# Patient Record
Sex: Male | Born: 1970 | Race: Black or African American | Hispanic: No | Marital: Married | Smoking: Never smoker
Health system: Southern US, Community
[De-identification: ages and names within clinical notes are randomized; demographics above are authoritative.]

## PROBLEM LIST (undated history)

## (undated) DIAGNOSIS — I4891 Unspecified atrial fibrillation: Secondary | ICD-10-CM

## (undated) DIAGNOSIS — L0390 Cellulitis, unspecified: Secondary | ICD-10-CM

## (undated) DIAGNOSIS — K219 Gastro-esophageal reflux disease without esophagitis: Secondary | ICD-10-CM

## (undated) DIAGNOSIS — K2970 Gastritis, unspecified, without bleeding: Secondary | ICD-10-CM

## (undated) DIAGNOSIS — K529 Noninfective gastroenteritis and colitis, unspecified: Secondary | ICD-10-CM

## (undated) DIAGNOSIS — N186 End stage renal disease: Secondary | ICD-10-CM

## (undated) DIAGNOSIS — M5030 Other cervical disc degeneration, unspecified cervical region: Secondary | ICD-10-CM

## (undated) DIAGNOSIS — N400 Enlarged prostate without lower urinary tract symptoms: Secondary | ICD-10-CM

## (undated) DIAGNOSIS — I509 Heart failure, unspecified: Secondary | ICD-10-CM

## (undated) DIAGNOSIS — I619 Nontraumatic intracerebral hemorrhage, unspecified: Secondary | ICD-10-CM

## (undated) DIAGNOSIS — I3139 Other pericardial effusion (noninflammatory): Secondary | ICD-10-CM

## (undated) DIAGNOSIS — Z7902 Long term (current) use of antithrombotics/antiplatelets: Secondary | ICD-10-CM

## (undated) DIAGNOSIS — E785 Hyperlipidemia, unspecified: Secondary | ICD-10-CM

## (undated) DIAGNOSIS — N189 Chronic kidney disease, unspecified: Secondary | ICD-10-CM

## (undated) DIAGNOSIS — Z79899 Other long term (current) drug therapy: Secondary | ICD-10-CM

## (undated) DIAGNOSIS — I639 Cerebral infarction, unspecified: Secondary | ICD-10-CM

## (undated) DIAGNOSIS — K209 Esophagitis, unspecified without bleeding: Secondary | ICD-10-CM

## (undated) DIAGNOSIS — E119 Type 2 diabetes mellitus without complications: Secondary | ICD-10-CM

## (undated) DIAGNOSIS — I219 Acute myocardial infarction, unspecified: Secondary | ICD-10-CM

## (undated) DIAGNOSIS — Z9001 Acquired absence of eye: Secondary | ICD-10-CM

## (undated) DIAGNOSIS — I6789 Other cerebrovascular disease: Secondary | ICD-10-CM

## (undated) DIAGNOSIS — R002 Palpitations: Secondary | ICD-10-CM

## (undated) DIAGNOSIS — Z7901 Long term (current) use of anticoagulants: Secondary | ICD-10-CM

## (undated) DIAGNOSIS — Z992 Dependence on renal dialysis: Secondary | ICD-10-CM

## (undated) DIAGNOSIS — M25572 Pain in left ankle and joints of left foot: Secondary | ICD-10-CM

## (undated) DIAGNOSIS — Z87442 Personal history of urinary calculi: Secondary | ICD-10-CM

## (undated) DIAGNOSIS — G8929 Other chronic pain: Secondary | ICD-10-CM

## (undated) DIAGNOSIS — E1140 Type 2 diabetes mellitus with diabetic neuropathy, unspecified: Secondary | ICD-10-CM

## (undated) DIAGNOSIS — R569 Unspecified convulsions: Secondary | ICD-10-CM

## (undated) DIAGNOSIS — R2681 Unsteadiness on feet: Secondary | ICD-10-CM

## (undated) DIAGNOSIS — I499 Cardiac arrhythmia, unspecified: Secondary | ICD-10-CM

## (undated) DIAGNOSIS — K3184 Gastroparesis: Secondary | ICD-10-CM

## (undated) DIAGNOSIS — D631 Anemia in chronic kidney disease: Secondary | ICD-10-CM

## (undated) DIAGNOSIS — M869 Osteomyelitis, unspecified: Secondary | ICD-10-CM

## (undated) DIAGNOSIS — R296 Repeated falls: Secondary | ICD-10-CM

## (undated) DIAGNOSIS — I10 Essential (primary) hypertension: Secondary | ICD-10-CM

## (undated) DIAGNOSIS — R112 Nausea with vomiting, unspecified: Secondary | ICD-10-CM

## (undated) DIAGNOSIS — I2510 Atherosclerotic heart disease of native coronary artery without angina pectoris: Secondary | ICD-10-CM

## (undated) DIAGNOSIS — G4700 Insomnia, unspecified: Secondary | ICD-10-CM

## (undated) DIAGNOSIS — M25571 Pain in right ankle and joints of right foot: Secondary | ICD-10-CM

## (undated) DIAGNOSIS — F419 Anxiety disorder, unspecified: Secondary | ICD-10-CM

## (undated) HISTORY — DX: Type 2 diabetes mellitus without complications: E11.9

## (undated) HISTORY — DX: Chronic kidney disease, unspecified: N18.9

## (undated) HISTORY — PX: EYE SURGERY: SHX253

## (undated) HISTORY — DX: Heart failure, unspecified: I50.9

## (undated) HISTORY — DX: Anxiety disorder, unspecified: F41.9

## (undated) HISTORY — PX: TONSILLECTOMY: SUR1361

## (undated) HISTORY — PX: ARTHROSCOPIC REPAIR ACL: SUR80

## (undated) HISTORY — PX: TONSILECTOMY/ADENOIDECTOMY WITH MYRINGOTOMY: SHX6125

---

## 2004-05-31 ENCOUNTER — Emergency Department: Payer: Self-pay | Admitting: Internal Medicine

## 2005-05-10 ENCOUNTER — Other Ambulatory Visit: Payer: Self-pay

## 2005-05-18 ENCOUNTER — Inpatient Hospital Stay: Payer: Self-pay | Admitting: Otolaryngology

## 2005-11-05 ENCOUNTER — Ambulatory Visit: Payer: Self-pay | Admitting: Family Medicine

## 2005-11-29 ENCOUNTER — Ambulatory Visit: Payer: Self-pay | Admitting: Family Medicine

## 2006-03-31 ENCOUNTER — Emergency Department: Payer: Self-pay

## 2006-11-08 ENCOUNTER — Ambulatory Visit: Payer: Self-pay | Admitting: Urology

## 2006-11-08 ENCOUNTER — Other Ambulatory Visit: Payer: Self-pay

## 2006-11-14 ENCOUNTER — Ambulatory Visit: Payer: Self-pay | Admitting: Urology

## 2007-03-20 ENCOUNTER — Encounter (HOSPITAL_COMMUNITY): Admission: RE | Admit: 2007-03-20 | Discharge: 2007-04-19 | Payer: Self-pay | Admitting: Family Medicine

## 2007-05-20 ENCOUNTER — Emergency Department (HOSPITAL_COMMUNITY): Admission: EM | Admit: 2007-05-20 | Discharge: 2007-05-20 | Payer: Self-pay | Admitting: Emergency Medicine

## 2007-11-14 ENCOUNTER — Emergency Department (HOSPITAL_COMMUNITY): Admission: EM | Admit: 2007-11-14 | Discharge: 2007-11-14 | Payer: Self-pay | Admitting: Emergency Medicine

## 2007-12-04 ENCOUNTER — Emergency Department: Payer: Self-pay | Admitting: Emergency Medicine

## 2008-11-10 ENCOUNTER — Emergency Department (HOSPITAL_COMMUNITY): Admission: EM | Admit: 2008-11-10 | Discharge: 2008-11-11 | Payer: Self-pay | Admitting: Emergency Medicine

## 2008-11-13 ENCOUNTER — Emergency Department (HOSPITAL_COMMUNITY): Admission: EM | Admit: 2008-11-13 | Discharge: 2008-11-13 | Payer: Self-pay | Admitting: Emergency Medicine

## 2010-03-02 ENCOUNTER — Emergency Department: Payer: Self-pay | Admitting: Emergency Medicine

## 2012-07-14 ENCOUNTER — Ambulatory Visit: Payer: Self-pay

## 2013-01-09 ENCOUNTER — Encounter (INDEPENDENT_AMBULATORY_CARE_PROVIDER_SITE_OTHER): Payer: No Typology Code available for payment source | Admitting: Ophthalmology

## 2013-01-09 DIAGNOSIS — H43819 Vitreous degeneration, unspecified eye: Secondary | ICD-10-CM

## 2013-01-09 DIAGNOSIS — H3581 Retinal edema: Secondary | ICD-10-CM

## 2013-01-09 DIAGNOSIS — E11319 Type 2 diabetes mellitus with unspecified diabetic retinopathy without macular edema: Secondary | ICD-10-CM

## 2013-01-09 DIAGNOSIS — H35039 Hypertensive retinopathy, unspecified eye: Secondary | ICD-10-CM

## 2013-01-09 DIAGNOSIS — I10 Essential (primary) hypertension: Secondary | ICD-10-CM

## 2013-01-09 DIAGNOSIS — E1039 Type 1 diabetes mellitus with other diabetic ophthalmic complication: Secondary | ICD-10-CM

## 2013-01-10 ENCOUNTER — Encounter (INDEPENDENT_AMBULATORY_CARE_PROVIDER_SITE_OTHER): Payer: No Typology Code available for payment source | Admitting: Ophthalmology

## 2013-01-10 DIAGNOSIS — H3581 Retinal edema: Secondary | ICD-10-CM

## 2013-01-15 ENCOUNTER — Other Ambulatory Visit (INDEPENDENT_AMBULATORY_CARE_PROVIDER_SITE_OTHER): Payer: No Typology Code available for payment source | Admitting: Ophthalmology

## 2013-01-15 DIAGNOSIS — E1139 Type 2 diabetes mellitus with other diabetic ophthalmic complication: Secondary | ICD-10-CM

## 2013-01-15 DIAGNOSIS — H3581 Retinal edema: Secondary | ICD-10-CM

## 2013-05-15 ENCOUNTER — Ambulatory Visit (INDEPENDENT_AMBULATORY_CARE_PROVIDER_SITE_OTHER): Payer: No Typology Code available for payment source | Admitting: Ophthalmology

## 2014-02-22 HISTORY — PX: PROSTATE SURGERY: SHX751

## 2014-05-20 ENCOUNTER — Emergency Department (HOSPITAL_COMMUNITY)
Admission: EM | Admit: 2014-05-20 | Discharge: 2014-05-20 | Disposition: A | Discharge status: Home / Self Care | Payer: BLUE CROSS/BLUE SHIELD | Attending: Emergency Medicine | Admitting: Emergency Medicine

## 2014-05-20 ENCOUNTER — Emergency Department (HOSPITAL_COMMUNITY): Payer: BLUE CROSS/BLUE SHIELD

## 2014-05-20 ENCOUNTER — Encounter (HOSPITAL_COMMUNITY): Payer: Self-pay

## 2014-05-20 DIAGNOSIS — Y92810 Car as the place of occurrence of the external cause: Secondary | ICD-10-CM | POA: Diagnosis not present

## 2014-05-20 DIAGNOSIS — W228XXA Striking against or struck by other objects, initial encounter: Secondary | ICD-10-CM | POA: Insufficient documentation

## 2014-05-20 DIAGNOSIS — Y9389 Activity, other specified: Secondary | ICD-10-CM | POA: Insufficient documentation

## 2014-05-20 DIAGNOSIS — S060X0A Concussion without loss of consciousness, initial encounter: Secondary | ICD-10-CM

## 2014-05-20 DIAGNOSIS — E1165 Type 2 diabetes mellitus with hyperglycemia: Secondary | ICD-10-CM | POA: Diagnosis not present

## 2014-05-20 DIAGNOSIS — S0990XA Unspecified injury of head, initial encounter: Secondary | ICD-10-CM | POA: Diagnosis present

## 2014-05-20 DIAGNOSIS — Y998 Other external cause status: Secondary | ICD-10-CM | POA: Insufficient documentation

## 2014-05-20 DIAGNOSIS — S0003XA Contusion of scalp, initial encounter: Secondary | ICD-10-CM | POA: Insufficient documentation

## 2014-05-20 DIAGNOSIS — R739 Hyperglycemia, unspecified: Secondary | ICD-10-CM

## 2014-05-20 DIAGNOSIS — Z79899 Other long term (current) drug therapy: Secondary | ICD-10-CM | POA: Insufficient documentation

## 2014-05-20 HISTORY — DX: Type 2 diabetes mellitus without complications: E11.9

## 2014-05-20 LAB — I-STAT CHEM 8, ED
BUN: 13 mg/dL (ref 6–23)
CHLORIDE: 101 mmol/L (ref 96–112)
Calcium, Ion: 1.23 mmol/L (ref 1.12–1.23)
Creatinine, Ser: 0.9 mg/dL (ref 0.50–1.35)
Glucose, Bld: 365 mg/dL — ABNORMAL HIGH (ref 70–99)
HEMATOCRIT: 47 % (ref 39.0–52.0)
Hemoglobin: 16 g/dL (ref 13.0–17.0)
POTASSIUM: 4.3 mmol/L (ref 3.5–5.1)
Sodium: 138 mmol/L (ref 135–145)
TCO2: 22 mmol/L (ref 0–100)

## 2014-05-20 LAB — CBC
HEMATOCRIT: 41.8 % (ref 39.0–52.0)
HEMOGLOBIN: 13.4 g/dL (ref 13.0–17.0)
MCH: 23.1 pg — AB (ref 26.0–34.0)
MCHC: 32.1 g/dL (ref 30.0–36.0)
MCV: 72.2 fL — AB (ref 78.0–100.0)
Platelets: 251 10*3/uL (ref 150–400)
RBC: 5.79 MIL/uL (ref 4.22–5.81)
RDW: 13.4 % (ref 11.5–15.5)
WBC: 8.1 10*3/uL (ref 4.0–10.5)

## 2014-05-20 LAB — CBG MONITORING, ED: GLUCOSE-CAPILLARY: 302 mg/dL — AB (ref 70–99)

## 2014-05-20 MED ORDER — HYDROCODONE-ACETAMINOPHEN 5-325 MG PO TABS: 1.0000 | ORAL_TABLET | Freq: Four times a day (QID) | ORAL | Status: DC | PRN

## 2014-05-20 MED ORDER — ONDANSETRON 4 MG PO TBDP
8.0000 mg | ORAL_TABLET | Freq: Once | ORAL | Status: AC
  Administered 2014-05-20: 8 mg via ORAL
  Filled 2014-05-20: qty 2

## 2014-05-20 MED ORDER — PROMETHAZINE HCL 25 MG PO TABS: 25.0000 mg | ORAL_TABLET | Freq: Four times a day (QID) | ORAL | Status: DC | PRN

## 2014-05-20 MED ORDER — HYDROCODONE-ACETAMINOPHEN 5-325 MG PO TABS
2.0000 | ORAL_TABLET | Freq: Once | ORAL | Status: AC
Start: 2014-05-20 — End: 2014-05-20
  Administered 2014-05-20: 2 via ORAL
  Filled 2014-05-20: qty 2

## 2014-05-20 NOTE — ED Notes (Signed)
Pt alert and oriented, answering questions appropriately in triage.

## 2014-05-20 NOTE — ED Provider Notes (Addendum)
CSN: [id number]     Arrival date & time 05/20/14  1143 History   First MD Initiated Contact with Patient 05/20/14 1348     Chief Complaint  Patient presents with  . Head Injury     (Consider location/radiation/quality/duration/timing/severity/associated sxs/prior Treatment) Patient is a 44 y.o. male presenting with head injury. The history is provided by the patient.  Head Injury Associated symptoms: headache and nausea   Associated symptoms: no neck pain and no numbness    patient with head injury on Friday, stood up in car and whacked back of his head. Did not pass out but did drop to his knee was very dazed but confused. Since since had persistent headache some nausea, drowsiness, dizziness.  Past Medical History  Diagnosis Date  . Diabetes mellitus without complication    History reviewed. No pertinent past surgical history. History reviewed. No pertinent family history. History  Substance Use Topics  . Smoking status: Never Smoker   . Smokeless tobacco: Not on file  . Alcohol Use: No    Review of Systems  Constitutional: Negative for fever.  HENT: Negative for congestion.   Eyes: Positive for visual disturbance. Negative for photophobia and redness.  Respiratory: Negative for shortness of breath.   Gastrointestinal: Positive for nausea.  Genitourinary: Negative for hematuria.  Musculoskeletal: Negative for back pain and neck pain.  Skin: Negative for rash.  Neurological: Positive for dizziness, light-headedness and headaches. Negative for weakness and numbness.  Psychiatric/Behavioral: Negative for confusion.      Allergies  Review of patient's allergies indicates no known allergies.  Home Medications   Prior to Admission medications   Medication Sig Start Date End Date Taking? Authorizing Provider  lisinopril-hydrochlorothiazide (PRINZIDE,ZESTORETIC) 20-25 MG per tablet Take 1 tablet by mouth daily. 04/16/14  Yes Historical Provider, MD  metFORMIN (GLUCOPHAGE)  1000 MG tablet Take 1,000 mg by mouth 2 (two) times daily. 04/16/14  Yes Historical Provider, MD  ranitidine (ZANTAC) 150 MG tablet Take 150 mg by mouth 2 (two) times daily.   Yes Historical Provider, MD   BP 144/96 mmHg  Pulse 80  Temp(Src) 98.2 F (36.8 C) (Oral)  Resp 18  Ht 6\' 3"  (1.905 m)  Wt 225 lb (102.059 kg)  BMI 28.12 kg/m2  SpO2 95% Physical Exam  Constitutional: He is oriented to person, place, and time. He appears well-developed and well-nourished.  HENT:  Mouth/Throat: Oropharynx is clear and moist.  Old well-healed scar to the occiput part of the scalp. On top of the scalp there is some small area of tenderness and hematoma without any redness or laceration measuring about 3 cm in diameter.  Eyes: Conjunctivae and EOM are normal. Pupils are equal, round, and reactive to light.  Neck: Normal range of motion. Neck supple.  Cardiovascular: Normal rate, regular rhythm and normal heart sounds.   No murmur heard. Pulmonary/Chest: Effort normal and breath sounds normal. No respiratory distress.  Abdominal: Soft. Bowel sounds are normal. There is no tenderness.  Musculoskeletal: Normal range of motion.  Neurological: He is alert and oriented to person, place, and time. No cranial nerve deficit. He exhibits normal muscle tone. Coordination normal.  Skin: Skin is warm. No rash noted.  Nursing note and vitals reviewed.   ED Course  Procedures (including critical care time) Labs Review Labs Reviewed  CBC - Abnormal; Notable for the following:    MCV 72.2 (*)    MCH 23.1 (*)    All other components within normal limits  CBG MONITORING, ED -  Abnormal; Notable for the following:    Glucose-Capillary 302 (*)    All other components within normal limits  I-STAT CHEM 8, ED - Abnormal; Notable for the following:    Glucose, Bld 365 (*)    All other components within normal limits   Results for orders placed or performed during the hospital encounter of 05/20/14  CBC  (at [facility]  and [facility])  Result Value Ref Range   WBC 8.1 4.0 - 10.5 K/uL   RBC 5.79 4.22 - 5.81 MIL/uL   Hemoglobin 13.4 13.0 - 17.0 g/dL   HCT 41.8 39.0 - 52.0 %   MCV 72.2 (L) 78.0 - 100.0 fL   MCH 23.1 (L) 26.0 - 34.0 pg   MCHC 32.1 30.0 - 36.0 g/dL   RDW 13.4 11.5 - 15.5 %   Platelets 251 150 - 400 K/uL  CBG, ED  Result Value Ref Range   Glucose-Capillary 302 (H) 70 - 99 mg/dL   Comment 1 Notify RN   I-Stat Chem 8, ED  Result Value Ref Range   Sodium 138 135 - 145 mmol/L   Potassium 4.3 3.5 - 5.1 mmol/L   Chloride 101 96 - 112 mmol/L   BUN 13 6 - 23 mg/dL   Creatinine, Ser 0.90 0.50 - 1.35 mg/dL   Glucose, Bld 365 (H) 70 - 99 mg/dL   Calcium, Ion 1.23 1.12 - 1.23 mmol/L   TCO2 22 0 - 100 mmol/L   Hemoglobin 16.0 13.0 - 17.0 g/dL   HCT 47.0 39.0 - 52.0 %     Imaging Review No results found.   EKG Interpretation None      MDM   Final diagnoses:  Head injury, initial encounter  Concussion, without loss of consciousness, initial encounter    Patient with known history of diabetes. Patient states he is taking his metformin. Patient with a head injury on Friday at the back of his head and on top of the car when he stood up quickly. Patient dropped to his knees but did not pass out. Felt dazed disease had a headache some nausea and vomiting and been very drowsy since that time. Patient works night shift.  Head CT is been ordered if negative patient we discharged home with head injury and concussion patient will be given a work note to be out of work for the next several days. Patient will also be treated with pain medicine and antinausea medicine.   Fredia Sorrow, MD 05/20/14 1548  Head CT negative. Consistent with a concussion. Will treat symptomatically. And with rest.  Fredia Sorrow, MD 05/20/14 [phone]

## 2014-05-20 NOTE — Discharge Instructions (Signed)
Concussion A concussion is a brain injury. It is caused by:  A hit to the head.  A quick and sudden movement (jolt) of the head or neck. A concussion is usually not life threatening. Even so, it can cause serious problems. If you had a concussion before, you may have concussion-like problems after a hit to your head. HOME CARE General Instructions  Follow your doctor's directions carefully.  Take medicines only as told by your doctor.  Only take medicines your doctor says are safe.  Do not drink alcohol until your doctor says it is okay. Alcohol and some drugs can slow down healing. They can also put you at risk for further injury.  If you are having trouble remembering things, write them down.  Try to do one thing at a time if you get distracted easily. For example, do not watch TV while making dinner.  Talk to your family members or close friends when making important decisions.  Follow up with your doctor as told.  Watch your symptoms. Tell others to do the same. Serious problems can sometimes happen after a concussion. Older adults are more likely to have these problems.  Tell your teachers, school nurse, school counselor, coach, Product/process development scientist, or work Freight forwarder about your concussion. Tell them about what you can or cannot do. They should watch to see if:  It gets even harder for you to pay attention or concentrate.  It gets even harder for you to remember things or learn new things.  You need more time than normal to finish things.  You become annoyed (irritable) more than before.  You are not able to deal with stress as well.  You have more problems than before.  Rest. Make sure you:  Get plenty of sleep at night.  Go to sleep early.  Go to bed at the same time every day. Try to wake up at the same time.  Rest during the day.  Take naps when you feel tired.  Limit activities where you have to think a lot or concentrate. These include:  Doing  homework.  Doing work related to a job.  Watching TV.  Using the computer. Returning To Your Regular Activities Return to your normal activities slowly, not all at once. You must give your body and brain enough time to heal.   Do not play sports or do other athletic activities until your doctor says it is okay.  Ask your doctor when you can drive, ride a bicycle, or work other vehicles or machines. Never do these things if you feel dizzy.  Ask your doctor about when you can return to work or school. Preventing Another Concussion It is very important to avoid another brain injury, especially before you have healed. In rare cases, another injury can lead to permanent brain damage, brain swelling, or death. The risk of this is greatest during the first 7-10 days after your injury. Avoid injuries by:   Wearing a seat belt when riding in a car.  Not drinking too much alcohol.  Avoiding activities that could lead to a second concussion (such as contact sports).  Wearing a helmet when doing activities like:  Biking.  Skiing.  Skateboarding.  Skating.  Making your home safer by:  Removing things from the floor or stairways that could make you trip.  Using grab bars in bathrooms and handrails by stairs.  Placing non-slip mats on floors and in bathtubs.  Improve lighting in dark areas. GET HELP IF:  It  gets even harder for you to pay attention or concentrate.  It gets even harder for you to remember things or learn new things.  You need more time than normal to finish things.  You become annoyed (irritable) more than before.  You are not able to deal with stress as well.  You have more problems than before.  You have problems keeping your balance.  You are not able to react quickly when you should. Get help if you have any of these problems for more than 2 weeks:   Lasting (chronic) headaches.  Dizziness or trouble balancing.  Feeling sick to your stomach  (nausea).  Seeing (vision) problems.  Being affected by noises or light more than normal.  Feeling sad, low, down in the dumps, blue, gloomy, or empty (depressed).  Mood changes (mood swings).  Feeling of fear or nervousness about what may happen (anxiety).  Feeling annoyed.  Memory problems.  Problems concentrating or paying attention.  Sleep problems.  Feeling tired all the time. GET HELP RIGHT AWAY IF:   You have bad headaches or your headaches get worse.  You have weakness (even if it is in one hand, leg, or part of the face).  You have loss of feeling (numbness).  You feel off balance.  You keep throwing up (vomiting).  You feel tired.  One black center of your eye (pupil) is larger than the other.  You twitch or shake violently (convulse).  Your speech is not clear (slurred).  You are more confused, easily angered (agitated), or annoyed than before.  You have more trouble resting than before.  You are unable to recognize people or places.  You have neck pain.  It is difficult to wake you up.  You have unusual behavior changes.  You pass out (lose consciousness). MAKE SURE YOU:   Understand these instructions.  Will watch your condition.  Will get help right away if you are not doing well or get worse. Document Released: 01/27/2009 Document Revised: 06/25/2013 Document Reviewed: 08/31/2012 [facility] Patient Information 2015 [place], [place]. This information is not intended to replace advice given to you by your health care provider. Make sure you discuss any questions you have with your health care provider.  Work note provided. Go home and rest. Take pain medicine as needed. Take antinausea medicine as needed. Return for any new or worse symptoms. Head CT without any significant findings. Blood sugar is elevated a little bit here today will need to follow-up with your doctor to make sure that that's under better control.

## 2014-05-20 NOTE — ED Notes (Signed)
Pt states he has been lethargic since he hit his head.

## 2014-05-20 NOTE — ED Notes (Signed)
Pt is in stable condition upon d/c and ambulates from ED. 

## 2014-05-20 NOTE — ED Notes (Signed)
Pt reports head inj on Friday and all week end was tired, pt sts slept on a road trip, reprots headache, family sts he has been lethargic since incident per son.

## 2015-02-13 ENCOUNTER — Encounter (INDEPENDENT_AMBULATORY_CARE_PROVIDER_SITE_OTHER): Payer: Self-pay | Admitting: Ophthalmology

## 2015-03-06 ENCOUNTER — Emergency Department: Payer: BLUE CROSS/BLUE SHIELD

## 2015-03-06 ENCOUNTER — Encounter: Payer: Self-pay | Admitting: Radiology

## 2015-03-06 ENCOUNTER — Inpatient Hospital Stay
Admission: EM | Admit: 2015-03-06 | Discharge: 2015-03-10 | DRG: 872 | Disposition: A | Discharge status: Home / Self Care | Payer: BLUE CROSS/BLUE SHIELD | Attending: Internal Medicine | Admitting: Internal Medicine

## 2015-03-06 DIAGNOSIS — L03211 Cellulitis of face: Secondary | ICD-10-CM

## 2015-03-06 DIAGNOSIS — Z888 Allergy status to other drugs, medicaments and biological substances status: Secondary | ICD-10-CM

## 2015-03-06 DIAGNOSIS — E1165 Type 2 diabetes mellitus with hyperglycemia: Secondary | ICD-10-CM | POA: Diagnosis present

## 2015-03-06 DIAGNOSIS — E871 Hypo-osmolality and hyponatremia: Secondary | ICD-10-CM | POA: Diagnosis present

## 2015-03-06 DIAGNOSIS — Z7984 Long term (current) use of oral hypoglycemic drugs: Secondary | ICD-10-CM

## 2015-03-06 DIAGNOSIS — A419 Sepsis, unspecified organism: Principal | ICD-10-CM | POA: Diagnosis present

## 2015-03-06 DIAGNOSIS — I10 Essential (primary) hypertension: Secondary | ICD-10-CM | POA: Diagnosis present

## 2015-03-06 DIAGNOSIS — Z79899 Other long term (current) drug therapy: Secondary | ICD-10-CM

## 2015-03-06 HISTORY — DX: Essential (primary) hypertension: I10

## 2015-03-06 LAB — CBC
HEMATOCRIT: 44.5 % (ref 40.0–52.0)
Hemoglobin: 13.9 g/dL (ref 13.0–18.0)
MCH: 21.9 pg — ABNORMAL LOW (ref 26.0–34.0)
MCHC: 31.2 g/dL — AB (ref 32.0–36.0)
MCV: 70.1 fL — ABNORMAL LOW (ref 80.0–100.0)
Platelets: 266 10*3/uL (ref 150–440)
RBC: 6.36 MIL/uL — ABNORMAL HIGH (ref 4.40–5.90)
RDW: 13.4 % (ref 11.5–14.5)
WBC: 17.4 10*3/uL — ABNORMAL HIGH (ref 3.8–10.6)

## 2015-03-06 LAB — COMPREHENSIVE METABOLIC PANEL
ALBUMIN: 3.1 g/dL — AB (ref 3.5–5.0)
ALT: 15 U/L — ABNORMAL LOW (ref 17–63)
ANION GAP: 11 (ref 5–15)
AST: 14 U/L — ABNORMAL LOW (ref 15–41)
Alkaline Phosphatase: 99 U/L (ref 38–126)
BILIRUBIN TOTAL: 1.3 mg/dL — AB (ref 0.3–1.2)
BUN: 20 mg/dL (ref 6–20)
CO2: 26 mmol/L (ref 22–32)
Calcium: 8.7 mg/dL — ABNORMAL LOW (ref 8.9–10.3)
Chloride: 91 mmol/L — ABNORMAL LOW (ref 101–111)
Creatinine, Ser: 1.54 mg/dL — ABNORMAL HIGH (ref 0.61–1.24)
GFR calc Af Amer: >60 mL/min (ref >60)
GFR, EST NON AFRICAN AMERICAN: 52 mL/min — AB (ref >60)
GLUCOSE: 599 mg/dL — AB (ref 65–99)
POTASSIUM: 3.8 mmol/L (ref 3.5–5.1)
Sodium: 128 mmol/L — ABNORMAL LOW (ref 135–145)
TOTAL PROTEIN: 8.2 g/dL — AB (ref 6.5–8.1)

## 2015-03-06 LAB — GLUCOSE, CAPILLARY: GLUCOSE-CAPILLARY: 510 mg/dL — AB (ref 65–99)

## 2015-03-06 MED ORDER — SODIUM CHLORIDE 0.9 % IV BOLUS (SEPSIS)
1000.0000 mL | Freq: Once | INTRAVENOUS | Status: AC
  Administered 2015-03-06: 1000 mL via INTRAVENOUS

## 2015-03-06 MED ORDER — VANCOMYCIN HCL IN DEXTROSE 1-5 GM/200ML-% IV SOLN
1000.0000 mg | Freq: Once | INTRAVENOUS | Status: AC
  Administered 2015-03-06: 1000 mg via INTRAVENOUS
  Filled 2015-03-06: qty 200

## 2015-03-06 MED ORDER — INSULIN ASPART 100 UNIT/ML ~~LOC~~ SOLN
10.0000 [IU] | Freq: Once | SUBCUTANEOUS | Status: AC
  Administered 2015-03-06: 10 [IU] via INTRAVENOUS
  Filled 2015-03-06: qty 10

## 2015-03-06 MED ORDER — DIPHENHYDRAMINE HCL 50 MG/ML IJ SOLN: 50.0000 mg | Freq: Once | INTRAMUSCULAR | Status: DC

## 2015-03-06 MED ORDER — METHYLPREDNISOLONE SODIUM SUCC 125 MG IJ SOLR: 125.0000 mg | Freq: Once | INTRAMUSCULAR | Status: DC

## 2015-03-06 MED ORDER — IOHEXOL 300 MG/ML  SOLN
75.0000 mL | Freq: Once | INTRAMUSCULAR | Status: AC | PRN
  Administered 2015-03-06: 75 mL via INTRAVENOUS

## 2015-03-06 MED ORDER — FAMOTIDINE IN NACL 20-0.9 MG/50ML-% IV SOLN: 20.0000 mg | Freq: Once | INTRAVENOUS | Status: DC

## 2015-03-06 NOTE — ED Notes (Signed)
Patient transported to CT 

## 2015-03-06 NOTE — ED Notes (Signed)
Pt in with co swelling to face was started on levaquin yesterday for sinus infection.

## 2015-03-06 NOTE — ED Notes (Signed)
Reviewed d/c instructions, prescription, and pain management with patient. Gave pt work note. Pt verbalized understanding.

## 2015-03-06 NOTE — ED Provider Notes (Signed)
[facility] Emergency Department Provider Note  Time seen: 10:04 PM  I have reviewed the triage vital signs and the nursing notes.   HISTORY  Chief Complaint Allergic Reaction    HPI [name]. is a 45 y.o. male with a past medical history of hypertension and diabetes who presents the emergency department with facial swelling. According to the patientfor the past 4 days he has been having pain in his face and sinuses. Initially prescribed amoxicillin by his primary care physician 4 days ago for a sinus infection. 2 days ago he states worsening pain in the primary care physician switched him to [place]. Patient states beginning yesterday he began having swelling of his face and nose which has worsened acutely today. He is also having significant pain in the area breaking out in chills at home. Has not taken a temperature. Denies any vomiting, but states he's been having nausea and diarrhea today. Describes as facial pain as moderate to severe.     Past Medical History  Diagnosis Date  . Diabetes mellitus without complication     There are no active problems to display for this patient.   No past surgical history on file.  Current Outpatient Rx  Name  Route  Sig  Dispense  Refill  . HYDROcodone-acetaminophen (NORCO/VICODIN) 5-325 MG per tablet   Oral   Take 1-2 tablets by mouth every 6 (six) hours as needed for moderate pain.   20 tablet   0   . lisinopril-hydrochlorothiazide (PRINZIDE,ZESTORETIC) 20-25 MG per tablet   Oral   Take 1 tablet by mouth daily.      1   . metFORMIN (GLUCOPHAGE) 1000 MG tablet   Oral   Take 1,000 mg by mouth 2 (two) times daily.      1   . promethazine (PHENERGAN) 25 MG tablet   Oral   Take 1 tablet (25 mg total) by mouth every 6 (six) hours as needed for nausea or vomiting.   12 tablet   1   . ranitidine (ZANTAC) 150 MG tablet   Oral   Take 150 mg by mouth 2 (two) times daily.            Allergies Levaquin  No family history on file.  Social History Social History  Substance Use Topics  . Smoking status: Never Smoker   . Smokeless tobacco: Not on file  . Alcohol Use: No    Review of Systems Constitutional: Positive for chills. Eyes: Negative for visual changes. ENT: Facial pain, swelling of his nose, face and eyelids. Cardiovascular: Negative for chest pain. Respiratory: Negative for shortness of breath. Gastrointestinal: Negative for abdominal pain. Positive for nausea, negative for vomiting. Positive for diarrhea. Neurological: Negative for headache 10-point ROS otherwise negative.  ____________________________________________   PHYSICAL EXAM:  VITAL SIGNS: ED Triage Vitals  Enc Vitals Group     BP 03/06/15 2147 122/86 mmHg     Pulse Rate 03/06/15 2147 118     Resp 03/06/15 2147 18     Temp 03/06/15 2147 99.8 F (37.7 C)     Temp Source 03/06/15 2147 Oral     SpO2 03/06/15 2147 99 %     Weight 03/06/15 2147 223 lb (101.152 kg)     Height 03/06/15 2147 6\' 3"  (1.905 m)     Head Cir --      Peak Flow --      Pain Score 03/06/15 2148 10     Pain Loc --  Pain Edu? --      Excl. in [place]? --     Constitutional: Alert and oriented. Well appearing and in no distress. Eyes: Periorbital edema, EOMI, normal appearing eyes otherwise. ENT   Head: Normocephalic and atraumatic.   Nose: Moderate nasal swelling with small lesion to the anterior nose, along with mild erythema. Significant tenderness over the nose and maxillary sinus areas.   Mouth/Throat: Mucous membranes are moist. No lip, tongue, or pharyngeal edema noted Cardiovascular: Normal rate, regular rhythm. No murmur Respiratory: Normal respiratory effort without tachypnea nor retractions. Breath sounds are clear and equal bilaterally. No wheezes/rales/rhonchi. Gastrointestinal: Soft and nontender. No distention.  Musculoskeletal: Nontender with normal range of motion in all  extremities.  Neurologic:  Normal speech and language. No gross focal neurologic deficits  Skin:  Skin is warm, dry and intact.  Psychiatric: Mood and affect are normal. Speech and behavior are normal. ____________________________________________    RADIOLOGY  CT scan negative  ____________________________________________    INITIAL IMPRESSION / ASSESSMENT AND PLAN / ED COURSE  Pertinent labs & imaging results that were available during my care of the patient were reviewed by me and considered in my medical decision making (see chart for details).  Patient presents for facial swelling, pain and erythema. Patient has a temperature of 99.8 in the emergency department with a heart rate of 118. Patient's examination does not appear to be consistent with an allergic reaction. Patient is on an ACE inhibitor, but his symptoms also do not appear consistent with angioedema. Given his significant sinus tenderness to percussion along with swelling, erythema and edema of the face, periorbital areas in nose, believe his exam would be most consistent with facial cellulitis versus invasive sinusitis. I will proceed with labs, blood cultures, IV vancomycin, and obtain a CT with contrast of the maxillofacial area to further evaluate.  Gen. elevated white blood cell count, as well as a significantly elevated glucose greater than 500. We'll continue IV hydration, a dose 10 units of IV insulin, currently awaiting CT results.  CT scan negative. Significantly elevated leukocytosis along with significant hyperglycemia and an exam most consistent with facial cellulitis, and a temperature to 99.8. We will admit to the hospital for continued IV antibiotics  ____________________________________________   FINAL CLINICAL IMPRESSION(S) / ED DIAGNOSES  Facial edema Facial cellulitis  Harvest Dark, MD 03/06/15 2330

## 2015-03-07 ENCOUNTER — Encounter: Payer: Self-pay | Admitting: Internal Medicine

## 2015-03-07 DIAGNOSIS — E1165 Type 2 diabetes mellitus with hyperglycemia: Secondary | ICD-10-CM | POA: Diagnosis present

## 2015-03-07 DIAGNOSIS — Z888 Allergy status to other drugs, medicaments and biological substances status: Secondary | ICD-10-CM | POA: Diagnosis not present

## 2015-03-07 DIAGNOSIS — Z7984 Long term (current) use of oral hypoglycemic drugs: Secondary | ICD-10-CM | POA: Diagnosis not present

## 2015-03-07 DIAGNOSIS — E871 Hypo-osmolality and hyponatremia: Secondary | ICD-10-CM | POA: Diagnosis present

## 2015-03-07 DIAGNOSIS — I10 Essential (primary) hypertension: Secondary | ICD-10-CM | POA: Diagnosis present

## 2015-03-07 DIAGNOSIS — L03211 Cellulitis of face: Secondary | ICD-10-CM | POA: Diagnosis present

## 2015-03-07 DIAGNOSIS — A419 Sepsis, unspecified organism: Secondary | ICD-10-CM | POA: Diagnosis present

## 2015-03-07 DIAGNOSIS — Z79899 Other long term (current) drug therapy: Secondary | ICD-10-CM | POA: Diagnosis not present

## 2015-03-07 LAB — GLUCOSE, CAPILLARY
Glucose-Capillary: 402 mg/dL — ABNORMAL HIGH (ref 65–99)
Glucose-Capillary: 336 mg/dL — ABNORMAL HIGH (ref 65–99)
Glucose-Capillary: 281 mg/dL — ABNORMAL HIGH (ref 65–99)
Glucose-Capillary: 276 mg/dL — ABNORMAL HIGH (ref 65–99)
Glucose-Capillary: 278 mg/dL — ABNORMAL HIGH (ref 65–99)

## 2015-03-07 LAB — HEMOGLOBIN A1C: Hgb A1c MFr Bld: 15.6 % — ABNORMAL HIGH (ref 4.0–6.0)

## 2015-03-07 LAB — TSH: TSH: 2.153 u[IU]/mL (ref 0.350–4.500)

## 2015-03-07 MED ORDER — PNEUMOCOCCAL VAC POLYVALENT 25 MCG/0.5ML IJ INJ
0.5000 mL | INJECTION | INTRAMUSCULAR | Status: AC
  Administered 2015-03-10: 08:00:00 0.5 mL via INTRAMUSCULAR
  Filled 2015-03-07: qty 0.5

## 2015-03-07 MED ORDER — FAMOTIDINE 20 MG PO TABS
20.0000 mg | ORAL_TABLET | Freq: Two times a day (BID) | ORAL | Status: DC
  Administered 2015-03-07 – 2015-03-10 (×7): 20 mg via ORAL
  Filled 2015-03-07 (×7): qty 1

## 2015-03-07 MED ORDER — SODIUM CHLORIDE 0.9 % IV SOLN
INTRAVENOUS | Status: DC
  Administered 2015-03-07 – 2015-03-08 (×4): via INTRAVENOUS

## 2015-03-07 MED ORDER — LISINOPRIL 20 MG PO TABS
20.0000 mg | ORAL_TABLET | Freq: Every day | ORAL | Status: DC
  Administered 2015-03-07 – 2015-03-10 (×4): 20 mg via ORAL
  Filled 2015-03-07 (×4): qty 1

## 2015-03-07 MED ORDER — HEPARIN SODIUM (PORCINE) 5000 UNIT/ML IJ SOLN
5000.0000 [IU] | Freq: Three times a day (TID) | INTRAMUSCULAR | Status: DC
  Administered 2015-03-07: 5000 [IU] via SUBCUTANEOUS
  Filled 2015-03-07: qty 1

## 2015-03-07 MED ORDER — DIPHENHYDRAMINE HCL 25 MG PO CAPS
25.0000 mg | ORAL_CAPSULE | Freq: Four times a day (QID) | ORAL | Status: DC | PRN
  Administered 2015-03-07: 25 mg via ORAL
  Filled 2015-03-07: qty 1

## 2015-03-07 MED ORDER — ENOXAPARIN SODIUM 40 MG/0.4ML ~~LOC~~ SOLN
40.0000 mg | SUBCUTANEOUS | Status: DC
  Administered 2015-03-07 – 2015-03-09 (×3): 40 mg via SUBCUTANEOUS
  Filled 2015-03-07 (×2): qty 0.4

## 2015-03-07 MED ORDER — LISINOPRIL-HYDROCHLOROTHIAZIDE 20-25 MG PO TABS: 1.0000 | ORAL_TABLET | Freq: Every day | ORAL | Status: DC

## 2015-03-07 MED ORDER — HYDROCHLOROTHIAZIDE 25 MG PO TABS
25.0000 mg | ORAL_TABLET | Freq: Every day | ORAL | Status: DC
  Administered 2015-03-07 – 2015-03-10 (×4): 25 mg via ORAL
  Filled 2015-03-07 (×4): qty 1

## 2015-03-07 MED ORDER — ACYCLOVIR 200 MG PO CAPS
400.0000 mg | ORAL_CAPSULE | Freq: Every day | ORAL | Status: DC
  Administered 2015-03-07: 400 mg via ORAL
  Filled 2015-03-07 (×6): qty 2

## 2015-03-07 MED ORDER — ONDANSETRON HCL 4 MG/2ML IJ SOLN
4.0000 mg | Freq: Four times a day (QID) | INTRAMUSCULAR | Status: DC | PRN
  Administered 2015-03-08 – 2015-03-10 (×4): 4 mg via INTRAVENOUS
  Filled 2015-03-07 (×4): qty 2

## 2015-03-07 MED ORDER — PROMETHAZINE HCL 25 MG PO TABS
25.0000 mg | ORAL_TABLET | Freq: Four times a day (QID) | ORAL | Status: DC | PRN
Start: 2015-03-07 — End: 2015-03-10

## 2015-03-07 MED ORDER — PIPERACILLIN-TAZOBACTAM 4.5 G IVPB
4.5000 g | Freq: Three times a day (TID) | INTRAVENOUS | Status: DC
Start: 2015-03-07 — End: 2015-03-09
  Administered 2015-03-07 – 2015-03-09 (×7): 4.5 g via INTRAVENOUS
  Filled 2015-03-07 (×10): qty 100

## 2015-03-07 MED ORDER — ACETAMINOPHEN 325 MG PO TABS: 650.0000 mg | ORAL_TABLET | Freq: Four times a day (QID) | ORAL | Status: DC | PRN

## 2015-03-07 MED ORDER — METFORMIN HCL 500 MG PO TABS: 1000.0000 mg | ORAL_TABLET | Freq: Two times a day (BID) | ORAL | Status: DC

## 2015-03-07 MED ORDER — ACYCLOVIR 400 MG PO TABS
400.0000 mg | ORAL_TABLET | Freq: Every day | ORAL | Status: DC
  Filled 2015-03-07 (×6): qty 1

## 2015-03-07 MED ORDER — INSULIN GLARGINE 100 UNIT/ML ~~LOC~~ SOLN
20.0000 [IU] | Freq: Every day | SUBCUTANEOUS | Status: DC
  Administered 2015-03-07 – 2015-03-10 (×4): 20 [IU] via SUBCUTANEOUS
  Filled 2015-03-07 (×6): qty 0.2

## 2015-03-07 MED ORDER — MORPHINE SULFATE (PF) 2 MG/ML IV SOLN
2.0000 mg | Freq: Once | INTRAVENOUS | Status: AC
  Administered 2015-03-07: 2 mg via INTRAVENOUS
  Filled 2015-03-07: qty 1

## 2015-03-07 MED ORDER — OXYCODONE-ACETAMINOPHEN 5-325 MG PO TABS
1.0000 | ORAL_TABLET | Freq: Four times a day (QID) | ORAL | Status: DC | PRN
  Administered 2015-03-07 – 2015-03-10 (×6): 1 via ORAL
  Filled 2015-03-07 (×7): qty 1

## 2015-03-07 MED ORDER — ACETAMINOPHEN 650 MG RE SUPP: 650.0000 mg | Freq: Four times a day (QID) | RECTAL | Status: DC | PRN

## 2015-03-07 MED ORDER — DOCUSATE SODIUM 100 MG PO CAPS
100.0000 mg | ORAL_CAPSULE | Freq: Two times a day (BID) | ORAL | Status: DC
  Administered 2015-03-07 – 2015-03-10 (×7): 100 mg via ORAL
  Filled 2015-03-07 (×7): qty 1

## 2015-03-07 MED ORDER — ACETAMINOPHEN 325 MG PO TABS
650.0000 mg | ORAL_TABLET | Freq: Once | ORAL | Status: AC
  Administered 2015-03-07: 650 mg via ORAL
  Filled 2015-03-07: qty 2

## 2015-03-07 MED ORDER — INSULIN ASPART 100 UNIT/ML ~~LOC~~ SOLN
0.0000 [IU] | Freq: Every day | SUBCUTANEOUS | Status: DC
  Administered 2015-03-07 – 2015-03-08 (×2): 3 [IU] via SUBCUTANEOUS
  Filled 2015-03-07 (×2): qty 3

## 2015-03-07 MED ORDER — MORPHINE SULFATE (PF) 2 MG/ML IV SOLN
2.0000 mg | INTRAVENOUS | Status: DC | PRN
  Administered 2015-03-07 – 2015-03-08 (×2): 2 mg via INTRAVENOUS
  Filled 2015-03-07 (×2): qty 1

## 2015-03-07 MED ORDER — ONDANSETRON HCL 4 MG PO TABS: 4.0000 mg | ORAL_TABLET | Freq: Four times a day (QID) | ORAL | Status: DC | PRN

## 2015-03-07 MED ORDER — INSULIN ASPART 100 UNIT/ML ~~LOC~~ SOLN
0.0000 [IU] | Freq: Three times a day (TID) | SUBCUTANEOUS | Status: DC
  Administered 2015-03-07: 12:00:00 8 [IU] via SUBCUTANEOUS
  Administered 2015-03-07: 08:00:00 11 [IU] via SUBCUTANEOUS
  Administered 2015-03-07: 9 [IU] via SUBCUTANEOUS
  Administered 2015-03-08: 08:00:00 11 [IU] via SUBCUTANEOUS
  Administered 2015-03-08: 5 [IU] via SUBCUTANEOUS
  Administered 2015-03-08: 13:00:00 8 [IU] via SUBCUTANEOUS
  Administered 2015-03-09: 5 [IU] via SUBCUTANEOUS
  Administered 2015-03-09: 12:00:00 8 [IU] via SUBCUTANEOUS
  Administered 2015-03-09 – 2015-03-10 (×3): 5 [IU] via SUBCUTANEOUS
  Filled 2015-03-07: qty 5
  Filled 2015-03-07: qty 8
  Filled 2015-03-07: qty 5
  Filled 2015-03-07: qty 11
  Filled 2015-03-07: qty 5
  Filled 2015-03-07 (×2): qty 8
  Filled 2015-03-07: qty 3
  Filled 2015-03-07: qty 5
  Filled 2015-03-07: qty 11
  Filled 2015-03-07: qty 8

## 2015-03-07 MED ORDER — VANCOMYCIN HCL 10 G IV SOLR
1500.0000 mg | Freq: Two times a day (BID) | INTRAVENOUS | Status: DC
  Administered 2015-03-07 – 2015-03-08 (×3): 1500 mg via INTRAVENOUS
  Filled 2015-03-07 (×4): qty 1500

## 2015-03-07 MED ORDER — SALINE SPRAY 0.65 % NA SOLN
1.0000 | NASAL | Status: DC | PRN
  Administered 2015-03-07 (×2): 1 via NASAL
  Filled 2015-03-07 (×2): qty 44

## 2015-03-07 NOTE — Progress Notes (Signed)
Inpatient Diabetes Program Recommendations  AACE/ADA: New Consensus Statement on Inpatient Glycemic Control (2015)  Target Ranges:  Prepandial:   less than 140 mg/dL      Peak postprandial:   less than 180 mg/dL (1-2 hours)      Critically ill patients:  140 - 180 mg/dL   Review of Glycemic Control  Results for DOANE, PLUDE (MRN [id number]) as of 03/07/2015 12:09  Ref. Range 03/06/2015 23:51 03/07/2015 01:44 03/07/2015 07:32 03/07/2015 11:37  Glucose-Capillary Latest Ref Range: 65-99 mg/dL 510 (H) 402 (H) 336 (H) 281 (H)    Diabetes history: Type 2 Outpatient Diabetes medications: Metformin 1000mg  bid Current orders for Inpatient glycemic control: Lantus 20 units q day, Novolog 0-15 units tid, Novolog 0-5 units qhs  Inpatient Diabetes Program Recommendations: Agree with current orders for diabetes management.    Consider ordering an A1C to determine blood sugar control over the past 3 months.  Consider changing diet to carb modified/heart healthy  Gentry Fitz, RN, [place], [place], CDE Diabetes Coordinator Inpatient Diabetes Program  [phone] (Team Pager) [phone] ([place]) 03/07/2015 12:12 PM

## 2015-03-07 NOTE — Progress Notes (Addendum)
[facility] [place] NAME: [name]    MR#:  [id number]  DATE OF BIRTH:  [birth year]  SUBJECTIVE:  Came in with increasing tenderness and swelling with redness over the nose  REVIEW OF SYSTEMS:   Review of Systems  Constitutional: Positive for fever. Negative for chills and weight loss.  HENT: Negative for ear discharge, ear pain and nosebleeds.   Eyes: Negative for blurred vision, pain and discharge.  Respiratory: Negative for sputum production, shortness of breath, wheezing and stridor.   Cardiovascular: Negative for chest pain, palpitations, orthopnea and PND.  Gastrointestinal: Negative for nausea, vomiting, abdominal pain and diarrhea.  Genitourinary: Negative for urgency and frequency.  Musculoskeletal: Negative for back pain and joint pain.  Skin: Positive for rash.  Neurological: Positive for weakness. Negative for sensory change, speech change and focal weakness.  Psychiatric/Behavioral: Negative for depression and hallucinations. The patient is not nervous/anxious.    Tolerating Diet:yes Tolerating PT: not rquired  DRUG ALLERGIES:   Allergies  Allergen Reactions  . Levaquin [Levofloxacin] Swelling  . Phenergan [Promethazine Hcl] Diarrhea    VITALS:  Blood pressure 157/97, pulse 105, temperature 98.3 F (36.8 C), temperature source Oral, resp. rate 20, height 6\' 3"  (1.905 m), weight 102.649 kg (226 lb 4.8 oz), SpO2 100 %.  PHYSICAL EXAMINATION:   Physical Exam  GENERAL:  45 y.o.-year-old patient lying in the bed with no acute distress.  EYES: Pupils equal, round, reactive to light and accommodation. No scleral icterus. Extraocular muscles intact.  HEENT: Head atraumatic, normocephalic. Oropharynx and nasopharynx clear. cellulits over the nose, poor dentition NECK:  Supple, no jugular venous distention. No thyroid enlargement, no tenderness.  LUNGS: Normal breath sounds bilaterally, no wheezing, rales,  rhonchi. No use of accessory muscles of respiration.  CARDIOVASCULAR: S1, S2 normal. No murmurs, rubs, or gallops.  ABDOMEN: Soft, nontender, nondistended. Bowel sounds present. No organomegaly or mass.  EXTREMITIES: No cyanosis, clubbing or edema b/l.    NEUROLOGIC: Cranial nerves II through XII are intact. No focal Motor or sensory deficits b/l.   PSYCHIATRIC: The patient is alert and oriented x 3.  SKIN: No obvious rash, lesion, or ulcer.   LABORATORY PANEL:  CBC  Recent Labs Lab 03/06/15 2202  WBC 17.4*  HGB 13.9  HCT 44.5  PLT 266    Chemistries   Recent Labs Lab 03/06/15 2202  NA 128*  K 3.8  CL 91*  CO2 26  GLUCOSE 599*  BUN 20  CREATININE 1.54*  CALCIUM 8.7*  AST 14*  ALT 15*  ALKPHOS 99  BILITOT 1.3*    Cardiac Enzymes No results for input(s): TROPONINI in the last 168 hours. RADIOLOGY:  Ct Maxillofacial W/cm  03/06/2015  CLINICAL DATA:  Facial swelling.  Possible cellulitis. EXAM: CT MAXILLOFACIAL WITH CONTRAST TECHNIQUE: Multidetector CT imaging of the maxillofacial structures was performed with intravenous contrast. Multiplanar CT image reconstructions were also generated. A small metallic BB was placed on the right temple in order to reliably differentiate right from left. CONTRAST:  84mL OMNIPAQUE IOHEXOL 300 MG/ML  SOLN COMPARISON:  CT scan of head of May 20, 2014. FINDINGS: Globes and orbits appear normal. Paranasal sinuses appear normal. Parapharyngeal spaces appear normal. No fracture or dislocation is noted. Poor dentition is noted. Mildly enlarged adenopathy is noted in the submandibular regions which most likely are reactive in origin. No abscess is noted. IMPRESSION: Poor dentition is noted. No fracture or dislocation is noted. No acute abnormality seen in  the maxillofacial region. Electronically Signed   By: Marijo Conception, M.D.   On: 03/06/2015 23:26   ASSESSMENT AND PLAN:   45 year old African-American male admitted for sepsis secondary to  cellulitis. 1. Sepsis:  - criteria based on tachycardia and leukocytosis. The patient also has a low-grade fever.  -Continue IV vancomycin and Zosyn.  2. Cellulitis: The patient's face is swollen and tender to his upper eyelids. Conjunctiva are also injected. He has a scabbed lesion at the tip of his nose which appears vesicular. -Maxillofacial CT negative -Appears superficial cellulitis. Patient also has poor dentition. No abscess felt. Continue vancomycin and Zosyn follow blood cultures white count. -Surgical versus ENT consultation if symptoms do not improve  3. Diabetes mellitus type 2: The patient's blood sugar is significantly elevated in the setting of infection. - placed him on sliding scale insulin. We will hold his oral hypoglycemics metformin secondary to IV contrast. -Start Lantus 20 units daily  4. Hypertension: Continue lisinopril with hydrochlorthiazide  5. Pseudohyponatremia: Secondary to hyperglycemia. Aggressively hydrate with intravenous fluid  6. DVT prophylaxis: Lovenox  Case discussed with Care Management/Social Worker. Management plans discussed with the patient, family and they are in agreement.  CODE STATUS: Full   TOTAL TIME TAKING CARE OF THIS PATIENT: *35 minutes.  >50% time spent on counselling and coordination of care  POSSIBLE D/C IN one to 2 DAYS, DEPENDING ON CLINICAL CONDITION.  Note: This dictation was prepared with Dragon dictation along with smaller phrase technology. Any transcriptional errors that result from this process are unintentional.  Carmeline Kowal M.D on 03/07/2015 at 1:33 PM  Between 7am to 6pm - Pager - [phone]  After 6pm go to [email or web address] - password [email or web address] [facility]  [facility]  Office  [phone]  CC: Primary care physician; Inc [facility]

## 2015-03-07 NOTE — H&P (Addendum)
[name]. is an 45 y.o. male.   Chief Complaint: Facial swelling and tenderness HPI: The patient presents emergency department complaining of facial swelling and pain. He states that his symptoms began 4 days ago with a tender "bubble" on his nose. The tenderness and associated general malaise worsened rapidly and he was seen by his primary care doctor and diagnosed with a sinus infection. The patient originally was prescribed Augmentin which he took for 3 days without improvement. He was then prescribed Levaquin which he has taken for 2 days. In the emergency department the patient was found to be tachycardic with a significant leukocytosis in addition to generalized facial swelling. CT scan of the head showed no sinusitis or abscess, though the patient does have poor dentition. Due to sepsis criteria emergency department staff called for admission.  Past Medical History  Diagnosis Date  . Diabetes mellitus without complication ([place])   . Hypertension     Past Surgical History  Procedure Laterality Date  . Arthroscopic repair acl    . Tonsilectomy/adenoidectomy with myringotomy    . Prostate surgery  2016    Family History  Problem Relation Age of Onset  . CAD Father   . Diabetes Mellitus II Mother    Social History:  reports that he has never smoked. He does not have any smokeless tobacco history on file. He reports that he does not drink alcohol or use illicit drugs.  Allergies:  Allergies  Allergen Reactions  . Levaquin [Levofloxacin] Swelling  . Phenergan [Promethazine Hcl] Diarrhea    Prior to Admission medications   Medication Sig Start Date End Date Taking? Authorizing Provider  lisinopril-hydrochlorothiazide (PRINZIDE,ZESTORETIC) 20-25 MG per tablet Take 1 tablet by mouth daily. 04/16/14  Yes Historical Provider, MD  metFORMIN (GLUCOPHAGE) 1000 MG tablet Take 1,000 mg by mouth 2 (two) times daily. 04/16/14  Yes Historical Provider, MD     Results for orders  placed or performed during the hospital encounter of 03/06/15 (from the past 48 hour(s))  CBC     Status: Abnormal   Collection Time: 03/06/15 10:02 PM  Result Value Ref Range   WBC 17.4 (H) 3.8 - 10.6 K/uL   RBC 6.36 (H) 4.40 - 5.90 MIL/uL   Hemoglobin 13.9 13.0 - 18.0 g/dL   HCT 44.5 40.0 - 52.0 %   MCV 70.1 (L) 80.0 - 100.0 fL   MCH 21.9 (L) 26.0 - 34.0 pg   MCHC 31.2 (L) 32.0 - 36.0 g/dL   RDW 13.4 11.5 - 14.5 %   Platelets 266 150 - 440 K/uL  Comprehensive metabolic panel     Status: Abnormal   Collection Time: 03/06/15 10:02 PM  Result Value Ref Range   Sodium 128 (L) 135 - 145 mmol/L   Potassium 3.8 3.5 - 5.1 mmol/L   Chloride 91 (L) 101 - 111 mmol/L   CO2 26 22 - 32 mmol/L   Glucose, Bld 599 (HH) 65 - 99 mg/dL    Comment: CRITICAL RESULT CALLED TO, READ BACK BY AND VERIFIED WITH JENNA ELLINGTON ON 03/06/15 AT 2234 [facility]    BUN 20 6 - 20 mg/dL   Creatinine, Ser 1.54 (H) 0.61 - 1.24 mg/dL   Calcium 8.7 (L) 8.9 - 10.3 mg/dL   Total Protein 8.2 (H) 6.5 - 8.1 g/dL   Albumin 3.1 (L) 3.5 - 5.0 g/dL   AST 14 (L) 15 - 41 U/L   ALT 15 (L) 17 - 63 U/L   Alkaline Phosphatase 99 38 -  126 U/L   Total Bilirubin 1.3 (H) 0.3 - 1.2 mg/dL   GFR calc non Af Amer 52 (L) >60 mL/min   GFR calc Af Amer >60 >60 mL/min    Comment: (NOTE) The eGFR has been calculated using the CKD EPI equation. This calculation has not been validated in all clinical situations. eGFR's persistently <60 mL/min signify possible Chronic Kidney Disease.    Anion gap 11 5 - 15  Glucose, capillary     Status: Abnormal   Collection Time: 03/06/15 11:51 PM  Result Value Ref Range   Glucose-Capillary 510 (H) 65 - 99 mg/dL   Ct Maxillofacial W/cm  03/06/2015  CLINICAL DATA:  Facial swelling.  Possible cellulitis. EXAM: CT MAXILLOFACIAL WITH CONTRAST TECHNIQUE: Multidetector CT imaging of the maxillofacial structures was performed with intravenous contrast. Multiplanar CT image reconstructions were also generated. A  small metallic BB was placed on the right temple in order to reliably differentiate right from left. CONTRAST:  46m OMNIPAQUE IOHEXOL 300 MG/ML  SOLN COMPARISON:  CT scan of head of May 20, 2014. FINDINGS: Globes and orbits appear normal. Paranasal sinuses appear normal. Parapharyngeal spaces appear normal. No fracture or dislocation is noted. Poor dentition is noted. Mildly enlarged adenopathy is noted in the submandibular regions which most likely are reactive in origin. No abscess is noted. IMPRESSION: Poor dentition is noted. No fracture or dislocation is noted. No acute abnormality seen in the maxillofacial region. Electronically Signed   By: JMarijo Conception M.D.   On: 03/06/2015 23:26    Review of Systems  Constitutional: Positive for fever and malaise/fatigue. Negative for chills.  HENT: Negative for sore throat and tinnitus.   Eyes: Negative for blurred vision and redness.  Respiratory: Negative for cough and shortness of breath.   Cardiovascular: Negative for chest pain, palpitations, orthopnea and PND.  Gastrointestinal: Negative for nausea, vomiting, abdominal pain and diarrhea.  Genitourinary: Negative for dysuria, urgency and frequency.  Musculoskeletal: Negative for myalgias and joint pain.  Skin: Positive for rash.       No lesions  Neurological: Negative for speech change, focal weakness and weakness.  Endo/Heme/Allergies: Does not bruise/bleed easily.       No temperature intolerance  Psychiatric/Behavioral: Negative for depression and suicidal ideas.    Blood pressure 156/94, pulse 110, temperature 99.9 F (37.7 C), temperature source Oral, resp. rate 18, height 6' 3"  (1.905 m), weight 101.152 kg (223 lb), SpO2 96 %. Physical Exam  Constitutional: He is oriented to person, place, and time. He appears well-developed and well-nourished. No distress.  HENT:  Head: Normocephalic and atraumatic.  Nose:    Mouth/Throat: Oropharynx is clear and moist. No oropharyngeal  exudate.  Bulb of the nose erythematous and tender; mucosal surface of right nare swollen  Eyes: EOM are normal. Pupils are equal, round, and reactive to light. Right conjunctiva is injected. Left conjunctiva is injected. No scleral icterus.  Upper eyelids swollen bilaterally  Neck: Normal range of motion. Neck supple. No JVD present. No tracheal deviation present. No thyromegaly present.  Cardiovascular: Normal rate and normal heart sounds.  Exam reveals no gallop and no friction rub.   No murmur heard. Respiratory: Effort normal and breath sounds normal. No respiratory distress.  GI: Soft. Bowel sounds are normal. He exhibits no distension. There is no tenderness.  Genitourinary:  Deferred  Lymphadenopathy:    He has no cervical adenopathy.  Neurological: He is alert and oriented to person, place, and time. No cranial nerve deficit.  Skin:  Skin is warm. Rash noted. He is diaphoretic. There is erythema.     Assessment/Plan This is a 45 year old African-American male admitted for sepsis secondary to cellulitis. 1. Sepsis: The patient meets criteria based on tachycardia and leukocytosis. The patient also has a low-grade fever. We will obtain blood cultures and start the patient on broad-spectrum antibiotics. 2. Cellulitis: The patient's face is swollen and tender to his upper eyelids. Conjunctiva are also injected. He has a scabbed lesion at the tip of his nose which appears vesicular. Initially it was suspected that he may have had an allergic reaction to some of his oral antibiotics. However, due to his general malaise and the appearance of the aforementioned lesion I am concerned that he may have superinfected herpetic lesions. I will start the patient on acyclovir. Benadryl for swelling. Dermatology consult at discretion of the primary team. 3. Diabetes mellitus type 2: The patient's blood sugar is significantly elevated in the setting of infection. I placed him on sliding scale insulin. We  will hold his oral hypoglycemics 4. Hypertension: Continue lisinopril with hydrochlorthiazide 5. Pseudohyponatremia: Secondary to hyperglycemia. Aggressively hydrate with intravenous fluid 6. DVT prophylaxis: Heparin 7. GI prophylaxis: None The patient is a full code. Time spent on admission orders and patient care approximately 45 minutes  [name] 03/07/2015, 1:03 AM

## 2015-03-07 NOTE — Progress Notes (Signed)
ANTIBIOTIC CONSULT NOTE - INITIAL  Pharmacy Consult for vancomycin and Zosyn dosing Indication: sepsis/cellulitis  Allergies  Allergen Reactions  . Levaquin [Levofloxacin] Swelling  . Phenergan [Promethazine Hcl] Diarrhea    Patient Measurements: Height: 6\' 3"  (190.5 cm) Weight: 226 lb 4.8 oz (102.649 kg) IBW/kg (Calculated) : 84.5 Adjusted Body Weight: 102.6kg  Vital Signs: Temp: 99.8 F (37.7 C) (01/13 0144) Temp Source: Oral (01/13 0144) BP: 147/92 mmHg (01/13 0144) Pulse Rate: 100 (01/13 0144) Intake/Output from previous day: 01/12 0701 - 01/13 0700 In: -  Out: 660 [Urine:660] Intake/Output from this shift: Total I/O In: -  Out: 660 [Urine:660]  Labs:  Recent Labs  03/06/15 2202  WBC 17.4*  HGB 13.9  PLT 266  CREATININE 1.54*   Estimated Creatinine Clearance: 76.9 mL/min (by C-G formula based on Cr of 1.54). No results for input(s): VANCOTROUGH, VANCOPEAK, VANCORANDOM, GENTTROUGH, GENTPEAK, GENTRANDOM, TOBRATROUGH, TOBRAPEAK, TOBRARND, AMIKACINPEAK, AMIKACINTROU, AMIKACIN in the last 72 hours.   Microbiology: No results found for this or any previous visit (from the past 720 hour(s)).  Medical History: Past Medical History  Diagnosis Date  . Diabetes mellitus without complication ([place])   . Hypertension     Medications:   Assessment: Blood cx pending  Goal of Therapy:  Vancomycin trough level 15-20 mcg/ml  Plan:  TBW 102.6kg  IBW 84.5kg  DW 102.6kg  Vd 72L kei 0.068 hr-1  t1/2 10 hours 1500 mg IV q 12 hours ordered. Pt not candidate for stacked dosing. Level before 5th dose.  Zosyn 4.5 grams ordered for Pseudomonas risk of recent abx exposure.  Bernie Fobes S 03/07/2015,3:13 AM

## 2015-03-08 LAB — BASIC METABOLIC PANEL
Anion gap: 10 (ref 5–15)
BUN: 15 mg/dL (ref 6–20)
CHLORIDE: 103 mmol/L (ref 101–111)
CO2: 24 mmol/L (ref 22–32)
CREATININE: 0.96 mg/dL (ref 0.61–1.24)
Calcium: 8.6 mg/dL — ABNORMAL LOW (ref 8.9–10.3)
GFR calc Af Amer: >60 mL/min (ref >60)
GFR calc non Af Amer: >60 mL/min (ref >60)
Glucose, Bld: 326 mg/dL — ABNORMAL HIGH (ref 65–99)
POTASSIUM: 3.7 mmol/L (ref 3.5–5.1)
Sodium: 137 mmol/L (ref 135–145)

## 2015-03-08 LAB — GLUCOSE, CAPILLARY
Glucose-Capillary: 309 mg/dL — ABNORMAL HIGH (ref 65–99)
Glucose-Capillary: 255 mg/dL — ABNORMAL HIGH (ref 65–99)
Glucose-Capillary: 228 mg/dL — ABNORMAL HIGH (ref 65–99)
Glucose-Capillary: 259 mg/dL — ABNORMAL HIGH (ref 65–99)

## 2015-03-08 MED ORDER — VANCOMYCIN HCL 10 G IV SOLR
1500.0000 mg | Freq: Three times a day (TID) | INTRAVENOUS | Status: DC
  Administered 2015-03-08 – 2015-03-10 (×6): 1500 mg via INTRAVENOUS
  Filled 2015-03-08 (×9): qty 1500

## 2015-03-08 MED ORDER — METFORMIN HCL 500 MG PO TABS
1000.0000 mg | ORAL_TABLET | Freq: Two times a day (BID) | ORAL | Status: DC
  Administered 2015-03-08 – 2015-03-10 (×4): 1000 mg via ORAL
  Filled 2015-03-08 (×4): qty 2

## 2015-03-08 MED ORDER — SODIUM CHLORIDE 0.9 % IV SOLN
INTRAVENOUS | Status: DC
  Administered 2015-03-08 – 2015-03-10 (×3): via INTRAVENOUS

## 2015-03-08 NOTE — Progress Notes (Signed)
ANTIBIOTIC CONSULT NOTE - Follow up  Pharmacy Consult for vancomycin and Zosyn dosing Indication: sepsis/cellulitis  Allergies  Allergen Reactions  . Levaquin [Levofloxacin] Swelling  . Phenergan [Promethazine Hcl] Diarrhea    Patient Measurements: Height: 6\' 3"  (190.5 cm) Weight: 225 lb 14.4 oz (102.468 kg) IBW/kg (Calculated) : 84.5 Adjusted Body Weight: 91.7   (21% over ideal BW so use TBW of 102 kg for dosing)  Vital Signs: Temp: 99.1 F (37.3 C) (01/14 0837) Temp Source: Oral (01/14 0837) BP: 162/86 mmHg (01/14 0837) Pulse Rate: 97 (01/14 0837) Intake/Output from previous day: 01/13 0701 - 01/14 0700 In: 2137.5 [P.O.:720; I.V.:1417.5] Out: -  Intake/Output from this shift: Total I/O In: 635 [P.O.:240; I.V.:295; IV Piggyback:100] Out: -   Labs:  Recent Labs  03/06/15 2202 03/08/15 0931  WBC 17.4*  --   HGB 13.9  --   PLT 266  --   CREATININE 1.54* 0.96   Estimated Creatinine Clearance: 123.4 mL/min (by C-G formula based on Cr of 0.96).   Microbiology: Recent Results (from the past 720 hour(s))  Blood culture (routine x 2)     Status: None (Preliminary result)   Collection Time: 03/06/15 10:20 PM  Result Value Ref Range Status   Specimen Description BLOOD LEFT ANTECUBITAL  Final   Special Requests BOTTLES DRAWN AEROBIC AND ANAEROBIC 10ML  Final   Culture NO GROWTH < 12 HOURS  Final   Report Status PENDING  Incomplete  Blood culture (routine x 2)     Status: None (Preliminary result)   Collection Time: 03/06/15 10:26 PM  Result Value Ref Range Status   Specimen Description BLOOD RIGHT ANTECUBITAL  Final   Special Requests BOTTLES DRAWN AEROBIC AND ANAEROBIC 13CC  Final   Culture NO GROWTH < 12 HOURS  Final   Report Status PENDING  Incomplete    Medical History: Past Medical History  Diagnosis Date  . Diabetes mellitus without complication ([place])   . Hypertension     Medications:   Assessment: Blood cx pending  Goal of Therapy:  Vancomycin  trough level 15-20 mcg/ml  Plan:  TBW 102.6kg  IBW 84.5kg  DW 102.6kg  Vd 72L kei 0.068 hr-1  t1/2 10 hours 1500 mg IV q 12 hours ordered. Pt not candidate for stacked dosing. Level before 5th dose.  Zosyn 4.5 grams ordered for Pseudomonas risk of recent abx exposure.  1/14:  Scr improved from 1.54 to 0.96. New PK: Ke= 0.099  T1/2= 7  Vd= 71.2. Will transition to Vancomycin 1500mg  IV q8h. Noted per MD if symptoms do not improve-surgical vs ENT consult.  Vancomycin trough 1/15 at 1330.   [name] A 03/08/2015,11:46 AM

## 2015-03-08 NOTE — Progress Notes (Signed)
[name] at [place] NAME: [name]    MR#:  [id number]  DATE OF BIRTH:  [birth year]  SUBJECTIVE:  Came in with increasing tenderness and swelling with redness over the nose No high grade fever. Pain 7/10  REVIEW OF SYSTEMS:   Review of Systems  Constitutional: Positive for fever. Negative for chills and weight loss.  HENT: Negative for ear discharge, ear pain and nosebleeds.   Eyes: Negative for blurred vision, pain and discharge.  Respiratory: Negative for sputum production, shortness of breath, wheezing and stridor.   Cardiovascular: Negative for chest pain, palpitations, orthopnea and PND.  Gastrointestinal: Negative for nausea, vomiting, abdominal pain and diarrhea.  Genitourinary: Negative for urgency and frequency.  Musculoskeletal: Negative for back pain and joint pain.  Skin: Positive for rash.  Neurological: Positive for weakness. Negative for sensory change, speech change and focal weakness.  Psychiatric/Behavioral: Negative for depression and hallucinations. The patient is not nervous/anxious.    Tolerating Diet:yes Tolerating PT: not required  DRUG ALLERGIES:   Allergies  Allergen Reactions  . Levaquin [Levofloxacin] Swelling  . Phenergan [Promethazine Hcl] Diarrhea    VITALS:  Blood pressure 162/86, pulse 97, temperature 99.1 F (37.3 C), temperature source Oral, resp. rate 18, height 6\' 3"  (1.905 m), weight 102.468 kg (225 lb 14.4 oz), SpO2 97 %.  PHYSICAL EXAMINATION:   Physical Exam  GENERAL:  45 y.o.-year-old patient lying in the bed with no acute distress.  EYES: Pupils equal, round, reactive to light and accommodation. No scleral icterus. Extraocular muscles intact.  HEENT: Head atraumatic, normocephalic. Oropharynx and nasopharynx clear. cellulits over the nose, poor dentition, facial edema NECK:  Supple, no jugular venous distention. No thyroid enlargement, no tenderness.  LUNGS: Normal  breath sounds bilaterally, no wheezing, rales, rhonchi. No use of accessory muscles of respiration.  CARDIOVASCULAR: S1, S2 normal. No murmurs, rubs, or gallops.  ABDOMEN: Soft, nontender, nondistended. Bowel sounds present. No organomegaly or mass.  EXTREMITIES: No cyanosis, clubbing or edema b/l.    NEUROLOGIC: Cranial nerves II through XII are intact. No focal Motor or sensory deficits b/l.   PSYCHIATRIC: The patient is alert and oriented x 3.  SKIN: No obvious rash, lesion, or ulcer.   LABORATORY PANEL:  CBC  Recent Labs Lab 03/06/15 2202  WBC 17.4*  HGB 13.9  HCT 44.5  PLT 266    Chemistries   Recent Labs Lab 03/06/15 2202 03/08/15 0931  NA 128* 137  K 3.8 3.7  CL 91* 103  CO2 26 24  GLUCOSE 599* 326*  BUN 20 15  CREATININE 1.54* 0.96  CALCIUM 8.7* 8.6*  AST 14*  --   ALT 15*  --   ALKPHOS 99  --   BILITOT 1.3*  --     Cardiac Enzymes No results for input(s): TROPONINI in the last 168 hours. RADIOLOGY:  Ct Maxillofacial W/cm  03/06/2015  CLINICAL DATA:  Facial swelling.  Possible cellulitis. EXAM: CT MAXILLOFACIAL WITH CONTRAST TECHNIQUE: Multidetector CT imaging of the maxillofacial structures was performed with intravenous contrast. Multiplanar CT image reconstructions were also generated. A small metallic BB was placed on the right temple in order to reliably differentiate right from left. CONTRAST:  73mL OMNIPAQUE IOHEXOL 300 MG/ML  SOLN COMPARISON:  CT scan of head of May 20, 2014. FINDINGS: Globes and orbits appear normal. Paranasal sinuses appear normal. Parapharyngeal spaces appear normal. No fracture or dislocation is noted. Poor dentition is noted. Mildly enlarged adenopathy is noted in  the submandibular regions which most likely are reactive in origin. No abscess is noted. IMPRESSION: Poor dentition is noted. No fracture or dislocation is noted. No acute abnormality seen in the maxillofacial region. Electronically Signed   By: Marijo Conception, M.D.   On:  03/06/2015 23:26   ASSESSMENT AND PLAN:   45 year old African-American male admitted for sepsis secondary to cellulitis. 1. Sepsis:  - criteria based on tachycardia and leukocytosis.  patient also has a low-grade fever.  -Continue IV vancomycin and Zosyn.  2. Cellulitis: - patient's face is swollen and tender to his upper eyelids.  - He has a scabbed lesion at the tip of his nose which appears vesicular. -Maxillofacial CT negative -Patient also has poor dentition. No abscess felt. Continue vancomycin and Zosyn follow blood cultures white count. -Surgical versus ENT consultation if symptoms do not improve  3. Diabetes mellitus type 2: The patient's blood sugar is significantly elevated in the setting of infection. - placed him on sliding scale insulin.  -Start Lantus 20 units daily -creat normal resume metformin  4. Hypertension: Continue lisinopril with hydrochlorthiazide  5. Pseudohyponatremia: Secondary to hyperglycemia. Aggressively hydrate with intravenous fluid  6. DVT prophylaxis: Lovenox  Case discussed with Care Management/Social Worker. Management plans discussed with the patient, family and they are in agreement.  CODE STATUS: Full   TOTAL TIME TAKING CARE OF THIS PATIENT: *35 minutes.  >50% time spent on counselling and coordination of care  POSSIBLE D/C IN one to 2 DAYS, DEPENDING ON CLINICAL CONDITION.  Note: This dictation was prepared with Dragon dictation along with smaller phrase technology. Any transcriptional errors that result from this process are unintentional.  Hiyab Nhem M.D on 03/08/2015 at 11:32 AM  Between 7am to 6pm - Pager - [phone]  After 6pm go to [email or web address] - password [email or web address] [facility]  [facility]  Office  [phone]  CC: Primary care physician; Inc [facility]

## 2015-03-08 NOTE — Plan of Care (Signed)
Problem: Education: Goal: Knowledge of [facility] Education information/materials will improve Outcome: Progressing Patient with wife at bedside this shift.  Education provided on antibiotics prescribed and treatment plan for shift.    Problem: Safety: Goal: Ability to remain free from injury will improve Outcome: Progressing Patient is low fall risk.  He is up independently in room and bathroom.  Patient is steady on feet.

## 2015-03-09 LAB — CBC
HCT: 38.2 % — ABNORMAL LOW (ref 40.0–52.0)
Hemoglobin: 12 g/dL — ABNORMAL LOW (ref 13.0–18.0)
MCH: 22.6 pg — ABNORMAL LOW (ref 26.0–34.0)
MCHC: 31.5 g/dL — ABNORMAL LOW (ref 32.0–36.0)
MCV: 71.7 fL — ABNORMAL LOW (ref 80.0–100.0)
PLATELETS: 255 10*3/uL (ref 150–440)
RBC: 5.32 MIL/uL (ref 4.40–5.90)
RDW: 13.2 % (ref 11.5–14.5)
WBC: 11.3 10*3/uL — AB (ref 3.8–10.6)

## 2015-03-09 LAB — CREATININE, SERUM
CREATININE: 1.09 mg/dL (ref 0.61–1.24)
GFR calc non Af Amer: >60 mL/min (ref >60)

## 2015-03-09 LAB — GLUCOSE, CAPILLARY
Glucose-Capillary: 217 mg/dL — ABNORMAL HIGH (ref 65–99)
Glucose-Capillary: 261 mg/dL — ABNORMAL HIGH (ref 65–99)
Glucose-Capillary: 172 mg/dL — ABNORMAL HIGH (ref 65–99)

## 2015-03-09 LAB — VANCOMYCIN, TROUGH: VANCOMYCIN TR: 13 ug/mL (ref 10–20)

## 2015-03-09 MED ORDER — IBUPROFEN 600 MG PO TABS
600.0000 mg | ORAL_TABLET | Freq: Three times a day (TID) | ORAL | Status: DC
  Administered 2015-03-09 – 2015-03-10 (×3): 600 mg via ORAL
  Filled 2015-03-09 (×3): qty 1

## 2015-03-09 NOTE — Plan of Care (Signed)
Problem: Education: Goal: Knowledge of [facility] Education information/materials will improve Outcome: Progressing Patient with wife at bedside this shift. Education provided on antibiotics prescribed and treatment plan for shift. Swelling on face has reduced from previous evening.  Pain is better controlled as well.   Problem: Safety: Goal: Ability to remain free from injury will improve Outcome: Progressing Patient is low fall risk. He is up independently in room and bathroom. Patient is steady on feet.

## 2015-03-09 NOTE — Progress Notes (Signed)
[place] at [place] NAME: [name]    MR#:  [id number]  DATE OF BIRTH:  30-Sep-1970  SUBJECTIVE:  Came in with increasing tenderness and swelling with redness over the nose No high grade fever. Pain and facial swelling reduced remarkably  REVIEW OF SYSTEMS:   Review of Systems  Constitutional: Negative for fever, chills and weight loss.  HENT: Negative for ear discharge, ear pain and nosebleeds.   Eyes: Negative for blurred vision, pain and discharge.  Respiratory: Negative for sputum production, shortness of breath, wheezing and stridor.   Cardiovascular: Negative for chest pain, palpitations, orthopnea and PND.  Gastrointestinal: Negative for nausea, vomiting, abdominal pain and diarrhea.  Genitourinary: Negative for urgency and frequency.  Musculoskeletal: Negative for back pain and joint pain.  Skin: Positive for rash.  Neurological: Negative for sensory change, speech change, focal weakness and weakness.  Psychiatric/Behavioral: Negative for depression and hallucinations. The patient is not nervous/anxious.    Tolerating Diet:yes Tolerating PT: not required  DRUG ALLERGIES:   Allergies  Allergen Reactions  . Levaquin [Levofloxacin] Swelling  . Phenergan [Promethazine Hcl] Diarrhea    VITALS:  Blood pressure 170/95, pulse 98, temperature 99.4 F (37.4 C), temperature source Oral, resp. rate 18, height 6\' 3"  (1.905 m), weight 103.284 kg (227 lb 11.2 oz), SpO2 98 %.  PHYSICAL EXAMINATION:   Physical Exam  GENERAL:  45 y.o.-year-old patient lying in the bed with no acute distress.  EYES: Pupils equal, round, reactive to light and accommodation. No scleral icterus. Extraocular muscles intact.  HEENT: Head atraumatic, normocephalic. Oropharynx and nasopharynx clear. cellulits over the nose, poor dentition, facial edema NECK:  Supple, no jugular venous distention. No thyroid enlargement, no tenderness.  LUNGS:  Normal breath sounds bilaterally, no wheezing, rales, rhonchi. No use of accessory muscles of respiration.  CARDIOVASCULAR: S1, S2 normal. No murmurs, rubs, or gallops.  ABDOMEN: Soft, nontender, nondistended. Bowel sounds present. No organomegaly or mass.  EXTREMITIES: No cyanosis, clubbing or edema b/l.    NEUROLOGIC: Cranial nerves II through XII are intact. No focal Motor or sensory deficits b/l.   PSYCHIATRIC: The patient is alert and oriented x 3.  SKIN: No obvious rash, lesion, or ulcer.   LABORATORY PANEL:  CBC  Recent Labs Lab 03/09/15 0446  WBC 11.3*  HGB 12.0*  HCT 38.2*  PLT 255    Chemistries   Recent Labs Lab 03/06/15 2202 03/08/15 0931 03/09/15 0446  NA 128* 137  --   K 3.8 3.7  --   CL 91* 103  --   CO2 26 24  --   GLUCOSE 599* 326*  --   BUN 20 15  --   CREATININE 1.54* 0.96 1.09  CALCIUM 8.7* 8.6*  --   AST 14*  --   --   ALT 15*  --   --   ALKPHOS 99  --   --   BILITOT 1.3*  --   --     Cardiac Enzymes No results for input(s): TROPONINI in the last 168 hours. RADIOLOGY:  No results found. ASSESSMENT AND PLAN:   45 year old African-American male admitted for sepsis secondary to cellulitis. 1. Sepsis:  - criteria based on tachycardia and leukocytosis.  patient also has a low-grade fever.  -Continue IV vancomycin and Zosyn.  2. Cellulitis: - patient's face is swollen and tender to his upper eyelids.  - He has a scabbed lesion at the tip of his nose which appears vesicular -  dried up -Maxillofacial CT negative -Patient also has poor dentition. No abscess felt. Continue vancomycin and Zosyn. D/c zosyn today -BC neg -WBC 17K--11K Ibuprofen tid for inflammation with meals  3. Diabetes mellitus type 2: The patient's blood sugar is significantly elevated in the setting of infection. - placed him on sliding scale insulin.  -Start Lantus 20 units daily -creat normal, resumed metformin  4. Hypertension: Continue lisinopril with  hydrochlorthiazide  5. Pseudohyponatremia: Secondary to hyperglycemia. Aggressively hydrate with intravenous fluid  6. DVT prophylaxis: Lovenox  Overall improving. D/c in am if remains stable Case discussed with Care Management/Social Worker. Management plans discussed with the patient, family and they are in agreement.  CODE STATUS: Full   TOTAL TIME TAKING CARE OF THIS PATIENT: *35 minutes.  >50% time spent on counselling and coordination of care  POSSIBLE D/C IN one to 2 DAYS, DEPENDING ON CLINICAL CONDITION.  Note: This dictation was prepared with Dragon dictation along with smaller phrase technology. Any transcriptional errors that result from this process are unintentional.  Brenin Heidelberger M.D on 03/09/2015 at 1:23 PM  Between 7am to 6pm - Pager - [phone]  After 6pm go to [email or web address] - password [email or web address] [facility]  [facility]  Office  [phone]  CC: Primary care physician; [facility]

## 2015-03-09 NOTE — Progress Notes (Addendum)
ANTIBIOTIC CONSULT NOTE - Follow up  Pharmacy Consult for vancomycin Indication: sepsis/cellulitis  Allergies  Allergen Reactions  . Levaquin [Levofloxacin] Swelling  . Phenergan [Promethazine Hcl] Diarrhea    Patient Measurements: Height: 6\' 3"  (190.5 cm) Weight: 227 lb 11.2 oz (103.284 kg) IBW/kg (Calculated) : 84.5 Adjusted Body Weight: 91.7   (only 21% over ideal BW so use TBW of 102 kg for dosing)  Vital Signs: Temp: 99.4 F (37.4 C) (01/15 0452) Temp Source: Oral (01/15 0452) BP: 170/95 mmHg (01/15 0452) Pulse Rate: 98 (01/15 0452) Intake/Output from previous day: 01/14 0701 - 01/15 0700 In: 2380 [P.O.:720; I.V.:960; IV Piggyback:700] Out: -  Intake/Output from this shift: Total I/O In: 240 [P.O.:240] Out: -   Labs:  Recent Labs  03/06/15 2202 03/08/15 0931 03/09/15 0446  WBC 17.4*  --  11.3*  HGB 13.9  --  12.0*  PLT 266  --  255  CREATININE 1.54* 0.96 1.09   Estimated Creatinine Clearance: 109 mL/min (by C-G formula based on Cr of 1.09).   Microbiology: Recent Results (from the past 720 hour(s))  Blood culture (routine x 2)     Status: None (Preliminary result)   Collection Time: 03/06/15 10:20 PM  Result Value Ref Range Status   Specimen Description BLOOD LEFT ANTECUBITAL  Final   Special Requests BOTTLES DRAWN AEROBIC AND ANAEROBIC 10ML  Final   Culture NO GROWTH 2 DAYS  Final   Report Status PENDING  Incomplete  Blood culture (routine x 2)     Status: None (Preliminary result)   Collection Time: 03/06/15 10:26 PM  Result Value Ref Range Status   Specimen Description BLOOD RIGHT ANTECUBITAL  Final   Special Requests BOTTLES DRAWN AEROBIC AND ANAEROBIC 13CC  Final   Culture NO GROWTH 2 DAYS  Final   Report Status PENDING  Incomplete    Medical History: Past Medical History  Diagnosis Date  . Diabetes mellitus without complication ([place])   . Hypertension     Medications:   Assessment: Blood cx pending  Goal of Therapy:  Vancomycin  trough level 15-20 mcg/ml  Plan:  TBW 102.6kg  IBW 84.5kg  DW 102.6kg  Vd 72L kei 0.068 hr-1  t1/2 10 hours 1500 mg IV q 12 hours ordered. Pt not candidate for stacked dosing. Level before 5th dose.  Zosyn 4.5 grams ordered for Pseudomonas risk of recent abx exposure.  1/14:  Scr improved from 1.54 to 0.96. New PK: Ke= 0.099  T1/2= 7  Vd= 71.2. Will transition to Vancomycin 1500mg  IV q8h. Noted per MD if symptoms do not improve-surgical vs ENT consult.  Vancomycin trough 1/15 at 1330.   1/15 Vancomycin trough at 1258= 13 mcg/ml. Per MD note pain and swelling reduced remarkably. Poss d/c in am if remains stable.  Will continue current regimen of Vancomycin 1500mg  IV q8h as accumulation may occur. F/u for next level.   Zosyn d/c  Chastelyn Athens A 03/09/2015,2:23 PM

## 2015-03-10 LAB — GLUCOSE, CAPILLARY
Glucose-Capillary: 243 mg/dL — ABNORMAL HIGH (ref 65–99)
Glucose-Capillary: 215 mg/dL — ABNORMAL HIGH (ref 65–99)
Glucose-Capillary: 183 mg/dL — ABNORMAL HIGH (ref 65–99)

## 2015-03-10 MED ORDER — LIVING WELL WITH DIABETES BOOK
Freq: Once | Status: AC
  Administered 2015-03-10: 18:00:00
  Filled 2015-03-10 (×2): qty 1

## 2015-03-10 MED ORDER — SULFAMETHOXAZOLE-TRIMETHOPRIM 800-160 MG PO TABS: 1.0000 | ORAL_TABLET | Freq: Two times a day (BID) | ORAL | Status: DC

## 2015-03-10 MED ORDER — INSULIN GLARGINE 100 UNIT/ML ~~LOC~~ SOLN: 24.0000 [IU] | Freq: Every day | SUBCUTANEOUS | Status: DC

## 2015-03-10 MED ORDER — SALINE SPRAY 0.65 % NA SOLN: 1.0000 | NASAL | Status: DC | PRN

## 2015-03-10 MED ORDER — SULFAMETHOXAZOLE-TRIMETHOPRIM 800-160 MG PO TABS
1.0000 | ORAL_TABLET | Freq: Two times a day (BID) | ORAL | Status: DC
  Administered 2015-03-10: 13:00:00 1 via ORAL
  Filled 2015-03-10 (×2): qty 1

## 2015-03-10 MED ORDER — INSULIN ASPART 100 UNIT/ML ~~LOC~~ SOLN: 4.0000 [IU] | Freq: Three times a day (TID) | SUBCUTANEOUS | Status: DC

## 2015-03-10 MED ORDER — INSULIN STARTER KIT- PEN NEEDLES (ENGLISH)
1.0000 | Freq: Once | Status: AC
  Administered 2015-03-10: 16:00:00 1
  Filled 2015-03-10: qty 1

## 2015-03-10 MED ORDER — IBUPROFEN 600 MG PO TABS: 600.0000 mg | ORAL_TABLET | Freq: Three times a day (TID) | ORAL | Status: DC

## 2015-03-10 NOTE — Discharge Instructions (Signed)
ENT out pt appt as needed Cellulitis Cellulitis is an infection of the skin and the tissue under the skin. The infected area is usually red and tender. This happens most often in the arms and lower legs. HOME CARE   Take your antibiotic medicine as told. Finish the medicine even if you start to feel better.  Keep the infected arm or leg raised (elevated).  Put a warm cloth on the area up to 4 times per day.  Only take medicines as told by your doctor.  Keep all doctor visits as told. GET HELP IF:  You see red streaks on the skin coming from the infected area.  Your red area gets bigger or turns a dark color.  Your bone or joint under the infected area is painful after the skin heals.  Your infection comes back in the same area or different area.  You have a puffy (swollen) bump in the infected area.  You have new symptoms.  You have a fever. GET HELP RIGHT AWAY IF:   You feel very sleepy.  You throw up (vomit) or have watery poop (diarrhea).  You feel sick and have muscle aches and pains.   This information is not intended to replace advice given to you by your health care provider. Make sure you discuss any questions you have with your health care provider.   Document Released: 07/28/2007 Document Revised: 10/30/2014 Document Reviewed: 04/26/2011 Elsevier Interactive Patient Education [employer].

## 2015-03-10 NOTE — Progress Notes (Addendum)
Inpatient Diabetes Program Recommendations  AACE/ADA: New Consensus Statement on Inpatient Glycemic Control (2015)  Target Ranges:  Prepandial:   less than 140 mg/dL      Peak postprandial:   less than 180 mg/dL (1-2 hours)      Critically ill patients:  140 - 180 mg/dL  Results for [name], [name] (MRN [id number]) as of 03/10/2015 08:47  Ref. Range 03/09/2015 07:43 03/09/2015 11:01 03/09/2015 22:00 03/10/2015 07:16  Glucose-Capillary Latest Ref Range: 65-99 mg/dL 217 (H) 261 (H) 172 (H) 243 (H)  Results for [name], [name] (MRN [id number]) as of 03/10/2015 08:47  Ref. Range 03/06/2015 22:02  Hemoglobin A1C Latest Ref Range: 4.0-6.0 % 15.6 (H)   Review of Glycemic Control  Diabetes history: DM2 Outpatient Diabetes medications: Metformin 1000 mg BID Current orders for Inpatient glycemic control: Lantus 20 units daily, Novolog 0-15 units TID with meals, Novolog 0-5 units HS, Metformin 1000 mg BID  Inpatient Diabetes Program Recommendations: Insulin - Basal: Please consider increasing Lantus to 24 units daily (based on 96 kg x 0.25 units). If Lantus is increased as recommended, please order a one time order for Lantus 4 units to be given now (since patient has already received Lantus 20 units today). Insulin - Meal Coverage: Please consider ordering Novolog 4 units TID with meals for meal coverage (in addition to Novolog correction). HgbA1C: A1C 15.6% on 03/06/15 indicating an average glucose of 408 mg/dl over the past 2-3 months. MD, please note discharge plan for DM control (will pt be discharged on insulin; if so pt will need to be educated). Outpatient Referral: Recommend outpatient DM education and follow up.   03/10/15@12 :40- Spoke with patient about diabetes and home regimen for diabetes control. Patient reports that he is followed by his PCP for diabetes management and currently he takes Metformin 1000 mg BID as an outpatient for diabetes control. Patient reports that he use to take  Lantus and Novolog insulin in the past and last used insulin about 6 months ago. Patient states that he lost weight and his doctor told him he could stop using the insulin and see how his blood glucose would do.  Inquired about knowledge about A1C and patient reports that he what an A1C is. Discussed A1C results (15.6% on 03/06/15), reviewed what an A1C is and informed patient that his current A1C indicates an average glucose of 408 mg/dl over the past 2-3 months. Discussed importance of checking CBGs and maintaining good CBG control to prevent long-term and short-term complications. Discussed impact of nutrition, exercise, stress, sickness, and medications on diabetes control.  Patient states that he will need a prescription for a glucometer and testing supplies at time of discharge. Reviewed A1C and glucose goals. Asked patient to check his glucose 4 times per day and to keep a log of glucose results along with insulin taken. Explained how his PCP can use the glucose log to identify trends and make adjustments with DM medications. Asked patient to take his glucose log with him to each visit with PCP.  Discussed hypoglycemia and hyperglycemia along with treatment for both. Discussed discharge plan with Lantus and Novolog. Patient again stated he had taken both Lantus and Novolog with vial and syringe and he knew how to draw up and self-administer insulin. Inquired about insulin pens and patient states that he has seen an insulin pen and prefers to use the vial and syringe for insulin injections. Encouraged patient to look online for Lantus and Novolog savings card to reduce his co-pay  out of pocket cost.  Patient verbalized understanding of information discussed and he states that he has no further questions at this time related to diabetes.   At time of discharge: Please provide patient with a Rx for glucometer, test strips, lancets, Lantus (vial), Novolog (vial), and insulin syringes.  Thanks, Barnie Alderman, RN,  MSN, CDE Diabetes Coordinator Inpatient Diabetes Program [phone] (Team Pager from [place] to [place]) [phone] ([facility] office) [phone] [facility] office) [phone] [facility] office)

## 2015-03-10 NOTE — Progress Notes (Signed)
Discharged home at this time. Alert. No resp distress. Discharge instructions discussed with pt and wife.  presc called in to pts pharmacy earlier. presc for  Meter and lancets given to pt and discussed.  meds discussed with pt and wife. Diet/  And f/uand activity discussed/ pt verbalizes understanding of all.

## 2015-03-10 NOTE — Plan of Care (Signed)
Problem: Safety: Goal: Ability to remain free from injury will improve Outcome: Progressing Pt remains free from injury.  Pt up ad lib.  Family at the bedside.

## 2015-03-10 NOTE — Progress Notes (Signed)
[place] was admitted to the Hospital on 03/06/2015 and Discharged  03/10/2015 and should be excused from work/school   for 3 days starting 03/10/2015 may return to work/school without any restrictions on 03/13/2015  Call Fritzi Mandes MD, [facility]  [phone] with questions.  [place] Cawood M.D on 03/10/2015,at 10:35 AM

## 2015-03-10 NOTE — Discharge Summary (Addendum)
[name] at [place] NAME: [name]    MR#:  [id number]  DATE OF BIRTH:  Mar 13, 1970  DATE OF ADMISSION:  03/06/2015 ADMITTING PHYSICIAN: Harrie Foreman, MD  DATE OF DISCHARGE: 03/10/15  PRIMARY CARE PHYSICIAN: [facility]    ADMISSION DIAGNOSIS:  Facial cellulitis [L03.211]  DISCHARGE DIAGNOSIS:  Facial/nose cellulitis DM-2 uncontrolled SECONDARY DIAGNOSIS:   Past Medical History  Diagnosis Date  . Diabetes mellitus without complication ([place])   . Hypertension     HOSPITAL COURSE:  45 year old African-American male admitted for sepsis secondary to cellulitis. 1. Sepsis:  - criteria based on tachycardia and leukocytosis. patient also has a low-grade fever.  -Continue IV vancomycin and Zosyn---Po bactrim (total 10).  2. Cellulitis - patient's face is swollen and tender to his upper eyelids.---improved a lot - He has a scabbed lesion at the tip of his nose which appears vesicular -dried up -Maxillofacial CT negative -Patient also has poor dentition. No abscess felt. -BC neg -WBC 17K--11K Ibuprofen tid for inflammation with meals  3. Diabetes mellitus type 2: The patient's blood sugar is significantly elevated in the setting of infection. - placed him on sliding scale insulin.  -recieved Lantus 20 units daily while in the hospital. HBA1c is 15.9 -creat normal, resumed metformin -spoke with pt. Will start him on lantus 24 units daily and novolog 4 units tid with meals  4. Hypertension: Continue lisinopril with hydrochlorthiazide  5. Pseudohyponatremia: Secondary to hyperglycemia. Aggressively hydrate with intravenous fluid  6. DVT prophylaxis: Lovenox  Overall improved D/c home D/w wife CONSULTS OBTAINED:     DRUG ALLERGIES:   Allergies  Allergen Reactions  . Levaquin [Levofloxacin] Swelling  . Phenergan [Promethazine Hcl] Diarrhea    DISCHARGE MEDICATIONS:    Current Discharge Medication List    START taking these medications   Details  ibuprofen (ADVIL,MOTRIN) 600 MG tablet Take 1 tablet (600 mg total) by mouth 3 (three) times daily with meals. For 1-2 days and then take as needed Qty: 30 tablet, Refills: 0    sodium chloride (OCEAN) 0.65 % SOLN nasal spray Place 1 spray into both nostrils as needed for congestion. Qty: 1 Bottle, Refills: 0    sulfamethoxazole-trimethoprim (BACTRIM DS,SEPTRA DS) 800-160 MG tablet Take 1 tablet by mouth every 12 (twelve) hours. Qty: 14 tablet, Refills: 0      CONTINUE these medications which have NOT CHANGED   Details  lisinopril-hydrochlorothiazide (PRINZIDE,ZESTORETIC) 20-25 MG per tablet Take 1 tablet by mouth daily. Refills: 1    metFORMIN (GLUCOPHAGE) 1000 MG tablet Take 1,000 mg by mouth 2 (two) times daily. Refills: 1      STOP taking these medications     promethazine (PHENERGAN) 25 MG tablet      ranitidine (ZANTAC) 150 MG tablet         If you experience worsening of your admission symptoms, develop shortness of breath, life threatening emergency, suicidal or homicidal thoughts you must seek medical attention immediately by calling 911 or calling your MD immediately  if symptoms less severe.  You Must read complete instructions/literature along with all the possible adverse reactions/side effects for all the Medicines you take and that have been prescribed to you. Take any new Medicines after you have completely understood and accept all the possible adverse reactions/side effects.   Please note  You were cared for by a hospitalist during your hospital stay. If you have any questions about your discharge medications or  the care you received while you were in the hospital after you are discharged, you can call the unit and asked to speak with the hospitalist on call if the hospitalist that took care of you is not available. Once you are discharged, your primary care physician will handle  any further medical issues. Please note that NO REFILLS for any discharge medications will be authorized once you are discharged, as it is imperative that you return to your primary care physician (or establish a relationship with a primary care physician if you do not have one) for your aftercare needs so that they can reassess your need for medications and monitor your lab values. Today   SUBJECTIVE   Feels better now.some vomiting this am  VITAL SIGNS:  Blood pressure 174/96, pulse 88, temperature 98.1 F (36.7 C), temperature source Oral, resp. rate 16, height 6\' 3"  (1.905 m), weight 96.979 kg (213 lb 12.8 oz), SpO2 100 %.  I/O:   Intake/Output Summary (Last 24 hours) at 03/10/15 1115 Last data filed at 03/10/15 0800  Gross per 24 hour  Intake   2126 ml  Output      0 ml  Net   2126 ml    PHYSICAL EXAMINATION:  GENERAL:  45 y.o.-year-old patient lying in the bed with no acute distress.  EYES: Pupils equal, round, reactive to light and accommodation. No scleral icterus. Extraocular muscles intact.  HEENT: Head atraumatic, normocephalic. Oropharynx and nasopharynx clear. Nose cellulitis looks better NECK:  Supple, no jugular venous distention. No thyroid enlargement, no tenderness.  LUNGS: Normal breath sounds bilaterally, no wheezing, rales,rhonchi or crepitation. No use of accessory muscles of respiration.  CARDIOVASCULAR: S1, S2 normal. No murmurs, rubs, or gallops.  ABDOMEN: Soft, non-tender, non-distended. Bowel sounds present. No organomegaly or mass.  EXTREMITIES: No pedal edema, cyanosis, or clubbing.  NEUROLOGIC: Cranial nerves II through XII are intact. Muscle strength 5/5 in all extremities. Sensation intact. Gait not checked.  PSYCHIATRIC: The patient is alert and oriented x 3.  SKIN: No obvious rash, lesion, or ulcer.   DATA REVIEW:   CBC   Recent Labs Lab 03/09/15 0446  WBC 11.3*  HGB 12.0*  HCT 38.2*  PLT 255    Chemistries   Recent Labs Lab  03/06/15 2202 03/08/15 0931 03/09/15 0446  NA 128* 137  --   K 3.8 3.7  --   CL 91* 103  --   CO2 26 24  --   GLUCOSE 599* 326*  --   BUN 20 15  --   CREATININE 1.54* 0.96 1.09  CALCIUM 8.7* 8.6*  --   AST 14*  --   --   ALT 15*  --   --   ALKPHOS 99  --   --   BILITOT 1.3*  --   --     Microbiology Results   Recent Results (from the past 240 hour(s))  Blood culture (routine x 2)     Status: None (Preliminary result)   Collection Time: 03/06/15 10:20 PM  Result Value Ref Range Status   Specimen Description BLOOD LEFT ANTECUBITAL  Final   Special Requests BOTTLES DRAWN AEROBIC AND ANAEROBIC 10ML  Final   Culture NO GROWTH 3 DAYS  Final   Report Status PENDING  Incomplete  Blood culture (routine x 2)     Status: None (Preliminary result)   Collection Time: 03/06/15 10:26 PM  Result Value Ref Range Status   Specimen Description BLOOD RIGHT ANTECUBITAL  Final  Special Requests BOTTLES DRAWN AEROBIC AND ANAEROBIC 13CC  Final   Culture NO GROWTH 3 DAYS  Final   Report Status PENDING  Incomplete    RADIOLOGY:  No results found.   Management plans discussed with the patient, family and they are in agreement.  CODE STATUS:     Code Status Orders        Start     Ordered   03/07/15 0134  Full code   Continuous     03/07/15 0133    Code Status History    Date Active Date Inactive Code Status Order ID Comments User Context   This patient has a current code status but no historical code status.      TOTAL TIME TAKING CARE OF THIS PATIENT: *40 minutes.    Jozi Malachi M.D on 03/10/2015 at 11:15 AM  Between 7am to 6pm - Pager - [phone] After 6pm go to [email or web address] - password [email or web address] [facility]  [facility]  Office  [phone]  CC: Primary care physician; [facility]

## 2015-03-11 LAB — CULTURE, BLOOD (ROUTINE X 2)
Culture: NO GROWTH
Culture: NO GROWTH

## 2015-03-11 LAB — GLUCOSE, CAPILLARY: Glucose-Capillary: 172 mg/dL — ABNORMAL HIGH (ref 65–99)

## 2015-03-11 NOTE — Progress Notes (Signed)
Brief Nutrition Note:   Consult received for Diabetes Nutrition Education. Pt discharged from [facility] on 03/10/2015 per chart review. Education materials mailed to pt on 03/11/2015.  Dwyane Luo, RD, LDN Pager [phone] Weekend/On-Call Pager [phone]

## 2015-04-30 ENCOUNTER — Encounter (INDEPENDENT_AMBULATORY_CARE_PROVIDER_SITE_OTHER): Payer: BLUE CROSS/BLUE SHIELD | Admitting: Ophthalmology

## 2015-04-30 DIAGNOSIS — H43813 Vitreous degeneration, bilateral: Secondary | ICD-10-CM

## 2015-04-30 DIAGNOSIS — E113313 Type 2 diabetes mellitus with moderate nonproliferative diabetic retinopathy with macular edema, bilateral: Secondary | ICD-10-CM

## 2015-04-30 DIAGNOSIS — E11311 Type 2 diabetes mellitus with unspecified diabetic retinopathy with macular edema: Secondary | ICD-10-CM

## 2015-04-30 DIAGNOSIS — H2513 Age-related nuclear cataract, bilateral: Secondary | ICD-10-CM

## 2015-04-30 DIAGNOSIS — H35033 Hypertensive retinopathy, bilateral: Secondary | ICD-10-CM | POA: Diagnosis not present

## 2015-04-30 DIAGNOSIS — I10 Essential (primary) hypertension: Secondary | ICD-10-CM

## 2015-05-01 ENCOUNTER — Encounter (INDEPENDENT_AMBULATORY_CARE_PROVIDER_SITE_OTHER): Payer: BLUE CROSS/BLUE SHIELD | Admitting: Ophthalmology

## 2015-05-01 DIAGNOSIS — E113413 Type 2 diabetes mellitus with severe nonproliferative diabetic retinopathy with macular edema, bilateral: Secondary | ICD-10-CM

## 2015-05-01 DIAGNOSIS — E11311 Type 2 diabetes mellitus with unspecified diabetic retinopathy with macular edema: Secondary | ICD-10-CM | POA: Diagnosis not present

## 2015-05-06 DIAGNOSIS — R002 Palpitations: Secondary | ICD-10-CM | POA: Insufficient documentation

## 2015-05-06 DIAGNOSIS — R0602 Shortness of breath: Secondary | ICD-10-CM | POA: Insufficient documentation

## 2015-05-29 ENCOUNTER — Encounter (INDEPENDENT_AMBULATORY_CARE_PROVIDER_SITE_OTHER): Payer: BLUE CROSS/BLUE SHIELD | Admitting: Ophthalmology

## 2015-06-23 ENCOUNTER — Emergency Department (HOSPITAL_COMMUNITY): Payer: BC Managed Care – PPO

## 2015-06-23 ENCOUNTER — Inpatient Hospital Stay (HOSPITAL_COMMUNITY)
Admission: EM | Admit: 2015-06-23 | Discharge: 2015-06-28 | DRG: 603 | Disposition: A | Discharge status: Home / Self Care | Payer: BC Managed Care – PPO | Attending: Internal Medicine | Admitting: Internal Medicine

## 2015-06-23 ENCOUNTER — Encounter (HOSPITAL_COMMUNITY): Payer: Self-pay | Admitting: Emergency Medicine

## 2015-06-23 DIAGNOSIS — I10 Essential (primary) hypertension: Secondary | ICD-10-CM | POA: Diagnosis present

## 2015-06-23 DIAGNOSIS — A419 Sepsis, unspecified organism: Secondary | ICD-10-CM | POA: Diagnosis present

## 2015-06-23 DIAGNOSIS — E1165 Type 2 diabetes mellitus with hyperglycemia: Secondary | ICD-10-CM | POA: Diagnosis present

## 2015-06-23 DIAGNOSIS — E782 Mixed hyperlipidemia: Secondary | ICD-10-CM | POA: Diagnosis present

## 2015-06-23 DIAGNOSIS — E860 Dehydration: Secondary | ICD-10-CM | POA: Diagnosis present

## 2015-06-23 DIAGNOSIS — E119 Type 2 diabetes mellitus without complications: Secondary | ICD-10-CM

## 2015-06-23 DIAGNOSIS — N179 Acute kidney failure, unspecified: Secondary | ICD-10-CM | POA: Diagnosis not present

## 2015-06-23 DIAGNOSIS — R197 Diarrhea, unspecified: Secondary | ICD-10-CM | POA: Diagnosis present

## 2015-06-23 DIAGNOSIS — Z881 Allergy status to other antibiotic agents status: Secondary | ICD-10-CM

## 2015-06-23 DIAGNOSIS — L03211 Cellulitis of face: Secondary | ICD-10-CM | POA: Diagnosis not present

## 2015-06-23 DIAGNOSIS — N170 Acute kidney failure with tubular necrosis: Secondary | ICD-10-CM | POA: Diagnosis present

## 2015-06-23 DIAGNOSIS — Z794 Long term (current) use of insulin: Secondary | ICD-10-CM

## 2015-06-23 DIAGNOSIS — B9562 Methicillin resistant Staphylococcus aureus infection as the cause of diseases classified elsewhere: Secondary | ICD-10-CM | POA: Diagnosis present

## 2015-06-23 DIAGNOSIS — L989 Disorder of the skin and subcutaneous tissue, unspecified: Secondary | ICD-10-CM | POA: Diagnosis not present

## 2015-06-23 DIAGNOSIS — Z888 Allergy status to other drugs, medicaments and biological substances status: Secondary | ICD-10-CM

## 2015-06-23 DIAGNOSIS — E785 Hyperlipidemia, unspecified: Secondary | ICD-10-CM

## 2015-06-23 DIAGNOSIS — Z79899 Other long term (current) drug therapy: Secondary | ICD-10-CM

## 2015-06-23 HISTORY — DX: Hyperlipidemia, unspecified: E78.5

## 2015-06-23 LAB — CBC WITH DIFFERENTIAL/PLATELET
BASOS ABS: 0 10*3/uL (ref 0.0–0.1)
BASOS PCT: 0 %
Band Neutrophils: 0 %
Blasts: 0 %
EOS ABS: 0.1 10*3/uL (ref 0.0–0.7)
Eosinophils Relative: 1 %
HCT: 39.8 % (ref 39.0–52.0)
HEMOGLOBIN: 12.3 g/dL — AB (ref 13.0–17.0)
Lymphocytes Relative: 28 %
Lymphs Abs: 2.2 10*3/uL (ref 0.7–4.0)
MCH: 22.2 pg — AB (ref 26.0–34.0)
MCHC: 30.9 g/dL (ref 30.0–36.0)
MCV: 71.8 fL — ABNORMAL LOW (ref 78.0–100.0)
METAMYELOCYTES PCT: 0 %
MONO ABS: 0.5 10*3/uL (ref 0.1–1.0)
MYELOCYTES: 0 %
Monocytes Relative: 7 %
NEUTROS PCT: 64 %
NRBC: 0 /100{WBCs}
Neutro Abs: 4.9 10*3/uL (ref 1.7–7.7)
Other: 0 %
PROMYELOCYTES ABS: 0 %
Platelets: 266 10*3/uL (ref 150–400)
RBC: 5.54 MIL/uL (ref 4.22–5.81)
RDW: 13.1 % (ref 11.5–15.5)
WBC: 7.7 10*3/uL (ref 4.0–10.5)

## 2015-06-23 LAB — I-STAT VENOUS BLOOD GAS, ED
Acid-Base Excess: 4 mmol/L — ABNORMAL HIGH (ref 0.0–2.0)
Bicarbonate: 29.5 mEq/L — ABNORMAL HIGH (ref 20.0–24.0)
O2 Saturation: 83 %
PCO2 VEN: 48.2 mmHg (ref 45.0–50.0)
PH VEN: 7.395 — AB (ref 7.250–7.300)
PO2 VEN: 48 mmHg — AB (ref 31.0–45.0)
TCO2: 31 mmol/L (ref 0–100)

## 2015-06-23 LAB — BASIC METABOLIC PANEL
Anion gap: 11 (ref 5–15)
BUN: 16 mg/dL (ref 6–20)
CALCIUM: 9 mg/dL (ref 8.9–10.3)
CO2: 25 mmol/L (ref 22–32)
CREATININE: 1.33 mg/dL — AB (ref 0.61–1.24)
Chloride: 99 mmol/L — ABNORMAL LOW (ref 101–111)
GFR calc non Af Amer: >60 mL/min (ref >60)
Glucose, Bld: 517 mg/dL — ABNORMAL HIGH (ref 65–99)
Potassium: 4.5 mmol/L (ref 3.5–5.1)
SODIUM: 135 mmol/L (ref 135–145)

## 2015-06-23 LAB — I-STAT CG4 LACTIC ACID, ED
LACTIC ACID, VENOUS: 3.06 mmol/L — AB (ref 0.5–2.0)
LACTIC ACID, VENOUS: 0.91 mmol/L (ref 0.5–2.0)

## 2015-06-23 LAB — CBG MONITORING, ED: GLUCOSE-CAPILLARY: 510 mg/dL — AB (ref 65–99)

## 2015-06-23 MED ORDER — SODIUM CHLORIDE 0.9% FLUSH
3.0000 mL | Freq: Two times a day (BID) | INTRAVENOUS | Status: DC
  Administered 2015-06-23 – 2015-06-26 (×5): 3 mL via INTRAVENOUS

## 2015-06-23 MED ORDER — PIPERACILLIN-TAZOBACTAM 3.375 G IVPB
3.3750 g | Freq: Three times a day (TID) | INTRAVENOUS | Status: DC
  Administered 2015-06-24 – 2015-06-26 (×7): 3.375 g via INTRAVENOUS
  Filled 2015-06-23 (×9): qty 50

## 2015-06-23 MED ORDER — PIPERACILLIN-TAZOBACTAM 3.375 G IVPB 30 MIN
3.3750 g | Freq: Once | INTRAVENOUS | Status: AC
  Administered 2015-06-23: 3.375 g via INTRAVENOUS
  Filled 2015-06-23: qty 50

## 2015-06-23 MED ORDER — SODIUM CHLORIDE 0.9 % IV BOLUS (SEPSIS)
1000.0000 mL | Freq: Once | INTRAVENOUS | Status: AC
  Administered 2015-06-23: 1000 mL via INTRAVENOUS

## 2015-06-23 MED ORDER — SODIUM CHLORIDE 0.9 % IV SOLN
INTRAVENOUS | Status: DC
  Filled 2015-06-23: qty 2.5

## 2015-06-23 MED ORDER — ONDANSETRON HCL 4 MG/2ML IJ SOLN: 4.0000 mg | Freq: Three times a day (TID) | INTRAMUSCULAR | Status: DC | PRN

## 2015-06-23 MED ORDER — VANCOMYCIN HCL IN DEXTROSE 750-5 MG/150ML-% IV SOLN
750.0000 mg | Freq: Two times a day (BID) | INTRAVENOUS | Status: DC
  Administered 2015-06-23 – 2015-06-26 (×6): 750 mg via INTRAVENOUS
  Filled 2015-06-23 (×8): qty 150

## 2015-06-23 MED ORDER — DEXTROSE-NACL 5-0.45 % IV SOLN: INTRAVENOUS | Status: DC

## 2015-06-23 MED ORDER — INSULIN GLARGINE 100 UNIT/ML ~~LOC~~ SOLN
30.0000 [IU] | Freq: Every day | SUBCUTANEOUS | Status: DC
  Administered 2015-06-24 (×2): 30 [IU] via SUBCUTANEOUS
  Filled 2015-06-23 (×3): qty 0.3

## 2015-06-23 MED ORDER — ENOXAPARIN SODIUM 40 MG/0.4ML ~~LOC~~ SOLN
40.0000 mg | Freq: Every day | SUBCUTANEOUS | Status: DC
  Administered 2015-06-24 – 2015-06-28 (×5): 40 mg via SUBCUTANEOUS
  Filled 2015-06-23 (×6): qty 0.4

## 2015-06-23 MED ORDER — IOPAMIDOL (ISOVUE-300) INJECTION 61%
INTRAVENOUS | Status: AC
Start: 2015-06-23 — End: 2015-06-23
  Administered 2015-06-23: 20:00:00
  Filled 2015-06-23: qty 75

## 2015-06-23 MED ORDER — ONDANSETRON HCL 4 MG/2ML IJ SOLN
4.0000 mg | Freq: Once | INTRAMUSCULAR | Status: AC
  Administered 2015-06-23: 4 mg via INTRAVENOUS
  Filled 2015-06-23: qty 2

## 2015-06-23 MED ORDER — OXYCODONE-ACETAMINOPHEN 5-325 MG PO TABS
2.0000 | ORAL_TABLET | Freq: Four times a day (QID) | ORAL | Status: DC | PRN
  Administered 2015-06-24 (×2): 2 via ORAL
  Filled 2015-06-23 (×4): qty 2

## 2015-06-23 MED ORDER — ACETAMINOPHEN 325 MG PO TABS
650.0000 mg | ORAL_TABLET | Freq: Four times a day (QID) | ORAL | Status: DC | PRN
  Administered 2015-06-27: 650 mg via ORAL
  Filled 2015-06-23 (×2): qty 2

## 2015-06-23 MED ORDER — SALINE SPRAY 0.65 % NA SOLN: 1.0000 | Freq: Three times a day (TID) | NASAL | Status: DC | PRN

## 2015-06-23 MED ORDER — INSULIN GLARGINE 100 UNIT/ML ~~LOC~~ SOLN: 24.0000 [IU] | Freq: Every day | SUBCUTANEOUS | Status: DC

## 2015-06-23 MED ORDER — SODIUM CHLORIDE 0.9 % IV SOLN
INTRAVENOUS | Status: DC
  Administered 2015-06-23 – 2015-06-28 (×8): via INTRAVENOUS

## 2015-06-23 MED ORDER — HYDROMORPHONE HCL 1 MG/ML IJ SOLN: 1.0000 mg | INTRAMUSCULAR | Status: DC | PRN

## 2015-06-23 MED ORDER — ROSUVASTATIN CALCIUM 40 MG PO TABS
40.0000 mg | ORAL_TABLET | Freq: Every morning | ORAL | Status: DC
  Administered 2015-06-24 – 2015-06-28 (×5): 40 mg via ORAL
  Filled 2015-06-23: qty 2
  Filled 2015-06-23 (×3): qty 1
  Filled 2015-06-23 (×2): qty 2
  Filled 2015-06-23 (×2): qty 1
  Filled 2015-06-23 (×3): qty 2

## 2015-06-23 MED ORDER — MORPHINE SULFATE (PF) 4 MG/ML IV SOLN
4.0000 mg | Freq: Once | INTRAVENOUS | Status: AC
  Administered 2015-06-23: 4 mg via INTRAVENOUS
  Filled 2015-06-23: qty 1

## 2015-06-23 MED ORDER — ACETAMINOPHEN 650 MG RE SUPP: 650.0000 mg | Freq: Four times a day (QID) | RECTAL | Status: DC | PRN

## 2015-06-23 MED ORDER — HYDRALAZINE HCL 20 MG/ML IJ SOLN
5.0000 mg | INTRAMUSCULAR | Status: DC | PRN
  Administered 2015-06-24: 5 mg via INTRAVENOUS
  Filled 2015-06-23: qty 1

## 2015-06-23 MED ORDER — TAMSULOSIN HCL 0.4 MG PO CAPS
0.4000 mg | ORAL_CAPSULE | Freq: Every morning | ORAL | Status: DC
  Administered 2015-06-24 – 2015-06-28 (×5): 0.4 mg via ORAL
  Filled 2015-06-23 (×5): qty 1

## 2015-06-23 MED ORDER — INSULIN ASPART 100 UNIT/ML ~~LOC~~ SOLN
0.0000 [IU] | Freq: Three times a day (TID) | SUBCUTANEOUS | Status: DC
  Administered 2015-06-24: 3 [IU] via SUBCUTANEOUS
  Administered 2015-06-24 (×2): 5 [IU] via SUBCUTANEOUS
  Administered 2015-06-25: 3 [IU] via SUBCUTANEOUS
  Administered 2015-06-25: 2 [IU] via SUBCUTANEOUS
  Administered 2015-06-25: 3 [IU] via SUBCUTANEOUS
  Administered 2015-06-26: 5 [IU] via SUBCUTANEOUS
  Administered 2015-06-26 (×2): 3 [IU] via SUBCUTANEOUS
  Administered 2015-06-27: 5 [IU] via SUBCUTANEOUS
  Administered 2015-06-27: 3 [IU] via SUBCUTANEOUS
  Administered 2015-06-27 – 2015-06-28 (×2): 2 [IU] via SUBCUTANEOUS
  Administered 2015-06-28: 1 [IU] via SUBCUTANEOUS

## 2015-06-23 NOTE — ED Notes (Signed)
Code sepsis @ 19:45

## 2015-06-23 NOTE — ED Notes (Signed)
Pt has pain and swelling to left side of face.  Onset approx 1 1/2 weeks ago.  Swelling noted to left side of face spreading up to left side of scalp and into left side of neck

## 2015-06-23 NOTE — H&P (Signed)
History and Physical    [name]. ZJI:[id number] DOB: 06-05-1970 DOA: 06/23/2015  Referring MD/NP/PA:   PCP: [facility]   Outpatient Specialists: none Patient coming from:  Home    Chief Complaint: left facial lesion and facial swelling  HPI: [name] Saabir Blyth. is a 45 y.o. male with medical history significant of hypertension, hyperlipidemia, diabetes mellitus, who presents with left facial lesion and left facial swelling.  Patient reports that he has been having a painful lesion over his left face close to the left mouth conner. It has has been going on for about 10 days. It has been progressively getting worse. Now his left face is mildly swelling. Patient reports that he has been taking amoxicillin in the past 4 days, which was left over antibiotics. He explains that he had facial swelling on his nose in January 2017 and diagnosed with cellulitis. He was given prescription for amoxicillin by his PCP. He reports that he has diarrhea for two days.  He has had 4 bowel movements with loose stool today. Has mild nausea, but no vomiting or abdominal pain then. Patient does not have fever, chills. He has mild dry cough, but no chest pain or shortness of breath. Patient does not have symptoms of UTI. He reports intermittent episodes of diaphoresis and dizziness. No unilateral weakness in extremities. No vision change or hearing loss.  ED Course: pt was found to have WBC 7.7, temperature 97.9, tachycardia, elevated  Lactate 3.06, acute renal injury with creatinine 1.33. Patient is admitted to inpatient for further evaluation and treatment.  # CT of neck soft tissue showed acute left facial soft tissue swelling and stranding compatible with cellulitis, maximal at the level of the left buccal space. No associated fluid collection or pathologic lymph nodes at this time; underlying dentition appears stable since January, and is somewhat poor along the anterior  mandible.   Review of Systems:   General: no fevers, chills, no changes in body weight, has poor appetite, has fatigue HEENT: no blurry vision, hearing changes or sore throat Pulm: no dyspnea, has mild coughing, no wheezing CV: no chest pain, no palpitations Abd: no nausea, vomiting, abdominal pain, diarrhea, constipation GU: no dysuria, burning on urination, increased urinary frequency, hematuria  Ext: no leg edema Neuro: no unilateral weakness, numbness, or tingling, no vision change or hearing loss Skin: has a painful facial lesion over left face. MSK: No muscle spasm, no deformity, no limitation of range of movement in spin Heme: No easy bruising.  Travel history: No recent long distant travel.  Allergy:  Allergies  Allergen Reactions  . Levaquin [Levofloxacin] Swelling  . Phenergan [Promethazine Hcl] Diarrhea    Past Medical History  Diagnosis Date  . Diabetes mellitus without complication ([place])   . Hypertension   . HLD (hyperlipidemia)     Past Surgical History  Procedure Laterality Date  . Arthroscopic repair acl    . Tonsilectomy/adenoidectomy with myringotomy    . Prostate surgery  2016    Social History:  reports that he has never smoked. He does not have any smokeless tobacco history on file. He reports that he does not drink alcohol or use illicit drugs.  Family History:  Family History  Problem Relation Age of Onset  . CAD Father   . Diabetes Mellitus II Mother      Prior to Admission medications   Medication Sig Start Date End Date Taking? Authorizing Provider  ibuprofen (ADVIL,MOTRIN) 600 MG tablet Take 1  tablet (600 mg total) by mouth 3 (three) times daily with meals. For 1-2 days and then take as needed Patient taking differently: Take 600 mg by mouth 3 (three) times daily as needed for moderate pain.  03/10/15  Yes Fritzi Mandes, MD  insulin aspart (NOVOLOG) 100 UNIT/ML injection Inject 4 Units into the skin 3 (three) times daily before meals. 03/10/15   Yes Fritzi Mandes, MD  insulin glargine (LANTUS) 100 UNIT/ML injection Inject 0.24 mLs (24 Units total) into the skin daily. Patient taking differently: Inject 24 Units into the skin at bedtime.  03/10/15  Yes Fritzi Mandes, MD  lisinopril-hydrochlorothiazide (PRINZIDE,ZESTORETIC) 20-25 MG per tablet Take 1 tablet by mouth daily. 04/16/14  Yes Historical Provider, MD  metFORMIN (GLUCOPHAGE) 1000 MG tablet Take 1,000 mg by mouth 2 (two) times daily. 04/16/14  Yes Historical Provider, MD  rosuvastatin (CRESTOR) 40 MG tablet Take 40 mg by mouth every morning. 03/26/15  Yes Historical Provider, MD  sodium chloride (OCEAN) 0.65 % SOLN nasal spray Place 1 spray into both nostrils as needed for congestion. Patient taking differently: Place 1 spray into both nostrils 3 (three) times daily as needed for congestion.  03/10/15  Yes Fritzi Mandes, MD  tamsulosin (FLOMAX) 0.4 MG CAPS capsule Take 0.4 mg by mouth every morning. 03/26/15  Yes Historical Provider, MD  sulfamethoxazole-trimethoprim (BACTRIM DS,SEPTRA DS) 800-160 MG tablet Take 1 tablet by mouth every 12 (twelve) hours. 03/10/15   Fritzi Mandes, MD    Physical Exam: Filed Vitals:   06/23/15 2115 06/23/15 2230 06/23/15 2323 06/24/15 0037  BP: 167/109 171/115 171/108 177/109  Pulse: 82 82 82   Temp:   98.2 F (36.8 C)   TempSrc:   Oral   Resp:   17   Height:   6' 3"  (1.905 m)   Weight:   107.4 kg (236 lb 12.4 oz)   SpO2: 99% 98% 100%    General: Not in acute distress HEENT:       Eyes: PERRL, EOMI, no scleral icterus.       ENT: No discharge from the ears and nose, no pharynx injection, no tonsillar enlargement.        Neck: No JVD, no bruit, no mass felt. Heme: No neck lymph node enlargement. Cardiac: S1/S2, RRR, No murmurs, No gallops or rubs. Pulm: No rebound pain, no organomegaly, BS present. GU: No hematuria Ext: No pitting leg edema bilaterally. 2+DP/PT pulse bilaterally. Musculoskeletal: No joint deformities, No joint redness or warmth, no  limitation of ROM in spin. Skin: There is sore lesion and swelling to the left cheek, close to left mouth conner. Involving infraorbital area, periauricular area extending to the left neck. Tenderness is the same area. Neuro: Alert, oriented X3, cranial nerves II-XII grossly intact, moves all extremities normally.  Labs on Admission: I have personally reviewed following labs and imaging studies  CBC:  Recent Labs Lab 06/23/15 1810  WBC 7.7  NEUTROABS 4.9  HGB 12.3*  HCT 39.8  MCV 71.8*  PLT 354   Basic Metabolic Panel:  Recent Labs Lab 06/23/15 1810  NA 135  K 4.5  CL 99*  CO2 25  GLUCOSE 517*  BUN 16  CREATININE 1.33*  CALCIUM 9.0   GFR: Estimated Creatinine Clearance: 91 mL/min (by C-G formula based on Cr of 1.33). Liver Function Tests: No results for input(s): AST, ALT, ALKPHOS, BILITOT, PROT, ALBUMIN in the last 168 hours. No results for input(s): LIPASE, AMYLASE in the last 168 hours. No results for input(s): AMMONIA  in the last 168 hours. Coagulation Profile: No results for input(s): INR, PROTIME in the last 168 hours. Cardiac Enzymes: No results for input(s): CKTOTAL, CKMB, CKMBINDEX, TROPONINI in the last 168 hours. BNP (last 3 results) No results for input(s): PROBNP in the last 8760 hours. HbA1C: No results for input(s): HGBA1C in the last 72 hours. CBG:  Recent Labs Lab 06/23/15 1721  GLUCAP 510*   Lipid Profile: No results for input(s): CHOL, HDL, LDLCALC, TRIG, CHOLHDL, LDLDIRECT in the last 72 hours. Thyroid Function Tests: No results for input(s): TSH, T4TOTAL, FREET4, T3FREE, THYROIDAB in the last 72 hours. Anemia Panel: No results for input(s): VITAMINB12, FOLATE, FERRITIN, TIBC, IRON, RETICCTPCT in the last 72 hours. Urine analysis: No results found for: COLORURINE, APPEARANCEUR, LABSPEC, PHURINE, GLUCOSEU, HGBUR, BILIRUBINUR, KETONESUR, PROTEINUR, UROBILINOGEN, NITRITE, LEUKOCYTESUR Sepsis  Labs: @LABRCNTIP (procalcitonin:4,lacticidven:4) )No results found for this or any previous visit (from the past 240 hour(s)).   Radiological Exams on Admission: Ct Soft Tissue Neck W Contrast  06/23/2015  CLINICAL DATA:  45 year old male with left facial cellulitis with soft tissue swelling from the temporal region down through the neck for 4 days. Initial encounter. EXAM: CT NECK WITH CONTRAST TECHNIQUE: Multidetector CT imaging of the neck was performed using the standard protocol following the bolus administration of intravenous contrast. CONTRAST:  75 mL Isovue-300 COMPARISON:  Face CT 03/06/2015.  Head CT 05/20/2014. FINDINGS: Pharynx and larynx: Negative larynx. Pharyngeal soft tissue contours are within normal limits. Stable and negative parapharyngeal and retropharyngeal spaces. Salivary glands: Negative sublingual space. Submandibular glands and parotid glands appear stable and within normal limits. Thyroid: Negative. Lymph nodes: Bilateral cervical lymph nodes are largely stable since January. Level 1A nodes (series 2 1 image 89) appears slightly larger, but remain within normal limits. No cystic or necrotic nodes. There is new confluent soft tissue swelling and stranding along the left face at the level of the dentition/buccal space (series 21, image 67). The changes involve the skin and subcutaneous soft tissues. There is no associated fluid collection. No subcutaneous gas. The dentition here appears stable since January. As before periapical lucency is maximal at the anterior most mandibular dentition bilaterally. Incidental chronic suboccipital scalp scarring is noted and unchanged. Vascular: Major vascular structures in the neck and at the skullbase appear patent, including the left IJ. Limited intracranial: Stable and negative. Visualized orbits: Orbits soft tissues remain normal. Mastoids and visualized paranasal sinuses: Trace paranasal sinus mucosal thickening is stable. Mastoids and  tympanic cavities remain clear. Skeleton: Dentition appears stable and is described above. Chronic enlargement of the left posterior hyoid bone incidentally re- demonstrated. Chronic cervical spine disc and endplate degeneration. No new osseous abnormality. Upper chest: Negative lung apices. No superior mediastinal lymphadenopathy. IMPRESSION: 1. Acute left facial soft tissue swelling and stranding compatible with cellulitis, maximal at the level of the left buccal space. No associated fluid collection or pathologic lymph nodes at this time. 2. Underlying dentition appears stable since January, and is somewhat poor along the anterior mandible. Electronically Signed   By: Genevie [name] M.D.   On: 06/23/2015 21:00     EKG: Not done in ED, will get one.   Assessment/Plan Principal Problem:   Facial cellulitis Active Problems:   Sepsis ([place])   Diabetes mellitus without complication ([place])   HLD (hyperlipidemia)   AKI (acute kidney injury) ([place])   Essential hypertension   Diarrhea   Facial cellulitis and sepsis: pt is septic with elevated and ligated with 3.06, tachycardia. He is hemodynamically stable.  No airway compromise.  - will admit to tele bed for obs - Empiric antimicrobial treatment with vancomycin and Zosyn per pharmacy - PRN Zofran for nausea and Percocet for pain - Blood cultures x 2  - ESR and CRP - wound care consult - will get Procalcitonin and trend lactic acid levels per sepsis protocol. 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 Patient is taking Lantus, NovoLog and metformin at home. Blood sugar is elevated at 517, with normal anion gap. -will increase Lantus dose from 24-30 units daily -SSI  HLD: Last LDL was not on record -Continue home medications: Crestor -Check FLP  AKI: Likely due to prerenal secondary to dehydration and continuation of ACEI, diruetics, NSAIDs - IVF as above - Check FeUrea - Follow up renal  function by BMP - Hold Prinzide, ibuprofen  Essential hypertension: -Hold Prinzide due to acute renal injury -IV hydralazine when necessary  Diarrhea: Likely due to amoxicillin's GI side effects, but cannot rule out other possibilities, such as C. difficile colitis. -Check C diff PCR and GI pathogen panel -IV fluids as above -When necessary Zofran for nausea   DVT ppx: SQ Lovenox Code Status: Full code Family Communication: None at bed side. Disposition Plan:  Anticipate discharge back to previous home environment Consults called:  none Admission status: Obs / tele  Date of Service 06/24/2015    Ivor Costa [facility] Pager [phone]  If 7PM-7AM, please contact night-coverage [email or web address] Password [facility] 06/24/2015, 12:43 AM

## 2015-06-23 NOTE — ED Notes (Signed)
Notified Daralene Milch RN of CBG results 510 mg/dL

## 2015-06-23 NOTE — ED Notes (Signed)
I stat lactic acid results given to S. Joy PA and charge nurse called to move patient to Pod A.

## 2015-06-23 NOTE — ED Notes (Signed)
CBG 326 after 2L NS

## 2015-06-23 NOTE — ED Notes (Signed)
Pt st's he has had a sore on left side of face for approx 1 1/2 weeks.  Has been using OTC meds without relief.

## 2015-06-23 NOTE — ED Provider Notes (Signed)
CSN: [id number]     Arrival date & time 06/23/15  1711 History  By signing my name below, I, [name], attest that this documentation has been prepared under the direction and in the presence of Caston Coopersmith, PA-C. Electronically Signed: Judithann Sauger, ED Scribe. 06/23/2015. 5:49 PM.    Chief Complaint  Patient presents with  . Facial Pain   The history is provided by the patient. No language interpreter was used.   HPI Comments: [name] Jaquaveon Vanos. is a 45 y.o. male with a hx of HTN and DM who presents to the Emergency Department complaining of gradually worsening painful area of swelling to his left lateral face onset one week ago. He reports associated intermittent episodes of diaphoresis and dizziness. No alleviating factors noted. Pt has tried ibuprofen with no relief. He explains that he had facial swelling on his nose in January 2017 and diagnosed with cellulitis. He adds that his blood sugar normally runs between 150 to 200. No fever, chills, n/v, difficulty breathing or swallowing, or any other complaints.  Pt states his blood sugar jumped very high last time he was seen for a similar problem, subsequently diagnosed with cellulitis, and spent several days in the hospital.   Past Medical History  Diagnosis Date  . Diabetes mellitus without complication ([place])   . Hypertension   . HLD (hyperlipidemia)    Past Surgical History  Procedure Laterality Date  . Arthroscopic repair acl    . Tonsilectomy/adenoidectomy with myringotomy    . Prostate surgery  2016   Family History  Problem Relation Age of Onset  . CAD Father   . Diabetes Mellitus II Mother    Social History  Substance Use Topics  . Smoking status: Never Smoker   . Smokeless tobacco: None  . Alcohol Use: No    Review of Systems  Constitutional: Negative for fever and chills.  HENT: Positive for facial swelling.   Gastrointestinal: Negative for nausea and vomiting.  Skin:       Painful area of swelling   All other systems reviewed and are negative.     Allergies  Levaquin and Phenergan  Home Medications   Prior to Admission medications   Medication Sig Start Date End Date Taking? Authorizing Provider  ibuprofen (ADVIL,MOTRIN) 600 MG tablet Take 1 tablet (600 mg total) by mouth 3 (three) times daily with meals. For 1-2 days and then take as needed Patient taking differently: Take 600 mg by mouth 3 (three) times daily as needed for moderate pain.  03/10/15  Yes Fritzi Mandes, MD  insulin aspart (NOVOLOG) 100 UNIT/ML injection Inject 4 Units into the skin 3 (three) times daily before meals. 03/10/15  Yes Fritzi Mandes, MD  insulin glargine (LANTUS) 100 UNIT/ML injection Inject 0.24 mLs (24 Units total) into the skin daily. Patient taking differently: Inject 24 Units into the skin at bedtime.  03/10/15  Yes Fritzi Mandes, MD  lisinopril-hydrochlorothiazide (PRINZIDE,ZESTORETIC) 20-25 MG per tablet Take 1 tablet by mouth daily. 04/16/14  Yes Historical Provider, MD  metFORMIN (GLUCOPHAGE) 1000 MG tablet Take 1,000 mg by mouth 2 (two) times daily. 04/16/14  Yes Historical Provider, MD  rosuvastatin (CRESTOR) 40 MG tablet Take 40 mg by mouth every morning. 03/26/15  Yes Historical Provider, MD  sodium chloride (OCEAN) 0.65 % SOLN nasal spray Place 1 spray into both nostrils as needed for congestion. Patient taking differently: Place 1 spray into both nostrils 3 (three) times daily as needed for congestion.  03/10/15  Yes Fritzi Mandes,  MD  tamsulosin (FLOMAX) 0.4 MG CAPS capsule Take 0.4 mg by mouth every morning. 03/26/15  Yes Historical Provider, MD  sulfamethoxazole-trimethoprim (BACTRIM DS,SEPTRA DS) 800-160 MG tablet Take 1 tablet by mouth every 12 (twelve) hours. 03/10/15   Fritzi Mandes, MD   BP 169/100 mmHg  Pulse 85  Temp(Src) 97.9 F (36.6 C) (Oral)  Resp 16  Ht 5\' 10"  (1.778 m)  Wt 103.42 kg  BMI 32.71 kg/m2  SpO2 99% Physical Exam  Constitutional: He appears well-developed and well-nourished. No  distress.  HENT:  Head: Normocephalic and atraumatic.  Mouth/Throat: Oropharynx is clear and moist.  Noticeable swelling to the left cheek, infraorbital area, periauricular area extending to the left neck and into the left side of the scalp. Swelling noted to extend into the left buccal surface. Tenderness with the same.   Eyes: Conjunctivae and EOM are normal. Pupils are equal, round, and reactive to light.  Neck: Normal range of motion. Neck supple.  Cardiovascular: Normal rate, regular rhythm, normal heart sounds and intact distal pulses.   Pulmonary/Chest: Effort normal and breath sounds normal. No respiratory distress.  Abdominal: Soft. There is no tenderness. There is no guarding.  Musculoskeletal: He exhibits no edema or tenderness.  Lymphadenopathy:    He has no cervical adenopathy.  Neurological: He is alert.  Skin: Skin is warm and dry. He is not diaphoretic.  Psychiatric: He has a normal mood and affect. His behavior is normal.  Nursing note and vitals reviewed.   ED Course  Procedures (including critical care time) DIAGNOSTIC STUDIES: Oxygen Saturation is 100% on RA, normal by my interpretation.    COORDINATION OF CARE: 5:45 PM- Pt advised of plan for treatment and pt agrees. Pt informed of CBG results. He will receive a CT scan for further evaluation.    Labs Review Labs Reviewed  BASIC METABOLIC PANEL - Abnormal; Notable for the following:    Chloride 99 (*)    Glucose, Bld 517 (*)    Creatinine, Ser 1.33 (*)    All other components within normal limits  CBC WITH DIFFERENTIAL/PLATELET - Abnormal; Notable for the following:    Hemoglobin 12.3 (*)    MCV 71.8 (*)    MCH 22.2 (*)    All other components within normal limits  CBG MONITORING, ED - Abnormal; Notable for the following:    Glucose-Capillary 510 (*)    All other components within normal limits  I-STAT CG4 LACTIC ACID, ED - Abnormal; Notable for the following:    Lactic Acid, Venous 3.06 (*)    All  other components within normal limits  I-STAT VENOUS BLOOD GAS, ED - Abnormal; Notable for the following:    pH, Ven 7.395 (*)    pO2, Ven 48.0 (*)    Bicarbonate 29.5 (*)    Acid-Base Excess 4.0 (*)    All other components within normal limits  CULTURE, BLOOD (ROUTINE X 2)  CULTURE, BLOOD (ROUTINE X 2)  C DIFFICILE QUICK SCREEN W PCR REFLEX  GASTROINTESTINAL PANEL BY PCR, STOOL (REPLACES STOOL CULTURE)  BLOOD GAS, VENOUS  CREATININE, URINE, RANDOM  UREA NITROGEN, URINE  BASIC METABOLIC PANEL  CBC  LIPID PANEL  I-STAT CG4 LACTIC ACID, ED    Imaging Review Ct Soft Tissue Neck W Contrast  06/23/2015  CLINICAL DATA:  45 year old male with left facial cellulitis with soft tissue swelling from the temporal region down through the neck for 4 days. Initial encounter. EXAM: CT NECK WITH CONTRAST TECHNIQUE: Multidetector CT imaging of  the neck was performed using the standard protocol following the bolus administration of intravenous contrast. CONTRAST:  75 mL Isovue-300 COMPARISON:  Face CT 03/06/2015.  Head CT 05/20/2014. FINDINGS: Pharynx and larynx: Negative larynx. Pharyngeal soft tissue contours are within normal limits. Stable and negative parapharyngeal and retropharyngeal spaces. Salivary glands: Negative sublingual space. Submandibular glands and parotid glands appear stable and within normal limits. Thyroid: Negative. Lymph nodes: Bilateral cervical lymph nodes are largely stable since January. Level 1A nodes (series 2 1 image 89) appears slightly larger, but remain within normal limits. No cystic or necrotic nodes. There is new confluent soft tissue swelling and stranding along the left face at the level of the dentition/buccal space (series 21, image 67). The changes involve the skin and subcutaneous soft tissues. There is no associated fluid collection. No subcutaneous gas. The dentition here appears stable since January. As before periapical lucency is maximal at the anterior most  mandibular dentition bilaterally. Incidental chronic suboccipital scalp scarring is noted and unchanged. Vascular: Major vascular structures in the neck and at the skullbase appear patent, including the left IJ. Limited intracranial: Stable and negative. Visualized orbits: Orbits soft tissues remain normal. Mastoids and visualized paranasal sinuses: Trace paranasal sinus mucosal thickening is stable. Mastoids and tympanic cavities remain clear. Skeleton: Dentition appears stable and is described above. Chronic enlargement of the left posterior hyoid bone incidentally re- demonstrated. Chronic cervical spine disc and endplate degeneration. No new osseous abnormality. Upper chest: Negative lung apices. No superior mediastinal lymphadenopathy. IMPRESSION: 1. Acute left facial soft tissue swelling and stranding compatible with cellulitis, maximal at the level of the left buccal space. No associated fluid collection or pathologic lymph nodes at this time. 2. Underlying dentition appears stable since January, and is somewhat poor along the anterior mandible. Electronically Signed   By: Genevie [name] M.D.   On: 06/23/2015 21:00      Sepsis - Repeat Assessment  Performed at:    2100  Vitals     Blood pressure 169/100, pulse 85, temperature 97.9 F (36.6 C), temperature source Oral, resp. rate 16, height 5\' 10"  (1.778 m), weight 103.42 kg, SpO2 99 %.  Heart:     Regular rate and rhythm  Lungs:    CTA  Capillary Refill:   <2 sec  Peripheral Pulse:   Radial pulse palpable  Skin:     Normal Color  Arlean Hopping, PA-C has personally reviewed and evaluated these images and lab results as part of his medical decision-making.  Medications  piperacillin-tazobactam (ZOSYN) IVPB 3.375 g (not administered)  vancomycin (VANCOCIN) IVPB 750 mg/150 ml premix (750 mg Intravenous Given 06/23/15 2025)  HYDROmorphone (DILAUDID) injection 1 mg (not administered)  ondansetron (ZOFRAN) injection 4 mg (not administered)  sodium  chloride 0.9 % bolus 1,000 mL (not administered)  rosuvastatin (CRESTOR) tablet 40 mg (not administered)  tamsulosin (FLOMAX) capsule 0.4 mg (not administered)  sodium chloride (OCEAN) 0.65 % nasal spray 1 spray (not administered)  hydrALAZINE (APRESOLINE) injection 5 mg (not administered)  oxyCODONE-acetaminophen (PERCOCET/ROXICET) 5-325 MG per tablet 2 tablet (not administered)  0.9 %  sodium chloride infusion (not administered)  insulin aspart (novoLOG) injection 0-9 Units (not administered)  insulin glargine (LANTUS) injection 30 Units (not administered)  enoxaparin (LOVENOX) injection 40 mg (not administered)  sodium chloride flush (NS) 0.9 % injection 3 mL (not administered)  acetaminophen (TYLENOL) tablet 650 mg (not administered)    Or  acetaminophen (TYLENOL) suppository 650 mg (not administered)  morphine 4 MG/ML injection 4 mg (  4 mg Intravenous Given 06/23/15 1819)  ondansetron (ZOFRAN) injection 4 mg (4 mg Intravenous Given 06/23/15 1819)  sodium chloride 0.9 % bolus 1,000 mL (0 mLs Intravenous Stopped 06/23/15 1947)  sodium chloride 0.9 % bolus 1,000 mL (1,000 mLs Intravenous New Bag/Given 06/23/15 1946)  iopamidol (ISOVUE-300) 61 % injection (  Contrast Given 06/23/15 2016)  piperacillin-tazobactam (ZOSYN) IVPB 3.375 g (3.375 g Intravenous New Bag/Given 06/23/15 2014)   Orders Placed This Encounter  Procedures  . Culture, blood (routine x 2)  . C difficile quick scan w PCR reflex  . Gastrointestinal Panel by PCR , Stool  . CT Soft Tissue Neck W Contrast  . Basic metabolic panel  . CBC with Differential  . Blood gas, venous  . Creatinine, urine, random  . Urea nitrogen, urine  . Basic metabolic panel  . CBC  . Lipid panel  . Diet heart healthy/carb modified Room service appropriate?: Yes; Fluid consistency:: Thin  . Re-check Vital Signs  . Check temperature  . Vital signs  . Up with assistance  . STAT CBG when hypoglycemia is suspected. If treated, recheck every 15 minutes  after each treatment until CBG >/= 70 mg/dl  . Refer to Hypoglycemia Protocol Sidebar Report for treatment of CBG < 70 mg/dl  . No HS correction Insulin  . Vital signs  . Notify physician  . RN may order General Admission PRN Orders (through manage orders) for the following patient needs:  allergy symptoms (Claritin), cold sores (Carmex), cough (Robitussin DM), eye irritation (Liquifilm Tears), hemorrhoids (Tucks), indigestion (Maalox), minor skin irritation (hydrocortisone cream), muscle pain Suezanne Jacquet Gay), nose irritation (saline nasal spray) and sore throat (Chloraseptic spray).  . Cardiac monitoring  . Maintain IV access  . Up as tolerated  . May go off telemetry for tests/procedures  . If lactate (lactic acid) >2, verify repeat lactic acid order has been placed.  . Full code  . piperacillin-tazobactam (ZOSYN) per pharmacy consult  . Consult to wound, ostomy, continence  . vancomycin per pharmacy consult  . Enteric precautions (UV disinfection)  . Oxygen therapy Mode or (Route): Nasal cannula; Liters Per Minute: 2; Keep 02 saturation: greater than 92 %  . POC CBG, ED  . I-Stat CG4 Lactic Acid, ED  . I-Stat venous blood gas, ED  . EKG 12-Lead  . Saline lock IV  . Place in observation (patient's expected length of stay will be less than 2 midnights)  . Place in observation (patient's expected length of stay will be less than 2 midnights)    MDM   Final diagnoses:  Facial cellulitis  Diabetes mellitus without complication ([place])  Essential hypertension  HLD (hyperlipidemia)    [name] [name]. presents with left-sided facial swelling and tenderness beginning about a week ago.  Findings and plan of care discussed with Daleen Bo, MD.   This patient's presentation is suspicious for a facial cellulitis. This, combined with the patient's diabetes and current hyperglycemia, puts patient at increased risk for more serious infection. Patient noted to have increased lactic acid,  combined with the patient's known source of infection, qualifies him for sepsis. Code sepsis initiated. Patient's blood sugar also addressed.  6:04 PM CT tech called to say that a CT soft tissue will be adequate to view the soft tissues of the neck, face, and head. Maxillofacial CT canceled. 9:05 PM Spoke with Dr. Blaine Hamper, hospitalist, who agreed to admit the patient to telemetry observation.  Filed Vitals:   06/23/15 1716 06/23/15 2011 06/23/15 2045  BP: 179/90 156/107 169/100  Pulse: 104 86 85  Temp: 98.6 F (37 C) 97.9 F (36.6 C)   TempSrc:  Oral   Resp: 18 16   Height: 5\' 10"  (1.778 m)    Weight: 103.42 kg    SpO2: 100% 100% 99%     I personally performed the services described in this documentation, which was scribed in my presence. The recorded information has been reviewed and is accurate.   Lorayne Bender, PA-C 06/23/15 2159  Daleen Bo, MD 06/23/15 2223

## 2015-06-23 NOTE — Progress Notes (Signed)
Pharmacy Antibiotic Note  [name]. is a 45 y.o. male admitted on 06/23/2015 with cellulitis.  Pharmacy has been consulted for zosyn and vancomycin dosing.  Plan: Vancomycin 750 IV every 12 hours.  Goal trough 10-15 mcg/mL. Zosyn 3.375g IV q8h (4 hour infusion).  Height: 5\' 10"  (177.8 cm) Weight: 228 lb (103.42 kg) IBW/kg (Calculated) : 73  Temp (24hrs), Avg:98.6 F (37 C), Min:98.6 F (37 C), Max:98.6 F (37 C)   Recent Labs Lab 06/23/15 1810 06/23/15 1824  WBC 7.7  --   CREATININE 1.33*  --   LATICACIDVEN  --  3.06*    Estimated Creatinine Clearance: 82.7 mL/min (by C-G formula based on Cr of 1.33).    Allergies  Allergen Reactions  . Levaquin [Levofloxacin] Swelling  . Phenergan [Promethazine Hcl] Diarrhea    Antimicrobials this admission: Zosyn 5/1>> Vancomycin 5/1>>  Dose adjustments this admission: None  Microbiology results: Blood x 2 5/1>>  Levester Fresh, PharmD, BCPS, [facility] Clinical Pharmacist Pager [phone] 06/23/2015 7:42 PM

## 2015-06-24 DIAGNOSIS — R197 Diarrhea, unspecified: Secondary | ICD-10-CM | POA: Diagnosis present

## 2015-06-24 DIAGNOSIS — L989 Disorder of the skin and subcutaneous tissue, unspecified: Secondary | ICD-10-CM | POA: Diagnosis present

## 2015-06-24 DIAGNOSIS — B9562 Methicillin resistant Staphylococcus aureus infection as the cause of diseases classified elsewhere: Secondary | ICD-10-CM | POA: Diagnosis present

## 2015-06-24 DIAGNOSIS — Z881 Allergy status to other antibiotic agents status: Secondary | ICD-10-CM | POA: Diagnosis not present

## 2015-06-24 DIAGNOSIS — Z79899 Other long term (current) drug therapy: Secondary | ICD-10-CM | POA: Diagnosis not present

## 2015-06-24 DIAGNOSIS — E119 Type 2 diabetes mellitus without complications: Secondary | ICD-10-CM | POA: Diagnosis not present

## 2015-06-24 DIAGNOSIS — L03211 Cellulitis of face: Secondary | ICD-10-CM | POA: Diagnosis present

## 2015-06-24 DIAGNOSIS — Z888 Allergy status to other drugs, medicaments and biological substances status: Secondary | ICD-10-CM | POA: Diagnosis not present

## 2015-06-24 DIAGNOSIS — L0201 Cutaneous abscess of face: Secondary | ICD-10-CM | POA: Diagnosis not present

## 2015-06-24 DIAGNOSIS — Z794 Long term (current) use of insulin: Secondary | ICD-10-CM | POA: Diagnosis not present

## 2015-06-24 DIAGNOSIS — E860 Dehydration: Secondary | ICD-10-CM | POA: Diagnosis present

## 2015-06-24 DIAGNOSIS — A419 Sepsis, unspecified organism: Secondary | ICD-10-CM | POA: Diagnosis not present

## 2015-06-24 DIAGNOSIS — E1165 Type 2 diabetes mellitus with hyperglycemia: Secondary | ICD-10-CM | POA: Diagnosis present

## 2015-06-24 DIAGNOSIS — N179 Acute kidney failure, unspecified: Secondary | ICD-10-CM | POA: Diagnosis present

## 2015-06-24 DIAGNOSIS — E785 Hyperlipidemia, unspecified: Secondary | ICD-10-CM | POA: Diagnosis present

## 2015-06-24 DIAGNOSIS — I10 Essential (primary) hypertension: Secondary | ICD-10-CM | POA: Diagnosis present

## 2015-06-24 LAB — BASIC METABOLIC PANEL
ANION GAP: 7 (ref 5–15)
BUN: 13 mg/dL (ref 6–20)
CHLORIDE: 105 mmol/L (ref 101–111)
CO2: 25 mmol/L (ref 22–32)
Calcium: 8.4 mg/dL — ABNORMAL LOW (ref 8.9–10.3)
Creatinine, Ser: 1.01 mg/dL (ref 0.61–1.24)
GFR calc Af Amer: >60 mL/min (ref >60)
GLUCOSE: 315 mg/dL — AB (ref 65–99)
Potassium: 3.9 mmol/L (ref 3.5–5.1)
Sodium: 137 mmol/L (ref 135–145)

## 2015-06-24 LAB — CBC
HEMATOCRIT: 36.8 % — AB (ref 39.0–52.0)
Hemoglobin: 11.3 g/dL — ABNORMAL LOW (ref 13.0–17.0)
MCH: 22.1 pg — ABNORMAL LOW (ref 26.0–34.0)
MCHC: 30.7 g/dL (ref 30.0–36.0)
MCV: 71.9 fL — AB (ref 78.0–100.0)
PLATELETS: 221 10*3/uL (ref 150–400)
RBC: 5.12 MIL/uL (ref 4.22–5.81)
RDW: 13.2 % (ref 11.5–15.5)
WBC: 7.5 10*3/uL (ref 4.0–10.5)

## 2015-06-24 LAB — LIPID PANEL
Cholesterol: 100 mg/dL (ref 0–200)
HDL: 38 mg/dL — AB (ref >40)
LDL CALC: 43 mg/dL (ref 0–99)
Total CHOL/HDL Ratio: 2.6 RATIO
Triglycerides: 94 mg/dL (ref <150)
VLDL: 19 mg/dL (ref 0–40)

## 2015-06-24 LAB — GLUCOSE, CAPILLARY
GLUCOSE-CAPILLARY: 271 mg/dL — AB (ref 65–99)
GLUCOSE-CAPILLARY: 280 mg/dL — AB (ref 65–99)
Glucose-Capillary: 326 mg/dL — ABNORMAL HIGH (ref 65–99)
Glucose-Capillary: 264 mg/dL — ABNORMAL HIGH (ref 65–99)
Glucose-Capillary: 218 mg/dL — ABNORMAL HIGH (ref 65–99)
Glucose-Capillary: 284 mg/dL — ABNORMAL HIGH (ref 65–99)

## 2015-06-24 LAB — SEDIMENTATION RATE: Sed Rate: 22 mm/hr — ABNORMAL HIGH (ref 0–16)

## 2015-06-24 LAB — C-REACTIVE PROTEIN: CRP: 0.6 mg/dL (ref <1.0)

## 2015-06-24 MED ORDER — ONDANSETRON HCL 4 MG/2ML IJ SOLN
4.0000 mg | Freq: Four times a day (QID) | INTRAMUSCULAR | Status: DC | PRN
  Administered 2015-06-24 – 2015-06-26 (×4): 4 mg via INTRAVENOUS
  Filled 2015-06-24 (×4): qty 2

## 2015-06-24 NOTE — Progress Notes (Signed)
Pt actively vomiting; contents include pt dinner that he has just eaten. Paged Dr. Marthenia Rolling for antiemetic; awaiting return page and/or further orders.

## 2015-06-24 NOTE — Consult Note (Signed)
WOC wound consult note Reason for Consult: left lower cheek wound, patient reports started about a week ago Wound type: unclear etiology, with associated cellulitis Measurement: unable to measure open area Wound bed: crusted with dry drainage, patient to tender for me to really try to express any drainage.  Drainage (amount, consistency, odor) appears serous/brown Periwound:patient has facial hair that prevents assessment of skin, however area is indurated Dressing procedure/placement/frequency: No topical care at this time, due to facial hair would not be able adhere any dressing to face.   Discussed POC with patient and bedside nurse.  Re consult if needed, will not follow at this time. Thanks  Taneka Espiritu [place], [place] [phone])

## 2015-06-24 NOTE — Progress Notes (Addendum)
Inpatient Diabetes Program Recommendations  AACE/ADA: New Consensus Statement on Inpatient Glycemic Control (2015)  Target Ranges:  Prepandial:   less than 140 mg/dL      Peak postprandial:   less than 180 mg/dL (1-2 hours)      Critically ill patients:  140 - 180 mg/dL   Results for [name], [name] (MRN [id number]) as of 06/24/2015 08:58  Ref. Range 06/23/2015 17:21 06/23/2015 21:27 06/23/2015 23:31 06/24/2015 08:53  Glucose-Capillary Latest Ref Range: 65-99 mg/dL 510 (H) 326 (H) 280 (H) 271 (H)   Review of Glycemic Control  Diabetes history: DM 2 Outpatient Diabetes medications: Metformin 1,000 mg BID, Lantus 24 units, Novolog 4 units TID with meals Current orders for Inpatient glycemic control: Novolog Sensitive TID, Lantus 30 units QHS  Inpatient Diabetes Program Recommendations:  Insulin - Basal: Fasting lab glucose 315 mg/dl, finger stick 271 mg/dl this am. Please consider increasing basal insulin to 36 units. Correction (SSI): Consider increasing correction to Novolog Moderate TID + HS scale. HgbA1C: Consider obtaining another A1c for glucose control over the past 2-3 months since patient was newley d/c'd on insulin last admission due to high A1c >15% on 03/06/15.  Due to elevated glucose levels and d/c summary from last admission. Unsure if patient actually got his insulin at discharge.  Thanks,  Tama Headings RN, MSN, [facility] Inpatient Diabetes Coordinator Team Pager [phone] (8a-5p)

## 2015-06-24 NOTE — Progress Notes (Signed)
New Admission Note:  Arrival Method: via stretcher with ED Mental Orientation: Alert and oriented x4 Telemetry: [address] Assessment: Completed Skin: left side facial swelling IV: Left AC, right forearm Pain: see MAR Tubes: N/A Safety Measures: Safety Fall Prevention Plan discussed. Admission: Completed [address] Orientation: Patient has been orientated to the room, unit and the staff. Family:none at bedside  Orders have been reviewed and implemented. Will continue to monitor the patient. Call light has been placed within reach and bed alarm has been activated.   Leandro Reasoner BSN, RN  Phone Number: [phone] [facility]

## 2015-06-24 NOTE — H&P (Signed)
PROGRESS NOTE    [name].  RV:[id number] DOB: Jun 18, 1970 DOA: 06/23/2015 PCP: [facility] (Confirm with patient/family/NH records and if not entered, this HAS to be entered at [facility] point of entry. "No PCP" if truly none.) Outpatient Specialists: Theme park manager speciality and name if known)    Brief Narrative: 45 y.o. male with medical history significant of hypertension, hyperlipidemia, diabetes mellitus, who presents with left facial lesion and left facial swelling/Cellulitis that did not respond to amoxicillin. The patient reports prior episode of facial cellulitis in January of this year. Patient is currently on treatment for facial cellulitis.  Assessment & Plan:   Principal Problem:   Facial cellulitis Active Problems:   Sepsis ([place])   Diabetes mellitus without complication ([place])   HLD (hyperlipidemia)   AKI (acute kidney injury) ([place])   Essential hypertension   Diarrhea  Facial cellulitis and sepsis: Continue antibiotics. CT soft tissue of neck finding is noted. Culture wound. DM-II: Optimize. HLD  AKI, Mild, resolved significantly.   Essential hypertension: Optimize Diarrhea: Follow work up.   DVT ppx: SQ Lovenox Code Status: Full code Family Communication: None at bed side. Disposition Plan: Anticipate discharge back to previous home environment Consults called: none  Antimicrobials: Vanc and Zosyn  Subjective: Nil new complaints. Left facial tenderness on palpation. Tachycardia noted.  Objective: Filed Vitals:   06/24/15 0037 06/24/15 0445 06/24/15 0847 06/24/15 1821  BP: 177/109 166/98 153/63 160/102  Pulse:  82 71 89  Temp:  98 F (36.7 C) 98.9 F (37.2 C) 98.6 F (37 C)  TempSrc:  Oral Oral Oral  Resp:  18 17 16   Height:      Weight:      SpO2:  99% 99% 98%    Intake/Output Summary (Last 24 hours) at 06/24/15 1839 Last data filed at 06/24/15 1456  Gross per 24 hour  Intake 2776.67 ml  Output    475 ml  Net  2301.67 ml   Filed Weights   06/23/15 1716 06/23/15 2323  Weight: 103.42 kg (228 lb) 107.4 kg (236 lb 12.4 oz)    Examination:  General exam: Appears calm and comfortable  HEENT - Severe tenderness on palpation of left side of face, with some papular lesions Respiratory system: Clear to auscultation. Respiratory effort normal. Cardiovascular system: S1 & S2, tachycardia. Gastrointestinal system: Abdomen is nondistended  Central nervous system: Alert and oriented. No focal neurological deficits. Extremities: Symmetric 5 x 5 power.   Data Reviewed: I have personally reviewed following labs and imaging studies  CBC:  Recent Labs Lab 06/23/15 1810 06/24/15 0508  WBC 7.7 7.5  NEUTROABS 4.9  --   HGB 12.3* 11.3*  HCT 39.8 36.8*  MCV 71.8* 71.9*  PLT 266 [id number]   Basic Metabolic Panel:  Recent Labs Lab 06/23/15 1810 06/24/15 0508  NA 135 137  K 4.5 3.9  CL 99* 105  CO2 25 25  GLUCOSE 517* 315*  BUN 16 13  CREATININE 1.33* 1.01  CALCIUM 9.0 8.4*   GFR: Estimated Creatinine Clearance: 119.8 mL/min (by C-G formula based on Cr of 1.01). Liver Function Tests: No results for input(s): AST, ALT, ALKPHOS, BILITOT, PROT, ALBUMIN in the last 168 hours. No results for input(s): LIPASE, AMYLASE in the last 168 hours. No results for input(s): AMMONIA in the last 168 hours. Coagulation Profile: No results for input(s): INR, PROTIME in the last 168 hours. Cardiac Enzymes: No results for input(s): CKTOTAL, CKMB, CKMBINDEX, TROPONINI in the last 168 hours. BNP (  last 3 results) No results for input(s): PROBNP in the last 8760 hours. HbA1C: No results for input(s): HGBA1C in the last 72 hours. CBG:  Recent Labs Lab 06/23/15 2127 06/23/15 2331 06/24/15 0853 06/24/15 1212 06/24/15 1819  GLUCAP 326* 280* 271* 264* 218*   Lipid Profile:  Recent Labs  06/24/15 0508  CHOL 100  HDL 38*  LDLCALC 43  TRIG 94  CHOLHDL 2.6   Thyroid Function Tests: No results for input(s):  TSH, T4TOTAL, FREET4, T3FREE, THYROIDAB in the last 72 hours. Anemia Panel: No results for input(s): VITAMINB12, FOLATE, FERRITIN, TIBC, IRON, RETICCTPCT in the last 72 hours. Urine analysis: No results found for: COLORURINE, APPEARANCEUR, LABSPEC, PHURINE, GLUCOSEU, HGBUR, BILIRUBINUR, KETONESUR, PROTEINUR, UROBILINOGEN, NITRITE, LEUKOCYTESUR Sepsis Labs: @LABRCNTIP (procalcitonin:4,lacticidven:4)  ) Recent Results (from the past 240 hour(s))  Culture, blood (routine x 2)     Status: None (Preliminary result)   Collection Time: 06/23/15  7:39 PM  Result Value Ref Range Status   Specimen Description BLOOD RIGHT ANTECUBITAL  Final   Special Requests BOTTLES DRAWN AEROBIC AND ANAEROBIC 5CC  Final   Culture NO GROWTH < 24 HOURS  Final   Report Status PENDING  Incomplete  Culture, blood (routine x 2)     Status: None (Preliminary result)   Collection Time: 06/23/15  7:44 PM  Result Value Ref Range Status   Specimen Description BLOOD RIGHT HAND  Final   Special Requests BOTTLES DRAWN AEROBIC AND ANAEROBIC 5CC  Final   Culture NO GROWTH < 24 HOURS  Final   Report Status PENDING  Incomplete         Radiology Studies: Ct Soft Tissue Neck W Contrast  06/23/2015  CLINICAL DATA:  45 year old male with left facial cellulitis with soft tissue swelling from the temporal region down through the neck for 4 days. Initial encounter. EXAM: CT NECK WITH CONTRAST TECHNIQUE: Multidetector CT imaging of the neck was performed using the standard protocol following the bolus administration of intravenous contrast. CONTRAST:  75 mL Isovue-300 COMPARISON:  Face CT 03/06/2015.  Head CT 05/20/2014. FINDINGS: Pharynx and larynx: Negative larynx. Pharyngeal soft tissue contours are within normal limits. Stable and negative parapharyngeal and retropharyngeal spaces. Salivary glands: Negative sublingual space. Submandibular glands and parotid glands appear stable and within normal limits. Thyroid: Negative. Lymph  nodes: Bilateral cervical lymph nodes are largely stable since January. Level 1A nodes (series 2 1 image 89) appears slightly larger, but remain within normal limits. No cystic or necrotic nodes. There is new confluent soft tissue swelling and stranding along the left face at the level of the dentition/buccal space (series 21, image 67). The changes involve the skin and subcutaneous soft tissues. There is no associated fluid collection. No subcutaneous gas. The dentition here appears stable since January. As before periapical lucency is maximal at the anterior most mandibular dentition bilaterally. Incidental chronic suboccipital scalp scarring is noted and unchanged. Vascular: Major vascular structures in the neck and at the skullbase appear patent, including the left IJ. Limited intracranial: Stable and negative. Visualized orbits: Orbits soft tissues remain normal. Mastoids and visualized paranasal sinuses: Trace paranasal sinus mucosal thickening is stable. Mastoids and tympanic cavities remain clear. Skeleton: Dentition appears stable and is described above. Chronic enlargement of the left posterior hyoid bone incidentally re- demonstrated. Chronic cervical spine disc and endplate degeneration. No new osseous abnormality. Upper chest: Negative lung apices. No superior mediastinal lymphadenopathy. IMPRESSION: 1. Acute left facial soft tissue swelling and stranding compatible with cellulitis, maximal at the level  of the left buccal space. No associated fluid collection or pathologic lymph nodes at this time. 2. Underlying dentition appears stable since January, and is somewhat poor along the anterior mandible. Electronically Signed   By: Genevie [name] M.D.   On: 06/23/2015 21:00        Scheduled Meds: . enoxaparin (LOVENOX) injection  40 mg Subcutaneous Daily  . insulin aspart  0-9 Units Subcutaneous TID WC  . insulin glargine  30 Units Subcutaneous QHS  . piperacillin-tazobactam (ZOSYN)  IV  3.375 g  Intravenous Q8H  . rosuvastatin  40 mg Oral q morning - 10a  . sodium chloride flush  3 mL Intravenous Q12H  . tamsulosin  0.4 mg Oral q morning - 10a  . vancomycin  750 mg Intravenous Q12H   Continuous Infusions: . sodium chloride 100 mL/hr at 06/24/15 1657        Time spent: 30 Mins    Bonnell Public, MD [facility] Pager [phone]  If 7PM-7AM, please contact night-coverage [email or web address] Password [facility] 06/24/2015, 6:39 PM

## 2015-06-25 LAB — GLUCOSE, CAPILLARY
GLUCOSE-CAPILLARY: 221 mg/dL — AB (ref 65–99)
GLUCOSE-CAPILLARY: 204 mg/dL — AB (ref 65–99)
Glucose-Capillary: 195 mg/dL — ABNORMAL HIGH (ref 65–99)
Glucose-Capillary: 239 mg/dL — ABNORMAL HIGH (ref 65–99)

## 2015-06-25 MED ORDER — CARVEDILOL 3.125 MG PO TABS
3.1250 mg | ORAL_TABLET | Freq: Two times a day (BID) | ORAL | Status: DC
  Administered 2015-06-25 – 2015-06-27 (×4): 3.125 mg via ORAL
  Filled 2015-06-25 (×4): qty 1

## 2015-06-25 MED ORDER — INSULIN GLARGINE 100 UNIT/ML ~~LOC~~ SOLN
20.0000 [IU] | Freq: Two times a day (BID) | SUBCUTANEOUS | Status: DC
  Administered 2015-06-25 – 2015-06-28 (×6): 20 [IU] via SUBCUTANEOUS
  Filled 2015-06-25 (×7): qty 0.2

## 2015-06-25 NOTE — H&P (Signed)
PROGRESS NOTE    Kenric [name].  PF:[id number] DOB: 09-20-1970 DOA: 06/23/2015 PCP: [facility]   Outpatient Specialists:    Brief Narrative: 45 y.o. male with medical history significant of hypertension, hyperlipidemia, diabetes mellitus, who presents with left facial lesion and left facial swelling/Cellulitis that did not respond to amoxicillin. The patient reports prior episode of facial cellulitis in January of this year. Patient is currently on treatment for facial cellulitis. Elevated BP noted. Will start the patient on Coreg 3.125mg  po bid  Assessment & Plan:   Principal Problem:   Facial cellulitis Active Problems:   Sepsis ([place])   Diabetes mellitus without complication (H[facility])   HLD (hyperlipidemia)   AKI (acute kidney injury) ([place])   Essential hypertension   Diarrhea   Cellulitis, face  Facial cellulitis and sepsis: Continue antibiotics. CT soft tissue of neck finding is noted. Culture wound. Improving. Less tender today (suspect MRSA) DM-II: Optimize. HLD  AKI, Mild, resolved significantly.   Essential hypertension: Optimize Diarrhea: Follow work up.  Elevated BP - Likely hypertension. Start coreg 3.125 po Bid.  DVT ppx: SQ Lovenox Code Status: Full code Family Communication: None at bed side. Disposition Plan: Anticipate discharge back to previous home environment Consults called: none  Antimicrobials: Vanc and Zosyn  Subjective: Nil new complaints. Left facial tenderness on palpation. Tachycardia has resolved.  Objective: Filed Vitals:   06/24/15 2156 06/25/15 0543 06/25/15 0908 06/25/15 1350  BP: 146/83 171/117 172/99 169/101  Pulse: 87 76 79 78  Temp: 98.3 F (36.8 C) 98 F (36.7 C) 97.9 F (36.6 C) 98 F (36.7 C)  TempSrc: Oral Oral Oral Oral  Resp: 16 16 20 18   Height:      Weight: 107.5 kg (236 lb 15.9 oz)     SpO2: 98% 100% 97% 100%    Intake/Output Summary (Last 24 hours) at 06/25/15 1516 Last data filed at  06/25/15 1351  Gross per 24 hour  Intake 3973.33 ml  Output      0 ml  Net 3973.33 ml   Filed Weights   06/23/15 1716 06/23/15 2323 06/24/15 2156  Weight: 103.42 kg (228 lb) 107.4 kg (236 lb 12.4 oz) 107.5 kg (236 lb 15.9 oz)    Examination:  General exam: Appears calm and comfortable  HEENT - Severe tenderness on palpation of left side of face, with some papular lesions Respiratory system: Clear to auscultation. Respiratory effort normal. Cardiovascular system: S1 & S2, tachycardia. Gastrointestinal system: Abdomen is nondistended  Central nervous system: Alert and oriented. No focal neurological deficits. Extremities: Symmetric 5 x 5 power.   Data Reviewed: I have personally reviewed following labs and imaging studies  CBC:  Recent Labs Lab 06/23/15 1810 06/24/15 0508  WBC 7.7 7.5  NEUTROABS 4.9  --   HGB 12.3* 11.3*  HCT 39.8 36.8*  MCV 71.8* 71.9*  PLT 266 [id number]   Basic Metabolic Panel:  Recent Labs Lab 06/23/15 1810 06/24/15 0508  NA 135 137  K 4.5 3.9  CL 99* 105  CO2 25 25  GLUCOSE 517* 315*  BUN 16 13  CREATININE 1.33* 1.01  CALCIUM 9.0 8.4*   GFR: Estimated Creatinine Clearance: 119.8 mL/min (by C-G formula based on Cr of 1.01). Liver Function Tests: No results for input(s): AST, ALT, ALKPHOS, BILITOT, PROT, ALBUMIN in the last 168 hours. No results for input(s): LIPASE, AMYLASE in the last 168 hours. No results for input(s): AMMONIA in the last 168 hours. Coagulation Profile: No results for  input(s): INR, PROTIME in the last 168 hours. Cardiac Enzymes: No results for input(s): CKTOTAL, CKMB, CKMBINDEX, TROPONINI in the last 168 hours. BNP (last 3 results) No results for input(s): PROBNP in the last 8760 hours. HbA1C: No results for input(s): HGBA1C in the last 72 hours. CBG:  Recent Labs Lab 06/24/15 1212 06/24/15 1819 06/24/15 2152 06/25/15 0814 06/25/15 1159  GLUCAP 264* 218* 284* 221* 204*   Lipid Profile:  Recent Labs   06/24/15 0508  CHOL 100  HDL 38*  LDLCALC 43  TRIG 94  CHOLHDL 2.6   Thyroid Function Tests: No results for input(s): TSH, T4TOTAL, FREET4, T3FREE, THYROIDAB in the last 72 hours. Anemia Panel: No results for input(s): VITAMINB12, FOLATE, FERRITIN, TIBC, IRON, RETICCTPCT in the last 72 hours. Urine analysis: No results found for: COLORURINE, APPEARANCEUR, LABSPEC, PHURINE, GLUCOSEU, HGBUR, BILIRUBINUR, KETONESUR, PROTEINUR, UROBILINOGEN, NITRITE, LEUKOCYTESUR Sepsis Labs: @LABRCNTIP (procalcitonin:4,lacticidven:4)  ) Recent Results (from the past 240 hour(s))  Culture, blood (routine x 2)     Status: None (Preliminary result)   Collection Time: 06/23/15  7:39 PM  Result Value Ref Range Status   Specimen Description BLOOD RIGHT ANTECUBITAL  Final   Special Requests BOTTLES DRAWN AEROBIC AND ANAEROBIC 5CC  Final   Culture NO GROWTH < 24 HOURS  Final   Report Status PENDING  Incomplete  Culture, blood (routine x 2)     Status: None (Preliminary result)   Collection Time: 06/23/15  7:44 PM  Result Value Ref Range Status   Specimen Description BLOOD RIGHT HAND  Final   Special Requests BOTTLES DRAWN AEROBIC AND ANAEROBIC 5CC  Final   Culture NO GROWTH < 24 HOURS  Final   Report Status PENDING  Incomplete  Wound culture     Status: None (Preliminary result)   Collection Time: 06/24/15  2:48 PM  Result Value Ref Range Status   Specimen Description WOUND  Final   Special Requests LEFT JAW  Final   Gram Stain   Final    MODERATE WBC PRESENT,BOTH PMN AND MONONUCLEAR NO SQUAMOUS EPITHELIAL CELLS SEEN MODERATE GRAM POSITIVE COCCI IN PAIRS IN CLUSTERS Performed at [facility]    Culture PENDING  Incomplete   Report Status PENDING  Incomplete         Radiology Studies: Ct Soft Tissue Neck W Contrast  06/23/2015  CLINICAL DATA:  45 year old male with left facial cellulitis with soft tissue swelling from the temporal region down through the neck for 4 days. Initial  encounter. EXAM: CT NECK WITH CONTRAST TECHNIQUE: Multidetector CT imaging of the neck was performed using the standard protocol following the bolus administration of intravenous contrast. CONTRAST:  75 mL Isovue-300 COMPARISON:  Face CT 03/06/2015.  Head CT 05/20/2014. FINDINGS: Pharynx and larynx: Negative larynx. Pharyngeal soft tissue contours are within normal limits. Stable and negative parapharyngeal and retropharyngeal spaces. Salivary glands: Negative sublingual space. Submandibular glands and parotid glands appear stable and within normal limits. Thyroid: Negative. Lymph nodes: Bilateral cervical lymph nodes are largely stable since January. Level 1A nodes (series 2 1 image 89) appears slightly larger, but remain within normal limits. No cystic or necrotic nodes. There is new confluent soft tissue swelling and stranding along the left face at the level of the dentition/buccal space (series 21, image 67). The changes involve the skin and subcutaneous soft tissues. There is no associated fluid collection. No subcutaneous gas. The dentition here appears stable since January. As before periapical lucency is maximal at the anterior most mandibular dentition bilaterally.  Incidental chronic suboccipital scalp scarring is noted and unchanged. Vascular: Major vascular structures in the neck and at the skullbase appear patent, including the left IJ. Limited intracranial: Stable and negative. Visualized orbits: Orbits soft tissues remain normal. Mastoids and visualized paranasal sinuses: Trace paranasal sinus mucosal thickening is stable. Mastoids and tympanic cavities remain clear. Skeleton: Dentition appears stable and is described above. Chronic enlargement of the left posterior hyoid bone incidentally re- demonstrated. Chronic cervical spine disc and endplate degeneration. No new osseous abnormality. Upper chest: Negative lung apices. No superior mediastinal lymphadenopathy. IMPRESSION: 1. Acute left facial soft  tissue swelling and stranding compatible with cellulitis, maximal at the level of the left buccal space. No associated fluid collection or pathologic lymph nodes at this time. 2. Underlying dentition appears stable since January, and is somewhat poor along the anterior mandible. Electronically Signed   By: Genevie [name] M.D.   On: 06/23/2015 21:00        Scheduled Meds: . carvedilol  3.125 mg Oral BID WC  . enoxaparin (LOVENOX) injection  40 mg Subcutaneous Daily  . insulin aspart  0-9 Units Subcutaneous TID WC  . insulin glargine  20 Units Subcutaneous BID  . piperacillin-tazobactam (ZOSYN)  IV  3.375 g Intravenous Q8H  . rosuvastatin  40 mg Oral q morning - 10a  . sodium chloride flush  3 mL Intravenous Q12H  . tamsulosin  0.4 mg Oral q morning - 10a  . vancomycin  750 mg Intravenous Q12H   Continuous Infusions: . sodium chloride 100 mL/hr at 06/24/15 1657     LOS: 1 day    Time spent: 17 Mins    Bonnell Public, MD [facility] Pager [phone]  If 7PM-7AM, please contact night-coverage [email or web address] Password [name] 06/25/2015, 3:16 PM

## 2015-06-26 DIAGNOSIS — L0201 Cutaneous abscess of face: Secondary | ICD-10-CM

## 2015-06-26 LAB — GLUCOSE, CAPILLARY
GLUCOSE-CAPILLARY: 211 mg/dL — AB (ref 65–99)
GLUCOSE-CAPILLARY: 265 mg/dL — AB (ref 65–99)
GLUCOSE-CAPILLARY: 195 mg/dL — AB (ref 65–99)
Glucose-Capillary: 232 mg/dL — ABNORMAL HIGH (ref 65–99)

## 2015-06-26 LAB — VANCOMYCIN, TROUGH: Vancomycin Tr: 5 ug/mL — ABNORMAL LOW (ref 10.0–20.0)

## 2015-06-26 MED ORDER — VANCOMYCIN HCL IN DEXTROSE 1-5 GM/200ML-% IV SOLN
1000.0000 mg | Freq: Three times a day (TID) | INTRAVENOUS | Status: DC
  Administered 2015-06-26 – 2015-06-28 (×6): 1000 mg via INTRAVENOUS
  Filled 2015-06-26 (×8): qty 200

## 2015-06-26 NOTE — Progress Notes (Signed)
PROGRESS NOTE    [name].  RV:[id number] DOB: [birth year] DOA: 06/23/2015 PCP: [facility]   Outpatient Specialists:    Brief Narrative:  45 y.o. male with medical history significant of hypertension, hyperlipidemia, diabetes mellitus, who presents with left facial lesion and left facial swelling/Cellulitis that did not respond to amoxicillin. The patient reports prior episode of facial cellulitis in January of this year. Patient is currently on treatment for facial cellulitis. Elevated BP noted. Will start the patient on Coreg 3.125mg  po bid.   Assessment & Plan:  Principal Problem:  Left facial cellulitis and abscess Active Problems:  Sepsis (HCC)/SIRS  Diabetes mellitus without complication (HCC)  HLD (hyperlipidemia)  AKI (acute kidney injury) ([place])  Essential hypertension  Diarrhea  Cellulitis, face  Facial cellulitis and sepsis: Continue antibiotics, but will discontinue IV Zosyn. Follow cultures. Discussed with General Surgery and they advised consulting ENT (Dr. Mickie Hillier office called with details). DM-II: Optimize. HLD  AKI, Mild, resolved significantly.  Essential hypertension: Optimize Diarrhea: Resolved.  Elevated BP - Likely hypertension. Start coreg 3.125 po Bid.  DVT ppx: SQ Lovenox Code Status: Full code Family Communication: None at bed side. Disposition Plan: Anticipate discharge back to previous home environment Consults called: none  Antimicrobials: Vanc  Subjective: Nil new complaints. Left facial tenderness on palpation. Tachycardia has resolved.  Objective: Filed Vitals:   06/25/15 1350 06/25/15 2111 06/26/15 0501 06/26/15 0803  BP: 169/101 145/79 144/81 169/104  Pulse: 78 81 74 79  Temp: 98 F (36.7 C) 98.4 F (36.9 C) 98.2 F (36.8 C) 98.2 F (36.8 C)  TempSrc: Oral Oral Oral Oral  Resp: 18 16 18 18   Height:      Weight:  107.4 kg (236 lb 12.4 oz)    SpO2: 100% 100% 99% 100%     Intake/Output Summary (Last 24 hours) at 06/26/15 1200 Last data filed at 06/26/15 0945  Gross per 24 hour  Intake 3715.01 ml  Output    501 ml  Net 3214.01 ml   Filed Weights   06/23/15 2323 06/24/15 2156 06/25/15 2111  Weight: 107.4 kg (236 lb 12.4 oz) 107.5 kg (236 lb 15.9 oz) 107.4 kg (236 lb 12.4 oz)    Examination: General exam: Appears calm and comfortable  HEENT - Area of induration and evolving abscess left face (lateral to the left side of the mouth). Facial tenderness has improved significantly. Respiratory system: Clear to auscultation. Respiratory effort normal. Cardiovascular system: S1 & S2, tachycardia. Gastrointestinal system: Abdomen is nondistended  Central nervous system: Alert and oriented. No focal neurological deficits. Extremities: Symmetric 5 x 5 power  Data Reviewed: I have personally reviewed following labs and imaging studies  CBC:  Recent Labs Lab 06/23/15 1810 06/24/15 0508  WBC 7.7 7.5  NEUTROABS 4.9  --   HGB 12.3* 11.3*  HCT 39.8 36.8*  MCV 71.8* 71.9*  PLT 266 [id number]   Basic Metabolic Panel:  Recent Labs Lab 06/23/15 1810 06/24/15 0508  NA 135 137  K 4.5 3.9  CL 99* 105  CO2 25 25  GLUCOSE 517* 315*  BUN 16 13  CREATININE 1.33* 1.01  CALCIUM 9.0 8.4*   GFR: Estimated Creatinine Clearance: 119.8 mL/min (by C-G formula based on Cr of 1.01). Liver Function Tests: No results for input(s): AST, ALT, ALKPHOS, BILITOT, PROT, ALBUMIN in the last 168 hours. No results for input(s): LIPASE, AMYLASE in the last 168 hours. No results for input(s): AMMONIA in the last 168 hours. Coagulation  Profile: No results for input(s): INR, PROTIME in the last 168 hours. Cardiac Enzymes: No results for input(s): CKTOTAL, CKMB, CKMBINDEX, TROPONINI in the last 168 hours. BNP (last 3 results) No results for input(s): PROBNP in the last 8760 hours. HbA1C: No results for input(s): HGBA1C in the last 72 hours. CBG:  Recent Labs Lab  06/25/15 0814 06/25/15 1159 06/25/15 1703 06/25/15 2107 06/26/15 0753  GLUCAP 221* 204* 195* 239* 211*   Lipid Profile:  Recent Labs  06/24/15 0508  CHOL 100  HDL 38*  LDLCALC 43  TRIG 94  CHOLHDL 2.6   Thyroid Function Tests: No results for input(s): TSH, T4TOTAL, FREET4, T3FREE, THYROIDAB in the last 72 hours. Anemia Panel: No results for input(s): VITAMINB12, FOLATE, FERRITIN, TIBC, IRON, RETICCTPCT in the last 72 hours. Urine analysis: No results found for: COLORURINE, APPEARANCEUR, LABSPEC, PHURINE, GLUCOSEU, HGBUR, BILIRUBINUR, KETONESUR, PROTEINUR, UROBILINOGEN, NITRITE, LEUKOCYTESUR Sepsis Labs: @LABRCNTIP (procalcitonin:4,lacticidven:4)  ) Recent Results (from the past 240 hour(s))  Culture, blood (routine x 2)     Status: None (Preliminary result)   Collection Time: 06/23/15  7:39 PM  Result Value Ref Range Status   Specimen Description BLOOD RIGHT ANTECUBITAL  Final   Special Requests BOTTLES DRAWN AEROBIC AND ANAEROBIC 5CC  Final   Culture NO GROWTH 2 DAYS  Final   Report Status PENDING  Incomplete  Culture, blood (routine x 2)     Status: None (Preliminary result)   Collection Time: 06/23/15  7:44 PM  Result Value Ref Range Status   Specimen Description BLOOD RIGHT HAND  Final   Special Requests BOTTLES DRAWN AEROBIC AND ANAEROBIC 5CC  Final   Culture NO GROWTH 2 DAYS  Final   Report Status PENDING  Incomplete  Wound culture     Status: None (Preliminary result)   Collection Time: 06/24/15  2:48 PM  Result Value Ref Range Status   Specimen Description WOUND  Final   Special Requests LEFT JAW  Final   Gram Stain   Final    MODERATE WBC PRESENT,BOTH PMN AND MONONUCLEAR NO SQUAMOUS EPITHELIAL CELLS SEEN MODERATE GRAM POSITIVE COCCI IN PAIRS IN CLUSTERS Performed at [facility]    Culture   Final    ABUNDANT STAPHYLOCOCCUS AUREUS Note: RIFAMPIN AND GENTAMICIN SHOULD NOT BE USED AS SINGLE DRUGS FOR TREATMENT OF STAPH INFECTIONS. Performed at  [facility]    Report Status PENDING  Incomplete         Radiology Studies: No results found.      Scheduled Meds: . carvedilol  3.125 mg Oral BID WC  . enoxaparin (LOVENOX) injection  40 mg Subcutaneous Daily  . insulin aspart  0-9 Units Subcutaneous TID WC  . insulin glargine  20 Units Subcutaneous BID  . rosuvastatin  40 mg Oral q morning - 10a  . sodium chloride flush  3 mL Intravenous Q12H  . tamsulosin  0.4 mg Oral q morning - 10a  . vancomycin  750 mg Intravenous Q12H   Continuous Infusions: . sodium chloride 100 mL/hr at 06/26/15 0821     LOS: 2 days    Time spent: 20 Mins    Bonnell Public, MD [facility] Pager [phone]  If 7PM-7AM, please contact night-coverage [email or web address] Password [name] 06/26/2015, 12:00 PM

## 2015-06-26 NOTE — Progress Notes (Signed)
Pharmacy Antibiotic Note  [name]. is a 45 y.o. male admitted on 06/23/2015 with cellulitis.  Pharmacy has been consulted for vancomycin  Dosing.  Vancomycin trough is 5 on 750mg  IV q12h. Dose not yet hung by [employer]. SCr is improving (1.33 >> 1.01). UOP not accurate.   Plan: Increase vancomycin 1000 mg IV every 8 hours for predicted trough ~12.  Follow-up renal function, culture results, and clinical status.   Height: 6\' 3"  (190.5 cm) Weight: 236 lb 12.4 oz (107.4 kg) IBW/kg (Calculated) : 84.5  Temp (24hrs), Avg:98.2 F (36.8 C), Min:98 F (36.7 C), Max:98.4 F (36.9 C)   Recent Labs Lab 06/23/15 1810 06/23/15 1824 06/23/15 2108 06/24/15 0508 06/26/15 2040  WBC 7.7  --   --  7.5  --   CREATININE 1.33*  --   --  1.01  --   LATICACIDVEN  --  3.06* 0.91  --   --   VANCOTROUGH  --   --   --   --  5*    Estimated Creatinine Clearance: 119.8 mL/min (by C-G formula based on Cr of 1.01).    Allergies  Allergen Reactions  . Levaquin [Levofloxacin] Swelling  . Phenergan [Promethazine Hcl] Diarrhea    Antimicrobials this admission: Zosyn 5/1>>5/4 Vancomycin 5/1>>  Dose adjustments this admission: VT 5 - changed 750 q12 >> 1g q8   Microbiology results: 5/1 Blood: ngtd Cdiff and GI panel ordered 5/2 wound cx: staph aureus  Thank you for allowing pharmacy to be a part of this patient's care.  Brain Hilts 06/26/2015 9:45 PM

## 2015-06-27 LAB — WOUND CULTURE

## 2015-06-27 LAB — GLUCOSE, CAPILLARY
GLUCOSE-CAPILLARY: 171 mg/dL — AB (ref 65–99)
Glucose-Capillary: 224 mg/dL — ABNORMAL HIGH (ref 65–99)
Glucose-Capillary: 258 mg/dL — ABNORMAL HIGH (ref 65–99)
Glucose-Capillary: 213 mg/dL — ABNORMAL HIGH (ref 65–99)

## 2015-06-27 LAB — MRSA PCR SCREENING: MRSA by PCR: POSITIVE — AB

## 2015-06-27 MED ORDER — MUPIROCIN 2 % EX OINT
1.0000 "application " | TOPICAL_OINTMENT | Freq: Two times a day (BID) | CUTANEOUS | Status: DC
  Administered 2015-06-27 – 2015-06-28 (×2): 1 via NASAL
  Filled 2015-06-27: qty 22

## 2015-06-27 MED ORDER — LIDOCAINE-EPINEPHRINE 1 %-1:100000 IJ SOLN
20.0000 mL | Freq: Once | INTRAMUSCULAR | Status: DC
  Filled 2015-06-27: qty 20

## 2015-06-27 MED ORDER — CHLORHEXIDINE GLUCONATE CLOTH 2 % EX PADS
6.0000 | MEDICATED_PAD | Freq: Every day | CUTANEOUS | Status: DC
  Administered 2015-06-28: 6 via TOPICAL

## 2015-06-27 MED ORDER — CARVEDILOL 6.25 MG PO TABS
6.2500 mg | ORAL_TABLET | Freq: Two times a day (BID) | ORAL | Status: DC
  Administered 2015-06-27 – 2015-06-28 (×2): 6.25 mg via ORAL
  Filled 2015-06-27 (×2): qty 1

## 2015-06-27 NOTE — Progress Notes (Addendum)
Call received from [facility] shortly ago, pt's wound culture +MRSA, orders placed per protocol for orange contact. Will notify pt and explain contact precautions to pt, contact protection equipment being placed at pt's door for staff and visitors.

## 2015-06-27 NOTE — Consult Note (Signed)
Reason for Consult:left facial cellulitis, ? abscess Referring Physician: [facility] hospital[facility]  [name]. is an 45 y.o. male.  HPI: Patient presented via the ER on 5/1 with pain and left facial swelling. CT scan demonstrated left facial cellulitis but no definite abscess. He had some purulent drainage in his beard that was positive for MRSA. He's been on vancomycin and the swelling and pain has been improving especially over the last 48 hrs. I was consulted to see if I&D is indicated.    Past Medical History  Diagnosis Date  . Diabetes mellitus without complication ([place])   . Hypertension   . HLD (hyperlipidemia)     Past Surgical History  Procedure Laterality Date  . Arthroscopic repair acl    . Tonsilectomy/adenoidectomy with myringotomy    . Prostate surgery  2016    Social History:  reports that he has never smoked. He does not have any smokeless tobacco history on file. He reports that he does not drink alcohol or use illicit drugs.  Allergies:  Allergies  Allergen Reactions  . Levaquin [Levofloxacin] Swelling  . Phenergan [Promethazine Hcl] Diarrhea    Medications: I have reviewed the patient's current medications.  Results for orders placed or performed during the hospital encounter of 06/23/15 (from the past 48 hour(s))  Glucose, capillary     Status: Abnormal   Collection Time: 06/25/15  9:07 PM  Result Value Ref Range   Glucose-Capillary 239 (H) 65 - 99 mg/dL  Glucose, capillary     Status: Abnormal   Collection Time: 06/26/15  7:53 AM  Result Value Ref Range   Glucose-Capillary 211 (H) 65 - 99 mg/dL  Glucose, capillary     Status: Abnormal   Collection Time: 06/26/15 11:51 AM  Result Value Ref Range   Glucose-Capillary 232 (H) 65 - 99 mg/dL  Glucose, capillary     Status: Abnormal   Collection Time: 06/26/15  4:13 PM  Result Value Ref Range   Glucose-Capillary 265 (H) 65 - 99 mg/dL   Comment 1 Notify RN    Comment 2 Document in Chart   Glucose,  capillary     Status: Abnormal   Collection Time: 06/26/15  8:25 PM  Result Value Ref Range   Glucose-Capillary 195 (H) 65 - 99 mg/dL   Comment 1 Notify RN    Comment 2 Document in Chart   Vancomycin, trough     Status: Abnormal   Collection Time: 06/26/15  8:40 PM  Result Value Ref Range   Vancomycin Tr 5 (L) 10.0 - 20.0 ug/mL  Glucose, capillary     Status: Abnormal   Collection Time: 06/27/15  8:28 AM  Result Value Ref Range   Glucose-Capillary 224 (H) 65 - 99 mg/dL  Glucose, capillary     Status: Abnormal   Collection Time: 06/27/15 11:43 AM  Result Value Ref Range   Glucose-Capillary 258 (H) 65 - 99 mg/dL  MRSA PCR Screening     Status: Abnormal   Collection Time: 06/27/15  3:24 PM  Result Value Ref Range   MRSA by PCR POSITIVE (A) NEGATIVE    Comment:        The GeneXpert MRSA Assay (FDA approved for NASAL specimens only), is one component of a comprehensive MRSA colonization surveillance program. It is not intended to diagnose MRSA infection nor to guide or monitor treatment for MRSA infections. RESULT CALLED TO, READ BACK BY AND VERIFIED WITH: T. PENNINGTON RN 17:25 06/27/15 ([email or web address])   Glucose, capillary  Status: Abnormal   Collection Time: 06/27/15  5:19 PM  Result Value Ref Range   Glucose-Capillary 171 (H) 65 - 99 mg/dL    No results found.  [id number] of recent infection on nose   [id number] alert NAD Has beard with dried crusting just to the left of the corner of his mouth On palpation of this area it's indurated for 2-3 cm but no real fluctuance or definite abscess to drain Nose is clear No extension to the neck  Assessment/Plan: Left facial MRSA cellulitis No definitive abscess to drain Improving on Vanc  Can discharge on Doxycyline 100 mg BID for 10 days. Patient can follow up with me next week if symptoms worsen Will recheck patient in am prior to [place] 06/27/2015, 6:16 PM

## 2015-06-27 NOTE — Progress Notes (Signed)
PROGRESS NOTE    [name].  [id number] DOB: 04/16/1970 DOA: 06/23/2015 PCP: [facility]   Outpatient Specialists:    Brief Narrative:  45 y.o. male with medical history significant of hypertension, hyperlipidemia, diabetes mellitus, who presents with left facial lesion and left facial swelling/Cellulitis that did not respond to amoxicillin. The patient reports prior episode of facial cellulitis in January of this year. Patient is currently on treatment for facial cellulitis. Wound culture has grown MRSA. Patient has abscess left face, lateral to left side of the mouth. Awaiting I and D. Likely DC on oral doxycycline for 10 days (discussed case with ID consultant). Will also swab nose for MRSA. Elevated BP noted. Will increase coreg to 6.25mg  po bid.   Assessment & Plan:  Principal Problem:  Left facial cellulitis and abscess Active Problems:  Sepsis (HCC)/SIRS  Diabetes mellitus without complication (HCC)  HLD (hyperlipidemia)  AKI (acute kidney injury) ([place])  Essential hypertension  Diarrhea  Cellulitis, face  Facial cellulitis and sepsis: Continue vancomycin. Likely DC home on oral doxycycline for 10 days. Swab nose for MRSA. Awaiting I and D by the ENT. DM-II: Optimize. HLD  AKI, Mild, resolved significantly.  Essential hypertension: Optimize Diarrhea: Resolved.  Hypertension - Increase Coreg to 6.25mg  po bid.  DVT ppx: SQ Lovenox Code Status: Full code Family Communication: None at bed side. Disposition Plan: Anticipate discharge back to previous home environment Consults called: none  Antimicrobials: Vanc  Subjective: Nil new complaints. Left facial tenderness on palpation. Tachycardia has resolved.  Objective: Filed Vitals:   06/26/15 1419 06/26/15 1759 06/26/15 2207 06/27/15 0930  BP: 145/95 151/86 171/93 173/98  Pulse: 79 75 84 78  Temp: 98 F (36.7 C) 98.4 F (36.9 C) 98.7 F (37.1 C) 98.2 F (36.8 C)    TempSrc: Oral Oral Oral Oral  Resp:   18 18  Height:      Weight:      SpO2: 100% 100% 97% 100%    Intake/Output Summary (Last 24 hours) at 06/27/15 1515 Last data filed at 06/27/15 0900  Gross per 24 hour  Intake    480 ml  Output      0 ml  Net    480 ml   Filed Weights   06/23/15 2323 06/24/15 2156 06/25/15 2111  Weight: 107.4 kg (236 lb 12.4 oz) 107.5 kg (236 lb 15.9 oz) 107.4 kg (236 lb 12.4 oz)    Examination: General exam: Appears calm and comfortable  HEENT - Area of induration and evolving abscess left face (lateral to the left side of the mouth). Facial tenderness has improved significantly. Respiratory system: Clear to auscultation. Respiratory effort normal. Cardiovascular system: S1 & S2, tachycardia. Gastrointestinal system: Abdomen is nondistended  Central nervous system: Alert and oriented. No focal neurological deficits. Extremities: Symmetric 5 x 5 power  Data Reviewed: I have personally reviewed following labs and imaging studies  CBC:  Recent Labs Lab 06/23/15 1810 06/24/15 0508  WBC 7.7 7.5  NEUTROABS 4.9  --   HGB 12.3* 11.3*  HCT 39.8 36.8*  MCV 71.8* 71.9*  PLT 266 [id number]   Basic Metabolic Panel:  Recent Labs Lab 06/23/15 1810 06/24/15 0508  NA 135 137  K 4.5 3.9  CL 99* 105  CO2 25 25  GLUCOSE 517* 315*  BUN 16 13  CREATININE 1.33* 1.01  CALCIUM 9.0 8.4*   GFR: Estimated Creatinine Clearance: 119.8 mL/min (by C-G formula based on Cr of 1.01). Liver Function Tests:  No results for input(s): AST, ALT, ALKPHOS, BILITOT, PROT, ALBUMIN in the last 168 hours. No results for input(s): LIPASE, AMYLASE in the last 168 hours. No results for input(s): AMMONIA in the last 168 hours. Coagulation Profile: No results for input(s): INR, PROTIME in the last 168 hours. Cardiac Enzymes: No results for input(s): CKTOTAL, CKMB, CKMBINDEX, TROPONINI in the last 168 hours. BNP (last 3 results) No results for input(s): PROBNP in the last 8760  hours. HbA1C: No results for input(s): HGBA1C in the last 72 hours. CBG:  Recent Labs Lab 06/26/15 1151 06/26/15 1613 06/26/15 2025 06/27/15 0828 06/27/15 1143  GLUCAP 232* 265* 195* 224* 258*   Lipid Profile: No results for input(s): CHOL, HDL, LDLCALC, TRIG, CHOLHDL, LDLDIRECT in the last 72 hours. Thyroid Function Tests: No results for input(s): TSH, T4TOTAL, FREET4, T3FREE, THYROIDAB in the last 72 hours. Anemia Panel: No results for input(s): VITAMINB12, FOLATE, FERRITIN, TIBC, IRON, RETICCTPCT in the last 72 hours. Urine analysis: No results found for: COLORURINE, APPEARANCEUR, LABSPEC, PHURINE, GLUCOSEU, HGBUR, BILIRUBINUR, KETONESUR, PROTEINUR, UROBILINOGEN, NITRITE, LEUKOCYTESUR Sepsis Labs: @LABRCNTIP (procalcitonin:4,lacticidven:4)  ) Recent Results (from the past 240 hour(s))  Culture, blood (routine x 2)     Status: None (Preliminary result)   Collection Time: 06/23/15  7:39 PM  Result Value Ref Range Status   Specimen Description BLOOD RIGHT ANTECUBITAL  Final   Special Requests BOTTLES DRAWN AEROBIC AND ANAEROBIC 5CC  Final   Culture NO GROWTH 3 DAYS  Final   Report Status PENDING  Incomplete  Culture, blood (routine x 2)     Status: None (Preliminary result)   Collection Time: 06/23/15  7:44 PM  Result Value Ref Range Status   Specimen Description BLOOD RIGHT HAND  Final   Special Requests BOTTLES DRAWN AEROBIC AND ANAEROBIC 5CC  Final   Culture NO GROWTH 3 DAYS  Final   Report Status PENDING  Incomplete  Wound culture     Status: None   Collection Time: 06/24/15  2:48 PM  Result Value Ref Range Status   Specimen Description WOUND  Final   Special Requests LEFT JAW  Final   Gram Stain   Final    MODERATE WBC PRESENT,BOTH PMN AND MONONUCLEAR NO SQUAMOUS EPITHELIAL CELLS SEEN MODERATE GRAM POSITIVE COCCI IN PAIRS IN CLUSTERS Performed at [facility]    Culture   Final    ABUNDANT METHICILLIN RESISTANT STAPHYLOCOCCUS AUREUS 5 Note: RIFAMPIN  AND GENTAMICIN SHOULD NOT BE USED AS SINGLE DRUGS FOR TREATMENT OF STAPH INFECTIONS. This organism DOES NOT demonstrate inducible Clindamycin resistance in vitro. CRITICAL RESULT CALLED TO, READ BACK BY AND VERIFIED WITH: TAMMY 6E 5  17@1140  BY PARDA Performed at [facility]    Report Status 06/27/2015 FINAL  Final   Organism ID, Bacteria METHICILLIN RESISTANT STAPHYLOCOCCUS AUREUS  Final      Susceptibility   Methicillin resistant staphylococcus aureus - MIC*    CLINDAMYCIN <=0.25 SENSITIVE Sensitive     ERYTHROMYCIN >=8 RESISTANT Resistant     GENTAMICIN <=0.5 SENSITIVE Sensitive     LEVOFLOXACIN 0.25 SENSITIVE Sensitive     OXACILLIN >=4 RESISTANT Resistant     RIFAMPIN <=0.5 SENSITIVE Sensitive     TRIMETH/SULFA <=10 SENSITIVE Sensitive     VANCOMYCIN 1 SENSITIVE Sensitive     TETRACYCLINE <=1 SENSITIVE Sensitive     * ABUNDANT METHICILLIN RESISTANT STAPHYLOCOCCUS AUREUS         Radiology Studies: No results found.      Scheduled Meds: . carvedilol  6.25 mg Oral BID WC  . enoxaparin (LOVENOX) injection  40 mg Subcutaneous Daily  . insulin aspart  0-9 Units Subcutaneous TID WC  . insulin glargine  20 Units Subcutaneous BID  . rosuvastatin  40 mg Oral q morning - 10a  . sodium chloride flush  3 mL Intravenous Q12H  . tamsulosin  0.4 mg Oral q morning - 10a  . vancomycin  1,000 mg Intravenous Q8H   Continuous Infusions: . sodium chloride 100 mL/hr at 06/26/15 1406     LOS: 3 days    Time spent: 20 Mins    Bonnell Public, MD [facility] Pager [phone]  If 7PM-7AM, please contact night-coverage [email or web address] Password [name] 06/27/2015, 3:15 PM

## 2015-06-27 NOTE — Progress Notes (Signed)
Dr Marthenia Rolling wanted pt nares to be swabbed for MRSA, done, and results are positive. Pt already on contact precautions for MRSA in wound. Protocol orders placed. Pt informed.

## 2015-06-27 NOTE — Progress Notes (Signed)
Results for SOSUKE, CORTRIGHT (MRN [id number]) as of 06/27/2015 13:22  Ref. Range 06/26/2015 11:51 06/26/2015 16:13 06/26/2015 20:25 06/27/2015 08:28 06/27/2015 11:43  Glucose-Capillary Latest Ref Range: 65-99 mg/dL 232 (H) 265 (H) 195 (H) 224 (H) 258 (H)  CBGs continue to be greater than 180 mg/dl.  Recommend increasing Lantus to 24 units BID if CBGs continue to be elevated.  Harvel Ricks RN BSN CDE

## 2015-06-28 LAB — BASIC METABOLIC PANEL
Anion gap: 10 (ref 5–15)
BUN: 5 mg/dL — ABNORMAL LOW (ref 6–20)
CO2: 25 mmol/L (ref 22–32)
Calcium: 8.6 mg/dL — ABNORMAL LOW (ref 8.9–10.3)
Chloride: 104 mmol/L (ref 101–111)
Creatinine, Ser: 0.88 mg/dL (ref 0.61–1.24)
GFR calc Af Amer: >60 mL/min (ref >60)
GFR calc non Af Amer: >60 mL/min (ref >60)
Glucose, Bld: 149 mg/dL — ABNORMAL HIGH (ref 65–99)
Potassium: 3.5 mmol/L (ref 3.5–5.1)
Sodium: 139 mmol/L (ref 135–145)

## 2015-06-28 LAB — GLUCOSE, CAPILLARY
GLUCOSE-CAPILLARY: 141 mg/dL — AB (ref 65–99)
GLUCOSE-CAPILLARY: 165 mg/dL — AB (ref 65–99)

## 2015-06-28 LAB — CULTURE, BLOOD (ROUTINE X 2)
CULTURE: NO GROWTH
Culture: NO GROWTH

## 2015-06-28 MED ORDER — MUPIROCIN 2 % EX OINT: 1.0000 "application " | TOPICAL_OINTMENT | Freq: Two times a day (BID) | CUTANEOUS | Status: AC

## 2015-06-28 MED ORDER — LISINOPRIL 10 MG PO TABS: 10.0000 mg | ORAL_TABLET | Freq: Every day | ORAL | Status: DC

## 2015-06-28 MED ORDER — DOXYCYCLINE HYCLATE 50 MG PO CAPS: 100.0000 mg | ORAL_CAPSULE | Freq: Two times a day (BID) | ORAL | Status: AC

## 2015-06-28 MED ORDER — CHLORHEXIDINE GLUCONATE CLOTH 2 % EX PADS: 6.0000 | MEDICATED_PAD | Freq: Every day | CUTANEOUS | Status: AC

## 2015-06-28 MED ORDER — CARVEDILOL 6.25 MG PO TABS: 6.2500 mg | ORAL_TABLET | Freq: Two times a day (BID) | ORAL | Status: DC

## 2015-06-28 MED ORDER — INSULIN GLARGINE 100 UNIT/ML ~~LOC~~ SOLN: 20.0000 [IU] | Freq: Two times a day (BID) | SUBCUTANEOUS | Status: DC

## 2015-06-28 NOTE — Progress Notes (Signed)
Patient discharge teaching given, including activity, diet, follow-up appoints, and medications. Patient verbalized understanding of all discharge instructions. IV access was d/c'd. Vitals are stable. Skin is intact except as charted in most recent assessments. Pt to be escorted out by NT, to be driven home by family.  Carleen Rhue, MBA, BSN, RN 

## 2015-06-28 NOTE — Progress Notes (Signed)
Left facial cellulitis continues to improve on clinical exam today. Should do well on po antibiotics for the next week. Can follow up in my office if any problems. OK to shave beard.

## 2015-06-28 NOTE — Discharge Summary (Signed)
Physician Discharge Summary  Patient ID: [name]. MRN: [id number] DOB/AGE: 11-13-1970 45 y.o.  Admit date: 06/23/2015 Discharge date: 06/28/2015  Admission Diagnoses:  Discharge Diagnoses:  Principal Problem:   Facial cellulitis Active Problems:   Sepsis ([place])   Diabetes mellitus without complication ([place])   HLD (hyperlipidemia)   AKI (acute kidney injury) ([place])   Essential hypertension   Diarrhea   Cellulitis, face   Discharged Condition: stable  Hospital Course: 45 y.o. male with medical history significant of hypertension, hyperlipidemia, diabetes mellitus, who presents with left facial lesion and left facial swelling/Cellulitis that did not respond to amoxicillin. The patient reports prior episode of facial cellulitis in January of this year. Wound culture has grown MRSA. ENT was consulted for possibly associated abscess but no I and D was advised. Patient was initially managed with IV Vancomycin and Zosyn during the hospital stay, but the Zosyn was eventually discontinued. Patient will be discharged back home on oral Doxycycline 100mg  po Bid for 10 days. Nasal swab was also positive for MRSA. Patient will be discharged back home Mupirocin nasal ointment and Chlorhexidine bath. Blood pressure was noted to be elevated during the hospital stay.Patient has abscess left face, lateral to left side of the mouth. Awaiting I and D. Likely DC on oral doxycycline for 10 days (discussed case with ID consultant). Will also swab nose for MRSA. Elevated BP noted. Will increase coreg to 6.25mg  po bid and lisinopril 10mg  po once daily. The PCP will kindly adjust medication as deemed necessary. Patient will be discharged back home to the care of PCP. Patient will also follow up with ENT surgeon on discharge.  Consults: ENT  Significant Diagnostic Studies: Imaging and cultures  Discharge Medication - Please see the Med Rec.  Discharge Exam: Blood pressure 176/101, pulse 75, temperature 98.4  F (36.9 C), temperature source Oral, resp. rate 18, height 6\' 3"  (1.905 m), weight 106.8 kg (235 lb 7.2 oz), SpO2 100 %.  Disposition: 01-Home or Self Care  Discharge Instructions    Diet - low sodium heart healthy    Complete by:  As directed      Diet Carb Modified    Complete by:  As directed      Discharge instructions    Complete by:  As directed   Call MD with worsening symptoms     Increase activity slowly    Complete by:  As directed             Medication List    STOP taking these medications        ibuprofen 600 MG tablet  Commonly known as:  ADVIL,MOTRIN     lisinopril-hydrochlorothiazide 20-25 MG tablet  Commonly known as:  PRINZIDE,ZESTORETIC     sodium chloride 0.65 % Soln nasal spray  Commonly known as:  OCEAN     sulfamethoxazole-trimethoprim 800-160 MG tablet  Commonly known as:  BACTRIM DS,SEPTRA DS      TAKE these medications        carvedilol 6.25 MG tablet  Commonly known as:  COREG  Take 1 tablet (6.25 mg total) by mouth 2 (two) times daily with a meal.     Chlorhexidine Gluconate Cloth 2 % Pads  Apply 6 each topically daily at 6 (six) AM.     doxycycline 50 MG capsule  Commonly known as:  VIBRAMYCIN  Take 2 capsules (100 mg total) by mouth 2 (two) times daily.     insulin aspart 100 UNIT/ML injection  Commonly known  as:  NOVOLOG  Inject 4 Units into the skin 3 (three) times daily before meals.     insulin glargine 100 UNIT/ML injection  Commonly known as:  LANTUS  Inject 0.2 mLs (20 Units total) into the skin 2 (two) times daily.     metFORMIN 1000 MG tablet  Commonly known as:  GLUCOPHAGE  Take 1,000 mg by mouth 2 (two) times daily.     mupirocin ointment 2 %  Commonly known as:  BACTROBAN  Place 1 application into the nose 2 (two) times daily.     rosuvastatin 40 MG tablet  Commonly known as:  CRESTOR  Take 40 mg by mouth every morning.     tamsulosin 0.4 MG Caps capsule  Commonly known as:  FLOMAX  Take 0.4 mg by mouth  every morning.           Follow-up Information    Follow up with [facility] In 1 week.   Why:  Follow up with ENT   Contact information:   [address] [place] [address] [id number]       Signed: Bonnell Public 06/28/2015, 12:48 PM

## 2015-10-24 ENCOUNTER — Encounter: Payer: Self-pay | Admitting: *Deleted

## 2015-10-28 ENCOUNTER — Ambulatory Visit: Payer: BC Managed Care – PPO | Admitting: Anesthesiology

## 2015-10-28 ENCOUNTER — Ambulatory Visit
Admission: RE | Admit: 2015-10-28 | Discharge: 2015-10-28 | Disposition: A | Discharge status: Home / Self Care | Payer: BC Managed Care – PPO | Source: Ambulatory Visit | Attending: Gastroenterology | Admitting: Gastroenterology

## 2015-10-28 ENCOUNTER — Encounter: Payer: Self-pay | Admitting: *Deleted

## 2015-10-28 ENCOUNTER — Encounter
Admission: RE | Disposition: A | Discharge status: Home / Self Care | Payer: Self-pay | Source: Ambulatory Visit | Attending: Gastroenterology

## 2015-10-28 DIAGNOSIS — I10 Essential (primary) hypertension: Secondary | ICD-10-CM | POA: Insufficient documentation

## 2015-10-28 DIAGNOSIS — R197 Diarrhea, unspecified: Secondary | ICD-10-CM | POA: Diagnosis present

## 2015-10-28 DIAGNOSIS — K529 Noninfective gastroenteritis and colitis, unspecified: Secondary | ICD-10-CM | POA: Insufficient documentation

## 2015-10-28 DIAGNOSIS — E785 Hyperlipidemia, unspecified: Secondary | ICD-10-CM | POA: Insufficient documentation

## 2015-10-28 DIAGNOSIS — E119 Type 2 diabetes mellitus without complications: Secondary | ICD-10-CM | POA: Diagnosis not present

## 2015-10-28 DIAGNOSIS — Z794 Long term (current) use of insulin: Secondary | ICD-10-CM | POA: Insufficient documentation

## 2015-10-28 HISTORY — DX: Cellulitis, unspecified: L03.90

## 2015-10-28 HISTORY — DX: Palpitations: R00.2

## 2015-10-28 HISTORY — PX: COLONOSCOPY WITH PROPOFOL: SHX5780

## 2015-10-28 LAB — GLUCOSE, CAPILLARY: GLUCOSE-CAPILLARY: 202 mg/dL — AB (ref 65–99)

## 2015-10-28 SURGERY — COLONOSCOPY WITH PROPOFOL
Anesthesia: General

## 2015-10-28 MED ORDER — MIDAZOLAM HCL 2 MG/2ML IJ SOLN
INTRAMUSCULAR | Status: DC | PRN
  Administered 2015-10-28: 1 mg via INTRAVENOUS

## 2015-10-28 MED ORDER — SODIUM CHLORIDE 0.9 % IV SOLN
INTRAVENOUS | Status: DC
Start: 2015-10-28 — End: 2015-10-28

## 2015-10-28 MED ORDER — FENTANYL CITRATE (PF) 100 MCG/2ML IJ SOLN
INTRAMUSCULAR | Status: DC | PRN
  Administered 2015-10-28: 50 ug via INTRAVENOUS

## 2015-10-28 MED ORDER — SODIUM CHLORIDE 0.9 % IV SOLN
INTRAVENOUS | Status: DC
  Administered 2015-10-28: 08:00:00 via INTRAVENOUS

## 2015-10-28 MED ORDER — PROPOFOL 500 MG/50ML IV EMUL
INTRAVENOUS | Status: DC | PRN
  Administered 2015-10-28: 120 ug/kg/min via INTRAVENOUS

## 2015-10-28 NOTE — Op Note (Signed)
[facility] Gastroenterology Patient Name: [name] Procedure Date: 10/28/2015 8:44 AM MRN: [id number] Account #: [id number] Date of Birth: 1970/04/18 Admit Type: Outpatient Age: 45 Room: [facility] ENDO ROOM 3 Gender: Male Note Status: Finalized Procedure:            Colonoscopy Indications:          Chronic diarrhea, Clinically significant diarrhea of                        unexplained origin Providers:            Lollie Sails, MD Referring MD:         No Local Md, MD (Referring MD) Medicines:            Monitored Anesthesia Care Complications:        No immediate complications. Procedure:            Pre-Anesthesia Assessment:                       - ASA Grade Assessment: III - A patient with severe                        systemic disease.                       After obtaining informed consent, the colonoscope was                        passed under direct vision. Throughout the procedure,                        the patient's blood pressure, pulse, and oxygen                        saturations were monitored continuously. The                        Colonoscope was introduced through the anus with the                        intention of advancing to the cecum. The scope was                        advanced to the sigmoid colon before the procedure was                        aborted. Medications were given. The colonoscopy was                        extremely difficult due to poor bowel prep. The patient                        tolerated the procedure well. The quality of the bowel                        preparation was poor. Findings:      Copious quantities of semi-liquid stool was found in the rectum and in       the sigmoid colon, precluding visualization.      The digital rectal exam was normal. Impression:           -  Preparation of the colon was poor.                       - Stool in the rectum and in the sigmoid colon.                       - No  specimens collected. Recommendation:       - repeat prep and reschedule.                       - Discharge patient to home. Procedure Code(s):    --- Professional ---                       [phone], 69, Colonoscopy, flexible; diagnostic, including                        collection of specimen(s) by brushing or washing, when                        performed (separate procedure) Diagnosis Code(s):    --- Professional ---                       K52.9, Noninfective gastroenteritis and colitis,                        unspecified                       R19.7, Diarrhea, unspecified CPT copyright 2016 American [employer]. All rights reserved. The codes documented in this report are preliminary and upon coder review may  be revised to meet current compliance requirements. Lollie Sails, MD 10/28/2015 9:31:20 AM This report has been signed electronically. Number of Addenda: 0 Note Initiated On: 10/28/2015 8:44 AM Total Procedure Duration: 0 hours 1 minute 1 second       [facility]

## 2015-10-28 NOTE — Anesthesia Postprocedure Evaluation (Signed)
Anesthesia Post Note  Patient: [name].  Procedure(s) Performed: Procedure(s) (LRB): COLONOSCOPY WITH PROPOFOL (N/A)  Patient location during evaluation: Endoscopy Anesthesia Type: General Level of consciousness: awake and alert and oriented Pain management: pain level controlled Vital Signs Assessment: post-procedure vital signs reviewed and stable Respiratory status: spontaneous breathing, nonlabored ventilation and respiratory function stable Cardiovascular status: blood pressure returned to baseline and stable Postop Assessment: no signs of nausea or vomiting Anesthetic complications: no    Last Vitals:  Vitals:   10/28/15 0801 10/28/15 0850  BP: (!) 146/95 106/63  Pulse: 84 76  Resp: 16 16  Temp: (!) 35.7 C (!) 35.8 C    Last Pain:  Vitals:   10/28/15 0850  TempSrc: Tympanic                 Shanoah Asbill

## 2015-10-28 NOTE — Anesthesia Procedure Notes (Signed)
Performed by: COOK-MARTIN, Tonilynn Bieker Pre-anesthesia Checklist: Patient identified, Emergency Drugs available, Suction available, Patient being monitored and Timeout performed Patient Re-evaluated:Patient Re-evaluated prior to inductionOxygen Delivery Method: Nasal cannula Preoxygenation: Pre-oxygenation with 100% oxygen Intubation Type: IV induction Placement Confirmation: positive ETCO2 and CO2 detector       

## 2015-10-28 NOTE — H&P (Signed)
Outpatient short stay form Pre-procedure 10/28/2015 8:43 AM Lollie Sails MD  Primary Physician: [facility] Dr. Abran Richard  Reason for visit:  Colonoscopy  History of present illness:  Patient is a 45 year old male with a history of chronic diarrhea. This been going on for at least 8-9 months. He has had cultures done, PCR testing which was negative for infectious. He is diabetic and is insulin-dependent. He also does take metformin. He has been trialed on some colestipol however although this slows the number of loose stools down does not form.    Current Facility-Administered Medications:  .  0.9 %  sodium chloride infusion, , Intravenous, Continuous, Lollie Sails, MD, Last Rate: 20 mL/hr at 10/28/15 [phone] .  0.9 %  sodium chloride infusion, , Intravenous, Continuous, Lollie Sails, MD  Prescriptions Prior to Admission  Medication Sig Dispense Refill Last Dose  . famotidine (PEPCID) 20 MG tablet Take 20 mg by mouth 2 (two) times daily.   Past Week at Unknown time  . lisinopril (PRINIVIL) 10 MG tablet Take 1 tablet (10 mg total) by mouth daily. 30 tablet 0 10/27/2015 at Unknown time  . lisinopril-hydrochlorothiazide (PRINZIDE,ZESTORETIC) 20-25 MG tablet Take 1 tablet by mouth daily.   Past Week at Unknown time  . metFORMIN (GLUCOPHAGE) 1000 MG tablet Take 1,000 mg by mouth 2 (two) times daily.  1 10/27/2015 at Unknown time  . mupirocin ointment (BACTROBAN) 2 % Place 1 application into the nose 2 (two) times daily.   10/27/2015 at Unknown time  . tamsulosin (FLOMAX) 0.4 MG CAPS capsule Take 0.4 mg by mouth every morning.   10/27/2015 at Unknown time  . carvedilol (COREG) 6.25 MG tablet Take 1 tablet (6.25 mg total) by mouth 2 (two) times daily with a meal. 60 tablet 0   . cholestyramine light (PREVALITE) 4 g packet Take 4 g by mouth daily.   Not Taking at Unknown time  . colestipol (COLESTID) 1 g tablet Take 1 g by mouth 2 (two) times daily.   Not Taking at Unknown  time  . insulin aspart (NOVOLOG) 100 UNIT/ML injection Inject 4 Units into the skin 3 (three) times daily before meals. (Patient not taking: Reported on 10/28/2015) 10 mL 11 Not Taking  . insulin glargine (LANTUS) 100 UNIT/ML injection Inject 0.2 mLs (20 Units total) into the skin 2 (two) times daily. 10 mL 11   . rosuvastatin (CRESTOR) 40 MG tablet Take 40 mg by mouth every morning.   Completed Course at Unknown time  . traZODone (DESYREL) 50 MG tablet Take 50 mg by mouth at bedtime.   Not Taking at Unknown time     Allergies  Allergen Reactions  . Levaquin [Levofloxacin] Swelling  . Phenergan [Promethazine Hcl] Diarrhea     Past Medical History:  Diagnosis Date  . Cellulitis   . Diabetes mellitus without complication ([place])   . Heart palpitations   . HLD (hyperlipidemia)   . Hypertension     Review of systems:      Physical Exam    Heart and lungs: Regular rate and rhythm without rub or gallop, lungs are bilaterally clear.    HEENT: Septic atraumatic eyes are anicteric    Other:     Pertinant exam for procedure: Soft nontender nondistended bowel sounds positive normoactive.    Planned proceedures: Colonoscopy and indicated procedures. I have discussed the risks benefits and complications of procedures to include not limited to bleeding, infection, perforation and the risk of sedation  and the patient wishes to proceed.    Lollie Sails, MD Gastroenterology 10/28/2015  8:43 AM

## 2015-10-28 NOTE — Transfer of Care (Signed)
Immediate Anesthesia Transfer of Care Note  Patient: [name].  Procedure(s) Performed: Procedure(s): COLONOSCOPY WITH PROPOFOL (N/A)  Patient Location: PACU  Anesthesia Type:General  Level of Consciousness: awake, alert  and sedated  Airway & Oxygen Therapy: Patient Spontanous Breathing and Patient connected to nasal cannula oxygen  Post-op Assessment: Report given to RN and Post -op Vital signs reviewed and stable  Post vital signs: Reviewed and stable  Last Vitals:  Vitals:   10/28/15 0801  BP: (!) 146/95  Pulse: 84  Resp: 16  Temp: (!) 35.7 C    Last Pain:  Vitals:   10/28/15 0801  TempSrc: Tympanic         Complications: No apparent anesthesia complications

## 2015-10-28 NOTE — Anesthesia Preprocedure Evaluation (Signed)
Anesthesia Evaluation  Patient identified by MRN, date of birth, ID band Patient awake    Reviewed: Allergy & Precautions, NPO status , Patient's Chart, lab work & pertinent test results, reviewed documented beta blocker date and time   History of Anesthesia Complications Negative for: history of anesthetic complications  Airway Mallampati: III  TM Distance: >3 FB Neck ROM: Full    Dental  (+) Poor Dentition   Pulmonary neg pulmonary ROS, neg sleep apnea, neg COPD,    breath sounds clear to auscultation- rhonchi (-) wheezing      Cardiovascular Exercise Tolerance: Good hypertension, Pt. on medications and Pt. on home beta blockers (-) CAD and (-) Past MI  Rhythm:Regular Rate:Normal - Systolic murmurs and - Diastolic murmurs    Neuro/Psych negative neurological ROS  negative psych ROS   GI/Hepatic negative GI ROS, Neg liver ROS,   Endo/Other  diabetes, Insulin Dependent, Oral Hypoglycemic Agents  Renal/GU negative Renal ROS     Musculoskeletal negative musculoskeletal ROS (+)   Abdominal (+) - obese,   Peds  Hematology negative hematology ROS (+)   Anesthesia Other Findings Past Medical History: No date: Cellulitis No date: Diabetes mellitus without complication (HCC) No date: Heart palpitations No date: HLD (hyperlipidemia) No date: Hypertension   Reproductive/Obstetrics                             Anesthesia Physical Anesthesia Plan  ASA: III  Anesthesia Plan: General   Post-op Pain Management:    Induction: Intravenous  Airway Management Planned: Natural Airway  Additional Equipment:   Intra-op Plan:   Post-operative Plan:   Informed Consent: I have reviewed the patients History and Physical, chart, labs and discussed the procedure including the risks, benefits and alternatives for the proposed anesthesia with the patient or authorized representative who has indicated  his/her understanding and acceptance.   Dental advisory given  Plan Discussed with: Anesthesiologist and CRNA  Anesthesia Plan Comments:         Anesthesia Quick Evaluation

## 2015-10-29 ENCOUNTER — Encounter
Admission: RE | Disposition: A | Discharge status: Home Health | Payer: Self-pay | Source: Ambulatory Visit | Attending: Gastroenterology

## 2015-10-29 ENCOUNTER — Ambulatory Visit: Payer: BC Managed Care – PPO | Admitting: Anesthesiology

## 2015-10-29 ENCOUNTER — Ambulatory Visit
Admission: RE | Admit: 2015-10-29 | Discharge: 2015-10-29 | Disposition: A | Discharge status: Home Health | Payer: BC Managed Care – PPO | Source: Ambulatory Visit | Attending: Gastroenterology | Admitting: Gastroenterology

## 2015-10-29 ENCOUNTER — Encounter: Payer: Self-pay | Admitting: Gastroenterology

## 2015-10-29 DIAGNOSIS — Z79899 Other long term (current) drug therapy: Secondary | ICD-10-CM | POA: Diagnosis not present

## 2015-10-29 DIAGNOSIS — I10 Essential (primary) hypertension: Secondary | ICD-10-CM | POA: Diagnosis not present

## 2015-10-29 DIAGNOSIS — Z794 Long term (current) use of insulin: Secondary | ICD-10-CM | POA: Insufficient documentation

## 2015-10-29 DIAGNOSIS — Z881 Allergy status to other antibiotic agents status: Secondary | ICD-10-CM | POA: Insufficient documentation

## 2015-10-29 DIAGNOSIS — E785 Hyperlipidemia, unspecified: Secondary | ICD-10-CM | POA: Insufficient documentation

## 2015-10-29 DIAGNOSIS — K529 Noninfective gastroenteritis and colitis, unspecified: Secondary | ICD-10-CM | POA: Diagnosis not present

## 2015-10-29 DIAGNOSIS — E119 Type 2 diabetes mellitus without complications: Secondary | ICD-10-CM | POA: Insufficient documentation

## 2015-10-29 DIAGNOSIS — Z888 Allergy status to other drugs, medicaments and biological substances status: Secondary | ICD-10-CM | POA: Diagnosis not present

## 2015-10-29 DIAGNOSIS — R197 Diarrhea, unspecified: Secondary | ICD-10-CM | POA: Diagnosis present

## 2015-10-29 HISTORY — PX: COLONOSCOPY WITH PROPOFOL: SHX5780

## 2015-10-29 LAB — GLUCOSE, CAPILLARY: Glucose-Capillary: 150 mg/dL — ABNORMAL HIGH (ref 65–99)

## 2015-10-29 SURGERY — COLONOSCOPY WITH PROPOFOL
Anesthesia: General

## 2015-10-29 MED ORDER — SODIUM CHLORIDE 0.9 % IV SOLN: INTRAVENOUS | Status: DC

## 2015-10-29 MED ORDER — PROPOFOL 500 MG/50ML IV EMUL
INTRAVENOUS | Status: DC | PRN
  Administered 2015-10-29: 100 ug/kg/min via INTRAVENOUS

## 2015-10-29 MED ORDER — PROPOFOL 10 MG/ML IV BOLUS
INTRAVENOUS | Status: DC | PRN
  Administered 2015-10-29: 50 mg via INTRAVENOUS

## 2015-10-29 MED ORDER — FENTANYL CITRATE (PF) 100 MCG/2ML IJ SOLN
INTRAMUSCULAR | Status: DC | PRN
  Administered 2015-10-29 (×2): 50 ug via INTRAVENOUS

## 2015-10-29 MED ORDER — SODIUM CHLORIDE 0.9 % IV SOLN
INTRAVENOUS | Status: DC
  Administered 2015-10-29 (×2): via INTRAVENOUS

## 2015-10-29 MED ORDER — MIDAZOLAM HCL 2 MG/2ML IJ SOLN
INTRAMUSCULAR | Status: DC | PRN
  Administered 2015-10-29: 1 mg via INTRAVENOUS

## 2015-10-29 NOTE — H&P (Signed)
Outpatient short stay form Pre-procedure 10/29/2015 1:47 PM Lollie Sails MD  Primary Physician: Dr. Abran Richard, [facility]  Reason for visit:  Colonoscopy  History of present illness:  Patient is a 45 year old male with a personal history of chronic diarrhea. This been going on for at least 8-9 months. He has had cultures done as well as PCR testing which were negative for infectious etiology. He is diabetic and insulin-dependent. He also takes metformin. He has been trialed on some colestipol however this does not allow the stools form and he continues to have lesser number of loose stools with this. He presented yesterday first colonoscopy prep was not good and required repeat prep overnight. He tolerated this overnight. He is presenting today for his procedure.    Current Facility-Administered Medications:  .  0.9 %  sodium chloride infusion, , Intravenous, Continuous, Lollie Sails, MD, Last Rate: 20 mL/hr at 10/29/15 1218 .  0.9 %  sodium chloride infusion, , Intravenous, Continuous, Lollie Sails, MD  Prescriptions Prior to Admission  Medication Sig Dispense Refill Last Dose  . carvedilol (COREG) 6.25 MG tablet Take 1 tablet (6.25 mg total) by mouth 2 (two) times daily with a meal. 60 tablet 0 10/29/2015 at 0700  . lisinopril-hydrochlorothiazide (PRINZIDE,ZESTORETIC) 20-25 MG tablet Take 1 tablet by mouth daily.   10/29/2015 at 0700  . cholestyramine light (PREVALITE) 4 g packet Take 4 g by mouth daily.   Not Taking at Unknown time  . colestipol (COLESTID) 1 g tablet Take 1 g by mouth 2 (two) times daily.   Not Taking at Unknown time  . famotidine (PEPCID) 20 MG tablet Take 20 mg by mouth 2 (two) times daily.   Past Week at Unknown time  . insulin aspart (NOVOLOG) 100 UNIT/ML injection Inject 4 Units into the skin 3 (three) times daily before meals. (Patient not taking: Reported on 10/28/2015) 10 mL 11 Not Taking  . insulin glargine (LANTUS) 100 UNIT/ML  injection Inject 0.2 mLs (20 Units total) into the skin 2 (two) times daily. 10 mL 11   . lisinopril (PRINIVIL) 10 MG tablet Take 1 tablet (10 mg total) by mouth daily. 30 tablet 0 10/27/2015 at Unknown time  . metFORMIN (GLUCOPHAGE) 1000 MG tablet Take 1,000 mg by mouth 2 (two) times daily.  1 10/27/2015 at Unknown time  . mupirocin ointment (BACTROBAN) 2 % Place 1 application into the nose 2 (two) times daily.   10/27/2015 at Unknown time  . rosuvastatin (CRESTOR) 40 MG tablet Take 40 mg by mouth every morning.   Completed Course at Unknown time  . tamsulosin (FLOMAX) 0.4 MG CAPS capsule Take 0.4 mg by mouth every morning.   10/27/2015 at Unknown time  . traZODone (DESYREL) 50 MG tablet Take 50 mg by mouth at bedtime.   Not Taking at Unknown time     Allergies  Allergen Reactions  . Levaquin [Levofloxacin] Swelling  . Phenergan [Promethazine Hcl] Diarrhea     Past Medical History:  Diagnosis Date  . Cellulitis   . Diabetes mellitus without complication ([place])   . Heart palpitations   . HLD (hyperlipidemia)   . Hypertension     Review of systems:      Physical Exam    Heart and lungs: Regular rate and rhythm without rub or gallop, lungs are bilaterally clear.    HEENT: Normocephalic atraumatic eyes are anicteric    Other:     Pertinant exam for procedure: Soft nontender nondistended bowel  sounds positive normoactive.    Planned proceedures: Colonoscopy and indicated procedures. I have discussed the risks benefits and complications of procedures to include not limited to bleeding, infection, perforation and the risk of sedation and the patient wishes to proceed.    Lollie Sails, MD Gastroenterology 10/29/2015  1:47 PM

## 2015-10-29 NOTE — Anesthesia Preprocedure Evaluation (Signed)
Anesthesia Evaluation  Patient identified by MRN, date of birth, ID band Patient awake    Reviewed: Allergy & Precautions, NPO status , Patient's Chart, lab work & pertinent test results, reviewed documented beta blocker date and time   History of Anesthesia Complications Negative for: history of anesthetic complications  Airway Mallampati: III  TM Distance: >3 FB Neck ROM: Full    Dental  (+) Poor Dentition   Pulmonary neg pulmonary ROS, neg sleep apnea, neg COPD,    breath sounds clear to auscultation- rhonchi (-) wheezing      Cardiovascular Exercise Tolerance: Good hypertension, Pt. on medications and Pt. on home beta blockers (-) CAD and (-) Past MI  Rhythm:Regular Rate:Normal - Systolic murmurs and - Diastolic murmurs    Neuro/Psych negative neurological ROS  negative psych ROS   GI/Hepatic negative GI ROS, Neg liver ROS,   Endo/Other  diabetes, Insulin Dependent, Oral Hypoglycemic Agents  Renal/GU negative Renal ROS     Musculoskeletal negative musculoskeletal ROS (+)   Abdominal (+) - obese,   Peds  Hematology negative hematology ROS (+)   Anesthesia Other Findings Past Medical History: No date: Cellulitis No date: Diabetes mellitus without complication (HCC) No date: Heart palpitations No date: HLD (hyperlipidemia) No date: Hypertension   Reproductive/Obstetrics                             Anesthesia Physical  Anesthesia Plan  ASA: III  Anesthesia Plan: General   Post-op Pain Management:    Induction: Intravenous  Airway Management Planned: Natural Airway  Additional Equipment:   Intra-op Plan:   Post-operative Plan:   Informed Consent: I have reviewed the patients History and Physical, chart, labs and discussed the procedure including the risks, benefits and alternatives for the proposed anesthesia with the patient or authorized representative who has indicated  his/her understanding and acceptance.   Dental advisory given  Plan Discussed with: Anesthesiologist and CRNA  Anesthesia Plan Comments:         Anesthesia Quick Evaluation

## 2015-10-29 NOTE — Op Note (Signed)
[facility] Gastroenterology Patient Name: [name] Procedure Date: 10/29/2015 1:43 PM MRN: [id number] Account #: [id number] Date of Birth: [birth year] Admit Type: Outpatient Age: 45 Room: [facility] ENDO ROOM 1 Gender: Male Note Status: Finalized Procedure:            Colonoscopy Indications:          Chronic diarrhea Providers:            Lollie Sails, MD Referring MD:         Brayton Mars. Hunter MD (Referring MD) Medicines:            Monitored Anesthesia Care Complications:        No immediate complications. Procedure:            Pre-Anesthesia Assessment:                       - ASA Grade Assessment: III - A patient with severe                        systemic disease.                       After obtaining informed consent, the colonoscope was                        passed under direct vision. Throughout the procedure,                        the patient's blood pressure, pulse, and oxygen                        saturations were monitored continuously. The                        Colonoscope was introduced through the anus and                        advanced to the the cecum, identified by appendiceal                        orifice and ileocecal valve. The colonoscopy was                        performed without difficulty. The patient tolerated the                        procedure well. The quality of the bowel preparation                        was fair. Findings:      The colon (entire examined portion) appeared normal.      The digital rectal exam was normal.      Biopsies for histology were taken with a cold forceps from the right       colon and left colon for evaluation of microscopic colitis. Impression:           - Preparation of the colon was fair.                       - The entire examined colon is normal.                       -  Biopsies were taken with a cold forceps from the                        right colon and left colon for evaluation of                         microscopic colitis. Recommendation:       - Discharge patient to home. Procedure Code(s):    --- Professional ---                       [phone], Colonoscopy, flexible; with biopsy, single or                        multiple Diagnosis Code(s):    --- Professional ---                       K52.9, Noninfective gastroenteritis and colitis,                        unspecified CPT copyright 2016 [employer]. All rights reserved. The codes documented in this report are preliminary and upon coder review may  be revised to meet current compliance requirements. Lollie Sails, MD 10/29/2015 2:35:22 PM This report has been signed electronically. Number of Addenda: 0 Note Initiated On: 10/29/2015 1:43 PM Scope Withdrawal Time: 0 hours 14 minutes 40 seconds  Total Procedure Duration: 0 hours 28 minutes 31 seconds       [facility]

## 2015-10-29 NOTE — Transfer of Care (Signed)
Immediate Anesthesia Transfer of Care Note  Patient: [name].  Procedure(s) Performed: Procedure(s): COLONOSCOPY WITH PROPOFOL (N/A)  Patient Location: PACU and Endoscopy Unit  Anesthesia Type:General  Level of Consciousness: awake  Airway & Oxygen Therapy: Patient connected to nasal cannula oxygen  Post-op Assessment: Report given to RN  Post vital signs: stable  Last Vitals:  Vitals:   10/29/15 1213  BP: (!) 134/96  Pulse: 86  Resp: 17  Temp: 36.8 C    Last Pain:  Vitals:   10/29/15 1213  TempSrc: Oral         Complications: No apparent anesthesia complications

## 2015-10-30 NOTE — Anesthesia Postprocedure Evaluation (Signed)
Anesthesia Post Note  Patient: [name].  Procedure(s) Performed: Procedure(s) (LRB): COLONOSCOPY WITH PROPOFOL (N/A)  Patient location during evaluation: Endoscopy Anesthesia Type: General Level of consciousness: awake and alert Pain management: pain level controlled Vital Signs Assessment: post-procedure vital signs reviewed and stable Respiratory status: spontaneous breathing, nonlabored ventilation, respiratory function stable and patient connected to nasal cannula oxygen Cardiovascular status: blood pressure returned to baseline and stable Postop Assessment: no signs of nausea or vomiting Anesthetic complications: no    Last Vitals:  Vitals:   10/29/15 1500 10/29/15 1505  BP: (!) 156/97 (!) 164/106  Pulse: 78 77  Resp: 10 10  Temp:      Last Pain:  Vitals:   10/29/15 1505  TempSrc: Tympanic                 Martha Clan

## 2015-10-31 ENCOUNTER — Encounter: Payer: Self-pay | Admitting: Gastroenterology

## 2015-10-31 LAB — SURGICAL PATHOLOGY

## 2016-01-23 ENCOUNTER — Encounter (HOSPITAL_COMMUNITY): Payer: Self-pay | Admitting: *Deleted

## 2016-01-23 ENCOUNTER — Emergency Department (HOSPITAL_COMMUNITY)
Admission: EM | Admit: 2016-01-23 | Discharge: 2016-01-24 | Disposition: A | Discharge status: Home / Self Care | Payer: BC Managed Care – PPO | Attending: Emergency Medicine | Admitting: Emergency Medicine

## 2016-01-23 DIAGNOSIS — L97521 Non-pressure chronic ulcer of other part of left foot limited to breakdown of skin: Secondary | ICD-10-CM | POA: Diagnosis not present

## 2016-01-23 DIAGNOSIS — Z794 Long term (current) use of insulin: Secondary | ICD-10-CM | POA: Insufficient documentation

## 2016-01-23 DIAGNOSIS — Y929 Unspecified place or not applicable: Secondary | ICD-10-CM | POA: Insufficient documentation

## 2016-01-23 DIAGNOSIS — Y939 Activity, unspecified: Secondary | ICD-10-CM | POA: Insufficient documentation

## 2016-01-23 DIAGNOSIS — X16XXXA Contact with hot heating appliances, radiators and pipes, initial encounter: Secondary | ICD-10-CM | POA: Diagnosis not present

## 2016-01-23 DIAGNOSIS — Y999 Unspecified external cause status: Secondary | ICD-10-CM | POA: Insufficient documentation

## 2016-01-23 DIAGNOSIS — T23222A Burn of second degree of single left finger (nail) except thumb, initial encounter: Secondary | ICD-10-CM

## 2016-01-23 DIAGNOSIS — Z79899 Other long term (current) drug therapy: Secondary | ICD-10-CM | POA: Diagnosis not present

## 2016-01-23 DIAGNOSIS — E1165 Type 2 diabetes mellitus with hyperglycemia: Secondary | ICD-10-CM | POA: Insufficient documentation

## 2016-01-23 DIAGNOSIS — I10 Essential (primary) hypertension: Secondary | ICD-10-CM | POA: Diagnosis not present

## 2016-01-23 DIAGNOSIS — T23122A Burn of first degree of single left finger (nail) except thumb, initial encounter: Secondary | ICD-10-CM | POA: Diagnosis present

## 2016-01-23 LAB — URINALYSIS, ROUTINE W REFLEX MICROSCOPIC
Bilirubin Urine: NEGATIVE
Glucose, UA: >1000 mg/dL — AB
KETONES UR: NEGATIVE mg/dL
LEUKOCYTES UA: NEGATIVE
NITRITE: NEGATIVE
PH: 5 (ref 5.0–8.0)
Protein, ur: 100 mg/dL — AB
SPECIFIC GRAVITY, URINE: 1.028 (ref 1.005–1.030)

## 2016-01-23 LAB — BASIC METABOLIC PANEL
ANION GAP: 11 (ref 5–15)
BUN: 32 mg/dL — ABNORMAL HIGH (ref 6–20)
CHLORIDE: 97 mmol/L — AB (ref 101–111)
CO2: 26 mmol/L (ref 22–32)
Calcium: 9.8 mg/dL (ref 8.9–10.3)
Creatinine, Ser: 1.92 mg/dL — ABNORMAL HIGH (ref 0.61–1.24)
GFR calc Af Amer: 46 mL/min — ABNORMAL LOW (ref >60)
GFR calc non Af Amer: 40 mL/min — ABNORMAL LOW (ref >60)
GLUCOSE: 424 mg/dL — AB (ref 65–99)
POTASSIUM: 4 mmol/L (ref 3.5–5.1)
Sodium: 134 mmol/L — ABNORMAL LOW (ref 135–145)

## 2016-01-23 LAB — CBC WITH DIFFERENTIAL/PLATELET
BASOS PCT: 0 %
Basophils Absolute: 0 10*3/uL (ref 0.0–0.1)
EOS PCT: 1 %
Eosinophils Absolute: 0.1 10*3/uL (ref 0.0–0.7)
HEMATOCRIT: 37.2 % — AB (ref 39.0–52.0)
Hemoglobin: 12.2 g/dL — ABNORMAL LOW (ref 13.0–17.0)
Lymphocytes Relative: 28 %
Lymphs Abs: 2.6 10*3/uL (ref 0.7–4.0)
MCH: 23.1 pg — ABNORMAL LOW (ref 26.0–34.0)
MCHC: 32.8 g/dL (ref 30.0–36.0)
MCV: 70.5 fL — AB (ref 78.0–100.0)
MONOS PCT: 10 %
Monocytes Absolute: 0.9 10*3/uL (ref 0.1–1.0)
NEUTROS PCT: 61 %
Neutro Abs: 5.6 10*3/uL (ref 1.7–7.7)
Platelets: 263 10*3/uL (ref 150–400)
RBC: 5.28 MIL/uL (ref 4.22–5.81)
RDW: 13 % (ref 11.5–15.5)
WBC: 9.2 10*3/uL (ref 4.0–10.5)

## 2016-01-23 LAB — CBG MONITORING, ED
Glucose-Capillary: 407 mg/dL — ABNORMAL HIGH (ref 65–99)
Glucose-Capillary: 382 mg/dL — ABNORMAL HIGH (ref 65–99)

## 2016-01-23 LAB — URINE MICROSCOPIC-ADD ON

## 2016-01-23 MED ORDER — SODIUM CHLORIDE 0.9 % IV BOLUS (SEPSIS)
1000.0000 mL | Freq: Once | INTRAVENOUS | Status: AC
  Administered 2016-01-23: 1000 mL via INTRAVENOUS

## 2016-01-23 NOTE — ED Triage Notes (Addendum)
Here for L foot wounds, L anterior ankle and L 2nd toe, h/o DM, blood sugars running high (500s), c/o pain, (denies: nv, fever or other sx), onset 4d ago, worse in last 2d, first time being seen for these wounds.

## 2016-01-23 NOTE — ED Notes (Signed)
Pt alert, NAD, calm, interactive, slow steady cautious gait, to b/r for urine sample. CBG = 407.

## 2016-01-24 ENCOUNTER — Emergency Department (HOSPITAL_COMMUNITY): Payer: BC Managed Care – PPO

## 2016-01-24 LAB — LACTIC ACID, PLASMA: LACTIC ACID, VENOUS: 0.8 mmol/L (ref 0.5–1.9)

## 2016-01-24 MED ORDER — CLINDAMYCIN HCL 150 MG PO CAPS
300.0000 mg | ORAL_CAPSULE | Freq: Three times a day (TID) | ORAL | 0 refills | Status: DC
Start: 2016-01-24 — End: 2016-03-17

## 2016-01-24 MED ORDER — SODIUM CHLORIDE 0.9 % IV BOLUS (SEPSIS)
1000.0000 mL | Freq: Once | INTRAVENOUS | Status: AC
  Administered 2016-01-24: 1000 mL via INTRAVENOUS

## 2016-01-24 MED ORDER — TRAMADOL HCL 50 MG PO TABS: 50.0000 mg | ORAL_TABLET | Freq: Four times a day (QID) | ORAL | 0 refills | Status: DC | PRN

## 2016-01-24 MED ORDER — SILVER SULFADIAZINE 1 % EX CREA
TOPICAL_CREAM | Freq: Once | CUTANEOUS | Status: AC
  Administered 2016-01-24: 01:00:00 via TOPICAL

## 2016-01-24 MED ORDER — SILVER SULFADIAZINE 1 % EX CREA
TOPICAL_CREAM | CUTANEOUS | Status: AC
  Filled 2016-01-24: qty 85
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
## 2016-01-24 NOTE — ED Notes (Signed)
Dressing applied to wound on left foot, left leg, and fingers on left hand.

## 2016-01-26 ENCOUNTER — Encounter: Payer: Self-pay | Admitting: Podiatry

## 2016-01-26 ENCOUNTER — Ambulatory Visit (INDEPENDENT_AMBULATORY_CARE_PROVIDER_SITE_OTHER): Payer: BC Managed Care – PPO | Admitting: Podiatry

## 2016-01-26 VITALS — BP 130/92 | HR 90 | Resp 16 | Ht 75.0 in | Wt 228.0 lb

## 2016-01-26 DIAGNOSIS — E1140 Type 2 diabetes mellitus with diabetic neuropathy, unspecified: Secondary | ICD-10-CM

## 2016-01-26 DIAGNOSIS — L03032 Cellulitis of left toe: Secondary | ICD-10-CM

## 2016-01-26 DIAGNOSIS — E1149 Type 2 diabetes mellitus with other diabetic neurological complication: Secondary | ICD-10-CM | POA: Diagnosis not present
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
## 2016-01-26 NOTE — Patient Instructions (Signed)
ANTIBACTERIAL SOAP INSTRUCTIONS  THE DAY AFTER PROCEDURE  Please follow the instructions your doctor has marked.   Shower as usual. Before getting out, place a drop of antibacterial liquid soap (Dial) on a wet, clean washcloth.  Gently wipe washcloth over affected area.  Afterward, rinse the area with warm water.  Blot the area dry with a soft cloth and cover with antibiotic ointment (neosporin, polysporin, bacitracin) and band aid or gauze and tape  Place 3-4 drops of antibacterial liquid soap in a quart of warm tap water.  Submerge foot into water for 20 minutes.  If bandage was applied after your procedure, leave on to allow for easy lift off, then remove and continue with soak for the remaining time.  Next, blot area dry with a soft cloth and cover with a bandage.  Apply other medications as directed by your doctor, such as cortisporin otic solution (eardrops) or neosporin antibiotic ointment  Soak toe/toes for approximately 1-2 weeks, allow affected toe to air dry periodically throughout the day when not wearing shoes. When wearing a shoe, apply neosporin as needed with a light bandage to protect the nail bed.  Leave original bandage ( this is the big blue bandage you leave the office wearing) in place for 24 hours if tolerable. Begin first soak after the 24 hour period. If severe pain or throbbing occurs you can begin your first soak before the 24 hour period. Submerge toe with bandages on into soapy water to allow bandages to loosen, then remove bandages. Allow area to air dry before dressing with a bandage.  Drainage for approximately 2-4 weeks is normal,  Discomfort to area is to be expected. Tylenol or ibuprofen can be taken as directed if tolerated.  Any severe pain, blistering, increased swelling or redness needs to be evaluated, please call the office with any questions or concerns  

## 2016-01-27 ENCOUNTER — Encounter: Payer: BC Managed Care – PPO | Attending: Internal Medicine | Admitting: Internal Medicine

## 2016-01-27 DIAGNOSIS — T25312D Burn of third degree of left ankle, subsequent encounter: Secondary | ICD-10-CM | POA: Diagnosis not present

## 2016-01-27 DIAGNOSIS — L97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 with other specified severity: Secondary | ICD-10-CM | POA: Diagnosis not present

## 2016-01-27 DIAGNOSIS — T25222D Burn of second degree of left foot, subsequent encounter: Secondary | ICD-10-CM | POA: Diagnosis not present

## 2016-01-27 DIAGNOSIS — X088XXD Exposure to other specified smoke, fire and flames, subsequent encounter: Secondary | ICD-10-CM | POA: Diagnosis not present

## 2016-01-27 DIAGNOSIS — E11622 Type 2 diabetes mellitus with other skin ulcer: Secondary | ICD-10-CM | POA: Diagnosis not present

## 2016-01-27 DIAGNOSIS — I10 Essential (primary) hypertension: Secondary | ICD-10-CM | POA: Insufficient documentation

## 2016-01-27 DIAGNOSIS — T23262D Burn of second degree of back of left hand, subsequent encounter: Secondary | ICD-10-CM | POA: Diagnosis not present

## 2016-01-27 DIAGNOSIS — Z794 Long term (current) use of insulin: Secondary | ICD-10-CM | POA: Diagnosis not present
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
## 2016-01-27 NOTE — Progress Notes (Signed)
[name] ([id number]) Visit Report for 01/27/2016 Abuse/Suicide Risk Screen Details Patient Name: [name] Date of Service: 01/27/2016 9:00 AM Medical Record Patient Account Number: [id number] Number: Treating RN: Cornell Barman Date of Birth/Sex: Jun 16, 1970 (45 y.o. Male) Other Clinician: [place], Physician: [employer] Physician/Extender: Memory Argue Referring Billy Fischer, [employer] Physician: Weeks in 0 Treatment: Abuse/Suicide Risk Screen Items Answer ABUSE/SUICIDE RISK SCREEN: Has anyone close to you tried to hurt or harm you recentlyo No Do you feel uncomfortable with anyone in your familyo No Has anyone forced you do things that you didnot want to doo No Do you have any thoughts of harming yourselfo No Patient displays signs or symptoms of abuse and/or neglect. No Electronic Signature(s) Signed: 01/27/2016 11:25:25 AM By: Gretta Cool, RN, BSN, Kim RN, BSN Entered By: Gretta Cool, RN, BSN, Kim on 01/27/2016 09:31:38 [name] ([id number]) -------------------------------------------------------------------------------- Activities of Daily Living Details Patient Name: [name] Date of Service: 01/27/2016 9:00 AM Medical Record Patient Account Number: [id number] Number: Treating RN: Cornell Barman Date of Birth/Sex: 1970-08-23 (45 y.o. Male) Other Clinician: [facility] [place], Physician: [employer] Physician/Extender: Memory Argue Referring Gareth Morgan Physician: Weeks in 0 Treatment: Activities of Daily Living Items Answer Activities of Daily Living (Please select one for each item) Drive Automobile Completely Able Take Medications Completely Able Use Telephone Completely [place] for Appearance Completely Able Use Toilet Completely Able Bath / Shower Completely Able Dress Self Completely Able Feed Self Completely Able Walk Completely Able Get In /  Out Bed Completely Able Housework Completely Able Prepare Meals Completely [place] for Self Completely Able Electronic Signature(s) Signed: 01/27/2016 11:25:25 AM By: Gretta Cool, RN, BSN, Kim RN, BSN Entered By: Gretta Cool, RN, BSN, Kim on 01/27/2016 09:31:47 [name] ([id number]) -------------------------------------------------------------------------------- Education Assessment Details Patient Name: [name] Date of Service: 01/27/2016 9:00 AM Medical Record Patient Account Number: [id number] Number: Treating RN: Cornell Barman Date of Birth/Sex: Mar 17, 1970 (45 y.o. Male) Other Clinician: [place], Physician: [employer] Physician/Extender: Memory Argue Referring Gareth Morgan Physician: [place] in 0 Treatment: Primary Learner Assessed: Patient Learning Preferences/Education Level/Primary Language Highest Education Level: College or Above Preferred Language: English Cognitive Barrier Assessment/Beliefs Language Barrier: No Translator Needed: No Memory Deficit: No Emotional Barrier: No Cultural/Religious Beliefs Affecting Medical No Care: Physical Barrier Assessment Impaired Vision: No Impaired Hearing: No Decreased Hand dexterity: No Knowledge/Comprehension Assessment Knowledge Level: High Comprehension Level: High Ability to understand written High instructions: Ability to understand verbal High instructions: Motivation Assessment Anxiety Level: Calm Cooperation: Cooperative Education Importance: Acknowledges Need Interest in Health Problems: Asks Questions Perception: Coherent Willingness to Engage in Self- High Management Activities: Readiness to Engage in Self- High Management Activities: [name] ([id number]) Electronic Signature(s) Signed: 01/27/2016 11:25:25 AM By: Gretta Cool, RN, BSN, Kim RN, BSN Entered By: Gretta Cool, RN, BSN, Kim on 01/27/2016  09:32:19 [name] ([id number]) -------------------------------------------------------------------------------- Fall Risk Assessment Details Patient Name: [name] Date of Service: 01/27/2016 9:00 AM Medical Record Patient Account Number: [id number] Number: Treating RN: Cornell Barman Date of Birth/Sex: 06/13/1970 (44 y.o. Male) Other Clinician: [place], Physician: [employer] Physician/Extender: Memory Argue Referring Gareth Morgan Physician: Weeks in 0 Treatment: Fall Risk Assessment Items Have you had 2 or more falls in the last 12 monthso 0 Yes Have you had any fall that resulted in injury in the last 12 monthso 0 Yes FALL RISK ASSESSMENT:  History of falling - immediate or within 3 months 0 No Secondary diagnosis 0 No Ambulatory aid None/bed rest/wheelchair/nurse 0 Yes Crutches/cane/walker 0 No Furniture 0 No IV Access/Saline Lock 0 No Gait/Training Normal/bed rest/immobile 0 Yes Weak 0 No Impaired 0 No Mental Status Oriented to own ability 0 Yes Electronic Signature(s) Signed: 01/27/2016 11:25:25 AM By: Gretta Cool, RN, BSN, Kim RN, BSN Entered By: Gretta Cool, RN, BSN, Kim on 01/27/2016 09:32:29 [name] ([id number]) -------------------------------------------------------------------------------- Foot Assessment Details Patient Name: [name] Date of Service: 01/27/2016 9:00 AM Medical Record Patient Account Number: [id number] Number: Treating RN: Cornell Barman Date of Birth/Sex: [birth year] (45 y.o. Male) Other Clinician: Primary [facility] [place], Physician: [employer] Physician/Extender: Memory Argue Referring Billy Fischer, [place] Physician: Weeks in 0 Treatment: Foot Assessment Items Site Locations + = Sensation present, - = Sensation absent, C = Callus, U = Ulcer R = Redness, W = Warmth, M = Maceration, PU = Pre-ulcerative lesion F = Fissure, S =  Swelling, D = Dryness Assessment Right: Left: Other Deformity: No No Prior Foot Ulcer: No No Prior Amputation: No No Charcot Joint: No No Ambulatory Status: Ambulatory Without Help Gait: Steady Electronic Signature(s) Signed: 01/27/2016 11:25:25 AM By: Gretta Cool, RN, BSN, Kim RN, BSN Entered By: Gretta Cool, RN, BSN, Kim on 01/27/2016 09:34:02 [name] ([id number]) [name] ([id number]) -------------------------------------------------------------------------------- Nutrition Risk Assessment Details Patient Name: [name] Date of Service: 01/27/2016 9:00 AM Medical Record Patient Account Number: [id number] Number: Treating RN: Cornell Barman Date of Birth/Sex: 1970-12-11 (45 y.o. Male) Other Clinician: Primary [facility] [place], Physician: [employer] Physician/Extender: Memory Argue Referring Billy Fischer, [place] Physician: Weeks in 0 Treatment: Height (in): 75 Weight (lbs): 228 Body Mass Index (BMI): 28.5 Nutrition Risk Assessment Items NUTRITION RISK SCREEN: I have an illness or condition that made me change the kind and/or 0 No amount of food I eat I eat fewer than two meals per day 0 No I eat few fruits and vegetables, or milk products 0 No I have three or more drinks of beer, liquor or wine almost every day 0 No I have tooth or mouth problems that make it hard for me to eat 0 No I don't always have enough money to buy the food I need 0 No I eat alone most of the time 0 No I take three or more different prescribed or over-the-counter drugs a 0 No day Without wanting to, I have lost or gained 10 pounds in the last six 0 No months I am not always physically able to shop, cook and/or feed myself 0 No Nutrition Protocols Good Risk Protocol 0 No interventions needed Moderate Risk Protocol Electronic Signature(s) Signed: 01/27/2016 11:25:25 AM By: Gretta Cool, RN, BSN, Kim RN, BSN Entered By: Gretta Cool, RN, BSN, Kim on 01/27/2016  09:32:35
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
## 2016-02-03 ENCOUNTER — Encounter: Payer: BC Managed Care – PPO | Admitting: Internal Medicine

## 2016-02-03 DIAGNOSIS — E11622 Type 2 diabetes mellitus with other skin ulcer: Secondary | ICD-10-CM | POA: Diagnosis not present
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
## 2016-02-10 ENCOUNTER — Encounter: Payer: BC Managed Care – PPO | Admitting: Internal Medicine

## 2016-02-10 DIAGNOSIS — E11622 Type 2 diabetes mellitus with other skin ulcer: Secondary | ICD-10-CM | POA: Diagnosis not present

## 2016-02-11 NOTE — Progress Notes (Signed)
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 DOSSIE, SWOR 02/10/2016 10:15 Date of Service: AM Medical  Record [id number] Number: Patient Account Number: [id number] Date of Birth/Sex: 06/03/1970 (45 y.o. Male) Treating RN: Baruch Gouty, RN, BSN, [facility] Other Clinician: Physician: [facility] Treating ROBSON, MICHAEL Referring [facility] Physician/Extender: G Physician: [facility] Weeks in 2 Treatment: Wound Status Wound Number: 4 Primary Diabetic Wound/Ulcer of the Lower Etiology: Extremity Wound Location: Left Toe Great - Circumfernential Wound Status: Open Wounding Event: Gradually Appeared Comorbid Hypertension, Type II Diabetes History: Date Acquired: 01/27/2016 Weeks Of Treatment: 1 Clustered Wound: No Photos Photo Uploaded By: Regan Lemming on 02/10/2016 16:23:02 Wound Measurements Length: (cm) 3.5 Width: (cm) 3.8 Depth: (cm) 0.2 Area: (cm) 10.446 Volume: (cm) 2.089 % Reduction in Area: -26.7% % Reduction in Volume: -153.2% Epithelialization: None Tunneling: No Undermining: No Wound Description Classification: Grade 2 Wound Margin: Indistinct, nonvisible Exudate Amount: Large Exudate Type: Serosanguineous Exudate Color: red, brown Foul Odor After Cleansing: No Wound Bed Whittaker, Levin ([id number]) Granulation Amount: Small (1-33%) Exposed Structure Granulation Quality: Pink, Pale Fascia Exposed: No Necrotic Amount: Large (67-100%) Fat Layer Exposed: No Necrotic Quality: Eschar, Adherent Slough Tendon Exposed: No Muscle Exposed: No Joint Exposed: No Bone Exposed: No Limited to Skin Breakdown Periwound Skin Texture Texture Color No Abnormalities Noted: No No Abnormalities Noted: No Callus: No Atrophie Blanche: No Crepitus: No Cyanosis: No Excoriation: No Ecchymosis: No Fluctuance: No Erythema: No Friable: No Hemosiderin Staining: No Induration: No Mottled: No Localized Edema: Yes Pallor: No Rash: No Rubor: No Scarring: No Temperature / Pain Moisture Temperature: No Abnormality No Abnormalities  Noted:  No Tenderness on Palpation: Yes Dry / Scaly: No Maceration: No Moist: Yes Wound Preparation Ulcer Cleansing: Rinsed/Irrigated with Saline Topical Anesthetic Applied: Other: lidocaine 4%, Treatment Notes Wound #4 (Left, Circumferential Toe Great) 1. Cleansed with: Clean wound with Normal Saline 4. Dressing Applied: Aquacel Ag 5. Secondary Dressing Applied Gauze and Kerlix/Conform 7. Secured with Recruitment consultant) Signed: 02/10/2016 4:26:19 PM By: Regan Lemming BSN, RN Entered By: Regan Lemming on 02/10/2016 10:24:09 [name] ([id number]) [place], [name] ([id number]) -------------------------------------------------------------------------------- Wound Assessment Details Patient Name: GUAGE, EFFERSON 02/10/2016 10:15 Date of Service: AM Medical Record [id number] Number: Patient Account Number: [id number] Date of Birth/Sex: 1970/09/24 (45 y.o. Male) Treating RN: Baruch Gouty, RN, BSN, [place] [facility] [place] Other Clinician: Physician: [facility] Treating ROBSON, MICHAEL Referring [facility] Physician/Extender: G Physician: [facility] Weeks in 2 Treatment: Wound Status Wound Number: 5 Primary Diabetic Wound/Ulcer of the Lower Etiology: Extremity Wound Location: Left Foot - Dorsal Wound Status: Open Wounding Event: Gradually Appeared Comorbid Hypertension, Type II Diabetes Date Acquired: 01/27/2016 History: Weeks Of Treatment: 1 Clustered Wound: No Photos Photo Uploaded By: Regan Lemming on 02/10/2016 16:23:28 Wound Measurements Length: (cm) 1.5 Width: (cm) 0.5 Depth: (cm) 0.1 Area: (cm) 0.589 Volume: (cm) 0.059 % Reduction in Area: 68.8% % Reduction in Volume: 68.6% Epithelialization: None Tunneling: No Undermining: No Wound Description Classification: Grade 1 Lamping, Trevian ([id number]) Foul Odor After Cleansing: No Wound Margin: Distinct, outline attached Exudate Amount: Medium Exudate Type:  Serosanguineous Exudate Color: red, brown Wound Bed Granulation Amount: Large (67-100%) Exposed Structure Granulation Quality: Pink, Pale Fascia Exposed: No Necrotic Amount: None Present (0%) Fat Layer Exposed: No Tendon Exposed: No Muscle Exposed: No Joint Exposed: No Bone Exposed: No Limited to Skin Breakdown Periwound Skin Texture Texture Color No Abnormalities Noted: No No Abnormalities Noted: No Callus: No Atrophie Blanche: No Crepitus: No Cyanosis: No Excoriation: No Ecchymosis: No Fluctuance: No Erythema: No Friable: No Hemosiderin Staining: No Induration: No Mottled: No Localized Edema: Yes Pallor: No Rash: No Rubor: No Scarring: No Temperature / Pain Moisture Temperature: No Abnormality No Abnormalities Noted: No Tenderness on Palpation: Yes Dry / Scaly: No Maceration: No Moist: Yes Wound Preparation Ulcer Cleansing: Rinsed/Irrigated with Saline Topical Anesthetic Applied: Other: lidocaine 4%, Treatment Notes Wound #5 (Left, Dorsal Foot) 1. Cleansed with: Clean wound with Normal Saline 4. Dressing Applied: Aquacel Ag 5. Secondary Dressing Applied Gauze and Kerlix/Conform 7. Secured with ROSENDO, COUSER ([id number]) Tape Electronic Signature(s) Signed: 02/10/2016 4:26:19 PM By: Regan Lemming BSN, RN Entered By: Regan Lemming on 02/10/2016 10:29:42 [name] ([id number]) -------------------------------------------------------------------------------- Vitals Details Patient Name: JERNARD, REIBER 02/10/2016 10:15 Date of Service: AM Medical Record [id number] Number: Patient Account Number: [id number] Date of Birth/Sex: [birth year] (45 y.o. Male) Treating RN: Afful, RN, BSN, [facility] Primary [facility] [place] Other Clinician: Physician: [facility] Treating ROBSON, MICHAEL Referring [place] Physician/Extender: G Physician: [facility] Weeks in 2 Treatment: Vital Signs Time Taken: 10:15 Temperature (F):  98.1 Height (in): 75 Pulse (bpm): 90 Weight (lbs): 228 Respiratory Rate (breaths/min): 19 Body Mass Index (BMI): 28.5 Blood Pressure (mmHg): 143/78 Reference Range: 80 - 120 mg / dl Electronic Signature(s) Signed: 02/10/2016 4:26:19 PM By: Regan Lemming BSN, RN Entered By: Regan Lemming on 02/10/2016 10:16:28
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
## 2016-02-17 ENCOUNTER — Ambulatory Visit: Payer: BLUE CROSS/BLUE SHIELD | Admitting: Internal Medicine

## 2016-02-18 ENCOUNTER — Other Ambulatory Visit: Payer: Self-pay | Admitting: Internal Medicine

## 2016-02-18 ENCOUNTER — Ambulatory Visit
Admission: RE | Admit: 2016-02-18 | Discharge: 2016-02-18 | Disposition: A | Discharge status: Home / Self Care | Payer: BC Managed Care – PPO | Source: Ambulatory Visit | Attending: Internal Medicine | Admitting: Internal Medicine

## 2016-02-18 ENCOUNTER — Encounter: Payer: BC Managed Care – PPO | Admitting: Internal Medicine

## 2016-02-18 DIAGNOSIS — S81809A Unspecified open wound, unspecified lower leg, initial encounter: Secondary | ICD-10-CM

## 2016-02-18 DIAGNOSIS — E11622 Type 2 diabetes mellitus with other skin ulcer: Secondary | ICD-10-CM | POA: Diagnosis not present

## 2016-02-18 DIAGNOSIS — E1169 Type 2 diabetes mellitus with other specified complication: Secondary | ICD-10-CM | POA: Insufficient documentation

## 2016-02-18 DIAGNOSIS — M84478A Pathological fracture, left toe(s), initial encounter for fracture: Secondary | ICD-10-CM | POA: Diagnosis not present

## 2016-02-18 DIAGNOSIS — T148XXA Other injury of unspecified body region, initial encounter: Secondary | ICD-10-CM | POA: Diagnosis present

## 2016-02-18 DIAGNOSIS — M868X7 Other osteomyelitis, ankle and foot: Secondary | ICD-10-CM | POA: Diagnosis not present
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
## 2016-02-19 NOTE — Progress Notes (Signed)
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 Referring [facility] Physician/Extender: G Physician: [facility] Weeks in 3 Treatment: Vital Signs Time  Taken: 12:50 Temperature (F): 98.1 Height (in): 75 Pulse (bpm): 87 Weight (lbs): 228 Respiratory Rate (breaths/min): 18 Body Mass Index (BMI): 28.5 Blood Pressure (mmHg): 144/76 Reference Range: 80 - 120 mg / dl Electronic Signature(s) Signed: 02/18/2016 12:56:25 PM By: Regan Lemming BSN, RN Entered By: Regan Lemming on 02/18/2016 12:56:24

## 2016-02-24 ENCOUNTER — Encounter: Payer: BC Managed Care – PPO | Attending: Internal Medicine | Admitting: Internal Medicine

## 2016-02-24 ENCOUNTER — Telehealth: Payer: Self-pay | Admitting: *Deleted

## 2016-02-24 DIAGNOSIS — X58XXXD Exposure to other specified factors, subsequent encounter: Secondary | ICD-10-CM | POA: Diagnosis not present

## 2016-02-24 DIAGNOSIS — L97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 with other specified severity: Secondary | ICD-10-CM | POA: Diagnosis not present

## 2016-02-24 DIAGNOSIS — T25312S Burn of third degree of left ankle, sequela: Secondary | ICD-10-CM | POA: Insufficient documentation

## 2016-02-24 DIAGNOSIS — T23262D Burn of second degree of back of left hand, subsequent encounter: Secondary | ICD-10-CM | POA: Insufficient documentation

## 2016-02-24 DIAGNOSIS — T25222D Burn of second degree of left foot, subsequent encounter: Secondary | ICD-10-CM | POA: Insufficient documentation

## 2016-02-24 DIAGNOSIS — Z794 Long term (current) use of insulin: Secondary | ICD-10-CM | POA: Insufficient documentation

## 2016-02-24 DIAGNOSIS — E11622 Type 2 diabetes mellitus with other skin ulcer: Secondary | ICD-10-CM | POA: Insufficient documentation

## 2016-02-24 NOTE — Telephone Encounter (Signed)
Called patient at [phone] (Cell #) to check to see how they were doing from when they got their nail removed on Monday, January 26, 2016. Pt stated, "My toe got an infection (not from nail removal) and might have to get it amputated per the wound care people".  Pt will be coming back to see Dr. Paulla Dolly on Thursday, February 26, 2016 at 3:15 pm.
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
## 2016-02-26 ENCOUNTER — Encounter: Payer: Self-pay | Admitting: Podiatry

## 2016-02-26 ENCOUNTER — Ambulatory Visit: Payer: BC Managed Care – PPO

## 2016-02-26 ENCOUNTER — Ambulatory Visit (INDEPENDENT_AMBULATORY_CARE_PROVIDER_SITE_OTHER): Payer: BC Managed Care – PPO | Admitting: Podiatry

## 2016-02-26 ENCOUNTER — Ambulatory Visit (INDEPENDENT_AMBULATORY_CARE_PROVIDER_SITE_OTHER): Payer: BC Managed Care – PPO

## 2016-02-26 VITALS — BP 92/61 | HR 91 | Resp 16

## 2016-02-26 DIAGNOSIS — E1149 Type 2 diabetes mellitus with other diabetic neurological complication: Secondary | ICD-10-CM

## 2016-02-26 DIAGNOSIS — M79675 Pain in left toe(s): Secondary | ICD-10-CM

## 2016-02-26 DIAGNOSIS — E1140 Type 2 diabetes mellitus with diabetic neuropathy, unspecified: Secondary | ICD-10-CM

## 2016-02-26 DIAGNOSIS — IMO0002 Reserved for concepts with insufficient information to code with codable children: Secondary | ICD-10-CM

## 2016-02-26 DIAGNOSIS — L98499 Non-pressure chronic ulcer of skin of other sites with unspecified severity: Secondary | ICD-10-CM | POA: Diagnosis not present
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
## 2016-02-26 NOTE — Progress Notes (Signed)
   Subjective:    Patient ID: [name]., male    DOB: 01-May-1970, 46 y.o.   MRN: [id number]  HPI    Review of Systems     Objective:   Physical Exam        Assessment & Plan:

## 2016-02-26 NOTE — Patient Instructions (Signed)
Pre-Operative Instructions  Congratulations, you have decided to take an important step to improving your quality of life.  You can be assured that the doctors of [facility] will be with you every step of the way.  1. Plan to be at the surgery center/hospital at least 1 (one) hour prior to your scheduled time unless otherwise directed by the surgical center/hospital staff.  You must have a responsible adult accompany you, remain during the surgery and drive you home.  Make sure you have directions to the surgical center/hospital and know how to get there on time. 2. For hospital based surgery you will need to obtain a history and physical form from your family physician within 1 month prior to the date of surgery- we will give you a form for you primary physician.  3. We make every effort to accommodate the date you request for surgery.  There are however, times where surgery dates or times have to be moved.  We will contact you as soon as possible if a change in schedule is required.   4. No Aspirin/Ibuprofen for one week before surgery.  If you are on aspirin, any non-steroidal anti-inflammatory medications (Mobic, Aleve, Ibuprofen) you should stop taking it 7 days prior to your surgery.  You make take Tylenol  For pain prior to surgery.  5. Medications- If you are taking daily heart and blood pressure medications, seizure, reflux, allergy, asthma, anxiety, pain or diabetes medications, make sure the surgery center/hospital is aware before the day of surgery so they may notify you which medications to take or avoid the day of surgery. 6. No food or drink after midnight the night before surgery unless directed otherwise by surgical center/hospital staff. 7. No alcoholic beverages 24 hours prior to surgery.  No smoking 24 hours prior to or 24 hours after surgery. 8. Wear loose pants or shorts- loose enough to fit over bandages, boots, and casts. 9. No slip on shoes, sneakers are best. 10. Bring  your boot with you to the surgery center/hospital.  Also bring crutches or a walker if your physician has prescribed it for you.  If you do not have this equipment, it will be provided for you after surgery. 11. If you have not been contracted by the surgery center/hospital by the day before your surgery, call to confirm the date and time of your surgery. 12. Leave-time from work may vary depending on the type of surgery you have.  Appropriate arrangements should be made prior to surgery with your employer. 13. Prescriptions will be provided immediately following surgery by your doctor.  Have these filled as soon as possible after surgery and take the medication as directed. 14. Remove nail polish on the operative foot. 15. Wash the night before surgery.  The night before surgery wash the foot and leg well with the antibacterial soap provided and water paying special attention to beneath the toenails and in between the toes.  Rinse thoroughly with water and dry well with a towel.  Perform this wash unless told not to do so by your physician.  Enclosed: 1 Ice pack (please put in freezer the night before surgery)   1 Hibiclens skin cleaner   Pre-op Instructions  If you have any questions regarding the instructions, do not hesitate to call our office.  [place]: [address] [place], [place] [address] [phone]  [place]: [address] [place], [place] [address] [phone]  [place]: [address]  [place], [place] [address] [phone]   Dr.   Yissel Habermehl DPM, Dr. Matthew Wagoner DPM, Dr. M. Todd Hyatt DPM, Dr. Titorya Stover DPM 

## 2016-03-02 ENCOUNTER — Ambulatory Visit: Payer: BC Managed Care – PPO | Admitting: Internal Medicine

## 2016-03-03 ENCOUNTER — Encounter (HOSPITAL_COMMUNITY): Payer: Self-pay | Admitting: Emergency Medicine

## 2016-03-03 ENCOUNTER — Telehealth: Payer: Self-pay | Admitting: *Deleted

## 2016-03-03 ENCOUNTER — Emergency Department (HOSPITAL_COMMUNITY)
Admission: EM | Admit: 2016-03-03 | Discharge: 2016-03-04 | Disposition: A | Discharge status: Home / Self Care | Payer: BC Managed Care – PPO | Attending: Emergency Medicine | Admitting: Emergency Medicine

## 2016-03-03 DIAGNOSIS — M869 Osteomyelitis, unspecified: Secondary | ICD-10-CM | POA: Diagnosis not present

## 2016-03-03 DIAGNOSIS — E119 Type 2 diabetes mellitus without complications: Secondary | ICD-10-CM | POA: Diagnosis not present

## 2016-03-03 DIAGNOSIS — Z79899 Other long term (current) drug therapy: Secondary | ICD-10-CM | POA: Insufficient documentation

## 2016-03-03 DIAGNOSIS — I10 Essential (primary) hypertension: Secondary | ICD-10-CM | POA: Insufficient documentation

## 2016-03-03 DIAGNOSIS — Z794 Long term (current) use of insulin: Secondary | ICD-10-CM | POA: Diagnosis not present

## 2016-03-03 DIAGNOSIS — M79672 Pain in left foot: Secondary | ICD-10-CM | POA: Diagnosis present

## 2016-03-03 LAB — CBC WITH DIFFERENTIAL/PLATELET
BASOS ABS: 0.1 10*3/uL (ref 0.0–0.1)
BASOS PCT: 1 %
Eosinophils Absolute: 0.3 10*3/uL (ref 0.0–0.7)
Eosinophils Relative: 3 %
HEMATOCRIT: 38.9 % — AB (ref 39.0–52.0)
Hemoglobin: 12.6 g/dL — ABNORMAL LOW (ref 13.0–17.0)
LYMPHS ABS: 3.5 10*3/uL (ref 0.7–4.0)
Lymphocytes Relative: 39 %
MCH: 22.8 pg — AB (ref 26.0–34.0)
MCHC: 32.4 g/dL (ref 30.0–36.0)
MCV: 70.5 fL — AB (ref 78.0–100.0)
MONOS PCT: 6 %
Monocytes Absolute: 0.5 10*3/uL (ref 0.1–1.0)
NEUTROS ABS: 4.6 10*3/uL (ref 1.7–7.7)
Neutrophils Relative %: 51 %
Platelets: 252 10*3/uL (ref 150–400)
RBC: 5.52 MIL/uL (ref 4.22–5.81)
RDW: 12.8 % (ref 11.5–15.5)
WBC: 9 10*3/uL (ref 4.0–10.5)

## 2016-03-03 LAB — CBG MONITORING, ED: GLUCOSE-CAPILLARY: 359 mg/dL — AB (ref 65–99)

## 2016-03-03 LAB — COMPREHENSIVE METABOLIC PANEL
ALK PHOS: 75 U/L (ref 38–126)
ALT: 25 U/L (ref 17–63)
ANION GAP: 9 (ref 5–15)
AST: 19 U/L (ref 15–41)
Albumin: 2.9 g/dL — ABNORMAL LOW (ref 3.5–5.0)
BILIRUBIN TOTAL: 0.8 mg/dL (ref 0.3–1.2)
BUN: 28 mg/dL — ABNORMAL HIGH (ref 6–20)
CALCIUM: 9.2 mg/dL (ref 8.9–10.3)
CO2: 28 mmol/L (ref 22–32)
Chloride: 97 mmol/L — ABNORMAL LOW (ref 101–111)
Creatinine, Ser: 1.63 mg/dL — ABNORMAL HIGH (ref 0.61–1.24)
GFR, EST AFRICAN AMERICAN: 56 mL/min — AB (ref >60)
GFR, EST NON AFRICAN AMERICAN: 48 mL/min — AB (ref >60)
Glucose, Bld: 365 mg/dL — ABNORMAL HIGH (ref 65–99)
POTASSIUM: 3.8 mmol/L (ref 3.5–5.1)
Sodium: 134 mmol/L — ABNORMAL LOW (ref 135–145)
TOTAL PROTEIN: 6.9 g/dL (ref 6.5–8.1)

## 2016-03-03 NOTE — Telephone Encounter (Signed)
"  I was supposed to have surgery today.  They wouldn't do it because my glucose was too high.  I would like to reschedule as soon as possible.  I called my doctor and she just to double my dose of insulin and that will help get it under control."  You will have to get medical clearance from your doctor before he can do your surgery.  I have an appointment with her on January 22 but I don't want to wait that long.  I want to get this taken care of as soon as possible.  I will call her now and see if I can get in sooner.  I'll let you know something later today."

## 2016-03-03 NOTE — ED Triage Notes (Signed)
Pt. Stated, I was suppose to have my left big toe amputated today with Dr. Earleen Newport and they didn't do it because it was infected and my sugar was too high. Im here cause the pain is so bad I can' stand it any longer.  I take tramadol and no help.

## 2016-03-03 NOTE — ED Provider Notes (Signed)
[facility] DEPT Provider Note   CSN: [id number] Arrival date & time: 03/03/16  1805  By signing my name below, I, Julien Nordmann, attest that this documentation has been prepared under the direction and in the presence of Everlene Balls, MD.  Electronically Signed: Julien Nordmann, ED Scribe. 03/03/16. 11:58 PM.    History   Chief Complaint Chief Complaint  Patient presents with  . Toe Pain  . Hyperglycemia  . Foot Pain   The history is provided by the patient. No language interpreter was used.   HPI Comments: [name]. is a 46 y.o. male who has a PMhx of DM, HLD, and HTN presents to the Emergency Department complaining of gradual worsening, left great toe pain due to hyperglycemia this morning. Pt states he was supposed to have his left great toe amputated today by Dr. Earleen Newport. He expresses that they were unable to amputate his toe due to his blood sugar being too high. He notes his blood sugar was ~ 600 at the time when checked. Pt states he is to follow up with his PCP to get his blood sugar down before having the surgery rescheduled. He has been compliant with Doxycycline and Clindamycin for his wounds. Pt is followed by Dr. Paulla Dolly (podiatrist) for his diabetic wounds in his foot. He has been taking Tramadol to alleviate his pain without relief. Pt has an appointment with his PCP on 01/16. Pt has no other complaints. Past Medical History:  Diagnosis Date  . Cellulitis   . Diabetes mellitus without complication ([place])   . Heart palpitations   . HLD (hyperlipidemia)   . Hypertension     Patient Active Problem List   Diagnosis Date Noted  . Cellulitis, face 06/24/2015  . Facial cellulitis 06/23/2015  . AKI (acute kidney injury) ([place]) 06/23/2015  . Diarrhea 06/23/2015  . Diabetes mellitus without complication ([place])   . HLD (hyperlipidemia)   . Essential hypertension   . Sepsis ([place]) 03/07/2015    Past Surgical History:  Procedure Laterality Date  . ARTHROSCOPIC  REPAIR ACL    . COLONOSCOPY WITH PROPOFOL N/A 10/28/2015   Procedure: COLONOSCOPY WITH PROPOFOL;  Surgeon: Lollie Sails, MD;  Location[facility] ENDOSCOPY;  Service: Endoscopy;  Laterality: N/A;  . COLONOSCOPY WITH PROPOFOL N/A 10/29/2015   Procedure: COLONOSCOPY WITH PROPOFOL;  Surgeon: Lollie Sails, MD;  Location: [facility] ENDOSCOPY;  Service: Endoscopy;  Laterality: N/A;  . PROSTATE SURGERY  2016  . TONSILECTOMY/ADENOIDECTOMY WITH MYRINGOTOMY    . TONSILLECTOMY         Home Medications    Prior to Admission medications   Medication Sig Start Date End Date Taking? Authorizing Provider  carvedilol (COREG) 6.25 MG tablet Take 1 tablet (6.25 mg total) by mouth 2 (two) times daily with a meal. 06/28/15   Bonnell Public, MD  clindamycin (CLEOCIN) 150 MG capsule Take 2 capsules (300 mg total) by mouth 3 (three) times daily. 01/24/16   Charlann Lange, PA-C  doxycycline (MONODOX) 100 MG capsule Take 100 mg by mouth 2 (two) times daily. 02/18/16   Historical Provider, MD  insulin aspart (NOVOLOG) 100 UNIT/ML injection Inject 4 Units into the skin 3 (three) times daily before meals. 03/10/15   Fritzi Mandes, MD  insulin glargine (LANTUS) 100 UNIT/ML injection Inject 0.2 mLs (20 Units total) into the skin 2 (two) times daily. 06/28/15   Bonnell Public, MD  lisinopril (PRINIVIL) 10 MG tablet Take 1 tablet (10 mg total) by mouth daily. 06/28/15  Bonnell Public, MD  lisinopril-hydrochlorothiazide (PRINZIDE,ZESTORETIC) 20-25 MG tablet Take 1 tablet by mouth daily.    Historical Provider, MD  tamsulosin (FLOMAX) 0.4 MG CAPS capsule Take 0.4 mg by mouth every morning. 03/26/15   Historical Provider, MD  traMADol (ULTRAM) 50 MG tablet Take 1 tablet (50 mg total) by mouth every 6 (six) hours as needed. 01/24/16   Charlann Lange, PA-C    Family History Family History  Problem Relation Age of Onset  . CAD Father   . Diabetes Mellitus II Mother     Social History Social History  Substance Use Topics  .  Smoking status: Never Smoker  . Smokeless tobacco: Never Used  . Alcohol use No     Allergies   Levaquin [levofloxacin] and Phenergan [promethazine hcl]   Review of Systems Review of Systems  A complete 10 system review of systems was obtained and all systems are negative except as noted in the HPI and PMH.    Physical Exam Updated Vital Signs BP 113/85 (BP Location: Left Arm)   Pulse 96   Temp 98.2 F (36.8 C) (Oral)   Resp 17   Ht 6\' 3"  (1.905 m)   Wt 228 lb (103.4 kg)   SpO2 97%   BMI 28.50 kg/m   Physical Exam  Constitutional: He is oriented to person, place, and time. Vital signs are normal. He appears well-developed and well-nourished.  Non-toxic appearance. He does not appear ill. No distress.  HENT:  Head: Normocephalic and atraumatic.  Nose: Nose normal.  Mouth/Throat: Oropharynx is clear and moist. No oropharyngeal exudate.  Eyes: Conjunctivae and EOM are normal. Pupils are equal, round, and reactive to light. No scleral icterus.  Neck: Normal range of motion. Neck supple. No tracheal deviation, no edema, no erythema and normal range of motion present. No thyroid mass and no thyromegaly present.  Cardiovascular: Normal rate, regular rhythm, S1 normal, S2 normal, normal heart sounds, intact distal pulses and normal pulses.  Exam reveals no gallop and no friction rub.   No murmur heard. Pulmonary/Chest: Effort normal and breath sounds normal. No respiratory distress. He has no wheezes. He has no rhonchi. He has no rales.  Abdominal: Soft. Normal appearance and bowel sounds are normal. He exhibits no distension, no ascites and no mass. There is no hepatosplenomegaly. There is no tenderness. There is no rebound, no guarding and no CVA tenderness.  Musculoskeletal: Normal range of motion. He exhibits tenderness. He exhibits no edema.  transverse amputation of his left great toe, no purulent drainage, no warmth or erythema, mild TTP, normal pulse and sensation in left  foot  Lymphadenopathy:    He has no cervical adenopathy.  Neurological: He is alert and oriented to person, place, and time. He has normal strength. No cranial nerve deficit or sensory deficit.  Skin: Skin is warm, dry and intact. No petechiae and no rash noted. He is not diaphoretic. No erythema. No pallor.  Nursing note and vitals reviewed.    ED Treatments / Results  DIAGNOSTIC STUDIES: Oxygen Saturation is 97% on RA, normal by my interpretation.  COORDINATION OF CARE:  11:57 PM Discussed treatment plan with pt at bedside and pt agreed to plan.  Labs (all labs ordered are listed, but only abnormal results are displayed) Labs Reviewed  CBC WITH DIFFERENTIAL/PLATELET - Abnormal; Notable for the following:       Result Value   Hemoglobin 12.6 (*)    HCT 38.9 (*)    MCV 70.5 (*)  MCH 22.8 (*)    All other components within normal limits  COMPREHENSIVE METABOLIC PANEL - Abnormal; Notable for the following:    Sodium 134 (*)    Chloride 97 (*)    Glucose, Bld 365 (*)    BUN 28 (*)    Creatinine, Ser 1.63 (*)    Albumin 2.9 (*)    GFR calc non Af Amer 48 (*)    GFR calc Af Amer 56 (*)    All other components within normal limits  CBG MONITORING, ED - Abnormal; Notable for the following:    Glucose-Capillary 359 (*)    All other components within normal limits    EKG  EKG Interpretation None       Radiology No results found.  Procedures Procedures (including critical care time)  Medications Ordered in ED Medications - No data to display   Initial Impression / Assessment and Plan / ED Course  I have reviewed the triage vital signs and the nursing notes.  Pertinent labs & imaging results that were available during my care of the patient were reviewed by me and considered in my medical decision making (see chart for details).  Clinical Course     Patient presents to the ED for worsening toe pain uncontrolled by tramadol.  His exam shows tenderness and he  has xrays recently showing osteomyelitis.  He is already on 2 antibiotics for treatment and has follow up for surgery.  Will give norco for further pain control and advise PCP fu (which he has in 1 week) for medical clearance so he can get his surgery.  He currently does not have any systemic spread of this infection.  He demonstrates good understanding of the plan. He appears well and in NAD. FS here is 365 with anion gap of 9.  No emergent need for admission regarding blood sugar.  VS remain within his normal limits and he is safe for DC.    Final Clinical Impressions(s) / ED Diagnoses   Final diagnoses:  None   I personally performed the services described in this documentation, which was scribed in my presence. The recorded information has been reviewed and is accurate.    New Prescriptions New Prescriptions   No medications on file     Everlene Balls, MD 03/04/16 0023

## 2016-03-04 MED ORDER — HYDROCODONE-ACETAMINOPHEN 5-325 MG PO TABS
2.0000 | ORAL_TABLET | Freq: Once | ORAL | Status: AC
  Administered 2016-03-04: 2 via ORAL
  Filled 2016-03-04: qty 2

## 2016-03-04 MED ORDER — HYDROCODONE-ACETAMINOPHEN 5-325 MG PO TABS: 1.0000 | ORAL_TABLET | Freq: Two times a day (BID) | ORAL | 0 refills | Status: DC | PRN

## 2016-03-04 NOTE — Telephone Encounter (Signed)
Can you please order an HbA1c for him? Thanks.

## 2016-03-08 ENCOUNTER — Encounter: Payer: Self-pay | Admitting: Podiatry

## 2016-03-08 ENCOUNTER — Ambulatory Visit (INDEPENDENT_AMBULATORY_CARE_PROVIDER_SITE_OTHER): Payer: BC Managed Care – PPO | Admitting: Podiatry

## 2016-03-08 VITALS — BP 103/74 | HR 97 | Temp 98.3°F | Resp 18

## 2016-03-08 DIAGNOSIS — IMO0002 Reserved for concepts with insufficient information to code with codable children: Secondary | ICD-10-CM

## 2016-03-08 DIAGNOSIS — E1140 Type 2 diabetes mellitus with diabetic neuropathy, unspecified: Secondary | ICD-10-CM | POA: Diagnosis not present

## 2016-03-08 DIAGNOSIS — L98499 Non-pressure chronic ulcer of skin of other sites with unspecified severity: Secondary | ICD-10-CM | POA: Diagnosis not present

## 2016-03-08 DIAGNOSIS — E1149 Type 2 diabetes mellitus with other diabetic neurological complication: Principal | ICD-10-CM

## 2016-03-08 MED ORDER — HYDROCODONE-ACETAMINOPHEN 5-325 MG PO TABS: 1.0000 | ORAL_TABLET | Freq: Two times a day (BID) | ORAL | 0 refills | Status: DC | PRN

## 2016-03-08 NOTE — Patient Instructions (Signed)

## 2016-03-09 ENCOUNTER — Telehealth: Payer: Self-pay | Admitting: *Deleted

## 2016-03-09 ENCOUNTER — Encounter: Payer: BC Managed Care – PPO | Admitting: Internal Medicine

## 2016-03-09 DIAGNOSIS — E11622 Type 2 diabetes mellitus with other skin ulcer: Secondary | ICD-10-CM | POA: Diagnosis not present

## 2016-03-09 NOTE — Progress Notes (Signed)
Chart reviewed by Dr Smith Robert and states that patients glucose needs to be controlled before he has surgery. Delydia at Dr Leigh Aurora notified of this.

## 2016-03-09 NOTE — Progress Notes (Signed)
Subjective: 46 year old male presents to the office today for concerns of his left toe ulcer and worsening pain to the toe for which he has been taking vicodin for. He was scheduled for surgery last week, unfortunately his sugar was over 500 and it was cancelled before I even got to see him. He then went to the ER and was discharged. He presents today for follow-up. He would like to go ahead and proceed with toe amputation of his left big toe ASAP. Denies any systemic complaints such as fevers, chills, nausea, vomiting. No acute changes since last appointment, and no other complaints at this time.   Objective: AAO x3, NAD DP/PT pulses palpable 2/4bilaterally, CRT less than 3 seconds Protective sensation decreased with Simms Weinstein monofilament, There is continuation of nonhealing wounds the distal aspect of  Left hallux with a fibro-granular wound base. There is probing very close to bone. There is no drainage or pus expressed. There is minimal edema to the toe and there is no ascending cellulitis. There is no pus expressed today. There is no increase in warmth to the foot. There is also starting of a wound on the left second toe however this is been healing and he has not noticed any drainage or pus in there is none today. There is no edema, erythema to the second toe. The wound on the anterior of the leg appears to be improved and a half the size of what was compared to the previous picture. There is no swelling erythema, ascending cellulitis, fluctuance, crepitus, malodor. No areas of pinpoint bony tenderness or pain with vibratory sensation. MMT 5/5, ROM WNL..  No open lesions or pre-ulcerative lesions.  No pain with calf compression, swelling, warmth, erythema  02/26/16     Assessment: Nonhealing ulcer left hallux   Plan: -All treatment options discussed with the patient including all alternatives, risks, complications.  -At this time, he wishes to undergo amputation of the left big toe  this week given the increase in pain to the toe. He is currently on antibiotics (doxycycline and clindamycin) and he reports to taking both still. -I began discussed wit to have the to and he understands this will most likely be entire toe and he is wanting to go ahead and proceed. His sugar has been better controlled but I will try to get him scheduled at [facility] this week.  -The incision placement as well as the postoperative course was discussed with the patient. I discussed risks of the surgery which include, but not limited to, infection, bleeding, pain, swelling, need for further surgery, delayed or nonhealing, painful or ugly scar, numbness or sensation changes, over/under correction, recurrence, transfer lesions, further deformity, hardware failure, DVT/PE, loss of toe/foot. Patient understands these risks and wishes to proceed with surgery. The surgical consent was reviewed with the patient all 3 pages were signed. No promises or guarantees were given to the outcome of the procedure. All questions were answered to the best of my ability. Before the surgery the patient was encouraged to call the office if there is any further questions. The surgery will be performed at [facility] day vs. Hospital  on an outpatient basis. -If there is any worsening between now and the surgery to go immediately to the ER.  -Patient encouraged to call the office with any questions, concerns, change in symptoms.   Celesta Gentile, DPM

## 2016-03-09 NOTE — Telephone Encounter (Signed)
Per [name], I scheduled patient for surgery at [facility] center on 03/12/2016 at 7:30am.  I called and informed patient and I faxed history and physical form to Dr. Yong Channel.  Patient said he is scheduled to see Dr. Yong Channel tomorrow morning.  I told him I would sent H&P form to be safe.  He said he saw her last week.

## 2016-03-09 NOTE — Telephone Encounter (Signed)
"  I'm working on his chart.  He's scheduled for tomorrow with [name].  Unless he gets his sugar down, we are not going to be able to do him tomorrow.  If he comes in tomorrow and it's glucose level is still 365 mg/dl, we will not treat him.  He will need medical clearance from his primary care doctor before surgery saying he's okay."  I left Tammy a message that surgery is scheduled for 03/12/16.  Patient is going to see his primary care doctor tomorrow.  We are aware that if his levels are too high he will not be able to have the procedure.  We will keep you posted.
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
## 2016-03-11 ENCOUNTER — Telehealth: Payer: Self-pay | Admitting: Podiatry

## 2016-03-11 ENCOUNTER — Encounter (HOSPITAL_BASED_OUTPATIENT_CLINIC_OR_DEPARTMENT_OTHER): Payer: Self-pay | Admitting: Anesthesiology

## 2016-03-11 NOTE — Telephone Encounter (Signed)
Attempted to call patient again about tomorrow. No answer. Left message
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
## 2016-03-11 NOTE — Pre-Procedure Instructions (Signed)
Talked with Dr. Earleen Newport to inform that pt had not been able to get medical clearance. Dr. Earleen Newport to follow up and try to move case to main OR or reschedule for Monday. Cancelling case for 03/12/16 at [facility]. Called pt to inform that he should not come to [facility] in am and that Dr. Earleen Newport or his office will be in touch with further instruction.

## 2016-03-11 NOTE — Telephone Encounter (Signed)
Per [name], I called [facility] and canceled surgery for tomorrow, 03/12/2016.  I am calling to let you know we canceled your surgery for tomorrow.  Have you rescheduled your appointment with your primary care physician?  No, not yet, they have been closed.  Maybe I can on tomorrow."  You will not be able to have surgery if your glucose level is still elevated.  [name] wants you to have your A1c checked as well.  Can you get your doctor to do this?  "Okay, I will."
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
## 2016-03-11 NOTE — Anesthesia Preprocedure Evaluation (Deleted)
Anesthesia Evaluation  Patient identified by MRN, date of birth, ID band Patient awake    Reviewed: Allergy & Precautions, NPO status , Patient's Chart, lab work & pertinent test results, reviewed documented beta blocker date and time   History of Anesthesia Complications Negative for: history of anesthetic complications  Airway Mallampati: III  TM Distance: >3 FB Neck ROM: Full    Dental  (+) Poor Dentition   Pulmonary neg pulmonary ROS, neg sleep apnea, neg COPD,    breath sounds clear to auscultation- rhonchi (-) wheezing      Cardiovascular Exercise Tolerance: Good hypertension, Pt. on medications and Pt. on home beta blockers (-) CAD and (-) Past MI  Rhythm:Regular Rate:Normal - Systolic murmurs and - Diastolic murmurs    Neuro/Psych negative neurological ROS  negative psych ROS   GI/Hepatic negative GI ROS, Neg liver ROS,   Endo/Other  diabetes, Poorly Controlled, Type 2, Insulin Dependent, Oral Hypoglycemic Agents  Renal/GU Renal disease     Musculoskeletal negative musculoskeletal ROS (+)   Abdominal (+) - obese,   Peds  Hematology negative hematology ROS (+)   Anesthesia Other Findings Past Medical History: No date: Cellulitis No date: Diabetes mellitus without complication (HCC) No date: Heart palpitations No date: HLD (hyperlipidemia) No date: Hypertension   Reproductive/Obstetrics                            Anesthesia Physical  Anesthesia Plan  ASA: III  Anesthesia Plan: General   Post-op Pain Management:    Induction: Intravenous  Airway Management Planned: LMA  Additional Equipment:   Intra-op Plan:   Post-operative Plan: Extubation in OR  Informed Consent: I have reviewed the patients History and Physical, chart, labs and discussed the procedure including the risks, benefits and alternatives for the proposed anesthesia with the patient or authorized  representative who has indicated his/her understanding and acceptance.   Dental advisory given  Plan Discussed with: CRNA  Anesthesia Plan Comments:         Anesthesia Quick Evaluation

## 2016-03-12 ENCOUNTER — Telehealth: Payer: Self-pay | Admitting: *Deleted

## 2016-03-12 ENCOUNTER — Ambulatory Visit (HOSPITAL_BASED_OUTPATIENT_CLINIC_OR_DEPARTMENT_OTHER): Admission: RE | Admit: 2016-03-12 | Payer: BC Managed Care – PPO | Source: Ambulatory Visit | Admitting: Podiatry

## 2016-03-12 ENCOUNTER — Encounter: Payer: Self-pay | Admitting: Podiatry

## 2016-03-12 ENCOUNTER — Encounter (HOSPITAL_BASED_OUTPATIENT_CLINIC_OR_DEPARTMENT_OTHER): Admission: RE | Payer: Self-pay | Source: Ambulatory Visit

## 2016-03-12 ENCOUNTER — Encounter: Payer: BC Managed Care – PPO | Admitting: Podiatry

## 2016-03-12 SURGERY — AMPUTATION, TOE
Anesthesia: General | Laterality: Left

## 2016-03-12 NOTE — Progress Notes (Signed)
Patient called back on 03/11/16 and I discussed with him surgery. He is going to his PCP in the morning. Unfortunately we will have to cancel his surgery for 03/12/16 until he gets medical clearance. If there are any worsening s/s of infection, directed to go to the ER. Patient is in agreement. Continue abx.

## 2016-03-12 NOTE — Telephone Encounter (Signed)
I am calling to let you know we have you scheduled for surgery on Monday.  We have not received your medical clearance.  "I know, I could not see my doctor today.  They are closed today again.  He said that he would have me admitted and then do my surgery when I talked to him last."  I'll check with him and see what he says and give you a call back.  I spoke to [name].  He said he wants you to come in for an appointment on Monday to see him.  If it is not looking any better he may admit you.  So, come prepared to be admitted just in case.  "Okay, thank you."  I'm going to transfer you to a scheduler.  I called and canceled surgery with Garnette Czech scheduler.  Reason for the cancellation is that patient did not get clearance from his primary care doctor.  His doctor's office was closed today

## 2016-03-15 ENCOUNTER — Inpatient Hospital Stay (HOSPITAL_COMMUNITY)
Admission: EM | Admit: 2016-03-15 | Discharge: 2016-03-17 | DRG: 617 | Disposition: A | Discharge status: Home Health | Payer: BC Managed Care – PPO | Attending: Internal Medicine | Admitting: Internal Medicine

## 2016-03-15 ENCOUNTER — Ambulatory Visit (INDEPENDENT_AMBULATORY_CARE_PROVIDER_SITE_OTHER): Payer: BC Managed Care – PPO | Admitting: Podiatry

## 2016-03-15 ENCOUNTER — Emergency Department (HOSPITAL_COMMUNITY): Payer: BC Managed Care – PPO

## 2016-03-15 ENCOUNTER — Encounter (HOSPITAL_COMMUNITY): Payer: Self-pay | Admitting: Emergency Medicine

## 2016-03-15 ENCOUNTER — Encounter (HOSPITAL_BASED_OUTPATIENT_CLINIC_OR_DEPARTMENT_OTHER): Admission: RE | Payer: Self-pay | Source: Ambulatory Visit

## 2016-03-15 ENCOUNTER — Ambulatory Visit (HOSPITAL_BASED_OUTPATIENT_CLINIC_OR_DEPARTMENT_OTHER): Admission: RE | Admit: 2016-03-15 | Payer: BC Managed Care – PPO | Source: Ambulatory Visit | Admitting: Podiatry

## 2016-03-15 ENCOUNTER — Encounter: Payer: Self-pay | Admitting: Podiatry

## 2016-03-15 VITALS — BP 88/58 | HR 86 | Temp 98.8°F | Resp 16

## 2016-03-15 DIAGNOSIS — Z8249 Family history of ischemic heart disease and other diseases of the circulatory system: Secondary | ICD-10-CM | POA: Diagnosis not present

## 2016-03-15 DIAGNOSIS — E785 Hyperlipidemia, unspecified: Secondary | ICD-10-CM | POA: Diagnosis present

## 2016-03-15 DIAGNOSIS — Z881 Allergy status to other antibiotic agents status: Secondary | ICD-10-CM

## 2016-03-15 DIAGNOSIS — L97529 Non-pressure chronic ulcer of other part of left foot with unspecified severity: Secondary | ICD-10-CM | POA: Diagnosis present

## 2016-03-15 DIAGNOSIS — E119 Type 2 diabetes mellitus without complications: Secondary | ICD-10-CM

## 2016-03-15 DIAGNOSIS — IMO0002 Reserved for concepts with insufficient information to code with codable children: Secondary | ICD-10-CM

## 2016-03-15 DIAGNOSIS — I10 Essential (primary) hypertension: Secondary | ICD-10-CM | POA: Diagnosis present

## 2016-03-15 DIAGNOSIS — Z833 Family history of diabetes mellitus: Secondary | ICD-10-CM | POA: Diagnosis not present

## 2016-03-15 DIAGNOSIS — Z794 Long term (current) use of insulin: Secondary | ICD-10-CM

## 2016-03-15 DIAGNOSIS — E1165 Type 2 diabetes mellitus with hyperglycemia: Secondary | ICD-10-CM | POA: Diagnosis present

## 2016-03-15 DIAGNOSIS — E1140 Type 2 diabetes mellitus with diabetic neuropathy, unspecified: Secondary | ICD-10-CM

## 2016-03-15 DIAGNOSIS — Z888 Allergy status to other drugs, medicaments and biological substances status: Secondary | ICD-10-CM

## 2016-03-15 DIAGNOSIS — M869 Osteomyelitis, unspecified: Secondary | ICD-10-CM | POA: Diagnosis not present

## 2016-03-15 DIAGNOSIS — Z79899 Other long term (current) drug therapy: Secondary | ICD-10-CM | POA: Diagnosis not present

## 2016-03-15 DIAGNOSIS — L98499 Non-pressure chronic ulcer of skin of other sites with unspecified severity: Secondary | ICD-10-CM

## 2016-03-15 DIAGNOSIS — L97509 Non-pressure chronic ulcer of other part of unspecified foot with unspecified severity: Secondary | ICD-10-CM

## 2016-03-15 DIAGNOSIS — E11621 Type 2 diabetes mellitus with foot ulcer: Principal | ICD-10-CM

## 2016-03-15 DIAGNOSIS — E1151 Type 2 diabetes mellitus with diabetic peripheral angiopathy without gangrene: Secondary | ICD-10-CM | POA: Diagnosis present

## 2016-03-15 DIAGNOSIS — E1149 Type 2 diabetes mellitus with other diabetic neurological complication: Secondary | ICD-10-CM

## 2016-03-15 DIAGNOSIS — Z01810 Encounter for preprocedural cardiovascular examination: Secondary | ICD-10-CM | POA: Diagnosis not present

## 2016-03-15 DIAGNOSIS — Z23 Encounter for immunization: Secondary | ICD-10-CM | POA: Diagnosis not present

## 2016-03-15 DIAGNOSIS — E1169 Type 2 diabetes mellitus with other specified complication: Secondary | ICD-10-CM | POA: Diagnosis present

## 2016-03-15 DIAGNOSIS — Z9889 Other specified postprocedural states: Secondary | ICD-10-CM

## 2016-03-15 DIAGNOSIS — M86679 Other chronic osteomyelitis, unspecified ankle and foot: Secondary | ICD-10-CM | POA: Diagnosis not present

## 2016-03-15 LAB — CBC WITH DIFFERENTIAL/PLATELET
BASOS ABS: 0 10*3/uL (ref 0.0–0.1)
Basophils Relative: 0 %
EOS ABS: 0.2 10*3/uL (ref 0.0–0.7)
Eosinophils Relative: 3 %
HCT: 36 % — ABNORMAL LOW (ref 39.0–52.0)
Hemoglobin: 11.6 g/dL — ABNORMAL LOW (ref 13.0–17.0)
LYMPHS ABS: 3.2 10*3/uL (ref 0.7–4.0)
Lymphocytes Relative: 41 %
MCH: 22.8 pg — ABNORMAL LOW (ref 26.0–34.0)
MCHC: 32.2 g/dL (ref 30.0–36.0)
MCV: 70.7 fL — ABNORMAL LOW (ref 78.0–100.0)
Monocytes Absolute: 0.6 10*3/uL (ref 0.1–1.0)
Monocytes Relative: 7 %
NEUTROS ABS: 3.9 10*3/uL (ref 1.7–7.7)
Neutrophils Relative %: 49 %
Platelets: 238 10*3/uL (ref 150–400)
RBC: 5.09 MIL/uL (ref 4.22–5.81)
RDW: 13.6 % (ref 11.5–15.5)
WBC: 7.9 10*3/uL (ref 4.0–10.5)

## 2016-03-15 LAB — BASIC METABOLIC PANEL
Anion gap: 11 (ref 5–15)
BUN: 32 mg/dL — AB (ref 6–20)
CALCIUM: 8.8 mg/dL — AB (ref 8.9–10.3)
CO2: 24 mmol/L (ref 22–32)
CREATININE: 1.53 mg/dL — AB (ref 0.61–1.24)
Chloride: 99 mmol/L — ABNORMAL LOW (ref 101–111)
GFR calc Af Amer: >60 mL/min (ref >60)
GFR calc non Af Amer: 52 mL/min — ABNORMAL LOW (ref >60)
Glucose, Bld: 355 mg/dL — ABNORMAL HIGH (ref 65–99)
Potassium: 4.1 mmol/L (ref 3.5–5.1)
SODIUM: 134 mmol/L — AB (ref 135–145)

## 2016-03-15 LAB — I-STAT CG4 LACTIC ACID, ED: LACTIC ACID, VENOUS: 1.74 mmol/L (ref 0.5–1.9)

## 2016-03-15 SURGERY — AMPUTATION, FOOT, TRANSMETATARSAL
Anesthesia: General | Laterality: Left

## 2016-03-15 MED ORDER — OXYCODONE-ACETAMINOPHEN 5-325 MG PO TABS
ORAL_TABLET | ORAL | Status: AC
  Filled 2016-03-15: qty 1

## 2016-03-15 MED ORDER — INSULIN GLARGINE 100 UNIT/ML ~~LOC~~ SOLN
15.0000 [IU] | Freq: Two times a day (BID) | SUBCUTANEOUS | Status: DC
  Administered 2016-03-16 – 2016-03-17 (×4): 15 [IU] via SUBCUTANEOUS
  Filled 2016-03-15 (×5): qty 0.15

## 2016-03-15 MED ORDER — DOXYCYCLINE MONOHYDRATE 100 MG PO CAPS: 100.0000 mg | ORAL_CAPSULE | Freq: Two times a day (BID) | ORAL | Status: DC

## 2016-03-15 MED ORDER — TAMSULOSIN HCL 0.4 MG PO CAPS
0.4000 mg | ORAL_CAPSULE | Freq: Every morning | ORAL | Status: DC
  Administered 2016-03-16 – 2016-03-17 (×2): 0.4 mg via ORAL
  Filled 2016-03-15 (×2): qty 1

## 2016-03-15 MED ORDER — CARVEDILOL 6.25 MG PO TABS
6.2500 mg | ORAL_TABLET | Freq: Two times a day (BID) | ORAL | Status: DC
  Administered 2016-03-16 – 2016-03-17 (×2): 6.25 mg via ORAL
  Filled 2016-03-15 (×2): qty 1

## 2016-03-15 MED ORDER — OXYCODONE-ACETAMINOPHEN 5-325 MG PO TABS
1.0000 | ORAL_TABLET | ORAL | Status: DC | PRN
  Administered 2016-03-17: 1 via ORAL
  Filled 2016-03-15 (×2): qty 1

## 2016-03-15 MED ORDER — OXYCODONE-ACETAMINOPHEN 5-325 MG PO TABS
1.0000 | ORAL_TABLET | Freq: Once | ORAL | Status: AC
  Administered 2016-03-15: 1 via ORAL
  Filled 2016-03-15: qty 1

## 2016-03-15 MED ORDER — DEXTROSE 5 % IV SOLN
2.0000 g | INTRAVENOUS | Status: DC
  Administered 2016-03-16 (×2): 2 g via INTRAVENOUS
  Filled 2016-03-15 (×2): qty 2

## 2016-03-15 MED ORDER — OXYCODONE-ACETAMINOPHEN 5-325 MG PO TABS
1.0000 | ORAL_TABLET | Freq: Once | ORAL | Status: AC
  Administered 2016-03-15: 1 via ORAL

## 2016-03-15 MED ORDER — VANCOMYCIN HCL 10 G IV SOLR
2000.0000 mg | Freq: Once | INTRAVENOUS | Status: AC
  Administered 2016-03-15: 2000 mg via INTRAVENOUS
  Filled 2016-03-15: qty 2000

## 2016-03-15 MED ORDER — INSULIN ASPART 100 UNIT/ML ~~LOC~~ SOLN
0.0000 [IU] | SUBCUTANEOUS | Status: DC
  Administered 2016-03-16: 5 [IU] via SUBCUTANEOUS
  Administered 2016-03-16: 3 [IU] via SUBCUTANEOUS
  Administered 2016-03-16: 5 [IU] via SUBCUTANEOUS
  Administered 2016-03-16: 3 [IU] via SUBCUTANEOUS
  Administered 2016-03-16 – 2016-03-17 (×3): 2 [IU] via SUBCUTANEOUS
  Administered 2016-03-17: 3 [IU] via SUBCUTANEOUS
  Administered 2016-03-17: 1 [IU] via SUBCUTANEOUS
  Filled 2016-03-15 (×4): qty 1

## 2016-03-15 MED ORDER — VANCOMYCIN HCL IN DEXTROSE 750-5 MG/150ML-% IV SOLN
750.0000 mg | Freq: Two times a day (BID) | INTRAVENOUS | Status: DC
  Administered 2016-03-16 – 2016-03-17 (×3): 750 mg via INTRAVENOUS
  Filled 2016-03-15 (×6): qty 150

## 2016-03-15 MED ORDER — METRONIDAZOLE 500 MG PO TABS
500.0000 mg | ORAL_TABLET | Freq: Three times a day (TID) | ORAL | Status: DC
  Administered 2016-03-16 – 2016-03-17 (×5): 500 mg via ORAL
  Filled 2016-03-15 (×5): qty 1

## 2016-03-15 NOTE — Progress Notes (Signed)
Subjective: 46 year old male presents to the office today for concerns of his left toe ulcer and for worsening pain. He has been taking Vicodin for the pain. I have been trying to get [name] scheduled as an outpatient but given high blood sugar this has been cancelled now twice. He presents today for follow-up. He has continued on antibiotics. Denies any systemic complaints such as fevers, chills, nausea, vomiting. No acute changes since last appointment, and no other complaints at this time.   Objective: AAO x3, NAD DP/PT pulses palpable 2/4bilaterally, CRT less than 3 seconds Protective sensation decreased with Simms Weinstein monofilament, There is continuation of nonhealing wounds the distal aspect of  Left hallux with a fibro-granular wound base. There is probing of the wound.There is serous drainage expressed but no pus. There is tenderness to palpation over the ulcer. There is no ascending cellulitis. Mild malodor.  The ulcer to the 2nd toe appears to be healing.  The wound on the distal anterior leg is starting to scab and does have evidence of healing. There is no swelling erythema or ascending cellulitis. There is no fluctuance or crepitus or malodor. No open lesions or pre-ulcerative lesions.  No pain with calf compression, swelling, warmth, erythema  02/26/16     Assessment: Nonhealing ulcer left hallux   Plan: -All treatment options discussed with the patient including all alternatives, risks, complications.  -At this time his blood pressure is low. Upon initial his blood pressure is 83/56 and was rechecked and was 88/62. Given the increased pain as well as nonhealing wound concern for infection as well as blood pressure I would like to admit the patient to the hospital for this. Also would like to plan on possible toe amputation on Tuesday afternoon/evening. Concerns relative blood sugar as well as hypertension. I contacted [name]. I discussed the case with him and I have  sent the patient emergency room for further evaluation and hopeful admission. -I will be more than happy to follow the patient while in the hospital. Will plan for toe amputation on Tuesday evening. NPO after midnight 1/22 for surgery on 1/23 if medically cleared to have surgery Tuesday.  -Patient sent to the ER and his friend drove him there.   Celesta Gentile, DPM

## 2016-03-15 NOTE — H&P (Addendum)
History and Physical    [name]. ELF:[id number] DOB: 02/10/1971 DOA: 03/15/2016   PCP: [facility] Chief Complaint:  Chief Complaint  Patient presents with  . Foot Pain    HPI: [name]. is a 46 y.o. male with medical history significant of DM, HTN.  Patient sent in to ED by DPM with L great toe osteomyelitis and need for amputation.  Multiple attempts to do this as outpatient unsuccessful due to elevated BGL on day of surgery.  Patient has severe L great toe pain.  Vicodin helps somewhat.  Nothing makes it worse.  Has remained on doxycyline as outpatient.  Question of BPs in the 80s in office today.  BPs all running 585I systolic in ED however.   Review of Systems: As per HPI otherwise 10 point review of systems negative.    Past Medical History:  Diagnosis Date  . Cellulitis   . Diabetes mellitus without complication ([place])   . Heart palpitations   . HLD (hyperlipidemia)   . Hypertension     Past Surgical History:  Procedure Laterality Date  . ARTHROSCOPIC REPAIR ACL    . COLONOSCOPY WITH PROPOFOL N/A 10/28/2015   Procedure: COLONOSCOPY WITH PROPOFOL;  Surgeon: Lollie Sails, MD;  Location: [facility] ENDOSCOPY;  Service: Endoscopy;  Laterality: N/A;  . COLONOSCOPY WITH PROPOFOL N/A 10/29/2015   Procedure: COLONOSCOPY WITH PROPOFOL;  Surgeon: Lollie Sails, MD;  Location: [facility] ENDOSCOPY;  Service: Endoscopy;  Laterality: N/A;  . PROSTATE SURGERY  2016  . TONSILECTOMY/ADENOIDECTOMY WITH MYRINGOTOMY    . TONSILLECTOMY       reports that he has never smoked. He has never used smokeless tobacco. He reports that he does not drink alcohol or use drugs.  Allergies  Allergen Reactions  . Levaquin [Levofloxacin] Swelling  . Phenergan [Promethazine Hcl] Diarrhea    Family History  Problem Relation Age of Onset  . CAD Father   . Diabetes Mellitus II Mother       Prior to Admission medications   Medication Sig Start Date End  Date Taking? Authorizing Provider  carvedilol (COREG) 6.25 MG tablet Take 1 tablet (6.25 mg total) by mouth 2 (two) times daily with a meal. 06/28/15  Yes Bonnell Public, MD  clindamycin (CLEOCIN) 150 MG capsule Take 2 capsules (300 mg total) by mouth 3 (three) times daily. 01/24/16  Yes Shari Upstill, PA-C  doxycycline (MONODOX) 100 MG capsule Take 100 mg by mouth 2 (two) times daily. 02/18/16  Yes Historical Provider, MD  HYDROcodone-acetaminophen (NORCO/VICODIN) 5-325 MG tablet Take 1 tablet by mouth 2 (two) times daily as needed. Patient taking differently: Take 1 tablet by mouth 2 (two) times daily as needed for severe pain.  03/08/16  Yes Trula Slade, DPM  insulin aspart (NOVOLOG) 100 UNIT/ML injection Inject 4 Units into the skin 3 (three) times daily before meals. 03/10/15  Yes Fritzi Mandes, MD  insulin glargine (LANTUS) 100 UNIT/ML injection Inject 0.2 mLs (20 Units total) into the skin 2 (two) times daily. 06/28/15  Yes Bonnell Public, MD  lisinopril-hydrochlorothiazide (PRINZIDE,ZESTORETIC) 20-25 MG tablet Take 1 tablet by mouth daily.   Yes Historical Provider, MD  rosuvastatin (CRESTOR) 40 MG tablet Take 40 mg by mouth daily. 03/15/16  Yes Historical Provider, MD  SANTYL ointment Apply 1 application topically every other day. 01/27/16  Yes Historical Provider, MD  tamsulosin (FLOMAX) 0.4 MG CAPS capsule Take 0.4 mg by mouth every morning. 03/26/15  Yes Historical Provider,  MD    Physical Exam: Vitals:   03/15/16 1500 03/15/16 1502 03/15/16 2007  BP: 120/77  131/78  Pulse: 85  82  Resp: 16  16  Temp: 98.4 F (36.9 C)  97.7 F (36.5 C)  TempSrc: Oral  Oral  SpO2: 100%  100%  Weight:  103.4 kg (228 lb)   Height:  6\' 3"  (1.905 m)       Constitutional: NAD, calm, comfortable Eyes: PERRL, lids and conjunctivae normal ENMT: Mucous membranes are moist. Posterior pharynx clear of any exudate or lesions.Normal dentition.  Neck: normal, supple, no masses, no  thyromegaly Respiratory: clear to auscultation bilaterally, no wheezing, no crackles. Normal respiratory effort. No accessory muscle use.  Cardiovascular: Regular rate and rhythm, no murmurs / rubs / gallops. No extremity edema. 2+ pedal pulses. No carotid bruits.  Abdomen: no tenderness, no masses palpated. No hepatosplenomegaly. Bowel sounds positive.  Musculoskeletal: no clubbing / cyanosis. No joint deformity upper and lower extremities. Good ROM, no contractures. Normal muscle tone.  Skin:  Neurologic: CN 2-12 grossly intact. Sensation intact, DTR normal. Strength 5/5 in all 4.  Psychiatric: Normal judgment and insight. Alert and oriented x 3. Normal mood.    Labs on Admission: I have personally reviewed following labs and imaging studies  CBC:  Recent Labs Lab 03/15/16 2022  WBC 7.9  NEUTROABS 3.9  HGB 11.6*  HCT 36.0*  MCV 70.7*  PLT 161   Basic Metabolic Panel:  Recent Labs Lab 03/15/16 2022  NA 134*  K 4.1  CL 99*  CO2 24  GLUCOSE 355*  BUN 32*  CREATININE 1.53*  CALCIUM 8.8*   GFR: Estimated Creatinine Clearance: 76.9 mL/min (by C-G formula based on SCr of 1.53 mg/dL (H)). Liver Function Tests: No results for input(s): AST, ALT, ALKPHOS, BILITOT, PROT, ALBUMIN in the last 168 hours. No results for input(s): LIPASE, AMYLASE in the last 168 hours. No results for input(s): AMMONIA in the last 168 hours. Coagulation Profile: No results for input(s): INR, PROTIME in the last 168 hours. Cardiac Enzymes: No results for input(s): CKTOTAL, CKMB, CKMBINDEX, TROPONINI in the last 168 hours. BNP (last 3 results) No results for input(s): PROBNP in the last 8760 hours. HbA1C: No results for input(s): HGBA1C in the last 72 hours. CBG: No results for input(s): GLUCAP in the last 168 hours. Lipid Profile: No results for input(s): CHOL, HDL, LDLCALC, TRIG, CHOLHDL, LDLDIRECT in the last 72 hours. Thyroid Function Tests: No results for input(s): TSH, T4TOTAL, FREET4,  T3FREE, THYROIDAB in the last 72 hours. Anemia Panel: No results for input(s): VITAMINB12, FOLATE, FERRITIN, TIBC, IRON, RETICCTPCT in the last 72 hours. Urine analysis:    Component Value Date/Time   COLORURINE YELLOW 01/23/2016 2110   APPEARANCEUR CLEAR 01/23/2016 2110   LABSPEC 1.028 01/23/2016 2110   PHURINE 5.0 01/23/2016 2110   GLUCOSEU >1000 (A) 01/23/2016 2110   HGBUR TRACE (A) 01/23/2016 2110   BILIRUBINUR NEGATIVE 01/23/2016 2110   [place] NEGATIVE 01/23/2016 2110   PROTEINUR 100 (A) 01/23/2016 2110   NITRITE NEGATIVE 01/23/2016 2110   LEUKOCYTESUR NEGATIVE 01/23/2016 2110   Sepsis Labs: @LABRCNTIP (procalcitonin:4,lacticidven:4) )No results found for this or any previous visit (from the past 240 hour(s)).   Radiological Exams on Admission: Dg Foot Complete Left  Result Date: 03/15/2016 CLINICAL DATA:  Pending great toe amputation tomorrow. History of diabetes. EXAM: LEFT FOOT - COMPLETE 3+ VIEW COMPARISON:  LEFT foot radiograph February 18, 2016 FINDINGS: Re- demonstration bony reabsorption tuft of first distal phalanx. No  acute fracture deformity or dislocation. Mild first MTP osteoarthrosis. Moderate plantar calcaneal spur. No bony reabsorption of the fifth distal phalanx on today's radiograph. Great toe soft tissue irregularity without subcutaneous gas or radiopaque foreign bodies. Soft tissue irregularities second distal phalanx. IMPRESSION: First distal phalanx osteomyelitis with soft tissue ulceration. Probable soft tissue ulcer second distal phalanx. Electronically Signed   By: Elon Alas M.D.   On: 03/15/2016 21:19    EKG: Independently reviewed.  Assessment/Plan Principal Problem:   Osteomyelitis of toe of left foot (HCC) Active Problems:   Diabetes mellitus without complication ([place])    1. Osteo of toe of left foot - 1. NPO after midnight, see Dr. Leigh Aurora note 2. Call [name] in AM 3. Presumably to OR tomorrow for amputation 4. Diabetic  foot ulcer pathway 5. Rocephin, flagyl, vanc per pathway, de-escalate post-op 6. NPO after midnight 2. DM2 - 1. Reduce lantus to 15 BID (takes 20 BID at home) 2. SSI q4h sensitive scale while NPO 3. HTN - continue coreg, will hold lisinopril / HCTZ   DVT prophylaxis: SCDs Code Status: Full Family Communication: No family in room Consults called: None Admission status: Admit to inpatient   Etta Quill DO [facility] Pager [phone] from 7PM-7AM  If 7AM-7PM, please contact the day physician for the patient [email or web address] Password [facility]  03/15/2016, 10:38 PM

## 2016-03-15 NOTE — ED Triage Notes (Signed)
Sent by Dr. Paulino Door from [place] to have left great to amputated. Seen in this ED last week for toe pain and received Tramadol which he sates "didn't help"

## 2016-03-15 NOTE — Care Management (Signed)
Patient was seen in tne ED tonight with complaint of foot pain. PMH DM, HTN, and Cellulitis. [employer] and is followed by [facility] in [place].  Patient was seen in the Podiatry office today by [name] for concerns of his left toe ulcer and for worsening pain. Patient was supposed to have toe amputated for osteomyelitis on 1/18 but was canceled until he could get medical clearance.  Patient evaluated by [name], Hospitalist was  consulted for possible hospital stay.

## 2016-03-15 NOTE — Progress Notes (Signed)
Pharmacy Antibiotic Note  Edras Pharell Rolfson. is a 46 y.o. male admitted on 03/15/2016 with diabetic foot infection.  Pharmacy has been consulted for vancomycin dosing. Pt is afebrile and WBC is WNL. Scr is elevated at 1.53 and lactic acid is <2.   Plan: Vanc 2gm IV x 1 then 750mg  IV Q12H F/u renal fxn, C&S, clinical status and trough at SS  Height: 6\' 3"  (190.5 cm) Weight: 228 lb (103.4 kg) IBW/kg (Calculated) : 84.5  Temp (24hrs), Avg:98.3 F (36.8 C), Min:97.7 F (36.5 C), Max:98.8 F (37.1 C)   Recent Labs Lab 03/15/16 2022 03/15/16 2034  WBC 7.9  --   CREATININE 1.53*  --   LATICACIDVEN  --  1.74    Estimated Creatinine Clearance: 76.9 mL/min (by C-G formula based on SCr of 1.53 mg/dL (H)).    Allergies  Allergen Reactions  . Levaquin [Levofloxacin] Swelling  . Phenergan [Promethazine Hcl] Diarrhea    Antimicrobials this admission: Vanc 1/22>> CTX 1/22>> Flagyl 1/22>>  Dose adjustments this admission: N/A  Microbiology results: Pending  Thank you for allowing pharmacy to be a part of this patient's care.  Mathew Postiglione, Rande Lawman 03/15/2016 10:16 PM
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
## 2016-03-16 ENCOUNTER — Ambulatory Visit: Payer: BC Managed Care – PPO | Admitting: Internal Medicine

## 2016-03-16 ENCOUNTER — Encounter: Payer: Self-pay | Admitting: Podiatry

## 2016-03-16 ENCOUNTER — Inpatient Hospital Stay (HOSPITAL_COMMUNITY): Payer: BC Managed Care – PPO | Admitting: Anesthesiology

## 2016-03-16 ENCOUNTER — Telehealth: Payer: Self-pay | Admitting: *Deleted

## 2016-03-16 ENCOUNTER — Encounter (HOSPITAL_COMMUNITY)
Admission: EM | Disposition: A | Discharge status: Home Health | Payer: Self-pay | Source: Home / Self Care | Attending: Internal Medicine

## 2016-03-16 ENCOUNTER — Inpatient Hospital Stay (HOSPITAL_COMMUNITY)
Admit: 2016-03-16 | Discharge: 2016-03-16 | Disposition: A | Discharge status: Home / Self Care | Payer: BC Managed Care – PPO | Attending: Internal Medicine | Admitting: Internal Medicine

## 2016-03-16 ENCOUNTER — Encounter (HOSPITAL_COMMUNITY): Payer: Self-pay | Admitting: Anesthesiology

## 2016-03-16 ENCOUNTER — Inpatient Hospital Stay (HOSPITAL_COMMUNITY): Payer: BC Managed Care – PPO

## 2016-03-16 DIAGNOSIS — M86679 Other chronic osteomyelitis, unspecified ankle and foot: Secondary | ICD-10-CM

## 2016-03-16 DIAGNOSIS — M869 Osteomyelitis, unspecified: Secondary | ICD-10-CM

## 2016-03-16 DIAGNOSIS — Z01810 Encounter for preprocedural cardiovascular examination: Secondary | ICD-10-CM

## 2016-03-16 HISTORY — PX: AMPUTATION: SHX166

## 2016-03-16 LAB — CBG MONITORING, ED
GLUCOSE-CAPILLARY: 286 mg/dL — AB (ref 65–99)
GLUCOSE-CAPILLARY: 230 mg/dL — AB (ref 65–99)
Glucose-Capillary: 180 mg/dL — ABNORMAL HIGH (ref 65–99)
Glucose-Capillary: 299 mg/dL — ABNORMAL HIGH (ref 65–99)

## 2016-03-16 LAB — GLUCOSE, CAPILLARY
GLUCOSE-CAPILLARY: 232 mg/dL — AB (ref 65–99)
Glucose-Capillary: 134 mg/dL — ABNORMAL HIGH (ref 65–99)

## 2016-03-16 LAB — MRSA PCR SCREENING: MRSA by PCR: NEGATIVE

## 2016-03-16 SURGERY — AMPUTATION DIGIT
Anesthesia: General | Site: Leg Lower | Laterality: Left

## 2016-03-16 MED ORDER — PHENYLEPHRINE HCL 10 MG/ML IJ SOLN
INTRAMUSCULAR | Status: DC | PRN
  Administered 2016-03-16: 120 ug via INTRAVENOUS
  Administered 2016-03-16: 80 ug via INTRAVENOUS
  Administered 2016-03-16: 160 ug via INTRAVENOUS
  Administered 2016-03-16: 80 ug via INTRAVENOUS
  Administered 2016-03-16: 120 ug via INTRAVENOUS
  Administered 2016-03-16: 80 ug via INTRAVENOUS
  Administered 2016-03-16: 160 ug via INTRAVENOUS

## 2016-03-16 MED ORDER — HYDROMORPHONE HCL 1 MG/ML IJ SOLN
0.2500 mg | INTRAMUSCULAR | Status: DC | PRN
  Administered 2016-03-16: 0.5 mg via INTRAVENOUS

## 2016-03-16 MED ORDER — ONDANSETRON HCL 4 MG/2ML IJ SOLN
4.0000 mg | Freq: Four times a day (QID) | INTRAMUSCULAR | Status: DC | PRN
  Administered 2016-03-16 – 2016-03-17 (×2): 4 mg via INTRAVENOUS
  Filled 2016-03-16: qty 2

## 2016-03-16 MED ORDER — ONDANSETRON HCL 4 MG/2ML IJ SOLN
INTRAMUSCULAR | Status: DC | PRN
  Administered 2016-03-16: 4 mg via INTRAVENOUS

## 2016-03-16 MED ORDER — LACTATED RINGERS IV SOLN
INTRAVENOUS | Status: DC | PRN
  Administered 2016-03-16: 16:00:00 via INTRAVENOUS

## 2016-03-16 MED ORDER — BUPIVACAINE HCL (PF) 0.5 % IJ SOLN
INTRAMUSCULAR | Status: DC | PRN
  Administered 2016-03-16: 5 mL

## 2016-03-16 MED ORDER — HYDROMORPHONE HCL 1 MG/ML IJ SOLN
INTRAMUSCULAR | Status: AC
Start: 2016-03-16 — End: 2016-03-17
  Filled 2016-03-16: qty 1

## 2016-03-16 MED ORDER — EPHEDRINE SULFATE 50 MG/ML IJ SOLN
INTRAMUSCULAR | Status: DC | PRN
  Administered 2016-03-16: 5 mg via INTRAVENOUS

## 2016-03-16 MED ORDER — LIDOCAINE HCL (CARDIAC) 20 MG/ML IV SOLN
INTRAVENOUS | Status: DC | PRN
  Administered 2016-03-16: 100 mg via INTRAVENOUS

## 2016-03-16 MED ORDER — OXYCODONE-ACETAMINOPHEN 5-325 MG PO TABS
ORAL_TABLET | ORAL | Status: AC
  Administered 2016-03-16: 1
  Filled 2016-03-16: qty 1

## 2016-03-16 MED ORDER — MIDAZOLAM HCL 5 MG/5ML IJ SOLN
INTRAMUSCULAR | Status: DC | PRN
  Administered 2016-03-16: 2 mg via INTRAVENOUS

## 2016-03-16 MED ORDER — 0.9 % SODIUM CHLORIDE (POUR BTL) OPTIME
TOPICAL | Status: DC | PRN
  Administered 2016-03-16: 1000 mL

## 2016-03-16 MED ORDER — HEPARIN SODIUM (PORCINE) 5000 UNIT/ML IJ SOLN
5000.0000 [IU] | Freq: Three times a day (TID) | INTRAMUSCULAR | Status: DC
  Administered 2016-03-16 – 2016-03-17 (×2): 5000 [IU] via SUBCUTANEOUS
  Filled 2016-03-16 (×2): qty 1

## 2016-03-16 MED ORDER — LIDOCAINE HCL 2 % IJ SOLN
INTRAMUSCULAR | Status: DC | PRN
  Administered 2016-03-16: 5 mL

## 2016-03-16 MED ORDER — FENTANYL CITRATE (PF) 100 MCG/2ML IJ SOLN
INTRAMUSCULAR | Status: DC | PRN
  Administered 2016-03-16: 50 ug via INTRAVENOUS

## 2016-03-16 MED ORDER — PROPOFOL 10 MG/ML IV BOLUS
INTRAVENOUS | Status: DC | PRN
  Administered 2016-03-16: 200 mg via INTRAVENOUS

## 2016-03-16 MED ORDER — ONDANSETRON HCL 4 MG/2ML IJ SOLN: 4.0000 mg | Freq: Once | INTRAMUSCULAR | Status: DC | PRN

## 2016-03-16 SURGICAL SUPPLY — 37 items
BANDAGE ACE 3X5.8 VEL STRL LF (GAUZE/BANDAGES/DRESSINGS) ×3 IMPLANT
BANDAGE ELASTIC 4 VELCRO ST LF (GAUZE/BANDAGES/DRESSINGS) ×3 IMPLANT
BLADE LONG MED 31MMX9MM (MISCELLANEOUS)
BLADE LONG MED 31X9 (MISCELLANEOUS) IMPLANT
BNDG CONFORM 2 STRL LF (GAUZE/BANDAGES/DRESSINGS) ×3 IMPLANT
BNDG ESMARK 4X9 LF (GAUZE/BANDAGES/DRESSINGS) ×3 IMPLANT
BNDG GAUZE ELAST 4 BULKY (GAUZE/BANDAGES/DRESSINGS) ×3 IMPLANT
CUFF TOURNIQUET SINGLE 18IN (TOURNIQUET CUFF) ×3 IMPLANT
DRSG ADAPTIC 3X8 NADH LF (GAUZE/BANDAGES/DRESSINGS) ×3 IMPLANT
DRSG EMULSION OIL 3X3 NADH (GAUZE/BANDAGES/DRESSINGS) ×3 IMPLANT
DURAPREP 26ML APPLICATOR (WOUND CARE) ×3 IMPLANT
ELECT REM PT RETURN 9FT ADLT (ELECTROSURGICAL) ×3
ELECTRODE REM PT RTRN 9FT ADLT (ELECTROSURGICAL) ×1 IMPLANT
GAUZE SPONGE 4X4 12PLY STRL (GAUZE/BANDAGES/DRESSINGS) ×3 IMPLANT
GLOVE BIO SURGEON STRL SZ7.5 (GLOVE) ×6 IMPLANT
GLOVE BIOGEL PI IND STRL 7.5 (GLOVE) ×1 IMPLANT
GLOVE BIOGEL PI INDICATOR 7.5 (GLOVE) ×2
GOWN STRL REUS W/ TWL LRG LVL3 (GOWN DISPOSABLE) ×1 IMPLANT
GOWN STRL REUS W/ TWL XL LVL3 (GOWN DISPOSABLE) ×1 IMPLANT
GOWN STRL REUS W/TWL LRG LVL3 (GOWN DISPOSABLE) ×2
GOWN STRL REUS W/TWL XL LVL3 (GOWN DISPOSABLE) ×2
KIT BASIN OR (CUSTOM PROCEDURE TRAY) ×3 IMPLANT
NDL SAFETY ECLIPSE 18X1.5 (NEEDLE) IMPLANT
NEEDLE HYPO 18GX1.5 SHARP (NEEDLE)
NEEDLE HYPO 25X1 1.5 SAFETY (NEEDLE) ×3 IMPLANT
NS IRRIG 1000ML POUR BTL (IV SOLUTION) IMPLANT
PACK ORTHO EXTREMITY (CUSTOM PROCEDURE TRAY) IMPLANT
PADDING CAST ABS 4INX4YD NS (CAST SUPPLIES) ×2
PADDING CAST ABS COTTON 4X4 ST (CAST SUPPLIES) ×1 IMPLANT
SPONGE GAUZE 4X4 12PLY STER LF (GAUZE/BANDAGES/DRESSINGS) ×3 IMPLANT
SUT MNCRL AB 3-0 PS2 18 (SUTURE) ×3 IMPLANT
SUT MNCRL AB 4-0 PS2 18 (SUTURE) IMPLANT
SUT MON AB 5-0 PS2 18 (SUTURE) ×3 IMPLANT
SUT PROLENE 3 0 PS 1 (SUTURE) ×9 IMPLANT
SUT PROLENE 4 0 PS 2 18 (SUTURE) ×3 IMPLANT
SYRINGE 10CC LL (SYRINGE) IMPLANT
UNDERPAD 30X30 (UNDERPADS AND DIAPERS) ×3 IMPLANT
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
## 2016-03-16 NOTE — ED Notes (Signed)
Pt states he is comfortable at this time. Denies need for any pain medication.

## 2016-03-16 NOTE — Progress Notes (Signed)
Orthopedic Tech Progress Note Patient Details:  [name]. Jun 07, 1970 [id number]  Ortho Devices Type of Ortho Device: CAM walker Ortho Device/Splint Location: LLE Ortho Device/Splint Interventions: Ordered, Application   [name] 03/16/2016, 5:35 PM

## 2016-03-16 NOTE — ED Notes (Signed)
Patient is here for left foot pain.  Patient is a diabetic and has been trying to have the left great toe amputated for some time now.  Cannot have surgery with blood sugars that are not in normal ranges.  On 4hr glucose checks and insulin coverage.  Next check is at 8am.  Sugars are trending downward.  Given ABX for wound on left foot.  WBC and lactic WNL.  Patient is A&Ox4 pending Medical surgical observation bed.

## 2016-03-16 NOTE — Transfer of Care (Signed)
Immediate Anesthesia Transfer of Care Note  Patient: [name].  Procedure(s) Performed: Procedure(s) with comments: AMPUTATION DIGIT LEFT HALLUX (Left) - can start around 5   Patient Location: PACU  Anesthesia Type:General  Level of Consciousness: awake, alert , oriented and patient cooperative  Airway & Oxygen Therapy: Patient Spontanous Breathing and Patient connected to face mask oxygen  Post-op Assessment: Report given to RN and Post -op Vital signs reviewed and stable  Post vital signs: Reviewed and stable  Last Vitals:  Vitals:   03/16/16 1221 03/16/16 1655  BP: 146/100   Pulse: 85   Resp: 15   Temp:  (P) 36.5 C    Last Pain:  Vitals:   03/16/16 0813  TempSrc: Oral  PainSc:          Complications: No apparent anesthesia complications

## 2016-03-16 NOTE — Consult Note (Signed)
WOC consult requested for left foot wound.  Pt is followed by Dr Jacqualyn Posey of the podiatry service and progress notes indicate he plans to take the patient to surgery today.  Please refer to their service for further assessment and plan of care. Please re-consult if further assistance is needed.  Thank-you,  Julien Girt MSN, [place], [place], [place], [place]

## 2016-03-16 NOTE — Telephone Encounter (Signed)
[name] dtr, [name] states pt has been in the ER at [facility] all night and they don't have him a room. I spoke with [place] and she said he was to have surgery with Dr. Paulla Dolly between 3:00pm and 5:00pm. I spoke with D. [place] - Surgery coordinator and she states pt is to have surgery with [name] at 4:00pm and was to be in patient. I spoke with [place] and she said they were getting pt back and I told her to tell them he was to have surgery and be admitted and they would help her get her father to the OR and admitting.
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
## 2016-03-16 NOTE — Progress Notes (Signed)
Status post left hallux amputation. Sent the toe for pathology as well as clean margin of the 1st metatarsal. Wound culture obtained. Wound closed without tension.   WBAT in CAM boot (limited)  Can be discharged home from a surgical standpoint in the morning. Would discharge home with Augmentin pending cultures and I will follow as outpatient.   Celesta Gentile, DPM Office: [phone] Cell: [phone]

## 2016-03-16 NOTE — Care Management Note (Signed)
Case Management Note  Patient Details  Name: [name]. MRN: [id number] Date of Birth: November 30, 1970  Subjective/Objective:                  From home with spouse. /46 year old male presents to the office today for concerns of his left toe ulcer and for worsening pain.  Action/Plan: Admit status INPATIENT (L great toe osteomyelitis and need for amputation); anticipate discharge [place].   Expected Discharge Date:   (unsure)               Expected Discharge Plan:  [place]  In-House Referral:  NA  Discharge planning Services  CM Consult  Post Acute Care Choice:    Choice offered to:     DME Arranged:    DME Agency:     HH Arranged:    HH Agency:     Status of Service:  In process, will continue to follow  If discussed at Long Length of Stay Meetings, dates discussed:    Additional Comments:  Fuller Mandril, RN 03/16/2016, 2:04 PM

## 2016-03-16 NOTE — Progress Notes (Signed)
PROGRESS NOTE    [name].  MOQ:[id number] DOB: [birth year] DOA: 03/15/2016 PCP: Inc [facility]   Brief Narrative:   Patient admitted for worsening of his foot pain. Found to have osteomyelitis. He is currently on Abx with plans of amputation per Podiatry   Assessment & Plan:   Principal Problem:   Osteomyelitis of toe of left foot (HCC) Active Problems:   Diabetes mellitus without complication ([place])   Essential hypertension  Osteomyelitis of the left foot  -he is to go for amputation per Dr Jacqualyn Posey (discussed with him this morning) -pain control  -IVF -cont Abx at this time as prescribed. If spikes fevers, it may be changed to cefepime.   Dm2 -ISS and accuchekcs for now  -lantus 15units bid    HTN  - continue coreg, will hold lisinopril / HCTZ  He can have diabetic/cardiac diet after his surgery.     DVT prophylaxis:  Hep sq Code Status: Full  Family Communiction:  Patient can understand his condition well  Disposition Plan: OR today per podiatry for amputation   Consultants:   Podiatry   Procedures:   None   Antimicrobials:   Vanc, rocephin and flagyl    Subjective: Patient doesn't have any complaints at this times. He remains afebrile.   Objective: Vitals:   03/15/16 1502 03/15/16 2007 03/16/16 0813 03/16/16 1221  BP:  131/78 139/89 146/100  Pulse:  82 83 85  Resp:  16 20 15   Temp:  97.7 F (36.5 C) 98.1 F (36.7 C)   TempSrc:  Oral Oral   SpO2:  100% 98% 99%  Weight: 228 lb (103.4 kg)     Height: 6\' 3"  (1.905 m)       Intake/Output Summary (Last 24 hours) at 03/16/16 1531 Last data filed at 03/16/16 1145  Gross per 24 hour  Intake                0 ml  Output             1850 ml  Net            -1850 ml   Filed Weights   03/15/16 1502  Weight: 228 lb (103.4 kg)    Examination:  General exam: Appears calm and comfortable  Respiratory system: Clear to auscultation. Respiratory effort  normal. Cardiovascular system: S1 & S2 heard, RRR. No JVD, murmurs, rubs, gallops or clicks. No pedal edema. Gastrointestinal system: Abdomen is nondistended, soft and nontender. No organomegaly or masses felt. Normal bowel sounds heard. Central nervous system: Alert and oriented. No focal neurological deficits. Extremities: Symmetric 5 x 5 power. Left toe dressing in place  Skin: No rashes, lesions or ulcers Psychiatry: Judgement and insight appear normal. Mood & affect appropriate.     Data Reviewed:   CBC:  Recent Labs Lab 03/15/16 2022  WBC 7.9  NEUTROABS 3.9  HGB 11.6*  HCT 36.0*  MCV 70.7*  PLT 354   Basic Metabolic Panel:  Recent Labs Lab 03/15/16 2022  NA 134*  K 4.1  CL 99*  CO2 24  GLUCOSE 355*  BUN 32*  CREATININE 1.53*  CALCIUM 8.8*   GFR: Estimated Creatinine Clearance: 76.9 mL/min (by C-G formula based on SCr of 1.53 mg/dL (H)). Liver Function Tests: No results for input(s): AST, ALT, ALKPHOS, BILITOT, PROT, ALBUMIN in the last 168 hours. No results for input(s): LIPASE, AMYLASE in the last 168 hours. No results for input(s): AMMONIA in the last 168  hours. Coagulation Profile: No results for input(s): INR, PROTIME in the last 168 hours. Cardiac Enzymes: No results for input(s): CKTOTAL, CKMB, CKMBINDEX, TROPONINI in the last 168 hours. BNP (last 3 results) No results for input(s): PROBNP in the last 8760 hours. HbA1C: No results for input(s): HGBA1C in the last 72 hours. CBG:  Recent Labs Lab 03/16/16 0047 03/16/16 0358 03/16/16 0744 03/16/16 1132  GLUCAP 299* 286* 230* 180*   Lipid Profile: No results for input(s): CHOL, HDL, LDLCALC, TRIG, CHOLHDL, LDLDIRECT in the last 72 hours. Thyroid Function Tests: No results for input(s): TSH, T4TOTAL, FREET4, T3FREE, THYROIDAB in the last 72 hours. Anemia Panel: No results for input(s): VITAMINB12, FOLATE, FERRITIN, TIBC, IRON, RETICCTPCT in the last 72 hours. Sepsis Labs:  Recent Labs Lab  03/15/16 2034  LATICACIDVEN 1.74    No results found for this or any previous visit (from the past 240 hour(s)).       Radiology Studies: Dg Foot Complete Left  Result Date: 03/15/2016 CLINICAL DATA:  Pending great toe amputation tomorrow. History of diabetes. EXAM: LEFT FOOT - COMPLETE 3+ VIEW COMPARISON:  LEFT foot radiograph February 18, 2016 FINDINGS: Re- demonstration bony reabsorption tuft of first distal phalanx. No acute fracture deformity or dislocation. Mild first MTP osteoarthrosis. Moderate plantar calcaneal spur. No bony reabsorption of the fifth distal phalanx on today's radiograph. Great toe soft tissue irregularity without subcutaneous gas or radiopaque foreign bodies. Soft tissue irregularities second distal phalanx. IMPRESSION: First distal phalanx osteomyelitis with soft tissue ulceration. Probable soft tissue ulcer second distal phalanx. Electronically Signed   By: Elon Alas M.D.   On: 03/15/2016 21:19        Scheduled Meds: . [MAR Hold] carvedilol  6.25 mg Oral BID WC  . [MAR Hold] cefTRIAXone (ROCEPHIN)  IV  2 g Intravenous Q24H   And  . [MAR Hold] metroNIDAZOLE  500 mg Oral Q8H  . [MAR Hold] insulin aspart  0-9 Units Subcutaneous Q4H  . [MAR Hold] insulin glargine  15 Units Subcutaneous BID  . [MAR Hold] tamsulosin  0.4 mg Oral q morning - 10a  . [MAR Hold] vancomycin  750 mg Intravenous Q12H   Continuous Infusions:   LOS: 1 day    Time spent: 35 mins     Ankit Arsenio Loader, MD [facility] Pager [phone]   If 7PM-7AM, please contact night-coverage [email or web address] Password [email or web address] 03/16/2016, 3:31 PM

## 2016-03-16 NOTE — ED Notes (Signed)
Patient CBG was 180.

## 2016-03-16 NOTE — Anesthesia Postprocedure Evaluation (Signed)
Anesthesia Post Note  Patient: [name].  Procedure(s) Performed: Procedure(s) (LRB): AMPUTATION DIGIT LEFT HALLUX (Left)  Patient location during evaluation: PACU Anesthesia Type: General Level of consciousness: awake and alert and oriented Pain management: pain level controlled Vital Signs Assessment: post-procedure vital signs reviewed and stable Respiratory status: spontaneous breathing, nonlabored ventilation and respiratory function stable Cardiovascular status: blood pressure returned to baseline and stable Postop Assessment: no signs of nausea or vomiting Anesthetic complications: no       Last Vitals:  Vitals:   03/16/16 1715 03/16/16 1724  BP:    Pulse: 81 80  Resp: 14 11  Temp:      Last Pain:  Vitals:   03/16/16 1715  TempSrc:   PainSc: 2                  Sirus Labrie A.

## 2016-03-16 NOTE — Progress Notes (Signed)
CSW consult acknowledged re: "access meds at discharge". RN Case Manager notified and to assist with medication assistance. Patient has Nurse, mental health and is followed by [facility] in [place]. CSW signing off. Please consult should new need(s) arise.    Lorrine Kin, MSW, LCSW [facility] ED/41M Clinical Social Worker [phone]

## 2016-03-16 NOTE — ED Notes (Signed)
Pt transported to OR. Family present with him

## 2016-03-16 NOTE — ED Notes (Signed)
Redressed pt's toe with gauze. Informed pt that OR still does not have confirmed time for procedure, but will be an add on. OR will inform RN when pt has time. Pt agreeable.

## 2016-03-16 NOTE — Telephone Encounter (Signed)
The patient has been admitted and I am about to do surgery. I discussed this with Mr. Salguero before he even came to [facility] that the hospital was at 100% full and the ER was full and there was no timeframe of when he would get a bed. He was offered to go to [facility] but he chose to come to [place] understanding this. I understand it is frustrating but there is not much I can do to get him a bed.

## 2016-03-16 NOTE — ED Notes (Signed)
Attempted report to OR holding. Will try again.

## 2016-03-16 NOTE — Progress Notes (Signed)
VASCULAR LAB PRELIMINARY  ARTERIAL  ABI completed: Right ABI of 1.23 and left ABI of 1.21 are suggestive of arterial flow within normal limits at rest.   RIGHT    LEFT    PRESSURE WAVEFORM  PRESSURE WAVEFORM  BRACHIAL 161 Triphasic BRACHIAL 158 Triphasic  DP 182 Triphasic DP 195 Triphasic  PT 198 Triphasic PT 184 Triphasic    RIGHT LEFT  ABI 1.23 1.21     Legrand Como, RVT 03/16/2016, 12:17 PM

## 2016-03-16 NOTE — Anesthesia Preprocedure Evaluation (Signed)
Anesthesia Evaluation  Patient identified by MRN, date of birth, ID band Patient awake    Reviewed: Allergy & Precautions, NPO status , Patient's Chart, lab work & pertinent test results, reviewed documented beta blocker date and time   History of Anesthesia Complications Negative for: history of anesthetic complications  Airway Mallampati: III  TM Distance: >3 FB Neck ROM: Full    Dental  (+) Poor Dentition   Pulmonary neg pulmonary ROS, neg sleep apnea, neg COPD,    breath sounds clear to auscultation- rhonchi (-) wheezing      Cardiovascular Exercise Tolerance: Good hypertension, Pt. on medications and Pt. on home beta blockers + Peripheral Vascular Disease  (-) CAD and (-) Past MI  Rhythm:Regular Rate:Normal - Systolic murmurs and - Diastolic murmurs    Neuro/Psych negative neurological ROS  negative psych ROS   GI/Hepatic negative GI ROS, Neg liver ROS,   Endo/Other  diabetes, Poorly Controlled, Type 2, Insulin Dependent, Oral Hypoglycemic AgentsHyperlipidemia  Renal/GU Renal disease     Musculoskeletal negative musculoskeletal ROS (+)   Abdominal (+) - obese,   Peds  Hematology negative hematology ROS (+)   Anesthesia Other Findings P  Reproductive/Obstetrics                            Anesthesia Physical  Anesthesia Plan  ASA: III  Anesthesia Plan: General   Post-op Pain Management:    Induction: Intravenous  Airway Management Planned: LMA  Additional Equipment:   Intra-op Plan:   Post-operative Plan: Extubation in OR  Informed Consent: I have reviewed the patients History and Physical, chart, labs and discussed the procedure including the risks, benefits and alternatives for the proposed anesthesia with the patient or authorized representative who has indicated his/her understanding and acceptance.   Dental advisory given  Plan Discussed with: CRNA,  Anesthesiologist and Surgeon  Anesthesia Plan Comments:         Anesthesia Quick Evaluation

## 2016-03-16 NOTE — ED Notes (Signed)
No Diet was ordered for patient at this time, Pt. Is NPO.

## 2016-03-16 NOTE — ED Notes (Signed)
Obtained consent for pt's procedure. OR called RN that they are ready for pt. Will inform pt.

## 2016-03-16 NOTE — ED Notes (Signed)
Patient CBG was 230.

## 2016-03-16 NOTE — Anesthesia Procedure Notes (Signed)
Procedure Name: LMA Insertion Date/Time: 03/16/2016 4:13 PM Performed by: Shirlyn Goltz Pre-anesthesia Checklist: Patient identified, Emergency Drugs available, Suction available and Patient being monitored Patient Re-evaluated:Patient Re-evaluated prior to inductionOxygen Delivery Method: Circle system utilized Preoxygenation: Pre-oxygenation with 100% oxygen Intubation Type: IV induction Ventilation: Mask ventilation without difficulty LMA: LMA inserted LMA Size: 5.0 Number of attempts: 1 Placement Confirmation: positive ETCO2 and breath sounds checked- equal and bilateral Tube secured with: Tape Dental Injury: Teeth and Oropharynx as per pre-operative assessment

## 2016-03-17 ENCOUNTER — Encounter (HOSPITAL_COMMUNITY): Payer: Self-pay | Admitting: Podiatry

## 2016-03-17 LAB — GLUCOSE, CAPILLARY
Glucose-Capillary: 146 mg/dL — ABNORMAL HIGH (ref 65–99)
Glucose-Capillary: 172 mg/dL — ABNORMAL HIGH (ref 65–99)
Glucose-Capillary: 187 mg/dL — ABNORMAL HIGH (ref 65–99)
Glucose-Capillary: 226 mg/dL — ABNORMAL HIGH (ref 65–99)

## 2016-03-17 MED ORDER — ONDANSETRON 4 MG PO TBDP: 4.0000 mg | ORAL_TABLET | Freq: Three times a day (TID) | ORAL | 0 refills | Status: DC | PRN

## 2016-03-17 MED ORDER — INFLUENZA VAC SPLIT QUAD 0.5 ML IM SUSY
0.5000 mL | PREFILLED_SYRINGE | INTRAMUSCULAR | Status: AC
  Administered 2016-03-17: 0.5 mL via INTRAMUSCULAR

## 2016-03-17 MED ORDER — AMOXICILLIN-POT CLAVULANATE 875-125 MG PO TABS: 1.0000 | ORAL_TABLET | Freq: Two times a day (BID) | ORAL | 0 refills | Status: DC

## 2016-03-17 MED ORDER — ONDANSETRON HCL 4 MG/2ML IJ SOLN
4.0000 mg | Freq: Four times a day (QID) | INTRAMUSCULAR | Status: DC | PRN
  Administered 2016-03-17: 4 mg via INTRAVENOUS
  Filled 2016-03-17: qty 2

## 2016-03-17 MED ORDER — HYDROCODONE-ACETAMINOPHEN 5-325 MG PO TABS: 1.0000 | ORAL_TABLET | Freq: Two times a day (BID) | ORAL | 0 refills | Status: DC | PRN

## 2016-03-17 NOTE — Progress Notes (Signed)
PROGRESS NOTE    [name].  YTK:[id number] DOB: 26-May-1970 DOA: 03/15/2016 PCP: [facility]   Brief Narrative:   Patient admitted for worsening of his foot pain. Found to have osteomyelitis. He is currently on Abx. S/p Left hallux ampuation POD#1  Assessment & Plan:   Principal Problem:   Osteomyelitis of toe of left foot (HCC) Active Problems:   Diabetes mellitus without complication (HCC)   Essential hypertension  Osteomyelitis of the left foot s/p Left Halux amputation 03/16/16 -per Dr Jacqualyn Posey- will discharge him on Augment and he will follow up cultures outpatient.  -pain control    Dm2 -ISS and accuchekcs for now  -lantus 15units bid   HTN  - cont home meds   He can have diabetic/cardiac diet after his surgery.     DVT prophylaxis:  Hep sq Code Status: Full  Family Communiction:  Patient can understand his condition well  Disposition Plan: Discharge home today   Consultants:   Podiatry   Procedures:   None   Antimicrobials:   Vanc, rocephin and flagyl    Subjective: No complaints her patient today besides surgical site pain. Tolerated surgery well and is eager to go home.   Objective: Vitals:   03/16/16 1842 03/16/16 2009 03/17/16 0401 03/17/16 0636  BP: (!) 157/86 124/76 121/67 (!) 146/99  Pulse: 84 91 92 92  Resp: 16 19 19 18   Temp: 97.9 F (36.6 C) 98.1 F (36.7 C) 98.1 F (36.7 C)   TempSrc: Oral Oral Oral   SpO2: 98% 98% 98% 98%  Weight: 111.3 kg (245 lb 6 oz)     Height: 6\' 2"  (1.88 m)       Intake/Output Summary (Last 24 hours) at 03/17/16 1006 Last data filed at 03/17/16 0700  Gross per 24 hour  Intake             1600 ml  Output             2000 ml  Net             -400 ml   Filed Weights   03/15/16 1502 03/16/16 1842  Weight: 103.4 kg (228 lb) 111.3 kg (245 lb 6 oz)    Examination:  General exam: Appears calm and comfortable  Respiratory system: Clear to auscultation. Respiratory  effort normal. Cardiovascular system: S1 & S2 heard, RRR. No JVD, murmurs, rubs, gallops or clicks. No pedal edema. Gastrointestinal system: Abdomen is nondistended, soft and nontender. No organomegaly or masses felt. Normal bowel sounds heard. Central nervous system: Alert and oriented. No focal neurological deficits. Extremities: Symmetric 5 x 5 power. Left toe dressing in place  Skin: No rashes, lesions or ulcers Psychiatry: Judgement and insight appear normal. Mood & affect appropriate.     Data Reviewed:   CBC:  Recent Labs Lab 03/15/16 2022  WBC 7.9  NEUTROABS 3.9  HGB 11.6*  HCT 36.0*  MCV 70.7*  PLT 557   Basic Metabolic Panel:  Recent Labs Lab 03/15/16 2022  NA 134*  K 4.1  CL 99*  CO2 24  GLUCOSE 355*  BUN 32*  CREATININE 1.53*  CALCIUM 8.8*   GFR: Estimated Creatinine Clearance: 78.3 mL/min (by C-G formula based on SCr of 1.53 mg/dL (H)). Liver Function Tests: No results for input(s): AST, ALT, ALKPHOS, BILITOT, PROT, ALBUMIN in the last 168 hours. No results for input(s): LIPASE, AMYLASE in the last 168 hours. No results for input(s): AMMONIA in the last 168  hours. Coagulation Profile: No results for input(s): INR, PROTIME in the last 168 hours. Cardiac Enzymes: No results for input(s): CKTOTAL, CKMB, CKMBINDEX, TROPONINI in the last 168 hours. BNP (last 3 results) No results for input(s): PROBNP in the last 8760 hours. HbA1C: No results for input(s): HGBA1C in the last 72 hours. CBG:  Recent Labs Lab 03/16/16 1657 03/16/16 2009 03/17/16 0017 03/17/16 0400 03/17/16 0753  GLUCAP 134* 232* 226* 172* 146*   Lipid Profile: No results for input(s): CHOL, HDL, LDLCALC, TRIG, CHOLHDL, LDLDIRECT in the last 72 hours. Thyroid Function Tests: No results for input(s): TSH, T4TOTAL, FREET4, T3FREE, THYROIDAB in the last 72 hours. Anemia Panel: No results for input(s): VITAMINB12, FOLATE, FERRITIN, TIBC, IRON, RETICCTPCT in the last 72 hours. Sepsis  Labs:  Recent Labs Lab 03/15/16 2034  LATICACIDVEN 1.74    Recent Results (from the past 240 hour(s))  Aerobic/Anaerobic Culture (surgical/deep wound)     Status: None (Preliminary result)   Collection Time: 03/16/16  4:22 PM  Result Value Ref Range Status   Specimen Description WOUND LEFT TOE  Final   Special Requests GREAT TOE  Final   Gram Stain   Final    RARE WBC PRESENT,BOTH PMN AND MONONUCLEAR NO ORGANISMS SEEN    Culture PENDING  Incomplete   Report Status PENDING  Incomplete  MRSA PCR Screening     Status: None   Collection Time: 03/16/16  6:57 PM  Result Value Ref Range Status   MRSA by PCR NEGATIVE NEGATIVE Final    Comment:        The GeneXpert MRSA Assay (FDA approved for NASAL specimens only), is one component of a comprehensive MRSA colonization surveillance program. It is not intended to diagnose MRSA infection nor to guide or monitor treatment for MRSA infections.          Radiology Studies: Dg Foot 2 Views Left  Result Date: 03/16/2016 CLINICAL DATA:  Status post first toe amputation EXAM: LEFT FOOT - 2 VIEW COMPARISON:  03/15/2016 FINDINGS: There is been interval amputation of the first toe. Air is noted in the surgical bed consistent with the recent surgery. No other areas of bony destruction are noted. No other focal abnormality is seen. IMPRESSION: Status post first toe amputation. Electronically Signed   By: Inez Catalina M.D.   On: 03/16/2016 17:35   Dg Foot Complete Left  Result Date: 03/15/2016 CLINICAL DATA:  Pending great toe amputation tomorrow. History of diabetes. EXAM: LEFT FOOT - COMPLETE 3+ VIEW COMPARISON:  LEFT foot radiograph February 18, 2016 FINDINGS: Re- demonstration bony reabsorption tuft of first distal phalanx. No acute fracture deformity or dislocation. Mild first MTP osteoarthrosis. Moderate plantar calcaneal spur. No bony reabsorption of the fifth distal phalanx on today's radiograph. Great toe soft tissue irregularity  without subcutaneous gas or radiopaque foreign bodies. Soft tissue irregularities second distal phalanx. IMPRESSION: First distal phalanx osteomyelitis with soft tissue ulceration. Probable soft tissue ulcer second distal phalanx. Electronically Signed   By: Elon Alas M.D.   On: 03/15/2016 21:19        Scheduled Meds: . carvedilol  6.25 mg Oral BID WC  . cefTRIAXone (ROCEPHIN)  IV  2 g Intravenous Q24H   And  . metroNIDAZOLE  500 mg Oral Q8H  . heparin  5,000 Units Subcutaneous Q8H  . [START ON 03/18/2016] Influenza vac split quadrivalent PF  0.5 mL Intramuscular Tomorrow-1000  . insulin aspart  0-9 Units Subcutaneous Q4H  . insulin glargine  15 Units Subcutaneous  BID  . tamsulosin  0.4 mg Oral q morning - 10a  . vancomycin  750 mg Intravenous Q12H   Continuous Infusions:   LOS: 2 days    Time spent: 35 mins     Ahtziri Jeffries Arsenio Loader, MD [facility] Pager [phone]   If 7PM-7AM, please contact night-coverage [email or web address] Password [name] 03/17/2016, 10:06 AM

## 2016-03-17 NOTE — Evaluation (Signed)
Physical Therapy Evaluation Patient Details Name: [name]. MRN: [id number] DOB: [birth year] Today's Date: 03/17/2016   History of Present Illness  [name]. is a 46 y.o. male with medical history significant of DM, HTN.  Patient sent in to ED by DPM with L great toe osteomyelitis and need for amputation.    Clinical Impression  Pt admitted with above diagnosis. Pt currently with functional limitations due to the deficits listed below (see PT Problem List). Pt was able to ambulate with RW with overall good safety but limited distance by nausea and pain.  Practiced steps as well.  Wife states they will go home today if MD allows as they can arrange help if needed.  Equipment needs as below.  Will follow until d/c.  Pt will benefit from skilled PT to increase their independence and safety with mobility to allow discharge to the venue listed below.      Follow Up Recommendations No PT follow up;Supervision/Assistance - 24 hour (Pt and wife decline HH f/u)    Equipment Recommendations  Rolling walker with 5" wheels;3in1 (PT) (needs tall equipment as pt is 6 feet 3 inches)    Recommendations for Other Services       Precautions / Restrictions Precautions Precautions: Fall Required Braces or Orthoses: Other Brace/Splint Other Brace/Splint: CAM walker Restrictions Weight Bearing Restrictions: Yes LLE Weight Bearing: Weight bearing as tolerated      Mobility  Bed Mobility Overal bed mobility: Independent                Transfers Overall transfer level: Needs assistance Equipment used: Rolling walker (2 wheeled) Transfers: Sit to/from Stand Sit to Stand: Min guard;Supervision         General transfer comment: no assist or cues needed.  Ambulation/Gait Ambulation/Gait assistance: Min guard Ambulation Distance (Feet): 30 Feet Assistive device: Rolling walker (2 wheeled) Gait Pattern/deviations: Step-to pattern;Decreased step length - left;Decreased  stance time - left;Decreased stride length;Decreased weight shift to left;Antalgic   Gait velocity interpretation: Below normal speed for age/gender General Gait Details: Pt was able to ambulate with RW with cues to sequence steps and RW.  Pt with episode of dizzines. Checked BP but it was fine.  Pt nurse then brought nausea meds and took pt to go up and down steps.    Stairs Stairs: Yes Stairs assistance: Min guard Stair Management: Step to pattern;Forwards;With walker Number of Stairs: 2 General stair comments: Pt and wife can demonstrated good technique of up and down steps with RW. Pt fatigued after 30 feet of ambulation and up and down steps.    Wheelchair Mobility    Modified Rankin (Stroke Patients Only)       Balance Overall balance assessment: Needs assistance Sitting-balance support: No upper extremity supported;Feet supported Sitting balance-Leahy Scale: Fair     Standing balance support: Bilateral upper extremity supported;During functional activity Standing balance-Leahy Scale: Poor Standing balance comment: relies on RW for support in standing.                              Pertinent Vitals/Pain Pain Assessment: 0-10 Pain Score: 5  Pain Location: left foot Pain Descriptors / Indicators: Aching;Grimacing;Guarding Pain Intervention(s): Limited activity within patient's tolerance;Monitored during session;Premedicated before session;Repositioned  BP 142/79  Home Living Family/patient expects to be discharged to:: Private residence Living Arrangements: Spouse/significant other Available Help at Discharge: Family;Available 24 hours/day (wife and mother in law) Type of Home: [place]  Access: Stairs to enter Entrance Stairs-Rails: None Entrance Stairs-Number of Steps: 2 Home Layout: One level Home Equipment: Crutches      Prior Function Level of Independence: Independent               Hand Dominance        Extremity/Trunk Assessment    Upper Extremity Assessment Upper Extremity Assessment: Defer to OT evaluation    Lower Extremity Assessment Lower Extremity Assessment: Generalized weakness    Cervical / Trunk Assessment Cervical / Trunk Assessment: Normal  Communication   Communication: No difficulties  Cognition Arousal/Alertness: Awake/alert Behavior During Therapy: WFL for tasks assessed/performed Overall Cognitive Status: Within Functional Limits for tasks assessed                      General Comments General comments (skin integrity, edema, etc.): Nurse made aware that pt was not feeling well and she brought and gave nausea meds.  Nurse also made aware that there was some bloody drainage noted on ace wrap at toe.  CAM walker in place on arrival and entire treatment.  Left CAM walker as well.     Exercises General Exercises - Lower Extremity Ankle Circles/Pumps: AROM;10 reps;Supine;Right Long Arc Quad: AROM;Both;10 reps;Seated Hip Flexion/Marching: AROM;Both;10 reps;Seated   Assessment/Plan    PT Assessment Patient needs continued PT services  PT Problem List Decreased activity tolerance;Decreased balance;Decreased mobility;Decreased knowledge of use of DME;Decreased safety awareness;Decreased knowledge of precautions;Pain          PT Treatment Interventions DME instruction;Gait training;Functional mobility training;Stair training;Therapeutic activities;Balance training;Therapeutic exercise;Patient/family education    PT Goals (Current goals can be found in the Care Plan section)  Acute Rehab PT Goals Patient Stated Goal: to get better PT Goal Formulation: With patient Time For Goal Achievement: 03/24/16 Potential to Achieve Goals: Good    Frequency Min 5X/week   Barriers to discharge        Co-evaluation               End of Session Equipment Utilized During Treatment: Gait belt Activity Tolerance: Patient limited by fatigue;Patient limited by pain Patient left: in  chair;with call bell/phone within reach;with family/visitor present Nurse Communication: Mobility status         Time: 1200-1228 PT Time Calculation (min) (ACUTE ONLY): 28 min   Charges:   PT Evaluation $PT Eval Moderate Complexity: 1 Procedure PT Treatments $Gait Training: 8-22 mins   PT G Codes:        Denice Paradise 2016-03-26, 2:59 PM [employer] Acute Rehabilitation [phone] (pager)

## 2016-03-17 NOTE — Telephone Encounter (Signed)
He was admitted on 03/15/2016 to [facility]. Patient was scheduled for an inpatient surgery at [place] on 03/16/2016 at 4 pm.

## 2016-03-17 NOTE — Discharge Summary (Addendum)
Physician Discharge Summary  [name]. YBO:[id number] DOB: 11-14-1970 DOA: 03/15/2016  PCP: [facility] date: 03/15/2016 Discharge date: 03/17/2016  Admitted From: Home Disposition:  Home  Recommendations for Outpatient Follow-up:  1. Follow up with PCP in 1-2 weeks 2. Follow up with Podiatry, Dr Jacqualyn Posey within 3-5 days   Home Health: No Equipment/Devices:No Discharge Condition: Stable CODE STATUS: FULL Diet recommendation: Heart Healthy / Carb Modified   Brief/Interim Summary: [name]. is a 46 y.o. male with medical history significant of DM, HTN.  Patient sent in to ED by DPM with L great toe osteomyelitis and need for amputation.  Multiple attempts to do this as outpatient unsuccessful due to elevated BGL on day of surgery.  Patient has severe L great toe pain.  Vicodin helps somewhat.  Nothing makes it worse.  Has remained on doxycyline as outpatient. After being admitted he was noted to have left toe osteo. He was taken to the OR by Podiatry and underwent left halux amputation on 03/16/16. It was recommended by Dr Jacqualyn Posey to discharge him on Augmentin and he will follow up the patient in his cultures as outpatient.  Currently he is stable to be discharged and has reached maximum benefit from inpatient stay.    Discharge Diagnoses:  Principal Problem:   Osteomyelitis of toe of left foot (HCC) Active Problems:   Diabetes mellitus without complication (HCC)   Essential hypertension  Osteomyelitis of the left foot s/p Left Halux amputation 03/16/16 -per Dr Jacqualyn Posey-  discharge him on Augmentin and he will follow up cultures outpatient.  -pain control   Dm2 -resume home meds   HTN  - cont home meds     Discharge Instructions   Allergies as of 03/17/2016      Reactions   Levaquin [levofloxacin] Swelling   Phenergan [promethazine Hcl] Diarrhea      Medication List    STOP taking these medications   clindamycin  150 MG capsule Commonly known as:  CLEOCIN   doxycycline 100 MG capsule Commonly known as:  MONODOX   SANTYL ointment Generic drug:  collagenase     TAKE these medications   amoxicillin-clavulanate 875-125 MG tablet Commonly known as:  AUGMENTIN Take 1 tablet by mouth 2 (two) times daily.   carvedilol 6.25 MG tablet Commonly known as:  COREG Take 1 tablet (6.25 mg total) by mouth 2 (two) times daily with a meal.   HYDROcodone-acetaminophen 5-325 MG tablet Commonly known as:  NORCO/VICODIN Take 1 tablet by mouth 2 (two) times daily as needed. What changed:  reasons to take this   insulin aspart 100 UNIT/ML injection Commonly known as:  NOVOLOG Inject 4 Units into the skin 3 (three) times daily before meals.   insulin glargine 100 UNIT/ML injection Commonly known as:  LANTUS Inject 0.2 mLs (20 Units total) into the skin 2 (two) times daily.   lisinopril-hydrochlorothiazide 20-25 MG tablet Commonly known as:  PRINZIDE,ZESTORETIC Take 1 tablet by mouth daily.   ondansetron 4 MG disintegrating tablet Commonly known as:  ZOFRAN ODT Take 1 tablet (4 mg total) by mouth every 8 (eight) hours as needed for nausea or vomiting.   rosuvastatin 40 MG tablet Commonly known as:  CRESTOR Take 40 mg by mouth daily.   tamsulosin 0.4 MG Caps capsule Commonly known as:  FLOMAX Take 0.4 mg by mouth every morning.      Follow-up Information    [name], [name], DPM Follow up in 3 day(s).  Specialty:  Podiatry Contact information: [place] [address] [facility] Follow up in 1 week(s).   Contact information: [address] [place] [address] [phone]          Allergies  Allergen Reactions  . Levaquin [Levofloxacin] Swelling  . Phenergan [Promethazine Hcl] Diarrhea    Consultations:  Dr Jacqualyn Posey from Podiatry   Procedures/Studies: Dg Foot 2 Views Left  Result Date: 03/16/2016 CLINICAL  DATA:  Status post first toe amputation EXAM: LEFT FOOT - 2 VIEW COMPARISON:  03/15/2016 FINDINGS: There is been interval amputation of the first toe. Air is noted in the surgical bed consistent with the recent surgery. No other areas of bony destruction are noted. No other focal abnormality is seen. IMPRESSION: Status post first toe amputation. Electronically Signed   By: Inez Catalina M.D.   On: 03/16/2016 17:35   Dg Foot 2 Views Left  Result Date: 02/27/2016 Please see detailed radiograph report in office note.  Dg Foot 2 Views Left  Result Date: 02/18/2016 CLINICAL DATA:  Big toe on left foot had nail removed. Wound there. Whole left foot bandaged. 2 views per dr order. Non healing wound of lower extremity, unspecified laterality. EXAM: LEFT FOOT - 2 VIEW COMPARISON:  01/24/2016 FINDINGS: There is a fracture across the distal tuft of the great toe, nondisplaced, with ill-defined fracture margins. There is an area of resorption of the cortex along the medial base of the distal phalanx of the great toe. These findings are new from the prior radiographs. There also focal areas of bone resorption along the medial margin of the distal phalanx of the fifth toe, along the distal shaft and distal tuft, not present on the prior study. No other fractures or areas of bone resorption. The joints are normally aligned. There is great toe soft tissue swelling. IMPRESSION: 1. Findings are consistent with osteomyelitis of the distal phalanx of the left great toe with an associated pathologic fracture across the distal tuft. 2. More smoothly defined areas of resorption along the distal phalanx of the fifth toe are also suggestive of osteomyelitis but warrants clinical correlation. Electronically Signed   By: Lajean Manes M.D.   On: 02/18/2016 12:50   Dg Foot Complete Left  Result Date: 03/15/2016 CLINICAL DATA:  Pending great toe amputation tomorrow. History of diabetes. EXAM: LEFT FOOT - COMPLETE 3+ VIEW COMPARISON:   LEFT foot radiograph February 18, 2016 FINDINGS: Re- demonstration bony reabsorption tuft of first distal phalanx. No acute fracture deformity or dislocation. Mild first MTP osteoarthrosis. Moderate plantar calcaneal spur. No bony reabsorption of the fifth distal phalanx on today's radiograph. Great toe soft tissue irregularity without subcutaneous gas or radiopaque foreign bodies. Soft tissue irregularities second distal phalanx. IMPRESSION: First distal phalanx osteomyelitis with soft tissue ulceration. Probable soft tissue ulcer second distal phalanx. Electronically Signed   By: Elon Alas M.D.   On: 03/15/2016 21:19      Subjective:   Discharge Exam: Vitals:   03/17/16 0636 03/17/16 1030  BP: (!) 146/99 (!) 158/93  Pulse: 92 94  Resp: 18   Temp:     Vitals:   03/16/16 2009 03/17/16 0401 03/17/16 0636 03/17/16 1030  BP: 124/76 121/67 (!) 146/99 (!) 158/93  Pulse: 91 92 92 94  Resp: 19 19 18    Temp: 98.1 F (36.7 C) 98.1 F (36.7 C)    TempSrc: Oral Oral    SpO2: 98%  98% 98% 100%  Weight:      Height:        General: Pt is alert, awake, not in acute distress Cardiovascular: RRR, S1/S2 +, no rubs, no gallops Respiratory: CTA bilaterally, no wheezing, no rhonchi Abdominal: Soft, NT, ND, bowel sounds + Extremities: no edema, no cyanosis    The results of significant diagnostics from this hospitalization (including imaging, microbiology, ancillary and laboratory) are listed below for reference.     Microbiology: Recent Results (from the past 240 hour(s))  Aerobic/Anaerobic Culture (surgical/deep wound)     Status: None (Preliminary result)   Collection Time: 03/16/16  4:22 PM  Result Value Ref Range Status   Specimen Description WOUND LEFT TOE  Final   Special Requests GREAT TOE  Final   Gram Stain   Final    RARE WBC PRESENT,BOTH PMN AND MONONUCLEAR NO ORGANISMS SEEN    Culture PENDING  Incomplete   Report Status PENDING  Incomplete  MRSA PCR Screening      Status: None   Collection Time: 03/16/16  6:57 PM  Result Value Ref Range Status   MRSA by PCR NEGATIVE NEGATIVE Final    Comment:        The GeneXpert MRSA Assay (FDA approved for NASAL specimens only), is one component of a comprehensive MRSA colonization surveillance program. It is not intended to diagnose MRSA infection nor to guide or monitor treatment for MRSA infections.      Labs: BNP (last 3 results) No results for input(s): BNP in the last 8760 hours. Basic Metabolic Panel:  Recent Labs Lab 03/15/16 2022  NA 134*  K 4.1  CL 99*  CO2 24  GLUCOSE 355*  BUN 32*  CREATININE 1.53*  CALCIUM 8.8*   Liver Function Tests: No results for input(s): AST, ALT, ALKPHOS, BILITOT, PROT, ALBUMIN in the last 168 hours. No results for input(s): LIPASE, AMYLASE in the last 168 hours. No results for input(s): AMMONIA in the last 168 hours. CBC:  Recent Labs Lab 03/15/16 2022  WBC 7.9  NEUTROABS 3.9  HGB 11.6*  HCT 36.0*  MCV 70.7*  PLT 238   Cardiac Enzymes: No results for input(s): CKTOTAL, CKMB, CKMBINDEX, TROPONINI in the last 168 hours. BNP: Invalid input(s): POCBNP CBG:  Recent Labs Lab 03/16/16 1657 03/16/16 2009 03/17/16 0017 03/17/16 0400 03/17/16 0753  GLUCAP 134* 232* 226* 172* 146*   D-Dimer No results for input(s): DDIMER in the last 72 hours. Hgb A1c No results for input(s): HGBA1C in the last 72 hours. Lipid Profile No results for input(s): CHOL, HDL, LDLCALC, TRIG, CHOLHDL, LDLDIRECT in the last 72 hours. Thyroid function studies No results for input(s): TSH, T4TOTAL, T3FREE, THYROIDAB in the last 72 hours.  Invalid input(s): FREET3 Anemia work up No results for input(s): VITAMINB12, FOLATE, FERRITIN, TIBC, IRON, RETICCTPCT in the last 72 hours. Urinalysis    Component Value Date/Time   COLORURINE YELLOW 01/23/2016 2110   APPEARANCEUR CLEAR 01/23/2016 2110   LABSPEC 1.028 01/23/2016 2110   PHURINE 5.0 01/23/2016 2110   GLUCOSEU  >1000 (A) 01/23/2016 2110   HGBUR TRACE (A) 01/23/2016 2110   BILIRUBINUR NEGATIVE 01/23/2016 2110   [place] NEGATIVE 01/23/2016 2110   PROTEINUR 100 (A) 01/23/2016 2110   NITRITE NEGATIVE 01/23/2016 2110   LEUKOCYTESUR NEGATIVE 01/23/2016 2110   Sepsis Labs Invalid input(s): PROCALCITONIN,  WBC,  LACTICIDVEN Microbiology Recent Results (from the past 240 hour(s))  Aerobic/Anaerobic Culture (surgical/deep wound)     Status: None (Preliminary result)   Collection  Time: 03/16/16  4:22 PM  Result Value Ref Range Status   Specimen Description WOUND LEFT TOE  Final   Special Requests GREAT TOE  Final   Gram Stain   Final    RARE WBC PRESENT,BOTH PMN AND MONONUCLEAR NO ORGANISMS SEEN    Culture PENDING  Incomplete   Report Status PENDING  Incomplete  MRSA PCR Screening     Status: None   Collection Time: 03/16/16  6:57 PM  Result Value Ref Range Status   MRSA by PCR NEGATIVE NEGATIVE Final    Comment:        The GeneXpert MRSA Assay (FDA approved for NASAL specimens only), is one component of a comprehensive MRSA colonization surveillance program. It is not intended to diagnose MRSA infection nor to guide or monitor treatment for MRSA infections.      Time coordinating discharge: Over 30 minutes  SIGNED:   Damita Lack, MD  [facility] 03/17/2016, 11:54 AM Pager   If 7PM-7AM, please contact night-coverage [email or web address] Password [name]

## 2016-03-17 NOTE — Progress Notes (Signed)
Nutrition Brief Note  RD received consult for wound healing.   S/p Procedure on 01/14/17: Left hallux amputation  Pt with active discharge orders, with plan to d/c home today. Pt is currently consuming 100% of meals.   Lab Results  Component Value Date   HGBA1C 15.6 (H) 03/06/2015   Per H&P, DM home regimen is as follows: 4 units novolog TID, 20 units lantus BID. Pt may benefit from referral for outpatient diabetes self-management education and/ or endocrinology to improve glycemic control in the presence of wound healing.   If further RD needs arise prior to discharge, please re-consult RD.  Mahamed Zalewski A. Jimmye Norman, RD, LDN, CDE Pager: [phone] After hours Pager: [phone]

## 2016-03-17 NOTE — Progress Notes (Signed)
Discharge instructions reviewed with pt, prescription given and instructed on where to pick up medication that was called in.  Pt verbalized understanding and had no questions.  Pt discharged in stable condition via wheelchair with wife.  [name]

## 2016-03-17 NOTE — Progress Notes (Signed)
[name] nausea and has vomited 4 times today.  This am emesis was very small amount of clear to yellow.  Most recent was moderate amount.  MD notified and will continue to monitor.  Eliezer [place]

## 2016-03-17 NOTE — Discharge Instructions (Signed)
Diabetes Mellitus and Skin Care Diabetes (diabetes mellitus) can lead to health problems over time, including skin problems. People with diabetes have a higher risk for many types of skin complications. This is because having poorly controlled blood sugar (glucose) levels can:  Damage nerves and blood vessels. This can result in decreased feeling in your legs and feet, which means you may not notice minor skin injuries that could lead to serious problems.  Reduce blood flow (circulation), which makes wounds heal more slowly and increases your risk of infection.  Cause areas of skin to become thick or discolored. What are some common skin conditions that affect people with diabetes? Diabetes often causes dry skin. It can also cause the skin on the feet to get thinner, break more easily, and heal more slowly. There are certain skin conditions that commonly affect people who have diabetes, such as:  Bacterial skin infections, such as styes, boils, infected hair follicles, and infections of the skin around the nails.  Fungal skin infections. These are most common in areas where skin rubs together, such as in the armpits or under the breasts.  Open sores, especially on the feet.  Tissue death (gangrene). This can happen on your feet if a serious infection does not heal properly. Gangrene can cause the need for a foot or leg to be surgically removed (amputated). Diabetes can also cause the skin to change. You may develop:  Dark, velvety markings on the skin that usually appear on the face, neck, armpits, inner thighs, and groin (acanthosis nigricans). This typically affects people of African-American and American-Indian descent.  Red, raised, scar-like tissue that may itch, feel painful, or develop into a wound (necrobiosis lipoidica).  Blisters on feet, toes, hands, or fingers.  Thickened, wax-like areas of skin that usually occur on the hands, forehead, or toes (digital sclerosis).  Brown or  red ring-shaped or half-ring-shaped patches of skin on the ears or fingers (disseminated granuloma).  Pea-shaped yellow bumps that may be itchy and surrounded by a red ring (eruptive xanthomatosis). This usually affects the arms, feet, buttocks, and the top of the hands.  Round, discolored patches of tan skin that do not hurt or itch (diabetic dermopathy). These may look like age spots. What do I need to know about itchy skin? It is common for people with diabetes to have itchy skin caused by dryness. Frequent high blood glucose levels can cause itchiness, and poor circulation and certain skin infections can make dry, itchy skin worse. If you have itchy skin that is red or covered in a rash, this could be a sign of an allergic reaction to a medicine. If you have a rash or if your skin is very itchy, contact your health care provider. You may need help to manage your diabetes better, or you may need treatment for an infection. How can I prevent skin breakdown? When you have diabetes and you get a badly infected ulcer or sore that does not heal, your skin can break down, especially if you have poor circulation or are on bed rest. To prevent skin breakdown:  Keep your skin clean and dry. Wash your skin often. Do not use hot water.  Do not use any products that contain nicotine or tobacco, such as cigarettes and e-cigarettes. Smoking affects the bodys ability to heal. If you need help quitting, ask your health care provider.  Check your skin every day for cuts, bruises, redness, blisters, or sores, especially on your feet. Tell your health care provider  about any cuts, wounds, or sores you have, especially if they are healing slowly.  If you are on bed rest, try to change positions often. What else do I need to know about taking care of my skin?   To relieve dry skin and itching:  Limit baths and showers to 5-10 minutes.  Bathe with lukewarm water instead of hot water.  Use mild soap and  gentle skin cleansers. Do not use soap that is perfumed or harsh or dries your skin.  Put on lotion as soon as you finish bathing.  Make sure that your health care provider performs a visual foot exam at every medical visit.  Schedule a foot exam with your health care provider once every year. This exam includes an inspection of the structure and skin of your feet.  If you get a skin injury, such as a cut, blister, or sore, check the area every day for signs of infection. Check for:  More redness, swelling, or pain.  More fluid or blood.  Warmth.  Pus or a bad smell. Contact a health care provider if:  You develop a cut or sore, especially on your feet.  You develop signs of infection after a skin injury.  Your blood glucose level is higher than 240 mg/dL (13.3 mmol/L) for 2 days in a row.  You have itchy skin that develops redness or a rash.  You have discolored areas of skin.  You have areas where your skin is changing, such as thickening or appearing shiny. This information is not intended to replace advice given to you by your health care provider. Make sure you discuss any questions you have with your health care provider. Document Released: 07/22/2015 Document Revised: 08/29/2015 Document Reviewed: 07/22/2015 Elsevier Interactive Patient Education  2017 [employer].

## 2016-03-18 ENCOUNTER — Other Ambulatory Visit: Payer: BC Managed Care – PPO

## 2016-03-18 ENCOUNTER — Telehealth: Payer: Self-pay | Admitting: *Deleted

## 2016-03-18 NOTE — Telephone Encounter (Signed)
"  My name is [name].  My husband is the patient of I think [name].  My husband is still at the emergency room.  He was supposed to have been admitted yesterday but I guess they don't have a room yet.  Has the doctor found out what time his surgery is going to be this afternoon?"  Surgery was scheduled for 4 pm on 03/16/16

## 2016-03-19 ENCOUNTER — Ambulatory Visit (INDEPENDENT_AMBULATORY_CARE_PROVIDER_SITE_OTHER): Payer: Self-pay | Admitting: Podiatry

## 2016-03-19 ENCOUNTER — Ambulatory Visit: Payer: BC Managed Care – PPO

## 2016-03-19 ENCOUNTER — Encounter: Payer: BC Managed Care – PPO | Admitting: Podiatry

## 2016-03-19 VITALS — BP 132/88 | HR 78 | Temp 97.5°F

## 2016-03-19 DIAGNOSIS — M86172 Other acute osteomyelitis, left ankle and foot: Secondary | ICD-10-CM

## 2016-03-19 DIAGNOSIS — Z89412 Acquired absence of left great toe: Secondary | ICD-10-CM

## 2016-03-21 LAB — AEROBIC/ANAEROBIC CULTURE (SURGICAL/DEEP WOUND)

## 2016-03-21 LAB — AEROBIC/ANAEROBIC CULTURE W GRAM STAIN (SURGICAL/DEEP WOUND): Culture: NO GROWTH

## 2016-03-22 DIAGNOSIS — R52 Pain, unspecified: Secondary | ICD-10-CM

## 2016-03-22 NOTE — Progress Notes (Signed)
DOS 01.23.2018 Removal of big toe left foot.

## 2016-03-23 ENCOUNTER — Encounter: Payer: BC Managed Care – PPO | Admitting: Internal Medicine

## 2016-03-23 DIAGNOSIS — Z794 Long term (current) use of insulin: Secondary | ICD-10-CM | POA: Diagnosis not present

## 2016-03-23 DIAGNOSIS — L97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 with other specified severity: Secondary | ICD-10-CM | POA: Diagnosis not present

## 2016-03-23 DIAGNOSIS — T25312S Burn of third degree of left ankle, sequela: Secondary | ICD-10-CM | POA: Diagnosis not present

## 2016-03-23 DIAGNOSIS — T23262D Burn of second degree of back of left hand, subsequent encounter: Secondary | ICD-10-CM | POA: Diagnosis not present

## 2016-03-23 DIAGNOSIS — T25222D Burn of second degree of left foot, subsequent encounter: Secondary | ICD-10-CM | POA: Diagnosis not present

## 2016-03-23 DIAGNOSIS — X58XXXD Exposure to other specified factors, subsequent encounter: Secondary | ICD-10-CM | POA: Diagnosis not present

## 2016-03-23 DIAGNOSIS — E11622 Type 2 diabetes mellitus with other skin ulcer: Secondary | ICD-10-CM | POA: Diagnosis not present
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
## 2016-03-24 NOTE — Progress Notes (Signed)
[name], [name] ([id number]) Visit Report for 03/23/2016 Arrival Information Details Patient Name: [name], [name] Date of Service: 03/23/2016 2:45 PM Medical Record Patient Account [id number] Number: Number: Date of Birth/Sex: [birth year] (46 y.o. Male) Treating RN: Baruch Gouty, RN, BSN, [place] [facility] [place] Other Clinician: Destenee Guerry: [employer] Treating [place], THE [place] Royalty Domagala/Extender: Memory Argue Referring Prisha Hiley: [employer] Weeks in 8 Treatment: Visit Information History Since Last Visit All ordered tests and consults were No Patient Arrived: Ambulatory completed: Arrival Time: 14:59 Added or deleted any medications: No Accompanied By: self Any new allergies or adverse reactions: No Transfer Assistance: None Had a fall or experienced change in No Patient Identification Verified: Yes activities of daily living that may affect Secondary Verification Process Yes risk of falls: Completed: Signs or symptoms of abuse/neglect No Patient Requires Transmission-Based No since last visito Precautions: Hospitalized since last visit: No Patient Has Alerts: No Has Dressing in Place as Prescribed: Yes Has Footwear/Offloading in Place as Yes Prescribed: Left: Surgical Shoe with Pressure Relief Insole Pain Present Now: Yes Electronic Signature(s) Signed: 03/23/2016 4:52:27 PM By: Regan Lemming BSN, RN Entered By: Regan Lemming on 03/23/2016 15:00:53 [name] ([id number]) -------------------------------------------------------------------------------- Encounter Discharge Information Details Patient Name: [name] Date of Service: 03/23/2016 2:45 PM Medical Record Patient Account [id number] Number: Number: Date of Birth/Sex: 1971/01/09 (46 y.o. Male) Treating RN: Baruch Gouty, RN, BSN, [place] [facility] [place] Other Clinician: Kendryck Lacroix: [facility] Treating [place], THE [place]  Taquila Leys/Extender: Memory Argue Referring Kaliopi Blyden: [employer] Weeks in 8 Treatment: Encounter Discharge Information Items Discharge Pain Level: 0 Discharge Condition: Stable Ambulatory Status: Ambulatory Discharge Destination: Home Transportation: Private Auto Accompanied By: self Schedule Follow-up Appointment: No Medication Reconciliation completed and provided to Patient/Care No Lezly Rumpf: Provided on Clinical Summary of Care: 03/23/2016 Form Type Recipient Paper Patient AB Electronic Signature(s) Signed: 03/23/2016 4:40:46 PM By: Regan Lemming BSN, RN Previous Signature: 03/23/2016 3:27:37 PM Version By: Ruthine Dose Entered By: Regan Lemming on 03/23/2016 16:40:45 [name] ([id number]) -------------------------------------------------------------------------------- Lower Extremity Assessment Details Patient Name: [name] Date of Service: 03/23/2016 2:45 PM Medical Record Patient Account [id number] Number: Number: Date of Birth/Sex: 1970-12-21 (46 y.o. Male) Treating RN: Baruch Gouty, RN, BSN, [place] [facility] [place] Other Clinician: Raelea Gosse: [facility] Treating [place], [facility] Pinchus Weckwerth/Extender: Memory Argue Referring Dodi Leu: [employer] Weeks in 8 Treatment: Edema Assessment Assessed: [Left: No] [Right: No] Edema: [Left: N] [Right: o] Vascular Assessment Claudication: Claudication Assessment [Left:None] Pulses: Dorsalis Pedis Palpable: [Left:Yes] Posterior Tibial Extremity colors, hair growth, and conditions: Extremity Color: [Left:Normal] Hair Growth on Extremity: [Left:No] Temperature of Extremity: [Left:Warm] Capillary Refill: [Left:< 3 seconds] Toe Nail Assessment Left: Right: Thick: Yes Discolored: Yes Deformed: No Improper Length and Hygiene: No Electronic Signature(s) Signed: 03/23/2016 4:52:27 PM By: Regan Lemming BSN, RN Entered By: Regan Lemming on 03/23/2016 15:11:00 [name]  ([id number]) -------------------------------------------------------------------------------- Multi Wound Chart Details Patient Name: [name] Date of Service: 03/23/2016 2:45 PM Medical Record Patient Account [id number] Number: Number: Date of Birth/Sex: Dec 11, 1970 (46 y.o. Male) Treating RN: Baruch Gouty, RN, BSN, [employer] [facility] [place] Other Clinician: Alexiss Iturralde: [facility] Treating [place], THE [facility] Elzie Knisley/Extender: Memory Argue Referring [employer]: [employer] Weeks in 8 Treatment: Vital Signs Height(in): 75 Pulse(bpm): 96 Weight(lbs): 228 Blood Pressure 141/86 (mmHg): Body Mass Index(BMI): 28 Temperature(F): 98.8 Respiratory Rate 18 (breaths/min): Photos: [1:No Photos] [2:No Photos] [4:No Photos] Wound Location: [1:Left Toe Second - Dorsal Left, Medial  Lower Leg] [4:Left, Circumferential Toe Great] Wounding Event: [1:Thermal Burn] [2:Thermal Burn] [4:Gradually Appeared] Primary Etiology: [1:2nd degree Burn] [2:3rd degree Burn] [4:Diabetic Wound/Ulcer of the Lower Extremity] Comorbid History: [1:Hypertension, Type II Diabetes] [2:Hypertension, Type II Diabetes] [4:Hypertension, Type II Diabetes] Date Acquired: [1:01/20/2016] [2:01/20/2016] [4:01/27/2016] Weeks of Treatment: [1:8] [2:8] [4:7] Wound Status: [1:Open] [2:Healed - Epithelialized] [4:Healed - Surgical Closure] Measurements L x W x D 0x0x0 [2:0x0x0] [4:0x0x0] (cm) Area (cm) : [1:0] [2:0] [4:0] Volume (cm) : [1:0] [2:0] [4:0] % Reduction in Area: [1:100.00%] [2:100.00%] [4:100.00%] % Reduction in Volume: 100.00% [2:100.00%] [4:100.00%] Classification: [1:Full Thickness Without Exposed Support Structures] [2:Full Thickness Without Exposed Support Structures] [4:Grade 2] HBO Classification: [1:Grade 1] [2:Grade 1] [4:N/A] Exudate Amount: [1:None Present] [2:Small] [4:Small] Exudate Type: [1:N/A] [2:Serosanguineous] [4:Serosanguineous] Exudate Color: [1:N/A]  [2:red, brown] [4:red, brown] Wound Margin: [1:Indistinct, nonvisible] [2:Distinct, outline attached] [4:Indistinct, nonvisible] Granulation Amount: [1:None Present (0%)] [2:Large (67-100%)] [4:Small (1-33%)] Granulation Quality: N/A Pink, Pale, Friable Pink, Pale Necrotic Amount: None Present (0%) None Present (0%) Large (67-100%) Necrotic Tissue: N/A N/A Eschar, Adherent Slough Exposed Structures: Fascia: No Fascia: No Bone: Yes Fat Layer (Subcutaneous Fat Layer (Subcutaneous Fascia: No Tissue) Exposed: No Tissue) Exposed: No Fat Layer (Subcutaneous Tendon: No Tendon: No Tissue) Exposed: No Muscle: No Muscle: No Tendon: No Joint: No Joint: No Muscle: No Bone: No Bone: No Joint: No Epithelialization: Large (67-100%) Large (67-100%) Medium (34-66%) Periwound Skin Texture: Excoriation: No Excoriation: No Excoriation: No Induration: No Induration: No Induration: No Callus: No Callus: No Callus: No Crepitus: No Crepitus: No Crepitus: No Rash: No Rash: No Rash: No Scarring: No Scarring: No Scarring: No Periwound Skin Dry/Scaly: Yes Dry/Scaly: Yes Maceration: No Moisture: Maceration: No Maceration: No Dry/Scaly: No Periwound Skin Color: Atrophie Blanche: No Atrophie Blanche: No Atrophie Blanche: No Cyanosis: No Cyanosis: No Cyanosis: No Ecchymosis: No Ecchymosis: No Ecchymosis: No Erythema: No Erythema: No Erythema: No Hemosiderin Staining: No Hemosiderin Staining: No Hemosiderin Staining: No Mottled: No Mottled: No Mottled: No Pallor: No Pallor: No Pallor: No Rubor: No Rubor: No Rubor: No Temperature: No Abnormality No Abnormality No Abnormality Tenderness on No Yes Yes Palpation: Wound Preparation: Ulcer Cleansing: Ulcer Cleansing: Ulcer Cleansing: Rinsed/Irrigated with Rinsed/Irrigated with Rinsed/Irrigated with Saline, Other: surg scrub Saline, Other: surg scrub Saline and water with water Topical Anesthetic Topical Anesthetic Applied:  Other: lidocaine Applied: None 4% Assessment Notes: N/A N/A Post left great toe amputation. 11 stitches counted. Stitches intact. Treatment Notes Electronic Signature(s) Signed: 03/23/2016 5:54:02 PM By: Linton Ham MD Entered By: Linton Ham on 03/23/2016 15:21:57 [name] ([id number]) -------------------------------------------------------------------------------- Pain Assessment Details Patient Name: [name] Date of Service: 03/23/2016 2:45 PM Medical Record Patient Account [id number] Number: Number: Date of Birth/Sex: 12-02-1970 (46 y.o. Male) Treating RN: Baruch Gouty, RN, BSN, [place] [facility] [place] Other Clinician: Winston Sobczyk: [employer] Treating [place], [facility] Cordaro Mukai/Extender: Memory Argue Referring Cassady Stanczak: [employer] Weeks in 8 Treatment: Active Problems Location of Pain Severity and Description of Pain Patient Has Paino Yes Site Locations Pain Location: Pain in Ulcers Rate the pain. Current Pain Level: 4 Worst Pain Level: 8 Character of Pain Describe the Pain: Aching, Tender Pain Management and Medication Current Pain Management: Medication: Yes Rest: Yes How does your pain impact your activities of daily livingo Sleep: Yes Bathing: Yes Appetite: Yes Relationship With Others: Yes Bladder Continence: Yes Emotions: Yes Bowel Continence: Yes Work: Yes Toileting: Yes Drive: Yes Dressing: Yes Hobbies: Astronomer) Signed: 03/23/2016 4:52:27 PM By: Regan Lemming BSN,  RN [place], [name] ([id number]) Entered By: Regan Lemming on 03/23/2016 15:00:24 [name] ([id number]) -------------------------------------------------------------------------------- Patient/Caregiver Education Details Patient Name: [name] Date of Service: 03/23/2016 2:45 PM Medical Record Patient Account Number: [id number] Number: Treating RN: Baruch Gouty, RN, BSN, Velva Harman Date of Other  Clinician: 1970/10/31 (46 y.o. Male) Birth/Gender: Treating [place], [place] Physician/Extender: Memory Argue Physician: [facility] Weeks in Treatment: [place] Referring Physician: [employer] Education Assessment Education Provided To: Patient Education Topics Provided Safety: Methods: Explain/Verbal Responses: State content correctly Welcome To The [place]: Methods: Explain/Verbal Responses: State content correctly Electronic Signature(s) Signed: 03/23/2016 4:52:27 PM By: Regan Lemming BSN, RN Entered By: Regan Lemming on 03/23/2016 16:41:07 [name] ([id number]) -------------------------------------------------------------------------------- Wound Assessment Details Patient Name: [name] Date of Service: 03/23/2016 2:45 PM Medical Record Patient Account [id number] Number: Number: Date of Birth/Sex: April 11, 1970 (46 y.o. Male) Treating RN: Baruch Gouty, RN, BSN, [employer] Primary [facility] [place] Other Clinician: Rufina Kimery: [facility] Treating [place], [facility] Dela Sweeny/Extender: Memory Argue Referring Jailah Willis: [employer] Weeks in 8 Treatment: Wound Status Wound Number: 1 Primary Etiology: 2nd degree Burn Wound Location: Left Toe Second - Dorsal Wound Status: Open Wounding Event: Thermal Burn Comorbid History: Hypertension, Type II Diabetes Date Acquired: 01/20/2016 Weeks Of Treatment: 8 Clustered Wound: No Photos Photo Uploaded By: Regan Lemming on 03/23/2016 16:46:28 Wound Measurements Length: (cm) 0 % Reduction in Width: (cm) 0 % Reduction in Depth: (cm) 0 Epithelializat Area: (cm) 0 Tunneling: Volume: (cm) 0 Undermining: Area: 100% Volume: 100% ion: Large (67-100%) No No Wound Description Classification: Zirkelbach, [name] ([id number]) Foul Odor After Cleansing: No Full Thickness Without Slough/Fibrino No Exposed Support Structures Diabetic  Severity Grade 1 (Wagner): Wound Margin: Indistinct, nonvisible Exudate Amount: None Present Wound Bed Granulation Amount: None Present (0%) Exposed Structure Necrotic Amount: None Present (0%) Fascia Exposed: No Fat Layer (Subcutaneous Tissue) Exposed: No Tendon Exposed: No Muscle Exposed: No Joint Exposed: No Bone Exposed: No Periwound Skin Texture Texture Color No Abnormalities Noted: No No Abnormalities Noted: No Callus: No Atrophie Blanche: No Crepitus: No Cyanosis: No Excoriation: No Ecchymosis: No Induration: No Erythema: No Rash: No Hemosiderin Staining: No Scarring: No Mottled: No Pallor: No Moisture Rubor: No No Abnormalities Noted: No Dry / Scaly: Yes Temperature / Pain Maceration: No Temperature: No Abnormality Wound Preparation Ulcer Cleansing: Rinsed/Irrigated with Saline, Other: surg scrub and water, Electronic Signature(s) Signed: 03/23/2016 4:52:27 PM By: Regan Lemming BSN, RN Entered By: Regan Lemming on 03/23/2016 15:08:51 [name] ([id number]) -------------------------------------------------------------------------------- Wound Assessment Details Patient Name: [name] Date of Service: 03/23/2016 2:45 PM Medical Record Patient Account [id number] Number: Number: Date of Birth/Sex: 1971-02-17 (46 y.o. Male) Treating RN: Baruch Gouty, RN, BSN, [employer] Primary [facility] [place] Other Clinician: Ayodele Sangalang: [employer] Treating [facility], [facility] Sharol Croghan/Extender: Memory Argue Referring Mikie Misner: [employer] Weeks in 8 Treatment: Wound Status Wound Number: 2 Primary Etiology: 3rd degree Burn Wound Location: Left, Medial Lower Leg Wound Status: Healed - Epithelialized Wounding Event: Thermal Burn Comorbid History: Hypertension, Type II Diabetes Date Acquired: 01/20/2016 Weeks Of Treatment: 8 Clustered Wound: No Photos Photo Uploaded By: Regan Lemming on 03/23/2016 16:46:28 Wound  Measurements Length: (cm) 0 % Reduction Width: (cm) 0 % Reduction Depth: (cm) 0 Epitheliali Area: (cm) 0 Tunneling: Volume: (cm) 0 Underminin in Area: 100% in Volume: 100% zation: Large (67-100%) No g: No Wound Description Full Thickness Without Exposed Foul Odor A Classification: Support Structures Slough/Fibr Diabetic Severity  Grade 1 (Wagner): Wound Margin: Distinct, outline attached Exudate Amount: Small Exudate Type: Serosanguineous Exudate Color: red, brown Koeneman, Gabrial ([id number]) fter Cleansing: No ino No Wound Bed Granulation Amount: Large (67-100%) Exposed Structure Granulation Quality: Pink, Pale, Friable Fascia Exposed: No Necrotic Amount: None Present (0%) Fat Layer (Subcutaneous Tissue) Exposed: No Tendon Exposed: No Muscle Exposed: No Joint Exposed: No Bone Exposed: No Periwound Skin Texture Texture Color No Abnormalities Noted: No No Abnormalities Noted: No Callus: No Atrophie Blanche: No Crepitus: No Cyanosis: No Excoriation: No Ecchymosis: No Induration: No Erythema: No Rash: No Hemosiderin Staining: No Scarring: No Mottled: No Pallor: No Moisture Rubor: No No Abnormalities Noted: No Dry / Scaly: Yes Temperature / Pain Maceration: No Temperature: No Abnormality Tenderness on Palpation: Yes Wound Preparation Ulcer Cleansing: Rinsed/Irrigated with Saline, Other: surg scrub with water, Topical Anesthetic Applied: None Electronic Signature(s) Signed: 03/23/2016 4:52:27 PM By: Regan Lemming BSN, RN Entered By: Regan Lemming on 03/23/2016 15:20:05 [name] ([id number]) -------------------------------------------------------------------------------- Wound Assessment Details Patient Name: [name] Date of Service: 03/23/2016 2:45 PM Medical Record Patient Account [id number] Number: Number: Date of Birth/Sex: 03-Nov-1970 (46 y.o. Male) Treating RN: Baruch Gouty, RN, BSN, [place] [facility] [place] Other Clinician: Treyvion Durkee: [facility] Treating [place], [facility] Armenta Erskin/Extender: Memory Argue Referring Jennika Ringgold: [employer] Weeks in 8 Treatment: Wound Status Wound Number: 4 Primary Diabetic Wound/Ulcer of the Lower Etiology: Extremity Wound Location: Left, Circumferential Toe Great Wound Status: Healed - Surgical Closure Wounding Event: Gradually Appeared Comorbid Hypertension, Type II Diabetes Date Acquired: 01/27/2016 History: Weeks Of Treatment: 7 Clustered Wound: No Photos Photo Uploaded By: Regan Lemming on 03/23/2016 16:48:01 Wound Measurements Length: (cm) 0 % Reduction Width: (cm) 0 % Reduction Depth: (cm) 0 Epitheliali Area: (cm) 0 Tunneling: Volume: (cm) 0 Underminin in Area: 100% in Volume: 100% zation: Medium (34-66%) No g: No Wound Description Classification: Grade 2 Wound Margin: Indistinct, nonvisible Exudate Amount: Small Exudate Type: Serosanguineous Exudate Color: red, brown Foul Odor After Cleansing: No Slough/Fibrino No Wound Bed Granulation Amount: Small (1-33%) Exposed Structure Dusing, Jennie ([id number]) Granulation Quality: Pink, Pale Fascia Exposed: No Necrotic Amount: Large (67-100%) Fat Layer (Subcutaneous Tissue) Exposed: No Necrotic Quality: Eschar, Adherent Slough Tendon Exposed: No Muscle Exposed: No Joint Exposed: No Bone Exposed: Yes Periwound Skin Texture Texture Color No Abnormalities Noted: No No Abnormalities Noted: No Callus: No Atrophie Blanche: No Crepitus: No Cyanosis: No Excoriation: No Ecchymosis: No Induration: No Erythema: No Rash: No Hemosiderin Staining: No Scarring: No Mottled: No Pallor: No Moisture Rubor: No No Abnormalities Noted: No Dry / Scaly: No Temperature / Pain Maceration: No Temperature: No Abnormality Tenderness on Palpation: Yes Wound Preparation Ulcer Cleansing: Rinsed/Irrigated with Saline Topical Anesthetic Applied: Other: lidocaine  4%, Assessment Notes Post left great toe amputation. 11 stitches counted. Stitches intact. Electronic Signature(s) Signed: 03/23/2016 4:52:27 PM By: Regan Lemming BSN, RN Entered By: Regan Lemming on 03/23/2016 15:20:06 [name] ([id number]) -------------------------------------------------------------------------------- Vitals Details Patient Name: [name] Date of Service: 03/23/2016 2:45 PM Medical Record Patient Account [id number] Number: Number: Date of Birth/Sex: Aug 25, 1970 (46 y.o. Male) Treating RN: Baruch Gouty, RN, BSN, [place] [facility] THE [place] Other Clinician: Moriah Shawley: [employer] Treating [place], [facility] Timotheus Salm/Extender: Memory Argue Referring Gwendolynn Merkey: [employer] Weeks in 8 Treatment: Vital Signs Time Taken: 15:00 Temperature (F): 98.8 Height (in): 75 Pulse (bpm): 96 Weight (lbs): 228 Respiratory Rate (breaths/min): 18 Body Mass Index (BMI): 28.5 Blood Pressure (mmHg): 141/86 Reference Range: 80 - 120 mg /  dl Electronic Signature(s) Signed: 03/23/2016 4:52:27 PM By: Regan Lemming BSN, RN Entered By: Regan Lemming on 03/23/2016 15:00:41

## 2016-03-24 NOTE — Progress Notes (Signed)
Subjective: [name]. is a 46 y.o. is seen today in office s/p left hallux amputation. They state their pain is currently controlled and it has been improving since the surgery. He has continued with Augmentin. He has continued with the surgical shoe.  Denies any systemic complaints such as fevers, chills, nausea, vomiting. No calf pain, chest pain, shortness of breath.   Objective: General: No acute distress, AAOx3  DP/PT pulses palpable 2/4, CRT < 3 sec to all digits.  Protective sensation intact. Motor function intact.  Left foot: Incision is well coapted without any evidence of dehiscence and sutures are intact. There is no surrounding erythema, ascending cellulitis, fluctuance, crepitus, malodor, drainage/purulence. There is mild edema around the surgical site. There is minimal pain along the surgical site.  No other areas of tenderness to bilateral lower extremities.  No other open lesions or pre-ulcerative lesions.  No pain with calf compression, swelling, warmth, erythema.   Assessment and Plan:  Status post left hallux amputation, doing well with 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s   -Treatment options discussed including all alternatives, risks, and complications -Antibiotic ointment and bandage applied. Keep dressing clean, dry, intact.  -Continue surgical shoe at all times.  -Finish course of antibiotics  -Ice/elevation -Pain medication as needed. -Monitor for any clinical signs or symptoms of infection and DVT/PE and directed to call the office immediately should any occur or go to the ER. -Follow-up as scheduled for POSSIBLE suture removal or sooner if any problems arise. In the meantime, encouraged to call the office with any questions, concerns, change in symptoms.   Celesta Gentile, DPM

## 2016-03-25 ENCOUNTER — Telehealth: Payer: Self-pay | Admitting: *Deleted

## 2016-03-25 NOTE — Telephone Encounter (Addendum)
-----   Message from Trula Slade, DPM sent at 03/24/2016 10:56 AM EST ----- Wound culture negative. Please let him know.03/25/2016-Informed pt of Dr. Leigh Aurora review of results and pt states understanding and will see [place] at his appt tomorrow.

## 2016-03-26 ENCOUNTER — Ambulatory Visit (INDEPENDENT_AMBULATORY_CARE_PROVIDER_SITE_OTHER): Payer: Self-pay | Admitting: Podiatry

## 2016-03-26 ENCOUNTER — Encounter: Payer: Self-pay | Admitting: Podiatry

## 2016-03-26 VITALS — BP 160/100 | HR 78 | Temp 98.6°F

## 2016-03-26 DIAGNOSIS — Z89412 Acquired absence of left great toe: Secondary | ICD-10-CM

## 2016-03-26 DIAGNOSIS — M86172 Other acute osteomyelitis, left ankle and foot: Secondary | ICD-10-CM

## 2016-03-26 NOTE — Progress Notes (Signed)
Subjective: [name]. is a 46 y.o. is seen today in office s/p left hallux amputation. He states he is doing much better his pain is greatly improved. He denies any drainage or pus or an increase in swelling or redness. He has continued with antibiotics. Denies any systemic complaints such as fevers, chills, nausea, vomiting. No calf pain, chest pain, shortness of breath.   Objective: General: No acute distress, AAOx3  DP/PT pulses palpable 2/4, CRT < 3 sec to all digits.  Protective sensation intact. Motor function intact.  Left foot: Incision is well coapted without any evidence of dehiscence and sutures are intact. There is no surrounding erythema, ascending cellulitis, fluctuance, crepitus, malodor, drainage/purulence. There is decreased edema around the surgical site. There is no pain along the surgical site. He states he is doing much better.  No other areas of tenderness to bilateral lower extremities.  No other open lesions or pre-ulcerative lesions.  No pain with calf compression, swelling, warmth, erythema.   Assessment and Plan:  Status post left hallux amputation, doing well with 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s   -Treatment options discussed including all alternatives, risks, and complications -Half of the sutures were removed today without complications. Antibiotic ointment and bandage applied. Keep dressing clean, dry, intact.  -Continue surgical shoe at all times.  -Finish course of antibiotics  -Ice/elevation -Pain medication as needed. -Monitor for any clinical signs or symptoms of infection and DVT/PE and directed to call the office immediately should any occur or go to the ER. -Follow-up in 1 week for suture removal or sooner if any problems arise. In the meantime, encouraged to call the office with any questions, concerns, change in symptoms.   Celesta Gentile, DPM

## 2016-04-01 ENCOUNTER — Ambulatory Visit (INDEPENDENT_AMBULATORY_CARE_PROVIDER_SITE_OTHER): Payer: BC Managed Care – PPO | Admitting: Podiatry

## 2016-04-01 ENCOUNTER — Ambulatory Visit (INDEPENDENT_AMBULATORY_CARE_PROVIDER_SITE_OTHER): Payer: BC Managed Care – PPO

## 2016-04-01 DIAGNOSIS — Z89412 Acquired absence of left great toe: Secondary | ICD-10-CM

## 2016-04-01 DIAGNOSIS — M86172 Other acute osteomyelitis, left ankle and foot: Secondary | ICD-10-CM

## 2016-04-07 NOTE — Progress Notes (Signed)
Subjective: [name]. is a 46 y.o. is seen today in office s/p left hallux amputation. He presents at the wrist the sutures removed. He's having minimal pain. He is continuing the surgical shoe.Denies any systemic complaints such as fevers, chills, nausea, vomiting. No calf pain, chest pain, shortness of breath.   Objective: General: No acute distress, AAOx3  DP/PT pulses palpable 2/4, CRT < 3 sec to all digits.  Protective sensation intact. Motor function intact.  Left foot: Incision is well coapted without any evidence of dehiscence and sutures are intact. There is no surrounding erythema, ascending cellulitis, fluctuance, crepitus, malodor, drainage/purulence. There is minimal edema around the surgical site. There is no pain along the surgical site.  No other areas of tenderness to bilateral lower extremities.  No other open lesions or pre-ulcerative lesions.  No pain with calf compression, swelling, warmth, erythema.   Assessment and Plan:  Status post left hallux amputation, doing well with 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s   -Treatment options discussed including all alternatives, risks, and complications -Remainder of the sutures were removed today without complications. Antibiotic ointment and bandage applied. He can change the dressing daily. He consented to shower next week as long as the incision remains closed. -Continue surgical shoe at all times.  -Finish course of antibiotics  -Ice/elevation -Pain medication as needed. -Monitor for any clinical signs or symptoms of infection and DVT/PE and directed to call the office immediately should any occur or go to the ER. -Follow-up in 2 weeks  or sooner if any problems arise. In the meantime, encouraged to call the office with any questions, concerns, change in symptoms.   Celesta Gentile, DPM

## 2016-04-20 DIAGNOSIS — Z794 Long term (current) use of insulin: Secondary | ICD-10-CM

## 2016-04-20 DIAGNOSIS — E1165 Type 2 diabetes mellitus with hyperglycemia: Secondary | ICD-10-CM | POA: Insufficient documentation

## 2016-04-20 DIAGNOSIS — E1169 Type 2 diabetes mellitus with other specified complication: Secondary | ICD-10-CM | POA: Insufficient documentation

## 2016-04-23 ENCOUNTER — Encounter: Payer: Self-pay | Admitting: Podiatry

## 2016-04-23 ENCOUNTER — Ambulatory Visit (INDEPENDENT_AMBULATORY_CARE_PROVIDER_SITE_OTHER): Payer: BC Managed Care – PPO

## 2016-04-23 ENCOUNTER — Telehealth: Payer: Self-pay | Admitting: *Deleted

## 2016-04-23 ENCOUNTER — Ambulatory Visit (INDEPENDENT_AMBULATORY_CARE_PROVIDER_SITE_OTHER): Payer: Self-pay | Admitting: Podiatry

## 2016-04-23 VITALS — BP 132/87 | HR 92 | Resp 18

## 2016-04-23 DIAGNOSIS — Z89412 Acquired absence of left great toe: Secondary | ICD-10-CM

## 2016-04-23 DIAGNOSIS — T148XXA Other injury of unspecified body region, initial encounter: Secondary | ICD-10-CM

## 2016-04-23 DIAGNOSIS — R609 Edema, unspecified: Secondary | ICD-10-CM

## 2016-04-23 NOTE — Telephone Encounter (Addendum)
Informed pt he is schedule for venous doppler on 04/26/2016 arrive at 8:15am and test 8:30am. Faxed orders to [facility]. 04/28/2016-Pt states the test showed no DVT, and he was wondering what [name] was going to do for his leg and if he was going to fit him for his insert. Pt asked if [name] would okay him for his prostate surgery.04/29/2016-I informed pt of Dr. Leigh Aurora orders and offered compression hose for left leg. Pt states he would like the compression hose. I gave pt the [facility] [phone] and address, faxed rx for left compression hose 20-106mmHg to the knee order. Pt called again and states he wanted to know when he could schedule to be fitted for the big toe insert for his shoe. 05/06/2016-Pt asked if [name] had heard anything concerning his insert, that he is beginning to have problem with balance, and discomfort. I told pt I would ask [name] and call again. I told pt I would have schedulers call to get him in sooner for the balance, and edema problem. I asked pt if he had gotten the compression hose and he stated he forgot but would get them today.08/13/2016-[facility] called for PT orders for pt.08/17/2016-Orders faxed to [facility].

## 2016-04-23 NOTE — Progress Notes (Signed)
Subjective: [name]. is a 46 y.o. is seen today in office s/p left hallux amputation. He states is doing well and not having any pain. He denies any swelling or redness to the toe. He does have swelling to the left leg been ongoing. Denies any systemic complaints such as fevers, chills, nausea, vomiting. No calf pain, chest pain, shortness of breath.   Objective: General: No acute distress, AAOx3  DP/PT pulses palpable 2/4, CRT < 3 sec to all digits.  Protective sensation intact. Motor function intact.  Left foot: Incision is well coapted without any evidence of dehiscence and a scar is formed. ing erythema, ascending cellulitis, fluctuance, crepitus, malodor, drainage/purulence. There is minimal edema around the surgical site. There is no pain along the surgical site.  There is swelling to the left leg however there is no pain with compression, erythema or warmth. There is no other open sores identified. Nails are hypertrophic, dystrophic, brittle, discolored, elongated 9. There is tenderness in nails 1-5 bilaterally except for the left hallux which is been recently amputated. There is no surrounding redness or drainage or any signs of infection.  No pain with calf compression, swelling, warmth, erythema.   Assessment and Plan:  Status post left hallux amputation, doing well with 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s; symptomatic onychomycosis    -Treatment options discussed including all alternatives, risks, and complications -X-rays were obtained reviewed today. There is no evidence of acute fracture. No evidence of acute a minus. -Started paperwork for diabetic shoe precertification with custom inserts. -Will obtain a venous duplex to rule out DVT -Compression socks discussed and recommended. -Monitor for any clinical signs or symptoms of infection and DVT/PE and directed to call the office immediately should any occur or go to the ER. -Follow-up in 4 weeks  or sooner if any problems arise. In the  meantime, encouraged to call the office with any questions, concerns, change in symptoms.   Celesta Gentile, DPM

## 2016-04-26 ENCOUNTER — Telehealth: Payer: Self-pay

## 2016-04-26 ENCOUNTER — Ambulatory Visit (HOSPITAL_COMMUNITY)
Admission: RE | Admit: 2016-04-26 | Discharge: 2016-04-26 | Disposition: A | Discharge status: Home / Self Care | Payer: BC Managed Care – PPO | Source: Ambulatory Visit | Attending: Surgery | Admitting: Surgery

## 2016-04-26 DIAGNOSIS — R609 Edema, unspecified: Secondary | ICD-10-CM | POA: Insufficient documentation

## 2016-04-26 DIAGNOSIS — Z89412 Acquired absence of left great toe: Secondary | ICD-10-CM | POA: Insufficient documentation

## 2016-04-26 NOTE — Telephone Encounter (Signed)
Helene Kelp from [facility] called stating patient's Lt venous dopplar results were negative, no DVT or thrombus found, just enlarged lymph nodes. Pt to follow up with Dr Jacqualyn Posey as scheduled.

## 2016-04-28 NOTE — Telephone Encounter (Signed)
Yes he should be good for prostate surgery. I had discussed with him doing a compression stocking for the leg. Also, we will do an insert like we discussed at the last appointment, I just wanted some of the swelling to come down.

## 2016-04-29 NOTE — Telephone Encounter (Signed)
I did the paperwork last appointment for the authorization. I am awiting the approval.

## 2016-05-06 ENCOUNTER — Encounter: Payer: Self-pay | Admitting: Podiatry

## 2016-05-06 ENCOUNTER — Ambulatory Visit (INDEPENDENT_AMBULATORY_CARE_PROVIDER_SITE_OTHER): Payer: Self-pay | Admitting: Podiatry

## 2016-05-06 ENCOUNTER — Ambulatory Visit (INDEPENDENT_AMBULATORY_CARE_PROVIDER_SITE_OTHER): Payer: BC Managed Care – PPO

## 2016-05-06 DIAGNOSIS — Z89412 Acquired absence of left great toe: Secondary | ICD-10-CM

## 2016-05-06 DIAGNOSIS — R609 Edema, unspecified: Secondary | ICD-10-CM

## 2016-05-10 NOTE — Progress Notes (Signed)
Subjective: 46 year old male presents the office they for follow-up evaluation of left hallux amputation. If this area is doing well and is healed very nicely. He continues to get swelling to the leg into the foot at times. He states the swelling is intermittent. He denies any recent injury or trauma denies any pain to the leg. His previously venous duplex was negative for DVT. Lymph node was present. Denies any systemic complaints such as fevers, chills, nausea, vomiting. No acute changes since last appointment, and no other complaints at this time.   Objective: AAO x3, NAD DP/PT pulses palpable bilaterally, CRT less than 3 seconds Incision is well-healed from the prior surgery. There is no pain to the surgical site is no [place] surgical site. The majority of swelling appears to be to the leg. There is no pain with calf compression, erythema, warmth. The calf is supple. There is no area pinpoint bony tenderness or pain the vibratory sensation.  No open lesions or pre-ulcerative lesions.  No pain with calf compression, swelling, warmth, erythema  Assessment: Left leg swelling  Plan: -All treatment options discussed with the patient including all alternatives, risks, complications.  -Venous duplex results were discussed the patient. Negative for DVT. Lymph node was present. We'll send this his primary care physician as well. -Compression socks recommend it. Prescription for compression socks were given the patient today. -Was the swelling decreases we will measuring for diabetic shoes with a toe filler. -Monitor for any clinical signs or symptoms of infection and directed to call the office immediately should any occur or go to the ER. -RTC as scheduled or sooner if needed.  -Patient encouraged to call the office with any questions, concerns, change in symptoms.   Celesta Gentile, DPM

## 2016-05-11 ENCOUNTER — Inpatient Hospital Stay: Admission: RE | Admit: 2016-05-11 | Payer: BC Managed Care – PPO | Source: Ambulatory Visit

## 2016-05-12 ENCOUNTER — Inpatient Hospital Stay: Admission: RE | Admit: 2016-05-12 | Payer: BC Managed Care – PPO | Source: Ambulatory Visit

## 2016-05-18 ENCOUNTER — Ambulatory Visit: Admission: RE | Admit: 2016-05-18 | Payer: BC Managed Care – PPO | Source: Ambulatory Visit | Admitting: Urology

## 2016-05-18 ENCOUNTER — Encounter: Admission: RE | Payer: Self-pay | Source: Ambulatory Visit

## 2016-05-18 SURGERY — INSERTION, PENILE PROSTHESIS, INFLATABLE
Anesthesia: Choice

## 2016-05-20 ENCOUNTER — Ambulatory Visit: Payer: BC Managed Care – PPO | Admitting: Podiatry

## 2016-05-25 ENCOUNTER — Telehealth: Payer: Self-pay | Admitting: Podiatry

## 2016-05-25 NOTE — Telephone Encounter (Signed)
Pt. Called, he was waiting to hear something about the compression hose he was suppose to get. He is scheduled for an appointment with [facility] 4/5 to be measured for orthotics. Pt said his foot was swollen last time he was in, so he called to r/s

## 2016-05-25 NOTE — Telephone Encounter (Signed)
He went to [place] to get the compression stockings. They even called the day he went to clarify which ones I wanted him to get (a light open toe one). I was wanting to see if the swelling came down before being measured so it would not be too big.

## 2016-05-27 ENCOUNTER — Ambulatory Visit (INDEPENDENT_AMBULATORY_CARE_PROVIDER_SITE_OTHER): Payer: BC Managed Care – PPO | Admitting: Podiatry

## 2016-05-27 DIAGNOSIS — L98499 Non-pressure chronic ulcer of skin of other sites with unspecified severity: Secondary | ICD-10-CM

## 2016-05-27 DIAGNOSIS — Z89412 Acquired absence of left great toe: Secondary | ICD-10-CM

## 2016-05-27 DIAGNOSIS — E1149 Type 2 diabetes mellitus with other diabetic neurological complication: Secondary | ICD-10-CM

## 2016-05-27 DIAGNOSIS — IMO0002 Reserved for concepts with insufficient information to code with codable children: Secondary | ICD-10-CM

## 2016-05-27 DIAGNOSIS — E1140 Type 2 diabetes mellitus with diabetic neuropathy, unspecified: Secondary | ICD-10-CM

## 2016-05-27 NOTE — Progress Notes (Signed)
Mr Snedden presents today on orders from [name] for casting of CMFO w/ toe filler to address balance issues post hallux amputation left.   I cast Mr. Sagan in biofoam bilaterally and will fabricate said device through [employer].  I cast bilat due to 1) comfort and 2) protection of right foot of complications secondary to DM2.  Plan on trilam for both.  [name] to drop charges for L5000 L and l3010 R

## 2016-06-04 NOTE — Telephone Encounter (Signed)
I spoke with pt and he said the last time he was at the medical supply store, they did not have the ones [name] wanted him to have. I gave pt the [facility] phone to call to see if proper hose were available at this time.

## 2016-06-10 DIAGNOSIS — R52 Pain, unspecified: Secondary | ICD-10-CM

## 2016-06-11 ENCOUNTER — Encounter: Payer: Self-pay | Admitting: Podiatry

## 2016-06-11 ENCOUNTER — Ambulatory Visit (INDEPENDENT_AMBULATORY_CARE_PROVIDER_SITE_OTHER): Payer: BC Managed Care – PPO | Admitting: Podiatry

## 2016-06-11 ENCOUNTER — Ambulatory Visit (INDEPENDENT_AMBULATORY_CARE_PROVIDER_SITE_OTHER): Payer: BC Managed Care – PPO

## 2016-06-11 DIAGNOSIS — M79675 Pain in left toe(s): Secondary | ICD-10-CM | POA: Diagnosis not present

## 2016-06-11 DIAGNOSIS — E1140 Type 2 diabetes mellitus with diabetic neuropathy, unspecified: Secondary | ICD-10-CM

## 2016-06-11 DIAGNOSIS — M779 Enthesopathy, unspecified: Secondary | ICD-10-CM

## 2016-06-11 DIAGNOSIS — M79674 Pain in right toe(s): Secondary | ICD-10-CM | POA: Diagnosis not present

## 2016-06-11 DIAGNOSIS — B351 Tinea unguium: Secondary | ICD-10-CM | POA: Diagnosis not present

## 2016-06-11 DIAGNOSIS — E1149 Type 2 diabetes mellitus with other diabetic neurological complication: Secondary | ICD-10-CM | POA: Diagnosis not present

## 2016-06-14 NOTE — Progress Notes (Signed)
Subjective: 46 year old male presents the office they for concerns of thick, painful, elongated toenails to the remainder of his nails and asking them to be trimmed today. He states they're causing irritation shoes. He states the surgical site is doing very well and said no problems. He also states the swelling to his leg into his foot is also greatly improved. He also has new concerns today with pain to the all the aspect the left foot and ankle. This been on the last couple weeks but he was doing yard work yesterday and since then he has had increase in pain to this part of his foot. He believes that given his big toes been amputated he maybe walking differently as well. He denies any specific injury. Denies any redness or warmth. Denies any systemic complaints such as fevers, chills, nausea, vomiting. No acute changes since last appointment, and no other complaints at this time.   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 There is no pain or swelling to the surgical site. Nails are hypertrophic, dystrophic, brittle, discolored, elongated 9. No surrounding redness or drainage. Tenderness nails 1-5 bilaterally except left hallux which is previously been amputated and. No open lesions or pre-ulcerative lesions are identified today. There is tenderness the left lateral foot just proximal to the fifth metatarsal base as well as to the posterior aspect of the lateral ankle just posterior to lateral malleolus on the course of the peroneal tendons. The peroneal tendons appear to be intact but there is tenderness along the tendon. There is no specific area of tenderness identified at this time. Overall his swelling to his foot and his ankle is much improved compared to what it was previously. Is no erythema or increase in warmth. No open lesions or pre-ulcerative lesions.  No pain with calf compression,  swelling, warmth, erythema  Assessment: Left foot peroneal tendinitis, synthetic onychomycosis with healed surgical site  Plan: -All treatment options discussed with the patient including all alternatives, risks, complications.  -Nail sharply debrided times nylon without, occasions or bleeding. -Dispensed Tri-Lock ankle brace. Denies the area as needed. Elevation. At that this is due to overuse. -Awaiting his diabetic shoe with insert. However this will help him walk more normal. -Daily foot inspection. -Follow-up as scheduled or sooner if any. -Patient encouraged to call the office with any questions, concerns, change in symptoms.   Celesta Gentile, DPM

## 2016-06-16 ENCOUNTER — Other Ambulatory Visit: Payer: BC Managed Care – PPO

## 2016-06-17 ENCOUNTER — Other Ambulatory Visit: Payer: BC Managed Care – PPO

## 2016-07-05 ENCOUNTER — Ambulatory Visit: Payer: BC Managed Care – PPO | Admitting: Podiatry

## 2016-07-08 ENCOUNTER — Encounter: Payer: Self-pay | Admitting: Emergency Medicine

## 2016-07-08 ENCOUNTER — Inpatient Hospital Stay
Admission: EM | Admit: 2016-07-08 | Discharge: 2016-07-09 | DRG: 639 | Disposition: A | Discharge status: Home / Self Care | Payer: BC Managed Care – PPO | Attending: Internal Medicine | Admitting: Internal Medicine

## 2016-07-08 DIAGNOSIS — E1165 Type 2 diabetes mellitus with hyperglycemia: Principal | ICD-10-CM | POA: Diagnosis present

## 2016-07-08 DIAGNOSIS — R739 Hyperglycemia, unspecified: Secondary | ICD-10-CM

## 2016-07-08 DIAGNOSIS — Z79899 Other long term (current) drug therapy: Secondary | ICD-10-CM

## 2016-07-08 DIAGNOSIS — Z7984 Long term (current) use of oral hypoglycemic drugs: Secondary | ICD-10-CM

## 2016-07-08 DIAGNOSIS — E1300 Other specified diabetes mellitus with hyperosmolarity without nonketotic hyperglycemic-hyperosmolar coma (NKHHC): Secondary | ICD-10-CM

## 2016-07-08 DIAGNOSIS — I129 Hypertensive chronic kidney disease with stage 1 through stage 4 chronic kidney disease, or unspecified chronic kidney disease: Secondary | ICD-10-CM | POA: Diagnosis present

## 2016-07-08 DIAGNOSIS — N183 Chronic kidney disease, stage 3 (moderate): Secondary | ICD-10-CM | POA: Diagnosis present

## 2016-07-08 DIAGNOSIS — Z8249 Family history of ischemic heart disease and other diseases of the circulatory system: Secondary | ICD-10-CM

## 2016-07-08 DIAGNOSIS — Z794 Long term (current) use of insulin: Secondary | ICD-10-CM

## 2016-07-08 DIAGNOSIS — Z833 Family history of diabetes mellitus: Secondary | ICD-10-CM

## 2016-07-08 DIAGNOSIS — E1122 Type 2 diabetes mellitus with diabetic chronic kidney disease: Secondary | ICD-10-CM | POA: Diagnosis present

## 2016-07-08 LAB — BASIC METABOLIC PANEL
ANION GAP: 7 (ref 5–15)
BUN: 35 mg/dL — ABNORMAL HIGH (ref 6–20)
CHLORIDE: 95 mmol/L — AB (ref 101–111)
CO2: 28 mmol/L (ref 22–32)
Calcium: 8.8 mg/dL — ABNORMAL LOW (ref 8.9–10.3)
Creatinine, Ser: 1.76 mg/dL — ABNORMAL HIGH (ref 0.61–1.24)
GFR calc Af Amer: 51 mL/min — ABNORMAL LOW (ref >60)
GFR calc non Af Amer: 44 mL/min — ABNORMAL LOW (ref >60)
Glucose, Bld: 574 mg/dL (ref 65–99)
POTASSIUM: 3.9 mmol/L (ref 3.5–5.1)
Sodium: 130 mmol/L — ABNORMAL LOW (ref 135–145)

## 2016-07-08 LAB — CBC
HCT: 37.4 % — ABNORMAL LOW (ref 40.0–52.0)
Hemoglobin: 11.8 g/dL — ABNORMAL LOW (ref 13.0–18.0)
MCH: 22.2 pg — AB (ref 26.0–34.0)
MCHC: 31.5 g/dL — AB (ref 32.0–36.0)
MCV: 70.6 fL — AB (ref 80.0–100.0)
PLATELETS: 267 10*3/uL (ref 150–440)
RBC: 5.3 MIL/uL (ref 4.40–5.90)
RDW: 14.3 % (ref 11.5–14.5)
WBC: 7.9 10*3/uL (ref 3.8–10.6)

## 2016-07-08 LAB — BETA-HYDROXYBUTYRIC ACID: BETA-HYDROXYBUTYRIC ACID: 0.11 mmol/L (ref 0.05–0.27)

## 2016-07-08 LAB — GLUCOSE, CAPILLARY
GLUCOSE-CAPILLARY: 258 mg/dL — AB (ref 65–99)
Glucose-Capillary: 522 mg/dL (ref 65–99)
Glucose-Capillary: 437 mg/dL — ABNORMAL HIGH (ref 65–99)
Glucose-Capillary: 385 mg/dL — ABNORMAL HIGH (ref 65–99)

## 2016-07-08 MED ORDER — SODIUM CHLORIDE 0.9 % IV SOLN
Freq: Once | INTRAVENOUS | Status: AC
  Administered 2016-07-08: 21:00:00 via INTRAVENOUS

## 2016-07-08 MED ORDER — INSULIN ASPART 100 UNIT/ML ~~LOC~~ SOLN: 0.0000 [IU] | Freq: Three times a day (TID) | SUBCUTANEOUS | Status: DC

## 2016-07-08 MED ORDER — INSULIN REGULAR HUMAN 100 UNIT/ML IJ SOLN
INTRAMUSCULAR | Status: DC
  Administered 2016-07-08: 3.8 [IU]/h via INTRAVENOUS
  Administered 2016-07-09: 4 [IU]/h via INTRAVENOUS
  Filled 2016-07-08: qty 1

## 2016-07-08 NOTE — H&P (Signed)
[facility] [place] NAME: [name]    MR#:  [id number]  DATE OF BIRTH:  January 14, 1971  DATE OF ADMISSION:  07/08/2016  PRIMARY CARE PHYSICIAN: [facility]   REQUESTING/REFERRING PHYSICIAN: [place]  CHIEF COMPLAINT:   Chief Complaint  Patient presents with  . Nausea    HISTORY OF PRESENT ILLNESS: [name]  is a 46 y.o. male with a known history of DM, Htn, HLD, CKD- had gradually worsening insulin resistance- and his endocrinologist is increasing doses of insulin and now  [place].   as per him recently he was taking 150 U daily. Still for last week his blood sugar was high. He denies any infection. Today felt nauseated and weak. In ER noted to have blood sugar > 500, so suggested to admit to achieve bl sugar control.  PAST MEDICAL HISTORY:   Past Medical History:  Diagnosis Date  . Cellulitis   . Diabetes mellitus without complication ([place])   . Heart palpitations   . HLD (hyperlipidemia)   . Hypertension     PAST SURGICAL HISTORY: Past Surgical History:  Procedure Laterality Date  . AMPUTATION Left 03/16/2016   Procedure: AMPUTATION DIGIT LEFT HALLUX;  Surgeon: Trula Slade, DPM;  Location: [facility];  Service: Podiatry;  Laterality: Left;  can start around 5   . ARTHROSCOPIC REPAIR ACL    . COLONOSCOPY WITH PROPOFOL N/A 10/28/2015   Procedure: COLONOSCOPY WITH PROPOFOL;  Surgeon: Lollie Sails, MD;  [facility];  Service: Endoscopy;  Laterality: N/A;  . COLONOSCOPY WITH PROPOFOL N/A 10/29/2015   Procedure: COLONOSCOPY WITH PROPOFOL;  Surgeon: Lollie Sails, MD;  Location: [facility] ENDOSCOPY;  Service: Endoscopy;  Laterality: N/A;  . PROSTATE SURGERY  2016  . TONSILECTOMY/ADENOIDECTOMY WITH MYRINGOTOMY    . TONSILLECTOMY      SOCIAL HISTORY:  Social History  Substance Use Topics  . Smoking status: Never Smoker  . Smokeless tobacco: Never Used  . Alcohol use No    FAMILY  HISTORY:  Family History  Problem Relation Age of Onset  . CAD Father   . Diabetes Mellitus II Mother     DRUG ALLERGIES:  Allergies  Allergen Reactions  . Levaquin [Levofloxacin] Swelling  . Phenergan [Promethazine Hcl] Diarrhea    REVIEW OF SYSTEMS:   CONSTITUTIONAL: No fever, fatigue or weakness.  EYES: No blurred or double vision.  EARS, NOSE, AND THROAT: No tinnitus or ear pain.  RESPIRATORY: No cough, shortness of breath, wheezing or hemoptysis.  CARDIOVASCULAR: No chest pain, orthopnea, edema.  GASTROINTESTINAL: No nausea, vomiting, diarrhea or abdominal pain.  GENITOURINARY: No dysuria, hematuria.  ENDOCRINE: No polyuria, nocturia,  HEMATOLOGY: No anemia, easy bruising or bleeding SKIN: No rash or lesion. MUSCULOSKELETAL: No joint pain or arthritis.   NEUROLOGIC: No tingling, numbness, weakness.  PSYCHIATRY: No anxiety or depression.   MEDICATIONS AT HOME:  Prior to Admission medications   Medication Sig Start Date End Date Taking? Authorizing Provider  amLODipine (NORVASC) 5 MG tablet Take 5 mg by mouth daily. 05/26/16  Yes [provider]  metFORMIN (GLUCOPHAGE-XR) 500 MG 24 hr tablet Take 500 mg by mouth 2 (two) times daily. 04/20/16  Yes [provider]  carvedilol (COREG) 6.25 MG tablet Take 1 tablet (6.25 mg total) by mouth 2 (two) times daily with a meal. Patient not taking: Reported on 07/08/2016 06/28/15   Dana Allan I, MD  HYDROcodone-acetaminophen (NORCO/VICODIN) 5-325 MG tablet Take 1 tablet by  mouth 2 (two) times daily as needed. Patient not taking: Reported on 07/08/2016 03/17/16   Damita Lack, MD  insulin aspart (NOVOLOG) 100 UNIT/ML injection Inject 4 Units into the skin 3 (three) times daily before meals. Patient not taking: Reported on 07/08/2016 03/10/15   Fritzi Mandes, MD  insulin glargine (LANTUS) 100 UNIT/ML injection Inject 0.2 mLs (20 Units total) into the skin 2 (two) times daily. Patient not taking: Reported on 07/08/2016  06/28/15   Dana Allan I, MD  ondansetron (ZOFRAN ODT) 4 MG disintegrating tablet Take 1 tablet (4 mg total) by mouth every 8 (eight) hours as needed for nausea or vomiting. Patient not taking: Reported on 07/08/2016 03/17/16   Damita Lack, MD  TRESIBA FLEXTOUCH 200 UNIT/ML SOPN Inject 0-150 Units into the skin daily. Inject up to 150 Units into the skin daily. 07/02/16   [provider]      PHYSICAL EXAMINATION:   VITAL SIGNS: Blood pressure 125/80, pulse 97, temperature 98 F (36.7 C), resp. rate 16, height 6\' 3"  (1.905 m), weight 103.4 kg (228 lb), SpO2 96 %.  GENERAL:  46 y.o.-year-old patient lying in the bed with no acute distress.  EYES: Pupils equal, round, reactive to light and accommodation. No scleral icterus. Extraocular muscles intact.  HEENT: Head atraumatic, normocephalic. Oropharynx and nasopharynx clear.  NECK:  Supple, no jugular venous distention. No thyroid enlargement, no tenderness.  LUNGS: Normal breath sounds bilaterally, no wheezing, rales,rhonchi or crepitation. No use of accessory muscles of respiration.  CARDIOVASCULAR: S1, S2 normal. No murmurs, rubs, or gallops.  ABDOMEN: Soft, nontender, nondistended. Bowel sounds present. No organomegaly or mass.  EXTREMITIES: No pedal edema, cyanosis, or clubbing.  NEUROLOGIC: Cranial nerves II through XII are intact. Muscle strength 5/5 in all extremities. Sensation intact. Gait not checked.  PSYCHIATRIC: The patient is alert and oriented x 3.  SKIN: No obvious rash, lesion, or ulcer.   LABORATORY PANEL:   CBC  Recent Labs Lab 07/08/16 1824  WBC 7.9  HGB 11.8*  HCT 37.4*  PLT 267  MCV 70.6*  MCH 22.2*  MCHC 31.5*  RDW 14.3   ------------------------------------------------------------------------------------------------------------------  Chemistries   Recent Labs Lab 07/08/16 1824  NA 130*  K 3.9  CL 95*  CO2 28  GLUCOSE 574*  BUN 35*  CREATININE 1.76*  CALCIUM 8.8*    ------------------------------------------------------------------------------------------------------------------ estimated creatinine clearance is 66.9 mL/min (A) (by C-G formula based on SCr of 1.76 mg/dL (H)). ------------------------------------------------------------------------------------------------------------------ No results for input(s): TSH, T4TOTAL, T3FREE, THYROIDAB in the last 72 hours.  Invalid input(s): FREET3   Coagulation profile No results for input(s): INR, PROTIME in the last 168 hours. ------------------------------------------------------------------------------------------------------------------- No results for input(s): DDIMER in the last 72 hours. -------------------------------------------------------------------------------------------------------------------  Cardiac Enzymes No results for input(s): CKMB, TROPONINI, MYOGLOBIN in the last 168 hours.  Invalid input(s): CK ------------------------------------------------------------------------------------------------------------------ Invalid input(s): POCBNP  ---------------------------------------------------------------------------------------------------------------  Urinalysis    Component Value Date/Time   COLORURINE YELLOW 01/23/2016 2110   APPEARANCEUR CLEAR 01/23/2016 2110   LABSPEC 1.028 01/23/2016 2110   PHURINE 5.0 01/23/2016 2110   GLUCOSEU >1000 (A) 01/23/2016 2110   HGBUR TRACE (A) 01/23/2016 2110   BILIRUBINUR NEGATIVE 01/23/2016 2110   [place] NEGATIVE 01/23/2016 2110   PROTEINUR 100 (A) 01/23/2016 2110   NITRITE NEGATIVE 01/23/2016 2110   LEUKOCYTESUR NEGATIVE 01/23/2016 2110     RADIOLOGY: No results found.  EKG: Orders placed or performed during the hospital encounter of 06/23/15  . EKG 12-Lead  . EKG 12-Lead  . EKG 12-Lead  .  EKG 12-Lead    IMPRESSION AND PLAN:  * Uncontrolled DM   tersiba 170 U    Will keep on ISS.   He have appointment with his  endocrinologist next week.  * Htn   Cont home meds  * CKD stage 3    Monitor.  All the records are reviewed and case discussed with ED provider. Management plans discussed with the patient, family and they are in agreement.  CODE STATUS: Full Code Status History    Date Active Date Inactive Code Status Order ID Comments User Context   03/15/2016 10:07 PM 03/17/2016  5:53 PM Full Code [id number]  Etta Quill, DO ED   06/23/2015  9:39 PM 06/28/2015  5:33 PM Full Code [id number]  Ivor Costa, MD ED   03/07/2015  1:33 AM 03/10/2015  9:07 PM Full Code [id number]  Harrie Foreman, MD Inpatient       TOTAL TIME TAKING CARE OF THIS PATIENT: 50 minutes.    Vaughan Basta M.D on 07/08/2016   Between 7am to 6pm - Pager - [phone]  After 6pm go to [email or web address] - password [email or web address] [facility]  Office  [phone]  CC: Primary care physician; [facility]   Note: This dictation was prepared with Dragon dictation along with smaller phrase technology. Any transcriptional errors that result from this process are unintentional.

## 2016-07-08 NOTE — ED Notes (Signed)
Attempted to call report.  Ortho unable to take patient at this time due to being on an insulin drip.

## 2016-07-08 NOTE — ED Notes (Signed)
Rate changed verified by Delilah Shan, RN.

## 2016-07-08 NOTE — ED Notes (Signed)
Charge nurse notified of glucose, will take pt to next available exam room

## 2016-07-08 NOTE — ED Notes (Signed)
Admitting MD at bedside.

## 2016-07-08 NOTE — ED Provider Notes (Signed)
[facility] Emergency Department Provider Note       Time seen: ----------------------------------------- 7:49 PM on 07/08/2016 -----------------------------------------     I have reviewed the triage vital signs and the nursing notes.   HISTORY   Chief Complaint Nausea    HPI [name]. is a 46 y.o. male who presents to the ED for high blood sugars. Patient reports his blood sugars have been persistently high despite recent changes by his endocrinologist. He takes [place] and Novolog and has been taking these for some time. One week ago the [place] was increased by 50 a day and the NovoLog was increased by 60 units per day without any improvement in his blood sugars. He has felt weak and tired with nausea. Nothing makes his symptoms better. He describes adhering to a diabetic diet.   Past Medical History:  Diagnosis Date  . Cellulitis   . Diabetes mellitus without complication ([place])   . Heart palpitations   . HLD (hyperlipidemia)   . Hypertension     Patient Active Problem List   Diagnosis Date Noted  . Osteomyelitis of toe of left foot ([place]) 03/15/2016  . Cellulitis, face 06/24/2015  . Facial cellulitis 06/23/2015  . AKI (acute kidney injury) ([place]) 06/23/2015  . Diarrhea 06/23/2015  . Diabetes mellitus without complication ([place])   . HLD (hyperlipidemia)   . Essential hypertension   . Sepsis ([place]) 03/07/2015    Past Surgical History:  Procedure Laterality Date  . AMPUTATION Left 03/16/2016   Procedure: AMPUTATION DIGIT LEFT HALLUX;  Surgeon: Trula Slade, DPM;  Location: [place];  Service: Podiatry;  Laterality: Left;  can start around 5   . ARTHROSCOPIC REPAIR ACL    . COLONOSCOPY WITH PROPOFOL N/A 10/28/2015   Procedure: COLONOSCOPY WITH PROPOFOL;  Surgeon: Lollie Sails, MD;  Location[facility] ENDOSCOPY;  Service: Endoscopy;  Laterality: N/A;  . COLONOSCOPY WITH PROPOFOL N/A 10/29/2015   Procedure: COLONOSCOPY WITH PROPOFOL;   Surgeon: Lollie Sails, MD;  Location: [facility] ENDOSCOPY;  Service: Endoscopy;  Laterality: N/A;  . PROSTATE SURGERY  2016  . TONSILECTOMY/ADENOIDECTOMY WITH MYRINGOTOMY    . TONSILLECTOMY      Allergies Levaquin [levofloxacin] and Phenergan [promethazine hcl]  Social History Social History  Substance Use Topics  . Smoking status: Never Smoker  . Smokeless tobacco: Never Used  . Alcohol use No    Review of Systems Constitutional: Negative for fever. Eyes: Negative for vision changes ENT:  Negative for congestion, sore throat Cardiovascular: Negative for chest pain. Respiratory: Negative for shortness of breath. Gastrointestinal: Negative for abdominal pain, positive for nausea Genitourinary: Negative for dysuria. Musculoskeletal: Negative for back pain. Skin: Negative for rash. Neurological: Negative for headaches, positive for generalized weakness  All systems negative/normal/unremarkable except as stated in the HPI  ____________________________________________   PHYSICAL EXAM:  VITAL SIGNS: ED Triage Vitals [07/08/16 1815]  Enc Vitals Group     BP 125/80     Pulse Rate 97     Resp 16     Temp 98 F (36.7 C)     Temp src      SpO2 96 %     Weight 228 lb (103.4 kg)     Height 6\' 3"  (1.905 m)     Head Circumference      Peak Flow      Pain Score      Pain Loc      Pain Edu?      Excl. in [place]?  Constitutional: Alert and oriented. Well appearing and in no distress. Eyes: Conjunctivae are normal. PERRL. Normal extraocular movements. ENT   Head: Normocephalic and atraumatic.   Nose: No congestion/rhinnorhea.   Mouth/Throat: Mucous membranes are moist.   Neck: No stridor. Cardiovascular: Normal rate, regular rhythm. No murmurs, rubs, or gallops. Respiratory: Normal respiratory effort without tachypnea nor retractions. Breath sounds are clear and equal bilaterally. No wheezes/rales/rhonchi. Gastrointestinal: Soft and nontender. Normal bowel  sounds Musculoskeletal: Nontender with normal range of motion in extremities. No lower extremity tenderness nor edema. Neurologic:  Normal speech and language. No gross focal neurologic deficits are appreciated.  Skin:  Skin is warm, dry and intact. No rash noted. Psychiatric: Mood and affect are normal. Speech and behavior are normal.  ____________________________________________  ED COURSE:  Pertinent labs & imaging results that were available during my care of the patient were reviewed by me and considered in my medical decision making (see chart for details). Patient presents for hyperglycemia and nausea, we will assess with labs as indicated. Patient will be started on IV fluids and received likely IV insulin.   Procedures ____________________________________________   LABS (pertinent positives/negatives)  Labs Reviewed  GLUCOSE, CAPILLARY - Abnormal; Notable for the following:       Result Value   Glucose-Capillary 522 (*)    All other components within normal limits  BASIC METABOLIC PANEL - Abnormal; Notable for the following:    Sodium 130 (*)    Chloride 95 (*)    Glucose, Bld 574 (*)    BUN 35 (*)    Creatinine, Ser 1.76 (*)    Calcium 8.8 (*)    GFR calc non Af Amer 44 (*)    GFR calc Af Amer 51 (*)    All other components within normal limits  CBC - Abnormal; Notable for the following:    Hemoglobin 11.8 (*)    HCT 37.4 (*)    MCV 70.6 (*)    MCH 22.2 (*)    MCHC 31.5 (*)    All other components within normal limits  URINALYSIS, COMPLETE (UACMP) WITH MICROSCOPIC  BETA-HYDROXYBUTYRIC ACID  CBG MONITORING, ED  ____________________________________________  FINAL ASSESSMENT AND PLAN  Hyperglycemic, hyperosmolar state  Plan: Patient's labs and imaging were dictated above. Patient had presented for persistent hyperglycemia despite outpatient changes by his endocrinologist. I don't see any safe way of adjusting his insulin and ensuring normoglycemia outside of  the hospital. We have started a continuous insulin infusion as well as IV fluids and will discuss with the hospitalist for admission.   Earleen Newport, MD   Note: This note was generated in part or whole with voice recognition software. Voice recognition is usually quite accurate but there are transcription errors that can and very often do occur. I apologize for any typographical errors that were not detected and corrected.     Earleen Newport, MD 07/08/16 2005

## 2016-07-09 DIAGNOSIS — Z7984 Long term (current) use of oral hypoglycemic drugs: Secondary | ICD-10-CM | POA: Diagnosis not present

## 2016-07-09 DIAGNOSIS — R739 Hyperglycemia, unspecified: Secondary | ICD-10-CM | POA: Diagnosis present

## 2016-07-09 DIAGNOSIS — E1165 Type 2 diabetes mellitus with hyperglycemia: Secondary | ICD-10-CM | POA: Diagnosis present

## 2016-07-09 DIAGNOSIS — E8881 Metabolic syndrome: Secondary | ICD-10-CM | POA: Diagnosis not present

## 2016-07-09 DIAGNOSIS — E1122 Type 2 diabetes mellitus with diabetic chronic kidney disease: Secondary | ICD-10-CM | POA: Diagnosis present

## 2016-07-09 DIAGNOSIS — I129 Hypertensive chronic kidney disease with stage 1 through stage 4 chronic kidney disease, or unspecified chronic kidney disease: Secondary | ICD-10-CM | POA: Diagnosis present

## 2016-07-09 DIAGNOSIS — Z794 Long term (current) use of insulin: Secondary | ICD-10-CM | POA: Diagnosis not present

## 2016-07-09 DIAGNOSIS — Z8249 Family history of ischemic heart disease and other diseases of the circulatory system: Secondary | ICD-10-CM | POA: Diagnosis not present

## 2016-07-09 DIAGNOSIS — Z79899 Other long term (current) drug therapy: Secondary | ICD-10-CM | POA: Diagnosis not present

## 2016-07-09 DIAGNOSIS — Z833 Family history of diabetes mellitus: Secondary | ICD-10-CM | POA: Diagnosis not present

## 2016-07-09 DIAGNOSIS — N183 Chronic kidney disease, stage 3 (moderate): Secondary | ICD-10-CM | POA: Diagnosis present

## 2016-07-09 LAB — BASIC METABOLIC PANEL
Anion gap: 6 (ref 5–15)
BUN: 28 mg/dL — AB (ref 6–20)
CHLORIDE: 103 mmol/L (ref 101–111)
CO2: 30 mmol/L (ref 22–32)
Calcium: 9 mg/dL (ref 8.9–10.3)
Creatinine, Ser: 1.36 mg/dL — ABNORMAL HIGH (ref 0.61–1.24)
Glucose, Bld: 169 mg/dL — ABNORMAL HIGH (ref 65–99)
Potassium: 3.3 mmol/L — ABNORMAL LOW (ref 3.5–5.1)
SODIUM: 139 mmol/L (ref 135–145)

## 2016-07-09 LAB — URINALYSIS, COMPLETE (UACMP) WITH MICROSCOPIC
Bacteria, UA: NONE SEEN
Bilirubin Urine: NEGATIVE
KETONES UR: NEGATIVE mg/dL
LEUKOCYTES UA: NEGATIVE
Nitrite: NEGATIVE
Protein, ur: 100 mg/dL — AB
Specific Gravity, Urine: 1.018 (ref 1.005–1.030)
pH: 5 (ref 5.0–8.0)

## 2016-07-09 LAB — GLUCOSE, CAPILLARY
GLUCOSE-CAPILLARY: 216 mg/dL — AB (ref 65–99)
GLUCOSE-CAPILLARY: 172 mg/dL — AB (ref 65–99)
GLUCOSE-CAPILLARY: 173 mg/dL — AB (ref 65–99)
GLUCOSE-CAPILLARY: 163 mg/dL — AB (ref 65–99)
GLUCOSE-CAPILLARY: 177 mg/dL — AB (ref 65–99)
GLUCOSE-CAPILLARY: 238 mg/dL — AB (ref 65–99)
Glucose-Capillary: 162 mg/dL — ABNORMAL HIGH (ref 65–99)
Glucose-Capillary: 175 mg/dL — ABNORMAL HIGH (ref 65–99)
Glucose-Capillary: 212 mg/dL — ABNORMAL HIGH (ref 65–99)

## 2016-07-09 LAB — CBC
HCT: 34.7 % — ABNORMAL LOW (ref 40.0–52.0)
Hemoglobin: 11.4 g/dL — ABNORMAL LOW (ref 13.0–18.0)
MCH: 22.6 pg — ABNORMAL LOW (ref 26.0–34.0)
MCHC: 32.8 g/dL (ref 32.0–36.0)
MCV: 68.9 fL — AB (ref 80.0–100.0)
PLATELETS: 252 10*3/uL (ref 150–440)
RBC: 5.04 MIL/uL (ref 4.40–5.90)
RDW: 13.6 % (ref 11.5–14.5)
WBC: 7.4 10*3/uL (ref 3.8–10.6)

## 2016-07-09 LAB — MRSA PCR SCREENING: MRSA BY PCR: NEGATIVE

## 2016-07-09 MED ORDER — LISINOPRIL 10 MG PO TABS: 10.0000 mg | ORAL_TABLET | Freq: Every day | ORAL | 0 refills | Status: DC

## 2016-07-09 MED ORDER — ACETAMINOPHEN 325 MG PO TABS
650.0000 mg | ORAL_TABLET | Freq: Four times a day (QID) | ORAL | Status: DC | PRN
  Administered 2016-07-09: 650 mg via ORAL
  Filled 2016-07-09: qty 2

## 2016-07-09 MED ORDER — ONDANSETRON HCL 4 MG/2ML IJ SOLN
4.0000 mg | Freq: Four times a day (QID) | INTRAMUSCULAR | Status: DC | PRN
  Administered 2016-07-09: 4 mg via INTRAVENOUS

## 2016-07-09 MED ORDER — ONDANSETRON HCL 4 MG/2ML IJ SOLN
INTRAMUSCULAR | Status: AC
  Filled 2016-07-09: qty 2

## 2016-07-09 MED ORDER — INSULIN GLARGINE 100 UNIT/ML ~~LOC~~ SOLN
170.0000 [IU] | Freq: Every day | SUBCUTANEOUS | Status: DC
  Filled 2016-07-09: qty 1.7

## 2016-07-09 MED ORDER — CARVEDILOL 6.25 MG PO TABS
6.2500 mg | ORAL_TABLET | Freq: Two times a day (BID) | ORAL | Status: DC
  Administered 2016-07-09: 6.25 mg via ORAL
  Filled 2016-07-09: qty 1

## 2016-07-09 MED ORDER — LISINOPRIL 10 MG PO TABS
10.0000 mg | ORAL_TABLET | Freq: Every day | ORAL | Status: DC
  Administered 2016-07-09: 10 mg via ORAL
  Filled 2016-07-09: qty 1

## 2016-07-09 MED ORDER — INSULIN ASPART 100 UNIT/ML ~~LOC~~ SOLN: 2.0000 [IU] | SUBCUTANEOUS | Status: DC

## 2016-07-09 MED ORDER — AMLODIPINE BESYLATE 5 MG PO TABS
5.0000 mg | ORAL_TABLET | Freq: Every day | ORAL | Status: DC
  Administered 2016-07-09: 5 mg via ORAL
  Filled 2016-07-09: qty 1

## 2016-07-09 MED ORDER — METFORMIN HCL ER 500 MG PO TB24
500.0000 mg | ORAL_TABLET | Freq: Two times a day (BID) | ORAL | Status: DC
  Filled 2016-07-09 (×2): qty 1

## 2016-07-09 MED ORDER — HEPARIN SODIUM (PORCINE) 5000 UNIT/ML IJ SOLN
5000.0000 [IU] | Freq: Three times a day (TID) | INTRAMUSCULAR | Status: DC
  Administered 2016-07-09: 5000 [IU] via SUBCUTANEOUS
  Filled 2016-07-09: qty 1

## 2016-07-09 MED ORDER — INSULIN ASPART 100 UNIT/ML ~~LOC~~ SOLN
2.0000 [IU] | Freq: Three times a day (TID) | SUBCUTANEOUS | Status: DC
  Administered 2016-07-09: 6 [IU] via SUBCUTANEOUS
  Administered 2016-07-09: 4 [IU] via SUBCUTANEOUS
  Filled 2016-07-09: qty 4
  Filled 2016-07-09: qty 6

## 2016-07-09 MED ORDER — HYDROCODONE-ACETAMINOPHEN 5-325 MG PO TABS: 1.0000 | ORAL_TABLET | Freq: Four times a day (QID) | ORAL | Status: DC | PRN

## 2016-07-09 MED ORDER — INSULIN DEGLUDEC 200 UNIT/ML ~~LOC~~ SOPN: 170.0000 [IU] | PEN_INJECTOR | Freq: Every day | SUBCUTANEOUS | Status: DC

## 2016-07-09 MED ORDER — INSULIN GLARGINE 100 UNIT/ML ~~LOC~~ SOLN
40.0000 [IU] | SUBCUTANEOUS | Status: DC
  Administered 2016-07-09: 40 [IU] via SUBCUTANEOUS
  Filled 2016-07-09: qty 0.4

## 2016-07-09 MED ORDER — DEXTROSE 10 % IV SOLN: INTRAVENOUS | Status: DC | PRN

## 2016-07-09 MED ORDER — POTASSIUM CHLORIDE CRYS ER 20 MEQ PO TBCR
40.0000 meq | EXTENDED_RELEASE_TABLET | Freq: Once | ORAL | Status: AC
  Administered 2016-07-09: 40 meq via ORAL
  Filled 2016-07-09: qty 2

## 2016-07-09 MED ORDER — DOCUSATE SODIUM 100 MG PO CAPS: 100.0000 mg | ORAL_CAPSULE | Freq: Two times a day (BID) | ORAL | Status: DC | PRN

## 2016-07-09 NOTE — Progress Notes (Signed)
Pt provided with education on importance of making and keeping Endocrinolofy appointment. Also provided education on importance of controlling blood sugar levels, and blood pressure. Pt verbalized understanding and agreed to make appointment with endocrinology next week.  Pt transported to vehicle by [employer] in stable condition. All belongings with patient. Pt being transported ome by son.

## 2016-07-09 NOTE — Progress Notes (Deleted)
DISCHARGE SUMMARY  Name: [name]. MRN: [id number] DOB: [birth year]    ADMISSION DATE:  07/08/2016  REFERRING MD :  Dr. Jannifer Franklin  CHIEF COMPLAINT:  Nausea  BRIEF PATIENT DESCRIPTION: 46 year old male  with worsening Insulin Resistance ,admitted to achieve blood sugars under control on Insulin gtt.  SIGNIFICANT EVENTS  5/18 Patient admitted to the ICU on insulin gtt-transitioned off insulin drip  Patient feels well, no acute issues prior to discharge, patient states that he feels well enough to go home Insulin regimen discussed with patient.   Last FSBS 238 Patient to follow up at [place] :   has a past medical history of Cellulitis; Diabetes mellitus without complication ([place]); Heart palpitations; HLD (hyperlipidemia); and Hypertension.  has a past surgical history that includes Arthroscopic repair ACL; Tonsilectomy/adenoidectomy with myringotomy; Prostate surgery (2016); Tonsillectomy; Colonoscopy with propofol (N/A, 10/28/2015); Colonoscopy with propofol (N/A, 10/29/2015); and Amputation (Left, 03/16/2016).    DISCHARGE MEDS Prior to Admission medications   Medication Sig Start Date End Date Taking? Authorizing Provider  amLODipine (NORVASC) 5 MG tablet Take 5 mg by mouth daily. 05/26/16  Yes [provider]  metFORMIN (GLUCOPHAGE-XR) 500 MG 24 hr tablet Take 500 mg by mouth 2 (two) times daily. 04/20/16  Yes [provider]  carvedilol (COREG) 6.25 MG tablet Take 1 tablet (6.25 mg total) by mouth 2 (two) times daily with a meal. Patient not taking: Reported on 07/08/2016 06/28/15   Dana Allan I, MD  HYDROcodone-acetaminophen (NORCO/VICODIN) 5-325 MG tablet Take 1 tablet by mouth 2 (two) times daily as needed. Patient not taking: Reported on 07/08/2016 03/17/16   Damita Lack, MD  insulin aspart (NOVOLOG) 100 UNIT/ML injection Inject 4 Units into the skin 3 (three) times daily before meals. Patient not  taking: Reported on 07/08/2016 03/10/15   Fritzi Mandes, MD  insulin glargine (LANTUS) 100 UNIT/ML injection Inject 0.2 mLs (20 Units total) into the skin 2 (two) times daily. Patient not taking: Reported on 07/08/2016 06/28/15   Dana Allan I, MD  ondansetron (ZOFRAN ODT) 4 MG disintegrating tablet Take 1 tablet (4 mg total) by mouth every 8 (eight) hours as needed for nausea or vomiting. Patient not taking: Reported on 07/08/2016 03/17/16   Damita Lack, MD  TRESIBA FLEXTOUCH 200 UNIT/ML SOPN Inject 0-150 Units into the skin daily. Inject up to 150 Units into the skin daily. 07/02/16   [provider]   Allergies  Allergen Reactions  . Levaquin [Levofloxacin] Swelling  . Phenergan [Promethazine Hcl] Diarrhea    VITAL SIGNS: Temp:  [98 F (36.7 C)-98.4 F (36.9 C)] 98.4 F (36.9 C) (05/18 1200) Pulse Rate:  [77-97] 81 (05/18 1200) Resp:  [11-30] 12 (05/18 1200) BP: (123-168)/(74-131) 163/106 (05/18 1100) SpO2:  [95 %-100 %] 100 % (05/18 1200) Weight:  [228 lb (103.4 kg)] 228 lb (103.4 kg) (05/17 1815)  PHYSICAL EXAMINATION: General:  Middle aged male, in no acute distress  Neuro:  Awake, Alert and oriented  HEENT:  AT,[place],No jvd, PERRLA Cardiovascular: S1S2, Regular, no m/r/g Lungs:  Clear bilaterally, no wheezes, crackles, rhonchi noted Abdomen:  Soft, NT,ND Musculoskeletal:  No edema,cyanosis noted Skin: warm, dry and intact.   Recent Labs Lab 07/08/16 1824 07/09/16 0356  NA 130* 139  K 3.9 3.3*  CL 95* 103  CO2 28 30  BUN 35* 28*  CREATININE 1.76* 1.36*  GLUCOSE 574* 169*    Recent Labs Lab 07/08/16 1824 07/09/16  0356  HGB 11.8* 11.4*  HCT 37.4* 34.7*  WBC 7.9 7.4  PLT 267 252   No results found.  Ok to go home. Patient and ICU staff agree with plan of care. VS reviewed and stable   Patient satisfied with Plan of action and management. All questions answered  Corrin Parker, M.D.  [facility]  Medical  Director [place] Director [facility] Cardio-Pulmonary Department

## 2016-07-09 NOTE — Progress Notes (Signed)
Name: Bing Duffey. MRN: [id number] DOB: 09-Dec-1970    ADMISSION DATE:  07/08/2016  REFERRING MD :  Dr. Jannifer Franklin  CHIEF COMPLAINT:  Nausea  BRIEF PATIENT DESCRIPTION: 46 year old male  with worsening Insulin Resistance ,admitted to achieve blood sugars under control on Insulin gtt.  SIGNIFICANT EVENTS  5/18 Patient admitted to the ICU on insulin gtt  STUDIES:  None   HISTORY OF PRESENT ILLNESS:  Dyan Creelman is a 46 year old male with Known history of DM,HTN,HLD,CKD.  Patient had gradually worsening of insulin resistance.  Patient's sees an endocrinologist who has been increasing his insulin without much improvement in blood sugar.  Now the patient is on [place]. Patient presented to ED on 5/17 with complaints of nausea and his blood glucose was noted to be 500mg /dl. Therefore patient was admitted  To get a better control on his blood glucose.  Patient was send to the ICU as the patient is on insulin gtt.  PAST MEDICAL HISTORY :   has a past medical history of Cellulitis; Diabetes mellitus without complication ([place]); Heart palpitations; HLD (hyperlipidemia); and Hypertension.  has a past surgical history that includes Arthroscopic repair ACL; Tonsilectomy/adenoidectomy with myringotomy; Prostate surgery (2016); Tonsillectomy; Colonoscopy with propofol (N/A, 10/28/2015); Colonoscopy with propofol (N/A, 10/29/2015); and Amputation (Left, 03/16/2016). Prior to Admission medications   Medication Sig Start Date End Date Taking? Authorizing Provider  amLODipine (NORVASC) 5 MG tablet Take 5 mg by mouth daily. 05/26/16  Yes [provider]  metFORMIN (GLUCOPHAGE-XR) 500 MG 24 hr tablet Take 500 mg by mouth 2 (two) times daily. 04/20/16  Yes [provider]  carvedilol (COREG) 6.25 MG tablet Take 1 tablet (6.25 mg total) by mouth 2 (two) times daily with a meal. Patient not taking: Reported on 07/08/2016 06/28/15   Dana Allan I, MD  HYDROcodone-acetaminophen  (NORCO/VICODIN) 5-325 MG tablet Take 1 tablet by mouth 2 (two) times daily as needed. Patient not taking: Reported on 07/08/2016 03/17/16   Damita Lack, MD  insulin aspart (NOVOLOG) 100 UNIT/ML injection Inject 4 Units into the skin 3 (three) times daily before meals. Patient not taking: Reported on 07/08/2016 03/10/15   Fritzi Mandes, MD  insulin glargine (LANTUS) 100 UNIT/ML injection Inject 0.2 mLs (20 Units total) into the skin 2 (two) times daily. Patient not taking: Reported on 07/08/2016 06/28/15   Dana Allan I, MD  ondansetron (ZOFRAN ODT) 4 MG disintegrating tablet Take 1 tablet (4 mg total) by mouth every 8 (eight) hours as needed for nausea or vomiting. Patient not taking: Reported on 07/08/2016 03/17/16   Damita Lack, MD  TRESIBA FLEXTOUCH 200 UNIT/ML SOPN Inject 0-150 Units into the skin daily. Inject up to 150 Units into the skin daily. 07/02/16   [provider]   Allergies  Allergen Reactions  . Levaquin [Levofloxacin] Swelling  . Phenergan [Promethazine Hcl] Diarrhea    FAMILY HISTORY:  family history includes CAD in his father; Diabetes Mellitus II in his mother. SOCIAL HISTORY:  reports that he has never smoked. He has never used smokeless tobacco. He reports that he does not drink alcohol or use drugs.  REVIEW OF SYSTEMS:   Constitutional: Negative for fever, chills, weight loss, malaise/fatigue and diaphoresis.  HENT: Negative for hearing loss, ear pain, nosebleeds, congestion, sore throat, neck pain, tinnitus and ear discharge.   Eyes: Negative for blurred vision, double vision, photophobia, pain, discharge and redness.  Respiratory: Negative for cough, hemoptysis, sputum production, shortness of breath, wheezing and stridor.  Cardiovascular: Negative for chest pain, palpitations, orthopnea, claudication, leg swelling and PND.  Gastrointestinal: Negative for heartburn, nausea, vomiting, abdominal pain, diarrhea, constipation, blood in stool and  melena.  Genitourinary: Negative for dysuria, urgency, frequency, hematuria and flank pain.  Musculoskeletal: Negative for myalgias, back pain, joint pain and falls.  Skin: Negative for itching and rash.  Neurological: Negative for dizziness, tingling, tremors, sensory change, speech change, focal weakness, seizures, loss of consciousness, weakness and headaches.  Endo/Heme/Allergies: Negative for environmental allergies and polydipsia. Does not bruise/bleed easily.  SUBJECTIVE: Patient states that "he feels better, not nauseous  Any more"  VITAL SIGNS: Temp:  [98 F (36.7 C)] 98 F (36.7 C) (05/17 1815) Pulse Rate:  [77-97] 83 (05/18 0053) Resp:  [15-17] 15 (05/18 0053) BP: (123-165)/(80-96) 123/80 (05/18 0053) SpO2:  [95 %-98 %] 97 % (05/18 0053) Weight:  [228 lb (103.4 kg)] 228 lb (103.4 kg) (05/17 1815)  PHYSICAL EXAMINATION: General:  Middle aged male, in no acute distress  Neuro:  Awake, Alert and oriented  HEENT:  AT,[place],No jvd, PERRLA Cardiovascular: S1S2, Regular, no m/r/g Lungs:  Clear bilaterally, no wheezes, crackles, rhonchi noted Abdomen:  Soft, NT,ND Musculoskeletal:  No edema,cyanosis noted Skin: warm, dry and intact.   Recent Labs Lab 07/08/16 1824  NA 130*  K 3.9  CL 95*  CO2 28  BUN 35*  CREATININE 1.76*  GLUCOSE 574*    Recent Labs Lab 07/08/16 1824  HGB 11.8*  HCT 37.4*  WBC 7.9  PLT 267   No results found.  ASSESSMENT / PLAN:  Uncontrolled Diabetes Mellitus Hypertension Nausea  Continue Insulin gtt Hourly blood glucose checks Follow Hyperglycemia protocol to transition Continue Amlodipine. Zofran  Nyelah Emmerich,AG-ACNP Pulmonary and [place]   07/09/2016, 1:48 AM

## 2016-07-09 NOTE — Progress Notes (Signed)
eLink Physician-Brief Progress Note Patient Name: [name]. DOB: Jun 25, 1970 MRN: [id number]   Date of Service  07/09/2016  HPI/Events of Note  hypokalemia  eICU Interventions  Potassium replaced     Intervention Category Intermediate Interventions: Electrolyte abnormality - evaluation and management  DETERDING,ELIZABETH 07/09/2016, 6:10 AM

## 2016-07-09 NOTE — Care Management Note (Signed)
Case Management Note  Patient Details  Name: Numan Zylstra. MRN: [id number] Date of Birth: 1970-07-11  Subjective/Objective:                  Met with patient to discuss discharge planning. He was at [facility] for complicated foot wound that ended with toe amputation in January. He states he has balance issues still however he no longer requires a walker to ambulate. He states he is independent and drives. He states he is current with PCP and can afford his medications. I have offered follow up with [facility] and he appreciates. He denies needs.  Action/Plan:   Expected Discharge Date:  07/09/16               Expected Discharge Plan:     In-House Referral:     Discharge planning Services  CM Consult  Post Acute Care Choice:    Choice offered to:  Patient  DME Arranged:    DME Agency:     HH Arranged:    [place] Agency:     Status of Service:  Completed, signed off  If discussed at [employer] of Stay Meetings, dates discussed:    Additional Comments:  Marshell Garfinkel, RN 07/09/2016, 1:41 PM

## 2016-07-09 NOTE — Discharge Instructions (Signed)
Correction Insulin Correction insulin, also called corrective insulin or a supplemental dose, is a small amount of insulin that can be used to lower your blood sugar (glucose) if it is too high. You may be instructed to check your blood glucose at certain times of the day and to use correction insulin as needed to lower your blood glucose to your target range. Correction insulin is primarily used as part of diabetes management. It may also be prescribed for people who do not have diabetes. What is a correction scale? A correction scale, also called a sliding scale, is prescribed by your health care provider to help you determine when you need correction insulin. Your correction scale is based on your individual treatment goals, and it has two parts:  Ranges of blood glucose levels.  How much correction insulin to give yourself if your blood sugar falls within a certain range. If your blood glucose is in your desired range, you will not need correction insulin and you should take your normal insulin dose. What type of insulin do I need? Your health care provider may prescribe rapid-acting or short-acting insulin for you to use as correction insulin. Rapid-acting insulin:   Starts working quickly, in as little as 5 minutes.  Can last for 3-6 hours.  Works well when taken right before a meal to quickly lower blood glucose. Short-acting insulin:   Starts working in about 30 minutes.  Can last for 6-8 hours.  Should be taken about 30 minutes before you start eating a meal. Talk with your health care provider or pharmacist about which type of correction insulin to take and when to take it. If you use insulin to control your diabetes, you should use correction insulin in addition to the longer-acting (basal) insulin that you normally use. How do I manage my blood glucose with correction insulin? Giving a correction dose   Check your blood glucose as directed by your health care  provider.  Use your correction scale to find the range that your blood glucose is in.  Identify the units of insulin that match your blood glucose range.  Make sure you have food available that you can eat in the next 15-30 minutes, after your correction dose.  Give yourself the dose of correction insulin that your health care provider has prescribed in your correction scale. Always make sure you are using the right type of insulin.  If your correction insulin is rapid-acting, start eating a meal within 15 minutes after your correction dose to keep your blood glucose from getting too low.  If your correction insulin is short-acting, start eating a meal within 30 minutes after your correction dose to keep your blood glucose from getting too low. Keeping a blood glucose log   Write down your blood glucose test results and the amount of insulin that you give yourself. Do this every time you check blood glucose or take insulin. Bring this log with you to your medical visits. This information will help your health care provider to manage your medicines.  Note anything that may affect your blood glucose, such as:  Changes in normal exercise or activity.  Changes in your normal schedule, such as changes in your sleep routine, going on vacation, changing your diet, or holidays.  New over-the-counter or prescription medicines.  Illness, stress, or anxiety.  Changes in the time that you took your medicine or insulin.  Changes in your meals, such as skipping a meal, having a late meal, or dining out.  Eating things that may affect blood glucose, such as snacks, meal portions that are larger than normal, drinks that contain sugar, or eating less than usual. What do I need to know about hyperglycemia and hypoglycemia? What is hyperglycemia?  Hyperglycemia, also called high blood glucose, occurs when blood glucose is too high. Make sure you know the early signs of hyperglycemia, such  as:  Increased thirst.  Hunger.  Feeling very tired.  Needing to urinate more often than usual.  Blurry vision. What is hypoglycemia?  Hypoglycemia is also called low blood glucose. Be aware of stacking your insulin doses. This happens when you correct a high blood glucose by giving yourself extra insulin too soon after a previous correction dose or mealtime dose. This may cause you to have too much insulin in your body and may put you at risk for hypoglycemia. Hypoglycemia occurs with a blood glucose level at or below 70 mg/dL (3.9 mmol/L). It is important to know the symptoms of hypoglycemia and treat it right away. Always have a 15-gram rapid-acting carbohydrate snack with you to treat low blood glucose. Family members and close friends should also know the symptoms and should understand how to treat hypoglycemia, in case you are not able to treat yourself. What are the symptoms of hypoglycemia?  Hypoglycemia symptoms can include:  Hunger.  Anxiety.  Sweating and feeling clammy.  Confusion.  Dizziness or light-headedness.  Sleepiness.  Nausea.  Increased heart rate.  Headache.  Blurry vision.  Jerky movements that you cannot control (seizure).  Nightmares.  Tingling or numbness around the mouth, lips, or tongue.  A change in speech.  Decreased ability to concentrate.  A change in coordination.  Restless sleep.  Tremors or shakes.  Fainting.  Irritability. How do I treat hypoglycemia?  If you are alert and able to swallow safely, follow the 15:15 rule:  Take 15 grams of a rapid-acting carbohydrate. Rapid-acting options include:  1 tube of glucose gel.  3 glucose pills.  6-8 pieces of hard candy.  4 oz (120 mL) of fruit juice.  4 oz (120 mL) of regular (not diet) soda.  Check your blood glucose 15 minutes after you take the carbohydrate.  If the repeat blood glucose level is still at or below 70 mg/dL (3.9 mmol/L), take 15 grams of a  carbohydrate again.  If your blood glucose level does not increase above 70 mg/dL (3.9 mmol/L) after 3 tries, seek emergency medical care.  After your blood glucose level returns to normal, eat a meal or a snack within 1 hour. How do I treat severe hypoglycemia?  Severe hypoglycemia is when your blood glucose level is at or below 54 mg/dL (3 mmol/L). Severe hypoglycemia is an emergency. Do not wait to see if the symptoms will go away. Get medical help right away. Call your local emergency services (911 in the [place] Do not drive yourself to the hospital. If you have severe hypoglycemia and you cannot eat or drink, you may need an injection of glucagon. A family member or close friend should learn how to check your blood glucose and how to give you a glucagon injection. Ask your health care provider if you need to have an emergency glucagon injection kit available. Severe hypoglycemia may need to be treated in a hospital. The treatment may include getting glucose through an IV tube. You may also need treatment for the cause of your hypoglycemia. Why do I need correction insulin if I do not have diabetes? If you  do not have diabetes, your health care provider may prescribe insulin because:  Keeping your blood glucose in the target range is important for your overall health.  You are taking medicines that cause your blood glucose to be higher than normal. Contact a health care provider if:  You develop low blood glucose that you are not able to treat yourself.  You have high blood glucose that you are not able to correct with correction insulin.  Your blood glucose is often too low.  You used emergency glucagon to treat low blood glucose. Get help right away if:  You become unresponsive. If this happens, someone else should call emergency services (911 in the [place] right away.  Your blood glucose is lower than 54 mg/dL (3.0 mmol/L).  You become confused or you have trouble thinking  clearly.  You have difficulty breathing. Summary  Correction insulin is a small amount of insulin that can be used to lower your blood sugar (glucose) if it is too high.  Talk with your health care provider or pharmacist about which type of correction insulin to take and when to take it. If you use insulin to control your diabetes, you should use correction insulin in addition to the longer-acting (basal) insulin that you normally use.  You may be instructed to check your blood glucose at certain times of the day and to use correction insulin as needed to lower your blood glucose to your target range. Always keep a log of your blood glucose values and the amount of insulin that you used.  It is important to know the symptoms of hypoglycemia and treat it right away. Always have a 15-gram rapid-acting carbohydrate snack with you to treat low blood glucose. Family members and close friends should also know the symptoms and should understand how to treat hypoglycemia, in case you are not able to treat yourself. This information is not intended to replace advice given to you by your health care provider. Make sure you discuss any questions you have with your health care provider. Document Released: 07/02/2010 Document Revised: 11/07/2015 Document Reviewed: 11/07/2015 Elsevier Interactive Patient Education  2017 [place].   Diabetes Mellitus and Exercise Exercising regularly is important for your overall health, especially when you have diabetes (diabetes mellitus). Exercising is not only about losing weight. It has many health benefits, such as increasing muscle strength and bone density and reducing body fat and stress. This leads to improved fitness, flexibility, and endurance, all of which result in better overall health. Exercise has additional benefits for people with diabetes, including:  Reducing appetite.  Helping to lower and control blood glucose.  Lowering blood  pressure.  Helping to control amounts of fatty substances (lipids) in the blood, such as cholesterol and triglycerides.  Helping the body to respond better to insulin (improving insulin sensitivity).  Reducing how much insulin the body needs.  Decreasing the risk for heart disease by:  Lowering cholesterol and triglyceride levels.  Increasing the levels of good cholesterol.  Lowering blood glucose levels. What is my activity plan? Your health care provider or certified diabetes educator can help you make a plan for the type and frequency of exercise (activity plan) that works for you. Make sure that you:  Do at least 150 minutes of moderate-intensity or vigorous-intensity exercise each week. This could be brisk walking, biking, or water aerobics.  Do stretching and strength exercises, such as yoga or weightlifting, at least 2 times a week.  Spread out your activity over  at least 3 days of the week.  Get some form of physical activity every day.  Do not go more than 2 days in a row without some kind of physical activity.  Avoid being inactive for more than 90 minutes at a time. Take frequent breaks to walk or stretch.  Choose a type of exercise or activity that you enjoy, and set realistic goals.  Start slowly, and gradually increase the intensity of your exercise over time. What do I need to know about managing my diabetes?  Check your blood glucose before and after exercising.  If your blood glucose is higher than 240 mg/dL (13.3 mmol/L) before you exercise, check your urine for ketones. If you have ketones in your urine, do not exercise until your blood glucose returns to normal.  Know the symptoms of low blood glucose (hypoglycemia) and how to treat it. Your risk for hypoglycemia increases during and after exercise. Common symptoms of hypoglycemia can include:  Hunger.  Anxiety.  Sweating and feeling clammy.  Confusion.  Dizziness or feeling  light-headed.  Increased heart rate or palpitations.  Blurry vision.  Tingling or numbness around the mouth, lips, or tongue.  Tremors or shakes.  Irritability.  Keep a rapid-acting carbohydrate snack available before, during, and after exercise to help prevent or treat hypoglycemia.  Avoid injecting insulin into areas of the body that are going to be exercised. For example, avoid injecting insulin into:  The arms, when playing tennis.  The legs, when jogging.  Keep records of your exercise habits. Doing this can help you and your health care provider adjust your diabetes management plan as needed. Write down:  Food that you eat before and after you exercise.  Blood glucose levels before and after you exercise.  The type and amount of exercise you have done.  When your insulin is expected to peak, if you use insulin. Avoid exercising at times when your insulin is peaking.  When you start a new exercise or activity, work with your health care provider to make sure the activity is safe for you, and to adjust your insulin, medicines, or food intake as needed.  Drink plenty of water while you exercise to prevent dehydration or heat stroke. Drink enough fluid to keep your urine clear or pale yellow. This information is not intended to replace advice given to you by your health care provider. Make sure you discuss any questions you have with your health care provider. Document Released: 05/01/2003 Document Revised: 08/29/2015 Document Reviewed: 07/21/2015 Elsevier Interactive Patient Education  2017 [place].   Blood Glucose Monitoring, Adult Monitoring your blood sugar (glucose) helps you manage your diabetes. It also helps you and your health care provider determine how well your diabetes management plan is working. Blood glucose monitoring involves checking your blood glucose as often as directed, and keeping a record (log) of your results over time. Why should I monitor my  blood glucose? Checking your blood glucose regularly can:  Help you understand how food, exercise, illnesses, and medicines affect your blood glucose.  Let you know what your blood glucose is at any time. You can quickly tell if you are having low blood glucose (hypoglycemia) or high blood glucose (hyperglycemia).  Help you and your health care provider adjust your medicines as needed. When should I check my blood glucose? Follow instructions from your health care provider about how often to check your blood glucose. This may depend on:  The type of diabetes you have.  How  well-controlled your diabetes is.  Medicines you are taking. If you have type 1 diabetes:   Check your blood glucose at least 2 times a day.  Also check your blood glucose:  Before every insulin injection.  Before and after exercise.  Between meals.  2 hours after a meal.  Occasionally between 2:00 a.m. and 3:00 a.m., as directed.  Before potentially dangerous tasks, like driving or using heavy machinery.  At bedtime.  You may need to check your blood glucose more often, up to 6-10 times a day:  If you use an insulin pump.  If you need multiple daily injections (MDI).  If your diabetes is not well-controlled.  If you are ill.  If you have a history of severe hypoglycemia.  If you have a history of not knowing when your blood glucose is getting low (hypoglycemia unawareness). If you have type 2 diabetes:   If you take insulin or other diabetes medicines, check your blood glucose at least 2 times a day.  If you are on intensive insulin therapy, check your blood glucose at least 4 times a day. Occasionally, you may also need to check between 2:00 a.m. and 3:00 a.m., as directed.  Also check your blood glucose:  Before and after exercise.  Before potentially dangerous tasks, like driving or using heavy machinery.  You may need to check your blood glucose more often if:  Your medicine is  being adjusted.  Your diabetes is not well-controlled.  You are ill. What is a blood glucose log?  A blood glucose log is a record of your blood glucose readings. It helps you and your health care provider:  Look for patterns in your blood glucose over time.  Adjust your diabetes management plan as needed.  Every time you check your blood glucose, write down your result and notes about things that may be affecting your blood glucose, such as your diet and exercise for the day.  Most glucose meters store a record of glucose readings in the meter. Some meters allow you to download your records to a computer. How do I check my blood glucose? Follow these steps to get accurate readings of your blood glucose: Supplies needed    Blood glucose meter.  Test strips for your meter. Each meter has its own strips. You must use the strips that come with your meter.  A needle to prick your finger (lancet). Do not use lancets more than once.  A device that holds the lancet (lancing device).  A journal or log book to write down your results. Procedure   Wash your hands with soap and water.  Prick the side of your finger (not the tip) with the lancet. Use a different finger each time.  Gently rub the finger until a small drop of blood appears.  Follow instructions that come with your meter for inserting the test strip, applying blood to the strip, and using your blood glucose meter.  Write down your result and any notes. Alternative testing sites   Some meters allow you to use areas of your body other than your finger (alternative sites) to test your blood.  If you think you may have hypoglycemia, or if you have hypoglycemia unawareness, do not use alternative sites. Use your finger instead.  Alternative sites may not be as accurate as the fingers, because blood flow is slower in these areas. This means that the result you get may be delayed, and it may be different from the result that  you would get from your finger.  The most common alternative sites are:  Forearm.  Thigh.  Palm of the hand. Additional tips   Always keep your supplies with you.  If you have questions or need help, all blood glucose meters have a 24-hour hotline number that you can call. You may also contact your health care provider.  After you use a few boxes of test strips, adjust (calibrate) your blood glucose meter by following instructions that came with your meter. This information is not intended to replace advice given to you by your health care provider. Make sure you discuss any questions you have with your health care provider. Document Released: 02/11/2003 Document Revised: 08/29/2015 Document Reviewed: 07/21/2015 Elsevier Interactive Patient Education  2017 [employer].

## 2016-07-09 NOTE — Discharge Summary (Signed)
DISCHARGE SUMMARY  Name: [name]. MRN: [id number] DOB: 10/11/1970    ADMISSION DATE:  07/08/2016  REFERRING MD :  Dr. Jannifer Franklin  CHIEF COMPLAINT:  Nausea  BRIEF PATIENT DESCRIPTION: 46 year old male  with worsening Insulin Resistance ,admitted to achieve blood sugars under control on Insulin gtt.  SIGNIFICANT EVENTS  5/18 Patient admitted to the ICU on insulin gtt-transitioned off insulin drip  Patient feels well, no acute issues prior to discharge, patient states that he feels well enough to go home Insulin regimen discussed with patient.   Last FSBS 238 Patient to follow up at [facility] :   has a past medical history of Cellulitis; Diabetes mellitus without complication ([place]); Heart palpitations; HLD (hyperlipidemia); and Hypertension.  has a past surgical history that includes Arthroscopic repair ACL; Tonsilectomy/adenoidectomy with myringotomy; Prostate surgery (2016); Tonsillectomy; Colonoscopy with propofol (N/A, 10/28/2015); Colonoscopy with propofol (N/A, 10/29/2015); and Amputation (Left, 03/16/2016).    DISCHARGE MEDS Prior to Admission medications   Medication Sig Start Date End Date Taking? Authorizing Provider  amLODipine (NORVASC) 5 MG tablet Take 5 mg by mouth daily. 05/26/16  Yes [provider]  metFORMIN (GLUCOPHAGE-XR) 500 MG 24 hr tablet Take 500 mg by mouth 2 (two) times daily. 04/20/16  Yes [provider]  carvedilol (COREG) 6.25 MG tablet Take 1 tablet (6.25 mg total) by mouth 2 (two) times daily with a meal. Patient not taking: Reported on 07/08/2016 06/28/15   Dana Allan I, MD  HYDROcodone-acetaminophen (NORCO/VICODIN) 5-325 MG tablet Take 1 tablet by mouth 2 (two) times daily as needed. Patient not taking: Reported on 07/08/2016 03/17/16   Damita Lack, MD  insulin aspart (NOVOLOG) 100 UNIT/ML injection Inject 4 Units into the skin 3 (three) times daily before meals. Patient not  taking: Reported on 07/08/2016 03/10/15   Fritzi Mandes, MD  insulin glargine (LANTUS) 100 UNIT/ML injection Inject 0.2 mLs (20 Units total) into the skin 2 (two) times daily. Patient not taking: Reported on 07/08/2016 06/28/15   Dana Allan I, MD  ondansetron (ZOFRAN ODT) 4 MG disintegrating tablet Take 1 tablet (4 mg total) by mouth every 8 (eight) hours as needed for nausea or vomiting. Patient not taking: Reported on 07/08/2016 03/17/16   Damita Lack, MD  TRESIBA FLEXTOUCH 200 UNIT/ML SOPN Inject 0-150 Units into the skin daily. Inject up to 150 Units into the skin daily. 07/02/16   [provider]   Allergies  Allergen Reactions  . Levaquin [Levofloxacin] Swelling  . Phenergan [Promethazine Hcl] Diarrhea    VITAL SIGNS: Temp:  [98 F (36.7 C)-98.4 F (36.9 C)] 98.4 F (36.9 C) (05/18 1200) Pulse Rate:  [77-97] 81 (05/18 1200) Resp:  [11-30] 12 (05/18 1200) BP: (123-168)/(74-131) 163/106 (05/18 1100) SpO2:  [95 %-100 %] 100 % (05/18 1200) Weight:  [228 lb (103.4 kg)] 228 lb (103.4 kg) (05/17 1815)  PHYSICAL EXAMINATION: General:  Middle aged male, in no acute distress  Neuro:  Awake, Alert and oriented  HEENT:  AT,[place],No jvd, PERRLA Cardiovascular: S1S2, Regular, no m/r/g Lungs:  Clear bilaterally, no wheezes, crackles, rhonchi noted Abdomen:  Soft, NT,ND Musculoskeletal:  No edema,cyanosis noted Skin: warm, dry and intact.   Recent Labs Lab 07/08/16 1824 07/09/16 0356  NA 130* 139  K 3.9 3.3*  CL 95* 103  CO2 28 30  BUN 35* 28*  CREATININE 1.76* 1.36*  GLUCOSE 574* 169*    Recent Labs Lab 07/08/16 1824 07/09/16  0356  HGB 11.8* 11.4*  HCT 37.4* 34.7*  WBC 7.9 7.4  PLT 267 252   No results found.  Ok to go home. Patient and ICU staff agree with plan of care. VS reviewed and stable   Patient satisfied with Plan of action and management. All questions answered  Corrin Parker, M.D.  [facility]  Medical  Director [facility] Director [facility] Cardio-Pulmonary Department

## 2016-07-09 NOTE — Progress Notes (Signed)
Inpatient Diabetes Program Recommendations  AACE/ADA: New Consensus Statement on Inpatient Glycemic Control (2015)  Target Ranges:  Prepandial:   less than 140 mg/dL      Peak postprandial:   less than 180 mg/dL (1-2 hours)      Critically ill patients:  140 - 180 mg/dL   Lab Results  Component Value Date   GLUCAP 177 (H) 07/09/2016   HGBA1C 15.6 (H) 03/06/2015    Review of Glycemic Control  Results for [name], [name] (MRN [id number]) as of 07/09/2016 09:50  Ref. Range 07/09/2016 03:58 07/09/2016 05:08 07/09/2016 06:11 07/09/2016 07:09 07/09/2016 07:52  Glucose-Capillary Latest Ref Range: 65 - 99 mg/dL 162 (H) 175 (H) 173 (H) 163 (H) 177 (H)    Diabetes history: Type 2   Ordered medications: Note dated 07/02/16 from Pharm D. Evalyn Casco, [facility] Outpatient Diabetes medications: Tresiba 150 units daily.  Continue Novolog doses before meals to: Blood sugar 71-150 --> 40 units Blood sugar 151-200 --> 44 units Blood sugar 201-250 -->48 units  Blood sugar 251-300 -->52 units  Blood sugar 301-350 -->56 units  Blood sugar 351-400 --> 60 units Blood sugar >400 -->64 units  **Continue metformin 500 mg twice a day.  **Check blood sugar four times each day:  before breakfast  before lunch  before supper  at bedtime  **For blood sugar less than 70 --> Treat with 4 ounces of juice or regular soda, or with 3 to 4 glucose tablets. Re-check blood sugar in 15 minutes.   If blood sugar is still less than 70 on re-check, treat again and re-check in 15 minutes.   If blood sugar is over 70 on re-check and it is time to eat your regular meal, eat regular meal and take insulin as prescribed for that blood sugar.  **For blood sugar over 400 -->   During business hours, call the clinic at [phone].  Outside of business hours, call [phone] and ask to page the Adult Endocrine Fellow On-Call.    Patient taking; Tresiba 150 units qam (this is a U300 insulin),  Novolog 50 units tid with meals, Metformin 500mg  bid  Current orders for Inpatient glycemic control: Novolog 2-6 units tid, Lantus 40 units qam  Inpatient Diabetes Program Recommendations:     Consider giving the patient another 10 units of Lantus this morning, then increase Lantus dose to Lantus 50 units qam starting tomorrow., Add Novolog 15 units tid and add Novolog 0-20 units tid/hs. (D/C Novolog 2-6 units tid)   Spoke to patient regarding recent increase in insulin dosing- he sees endocrinology on a regular basis. Confirms he gives insulin as ordered but in talking to him, he is only taking Novolog 50 units tid (he should follow the custom scale as ordered)  I have asked him to contact his endocrinologist today to notify them of the admission.  At discharge, patient should revert back to orders from [facility] endocrinology.   Gentry Fitz, RN, BA, MHA, CDE Diabetes Coordinator Inpatient Diabetes Program  [phone] (Team Pager) [phone] ([place]) 07/09/2016 10:21 AM

## 2016-07-09 NOTE — Progress Notes (Signed)
eLink Physician-Brief Progress Note Patient Name: [name]. DOB: 10-Feb-1971 MRN: [id number]   Date of Service  07/09/2016  HPI/Events of Note  49 Male with DM/CRI/HTN/HLD presenting with nausea and hyperglycemia with CBG of greater than 500.  No DKA.  Recent changes in diabetic oral medications.  Now on insulin gtt.  On camera check patient is HD stable with sats of 97% on RA.  eICU Interventions  Plan of care per primary admitting team Continue to monitor via [facility]     Intervention Category Evaluation Type: New Patient Evaluation  DETERDING,ELIZABETH 07/09/2016, 1:46 AM

## 2016-07-09 NOTE — Progress Notes (Signed)
Patient ID: [name]., male   DOB: 1970-11-04, 46 y.o.   MRN: [id number]  [employer] PROGRESS NOTE  [name]. WUJ:[id number] DOB: 08/18/1970 DOA: 07/08/2016 PCP: [facility]  HPI/Subjective: Patient feels better when her sugars are under 2-300 range. Came in with sugars in the 500 range. Has headache and slight nausea.  Objective: Vitals:   07/09/16 0200 07/09/16 0800  BP:    Pulse: 80   Resp: 14   Temp: 98.1 F (36.7 C) (P) 98.1 F (36.7 C)    Filed Weights   07/08/16 1815  Weight: 103.4 kg (228 lb)    ROS: Review of Systems  Constitutional: Negative for chills and fever.  Eyes: Negative for blurred vision.  Respiratory: Negative for cough and shortness of breath.   Cardiovascular: Negative for chest pain.  Gastrointestinal: Positive for nausea. Negative for abdominal pain, constipation, diarrhea and vomiting.  Genitourinary: Negative for dysuria.  Musculoskeletal: Negative for joint pain.  Neurological: Positive for headaches. Negative for dizziness.   Exam: Physical Exam  Constitutional: He is oriented to person, place, and time.  HENT:  Nose: No mucosal edema.  Mouth/Throat: No oropharyngeal exudate or posterior oropharyngeal edema.  Eyes: Conjunctivae, EOM and lids are normal. Pupils are equal, round, and reactive to light.  Neck: No JVD present. Carotid bruit is not present. No edema present. No thyroid mass and no thyromegaly present.  Cardiovascular: S1 normal and S2 normal.  Exam reveals no gallop.   No murmur heard. Pulses:      Dorsalis pedis pulses are 2+ on the right side, and 2+ on the left side.  Respiratory: No respiratory distress. He has no wheezes. He has no rhonchi. He has no rales.  GI: Soft. Bowel sounds are normal. There is no tenderness.  Musculoskeletal:       Right ankle: He exhibits no swelling.       Left ankle: He exhibits no swelling.  Lymphadenopathy:    He has no cervical  adenopathy.  Neurological: He is alert and oriented to person, place, and time. No cranial nerve deficit.  Skin: Skin is warm. No rash noted. Nails show no clubbing.  Psychiatric: He has a normal mood and affect.      Data Reviewed: Basic Metabolic Panel:  Recent Labs Lab 07/08/16 1824 07/09/16 0356  NA 130* 139  K 3.9 3.3*  CL 95* 103  CO2 28 30  GLUCOSE 574* 169*  BUN 35* 28*  CREATININE 1.76* 1.36*  CALCIUM 8.8* 9.0   CBC:  Recent Labs Lab 07/08/16 1824 07/09/16 0356  WBC 7.9 7.4  HGB 11.8* 11.4*  HCT 37.4* 34.7*  MCV 70.6* 68.9*  PLT 267 252    CBG:  Recent Labs Lab 07/09/16 0358 07/09/16 0508 07/09/16 0611 07/09/16 0709 07/09/16 0752  GLUCAP 162* 175* 173* 163* 177*    Recent Results (from the past 240 hour(s))  MRSA PCR Screening     Status: None   Collection Time: 07/09/16  1:51 AM  Result Value Ref Range Status   MRSA by PCR NEGATIVE NEGATIVE Final    Comment:        The GeneXpert MRSA Assay (FDA approved for NASAL specimens only), is one component of a comprehensive MRSA colonization surveillance program. It is not intended to diagnose MRSA infection nor to guide or monitor treatment for MRSA infections.       Scheduled Meds: . amLODipine  5 mg Oral Daily  . heparin  5,000  Units Subcutaneous Q8H  . insulin aspart  2-6 Units Subcutaneous TID WC  . insulin glargine  40 Units Subcutaneous Q24H   Continuous Infusions: . dextrose    . insulin (NOVOLIN-R) infusion Stopped (07/09/16 0756)    Assessment/Plan:  1. Uncontrolled diabetes mellitus. Last hemoglobin A1c in January was 13.6. Hemoglobin A1c was not sent on this admission. Sugars have improved. I would continue glargine insulin 40 units subcutaneous injection every 24 hours and NovoLog sliding scale prior to meals upon discharge home. Follow-up with Dr. Evalyn Casco endocrinologist at [facility] within a week. Patient considering insulin pump. 2. Lactic acidosis. Would continue to  hold Glucophage at this time 3. Essential hypertension on amlodipine 4. Hypokalemia, replace potassium  Code Status:     Code Status Orders        Start     Ordered   07/09/16 0141  Full code  Continuous     07/09/16 0141    Code Status History    Date Active Date Inactive Code Status Order ID Comments User Context   03/15/2016 10:07 PM 03/17/2016  5:53 PM Full Code [id number]  Etta Quill, DO ED   06/23/2015  9:39 PM 06/28/2015  5:33 PM Full Code [id number]  Ivor Costa, MD ED   03/07/2015  1:33 AM 03/10/2015  9:07 PM Full Code [id number]  Harrie Foreman, MD Inpatient     Family Communication: As per critical care specialist Disposition Plan: Stable for discharge home. Case discussed with Dr. Mortimer Fries critical care specialist. He will handle the discharge since the patient is physically in the ICU.  Consultants:  Critical care specialist  Time spent: 25 minutes  [place], [place]

## 2016-07-10 LAB — HIV ANTIBODY (ROUTINE TESTING W REFLEX): HIV Screen 4th Generation wRfx: NONREACTIVE

## 2016-07-15 ENCOUNTER — Ambulatory Visit (INDEPENDENT_AMBULATORY_CARE_PROVIDER_SITE_OTHER): Payer: BC Managed Care – PPO | Admitting: Podiatry

## 2016-07-15 DIAGNOSIS — T148XXA Other injury of unspecified body region, initial encounter: Secondary | ICD-10-CM | POA: Diagnosis not present

## 2016-07-15 DIAGNOSIS — Z89412 Acquired absence of left great toe: Secondary | ICD-10-CM

## 2016-07-15 MED ORDER — NONFORMULARY OR COMPOUNDED ITEM: 0 refills | Status: DC

## 2016-07-20 NOTE — Progress Notes (Signed)
Subjective: 46 year old male presents the office today for evaluation of left ankle pain, peroneal tendinitis. He states the pain flexor worsen since last appointment. He has noticed swelling to assess aspect of his ankle. Denies any recent injury or trauma. He does state that when he wears the cam boot to his pain is much improved but when he comes out of the boot he starts to get more pain. He states he is doing well to the surgical site. He gets some occasional phantom pains. Denies any systemic complaints such as fevers, chills, nausea, vomiting. No acute changes since last appointment, and no other complaints at this time.   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 There is no pain or swelling to the surgical site. There is tenderness which continues along the left lateral foot just proximal to the fifth metatarsal base as well as to the posterior aspect of the lateral ankle just posterior to lateral malleolus on the course of the peroneal tendons. The peroneal tendons appear to be intact but there is tenderness along the tendon. There is no specific area of tenderness identified at this time. There is mild increase in swelling the lateral aspect of ankle. There is no swelling to the foot. No erythema to the foot. No open lesions or pre-ulcerative lesions.  No pain with calf compression, swelling, warmth, erythema  Assessment: Left foot peroneal tendinitis, status post amputation which is well-healed.  Plan: -All treatment options discussed with the patient including all alternatives, risks, complications.  -At this time, I recommend an MRI of the left ankle to rule out peroneal tendon tear. Continue with CAM boot for now. -Diabetic inserts, filler has arise however does not appear be fitting well. I did send this to [facility] to evaluate and modify. -Follow-up as scheduled or sooner if  any. -Patient encouraged to call the office with any questions, concerns, change in symptoms.   Celesta Gentile, DPM

## 2016-07-22 ENCOUNTER — Ambulatory Visit: Payer: BC Managed Care – PPO | Admitting: Podiatry

## 2016-08-04 ENCOUNTER — Ambulatory Visit
Admission: RE | Admit: 2016-08-04 | Discharge: 2016-08-04 | Disposition: A | Discharge status: Home / Self Care | Payer: BC Managed Care – PPO | Source: Ambulatory Visit | Attending: Podiatry | Admitting: Podiatry

## 2016-08-05 ENCOUNTER — Encounter: Payer: Self-pay | Admitting: Podiatry

## 2016-08-05 ENCOUNTER — Ambulatory Visit (INDEPENDENT_AMBULATORY_CARE_PROVIDER_SITE_OTHER): Payer: BC Managed Care – PPO | Admitting: Podiatry

## 2016-08-05 DIAGNOSIS — T148XXA Other injury of unspecified body region, initial encounter: Secondary | ICD-10-CM

## 2016-08-05 DIAGNOSIS — Z89412 Acquired absence of left great toe: Secondary | ICD-10-CM | POA: Diagnosis not present

## 2016-08-09 ENCOUNTER — Ambulatory Visit (INDEPENDENT_AMBULATORY_CARE_PROVIDER_SITE_OTHER): Payer: Self-pay | Admitting: Podiatry

## 2016-08-09 DIAGNOSIS — T148XXA Other injury of unspecified body region, initial encounter: Secondary | ICD-10-CM

## 2016-08-09 DIAGNOSIS — Z89412 Acquired absence of left great toe: Secondary | ICD-10-CM

## 2016-08-09 NOTE — Progress Notes (Signed)
Saw patient today for diabetic toefiller modification.  Trimmed toe filler to fit well against remaining first ray.   Advised patient to get shoes that will accommodate the filler.  His Rebook shoes does not.

## 2016-08-13 NOTE — Telephone Encounter (Signed)
OK to do PT- gait training/stability

## 2016-08-17 ENCOUNTER — Other Ambulatory Visit: Payer: Self-pay | Admitting: Internal Medicine

## 2016-08-17 MED ORDER — LISINOPRIL 10 MG PO TABS: 10.0000 mg | ORAL_TABLET | Freq: Every day | ORAL | 0 refills | Status: DC

## 2016-08-18 ENCOUNTER — Encounter: Payer: Self-pay | Admitting: Medical Oncology

## 2016-08-18 ENCOUNTER — Inpatient Hospital Stay
Admission: EM | Admit: 2016-08-18 | Discharge: 2016-08-19 | DRG: 638 | Disposition: A | Discharge status: Home / Self Care | Payer: BC Managed Care – PPO | Attending: Internal Medicine | Admitting: Internal Medicine

## 2016-08-18 DIAGNOSIS — R42 Dizziness and giddiness: Secondary | ICD-10-CM | POA: Diagnosis not present

## 2016-08-18 DIAGNOSIS — E860 Dehydration: Secondary | ICD-10-CM | POA: Diagnosis present

## 2016-08-18 DIAGNOSIS — G4733 Obstructive sleep apnea (adult) (pediatric): Secondary | ICD-10-CM | POA: Diagnosis present

## 2016-08-18 DIAGNOSIS — E1122 Type 2 diabetes mellitus with diabetic chronic kidney disease: Secondary | ICD-10-CM | POA: Diagnosis present

## 2016-08-18 DIAGNOSIS — E785 Hyperlipidemia, unspecified: Secondary | ICD-10-CM | POA: Diagnosis present

## 2016-08-18 DIAGNOSIS — E1165 Type 2 diabetes mellitus with hyperglycemia: Secondary | ICD-10-CM | POA: Diagnosis not present

## 2016-08-18 DIAGNOSIS — Z794 Long term (current) use of insulin: Secondary | ICD-10-CM | POA: Diagnosis not present

## 2016-08-18 DIAGNOSIS — Z833 Family history of diabetes mellitus: Secondary | ICD-10-CM | POA: Diagnosis not present

## 2016-08-18 DIAGNOSIS — Z8249 Family history of ischemic heart disease and other diseases of the circulatory system: Secondary | ICD-10-CM

## 2016-08-18 DIAGNOSIS — Z841 Family history of disorders of kidney and ureter: Secondary | ICD-10-CM | POA: Diagnosis not present

## 2016-08-18 DIAGNOSIS — I129 Hypertensive chronic kidney disease with stage 1 through stage 4 chronic kidney disease, or unspecified chronic kidney disease: Secondary | ICD-10-CM | POA: Diagnosis present

## 2016-08-18 DIAGNOSIS — D631 Anemia in chronic kidney disease: Secondary | ICD-10-CM | POA: Diagnosis present

## 2016-08-18 DIAGNOSIS — R197 Diarrhea, unspecified: Secondary | ICD-10-CM | POA: Diagnosis present

## 2016-08-18 DIAGNOSIS — N183 Chronic kidney disease, stage 3 (moderate): Secondary | ICD-10-CM | POA: Diagnosis present

## 2016-08-18 DIAGNOSIS — E11319 Type 2 diabetes mellitus with unspecified diabetic retinopathy without macular edema: Secondary | ICD-10-CM | POA: Diagnosis present

## 2016-08-18 DIAGNOSIS — N179 Acute kidney failure, unspecified: Secondary | ICD-10-CM | POA: Diagnosis present

## 2016-08-18 DIAGNOSIS — N170 Acute kidney failure with tubular necrosis: Secondary | ICD-10-CM | POA: Diagnosis present

## 2016-08-18 LAB — URINALYSIS, COMPLETE (UACMP) WITH MICROSCOPIC
BACTERIA UA: NONE SEEN
Bilirubin Urine: NEGATIVE
Glucose, UA: >=500 mg/dL — AB
Hgb urine dipstick: NEGATIVE
Ketones, ur: NEGATIVE mg/dL
Leukocytes, UA: NEGATIVE
Nitrite: NEGATIVE
PH: 5 (ref 5.0–8.0)
Protein, ur: 30 mg/dL — AB
SPECIFIC GRAVITY, URINE: 1.022 (ref 1.005–1.030)
SQUAMOUS EPITHELIAL / LPF: NONE SEEN

## 2016-08-18 LAB — CBC
HEMATOCRIT: 36.8 % — AB (ref 40.0–52.0)
Hemoglobin: 11.6 g/dL — ABNORMAL LOW (ref 13.0–18.0)
MCH: 22.2 pg — ABNORMAL LOW (ref 26.0–34.0)
MCHC: 31.5 g/dL — AB (ref 32.0–36.0)
MCV: 70.4 fL — AB (ref 80.0–100.0)
Platelets: 255 10*3/uL (ref 150–440)
RBC: 5.23 MIL/uL (ref 4.40–5.90)
RDW: 14.5 % (ref 11.5–14.5)
WBC: 9.6 10*3/uL (ref 3.8–10.6)

## 2016-08-18 LAB — BASIC METABOLIC PANEL
Anion gap: 8 (ref 5–15)
BUN: 41 mg/dL — AB (ref 6–20)
CHLORIDE: 92 mmol/L — AB (ref 101–111)
CO2: 25 mmol/L (ref 22–32)
Calcium: 8.6 mg/dL — ABNORMAL LOW (ref 8.9–10.3)
Creatinine, Ser: 2.31 mg/dL — ABNORMAL HIGH (ref 0.61–1.24)
GFR calc Af Amer: 37 mL/min — ABNORMAL LOW (ref >60)
GFR, EST NON AFRICAN AMERICAN: 32 mL/min — AB (ref >60)
GLUCOSE: 624 mg/dL — AB (ref 65–99)
POTASSIUM: 4.5 mmol/L (ref 3.5–5.1)
Sodium: 125 mmol/L — ABNORMAL LOW (ref 135–145)

## 2016-08-18 LAB — HEPATIC FUNCTION PANEL
ALBUMIN: 3 g/dL — AB (ref 3.5–5.0)
ALK PHOS: 74 U/L (ref 38–126)
ALT: 20 U/L (ref 17–63)
AST: 24 U/L (ref 15–41)
BILIRUBIN TOTAL: 0.7 mg/dL (ref 0.3–1.2)
Bilirubin, Direct: <0.1 mg/dL — ABNORMAL LOW (ref 0.1–0.5)
Total Protein: 6.6 g/dL (ref 6.5–8.1)

## 2016-08-18 LAB — GLUCOSE, CAPILLARY
GLUCOSE-CAPILLARY: 296 mg/dL — AB (ref 65–99)
Glucose-Capillary: 323 mg/dL — ABNORMAL HIGH (ref 65–99)
Glucose-Capillary: 324 mg/dL — ABNORMAL HIGH (ref 65–99)
Glucose-Capillary: 249 mg/dL — ABNORMAL HIGH (ref 65–99)
Glucose-Capillary: 232 mg/dL — ABNORMAL HIGH (ref 65–99)

## 2016-08-18 LAB — LIPASE, BLOOD: Lipase: 24 U/L (ref 11–51)

## 2016-08-18 MED ORDER — INSULIN ASPART 100 UNIT/ML ~~LOC~~ SOLN
0.0000 [IU] | Freq: Three times a day (TID) | SUBCUTANEOUS | Status: DC
  Administered 2016-08-18: 5 [IU] via SUBCUTANEOUS
  Administered 2016-08-19: 8 [IU] via SUBCUTANEOUS
  Administered 2016-08-19: 5 [IU] via SUBCUTANEOUS
  Filled 2016-08-18 (×3): qty 1

## 2016-08-18 MED ORDER — HEPARIN SODIUM (PORCINE) 5000 UNIT/ML IJ SOLN
5000.0000 [IU] | Freq: Three times a day (TID) | INTRAMUSCULAR | Status: DC
  Administered 2016-08-19: 5000 [IU] via SUBCUTANEOUS
  Filled 2016-08-18: qty 1

## 2016-08-18 MED ORDER — PANTOPRAZOLE SODIUM 40 MG IV SOLR
40.0000 mg | INTRAVENOUS | Status: DC
  Administered 2016-08-18: 40 mg via INTRAVENOUS
  Filled 2016-08-18: qty 40

## 2016-08-18 MED ORDER — SODIUM CHLORIDE 0.9 % IV SOLN
INTRAVENOUS | Status: DC
  Administered 2016-08-18 – 2016-08-19 (×2): via INTRAVENOUS

## 2016-08-18 MED ORDER — SODIUM CHLORIDE 0.9 % IV BOLUS (SEPSIS)
1000.0000 mL | Freq: Once | INTRAVENOUS | Status: AC
  Administered 2016-08-18: 1000 mL via INTRAVENOUS

## 2016-08-18 MED ORDER — SODIUM CHLORIDE 0.9 % IV SOLN
INTRAVENOUS | Status: DC
  Administered 2016-08-18: 2.6 [IU]/h via INTRAVENOUS
  Filled 2016-08-18: qty 1

## 2016-08-18 MED ORDER — AMLODIPINE BESYLATE 5 MG PO TABS
5.0000 mg | ORAL_TABLET | Freq: Every day | ORAL | Status: DC
  Administered 2016-08-19: 5 mg via ORAL
  Filled 2016-08-18: qty 1

## 2016-08-18 MED ORDER — DOCUSATE SODIUM 100 MG PO CAPS: 100.0000 mg | ORAL_CAPSULE | Freq: Two times a day (BID) | ORAL | Status: DC | PRN

## 2016-08-18 MED ORDER — SODIUM CHLORIDE 0.9 % IV SOLN: INTRAVENOUS | Status: DC

## 2016-08-18 NOTE — ED Notes (Addendum)
Physician notified of pt's blood sugar and scheduled orders, see new orders for follow up. Per Physician, pt not in DKA and to discontinue insulin infusion and follow up with recheck blood sugar and give novolog. See chart for orders.

## 2016-08-18 NOTE — H&P (Addendum)
[employer] [facility] [place] NAME: [name]    MR#:  [id number]  DATE OF BIRTH:  November 27, 1970  DATE OF ADMISSION:  08/18/2016  PRIMARY CARE PHYSICIAN: [facility]   REQUESTING/REFERRING PHYSICIAN: Marcos Eke  CHIEF COMPLAINT:   Chief Complaint  Patient presents with  . Dizziness    HISTORY OF PRESENT ILLNESS:  [name]  is a 46 y.o. male with a known history of Cellulitis, diabetes mellitus, hyperlipidemia, hypertension- takes very high dose of diabetic medications because of "insulin resistant". Last month he had an admission for hyperglycemia For last 4-5 days he started having diarrhea every night multiple episodes lose and watery stools with chills. Denies any associated abdominal pain or vomiting. He continued taking his medications as prescribed. He noted his sugar was very high and was going urination multiple times for last 1-2 days so came to emergency room and his blood sugar noted more than 600 and he was noted having acute on chronic renal failure so given to hospitalist team for further management after starting on insulin IV drip.  PAST MEDICAL HISTORY:   Past Medical History:  Diagnosis Date  . Cellulitis   . Diabetes mellitus without complication ([place])   . Heart palpitations   . HLD (hyperlipidemia)   . Hypertension     PAST SURGICAL HISTORY:   Past Surgical History:  Procedure Laterality Date  . AMPUTATION Left 03/16/2016   Procedure: AMPUTATION DIGIT LEFT HALLUX;  Surgeon: Trula Slade, DPM;  Location: [facility];  Service: Podiatry;  Laterality: Left;  can start around 5   . ARTHROSCOPIC REPAIR ACL    . COLONOSCOPY WITH PROPOFOL N/A 10/28/2015   Procedure: COLONOSCOPY WITH PROPOFOL;  Surgeon: Lollie Sails, MD;  Location: [facility] ENDOSCOPY;  Service: Endoscopy;  Laterality: N/A;  . COLONOSCOPY WITH PROPOFOL N/A 10/29/2015   Procedure: COLONOSCOPY WITH PROPOFOL;  Surgeon: Lollie Sails, MD;  Location: [facility] ENDOSCOPY;  Service: Endoscopy;  Laterality: N/A;  . PROSTATE SURGERY  2016  . TONSILECTOMY/ADENOIDECTOMY WITH MYRINGOTOMY    . TONSILLECTOMY      SOCIAL HISTORY:   Social History  Substance Use Topics  . Smoking status: Never Smoker  . Smokeless tobacco: Never Used  . Alcohol use No    FAMILY HISTORY:   Family History  Problem Relation Age of Onset  . CAD Father   . Diabetes Mellitus II Mother     DRUG ALLERGIES:   Allergies  Allergen Reactions  . Levaquin [Levofloxacin] Swelling  . Phenergan [Promethazine Hcl] Diarrhea    REVIEW OF SYSTEMS:   Review of Systems  Constitutional: Negative for chills, fever, malaise/fatigue and weight loss.  HENT: Negative for congestion, ear discharge, ear pain, hearing loss and sore throat.   Eyes: Negative for blurred vision, photophobia and redness.  Respiratory: Negative for cough, shortness of breath and wheezing.   Cardiovascular: Negative for chest pain, orthopnea and leg swelling.  Gastrointestinal: Positive for diarrhea. Negative for abdominal pain, heartburn and vomiting.  Genitourinary: Negative for dysuria, frequency and urgency.  Musculoskeletal: Negative for back pain, joint pain and myalgias.  Neurological: Negative for dizziness, tremors, speech change, focal weakness, weakness and headaches.  Psychiatric/Behavioral: Negative for depression and substance abuse.    MEDICATIONS AT HOME:   Prior to Admission medications   Medication Sig Start Date End Date Taking? Authorizing Provider  amLODipine (NORVASC) 5 MG tablet Take 5 mg by mouth daily. 05/26/16  Yes [provider]  carvedilol (COREG) 6.25 MG tablet Take 1 tablet (6.25 mg total) by mouth 2 (two) times daily with a meal. 06/28/15  Yes Bonnell Public, MD  insulin aspart (NOVOLOG) 100 UNIT/ML FlexPen Inject 50 Units into the skin 3 (three) times daily. 05/06/16 05/06/17 Yes [provider]    lisinopril-hydrochlorothiazide (PRINZIDE,ZESTORETIC) 20-25 MG tablet Take 1 tablet by mouth daily. 05/29/16  Yes [provider]  metFORMIN (GLUCOPHAGE-XR) 500 MG 24 hr tablet Take 500 mg by mouth 2 (two) times daily. 04/20/16  Yes [provider]  TRESIBA FLEXTOUCH 200 UNIT/ML SOPN Inject 0-150 Units into the skin daily. Inject up to 150 Units into the skin daily. 07/02/16  Yes [provider]  HYDROcodone-acetaminophen (NORCO/VICODIN) 5-325 MG tablet Take 1 tablet by mouth 2 (two) times daily as needed. Patient not taking: Reported on 07/08/2016 03/17/16   Damita Lack, MD  lisinopril (PRINIVIL,ZESTRIL) 10 MG tablet Take 1 tablet (10 mg total) by mouth daily. Patient not taking: Reported on 08/18/2016 08/17/16   Flora Lipps, MD  NONFORMULARY OR COMPOUNDED ITEM [facility]  Onychomycosis Nail Lacquer -  Fluconazole 2%, Terbinafine 1% DMSO Apply to affected nail once daily Qty. 120 gm 3 refills 07/15/16   Trula Slade, DPM      VITAL SIGNS:  Blood pressure 130/84, pulse 86, temperature 97.6 F (36.4 C), temperature source Oral, resp. rate 17, weight 103.4 kg (228 lb), SpO2 99 %.  PHYSICAL EXAMINATION:  Physical Exam  GENERAL:  46 y.o.-year-old patient lying in the bed with no acute distress.  EYES: Pupils equal, round, reactive to light and accommodation. No scleral icterus. Extraocular muscles intact.  HEENT: Head atraumatic, normocephalic. Oropharynx and nasopharynx clear.  NECK:  Supple, no jugular venous distention. No thyroid enlargement, no tenderness.  LUNGS: Normal breath sounds bilaterally, no wheezing, rales,rhonchi or crepitation. No use of accessory muscles of respiration.  CARDIOVASCULAR: S1, S2 normal. No murmurs, rubs, or gallops.  ABDOMEN: Soft, nontender, nondistended. Bowel sounds present. No organomegaly or mass.  EXTREMITIES: No pedal edema, cyanosis, or clubbing.  NEUROLOGIC: Cranial nerves II through XII are intact. Muscle  strength 5/5 in all extremities. Sensation intact. Gait not checked.  PSYCHIATRIC: The patient is alert and oriented x 3.  SKIN: No obvious rash, lesion, or ulcer.   LABORATORY PANEL:   CBC  Recent Labs Lab 08/18/16 1506  WBC 9.6  HGB 11.6*  HCT 36.8*  PLT 255   ------------------------------------------------------------------------------------------------------------------  Chemistries   Recent Labs Lab 08/18/16 1506  NA 125*  K 4.5  CL 92*  CO2 25  GLUCOSE 624*  BUN 41*  CREATININE 2.31*  CALCIUM 8.6*  AST 24  ALT 20  ALKPHOS 74  BILITOT 0.7   ------------------------------------------------------------------------------------------------------------------  Cardiac Enzymes No results for input(s): TROPONINI in the last 168 hours. ------------------------------------------------------------------------------------------------------------------  RADIOLOGY:  No results found.    IMPRESSION AND PLAN:   * Uncontrolled diabetes   IV insulin drip for glucose stabilization.   His taking very high dose of his diabetic medications and claims to be "insulin resistant".   We will need to consult an endocrinologist to adjust his diabetic medications.  * Acute on chronic renal failure, CK stage III.   Appreciated nephrology consult, likely secondary to dehydration.    IV fluid and continue monitoring. Some workup is ordered by nephrologist, follow.  * Hypertension   Currently we'll hold lisinopril and add a thiazide due to renal failure, continue other medications and monitor.  * Diarrhea  Check GI panel.   All the records are reviewed and case discussed with ED provider. Management plans discussed with the patient, family and they are in agreement.  CODE STATUS: full.  TOTAL TIME TAKING CARE OF THIS PATIENT: 50 critical care minutes.    Vaughan Basta M.D on 08/18/2016 at 6:50 PM  Between 7am to 6pm - Pager - [phone]  After 6pm go to  [email or web address] - [employer] [facility]  Office  [phone]  CC: Primary care physician; [facility]   Note: This dictation was prepared with Dragon dictation along with smaller phrase technology. Any transcriptional errors that result from this process are unintentional.

## 2016-08-18 NOTE — ED Notes (Signed)
[name] having diarrhea with nausea for the past 3 days, states was seen by PCP on Monday for not feeling well, states he glucose has been in the mid 300's this week.the patient has hx of DM.Marland Kitchen

## 2016-08-18 NOTE — ED Triage Notes (Signed)
Pt reports that he has been having dizziness since Monday. Pt reports dizziness is random, not necessarily with head movement. Pt denies pain other than chronic foot pain.

## 2016-08-18 NOTE — Consult Note (Signed)
[facility] CONSULT NOTE    Date: 08/18/2016                  Patient Name:  [name].  MRN: [id number]  DOB: 1970-10-20  Age / Sex: 46 y.o., male         PCP: [facility]                 Service Requesting Consult: Emergency department                 Reason for Consult: Acute renal failure with chronic kidney disease            History of Present Illness: Patient is a 46 y.o. male with a PMHx of Hypertension, diabetic retinopathy, diabetes mellitus type 2, hyperlipidemia, erectile dysfunction, obstructive sleep apnea, prior history of cellulitis, family history of end-stage renal disease who was admitted to [facility] on 08/18/2016 for evaluation of dizziness. The patient does endorse polyuria polydipsia.  Upon arrival here he was found have significantly elevated blood glucose of 624. In addition it appears that he has acute renal failure with a BUN up to 41 and creatinine of 2.31.  Patient has underlying chronic kidney disease with a creatinine of 1.36.  He also has associated proteinuria. He has not followed with a nephrologist as an outpatient.  Patient is currently being administered an IV fluid bolus. In addition insulin drip has been ordered.  Medications: Outpatient medications:  (Not in a hospital admission)  Current medications: Current Facility-Administered Medications  Medication Dose Route Frequency Provider Last Rate Last Dose  . sodium chloride 0.9 % bolus 1,000 mL  1,000 mL Intravenous Once Hinda Kehr, MD       And  . 0.9 %  sodium chloride infusion   Intravenous Continuous Hinda Kehr, MD      . insulin regular (NOVOLIN R,HUMULIN R) 100 Units in sodium chloride 0.9 % 100 mL (1 Units/mL) infusion   Intravenous Continuous Hinda Kehr, MD      . sodium chloride 0.9 % bolus 1,000 mL  1,000 mL Intravenous Once Hinda Kehr, MD 1,000 mL/hr at 08/18/16 1728 1,000 mL at 08/18/16 1728   Current Outpatient  Prescriptions  Medication Sig Dispense Refill  . amLODipine (NORVASC) 5 MG tablet Take 5 mg by mouth daily.    . carvedilol (COREG) 6.25 MG tablet Take 1 tablet (6.25 mg total) by mouth 2 (two) times daily with a meal. 60 tablet 0  . insulin aspart (NOVOLOG) 100 UNIT/ML FlexPen Inject 50 Units into the skin 3 (three) times daily.    Marland Kitchen lisinopril-hydrochlorothiazide (PRINZIDE,ZESTORETIC) 20-25 MG tablet Take 1 tablet by mouth daily.    . metFORMIN (GLUCOPHAGE-XR) 500 MG 24 hr tablet Take 500 mg by mouth 2 (two) times daily.    . TRESIBA FLEXTOUCH 200 UNIT/ML SOPN Inject 0-150 Units into the skin daily. Inject up to 150 Units into the skin daily.    Marland Kitchen HYDROcodone-acetaminophen (NORCO/VICODIN) 5-325 MG tablet Take 1 tablet by mouth 2 (two) times daily as needed. (Patient not taking: Reported on 07/08/2016) 10 tablet 0  . lisinopril (PRINIVIL,ZESTRIL) 10 MG tablet Take 1 tablet (10 mg total) by mouth daily. (Patient not taking: Reported on 08/18/2016) 30 tablet 0  . NONFORMULARY OR COMPOUNDED ITEM [facility]  Onychomycosis Nail Lacquer -  Fluconazole 2%, Terbinafine 1% DMSO Apply to affected nail once daily Qty. 120 gm 3 refills 1 each 0  Allergies: Allergies  Allergen Reactions  . Levaquin [Levofloxacin] Swelling  . Phenergan [Promethazine Hcl] Diarrhea      Past Medical History: Past Medical History:  Diagnosis Date  . Cellulitis   . Diabetes mellitus without complication ([place])   . Heart palpitations   . HLD (hyperlipidemia)   . Hypertension      Past Surgical History: Past Surgical History:  Procedure Laterality Date  . AMPUTATION Left 03/16/2016   Procedure: AMPUTATION DIGIT LEFT HALLUX;  Surgeon: Trula Slade, DPM;  Location: [place];  Service: Podiatry;  Laterality: Left;  can start around 5   . ARTHROSCOPIC REPAIR ACL    . COLONOSCOPY WITH PROPOFOL N/A 10/28/2015   Procedure: COLONOSCOPY WITH PROPOFOL;  Surgeon: Lollie Sails, MD;  Location: [facility]  ENDOSCOPY;  Service: Endoscopy;  Laterality: N/A;  . COLONOSCOPY WITH PROPOFOL N/A 10/29/2015   Procedure: COLONOSCOPY WITH PROPOFOL;  Surgeon: Lollie Sails, MD;  Location: [facility] ENDOSCOPY;  Service: Endoscopy;  Laterality: N/A;  . PROSTATE SURGERY  2016  . TONSILECTOMY/ADENOIDECTOMY WITH MYRINGOTOMY    . TONSILLECTOMY       Family History: Family History  Problem Relation Age of Onset  . CAD Father   . Diabetes Mellitus II Mother      Social History: Social History   Social History  . Marital status: Married    Spouse name: N/A  . Number of children: N/A  . Years of education: N/A   Occupational History  . Not on file.   Social History Main Topics  . Smoking status: Never Smoker  . Smokeless tobacco: Never Used  . Alcohol use No  . Drug use: No  . Sexual activity: Not on file   Other Topics Concern  . Not on file   Social History Narrative  . No narrative on file     Review of Systems: Review of Systems  Constitutional: Positive for malaise/fatigue. Negative for chills, fever and weight loss.  HENT: Negative for hearing loss, nosebleeds and sinus pain.   Eyes: Negative for blurred vision and double vision.  Respiratory: Negative for cough, hemoptysis and sputum production.   Cardiovascular: Negative for chest pain, palpitations and orthopnea.  Gastrointestinal: Negative for heartburn, nausea and vomiting.  Genitourinary: Positive for frequency. Negative for dysuria and hematuria.  Musculoskeletal: Positive for myalgias. Negative for joint pain.  Skin: Negative for itching and rash.  Neurological: Positive for dizziness. Negative for seizures.  Endo/Heme/Allergies: Positive for polydipsia. Does not bruise/bleed easily.  Psychiatric/Behavioral: Negative for depression. The patient is not nervous/anxious.      Vital Signs: Blood pressure 130/84, pulse 86, temperature 97.6 F (36.4 C), temperature source Oral, resp. rate 17, weight 103.4 kg (228 lb), SpO2  99 %.  Weight trends: Filed Weights   08/18/16 1504  Weight: 103.4 kg (228 lb)    Physical Exam: General: NAD, sitting up in bed  Head: Normocephalic, atraumatic.  Eyes: Anicteric, EOMI  Nose: Mucous membranes moist, not inflammed, nonerythematous.  Throat: Oropharynx nonerythematous, no exudate appreciated.   Neck: Supple, trachea midline.  Lungs:  Normal respiratory effort. Clear to auscultation BL without crackles or wheezes.  Heart: RRR. S1 and S2 normal without gallop, murmur, or rubs.  Abdomen:  BS normoactive. Soft, Nondistended, non-tender.  No masses or organomegaly.  Extremities: No pretibial edema.  Neurologic: A&O X3, Motor strength is 5/5 in the all 4 extremities  Skin: No visible rashes, scars.    Lab results: Basic Metabolic Panel:  Recent Labs Lab 08/18/16 1506  NA 125*  K 4.5  CL 92*  CO2 25  GLUCOSE 624*  BUN 41*  CREATININE 2.31*  CALCIUM 8.6*    Liver Function Tests:  Recent Labs Lab 08/18/16 1506  AST 24  ALT 20  ALKPHOS 74  BILITOT 0.7  PROT 6.6  ALBUMIN 3.0*    Recent Labs Lab 08/18/16 1506  LIPASE 24   No results for input(s): AMMONIA in the last 168 hours.  CBC:  Recent Labs Lab 08/18/16 1506  WBC 9.6  HGB 11.6*  HCT 36.8*  MCV 70.4*  PLT 255    Cardiac Enzymes: No results for input(s): CKTOTAL, CKMB, CKMBINDEX, TROPONINI in the last 168 hours.  BNP: Invalid input(s): POCBNP  CBG: No results for input(s): GLUCAP in the last 168 hours.  Microbiology: Results for orders placed or performed during the hospital encounter of 07/08/16  MRSA PCR Screening     Status: None   Collection Time: 07/09/16  1:51 AM  Result Value Ref Range Status   MRSA by PCR NEGATIVE NEGATIVE Final    Comment:        The GeneXpert MRSA Assay (FDA approved for NASAL specimens only), is one component of a comprehensive MRSA colonization surveillance program. It is not intended to diagnose MRSA infection nor to guide or monitor  treatment for MRSA infections.     Coagulation Studies: No results for input(s): LABPROT, INR in the last 72 hours.  Urinalysis:  Recent Labs  08/18/16 1506  COLORURINE STRAW*  LABSPEC 1.022  PHURINE 5.0  GLUCOSEU >=500*  HGBUR NEGATIVE  BILIRUBINUR NEGATIVE  KETONESUR NEGATIVE  PROTEINUR 30*  NITRITE NEGATIVE  LEUKOCYTESUR NEGATIVE      Imaging:  No results found.   Assessment & Plan: Pt is a 46 y.o. male with a PMHx of Hypertension, diabetic retinopathy, diabetes mellitus type 2, hyperlipidemia, erectile dysfunction, obstructive sleep apnea, prior history of cellulitis, family history of end-stage renal disease (patient's mother on dialysis) who was admitted to [facility] on 08/18/2016 for evaluation of dizziness and found to have dehydration, hyperglycemia, and acute renal failure on chronic kidney disease stage III.  1. Acute renal failure/chronic kidney disease stage III Baseline creatinine 1.3/proteinuria.  The patient's creatinine is currently up to 2.31. His baseline creatinine is 1.3. Suspect that this is due to dehydration from severe hyperglycemia. Continue IV fluid hydration. Check renal ultrasound, SPEP, UPEP, and ANA. He will need outpatient monitoring for his underlying chronic kidney disease as well as proteinuria.  2.  Anemia of chronic kidney disease. Hemoglobin noted as being 11.6 with an MCV of 70.4. Check serum iron studies. Also check SPEP and UPEP.

## 2016-08-18 NOTE — ED Provider Notes (Signed)
[facility] Emergency Department Provider Note  ____________________________________________   First MD Initiated Contact with Patient 08/18/16 1728     (approximate)  I have reviewed the triage vital signs and the nursing notes.   HISTORY  Chief Complaint Dizziness    HPI [name] Christophor Eick. is a 46 y.o. male with a history of poorly controlled still independent diabetes who sees an endocrinologist at [facility] who presents by private vehicle for evaluation of gradually worsening dizziness/lightheadedness over the last 3 days.  He reports that he feels this way when his blood sugar gets up into the 300s but he thinks it is been higher than that recently.  He saw his primary care doctor 2 days ago who did some outpatient blood work and knees were not working correctly and that he should go to the emergency department but he did not want to do so.  But today his symptoms are severe so he decided to come in.  He has also been having some nausea and diarrhea for the last couple of days but no vomiting.  He denies fever/chills, chest pain, shortness of breath, abdominal pain, dysuria.  He has had greatly increased thirst and increased urinary frequency recently as well.  He reports that he saw his endocrinologist at [facility] a few Days ago and she made some medication adjustments to his regimen because he is increasingly resistant to insulin.    Nothing in particular makes the patient's symptoms better nor worse.       Past Medical History:  Diagnosis Date  . Cellulitis   . Diabetes mellitus without complication ([place])   . Heart palpitations   . HLD (hyperlipidemia)   . Hypertension     Patient Active Problem List   Diagnosis Date Noted  . Uncontrolled diabetes mellitus ([place]) 07/08/2016  . Osteomyelitis of toe of left foot ([place]) 03/15/2016  . Cellulitis, face 06/24/2015  . Facial cellulitis 06/23/2015  . AKI (acute kidney injury) ([place]) 06/23/2015  . Diarrhea  06/23/2015  . Diabetes mellitus without complication ([place])   . HLD (hyperlipidemia)   . Essential hypertension   . Sepsis ([place]) 03/07/2015    Past Surgical History:  Procedure Laterality Date  . AMPUTATION Left 03/16/2016   Procedure: AMPUTATION DIGIT LEFT HALLUX;  Surgeon: Trula Slade, DPM;  Location: [place];  Service: Podiatry;  Laterality: Left;  can start around 5   . ARTHROSCOPIC REPAIR ACL    . COLONOSCOPY WITH PROPOFOL N/A 10/28/2015   Procedure: COLONOSCOPY WITH PROPOFOL;  Surgeon: Lollie Sails, MD;  Location: [facility] ENDOSCOPY;  Service: Endoscopy;  Laterality: N/A;  . COLONOSCOPY WITH PROPOFOL N/A 10/29/2015   Procedure: COLONOSCOPY WITH PROPOFOL;  Surgeon: Lollie Sails, MD;  Location: [facility] ENDOSCOPY;  Service: Endoscopy;  Laterality: N/A;  . PROSTATE SURGERY  2016  . TONSILECTOMY/ADENOIDECTOMY WITH MYRINGOTOMY    . TONSILLECTOMY      Prior to Admission medications   Medication Sig Start Date End Date Taking? Authorizing Provider  amLODipine (NORVASC) 5 MG tablet Take 5 mg by mouth daily. 05/26/16  Yes [provider]  carvedilol (COREG) 6.25 MG tablet Take 1 tablet (6.25 mg total) by mouth 2 (two) times daily with a meal. 06/28/15  Yes Dana Allan I, MD  insulin aspart (NOVOLOG) 100 UNIT/ML FlexPen Inject 50 Units into the skin 3 (three) times daily. 05/06/16 05/06/17 Yes [provider]  lisinopril-hydrochlorothiazide (PRINZIDE,ZESTORETIC) 20-25 MG tablet Take 1 tablet by mouth daily. 05/29/16  Yes [provider]  metFORMIN (GLUCOPHAGE-XR) 500 MG 24 hr tablet Take 500 mg by mouth 2 (two) times daily. 04/20/16  Yes [provider]  TRESIBA FLEXTOUCH 200 UNIT/ML SOPN Inject 0-150 Units into the skin daily. Inject up to 150 Units into the skin daily. 07/02/16  Yes [provider]  HYDROcodone-acetaminophen (NORCO/VICODIN) 5-325 MG tablet Take 1 tablet by mouth 2 (two) times daily as needed. Patient not taking: Reported on  07/08/2016 03/17/16   Damita Lack, MD  lisinopril (PRINIVIL,ZESTRIL) 10 MG tablet Take 1 tablet (10 mg total) by mouth daily. Patient not taking: Reported on 08/18/2016 08/17/16   Flora Lipps, MD  NONFORMULARY OR COMPOUNDED ITEM [facility]  Onychomycosis Nail Lacquer -  Fluconazole 2%, Terbinafine 1% DMSO Apply to affected nail once daily Qty. 120 gm 3 refills 07/15/16   Trula Slade, DPM    Allergies Levaquin [levofloxacin] and Phenergan [promethazine hcl]  Family History  Problem Relation Age of Onset  . CAD Father   . Diabetes Mellitus II Mother     Social History Social History  Substance Use Topics  . Smoking status: Never Smoker  . Smokeless tobacco: Never Used  . Alcohol use No    Review of Systems Constitutional: No fever/chills.  Gen. malaise over the last several days Eyes: No visual changes. ENT: No sore throat. Cardiovascular: Denies chest pain. Respiratory: Denies shortness of breath. Gastrointestinal: No abdominal pain.  No vomiting but nausea and several episodes of loose stools over the last few days Genitourinary: Negative for dysuria. Musculoskeletal: Negative for neck pain.  Negative for back pain. Integumentary: Negative for rash. Neurological: Gradually increasing dizziness and lightheadedness over the last several days. Negative for headaches, focal weakness or numbness.   ____________________________________________   PHYSICAL EXAM:  VITAL SIGNS: ED Triage Vitals [08/18/16 1504]  Enc Vitals Group     BP (!) 133/103     Pulse Rate 93     Resp 18     Temp 97.6 F (36.4 C)     Temp Source Oral     SpO2 98 %     Weight 103.4 kg (228 lb)     Height      Head Circumference      Peak Flow      Pain Score      Pain Loc      Pain Edu?      Excl. in [place]?     Constitutional: Alert and oriented. Well appearing and in no acute distress. Eyes: Conjunctivae are normal. PERRL. EOMI.  No nystagmus Head: Atraumatic. Nose: No  congestion/rhinnorhea. Mouth/Throat: Mucous membranes are moist. Neck: No stridor.  No meningeal signs.   Cardiovascular: Normal rate, regular rhythm. Good peripheral circulation. Grossly normal heart sounds. Respiratory: Normal respiratory effort.  No retractions. Lungs CTAB. Gastrointestinal: Soft and nontender. No distention.  Musculoskeletal: No lower extremity tenderness nor edema. No gross deformities of extremities except for great toe amputation of the left foot which occurred years ago and appears healthy and well healed at this time. Neurologic:  Normal speech and language. No gross focal neurologic deficits are appreciated.  Skin:  Skin is warm, dry and intact. No rash noted. Psychiatric: Mood and affect are normal. Speech and behavior are normal.  ____________________________________________   LABS (all labs ordered are listed, but only abnormal results are displayed)  Labs Reviewed  BASIC METABOLIC PANEL - Abnormal; Notable for the following:       Result Value   Sodium 125 (*)    Chloride  92 (*)    Glucose, Bld 624 (*)    BUN 41 (*)    Creatinine, Ser 2.31 (*)    Calcium 8.6 (*)    GFR calc non Af Amer 32 (*)    GFR calc Af Amer 37 (*)    All other components within normal limits  CBC - Abnormal; Notable for the following:    Hemoglobin 11.6 (*)    HCT 36.8 (*)    MCV 70.4 (*)    MCH 22.2 (*)    MCHC 31.5 (*)    All other components within normal limits  URINALYSIS, COMPLETE (UACMP) WITH MICROSCOPIC - Abnormal; Notable for the following:    Color, Urine STRAW (*)    APPearance CLEAR (*)    Glucose, UA >=500 (*)    Protein, ur 30 (*)    All other components within normal limits  HEPATIC FUNCTION PANEL - Abnormal; Notable for the following:    Albumin 3.0 (*)    Bilirubin, Direct <0.1 (*)    All other components within normal limits  LIPASE, BLOOD   ____________________________________________  EKG  ED ECG REPORT I, Kassidi Elza, the attending  physician, personally viewed and interpreted this ECG.  Date: 08/18/2016 EKG Time: 15:08 Rate: 88 Rhythm: normal sinus rhythm QRS Axis: normal Intervals: normal ST/T Wave abnormalities: Non-specific ST segment / T-wave changes, but no evidence of acute ischemia. Narrative Interpretation: no evidence of acute ischemia   ____________________________________________  RADIOLOGY   No results found.  ____________________________________________   PROCEDURES  Critical Care performed: Yes, see critical care procedure note(s)   Procedure(s) performed:   .Critical Care Performed by: Hinda Kehr Authorized by: Hinda Kehr   Critical care provider statement:    Critical care time (minutes):  30   Critical care time was exclusive of:  Separately billable procedures and treating other patients   Critical care was necessary to treat or prevent imminent or life-threatening deterioration of the following conditions:  Endocrine crisis and renal failure   Critical care was time spent personally by me on the following activities:  Development of treatment plan with patient or surrogate, discussions with consultants, evaluation of patient's response to treatment, examination of patient, obtaining history from patient or surrogate, ordering and performing treatments and interventions, ordering and review of laboratory studies, ordering and review of radiographic studies, pulse oximetry, re-evaluation of patient's condition and review of old charts     ____________________________________________   INITIAL IMPRESSION / [place] / ED COURSE  Pertinent labs & imaging results that were available during my care of the patient were reviewed by me and considered in my medical decision making (see chart for details).  The patient has a normal anion gap but his blood sugars greater than 600 and he is symptomatic.  Additionally he has acute renal failure in the setting of  hyperglycemia.  I have ordered a fluid bolus and IV insulin as per the glucose stabilizer recommendations.  I spoke in person with Dr. Holley Raring with nephrology and he agrees the patient definitely needs to stay in the hospital given his comorbidities and the importance of regulating his blood sugar and improving his kidney function.  After [place] also personally evaluated the patient in the emergency department and touch base with me again in person to confirm the plan.  I spoke with Dr. Vertis Kelch with the hospitalist service who will admit.  The patient has been updated as well.      ____________________________________________  FINAL CLINICAL IMPRESSION(S) /  ED DIAGNOSES  Final diagnoses:  Acute renal failure, unspecified acute renal failure type (HCC)  Type 2 diabetes mellitus with hyperglycemia, with long-term current use of insulin (HCC)  Dizziness     MEDICATIONS GIVEN DURING THIS VISIT:  Medications  sodium chloride 0.9 % bolus 1,000 mL (1,000 mLs Intravenous New Bag/Given 08/18/16 1728)  insulin regular (NOVOLIN R,HUMULIN R) 100 Units in sodium chloride 0.9 % 100 mL (1 Units/mL) infusion (not administered)  sodium chloride 0.9 % bolus 1,000 mL (not administered)    And  0.9 %  sodium chloride infusion (not administered)     NEW OUTPATIENT MEDICATIONS STARTED DURING THIS VISIT:  New Prescriptions   No medications on file    Modified Medications   No medications on file    Discontinued Medications   No medications on file     Note:  This document was prepared using Dragon voice recognition software and may include unintentional dictation errors.    Hinda Kehr, MD 08/18/16 [id number]

## 2016-08-18 NOTE — ED Notes (Signed)
Physician paged for order verification.

## 2016-08-19 LAB — CBC
HCT: 33.1 % — ABNORMAL LOW (ref 40.0–52.0)
Hemoglobin: 10.7 g/dL — ABNORMAL LOW (ref 13.0–18.0)
MCH: 22.3 pg — ABNORMAL LOW (ref 26.0–34.0)
MCHC: 32.3 g/dL (ref 32.0–36.0)
MCV: 69.2 fL — AB (ref 80.0–100.0)
PLATELETS: 227 10*3/uL (ref 150–440)
RBC: 4.77 MIL/uL (ref 4.40–5.90)
RDW: 14.3 % (ref 11.5–14.5)
WBC: 8 10*3/uL (ref 3.8–10.6)

## 2016-08-19 LAB — PROTEIN / CREATININE RATIO, URINE
Creatinine, Urine: 83 mg/dL
Protein Creatinine Ratio: 0.34 mg/mg{Cre} — ABNORMAL HIGH (ref 0.00–0.15)
Total Protein, Urine: 28 mg/dL

## 2016-08-19 LAB — GLUCOSE, CAPILLARY
Glucose-Capillary: 222 mg/dL — ABNORMAL HIGH (ref 65–99)
Glucose-Capillary: 250 mg/dL — ABNORMAL HIGH (ref 65–99)
Glucose-Capillary: 292 mg/dL — ABNORMAL HIGH (ref 65–99)
Glucose-Capillary: 238 mg/dL — ABNORMAL HIGH (ref 65–99)

## 2016-08-19 LAB — BASIC METABOLIC PANEL
Anion gap: 4 — ABNORMAL LOW (ref 5–15)
BUN: 33 mg/dL — ABNORMAL HIGH (ref 6–20)
CHLORIDE: 107 mmol/L (ref 101–111)
CO2: 26 mmol/L (ref 22–32)
Calcium: 8.3 mg/dL — ABNORMAL LOW (ref 8.9–10.3)
Creatinine, Ser: 1.45 mg/dL — ABNORMAL HIGH (ref 0.61–1.24)
GFR calc non Af Amer: 56 mL/min — ABNORMAL LOW (ref >60)
Glucose, Bld: 218 mg/dL — ABNORMAL HIGH (ref 65–99)
POTASSIUM: 3.5 mmol/L (ref 3.5–5.1)
SODIUM: 137 mmol/L (ref 135–145)

## 2016-08-19 LAB — GASTROINTESTINAL PANEL BY PCR, STOOL (REPLACES STOOL CULTURE)
Adenovirus F40/41: NOT DETECTED
Astrovirus: NOT DETECTED
CAMPYLOBACTER SPECIES: NOT DETECTED
CRYPTOSPORIDIUM: NOT DETECTED
CYCLOSPORA CAYETANENSIS: NOT DETECTED
ENTEROTOXIGENIC E COLI (ETEC): NOT DETECTED
Entamoeba histolytica: NOT DETECTED
Enteroaggregative E coli (EAEC): NOT DETECTED
Enteropathogenic E coli (EPEC): NOT DETECTED
Giardia lamblia: NOT DETECTED
Norovirus GI/GII: NOT DETECTED
PLESIMONAS SHIGELLOIDES: NOT DETECTED
Rotavirus A: NOT DETECTED
SAPOVIRUS (I, II, IV, AND V): NOT DETECTED
SHIGA LIKE TOXIN PRODUCING E COLI (STEC): NOT DETECTED
Salmonella species: NOT DETECTED
Shigella/Enteroinvasive E coli (EIEC): NOT DETECTED
VIBRIO SPECIES: NOT DETECTED
Vibrio cholerae: NOT DETECTED
YERSINIA ENTEROCOLITICA: NOT DETECTED

## 2016-08-19 LAB — C DIFFICILE QUICK SCREEN W PCR REFLEX
C DIFFICILE (CDIFF) TOXIN: NEGATIVE
C DIFFICLE (CDIFF) ANTIGEN: POSITIVE — AB

## 2016-08-19 LAB — IRON AND TIBC
Iron: 51 ug/dL (ref 45–182)
Saturation Ratios: 19 % (ref 17.9–39.5)
TIBC: 267 ug/dL (ref 250–450)
UIBC: 216 ug/dL

## 2016-08-19 LAB — CLOSTRIDIUM DIFFICILE BY PCR: CDIFFPCR: NEGATIVE

## 2016-08-19 LAB — FERRITIN: FERRITIN: 73 ng/mL (ref 24–336)

## 2016-08-19 MED ORDER — INSULIN ASPART 100 UNIT/ML FLEXPEN: 60.0000 [IU] | PEN_INJECTOR | Freq: Three times a day (TID) | SUBCUTANEOUS | 11 refills | Status: DC

## 2016-08-19 MED ORDER — ACETAMINOPHEN 325 MG PO TABS: 650.0000 mg | ORAL_TABLET | Freq: Four times a day (QID) | ORAL | Status: DC | PRN

## 2016-08-19 MED ORDER — INSULIN GLARGINE 100 UNIT/ML ~~LOC~~ SOLN
6.0000 [IU] | Freq: Every day | SUBCUTANEOUS | Status: DC
  Administered 2016-08-19: 6 [IU] via SUBCUTANEOUS
  Filled 2016-08-19 (×2): qty 0.06

## 2016-08-19 MED ORDER — INSULIN ASPART 100 UNIT/ML ~~LOC~~ SOLN
22.0000 [IU] | Freq: Three times a day (TID) | SUBCUTANEOUS | Status: DC
  Administered 2016-08-19: 22 [IU] via SUBCUTANEOUS
  Filled 2016-08-19: qty 1

## 2016-08-19 MED ORDER — TRESIBA FLEXTOUCH 200 UNIT/ML ~~LOC~~ SOPN: 180.0000 [IU] | PEN_INJECTOR | Freq: Every day | SUBCUTANEOUS | Status: DC

## 2016-08-19 MED ORDER — INSULIN DEGLUDEC 200 UNIT/ML ~~LOC~~ SOPN: 40.0000 [IU] | PEN_INJECTOR | Freq: Every day | SUBCUTANEOUS | Status: DC

## 2016-08-19 MED ORDER — INSULIN DEGLUDEC 200 UNIT/ML ~~LOC~~ SOPN: 0.0000 [IU] | PEN_INJECTOR | Freq: Every day | SUBCUTANEOUS | Status: DC

## 2016-08-19 MED ORDER — INSULIN ASPART 100 UNIT/ML FLEXPEN: 50.0000 [IU] | PEN_INJECTOR | Freq: Three times a day (TID) | SUBCUTANEOUS | Status: DC

## 2016-08-19 NOTE — Progress Notes (Signed)
Patient discharged to home. Concerns addressed. IV site removed. Discharge AVS reviewed with patient

## 2016-08-19 NOTE — Progress Notes (Signed)
[facility]  ROUNDING NOTE   Subjective:  Patient has improved. BUN down to 33 and creatinine down to 1.4. Blood sugar was 219 this a.m. Patient eating breakfast.   Objective:  Vital signs in last 24 hours:  Temp:  [97.6 F (36.4 C)-98.5 F (36.9 C)] 98.5 F (36.9 C) (06/28 0855) Pulse Rate:  [84-93] 86 (06/28 0855) Resp:  [17-19] 19 (06/28 0608) BP: (109-160)/(71-103) 160/89 (06/28 0855) SpO2:  [95 %-100 %] 100 % (06/28 0855) Weight:  [103.4 kg (228 lb)-108 kg (238 lb 3.2 oz)] 108 kg (238 lb 3.2 oz) (06/27 2320)  Weight change:  Filed Weights   08/18/16 1504 08/18/16 2320  Weight: 103.4 kg (228 lb) 108 kg (238 lb 3.2 oz)    Intake/Output: I/O last 3 completed shifts: In: 1595.5 [I.V.:595.5; IV Piggyback:1000] Out: -    Intake/Output this shift:  Total I/O In: -  Out: 750 [Urine:750]  Physical Exam: General: No acute distress  Head: Normocephalic, atraumatic. Moist oral mucosal membranes  Eyes: Anicteric  Neck: Supple, trachea midline  Lungs:  Clear to auscultation, normal effort  Heart: S1S2 no rubs  Abdomen:  Soft, nontender, bowel sounds present  Extremities: No peripheral edema.  Neurologic: Awake, alert, following commands  Skin: No lesions       Basic Metabolic Panel:  Recent Labs Lab 08/18/16 1506 08/19/16 0011  NA 125* 137  K 4.5 3.5  CL 92* 107  CO2 25 26  GLUCOSE 624* 218*  BUN 41* 33*  CREATININE 2.31* 1.45*  CALCIUM 8.6* 8.3*    Liver Function Tests:  Recent Labs Lab 08/18/16 1506  AST 24  ALT 20  ALKPHOS 74  BILITOT 0.7  PROT 6.6  ALBUMIN 3.0*    Recent Labs Lab 08/18/16 1506  LIPASE 24   No results for input(s): AMMONIA in the last 168 hours.  CBC:  Recent Labs Lab 08/18/16 1506 08/19/16 0011  WBC 9.6 8.0  HGB 11.6* 10.7*  HCT 36.8* 33.1*  MCV 70.4* 69.2*  PLT 255 227    Cardiac Enzymes: No results for input(s): CKTOTAL, CKMB, CKMBINDEX, TROPONINI in the last 168 hours.  BNP: Invalid  input(s): POCBNP  CBG:  Recent Labs Lab 08/18/16 2055 08/18/16 2153 08/18/16 2234 08/19/16 0306 08/19/16 0738  GLUCAP 296* 249* 232* 222* 250*    Microbiology: Results for orders placed or performed during the hospital encounter of 08/18/16  Gastrointestinal Panel by PCR , Stool     Status: None   Collection Time: 08/19/16  1:35 AM  Result Value Ref Range Status   Campylobacter species NOT DETECTED NOT DETECTED Final   Plesimonas shigelloides NOT DETECTED NOT DETECTED Final   Salmonella species NOT DETECTED NOT DETECTED Final   Yersinia enterocolitica NOT DETECTED NOT DETECTED Final   Vibrio species NOT DETECTED NOT DETECTED Final   Vibrio cholerae NOT DETECTED NOT DETECTED Final   Enteroaggregative E coli (EAEC) NOT DETECTED NOT DETECTED Final   Enteropathogenic E coli (EPEC) NOT DETECTED NOT DETECTED Final   Enterotoxigenic E coli (ETEC) NOT DETECTED NOT DETECTED Final   Shiga like toxin producing E coli (STEC) NOT DETECTED NOT DETECTED Final   Shigella/Enteroinvasive E coli (EIEC) NOT DETECTED NOT DETECTED Final   Cryptosporidium NOT DETECTED NOT DETECTED Final   Cyclospora cayetanensis NOT DETECTED NOT DETECTED Final   Entamoeba histolytica NOT DETECTED NOT DETECTED Final   Giardia lamblia NOT DETECTED NOT DETECTED Final   Adenovirus F40/41 NOT DETECTED NOT DETECTED Final   Astrovirus NOT DETECTED  NOT DETECTED Final   Norovirus GI/GII NOT DETECTED NOT DETECTED Final   Rotavirus A NOT DETECTED NOT DETECTED Final   Sapovirus (I, II, IV, and V) NOT DETECTED NOT DETECTED Final  C difficile quick scan w PCR reflex     Status: Abnormal   Collection Time: 08/19/16  1:35 AM  Result Value Ref Range Status   C Diff antigen POSITIVE (A) NEGATIVE Final   C Diff toxin NEGATIVE NEGATIVE Final   C Diff interpretation Results are indeterminate. See PCR results.  Final  Clostridium Difficile by PCR     Status: None   Collection Time: 08/19/16  1:35 AM  Result Value Ref Range Status    Toxigenic C Difficile by pcr NEGATIVE NEGATIVE Final    Comment: Patient is colonized with non toxigenic C. difficile. May not need treatment unless significant symptoms are present.    Coagulation Studies: No results for input(s): LABPROT, INR in the last 72 hours.  Urinalysis:  Recent Labs  08/18/16 1506  COLORURINE STRAW*  LABSPEC 1.022  PHURINE 5.0  GLUCOSEU >=500*  HGBUR NEGATIVE  BILIRUBINUR NEGATIVE  KETONESUR NEGATIVE  PROTEINUR 30*  NITRITE NEGATIVE  LEUKOCYTESUR NEGATIVE      Imaging: No results found.   Medications:   . sodium chloride 100 mL/hr at 08/19/16 0531   . amLODipine  5 mg Oral Daily  . heparin  5,000 Units Subcutaneous Q8H  . insulin aspart  0-15 Units Subcutaneous TID AC & HS  . insulin aspart  22 Units Subcutaneous TID WC  . Insulin Degludec  40 Units Subcutaneous Daily  . pantoprazole (PROTONIX) IV  40 mg Intravenous Q24H   acetaminophen, docusate sodium  Assessment/ Plan:  46 y.o. male with a PMHx of Hypertension, diabetic retinopathy, diabetes mellitus type 2, hyperlipidemia, erectile dysfunction, obstructive sleep apnea, prior history of cellulitis, family history of end-stage renal disease (patient's mother on dialysis) who was admitted to [facility] on 08/18/2016 for evaluation of dizziness and found to have dehydration, hyperglycemia, and acute renal failure on chronic kidney disease stage III.  1. Acute renal failure/chronic kidney disease stage III Baseline creatinine 1.3/proteinuria.   the patient's creatinine was 2.31 upon admission. It has come down with IV fluid hydration. We encouraged the patient to maintain good by mouth fluid intake when he returns home. We will need to complete the serologic workup as well as renal ultrasound as an outpatient as he is currently being discharged. Advised patient to avoid NSAIDs.  2.  Anemia of chronic kidney disease.  Hemoglobin down a bit further today and may be delusional. Hemoglobin currently  10.7. Again we will need to complete the workup as an outpatient.  Iron saturation was appropriate 51.  3. Hypertension. Continue amlodipine 5 mg by mouth daily. He was previously on lisinopril which is okay to be restarted.  4. Given his underlying chronic kidney disease and strong family history of end-stage renal disease we recommend follow-up in the office. We plan to see the patient back in the office in one to 2 weeks.   LOS: 1 [name] 6/28/201810:54 AM

## 2016-08-19 NOTE — Progress Notes (Signed)
Inpatient Diabetes Program Recommendations  AACE/ADA: New Consensus Statement on Inpatient Glycemic Control (2015)  Target Ranges:  Prepandial:   less than 140 mg/dL      Peak postprandial:   less than 180 mg/dL (1-2 hours)      Critically ill patients:  140 - 180 mg/dL   Lab Results  Component Value Date   GLUCAP 250 (H) 08/19/2016   HGBA1C 15.6 (H) 03/06/2015    Review of Glycemic Control  Results for [name], CITRO (MRN [id number]) as of 08/19/2016 09:39  Ref. Range 08/18/2016 20:55 08/18/2016 21:53 08/18/2016 22:34 08/19/2016 03:06 08/19/2016 07:38  Glucose-Capillary Latest Ref Range: 65 - 99 mg/dL 296 (H) 249 (H) 232 (H) 222 (H) 250 (H)    Diabetes history: Type 2 Outpatient Diabetes medications: Novolog 50 units tid, Degludec 120 units qam (60 units plus 60 units in 2 different sites) qday, Metformin 500mg  bid- confirmed with patient at the bedside   Current orders for Inpatient glycemic control:  Novolog 22 units tid, Degludec 40 units qday, Novolog 0-15 units tid/hs  Inpatient Diabetes Program Recommendations:  [name] does not carry Tresiba/Degludec in house and patient has not brought his insulin from home.   Consider changing Degludec to Lantus 60 units now or encourage patient to Degludec at home this afternoon if he is discharged.    Decrease Novolog mealtime to 10 units tid and continue Novolog correction 0-15 units tid.    Change Novolog hs correction to 0-5 units.    Gentry Fitz, RN, BA, MHA, CDE Diabetes Coordinator Inpatient Diabetes Program  [phone] (Team Pager) [phone] ([place]) 08/19/2016 9:45 AM

## 2016-08-19 NOTE — Discharge Summary (Signed)
[place] [facility] [place] NAME: [name]    MR#:  [id number]  DATE OF BIRTH:  03/31/1970  DATE OF ADMISSION:  08/18/2016 ADMITTING PHYSICIAN: Vaughan Basta, MD  DATE OF DISCHARGE: 08/11/2016  PRIMARY CARE PHYSICIAN: [facility]    ADMISSION DIAGNOSIS:  Dizziness [R42] Acute renal failure (ARF) (HCC) [N17.9] Acute renal failure, unspecified acute renal failure type ([place]) [N17.9] Type 2 diabetes mellitus with hyperglycemia, with long-term current use of insulin (HCC) [E11.65, Z79.4] Uncontrolled diabetes mellitus ([place]) [E11.65]  DISCHARGE DIAGNOSIS:  Principal Problem:   AKI (acute kidney injury) ([place]) Active Problems:   Diarrhea   Uncontrolled diabetes mellitus ([place])   SECONDARY DIAGNOSIS:   Past Medical History:  Diagnosis Date  . Cellulitis   . Diabetes mellitus without complication ([place])   . Heart palpitations   . HLD (hyperlipidemia)   . Hypertension     HOSPITAL COURSE:   46 year old male with very resistant diabetes who presents with dizziness and found to have hyperglycemia and acute kidney injury.  1. Acute kidney injury in the setting of hyperglycemia and dehydration Creatinine has improved with IV fluids. GFR is greater than 60  2. Resistant diabetes: Patient was recently seen by his endocrinologist with recommendations to increase NovoLog to 6 units 3 times a day and TReSIBA to 180 units. His blood sugars are better controlled with IV fluids. He will also continue metformin.  3. Diarrhea: Patient had no diarrhea during hospital stay. His C. difficile was positive for antigen however PCR was negative.  4. Essential hypertension: Patient will continue lisinopril/HCTZ, Norvasc and Coreg.  DISCHARGE CONDITIONS AND DIET:   Stable for discharge and diabetic diet  CONSULTS OBTAINED:    DRUG ALLERGIES:   Allergies  Allergen Reactions  . Levaquin [Levofloxacin] Swelling  .  Phenergan [Promethazine Hcl] Diarrhea    DISCHARGE MEDICATIONS:   Current Discharge Medication List    CONTINUE these medications which have CHANGED   Details  insulin aspart (NOVOLOG) 100 UNIT/ML FlexPen Inject 60 Units into the skin 3 (three) times daily. Qty: 15 mL, Refills: 11    TRESIBA FLEXTOUCH 200 UNIT/ML SOPN Inject 180 Units into the skin daily. Inject up to 150 Units into the skin daily.      CONTINUE these medications which have NOT CHANGED   Details  amLODipine (NORVASC) 5 MG tablet Take 5 mg by mouth daily.    carvedilol (COREG) 6.25 MG tablet Take 1 tablet (6.25 mg total) by mouth 2 (two) times daily with a meal. Qty: 60 tablet, Refills: 0    lisinopril-hydrochlorothiazide (PRINZIDE,ZESTORETIC) 20-25 MG tablet Take 1 tablet by mouth daily.    metFORMIN (GLUCOPHAGE-XR) 500 MG 24 hr tablet Take 500 mg by mouth 2 (two) times daily.    NONFORMULARY OR COMPOUNDED ITEM [facility]  Onychomycosis Nail Lacquer -  Fluconazole 2%, Terbinafine 1% DMSO Apply to affected nail once daily Qty. 120 gm 3 refills Qty: 1 each, Refills: 0      STOP taking these medications     HYDROcodone-acetaminophen (NORCO/VICODIN) 5-325 MG tablet      lisinopril (PRINIVIL,ZESTRIL) 10 MG tablet           Today   CHIEF COMPLAINT:  Patient doing well this morning. No dizziness no chest pain or shortness of breath VITAL SIGNS:  Blood pressure (!) 160/89, pulse 86, temperature 98.5 F (36.9 C), temperature source Oral, resp. rate 19, height 6\' 3"  (1.905 m), weight 108 kg (238  lb 3.2 oz), SpO2 100 %.   REVIEW OF SYSTEMS:  Review of Systems  Constitutional: Negative.  Negative for chills, fever and malaise/fatigue.  HENT: Negative.  Negative for ear discharge, ear pain, hearing loss, nosebleeds and sore throat.   Eyes: Negative.  Negative for blurred vision and pain.  Respiratory: Negative.  Negative for cough, hemoptysis, shortness of breath and wheezing.   Cardiovascular:  Negative.  Negative for chest pain, palpitations and leg swelling.  Gastrointestinal: Negative.  Negative for abdominal pain, blood in stool, diarrhea, nausea and vomiting.  Genitourinary: Negative.  Negative for dysuria.  Musculoskeletal: Negative.  Negative for back pain.  Skin: Negative.   Neurological: Negative for dizziness, tremors, speech change, focal weakness, seizures and headaches.  Endo/Heme/Allergies: Negative.  Does not bruise/bleed easily.  Psychiatric/Behavioral: Negative.  Negative for depression, hallucinations and suicidal ideas.     PHYSICAL EXAMINATION:  GENERAL:  46 y.o.-year-old patient lying in the bed with no acute distress.  NECK:  Supple, no jugular venous distention. No thyroid enlargement, no tenderness.  LUNGS: Normal breath sounds bilaterally, no wheezing, rales,rhonchi  No use of accessory muscles of respiration.  CARDIOVASCULAR: S1, S2 normal. No murmurs, rubs, or gallops.  ABDOMEN: Soft, non-tender, non-distended. Bowel sounds present. No organomegaly or mass.  EXTREMITIES: No pedal edema, cyanosis, or clubbing.  PSYCHIATRIC: The patient is alert and oriented x 3.  SKIN: No obvious rash, lesion, or ulcer.   DATA REVIEW:   CBC  Recent Labs Lab 08/19/16 0011  WBC 8.0  HGB 10.7*  HCT 33.1*  PLT 227    Chemistries   Recent Labs Lab 08/18/16 1506 08/19/16 0011  NA 125* 137  K 4.5 3.5  CL 92* 107  CO2 25 26  GLUCOSE 624* 218*  BUN 41* 33*  CREATININE 2.31* 1.45*  CALCIUM 8.6* 8.3*  AST 24  --   ALT 20  --   ALKPHOS 74  --   BILITOT 0.7  --     Cardiac Enzymes No results for input(s): TROPONINI in the last 168 hours.  Microbiology Results  @[email or web address] @  RADIOLOGY:  No results found.    Current Discharge Medication List    CONTINUE these medications which have CHANGED   Details  insulin aspart (NOVOLOG) 100 UNIT/ML FlexPen Inject 60 Units into the skin 3 (three) times daily. Qty: 15 mL, Refills: 11    TRESIBA FLEXTOUCH  200 UNIT/ML SOPN Inject 180 Units into the skin daily. Inject up to 150 Units into the skin daily.      CONTINUE these medications which have NOT CHANGED   Details  amLODipine (NORVASC) 5 MG tablet Take 5 mg by mouth daily.    carvedilol (COREG) 6.25 MG tablet Take 1 tablet (6.25 mg total) by mouth 2 (two) times daily with a meal. Qty: 60 tablet, Refills: 0    lisinopril-hydrochlorothiazide (PRINZIDE,ZESTORETIC) 20-25 MG tablet Take 1 tablet by mouth daily.    metFORMIN (GLUCOPHAGE-XR) 500 MG 24 hr tablet Take 500 mg by mouth 2 (two) times daily.    NONFORMULARY OR COMPOUNDED ITEM [facility]  Onychomycosis Nail Lacquer -  Fluconazole 2%, Terbinafine 1% DMSO Apply to affected nail once daily Qty. 120 gm 3 refills Qty: 1 each, Refills: 0      STOP taking these medications     HYDROcodone-acetaminophen (NORCO/VICODIN) 5-325 MG tablet      lisinopril (PRINIVIL,ZESTRIL) 10 MG tablet            Management plans discussed with the patient and he  is in agreement. Stable for discharge home  Patient should follow up with pcp And his endocrinologist  CODE STATUS:     Code Status Orders        Start     Ordered   08/18/16 2315  Full code  Continuous     08/18/16 2314    Code Status History    Date Active Date Inactive Code Status Order ID Comments User Context   07/09/2016  1:41 AM 07/09/2016  5:59 PM Full Code [id number]  Vaughan Basta, MD ED   03/15/2016 10:07 PM 03/17/2016  5:53 PM Full Code [id number]  Etta Quill, DO ED   06/23/2015  9:39 PM 06/28/2015  5:33 PM Full Code [id number]  Ivor Costa, MD ED   03/07/2015  1:33 AM 03/10/2015  9:07 PM Full Code [id number]  Harrie Foreman, MD Inpatient      TOTAL TIME TAKING CARE OF THIS PATIENT: 39 minutes.    Note: This dictation was prepared with Dragon dictation along with smaller phrase technology. Any transcriptional errors that result from this process are unintentional.  Donny Heffern M.D on 08/19/2016 at  10:12 AM  Between 7am to 6pm - Pager - [phone] After 6pm go to [email or web address] - password [email or web address] [facility]  Office  [phone]  CC: Primary care physician; [facility]

## 2016-08-20 LAB — PROTEIN ELECTROPHORESIS, SERUM
A/G Ratio: 0.9 (ref 0.7–1.7)
ALBUMIN ELP: 2.6 g/dL — AB (ref 2.9–4.4)
ALPHA-2-GLOBULIN: 0.8 g/dL (ref 0.4–1.0)
Alpha-1-Globulin: 0.1 g/dL (ref 0.0–0.4)
BETA GLOBULIN: 0.8 g/dL (ref 0.7–1.3)
GAMMA GLOBULIN: 1.1 g/dL (ref 0.4–1.8)
Globulin, Total: 2.9 g/dL (ref 2.2–3.9)
Total Protein ELP: 5.5 g/dL — ABNORMAL LOW (ref 6.0–8.5)

## 2016-08-20 LAB — PROTEIN ELECTRO, RANDOM URINE
ALPHA-1-GLOBULIN, U: 1.6 %
ALPHA-2-GLOBULIN, U: 3.5 %
Albumin ELP, Urine: 79.2 %
BETA GLOBULIN, U: 7.9 %
Gamma Globulin, U: 7.9 %
Total Protein, Urine: 33.1 mg/dL

## 2016-08-20 LAB — ANA W/REFLEX IF POSITIVE: Anti Nuclear Antibody(ANA): NEGATIVE

## 2016-08-30 NOTE — Progress Notes (Signed)
Subjective: 46 year old male presents the office today for follow-up evaluation of left ankle and foot pain. Silk with pins the also asked ankle he presents to discuss the MRI results. He's remain in a CAM boot but versus intermittently. Denies any recent injury or trauma denies any recent falls. He is noted concerns. He states the ankle is doing somewhat better. Denies any systemic complaints such as fevers, chills, nausea, vomiting. No acute changes since last appointment, and no other complaints at this time.   Objective: AAO x3, NAD DP/PT pulses palpable bilaterally, CRT less than 3 seconds Incision from the prior surgery appears to be well-healed.  There continues to be mild to palpation on the lateral aspect left foot just proximal to the fifth metatarsal base and posterior to lateral malleolus and the course the peroneal tendon. The tendons appear to be intact. There is no other area of tenderness of the foot this time. Specifically there is no pain along the flexor tendons along the ankle. Medial muscle testing 5 out of 5. No open lesions or pre-ulcerative lesions. Minimal swelling to lateral aspect of ankle. There is no erythema or increase in warmth. No other areas of swelling identified. No pain with calf compression, swelling, warmth, erythema   MRI 08/04/2016 IMPRESSION: 1. Severe edema in the abductor hallucis muscle concerning for muscle strain versus myositis. 2. Muscle edema in the abductor digiti minimi muscle concerning for muscle strain versus myositis. 3. Complete tear of the distal flexor hallucis longus which is beyond the field of view likely at its insertion, but the retracted tendon is identified (image 5/ series 5).  Assessment: Peroneal tendinitis; status post amputation of the hallux.  Plan: -All treatment options discussed with the patient including all alternatives, risks, complications.  -MRI results were discussed the patient. See above. There is a complete  tear of the distal flexor hallucis longus. However he's presented amputation of this toe and maybe results of this. Overall his strength is intact and he has no pain along his area and the reason for the MRI was for the peroneal tendons. These tendons are intact on the MRI. -At this point I have recommended physical therapy to start rehabilitation were to get back to regular shoe. Prescription for physical therapy as provided today. -His orthotics were modified by Liliane Channel for the toe filler. -Patient encouraged to call the office with any questions, concerns, change in symptoms.   Celesta Gentile, DPM

## 2016-09-09 ENCOUNTER — Ambulatory Visit (INDEPENDENT_AMBULATORY_CARE_PROVIDER_SITE_OTHER): Payer: BC Managed Care – PPO | Admitting: Podiatry

## 2016-09-09 ENCOUNTER — Encounter: Payer: Self-pay | Admitting: Podiatry

## 2016-09-09 DIAGNOSIS — M79675 Pain in left toe(s): Secondary | ICD-10-CM | POA: Diagnosis not present

## 2016-09-09 DIAGNOSIS — B351 Tinea unguium: Secondary | ICD-10-CM

## 2016-09-09 DIAGNOSIS — M79674 Pain in right toe(s): Secondary | ICD-10-CM | POA: Diagnosis not present

## 2016-09-09 DIAGNOSIS — M779 Enthesopathy, unspecified: Secondary | ICD-10-CM | POA: Diagnosis not present

## 2016-09-11 ENCOUNTER — Emergency Department: Payer: BC Managed Care – PPO

## 2016-09-11 ENCOUNTER — Encounter: Payer: Self-pay | Admitting: Emergency Medicine

## 2016-09-11 ENCOUNTER — Emergency Department
Admission: EM | Admit: 2016-09-11 | Discharge: 2016-09-11 | Disposition: A | Discharge status: Home / Self Care | Payer: BC Managed Care – PPO | Attending: Emergency Medicine | Admitting: Emergency Medicine

## 2016-09-11 DIAGNOSIS — W19XXXA Unspecified fall, initial encounter: Secondary | ICD-10-CM | POA: Diagnosis not present

## 2016-09-11 DIAGNOSIS — Z7984 Long term (current) use of oral hypoglycemic drugs: Secondary | ICD-10-CM | POA: Diagnosis not present

## 2016-09-11 DIAGNOSIS — I10 Essential (primary) hypertension: Secondary | ICD-10-CM | POA: Diagnosis not present

## 2016-09-11 DIAGNOSIS — M25512 Pain in left shoulder: Secondary | ICD-10-CM | POA: Diagnosis not present

## 2016-09-11 DIAGNOSIS — Z794 Long term (current) use of insulin: Secondary | ICD-10-CM | POA: Diagnosis not present

## 2016-09-11 DIAGNOSIS — S299XXA Unspecified injury of thorax, initial encounter: Secondary | ICD-10-CM | POA: Diagnosis present

## 2016-09-11 DIAGNOSIS — M546 Pain in thoracic spine: Secondary | ICD-10-CM

## 2016-09-11 DIAGNOSIS — M869 Osteomyelitis, unspecified: Secondary | ICD-10-CM | POA: Insufficient documentation

## 2016-09-11 DIAGNOSIS — Y939 Activity, unspecified: Secondary | ICD-10-CM | POA: Insufficient documentation

## 2016-09-11 DIAGNOSIS — M8978 Major osseous defect, other site: Secondary | ICD-10-CM | POA: Diagnosis not present

## 2016-09-11 DIAGNOSIS — E1165 Type 2 diabetes mellitus with hyperglycemia: Secondary | ICD-10-CM | POA: Diagnosis not present

## 2016-09-11 DIAGNOSIS — Y999 Unspecified external cause status: Secondary | ICD-10-CM | POA: Insufficient documentation

## 2016-09-11 DIAGNOSIS — S39012A Strain of muscle, fascia and tendon of lower back, initial encounter: Secondary | ICD-10-CM | POA: Diagnosis not present

## 2016-09-11 DIAGNOSIS — Y929 Unspecified place or not applicable: Secondary | ICD-10-CM | POA: Diagnosis not present

## 2016-09-11 DIAGNOSIS — S22089A Unspecified fracture of T11-T12 vertebra, initial encounter for closed fracture: Secondary | ICD-10-CM

## 2016-09-11 DIAGNOSIS — Z89412 Acquired absence of left great toe: Secondary | ICD-10-CM | POA: Insufficient documentation

## 2016-09-11 MED ORDER — OXYCODONE-ACETAMINOPHEN 5-325 MG PO TABS
1.0000 | ORAL_TABLET | Freq: Once | ORAL | Status: AC
  Administered 2016-09-11: 1 via ORAL
  Filled 2016-09-11: qty 1

## 2016-09-11 MED ORDER — DIAZEPAM 2 MG PO TABS
2.0000 mg | ORAL_TABLET | Freq: Once | ORAL | Status: AC
  Administered 2016-09-11: 2 mg via ORAL
  Filled 2016-09-11: qty 1

## 2016-09-11 MED ORDER — CYCLOBENZAPRINE HCL 5 MG PO TABS: 5.0000 mg | ORAL_TABLET | Freq: Three times a day (TID) | ORAL | 0 refills | Status: DC | PRN

## 2016-09-11 MED ORDER — OXYCODONE-ACETAMINOPHEN 5-325 MG PO TABS: 1.0000 | ORAL_TABLET | Freq: Four times a day (QID) | ORAL | 0 refills | Status: DC | PRN

## 2016-09-11 NOTE — ED Provider Notes (Signed)
[facility] Emergency Department Provider Note   ____________________________________________   I have reviewed the triage vital signs and the nursing notes.   HISTORY  Chief Complaint Back Pain    HPI [name]. is a 46 y.o. male presents to the emergency department with thoracic and lumbar back pain, and left shoulder pain since falling yesterday. 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 Patient describes falling backwards and landing directly on his back and left shoulder. Patient reports pain that increases upright position and walking. Patient denies radicular symptoms into the upper or lower extremities. Patient reports he can range his left upper extremity however it is very painful. He denies any sense of subluxation or dislocation of the left shoulder. Patient denies any past injury to the back or left shoulder. Patient denies any changes in bowel or bladder function or saddle anesthesia. Patient denies fever, chills, headache, vision changes, chest pain, chest tightness, shortness of breath, abdominal pain, nausea and vomiting.  Past Medical History:  Diagnosis Date  . Cellulitis   . Diabetes mellitus without complication ([place])   . Heart palpitations   . HLD (hyperlipidemia)   . Hypertension     Patient Active Problem List   Diagnosis Date Noted  . Uncontrolled diabetes mellitus ([place]) 07/08/2016  . Osteomyelitis of toe of left foot ([place]) 03/15/2016  . Cellulitis, face 06/24/2015  . Facial cellulitis 06/23/2015  . AKI (acute kidney injury) ([place]) 06/23/2015  . Diarrhea 06/23/2015  . Diabetes mellitus without complication ([place])   . HLD (hyperlipidemia)   . Essential hypertension   . Sepsis ([place]) 03/07/2015    Past Surgical History:  Procedure Laterality Date  . AMPUTATION Left 03/16/2016   Procedure: AMPUTATION DIGIT LEFT HALLUX;  Surgeon:  Trula Slade, DPM;  Location: [facility];  Service: Podiatry;  Laterality: Left;  can start around 5   . ARTHROSCOPIC REPAIR ACL    . COLONOSCOPY WITH PROPOFOL N/A 10/28/2015   Procedure: COLONOSCOPY WITH PROPOFOL;  Surgeon: Lollie Sails, MD;  Location: [facility] ENDOSCOPY;  Service: Endoscopy;  Laterality: N/A;  . COLONOSCOPY WITH PROPOFOL N/A 10/29/2015   Procedure: COLONOSCOPY WITH PROPOFOL;  Surgeon: Lollie Sails, MD;  Location[facility] ENDOSCOPY;  Service: Endoscopy;  Laterality: N/A;  . PROSTATE SURGERY  2016  . TONSILECTOMY/ADENOIDECTOMY WITH MYRINGOTOMY    . TONSILLECTOMY      Prior to Admission medications   Medication Sig Start Date End Date Taking? Authorizing Provider  amLODipine (NORVASC) 5 MG tablet Take 5 mg by mouth daily. 05/26/16   [provider]  carvedilol (COREG) 6.25 MG tablet Take 1 tablet (6.25 mg total) by mouth 2 (two) times daily with a meal. 06/28/15   Bonnell Public, MD  cyclobenzaprine (FLEXERIL) 5 MG tablet Take 1 tablet (5 mg total) by mouth 3 (three) times daily as needed. 09/11/16   Derisha Funderburke M, PA-C  insulin aspart (NOVOLOG) 100 UNIT/ML FlexPen Inject 60 Units into the skin 3 (three) times daily. 08/19/16 08/19/17  Bettey Costa, MD  lisinopril-hydrochlorothiazide (PRINZIDE,ZESTORETIC) 20-25 MG tablet Take 1 tablet by mouth daily. 05/29/16   [provider]  metFORMIN (GLUCOPHAGE-XR) 500 MG 24 hr tablet Take 500 mg by mouth 2 (two) times daily. 04/20/16   [provider]  NONFORMULARY OR COMPOUNDED ITEM [facility]  Onychomycosis Nail Lacquer -  Fluconazole 2%, Terbinafine 1% DMSO Apply to affected nail once daily Qty. 120 gm  3 refills 07/15/16   Trula Slade, DPM  oxyCODONE-acetaminophen (ROXICET) 5-325 MG tablet Take 1 tablet by mouth every 6 (six) hours as needed. 09/11/16 09/11/17  Darlynn Ricco M, PA-C  TRESIBA FLEXTOUCH 200 UNIT/ML SOPN Inject 180 Units into the skin daily. Inject up to 150 Units into the skin daily.  08/19/16   Bettey Costa, MD    Allergies Levaquin [levofloxacin] and Phenergan [promethazine hcl]  Family History  Problem Relation Age of Onset  . CAD Father   . Diabetes Mellitus II Mother     Social History Social History  Substance Use Topics  . Smoking status: Never Smoker  . Smokeless tobacco: Never Used  . Alcohol use No    Review of Systems Constitutional: Negative for fever/chills Eyes: No visual changes. ENT:  Negative for sore throat and for difficulty swallowing Cardiovascular: Denies chest pain. Respiratory: Denies cough. Denies shortness of breath. Gastrointestinal: No abdominal pain.  No nausea, vomiting, diarrhea. Genitourinary: Negative for dysuria. Musculoskeletal: Positive for thoracic and lumbar back pain. Positive for left shoulder pain. Skin: Negative for rash. Neurological: Negative for headaches.  Negative focal weakness or numbness. Negative for loss of consciousness. Able to ambulate. ____________________________________________   PHYSICAL EXAM:  VITAL SIGNS: Patient Vitals for the past 24 hrs:  BP Temp Temp src Pulse Resp SpO2 Height Weight  09/11/16 1842 - - - - 18 - - -  09/11/16 1838 (!) 155/85 98.3 F (36.8 C) Oral 90 18 96 % - -  09/11/16 1508 - - - - - - 6\' 3"  (1.905 m) 104.3 kg (230 lb)  09/11/16 1507 116/75 98.4 F (36.9 C) Oral 88 20 95 % - -    Constitutional: Alert and oriented. Well appearing and in no acute distress.  Head: Normocephalic and atraumatic. Eyes: Conjunctivae are normal. PERRL. Normal extraocular movements.  Nose: No congestion/rhinorrhea/epistaxis. Mouth/Throat: Mucous membranes are moist. Neck: Supple. Hematological/Lymphatic/Immunological: No lymphadenopathy. Cardiovascular: Normal rate, regular rhythm. Normal distal pulses. Respiratory: Normal respiratory effort. No wheezes/rales/rhonchi. Lungs CTAB  Gastrointestinal: Soft and nontender. No distention. Musculoskeletal: Thoracic and lumbar pain along  spinous processes and paraspinal musculature. Increased pain with spinal range of motion. Negative radicular symptoms. No deformities noted on palpation of the thoracic or lumbar spine. Left shoulder pain with increased pain on range of motion. Range of motion limited by pain only. Left shoulder pain is global unable to localize. Nontender with normal range of motion in all extremities. Neurologic: Normal speech and language. No gross focal neurologic deficits are appreciated. No gait instability. No sensory loss or abnormal reflexes. Negative bowel or bladder dysfunction. Negative saddle anesthesia. Skin:  Skin is warm, dry and intact. No rash noted. Psychiatric: Mood and affect are normal.  ____________________________________________   LABS (all labs ordered are listed, but only abnormal results are displayed)  Labs Reviewed - No data to display ____________________________________________  EKG None ____________________________________________  RADIOLOGY DG thoracic spine IMPRESSION: No acute or traumatic finding. Ordinary mild degenerative changes.  DG lumbar spine FINDINGS: Minimal superior endplate fracture O97, not appreciable on the thoracic exam. Loss of height 10% or less. No visible retropulsion. Remainder of the lumbar region is normal.  IMPRESSION: Minimal acute superior endplate fracture at the T12 level.   DG left shoulder FINDINGS: There is no evidence of fracture or dislocation. There is no evidence of arthropathy or other focal bone abnormality. Soft tissues are unremarkable.  IMPRESSION: Negative.  ____________________________________________   PROCEDURES  Procedure(s) performed: No    Critical Care performed: no  ____________________________________________   INITIAL IMPRESSION / ASSESSMENT AND PLAN / ED COURSE  Pertinent labs & imaging results that were available during my care of the patient were reviewed by me and considered in my medical  decision making (see chart for details).  Patient presented with thoracic and lumbar back pain and left shoulder pain secondary to mechanical fall. Patient history, physical exam and imaging findings are consistent with minimal acute superior endplate fracture P22 level. Patient responded well to Valium and Percocet given during course of care in emergency department for pain and symptoms management. Patient will be given a prescription for short course of Percocet and Flexeril for continued symptoms management until he is able to follow up with orthopedics. Reassessment and vital signs were reassuring at discharge. Patient was advised to follow up with Orthopedics for continued care and was also advised to return to the emergency department for symptoms that change or worsen.     If controlled substance prescribed during this visit, 12 month history viewed on the [place] prior to issuing an initial prescription for Schedule II or III opiod. ____________________________________________   FINAL CLINICAL IMPRESSION(S) / ED DIAGNOSES  Final diagnoses:  Acute bilateral thoracic back pain  Fall, initial encounter  Acute pain of left shoulder  Closed fracture of twelfth thoracic vertebra, unspecified fracture morphology, initial encounter ([place])  Strain of lumbar region, initial encounter       NEW MEDICATIONS STARTED DURING THIS VISIT:  Discharge Medication List as of 09/11/2016  6:31 PM    START taking these medications   Details  cyclobenzaprine (FLEXERIL) 5 MG tablet Take 1 tablet (5 mg total) by mouth 3 (three) times daily as needed., Starting Sat 09/11/2016, Print    oxyCODONE-acetaminophen (ROXICET) 5-325 MG tablet Take 1 tablet by mouth every 6 (six) hours as needed., Starting Sat 09/11/2016, Until Sun 09/11/2017, Print         Note:  This document was prepared using Dragon voice recognition software and may include unintentional dictation errors.    Alric Quan 09/11/16 2317    Carrie Mew, MD 09/11/16 [phone]

## 2016-09-11 NOTE — Discharge Instructions (Signed)
Take medication as prescribed. Follow-up with orthopedics for continued care. If symptoms sniffling worsen return to emergency department.

## 2016-09-11 NOTE — ED Triage Notes (Signed)
Mid and low back pain since fell yesterday.

## 2016-09-16 ENCOUNTER — Other Ambulatory Visit (HOSPITAL_COMMUNITY): Payer: Self-pay | Admitting: Emergency Medicine

## 2016-09-16 ENCOUNTER — Other Ambulatory Visit: Payer: Self-pay | Admitting: Emergency Medicine

## 2016-09-16 DIAGNOSIS — R569 Unspecified convulsions: Secondary | ICD-10-CM

## 2016-09-16 DIAGNOSIS — R251 Tremor, unspecified: Secondary | ICD-10-CM

## 2016-09-16 DIAGNOSIS — R55 Syncope and collapse: Secondary | ICD-10-CM

## 2016-09-16 NOTE — Progress Notes (Signed)
Subjective: 46 y.o. returns the office today for painful, elongated, thickened toenails which he cannot trim himself. Denies any redness or drainage around the nails. He states that overall the left ankle is doing better and his been continuing physical therapy. He does wear the CAM boot intermittently been on all the time. He feels that he is more unbalanced because of the big toe been amputated which is causing him to fall. He denies any recent injury since his last appointment with me. The pain is imperative left ankle. Denies any acute changes since last appointment and no new complaints today. Denies any systemic complaints such as fevers, chills, nausea, vomiting.   Objective: AAO 3, NAD DP/PT pulses palpable, CRT less than 3 seconds Nails hypertrophic, dystrophic, elongated, brittle, discolored 9. There is tenderness overlying the nails 1-5 bilaterally except for the left hallux which is been amputated There is no surrounding erythema or drainage along the nail sites. There is much improved edema to the left ankle and foot. There is no erythema or increase in warmth. The tenderness along the course the peroneal tendons appears to be improved and there is minimal tenderness today. There is slight discomfort at the flexor tendon just posterior to the medial malleolus. There is no other areas of tenderness identified this time. Strength appears to be intact bilaterally. No open lesions or pre-ulcerative lesions are identified. No other areas of tenderness bilateral lower extremities. No overlying edema, erythema, increased warmth. No pain with calf compression, swelling, warmth, erythema.  Assessment: Patient presents with symptomatic onychomycosis; resolving ankle pain  Plan: -Treatment options including alternatives, risks, complications were discussed -Nails sharply debrided 9 without complication/bleeding. -Recommended continue with physical therapy. Also recommend continuing the CAM  boot but as he is progressing come out of the boot and into a regular shoe but needs to wear the insert and toe filler. He presents today wearing a flip-flop. -Discussed daily foot inspection. If there are any changes, to call the office immediately.  -Follow-up as scheduled or sooner if any problems are to arise. In the meantime, encouraged to call the office with any questions, concerns, changes symptoms.  Celesta Gentile, DPM

## 2016-09-24 ENCOUNTER — Ambulatory Visit: Payer: BC Managed Care – PPO

## 2016-09-28 ENCOUNTER — Telehealth: Payer: Self-pay | Admitting: *Deleted

## 2016-09-28 MED ORDER — NONFORMULARY OR COMPOUNDED ITEM: 0 refills | Status: DC

## 2016-09-28 NOTE — Telephone Encounter (Addendum)
-----   Message from Trula Slade, DPM sent at 09/23/2016  6:58 AM EDT ----- Please order topical onychomycosis through [facility]- please let him know it does show fungus but given previous labs would like to try to hold off on oral medication if able. Thank you.09/28/2016-Left message informing pt of Dr. Leigh Aurora review of labs and orders.

## 2016-09-30 ENCOUNTER — Ambulatory Visit
Admission: RE | Admit: 2016-09-30 | Discharge: 2016-09-30 | Disposition: A | Discharge status: Home / Self Care | Payer: BC Managed Care – PPO | Source: Ambulatory Visit | Attending: Emergency Medicine | Admitting: Emergency Medicine

## 2016-09-30 DIAGNOSIS — R251 Tremor, unspecified: Secondary | ICD-10-CM

## 2016-09-30 DIAGNOSIS — R55 Syncope and collapse: Secondary | ICD-10-CM | POA: Insufficient documentation

## 2016-09-30 DIAGNOSIS — R569 Unspecified convulsions: Secondary | ICD-10-CM

## 2016-09-30 LAB — POCT I-STAT CREATININE: CREATININE: 2.2 mg/dL — AB (ref 0.61–1.24)

## 2016-09-30 MED ORDER — GADOBENATE DIMEGLUMINE 529 MG/ML IV SOLN
20.0000 mL | Freq: Once | INTRAVENOUS | Status: AC | PRN
  Administered 2016-09-30: 20 mL via INTRAVENOUS

## 2016-11-16 ENCOUNTER — Inpatient Hospital Stay: Admission: RE | Admit: 2016-11-16 | Payer: BC Managed Care – PPO | Source: Ambulatory Visit
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
## 2016-11-18 ENCOUNTER — Ambulatory Visit: Payer: BC Managed Care – PPO | Admitting: Podiatry

## 2016-11-19 ENCOUNTER — Encounter
Admission: RE | Admit: 2016-11-19 | Discharge: 2016-11-19 | Disposition: A | Discharge status: Home / Self Care | Payer: BC Managed Care – PPO | Source: Ambulatory Visit | Attending: Urology | Admitting: Urology

## 2016-11-19 DIAGNOSIS — E119 Type 2 diabetes mellitus without complications: Secondary | ICD-10-CM | POA: Insufficient documentation

## 2016-11-19 DIAGNOSIS — Z9889 Other specified postprocedural states: Secondary | ICD-10-CM | POA: Insufficient documentation

## 2016-11-19 DIAGNOSIS — E7800 Pure hypercholesterolemia, unspecified: Secondary | ICD-10-CM | POA: Insufficient documentation

## 2016-11-19 DIAGNOSIS — I10 Essential (primary) hypertension: Secondary | ICD-10-CM | POA: Insufficient documentation

## 2016-11-19 DIAGNOSIS — Z888 Allergy status to other drugs, medicaments and biological substances status: Secondary | ICD-10-CM | POA: Insufficient documentation

## 2016-11-19 DIAGNOSIS — N529 Male erectile dysfunction, unspecified: Secondary | ICD-10-CM | POA: Insufficient documentation

## 2016-11-19 DIAGNOSIS — Z794 Long term (current) use of insulin: Secondary | ICD-10-CM | POA: Insufficient documentation

## 2016-11-19 DIAGNOSIS — Z79899 Other long term (current) drug therapy: Secondary | ICD-10-CM | POA: Insufficient documentation

## 2016-11-19 DIAGNOSIS — Z01812 Encounter for preprocedural laboratory examination: Secondary | ICD-10-CM | POA: Insufficient documentation

## 2016-11-19 LAB — BASIC METABOLIC PANEL
Anion gap: 10 (ref 5–15)
BUN: 32 mg/dL — AB (ref 6–20)
CALCIUM: 9.1 mg/dL (ref 8.9–10.3)
CHLORIDE: 99 mmol/L — AB (ref 101–111)
CO2: 26 mmol/L (ref 22–32)
CREATININE: 1.56 mg/dL — AB (ref 0.61–1.24)
GFR calc non Af Amer: 51 mL/min — ABNORMAL LOW (ref >60)
GFR, EST AFRICAN AMERICAN: 59 mL/min — AB (ref >60)
GLUCOSE: 430 mg/dL — AB (ref 65–99)
Potassium: 4.4 mmol/L (ref 3.5–5.1)
Sodium: 135 mmol/L (ref 135–145)

## 2016-11-19 LAB — CBC
HCT: 36.7 % — ABNORMAL LOW (ref 40.0–52.0)
Hemoglobin: 11.8 g/dL — ABNORMAL LOW (ref 13.0–18.0)
MCH: 22.7 pg — AB (ref 26.0–34.0)
MCHC: 32.2 g/dL (ref 32.0–36.0)
MCV: 70.3 fL — AB (ref 80.0–100.0)
PLATELETS: 256 10*3/uL (ref 150–440)
RBC: 5.22 MIL/uL (ref 4.40–5.90)
RDW: 14.2 % (ref 11.5–14.5)
WBC: 8.5 10*3/uL (ref 3.8–10.6)

## 2016-11-19 LAB — SURGICAL PCR SCREEN
MRSA, PCR: NEGATIVE
Staphylococcus aureus: NEGATIVE

## 2016-11-19 NOTE — Pre-Procedure Instructions (Signed)
Phone call to Dr. Yves Dill office to inform of patient allergy to Levaquin which was ordered by the doctor on the pre-operative patient order form. Office is closed until Monday. Note left for pre-admission testing nurse to notify Dr. Yves Dill on Monday that the patient has a Levaquin allergy and ask if the doctor still wants patient to have this for surgery.

## 2016-11-19 NOTE — Patient Instructions (Signed)
Your procedure is scheduled on: Tuesday, November 23, 2016 Report to Same Day Surgery on the 2nd floor in the [facility]. To find out your arrival time, please call [phone] between 1PM - 3PM on: Monday, November 22, 2016  REMEMBER: Instructions that are not followed completely may result in serious medical risk up to and including death; or upon the discretion of your surgeon and anesthesiologist your surgery may need to be rescheduled.  Do not eat food or drink liquids after midnight. No gum chewing or hard candies.  You may however, drink WATER ONLY up to 2 hours before you are scheduled to arrive at the hospital for your procedure.  Do not drink WATER within 2 hours of your scheduled arrival to the hospital as this may lead to your procedure being delayed or rescheduled.  No Alcohol for 24 hours before or after surgery.  No Smoking for 24 hours prior to surgery.  Notify your doctor if there is any change in your medical condition (cold, fever, infection).  Do not wear jewelry, make-up, hairpins, clips or nail polish.  Do not wear lotions, powders, or perfumes.   Do not shave 48 hours prior to surgery. Men may shave face and neck.  Contacts and dentures may not be worn into surgery.  Do not bring valuables to the hospital. [facility] is not responsible for any belongings or valuables.   TAKE THESE MEDICATIONS THE MORNING OF SURGERY WITH A SIP OF WATER:  1.  AMLODIPINE 2.  CARVEDILOL   Stop Metformin 2 days prior to surgery. (LAST DOSE ON Saturday, November 20, 2016); Continue after surgery.  Take 1/2 of usual insulin dose the day before surgery Tyler Aas insulin:  Take 125 units) and none on the morning of surgery.  Stop Anti-inflammatories such as Advil, Aleve, Ibuprofen, Motrin, Naproxen, Naprosyn, Goodie powder, or aspirin products. (May take Tylenol or Acetaminophen if needed.)  Stop over the counter supplements until after surgery.   If you are being discharged  the day of surgery, you will not be allowed to drive home. You will need someone to drive you home and stay with you that night.   If you are taking public transportation, you will need to have a responsible adult to with you.  Please call the number above if you have any questions about these instructions.

## 2016-11-22 NOTE — Pre-Procedure Instructions (Signed)
Called Dr [facility] office regarding medical clearance.  "I will look into it and call you back."

## 2016-11-22 NOTE — Pre-Procedure Instructions (Signed)
Received call from [name], physician not able to provide clearance for the patient,  Dr [facility] offfice notified.  Patient cancelled.

## 2016-11-23 ENCOUNTER — Ambulatory Visit: Admission: RE | Admit: 2016-11-23 | Payer: BC Managed Care – PPO | Source: Ambulatory Visit | Admitting: Urology

## 2016-11-23 ENCOUNTER — Encounter: Admission: RE | Payer: Self-pay | Source: Ambulatory Visit

## 2016-11-23 SURGERY — INSERTION, PENILE PROSTHESIS
Anesthesia: Choice

## 2016-12-20 ENCOUNTER — Other Ambulatory Visit (HOSPITAL_COMMUNITY): Payer: Self-pay | Admitting: Nephrology

## 2016-12-20 DIAGNOSIS — N183 Chronic kidney disease, stage 3 unspecified: Secondary | ICD-10-CM

## 2017-01-24 ENCOUNTER — Encounter: Payer: BC Managed Care – PPO | Attending: Physician Assistant | Admitting: Physician Assistant

## 2017-01-24 ENCOUNTER — Other Ambulatory Visit
Admission: RE | Admit: 2017-01-24 | Discharge: 2017-01-24 | Disposition: A | Discharge status: Home / Self Care | Payer: BC Managed Care – PPO | Source: Ambulatory Visit | Attending: Physician Assistant | Admitting: Physician Assistant

## 2017-01-24 DIAGNOSIS — S80921A Unspecified superficial injury of right lower leg, initial encounter: Secondary | ICD-10-CM | POA: Diagnosis not present

## 2017-01-24 DIAGNOSIS — L02415 Cutaneous abscess of right lower limb: Secondary | ICD-10-CM | POA: Insufficient documentation

## 2017-01-24 DIAGNOSIS — E11622 Type 2 diabetes mellitus with other skin ulcer: Secondary | ICD-10-CM | POA: Insufficient documentation

## 2017-01-24 DIAGNOSIS — X58XXXA Exposure to other specified factors, initial encounter: Secondary | ICD-10-CM | POA: Diagnosis not present

## 2017-01-24 DIAGNOSIS — I129 Hypertensive chronic kidney disease with stage 1 through stage 4 chronic kidney disease, or unspecified chronic kidney disease: Secondary | ICD-10-CM | POA: Diagnosis not present

## 2017-01-24 DIAGNOSIS — A4901 Methicillin susceptible Staphylococcus aureus infection, unspecified site: Secondary | ICD-10-CM | POA: Diagnosis not present

## 2017-01-24 DIAGNOSIS — L97812 Non-pressure chronic ulcer of other part of right lower leg with fat layer exposed: Secondary | ICD-10-CM | POA: Insufficient documentation

## 2017-01-24 DIAGNOSIS — Z1620 Resistance to unspecified antibiotic: Secondary | ICD-10-CM | POA: Diagnosis not present

## 2017-01-24 DIAGNOSIS — Z794 Long term (current) use of insulin: Secondary | ICD-10-CM | POA: Insufficient documentation

## 2017-01-24 DIAGNOSIS — N183 Chronic kidney disease, stage 3 (moderate): Secondary | ICD-10-CM | POA: Diagnosis not present

## 2017-01-24 DIAGNOSIS — E1122 Type 2 diabetes mellitus with diabetic chronic kidney disease: Secondary | ICD-10-CM | POA: Diagnosis not present

## 2017-01-24 DIAGNOSIS — Z89412 Acquired absence of left great toe: Secondary | ICD-10-CM | POA: Insufficient documentation

## 2017-01-24 NOTE — Progress Notes (Signed)
[name], [name] ([id number]) Visit Report for 01/24/2017 Abuse/Suicide Risk Screen Details Patient Name: [name], [name] Date of Service: 01/24/2017 10:30 AM Medical Record Number: [id number] Patient Account Number: [id number] Date of Birth/Sex: January 06, 1971 (46 y.o. Male) Treating RN: Ahmed Prima Primary Care Adrianna Dudas: [name] Other Clinician: Referring Mikella Linsley: [name] Treating Duayne Brideau/Extender: STONE III, HOYT Weeks in Treatment: 0 Abuse/Suicide Risk Screen Items Answer ABUSE/SUICIDE RISK SCREEN: Has anyone close to you tried to hurt or harm you recentlyo No Do you feel uncomfortable with anyone in your familyo No Has anyone forced you do things that you didnot want to doo No Do you have any thoughts of harming yourselfo No Patient displays signs or symptoms of abuse and/or neglect. No Electronic Signature(s) Signed: 01/24/2017 4:03:37 PM By: Alric Quan Entered By: Alric Quan on 01/24/2017 10:47:37 [name] ([id number]) -------------------------------------------------------------------------------- Activities of Daily Living Details Patient Name: [name] Date of Service: 01/24/2017 10:30 AM Medical Record Number: [id number] Patient Account Number: [id number] Date of Birth/Sex: [birth year] (46 y.o. Male) Treating RN: Ahmed Prima Primary Care Carolynne Schuchard: [name] Other Clinician: Referring Windi Toro: [name] Treating Vitoria Conyer/Extender: Melburn Hake, HOYT Weeks in Treatment: 0 Activities of Daily Living Items Answer Activities of Daily Living (Please select one for each item) Drive Automobile Completely Able Take Medications Completely Able Use Telephone Completely Able Care for Appearance Completely Able Use Toilet Completely Able Bath / Shower Completely Able Dress Self Completely Able Feed Self Completely Able Walk Completely Able Get In / Out Bed Completely [place] for Self Completely Able Electronic Signature(s) Signed: 01/24/2017 4:03:37 PM By: Alric Quan Entered By: Alric Quan on 01/24/2017 10:47:56 [name] ([id number]) -------------------------------------------------------------------------------- Education Assessment Details Patient Name: [name] Date of Service: 01/24/2017 10:30 AM Medical Record Number: [id number] Patient Account Number: [id number] Date of Birth/Sex: 12-06-1970 (46 y.o. Male) Treating RN: Ahmed Prima Primary Care Celena Lanius: [name] Other Clinician: Referring Morgaine Kimball: [name] Treating Tanay Misuraca/Extender: Melburn Hake, HOYT Weeks in Treatment: 0 Primary Learner Assessed: Patient Learning Preferences/Education Level/Primary Language Learning Preference: Explanation, Printed Material Highest Education Level: High School Preferred Language: English Cognitive Barrier Assessment/Beliefs Language Barrier: No Translator Needed: No Memory Deficit: No Emotional Barrier: No Cultural/Religious Beliefs Affecting Medical Care: No Physical Barrier Assessment Impaired Vision: Yes diabetic retinopathy Impaired Hearing: No Decreased Hand dexterity: No Knowledge/Comprehension Assessment Knowledge Level: Medium Comprehension Level: Medium Ability to understand written Medium instructions: Ability to understand verbal Medium instructions: Motivation Assessment Anxiety Level: Calm Cooperation: Cooperative Education Importance: Acknowledges Need Interest in Health Problems: Asks Questions Perception: Coherent Willingness to Engage in Self- Medium Management Activities: Readiness to Engage in Self- Medium Management Activities: Electronic Signature(s) Signed: 01/24/2017 4:03:37 PM By: Alric Quan Entered By: Alric Quan on 01/24/2017 10:49:01 [name]  ([id number]) -------------------------------------------------------------------------------- Fall Risk Assessment Details Patient Name: [name] Date of Service: 01/24/2017 10:30 AM Medical Record Number: [id number] Patient Account Number: [id number] Date of Birth/Sex: June 05, 1970 (46 y.o. Male) Treating RN: Ahmed Prima Primary Care Janece Laidlaw: [name] Other Clinician: Referring Janathan Bribiesca: [name] Treating Dessirae Scarola/Extender: Melburn Hake, HOYT Weeks in Treatment: 0 Fall Risk Assessment Items Have you had 2 or more falls in the last 12 monthso 0 Yes Have you had any fall that resulted in injury in the last 12 monthso 0 Yes FALL RISK ASSESSMENT: History of falling - immediate or within 3 months 25 Yes Secondary diagnosis 15 Yes Ambulatory aid None/bed rest/wheelchair/nurse 0 No Crutches/cane/walker 0 No Furniture 0 No IV Access/Saline Lock 0  No Gait/Training Normal/bed rest/immobile 0 No Weak 10 Yes Impaired 20 Yes Mental Status Oriented to own ability 0 Yes Electronic Signature(s) Signed: 01/24/2017 4:03:37 PM By: Alric Quan Entered By: Alric Quan on 01/24/2017 10:49:29 [name] ([id number]) -------------------------------------------------------------------------------- Foot Assessment Details Patient Name: [name] Date of Service: 01/24/2017 10:30 AM Medical Record Number: [id number] Patient Account Number: [id number] Date of Birth/Sex: 1970-12-21 (46 y.o. Male) Treating RN: Ahmed Prima Primary Care Giovanie Lefebre: [name] Other Clinician: Referring Shawndell Schillaci: [name] Treating Dmya Long/Extender: STONE III, HOYT Weeks in Treatment: 0 Foot Assessment Items Site Locations + = Sensation present, - = Sensation absent, C = Callus, U = Ulcer R = Redness, W = Warmth, M = Maceration, PU = Pre-ulcerative lesion F = Fissure, S = Swelling, D = Dryness Assessment Right: Left: Other Deformity: No No Prior Foot Ulcer: No  No Prior Amputation: No No Charcot Joint: No No Ambulatory Status: Ambulatory Without Help Gait: Steady Electronic Signature(s) Signed: 01/24/2017 4:03:37 PM By: Alric Quan Entered By: Alric Quan on 01/24/2017 10:52:12 [name] ([id number]) -------------------------------------------------------------------------------- Nutrition Risk Assessment Details Patient Name: [name] Date of Service: 01/24/2017 10:30 AM Medical Record Number: [id number] Patient Account Number: [id number] Date of Birth/Sex: [birth year] (46 y.o. Male) Treating RN: Ahmed Prima Primary Care Neola Worrall: [name] Other Clinician: Referring Hafiz Irion: [name] Treating Kimm Sider/Extender: STONE III, HOYT Weeks in Treatment: 0 Height (in): 75 Weight (lbs): 240.8 Body Mass Index (BMI): 30.1 Nutrition Risk Assessment Items NUTRITION RISK SCREEN: I have an illness or condition that made me change the kind and/or amount of 0 No food I eat I eat fewer than two meals per day 0 No I eat few fruits and vegetables, or milk products 0 No I have three or more drinks of beer, liquor or wine almost every day 0 No I have tooth or mouth problems that make it hard for me to eat 0 No I don't always have enough money to buy the food I need 0 No I eat alone most of the time 0 No I take three or more different prescribed or over-the-counter drugs a day 1 Yes Without wanting to, I have lost or gained 10 pounds in the last six months 2 Yes I am not always physically able to shop, cook and/or feed myself 0 No Nutrition Protocols Good Risk Protocol Moderate Risk Protocol Electronic Signature(s) Signed: 01/24/2017 4:03:37 PM By: Alric Quan Entered By: Alric Quan on 01/24/2017 10:49:51

## 2017-01-25 NOTE — Progress Notes (Addendum)
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 left great toe I10 - Essential (primary) hypertension Plan Wound Cleansing: Wound #6 Right,Anterior Lower Leg: Clean wound with Normal Saline. Cleanse wound with mild soap and water May Shower, gently pat wound dry prior to applying new dressing. Anesthetic: Wound #6 Right,Anterior Lower Leg: Topical Lidocaine 4% cream applied to wound bed prior to debridement Skin Barriers/Peri-Wound Care: Wound #6 Right,Anterior Lower Leg: Skin Prep Primary Wound Dressing: Wound #6 Right,Anterior Lower Leg: Silvercel Non-Adherent - rope Secondary Dressing: Wound #6 Right,Anterior Lower Leg: Other - [place] Dressing Change Frequency: Wound #6 Right,Anterior Lower Leg: Change dressing every day. Follow-up Appointments: AVYAY, COGER ([id number]) Wound #6 Right,Anterior Lower Leg: Return Appointment in 1 week. Edema Control: Wound #6 Right,Anterior Lower Leg: Elevate legs to the level of the heart and pump ankles as often as possible Additional Orders / Instructions: Wound #6 Right,Anterior Lower Leg: Increase protein intake. Medications-please add to medication list.: Wound #6 Right,Anterior Lower Leg: P.O. Antibiotics - continue  your antibiotics as prescribed Laboratory ordered were: Wound culture routine At this point I'm gonna recommend that for the time being patient continue with the Augmentin although we may need to switch him to a different antibiotic if this turns out to be a bursa infection as Augmentin is resistant. Nonetheless we are going to initiate a silver alginate dressing and will be packing into the abscess location which branches off laterally from the wound opening. Fortunately I was able to clean out and express the majority of the purulent discharge today which I think will aid in healing. We will see were things stand in one weeks time when we see him for reevaluation. Please see above for specific wound care orders. We will see patient for re-evaluation in 1 week here in the clinic. If anything worsens or changes patient will contact our office for additional recommendations. Unfortunately patient's blood sugars are poorly controlled and he tells me that his blood sugars typically range daily between 300 and 400. I do believe that this is something that can delay his healing and does need to be addressed in this was discussed today. Electronic Signature(s) Signed: 02/11/2017 10:24:34 AM By: Gretta Cool, BSN, RN, CWS, Kim RN, BSN Signed: 02/13/2017 1:59:56 AM By: Worthy Keeler PA-C Previous Signature: 01/24/2017 5:27:57 PM Version By: Worthy Keeler PA-C Entered By:  Gretta Cool, BSN, RN, CWS, Kim on 02/11/2017 10:24:34 IZEN, PETZ ([id number]) -------------------------------------------------------------------------------- ROS/PFSH Details Patient Name: BACH, ROCCHI Date of Service: 01/24/2017 10:30 AM Medical Record Number: [id number] Patient Account Number: [id number] Date of Birth/Sex: [birth year] (46 y.o. Male) Treating RN: Ahmed Prima Primary Care Provider: [name] Other Clinician: Referring Provider: [name] Treating Provider/Extender: Melburn Hake, Mattthew Ziomek Weeks in Treatment:  0 Information Obtained From Patient Wound History Do you currently have one or more open woundso Yes How many open wounds do you currently haveo 1 Approximately how long have you had your woundso 2 weeks How have you been treating your wound(s) until nowo amoxicillin Has your wound(s) ever healed and then re-openedo No Have you had any lab work done in the past montho No Have you tested positive for an antibiotic resistant organism (MRSA, VRE)o Yes Have you had any tests for circulation on your legso Yes Where was the test doneo [facility] Have you had other problems associated with your woundso Swelling Gastrointestinal Complaints and Symptoms: Positive for: Frequent diarrhea Medical History: Negative for: Cirrhosis ; Colitis; Crohnos; Hepatitis A; Hepatitis B; Hepatitis C Integumentary (Skin) Complaints and Symptoms: Positive for: Wounds Medical History: Negative for: History of Burn; History of pressure wounds Psychiatric Complaints and Symptoms: Positive for: Anxiety Medical History: Negative for: Anorexia/bulimia; Confinement Anxiety Constitutional Symptoms (General Health) Complaints and Symptoms: No Complaints or Symptoms Eyes Complaints and Symptoms: Review of System Notes: diabetic retinopathy Medical HistoryESTEPHAN, GALLARDO ([id number]) Negative for: Cataracts; Glaucoma; Optic Neuritis Ear/Nose/Mouth/Throat Complaints and Symptoms: No Complaints or Symptoms Medical History: Negative for: Chronic sinus problems/congestion; Middle ear problems Hematologic/Lymphatic Complaints and Symptoms: No Complaints or Symptoms Medical History: Negative for: Anemia; Hemophilia; Human Immunodeficiency Virus; Lymphedema; Sickle Cell Disease Respiratory Complaints and Symptoms: No Complaints or Symptoms Medical History: Negative for: Aspiration; Asthma; Chronic Obstructive Pulmonary Disease (COPD); Pneumothorax; Sleep Apnea; Tuberculosis Cardiovascular Complaints and  Symptoms: Review of System Notes: hyperglycemia Medical History: Positive for: Hypertension Negative for: Angina; Arrhythmia; Congestive Heart Failure; Coronary Artery Disease; Deep Vein Thrombosis; Hypotension; Myocardial Infarction; Peripheral Arterial Disease; Peripheral Venous Disease; Phlebitis; Vasculitis Endocrine Medical History: Positive for: Type II Diabetes Negative for: Type I Diabetes Time with diabetes: 7 years Treated with: Insulin Blood sugar tested every day: Yes Tested : before eat Genitourinary Complaints and Symptoms: Review of System Notes: BPH erectile dysfunction Medical History: Negative for: End Stage Renal Disease Immunological Moyers, Won ([id number]) Complaints and Symptoms: No Complaints or Symptoms Medical History: Negative for: Lupus Erythematosus; Raynaudos; Scleroderma Musculoskeletal Complaints and Symptoms: No Complaints or Symptoms Medical History: Negative for: Gout; Rheumatoid Arthritis; Osteoarthritis; Osteomyelitis Neurologic Complaints and Symptoms: No Complaints or Symptoms Medical History: Negative for: Dementia; Neuropathy; Quadriplegia; Paraplegia; Seizure Disorder Oncologic Complaints and Symptoms: No Complaints or Symptoms Medical History: Negative for: Received Chemotherapy; Received Radiation Immunizations Pneumococcal Vaccine: Received Pneumococcal Vaccination: No Implantable Devices Hospitalization / Surgery History Name of Hospital Purpose of Hospitalization/Surgery Date [facility] cell[facility]itis on face 06/23/2015 Family and Social History Cancer: Yes - Paternal Grandparents; Diabetes: Yes - Paternal Grandparents,Maternal Grandparents,Mother; Heart Disease: Yes - Father; Hypertension: Yes - Mother,Father; Kidney Disease: Yes - Mother; Lung Disease: No; Seizures: No; Stroke: Yes - Father; Thyroid Problems: No; Tuberculosis: No; Never smoker; Marital Status - Married; Alcohol Use: Never; Drug Use: No History; Caffeine  Use: Moderate; Advanced Directives: No; Patient does not want information on Advanced Directives; Do not resuscitate: No; Living Will: No; Medical Power of Attorney: No Electronic Signature(s) Signed: 01/24/2017 4:03:37 PM By: Alric Quan Signed: 01/24/2017 5:27:57 PM By: Joaquim Lai  III, Lottie Siska PA-C Entered By: Alric Quan on 01/24/2017 10:47:29 [name] ([id number]) -------------------------------------------------------------------------------- SuperBill Details Patient Name: [name] Date of Service: 01/24/2017 Medical Record Number: [id number] Patient Account Number: [id number] Date of Birth/Sex: 1971-01-22 (46 y.o. Male) Treating RN: Ahmed Prima Primary Care Provider: [name] Other Clinician: Referring Provider: [name] Treating Provider/Extender: Melburn Hake, Jayziah Bankhead Weeks in Treatment: 0 Diagnosis Coding ICD-10 Codes Code Description [phone] Non-pressure chronic ulcer of other part of right lower leg with fat layer exposed L02.415 Cutaneous abscess of right lower limb E11.622 Type 2 diabetes mellitus with other skin ulcer N18.3 Chronic kidney disease, stage 3 (moderate) Z89.412 Acquired absence of left great toe I10 Essential (primary) hypertension Facility Procedures CPT4 Code: [id number] Description: 99214 - WOUND CARE VISIT-LEV 4 EST PT Modifier: Quantity: 1 Physician Procedures CPT4 Code Description: [id number] 99214 - WC PHYS LEVEL 4 - EST PT ICD-10 Diagnosis Description L97.812 Non-pressure chronic ulcer of other part of right lower leg wi L02.415 Cutaneous abscess of right lower limb E11.622 Type 2 diabetes mellitus with other  skin ulcer N18.3 Chronic kidney disease, stage 3 (moderate) Modifier: th fat layer expo Quantity: 1 sed Electronic Signature(s) Signed: 01/24/2017 1:40:14 PM By: Alric Quan Signed: 01/24/2017 5:27:57 PM By: Worthy Keeler PA-C Entered By: Alric Quan on 01/24/2017 13:40:13

## 2017-01-26 NOTE — Progress Notes (Signed)
[name] ([id number]) Visit Report for 01/24/2017 Allergy List Details Patient Name: [name], Macdonald Date of Service: 01/24/2017 10:30 AM Medical Record Number: [id number] Patient Account Number: [id number] Date of Birth/Sex: 24-Jul-1970 (46 y.o. Male) Treating RN: Ahmed Prima Primary Care Jamyia Fortune: [name] Other Clinician: Referring Ayiden Milliman: [name] Treating Maysa Lynn/Extender: [name], [name] Weeks in Treatment: 0 Allergies Active Allergies Levaquin Phenergan Allergy Notes Electronic Signature(s) Signed: 01/24/2017 4:03:37 PM By: Alric Quan Entered By: Alric Quan on 01/24/2017 10:41:02 [name] ([id number]) -------------------------------------------------------------------------------- Arrival Information Details Patient Name: [name] Date of Service: 01/24/2017 10:30 AM Medical Record Number: [id number] Patient Account Number: [id number] Date of Birth/Sex: 1970/11/03 (47 y.o. Male) Treating RN: Ahmed Prima Primary Care Azarian Starace: [name] Other Clinician: Referring Mckenzy Salazar: [name] Treating Rithwik Schmieg/Extender: Melburn Hake, [name] Weeks in Treatment: 0 Visit Information Patient Arrived: Ambulatory Arrival Time: 10:36 Accompanied By: self Transfer Assistance: None Patient Identification Verified: Yes Secondary Verification Process Yes Completed: Patient Requires Transmission-Based No Precautions: Patient Has Alerts: Yes Patient Alerts: DM II R ABI 1.23 from EPIC L ABI 1.21 from EPIC History Since Last Visit All ordered tests and consults were completed: No Added or deleted any medications: No Any new allergies or adverse reactions: No Had a fall or experienced change in activities of daily living that may affect risk of falls: No Signs or symptoms of abuse/neglect since last visito No Hospitalized since last visit: No Electronic Signature(s) Signed: 01/24/2017 1:38:04 PM By: Alric Quan Entered  By: Alric Quan on 01/24/2017 13:38:03 [name] ([id number]) -------------------------------------------------------------------------------- Clinic Level of Care Assessment Details Patient Name: [name] Date of Service: 01/24/2017 10:30 AM Medical Record Number: [id number] Patient Account Number: [id number] Date of Birth/Sex: Apr 19, 1970 (46 y.o. Male) Treating RN: Ahmed Prima Primary Care Jasmynn Pfalzgraf: [name] Other Clinician: Referring Krew Hortman: [name] Treating Shewanda Sharpe/Extender: [name], [name] Weeks in Treatment: 0 Clinic Level of Care Assessment Items TOOL 2 Quantity Score X - Use when only an EandM is performed on the INITIAL visit 1 0 ASSESSMENTS - Nursing Assessment / Reassessment X - General Physical Exam (combine w/ comprehensive assessment (listed just below) when 1 20 performed on new pt. evals) X- 1 25 Comprehensive Assessment (HX, ROS, Risk Assessments, Wounds Hx, etc.) ASSESSMENTS - Wound and Skin Assessment / Reassessment X - Simple Wound Assessment / Reassessment - one wound 1 5 []  - 0 Complex Wound Assessment / Reassessment - multiple wounds []  - 0 Dermatologic / Skin Assessment (not related to wound area) ASSESSMENTS - Ostomy and/or Continence Assessment and Care []  - Incontinence Assessment and Management 0 []  - 0 Ostomy Care Assessment and Management (repouching, etc.) PROCESS - Coordination of Care X - Simple Patient / Family Education for ongoing care 1 15 []  - 0 Complex (extensive) Patient / Family Education for ongoing care []  - 0 Staff obtains Programmer, systems, Records, Test Results / Process Orders []  - 0 Staff telephones HHA, Nursing Homes / Clarify orders / etc []  - 0 Routine Transfer to another Facility (non-emergent condition) []  - 0 Routine Hospital Admission (non-emergent condition) X- 1 15 New Admissions / Biomedical engineer / Ordering NPWT, Apligraf, etc. []  - 0 Emergency Hospital Admission (emergent  condition) X- 1 10 Simple Discharge Coordination []  - 0 Complex (extensive) Discharge Coordination PROCESS - Special Needs []  - Pediatric / Minor Patient Management 0 []  - 0 Isolation Patient Management Macdonald, [name] ([id number]) []  - 0 Hearing / Language / Visual special needs []  - 0 Assessment of Community assistance (transportation, D/C planning, etc.) []  - 0  Additional assistance / Altered mentation []  - 0 Support Surface(s) Assessment (bed, cushion, seat, etc.) INTERVENTIONS - Wound Cleansing / Measurement X - Wound Imaging (photographs - any number of wounds) 1 5 []  - 0 Wound Tracing (instead of photographs) X- 1 5 Simple Wound Measurement - one wound []  - 0 Complex Wound Measurement - multiple wounds X- 1 5 Simple Wound Cleansing - one wound []  - 0 Complex Wound Cleansing - multiple wounds INTERVENTIONS - Wound Dressings X - Small Wound Dressing one or multiple wounds 1 10 []  - 0 Medium Wound Dressing one or multiple wounds []  - 0 Large Wound Dressing one or multiple wounds []  - 0 Application of Medications - injection INTERVENTIONS - Miscellaneous []  - External ear exam 0 X- 1 5 Specimen Collection (cultures, biopsies, blood, body fluids, etc.) X- 1 5 Specimen(s) / Culture(s) sent or taken to Lab for analysis []  - 0 Patient Transfer (multiple staff / Civil Service fast streamer / Similar devices) []  - 0 Simple Staple / Suture removal (25 or less) []  - 0 Complex Staple / Suture removal (26 or more) []  - 0 Hypo / Hyperglycemic Management (close monitor of Blood Glucose) []  - 0 Ankle / Brachial Index (ABI) - do not check if billed separately Has the patient been seen at the hospital within the last three years: Yes Total Score: 125 Level Of Care: New/Established - Level 4 Electronic Signature(s) Signed: 01/24/2017 4:03:37 PM By: Alric Quan Entered By: Alric Quan on 01/24/2017 13:40:04 [name]  ([id number]) -------------------------------------------------------------------------------- Encounter Discharge Information Details Patient Name: [name] Date of Service: 01/24/2017 10:30 AM Medical Record Number: [id number] Patient Account Number: [id number] Date of Birth/Sex: 1970-06-04 (46 y.o. Male) Treating RN: Ahmed Prima Primary Care Rodricus Candelaria: [name] Other Clinician: Referring Cotton Beckley: [name] Treating Shandy Checo/Extender: Melburn Hake, [name] Weeks in Treatment: 0 Encounter Discharge Information Items Discharge Pain Level: 6 Discharge Condition: Stable Ambulatory Status: Ambulatory Discharge Destination: Home Transportation: Private Auto Accompanied By: self Schedule Follow-up Appointment: Yes Medication Reconciliation completed and No provided to Patient/Care Aailyah Dunbar: Provided on Clinical Summary of Care: 01/24/2017 Form Type Recipient Paper Patient AB Electronic Signature(s) Signed: 01/25/2017 4:48:37 PM By: Ruthine Dose Previous Signature: 01/24/2017 11:13:32 AM Version By: Alric Quan Entered By: Ruthine Dose on 01/24/2017 11:38:26 [name] ([id number]) -------------------------------------------------------------------------------- Lower Extremity Assessment Details Patient Name: [name] Date of Service: 01/24/2017 10:30 AM Medical Record Number: [id number] Patient Account Number: [id number] Date of Birth/Sex: 1970/04/21 (46 y.o. Male) Treating RN: Ahmed Prima Primary Care Deaundra Dupriest: [name] Other Clinician: Referring Samaria Anes: [name] Treating Spyridon Hornstein/Extender: [name], [name] Weeks in Treatment: 0 Edema Assessment Assessed: [Left: No] [Right: No] Edema: [Left: Ye] [Right: s] Calf Left: Right: Point of Measurement: 39 cm From Medial Instep cm 41.1 cm Ankle Left: Right: Point of Measurement: 11 cm From Medial Instep cm 26.6 cm Vascular Assessment Claudication: Claudication Assessment  [Left:Intermittent] [Right:Rest Pain] Pulses: Dorsalis Pedis Palpable: [Right:Yes] Posterior Tibial Extremity colors, hair growth, and conditions: Extremity Color: [Left:Hyperpigmented] [Right:Hyperpigmented] Hair Growth on Extremity: [Left:Yes] [Right:Yes] Temperature of Extremity: [Right:Warm] Capillary Refill: [Right:< 3 seconds] Toe Nail Assessment Left: Right: Thick: No Discolored: Yes Deformed: No Improper Length and Hygiene: Yes Electronic Signature(s) Signed: 01/24/2017 4:03:37 PM By: Alric Quan Entered By: Alric Quan on 01/24/2017 10:55:36 [name] ([id number]) -------------------------------------------------------------------------------- Multi Wound Chart Details Patient Name: [name] Date of Service: 01/24/2017 10:30 AM Medical Record Number: [id number] Patient Account Number: [id number] Date of Birth/Sex: January 11, 1971 (46 y.o. 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] Comorbid History: [6:Hypertension, Type II Diabetes N/A] Date Acquired: [6:01/10/2017] [N/A:N/A] Weeks of Treatment: [6:0] [N/A:N/A] Wound Status: [6:Open] [N/A:N/A] Measurements L x W x D [6:1x1.5x0.2] [N/A:N/A] (cm) Area (cm) : [6:1.178] [N/A:N/A] Volume (cm) : [6:0.236] [N/A:N/A] Classification: [6:Grade 2] [N/A:N/A] Exudate Amount: [6:Large] [N/A:N/A] Exudate Type: [6:Serous] [N/A:N/A] Exudate Color: [6:amber] [N/A:N/A] Wound  Margin: [6:Distinct, outline attached] [N/A:N/A] Granulation Amount: [6:Medium (34-66%)] [N/A:N/A] Granulation Quality: [6:Red] [N/A:N/A] Necrotic Amount: [6:Medium (34-66%)] [N/A:N/A] Epithelialization: [6:None] [N/A:N/A] Periwound Skin Texture: [6:No Abnormalities Noted] [N/A:N/A] Periwound Skin Moisture: [6:No Abnormalities Noted] [N/A:N/A] Periwound Skin Color: [6:No Abnormalities Noted] [N/A:N/A] Temperature: [6:No Abnormality] [N/A:N/A] Tenderness on Palpation: [6:Yes] [N/A:N/A] Wound Preparation: [6:Ulcer Cleansing: Rinsed/Irrigated with Saline] [N/A:N/A] Topical Anesthetic Applied: Other: lidocaine 4% Treatment Notes Electronic Signature(s) Macdonald, [name] ([id number]) Signed: 01/24/2017 11:13:11 AM By: Alric Quan Entered By: Alric Quan on 01/24/2017 11:13:11 [name] ([id number]) -------------------------------------------------------------------------------- Multi-Disciplinary Care Plan Details Patient Name: [name] Date of Service: 01/24/2017 10:30 AM Medical Record Number: [id number] Patient Account Number: [id number] Date of Birth/Sex: 1970/03/05 (46 y.o. Male) Treating RN: Ahmed Prima Primary Care Thursa Emme: [name] Other Clinician: Referring Jru Pense: [name] Treating Kaitelyn Jamison/Extender: Melburn Hake, [name] Weeks in Treatment: 0 Active Inactive ` Abuse / Safety / Falls / Self Care Management Nursing Diagnoses: History of Falls Potential for falls Goals: Patient will not experience any injury related to falls Date Initiated: 01/24/2017 Target Resolution Date: 04/30/2017 Goal Status: Active Interventions: Assess Activities of Daily Living upon admission and as needed Assess fall risk on admission and as needed Assess: immobility, friction, shearing, incontinence upon admission and as needed Assess impairment of mobility on admission and as needed per policy Assess personal safety and home safety (as indicated) on admission  and as needed Notes: ` Nutrition Nursing Diagnoses: Imbalanced nutrition Impaired glucose control: actual or potential Potential for alteratiion in Nutrition/Potential for imbalanced nutrition Goals: Patient/caregiver agrees to and verbalizes understanding of need to use nutritional supplements and/or vitamins as prescribed Date Initiated: 01/24/2017 Target Resolution Date: 05/28/2017 Goal Status: Active Patient/caregiver will maintain therapeutic glucose control Date Initiated: 01/24/2017 Target Resolution Date: 04/30/2017 Goal Status: Active Interventions: Assess patient nutrition upon admission and as needed per policy Provide education on elevated blood sugars and impact on wound healing NotesRIPKEN, [name] ([id number]) Orientation to the Wound Care Program Nursing Diagnoses: Knowledge deficit related to the wound healing center program Goals: Patient/caregiver will verbalize understanding of the [place] Date Initiated: 01/24/2017 Target Resolution Date: 02/26/2017 Goal Status: Active Interventions: Provide education on orientation to the wound center Notes: ` Pain, Acute or Chronic Nursing Diagnoses: Pain, acute or chronic: actual or potential Potential alteration in comfort, pain Goals: Patient/caregiver will verbalize adequate pain control between visits Date Initiated: 01/24/2017 Target Resolution Date: 05/28/2017 Goal Status: Active Interventions: Complete pain assessment as per visit requirements Notes: ` Wound/Skin Impairment Nursing Diagnoses: Impaired tissue integrity Knowledge deficit related to ulceration/compromised skin integrity Goals: Ulcer/skin breakdown will have a volume reduction of 80% by week 12 Date Initiated: 01/24/2017 Target Resolution Date: 05/28/2017 Goal Status: Active Interventions: Assess patient/caregiver ability to perform ulcer/skin care regimen upon admission and as needed Assess ulceration(s) every  visit Notes:  Electronic Signature(s) Signed: 01/24/2017 11:13:00 AM By: Trudi Ida, Williamson ([id number]) Entered By: Alric Quan on 01/24/2017 11:12:59 [name] ([id number]) -------------------------------------------------------------------------------- Pain Assessment Details Patient Name: [name] Date of Service: 01/24/2017 10:30 AM Medical Record Number: [id number] Patient Account Number: [id number] Date of Birth/Sex: [birth year] (46 y.o. Male) Treating RN: Ahmed Prima Primary Care Evelyna Folker: [name] Other Clinician: Referring Gwenith Tschida: [name] Treating Mrk Buzby/Extender: [name], [name] Weeks in Treatment: 0 Active Problems Location of Pain Severity and Description of Pain Patient Has Paino Yes Site Locations Pain Location: Pain in Ulcers Rate the pain. Current Pain Level: 9 Character of Pain Describe the Pain: Aching, Tender, Throbbing Pain Management and Medication Current Pain Management: Electronic Signature(s) Signed: 01/24/2017 4:03:37 PM By: Alric Quan Entered By: Alric Quan on 01/24/2017 10:38:32 [name] ([id number]) -------------------------------------------------------------------------------- Patient/Caregiver Education Details Patient Name: [name] Date of Service: 01/24/2017 10:30 AM Medical Record Number: [id number] Patient Account Number: [id number] Date of Birth/Gender: 01/05/1971 (46 y.o. Male) Treating RN: Ahmed Prima Primary Care Physician: [name] Other Clinician: Referring Physician: [name] Treating Physician/Extender: Melburn Hake, [name] Weeks in Treatment: 0 Education Assessment Education Provided To: Patient Education Topics Provided Elevated Blood Sugar/ Impact on Healing: Handouts: Elevated Blood Sugars: How Do They Affect Wound Healing Methods: Explain/Verbal Responses: State content correctly Medication  Safety: Nutrition: Handouts: Elevated Blood Sugars: How Do They Affect Wound Healing, Nutrition Methods: Explain/Verbal Responses: State content correctly Welcome To [facility]: Handouts: Welcome To [facility] Methods: Explain/Verbal Responses: State content correctly Wound/Skin Impairment: Handouts: Caring for Your Ulcer, Other: change dressing as ordered Methods: Demonstration, Explain/Verbal Responses: State content correctly Electronic Signature(s) Signed: 01/24/2017 4:03:37 PM By: Alric Quan Entered By: Alric Quan on 01/24/2017 11:14:17 [name] ([id number]) -------------------------------------------------------------------------------- Wound Assessment Details Patient Name: [name] Date of Service: 01/24/2017 10:30 AM Medical Record Number: [id number] Patient Account Number: [id number] Date of Birth/Sex: 10-12-1970 (46 y.o. Male) Treating RN: Ahmed Prima Primary Care Kou Gucciardo: [name] Other Clinician: Referring Ethal Gotay: [name] Treating Jawaun Celmer/Extender: [name], [name] Weeks in Treatment: 0 Wound Status Wound Number: 6 Primary Etiology: Diabetic Wound/Ulcer of the Lower Extremity Wound Location: Right Lower Leg - Anterior Wound Status: Open Wounding Event: Gradually Appeared Comorbid Hypertension, Type II Diabetes Date Acquired: 01/10/2017 History: Weeks Of Treatment: 0 Clustered Wound: No Photos Photo Uploaded By: Alric Quan on 01/24/2017 16:01:15 Wound Measurements Length: (cm) 1 Width: (cm) 1.5 Depth: (cm) 0.2 Area: (cm) 1.178 Volume: (cm) 0.236 % Reduction in Area: 0% % Reduction in Volume: 0% Epithelialization: None Tunneling: Yes Position (o'clock): 9 Maximum Distance: (cm) 1.1 Undermining: No Wound Description Classification: Grade 2 Wound Margin: Distinct, outline attached Exudate Amount: Large Exudate Type: Serous Exudate Color: amber Foul Odor After Cleansing:  No Slough/Fibrino Yes Wound Bed Granulation Amount: Medium (34-66%) Granulation Quality: Red Necrotic Amount: Medium (34-66%) Necrotic Quality: Adherent Slough Periwound Skin Texture [name], [name] ([id number]) Texture Color No Abnormalities Noted: No No Abnormalities Noted: No Moisture Temperature / Pain No Abnormalities Noted: No Temperature: No Abnormality Tenderness on Palpation: Yes Wound Preparation Ulcer Cleansing: Rinsed/Irrigated with Saline Topical Anesthetic Applied: Other: lidocaine 4%, Treatment Notes Wound #6 (Right, Anterior Lower Leg) 1. Cleansed with: Clean wound with Normal Saline 2. Anesthetic Topical Lidocaine 4% cream to wound bed prior to debridement 5. Secondary [place] Notes silvercel rope Electronic Signature(s) Signed: 01/24/2017 4:03:37 PM By: Alric Quan Entered By: Alric Quan on 01/24/2017 11:31:52 [name] ([id number]) -------------------------------------------------------------------------------- Vitals Details Patient Name: [name] Date of Service: 01/24/2017  10:30 AM Medical Record Number: [id number] Patient Account Number: [id number] Date of Birth/Sex: [birth year] (46 y.o. Male) Treating RN: Carolyne Fiscal, [name] Primary Care Trinty Marken: [name] Other Clinician: Referring Yunuen Mordan: [name] Treating Janes Colegrove/Extender: [name], [name] Weeks in Treatment: 0 Vital Signs Time Taken: 10:38 Temperature (F): 98.0 Height (in): 75 Pulse (bpm): 82 Source: Stated Respiratory Rate (breaths/min): 18 Weight (lbs): 240.8 Blood Pressure (mmHg): 163/92 Source: Measured Reference Range: 80 - 120 mg / dl Body Mass Index (BMI): 30.1 Notes Made [name] aware of BP. Pt is having a high level of pain. Electronic Signature(s) Signed: 01/24/2017 4:03:37 PM By: Alric Quan Entered By: Alric Quan on 01/24/2017 10:42:28

## 2017-01-27 LAB — AEROBIC CULTURE W GRAM STAIN (SUPERFICIAL SPECIMEN)

## 2017-01-27 LAB — AEROBIC CULTURE  (SUPERFICIAL SPECIMEN)

## 2017-01-31 ENCOUNTER — Ambulatory Visit: Payer: BC Managed Care – PPO | Admitting: Surgery

## 2017-01-31 ENCOUNTER — Ambulatory Visit: Payer: BC Managed Care – PPO | Admitting: Podiatry

## 2017-02-03 ENCOUNTER — Ambulatory Visit: Payer: BC Managed Care – PPO | Admitting: Podiatry

## 2017-02-04 ENCOUNTER — Encounter: Payer: BC Managed Care – PPO | Admitting: Surgery

## 2017-02-04 DIAGNOSIS — E11622 Type 2 diabetes mellitus with other skin ulcer: Secondary | ICD-10-CM | POA: Diagnosis not present

## 2017-02-05 NOTE — Progress Notes (Addendum)
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 Medical History: Negative for: Cataracts; Glaucoma; Optic Neuritis Ear/Nose/Mouth/Throat Medical History: Negative for: Chronic sinus problems/congestion; Middle ear problems Hematologic/Lymphatic Medical History: Negative for: Anemia; Hemophilia; Human Immunodeficiency Virus; Lymphedema; Sickle Cell Disease Respiratory Medical History: Negative for: Aspiration; Asthma; Chronic Obstructive Pulmonary Disease (COPD); Pneumothorax; Sleep Apnea; Tuberculosis Cardiovascular Medical History: Positive for: Hypertension Negative for: Angina; Arrhythmia; Congestive Heart Failure; Coronary Artery Disease; Deep Vein Thrombosis; Hypotension; Myocardial Infarction; Peripheral Arterial Disease; Peripheral Venous Disease; Phlebitis; Vasculitis Gastrointestinal Medical History: Negative for: Cirrhosis ; Colitis; Crohnos; Hepatitis A; Hepatitis B; Hepatitis C Endocrine [name], [name] ([id number]) Medical History: Positive for: Type II Diabetes Negative for: Type I Diabetes Time with diabetes: 7 years Treated with: Insulin Blood sugar tested every day: Yes Tested : before eat Genitourinary Medical  History: Negative for: End Stage Renal Disease Immunological Medical History: Negative for: Lupus Erythematosus; Raynaudos; Scleroderma Integumentary (Skin) Medical History: Negative for: History of Burn; History of pressure wounds Musculoskeletal Medical History: Negative for: Gout; Rheumatoid Arthritis; Osteoarthritis; Osteomyelitis Neurologic Medical History: Negative for: Dementia; Neuropathy; Quadriplegia; Paraplegia; Seizure Disorder Oncologic Medical History: Negative for: Received Chemotherapy; Received Radiation Psychiatric Medical History: Negative for: Anorexia/bulimia; Confinement Anxiety Immunizations Pneumococcal Vaccine: Received Pneumococcal Vaccination: No Implantable Devices Hospitalization / Surgery History Name of Hospital Purpose of Hospitalization/Surgery Date [facility] cellulitis on face 06/23/2015 Family and Social History Cancer: Yes - Paternal Grandparents; Diabetes: Yes - Paternal Grandparents,Maternal Grandparents,Mother; Heart Disease: Yes - Father; Hypertension: Yes - Mother,Father; Kidney Disease: Yes - Mother; Lung Disease: No; Seizures: No; Stroke: Yes - Father; Thyroid Problems: No; Tuberculosis: No; Never smoker; Marital Status - Married; Alcohol Use: Never; Drug Use: No History; Caffeine Use: Moderate; Advanced Directives: No; Patient does not want information on Advanced Directives; Do not resuscitate: No; Living Will: No; Medical Power of AttorneyKHAIDYN, [name] ([id number]) Physician Affirmation I have reviewed and agree with the above information. Electronic Signature(s) Signed: 02/04/2017 4:46:19 PM By: [name] Signed: 02/04/2017 4:49:12 PM By: [name] Fudge MD, FACS Entered By: [name] on 02/04/2017 15:15:41 [name] ([id number]) -------------------------------------------------------------------------------- SuperBill Details Patient Name: [name] Date of Service: 02/04/2017 Medical Record Number:  [id number] Patient Account Number: [id number] Date of Birth/Sex: 08/09/1970 (46 y.o. 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 I10 Essential (primary) hypertension Facility Procedures CPT4 Code: [id number] Description: [phone] - WOUND CARE VISIT-LEV 2 EST PT Modifier: Quantity: 1 Physician Procedures CPT4 Code Description: [id number] - WC PHYS LEVEL 3 - EST PT ICD-10 Diagnosis Description 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

## 2017-02-06 NOTE — Progress Notes (Signed)
JEANCARLO, LEFFLER ([id number]) Visit Report for 02/04/2017 Arrival Information Details Patient Name: [name], [name] Date of Service: 02/04/2017 2:45 PM Medical Record Number: [id number] Patient Account Number: [id number] Date of Birth/Sex: Jul 10, 1970 (46 y.o. Male) Treating RN: Roger Shelter Primary Care Burr Soffer: [name] Other Clinician: Referring Sarahy Creedon: [name] Treating Marvelene Stoneberg/Extender: Frann Rider in Treatment: 1 Visit Information History Since Last Visit All ordered tests and consults were completed: No Patient Arrived: Ambulatory Added or deleted any medications: No Arrival Time: 14:37 Any new allergies or adverse reactions: No Accompanied By: self Had a fall or experienced change in No Transfer Assistance: None activities of daily living that may affect Patient Identification Verified: Yes risk of falls: Secondary Verification Process Yes Signs or symptoms of abuse/neglect since last visito No Completed: Hospitalized since last visit: No Patient Requires Transmission-Based No Pain Present Now: Yes Precautions: Patient Has Alerts: Yes Patient Alerts: DM II R ABI 1.23 from EPIC L ABI 1.21 from [facility] Electronic Signature(s) Signed: 02/04/2017 4:46:19 PM By: Roger Shelter Entered By: Roger Shelter on 02/04/2017 14:38:51 [name] ([id number]) -------------------------------------------------------------------------------- Clinic Level of Care Assessment Details Patient Name: [name] Date of Service: 02/04/2017 2:45 PM Medical Record Number: [id number] Patient Account Number: [id number] Date of Birth/Sex: October 01, 1970 (46 y.o. Male) Treating RN: Roger Shelter Primary Care Gleason Ardoin: [name] Other Clinician: Referring Thoren Hosang: [name] Treating Mayco Walrond/Extender: Frann Rider in Treatment: 1 Clinic Level of Care Assessment Items TOOL 4 Quantity Score []  - Use when only an EandM is performed on  FOLLOW-UP visit 0 ASSESSMENTS - Nursing Assessment / Reassessment []  - Reassessment of Co-morbidities (includes updates in patient status) 0 X- 1 5 Reassessment of Adherence to Treatment Plan ASSESSMENTS - Wound and Skin Assessment / Reassessment X - Simple Wound Assessment / Reassessment - one wound 1 5 []  - 0 Complex Wound Assessment / Reassessment - multiple wounds []  - 0 Dermatologic / Skin Assessment (not related to wound area) ASSESSMENTS - Focused Assessment []  - Circumferential Edema Measurements - multi extremities 0 []  - 0 Nutritional Assessment / Counseling / Intervention []  - 0 Lower Extremity Assessment (monofilament, tuning fork, pulses) []  - 0 Peripheral Arterial Disease Assessment (using hand held doppler) ASSESSMENTS - Ostomy and/or Continence Assessment and Care []  - Incontinence Assessment and Management 0 []  - 0 Ostomy Care Assessment and Management (repouching, etc.) PROCESS - Coordination of Care X - Simple Patient / Family Education for ongoing care 1 15 []  - 0 Complex (extensive) Patient / Family Education for ongoing care []  - 0 Staff obtains Programmer, systems, Records, Test Results / Process Orders []  - 0 Staff telephones HHA, Nursing Homes / Clarify orders / etc []  - 0 Routine Transfer to another Facility (non-emergent condition) []  - 0 Routine Hospital Admission (non-emergent condition) []  - 0 New Admissions / Biomedical engineer / Ordering NPWT, Apligraf, etc. []  - 0 Emergency Hospital Admission (emergent condition) X- 1 10 Simple Discharge Coordination Starling, [place] ([id number]) []  - 0 Complex (extensive) Discharge Coordination PROCESS - Special Needs []  - Pediatric / Minor Patient Management 0 []  - 0 Isolation Patient Management []  - 0 Hearing / Language / Visual special needs []  - 0 Assessment of Community assistance (transportation, D/C planning, etc.) []  - 0 Additional assistance / Altered mentation []  - 0 Support Surface(s)  Assessment (bed, cushion, seat, etc.) INTERVENTIONS - Wound Cleansing / Measurement X - Simple Wound Cleansing - one wound 1 5 []  - 0 Complex Wound Cleansing - multiple wounds X- 1 5 Wound Imaging (photographs - any  number of wounds) []  - 0 Wound Tracing (instead of photographs) X- 1 5 Simple Wound Measurement - one wound []  - 0 Complex Wound Measurement - multiple wounds INTERVENTIONS - Wound Dressings X - Small Wound Dressing one or multiple wounds 1 10 []  - 0 Medium Wound Dressing one or multiple wounds []  - 0 Large Wound Dressing one or multiple wounds []  - 0 Application of Medications - topical []  - 0 Application of Medications - injection INTERVENTIONS - Miscellaneous []  - External ear exam 0 []  - 0 Specimen Collection (cultures, biopsies, blood, body fluids, etc.) []  - 0 Specimen(s) / Culture(s) sent or taken to Lab for analysis []  - 0 Patient Transfer (multiple staff / Civil Service fast streamer / Similar devices) []  - 0 Simple Staple / Suture removal (25 or less) []  - 0 Complex Staple / Suture removal (26 or more) []  - 0 Hypo / Hyperglycemic Management (close monitor of Blood Glucose) []  - 0 Ankle / Brachial Index (ABI) - do not check if billed separately X- 1 5 Vital Signs Conklin, Dyllin ([id number]) Has the patient been seen at the hospital within the last three years: Yes Total Score: 65 Level Of Care: New/Established - Level 2 Electronic Signature(s) Signed: 02/04/2017 4:46:19 PM By: Roger Shelter Entered By: Roger Shelter on 02/04/2017 15:14:37 [name] ([id number]) -------------------------------------------------------------------------------- Encounter Discharge Information Details Patient Name: [name] Date of Service: 02/04/2017 2:45 PM Medical Record Number: [id number] Patient Account Number: [id number] Date of Birth/Sex: 27-Apr-1970 (46 y.o. Male) Treating RN: Roger Shelter Primary Care Shanieka Blea: [name] Other  Clinician: Referring Svara Twyman: [name] Treating Aviona Martenson/Extender: Frann Rider in Treatment: 1 Encounter Discharge Information Items Discharge Pain Level: 0 Discharge Condition: Stable Ambulatory Status: Ambulatory Discharge Destination: Home Transportation: Other Accompanied By: driver Schedule Follow-up Appointment: Yes Medication Reconciliation completed and No provided to Patient/Care Bama Hanselman: Patient Clinical Summary of Care: Declined Electronic Signature(s) Signed: 02/04/2017 4:46:19 PM By: Roger Shelter Entered By: Roger Shelter on 02/04/2017 15:15:56 [name] ([id number]) -------------------------------------------------------------------------------- Lower Extremity Assessment Details Patient Name: [name] Date of Service: 02/04/2017 2:45 PM Medical Record Number: [id number] Patient Account Number: [id number] Date of Birth/Sex: 02-24-1970 (46 y.o. Male) Treating RN: Roger Shelter Primary Care Jerelyn Trimarco: [name] Other Clinician: Referring Maryjean Corpening: [name] Treating Sorah Falkenstein/Extender: Frann Rider in Treatment: 1 Edema Assessment Assessed: [Left: No] [Right: No] Edema: [Left: Ye] [Right: s] Calf Left: Right: Point of Measurement: 39 cm From Medial Instep cm 41.6 cm Ankle Left: Right: Point of Measurement: 11 cm From Medial Instep cm 27.2 cm Vascular Assessment Claudication: Claudication Assessment [Right:Rest Pain] Pulses: Dorsalis Pedis Palpable: [Right:Yes] Posterior Tibial Extremity colors, hair growth, and conditions: Extremity Color: [Right:Normal] Hair Growth on Extremity: [Right:Yes] Temperature of Extremity: [Right:Warm] Toe Nail Assessment Left: Right: Thick: Yes Discolored: Yes Deformed: No Improper Length and Hygiene: No Electronic Signature(s) Signed: 02/04/2017 4:46:19 PM By: Roger Shelter Entered By: Roger Shelter on 02/04/2017 14:47:43 [name]  ([id number]) -------------------------------------------------------------------------------- Multi Wound Chart Details Patient Name: [name] Date of Service: 02/04/2017 2:45 PM Medical Record Number: [id number] Patient Account Number: [id number] Date of Birth/Sex: [birth year] (46 y.o. Male) Treating RN: Roger Shelter Primary Care Dao Memmott: [name] Other Clinician: Referring Asna Muldrow: [name] Treating Mera Gunkel/Extender: Frann Rider in Treatment: 1 Vital Signs Height(in): 75 Pulse(bpm): 85 Weight(lbs): 240.8 Blood Pressure(mmHg): 97/76 Body Mass Index(BMI): 30 Temperature(F): 97.7 Respiratory Rate 18 (breaths/min): Photos: [6:No Photos] [N/A:N/A] Wound Location: [6:Right Lower Leg - Anterior] [N/A:N/A] Wounding Event: [6:Gradually Appeared] [N/A:N/A] Primary Etiology: [6:Diabetic Wound/Ulcer of the N/A Lower  Extremity] Comorbid History: [6:Hypertension, Type II Diabetes N/A] Date Acquired: [6:01/10/2017] [N/A:N/A] Weeks of Treatment: [6:1] [N/A:N/A] Wound Status: [6:Open] [N/A:N/A] Measurements L x W x D [6:0.9x1x0.2] [N/A:N/A] (cm) Area (cm) : [9:7.353] [N/A:N/A] Volume (cm) : [6:0.141] [N/A:N/A] % Reduction in Area: [6:40.00%] [N/A:N/A] % Reduction in Volume: [6:40.30%] [N/A:N/A] Classification: [6:Grade 2] [N/A:N/A] Exudate Amount: [6:Large] [N/A:N/A] Exudate Type: [6:Serous] [N/A:N/A] Exudate Color: [6:amber] [N/A:N/A] Wound Margin: [6:Distinct, outline attached] [N/A:N/A] Granulation Amount: [6:Small (1-33%)] [N/A:N/A] Granulation Quality: [6:Red] [N/A:N/A] Necrotic Amount: [6:Large (67-100%)] [N/A:N/A] Necrotic Tissue: [6:Eschar, Adherent Slough] [N/A:N/A] Exposed Structures: [6:Fat Layer (Subcutaneous Tissue) Exposed: Yes Fascia: No Tendon: No Muscle: No Joint: No Bone: No] [N/A:N/A] Epithelialization: [6:Large (67-100%)] [N/A:N/A] Periwound Skin Texture: [6:No Abnormalities Noted] [N/A:N/A] Periwound Skin  Moisture: [6:Dry/Scaly: Yes Maceration: No] [N/A:N/A] Periwound Skin Color: [6:No Abnormalities Noted] [N/A:N/A] Temperature: [6:No Abnormality] [N/A:N/A] Tenderness on Palpation: Yes N/A N/A Wound Preparation: Ulcer Cleansing: N/A N/A Rinsed/Irrigated with Saline Topical Anesthetic Applied: Other: lidocaine 4% Treatment Notes Electronic Signature(s) Signed: 02/04/2017 3:13:08 PM By: Christin Fudge MD, FACS Entered By: Christin Fudge on 02/04/2017 15:13:08 [name] ([id number]) -------------------------------------------------------------------------------- Multi-Disciplinary Care Plan Details Patient Name: [name] Date of Service: 02/04/2017 2:45 PM Medical Record Number: [id number] Patient Account Number: [id number] Date of Birth/Sex: 11-May-1970 (46 y.o. Male) Treating RN: Roger Shelter Primary Care Takenya Travaglini: [name] Other Clinician: Referring Emillee Talsma: [name] Treating Eimy Plaza/Extender: Frann Rider in Treatment: 1 Active Inactive ` Abuse / Safety / Falls / Self Care Management Nursing Diagnoses: History of Falls Potential for falls Goals: Patient will not experience any injury related to falls Date Initiated: 01/24/2017 Target Resolution Date: 04/30/2017 Goal Status: Active Interventions: Assess Activities of Daily Living upon admission and as needed Assess fall risk on admission and as needed Assess: immobility, friction, shearing, incontinence upon admission and as needed Assess impairment of mobility on admission and as needed per policy Assess personal safety and home safety (as indicated) on admission and as needed Notes: ` Nutrition Nursing Diagnoses: Imbalanced nutrition Impaired glucose control: actual or potential Potential for alteratiion in Nutrition/Potential for imbalanced nutrition Goals: Patient/caregiver agrees to and verbalizes understanding of need to use nutritional supplements and/or vitamins as  prescribed Date Initiated: 01/24/2017 Target Resolution Date: 05/28/2017 Goal Status: Active Patient/caregiver will maintain therapeutic glucose control Date Initiated: 01/24/2017 Target Resolution Date: 04/30/2017 Goal Status: Active Interventions: Assess patient nutrition upon admission and as needed per policy Provide education on elevated blood sugars and impact on wound healing NotesLAYKEN, DOENGES ([id number]) Orientation to the Wound Care Program Nursing Diagnoses: Knowledge deficit related to the wound healing center program Goals: Patient/caregiver will verbalize understanding of the [place] Program Date Initiated: 01/24/2017 Target Resolution Date: 02/26/2017 Goal Status: Active Interventions: Provide education on orientation to the wound center Notes: ` Pain, Acute or Chronic Nursing Diagnoses: Pain, acute or chronic: actual or potential Potential alteration in comfort, pain Goals: Patient/caregiver will verbalize adequate pain control between visits Date Initiated: 01/24/2017 Target Resolution Date: 05/28/2017 Goal Status: Active Interventions: Complete pain assessment as per visit requirements Notes: ` Wound/Skin Impairment Nursing Diagnoses: Impaired tissue integrity Knowledge deficit related to ulceration/compromised skin integrity Goals: Ulcer/skin breakdown will have a volume reduction of 80% by week 12 Date Initiated: 01/24/2017 Target Resolution Date: 05/28/2017 Goal Status: Active Interventions: Assess patient/caregiver ability to perform ulcer/skin care regimen upon admission and as needed Assess ulceration(s) every visit Notes: Electronic Signature(s) Signed: 02/04/2017 4:46:19 PM By: Michaelle Copas, Stokes ([id number]) Entered By: Roger Shelter on 02/04/2017 15:01:16 [name] ([id number]) --------------------------------------------------------------------------------  Pain Assessment Details Patient Name:  SMITTY, ACKERLEY Date of Service: 02/04/2017 2:45 PM Medical Record Number: [id number] Patient Account Number: [id number] Date of Birth/Sex: 1971/02/17 (46 y.o. Male) Treating RN: Roger Shelter Primary Care Laurann Mcmorris: [name] Other Clinician: Referring Guled Gahan: [name] Treating Sherard Sutch/Extender: Frann Rider in Treatment: 1 Active Problems Location of Pain Severity and Description of Pain Patient Has Paino Yes Site Locations Pain Location: Pain in Ulcers Duration of the Pain. Constant / Intermittento Constant Rate the pain. Current Pain Level: 8 Character of Pain Describe the Pain: Aching, Burning Pain Management and Medication Current Pain Management: Electronic Signature(s) Signed: 02/04/2017 4:46:19 PM By: Roger Shelter Entered By: Roger Shelter on 02/04/2017 14:39:14 [name] ([id number]) -------------------------------------------------------------------------------- Patient/Caregiver Education Details Patient Name: [name] Date of Service: 02/04/2017 2:45 PM Medical Record Number: [id number] Patient Account Number: [id number] Date of Birth/Gender: 1970-07-01 (46 y.o. Male) Treating RN: Roger Shelter Primary Care Physician: [name] Other Clinician: Referring Physician: [name] Treating Physician/Extender: Frann Rider in Treatment: 1 Education Assessment Education Provided To: Patient Education Topics Provided Wound/Skin Impairment: Handouts: Caring for Your Ulcer Methods: Explain/Verbal Responses: State content correctly Electronic Signature(s) Signed: 02/04/2017 4:46:19 PM By: Roger Shelter Entered By: Roger Shelter on 02/04/2017 15:18:04 [name] ([id number]) -------------------------------------------------------------------------------- Wound Assessment Details Patient Name: [name] Date of Service: 02/04/2017 2:45 PM Medical Record Number: [id number] Patient  Account Number: [id number] Date of Birth/Sex: 1970/06/05 (46 y.o. Male) Treating RN: Roger Shelter Primary Care Jorey Dollard: [name] Other Clinician: Referring Keilynn Marano: [name] Treating Quintyn Dombek/Extender: Frann Rider in Treatment: 1 Wound Status Wound Number: 6 Primary Etiology: Diabetic Wound/Ulcer of the Lower Extremity Wound Location: Right Lower Leg - Anterior Wound Status: Open Wounding Event: Gradually Appeared Comorbid Hypertension, Type II Diabetes Date Acquired: 01/10/2017 History: Weeks Of Treatment: 1 Clustered Wound: No Photos Photo Uploaded By: Roger Shelter on 02/04/2017 16:51:31 Wound Measurements Length: (cm) 0.9 Width: (cm) 1 Depth: (cm) 0.2 Area: (cm) 0.707 Volume: (cm) 0.141 % Reduction in Area: 40% % Reduction in Volume: 40.3% Epithelialization: Large (67-100%) Tunneling: No Undermining: No Wound Description Classification: Grade 2 Wound Margin: Distinct, outline attached Exudate Amount: Large Exudate Type: Serous Exudate Color: amber Foul Odor After Cleansing: No Slough/Fibrino Yes Wound Bed Granulation Amount: Small (1-33%) Exposed Structure Granulation Quality: Red Fascia Exposed: No Necrotic Amount: Large (67-100%) Fat Layer (Subcutaneous Tissue) Exposed: Yes Necrotic Quality: Eschar, Adherent Slough Tendon Exposed: No Muscle Exposed: No Joint Exposed: No Bone Exposed: No Periwound Skin Texture Buffone, Eusebio ([id number]) Texture Color No Abnormalities Noted: No No Abnormalities Noted: No Moisture Temperature / Pain No Abnormalities Noted: No Temperature: No Abnormality Dry / Scaly: Yes Tenderness on Palpation: Yes Maceration: No Wound Preparation Ulcer Cleansing: Rinsed/Irrigated with Saline Topical Anesthetic Applied: Other: lidocaine 4%, Treatment Notes Wound #6 (Right, Anterior Lower Leg) 1. Cleansed with: Clean wound with Normal Saline 2. Anesthetic Topical Lidocaine 4% cream to wound bed  prior to debridement 4. Dressing Applied: Other dressing (specify in notes) 5. Secondary [place] Notes silvercel Electronic Signature(s) Signed: 02/04/2017 4:46:19 PM By: Roger Shelter Entered By: Roger Shelter on 02/04/2017 14:45:43 [name] ([id number]) -------------------------------------------------------------------------------- Vitals Details Patient Name: [name] Date of Service: 02/04/2017 2:45 PM Medical Record Number: [id number] Patient Account Number: [id number] Date of Birth/Sex: 25-May-1970 (46 y.o. Male) Treating RN: Roger Shelter Primary Care Ramsey Midgett: [name] Other Clinician: Referring Dari Carpenito: [name] Treating Mionna Advincula/Extender: Frann Rider in Treatment: 1 Vital Signs Time Taken: 02:39 Temperature (F): 97.7 Height (in): 75 Pulse (bpm): 85  Weight (lbs): 240.8 Respiratory Rate (breaths/min): 18 Body Mass Index (BMI): 30.1 Blood Pressure (mmHg): 97/76 Reference Range: 80 - 120 mg / dl Electronic Signature(s) Signed: 02/04/2017 4:46:19 PM By: Roger Shelter Entered By: Roger Shelter on 02/04/2017 14:41:18

## 2017-02-07 ENCOUNTER — Ambulatory Visit: Payer: BC Managed Care – PPO | Admitting: Surgery

## 2017-02-10 ENCOUNTER — Encounter: Payer: BC Managed Care – PPO | Admitting: Nurse Practitioner

## 2017-02-10 DIAGNOSIS — E11622 Type 2 diabetes mellitus with other skin ulcer: Secondary | ICD-10-CM | POA: Diagnosis not present

## 2017-02-11 ENCOUNTER — Ambulatory Visit: Payer: BC Managed Care – PPO | Admitting: Physician Assistant

## 2017-02-12 NOTE — Progress Notes (Signed)
[name], [name] ([id number]) Visit Report for 02/10/2017 Arrival Information Details Patient Name: [name], [name] Date of Service: 02/10/2017 8:45 AM Medical Record Number: [id number] Patient Account Number: [id number] Date of Birth/Sex: 11-22-1970 (46 y.o. Male) Treating RN: Roger Shelter Primary Care Lorelai Huyser: [name] Other Clinician: Referring Altin Sease: [name] Treating Faria Casella/Extender: Cathie Olden in Treatment: 2 Visit Information History Since Last Visit All ordered tests and consults were completed: No Patient Arrived: Ambulatory Added or deleted any medications: No Arrival Time: 08:45 Any new allergies or adverse reactions: No Accompanied By: self Had a fall or experienced change in No Transfer Assistance: None activities of daily living that may affect Patient Identification Verified: Yes risk of falls: Secondary Verification Process Yes Signs or symptoms of abuse/neglect since last visito No Completed: Hospitalized since last visit: No Patient Requires Transmission-Based No Pain Present Now: No Precautions: Patient Has Alerts: Yes Patient Alerts: DM II R ABI 1.23 from EPIC L ABI 1.21 from [facility] Electronic Signature(s) Signed: 02/10/2017 4:37:13 PM By: Roger Shelter Entered By: Roger Shelter on 02/10/2017 08:47:08 [name] ([id number]) -------------------------------------------------------------------------------- Encounter Discharge Information Details Patient Name: [name] Date of Service: 02/10/2017 8:45 AM Medical Record Number: [id number] Patient Account Number: [id number] Date of Birth/Sex: Jul 16, 1970 (46 y.o. Male) Treating RN: Roger Shelter Primary Care Tamirra Sienkiewicz: [name] Other Clinician: Referring Cordera Stineman: [name] Treating Jaysiah Marchetta/Extender: Cathie Olden in Treatment: 2 Encounter Discharge Information Items Discharge Pain Level: 0 Discharge Condition: Stable Ambulatory  Status: Ambulatory Discharge Destination: Home Transportation: Private Auto Accompanied By: Carlynn Purl Schedule Follow-up Appointment: Yes Medication Reconciliation completed and No provided to Patient/Care Littie Chiem: Patient Clinical Summary of Care: Declined Electronic Signature(s) Signed: 02/10/2017 4:37:13 PM By: Roger Shelter Entered By: Roger Shelter on 02/10/2017 09:28:11 [name] ([id number]) -------------------------------------------------------------------------------- Lower Extremity Assessment Details Patient Name: [name] Date of Service: 02/10/2017 8:45 AM Medical Record Number: [id number] Patient Account Number: [id number] Date of Birth/Sex: 1970/10/25 (46 y.o. Male) Treating RN: Roger Shelter Primary Care Sumiye Hirth: [name] Other Clinician: Referring Verlie Liotta: [name] Treating Margaretta Chittum/Extender: Cathie Olden in Treatment: 2 Edema Assessment Assessed: [Left: No] [Right: No] Edema: [Left: N] [Right: o] Calf Left: Right: Point of Measurement: 39 cm From Medial Instep cm 40 cm Ankle Left: Right: Point of Measurement: 11 cm From Medial Instep cm 26.8 cm Vascular Assessment Claudication: Claudication Assessment [Right:None] Pulses: Dorsalis Pedis Palpable: [Right:Yes] Posterior Tibial Extremity colors, hair growth, and conditions: Extremity Color: [Right:Normal] Hair Growth on Extremity: [Right:Yes] Temperature of Extremity: [Right:Cold] Capillary Refill: [Right:> 3 seconds] Toe Nail Assessment Left: Right: Thick: Yes Discolored: Yes Deformed: No Improper Length and Hygiene: Yes Electronic Signature(s) Signed: 02/10/2017 4:37:13 PM By: Roger Shelter Entered By: Roger Shelter on 02/10/2017 09:04:30 [name] ([id number]) -------------------------------------------------------------------------------- Multi Wound Chart Details Patient Name: [name] Date of Service: 02/10/2017 8:45  AM Medical Record Number: [id number] Patient Account Number: [id number] Date of Birth/Sex: 27-Nov-1970 (46 y.o. 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] Comorbid History: [6:Hypertension, Type II Diabetes N/A] Date Acquired: [6:01/10/2017] [N/A:N/A] Weeks of Treatment: [6:2] [N/A:N/A] Wound Status: [6:Open] [N/A:N/A] Measurements L x W x D [6:0.6x0.7x0.1] [N/A:N/A] (cm) Area (cm) : [6:0.33] [N/A:N/A] Volume (cm) : [  6:0.033] [N/A:N/A] % Reduction in Area: [6:72.00%] [N/A:N/A] % Reduction in Volume: [6:86.00%] [N/A:N/A] Classification: [6:Grade 2] [N/A:N/A] Exudate Amount: [6:Large] [N/A:N/A] Exudate Type: [6:Serous] [N/A:N/A] Exudate Color: [6:amber] [N/A:N/A] Wound Margin: [6:Distinct, outline attached] [N/A:N/A] Granulation Amount: [6:Small (1-33%)] [N/A:N/A] Granulation Quality: [6:Red] [N/A:N/A] Necrotic Amount: [6:Large (67-100%)] [N/A:N/A] Necrotic Tissue: [6:Eschar, Adherent Slough] [N/A:N/A] Exposed Structures: [6:Fat Layer (Subcutaneous Tissue) Exposed: Yes Fascia: No Tendon: No Muscle: No Joint: No Bone: No] [N/A:N/A] Epithelialization: [6:Large (67-100%)] [N/A:N/A] Debridement: [6:Debridement (11042-11047)] [N/A:N/A] Pre-procedure [6:09:05] [N/A:N/A] Verification/Time Out Taken: Pain Control: [6:Other] [N/A:N/A] Tissue Debrided: [N/A:N/A] Necrotic/Eschar, Fibrin/Slough, Skin, Subcutaneous Level: Skin/Subcutaneous Tissue N/A  N/A Debridement Area (sq cm): 0.42 N/A N/A Instrument: Blade N/A N/A Bleeding: Minimum N/A N/A Hemostasis Achieved: Pressure N/A N/A Procedural Pain: 0 N/A N/A Post Procedural Pain: 0 N/A N/A Debridement Treatment Procedure was tolerated well N/A N/A Response: Post Debridement 0.6x0.7x0.1 N/A N/A Measurements L x W x D (cm) Post Debridement Volume: 0.033 N/A N/A (cm) Periwound Skin Texture: Callus: Yes N/A N/A Excoriation: No Induration: No Crepitus: No Rash: No Scarring: No Periwound Skin Moisture: Dry/Scaly: Yes N/A N/A Maceration: No Periwound Skin Color: Atrophie Blanche: No N/A N/A Cyanosis: No Ecchymosis: No Erythema: No Hemosiderin Staining: No Mottled: No Pallor: No Rubor: No Temperature: No Abnormality N/A N/A Tenderness on Palpation: Yes N/A N/A Wound Preparation: Ulcer Cleansing: N/A N/A Rinsed/Irrigated with Saline Topical Anesthetic Applied: Other: lidocaine 4% Procedures Performed: Debridement N/A N/A Treatment Notes Electronic Signature(s) Signed: 02/10/2017 4:55:16 PM By: Lawanda Cousins Entered By: Lawanda Cousins on 02/10/2017 09:22:20 [name] ([id number]) -------------------------------------------------------------------------------- Multi-Disciplinary Care Plan Details Patient Name: [name] Date of Service: 02/10/2017 8:45 AM Medical Record Number: [id number] Patient Account Number: [id number] Date of Birth/Sex: [birth year] (46 y.o. Male) Treating RN: Roger Shelter Primary Care Jezreel Justiniano: [name] Other Clinician: Referring Cordarrel Stiefel: [name] Treating Darric Plante/Extender: Cathie Olden in Treatment: 2 Active Inactive ` Abuse / Safety / Falls / Self Care Management Nursing Diagnoses: History of Falls Potential for falls Goals: Patient will not experience any injury related to falls Date Initiated: 01/24/2017 Target Resolution Date: 04/30/2017 Goal Status: Active Interventions: Assess Activities of  Daily Living upon admission and as needed Assess fall risk on admission and as needed Assess: immobility, friction, shearing, incontinence upon admission and as needed Assess impairment of mobility on admission and as needed per policy Assess personal safety and home safety (as indicated) on admission and as needed Notes: ` Nutrition Nursing Diagnoses: Imbalanced nutrition Impaired glucose control: actual or potential Potential for alteratiion in Nutrition/Potential for imbalanced nutrition Goals: Patient/caregiver agrees to and verbalizes understanding of need to use nutritional supplements and/or vitamins as prescribed Date Initiated: 01/24/2017 Target Resolution Date: 05/28/2017 Goal Status: Active Patient/caregiver will maintain therapeutic glucose control Date Initiated: 01/24/2017 Target Resolution Date: 04/30/2017 Goal Status: Active Interventions: Assess patient nutrition upon admission and as needed per policy Provide education on elevated blood sugars and impact on wound healing NotesEMETERIO, [name] ([id number]) Orientation to the Wound Care Program Nursing Diagnoses: Knowledge deficit related to the wound healing center program Goals: Patient/caregiver will verbalize understanding of the [place] Program Date Initiated: 01/24/2017 Target Resolution Date: 02/26/2017 Goal Status: Active Interventions: Provide education on orientation to the wound center Notes: ` Pain, Acute or Chronic Nursing Diagnoses: Pain, acute or chronic: actual or potential Potential alteration in comfort, pain Goals: Patient/caregiver will verbalize adequate pain control between visits Date Initiated: 01/24/2017 Target Resolution Date: 05/28/2017 Goal Status: Active Interventions: Complete pain assessment as per visit requirements Notes: `  Wound/Skin Impairment Nursing Diagnoses: Impaired tissue integrity Knowledge deficit related to ulceration/compromised skin  integrity Goals: Ulcer/skin breakdown will have a volume reduction of 80% by week 12 Date Initiated: 01/24/2017 Target Resolution Date: 05/28/2017 Goal Status: Active Interventions: Assess patient/caregiver ability to perform ulcer/skin care regimen upon admission and as needed Assess ulceration(s) every visit Notes: Electronic Signature(s) Signed: 02/10/2017 4:37:13 PM By: Michaelle Copas, [name] ([id number]) Entered By: Roger Shelter on 02/10/2017 08:57:00 [name] ([id number]) -------------------------------------------------------------------------------- Pain Assessment Details Patient Name: [name] Date of Service: 02/10/2017 8:45 AM Medical Record Number: [id number] Patient Account Number: [id number] Date of Birth/Sex: Dec 13, 1970 (46 y.o. Male) Treating RN: Roger Shelter Primary Care Dimitriy Carreras: [name] Other Clinician: Referring Alcides Nutting: [name] Treating Michelena Culmer/Extender: Cathie Olden in Treatment: 2 Active Problems Location of Pain Severity and Description of Pain Patient Has Paino No Site Locations Pain Management and Medication Current Pain Management: Electronic Signature(s) Signed: 02/10/2017 4:37:13 PM By: Roger Shelter Entered By: Roger Shelter on 02/10/2017 08:47:17 [name] ([id number]) -------------------------------------------------------------------------------- Patient/Caregiver Education Details Patient Name: [name] Date of Service: 02/10/2017 8:45 AM Medical Record Number: [id number] Patient Account Number: [id number] Date of Birth/Gender: Jun 24, 1970 (46 y.o. Male) Treating RN: Roger Shelter Primary Care Physician: [name] Other Clinician: Referring Physician: [name] Treating Physician/Extender: Cathie Olden in Treatment: 2 Education Assessment Education Provided To: Patient Education Topics Provided Wound Debridement: Handouts: Wound  Debridement Methods: Explain/Verbal Responses: State content correctly Wound/Skin Impairment: Handouts: Caring for Your Ulcer Methods: Explain/Verbal Responses: State content correctly Electronic Signature(s) Signed: 02/10/2017 4:37:13 PM By: Roger Shelter Entered By: Roger Shelter on 02/10/2017 77:82:42 [name] ([id number]) -------------------------------------------------------------------------------- Wound Assessment Details Patient Name: [name] Date of Service: 02/10/2017 8:45 AM Medical Record Number: [id number] Patient Account Number: [id number] Date of Birth/Sex: 07/04/1970 (46 y.o. Male) Treating RN: Roger Shelter Primary Care Kellar Westberg: [name] Other Clinician: Referring Kristoffer Bala: [name] Treating Trystyn Dolley/Extender: Cathie Olden in Treatment: 2 Wound Status Wound Number: 6 Primary Etiology: Diabetic Wound/Ulcer of the Lower Extremity Wound Location: Right Lower Leg - Anterior Wound Status: Open Wounding Event: Gradually Appeared Comorbid Hypertension, Type II Diabetes Date Acquired: 01/10/2017 History: Weeks Of Treatment: 2 Clustered Wound: No Photos Wound Measurements Length: (cm) 0.6 % Reduction Width: (cm) 0.7 % Reduction Depth: (cm) 0.1 Epitheliali Area: (cm) 0.33 Tunneling: Volume: (cm) 0.033 Underminin in Area: 72% in Volume: 86% zation: Large (67-100%) No g: No Wound Description Classification: Grade 2 Foul Odor A Wound Margin: Distinct, outline attached Slough/Fibr Exudate Amount: Large Exudate Type: Serous Exudate Color: amber fter Cleansing: No ino Yes Wound Bed Granulation Amount: Small (1-33%) Exposed Structure Granulation Quality: Red Fascia Exposed: No Necrotic Amount: Large (67-100%) Fat Layer (Subcutaneous Tissue) Exposed: Yes Necrotic Quality: Eschar, Adherent Slough Tendon Exposed: No Muscle Exposed: No Joint Exposed: No Bone Exposed: No Periwound Skin Texture Texture  Color Maeda, [name] ([id number]) No Abnormalities Noted: No No Abnormalities Noted: No Callus: Yes Atrophie Blanche: No Crepitus: No Cyanosis: No Excoriation: No Ecchymosis: No Induration: No Erythema: No Rash: No Hemosiderin Staining: No Scarring: No Mottled: No Pallor: No Moisture Rubor: No No Abnormalities Noted: No Dry / Scaly: Yes Temperature / Pain Maceration: No Temperature: No Abnormality Tenderness on Palpation: Yes Wound Preparation Ulcer Cleansing: Rinsed/Irrigated with Saline Topical Anesthetic Applied: Other: lidocaine 4%, Treatment Notes Wound #6 (Right, Anterior Lower Leg) 1. Cleansed with: Clean wound with Normal Saline 2. Anesthetic Topical Lidocaine 4% cream to wound bed prior to debridement 4. Dressing Applied: Prisma Ag 5. Secondary Dressing Applied Non-Adherent pad 7.  Secured with 3 Layer Compression System - Right Lower Extremity Electronic Signature(s) Signed: 02/10/2017 11:23:59 AM By: Roger Shelter Entered By: Roger Shelter on 02/10/2017 11:23:59 [name] ([id number]) -------------------------------------------------------------------------------- Vitals Details Patient Name: [name] Date of Service: 02/10/2017 8:45 AM Medical Record Number: [id number] Patient Account Number: [id number] Date of Birth/Sex: 09-23-1970 (46 y.o. Male) Treating RN: Roger Shelter Primary Care Zafiro Routson: [name] Other Clinician: Referring Zaineb Nowaczyk: [name] Treating Masato Pettie/Extender: Cathie Olden in Treatment: 2 Vital Signs Time Taken: 08:47 Temperature (F): 98.2 Height (in): 75 Pulse (bpm): 87 Weight (lbs): 240.8 Respiratory Rate (breaths/min): 18 Body Mass Index (BMI): 30.1 Blood Pressure (mmHg): 170/100 Reference Range: 80 - 120 mg / dl Electronic Signature(s) Signed: 02/10/2017 4:37:13 PM By: Roger Shelter Entered By: Roger Shelter on 02/10/2017 08:47:58

## 2017-02-12 NOTE — Progress Notes (Signed)
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, VRE)o Yes Have you had any tests for circulation on your legso Yes Where was the test doneo [facility] Have you had other problems associated with your woundso Swelling Eyes Medical History: Negative for: Cataracts; Glaucoma; Optic Neuritis Ear/Nose/Mouth/Throat Medical History: Negative for: Chronic sinus problems/congestion; Middle ear problems Hematologic/Lymphatic Medical  History: Negative for: Anemia; Hemophilia; Human Immunodeficiency Virus; Lymphedema; Sickle Cell Disease Respiratory Medical History: Negative for: Aspiration; Asthma; Chronic Obstructive Pulmonary Disease (COPD); Pneumothorax; Sleep Apnea; Tuberculosis Cardiovascular Medical History: Positive for: Hypertension Negative for: Angina; Arrhythmia; Congestive Heart Failure; Coronary Artery Disease; Deep Vein Thrombosis; Hypotension; Myocardial Infarction; Peripheral Arterial Disease; Peripheral Venous Disease; Phlebitis; Vasculitis Gastrointestinal Medical History: Negative for: Cirrhosis ; Colitis; Crohnos; Hepatitis A; Hepatitis B; Hepatitis C Endocrine Cregger, Jervis ([id number]) Medical History: Positive for: Type II Diabetes Negative for: Type I Diabetes Time with diabetes: 7 years Treated with: Insulin Blood sugar tested every day: Yes Tested : before eat Genitourinary Medical History: Negative for: End Stage Renal Disease Immunological Medical History: Negative for: Lupus Erythematosus; Raynaudos; Scleroderma Integumentary (Skin) Medical History: Negative for: History of Burn; History of pressure wounds Musculoskeletal Medical History: Negative for: Gout; Rheumatoid Arthritis; Osteoarthritis; Osteomyelitis Neurologic Medical History: Negative for: Dementia; Neuropathy; Quadriplegia; Paraplegia; Seizure Disorder Oncologic Medical History: Negative for: Received Chemotherapy; Received Radiation Psychiatric Medical History: Negative for: Anorexia/bulimia; Confinement Anxiety Immunizations Pneumococcal Vaccine: Received Pneumococcal Vaccination: No Implantable Devices Hospitalization / Surgery History Name of Hospital Purpose of Hospitalization/Surgery Date [facility] cellulitis on face 06/23/2015 Family and Social History Cancer: Yes - Paternal Grandparents; Diabetes: Yes - Paternal Grandparents,Maternal Grandparents,Mother; Heart Disease: Yes - Father; Hypertension:  Yes - Mother,Father; Kidney Disease: Yes -  Mother; Lung Disease: No; Seizures: No; Stroke: Yes - Father; Thyroid Problems: No; Tuberculosis: No; Never smoker; Marital Status - Married; Alcohol Use: Never; Drug Use: No History; Caffeine Use: Moderate; Advanced Directives: No; Patient does not want information on Advanced Directives; Do not resuscitate: No; Living Will: No; Medical Power of AttorneyMACK, ALVIDREZ ([id number]) Physician Affirmation I have reviewed and agree with the above information. Electronic Signature(s) Signed: 02/10/2017 4:37:13 PM By: Roger Shelter Signed: 02/10/2017 4:55:16 PM By: Lawanda Cousins Entered By: Lawanda Cousins on 02/10/2017 09:25:26 [name] ([id number]) -------------------------------------------------------------------------------- SuperBill Details Patient Name: [name] Date of Service: 02/10/2017 Medical Record Number: [id number] Patient Account Number: [id number] Date of Birth/Sex: 01/22/1971 (46 y.o. 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 I10 Essential (primary) hypertension Facility Procedures CPT4 Code Description: [id number] 11042 - DEB SUBQ TISSUE 20 SQ CM/< ICD-10 Diagnosis Description R42.706 Non-pressure chronic ulcer of other part of right lower leg wit Modifier: h fat layer expos Quantity: 1 ed Physician Procedures CPT4 Code Description: [id number] - WC PHYS SUBQ TISS 20 SQ CM ICD-10 Diagnosis Description H60.737 Non-pressure chronic  ulcer of other part of right lower leg wit Modifier: h fat layer expos Quantity: 1 ed Electronic Signature(s) Signed: 02/10/2017 4:55:16 PM By: Lawanda Cousins Entered By: Lawanda Cousins on 02/10/2017 09:33:43

## 2017-02-18 ENCOUNTER — Encounter: Payer: BC Managed Care – PPO | Admitting: Surgery

## 2017-02-18 DIAGNOSIS — E11622 Type 2 diabetes mellitus with other skin ulcer: Secondary | ICD-10-CM | POA: Diagnosis not present

## 2017-02-19 NOTE — Progress Notes (Addendum)
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 Erythema. Periwound temperature was noted as No Abnormality. The periwound has tenderness on palpation. 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 There was a Non-Viable Tissue Open Wound/Selective [phone]) debridement with total area of 0.42 sq cm performed by Christin Fudge, MD. with the following instrument(s): Forceps to remove Non-Viable tissue/material including Eschar after achieving pain control using Other (lidocaine 4). A time out was conducted at 13:35, prior to the start of the procedure. There was no bleeding. The  procedure was tolerated well with a pain level of 0 throughout and a pain level of 0 following the procedure. Post Debridement Measurements: 0.3cm length x 0.4cm width x 0.1cm depth; 0.009cm^3 volume. Character of Wound/Ulcer Post Debridement requires further debridement. Severity of Tissue Post Debridement is: Limited to breakdown of skin. Post procedure Diagnosis Wound #6: Same as Pre-Procedure Plan Wound Cleansing: Wound #6 Right,Anterior Lower Leg: Clean wound with Normal Saline. Cleanse wound with mild soap and water May Shower, gently pat wound dry prior to applying new dressing. Anesthetic (add to Medication List): Wound #6 Right,Anterior Lower Leg: Topical Lidocaine 4% cream applied to wound bed prior to debridement (In Clinic Only). Primary Wound Dressing: Wound #6 Right,Anterior Lower Leg: Prisma Ag Secondary Dressing: Wound #6 Right,Anterior Lower Leg: [place] Dressing Change Frequency: Wound #6 Right,Anterior Lower Leg: Change dressing every other day. Follow-up Appointments: Wound #6 Right,Anterior Lower Leg: Return Appointment in 2 weeks. Edema Control: Wound #6 Right,Anterior Lower Leg: Patient to wear own compression stockings Elevate legs to the level of the heart and pump ankles as often as possible Additional Orders / Instructions: Wound #6 Right,Anterior Lower Leg: Increase protein intake. Mora, Tyke ([id number]) I have recommended: 1. Prisma AG and a bordered foam 2. Elevation and exercise 3. Compression stockings 20-30 mmHg to be wound daily 4. Follow-up next week or two. Electronic Signature(s) Signed: 02/18/2017 4:19:50 PM By: Con Memos,  Roderick Pee MD, FACS Previous Signature: 02/18/2017 3:38:25 PM Version By: Christin Fudge MD, FACS Entered By: Christin Fudge on 02/18/2017 16:19:50 [name] ([id number]) -------------------------------------------------------------------------------- ROS/PFSH Details Patient Name: [name] Date  of Service: 02/18/2017 3:15 PM Medical Record Number: [id number] Patient Account Number: [id number] Date of Birth/Sex: 1970/11/11 (46 y.o. Male) Treating RN: Cornell Barman Primary Care Provider: [name] Other Clinician: Referring Provider: [name] Treating Provider/Extender: Frann Rider in Treatment: 3 Information Obtained From Patient Wound History Do you currently have one or more open woundso Yes How many open wounds do you currently haveo 1 Approximately how long have you had your woundso 2 weeks How have you been treating your wound(s) until nowo amoxicillin Has your wound(s) ever healed and then re-openedo No Have you had any lab work done in the past montho No Have you tested positive for an antibiotic resistant organism (MRSA, VRE)o Yes Have you had any tests for circulation on your legso Yes Where was the test doneo [facility] Have you had other problems associated with your woundso Swelling Eyes Medical History: Negative for: Cataracts; Glaucoma; Optic Neuritis Ear/Nose/Mouth/Throat Medical History: Negative for: Chronic sinus problems/congestion; Middle ear problems Hematologic/Lymphatic Medical History: Negative for: Anemia; Hemophilia; Human Immunodeficiency Virus; Lymphedema; Sickle Cell Disease Respiratory Medical History: Negative for: Aspiration; Asthma; Chronic Obstructive Pulmonary Disease (COPD); Pneumothorax; Sleep Apnea; Tuberculosis Cardiovascular Medical History: Positive for: Hypertension Negative for: Angina; Arrhythmia; Congestive Heart Failure; Coronary Artery Disease; Deep Vein Thrombosis; Hypotension; Myocardial Infarction; Peripheral Arterial Disease; Peripheral Venous Disease; Phlebitis; Vasculitis Gastrointestinal Medical History: Negative for: Cirrhosis ; Colitis; Crohnos; Hepatitis A; Hepatitis B; Hepatitis C Endocrine Wee, Hayes ([id number]) Medical History: Positive for: Type II Diabetes Negative for: Type I  Diabetes Time with diabetes: 7 years Treated with: Insulin Blood sugar tested every day: Yes Tested : before eat Genitourinary Medical History: Negative for: End Stage Renal Disease Immunological Medical History: Negative for: Lupus Erythematosus; Raynaudos; Scleroderma Integumentary (Skin) Medical History: Negative for: History of Burn; History of pressure wounds Musculoskeletal Medical History: Negative for: Gout; Rheumatoid Arthritis; Osteoarthritis; Osteomyelitis Neurologic Medical History: Negative for: Dementia; Neuropathy; Quadriplegia; Paraplegia; Seizure Disorder Oncologic Medical History: Negative for: Received Chemotherapy; Received Radiation Psychiatric Medical History: Negative for: Anorexia/bulimia; Confinement Anxiety Immunizations Pneumococcal Vaccine: Received Pneumococcal Vaccination: No Implantable Devices Hospitalization / Surgery History Name of Hospital Purpose of Hospitalization/Surgery Date [facility] cellulitis on face 06/23/2015 Family and Social History Cancer: Yes - Paternal Grandparents; Diabetes: Yes - Paternal Grandparents,Maternal Grandparents,Mother; Heart Disease: Yes - Father; Hypertension: Yes - Mother,Father; Kidney Disease: Yes - Mother; Lung Disease: No; Seizures: No; Stroke: Yes - Father; Thyroid Problems: No; Tuberculosis: No; Never smoker; Marital Status - Married; Alcohol Use: Never; Drug Use: No History; Caffeine Use: Moderate; Advanced Directives: No; Patient does not want information on Advanced Directives; Do not resuscitate: No; Living Will: No; Medical Power of AttorneySIDDARTH, HSIUNG ([id number]) Physician Affirmation I have reviewed and agree with the above information. Electronic Signature(s) Signed: 02/18/2017 4:17:30 PM By: Christin Fudge MD, FACS Signed: 02/18/2017 5:14:22 PM By: Gretta Cool BSN, RN, CWS, Kim RN, BSN Entered By: Christin Fudge on 02/18/2017 15:36:49 [name]  ([id number]) -------------------------------------------------------------------------------- SuperBill Details Patient Name: [name] Date of Service: 02/18/2017 Medical Record Number: [id number] Patient Account Number: [id number] Date of Birth/Sex: [birth year] (46 y.o. 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 I10 Essential (primary) hypertension Facility Procedures CPT4 Code: [id number] Description: 69507 - WOUND CARE VISIT-LEV 2 EST PT Modifier: Quantity: 1 Physician Procedures CPT4 Code Description: [id number] - WC PHYS LEVEL 3 - EST PT ICD-10 Diagnosis Description X83.358 Non-pressure chronic ulcer of other part of right lower leg wi L02.415 Cutaneous abscess of right lower limb E11.622 Type 2 diabetes mellitus with other  skin ulcer I10 Essential (primary) hypertension Modifier: th fat layer expo Quantity: 1 sed Electronic Signature(s) Signed: 02/18/2017 5:14:03 PM By: Gretta Cool, BSN, RN, CWS, Kim RN, BSN Previous Signature: 02/18/2017 3:38:41 PM Version By: Christin Fudge MD, FACS Entered By: Gretta Cool, BSN, RN, CWS, Kim on 02/18/2017 17:14:02

## 2017-02-19 NOTE — Progress Notes (Signed)
Macdonald, [name] ([id number]) Visit Report for 02/18/2017 Arrival Information Details Patient Name: [name], [name] Date of Service: 02/18/2017 3:15 PM Medical Record Number: [id number] Patient Account Number: [id number] Date of Birth/Sex: 1970-04-19 (46 y.o. Male) Treating RN: Cornell Barman Primary Care Rakeisha Nyce: [name] Other Clinician: Referring Nolen Lindamood: [name] Treating Gabriela Giannelli/Extender: Frann Rider in Treatment: 3 Visit Information History Since Last Visit Added or deleted any medications: No Patient Arrived: Ambulatory Any new allergies or adverse reactions: No Arrival Time: 15:16 Had a fall or experienced change in No Accompanied By: self activities of daily living that may affect Transfer Assistance: None risk of falls: Patient Identification Verified: Yes Signs or symptoms of abuse/neglect since last visito No Secondary Verification Process Yes Hospitalized since last visit: No Completed: Has Dressing in Place as Prescribed: No Patient Requires Transmission-Based No Has Compression in Place as Prescribed: No Precautions: Pain Present Now: No Patient Has Alerts: Yes Patient Alerts: DM II R ABI 1.23 from EPIC L ABI 1.21 from [employer]) Signed: 02/18/2017 5:14:22 PM By: Gretta Cool, BSN, RN, CWS, Kim RN, BSN Entered By: Gretta Cool, BSN, RN, CWS, Kim on 02/18/2017 15:17:22 [name] ([id number]) -------------------------------------------------------------------------------- Encounter Discharge Information Details Patient Name: [name] Date of Service: 02/18/2017 3:15 PM Medical Record Number: [id number] Patient Account Number: [id number] Date of Birth/Sex: [birth year] (46 y.o. Male) Treating RN: Cornell Barman Primary Care Vinetta Brach: [name] Other Clinician: Referring Dijon Kohlman: [name] Treating Jaely Silman/Extender: Frann Rider in Treatment: 3 Encounter Discharge Information Items Discharge Pain Level:  0 Discharge Condition: Stable Ambulatory Status: Ambulatory Discharge Destination: Home Transportation: Private Auto Accompanied By: self Schedule Follow-up Appointment: Yes Medication Reconciliation completed and Yes provided to Patient/Care Maurion [place]: Patient Clinical Summary of Care: Declined Electronic Signature(s) Signed: 02/18/2017 5:14:22 PM By: Gretta Cool, BSN, RN, CWS, Kim RN, BSN Entered By: Gretta Cool, BSN, RN, CWS, Kim on 02/18/2017 15:42:04 [name] ([id number]) -------------------------------------------------------------------------------- Lower Extremity Assessment Details Patient Name: [name] Date of Service: 02/18/2017 3:15 PM Medical Record Number: [id number] Patient Account Number: [id number] Date of Birth/Sex: September 18, 1970 (46 y.o. Male) Treating RN: Cornell Barman Primary Care [place]: [name] Other Clinician: Referring Reuben Knoblock: [name] Treating Ikenna Ohms/Extender: Frann Rider in Treatment: 3 Edema Assessment Assessed: [Left: No] [Right: No] [Left: Edema] [Right: :] Calf Left: Right: Point of Measurement: 39 cm From Medial Instep cm 39.5 cm Ankle Left: Right: Point of Measurement: 11 cm From Medial Instep cm 26.2 cm Vascular Assessment Pulses: Dorsalis Pedis Palpable: [Right:Yes] Posterior Tibial Extremity colors, hair growth, and conditions: Extremity Color: [Right:Normal] Hair Growth on Extremity: [Right:Yes] Temperature of Extremity: [Right:Warm] Capillary Refill: [Right:< 3 seconds] Toe Nail Assessment Left: Right: Thick: Yes Discolored: Yes Deformed: No Improper Length and Hygiene: No Electronic Signature(s) Signed: 02/18/2017 5:14:22 PM By: Gretta Cool, BSN, RN, CWS, Kim RN, BSN Entered By: Gretta Cool, BSN, RN, CWS, Kim on 02/18/2017 15:24:37 [name] ([id number]) -------------------------------------------------------------------------------- Multi Wound Chart Details Patient Name: [name] Date of  Service: 02/18/2017 3:15 PM Medical Record Number: [id number] Patient Account Number: [id number] Date of Birth/Sex: 04-May-1970 (46 y.o. Male) Treating RN: Cornell Barman Primary Care [place]unna Grindstaff: [name] Other Clinician: Referring Tam Savoia: [name] Treating Anthem Frazer/Extender: Frann Rider in Treatment: 3 Vital Signs Height(in): 75 Pulse(bpm): 48 Weight(lbs): 240.8 Blood Pressure(mmHg): 147/87 Body Mass Index(BMI): 30 Temperature(F): 98.1 Respiratory Rate 16 (breaths/min): Photos: [6:No Photos] [N/A:N/A] Wound Location: [6:Right Lower Leg - Anterior] [N/A:N/A] Wounding Event: [6:Gradually Appeared] [N/A:N/A] Primary Etiology: [6:Diabetic Wound/Ulcer of the N/A Lower Extremity] Comorbid History: [6:Hypertension, Type II Diabetes N/A] Date Acquired: [6:01/10/2017] [N/A:N/A]  Weeks of Treatment: [6:3] [N/A:N/A] Wound Status: [6:Open] [N/A:N/A] Measurements L x W x D [6:0.6x0.7x0.1] [N/A:N/A] (cm) Area (cm) : [6:0.33] [N/A:N/A] Volume (cm) : [6:0.033] [N/A:N/A] % Reduction in Area: [6:72.00%] [N/A:N/A] % Reduction in Volume: [6:86.00%] [N/A:N/A] Classification: [6:Grade 2] [N/A:N/A] Exudate Amount: [6:Large] [N/A:N/A] Exudate Type: [6:Serous] [N/A:N/A] Exudate Color: [6:amber] [N/A:N/A] Wound Margin: [6:Distinct, outline attached] [N/A:N/A] Granulation Amount: [6:Small (1-33%)] [N/A:N/A] Granulation Quality: [6:Red] [N/A:N/A] Necrotic Amount: [6:Large (67-100%)] [N/A:N/A] Necrotic Tissue: [6:Eschar, Adherent Slough] [N/A:N/A] Exposed Structures: [6:Fat Layer (Subcutaneous Tissue) Exposed: Yes Fascia: No Tendon: No Muscle: No Joint: No Bone: No] [N/A:N/A] Epithelialization: [6:Large (67-100%)] [N/A:N/A] Periwound Skin Texture: [6:Callus: Yes Excoriation: No Induration: No Crepitus: No] [N/A:N/A] Rash: No Scarring: No Periwound Skin Moisture: Dry/Scaly: Yes N/A N/A Maceration: No Periwound Skin Color: Atrophie Blanche: No N/A N/A Cyanosis: No Ecchymosis:  No Erythema: No Hemosiderin Staining: No Mottled: No Pallor: No Rubor: No Temperature: No Abnormality N/A N/A Tenderness on Palpation: Yes N/A N/A Wound Preparation: Ulcer Cleansing: N/A N/A Rinsed/Irrigated with Saline Topical Anesthetic Applied: Other: lidocaine 4% Treatment Notes Electronic Signature(s) Signed: 02/18/2017 3:36:16 PM By: Christin Fudge MD, FACS Entered By: Christin Fudge on 02/18/2017 15:36:16 [name] ([id number]) -------------------------------------------------------------------------------- [place] Details Patient Name: [name] Date of Service: 02/18/2017 3:15 PM Medical Record Number: [id number] Patient Account Number: [id number] Date of Birth/Sex: [birth year] (46 y.o. Male) Treating RN: Cornell Barman Primary Care Shanard Treto: [name] Other Clinician: Referring Godson Pollan: [name] Treating Kincaid Tiger/Extender: Frann Rider in Treatment: 3 Active Inactive ` Abuse / Safety / Falls / Self Care Management Nursing Diagnoses: History of Falls Potential for falls Goals: Patient will not experience any injury related to falls Date Initiated: 01/24/2017 Target Resolution Date: 04/30/2017 Goal Status: Active Interventions: Assess Activities of Daily Living upon admission and as needed Assess fall risk on admission and as needed Assess: immobility, friction, shearing, incontinence upon admission and as needed Assess impairment of mobility on admission and as needed per policy Assess personal safety and home safety (as indicated) on admission and as needed Notes: ` Nutrition Nursing Diagnoses: Imbalanced nutrition Impaired glucose control: actual or potential Potential for alteratiion in Nutrition/Potential for imbalanced nutrition Goals: Patient/caregiver agrees to and verbalizes understanding of need to use nutritional supplements and/or vitamins as prescribed Date Initiated: 01/24/2017 Target Resolution  Date: 05/28/2017 Goal Status: Active Patient/caregiver will maintain therapeutic glucose control Date Initiated: 01/24/2017 Target Resolution Date: 04/30/2017 Goal Status: Active Interventions: Assess patient nutrition upon admission and as needed per policy Provide education on elevated blood sugars and impact on wound healing NotesFERAS, GARDELLA ([id number]) Orientation to the Wound Care Program Nursing Diagnoses: Knowledge deficit related to the wound healing center program Goals: Patient/caregiver will verbalize understanding of the [place] Program Date Initiated: 01/24/2017 Target Resolution Date: 02/26/2017 Goal Status: Active Interventions: Provide education on orientation to the wound center Notes: ` Pain, Acute or Chronic Nursing Diagnoses: Pain, acute or chronic: actual or potential Potential alteration in comfort, pain Goals: Patient/caregiver will verbalize adequate pain control between visits Date Initiated: 01/24/2017 Target Resolution Date: 05/28/2017 Goal Status: Active Interventions: Complete pain assessment as per visit requirements Notes: ` Wound/Skin Impairment Nursing Diagnoses: Impaired tissue integrity Knowledge deficit related to ulceration/compromised skin integrity Goals: Ulcer/skin breakdown will have a volume reduction of 80% by week 12 Date Initiated: 01/24/2017 Target Resolution Date: 05/28/2017 Goal Status: Active Interventions: Assess patient/caregiver ability to perform ulcer/skin care regimen upon admission and as needed Assess ulceration(s) every visit Notes: Electronic Signature(s) Signed: 02/18/2017 5:14:22 PM By:  Gretta Cool, BSN, RN, CWS, Kim RN, BSN [place], [place] ([id number]) Entered By: Gretta Cool, BSN, RN, CWS, Kim on 02/18/2017 15:25:06 [name] ([id number]) -------------------------------------------------------------------------------- Pain Assessment Details Patient Name: [name] Date of Service:  02/18/2017 3:15 PM Medical Record Number: [id number] Patient Account Number: [id number] Date of Birth/Sex: 02-06-1971 (46 y.o. Male) Treating RN: Cornell Barman Primary Care Yarelli Decelles: [name] Other Clinician: Referring Minas Bonser: [name] Treating Jeanenne Licea/Extender: Frann Rider in Treatment: 3 Active Problems Location of Pain Severity and Description of Pain Patient Has Paino No Site Locations With Dressing Change: No Pain Management and Medication Current Pain Management: Goals for Pain Management Topical or injectable lidocaine is offered to patient for acute pain when surgical debridement is performed. If needed, Patient is instructed to use over the counter pain medication for the following 24-48 hours after debridement. Wound care MDs do not prescribed pain medications. Patient has chronic pain or uncontrolled pain. Patient has been instructed to make an appointment with their Primary Care Physician for pain management. Electronic Signature(s) Signed: 02/18/2017 5:14:22 PM By: Gretta Cool, BSN, RN, CWS, Kim RN, BSN Entered By: Gretta Cool, BSN, RN, CWS, Kim on 02/18/2017 15:17:30 [name] ([id number]) -------------------------------------------------------------------------------- Patient/Caregiver Education Details Patient Name: [name] Date of Service: 02/18/2017 3:15 PM Medical Record Number: [id number] Patient Account Number: [id number] Date of Birth/Gender: 1971/01/18 (46 y.o. Male) Treating RN: Cornell Barman Primary Care Physician: [name] Other Clinician: Referring Physician: [name] Treating Physician/Extender: Frann Rider in Treatment: 3 Education Assessment Education Provided To: Patient Education Topics Provided Wound/Skin Impairment: Handouts: Caring for Your Ulcer Methods: Demonstration, Explain/Verbal Responses: State content correctly Electronic Signature(s) Signed: 02/18/2017 5:14:22 PM By: Gretta Cool, BSN, RN, CWS, Kim  RN, BSN Entered By: Gretta Cool, BSN, RN, CWS, Kim on 02/18/2017 15:42:18 [name] ([id number]) -------------------------------------------------------------------------------- Wound Assessment Details Patient Name: [name] Date of Service: 02/18/2017 3:15 PM Medical Record Number: [id number] Patient Account Number: [id number] Date of Birth/Sex: 27-Aug-1970 (46 y.o. Male) Treating RN: Cornell Barman Primary Care Ski Polich: [name] Other Clinician: Referring Mikita Lesmeister: [name] Treating Joyous Gleghorn/Extender: Frann Rider in Treatment: 3 Wound Status Wound Number: 6 Primary Etiology: Diabetic Wound/Ulcer of the Lower Extremity Wound Location: Right Lower Leg - Anterior Wound Status: Open Wounding Event: Gradually Appeared Comorbid Hypertension, Type II Diabetes Date Acquired: 01/10/2017 History: Weeks Of Treatment: 3 Clustered Wound: No Wound Measurements Length: (cm) 0.6 Width: (cm) 0.7 Depth: (cm) 0.1 Area: (cm) 0.33 Volume: (cm) 0.033 % Reduction in Area: 72% % Reduction in Volume: 86% Epithelialization: Large (67-100%) Tunneling: No Undermining: No Wound Description Classification: Grade 2 Wound Margin: Distinct, outline attached Exudate Amount: Large Exudate Type: Serous Exudate Color: amber Foul Odor After Cleansing: No Slough/Fibrino Yes Wound Bed Granulation Amount: Small (1-33%) Exposed Structure Granulation Quality: Red Fascia Exposed: No Necrotic Amount: Large (67-100%) Fat Layer (Subcutaneous Tissue) Exposed: Yes Necrotic Quality: Eschar, Adherent Slough Tendon Exposed: No Muscle Exposed: No Joint Exposed: No Bone Exposed: No Periwound Skin Texture Texture Color No Abnormalities Noted: No No Abnormalities Noted: No Callus: Yes Atrophie Blanche: No Crepitus: No Cyanosis: No Excoriation: No Ecchymosis: No Induration: No Erythema: No Rash: No Hemosiderin Staining: No Scarring: No Mottled: No Pallor:  No Moisture Rubor: No No Abnormalities Noted: No Dry / Scaly: Yes Temperature / Pain Maceration: No Temperature: No Abnormality Tenderness on Palpation: Yes Hinnant, Javis ([id number]) Wound Preparation Ulcer Cleansing: Rinsed/Irrigated with Saline Topical Anesthetic Applied: Other: lidocaine 4%, Treatment Notes Wound #6 (Right, Anterior Lower Leg) 1. Cleansed with: Clean wound with Normal Saline 2. Anesthetic Topical Lidocaine  4% cream to wound bed prior to debridement 4. Dressing Applied: Prisma Ag 5. Secondary [place] 7. Secured with Patient to wear own compression stockings Electronic Signature(s) Signed: 02/18/2017 5:14:22 PM By: Gretta Cool, BSN, RN, CWS, Kim RN, BSN Entered By: Gretta Cool, BSN, RN, CWS, Kim on 02/18/2017 15:22:57 [name] ([id number]) -------------------------------------------------------------------------------- Vitals Details Patient Name: [name] Date of Service: 02/18/2017 3:15 PM Medical Record Number: [id number] Patient Account Number: [id number] Date of Birth/Sex: 1970/10/30 (46 y.o. Male) Treating RN: Cornell Barman Primary Care Trevion Hoben: [name] Other Clinician: Referring Hollin Crewe: [name] Treating Adenike Shidler/Extender: Frann Rider in Treatment: 3 Vital Signs Time Taken: 15:18 Temperature (F): 98.1 Height (in): 75 Pulse (bpm): 87 Weight (lbs): 240.8 Respiratory Rate (breaths/min): 16 Body Mass Index (BMI): 30.1 Blood Pressure (mmHg): 147/87 Reference Range: 80 - 120 mg / dl Electronic Signature(s) Signed: 02/18/2017 5:14:22 PM By: Gretta Cool, BSN, RN, CWS, Kim RN, BSN Entered By: Gretta Cool, BSN, RN, CWS, Kim on 02/18/2017 15:18:59

## 2017-02-28 ENCOUNTER — Ambulatory Visit (HOSPITAL_COMMUNITY)
Admission: RE | Admit: 2017-02-28 | Discharge: 2017-02-28 | Disposition: A | Discharge status: Home / Self Care | Payer: BC Managed Care – PPO | Source: Ambulatory Visit | Attending: Nephrology | Admitting: Nephrology

## 2017-02-28 ENCOUNTER — Encounter (HOSPITAL_COMMUNITY): Payer: Self-pay

## 2017-03-04 ENCOUNTER — Ambulatory Visit: Payer: BC Managed Care – PPO | Admitting: Physician Assistant

## 2017-03-08 ENCOUNTER — Ambulatory Visit: Payer: BC Managed Care – PPO | Admitting: Physician Assistant

## 2017-03-09 ENCOUNTER — Encounter: Payer: BC Managed Care – PPO | Attending: Internal Medicine | Admitting: Internal Medicine

## 2017-03-09 DIAGNOSIS — I129 Hypertensive chronic kidney disease with stage 1 through stage 4 chronic kidney disease, or unspecified chronic kidney disease: Secondary | ICD-10-CM | POA: Diagnosis not present

## 2017-03-09 DIAGNOSIS — E11622 Type 2 diabetes mellitus with other skin ulcer: Secondary | ICD-10-CM | POA: Diagnosis present

## 2017-03-09 DIAGNOSIS — N183 Chronic kidney disease, stage 3 (moderate): Secondary | ICD-10-CM | POA: Diagnosis not present

## 2017-03-09 DIAGNOSIS — Z794 Long term (current) use of insulin: Secondary | ICD-10-CM | POA: Diagnosis not present

## 2017-03-09 DIAGNOSIS — Z89412 Acquired absence of left great toe: Secondary | ICD-10-CM | POA: Diagnosis not present

## 2017-03-09 DIAGNOSIS — L02415 Cutaneous abscess of right lower limb: Secondary | ICD-10-CM | POA: Insufficient documentation

## 2017-03-09 DIAGNOSIS — L97812 Non-pressure chronic ulcer of other part of right lower leg with fat layer exposed: Secondary | ICD-10-CM | POA: Insufficient documentation

## 2017-03-09 DIAGNOSIS — E1122 Type 2 diabetes mellitus with diabetic chronic kidney disease: Secondary | ICD-10-CM | POA: Insufficient documentation

## 2017-03-10 NOTE — Progress Notes (Signed)
XYLAN, SHEILS ([id number]) Visit Report for 03/09/2017 Arrival Information Details Patient Name: [name] Date of Service: 03/09/2017 8:30 AM Medical Record Number: [id number] Patient Account Number: [id number] Date of Birth/Sex: [birth year] (47 y.o. Male) Treating RN: Roger Shelter Primary Care Jenene Kauffmann: [name] Other Clinician: Referring Dequante Tremaine: [name] Treating Iviana Blasingame/Extender: Tito Dine in Treatment: 6 Visit Information History Since Last Visit All ordered tests and consults were completed: No Patient Arrived: Ambulatory Added or deleted any medications: No Arrival Time: 08:10 Any new allergies or adverse reactions: No Accompanied By: self Had a fall or experienced change in No Transfer Assistance: None activities of daily living that may affect Patient Requires Transmission-Based No risk of falls: Precautions: Signs or symptoms of abuse/neglect since last visito No Patient Has Alerts: Yes Hospitalized since last visit: No Patient Alerts: DM II Pain Present Now: No R ABI 1.23 from [facility] L ABI 1.21 from [facility] Electronic Signature(s) Signed: 03/09/2017 4:51:06 PM By: Roger Shelter Entered By: Roger Shelter on 03/09/2017 08:11:26 [name] ([id number]) -------------------------------------------------------------------------------- Encounter Discharge Information Details Patient Name: [name] Date of Service: 03/09/2017 8:30 AM Medical Record Number: [id number] Patient Account Number: [id number] Date of Birth/Sex: [birth year] (47 y.o. Male) Treating RN: Cornell Barman Primary Care Darika Ildefonso: [name] Other Clinician: Referring Tambi Thole: [name] Treating Tinzley Dalia/Extender: Tito Dine in Treatment: 6 Encounter Discharge Information Items Discharge Pain Level: 0 Discharge Condition: Stable Ambulatory Status: Ambulatory Discharge Destination: Home Transportation: Private Auto Accompanied  By: self Schedule Follow-up Appointment: Yes Medication Reconciliation completed and No provided to Patient/Care Schylar Wuebker: Patient Clinical Summary of Care: Declined Electronic Signature(s) Signed: 03/09/2017 8:57:26 AM By: Roger Shelter Entered By: Roger Shelter on 03/09/2017 08:57:26 [name] ([id number]) -------------------------------------------------------------------------------- Lower Extremity Assessment Details Patient Name: [name] Date of Service: 03/09/2017 8:30 AM Medical Record Number: [id number] Patient Account Number: [id number] Date of Birth/Sex: [birth year] (47 y.o. Male) Treating RN: Roger Shelter Primary Care Marlo Goodrich: [name] Other Clinician: Referring Timisha Mondry: [name] Treating Abdirahman Chittum/Extender: [name] Weeks in Treatment: 6 Edema Assessment Assessed: [Left: No] [Right: No] [Left: Edema] [Right: :] Calf Left: Right: Point of Measurement: cm From Medial Instep cm 38 cm Ankle Left: Right: Point of Measurement: 11 cm From Medial Instep cm 27 cm Vascular Assessment Claudication: Claudication Assessment [Right:None] Pulses: Dorsalis Pedis Palpable: [Right:Yes] Posterior Tibial Extremity colors, hair growth, and conditions: Extremity Color: [Right:Normal] Hair Growth on Extremity: [Right:Yes] Temperature of Extremity: [Right:Warm] Capillary Refill: [Right:< 3 seconds] Toe Nail Assessment Left: Right: Thick: No Discolored: No Deformed: No Improper Length and Hygiene: No Electronic Signature(s) Signed: 03/09/2017 4:51:06 PM By: Roger Shelter Entered By: Roger Shelter on 03/09/2017 08:20:09 [name] ([id number]) -------------------------------------------------------------------------------- Multi Wound Chart Details Patient Name: [name] Date of Service: 03/09/2017 8:30 AM Medical Record Number: [id number] Patient Account Number: [id number] Date of Birth/Sex: 03-04-1970 (47 y.o.  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] Comorbid History: [6:Hypertension, Type II Diabetes N/A] Date Acquired: [6:01/10/2017] [N/A:N/A] Weeks of Treatment: [6:6] [N/A:N/A] Wound Status: [6:Open] [N/A:N/A] Measurements L x W x D [6:0.6x0.7x0.1] [N/A:N/A] (cm) Area (cm) : [6:0.33] [N/A:N/A] Volume (cm) : [6:0.033] [N/A:N/A] % Reduction in Area: [  6:72.00%] [N/A:N/A] % Reduction in Volume: [6:86.00%] [N/A:N/A] Classification: [6:Grade 2] [N/A:N/A] Exudate Amount: [6:None Present] [N/A:N/A] Wound Margin: [6:Distinct, outline attached] [N/A:N/A] Granulation Amount: [6:None Present (0%)] [N/A:N/A] Necrotic Amount: [6:Large (67-100%)] [N/A:N/A] Necrotic Tissue: [6:Eschar] [N/A:N/A] Exposed Structures: [6:Fascia: No Fat Layer (Subcutaneous Tissue) Exposed: No Tendon: No Muscle: No Joint: No Bone: No Limited to Skin Breakdown] [N/A:N/A] Epithelialization: [6:Large (67-100%)] [N/A:N/A] Debridement: [6:Debridement (11042-11047)] [N/A:N/A] Pre-procedure [6:08:29] [N/A:N/A] Verification/Time Out Taken: Pain Control: [6:Other] [N/A:N/A] Tissue Debrided: [6:Necrotic/Eschar, Fibrin/Slough, Skin, Subcutaneous] [N/A:N/A] Level: Skin/Subcutaneous Tissue N/A N/A Debridement Area (sq cm): 0.42 N/A N/A Instrument: Curette N/A N/A Bleeding: Minimum N/A N/A Hemostasis Achieved: Pressure N/A N/A Procedural Pain: 0 N/A N/A Post Procedural Pain: 0 N/A  N/A Debridement Treatment Procedure was tolerated well N/A N/A Response: Post Debridement 0.6x0.7x0.2 N/A N/A Measurements L x W x D (cm) Post Debridement Volume: 0.066 N/A N/A (cm) Periwound Skin Texture: Callus: Yes N/A N/A Excoriation: No Induration: No Crepitus: No Rash: No Scarring: No Periwound Skin Moisture: Maceration: No N/A N/A Dry/Scaly: No Periwound Skin Color: Atrophie Blanche: No N/A N/A Cyanosis: No Ecchymosis: No Erythema: No Hemosiderin Staining: No Mottled: No Pallor: No Rubor: No Temperature: No Abnormality N/A N/A Tenderness on Palpation: Yes N/A N/A Wound Preparation: Ulcer Cleansing: N/A N/A Rinsed/Irrigated with Saline Topical Anesthetic Applied: Other: lidocaine 4% Procedures Performed: Debridement N/A N/A Treatment Notes Wound #6 (Right, Anterior Lower Leg) 1. Cleansed with: Clean wound with Normal Saline 2. Anesthetic Topical Lidocaine 4% cream to wound bed prior to debridement 4. Dressing Applied: Prisma Ag 5. Secondary Dressing Applied Bordered Foam Dressing Electronic Signature(s) Signed: 03/09/2017 4:56:29 PM By: Linton Ham MD Entered By: Linton Ham on 03/09/2017 09:13:42 [name] ([id number]) [place], [name] ([id number]) -------------------------------------------------------------------------------- Multi-Disciplinary Care Plan Details Patient Name: [name] Date of Service: 03/09/2017 8:30 AM Medical Record Number: [id number] Patient Account Number: [id number] Date of Birth/Sex: 04-22-1970 (47 y.o. Male) Treating RN: Roger Shelter Primary Care Bryelle Spiewak: [name] Other Clinician: Referring Norberto Wishon: [name] Treating Meeka Cartelli/Extender: Tito Dine in Treatment: 6 Active Inactive ` Abuse / Safety / Falls / Self Care Management Nursing Diagnoses: History of Falls Potential for falls Goals: Patient will not experience any injury related to falls Date Initiated:  01/24/2017 Target Resolution Date: 04/30/2017 Goal Status: Active Interventions: Assess Activities of Daily Living upon admission and as needed Assess fall risk on admission and as needed Assess: immobility, friction, shearing, incontinence upon admission and as needed Assess impairment of mobility on admission and as needed per policy Assess personal safety and home safety (as indicated) on admission and as needed Notes: ` Nutrition Nursing Diagnoses: Imbalanced nutrition Impaired glucose control: actual or potential Potential for alteratiion in Nutrition/Potential for imbalanced nutrition Goals: Patient/caregiver agrees to and verbalizes understanding of need to use nutritional supplements and/or vitamins as prescribed Date Initiated: 01/24/2017 Target Resolution Date: 05/28/2017 Goal Status: Active Patient/caregiver will maintain therapeutic glucose control Date Initiated: 01/24/2017 Target Resolution Date: 04/30/2017 Goal Status: Active Interventions: Assess patient nutrition upon admission and as needed per policy Provide education on elevated blood sugars and impact on wound healing Notes: DEFOREST, MAIDEN ([id number]) Orientation to the Wound Care Program Nursing Diagnoses: Knowledge deficit related to the wound healing center program Goals: Patient/caregiver will verbalize understanding of the [facility] Date Initiated: 01/24/2017 Target Resolution Date: 02/26/2017 Goal Status: Active Interventions: Provide education on orientation to the wound center Notes: ` Pain, Acute or Chronic Nursing Diagnoses: Pain, acute or chronic: actual or potential Potential alteration in comfort, pain  Goals: Patient/caregiver will verbalize adequate pain control between visits Date Initiated: 01/24/2017 Target Resolution Date: 05/28/2017 Goal Status: Active Interventions: Complete pain assessment as per visit requirements Notes: ` Wound/Skin Impairment Nursing  Diagnoses: Impaired tissue integrity Knowledge deficit related to ulceration/compromised skin integrity Goals: Ulcer/skin breakdown will have a volume reduction of 80% by week 12 Date Initiated: 01/24/2017 Target Resolution Date: 05/28/2017 Goal Status: Active Interventions: Assess patient/caregiver ability to perform ulcer/skin care regimen upon admission and as needed Assess ulceration(s) every visit Notes: Electronic Signature(s) Signed: 03/09/2017 4:51:06 PM By: Michaelle Copas, Ovid ([id number]) Entered By: Roger Shelter on 03/09/2017 08:20:14 [name] ([id number]) -------------------------------------------------------------------------------- Pain Assessment Details Patient Name: [name] Date of Service: 03/09/2017 8:30 AM Medical Record Number: [id number] Patient Account Number: [id number] Date of Birth/Sex: 11-Aug-1970 (47 y.o. Male) Treating RN: Roger Shelter Primary Care Hershy Flenner: [name] Other Clinician: Referring Deronda Christian: [name] Treating Ariany Kesselman/Extender: [name] Weeks in Treatment: 6 Active Problems Location of Pain Severity and Description of Pain Patient Has Paino No Site Locations Pain Management and Medication Current Pain Management: Electronic Signature(s) Signed: 03/09/2017 4:51:06 PM By: Roger Shelter Entered By: Roger Shelter on 03/09/2017 08:11:32 [name] ([id number]) -------------------------------------------------------------------------------- Patient/Caregiver Education Details Patient Name: [name] Date of Service: 03/09/2017 8:30 AM Medical Record Number: [id number] Patient Account Number: [id number] Date of Birth/Gender: [birth year] (47 y.o. Male) Treating RN: Roger Shelter Primary Care Physician: [name] Other Clinician: Referring Physician: [name] Treating Physician/Extender: Tito Dine in Treatment: 6 Education  Assessment Education Provided To: Patient Education Topics Provided Wound Debridement: Handouts: Wound Debridement Methods: Explain/Verbal Responses: State content correctly Wound/Skin Impairment: Handouts: Caring for Your Ulcer Methods: Explain/Verbal Responses: State content correctly Electronic Signature(s) Signed: 03/09/2017 4:51:06 PM By: Roger Shelter Entered By: Roger Shelter on 03/09/2017 08:57:47 [name] ([id number]) -------------------------------------------------------------------------------- Wound Assessment Details Patient Name: [name] Date of Service: 03/09/2017 8:30 AM Medical Record Number: [id number] Patient Account Number: [id number] Date of Birth/Sex: 25-Dec-1970 (47 y.o. Male) Treating RN: Roger Shelter Primary Care Kathrina Crosley: [name] Other Clinician: Referring Ezra Marquess: [name] Treating Durinda Buzzelli/Extender: [name] Weeks in Treatment: 6 Wound Status Wound Number: 6 Primary Etiology: Diabetic Wound/Ulcer of the Lower Extremity Wound Location: Right Lower Leg - Anterior Wound Status: Open Wounding Event: Gradually Appeared Comorbid Hypertension, Type II Diabetes Date Acquired: 01/10/2017 History: Weeks Of Treatment: 6 Clustered Wound: No Photos Photo Uploaded By: Roger Shelter on 03/09/2017 16:44:15 Wound Measurements Length: (cm) 0.6 Width: (cm) 0.7 Depth: (cm) 0.1 Area: (cm) 0.33 Volume: (cm) 0.033 % Reduction in Area: 72% % Reduction in Volume: 86% Epithelialization: Large (67-100%) Tunneling: No Undermining: No Wound Description Classification: Grade 2 Wound Margin: Distinct, outline attached Exudate Amount: None Present Foul Odor After Cleansing: No Slough/Fibrino Yes Wound Bed Granulation Amount: None Present (0%) Exposed Structure Necrotic Amount: Large (67-100%) Fascia Exposed: No Necrotic Quality: Eschar Fat Layer (Subcutaneous Tissue) Exposed: No Tendon Exposed: No Muscle  Exposed: No Joint Exposed: No Bone Exposed: No Limited to Skin Breakdown Periwound Skin Texture Texture Color Berka, Swain ([id number]) No Abnormalities Noted: No No Abnormalities Noted: No Callus: Yes Atrophie Blanche: No Crepitus: No Cyanosis: No Excoriation: No Ecchymosis: No Induration: No Erythema: No Rash: No Hemosiderin Staining: No Scarring: No Mottled: No Pallor: No Moisture Rubor: No No Abnormalities Noted: No Dry / Scaly: No Temperature / Pain Maceration: No Temperature: No Abnormality Tenderness on Palpation: Yes Wound Preparation Ulcer Cleansing: Rinsed/Irrigated with Saline Topical Anesthetic Applied: Other: lidocaine 4%, Treatment Notes Wound #6 (Right, Anterior Lower Leg) 1.  Cleansed with: Clean wound with Normal Saline 2. Anesthetic Topical Lidocaine 4% cream to wound bed prior to debridement 4. Dressing Applied: Prisma Ag 5. Secondary Dressing Applied Bordered Foam Dressing Electronic Signature(s) Signed: 03/09/2017 4:51:06 PM By: Roger Shelter Entered By: Roger Shelter on 03/09/2017 08:17:22 [name] ([id number]) -------------------------------------------------------------------------------- Vitals Details Patient Name: [name] Date of Service: 03/09/2017 8:30 AM Medical Record Number: [id number] Patient Account Number: [id number] Date of Birth/Sex: [birth year] (47 y.o. Male) Treating RN: Roger Shelter Primary Care Jala Dundon: [name] Other Clinician: Referring Ephrata Verville: [name] Treating Davionte Lusby/Extender: Tito Dine in Treatment: 6 Vital Signs Time Taken: 08:15 Temperature (F): 98.2 Height (in): 75 Pulse (bpm): 88 Weight (lbs): 240.8 Respiratory Rate (breaths/min): 18 Body Mass Index (BMI): 30.1 Blood Pressure (mmHg): 153/92 Reference Range: 80 - 120 mg / dl Electronic Signature(s) Signed: 03/09/2017 4:51:06 PM By: Roger Shelter Entered By: Roger Shelter on 03/09/2017  08:15:25

## 2017-03-10 NOTE — Progress Notes (Signed)
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 I10 Essential (primary) hypertension Facility Procedures CPT4 Code Description: [id number] 11042 - DEB SUBQ TISSUE 20 SQ CM/< ICD-10 Diagnosis Description A76.811 Non-pressure chronic ulcer of other part of right lower leg wit Modifier: h fat layer expo Quantity: 1 sed Physician Procedures CPT4 Code Description: [id number] 11042 - WC PHYS SUBQ TISS 20 SQ CM ICD-10 Diagnosis Description T59.741 Non-pressure chronic ulcer of other part of right lower leg wit Modifier: h fat layer expo Quantity: 1 sed Electronic Signature(s) Signed: 03/09/2017 4:56:29 PM By: Linton Ham MD Entered By: Linton Ham on 03/09/2017 09:16:47

## 2017-03-14 ENCOUNTER — Ambulatory Visit (HOSPITAL_COMMUNITY): Admission: RE | Admit: 2017-03-14 | Payer: BC Managed Care – PPO | Source: Ambulatory Visit

## 2017-03-23 ENCOUNTER — Ambulatory Visit: Payer: BC Managed Care – PPO | Admitting: Internal Medicine

## 2017-03-23 ENCOUNTER — Encounter: Payer: BC Managed Care – PPO | Admitting: Internal Medicine

## 2017-03-23 DIAGNOSIS — E11622 Type 2 diabetes mellitus with other skin ulcer: Secondary | ICD-10-CM | POA: Diagnosis not present
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
## 2017-03-27 NOTE — Progress Notes (Signed)
PEREZ, DIRICO ([id number]) Visit Report for 03/23/2017 Arrival Information Details Patient Name: [name], [name] Date of Service: 03/23/2017 2:30 PM Medical Record Number: [id number] Patient Account Number: [id number] Date of Birth/Sex: Oct 02, 1970 (47 y.o. Male) Treating RN: Ahmed Prima Primary Care Dollie Bressi: [name] Other Clinician: Referring Kyeshia Zinn: [name] Treating Nerissa Constantin/Extender: Tito Dine in Treatment: 8 Visit Information History Since Last Visit All ordered tests and consults were completed: No Patient Arrived: Ambulatory Added or deleted any medications: No Arrival Time: 14:52 Any new allergies or adverse reactions: No Accompanied By: self Had a fall or experienced change in No Transfer Assistance: None activities of daily living that may affect Patient Identification Verified: Yes risk of falls: Secondary Verification Process Yes Signs or symptoms of abuse/neglect since last visito No Completed: Hospitalized since last visit: No Patient Requires Transmission-Based No Has Dressing in Place as Prescribed: Yes Precautions: Pain Present Now: Yes Patient Has Alerts: Yes Patient Alerts: DM II R ABI 1.23 from EPIC L ABI 1.21 from [facility] Electronic Signature(s) Signed: 03/25/2017 4:17:47 PM By: Alric Quan Entered By: Alric Quan on 03/23/2017 14:52:57 [name] ([id number]) -------------------------------------------------------------------------------- Clinic Level of Care Assessment Details Patient Name: [name] Date of Service: 03/23/2017 2:30 PM Medical Record Number: [id number] Patient Account Number: [id number] Date of Birth/Sex: December 24, 1970 (47 y.o. Male) Treating RN: Carolyne Fiscal, Debi Primary Care Marti Mclane: [name] Other Clinician: Referring Aarya Quebedeaux: [name] Treating Rutger Salton/Extender: Tito Dine in Treatment: 8 Clinic Level of Care Assessment Items TOOL 4 Quantity Score X  - Use when only an EandM is performed on FOLLOW-UP visit 1 0 ASSESSMENTS - Nursing Assessment / Reassessment X - Reassessment of Co-morbidities (includes updates in patient status) 1 10 X- 1 5 Reassessment of Adherence to Treatment Plan ASSESSMENTS - Wound and Skin Assessment / Reassessment X - Simple Wound Assessment / Reassessment - one wound 1 5 []  - 0 Complex Wound Assessment / Reassessment - multiple wounds []  - 0 Dermatologic / Skin Assessment (not related to wound area) ASSESSMENTS - Focused Assessment []  - Circumferential Edema Measurements - multi extremities 0 []  - 0 Nutritional Assessment / Counseling / Intervention []  - 0 Lower Extremity Assessment (monofilament, tuning fork, pulses) []  - 0 Peripheral Arterial Disease Assessment (using hand held doppler) ASSESSMENTS - Ostomy and/or Continence Assessment and Care []  - Incontinence Assessment and Management 0 []  - 0 Ostomy Care Assessment and Management (repouching, etc.) PROCESS - Coordination of Care X - Simple Patient / Family Education for ongoing care 1 15 []  - 0 Complex (extensive) Patient / Family Education for ongoing care []  - 0 Staff obtains Programmer, systems, Records, Test Results / Process Orders []  - 0 Staff telephones HHA, Nursing Homes / Clarify orders / etc []  - 0 Routine Transfer to another Facility (non-emergent condition) []  - 0 Routine Hospital Admission (non-emergent condition) []  - 0 New Admissions / Biomedical engineer / Ordering NPWT, Apligraf, etc. []  - 0 Emergency Hospital Admission (emergent condition) X- 1 10 Simple Discharge Coordination Bouler, Marcos ([id number]) []  - 0 Complex (extensive) Discharge Coordination PROCESS - Special Needs []  - Pediatric / Minor Patient Management 0 []  - 0 Isolation Patient Management []  - 0 Hearing / Language / Visual special needs []  - 0 Assessment of Community assistance (transportation, D/C planning, etc.) []  - 0 Additional assistance /  Altered mentation []  - 0 Support Surface(s) Assessment (bed, cushion, seat, etc.) INTERVENTIONS - Wound Cleansing / Measurement X - Simple Wound Cleansing - one wound 1 5 []  - 0 Complex Wound Cleansing -  multiple wounds X- 1 5 Wound Imaging (photographs - any number of wounds) []  - 0 Wound Tracing (instead of photographs) []  - 0 Simple Wound Measurement - one wound []  - 0 Complex Wound Measurement - multiple wounds INTERVENTIONS - Wound Dressings []  - Small Wound Dressing one or multiple wounds 0 []  - 0 Medium Wound Dressing one or multiple wounds []  - 0 Large Wound Dressing one or multiple wounds []  - 0 Application of Medications - topical []  - 0 Application of Medications - injection INTERVENTIONS - Miscellaneous []  - External ear exam 0 []  - 0 Specimen Collection (cultures, biopsies, blood, body fluids, etc.) []  - 0 Specimen(s) / Culture(s) sent or taken to Lab for analysis []  - 0 Patient Transfer (multiple staff / Civil Service fast streamer / Similar devices) []  - 0 Simple Staple / Suture removal (25 or less) []  - 0 Complex Staple / Suture removal (26 or more) []  - 0 Hypo / Hyperglycemic Management (close monitor of Blood Glucose) []  - 0 Ankle / Brachial Index (ABI) - do not check if billed separately X- 1 5 Vital Signs [place], Gradyn ([id number]) Has the patient been seen at the hospital within the last three years: Yes Total Score: 60 Level Of Care: New/Established - Level 2 Electronic Signature(s) Signed: 03/25/2017 4:17:47 PM By: Alric Quan Entered By: Alric Quan on 03/23/2017 16:20:04 [name] ([id number]) -------------------------------------------------------------------------------- Encounter Discharge Information Details Patient Name: [name] Date of Service: 03/23/2017 2:30 PM Medical Record Number: [id number] Patient Account Number: [id number] Date of Birth/Sex: 11/27/1970 (47 y.o. Male) Treating RN: Ahmed Prima Primary Care  Nikkie Liming: [name] Other Clinician: Referring Laquenta Whitsell: [name] Treating Jerome Viglione/Extender: Tito Dine in Treatment: 8 Encounter Discharge Information Items Discharge Pain Level: 0 Discharge Condition: Stable Ambulatory Status: Ambulatory Discharge Destination: Home Transportation: Private Auto Accompanied By: self Schedule Follow-up Appointment: No Medication Reconciliation completed and No provided to Patient/Care Cregg Jutte: Provided on Clinical Summary of Care: 03/23/2017 Form Type Recipient Paper Patient AB Electronic Signature(s) Signed: 03/25/2017 4:27:15 PM By: Ruthine Dose Entered By: Ruthine Dose on 03/23/2017 15:19:36 [name] ([id number]) -------------------------------------------------------------------------------- Lower Extremity Assessment Details Patient Name: [name] Date of Service: 03/23/2017 2:30 PM Medical Record Number: [id number] Patient Account Number: [id number] Date of Birth/Sex: [birth year] (47 y.o. Male) Treating RN: Ahmed Prima Primary Care Primrose Oler: [name] Other Clinician: Referring Amarria Andreasen: [name] Treating Ashland Wiseman/Extender: [name] Weeks in Treatment: 8 Vascular Assessment Pulses: Dorsalis Pedis Palpable: [Right:Yes] Posterior Tibial Extremity colors, hair growth, and conditions: Extremity Color: [Right:Normal] Temperature of Extremity: [Right:Warm] Capillary Refill: [Right:< 3 seconds] Toe Nail Assessment Left: Right: Thick: No Discolored: No Deformed: No Improper Length and Hygiene: No Electronic Signature(s) Signed: 03/25/2017 4:17:47 PM By: Alric Quan Entered By: Alric Quan on 03/23/2017 15:00:14 [name] ([id number]) -------------------------------------------------------------------------------- Multi Wound Chart Details Patient Name: [name] Date of Service: 03/23/2017 2:30 PM Medical Record Number: [id number] Patient Account  Number: [id number] Date of Birth/Sex: [birth year] (47 y.o. 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] Comorbid History: [6:Hypertension, Type II Diabetes N/A] Date Acquired: [6:01/10/2017] [N/A:N/A] Weeks of Treatment: [6:8] [N/A:N/A] Wound Status: [6:Open] [N/A:N/A] Measurements L x W  x D [6:0x0x0] [N/A:N/A] (cm) Area (cm) : [6:0] [N/A:N/A] Volume (cm) : [6:0] [N/A:N/A] % Reduction in Area: [6:100.00%] [N/A:N/A] % Reduction in Volume: [6:100.00%] [N/A:N/A] Classification: [6:Grade 2] [N/A:N/A] Exudate Amount: [6:None Present] [N/A:N/A] Wound Margin: [6:Distinct, outline attached] [N/A:N/A] Granulation Amount: [6:None Present (0%)] [N/A:N/A] Necrotic Amount: [6:None Present (0%)] [N/A:N/A] Exposed Structures: [6:Fascia: No Fat Layer (Subcutaneous Tissue) Exposed: No Tendon: No Muscle: No Joint: No Bone: No Limited to Skin Breakdown] [N/A:N/A] Epithelialization: [6:Large (67-100%)] [N/A:N/A] Periwound Skin Texture: [6:Excoriation: No Induration: No Callus: No Crepitus: No Rash: No Scarring: No] [N/A:N/A] Periwound Skin Moisture: [6:Maceration: No Dry/Scaly: No] [N/A:N/A] Periwound Skin Color: Atrophie Blanche: No N/A N/A Cyanosis: No Ecchymosis: No Erythema: No Hemosiderin Staining: No Mottled: No Pallor: No Rubor: No Temperature: No Abnormality N/A N/A Tenderness on Palpation: Yes N/A N/A Wound Preparation: Ulcer Cleansing: N/A  N/A Rinsed/Irrigated with Saline Topical Anesthetic Applied: None Treatment Notes Electronic Signature(s) Signed: 03/23/2017 4:49:25 PM By: Linton Ham MD Entered By: Linton Ham on 03/23/2017 16:20:45 [name] ([id number]) -------------------------------------------------------------------------------- [place] Details Patient Name: [name] Date of Service: 03/23/2017 2:30 PM Medical Record Number: [id number] Patient Account Number: [id number] Date of Birth/Sex: Nov 10, 1970 (47 y.o. Male) Treating RN: Ahmed Prima Primary Care Lashanda Storlie: [name] Other Clinician: Referring Sher Shampine: [name] Treating Amber Guthridge/Extender: [name] Weeks in Treatment: 8 Active Inactive Electronic Signature(s) Signed: 03/25/2017 4:17:47 PM By: Alric Quan Entered By: Alric Quan on 03/23/2017 15:18:23 [name] ([id number]) -------------------------------------------------------------------------------- Pain Assessment Details Patient Name: [name] Date of Service: 03/23/2017 2:30 PM Medical Record Number: [id number] Patient Account Number: [id number] Date of Birth/Sex: 1970/06/07 (47 y.o. Male) Treating RN: Ahmed Prima Primary Care Milton Sagona: [name] Other Clinician: Referring Klani Caridi: [name] Treating Kassandra Meriweather/Extender: [name] Weeks in Treatment: 8 Active Problems Location of Pain Severity and Description of Pain Patient Has Paino Yes Site Locations Pain Location: Pain in Ulcers Rate the pain. Current Pain Level: 7 Character of Pain Describe the Pain: Aching Pain Management and Medication Current Pain Management: Notes ankle pain Electronic Signature(s) Signed: 03/25/2017 4:17:47 PM By: Alric Quan Entered By: Alric Quan on 03/23/2017 14:53:19 [name]  ([id number]) -------------------------------------------------------------------------------- Patient/Caregiver Education Details Patient Name: [name] Date of Service: 03/23/2017 2:30 PM Medical Record Number: [id number] Patient Account Number: [id number] Date of Birth/Gender: [birth year] (47 y.o. Male) Treating RN: Ahmed Prima Primary Care Physician: [name] Other Clinician: Referring Physician: [name] Treating Physician/Extender: Tito Dine in Treatment: 8 Education Assessment Education Provided To: Patient Education Topics Provided Wound/Skin Impairment: Handouts: Other: Please call our office if you have any questions or concerns. Methods: Explain/Verbal Responses: State content correctly Electronic Signature(s) Signed: 03/25/2017 4:17:47 PM By: Alric Quan Entered By: Alric Quan on 03/23/2017 15:18:43 [name] ([id number]) -------------------------------------------------------------------------------- Wound Assessment Details Patient Name: [name] Date of Service: 03/23/2017 2:30 PM Medical Record Number: [id number] Patient Account Number: [id number] Date of Birth/Sex: 06-Feb-1971 (47 y.o. Male) Treating RN: Ahmed Prima Primary Care Darnelle Derrick: [name] Other Clinician: Referring Ansleigh Safer: [name] Treating Kimiyah Blick/Extender: [name] Weeks in Treatment: 8 Wound Status Wound Number: 6 Primary Etiology: Diabetic Wound/Ulcer of the Lower Extremity Wound Location: Right Lower Leg - Anterior Wound Status: Open Wounding Event: Gradually Appeared Comorbid Hypertension, Type II Diabetes Date Acquired: 01/10/2017 History: Weeks Of Treatment: 8 Clustered Wound: No Photos Photo Uploaded By: Alric Quan on 03/23/2017 16:38:46 Wound Measurements Length: (cm) 0 % Re Width: (cm) 0 % Re Depth: (cm) 0 Epit Area: (cm) 0 Tun Volume: (cm) 0 Und duction  in Area: 100% duction in  Volume: 100% helialization: Large (67-100%) neling: No ermining: No Wound Description Classification: Grade 2 Wound Margin: Distinct, outline attached Exudate Amount: None Present Foul Odor After Cleansing: No Slough/Fibrino No Wound Bed Granulation Amount: None Present (0%) Exposed Structure Necrotic Amount: None Present (0%) Fascia Exposed: No Fat Layer (Subcutaneous Tissue) Exposed: No Tendon Exposed: No Muscle Exposed: No Joint Exposed: No Bone Exposed: No Limited to Skin Breakdown Periwound Skin Texture Texture Color Haigh, [name] ([id number]) No Abnormalities Noted: No No Abnormalities Noted: No Callus: No Atrophie Blanche: No Crepitus: No Cyanosis: No Excoriation: No Ecchymosis: No Induration: No Erythema: No Rash: No Hemosiderin Staining: No Scarring: No Mottled: No Pallor: No Moisture Rubor: No No Abnormalities Noted: No Dry / Scaly: No Temperature / Pain Maceration: No Temperature: No Abnormality Tenderness on Palpation: Yes Wound Preparation Ulcer Cleansing: Rinsed/Irrigated with Saline Topical Anesthetic Applied: None Electronic Signature(s) Signed: 03/25/2017 4:17:47 PM By: Alric Quan Entered By: Alric Quan on 03/23/2017 15:15:11 [name] ([id number]) -------------------------------------------------------------------------------- Vitals Details Patient Name: [name] Date of Service: 03/23/2017 2:30 PM Medical Record Number: [id number] Patient Account Number: [id number] Date of Birth/Sex: [birth year] (47 y.o. Male) Treating RN: Carolyne Fiscal, Debi Primary Care Quinesha Selinger: [name] Other Clinician: Referring Datrell Dunton: [name] Treating Jamarii Banks/Extender: [name] Weeks in Treatment: 8 Vital Signs Time Taken: 14:54 Temperature (F): 98.8 Height (in): 75 Pulse (bpm): 93 Weight (lbs): 240.8 Respiratory Rate (breaths/min): 18 Body Mass Index (BMI): 30.1 Blood Pressure (mmHg): 120/75 Reference  Range: 80 - 120 mg / dl Electronic Signature(s) Signed: 03/25/2017 4:17:47 PM By: Alric Quan Entered By: Alric Quan on 03/23/2017 14:55:18

## 2017-03-28 ENCOUNTER — Ambulatory Visit (HOSPITAL_COMMUNITY)
Admission: RE | Admit: 2017-03-28 | Discharge: 2017-03-28 | Disposition: A | Discharge status: Home / Self Care | Payer: BC Managed Care – PPO | Source: Ambulatory Visit | Attending: Nephrology | Admitting: Nephrology

## 2017-03-28 DIAGNOSIS — N183 Chronic kidney disease, stage 3 unspecified: Secondary | ICD-10-CM

## 2017-04-19 ENCOUNTER — Ambulatory Visit (INDEPENDENT_AMBULATORY_CARE_PROVIDER_SITE_OTHER): Payer: BC Managed Care – PPO

## 2017-04-19 ENCOUNTER — Ambulatory Visit: Payer: BC Managed Care – PPO | Admitting: Podiatry

## 2017-04-19 DIAGNOSIS — M79675 Pain in left toe(s): Secondary | ICD-10-CM

## 2017-04-19 DIAGNOSIS — M25571 Pain in right ankle and joints of right foot: Secondary | ICD-10-CM

## 2017-04-19 DIAGNOSIS — S638X9A Sprain of other part of unspecified wrist and hand, initial encounter: Secondary | ICD-10-CM

## 2017-04-19 DIAGNOSIS — M79674 Pain in right toe(s): Secondary | ICD-10-CM

## 2017-04-19 DIAGNOSIS — G8929 Other chronic pain: Secondary | ICD-10-CM | POA: Diagnosis not present

## 2017-04-19 DIAGNOSIS — Z9181 History of falling: Secondary | ICD-10-CM | POA: Diagnosis not present

## 2017-04-19 DIAGNOSIS — R2681 Unsteadiness on feet: Secondary | ICD-10-CM

## 2017-04-19 DIAGNOSIS — E1140 Type 2 diabetes mellitus with diabetic neuropathy, unspecified: Secondary | ICD-10-CM | POA: Diagnosis not present

## 2017-04-19 DIAGNOSIS — E1149 Type 2 diabetes mellitus with other diabetic neurological complication: Secondary | ICD-10-CM | POA: Diagnosis not present

## 2017-04-19 DIAGNOSIS — M25572 Pain in left ankle and joints of left foot: Principal | ICD-10-CM

## 2017-04-19 DIAGNOSIS — B351 Tinea unguium: Secondary | ICD-10-CM

## 2017-04-19 NOTE — Progress Notes (Signed)
Chief Complaint  Patient presents with  . Difficulty Walking    patient reports that he has had several falls the last few months and is concerned it may be due to his ongoing ankle issues   . Nail Problem    bilateral elongated thickened toenails   Subjective: [name] presents the office today for concerns of balance issues.  He gets pain to both of his ankles but he states that his main concern is his balance.  He states that at times he has fallen he has had couple of falls recently but denies any injury to the time of the falls to his lower extremity but he did have a fractured vertebrae because of this.  He also states that when he drives and sometimes he will be forgetful or if he is going somewhere he will be forgetful as well.  He has been seen his primary care physician for this as well.  He denies any increase in swelling to his feet.  He does have neuropathy.  He also states his nails are thick and elongated trimmed today.  He has no other concerns.  He has been going to physical therapy.  Denies any systemic complaints such as fevers, chills, nausea, vomiting. No acute changes since last appointment, and no other complaints at this time.   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  There is no area pinpoint bony tenderness or pain to vibratory sensation however there is diffuse tenderness to both ankles. MMT appears to be 4/5 b/l.  There is no overlying edema, erythema, increase in warmth.  Ankle, subtalar joint range of motion intact. Nails are hypertrophic, dystrophic, brittle, discolored, elongated 10. No surrounding redness or drainage. Tenderness nails 1-5 bilaterally. No open lesions or pre-ulcerative lesions are identified today. No open lesions or pre-ulcerative lesions.  No pain with calf compression, swelling, warmth,  erythema  Assessment: Gait instability with falls ankle pain; type 2 diabetes with neuropathy; symptomatic onychomycosis  Plan: -All treatment options discussed with the patient including all alternatives, risks, complications.  -Nails sharply debrided x9 without any complications or bleeding -He is having issues with his ankles.  X-rays were taken not reveal any evidence of acute fracture.  No significant arthritic changes are present. -There is discussion regards to his balance.  After talking to him it seems like there is other things going on contributing to his balance.  I think that the neuropathy as well as internal rotation is contributing to this however this would not account for his driving issues and his forgetfulness.  I would continue to follow-up with his primary care physician.  Discussed the neurology consult he states his primary care physician is good of place this. -We discussed the Moore balance brace and literature was provided.  I think you be a good candidate as he is a high risk for falls as he had a recent fall within the last year actually injuring the vertebrae.  I will get him in to get casted for this.  Also continue with physical therapy.  Trula Slade DPM  -Patient encouraged to call the office with any questions, concerns, change in symptoms.

## 2017-04-22 NOTE — Addendum Note (Signed)
Addended by: Harriett Sine D on: 04/22/2017 08:53 AM   Modules accepted: Orders

## 2017-05-03 ENCOUNTER — Telehealth: Payer: Self-pay | Admitting: Podiatry

## 2017-05-03 NOTE — Telephone Encounter (Signed)
lvm for pt to call to schedule an appt to see [facility] for balance braces.Marland Kitchen

## 2017-05-04 ENCOUNTER — Telehealth: Payer: Self-pay | Admitting: *Deleted

## 2017-05-04 NOTE — Telephone Encounter (Signed)
Pt left phone number only.

## 2017-05-04 NOTE — Telephone Encounter (Signed)
Left message to call with question or concern and often I would be able to get back with an answer.

## 2017-05-06 NOTE — Telephone Encounter (Signed)
Pt returned call and is scheduled to see Betha 4.2.19

## 2017-05-24 ENCOUNTER — Other Ambulatory Visit: Payer: BC Managed Care – PPO

## 2017-05-27 ENCOUNTER — Other Ambulatory Visit: Payer: Self-pay | Admitting: Neurosurgery

## 2017-05-30 ENCOUNTER — Other Ambulatory Visit: Payer: Self-pay | Admitting: Neurosurgery

## 2017-05-30 DIAGNOSIS — S22088D Other fracture of T11-T12 vertebra, subsequent encounter for fracture with routine healing: Secondary | ICD-10-CM

## 2017-06-06 ENCOUNTER — Ambulatory Visit
Admission: RE | Admit: 2017-06-06 | Discharge: 2017-06-06 | Disposition: A | Discharge status: Home / Self Care | Payer: BC Managed Care – PPO | Source: Ambulatory Visit | Attending: Neurosurgery | Admitting: Neurosurgery

## 2017-06-06 DIAGNOSIS — S22088D Other fracture of T11-T12 vertebra, subsequent encounter for fracture with routine healing: Secondary | ICD-10-CM

## 2017-06-24 ENCOUNTER — Encounter: Payer: Self-pay | Admitting: Podiatry

## 2017-06-24 ENCOUNTER — Ambulatory Visit: Payer: BC Managed Care – PPO | Admitting: Podiatry

## 2017-06-24 DIAGNOSIS — E1140 Type 2 diabetes mellitus with diabetic neuropathy, unspecified: Secondary | ICD-10-CM

## 2017-06-24 DIAGNOSIS — M79675 Pain in left toe(s): Secondary | ICD-10-CM | POA: Diagnosis not present

## 2017-06-24 DIAGNOSIS — B351 Tinea unguium: Secondary | ICD-10-CM | POA: Diagnosis not present

## 2017-06-24 DIAGNOSIS — E1149 Type 2 diabetes mellitus with other diabetic neurological complication: Secondary | ICD-10-CM

## 2017-06-24 DIAGNOSIS — M79674 Pain in right toe(s): Secondary | ICD-10-CM | POA: Diagnosis not present

## 2017-06-26 NOTE — Progress Notes (Signed)
Subjective: 47 y.o. returns the office today for painful, elongated, thickened toenails which he cannot trim himself. Denies any redness or drainage around the nails.  He is continue with physical therapy to help with his balance and he states this is been helping.  He continues to go to physical therapy twice a week as this is been continuing to help but he feels that his stability has been getting better.  He states that he has numbness to both of his feet and legs.  He states his blood sugars are now down to 300s and 400s and has been improving.  Denies any acute changes since last appointment and no new complaints today. Denies any systemic complaints such as fevers, chills, nausea, vomiting.   PCP: [name], MD   Objective: AAO 3, NAD, presents today wearing flip-flops DP/PT pulses palpable, CRT less than 3 seconds Protective sensation decreased with Simms Weinstein monofilament Nails hypertrophic, dystrophic, elongated, brittle, discolored 9. 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  There is no surrounding erythema or drainage along the nail sites. No open lesions or pre-ulcerative lesions are identified. No other areas of tenderness bilateral lower extremities. No overlying edema, erythema, increased warmth. No pain with calf compression, swelling, warmth, erythema.  Assessment: Patient presents with symptomatic onychomycosis, neuropathy, gait instability likely due to neuropathy  Plan: -Treatment options including alternatives, risks, complications were discussed -Nails sharply debrided 9 without complication/bleeding. -Continue with physical therapy.  He is wearing flip flops today I discussed wearing his diabetic shoe.  He is to wear more support to help prevent falls. -Discussed daily foot inspection. If there are any changes, to call the office immediately.  -Follow-up in 3 months or sooner if any problems  are to arise. In the meantime, encouraged to call the office with any questions, concerns, changes symptoms.  Celesta Gentile, DPM

## 2017-07-05 ENCOUNTER — Other Ambulatory Visit: Payer: Self-pay

## 2017-07-05 ENCOUNTER — Encounter: Payer: Self-pay | Admitting: Psychiatry

## 2017-07-05 ENCOUNTER — Ambulatory Visit: Payer: BC Managed Care – PPO | Admitting: Psychiatry

## 2017-07-05 VITALS — BP 146/92 | HR 91 | Temp 98.4°F | Wt 229.0 lb

## 2017-07-05 DIAGNOSIS — F5105 Insomnia due to other mental disorder: Secondary | ICD-10-CM

## 2017-07-05 DIAGNOSIS — F331 Major depressive disorder, recurrent, moderate: Secondary | ICD-10-CM | POA: Diagnosis not present

## 2017-07-05 DIAGNOSIS — E11319 Type 2 diabetes mellitus with unspecified diabetic retinopathy without macular edema: Secondary | ICD-10-CM | POA: Insufficient documentation

## 2017-07-05 MED ORDER — BUPROPION HCL 75 MG PO TABS: 75.0000 mg | ORAL_TABLET | Freq: Every morning | ORAL | 1 refills | Status: DC

## 2017-07-05 MED ORDER — TRAZODONE HCL 50 MG PO TABS: 25.0000 mg | ORAL_TABLET | Freq: Every evening | ORAL | 1 refills | Status: DC | PRN

## 2017-07-05 NOTE — Progress Notes (Signed)
Psychiatric Initial Adult Assessment   Patient Identification: [name]. MRN:  [id number] Date of Evaluation:  07/05/2017 Referral Source: [name] MD Chief Complaint:  ' I am here for depression." Chief Complaint    Establish Care; Anxiety; Depression; Fatigue     Visit Diagnosis:    ICD-10-CM   1. MDD (major depressive disorder), recurrent episode, moderate (HCC) F33.1   2. Insomnia due to mental condition F51.05     History of Present Illness:  [name] is a 47 year old African-American male, married, unemployed, lives in [place] , has a history of depression, diabetes mellitus, SVT, AKI, diabetic retinopathy, hypertension, presented to the clinic today to establish care.  Patient today reports that he has been struggling with depression since the past several years.  Reports worsening sx since the past few months. He reports his depressive symptoms as sadness, anhedonia, inability to focus, lack of motivation, sleep problems and appetite problems.  Patient reports he also has a history of passive suicidal thoughts however he has never actively planned.  Patient denies any suicidality at this time.  Patient does report a history of AH.  He reports sometimes he can hear voices talking to him.  He reports he is able to distract himself and cope with these voices.  He reports at times when he drives he hear the voices asking him to drive a certain way and so on.  He reports he hence does not drive anymore.  He has several psychosocial stressors.  His medical problems are a significant stressor for him.  He reports he is unable to sit still or walk or stand for a certain amount of time since his lower extremities swells up  if he does so.  He has to keep his legs elevated several times a day to help with the same.  He also reports his kidney function as decreasing and he may have to go into dialysis soon.  He reports he had palpitation recently and his doctors are currently treating  him for the same.  He reports he is unable to work due to his medical issues.  He reports he feels sad when he sees his wife works 3 jobs.  He reports he has applied for disability and had hearing on May 1.  He reports he is awaiting to hear back from them.  Patient reports sleep problems.  He reports he has trouble falling asleep and as well as maintaining sleep.  He does report some anxiety symptoms about his health as well as his financial stressors.   He denies any history of trauma.  Denies abusing any drugs or alcohol.    Associated Signs/Symptoms: Depression Symptoms:  depressed mood, anhedonia, difficulty concentrating, anxiety, decreased appetite, (Hypo) Manic Symptoms:  Hallucinations, Anxiety Symptoms:  Excessive Worry, Psychotic Symptoms:  Hallucinations: Auditory PTSD Symptoms: Negative  Past Psychiatric History: He denies any inpatient mental health admissions in the past.  He denies suicide attempts in the past.  He reports his primary medical doctor was treating his depression with citalopram.  He is on 20 mg at this time.  Previous Psychotropic Medications: Yes ,celexa Substance Abuse History in the last 12 months:  No.  Consequences of Substance Abuse: Negative  Past Medical History:  Past Medical History:  Diagnosis Date  . Anxiety   . Cellulitis   . Chronic kidney disease   . Diabetes mellitus without complication ([place])   . Diabetes mellitus, type II ([place])   . Heart palpitations   . HLD (hyperlipidemia)   .  Hypertension     Past Surgical History:  Procedure Laterality Date  . AMPUTATION Left 03/16/2016   Procedure: AMPUTATION DIGIT LEFT HALLUX;  Surgeon: Trula Slade, DPM;  Location: [facility];  Service: Podiatry;  Laterality: Left;  can start around 5   . ARTHROSCOPIC REPAIR ACL Left   . COLONOSCOPY WITH PROPOFOL N/A 10/28/2015   Procedure: COLONOSCOPY WITH PROPOFOL;  Surgeon: Lollie Sails, MD;  Location: [facility] END[facility]COPY;  Service: Endoscopy;   Laterality: N/A;  . COLONOSCOPY WITH PROPOFOL N/A 10/29/2015   Procedure: COLONOSCOPY WITH PROPOFOL;  Surgeon: Lollie Sails, MD;  Location: [facility] ENDOSCOPY;  Service: Endoscopy;  Laterality: N/A;  . PROSTATE SURGERY  2016  . TONSILECTOMY/ADENOIDECTOMY WITH MYRINGOTOMY    . TONSILLECTOMY      Family Psychiatric History: Mother-mental illness  Family History:  Family History  Problem Relation Age of Onset  . CAD Father   . Stroke Father   . Diabetes Mellitus II Mother   . Kidney failure Mother   . Schizophrenia Mother     Social History:   Social History   Socioeconomic History  . Marital status: Married    Spouse name: [name]  . Number of children: 2  . Years of education: Not on file  . Highest education level: High school graduate  Occupational History    Comment: not employed  Social Needs  . Financial resource strain: Hard  . Food insecurity:    Worry: Often true    Inability: Often true  . Transportation needs:    Medical: Yes    Non-medical: Yes  Tobacco Use  . Smoking status: Never Smoker  . Smokeless tobacco: Never Used  Substance and Sexual Activity  . Alcohol use: No  . Drug use: No  . Sexual activity: Yes  Lifestyle  . Physical activity:    Days per week: 0 days    Minutes per session: 0 min  . Stress: Rather much  Relationships  . Social connections:    Talks on phone: Three times a week    Gets together: Once a week    Attends religious service: More than 4 times per year    Active member of club or organization: No    Attends meetings of clubs or organizations: Never    Relationship status: Married  Other Topics Concern  . Not on file  Social History Narrative  . Not on file    Additional Social History: Patient is married. He lives in [place].  He has applied for disability.  He is unemployed.  His wife and him have a good relationship.  Allergies:   Allergies  Allergen Reactions  . Levofloxacin Swelling  . Phenergan [Promethazine  Hcl] Other (See Comments)    cramping  . Malt     Other reaction(s): OTHER  . Soy Allergy Other (See Comments)    Other reaction(s): OTHER    Metabolic Disorder Labs: Lab Results  Component Value Date   HGBA1C 15.6 (H) 03/06/2015   No results found for: PROLACTIN Lab Results  Component Value Date   CHOL 100 06/24/2015   TRIG 94 06/24/2015   HDL 38 (L) 06/24/2015   CHOLHDL 2.6 06/24/2015   VLDL 19 06/24/2015   LDLCALC 43 06/24/2015     Current Medications: Current Outpatient Medications  Medication Sig Dispense Refill  . amLODipine (NORVASC) 5 MG tablet Take 5 mg by mouth daily.    . carvedilol (COREG) 6.25 MG tablet Take 1 tablet (6.25 mg total) by mouth  2 (two) times daily with a meal. 60 tablet 0  . cholestyramine light (PREVALITE) 4 g packet Take by mouth.    . diltiazem (CARDIZEM) 30 MG tablet Take 30 mg by mouth daily.    . fluticasone (FLONASE) 50 MCG/ACT nasal spray Place 1 spray into both nostrils 2 (two) times daily.    . hyoscyamine (LEVSIN, ANASPAZ) 0.125 MG tablet Take by mouth.    . insulin aspart (NOVOLOG) 100 UNIT/ML FlexPen Inject 60 Units into the skin 3 (three) times daily. (Patient taking differently: Inject 140 Units into the skin 3 (three) times daily. ) 15 mL 11  . Insulin Glargine (LANTUS SOLOSTAR) 100 UNIT/ML Solostar Pen Take 65 units nightly    . lisinopril-hydrochlorothiazide (PRINZIDE,ZESTORETIC) 20-25 MG tablet Take 1 tablet by mouth daily.    . metFORMIN (GLUCOPHAGE-XR) 500 MG 24 hr tablet Take 500 mg by mouth 2 (two) times daily.    . pantoprazole (PROTONIX) 40 MG tablet Take by mouth.    . rosuvastatin (CRESTOR) 40 MG tablet Take by mouth.    . tamsulosin (FLOMAX) 0.4 MG CAPS capsule Take by mouth.    . TRESIBA FLEXTOUCH 200 UNIT/ML SOPN Inject 180 Units into the skin daily. Inject up to 150 Units into the skin daily. (Patient taking differently: Inject 250 Units into the skin daily. )    . amLODipine (NORVASC) 10 MG tablet     . buPROPion  (WELLBUTRIN) 75 MG tablet Take 1 tablet (75 mg total) by mouth every morning. 30 tablet 1  . citalopram (CELEXA) 20 MG tablet     . doxycycline (VIBRA-TABS) 100 MG tablet     . traZODone (DESYREL) 50 MG tablet Take 0.5-1 tablets (25-50 mg total) by mouth at bedtime as needed for sleep. 30 tablet 1   No current facility-administered medications for this visit.     Neurologic: Headache: No Seizure: No Paresthesias:No  Musculoskeletal: Strength & Muscle Tone: within normal limits Gait & Station: normal Patient leans: N/A  Psychiatric Specialty Exam: Review of Systems  Psychiatric/Behavioral: Positive for depression. The patient is nervous/anxious and has insomnia.   All other systems reviewed and are negative.   Blood pressure (!) 146/92, pulse 91, temperature 98.4 F (36.9 C), temperature source Oral, weight 229 lb (103.9 kg).Body mass index is 28.62 kg/m.  General Appearance: Casual  Eye Contact:  Fair  Speech:  Clear and Coherent  Volume:  Normal  Mood:  Anxious, Depressed and Dysphoric  Affect:  Appropriate  Thought Process:  Goal Directed and Descriptions of Associations: Intact  Orientation:  Full (Time, Place, and Person)  Thought Content:  Logical hx of AH  Suicidal Thoughts:  No  Homicidal Thoughts:  No  Memory:  Immediate;   Fair Recent;   Fair Remote;   Fair  Judgement:  Fair  Insight:  Fair  Psychomotor Activity:  Normal  Concentration:  Concentration: Fair and Attention Span: Fair  Recall:  [employer] of Knowledge:Fair  Language: Fair  Akathisia:  No  Handed:  Left  AIMS (if indicated):  na  Assets:  Communication Skills Desire for Improvement Housing Social Support  ADL's:  Intact  Cognition: WNL  Sleep: poor    Treatment Plan Summary: Sun is a 47 year old African-American male who has a history of depression and multiple medical problems including diabetes, history of kidney disease, diabetic retinopathy, SVT, hypertension, presented to the  clinic today to establish care.  Patient today reports significant psychosocial stressors of financial problems his own health issues and  so on.  He does not think the citalopram is currently helpful.  Discussed making medication readjustment as well as referral for psychotherapy.  He does have good social support system.  He does have a history of psychosis however is coping with it and is able to distract himself.  Also given the fact that he does have a history of recent SVT discussed with him about the impact of antipsychotic medications on his cardiac health.  Patient will monitor himself.  Plan as noted below Medication management and Plan as noted below  Plan MDD PHQ 9 equals 22 Continue Celexa 20 mg p.o. daily.  Will taper it off once he tolerates the new antidepressant. We will start Wellbutrin 75 mg p.o. daily. We will refer him for CBT with Ms. Peacock.  For insomnia Start trazodone 25-50 mg p.o. nightly as needed Discussed sleep hygiene techniques.  Provided him with handouts.  Discussed with patient to sign a consent to obtain medical records-most recent labs including TSH, lipid panel, hemoglobin A1c, vitamin B12, folate and vitamin D level from his family provider.  If unable to obtain or not ordered recently we will get those done during next visit.  Provided medication education, provided handouts.  Follow-up in clinic in 3-4 weeks or sooner if needed.  More than 50 % of the time was spent for psychoeducation and supportive psychotherapy and care coordination.  This note was generated in part or whole with voice recognition software. Voice recognition is usually quite accurate but there are transcription errors that can and very often do occur. I apologize for any typographical errors that were not detected and corrected.      Ursula Alert, MD 5/14/20191:00 PM

## 2017-07-05 NOTE — Patient Instructions (Signed)
Bupropion tablets (Depression/Mood Disorders) What is this medicine? BUPROPION (byoo PROE pee on) is used to treat depression. This medicine may be used for other purposes; ask your health care provider or pharmacist if you have questions. COMMON BRAND NAME(S): Wellbutrin What should I tell my health care provider before I take this medicine? They need to know if you have any of these conditions: -an eating disorder, such as anorexia or bulimia -bipolar disorder or psychosis -diabetes or high blood sugar, treated with medication -glaucoma -heart disease, previous heart attack, or irregular heart beat -head injury or brain tumor -high blood pressure -kidney or liver disease -seizures -suicidal thoughts or a previous suicide attempt -Tourette's syndrome -weight loss -an unusual or allergic reaction to bupropion, other medicines, foods, dyes, or preservatives -breast-feeding -pregnant or trying to become pregnant How should I use this medicine? Take this medicine by mouth with a glass of water. Follow the directions on the prescription label. You can take it with or without food. If it upsets your stomach, take it with food. Take your medicine at regular intervals. Do not take your medicine more often than directed. Do not stop taking this medicine suddenly except upon the advice of your doctor. Stopping this medicine too quickly may cause serious side effects or your condition may worsen. A special MedGuide will be given to you by the pharmacist with each prescription and refill. Be sure to read this information carefully each time. Talk to your pediatrician regarding the use of this medicine in children. Special care may be needed. Overdosage: If you think you have taken too much of this medicine contact a poison control center or emergency room at once. NOTE: This medicine is only for you. Do not share this medicine with others. What if I miss a dose? If you miss a dose, take it as soon  as you can. If it is less than four hours to your next dose, take only that dose and skip the missed dose. Do not take double or extra doses. What may interact with this medicine? Do not take this medicine with any of the following medications: -linezolid -MAOIs like Azilect, Carbex, Eldepryl, Marplan, Nardil, and Parnate -methylene blue (injected into a vein) -other medicines that contain bupropion like Zyban This medicine may also interact with the following medications: -alcohol -certain medicines for anxiety or sleep -certain medicines for blood pressure like metoprolol, propranolol -certain medicines for depression or psychotic disturbances -certain medicines for HIV or AIDS like efavirenz, lopinavir, nelfinavir, ritonavir -certain medicines for irregular heart beat like propafenone, flecainide -certain medicines for Parkinson's disease like amantadine, levodopa -certain medicines for seizures like carbamazepine, phenytoin, phenobarbital -cimetidine -clopidogrel -cyclophosphamide -digoxin -furazolidone -isoniazid -nicotine -orphenadrine -procarbazine -steroid medicines like prednisone or cortisone -stimulant medicines for attention disorders, weight loss, or to stay awake -tamoxifen -theophylline -thiotepa -ticlopidine -tramadol -warfarin This list may not describe all possible interactions. Give your health care provider a list of all the medicines, herbs, non-prescription drugs, or dietary supplements you use. Also tell them if you smoke, drink alcohol, or use illegal drugs. Some items may interact with your medicine. What should I watch for while using this medicine? Tell your doctor if your symptoms do not get better or if they get worse. Visit your doctor or health care professional for regular checks on your progress. Because it may take several weeks to see the full effects of this medicine, it is important to continue your treatment as prescribed by your  doctor. Patients and their families  should watch out for new or worsening thoughts of suicide or depression. Also watch out for sudden changes in feelings such as feeling anxious, agitated, panicky, irritable, hostile, aggressive, impulsive, severely restless, overly excited and hyperactive, or not being able to sleep. If this happens, especially at the beginning of treatment or after a change in dose, call your health care professional. Avoid alcoholic drinks while taking this medicine. Drinking excessive alcoholic beverages, using sleeping or anxiety medicines, or quickly stopping the use of these agents while taking this medicine may increase your risk for a seizure. Do not drive or use heavy machinery until you know how this medicine affects you. This medicine can impair your ability to perform these tasks. Do not take this medicine close to bedtime. It may prevent you from sleeping. Your mouth may get dry. Chewing sugarless gum or sucking hard candy, and drinking plenty of water may help. Contact your doctor if the problem does not go away or is severe. What side effects may I notice from receiving this medicine? Side effects that you should report to your doctor or health care professional as soon as possible: -allergic reactions like skin rash, itching or hives, swelling of the face, lips, or tongue -breathing problems -changes in vision -confusion -elevated mood, decreased need for sleep, racing thoughts, impulsive behavior -fast or irregular heartbeat -hallucinations, loss of contact with reality -increased blood pressure -redness, blistering, peeling or loosening of the skin, including inside the mouth -seizures -suicidal thoughts or other mood changes -unusually weak or tired -vomiting Side effects that usually do not require medical attention (report to your doctor or health care professional if they continue or are bothersome): -constipation -headache -loss of  appetite -nausea -tremors -weight loss This list may not describe all possible side effects. Call your doctor for medical advice about side effects. You may report side effects to FDA at 1-800-FDA-1088. Where should I keep my medicine? Keep out of the reach of children. Store at room temperature between 20 and 25 degrees C (68 and 77 degrees F), away from direct sunlight and moisture. Keep tightly closed. Throw away any unused medicine after the expiration date. NOTE: This sheet is a summary. It may not cover all possible information. If you have questions about this medicine, talk to your doctor, pharmacist, or health care provider.  2018 [employer] Standard (2015-08-01 13:44:21) Trazodone tablets What is this medicine? TRAZODONE (TRAZ oh done) is used to treat depression. This medicine may be used for other purposes; ask your health care provider or pharmacist if you have questions. COMMON BRAND NAME(S): Desyrel What should I tell my health care provider before I take this medicine? They need to know if you have any of these conditions: -attempted suicide or thinking about it -bipolar disorder -bleeding problems -glaucoma -heart disease, or previous heart attack -irregular heart beat -kidney or liver disease -low levels of sodium in the blood -an unusual or allergic reaction to trazodone, other medicines, foods, dyes or preservatives -pregnant or trying to get pregnant -breast-feeding How should I use this medicine? Take this medicine by mouth with a glass of water. Follow the directions on the prescription label. Take this medicine shortly after a meal or a light snack. Take your medicine at regular intervals. Do not take your medicine more often than directed. Do not stop taking this medicine suddenly except upon the advice of your doctor. Stopping this medicine too quickly may cause serious side effects or your condition may worsen. A special MedGuide will be  given to you by  the pharmacist with each prescription and refill. Be sure to read this information carefully each time. Talk to your pediatrician regarding the use of this medicine in children. Special care may be needed. Overdosage: If you think you have taken too much of this medicine contact a poison control center or emergency room at once. NOTE: This medicine is only for you. Do not share this medicine with others. What if I miss a dose? If you miss a dose, take it as soon as you can. If it is almost time for your next dose, take only that dose. Do not take double or extra doses. What may interact with this medicine? Do not take this medicine with any of the following medications: -certain medicines for fungal infections like fluconazole, itraconazole, ketoconazole, posaconazole, voriconazole -cisapride -dofetilide -dronedarone -linezolid -MAOIs like Carbex, Eldepryl, Marplan, Nardil, and Parnate -mesoridazine -methylene blue (injected into a vein) -pimozide -saquinavir -thioridazine -ziprasidone This medicine may also interact with the following medications: -alcohol -antiviral medicines for HIV or AIDS -aspirin and aspirin-like medicines -barbiturates like phenobarbital -certain medicines for blood pressure, heart disease, irregular heart beat -certain medicines for depression, anxiety, or psychotic disturbances -certain medicines for migraine headache like almotriptan, eletriptan, frovatriptan, naratriptan, rizatriptan, sumatriptan, zolmitriptan -certain medicines for seizures like carbamazepine and phenytoin -certain medicines for sleep -certain medicines that treat or prevent blood clots like dalteparin, enoxaparin, warfarin -digoxin -fentanyl -lithium -NSAIDS, medicines for pain and inflammation, like ibuprofen or naproxen -other medicines that prolong the QT interval (cause an abnormal heart rhythm) -rasagiline -supplements like St. John's wort, kava kava,  valerian -tramadol -tryptophan This list may not describe all possible interactions. Give your health care provider a list of all the medicines, herbs, non-prescription drugs, or dietary supplements you use. Also tell them if you smoke, drink alcohol, or use illegal drugs. Some items may interact with your medicine. What should I watch for while using this medicine? Tell your doctor if your symptoms do not get better or if they get worse. Visit your doctor or health care professional for regular checks on your progress. Because it may take several weeks to see the full effects of this medicine, it is important to continue your treatment as prescribed by your doctor. Patients and their families should watch out for new or worsening thoughts of suicide or depression. Also watch out for sudden changes in feelings such as feeling anxious, agitated, panicky, irritable, hostile, aggressive, impulsive, severely restless, overly excited and hyperactive, or not being able to sleep. If this happens, especially at the beginning of treatment or after a change in dose, call your health care professional. Dennis Bast may get drowsy or dizzy. Do not drive, use machinery, or do anything that needs mental alertness until you know how this medicine affects you. Do not stand or sit up quickly, especially if you are an older patient. This reduces the risk of dizzy or fainting spells. Alcohol may interfere with the effect of this medicine. Avoid alcoholic drinks. This medicine may cause dry eyes and blurred vision. If you wear contact lenses you may feel some discomfort. Lubricating drops may help. See your eye doctor if the problem does not go away or is severe. Your mouth may get dry. Chewing sugarless gum, sucking hard candy and drinking plenty of water may help. Contact your doctor if the problem does not go away or is severe. What side effects may I notice from receiving this medicine? Side effects that you should report to your  doctor or health care professional as soon as possible: -allergic reactions like skin rash, itching or hives, swelling of the face, lips, or tongue -elevated mood, decreased need for sleep, racing thoughts, impulsive behavior -confusion -fast, irregular heartbeat -feeling faint or lightheaded, falls -feeling agitated, angry, or irritable -loss of balance or coordination -painful or prolonged erections -restlessness, pacing, inability to keep still -suicidal thoughts or other mood changes -tremors -trouble sleeping -seizures -unusual bleeding or bruising Side effects that usually do not require medical attention (report to your doctor or health care professional if they continue or are bothersome): -change in sex drive or performance -change in appetite or weight -constipation -headache -muscle aches or pains -nausea This list may not describe all possible side effects. Call your doctor for medical advice about side effects. You may report side effects to FDA at 1-800-FDA-1088. Where should I keep my medicine? Keep out of the reach of children. Store at room temperature between 15 and 30 degrees C (59 to 86 degrees F). Protect from light. Keep container tightly closed. Throw away any unused medicine after the expiration date. NOTE: This sheet is a summary. It may not cover all possible information. If you have questions about this medicine, talk to your doctor, pharmacist, or health care provider.  2018 [employer] Standard (2015-07-10 16:57:05)

## 2017-07-18 ENCOUNTER — Encounter: Payer: Self-pay | Admitting: Emergency Medicine

## 2017-07-18 ENCOUNTER — Other Ambulatory Visit: Payer: Self-pay

## 2017-07-18 ENCOUNTER — Emergency Department: Payer: BC Managed Care – PPO

## 2017-07-18 ENCOUNTER — Observation Stay: Payer: BC Managed Care – PPO

## 2017-07-18 ENCOUNTER — Observation Stay
Admission: EM | Admit: 2017-07-18 | Discharge: 2017-07-19 | Disposition: A | Discharge status: Home / Self Care | Payer: BC Managed Care – PPO | Attending: Internal Medicine | Admitting: Internal Medicine

## 2017-07-18 DIAGNOSIS — R55 Syncope and collapse: Secondary | ICD-10-CM | POA: Diagnosis not present

## 2017-07-18 DIAGNOSIS — R600 Localized edema: Secondary | ICD-10-CM | POA: Insufficient documentation

## 2017-07-18 DIAGNOSIS — X30XXXA Exposure to excessive natural heat, initial encounter: Secondary | ICD-10-CM | POA: Diagnosis not present

## 2017-07-18 DIAGNOSIS — E878 Other disorders of electrolyte and fluid balance, not elsewhere classified: Secondary | ICD-10-CM | POA: Insufficient documentation

## 2017-07-18 DIAGNOSIS — Z7951 Long term (current) use of inhaled steroids: Secondary | ICD-10-CM | POA: Diagnosis not present

## 2017-07-18 DIAGNOSIS — E11319 Type 2 diabetes mellitus with unspecified diabetic retinopathy without macular edema: Secondary | ICD-10-CM | POA: Diagnosis not present

## 2017-07-18 DIAGNOSIS — I129 Hypertensive chronic kidney disease with stage 1 through stage 4 chronic kidney disease, or unspecified chronic kidney disease: Secondary | ICD-10-CM | POA: Diagnosis not present

## 2017-07-18 DIAGNOSIS — Z9641 Presence of insulin pump (external) (internal): Secondary | ICD-10-CM | POA: Insufficient documentation

## 2017-07-18 DIAGNOSIS — E785 Hyperlipidemia, unspecified: Secondary | ICD-10-CM | POA: Insufficient documentation

## 2017-07-18 DIAGNOSIS — I82409 Acute embolism and thrombosis of unspecified deep veins of unspecified lower extremity: Secondary | ICD-10-CM

## 2017-07-18 DIAGNOSIS — E871 Hypo-osmolality and hyponatremia: Secondary | ICD-10-CM | POA: Insufficient documentation

## 2017-07-18 DIAGNOSIS — T675XXA Heat exhaustion, unspecified, initial encounter: Secondary | ICD-10-CM | POA: Diagnosis present

## 2017-07-18 DIAGNOSIS — Z79899 Other long term (current) drug therapy: Secondary | ICD-10-CM | POA: Diagnosis not present

## 2017-07-18 DIAGNOSIS — N183 Chronic kidney disease, stage 3 (moderate): Secondary | ICD-10-CM | POA: Insufficient documentation

## 2017-07-18 DIAGNOSIS — R531 Weakness: Secondary | ICD-10-CM

## 2017-07-18 DIAGNOSIS — Z89412 Acquired absence of left great toe: Secondary | ICD-10-CM | POA: Diagnosis not present

## 2017-07-18 DIAGNOSIS — E1122 Type 2 diabetes mellitus with diabetic chronic kidney disease: Secondary | ICD-10-CM | POA: Insufficient documentation

## 2017-07-18 DIAGNOSIS — Z794 Long term (current) use of insulin: Secondary | ICD-10-CM | POA: Insufficient documentation

## 2017-07-18 DIAGNOSIS — E860 Dehydration: Secondary | ICD-10-CM | POA: Insufficient documentation

## 2017-07-18 DIAGNOSIS — E1165 Type 2 diabetes mellitus with hyperglycemia: Secondary | ICD-10-CM | POA: Insufficient documentation

## 2017-07-18 DIAGNOSIS — R739 Hyperglycemia, unspecified: Secondary | ICD-10-CM

## 2017-07-18 DIAGNOSIS — N179 Acute kidney failure, unspecified: Secondary | ICD-10-CM

## 2017-07-18 LAB — URINALYSIS, COMPLETE (UACMP) WITH MICROSCOPIC
Bilirubin Urine: NEGATIVE
Ketones, ur: NEGATIVE mg/dL
Leukocytes, UA: NEGATIVE
Nitrite: NEGATIVE
Protein, ur: 100 mg/dL — AB
SPECIFIC GRAVITY, URINE: 1.015 (ref 1.005–1.030)
pH: 5 (ref 5.0–8.0)

## 2017-07-18 LAB — GLUCOSE, CAPILLARY
GLUCOSE-CAPILLARY: 436 mg/dL — AB (ref 65–99)
GLUCOSE-CAPILLARY: 364 mg/dL — AB (ref 65–99)
GLUCOSE-CAPILLARY: 404 mg/dL — AB (ref 65–99)
GLUCOSE-CAPILLARY: 338 mg/dL — AB (ref 65–99)
GLUCOSE-CAPILLARY: 331 mg/dL — AB (ref 65–99)
Glucose-Capillary: 467 mg/dL — ABNORMAL HIGH (ref 65–99)

## 2017-07-18 LAB — COMPREHENSIVE METABOLIC PANEL
ALK PHOS: 70 U/L (ref 38–126)
ALT: 21 U/L (ref 17–63)
AST: 21 U/L (ref 15–41)
Albumin: 2.7 g/dL — ABNORMAL LOW (ref 3.5–5.0)
Anion gap: 11 (ref 5–15)
BUN: 44 mg/dL — AB (ref 6–20)
CALCIUM: 8.7 mg/dL — AB (ref 8.9–10.3)
CO2: 26 mmol/L (ref 22–32)
CREATININE: 2.48 mg/dL — AB (ref 0.61–1.24)
Chloride: 96 mmol/L — ABNORMAL LOW (ref 101–111)
GFR, EST AFRICAN AMERICAN: 33 mL/min — AB (ref >60)
GFR, EST NON AFRICAN AMERICAN: 29 mL/min — AB (ref >60)
Glucose, Bld: 488 mg/dL — ABNORMAL HIGH (ref 65–99)
Potassium: 4.1 mmol/L (ref 3.5–5.1)
SODIUM: 133 mmol/L — AB (ref 135–145)
Total Bilirubin: 0.8 mg/dL (ref 0.3–1.2)
Total Protein: 6.4 g/dL — ABNORMAL LOW (ref 6.5–8.1)

## 2017-07-18 LAB — CBC
HCT: 33.7 % — ABNORMAL LOW (ref 40.0–52.0)
Hemoglobin: 11 g/dL — ABNORMAL LOW (ref 13.0–18.0)
MCH: 23 pg — AB (ref 26.0–34.0)
MCHC: 32.5 g/dL (ref 32.0–36.0)
MCV: 70.8 fL — ABNORMAL LOW (ref 80.0–100.0)
PLATELETS: 249 10*3/uL (ref 150–440)
RBC: 4.77 MIL/uL (ref 4.40–5.90)
RDW: 14.3 % (ref 11.5–14.5)
WBC: 8.3 10*3/uL (ref 3.8–10.6)

## 2017-07-18 LAB — BASIC METABOLIC PANEL
Anion gap: 8 (ref 5–15)
BUN: 40 mg/dL — AB (ref 6–20)
CALCIUM: 8.5 mg/dL — AB (ref 8.9–10.3)
CO2: 26 mmol/L (ref 22–32)
CREATININE: 2.36 mg/dL — AB (ref 0.61–1.24)
Chloride: 99 mmol/L — ABNORMAL LOW (ref 101–111)
GFR calc Af Amer: 36 mL/min — ABNORMAL LOW (ref >60)
GFR calc non Af Amer: 31 mL/min — ABNORMAL LOW (ref >60)
Glucose, Bld: 432 mg/dL — ABNORMAL HIGH (ref 65–99)
Potassium: 4.1 mmol/L (ref 3.5–5.1)
SODIUM: 133 mmol/L — AB (ref 135–145)

## 2017-07-18 LAB — AMMONIA: Ammonia: 20 umol/L (ref 9–35)

## 2017-07-18 LAB — TROPONIN I: Troponin I: <0.03 ng/mL (ref <0.03)

## 2017-07-18 LAB — CK: Total CK: 425 U/L — ABNORMAL HIGH (ref 49–397)

## 2017-07-18 LAB — ETHANOL: Alcohol, Ethyl (B): <10 mg/dL (ref <10)

## 2017-07-18 LAB — BRAIN NATRIURETIC PEPTIDE: B Natriuretic Peptide: 12 pg/mL (ref 0.0–100.0)

## 2017-07-18 MED ORDER — INSULIN ASPART 100 UNIT/ML FLEXPEN: 60.0000 [IU] | PEN_INJECTOR | Freq: Three times a day (TID) | SUBCUTANEOUS | Status: DC

## 2017-07-18 MED ORDER — ONDANSETRON HCL 4 MG PO TABS
4.0000 mg | ORAL_TABLET | Freq: Four times a day (QID) | ORAL | Status: DC | PRN
  Filled 2017-07-18: qty 1

## 2017-07-18 MED ORDER — CARVEDILOL 12.5 MG PO TABS
12.5000 mg | ORAL_TABLET | Freq: Two times a day (BID) | ORAL | Status: DC
  Administered 2017-07-19: 12.5 mg via ORAL
  Filled 2017-07-18: qty 1

## 2017-07-18 MED ORDER — ROSUVASTATIN CALCIUM 10 MG PO TABS
40.0000 mg | ORAL_TABLET | Freq: Every day | ORAL | Status: DC
  Administered 2017-07-19: 40 mg via ORAL
  Filled 2017-07-18 (×2): qty 4

## 2017-07-18 MED ORDER — MORPHINE SULFATE (PF) 2 MG/ML IV SOLN
2.0000 mg | Freq: Once | INTRAVENOUS | Status: AC
  Administered 2017-07-18: 2 mg via INTRAVENOUS
  Filled 2017-07-18: qty 1

## 2017-07-18 MED ORDER — ONDANSETRON HCL 4 MG/2ML IJ SOLN
4.0000 mg | Freq: Four times a day (QID) | INTRAMUSCULAR | Status: DC | PRN
  Administered 2017-07-18: 4 mg via INTRAVENOUS
  Filled 2017-07-18: qty 2

## 2017-07-18 MED ORDER — BUPROPION HCL 75 MG PO TABS
75.0000 mg | ORAL_TABLET | Freq: Every morning | ORAL | Status: DC
  Administered 2017-07-19: 75 mg via ORAL
  Filled 2017-07-18: qty 1

## 2017-07-18 MED ORDER — CHOLESTYRAMINE LIGHT 4 G PO PACK
4.0000 g | PACK | Freq: Every day | ORAL | Status: DC | PRN
  Filled 2017-07-18: qty 1

## 2017-07-18 MED ORDER — SODIUM CHLORIDE 0.9 % IV BOLUS
1000.0000 mL | Freq: Once | INTRAVENOUS | Status: AC
  Administered 2017-07-18: 1000 mL via INTRAVENOUS

## 2017-07-18 MED ORDER — HYDRALAZINE HCL 20 MG/ML IJ SOLN
10.0000 mg | INTRAMUSCULAR | Status: DC | PRN
  Administered 2017-07-18: 10 mg via INTRAVENOUS
  Filled 2017-07-18: qty 1

## 2017-07-18 MED ORDER — FLUTICASONE PROPIONATE 50 MCG/ACT NA SUSP
1.0000 | Freq: Every day | NASAL | Status: DC | PRN
  Filled 2017-07-18: qty 16

## 2017-07-18 MED ORDER — TAMSULOSIN HCL 0.4 MG PO CAPS
0.4000 mg | ORAL_CAPSULE | Freq: Every day | ORAL | Status: DC
  Administered 2017-07-19: 0.4 mg via ORAL
  Filled 2017-07-18: qty 1

## 2017-07-18 MED ORDER — CITALOPRAM HYDROBROMIDE 20 MG PO TABS
20.0000 mg | ORAL_TABLET | Freq: Every day | ORAL | Status: DC
  Administered 2017-07-19: 20 mg via ORAL
  Filled 2017-07-18: qty 1

## 2017-07-18 MED ORDER — ACETAMINOPHEN 650 MG RE SUPP: 650.0000 mg | Freq: Four times a day (QID) | RECTAL | Status: DC | PRN

## 2017-07-18 MED ORDER — INSULIN GLARGINE 100 UNIT/ML ~~LOC~~ SOLN
40.0000 [IU] | Freq: Every day | SUBCUTANEOUS | Status: DC
  Administered 2017-07-18: 40 [IU] via SUBCUTANEOUS
  Filled 2017-07-18 (×2): qty 0.4

## 2017-07-18 MED ORDER — ENOXAPARIN SODIUM 300 MG/3ML IJ SOLN
1.5000 mg/kg | INTRAMUSCULAR | Status: DC
  Administered 2017-07-18: 153 mg via SUBCUTANEOUS
  Filled 2017-07-18 (×2): qty 1.53

## 2017-07-18 MED ORDER — INSULIN ASPART 100 UNIT/ML ~~LOC~~ SOLN
0.0000 [IU] | Freq: Every day | SUBCUTANEOUS | Status: DC
  Administered 2017-07-18: 4 [IU] via SUBCUTANEOUS
  Filled 2017-07-18: qty 1

## 2017-07-18 MED ORDER — DILTIAZEM HCL 30 MG PO TABS
30.0000 mg | ORAL_TABLET | Freq: Every day | ORAL | Status: DC
  Administered 2017-07-18 – 2017-07-19 (×2): 30 mg via ORAL
  Filled 2017-07-18 (×2): qty 1

## 2017-07-18 MED ORDER — TRAZODONE HCL 50 MG PO TABS: 25.0000 mg | ORAL_TABLET | Freq: Every evening | ORAL | Status: DC | PRN

## 2017-07-18 MED ORDER — FLORANEX PO PACK
1.0000 g | PACK | Freq: Three times a day (TID) | ORAL | Status: DC
  Administered 2017-07-19: 1 g via ORAL
  Filled 2017-07-18 (×3): qty 1

## 2017-07-18 MED ORDER — IOPAMIDOL (ISOVUE-300) INJECTION 61%
30.0000 mL | Freq: Once | INTRAVENOUS | Status: AC | PRN
Start: 2017-07-18 — End: 2017-07-18
  Administered 2017-07-18: 30 mL via ORAL

## 2017-07-18 MED ORDER — SODIUM CHLORIDE 0.9 % IV SOLN
INTRAVENOUS | Status: DC
  Administered 2017-07-18: 75 mL/h via INTRAVENOUS

## 2017-07-18 MED ORDER — INSULIN ASPART 100 UNIT/ML ~~LOC~~ SOLN
0.0000 [IU] | Freq: Three times a day (TID) | SUBCUTANEOUS | Status: DC
  Administered 2017-07-19: 11 [IU] via SUBCUTANEOUS
  Administered 2017-07-19: 3 [IU] via SUBCUTANEOUS
  Filled 2017-07-18 (×2): qty 1

## 2017-07-18 MED ORDER — INSULIN ASPART 100 UNIT/ML ~~LOC~~ SOLN
4.0000 [IU] | Freq: Once | SUBCUTANEOUS | Status: AC
  Administered 2017-07-18: 4 [IU] via INTRAVENOUS
  Filled 2017-07-18: qty 1

## 2017-07-18 MED ORDER — ONDANSETRON HCL 4 MG/2ML IJ SOLN
4.0000 mg | Freq: Once | INTRAMUSCULAR | Status: AC
  Administered 2017-07-18: 4 mg via INTRAVENOUS
  Filled 2017-07-18: qty 2

## 2017-07-18 MED ORDER — ACETAMINOPHEN 325 MG PO TABS: 650.0000 mg | ORAL_TABLET | Freq: Four times a day (QID) | ORAL | Status: DC | PRN
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
## 2017-07-18 NOTE — ED Notes (Signed)
Patient transported to CT 

## 2017-07-18 NOTE — ED Notes (Signed)
[place] completed at this time, pt being transferred to floor

## 2017-07-18 NOTE — ED Notes (Signed)
cbg 436

## 2017-07-18 NOTE — ED Triage Notes (Addendum)
FIRST NURSE NOTE-family brought pt in reporting he just "blacked out" and has had high blood sugars today.  Pulled for check in and will check blood sugar. alert

## 2017-07-18 NOTE — ED Triage Notes (Signed)
Syncopal episode at home after getting home from [facility] approx 1430 today. Now weak in chair continually falling asleep while being spoken to.

## 2017-07-18 NOTE — H&P (Signed)
[facility] [place] NAME: [name]    MR#:  [id number]  DATE OF BIRTH:  [birth year]  DATE OF ADMISSION:  07/18/2017  PRIMARY CARE PHYSICIAN: [name], MD   REQUESTING/REFERRING PHYSICIAN:   CHIEF COMPLAINT:   Chief Complaint  Patient presents with  . Weakness    HISTORY OF PRESENT ILLNESS: [name]  is a 47 y.o. male with a known history per below, chronic diarrhea of over 1 year-most likely secondary to medication side effect, presents with acute blackout/loss of consciousness after 3-hour car ride in an unconditioned car, after the car ride, patient was helping his son doing some word lifting a lot more, blacked out in the heat, patient was noted to be lethargic on presentation to the emergency room, blood sugar noted to be elevated, in the emergency room blood sugar was in the 400 range, creatinine 2.3 with baseline between 1.5-2, sodium 133, chloride 99, UA negative, CT head/abdomen unimpressive, EKG benign, patient evaluated emergency room, no apparent distress, resting comfortably in bed, patient with bilateral lower extremity edema, patient denies any pain/shortness of breath, patient is now been admitted for acute probable heat exhaustion, acute hyperglycemia with uncontrolled diabetes mellitus type 2, and acute bilateral lower extremity edema after long car ride.  PAST MEDICAL HISTORY:   Past Medical History:  Diagnosis Date  . Anxiety   . Cellulitis   . Chronic kidney disease   . Diabetes mellitus without complication ([place])   . Diabetes mellitus, type II ([place])   . Heart palpitations   . HLD (hyperlipidemia)   . Hypertension     PAST SURGICAL HISTORY:  Past Surgical History:  Procedure Laterality Date  . AMPUTATION Left 03/16/2016   Procedure: AMPUTATION DIGIT LEFT HALLUX;  Surgeon: Trula Slade, DPM;  Location: [facility];  Service: Podiatry;  Laterality: Left;  can start around 5   . ARTHROSCOPIC REPAIR ACL  Left   . COLONOSCOPY WITH PROPOFOL N/A 10/28/2015   Procedure: COLONOSCOPY WITH PROPOFOL;  Surgeon: Lollie Sails, MD;  Location[facility] ENDOSCOPY;  Service: Endoscopy;  Laterality: N/A;  . COLONOSCOPY WITH PROPOFOL N/A 10/29/2015   Procedure: COLONOSCOPY WITH PROPOFOL;  Surgeon: Lollie Sails, MD;  Location: [facility] ENDOSCOPY;  Service: Endoscopy;  Laterality: N/A;  . PROSTATE SURGERY  2016  . TONSILECTOMY/ADENOIDECTOMY WITH MYRINGOTOMY    . TONSILLECTOMY      SOCIAL HISTORY:  Social History   Tobacco Use  . Smoking status: Never Smoker  . Smokeless tobacco: Never Used  Substance Use Topics  . Alcohol use: No    FAMILY HISTORY:  Family History  Problem Relation Age of Onset  . CAD Father   . Stroke Father   . Diabetes Mellitus II Mother   . Kidney failure Mother   . Schizophrenia Mother     DRUG ALLERGIES:  Allergies  Allergen Reactions  . Levofloxacin Swelling  . Phenergan [Promethazine Hcl] Other (See Comments)    cramping  . Malt     Other reaction(s): OTHER  . Soy Allergy Other (See Comments)    Other reaction(s): OTHER    REVIEW OF SYSTEMS:   CONSTITUTIONAL: No fever, +fatigue, weakness.  EYES: No blurred or double vision.  EARS, NOSE, AND THROAT: No tinnitus or ear pain.  RESPIRATORY: No cough, shortness of breath, wheezing or hemoptysis.  CARDIOVASCULAR: No chest pain, orthopnea, edema.  GASTROINTESTINAL: + nausea, vomiting, diarrhea, abdominal pain.  GENITOURINARY: No dysuria, hematuria.  ENDOCRINE: No polyuria, nocturia,  HEMATOLOGY: No anemia, easy bruising or bleeding SKIN: No rash or lesion. MUSCULOSKELETAL: No joint pain or arthritis.   NEUROLOGIC: No tingling, numbness, weakness.  PSYCHIATRY: No anxiety or depression.   MEDICATIONS AT HOME:  Prior to Admission medications   Medication Sig Start Date End Date Taking? Authorizing Provider  buPROPion (WELLBUTRIN) 75 MG tablet Take 1 tablet (75 mg total) by mouth every morning. 07/05/17  Yes  Ursula Alert, MD  carvedilol (COREG) 6.25 MG tablet Take 1 tablet (6.25 mg total) by mouth 2 (two) times daily with a meal. 06/28/15  Yes Bonnell Public, MD  cholestyramine light (PREVALITE) 4 g packet Take 4 g by mouth daily as needed (digestion).  11/17/15  Yes [provider]  citalopram (CELEXA) 20 MG tablet Take 20 mg by mouth daily.  06/16/17  Yes [provider]  fluticasone (FLONASE) 50 MCG/ACT nasal spray Place 1 spray into both nostrils daily as needed for allergies.  10/06/16  Yes [provider]  insulin aspart (NOVOLOG) 100 UNIT/ML FlexPen Inject 60 Units into the skin 3 (three) times daily. Patient taking differently: Inject 0-140 Units into the skin continuous. Insulin pump 08/19/16 08/19/17 Yes Mody, Sital, MD  lisinopril-hydrochlorothiazide (PRINZIDE,ZESTORETIC) 20-25 MG tablet Take 1 tablet by mouth daily. 05/29/16  Yes [provider]  metFORMIN (GLUCOPHAGE) 500 MG tablet Take 1,000 mg by mouth 2 (two) times daily with a meal.   Yes [provider]  rosuvastatin (CRESTOR) 40 MG tablet Take 40 mg by mouth daily.  05/26/17  Yes [provider]  tamsulosin (FLOMAX) 0.4 MG CAPS capsule Take 0.4 mg by mouth daily after breakfast.    Yes [provider]  traZODone (DESYREL) 50 MG tablet Take 0.5-1 tablets (25-50 mg total) by mouth at bedtime as needed for sleep. 07/05/17  Yes Ursula Alert, MD  amLODipine (NORVASC) 10 MG tablet  04/04/17   [provider]  amLODipine (NORVASC) 5 MG tablet Take 5 mg by mouth daily. 05/26/16   [provider]  diltiazem (CARDIZEM) 30 MG tablet Take 30 mg by mouth daily.    [provider]  metFORMIN (GLUCOPHAGE-XR) 500 MG 24 hr tablet Take 1,000 mg by mouth 2 (two) times daily.  04/20/16   [provider]      PHYSICAL EXAMINATION:   VITAL SIGNS: Blood pressure (!) 164/95, pulse 88, temperature 98.3 F (36.8 C), temperature source Oral, resp. rate 16, height  6\' 3"  (1.905 m), weight 102.1 kg (225 lb), SpO2 99 %.  GENERAL:  47 y.o.-year-old patient lying in the bed with no acute distress.  EYES: Pupils equal, round, reactive to light and accommodation. No scleral icterus. Extraocular muscles intact.  HEENT: Head atraumatic, normocephalic. Oropharynx and nasopharynx clear.  NECK:  Supple, no jugular venous distention. No thyroid enlargement, no tenderness.  LUNGS: Normal breath sounds bilaterally, no wheezing, rales,rhonchi or crepitation. No use of accessory muscles of respiration.  CARDIOVASCULAR: S1, S2 normal. No murmurs, rubs, or gallops.  ABDOMEN: Soft, nontender, nondistended. Bowel sounds present. No organomegaly or mass.  EXTREMITIES: Bilateral lower extremity pitting edema, no cyanosis, or clubbing.  NEUROLOGIC: Cranial nerves II through XII are intact. Muscle strength 5/5 in all extremities. Sensation intact. Gait not checked.  PSYCHIATRIC: The patient is alert and oriented x 3.  SKIN: No obvious rash, lesion, or ulcer.   LABORATORY PANEL:   CBC Recent Labs  Lab 07/18/17 1652  WBC 8.3  HGB 11.0*  HCT 33.7*  PLT 249  MCV 70.8[facility]  23.0*  MCHC 32.5  RDW 14.3   ------------------------------------------------------------------------------------------------------------------  Chemistries  Recent Labs  Lab 07/18/17 1652 07/18/17 1758  NA 133* 133*  K 4.1 4.1  CL 96* 99*  CO2 26 26  GLUCOSE 488* 432*  BUN 44* 40*  CREATININE 2.48* 2.36*  CALCIUM 8.7* 8.5*  AST 21  --   ALT 21  --   ALKPHOS 70  --   BILITOT 0.8  --    ------------------------------------------------------------------------------------------------------------------ estimated creatinine clearance is 49 mL/min (A) (by C-G formula based on SCr of 2.36 mg/dL (H)). ------------------------------------------------------------------------------------------------------------------ No results for input(s): TSH, T4TOTAL, T3FREE, THYROIDAB in the last 72  hours.  Invalid input(s): FREET3   Coagulation profile No results for input(s): INR, PROTIME in the last 168 hours. ------------------------------------------------------------------------------------------------------------------- No results for input(s): DDIMER in the last 72 hours. -------------------------------------------------------------------------------------------------------------------  Cardiac Enzymes Recent Labs  Lab 07/18/17 1652  TROPONINI <0.03   ------------------------------------------------------------------------------------------------------------------ Invalid input(s): POCBNP  ---------------------------------------------------------------------------------------------------------------  Urinalysis    Component Value Date/Time   COLORURINE STRAW (A) 07/18/2017 1652   APPEARANCEUR CLEAR (A) 07/18/2017 1652   LABSPEC 1.015 07/18/2017 1652   PHURINE 5.0 07/18/2017 1652   GLUCOSEU >=500 (A) 07/18/2017 1652   HGBUR SMALL (A) 07/18/2017 1652   BILIRUBINUR NEGATIVE 07/18/2017 1652   KETONESUR NEGATIVE 07/18/2017 1652   PROTEINUR 100 (A) 07/18/2017 1652   NITRITE NEGATIVE 07/18/2017 1652   LEUKOCYTESUR NEGATIVE 07/18/2017 1652     RADIOLOGY: Ct Abdomen Pelvis Wo Contrast  Result Date: 07/18/2017 CLINICAL DATA:  Per family, pt passed out today after being in a hot car. C/o blurred vision. Also c/o stomach cramping with nausea. Has had difficulty digesting food x 2 weeks. EXAM: CT ABDOMEN AND PELVIS WITHOUT CONTRAST TECHNIQUE: Multidetector CT imaging of the abdomen and pelvis was performed following the standard protocol without IV contrast. COMPARISON:  None. FINDINGS: Lower chest: No acute abnormality. Hepatobiliary: No focal liver abnormality is seen. No gallstones, gallbladder wall thickening, or biliary dilatation. Pancreas: Unremarkable. No pancreatic ductal dilatation or surrounding inflammatory changes. Spleen: Normal in size without focal  abnormality. Adrenals/Urinary Tract: Adrenal glands are unremarkable. Kidneys are normal, without renal calculi, focal lesion, or hydronephrosis. Bladder is unremarkable. Stomach/Bowel: Stomach is within normal limits. Appendix appears normal. No evidence of bowel wall thickening, distention, or inflammatory changes. Vascular/Lymphatic: No significant vascular findings are present. No enlarged abdominal or pelvic lymph nodes. Reproductive: Prostate is unremarkable. Other: No abdominal wall hernia or abnormality. No abdominopelvic ascites. Musculoskeletal: No acute or significant osseous findings. IMPRESSION: No acute abdominal or pelvic pathology. Electronically Signed   By: Kathreen Devoid   On: 07/18/2017 18:49   Ct Head Wo Contrast  Result Date: 07/18/2017 CLINICAL DATA:  Syncopal episodes. EXAM: CT HEAD WITHOUT CONTRAST TECHNIQUE: Contiguous axial images were obtained from the base of the skull through the vertex without intravenous contrast. COMPARISON:  Brain MRI 09/30/2016 FINDINGS: Brain: No evidence of acute infarction, hemorrhage, hydrocephalus, extra-axial collection or mass lesion/mass effect. Vascular: No hyperdense vessel or unexpected calcification. Skull: Normal. Negative for fracture or focal lesion. Sinuses/Orbits: No acute finding. Other: None. IMPRESSION: No acute intracranial abnormality. Electronically Signed   By: Fidela Salisbury M.D.   On: 07/18/2017 18:41   Dg Chest Portable 1 View  Result Date: 07/18/2017 CLINICAL DATA:  Syncopal episode. EXAM: PORTABLE CHEST 1 VIEW COMPARISON:  12/04/2007 FINDINGS: Cardiomediastinal silhouette is normal. Mediastinal contours appear intact. There is no evidence of focal airspace consolidation, pleural effusion or pneumothorax. Low lung volumes. Osseous structures are without acute abnormality.  Soft tissues are grossly normal. IMPRESSION: No active disease. Electronically Signed   By: Fidela Salisbury M.D.   On: 07/18/2017 18:05     EKG: Orders placed or performed during the hospital encounter of 07/18/17  . ED EKG  . ED EKG  . EKG 12-Lead  . EKG 12-Lead    IMPRESSION AND PLAN: *Acute probable heat exhaustion After 3-1/2-hour long car ride in an condition vehicle For further observation unit, IV fluids for rehydration, neurochecks per routine, fall precautions, check ammonia level, RPR, urine drug screen  *Acute syncopal episode  most likely secondary to above Check echocardiogram, carotid Dopplers, fall precautions, rule out acute coronary syndrome with cardiac enzymes x3 sets, check urine drug screen, ammonia level, RPR  *Acute bilateral lower extremity edema Occurring after a long car ride Therapeutic Lovenox for now, check lower extremity Dopplers to evaluate for possible DVT  *Chronic benign essential hypertension Discontinue Norvasc given lower extremity edema, continue other antihypertensive agents, PRN hydralazine for systolic blood pressure greater than 160, vitals per routine, make changes as per necessary  *Acute hyperglycemia with diabetes mellitus type 2 Discontinue metformin given chronic kidney disease stage III, continue mealtime insulin, sliding scale insulin with active checks per routine, Lantus at bedtime, IV fluids for rehydration, ADA diet  *Acute hyponatremia/hypochloremia Secondary to dehydration Hold diuretic medication, gentle IV fluids for rehydration, BMP in the morning   All the records are reviewed and case discussed with ED provider. Management plans discussed with the patient, family and they are in agreement.  CODE STATUS:full Code Status History    Date Active Date Inactive Code Status Order ID Comments User Context   08/18/2016 2314 08/19/2016 1901 Full Code [id number]  Vaughan Basta, MD Inpatient   07/09/2016 0141 07/09/2016 1759 Full Code [id number]  Vaughan Basta, MD ED   03/15/2016 2207 03/17/2016 1753 Full Code [id number]  Etta Quill, DO ED    06/23/2015 2139 06/28/2015 1733 Full Code [id number]  Ivor Costa, MD ED   03/07/2015 0133 03/10/2015 2107 Full Code [id number]  Harrie Foreman, MD Inpatient       TOTAL TIME TAKING CARE OF THIS PATIENT: 45 minutes.    Avel Peace Jacolby Risby M.D on 07/18/2017   Between 7am to 6pm - Pager - [phone]  After 6pm go to [email or web address] - password [email or web address] [facility]  Office  [phone]  CC: Primary care physician; [name], MD   Note: This dictation was prepared with Dragon dictation along with smaller phrase technology. Any transcriptional errors that result from this process are unintentional.

## 2017-07-18 NOTE — ED Notes (Signed)
[place] in rm at this time

## 2017-07-18 NOTE — ED Notes (Signed)
ED Provider at bedside. 

## 2017-07-19 ENCOUNTER — Other Ambulatory Visit: Payer: Self-pay

## 2017-07-19 ENCOUNTER — Observation Stay
Admit: 2017-07-19 | Discharge: 2017-07-19 | Disposition: A | Discharge status: Home / Self Care | Payer: BC Managed Care – PPO | Attending: Family Medicine | Admitting: Family Medicine

## 2017-07-19 DIAGNOSIS — I509 Heart failure, unspecified: Secondary | ICD-10-CM

## 2017-07-19 DIAGNOSIS — I5032 Chronic diastolic (congestive) heart failure: Secondary | ICD-10-CM

## 2017-07-19 HISTORY — DX: Heart failure, unspecified: I50.9

## 2017-07-19 HISTORY — DX: Chronic diastolic (congestive) heart failure: I50.32

## 2017-07-19 LAB — URINE DRUG SCREEN, QUALITATIVE (ARMC ONLY)
AMPHETAMINES, UR SCREEN: NOT DETECTED
Barbiturates, Ur Screen: NOT DETECTED
Benzodiazepine, Ur Scrn: NOT DETECTED
CANNABINOID 50 NG, UR ~~LOC~~: NOT DETECTED
Cocaine Metabolite,Ur ~~LOC~~: NOT DETECTED
MDMA (Ecstasy)Ur Screen: NOT DETECTED
Methadone Scn, Ur: NOT DETECTED
Opiate, Ur Screen: NOT DETECTED
Phencyclidine (PCP) Ur S: NOT DETECTED
TRICYCLIC, UR SCREEN: NOT DETECTED

## 2017-07-19 LAB — HEMOGLOBIN A1C
Hgb A1c MFr Bld: 15 % — ABNORMAL HIGH (ref 4.8–5.6)
MEAN PLASMA GLUCOSE: 383.8 mg/dL

## 2017-07-19 LAB — BASIC METABOLIC PANEL
ANION GAP: 6 (ref 5–15)
BUN: 32 mg/dL — ABNORMAL HIGH (ref 6–20)
CALCIUM: 8.3 mg/dL — AB (ref 8.9–10.3)
CO2: 27 mmol/L (ref 22–32)
Chloride: 104 mmol/L (ref 101–111)
Creatinine, Ser: 1.93 mg/dL — ABNORMAL HIGH (ref 0.61–1.24)
GFR calc non Af Amer: 39 mL/min — ABNORMAL LOW (ref >60)
GFR, EST AFRICAN AMERICAN: 45 mL/min — AB (ref >60)
GLUCOSE: 252 mg/dL — AB (ref 65–99)
Potassium: 3.6 mmol/L (ref 3.5–5.1)
Sodium: 137 mmol/L (ref 135–145)

## 2017-07-19 LAB — GLUCOSE, CAPILLARY
Glucose-Capillary: 238 mg/dL — ABNORMAL HIGH (ref 65–99)
Glucose-Capillary: 140 mg/dL — ABNORMAL HIGH (ref 65–99)

## 2017-07-19 LAB — TROPONIN I
Troponin I: <0.03 ng/mL (ref <0.03)
Troponin I: <0.03 ng/mL (ref <0.03)

## 2017-07-19 LAB — ECHOCARDIOGRAM COMPLETE
Height: 75 in
Weight: 3684.8 oz

## 2017-07-19 MED ORDER — PROCHLORPERAZINE EDISYLATE 10 MG/2ML IJ SOLN
10.0000 mg | Freq: Once | INTRAMUSCULAR | Status: AC
  Administered 2017-07-19: 10 mg via INTRAVENOUS
  Filled 2017-07-19: qty 2

## 2017-07-19 MED ORDER — ENOXAPARIN SODIUM 300 MG/3ML IJ SOLN
155.0000 mg | INTRAMUSCULAR | Status: DC
  Filled 2017-07-19: qty 1.55

## 2017-07-19 NOTE — Plan of Care (Signed)
  Problem: Education: Goal: Knowledge of General Education information will improve Outcome: Adequate for Discharge   Problem: Health Behavior/Discharge Planning: Goal: Ability to manage health-related needs will improve Outcome: Adequate for Discharge   Problem: Clinical Measurements: Goal: Ability to maintain clinical measurements within normal limits will improve Outcome: Adequate for Discharge Goal: Will remain free from infection Outcome: Adequate for Discharge Goal: Diagnostic test results will improve Outcome: Adequate for Discharge Goal: Respiratory complications will improve Outcome: Adequate for Discharge Goal: Cardiovascular complication will be avoided Outcome: Adequate for Discharge   Problem: Activity: Goal: Risk for activity intolerance will decrease Outcome: Adequate for Discharge   Problem: Nutrition: Goal: Adequate nutrition will be maintained Outcome: Adequate for Discharge   Problem: Coping: Goal: Level of anxiety will decrease Outcome: Adequate for Discharge   Problem: Elimination: Goal: Will not experience complications related to bowel motility Outcome: Adequate for Discharge Goal: Will not experience complications related to urinary retention Outcome: Adequate for Discharge   Problem: Pain Managment: Goal: General experience of comfort will improve Outcome: Adequate for Discharge   Problem: Safety: Goal: Ability to remain free from injury will improve Outcome: Adequate for Discharge   Problem: Skin Integrity: Goal: Risk for impaired skin integrity will decrease Outcome: Adequate for Discharge   

## 2017-07-19 NOTE — Progress Notes (Signed)
Pt having episode of nausea and vomiting again this time. PRN Zofran not due until 0430. Last BP-118/74. Dr. Jodell Cipro paged and ordered for one time dose of Compazine 10mg  IV. Will administer and continue to monitor.
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
## 2017-07-19 NOTE — Discharge Instructions (Signed)
Resume your insulin pump as per your instructions from endocrinology at [facility] and follow-up on the appointment keep log of your sugars at home hydrate yourself

## 2017-07-19 NOTE — Progress Notes (Signed)
*  PRELIMINARY RESULTS* Echocardiogram 2D Echocardiogram has been performed.  [name] 07/19/2017, 3:57 PM

## 2017-07-19 NOTE — Progress Notes (Signed)
Inpatient Diabetes Program Recommendations  AACE/ADA: New Consensus Statement on Inpatient Glycemic Control (2015)  Target Ranges:  Prepandial:   less than 140 mg/dL      Peak postprandial:   less than 180 mg/dL (1-2 hours)      Critically ill patients:  140 - 180 mg/dL   Lab Results  Component Value Date   GLUCAP 238 (H) 07/19/2017   HGBA1C 15.0 (H) 07/18/2017    Review of Glycemic ControlResults for NEVADA, MULLETT (MRN [id number]) as of 07/19/2017 10:36  Ref. Range 07/18/2017 17:51 07/18/2017 18:42 07/18/2017 20:26 07/18/2017 21:21 07/19/2017 08:03  Glucose-Capillary Latest Ref Range: 65 - 99 mg/dL 364 (H) 404 (H) 338 (H) 331 (H) 238 (H)    Diabetes history: DM mellitus Outpatient Diabetes medications:  Insulin pump- Last visit with endocrinologist was 05/30/17 ( according to Rn patient no longer using insulin pump) Current orders for Inpatient glycemic control:  Novolog resistant tid with meals and HS, Lantus 40 units q HS Inpatient Diabetes Program Recommendations:   Agree with current orders.  May also need the addition of Novolog meal coverage 5 units tid with meals (hold if patient eats less than 50%). A1C has been consistently high.  Needs to f/u with endocrinology at [facility]  Thanks,  Adah Perl, RN, BC-ADM Inpatient Diabetes Coordinator Pager [phone] (8a-5p)

## 2017-07-20 LAB — RPR: RPR: NONREACTIVE

## 2017-07-20 LAB — HIV ANTIBODY (ROUTINE TESTING W REFLEX): HIV SCREEN 4TH GENERATION: NONREACTIVE

## 2017-08-05 ENCOUNTER — Ambulatory Visit: Payer: BC Managed Care – PPO | Admitting: Psychiatry

## 2017-08-18 ENCOUNTER — Ambulatory Visit: Payer: BC Managed Care – PPO | Admitting: Licensed Clinical Social Worker

## 2017-08-18 DIAGNOSIS — F331 Major depressive disorder, recurrent, moderate: Secondary | ICD-10-CM

## 2017-08-18 DIAGNOSIS — F5105 Insomnia due to other mental disorder: Secondary | ICD-10-CM
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
## 2017-09-06 ENCOUNTER — Other Ambulatory Visit: Payer: Self-pay | Admitting: *Deleted

## 2017-09-06 DIAGNOSIS — R2681 Unsteadiness on feet: Secondary | ICD-10-CM

## 2017-09-26 ENCOUNTER — Ambulatory Visit: Payer: BC Managed Care – PPO | Admitting: Licensed Clinical Social Worker

## 2017-09-30 ENCOUNTER — Ambulatory Visit: Payer: BC Managed Care – PPO | Admitting: Podiatry

## 2017-10-13 ENCOUNTER — Ambulatory Visit: Payer: BC Managed Care – PPO | Admitting: Licensed Clinical Social Worker

## 2017-10-14 ENCOUNTER — Encounter (HOSPITAL_COMMUNITY): Payer: Self-pay | Admitting: Emergency Medicine

## 2017-10-14 ENCOUNTER — Emergency Department (HOSPITAL_COMMUNITY): Payer: BC Managed Care – PPO

## 2017-10-14 ENCOUNTER — Emergency Department (HOSPITAL_COMMUNITY)
Admission: EM | Admit: 2017-10-14 | Discharge: 2017-10-14 | Disposition: A | Discharge status: Home / Self Care | Payer: BC Managed Care – PPO | Attending: Emergency Medicine | Admitting: Emergency Medicine

## 2017-10-14 ENCOUNTER — Other Ambulatory Visit: Payer: Self-pay

## 2017-10-14 DIAGNOSIS — Z79899 Other long term (current) drug therapy: Secondary | ICD-10-CM | POA: Insufficient documentation

## 2017-10-14 DIAGNOSIS — Y92008 Other place in unspecified non-institutional (private) residence as the place of occurrence of the external cause: Secondary | ICD-10-CM | POA: Insufficient documentation

## 2017-10-14 DIAGNOSIS — W010XXA Fall on same level from slipping, tripping and stumbling without subsequent striking against object, initial encounter: Secondary | ICD-10-CM | POA: Diagnosis not present

## 2017-10-14 DIAGNOSIS — R0789 Other chest pain: Secondary | ICD-10-CM | POA: Diagnosis not present

## 2017-10-14 DIAGNOSIS — Y939 Activity, unspecified: Secondary | ICD-10-CM | POA: Diagnosis not present

## 2017-10-14 DIAGNOSIS — R0781 Pleurodynia: Secondary | ICD-10-CM

## 2017-10-14 DIAGNOSIS — Y998 Other external cause status: Secondary | ICD-10-CM | POA: Diagnosis not present

## 2017-10-14 DIAGNOSIS — I10 Essential (primary) hypertension: Secondary | ICD-10-CM | POA: Diagnosis not present

## 2017-10-14 DIAGNOSIS — Z794 Long term (current) use of insulin: Secondary | ICD-10-CM | POA: Diagnosis not present

## 2017-10-14 DIAGNOSIS — S0990XA Unspecified injury of head, initial encounter: Secondary | ICD-10-CM | POA: Insufficient documentation

## 2017-10-14 DIAGNOSIS — E785 Hyperlipidemia, unspecified: Secondary | ICD-10-CM | POA: Diagnosis not present

## 2017-10-14 DIAGNOSIS — E1165 Type 2 diabetes mellitus with hyperglycemia: Secondary | ICD-10-CM | POA: Insufficient documentation

## 2017-10-14 DIAGNOSIS — Z23 Encounter for immunization: Secondary | ICD-10-CM | POA: Diagnosis not present

## 2017-10-14 DIAGNOSIS — S0181XA Laceration without foreign body of other part of head, initial encounter: Secondary | ICD-10-CM

## 2017-10-14 DIAGNOSIS — R739 Hyperglycemia, unspecified: Secondary | ICD-10-CM

## 2017-10-14 HISTORY — DX: Pain in left ankle and joints of left foot: M25.572

## 2017-10-14 HISTORY — DX: Noninfective gastroenteritis and colitis, unspecified: K52.9

## 2017-10-14 HISTORY — DX: Type 2 diabetes mellitus with diabetic neuropathy, unspecified: E11.40

## 2017-10-14 HISTORY — DX: Pain in right ankle and joints of right foot: M25.571

## 2017-10-14 HISTORY — DX: Nausea with vomiting, unspecified: R11.2

## 2017-10-14 HISTORY — DX: Other chronic pain: G89.29

## 2017-10-14 HISTORY — DX: Repeated falls: R29.6

## 2017-10-14 HISTORY — DX: Unsteadiness on feet: R26.81

## 2017-10-14 HISTORY — DX: Insomnia, unspecified: G47.00

## 2017-10-14 LAB — RAPID URINE DRUG SCREEN, HOSP PERFORMED
Amphetamines: NOT DETECTED
BENZODIAZEPINES: NOT DETECTED
Barbiturates: NOT DETECTED
COCAINE: NOT DETECTED
OPIATES: NOT DETECTED
Tetrahydrocannabinol: NOT DETECTED

## 2017-10-14 LAB — URINALYSIS, ROUTINE W REFLEX MICROSCOPIC
BACTERIA UA: NONE SEEN
Bilirubin Urine: NEGATIVE
Glucose, UA: >=500 mg/dL — AB
KETONES UR: NEGATIVE mg/dL
LEUKOCYTES UA: NEGATIVE
Nitrite: NEGATIVE
PROTEIN: 100 mg/dL — AB
Specific Gravity, Urine: 1.014 (ref 1.005–1.030)
pH: 5 (ref 5.0–8.0)

## 2017-10-14 LAB — TROPONIN I

## 2017-10-14 LAB — CBC WITH DIFFERENTIAL/PLATELET
BASOS PCT: 0 %
Basophils Absolute: 0 10*3/uL (ref 0.0–0.1)
EOS ABS: 0.2 10*3/uL (ref 0.0–0.7)
EOS PCT: 3 %
HCT: 32.1 % — ABNORMAL LOW (ref 39.0–52.0)
Hemoglobin: 9.9 g/dL — ABNORMAL LOW (ref 13.0–17.0)
LYMPHS ABS: 2.6 10*3/uL (ref 0.7–4.0)
Lymphocytes Relative: 29 %
MCH: 22.1 pg — AB (ref 26.0–34.0)
MCHC: 30.8 g/dL (ref 30.0–36.0)
MCV: 71.7 fL — ABNORMAL LOW (ref 78.0–100.0)
Monocytes Absolute: 1 10*3/uL (ref 0.1–1.0)
Monocytes Relative: 11 %
Neutro Abs: 5.2 10*3/uL (ref 1.7–7.7)
Neutrophils Relative %: 57 %
PLATELETS: 257 10*3/uL (ref 150–400)
RBC: 4.48 MIL/uL (ref 4.22–5.81)
RDW: 14 % (ref 11.5–15.5)
WBC: 9.1 10*3/uL (ref 4.0–10.5)

## 2017-10-14 LAB — BASIC METABOLIC PANEL
ANION GAP: 9 (ref 5–15)
BUN: 34 mg/dL — ABNORMAL HIGH (ref 6–20)
CALCIUM: 8.3 mg/dL — AB (ref 8.9–10.3)
CO2: 27 mmol/L (ref 22–32)
CREATININE: 2.34 mg/dL — AB (ref 0.61–1.24)
Chloride: 98 mmol/L (ref 98–111)
GFR, EST AFRICAN AMERICAN: 36 mL/min — AB (ref >60)
GFR, EST NON AFRICAN AMERICAN: 31 mL/min — AB (ref >60)
Glucose, Bld: 449 mg/dL — ABNORMAL HIGH (ref 70–99)
Potassium: 4.1 mmol/L (ref 3.5–5.1)
SODIUM: 134 mmol/L — AB (ref 135–145)

## 2017-10-14 LAB — CBG MONITORING, ED: Glucose-Capillary: 331 mg/dL — ABNORMAL HIGH (ref 70–99)

## 2017-10-14 MED ORDER — CARVEDILOL 12.5 MG PO TABS
12.5000 mg | ORAL_TABLET | Freq: Once | ORAL | Status: AC
  Administered 2017-10-14: 12.5 mg via ORAL
  Filled 2017-10-14: qty 1

## 2017-10-14 MED ORDER — TETANUS-DIPHTH-ACELL PERTUSSIS 5-2.5-18.5 LF-MCG/0.5 IM SUSP
0.5000 mL | Freq: Once | INTRAMUSCULAR | Status: AC
  Administered 2017-10-14: 0.5 mL via INTRAMUSCULAR
  Filled 2017-10-14: qty 0.5

## 2017-10-14 MED ORDER — HYDROCODONE-ACETAMINOPHEN 5-325 MG PO TABS: ORAL_TABLET | ORAL | 0 refills | Status: DC

## 2017-10-14 MED ORDER — INSULIN ASPART 100 UNIT/ML ~~LOC~~ SOLN
8.0000 [IU] | Freq: Once | SUBCUTANEOUS | Status: AC
  Administered 2017-10-14: 8 [IU] via SUBCUTANEOUS
  Filled 2017-10-14: qty 1

## 2017-10-14 NOTE — ED Provider Notes (Signed)
[facility] EMERGENCY DEPARTMENT Provider Note   CSN: [id number] Arrival date & time: 10/14/17  9485     History   Chief Complaint Chief Complaint  Patient presents with  . Head Injury    HPI [name]. is a 47 y.o. male.  HPI  Pt was seen at 1650. Per pt, c/o sudden onset and resolution of one episode of fall that occurred last night approximately 2330. Pt states he was asleep, got up to go to the bathroom, and then fell in the hallway. Pt states he "felt woozy" and "may have passed out" for "2-3 seconds." Pt states he fell to his right, hitting the right side of his head, face and ribs. Pt denies CP/palpitations, no SOB/cough, no abd pain, no N/V/D, no eye injury, no eye pain, no visual changes, no focal motor weakness, no tingling/numbness in extremities, no ataxia, no slurred speech, no facial droop.     Unk Td Past Medical History:  Diagnosis Date  . Anxiety   . Cellulitis   . Chronic diarrhea   . Chronic kidney disease   . Chronic pain of both ankles   . Diabetes mellitus without complication ([place])   . Diabetes mellitus, type II ([place])   . Diabetic neuropathy ([place])   . Frequent falls   . Gait instability   . Heart palpitations   . HLD (hyperlipidemia)   . Hypertension   . Insomnia   . Nausea and vomiting in adult    recurrent    Patient Active Problem List   Diagnosis Date Noted  . Heat exhaustion 07/18/2017  . Diabetic retinopathy ([place]) 07/05/2017  . Uncontrolled diabetes mellitus ([place]) 07/08/2016  . Uncontrolled type 2 diabetes mellitus with hyperglycemia, with long-term current use of insulin ([place]) 04/20/2016  . Osteomyelitis of toe of left foot ([place]) 03/15/2016  . Cellulitis, face 06/24/2015  . Facial cellulitis 06/23/2015  . AKI (acute kidney injury) ([employer]) 06/23/2015  . Diarrhea 06/23/2015  . Diabetes mellitus without complication ([place])   . HLD (hyperlipidemia)   . Essential hypertension   . Heart palpitations 05/06/2015  . SOB  (shortness of breath) 05/06/2015  . Sepsis ([place]) 03/07/2015    Past Surgical History:  Procedure Laterality Date  . AMPUTATION Left 03/16/2016   Procedure: AMPUTATION DIGIT LEFT HALLUX;  Surgeon: Trula Slade, DPM;  Location: [facility];  Service: Podiatry;  Laterality: Left;  can start around 5   . ARTHROSCOPIC REPAIR ACL Left   . COLONOSCOPY WITH PROPOFOL N/A 10/28/2015   Procedure: COLONOSCOPY WITH PROPOFOL;  Surgeon: Lollie Sails, MD;  Location: [facility] ENDOSCOPY;  Service: Endoscopy;  Laterality: N/A;  . COLONOSCOPY WITH PROPOFOL N/A 10/29/2015   Procedure: COLONOSCOPY WITH PROPOFOL;  Surgeon: Lollie Sails, MD;  Location: [facility] ENDOSCOPY;  Service: Endoscopy;  Laterality: N/A;  . PROSTATE SURGERY  2016  . TONSILECTOMY/ADENOIDECTOMY WITH MYRINGOTOMY    . TONSILLECTOMY          Home Medications    Prior to Admission medications   Medication Sig Start Date End Date Taking? Authorizing Provider  buPROPion (WELLBUTRIN) 75 MG tablet Take 1 tablet (75 mg total) by mouth every morning. 07/05/17  Yes Ursula Alert, MD  calcitRIOL (ROCALTROL) 0.25 MCG capsule Take 1 capsule by mouth daily. 09/20/17  Yes [provider]  calcium citrate-vitamin D (CITRACAL+D) 315-200 MG-UNIT tablet Take 2 tablets by mouth daily.    Yes [provider]  carvedilol (COREG) 12.5 MG tablet Take 12.5 mg by mouth 2 (two)  times daily with a meal.  09/20/17  Yes [provider]  cholestyramine light (PREVALITE) 4 g packet Take 4 g by mouth daily as needed (digestion).  11/17/15  Yes [provider]  citalopram (CELEXA) 20 MG tablet Take 20 mg by mouth daily.  06/16/17  Yes [provider]  diltiazem (CARDIZEM) 120 MG tablet Take 120 mg by mouth daily.    Yes [provider]  FEROSUL 325 (65 Fe) MG tablet Take 325 mg by mouth 2 (two) times daily with a meal.  09/07/17  Yes [provider]  furosemide (LASIX) 40 MG tablet Take 40 mg by mouth daily.  09/20/17   Yes [provider]  insulin aspart (NOVOLOG) 100 UNIT/ML FlexPen Inject 60 Units into the skin 3 (three) times daily. Patient taking differently: Inject 0-300 Units into the skin continuous. Insulin pump 08/19/16 10/14/17 Yes Mody, Sital, MD  lisinopril (PRINIVIL,ZESTRIL) 20 MG tablet Take 20 mg by mouth daily.  10/04/17  Yes [provider]  loratadine (CLARITIN) 10 MG tablet Take 10 mg by mouth daily.   Yes [provider]  metFORMIN (GLUCOPHAGE) 500 MG tablet Take 1,000 mg by mouth 2 (two) times daily with a meal.   Yes [provider]  metoCLOPramide (REGLAN) 5 MG tablet Take 5 mg by mouth 4 (four) times daily -  before meals and at bedtime.  10/13/17  Yes [provider]  ondansetron (ZOFRAN-ODT) 4 MG disintegrating tablet Take 4 mg by mouth every 8 (eight) hours as needed for nausea or vomiting.  10/13/17  Yes [provider]  pantoprazole (PROTONIX) 40 MG tablet Take 40 mg by mouth 2 (two) times daily.   Yes [provider]  rosuvastatin (CRESTOR) 40 MG tablet Take 40 mg by mouth daily.  05/26/17  Yes [provider]  tamsulosin (FLOMAX) 0.4 MG CAPS capsule Take 0.4 mg by mouth daily after breakfast.    Yes [provider]  traZODone (DESYREL) 50 MG tablet Take 0.5-1 tablets (25-50 mg total) by mouth at bedtime as needed for sleep. 07/05/17  Yes Ursula Alert, MD  fluticasone (FLONASE) 50 MCG/ACT nasal spray Place 1 spray into both nostrils daily as needed for allergies.  10/06/16   [provider]    Family History Family History  Problem Relation Age of Onset  . CAD Father   . Stroke Father   . Diabetes Mellitus II Mother   . Kidney failure Mother   . Schizophrenia Mother     Social History Social History   Tobacco Use  . Smoking status: Never Smoker  . Smokeless tobacco: Never Used  Substance Use Topics  . Alcohol use: No  . Drug use: No     Allergies   Levofloxacin; Phenergan  [promethazine hcl]; Malt; and Soy allergy   Review of Systems Review of Systems ROS: Statement: All systems negative except as marked or noted in the HPI; Constitutional: Negative for fever and chills. ; ; Eyes: Negative for eye pain, redness and discharge. ; ; ENMT: Negative for ear pain, hoarseness, nasal congestion, sinus pressure and sore throat. ; ; Cardiovascular: Negative for chest pain, palpitations, diaphoresis, dyspnea and peripheral edema. ; ; Respiratory: Negative for cough, wheezing and stridor. ; ; Gastrointestinal: Negative for nausea, vomiting, diarrhea, abdominal pain, blood in stool, hematemesis, jaundice and rectal bleeding. . ; ; Genitourinary: Negative for dysuria, flank pain and hematuria. ; ; Musculoskeletal: +head injury, face injury, right ribs pain. Negative for back pain and neck pain.  Negative for swelling and deformity.; ; Skin: Negative for pruritus, rash, abrasions, blisters, bruising and skin lesion.; ; Neuro: Negative for lightheadedness and neck stiffness. Negative for weakness, extremity weakness, paresthesias, involuntary movement, seizure.       Physical Exam Updated Vital Signs BP (!) 190/105 (BP Location: Left Arm)   Pulse 90   Temp 98.4 F (36.9 C) (Oral)   Resp 18   Ht 6\' 3"  (1.905 m)   Wt 103.4 kg   SpO2 99%   BMI 28.50 kg/m    BP (!) 160/99   Pulse 94   Temp 98.4 F (36.9 C) (Oral)   Resp 16   Ht 6\' 3"  (1.905 m)   Wt 103.4 kg   SpO2 98%   BMI 28.50 kg/m    18:06 Orthostatic Vital Signs VP  Orthostatic Lying   BP- Lying: 199/103Abnormal    Pulse- Lying: 90       Orthostatic Sitting  BP- Sitting: 184/99Abnormal    Pulse- Sitting: 90       Orthostatic Standing at 0 minutes  BP- Standing at 0 minutes: 175/108Abnormal    Pulse- Standing at 0 minutes: 99      Physical Exam 1655: Physical examination: Vital signs and O2 SAT: Reviewed; Constitutional: Well developed, Well nourished, Well hydrated, In no acute distress; Head and  Face: Normocephalic, No scalp hematomas, no lacs.  +small superficial healing linear lac to medial eyebrow area.  +TTP right superior and inferior orbital rim areas. Non-tender to palp left superior and inferior orbital rim areas.  No zygoma tenderness.  No mandibular tenderness.; Eyes: EOMI without pain. PERRL, No scleral icterus. No conjunctival injection. No obvious hyphema or hypopyon.; ENMT: Mouth and pharynx normal, Left TM normal, Right TM normal, Mucous membranes moist, +teeth and tongue intact.  No intraoral or intranasal bleeding.  No septal hematomas.  No trismus, no malocclusion.;  Neck: Supple, Trachea midline; Spine:  No midline CS, TS, LS tenderness.; Cardiovascular: Regular rate and rhythm, No gallop; Respiratory: Breath sounds clear & equal bilaterally, No wheezes, Normal respiratory effort/excursion; Chest: +right mid-anterior lateral ribs tender to palp.  No deformity, Movement normal, No crepitus, No abrasions or ecchymosis.; Abdomen: Soft, Nontender, Nondistended, Normal bowel sounds, No abrasions or ecchymosis.; Genitourinary: No CVA tenderness;; Extremities: Full range of motion major/large joints of bilat UE's and LE's without pain or tenderness to palp, Neurovascularly intact, Pulses normal, No deformity. No tenderness, +2 pedal edema bilat. Pelvis stable; Neuro: AA&Ox3, GCS 15.  Major CN grossly intact. Speech clear. No gross focal motor or sensory deficits in extremities.; Skin: Color normal, Warm, Dry   ED Treatments / Results  Labs (all labs ordered are listed, but only abnormal results are displayed)   EKG EKG Interpretation  Date/Time:  Friday October 14 2017 17:57:52 EDT Ventricular Rate:  89 PR Interval:    QRS Duration: 101 QT Interval:  379 QTC Calculation: 462 R Axis:   80 Text Interpretation:  Sinus rhythm Borderline T wave abnormalities When compared with ECG of 07/18/2017 No significant change was found Confirmed by Francine Graven [phone]) on 10/14/2017 6:12:56  PM   Radiology   Procedures Procedures (including critical care time)  Medications Ordered in ED Medications  Tdap (BOOSTRIX) injection 0.5 mL (has no administration in time range)     Initial Impression / Assessment and Plan / ED Course  I have reviewed the triage vital signs and the nursing notes.  Pertinent labs & imaging results that were available during my care of the  patient were reviewed by me and considered in my medical decision making (see chart for details).  MDM Reviewed: previous chart, nursing note and vitals Reviewed previous: labs and ECG Interpretation: labs, ECG, x-ray and CT scan   Results for orders placed or performed during the hospital encounter of 10/14/17  Urine rapid drug screen (hosp performed)  Result Value Ref Range   Opiates NONE DETECTED NONE DETECTED   Cocaine NONE DETECTED NONE DETECTED   Benzodiazepines NONE DETECTED NONE DETECTED   Amphetamines NONE DETECTED NONE DETECTED   Tetrahydrocannabinol NONE DETECTED NONE DETECTED   Barbiturates NONE DETECTED NONE DETECTED  Urinalysis, Routine w reflex microscopic  Result Value Ref Range   Color, Urine YELLOW YELLOW   APPearance CLEAR CLEAR   Specific Gravity, Urine 1.014 1.005 - 1.030   pH 5.0 5.0 - 8.0   Glucose, UA >=500 (A) NEGATIVE mg/dL   Hgb urine dipstick SMALL (A) NEGATIVE   Bilirubin Urine NEGATIVE NEGATIVE   Ketones, ur NEGATIVE NEGATIVE mg/dL   Protein, ur 100 (A) NEGATIVE mg/dL   Nitrite NEGATIVE NEGATIVE   Leukocytes, UA NEGATIVE NEGATIVE   RBC / HPF 0-5 0 - 5 RBC/hpf   WBC, UA 0-5 0 - 5 WBC/hpf   Bacteria, UA NONE SEEN NONE SEEN   Squamous Epithelial / LPF 0-5 0 - 5   Hyaline Casts, UA PRESENT   Basic metabolic panel  Result Value Ref Range   Sodium 134 (L) 135 - 145 mmol/L   Potassium 4.1 3.5 - 5.1 mmol/L   Chloride 98 98 - 111 mmol/L   CO2 27 22 - 32 mmol/L   Glucose, Bld 449 (H) 70 - 99 mg/dL   BUN 34 (H) 6 - 20 mg/dL   Creatinine, Ser 2.34 (H) 0.61 - 1.24 mg/dL    Calcium 8.3 (L) 8.9 - 10.3 mg/dL   GFR calc non Af Amer 31 (L) >60 mL/min   GFR calc Af Amer 36 (L) >60 mL/min   Anion gap 9 5 - 15  Troponin I  Result Value Ref Range   Troponin I <0.03 <0.03 ng/mL  CBC with Differential  Result Value Ref Range   WBC 9.1 4.0 - 10.5 K/uL   RBC 4.48 4.22 - 5.81 MIL/uL   Hemoglobin 9.9 (L) 13.0 - 17.0 g/dL   HCT 32.1 (L) 39.0 - 52.0 %   MCV 71.7 (L) 78.0 - 100.0 fL   MCH 22.1 (L) 26.0 - 34.0 pg   MCHC 30.8 30.0 - 36.0 g/dL   RDW 14.0 11.5 - 15.5 %   Platelets 257 150 - 400 K/uL   Neutrophils Relative % 57 %   Neutro Abs 5.2 1.7 - 7.7 K/uL   Lymphocytes Relative 29 %   Lymphs Abs 2.6 0.7 - 4.0 K/uL   Monocytes Relative 11 %   Monocytes Absolute 1.0 0.1 - 1.0 K/uL   Eosinophils Relative 3 %   Eosinophils Absolute 0.2 0.0 - 0.7 K/uL   Basophils Relative 0 %   Basophils Absolute 0.0 0.0 - 0.1 K/uL  CBG monitoring, ED  Result Value Ref Range   Glucose-Capillary 331 (H) 70 - 99 mg/dL   Dg Ribs Unilateral W/chest Right Result Date: 10/14/2017 CLINICAL DATA:  Fall last night.  Right anterolateral rib pain EXAM: RIGHT RIBS AND CHEST - 3+ VIEW COMPARISON:  07/18/2017 FINDINGS: No pneumothorax or pulmonary contusion. Cardiac and mediastinal margins appear normal. No pleural effusion. I do not see a definite rib fracture. IMPRESSION: 1. No rib fractures identified.  Please note that nondisplaced rib fractures can be occult on conventional radiography. 2. No pneumothorax, pulmonary contusion, or pleural effusion. Electronically Signed   By: Van Clines M.D.   On: 10/14/2017 17:54   Ct Head Wo Contrast Result Date: 10/14/2017 CLINICAL DATA:  [place] last night striking head and RIGHT rib cage, loss of consciousness, laceration RIGHT supraorbital, history type II diabetes mellitus, hypertension EXAM: CT HEAD WITHOUT CONTRAST CT MAXILLOFACIAL WITHOUT CONTRAST CT CERVICAL SPINE WITHOUT CONTRAST TECHNIQUE: Multidetector CT imaging of the head, cervical spine,  and maxillofacial structures were performed using the standard protocol without intravenous contrast. Multiplanar CT image reconstructions of the cervical spine and maxillofacial structures were also generated. Right side of face marked with BB. COMPARISON:  CT head 07/18/2017, CT maxillofacial 03/06/2015 FINDINGS: CT HEAD FINDINGS Brain: Normal ventricular morphology. No midline shift or mass effect. Normal appearance of brain parenchyma. No intracranial hemorrhage, mass lesion, evidence of acute infarction, or extra-axial fluid collection. Dural calcification in anterior falx unchanged. Vascular: No hyperdense vessels Skull: Calvaria intact Other: N/A CT MAXILLOFACIAL FINDINGS Osseous: Osseous mineralization normal. Mandible intact with normal TMJ alignments. Orbits, zygomas, and sinuses intact. No facial bone fractures identified. Minimal nasal septal deviation to the LEFT. Orbits: Intraorbital soft tissue planes clear bilaterally. No orbital fractures or pneumatosis. Sinuses: Paranasal sinuses, mastoid air cells, and middle ear cavities clear bilaterally. Soft tissues: Small frontal scalp hematoma extending to base of nose. Remaining facial soft tissues unremarkable. CT CERVICAL SPINE FINDINGS Alignment: Mild retrolisthesis at C3-C4. Remaining alignments normal. Skull base and vertebrae: Osseous mineralization normal. Visualized skull base intact. Disc space narrowing with endplate spur formation at C3-C4 associated with retrolisthesis. Additional mild endplate spur formation at C4-C5 and C5-C6. Vertebral body heights maintained without fracture or bone destruction. Soft tissues and spinal canal: Calcified and enlarged LEFT thyrohyoid ligament. Prevertebral soft tissues normal thickness. Disc levels:  Mildly bulging C3-C4 disc. Upper chest: Tips of lung apices clear Other: N/A IMPRESSION: Normal CT head. No acute facial bone abnormalities. Degenerative disc disease changes of the cervical spine. No acute  cervical spine abnormalities. Small RIGHT frontal scalp hematoma. Electronically Signed   By: Lavonia Dana M.D.   On: 10/14/2017 18:01   Ct Cervical Spine Wo Contrast Result Date: 10/14/2017 CLINICAL DATA:  [place] last night striking head and RIGHT rib cage, loss of consciousness, laceration RIGHT supraorbital, history type II diabetes mellitus, hypertension EXAM: CT HEAD WITHOUT CONTRAST CT MAXILLOFACIAL WITHOUT CONTRAST CT CERVICAL SPINE WITHOUT CONTRAST TECHNIQUE: Multidetector CT imaging of the head, cervical spine, and maxillofacial structures were performed using the standard protocol without intravenous contrast. Multiplanar CT image reconstructions of the cervical spine and maxillofacial structures were also generated. Right side of face marked with BB. COMPARISON:  CT head 07/18/2017, CT maxillofacial 03/06/2015 FINDINGS: CT HEAD FINDINGS Brain: Normal ventricular morphology. No midline shift or mass effect. Normal appearance of brain parenchyma. No intracranial hemorrhage, mass lesion, evidence of acute infarction, or extra-axial fluid collection. Dural calcification in anterior falx unchanged. Vascular: No hyperdense vessels Skull: Calvaria intact Other: N/A CT MAXILLOFACIAL FINDINGS Osseous: Osseous mineralization normal. Mandible intact with normal TMJ alignments. Orbits, zygomas, and sinuses intact. No facial bone fractures identified. Minimal nasal septal deviation to the LEFT. Orbits: Intraorbital soft tissue planes clear bilaterally. No orbital fractures or pneumatosis. Sinuses: Paranasal sinuses, mastoid air cells, and middle ear cavities clear bilaterally. Soft tissues: Small frontal scalp hematoma extending to base of nose. Remaining facial soft tissues unremarkable. CT CERVICAL SPINE FINDINGS Alignment: Mild retrolisthesis at C3-C4.  Remaining alignments normal. Skull base and vertebrae: Osseous mineralization normal. Visualized skull base intact. Disc space narrowing with endplate spur formation  at C3-C4 associated with retrolisthesis. Additional mild endplate spur formation at C4-C5 and C5-C6. Vertebral body heights maintained without fracture or bone destruction. Soft tissues and spinal canal: Calcified and enlarged LEFT thyrohyoid ligament. Prevertebral soft tissues normal thickness. Disc levels:  Mildly bulging C3-C4 disc. Upper chest: Tips of lung apices clear Other: N/A IMPRESSION: Normal CT head. No acute facial bone abnormalities. Degenerative disc disease changes of the cervical spine. No acute cervical spine abnormalities. Small RIGHT frontal scalp hematoma. Electronically Signed   By: Lavonia Dana M.D.   On: 10/14/2017 18:01   Ct Maxillofacial Wo Cm Result Date: 10/14/2017 CLINICAL DATA:  [place] last night striking head and RIGHT rib cage, loss of consciousness, laceration RIGHT supraorbital, history type II diabetes mellitus, hypertension EXAM: CT HEAD WITHOUT CONTRAST CT MAXILLOFACIAL WITHOUT CONTRAST CT CERVICAL SPINE WITHOUT CONTRAST TECHNIQUE: Multidetector CT imaging of the head, cervical spine, and maxillofacial structures were performed using the standard protocol without intravenous contrast. Multiplanar CT image reconstructions of the cervical spine and maxillofacial structures were also generated. Right side of face marked with BB. COMPARISON:  CT head 07/18/2017, CT maxillofacial 03/06/2015 FINDINGS: CT HEAD FINDINGS Brain: Normal ventricular morphology. No midline shift or mass effect. Normal appearance of brain parenchyma. No intracranial hemorrhage, mass lesion, evidence of acute infarction, or extra-axial fluid collection. Dural calcification in anterior falx unchanged. Vascular: No hyperdense vessels Skull: Calvaria intact Other: N/A CT MAXILLOFACIAL FINDINGS Osseous: Osseous mineralization normal. Mandible intact with normal TMJ alignments. Orbits, zygomas, and sinuses intact. No facial bone fractures identified. Minimal nasal septal deviation to the LEFT. Orbits: Intraorbital  soft tissue planes clear bilaterally. No orbital fractures or pneumatosis. Sinuses: Paranasal sinuses, mastoid air cells, and middle ear cavities clear bilaterally. Soft tissues: Small frontal scalp hematoma extending to base of nose. Remaining facial soft tissues unremarkable. CT CERVICAL SPINE FINDINGS Alignment: Mild retrolisthesis at C3-C4. Remaining alignments normal. Skull base and vertebrae: Osseous mineralization normal. Visualized skull base intact. Disc space narrowing with endplate spur formation at C3-C4 associated with retrolisthesis. Additional mild endplate spur formation at C4-C5 and C5-C6. Vertebral body heights maintained without fracture or bone destruction. Soft tissues and spinal canal: Calcified and enlarged LEFT thyrohyoid ligament. Prevertebral soft tissues normal thickness. Disc levels:  Mildly bulging C3-C4 disc. Upper chest: Tips of lung apices clear Other: N/A IMPRESSION: Normal CT head. No acute facial bone abnormalities. Degenerative disc disease changes of the cervical spine. No acute cervical spine abnormalities. Small RIGHT frontal scalp hematoma. Electronically Signed   By: Lavonia Dana M.D.   On: 10/14/2017 18:01    2200:  Lac superficial and nearly 24 hrs old; wound care only. Td updated. BUN/Cr within pt's baseline. Glucose elevated; AG normal. SQ insulin given. Pt states he "took all my BP meds in the morning." Med list reviewed by [facility], states coreg should be BID. Pt agitated now; wants to leave. Will dose PO coreg; pt encouraged to check dosing schedule with PMD. Pt has been ambulatory around the ED with steady gait, easy resps, NAD. Denies CP/SOB, abd pain, focal deficits. Low risk syncope score. Pt "just wants to leave now." Strongly encouraged to take his meds as prescribed and f/u with PMD and specialist MD's for good continuity of care and control of his chronic medical conditions. Dx and testing d/w pt.  Questions answered.  Verb understanding, agreeable to d/c  home with  outpt f/u.       Final Clinical Impressions(s) / ED Diagnoses   Final diagnoses:  None    ED Discharge Orders    None       Francine Graven, DO 10/19/17 2341

## 2017-10-14 NOTE — ED Triage Notes (Addendum)
Patient states he fell last night hitting his head and right rib cage. States he did lose consciousness but does not remember episode. Complaining of headache and right rib pain. Laceration noted to right eyebrow.

## 2017-10-14 NOTE — Discharge Instructions (Addendum)
Take the prescription as directed. Verify how many times per day you are to take your coreg medication (for your blood pressure). Wash the abraded area gently with soap and water, and pat dry, at least twice a day, and cover with a clean/dry dressing if needed.  Change the dressing whenever it becomes wet or soiled after washing the area with soap and water and patting dry. Apply moist heat or ice to the area(s) of discomfort, for 15 minutes at a time, several times per day for the next few days.  Do not fall asleep on a heating or ice pack.  Call your regular medical doctor Monday morning to schedule a follow up appointment within the next 3 days.  Return to the Emergency Department immediately if worsening.

## 2017-10-14 NOTE — ED Notes (Signed)
Patient denies any dizziness upon sitting or standing, but did state that he had a "slight dizzy spell" when I laid him flat.

## 2017-11-07 ENCOUNTER — Other Ambulatory Visit: Payer: Self-pay

## 2017-11-07 ENCOUNTER — Emergency Department
Admission: EM | Admit: 2017-11-07 | Discharge: 2017-11-07 | Disposition: A | Discharge status: Home / Self Care | Payer: BC Managed Care – PPO | Source: Home / Self Care | Attending: Emergency Medicine | Admitting: Emergency Medicine

## 2017-11-07 ENCOUNTER — Encounter: Admission: RE | Payer: Self-pay | Source: Ambulatory Visit

## 2017-11-07 ENCOUNTER — Encounter: Payer: Self-pay | Admitting: Emergency Medicine

## 2017-11-07 ENCOUNTER — Ambulatory Visit
Admission: RE | Admit: 2017-11-07 | Payer: BC Managed Care – PPO | Source: Ambulatory Visit | Admitting: Gastroenterology

## 2017-11-07 DIAGNOSIS — R1012 Left upper quadrant pain: Secondary | ICD-10-CM | POA: Insufficient documentation

## 2017-11-07 DIAGNOSIS — I129 Hypertensive chronic kidney disease with stage 1 through stage 4 chronic kidney disease, or unspecified chronic kidney disease: Secondary | ICD-10-CM | POA: Insufficient documentation

## 2017-11-07 DIAGNOSIS — Z79899 Other long term (current) drug therapy: Secondary | ICD-10-CM

## 2017-11-07 DIAGNOSIS — R112 Nausea with vomiting, unspecified: Secondary | ICD-10-CM

## 2017-11-07 DIAGNOSIS — F419 Anxiety disorder, unspecified: Secondary | ICD-10-CM | POA: Insufficient documentation

## 2017-11-07 DIAGNOSIS — N179 Acute kidney failure, unspecified: Secondary | ICD-10-CM | POA: Diagnosis not present

## 2017-11-07 DIAGNOSIS — Z7984 Long term (current) use of oral hypoglycemic drugs: Secondary | ICD-10-CM

## 2017-11-07 DIAGNOSIS — E1122 Type 2 diabetes mellitus with diabetic chronic kidney disease: Secondary | ICD-10-CM | POA: Insufficient documentation

## 2017-11-07 DIAGNOSIS — I130 Hypertensive heart and chronic kidney disease with heart failure and stage 1 through stage 4 chronic kidney disease, or unspecified chronic kidney disease: Secondary | ICD-10-CM | POA: Diagnosis not present

## 2017-11-07 DIAGNOSIS — N189 Chronic kidney disease, unspecified: Secondary | ICD-10-CM | POA: Insufficient documentation

## 2017-11-07 LAB — COMPREHENSIVE METABOLIC PANEL
ALBUMIN: 2.3 g/dL — AB (ref 3.5–5.0)
ALK PHOS: 83 U/L (ref 38–126)
ALT: 22 U/L (ref 0–44)
AST: 24 U/L (ref 15–41)
Anion gap: 6 (ref 5–15)
BUN: 36 mg/dL — AB (ref 6–20)
CALCIUM: 8 mg/dL — AB (ref 8.9–10.3)
CHLORIDE: 100 mmol/L (ref 98–111)
CO2: 32 mmol/L (ref 22–32)
CREATININE: 2.67 mg/dL — AB (ref 0.61–1.24)
GFR calc Af Amer: 31 mL/min — ABNORMAL LOW (ref >60)
GFR calc non Af Amer: 26 mL/min — ABNORMAL LOW (ref >60)
GLUCOSE: 218 mg/dL — AB (ref 70–99)
Potassium: 3.4 mmol/L — ABNORMAL LOW (ref 3.5–5.1)
SODIUM: 138 mmol/L (ref 135–145)
Total Bilirubin: 0.7 mg/dL (ref 0.3–1.2)
Total Protein: 5.5 g/dL — ABNORMAL LOW (ref 6.5–8.1)

## 2017-11-07 LAB — URINALYSIS, COMPLETE (UACMP) WITH MICROSCOPIC
Bilirubin Urine: NEGATIVE
Glucose, UA: >=500 mg/dL — AB
Ketones, ur: NEGATIVE mg/dL
Leukocytes, UA: NEGATIVE
Nitrite: NEGATIVE
Specific Gravity, Urine: 1.016 (ref 1.005–1.030)
pH: 5 (ref 5.0–8.0)

## 2017-11-07 LAB — CBC
HCT: 30.3 % — ABNORMAL LOW (ref 40.0–52.0)
Hemoglobin: 10 g/dL — ABNORMAL LOW (ref 13.0–18.0)
MCH: 23.4 pg — AB (ref 26.0–34.0)
MCHC: 33 g/dL (ref 32.0–36.0)
MCV: 70.8 fL — AB (ref 80.0–100.0)
PLATELETS: 275 10*3/uL (ref 150–440)
RBC: 4.28 MIL/uL — AB (ref 4.40–5.90)
RDW: 14.7 % — AB (ref 11.5–14.5)
WBC: 7.7 10*3/uL (ref 3.8–10.6)

## 2017-11-07 LAB — LIPASE, BLOOD: LIPASE: 23 U/L (ref 11–51)

## 2017-11-07 SURGERY — EGD (ESOPHAGOGASTRODUODENOSCOPY)
Anesthesia: General

## 2017-11-07 MED ORDER — DICYCLOMINE HCL 20 MG PO TABS: 20.0000 mg | ORAL_TABLET | Freq: Three times a day (TID) | ORAL | 0 refills | Status: DC | PRN

## 2017-11-07 MED ORDER — METOCLOPRAMIDE HCL 10 MG PO TABS: 10.0000 mg | ORAL_TABLET | Freq: Three times a day (TID) | ORAL | 0 refills | Status: DC | PRN

## 2017-11-07 MED ORDER — ONDANSETRON 4 MG PO TBDP
4.0000 mg | ORAL_TABLET | Freq: Once | ORAL | Status: AC
  Administered 2017-11-07: 4 mg via ORAL
  Filled 2017-11-07: qty 1

## 2017-11-07 MED ORDER — GI COCKTAIL ~~LOC~~
30.0000 mL | Freq: Once | ORAL | Status: AC
  Administered 2017-11-07: 30 mL via ORAL
  Filled 2017-11-07: qty 30

## 2017-11-07 NOTE — ED Triage Notes (Signed)
Pt in via POV, reports LUQ abdominal pain w/ N/V/D.  Pt vague in regards to time frame; pt states, "I had a rough weekend, I cant keep anything down, this has been going on for a while, I'm seeing GI here."  Vitals WDL, NAD noted at this time.

## 2017-11-07 NOTE — ED Provider Notes (Signed)
[facility] Emergency Department Provider Note   ____________________________________________   I have reviewed the triage vital signs and the nursing notes.   HISTORY  Chief Complaint Abdominal Pain   History limited by: Not Limited   HPI [name] Jasaun Carn. is a 47 y.o. male who presents to the emergency department today because of concerns for continued abdominal discomfort and nausea and vomiting.  Patient states the symptoms have been going on for quite some time.  He does follow-up with GI.  He in fact had an EGD scheduled for today which had to be canceled because of hyperglycemia.  Patient states that his food takes a while to move through his stomach.  He is on an and acid as well as a medication to help move the food through his stomach.  He denies taking anything for the abdominal pain.  He denies any fevers.   Per medical record review patient has a history of DM  Past Medical History:  Diagnosis Date  . Anxiety   . Cellulitis   . Chronic diarrhea   . Chronic kidney disease   . Chronic pain of both ankles   . Diabetes mellitus without complication ([place])   . Diabetes mellitus, type II ([place])   . Diabetic neuropathy ([place])   . Frequent falls   . Gait instability   . Heart palpitations   . HLD (hyperlipidemia)   . Hypertension   . Insomnia   . Nausea and vomiting in adult    recurrent    Patient Active Problem List   Diagnosis Date Noted  . Heat exhaustion 07/18/2017  . Diabetic retinopathy ([place]) 07/05/2017  . Uncontrolled diabetes mellitus ([place]) 07/08/2016  . Uncontrolled type 2 diabetes mellitus with hyperglycemia, with long-term current use of insulin ([place]) 04/20/2016  . Osteomyelitis of toe of left foot ([place]) 03/15/2016  . Cellulitis, face 06/24/2015  . Facial cellulitis 06/23/2015  . AKI (acute kidney injury) ([place]) 06/23/2015  . Diarrhea 06/23/2015  . Diabetes mellitus without complication ([place])   . HLD (hyperlipidemia)   .  Essential hypertension   . Heart palpitations 05/06/2015  . SOB (shortness of breath) 05/06/2015  . Sepsis ([place]) 03/07/2015    Past Surgical History:  Procedure Laterality Date  . AMPUTATION Left 03/16/2016   Procedure: AMPUTATION DIGIT LEFT HALLUX;  Surgeon: Trula Slade, DPM;  Location: [place];  Service: Podiatry;  Laterality: Left;  can start around 5   . ARTHROSCOPIC REPAIR ACL Left   . COLONOSCOPY WITH PROPOFOL N/A 10/28/2015   Procedure: COLONOSCOPY WITH PROPOFOL;  Surgeon: Lollie Sails, MD;  Location: [facility] ENDOSCOPY;  Service: Endoscopy;  Laterality: N/A;  . COLONOSCOPY WITH PROPOFOL N/A 10/29/2015   Procedure: COLONOSCOPY WITH PROPOFOL;  Surgeon: Lollie Sails, MD;  Location: [facility] ENDOSCOPY;  Service: Endoscopy;  Laterality: N/A;  . PROSTATE SURGERY  2016  . TONSILECTOMY/ADENOIDECTOMY WITH MYRINGOTOMY    . TONSILLECTOMY      Prior to Admission medications   Medication Sig Start Date End Date Taking? Authorizing Provider  buPROPion (WELLBUTRIN) 75 MG tablet Take 1 tablet (75 mg total) by mouth every morning. 07/05/17   Ursula Alert, MD  calcitRIOL (ROCALTROL) 0.25 MCG capsule Take 1 capsule by mouth daily. 09/20/17   [provider]  calcium citrate-vitamin D (CITRACAL+D) 315-200 MG-UNIT tablet Take 2 tablets by mouth daily.     [provider]  carvedilol (COREG) 12.5 MG tablet Take 12.5 mg by mouth 2 (two) times daily with a meal.  09/20/17   [provider]  cholestyramine light (PREVALITE) 4 g packet Take 4 g by mouth daily as needed (digestion).  11/17/15   [provider]  citalopram (CELEXA) 20 MG tablet Take 20 mg by mouth daily.  06/16/17   [provider]  diltiazem (CARDIZEM) 120 MG tablet Take 120 mg by mouth daily.     [provider]  FEROSUL 325 (65 Fe) MG tablet Take 325 mg by mouth 2 (two) times daily with a meal.  09/07/17   [provider]  fluticasone (FLONASE) 50 MCG/ACT nasal spray Place 1  spray into both nostrils daily as needed for allergies.  10/06/16   [provider]  furosemide (LASIX) 40 MG tablet Take 40 mg by mouth daily.  09/20/17   [provider]  HYDROcodone-acetaminophen (NORCO/VICODIN) 5-325 MG tablet 1 or 2 tabs PO q6 hours prn pain 10/14/17   Francine Graven, DO  insulin aspart (NOVOLOG) 100 UNIT/ML FlexPen Inject 60 Units into the skin 3 (three) times daily. Patient taking differently: Inject 0-300 Units into the skin continuous. Insulin pump 08/19/16 10/14/17  Bettey Costa, MD  lisinopril (PRINIVIL,ZESTRIL) 20 MG tablet Take 20 mg by mouth daily.  10/04/17   [provider]  loratadine (CLARITIN) 10 MG tablet Take 10 mg by mouth daily.    [provider]  metFORMIN (GLUCOPHAGE) 500 MG tablet Take 1,000 mg by mouth 2 (two) times daily with a meal.    [provider]  metoCLOPramide (REGLAN) 5 MG tablet Take 5 mg by mouth 4 (four) times daily -  before meals and at bedtime.  10/13/17   [provider]  ondansetron (ZOFRAN-ODT) 4 MG disintegrating tablet Take 4 mg by mouth every 8 (eight) hours as needed for nausea or vomiting.  10/13/17   [provider]  pantoprazole (PROTONIX) 40 MG tablet Take 40 mg by mouth 2 (two) times daily.    [provider]  rosuvastatin (CRESTOR) 40 MG tablet Take 40 mg by mouth daily.  05/26/17   [provider]  tamsulosin (FLOMAX) 0.4 MG CAPS capsule Take 0.4 mg by mouth daily after breakfast.     [provider]  traZODone (DESYREL) 50 MG tablet Take 0.5-1 tablets (25-50 mg total) by mouth at bedtime as needed for sleep. 07/05/17   Ursula Alert, MD    Allergies Levofloxacin; Phenergan [promethazine hcl]; Malt; and Soy allergy  Family History  Problem Relation Age of Onset  . CAD Father   . Stroke Father   . Diabetes Mellitus II Mother   . Kidney failure Mother   . Schizophrenia Mother     Social History Social History   Tobacco Use  .  Smoking status: Never Smoker  . Smokeless tobacco: Never Used  Substance Use Topics  . Alcohol use: No  . Drug use: No    Review of Systems Constitutional: No fever/chills Eyes: No visual changes. ENT: No sore throat. Cardiovascular: Denies chest pain. Respiratory: Denies shortness of breath. Gastrointestinal: Positive for abdominal pain, nausea and vomiting.  Genitourinary: Negative for dysuria. Musculoskeletal: Negative for back pain. Skin: Negative for rash. Neurological: Negative for headaches, focal weakness or numbness.  ____________________________________________   PHYSICAL EXAM:  VITAL SIGNS: ED Triage Vitals  Enc Vitals Group     BP 11/07/17 1550 139/84     Pulse Rate 11/07/17 1550 78     Resp 11/07/17 1550 16     Temp 11/07/17 1550 98.5 F (36.9 C)  Temp Source 11/07/17 1550 Oral     SpO2 11/07/17 1550 98 %     Weight 11/07/17 1551 229 lb (103.9 kg)     Height 11/07/17 1551 6\' 3"  (1.905 m)     Head Circumference --      Peak Flow --      Pain Score 11/07/17 1551 10    Constitutional: Alert and oriented.  Eyes: Conjunctivae are normal.  ENT      Head: Normocephalic and atraumatic.      Nose: No congestion/rhinnorhea.      Mouth/Throat: Mucous membranes are moist.      Neck: No stridor. Hematological/Lymphatic/Immunilogical: No cervical lymphadenopathy. Cardiovascular: Normal rate, regular rhythm.  No murmurs, rubs, or gallops. Respiratory: Normal respiratory effort without tachypnea nor retractions. Breath sounds are clear and equal bilaterally. No wheezes/rales/rhonchi. Gastrointestinal: Soft and tender to palpation in the upper abdomen. No rebound. No guarding.  Genitourinary: Deferred Musculoskeletal: Normal range of motion in all extremities. No lower extremity edema. Neurologic:  Normal speech and language. No gross focal neurologic deficits are appreciated.  Skin:  Skin is warm, dry and intact. No rash noted. Psychiatric: Mood and affect are  normal. Speech and behavior are normal. Patient exhibits appropriate insight and judgment.  ____________________________________________    LABS (pertinent positives/negatives)  Lipase 23 CBC wbc 7.7, hgb 10.0, plt 275 CMP na 138, k 3.4, cr 2.67 UA cloudy, 0-5 RBC, WBC ____________________________________________   EKG  None  ____________________________________________    RADIOLOGY  None   ____________________________________________   PROCEDURES  Procedures  ____________________________________________   INITIAL IMPRESSION / ASSESSMENT AND PLAN / ED COURSE  Pertinent labs & imaging results that were available during my care of the patient were reviewed by me and considered in my medical decision making (see chart for details).   Patient presented to the emergency department today because of concern for continued nausea vomiting and abdominal pain. This is a chronic issue for the patient. Is followed by GI. Blood work today without concerning leukocytosis. Creatinine is elevated, however patient with baseline elevation. Patient denies any fevers. Will give patient prescription for reglan and bentyl. Discussed importance of follow up with GI.  ____________________________________________   FINAL CLINICAL IMPRESSION(S) / ED DIAGNOSES  Final diagnoses:  Left upper quadrant pain  Nausea and vomiting, intractability of vomiting not specified, unspecified vomiting type     Note: This dictation was prepared with Dragon dictation. Any transcriptional errors that result from this process are unintentional     Nance Pear, MD 11/07/17 2116

## 2017-11-07 NOTE — Discharge Instructions (Addendum)
Please seek medical attention for any high fevers, chest pain, shortness of breath, change in behavior, persistent vomiting, bloody stool or any other new or concerning symptoms.  

## 2017-11-07 NOTE — ED Notes (Signed)
Pt reports previously scheduled Endoscopy for today; procedure was canceled due to uncontrolled blood sugar per pt report.

## 2017-11-08 ENCOUNTER — Inpatient Hospital Stay
Admission: AD | Admit: 2017-11-08 | Discharge: 2017-11-10 | DRG: 291 | Disposition: A | Discharge status: Home / Self Care | Payer: BC Managed Care – PPO | Attending: Internal Medicine | Admitting: Internal Medicine

## 2017-11-08 ENCOUNTER — Inpatient Hospital Stay: Payer: BC Managed Care – PPO

## 2017-11-08 DIAGNOSIS — F419 Anxiety disorder, unspecified: Secondary | ICD-10-CM | POA: Diagnosis present

## 2017-11-08 DIAGNOSIS — Z89422 Acquired absence of other left toe(s): Secondary | ICD-10-CM

## 2017-11-08 DIAGNOSIS — E1143 Type 2 diabetes mellitus with diabetic autonomic (poly)neuropathy: Secondary | ICD-10-CM | POA: Diagnosis present

## 2017-11-08 DIAGNOSIS — E1122 Type 2 diabetes mellitus with diabetic chronic kidney disease: Secondary | ICD-10-CM | POA: Diagnosis present

## 2017-11-08 DIAGNOSIS — K3184 Gastroparesis: Secondary | ICD-10-CM | POA: Diagnosis present

## 2017-11-08 DIAGNOSIS — I130 Hypertensive heart and chronic kidney disease with heart failure and stage 1 through stage 4 chronic kidney disease, or unspecified chronic kidney disease: Secondary | ICD-10-CM | POA: Diagnosis present

## 2017-11-08 DIAGNOSIS — E876 Hypokalemia: Secondary | ICD-10-CM | POA: Diagnosis not present

## 2017-11-08 DIAGNOSIS — Z91018 Allergy to other foods: Secondary | ICD-10-CM

## 2017-11-08 DIAGNOSIS — G8929 Other chronic pain: Secondary | ICD-10-CM | POA: Diagnosis present

## 2017-11-08 DIAGNOSIS — K219 Gastro-esophageal reflux disease without esophagitis: Secondary | ICD-10-CM | POA: Diagnosis present

## 2017-11-08 DIAGNOSIS — N400 Enlarged prostate without lower urinary tract symptoms: Secondary | ICD-10-CM | POA: Diagnosis present

## 2017-11-08 DIAGNOSIS — Z888 Allergy status to other drugs, medicaments and biological substances status: Secondary | ICD-10-CM

## 2017-11-08 DIAGNOSIS — E785 Hyperlipidemia, unspecified: Secondary | ICD-10-CM | POA: Diagnosis present

## 2017-11-08 DIAGNOSIS — E1165 Type 2 diabetes mellitus with hyperglycemia: Secondary | ICD-10-CM | POA: Diagnosis present

## 2017-11-08 DIAGNOSIS — N2581 Secondary hyperparathyroidism of renal origin: Secondary | ICD-10-CM | POA: Diagnosis present

## 2017-11-08 DIAGNOSIS — Z794 Long term (current) use of insulin: Secondary | ICD-10-CM | POA: Diagnosis not present

## 2017-11-08 DIAGNOSIS — Z23 Encounter for immunization: Secondary | ICD-10-CM | POA: Diagnosis not present

## 2017-11-08 DIAGNOSIS — N183 Chronic kidney disease, stage 3 (moderate): Secondary | ICD-10-CM | POA: Diagnosis present

## 2017-11-08 DIAGNOSIS — D631 Anemia in chronic kidney disease: Secondary | ICD-10-CM | POA: Diagnosis present

## 2017-11-08 DIAGNOSIS — I5033 Acute on chronic diastolic (congestive) heart failure: Secondary | ICD-10-CM | POA: Diagnosis present

## 2017-11-08 DIAGNOSIS — Z79899 Other long term (current) drug therapy: Secondary | ICD-10-CM

## 2017-11-08 DIAGNOSIS — N179 Acute kidney failure, unspecified: Secondary | ICD-10-CM | POA: Diagnosis present

## 2017-11-08 DIAGNOSIS — R296 Repeated falls: Secondary | ICD-10-CM | POA: Diagnosis present

## 2017-11-08 DIAGNOSIS — Z833 Family history of diabetes mellitus: Secondary | ICD-10-CM

## 2017-11-08 DIAGNOSIS — R0602 Shortness of breath: Secondary | ICD-10-CM | POA: Diagnosis present

## 2017-11-08 LAB — GLUCOSE, CAPILLARY
GLUCOSE-CAPILLARY: 303 mg/dL — AB (ref 70–99)
GLUCOSE-CAPILLARY: 193 mg/dL — AB (ref 70–99)

## 2017-11-08 LAB — COMPREHENSIVE METABOLIC PANEL
ALT: 19 U/L (ref 0–44)
AST: 19 U/L (ref 15–41)
Albumin: 2.1 g/dL — ABNORMAL LOW (ref 3.5–5.0)
Alkaline Phosphatase: 74 U/L (ref 38–126)
Anion gap: 6 (ref 5–15)
BUN: 30 mg/dL — AB (ref 6–20)
CO2: 31 mmol/L (ref 22–32)
CREATININE: 2.53 mg/dL — AB (ref 0.61–1.24)
Calcium: 8 mg/dL — ABNORMAL LOW (ref 8.9–10.3)
Chloride: 98 mmol/L (ref 98–111)
GFR calc Af Amer: 33 mL/min — ABNORMAL LOW (ref >60)
GFR calc non Af Amer: 28 mL/min — ABNORMAL LOW (ref >60)
Glucose, Bld: 394 mg/dL — ABNORMAL HIGH (ref 70–99)
POTASSIUM: 3.5 mmol/L (ref 3.5–5.1)
Sodium: 135 mmol/L (ref 135–145)
TOTAL PROTEIN: 5.1 g/dL — AB (ref 6.5–8.1)
Total Bilirubin: 0.7 mg/dL (ref 0.3–1.2)

## 2017-11-08 LAB — CBC
HCT: 30 % — ABNORMAL LOW (ref 40.0–52.0)
Hemoglobin: 9.8 g/dL — ABNORMAL LOW (ref 13.0–18.0)
MCH: 23.5 pg — ABNORMAL LOW (ref 26.0–34.0)
MCHC: 32.8 g/dL (ref 32.0–36.0)
MCV: 71.7 fL — AB (ref 80.0–100.0)
PLATELETS: 247 10*3/uL (ref 150–440)
RBC: 4.18 MIL/uL — AB (ref 4.40–5.90)
RDW: 14.7 % — ABNORMAL HIGH (ref 11.5–14.5)
WBC: 7.4 10*3/uL (ref 3.8–10.6)

## 2017-11-08 MED ORDER — LORATADINE 10 MG PO TABS
10.0000 mg | ORAL_TABLET | Freq: Every day | ORAL | Status: DC
  Administered 2017-11-10: 10 mg via ORAL
  Filled 2017-11-08 (×3): qty 1

## 2017-11-08 MED ORDER — PANTOPRAZOLE SODIUM 40 MG PO TBEC
40.0000 mg | DELAYED_RELEASE_TABLET | Freq: Two times a day (BID) | ORAL | Status: DC
  Administered 2017-11-08 – 2017-11-10 (×4): 40 mg via ORAL
  Filled 2017-11-08 (×4): qty 1

## 2017-11-08 MED ORDER — CALCITRIOL 0.25 MCG PO CAPS
0.2500 ug | ORAL_CAPSULE | Freq: Every day | ORAL | Status: DC
  Administered 2017-11-09 – 2017-11-10 (×2): 0.25 ug via ORAL
  Filled 2017-11-08 (×3): qty 1

## 2017-11-08 MED ORDER — LABETALOL HCL 5 MG/ML IV SOLN
10.0000 mg | INTRAVENOUS | Status: DC | PRN
  Administered 2017-11-08 – 2017-11-09 (×4): 10 mg via INTRAVENOUS
  Filled 2017-11-08 (×3): qty 4

## 2017-11-08 MED ORDER — ACETAMINOPHEN 325 MG PO TABS: 650.0000 mg | ORAL_TABLET | Freq: Four times a day (QID) | ORAL | Status: DC | PRN

## 2017-11-08 MED ORDER — INSULIN ASPART 100 UNIT/ML ~~LOC~~ SOLN
0.0000 [IU] | Freq: Three times a day (TID) | SUBCUTANEOUS | Status: DC
  Administered 2017-11-08: 7 [IU] via SUBCUTANEOUS
  Administered 2017-11-09: 2 [IU] via SUBCUTANEOUS
  Administered 2017-11-09: 5 [IU] via SUBCUTANEOUS
  Administered 2017-11-09 – 2017-11-10 (×2): 3 [IU] via SUBCUTANEOUS
  Filled 2017-11-08 (×5): qty 1

## 2017-11-08 MED ORDER — FUROSEMIDE 10 MG/ML IJ SOLN
80.0000 mg | Freq: Two times a day (BID) | INTRAMUSCULAR | Status: DC
  Administered 2017-11-08 – 2017-11-09 (×2): 80 mg via INTRAVENOUS
  Filled 2017-11-08 (×2): qty 8

## 2017-11-08 MED ORDER — BUPROPION HCL 75 MG PO TABS
75.0000 mg | ORAL_TABLET | Freq: Every morning | ORAL | Status: DC
  Administered 2017-11-09 – 2017-11-10 (×2): 75 mg via ORAL
  Filled 2017-11-08 (×2): qty 1

## 2017-11-08 MED ORDER — INSULIN ASPART 100 UNIT/ML ~~LOC~~ SOLN
0.0000 [IU] | Freq: Every day | SUBCUTANEOUS | Status: DC
  Administered 2017-11-09: 2 [IU] via SUBCUTANEOUS
  Filled 2017-11-08: qty 1

## 2017-11-08 MED ORDER — TAMSULOSIN HCL 0.4 MG PO CAPS
0.4000 mg | ORAL_CAPSULE | Freq: Every day | ORAL | Status: DC
  Administered 2017-11-09 – 2017-11-10 (×2): 0.4 mg via ORAL
  Filled 2017-11-08 (×2): qty 1

## 2017-11-08 MED ORDER — ONDANSETRON HCL 4 MG/2ML IJ SOLN: 4.0000 mg | Freq: Four times a day (QID) | INTRAMUSCULAR | Status: DC | PRN

## 2017-11-08 MED ORDER — METOCLOPRAMIDE HCL 5 MG/ML IJ SOLN
10.0000 mg | Freq: Four times a day (QID) | INTRAMUSCULAR | Status: DC
  Administered 2017-11-08 – 2017-11-10 (×7): 10 mg via INTRAVENOUS
  Filled 2017-11-08 (×7): qty 2

## 2017-11-08 MED ORDER — LISINOPRIL 20 MG PO TABS
20.0000 mg | ORAL_TABLET | Freq: Every day | ORAL | Status: DC
  Administered 2017-11-09 – 2017-11-10 (×2): 20 mg via ORAL
  Filled 2017-11-08 (×2): qty 1

## 2017-11-08 MED ORDER — LABETALOL HCL 5 MG/ML IV SOLN
INTRAVENOUS | Status: AC
  Filled 2017-11-08: qty 4

## 2017-11-08 MED ORDER — HYDROCODONE-ACETAMINOPHEN 5-325 MG PO TABS: 1.0000 | ORAL_TABLET | ORAL | Status: DC | PRN

## 2017-11-08 MED ORDER — ACETAMINOPHEN 650 MG RE SUPP: 650.0000 mg | Freq: Four times a day (QID) | RECTAL | Status: DC | PRN

## 2017-11-08 MED ORDER — HEPARIN SODIUM (PORCINE) 5000 UNIT/ML IJ SOLN
5000.0000 [IU] | Freq: Three times a day (TID) | INTRAMUSCULAR | Status: DC
  Administered 2017-11-08 – 2017-11-10 (×5): 5000 [IU] via SUBCUTANEOUS
  Filled 2017-11-08 (×5): qty 1

## 2017-11-08 MED ORDER — DILTIAZEM HCL ER COATED BEADS 120 MG PO CP24
120.0000 mg | ORAL_CAPSULE | Freq: Every day | ORAL | Status: DC
  Administered 2017-11-09 – 2017-11-10 (×2): 120 mg via ORAL
  Filled 2017-11-08 (×2): qty 1

## 2017-11-08 MED ORDER — ROSUVASTATIN CALCIUM 10 MG PO TABS
40.0000 mg | ORAL_TABLET | Freq: Every day | ORAL | Status: DC
  Administered 2017-11-09 – 2017-11-10 (×2): 40 mg via ORAL
  Filled 2017-11-08 (×2): qty 4

## 2017-11-08 MED ORDER — INFLUENZA VAC SPLIT QUAD 0.5 ML IM SUSY
0.5000 mL | PREFILLED_SYRINGE | INTRAMUSCULAR | Status: AC
  Administered 2017-11-09: 0.5 mL via INTRAMUSCULAR
  Filled 2017-11-08: qty 0.5

## 2017-11-08 MED ORDER — FERROUS SULFATE 325 (65 FE) MG PO TABS
325.0000 mg | ORAL_TABLET | Freq: Two times a day (BID) | ORAL | Status: DC
  Administered 2017-11-08 – 2017-11-10 (×4): 325 mg via ORAL
  Filled 2017-11-08 (×4): qty 1

## 2017-11-08 MED ORDER — ONDANSETRON HCL 4 MG PO TABS: 4.0000 mg | ORAL_TABLET | Freq: Four times a day (QID) | ORAL | Status: DC | PRN

## 2017-11-08 MED ORDER — DICYCLOMINE HCL 20 MG PO TABS
20.0000 mg | ORAL_TABLET | Freq: Three times a day (TID) | ORAL | Status: DC | PRN
  Administered 2017-11-09: 20 mg via ORAL
  Filled 2017-11-08 (×2): qty 1

## 2017-11-08 MED ORDER — CHOLESTYRAMINE LIGHT 4 G PO PACK
4.0000 g | PACK | Freq: Every day | ORAL | Status: DC | PRN
  Filled 2017-11-08: qty 1

## 2017-11-08 MED ORDER — CARVEDILOL 12.5 MG PO TABS
12.5000 mg | ORAL_TABLET | Freq: Two times a day (BID) | ORAL | Status: DC
  Administered 2017-11-08 – 2017-11-10 (×4): 12.5 mg via ORAL
  Filled 2017-11-08 (×4): qty 1

## 2017-11-08 MED ORDER — CALCIUM CITRATE-VITAMIN D 500-500 MG-UNIT PO CHEW
2.0000 | CHEWABLE_TABLET | Freq: Every day | ORAL | Status: DC
  Administered 2017-11-09 – 2017-11-10 (×2): 2 via ORAL
  Filled 2017-11-08 (×3): qty 2

## 2017-11-08 MED ORDER — PNEUMOCOCCAL VAC POLYVALENT 25 MCG/0.5ML IJ INJ
0.5000 mL | INJECTION | INTRAMUSCULAR | Status: AC
  Administered 2017-11-09: 0.5 mL via INTRAMUSCULAR
  Filled 2017-11-08: qty 0.5

## 2017-11-08 MED ORDER — INSULIN GLARGINE 100 UNIT/ML ~~LOC~~ SOLN
10.0000 [IU] | Freq: Every day | SUBCUTANEOUS | Status: DC
  Administered 2017-11-08 – 2017-11-09 (×2): 10 [IU] via SUBCUTANEOUS
  Filled 2017-11-08 (×3): qty 0.1

## 2017-11-08 MED ORDER — CITALOPRAM HYDROBROMIDE 20 MG PO TABS
20.0000 mg | ORAL_TABLET | Freq: Every day | ORAL | Status: DC
  Administered 2017-11-09 – 2017-11-10 (×2): 20 mg via ORAL
  Filled 2017-11-08 (×2): qty 1

## 2017-11-08 MED ORDER — LOPERAMIDE HCL 2 MG PO CAPS
2.0000 mg | ORAL_CAPSULE | Freq: Four times a day (QID) | ORAL | Status: DC | PRN
  Administered 2017-11-08: 2 mg via ORAL
  Filled 2017-11-08: qty 1

## 2017-11-08 NOTE — H&P (Signed)
[name] NAME: [name]    MR#:  [id number]  DATE OF BIRTH:  08-12-1970  DATE OF ADMISSION:  11/08/2017  PRIMARY CARE PHYSICIAN: [name], MD   REQUESTING/REFERRING PHYSICIAN: Dr. Lavonia Dana  CHIEF COMPLAINT:  No chief complaint on file. Abdominal pain, 20 lb weight gain.   HISTORY OF PRESENT ILLNESS:  [name]  is a 47 y.o. male with a known history of diabetes, hypertension, hyperlipidemia, diabetic neuropathy, chronic kidney disease stage III, anxiety, ongoing nausea vomiting abdominal pain suspected to be secondary to diabetic gastroparesis who presents to the hospital through a direct admission due to intermittent shortness of breath, 20 pound weight gain and worsening lower extremity edema.  Patient apparently has been having chronic abdominal pain with nausea vomiting which is being worked up by gastroenterology as an outpatient and currently being treated for diabetic gastroparesis, but today he had a follow-up with his nephrologist who noted worsening lower extremity edema and a 20 pound weight gain over the past few weeks despite being on diuretics and increasing his diuretic dose.  He was also noted to be in acute kidney injury and therefore nephrologist recommended direct admission and to be started on high-dose IV diuretics.  Patient denies any chest pains, fever, chills, dysuria, hematuria, numbness tingling or any other associated symptoms.  PAST MEDICAL HISTORY:   Past Medical History:  Diagnosis Date  . Anxiety   . Cellulitis   . Chronic diarrhea   . Chronic kidney disease   . Chronic pain of both ankles   . Diabetes mellitus without complication ([place])   . Diabetes mellitus, type II ([name])   . Diabetic neuropathy ([place])   . Frequent falls   . Gait instability   . Heart palpitations   . HLD (hyperlipidemia)   . Hypertension   . Insomnia   . Nausea and vomiting in adult    recurrent     PAST SURGICAL HISTORY:   Past Surgical History:  Procedure Laterality Date  . AMPUTATION Left 03/16/2016   Procedure: AMPUTATION DIGIT LEFT HALLUX;  Surgeon: Trula Slade, DPM;  Location: [place];  Service: Podiatry;  Laterality: Left;  can start around 5   . ARTHROSCOPIC REPAIR ACL Left   . COLONOSCOPY WITH PROPOFOL N/A 10/28/2015   Procedure: COLONOSCOPY WITH PROPOFOL;  Surgeon: Lollie Sails, MD;  Location[facility] ENDOSCOPY;  Service: Endoscopy;  Laterality: N/A;  . COLONOSCOPY WITH PROPOFOL N/A 10/29/2015   Procedure: COLONOSCOPY WITH PROPOFOL;  Surgeon: Lollie Sails, MD;  Location: [facility] ENDOSCOPY;  Service: Endoscopy;  Laterality: N/A;  . PROSTATE SURGERY  2016  . TONSILECTOMY/ADENOIDECTOMY WITH MYRINGOTOMY    . TONSILLECTOMY      SOCIAL HISTORY:   Social History   Tobacco Use  . Smoking status: Never Smoker  . Smokeless tobacco: Never Used  Substance Use Topics  . Alcohol use: No    FAMILY HISTORY:   Family History  Problem Relation Age of Onset  . CAD Father   . Stroke Father   . Diabetes Mellitus II Mother   . Kidney failure Mother   . Schizophrenia Mother     DRUG ALLERGIES:   Allergies  Allergen Reactions  . Levofloxacin Swelling  . Phenergan [Promethazine Hcl] Other (See Comments)    Cramping   . Malt     Other reaction(s): OTHER  . Soy Allergy Other (See Comments)    Other reaction(s): OTHER    REVIEW OF  SYSTEMS:   Review of Systems  Constitutional: Negative for fever and weight loss.  HENT: Negative for congestion, nosebleeds and tinnitus.   Eyes: Negative for blurred vision, double vision and redness.  Respiratory: Positive for shortness of breath. Negative for cough and hemoptysis.   Cardiovascular: Positive for leg swelling. Negative for chest pain, orthopnea and PND.  Gastrointestinal: Positive for abdominal pain, nausea and vomiting. Negative for diarrhea and melena.  Genitourinary: Negative for dysuria, hematuria and urgency.   Musculoskeletal: Negative for falls and joint pain.  Neurological: Negative for dizziness, tingling, sensory change, focal weakness, seizures, weakness and headaches.  Endo/Heme/Allergies: Negative for polydipsia. Does not bruise/bleed easily.  Psychiatric/Behavioral: Negative for depression and memory loss. The patient is not nervous/anxious.     MEDICATIONS AT HOME:   Prior to Admission medications   Medication Sig Start Date End Date Taking? Authorizing Provider  buPROPion (WELLBUTRIN) 75 MG tablet Take 1 tablet (75 mg total) by mouth every morning. 07/05/17   Ursula Alert, MD  calcitRIOL (ROCALTROL) 0.25 MCG capsule Take 1 capsule by mouth daily. 09/20/17   [provider]  calcium citrate-vitamin D (CITRACAL+D) 315-200 MG-UNIT tablet Take 2 tablets by mouth daily.     [provider]  carvedilol (COREG) 12.5 MG tablet Take 12.5 mg by mouth 2 (two) times daily with a meal.  09/20/17   [provider]  cholestyramine light (PREVALITE) 4 g packet Take 4 g by mouth daily as needed (digestion).  11/17/15   [provider]  citalopram (CELEXA) 20 MG tablet Take 20 mg by mouth daily.  06/16/17   [provider]  dicyclomine (BENTYL) 20 MG tablet Take 1 tablet (20 mg total) by mouth 3 (three) times daily as needed (abdominal pain). 11/07/17   Nance Pear, MD  diltiazem (CARDIZEM) 120 MG tablet Take 120 mg by mouth daily.     [provider]  FEROSUL 325 (65 Fe) MG tablet Take 325 mg by mouth 2 (two) times daily with a meal.  09/07/17   [provider]  fluticasone (FLONASE) 50 MCG/ACT nasal spray Place 1 spray into both nostrils daily as needed for allergies.  10/06/16   [provider]  furosemide (LASIX) 40 MG tablet Take 40 mg by mouth daily.  09/20/17   [provider]  HYDROcodone-acetaminophen (NORCO/VICODIN) 5-325 MG tablet 1 or 2 tabs PO q6 hours prn pain 10/14/17   Francine Graven, DO  insulin aspart  (NOVOLOG) 100 UNIT/ML FlexPen Inject 60 Units into the skin 3 (three) times daily. Patient taking differently: Inject 0-300 Units into the skin continuous. Insulin pump 08/19/16 10/14/17  Bettey Costa, MD  lisinopril (PRINIVIL,ZESTRIL) 20 MG tablet Take 20 mg by mouth daily.  10/04/17   [provider]  loratadine (CLARITIN) 10 MG tablet Take 10 mg by mouth daily.    [provider]  metFORMIN (GLUCOPHAGE) 500 MG tablet Take 1,000 mg by mouth 2 (two) times daily with a meal.    [provider]  metoCLOPramide (REGLAN) 10 MG tablet Take 1 tablet (10 mg total) by mouth every 8 (eight) hours as needed for nausea. 11/07/17 11/07/18  Nance Pear, MD  metoCLOPramide (REGLAN) 5 MG tablet Take 5 mg by mouth 4 (four) times daily -  before meals and at bedtime.  10/13/17   [provider]  ondansetron (ZOFRAN-ODT) 4 MG disintegrating tablet Take 4 mg by mouth every 8 (eight) hours as needed for nausea or vomiting.  10/13/17   [provider]  pantoprazole (PROTONIX) 40 MG tablet Take 40 mg by mouth 2 (two) times daily.    [provider]  rosuvastatin (CRESTOR) 40 MG tablet Take 40 mg by mouth daily.  05/26/17   [provider]  tamsulosin (FLOMAX) 0.4 MG CAPS capsule Take 0.4 mg by mouth daily after breakfast.     [provider]  traZODone (DESYREL) 50 MG tablet Take 0.5-1 tablets (25-50 mg total) by mouth at bedtime as needed for sleep. 07/05/17   Ursula Alert, MD      VITAL SIGNS:  Blood pressure (!) 176/106, pulse 85, temperature 98.9 F (37.2 C), temperature source Oral, SpO2 100 %.  PHYSICAL EXAMINATION:  Physical Exam  GENERAL:  47 y.o.-year-old patient lying in the bed in no acute distress.  EYES: Pupils equal, round, reactive to light and accommodation. No scleral icterus. Extraocular muscles intact.  HEENT: Head atraumatic, normocephalic. Oropharynx and nasopharynx clear. No oropharyngeal erythema, moist oral mucosa  NECK:   Supple, no jugular venous distention. No thyroid enlargement, no tenderness.  LUNGS: Normal breath sounds bilaterally, no wheezing, rales, rhonchi. No use of accessory muscles of respiration.  CARDIOVASCULAR: S1, S2 RRR. No murmurs, rubs, gallops, clicks.  ABDOMEN: Soft, nontender, nondistended. Bowel sounds present. No organomegaly or mass.  EXTREMITIES: +2 pedal edema b/l, No cyanosis, or clubbing. + 2 pedal & radial pulses b/l.   NEUROLOGIC: Cranial nerves II through XII are intact. No focal Motor or sensory deficits appreciated b/l. PSYCHIATRIC: The patient is alert and oriented x 3.  SKIN: No obvious rash, lesion, or ulcer.   LABORATORY PANEL:   CBC Recent Labs  Lab 11/07/17 1555  WBC 7.7  HGB 10.0*  HCT 30.3*  PLT 275   ------------------------------------------------------------------------------------------------------------------  Chemistries  Recent Labs  Lab 11/07/17 1555  NA 138  K 3.4*  CL 100  CO2 32  GLUCOSE 218*  BUN 36*  CREATININE 2.67*  CALCIUM 8.0*  AST 24  ALT 22  ALKPHOS 83  BILITOT 0.7   ------------------------------------------------------------------------------------------------------------------  Cardiac Enzymes No results for input(s): TROPONINI in the last 168 hours. ------------------------------------------------------------------------------------------------------------------  RADIOLOGY:  No results found.   IMPRESSION AND PLAN:   47 year old male with past medical history of diabetes, chronic kidney disease stage III, hypertension, hyperlipidemia, abdominal pain/nausea vomiting who presents to the hospital due to 20 pound weight gain, shortness of breath.  1.  Volume overload/lower extremity edema- suspected to be secondary to worsening acute kidney injury.  Patient has apparently had a 20 pound weight gain over the past 2 weeks.  He has not responded to oral diuretics which have been doubled. - We will admit the patient to the  hospitalist, start him on high-dose IV Lasix, follow I's and O's and daily weights.  Follow response to high-dose diuretics.  2.  Acute on chronic renal failure-patient's baseline creatinines is on close to 2 currently elevated at 2.6.  Patient has underlying nephrotic range proteinuria and CKD secondary to diabetic nephropathy.  Patient now has volume overload as mentioned above with the 20 pound weight gain and lower extremity edema.  We will start the patient on high-dose IV diuretics and follow response. -If not improving consider possible dialysis.  Will consult nephrology.  3.  Abdominal pain/nausea/vomiting-suspected to be secondary to underlying suspected diabetic gastroparesis.  Patient is currently being worked up as an outpatient by gastroenterology.  I will continue his scheduled Reglan, continue Bentyl.  4.  Essential hypertension-continue carvedilol, Cardizem, lisinopril  5.  Diabetes type 2 with renal complications-  hold patient's metformin, continue sliding scale insulin for now.  Follow blood sugars.  6.  BPH-continue Flomax.  7.  Hyperlipidemia-continue Crestor.  8.  GERD-continue Protonix.    All the records are reviewed and case discussed with ED provider. Management plans discussed with the patient, family and they are in agreement.  CODE STATUS: Full code  TOTAL TIME TAKING CARE OF THIS PATIENT: 45 minutes.    Henreitta Leber M.D on 11/08/2017 at 4:54 PM  Between 7am to 6pm - Pager - [phone]  After 6pm go to [email or web address] - password [email or web address] [facility]  [facility]  Office  [phone]  CC: Primary care physician; [name], MD

## 2017-11-09 LAB — GLUCOSE, CAPILLARY
GLUCOSE-CAPILLARY: 193 mg/dL — AB (ref 70–99)
GLUCOSE-CAPILLARY: 219 mg/dL — AB (ref 70–99)
Glucose-Capillary: 321 mg/dL — ABNORMAL HIGH (ref 70–99)
Glucose-Capillary: 211 mg/dL — ABNORMAL HIGH (ref 70–99)

## 2017-11-09 LAB — CBC
HCT: 28.3 % — ABNORMAL LOW (ref 40.0–52.0)
Hemoglobin: 9.3 g/dL — ABNORMAL LOW (ref 13.0–18.0)
MCH: 23.5 pg — AB (ref 26.0–34.0)
MCHC: 32.8 g/dL (ref 32.0–36.0)
MCV: 71.4 fL — ABNORMAL LOW (ref 80.0–100.0)
PLATELETS: 250 10*3/uL (ref 150–440)
RBC: 3.96 MIL/uL — AB (ref 4.40–5.90)
RDW: 14.8 % — ABNORMAL HIGH (ref 11.5–14.5)
WBC: 7.5 10*3/uL (ref 3.8–10.6)

## 2017-11-09 LAB — BASIC METABOLIC PANEL
ANION GAP: 7 (ref 5–15)
BUN: 35 mg/dL — ABNORMAL HIGH (ref 6–20)
CO2: 30 mmol/L (ref 22–32)
CREATININE: 2.24 mg/dL — AB (ref 0.61–1.24)
Calcium: 7.9 mg/dL — ABNORMAL LOW (ref 8.9–10.3)
Chloride: 100 mmol/L (ref 98–111)
GFR, EST AFRICAN AMERICAN: 38 mL/min — AB (ref >60)
GFR, EST NON AFRICAN AMERICAN: 33 mL/min — AB (ref >60)
Glucose, Bld: 210 mg/dL — ABNORMAL HIGH (ref 70–99)
Potassium: 2.9 mmol/L — ABNORMAL LOW (ref 3.5–5.1)
Sodium: 137 mmol/L (ref 135–145)

## 2017-11-09 MED ORDER — INSULIN ASPART 100 UNIT/ML ~~LOC~~ SOLN
3.0000 [IU] | Freq: Three times a day (TID) | SUBCUTANEOUS | Status: DC
  Administered 2017-11-09 – 2017-11-10 (×2): 3 [IU] via SUBCUTANEOUS
  Filled 2017-11-09 (×2): qty 1

## 2017-11-09 MED ORDER — PREMIER PROTEIN SHAKE
11.0000 [oz_av] | Freq: Two times a day (BID) | ORAL | Status: DC
  Administered 2017-11-09 – 2017-11-10 (×2): 11 [oz_av] via ORAL

## 2017-11-09 MED ORDER — POTASSIUM CHLORIDE CRYS ER 20 MEQ PO TBCR
40.0000 meq | EXTENDED_RELEASE_TABLET | Freq: Two times a day (BID) | ORAL | Status: AC
  Administered 2017-11-09 (×2): 40 meq via ORAL
  Filled 2017-11-09 (×2): qty 2

## 2017-11-09 MED ORDER — FUROSEMIDE 10 MG/ML IJ SOLN
80.0000 mg | Freq: Three times a day (TID) | INTRAMUSCULAR | Status: DC
  Administered 2017-11-09 – 2017-11-10 (×3): 80 mg via INTRAVENOUS
  Filled 2017-11-09 (×3): qty 8

## 2017-11-09 NOTE — Care Management Note (Signed)
Case Management Note  Patient Details  Name: [name]. MRN: [id number] Date of Birth: 06/28/1970  Subjective/Objective:     From home, lives with wife.  New CHF, booklet given to patient.    He has had a 20lb weight gain in the last 2 weeks.  Independent at home with ADL's.  Wife drives him to appointments.  He does not have any services in the home.  On room air.  Denies difficulty affording medications.  Currently diuresing with IV lasix.  Nephrology has consulted.  Patient has a scale in the home and explained the importance of weighing. Has not been weighing self everyday.  Patient does not feel he needs any services at home at this time.   Current with PCP.       Action/Plan:   Expected Discharge Date:                  Expected Discharge Plan:  Home/Self Care  In-House Referral:     Discharge planning Services     Post Acute Care Choice:    Choice offered to:     DME Arranged:    DME Agency:     HH Arranged:    HH Agency:     Status of Service:  In process, will continue to follow  If discussed at Long Length of Stay Meetings, dates discussed:    Additional Comments:  Elza Rafter, RN 11/09/2017, 2:00 PM

## 2017-11-09 NOTE — Progress Notes (Signed)
[facility]  ROUNDING NOTE   Subjective:   Admitted yesterday from clinic due to failure of PO diuretics.   Objective:  Vital signs in last 24 hours:  Temp:  [97.7 F (36.5 C)-98.9 F (37.2 C)] 97.9 F (36.6 C) (09/18 0724) Pulse Rate:  [74-85] 74 (09/18 0930) Resp:  [16-18] 18 (09/18 0724) BP: (154-195)/(88-105) 154/97 (09/18 0930) SpO2:  [96 %-100 %] 98 % (09/18 0930) Weight:  [104.2 kg-106.8 kg] 104.2 kg (09/18 0521)  Weight change:  Filed Weights   11/08/17 1738 11/09/17 0521  Weight: 106.8 kg 104.2 kg    Intake/Output: I/O last 3 completed shifts: In: -  Out: 1075 [Urine:1075]   Intake/Output this shift:  Total I/O In: -  Out: 750 [Urine:750]  Physical Exam: General: NAD,   Head: Normocephalic, atraumatic. Moist oral mucosal membranes  Eyes: Anicteric, PERRL  Neck: Supple, trachea midline  Lungs:  Basilar crackles  Heart: Regular rate and rhythm  Abdomen:  Soft, nontender,   Extremities:  ++ peripheral edema.  Neurologic: Nonfocal, moving all four extremities  Skin: No lesions        Basic Metabolic Panel: Recent Labs  Lab 11/07/17 1555 11/08/17 1638 11/09/17 0302  NA 138 135 137  K 3.4* 3.5 2.9*  CL 100 98 100  CO2 32 31 30  GLUCOSE 218* 394* 210*  BUN 36* 30* 35*  CREATININE 2.67* 2.53* 2.24*  CALCIUM 8.0* 8.0* 7.9*    Liver Function Tests: Recent Labs  Lab 11/07/17 1555 11/08/17 1638  AST 24 19  ALT 22 19  ALKPHOS 83 74  BILITOT 0.7 0.7  PROT 5.5* 5.1*  ALBUMIN 2.3* 2.1*   Recent Labs  Lab 11/07/17 1555  LIPASE 23   No results for input(s): AMMONIA in the last 168 hours.  CBC: Recent Labs  Lab 11/07/17 1555 11/08/17 1638 11/09/17 0302  WBC 7.7 7.4 7.5  HGB 10.0* 9.8* 9.3*  HCT 30.3* 30.0* 28.3*  MCV 70.8* 71.7* 71.4*  PLT 275 247 250    Cardiac Enzymes: No results for input(s): CKTOTAL, CKMB, CKMBINDEX, TROPONINI in the last 168 hours.  BNP: Invalid input(s): POCBNP  CBG: Recent Labs  Lab  11/08/17 1728 11/08/17 2058 11/09/17 0728  GLUCAP 303* 193* 193*    Microbiology: Results for orders placed or performed during the hospital encounter of 01/24/17  Aerobic Culture (superficial specimen)     Status: None   Collection Time: 01/24/17 11:24 AM  Result Value Ref Range Status   Specimen Description WOUND RIGHT LOWER LEG  Final   Special Requests RT LOWER ANTERIOR LEG  Final   Gram Stain   Final    MODERATE WBC PRESENT, PREDOMINANTLY PMN NO ORGANISMS SEEN Performed at [facility], [place] [address], [place], [place] [address]    Culture MODERATE STAPHYLOCOCCUS AUREUS  Final   Report Status 01/27/2017 FINAL  Final   Organism ID, Bacteria STAPHYLOCOCCUS AUREUS  Final      Susceptibility   Staphylococcus aureus - MIC*    CIPROFLOXACIN <=0.5 SENSITIVE Sensitive     ERYTHROMYCIN >=8 RESISTANT Resistant     GENTAMICIN <=0.5 SENSITIVE Sensitive     OXACILLIN 0.5 SENSITIVE Sensitive     TETRACYCLINE <=1 SENSITIVE Sensitive     VANCOMYCIN <=0.5 SENSITIVE Sensitive     TRIMETH/SULFA <=10 SENSITIVE Sensitive     CLINDAMYCIN >=8 RESISTANT Resistant     RIFAMPIN <=0.5 SENSITIVE Sensitive     Inducible Clindamycin NEGATIVE Sensitive     * MODERATE  STAPHYLOCOCCUS AUREUS    Coagulation Studies: No results for input(s): LABPROT, INR in the last 72 hours.  Urinalysis: Recent Labs    11/07/17 1855  COLORURINE YELLOW*  LABSPEC 1.016  PHURINE 5.0  GLUCOSEU >=500*  HGBUR MODERATE*  BILIRUBINUR NEGATIVE  KETONESUR NEGATIVE  PROTEINUR >=300*  NITRITE NEGATIVE  LEUKOCYTESUR NEGATIVE      Imaging: Dg Chest Port 1 View  Result Date: 11/08/2017 CLINICAL DATA:  Shortness of breath and nausea EXAM: PORTABLE CHEST 1 VIEW COMPARISON:  Chest radiograph 10/14/2017 FINDINGS: The heart size and mediastinal contours are within normal limits. Both lungs are clear. The visualized skeletal structures are unremarkable. IMPRESSION: No active disease. Electronically Signed   By: Ulyses Jarred M.D.   On: 11/08/2017 19:30     Medications:    . buPROPion  75 mg Oral q morning - 10a  . calcitRIOL  0.25 mcg Oral Daily  . calcium citrate-vitamin D  2 tablet Oral Daily  . carvedilol  12.5 mg Oral BID WC  . citalopram  20 mg Oral Daily  . diltiazem  120 mg Oral Daily  . ferrous sulfate  325 mg Oral BID WC  . furosemide  80 mg Intravenous Q8H  . heparin  5,000 Units Subcutaneous Q8H  . Influenza vac split quadrivalent PF  0.5 mL Intramuscular Tomorrow-1000  . insulin aspart  0-5 Units Subcutaneous QHS  . insulin aspart  0-9 Units Subcutaneous TID WC  . insulin glargine  10 Units Subcutaneous QHS  . lisinopril  20 mg Oral Daily  . loratadine  10 mg Oral Daily  . metoCLOPramide (REGLAN) injection  10 mg Intravenous Q6H  . pantoprazole  40 mg Oral BID  . pneumococcal 23 valent vaccine  0.5 mL Intramuscular Tomorrow-1000  . potassium chloride  40 mEq Oral BID  . rosuvastatin  40 mg Oral Daily  . tamsulosin  0.4 mg Oral QPC breakfast   acetaminophen **OR** acetaminophen, cholestyramine light, dicyclomine, HYDROcodone-acetaminophen, labetalol, loperamide, ondansetron **OR** ondansetron (ZOFRAN) IV  Assessment/ Plan:  [name]. is a 47 y.o. black male with hypertension, diabetes mellitus type II, diabetic retinopathy, hyperlipidemia, BPH, who presents with acute exacerbation of congestive heart failure.   1. Acute renal failure on Chronic Kidney Disease stage III with nephrotic range proteinuria. Baseline creatinine 2.23, GFR of 29 from 08/30/17 Acute renal failure secondary to acute cardiorenal syndrome.  Chronic kidney disease secondary to diabetic nephropathy.   2. Hypertension with new onset congestive heart failure. History of difficult to control.  - Continue carvedilol, lisinopril, tamsulosin and amlodipine  3. Diabetes mellitus type II with chronic kidney disease: history of poor control.  - Check hemoglobin A1c  4. Anemia: hemoglobin 9.3  -  Discussed ESA with patients.   5. Secondary Hyperparathyroidism:  - Calcitriol.   6. Hypokalemia: secondary diuretics.  - PO potassium chloride.    LOS: 1 [name] 9/18/201910:54 AM

## 2017-11-09 NOTE — Plan of Care (Signed)
  Problem: Clinical Measurements: Goal: Cardiovascular complication will be avoided Outcome: Progressing   Problem: Safety: Goal: Ability to remain free from injury will improve Outcome: Progressing   

## 2017-11-09 NOTE — Progress Notes (Signed)
Patient resting in the chair at this time, denies any pain vss, continue to receivng lasix and dieresis well. No complaint voiced

## 2017-11-09 NOTE — Progress Notes (Signed)
[facility] [place] NAME: [name]    MR#:  [id number]  DATE OF BIRTH:  08/24/1970  SUBJECTIVE:   Patient came in with nausea and intermittent vomiting along with shortness of breath and weight gain of 20 pounds. Feels a little better today. Good urine output. Ate breakfast. No vomiting. REVIEW OF SYSTEMS:   Review of Systems  Constitutional: Negative for chills, fever and weight loss.  HENT: Negative for ear discharge, ear pain and nosebleeds.   Eyes: Negative for blurred vision, pain and discharge.  Respiratory: Negative for sputum production, shortness of breath, wheezing and stridor.   Cardiovascular: Positive for leg swelling. Negative for chest pain, palpitations, orthopnea and PND.  Gastrointestinal: Negative for abdominal pain, diarrhea, nausea and vomiting.  Genitourinary: Negative for frequency and urgency.  Musculoskeletal: Negative for back pain and joint pain.  Neurological: Negative for sensory change, speech change, focal weakness and weakness.  Psychiatric/Behavioral: Negative for depression and hallucinations. The patient is not nervous/anxious.    Tolerating Diet:yes Tolerating PT: ambulatory  DRUG ALLERGIES:   Allergies  Allergen Reactions  . Levofloxacin Swelling  . Phenergan [Promethazine Hcl] Other (See Comments)    Cramping   . Malt     Other reaction(s): OTHER  . Soy Allergy Other (See Comments)    Other reaction(s): OTHER    VITALS:  Blood pressure (!) 154/97, pulse 74, temperature 97.9 F (36.6 C), temperature source Oral, resp. rate 18, height 6\' 3"  (1.905 m), weight 104.2 kg, SpO2 98 %.  PHYSICAL EXAMINATION:   Physical Exam  GENERAL:  47 y.o.-year-old patient lying in the bed with no acute distress.  EYES: Pupils equal, round, reactive to light and accommodation. No scleral icterus. Extraocular muscles intact.  HEENT: Head atraumatic, normocephalic. Oropharynx and nasopharynx  clear.  NECK:  Supple, no jugular venous distention. No thyroid enlargement, no tenderness.  LUNGS: Normal breath sounds bilaterally, no wheezing, rales, rhonchi. No use of accessory muscles of respiration.  CARDIOVASCULAR: S1, S2 normal. No murmurs, rubs, or gallops.  ABDOMEN: Soft, nontender, nondistended. Bowel sounds present. No organomegaly or mass.  EXTREMITIES: No cyanosis, clubbing  ++ edema b/l.    NEUROLOGIC: Cranial nerves II through XII are intact. No focal Motor or sensory deficits b/l.   PSYCHIATRIC:  patient is alert and oriented x 3.  SKIN: No obvious rash, lesion, or ulcer.   LABORATORY PANEL:  CBC Recent Labs  Lab 11/09/17 0302  WBC 7.5  HGB 9.3*  HCT 28.3*  PLT 250    Chemistries  Recent Labs  Lab 11/08/17 1638 11/09/17 0302  NA 135 137  K 3.5 2.9*  CL 98 100  CO2 31 30  GLUCOSE 394* 210*  BUN 30* 35*  CREATININE 2.53* 2.24*  CALCIUM 8.0* 7.9*  AST 19  --   ALT 19  --   ALKPHOS 74  --   BILITOT 0.7  --    Cardiac Enzymes No results for input(s): TROPONINI in the last 168 hours. RADIOLOGY:  Dg Chest Port 1 View  Result Date: 11/08/2017 CLINICAL DATA:  Shortness of breath and nausea EXAM: PORTABLE CHEST 1 VIEW COMPARISON:  Chest radiograph 10/14/2017 FINDINGS: The heart size and mediastinal contours are within normal limits. Both lungs are clear. The visualized skeletal structures are unremarkable. IMPRESSION: No active disease. Electronically Signed   By: Ulyses Jarred M.D.   On: 11/08/2017 19:30   ASSESSMENT AND PLAN:  47 year old male with past medical history of diabetes,  chronic kidney disease stage III, hypertension, hyperlipidemia, abdominal pain/nausea vomiting who presents to the hospital due to 20 pound weight gain, shortness of breath.  1.  Volume overload/lower extremity edema- suspected to be secondary to worsening acute kidney injury.  Patient has apparently had a 20 pound weight gain over the past 2 weeks.  He has not responded to  oral diuretics which have been doubled. -start him on high-dose IV Lasix, follow I's and O's and daily weights.   -Follow response to high-dose diuretics. -Appreciate nephrology consultation with Dr. Juleen China  2.  Acute on chronic renal failure-patient's baseline creatinines is on close to 2 currently elevated at 2.6.   -Patient has underlying nephrotic range proteinuria and CKD secondary to diabetic nephropathy.  Patient now has volume overload as mentioned above with the 20 pound weight gain and lower extremity edema.   - on high-dose IV diuretics and follow response. -If not improving consider possible dialysis.    3.  Abdominal pain/nausea/vomiting-suspected to be secondary to underlying suspected diabetic gastroparesis.  Patient is currently being worked up as an outpatient by gastroenterology.  I will continue his scheduled Reglan, continue Bentyl. -Patient advised small frequent meals instead of big breakfast and skipping lunch. This will help his sugar control and overall his symptoms of nausea and vomiting.  4.  Essential hypertension-continue carvedilol, Cardizem, lisinopril  5.  Diabetes type 2 with renal complications- d/ced patient's metformin, continue sliding scale insulin for now.  Follow blood sugars. -start lantus 10 units daily -check A1c  6.  BPH-continue Flomax.  7.  Hyperlipidemia-continue Crestor.  8.  GERD-continue Protonix.  Case discussed with Care Management/Social Worker. Management plans discussed with the patient, family and they are in agreement.  CODE STATUS: FULL  DVT Prophylaxis: heparin  TOTAL TIME TAKING CARE OF THIS PATIENT: *40* minutes.  >50% time spent on counselling and coordination of care  POSSIBLE D/C IN 1-2 DAYS, DEPENDING ON CLINICAL CONDITION.  Note: This dictation was prepared with Dragon dictation along with smaller phrase technology. Any transcriptional errors that result from this process are unintentional.  Fritzi Mandes  M.D on 11/09/2017 at 10:27 AM  Between 7am to 6pm - Pager - [phone]  After 6pm go to [email or web address] - password [email or web address] [facility]  Office  [phone]  CC: Primary care physician; [name], MDPatient ID: [name] Cooper., male   DOB: [birth year], 47 y.o.   MRN: [id number]

## 2017-11-09 NOTE — Plan of Care (Signed)
Nutrition Education Note  RD consulted for nutrition education regarding gastroparesis    RD provided "Gastroparesis Nutrition Therapy" handout from the Academy of [employer]etics. Reviewed patient's dietary recall. Advised patient to eat multiple small meals, avoid high fat foods, and avoid high fiber foods. Advised patient to chew foods well before swallowing, drink fluids throughout meals, and to sit up or walk after meals if possible. Gave patient examples of meals and menu planning.   Teach back method used.  Expect good compliance.  Body mass index is 28.71 kg/m. Pt meets criteria for overweight based on current BMI.  RD following this pt  Koleen Distance MS, RD, LDN Pager #[phone] Office#- [phone] After Hours Pager: [phone]

## 2017-11-09 NOTE — Progress Notes (Signed)
Initial Nutrition Assessment  DOCUMENTATION CODES:   Not applicable  INTERVENTION:   Premier Protein BID, each supplement provides 160 kcal and 30 grams of protein.   Snacks   Liberalize diet   NUTRITION DIAGNOSIS:   Inadequate oral intake related to acute illness(Gastroparesis ) as evidenced by per patient/family report.  GOAL:   Patient will meet greater than or equal to 90% of their needs  MONITOR:   PO intake, Supplement acceptance, Labs, Weight trends, Skin, I & O's  REASON FOR ASSESSMENT:   Malnutrition Screening Tool    ASSESSMENT:   47 y.o. male with a known history of diabetes, hypertension, hyperlipidemia, diabetic neuropathy, chronic kidney disease stage III, anxiety, ongoing nausea vomiting abdominal pain suspected to be secondary to diabetic gastroparesis who presents to the hospital through a direct admission due to intermittent shortness of breath, 20 pound weight gain and worsening lower extremity edema.    Met with pt in room today. Pt reports good appetite pta but reports having early satiety and nausea/vomiting frequently after meals. Pt with suspected gastroparesis and is being followed by GI. Pt reports wt gain secondary to edema. Pt reports his UBW is around 225lbs. Pt reports eating well at breakfast this morning followed by early satiety. RD provided pt with gastroparesis education today. Will order snacks and supplements to maximize nutrition. Will liberalize pt's diet as his potassium is low; will restrict sodium.    Medications reviewed and include: calcitriol, Ca citrate- vit D, celexa, ferrous sulfate, lasix, heparin, insulin, reglan, protonix, KCl  Labs reviewed: K 2.9(L), BUN 35(H), creat 2.24(H) Hgb 9.3(L), Hct 28.3(L), MCV 71.4(L), MCH 23.5(L) cbgs- 303, 193, 193, 219 x 24 hrs  NUTRITION - FOCUSED PHYSICAL EXAM:    Most Recent Value  Orbital Region  No depletion  Upper Arm Region  No depletion  Thoracic and Lumbar Region  No depletion   Buccal Region  No depletion  Temple Region  No depletion  Clavicle Bone Region  No depletion  Clavicle and Acromion Bone Region  No depletion  Scapular Bone Region  No depletion  Dorsal Hand  No depletion  Patellar Region  No depletion  Anterior Thigh Region  No depletion  Posterior Calf Region  No depletion  Edema (RD Assessment)  Moderate  Hair  Reviewed  Eyes  Reviewed  Mouth  Reviewed  Skin  Reviewed  Nails  Reviewed     Diet Order:   Diet Order            Diet renal/carb modified with fluid restriction Diet-HS Snack? Nothing; Fluid restriction: 1200 mL Fluid; Room service appropriate? Yes; Fluid consistency: Thin  Diet effective now             EDUCATION NEEDS:   Education needs have been addressed  Skin:  Skin Assessment: Reviewed RN Assessment  Last BM:  9/17- type 7  Height:   Ht Readings from Last 1 Encounters:  11/08/17 6' 3"  (1.905 m)    Weight:   Wt Readings from Last 1 Encounters:  11/09/17 104.2 kg    Ideal Body Weight:  89 kg  BMI:  Body mass index is 28.71 kg/m.  Estimated Nutritional Needs:   Kcal:  2400-2700kcal/day   Protein:  104-115g/day   Fluid:  >2.5L/day or per MD  Koleen Distance MS, RD, LDN Pager #- [phone] Office#- [phone] After Hours Pager: [phone]

## 2017-11-09 NOTE — Progress Notes (Signed)
Inpatient Diabetes Program Recommendations  AACE/ADA: New Consensus Statement on Inpatient Glycemic Control (2019)  Target Ranges:  Prepandial:   less than 140 mg/dL      Peak postprandial:   less than 180 mg/dL (1-2 hours)      Critically ill patients:  140 - 180 mg/dL   Results for SEVE, MONETTE (MRN [id number]) as of 11/09/2017 15:11  Ref. Range 11/08/2017 17:28 11/08/2017 20:58 11/09/2017 07:28 11/09/2017 11:39  Glucose-Capillary Latest Ref Range: 70 - 99 mg/dL 303 (H) 193 (H) 193 (H) 219 (H)   Review of Glycemic Control  Diabetes history: DM2 Outpatient Diabetes medications: Novolog TID per correction scale, Metformin (was taking but patient reports kidney doctor told him not to take Metformin any longer yesterday) Current orders for Inpatient glycemic control: Lantus 10 units QHS, Novolog 3 units TID with meals, Novolog 0-9 units TID with meals, Novolog 0-5 units QHS  Inpatient Diabetes Program Recommendations:  Insulin - Basal: Please consider increasing Lantus to 13 units QHS. Insulin - Meal Coverage: Noted Novolog 3 units TID with meals added for meal coverage due to post prandial glucose consistently elevated. HgbA1C: A1C in process. Outpatient DM medications: At time of discharge, please consider prescribing basal insulin (patient reports that he has [place] insulin at home) along with Novolog insulin.    NOTE: Spoke with patient about diabetes and home regimen for diabetes control. Patient reports that he is followed by PCP and Endocrinologist for diabetes management and currently he is only using Novolog insulin per sliding scale for DM control. Inquired about basal insulin or an insulin pump (since both were noted to be used by the patient in the past). Patient states that he misplaced his insulin pump and he has only be using Novolog insulin for DM control. Patient reports that he last used an insulin pump in April but he has not been able to find it since then. Patient  notes that he has [place] insulin at home in the refrigerator that he has used in the past.   Patient states that he checks his glucose 3 times per day and that it has been in the 300's mg/dl mostly on his glucometer.  Inquired about prior A1C and patient reports that his last A1C was 10.6% several months ago. Last A1C in the chart is 15% on 07/18/2017.   Discussed glucose and A1C goals. Informed A1C has been ordered but results are not in the chart at this time. Encouraged patient to ask about A1C results if he is not informed about the results by tomorrow.  Discussed importance of checking CBGs and maintaining good CBG control to prevent long-term and short-term complications. Explained how hyperglycemia leads to damage within blood vessels and damage to nerve endings which lead to the common complications seen with uncontrolled diabetes.  Stressed to the patient the importance of improving glycemic control to prevent further complications from uncontrolled diabetes. Discussed impact of nutrition, exercise, stress, sickness, and medications on diabetes control. Discussed Tyler Aas and Novolog insulin and explained that the Novolog only works for 4-6 hours after administered so even if he is taking it 3 times a day it is not working for 24 hours like a basal insulin would be. Patient states that he would be willing to take basal insulin again. Explained that the [place] insulin he has at home needs to be checked to make sure it is still in date and that the dose will likely need to be adjusted. Informed patient that it would be requested  that MD would provide him with discharge instructions on how much basal insulin he should take at home. Encouraged patient to follow up with Endocrinologist (last office visited noted in the chart was 05/30/17).  Encouraged patient to check his glucose 4 times per day (before meals and at bedtime), to keep a log book of glucose readings and insulin taken which he will need to take to  doctor appointments, take insulin consistently as prescribed, and to follow up regularly with Endocrinologist. Patient verbalized understanding of information discussed and he states that he has no further questions at this time related to diabetes.  Thanks, [name] Alderman, RN, MSN, CDE Diabetes Coordinator Inpatient Diabetes Program [phone] (Team Pager)

## 2017-11-10 LAB — HEMOGLOBIN A1C
HEMOGLOBIN A1C: 15.2 % — AB (ref 4.8–5.6)
MEAN PLASMA GLUCOSE: 390 mg/dL

## 2017-11-10 LAB — BASIC METABOLIC PANEL
ANION GAP: 8 (ref 5–15)
BUN: 31 mg/dL — ABNORMAL HIGH (ref 6–20)
CALCIUM: 8.6 mg/dL — AB (ref 8.9–10.3)
CO2: 31 mmol/L (ref 22–32)
Chloride: 102 mmol/L (ref 98–111)
Creatinine, Ser: 2.03 mg/dL — ABNORMAL HIGH (ref 0.61–1.24)
GFR calc Af Amer: 43 mL/min — ABNORMAL LOW (ref >60)
GFR, EST NON AFRICAN AMERICAN: 37 mL/min — AB (ref >60)
GLUCOSE: 222 mg/dL — AB (ref 70–99)
Potassium: 3.4 mmol/L — ABNORMAL LOW (ref 3.5–5.1)
SODIUM: 141 mmol/L (ref 135–145)

## 2017-11-10 LAB — GLUCOSE, CAPILLARY
GLUCOSE-CAPILLARY: 235 mg/dL — AB (ref 70–99)
GLUCOSE-CAPILLARY: 220 mg/dL — AB (ref 70–99)

## 2017-11-10 MED ORDER — POTASSIUM CHLORIDE CRYS ER 20 MEQ PO TBCR: 40.0000 meq | EXTENDED_RELEASE_TABLET | Freq: Every day | ORAL | 1 refills | Status: DC

## 2017-11-10 MED ORDER — FUROSEMIDE 40 MG PO TABS
80.0000 mg | ORAL_TABLET | Freq: Two times a day (BID) | ORAL | Status: DC
  Filled 2017-11-10: qty 2

## 2017-11-10 MED ORDER — FUROSEMIDE 80 MG PO TABS: 80.0000 mg | ORAL_TABLET | Freq: Two times a day (BID) | ORAL | 2 refills | Status: DC

## 2017-11-10 MED ORDER — POTASSIUM CHLORIDE CRYS ER 20 MEQ PO TBCR
40.0000 meq | EXTENDED_RELEASE_TABLET | Freq: Once | ORAL | Status: AC
  Administered 2017-11-10: 40 meq via ORAL
  Filled 2017-11-10: qty 2

## 2017-11-10 MED ORDER — INSULIN GLARGINE 100 UNIT/ML ~~LOC~~ SOLN: 15.0000 [IU] | Freq: Every day | SUBCUTANEOUS | 11 refills | Status: DC

## 2017-11-10 MED ORDER — INSULIN GLARGINE 100 UNIT/ML ~~LOC~~ SOLN
15.0000 [IU] | Freq: Every day | SUBCUTANEOUS | Status: DC
  Filled 2017-11-10: qty 0.15

## 2017-11-10 MED ORDER — INSULIN GLARGINE 100 UNIT/ML ~~LOC~~ SOLN
5.0000 [IU] | Freq: Once | SUBCUTANEOUS | Status: AC
  Administered 2017-11-10: 5 [IU] via SUBCUTANEOUS
  Filled 2017-11-10: qty 0.05

## 2017-11-10 NOTE — Progress Notes (Signed)
Inpatient Diabetes Program Recommendations  AACE/ADA: New Consensus Statement on Inpatient Glycemic Control (2019)  Target Ranges:  Prepandial:   less than 140 mg/dL      Peak postprandial:   less than 180 mg/dL (1-2 hours)      Critically ill patients:  140 - 180 mg/dL   Results for [name], [name] (MRN [id number]) as of 11/10/2017 07:37  Ref. Range 11/10/2017 03:53  Glucose Latest Ref Range: 70 - 99 mg/dL 222 (H)   Results for [name], [name] (MRN [id number]) as of 11/10/2017 07:37  Ref. Range 11/09/2017 07:28 11/09/2017 11:39 11/09/2017 16:15 11/09/2017 21:03  Glucose-Capillary Latest Ref Range: 70 - 99 mg/dL 193 (H) 219 (H) 321 (H) 211 (H)    Results for [name], [name] (MRN [id number]) as of 11/10/2017 07:37  Ref. Range 07/18/2017 21:31 11/09/2017 03:02  Hemoglobin A1C Latest Ref Range: 4.8 - 5.6 % 15.0 (H) 15.2 (H)   Review of Glycemic Control  Diabetes history: DM2 Outpatient Diabetes medications: Novolog TID per correction scale, Metformin (was taking but patient reports kidney doctor told him not to take Metformin any longer on 11/08/17) Current orders for Inpatient glycemic control: Lantus 10 units QHS, Novolog 3 units TID with meals, Novolog 0-9 units TID with meals, Novolog 0-5 units QHS  Inpatient Diabetes Program Recommendations:  Insulin - Basal: Please consider increasing Lantus to 13 units QHS. Insulin - Meal Coverage: Please consider increasing meal coverage to Novolog 5 units TID with meals for meal coverage. HgbA1C: A1C15.2% on 11/09/17 indicating an average glucose of 390 mg/dl over the past 2-3 months. A1C noted to be 15% on 07/18/17 as well indicating very poor glycemic control. Outpatient DM medications: At time of discharge, please consider prescribing basal insulin (patient reports that he has [place] insulin at home) along with Novolog insulin meal coverage and correction. MD, may want to discharge on same insulin regimen being used as an inpatient if DM  fairly controlled.  Thanks, Barnie Alderman, RN, MSN, CDE Diabetes Coordinator Inpatient Diabetes Program [phone] (Team Pager from 8am to 5pm)

## 2017-11-10 NOTE — Discharge Summary (Signed)
[name] at [place] NAME: [name]    MR#:  [id number]  DATE OF BIRTH:  March 30, 1970  DATE OF ADMISSION:  11/08/2017 ADMITTING PHYSICIAN: Henreitta Leber, MD  DATE OF DISCHARGE: 11/10/2017  PRIMARY CARE PHYSICIAN: [name], MD    ADMISSION DIAGNOSIS:  Acute renal failure Acute CHF Exacerbation Acute Nephrotic Syndrome  DISCHARGE DIAGNOSIS:  acute diastolic congestive heart failure bilateral lower extremity leg edema improving with diuresis chronic kidney disease stage III uncontrolled diabetes with diabetic nephropathy  SECONDARY DIAGNOSIS:   Past Medical History:  Diagnosis Date  . Anxiety   . Cellulitis   . Chronic diarrhea   . Chronic kidney disease   . Chronic pain of both ankles   . Diabetes mellitus without complication ([place])   . Diabetes mellitus, type II ([place])   . Diabetic neuropathy ([place])   . Frequent falls   . Gait instability   . Heart palpitations   . HLD (hyperlipidemia)   . Hypertension   . Insomnia   . Nausea and vomiting in adult    recurrent    HOSPITAL COURSE:  47 year old male with past medical history of diabetes, chronic kidney disease stage III, hypertension, hyperlipidemia, abdominal pain/nausea vomiting who presents to the hospital due to 20 pound weight gain, shortness of breath.  1. Volume overload/lower extremity edema- suspected to be secondary to worsening acute kidney injury/acute diastolic heart failure. Patient has apparently had a 20 pound weight gain over the past 2 weeks. He has not responded to oral diuretics which have been doubled. -start him on high-dose IV Lasix--- change to Lasix 80 mg BID along with potassium 40 MeQ daily. - Follow I's and O's and daily weights. UOP 7000 ml -Follow response to high-dose diuretics. -Appreciate nephrology consultation with Dr. Juleen China-- patient will follow Dr. Juleen China next week  2. Acute on chronic renal failure-patient's  baseline creatinines is on close to 2 currently elevated at 2.6.  -Patient has underlying nephrotic range proteinuria and CKD secondary to diabetic nephropathy. Patient now has volume overload as mentioned above with the 20 pound weight gain and lower extremity edema.  - on high-dose IV diuretics and follow response.--- Decreased weight. Improving at present  3. Abdominal pain/nausea/vomiting-suspected to be secondary to underlying suspected diabetic gastroparesis. Patient is currently being worked up as an outpatient by gastroenterology. I will continue his scheduled Reglan, continue Bentyl. -Patient advised small frequent meals instead of big breakfast and skipping lunch. This will help his sugar control and overall his symptoms of nausea and vomiting.  4. Essential hypertension-continue carvedilol, Cardizem, lisinopril  5. 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 Follow blood sugars. -start lantus 15 units daily -A1c is 15.2 -patient will follow up with endocrinology at [facility] he has appointment at on September 30  6. BPH-continue Flomax.  7. Hyperlipidemia-continue Crestor.  8. GERD-continue Protonix.  D/c to home with outpatient follow-up nephrology and endocrinology  CONSULTS OBTAINED:  Treatment Team:  Lavonia Dana, MD  DRUG ALLERGIES:   Allergies  Allergen Reactions  . Levofloxacin Swelling  . Phenergan [Promethazine Hcl] Other (See Comments)    Cramping   . Malt     Other reaction(s): OTHER  . Soy Allergy Other (See Comments)    Other reaction(s): OTHER    DISCHARGE MEDICATIONS:   Allergies as of 11/10/2017      Reactions   Levofloxacin Swelling   Phenergan [promethazine Hcl] Other (  See Comments)   Cramping   Malt    Other reaction(s): OTHER   Soy Allergy Other (See Comments)   Other reaction(s): OTHER      Medication List    STOP taking these medications    HYDROcodone-acetaminophen 5-325 MG tablet Commonly known as:  NORCO/VICODIN   metFORMIN 500 MG tablet Commonly known as:  GLUCOPHAGE     TAKE these medications   buPROPion 75 MG tablet Commonly known as:  WELLBUTRIN Take 1 tablet (75 mg total) by mouth every morning.   calcitRIOL 0.25 MCG capsule Commonly known as:  ROCALTROL Take 1 capsule by mouth daily.   calcium citrate-vitamin D 315-200 MG-UNIT tablet Commonly known as:  CITRACAL+D Take 2 tablets by mouth daily.   carvedilol 12.5 MG tablet Commonly known as:  COREG Take 12.5 mg by mouth 2 (two) times daily with a meal.   cholestyramine light 4 g packet Commonly known as:  PREVALITE Take 4 g by mouth daily as needed (digestion).   citalopram 20 MG tablet Commonly known as:  CELEXA Take 20 mg by mouth daily.   dicyclomine 20 MG tablet Commonly known as:  BENTYL Take 1 tablet (20 mg total) by mouth 3 (three) times daily as needed (abdominal pain).   diltiazem 120 MG tablet Commonly known as:  CARDIZEM Take 120 mg by mouth daily.   FEROSUL 325 (65 FE) MG tablet Generic drug:  ferrous sulfate Take 325 mg by mouth 2 (two) times daily with a meal.   fluticasone 50 MCG/ACT nasal spray Commonly known as:  FLONASE Place 1 spray into both nostrils daily as needed for allergies.   furosemide 80 MG tablet Commonly known as:  LASIX Take 1 tablet (80 mg total) by mouth 2 (two) times daily. What changed:    medication strength  how much to take  when to take this   insulin aspart 100 UNIT/ML injection Commonly known as:  novoLOG Inject 0-6 Units into the skin 3 (three) times daily before meals. Per sliding scale   insulin glargine 100 UNIT/ML injection Commonly known as:  LANTUS Inject 0.15 mLs (15 Units total) into the skin at bedtime.   lisinopril 20 MG tablet Commonly known as:  PRINIVIL,ZESTRIL Take 20 mg by mouth daily.   loratadine 10 MG tablet Commonly known as:  CLARITIN Take 10 mg by mouth  daily.   metoCLOPramide 10 MG tablet Commonly known as:  REGLAN Take 1 tablet (10 mg total) by mouth every 8 (eight) hours as needed for nausea.   ondansetron 4 MG disintegrating tablet Commonly known as:  ZOFRAN-ODT Take 4 mg by mouth every 8 (eight) hours as needed for nausea or vomiting.   pantoprazole 40 MG tablet Commonly known as:  PROTONIX Take 40 mg by mouth 2 (two) times daily.   potassium chloride SA 20 MEQ tablet Commonly known as:  K-DUR,KLOR-CON Take 2 tablets (40 mEq total) by mouth daily.   rosuvastatin 40 MG tablet Commonly known as:  CRESTOR Take 40 mg by mouth daily.   tamsulosin 0.4 MG Caps capsule Commonly known as:  FLOMAX Take 0.4 mg by mouth daily after breakfast.   traZODone 50 MG tablet Commonly known as:  DESYREL Take 0.5-1 tablets (25-50 mg total) by mouth at bedtime as needed for sleep.       If you experience worsening of your admission symptoms, develop shortness of breath, life threatening emergency, suicidal or homicidal thoughts you must seek medical attention immediately by calling 911 or calling your  MD immediately  if symptoms less severe.  You Must read complete instructions/literature along with all the possible adverse reactions/side effects for all the Medicines you take and that have been prescribed to you. Take any new Medicines after you have completely understood and accept all the possible adverse reactions/side effects.   Please note  You were cared for by a hospitalist during your hospital stay. If you have any questions about your discharge medications or the care you received while you were in the hospital after you are discharged, you can call the unit and asked to speak with the hospitalist on call if the hospitalist that took care of you is not available. Once you are discharged, your primary care physician will handle any further medical issues. Please note that NO REFILLS for any discharge medications will be authorized once  you are discharged, as it is imperative that you return to your primary care physician (or establish a relationship with a primary care physician if you do not have one) for your aftercare needs so that they can reassess your need for medications and monitor your lab values. Today   SUBJECTIVE  No new complaints/ good UOP   VITAL SIGNS:  Blood pressure (!) 151/77, pulse 81, temperature 98.4 F (36.9 C), temperature source Oral, resp. rate 16, height 6\' 3"  (1.905 m), weight 105.8 kg, SpO2 99 %.  I/O:    Intake/Output Summary (Last 24 hours) at 11/10/2017 0950 Last data filed at 11/10/2017 0834 Gross per 24 hour  Intake -  Output 5250 ml  Net -5250 ml    PHYSICAL EXAMINATION:  GENERAL:  47 y.o.-year-old patient lying in the bed with no acute distress.  EYES: Pupils equal, round, reactive to light and accommodation. No scleral icterus. Extraocular muscles intact.  HEENT: Head atraumatic, normocephalic. Oropharynx and nasopharynx clear.  NECK:  Supple, no jugular venous distention. No thyroid enlargement, no tenderness.  LUNGS: Normal breath sounds bilaterally, no wheezing, rales,rhonchi or crepitation. No use of accessory muscles of respiration.  CARDIOVASCULAR: S1, S2 normal. No murmurs, rubs, or gallops.  ABDOMEN: Soft, non-tender, non-distended. Bowel sounds present. No organomegaly or mass.  EXTREMITIES: ++pedal edema,no cyanosis, or clubbing.  NEUROLOGIC: Cranial nerves II through XII are intact. Muscle strength 5/5 in all extremities. Sensation intact. Gait not checked.  PSYCHIATRIC: The patient is alert and oriented x 3.  SKIN: No obvious rash, lesion, or ulcer.   DATA REVIEW:   CBC  Recent Labs  Lab 11/09/17 0302  WBC 7.5  HGB 9.3*  HCT 28.3*  PLT 250    Chemistries  Recent Labs  Lab 11/08/17 1638  11/10/17 0353  NA 135   < > 141  K 3.5   < > 3.4*  CL 98   < > 102  CO2 31   < > 31  GLUCOSE 394*   < > 222*  BUN 30*   < > 31*  CREATININE 2.53*   < > 2.03*   CALCIUM 8.0*   < > 8.6*  AST 19  --   --   ALT 19  --   --   ALKPHOS 74  --   --   BILITOT 0.7  --   --    < > = values in this interval not displayed.    Microbiology Results   No results found for this or any previous visit (from the past 240 hour(s)).  RADIOLOGY:  Dg Chest Port 1 View  Result Date: 11/08/2017 CLINICAL DATA:  Shortness  of breath and nausea EXAM: PORTABLE CHEST 1 VIEW COMPARISON:  Chest radiograph 10/14/2017 FINDINGS: The heart size and mediastinal contours are within normal limits. Both lungs are clear. The visualized skeletal structures are unremarkable. IMPRESSION: No active disease. Electronically Signed   By: Ulyses Jarred M.D.   On: 11/08/2017 19:30     Management plans discussed with the patient, family and they are in agreement.  CODE STATUS:     Code Status Orders  (From admission, onward)         Start     Ordered   11/08/17 1607  Full code  Continuous     11/08/17 1609        Code Status History    Date Active Date Inactive Code Status Order ID Comments User Context   07/18/2017 2120 07/19/2017 1825 Full Code [id number]  Gorden Harms, MD Inpatient   08/18/2016 2314 08/19/2016 1901 Full Code [id number]  Vaughan Basta, MD Inpatient   07/09/2016 0141 07/09/2016 1759 Full Code [id number]  Vaughan Basta, MD ED   03/15/2016 2207 03/17/2016 1753 Full Code [id number]  Etta Quill, DO ED   06/23/2015 2139 06/28/2015 1733 Full Code [id number]  Ivor Costa, MD ED   03/07/2015 0133 03/10/2015 2107 Full Code [id number]  Harrie Foreman, MD Inpatient      TOTAL TIME TAKING CARE OF THIS PATIENT: 40 minutes.    Fritzi Mandes M.D on 11/10/2017 at 9:50 AM  Between 7am to 6pm - Pager - [phone] After 6pm go to [email or web address] - password [email or web address] [place] [facility]  Office  [phone]  CC: Primary care physician; [name], MD

## 2017-11-10 NOTE — Discharge Instructions (Signed)
Keep a log of his sugars as before

## 2017-11-10 NOTE — Care Management Note (Signed)
Case Management Note  Patient Details  Name: Wylan Gentzler. MRN: [id number] Date of Birth: [birth year]  Subjective/Objective:      Discharging today.  Admitted with SOB.  New heart Failure.   Independent in all adls, denies issues accessing medical care, obtaining medications or with transportation.  Current with PCP.  No discharge needs identified at present by care manager or members of care team               Action/Plan:     Made appointment with HF clinic.    Expected Discharge Date:  11/10/17               Expected Discharge Plan:  Home/Self Care  In-House Referral:     Discharge planning Services  CM Consult, HF Clinic  Post Acute Care Choice:    Choice offered to:     DME Arranged:    DME Agency:     HH Arranged:    HH Agency:     Status of Service:  In process, will continue to follow  If discussed at Long Length of Stay Meetings, dates discussed:    Additional Comments:  Elza Rafter, RN 11/10/2017, 10:19 AM

## 2017-11-10 NOTE — Progress Notes (Signed)
Patient is being discharged home, all discharge instruction provided, iv removed tele removed =, patient discharge home

## 2017-11-10 NOTE — Progress Notes (Signed)
[facility]  ROUNDING NOTE   Subjective:   UOP 5.3 liters.   Creatinine 2.03 (2.24)  Objective:  Vital signs in last 24 hours:  Temp:  [98 F (36.7 C)-98.7 F (37.1 C)] 98.4 F (36.9 C) (09/19 0822) Pulse Rate:  [74-81] 81 (09/19 0822) Resp:  [16-18] 16 (09/19 0424) BP: (151-189)/(77-103) 151/77 (09/19 0822) SpO2:  [99 %-100 %] 99 % (09/19 0424) Weight:  [105.8 kg] 105.8 kg (09/19 0425)  Weight change: -0.998 kg Filed Weights   11/08/17 1738 11/09/17 0521 11/10/17 0425  Weight: 106.8 kg 104.2 kg 105.8 kg    Intake/Output: I/O last 3 completed shifts: In: -  Out: 6375 [Urine:6375]   Intake/Output this shift:  Total I/O In: -  Out: 700 [Urine:700]  Physical Exam: General: NAD, sitting in chair  Head: Normocephalic, atraumatic. Moist oral mucosal membranes  Eyes: Anicteric, PERRL  Neck: Supple, trachea midline  Lungs:  clear  Heart: Regular rate and rhythm  Abdomen:  Soft, nontender,   Extremities:  trace peripheral edema.  Neurologic: Nonfocal, moving all four extremities  Skin: No lesions        Basic Metabolic Panel: Recent Labs  Lab 11/07/17 1555 11/08/17 1638 11/09/17 0302 11/10/17 0353  NA 138 135 137 141  K 3.4* 3.5 2.9* 3.4*  CL 100 98 100 102  CO2 32 31 30 31   GLUCOSE 218* 394* 210* 222*  BUN 36* 30* 35* 31*  CREATININE 2.67* 2.53* 2.24* 2.03*  CALCIUM 8.0* 8.0* 7.9* 8.6*    Liver Function Tests: Recent Labs  Lab 11/07/17 1555 11/08/17 1638  AST 24 19  ALT 22 19  ALKPHOS 83 74  BILITOT 0.7 0.7  PROT 5.5* 5.1*  ALBUMIN 2.3* 2.1*   Recent Labs  Lab 11/07/17 1555  LIPASE 23   No results for input(s): AMMONIA in the last 168 hours.  CBC: Recent Labs  Lab 11/07/17 1555 11/08/17 1638 11/09/17 0302  WBC 7.7 7.4 7.5  HGB 10.0* 9.8* 9.3*  HCT 30.3* 30.0* 28.3*  MCV 70.8* 71.7* 71.4*  PLT 275 247 250    Cardiac Enzymes: No results for input(s): CKTOTAL, CKMB, CKMBINDEX, TROPONINI in the last 168  hours.  BNP: Invalid input(s): POCBNP  CBG: Recent Labs  Lab 11/09/17 1139 11/09/17 1615 11/09/17 2103 11/10/17 0738 11/10/17 1143  GLUCAP 219* 321* 211* 235* 220*    Microbiology: Results for orders placed or performed during the hospital encounter of 01/24/17  Aerobic Culture (superficial specimen)     Status: None   Collection Time: 01/24/17 11:24 AM  Result Value Ref Range Status   Specimen Description WOUND RIGHT LOWER LEG  Final   Special Requests RT LOWER ANTERIOR LEG  Final   Gram Stain   Final    MODERATE WBC PRESENT, PREDOMINANTLY PMN NO ORGANISMS SEEN Performed at [facility], [place] [address], [place], [place] [address]    Culture MODERATE STAPHYLOCOCCUS AUREUS  Final   Report Status 01/27/2017 FINAL  Final   Organism ID, Bacteria STAPHYLOCOCCUS AUREUS  Final      Susceptibility   Staphylococcus aureus - MIC*    CIPROFLOXACIN <=0.5 SENSITIVE Sensitive     ERYTHROMYCIN >=8 RESISTANT Resistant     GENTAMICIN <=0.5 SENSITIVE Sensitive     OXACILLIN 0.5 SENSITIVE Sensitive     TETRACYCLINE <=1 SENSITIVE Sensitive     VANCOMYCIN <=0.5 SENSITIVE Sensitive     TRIMETH/SULFA <=10 SENSITIVE Sensitive     CLINDAMYCIN >=8 RESISTANT Resistant  RIFAMPIN <=0.5 SENSITIVE Sensitive     Inducible Clindamycin NEGATIVE Sensitive     * MODERATE STAPHYLOCOCCUS AUREUS    Coagulation Studies: No results for input(s): LABPROT, INR in the last 72 hours.  Urinalysis: Recent Labs    11/07/17 1855  COLORURINE YELLOW*  LABSPEC 1.016  PHURINE 5.0  GLUCOSEU >=500*  HGBUR MODERATE*  BILIRUBINUR NEGATIVE  KETONESUR NEGATIVE  PROTEINUR >=300*  NITRITE NEGATIVE  LEUKOCYTESUR NEGATIVE      Imaging: Dg Chest Port 1 View  Result Date: 11/08/2017 CLINICAL DATA:  Shortness of breath and nausea EXAM: PORTABLE CHEST 1 VIEW COMPARISON:  Chest radiograph 10/14/2017 FINDINGS: The heart size and mediastinal contours are within normal limits. Both lungs are clear. The  visualized skeletal structures are unremarkable. IMPRESSION: No active disease. Electronically Signed   By: Ulyses Jarred M.D.   On: 11/08/2017 19:30     Medications:    . buPROPion  75 mg Oral q morning - 10a  . calcitRIOL  0.25 mcg Oral Daily  . calcium citrate-vitamin D  2 tablet Oral Daily  . carvedilol  12.5 mg Oral BID WC  . citalopram  20 mg Oral Daily  . diltiazem  120 mg Oral Daily  . ferrous sulfate  325 mg Oral BID WC  . furosemide  80 mg Oral BID  . heparin  5,000 Units Subcutaneous Q8H  . insulin aspart  0-5 Units Subcutaneous QHS  . insulin aspart  0-9 Units Subcutaneous TID WC  . insulin aspart  3 Units Subcutaneous TID WC  . insulin glargine  15 Units Subcutaneous QHS  . lisinopril  20 mg Oral Daily  . loratadine  10 mg Oral Daily  . metoCLOPramide (REGLAN) injection  10 mg Intravenous Q6H  . pantoprazole  40 mg Oral BID  . protein supplement shake  11 oz Oral BID BM  . rosuvastatin  40 mg Oral Daily  . tamsulosin  0.4 mg Oral QPC breakfast   acetaminophen **OR** acetaminophen, cholestyramine light, dicyclomine, HYDROcodone-acetaminophen, labetalol, loperamide, ondansetron **OR** ondansetron (ZOFRAN) IV  Assessment/ Plan:  [name]. is a 47 y.o. black male with hypertension, diabetes mellitus type II, diabetic retinopathy, hyperlipidemia, BPH, who presents with acute exacerbation of congestive heart failure.   1. Acute renal failure on Chronic Kidney Disease stage III with nephrotic range proteinuria. Baseline creatinine 2.23, GFR of 29 from 08/30/17 Acute renal failure secondary to acute cardiorenal syndrome.  Chronic kidney disease secondary to diabetic nephropathy.  Creatinine back to baseline.   2. Hypertension with acute exacerbation of diastolic congestive heart failure. History of difficult to control. 151/77 - Continue carvedilol, lisinopril, tamsulosin and amlodipine - PO furosemide 80mg  bid.   3. Diabetes mellitus type II with chronic  kidney disease: history of poor control. Hemoglobin A1c 15.2%.   4. Anemia: hemoglobin 9.3  - Discussed ESA with patient  5. Secondary Hyperparathyroidism:  - Calcitriol.   6. Hypokalemia: secondary diuretics.  - PO potassium chloride.   Outpatient nephrology appointment on 9/24 at 10:20am.   LOS: 2 [name] 9/19/201912:13 PM

## 2017-11-22 ENCOUNTER — Other Ambulatory Visit: Payer: Self-pay | Admitting: Gastroenterology

## 2017-11-22 DIAGNOSIS — R112 Nausea with vomiting, unspecified: Secondary | ICD-10-CM

## 2017-11-22 NOTE — Progress Notes (Signed)
Patient ID: [name] Cooper., male    DOB: Mar 09, 1970, 47 y.o.   MRN: [id number]  HPI  [name] is a 47 y/o male with a history of DM, hyperlipidemia, HTN, CKD, anxiety and chronic heart failure.   Echo report from 06/29/17 reviewed and showed an EF of 75% along with mild [name].   Admitted 11/08/17 due to acute HF exacerbation with a 20 pound weight gain over previous 2 weeks. Initially needed IV lasix and then transitioned to oral diuretics. Nephrology consult obtained. Discharged after 2 days. Was in the ED 11/07/17 due to abdominal pain where he was treated and released.   He presents today for his initial visit with a chief complaint of moderate fatigue upon minimal exertion. He describes this as chronic in nature having been present for several months. He has associated pedal edema, weight gain, abdominal pain, diarrhea, dizziness and difficulty sleeping along with this. He denies any abdominal distention, palpitations, chest pain, shortness of breath or cough. Says that he has diarrhea 3-4 times/ day and has a lot of vomiting after he eats. He's unsure how much of his medication he is actually keeping down.   Past Medical History:  Diagnosis Date  . Anxiety   . Cellulitis   . CHF (congestive heart failure) ([place])   . Chronic diarrhea   . Chronic kidney disease   . Chronic pain of both ankles   . Diabetes mellitus without complication ([place])   . Diabetes mellitus, type II ([place])   . Diabetic neuropathy ([place])   . Frequent falls   . Gait instability   . Heart palpitations   . HLD (hyperlipidemia)   . Hypertension   . Insomnia   . Nausea and vomiting in adult    recurrent   Past Surgical History:  Procedure Laterality Date  . AMPUTATION Left 03/16/2016   Procedure: AMPUTATION DIGIT LEFT HALLUX;  Surgeon: Trula Slade, DPM;  Location: [facility];  Service: Podiatry;  Laterality: Left;  can start around 5   . ARTHROSCOPIC REPAIR ACL Left   . COLONOSCOPY WITH PROPOFOL N/A 10/28/2015   Procedure: COLONOSCOPY WITH PROPOFOL;  Surgeon: Lollie Sails, MD;  Location: [facility] ENDOSCOPY;  Service: Endoscopy;  Laterality: N/A;  . COLONOSCOPY WITH PROPOFOL N/A 10/29/2015   Procedure: COLONOSCOPY WITH PROPOFOL;  Surgeon: Lollie Sails, MD;  Location: [facility] ENDOSCOPY;  Service: Endoscopy;  Laterality: N/A;  . PROSTATE SURGERY  2016  . TONSILECTOMY/ADENOIDECTOMY WITH MYRINGOTOMY    . TONSILLECTOMY     Family History  Problem Relation Age of Onset  . CAD Father   . Stroke Father   . Diabetes Mellitus II Mother   . Kidney failure Mother   . Schizophrenia Mother    Social History   Tobacco Use  . Smoking status: Never Smoker  . Smokeless tobacco: Never Used  Substance Use Topics  . Alcohol use: No   Allergies  Allergen Reactions  . Levofloxacin Swelling  . Phenergan [Promethazine Hcl] Other (See Comments)    Cramping   . Malt     Other reaction(s): OTHER  . Soy Allergy Other (See Comments)    Other reaction(s): OTHER   Prior to Admission medications   Medication Sig Start Date End Date Taking? Authorizing [name]  buPROPion (WELLBUTRIN) 75 MG tablet Take 1 tablet (75 mg total) by mouth every morning. 07/05/17  Yes [name] Alert, MD  calcitRIOL (ROCALTROL) 0.25 MCG capsule Take 1 capsule by mouth daily. 09/20/17  Yes [name], Historical, MD  calcium citrate-vitamin D (CITRACAL+D) 315-200 MG-UNIT tablet Take 2 tablets by mouth daily.    Yes [name], Historical, MD  carvedilol (COREG) 12.5 MG tablet Take 12.5 mg by mouth 2 (two) times daily with a meal.  09/20/17  Yes [name], Historical, MD  cholestyramine light (PREVALITE) 4 g packet Take 4 g by mouth daily as needed (digestion).  11/17/15  Yes [name], Historical, MD  citalopram (CELEXA) 20 MG tablet Take 20 mg by mouth daily.  06/16/17  Yes [name], Historical, MD  diltiazem (CARDIZEM) 120 MG tablet Take 120 mg by mouth daily.    Yes [name], Historical, MD  FEROSUL 325 (65 Fe) MG tablet Take 325 mg by mouth 2  (two) times daily with a meal.  09/07/17  Yes [name], Historical, MD  fluticasone (FLONASE) 50 MCG/ACT nasal spray Place 1 spray into both nostrils daily as needed for allergies.  10/06/16  Yes [name], Historical, MD  furosemide (LASIX) 80 MG tablet Take 1 tablet (80 mg total) by mouth 2 (two) times daily. 11/10/17  Yes [name] Mandes, MD  insulin aspart (NOVOLOG) 100 UNIT/ML injection Inject 0-6 Units into the skin 3 (three) times daily before meals. Per sliding scale   Yes [name], Historical, MD  insulin glargine (LANTUS) 100 UNIT/ML injection Inject 0.15 mLs (15 Units total) into the skin at bedtime. 11/10/17  Yes [name] Mandes, MD  lisinopril (PRINIVIL,ZESTRIL) 20 MG tablet Take 20 mg by mouth daily.  10/04/17  Yes [name], Historical, MD  loratadine (CLARITIN) 10 MG tablet Take 10 mg by mouth daily.   Yes [name], Historical, MD  metoCLOPramide (REGLAN) 10 MG tablet Take 1 tablet (10 mg total) by mouth every 8 (eight) hours as needed for nausea. 11/07/17 11/07/18 Yes [name] Pear, MD  ondansetron (ZOFRAN-ODT) 4 MG disintegrating tablet Take 4 mg by mouth every 8 (eight) hours as needed for nausea or vomiting.  10/13/17  Yes [name], Historical, MD  pantoprazole (PROTONIX) 40 MG tablet Take 40 mg by mouth 2 (two) times daily.   Yes [name], Historical, MD  potassium chloride SA (K-DUR,KLOR-CON) 20 MEQ tablet Take 2 tablets (40 mEq total) by mouth daily. 11/10/17  Yes [name] Mandes, MD  rosuvastatin (CRESTOR) 40 MG tablet Take 40 mg by mouth daily.  05/26/17  Yes [name], Historical, MD  tamsulosin (FLOMAX) 0.4 MG CAPS capsule Take 0.4 mg by mouth daily after breakfast.    Yes [name], Historical, MD  traZODone (DESYREL) 50 MG tablet Take 0.5-1 tablets (25-50 mg total) by mouth at bedtime as needed for sleep. 07/05/17  Yes [name] Alert, MD  dicyclomine (BENTYL) 20 MG tablet Take 1 tablet (20 mg total) by mouth 3 (three) times daily as needed (abdominal pain). Patient not taking: Reported on  11/23/2017 11/07/17   [name] Pear, MD    Review of Systems  Constitutional: Positive for appetite change (decreased) and fatigue (tire easily).  HENT: Negative for congestion, postnasal drip and sore throat.   Eyes: Negative.   Respiratory: Negative for chest tightness and shortness of breath.   Cardiovascular: Positive for leg swelling. Negative for chest pain.  Gastrointestinal: Positive for abdominal pain, diarrhea and vomiting (after eating). Negative for abdominal distention.  Endocrine: Negative.   Genitourinary: Negative.   Musculoskeletal: Negative for back pain and neck pain.  Skin: Negative.   Allergic/Immunologic: Negative.   Neurological: Positive for dizziness (at times). Negative for light-headedness.  Hematological: Negative for adenopathy. Does not bruise/bleed easily.  Psychiatric/Behavioral: Positive for sleep disturbance. Negative for dysphoric mood. The patient is not  nervous/anxious.     Vitals:   11/23/17 1205  BP: (!) 161/99  Pulse: 94  Resp: 18  SpO2: 100%  Weight: 235 lb 6 oz (106.8 kg)  Height: 6\' 3"  (1.905 m)   Wt Readings from Last 3 Encounters:  11/23/17 235 lb 6 oz (106.8 kg)  11/10/17 233 lb 3.2 oz (105.8 kg)  11/07/17 229 lb (103.9 kg)   Lab Results  Component Value Date   CREATININE 2.03 (H) 11/10/2017   CREATININE 2.24 (H) 11/09/2017   CREATININE 2.53 (H) 11/08/2017    Physical Exam  Constitutional: He is oriented to person, place, and time. He appears well-developed and well-nourished.  HENT:  Head: Normocephalic and atraumatic.  Neck: Normal range of motion. Neck supple. No JVD present.  Cardiovascular: Normal rate and regular rhythm.  Pulmonary/Chest: Effort normal. He has no wheezes. He has no rales.  Abdominal: Soft. He exhibits no distension. There is tenderness.  Musculoskeletal: He exhibits edema (3+ pitting bilateral lower legs). He exhibits no tenderness.  Neurological: He is Macdonald and oriented to person, place, and  time.  Skin: Skin is warm and dry.  Psychiatric: He has a normal mood and affect. His behavior is normal. Thought content normal.  Nursing note and vitals reviewed.  Assessment & Plan:  1: Acute on Chronic heart failure with preserved ejection fraction- - NYHA class III - moderately fluid overloaded today - weighing daily and he was instructed to call for an overnight weight gain of >2 pounds or a weekly weight gain of >5 pounds - not adding salt to his food and occasionally reads food labels. Reviewed the importance of closely following a 2000mg  sodium diet and written dietary information was given to him about this - will send patient for 80mg  IV lasix/ 30meq potassium - he will get BMP/ BNP today as well - BNP 07/18/17 was 12.0  2: HTN- - BP elevated today but patient is fluid overloaded - sees PCP [name]) tomorrow - BMP 11/10/17 reviewed and showed sodium 141, potassium 3.4, creatinine 2.03 and GFR 43  3: DM- - says that his glucose this morning was 301 - sees nephrology ([place]) - A1c 11/09/17 was 15.2%  4: Diarrhea- - says that he has this 3-4 times/ day along with vomiting after eating - he's concerned that his body isn't absorbing his medications due to the vomiting - saw GI Ellin Mayhew) 11/03/17  Patient did not bring his medications nor a list. Each medication was verbally reviewed with the patient and hhe was encouraged to bring the bottles to every visit to confirm accuracy of list.  Return in 5 days or sooner for any questions/problems before then.

## 2017-11-23 ENCOUNTER — Encounter: Payer: Self-pay | Admitting: Family

## 2017-11-23 ENCOUNTER — Ambulatory Visit
Admission: RE | Admit: 2017-11-23 | Discharge: 2017-11-23 | Disposition: A | Discharge status: Home / Self Care | Payer: BC Managed Care – PPO | Source: Ambulatory Visit | Attending: Family | Admitting: Family

## 2017-11-23 ENCOUNTER — Other Ambulatory Visit: Payer: Self-pay | Admitting: Family

## 2017-11-23 ENCOUNTER — Ambulatory Visit: Payer: BC Managed Care – PPO | Admitting: Family

## 2017-11-23 VITALS — BP 161/99 | HR 94 | Resp 18 | Ht 75.0 in | Wt 235.4 lb

## 2017-11-23 DIAGNOSIS — I5031 Acute diastolic (congestive) heart failure: Secondary | ICD-10-CM

## 2017-11-23 DIAGNOSIS — Z8249 Family history of ischemic heart disease and other diseases of the circulatory system: Secondary | ICD-10-CM | POA: Insufficient documentation

## 2017-11-23 DIAGNOSIS — Z823 Family history of stroke: Secondary | ICD-10-CM | POA: Diagnosis not present

## 2017-11-23 DIAGNOSIS — I130 Hypertensive heart and chronic kidney disease with heart failure and stage 1 through stage 4 chronic kidney disease, or unspecified chronic kidney disease: Secondary | ICD-10-CM | POA: Insufficient documentation

## 2017-11-23 DIAGNOSIS — I5033 Acute on chronic diastolic (congestive) heart failure: Secondary | ICD-10-CM

## 2017-11-23 DIAGNOSIS — Z841 Family history of disorders of kidney and ureter: Secondary | ICD-10-CM | POA: Diagnosis not present

## 2017-11-23 DIAGNOSIS — Z888 Allergy status to other drugs, medicaments and biological substances status: Secondary | ICD-10-CM | POA: Diagnosis not present

## 2017-11-23 DIAGNOSIS — E119 Type 2 diabetes mellitus without complications: Secondary | ICD-10-CM | POA: Insufficient documentation

## 2017-11-23 DIAGNOSIS — Z79899 Other long term (current) drug therapy: Secondary | ICD-10-CM | POA: Insufficient documentation

## 2017-11-23 DIAGNOSIS — R197 Diarrhea, unspecified: Secondary | ICD-10-CM | POA: Insufficient documentation

## 2017-11-23 DIAGNOSIS — I10 Essential (primary) hypertension: Secondary | ICD-10-CM

## 2017-11-23 DIAGNOSIS — N183 Chronic kidney disease, stage 3 unspecified: Secondary | ICD-10-CM

## 2017-11-23 DIAGNOSIS — E1140 Type 2 diabetes mellitus with diabetic neuropathy, unspecified: Secondary | ICD-10-CM | POA: Insufficient documentation

## 2017-11-23 DIAGNOSIS — Z833 Family history of diabetes mellitus: Secondary | ICD-10-CM | POA: Insufficient documentation

## 2017-11-23 DIAGNOSIS — I509 Heart failure, unspecified: Secondary | ICD-10-CM | POA: Diagnosis present

## 2017-11-23 DIAGNOSIS — E785 Hyperlipidemia, unspecified: Secondary | ICD-10-CM | POA: Diagnosis not present

## 2017-11-23 DIAGNOSIS — Z794 Long term (current) use of insulin: Secondary | ICD-10-CM | POA: Diagnosis not present

## 2017-11-23 DIAGNOSIS — F419 Anxiety disorder, unspecified: Secondary | ICD-10-CM | POA: Insufficient documentation

## 2017-11-23 DIAGNOSIS — Z881 Allergy status to other antibiotic agents status: Secondary | ICD-10-CM | POA: Insufficient documentation

## 2017-11-23 DIAGNOSIS — E1122 Type 2 diabetes mellitus with diabetic chronic kidney disease: Secondary | ICD-10-CM | POA: Diagnosis not present

## 2017-11-23 DIAGNOSIS — Z818 Family history of other mental and behavioral disorders: Secondary | ICD-10-CM | POA: Diagnosis not present

## 2017-11-23 DIAGNOSIS — N189 Chronic kidney disease, unspecified: Secondary | ICD-10-CM | POA: Diagnosis not present

## 2017-11-23 DIAGNOSIS — G4700 Insomnia, unspecified: Secondary | ICD-10-CM | POA: Diagnosis not present

## 2017-11-23 DIAGNOSIS — E1022 Type 1 diabetes mellitus with diabetic chronic kidney disease: Secondary | ICD-10-CM

## 2017-11-23 LAB — BRAIN NATRIURETIC PEPTIDE: B Natriuretic Peptide: 25 pg/mL (ref 0.0–100.0)

## 2017-11-23 LAB — BASIC METABOLIC PANEL
ANION GAP: 10 (ref 5–15)
BUN: 30 mg/dL — ABNORMAL HIGH (ref 6–20)
CALCIUM: 8.2 mg/dL — AB (ref 8.9–10.3)
CO2: 29 mmol/L (ref 22–32)
CREATININE: 2.34 mg/dL — AB (ref 0.61–1.24)
Chloride: 95 mmol/L — ABNORMAL LOW (ref 98–111)
GFR calc non Af Amer: 31 mL/min — ABNORMAL LOW (ref >60)
GFR, EST AFRICAN AMERICAN: 36 mL/min — AB (ref >60)
GLUCOSE: 475 mg/dL — AB (ref 70–99)
Potassium: 3.6 mmol/L (ref 3.5–5.1)
Sodium: 134 mmol/L — ABNORMAL LOW (ref 135–145)

## 2017-11-23 MED ORDER — POTASSIUM CHLORIDE CRYS ER 20 MEQ PO TBCR
40.0000 meq | EXTENDED_RELEASE_TABLET | Freq: Once | ORAL | Status: AC
  Administered 2017-11-23: 40 meq via ORAL

## 2017-11-23 MED ORDER — FUROSEMIDE 10 MG/ML IJ SOLN
INTRAMUSCULAR | Status: AC
  Filled 2017-11-23: qty 8

## 2017-11-23 MED ORDER — FUROSEMIDE 10 MG/ML IJ SOLN
80.0000 mg | Freq: Once | INTRAMUSCULAR | Status: AC
  Administered 2017-11-23: 80 mg via INTRAVENOUS

## 2017-11-23 MED ORDER — SODIUM CHLORIDE 0.9 % IJ SOLN
INTRAMUSCULAR | Status: AC
  Filled 2017-11-23: qty 10

## 2017-11-23 MED ORDER — POTASSIUM CHLORIDE CRYS ER 20 MEQ PO TBCR
EXTENDED_RELEASE_TABLET | ORAL | Status: AC
  Filled 2017-11-23: qty 2

## 2017-11-23 NOTE — Patient Instructions (Addendum)
Continue weighing daily and call for an overnight weight gain of > 2 pounds or a weekly weight gain of >5 pounds.  Drink between 60-64 ounces of fluid daily.  Low-Sodium Eating Plan Sodium, which is an element that makes up salt, helps you maintain a healthy balance of fluids in your body. Too much sodium can increase your blood pressure and cause fluid and waste to be held in your body. Your health care provider or dietitian may recommend following this plan if you have high blood pressure (hypertension), kidney disease, liver disease, or heart failure. Eating less sodium can help lower your blood pressure, reduce swelling, and protect your heart, liver, and kidneys. What are tips for following this plan? General guidelines  Most people on this plan should limit their sodium intake to 2,000 mg (milligrams) of sodium each day. Reading food labels  The Nutrition Facts label lists the amount of sodium in one serving of the food. If you eat more than one serving, you must multiply the listed amount of sodium by the number of servings.  Choose foods with less than 140 mg of sodium per serving.  Avoid foods with 300 mg of sodium or more per serving. Shopping  Look for lower-sodium products, often labeled as "low-sodium" or "no salt added."  Always check the sodium content even if foods are labeled as "unsalted" or "no salt added".  Buy fresh foods. ? Avoid canned foods and premade or frozen meals. ? Avoid canned, cured, or processed meats  Buy breads that have less than 80 mg of sodium per slice. Cooking  Eat more home-cooked food and less restaurant, buffet, and fast food.  Avoid adding salt when cooking. Use salt-free seasonings or herbs instead of table salt or sea salt. Check with your health care provider or pharmacist before using salt substitutes.  Cook with plant-based oils, such as canola, sunflower, or olive oil. Meal planning  When eating at a restaurant, ask that your food  be prepared with less salt or no salt, if possible.  Avoid foods that contain MSG (monosodium glutamate). MSG is sometimes added to [place] food, bouillon, and some canned foods. What foods are recommended? The items listed may not be a complete list. Talk with your dietitian about what dietary choices are best for you. Grains Low-sodium cereals, including oats, puffed wheat and rice, and shredded wheat. Low-sodium crackers. Unsalted rice. Unsalted pasta. Low-sodium bread. Whole-grain breads and whole-grain pasta. Vegetables Fresh or frozen vegetables. "No salt added" canned vegetables. "No salt added" tomato sauce and paste. Low-sodium or reduced-sodium tomato and vegetable juice. Fruits Fresh, frozen, or canned fruit. Fruit juice. Meats and other protein foods Fresh or frozen (no salt added) meat, poultry, seafood, and fish. Low-sodium canned tuna and salmon. Unsalted nuts. Dried peas, beans, and lentils without added salt. Unsalted canned beans. Eggs. Unsalted nut butters. Dairy Milk. Soy milk. Cheese that is naturally low in sodium, such as ricotta cheese, fresh mozzarella, or Swiss cheese Low-sodium or reduced-sodium cheese. Cream cheese. Yogurt. Fats and oils Unsalted butter. Unsalted margarine with no trans fat. Vegetable oils such as canola or olive oils. Seasonings and other foods Fresh and dried herbs and spices. Salt-free seasonings. Low-sodium mustard and ketchup. Sodium-free salad dressing. Sodium-free light mayonnaise. Fresh or refrigerated horseradish. Lemon juice. Vinegar. Homemade, reduced-sodium, or low-sodium soups. Unsalted popcorn and pretzels. Low-salt or salt-free chips. What foods are not recommended? The items listed may not be a complete list. Talk with your dietitian about what dietary choices are  best for you. Grains Instant hot cereals. Bread stuffing, pancake, and biscuit mixes. Croutons. Seasoned rice or pasta mixes. Noodle soup cups. Boxed or frozen macaroni and  cheese. Regular salted crackers. Self-rising flour. Vegetables Sauerkraut, pickled vegetables, and relishes. Olives. [place] fries. Onion rings. Regular canned vegetables (not low-sodium or reduced-sodium). Regular canned tomato sauce and paste (not low-sodium or reduced-sodium). Regular tomato and vegetable juice (not low-sodium or reduced-sodium). Frozen vegetables in sauces. Meats and other protein foods Meat or fish that is salted, canned, smoked, spiced, or pickled. Bacon, ham, sausage, hotdogs, corned beef, chipped beef, packaged lunch meats, salt pork, jerky, pickled herring, anchovies, regular canned tuna, sardines, salted nuts. Dairy Processed cheese and cheese spreads. Cheese curds. Blue cheese. Feta cheese. String cheese. Regular cottage cheese. Buttermilk. Canned milk. Fats and oils Salted butter. Regular margarine. Ghee. Bacon fat. Seasonings and other foods Onion salt, garlic salt, seasoned salt, table salt, and sea salt. Canned and packaged gravies. Worcestershire sauce. Tartar sauce. Barbecue sauce. Teriyaki sauce. Soy sauce, including reduced-sodium. Steak sauce. Fish sauce. Oyster sauce. Cocktail sauce. Horseradish that you find on the shelf. Regular ketchup and mustard. Meat flavorings and tenderizers. Bouillon cubes. Hot sauce and Tabasco sauce. Premade or packaged marinades. Premade or packaged taco seasonings. Relishes. Regular salad dressings. Salsa. Potato and tortilla chips. Corn chips and puffs. Salted popcorn and pretzels. Canned or dried soups. Pizza. Frozen entrees and pot pies. Summary  Eating less sodium can help lower your blood pressure, reduce swelling, and protect your heart, liver, and kidneys.  Most people on this plan should limit their sodium intake to 1,500-2,000 mg (milligrams) of sodium each day.  Canned, boxed, and frozen foods are high in sodium. Restaurant foods, fast foods, and pizza are also very high in sodium. You also get sodium by adding salt to  food.  Try to cook at home, eat more fresh fruits and vegetables, and eat less fast food, canned, processed, or prepared foods. This information is not intended to replace advice given to you by your health care provider. Make sure you discuss any questions you have with your health care provider. Document Released: 07/31/2001 Document Revised: 02/02/2016 Document Reviewed: 02/02/2016 Elsevier Interactive Patient Education  [employer].

## 2017-11-25 NOTE — Progress Notes (Signed)
Patient ID: [name]., male    DOB: Nov 15, 1970, 47 y.o.   MRN: [id number]  HPI  Mr Novelo is a 47 y/o male with a history of DM, hyperlipidemia, HTN, CKD, anxiety and chronic heart failure.   Echo report from 06/29/17 reviewed and showed an EF of 75% along with mild MR.   Admitted 11/08/17 due to acute HF exacerbation with a 20 pound weight gain over previous 2 weeks. Initially needed IV lasix and then transitioned to oral diuretics. Nephrology consult obtained. Discharged after 2 days. Was in the ED 11/07/17 due to abdominal pain where he was treated and released.   He presents today for a follow-up visit with a chief complaint of moderate fatigue upon minimal exertion. He describes this as chronic in nature having been present for several years. He has associated pedal edema, palpitations, abdominal pain, diarrhea, N/V, dizziness and difficulty sleeping along with this. He denies any chest pain, shortness of breath, cough or weight gain. Says that he lost weight after receiving IV lasix last week and felt better but since then, the edema is worsening. Due to the vomiting, he doesn't feel like he's getting the full benefits from his medications. Received 80mg  IV lasix/ 93meq potassium last week.   Past Medical History:  Diagnosis Date  . Anxiety   . Cellulitis   . CHF (congestive heart failure) ([place])   . Chronic diarrhea   . Chronic kidney disease   . Chronic pain of both ankles   . Diabetes mellitus without complication ([employer])   . Diabetes mellitus, type II ([place])   . Diabetic neuropathy ([place])   . Frequent falls   . Gait instability   . Heart palpitations   . HLD (hyperlipidemia)   . Hypertension   . Insomnia   . Nausea and vomiting in adult    recurrent   Past Surgical History:  Procedure Laterality Date  . AMPUTATION Left 03/16/2016   Procedure: AMPUTATION DIGIT LEFT HALLUX;  Surgeon: Trula Slade, DPM;  Location: [facility] [place];  Service: Podiatry;  Laterality: Left;  can  start around 5   . ARTHROSCOPIC REPAIR ACL Left   . COLONOSCOPY WITH PROPOFOL N/A 10/28/2015   Procedure: COLONOSCOPY WITH PROPOFOL;  Surgeon: Lollie Sails, MD;  Location: [facility] ENDOSCOPY;  Service: Endoscopy;  Laterality: N/A;  . COLONOSCOPY WITH PROPOFOL N/A 10/29/2015   Procedure: COLONOSCOPY WITH PROPOFOL;  Surgeon: Lollie Sails, MD;  Location: [facility] ENDOSCOPY;  Service: Endoscopy;  Laterality: N/A;  . PROSTATE SURGERY  2016  . TONSILECTOMY/ADENOIDECTOMY WITH MYRINGOTOMY    . TONSILLECTOMY     Family History  Problem Relation Age of Onset  . CAD Father   . Stroke Father   . Diabetes Mellitus II Mother   . Kidney failure Mother   . Schizophrenia Mother    Social History   Tobacco Use  . Smoking status: Never Smoker  . Smokeless tobacco: Never Used  Substance Use Topics  . Alcohol use: No   Allergies  Allergen Reactions  . Levofloxacin Swelling  . Phenergan [Promethazine Hcl] Other (See Comments)    Cramping   . Malt     Other reaction(s): OTHER  . Soy Allergy Other (See Comments)    Other reaction(s): OTHER   Prior to Admission medications   Medication Sig Start Date End Date Taking? Authorizing Provider  buPROPion (WELLBUTRIN) 75 MG tablet Take 1 tablet (75 mg total) by mouth every morning. 07/05/17  Yes Ursula Alert, MD  calcitRIOL (  ROCALTROL) 0.25 MCG capsule Take 1 capsule by mouth daily. 09/20/17  Yes [provider]  calcium citrate-vitamin D (CITRACAL+D) 315-200 MG-UNIT tablet Take 2 tablets by mouth daily.    Yes [provider]  carvedilol (COREG) 12.5 MG tablet Take 12.5 mg by mouth 2 (two) times daily with a meal.  09/20/17  Yes [provider]  cholestyramine light (PREVALITE) 4 g packet Take 4 g by mouth daily as needed (digestion).  11/17/15  Yes [provider]  citalopram (CELEXA) 20 MG tablet Take 20 mg by mouth daily.  06/16/17  Yes [provider]  dicyclomine (BENTYL) 20 MG tablet Take 1 tablet (20 mg  total) by mouth 3 (three) times daily as needed (abdominal pain). 11/07/17  Yes Nance Pear, MD  diltiazem (CARDIZEM) 120 MG tablet Take 120 mg by mouth daily.    Yes [provider]  FEROSUL 325 (65 Fe) MG tablet Take 325 mg by mouth 2 (two) times daily with a meal.  09/07/17  Yes [provider]  fluticasone (FLONASE) 50 MCG/ACT nasal spray Place 1 spray into both nostrils daily as needed for allergies.  10/06/16  Yes [provider]  furosemide (LASIX) 80 MG tablet Take 1 tablet (80 mg total) by mouth 2 (two) times daily. 11/10/17  Yes Fritzi Mandes, MD  insulin aspart (NOVOLOG) 100 UNIT/ML injection Inject 0-6 Units into the skin 3 (three) times daily before meals. Per sliding scale   Yes [provider]  insulin glargine (LANTUS) 100 UNIT/ML injection Inject 0.15 mLs (15 Units total) into the skin at bedtime. 11/10/17  Yes Fritzi Mandes, MD  lisinopril (PRINIVIL,ZESTRIL) 20 MG tablet Take 20 mg by mouth daily.  10/04/17  Yes [provider]  loratadine (CLARITIN) 10 MG tablet Take 10 mg by mouth daily.   Yes [provider]  metoCLOPramide (REGLAN) 10 MG tablet Take 1 tablet (10 mg total) by mouth every 8 (eight) hours as needed for nausea. 11/07/17 11/07/18 Yes Nance Pear, MD  ondansetron (ZOFRAN-ODT) 4 MG disintegrating tablet Take 4 mg by mouth every 8 (eight) hours as needed for nausea or vomiting.  10/13/17  Yes [provider]  pantoprazole (PROTONIX) 40 MG tablet Take 40 mg by mouth 2 (two) times daily.   Yes [provider]  potassium chloride SA (K-DUR,KLOR-CON) 20 MEQ tablet Take 2 tablets (40 mEq total) by mouth daily. 11/10/17  Yes Fritzi Mandes, MD  rosuvastatin (CRESTOR) 40 MG tablet Take 40 mg by mouth daily.  05/26/17  Yes [provider]  tamsulosin (FLOMAX) 0.4 MG CAPS capsule Take 0.4 mg by mouth daily after breakfast.    Yes [provider]  traZODone (DESYREL) 50 MG tablet Take 0.5-1 tablets  (25-50 mg total) by mouth at bedtime as needed for sleep. 07/05/17  Yes Ursula Alert, MD    Review of Systems  Constitutional: Positive for appetite change (decreased) and fatigue (tire easily).  HENT: Negative for congestion, postnasal drip and sore throat.   Eyes: Negative.   Respiratory: Negative for chest tightness and shortness of breath.   Cardiovascular: Positive for palpitations (at times) and leg swelling. Negative for chest pain.  Gastrointestinal: Positive for abdominal pain, diarrhea, nausea and vomiting (3-4 times/ daily after eating). Negative for abdominal distention.  Endocrine: Negative.   Genitourinary: Negative.   Musculoskeletal: Negative for back pain and neck pain.  Skin: Negative.   Allergic/Immunologic: Negative.   Neurological: Positive for dizziness (at times). Negative for light-headedness.  Hematological: Negative for  adenopathy. Does not bruise/bleed easily.  Psychiatric/Behavioral: Positive for sleep disturbance. Negative for dysphoric mood. The patient is not nervous/anxious.    Vitals:   11/28/17 1140  BP: (!) 171/106  Pulse: 81  Resp: 18  SpO2: 99%  Weight: 231 lb 4 oz (104.9 kg)  Height: 6\' 3"  (1.905 m)   Wt Readings from Last 3 Encounters:  11/28/17 231 lb 4 oz (104.9 kg)  11/23/17 235 lb 6 oz (106.8 kg)  11/10/17 233 lb 3.2 oz (105.8 kg)   Lab Results  Component Value Date   CREATININE 2.34 (H) 11/23/2017   CREATININE 2.03 (H) 11/10/2017   CREATININE 2.24 (H) 11/09/2017   Physical Exam  Constitutional: He is oriented to person, place, and time. He appears well-developed and well-nourished.  HENT:  Head: Normocephalic and atraumatic.  Neck: Normal range of motion. Neck supple. No JVD present.  Cardiovascular: Normal rate and regular rhythm.  Pulmonary/Chest: Effort normal. He has no wheezes. He has no rales.  Abdominal: Soft. He exhibits no distension. There is tenderness.  Musculoskeletal: He exhibits edema (3+ pitting bilateral  lower legs). He exhibits no tenderness.  Neurological: He is alert and oriented to person, place, and time.  Skin: Skin is warm and dry.  Psychiatric: He has a normal mood and affect. His behavior is normal. Thought content normal.  Nursing note and vitals reviewed.  Assessment & Plan:  1: Acute on Chronic heart failure with preserved ejection fraction- - NYHA class III - moderately fluid overloaded still - weighing daily and he was reminded to call for an overnight weight gain of >2 pounds or a weekly weight gain of >5 pounds - weight down 4 pounds since he was last here 5 days ago - not adding salt to his food and occasionally reads food labels. Reviewed the importance of closely following a 2000mg  sodium diet  - will send back for another 80mg  IV lasix/ 31meq potassium; may need sooner nephrology appointment depending on renal status - BNP/ BMP to be drawn today - BNP 11/23/17 was 25.0  2: HTN- - BP elevated but he thinks it's because his body isn't absorbing his medications because of the vomiting - sees PCP [name] Darter)  - BMP 11/23/17 reviewed and showed sodium 134, potassium 3.6, creatinine 2.34 and GFR 36  3: DM- - says that his glucose this morning was 298 - sees nephrology ([facility]) - A1c 11/09/17 was 15.2%  4: Vomiting- - says that he has this 3-4 times/ day along with vomiting after eating - he's concerned that his body isn't absorbing his medications due to the vomiting - saw GI Ellin Mayhew) 11/03/17 - has gastric emptying study scheduled 10/12 and UGI/SBFT on 10/15  Patient did not bring his medications nor a list. Each medication was verbally reviewed with the patient and hhe was encouraged to bring the bottles to every visit to confirm accuracy of list.  Return in 2 weeks or sooner for any questions/problems before then.

## 2017-11-28 ENCOUNTER — Ambulatory Visit: Payer: BC Managed Care – PPO | Admitting: Family

## 2017-11-28 ENCOUNTER — Encounter: Payer: Self-pay | Admitting: Family

## 2017-11-28 ENCOUNTER — Other Ambulatory Visit: Payer: Self-pay | Admitting: Family

## 2017-11-28 ENCOUNTER — Ambulatory Visit
Admission: RE | Admit: 2017-11-28 | Discharge: 2017-11-28 | Disposition: A | Discharge status: Home / Self Care | Payer: BC Managed Care – PPO | Source: Ambulatory Visit | Attending: Family | Admitting: Family

## 2017-11-28 VITALS — BP 171/106 | HR 81 | Resp 18 | Ht 75.0 in | Wt 231.2 lb

## 2017-11-28 DIAGNOSIS — Z8249 Family history of ischemic heart disease and other diseases of the circulatory system: Secondary | ICD-10-CM | POA: Insufficient documentation

## 2017-11-28 DIAGNOSIS — E785 Hyperlipidemia, unspecified: Secondary | ICD-10-CM | POA: Insufficient documentation

## 2017-11-28 DIAGNOSIS — I10 Essential (primary) hypertension: Secondary | ICD-10-CM

## 2017-11-28 DIAGNOSIS — F419 Anxiety disorder, unspecified: Secondary | ICD-10-CM | POA: Insufficient documentation

## 2017-11-28 DIAGNOSIS — G8929 Other chronic pain: Secondary | ICD-10-CM | POA: Insufficient documentation

## 2017-11-28 DIAGNOSIS — I5033 Acute on chronic diastolic (congestive) heart failure: Secondary | ICD-10-CM | POA: Diagnosis not present

## 2017-11-28 DIAGNOSIS — R1110 Vomiting, unspecified: Secondary | ICD-10-CM | POA: Diagnosis not present

## 2017-11-28 DIAGNOSIS — G4700 Insomnia, unspecified: Secondary | ICD-10-CM | POA: Diagnosis not present

## 2017-11-28 DIAGNOSIS — M25572 Pain in left ankle and joints of left foot: Secondary | ICD-10-CM | POA: Diagnosis not present

## 2017-11-28 DIAGNOSIS — E1122 Type 2 diabetes mellitus with diabetic chronic kidney disease: Secondary | ICD-10-CM | POA: Insufficient documentation

## 2017-11-28 DIAGNOSIS — Z79899 Other long term (current) drug therapy: Secondary | ICD-10-CM | POA: Diagnosis not present

## 2017-11-28 DIAGNOSIS — N189 Chronic kidney disease, unspecified: Secondary | ICD-10-CM | POA: Insufficient documentation

## 2017-11-28 DIAGNOSIS — E1140 Type 2 diabetes mellitus with diabetic neuropathy, unspecified: Secondary | ICD-10-CM | POA: Diagnosis not present

## 2017-11-28 DIAGNOSIS — M25571 Pain in right ankle and joints of right foot: Secondary | ICD-10-CM | POA: Insufficient documentation

## 2017-11-28 DIAGNOSIS — E1165 Type 2 diabetes mellitus with hyperglycemia: Secondary | ICD-10-CM

## 2017-11-28 DIAGNOSIS — R112 Nausea with vomiting, unspecified: Secondary | ICD-10-CM

## 2017-11-28 DIAGNOSIS — I130 Hypertensive heart and chronic kidney disease with heart failure and stage 1 through stage 4 chronic kidney disease, or unspecified chronic kidney disease: Secondary | ICD-10-CM | POA: Diagnosis not present

## 2017-11-28 DIAGNOSIS — I5031 Acute diastolic (congestive) heart failure: Secondary | ICD-10-CM

## 2017-11-28 DIAGNOSIS — Z794 Long term (current) use of insulin: Secondary | ICD-10-CM | POA: Diagnosis not present

## 2017-11-28 DIAGNOSIS — Z7951 Long term (current) use of inhaled steroids: Secondary | ICD-10-CM | POA: Diagnosis not present

## 2017-11-28 DIAGNOSIS — Z833 Family history of diabetes mellitus: Secondary | ICD-10-CM | POA: Diagnosis not present

## 2017-11-28 DIAGNOSIS — I509 Heart failure, unspecified: Secondary | ICD-10-CM | POA: Diagnosis present

## 2017-11-28 LAB — BRAIN NATRIURETIC PEPTIDE: B Natriuretic Peptide: 29 pg/mL (ref 0.0–100.0)

## 2017-11-28 LAB — BASIC METABOLIC PANEL
Anion gap: 11 (ref 5–15)
BUN: 43 mg/dL — AB (ref 6–20)
CHLORIDE: 94 mmol/L — AB (ref 98–111)
CO2: 30 mmol/L (ref 22–32)
CREATININE: 3.01 mg/dL — AB (ref 0.61–1.24)
Calcium: 8 mg/dL — ABNORMAL LOW (ref 8.9–10.3)
GFR calc Af Amer: 26 mL/min — ABNORMAL LOW (ref >60)
GFR calc non Af Amer: 23 mL/min — ABNORMAL LOW (ref >60)
Glucose, Bld: 480 mg/dL — ABNORMAL HIGH (ref 70–99)
POTASSIUM: 3.9 mmol/L (ref 3.5–5.1)
SODIUM: 135 mmol/L (ref 135–145)

## 2017-11-28 MED ORDER — POTASSIUM CHLORIDE CRYS ER 20 MEQ PO TBCR
40.0000 meq | EXTENDED_RELEASE_TABLET | Freq: Once | ORAL | Status: AC
  Administered 2017-11-28: 40 meq via ORAL

## 2017-11-28 MED ORDER — SODIUM CHLORIDE FLUSH 0.9 % IV SOLN
INTRAVENOUS | Status: AC
  Filled 2017-11-28: qty 10

## 2017-11-28 MED ORDER — FUROSEMIDE 10 MG/ML IJ SOLN
INTRAMUSCULAR | Status: AC
  Filled 2017-11-28: qty 8

## 2017-11-28 MED ORDER — FUROSEMIDE 10 MG/ML IJ SOLN
80.0000 mg | Freq: Once | INTRAMUSCULAR | Status: AC
  Administered 2017-11-28: 80 mg via INTRAVENOUS

## 2017-11-28 MED ORDER — POTASSIUM CHLORIDE CRYS ER 20 MEQ PO TBCR
EXTENDED_RELEASE_TABLET | ORAL | Status: AC
  Filled 2017-11-28: qty 2

## 2017-11-28 NOTE — Patient Instructions (Signed)
Continue weighing daily and call for an overnight weight gain of > 2 pounds or a weekly weight gain of >5 pounds. 

## 2017-11-29 ENCOUNTER — Ambulatory Visit: Payer: BC Managed Care – PPO | Admitting: Licensed Clinical Social Worker

## 2017-12-03 ENCOUNTER — Ambulatory Visit
Admission: RE | Admit: 2017-12-03 | Discharge: 2017-12-03 | Disposition: A | Discharge status: Home / Self Care | Payer: BC Managed Care – PPO | Source: Ambulatory Visit | Attending: Gastroenterology | Admitting: Gastroenterology

## 2017-12-03 DIAGNOSIS — R112 Nausea with vomiting, unspecified: Secondary | ICD-10-CM | POA: Diagnosis present

## 2017-12-03 DIAGNOSIS — K30 Functional dyspepsia: Secondary | ICD-10-CM | POA: Diagnosis not present

## 2017-12-03 MED ORDER — TECHNETIUM TC 99M SULFUR COLLOID
2.9200 | Freq: Once | INTRAVENOUS | Status: AC | PRN
  Administered 2017-12-03: 2.92 via ORAL

## 2017-12-03 MED ORDER — TECHNETIUM TC 99M SULFUR COLLOID: 2.9200 | Freq: Once | INTRAVENOUS | Status: DC | PRN

## 2017-12-06 ENCOUNTER — Ambulatory Visit
Admission: RE | Admit: 2017-12-06 | Discharge: 2017-12-06 | Disposition: A | Discharge status: Home / Self Care | Payer: BC Managed Care – PPO | Source: Ambulatory Visit | Attending: Gastroenterology | Admitting: Gastroenterology

## 2017-12-06 DIAGNOSIS — R112 Nausea with vomiting, unspecified: Secondary | ICD-10-CM | POA: Insufficient documentation

## 2017-12-09 ENCOUNTER — Telehealth: Payer: Self-pay

## 2017-12-09 NOTE — Telephone Encounter (Signed)
[name] called the office to report an increase of 7 lbs in the last week. He has been taking his oral diuretic as prescribed however he has been experiencing severe vomiting and says he can not keep it or anything down long enough to work. He does report shortness of breath and edema.  He would like to know if he can come today to have IV Lasix done outpatient to pull the fluid off.   Spoke with [name] Price FNP who states she would not be comfortable using IV lasix at this point due to [name] recent lab work showing a creatinine of 3.01. She states that patient may need to be seen by Nephrology [name]) or be seen in the ED where they could administer the IV and monitor his kidney function more closely.   I explained to the patient why we could not do outpatient lasix at this time and advised pt on Tina's instructions. He is going to contact Kolluru's office about an appointment and if not will go to the ED.

## 2017-12-13 ENCOUNTER — Encounter
Admission: RE | Disposition: A | Discharge status: Home / Self Care | Payer: Self-pay | Source: Ambulatory Visit | Attending: Gastroenterology

## 2017-12-13 ENCOUNTER — Ambulatory Visit: Payer: BC Managed Care – PPO

## 2017-12-13 ENCOUNTER — Ambulatory Visit
Admission: RE | Admit: 2017-12-13 | Discharge: 2017-12-13 | Disposition: A | Discharge status: Home / Self Care | Payer: BC Managed Care – PPO | Source: Ambulatory Visit | Attending: Gastroenterology | Admitting: Gastroenterology

## 2017-12-13 ENCOUNTER — Encounter: Payer: Self-pay | Admitting: Family

## 2017-12-13 ENCOUNTER — Ambulatory Visit: Payer: BC Managed Care – PPO | Admitting: Anesthesiology

## 2017-12-13 ENCOUNTER — Ambulatory Visit: Payer: BC Managed Care – PPO | Admitting: Family

## 2017-12-13 ENCOUNTER — Other Ambulatory Visit: Payer: Self-pay

## 2017-12-13 VITALS — BP 155/106 | HR 90 | Resp 18 | Ht 75.0 in | Wt 236.1 lb

## 2017-12-13 DIAGNOSIS — R112 Nausea with vomiting, unspecified: Secondary | ICD-10-CM | POA: Diagnosis not present

## 2017-12-13 DIAGNOSIS — E1165 Type 2 diabetes mellitus with hyperglycemia: Secondary | ICD-10-CM

## 2017-12-13 DIAGNOSIS — I5032 Chronic diastolic (congestive) heart failure: Secondary | ICD-10-CM | POA: Insufficient documentation

## 2017-12-13 DIAGNOSIS — I130 Hypertensive heart and chronic kidney disease with heart failure and stage 1 through stage 4 chronic kidney disease, or unspecified chronic kidney disease: Secondary | ICD-10-CM | POA: Diagnosis not present

## 2017-12-13 DIAGNOSIS — Z794 Long term (current) use of insulin: Secondary | ICD-10-CM | POA: Insufficient documentation

## 2017-12-13 DIAGNOSIS — I10 Essential (primary) hypertension: Secondary | ICD-10-CM

## 2017-12-13 DIAGNOSIS — Z5309 Procedure and treatment not carried out because of other contraindication: Secondary | ICD-10-CM | POA: Diagnosis not present

## 2017-12-13 DIAGNOSIS — Z79899 Other long term (current) drug therapy: Secondary | ICD-10-CM | POA: Diagnosis not present

## 2017-12-13 DIAGNOSIS — E1122 Type 2 diabetes mellitus with diabetic chronic kidney disease: Secondary | ICD-10-CM | POA: Diagnosis not present

## 2017-12-13 DIAGNOSIS — N189 Chronic kidney disease, unspecified: Secondary | ICD-10-CM | POA: Insufficient documentation

## 2017-12-13 DIAGNOSIS — E1140 Type 2 diabetes mellitus with diabetic neuropathy, unspecified: Secondary | ICD-10-CM | POA: Diagnosis not present

## 2017-12-13 LAB — GLUCOSE, CAPILLARY: Glucose-Capillary: 164 mg/dL — ABNORMAL HIGH (ref 70–99)

## 2017-12-13 SURGERY — EGD (ESOPHAGOGASTRODUODENOSCOPY)
Anesthesia: General

## 2017-12-13 MED ORDER — PROPOFOL 500 MG/50ML IV EMUL
INTRAVENOUS | Status: AC
  Filled 2017-12-13: qty 50

## 2017-12-13 MED ORDER — SODIUM CHLORIDE 0.9 % IV SOLN: INTRAVENOUS | Status: DC

## 2017-12-13 MED ORDER — FENTANYL CITRATE (PF) 100 MCG/2ML IJ SOLN
INTRAMUSCULAR | Status: AC
  Filled 2017-12-13: qty 2

## 2017-12-13 NOTE — H&P (View-Only) (Signed)
Patient is a 47 year old male presenting today for an EGD.  However on review of his medications stated he had been taking Alka-Seltzer's 4 tablets daily including through yesterday.  As such she is about 1300 mg of aspirin daily and could indeed be the issue with his abdominal pain area and he has been having no hematemesis.  Occasional dark stool but not recently.  Gust this at length with him today.  As such will not be able to proceed with this elective procedure.  He is to continue on a twice daily PPI add Carafate 1 g p.o. 4 times daily and his aspirin and we will reschedule him.

## 2017-12-13 NOTE — Anesthesia Preprocedure Evaluation (Signed)
Anesthesia Evaluation  Patient identified by MRN, date of birth, ID band Patient awake    Reviewed: Allergy & Precautions, NPO status , Patient's Chart, lab work & pertinent test results  History of Anesthesia Complications Negative for: history of anesthetic complications  Airway Mallampati: II       Dental   Pulmonary neg sleep apnea, neg COPD,           Cardiovascular hypertension, Pt. on medications +CHF (hx)  (-) Past MI (-) dysrhythmias (-) Valvular Problems/Murmurs     Neuro/Psych neg Seizures Anxiety    GI/Hepatic Neg liver ROS, GERD  ,  Endo/Other  diabetes, Type 2, Insulin Dependent  Renal/GU Renal InsufficiencyRenal disease     Musculoskeletal   Abdominal   Peds  Hematology   Anesthesia Other Findings   Reproductive/Obstetrics                             Anesthesia Physical Anesthesia Plan  ASA: III  Anesthesia Plan: General   Post-op Pain Management:    Induction: Intravenous  PONV Risk Score and Plan: 2  Airway Management Planned: Nasal Cannula  Additional Equipment:   Intra-op Plan:   Post-operative Plan:   Informed Consent: I have reviewed the patients History and Physical, chart, labs and discussed the procedure including the risks, benefits and alternatives for the proposed anesthesia with the patient or authorized representative who has indicated his/her understanding and acceptance.     Plan Discussed with:   Anesthesia Plan Comments:         Anesthesia Quick Evaluation

## 2017-12-13 NOTE — Progress Notes (Signed)
Patient ID: [name]., male    DOB: [birth year], 47 y.o.   MRN: [id number]  HPI  [name] is a 47 y/o male with a history of DM, hyperlipidemia, HTN, CKD, anxiety and chronic heart failure.   Echo report from 06/29/17 reviewed and showed an EF of 75% along with mild [name].   Admitted 11/08/17 due to acute HF exacerbation with a 20 pound weight gain over previous 2 weeks. Initially needed IV lasix and then transitioned to oral diuretics. Nephrology consult obtained. Discharged after 2 days. Was in the ED 11/07/17 due to abdominal pain where he was treated and released.   He presents today for a follow-up visit with a chief complaint of moderate fatigue upon minimal exertion. He describes this as chronic in nature having been present for several months. He has associated pedal edema, palpitations, abdominal pain, nausea, vomiting, difficulty sleeping and continued gradual weight gain along with this. He denies abdominal distention, shortness of breath or dizziness. Has had a couple of GI tests done and is having EGD done later today. Still has frequent episodes of vomiting that occurs when he eats or drinks anything which results in an inability to keep his medications down.   Past Medical History:  Diagnosis Date  . Anxiety   . Cellulitis   . CHF (congestive heart failure) ([place])   . Chronic diarrhea   . Chronic kidney disease   . Chronic pain of both ankles   . Diabetes mellitus without complication ([place])   . Diabetes mellitus, type II ([place])   . Diabetic neuropathy ([place])   . Frequent falls   . Gait instability   . Heart palpitations   . HLD (hyperlipidemia)   . Hypertension   . Insomnia   . Nausea and vomiting in adult    recurrent   Past Surgical History:  Procedure Laterality Date  . AMPUTATION Left 03/16/2016   Procedure: AMPUTATION DIGIT LEFT HALLUX;  Surgeon: Trula Slade, DPM;  Location: [facility];  Service: Podiatry;  Laterality: Left;  can start around 5   .  ARTHROSCOPIC REPAIR ACL Left   . COLONOSCOPY WITH PROPOFOL N/A 10/28/2015   Procedure: COLONOSCOPY WITH PROPOFOL;  Surgeon: Lollie Sails, MD;  Location: [name] ENDOSCOPY;  Service: Endoscopy;  Laterality: N/A;  . COLONOSCOPY WITH PROPOFOL N/A 10/29/2015   Procedure: COLONOSCOPY WITH PROPOFOL;  Surgeon: Lollie Sails, MD;  Location: [facility] ENDOSCOPY;  Service: Endoscopy;  Laterality: N/A;  . PROSTATE SURGERY  2016  . TONSILECTOMY/ADENOIDECTOMY WITH MYRINGOTOMY    . TONSILLECTOMY     Family History  Problem Relation Age of Onset  . CAD Father   . Stroke Father   . Diabetes Mellitus II Mother   . Kidney failure Mother   . Schizophrenia Mother    Social History   Tobacco Use  . Smoking status: Never Smoker  . Smokeless tobacco: Never Used  Substance Use Topics  . Alcohol use: No   Allergies  Allergen Reactions  . Levofloxacin Swelling  . Phenergan [Promethazine Hcl] Other (See Comments)    Cramping   . Malt     Other reaction(s): OTHER  . Soy Allergy Other (See Comments)    Other reaction(s): OTHER   Prior to Admission medications   Medication Sig Start Date End Date Taking? Authorizing Provider  buPROPion (WELLBUTRIN) 75 MG tablet Take 1 tablet (75 mg total) by mouth every morning. 07/05/17  Yes Ursula Alert, MD  calcitRIOL (ROCALTROL) 0.25 MCG capsule Take 1  capsule by mouth daily. 09/20/17  Yes [provider]  calcium citrate-vitamin D (CITRACAL+D) 315-200 MG-UNIT tablet Take 2 tablets by mouth daily.    Yes [provider]  carvedilol (COREG) 12.5 MG tablet Take 12.5 mg by mouth daily.  09/20/17  Yes [provider]  cholestyramine light (PREVALITE) 4 g packet Take 4 g by mouth daily as needed (digestion).  11/17/15  Yes [provider]  citalopram (CELEXA) 20 MG tablet Take 20 mg by mouth daily.  06/16/17  Yes [provider]  FEROSUL 325 (65 Fe) MG tablet Take 325 mg by mouth 2 (two) times daily with a meal.  09/07/17  Yes  [provider]  fluticasone (FLONASE) 50 MCG/ACT nasal spray Place 1 spray into both nostrils daily as needed for allergies.  10/06/16  Yes [provider]  furosemide (LASIX) 80 MG tablet Take 1 tablet (80 mg total) by mouth 2 (two) times daily. 11/10/17  Yes Fritzi Mandes, MD  insulin aspart (NOVOLOG) 100 UNIT/ML injection Inject 0-6 Units into the skin 3 (three) times daily before meals. Per sliding scale   Yes [provider]  insulin glargine (LANTUS) 100 UNIT/ML injection Inject 0.15 mLs (15 Units total) into the skin at bedtime. 11/10/17  Yes Fritzi Mandes, MD  lisinopril (PRINIVIL,ZESTRIL) 20 MG tablet Take 20 mg by mouth 2 (two) times daily.  10/04/17  Yes [provider]  metoCLOPramide (REGLAN) 10 MG tablet Take 1 tablet (10 mg total) by mouth every 8 (eight) hours as needed for nausea. 11/07/17 11/07/18 Yes Nance Pear, MD  ondansetron (ZOFRAN-ODT) 4 MG disintegrating tablet Take 4 mg by mouth every 8 (eight) hours as needed for nausea or vomiting.  10/13/17  Yes [provider]  potassium chloride SA (K-DUR,KLOR-CON) 20 MEQ tablet Take 2 tablets (40 mEq total) by mouth daily. 11/10/17  Yes Fritzi Mandes, MD  rosuvastatin (CRESTOR) 40 MG tablet Take 40 mg by mouth daily.  05/26/17  Yes [provider]  tamsulosin (FLOMAX) 0.4 MG CAPS capsule Take 0.4 mg by mouth daily after breakfast.    Yes [provider]  dicyclomine (BENTYL) 20 MG tablet Take 1 tablet (20 mg total) by mouth 3 (three) times daily as needed (abdominal pain). Patient not taking: Reported on 12/13/2017 11/07/17   Nance Pear, MD  diltiazem (CARDIZEM) 120 MG tablet Take 120 mg by mouth daily.     [provider]    Review of Systems  Constitutional: Positive for appetite change (decreased) and fatigue.  HENT: Negative for congestion, postnasal drip and sore throat.   Eyes: Negative.   Respiratory: Negative for chest tightness and shortness of breath.    Cardiovascular: Positive for palpitations and leg swelling.  Gastrointestinal: Positive for abdominal pain, diarrhea, nausea and vomiting (3-4 times/ daily after eating). Negative for abdominal distention.  Endocrine: Negative.   Genitourinary: Negative.   Musculoskeletal: Negative for back pain and neck pain.  Skin: Negative.   Allergic/Immunologic: Negative.   Neurological: Negative for dizziness and light-headedness.  Hematological: Negative for adenopathy. Does not bruise/bleed easily.  Psychiatric/Behavioral: Positive for sleep disturbance. Negative for dysphoric mood. The patient is not nervous/anxious.    Vitals:   12/13/17 0956  BP: (!) 155/106  Pulse: 90  Resp: 18  SpO2: 99%  Weight: 236 lb 2 oz (107.1 kg)  Height: 6\' 3"  (1.905 m)   Wt Readings from Last 3 Encounters:  12/13/17 236 lb 2 oz (107.1 kg)  11/28/17 231 lb 4 oz (104.9 kg)  11/23/17 235 lb 6 oz (106.8 kg)   Lab Results  Component Value Date   CREATININE 3.01 (H) 11/28/2017   CREATININE 2.34 (H) 11/23/2017   CREATININE 2.03 (H) 11/10/2017   Physical Exam  Constitutional: He is oriented to person, place, and time. He appears well-developed and well-nourished.  HENT:  Head: Normocephalic and atraumatic.  Neck: Normal range of motion. Neck supple. No JVD present.  Cardiovascular: Normal rate and regular rhythm.  Pulmonary/Chest: Effort normal. He has no wheezes. He has no rales.  Abdominal: Soft. He exhibits no distension. There is tenderness.  Musculoskeletal: He exhibits edema (3+ pitting bilateral lower legs). He exhibits no tenderness.  Neurological: He is alert and oriented to person, place, and time.  Skin: Skin is warm and dry.  Psychiatric: He has a normal mood and affect. His behavior is normal. Thought content normal.  Nursing note and vitals reviewed.  Assessment & Plan:  1: Chronic heart failure with preserved ejection fraction- - NYHA class III - remains moderately fluid overloaded  -  weighing daily and he was reminded to call for an overnight weight gain of >2 pounds or a weekly weight gain of >5 pounds - weight up 5 pounds since last visit here 2 weeks ago - not adding salt to his food and occasionally reads food labels. Reviewed the importance of closely following a 2000mg  sodium diet  - due to worsening renal disease, explained that I wasn't comfortable giving more IV lasix but that nephrology might. However, until the underlying cause of his frequent vomiting is diagnosed, not sure how much better he can get since he's not absorbing his medications. Patient was in agreement with that statement.  - could consider lymphapress compression boots in the future - BNP 11/28/17 was 29.0 - PharmD reconciled medications with the patient  2: HTN- - BP elevated but most likely it's because he's having such frequent vomiting that he can't hold his medications down - sees PCP [name] Darter) & returns in a couple of weeks - BMP 11/28/17 reviewed and showed sodium 135, potassium 3.9, creatinine 3.01 and GFR 26  3: DM- - says that his glucose this morning was 187 - sees nephrology [facility]) tomorrow - sees endocrinologist in the next 1-2 weeks - A1c 11/09/17 was 15.2%  4: Vomiting- - says that he has this 3-4 times/ day along with vomiting after eating/ drinking liquids - wonder if he has diabetic gastroparesis - he's concerned that his body isn't absorbing his medications due to the vomiting - saw GI Ellin Mayhew) 11/03/17 - had gastric emptying study 10/12 and UGI/SBFT on 12/06/17 - has EGD scheduled for later today  Patient did not bring his medications nor a list. Each medication was verbally reviewed with the patient and he was encouraged to bring the bottles to every visit to confirm accuracy of list.  Return in 1 month or sooner for any questions/problems before then.

## 2017-12-13 NOTE — Progress Notes (Signed)
Per Dr. Gustavo Lah...EGD cancelled due to patient taking Alka Seltzer which has aspirin.Marland KitchenMarland KitchenMarland Kitchen

## 2017-12-13 NOTE — Patient Instructions (Signed)
Continue weighing daily and call for an overnight weight gain of > 2 pounds or a weekly weight gain of >5 pounds. 

## 2017-12-13 NOTE — H&P (Signed)
Patient is a 47 year old male presenting today for an EGD.  However on review of his medications stated he had been taking Alka-Seltzer's 4 tablets daily including through yesterday.  As such she is about 1300 mg of aspirin daily and could indeed be the issue with his abdominal pain area and he has been having no hematemesis.  Occasional dark stool but not recently.  Gust this at length with him today.  As such will not be able to proceed with this elective procedure.  He is to continue on a twice daily PPI add Carafate 1 g p.o. 4 times daily and his aspirin and we will reschedule him.

## 2017-12-16 ENCOUNTER — Encounter: Payer: Self-pay | Admitting: Emergency Medicine

## 2017-12-16 ENCOUNTER — Other Ambulatory Visit: Payer: Self-pay

## 2017-12-16 ENCOUNTER — Ambulatory Visit: Payer: BC Managed Care – PPO

## 2017-12-16 DIAGNOSIS — E1140 Type 2 diabetes mellitus with diabetic neuropathy, unspecified: Secondary | ICD-10-CM | POA: Insufficient documentation

## 2017-12-16 DIAGNOSIS — I5032 Chronic diastolic (congestive) heart failure: Secondary | ICD-10-CM | POA: Diagnosis not present

## 2017-12-16 DIAGNOSIS — E11319 Type 2 diabetes mellitus with unspecified diabetic retinopathy without macular edema: Secondary | ICD-10-CM | POA: Diagnosis not present

## 2017-12-16 DIAGNOSIS — N189 Chronic kidney disease, unspecified: Secondary | ICD-10-CM | POA: Diagnosis not present

## 2017-12-16 DIAGNOSIS — E1122 Type 2 diabetes mellitus with diabetic chronic kidney disease: Secondary | ICD-10-CM | POA: Insufficient documentation

## 2017-12-16 DIAGNOSIS — I130 Hypertensive heart and chronic kidney disease with heart failure and stage 1 through stage 4 chronic kidney disease, or unspecified chronic kidney disease: Secondary | ICD-10-CM | POA: Insufficient documentation

## 2017-12-16 DIAGNOSIS — Z794 Long term (current) use of insulin: Secondary | ICD-10-CM | POA: Diagnosis not present

## 2017-12-16 DIAGNOSIS — Z79899 Other long term (current) drug therapy: Secondary | ICD-10-CM | POA: Insufficient documentation

## 2017-12-16 DIAGNOSIS — R600 Localized edema: Secondary | ICD-10-CM | POA: Diagnosis not present

## 2017-12-16 DIAGNOSIS — R2243 Localized swelling, mass and lump, lower limb, bilateral: Secondary | ICD-10-CM | POA: Diagnosis present

## 2017-12-16 NOTE — ED Triage Notes (Signed)
Pt reports he has CHF reports normal weight 220lbs, reports today he checked was about 240lbs reports he is not able to take medications as he is having GI problems food only sits in stomach not able to digest. Pt reports was admitted [facility] 2 week sfor the same to get IV diuretics. Pt talks in complete sentences no respiratory distress noted

## 2017-12-17 ENCOUNTER — Emergency Department
Admission: EM | Admit: 2017-12-17 | Discharge: 2017-12-17 | Disposition: A | Discharge status: Home / Self Care | Payer: BC Managed Care – PPO | Attending: Emergency Medicine | Admitting: Emergency Medicine

## 2017-12-17 ENCOUNTER — Emergency Department: Payer: BC Managed Care – PPO

## 2017-12-17 DIAGNOSIS — R609 Edema, unspecified: Secondary | ICD-10-CM

## 2017-12-17 DIAGNOSIS — I10 Essential (primary) hypertension: Secondary | ICD-10-CM

## 2017-12-17 LAB — CBC
HCT: 31.4 % — ABNORMAL LOW (ref 39.0–52.0)
Hemoglobin: 9.7 g/dL — ABNORMAL LOW (ref 13.0–17.0)
MCH: 22.7 pg — ABNORMAL LOW (ref 26.0–34.0)
MCHC: 30.9 g/dL (ref 30.0–36.0)
MCV: 73.4 fL — AB (ref 80.0–100.0)
NRBC: 0 % (ref 0.0–0.2)
PLATELETS: 259 10*3/uL (ref 150–400)
RBC: 4.28 MIL/uL (ref 4.22–5.81)
RDW: 14.4 % (ref 11.5–15.5)
WBC: 7.6 10*3/uL (ref 4.0–10.5)

## 2017-12-17 LAB — BASIC METABOLIC PANEL
Anion gap: 9 (ref 5–15)
BUN: 41 mg/dL — ABNORMAL HIGH (ref 6–20)
CALCIUM: 8 mg/dL — AB (ref 8.9–10.3)
CO2: 25 mmol/L (ref 22–32)
CREATININE: 2.29 mg/dL — AB (ref 0.61–1.24)
Chloride: 104 mmol/L (ref 98–111)
GFR calc non Af Amer: 32 mL/min — ABNORMAL LOW (ref >60)
GFR, EST AFRICAN AMERICAN: 37 mL/min — AB (ref >60)
Glucose, Bld: 434 mg/dL — ABNORMAL HIGH (ref 70–99)
Potassium: 4.1 mmol/L (ref 3.5–5.1)
Sodium: 138 mmol/L (ref 135–145)

## 2017-12-17 LAB — TROPONIN I: Troponin I: <0.03 ng/mL (ref <0.03)

## 2017-12-17 LAB — BRAIN NATRIURETIC PEPTIDE: B NATRIURETIC PEPTIDE 5: 32 pg/mL (ref 0.0–100.0)

## 2017-12-17 MED ORDER — FUROSEMIDE 10 MG/ML IJ SOLN
100.0000 mg | Freq: Once | INTRAVENOUS | Status: AC
  Administered 2017-12-17: 100 mg via INTRAVENOUS
  Filled 2017-12-17 (×2): qty 10

## 2017-12-17 MED ORDER — FUROSEMIDE 10 MG/ML IJ SOLN
100.0000 mg | Freq: Once | INTRAVENOUS | Status: AC
  Administered 2017-12-17: 100 mg via INTRAVENOUS
  Filled 2017-12-17: qty 10

## 2017-12-17 NOTE — ED Provider Notes (Addendum)
[facility] Emergency Department Provider Note   ____________________________________________   First MD Initiated Contact with Patient 12/17/17 [phone]     (approximate)  I have reviewed the triage vital signs and the nursing notes.   HISTORY  Chief Complaint Congestive Heart Failure and Leg Swelling ([place] )    HPI [name]. is a 47 y.o. male patient reports his stomach is not pushing through his medicines and he is not absorbing his medicines and he vomited up his medicines as well.  He is not nauseated or vomiting anymore.  He has been diabetic for about 19 years.  He is scheduled to have a EGD done.  However he says in the meantime he is gained 20 pounds in his legs are swelling up a lot and he does have a history of congestive failure.  He says in the past he had to come in for IV Lasix as he was not absorbing his Lasix.  Here in the emergency room I gave him 100 mg of Lasix and he put out about 2 L of urine.  I will give him another 100 of Lasix and discharge him home.  He will follow-up with his family doctor and gastroenterology he already has his EGD scheduled.  He will return here if he has any further problems at all.  Patient's blood pressure was 295 systolic when he got here last blood pressure that we took was 284 systolic.  This is much better.  Anticipate will drop somewhat more as his fluid comes out.   Past Medical History:  Diagnosis Date  . Anxiety   . Cellulitis   . CHF (congestive heart failure) ([place])   . Chronic diarrhea   . Chronic kidney disease   . Chronic pain of both ankles   . Diabetes mellitus without complication ([place])   . Diabetes mellitus, type II ([place])   . Diabetic neuropathy ([place])   . Frequent falls   . Gait instability   . Heart palpitations   . HLD (hyperlipidemia)   . Hypertension   . Insomnia   . Nausea and vomiting in adult    recurrent    Patient Active Problem List   Diagnosis Date Noted    . Chronic diastolic heart failure ([place]) 12/13/2017  . Vomiting 11/28/2017  . Acute on chronic heart failure ([place]) 11/23/2017  . Diabetes ([place]) 11/23/2017  . Diabetic retinopathy ([place]) 07/05/2017  . Uncontrolled diabetes mellitus ([place]) 07/08/2016  . Uncontrolled type 2 diabetes mellitus with hyperglycemia, with long-term current use of insulin ([place]) 04/20/2016  . Osteomyelitis of toe of left foot ([place]) 03/15/2016  . AKI (acute kidney injury) ([place]) 06/23/2015  . Diarrhea 06/23/2015  . HLD (hyperlipidemia)   . Essential hypertension   . Heart palpitations 05/06/2015    Past Surgical History:  Procedure Laterality Date  . AMPUTATION Left 03/16/2016   Procedure: AMPUTATION DIGIT LEFT HALLUX;  Surgeon: Trula Slade, DPM;  Location: [place];  Service: Podiatry;  Laterality: Left;  can start around 5   . ARTHROSCOPIC REPAIR ACL Left   . COLONOSCOPY WITH PROPOFOL N/A 10/28/2015   Procedure: COLONOSCOPY WITH PROPOFOL;  Surgeon: Lollie Sails, MD;  Location: [facility] ENDOSCOPY;  Service: Endoscopy;  Laterality: N/A;  . COLONOSCOPY WITH PROPOFOL N/A 10/29/2015   Procedure: COLONOSCOPY WITH PROPOFOL;  Surgeon: Lollie Sails, MD;  Location: [facility] ENDOSCOPY;  Service: Endoscopy;  Laterality: N/A;  . PROSTATE SURGERY  2016  . TONSILECTOMY/ADENOIDECTOMY WITH MYRINGOTOMY    .  TONSILLECTOMY      Prior to Admission medications   Medication Sig Start Date End Date Taking? Authorizing Provider  buPROPion (WELLBUTRIN) 75 MG tablet Take 1 tablet (75 mg total) by mouth every morning. 07/05/17  Yes Ursula Alert, MD  calcitRIOL (ROCALTROL) 0.25 MCG capsule Take 1 capsule by mouth daily. 09/20/17  Yes [provider]  calcium citrate-vitamin D (CITRACAL+D) 315-200 MG-UNIT tablet Take 2 tablets by mouth daily.    Yes [provider]  carvedilol (COREG) 12.5 MG tablet Take 12.5 mg by mouth daily.  09/20/17  Yes [provider]  cholestyramine light (PREVALITE) 4 g packet Take 4 g  by mouth daily as needed (digestion).  11/17/15  Yes [provider]  citalopram (CELEXA) 20 MG tablet Take 20 mg by mouth daily.  06/16/17  Yes [provider]  diltiazem (CARDIZEM) 120 MG tablet Take 120 mg by mouth daily.    Yes [provider]  FEROSUL 325 (65 Fe) MG tablet Take 325 mg by mouth 2 (two) times daily with a meal.  09/07/17  Yes [provider]  fluticasone (FLONASE) 50 MCG/ACT nasal spray Place 1 spray into both nostrils daily as needed for allergies.  10/06/16  Yes [provider]  furosemide (LASIX) 80 MG tablet Take 1 tablet (80 mg total) by mouth 2 (two) times daily. 11/10/17  Yes Fritzi Mandes, MD  insulin aspart (NOVOLOG) 100 UNIT/ML injection Inject 0-6 Units into the skin 3 (three) times daily before meals. Per sliding scale   Yes [provider]  insulin glargine (LANTUS) 100 UNIT/ML injection Inject 0.15 mLs (15 Units total) into the skin at bedtime. 11/10/17  Yes Fritzi Mandes, MD  lisinopril (PRINIVIL,ZESTRIL) 20 MG tablet Take 20 mg by mouth 2 (two) times daily.  10/04/17  Yes [provider]  metoCLOPramide (REGLAN) 10 MG tablet Take 1 tablet (10 mg total) by mouth every 8 (eight) hours as needed for nausea. 11/07/17 11/07/18 Yes Nance Pear, MD  ondansetron (ZOFRAN-ODT) 4 MG disintegrating tablet Take 4 mg by mouth every 8 (eight) hours as needed for nausea or vomiting.  10/13/17  Yes [provider]  potassium chloride SA (K-DUR,KLOR-CON) 20 MEQ tablet Take 2 tablets (40 mEq total) by mouth daily. 11/10/17  Yes Fritzi Mandes, MD  rosuvastatin (CRESTOR) 40 MG tablet Take 40 mg by mouth daily.  05/26/17  Yes [provider]  sucralfate (CARAFATE) 1 g tablet Take 1 g by mouth 4 (four) times daily. 12/14/17  Yes [provider]  tamsulosin (FLOMAX) 0.4 MG CAPS capsule Take 0.4 mg by mouth daily after breakfast.    Yes [provider]  dicyclomine (BENTYL) 20 MG tablet Take 1 tablet (20 mg  total) by mouth 3 (three) times daily as needed (abdominal pain). Patient not taking: Reported on 12/17/2017 11/07/17   Nance Pear, MD    Allergies Levofloxacin; Phenergan [promethazine hcl]; Malt; and Soy allergy  Family History  Problem Relation Age of Onset  . CAD Father   . Stroke Father   . Diabetes Mellitus II Mother   . Kidney failure Mother   . Schizophrenia Mother     Social History Social History   Tobacco Use  . Smoking status: Never Smoker  . Smokeless tobacco: Never Used  Substance Use Topics  . Alcohol use: No  . Drug use: No    Review of Systems  Constitutional: No fever/chills Eyes: No visual changes. ENT: No sore throat. Cardiovascular: Denies chest pain. Respiratory: Denies shortness  of breath. Gastrointestinal: No abdominal pain.  No nausea, no vomiting.  No diarrhea.  No constipation. Genitourinary: Negative for dysuria. Musculoskeletal: Negative for back pain. Skin: Negative for rash. Neurological: Negative for headaches, focal weakness   ____________________________________________   PHYSICAL EXAM:  VITAL SIGNS: ED Triage Vitals  Enc Vitals Group     BP 12/16/17 2352 (!) 178/94     Pulse Rate 12/16/17 2352 87     Resp 12/16/17 2352 20     Temp 12/16/17 2352 98 F (36.7 C)     Temp Source 12/16/17 2352 Oral     SpO2 12/16/17 2352 98 %     Weight 12/16/17 2353 245 lb (111.1 kg)     Height 12/16/17 2353 6\' 3"  (1.905 m)     Head Circumference --      Peak Flow --      Pain Score 12/16/17 2353 5     Pain Loc --      Pain Edu? --      Excl. in [place]? --     Constitutional: Alert and oriented. Well appearing and in no acute distress! Eyes: Conjunctivae are normal.  Head: Atraumatic. Nose: No congestion/rhinnorhea. Mouth/Throat: Mucous membranes are moist.  Oropharynx non-erythematous. Neck: No stridor. Cardiovascular: Normal rate, regular rhythm. Grossly normal heart sounds.  Good peripheral circulation. Respiratory: Normal  respiratory effort.  No retractions. Lungs CTAB. Gastrointestinal: Soft and nontender. No distention. No abdominal bruits. No CVA tenderness. Musculoskeletal: No lower extremity tenderness but 2+ edema.  Neurologic:  Normal speech and language. No gross focal neurologic deficits are appreciated.  Skin:  Skin is warm, dry and intact. No rash noted. Psychiatric: Mood and affect are normal. Speech and behavior are normal.  ____________________________________________   LABS (all labs ordered are listed, but only abnormal results are displayed)  Labs Reviewed  BASIC METABOLIC PANEL - Abnormal; Notable for the following components:      Result Value   Glucose, Bld 434 (*)    BUN 41 (*)    Creatinine, Ser 2.29 (*)    Calcium 8.0 (*)    GFR calc non Af Amer 32 (*)    GFR calc Af Amer 37 (*)    All other components within normal limits  CBC - Abnormal; Notable for the following components:   Hemoglobin 9.7 (*)    HCT 31.4 (*)    MCV 73.4 (*)    MCH 22.7 (*)    All other components within normal limits  TROPONIN I  BRAIN NATRIURETIC PEPTIDE   ____________________________________________  EKG KG read interpreted by me shows normal sinus rhythm rate of 87 normal axis no acute ST-T changes  ____________________________________________  RADIOLOGY  ED MD interpretation: Chest x-ray read by radiology reviewed by me shows no active disease  Official radiology report(s): Dg Chest 2 View  Result Date: 12/17/2017 CLINICAL DATA:  47 year old male with CHF. EXAM: CHEST - 2 VIEW COMPARISON:  Chest radiograph dated 11/08/2017 FINDINGS: The heart size and mediastinal contours are within normal limits. Both lungs are clear. The visualized skeletal structures are unremarkable. IMPRESSION: No active cardiopulmonary disease. Electronically Signed   By: Anner Crete M.D.   On: 12/17/2017 00:32    ____________________________________________   PROCEDURES  Procedure(s) performed:    Procedures  Critical Care performed: Critical care time 1/2-hour this includes reviewing the patient's old records and reexamining the patient and visiting with him several times to see how the Lasix was progressing.  ____________________________________________   INITIAL IMPRESSION / ASSESSMENT  AND PLAN / ED COURSE  Patient feels better after putting out the urine.  I will give him 1 further dose of Lasix and let him go.  He will follow-up with his doctors return here if he is worse.         ____________________________________________   FINAL CLINICAL IMPRESSION(S) / ED DIAGNOSES  Final diagnoses:  Peripheral edema  Hypertension, unspecified type     ED Discharge Orders    None       Note:  This document was prepared using Dragon voice recognition software and may include unintentional dictation errors.    Nena Polio, MD 12/17/17 3329    Nena Polio, MD 12/31/17 2024

## 2017-12-17 NOTE — ED Notes (Addendum)
Pharmacy called for medication, stated it had been tubed 15 minutes prior. Unable to find medication. Pharmacy called to send another medication. Primary RN made aware

## 2017-12-17 NOTE — Discharge Instructions (Addendum)
Please follow-up with your regular doctor in the next 2 to 3 days.  Return here if your weight goes up further again or you have any further problems.  Also return here if your blood pressure stays elevated in the area of 180 or higher for the top number.

## 2017-12-17 NOTE — ED Notes (Signed)
Patient transported to X-ray 

## 2017-12-17 NOTE — ED Notes (Addendum)
Pt to the er for bilateral swelling to the lower extremities. Pt has 3 plus pitting edema the the feet, ankles, calves, and per pt up to his thighs. Pt is currently being seen by GI for vomiting and was going to have a scope done last week but was unable to have it done as pt took an alka seltzer so procedure was canceled. Pt states he is unable to keep any of his meds down. Pt does take lasix at home but is not effective due to his vomiting. Pt has been admitted for same in the past. BP is elevated. No difficulty breathing or distress at this time.

## 2017-12-27 ENCOUNTER — Encounter
Admission: RE | Disposition: A | Discharge status: Home / Self Care | Payer: Self-pay | Source: Ambulatory Visit | Attending: Internal Medicine

## 2017-12-27 ENCOUNTER — Ambulatory Visit: Payer: BC Managed Care – PPO | Admitting: Anesthesiology

## 2017-12-27 ENCOUNTER — Encounter: Payer: Self-pay | Admitting: Certified Registered Nurse Anesthetist

## 2017-12-27 ENCOUNTER — Ambulatory Visit
Admission: RE | Admit: 2017-12-27 | Discharge: 2017-12-27 | Disposition: A | Discharge status: Home / Self Care | Payer: BC Managed Care – PPO | Source: Ambulatory Visit | Attending: Internal Medicine | Admitting: Internal Medicine

## 2017-12-27 DIAGNOSIS — M25572 Pain in left ankle and joints of left foot: Secondary | ICD-10-CM | POA: Diagnosis not present

## 2017-12-27 DIAGNOSIS — G4700 Insomnia, unspecified: Secondary | ICD-10-CM | POA: Diagnosis not present

## 2017-12-27 DIAGNOSIS — M25571 Pain in right ankle and joints of right foot: Secondary | ICD-10-CM | POA: Diagnosis not present

## 2017-12-27 DIAGNOSIS — I130 Hypertensive heart and chronic kidney disease with heart failure and stage 1 through stage 4 chronic kidney disease, or unspecified chronic kidney disease: Secondary | ICD-10-CM | POA: Insufficient documentation

## 2017-12-27 DIAGNOSIS — K210 Gastro-esophageal reflux disease with esophagitis: Secondary | ICD-10-CM | POA: Diagnosis not present

## 2017-12-27 DIAGNOSIS — I509 Heart failure, unspecified: Secondary | ICD-10-CM | POA: Diagnosis not present

## 2017-12-27 DIAGNOSIS — K920 Hematemesis: Secondary | ICD-10-CM | POA: Diagnosis present

## 2017-12-27 DIAGNOSIS — E1140 Type 2 diabetes mellitus with diabetic neuropathy, unspecified: Secondary | ICD-10-CM | POA: Insufficient documentation

## 2017-12-27 DIAGNOSIS — N189 Chronic kidney disease, unspecified: Secondary | ICD-10-CM | POA: Insufficient documentation

## 2017-12-27 DIAGNOSIS — E785 Hyperlipidemia, unspecified: Secondary | ICD-10-CM | POA: Diagnosis not present

## 2017-12-27 DIAGNOSIS — K2970 Gastritis, unspecified, without bleeding: Secondary | ICD-10-CM | POA: Insufficient documentation

## 2017-12-27 DIAGNOSIS — G8929 Other chronic pain: Secondary | ICD-10-CM | POA: Insufficient documentation

## 2017-12-27 DIAGNOSIS — E1122 Type 2 diabetes mellitus with diabetic chronic kidney disease: Secondary | ICD-10-CM | POA: Diagnosis not present

## 2017-12-27 HISTORY — PX: ESOPHAGOGASTRODUODENOSCOPY (EGD) WITH PROPOFOL: SHX5813

## 2017-12-27 LAB — GLUCOSE, CAPILLARY: Glucose-Capillary: 234 mg/dL — ABNORMAL HIGH (ref 70–99)

## 2017-12-27 SURGERY — ESOPHAGOGASTRODUODENOSCOPY (EGD) WITH PROPOFOL
Anesthesia: General

## 2017-12-27 MED ORDER — PROPOFOL 10 MG/ML IV BOLUS
INTRAVENOUS | Status: DC | PRN
  Administered 2017-12-27: 100 mg via INTRAVENOUS

## 2017-12-27 MED ORDER — LIDOCAINE HCL (CARDIAC) PF 100 MG/5ML IV SOSY
PREFILLED_SYRINGE | INTRAVENOUS | Status: DC | PRN
  Administered 2017-12-27: 50 mg via INTRAVENOUS

## 2017-12-27 MED ORDER — MIDAZOLAM HCL 2 MG/2ML IJ SOLN
INTRAMUSCULAR | Status: AC
  Filled 2017-12-27: qty 2

## 2017-12-27 MED ORDER — PROPOFOL 500 MG/50ML IV EMUL
INTRAVENOUS | Status: DC | PRN
  Administered 2017-12-27: 140 ug/kg/min via INTRAVENOUS

## 2017-12-27 MED ORDER — SODIUM CHLORIDE 0.9 % IV SOLN
INTRAVENOUS | Status: DC
  Administered 2017-12-27: 1000 mL via INTRAVENOUS

## 2017-12-27 MED ORDER — PROPOFOL 10 MG/ML IV BOLUS
INTRAVENOUS | Status: AC
  Filled 2017-12-27: qty 20

## 2017-12-27 MED ORDER — MIDAZOLAM HCL 2 MG/2ML IJ SOLN
INTRAMUSCULAR | Status: DC | PRN
  Administered 2017-12-27: 2 mg via INTRAVENOUS

## 2017-12-27 MED ORDER — LIDOCAINE HCL (PF) 2 % IJ SOLN
INTRAMUSCULAR | Status: AC
  Filled 2017-12-27: qty 10

## 2017-12-27 NOTE — Transfer of Care (Signed)
Immediate Anesthesia Transfer of Care Note  Patient: [name].  Procedure(s) Performed: ESOPHAGOGASTRODUODENOSCOPY (EGD) WITH PROPOFOL (N/A )  Patient Location: PACU and Endoscopy Unit  Anesthesia Type:General  Level of Consciousness: drowsy  Airway & Oxygen Therapy: Patient Spontanous Breathing and Patient connected to nasal cannula oxygen  Post-op Assessment: Report given to RN and Post -op Vital signs reviewed and stable  Post vital signs: Reviewed and stable  Last Vitals:  Vitals Value Taken Time  BP 131/93 12/27/2017  8:23 AM  Temp 36.1 C 12/27/2017  8:22 AM  Pulse 77 12/27/2017  8:23 AM  Resp 12 12/27/2017  8:23 AM  SpO2 99 % 12/27/2017  8:23 AM  Vitals shown include unvalidated device data.  Last Pain:  Vitals:   12/27/17 0822  TempSrc: Oral  PainSc: Asleep         Complications: No apparent anesthesia complications

## 2017-12-27 NOTE — Anesthesia Postprocedure Evaluation (Signed)
Anesthesia Post Note  Patient: [name].  Procedure(s) Performed: ESOPHAGOGASTRODUODENOSCOPY (EGD) WITH PROPOFOL (N/A )  Patient location during evaluation: PACU Anesthesia Type: General Level of consciousness: awake and alert Pain management: pain level controlled Vital Signs Assessment: post-procedure vital signs reviewed and stable Respiratory status: spontaneous breathing, nonlabored ventilation and respiratory function stable Cardiovascular status: blood pressure returned to baseline and stable Postop Assessment: no apparent nausea or vomiting Anesthetic complications: no     Last Vitals:  Vitals:   12/27/17 0842 12/27/17 0852  BP: (!) 136/94 (!) 153/101  Pulse: 78 80  Resp: 12 12  Temp:    SpO2: 100% 98%    Last Pain:  Vitals:   12/27/17 0852  TempSrc:   PainSc: 0-No pain                 Durenda Hurt

## 2017-12-27 NOTE — Anesthesia Post-op Follow-up Note (Signed)
Anesthesia QCDR form completed.        

## 2017-12-27 NOTE — Interval H&P Note (Signed)
History and Physical Interval Note:  12/27/2017 8:08 AM  [name].  has presented today for surgery, with the diagnosis of N&V  The various methods of treatment have been discussed with the patient and family. After consideration of risks, benefits and other options for treatment, the patient has consented to  Procedure(s): ESOPHAGOGASTRODUODENOSCOPY (EGD) WITH PROPOFOL (N/A) as a surgical intervention .  The patient's history has been reviewed, patient examined, no change in status, stable for surgery.  I have reviewed the patient's chart and labs.  Questions were answered to the patient's satisfaction.  Patient NOT taking aspirin or NSAIDs, therefore we will proceed.    [place], [place]

## 2017-12-27 NOTE — Anesthesia Preprocedure Evaluation (Addendum)
Anesthesia Evaluation  Patient identified by MRN, date of birth, ID band Patient awake    Reviewed: Allergy & Precautions, H&P , NPO status , Patient's Chart, lab work & pertinent test results  History of Anesthesia Complications Negative for: history of anesthetic complications  Airway Mallampati: III       Dental  (+) Chipped, Missing, Poor Dentition   Pulmonary neg pulmonary ROS,    breath sounds clear to auscultation       Cardiovascular hypertension, +CHF   Rhythm:regular Rate:Normal  - Normal LVEF and wall motion with mild diastolic dysfunction and   severe concentric LVH.  HTN currently uncontrolled as he is unable to keep his antihypertensive medications down due to frequent vomiting.  Denies CP, H/A   Neuro/Psych Anxiety negative neurological ROS  negative psych ROS   GI/Hepatic negative GI ROS, Neg liver ROS,   Endo/Other  diabetes  Renal/GU CRFRenal disease  negative genitourinary   Musculoskeletal   Abdominal   Peds  Hematology negative hematology ROS (+)   Anesthesia Other Findings Past Medical History: No date: Anxiety No date: Cellulitis No date: CHF (congestive heart failure) (HCC) No date: Chronic diarrhea No date: Chronic kidney disease No date: Chronic pain of both ankles No date: Diabetes mellitus without complication (H[facility]) No date: Diabetes mellitus, type II (H[facility]) No date: Diabetic neuropathy (H[facility]) No date: Frequent falls No date: Gait instability No date: Heart palpitations No date: HLD (hyperlipidemia) No date: Hypertension No date: Insomnia No date: Nausea and vomiting in adult     Comment:  recurrent  Past Surgical History: 03/16/2016: AMPUTATION; Left     Comment:  Procedure: AMPUTATION DIGIT LEFT HALLUX;  Surgeon:               Trula Slade, DPM;  Location: [place];  Service:               Podiatry;  Laterality: Left;  can start around 5  No date: ARTHROSCOPIC REPAIR  ACL; Left 10/28/2015: COLONOSCOPY WITH PROPOFOL; N/A     Comment:  Procedure: COLONOSCOPY WITH PROPOFOL;  Surgeon: Lollie Sails, MD;  Location: [facility] ENDOSCOPY;  Service:               Endoscopy;  Laterality: N/A; 10/29/2015: COLONOSCOPY WITH PROPOFOL; N/A     Comment:  Procedure: COLONOSCOPY WITH PROPOFOL;  Surgeon: Lollie Sails, MD;  Location: [facility] ENDOSCOPY;  Service:               Endoscopy;  Laterality: N/A; 2016: PROSTATE SURGERY No date: TONSILECTOMY/ADENOIDECTOMY WITH MYRINGOTOMY No date: TONSILLECTOMY  BMI    Body Mass Index:  28.62 kg/m      Reproductive/Obstetrics negative OB ROS                            Anesthesia Physical Anesthesia Plan  ASA: III  Anesthesia Plan: General   Post-op Pain Management:    Induction:   PONV Risk Score and Plan: Propofol infusion and TIVA  Airway Management Planned: Natural Airway and Nasal Cannula  Additional Equipment:   Intra-op Plan:   Post-operative Plan:   Informed Consent: I have reviewed the patients History and Physical, chart, labs and discussed the procedure including the risks, benefits and alternatives for the proposed anesthesia with the patient or authorized  representative who has indicated his/her understanding and acceptance.   Dental Advisory Given  Plan Discussed with: Anesthesiologist, CRNA and Surgeon  Anesthesia Plan Comments:        Anesthesia Quick Evaluation

## 2017-12-27 NOTE — Op Note (Signed)
[facility] Gastroenterology Patient Name: [name] Procedure Date: 12/27/2017 7:52 AM MRN: [id number] Account #: [id number] Date of Birth: 09/15/1970 Admit Type: Outpatient Age: 47 Room: [facility] ENDO ROOM 2 Gender: Male Note Status: Finalized Procedure:            Upper GI endoscopy Indications:          Epigastric abdominal pain, Hematemesis, Nausea with                        vomiting Providers:            Benay Pike. Alice Reichert MD, MD Referring MD:         [name] (Referring MD) Medicines:            Propofol per Anesthesia Complications:        No immediate complications. Procedure:            Pre-Anesthesia Assessment:                       - The risks and benefits of the procedure and the                        sedation options and risks were discussed with the                        patient. All questions were answered and informed                        consent was obtained.                       - Patient identification and proposed procedure were                        verified prior to the procedure by the nurse. The                        procedure was verified in the procedure room.                       - ASA Grade Assessment: II - A patient with mild                        systemic disease.                       - After reviewing the risks and benefits, the patient                        was deemed in satisfactory condition to undergo the                        procedure.                       After obtaining informed consent, the endoscope was                        passed under direct vision. Throughout the procedure,                        the  patient's blood pressure, pulse, and oxygen                        saturations were monitored continuously. The Endoscope                        was introduced through the mouth, and advanced to the                        third part of duodenum. The upper GI endoscopy was   accomplished without difficulty. The patient tolerated                        the procedure well. Findings:      LA Grade A (one or more mucosal breaks less than 5 mm, not extending       between tops of 2 mucosal folds) esophagitis with no bleeding was found       at the gastroesophageal junction.      Localized minimal inflammation characterized by erythema was found in       the gastric antrum. Biopsies were taken with a cold forceps for       Helicobacter pylori testing.      A medium amount of food (residue) was found in the gastric fundus.      The examined duodenum was normal.      The exam was otherwise without abnormality. Impression:           - LA Grade A reflux esophagitis.                       - Gastritis. Biopsied.                       - A medium amount of food (residue) in the stomach.                       - Normal examined duodenum.                       - The examination was otherwise normal. Recommendation:       - Patient has a contact number available for                        emergencies. The signs and symptoms of potential                        delayed complications were discussed with the patient.                        Return to normal activities tomorrow. Written discharge                        instructions were provided to the patient.                       - Resume previous diet.                       - Continue present medications.                       - No aspirin, ibuprofen, naproxen,  or other                        non-steroidal anti-inflammatory drugs.                       - Return to physician assistant in 2 months.                       - The findings and recommendations were discussed with                        the patient and their family. Procedure Code(s):    --- Professional ---                       [phone], Esophagogastroduodenoscopy, flexible, transoral;                        with biopsy, single or multiple Diagnosis Code(s):    ---  Professional ---                       R11.2, Nausea with vomiting, unspecified                       K92.0, Hematemesis                       R10.13, Epigastric pain                       K29.70, Gastritis, unspecified, without bleeding                       K21.0, Gastro-esophageal reflux disease with esophagitis CPT copyright 2018 [employer] Medical [employer]. All rights reserved. The codes documented in this report are preliminary and upon coder review may  be revised to meet current compliance requirements. Efrain Sella MD, MD 12/27/2017 8:21:19 AM This report has been signed electronically. Number of Addenda: 0 Note Initiated On: 12/27/2017 7:52 AM      [facility]

## 2017-12-28 LAB — SURGICAL PATHOLOGY

## 2017-12-29 ENCOUNTER — Encounter: Payer: Self-pay | Admitting: Internal Medicine

## 2018-01-06 NOTE — Progress Notes (Signed)
Patient ID: [name] Cooper., male    DOB: 01/07/1971, 47 y.o.   MRN: [id number]  HPI  [name] is a 47 y/o male with a history of DM, hyperlipidemia, HTN, CKD, anxiety and chronic heart failure.   Echo report from 06/29/17 reviewed and showed an EF of 75% along with mild [name].   Was in the ED 12/17/17 due to peripheral edema. IV lasix was given twice and he was released. Admitted 11/08/17 due to acute HF exacerbation with a 20 pound weight gain over previous 2 weeks. Initially needed IV lasix and then transitioned to oral diuretics. Nephrology consult obtained. Discharged after 2 days. Was in the ED 11/07/17 due to abdominal pain where he was treated and released.   He presents today for a follow-up visit with a chief complaint of moderate fatigue upon minimal exertion. He describes this as chronic in nature having been present for several months. He has associated pedal edema, palpitations, abdominal pain, nausea, vomiting, light-headedness and difficulty sleeping along with this. He denies any abdominal distention, chest pain, shortness of breath, cough or weight gain. Continues to have multiple daily episodes of vomiting and is still unable to keep his medications down.   Past Medical History:  Diagnosis Date  . Anxiety   . Cellulitis   . CHF (congestive heart failure) ([place])   . Chronic diarrhea   . Chronic kidney disease   . Chronic pain of both ankles   . Diabetes mellitus without complication ([place])   . Diabetes mellitus, type II ([place])   . Diabetic neuropathy ([place])   . Frequent falls   . Gait instability   . Heart palpitations   . HLD (hyperlipidemia)   . Hypertension   . Insomnia   . Nausea and vomiting in adult    recurrent   Past Surgical History:  Procedure Laterality Date  . AMPUTATION Left 03/16/2016   Procedure: AMPUTATION DIGIT LEFT HALLUX;  Surgeon: Trula Slade, DPM;  Location: [place];  Service: Podiatry;  Laterality: Left;  can start around 5   . ARTHROSCOPIC  REPAIR ACL Left   . COLONOSCOPY WITH PROPOFOL N/A 10/28/2015   Procedure: COLONOSCOPY WITH PROPOFOL;  Surgeon: Lollie Sails, MD;  Location: [facility] ENDOSCOPY;  Service: Endoscopy;  Laterality: N/A;  . COLONOSCOPY WITH PROPOFOL N/A 10/29/2015   Procedure: COLONOSCOPY WITH PROPOFOL;  Surgeon: Lollie Sails, MD;  Location: [facility] ENDOSCOPY;  Service: Endoscopy;  Laterality: N/A;  . ESOPHAGOGASTRODUODENOSCOPY (EGD) WITH PROPOFOL N/A 12/27/2017   Procedure: ESOPHAGOGASTRODUODENOSCOPY (EGD) WITH PROPOFOL;  Surgeon: Toledo, Benay Pike, MD;  Location: [name] ENDOSCOPY;  Service: Gastroenterology;  Laterality: N/A;  . PROSTATE SURGERY  2016  . TONSILECTOMY/ADENOIDECTOMY WITH MYRINGOTOMY    . TONSILLECTOMY     Family History  Problem Relation Age of Onset  . CAD Father   . Stroke Father   . Diabetes Mellitus II Mother   . Kidney failure Mother   . Schizophrenia Mother    Social History   Tobacco Use  . Smoking status: Never Smoker  . Smokeless tobacco: Never Used  Substance Use Topics  . Alcohol use: No   Allergies  Allergen Reactions  . Levofloxacin Swelling  . Phenergan [Promethazine Hcl] Other (See Comments)    Cramping   . Malt     Other reaction(s): OTHER  . Soy Allergy Other (See Comments)    Other reaction(s): OTHER   Prior to Admission medications   Medication Sig Start Date End Date Taking? Authorizing Provider  buPROPion (WELLBUTRIN) 75 MG tablet Take 1 tablet (75 mg total) by mouth every morning. 07/05/17  Yes Ursula Alert, MD  calcitRIOL (ROCALTROL) 0.25 MCG capsule Take 1 capsule by mouth daily. 09/20/17  Yes [provider]  calcium citrate-vitamin D (CITRACAL+D) 315-200 MG-UNIT tablet Take 2 tablets by mouth daily.    Yes [provider]  carvedilol (COREG) 12.5 MG tablet Take 12.5 mg by mouth daily.  09/20/17  Yes [provider]  cholestyramine light (PREVALITE) 4 g packet Take 4 g by mouth daily as needed (digestion).  11/17/15  Yes [provider]  citalopram (CELEXA) 20 MG tablet Take 20 mg by mouth daily.  06/16/17  Yes [provider]  dicyclomine (BENTYL) 20 MG tablet Take 1 tablet (20 mg total) by mouth 3 (three) times daily as needed (abdominal pain). 11/07/17  Yes Nance Pear, MD  diltiazem (CARDIZEM) 120 MG tablet Take 120 mg by mouth daily.    Yes [provider]  FEROSUL 325 (65 Fe) MG tablet Take 325 mg by mouth 2 (two) times daily with a meal.  09/07/17  Yes [provider]  insulin aspart (NOVOLOG) 100 UNIT/ML injection Inject 0-6 Units into the skin 3 (three) times daily before meals. Per sliding scale   Yes [provider]  insulin glargine (LANTUS) 100 UNIT/ML injection Inject 0.15 mLs (15 Units total) into the skin at bedtime. 11/10/17  Yes Fritzi Mandes, MD  lisinopril (PRINIVIL,ZESTRIL) 20 MG tablet Take 20 mg by mouth 2 (two) times daily.  10/04/17  Yes [provider]  metoCLOPramide (REGLAN) 10 MG tablet Take 1 tablet (10 mg total) by mouth every 8 (eight) hours as needed for nausea. 11/07/17 11/07/18 Yes Nance Pear, MD  omeprazole (PRILOSEC) 40 MG capsule Take 40 mg by mouth 3 (three) times daily before meals. 12/27/17  Yes [provider]  ondansetron (ZOFRAN-ODT) 4 MG disintegrating tablet Take 4 mg by mouth every 8 (eight) hours as needed for nausea or vomiting.  10/13/17  Yes [provider]  potassium chloride SA (K-DUR,KLOR-CON) 20 MEQ tablet Take 2 tablets (40 mEq total) by mouth daily. 11/10/17  Yes Fritzi Mandes, MD  rosuvastatin (CRESTOR) 40 MG tablet Take 40 mg by mouth daily.  05/26/17  Yes [provider]  sucralfate (CARAFATE) 1 g tablet Take 1 g by mouth 4 (four) times daily. 12/14/17  Yes [provider]  tamsulosin (FLOMAX) 0.4 MG CAPS capsule Take 0.4 mg by mouth daily after breakfast.    Yes [provider]  torsemide (DEMADEX) 100 MG tablet Take 100 mg by mouth 2 (two) times daily. 12/27/17   [provider]    Review of Systems  Constitutional: Positive for appetite change (decreased) and fatigue.  HENT: Negative for congestion, postnasal drip and sore throat.   Eyes: Negative.   Respiratory: Negative for cough, chest tightness and shortness of breath.   Cardiovascular: Positive for palpitations and leg swelling. Negative for chest pain.  Gastrointestinal: Positive for abdominal pain, diarrhea, nausea and vomiting (3-4 times/ daily after eating). Negative for abdominal distention.  Endocrine: Negative.   Genitourinary: Negative.   Musculoskeletal: Negative for back pain and neck pain.  Skin: Negative.   Allergic/Immunologic: Negative.   Neurological: Positive for light-headedness. Negative for dizziness.  Hematological: Negative for adenopathy. Does not bruise/bleed easily.  Psychiatric/Behavioral: Positive for sleep disturbance. Negative for dysphoric mood. The patient is not nervous/anxious.    Vitals:   01/09/18 1055  BP: (!) 165/101  Pulse:  81  Resp: 18  SpO2: 99%  Weight: 236 lb 2 oz (107.1 kg)  Height: 6\' 3"  (1.905 m)   Wt Readings from Last 3 Encounters:  01/09/18 236 lb 2 oz (107.1 kg)  12/27/17 229 lb (103.9 kg)  12/16/17 245 lb (111.1 kg)   Lab Results  Component Value Date   CREATININE 2.29 (H) 12/17/2017   CREATININE 3.01 (H) 11/28/2017   CREATININE 2.34 (H) 11/23/2017    Physical Exam  Constitutional: He is oriented to person, place, and time. He appears well-developed and well-nourished.  HENT:  Head: Normocephalic and atraumatic.  Neck: Normal range of motion. Neck supple. No JVD present.  Cardiovascular: Normal rate and regular rhythm.  Pulmonary/Chest: Effort normal. He has no wheezes. He has no rales.  Abdominal: Soft. He exhibits no distension. There is tenderness.  Musculoskeletal: He exhibits edema (3+ pitting bilateral lower legs). He exhibits no tenderness.  Neurological: He is alert and oriented to person, place, and time.  Skin:  Skin is warm and dry.  Psychiatric: He has a normal mood and affect. His behavior is normal. Thought content normal.  Nursing note and vitals reviewed.  Assessment & Plan:  1: Acute on Chronic heart failure with preserved ejection fraction- - NYHA class III - remains moderately fluid overloaded  - weighing daily and he was reminded to call for an overnight weight gain of >2 pounds or a weekly weight gain of >5 pounds - weight unchanged from last visit 1 month ago - not adding salt to his food and occasionally reads food labels. Reviewed the importance of closely following a 2000mg  sodium diet  - will send for 80mg  IV lasix/ 47meq PO potassium today - BMP/BNP to be drawn today - BNP 12/17/17 was 32.0 - PharmD reconciled medications with the patient  2: HTN- - BP remains elevated but he continues with multiple episodes of vomiting so unable to keep medications down - sees PCP [name] Darter) & returns in a couple of weeks - BMP 12/17/17 reviewed and showed sodium 138, potassium 4.1, creatinine 2.29 and GFR 37  3: DM- - says that his glucose this morning was 280 - saw nephrology [facility]) couple of weeks ago - sees endocrinologist next week - A1c 11/09/17 was 15.2%  4: Vomiting- - says that he has this 3-4 times/ day along with vomiting after eating/ drinking liquids - wonder if he has diabetic gastroparesis - he's concerned that his body isn't absorbing his medications due to the vomiting - saw GI Ellin Mayhew) 11/03/17 - had gastric emptying study 10/12 and UGI/SBFT on 12/06/17 - had EGD done 12/27/17  5: Lymphedema- - stage 2 - limited in his ability to exercise due to multiple episode of vomiting - not much improvement with compression socks - feels like his edema is better first thing in the morning and then worsens as the day progresses - will make referral for lymphapress compression boots  Patient did not bring his medications nor a list. Each medication was verbally reviewed with  the patient and he was encouraged to bring the bottles to every visit to confirm accuracy of list.  Return in 3 days or sooner for any questions/problems before then.   **received phone call from same day surgery with a critical glucose of 530. Advised nurse to have patient check his glucose at home a couple more times today and adjust his insulin accordingly**

## 2018-01-09 ENCOUNTER — Ambulatory Visit
Admission: RE | Admit: 2018-01-09 | Discharge: 2018-01-09 | Disposition: A | Discharge status: Home / Self Care | Payer: BC Managed Care – PPO | Source: Ambulatory Visit | Attending: Family | Admitting: Family

## 2018-01-09 ENCOUNTER — Ambulatory Visit: Payer: BC Managed Care – PPO | Admitting: Family

## 2018-01-09 ENCOUNTER — Encounter: Payer: Self-pay | Admitting: Family

## 2018-01-09 ENCOUNTER — Other Ambulatory Visit: Payer: Self-pay | Admitting: Family

## 2018-01-09 VITALS — BP 165/101 | HR 81 | Resp 18 | Ht 75.0 in | Wt 236.1 lb

## 2018-01-09 DIAGNOSIS — F419 Anxiety disorder, unspecified: Secondary | ICD-10-CM | POA: Diagnosis not present

## 2018-01-09 DIAGNOSIS — I130 Hypertensive heart and chronic kidney disease with heart failure and stage 1 through stage 4 chronic kidney disease, or unspecified chronic kidney disease: Secondary | ICD-10-CM | POA: Insufficient documentation

## 2018-01-09 DIAGNOSIS — E785 Hyperlipidemia, unspecified: Secondary | ICD-10-CM | POA: Diagnosis not present

## 2018-01-09 DIAGNOSIS — G8929 Other chronic pain: Secondary | ICD-10-CM | POA: Insufficient documentation

## 2018-01-09 DIAGNOSIS — Z79899 Other long term (current) drug therapy: Secondary | ICD-10-CM | POA: Diagnosis not present

## 2018-01-09 DIAGNOSIS — M25572 Pain in left ankle and joints of left foot: Secondary | ICD-10-CM | POA: Diagnosis not present

## 2018-01-09 DIAGNOSIS — Z794 Long term (current) use of insulin: Secondary | ICD-10-CM | POA: Diagnosis not present

## 2018-01-09 DIAGNOSIS — Z91018 Allergy to other foods: Secondary | ICD-10-CM | POA: Diagnosis not present

## 2018-01-09 DIAGNOSIS — R112 Nausea with vomiting, unspecified: Secondary | ICD-10-CM | POA: Insufficient documentation

## 2018-01-09 DIAGNOSIS — G4700 Insomnia, unspecified: Secondary | ICD-10-CM | POA: Diagnosis not present

## 2018-01-09 DIAGNOSIS — E1122 Type 2 diabetes mellitus with diabetic chronic kidney disease: Secondary | ICD-10-CM | POA: Diagnosis not present

## 2018-01-09 DIAGNOSIS — Z833 Family history of diabetes mellitus: Secondary | ICD-10-CM | POA: Diagnosis not present

## 2018-01-09 DIAGNOSIS — M25571 Pain in right ankle and joints of right foot: Secondary | ICD-10-CM | POA: Insufficient documentation

## 2018-01-09 DIAGNOSIS — Z888 Allergy status to other drugs, medicaments and biological substances status: Secondary | ICD-10-CM | POA: Insufficient documentation

## 2018-01-09 DIAGNOSIS — I5033 Acute on chronic diastolic (congestive) heart failure: Secondary | ICD-10-CM

## 2018-01-09 DIAGNOSIS — E1165 Type 2 diabetes mellitus with hyperglycemia: Secondary | ICD-10-CM

## 2018-01-09 DIAGNOSIS — I10 Essential (primary) hypertension: Secondary | ICD-10-CM

## 2018-01-09 DIAGNOSIS — I890 Lymphedema, not elsewhere classified: Secondary | ICD-10-CM | POA: Insufficient documentation

## 2018-01-09 DIAGNOSIS — N189 Chronic kidney disease, unspecified: Secondary | ICD-10-CM | POA: Insufficient documentation

## 2018-01-09 DIAGNOSIS — Z9889 Other specified postprocedural states: Secondary | ICD-10-CM | POA: Diagnosis not present

## 2018-01-09 DIAGNOSIS — E1140 Type 2 diabetes mellitus with diabetic neuropathy, unspecified: Secondary | ICD-10-CM | POA: Insufficient documentation

## 2018-01-09 DIAGNOSIS — Z8249 Family history of ischemic heart disease and other diseases of the circulatory system: Secondary | ICD-10-CM | POA: Insufficient documentation

## 2018-01-09 DIAGNOSIS — Z818 Family history of other mental and behavioral disorders: Secondary | ICD-10-CM | POA: Diagnosis not present

## 2018-01-09 LAB — BASIC METABOLIC PANEL
ANION GAP: 6 (ref 5–15)
BUN: 49 mg/dL — ABNORMAL HIGH (ref 6–20)
CALCIUM: 8.5 mg/dL — AB (ref 8.9–10.3)
CO2: 31 mmol/L (ref 22–32)
CREATININE: 3.06 mg/dL — AB (ref 0.61–1.24)
Chloride: 98 mmol/L (ref 98–111)
GFR, EST AFRICAN AMERICAN: 26 mL/min — AB (ref >60)
GFR, EST NON AFRICAN AMERICAN: 22 mL/min — AB (ref >60)
GLUCOSE: 530 mg/dL — AB (ref 70–99)
Potassium: 4.5 mmol/L (ref 3.5–5.1)
Sodium: 135 mmol/L (ref 135–145)

## 2018-01-09 LAB — BRAIN NATRIURETIC PEPTIDE: B Natriuretic Peptide: 32 pg/mL (ref 0.0–100.0)

## 2018-01-09 MED ORDER — FUROSEMIDE 10 MG/ML IJ SOLN
INTRAMUSCULAR | Status: AC
  Administered 2018-01-09: 80 mg via INTRAVENOUS
  Filled 2018-01-09: qty 8

## 2018-01-09 MED ORDER — FUROSEMIDE 10 MG/ML IJ SOLN
80.0000 mg | Freq: Once | INTRAMUSCULAR | Status: AC
  Administered 2018-01-09: 80 mg via INTRAVENOUS

## 2018-01-09 MED ORDER — SODIUM CHLORIDE FLUSH 0.9 % IV SOLN
INTRAVENOUS | Status: AC
  Filled 2018-01-09: qty 20

## 2018-01-09 MED ORDER — POTASSIUM CHLORIDE CRYS ER 20 MEQ PO TBCR
EXTENDED_RELEASE_TABLET | ORAL | Status: AC
  Administered 2018-01-09: 40 meq via ORAL
  Filled 2018-01-09: qty 2

## 2018-01-09 MED ORDER — POTASSIUM CHLORIDE CRYS ER 20 MEQ PO TBCR
40.0000 meq | EXTENDED_RELEASE_TABLET | Freq: Once | ORAL | Status: AC
  Administered 2018-01-09: 40 meq via ORAL

## 2018-01-09 NOTE — Discharge Instructions (Signed)
Per Otila Kluver at the heart failure clinic please check your blood sugar several more times today due to it being elevated. If it stays elevated please call your endocrinologist and let them know and try to get in sooner to be seen.

## 2018-01-09 NOTE — Progress Notes (Addendum)
Per Otila Kluver at the heart failure clinic who was notified of a critical blood sugar of 530, patient needs to check his blood sugar several more times today and if continued to be high call his endocrinologist and move his appointment up. Per Otila Kluver patient can be discharged

## 2018-01-09 NOTE — Patient Instructions (Signed)
Continue weighing daily and call for an overnight weight gain of > 2 pounds or a weekly weight gain of >5 pounds. 

## 2018-01-12 ENCOUNTER — Encounter: Payer: Self-pay | Admitting: Family

## 2018-01-12 ENCOUNTER — Ambulatory Visit: Payer: BC Managed Care – PPO | Attending: Family | Admitting: Family

## 2018-01-12 VITALS — BP 143/79 | HR 82 | Resp 18 | Ht 75.0 in | Wt 233.2 lb

## 2018-01-12 DIAGNOSIS — Z79899 Other long term (current) drug therapy: Secondary | ICD-10-CM | POA: Diagnosis not present

## 2018-01-12 DIAGNOSIS — Z8249 Family history of ischemic heart disease and other diseases of the circulatory system: Secondary | ICD-10-CM | POA: Insufficient documentation

## 2018-01-12 DIAGNOSIS — E785 Hyperlipidemia, unspecified: Secondary | ICD-10-CM | POA: Diagnosis not present

## 2018-01-12 DIAGNOSIS — Z823 Family history of stroke: Secondary | ICD-10-CM | POA: Insufficient documentation

## 2018-01-12 DIAGNOSIS — I5032 Chronic diastolic (congestive) heart failure: Secondary | ICD-10-CM | POA: Diagnosis not present

## 2018-01-12 DIAGNOSIS — F419 Anxiety disorder, unspecified: Secondary | ICD-10-CM | POA: Insufficient documentation

## 2018-01-12 DIAGNOSIS — Z833 Family history of diabetes mellitus: Secondary | ICD-10-CM | POA: Insufficient documentation

## 2018-01-12 DIAGNOSIS — I130 Hypertensive heart and chronic kidney disease with heart failure and stage 1 through stage 4 chronic kidney disease, or unspecified chronic kidney disease: Secondary | ICD-10-CM | POA: Insufficient documentation

## 2018-01-12 DIAGNOSIS — Z881 Allergy status to other antibiotic agents status: Secondary | ICD-10-CM | POA: Insufficient documentation

## 2018-01-12 DIAGNOSIS — Z794 Long term (current) use of insulin: Secondary | ICD-10-CM | POA: Insufficient documentation

## 2018-01-12 DIAGNOSIS — Z818 Family history of other mental and behavioral disorders: Secondary | ICD-10-CM | POA: Insufficient documentation

## 2018-01-12 DIAGNOSIS — N189 Chronic kidney disease, unspecified: Secondary | ICD-10-CM | POA: Diagnosis not present

## 2018-01-12 DIAGNOSIS — Z888 Allergy status to other drugs, medicaments and biological substances status: Secondary | ICD-10-CM | POA: Diagnosis not present

## 2018-01-12 DIAGNOSIS — R109 Unspecified abdominal pain: Secondary | ICD-10-CM | POA: Diagnosis not present

## 2018-01-12 DIAGNOSIS — G4700 Insomnia, unspecified: Secondary | ICD-10-CM | POA: Insufficient documentation

## 2018-01-12 DIAGNOSIS — I509 Heart failure, unspecified: Secondary | ICD-10-CM | POA: Diagnosis present

## 2018-01-12 DIAGNOSIS — E1122 Type 2 diabetes mellitus with diabetic chronic kidney disease: Secondary | ICD-10-CM | POA: Insufficient documentation

## 2018-01-12 DIAGNOSIS — R112 Nausea with vomiting, unspecified: Secondary | ICD-10-CM | POA: Insufficient documentation

## 2018-01-12 DIAGNOSIS — E1140 Type 2 diabetes mellitus with diabetic neuropathy, unspecified: Secondary | ICD-10-CM | POA: Insufficient documentation

## 2018-01-12 DIAGNOSIS — I10 Essential (primary) hypertension: Secondary | ICD-10-CM

## 2018-01-12 DIAGNOSIS — I890 Lymphedema, not elsewhere classified: Secondary | ICD-10-CM

## 2018-01-12 DIAGNOSIS — R197 Diarrhea, unspecified: Secondary | ICD-10-CM | POA: Diagnosis not present

## 2018-01-12 DIAGNOSIS — E1165 Type 2 diabetes mellitus with hyperglycemia: Secondary | ICD-10-CM

## 2018-01-12 DIAGNOSIS — Z841 Family history of disorders of kidney and ureter: Secondary | ICD-10-CM | POA: Diagnosis not present

## 2018-01-12 LAB — BASIC METABOLIC PANEL
ANION GAP: 8 (ref 5–15)
BUN: 53 mg/dL — ABNORMAL HIGH (ref 6–20)
CO2: 28 mmol/L (ref 22–32)
Calcium: 8.1 mg/dL — ABNORMAL LOW (ref 8.9–10.3)
Chloride: 100 mmol/L (ref 98–111)
Creatinine, Ser: 3.13 mg/dL — ABNORMAL HIGH (ref 0.61–1.24)
GFR calc Af Amer: 25 mL/min — ABNORMAL LOW (ref >60)
GFR, EST NON AFRICAN AMERICAN: 22 mL/min — AB (ref >60)
Glucose, Bld: 350 mg/dL — ABNORMAL HIGH (ref 70–99)
POTASSIUM: 3.8 mmol/L (ref 3.5–5.1)
SODIUM: 136 mmol/L (ref 135–145)

## 2018-01-12 NOTE — Progress Notes (Signed)
Patient ID: [name] Cooper., male    DOB: 07/23/1970, 47 y.o.   MRN: [id number]  HPI  [name] is a 47 y/o male with a history of DM, hyperlipidemia, HTN, CKD, anxiety and chronic heart failure.   Echo report from 06/29/17 reviewed and showed an EF of 75% along with mild [name].   Was in the ED 12/17/17 due to peripheral edema. IV lasix was given twice and he was released. Admitted 11/08/17 due to acute HF exacerbation with a 20 pound weight gain over previous 2 weeks. Initially needed IV lasix and then transitioned to oral diuretics. Nephrology consult obtained. Discharged after 2 days. Was in the ED 11/07/17 due to abdominal pain where he was treated and released.   He presents today for a follow-up visit with a chief complaint of moderate fatigue upon minimal exertion. He describes this as chronic in nature having been present for several years. He has associated shortness of breath, decreased appetite, pedal edema, abdominal pain, diarrhea, vomiting, difficulty sleeping and light-headedness along with this. He denies any palpitations, chest pain, cough or weight gain. Continues to have great difficulty in keeping down his medications due to n/v/d.   Past Medical History:  Diagnosis Date  . Anxiety   . Cellulitis   . CHF (congestive heart failure) ([place])   . Chronic diarrhea   . Chronic kidney disease   . Chronic pain of both ankles   . Diabetes mellitus without complication ([place])   . Diabetes mellitus, type II ([place])   . Diabetic neuropathy ([place])   . Frequent falls   . Gait instability   . Heart palpitations   . HLD (hyperlipidemia)   . Hypertension   . Insomnia   . Nausea and vomiting in adult    recurrent   Past Surgical History:  Procedure Laterality Date  . AMPUTATION Left 03/16/2016   Procedure: AMPUTATION DIGIT LEFT HALLUX;  Surgeon: Trula Slade, DPM;  Location: [place];  Service: Podiatry;  Laterality: Left;  can start around 5   . ARTHROSCOPIC REPAIR ACL Left   .  COLONOSCOPY WITH PROPOFOL N/A 10/28/2015   Procedure: COLONOSCOPY WITH PROPOFOL;  Surgeon: Lollie Sails, MD;  Location: [facility] ENDOSCOPY;  Service: Endoscopy;  Laterality: N/A;  . COLONOSCOPY WITH PROPOFOL N/A 10/29/2015   Procedure: COLONOSCOPY WITH PROPOFOL;  Surgeon: Lollie Sails, MD;  Location: [facility] ENDOSCOPY;  Service: Endoscopy;  Laterality: N/A;  . ESOPHAGOGASTRODUODENOSCOPY (EGD) WITH PROPOFOL N/A 12/27/2017   Procedure: ESOPHAGOGASTRODUODENOSCOPY (EGD) WITH PROPOFOL;  Surgeon: Toledo, Benay Pike, MD;  Location: [name] ENDOSCOPY;  Service: Gastroenterology;  Laterality: N/A;  . PROSTATE SURGERY  2016  . TONSILECTOMY/ADENOIDECTOMY WITH MYRINGOTOMY    . TONSILLECTOMY     Family History  Problem Relation Age of Onset  . CAD Father   . Stroke Father   . Diabetes Mellitus II Mother   . Kidney failure Mother   . Schizophrenia Mother    Social History   Tobacco Use  . Smoking status: Never Smoker  . Smokeless tobacco: Never Used  Substance Use Topics  . Alcohol use: No   Allergies  Allergen Reactions  . Levofloxacin Swelling  . Phenergan [Promethazine Hcl] Other (See Comments)    Cramping   . Malt     Other reaction(s): OTHER  . Soy Allergy Other (See Comments)    Other reaction(s): OTHER   Prior to Admission medications   Medication Sig Start Date End Date Taking? Authorizing Provider  buPROPion (WELLBUTRIN) 75 MG  tablet Take 1 tablet (75 mg total) by mouth every morning. 07/05/17  Yes Ursula Alert, MD  calcitRIOL (ROCALTROL) 0.25 MCG capsule Take 1 capsule by mouth daily. 09/20/17  Yes [provider]  calcium citrate-vitamin D (CITRACAL+D) 315-200 MG-UNIT tablet Take 2 tablets by mouth daily.    Yes [provider]  carvedilol (COREG) 12.5 MG tablet Take 12.5 mg by mouth daily.  09/20/17  Yes [provider]  cholestyramine light (PREVALITE) 4 g packet Take 4 g by mouth daily as needed (digestion).  11/17/15  Yes [provider]   citalopram (CELEXA) 20 MG tablet Take 20 mg by mouth daily.  06/16/17  Yes [provider]  dicyclomine (BENTYL) 20 MG tablet Take 1 tablet (20 mg total) by mouth 3 (three) times daily as needed (abdominal pain). 11/07/17  Yes Nance Pear, MD  diltiazem (CARDIZEM) 120 MG tablet Take 120 mg by mouth daily.    Yes [provider]  FEROSUL 325 (65 Fe) MG tablet Take 325 mg by mouth 2 (two) times daily with a meal.  09/07/17  Yes [provider]  insulin aspart (NOVOLOG) 100 UNIT/ML injection Inject 0-6 Units into the skin 3 (three) times daily before meals. Per sliding scale   Yes [provider]  insulin glargine (LANTUS) 100 UNIT/ML injection Inject 0.15 mLs (15 Units total) into the skin at bedtime. 11/10/17  Yes Fritzi Mandes, MD  lisinopril (PRINIVIL,ZESTRIL) 20 MG tablet Take 20 mg by mouth 2 (two) times daily.  10/04/17  Yes [provider]  metoCLOPramide (REGLAN) 10 MG tablet Take 1 tablet (10 mg total) by mouth every 8 (eight) hours as needed for nausea. 11/07/17 11/07/18 Yes Nance Pear, MD  omeprazole (PRILOSEC) 40 MG capsule Take 40 mg by mouth 3 (three) times daily before meals. 12/27/17  Yes [provider]  ondansetron (ZOFRAN-ODT) 4 MG disintegrating tablet Take 4 mg by mouth every 8 (eight) hours as needed for nausea or vomiting.  10/13/17  Yes [provider]  potassium chloride SA (K-DUR,KLOR-CON) 20 MEQ tablet Take 2 tablets (40 mEq total) by mouth daily. 11/10/17  Yes Fritzi Mandes, MD  rosuvastatin (CRESTOR) 40 MG tablet Take 40 mg by mouth daily.  05/26/17  Yes [provider]  sucralfate (CARAFATE) 1 g tablet Take 1 g by mouth 4 (four) times daily. 12/14/17  Yes [provider]  tamsulosin (FLOMAX) 0.4 MG CAPS capsule Take 0.4 mg by mouth daily after breakfast.    Yes [provider]  torsemide (DEMADEX) 100 MG tablet Take 100 mg by mouth 2 (two) times daily. 12/27/17   [provider]     Review of Systems  Constitutional: Positive for appetite change (decreased) and fatigue.  HENT: Negative for congestion, postnasal drip and sore throat.   Eyes: Negative.   Respiratory: Positive for shortness of breath. Negative for cough and chest tightness.   Cardiovascular: Positive for leg swelling. Negative for chest pain and palpitations.  Gastrointestinal: Positive for abdominal pain, diarrhea, nausea and vomiting (3-4 times/ daily after eating). Negative for abdominal distention.  Endocrine: Negative.   Genitourinary: Negative.   Musculoskeletal: Negative for back pain and neck pain.  Skin: Negative.   Allergic/Immunologic: Negative.   Neurological: Positive for light-headedness. Negative for dizziness.  Hematological: Negative for adenopathy. Does not bruise/bleed easily.  Psychiatric/Behavioral: Positive for sleep disturbance. Negative for dysphoric mood. The patient is not nervous/anxious.    Vitals:   01/12/18 1125  BP: (!) 143/79  Pulse: 82  Resp: 18  SpO2: 100%  Weight: 233 lb 4 oz (105.8 kg)  Height: 6\' 3"  (1.905 m)   Wt Readings from Last 3 Encounters:  01/12/18 233 lb 4 oz (105.8 kg)  01/09/18 236 lb 2 oz (107.1 kg)  12/27/17 229 lb (103.9 kg)   Lab Results  Component Value Date   CREATININE 3.06 (H) 01/09/2018   CREATININE 2.29 (H) 12/17/2017   CREATININE 3.01 (H) 11/28/2017    Physical Exam  Constitutional: He is oriented to person, place, and time. He appears well-developed and well-nourished.  HENT:  Head: Normocephalic and atraumatic.  Neck: Normal range of motion. Neck supple. No JVD present.  Cardiovascular: Normal rate and regular rhythm.  Pulmonary/Chest: Effort normal. He has no wheezes. He has no rales.  Abdominal: Soft. He exhibits no distension. There is tenderness.  Musculoskeletal: He exhibits edema (2+ pitting bilateral lower legs). He exhibits no tenderness.  Neurological: He is alert and oriented to person, place, and time.   Skin: Skin is warm and dry.  Psychiatric: He has a normal mood and affect. His behavior is normal. Thought content normal.  Nursing note and vitals reviewed.  Assessment & Plan:  1: Chronic heart failure with preserved ejection fraction- - NYHA class III - remains moderately fluid overloaded  - weighing daily and he was reminded to call for an overnight weight gain of >2 pounds or a weekly weight gain of >5 pounds - weight down 3 pounds since last visit here 3 days ago - not adding salt to his food and occasionally reads food labels. Reviewed the importance of closely following a 2000mg  sodium diet  - received 80mg  IV lasix/ 6meq 3 days ago; unable to give any more due to renal function - BNP 01/09/18 was 32.0  2: HTN- - BP looks good today - sees PCP [name] Darter) & returns in a couple of weeks - BMP 01/09/18 reviewed and showed sodium 135, potassium 4.5, creatinine 3.06 and GFR 26  3: DM- - says that his nonfasting glucose this morning was 282 - saw nephrology [facility]) couple of weeks ago and returns in 2 weeks - sees endocrinologist next week or so - A1c 11/09/17 was 15.2%  4: Vomiting- - says that he has this 3-4 times/ day along with vomiting after eating/ drinking liquids - wonder if he has diabetic gastroparesis - saw GI Ellin Mayhew) 11/03/17 & says that he's waiting on GI appointment from [facility] - had gastric emptying study 10/12 and UGI/SBFT on 12/06/17 - had EGD done 12/27/17  5: Lymphedema- - stage 2 - limited in his ability to exercise due to multiple episodes of vomiting - not much improvement with compression socks - feels like his edema is better first thing in the morning and then worsens as the day progresses - referral for lymphapress compression boots has been made  Patient did not bring his medications nor a list. Each medication was verbally reviewed with the patient and he was encouraged to bring the bottles to every visit to confirm accuracy of  list.  Return in 2 months or sooner for any questions/problems before then.

## 2018-03-06 NOTE — Progress Notes (Deleted)
Patient ID: [name]., male    DOB: Dec 17, 1970, 48 y.o.   MRN: [id number]  HPI  Mr Kimrey is a 48 y/o male with a history of DM, hyperlipidemia, HTN, CKD, anxiety and chronic heart failure.   Echo report from 07/19/17 reviewed and showed an EF of 75% along with mild MR.   Was in the ED 12/17/17 due to peripheral edema. IV lasix was given twice and he was released. Admitted 11/08/17 due to acute HF exacerbation with a 20 pound weight gain over previous 2 weeks. Initially needed IV lasix and then transitioned to oral diuretics. Nephrology consult obtained. Discharged after 2 days. Was in the ED 11/07/17 due to abdominal pain where he was treated and released.   He presents today for a follow-up visit with a chief complaint of   Past Medical History:  Diagnosis Date  . Anxiety   . Cellulitis   . CHF (congestive heart failure) ([place])   . Chronic diarrhea   . Chronic kidney disease   . Chronic pain of both ankles   . Diabetes mellitus without complication ([place])   . Diabetes mellitus, type II ([place])   . Diabetic neuropathy ([place])   . Frequent falls   . Gait instability   . Heart palpitations   . HLD (hyperlipidemia)   . Hypertension   . Insomnia   . Nausea and vomiting in adult    recurrent   Past Surgical History:  Procedure Laterality Date  . AMPUTATION Left 03/16/2016   Procedure: AMPUTATION DIGIT LEFT HALLUX;  Surgeon: Trula Slade, DPM;  Location: [place];  Service: Podiatry;  Laterality: Left;  can start around 5   . ARTHROSCOPIC REPAIR ACL Left   . COLONOSCOPY WITH PROPOFOL N/A 10/28/2015   Procedure: COLONOSCOPY WITH PROPOFOL;  Surgeon: Lollie Sails, MD;  Location: [facility] ENDOSCOPY;  Service: Endoscopy;  Laterality: N/A;  . COLONOSCOPY WITH PROPOFOL N/A 10/29/2015   Procedure: COLONOSCOPY WITH PROPOFOL;  Surgeon: Lollie Sails, MD;  Location: [facility] ENDOSCOPY;  Service: Endoscopy;  Laterality: N/A;  . ESOPHAGOGASTRODUODENOSCOPY (EGD) WITH PROPOFOL N/A 12/27/2017   Procedure: ESOPHAGOGASTRODUODENOSCOPY (EGD) WITH PROPOFOL;  Surgeon: Toledo, Benay Pike, MD;  Location: [name] ENDOSCOPY;  Service: Gastroenterology;  Laterality: N/A;  . PROSTATE SURGERY  2016  . TONSILECTOMY/ADENOIDECTOMY WITH MYRINGOTOMY    . TONSILLECTOMY     Family History  Problem Relation Age of Onset  . CAD Father   . Stroke Father   . Diabetes Mellitus II Mother   . Kidney failure Mother   . Schizophrenia Mother    Social History   Tobacco Use  . Smoking status: Never Smoker  . Smokeless tobacco: Never Used  Substance Use Topics  . Alcohol use: No   Allergies  Allergen Reactions  . Levofloxacin Swelling  . Phenergan [Promethazine Hcl] Other (See Comments)    Cramping   . Malt     Other reaction(s): OTHER  . Soy Allergy Other (See Comments)    Other reaction(s): OTHER     Review of Systems  Constitutional: Positive for appetite change (decreased) and fatigue.  HENT: Negative for congestion, postnasal drip and sore throat.   Eyes: Negative.   Respiratory: Positive for shortness of breath. Negative for cough and chest tightness.   Cardiovascular: Positive for leg swelling. Negative for chest pain and palpitations.  Gastrointestinal: Positive for abdominal pain, diarrhea, nausea and vomiting (3-4 times/ daily after eating). Negative for abdominal distention.  Endocrine: Negative.   Genitourinary: Negative.  Musculoskeletal: Negative for back pain and neck pain.  Skin: Negative.   Allergic/Immunologic: Negative.   Neurological: Positive for light-headedness. Negative for dizziness.  Hematological: Negative for adenopathy. Does not bruise/bleed easily.  Psychiatric/Behavioral: Positive for sleep disturbance. Negative for dysphoric mood. The patient is not nervous/anxious.      Physical Exam Vitals signs and nursing note reviewed.  Constitutional:      Appearance: He is well-developed.  HENT:     Head: Normocephalic and atraumatic.  Neck:      Musculoskeletal: Normal range of motion and neck supple.     Vascular: No JVD.  Cardiovascular:     Rate and Rhythm: Normal rate and regular rhythm.  Pulmonary:     Effort: Pulmonary effort is normal.     Breath sounds: No wheezing or rales.  Abdominal:     General: There is no distension.     Palpations: Abdomen is soft.     Tenderness: There is abdominal tenderness.  Musculoskeletal:        General: No tenderness.  Skin:    General: Skin is warm and dry.  Neurological:     Mental Status: He is alert and oriented to person, place, and time.  Psychiatric:        Behavior: Behavior normal.        Thought Content: Thought content normal.    Assessment & Plan:  1: Chronic heart failure with preserved ejection fraction- - NYHA class III - remains moderately fluid overloaded  - weighing daily and he was reminded to call for an overnight weight gain of >2 pounds or a weekly weight gain of >5 pounds - weight  - not adding salt to his food and occasionally reads food labels. Reviewed the importance of closely following a 2000mg  sodium diet  -  - BNP 01/09/18 was 32.0  2: HTN- - BP  - sees PCP (Kikel) & returns in a couple of weeks - BMP 01/12/18 reviewed and showed sodium 136, potassium 3.8, creatinine 3.13 and GFR 25  3: DM- - says that his nonfasting glucose this morning was  - saw nephrology [facility]) couple of weeks ago and returns in 2 weeks - sees endocrinologist next week or so - A1c 11/09/17 was 15.2%  4: Vomiting- - says that he has this 3-4 times/ day along with vomiting after eating/ drinking liquids - wonder if he has diabetic gastroparesis - saw GI Ellin Mayhew) 11/03/17 & says that he's waiting on GI appointment from [facility] - had gastric emptying study 10/12 and UGI/SBFT on 12/06/17 - had EGD done 12/27/17  5: Lymphedema- - stage 2 - limited in his ability to exercise due to multiple episodes of vomiting - not much improvement with compression socks - feels  like his edema is better first thing in the morning and then worsens as the day progresses - referral for lymphapress compression boots has been made  Patient did not bring his medications nor a list. Each medication was verbally reviewed with the patient and he was encouraged to bring the bottles to every visit to confirm accuracy of list.

## 2018-03-07 ENCOUNTER — Ambulatory Visit: Payer: BC Managed Care – PPO | Admitting: Family

## 2018-03-07 ENCOUNTER — Telehealth: Payer: Self-pay | Admitting: Family

## 2018-03-07 NOTE — Telephone Encounter (Signed)
Patient did not show for his Heart Failure Clinic appointment on 03/07/2018. Will attempt to reschedule.

## 2018-03-15 ENCOUNTER — Ambulatory Visit: Payer: Self-pay | Admitting: Family

## 2018-03-16 NOTE — Progress Notes (Deleted)
Patient ID: [name]., male    DOB: 1970-12-24, 48 y.o.   MRN: [id number]  HPI  Mr Buckles is a 48 y/o male with a history of DM, hyperlipidemia, HTN, CKD, anxiety and chronic heart failure.   Echo report from 07/19/17 reviewed and showed an EF of 75% along with mild MR.   Was in the ED 12/17/17 due to peripheral edema. IV lasix was given twice and he was released. Admitted 11/08/17 due to acute HF exacerbation with a 20 pound weight gain over previous 2 weeks. Initially needed IV lasix and then transitioned to oral diuretics. Nephrology consult obtained. Discharged after 2 days. Was in the ED 11/07/17 due to abdominal pain where he was treated and released.   He presents today for a follow-up visit with a chief complaint of   Past Medical History:  Diagnosis Date  . Anxiety   . Cellulitis   . CHF (congestive heart failure) ([place])   . Chronic diarrhea   . Chronic kidney disease   . Chronic pain of both ankles   . Diabetes mellitus without complication ([place])   . Diabetes mellitus, type II ([place])   . Diabetic neuropathy ([place])   . Frequent falls   . Gait instability   . Heart palpitations   . HLD (hyperlipidemia)   . Hypertension   . Insomnia   . Nausea and vomiting in adult    recurrent   Past Surgical History:  Procedure Laterality Date  . AMPUTATION Left 03/16/2016   Procedure: AMPUTATION DIGIT LEFT HALLUX;  Surgeon: Trula Slade, DPM;  Location: [place];  Service: Podiatry;  Laterality: Left;  can start around 5   . ARTHROSCOPIC REPAIR ACL Left   . COLONOSCOPY WITH PROPOFOL N/A 10/28/2015   Procedure: COLONOSCOPY WITH PROPOFOL;  Surgeon: Lollie Sails, MD;  Location: [facility] ENDOSCOPY;  Service: Endoscopy;  Laterality: N/A;  . COLONOSCOPY WITH PROPOFOL N/A 10/29/2015   Procedure: COLONOSCOPY WITH PROPOFOL;  Surgeon: Lollie Sails, MD;  Location: [facility] ENDOSCOPY;  Service: Endoscopy;  Laterality: N/A;  . ESOPHAGOGASTRODUODENOSCOPY (EGD) WITH PROPOFOL N/A 12/27/2017   Procedure: ESOPHAGOGASTRODUODENOSCOPY (EGD) WITH PROPOFOL;  Surgeon: Toledo, Benay Pike, MD;  Location: [name] ENDOSCOPY;  Service: Gastroenterology;  Laterality: N/A;  . PROSTATE SURGERY  2016  . TONSILECTOMY/ADENOIDECTOMY WITH MYRINGOTOMY    . TONSILLECTOMY     Family History  Problem Relation Age of Onset  . CAD Father   . Stroke Father   . Diabetes Mellitus II Mother   . Kidney failure Mother   . Schizophrenia Mother    Social History   Tobacco Use  . Smoking status: Never Smoker  . Smokeless tobacco: Never Used  Substance Use Topics  . Alcohol use: No   Allergies  Allergen Reactions  . Levofloxacin Swelling  . Phenergan [Promethazine Hcl] Other (See Comments)    Cramping   . Malt     Other reaction(s): OTHER  . Soy Allergy Other (See Comments)    Other reaction(s): OTHER     Review of Systems  Constitutional: Positive for appetite change (decreased) and fatigue.  HENT: Negative for congestion, postnasal drip and sore throat.   Eyes: Negative.   Respiratory: Positive for shortness of breath. Negative for cough and chest tightness.   Cardiovascular: Positive for leg swelling. Negative for chest pain and palpitations.  Gastrointestinal: Positive for abdominal pain, diarrhea, nausea and vomiting (3-4 times/ daily after eating). Negative for abdominal distention.  Endocrine: Negative.   Genitourinary: Negative.  Musculoskeletal: Negative for back pain and neck pain.  Skin: Negative.   Allergic/Immunologic: Negative.   Neurological: Positive for light-headedness. Negative for dizziness.  Hematological: Negative for adenopathy. Does not bruise/bleed easily.  Psychiatric/Behavioral: Positive for sleep disturbance. Negative for dysphoric mood. The patient is not nervous/anxious.      Physical Exam Vitals signs and nursing note reviewed.  Constitutional:      Appearance: He is well-developed.  HENT:     Head: Normocephalic and atraumatic.  Neck:      Musculoskeletal: Normal range of motion and neck supple.     Vascular: No JVD.  Cardiovascular:     Rate and Rhythm: Normal rate and regular rhythm.  Pulmonary:     Effort: Pulmonary effort is normal.     Breath sounds: No wheezing or rales.  Abdominal:     General: There is no distension.     Palpations: Abdomen is soft.     Tenderness: There is abdominal tenderness.  Musculoskeletal:        General: No tenderness.  Skin:    General: Skin is warm and dry.  Neurological:     Mental Status: He is alert and oriented to person, place, and time.  Psychiatric:        Behavior: Behavior normal.        Thought Content: Thought content normal.    Assessment & Plan:  1: Chronic heart failure with preserved ejection fraction- - NYHA class III - remains moderately fluid overloaded  - weighing daily and he was reminded to call for an overnight weight gain of >2 pounds or a weekly weight gain of >5 pounds - weight  - not adding salt to his food and occasionally reads food labels. Reviewed the importance of closely following a 2000mg  sodium diet  -  - BNP 01/09/18 was 32.0  2: HTN- - BP  - sees PCP (Kikel) & returns in a couple of weeks - BMP 01/12/18 reviewed and showed sodium 136, potassium 3.8, creatinine 3.13 and GFR 25  3: DM- - says that his nonfasting glucose this morning was  - saw nephrology [facility]) couple of weeks ago and returns in 2 weeks - sees endocrinologist next week or so - A1c 11/09/17 was 15.2%  4: Vomiting- - says that he has this 3-4 times/ day along with vomiting after eating/ drinking liquids - wonder if he has diabetic gastroparesis - saw GI Ellin Mayhew) 11/03/17 & says that he's waiting on GI appointment from [facility] - had gastric emptying study 10/12 and UGI/SBFT on 12/06/17 - had EGD done 12/27/17  5: Lymphedema- - stage 2 - limited in his ability to exercise due to multiple episodes of vomiting - not much improvement with compression socks - feels  like his edema is better first thing in the morning and then worsens as the day progresses - referral for lymphapress compression boots has been made  Patient did not bring his medications nor a list. Each medication was verbally reviewed with the patient and he was encouraged to bring the bottles to every visit to confirm accuracy of list.

## 2018-03-17 ENCOUNTER — Ambulatory Visit: Payer: Self-pay | Admitting: Family

## 2018-03-17 ENCOUNTER — Telehealth: Payer: Self-pay | Admitting: Family

## 2018-03-17 NOTE — Telephone Encounter (Signed)
Patient did not show for his Heart Failure Clinic appointment on 03/17/2018. Will attempt to reschedule.

## 2018-03-24 NOTE — Progress Notes (Signed)
Patient ID: [name]., male    DOB: 07-30-1970, 48 y.o.   MRN: [id number]  HPI  [name] is a 48 y/o male with a history of DM, hyperlipidemia, HTN, CKD, anxiety and chronic heart failure.   Echo report from 07/19/17 reviewed and showed an EF of 75% along with mild MR.   Was in the ED 12/17/17 due to peripheral edema. IV lasix was given twice and he was released. Admitted 11/08/17 due to acute HF exacerbation with a 20 pound weight gain over previous 2 weeks. Initially needed IV lasix and then transitioned to oral diuretics. Nephrology consult obtained. Discharged after 2 days. Was in the ED 11/07/17 due to abdominal pain where he was treated and released.   He presents today for a follow-up visit with a chief complaint of moderate fatigue upon minimal exertion. He describes this as chronic in nature having been present for many months. He has associated shortness of breath, pedal edema, abdominal pain, N/V/D, light-headedness and difficulty sleeping along with this. He denies any palpitations, chest pain, cough, abdominal distention or weight gain. Biggest complaint is of his continued GI symptoms.   Past Medical History:  Diagnosis Date  . Anxiety   . Cellulitis   . CHF (congestive heart failure) ([place])   . Chronic diarrhea   . Chronic kidney disease   . Chronic pain of both ankles   . Diabetes mellitus without complication ([place])   . Diabetes mellitus, type II ([place])   . Diabetic neuropathy ([place])   . Frequent falls   . Gait instability   . Heart palpitations   . HLD (hyperlipidemia)   . Hypertension   . Insomnia   . Nausea and vomiting in adult    recurrent   Past Surgical History:  Procedure Laterality Date  . AMPUTATION Left 03/16/2016   Procedure: AMPUTATION DIGIT LEFT HALLUX;  Surgeon: Trula Slade, DPM;  Location: [place];  Service: Podiatry;  Laterality: Left;  can start around 5   . ARTHROSCOPIC REPAIR ACL Left   . COLONOSCOPY WITH PROPOFOL N/A 10/28/2015   Procedure: COLONOSCOPY WITH PROPOFOL;  Surgeon: Lollie Sails, MD;  Location: [facility] ENDOSCOPY;  Service: Endoscopy;  Laterality: N/A;  . COLONOSCOPY WITH PROPOFOL N/A 10/29/2015   Procedure: COLONOSCOPY WITH PROPOFOL;  Surgeon: Lollie Sails, MD;  Location: [facility] ENDOSCOPY;  Service: Endoscopy;  Laterality: N/A;  . ESOPHAGOGASTRODUODENOSCOPY (EGD) WITH PROPOFOL N/A 12/27/2017   Procedure: ESOPHAGOGASTRODUODENOSCOPY (EGD) WITH PROPOFOL;  Surgeon: Toledo, Benay Pike, MD;  Location: [name] ENDOSCOPY;  Service: Gastroenterology;  Laterality: N/A;  . PROSTATE SURGERY  2016  . TONSILECTOMY/ADENOIDECTOMY WITH MYRINGOTOMY    . TONSILLECTOMY     Family History  Problem Relation Age of Onset  . CAD Father   . Stroke Father   . Diabetes Mellitus II Mother   . Kidney failure Mother   . Schizophrenia Mother    Social History   Tobacco Use  . Smoking status: Never Smoker  . Smokeless tobacco: Never Used  Substance Use Topics  . Alcohol use: No   Allergies  Allergen Reactions  . Levofloxacin Swelling  . Phenergan [Promethazine Hcl] Other (See Comments)    Cramping   . Malt     Other reaction(s): OTHER  . Soy Allergy Other (See Comments)    Other reaction(s): OTHER   Prior to Admission medications   Medication Sig Start Date End Date Taking? Authorizing Provider  buPROPion (WELLBUTRIN) 75 MG tablet Take 1 tablet (75 mg total)  by mouth every morning. 07/05/17  Yes Ursula Alert, MD  calcitRIOL (ROCALTROL) 0.25 MCG capsule Take 1 capsule by mouth daily. 09/20/17  Yes [provider]  calcium citrate-vitamin D (CITRACAL+D) 315-200 MG-UNIT tablet Take 2 tablets by mouth daily.    Yes [provider]  carvedilol (COREG) 12.5 MG tablet Take 12.5 mg by mouth daily.  09/20/17  Yes [provider]  cholestyramine light (PREVALITE) 4 g packet Take 4 g by mouth daily as needed (digestion).  11/17/15  Yes [provider]  citalopram (CELEXA) 20 MG tablet Take 20 mg by  mouth daily.  06/16/17  Yes [provider]  dicyclomine (BENTYL) 20 MG tablet Take 1 tablet (20 mg total) by mouth 3 (three) times daily as needed (abdominal pain). 11/07/17  Yes Nance Pear, MD  diltiazem (CARDIZEM) 120 MG tablet Take 120 mg by mouth daily.    Yes [provider]  FEROSUL 325 (65 Fe) MG tablet Take 325 mg by mouth 2 (two) times daily with a meal.  09/07/17  Yes [provider]  insulin aspart (NOVOLOG) 100 UNIT/ML injection Inject 0-6 Units into the skin 3 (three) times daily before meals. Per sliding scale   Yes [provider]  insulin glargine (LANTUS) 100 UNIT/ML injection Inject 0.15 mLs (15 Units total) into the skin at bedtime. 11/10/17  Yes Fritzi Mandes, MD  lisinopril (PRINIVIL,ZESTRIL) 20 MG tablet Take 20 mg by mouth 2 (two) times daily.  10/04/17  Yes [provider]  metoCLOPramide (REGLAN) 10 MG tablet Take 1 tablet (10 mg total) by mouth every 8 (eight) hours as needed for nausea. 11/07/17 11/07/18 Yes Nance Pear, MD  omeprazole (PRILOSEC) 40 MG capsule Take 40 mg by mouth 3 (three) times daily before meals. 12/27/17  Yes [provider]  ondansetron (ZOFRAN-ODT) 4 MG disintegrating tablet Take 4 mg by mouth every 8 (eight) hours as needed for nausea or vomiting.  10/13/17  Yes [provider]  potassium chloride SA (K-DUR,KLOR-CON) 20 MEQ tablet Take 2 tablets (40 mEq total) by mouth daily. 11/10/17  Yes Fritzi Mandes, MD  rosuvastatin (CRESTOR) 40 MG tablet Take 40 mg by mouth daily.  05/26/17  Yes [provider]  sucralfate (CARAFATE) 1 g tablet Take 1 g by mouth 4 (four) times daily. 12/14/17  Yes [provider]  tamsulosin (FLOMAX) 0.4 MG CAPS capsule Take 0.4 mg by mouth daily after breakfast.    Yes [provider]  torsemide (DEMADEX) 100 MG tablet Take 100 mg by mouth 2 (two) times daily. 12/27/17  Yes [provider]    Review of Systems  Constitutional:  Positive for appetite change (decreased) and fatigue.  HENT: Negative for congestion, postnasal drip and sore throat.   Eyes: Negative.   Respiratory: Positive for shortness of breath. Negative for cough and chest tightness.   Cardiovascular: Positive for leg swelling. Negative for chest pain and palpitations.  Gastrointestinal: Positive for abdominal pain, diarrhea (at times), nausea and vomiting (twice daily especially in the morning). Negative for abdominal distention.  Endocrine: Negative.   Genitourinary: Negative.   Musculoskeletal: Negative for back pain and neck pain.  Skin: Negative.   Allergic/Immunologic: Negative.   Neurological: Positive for light-headedness (at times). Negative for dizziness.  Hematological: Negative for adenopathy. Does not bruise/bleed easily.  Psychiatric/Behavioral: Positive for sleep disturbance. Negative for dysphoric mood. The patient is not nervous/anxious.    Vitals:   03/27/18 0853  BP: (!) 158/97  Pulse: 91  Resp: 18  SpO2: 100%  Weight: 226 lb 6 oz (102.7 kg)  Height: 6\' 3"  (1.905 m)   Wt Readings from Last 3 Encounters:  03/27/18 226 lb 6 oz (102.7 kg)  01/12/18 233 lb 4 oz (105.8 kg)  01/09/18 236 lb 2 oz (107.1 kg)   Lab Results  Component Value Date   CREATININE 3.13 (H) 01/12/2018   CREATININE 3.06 (H) 01/09/2018   CREATININE 2.29 (H) 12/17/2017    Physical Exam Vitals signs and nursing note reviewed.  Constitutional:      Appearance: He is well-developed.  HENT:     Head: Normocephalic and atraumatic.  Neck:     Musculoskeletal: Normal range of motion and neck supple.     Vascular: No JVD.  Cardiovascular:     Rate and Rhythm: Normal rate and regular rhythm.  Pulmonary:     Effort: Pulmonary effort is normal.     Breath sounds: No wheezing or rales.  Abdominal:     General: There is no distension.     Palpations: Abdomen is soft.     Tenderness: There is abdominal tenderness.  Musculoskeletal:        General: No  tenderness.     Right lower leg: Edema (2+ pitting) present.     Left lower leg: Edema (2+ pitting) present.  Skin:    General: Skin is warm and dry.  Neurological:     Mental Status: He is alert and oriented to person, place, and time.  Psychiatric:        Behavior: Behavior normal.        Thought Content: Thought content normal.    Assessment & Plan:  1: Chronic heart failure with preserved ejection fraction- - NYHA class III - euvolemic today - weighing daily and he was reminded to call for an overnight weight gain of >2 pounds or a weekly weight gain of >5 pounds - weight down 7 pounds since he was last here 2 months ago - not adding salt to his food and occasionally reads food labels. Reviewed the importance of closely following a 2000mg  sodium diet  - BNP 01/09/18 was 32.0 - reports receiving his flu vaccine for this season  2: HTN- - BP mildly elevated today; continues to have N/V/D - BP looked good at GI appointment a few weeks ago - sees PCP [name])  - BMP 01/12/18 reviewed and showed sodium 136, potassium 3.8, creatinine 3.13 and GFR 25  3: DM- - says that his nonfasting glucose this morning was 218 - sees nephrology [name]) - saw endocrinologist [name]) 01/26/18 - A1c 11/09/17 was 15.2%  4: Vomiting- - says that he has this 2-3 times/ day along with vomiting after eating/ drinking liquids - saw GI [name]) 02/27/2018 - had gastric emptying study 10/12 and UGI/SBFT on 12/06/17 - had EGD done 12/27/17  5: Lymphedema- - stage 2 - limited in his ability to exercise due to multiple episodes of vomiting - wearing compression socks daily but without much improvement in edema - does elevate his legs when sitting for long periods of time - feels like his edema is better first thing in the morning and then worsens as the day progresses - lymphapress compression boot referral has been made  Patient did not bring his medications nor a list. Each medication was  verbally reviewed with the patient and he was encouraged to bring the bottles to every visit to confirm accuracy of list.  Return in 2 months or sooner for any questions/problems before then.

## 2018-03-27 ENCOUNTER — Encounter: Payer: Self-pay | Admitting: Family

## 2018-03-27 ENCOUNTER — Ambulatory Visit: Payer: Medicare (Managed Care) | Attending: Family | Admitting: Family

## 2018-03-27 VITALS — BP 158/97 | HR 91 | Resp 18 | Ht 75.0 in | Wt 226.4 lb

## 2018-03-27 DIAGNOSIS — I5032 Chronic diastolic (congestive) heart failure: Secondary | ICD-10-CM | POA: Diagnosis present

## 2018-03-27 DIAGNOSIS — Z8249 Family history of ischemic heart disease and other diseases of the circulatory system: Secondary | ICD-10-CM | POA: Insufficient documentation

## 2018-03-27 DIAGNOSIS — E785 Hyperlipidemia, unspecified: Secondary | ICD-10-CM | POA: Diagnosis not present

## 2018-03-27 DIAGNOSIS — I130 Hypertensive heart and chronic kidney disease with heart failure and stage 1 through stage 4 chronic kidney disease, or unspecified chronic kidney disease: Secondary | ICD-10-CM | POA: Diagnosis not present

## 2018-03-27 DIAGNOSIS — E1140 Type 2 diabetes mellitus with diabetic neuropathy, unspecified: Secondary | ICD-10-CM | POA: Insufficient documentation

## 2018-03-27 DIAGNOSIS — N189 Chronic kidney disease, unspecified: Secondary | ICD-10-CM | POA: Diagnosis not present

## 2018-03-27 DIAGNOSIS — E1165 Type 2 diabetes mellitus with hyperglycemia: Secondary | ICD-10-CM

## 2018-03-27 DIAGNOSIS — E1122 Type 2 diabetes mellitus with diabetic chronic kidney disease: Secondary | ICD-10-CM | POA: Insufficient documentation

## 2018-03-27 DIAGNOSIS — R112 Nausea with vomiting, unspecified: Secondary | ICD-10-CM

## 2018-03-27 DIAGNOSIS — Z79899 Other long term (current) drug therapy: Secondary | ICD-10-CM | POA: Diagnosis not present

## 2018-03-27 DIAGNOSIS — G4700 Insomnia, unspecified: Secondary | ICD-10-CM | POA: Diagnosis not present

## 2018-03-27 DIAGNOSIS — I890 Lymphedema, not elsewhere classified: Secondary | ICD-10-CM | POA: Insufficient documentation

## 2018-03-27 DIAGNOSIS — Z833 Family history of diabetes mellitus: Secondary | ICD-10-CM | POA: Insufficient documentation

## 2018-03-27 DIAGNOSIS — I10 Essential (primary) hypertension: Secondary | ICD-10-CM

## 2018-03-27 DIAGNOSIS — Z888 Allergy status to other drugs, medicaments and biological substances status: Secondary | ICD-10-CM | POA: Insufficient documentation

## 2018-03-27 DIAGNOSIS — Z794 Long term (current) use of insulin: Secondary | ICD-10-CM | POA: Diagnosis not present

## 2018-03-27 DIAGNOSIS — Z881 Allergy status to other antibiotic agents status: Secondary | ICD-10-CM | POA: Insufficient documentation

## 2018-03-27 NOTE — Patient Instructions (Signed)
Continue weighing daily and call for an overnight weight gain of > 2 pounds or a weekly weight gain of >5 pounds. 

## 2018-05-24 ENCOUNTER — Ambulatory Visit: Payer: Medicare (Managed Care) | Admitting: Family

## 2018-06-13 ENCOUNTER — Telehealth: Payer: Self-pay | Admitting: Family

## 2018-06-13 NOTE — Telephone Encounter (Signed)
Returned patient's call regarding worsening edema. He says that it's been worsening for a few days now and his legs feel quite tight. Has been elevating them and wearing the compression boots. Monitoring his sodium and fluid intake. Says that he thinks he weighed 238 pounds today but doesn't sound completely sure of this. Thinks he weighed 241 yesterday.   Most recent creatinine level that I can see is from [facility] on 04/05/2018 which showed creatinine 3.3 and GFR 24. He says that he had a virtual visit with nephrology [facility]) a couple of weeks ago and says there was mention of getting updated lab work done in [facility] which is closer for the patient but he hasn't heard anything from them.  He is currently taking torsemide 80mg  BID.  With renal function the way it is, advised patient to call Dr. Assunta Gambles office to get the status of lab orders. Instructed patient to explain to provider that his swelling was worsening and current diuretic does not seem to be working. I am hesitant to add metolazone or give IV lasix with this renal function even though his levels have been fairly stable for the last few months. Creatinine on 11/02/17 was 1.9 and on 04/05/2018 was 3.3.   Patient verbalized understanding and was comfortable with this plan.

## 2018-06-16 ENCOUNTER — Other Ambulatory Visit: Payer: Self-pay

## 2018-06-16 ENCOUNTER — Encounter (INDEPENDENT_AMBULATORY_CARE_PROVIDER_SITE_OTHER): Payer: Medicare (Managed Care) | Admitting: Ophthalmology

## 2018-06-16 DIAGNOSIS — I10 Essential (primary) hypertension: Secondary | ICD-10-CM

## 2018-06-16 DIAGNOSIS — E103412 Type 1 diabetes mellitus with severe nonproliferative diabetic retinopathy with macular edema, left eye: Secondary | ICD-10-CM | POA: Diagnosis not present

## 2018-06-16 DIAGNOSIS — H43813 Vitreous degeneration, bilateral: Secondary | ICD-10-CM

## 2018-06-16 DIAGNOSIS — E103591 Type 1 diabetes mellitus with proliferative diabetic retinopathy without macular edema, right eye: Secondary | ICD-10-CM | POA: Diagnosis not present

## 2018-06-16 DIAGNOSIS — H35033 Hypertensive retinopathy, bilateral: Secondary | ICD-10-CM

## 2018-06-16 DIAGNOSIS — E10311 Type 1 diabetes mellitus with unspecified diabetic retinopathy with macular edema: Secondary | ICD-10-CM

## 2018-06-20 IMAGING — CR DG FOOT 2V*L*
1 series · 2 of 2 positions shown · non-contrast
Comparison: 01/24/2016

CLINICAL DATA: Big toe on left foot had nail removed. Wound there.
Whole left foot bandaged. 2 views per dr order. Non healing wound of
lower extremity, unspecified laterality.

EXAM:
LEFT FOOT - 2 VIEW

[Series 1: x foot ap left · 0.14mm/px · 2 of 2 slices shown]
[im 1/2]
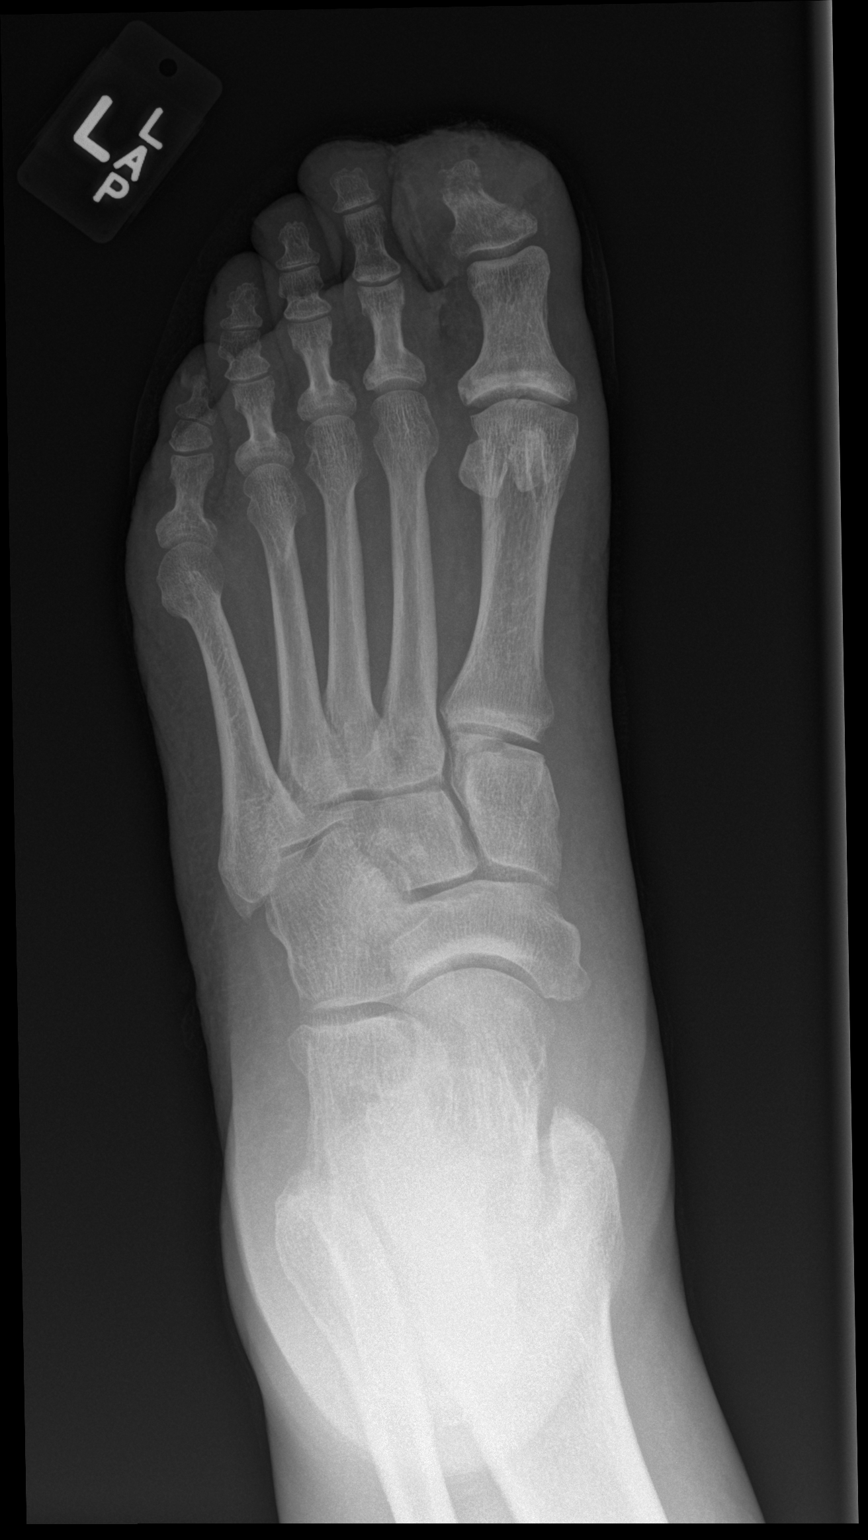
[im 2/2]
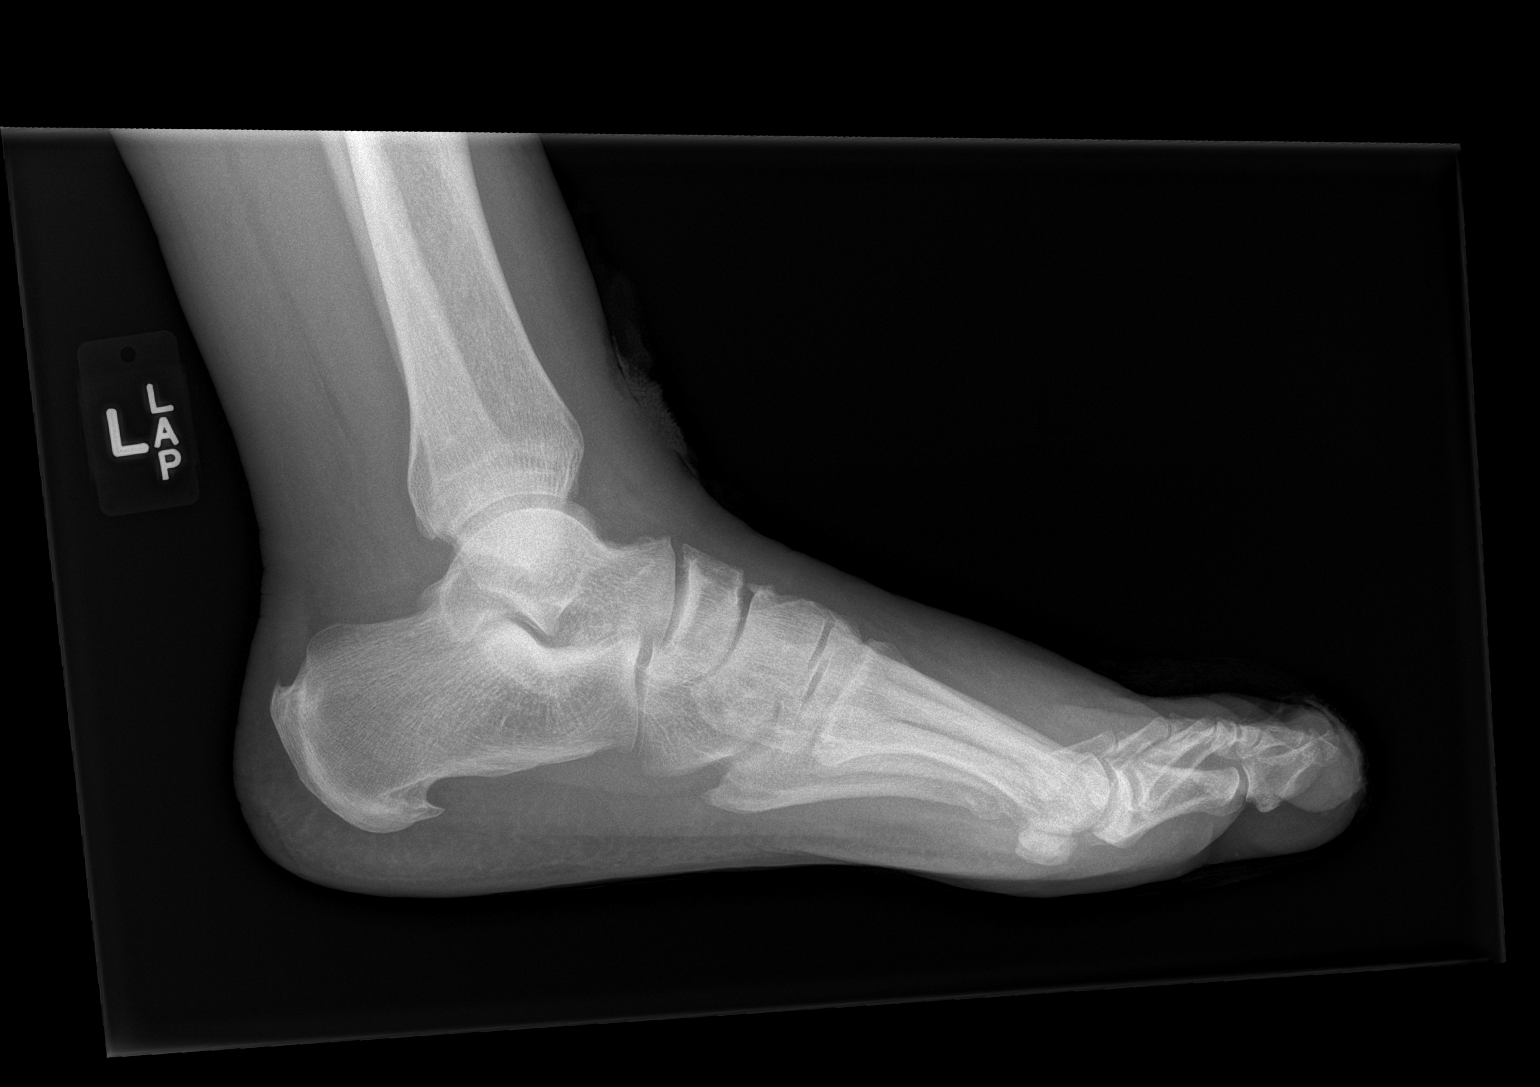

[2 of 2 positions shown; findings below may reference images not displayed]

FINDINGS: There is a fracture across the distal tuft of the great toe,
nondisplaced, with ill-defined fracture margins. There is an area of
resorption of the cortex along the medial base of the distal phalanx
of the great toe. These findings are new from the prior radiographs.
There also focal areas of bone resorption along the medial margin of
the distal phalanx of the fifth toe, along the distal shaft and
distal tuft, not present on the prior study.

No other fractures or areas of bone resorption.

The joints are normally aligned.

There is great toe soft tissue swelling.
IMPRESSION: 1. Findings are consistent with osteomyelitis of the distal phalanx
of the left great toe with an associated pathologic fracture across
the distal tuft.
2. More smoothly defined areas of resorption along the distal
phalanx of the fifth toe are also suggestive of osteomyelitis but
warrants clinical correlation.

## 2018-06-22 ENCOUNTER — Ambulatory Visit
Admission: RE | Admit: 2018-06-22 | Discharge: 2018-06-22 | Disposition: A | Discharge status: Home / Self Care | Payer: Medicare (Managed Care) | Source: Ambulatory Visit | Attending: Emergency Medicine | Admitting: Emergency Medicine

## 2018-06-22 ENCOUNTER — Other Ambulatory Visit: Payer: Self-pay | Admitting: Family

## 2018-06-22 ENCOUNTER — Other Ambulatory Visit: Payer: Self-pay

## 2018-06-22 ENCOUNTER — Telehealth: Payer: Self-pay | Admitting: Family

## 2018-06-22 DIAGNOSIS — I5033 Acute on chronic diastolic (congestive) heart failure: Secondary | ICD-10-CM

## 2018-06-22 LAB — BASIC METABOLIC PANEL
Anion gap: 6 (ref 5–15)
BUN: 47 mg/dL — ABNORMAL HIGH (ref 6–20)
CO2: 29 mmol/L (ref 22–32)
Calcium: 8 mg/dL — ABNORMAL LOW (ref 8.9–10.3)
Chloride: 107 mmol/L (ref 98–111)
Creatinine, Ser: 3.09 mg/dL — ABNORMAL HIGH (ref 0.61–1.24)
GFR calc Af Amer: 26 mL/min — ABNORMAL LOW (ref >60)
GFR calc non Af Amer: 22 mL/min — ABNORMAL LOW (ref >60)
Glucose, Bld: 257 mg/dL — ABNORMAL HIGH (ref 70–99)
Potassium: 3.4 mmol/L — ABNORMAL LOW (ref 3.5–5.1)
Sodium: 142 mmol/L (ref 135–145)

## 2018-06-22 LAB — BRAIN NATRIURETIC PEPTIDE: B Natriuretic Peptide: 53 pg/mL (ref 0.0–100.0)

## 2018-06-22 MED ORDER — FUROSEMIDE 10 MG/ML IJ SOLN
INTRAMUSCULAR | Status: AC
  Administered 2018-06-22: 15:00:00 80 mg via INTRAVENOUS
  Filled 2018-06-22: qty 8

## 2018-06-22 MED ORDER — POTASSIUM CHLORIDE CRYS ER 20 MEQ PO TBCR
40.0000 meq | EXTENDED_RELEASE_TABLET | Freq: Once | ORAL | Status: AC
  Administered 2018-06-22: 40 meq via ORAL

## 2018-06-22 MED ORDER — FUROSEMIDE 10 MG/ML IJ SOLN
80.0000 mg | Freq: Once | INTRAMUSCULAR | Status: AC
  Administered 2018-06-22: 80 mg via INTRAVENOUS

## 2018-06-22 MED ORDER — SODIUM CHLORIDE FLUSH 0.9 % IV SOLN
INTRAVENOUS | Status: AC
  Filled 2018-06-22: qty 20

## 2018-06-22 MED ORDER — POTASSIUM CHLORIDE CRYS ER 20 MEQ PO TBCR
EXTENDED_RELEASE_TABLET | ORAL | Status: AC
  Administered 2018-06-22: 15:00:00 40 meq via ORAL
  Filled 2018-06-22: qty 2

## 2018-06-22 NOTE — Telephone Encounter (Signed)
Patient called to say that the swelling in his legs was getting worse and that his knees and thighs now feel tight. Is unsure of his weight today but says that his breathing isn't any worse.  Has had recent lab work drawn by nephrology and had his torsemide increased to 100mg  Bid but patient doesn't think it's helping any. Says that he still has episodes where he can't keep his mediations down to the chronic nausea/ vomiting that he has.   Will send patient to same day surgery this afternoon for IV lasix (80mg )/ 86meq potassium. Patient is agreeable with this plan.

## 2018-06-23 ENCOUNTER — Encounter (INDEPENDENT_AMBULATORY_CARE_PROVIDER_SITE_OTHER): Payer: Medicare (Managed Care) | Admitting: Ophthalmology

## 2018-06-23 ENCOUNTER — Other Ambulatory Visit: Payer: Self-pay

## 2018-06-23 DIAGNOSIS — E11311 Type 2 diabetes mellitus with unspecified diabetic retinopathy with macular edema: Secondary | ICD-10-CM | POA: Diagnosis not present

## 2018-06-23 DIAGNOSIS — E113511 Type 2 diabetes mellitus with proliferative diabetic retinopathy with macular edema, right eye: Secondary | ICD-10-CM

## 2018-06-23 DIAGNOSIS — E113412 Type 2 diabetes mellitus with severe nonproliferative diabetic retinopathy with macular edema, left eye: Secondary | ICD-10-CM

## 2018-06-26 ENCOUNTER — Telehealth: Payer: Medicare (Managed Care) | Admitting: Family

## 2018-06-26 ENCOUNTER — Telehealth: Payer: Self-pay | Admitting: Family

## 2018-06-26 NOTE — Telephone Encounter (Signed)
Patient did not return messages left regarding [facility] telemedicine visit on 06/26/2018. Will attempt to reschedule.

## 2018-07-20 ENCOUNTER — Encounter (INDEPENDENT_AMBULATORY_CARE_PROVIDER_SITE_OTHER): Payer: Medicare (Managed Care) | Admitting: Ophthalmology

## 2018-07-24 ENCOUNTER — Other Ambulatory Visit: Payer: Self-pay

## 2018-07-24 ENCOUNTER — Encounter (INDEPENDENT_AMBULATORY_CARE_PROVIDER_SITE_OTHER): Payer: Medicare (Managed Care) | Admitting: Ophthalmology

## 2018-07-24 DIAGNOSIS — E113412 Type 2 diabetes mellitus with severe nonproliferative diabetic retinopathy with macular edema, left eye: Secondary | ICD-10-CM

## 2018-07-24 DIAGNOSIS — E113511 Type 2 diabetes mellitus with proliferative diabetic retinopathy with macular edema, right eye: Secondary | ICD-10-CM

## 2018-07-24 DIAGNOSIS — I10 Essential (primary) hypertension: Secondary | ICD-10-CM | POA: Diagnosis not present

## 2018-07-24 DIAGNOSIS — E11311 Type 2 diabetes mellitus with unspecified diabetic retinopathy with macular edema: Secondary | ICD-10-CM

## 2018-07-24 DIAGNOSIS — H35033 Hypertensive retinopathy, bilateral: Secondary | ICD-10-CM

## 2018-07-24 DIAGNOSIS — H43813 Vitreous degeneration, bilateral: Secondary | ICD-10-CM

## 2018-07-26 ENCOUNTER — Other Ambulatory Visit: Payer: Self-pay | Admitting: Family

## 2018-07-26 MED ORDER — SPIRONOLACTONE 25 MG PO TABS: 25.0000 mg | ORAL_TABLET | Freq: Every day | ORAL | 3 refills | Status: DC

## 2018-08-21 ENCOUNTER — Encounter (INDEPENDENT_AMBULATORY_CARE_PROVIDER_SITE_OTHER): Payer: Medicare (Managed Care) | Admitting: Ophthalmology

## 2018-08-29 ENCOUNTER — Other Ambulatory Visit: Payer: Self-pay

## 2018-08-29 ENCOUNTER — Encounter (INDEPENDENT_AMBULATORY_CARE_PROVIDER_SITE_OTHER): Payer: Medicare (Managed Care) | Admitting: Ophthalmology

## 2018-08-29 DIAGNOSIS — E113511 Type 2 diabetes mellitus with proliferative diabetic retinopathy with macular edema, right eye: Secondary | ICD-10-CM | POA: Diagnosis not present

## 2018-08-29 DIAGNOSIS — H2513 Age-related nuclear cataract, bilateral: Secondary | ICD-10-CM

## 2018-08-29 DIAGNOSIS — I10 Essential (primary) hypertension: Secondary | ICD-10-CM | POA: Diagnosis not present

## 2018-08-29 DIAGNOSIS — H35033 Hypertensive retinopathy, bilateral: Secondary | ICD-10-CM

## 2018-08-29 DIAGNOSIS — E113412 Type 2 diabetes mellitus with severe nonproliferative diabetic retinopathy with macular edema, left eye: Secondary | ICD-10-CM | POA: Diagnosis not present

## 2018-08-29 DIAGNOSIS — E11311 Type 2 diabetes mellitus with unspecified diabetic retinopathy with macular edema: Secondary | ICD-10-CM | POA: Diagnosis not present

## 2018-08-29 DIAGNOSIS — H43813 Vitreous degeneration, bilateral: Secondary | ICD-10-CM

## 2018-09-13 ENCOUNTER — Emergency Department: Payer: Medicare (Managed Care)

## 2018-09-13 ENCOUNTER — Encounter: Payer: Self-pay | Admitting: Emergency Medicine

## 2018-09-13 ENCOUNTER — Other Ambulatory Visit: Payer: Self-pay

## 2018-09-13 ENCOUNTER — Inpatient Hospital Stay
Admission: EM | Admit: 2018-09-13 | Discharge: 2018-09-15 | DRG: 683 | Disposition: A | Discharge status: Home / Self Care | Payer: Medicare (Managed Care) | Attending: Internal Medicine | Admitting: Internal Medicine

## 2018-09-13 DIAGNOSIS — D631 Anemia in chronic kidney disease: Secondary | ICD-10-CM | POA: Diagnosis present

## 2018-09-13 DIAGNOSIS — E11319 Type 2 diabetes mellitus with unspecified diabetic retinopathy without macular edema: Secondary | ICD-10-CM | POA: Diagnosis present

## 2018-09-13 DIAGNOSIS — Z888 Allergy status to other drugs, medicaments and biological substances status: Secondary | ICD-10-CM | POA: Diagnosis not present

## 2018-09-13 DIAGNOSIS — N184 Chronic kidney disease, stage 4 (severe): Secondary | ICD-10-CM | POA: Diagnosis present

## 2018-09-13 DIAGNOSIS — K3184 Gastroparesis: Secondary | ICD-10-CM | POA: Diagnosis present

## 2018-09-13 DIAGNOSIS — Z794 Long term (current) use of insulin: Secondary | ICD-10-CM | POA: Diagnosis not present

## 2018-09-13 DIAGNOSIS — Z20828 Contact with and (suspected) exposure to other viral communicable diseases: Secondary | ICD-10-CM | POA: Diagnosis present

## 2018-09-13 DIAGNOSIS — Z79899 Other long term (current) drug therapy: Secondary | ICD-10-CM

## 2018-09-13 DIAGNOSIS — Z833 Family history of diabetes mellitus: Secondary | ICD-10-CM

## 2018-09-13 DIAGNOSIS — E860 Dehydration: Secondary | ICD-10-CM

## 2018-09-13 DIAGNOSIS — Z8249 Family history of ischemic heart disease and other diseases of the circulatory system: Secondary | ICD-10-CM

## 2018-09-13 DIAGNOSIS — I130 Hypertensive heart and chronic kidney disease with heart failure and stage 1 through stage 4 chronic kidney disease, or unspecified chronic kidney disease: Secondary | ICD-10-CM | POA: Diagnosis present

## 2018-09-13 DIAGNOSIS — E1169 Type 2 diabetes mellitus with other specified complication: Secondary | ICD-10-CM

## 2018-09-13 DIAGNOSIS — E1165 Type 2 diabetes mellitus with hyperglycemia: Secondary | ICD-10-CM | POA: Diagnosis present

## 2018-09-13 DIAGNOSIS — E782 Mixed hyperlipidemia: Secondary | ICD-10-CM | POA: Diagnosis present

## 2018-09-13 DIAGNOSIS — I10 Essential (primary) hypertension: Secondary | ICD-10-CM | POA: Diagnosis present

## 2018-09-13 DIAGNOSIS — N179 Acute kidney failure, unspecified: Principal | ICD-10-CM

## 2018-09-13 DIAGNOSIS — E1143 Type 2 diabetes mellitus with diabetic autonomic (poly)neuropathy: Secondary | ICD-10-CM | POA: Diagnosis present

## 2018-09-13 DIAGNOSIS — N400 Enlarged prostate without lower urinary tract symptoms: Secondary | ICD-10-CM | POA: Diagnosis present

## 2018-09-13 DIAGNOSIS — E1122 Type 2 diabetes mellitus with diabetic chronic kidney disease: Secondary | ICD-10-CM | POA: Diagnosis present

## 2018-09-13 DIAGNOSIS — Z89412 Acquired absence of left great toe: Secondary | ICD-10-CM | POA: Diagnosis not present

## 2018-09-13 DIAGNOSIS — Z881 Allergy status to other antibiotic agents status: Secondary | ICD-10-CM | POA: Diagnosis not present

## 2018-09-13 DIAGNOSIS — E785 Hyperlipidemia, unspecified: Secondary | ICD-10-CM | POA: Diagnosis present

## 2018-09-13 DIAGNOSIS — M25572 Pain in left ankle and joints of left foot: Secondary | ICD-10-CM | POA: Diagnosis present

## 2018-09-13 DIAGNOSIS — Z841 Family history of disorders of kidney and ureter: Secondary | ICD-10-CM

## 2018-09-13 DIAGNOSIS — G8929 Other chronic pain: Secondary | ICD-10-CM | POA: Diagnosis present

## 2018-09-13 DIAGNOSIS — I5032 Chronic diastolic (congestive) heart failure: Secondary | ICD-10-CM | POA: Diagnosis present

## 2018-09-13 DIAGNOSIS — Z91018 Allergy to other foods: Secondary | ICD-10-CM | POA: Diagnosis not present

## 2018-09-13 DIAGNOSIS — M25571 Pain in right ankle and joints of right foot: Secondary | ICD-10-CM | POA: Diagnosis present

## 2018-09-13 DIAGNOSIS — R296 Repeated falls: Secondary | ICD-10-CM | POA: Diagnosis present

## 2018-09-13 DIAGNOSIS — N170 Acute kidney failure with tubular necrosis: Secondary | ICD-10-CM | POA: Diagnosis present

## 2018-09-13 DIAGNOSIS — R110 Nausea: Secondary | ICD-10-CM

## 2018-09-13 LAB — COMPREHENSIVE METABOLIC PANEL
ALT: 17 U/L (ref 0–44)
AST: 14 U/L — ABNORMAL LOW (ref 15–41)
Albumin: 2.4 g/dL — ABNORMAL LOW (ref 3.5–5.0)
Alkaline Phosphatase: 51 U/L (ref 38–126)
Anion gap: 9 (ref 5–15)
BUN: 61 mg/dL — ABNORMAL HIGH (ref 6–20)
CO2: 25 mmol/L (ref 22–32)
Calcium: 8.1 mg/dL — ABNORMAL LOW (ref 8.9–10.3)
Chloride: 108 mmol/L (ref 98–111)
Creatinine, Ser: 5.6 mg/dL — ABNORMAL HIGH (ref 0.61–1.24)
GFR calc Af Amer: 13 mL/min — ABNORMAL LOW (ref >60)
GFR calc non Af Amer: 11 mL/min — ABNORMAL LOW (ref >60)
Glucose, Bld: 151 mg/dL — ABNORMAL HIGH (ref 70–99)
Potassium: 3.6 mmol/L (ref 3.5–5.1)
Sodium: 142 mmol/L (ref 135–145)
Total Bilirubin: 0.6 mg/dL (ref 0.3–1.2)
Total Protein: 5.7 g/dL — ABNORMAL LOW (ref 6.5–8.1)

## 2018-09-13 LAB — CBC
HCT: 28.3 % — ABNORMAL LOW (ref 39.0–52.0)
Hemoglobin: 8.8 g/dL — ABNORMAL LOW (ref 13.0–17.0)
MCH: 23 pg — ABNORMAL LOW (ref 26.0–34.0)
MCHC: 31.1 g/dL (ref 30.0–36.0)
MCV: 74.1 fL — ABNORMAL LOW (ref 80.0–100.0)
Platelets: 260 10*3/uL (ref 150–400)
RBC: 3.82 MIL/uL — ABNORMAL LOW (ref 4.22–5.81)
RDW: 13.9 % (ref 11.5–15.5)
WBC: 7.7 10*3/uL (ref 4.0–10.5)
nRBC: 0 % (ref 0.0–0.2)

## 2018-09-13 LAB — SARS CORONAVIRUS 2 BY RT PCR (HOSPITAL ORDER, PERFORMED IN ~~LOC~~ HOSPITAL LAB): SARS Coronavirus 2: NEGATIVE

## 2018-09-13 LAB — LIPASE, BLOOD: Lipase: 22 U/L (ref 11–51)

## 2018-09-13 LAB — GLUCOSE, CAPILLARY: Glucose-Capillary: 160 mg/dL — ABNORMAL HIGH (ref 70–99)

## 2018-09-13 MED ORDER — SODIUM CHLORIDE 0.9 % IV BOLUS
250.0000 mL | Freq: Once | INTRAVENOUS | Status: AC
  Administered 2018-09-13: 250 mL via INTRAVENOUS

## 2018-09-13 MED ORDER — INSULIN ASPART 100 UNIT/ML ~~LOC~~ SOLN
0.0000 [IU] | Freq: Four times a day (QID) | SUBCUTANEOUS | Status: DC
  Administered 2018-09-14 (×2): 2 [IU] via SUBCUTANEOUS
  Administered 2018-09-15: 1 [IU] via SUBCUTANEOUS
  Filled 2018-09-13 (×3): qty 1

## 2018-09-13 MED ORDER — LISINOPRIL 20 MG PO TABS
20.0000 mg | ORAL_TABLET | Freq: Two times a day (BID) | ORAL | Status: DC
  Administered 2018-09-14: 20 mg via ORAL
  Filled 2018-09-13: qty 1

## 2018-09-13 MED ORDER — ROSUVASTATIN CALCIUM 10 MG PO TABS
40.0000 mg | ORAL_TABLET | Freq: Every day | ORAL | Status: DC
  Administered 2018-09-14 – 2018-09-15 (×2): 40 mg via ORAL
  Filled 2018-09-13 (×2): qty 4

## 2018-09-13 MED ORDER — ACETAMINOPHEN 650 MG RE SUPP: 650.0000 mg | Freq: Four times a day (QID) | RECTAL | Status: DC | PRN

## 2018-09-13 MED ORDER — CARVEDILOL 12.5 MG PO TABS
12.5000 mg | ORAL_TABLET | Freq: Every day | ORAL | Status: DC
  Administered 2018-09-14 – 2018-09-15 (×2): 12.5 mg via ORAL
  Filled 2018-09-13 (×2): qty 1

## 2018-09-13 MED ORDER — SODIUM CHLORIDE 0.9 % IV SOLN
Freq: Once | INTRAVENOUS | Status: AC
  Administered 2018-09-14: 01:00:00 via INTRAVENOUS

## 2018-09-13 MED ORDER — PANTOPRAZOLE SODIUM 40 MG PO TBEC: 40.0000 mg | DELAYED_RELEASE_TABLET | Freq: Two times a day (BID) | ORAL | Status: DC

## 2018-09-13 MED ORDER — CITALOPRAM HYDROBROMIDE 20 MG PO TABS
20.0000 mg | ORAL_TABLET | Freq: Every day | ORAL | Status: DC
  Administered 2018-09-14 – 2018-09-15 (×2): 20 mg via ORAL
  Filled 2018-09-13 (×2): qty 1

## 2018-09-13 MED ORDER — ONDANSETRON HCL 4 MG PO TABS: 4.0000 mg | ORAL_TABLET | Freq: Four times a day (QID) | ORAL | Status: DC | PRN

## 2018-09-13 MED ORDER — LABETALOL HCL 5 MG/ML IV SOLN
5.0000 mg | Freq: Once | INTRAVENOUS | Status: AC
  Administered 2018-09-13: 5 mg via INTRAVENOUS
  Filled 2018-09-13: qty 4

## 2018-09-13 MED ORDER — DILTIAZEM HCL 60 MG PO TABS
120.0000 mg | ORAL_TABLET | Freq: Every day | ORAL | Status: DC
  Administered 2018-09-14 – 2018-09-15 (×2): 120 mg via ORAL
  Filled 2018-09-13 (×2): qty 2

## 2018-09-13 MED ORDER — METOCLOPRAMIDE HCL 5 MG/ML IJ SOLN
10.0000 mg | Freq: Once | INTRAMUSCULAR | Status: AC
  Administered 2018-09-13: 10 mg via INTRAVENOUS
  Filled 2018-09-13: qty 2

## 2018-09-13 MED ORDER — HEPARIN SODIUM (PORCINE) 5000 UNIT/ML IJ SOLN
5000.0000 [IU] | Freq: Three times a day (TID) | INTRAMUSCULAR | Status: DC
  Administered 2018-09-14 – 2018-09-15 (×4): 5000 [IU] via SUBCUTANEOUS
  Filled 2018-09-13 (×4): qty 1

## 2018-09-13 MED ORDER — ONDANSETRON HCL 4 MG/2ML IJ SOLN
4.0000 mg | Freq: Once | INTRAMUSCULAR | Status: AC | PRN
  Administered 2018-09-13: 4 mg via INTRAVENOUS
  Filled 2018-09-13: qty 2

## 2018-09-13 MED ORDER — BUPROPION HCL 75 MG PO TABS
75.0000 mg | ORAL_TABLET | Freq: Every morning | ORAL | Status: DC
  Administered 2018-09-14 – 2018-09-15 (×2): 75 mg via ORAL
  Filled 2018-09-13 (×2): qty 1

## 2018-09-13 MED ORDER — TAMSULOSIN HCL 0.4 MG PO CAPS
0.4000 mg | ORAL_CAPSULE | Freq: Every day | ORAL | Status: DC
  Administered 2018-09-14 – 2018-09-15 (×2): 0.4 mg via ORAL
  Filled 2018-09-13 (×2): qty 1

## 2018-09-13 MED ORDER — ACETAMINOPHEN 325 MG PO TABS: 650.0000 mg | ORAL_TABLET | Freq: Four times a day (QID) | ORAL | Status: DC | PRN

## 2018-09-13 MED ORDER — METOCLOPRAMIDE HCL 5 MG/ML IJ SOLN
5.0000 mg | Freq: Four times a day (QID) | INTRAMUSCULAR | Status: DC
  Administered 2018-09-14 (×2): 5 mg via INTRAVENOUS
  Filled 2018-09-13 (×2): qty 2

## 2018-09-13 MED ORDER — ONDANSETRON HCL 4 MG/2ML IJ SOLN
4.0000 mg | Freq: Four times a day (QID) | INTRAMUSCULAR | Status: DC | PRN
  Administered 2018-09-14: 4 mg via INTRAVENOUS
  Filled 2018-09-13: qty 2

## 2018-09-13 MED ORDER — TORSEMIDE 100 MG PO TABS
100.0000 mg | ORAL_TABLET | Freq: Two times a day (BID) | ORAL | Status: DC
  Filled 2018-09-13: qty 1

## 2018-09-13 NOTE — ED Provider Notes (Signed)
[facility] Emergency Department Provider Note    First MD Initiated Contact with Patient 09/13/18 1818     (approximate)  I have reviewed the triage vital signs and the nursing notes.   HISTORY  Chief Complaint Nausea    HPI [name]. is a 48 y.o. male extensive past medical history as listed below not on dialysis presents the ER for nausea decreased oral intake and vomiting.  Patient has a history of gastroparesis and has been able to keep anything down.  Feels very weak and dehydrated.  Not having any acute abdominal pain at this time.  This is noticed decreased urine output and has noticed increased swelling in his legs.    Past Medical History:  Diagnosis Date  . Anxiety   . Cellulitis   . CHF (congestive heart failure) ([name])   . Chronic diarrhea   . Chronic kidney disease   . Chronic pain of both ankles   . Diabetes mellitus without complication ([name])   . Diabetes mellitus, type II ([name])   . Diabetic neuropathy ([name])   . Frequent falls   . Gait instability   . Heart palpitations   . HLD (hyperlipidemia)   . Hypertension   . Insomnia   . Nausea and vomiting in adult    recurrent   Family History  Problem Relation Age of Onset  . CAD Father   . Stroke Father   . Diabetes Mellitus II Mother   . Kidney failure Mother   . Schizophrenia Mother    Past Surgical History:  Procedure Laterality Date  . AMPUTATION Left 03/16/2016   Procedure: AMPUTATION DIGIT LEFT HALLUX;  Surgeon: Trula Slade, DPM;  Location: [name];  Service: Podiatry;  Laterality: Left;  can start around 5   . ARTHROSCOPIC REPAIR ACL Left   . COLONOSCOPY WITH PROPOFOL N/A 10/28/2015   Procedure: COLONOSCOPY WITH PROPOFOL;  Surgeon: Lollie Sails, MD;  Location: [name] ENDOSCOPY;  Service: Endoscopy;  Laterality: N/A;  . COLONOSCOPY WITH PROPOFOL N/A 10/29/2015   Procedure: COLONOSCOPY WITH PROPOFOL;  Surgeon: Lollie Sails, MD;  Location[name] ENDOSCOPY;   Service: Endoscopy;  Laterality: N/A;  . ESOPHAGOGASTRODUODENOSCOPY (EGD) WITH PROPOFOL N/A 12/27/2017   Procedure: ESOPHAGOGASTRODUODENOSCOPY (EGD) WITH PROPOFOL;  Surgeon: [place], Benay Pike, MD;  Location: [name] ENDOSCOPY;  Service: Gastroenterology;  Laterality: N/A;  . PROSTATE SURGERY  2016  . TONSILECTOMY/ADENOIDECTOMY WITH MYRINGOTOMY    . TONSILLECTOMY     Patient Active Problem List   Diagnosis Date Noted  . Lymphedema 01/09/2018  . Chronic diastolic heart failure ([name]) 12/13/2017  . Vomiting 11/28/2017  . Acute on chronic heart failure ([name]) 11/23/2017  . Diabetes ([name]) 11/23/2017  . Diabetic retinopathy ([name]) 07/05/2017  . Uncontrolled diabetes mellitus ([name]) 07/08/2016  . Uncontrolled type 2 diabetes mellitus with hyperglycemia, with long-term current use of insulin ([name]) 04/20/2016  . Osteomyelitis of toe of left foot ([name]) 03/15/2016  . AKI (acute kidney injury) ([name]) 06/23/2015  . Diarrhea 06/23/2015  . HLD (hyperlipidemia)   . Essential hypertension   . Heart palpitations 05/06/2015      Prior to Admission medications   Medication Sig Start Date End Date Taking? Authorizing Provider  buPROPion (WELLBUTRIN) 75 MG tablet Take 1 tablet (75 mg total) by mouth every morning. 07/05/17   Ursula Alert, MD  calcitRIOL (ROCALTROL) 0.25 MCG capsule Take 1 capsule by mouth daily. 09/20/17   [provider]  calcium citrate-vitamin D (CITRACAL+D) 315-200 MG-UNIT tablet Take  2 tablets by mouth daily.     [provider]  carvedilol (COREG) 12.5 MG tablet Take 12.5 mg by mouth daily.  09/20/17   [provider]  cholestyramine light (PREVALITE) 4 g packet Take 4 g by mouth daily as needed (digestion).  11/17/15   [provider]  citalopram (CELEXA) 20 MG tablet Take 20 mg by mouth daily.  06/16/17   [provider]  dicyclomine (BENTYL) 20 MG tablet Take 1 tablet (20 mg total) by mouth 3 (three) times daily as needed (abdominal pain).  11/07/17   Nance Pear, MD  diltiazem (CARDIZEM) 120 MG tablet Take 120 mg by mouth daily.     [provider]  FEROSUL 325 (65 Fe) MG tablet Take 325 mg by mouth 2 (two) times daily with a meal.  09/07/17   [provider]  insulin aspart (NOVOLOG) 100 UNIT/ML injection Inject 0-6 Units into the skin 3 (three) times daily before meals. Per sliding scale    [provider]  insulin glargine (LANTUS) 100 UNIT/ML injection Inject 0.15 mLs (15 Units total) into the skin at bedtime. 11/10/17   Fritzi Mandes, MD  lisinopril (PRINIVIL,ZESTRIL) 20 MG tablet Take 20 mg by mouth 2 (two) times daily.  10/04/17   [provider]  metoCLOPramide (REGLAN) 10 MG tablet Take 1 tablet (10 mg total) by mouth every 8 (eight) hours as needed for nausea. 11/07/17 11/07/18  Nance Pear, MD  omeprazole (PRILOSEC) 40 MG capsule Take 40 mg by mouth 3 (three) times daily before meals. 12/27/17   [provider]  ondansetron (ZOFRAN-ODT) 4 MG disintegrating tablet Take 4 mg by mouth every 8 (eight) hours as needed for nausea or vomiting.  10/13/17   [provider]  potassium chloride SA (K-DUR,KLOR-CON) 20 MEQ tablet Take 2 tablets (40 mEq total) by mouth daily. 11/10/17   Fritzi Mandes, MD  rosuvastatin (CRESTOR) 40 MG tablet Take 40 mg by mouth daily.  05/26/17   [provider]  spironolactone (ALDACTONE) 25 MG tablet Take 1 tablet (25 mg total) by mouth daily. Started by Dr. Juleen China 07/26/2018 07/26/18 10/24/18  Alisa Graff, FNP  sucralfate (CARAFATE) 1 g tablet Take 1 g by mouth 4 (four) times daily. 12/14/17   [provider]  tamsulosin (FLOMAX) 0.4 MG CAPS capsule Take 0.4 mg by mouth daily after breakfast.     [provider]  torsemide (DEMADEX) 100 MG tablet Take 100 mg by mouth 2 (two) times daily. 12/27/17   [provider]    Allergies Levofloxacin, Phenergan [promethazine hcl], Malt, and Soy allergy    Social  History Social History   Tobacco Use  . Smoking status: Never Smoker  . Smokeless tobacco: Never Used  Substance Use Topics  . Alcohol use: No  . Drug use: No    Review of Systems Patient denies headaches, rhinorrhea, blurry vision, numbness, shortness of breath, chest pain, edema, cough, abdominal pain, nausea, vomiting, diarrhea, dysuria, fevers, rashes or hallucinations unless otherwise stated above in HPI. ____________________________________________   PHYSICAL EXAM:  VITAL SIGNS: Vitals:   09/13/18 1853 09/13/18 1902  BP: (!) 207/111 (!) 190/104  Pulse: 80   Resp: 18   Temp:    SpO2: 100%     Constitutional: Alert and oriented.  Eyes: Conjunctivae are normal.  Head: Atraumatic. Nose: No congestion/rhinnorhea. Mouth/Throat: Mucous membranes are moist.   Neck: No stridor. Painless ROM.  Cardiovascular: Normal rate, regular rhythm. Grossly normal heart sounds.  Good peripheral  circulation. Respiratory: Normal respiratory effort.  No retractions. Lungs CTAB. Gastrointestinal: Soft and nontender. No distention. No abdominal bruits. No CVA tenderness. Genitourinary:  Musculoskeletal: No lower extremity tenderness 2+ edema.  No joint effusions. Neurologic:  Normal speech and language. No gross focal neurologic deficits are appreciated. No facial droop Skin:  Skin is warm, dry and intact. No rash noted. Psychiatric: Mood and affect are normal. Speech and behavior are normal.  ____________________________________________   LABS (all labs ordered are listed, but only abnormal results are displayed)  Results for orders placed or performed during the hospital encounter of 09/13/18 (from the past 24 hour(s))  Lipase, blood     Status: None   Collection Time: 09/13/18  2:42 PM  Result Value Ref Range   Lipase 22 11 - 51 U/L  Comprehensive metabolic panel     Status: Abnormal   Collection Time: 09/13/18  2:42 PM  Result Value Ref Range   Sodium 142 135 - 145 mmol/L    Potassium 3.6 3.5 - 5.1 mmol/L   Chloride 108 98 - 111 mmol/L   CO2 25 22 - 32 mmol/L   Glucose, Bld 151 (H) 70 - 99 mg/dL   BUN 61 (H) 6 - 20 mg/dL   Creatinine, Ser 5.60 (H) 0.61 - 1.24 mg/dL   Calcium 8.1 (L) 8.9 - 10.3 mg/dL   Total Protein 5.7 (L) 6.5 - 8.1 g/dL   Albumin 2.4 (L) 3.5 - 5.0 g/dL   AST 14 (L) 15 - 41 U/L   ALT 17 0 - 44 U/L   Alkaline Phosphatase 51 38 - 126 U/L   Total Bilirubin 0.6 0.3 - 1.2 mg/dL   GFR calc non Af Amer 11 (L) >60 mL/min   GFR calc Af Amer 13 (L) >60 mL/min   Anion gap 9 5 - 15  CBC     Status: Abnormal   Collection Time: 09/13/18  2:42 PM  Result Value Ref Range   WBC 7.7 4.0 - 10.5 K/uL   RBC 3.82 (L) 4.22 - 5.81 MIL/uL   Hemoglobin 8.8 (L) 13.0 - 17.0 g/dL   HCT 28.3 (L) 39.0 - 52.0 %   MCV 74.1 (L) 80.0 - 100.0 fL   MCH 23.0 (L) 26.0 - 34.0 pg   MCHC 31.1 30.0 - 36.0 g/dL   RDW 13.9 11.5 - 15.5 %   Platelets 260 150 - 400 K/uL   nRBC 0.0 0.0 - 0.2 %   ____________________________________________ ____________________________________________  RADIOLOGY  I personally reviewed all radiographic images ordered to evaluate for the above acute complaints and reviewed radiology reports and findings.  These findings were personally discussed with the patient.  Please see medical record for radiology report.  ____________________________________________   PROCEDURES  Procedure(s) performed:  Procedures    Critical Care performed: no ____________________________________________   INITIAL IMPRESSION / ASSESSMENT AND PLAN / ED COURSE  Pertinent labs & imaging results that were available during my care of the patient were reviewed by me and considered in my medical decision making (see chart for details).   DDX: aki, renal failure, gastroparesis, colitis, ua  Eldred Edrei Norgaard. is a 48 y.o. who presents to the ED with was as described above.  Abdominal exam is soft benign but blood work showing evidence of acute on chronic kidney  disease with evidence of probable acute renal failure.  Electrolytes fortunately are normal.  He does appear dehydrated.  Will order chest x-ray to ensure there is no evidence of edema which would  point more towards cardiorenal syndrome acutely and would need Lasix.  This will further direct IV fluids or not.  Clinical Course as of Sep 13 1939  Wed Sep 13, 2018  1935 Patient clinically appears intravascularly depleted.  We will give gentle IV hydration.  Discussed case with Dr. Zollie Scale nephrology recommends 50 mL/h of normal saline overnight.  Is also requested ultrasound to evaluate for any obstructive pattern.  Repeat abdominal exam is soft and benign.  Will discuss with hospitalist for admission.   [PR]    Clinical Course User Index [PR] Merlyn Lot, MD    The patient was evaluated in Emergency Department today for the symptoms described in the history of present illness. He/she was evaluated in the context of the global COVID-19 pandemic, which necessitated consideration that the patient might be at risk for infection with the SARS-CoV-2 virus that causes COVID-19. Institutional protocols and algorithms that pertain to the evaluation of patients at risk for COVID-19 are in a state of rapid change based on information released by regulatory bodies including the CDC and federal and state organizations. These policies and algorithms were followed during the patient's care in the ED.  As part of my medical decision making, I reviewed the following data within the [facility] notes reviewed and incorporated, Labs reviewed, notes from prior ED visits and [place] Controlled Substance Database   ____________________________________________   FINAL CLINICAL IMPRESSION(S) / ED DIAGNOSES  Final diagnoses:  AKI (acute kidney injury) ([place])  Dehydration  Nausea      NEW MEDICATIONS STARTED DURING THIS VISIT:  New Prescriptions   No medications on file     Note:  This  document was prepared using Dragon voice recognition software and may include unintentional dictation errors.    Merlyn Lot, MD 09/13/18 [phone]

## 2018-09-13 NOTE — ED Notes (Signed)
ED TO INPATIENT HANDOFF REPORT  ED Nurse Name and Phone #:  Camille Thau/Jen [phone]  S Name/Age/Gender [name]. 48 y.o. male Room/Bed: ED12A/ED12A  Code Status   Code Status: Prior  Home/SNF/Other Home Patient oriented to: self, place, time and situation Is this baseline? Yes   Triage Complete: Triage complete  Chief Complaint poss dehydration  Triage Note Pt arrives with concerns over possible dehydration due to poor intake and nausea. Pt states he has a hx of gastroparesis and typically is sick on his stomach every day.   Allergies Allergies  Allergen Reactions  . Levofloxacin Swelling  . Phenergan [Promethazine Hcl] Other (See Comments)    Cramping   . Malt     Other reaction(s): OTHER  . Soy Allergy Other (See Comments)    Other reaction(s): OTHER    Level of Care/Admitting Diagnosis ED Disposition    ED Disposition Condition [facility]: [place] [[id number]]  Level of Care: Med-Surg [16]  Covid Evaluation: Confirmed COVID Negative  Diagnosis: Acute on chronic renal failure [facility]) [[id number]]  Admitting Physician: Lance Coon [[id number]]  Attending Physician: Lance Coon [phone]  Estimated length of stay: past midnight tomorrow  Certification:: I certify this patient will need inpatient services for at least 2 midnights  PT Class (Do Not Modify): Inpatient [101]  PT Acc Code (Do Not Modify): Private [1]       B Medical/Surgery History Past Medical History:  Diagnosis Date  . Anxiety   . Cellulitis   . CHF (congestive heart failure) ([place])   . Chronic diarrhea   . Chronic kidney disease   . Chronic pain of both ankles   . Diabetes mellitus without complication ([place])   . Diabetes mellitus, type II ([place])   . Diabetic neuropathy ([place])   . Frequent falls   . Gait instability   . Heart palpitations   . HLD (hyperlipidemia)   . Hypertension   . Insomnia   . Nausea and vomiting in adult    recurrent    Past Surgical History:  Procedure Laterality Date  . AMPUTATION Left 03/16/2016   Procedure: AMPUTATION DIGIT LEFT HALLUX;  Surgeon: Trula Slade, DPM;  Location: [facility];  Service: Podiatry;  Laterality: Left;  can start around 5   . ARTHROSCOPIC REPAIR ACL Left   . COLONOSCOPY WITH PROPOFOL N/A 10/28/2015   Procedure: COLONOSCOPY WITH PROPOFOL;  Surgeon: Lollie Sails, MD;  Location: [facility] ENDOSCOPY;  Service: Endoscopy;  Laterality: N/A;  . COLONOSCOPY WITH PROPOFOL N/A 10/29/2015   Procedure: COLONOSCOPY WITH PROPOFOL;  Surgeon: Lollie Sails, MD;  Location: [facility] ENDOSCOPY;  Service: Endoscopy;  Laterality: N/A;  . ESOPHAGOGASTRODUODENOSCOPY (EGD) WITH PROPOFOL N/A 12/27/2017   Procedure: ESOPHAGOGASTRODUODENOSCOPY (EGD) WITH PROPOFOL;  Surgeon: Toledo, Benay Pike, MD;  Location: [name] ENDOSCOPY;  Service: Gastroenterology;  Laterality: N/A;  . PROSTATE SURGERY  2016  . TONSILECTOMY/ADENOIDECTOMY WITH MYRINGOTOMY    . TONSILLECTOMY       A IV Location/Drains/Wounds Patient Lines/Drains/Airways Status   Active Line/Drains/Airways    Name:   Placement date:   Placement time:   Site:   Days:   Peripheral IV 09/13/18 Right Arm   09/13/18    1441    Arm   less than 1          Intake/Output Last 24 hours  Intake/Output Summary (Last 24 hours) at 09/13/2018 2257 Last data filed at 09/13/2018 1930 Gross per 24 hour  Intake 250 ml  Output -  Net 250 ml    Labs/Imaging Results for orders placed or performed during the hospital encounter of 09/13/18 (from the past 48 hour(s))  Lipase, blood     Status: None   Collection Time: 09/13/18  2:42 PM  Result Value Ref Range   Lipase 22 11 - 51 U/L    Comment: Performed at [facility], [address] [place], [place] [address]  Comprehensive metabolic panel     Status: Abnormal   Collection Time: 09/13/18  2:42 PM  Result Value Ref Range   Sodium 142 135 - 145 mmol/L   Potassium 3.6 3.5 - 5.1 mmol/L   Chloride 108 98  - 111 mmol/L   CO2 25 22 - 32 mmol/L   Glucose, Bld 151 (H) 70 - 99 mg/dL   BUN 61 (H) 6 - 20 mg/dL   Creatinine, Ser 5.60 (H) 0.61 - 1.24 mg/dL   Calcium 8.1 (L) 8.9 - 10.3 mg/dL   Total Protein 5.7 (L) 6.5 - 8.1 g/dL   Albumin 2.4 (L) 3.5 - 5.0 g/dL   AST 14 (L) 15 - 41 U/L   ALT 17 0 - 44 U/L   Alkaline Phosphatase 51 38 - 126 U/L   Total Bilirubin 0.6 0.3 - 1.2 mg/dL   GFR calc non Af Amer 11 (L) >60 mL/min   GFR calc Af Amer 13 (L) >60 mL/min   Anion gap 9 5 - 15    Comment: Performed at [facility] [address] [place], [place] [address]  CBC     Status: Abnormal   Collection Time: 09/13/18  2:42 PM  Result Value Ref Range   WBC 7.7 4.0 - 10.5 K/uL   RBC 3.82 (L) 4.22 - 5.81 MIL/uL   Hemoglobin 8.8 (L) 13.0 - 17.0 g/dL    Comment: Reticulocyte Hemoglobin testing may be clinically indicated, consider ordering this additional test [id number]    HCT 28.3 (L) 39.0 - 52.0 %   MCV 74.1 (L) 80.0 - 100.0 fL   MCH 23.0 (L) 26.0 - 34.0 pg   MCHC 31.1 30.0 - 36.0 g/dL   RDW 13.9 11.5 - 15.5 %   Platelets 260 150 - 400 K/uL   nRBC 0.0 0.0 - 0.2 %    Comment: Performed at [facility], [address] [place], [place] [address]  SARS Coronavirus 2 (CEPHEID- Performed in [place] hospital lab), Hosp Order     Status: None   Collection Time: 09/13/18  6:36 PM   Specimen: Nasopharyngeal Swab  Result Value Ref Range   SARS Coronavirus 2 NEGATIVE NEGATIVE    Comment: (NOTE) If result is NEGATIVE SARS-CoV-2 target nucleic acids are NOT DETECTED. The SARS-CoV-2 RNA is generally detectable in upper and lower  respiratory specimens during the acute phase of infection. The lowest  concentration of SARS-CoV-2 viral copies this assay can detect is 250  copies / mL. A negative result does not preclude SARS-CoV-2 infection  and should not be used as the sole basis for treatment or other  patient management decisions.  A negative result may occur with  improper  specimen collection / handling, submission of specimen other  than nasopharyngeal swab, presence of viral mutation(s) within the  areas targeted by this assay, and inadequate number of viral copies  (<250 copies / mL). A negative result must be combined with clinical  observations, patient history, and epidemiological information. If result is POSITIVE SARS-CoV-2 target nucleic acids are DETECTED. The  SARS-CoV-2 RNA is generally detectable in upper and lower  respiratory specimens dur ing the acute phase of infection.  Positive  results are indicative of active infection with SARS-CoV-2.  Clinical  correlation with patient history and other diagnostic information is  necessary to determine patient infection status.  Positive results do  not rule out bacterial infection or co-infection with other viruses. If result is PRESUMPTIVE POSTIVE SARS-CoV-2 nucleic acids MAY BE PRESENT.   A presumptive positive result was obtained on the submitted specimen  and confirmed on repeat testing.  While 2019 novel coronavirus  (SARS-CoV-2) nucleic acids may be present in the submitted sample  additional confirmatory testing may be necessary for epidemiological  and / or clinical management purposes  to differentiate between  SARS-CoV-2 and other Sarbecovirus currently known to infect humans.  If clinically indicated additional testing with an alternate test  methodology [phone]) is advised. The SARS-CoV-2 RNA is generally  detectable in upper and lower respiratory sp ecimens during the acute  phase of infection. The expected result is Negative. Fact Sheet for Patients:  StrictlyIdeas.no Fact Sheet for Healthcare Providers: BankingDealers.co.za This test is not yet approved or cleared by the [place] FDA and has been authorized for detection and/or diagnosis of SARS-CoV-2 by FDA under an Emergency Use Authorization (EUA).  This EUA will remain in  effect (meaning this test can be used) for the duration of the COVID-19 declaration under Section 564(b)(1) of the Act, 21 U.S.C. section 360bbb-3(b)(1), unless the authorization is terminated or revoked sooner. Performed at [facility], [address], [place], [place] [address]    US Renal  Result Date: 09/13/2018 CLINICAL DATA:  48 year old male with acute renal insufficiency. EXAM: RENAL / URINARY TRACT ULTRASOUND COMPLETE COMPARISON:  CT of the abdomen pelvis dated 07/18/2017 FINDINGS: Right Kidney: Renal measurements: 14.1 x 5.9 x 4.9 cm = volume: 212 mL. There is diffuse increased renal parenchymal echogenicity. No hydronephrosis or shadowing stone. Left Kidney: Renal measurements: 12.6 x 6.9 x 5.4 cm = volume: 230 mL. There is diffuse increased renal parenchymal echogenicity. No hydronephrosis or shadowing stone. Bladder: There is slight trabeculated appearance of the bladder wall which may be related to chronic bladder outlet obstruction or chronic/fecal impaction. The prostate gland measures 3.9 x 3.2 x 3.6 cm. IMPRESSION: Echogenic kidneys may represent medical renal disease. Clinical correlation is recommended. No hydronephrosis or shadowing stone. Electronically Signed   By: Anner Crete M.D.   On: 09/13/2018 19:55   Dg Chest Portable 1 View  Result Date: 09/13/2018 CLINICAL DATA:  Dehydration, nausea, poor p.o. intake, CHF, type II diabetes mellitus, hypertension EXAM: PORTABLE CHEST 1 VIEW COMPARISON:  Portable exam 1856 hours compared to 12/17/2017 FINDINGS: Enlargement of cardiac silhouette. Mediastinal contours and pulmonary vascularity normal. Lungs clear. No infiltrate, pleural effusion or pneumothorax. Bones unremarkable. IMPRESSION: Enlargement of cardiac silhouette. No acute abnormalities. Electronically Signed   By: Lavonia Dana M.D.   On: 09/13/2018 19:23    Pending Labs Unresulted Labs (From admission, onward)    Start     Ordered   09/13/18 1437   Urinalysis, Complete w Microscopic  ONCE - STAT,   STAT     09/13/18 1436   Signed and Held  HIV antibody (Routine Testing)  Once,   R     Signed and Held   Signed and Held  CBC  (heparin)  Once,   R    Comments: Baseline for heparin therapy IF NOT ALREADY DRAWN.  Notify MD if  PLT < 100 K.    Signed and Held   Signed and Held  Creatinine, serum  (heparin)  Once,   R    Comments: Baseline for heparin therapy IF NOT ALREADY DRAWN.    Signed and Held   Signed and Held  Basic metabolic panel  Tomorrow morning,   R     Signed and Held   Signed and Held  CBC  Tomorrow morning,   R     Signed and Held          Vitals/Pain Today's Vitals   09/13/18 1902 09/13/18 1902 09/13/18 1930 09/13/18 2130  BP: (!) 190/104  (!) 171/98 (!) 178/89  Pulse:   79 79  Resp:   16 18  Temp:      TempSrc:      SpO2:   100% 100%  Weight:      Height:      PainSc:  0-No pain      Isolation Precautions No active isolations  Medications Medications  0.9 %  sodium chloride infusion (has no administration in time range)  ondansetron (ZOFRAN) injection 4 mg (4 mg Intravenous Given 09/13/18 1444)  sodium chloride 0.9 % bolus 250 mL (0 mLs Intravenous Stopped 09/13/18 1930)  metoCLOPramide (REGLAN) injection 10 mg (10 mg Intravenous Given 09/13/18 1856)  labetalol (NORMODYNE) injection 5 mg (5 mg Intravenous Given 09/13/18 1948)    Mobility walks Low fall risk   Focused Assessments  A&Ox4. Ambulatory with steady gait. No c/o n/v at this time.   R Recommendations: See Admitting Provider Note  Report given to:   Additional Notes:

## 2018-09-13 NOTE — ED Triage Notes (Signed)
Pt arrives with concerns over possible dehydration due to poor intake and nausea. Pt states he has a hx of gastroparesis and typically is sick on his stomach every day.

## 2018-09-13 NOTE — H&P (Signed)
[facility] [place] NAME: Ioan Landini    MR#:  [id number]  DATE OF BIRTH:  03/16/1970  DATE OF ADMISSION:  09/13/2018  PRIMARY CARE PHYSICIAN: [name], MD   REQUESTING/REFERRING PHYSICIAN: Quentin Cornwall, MD  CHIEF COMPLAINT:   Chief Complaint  Patient presents with  . Nausea    HISTORY OF PRESENT ILLNESS:  Rudra Hobbins  is a 48 y.o. male who presents with chief complaint as above.  Patient presents to the ED with a complaint of persistent nausea and vomiting.  He states that he has gastroparesis, and has had significant nausea and vomiting for some time now.  It got worse over the last 24 hours or so.  He does have some significant acute on chronic renal failure on evaluation here in the ED.  He also has persistent nausea with some vomiting here.  Hospitalist were called for admission  PAST MEDICAL HISTORY:   Past Medical History:  Diagnosis Date  . Anxiety   . Cellulitis   . CHF (congestive heart failure) ([place])   . Chronic diarrhea   . Chronic kidney disease   . Chronic pain of both ankles   . Diabetes mellitus without complication ([place])   . Diabetes mellitus, type II ([place])   . Diabetic neuropathy ([place])   . Frequent falls   . Gait instability   . Heart palpitations   . HLD (hyperlipidemia)   . Hypertension   . Insomnia   . Nausea and vomiting in adult    recurrent     PAST SURGICAL HISTORY:   Past Surgical History:  Procedure Laterality Date  . AMPUTATION Left 03/16/2016   Procedure: AMPUTATION DIGIT LEFT HALLUX;  Surgeon: Trula Slade, DPM;  Location: [facility];  Service: Podiatry;  Laterality: Left;  can start around 5   . ARTHROSCOPIC REPAIR ACL Left   . COLONOSCOPY WITH PROPOFOL N/A 10/28/2015   Procedure: COLONOSCOPY WITH PROPOFOL;  Surgeon: Lollie Sails, MD;  Location: [facility] ENDOSCOPY;  Service: Endoscopy;  Laterality: N/A;  . COLONOSCOPY WITH PROPOFOL N/A 10/29/2015   Procedure: COLONOSCOPY  WITH PROPOFOL;  Surgeon: Lollie Sails, MD;  Location: [facility] ENDOSCOPY;  Service: Endoscopy;  Laterality: N/A;  . ESOPHAGOGASTRODUODENOSCOPY (EGD) WITH PROPOFOL N/A 12/27/2017   Procedure: ESOPHAGOGASTRODUODENOSCOPY (EGD) WITH PROPOFOL;  Surgeon: Toledo, Benay Pike, MD;  Location: [name] ENDOSCOPY;  Service: Gastroenterology;  Laterality: N/A;  . PROSTATE SURGERY  2016  . TONSILECTOMY/ADENOIDECTOMY WITH MYRINGOTOMY    . TONSILLECTOMY       SOCIAL HISTORY:   Social History   Tobacco Use  . Smoking status: Never Smoker  . Smokeless tobacco: Never Used  Substance Use Topics  . Alcohol use: No     FAMILY HISTORY:   Family History  Problem Relation Age of Onset  . CAD Father   . Stroke Father   . Diabetes Mellitus II Mother   . Kidney failure Mother   . Schizophrenia Mother      DRUG ALLERGIES:   Allergies  Allergen Reactions  . Levofloxacin Swelling  . Phenergan [Promethazine Hcl] Other (See Comments)    Cramping   . Malt     Other reaction(s): OTHER  . Soy Allergy Other (See Comments)    Other reaction(s): OTHER    MEDICATIONS AT HOME:   Prior to Admission medications   Medication Sig Start Date End Date Taking? Authorizing Provider  buPROPion (WELLBUTRIN) 75 MG tablet Take 1 tablet (75 mg total) by mouth  every morning. 07/05/17  Yes Ursula Alert, MD  calcitRIOL (ROCALTROL) 0.25 MCG capsule Take 1 capsule by mouth daily. 09/20/17  Yes [provider]  carvedilol (COREG) 12.5 MG tablet Take 12.5 mg by mouth daily.  09/20/17  Yes [provider]  citalopram (CELEXA) 20 MG tablet Take 20 mg by mouth daily.  06/16/17  Yes [provider]  FEROSUL 325 (65 Fe) MG tablet Take 325 mg by mouth 2 (two) times daily with a meal.  09/07/17  Yes [provider]  insulin aspart (NOVOLOG) 100 UNIT/ML injection Inject 0-6 Units into the skin 3 (three) times daily before meals. Per sliding scale   Yes [provider]  insulin glargine  (LANTUS) 100 UNIT/ML injection Inject 0.15 mLs (15 Units total) into the skin at bedtime. 11/10/17  Yes Fritzi Mandes, MD  lisinopril (PRINIVIL,ZESTRIL) 20 MG tablet Take 20 mg by mouth 2 (two) times daily.  10/04/17  Yes [provider]  omeprazole (PRILOSEC) 40 MG capsule Take 80 mg by mouth 2 (two) times a day.  12/27/17  Yes [provider]  potassium chloride SA (K-DUR,KLOR-CON) 20 MEQ tablet Take 2 tablets (40 mEq total) by mouth daily. 11/10/17  Yes Fritzi Mandes, MD  rosuvastatin (CRESTOR) 40 MG tablet Take 40 mg by mouth daily.  05/26/17  Yes [provider]  spironolactone (ALDACTONE) 25 MG tablet Take 1 tablet (25 mg total) by mouth daily. Started by Dr. Juleen China 07/26/2018 07/26/18 10/24/18 Yes Alisa Graff, FNP  tamsulosin (FLOMAX) 0.4 MG CAPS capsule Take 0.4 mg by mouth daily after breakfast.    Yes [provider]  torsemide (DEMADEX) 100 MG tablet Take 100 mg by mouth 2 (two) times daily. 12/27/17  Yes [provider]  calcium citrate-vitamin D (CITRACAL+D) 315-200 MG-UNIT tablet Take 2 tablets by mouth daily.     [provider]  cholestyramine light (PREVALITE) 4 g packet Take 4 g by mouth daily as needed (digestion).  11/17/15   [provider]  dicyclomine (BENTYL) 20 MG tablet Take 1 tablet (20 mg total) by mouth 3 (three) times daily as needed (abdominal pain). Patient not taking: Reported on 09/13/2018 11/07/17   Nance Pear, MD  diltiazem (CARDIZEM) 120 MG tablet Take 120 mg by mouth daily.     [provider]  fluticasone (FLONASE) 50 MCG/ACT nasal spray Place 1 spray into both nostrils daily. 04/01/18   [provider]  metoCLOPramide (REGLAN) 10 MG tablet Take 1 tablet (10 mg total) by mouth every 8 (eight) hours as needed for nausea. 11/07/17 11/07/18  Nance Pear, MD  ondansetron (ZOFRAN-ODT) 4 MG disintegrating tablet Take 4 mg by mouth every 8 (eight) hours as needed for nausea or vomiting.  10/13/17    [provider]  sucralfate (CARAFATE) 1 g tablet Take 1 g by mouth 4 (four) times daily. 12/14/17   [provider]    REVIEW OF SYSTEMS:  Review of Systems  Constitutional: Negative for chills, fever, malaise/fatigue and weight loss.  HENT: Negative for ear pain, hearing loss and tinnitus.   Eyes: Negative for blurred vision, double vision, pain and redness.  Respiratory: Negative for cough, hemoptysis and shortness of breath.   Cardiovascular: Negative for chest pain, palpitations, orthopnea and leg swelling.  Gastrointestinal: Positive for abdominal pain, nausea and vomiting. Negative for constipation and diarrhea.  Genitourinary: Negative for dysuria, frequency and hematuria.  Musculoskeletal: Negative for back pain, joint pain and neck pain.  Skin:  No acne, rash, or lesions  Neurological: Negative for dizziness, tremors, focal weakness and weakness.  Endo/Heme/Allergies: Negative for polydipsia. Does not bruise/bleed easily.  Psychiatric/Behavioral: Negative for depression. The patient is not nervous/anxious and does not have insomnia.      VITAL SIGNS:   Vitals:   09/13/18 1436 09/13/18 1853 09/13/18 1902 09/13/18 1930  BP:  (!) 207/111 (!) 190/104 (!) 171/98  Pulse:  80  79  Resp:  18  16  Temp:      TempSrc:      SpO2:  100%  100%  Weight: 108.9 kg     Height: 6\' 3"  (1.905 m)      Wt Readings from Last 3 Encounters:  09/13/18 108.9 kg  03/27/18 102.7 kg  01/12/18 105.8 kg    PHYSICAL EXAMINATION:  Physical Exam  Vitals reviewed. Constitutional: He is oriented to person, place, and time. He appears well-developed and well-nourished. No distress.  HENT:  Head: Normocephalic and atraumatic.  Dry mucous membranes  Eyes: Pupils are equal, round, and reactive to light. Conjunctivae and EOM are normal. No scleral icterus.  Neck: Normal range of motion. Neck supple. No JVD present. No thyromegaly present.  Cardiovascular: Normal rate, regular  rhythm and intact distal pulses. Exam reveals no gallop and no friction rub.  No murmur heard. Respiratory: Effort normal and breath sounds normal. No respiratory distress. He has no wheezes. He has no rales.  GI: Soft. Bowel sounds are normal. He exhibits no distension. There is no abdominal tenderness.  Musculoskeletal: Normal range of motion.        General: No edema.     Comments: No arthritis, no gout  Lymphadenopathy:    He has no cervical adenopathy.  Neurological: He is alert and oriented to person, place, and time. No cranial nerve deficit.  No dysarthria, no aphasia  Skin: Skin is warm and dry. No rash noted. No erythema.  Psychiatric: He has a normal mood and affect. His behavior is normal. Judgment and thought content normal.    LABORATORY PANEL:   CBC Recent Labs  Lab 09/13/18 1442  WBC 7.7  HGB 8.8*  HCT 28.3*  PLT 260   ------------------------------------------------------------------------------------------------------------------  Chemistries  Recent Labs  Lab 09/13/18 1442  NA 142  K 3.6  CL 108  CO2 25  GLUCOSE 151*  BUN 61*  CREATININE 5.60*  CALCIUM 8.1*  AST 14*  ALT 17  ALKPHOS 51  BILITOT 0.6   ------------------------------------------------------------------------------------------------------------------  Cardiac Enzymes No results for input(s): TROPONINI in the last 168 hours. ------------------------------------------------------------------------------------------------------------------  RADIOLOGY:  US Renal  Result Date: 09/13/2018 CLINICAL DATA:  49 year old male with acute renal insufficiency. EXAM: RENAL / URINARY TRACT ULTRASOUND COMPLETE COMPARISON:  CT of the abdomen pelvis dated 07/18/2017 FINDINGS: Right Kidney: Renal measurements: 14.1 x 5.9 x 4.9 cm = volume: 212 mL. There is diffuse increased renal parenchymal echogenicity. No hydronephrosis or shadowing stone. Left Kidney: Renal measurements: 12.6 x 6.9 x 5.4 cm =  volume: 230 mL. There is diffuse increased renal parenchymal echogenicity. No hydronephrosis or shadowing stone. Bladder: There is slight trabeculated appearance of the bladder wall which may be related to chronic bladder outlet obstruction or chronic/fecal impaction. The prostate gland measures 3.9 x 3.2 x 3.6 cm. IMPRESSION: Echogenic kidneys may represent medical renal disease. Clinical correlation is recommended. No hydronephrosis or shadowing stone. Electronically Signed   By: Anner Crete M.D.   On: 09/13/2018 19:55   Dg Chest Portable 1 View  Result Date: 09/13/2018  CLINICAL DATA:  Dehydration, nausea, poor p.o. intake, CHF, type II diabetes mellitus, hypertension EXAM: PORTABLE CHEST 1 VIEW COMPARISON:  Portable exam 1856 hours compared to 12/17/2017 FINDINGS: Enlargement of cardiac silhouette. Mediastinal contours and pulmonary vascularity normal. Lungs clear. No infiltrate, pleural effusion or pneumothorax. Bones unremarkable. IMPRESSION: Enlargement of cardiac silhouette. No acute abnormalities. Electronically Signed   By: Lavonia Dana M.D.   On: 09/13/2018 19:23    EKG:   Orders placed or performed during the hospital encounter of 12/17/17  . EKG 12-Lead  . EKG 12-Lead  . ED EKG within 10 minutes  . ED EKG within 10 minutes  . EKG    IMPRESSION AND PLAN:  Principal Problem:   Acute on chronic renal failure (HCC) -hydrate gently with IV fluids tonight.  If his renal function is not improving quickly enough, consider nephrology consult Active Problems:   Essential hypertension -home dose antihypertensives   Uncontrolled type 2 diabetes mellitus with hyperglycemia, with long-term current use of insulin (HCC) -sliding scale insulin coverage   Chronic diastolic heart failure ([place]) -continue home meds, cautious fluid administration   HLD (hyperlipidemia) -home dose antilipid  Chart review performed and case discussed with ED provider. Labs, imaging and/or ECG reviewed by provider  and discussed with patient/family. Management plans discussed with the patient and/or family.  COVID-19 status: Tested negative     DVT PROPHYLAXIS: SubQ heparin  GI PROPHYLAXIS:  PPI at home dose   ADMISSION STATUS: Inpatient     CODE STATUS: Full Code Status History    Date Active Date Inactive Code Status Order ID Comments User Context   11/08/2017 1610 11/10/2017 1511 Full Code [id number]  Henreitta Leber, MD Inpatient   07/18/2017 2120 07/19/2017 1825 Full Code [id number]  Gorden Harms, MD Inpatient   08/18/2016 2314 08/19/2016 1901 Full Code [id number]  Vaughan Basta, MD Inpatient   07/09/2016 0141 07/09/2016 1759 Full Code [id number]  Vaughan Basta, MD ED   03/15/2016 2207 03/17/2016 1753 Full Code [id number]  Etta Quill, DO ED   06/23/2015 2139 06/28/2015 1733 Full Code [id number]  Ivor Costa, MD ED   03/07/2015 0133 03/10/2015 2107 Full Code [id number]  Harrie Foreman, MD Inpatient   Advance Care Planning Activity      TOTAL TIME TAKING CARE OF THIS PATIENT: 45 minutes.   This patient was evaluated in the context of the global COVID-19 pandemic, which necessitated consideration that the patient might be at risk for infection with the SARS-CoV-2 virus that causes COVID-19. Institutional protocols and algorithms that pertain to the evaluation of patients at risk for COVID-19 are in a state of rapid change based on information released by regulatory bodies including the CDC and federal and state organizations. These policies and algorithms were followed to the best of this provider's knowledge to date during the patient's care at this facility.  Ethlyn Daniels 09/13/2018, 9:12 PM  [facility]  Office  [phone]  CC: Primary care physician; [name], MD  Note:  This document was prepared using Dragon voice recognition software and may include unintentional dictation errors.

## 2018-09-14 ENCOUNTER — Other Ambulatory Visit: Payer: Self-pay

## 2018-09-14 LAB — CBC
HCT: 29.6 % — ABNORMAL LOW (ref 39.0–52.0)
Hemoglobin: 9.2 g/dL — ABNORMAL LOW (ref 13.0–17.0)
MCH: 22.8 pg — ABNORMAL LOW (ref 26.0–34.0)
MCHC: 31.1 g/dL (ref 30.0–36.0)
MCV: 73.3 fL — ABNORMAL LOW (ref 80.0–100.0)
Platelets: 275 10*3/uL (ref 150–400)
RBC: 4.04 MIL/uL — ABNORMAL LOW (ref 4.22–5.81)
RDW: 13.8 % (ref 11.5–15.5)
WBC: 9.6 10*3/uL (ref 4.0–10.5)
nRBC: 0 % (ref 0.0–0.2)

## 2018-09-14 LAB — BASIC METABOLIC PANEL
Anion gap: 9 (ref 5–15)
BUN: 60 mg/dL — ABNORMAL HIGH (ref 6–20)
CO2: 25 mmol/L (ref 22–32)
Calcium: 8.2 mg/dL — ABNORMAL LOW (ref 8.9–10.3)
Chloride: 112 mmol/L — ABNORMAL HIGH (ref 98–111)
Creatinine, Ser: 5.26 mg/dL — ABNORMAL HIGH (ref 0.61–1.24)
GFR calc Af Amer: 14 mL/min — ABNORMAL LOW (ref >60)
GFR calc non Af Amer: 12 mL/min — ABNORMAL LOW (ref >60)
Glucose, Bld: 115 mg/dL — ABNORMAL HIGH (ref 70–99)
Potassium: 3.4 mmol/L — ABNORMAL LOW (ref 3.5–5.1)
Sodium: 146 mmol/L — ABNORMAL HIGH (ref 135–145)

## 2018-09-14 LAB — GLUCOSE, CAPILLARY
Glucose-Capillary: 104 mg/dL — ABNORMAL HIGH (ref 70–99)
Glucose-Capillary: 106 mg/dL — ABNORMAL HIGH (ref 70–99)
Glucose-Capillary: 134 mg/dL — ABNORMAL HIGH (ref 70–99)

## 2018-09-14 MED ORDER — SODIUM CHLORIDE 0.9 % IV SOLN: INTRAVENOUS | Status: AC

## 2018-09-14 MED ORDER — METOCLOPRAMIDE HCL 5 MG PO TABS
5.0000 mg | ORAL_TABLET | Freq: Three times a day (TID) | ORAL | Status: DC
  Administered 2018-09-14 – 2018-09-15 (×3): 5 mg via ORAL
  Filled 2018-09-14 (×3): qty 1

## 2018-09-14 MED ORDER — HYDRALAZINE HCL 20 MG/ML IJ SOLN
10.0000 mg | INTRAMUSCULAR | Status: DC | PRN
  Administered 2018-09-14 (×3): 10 mg via INTRAVENOUS
  Filled 2018-09-14 (×3): qty 1

## 2018-09-14 MED ORDER — PANTOPRAZOLE SODIUM 40 MG PO TBEC
40.0000 mg | DELAYED_RELEASE_TABLET | Freq: Two times a day (BID) | ORAL | Status: DC
  Administered 2018-09-15: 40 mg via ORAL
  Filled 2018-09-14: qty 1

## 2018-09-14 MED ORDER — SODIUM CHLORIDE 0.9 % IV SOLN
INTRAVENOUS | Status: AC
  Administered 2018-09-14: 18:00:00 via INTRAVENOUS

## 2018-09-14 MED ORDER — LABETALOL HCL 5 MG/ML IV SOLN
10.0000 mg | INTRAVENOUS | Status: DC | PRN
  Administered 2018-09-14 – 2018-09-15 (×2): 10 mg via INTRAVENOUS
  Filled 2018-09-14 (×2): qty 4

## 2018-09-14 MED ORDER — TORSEMIDE 100 MG PO TABS: 100.0000 mg | ORAL_TABLET | Freq: Two times a day (BID) | ORAL | Status: DC

## 2018-09-14 MED ORDER — POTASSIUM CHLORIDE CRYS ER 20 MEQ PO TBCR
40.0000 meq | EXTENDED_RELEASE_TABLET | Freq: Every day | ORAL | Status: DC
  Administered 2018-09-14 – 2018-09-15 (×2): 40 meq via ORAL
  Filled 2018-09-14 (×2): qty 2

## 2018-09-14 MED ORDER — SPIRONOLACTONE 25 MG PO TABS
25.0000 mg | ORAL_TABLET | Freq: Every day | ORAL | Status: DC
  Administered 2018-09-14 – 2018-09-15 (×2): 25 mg via ORAL
  Filled 2018-09-14 (×2): qty 1

## 2018-09-14 NOTE — Progress Notes (Signed)
[facility] [place] NAME: [name]    MR#:  [id number]  DATE OF BIRTH:  04-15-1970  SUBJECTIVE:   Came in with nausea vomiting not able to keep anything orally. Currently very hungry wants something to eat.  He has history of chronic kidney disease stage 3/4.   REVIEW OF SYSTEMS:   Review of Systems  Constitutional: Negative for chills, fever and weight loss.  HENT: Negative for ear discharge, ear pain and nosebleeds.   Eyes: Negative for blurred vision, pain and discharge.  Respiratory: Negative for sputum production, shortness of breath, wheezing and stridor.   Cardiovascular: Negative for chest pain, palpitations, orthopnea and PND.  Gastrointestinal: Positive for nausea. Negative for abdominal pain, diarrhea and vomiting.  Genitourinary: Negative for frequency and urgency.  Musculoskeletal: Negative for back pain and joint pain.  Neurological: Negative for sensory change, speech change and focal weakness.  Psychiatric/Behavioral: Negative for depression and hallucinations. The patient is not nervous/anxious.    Tolerating Diet: yes Tolerating PT: ambulatory  DRUG ALLERGIES:   Allergies  Allergen Reactions  . Levofloxacin Swelling  . Phenergan [Promethazine Hcl] Other (See Comments)    Cramping   . Malt     Other reaction(s): OTHER  . Soy Allergy Other (See Comments)    Other reaction(s): OTHER    VITALS:  Blood pressure (!) 161/84, pulse 83, temperature 98.3 F (36.8 C), temperature source Oral, resp. rate 17, height 6\' 3"  (1.905 m), weight 94.5 kg, SpO2 99 %.  PHYSICAL EXAMINATION:   Physical Exam  GENERAL:  48 y.o.-year-old patient lying in the bed with no acute distress.  EYES: Pupils equal, round, reactive to light and accommodation. No scleral icterus. Extraocular muscles intact.  HEENT: Head atraumatic, normocephalic. Oropharynx and nasopharynx clear.  NECK:  Supple, no jugular venous distention.  No thyroid enlargement, no tenderness.  LUNGS: Normal breath sounds bilaterally, no wheezing, rales, rhonchi. No use of accessory muscles of respiration.  CARDIOVASCULAR: S1, S2 normal. No murmurs, rubs, or gallops.  ABDOMEN: Soft, nontender, nondistended. Bowel sounds present. No organomegaly or mass.  EXTREMITIES: No cyanosis, clubbing or edema b/l.    NEUROLOGIC: Cranial nerves II through XII are intact. No focal Motor or sensory deficits b/l.   PSYCHIATRIC:  patient is alert and oriented x 3.  SKIN: No obvious rash, lesion, or ulcer.   LABORATORY PANEL:  CBC Recent Labs  Lab 09/14/18 0418  WBC 9.6  HGB 9.2*  HCT 29.6*  PLT 275    Chemistries  Recent Labs  Lab 09/13/18 1442 09/14/18 0418  NA 142 146*  K 3.6 3.4*  CL 108 112*  CO2 25 25  GLUCOSE 151* 115*  BUN 61* 60*  CREATININE 5.60* 5.26*  CALCIUM 8.1* 8.2*  AST 14*  --   ALT 17  --   ALKPHOS 51  --   BILITOT 0.6  --    Cardiac Enzymes No results for input(s): TROPONINI in the last 168 hours. RADIOLOGY:  US Renal  Result Date: 09/13/2018 CLINICAL DATA:  47 year old male with acute renal insufficiency. EXAM: RENAL / URINARY TRACT ULTRASOUND COMPLETE COMPARISON:  CT of the abdomen pelvis dated 07/18/2017 FINDINGS: Right Kidney: Renal measurements: 14.1 x 5.9 x 4.9 cm = volume: 212 mL. There is diffuse increased renal parenchymal echogenicity. No hydronephrosis or shadowing stone. Left Kidney: Renal measurements: 12.6 x 6.9 x 5.4 cm = volume: 230 mL. There is diffuse increased renal parenchymal echogenicity. No hydronephrosis or shadowing stone.  Bladder: There is slight trabeculated appearance of the bladder wall which may be related to chronic bladder outlet obstruction or chronic/fecal impaction. The prostate gland measures 3.9 x 3.2 x 3.6 cm. IMPRESSION: Echogenic kidneys may represent medical renal disease. Clinical correlation is recommended. No hydronephrosis or shadowing stone. Electronically Signed   By: Anner Crete M.D.   On: 09/13/2018 19:55   Dg Chest Portable 1 View  Result Date: 09/13/2018 CLINICAL DATA:  Dehydration, nausea, poor p.o. intake, CHF, type II diabetes mellitus, hypertension EXAM: PORTABLE CHEST 1 VIEW COMPARISON:  Portable exam 1856 hours compared to 12/17/2017 FINDINGS: Enlargement of cardiac silhouette. Mediastinal contours and pulmonary vascularity normal. Lungs clear. No infiltrate, pleural effusion or pneumothorax. Bones unremarkable. IMPRESSION: Enlargement of cardiac silhouette. No acute abnormalities. Electronically Signed   By: Lavonia Dana M.D.   On: 09/13/2018 19:23   ASSESSMENT AND PLAN:   [name]  is a 48 y.o. male who presents  to the ED with a complaint of persistent nausea and vomiting.  He states that he has gastroparesis, and has had significant nausea and vomiting for some time now.  * Acute on chronic renal failure Stage IV (HCC) due to dehydration from G.I. losses. Patient has history of gastroparesis diabetic - -hydrate gently with IV fluids   I - nephrology consult-- Dr. Zollie Scale informed. He will see patient tomorrow -baseline creatinine 3.5 came in with creatinine of 5.6--- 5.4 -start diet. Encouraged PO intake. -Continue IV fluids today -hold torsemide, lisinopril  *  Essential hypertension -home dose antihypertensives  *  Uncontrolled type 2 diabetes mellitus with hyperglycemia, with long-term current use of insulin (HCC) -sliding scale insulin coverage  *  Chronic diastolic heart failure ([place]) -continue home meds, cautious fluid administration  *  HLD (hyperlipidemia) -home dose antilipid  Case discussed with Care Management/Social Worker. Management plans discussed with the patient  CODE STATUS: full  DVT Prophylaxis: heparin  TOTAL TIME TAKING CARE OF THIS PATIENT: *30* minutes.  >50% time spent on counselling and coordination of care  POSSIBLE D/C IN **2-3DAYS, DEPENDING ON CLINICAL CONDITION.  Note: This dictation was  prepared with Dragon dictation along with smaller phrase technology. Any transcriptional errors that result from this process are unintentional.  Fritzi Mandes M.D on 09/14/2018 at 2:47 PM  Between 7am to 6pm - Pager - [phone]  After 6pm go to [email or web address] - password [email or web address]  Office  [phone]  CC: Primary care physician; [name], MDPatient ID: [name], male   DOB: 04-Jul-1970, 48 y.o.   MRN: [id number]

## 2018-09-14 NOTE — Progress Notes (Addendum)
Pts BP 184/100, HR 83. Pt asymptomatic. Gave scheduled oral BP meds and PRN 10 mg IV Hydralazine. Alerted MD.    Fuller Mandril, RN

## 2018-09-15 LAB — CBC
HCT: 29.6 % — ABNORMAL LOW (ref 39.0–52.0)
Hemoglobin: 9 g/dL — ABNORMAL LOW (ref 13.0–17.0)
MCH: 22.5 pg — ABNORMAL LOW (ref 26.0–34.0)
MCHC: 30.4 g/dL (ref 30.0–36.0)
MCV: 74 fL — ABNORMAL LOW (ref 80.0–100.0)
Platelets: 294 10*3/uL (ref 150–400)
RBC: 4 MIL/uL — ABNORMAL LOW (ref 4.22–5.81)
RDW: 13.7 % (ref 11.5–15.5)
WBC: 8.7 10*3/uL (ref 4.0–10.5)
nRBC: 0 % (ref 0.0–0.2)

## 2018-09-15 LAB — CREATININE, SERUM
Creatinine, Ser: 4.78 mg/dL — ABNORMAL HIGH (ref 0.61–1.24)
GFR calc Af Amer: 15 mL/min — ABNORMAL LOW (ref >60)
GFR calc non Af Amer: 13 mL/min — ABNORMAL LOW (ref >60)

## 2018-09-15 LAB — GLUCOSE, CAPILLARY
Glucose-Capillary: 121 mg/dL — ABNORMAL HIGH (ref 70–99)
Glucose-Capillary: 94 mg/dL (ref 70–99)

## 2018-09-15 LAB — HIV ANTIBODY (ROUTINE TESTING W REFLEX): HIV Screen 4th Generation wRfx: NONREACTIVE

## 2018-09-15 MED ORDER — INSULIN GLARGINE 100 UNIT/ML ~~LOC~~ SOLN
10.0000 [IU] | Freq: Every day | SUBCUTANEOUS | Status: DC
  Administered 2018-09-15: 10 [IU] via SUBCUTANEOUS
  Filled 2018-09-15 (×2): qty 0.1

## 2018-09-15 NOTE — Discharge Instructions (Signed)
Keep log of blood pressure at home

## 2018-09-15 NOTE — Care Management Important Message (Signed)
Important Message  Patient Details  Name: [name]. MRN: [id number] Date of Birth: August 05, 1970   Medicare Important Message Given:  Yes  Initial Medicare IM given by Patient Access Associate on 09/14/2018 at 10:22am.  Still valid.    Dannette Barbara 09/15/2018, 8:23 AM

## 2018-09-15 NOTE — Discharge Summary (Signed)
[facility] NAME: [name]    MR#:  [id number]  DATE OF BIRTH:  [birth year]  DATE OF ADMISSION:  09/13/2018 ADMITTING PHYSICIAN: Lance Coon, MD  DATE OF DISCHARGE: 09/15/2018  PRIMARY CARE PHYSICIAN: [name], MD    ADMISSION DIAGNOSIS:  Dehydration [E86.0] Nausea [R11.0] AKI (acute kidney injury) ([place]) [N17.9]  DISCHARGE DIAGNOSIS:  acute on chronic kidney disease stage IV secondary to G.I. losses/dehydration  SECONDARY DIAGNOSIS:   Past Medical History:  Diagnosis Date  . Anxiety   . Cellulitis   . CHF (congestive heart failure) ([place])   . Chronic diarrhea   . Chronic kidney disease   . Chronic pain of both ankles   . Diabetes mellitus without complication ([place])   . Diabetes mellitus, type II ([place])   . Diabetic neuropathy ([place])   . Frequent falls   . Gait instability   . Heart palpitations   . HLD (hyperlipidemia)   . Hypertension   . Insomnia   . Nausea and vomiting in adult    recurrent    HOSPITAL COURSE:   AlphonzaBellamyis a48 y.o.malewho presents  to the ED with a complaint of persistent nausea and vomiting. He states that he has gastroparesis, and has had significant nausea and vomiting for some time now.  * Acute on chronic renal failure Stage IV (HCC) due to dehydration from G.I. losses. Patient has history of gastroparesis diabetic - -hydrate gently with IV fluids   - nephrology consult-- Dr. Zollie Scale informed.  -baseline creatinine 3.5 came in with creatinine of 5.6--- 5.4--4.7 -start diet. Encouraged PO intake. -Received IV fluids. Patient appears well hydrated. -hold torsemide-- as outpatient by primary nephrologist Dr. Juleen China -resumed lisinopril  *Essential hypertension -home dose antihypertensives  *Uncontrolled type 2 diabetes mellitus with hyperglycemia, with long-term current use of insulin (HCC) -sliding scale insulin coverage  *Chronic diastolic  heart failure ([place]) -continue home meds, cautious fluid administration  *HLD (hyperlipidemia) -home dose antilipid  Patient overall feels a lot better. He wants to go home. I have asked him to follow-up with Dr. Juleen China next week.  CONSULTS OBTAINED:  Treatment Team:  Anthonette Legato, MD  DRUG ALLERGIES:   Allergies  Allergen Reactions  . Levofloxacin Swelling  . Phenergan [Promethazine Hcl] Other (See Comments)    Cramping   . Malt     Other reaction(s): OTHER  . Soy Allergy Other (See Comments)    Other reaction(s): OTHER    DISCHARGE MEDICATIONS:   Allergies as of 09/15/2018      Reactions   Levofloxacin Swelling   Phenergan [promethazine Hcl] Other (See Comments)   Cramping   Malt    Other reaction(s): OTHER   Soy Allergy Other (See Comments)   Other reaction(s): OTHER      Medication List    STOP taking these medications   dicyclomine 20 MG tablet Commonly known as: Bentyl   torsemide 100 MG tablet Commonly known as: DEMADEX     TAKE these medications   buPROPion 75 MG tablet Commonly known as: WELLBUTRIN Take 1 tablet (75 mg total) by mouth every morning.   calcitRIOL 0.25 MCG capsule Commonly known as: ROCALTROL Take 1 capsule by mouth daily.   calcium citrate-vitamin D 315-200 MG-UNIT tablet Commonly known as: CITRACAL+D Take 2 tablets by mouth daily.   carvedilol 12.5 MG tablet Commonly known as: COREG Take 12.5 mg by mouth daily.   cholestyramine light 4 g packet Commonly known as: PREVALITE Take  4 g by mouth daily as needed (digestion).   citalopram 20 MG tablet Commonly known as: CELEXA Take 20 mg by mouth daily.   diltiazem 120 MG tablet Commonly known as: CARDIZEM Take 120 mg by mouth daily.   FeroSul 325 (65 FE) MG tablet Generic drug: ferrous sulfate Take 325 mg by mouth 2 (two) times daily with a meal.   fluticasone 50 MCG/ACT nasal spray Commonly known as: FLONASE Place 1 spray into both nostrils daily.   insulin  aspart 100 UNIT/ML injection Commonly known as: novoLOG Inject 0-6 Units into the skin 3 (three) times daily before meals. Per sliding scale   insulin glargine 100 UNIT/ML injection Commonly known as: LANTUS Inject 0.15 mLs (15 Units total) into the skin at bedtime.   lisinopril 20 MG tablet Commonly known as: ZESTRIL Take 20 mg by mouth 2 (two) times daily.   metoCLOPramide 10 MG tablet Commonly known as: REGLAN Take 1 tablet (10 mg total) by mouth every 8 (eight) hours as needed for nausea.   omeprazole 40 MG capsule Commonly known as: PRILOSEC Take 80 mg by mouth 2 (two) times a day.   ondansetron 4 MG disintegrating tablet Commonly known as: ZOFRAN-ODT Take 4 mg by mouth every 8 (eight) hours as needed for nausea or vomiting.   potassium chloride SA 20 MEQ tablet Commonly known as: K-DUR Take 2 tablets (40 mEq total) by mouth daily.   rosuvastatin 40 MG tablet Commonly known as: CRESTOR Take 40 mg by mouth daily.   spironolactone 25 MG tablet Commonly known as: ALDACTONE Take 1 tablet (25 mg total) by mouth daily. Started by Dr. Juleen China 07/26/2018   sucralfate 1 g tablet Commonly known as: CARAFATE Take 1 g by mouth 4 (four) times daily.   tamsulosin 0.4 MG Caps capsule Commonly known as: FLOMAX Take 0.4 mg by mouth daily after breakfast.       If you experience worsening of your admission symptoms, develop shortness of breath, life threatening emergency, suicidal or homicidal thoughts you must seek medical attention immediately by calling 911 or calling your MD immediately  if symptoms less severe.  You Must read complete instructions/literature along with all the possible adverse reactions/side effects for all the Medicines you take and that have been prescribed to you. Take any new Medicines after you have completely understood and accept all the possible adverse reactions/side effects.   Please note  You were cared for by a hospitalist during your hospital  stay. If you have any questions about your discharge medications or the care you received while you were in the hospital after you are discharged, you can call the unit and asked to speak with the hospitalist on call if the hospitalist that took care of you is not available. Once you are discharged, your primary care physician will handle any further medical issues. Please note that NO REFILLS for any discharge medications will be authorized once you are discharged, as it is imperative that you return to your primary care physician (or establish a relationship with a primary care physician if you do not have one) for your aftercare needs so that they can reassess your need for medications and monitor your lab values. Today   SUBJECTIVE     VITAL SIGNS:  Blood pressure (!) 158/96, pulse 87, temperature 98.7 F (37.1 C), temperature source Oral, resp. rate 16, height 6\' 3"  (1.905 m), weight 94.5 kg, SpO2 100 %.  I/O:    Intake/Output Summary (Last 24 hours) at  09/15/2018 0853 Last data filed at 09/15/2018 0503 Gross per 24 hour  Intake 746.44 ml  Output 1575 ml  Net -828.56 ml    PHYSICAL EXAMINATION:  GENERAL:  48 y.o.-year-old patient lying in the bed with no acute distress.  EYES: Pupils equal, round, reactive to light and accommodation. No scleral icterus. Extraocular muscles intact.  HEENT: Head atraumatic, normocephalic. Oropharynx and nasopharynx clear.  NECK:  Supple, no jugular venous distention. No thyroid enlargement, no tenderness.  LUNGS: Normal breath sounds bilaterally, no wheezing, rales,rhonchi or crepitation. No use of accessory muscles of respiration.  CARDIOVASCULAR: S1, S2 normal. No murmurs, rubs, or gallops.  ABDOMEN: Soft, non-tender, non-distended. Bowel sounds present. No organomegaly or mass.  EXTREMITIES: No pedal edema, cyanosis, or clubbing.  NEUROLOGIC: Cranial nerves II through XII are intact. Muscle strength 5/5 in all extremities. Sensation intact. Gait  not checked.  PSYCHIATRIC: The patient is alert and oriented x 3.  SKIN: No obvious rash, lesion, or ulcer.   DATA REVIEW:   CBC  Recent Labs  Lab 09/15/18 0425  WBC 8.7  HGB 9.0*  HCT 29.6*  PLT 294    Chemistries  Recent Labs  Lab 09/13/18 1442 09/14/18 0418 09/15/18 0425  NA 142 146*  --   K 3.6 3.4*  --   CL 108 112*  --   CO2 25 25  --   GLUCOSE 151* 115*  --   BUN 61* 60*  --   CREATININE 5.60* 5.26* 4.78*  CALCIUM 8.1* 8.2*  --   AST 14*  --   --   ALT 17  --   --   ALKPHOS 51  --   --   BILITOT 0.6  --   --     Microbiology Results   Recent Results (from the past 240 hour(s))  SARS Coronavirus 2 (CEPHEID- Performed in [facility] hospital lab), Hosp Order     Status: None   Collection Time: 09/13/18  6:36 PM   Specimen: Nasopharyngeal Swab  Result Value Ref Range Status   SARS Coronavirus 2 NEGATIVE NEGATIVE Final    Comment: (NOTE) If result is NEGATIVE SARS-CoV-2 target nucleic acids are NOT DETECTED. The SARS-CoV-2 RNA is generally detectable in upper and lower  respiratory specimens during the acute phase of infection. The lowest  concentration of SARS-CoV-2 viral copies this assay can detect is 250  copies / mL. A negative result does not preclude SARS-CoV-2 infection  and should not be used as the sole basis for treatment or other  patient management decisions.  A negative result may occur with  improper specimen collection / handling, submission of specimen other  than nasopharyngeal swab, presence of viral mutation(s) within the  areas targeted by this assay, and inadequate number of viral copies  (<250 copies / mL). A negative result must be combined with clinical  observations, patient history, and epidemiological information. If result is POSITIVE SARS-CoV-2 target nucleic acids are DETECTED. The SARS-CoV-2 RNA is generally detectable in upper and lower  respiratory specimens dur ing the acute phase of infection.  Positive  results are  indicative of active infection with SARS-CoV-2.  Clinical  correlation with patient history and other diagnostic information is  necessary to determine patient infection status.  Positive results do  not rule out bacterial infection or co-infection with other viruses. If result is PRESUMPTIVE POSTIVE SARS-CoV-2 nucleic acids MAY BE PRESENT.   A presumptive positive result was obtained on the submitted specimen  and confirmed on  repeat testing.  While 2019 novel coronavirus  (SARS-CoV-2) nucleic acids may be present in the submitted sample  additional confirmatory testing may be necessary for epidemiological  and / or clinical management purposes  to differentiate between  SARS-CoV-2 and other Sarbecovirus currently known to infect humans.  If clinically indicated additional testing with an alternate test  methodology [id number]) is advised. The SARS-CoV-2 RNA is generally  detectable in upper and lower respiratory sp ecimens during the acute  phase of infection. The expected result is Negative. Fact Sheet for Patients:  StrictlyIdeas.no Fact Sheet for Healthcare Providers: BankingDealers.co.za This test is not yet approved or cleared by the [place] FDA and has been authorized for detection and/or diagnosis of SARS-CoV-2 by FDA under an Emergency Use Authorization (EUA).  This EUA will remain in effect (meaning this test can be used) for the duration of the COVID-19 declaration under Section 564(b)(1) of the Act, 21 U.S.C. section 360bbb-3(b)(1), unless the authorization is terminated or revoked sooner. Performed at [facility], [address] [place], [place] [address]     RADIOLOGY:  US Renal  Result Date: 09/13/2018 CLINICAL DATA:  48 year old male with acute renal insufficiency. EXAM: RENAL / URINARY TRACT ULTRASOUND COMPLETE COMPARISON:  CT of the abdomen pelvis dated 07/18/2017 FINDINGS: Right Kidney: Renal  measurements: 14.1 x 5.9 x 4.9 cm = volume: 212 mL. There is diffuse increased renal parenchymal echogenicity. No hydronephrosis or shadowing stone. Left Kidney: Renal measurements: 12.6 x 6.9 x 5.4 cm = volume: 230 mL. There is diffuse increased renal parenchymal echogenicity. No hydronephrosis or shadowing stone. Bladder: There is slight trabeculated appearance of the bladder wall which may be related to chronic bladder outlet obstruction or chronic/fecal impaction. The prostate gland measures 3.9 x 3.2 x 3.6 cm. IMPRESSION: Echogenic kidneys may represent medical renal disease. Clinical correlation is recommended. No hydronephrosis or shadowing stone. Electronically Signed   By: Anner Crete M.D.   On: 09/13/2018 19:55   Dg Chest Portable 1 View  Result Date: 09/13/2018 CLINICAL DATA:  Dehydration, nausea, poor p.o. intake, CHF, type II diabetes mellitus, hypertension EXAM: PORTABLE CHEST 1 VIEW COMPARISON:  Portable exam 1856 hours compared to 12/17/2017 FINDINGS: Enlargement of cardiac silhouette. Mediastinal contours and pulmonary vascularity normal. Lungs clear. No infiltrate, pleural effusion or pneumothorax. Bones unremarkable. IMPRESSION: Enlargement of cardiac silhouette. No acute abnormalities. Electronically Signed   By: Lavonia Dana M.D.   On: 09/13/2018 19:23     CODE STATUS:     Code Status Orders  (From admission, onward)         Start     Ordered   09/13/18 2343  Full code  Continuous     09/13/18 2343        Code Status History    Date Active Date Inactive Code Status Order ID Comments User Context   11/08/2017 1610 11/10/2017 1511 Full Code [id number]  Henreitta Leber, MD Inpatient   07/18/2017 2120 07/19/2017 1825 Full Code [id number]  Gorden Harms, MD Inpatient   08/18/2016 2314 08/19/2016 1901 Full Code [id number]  Vaughan Basta, MD Inpatient   07/09/2016 0141 07/09/2016 1759 Full Code [id number]  Vaughan Basta, MD ED   03/15/2016 2207 03/17/2016 1753  Full Code [id number]  Etta Quill, DO ED   06/23/2015 2139 06/28/2015 1733 Full Code [id number]  Ivor Costa, MD ED   03/07/2015 0133 03/10/2015 2107 Full Code [id number]  Harrie Foreman, MD Inpatient   Advance Care Planning Activity  TOTAL TIME TAKING CARE OF THIS PATIENT: *40* minutes.    Fritzi Mandes M.D on 09/15/2018 at 8:53 AM  Between 7am to 6pm - Pager - [phone] After 6pm go to [email or web address] - password [email or web address] [facility]  Office  [phone]  CC: Primary care physician; [name], MD

## 2018-09-15 NOTE — Progress Notes (Signed)
[facility]  ROUNDING NOTE   Subjective:  Patient well-known to [place] as an outpatient. Has advanced chronic kidney disease at baseline. Comes in with gastroparesis exacerbation. Creatinine was 5.6 upon admission but now down to 4.78. His baseline creatinine appears to be 3.09 with an EGFR 26.   Objective:  Vital signs in last 24 hours:  Temp:  [98.6 F (37 C)-98.9 F (37.2 C)] 98.6 F (37 C) (07/24 1028) Pulse Rate:  [81-94] 88 (07/24 1028) Resp:  [16] 16 (07/24 0435) BP: (136-200)/(81-103) 155/92 (07/24 1028) SpO2:  [100 %] 100 % (07/24 1028)  Weight change:  Filed Weights   09/13/18 1436 09/13/18 2344 09/14/18 0500  Weight: 108.9 kg 94.6 kg 94.5 kg    Intake/Output: I/O last 3 completed shifts: In: 996.4 [P.O.:420; I.V.:326.4; IV Piggyback:250] Out: 2600 [Urine:2575; Emesis/NG output:25]   Intake/Output this shift:  No intake/output data recorded.  Physical Exam: General: No acute distress  Head: Normocephalic, atraumatic. Moist oral mucosal membranes  Eyes: Anicteric  Neck: Supple, trachea midline  Lungs:  Clear to auscultation, normal effort  Heart: S1S2 no rubs  Abdomen:  Soft, nontender, bowel sounds present  Extremities: No peripheral edema.  Neurologic: Awake, alert, following commands  Skin: No lesions       Basic Metabolic Panel: Recent Labs  Lab 09/13/18 1442 09/14/18 0418 09/15/18 0425  NA 142 146*  --   K 3.6 3.4*  --   CL 108 112*  --   CO2 25 25  --   GLUCOSE 151* 115*  --   BUN 61* 60*  --   CREATININE 5.60* 5.26* 4.78*  CALCIUM 8.1* 8.2*  --     Liver Function Tests: Recent Labs  Lab 09/13/18 1442  AST 14*  ALT 17  ALKPHOS 51  BILITOT 0.6  PROT 5.7*  ALBUMIN 2.4*   Recent Labs  Lab 09/13/18 1442  LIPASE 22   No results for input(s): AMMONIA in the last 168 hours.  CBC: Recent Labs  Lab 09/13/18 1442 09/14/18 0418 09/15/18 0425  WBC 7.7 9.6 8.7  HGB 8.8* 9.2* 9.0*  HCT 28.3* 29.6* 29.6*  MCV 74.1*  73.3* 74.0*  PLT 260 275 294    Cardiac Enzymes: No results for input(s): CKTOTAL, CKMB, CKMBINDEX, TROPONINI in the last 168 hours.  BNP: Invalid input(s): POCBNP  CBG: Recent Labs  Lab 09/14/18 0523 09/14/18 1141 09/14/18 1825 09/15/18 0027 09/15/18 0618  GLUCAP 104* 106* 134* 121* 94    Microbiology: Results for orders placed or performed during the hospital encounter of 09/13/18  SARS Coronavirus 2 (CEPHEID- Performed in [facility] hospital lab), Hosp Order     Status: None   Collection Time: 09/13/18  6:36 PM   Specimen: Nasopharyngeal Swab  Result Value Ref Range Status   SARS Coronavirus 2 NEGATIVE NEGATIVE Final    Comment: (NOTE) If result is NEGATIVE SARS-CoV-2 target nucleic acids are NOT DETECTED. The SARS-CoV-2 RNA is generally detectable in upper and lower  respiratory specimens during the acute phase of infection. The lowest  concentration of SARS-CoV-2 viral copies this assay can detect is 250  copies / mL. A negative result does not preclude SARS-CoV-2 infection  and should not be used as the sole basis for treatment or other  patient management decisions.  A negative result may occur with  improper specimen collection / handling, submission of specimen other  than nasopharyngeal swab, presence of viral mutation(s) within the  areas targeted by this assay, and inadequate number  of viral copies  (<250 copies / mL). A negative result must be combined with clinical  observations, patient history, and epidemiological information. If result is POSITIVE SARS-CoV-2 target nucleic acids are DETECTED. The SARS-CoV-2 RNA is generally detectable in upper and lower  respiratory specimens dur ing the acute phase of infection.  Positive  results are indicative of active infection with SARS-CoV-2.  Clinical  correlation with patient history and other diagnostic information is  necessary to determine patient infection status.  Positive results do  not rule out  bacterial infection or co-infection with other viruses. If result is PRESUMPTIVE POSTIVE SARS-CoV-2 nucleic acids MAY BE PRESENT.   A presumptive positive result was obtained on the submitted specimen  and confirmed on repeat testing.  While 2019 novel coronavirus  (SARS-CoV-2) nucleic acids may be present in the submitted sample  additional confirmatory testing may be necessary for epidemiological  and / or clinical management purposes  to differentiate between  SARS-CoV-2 and other Sarbecovirus currently known to infect humans.  If clinically indicated additional testing with an alternate test  methodology [phone]) is advised. The SARS-CoV-2 RNA is generally  detectable in upper and lower respiratory sp ecimens during the acute  phase of infection. The expected result is Negative. Fact Sheet for Patients:  StrictlyIdeas.no Fact Sheet for Healthcare Providers: BankingDealers.co.za This test is not yet approved or cleared by the [place] FDA and has been authorized for detection and/or diagnosis of SARS-CoV-2 by FDA under an Emergency Use Authorization (EUA).  This EUA will remain in effect (meaning this test can be used) for the duration of the COVID-19 declaration under Section 564(b)(1) of the Act, 21 U.S.C. section 360bbb-3(b)(1), unless the authorization is terminated or revoked sooner. Performed at [facility], [address] [place], [place] [address]     Coagulation Studies: No results for input(s): LABPROT, INR in the last 72 hours.  Urinalysis: No results for input(s): COLORURINE, LABSPEC, PHURINE, GLUCOSEU, HGBUR, BILIRUBINUR, KETONESUR, PROTEINUR, UROBILINOGEN, NITRITE, LEUKOCYTESUR in the last 72 hours.  Invalid input(s): APPERANCEUR    Imaging: US Renal  Result Date: 09/13/2018 CLINICAL DATA:  48 year old male with acute renal insufficiency. EXAM: RENAL / URINARY TRACT ULTRASOUND COMPLETE  COMPARISON:  CT of the abdomen pelvis dated 07/18/2017 FINDINGS: Right Kidney: Renal measurements: 14.1 x 5.9 x 4.9 cm = volume: 212 mL. There is diffuse increased renal parenchymal echogenicity. No hydronephrosis or shadowing stone. Left Kidney: Renal measurements: 12.6 x 6.9 x 5.4 cm = volume: 230 mL. There is diffuse increased renal parenchymal echogenicity. No hydronephrosis or shadowing stone. Bladder: There is slight trabeculated appearance of the bladder wall which may be related to chronic bladder outlet obstruction or chronic/fecal impaction. The prostate gland measures 3.9 x 3.2 x 3.6 cm. IMPRESSION: Echogenic kidneys may represent medical renal disease. Clinical correlation is recommended. No hydronephrosis or shadowing stone. Electronically Signed   By: Anner Crete M.D.   On: 09/13/2018 19:55   Dg Chest Portable 1 View  Result Date: 09/13/2018 CLINICAL DATA:  Dehydration, nausea, poor p.o. intake, CHF, type II diabetes mellitus, hypertension EXAM: PORTABLE CHEST 1 VIEW COMPARISON:  Portable exam 1856 hours compared to 12/17/2017 FINDINGS: Enlargement of cardiac silhouette. Mediastinal contours and pulmonary vascularity normal. Lungs clear. No infiltrate, pleural effusion or pneumothorax. Bones unremarkable. IMPRESSION: Enlargement of cardiac silhouette. No acute abnormalities. Electronically Signed   By: Lavonia Dana M.D.   On: 09/13/2018 19:23     Medications:    . buPROPion  75 mg Oral q  morning - 10a  . carvedilol  12.5 mg Oral Daily  . citalopram  20 mg Oral Daily  . diltiazem  120 mg Oral Daily  . heparin  5,000 Units Subcutaneous Q8H  . insulin aspart  0-9 Units Subcutaneous Q6H  . insulin glargine  10 Units Subcutaneous Daily  . metoCLOPramide  5 mg Oral TID AC & HS  . pantoprazole  40 mg Oral BID  . potassium chloride  40 mEq Oral Daily  . rosuvastatin  40 mg Oral Daily  . spironolactone  25 mg Oral Daily  . tamsulosin  0.4 mg Oral QPC breakfast   acetaminophen **OR**  acetaminophen, hydrALAZINE, labetalol, ondansetron **OR** ondansetron (ZOFRAN) IV  Assessment/ Plan:  48 y.o. male with hypertension, diabetes mellitus type II, diabetic retinopathy, hyperlipidemia, BPH, now admitted with gastroparesis.  1.  Acute renal failure/chronic kidney disease stage IV baseline creatinine 3.09/diabetes mellitus type 2 with chronic kidney disease..  Acute renal failure now secondary to dehydration from gastroparesis.  Creatinine down to 4.78.  Patient requesting discharge.  I have advised him if his gastroparesis is to worsen over the weekend to come back to the emergency department.  He verbalized understanding of this.  He will need continued monitoring of his renal parameters in our office.  2.  Hypertension.  Maintain the patient on carvedilol, diltiazem, and spironolactone.  3.  Anemia of chronic kidney disease.  Continue to monitor hemoglobin closely in the office.  May end up requiring Epogen.  Hematology.   LOS: 2 Lanea Vankirk 7/24/202012:07 PM

## 2018-09-26 ENCOUNTER — Encounter (INDEPENDENT_AMBULATORY_CARE_PROVIDER_SITE_OTHER): Payer: Medicare (Managed Care) | Admitting: Ophthalmology

## 2018-09-28 ENCOUNTER — Other Ambulatory Visit: Payer: Self-pay

## 2018-09-28 ENCOUNTER — Encounter (INDEPENDENT_AMBULATORY_CARE_PROVIDER_SITE_OTHER): Payer: Medicare (Managed Care) | Admitting: Ophthalmology

## 2018-09-28 DIAGNOSIS — H2513 Age-related nuclear cataract, bilateral: Secondary | ICD-10-CM

## 2018-09-28 DIAGNOSIS — H35033 Hypertensive retinopathy, bilateral: Secondary | ICD-10-CM

## 2018-09-28 DIAGNOSIS — E11311 Type 2 diabetes mellitus with unspecified diabetic retinopathy with macular edema: Secondary | ICD-10-CM | POA: Diagnosis not present

## 2018-09-28 DIAGNOSIS — E113412 Type 2 diabetes mellitus with severe nonproliferative diabetic retinopathy with macular edema, left eye: Secondary | ICD-10-CM

## 2018-09-28 DIAGNOSIS — I10 Essential (primary) hypertension: Secondary | ICD-10-CM

## 2018-09-28 DIAGNOSIS — E113511 Type 2 diabetes mellitus with proliferative diabetic retinopathy with macular edema, right eye: Secondary | ICD-10-CM | POA: Diagnosis not present

## 2018-09-28 DIAGNOSIS — H43813 Vitreous degeneration, bilateral: Secondary | ICD-10-CM

## 2018-10-26 ENCOUNTER — Encounter (INDEPENDENT_AMBULATORY_CARE_PROVIDER_SITE_OTHER): Payer: Medicare (Managed Care) | Admitting: Ophthalmology

## 2018-11-28 DIAGNOSIS — R809 Proteinuria, unspecified: Secondary | ICD-10-CM | POA: Insufficient documentation

## 2018-11-28 DIAGNOSIS — N2581 Secondary hyperparathyroidism of renal origin: Secondary | ICD-10-CM | POA: Insufficient documentation

## 2018-11-28 DIAGNOSIS — E876 Hypokalemia: Secondary | ICD-10-CM | POA: Insufficient documentation

## 2018-11-28 DIAGNOSIS — E871 Hypo-osmolality and hyponatremia: Secondary | ICD-10-CM | POA: Insufficient documentation

## 2018-11-28 DIAGNOSIS — R609 Edema, unspecified: Secondary | ICD-10-CM | POA: Insufficient documentation

## 2018-11-28 DIAGNOSIS — I129 Hypertensive chronic kidney disease with stage 1 through stage 4 chronic kidney disease, or unspecified chronic kidney disease: Secondary | ICD-10-CM | POA: Insufficient documentation

## 2018-12-26 LAB — HEMOGLOBIN A1C: Hemoglobin A1C: 5.9

## 2019-01-17 ENCOUNTER — Encounter (INDEPENDENT_AMBULATORY_CARE_PROVIDER_SITE_OTHER): Payer: Self-pay

## 2019-01-17 ENCOUNTER — Ambulatory Visit (INDEPENDENT_AMBULATORY_CARE_PROVIDER_SITE_OTHER): Payer: Medicare (Managed Care) | Admitting: Gastroenterology

## 2019-01-17 ENCOUNTER — Encounter: Payer: Self-pay | Admitting: Gastroenterology

## 2019-01-17 ENCOUNTER — Other Ambulatory Visit: Payer: Self-pay

## 2019-01-17 VITALS — BP 138/87 | HR 82 | Temp 97.4°F | Ht 75.0 in | Wt 208.0 lb

## 2019-01-17 DIAGNOSIS — K3184 Gastroparesis: Secondary | ICD-10-CM

## 2019-01-17 MED ORDER — METOCLOPRAMIDE HCL 10 MG PO TABS: 10.0000 mg | ORAL_TABLET | Freq: Two times a day (BID) | ORAL | 3 refills | Status: DC

## 2019-01-17 NOTE — Progress Notes (Signed)
Gastroenterology Consultation  Referring Provider:     Lavonia Dana, MD Primary Care Physician:  [name], MD Primary Gastroenterologist:  Dr. Allen Norris     Reason for Consultation:     Gastroparesis        HPI:   [name] Trejos. is a 48 y.o. y/o male referred for consultation & management of gastroparesis by Dr. [name], MD.  This patient comes in today with a report of of nausea and vomiting.  Patient states he is up all night vomiting.  The patient's history includes being seen by for other gastroenterologist in the recent past.  He has seen 2 gastroenterologists at the [facility] clinic and he has also recently seen to at [facility].  The patient has undergone an upper endoscopy and a gastric emptying study.  I am now the fifth gastroenterologist the patient is seeing and he has an appointment in February with another GI doctor at [facility].  The patient reports that he did not want to wait till February to be seen states that he was told by a previous gastroenterologist that he should see surgery for possible surgical intervention for his gastroparesis.  The patient did have a gastric emptying study that showed delayed gastric emptying.  The patient's wife states that the patient still has a lot of sugar in his diet and has been eating before he goes to bed.  The patient has been taking omeprazole and Reglan.  The Reglan is only taking before he eats but he has not been taking it before he goes to bed.  There is a report of a significant amount of weight loss due to the nausea and vomiting.  Past Medical History:  Diagnosis Date  . Anxiety   . Cellulitis   . CHF (congestive heart failure) ([place])   . Chronic diarrhea   . Chronic kidney disease   . Chronic pain of both ankles   . Diabetes mellitus without complication ([place])   . Diabetes mellitus, type II ([place])   . Diabetic neuropathy ([place])   . Frequent falls   . Gait instability   . Heart palpitations   . HLD (hyperlipidemia)   .  Hypertension   . Insomnia   . Nausea and vomiting in adult    recurrent    Past Surgical History:  Procedure Laterality Date  . AMPUTATION Left 03/16/2016   Procedure: AMPUTATION DIGIT LEFT HALLUX;  Surgeon: Trula Slade, DPM;  Location: [place];  Service: Podiatry;  Laterality: Left;  can start around 5   . ARTHROSCOPIC REPAIR ACL Left   . COLONOSCOPY WITH PROPOFOL N/A 10/28/2015   Procedure: COLONOSCOPY WITH PROPOFOL;  Surgeon: Lollie Sails, MD;  Location: [facility] ENDOSCOPY;  Service: Endoscopy;  Laterality: N/A;  . COLONOSCOPY WITH PROPOFOL N/A 10/29/2015   Procedure: COLONOSCOPY WITH PROPOFOL;  Surgeon: Lollie Sails, MD;  Location: [facility] ENDOSCOPY;  Service: Endoscopy;  Laterality: N/A;  . ESOPHAGOGASTRODUODENOSCOPY (EGD) WITH PROPOFOL N/A 12/27/2017   Procedure: ESOPHAGOGASTRODUODENOSCOPY (EGD) WITH PROPOFOL;  Surgeon: Toledo, Benay Pike, MD;  Location: [name] ENDOSCOPY;  Service: Gastroenterology;  Laterality: N/A;  . PROSTATE SURGERY  2016  . TONSILECTOMY/ADENOIDECTOMY WITH MYRINGOTOMY    . TONSILLECTOMY      Prior to Admission medications   Medication Sig Start Date End Date Taking? Authorizing Provider  buPROPion (WELLBUTRIN) 75 MG tablet Take 1 tablet (75 mg total) by mouth every morning. 07/05/17   Ursula Alert, MD  calcitRIOL (ROCALTROL) 0.25 MCG capsule Take 1 capsule by  mouth daily. 09/20/17   [provider]  calcium citrate-vitamin D (CITRACAL+D) 315-200 MG-UNIT tablet Take 2 tablets by mouth daily.     [provider]  carvedilol (COREG) 12.5 MG tablet Take 12.5 mg by mouth daily.  09/20/17   [provider]  cholestyramine light (PREVALITE) 4 g packet Take 4 g by mouth daily as needed (digestion).  11/17/15   [provider]  citalopram (CELEXA) 20 MG tablet Take 20 mg by mouth daily.  06/16/17   [provider]  diltiazem (CARDIZEM) 120 MG tablet Take 120 mg by mouth daily.     [provider]  FEROSUL 325 (65 Fe)  MG tablet Take 325 mg by mouth 2 (two) times daily with a meal.  09/07/17   [provider]  fluticasone (FLONASE) 50 MCG/ACT nasal spray Place 1 spray into both nostrils daily. 04/01/18   [provider]  insulin aspart (NOVOLOG) 100 UNIT/ML injection Inject 0-6 Units into the skin 3 (three) times daily before meals. Per sliding scale    [provider]  insulin glargine (LANTUS) 100 UNIT/ML injection Inject 0.15 mLs (15 Units total) into the skin at bedtime. 11/10/17   Fritzi Mandes, MD  lisinopril (PRINIVIL,ZESTRIL) 20 MG tablet Take 20 mg by mouth 2 (two) times daily.  10/04/17   [provider]  metoCLOPramide (REGLAN) 10 MG tablet Take 1 tablet (10 mg total) by mouth every 8 (eight) hours as needed for nausea. 11/07/17 11/07/18  Nance Pear, MD  omeprazole (PRILOSEC) 40 MG capsule Take 80 mg by mouth 2 (two) times a day.  12/27/17   [provider]  ondansetron (ZOFRAN-ODT) 4 MG disintegrating tablet Take 4 mg by mouth every 8 (eight) hours as needed for nausea or vomiting.  10/13/17   [provider]  potassium chloride SA (K-DUR,KLOR-CON) 20 MEQ tablet Take 2 tablets (40 mEq total) by mouth daily. 11/10/17   Fritzi Mandes, MD  rosuvastatin (CRESTOR) 40 MG tablet Take 40 mg by mouth daily.  05/26/17   [provider]  spironolactone (ALDACTONE) 25 MG tablet Take 1 tablet (25 mg total) by mouth daily. Started by Dr. Juleen China 07/26/2018 07/26/18 10/24/18  Alisa Graff, FNP  sucralfate (CARAFATE) 1 g tablet Take 1 g by mouth 4 (four) times daily. 12/14/17   [provider]  tamsulosin (FLOMAX) 0.4 MG CAPS capsule Take 0.4 mg by mouth daily after breakfast.     [provider]    Family History  Problem Relation Age of Onset  . CAD Father   . Stroke Father   . Diabetes Mellitus II Mother   . Kidney failure Mother   . Schizophrenia Mother      Social History   Tobacco Use  . Smoking status: Never Smoker  . Smokeless  tobacco: Never Used  Substance Use Topics  . Alcohol use: No  . Drug use: No    Allergies as of 01/17/2019 - Review Complete 09/13/2018  Allergen Reaction Noted  . Levofloxacin Swelling 03/06/2015  . Phenergan [promethazine hcl] Other (See Comments) 03/06/2015  . Malt  06/24/2017  . Soy allergy Other (See Comments) 07/05/2017    Review of Systems:    All systems reviewed and negative except where noted in HPI.   Physical Exam:  There were no vitals taken for this visit. No LMP for male patient. General:   Alert,  Well-developed, well-nourished, pleasant and copurpose operative in NAD Head:  Normocephalic and atraumatic. Eyes:  Sclera clear, no  icterus.   Conjunctiva pink. Ears:  Normal auditory acuity. Neck:  Supple; no masses or thyromegaly. Rectal:  Deferred.  Msk:  Symmetrical without gross deformities.  Immediate ICU good, equal movement & strength bilaterally. Pulses:  Normal pulses noted. Extremities:  No clubbing or edema.  No cyanosis. Neurologic:  Alert and oriented x3;  grossly normal neurologically. Skin:  Intact without significant lesions or rashes. Lymph Nodes:  No significant cervical adenopathy. Psych:  Alert and cooperative. NoI suspect nmal mood and affect.  Imaging Studies: No results found.  Assessment and Plan:   [name] Krisean Craver. is a 48 y.o. y/o male who comes in for a history of gastroparesis.  The patient has been diagnosed with gastroparesis and has an abnormal gastric emptying study.  The patient has been seen by 4 other gastroenterologist for the same problem and now sees me as a 5 opinion.  The patient also has an appointment at [facility] to see another gastroenterologist in February.  I have told the patient that his gastroparesis is unlikely to improve and managing his symptoms at this point would be his best option.  I have also explained to him that he is unlikely to get a different opinion no matter how many more gastroenterologist he sees.  He  has been spoken to about decreasing the size of his meals and to eat multiple small meals instead of 3 large meals.  He has also been told to take a dose of Reglan before he in addition to before he eats.  He has also been told the risks of tardive dyskinesia with the Reglan.  He has mention that he was recommended to consult with a surgeon for possible surgery to help his symptoms such as a gastrojejunostomy.  The patient will be given a referral surgery.  He will also have his Reglan refilled.  The patient has been explained the plan and agrees with it.  The encounter lasted 45 minutes with counseling and question answering representing greater than 60% of the time.  Lucilla Lame, MD. [facility]al    Note: This dictation was prepared with Dragon dictation along with smaller phrase technology. Any transcriptional errors that result from this process are unintentional.

## 2019-01-25 ENCOUNTER — Ambulatory Visit (INDEPENDENT_AMBULATORY_CARE_PROVIDER_SITE_OTHER): Payer: Medicare (Managed Care) | Admitting: "Endocrinology

## 2019-01-25 ENCOUNTER — Encounter: Payer: Self-pay | Admitting: "Endocrinology

## 2019-01-25 ENCOUNTER — Other Ambulatory Visit: Payer: Self-pay

## 2019-01-25 VITALS — BP 121/82 | HR 92 | Ht 75.0 in | Wt 219.0 lb

## 2019-01-25 DIAGNOSIS — E782 Mixed hyperlipidemia: Secondary | ICD-10-CM | POA: Diagnosis not present

## 2019-01-25 DIAGNOSIS — N184 Chronic kidney disease, stage 4 (severe): Secondary | ICD-10-CM

## 2019-01-25 DIAGNOSIS — E1122 Type 2 diabetes mellitus with diabetic chronic kidney disease: Secondary | ICD-10-CM | POA: Diagnosis not present

## 2019-01-25 DIAGNOSIS — Z794 Long term (current) use of insulin: Secondary | ICD-10-CM

## 2019-01-25 DIAGNOSIS — I10 Essential (primary) hypertension: Secondary | ICD-10-CM

## 2019-01-25 MED ORDER — LANTUS SOLOSTAR 100 UNIT/ML ~~LOC~~ SOPN: 15.0000 [IU] | PEN_INJECTOR | Freq: Every day | SUBCUTANEOUS | 2 refills | Status: DC

## 2019-01-25 MED ORDER — BD PEN NEEDLE SHORT U/F 31G X 8 MM MISC: 1.0000 | 3 refills | Status: DC

## 2019-01-25 NOTE — Patient Instructions (Signed)

## 2019-01-25 NOTE — Progress Notes (Signed)
Endocrinology Consult Note       01/25/2019, 12:22 PM   Subjective:    Patient ID: [name]., male    DOB: 05-07-1970.  Baruc [name]. is being seen in consultation for management of currently uncontrolled symptomatic diabetes requested by  [name], MD.   Past Medical History:  Diagnosis Date  . Anxiety   . Cellulitis   . CHF (congestive heart failure) ([place])   . Chronic diarrhea   . Chronic kidney disease   . Chronic pain of both ankles   . Diabetes mellitus without complication ([place])   . Diabetes mellitus, type II ([place])   . Diabetic neuropathy ([place])   . Frequent falls   . Gait instability   . Heart palpitations   . HLD (hyperlipidemia)   . Hypertension   . Insomnia   . Nausea and vomiting in adult    recurrent    Past Surgical History:  Procedure Laterality Date  . AMPUTATION Left 03/16/2016   Procedure: AMPUTATION DIGIT LEFT HALLUX;  Surgeon: Trula Slade, DPM;  Location: [place];  Service: Podiatry;  Laterality: Left;  can start around 5   . ARTHROSCOPIC REPAIR ACL Left   . COLONOSCOPY WITH PROPOFOL N/A 10/28/2015   Procedure: COLONOSCOPY WITH PROPOFOL;  Surgeon: Lollie Sails, MD;  Location: [facility] ENDOSCOPY;  Service: Endoscopy;  Laterality: N/A;  . COLONOSCOPY WITH PROPOFOL N/A 10/29/2015   Procedure: COLONOSCOPY WITH PROPOFOL;  Surgeon: Lollie Sails, MD;  Location: [facility] ENDOSCOPY;  Service: Endoscopy;  Laterality: N/A;  . ESOPHAGOGASTRODUODENOSCOPY (EGD) WITH PROPOFOL N/A 12/27/2017   Procedure: ESOPHAGOGASTRODUODENOSCOPY (EGD) WITH PROPOFOL;  Surgeon: Toledo, Benay Pike, MD;  Location: [name] ENDOSCOPY;  Service: Gastroenterology;  Laterality: N/A;  . PROSTATE SURGERY  2016  . TONSILECTOMY/ADENOIDECTOMY WITH MYRINGOTOMY    . TONSILLECTOMY      Social History   Socioeconomic History  . Marital status: Married    Spouse name: Julio Sicks  . Number of children:  2  . Years of education: Not on file  . Highest education level: High school graduate  Occupational History    Comment: not employed  Social Needs  . Financial resource strain: Hard  . Food insecurity    Worry: Often true    Inability: Often true  . Transportation needs    Medical: Yes    Non-medical: Yes  Tobacco Use  . Smoking status: Never Smoker  . Smokeless tobacco: Never Used  Substance and Sexual Activity  . Alcohol use: No  . Drug use: No  . Sexual activity: Yes  Lifestyle  . Physical activity    Days per week: 0 days    Minutes per session: 0 min  . Stress: Rather much  Relationships  . Social Herbalist on phone: Three times a week    Gets together: Once a week    Attends religious service: More than 4 times per year    Active member of club or organization: No    Attends meetings of clubs or organizations: Never    Relationship status: Married  Other Topics Concern  . Not on file  Social History Narrative  . Not on file    Family History  Problem Relation Age of Onset  . CAD Father   . Stroke Father   . Diabetes Mellitus II Mother   . Kidney failure Mother   . Schizophrenia Mother     Outpatient Encounter Medications as of 01/25/2019  Medication Sig  . buPROPion (WELLBUTRIN) 75 MG tablet Take 1 tablet (75 mg total) by mouth every morning.  . calcitRIOL (ROCALTROL) 0.25 MCG capsule Take 1 capsule by mouth daily.  . calcium citrate-vitamin D (CITRACAL+D) 315-200 MG-UNIT tablet Take 2 tablets by mouth daily.   . carvedilol (COREG) 12.5 MG tablet Take 12.5 mg by mouth daily.   . cholestyramine light (PREVALITE) 4 g packet Take 4 g by mouth daily as needed (digestion).   . citalopram (CELEXA) 20 MG tablet Take 20 mg by mouth daily.   Marland Kitchen diltiazem (CARDIZEM) 120 MG tablet Take 120 mg by mouth daily.   . FEROSUL 325 (65 Fe) MG tablet Take 325 mg by mouth 2 (two) times daily with a meal.   . fluticasone (FLONASE) 50 MCG/ACT nasal spray Place 1  spray into both nostrils daily.  . Insulin Glargine (LANTUS SOLOSTAR) 100 UNIT/ML Solostar Pen Inject 15 Units into the skin at bedtime.  . Insulin Pen Needle (B-D ULTRAFINE III SHORT PEN) 31G X 8 MM MISC 1 each by Does not apply route as directed.  Marland Kitchen lisinopril (PRINIVIL,ZESTRIL) 20 MG tablet Take 20 mg by mouth 2 (two) times daily.   . metoCLOPramide (REGLAN) 10 MG tablet Take 1 tablet (10 mg total) by mouth 2 (two) times daily at 8 am and 10 pm.  . omeprazole (PRILOSEC) 40 MG capsule Take 80 mg by mouth 2 (two) times a day.   . ondansetron (ZOFRAN-ODT) 4 MG disintegrating tablet Take 4 mg by mouth every 8 (eight) hours as needed for nausea or vomiting.   . potassium chloride SA (K-DUR,KLOR-CON) 20 MEQ tablet Take 2 tablets (40 mEq total) by mouth daily.  . rosuvastatin (CRESTOR) 40 MG tablet Take 40 mg by mouth daily.   Marland Kitchen spironolactone (ALDACTONE) 25 MG tablet Take 1 tablet (25 mg total) by mouth daily. Started by Dr. Juleen China 07/26/2018  . sucralfate (CARAFATE) 1 g tablet Take 1 g by mouth 4 (four) times daily.  . tamsulosin (FLOMAX) 0.4 MG CAPS capsule Take 0.4 mg by mouth daily after breakfast.   . [DISCONTINUED] insulin aspart (NOVOLOG) 100 UNIT/ML injection Inject 0-6 Units into the skin 3 (three) times daily before meals. Per sliding scale  . [DISCONTINUED] insulin glargine (LANTUS) 100 UNIT/ML injection Inject 0.15 mLs (15 Units total) into the skin at bedtime.   No facility-administered encounter medications on file as of 01/25/2019.     ALLERGIES: Allergies  Allergen Reactions  . Levofloxacin Swelling  . Phenergan [Promethazine Hcl] Other (See Comments)    Cramping   . Malt     Other reaction(s): OTHER  . Soy Allergy Other (See Comments)    Other reaction(s): OTHER    VACCINATION STATUS: Immunization History  Administered Date(s) Administered  . Influenza,inj,Quad PF,6+ Mos 03/17/2016, 11/09/2017  . Pneumococcal Polysaccharide-23 03/10/2015, 11/09/2017  . Tdap 10/14/2017     Diabetes He presents for his initial diabetic visit. He has type 2 diabetes mellitus. Onset time: He was diagnosed at approximate age of 10 years. His disease course has been improving (He did have 3 A1c measurements between 2017 in 2019 which are 15.6%, 15%, and 15.2%.  However, his  most recent A1c from November 2020 is 5.9%.). There are no hypoglycemic associated symptoms. Pertinent negatives for hypoglycemia include no confusion, headaches, pallor or seizures. Associated symptoms include blurred vision. Pertinent negatives for diabetes include no chest pain, no fatigue, no polydipsia, no polyphagia, no polyuria and no weakness. There are no hypoglycemic complications. Symptoms are improving. Diabetic complications include nephropathy and retinopathy. (He also has gastroparesis working with GI specialist) Risk factors for coronary artery disease include diabetes mellitus, dyslipidemia, family history, hypertension, male sex, obesity and sedentary lifestyle. Current diabetic treatment includes insulin injections (Is currently on Lantus 14 units every morning, and NovoLog 0-6 units 3 times daily AC.). His weight is fluctuating minimally (He gives history of weighing up to 333 pounds few years ago, progressively losing down to 219 pounds today.). He is following a generally unhealthy diet. When asked about meal planning, he reported none. He has not had a previous visit with a dietitian. He participates in exercise intermittently. (He did not bring any logs nor meter to review.  His recent A1c is 5.9%.) An ACE inhibitor/angiotensin II receptor blocker is being taken. Eye exam is current.  Hyperlipidemia This is a chronic problem. The problem is controlled. Exacerbating diseases include chronic renal disease and diabetes. Pertinent negatives include no chest pain, myalgias or shortness of breath. Risk factors for coronary artery disease include diabetes mellitus, dyslipidemia, family history, hypertension,  male sex and a sedentary lifestyle.  Hypertension This is a chronic problem. The problem is controlled. Associated symptoms include blurred vision. Pertinent negatives include no chest pain, headaches, neck pain, palpitations or shortness of breath. Past treatments include ACE inhibitors. Hypertensive end-organ damage includes kidney disease and retinopathy. Identifiable causes of hypertension include chronic renal disease.     Review of Systems  Constitutional: Negative for chills, fatigue, fever and unexpected weight change.  HENT: Negative for dental problem, mouth sores and trouble swallowing.   Eyes: Positive for blurred vision. Negative for visual disturbance.  Respiratory: Negative for cough, choking, chest tightness, shortness of breath and wheezing.   Cardiovascular: Negative for chest pain, palpitations and leg swelling.  Gastrointestinal: Positive for abdominal distention, nausea and vomiting. Negative for abdominal pain, constipation and diarrhea.  Endocrine: Negative for polydipsia, polyphagia and polyuria.  Genitourinary: Negative for dysuria, flank pain, hematuria and urgency.  Musculoskeletal: Negative for back pain, gait problem, myalgias and neck pain.  Skin: Negative for pallor, rash and wound.  Neurological: Negative for seizures, syncope, weakness, numbness and headaches.  Psychiatric/Behavioral: Negative for confusion and dysphoric mood.    Objective:    BP 121/82   Pulse 92   Ht 6\' 3"  (1.905 m)   Wt 219 lb (99.3 kg)   BMI 27.37 kg/m   Wt Readings from Last 3 Encounters:  01/25/19 219 lb (99.3 kg)  01/17/19 208 lb (94.3 kg)  09/14/18 208 lb 5.4 oz (94.5 kg)     Physical Exam Constitutional:      General: He is not in acute distress.    Appearance: He is well-developed.  HENT:     Head: Normocephalic and atraumatic.  Neck:     Musculoskeletal: Normal range of motion and neck supple.     Thyroid: No thyromegaly.     Trachea: No tracheal deviation.   Cardiovascular:     Rate and Rhythm: Normal rate.     Pulses:          Dorsalis pedis pulses are 1+ on the right side and 1+ on the left side.  Posterior tibial pulses are 1+ on the right side and 1+ on the left side.     Heart sounds: S1 normal and S2 normal. No murmur. No gallop.   Pulmonary:     Effort: Pulmonary effort is normal. No respiratory distress.     Breath sounds: No wheezing.  Abdominal:     General: There is no distension.     Tenderness: There is no abdominal tenderness. There is no guarding.  Musculoskeletal:     Right shoulder: He exhibits no swelling and no deformity.  Skin:    General: Skin is warm and dry.     Findings: No rash.     Nails: There is no clubbing.   Neurological:     Mental Status: He is alert and oriented to person, place, and time.     Cranial Nerves: No cranial nerve deficit.     Sensory: No sensory deficit.     Gait: Gait normal.     Deep Tendon Reflexes: Reflexes are normal and symmetric.  Psychiatric:        Speech: Speech normal.        Behavior: Behavior normal. Behavior is cooperative.        Thought Content: Thought content normal.        Judgment: Judgment normal.       CMP ( most recent) CMP     Component Value Date/Time   NA 146 (H) 09/14/2018 0418   K 3.4 (L) 09/14/2018 0418   CL 112 (H) 09/14/2018 0418   CO2 25 09/14/2018 0418   GLUCOSE 115 (H) 09/14/2018 0418   BUN 60 (H) 09/14/2018 0418   CREATININE 4.78 (H) 09/15/2018 0425   CALCIUM 8.2 (L) 09/14/2018 0418   PROT 5.7 (L) 09/13/2018 1442   ALBUMIN 2.4 (L) 09/13/2018 1442   AST 14 (L) 09/13/2018 1442   ALT 17 09/13/2018 1442   ALKPHOS 51 09/13/2018 1442   BILITOT 0.6 09/13/2018 1442   GFRNONAA 13 (L) 09/15/2018 0425   GFRAA 15 (L) 09/15/2018 0425     Diabetic Labs (most recent): Lab Results  Component Value Date   HGBA1C 5.9 12/26/2018   HGBA1C 15.2 (H) 11/09/2017   HGBA1C 15.0 (H) 07/18/2017     Lipid Panel ( most recent) Lipid Panel      Component Value Date/Time   CHOL 100 06/24/2015 0508   TRIG 94 06/24/2015 0508   HDL 38 (L) 06/24/2015 0508   CHOLHDL 2.6 06/24/2015 0508   VLDL 19 06/24/2015 0508   LDLCALC 43 06/24/2015 0508      Lab Results  Component Value Date   TSH 2.153 03/06/2015      Assessment & Plan:   1. Type 2 diabetes mellitus with stage 4 chronic kidney disease, with long-term current use of insulin ([name]) Zyere [name]. has complicated, currently controlled symptomatic type 2 DM since  48 years of age,  with most recent A1c of 5.9 %.  Previously, she did have several years of uncontrolled diabetes with A1c as high as 15+ percent.  Recent labs reviewed. - I had a long discussion with him about the progressive nature of diabetes and the pathology behind its complications. -his diabetes is complicated by  stage 3-4 renal insufficiency , retinopathy, gastroparesis and he remains at a high risk for more acute and chronic complications which include CAD, CVA, CKD, retinopathy, and neuropathy. These are all discussed in detail with him.  - I have counseled him on diet  and weight management  by adopting a carbohydrate restricted/protein rich diet. Patient is encouraged to switch to  unprocessed or minimally processed     complex starch and increased protein intake (animal or plant source), fruits, and vegetables. -  he is advised to stick to a routine mealtimes to eat 3 meals  a day and avoid unnecessary snacks ( to snack only to correct hypoglycemia).   - he admits that there is a room for improvement in his food and drink choices. - Suggestion is made for him to avoid simple carbohydrates  from his diet including Cakes, Sweet Desserts, Ice Cream, Soda (diet and regular), Sweet Tea, Candies, Chips, Cookies, Store Bought Juices, Alcohol in Excess of  1-2 drinks a day, Artificial Sweeteners,  Coffee Creamer, and "Sugar-free" Products. This will help patient to have more stable blood glucose profile and  potentially avoid unintended weight gain.  - he will be scheduled with Jearld Fenton, RDN, CDE for diabetes education.  - I have approached him with the following individualized plan to manage  his diabetes and patient agrees:   -Given his presentation with more than 100 pounds of weight loss, and recently tight A1c of 5.9%, he would benefit from simplified treatment regimen. -I approached him with Lantus 15 units nightly, hold NovoLog for now, and continue to test blood glucose twice a day -before breakfast and at bedtime. - he is warned not to take insulin without proper monitoring per orders.  - he is encouraged to call clinic for blood glucose levels less than 70 or above 200 mg /dl.  - he is not a candidate for metformin, SGLT2 inhibitors due to concurrent renal insufficiency.  - he will be considered for incretin therapy as appropriate next visit.  - Specific targets for  A1c;  LDL, HDL, Triglycerides, and  Waist Circumference were discussed with the patient.  2) Blood Pressure /Hypertension:  his blood pressure is  controlled to target.   he is advised to continue his current medications including lisinopril 20 mg p.o. daily with breakfast . 3) Lipids/Hyperlipidemia:   Review of his recent lipid panel showed  controlled  LDL at 43 .  he  is advised to continue    rosuvastatin 40 mg daily at bedtime.  Side effects and precautions discussed with him.  4)  Weight/Diet:  Body mass index is 27.37 kg/m.  -Patient gives history of losing 100+ pounds over the years, not a weight loss candidate immediately.     Exercise, and detailed carbohydrates information provided  -  detailed on discharge instructions.  5) Chronic Care/Health Maintenance:  -he  is on ACEI/ARB and Statin medications and  is encouraged to initiate and continue to follow up with Ophthalmology, Dentist,  Podiatrist at least yearly or according to recommendations, and advised to   stay away from smoking. I have recommended  yearly flu vaccine and pneumonia vaccine at least every 5 years; moderate intensity exercise for up to 150 minutes weekly; and  sleep for at least 7 hours a day.  - he is  advised to maintain close follow up with [name], MD for primary care needs, as well as his other providers for optimal and coordinated care.  - Time spent with the patient: 45 minutes, of which >50% was spent in obtaining information about his symptoms, reviewing his previous labs/studies, evaluations, and treatments, counseling him about his currently controlled, complicated type 2 diabetes; hyperlipidemia; hypertension and developing plans for long term treatment based on the latest standards of care/guidelines.  Please refer to " Patient Self Inventory" in the Media  tab for reviewed elements of pertinent patient history.  Terryon [name]. participated in the discussions, expressed understanding, and voiced agreement with the above plans.  All questions were answered to his satisfaction. he is encouraged to contact clinic should he have any questions or concerns prior to his return visit.  Follow up plan: - Return in about 3 months (around 04/25/2019) for Bring Meter and Logs- A1c in Office.  Glade Lloyd, MD [facility] [address] [place], [place] [address] Phone: [phone]  Fax: [phone]    01/25/2019, 12:22 PM  This note was partially dictated with voice recognition software. Similar sounding words can be transcribed inadequately or may not  be corrected upon review.

## 2019-02-20 ENCOUNTER — Telehealth: Payer: Self-pay | Admitting: Gastroenterology

## 2019-02-20 NOTE — Telephone Encounter (Signed)
Patient called & l/m on v/m stating he had seen Dr Allen Norris & was referred out. He would like that information.

## 2019-02-21 NOTE — Telephone Encounter (Signed)
Left vm informing pt his referral has been sent to [facility] in [place].

## 2019-04-06 DIAGNOSIS — R55 Syncope and collapse: Secondary | ICD-10-CM | POA: Insufficient documentation

## 2019-04-06 DIAGNOSIS — E872 Acidosis, unspecified: Secondary | ICD-10-CM | POA: Insufficient documentation

## 2019-04-06 DIAGNOSIS — R42 Dizziness and giddiness: Secondary | ICD-10-CM | POA: Insufficient documentation

## 2019-04-06 DIAGNOSIS — R339 Retention of urine, unspecified: Secondary | ICD-10-CM | POA: Insufficient documentation

## 2019-04-24 ENCOUNTER — Telehealth (INDEPENDENT_AMBULATORY_CARE_PROVIDER_SITE_OTHER): Payer: Self-pay

## 2019-04-24 ENCOUNTER — Other Ambulatory Visit: Payer: Self-pay

## 2019-04-24 ENCOUNTER — Other Ambulatory Visit
Admission: RE | Admit: 2019-04-24 | Discharge: 2019-04-24 | Disposition: A | Discharge status: Home / Self Care | Payer: Medicare (Managed Care) | Source: Ambulatory Visit | Attending: Vascular Surgery | Admitting: Vascular Surgery

## 2019-04-24 DIAGNOSIS — Z01812 Encounter for preprocedural laboratory examination: Secondary | ICD-10-CM | POA: Diagnosis present

## 2019-04-24 DIAGNOSIS — Z20822 Contact with and (suspected) exposure to covid-19: Secondary | ICD-10-CM | POA: Diagnosis not present

## 2019-04-24 LAB — SARS CORONAVIRUS 2 (TAT 6-24 HRS): SARS Coronavirus 2: NEGATIVE

## 2019-04-24 NOTE — Telephone Encounter (Signed)
Spoke with the patient and he is now scheduled with Dr. Lucky Cowboy on 04/26/19 with a 9:30 am arrival to the [facility]. Patient will do covid testing today 04/24/19 between 12:30-2:30 pm at the [facility]. Pre-procedure instructions were discussed and will be mailed to patient as requested. Patient was offered to pick up the information due to the time constraints.

## 2019-04-25 ENCOUNTER — Other Ambulatory Visit (INDEPENDENT_AMBULATORY_CARE_PROVIDER_SITE_OTHER): Payer: Self-pay | Admitting: Nurse Practitioner

## 2019-04-25 ENCOUNTER — Ambulatory Visit (INDEPENDENT_AMBULATORY_CARE_PROVIDER_SITE_OTHER): Payer: Medicare (Managed Care) | Admitting: "Endocrinology

## 2019-04-25 ENCOUNTER — Encounter: Payer: Self-pay | Admitting: "Endocrinology

## 2019-04-25 VITALS — HR 71 | Ht 75.0 in | Wt 213.4 lb

## 2019-04-25 DIAGNOSIS — I10 Essential (primary) hypertension: Secondary | ICD-10-CM

## 2019-04-25 DIAGNOSIS — E782 Mixed hyperlipidemia: Secondary | ICD-10-CM

## 2019-04-25 DIAGNOSIS — E1122 Type 2 diabetes mellitus with diabetic chronic kidney disease: Secondary | ICD-10-CM | POA: Diagnosis not present

## 2019-04-25 DIAGNOSIS — Z794 Long term (current) use of insulin: Secondary | ICD-10-CM | POA: Diagnosis not present

## 2019-04-25 DIAGNOSIS — N184 Chronic kidney disease, stage 4 (severe): Secondary | ICD-10-CM | POA: Diagnosis not present

## 2019-04-25 LAB — POCT GLYCOSYLATED HEMOGLOBIN (HGB A1C): Hemoglobin A1C: 5.6 % (ref 4.0–5.6)

## 2019-04-25 MED ORDER — CEFAZOLIN SODIUM-DEXTROSE 1-4 GM/50ML-% IV SOLN
1.0000 g | Freq: Once | INTRAVENOUS | Status: AC
  Administered 2019-04-26: 12:00:00 1 g via INTRAVENOUS

## 2019-04-25 MED ORDER — LINAGLIPTIN 5 MG PO TABS: 5.0000 mg | ORAL_TABLET | Freq: Every day | ORAL | 3 refills | Status: DC

## 2019-04-25 NOTE — Telephone Encounter (Signed)
Spoke with the patient and gave him his arrival time to the [facility] and discussed the pre-procedure instructions as well.

## 2019-04-25 NOTE — Progress Notes (Signed)
04/25/2019, 6:21 PM   Endocrinology follow-up note  Subjective:    Patient ID: [name] Cooper., male    DOB: Oct 22, 1970.  [name] [name]. is being seen in follow-up after he was seen in consultation for management of currently uncontrolled symptomatic diabetes requested by  [name], MD.   Past Medical History:  Diagnosis Date  . Anxiety   . Cellulitis   . CHF (congestive heart failure) ([place])   . Chronic diarrhea   . Chronic kidney disease   . Chronic pain of both ankles   . Diabetes mellitus without complication ([place])   . Diabetes mellitus, type II ([place])   . Diabetic neuropathy ([place])   . Frequent falls   . Gait instability   . Heart palpitations   . HLD (hyperlipidemia)   . Hypertension   . Insomnia   . Nausea and vomiting in adult    recurrent    Past Surgical History:  Procedure Laterality Date  . AMPUTATION Left 03/16/2016   Procedure: AMPUTATION DIGIT LEFT HALLUX;  Surgeon: Trula Slade, DPM;  Location: [place];  Service: Podiatry;  Laterality: Left;  can start around 5   . ARTHROSCOPIC REPAIR ACL Left   . COLONOSCOPY WITH PROPOFOL N/A 10/28/2015   Procedure: COLONOSCOPY WITH PROPOFOL;  Surgeon: Lollie Sails, MD;  Location: [facility] ENDOSCOPY;  Service: Endoscopy;  Laterality: N/A;  . COLONOSCOPY WITH PROPOFOL N/A 10/29/2015   Procedure: COLONOSCOPY WITH PROPOFOL;  Surgeon: Lollie Sails, MD;  Location: [facility] ENDOSCOPY;  Service: Endoscopy;  Laterality: N/A;  . ESOPHAGOGASTRODUODENOSCOPY (EGD) WITH PROPOFOL N/A 12/27/2017   Procedure: ESOPHAGOGASTRODUODENOSCOPY (EGD) WITH PROPOFOL;  Surgeon: Toledo, Benay Pike, MD;  Location: [name] ENDOSCOPY;  Service: Gastroenterology;  Laterality: N/A;  . PROSTATE SURGERY  2016  . TONSILECTOMY/ADENOIDECTOMY WITH MYRINGOTOMY    . TONSILLECTOMY      Social History   Socioeconomic History  . Marital status: Married    Spouse name:  Julio Sicks  . Number of children: 2  . Years of education: Not on file  . Highest education level: High school graduate  Occupational History    Comment: not employed  Tobacco Use  . Smoking status: Never Smoker  . Smokeless tobacco: Never Used  Substance and Sexual Activity  . Alcohol use: No  . Drug use: No  . Sexual activity: Yes  Other Topics Concern  . Not on file  Social History Narrative  . Not on file   Social Determinants of Health   Financial Resource Strain:   . Difficulty of Paying Living Expenses: Not on file  Food Insecurity:   . Worried About Charity fundraiser in the Last Year: Not on file  . Ran Out of Food in the Last Year: Not on file  Transportation Needs:   . Lack of Transportation (Medical): Not on file  . Lack of Transportation (Non-Medical): Not on file  Physical Activity:   . Days of Exercise per Week: Not on file  . Minutes of Exercise per Session: Not on file  Stress:   . Feeling of Stress : Not on file  Social Connections:   .  Frequency of Communication with Friends and Family: Not on file  . Frequency of Social Gatherings with Friends and Family: Not on file  . Attends Religious Services: Not on file  . Active Member of Clubs or Organizations: Not on file  . Attends Archivist Meetings: Not on file  . Marital Status: Not on file    Family History  Problem Relation Age of Onset  . CAD Father   . Stroke Father   . Diabetes Mellitus II Mother   . Kidney failure Mother   . Schizophrenia Mother     Outpatient Encounter Medications as of 04/25/2019  Medication Sig  . calcium citrate-vitamin D (CITRACAL+D) 315-200 MG-UNIT tablet Take 2 tablets by mouth daily.   . carvedilol (COREG) 12.5 MG tablet Take 12.5 mg by mouth daily.   . citalopram (CELEXA) 20 MG tablet Take 20 mg by mouth daily.   . fluticasone (FLONASE) 50 MCG/ACT nasal spray Place 1 spray into both nostrils daily.  . Insulin Pen Needle (B-D ULTRAFINE III SHORT PEN) 31G X  8 MM MISC 1 each by Does not apply route as directed.  . linagliptin (TRADJENTA) 5 MG TABS tablet Take 1 tablet (5 mg total) by mouth daily.  Marland Kitchen lisinopril (PRINIVIL,ZESTRIL) 20 MG tablet Take 20 mg by mouth 2 (two) times daily.   . metoCLOPramide (REGLAN) 10 MG tablet Take 1 tablet (10 mg total) by mouth 2 (two) times daily at 8 am and 10 pm.  . omeprazole (PRILOSEC) 40 MG capsule Take 80 mg by mouth 2 (two) times a day.   . ondansetron (ZOFRAN-ODT) 4 MG disintegrating tablet Take 4 mg by mouth every 8 (eight) hours as needed for nausea or vomiting.   . rosuvastatin (CRESTOR) 40 MG tablet Take 40 mg by mouth daily.   Marland Kitchen spironolactone (ALDACTONE) 25 MG tablet Take 1 tablet (25 mg total) by mouth daily. Started by Dr. Juleen China 07/26/2018  . tamsulosin (FLOMAX) 0.4 MG CAPS capsule Take 0.4 mg by mouth daily after breakfast.   . [DISCONTINUED] buPROPion (WELLBUTRIN) 75 MG tablet Take 1 tablet (75 mg total) by mouth every morning.  . [DISCONTINUED] calcitRIOL (ROCALTROL) 0.25 MCG capsule Take 1 capsule by mouth daily.  . [DISCONTINUED] cholestyramine light (PREVALITE) 4 g packet Take 4 g by mouth daily as needed (digestion).   . [DISCONTINUED] diltiazem (CARDIZEM) 120 MG tablet Take 120 mg by mouth daily.   . [DISCONTINUED] FEROSUL 325 (65 Fe) MG tablet Take 325 mg by mouth 2 (two) times daily with a meal.   . [DISCONTINUED] Insulin Glargine (LANTUS SOLOSTAR) 100 UNIT/ML Solostar Pen Inject 15 Units into the skin at bedtime.  . [DISCONTINUED] potassium chloride SA (K-DUR,KLOR-CON) 20 MEQ tablet Take 2 tablets (40 mEq total) by mouth daily.  . [DISCONTINUED] sucralfate (CARAFATE) 1 g tablet Take 1 g by mouth 4 (four) times daily.   No facility-administered encounter medications on file as of 04/25/2019.    ALLERGIES: Allergies  Allergen Reactions  . Levofloxacin Swelling  . Phenergan [Promethazine Hcl] Other (See Comments)    Cramping   . Malt     Other reaction(s): OTHER  . Soy Allergy Other (See  Comments)    Other reaction(s): OTHER    VACCINATION STATUS: Immunization History  Administered Date(s) Administered  . Influenza,inj,Quad PF,6+ Mos 03/17/2016, 11/09/2017  . Pneumococcal Polysaccharide-23 03/10/2015, 11/09/2017  . Tdap 10/14/2017    Diabetes He presents for his follow-up diabetic visit. He has type 2 diabetes mellitus. Onset time: He was diagnosed  at approximate age of 25 years. His disease course has been improving (He did have 3 A1c measurements between 2017 in 2019 which are 15.6%, 15%, and 15.2%.  However, his most recent A1c from November 2020 is 5.9%.). There are no hypoglycemic associated symptoms. Pertinent negatives for hypoglycemia include no confusion, headaches, pallor or seizures. Pertinent negatives for diabetes include no blurred vision, no chest pain, no fatigue, no polydipsia, no polyphagia, no polyuria and no weakness. There are no hypoglycemic complications. Symptoms are improving. Diabetic complications include nephropathy and retinopathy. (He also has gastroparesis working with GI specialist) Risk factors for coronary artery disease include diabetes mellitus, dyslipidemia, family history, hypertension, male sex, obesity and sedentary lifestyle. Current diabetic treatment includes insulin injections (Is currently on Lantus 14 units every morning, and NovoLog 0-6 units 3 times daily AC.). His weight is fluctuating minimally (He gives history of weighing up to 333 pounds few years ago, progressively losing down to 219 pounds today.). He is following a generally unhealthy diet. When asked about meal planning, he reported none. He has not had a previous visit with a dietitian. He participates in exercise intermittently. His breakfast blood glucose range is generally 130-140 mg/dl. His overall blood glucose range is 130-140 mg/dl. (He presents with tightly controlled glycemic profile was fasting and postprandial.  His point-of-care A1c is 5.6%.) An ACE  inhibitor/angiotensin II receptor blocker is being taken. Eye exam is current.  Hyperlipidemia This is a chronic problem. The problem is controlled. Exacerbating diseases include chronic renal disease and diabetes. Pertinent negatives include no chest pain, myalgias or shortness of breath. Risk factors for coronary artery disease include diabetes mellitus, dyslipidemia, family history, hypertension, male sex and a sedentary lifestyle.  Hypertension This is a chronic problem. The problem is controlled. Pertinent negatives include no blurred vision, chest pain, headaches, neck pain, palpitations or shortness of breath. Past treatments include ACE inhibitors. Hypertensive end-organ damage includes kidney disease and retinopathy. Identifiable causes of hypertension include chronic renal disease.     Review of Systems  Constitutional: Negative for chills, fatigue, fever and unexpected weight change.  HENT: Negative for dental problem, mouth sores and trouble swallowing.   Eyes: Negative for blurred vision and visual disturbance.  Respiratory: Negative for cough, choking, chest tightness, shortness of breath and wheezing.   Cardiovascular: Negative for chest pain, palpitations and leg swelling.  Gastrointestinal: Positive for abdominal distention, nausea and vomiting. Negative for abdominal pain, constipation and diarrhea.  Endocrine: Negative for polydipsia, polyphagia and polyuria.  Genitourinary: Negative for dysuria, flank pain, hematuria and urgency.  Musculoskeletal: Negative for back pain, gait problem, myalgias and neck pain.  Skin: Negative for pallor, rash and wound.  Neurological: Negative for seizures, syncope, weakness, numbness and headaches.  Psychiatric/Behavioral: Negative for confusion and dysphoric mood.    Objective:    Pulse 71   Ht 6\' 3"  (1.905 m)   Wt 213 lb 6.4 oz (96.8 kg)   BMI 26.67 kg/m   Wt Readings from Last 3 Encounters:  04/25/19 213 lb 6.4 oz (96.8 kg)   01/25/19 219 lb (99.3 kg)  01/17/19 208 lb (94.3 kg)     Physical Exam Constitutional:      General: He is not in acute distress.    Appearance: He is well-developed.  HENT:     Head: Normocephalic and atraumatic.  Neck:     Thyroid: No thyromegaly.     Trachea: No tracheal deviation.  Cardiovascular:     Rate and Rhythm: Normal rate.  Pulses:          Dorsalis pedis pulses are 1+ on the right side and 1+ on the left side.       Posterior tibial pulses are 1+ on the right side and 1+ on the left side.     Heart sounds: S1 normal and S2 normal. No murmur. No gallop.   Pulmonary:     Effort: Pulmonary effort is normal. No respiratory distress.     Breath sounds: No wheezing.  Abdominal:     General: There is no distension.     Tenderness: There is no abdominal tenderness. There is no guarding.  Musculoskeletal:     Right shoulder: No swelling or deformity.     Cervical back: Normal range of motion and neck supple.  Skin:    General: Skin is warm and dry.     Findings: No rash.     Nails: There is no clubbing.  Neurological:     Mental Status: He is alert and oriented to person, place, and time.     Cranial Nerves: No cranial nerve deficit.     Sensory: No sensory deficit.     Gait: Gait normal.     Deep Tendon Reflexes: Reflexes are normal and symmetric.  Psychiatric:        Speech: Speech normal.        Behavior: Behavior normal. Behavior is cooperative.        Thought Content: Thought content normal.        Judgment: Judgment normal.     CMP ( most recent) CMP     Component Value Date/Time   NA 146 (H) 09/14/2018 0418   K 3.4 (L) 09/14/2018 0418   CL 112 (H) 09/14/2018 0418   CO2 25 09/14/2018 0418   GLUCOSE 115 (H) 09/14/2018 0418   BUN 60 (H) 09/14/2018 0418   CREATININE 4.78 (H) 09/15/2018 0425   CALCIUM 8.2 (L) 09/14/2018 0418   PROT 5.7 (L) 09/13/2018 1442   ALBUMIN 2.4 (L) 09/13/2018 1442   AST 14 (L) 09/13/2018 1442   ALT 17 09/13/2018 1442    ALKPHOS 51 09/13/2018 1442   BILITOT 0.6 09/13/2018 1442   GFRNONAA 13 (L) 09/15/2018 0425   GFRAA 15 (L) 09/15/2018 0425     Diabetic Labs (most recent): Lab Results  Component Value Date   HGBA1C 5.6 04/25/2019   HGBA1C 5.9 12/26/2018   HGBA1C 15.2 (H) 11/09/2017     Lipid Panel ( most recent) Lipid Panel     Component Value Date/Time   CHOL 100 06/24/2015 0508   TRIG 94 06/24/2015 0508   HDL 38 (L) 06/24/2015 0508   CHOLHDL 2.6 06/24/2015 0508   VLDL 19 06/24/2015 0508   LDLCALC 43 06/24/2015 0508      Lab Results  Component Value Date   TSH 2.153 03/06/2015      Assessment & Plan:   1. Type 2 diabetes mellitus with stage 4 chronic kidney disease, with long-term current use of insulin ([place]) [name] [name]. has complicated, currently controlled symptomatic type 2 DM since  49 years of age. -Patient presents with controlled glycemic profile and point-of-care A1c is 5.6%.   Previously, she did have several years of uncontrolled diabetes with A1c as high as 15+ percent.  Recent labs reviewed. - I had a long discussion with him about the progressive nature of diabetes and the pathology behind its complications. 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 These are all discussed in detail with him.  - I have counseled him on diet  and weight management  by adopting a carbohydrate restricted/protein rich diet. Patient is encouraged to switch to  unprocessed or minimally processed     complex starch and increased protein intake (animal or plant source), fruits, and vegetables. -  he is advised to stick to a routine mealtimes to eat 3 meals  a day and avoid unnecessary snacks ( to snack only to correct hypoglycemia).   - he  admits there is a room for improvement in his diet and drink choices. -   Suggestion is made for him to avoid simple carbohydrates  from his diet including Cakes, Sweet Desserts / Pastries, Ice Cream, Soda (diet and regular), Sweet Tea, Candies, Chips, Cookies, Sweet Pastries,  Store Bought Juices, Alcohol in Excess of  1-2 drinks a day, Artificial Sweeteners, Coffee Creamer, and "Sugar-free" Products. This will help patient to have stable blood glucose profile and potentially avoid unintended weight gain.  - he will be scheduled with Jearld Fenton, RDN, CDE for diabetes education.  - I have approached him with the following individualized plan to manage  his diabetes and patient agrees:   -Given his presentation with tightly controlled glycemia, significant weight loss, A1c of 5.6%, he would benefit from simplified treatment regimen.    -I discussed and initiated Tradjenta 5 mg p.o. daily with breakfast, and advised to hold insulin for now. - he is encouraged to call clinic for blood glucose levels less than 70 or above 200 mg /dl.  - he is not a candidate for metformin, SGLT2 inhibitors due to concurrent renal insufficiency.  - he will be considered for incretin therapy as appropriate next visit.  - Specific targets for  A1c;  LDL, HDL, Triglycerides, and  Waist Circumference were discussed with the patient.  2) Blood Pressure /Hypertension:   His blood pressure is controlled to target. he is advised to continue his current medications including lisinopril 20 mg p.o. daily with breakfast .  3) Lipids/Hyperlipidemia:   Review of his recent lipid panel showed  controlled  LDL at 43 .  he  is advised to continue rosuvastatin 40 mg daily at bedtime.  Side effects and precautions discussed with him.  4)  Weight/Diet:  Body mass index is 26.67 kg/m.  -Patient gives history of losing 100+ pounds over the years, not a weight loss candidate immediately.     Exercise, and detailed carbohydrates information provided  -  detailed on discharge instructions.  5) Chronic  Care/Health Maintenance:  -he  is on ACEI/ARB and Statin medications and  is encouraged to initiate and continue to follow up with Ophthalmology, Dentist,  Podiatrist at least yearly or according to recommendations, and advised to   stay away from smoking. I have recommended yearly flu vaccine and pneumonia vaccine at least every 5 years; moderate intensity exercise for up to 150 minutes weekly; and  sleep for at least 7 hours a day.  - he is  advised to maintain close follow up with [name], MD for primary care needs, as well as his other providers for optimal and coordinated care.  - Time spent on this patient care encounter:  35 min, of which > 50% was spent in  counseling and the rest reviewing his blood glucose logs , discussing his hypoglycemia and hyperglycemia  episodes, reviewing his current and  previous labs / studies  ( including abstraction from other facilities) and medications  doses and developing a  long term treatment plan and documenting his care.   Please refer to Patient Instructions for Blood Glucose Monitoring and Insulin/Medications Dosing Guide"  in media tab for additional information. Please  also refer to " Patient Self Inventory" in the Media  tab for reviewed elements of pertinent patient history.  [name] [name]. participated in the discussions, expressed understanding, and voiced agreement with the above plans.  All questions were answered to his satisfaction. he is encouraged to contact clinic should he have any questions or concerns prior to his return visit.   Follow up plan: - Return in about 4 months (around 08/25/2019) for Follow up with Pre-visit Labs, Next Visit A1c in Office.  Glade Lloyd, MD [facility] [address] [place],  [address] Phone: [phone]  Fax: [phone]    04/25/2019, 6:21 PM  This note was partially dictated with voice recognition software. Similar sounding words  can be transcribed inadequately or may not  be corrected upon review.

## 2019-04-25 NOTE — Patient Instructions (Signed)

## 2019-04-26 ENCOUNTER — Ambulatory Visit
Admission: RE | Admit: 2019-04-26 | Discharge: 2019-04-26 | Disposition: A | Discharge status: Home / Self Care | Payer: Medicare (Managed Care) | Attending: Vascular Surgery | Admitting: Vascular Surgery

## 2019-04-26 ENCOUNTER — Encounter: Payer: Self-pay | Admitting: Vascular Surgery

## 2019-04-26 ENCOUNTER — Other Ambulatory Visit: Payer: Self-pay

## 2019-04-26 ENCOUNTER — Encounter
Admission: RE | Disposition: A | Discharge status: Home / Self Care | Payer: Self-pay | Source: Home / Self Care | Attending: Vascular Surgery

## 2019-04-26 DIAGNOSIS — E1122 Type 2 diabetes mellitus with diabetic chronic kidney disease: Secondary | ICD-10-CM | POA: Insufficient documentation

## 2019-04-26 DIAGNOSIS — Z881 Allergy status to other antibiotic agents status: Secondary | ICD-10-CM | POA: Diagnosis not present

## 2019-04-26 DIAGNOSIS — I509 Heart failure, unspecified: Secondary | ICD-10-CM | POA: Diagnosis not present

## 2019-04-26 DIAGNOSIS — Z841 Family history of disorders of kidney and ureter: Secondary | ICD-10-CM | POA: Diagnosis not present

## 2019-04-26 DIAGNOSIS — N189 Chronic kidney disease, unspecified: Secondary | ICD-10-CM

## 2019-04-26 DIAGNOSIS — E1142 Type 2 diabetes mellitus with diabetic polyneuropathy: Secondary | ICD-10-CM | POA: Insufficient documentation

## 2019-04-26 DIAGNOSIS — N184 Chronic kidney disease, stage 4 (severe): Secondary | ICD-10-CM | POA: Diagnosis not present

## 2019-04-26 DIAGNOSIS — Z8249 Family history of ischemic heart disease and other diseases of the circulatory system: Secondary | ICD-10-CM | POA: Diagnosis not present

## 2019-04-26 DIAGNOSIS — N186 End stage renal disease: Secondary | ICD-10-CM

## 2019-04-26 DIAGNOSIS — Z89422 Acquired absence of other left toe(s): Secondary | ICD-10-CM | POA: Insufficient documentation

## 2019-04-26 DIAGNOSIS — G4700 Insomnia, unspecified: Secondary | ICD-10-CM | POA: Insufficient documentation

## 2019-04-26 DIAGNOSIS — Z888 Allergy status to other drugs, medicaments and biological substances status: Secondary | ICD-10-CM | POA: Insufficient documentation

## 2019-04-26 DIAGNOSIS — Z833 Family history of diabetes mellitus: Secondary | ICD-10-CM | POA: Diagnosis not present

## 2019-04-26 DIAGNOSIS — Z79899 Other long term (current) drug therapy: Secondary | ICD-10-CM | POA: Insufficient documentation

## 2019-04-26 DIAGNOSIS — Z794 Long term (current) use of insulin: Secondary | ICD-10-CM | POA: Insufficient documentation

## 2019-04-26 DIAGNOSIS — I130 Hypertensive heart and chronic kidney disease with heart failure and stage 1 through stage 4 chronic kidney disease, or unspecified chronic kidney disease: Secondary | ICD-10-CM | POA: Insufficient documentation

## 2019-04-26 DIAGNOSIS — N185 Chronic kidney disease, stage 5: Secondary | ICD-10-CM | POA: Diagnosis not present

## 2019-04-26 DIAGNOSIS — E782 Mixed hyperlipidemia: Secondary | ICD-10-CM | POA: Insufficient documentation

## 2019-04-26 DIAGNOSIS — Z992 Dependence on renal dialysis: Secondary | ICD-10-CM

## 2019-04-26 HISTORY — PX: DIALYSIS/PERMA CATHETER INSERTION: CATH118288

## 2019-04-26 LAB — LIPID PANEL
Cholesterol: 162 mg/dL (ref <200)
HDL: 43 mg/dL (ref >=40)
LDL Cholesterol (Calc): 102 mg/dL (calc) — ABNORMAL HIGH
Non-HDL Cholesterol (Calc): 119 mg/dL (calc) (ref <130)
Total CHOL/HDL Ratio: 3.8 (calc) (ref <5.0)
Triglycerides: 77 mg/dL (ref <150)

## 2019-04-26 LAB — POTASSIUM (ARMC VASCULAR LAB ONLY): Potassium (ARMC vascular lab): 5 (ref 3.5–5.1)

## 2019-04-26 LAB — T4, FREE: Free T4: 1.1 ng/dL (ref 0.8–1.8)

## 2019-04-26 LAB — GLUCOSE, CAPILLARY
Glucose-Capillary: 86 mg/dL (ref 70–99)
Glucose-Capillary: 126 mg/dL — ABNORMAL HIGH (ref 70–99)

## 2019-04-26 LAB — TSH: TSH: 2.14 mIU/L (ref 0.40–4.50)

## 2019-04-26 SURGERY — DIALYSIS/PERMA CATHETER INSERTION
Anesthesia: Moderate Sedation

## 2019-04-26 MED ORDER — FENTANYL CITRATE (PF) 100 MCG/2ML IJ SOLN
INTRAMUSCULAR | Status: DC | PRN
  Administered 2019-04-26: 50 ug via INTRAVENOUS

## 2019-04-26 MED ORDER — SODIUM CHLORIDE 0.9 % IV SOLN: INTRAVENOUS | Status: DC

## 2019-04-26 MED ORDER — MIDAZOLAM HCL 5 MG/5ML IJ SOLN
INTRAMUSCULAR | Status: AC
  Filled 2019-04-26: qty 5

## 2019-04-26 MED ORDER — MIDAZOLAM HCL 2 MG/2ML IJ SOLN
INTRAMUSCULAR | Status: DC | PRN
  Administered 2019-04-26: 2 mg via INTRAVENOUS

## 2019-04-26 MED ORDER — FENTANYL CITRATE (PF) 100 MCG/2ML IJ SOLN
INTRAMUSCULAR | Status: AC
  Filled 2019-04-26: qty 2

## 2019-04-26 MED ORDER — METHYLPREDNISOLONE SODIUM SUCC 125 MG IJ SOLR: 125.0000 mg | Freq: Once | INTRAMUSCULAR | Status: DC | PRN

## 2019-04-26 MED ORDER — FAMOTIDINE 20 MG PO TABS: 40.0000 mg | ORAL_TABLET | Freq: Once | ORAL | Status: DC | PRN

## 2019-04-26 MED ORDER — ONDANSETRON HCL 4 MG/2ML IJ SOLN: 4.0000 mg | Freq: Four times a day (QID) | INTRAMUSCULAR | Status: DC | PRN

## 2019-04-26 MED ORDER — MIDAZOLAM HCL 2 MG/ML PO SYRP: 8.0000 mg | ORAL_SOLUTION | Freq: Once | ORAL | Status: DC | PRN

## 2019-04-26 MED ORDER — HYDROMORPHONE HCL 1 MG/ML IJ SOLN: 1.0000 mg | Freq: Once | INTRAMUSCULAR | Status: DC | PRN

## 2019-04-26 MED ORDER — DIPHENHYDRAMINE HCL 50 MG/ML IJ SOLN: 50.0000 mg | Freq: Once | INTRAMUSCULAR | Status: DC | PRN

## 2019-04-26 MED ORDER — CEFAZOLIN SODIUM-DEXTROSE 1-4 GM/50ML-% IV SOLN
INTRAVENOUS | Status: AC
  Filled 2019-04-26: qty 50

## 2019-04-26 SURGICAL SUPPLY — 8 items
CATH PALINDROME RT-P 15FX23CM (CATHETERS) ×3 IMPLANT
DERMABOND ADVANCED (GAUZE/BANDAGES/DRESSINGS) ×2
DERMABOND ADVANCED .7 DNX12 (GAUZE/BANDAGES/DRESSINGS) ×1 IMPLANT
PACK ANGIOGRAPHY (CUSTOM PROCEDURE TRAY) ×3 IMPLANT
SUT MNCRL 4-0 (SUTURE) ×2
SUT MNCRL 4-0 27XMFL (SUTURE) ×1
SUT PROLENE 0 CT 1 30 (SUTURE) ×3 IMPLANT
SUTURE MNCRL 4-0 27XMF (SUTURE) ×1 IMPLANT

## 2019-04-26 NOTE — Progress Notes (Signed)
Dr. Lucky Cowboy at pt. Bedside, conversing with pt. Re: procedure/ temp. Cath, follow-up. Pt. Verbalized understanding of conversation.

## 2019-04-26 NOTE — H&P (Signed)
[facility] History & Physical Update  The patient was interviewed and re-examined.  The patient's previous History and Physical has been reviewed and is unchanged.  There is no change in the plan of care. We plan to proceed with the scheduled procedure.  [name], MD  04/26/2019, 9:48 AM

## 2019-04-26 NOTE — Op Note (Signed)
OPERATIVE NOTE    PRE-OPERATIVE DIAGNOSIS: 1. ESRD   POST-OPERATIVE DIAGNOSIS: same as above  PROCEDURE: 1. Ultrasound guidance for vascular access to the right internal jugular vein 2. Fluoroscopic guidance for placement of catheter 3. Placement of a 23 cm tip to cuff tunneled hemodialysis catheter via the right internal jugular vein  SURGEON: [name], MD  ANESTHESIA:  Local with Moderate conscious sedation for approximately 15 minutes using 2 mg of Versed and 50 mcg of Fentanyl  ESTIMATED BLOOD LOSS: 3 cc  FLUORO TIME: less than one minute  CONTRAST: none  FINDING(S): 1.  Patent right internal jugular vein  SPECIMEN(S):  None  INDICATIONS:   [name]. is a 49 y.o.male who presents with renal failure and now needs to start dialysis.  The patient needs long term dialysis access for their ESRD, and a Permcath is necessary.  Risks and benefits are discussed and informed consent is obtained.    DESCRIPTION: After obtaining full informed written consent, the patient was brought back to the vascular suited. The patient's right neck and chest were sterilely prepped and draped in a sterile surgical field was created. Moderate conscious sedation was administered during a face to face encounter with the patient throughout the procedure with my supervision of the RN administering medicines and monitoring the patient's vital signs, pulse oximetry, telemetry and mental status throughout from the start of the procedure until the patient was taken to the recovery room.  The right internal jugular vein was visualized with ultrasound and found to be patent. It was then accessed under direct ultrasound guidance and a permanent image was recorded. A wire was placed. After skin nick and dilatation, the peel-away sheath was placed over the wire. I then turned my attention to an area under the clavicle. Approximately 1-2 fingerbreadths below the clavicle a small counterincision was created  and tunneled from the subclavicular incision to the access site. Using fluoroscopic guidance, a 23 centimeter tip to cuff tunneled hemodialysis catheter was selected, and tunneled from the subclavicular incision to the access site. It was then placed through the peel-away sheath and the peel-away sheath was removed. Using fluoroscopic guidance the catheter tips were parked in the right atrium. The appropriate distal connectors were placed. It withdrew blood well and flushed easily with heparinized saline and a concentrated heparin solution was then placed. It was secured to the chest wall with 2 Prolene sutures. The access incision was closed single 4-0 Monocryl. A 4-0 Monocryl pursestring suture was placed around the exit site. Sterile dressings were placed. The patient tolerated the procedure well and was taken to the recovery room in stable condition.  COMPLICATIONS: None  CONDITION: Stable  [name], MD 04/26/2019 12:23 PM   This note was created with Dragon Medical transcription system. Any errors in dictation are purely unintentional.

## 2019-04-26 NOTE — Progress Notes (Signed)
Dr. Lucky Cowboy met patient at bedside and explained procedure to [name]. [name]. Verbalized understanding.

## 2019-05-01 ENCOUNTER — Other Ambulatory Visit: Payer: Self-pay

## 2019-05-01 ENCOUNTER — Encounter (HOSPITAL_COMMUNITY): Payer: Self-pay

## 2019-05-01 ENCOUNTER — Ambulatory Visit (HOSPITAL_COMMUNITY): Payer: Medicare (Managed Care) | Attending: Emergency Medicine

## 2019-05-01 DIAGNOSIS — M546 Pain in thoracic spine: Secondary | ICD-10-CM | POA: Diagnosis not present

## 2019-05-01 DIAGNOSIS — R262 Difficulty in walking, not elsewhere classified: Secondary | ICD-10-CM

## 2019-05-01 NOTE — Therapy (Signed)
[facility] [address] [place], [place], [address] Phone: [phone]   Fax:  [phone]  Physical Therapy Evaluation  Patient Details  Name: [name]. MRN: [id number] Date of Birth: 1970-03-21 Referring Provider (PT): [name], MD   Encounter Date: 05/01/2019  PT End of Session - 05/01/19 1243    Visit Number  1    Number of Visits  12    Date for PT Re-Evaluation  06/19/19    Authorization Type  [employer]    Authorization Time Period  05/01/19 to 06/19/19   7week POC due to gastric surgery week of 3/15   Progress Note Due on Visit  10    PT Start Time  1300    PT Stop Time  1345    PT Time Calculation (min)  45 min    Activity Tolerance  Patient tolerated treatment well    Behavior During Therapy  [facility] for tasks assessed/performed       Past Medical History:  Diagnosis Date  . Anxiety   . Cellulitis   . CHF (congestive heart failure) ([place])   . Chronic diarrhea   . Chronic kidney disease   . Chronic pain of both ankles   . Diabetes mellitus without complication ([place])   . Diabetes mellitus, type II ([place])   . Diabetic neuropathy ([place])   . Frequent falls   . Gait instability   . Heart palpitations   . HLD (hyperlipidemia)   . Hypertension   . Insomnia   . Nausea and vomiting in adult    recurrent    Past Surgical History:  Procedure Laterality Date  . AMPUTATION Left 03/16/2016   Procedure: AMPUTATION DIGIT LEFT HALLUX;  Surgeon: Trula Slade, DPM;  Location: [facility];  Service: Podiatry;  Laterality: Left;  can start around 5   . ARTHROSCOPIC REPAIR ACL Left   . COLONOSCOPY WITH PROPOFOL N/A 10/28/2015   Procedure: COLONOSCOPY WITH PROPOFOL;  Surgeon: Lollie Sails, MD;  Location[facility] ENDOSCOPY;  Service: Endoscopy;  Laterality: N/A;  . COLONOSCOPY WITH PROPOFOL N/A 10/29/2015   Procedure: COLONOSCOPY WITH PROPOFOL;  Surgeon: Lollie Sails, MD;  Location: [facility] ENDOSCOPY;  Service: Endoscopy;   Laterality: N/A;  . DIALYSIS/PERMA CATHETER INSERTION Right 04/26/2019   [facility]   . DIALYSIS/PERMA CATHETER INSERTION N/A 04/26/2019   Procedure: DIALYSIS/PERMA CATHETER INSERTION;  Surgeon: Algernon Huxley, MD;  Location: [facility] CV LAB;  Service: Cardiovascular;  Laterality: N/A;  . ESOPHAGOGASTRODUODENOSCOPY (EGD) WITH PROPOFOL N/A 12/27/2017   Procedure: ESOPHAGOGASTRODUODENOSCOPY (EGD) WITH PROPOFOL;  Surgeon: Toledo, Benay Pike, MD;  Location: [name] ENDOSCOPY;  Service: Gastroenterology;  Laterality: N/A;  . PROSTATE SURGERY  2016  . TONSILECTOMY/ADENOIDECTOMY WITH MYRINGOTOMY    . TONSILLECTOMY      There were no vitals filed for this visit.   Subjective Assessment - 05/01/19 1258    Subjective  Pt reports had a fall 2-3 years ago and cracked T12 and has been experiencing pain ever since. Pt reports not doing much lifting. Pt reports spouse assists with things around the house. Pt reports no injections, no steroids have been tried. Pt reports "chliling out" makes it better. Pt reports "not much" sleep at night. Pt reports pain never gets to 0/10 and if lifting things can go to 10/10. Pt denies numbness/tingling throughout BLE, pt denies loss of b/b. Pt reports falling at [facility] for a study about 2-3 weeks ago and thinks he reinjured himself.  Pertinent History  L great toe amputation    Limitations  Lifting    How long can you sit comfortably?  not long, 30 minutes    How long can you stand comfortably?  "a little bit"    How long can you walk comfortably?  "a little bit"    Patient Stated Goals  "just help me to get this thing loose, just saw it off"    Currently in Pain?  Yes    Pain Score  7     Pain Location  Back    Pain Orientation  Mid;Right   "T12"   Pain Descriptors / Indicators  Sharp    Pain Type  Chronic pain    Pain Onset  Other (comment)   2-3 years   Pain Frequency  Constant    Aggravating Factors   lifting things, standing, walking    Pain Relieving Factors   "chilling out"    Effect of Pain on Daily Activities  limited         [facility] PT Assessment - 05/01/19 0001      Assessment   Medical Diagnosis  lower thoracic back pain    Referring Provider (PT)  [name], MD    Onset Date/Surgical Date  --   2-3 years ago   Next MD Visit  none scheduled    Prior Therapy  Yes, same issue with positive results but unsure how long      Precautions   Precautions  Back    Precaution Comments  Pt reports MD told him he couldn't lift, bend or twist, but no weight limit      Restrictions   Weight Bearing Restrictions  No      Balance Screen   Has the patient fallen in the past 6 months  Yes    How many times?  1    Has the patient had a decrease in activity level because of a fear of falling?   No    Is the patient reluctant to leave their home because of a fear of falling?   No      Prior Function   Level of Independence  Independent    Leisure  "I don't do much fun"      Cognition   Overall Cognitive Status  Within Functional Limits for tasks assessed      Observation/Other Assessments   Focus on Therapeutic Outcomes (FOTO)   56% limited    Other Surveys   Neck Disability Index    Neck Disability Index   20/50      Sensation   Light Touch  Appears Intact   BUE and BLE     Coordination   Finger Nose Finger Test  no deficits bilaterally    Heel Shin Test  no deficits bilaterally      Functional Tests   Functional tests  Sit to Stand      Sit to Stand   Comments  5x STS: 35 sec, form chair, with BUE assist, maintains forward flexed trunk      Posture/Postural Control   Posture/Postural Control  Postural limitations    Postural Limitations  Rounded Shoulders;Forward head;Flexed trunk;Decreased lumbar lordosis;Decreased thoracic kyphosis    Posture Comments  flat back, decreased cervical and thoracic curves      ROM / Strength   AROM / PROM / Strength  AROM;Strength      AROM   Overall AROM Comments  increased pain at T12 on  R  side    AROM Assessment Site  Thoracic    Thoracic Flexion  50% limited    Thoracic Extension  75% limited    Thoracic - Right Side Bend  75% limited    Thoracic - Left Side Bend  75% limited    Thoracic - Right Rotation  75% limited    Thoracic - Left Rotation  75% limited      Strength   Overall Strength Comments  BUE and BLE grossly 4/5, grip strength symmetrical      Palpation   SI assessment   "soreness" with gnetle PAs to all thoracic and lumbar vertebrae    Palpation comment  tenderness to palpation throughout R erector spinae and lat musculature, increased tenderness and palpable restrictions more midline and less painful laterally      Special Tests    Special Tests  Lumbar    Lumbar Tests  Slump Test      Slump test   Findings  Negative    Comment  bilaterally      Transfers   Transfers  Supine to Sit;Sit to Supine    Supine to Sit  6: Modified independent (Device/Increase time)    Sit to Supine  6: Modified independent (Device/Increase time)    Comments  increased time with sit<>supine, STS with increased pain      Ambulation/Gait   Ambulation/Gait  Yes    Assistive device  None    Gait Pattern  Step-through pattern;Trunk flexed    Gait Comments  observed pt ambulate into/out of clinic, demonstrates forward flexed trunk, good bil foot clearance, no unsteadiness or knee buckling noted, decreased cadence      Balance   Balance Assessed  Yes      Static Standing Balance   Static Standing - Balance Support  No upper extremity supported    Static Standing Balance -  Activities   Single Leg Stance - Right Leg;Single Leg Stance - Left Leg    Static Standing - Comment/# of Minutes  2-3 sec bilaterally, moderate unsteadiness           Objective measurements completed on examination: See above findings.         PT Education - 05/01/19 1753    Education Details  Assessment findings, FOTO findings, pain cycle/chronic pain    Person(s) Educated  Patient     Methods  Explanation    Comprehension  Verbalized understanding       PT Short Term Goals - 05/01/19 1808      PT SHORT TERM GOAL #1   Title  Pt will perform HEP at least 3x/week to reduce back pain and improve mobility.    Time  3    Period  Weeks    Status  New    Target Date  05/22/19        PT Long Term Goals - 05/01/19 1809      PT LONG TERM GOAL #1   Title  Pt will perform 5x STS in 15 sec or < to reduce risk for falls when rising from chairs to mobilize around community.    Time  6    Period  Weeks    Status  New    Target Date  06/19/19      PT LONG TERM GOAL #2   Title  Pt will ambulate at 1.0 m/s gait speed to reduce risk for falls at home and in community.    Time  6  Period  Weeks    Status  New      PT LONG TERM GOAL #3   Title  Pt will improve thoracic back pain to 5/10 at worst with activity to improve overall QoL.    Time  6    Period  Weeks    Status  New      PT LONG TERM GOAL #4   Title  Pt will improve FOTO to 25% limited to indicate improvement in involvement in activities and reduction in pain with mobility.    Time  6    Period  Weeks    Status  New             Plan - 05/01/19 1754    Clinical Impression Statement  Pt is a 49YO male with lower thoracic back pain for 2-3 years, also c/o gastroparesis and will be undergoing surgical intervention on 05/08/19 to fix it, as well as beginning dialysis 05/07/19. Pt limited in standing duration secondary to pain at low thoracic site. Pt limited in thoracic and lumbar AROM. Pt reports dizziness upon standing, returned to sitting and BP checked 121/79mmHg; all symptoms resolved and able to ambulate out of clinic. [name] mid back pain also possibly due to kidneys and gastro involvement referring pain to that region; will continue to assess. Pt would benefit from skilled PT interventions to improve strength, balance, mobility, posture, and reduce pain with mobility to improve overall QoL.    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 Perform walking test. f/u with POC moving forward due to surgery and dialysis.    PT Home Exercise Plan  To be established at first treatment session    Consulted and Agree with Plan of Care  Patient       Patient will benefit from skilled therapeutic intervention in order to improve the following deficits and impairments:  Abnormal gait, Cardiopulmonary status limiting activity, Decreased activity tolerance, Decreased balance, Decreased coordination, Decreased endurance, Decreased mobility, Decreased range of motion, Decreased strength, Difficulty walking, Hypomobility, Increased muscle spasms, Impaired perceived functional ability, Impaired vision/preception, Improper body mechanics, Postural dysfunction, Pain  Visit Diagnosis: Pain in thoracic spine - Plan: PT plan of care cert/re-cert  Difficulty in walking, not elsewhere classified - Plan: PT plan of care cert/re-cert     Problem List Patient Active Problem List   Diagnosis Date Noted  . Lymphedema 01/09/2018  . Chronic diastolic  heart failure ([place]) 12/13/2017  . Vomiting 11/28/2017  . Acute on chronic heart failure ([place]) 11/23/2017  . Type 2 diabetes mellitus with stage 4 chronic kidney disease, with long-term current use of insulin ([place]) 11/23/2017  . Diabetic retinopathy ([place]) 07/05/2017  . Uncontrolled diabetes mellitus ([place]) 07/08/2016  . Uncontrolled type 2 diabetes mellitus with hyperglycemia, with long-term current use of insulin ([place]) 04/20/2016  . Osteomyelitis of toe of left foot (  [place]) 03/15/2016  . Acute on chronic renal failure ([place]) 06/23/2015  . Diarrhea 06/23/2015  . Mixed hyperlipidemia   . Essential hypertension, benign   . Heart palpitations 05/06/2015    Talbot Grumbling PT, DPT 05/01/19, 6:23 PM [facility] [address] [place], [place], [address] Phone: [phone]   Fax:  [phone]  Name: Lennis Korb. MRN: [id number] Date of Birth: April 23, 1970

## 2019-05-03 ENCOUNTER — Ambulatory Visit (HOSPITAL_COMMUNITY): Payer: Medicare (Managed Care)

## 2019-05-03 ENCOUNTER — Other Ambulatory Visit: Payer: Self-pay

## 2019-05-03 ENCOUNTER — Encounter (HOSPITAL_COMMUNITY): Payer: Self-pay

## 2019-05-03 DIAGNOSIS — M546 Pain in thoracic spine: Secondary | ICD-10-CM | POA: Diagnosis not present

## 2019-05-03 DIAGNOSIS — R262 Difficulty in walking, not elsewhere classified: Secondary | ICD-10-CM

## 2019-05-03 NOTE — Patient Instructions (Signed)
Posture Awareness    Stand and check posture: Jut chin, pull back to comfortable position. Tilt pelvis forward, back; be sure back is not swayed. Roll from heels to balls of feet, then distribute your weight evenly. Picture a line through spine pulling you erect. Focus on breathing. Good Posture = Better Breathing. Check 3 times per day.  http://gt2.exer.us/873   Copyright  VHI. All rights reserved.   Scapular Retraction: Elbow Flexion (Standing)    With elbows bent to 90, pinch shoulder blades together and rotate arms out, keeping elbows bent. Repeat 10 times per set. Do 2 sets per session. Do 1 sessions per day.  http://orth.exer.us/948   Copyright  VHI. All rights reserved.   Bridging    Slowly raise buttocks from floor, keeping stomach tight. Repeat 10 times per set. Do 2 sets per session.  http://orth.exer.us/1096   Copyright  VHI. All rights reserved.   Sit to Stand: Phase 1    Sitting, squeeze pelvic floor and hold. Lean trunk forward. Repeat 10 times. Do 2 times a day.  Copyright  VHI. All rights reserved.

## 2019-05-03 NOTE — Therapy (Signed)
[facility] [place], [place], [address] Phone: [phone]   Fax:  [phone]  Physical Therapy Treatment  Patient Details  Name: [name]. MRN: [id number] Date of Birth: 10-14-1970 Referring Provider (PT): [name], MD   Encounter Date: 05/03/2019  PT End of Session - 05/03/19 0921    Visit Number  2    Number of Visits  12    Date for PT Re-Evaluation  06/19/19   7week POC due to gastric surgery week of 3/15   Authorization Type  [employer]    Authorization Time Period  05/01/19 to 06/19/19    Progress Note Due on Visit  10    PT Start Time  0917    PT Stop Time  0956    PT Time Calculation (min)  39 min    Activity Tolerance  Patient tolerated treatment well    Behavior During Therapy  [facility] for tasks assessed/performed       Past Medical History:  Diagnosis Date  . Anxiety   . Cellulitis   . CHF (congestive heart failure) ([place])   . Chronic diarrhea   . Chronic kidney disease   . Chronic pain of both ankles   . Diabetes mellitus without complication ([place])   . Diabetes mellitus, type II ([place])   . Diabetic neuropathy ([place])   . Frequent falls   . Gait instability   . Heart palpitations   . HLD (hyperlipidemia)   . Hypertension   . Insomnia   . Nausea and vomiting in adult    recurrent    Past Surgical History:  Procedure Laterality Date  . AMPUTATION Left 03/16/2016   Procedure: AMPUTATION DIGIT LEFT HALLUX;  Surgeon: Trula Slade, DPM;  Location: [facility];  Service: Podiatry;  Laterality: Left;  can start around 5   . ARTHROSCOPIC REPAIR ACL Left   . COLONOSCOPY WITH PROPOFOL N/A 10/28/2015   Procedure: COLONOSCOPY WITH PROPOFOL;  Surgeon: Lollie Sails, MD;  Location: [facility] ENDOSCOPY;  Service: Endoscopy;  Laterality: N/A;  . COLONOSCOPY WITH PROPOFOL N/A 10/29/2015   Procedure: COLONOSCOPY WITH PROPOFOL;  Surgeon: Lollie Sails, MD;  Location: [facility] ENDOSCOPY;  Service: Endoscopy;   Laterality: N/A;  . DIALYSIS/PERMA CATHETER INSERTION Right 04/26/2019   [facility]   . DIALYSIS/PERMA CATHETER INSERTION N/A 04/26/2019   Procedure: DIALYSIS/PERMA CATHETER INSERTION;  Surgeon: Algernon Huxley, MD;  Location: [facility] CV LAB;  Service: Cardiovascular;  Laterality: N/A;  . ESOPHAGOGASTRODUODENOSCOPY (EGD) WITH PROPOFOL N/A 12/27/2017   Procedure: ESOPHAGOGASTRODUODENOSCOPY (EGD) WITH PROPOFOL;  Surgeon: Toledo, Benay Pike, MD;  Location: [name] ENDOSCOPY;  Service: Gastroenterology;  Laterality: N/A;  . PROSTATE SURGERY  2016  . TONSILECTOMY/ADENOIDECTOMY WITH MYRINGOTOMY    . TONSILLECTOMY      There were no vitals filed for this visit.  Subjective Assessment - 05/03/19 0920    Subjective  Pt stated he is feeling okay today, no reports of current pain.  Plans for surgery next Tuesday.  Received 1st vaccination shot yesterday, no side effects except for sore arm    Pertinent History  L great toe amputation    Patient Stated Goals  "just help me to get this thing loose, just saw it off"    Currently in Pain?  No/denies         [facility] PT Assessment - 05/03/19 0001      Assessment   Medical Diagnosis  lower thoracic back pain    Referring Provider (  PT)  [name], MD    Onset Date/Surgical Date  --   2-3 years ago   Next MD Visit  none scheduled    Prior Therapy  Yes, same issue with positive results but unsure how long      Precautions   Precautions  Back    Precaution Comments  Pt reports MD told him he couldn't lift, bend or twist, but no weight limit      Ambulation/Gait   Ambulation Distance (Feet)  440 Feet    Assistive device  None    Gait Pattern  Step-through pattern;Trunk flexed    Gait Comments  2MWT, 1 LOB due to foot coming out of crocks, able to regain I.  Pt presents with forward head and rounded shoulder                   [facility] Adult PT Treatment/Exercise - 05/03/19 0001      Posture/Postural Control   Posture/Postural Control   Postural limitations    Postural Limitations  Rounded Shoulders;Forward head;Flexed trunk;Decreased lumbar lordosis;Decreased thoracic kyphosis    Posture Comments  flat back, decreased cervical and thoracic curves      Exercises   Exercises  Lumbar      Lumbar Exercises: Seated   Sit to Stand  5 reps    Sit to Stand Limitations  2 sets    Other Seated Lumbar Exercises  3D thoracic excursion with arms crossed on chest    Other Seated Lumbar Exercises  Cervical and scapular retraction 10x 2 sets             PT Education - 05/03/19 0929    Education Details  Reviewed goals, educated importance of HEP compliance for maximal benefits.  Established HEP with proper mechanics demonstrated and printout given.  2MWT    Person(s) Educated  Patient    Methods  Explanation;Verbal cues;Handout    Comprehension  Verbalized understanding;Returned demonstration;Verbal cues required       PT Short Term Goals - 05/01/19 1808      PT SHORT TERM GOAL #1   Title  Pt will perform HEP at least 3x/week to reduce back pain and improve mobility.    Time  3    Period  Weeks    Status  New    Target Date  05/22/19        PT Long Term Goals - 05/01/19 1809      PT LONG TERM GOAL #1   Title  Pt will perform 5x STS in 15 sec or < to reduce risk for falls when rising from chairs to mobilize around community.    Time  6    Period  Weeks    Status  New    Target Date  06/19/19      PT LONG TERM GOAL #2   Title  Pt will ambulate at 1.0 m/s gait speed to reduce risk for falls at home and in community.    Time  6    Period  Weeks    Status  New      PT LONG TERM GOAL #3   Title  Pt will improve thoracic back pain to 5/10 at worst with activity to improve overall QoL.    Time  6    Period  Weeks    Status  New      PT LONG TERM GOAL #4   Title  Pt will improve FOTO to 25% limited to indicate  improvement in involvement in activities and reduction in pain with mobility.    Time  6    Period   Weeks    Status  New            Plan - 05/03/19 0933    Clinical Impression Statement  Reviewed goals, educated importance of HEP compliance and established home exercise progam this session.  2MWT complete without AD, pt with one LOB episode due to foot coming out of crocks, able to regain independently.  Encouraged pt to use strap on posterior heel for fall prevention, verbalized understanding.  Pt educated importance of posture for pain control.  Therex focus on thoracic mobility, postural strengthening and gluteal strengthening to assist with functional activities.  No reports of pain through session.    Personal Factors and Comorbidities  Comorbidity 2;Time since onset of injury/illness/exacerbation    Comorbidities  CKF, diabetic neuropathy    Examination-Activity Limitations  Bend;Carry;Lift;Locomotion Level;Sit;Stand;Transfers    Examination-Participation Restrictions  Cleaning;Community Activity;Driving;Laundry;Meal Prep;Yard Work    Stability/Clinical Decision Making  Stable/Uncomplicated    Clinical Decision Making  Low    PT Frequency  2x / week    PT Duration  6 weeks    PT Treatment/Interventions  ADLs/Self Care Home Management;Aquatic Therapy;Cryotherapy;Moist Heat;DME Instruction;Gait training;Stair training;Functional mobility training;Therapeutic activities;Therapeutic exercise;Balance training;Neuromuscular re-education;Patient/family education;Orthotic Fit/Training;Manual techniques;Passive range of motion;Taping;Joint Manipulations    PT Next Visit Plan  Pt will be out of therapy next week due to surgery and dialysis. Review compliance with HEP and progess strength, balance, mobility, posture as able.    PT Home Exercise Plan  05/03/19: postural awareness, scapular retraction, bridge, STS, 3D thoracic excursion       Patient will benefit from skilled therapeutic intervention in order to improve the following deficits and impairments:  Abnormal gait, Cardiopulmonary  status limiting activity, Decreased activity tolerance, Decreased balance, Decreased coordination, Decreased endurance, Decreased mobility, Decreased range of motion, Decreased strength, Difficulty walking, Hypomobility, Increased muscle spasms, Impaired perceived functional ability, Impaired vision/preception, Improper body mechanics, Postural dysfunction, Pain  Visit Diagnosis: Pain in thoracic spine  Difficulty in walking, not elsewhere classified     Problem List Patient Active Problem List   Diagnosis Date Noted  . Lymphedema 01/09/2018  . Chronic diastolic heart failure ([place]) 12/13/2017  . Vomiting 11/28/2017  . Acute on chronic heart failure ([place]) 11/23/2017  . Type 2 diabetes mellitus with stage 4 chronic kidney disease, with long-term current use of insulin ([place]) 11/23/2017  . Diabetic retinopathy ([place]) 07/05/2017  . Uncontrolled diabetes mellitus ([place]) 07/08/2016  . Uncontrolled type 2 diabetes mellitus with hyperglycemia, with long-term current use of insulin ([place]) 04/20/2016  . Osteomyelitis of toe of left foot ([place]) 03/15/2016  . Acute on chronic renal failure ([place]) 06/23/2015  . Diarrhea 06/23/2015  . Mixed hyperlipidemia   . Essential hypertension, benign   . Heart palpitations 05/06/2015   Ihor Austin, LPTA/CLT; CBIS [phone]  Aldona Lento 05/03/2019, 11:21 AM  [facility] [address] [place], [place], [address] Phone: [phone]   Fax:  [phone]  Name: [name]. MRN: [id number] Date of Birth: 02-27-1970

## 2019-05-07 DIAGNOSIS — R079 Chest pain, unspecified: Secondary | ICD-10-CM | POA: Insufficient documentation

## 2019-05-07 DIAGNOSIS — D689 Coagulation defect, unspecified: Secondary | ICD-10-CM | POA: Insufficient documentation

## 2019-05-07 DIAGNOSIS — F339 Major depressive disorder, recurrent, unspecified: Secondary | ICD-10-CM | POA: Insufficient documentation

## 2019-05-07 DIAGNOSIS — D649 Anemia, unspecified: Secondary | ICD-10-CM | POA: Insufficient documentation

## 2019-05-07 DIAGNOSIS — Z4901 Encounter for fitting and adjustment of extracorporeal dialysis catheter: Secondary | ICD-10-CM | POA: Insufficient documentation

## 2019-05-07 DIAGNOSIS — D509 Iron deficiency anemia, unspecified: Secondary | ICD-10-CM | POA: Insufficient documentation

## 2019-05-07 DIAGNOSIS — R52 Pain, unspecified: Secondary | ICD-10-CM | POA: Insufficient documentation

## 2019-05-07 DIAGNOSIS — K3184 Gastroparesis: Secondary | ICD-10-CM | POA: Insufficient documentation

## 2019-05-07 DIAGNOSIS — L299 Pruritus, unspecified: Secondary | ICD-10-CM | POA: Insufficient documentation

## 2019-05-08 DIAGNOSIS — T886XXA Anaphylactic reaction due to adverse effect of correct drug or medicament properly administered, initial encounter: Secondary | ICD-10-CM | POA: Insufficient documentation

## 2019-05-08 HISTORY — DX: Anaphylactic reaction due to adverse effect of correct drug or medicament properly administered, initial encounter: T88.6XXA

## 2019-05-14 DIAGNOSIS — E440 Moderate protein-calorie malnutrition: Secondary | ICD-10-CM | POA: Insufficient documentation

## 2019-05-15 ENCOUNTER — Telehealth (HOSPITAL_COMMUNITY): Payer: Self-pay | Admitting: Physical Therapy

## 2019-05-15 ENCOUNTER — Other Ambulatory Visit (INDEPENDENT_AMBULATORY_CARE_PROVIDER_SITE_OTHER): Payer: Self-pay | Admitting: Vascular Surgery

## 2019-05-15 ENCOUNTER — Ambulatory Visit (HOSPITAL_COMMUNITY): Payer: Medicare (Managed Care) | Admitting: Physical Therapy

## 2019-05-15 DIAGNOSIS — N186 End stage renal disease: Secondary | ICD-10-CM

## 2019-05-15 DIAGNOSIS — Z4931 Encounter for adequacy testing for hemodialysis: Secondary | ICD-10-CM | POA: Insufficient documentation

## 2019-05-15 DIAGNOSIS — Z992 Dependence on renal dialysis: Secondary | ICD-10-CM

## 2019-05-15 NOTE — Telephone Encounter (Signed)
pt called to cx both appts today and friday due to his mom is sick and he needs to be at her bedside

## 2019-05-17 ENCOUNTER — Other Ambulatory Visit: Payer: Self-pay

## 2019-05-17 ENCOUNTER — Ambulatory Visit (INDEPENDENT_AMBULATORY_CARE_PROVIDER_SITE_OTHER): Payer: Medicare (Managed Care)

## 2019-05-17 ENCOUNTER — Encounter (INDEPENDENT_AMBULATORY_CARE_PROVIDER_SITE_OTHER): Payer: Self-pay | Admitting: Nurse Practitioner

## 2019-05-17 ENCOUNTER — Ambulatory Visit (INDEPENDENT_AMBULATORY_CARE_PROVIDER_SITE_OTHER): Payer: Medicare (Managed Care) | Admitting: Nurse Practitioner

## 2019-05-17 VITALS — BP 147/82 | HR 80 | Resp 16 | Ht 75.0 in | Wt 195.0 lb

## 2019-05-17 DIAGNOSIS — N186 End stage renal disease: Secondary | ICD-10-CM

## 2019-05-17 DIAGNOSIS — Z992 Dependence on renal dialysis: Secondary | ICD-10-CM

## 2019-05-17 DIAGNOSIS — E782 Mixed hyperlipidemia: Secondary | ICD-10-CM

## 2019-05-17 DIAGNOSIS — I10 Essential (primary) hypertension: Secondary | ICD-10-CM

## 2019-05-17 NOTE — Progress Notes (Signed)
SUBJECTIVE:  Patient ID: [name]., male    DOB: 1970-06-06, 49 y.o.   MRN: [id number] Chief Complaint  Patient presents with  . Follow-up    ultrasound follow up     HPI  [name]. is a 49 y.o. male that is seen today for evaluation of dialysis access.  The patient recently was started on hemodialysis with his PermCath being placed on 04/26/2019.  The patient states that hemodialysis has been going successfully and he has been adjusting well.  The patient denies amaurosis fugax or recent TIA symptoms. There are no recent neurological changes noted. The patient denies claudication symptoms or rest pain symptoms. The patient denies history of DVT, PE or superficial thrombophlebitis. The patient denies recent episodes of angina or shortness of breath.   The patient is left-hand dominant.  Today the patient underwent noninvasive studies to look for possible permanent dialysis access.  He underwent an upper extremity vein mapping.  Based on the patient's noninvasive studies the patient has adequate access for a right brachiocephalic AV fistula Past Medical History:  Diagnosis Date  . Anxiety   . Cellulitis   . CHF (congestive heart failure) ([place])   . Chronic diarrhea   . Chronic kidney disease   . Chronic pain of both ankles   . Diabetes mellitus without complication ([place])   . Diabetes mellitus, type II ([place])   . Diabetic neuropathy ([place])   . Frequent falls   . Gait instability   . Heart palpitations   . HLD (hyperlipidemia)   . Hypertension   . Insomnia   . Nausea and vomiting in adult    recurrent    Past Surgical History:  Procedure Laterality Date  . AMPUTATION Left 03/16/2016   Procedure: AMPUTATION DIGIT LEFT HALLUX;  Surgeon: Trula Slade, DPM;  Location: [facility];  Service: Podiatry;  Laterality: Left;  can start around 5   . ARTHROSCOPIC REPAIR ACL Left   . COLONOSCOPY WITH PROPOFOL N/A 10/28/2015   Procedure: COLONOSCOPY WITH PROPOFOL;   Surgeon: Lollie Sails, MD;  Location: [facility] ENDOSCOPY;  Service: Endoscopy;  Laterality: N/A;  . COLONOSCOPY WITH PROPOFOL N/A 10/29/2015   Procedure: COLONOSCOPY WITH PROPOFOL;  Surgeon: Lollie Sails, MD;  Location: [facility] ENDOSCOPY;  Service: Endoscopy;  Laterality: N/A;  . DIALYSIS/PERMA CATHETER INSERTION Right 04/26/2019   [facility]   . DIALYSIS/PERMA CATHETER INSERTION N/A 04/26/2019   Procedure: DIALYSIS/PERMA CATHETER INSERTION;  Surgeon: Algernon Huxley, MD;  Location: [place] CV LAB;  Service: Cardiovascular;  Laterality: N/A;  . ESOPHAGOGASTRODUODENOSCOPY (EGD) WITH PROPOFOL N/A 12/27/2017   Procedure: ESOPHAGOGASTRODUODENOSCOPY (EGD) WITH PROPOFOL;  Surgeon: Toledo, Benay Pike, MD;  Location: [name] ENDOSCOPY;  Service: Gastroenterology;  Laterality: N/A;  . PROSTATE SURGERY  2016  . TONSILECTOMY/ADENOIDECTOMY WITH MYRINGOTOMY    . TONSILLECTOMY      Social History   Socioeconomic History  . Marital status: Married    Spouse name: [name]   . Number of children: 2  . Years of education: Not on file  . Highest education level: High school graduate  Occupational History  . Occupation: Disability    Comment: not employed  Tobacco Use  . Smoking status: Never Smoker  . Smokeless tobacco: Never Used  Substance and Sexual Activity  . Alcohol use: No  . Drug use: No  . Sexual activity: Yes  Other Topics Concern  . Not on file  Social History Narrative   Oldest son killed in car crash June  2020.    Social Determinants of Health   Financial Resource Strain:   . Difficulty of Paying Living Expenses:   Food Insecurity:   . Worried About Charity fundraiser in the Last Year:   . Arboriculturist in the Last Year:   Transportation Needs:   . Film/video editor (Medical):   Marland Kitchen Lack of Transportation (Non-Medical):   Physical Activity:   . Days of Exercise per Week:   . Minutes of Exercise per Session:   Stress:   . Feeling of Stress :   Social Connections:   .  Frequency of Communication with Friends and Family:   . Frequency of Social Gatherings with Friends and Family:   . Attends Religious Services:   . Active Member of Clubs or Organizations:   . Attends Archivist Meetings:   Marland Kitchen Marital Status:   Intimate Partner Violence:   . Fear of Current or Ex-Partner:   . Emotionally Abused:   Marland Kitchen Physically Abused:   . Sexually Abused:     Family History  Problem Relation Age of Onset  . CAD Father   . Stroke Father   . Diabetes Mellitus II Mother   . Kidney failure Mother   . Schizophrenia Mother     Allergies  Allergen Reactions  . Levofloxacin Swelling  . Phenergan [Promethazine Hcl] Other (See Comments)    Cramping all over   . Malt Other (See Comments)    Other reaction(s): cramping  . Soy Allergy Other (See Comments)    Other reaction(s): swelling      Review of Systems   Review of Systems: Negative Unless Checked Constitutional: [] Weight loss  [] Fever  [] Chills Cardiac: [] Chest pain   []  Atrial Fibrillation  [] Palpitations   [] Shortness of breath when laying flat   [] Shortness of breath with exertion. [] Shortness of breath at rest Vascular:  [] Pain in legs with walking   [] Pain in legs with standing [] Pain in legs when laying flat   [] Claudication    [] Pain in feet when laying flat    [] History of DVT   [] Phlebitis   [x] Swelling in legs   [] Varicose veins   [] Non-healing ulcers Pulmonary:   [] Uses home oxygen   [] Productive cough   [] Hemoptysis   [] Wheeze  [] COPD   [] Asthma Neurologic:  [] Dizziness   [] Seizures  [] Blackouts [] History of stroke   [] History of TIA  [] Aphasia   [] Temporary Blindness   [] Weakness or numbness in arm   [] Weakness or numbness in leg Musculoskeletal:   [] Joint swelling   [] Joint pain   [] Low back pain  []  History of Knee Replacement [] Arthritis [] back Surgeries  []  Spinal Stenosis    Hematologic:  [] Easy bruising  [] Easy bleeding   [] Hypercoagulable state   [x] Anemic Gastrointestinal:   [] Diarrhea   [] Vomiting  [] Gastroesophageal reflux/heartburn   [] Difficulty swallowing. [] Abdominal pain Genitourinary:  [x] Chronic kidney disease   [] Difficult urination  [] Anuric   [] Blood in urine [] Frequent urination  [] Burning with urination   [] Hematuria Skin:  [] Rashes   [] Ulcers [] Wounds Psychological:  [] History of anxiety   []  History of major depression  []  Memory Difficulties      OBJECTIVE:   Physical Exam  BP (!) 147/82 (BP Location: Right Arm)   Pulse 80   Resp 16   Ht 6\' 3"  (1.905 m)   Wt 195 lb (88.5 kg)   BMI 24.37 kg/m   Gen: WD/WN, NAD Head: [place]/AT, No temporalis wasting.  Ear/Nose/Throat: Hearing  grossly intact, nares w/o erythema or drainage Eyes: PER, EOMI, sclera nonicteric.  Neck: Supple, no masses.  No JVD.  Pulmonary:  Good air movement, no use of accessory muscles.  Cardiac: RRR Vascular:  Normal Allen sign Vessel Right Left  Radial Palpable Palpable   Gastrointestinal: soft, non-distended. No guarding/no peritoneal signs.  Musculoskeletal: M/S 5/5 throughout.  No deformity or atrophy.  Neurologic: Pain and light touch intact in extremities.  Symmetrical.  Speech is fluent. Motor exam as listed above. Psychiatric: Judgment intact, Mood & affect appropriate for pt's clinical situation. Dermatologic: No Venous rashes. No Ulcers Noted.  No changes consistent with cellulitis. Lymph : No Cervical lymphadenopathy, no lichenification or skin changes of chronic lymphedema.       ASSESSMENT AND PLAN:  1. ESRD on dialysis [facility] Recommend:  At this time the patient does not have appropriate extremity access for dialysis  Patient should have a right brachial cephalic AV fistula created.  I also discussed with the patient that if during surgery the vein is found to be unsuitable a graft may be placed or the basilic vein may be used.  The risks, benefits and alternative therapies were reviewed in detail with the patient.  All questions were answered.  The  patient agrees to proceed with surgery.    2. Essential hypertension, benign Continue antihypertensive medications as already ordered, these medications have been reviewed and there are no changes at this time.   3. Mixed hyperlipidemia Continue statin as ordered and reviewed, no changes at this time    Current Outpatient Medications on File Prior to Visit  Medication Sig Dispense Refill  . calcium citrate-vitamin D (CITRACAL+D) 315-200 MG-UNIT tablet Take 2 tablets by mouth daily.     . carvedilol (COREG) 12.5 MG tablet Take 12.5 mg by mouth daily.   5  . citalopram (CELEXA) 20 MG tablet Take 20 mg by mouth daily.     . fluticasone (FLONASE) 50 MCG/ACT nasal spray Place 1 spray into both nostrils daily.    Marland Kitchen linagliptin (TRADJENTA) 5 MG TABS tablet Take 1 tablet (5 mg total) by mouth daily. 30 tablet 3  . lisinopril (PRINIVIL,ZESTRIL) 20 MG tablet Take 20 mg by mouth 2 (two) times daily.   5  . metoCLOPramide (REGLAN) 10 MG tablet Take 1 tablet (10 mg total) by mouth 2 (two) times daily at 8 am and 10 pm. 60 tablet 3  . omeprazole (PRILOSEC) 40 MG capsule Take 80 mg by mouth 2 (two) times a day.     . ondansetron (ZOFRAN-ODT) 4 MG disintegrating tablet Take 4 mg by mouth every 8 (eight) hours as needed for nausea or vomiting.   1  . rosuvastatin (CRESTOR) 40 MG tablet Take 40 mg by mouth daily.     . tamsulosin (FLOMAX) 0.4 MG CAPS capsule Take 0.4 mg by mouth daily after breakfast.     . Insulin Pen Needle (B-D ULTRAFINE III SHORT PEN) 31G X 8 MM MISC 1 each by Does not apply route as directed. (Patient not taking: Reported on 04/26/2019) 100 each 3  . spironolactone (ALDACTONE) 25 MG tablet Take 1 tablet (25 mg total) by mouth daily. Started by Dr. Juleen China 07/26/2018 90 tablet 3   No current facility-administered medications on file prior to visit.    There are no Patient Instructions on file for this visit. No follow-ups on file.   Kris Hartmann, NP  This note was completed with  Sales executive.  Any errors are purely unintentional.

## 2019-05-18 ENCOUNTER — Ambulatory Visit (HOSPITAL_COMMUNITY): Payer: Medicare (Managed Care)

## 2019-05-22 ENCOUNTER — Telehealth (HOSPITAL_COMMUNITY): Payer: Self-pay

## 2019-05-22 ENCOUNTER — Ambulatory Visit (HOSPITAL_COMMUNITY): Payer: Medicare (Managed Care)

## 2019-05-22 NOTE — Telephone Encounter (Signed)
No Show#1. Therapist called pt due to not showing for appointment. Pt reports his mother is ill and he wants to cancel pt's until further notice. Therapist educated pt on benefit of consistent therapy and asked if pt wanted to be discharged at this time, but pt prefers not to in case he is able to attend. Will call back next Tuesday to determine if pt would like to be discharged or if he is able to attend therapy consistently to improve pain and mobility.  Tori Leiya Keesey PT, DPT 05/22/19, 10:07 AM [phone]

## 2019-05-24 ENCOUNTER — Telehealth (INDEPENDENT_AMBULATORY_CARE_PROVIDER_SITE_OTHER): Payer: Self-pay

## 2019-05-24 ENCOUNTER — Ambulatory Visit (HOSPITAL_COMMUNITY): Payer: Medicare (Managed Care)

## 2019-05-24 NOTE — Telephone Encounter (Signed)
Spoke with the patient and he is now scheduled with Dr. Lucky Cowboy for a right brachial axillary graft on 05/31/19. Patient will do a phone call pre-op on 05/28/19 between 8-1pm and covid testing on 05/29/19 between 8-1 pm at the [facility]. Per the patient a detailed message was left on his voicemail with detailed information.

## 2019-05-24 NOTE — Telephone Encounter (Signed)
I attempted to contact the patient to schedule him for surgery and a message was left for a return call.

## 2019-05-28 ENCOUNTER — Encounter
Admission: RE | Admit: 2019-05-28 | Discharge: 2019-05-28 | Disposition: A | Discharge status: Home / Self Care | Payer: Medicare (Managed Care) | Source: Ambulatory Visit | Attending: Vascular Surgery | Admitting: Vascular Surgery

## 2019-05-28 ENCOUNTER — Other Ambulatory Visit: Payer: Self-pay

## 2019-05-28 ENCOUNTER — Other Ambulatory Visit (INDEPENDENT_AMBULATORY_CARE_PROVIDER_SITE_OTHER): Payer: Self-pay | Admitting: Nurse Practitioner

## 2019-05-28 DIAGNOSIS — Z01818 Encounter for other preprocedural examination: Secondary | ICD-10-CM | POA: Insufficient documentation

## 2019-05-28 DIAGNOSIS — Z20822 Contact with and (suspected) exposure to covid-19: Secondary | ICD-10-CM | POA: Diagnosis not present

## 2019-05-28 HISTORY — DX: Gastro-esophageal reflux disease without esophagitis: K21.9

## 2019-05-28 HISTORY — DX: Personal history of urinary calculi: Z87.442

## 2019-05-28 NOTE — Patient Instructions (Signed)
Your procedure is scheduled on: Thurs 4/8 Report to Day Surgery. To find out your arrival time please call [phone] between 1PM - 3PM on Wed.4/7.  Remember: Instructions that are not followed completely may result in serious medical risk,  up to and including death, or upon the discretion of your surgeon and anesthesiologist your  surgery may need to be rescheduled.     _X__ 1. Do not eat food after midnight the night before your procedure.                 No gum chewing or hard candies. You may drink clear liquids up to 2 hours                 before you are scheduled to arrive for your surgery- DO not drink clear                 liquids within 2 hours of the start of your surgery.                 Clear Liquids include:  water, Gatorade, G2 or                  Gatorade Zero (avoid Red/Purple/Blue), Black Coffee or Tea (Do not add                 anything to coffee or tea). _____2.   Complete the carbohydrate drink provided to you, 2 hours before arrival.  __X__2.  On the morning of surgery brush your teeth with toothpaste and water, you                may rinse your mouth with mouthwash if you wish.  Do not swallow any toothpaste of mouthwash.     ___ 3.  No Alcohol for 24 hours before or after surgery.   ___ 4.  Do Not Smoke or use e-cigarettes For 24 Hours Prior to Your Surgery.                 Do not use any chewable tobacco products for at least 6 hours prior to                 surgery.  ____  5.  Bring all medications with you on the day of surgery if instructed.   __x__  6.  Notify your doctor if there is any change in your medical condition      (cold, fever, infections).     Do not wear jewelry, Do not wear lotions, . Do not shave 48 hours prior to surgery. Men may shave face and neck. Do not bring valuables to the hospital.    [facility] is not responsible for any belongings or valuables.  Contacts, dentures or bridgework may not be worn  into surgery. Leave your suitcase in the car. After surgery it may be brought to your room. For patients admitted to the hospital, discharge time is determined by your treatment team.   Patients discharged the day of surgery will not be allowed to drive home.   Make arrangements for someone to be with you for the first 24 hours of your Same Day Discharge.    Please read over the following fact sheets that you were given:    __x__ Take these medicines the morning of surgery with A SIP OF WATER:    1. carvedilol (COREG) 12.5 MG tablet  2. citalopram (CELEXA) 20 MG tablet  3. fluticasone (FLONASE) 50  MCG/ACT nasal spray  4.metoCLOPramide (REGLAN) 10 MG tablet  5.omeprazole (PRILOSEC) 40 MG capsule  6.rosuvastatin (CRESTOR) 40 MG tablet             7.tamsulosin (FLOMAX) 0.4 MG CAPS capsule DO NOT TAKE lisinopril (PRINIVIL,ZESTRIL) 20 MG tablet or linagliptin (TRADJENTA) 5 MG TABS tablet the morning of surgery  ____ Fleet Enema (as directed)   _x___ Use  wipes as directed  ____ Use Benzoyl Peroxide Gel as instructed  ____ Use inhalers on the day of surgery  ____ Stop metformin 2 days prior to surgery    ____ Take 1/2 of usual insulin dose the night before surgery. No insulin the morning          of surgery.   ____ Stop Coumadin/Plavix/aspirin   ____ Stop Anti-inflammatories    ____ Stop supplements until after surgery.    ____ Bring C-Pap to the hospital.

## 2019-05-28 NOTE — Pre-Procedure Instructions (Signed)
Dr. Bunnie Domino office said it was OK not to repeat the EKG (if we receive a copy of the EKG done at Dr. Archie Balboa office which was requested) or the CBC that was done on 05/09/19.

## 2019-05-28 NOTE — Pre-Procedure Instructions (Signed)
Unable to obtain a current EKG. Will repeat ekg tomorrow with other labs.

## 2019-05-29 ENCOUNTER — Ambulatory Visit (HOSPITAL_COMMUNITY): Payer: Medicare (Managed Care)

## 2019-05-29 ENCOUNTER — Encounter
Admission: RE | Admit: 2019-05-29 | Discharge: 2019-05-29 | Disposition: A | Discharge status: Home / Self Care | Payer: Medicare (Managed Care) | Source: Ambulatory Visit | Attending: Vascular Surgery | Admitting: Vascular Surgery

## 2019-05-29 ENCOUNTER — Other Ambulatory Visit: Payer: Medicare (Managed Care)

## 2019-05-29 ENCOUNTER — Telehealth (HOSPITAL_COMMUNITY): Payer: Self-pay

## 2019-05-29 DIAGNOSIS — Z01818 Encounter for other preprocedural examination: Secondary | ICD-10-CM | POA: Diagnosis not present

## 2019-05-29 LAB — CBC
HCT: 24.8 % — ABNORMAL LOW (ref 39.0–52.0)
Hemoglobin: 7.4 g/dL — ABNORMAL LOW (ref 13.0–17.0)
MCH: 23.8 pg — ABNORMAL LOW (ref 26.0–34.0)
MCHC: 29.8 g/dL — ABNORMAL LOW (ref 30.0–36.0)
MCV: 79.7 fL — ABNORMAL LOW (ref 80.0–100.0)
Platelets: 254 10*3/uL (ref 150–400)
RBC: 3.11 MIL/uL — ABNORMAL LOW (ref 4.22–5.81)
RDW: 15.5 % (ref 11.5–15.5)
WBC: 6.6 10*3/uL (ref 4.0–10.5)
nRBC: 0 % (ref 0.0–0.2)

## 2019-05-29 LAB — APTT: aPTT: 32 seconds (ref 24–36)

## 2019-05-29 LAB — BASIC METABOLIC PANEL
Anion gap: 12 (ref 5–15)
BUN: 75 mg/dL — ABNORMAL HIGH (ref 6–20)
CO2: 18 mmol/L — ABNORMAL LOW (ref 22–32)
Calcium: 8.6 mg/dL — ABNORMAL LOW (ref 8.9–10.3)
Chloride: 111 mmol/L (ref 98–111)
Creatinine, Ser: 9.8 mg/dL — ABNORMAL HIGH (ref 0.61–1.24)
GFR calc Af Amer: 6 mL/min — ABNORMAL LOW (ref >60)
GFR calc non Af Amer: 5 mL/min — ABNORMAL LOW (ref >60)
Glucose, Bld: 89 mg/dL (ref 70–99)
Potassium: 4.1 mmol/L (ref 3.5–5.1)
Sodium: 141 mmol/L (ref 135–145)

## 2019-05-29 LAB — SARS CORONAVIRUS 2 (TAT 6-24 HRS): SARS Coronavirus 2: NEGATIVE

## 2019-05-29 LAB — TYPE AND SCREEN
ABO/RH(D): A POS
Antibody Screen: NEGATIVE

## 2019-05-29 LAB — PROTIME-INR
INR: 1.1 (ref 0.8–1.2)
Prothrombin Time: 13.6 seconds (ref 11.4–15.2)

## 2019-05-29 NOTE — Telephone Encounter (Signed)
No Show#2. Therapist called pt regarding no show for appointment this morning and not calling to cancel. Unable to reach pt so left voicemail educating pt to call clinic back to discuss discharge or continuing therapy. Will attempt to call pt again later today.  Talbot Grumbling PT, DPT 05/29/19, 11:54 AM [phone]

## 2019-05-31 ENCOUNTER — Encounter
Admission: RE | Disposition: A | Discharge status: Home / Self Care | Payer: Self-pay | Source: Home / Self Care | Attending: Vascular Surgery

## 2019-05-31 ENCOUNTER — Encounter (HOSPITAL_COMMUNITY): Payer: Self-pay

## 2019-05-31 ENCOUNTER — Encounter: Payer: Self-pay | Admitting: Vascular Surgery

## 2019-05-31 ENCOUNTER — Telehealth (HOSPITAL_COMMUNITY): Payer: Self-pay

## 2019-05-31 ENCOUNTER — Ambulatory Visit (HOSPITAL_COMMUNITY): Payer: Medicare (Managed Care)

## 2019-05-31 ENCOUNTER — Ambulatory Visit
Admission: RE | Admit: 2019-05-31 | Discharge: 2019-05-31 | Disposition: A | Discharge status: Home / Self Care | Payer: Medicare (Managed Care) | Attending: Vascular Surgery | Admitting: Vascular Surgery

## 2019-05-31 ENCOUNTER — Ambulatory Visit: Payer: Medicare (Managed Care) | Admitting: Certified Registered Nurse Anesthetist

## 2019-05-31 ENCOUNTER — Other Ambulatory Visit: Payer: Self-pay

## 2019-05-31 DIAGNOSIS — Z794 Long term (current) use of insulin: Secondary | ICD-10-CM | POA: Diagnosis not present

## 2019-05-31 DIAGNOSIS — E1122 Type 2 diabetes mellitus with diabetic chronic kidney disease: Secondary | ICD-10-CM | POA: Insufficient documentation

## 2019-05-31 DIAGNOSIS — F419 Anxiety disorder, unspecified: Secondary | ICD-10-CM | POA: Insufficient documentation

## 2019-05-31 DIAGNOSIS — E1140 Type 2 diabetes mellitus with diabetic neuropathy, unspecified: Secondary | ICD-10-CM | POA: Diagnosis not present

## 2019-05-31 DIAGNOSIS — N186 End stage renal disease: Secondary | ICD-10-CM | POA: Insufficient documentation

## 2019-05-31 DIAGNOSIS — E782 Mixed hyperlipidemia: Secondary | ICD-10-CM | POA: Diagnosis not present

## 2019-05-31 DIAGNOSIS — I132 Hypertensive heart and chronic kidney disease with heart failure and with stage 5 chronic kidney disease, or end stage renal disease: Secondary | ICD-10-CM | POA: Diagnosis not present

## 2019-05-31 DIAGNOSIS — I509 Heart failure, unspecified: Secondary | ICD-10-CM | POA: Insufficient documentation

## 2019-05-31 DIAGNOSIS — Z79899 Other long term (current) drug therapy: Secondary | ICD-10-CM | POA: Diagnosis not present

## 2019-05-31 DIAGNOSIS — Z992 Dependence on renal dialysis: Secondary | ICD-10-CM | POA: Diagnosis not present

## 2019-05-31 DIAGNOSIS — N185 Chronic kidney disease, stage 5: Secondary | ICD-10-CM

## 2019-05-31 HISTORY — PX: AV FISTULA PLACEMENT: SHX1204

## 2019-05-31 LAB — POCT I-STAT, CHEM 8
BUN: 28 mg/dL — ABNORMAL HIGH (ref 6–20)
Calcium, Ion: 1.02 mmol/L — ABNORMAL LOW (ref 1.15–1.40)
Chloride: 101 mmol/L (ref 98–111)
Creatinine, Ser: 5.5 mg/dL — ABNORMAL HIGH (ref 0.61–1.24)
Glucose, Bld: 90 mg/dL (ref 70–99)
HCT: 28 % — ABNORMAL LOW (ref 39.0–52.0)
Hemoglobin: 9.5 g/dL — ABNORMAL LOW (ref 13.0–17.0)
Potassium: 3.6 mmol/L (ref 3.5–5.1)
Sodium: 138 mmol/L (ref 135–145)
TCO2: 30 mmol/L (ref 22–32)

## 2019-05-31 LAB — GLUCOSE, CAPILLARY
Glucose-Capillary: 88 mg/dL (ref 70–99)
Glucose-Capillary: 76 mg/dL (ref 70–99)
Glucose-Capillary: 110 mg/dL — ABNORMAL HIGH (ref 70–99)

## 2019-05-31 LAB — ABO/RH: ABO/RH(D): A POS

## 2019-05-31 SURGERY — ARTERIOVENOUS (AV) FISTULA CREATION
Anesthesia: General | Laterality: Right

## 2019-05-31 MED ORDER — HEPARIN SODIUM (PORCINE) 5000 UNIT/ML IJ SOLN
INTRAMUSCULAR | Status: AC
  Filled 2019-05-31: qty 1

## 2019-05-31 MED ORDER — ONDANSETRON HCL 4 MG/2ML IJ SOLN
INTRAMUSCULAR | Status: AC
  Filled 2019-05-31: qty 2

## 2019-05-31 MED ORDER — SODIUM CHLORIDE 0.9 % IV SOLN
INTRAVENOUS | Status: DC | PRN
  Administered 2019-05-31: 20 mL via INTRAMUSCULAR

## 2019-05-31 MED ORDER — FENTANYL CITRATE (PF) 100 MCG/2ML IJ SOLN
INTRAMUSCULAR | Status: AC
  Administered 2019-05-31: 10:00:00 25 ug via INTRAVENOUS
  Filled 2019-05-31: qty 2

## 2019-05-31 MED ORDER — HYDROCODONE-ACETAMINOPHEN 7.5-325 MG PO TABS
1.0000 | ORAL_TABLET | Freq: Once | ORAL | Status: AC | PRN
  Administered 2019-05-31: 10:00:00 1 via ORAL
  Filled 2019-05-31: qty 1

## 2019-05-31 MED ORDER — ONDANSETRON HCL 4 MG/2ML IJ SOLN
INTRAMUSCULAR | Status: DC | PRN
  Administered 2019-05-31: 4 mg via INTRAVENOUS

## 2019-05-31 MED ORDER — SODIUM CHLORIDE 0.9 % IV SOLN
INTRAVENOUS | Status: DC | PRN
  Administered 2019-05-31: 09:00:00 50 ug/min via INTRAVENOUS

## 2019-05-31 MED ORDER — CEFAZOLIN SODIUM-DEXTROSE 1-4 GM/50ML-% IV SOLN
INTRAVENOUS | Status: AC
  Filled 2019-05-31: qty 50

## 2019-05-31 MED ORDER — PHENYLEPHRINE HCL (PRESSORS) 10 MG/ML IV SOLN
INTRAVENOUS | Status: DC | PRN
  Administered 2019-05-31 (×6): 100 ug via INTRAVENOUS

## 2019-05-31 MED ORDER — ONDANSETRON HCL 4 MG/2ML IJ SOLN
4.0000 mg | Freq: Once | INTRAMUSCULAR | Status: AC | PRN
  Administered 2019-05-31: 11:00:00 4 mg via INTRAVENOUS

## 2019-05-31 MED ORDER — BUPIVACAINE HCL (PF) 0.5 % IJ SOLN
INTRAMUSCULAR | Status: AC
  Filled 2019-05-31: qty 30

## 2019-05-31 MED ORDER — FENTANYL CITRATE (PF) 100 MCG/2ML IJ SOLN
INTRAMUSCULAR | Status: DC | PRN
  Administered 2019-05-31 (×2): 25 ug via INTRAVENOUS

## 2019-05-31 MED ORDER — CEFAZOLIN SODIUM-DEXTROSE 1-4 GM/50ML-% IV SOLN
1.0000 g | INTRAVENOUS | Status: AC
  Administered 2019-05-31: 1 g via INTRAVENOUS

## 2019-05-31 MED ORDER — EPINEPHRINE PF 1 MG/ML IJ SOLN
INTRAMUSCULAR | Status: AC
  Filled 2019-05-31: qty 1

## 2019-05-31 MED ORDER — SODIUM CHLORIDE 0.9 % IV SOLN: INTRAVENOUS | Status: DC

## 2019-05-31 MED ORDER — FENTANYL CITRATE (PF) 100 MCG/2ML IJ SOLN
25.0000 ug | INTRAMUSCULAR | Status: DC | PRN
  Administered 2019-05-31 (×2): 25 ug via INTRAVENOUS

## 2019-05-31 MED ORDER — PROPOFOL 500 MG/50ML IV EMUL
INTRAVENOUS | Status: AC
  Filled 2019-05-31: qty 50

## 2019-05-31 MED ORDER — CHLORHEXIDINE GLUCONATE CLOTH 2 % EX PADS: 6.0000 | MEDICATED_PAD | Freq: Once | CUTANEOUS | Status: DC

## 2019-05-31 MED ORDER — HYDROCODONE-ACETAMINOPHEN 5-325 MG PO TABS: 1.0000 | ORAL_TABLET | Freq: Four times a day (QID) | ORAL | 0 refills | Status: DC | PRN

## 2019-05-31 MED ORDER — PROPOFOL 10 MG/ML IV BOLUS
INTRAVENOUS | Status: AC
  Filled 2019-05-31: qty 20

## 2019-05-31 MED ORDER — LIDOCAINE HCL (CARDIAC) PF 100 MG/5ML IV SOSY
PREFILLED_SYRINGE | INTRAVENOUS | Status: DC | PRN
  Administered 2019-05-31: 50 mg via INTRAVENOUS

## 2019-05-31 MED ORDER — FENTANYL CITRATE (PF) 100 MCG/2ML IJ SOLN
INTRAMUSCULAR | Status: AC
  Filled 2019-05-31: qty 2

## 2019-05-31 MED ORDER — PROPOFOL 10 MG/ML IV BOLUS
INTRAVENOUS | Status: DC | PRN
  Administered 2019-05-31: 40 mg via INTRAVENOUS
  Administered 2019-05-31: 160 mg via INTRAVENOUS

## 2019-05-31 MED ORDER — HEPARIN SODIUM (PORCINE) 1000 UNIT/ML IJ SOLN
INTRAMUSCULAR | Status: DC | PRN
  Administered 2019-05-31: 3000 [IU] via INTRAVENOUS

## 2019-05-31 MED ORDER — ACETAMINOPHEN 160 MG/5ML PO SOLN
325.0000 mg | ORAL | Status: DC | PRN
  Filled 2019-05-31: qty 20.3

## 2019-05-31 MED ORDER — CHLORHEXIDINE GLUCONATE CLOTH 2 % EX PADS
6.0000 | MEDICATED_PAD | Freq: Once | CUTANEOUS | Status: AC
  Administered 2019-05-31: 07:00:00 6 via TOPICAL

## 2019-05-31 MED ORDER — HYDROCODONE-ACETAMINOPHEN 7.5-325 MG PO TABS
ORAL_TABLET | ORAL | Status: AC
  Filled 2019-05-31: qty 1

## 2019-05-31 MED ORDER — ACETAMINOPHEN 325 MG PO TABS: 325.0000 mg | ORAL_TABLET | ORAL | Status: DC | PRN

## 2019-05-31 MED ORDER — ACETAMINOPHEN 10 MG/ML IV SOLN
INTRAVENOUS | Status: DC | PRN
  Administered 2019-05-31: 1000 mg via INTRAVENOUS

## 2019-05-31 SURGICAL SUPPLY — 51 items
BAG DECANTER FOR FLEXI CONT (MISCELLANEOUS) ×3 IMPLANT
BLADE SURG SZ11 CARB STEEL (BLADE) ×3 IMPLANT
BOOT SUTURE AID YELLOW STND (SUTURE) ×3 IMPLANT
BRUSH SCRUB EZ  4% CHG (MISCELLANEOUS) ×2
BRUSH SCRUB EZ 4% CHG (MISCELLANEOUS) ×1 IMPLANT
CANISTER SUCT 1200ML W/VALVE (MISCELLANEOUS) ×3 IMPLANT
CHLORAPREP W/TINT 26 (MISCELLANEOUS) ×3 IMPLANT
CLIP SPRNG 6MM S-JAW DBL (CLIP) ×3
COVER WAND RF STERILE (DRAPES) ×3 IMPLANT
DECANTER SPIKE VIAL GLASS SM (MISCELLANEOUS) IMPLANT
DERMABOND ADVANCED (GAUZE/BANDAGES/DRESSINGS) ×2
DERMABOND ADVANCED .7 DNX12 (GAUZE/BANDAGES/DRESSINGS) ×1 IMPLANT
ELECT CAUTERY BLADE 6.4 (BLADE) ×3 IMPLANT
ELECT REM PT RETURN 9FT ADLT (ELECTROSURGICAL) ×3
ELECTRODE REM PT RTRN 9FT ADLT (ELECTROSURGICAL) ×1 IMPLANT
GLOVE BIO SURGEON STRL SZ7 (GLOVE) ×12 IMPLANT
GLOVE INDICATOR 7.5 STRL GRN (GLOVE) ×9 IMPLANT
GOWN STRL REUS W/ TWL LRG LVL3 (GOWN DISPOSABLE) ×3 IMPLANT
GOWN STRL REUS W/ TWL XL LVL3 (GOWN DISPOSABLE) ×1 IMPLANT
GOWN STRL REUS W/TWL LRG LVL3 (GOWN DISPOSABLE) ×6
GOWN STRL REUS W/TWL XL LVL3 (GOWN DISPOSABLE) ×2
HEMOSTAT SURGICEL 2X3 (HEMOSTASIS) ×3 IMPLANT
IV NS 500ML (IV SOLUTION) ×2
IV NS 500ML BAXH (IV SOLUTION) ×1 IMPLANT
KIT TURNOVER KIT A (KITS) ×3 IMPLANT
LABEL OR SOLS (LABEL) ×3 IMPLANT
LOOP RED MAXI  1X406MM (MISCELLANEOUS) ×2
LOOP VESSEL MAXI 1X406 RED (MISCELLANEOUS) ×1 IMPLANT
LOOP VESSEL MINI 0.8X406 BLUE (MISCELLANEOUS) ×1 IMPLANT
LOOPS BLUE MINI 0.8X406MM (MISCELLANEOUS) ×2
NEEDLE FILTER BLUNT 18X 1/2SAF (NEEDLE) ×2
NEEDLE FILTER BLUNT 18X1 1/2 (NEEDLE) ×1 IMPLANT
NS IRRIG 500ML POUR BTL (IV SOLUTION) ×3 IMPLANT
PACK EXTREMITY ARMC (MISCELLANEOUS) ×3 IMPLANT
PAD PREP 24X41 OB/GYN DISP (PERSONAL CARE ITEMS) ×3 IMPLANT
SOLUTION CELL SAVER (CLIP) ×1 IMPLANT
STOCKINETTE STRL 4IN 9604848 (GAUZE/BANDAGES/DRESSINGS) ×3 IMPLANT
SUT GORETEX CV-6TTC-13 36IN (SUTURE) ×6 IMPLANT
SUT MNCRL AB 4-0 PS2 18 (SUTURE) ×3 IMPLANT
SUT PROLENE 6 0 BV (SUTURE) ×6 IMPLANT
SUT SILK 0 SH 30 (SUTURE) ×3 IMPLANT
SUT SILK 2 0 (SUTURE) ×2
SUT SILK 2-0 18XBRD TIE 12 (SUTURE) ×1 IMPLANT
SUT SILK 3 0 (SUTURE) ×2
SUT SILK 3-0 18XBRD TIE 12 (SUTURE) ×1 IMPLANT
SUT SILK 4 0 (SUTURE) ×2
SUT SILK 4-0 18XBRD TIE 12 (SUTURE) ×1 IMPLANT
SUT VIC AB 3-0 SH 27 (SUTURE) ×2
SUT VIC AB 3-0 SH 27X BRD (SUTURE) ×1 IMPLANT
SYR 20ML LL LF (SYRINGE) ×3 IMPLANT
SYR 3ML LL SCALE MARK (SYRINGE) ×3 IMPLANT

## 2019-05-31 NOTE — Discharge Instructions (Signed)
AV Fistula Placement, Care After This sheet gives you information about how to care for yourself after your procedure. Your health care provider may also give you more specific instructions. If you have problems or questions, contact your health care provider. What can I expect after the procedure? After the procedure, it is common to:  Feel sore.  Feel a vibration (thrill) over the fistula. Follow these instructions at home: Incision care      Follow instructions from your health care provider about how to take care of your incision. Make sure you: ? Wash your hands with soap and water before and after you change your bandage (dressing). If soap and water are not available, use hand sanitizer. ? Change your dressing as told by your health care provider. ? Leave stitches (sutures), skin glue, or adhesive strips in place. These skin closures may need to stay in place for 2 weeks or longer. If adhesive strip edges start to loosen and curl up, you may trim the loose edges. Do not remove adhesive strips completely unless your health care provider tells you to do that. Fistula care  Check your fistula site every day to make sure the thrill feels the same.  Check your fistula site every day for signs of infection. Check for: ? Redness, swelling, or pain. ? Fluid or blood. ? Warmth. ? Pus or a bad smell.  Raise (elevate) the affected area above the level of your heart while you are sitting or lying down.  Do not lift anything that is heavier than 10 lb (4.5 kg), or the limit that you are told, until your health care provider says that it is safe.  Do not lie down on your fistula arm.  Do not let anyone draw blood or take a blood pressure reading on your fistula arm. This is important.  Do not wear tight jewelry or clothing over your fistula arm. Bathing  Do not take baths, swim, or use a hot tub until your health care provider approves. Ask your health care provider if you may take  showers. You may only be allowed to take sponge baths.  Keep the area around your incision clean and dry. Medicines  Take over-the-counter and prescription medicines only as told by your health care provider.  Ask your health care provider if any medicine prescribed to you can cause constipation. You may need to take steps to prevent or treat constipation, such as: ? Drink enough fluid to keep your urine pale yellow. ? Take over-the-counter or prescription medicines. ? Eat foods that are high in fiber, such as beans, whole grains, and fresh fruits and vegetables. ? Limit foods that are high in fat and processed sugars, such as fried or sweet foods. General instructions  Rest at home for a day or two.  Return to your normal activities as told by your health care provider. Ask your health care provider what activities are safe for you.  Keep all follow-up visits as told by your health care provider. This is important. Contact a health care provider if:  You have redness, swelling, or pain around your fistula site.  Your fistula site feels warm to the touch.  You have pus or a bad smell coming from your fistula site.  You have a fever.  You have numbness or coldness at your fistula site.  You feel a decrease or a change in the thrill. Get help right away if you:  Are bleeding from your fistula site.  Have chest  pain.  Have trouble breathing. Summary  Follow instructions from your health care provider about how to take care of your incision.  Do not let anyone draw blood or take a blood pressure reading on your fistula arm. This is important.  Return to your normal activities as told by your health care provider. Ask your health care provider what activities are safe for you.  Contact a health care provider if you have a change in the thrill or have any signs of infection at your fistula site.  Keep all follow-up visits as told by your health care provider. This is  important. This information is not intended to replace advice given to you by your health care provider. Make sure you discuss any questions you have with your health care provider. Document Revised: 07/28/2018 Document Reviewed: 08/15/2017 Elsevier Patient Education  2020 [place]   1) The drugs that you were given will stay in your system until tomorrow so for the next 24 hours you should not:  A) Drive an automobile B) Make any legal decisions C) Drink any alcoholic beverage   2) You may resume regular meals tomorrow.  Today it is better to start with liquids and gradually work up to solid foods.  You may eat anything you prefer, but it is better to start with liquids, then soup and crackers, and gradually work up to solid foods.   3) Please notify your doctor immediately if you have any unusual bleeding, trouble breathing, redness and pain at the surgery site, drainage, fever, or pain not relieved by medication.   4) Additional Instructions:   Please contact your physician with any problems or Same Day Surgery at [phone], Monday through Friday 6 am to 4 pm, or [place] [facility] number at [phone].

## 2019-05-31 NOTE — Transfer of Care (Signed)
Immediate Anesthesia Transfer of Care Note  Patient: [name].  Procedure(s) Performed: Brachiocephalic AV fistula creation (Right )  Patient Location: PACU  Anesthesia Type:General  Level of Consciousness: awake, alert  and oriented  Airway & Oxygen Therapy: Patient Spontanous Breathing and Patient connected to face mask oxygen  Post-op Assessment: Report given to RN and Post -op Vital signs reviewed and stable  Post vital signs: Reviewed and stable  Last Vitals:  Vitals Value Taken Time  BP 190/89 05/31/19 0938  Temp    Pulse 78 05/31/19 0938  Resp 15 05/31/19 0938  SpO2 100 % 05/31/19 0938  Vitals shown include unvalidated device data.  Last Pain:  Vitals:   05/31/19 0653  TempSrc: Temporal  PainSc: 0-No pain         Complications: No apparent anesthesia complications

## 2019-05-31 NOTE — Op Note (Signed)
[facility]   OPERATIVE NOTE   PROCEDURE: Right brachiocephalic arteriovenous fistula placement  PRE-OPERATIVE DIAGNOSIS: 1.  ESRD        POST-OPERATIVE DIAGNOSIS: 1. ESRD       SURGEON: [name] Pain, MD  ASSISTANT(S): Hezzie Bump, PA-C  ANESTHESIA: general  ESTIMATED BLOOD LOSS: 10 cc  FINDING(S): Adequate cephalic vein for fistula creation  SPECIMEN(S):  none  INDICATIONS:   [name]. is a 49 y.o. male who presents with renal failure in need of pemanent dialysis acces.  The patient is scheduled for right arm AVF placement.  The patient is aware the risks include but are not limited to: bleeding, infection, steal syndrome, nerve damage, ischemic monomelic neuropathy, failure to mature, and need for additional procedures.  The patient is aware of the risks of the procedure and elects to proceed forward. An assistant was present during the procedure to help facilitate the exposure and expedite the procedure.  DESCRIPTION: After full informed written consent was obtained from the patient, the patient was brought back to the operating room and placed supine upon the operating table.  Prior to induction, the patient received IV antibiotics.   After obtaining adequate anesthesia, the patient was then prepped and draped in the standard fashion for a right arm access procedure. The assistant provided retraction and mobilization to help facilitate exposure and expedite the procedure throughout the entire procedure.  This included following suture, using retractors, and optimizing lighting. I made a curvilinear incision at the level of the antecubital fossa and dissected through the subcutaneous tissue and fascia to gain exposure of the brachial artery.  This was noted to be patent and adequate in size for fistula creation.  This was dissected out proximally and distally and prepared for control with vessel loops .  I then dissected out the cephalic vein.  This  was noted to be patent and adequate in size for fistula creation.  I then gave the patient 3000 units of intravenous heparin.  The vein was marked for orientation and the distal segment of the vein was ligated with a  2-0 silk, and the vein was transected.  I then instilled the heparinized saline into the vein and clamped it.  At this point, I reset my exposure of the brachial artery and pulled up control on the vessel loops.  I made an arteriotomy with a #11 blade, and then I extended the arteriotomy with a Potts scissor.  I injected heparinized saline proximal and distal to this arteriotomy.  The vein was then sewn to the artery in an end-to-side configuration with a running stitch of 6-0 Prolene.  Prior to completing this anastomosis, I allowed the vein and artery to backbleed.  There was no evidence of clot from any vessels.  I completed the anastomosis in the usual fashion and then released all vessel loops and clamps.  There was a palpable  thrill in the venous outflow, and there was a palpable pulse in the artery distal to the anastomosis.  At this point, I irrigated out the surgical wound.  Surgicel was placed. There was no further active bleeding.  The subcutaneous tissue was reapproximated with a running stitch of 3-0 Vicryl.  The skin was then closed with a 4-0 Monocryl suture.  The skin was then cleaned, dried, and reinforced with Dermabond.  The patient tolerated this procedure well and was taken to the recovery room in stable condition  COMPLICATIONS: None  CONDITION: Stable   [name]  05/31/2019, 9:23 AM  This note was created with Dragon Medical transcription system. Any errors in dictation are purely unintentional.

## 2019-05-31 NOTE — H&P (Signed)
[facility] History & Physical Update  The patient was interviewed and re-examined.  The patient's previous History and Physical has been reviewed and is unchanged.  There is no change in the plan of care. We plan to proceed with the scheduled procedure.  [name], MD  05/31/2019, 7:34 AM

## 2019-05-31 NOTE — Anesthesia Procedure Notes (Signed)
Procedure Name: LMA Insertion Date/Time: 05/31/2019 8:24 AM Performed by: Willette Alma, CRNA Pre-anesthesia Checklist: Patient identified, Patient being monitored, Timeout performed, Emergency Drugs available and Suction available Patient Re-evaluated:Patient Re-evaluated prior to induction Oxygen Delivery Method: Circle system utilized Preoxygenation: Pre-oxygenation with 100% oxygen Induction Type: IV induction Ventilation: Mask ventilation without difficulty LMA: LMA inserted LMA Size: 5.0 Tube type: Oral Number of attempts: 1 Placement Confirmation: positive ETCO2 and breath sounds checked- equal and bilateral Tube secured with: Tape Dental Injury: Teeth and Oropharynx as per pre-operative assessment

## 2019-05-31 NOTE — Therapy (Signed)
[facility] [address] [place], [place], [address] Phone: [phone]   Fax:  [phone]  Patient Details  Name: [name]. MRN: [id number] Date of Birth: 1971/01/08 Referring Provider:  No ref. provider found  Encounter Date: 05/31/2019   Per no show policy remaining appointments have been cancelled due to 3 no shows.  Unable to talk to pt but have left voice message with contact information for questions/concerns.  Ihor Austin, LPTA/CLT; CBIS [phone]  Aldona Lento 05/31/2019, 9:51 AM  [facility] [place], [place], [address] Phone: [phone]   Fax:  [phone]

## 2019-05-31 NOTE — Anesthesia Postprocedure Evaluation (Signed)
Anesthesia Post Note  Patient: [name].  Procedure(s) Performed: Brachiocephalic AV fistula creation (Right )  Patient location during evaluation: PACU Anesthesia Type: General Level of consciousness: awake and alert Pain management: pain level controlled Vital Signs Assessment: post-procedure vital signs reviewed and stable Respiratory status: spontaneous breathing, nonlabored ventilation and respiratory function stable Cardiovascular status: blood pressure returned to baseline and stable Postop Assessment: no apparent nausea or vomiting Anesthetic complications: no     Last Vitals:  Vitals:   05/31/19 1023 05/31/19 1043  BP: (!) 142/74   Pulse: 78 (P) 80  Resp: 18 (P) 12  Temp: 37.1 C (P) 36.7 C  SpO2: 97% (P) 100%    Last Pain:  Vitals:   05/31/19 1043  TempSrc: (P) Temporal  PainSc: (P) 0-No pain                 Alphonsus Sias

## 2019-05-31 NOTE — Anesthesia Preprocedure Evaluation (Addendum)
Anesthesia Evaluation  Patient identified by MRN, date of birth, ID band Patient awake    Reviewed: Allergy & Precautions, H&P , NPO status , reviewed documented beta blocker date and time   Airway Mallampati: II  TM Distance: >3 FB Neck ROM: full    Dental  (+) Poor Dentition, Chipped, Missing Very poor dentition:   Pulmonary    Pulmonary exam normal        Cardiovascular hypertension, +CHF  Normal cardiovascular exam  5/19 ECHO Study Conclusions   - Left ventricle: The cavity size was normal. There was severe  concentric hypertrophy. Systolic function was vigorous. The  estimated ejection fraction was 75%. Wall motion was normal;  there were no regional wall motion abnormalities. Doppler  parameters are consistent with abnormal left ventricular  relaxation (grade 1 diastolic dysfunction).  - Aortic valve: Valve area (Vmax): 3.6 cm^2.  - Mitral valve: There was mild regurgitation.    Neuro/Psych PSYCHIATRIC DISORDERS Anxiety    GI/Hepatic GERD  Medicated and Controlled,  Endo/Other  diabetes  Renal/GU Renal disease     Musculoskeletal   Abdominal   Peds  Hematology   Anesthesia Other Findings Past Medical History: No date: Anxiety No date: Cellulitis No date: CHF (congestive heart failure) (HCC) No date: Chronic diarrhea No date: Chronic kidney disease No date: Chronic pain of both ankles No date: Diabetes mellitus without complication (HCC) No date: Diabetes mellitus, type II (HCC) No date: Diabetic neuropathy (HCC) No date: Frequent falls No date: Gait instability No date: GERD (gastroesophageal reflux disease) No date: Heart palpitations No date: History of kidney stones No date: HLD (hyperlipidemia) No date: Hypertension No date: Insomnia No date: Nausea and vomiting in adult     Comment:  recurrent Past Surgical History: 03/16/2016: AMPUTATION; Left     Comment:  Procedure:  AMPUTATION DIGIT LEFT HALLUX;  Surgeon:               Trula Slade, DPM;  Location: [facility];  Service:               Podiatry;  Laterality: Left;  can start around 5  No date: ARTHROSCOPIC REPAIR ACL; Left 10/28/2015: COLONOSCOPY WITH PROPOFOL; N/A     Comment:  Procedure: COLONOSCOPY WITH PROPOFOL;  Surgeon: Lollie Sails, MD;  Location: [facility] ENDOSCOPY;  Service:               Endoscopy;  Laterality: N/A; 10/29/2015: COLONOSCOPY WITH PROPOFOL; N/A     Comment:  Procedure: COLONOSCOPY WITH PROPOFOL;  Surgeon: Lollie Sails, MD;  Location: [facility] ENDOSCOPY;  Service:               Endoscopy;  Laterality: N/A; 04/26/2019: DIALYSIS/PERMA CATHETER INSERTION; Right     Comment:  Perm Cath  04/26/2019: DIALYSIS/PERMA CATHETER INSERTION; N/A     Comment:  Procedure: DIALYSIS/PERMA CATHETER INSERTION;  Surgeon:               Algernon Huxley, MD;  Location: [facility];                Service: Cardiovascular;  Laterality: N/A; 12/27/2017: ESOPHAGOGASTRODUODENOSCOPY (EGD) WITH PROPOFOL; N/A     Comment:  Procedure: ESOPHAGOGASTRODUODENOSCOPY (EGD) WITH               PROPOFOL;  Surgeon: [place], [place],  MD;  Location:               [name] ENDOSCOPY;  Service: Gastroenterology;  Laterality:               N/A; 2016: PROSTATE SURGERY No date: TONSILECTOMY/ADENOIDECTOMY WITH MYRINGOTOMY No date: TONSILLECTOMY BMI    Body Mass Index: 26.25 kg/m     Reproductive/Obstetrics                            Anesthesia Physical Anesthesia Plan  ASA: III  Anesthesia Plan: General   Post-op Pain Management:    Induction: Intravenous  PONV Risk Score and Plan: Ondansetron and Treatment may vary due to age or medical condition  Airway Management Planned: LMA  Additional Equipment:   Intra-op Plan:   Post-operative Plan: Extubation in OR  Informed Consent: I have reviewed the patients History and Physical, chart, labs and discussed the  procedure including the risks, benefits and alternatives for the proposed anesthesia with the patient or authorized representative who has indicated his/her understanding and acceptance.     Dental Advisory Given  Plan Discussed with: CRNA  Anesthesia Plan Comments: (Pt refuses regional block)        Anesthesia Quick Evaluation

## 2019-05-31 NOTE — Telephone Encounter (Signed)
No show #3, called and left message concerning missed apt.  Educated no show policy and have cancelled remaining apts.  Encouraged pt to call MD for another referral if wishes to return to therapy with contact number given.  Ihor Austin, LPTA/CLT; Delana Meyer [phone]

## 2019-06-04 ENCOUNTER — Encounter: Payer: Self-pay | Admitting: Emergency Medicine

## 2019-06-04 ENCOUNTER — Emergency Department: Payer: Medicare (Managed Care)

## 2019-06-04 ENCOUNTER — Observation Stay
Admission: EM | Admit: 2019-06-04 | Discharge: 2019-06-05 | Disposition: A | Discharge status: Home / Self Care | Payer: Medicare (Managed Care) | Attending: Internal Medicine | Admitting: Internal Medicine

## 2019-06-04 ENCOUNTER — Telehealth (INDEPENDENT_AMBULATORY_CARE_PROVIDER_SITE_OTHER): Payer: Self-pay

## 2019-06-04 ENCOUNTER — Other Ambulatory Visit: Payer: Self-pay

## 2019-06-04 DIAGNOSIS — N186 End stage renal disease: Secondary | ICD-10-CM | POA: Insufficient documentation

## 2019-06-04 DIAGNOSIS — E782 Mixed hyperlipidemia: Secondary | ICD-10-CM | POA: Diagnosis not present

## 2019-06-04 DIAGNOSIS — E11319 Type 2 diabetes mellitus with unspecified diabetic retinopathy without macular edema: Secondary | ICD-10-CM | POA: Diagnosis not present

## 2019-06-04 DIAGNOSIS — I132 Hypertensive heart and chronic kidney disease with heart failure and with stage 5 chronic kidney disease, or end stage renal disease: Secondary | ICD-10-CM | POA: Diagnosis not present

## 2019-06-04 DIAGNOSIS — E1140 Type 2 diabetes mellitus with diabetic neuropathy, unspecified: Secondary | ICD-10-CM | POA: Diagnosis not present

## 2019-06-04 DIAGNOSIS — E1122 Type 2 diabetes mellitus with diabetic chronic kidney disease: Secondary | ICD-10-CM | POA: Diagnosis not present

## 2019-06-04 DIAGNOSIS — Z89412 Acquired absence of left great toe: Secondary | ICD-10-CM | POA: Diagnosis not present

## 2019-06-04 DIAGNOSIS — Z9181 History of falling: Secondary | ICD-10-CM | POA: Insufficient documentation

## 2019-06-04 DIAGNOSIS — K219 Gastro-esophageal reflux disease without esophagitis: Secondary | ICD-10-CM | POA: Diagnosis not present

## 2019-06-04 DIAGNOSIS — Z79899 Other long term (current) drug therapy: Secondary | ICD-10-CM | POA: Insufficient documentation

## 2019-06-04 DIAGNOSIS — F419 Anxiety disorder, unspecified: Secondary | ICD-10-CM | POA: Insufficient documentation

## 2019-06-04 DIAGNOSIS — Z794 Long term (current) use of insulin: Secondary | ICD-10-CM | POA: Diagnosis not present

## 2019-06-04 DIAGNOSIS — I10 Essential (primary) hypertension: Secondary | ICD-10-CM | POA: Diagnosis present

## 2019-06-04 DIAGNOSIS — D649 Anemia, unspecified: Principal | ICD-10-CM | POA: Insufficient documentation

## 2019-06-04 DIAGNOSIS — E119 Type 2 diabetes mellitus without complications: Secondary | ICD-10-CM

## 2019-06-04 DIAGNOSIS — Z992 Dependence on renal dialysis: Secondary | ICD-10-CM | POA: Diagnosis not present

## 2019-06-04 DIAGNOSIS — Z20822 Contact with and (suspected) exposure to covid-19: Secondary | ICD-10-CM | POA: Insufficient documentation

## 2019-06-04 DIAGNOSIS — I5032 Chronic diastolic (congestive) heart failure: Secondary | ICD-10-CM | POA: Insufficient documentation

## 2019-06-04 LAB — CBC
HCT: 22.1 % — ABNORMAL LOW (ref 39.0–52.0)
Hemoglobin: 6.9 g/dL — ABNORMAL LOW (ref 13.0–17.0)
MCH: 24 pg — ABNORMAL LOW (ref 26.0–34.0)
MCHC: 31.2 g/dL (ref 30.0–36.0)
MCV: 76.7 fL — ABNORMAL LOW (ref 80.0–100.0)
Platelets: 279 10*3/uL (ref 150–400)
RBC: 2.88 MIL/uL — ABNORMAL LOW (ref 4.22–5.81)
RDW: 14.7 % (ref 11.5–15.5)
WBC: 8.2 10*3/uL (ref 4.0–10.5)
nRBC: 0 % (ref 0.0–0.2)

## 2019-06-04 LAB — BASIC METABOLIC PANEL
Anion gap: 10 (ref 5–15)
BUN: 26 mg/dL — ABNORMAL HIGH (ref 6–20)
CO2: 30 mmol/L (ref 22–32)
Calcium: 8.2 mg/dL — ABNORMAL LOW (ref 8.9–10.3)
Chloride: 99 mmol/L (ref 98–111)
Creatinine, Ser: 5.24 mg/dL — ABNORMAL HIGH (ref 0.61–1.24)
GFR calc Af Amer: 14 mL/min — ABNORMAL LOW (ref >60)
GFR calc non Af Amer: 12 mL/min — ABNORMAL LOW (ref >60)
Glucose, Bld: 193 mg/dL — ABNORMAL HIGH (ref 70–99)
Potassium: 2.9 mmol/L — ABNORMAL LOW (ref 3.5–5.1)
Sodium: 139 mmol/L (ref 135–145)

## 2019-06-04 LAB — TROPONIN I (HIGH SENSITIVITY): Troponin I (High Sensitivity): 14 ng/L (ref <18)

## 2019-06-04 NOTE — ED Provider Notes (Signed)
[facility] Emergency Department Provider Note  ____________________________________________   I have reviewed the triage vital signs and the nursing notes.   HISTORY  Chief Complaint Weakness and Abnormal Lab   History limited by: Not Limited   HPI [name]. is a 49 y.o. male who presents to the emergency department today because of concern for low blood level found at dialysis. The patient states that for roughly the past month he has felt weak, dizzy and has had some shortness of breath. He has not noticed any bloody or black stool or bloody urine. The patient says he was told he has had low blood levels in the past but has not been told why. Recently had fistula placed in right upper arm.    Records reviewed. Per medical record review patient has a history of esrd on dialysis. Anemia.   Past Medical History:  Diagnosis Date  . Anxiety   . Cellulitis   . CHF (congestive heart failure) ([place])   . Chronic diarrhea   . Chronic kidney disease   . Chronic pain of both ankles   . Diabetes mellitus without complication ([place])   . Diabetes mellitus, type II ([place])   . Diabetic neuropathy ([place])   . Frequent falls   . Gait instability   . GERD (gastroesophageal reflux disease)   . Heart palpitations   . History of kidney stones   . HLD (hyperlipidemia)   . Hypertension   . Insomnia   . Nausea and vomiting in adult    recurrent    Patient Active Problem List   Diagnosis Date Noted  . Lymphedema 01/09/2018  . Chronic diastolic heart failure ([place]) 12/13/2017  . Vomiting 11/28/2017  . Acute on chronic heart failure ([place]) 11/23/2017  . Type 2 diabetes mellitus with stage 4 chronic kidney disease, with long-term current use of insulin ([place]) 11/23/2017  . Diabetic retinopathy ([place]) 07/05/2017  . Uncontrolled diabetes mellitus ([place]) 07/08/2016  . Uncontrolled type 2 diabetes mellitus with hyperglycemia, with long-term current use of insulin ([place])  04/20/2016  . Osteomyelitis of toe of left foot ([place]) 03/15/2016  . Acute on chronic renal failure ([place]) 06/23/2015  . Diarrhea 06/23/2015  . Mixed hyperlipidemia   . Essential hypertension, benign   . Heart palpitations 05/06/2015    Past Surgical History:  Procedure Laterality Date  . AMPUTATION Left 03/16/2016   Procedure: AMPUTATION DIGIT LEFT HALLUX;  Surgeon: Trula Slade, DPM;  Location: [facility];  Service: Podiatry;  Laterality: Left;  can start around 5   . ARTHROSCOPIC REPAIR ACL Left   . AV FISTULA PLACEMENT Right 05/31/2019   Procedure: Brachiocephalic AV fistula creation;  Surgeon: Algernon Huxley, MD;  Location: [name] ORS;  Service: Vascular;  Laterality: Right;  . COLONOSCOPY WITH PROPOFOL N/A 10/28/2015   Procedure: COLONOSCOPY WITH PROPOFOL;  Surgeon: Lollie Sails, MD;  Location: [facility] ENDOSCOPY;  Service: Endoscopy;  Laterality: N/A;  . COLONOSCOPY WITH PROPOFOL N/A 10/29/2015   Procedure: COLONOSCOPY WITH PROPOFOL;  Surgeon: Lollie Sails, MD;  Location: [facility] ENDOSCOPY;  Service: Endoscopy;  Laterality: N/A;  . DIALYSIS/PERMA CATHETER INSERTION Right 04/26/2019   [facility]   . DIALYSIS/PERMA CATHETER INSERTION N/A 04/26/2019   Procedure: DIALYSIS/PERMA CATHETER INSERTION;  Surgeon: Algernon Huxley, MD;  Location: [facility];  Service: Cardiovascular;  Laterality: N/A;  . ESOPHAGOGASTRODUODENOSCOPY (EGD) WITH PROPOFOL N/A 12/27/2017   Procedure: ESOPHAGOGASTRODUODENOSCOPY (EGD) WITH PROPOFOL;  Surgeon: Toledo, Benay Pike, MD;  Location: [name] ENDOSCOPY;  Service: Gastroenterology;  Laterality: N/A;  . PROSTATE SURGERY  2016  . TONSILECTOMY/ADENOIDECTOMY WITH MYRINGOTOMY    . TONSILLECTOMY      Prior to Admission medications   Medication Sig Start Date End Date Taking? Authorizing Provider  calcium citrate-vitamin D (CITRACAL+D) 315-200 MG-UNIT tablet Take 2 tablets by mouth daily.     [provider]  carvedilol (COREG) 12.5 MG tablet Take 12.5 mg by  mouth daily.  09/20/17   [provider]  citalopram (CELEXA) 20 MG tablet Take 20 mg by mouth daily.  06/16/17   [provider]  fluticasone (FLONASE) 50 MCG/ACT nasal spray Place 1 spray into both nostrils daily. 04/01/18   [provider]  HYDROcodone-acetaminophen (NORCO) 5-325 MG tablet Take 1 tablet by mouth every 6 (six) hours as needed for moderate pain. 05/31/19   Algernon Huxley, MD  Insulin Pen Needle (B-D ULTRAFINE III SHORT PEN) 31G X 8 MM MISC 1 each by Does not apply route as directed. 01/25/19   Cassandria Anger, MD  linagliptin (TRADJENTA) 5 MG TABS tablet Take 1 tablet (5 mg total) by mouth daily. 04/25/19   Cassandria Anger, MD  lisinopril (PRINIVIL,ZESTRIL) 20 MG tablet Take 20 mg by mouth 2 (two) times daily.  10/04/17   [provider]  metoCLOPramide (REGLAN) 10 MG tablet Take 1 tablet (10 mg total) by mouth 2 (two) times daily at 8 am and 10 pm. 01/17/19   Lucilla Lame, MD  omeprazole (PRILOSEC) 40 MG capsule Take 80 mg by mouth 2 (two) times a day.  12/27/17   [provider]  ondansetron (ZOFRAN-ODT) 4 MG disintegrating tablet Take 4 mg by mouth every 8 (eight) hours as needed for nausea or vomiting.  10/13/17   [provider]  rosuvastatin (CRESTOR) 40 MG tablet Take 40 mg by mouth daily.  05/26/17   [provider]  tamsulosin (FLOMAX) 0.4 MG CAPS capsule Take 0.4 mg by mouth daily after breakfast.     [provider]    Allergies Levofloxacin, Phenergan [promethazine hcl], Malt, and Soy allergy  Family History  Problem Relation Age of Onset  . CAD Father   . Stroke Father   . Diabetes Mellitus II Mother   . Kidney failure Mother   . Schizophrenia Mother     Social History Social History   Tobacco Use  . Smoking status: Never Smoker  . Smokeless tobacco: Never Used  Substance Use Topics  . Alcohol use: No  . Drug use: No    Review of Systems Constitutional: No fever/chills. Positive  for generalized weakness.  Eyes: No visual changes. ENT: No sore throat. Cardiovascular: Denies chest pain. Respiratory: Positive for shortness of breath. Gastrointestinal: No abdominal pain.  No nausea, no vomiting.  No diarrhea.   Genitourinary: Negative for dysuria. Musculoskeletal: Negative for back pain. Skin: Negative for rash. Neurological: Positive for dizziness. ____________________________________________   PHYSICAL EXAM:  VITAL SIGNS: ED Triage Vitals  Enc Vitals Group     BP 06/04/19 2022 (!) 118/58     Pulse Rate 06/04/19 2022 78     Resp 06/04/19 2022 20     Temp 06/04/19 2022 98.7 F (37.1 C)     Temp Source 06/04/19 2022 Oral     SpO2 06/04/19 2022 100 %     Weight 06/04/19 2023 210 lb (95.3 kg)     Height 06/04/19 2023 6\' 3"  (1.905 m)     Head Circumference --  Peak Flow --      Pain Score 06/04/19 2023 10   Constitutional: Alert and oriented.  Eyes: Conjunctivae are normal.  ENT      Head: Normocephalic and atraumatic.      Nose: No congestion/rhinnorhea.      Mouth/Throat: Mucous membranes are moist.      Neck: No stridor. Hematological/Lymphatic/Immunilogical: No cervical lymphadenopathy. Cardiovascular: Normal rate, regular rhythm.  No murmurs, rubs, or gallops.  Respiratory: Normal respiratory effort without tachypnea nor retractions. Breath sounds are clear and equal bilaterally. No wheezes/rales/rhonchi. Gastrointestinal: Soft and non tender. No rebound. No guarding.  Rectal: GUIAC negative. Musculoskeletal: Normal range of motion in all extremities. No lower extremity edema. Neurologic:  Normal speech and language. No gross focal neurologic deficits are appreciated.  Skin:  Skin is warm, dry and intact. No rash noted. Psychiatric: Mood and affect are normal. Speech and behavior are normal. Patient exhibits appropriate insight and judgment.  ____________________________________________    LABS (pertinent positives/negatives)  Trop hs  14 CBC wbc 8.2, hgb 6.9, plt 279 BMP na 139, k 2.9, glu 193, cr 5.24  ____________________________________________   EKG  I, Nance Pear, attending physician, personally viewed and interpreted this EKG  EKG Time: 2058 Rate: 71 Rhythm: normal sinus rhythm Axis: left axis deviation Intervals: qtc 477 QRS: LVH ST changes: no st elevation Impression: abnormal ekg ____________________________________________    RADIOLOGY  CXR Mild cardiomegaly. No acute findings.  ____________________________________________   PROCEDURES  Procedures  ____________________________________________   INITIAL IMPRESSION / ASSESSMENT AND PLAN / ED COURSE  Pertinent labs & imaging results that were available during my care of the patient were reviewed by me and considered in my medical decision making (see chart for details).   Patient presented to the ER today because of concern for anemia. Patient with ESRD on dialysis. Has had some symptoms of anemia over the past month. GUIAC was negative. Given anemia discussed blood transfusion with the patient. Will plan on admission.  ____________________________________________   FINAL CLINICAL IMPRESSION(S) / ED DIAGNOSES  Final diagnoses:  Anemia, unspecified type     Note: This dictation was prepared with Dragon dictation. Any transcriptional errors that result from this process are unintentional     Nance Pear, MD 06/05/19 [phone]

## 2019-06-04 NOTE — ED Triage Notes (Signed)
Pt presents to ED with weakness for the past few days and today at dialysis pt reports he was told to come to ED because his blood levels were low and he needed a blood transfusion. Denies chest pain. Does have occasional shortness of breath.

## 2019-06-04 NOTE — ED Notes (Signed)
Pt called with no response.

## 2019-06-04 NOTE — Telephone Encounter (Signed)
Swelling and some numbness are common especially after fistula placement.  He should try to elevate his arm above his heart.  The numbness should subside some in the next few days but if his fingers become cold or start to discolor, he should contact out office so we can bring him in sooner

## 2019-06-04 NOTE — Telephone Encounter (Signed)
Patient called stating his right arm is swollen and numb. Patient had surgery with Dr. Lucky Cowboy on 05/31/19. Patient had a right arm AV fistula placement. Patient was advised to elevate his arm and that swelling and numbness was normal after surgery.

## 2019-06-04 NOTE — Telephone Encounter (Signed)
Patient was called back and given the recommendations from Eulogio Ditch NP.

## 2019-06-04 NOTE — ED Notes (Signed)
Labs and EKG done.

## 2019-06-05 ENCOUNTER — Encounter (HOSPITAL_COMMUNITY): Payer: Self-pay

## 2019-06-05 ENCOUNTER — Ambulatory Visit (HOSPITAL_COMMUNITY): Payer: Medicare (Managed Care)

## 2019-06-05 DIAGNOSIS — D649 Anemia, unspecified: Principal | ICD-10-CM

## 2019-06-05 LAB — BASIC METABOLIC PANEL
Anion gap: 9 (ref 5–15)
BUN: 35 mg/dL — ABNORMAL HIGH (ref 6–20)
CO2: 29 mmol/L (ref 22–32)
Calcium: 8.5 mg/dL — ABNORMAL LOW (ref 8.9–10.3)
Chloride: 107 mmol/L (ref 98–111)
Creatinine, Ser: 6.2 mg/dL — ABNORMAL HIGH (ref 0.61–1.24)
GFR calc Af Amer: 11 mL/min — ABNORMAL LOW (ref >60)
GFR calc non Af Amer: 10 mL/min — ABNORMAL LOW (ref >60)
Glucose, Bld: 90 mg/dL (ref 70–99)
Potassium: 3.6 mmol/L (ref 3.5–5.1)
Sodium: 145 mmol/L (ref 135–145)

## 2019-06-05 LAB — SARS CORONAVIRUS 2 (TAT 6-24 HRS): SARS Coronavirus 2: NEGATIVE

## 2019-06-05 LAB — CBC
HCT: 25.8 % — ABNORMAL LOW (ref 39.0–52.0)
Hemoglobin: 8.2 g/dL — ABNORMAL LOW (ref 13.0–17.0)
MCH: 24.8 pg — ABNORMAL LOW (ref 26.0–34.0)
MCHC: 31.8 g/dL (ref 30.0–36.0)
MCV: 78.2 fL — ABNORMAL LOW (ref 80.0–100.0)
Platelets: 289 10*3/uL (ref 150–400)
RBC: 3.3 MIL/uL — ABNORMAL LOW (ref 4.22–5.81)
RDW: 14.9 % (ref 11.5–15.5)
WBC: 8.9 10*3/uL (ref 4.0–10.5)
nRBC: 0 % (ref 0.0–0.2)

## 2019-06-05 LAB — HEMOGLOBIN AND HEMATOCRIT, BLOOD
HCT: 24.8 % — ABNORMAL LOW (ref 39.0–52.0)
Hemoglobin: 7.6 g/dL — ABNORMAL LOW (ref 13.0–17.0)

## 2019-06-05 LAB — GLUCOSE, CAPILLARY
Glucose-Capillary: 87 mg/dL (ref 70–99)
Glucose-Capillary: 90 mg/dL (ref 70–99)

## 2019-06-05 LAB — PREPARE RBC (CROSSMATCH)

## 2019-06-05 MED ORDER — CARVEDILOL 6.25 MG PO TABS
12.5000 mg | ORAL_TABLET | Freq: Every day | ORAL | Status: DC
  Administered 2019-06-05: 10:00:00 12.5 mg via ORAL
  Filled 2019-06-05: qty 2

## 2019-06-05 MED ORDER — SENNOSIDES-DOCUSATE SODIUM 8.6-50 MG PO TABS: 1.0000 | ORAL_TABLET | Freq: Every evening | ORAL | Status: DC | PRN

## 2019-06-05 MED ORDER — ROSUVASTATIN CALCIUM 20 MG PO TABS
40.0000 mg | ORAL_TABLET | Freq: Every day | ORAL | Status: DC
  Filled 2019-06-05: qty 2

## 2019-06-05 MED ORDER — ONDANSETRON HCL 4 MG PO TABS: 4.0000 mg | ORAL_TABLET | Freq: Four times a day (QID) | ORAL | Status: DC | PRN

## 2019-06-05 MED ORDER — CITALOPRAM HYDROBROMIDE 20 MG PO TABS
20.0000 mg | ORAL_TABLET | Freq: Every day | ORAL | Status: DC
  Administered 2019-06-05: 20 mg via ORAL
  Filled 2019-06-05: qty 1

## 2019-06-05 MED ORDER — SODIUM CHLORIDE 0.9% IV SOLUTION
Freq: Once | INTRAVENOUS | Status: AC
  Filled 2019-06-05: qty 250

## 2019-06-05 MED ORDER — ACETAMINOPHEN 650 MG RE SUPP: 650.0000 mg | Freq: Four times a day (QID) | RECTAL | Status: DC | PRN

## 2019-06-05 MED ORDER — INSULIN ASPART 100 UNIT/ML ~~LOC~~ SOLN: 0.0000 [IU] | Freq: Every day | SUBCUTANEOUS | Status: DC

## 2019-06-05 MED ORDER — ACETAMINOPHEN 325 MG PO TABS: 650.0000 mg | ORAL_TABLET | Freq: Four times a day (QID) | ORAL | Status: DC | PRN

## 2019-06-05 MED ORDER — HYDROCODONE-ACETAMINOPHEN 5-325 MG PO TABS: 1.0000 | ORAL_TABLET | Freq: Four times a day (QID) | ORAL | Status: DC | PRN

## 2019-06-05 MED ORDER — INSULIN ASPART 100 UNIT/ML ~~LOC~~ SOLN: 0.0000 [IU] | Freq: Three times a day (TID) | SUBCUTANEOUS | Status: DC

## 2019-06-05 MED ORDER — SODIUM CHLORIDE 0.9 % IV SOLN
10.0000 mL/h | Freq: Once | INTRAVENOUS | Status: AC
  Administered 2019-06-05: 10 mL/h via INTRAVENOUS

## 2019-06-05 MED ORDER — TAMSULOSIN HCL 0.4 MG PO CAPS
0.4000 mg | ORAL_CAPSULE | Freq: Every day | ORAL | Status: DC
  Administered 2019-06-05: 0.4 mg via ORAL
  Filled 2019-06-05: qty 1

## 2019-06-05 MED ORDER — ONDANSETRON HCL 4 MG/2ML IJ SOLN: 4.0000 mg | Freq: Four times a day (QID) | INTRAMUSCULAR | Status: DC | PRN

## 2019-06-05 MED ORDER — PANTOPRAZOLE SODIUM 40 MG PO TBEC
40.0000 mg | DELAYED_RELEASE_TABLET | Freq: Every day | ORAL | Status: DC
  Administered 2019-06-05: 10:00:00 40 mg via ORAL
  Filled 2019-06-05: qty 1

## 2019-06-05 MED ORDER — LISINOPRIL 10 MG PO TABS
20.0000 mg | ORAL_TABLET | Freq: Two times a day (BID) | ORAL | Status: DC
  Administered 2019-06-05 (×2): 20 mg via ORAL
  Filled 2019-06-05 (×2): qty 2

## 2019-06-05 NOTE — ED Notes (Signed)
Pt states for the last several weeks he has been experiencing weakness and fatigue. States he has been eating food and reports normal Bm, denies blood. Pt states weakness has worsened to point of needing cane or walker while at home which is not his normal self. Pt also is a new dialysis pt and started 2 weeks ago. New fistula is in place on right arm.

## 2019-06-05 NOTE — ED Notes (Signed)
Pt sitting up, ate breakfast.

## 2019-06-05 NOTE — ED Notes (Signed)
Pt did not want to wait on discharge paperwork. Pt stated "He does not have time to wait." Hospitalist made aware.

## 2019-06-05 NOTE — H&P (Signed)
History and Physical    Anchor [name]. EAV:[id number] DOB: 10-Aug-1970 DOA: 06/04/2019  PCP: [name], MD   Patient coming from: Home  I have personally briefly reviewed patient's old medical records in [place]  Chief Complaint: Weakness and dizziness.  Low blood count at dialysis  HPI: [name]. is a 49 y.o. male with medical history significant for ESRD on HD MWF, hypertension, diabetes and chronic diastolic heart failure, who was sent from dialysis because of a low hemoglobin.  Patient states that he had been experiencing weakness shortness of breath and dizziness for the past week or so.  Patient underwent placement of an AV fistula right upper extremity on 05/31/2019.  Hemoglobin at that time was 9.5.  In dialysis hemoglobin was under 7.  Patient denies having black stool or seeing blood in the stool. Has occasional vomiting related to his gastroparesis but no blood and no coffee grounds.  He denied chest pain, palpitations ED Course: Vital signs in the ER were within normal limits.  Hemoglobin was 6.9, down from 9.5 on 05/31/2019.  Stool guaiac negative.  Potassium slightly low at 2.9 but lab work otherwise unremarkable for dialysis patient.  Chest x-ray showed mild cardiomegaly without acute findings.  EKG, normal sinus rhythm with nonspecific ST-T wave changes.  Patient was typed and crossed and started on 1 unit PRBC.  Hospitalist consulted for admission.  Review of Systems: As per HPI otherwise 10 point review of systems negative.    Past Medical History:  Diagnosis Date  . Anxiety   . Cellulitis   . CHF (congestive heart failure) ([place])   . Chronic diarrhea   . Chronic kidney disease   . Chronic pain of both ankles   . Diabetes mellitus without complication ([place])   . Diabetes mellitus, type II ([place])   . Diabetic neuropathy ([place])   . Frequent falls   . Gait instability   . GERD (gastroesophageal reflux disease)   . Heart palpitations   . History  of kidney stones   . HLD (hyperlipidemia)   . Hypertension   . Insomnia   . Nausea and vomiting in adult    recurrent    Past Surgical History:  Procedure Laterality Date  . AMPUTATION Left 03/16/2016   Procedure: AMPUTATION DIGIT LEFT HALLUX;  Surgeon: Trula Slade, DPM;  Location: [place];  Service: Podiatry;  Laterality: Left;  can start around 5   . ARTHROSCOPIC REPAIR ACL Left   . AV FISTULA PLACEMENT Right 05/31/2019   Procedure: Brachiocephalic AV fistula creation;  Surgeon: Algernon Huxley, MD;  Location: [name];  Service: Vascular;  Laterality: Right;  . COLONOSCOPY WITH PROPOFOL N/A 10/28/2015   Procedure: COLONOSCOPY WITH PROPOFOL;  Surgeon: Lollie Sails, MD;  Location: [facility];  Service: Endoscopy;  Laterality: N/A;  . COLONOSCOPY WITH PROPOFOL N/A 10/29/2015   Procedure: COLONOSCOPY WITH PROPOFOL;  Surgeon: Lollie Sails, MD;  Location: [facility] ENDOSCOPY;  Service: Endoscopy;  Laterality: N/A;  . DIALYSIS/PERMA CATHETER INSERTION Right 04/26/2019   [facility]   . DIALYSIS/PERMA CATHETER INSERTION N/A 04/26/2019   Procedure: DIALYSIS/PERMA CATHETER INSERTION;  Surgeon: Algernon Huxley, MD;  Location: [place] [facility];  Service: Cardiovascular;  Laterality: N/A;  . ESOPHAGOGASTRODUODENOSCOPY (EGD) WITH PROPOFOL N/A 12/27/2017   Procedure: ESOPHAGOGASTRODUODENOSCOPY (EGD) WITH PROPOFOL;  Surgeon: Toledo, Benay Pike, MD;  Location: [name] ENDOSCOPY;  Service: Gastroenterology;  Laterality: N/A;  . PROSTATE SURGERY  2016  . TONSILECTOMY/ADENOIDECTOMY WITH MYRINGOTOMY    .  TONSILLECTOMY       reports that he has never smoked. He has never used smokeless tobacco. He reports that he does not drink alcohol or use drugs.  Allergies  Allergen Reactions  . Levofloxacin Swelling    Of face  . Phenergan [Promethazine Hcl] Other (See Comments)    Cramping all over   . Malt Other (See Comments)    Other reaction(s): cramping  . Soy Allergy Other (See Comments)    Other  reaction(s): swelling of face....grew out of it.  No precautions now     Family History  Problem Relation Age of Onset  . CAD Father   . Stroke Father   . Diabetes Mellitus II Mother   . Kidney failure Mother   . Schizophrenia Mother      Prior to Admission medications   Medication Sig Start Date End Date Taking? Authorizing Provider  calcium citrate-vitamin D (CITRACAL+D) 315-200 MG-UNIT tablet Take 2 tablets by mouth daily.    Yes [provider]  carvedilol (COREG) 12.5 MG tablet Take 12.5 mg by mouth daily.  09/20/17  Yes [provider]  citalopram (CELEXA) 20 MG tablet Take 20 mg by mouth daily.  06/16/17  Yes [provider]  fluticasone (FLONASE) 50 MCG/ACT nasal spray Place 1 spray into both nostrils daily. 04/01/18  Yes [provider]  HYDROcodone-acetaminophen (NORCO) 5-325 MG tablet Take 1 tablet by mouth every 6 (six) hours as needed for moderate pain. 05/31/19  Yes Dew, Erskine Squibb, MD  Insulin Pen Needle (B-D ULTRAFINE III SHORT PEN) 31G X 8 MM MISC 1 each by Does not apply route as directed. 01/25/19  Yes Cassandria Anger, MD  linagliptin (TRADJENTA) 5 MG TABS tablet Take 1 tablet (5 mg total) by mouth daily. 04/25/19  Yes Nida, Marella Chimes, MD  lisinopril (PRINIVIL,ZESTRIL) 20 MG tablet Take 20 mg by mouth 2 (two) times daily.  10/04/17  Yes [provider]  metoCLOPramide (REGLAN) 10 MG tablet Take 1 tablet (10 mg total) by mouth 2 (two) times daily at 8 am and 10 pm. 01/17/19  Yes Lucilla Lame, MD  omeprazole (PRILOSEC) 40 MG capsule Take 80 mg by mouth 2 (two) times a day.  12/27/17  Yes [provider]  ondansetron (ZOFRAN-ODT) 4 MG disintegrating tablet Take 4 mg by mouth every 8 (eight) hours as needed for nausea or vomiting.  10/13/17  Yes [provider]  rosuvastatin (CRESTOR) 40 MG tablet Take 40 mg by mouth daily.  05/26/17  Yes [provider]  tamsulosin (FLOMAX) 0.4 MG CAPS capsule Take 0.4 mg  by mouth daily after breakfast.    Yes [provider]    Physical Exam: Vitals:   06/04/19 2022 06/04/19 2023 06/05/19 0010  BP: (!) 118/58  (!) 157/79  Pulse: 78  85  Resp: 20  20  Temp: 98.7 F (37.1 C)    TempSrc: Oral    SpO2: 100%  100%  Weight:  95.3 kg   Height:  6\' 3"  (1.905 m)      Vitals:   06/04/19 2022 06/04/19 2023 06/05/19 0010  BP: (!) 118/58  (!) 157/79  Pulse: 78  85  Resp: 20  20  Temp: 98.7 F (37.1 C)    TempSrc: Oral    SpO2: 100%  100%  Weight:  95.3 kg   Height:  6\' 3"  (1.905 m)     Constitutional: Alert and awake, oriented x3, not in any acute distress. Eyes: PERLA, EOMI,  irises appear normal, anicteric sclera,  ENMT: external ears and nose appear normal, normal hearing             Lips appears normal, oropharynx mucosa, tongue, posterior pharynx appear normal  Neck: neck appears normal, no masses, normal ROM, no thyromegaly, no JVD  CVS: S1-S2 clear, no murmur rubs or gallops,  , no carotid bruits, pedal pulses palpable, No LE edema Respiratory:  clear to auscultation bilaterally, no wheezing, rales or rhonchi. Respiratory effort normal. No accessory muscle use.  Abdomen: soft nontender, nondistended, normal bowel sounds, no hepatosplenomegaly, no hernias Musculoskeletal: : no cyanosis, clubbing , no contractures or atrophy Neuro: Cranial nerves II-XII intact, sensation, reflexes normal, strength Psych: judgement and insight appear normal, stable mood and affect,  Skin: no rashes or lesions or ulcers, no induration or nodules   Labs on Admission: I have personally reviewed following labs and imaging studies  CBC: Recent Labs  Lab 05/29/19 0857 05/31/19 0701 06/04/19 2045  WBC 6.6  --  8.2  HGB 7.4* 9.5* 6.9*  HCT 24.8* 28.0* 22.1*  MCV 79.7*  --  76.7*  PLT 254  --  867   Basic Metabolic Panel: Recent Labs  Lab 05/29/19 0838 05/31/19 0701 06/04/19 2045  NA 141 138 139  K 4.1 3.6 2.9*  CL 111 101 99  CO2 18*  --  30   GLUCOSE 89 90 193*  BUN 75* 28* 26*  CREATININE 9.80* 5.50* 5.24*  CALCIUM 8.6*  --  8.2*   GFR: Estimated Creatinine Clearance: 19.9 mL/min (A) (by C-G formula based on SCr of 5.24 mg/dL (H)). Liver Function Tests: No results for input(s): AST, ALT, ALKPHOS, BILITOT, PROT, ALBUMIN in the last 168 hours. No results for input(s): LIPASE, AMYLASE in the last 168 hours. No results for input(s): AMMONIA in the last 168 hours. Coagulation Profile: Recent Labs  Lab 05/29/19 0838  INR 1.1   Cardiac Enzymes: No results for input(s): CKTOTAL, CKMB, CKMBINDEX, TROPONINI in the last 168 hours. BNP (last 3 results) No results for input(s): PROBNP in the last 8760 hours. HbA1C: No results for input(s): HGBA1C in the last 72 hours. CBG: Recent Labs  Lab 05/31/19 0718 05/31/19 0942 05/31/19 1031  GLUCAP 88 76 110*   Lipid Profile: No results for input(s): CHOL, HDL, LDLCALC, TRIG, CHOLHDL, LDLDIRECT in the last 72 hours. Thyroid Function Tests: No results for input(s): TSH, T4TOTAL, FREET4, T3FREE, THYROIDAB in the last 72 hours. Anemia Panel: No results for input(s): VITAMINB12, FOLATE, FERRITIN, TIBC, IRON, RETICCTPCT in the last 72 hours. Urine analysis:    Component Value Date/Time   COLORURINE YELLOW (A) 11/07/2017 1855   APPEARANCEUR CLOUDY (A) 11/07/2017 1855   LABSPEC 1.016 11/07/2017 1855   PHURINE 5.0 11/07/2017 1855   GLUCOSEU >=500 (A) 11/07/2017 1855   HGBUR MODERATE (A) 11/07/2017 1855   BILIRUBINUR NEGATIVE 11/07/2017 1855   KETONESUR NEGATIVE 11/07/2017 1855   PROTEINUR >=300 (A) 11/07/2017 1855   NITRITE NEGATIVE 11/07/2017 1855   LEUKOCYTESUR NEGATIVE 11/07/2017 1855    Radiological Exams on Admission: DG Chest 2 View  Result Date: 06/04/2019 CLINICAL DATA:  Shortness of breath EXAM: CHEST - 2 VIEW COMPARISON:  09/13/2018 FINDINGS: Mild cardiomegaly. Right chest multi lumen vascular catheter. Both lungs are clear. The visualized skeletal structures are  unremarkable. IMPRESSION: Mild cardiomegaly without acute abnormality of the lungs. Electronically Signed   By: Eddie Candle M.D.   On: 06/04/2019 23:30    EKG: Independently reviewed.   Assessment/Plan  Symptomatic anemia -Suspect secondary to ESRD, possibly some blood loss from right upper extremity fistula placement on 05/31/2019 -Stool guaiac was negative -Continue transfusion of 1 unit PRBC started in the emergency room -Serial hemoglobin following transfusion    Essential hypertension, benign -Stable.  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

## 2019-06-05 NOTE — Therapy (Signed)
[facility] [address] [place], [place], [address] Phone: [phone]   Fax:  [phone]  Patient Details  Name: [name]. MRN: [id number] Date of Birth: [birth year] Referring Provider:  No ref. provider found  Encounter Date: 06/05/2019  PHYSICAL THERAPY DISCHARGE SUMMARY  Visits from Start of Care: eval and 1 treatment  Current functional level related to goals / functional outcomes: Per chart review, pt recently hospitalized due to weakness so unsure of current functional status.    Remaining deficits: Per chart review, pt recently hospitalized due to weakness so unsure of current functional status.    Education / Equipment: HEP at 3/11 treatment  Plan: Patient agrees to discharge.  Patient goals were not met. Patient is being discharged due to not returning since the last visit.  ?????     Pt has not returned to therapy since 05/03/19 treatment session due to personal medical status and family medical status. Pt is being discharged from therapy at this time due to no show policy, not showing for 3 appointments consecutively and failing to call clinic. PTA left voicemail regarding no show policy and educating pt to call MD for further referral.   Talbot Grumbling PT, DPT 06/05/19, 1:53 PM [place], [place], [address] Phone: [phone]   Fax:  [phone]

## 2019-06-05 NOTE — ED Notes (Signed)
Dr. Jimmye Norman at bedside to talk to pt

## 2019-06-05 NOTE — Progress Notes (Signed)
Physician Discharge Summary  [name]. TKW:[id number] DOB: 09/24/1970 DOA: 06/04/2019  PCP: [name], MD  Admit date: 06/04/2019 Discharge date: 06/05/2019  Admitted From: home Disposition: home  Recommendations for Outpatient Follow-up:  1. Follow up with PCP in 1-2 weeks 2. F/u nephro in 1-2 weeks  Home Health: no Equipment/Devices:  Discharge Condition: stable CODE STATUS: full  Diet recommendation: Heart Healthy / Carb Modified / Renal  Brief/Interim Summary: HPI was taken from Dr. Damita Dunnings: [name]. is a 49 y.o. male with medical history significant for ESRD on HD MWF, hypertension, diabetes and chronic diastolic heart failure, who was sent from dialysis because of a low hemoglobin.  Patient states that he had been experiencing weakness shortness of breath and dizziness for the past week or so.  Patient underwent placement of an AV fistula right upper extremity on 05/31/2019.  Hemoglobin at that time was 9.5.  In dialysis hemoglobin was under 7.  Patient denies having black stool or seeing blood in the stool. Has occasional vomiting related to his gastroparesis but no blood and no coffee grounds.  He denied chest pain, palpitations ED Course: Vital signs in the ER were within normal limits.  Hemoglobin was 6.9, down from 9.5 on 05/31/2019.  Stool guaiac negative.  Potassium slightly low at 2.9 but lab work otherwise unremarkable for dialysis patient.  Chest x-ray showed mild cardiomegaly without acute findings.  EKG, normal sinus rhythm with nonspecific ST-T wave changes.  Patient was typed and crossed and started on 1 unit PRBC.  Hospitalist consulted for admission.   Hospital Course from Dr. 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 Pt did receive 1 unit of PRBCs for Hb of 6.9. [name] H&H was 8.2 & 25.8 on the day of d/c. Nephro saw the pt and pt did not require HD as pt has HD MWF. Pt was d/c home but left prior to receiving his  paperwork as he did not want to wait on it.   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 Stool guaiac was negative. S/p 1 unit of PRBCs. Continue to monitor H&H  ESRD: on HD. Management per nephro   HTN: continue lisinopril and carvedilol  DM2: with chronic kidney disease on HD. Continue on SSI w/ accuchecks   Chronic diastolic heart failure: not acutely exacerbated. Continue carvedilol and lisinopril  Discharge Instructions  Discharge Instructions    Diet - low sodium heart healthy   Complete by: As directed    Renal diet   Diet Carb Modified   Complete by: As directed    Discharge instructions   Complete by: As directed    F/u PCP in 1-2 weeks; F/u nephro in 1-2 weeks   Increase activity slowly   Complete by: As directed      Allergies as of 06/05/2019      Reactions   Levofloxacin Swelling   Of face   Phenergan [promethazine Hcl] Other (See Comments)   Cramping all over   Malt Other (See Comments)   Other reaction(s): cramping   Soy Allergy Other (See Comments)   Other reaction(s): swelling of face....grew out of it.  No precautions now  Medication List    TAKE these medications   B-D ULTRAFINE III SHORT PEN 31G X 8 MM Misc Generic drug: Insulin Pen Needle 1 each by Does not apply route as directed.   calcium citrate-vitamin D 315-200 MG-UNIT tablet Commonly known as: CITRACAL+D Take 2 tablets by mouth daily.   carvedilol 12.5 MG tablet Commonly known as: COREG Take 12.5 mg by mouth daily.   citalopram 20 MG tablet Commonly known as: CELEXA Take 20 mg by mouth daily.   fluticasone 50 MCG/ACT nasal spray Commonly known as:  FLONASE Place 1 spray into both nostrils daily.   HYDROcodone-acetaminophen 5-325 MG tablet Commonly known as: Norco Take 1 tablet by mouth every 6 (six) hours as needed for moderate pain.   linagliptin 5 MG Tabs tablet Commonly known as: Tradjenta Take 1 tablet (5 mg total) by mouth daily.   lisinopril 20 MG tablet Commonly known as: ZESTRIL Take 20 mg by mouth 2 (two) times daily.   metoCLOPramide 10 MG tablet Commonly known as: REGLAN Take 1 tablet (10 mg total) by mouth 2 (two) times daily at 8 am and 10 pm.   omeprazole 40 MG capsule Commonly known as: PRILOSEC Take 80 mg by mouth 2 (two) times a day.   ondansetron 4 MG disintegrating tablet Commonly known as: ZOFRAN-ODT Take 4 mg by mouth every 8 (eight) hours as needed for nausea or vomiting.   rosuvastatin 40 MG tablet Commonly known as: CRESTOR Take 40 mg by mouth daily.   tamsulosin 0.4 MG Caps capsule Commonly known as: FLOMAX Take 0.4 mg by mouth daily after breakfast.       Allergies  Allergen Reactions  . Levofloxacin Swelling    Of face  . Phenergan [Promethazine Hcl] Other (See Comments)    Cramping all over   . Malt Other (See Comments)    Other reaction(s): cramping  . Soy Allergy Other (See Comments)    Other reaction(s): swelling of face....grew out of it.  No precautions now     Consultations:  nephro   Procedures/Studies: DG Chest 2 View  Result Date: 06/04/2019 CLINICAL DATA:  Shortness of breath EXAM: CHEST - 2 VIEW COMPARISON:  09/13/2018 FINDINGS: Mild cardiomegaly. Right chest multi lumen vascular catheter. Both lungs are clear. The visualized skeletal structures are unremarkable. IMPRESSION: Mild cardiomegaly without acute abnormality of the lungs. Electronically Signed   By: Eddie Candle M.D.   On: 06/04/2019 23:30   VAS [place] UPPER EXTREMITY ARTERIAL DUPLEX  Result Date: 05/21/2019 UPPER EXTREMITY DUPLEX STUDY Indications: Patient complains of Pre -op AVF.  Performing  Technologist: Charlane Ferretti RT (R)(VS)  Examination Guidelines: A complete evaluation includes B-mode imaging, spectral Doppler, color Doppler, and power Doppler as needed of all accessible portions of each vessel. Bilateral testing is considered an integral part of a complete examination. Limited examinations for reoccurring indications may be performed as noted.  Right Pre-Dialysis Findings: +-----------------------+----------+--------------------+---------+--------+ Location               PSV (cm/s)Intralum. Diam. (cm)Waveform Comments +-----------------------+----------+--------------------+---------+--------+ Brachial Antecub. fossa121       0.48                triphasic         +-----------------------+----------+--------------------+---------+--------+ Radial Art at Wrist    108       0.24                triphasic         +-----------------------+----------+--------------------+---------+--------+ Ulnar Art at Wrist  116       0.24                triphasic         +-----------------------+----------+--------------------+---------+--------+ Left Pre-Dialysis Findings: +-----------------------+----------+--------------------+---------+--------+ Location               PSV (cm/s)Intralum. Diam. (cm)Waveform Comments +-----------------------+----------+--------------------+---------+--------+ Brachial Antecub. fossa104       0.44                triphasic         +-----------------------+----------+--------------------+---------+--------+ Radial Art at Wrist    104       0.24                triphasic         +-----------------------+----------+--------------------+---------+--------+ Ulnar Art at Wrist     109       0.26                triphasic         +-----------------------+----------+--------------------+---------+--------+ Summary:  Right: No obstruction visualized in the right upper extremity. Left: No obstruction visualized in the left upper extremity.  *See table(s) above for measurements and observations. Electronically signed by [name] MD on 05/21/2019 at 4:35:14 PM.    Final    VAS [place] UPPER EXTREMITY VEIN MAPPING  Result Date: 05/21/2019 UPPER EXTREMITY VEIN MAPPING  Indications: Pre-access. Performing Technologist: Charlane Ferretti RT (R)(VS)  Examination Guidelines: A complete evaluation includes B-mode imaging, spectral Doppler, color Doppler, and power Doppler as needed of all accessible portions of each vessel. Bilateral testing is considered an integral part of a complete examination. Limited examinations for reoccurring indications may be performed as noted. +-----------------+-------------+----------+---------+ Right Cephalic   Diameter (cm)Depth (cm)Findings  +-----------------+-------------+----------+---------+ Prox upper arm       0.40                         +-----------------+-------------+----------+---------+ Mid upper arm        0.37                         +-----------------+-------------+----------+---------+ Dist upper arm       0.33                         +-----------------+-------------+----------+---------+ Antecubital fossa    0.37               branching +-----------------+-------------+----------+---------+ Prox forearm         0.18                         +-----------------+-------------+----------+---------+ Mid forearm          0.13                         +-----------------+-------------+----------+---------+ Dist forearm         0.11                         +-----------------+-------------+----------+---------+ +-----------------+-------------+----------+---------+ Right Basilic    Diameter (cm)Depth (cm)Findings  +-----------------+-------------+----------+---------+ Prox upper arm       0.67                         +-----------------+-------------+----------+---------+ Mid upper arm        0.57  branching  +-----------------+-------------+----------+---------+ Dist upper arm       0.32                         +-----------------+-------------+----------+---------+ Antecubital fossa    0.24               branching +-----------------+-------------+----------+---------+ Prox forearm         0.15                         +-----------------+-------------+----------+---------+ +-----------------+-------------+----------+--------+ Left Cephalic    Diameter (cm)Depth (cm)Findings +-----------------+-------------+----------+--------+ Prox upper arm       0.39                        +-----------------+-------------+----------+--------+ Mid upper arm        0.34                        +-----------------+-------------+----------+--------+ Dist upper arm       0.24                        +-----------------+-------------+----------+--------+ Antecubital fossa    0.29                        +-----------------+-------------+----------+--------+ Prox forearm         0.39                        +-----------------+-------------+----------+--------+ Mid forearm          0.28                        +-----------------+-------------+----------+--------+ Dist forearm         0.20                        +-----------------+-------------+----------+--------+ +-----------------+-------------+----------+--------+ Left Basilic     Diameter (cm)Depth (cm)Findings +-----------------+-------------+----------+--------+ Prox upper arm       0.49                        +-----------------+-------------+----------+--------+ Mid upper arm        0.65                        +-----------------+-------------+----------+--------+ Dist upper arm       0.31                        +-----------------+-------------+----------+--------+ Antecubital fossa    0.16                        +-----------------+-------------+----------+--------+ Prox forearm         0.11                         +-----------------+-------------+----------+--------+ Summary: Right: Cephalic and basilic veins are compressable. Left: Ceplalic and basilic veins are compressable. *See table(s) above for measurements and observations.  Diagnosing physician: [name] MD Electronically signed by [name] MD on 05/21/2019 at 4:35:16 PM.    Final        Subjective: Pt denies any complaints   Discharge Exam: Vitals:   06/05/19 0930 06/05/19 1000  BP: (!) 171/90 (!) 149/80  Pulse: 83  80  Resp: (!) 21 11  Temp:    SpO2: 98% 100%   Vitals:   06/05/19 0830 06/05/19 0900 06/05/19 0930 06/05/19 1000  BP: (!) 192/93 (!) 176/85 (!) 171/90 (!) 149/80  Pulse: 85 84 83 80  Resp: 11 11 (!) 21 11  Temp:      TempSrc:      SpO2: 100% 99% 98% 100%  Weight:      Height:        General: Pt is alert, awake, not in acute distress Cardiovascular: S1/S2 +, no rubs, no gallops Respiratory: decreased breaths sounds b/l otherwise clear, no wheezing, no rhonchi Abdominal: Soft, NT, ND, bowel sounds + Extremities: no edema, no cyanosis    The results of significant diagnostics from this hospitalization (including imaging, microbiology, ancillary and laboratory) are listed below for reference.     Microbiology: Recent Results (from the past 240 hour(s))  SARS CORONAVIRUS 2 (TAT 6-24 HRS) Nasopharyngeal Nasopharyngeal Swab     Status: None   Collection Time: 05/29/19  9:13 AM   Specimen: Nasopharyngeal Swab  Result Value Ref Range Status   SARS Coronavirus 2 NEGATIVE NEGATIVE Final    Comment: (NOTE) SARS-CoV-2 target nucleic acids are NOT DETECTED. The SARS-CoV-2 RNA is generally detectable in upper and lower respiratory specimens during the acute phase of infection. Negative results do not preclude SARS-CoV-2 infection, do not rule out co-infections with other pathogens, and should not be used as the sole basis for treatment or other patient management decisions. Negative results must be combined with  clinical observations, patient history, and epidemiological information. The expected result is Negative. Fact Sheet for Patients: SugarRoll.be Fact Sheet for Healthcare Providers: https://www.woods-mathews.com/ This test is not yet approved or cleared by the [place] FDA and  has been authorized for detection and/or diagnosis of SARS-CoV-2 by FDA under an Emergency Use Authorization (EUA). This EUA will remain  in effect (meaning this test can be used) for the duration of the COVID-19 declaration under Section 56 4(b)(1) of the Act, 21 U.S.C. section 360bbb-3(b)(1), unless the authorization is terminated or revoked sooner. Performed at [facility], [place] [address], [place], [place] [address]   SARS CORONAVIRUS 2 (TAT 6-24 HRS) Nasopharyngeal Nasopharyngeal Swab     Status: None   Collection Time: 06/05/19 12:55 AM   Specimen: Nasopharyngeal Swab  Result Value Ref Range Status   SARS Coronavirus 2 NEGATIVE NEGATIVE Final    Comment: (NOTE) SARS-CoV-2 target nucleic acids are NOT DETECTED. The SARS-CoV-2 RNA is generally detectable in upper and lower respiratory specimens during the acute phase of infection. Negative results do not preclude SARS-CoV-2 infection, do not rule out co-infections with other pathogens, and should not be used as the sole basis for treatment or other patient management decisions. Negative results must be combined with clinical observations, patient history, and epidemiological information. The expected result is Negative. Fact Sheet for Patients: SugarRoll.be Fact Sheet for Healthcare Providers: https://www.woods-mathews.com/ This test is not yet approved or cleared by the [place] FDA and  has been authorized for detection and/or diagnosis of SARS-CoV-2 by FDA under an Emergency Use Authorization (EUA). This EUA will remain  in effect (meaning this test can  be used) for the duration of the COVID-19 declaration under Section 56 4(b)(1) of the Act, 21 U.S.C. section 360bbb-3(b)(1), unless the authorization is terminated or revoked sooner. Performed at [facility], [place] [address] [place], [place] [address]      Labs: BNP (last  3 results) Recent Labs    06/22/18 1423  BNP 64.3   Basic Metabolic Panel: Recent Labs  Lab 05/31/19 0701 06/04/19 2045 06/05/19 0819  NA 138 139 145  K 3.6 2.9* 3.6  CL 101 99 107  CO2  --  30 29  GLUCOSE 90 193* 90  BUN 28* 26* 35*  CREATININE 5.50* 5.24* 6.20*  CALCIUM  --  8.2* 8.5*   Liver Function Tests: No results for input(s): AST, ALT, ALKPHOS, BILITOT, PROT, ALBUMIN in the last 168 hours. No results for input(s): LIPASE, AMYLASE in the last 168 hours. No results for input(s): AMMONIA in the last 168 hours. CBC: Recent Labs  Lab 05/31/19 0701 06/04/19 2045 06/05/19 0420 06/05/19 0819  WBC  --  8.2  --  8.9  HGB 9.5* 6.9* 7.6* 8.2*  HCT 28.0* 22.1* 24.8* 25.8*  MCV  --  76.7*  --  78.2*  PLT  --  279  --  289   Cardiac Enzymes: No results for input(s): CKTOTAL, CKMB, CKMBINDEX, TROPONINI in the last 168 hours. BNP: Invalid input(s): POCBNP CBG: Recent Labs  Lab 05/31/19 0718 05/31/19 0942 05/31/19 1031 06/05/19 0157 06/05/19 0723  GLUCAP 88 76 110* 87 90   D-Dimer No results for input(s): DDIMER in the last 72 hours. Hgb A1c No results for input(s): HGBA1C in the last 72 hours. Lipid Profile No results for input(s): CHOL, HDL, LDLCALC, TRIG, CHOLHDL, LDLDIRECT in the last 72 hours. Thyroid function studies No results for input(s): TSH, T4TOTAL, T3FREE, THYROIDAB in the last 72 hours.  Invalid input(s): FREET3 Anemia work up No results for input(s): VITAMINB12, FOLATE, FERRITIN, TIBC, IRON, RETICCTPCT in the last 72 hours. Urinalysis    Component Value Date/Time   COLORURINE YELLOW (A) 11/07/2017 1855   APPEARANCEUR CLOUDY (A) 11/07/2017 1855   LABSPEC  1.016 11/07/2017 1855   PHURINE 5.0 11/07/2017 1855   GLUCOSEU >=500 (A) 11/07/2017 1855   HGBUR MODERATE (A) 11/07/2017 1855   BILIRUBINUR NEGATIVE 11/07/2017 1855   KETONESUR NEGATIVE 11/07/2017 1855   PROTEINUR >=300 (A) 11/07/2017 1855   NITRITE NEGATIVE 11/07/2017 1855   LEUKOCYTESUR NEGATIVE 11/07/2017 1855   Sepsis Labs Invalid input(s): PROCALCITONIN,  WBC,  LACTICIDVEN Microbiology Recent Results (from the past 240 hour(s))  SARS CORONAVIRUS 2 (TAT 6-24 HRS) Nasopharyngeal Nasopharyngeal Swab     Status: None   Collection Time: 05/29/19  9:13 AM   Specimen: Nasopharyngeal Swab  Result Value Ref Range Status   SARS Coronavirus 2 NEGATIVE NEGATIVE Final    Comment: (NOTE) SARS-CoV-2 target nucleic acids are NOT DETECTED. The SARS-CoV-2 RNA is generally detectable in upper and lower respiratory specimens during the acute phase of infection. Negative results do not preclude SARS-CoV-2 infection, do not rule out co-infections with other pathogens, and should not be used as the sole basis for treatment or other patient management decisions. Negative results must be combined with clinical observations, patient history, and epidemiological information. The expected result is Negative. Fact Sheet for Patients: SugarRoll.be Fact Sheet for Healthcare Providers: https://www.woods-mathews.com/ This test is not yet approved or cleared by the [place] FDA and  has been authorized for detection and/or diagnosis of SARS-CoV-2 by FDA under an Emergency Use Authorization (EUA). This EUA will remain  in effect (meaning this test can be used) for the duration of the COVID-19 declaration under Section 56 4(b)(1) of the Act, 21 U.S.C. section 360bbb-3(b)(1), unless the authorization is terminated or revoked sooner. Performed at [facility], [place]  [address] [place], [place] [address]   SARS CORONAVIRUS 2 (TAT 6-24 HRS) Nasopharyngeal  Nasopharyngeal Swab     Status: None   Collection Time: 06/05/19 12:55 AM   Specimen: Nasopharyngeal Swab  Result Value Ref Range Status   SARS Coronavirus 2 NEGATIVE NEGATIVE Final    Comment: (NOTE) SARS-CoV-2 target nucleic acids are NOT DETECTED. The SARS-CoV-2 RNA is generally detectable in upper and lower respiratory specimens during the acute phase of infection. Negative results do not preclude SARS-CoV-2 infection, do not rule out co-infections with other pathogens, and should not be used as the sole basis for treatment or other patient management decisions. Negative results must be combined with clinical observations, patient history, and epidemiological information. The expected result is Negative. Fact Sheet for Patients: SugarRoll.be Fact Sheet for Healthcare Providers: https://www.woods-mathews.com/ This test is not yet approved or cleared by the [place] FDA and  has been authorized for detection and/or diagnosis of SARS-CoV-2 by FDA under an Emergency Use Authorization (EUA). This EUA will remain  in effect (meaning this test can be used) for the duration of the COVID-19 declaration under Section 56 4(b)(1) of the Act, 21 U.S.C. section 360bbb-3(b)(1), unless the authorization is terminated or revoked sooner. Performed at [facility], [place] [address] [place], [place] [address]      Time coordinating discharge: Over 30 minutes  SIGNED:   Wyvonnia Dusky, MD  [facility] 06/05/2019, 12:01 PM Pager   If 7PM-7AM, please contact night-coverage [email or web address]

## 2019-06-05 NOTE — ED Notes (Signed)
Dr. Duncan to bedside 

## 2019-06-05 NOTE — Progress Notes (Signed)
[place], [place] 06/05/19  Subjective:   Hospital day # 0 Patient known to our practice from outpatient dialysis He dialyzes at [facility] on MWF schedule States he received his dialysis yesterday He presented to the ER for weakness,And was found to have symptomatic anemia.  He was transfused 1 unit of red blood cells over 4 hours.  Patient denies blood in the urine.  Occult blood in the stool is negative. This morning he feels he is back to baseline.  He can walk without the assistance of walker.  Able to eat without nausea or vomiting Wanting to go home   Objective:  Vital signs in last 24 hours:  Temp:  [98 F (36.7 C)-98.7 F (37.1 C)] 98 F (36.7 C) (04/13 0412) Pulse Rate:  [78-90] 80 (04/13 1000) Resp:  [7-21] 11 (04/13 1000) BP: (118-192)/(58-93) 149/80 (04/13 1000) SpO2:  [95 %-100 %] 100 % (04/13 1000) Weight:  [95.3 kg] 95.3 kg (04/12 2023)  Weight change:  Filed Weights   06/04/19 2023  Weight: 95.3 kg    Intake/Output:    Intake/Output Summary (Last 24 hours) at 06/05/2019 1132 Last data filed at 06/05/2019 0412 Gross per 24 hour  Intake 1150.42 ml  Output --  Net 1150.42 ml     Physical Exam: General:  No acute distress, sitting up in the bed  HEENT  anicteric, moist oral mucous membrane  Pulm/lungs  normal breathing effort, clear to auscultation  CVS/Heart  no rub or gallop  Abdomen:   Soft, nontender  Extremities:  No peripheral edema  Neurologic:  Alert, oriented  Skin:  No acute rashes  Access:  PermCath, right arm developing access       Basic Metabolic Panel:  Recent Labs  Lab 05/31/19 0701 06/04/19 2045 06/05/19 0819  NA 138 139 145  K 3.6 2.9* 3.6  CL 101 99 107  CO2  --  30 29  GLUCOSE 90 193* 90  BUN 28* 26* 35*  CREATININE 5.50* 5.24* 6.20*  CALCIUM  --  8.2* 8.5*     CBC: Recent Labs  Lab 05/31/19 0701 06/04/19 2045 06/05/19 0420 06/05/19 0819  WBC  --  8.2  --  8.9  HGB 9.5*  6.9* 7.6* 8.2*  HCT 28.0* 22.1* 24.8* 25.8*  MCV  --  76.7*  --  78.2*  PLT  --  279  --  289     No results found for: HEPBSAG, HEPBSAB, HEPBIGM    Microbiology:  Recent Results (from the past 240 hour(s))  SARS CORONAVIRUS 2 (TAT 6-24 HRS) Nasopharyngeal Nasopharyngeal Swab     Status: None   Collection Time: 05/29/19  9:13 AM   Specimen: Nasopharyngeal Swab  Result Value Ref Range Status   SARS Coronavirus 2 NEGATIVE NEGATIVE Final    Comment: (NOTE) SARS-CoV-2 target nucleic acids are NOT DETECTED. The SARS-CoV-2 RNA is generally detectable in upper and lower respiratory specimens during the acute phase of infection. Negative results do not preclude SARS-CoV-2 infection, do not rule out co-infections with other pathogens, and should not be used as the sole basis for treatment or other patient management decisions. Negative results must be combined with clinical observations, patient history, and epidemiological information. The expected result is Negative. Fact Sheet for Patients: SugarRoll.be Fact Sheet for Healthcare Providers: https://www.woods-mathews.com/ This test is not yet approved or cleared by the [place] FDA and  has been authorized for detection and/or diagnosis of SARS-CoV-2 by FDA under an Emergency Use  Authorization (EUA). This EUA will remain  in effect (meaning this test can be used) for the duration of the COVID-19 declaration under Section 56 4(b)(1) of the Act, 21 U.S.C. section 360bbb-3(b)(1), unless the authorization is terminated or revoked sooner. Performed at [facility], [place] [facility] [address] [place], [place] [address]   SARS CORONAVIRUS 2 (TAT 6-24 HRS) Nasopharyngeal Nasopharyngeal Swab     Status: None   Collection Time: 06/05/19 12:55 AM   Specimen: Nasopharyngeal Swab  Result Value Ref Range Status   SARS Coronavirus 2 NEGATIVE NEGATIVE Final    Comment: (NOTE) SARS-CoV-2 target nucleic  acids are NOT DETECTED. The SARS-CoV-2 RNA is generally detectable in upper and lower respiratory specimens during the acute phase of infection. Negative results do not preclude SARS-CoV-2 infection, do not rule out co-infections with other pathogens, and should not be used as the sole basis for treatment or other patient management decisions. Negative results must be combined with clinical observations, patient history, and epidemiological information. The expected result is Negative. Fact Sheet for Patients: SugarRoll.be Fact Sheet for Healthcare Providers: https://www.woods-mathews.com/ This test is not yet approved or cleared by the [place] FDA and  has been authorized for detection and/or diagnosis of SARS-CoV-2 by FDA under an Emergency Use Authorization (EUA). This EUA will remain  in effect (meaning this test can be used) for the duration of the COVID-19 declaration under Section 56 4(b)(1) of the Act, 21 U.S.C. section 360bbb-3(b)(1), unless the authorization is terminated or revoked sooner. Performed at [facility], [place] [address], [place], [place] [address]     Coagulation Studies: No results for input(s): LABPROT, INR in the last 72 hours.  Urinalysis: No results for input(s): COLORURINE, LABSPEC, PHURINE, GLUCOSEU, HGBUR, BILIRUBINUR, KETONESUR, PROTEINUR, UROBILINOGEN, NITRITE, LEUKOCYTESUR in the last 72 hours.  Invalid input(s): APPERANCEUR    Imaging: DG Chest 2 View  Result Date: 06/04/2019 CLINICAL DATA:  Shortness of breath EXAM: CHEST - 2 VIEW COMPARISON:  09/13/2018 FINDINGS: Mild cardiomegaly. Right chest multi lumen vascular catheter. Both lungs are clear. The visualized skeletal structures are unremarkable. IMPRESSION: Mild cardiomegaly without acute abnormality of the lungs. Electronically Signed   By: Eddie Candle M.D.   On: 06/04/2019 23:30     Medications:    . carvedilol  12.5 mg Oral Daily   . citalopram  20 mg Oral Daily  . insulin aspart  0-5 Units Subcutaneous QHS  . insulin aspart  0-6 Units Subcutaneous TID WC  . lisinopril  20 mg Oral BID  . pantoprazole  40 mg Oral Daily  . rosuvastatin  40 mg Oral q1800  . tamsulosin  0.4 mg Oral QPC breakfast   acetaminophen **OR** acetaminophen, HYDROcodone-acetaminophen, ondansetron **OR** ondansetron (ZOFRAN) IV, senna-docusate  Assessment/ Plan:  49 y.o. male with end-stage renal disease, hypertension, diabetes, diastolic CHF  admitted on 06/04/2019 for Symptomatic anemia [D64.9]  #Anemia of chronic kidney disease Patient received 1 unit of PRBC and has symptomatically improved Has more energy levels. No obvious source of bleeding has been located so far. His ESA therapy as outpatient will be adjusted.  Primary nephrologist notified  #End-stage renal disease No acute indication for dialysis at present     LOS: 0 Elida Harbin Candiss Norse [id number]/[id number]:32 [place], [place]  Note: This note was prepared with Dragon dictation. Any transcription errors are unintentional

## 2019-06-06 LAB — TYPE AND SCREEN
ABO/RH(D): A POS
Antibody Screen: NEGATIVE
Unit division: 0

## 2019-06-06 LAB — BPAM RBC
Blood Product Expiration Date: 202105082359
ISSUE DATE / TIME: 202104130108
Unit Type and Rh: 6200

## 2019-06-07 ENCOUNTER — Ambulatory Visit (HOSPITAL_COMMUNITY): Payer: Medicare (Managed Care)

## 2019-06-12 ENCOUNTER — Encounter (HOSPITAL_COMMUNITY): Payer: Medicare (Managed Care)

## 2019-06-14 ENCOUNTER — Encounter (HOSPITAL_COMMUNITY): Payer: Medicare (Managed Care)

## 2019-06-19 ENCOUNTER — Encounter (HOSPITAL_COMMUNITY): Payer: Medicare (Managed Care)

## 2019-06-21 ENCOUNTER — Encounter (HOSPITAL_COMMUNITY): Payer: Medicare (Managed Care)

## 2019-07-04 ENCOUNTER — Other Ambulatory Visit (INDEPENDENT_AMBULATORY_CARE_PROVIDER_SITE_OTHER): Payer: Self-pay | Admitting: Vascular Surgery

## 2019-07-04 DIAGNOSIS — Z9889 Other specified postprocedural states: Secondary | ICD-10-CM

## 2019-07-04 DIAGNOSIS — N186 End stage renal disease: Secondary | ICD-10-CM

## 2019-07-05 ENCOUNTER — Ambulatory Visit (INDEPENDENT_AMBULATORY_CARE_PROVIDER_SITE_OTHER): Payer: Medicare (Managed Care) | Admitting: Nurse Practitioner

## 2019-07-05 ENCOUNTER — Encounter (INDEPENDENT_AMBULATORY_CARE_PROVIDER_SITE_OTHER): Payer: Medicare (Managed Care)

## 2019-07-05 DIAGNOSIS — H4052X4 Glaucoma secondary to other eye disorders, left eye, indeterminate stage: Secondary | ICD-10-CM | POA: Insufficient documentation

## 2019-07-05 HISTORY — DX: Glaucoma secondary to other eye disorders, left eye, indeterminate stage: H40.52X4

## 2019-07-17 ENCOUNTER — Other Ambulatory Visit: Payer: Self-pay

## 2019-07-17 ENCOUNTER — Encounter (INDEPENDENT_AMBULATORY_CARE_PROVIDER_SITE_OTHER): Payer: Self-pay | Admitting: Nurse Practitioner

## 2019-07-17 ENCOUNTER — Ambulatory Visit (INDEPENDENT_AMBULATORY_CARE_PROVIDER_SITE_OTHER): Payer: Medicare (Managed Care) | Admitting: Nurse Practitioner

## 2019-07-17 ENCOUNTER — Ambulatory Visit (INDEPENDENT_AMBULATORY_CARE_PROVIDER_SITE_OTHER): Payer: Medicare (Managed Care)

## 2019-07-17 VITALS — BP 158/88 | HR 76 | Ht 75.0 in | Wt 197.0 lb

## 2019-07-17 DIAGNOSIS — Z992 Dependence on renal dialysis: Secondary | ICD-10-CM

## 2019-07-17 DIAGNOSIS — I10 Essential (primary) hypertension: Secondary | ICD-10-CM

## 2019-07-17 DIAGNOSIS — N186 End stage renal disease: Secondary | ICD-10-CM

## 2019-07-17 DIAGNOSIS — Z9889 Other specified postprocedural states: Secondary | ICD-10-CM

## 2019-07-17 DIAGNOSIS — E782 Mixed hyperlipidemia: Secondary | ICD-10-CM

## 2019-07-17 DIAGNOSIS — Z794 Long term (current) use of insulin: Secondary | ICD-10-CM

## 2019-07-17 DIAGNOSIS — E1165 Type 2 diabetes mellitus with hyperglycemia: Secondary | ICD-10-CM

## 2019-07-18 ENCOUNTER — Encounter (INDEPENDENT_AMBULATORY_CARE_PROVIDER_SITE_OTHER): Payer: Self-pay | Admitting: Nurse Practitioner

## 2019-07-18 NOTE — Progress Notes (Signed)
Subjective:    Patient ID: [name], male    DOB: 07/02/1970, 49 y.o.   MRN: [id number] Chief Complaint  Patient presents with  . Follow-up    5 wk [name] post fistula creation     The patient returns to the office for followup status post right brachiocephalic AV fistula.  The patient endorses having some numbness of his hand following the procedure.  He denies any coldness or severe pain.  The patient notes a swelling near the distal portion of the fistula.  He still is currently maintained via PermCath.  He states that PermCath is working well however it is very itchy for him.  The fistula is prominent and easily palpable.  The patient denies amaurosis fugax or recent TIA symptoms. There are no recent neurological changes noted. The patient denies claudication symptoms or rest pain symptoms. The patient denies history of DVT, PE or superficial thrombophlebitis. The patient denies recent episodes of angina or shortness of breath.   Today noninvasive studies show a flow volume of 2478.  There is a hematoma measuring 3.91 cm x 1.89 cm x 4.2 cm with no vasculature which is located anterior to the anastomosis site.   Review of Systems  Cardiovascular:       Itching near permcath  All other systems reviewed and are negative.           Objective:   Physical Exam Vitals reviewed.  Cardiovascular:     Rate and Rhythm: Normal rate and regular rhythm.     Pulses: Normal pulses.     Heart sounds: Normal heart sounds.     Arteriovenous access: right arteriovenous access is present.    Comments: Right brachial cephalic with good thrill and bruit.  Prominent fistula.  Hematoma near distal portion Pulmonary:     Effort: Pulmonary effort is normal.     Breath sounds: Normal breath sounds.  Skin:    General: Skin is warm and dry.  Neurological:     Mental Status: He is alert and oriented to person, place, and time.  Psychiatric:        Mood and Affect: Mood normal.      Behavior: Behavior normal.        Thought Content: Thought content normal.        Judgment: Judgment normal.     BP (!) 158/88   Pulse 76   Ht 6\' 3"  (1.905 m)   Wt 197 lb (89.4 kg)   BMI 24.62 kg/m   Past Medical History:  Diagnosis Date  . Anxiety   . Cellulitis   . CHF (congestive heart failure) ([place])   . Chronic diarrhea   . Chronic kidney disease   . Chronic pain of both ankles   . Diabetes mellitus without complication ([place])   . Diabetes mellitus, type II ([place])   . Diabetic neuropathy ([place])   . Frequent falls   . Gait instability   . GERD (gastroesophageal reflux disease)   . Heart palpitations   . History of kidney stones   . HLD (hyperlipidemia)   . Hypertension   . Insomnia   . Nausea and vomiting in adult    recurrent    Social History   Socioeconomic History  . Marital status: Married    Spouse name: [name]   . Number of children: 2  . Years of education: Not on file  . Highest education level: High school graduate  Occupational History  . Occupation: Disability  Comment: not employed  Tobacco Use  . Smoking status: Never Smoker  . Smokeless tobacco: Never Used  Substance and Sexual Activity  . Alcohol use: No  . Drug use: No  . Sexual activity: Yes  Other Topics Concern  . Not on file  Social History Narrative   Oldest son killed in car crash June 2020.    Social Determinants of Health   Financial Resource Strain:   . Difficulty of Paying Living Expenses:   Food Insecurity:   . Worried About Charity fundraiser in the Last Year:   . Arboriculturist in the Last Year:   Transportation Needs:   . Film/video editor (Medical):   Marland Kitchen Lack of Transportation (Non-Medical):   Physical Activity:   . Days of Exercise per Week:   . Minutes of Exercise per Session:   Stress:   . Feeling of Stress :   Social Connections:   . Frequency of Communication with Friends and Family:   . Frequency of Social Gatherings with Friends and Family:     . Attends Religious Services:   . Active Member of Clubs or Organizations:   . Attends Archivist Meetings:   Marland Kitchen Marital Status:   Intimate Partner Violence:   . Fear of Current or Ex-Partner:   . Emotionally Abused:   Marland Kitchen Physically Abused:   . Sexually Abused:     Past Surgical History:  Procedure Laterality Date  . AMPUTATION Left 03/16/2016   Procedure: AMPUTATION DIGIT LEFT HALLUX;  Surgeon: Trula Slade, DPM;  Location: [place];  Service: Podiatry;  Laterality: Left;  can start around 5   . ARTHROSCOPIC REPAIR ACL Left   . AV FISTULA PLACEMENT Right 05/31/2019   Procedure: Brachiocephalic AV fistula creation;  Surgeon: Algernon Huxley, MD;  Location: [name];  Service: Vascular;  Laterality: Right;  . COLONOSCOPY WITH PROPOFOL N/A 10/28/2015   Procedure: COLONOSCOPY WITH PROPOFOL;  Surgeon: Lollie Sails, MD;  Location: [facility] ENDOSCOPY;  Service: Endoscopy;  Laterality: N/A;  . COLONOSCOPY WITH PROPOFOL N/A 10/29/2015   Procedure: COLONOSCOPY WITH PROPOFOL;  Surgeon: Lollie Sails, MD;  Location: [facility] ENDOSCOPY;  Service: Endoscopy;  Laterality: N/A;  . DIALYSIS/PERMA CATHETER INSERTION Right 04/26/2019   [facility]   . DIALYSIS/PERMA CATHETER INSERTION N/A 04/26/2019   Procedure: DIALYSIS/PERMA CATHETER INSERTION;  Surgeon: Algernon Huxley, MD;  Location: [place] [facility];  Service: Cardiovascular;  Laterality: N/A;  . ESOPHAGOGASTRODUODENOSCOPY (EGD) WITH PROPOFOL N/A 12/27/2017   Procedure: ESOPHAGOGASTRODUODENOSCOPY (EGD) WITH PROPOFOL;  Surgeon: Toledo, Benay Pike, MD;  Location: [name] ENDOSCOPY;  Service: Gastroenterology;  Laterality: N/A;  . PROSTATE SURGERY  2016  . TONSILECTOMY/ADENOIDECTOMY WITH MYRINGOTOMY    . TONSILLECTOMY      Family History  Problem Relation Age of Onset  . CAD Father   . Stroke Father   . Diabetes Mellitus II Mother   . Kidney failure Mother   . Schizophrenia Mother     Allergies  Allergen Reactions  . Levofloxacin Swelling     Of face  . Phenergan [Promethazine Hcl] Other (See Comments)    Cramping all over   . Promethazine Diarrhea and Other (See Comments)    Other reaction(s): Other (See Comments), Other (See Comments) Other reaction(s): Muscle cramps Muscle cramps Cramping Other reaction(s): Muscle cramps Muscle cramps  . Malt Other (See Comments)    Other reaction(s): cramping  . Soy Allergy Other (See Comments)    Other reaction(s): swelling  of face....grew out of it.  No precautions now        Assessment & Plan:   1. ESRD on dialysis [facility]) Recommend:  The patient is doing well and currently has adequate dialysis access. The patient's dialysis center is not reporting any access issues. The patient's noninvasive studies revealed that the patient does have a adequate mature fistula.  The patient does continue to have some numbness in his hand.  This is somewhat concerning for possible steal symptoms.  Patient still has a palpable radial pulse.  Patient is advised that it can begin to get cold, painful or the numbness worsens following starting dialysis he should let our office know as an angiogram may be necessary.  Otherwise, the patient should have a duplex ultrasound of the dialysis access in 6 months. The patient will follow-up with me in the office after each ultrasound     2. Essential hypertension, benign Continue antihypertensive medications as already ordered, these medications have been reviewed and there are no changes at this time.   3. Uncontrolled type 2 diabetes mellitus with hyperglycemia, with long-term current use of insulin (HCC) Continue NSAID medications as already ordered, these medications have been reviewed and there are no changes at this time.  Continued activity and therapy was stressed.   4. Mixed hyperlipidemia Continue statin as ordered and reviewed, no changes at this time    Current Outpatient Medications on File Prior to Visit  Medication Sig Dispense  Refill  . calcium citrate-vitamin D (CITRACAL+D) 315-200 MG-UNIT tablet Take 2 tablets by mouth daily.     . carvedilol (COREG) 12.5 MG tablet Take 12.5 mg by mouth daily.   5  . citalopram (CELEXA) 20 MG tablet Take 20 mg by mouth daily.     . fluticasone (FLONASE) 50 MCG/ACT nasal spray Place 1 spray into both nostrils daily.    Marland Kitchen HYDROcodone-acetaminophen (NORCO) 5-325 MG tablet Take 1 tablet by mouth every 6 (six) hours as needed for moderate pain. 30 tablet 0  . Insulin Pen Needle (B-D ULTRAFINE III SHORT PEN) 31G X 8 MM MISC 1 each by Does not apply route as directed. 100 each 3  . linagliptin (TRADJENTA) 5 MG TABS tablet Take 1 tablet (5 mg total) by mouth daily. 30 tablet 3  . lisinopril (PRINIVIL,ZESTRIL) 20 MG tablet Take 20 mg by mouth 2 (two) times daily.   5  . metoCLOPramide (REGLAN) 10 MG tablet Take 1 tablet (10 mg total) by mouth 2 (two) times daily at 8 am and 10 pm. 60 tablet 3  . omeprazole (PRILOSEC) 40 MG capsule Take 80 mg by mouth 2 (two) times a day.     . ondansetron (ZOFRAN-ODT) 4 MG disintegrating tablet Take 4 mg by mouth every 8 (eight) hours as needed for nausea or vomiting.   1  . rosuvastatin (CRESTOR) 40 MG tablet Take 40 mg by mouth daily.     . tamsulosin (FLOMAX) 0.4 MG CAPS capsule Take 0.4 mg by mouth daily after breakfast.      No current facility-administered medications on file prior to visit.    There are no Patient Instructions on file for this visit. No follow-ups on file.   Kris Hartmann, NP

## 2019-07-20 DIAGNOSIS — I151 Hypertension secondary to other renal disorders: Secondary | ICD-10-CM | POA: Insufficient documentation

## 2019-07-20 DIAGNOSIS — N2889 Other specified disorders of kidney and ureter: Secondary | ICD-10-CM | POA: Insufficient documentation

## 2019-07-25 ENCOUNTER — Telehealth (INDEPENDENT_AMBULATORY_CARE_PROVIDER_SITE_OTHER): Payer: Self-pay | Admitting: Nurse Practitioner

## 2019-07-25 NOTE — Telephone Encounter (Signed)
Needs documentation faxed to her stating whether the patient is able to cannulate? Fax number [phone]

## 2019-07-26 NOTE — Telephone Encounter (Signed)
How do I go about getting this document to fax?

## 2019-07-26 NOTE — Telephone Encounter (Signed)
April faxed on 05/28, she's gonna try to refax the info

## 2019-07-26 NOTE — Telephone Encounter (Signed)
Ok thanks 

## 2019-08-03 ENCOUNTER — Telehealth (INDEPENDENT_AMBULATORY_CARE_PROVIDER_SITE_OTHER): Payer: Self-pay

## 2019-08-03 NOTE — Telephone Encounter (Signed)
Patient was made aware with medical advice and verbalized understanding 

## 2019-08-03 NOTE — Telephone Encounter (Signed)
The dialysis center will need to fax a referral to note that his fistula is working well and that it can be removed.  When the dialysis center lets [place] know, we will call him to schedule its removal

## 2019-08-10 ENCOUNTER — Telehealth (INDEPENDENT_AMBULATORY_CARE_PROVIDER_SITE_OTHER): Payer: Self-pay

## 2019-08-10 NOTE — Telephone Encounter (Signed)
A fax was received from Butch Penny at [facility] for the patient to have a permcath removal, patient is now scheduled with Dr. Delana Meyer for a permcath removal on 08/14/19 with a 12:00 pm arrival time to the [facility]. Patient will do covid testing on 08/13/19 before 11:00 am at the [facility]. Pre-procedure instructions will be faxed to [facility].

## 2019-08-13 ENCOUNTER — Other Ambulatory Visit: Admission: RE | Admit: 2019-08-13 | Payer: Medicare (Managed Care) | Source: Ambulatory Visit

## 2019-08-13 ENCOUNTER — Other Ambulatory Visit (INDEPENDENT_AMBULATORY_CARE_PROVIDER_SITE_OTHER): Payer: Self-pay | Admitting: Nurse Practitioner

## 2019-08-14 ENCOUNTER — Encounter
Admission: RE | Disposition: A | Discharge status: Home / Self Care | Payer: Self-pay | Source: Home / Self Care | Attending: Vascular Surgery

## 2019-08-14 ENCOUNTER — Telehealth (INDEPENDENT_AMBULATORY_CARE_PROVIDER_SITE_OTHER): Payer: Self-pay

## 2019-08-14 ENCOUNTER — Ambulatory Visit
Admission: RE | Admit: 2019-08-14 | Discharge: 2019-08-14 | Disposition: A | Discharge status: Home / Self Care | Payer: Medicare (Managed Care) | Attending: Vascular Surgery | Admitting: Vascular Surgery

## 2019-08-14 ENCOUNTER — Ambulatory Visit
Admission: RE | Admit: 2019-08-14 | Discharge: 2019-08-14 | Disposition: A | Discharge status: Home / Self Care | Payer: Medicare (Managed Care) | Source: Home / Self Care | Attending: Vascular Surgery | Admitting: Vascular Surgery

## 2019-08-14 DIAGNOSIS — N186 End stage renal disease: Secondary | ICD-10-CM

## 2019-08-14 DIAGNOSIS — Z20822 Contact with and (suspected) exposure to covid-19: Secondary | ICD-10-CM | POA: Insufficient documentation

## 2019-08-14 LAB — SARS CORONAVIRUS 2 (TAT 6-24 HRS): SARS Coronavirus 2: NEGATIVE

## 2019-08-14 SURGERY — DIALYSIS/PERMA CATHETER REMOVAL
Anesthesia: LOCAL

## 2019-08-14 NOTE — Progress Notes (Addendum)
Pt arrived at [name] today for [facility] removal. Per Patient he was called via phone regarding the date and time of procedure but was not told about his scheduled appointment for his Covid test. Dr. Delana Meyer updated. Pt taken to [facility] for Covid test. Unable to reach office staff at [facility] to reschedule. Pt agrees to call the office this afternoon to reschedule.

## 2019-08-14 NOTE — Telephone Encounter (Signed)
Patient was scheduled for a permcath removal today but did not do his covid test but showed up to the [facility] for her his procedure. Patient has been rescheduled for 08/15/19 with a 1:00 pm arrival time to the [facility]>

## 2019-08-15 ENCOUNTER — Ambulatory Visit
Admission: RE | Admit: 2019-08-15 | Payer: Medicare (Managed Care) | Source: Ambulatory Visit | Admitting: Vascular Surgery

## 2019-08-15 ENCOUNTER — Encounter
Admission: RE | Disposition: A | Discharge status: Home / Self Care | Payer: Self-pay | Source: Ambulatory Visit | Attending: Vascular Surgery

## 2019-08-15 ENCOUNTER — Ambulatory Visit
Admission: RE | Admit: 2019-08-15 | Payer: Medicare (Managed Care) | Source: Home / Self Care | Admitting: Vascular Surgery

## 2019-08-15 ENCOUNTER — Encounter: Payer: Self-pay | Admitting: Vascular Surgery

## 2019-08-15 ENCOUNTER — Ambulatory Visit
Admission: RE | Admit: 2019-08-15 | Discharge: 2019-08-15 | Disposition: A | Discharge status: Home / Self Care | Payer: Medicare (Managed Care) | Source: Ambulatory Visit | Attending: Vascular Surgery | Admitting: Vascular Surgery

## 2019-08-15 DIAGNOSIS — I132 Hypertensive heart and chronic kidney disease with heart failure and with stage 5 chronic kidney disease, or end stage renal disease: Secondary | ICD-10-CM | POA: Insufficient documentation

## 2019-08-15 DIAGNOSIS — Z833 Family history of diabetes mellitus: Secondary | ICD-10-CM | POA: Insufficient documentation

## 2019-08-15 DIAGNOSIS — Z89422 Acquired absence of other left toe(s): Secondary | ICD-10-CM | POA: Insufficient documentation

## 2019-08-15 DIAGNOSIS — G4700 Insomnia, unspecified: Secondary | ICD-10-CM | POA: Diagnosis not present

## 2019-08-15 DIAGNOSIS — Z881 Allergy status to other antibiotic agents status: Secondary | ICD-10-CM | POA: Diagnosis not present

## 2019-08-15 DIAGNOSIS — E8770 Fluid overload, unspecified: Secondary | ICD-10-CM | POA: Insufficient documentation

## 2019-08-15 DIAGNOSIS — E785 Hyperlipidemia, unspecified: Secondary | ICD-10-CM | POA: Diagnosis not present

## 2019-08-15 DIAGNOSIS — I509 Heart failure, unspecified: Secondary | ICD-10-CM | POA: Diagnosis not present

## 2019-08-15 DIAGNOSIS — Z888 Allergy status to other drugs, medicaments and biological substances status: Secondary | ICD-10-CM | POA: Insufficient documentation

## 2019-08-15 DIAGNOSIS — E11649 Type 2 diabetes mellitus with hypoglycemia without coma: Secondary | ICD-10-CM | POA: Insufficient documentation

## 2019-08-15 DIAGNOSIS — Z841 Family history of disorders of kidney and ureter: Secondary | ICD-10-CM | POA: Insufficient documentation

## 2019-08-15 DIAGNOSIS — E1140 Type 2 diabetes mellitus with diabetic neuropathy, unspecified: Secondary | ICD-10-CM | POA: Diagnosis not present

## 2019-08-15 DIAGNOSIS — E1122 Type 2 diabetes mellitus with diabetic chronic kidney disease: Secondary | ICD-10-CM | POA: Insufficient documentation

## 2019-08-15 DIAGNOSIS — Z4901 Encounter for fitting and adjustment of extracorporeal dialysis catheter: Secondary | ICD-10-CM | POA: Diagnosis not present

## 2019-08-15 DIAGNOSIS — Z992 Dependence on renal dialysis: Secondary | ICD-10-CM

## 2019-08-15 DIAGNOSIS — Z8249 Family history of ischemic heart disease and other diseases of the circulatory system: Secondary | ICD-10-CM | POA: Diagnosis not present

## 2019-08-15 DIAGNOSIS — N186 End stage renal disease: Secondary | ICD-10-CM

## 2019-08-15 HISTORY — PX: DIALYSIS/PERMA CATHETER REMOVAL: CATH118289

## 2019-08-15 SURGERY — DIALYSIS/PERMA CATHETER REMOVAL
Anesthesia: LOCAL

## 2019-08-15 SURGICAL SUPPLY — 2 items
FORCEPS HALSTEAD CVD 5IN STRL (INSTRUMENTS) ×2 IMPLANT
TRAY LACERAT/PLASTIC (MISCELLANEOUS) ×2 IMPLANT

## 2019-08-15 NOTE — Discharge Instructions (Signed)
Tunneled Catheter Removal, Care After Refer to this sheet in the next few weeks. These instructions provide you with information about caring for yourself after your procedure. Your health care provider may also give you more specific instructions. Your treatment has been planned according to current medical practices, but problems sometimes occur. Call your health care provider if you have any problems or questions after your procedure. What can I expect after the procedure? After the procedure, it is common to have: Some mild redness, swelling, and pain around your catheter site.   Follow these instructions at home: Incision care  Check your removal site  every day for signs of infection. Check for: More redness, swelling, or pain. More fluid or blood. Warmth. Pus or a bad smell. Remove your dressing in 48hrs leave open to air  Activity  Return to your normal activities as told by your health care provider. Ask your health care provider what activities are safe for you. Do not lift anything that is heavier than 10 lb (4.5 kg) for 3 days  You may shower tomorrow  Contact a health care provider if: You have more fluid or blood coming from your removal site You have more redness, swelling, or pain at your incisions or around the area where your catheter was removed Your removal site feel warm to the touch. You feel unusually weak. You feel nauseous.. Get help right away if You have swelling in your arm, shoulder, neck, or face. You develop chest pain. You have difficulty breathing. You feel dizzy or light-headed. You have pus or a bad smell coming from your removal site You have a fever. You develop bleeding from your removal site, and your bleeding does not stop. This information is not intended to replace advice given to you by your health care provider. Make sure you discuss any questions you have with your health care provider. Document Released: 01/26/2012 Document Revised:  10/12/2015 Document Reviewed: 11/04/2014 Elsevier Interactive Patient Education  2017 [employer] 

## 2019-08-15 NOTE — H&P (Signed)
[facility] Admission History & Physical  MRN : [id number]  [name]. is a 49 y.o. (02-Sep-1970) male who presents with chief complaint of scheduled permcath removal.   History of Present Illness:  Patient presents for a planned permcath removal. The patient is able to dialyze adequately from his dialysis access with out issue. The patient reports they're not been any problems with any of their dialysis runs. They are reporting good flows with good parameters at dialysis. Patient denies pain or tenderness overlying the access.  There is no pain with dialysis.  The patient denies hand pain or finger pain consistent with steal syndrome.  No fevers or chills while on dialysis. The patient no longer needs his dialysis catheter.  No current facility-administered medications for this encounter.   Past Medical History:  Diagnosis Date  . Anxiety   . Cellulitis   . CHF (congestive heart failure) ([place])   . Chronic diarrhea   . Chronic kidney disease   . Chronic pain of both ankles   . Diabetes mellitus without complication ([place])   . Diabetes mellitus, type II ([place])   . Diabetic neuropathy ([place])   . Frequent falls   . Gait instability   . GERD (gastroesophageal reflux disease)   . Heart palpitations   . History of kidney stones   . HLD (hyperlipidemia)   . Hypertension   . Insomnia   . Nausea and vomiting in adult    recurrent   Past Surgical History:  Procedure Laterality Date  . AMPUTATION Left 03/16/2016   Procedure: AMPUTATION DIGIT LEFT HALLUX;  Surgeon: Trula Slade, DPM;  Location: [place];  Service: Podiatry;  Laterality: Left;  can start around 5   . ARTHROSCOPIC REPAIR ACL Left   . AV FISTULA PLACEMENT Right 05/31/2019   Procedure: Brachiocephalic AV fistula creation;  Surgeon: Algernon Huxley, MD;  Location: [name];  Service: Vascular;  Laterality: Right;  . COLONOSCOPY WITH PROPOFOL N/A 10/28/2015   Procedure: COLONOSCOPY WITH PROPOFOL;   Surgeon: Lollie Sails, MD;  Location: [facility]COPY;  Service: Endoscopy;  Laterality: N/A;  . COLONOSCOPY WITH PROPOFOL N/A 10/29/2015   Procedure: COLONOSCOPY WITH PROPOFOL;  Surgeon: Lollie Sails, MD;  Location: [facility] ENDOSCOPY;  Service: Endoscopy;  Laterality: N/A;  . DIALYSIS/PERMA CATHETER INSERTION Right 04/26/2019   [facility]   . DIALYSIS/PERMA CATHETER INSERTION N/A 04/26/2019   Procedure: DIALYSIS/PERMA CATHETER INSERTION;  Surgeon: Algernon Huxley, MD;  Location: [place] CV [facility];  Service: Cardiovascular;  Laterality: N/A;  . ESOPHAGOGASTRODUODENOSCOPY (EGD) WITH PROPOFOL N/A 12/27/2017   Procedure: ESOPHAGOGASTRODUODENOSCOPY (EGD) WITH PROPOFOL;  Surgeon: Toledo, Benay Pike, MD;  Location: [name] ENDOSCOPY;  Service: Gastroenterology;  Laterality: N/A;  . PROSTATE SURGERY  2016  . TONSILECTOMY/ADENOIDECTOMY WITH MYRINGOTOMY    . TONSILLECTOMY     Social History Social History   Tobacco Use  . Smoking status: Never Smoker  . Smokeless tobacco: Never Used  Vaping Use  . Vaping Use: Never used  Substance Use Topics  . Alcohol use: No  . Drug use: No   Family History Family History  Problem Relation Age of Onset  . CAD Father   . Stroke Father   . Diabetes Mellitus II Mother   . Kidney failure Mother   . Schizophrenia Mother   No family history of bleeding or clotting disorders, autoimmune disease or porphyria.  Allergies  Allergen Reactions  . Levofloxacin Swelling    Of face  . Phenergan [  Promethazine Hcl] Other (See Comments)    Cramping all over   . Promethazine Diarrhea and Other (See Comments)    Other reaction(s): Other (See Comments), Other (See Comments) Other reaction(s): Muscle cramps Muscle cramps Cramping Other reaction(s): Muscle cramps Muscle cramps  . Malt Other (See Comments)    Other reaction(s): cramping  . Soy Allergy Other (See Comments)    Other reaction(s): swelling of face....grew out of it.  No precautions now    REVIEW OF  SYSTEMS (Negative unless checked)  Constitutional: [] Weight loss  [] Fever  [] Chills Cardiac: [] Chest pain   [] Chest pressure   [] Palpitations   [] Shortness of breath when laying flat   [] Shortness of breath at rest   [x] Shortness of breath with exertion. Vascular:  [] Pain in legs with walking   [] Pain in legs at rest   [] Pain in legs when laying flat   [] Claudication   [] Pain in feet when walking  [] Pain in feet at rest  [] Pain in feet when laying flat   [] History of DVT   [] Phlebitis   [] Swelling in legs   [] Varicose veins   [] Non-healing ulcers Pulmonary:   [] Uses home oxygen   [] Productive cough   [] Hemoptysis   [] Wheeze  [] COPD   [] Asthma Neurologic:  [] Dizziness  [] Blackouts   [] Seizures   [] History of stroke   [] History of TIA  [] Aphasia   [] Temporary blindness   [] Dysphagia   [] Weakness or numbness in arms   [] Weakness or numbness in legs Musculoskeletal:  [x] Arthritis   [] Joint swelling   [] Joint pain   [] Low back pain Hematologic:  [] Easy bruising  [] Easy bleeding   [] Hypercoagulable state   [] Anemic  [] Hepatitis Gastrointestinal:  [] Blood in stool   [] Vomiting blood  [] Gastroesophageal reflux/heartburn   [] Difficulty swallowing. Genitourinary:  [x] Chronic kidney disease   [] Difficult urination  [] Frequent urination  [] Burning with urination   [] Blood in urine Skin:  [] Rashes   [] Ulcers   [] Wounds Psychological:  [] History of anxiety   []  History of major depression.  Physical Examination  Vitals:   08/15/19 1357  BP: (!) 142/78  Pulse: 78  Resp: 16  Temp: 98.7 F (37.1 C)  TempSrc: Oral  SpO2: 98%   There is no height or weight on file to calculate BMI. Gen: WD/WN, NAD Head: [place]/AT, No temporalis wasting. Prominent temp pulse not noted. Ear/Nose/Throat: Hearing grossly intact, nares w/o erythema or drainage, oropharynx w/o Erythema/Exudate,  Eyes: Conjunctiva clear, sclera non-icteric Neck: Trachea midline.  No JVD.  Pulmonary:  Good air movement, respirations not labored, no  use of accessory muscles.  Cardiac: RRR, normal S1, S2. Vascular:   Right Permcath: Intact, no signs of infection  Vessel Right Left  Radial Palpable Palpable  Ulnar Not Palpable Not Palpable  Brachial Palpable Palpable  Carotid Palpable, without bruit Palpable, without bruit   Gastrointestinal: soft, non-tender/non-distended. No guarding/reflex.  Musculoskeletal: M/S 5/5 throughout.  Extremities without ischemic changes.  No deformity or atrophy.  Neurologic: Sensation grossly intact in extremities.  Symmetrical.  Speech is fluent. Motor exam as listed above. Psychiatric: Judgment intact, Mood & affect appropriate for pt's clinical situation. Dermatologic: No rashes or ulcers noted.  No cellulitis or open wounds. Lymph : No Cervical, Axillary, or Inguinal lymphadenopathy.  CBC Lab Results  Component Value Date   WBC 8.9 06/05/2019   HGB 8.2 (L) 06/05/2019   HCT 25.8 (L) 06/05/2019   MCV 78.2 (L) 06/05/2019   PLT 289 06/05/2019   BMET    Component Value Date/Time  NA 145 06/05/2019 0819   K 3.6 06/05/2019 0819   CL 107 06/05/2019 0819   CO2 29 06/05/2019 0819   GLUCOSE 90 06/05/2019 0819   BUN 35 (H) 06/05/2019 0819   CREATININE 6.20 (H) 06/05/2019 0819   CALCIUM 8.5 (L) 06/05/2019 0819   GFRNONAA 10 (L) 06/05/2019 0819   GFRAA 11 (L) 06/05/2019 0819   CrCl cannot be calculated (Patient's most recent lab result is older than the maximum 21 days allowed.).  COAG Lab Results  Component Value Date   INR 1.1 05/29/2019   Radiology VAS [place] (AVF, AVG)  Result Date: 07/17/2019 DIALYSIS ACCESS Reason for Exam: Routine follow up. Access Site: Right Upper Extremity. Access Type: Brachial-cephalic AVF. Performing Technologist: Charlane Ferretti RT (R)(VS)  Examination Guidelines: A complete evaluation includes B-mode imaging, spectral Doppler, color Doppler, and power Doppler as needed of all accessible portions of each vessel. Unilateral testing is  considered an integral part of a complete examination. Limited examinations for reoccurring indications may be performed as noted.  Findings: +--------------------+----------+-----------------+--------+ AVF                 PSV (cm/s)Flow Vol (mL/min)Comments +--------------------+----------+-----------------+--------+ Native artery inflow   485          2478                +--------------------+----------+-----------------+--------+ AVF Anastomosis        675                              +--------------------+----------+-----------------+--------+  +---------------+----------+-------------+----------+--------------------------+ OUTFLOW VEIN   PSV (cm/s)Diameter (cm)Depth (cm)         Describe          +---------------+----------+-------------+----------+--------------------------+ Subclavian vein    37                                                      +---------------+----------+-------------+----------+--------------------------+ Confluence        251                                                      +---------------+----------+-------------+----------+--------------------------+ Shoulder          137                                                      +---------------+----------+-------------+----------+--------------------------+ Prox UA           318                                                      +---------------+----------+-------------+----------+--------------------------+ Mid UA            223                                                      +---------------+----------+-------------+----------+--------------------------+  Dist UA           494                             change in Diameter and                                                        .43cm to 1.18cm       +---------------+----------+-------------+----------+--------------------------+  +---------------+-------------+---------+---------+----------+-----------------+                 Diameter (cm)  Depth  BranchingPSV (cm/s)   Flow Volume                                  (cm)                          (ml/min)      +---------------+-------------+---------+---------+----------+-----------------+ Right radial                                      67                      artery                                                                    +---------------+-------------+---------+---------+----------+-----------------+ Summary: Patent arteriovenous fistula.  Right anterior complex hematoma measuring approximately 3.91cm x 1.89cm x 4.72cm with no vascularture which is located anterior to the anastamosis site.  *See table(s) above for measurements and observations.  Diagnosing physician: [name] MD Electronically signed by [name] MD on 07/17/2019 at 4:57:41 PM.    --------------------------------------------------------------------------------   Final    Assessment/Plan Patient presents for scheduled permcath removal  1.  ESRD: The patient has an extremity access that is functioning well. Therefore, the patient will undergo removal of the tunneled catheter under local anesthesia.  The risks and benefits were described to the patient.  All questions were answered.  The patient agrees to proceed with angiography and intervention. Patient will continue dialysis therapy without further interruption if a successful intervention is not achieved then a tunneled catheter will be placed. Dialysis has already been arranged.  2.  Hypertension:  Patient will continue medical management; nephrology is following no changes in oral medications.  3. Diabetes mellitus:  Glucose will be monitored and oral medications been held this morning once the patient has undergone the patient's procedure po intake will be reinitiated and again Accu-Cheks will be used to assess the blood glucose level and treat as needed. The patient will be restarted on the patient's usual  hypoglycemic regime  Discussed with Dr. Francene Castle, PA-C  08/15/2019 2:26 PM

## 2019-08-15 NOTE — Progress Notes (Signed)
Operative Note  Preoperative diagnosis:    1. ESRD with functional permanent access  Postoperative diagnosis:   1. ESRD with functional permanent access  Procedure:   Removal of Right Permcath  Performing Clinician: Hezzie Bump PA-C  Surgeon:  Hortencia Pilar, MD  Anesthesia:  Local  EBL:  Minimal  Indication for the Procedure:  The patient has a functional permanent dialysis access and no longer needs their permcath.  This can be removed.  Risks and benefits are discussed and informed consent is obtained.  Description of the Procedure:  The patient's right neck, chest and existing catheter were sterilely prepped and draped. The area around the catheter was anesthetized copiously with 1% lidocaine. The catheter was dissected out with curved hemostats until the cuff was freed from the surrounding fibrous sheath. The fiber sheath was transected, and the catheter was then removed in its entirety using gentle traction. Pressure was held and sterile dressings were placed. The patient tolerated the procedure well and was taken to the recovery room in stable condition.  [name]  08/15/2019, 2:41 PM  This note was created with Dragon Medical transcription system. Any errors in dictation are purely unintentional.

## 2019-08-16 ENCOUNTER — Encounter: Payer: Self-pay | Admitting: Vascular Surgery

## 2019-08-27 ENCOUNTER — Emergency Department (HOSPITAL_COMMUNITY)
Admission: EM | Admit: 2019-08-27 | Discharge: 2019-08-27 | Disposition: A | Discharge status: Home / Self Care | Payer: Medicare (Managed Care) | Attending: Emergency Medicine | Admitting: Emergency Medicine

## 2019-08-27 ENCOUNTER — Encounter (HOSPITAL_COMMUNITY): Payer: Self-pay | Admitting: *Deleted

## 2019-08-27 ENCOUNTER — Other Ambulatory Visit: Payer: Self-pay

## 2019-08-27 DIAGNOSIS — Z79899 Other long term (current) drug therapy: Secondary | ICD-10-CM | POA: Diagnosis not present

## 2019-08-27 DIAGNOSIS — M7989 Other specified soft tissue disorders: Secondary | ICD-10-CM | POA: Diagnosis not present

## 2019-08-27 DIAGNOSIS — I130 Hypertensive heart and chronic kidney disease with heart failure and stage 1 through stage 4 chronic kidney disease, or unspecified chronic kidney disease: Secondary | ICD-10-CM | POA: Diagnosis not present

## 2019-08-27 DIAGNOSIS — R791 Abnormal coagulation profile: Secondary | ICD-10-CM | POA: Diagnosis not present

## 2019-08-27 DIAGNOSIS — E1140 Type 2 diabetes mellitus with diabetic neuropathy, unspecified: Secondary | ICD-10-CM | POA: Diagnosis not present

## 2019-08-27 DIAGNOSIS — R7989 Other specified abnormal findings of blood chemistry: Secondary | ICD-10-CM

## 2019-08-27 DIAGNOSIS — N189 Chronic kidney disease, unspecified: Secondary | ICD-10-CM | POA: Insufficient documentation

## 2019-08-27 DIAGNOSIS — R600 Localized edema: Secondary | ICD-10-CM

## 2019-08-27 DIAGNOSIS — I5032 Chronic diastolic (congestive) heart failure: Secondary | ICD-10-CM | POA: Diagnosis not present

## 2019-08-27 DIAGNOSIS — Z794 Long term (current) use of insulin: Secondary | ICD-10-CM | POA: Insufficient documentation

## 2019-08-27 DIAGNOSIS — E1122 Type 2 diabetes mellitus with diabetic chronic kidney disease: Secondary | ICD-10-CM | POA: Insufficient documentation

## 2019-08-27 LAB — CBC
HCT: 35.7 % — ABNORMAL LOW (ref 39.0–52.0)
Hemoglobin: 10.4 g/dL — ABNORMAL LOW (ref 13.0–17.0)
MCH: 22.5 pg — ABNORMAL LOW (ref 26.0–34.0)
MCHC: 29.1 g/dL — ABNORMAL LOW (ref 30.0–36.0)
MCV: 77.3 fL — ABNORMAL LOW (ref 80.0–100.0)
Platelets: 345 10*3/uL (ref 150–400)
RBC: 4.62 MIL/uL (ref 4.22–5.81)
RDW: 19.9 % — ABNORMAL HIGH (ref 11.5–15.5)
WBC: 9.3 10*3/uL (ref 4.0–10.5)
nRBC: 0 % (ref 0.0–0.2)

## 2019-08-27 LAB — BASIC METABOLIC PANEL
Anion gap: 13 (ref 5–15)
BUN: 87 mg/dL — ABNORMAL HIGH (ref 6–20)
CO2: 16 mmol/L — ABNORMAL LOW (ref 22–32)
Calcium: 8.4 mg/dL — ABNORMAL LOW (ref 8.9–10.3)
Chloride: 113 mmol/L — ABNORMAL HIGH (ref 98–111)
Creatinine, Ser: 12.62 mg/dL — ABNORMAL HIGH (ref 0.61–1.24)
GFR calc Af Amer: 5 mL/min — ABNORMAL LOW (ref >60)
GFR calc non Af Amer: 4 mL/min — ABNORMAL LOW (ref >60)
Glucose, Bld: 94 mg/dL (ref 70–99)
Potassium: 5.1 mmol/L (ref 3.5–5.1)
Sodium: 142 mmol/L (ref 135–145)

## 2019-08-27 LAB — D-DIMER, QUANTITATIVE: D-Dimer, Quant: 2.22 ug/mL-FEU — ABNORMAL HIGH (ref 0.00–0.50)

## 2019-08-27 MED ORDER — APIXABAN 5 MG PO TABS
10.0000 mg | ORAL_TABLET | Freq: Once | ORAL | 0 refills | Status: DC
Start: 2019-08-27 — End: 2020-02-28

## 2019-08-27 MED ORDER — ENOXAPARIN SODIUM 100 MG/ML ~~LOC~~ SOLN: 1.0000 mg/kg | Freq: Once | SUBCUTANEOUS | Status: DC

## 2019-08-27 MED ORDER — APIXABAN 5 MG PO TABS
10.0000 mg | ORAL_TABLET | Freq: Once | ORAL | Status: AC
  Administered 2019-08-27: 10 mg via ORAL
  Filled 2019-08-27: qty 2

## 2019-08-27 NOTE — ED Provider Notes (Addendum)
[facility] EMERGENCY DEPARTMENT Provider Note   CSN: [id number] Arrival date & time: 08/27/19  1114     History Chief Complaint  Patient presents with  . Leg Injury    [name]. is a 49 y.o. male.  Patient with a complaint of left leg swelling for the past 2 weeks.  It is gradually been getting worse.  He was seen in [facility] yesterday at an urgent care they were concerned about possible DVT.  They did do x-rays of his leg knee on down and found no injury.  Patient thought he had injured the leg stepping off a curb that would have been 2 weeks ago.  Patient is also a dialysis patient is normally dialyzed Tuesday Thursday Saturdays.  He is scheduled for dialysis tomorrow at 9 in the morning.  Patient denies any chest pain or shortness of breath.  Patient was not started on any blood thinners.  Or is not on any blood thinners.        Past Medical History:  Diagnosis Date  . Anxiety   . Cellulitis   . CHF (congestive heart failure) ([place])   . Chronic diarrhea   . Chronic kidney disease   . Chronic pain of both ankles   . Diabetes mellitus without complication ([place])   . Diabetes mellitus, type II ([place])   . Diabetic neuropathy ([place])   . Frequent falls   . Gait instability   . GERD (gastroesophageal reflux disease)   . Heart palpitations   . History of kidney stones   . HLD (hyperlipidemia)   . Hypertension   . Insomnia   . Nausea and vomiting in adult    recurrent    Patient Active Problem List   Diagnosis Date Noted  . Symptomatic anemia 06/05/2019  . Lymphedema 01/09/2018  . Chronic diastolic heart failure ([place]) 12/13/2017  . Vomiting 11/28/2017  . Acute on chronic heart failure ([place]) 11/23/2017  . Type 2 diabetes mellitus with chronic kidney disease on chronic dialysis ([place]) 11/23/2017  . Diabetic retinopathy ([place]) 07/05/2017  . Uncontrolled diabetes mellitus ([place]) 07/08/2016  . Uncontrolled type 2 diabetes mellitus with hyperglycemia, with  long-term current use of insulin ([place]) 04/20/2016  . Osteomyelitis of toe of left foot ([place]) 03/15/2016  . Acute on chronic renal failure ([place]) 06/23/2015  . Diarrhea 06/23/2015  . Mixed hyperlipidemia   . Essential hypertension, benign   . Heart palpitations 05/06/2015    Past Surgical History:  Procedure Laterality Date  . AMPUTATION Left 03/16/2016   Procedure: AMPUTATION DIGIT LEFT HALLUX;  Surgeon: Trula Slade, DPM;  Location: [place];  Service: Podiatry;  Laterality: Left;  can start around 5   . ARTHROSCOPIC REPAIR ACL Left   . AV FISTULA PLACEMENT Right 05/31/2019   Procedure: Brachiocephalic AV fistula creation;  Surgeon: Algernon Huxley, MD;  Location: [name] OR[facility];  Service: Vascular;  Laterality: Right;  . COLONOSCOPY WITH PROPOFOL N/A 10/28/2015   Procedure: COLONOSCOPY WITH PROPOFOL;  Surgeon: Lollie Sails, MD;  Location: [facility] ENDOSCOPY;  Service: Endoscopy;  Laterality: N/A;  . COLONOSCOPY WITH PROPOFOL N/A 10/29/2015   Procedure: COLONOSCOPY WITH PROPOFOL;  Surgeon: Lollie Sails, MD;  Location: [facility] ENDOSCOPY;  Service: Endoscopy;  Laterality: N/A;  . DIALYSIS/PERMA CATHETER INSERTION Right 04/26/2019   [facility]   . DIALYSIS/PERMA CATHETER INSERTION N/A 04/26/2019   Procedure: DIALYSIS/PERMA CATHETER INSERTION;  Surgeon: Algernon Huxley, MD;  Location: [place] [facility];  Service: Cardiovascular;  Laterality:  N/A;  . DIALYSIS/PERMA CATHETER REMOVAL N/A 08/15/2019   Procedure: DIALYSIS/PERMA CATHETER REMOVAL;  Surgeon: Katha Cabal, MD;  Location: [place] [facility];  Service: Cardiovascular;  Laterality: N/A;  . ESOPHAGOGASTRODUODENOSCOPY (EGD) WITH PROPOFOL N/A 12/27/2017   Procedure: ESOPHAGOGASTRODUODENOSCOPY (EGD) WITH PROPOFOL;  Surgeon: Toledo, Benay Pike, MD;  Location: [name] ENDOSCOPY;  Service: Gastroenterology;  Laterality: N/A;  . PROSTATE SURGERY  2016  . TONSILECTOMY/ADENOIDECTOMY WITH MYRINGOTOMY    . TONSILLECTOMY         Family History  Problem  Relation Age of Onset  . CAD Father   . Stroke Father   . Diabetes Mellitus II Mother   . Kidney failure Mother   . Schizophrenia Mother     Social History   Tobacco Use  . Smoking status: Never Smoker  . Smokeless tobacco: Never Used  Vaping Use  . Vaping Use: Never used  Substance Use Topics  . Alcohol use: No  . Drug use: No    Home Medications Prior to Admission medications   Medication Sig Start Date End Date Taking? Authorizing Provider  carvedilol (COREG) 25 MG tablet Take 25 mg by mouth every morning. 08/15/19  Yes [provider]  lidocaine-prilocaine (EMLA) cream Apply 1 application topically Every Tuesday,Thursday,and Saturday with dialysis.  08/15/19  Yes [provider]  linagliptin (TRADJENTA) 5 MG TABS tablet Take 1 tablet (5 mg total) by mouth daily. 04/25/19  Yes Nida, Marella Chimes, MD  lisinopril (PRINIVIL,ZESTRIL) 20 MG tablet Take 20 mg by mouth 2 (two) times daily.  10/04/17  Yes [provider]  omeprazole (PRILOSEC) 40 MG capsule Take 80 mg by mouth 2 (two) times a day.  12/27/17  Yes [provider]  prednisoLONE acetate (PRED FORTE) 1 % ophthalmic suspension Place 1 drop into the left eye every 4 (four) hours.   Yes [provider]  tamsulosin (FLOMAX) 0.4 MG CAPS capsule Take 0.4 mg by mouth daily after breakfast.    Yes [provider]  apixaban (ELIQUIS) 5 MG TABS tablet Take 2 tablets (10 mg total) by mouth once for 1 dose. 08/27/19 08/27/19  Fredia Sorrow, MD  HYDROcodone-acetaminophen (NORCO) 5-325 MG tablet Take 1 tablet by mouth every 6 (six) hours as needed for moderate pain. Patient not taking: Reported on 08/27/2019 05/31/19   Algernon Huxley, MD    Allergies    Levofloxacin, Promethazine, Malt, and Soy allergy  Review of Systems   Review of Systems  Constitutional: Negative for chills and fever.  HENT: Negative for rhinorrhea and sore throat.   Eyes: Negative for visual disturbance.   Respiratory: Negative for cough and shortness of breath.   Cardiovascular: Positive for leg swelling. Negative for chest pain.  Gastrointestinal: Negative for abdominal pain, diarrhea, nausea and vomiting.  Genitourinary: Negative for dysuria.  Musculoskeletal: Negative for back pain and neck pain.  Skin: Negative for rash.  Neurological: Negative for dizziness, light-headedness and headaches.  Hematological: Bruises/bleeds easily.  Psychiatric/Behavioral: Negative for confusion.    Physical Exam Updated Vital Signs BP 128/75 (BP Location: Left Arm)   Pulse 74   Temp 98.3 F (36.8 C) (Oral)   Resp 16   Ht 1.905 m (6\' 3" )   Wt 93 kg   SpO2 97%   BMI 25.62 kg/m   Physical Exam Vitals and nursing note reviewed.  Constitutional:      Appearance: Normal appearance. He is well-developed.  HENT:     Head: Normocephalic and atraumatic.  Eyes:  Extraocular Movements: Extraocular movements intact.     Conjunctiva/sclera: Conjunctivae normal.     Pupils: Pupils are equal, round, and reactive to light.  Cardiovascular:     Rate and Rhythm: Normal rate and regular rhythm.     Heart sounds: No murmur heard.   Pulmonary:     Effort: Pulmonary effort is normal. No respiratory distress.     Breath sounds: Normal breath sounds.  Abdominal:     Palpations: Abdomen is soft.     Tenderness: There is no abdominal tenderness.  Musculoskeletal:        General: Swelling present.     Cervical back: Normal range of motion and neck supple.     Left lower leg: Edema present.     Comments: AV fistula with good thrill right upper extremity.  Left lower extremity with significant edema to the level of the knee.  Does have cap refill to toes.  Skin:    General: Skin is warm and dry.     Capillary Refill: Capillary refill takes less than 2 seconds.  Neurological:     General: No focal deficit present.     Mental Status: He is alert and oriented to person, place, and time.     Cranial  Nerves: No cranial nerve deficit.     Sensory: No sensory deficit.     Motor: No weakness.     ED Results / Procedures / Treatments   Labs (all labs ordered are listed, but only abnormal results are displayed) Labs Reviewed  CBC - Abnormal; Notable for the following components:      Result Value   Hemoglobin 10.4 (*)    HCT 35.7 (*)    MCV 77.3 (*)    MCH 22.5 (*)    MCHC 29.1 (*)    RDW 19.9 (*)    All other components within normal limits  BASIC METABOLIC PANEL - Abnormal; Notable for the following components:   Chloride 113 (*)    CO2 16 (*)    BUN 87 (*)    Creatinine, Ser 12.62 (*)    Calcium 8.4 (*)    GFR calc non Af Amer 4 (*)    GFR calc Af Amer 5 (*)    All other components within normal limits  D-DIMER, QUANTITATIVE (NOT AT [facility]) - Abnormal; Notable for the following components:   D-Dimer, Quant 2.22 (*)    All other components within normal limits    EKG None  Radiology No results found.  Procedures Procedures (including critical care time)  Medications Ordered in ED Medications  apixaban (ELIQUIS) tablet 10 mg (has no administration in time range)    ED Course  I have reviewed the triage vital signs and the nursing notes.  Pertinent labs & imaging results that were available during my care of the patient were reviewed by me and considered in my medical decision making (see chart for details).    MDM Rules/Calculators/A&P                          Doppler studies not available today.  So patient will return tomorrow for ultrasound left lower extremity.  Based on this we did do a D-dimer was significantly elevated at 2.2.  Often patients that are receiving dialysis have elevated D-dimers.  But based on the clinical findings of his left lower extremity is highly suspicious for DVT.  We will start him on Eliquis.  Consulted with pharmacy and  they recommended 10 mg now and 10 mg in the morning.  And then going from there based on his Doppler studies.   Patient without any chest pain or shortness of breath no hypoxia.  Patient also scheduled for dialysis tomorrow morning. Final Clinical Impression(s) / ED Diagnoses Final diagnoses:  Left leg swelling  Elevated d-dimer    Rx / DC Orders ED Discharge Orders         Ordered    US Venous Img Lower Unilateral Left     Discontinue     08/27/19 1630    apixaban (ELIQUIS) 5 MG TABS tablet   Once     Discontinue  Reprint     08/27/19 1657           Fredia Sorrow, MD 08/27/19 1702    Fredia Sorrow, MD 08/27/19 1702

## 2019-08-27 NOTE — ED Triage Notes (Signed)
Left leg swelling x 2 weeks, states he stepped off the curb wrong and injured ankle. States he had xrays done and  Is now concerned he may have a blood clot

## 2019-08-27 NOTE — Discharge Instructions (Addendum)
Take the Eliquis tablet tomorrow morning.  Return here after dialysis to have your ultrasound study of your left leg to further evaluate for possible deep vein thrombosis.  Return for any new or worse symptoms.  Return for any chest pain or shortness of breath.

## 2019-08-29 ENCOUNTER — Other Ambulatory Visit: Payer: Self-pay

## 2019-08-29 ENCOUNTER — Ambulatory Visit (HOSPITAL_COMMUNITY)
Admission: RE | Admit: 2019-08-29 | Discharge: 2019-08-29 | Disposition: A | Discharge status: Home / Self Care | Payer: Medicare (Managed Care) | Source: Ambulatory Visit | Attending: Emergency Medicine | Admitting: Emergency Medicine

## 2019-08-29 DIAGNOSIS — R600 Localized edema: Secondary | ICD-10-CM | POA: Insufficient documentation

## 2019-08-30 LAB — HEMOGLOBIN A1C: Hemoglobin A1C: 5.2

## 2019-08-30 NOTE — ED Provider Notes (Signed)
Patient with significant medical history of end-stage renal disease on dialysis, diabetes, hyperlipidemia, hypertension, anxiety was seen on 07/05 with primary complaint of left leg swelling that has been going on for two weeks. He was worked up for possible DVT, D-dimer was performed showed elevated level of 2.2 and a DVT ultrasound study was ordered but was unavailable.  patient returned today 07/06 to have the test performed.   Result were negative for a DVT showed dependent edema in left leg.  Patient was informed of the results, given at home instructions as well as strict return precautions.  Patient was instructed to stop taking anticoagulants and all questions were answered.   Marcello Fennel, PA-C 08/30/19 1101    Davonna Belling, MD 08/31/19 [phone]

## 2019-08-31 ENCOUNTER — Ambulatory Visit: Payer: Medicare (Managed Care) | Admitting: "Endocrinology

## 2019-09-07 ENCOUNTER — Other Ambulatory Visit: Payer: Self-pay

## 2019-09-07 ENCOUNTER — Encounter: Payer: Self-pay | Admitting: "Endocrinology

## 2019-09-07 ENCOUNTER — Ambulatory Visit (INDEPENDENT_AMBULATORY_CARE_PROVIDER_SITE_OTHER): Payer: Medicare (Managed Care) | Admitting: "Endocrinology

## 2019-09-07 VITALS — BP 149/80 | HR 76 | Ht 75.0 in | Wt 206.8 lb

## 2019-09-07 DIAGNOSIS — I10 Essential (primary) hypertension: Secondary | ICD-10-CM

## 2019-09-07 DIAGNOSIS — E782 Mixed hyperlipidemia: Secondary | ICD-10-CM

## 2019-09-07 DIAGNOSIS — Z794 Long term (current) use of insulin: Secondary | ICD-10-CM

## 2019-09-07 DIAGNOSIS — N184 Chronic kidney disease, stage 4 (severe): Secondary | ICD-10-CM

## 2019-09-07 DIAGNOSIS — E1122 Type 2 diabetes mellitus with diabetic chronic kidney disease: Secondary | ICD-10-CM | POA: Diagnosis not present

## 2019-09-07 NOTE — Progress Notes (Signed)
09/07/2019, 12:17 PM   Endocrinology follow-up note  Subjective:    Patient ID: [name]., male    DOB: [birth year].  [name]. is being seen in follow-up after he was seen in consultation for management of currently uncontrolled symptomatic diabetes requested by  [name], MD.   Past Medical History:  Diagnosis Date  . Anxiety   . Cellulitis   . CHF (congestive heart failure) ([place])   . Chronic diarrhea   . Chronic kidney disease   . Chronic pain of both ankles   . Diabetes mellitus without complication ([place])   . Diabetes mellitus, type II ([place])   . Diabetic neuropathy ([place])   . Frequent falls   . Gait instability   . GERD (gastroesophageal reflux disease)   . Heart palpitations   . History of kidney stones   . HLD (hyperlipidemia)   . Hypertension   . Insomnia   . Nausea and vomiting in adult    recurrent    Past Surgical History:  Procedure Laterality Date  . AMPUTATION Left 03/16/2016   Procedure: AMPUTATION DIGIT LEFT HALLUX;  Surgeon: Trula Slade, DPM;  Location: [place];  Service: Podiatry;  Laterality: Left;  can start around 5   . ARTHROSCOPIC REPAIR ACL Left   . AV FISTULA PLACEMENT Right 05/31/2019   Procedure: Brachiocephalic AV fistula creation;  Surgeon: Algernon Huxley, MD;  Location: [name];  Service: Vascular;  Laterality: Right;  . COLONOSCOPY WITH PROPOFOL N/A 10/28/2015   Procedure: COLONOSCOPY WITH PROPOFOL;  Surgeon: Lollie Sails, MD;  Location: [facility];  Service: Endoscopy;  Laterality: N/A;  . COLONOSCOPY WITH PROPOFOL N/A 10/29/2015   Procedure: COLONOSCOPY WITH PROPOFOL;  Surgeon: Lollie Sails, MD;  Location: [facility];  Service: Endoscopy;  Laterality: N/A;  . DIALYSIS/PERMA CATHETER INSERTION Right 04/26/2019   [facility]   . DIALYSIS/PERMA CATHETER INSERTION N/A 04/26/2019   Procedure: DIALYSIS/PERMA CATHETER  INSERTION;  Surgeon: Algernon Huxley, MD;  Location: [place] [facility];  Service: Cardiovascular;  Laterality: N/A;  . DIALYSIS/PERMA CATHETER REMOVAL N/A 08/15/2019   Procedure: DIALYSIS/PERMA CATHETER REMOVAL;  Surgeon: Katha Cabal, MD;  Location: [place] [facility];  Service: Cardiovascular;  Laterality: N/A;  . ESOPHAGOGASTRODUODENOSCOPY (EGD) WITH PROPOFOL N/A 12/27/2017   Procedure: ESOPHAGOGASTRODUODENOSCOPY (EGD) WITH PROPOFOL;  Surgeon: Toledo, Benay Pike, MD;  Location: [name] ENDOSCOPY;  Service: Gastroenterology;  Laterality: N/A;  . PROSTATE SURGERY  2016  . TONSILECTOMY/ADENOIDECTOMY WITH MYRINGOTOMY    . TONSILLECTOMY      Social History   Socioeconomic History  . Marital status: Married    Spouse name: [name]   . Number of children: 2  . Years of education: Not on file  . Highest education level: High school graduate  Occupational History  . Occupation: Disability    Comment: not employed  Tobacco Use  . Smoking status: Never Smoker  . Smokeless tobacco: Never Used  Vaping Use  . Vaping Use: Never used  Substance and Sexual Activity  . Alcohol use: No  . Drug use: No  . Sexual activity: Yes  Other Topics Concern  . Not on file  Social History Narrative   Oldest son killed in car crash June 2020.    Social Determinants of Health   Financial Resource Strain:   . Difficulty of Paying Living Expenses:   Food Insecurity:   . Worried About Charity fundraiser in the Last Year:   . Arboriculturist in the Last Year:   Transportation Needs:   . Film/video editor (Medical):   Marland Kitchen Lack of Transportation (Non-Medical):   Physical Activity:   . Days of Exercise per Week:   . Minutes of Exercise per Session:   Stress:   . Feeling of Stress :   Social Connections:   . Frequency of Communication with Friends and Family:   . Frequency of Social Gatherings with Friends and Family:   . Attends Religious Services:   . Active Member of Clubs or Organizations:    . Attends Archivist Meetings:   Marland Kitchen Marital Status:     Family History  Problem Relation Age of Onset  . CAD Father   . Stroke Father   . Diabetes Mellitus II Mother   . Kidney failure Mother   . Schizophrenia Mother     Outpatient Encounter Medications as of 09/07/2019  Medication Sig  . apixaban (ELIQUIS) 5 MG TABS tablet Take 2 tablets (10 mg total) by mouth once for 1 dose.  . carvedilol (COREG) 25 MG tablet Take 25 mg by mouth every morning.  Marland Kitchen HYDROcodone-acetaminophen (NORCO) 5-325 MG tablet Take 1 tablet by mouth every 6 (six) hours as needed for moderate pain. (Patient not taking: Reported on 08/27/2019)  . lidocaine-prilocaine (EMLA) cream Apply 1 application topically Every Tuesday,Thursday,and Saturday with dialysis.   Marland Kitchen lisinopril (PRINIVIL,ZESTRIL) 20 MG tablet Take 20 mg by mouth 2 (two) times daily.   Marland Kitchen omeprazole (PRILOSEC) 40 MG capsule Take 80 mg by mouth 2 (two) times a day.   . prednisoLONE acetate (PRED FORTE) 1 % ophthalmic suspension Place 1 drop into the left eye every 4 (four) hours.  . tamsulosin (FLOMAX) 0.4 MG CAPS capsule Take 0.4 mg by mouth daily after breakfast.   . [DISCONTINUED] linagliptin (TRADJENTA) 5 MG TABS tablet Take 1 tablet (5 mg total) by mouth daily.   No facility-administered encounter medications on file as of 09/07/2019.    ALLERGIES: Allergies  Allergen Reactions  . Levofloxacin Swelling    Of face  . Promethazine Diarrhea and Other (See Comments)    Other reaction(s): Other (See Comments), Other (See Comments) Other reaction(s): Muscle cramps Muscle cramps Cramping Other reaction(s): Muscle cramps Muscle cramps  . Malt Other (See Comments)    Other reaction(s): cramping  . Soy Allergy Other (See Comments)    Other reaction(s): swelling of face....grew out of it.  No precautions now     VACCINATION STATUS: Immunization History  Administered Date(s) Administered  . Influenza,inj,Quad PF,6+ Mos 03/17/2016,  11/09/2017  . Pneumococcal Polysaccharide-23 03/10/2015, 11/09/2017  . Tdap 10/14/2017    Diabetes He presents for his follow-up diabetic visit. He has type 2 diabetes mellitus. Onset time: He was diagnosed at approximate age of 33 years. His disease course has been stable (He did have 3 A1c measurements between 2017 in 2019 which are 15.6%, 15%, and 15.2%.  However, his most recent A1c from November 2020 is 5.9%.). There are no hypoglycemic associated symptoms. Pertinent negatives for hypoglycemia include no confusion, headaches, pallor or seizures. Pertinent negatives for diabetes include no blurred  vision, no chest pain, no fatigue, no polydipsia, no polyphagia, no polyuria and no weakness. There are no hypoglycemic complications. Symptoms are stable. Diabetic complications include nephropathy and retinopathy. (He also has gastroparesis working with GI specialist) Risk factors for coronary artery disease include diabetes mellitus, dyslipidemia, family history, hypertension, male sex, obesity and sedentary lifestyle. Current diabetic treatment includes insulin injections (Is currently on Lantus 14 units every morning, and NovoLog 0-6 units 3 times daily AC.). His weight is fluctuating minimally (He gives history of weighing up to 333 pounds few years ago, progressively losing down to 219 pounds today.). He is following a generally unhealthy diet. When asked about meal planning, he reported none. He has not had a previous visit with a dietitian. He participates in exercise intermittently. His breakfast blood glucose range is generally 130-140 mg/dl. His overall blood glucose range is 130-140 mg/dl. (He does not monitor blood glucose regularly, however recent A1c was reported to be 5.2%, 5.6% during his last visit.  ) An ACE inhibitor/angiotensin II receptor blocker is being taken. Eye exam is current.  Hyperlipidemia This is a chronic problem. The problem is controlled. Exacerbating diseases include chronic  renal disease and diabetes. Pertinent negatives include no chest pain, myalgias or shortness of breath. Risk factors for coronary artery disease include diabetes mellitus, dyslipidemia, family history, hypertension, male sex and a sedentary lifestyle.  Hypertension This is a chronic problem. The problem is controlled. Pertinent negatives include no blurred vision, chest pain, headaches, neck pain, palpitations or shortness of breath. Past treatments include ACE inhibitors. Hypertensive end-organ damage includes kidney disease and retinopathy. Identifiable causes of hypertension include chronic renal disease.     Review of Systems  Constitutional: Negative for chills, fatigue, fever and unexpected weight change.  HENT: Negative for dental problem, mouth sores and trouble swallowing.   Eyes: Negative for blurred vision and visual disturbance.  Respiratory: Negative for cough, choking, chest tightness, shortness of breath and wheezing.   Cardiovascular: Negative for chest pain, palpitations and leg swelling.  Gastrointestinal: Positive for abdominal distention, nausea and vomiting. Negative for abdominal pain, constipation and diarrhea.  Endocrine: Negative for polydipsia, polyphagia and polyuria.  Genitourinary: Negative for dysuria, flank pain, hematuria and urgency.  Musculoskeletal: Negative for back pain, gait problem, myalgias and neck pain.  Skin: Negative for pallor, rash and wound.  Neurological: Negative for seizures, syncope, weakness, numbness and headaches.  Psychiatric/Behavioral: Negative for confusion and dysphoric mood.    Objective:    BP (!) 149/80   Pulse 76   Ht 6\' 3"  (1.905 m)   Wt 206 lb 12.8 oz (93.8 kg)   BMI 25.85 kg/m   Wt Readings from Last 3 Encounters:  09/07/19 206 lb 12.8 oz (93.8 kg)  08/27/19 205 lb (93 kg)  07/17/19 197 lb (89.4 kg)     Physical Exam Constitutional:      General: He is not in acute distress.    Appearance: He is well-developed.   HENT:     Head: Normocephalic and atraumatic.  Neck:     Thyroid: No thyromegaly.     Trachea: No tracheal deviation.  Cardiovascular:     Rate and Rhythm: Normal rate.     Pulses:          Dorsalis pedis pulses are 1+ on the right side and 1+ on the left side.       Posterior tibial pulses are 1+ on the right side and 1+ on the left side.     Heart  sounds: S1 normal and S2 normal. No murmur heard.  No gallop.   Pulmonary:     Effort: Pulmonary effort is normal. No respiratory distress.     Breath sounds: No wheezing.  Abdominal:     General: There is no distension.     Tenderness: There is no abdominal tenderness. There is no guarding.  Musculoskeletal:     Right shoulder: No swelling or deformity.     Cervical back: Normal range of motion and neck supple.  Skin:    General: Skin is warm and dry.     Findings: No rash.     Nails: There is no clubbing.  Neurological:     Mental Status: He is alert and oriented to person, place, and time.     Cranial Nerves: No cranial nerve deficit.     Sensory: No sensory deficit.     Gait: Gait normal.     Deep Tendon Reflexes: Reflexes are normal and symmetric.  Psychiatric:        Speech: Speech normal.        Behavior: Behavior normal. Behavior is cooperative.        Thought Content: Thought content normal.        Judgment: Judgment normal.     CMP ( most recent) CMP     Component Value Date/Time   NA 142 08/27/2019 1540   K 5.1 08/27/2019 1540   CL 113 (H) 08/27/2019 1540   CO2 16 (L) 08/27/2019 1540   GLUCOSE 94 08/27/2019 1540   BUN 87 (H) 08/27/2019 1540   CREATININE 12.62 (H) 08/27/2019 1540   CALCIUM 8.4 (L) 08/27/2019 1540   PROT 5.7 (L) 09/13/2018 1442   ALBUMIN 2.4 (L) 09/13/2018 1442   AST 14 (L) 09/13/2018 1442   ALT 17 09/13/2018 1442   ALKPHOS 51 09/13/2018 1442   BILITOT 0.6 09/13/2018 1442   GFRNONAA 4 (L) 08/27/2019 1540   GFRAA 5 (L) 08/27/2019 1540     Diabetic Labs (most recent): Lab Results   Component Value Date   HGBA1C 5.2 08/30/2019   HGBA1C 5.6 04/25/2019   HGBA1C 5.9 12/26/2018     Lipid Panel ( most recent) Lipid Panel     Component Value Date/Time   CHOL 162 04/25/2019 1159   TRIG 77 04/25/2019 1159   HDL 43 04/25/2019 1159   CHOLHDL 3.8 04/25/2019 1159   VLDL 19 06/24/2015 0508   LDLCALC 102 (H) 04/25/2019 1159      Lab Results  Component Value Date   TSH 2.14 04/25/2019   TSH 2.153 03/06/2015   FREET4 1.1 04/25/2019      Assessment & Plan:   1. Type 2 diabetes mellitus with stage 4 chronic kidney disease, with long-term current use of insulin ([place]) [name]. has complicated, currently controlled symptomatic type 2 DM since  49 years of age.  He does not monitor blood glucose regularly, however recent A1c was reported to be 5.2%, 5.6% during his last visit.     Previously, she did have several years of uncontrolled diabetes with A1c as high as 15+ percent.  Recent labs reviewed. - I had a long discussion with him about the progressive nature of diabetes and the pathology behind its complications. -his diabetes is complicated by  stage 3-4 renal insufficiency , retinopathy, gastroparesis and he remains at a high risk for more acute and chronic complications which include CAD, CVA, CKD, retinopathy, and neuropathy. These are all discussed in detail with him.  -  I have counseled him on diet  and weight management  by adopting a carbohydrate restricted/protein rich diet. Patient is encouraged to switch to  unprocessed or minimally processed     complex starch and increased protein intake (animal or plant source), fruits, and vegetables. -  he is advised to stick to a routine mealtimes to eat 3 meals  a day and avoid unnecessary snacks ( to snack only to correct hypoglycemia).   - he  admits there is a room for improvement in his diet and drink choices. -  Suggestion is made for him to avoid simple carbohydrates  from his diet including Cakes,  Sweet Desserts / Pastries, Ice Cream, Soda (diet and regular), Sweet Tea, Candies, Chips, Cookies, Sweet Pastries,  Store Bought Juices, Alcohol in Excess of  1-2 drinks a day, Artificial Sweeteners, Coffee Creamer, and "Sugar-free" Products. This will help patient to have stable blood glucose profile and potentially avoid unintended weight gain.  - he will be scheduled with Jearld Fenton, RDN, CDE for diabetes education.  - I have approached him with the following individualized plan to manage  his diabetes and patient agrees:   -Given his presentation with tightly controlled glycemia with A1c of 5.2%, he will continue to benefit from de-escalation of medications.  He is advised to finish his current supplies of Tradjenta 5 mg p.o. daily and discontinue.   - he is encouraged to call clinic for blood glucose levels less than 70 or above 200 mg /dl.  - he is not a candidate for metformin, SGLT2 inhibitors due to concurrent renal insufficiency.  - Specific targets for  A1c;  LDL, HDL, Triglycerides, and  Waist Circumference were discussed with the patient.  2) Blood Pressure /Hypertension:   His blood pressure is not controlled to target.   he is advised to continue his current medications including lisinopril 20 mg p.o. daily with breakfast .  I deferred blood pressure medication adjustment to nephrology.  3) Lipids/Hyperlipidemia:   Review of his recent lipid panel showed  controlled  LDL at 43 .  he  is advised to continue rosuvastatin 40 mg daily at bedtime.  Side effects and precautions discussed with him.  4)  Weight/Diet:  Body mass index is 25.85 kg/m.  -Patient gives history of losing 100+ pounds over the years, not a weight loss candidate immediately.     Exercise, and detailed carbohydrates information provided  -  detailed on discharge instructions.  5) Chronic Care/Health Maintenance:  -he  is on ACEI/ARB and Statin medications and  is encouraged to initiate and continue to follow  up with Ophthalmology, Dentist,  Podiatrist at least yearly or according to recommendations, and advised to   stay away from smoking. I have recommended yearly flu vaccine and pneumonia vaccine at least every 5 years; moderate intensity exercise for up to 150 minutes weekly; and  sleep for at least 7 hours a day.  - he is  advised to maintain close follow up with [name], MD for primary care needs, as well as his other providers for optimal and coordinated care.  - Time spent on this patient care encounter:  35 min, of which > 50% was spent in  counseling and the rest reviewing his blood glucose logs , discussing his hypoglycemia and hyperglycemia episodes, reviewing his current and  previous labs / studies  ( including abstraction from other facilities) and medications  doses and developing a  long term treatment plan and documenting his care.  Please refer to Patient Instructions for Blood Glucose Monitoring and Insulin/Medications Dosing Guide"  in media tab for additional information. Please  also refer to " Patient Self Inventory" in the Media  tab for reviewed elements of pertinent patient history.  Deyvi Senaida Macdonald. participated in the discussions, expressed understanding, and voiced agreement with the above plans.  All questions were answered to his satisfaction. he is encouraged to contact clinic should he have any questions or concerns prior to his return visit.   Follow up plan: - Return in about 4 months (around 01/08/2020) for [place] A1c in Office.  Glade Lloyd, MD [facility] [address] [place], [place] [address] Phone: [phone]  Fax: [phone]    09/07/2019, 12:17 PM  This note was partially dictated with voice recognition software. Similar sounding words can be transcribed inadequately or may not  be corrected upon review.

## 2019-09-07 NOTE — Patient Instructions (Signed)

## 2019-10-30 ENCOUNTER — Ambulatory Visit: Payer: Medicare (Managed Care) | Admitting: Orthopaedic Surgery

## 2019-11-06 ENCOUNTER — Ambulatory Visit: Payer: Medicare (Managed Care) | Admitting: Orthopaedic Surgery

## 2019-11-07 DIAGNOSIS — N401 Enlarged prostate with lower urinary tract symptoms: Secondary | ICD-10-CM

## 2019-11-07 HISTORY — DX: Benign prostatic hyperplasia with lower urinary tract symptoms: N40.1

## 2020-01-09 ENCOUNTER — Ambulatory Visit: Payer: Medicare (Managed Care) | Admitting: "Endocrinology

## 2020-01-28 ENCOUNTER — Ambulatory Visit: Payer: Medicare (Managed Care) | Admitting: Podiatry

## 2020-02-14 ENCOUNTER — Other Ambulatory Visit (INDEPENDENT_AMBULATORY_CARE_PROVIDER_SITE_OTHER): Payer: Self-pay | Admitting: Nurse Practitioner

## 2020-02-14 DIAGNOSIS — Z992 Dependence on renal dialysis: Secondary | ICD-10-CM

## 2020-02-19 ENCOUNTER — Encounter (INDEPENDENT_AMBULATORY_CARE_PROVIDER_SITE_OTHER): Payer: Self-pay | Admitting: Nurse Practitioner

## 2020-02-19 ENCOUNTER — Ambulatory Visit (INDEPENDENT_AMBULATORY_CARE_PROVIDER_SITE_OTHER): Payer: Medicare (Managed Care)

## 2020-02-19 ENCOUNTER — Other Ambulatory Visit: Payer: Self-pay

## 2020-02-19 ENCOUNTER — Ambulatory Visit (INDEPENDENT_AMBULATORY_CARE_PROVIDER_SITE_OTHER): Payer: Medicare (Managed Care) | Admitting: Nurse Practitioner

## 2020-02-19 VITALS — BP 181/74 | HR 89 | Resp 16 | Wt 212.0 lb

## 2020-02-19 DIAGNOSIS — Z992 Dependence on renal dialysis: Secondary | ICD-10-CM | POA: Diagnosis not present

## 2020-02-19 DIAGNOSIS — N186 End stage renal disease: Secondary | ICD-10-CM

## 2020-02-19 DIAGNOSIS — E782 Mixed hyperlipidemia: Secondary | ICD-10-CM | POA: Diagnosis not present

## 2020-02-19 DIAGNOSIS — I10 Essential (primary) hypertension: Secondary | ICD-10-CM

## 2020-02-19 DIAGNOSIS — E1165 Type 2 diabetes mellitus with hyperglycemia: Secondary | ICD-10-CM | POA: Diagnosis not present

## 2020-02-19 DIAGNOSIS — Z794 Long term (current) use of insulin: Secondary | ICD-10-CM

## 2020-02-19 NOTE — Progress Notes (Signed)
Subjective:    Patient ID: [name]., male    DOB: [birth year], 49 y.o.   MRN: [id number] Chief Complaint  Patient presents with  . Follow-up    6 month ultrasound follow up    The patient returns to the office for followup of their dialysis access. The function of the access has been stable. The patient denies increased bleeding time or increased recirculation. Patient denies difficulty with cannulation. The patient denies hand pain or other symptoms consistent with steal phenomena.  No significant arm swelling.  The patient denies redness or swelling at the access site. The patient denies fever or chills at home or while on dialysis.  Patient does note that he is in the process of getting a kidney from his wife.  They are still within the work-up phases however it is hopeful that she will be a successful match.  The patient denies amaurosis fugax or recent TIA symptoms. There are no recent neurological changes noted. The patient denies claudication symptoms or rest pain symptoms. The patient denies history of DVT, PE or superficial thrombophlebitis. The patient denies recent episodes of angina or shortness of breath.     Today the patient has a flow volume of 2641.  No areas of obvious stenosis.  Good fistula overall.   Review of Systems  Neurological: Positive for weakness.  All other systems reviewed and are negative.      Objective:   Physical Exam Vitals reviewed.  HENT:     Head: Normocephalic.  Cardiovascular:     Rate and Rhythm: Normal rate.     Pulses: Normal pulses.          Radial pulses are 2+ on the right side.     Arteriovenous access: right arteriovenous access is present.    Comments: Good thrill and bruit Pulmonary:     Effort: Pulmonary effort is normal.  Neurological:     Mental Status: He is alert and oriented to person, place, and time.  Psychiatric:        Mood and Affect: Mood normal.        Behavior: Behavior normal.        Thought  Content: Thought content normal.        Judgment: Judgment normal.     BP (!) 181/74 (BP Location: Right Arm)   Pulse 89   Resp 16   Wt 212 lb (96.2 kg)   BMI 26.50 kg/m   Past Medical History:  Diagnosis Date  . Anxiety   . Cellulitis   . CHF (congestive heart failure) ([place])   . Chronic diarrhea   . Chronic kidney disease   . Chronic pain of both ankles   . Diabetes mellitus without complication ([place])   . Diabetes mellitus, type II ([place])   . Diabetic neuropathy ([place])   . Frequent falls   . Gait instability   . GERD (gastroesophageal reflux disease)   . Heart palpitations   . History of kidney stones   . HLD (hyperlipidemia)   . Hypertension   . Insomnia   . Nausea and vomiting in adult    recurrent    Social History   Socioeconomic History  . Marital status: Married    Spouse name: [name]   . Number of children: 2  . Years of education: Not on file  . Highest education level: High school graduate  Occupational History  . Occupation: Disability    Comment: not employed  Tobacco Use  . Smoking  status: Never Smoker  . Smokeless tobacco: Never Used  Vaping Use  . Vaping Use: Never used  Substance and Sexual Activity  . Alcohol use: No  . Drug use: No  . Sexual activity: Yes  Other Topics Concern  . Not on file  Social History Narrative   Oldest son killed in car crash June 2020.    Social Determinants of Health   Financial Resource Strain: Not on file  Food Insecurity: Not on file  Transportation Needs: Not on file  Physical Activity: Not on file  Stress: Not on file  Social Connections: Not on file  Intimate Partner Violence: Not on file    Past Surgical History:  Procedure Laterality Date  . AMPUTATION Left 03/16/2016   Procedure: AMPUTATION DIGIT LEFT HALLUX;  Surgeon: Trula Slade, DPM;  Location: [place];  Service: Podiatry;  Laterality: Left;  can start around 5   . ARTHROSCOPIC REPAIR ACL Left   . AV FISTULA PLACEMENT Right 05/31/2019    Procedure: Brachiocephalic AV fistula creation;  Surgeon: Algernon Huxley, MD;  Location: [name];  Service: Vascular;  Laterality: Right;  . COLONOSCOPY WITH PROPOFOL N/A 10/28/2015   Procedure: COLONOSCOPY WITH PROPOFOL;  Surgeon: Lollie Sails, MD;  Location: [facility] ENDOSCOPY;  Service: Endoscopy;  Laterality: N/A;  . COLONOSCOPY WITH PROPOFOL N/A 10/29/2015   Procedure: COLONOSCOPY WITH PROPOFOL;  Surgeon: Lollie Sails, MD;  Location: [facility] ENDOSCOPY;  Service: Endoscopy;  Laterality: N/A;  . DIALYSIS/PERMA CATHETER INSERTION Right 04/26/2019   [facility]   . DIALYSIS/PERMA CATHETER INSERTION N/A 04/26/2019   Procedure: DIALYSIS/PERMA CATHETER INSERTION;  Surgeon: Algernon Huxley, MD;  Location: [place] [facility];  Service: Cardiovascular;  Laterality: N/A;  . DIALYSIS/PERMA CATHETER REMOVAL N/A 08/15/2019   Procedure: DIALYSIS/PERMA CATHETER REMOVAL;  Surgeon: Katha Cabal, MD;  Location: [place] [facility];  Service: Cardiovascular;  Laterality: N/A;  . ESOPHAGOGASTRODUODENOSCOPY (EGD) WITH PROPOFOL N/A 12/27/2017   Procedure: ESOPHAGOGASTRODUODENOSCOPY (EGD) WITH PROPOFOL;  Surgeon: Toledo, Benay Pike, MD;  Location: [name] ENDOSCOPY;  Service: Gastroenterology;  Laterality: N/A;  . PROSTATE SURGERY  2016  . TONSILECTOMY/ADENOIDECTOMY WITH MYRINGOTOMY    . TONSILLECTOMY      Family History  Problem Relation Age of Onset  . CAD Father   . Stroke Father   . Diabetes Mellitus II Mother   . Kidney failure Mother   . Schizophrenia Mother     Allergies  Allergen Reactions  . Levofloxacin Swelling    Of face  . Promethazine Diarrhea and Other (See Comments)    Other reaction(s): Other (See Comments), Other (See Comments) Other reaction(s): Muscle cramps Muscle cramps Cramping Other reaction(s): Muscle cramps Muscle cramps  . Malt Other (See Comments)    Other reaction(s): cramping  . Soy Allergy Other (See Comments)    Other reaction(s): swelling of face....grew out of it.   No precautions now     CBC Latest Ref Rng & Units 08/27/2019 06/05/2019 06/05/2019  WBC 4.0 - 10.5 K/uL 9.3 8.9 -  Hemoglobin 13.0 - 17.0 g/dL 10.4(L) 8.2(L) 7.6(L)  Hematocrit 39.0 - 52.0 % 35.7(L) 25.8(L) 24.8(L)  Platelets 150 - 400 K/uL 345 289 -      CMP     Component Value Date/Time   NA 142 08/27/2019 1540   K 5.1 08/27/2019 1540   CL 113 (H) 08/27/2019 1540   CO2 16 (L) 08/27/2019 1540   GLUCOSE 94 08/27/2019 1540   BUN 87 (H) 08/27/2019 1540  CREATININE 12.62 (H) 08/27/2019 1540   CALCIUM 8.4 (L) 08/27/2019 1540   PROT 5.7 (L) 09/13/2018 1442   ALBUMIN 2.4 (L) 09/13/2018 1442   AST 14 (L) 09/13/2018 1442   ALT 17 09/13/2018 1442   ALKPHOS 51 09/13/2018 1442   BILITOT 0.6 09/13/2018 1442   GFRNONAA 4 (L) 08/27/2019 1540   GFRAA 5 (L) 08/27/2019 1540     No results found.     Assessment & Plan:   1. ESRD on dialysis [facility]) Recommend:  The patient is doing well and currently has adequate dialysis access. The patient's dialysis center is not reporting any access issues. Flow pattern is stable when compared to the prior ultrasound.  The patient should have a duplex ultrasound of the dialysis access in 6 months. The patient will follow-up with me in the office after each ultrasound     2. Essential hypertension, benign Continue antihypertensive medications as already ordered, these medications have been reviewed and there are no changes at this time.   3. Uncontrolled type 2 diabetes mellitus with hyperglycemia, with long-term current use of insulin (HCC) Continue hypoglycemic medications as already ordered, these medications have been reviewed and there are no changes at this time.  Hgb A1C to be monitored as already arranged by primary service   4. Mixed hyperlipidemia Continue statin as ordered and reviewed, no changes at this time    Current Outpatient Medications on File Prior to Visit  Medication Sig Dispense Refill  . carvedilol (COREG) 25  MG tablet Take 25 mg by mouth every morning.    . dorzolamide-timolol (COSOPT) 22.3-6.8 MG/ML ophthalmic solution Administer 1 drop into the left eye Two (2) times a day.    . lidocaine-prilocaine (EMLA) cream Apply 1 application topically Every Tuesday,Thursday,and Saturday with dialysis.     Marland Kitchen omeprazole (PRILOSEC) 40 MG capsule Take 80 mg by mouth 2 (two) times a day.     . prednisoLONE acetate (PRED FORTE) 1 % ophthalmic suspension Place 1 drop into the left eye every 4 (four) hours.    . tamsulosin (FLOMAX) 0.4 MG CAPS capsule Take 0.4 mg by mouth daily after breakfast.     . apixaban (ELIQUIS) 5 MG TABS tablet Take 2 tablets (10 mg total) by mouth once for 1 dose. (Patient not taking: Reported on 02/19/2020) 1 tablet 0  . HYDROcodone-acetaminophen (NORCO) 5-325 MG tablet Take 1 tablet by mouth every 6 (six) hours as needed for moderate pain. (Patient not taking: No sig reported) 30 tablet 0  . lisinopril (PRINIVIL,ZESTRIL) 20 MG tablet Take 20 mg by mouth 2 (two) times daily.  (Patient not taking: Reported on 02/19/2020)  5   No current facility-administered medications on file prior to visit.    There are no Patient Instructions on file for this visit. No follow-ups on file.   Kris Hartmann, NP

## 2020-02-26 ENCOUNTER — Emergency Department (HOSPITAL_COMMUNITY): Payer: Medicare (Managed Care)

## 2020-02-26 ENCOUNTER — Other Ambulatory Visit: Payer: Self-pay

## 2020-02-26 ENCOUNTER — Inpatient Hospital Stay (HOSPITAL_COMMUNITY)
Admission: EM | Admit: 2020-02-26 | Discharge: 2020-02-28 | DRG: 246 | Disposition: A | Discharge status: Home / Self Care | Payer: Medicare (Managed Care) | Attending: Interventional Cardiology | Admitting: Interventional Cardiology

## 2020-02-26 ENCOUNTER — Inpatient Hospital Stay (HOSPITAL_COMMUNITY)
Admission: EM | Disposition: A | Discharge status: Home / Self Care | Payer: Self-pay | Source: Home / Self Care | Attending: Interventional Cardiology

## 2020-02-26 ENCOUNTER — Encounter (HOSPITAL_COMMUNITY): Payer: Self-pay

## 2020-02-26 DIAGNOSIS — Z89422 Acquired absence of other left toe(s): Secondary | ICD-10-CM

## 2020-02-26 DIAGNOSIS — Z7682 Awaiting organ transplant status: Secondary | ICD-10-CM

## 2020-02-26 DIAGNOSIS — I4891 Unspecified atrial fibrillation: Secondary | ICD-10-CM

## 2020-02-26 DIAGNOSIS — G8929 Other chronic pain: Secondary | ICD-10-CM | POA: Diagnosis present

## 2020-02-26 DIAGNOSIS — Z7901 Long term (current) use of anticoagulants: Secondary | ICD-10-CM | POA: Diagnosis not present

## 2020-02-26 DIAGNOSIS — I471 Supraventricular tachycardia, unspecified: Secondary | ICD-10-CM | POA: Diagnosis present

## 2020-02-26 DIAGNOSIS — Z888 Allergy status to other drugs, medicaments and biological substances status: Secondary | ICD-10-CM

## 2020-02-26 DIAGNOSIS — I2109 ST elevation (STEMI) myocardial infarction involving other coronary artery of anterior wall: Secondary | ICD-10-CM

## 2020-02-26 DIAGNOSIS — N400 Enlarged prostate without lower urinary tract symptoms: Secondary | ICD-10-CM | POA: Diagnosis present

## 2020-02-26 DIAGNOSIS — N2581 Secondary hyperparathyroidism of renal origin: Secondary | ICD-10-CM | POA: Diagnosis present

## 2020-02-26 DIAGNOSIS — Z20822 Contact with and (suspected) exposure to covid-19: Secondary | ICD-10-CM | POA: Diagnosis present

## 2020-02-26 DIAGNOSIS — N186 End stage renal disease: Secondary | ICD-10-CM | POA: Diagnosis present

## 2020-02-26 DIAGNOSIS — I2510 Atherosclerotic heart disease of native coronary artery without angina pectoris: Secondary | ICD-10-CM | POA: Diagnosis present

## 2020-02-26 DIAGNOSIS — D631 Anemia in chronic kidney disease: Secondary | ICD-10-CM | POA: Diagnosis present

## 2020-02-26 DIAGNOSIS — Z79899 Other long term (current) drug therapy: Secondary | ICD-10-CM | POA: Diagnosis not present

## 2020-02-26 DIAGNOSIS — F419 Anxiety disorder, unspecified: Secondary | ICD-10-CM | POA: Diagnosis present

## 2020-02-26 DIAGNOSIS — I953 Hypotension of hemodialysis: Secondary | ICD-10-CM | POA: Diagnosis present

## 2020-02-26 DIAGNOSIS — K219 Gastro-esophageal reflux disease without esophagitis: Secondary | ICD-10-CM | POA: Diagnosis present

## 2020-02-26 DIAGNOSIS — I2102 ST elevation (STEMI) myocardial infarction involving left anterior descending coronary artery: Secondary | ICD-10-CM

## 2020-02-26 DIAGNOSIS — Z9102 Food additives allergy status: Secondary | ICD-10-CM | POA: Diagnosis not present

## 2020-02-26 DIAGNOSIS — R296 Repeated falls: Secondary | ICD-10-CM | POA: Diagnosis present

## 2020-02-26 DIAGNOSIS — Z91018 Allergy to other foods: Secondary | ICD-10-CM | POA: Diagnosis not present

## 2020-02-26 DIAGNOSIS — Z992 Dependence on renal dialysis: Secondary | ICD-10-CM | POA: Diagnosis not present

## 2020-02-26 DIAGNOSIS — I132 Hypertensive heart and chronic kidney disease with heart failure and with stage 5 chronic kidney disease, or end stage renal disease: Secondary | ICD-10-CM | POA: Diagnosis present

## 2020-02-26 DIAGNOSIS — Z881 Allergy status to other antibiotic agents status: Secondary | ICD-10-CM | POA: Diagnosis not present

## 2020-02-26 DIAGNOSIS — E782 Mixed hyperlipidemia: Secondary | ICD-10-CM | POA: Diagnosis present

## 2020-02-26 DIAGNOSIS — I480 Paroxysmal atrial fibrillation: Secondary | ICD-10-CM | POA: Diagnosis present

## 2020-02-26 DIAGNOSIS — I509 Heart failure, unspecified: Secondary | ICD-10-CM

## 2020-02-26 DIAGNOSIS — I10 Essential (primary) hypertension: Secondary | ICD-10-CM | POA: Diagnosis present

## 2020-02-26 DIAGNOSIS — I5032 Chronic diastolic (congestive) heart failure: Secondary | ICD-10-CM | POA: Diagnosis present

## 2020-02-26 DIAGNOSIS — I213 ST elevation (STEMI) myocardial infarction of unspecified site: Secondary | ICD-10-CM

## 2020-02-26 DIAGNOSIS — Z955 Presence of coronary angioplasty implant and graft: Secondary | ICD-10-CM

## 2020-02-26 DIAGNOSIS — Z8249 Family history of ischemic heart disease and other diseases of the circulatory system: Secondary | ICD-10-CM

## 2020-02-26 HISTORY — DX: ST elevation (STEMI) myocardial infarction involving other coronary artery of anterior wall: I21.09

## 2020-02-26 HISTORY — PX: INTRAVASCULAR ULTRASOUND/IVUS: CATH118244

## 2020-02-26 HISTORY — PX: CORONARY/GRAFT ACUTE MI REVASCULARIZATION: CATH118305

## 2020-02-26 HISTORY — DX: Atherosclerotic heart disease of native coronary artery without angina pectoris: I25.10

## 2020-02-26 HISTORY — PX: LEFT HEART CATH AND CORONARY ANGIOGRAPHY: CATH118249

## 2020-02-26 LAB — TROPONIN I (HIGH SENSITIVITY): Troponin I (High Sensitivity): 18 ng/L — ABNORMAL HIGH (ref <18)

## 2020-02-26 LAB — RESP PANEL BY RT-PCR (FLU A&B, COVID) ARPGX2
Influenza A by PCR: NEGATIVE
Influenza B by PCR: NEGATIVE
SARS Coronavirus 2 by RT PCR: NEGATIVE

## 2020-02-26 LAB — COMPREHENSIVE METABOLIC PANEL
ALT: 7 U/L (ref 0–44)
AST: 8 U/L — ABNORMAL LOW (ref 15–41)
Albumin: 3.4 g/dL — ABNORMAL LOW (ref 3.5–5.0)
Alkaline Phosphatase: 70 U/L (ref 38–126)
Anion gap: 17 — ABNORMAL HIGH (ref 5–15)
BUN: 54 mg/dL — ABNORMAL HIGH (ref 6–20)
CO2: 18 mmol/L — ABNORMAL LOW (ref 22–32)
Calcium: 8.3 mg/dL — ABNORMAL LOW (ref 8.9–10.3)
Chloride: 104 mmol/L (ref 98–111)
Creatinine, Ser: 11.52 mg/dL — ABNORMAL HIGH (ref 0.61–1.24)
GFR, Estimated: 5 mL/min — ABNORMAL LOW (ref >60)
Glucose, Bld: 82 mg/dL (ref 70–99)
Potassium: 4.3 mmol/L (ref 3.5–5.1)
Sodium: 139 mmol/L (ref 135–145)
Total Bilirubin: 1.1 mg/dL (ref 0.3–1.2)
Total Protein: 6.4 g/dL — ABNORMAL LOW (ref 6.5–8.1)

## 2020-02-26 LAB — CBC WITH DIFFERENTIAL/PLATELET
Abs Immature Granulocytes: 0.03 10*3/uL (ref 0.00–0.07)
Basophils Absolute: 0 10*3/uL (ref 0.0–0.1)
Basophils Relative: 1 %
Eosinophils Absolute: 0.3 10*3/uL (ref 0.0–0.5)
Eosinophils Relative: 4 %
HCT: 35.3 % — ABNORMAL LOW (ref 39.0–52.0)
Hemoglobin: 10.9 g/dL — ABNORMAL LOW (ref 13.0–17.0)
Immature Granulocytes: 0 %
Lymphocytes Relative: 17 %
Lymphs Abs: 1.3 10*3/uL (ref 0.7–4.0)
MCH: 24.9 pg — ABNORMAL LOW (ref 26.0–34.0)
MCHC: 30.9 g/dL (ref 30.0–36.0)
MCV: 80.6 fL (ref 80.0–100.0)
Monocytes Absolute: 0.7 10*3/uL (ref 0.1–1.0)
Monocytes Relative: 9 %
Neutro Abs: 5.1 10*3/uL (ref 1.7–7.7)
Neutrophils Relative %: 69 %
Platelets: 197 10*3/uL (ref 150–400)
RBC: 4.38 MIL/uL (ref 4.22–5.81)
RDW: 15.9 % — ABNORMAL HIGH (ref 11.5–15.5)
WBC: 7.4 10*3/uL (ref 4.0–10.5)
nRBC: 0.7 % — ABNORMAL HIGH (ref 0.0–0.2)

## 2020-02-26 LAB — PROTIME-INR
INR: 1.1 (ref 0.8–1.2)
Prothrombin Time: 14 seconds (ref 11.4–15.2)

## 2020-02-26 LAB — LIPID PANEL
Cholesterol: 126 mg/dL (ref 0–200)
HDL: 39 mg/dL — ABNORMAL LOW (ref >40)
LDL Cholesterol: 61 mg/dL (ref 0–99)
Total CHOL/HDL Ratio: 3.2 RATIO
Triglycerides: 130 mg/dL (ref <150)
VLDL: 26 mg/dL (ref 0–40)

## 2020-02-26 LAB — HEMOGLOBIN A1C
Hgb A1c MFr Bld: 5.4 % (ref 4.8–5.6)
Mean Plasma Glucose: 108.28 mg/dL

## 2020-02-26 LAB — APTT: aPTT: 33 seconds (ref 24–36)

## 2020-02-26 SURGERY — LEFT HEART CATH AND CORONARY ANGIOGRAPHY
Anesthesia: LOCAL

## 2020-02-26 MED ORDER — PANTOPRAZOLE SODIUM 40 MG PO TBEC
40.0000 mg | DELAYED_RELEASE_TABLET | Freq: Every day | ORAL | Status: DC
  Administered 2020-02-27: 40 mg via ORAL
  Filled 2020-02-26: qty 1

## 2020-02-26 MED ORDER — IOHEXOL 350 MG/ML SOLN
INTRAVENOUS | Status: AC
  Filled 2020-02-26: qty 1

## 2020-02-26 MED ORDER — SODIUM CHLORIDE 0.9 % IV BOLUS
500.0000 mL | Freq: Once | INTRAVENOUS | Status: AC
  Administered 2020-02-26: 500 mL via INTRAVENOUS

## 2020-02-26 MED ORDER — HEPARIN (PORCINE) 25000 UT/250ML-% IV SOLN
INTRAVENOUS | Status: AC
  Filled 2020-02-26: qty 250

## 2020-02-26 MED ORDER — HYDROCODONE-ACETAMINOPHEN 5-325 MG PO TABS
1.0000 | ORAL_TABLET | Freq: Four times a day (QID) | ORAL | Status: DC | PRN
  Administered 2020-02-26: 1 via ORAL
  Filled 2020-02-26: qty 1

## 2020-02-26 MED ORDER — HYDRALAZINE HCL 20 MG/ML IJ SOLN
10.0000 mg | INTRAMUSCULAR | Status: AC | PRN
  Administered 2020-02-26: 10 mg via INTRAVENOUS
  Filled 2020-02-26: qty 1

## 2020-02-26 MED ORDER — HEPARIN (PORCINE) IN NACL 1000-0.9 UT/500ML-% IV SOLN
INTRAVENOUS | Status: AC
  Filled 2020-02-26: qty 1000

## 2020-02-26 MED ORDER — HEPARIN (PORCINE) 25000 UT/250ML-% IV SOLN
1150.0000 [IU]/h | INTRAVENOUS | Status: DC
  Administered 2020-02-26: 1150 [IU]/h via INTRAVENOUS
  Filled 2020-02-26 (×2): qty 250

## 2020-02-26 MED ORDER — HEPARIN SODIUM (PORCINE) 1000 UNIT/ML IJ SOLN
INTRAMUSCULAR | Status: AC
  Filled 2020-02-26: qty 1

## 2020-02-26 MED ORDER — NOREPINEPHRINE 4 MG/250ML-% IV SOLN: 0.0000 ug/min | INTRAVENOUS | Status: DC

## 2020-02-26 MED ORDER — PREDNISOLONE ACETATE 1 % OP SUSP
1.0000 [drp] | OPHTHALMIC | Status: DC
  Administered 2020-02-26 – 2020-02-28 (×8): 1 [drp] via OPHTHALMIC
  Filled 2020-02-26: qty 5

## 2020-02-26 MED ORDER — SODIUM CHLORIDE 0.9 % IV SOLN: 250.0000 mL | INTRAVENOUS | Status: DC | PRN

## 2020-02-26 MED ORDER — LIDOCAINE HCL (PF) 1 % IJ SOLN
INTRAMUSCULAR | Status: AC
  Filled 2020-02-26: qty 30

## 2020-02-26 MED ORDER — ASPIRIN 81 MG PO CHEW
324.0000 mg | CHEWABLE_TABLET | Freq: Once | ORAL | Status: AC
  Administered 2020-02-26: 324 mg via ORAL

## 2020-02-26 MED ORDER — ACETAMINOPHEN 325 MG PO TABS: 650.0000 mg | ORAL_TABLET | ORAL | Status: DC | PRN

## 2020-02-26 MED ORDER — CHLORHEXIDINE GLUCONATE CLOTH 2 % EX PADS
6.0000 | MEDICATED_PAD | Freq: Every day | CUTANEOUS | Status: DC
  Administered 2020-02-27: 6 via TOPICAL

## 2020-02-26 MED ORDER — LABETALOL HCL 5 MG/ML IV SOLN: 10.0000 mg | INTRAVENOUS | Status: AC | PRN

## 2020-02-26 MED ORDER — TAMSULOSIN HCL 0.4 MG PO CAPS
0.4000 mg | ORAL_CAPSULE | Freq: Every day | ORAL | Status: DC
  Administered 2020-02-27: 0.4 mg via ORAL
  Filled 2020-02-26: qty 1

## 2020-02-26 MED ORDER — AMIODARONE HCL IN DEXTROSE 360-4.14 MG/200ML-% IV SOLN
INTRAVENOUS | Status: AC
  Filled 2020-02-26: qty 200

## 2020-02-26 MED ORDER — ASPIRIN 81 MG PO CHEW
81.0000 mg | CHEWABLE_TABLET | Freq: Every day | ORAL | Status: DC
  Administered 2020-02-27: 81 mg via ORAL
  Filled 2020-02-26: qty 1

## 2020-02-26 MED ORDER — AMIODARONE HCL IN DEXTROSE 360-4.14 MG/200ML-% IV SOLN
60.0000 mg/h | INTRAVENOUS | Status: AC
  Administered 2020-02-26 (×2): 60 mg/h via INTRAVENOUS

## 2020-02-26 MED ORDER — LIDOCAINE-PRILOCAINE 2.5-2.5 % EX CREA: 1.0000 "application " | TOPICAL_CREAM | CUTANEOUS | Status: DC

## 2020-02-26 MED ORDER — TICAGRELOR 90 MG PO TABS
ORAL_TABLET | ORAL | Status: AC
  Filled 2020-02-26: qty 2

## 2020-02-26 MED ORDER — HEPARIN (PORCINE) IN NACL 1000-0.9 UT/500ML-% IV SOLN
INTRAVENOUS | Status: DC | PRN
  Administered 2020-02-26 (×2): 500 mL

## 2020-02-26 MED ORDER — DORZOLAMIDE HCL-TIMOLOL MAL 2-0.5 % OP SOLN
1.0000 [drp] | Freq: Two times a day (BID) | OPHTHALMIC | Status: DC
  Administered 2020-02-26 – 2020-02-27 (×4): 1 [drp] via OPHTHALMIC
  Filled 2020-02-26: qty 10

## 2020-02-26 MED ORDER — AMIODARONE HCL IN DEXTROSE 360-4.14 MG/200ML-% IV SOLN
INTRAVENOUS | Status: AC | PRN
  Administered 2020-02-26: 60 mg/h via INTRAVENOUS

## 2020-02-26 MED ORDER — AMIODARONE LOAD VIA INFUSION
INTRAVENOUS | Status: DC | PRN
  Administered 2020-02-26 (×2): 150 mg via INTRAVENOUS

## 2020-02-26 MED ORDER — MIDAZOLAM HCL 2 MG/2ML IJ SOLN
INTRAMUSCULAR | Status: AC
  Filled 2020-02-26: qty 2

## 2020-02-26 MED ORDER — NOREPINEPHRINE 4 MG/250ML-% IV SOLN
INTRAVENOUS | Status: AC
  Administered 2020-02-26: 2 ug/min via INTRAVENOUS
  Filled 2020-02-26: qty 250

## 2020-02-26 MED ORDER — TICAGRELOR 90 MG PO TABS
90.0000 mg | ORAL_TABLET | Freq: Two times a day (BID) | ORAL | Status: DC
  Administered 2020-02-26 – 2020-02-27 (×3): 90 mg via ORAL
  Filled 2020-02-26 (×3): qty 1

## 2020-02-26 MED ORDER — HEPARIN SODIUM (PORCINE) 1000 UNIT/ML IJ SOLN
INTRAMUSCULAR | Status: DC | PRN
  Administered 2020-02-26: 2000 [IU] via INTRAVENOUS
  Administered 2020-02-26: 12000 [IU] via INTRAVENOUS

## 2020-02-26 MED ORDER — ONDANSETRON HCL 4 MG/2ML IJ SOLN: 4.0000 mg | Freq: Four times a day (QID) | INTRAMUSCULAR | Status: DC | PRN

## 2020-02-26 MED ORDER — FENTANYL CITRATE (PF) 100 MCG/2ML IJ SOLN
INTRAMUSCULAR | Status: DC | PRN
  Administered 2020-02-26: 25 ug via INTRAVENOUS

## 2020-02-26 MED ORDER — HEPARIN BOLUS VIA INFUSION
4000.0000 [IU] | Freq: Once | INTRAVENOUS | Status: AC
  Administered 2020-02-26: 4000 [IU] via INTRAVENOUS

## 2020-02-26 MED ORDER — FENTANYL CITRATE (PF) 100 MCG/2ML IJ SOLN
INTRAMUSCULAR | Status: AC
  Filled 2020-02-26: qty 2

## 2020-02-26 MED ORDER — SODIUM CHLORIDE 0.9% FLUSH
3.0000 mL | Freq: Two times a day (BID) | INTRAVENOUS | Status: DC
  Administered 2020-02-26 – 2020-02-27 (×3): 3 mL via INTRAVENOUS

## 2020-02-26 MED ORDER — VERAPAMIL HCL 2.5 MG/ML IV SOLN
INTRAVENOUS | Status: AC
  Filled 2020-02-26: qty 2

## 2020-02-26 MED ORDER — LIDOCAINE HCL (PF) 1 % IJ SOLN
INTRAMUSCULAR | Status: DC | PRN
  Administered 2020-02-26: 15 mL via INTRADERMAL

## 2020-02-26 MED ORDER — MIDAZOLAM HCL 2 MG/2ML IJ SOLN
INTRAMUSCULAR | Status: DC | PRN
  Administered 2020-02-26: 1 mg via INTRAVENOUS

## 2020-02-26 MED ORDER — ATORVASTATIN CALCIUM 80 MG PO TABS
80.0000 mg | ORAL_TABLET | Freq: Every day | ORAL | Status: DC
  Administered 2020-02-26 – 2020-02-27 (×2): 80 mg via ORAL
  Filled 2020-02-26 (×2): qty 1

## 2020-02-26 MED ORDER — SODIUM CHLORIDE 0.9 % IV SOLN: INTRAVENOUS | Status: DC

## 2020-02-26 MED ORDER — CARVEDILOL 3.125 MG PO TABS
3.1250 mg | ORAL_TABLET | Freq: Two times a day (BID) | ORAL | Status: DC
  Administered 2020-02-26: 3.125 mg via ORAL
  Filled 2020-02-26: qty 1

## 2020-02-26 MED ORDER — SODIUM CHLORIDE 0.9% FLUSH: 3.0000 mL | INTRAVENOUS | Status: DC | PRN

## 2020-02-26 MED ORDER — TICAGRELOR 90 MG PO TABS
ORAL_TABLET | ORAL | Status: DC | PRN
  Administered 2020-02-26: 180 mg via ORAL

## 2020-02-26 MED ORDER — IOHEXOL 350 MG/ML SOLN
INTRAVENOUS | Status: DC | PRN
  Administered 2020-02-26: 130 mL via INTRA_ARTERIAL

## 2020-02-26 MED ORDER — AMIODARONE HCL IN DEXTROSE 360-4.14 MG/200ML-% IV SOLN
30.0000 mg/h | INTRAVENOUS | Status: DC
  Administered 2020-02-26 – 2020-02-27 (×2): 30 mg/h via INTRAVENOUS
  Filled 2020-02-26 (×2): qty 200

## 2020-02-26 SURGICAL SUPPLY — 16 items
BALLN SAPPHIRE ~~LOC~~ 4.0X18 (BALLOONS) ×2 IMPLANT
CATH INFINITI 5FR MULTPACK ANG (CATHETERS) ×2 IMPLANT
CATH LAUNCHER 5F EBU4.0 (CATHETERS) ×2 IMPLANT
CATH OPTICROSS HD (CATHETERS) ×2 IMPLANT
KIT ENCORE 26 ADVANTAGE (KITS) ×2 IMPLANT
KIT HEART LEFT (KITS) ×2 IMPLANT
KIT HEMO VALVE WATCHDOG (MISCELLANEOUS) ×2 IMPLANT
PACK CARDIAC CATHETERIZATION (CUSTOM PROCEDURE TRAY) ×2 IMPLANT
SHEATH PINNACLE 6F 10CM (SHEATH) ×2 IMPLANT
SLED PULL BACK IVUS (MISCELLANEOUS) ×2 IMPLANT
STENT RESOLUTE ONYX 3.5X30 (Permanent Stent) ×2 IMPLANT
SYR MEDRAD MARK 7 150ML (SYRINGE) ×2 IMPLANT
TRANSDUCER W/STOPCOCK (MISCELLANEOUS) ×2 IMPLANT
TUBING CIL FLEX 10 FLL-RA (TUBING) ×2 IMPLANT
WIRE ASAHI PROWATER 180CM (WIRE) ×2 IMPLANT
WIRE EMERALD 3MM-J .035X150CM (WIRE) ×2 IMPLANT

## 2020-02-26 NOTE — ED Notes (Signed)
[facility] at bedside for transport, B. Jearld Adjutant RN given report.

## 2020-02-26 NOTE — H&P (Addendum)
Cardiology Admission History and Physical:   Patient ID: [name]. MRN: [id number]; DOB: [birth year]   Admission date: 02/26/2020  Primary Care Provider: [name], MD [facility] Cardiologist: New  Chief Complaint:  STEMI  Patient Profile:   [name]. is a 50 y.o. male with hx of uncontrolled DM, ESRD on HD (waiting organ transplant), HTN, HLD, gastroparesis, and diastolic dysfunction transferred from [facility] with code STEMI.   Echo 06/2017 with LVEF of 75%, grade 1 DD and mild MR.   Seen at [facility] Transplant Nephrology 02/08/2020>>>pending evaluation and waiting organ transplant  History of Present Illness:   Mr. Dewald had sudden episode of hypotension with SOB in 60s and tachycardia at 120s during dialysis today. Per reports, he had completed > 3 hours of dialysis. ? Missed tx of saturday. No improvement in BP despite 900cc of fluids at dialysis center. Initial SBP was in 120s. EMS was called in at 9:30am. He was given another 500 cc by EMS enroute. No chest pain but he was diaphoretic and dizzy on ER arrival at [facility]. EKG showed STE in anterior leads with reciprocal change in inferior lateral leads >> code STEMI activated. He has given ASA and heparin bolus and plan made to transfer to [facility].   Up on arrival to cath lab pt was asymptomatic. No chest pain, dyspnea or palpitations. SBP in 100s on Levophad on 4 mcg/minutes. Denies prior hx of CAD, MI or stroke. No regular exercise. No tobacco or illicit drug use.   Hgb 10.9 INR 1.1 CXR non conclusive K 4.3 Scr 11.15 Hs-troponin 18 LDL 61 Respiratory panel negative for influenza and COVID     Past Medical History:  Diagnosis Date  . Anxiety   . Cellulitis   . CHF (congestive heart failure) ([place])   . Chronic diarrhea   . Chronic kidney disease   . Chronic pain of both ankles   . Diabetes mellitus without complication ([place])   . Diabetes mellitus, type II ([place])   . Diabetic neuropathy ([place])   . Frequent  falls   . Gait instability   . GERD (gastroesophageal reflux disease)   . Heart palpitations   . History of kidney stones   . HLD (hyperlipidemia)   . Hypertension   . Insomnia   . Nausea and vomiting in adult    recurrent    Past Surgical History:  Procedure Laterality Date  . AMPUTATION Left 03/16/2016   Procedure: AMPUTATION DIGIT LEFT HALLUX;  Surgeon: Trula Slade, DPM;  Location: [place];  Service: Podiatry;  Laterality: Left;  can start around 5   . ARTHROSCOPIC REPAIR ACL Left   . AV FISTULA PLACEMENT Right 05/31/2019   Procedure: Brachiocephalic AV fistula creation;  Surgeon: Algernon Huxley, MD;  Location: [name] ORS;  Service: Vascular;  Laterality: Right;  . COLONOSCOPY WITH PROPOFOL N/A 10/28/2015   Procedure: COLONOSCOPY WITH PROPOFOL;  Surgeon: Lollie Sails, MD;  Location: [facility] ENDOSCOPY;  Service: Endoscopy;  Laterality: N/A;  . COLONOSCOPY WITH PROPOFOL N/A 10/29/2015   Procedure: COLONOSCOPY WITH PROPOFOL;  Surgeon: Lollie Sails, MD;  Location: [facility] ENDOSCOPY;  Service: Endoscopy;  Laterality: N/A;  . DIALYSIS/PERMA CATHETER INSERTION Right 04/26/2019   [facility]   . DIALYSIS/PERMA CATHETER INSERTION N/A 04/26/2019   Procedure: DIALYSIS/PERMA CATHETER INSERTION;  Surgeon: Algernon Huxley, MD;  Location: [place] CV LAB;  Service: Cardiovascular;  Laterality: N/A;  . DIALYSIS/PERMA CATHETER REMOVAL N/A 08/15/2019   Procedure: DIALYSIS/PERMA CATHETER REMOVAL;  Surgeon:  Schnier, Dolores Lory, MD;  Location: [place] CV LAB;  Service: Cardiovascular;  Laterality: N/A;  . ESOPHAGOGASTRODUODENOSCOPY (EGD) WITH PROPOFOL N/A 12/27/2017   Procedure: ESOPHAGOGASTRODUODENOSCOPY (EGD) WITH PROPOFOL;  Surgeon: Toledo, Benay Pike, MD;  Location: [name] ENDOSCOPY;  Service: Gastroenterology;  Laterality: N/A;  . PROSTATE SURGERY  2016  . TONSILECTOMY/ADENOIDECTOMY WITH MYRINGOTOMY    . TONSILLECTOMY       Medications Prior to Admission: Prior to Admission medications    Medication Sig Start Date End Date Taking? Authorizing Provider  apixaban (ELIQUIS) 5 MG TABS tablet Take 2 tablets (10 mg total) by mouth once for 1 dose. Patient not taking: Reported on 02/19/2020 08/27/19 08/27/19  Fredia Sorrow, MD  carvedilol (COREG) 25 MG tablet Take 25 mg by mouth every morning. 08/15/19   [provider]  dorzolamide-timolol (COSOPT) 22.3-6.8 MG/ML ophthalmic solution Administer 1 drop into the left eye Two (2) times a day. 11/28/19 11/27/20  [provider]  HYDROcodone-acetaminophen (NORCO) 5-325 MG tablet Take 1 tablet by mouth every 6 (six) hours as needed for moderate pain. Patient not taking: No sig reported 05/31/19   Algernon Huxley, MD  lidocaine-prilocaine (EMLA) cream Apply 1 application topically Every Tuesday,Thursday,and Saturday with dialysis.  08/15/19   [provider]  lisinopril (PRINIVIL,ZESTRIL) 20 MG tablet Take 20 mg by mouth 2 (two) times daily.  Patient not taking: Reported on 02/19/2020 10/04/17   [provider]  omeprazole (PRILOSEC) 40 MG capsule Take 80 mg by mouth 2 (two) times a day.  12/27/17   [provider]  prednisoLONE acetate (PRED FORTE) 1 % ophthalmic suspension Place 1 drop into the left eye every 4 (four) hours.    [provider]  tamsulosin (FLOMAX) 0.4 MG CAPS capsule Take 0.4 mg by mouth daily after breakfast.     [provider]     Allergies:    Allergies  Allergen Reactions  . Levofloxacin Swelling    Of face  . Promethazine Diarrhea and Other (See Comments)    Other reaction(s): Other (See Comments), Other (See Comments) Other reaction(s): Muscle cramps Muscle cramps Cramping Other reaction(s): Muscle cramps Muscle cramps  . Malt Other (See Comments)    Other reaction(s): cramping  . Soy Allergy Other (See Comments)    Other reaction(s): swelling of face....grew out of it.  No precautions now     Social History:   Social History   Socioeconomic History   . Marital status: Married    Spouse name: [name]   . Number of children: 2  . Years of education: Not on file  . Highest education level: High school graduate  Occupational History  . Occupation: Disability    Comment: not employed  Tobacco Use  . Smoking status: Never Smoker  . Smokeless tobacco: Never Used  Vaping Use  . Vaping Use: Never used  Substance and Sexual Activity  . Alcohol use: No  . Drug use: No  . Sexual activity: Yes  Other Topics Concern  . Not on file  Social History Narrative   Oldest son killed in car crash June 2020.    Social Determinants of Health   Financial Resource Strain: Not on file  Food Insecurity: Not on file  Transportation Needs: Not on file  Physical Activity: Not on file  Stress: Not on file  Social Connections: Not on file  Intimate Partner Violence: Not on file    Family History:  The patient's family history includes CAD in his father; Diabetes Mellitus  II in his mother; Kidney failure in his mother; Schizophrenia in his mother; Stroke in his father.    ROS:  Please see the history of present illness.  All other ROS reviewed and negative.     Physical Exam/Data:   Vitals:   02/26/20 1017 02/26/20 1021 02/26/20 1030 02/26/20 1045  BP:  (!) 76/49 (!) 84/58 (!) 74/48  Pulse:   (!) 119 (!) 35  Resp:  18 17 19   SpO2:   98% 92%  Weight: 97.4 kg     Height: 6\' 3"  (1.905 m)      No intake or output data in the 24 hours ending 02/26/20 1116 Last 3 Weights 02/26/2020 02/19/2020 09/07/2019  Weight (lbs) 214 lb 12.8 oz 212 lb 206 lb 12.8 oz  Weight (kg) 97.433 kg 96.163 kg 93.804 kg  Some encounter information is confidential and restricted. Go to Review Flowsheets activity to see all data.     Body mass index is 26.85 kg/m.  General:  Well nourished, well developed, in no acute distress HEENT: normal Lymph: no adenopathy Neck: no JVD Endocrine:  No thryomegaly Vascular: No carotid bruits; FA pulses 2+ bilaterally without  bruits  Cardiac:  normal S1, S2; Irregular tachycardic ; no murmur  Lungs:  clear to auscultation bilaterally, no wheezing, rhonchi or rales  Abd: soft, nontender, no hepatomegaly  Ext: no edema Musculoskeletal:  No deformities, BUE and BLE strength normal and equal Skin: warm and dry  Neuro:  CNs 2-12 intact, no focal abnormalities noted Psych:  Normal affect    EKG:  The ECG that was done today was personally reviewed and demonstrates atrial fibrillation, HR 124, Anterior STE with reciprocal ST depression in inferior lateral leads   Relevant CV Studies:  Echo 06/2017 Study Conclusions   - Left ventricle: The cavity size was normal. There was severe  concentric hypertrophy. Systolic function was vigorous. The  estimated ejection fraction was 75%. Wall motion was normal;  there were no regional wall motion abnormalities. Doppler  parameters are consistent with abnormal left ventricular  relaxation (grade 1 diastolic dysfunction).  - Aortic valve: Valve area (Vmax): 3.6 cm^2.  - Mitral valve: There was mild regurgitation.   Impressions:   - Normal LVEF and wall motion with mild diastolic dysfunction and  severe concentric LVH.   Laboratory Data:  High Sensitivity Troponin:   Recent Labs  Lab 02/26/20 1032  TROPONINIHS 18*      Chemistry Recent Labs  Lab 02/26/20 1032  NA 139  K 4.3  CL 104  CO2 18*  GLUCOSE 82  BUN 54*  CREATININE 11.52*  CALCIUM 8.3*  GFRNONAA 5*  ANIONGAP 17*    Recent Labs  Lab 02/26/20 1032  PROT 6.4*  ALBUMIN 3.4*  AST 8*  ALT 7  ALKPHOS 70  BILITOT 1.1   Hematology Recent Labs  Lab 02/26/20 1032  WBC 7.4  RBC 4.38  HGB 10.9*  HCT 35.3*  MCV 80.6  MCH 24.9*  MCHC 30.9  RDW 15.9*  PLT 197   Radiology/Studies:  DG Chest Port 1 View  Result Date: 02/26/2020 CLINICAL DATA:  Code STEMI. EXAM: PORTABLE CHEST 1 VIEW COMPARISON:  06/04/2019 FINDINGS: Cardiac enlargement with normal pulmonary vascularity. Negative  for heart failure, edema, fusion. Mild right lower lobe atelectasis. Central line is been removed since the prior study. IMPRESSION: Mild right lower lobe atelectasis. Left lung clear. Negative for congestive heart failure. Electronically Signed   By: Franchot Gallo M.D.   On:  02/26/2020 10:56     Assessment and Plan:   1. STEMI - No chest pain. Taken to [facility] ER for episode of hypotension and tachycardia during dialysis. EKG with Anterior STE with reciprocal ST depression in inferior lateral leads. Given ASA and heparin. Transferred to cath lab for emergent cath.  BP established on levophed. Pending cath.   2. New onset atrial fibrillation  - HR in 150s at cath lab.  - Will need anticoagulation   3. ESRD on HD ([facility]) - Completed > 3 hours of dialysis per EMS report - Undergoing transplant work up at [facility]  4. DM - SSI  While here - Pending HgbA1c  TIMI Risk Score for ST  Elevation MI:   The patient's TIMI risk score is 7, which indicates a 23.4% risk of all cause mortality at 30 days.   CHA2DS2-VASc Score = 3  This indicates a 3.2% annual risk of stroke. The patient's score is based upon: CHF History: Yes HTN History: Yes Diabetes History: Yes Stroke History: No Vascular Disease History: No Age Score: 0 Gender Score: 0   Score is 4 if CAD  Severity of Illness: The appropriate patient status for this patient is INPATIENT. Inpatient status is judged to be reasonable and necessary in order to provide the required intensity of service to ensure the patient's safety. The patient's presenting symptoms, physical exam findings, and initial radiographic and laboratory data in the context of their chronic comorbidities is felt to place them at high risk for further clinical deterioration. Furthermore, it is not anticipated that the patient will be medically stable for discharge from the hospital within 2 midnights of admission. The following factors support the patient status of  inpatient.   " The patient's presenting symptoms include Diaphoresis. " The worrisome physical exam findings include none " The initial radiographic and laboratory data are worrisome because of STEMI " The chronic co-morbidities include ESRD, DM, HTN   * I certify that at the point of admission it is my clinical judgment that the patient will require inpatient hospital care spanning beyond 2 midnights from the point of admission due to high intensity of service, high risk for further deterioration and high frequency of surveillance required.*    For questions or updates, please contact [place] Please consult [email or web address] for contact info under     Signed, Leanor Kail, PA  02/26/2020 11:16 AM   I have examined the patient and reviewed assessment and plan and discussed with patient.  Agree with above as stated.    I personally reviewed the ECG and made the decision for emergency cath which showed:  "The left ventricular systolic function is normal.  LV end diastolic pressure is normal.  The left ventricular ejection fraction is greater than 65% by visual estimate.  There is no aortic valve stenosis.  Mid LAD lesion is 90% stenosed.  A drug-eluting stent was successfully placed using a STENT RESOLUTE ONYX 3.5X30, postdilated to > 4 mm. Stent optimized with IVUS.  Post intervention, there is a 0% residual stenosis.   I suspect his abnormal ECG and anterior injury pattern was more from demand ischemia in the setting of hypotension and AFib with RVR, with severe mid LAD lesion."  During the cath, he converted to NSR briefly and his pulse pressure and BP returned to normal without any pressors. In addition, LVEDP only 11 mm Hg despite having 2.4 L NS administered on transport.  He may have been a little dehydrated after 3 L  fluid removal after dialysis.   In regards to AFib.  Continue IV Amiodarone.  Will have to decide if he needs longterm anticoagulation along with  antiplatelet therapy.  If so, would change Brilinta to clopidogrel.  Would have to check what options work in the setting of ESRD.  Loss of atrial kick seems to affect his BP significantly.  I think it will be important for him to maintain NSR.  Dehydraton may trigger his AFib, so his dry weight at dialysis will have to be adjusted.   Holding lisinopril and beta blocker due to borderline BP at this time while in AFib.    Larae Grooms

## 2020-02-26 NOTE — ED Triage Notes (Signed)
Pt here via [facility] from dialysis, 3x a week, initial systolic 931 at the beginning of tx and dropped into the 60s per facility. Was given 952mL of fluid at facility. 121 systolic & HR 624E with EMS. Pt drowsy but able to answer questions appropriately.

## 2020-02-26 NOTE — ED Provider Notes (Signed)
[facility] EMERGENCY DEPARTMENT Provider Note   CSN: [id number] Arrival date & time: 02/26/20  1006     History Chief Complaint  Patient presents with  . Hypotension    [name]. is a 50 y.o. male with a past medical history of CHF, diabetes, hypertension,  end-stage renal disease on hemodialysis Tuesday Thursday and Saturday who presents to the emergency department for evaluation of hypotension.  The patient was at hemodialysis today.  Report from EMS is that the dialysis center attempted to take off 3 kg of fluid.  Apparently the patient then suddenly became hypotensive, tachycardic.  His initial blood pressure dropped down into the 60s.  Given back 900 of fluid by the dialysis center and another 500 by EMS with improvement in his blood pressure.  He denies any active chest pain but was soaked with sweat and diaphoretic upon arrival.  Patient is lethargic and answering with single word answers.  HPI     Past Medical History:  Diagnosis Date  . Anxiety   . Cellulitis   . CHF (congestive heart failure) ([place])   . Chronic diarrhea   . Chronic kidney disease   . Chronic pain of both ankles   . Diabetes mellitus without complication ([place])   . Diabetes mellitus, type II ([place])   . Diabetic neuropathy ([place])   . Frequent falls   . Gait instability   . GERD (gastroesophageal reflux disease)   . Heart palpitations   . History of kidney stones   . HLD (hyperlipidemia)   . Hypertension   . Insomnia   . Nausea and vomiting in adult    recurrent    Patient Active Problem List   Diagnosis Date Noted  . BPH (benign prostatic hyperplasia) 11/07/2019  . Fluid overload, unspecified 08/15/2019  . Dependence on renal dialysis ([place]) 07/20/2019  . Hypertension secondary to other renal disorders 07/20/2019  . NVG (neovascular glaucoma), left, 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  . Symptomatic anemia 06/05/2019  . Moderate protein-calorie malnutrition ([place]) 05/14/2019  . Anaphylactic  reaction due to adverse effect of correct drug or medicament properly administered, initial encounter 05/08/2019  . Anemia in chronic kidney disease 05/07/2019  . Chest pain, unspecified 05/07/2019  . Coagulation defect, unspecified ([place]) 05/07/2019  . Disorder of phosphorus metabolism, unspecified 05/07/2019  . Encounter for fitting and adjustment of extracorporeal dialysis catheter ([place]) 05/07/2019  . Gastroparesis 05/07/2019  . Iron deficiency anemia, unspecified 05/07/2019  . Major depressive disorder, recurrent, unspecified ([place]) 05/07/2019  . Pain, unspecified 05/07/2019  . Pruritus, unspecified 05/07/2019  . Dizziness 04/06/2019  . Metabolic acidosis 60/63/0160  . Syncope 04/06/2019  . Urinary retention 04/06/2019  . 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 Edema 11/28/2018  . Hypokalemia 11/28/2018  . Hypo-osmolality and hyponatremia 11/28/2018  . Proteinuria 11/28/2018  . Secondary hyperparathyroidism of renal origin ([place]) 11/28/2018  . Lymphedema 01/09/2018  . Chronic diastolic heart failure ([place]) 12/13/2017  . Vomiting 11/28/2017  . Acute on chronic heart failure ([place]) 11/23/2017  . Type 2 diabetes mellitus with stage 4 chronic kidney disease, with long-term current use of insulin ([place]) 11/23/2017  . Diabetic retinopathy ([place]) 07/05/2017  . Uncontrolled diabetes mellitus ([place]) 07/08/2016  . Uncontrolled type 2 diabetes mellitus with hyperglycemia, with long-term current use of insulin ([place]) 04/20/2016  . Osteomyelitis of toe of left foot ([place]) 03/15/2016  . Acute on chronic renal failure ([name]) 06/23/2015  . Diarrhea  06/23/2015  . Mixed hyperlipidemia   . Essential hypertension, benign   . Heart palpitations 05/06/2015    Past Surgical History:  Procedure Laterality Date  . AMPUTATION Left 03/16/2016   Procedure: AMPUTATION DIGIT LEFT HALLUX;  Surgeon: Trula Slade, DPM;  Location: [facility];  Service: Podiatry;  Laterality: Left;  can start around 5   . ARTHROSCOPIC REPAIR ACL Left   . AV FISTULA PLACEMENT Right 05/31/2019   Procedure: Brachiocephalic AV fistula creation;  Surgeon: Algernon Huxley, MD;  Location: [name] ORS;  Service: Vascular;  Laterality: Right;  . COLONOSCOPY WITH PROPOFOL N/A 10/28/2015   Procedure: COLONOSCOPY WITH PROPOFOL;  Surgeon: Lollie Sails, MD;  Location: [facility] ENDOSCOPY;  Service: Endoscopy;  Laterality: N/A;  . COLONOSCOPY WITH PROPOFOL N/A 10/29/2015   Procedure: COLONOSCOPY WITH PROPOFOL;  Surgeon: Lollie Sails, MD;  Location: [facility] ENDOSCOPY;  Service: Endoscopy;  Laterality: N/A;  . DIALYSIS/PERMA CATHETER INSERTION Right 04/26/2019   [facility]   . DIALYSIS/PERMA CATHETER INSERTION N/A 04/26/2019   Procedure: DIALYSIS/PERMA CATHETER INSERTION;  Surgeon: Algernon Huxley, MD;  Location: [facility] CV LAB;  Service: Cardiovascular;  Laterality: N/A;  . DIALYSIS/PERMA CATHETER REMOVAL N/A 08/15/2019   Procedure: DIALYSIS/PERMA CATHETER REMOVAL;  Surgeon: Katha Cabal, MD;  Location: [facility];  Service: Cardiovascular;  Laterality: N/A;  . ESOPHAGOGASTRODUODENOSCOPY (EGD) WITH PROPOFOL N/A 12/27/2017   Procedure: ESOPHAGOGASTRODUODENOSCOPY (EGD) WITH PROPOFOL;  Surgeon: Toledo, Benay Pike, MD;  Location: [name] ENDOSCOPY;  Service: Gastroenterology;  Laterality: N/A;  . PROSTATE SURGERY  2016  . TONSILECTOMY/ADENOIDECTOMY WITH MYRINGOTOMY    . TONSILLECTOMY         Family History  Problem Relation Age of Onset  . CAD Father   . Stroke Father   . Diabetes Mellitus II Mother   . Kidney failure Mother   . Schizophrenia Mother     Social History   Tobacco Use  . Smoking status: Never Smoker  . Smokeless tobacco: Never Used  Vaping Use  . Vaping Use: Never used  Substance Use Topics  . Alcohol use: No  . Drug use: No    Home Medications Prior to Admission medications   Medication Sig Start  Date End Date Taking? Authorizing Provider  apixaban (ELIQUIS) 5 MG TABS tablet Take 2 tablets (10 mg total) by mouth once for 1 dose. Patient not taking: Reported on 02/19/2020 08/27/19 08/27/19  Fredia Sorrow, MD  carvedilol (COREG) 25 MG tablet Take 25 mg by mouth every morning. 08/15/19   [provider]  dorzolamide-timolol (COSOPT) 22.3-6.8 MG/ML ophthalmic solution Administer 1 drop into the left eye Two (2) times a day. 11/28/19 11/27/20  [provider]  HYDROcodone-acetaminophen (NORCO) 5-325 MG tablet Take 1 tablet by mouth every 6 (six) hours as needed for moderate pain. Patient not taking: No sig reported 05/31/19   Algernon Huxley, MD  lidocaine-prilocaine (EMLA) cream Apply 1 application topically Every Tuesday,Thursday,and Saturday with dialysis.  08/15/19   [provider]  lisinopril (PRINIVIL,ZESTRIL) 20 MG tablet Take 20 mg by mouth 2 (two) times daily.  Patient not taking: Reported on 02/19/2020 10/04/17   [provider]  omeprazole (PRILOSEC) 40 MG capsule Take 80 mg by mouth 2 (two) times a day.  12/27/17   [provider]  prednisoLONE acetate (PRED FORTE) 1 % ophthalmic suspension Place 1 drop into the left eye every 4 (four) hours.    [provider]  tamsulosin (FLOMAX) 0.4 MG CAPS capsule Take  0.4 mg by mouth daily after breakfast.     [provider]    Allergies    Levofloxacin, Promethazine, Malt, and Soy allergy  Review of Systems   Review of Systems Ten systems reviewed and are negative for acute change, except as noted in the HPI.   Physical Exam Updated Vital Signs BP (!) 76/49   Pulse (!) 111   Resp 18   Ht 6\' 3"  (1.905 m)   Wt 97.4 kg   SpO2 98%   BMI 26.85 kg/m   Physical Exam Vitals and nursing note reviewed.  Constitutional:      General: He is not in acute distress.    Appearance: He is well-developed and well-nourished. He is ill-appearing and diaphoretic.  HENT:     Head:  Normocephalic and atraumatic.  Eyes:     General: No scleral icterus.    Conjunctiva/sclera: Conjunctivae normal.  Cardiovascular:     Rate and Rhythm: Normal rate and regular rhythm.     Heart sounds: Normal heart sounds.     Comments: RUE vascular dialysis access with palpable thrill, bandaged Pulmonary:     Effort: Pulmonary effort is normal. No respiratory distress.     Breath sounds: Normal breath sounds.  Abdominal:     Palpations: Abdomen is soft.     Tenderness: There is no abdominal tenderness.  Musculoskeletal:        General: No edema.     Cervical back: Normal range of motion and neck supple.  Skin:    General: Skin is warm.  Neurological:     Mental Status: He is lethargic.  Psychiatric:        Behavior: Behavior normal.     ED Results / Procedures / Treatments   Labs (all labs ordered are listed, but only abnormal results are displayed) Labs Reviewed  RESP PANEL BY RT-PCR (FLU A&B, COVID) ARPGX2  HEMOGLOBIN A1C  CBC WITH DIFFERENTIAL/PLATELET  PROTIME-INR  APTT  COMPREHENSIVE METABOLIC PANEL  LIPID PANEL  TROPONIN I (HIGH SENSITIVITY)    EKG EKG Interpretation  Date/Time:  Tuesday February 26 2020 10:15:38 EST Ventricular Rate:  124 PR Interval:    QRS Duration: 98 QT Interval:  324 QTC Calculation: 466 R Axis:   85 Text Interpretation: Atrial fibrillation Probable LVH with secondary repol abnrm Anterior Q waves, possibly due to LVH ST depression, consider ischemia, diffuse lds Abnormal T, probable ischemia, lateral leads Since last tracing afib is new STEMI criteria present Confirmed by Calvert Cantor [phone]) on 02/26/2020 10:33:35 AM   Radiology No results found.  Procedures .Critical Care Performed by: Margarita Mail, PA-C Authorized by: Margarita Mail, PA-C   Critical care provider statement:    Critical care time (minutes):  35   Critical care time was exclusive of:  Separately billable procedures and treating other patients    Critical care was necessary to treat or prevent imminent or life-threatening deterioration of the following conditions:  Cardiac failure   Critical care was time spent personally by me on the following activities:  Discussions with consultants, evaluation of patient's response to treatment, examination of patient, ordering and performing treatments and interventions, ordering and review of laboratory studies, ordering and review of radiographic studies, pulse oximetry, re-evaluation of patient's condition, obtaining history from patient or surrogate and review of old charts   (including critical care time)  Medications Ordered in ED Medications  0.9 %  sodium chloride infusion (has no administration in time range)  heparin bolus via infusion 4,000  Units (has no administration in time range)  heparin 25000 UT/250ML infusion (has no administration in time range)  aspirin chewable tablet 324 mg (324 mg Oral Given 02/26/20 1024)  sodium chloride 0.9 % bolus 500 mL (500 mLs Intravenous New Bag/Given 02/26/20 1032)    ED Course  I have reviewed the triage vital signs and the nursing notes.  Pertinent labs & imaging results that were available during my care of the patient were reviewed by me and considered in my medical decision making (see chart for details).    MDM Rules/Calculators/A&P                          Patient seen in shared visit with Dr. Mali Sheldon. CODE STEMI initiated immediately upon evaluation of EKG which shows new onset AFIB at a rate of 124 (patient on eliquis) and Deep ST seg depression in V3-5 with reciprocal change in AVR consistent with STEMI. Case discussed with Dr. Irish Lack who agrees  Patient BP again decreased  Given fluids   Heparin started   Patient unresponsive to fluids despite 2400 ml of fluid resuscitation.  Patient started on Levophed drip.  He was transferred emergently to [facility] as a code STEMI  Final Clinical Impression(s) / ED Diagnoses Final diagnoses:   None    Rx / DC Orders ED Discharge Orders    None       Margarita Mail, PA-C 02/26/20 1643    Truddie Hidden, MD 02/27/20 [phone]

## 2020-02-26 NOTE — Progress Notes (Signed)
ANTICOAGULATION CONSULT NOTE - Initial Consult  Pharmacy Consult for STEMI Indication: chest pain/ACS  Allergies  Allergen Reactions  . Levofloxacin Swelling    Of face  . Promethazine Diarrhea and Other (See Comments)    Other reaction(s): Other (See Comments), Other (See Comments) Other reaction(s): Muscle cramps Muscle cramps Cramping Other reaction(s): Muscle cramps Muscle cramps  . Malt Other (See Comments)    Other reaction(s): cramping  . Soy Allergy Other (See Comments)    Other reaction(s): swelling of face....grew out of it.  No precautions now     Patient Measurements: Height: 6\' 3"  (190.5 cm) Weight: 97.4 kg (214 lb 12.8 oz) IBW/kg (Calculated) : 84.5 Heparin Dosing Weight: 97.4kg  Vital Signs: BP: 93/57 (01/04 1234) Pulse Rate: 101 (01/04 1234)  Labs: Recent Labs    02/26/20 1032  HGB 10.9*  HCT 35.3*  PLT 197  APTT 33  LABPROT 14.0  INR 1.1  CREATININE 11.52*  TROPONINIHS 18*    Estimated Creatinine Clearance: 9 mL/min (A) (by C-G formula based on SCr of 11.52 mg/dL (H)).   Medical History: Past Medical History:  Diagnosis Date  . Anxiety   . Cellulitis   . CHF (congestive heart failure) ([place])   . Chronic diarrhea   . Chronic kidney disease   . Chronic pain of both ankles   . Diabetes mellitus without complication ([place])   . Diabetes mellitus, type II ([place])   . Diabetic neuropathy ([place])   . Frequent falls   . Gait instability   . GERD (gastroesophageal reflux disease)   . Heart palpitations   . History of kidney stones   . HLD (hyperlipidemia)   . Hypertension   . Insomnia   . Nausea and vomiting in adult    recurrent    Medications:  Scheduled:  . [START ON 02/27/2020] aspirin  81 mg Oral Daily  . dorzolamide-timolol  1 drop Left Eye BID  . [START ON 02/27/2020] pantoprazole  40 mg Oral Daily  . prednisoLONE acetate  1 drop Left Eye Q4H  . sodium chloride flush  3 mL Intravenous Q12H  . [START ON 02/27/2020] tamsulosin  0.4 mg  Oral QPC breakfast  . ticagrelor  90 mg Oral BID    Assessment: 50 YO M with hx of ESRD on HD (waiting organ transplant) not on [facility] PTA, presents to ED with STEMI. Cath lab showed mid LAD 90% occlusion now s/p DES x1. In Afib converted to NSR in cath lab.  Consult to start heparin 8hrs post sheath removal. No AC PTA   Goal of Therapy:  Heparin level 0.3-0.7 units/ml Monitor CBC,HL and bleeding    Plan:  Initiate Heparin 1150 units/hr @ 2300 order HL levels 8hrs after start  Monitor CBC,HL and bleeding   Theodoro Parma, PharmD Student  02/26/2020,1:48 PM

## 2020-02-26 NOTE — Plan of Care (Signed)
  Problem: Education: Goal: Knowledge of General Education information will improve Description: Including pain rating scale, medication(s)/side effects and non-pharmacologic comfort measures Outcome: Progressing   Problem: Health Behavior/Discharge Planning: Goal: Ability to manage health-related needs will improve Outcome: Progressing   Problem: Clinical Measurements: Goal: Ability to maintain clinical measurements within normal limits will improve Outcome: Progressing Goal: Will remain free from infection Outcome: Progressing Goal: Diagnostic test results will improve Outcome: Progressing Goal: Respiratory complications will improve Outcome: Progressing Goal: Cardiovascular complication will be avoided Outcome: Progressing   Problem: Activity: Goal: Risk for activity intolerance will decrease Outcome: Progressing   Problem: Nutrition: Goal: Adequate nutrition will be maintained Outcome: Progressing   Problem: Coping: Goal: Level of anxiety will decrease Outcome: Progressing   Problem: Elimination: Goal: Will not experience complications related to bowel motility Outcome: Progressing Goal: Will not experience complications related to urinary retention Outcome: Progressing   Problem: Pain Managment: Goal: General experience of comfort will improve Outcome: Progressing   Problem: Safety: Goal: Ability to remain free from injury will improve Outcome: Progressing   Problem: Skin Integrity: Goal: Risk for impaired skin integrity will decrease Outcome: Progressing   Problem: Education: Goal: Understanding of cardiac disease, CV risk reduction, and recovery process will improve Outcome: Progressing Goal: Understanding of medication regimen will improve Outcome: Progressing Goal: Individualized Educational Video(s) Outcome: Progressing   Problem: Activity: Goal: Ability to tolerate increased activity will improve Outcome: Progressing   Problem: Cardiac: Goal:  Ability to achieve and maintain adequate cardiopulmonary perfusion will improve Outcome: Progressing Goal: Vascular access site(s) Level 0-1 will be maintained Outcome: Progressing   Problem: Health Behavior/Discharge Planning: Goal: Ability to safely manage health-related needs after discharge will improve Outcome: Progressing   

## 2020-02-26 NOTE — ED Notes (Signed)
ED Provider at bedside, CODE STEMI initiated.

## 2020-02-26 NOTE — ED Provider Notes (Signed)
  Patient seen and examined. Agree with assessment and plan by APP. Patient with hypotension during dialysis, did not improve with fluids there. He was diaphoretic and dizzy but denied any chest pain. Initial BP here improved, but dropped again. Initial EKG concerning for STEMI. Discussed with Dr. Irish Lack, Cardiologist who agrees with plan to transfer there as a Code STEMI. ASA given,Heparin bolus ordered. Dr. Beau Fanny recommends IVF and if not improved will need Levophed. [place] will be contacted to transport patient. Patient understands plan.   Physical Exam Vitals and nursing note reviewed.  Constitutional:      Appearance: He is well-developed and well-nourished.  HENT:     Head: Normocephalic and atraumatic.  Eyes:     Extraocular Movements: EOM normal.     Pupils: Pupils are equal, round, and reactive to light.  Cardiovascular:     Rate and Rhythm: Normal rate.     Pulses: Intact distal pulses.     Heart sounds: Normal heart sounds.  Pulmonary:     Effort: Pulmonary effort is normal.     Breath sounds: Normal breath sounds.  Abdominal:     General: Bowel sounds are normal. There is no distension.     Tenderness: There is no abdominal tenderness.  Musculoskeletal:        General: No tenderness or edema. Normal range of motion.     Cervical back: Normal range of motion and neck supple.  Skin:    General: Skin is warm and dry.     Findings: No rash.  Neurological:     Mental Status: He is alert and oriented to person, place, and time.     Cranial Nerves: No cranial nerve deficit.     Sensory: No sensory deficit.     Deep Tendon Reflexes: Strength normal.  Psychiatric:        Mood and Affect: Mood and affect normal.    .Critical Care Performed by: Truddie Hidden, MD Authorized by: Truddie Hidden, MD   Critical care provider statement:    Critical care time (minutes):  30   Critical care time was exclusive of:  Separately billable procedures and treating  other patients   Critical care was necessary to treat or prevent imminent or life-threatening deterioration of the following conditions:  Cardiac failure and renal failure   Critical care was time spent personally by me on the following activities:  Discussions with consultants, evaluation of patient's response to treatment, examination of patient, ordering and performing treatments and interventions, ordering and review of laboratory studies, ordering and review of radiographic studies, pulse oximetry, re-evaluation of patient's condition, obtaining history from patient or surrogate and review of old charts     Truddie Hidden, MD 02/26/20 1517

## 2020-02-27 DIAGNOSIS — I480 Paroxysmal atrial fibrillation: Secondary | ICD-10-CM | POA: Diagnosis not present

## 2020-02-27 DIAGNOSIS — I2109 ST elevation (STEMI) myocardial infarction involving other coronary artery of anterior wall: Principal | ICD-10-CM

## 2020-02-27 LAB — POCT ACTIVATED CLOTTING TIME: Activated Clotting Time: 315 seconds

## 2020-02-27 LAB — RENAL FUNCTION PANEL
Albumin: 3.2 g/dL — ABNORMAL LOW (ref 3.5–5.0)
Anion gap: 15 (ref 5–15)
BUN: 66 mg/dL — ABNORMAL HIGH (ref 6–20)
CO2: 18 mmol/L — ABNORMAL LOW (ref 22–32)
Calcium: 8.1 mg/dL — ABNORMAL LOW (ref 8.9–10.3)
Chloride: 103 mmol/L (ref 98–111)
Creatinine, Ser: 14.71 mg/dL — ABNORMAL HIGH (ref 0.61–1.24)
GFR, Estimated: 4 mL/min — ABNORMAL LOW (ref >60)
Glucose, Bld: 101 mg/dL — ABNORMAL HIGH (ref 70–99)
Phosphorus: 8.7 mg/dL — ABNORMAL HIGH (ref 2.5–4.6)
Potassium: 4.9 mmol/L (ref 3.5–5.1)
Sodium: 136 mmol/L (ref 135–145)

## 2020-02-27 LAB — BASIC METABOLIC PANEL
Anion gap: 16 — ABNORMAL HIGH (ref 5–15)
BUN: 63 mg/dL — ABNORMAL HIGH (ref 6–20)
CO2: 17 mmol/L — ABNORMAL LOW (ref 22–32)
Calcium: 8.1 mg/dL — ABNORMAL LOW (ref 8.9–10.3)
Chloride: 103 mmol/L (ref 98–111)
Creatinine, Ser: 14.23 mg/dL — ABNORMAL HIGH (ref 0.61–1.24)
GFR, Estimated: 4 mL/min — ABNORMAL LOW (ref >60)
Glucose, Bld: 83 mg/dL (ref 70–99)
Potassium: 4.7 mmol/L (ref 3.5–5.1)
Sodium: 136 mmol/L (ref 135–145)

## 2020-02-27 LAB — MRSA PCR SCREENING: MRSA by PCR: NEGATIVE

## 2020-02-27 LAB — CBC
HCT: 31.6 % — ABNORMAL LOW (ref 39.0–52.0)
HCT: 32.3 % — ABNORMAL LOW (ref 39.0–52.0)
Hemoglobin: 10.2 g/dL — ABNORMAL LOW (ref 13.0–17.0)
Hemoglobin: 10.1 g/dL — ABNORMAL LOW (ref 13.0–17.0)
MCH: 25.2 pg — ABNORMAL LOW (ref 26.0–34.0)
MCH: 24.6 pg — ABNORMAL LOW (ref 26.0–34.0)
MCHC: 32.3 g/dL (ref 30.0–36.0)
MCHC: 31.3 g/dL (ref 30.0–36.0)
MCV: 78.2 fL — ABNORMAL LOW (ref 80.0–100.0)
MCV: 78.6 fL — ABNORMAL LOW (ref 80.0–100.0)
Platelets: 197 10*3/uL (ref 150–400)
Platelets: 182 10*3/uL (ref 150–400)
RBC: 4.04 MIL/uL — ABNORMAL LOW (ref 4.22–5.81)
RBC: 4.11 MIL/uL — ABNORMAL LOW (ref 4.22–5.81)
RDW: 16.4 % — ABNORMAL HIGH (ref 11.5–15.5)
RDW: 15.9 % — ABNORMAL HIGH (ref 11.5–15.5)
WBC: 8.5 10*3/uL (ref 4.0–10.5)
WBC: 8.6 10*3/uL (ref 4.0–10.5)
nRBC: 0.5 % — ABNORMAL HIGH (ref 0.0–0.2)
nRBC: 0.6 % — ABNORMAL HIGH (ref 0.0–0.2)

## 2020-02-27 LAB — HEPARIN LEVEL (UNFRACTIONATED): Heparin Unfractionated: <0.1 IU/mL — ABNORMAL LOW (ref 0.30–0.70)

## 2020-02-27 LAB — LIPID PANEL
Cholesterol: 119 mg/dL (ref 0–200)
HDL: 38 mg/dL — ABNORMAL LOW (ref >40)
LDL Cholesterol: 65 mg/dL (ref 0–99)
Total CHOL/HDL Ratio: 3.1 RATIO
Triglycerides: 78 mg/dL (ref <150)
VLDL: 16 mg/dL (ref 0–40)

## 2020-02-27 LAB — HEMOGLOBIN A1C
Hgb A1c MFr Bld: 5.5 % (ref 4.8–5.6)
Mean Plasma Glucose: 111.15 mg/dL

## 2020-02-27 LAB — TROPONIN I (HIGH SENSITIVITY): Troponin I (High Sensitivity): 4308 ng/L (ref <18)

## 2020-02-27 MED ORDER — CHLORHEXIDINE GLUCONATE CLOTH 2 % EX PADS: 6.0000 | MEDICATED_PAD | Freq: Every day | CUTANEOUS | Status: DC

## 2020-02-27 MED ORDER — LIDOCAINE-PRILOCAINE 2.5-2.5 % EX CREA
1.0000 "application " | TOPICAL_CREAM | CUTANEOUS | Status: DC | PRN
  Filled 2020-02-27: qty 5

## 2020-02-27 MED ORDER — CARVEDILOL 6.25 MG PO TABS
6.2500 mg | ORAL_TABLET | Freq: Two times a day (BID) | ORAL | Status: DC
  Administered 2020-02-27 (×2): 6.25 mg via ORAL
  Filled 2020-02-27 (×2): qty 1

## 2020-02-27 MED ORDER — SODIUM CHLORIDE 0.9 % IV SOLN: 100.0000 mL | INTRAVENOUS | Status: DC | PRN

## 2020-02-27 MED ORDER — SODIUM CHLORIDE 0.9 % IV SOLN
100.0000 mL | INTRAVENOUS | Status: DC | PRN
Start: 2020-02-27 — End: 2020-02-28

## 2020-02-27 MED ORDER — LIDOCAINE HCL (PF) 1 % IJ SOLN: 5.0000 mL | INTRAMUSCULAR | Status: DC | PRN

## 2020-02-27 MED ORDER — PENTAFLUOROPROP-TETRAFLUOROETH EX AERO: 1.0000 "application " | INHALATION_SPRAY | CUTANEOUS | Status: DC | PRN

## 2020-02-27 MED ORDER — CARVEDILOL 6.25 MG PO TABS: 6.2500 mg | ORAL_TABLET | Freq: Two times a day (BID) | ORAL | Status: DC

## 2020-02-27 MED ORDER — ALTEPLASE 2 MG IJ SOLR: 2.0000 mg | Freq: Once | INTRAMUSCULAR | Status: DC | PRN

## 2020-02-27 MED ORDER — HEPARIN SODIUM (PORCINE) 1000 UNIT/ML DIALYSIS
1000.0000 [IU] | INTRAMUSCULAR | Status: DC | PRN
  Filled 2020-02-27: qty 1

## 2020-02-27 MED FILL — Verapamil HCl IV Soln 2.5 MG/ML: INTRAVENOUS | Qty: 2 | Status: AC

## 2020-02-27 NOTE — Progress Notes (Signed)
Progress Note  Patient Name: [name]. Date of Encounter: 02/27/2020  [facility] Cardiologist: Dr. Lendell Caprice  Subjective   No chest pain this morning.  He has maintained sinus rhythm.  Inpatient Medications    Scheduled Meds: . aspirin  81 mg Oral Daily  . atorvastatin  80 mg Oral QHS  . carvedilol  6.25 mg Oral BID WC  . Chlorhexidine Gluconate Cloth  6 each Topical Daily  . dorzolamide-timolol  1 drop Left Eye BID  . pantoprazole  40 mg Oral Daily  . prednisoLONE acetate  1 drop Left Eye Q4H  . sodium chloride flush  3 mL Intravenous Q12H  . tamsulosin  0.4 mg Oral QPC breakfast  . ticagrelor  90 mg Oral BID   Continuous Infusions: . sodium chloride 50 mL/hr at 02/26/20 1135  . sodium chloride     PRN Meds: sodium chloride, acetaminophen, HYDROcodone-acetaminophen, ondansetron (ZOFRAN) IV, sodium chloride flush   Vital Signs    Vitals:   02/27/20 0500 02/27/20 0600 02/27/20 0629 02/27/20 0700  BP: 128/62 (!) 147/73    Pulse: 65 66  69  Resp: 12 14  16   Temp:      TempSrc:      SpO2: 97% 93%  96%  Weight:   97.2 kg   Height:        Intake/Output Summary (Last 24 hours) at 02/27/2020 0850 Last data filed at 02/27/2020 0658 Gross per 24 hour  Intake 929.9 ml  Output -  Net 929.9 ml   Last 3 Weights 02/27/2020 02/26/2020 02/19/2020  Weight (lbs) 214 lb 4.6 oz 214 lb 12.8 oz 212 lb  Weight (kg) 97.2 kg 97.433 kg 96.163 kg  Some encounter information is confidential and restricted. Go to Review Flowsheets activity to see all data.      Telemetry    Normal sinus rhythm- Personally Reviewed  ECG    Normal sinus rhythm at sixty-eight with LVH- Personally Reviewed  Physical Exam   GEN: No acute distress.   Neck: No JVD Cardiac: RRR, no murmurs, rubs, or gallops.  Respiratory: Clear to auscultation bilaterally. GI: Soft, nontender, non-distended  MS: No edema; No deformity. Neuro:  Nonfocal  Psych: Normal affect   Labs    High  Sensitivity Troponin:   Recent Labs  Lab 02/26/20 1032 02/27/20 0622  TROPONINIHS 18* 4,308*      Chemistry Recent Labs  Lab 02/26/20 1032 02/27/20 0622  NA 139 136  K 4.3 4.7  CL 104 103  CO2 18* 17*  GLUCOSE 82 83  BUN 54* 63*  CREATININE 11.52* 14.23*  CALCIUM 8.3* 8.1*  PROT 6.4*  --   ALBUMIN 3.4*  --   AST 8*  --   ALT 7  --   ALKPHOS 70  --   BILITOT 1.1  --   GFRNONAA 5* 4*  ANIONGAP 17* 16*     Hematology Recent Labs  Lab 02/26/20 1032 02/27/20 0622  WBC 7.4 8.5  RBC 4.38 4.04*  HGB 10.9* 10.2*  HCT 35.3* 31.6*  MCV 80.6 78.2*  MCH 24.9* 25.2*  MCHC 30.9 32.3  RDW 15.9* 16.4*  PLT 197 197    BNPNo results for input(s): BNP, PROBNP in the last 168 hours.   DDimer No results for input(s): DDIMER in the last 168 hours.   Radiology    CARDIAC CATHETERIZATION  Result Date: 02/26/2020  The left ventricular systolic function is normal.  LV end diastolic pressure is normal.  The left ventricular ejection fraction is greater than 65% by visual estimate.  There is no aortic valve stenosis.  Mid LAD lesion is 90% stenosed.  A drug-eluting stent was successfully placed using a STENT RESOLUTE ONYX 3.5X30, postdilated to > 4 mm. Stent optimized with IVUS.  Post intervention, there is a 0% residual stenosis.  I suspect his abnormal ECG and anterior injury pattern was more from demand ischemia in the setting of hypotension and AFib with RVR, with severe mid LAD lesion.  During the cath, he converted to NSR briefly and his pulse pressure and BP returned to normal without any pressors. In addition, LVEDP only 11 mm Hg despite having 2.4 L NS administered on transport.  He may have been a little dehydrated after 3 L fluid removal after dialysis. In regards to AFib.  Continue IV Amiodarone.  Will have to decide if he needs longterm anticoagulation along with antiplatelet therapy.  If so, would change Brilinta to clopidogrel.  Would have to check what options work in  the setting of ESRD.   DG Chest Port 1 View  Result Date: 02/26/2020 CLINICAL DATA:  Code STEMI. EXAM: PORTABLE CHEST 1 VIEW COMPARISON:  06/04/2019 FINDINGS: Cardiac enlargement with normal pulmonary vascularity. Negative for heart failure, edema, fusion. Mild right lower lobe atelectasis. Central line is been removed since the prior study. IMPRESSION: Mild right lower lobe atelectasis. Left lung clear. Negative for congestive heart failure. Electronically Signed   By: Franchot Gallo M.D.   On: 02/26/2020 10:56    Cardiac Studies   Cardiac catheterization (02/27/2020)  Conclusion    The left ventricular systolic function is normal.  LV end diastolic pressure is normal.  The left ventricular ejection fraction is greater than 65% by visual estimate.  There is no aortic valve stenosis.  Mid LAD lesion is 90% stenosed.  A drug-eluting stent was successfully placed using a STENT RESOLUTE ONYX 3.5X30, postdilated to > 4 mm. Stent optimized with IVUS.  Post intervention, there is a 0% residual stenosis.   I suspect his abnormal ECG and anterior injury pattern was more from demand ischemia in the setting of hypotension and AFib with RVR, with severe mid LAD lesion.    During the cath, he converted to NSR briefly and his pulse pressure and BP returned to normal without any pressors. In addition, LVEDP only 11 mm Hg despite having 2.4 L NS administered on transport.  He may have been a little dehydrated after 3 L fluid removal after dialysis.   In regards to AFib.  Continue IV Amiodarone.  Will have to decide if he needs longterm anticoagulation along with antiplatelet therapy.  If so, would change Brilinta to clopidogrel.  Would have to check what options work in the setting of ESRD. Coronary Diagrams   Diagnostic Dominance: Right    Intervention       Patient Profile     50 y.o. male who lives in [place] and is dialyzed in [place] was transferred to [facility] after developing A. fib with RVR, hypotension and diffuse ST segment elevation.  A STEMI was activated.  He was transferred to [facility] where he underwent urgent cardiac cath revealing a mid LAD lesion that was intervened on.  He did have diabetes in the past but no longer.  Assessment & Plan    1: CAD-recent present presentation with STEMI EKG cath revealing a high-grade mid LAD lesion.  There is no other significant CAD.  He  underwent PCI and drug-eluting stenting postdilated to 4 mm.  His LV function was normal.  He is on dual antiplatelet therapy including aspirin and Brilinta.  His troponins did peak at four thousand.  2: PAF-history of PAF in the setting of dialysis.  He converted during the cardiac catheterization procedure with improvement in his hemodynamics.  He was put on IV amiodarone.  Given that this was in the setting of dialysis and he does not have otherwise documented PAF I do not think he requires long-term oral anticoagulation.  In addition, he has been on amiodarone overnight without recurrent arrhythmia.  I am going to discontinue this.  He is on carvedilol.  3: End-stage renal disease-on hemodialysis Tuesday/Thursday/Saturday.  He apparently missed his last Saturday hemodialysis session.  He is scheduled for dialysis tomorrow which we will arrange in the morning prior to discharge home.  Postop day one PCI drug-eluting stenting of mid LAD in the setting of EKG consistent with STEMI.  His EKG this morning is normal.  He said no recurrent A. fib.  He stable for transfer to a telemetry bed.  We will arrange hemodialysis tomorrow morning prior to discharge.  He will remain on dual antiplatelet therapy uninterrupted for at least 12 months.  For questions or updates, please contact [place] Please consult [email or web address] for contact info under        Signed, Quay Burow, MD  02/27/2020, 8:50 AM

## 2020-02-27 NOTE — Progress Notes (Signed)
Pt arrived to the unit alert and oriented. Pt is weighed, vitals are taken, and an assessment is done. Pt had his lunch. [name] needs are met and safety measures are maintained. Informed consent for dialysis is signed and put on [name] chart.

## 2020-02-27 NOTE — TOC Benefit Eligibility Note (Signed)
Transition of Care [facility]) Benefit Eligibility Note    Patient Details  Name: [name]. MRN: [id number] Date of Birth: [birth year]   Medication/Dose: BRILINTA  90 MG BID  Covered?: Yes  Tier: 3 Drug  Prescription Coverage Preferred Pharmacy: [facility] with Person/Company/Phone Number:: BILLIE  @ [facility] RX # [phone]  Co-Pay: $197.00  Prior Approval: No  Deductible: Unmet (OUT-OF-POCKET:UNMET)  Additional Notes: ALTERNATIVE : CLOPIDOGREL 75 MG DAILY  COVER- [place]- 1 DRUG P/A -NO    Memory Argue Phone Number: 02/27/2020, 9:45 AM

## 2020-02-27 NOTE — Progress Notes (Signed)
CARDIAC REHAB PHASE I   Went to offer to walk with pt. Pt asleep in bed. Materials left at bedside. Will f/u later.  Rufina Falco, RN BSN 02/27/2020 10:49 AM

## 2020-02-27 NOTE — Progress Notes (Signed)
CARDIAC REHAB PHASE I   Offered to walk with pt, pt declining, states he recently returned to bed. Pt educated on importance of ASA, Brilinta, and NTG. Pt given MI book and stent card. Deferred diet to dialysis. Reviewed site care, restrictions, and exercise guidelines. Will refer to CRP II [place]. Pt for HD tomorrow, hopeful for d/c after. Will f/u as pts schedule allows.  [place], RN BSN 02/27/2020 1:57 PM

## 2020-02-27 NOTE — Consult Note (Signed)
[facility]  HISTORY AND PHYSICAL  [name] Weslee Fogg. is an 50 y.o. male.    Chief Complaint:  STEMI. Consult for HD  HPI: [name] Kaushal Vannice. 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 Patient reports at the end of HD session yesterday he became tachycardic, hyptontensive, and he experienced shortness of breath. EMS took him [facility] where EKG showed ST elevations, code STEMI was activated , and patient was transferred to [facility]. Patient is now s/p PCI and DES for LAD lesion. He started HD 05/2019 and is currently undergoing workup at [facility] for possible transplant. 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   Today on exam he is chest pain free. He denies any usual complications surrounding his HD.     PMH: Past Medical History:  Diagnosis Date  . Anxiety   . Cellulitis   . CHF (congestive heart failure) ([place])   . Chronic diarrhea   . Chronic kidney disease   . Chronic pain of both ankles   . Diabetes mellitus without complication ([place])   . Diabetes mellitus, type II ([place])   . Diabetic neuropathy ([place])   . Frequent falls   . Gait instability   . GERD (gastroesophageal reflux disease)   . Heart palpitations   . History of kidney stones   . HLD (hyperlipidemia)   . Hypertension   . Insomnia   . Nausea and vomiting in adult    recurrent   PSH: Past Surgical History:  Procedure Laterality Date  . AMPUTATION Left 03/16/2016   Procedure: AMPUTATION DIGIT LEFT HALLUX;  Surgeon: Trula Slade, DPM;  Location: [place];  Service: Podiatry;  Laterality: Left;  can start around 5   . ARTHROSCOPIC REPAIR ACL  Left   . AV FISTULA PLACEMENT Right 05/31/2019   Procedure: Brachiocephalic AV fistula creation;  Surgeon: Algernon Huxley, MD;  Location: [name] ORS;  Service: Vascular;  Laterality: Right;  . COLONOSCOPY WITH PROPOFOL N/A 10/28/2015   Procedure: COLONOSCOPY WITH PROPOFOL;  Surgeon: Lollie Sails, MD;  Location: [facility] ENDOSCOPY;  Service: Endoscopy;  Laterality: N/A;  . COLONOSCOPY WITH PROPOFOL N/A 10/29/2015   Procedure: COLONOSCOPY WITH PROPOFOL;  Surgeon: Lollie Sails, MD;  Location: [facility] ENDOSCOPY;  Service: Endoscopy;  Laterality: N/A;  . CORONARY/GRAFT ACUTE MI REVASCULARIZATION N/A 02/26/2020   Procedure: Coronary/Graft Acute MI Revascularization;  Surgeon: Jettie Booze, MD;  Location: [place] CV LAB;  Service: Cardiovascular;  Laterality: N/A;  . DIALYSIS/PERMA CATHETER INSERTION Right 04/26/2019   Perm Cath   . DIALYSIS/PERMA CATHETER INSERTION N/A 04/26/2019   Procedure: DIALYSIS/PERMA CATHETER INSERTION;  Surgeon: Algernon Huxley, MD;  Location: [place] [facility];  Service: Cardiovascular;  Laterality: N/A;  . DIALYSIS/PERMA CATHETER REMOVAL N/A 08/15/2019   Procedure: DIALYSIS/PERMA CATHETER REMOVAL;  Surgeon: Katha Cabal, MD;  Location: [place] [facility];  Service: Cardiovascular;  Laterality: N/A;  . ESOPHAGOGASTRODUODENOSCOPY (EGD) WITH PROPOFOL N/A 12/27/2017   Procedure: ESOPHAGOGASTRODUODENOSCOPY (EGD) WITH PROPOFOL;  Surgeon: Toledo, Benay Pike, MD;  Location: [name] ENDOSCOPY;  Service: Gastroenterology;  Laterality: N/A;  . INTRAVASCULAR ULTRASOUND/IVUS N/A 02/26/2020   Procedure: Intravascular  Ultrasound/IVUS;  Surgeon: Jettie Booze, MD;  Location: [place] [facility];  Service: Cardiovascular;  Laterality: N/A;  . LEFT HEART CATH AND CORONARY ANGIOGRAPHY N/A 02/26/2020   Procedure: LEFT HEART CATH AND CORONARY ANGIOGRAPHY;  Surgeon: Jettie Booze, MD;  Location: [place] [facility];  Service: Cardiovascular;  Laterality: N/A;  . PROSTATE SURGERY  2016   . TONSILECTOMY/ADENOIDECTOMY WITH MYRINGOTOMY    . TONSILLECTOMY      Past Medical History:  Diagnosis Date  . Anxiety   . Cellulitis   . CHF (congestive heart failure) ([place])   . Chronic diarrhea   . Chronic kidney disease   . Chronic pain of both ankles   . Diabetes mellitus without complication ([place])   . Diabetes mellitus, type II ([place])   . Diabetic neuropathy ([place])   . Frequent falls   . Gait instability   . GERD (gastroesophageal reflux disease)   . Heart palpitations   . History of kidney stones   . HLD (hyperlipidemia)   . Hypertension   . Insomnia   . Nausea and vomiting in adult    recurrent    Medications:  I have reviewed the patient's current medications.  Medications Prior to Admission  Medication Sig Dispense Refill  . carvedilol (COREG) 25 MG tablet Take 25 mg by mouth every morning.    . dorzolamide-timolol (COSOPT) 22.3-6.8 MG/ML ophthalmic solution Place 1 drop into the left eye 2 (two) times daily.    Marland Kitchen lidocaine-prilocaine (EMLA) cream Apply 1 application topically Every Tuesday,Thursday,and Saturday with dialysis.     Marland Kitchen omeprazole (PRILOSEC) 40 MG capsule Take 80 mg by mouth 2 (two) times a day.     . prednisoLONE acetate (PRED FORTE) 1 % ophthalmic suspension Place 1 drop into the left eye every 4 (four) hours.    . tamsulosin (FLOMAX) 0.4 MG CAPS capsule Take 0.4 mg by mouth daily after breakfast.     . apixaban (ELIQUIS) 5 MG TABS tablet Take 2 tablets (10 mg total) by mouth once for 1 dose. (Patient not taking: Reported on 02/19/2020) 1 tablet 0    ALLERGIES:   Allergies  Allergen Reactions  . Levofloxacin Swelling    Of face  . Promethazine Diarrhea and Other (See Comments)    Other reaction(s): Other (See Comments), Other (See Comments) Other reaction(s): Muscle cramps Muscle cramps Cramping Other reaction(s): Muscle cramps Muscle cramps  . Malt Other (See Comments)    Other reaction(s): cramping    FAM HX: Family History   Problem Relation Age of Onset  . CAD Father   . Stroke Father   . Diabetes Mellitus II Mother   . Kidney failure Mother   . Schizophrenia Mother     Social History:   reports that he has never smoked. He has never used smokeless tobacco. He reports that he does not drink alcohol and does not use drugs.  ROS: Review of Systems  Constitutional: Negative for chills and fever.  Eyes: Positive for blurred vision (chronic problem, left eye). Negative for pain.  Respiratory: Negative for cough and shortness of breath.   Cardiovascular: Negative for chest pain and palpitations.  Gastrointestinal: Negative for nausea and vomiting.  Genitourinary: Negative for flank pain and hematuria.  Musculoskeletal: Negative for falls and myalgias.  Skin: Negative for itching and rash.  Neurological: Negative for dizziness and headaches.  Psychiatric/Behavioral: Negative for depression. The patient is not nervous/anxious.     Blood pressure (!) 151/75, pulse 66, temperature 98.5 F (  36.9 C), temperature source Oral, resp. rate 16, height 6\' 3"  (1.905 m), weight 97.2 kg, SpO2 96 %. PHYSICAL EXAM: Physical Exam Constitutional:      Appearance: Normal appearance.  HENT:     Head: Normocephalic and atraumatic.     Right Ear: External ear normal.     Left Ear: External ear normal.     Mouth/Throat:     Mouth: Mucous membranes are moist.  Eyes:     General: No scleral icterus. Cardiovascular:     Rate and Rhythm: Normal rate and regular rhythm.  Pulmonary:     Effort: Pulmonary effort is normal.     Breath sounds: Normal breath sounds.  Abdominal:     General: Abdomen is flat. Bowel sounds are normal.     Palpations: Abdomen is soft.  Musculoskeletal:        General: No swelling or tenderness.  Skin:    General: Skin is warm and dry.  Neurological:     General: No focal deficit present.     Mental Status: He is alert. Mental status is at baseline.  Psychiatric:        Mood and Affect:  Mood normal.        Behavior: Behavior normal.      Results for orders placed or performed during the hospital encounter of 02/26/20 (from the past 48 hour(s))  Resp Panel by RT-PCR (Flu A&B, Covid) Nasopharyngeal Swab     Status: None   Collection Time: 02/26/20 10:22 AM   Specimen: Nasopharyngeal Swab; Nasopharyngeal(NP) swabs in vial transport medium  Result Value Ref Range   SARS Coronavirus 2 by RT PCR NEGATIVE NEGATIVE    Comment: (NOTE) SARS-CoV-2 target nucleic acids are NOT DETECTED.  The SARS-CoV-2 RNA is generally detectable in upper respiratory specimens during the acute phase of infection. The lowest concentration of SARS-CoV-2 viral copies this assay can detect is 138 copies/mL. A negative result does not preclude SARS-Cov-2 infection and should not be used as the sole basis for treatment or other patient management decisions. A negative result may occur with  improper specimen collection/handling, submission of specimen other than nasopharyngeal swab, presence of viral mutation(s) within the areas targeted by this assay, and inadequate number of viral copies(<138 copies/mL). A negative result must be combined with clinical observations, patient history, and epidemiological information. The expected result is Negative.  Fact Sheet for Patients:  EntrepreneurPulse.com.au  Fact Sheet for Healthcare Providers:  IncredibleEmployment.be  This test is no t yet approved or cleared by the [place] FDA and  has been authorized for detection and/or diagnosis of SARS-CoV-2 by FDA under an Emergency Use Authorization (EUA). This EUA will remain  in effect (meaning this test can be used) for the duration of the COVID-19 declaration under Section 564(b)(1) of the Act, 21 U.S.C.section 360bbb-3(b)(1), unless the authorization is terminated  or revoked sooner.       Influenza A by PCR NEGATIVE NEGATIVE   Influenza B by PCR NEGATIVE  NEGATIVE    Comment: (NOTE) The Xpert Xpress SARS-CoV-2/FLU/RSV plus assay is intended as an aid in the diagnosis of influenza from Nasopharyngeal swab specimens and should not be used as a sole basis for treatment. Nasal washings and aspirates are unacceptable for Xpert Xpress SARS-CoV-2/FLU/RSV testing.  Fact Sheet for Patients: EntrepreneurPulse.com.au  Fact Sheet for Healthcare Providers: IncredibleEmployment.be  This test is not yet approved or cleared by the [place] FDA and has been authorized for detection and/or diagnosis of SARS-CoV-2 by FDA  under an Emergency Use Authorization (EUA). This EUA will remain in effect (meaning this test can be used) for the duration of the COVID-19 declaration under Section 564(b)(1) of the Act, 21 U.S.C. section 360bbb-3(b)(1), unless the authorization is terminated or revoked.  Performed at [facility], [address], [place], [place] [address]   Hemoglobin A1c     Status: None   Collection Time: 02/26/20 10:32 AM  Result Value Ref Range   Hgb A1c MFr Bld 5.4 4.8 - 5.6 %    Comment: (NOTE) Pre diabetes:          5.7%-6.4%  Diabetes:              >6.4%  Glycemic control for   <7.0% adults with diabetes    Mean Plasma Glucose 108.28 mg/dL    Comment: Performed at [facility] [address], [place], [place] [address]  CBC with Differential/Platelet     Status: Abnormal   Collection Time: 02/26/20 10:32 AM  Result Value Ref Range   WBC 7.4 4.0 - 10.5 K/uL   RBC 4.38 4.22 - 5.81 MIL/uL   Hemoglobin 10.9 (L) 13.0 - 17.0 g/dL   HCT 35.3 (L) 39.0 - 52.0 %   MCV 80.6 80.0 - 100.0 fL   MCH 24.9 (L) 26.0 - 34.0 pg   MCHC 30.9 30.0 - 36.0 g/dL   RDW 15.9 (H) 11.5 - 15.5 %   Platelets 197 150 - 400 K/uL   nRBC 0.7 (H) 0.0 - 0.2 %   Neutrophils Relative % 69 %   Neutro Abs 5.1 1.7 - 7.7 K/uL   Lymphocytes Relative 17 %   Lymphs Abs 1.3 0.7 - 4.0 K/uL   Monocytes Relative 9 %    Monocytes Absolute 0.7 0.1 - 1.0 K/uL   Eosinophils Relative 4 %   Eosinophils Absolute 0.3 0.0 - 0.5 K/uL   Basophils Relative 1 %   Basophils Absolute 0.0 0.0 - 0.1 K/uL   Immature Granulocytes 0 %   Abs Immature Granulocytes 0.03 0.00 - 0.07 K/uL    Comment: Performed at [facility], [address], [place], [place] [address]  Protime-INR     Status: None   Collection Time: 02/26/20 10:32 AM  Result Value Ref Range   Prothrombin Time 14.0 11.4 - 15.2 seconds   INR 1.1 0.8 - 1.2    Comment: (NOTE) INR goal varies based on device and disease states. Performed at [facility], [address], [place], [place] [address]   APTT     Status: None   Collection Time: 02/26/20 10:32 AM  Result Value Ref Range   aPTT 33 24 - 36 seconds    Comment: Performed at [facility], [address] [place], [place] [address]  Comprehensive metabolic panel     Status: Abnormal   Collection Time: 02/26/20 10:32 AM  Result Value Ref Range   Sodium 139 135 - 145 mmol/L   Potassium 4.3 3.5 - 5.1 mmol/L   Chloride 104 98 - 111 mmol/L   CO2 18 (L) 22 - 32 mmol/L   Glucose, Bld 82 70 - 99 mg/dL    Comment: Glucose reference range applies only to samples taken after fasting for at least 8 hours.   BUN 54 (H) 6 - 20 mg/dL   Creatinine, Ser 11.52 (H) 0.61 - 1.24 mg/dL   Calcium 8.3 (L) 8.9 - 10.3 mg/dL   Total Protein 6.4 (L) 6.5 - 8.1 g/dL   Albumin 3.4 (  L) 3.5 - 5.0 g/dL   AST 8 (L) 15 - 41 U/L   ALT 7 0 - 44 U/L   Alkaline Phosphatase 70 38 - 126 U/L   Total Bilirubin 1.1 0.3 - 1.2 mg/dL   GFR, Estimated 5 (L) >60 mL/min    Comment: (NOTE) Calculated using the CKD-EPI Creatinine Equation (2021)    Anion gap 17 (H) 5 - 15    Comment: Performed at [facility], [address], [place], [place] [address]  Troponin I (High Sensitivity)     Status: Abnormal   Collection Time: 02/26/20 10:32 AM  Result Value Ref Range   Troponin I (High Sensitivity) 18 (H) <18 ng/L    Comment: (NOTE) Elevated high  sensitivity troponin I (hsTnI) values and significant  changes across serial measurements may suggest ACS but many other  chronic and acute conditions are known to elevate hsTnI results.  Refer to the "Links" section for chest pain algorithms and additional  guidance. Performed at [facility], [address] [place], [place] [address]   Lipid panel     Status: Abnormal   Collection Time: 02/26/20 10:33 AM  Result Value Ref Range   Cholesterol 126 0 - 200 mg/dL   Triglycerides 130 <150 mg/dL   HDL 39 (L) >40 mg/dL   Total CHOL/HDL Ratio 3.2 RATIO   VLDL 26 0 - 40 mg/dL   LDL Cholesterol 61 0 - 99 mg/dL    Comment:        Total Cholesterol/HDL:CHD Risk Coronary Heart Disease Risk Table                     Men   Women  1/2 Average Risk   3.4   3.3  Average Risk       5.0   4.4  2 X Average Risk   9.6   7.1  3 X Average Risk  23.4   11.0        Use the calculated Patient Ratio above and the CHD Risk Table to determine the patient's CHD Risk.        ATP III CLASSIFICATION (LDL):  <100     mg/dL   Optimal  100-129  mg/dL   Near or Above                    Optimal  130-159  mg/dL   Borderline  160-189  mg/dL   High  >190     mg/dL   Very High Performed at [facility], [address] [place], [place] [address]   MRSA PCR Screening     Status: None   Collection Time: 02/26/20 10:36 PM   Specimen: Nasopharyngeal  Result Value Ref Range   MRSA by PCR NEGATIVE NEGATIVE    Comment:        The GeneXpert MRSA Assay (FDA approved for NASAL specimens only), is one component of a comprehensive MRSA colonization surveillance program. It is not intended to diagnose MRSA infection nor to guide or monitor treatment for MRSA infections. Performed at [facility], [place] [address] [place], [place] [address]   Basic metabolic panel     Status: Abnormal   Collection Time: 02/27/20  6:22 AM  Result Value Ref Range   Sodium 136 135 - 145 mmol/L   Potassium 4.7 3.5 - 5.1 mmol/L    Chloride 103 98 - 111 mmol/L   CO2 17 (L) 22 - 32 mmol/L   Glucose,  Bld 83 70 - 99 mg/dL    Comment: Glucose reference range applies only to samples taken after fasting for at least 8 hours.   BUN 63 (H) 6 - 20 mg/dL   Creatinine, Ser 14.23 (H) 0.61 - 1.24 mg/dL   Calcium 8.1 (L) 8.9 - 10.3 mg/dL   GFR, Estimated 4 (L) >60 mL/min    Comment: (NOTE) Calculated using the CKD-EPI Creatinine Equation (2021)    Anion gap 16 (H) 5 - 15    Comment: Performed at [facility] [address] [place], [place] [address]  CBC     Status: Abnormal   Collection Time: 02/27/20  6:22 AM  Result Value Ref Range   WBC 8.5 4.0 - 10.5 K/uL   RBC 4.04 (L) 4.22 - 5.81 MIL/uL   Hemoglobin 10.2 (L) 13.0 - 17.0 g/dL   HCT 31.6 (L) 39.0 - 52.0 %   MCV 78.2 (L) 80.0 - 100.0 fL   MCH 25.2 (L) 26.0 - 34.0 pg   MCHC 32.3 30.0 - 36.0 g/dL   RDW 16.4 (H) 11.5 - 15.5 %   Platelets 197 150 - 400 K/uL    Comment: REPEATED TO VERIFY   nRBC 0.5 (H) 0.0 - 0.2 %    Comment: Performed at [facility], [place] [address] [place], [place] [address]  Hemoglobin A1c     Status: None   Collection Time: 02/27/20  6:22 AM  Result Value Ref Range   Hgb A1c MFr Bld 5.5 4.8 - 5.6 %    Comment: (NOTE) Pre diabetes:          5.7%-6.4%  Diabetes:              >6.4%  Glycemic control for   <7.0% adults with diabetes    Mean Plasma Glucose 111.15 mg/dL    Comment: Performed at [facility] [address], [place], [place] [address]  Lipid panel     Status: Abnormal   Collection Time: 02/27/20  6:22 AM  Result Value Ref Range   Cholesterol 119 0 - 200 mg/dL   Triglycerides 78 <150 mg/dL   HDL 38 (L) >40 mg/dL   Total CHOL/HDL Ratio 3.1 RATIO   VLDL 16 0 - 40 mg/dL   LDL Cholesterol 65 0 - 99 mg/dL    Comment:        Total Cholesterol/HDL:CHD Risk Coronary Heart Disease Risk Table                     Men   Women  1/2 Average Risk   3.4   3.3  Average Risk       5.0   4.4  2 X Average Risk   9.6    7.1  3 X Average Risk  23.4   11.0        Use the calculated Patient Ratio above and the CHD Risk Table to determine the patient's CHD Risk.        ATP III CLASSIFICATION (LDL):  <100     mg/dL   Optimal  100-129  mg/dL   Near or Above                    Optimal  130-159  mg/dL   Borderline  160-189  mg/dL   High  >190     mg/dL   Very High Performed at [facility] [address] [place], [place] [address]  Troponin I (High Sensitivity)     Status: Abnormal   Collection Time: 02/27/20  6:22 AM  Result Value Ref Range   Troponin I (High Sensitivity) 4,308 (HH) <18 ng/L    Comment: CRITICAL RESULT CALLED TO, READ BACK BY AND VERIFIED WITH: CONNAN,E RN @0726  ON [id number] BY FLEMINGS (NOTE) Elevated high sensitivity troponin I (hsTnI) values and significant  changes across serial measurements may suggest ACS but many other  chronic and acute conditions are known to elevate hsTnI results.  Refer to the Links section for chest pain algorithms and additional  guidance. Performed at [facility], [place] [address] [place], [place] [address]   Heparin level (unfractionated)     Status: Abnormal   Collection Time: 02/27/20  8:28 AM  Result Value Ref Range   Heparin Unfractionated <0.10 (L) 0.30 - 0.70 IU/mL    Comment: (NOTE) If heparin results are below expected values, and patient dosage has  been confirmed, suggest follow up testing of antithrombin III levels. Performed at [facility], [place] [address], [place], [place] [address]     CARDIAC CATHETERIZATION  Result Date: 02/26/2020  The left ventricular systolic function is normal.  LV end diastolic pressure is normal.  The left ventricular ejection fraction is greater than 65% by visual estimate.  There is no aortic valve stenosis.  Mid LAD lesion is 90% stenosed.  A drug-eluting stent was successfully placed using a STENT RESOLUTE ONYX 3.5X30, postdilated to > 4 mm. Stent optimized with IVUS.  Post  intervention, there is a 0% residual stenosis.  I suspect his abnormal ECG and anterior injury pattern was more from demand ischemia in the setting of hypotension and AFib with RVR, with severe mid LAD lesion.  During the cath, he converted to NSR briefly and his pulse pressure and BP returned to normal without any pressors. In addition, LVEDP only 11 mm Hg despite having 2.4 L NS administered on transport.  He may have been a little dehydrated after 3 L fluid removal after dialysis. In regards to AFib.  Continue IV Amiodarone.  Will have to decide if he needs longterm anticoagulation along with antiplatelet therapy.  If so, would change Brilinta to clopidogrel.  Would have to check what options work in the setting of ESRD.   DG Chest Port 1 View  Result Date: 02/26/2020 CLINICAL DATA:  Code STEMI. EXAM: PORTABLE CHEST 1 VIEW COMPARISON:  06/04/2019 FINDINGS: Cardiac enlargement with normal pulmonary vascularity. Negative for heart failure, edema, fusion. Mild right lower lobe atelectasis. Central line is been removed since the prior study. IMPRESSION: Mild right lower lobe atelectasis. Left lung clear. Negative for congestive heart failure. Electronically Signed   By: Franchot Gallo M.D.   On: 02/26/2020 10:56    Assessment/Plan [name] Ericson Nafziger. is a 50 year old man with ESRD on HD who was admitted with STEMI now status post drug-eluting stent.  #ESRD on TTHS HD - Plan for HD tomorrow, will try to schedule on first shift of HD. Reported dry weight 95.5 , patient 98.4 kg today. We likely not aim for dry weight to prevent cardiac stunning. Potassium and corrected Ca wnl. BUN 62, patient is alert and oriented on exam. Had discussion with patient and shared it is likely transplant will likely be delayed by at least 6 months due to STEMI. Encourage him to focus on recovery and participate in cardiac rehab if available.  #CAD - Managed by cardiology. He presented with STEMI, s/p DES stent to  LAD.  He is on  DAT with Asprin and Brilinta. He was noted to have nl LV function during heart catheterization  #PAF: In setting of HD. NSR on exam. Now on carvediol and off amiodarone. Electolytes wnl, RFP for the morning.    #HTN - Patient normotensive on Carvediol 25 mg   #BPH - Tamsulosin   #FEN - HH diet    Lorene Dy 02/27/2020, 10:45 AM

## 2020-02-28 ENCOUNTER — Other Ambulatory Visit (HOSPITAL_COMMUNITY): Payer: Self-pay | Admitting: Cardiology

## 2020-02-28 DIAGNOSIS — I480 Paroxysmal atrial fibrillation: Secondary | ICD-10-CM | POA: Diagnosis not present

## 2020-02-28 DIAGNOSIS — I2109 ST elevation (STEMI) myocardial infarction involving other coronary artery of anterior wall: Secondary | ICD-10-CM | POA: Diagnosis not present

## 2020-02-28 LAB — CBC
HCT: 32.1 % — ABNORMAL LOW (ref 39.0–52.0)
Hemoglobin: 9.8 g/dL — ABNORMAL LOW (ref 13.0–17.0)
MCH: 24.5 pg — ABNORMAL LOW (ref 26.0–34.0)
MCHC: 30.5 g/dL (ref 30.0–36.0)
MCV: 80.3 fL (ref 80.0–100.0)
Platelets: 175 10*3/uL (ref 150–400)
RBC: 4 MIL/uL — ABNORMAL LOW (ref 4.22–5.81)
RDW: 16.1 % — ABNORMAL HIGH (ref 11.5–15.5)
WBC: 9.6 10*3/uL (ref 4.0–10.5)
nRBC: 0 % (ref 0.0–0.2)

## 2020-02-28 LAB — BASIC METABOLIC PANEL
Anion gap: 17 — ABNORMAL HIGH (ref 5–15)
BUN: 71 mg/dL — ABNORMAL HIGH (ref 6–20)
CO2: 17 mmol/L — ABNORMAL LOW (ref 22–32)
Calcium: 8 mg/dL — ABNORMAL LOW (ref 8.9–10.3)
Chloride: 102 mmol/L (ref 98–111)
Creatinine, Ser: 15.9 mg/dL — ABNORMAL HIGH (ref 0.61–1.24)
GFR, Estimated: 3 mL/min — ABNORMAL LOW (ref >60)
Glucose, Bld: 79 mg/dL (ref 70–99)
Potassium: 4.8 mmol/L (ref 3.5–5.1)
Sodium: 136 mmol/L (ref 135–145)

## 2020-02-28 LAB — HEPATITIS B SURFACE ANTIGEN: Hepatitis B Surface Ag: NONREACTIVE

## 2020-02-28 MED ORDER — ASPIRIN 81 MG PO CHEW: 81.0000 mg | CHEWABLE_TABLET | Freq: Every day | ORAL | 1 refills | Status: DC

## 2020-02-28 MED ORDER — TICAGRELOR 90 MG PO TABS: 90.0000 mg | ORAL_TABLET | Freq: Two times a day (BID) | ORAL | 2 refills | Status: DC

## 2020-02-28 MED ORDER — NITROGLYCERIN 0.4 MG SL SUBL: 0.4000 mg | SUBLINGUAL_TABLET | SUBLINGUAL | 2 refills | Status: DC | PRN

## 2020-02-28 MED ORDER — ATORVASTATIN CALCIUM 80 MG PO TABS: 80.0000 mg | ORAL_TABLET | Freq: Every day | ORAL | 0 refills | Status: DC

## 2020-02-28 MED FILL — BRILINTA 90 MG TABLET: 90 | 30 days supply | Qty: 60 | Fill #0

## 2020-02-28 MED FILL — NITROGLYCERIN 0.4 MG TAB SL: 0.4 | 7 days supply | Qty: 25 | Fill #0

## 2020-02-28 MED FILL — ASPIRIN LOW DOSE 81 MG CHEW: 81 | 90 days supply | Qty: 90 | Fill #0

## 2020-02-28 MED FILL — ATORVASTATIN CALCIUM 80 MG: 80 | 90 days supply | Qty: 90 | Fill #0

## 2020-02-28 NOTE — Progress Notes (Signed)
Progress Note  Patient Name: [name]. Date of Encounter: 02/28/2020  [facility] Cardiologist: Dr. Lendell Caprice  Subjective   No chest pain this morning.  He has maintained sinus rhythm.  Currently getting hemodialysis.  Discharge after that  Inpatient Medications    Scheduled Meds: . aspirin  81 mg Oral Daily  . atorvastatin  80 mg Oral QHS  . carvedilol  6.25 mg Oral BID WC  . Chlorhexidine Gluconate Cloth  6 each Topical Daily  . Chlorhexidine Gluconate Cloth  6 each Topical Q0600  . dorzolamide-timolol  1 drop Left Eye BID  . pantoprazole  40 mg Oral Daily  . prednisoLONE acetate  1 drop Left Eye Q4H  . sodium chloride flush  3 mL Intravenous Q12H  . tamsulosin  0.4 mg Oral QPC breakfast  . ticagrelor  90 mg Oral BID   Continuous Infusions: . sodium chloride 50 mL/hr at 02/26/20 1135  . sodium chloride    . sodium chloride    . sodium chloride     PRN Meds: sodium chloride, sodium chloride, sodium chloride, acetaminophen, alteplase, heparin, HYDROcodone-acetaminophen, lidocaine (PF), lidocaine-prilocaine, ondansetron (ZOFRAN) IV, pentafluoroprop-tetrafluoroeth, sodium chloride flush   Vital Signs    Vitals:   02/28/20 0754 02/28/20 0804 02/28/20 0840 02/28/20 0910  BP: (!) 159/73 (!) 157/73 (!) 148/79 (!) 173/77  Pulse: 72     Resp: 13 13 11 12   Temp: 97.7 F (36.5 C)     TempSrc:      SpO2: 97%     Weight:      Height:        Intake/Output Summary (Last 24 hours) at 02/28/2020 1000 Last data filed at 02/27/2020 1300 Gross per 24 hour  Intake 480 ml  Output -  Net 480 ml   Last 3 Weights 02/28/2020 02/28/2020 02/27/2020  Weight (lbs) 216 lb 11.4 oz 215 lb 8 oz 216 lb 14.9 oz  Weight (kg) 98.3 kg 97.75 kg 98.4 kg  Some encounter information is confidential and restricted. Go to Review Flowsheets activity to see all data.      Telemetry    Normal sinus rhythm- Personally Reviewed  ECG    Not performed today- Personally Reviewed  Physical  Exam   GEN: No acute distress.   Neck: No JVD Cardiac: RRR, no murmurs, rubs, or gallops.  Respiratory: Clear to auscultation bilaterally. GI: Soft, nontender, non-distended  MS: No edema; No deformity. Neuro:  Nonfocal  Psych: Normal affect   Labs    High Sensitivity Troponin:   Recent Labs  Lab 02/26/20 1032 02/27/20 0622  TROPONINIHS 18* 4,308*      Chemistry Recent Labs  Lab 02/26/20 1032 02/27/20 0622 02/27/20 1407 02/28/20 0347  NA 139 136 136 136  K 4.3 4.7 4.9 4.8  CL 104 103 103 102  CO2 18* 17* 18* 17*  GLUCOSE 82 83 101* 79  BUN 54* 63* 66* 71*  CREATININE 11.52* 14.23* 14.71* 15.90*  CALCIUM 8.3* 8.1* 8.1* 8.0*  PROT 6.4*  --   --   --   ALBUMIN 3.4*  --  3.2*  --   AST 8*  --   --   --   ALT 7  --   --   --   ALKPHOS 70  --   --   --   BILITOT 1.1  --   --   --   GFRNONAA 5* 4* 4* 3*  ANIONGAP 17* 16* 15 17*  Hematology Recent Labs  Lab 02/27/20 0622 02/27/20 1407 02/28/20 0744  WBC 8.5 8.6 9.6  RBC 4.04* 4.11* 4.00*  HGB 10.2* 10.1* 9.8*  HCT 31.6* 32.3* 32.1*  MCV 78.2* 78.6* 80.3  MCH 25.2* 24.6* 24.5*  MCHC 32.3 31.3 30.5  RDW 16.4* 15.9* 16.1*  PLT 197 182 175    BNPNo results for input(s): BNP, PROBNP in the last 168 hours.   DDimer No results for input(s): DDIMER in the last 168 hours.   Radiology    CARDIAC CATHETERIZATION  Result Date: 02/26/2020  The left ventricular systolic function is normal.  LV end diastolic pressure is normal.  The left ventricular ejection fraction is greater than 65% by visual estimate.  There is no aortic valve stenosis.  Mid LAD lesion is 90% stenosed.  A drug-eluting stent was successfully placed using a STENT RESOLUTE ONYX 3.5X30, postdilated to > 4 mm. Stent optimized with IVUS.  Post intervention, there is a 0% residual stenosis.  I suspect his abnormal ECG and anterior injury pattern was more from demand ischemia in the setting of hypotension and AFib with RVR, with severe mid LAD  lesion.  During the cath, he converted to NSR briefly and his pulse pressure and BP returned to normal without any pressors. In addition, LVEDP only 11 mm Hg despite having 2.4 L NS administered on transport.  He may have been a little dehydrated after 3 L fluid removal after dialysis. In regards to AFib.  Continue IV Amiodarone.  Will have to decide if he needs longterm anticoagulation along with antiplatelet therapy.  If so, would change Brilinta to clopidogrel.  Would have to check what options work in the setting of ESRD.   DG Chest Port 1 View  Result Date: 02/26/2020 CLINICAL DATA:  Code STEMI. EXAM: PORTABLE CHEST 1 VIEW COMPARISON:  06/04/2019 FINDINGS: Cardiac enlargement with normal pulmonary vascularity. Negative for heart failure, edema, fusion. Mild right lower lobe atelectasis. Central line is been removed since the prior study. IMPRESSION: Mild right lower lobe atelectasis. Left lung clear. Negative for congestive heart failure. Electronically Signed   By: Franchot Gallo M.D.   On: 02/26/2020 10:56    Cardiac Studies   Cardiac catheterization (02/27/2020)  Conclusion    The left ventricular systolic function is normal.  LV end diastolic pressure is normal.  The left ventricular ejection fraction is greater than 65% by visual estimate.  There is no aortic valve stenosis.  Mid LAD lesion is 90% stenosed.  A drug-eluting stent was successfully placed using a STENT RESOLUTE ONYX 3.5X30, postdilated to > 4 mm. Stent optimized with IVUS.  Post intervention, there is a 0% residual stenosis.   I suspect his abnormal ECG and anterior injury pattern was more from demand ischemia in the setting of hypotension and AFib with RVR, with severe mid LAD lesion.    During the cath, he converted to NSR briefly and his pulse pressure and BP returned to normal without any pressors. In addition, LVEDP only 11 mm Hg despite having 2.4 L NS administered on transport.  He may have been a little  dehydrated after 3 L fluid removal after dialysis.   In regards to AFib.  Continue IV Amiodarone.  Will have to decide if he needs longterm anticoagulation along with antiplatelet therapy.  If so, would change Brilinta to clopidogrel.  Would have to check what options work in the setting of ESRD. Coronary Diagrams   Diagnostic Dominance: Right    Intervention  Patient Profile     50 y.o. male who lives in [place] and is dialyzed in [place] was transferred to [facility] after developing A. fib with RVR, hypotension and diffuse ST segment elevation.  A STEMI was activated.  He was transferred to [facility] where he underwent urgent cardiac cath revealing a mid LAD lesion that was intervened on.  He did have diabetes in the past but no longer.  Assessment & Plan    1: CAD-recent present presentation with STEMI EKG cath revealing a high-grade mid LAD lesion.  There is no other significant CAD.  He underwent PCI and drug-eluting stenting postdilated to 4 mm.  His LV function was normal.  He is on dual antiplatelet therapy including aspirin and Brilinta.  His troponins did peak at four thousand.  2: PAF-history of PAF in the setting of dialysis.  He converted during the cardiac catheterization procedure with improvement in his hemodynamics.  He was put on IV amiodarone.  Given that this was in the setting of dialysis and he does not have otherwise documented PAF I do not think he requires long-term oral anticoagulation.  In addition, he has been on amiodarone overnight without recurrent arrhythmia.  I am going to discontinue this.  He is on carvedilol.  3: End-stage renal disease-on hemodialysis Tuesday/Thursday/Saturday.  He apparently missed his last Saturday hemodialysis session.  He is currently getting hemodialysis.  He can be discharged after that  Postop day one PCI drug-eluting stenting of mid LAD in the setting of EKG consistent with STEMI.   His EKG this morning is normal.  He said no recurrent A. fib.  He stable for transfer to a telemetry bed.    He will remain on dual antiplatelet therapy uninterrupted for at least 12 months.  He can be discharged home after hemodialysis, follow-up with Dr. Irish Lack.  For questions or updates, please contact [place] Please consult [email or web address] for contact info under        Signed, Quay Burow, MD  02/28/2020, 10:00 AM

## 2020-02-28 NOTE — Progress Notes (Signed)
CARDIAC REHAB PHASE I   Went to see pt, pt dressed and ready for d/c. Denies questions or concerns regarding MI education or medications. Referred to CRP II [place].   [id number] Rufina Falco, RN BSN 02/28/2020 1:43 PM

## 2020-02-28 NOTE — TOC Initial Note (Signed)
Transition of Care (TOC) - Initial/Assessment Note    Patient Details  Name: [name]. MRN: [id number] Date of Birth: 1970/03/08  Transition of [facility]) CM/SW Contact:    Zenon Mayo, RN Phone Number: 02/28/2020, 1:34 PM  Clinical Narrative:                 Patient is for dc home today, he will be on  Brilinta , TOC filled the 30 day free, copay is 197.00 patient can not afford this, per PA Harlan Stains, they will follow up with patient on his follow up apt about his cost of brilinta.   Expected Discharge Plan: Home/Self Care Barriers to Discharge: No Barriers Identified   Patient Goals and CMS Choice     Choice offered to / list presented to : NA  Expected Discharge Plan and Services Expected Discharge Plan: Home/Self Care   Discharge Planning Services: CM Consult   Living arrangements for the past 2 months: Single Family Home Expected Discharge Date: 02/28/20                 DME Agency: NA       HH Arranged: NA          Prior Living Arrangements/Services Living arrangements for the past 2 months: Single Family Home   Patient language and need for interpreter reviewed:: Yes Do you feel safe going back to the place where you live?: Yes      Need for Family Participation in Patient Care: No (Comment) Care giver support system in place?: No (comment)   Criminal Activity/Legal Involvement Pertinent to Current Situation/Hospitalization: No - Comment as needed  Activities of Daily Living      Permission Sought/Granted                  Emotional Assessment       Orientation: : Oriented to Self,Oriented to Place,Oriented to  Time,Oriented to Situation Alcohol / Substance Use: Not Applicable Psych Involvement: No (comment)  Admission diagnosis:  Atrial fibrillation ([place]) [I48.91] Patient Active Problem List   Diagnosis Date Noted  . Atrial fibrillation ([place]) 02/26/2020  . Acute anterior wall MI ([place])   . BPH (benign  prostatic hyperplasia) 11/07/2019  . Fluid overload, unspecified 08/15/2019  . Dependence on renal dialysis ([place]) 07/20/2019  . Hypertension secondary to other renal disorders 07/20/2019  . NVG (neovascular glaucoma), left, 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  . Symptomatic anemia 06/05/2019  . Moderate protein-calorie malnutrition ([place]) 05/14/2019  . Anaphylactic reaction due to adverse effect of correct drug or medicament properly administered, initial encounter 05/08/2019  . Anemia in chronic kidney disease 05/07/2019  . Chest pain, unspecified 05/07/2019  . Coagulation defect, unspecified ([place]) 05/07/2019  . Disorder of phosphorus metabolism, unspecified 05/07/2019  . Encounter for fitting and adjustment of extracorporeal dialysis catheter ([place]) 05/07/2019  . Gastroparesis 05/07/2019  . Iron deficiency anemia, unspecified 05/07/2019  . Major depressive disorder, recurrent, unspecified ([place]) 05/07/2019  . Pain, unspecified 05/07/2019  . Pruritus, unspecified 05/07/2019  . Dizziness 04/06/2019  . Metabolic acidosis 47/42/5956  . Syncope 04/06/2019  . Urinary retention 04/06/2019  . 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 Edema 11/28/2018  . Hypokalemia 11/28/2018  . Hypo-osmolality and hyponatremia 11/28/2018  . Proteinuria 11/28/2018  . Secondary hyperparathyroidism of renal origin ([place]) 11/28/2018  . Lymphedema 01/09/2018  . Chronic diastolic heart failure ([place]) 12/13/2017  . Vomiting 11/28/2017  .  Acute on chronic heart failure ([place]) 11/23/2017  . Type 2 diabetes mellitus with stage 4 chronic kidney disease, with long-term current use of insulin ([place]) 11/23/2017  . Diabetic retinopathy ([place]) 07/05/2017  . Uncontrolled diabetes mellitus ([place]) 07/08/2016  . Uncontrolled type 2 diabetes mellitus with hyperglycemia, with long-term current use of insulin ([place]) 04/20/2016  .  Osteomyelitis of toe of left foot ([place]) 03/15/2016  . Acute on chronic renal failure ([name]) 06/23/2015  . Diarrhea 06/23/2015  . Mixed hyperlipidemia   . Essential hypertension, benign   . Heart palpitations 05/06/2015   PCP:  [name], MD Pharmacy:   [place], [place] - [address] [place] [address] Phone: [phone] Fax: [phone]  [facility] [place], [place] - [address] [place] [address] Phone: [phone] Fax: [phone]     Social Determinants of Health (SDOH) Interventions    Readmission Risk Interventions Readmission Risk Prevention Plan 02/28/2020  Transportation Screening Complete  PCP or Specialist Appt within 3-5 Days Complete  HRI or [place] Complete  Social Work Consult for [name] Planning/Counseling Complete  Palliative Care Screening Not Applicable  Medication Review Press photographer) Complete  Some recent data might be hidden

## 2020-02-28 NOTE — Progress Notes (Signed)
D/C instructions given and reviewed. IV's removed, tolerated well.

## 2020-02-28 NOTE — Plan of Care (Signed)
  Problem: Education: Goal: Knowledge of General Education information will improve Description: Including pain rating scale, medication(s)/side effects and non-pharmacologic comfort measures Outcome: Adequate for Discharge   Problem: Health Behavior/Discharge Planning: Goal: Ability to manage health-related needs will improve Outcome: Adequate for Discharge   Problem: Clinical Measurements: Goal: Ability to maintain clinical measurements within normal limits will improve Outcome: Adequate for Discharge Goal: Will remain free from infection Outcome: Adequate for Discharge Goal: Diagnostic test results will improve Outcome: Adequate for Discharge Goal: Respiratory complications will improve Outcome: Adequate for Discharge Goal: Cardiovascular complication will be avoided Outcome: Adequate for Discharge   Problem: Activity: Goal: Risk for activity intolerance will decrease Outcome: Adequate for Discharge   Problem: Nutrition: Goal: Adequate nutrition will be maintained Outcome: Adequate for Discharge   Problem: Coping: Goal: Level of anxiety will decrease Outcome: Adequate for Discharge   Problem: Elimination: Goal: Will not experience complications related to bowel motility Outcome: Adequate for Discharge Goal: Will not experience complications related to urinary retention Outcome: Adequate for Discharge   Problem: Pain Managment: Goal: General experience of comfort will improve Outcome: Adequate for Discharge   Problem: Safety: Goal: Ability to remain free from injury will improve Outcome: Adequate for Discharge   Problem: Skin Integrity: Goal: Risk for impaired skin integrity will decrease Outcome: Adequate for Discharge   Problem: Education: Goal: Understanding of cardiac disease, CV risk reduction, and recovery process will improve Outcome: Adequate for Discharge Goal: Understanding of medication regimen will improve Outcome: Adequate for Discharge Goal:  Individualized Educational Video(s) Outcome: Adequate for Discharge   Problem: Activity: Goal: Ability to tolerate increased activity will improve Outcome: Adequate for Discharge   Problem: Cardiac: Goal: Ability to achieve and maintain adequate cardiopulmonary perfusion will improve Outcome: Adequate for Discharge Goal: Vascular access site(s) Level 0-1 will be maintained Outcome: Adequate for Discharge   Problem: Health Behavior/Discharge Planning: Goal: Ability to safely manage health-related needs after discharge will improve Outcome: Adequate for Discharge

## 2020-02-28 NOTE — Progress Notes (Signed)
   [facility] Progress Note   Assessment/ Plan:   1. CAD - Admitted as code STEMI , now s/p PCI and DES. On Asprin and Brilinta.  2.ESRD- K wnl this am. Cr 15.9 ,BUN 71 . Goal of 2.5 L UF. Weight down overnight?, likely weight is ~98 kg, dry weight 95.5. In HD this morning, tolerating well. Okay with discharge and resuming HD on normal schedule at his HD center.  3. 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  5. Nutrition: HH diet.  6. Hypertension: SBP 150's overnight, expect improvement with HD. Do not recommend treating with additional antihypertensives at this time.  Subjective:   No acute events overnight. No chest pain overnight.  In HD this morning and tolerating well.    Objective:   BP (!) 150/81 (BP Location: Left Arm)   Pulse 68   Temp 98.3 F (36.8 C) (Oral)   Resp 14   Ht 6\' 3"  (1.905 m)   Wt 97.8 kg   SpO2 99%   BMI 26.94 kg/m   Physical Exam: Gen: Middle age male, NAD , on HD CVS: RRR, no murmurs, rubs or gallops Resp: CTA anteriorly, normal work of breathing Ext: Cath site with clean bandage, no pain, no sign of bleeding  Labs: BMET Recent Labs  Lab 02/26/20 1032 02/27/20 0622 02/27/20 1407 02/28/20 0347  NA 139 136 136 136  K 4.3 4.7 4.9 4.8  CL 104 103 103 102  CO2 18* 17* 18* 17*  GLUCOSE 82 83 101* 79  BUN 54* 63* 66* 71*  CREATININE 11.52* 14.23* 14.71* 15.90*  CALCIUM 8.3* 8.1* 8.1* 8.0*  PHOS  --   --  8.7*  --    CBC Recent Labs  Lab 02/26/20 1032 02/27/20 0622 02/27/20 1407  WBC 7.4 8.5 8.6  NEUTROABS 5.1  --   --   HGB 10.9* 10.2* 10.1*  HCT 35.3* 31.6* 32.3*  MCV 80.6 78.2* 78.6*  PLT 197 197 182      Medications:    . aspirin  81 mg Oral Daily  . atorvastatin  80 mg Oral QHS  . carvedilol  6.25 mg Oral BID WC  . Chlorhexidine Gluconate Cloth  6 each Topical Daily  . Chlorhexidine Gluconate Cloth  6 each Topical Q0600  . dorzolamide-timolol  1 drop Left  Eye BID  . pantoprazole  40 mg Oral Daily  . prednisoLONE acetate  1 drop Left Eye Q4H  . sodium chloride flush  3 mL Intravenous Q12H  . tamsulosin  0.4 mg Oral QPC breakfast  . ticagrelor  90 mg Oral BID     Merry Proud Miku Udall,MD PGY2 02/28/2020, 6:08 AM   See attestation by attending for final recommendations.

## 2020-02-28 NOTE — Procedures (Signed)
Patient seen and examined on Hemodialysis. BP (!) 191/80   Pulse 78   Temp 98.6 F (37 C) (Oral)   Resp 12   Ht 6\' 3"  (1.905 m)   Wt 95.8 kg   SpO2 98%   BMI 26.40 kg/m   QB 400 mL/ min via R AVF, UF goal 3L  Tolerating treatment without complaints at this time.   Madelon Lips MD [facility] pgr [phone] 1:09 PM

## 2020-02-28 NOTE — Discharge Summary (Addendum)
Discharge Summary    Patient ID: [name]. MRN: [id number]; DOB: May 07, 1970  Admit date: 02/26/2020 Discharge date: 02/28/2020  Primary Care Provider: [name], MD  Primary Cardiologist: Larae Grooms, MD  Primary Electrophysiologist:  None   Discharge Diagnoses    Principal Problem:   Acute anterior wall MI [facility]) Active Problems:   Mixed hyperlipidemia   Essential hypertension, benign   Acute on chronic heart failure [facility])   Atrial fibrillation [facility])  Diagnostic Studies/Procedures    Cath: 02/26/20   The left ventricular systolic function is normal. LV end diastolic pressure is normal. The left ventricular ejection fraction is greater than 65% by visual estimate. There is no aortic valve stenosis. Mid LAD lesion is 90% stenosed. A drug-eluting stent was successfully placed using a STENT RESOLUTE ONYX 3.5X30, postdilated to > 4 mm. Stent optimized with IVUS. Post intervention, there is a 0% residual stenosis.   I suspect his abnormal ECG and anterior injury pattern was more from demand ischemia in the setting of hypotension and AFib with RVR, with severe mid LAD lesion.     During the cath, he converted to NSR briefly and his pulse pressure and BP returned to normal without any pressors. In addition, LVEDP only 11 mm Hg despite having 2.4 L NS administered on transport.  He may have been a little dehydrated after 3 L fluid removal after dialysis.    In regards to AFib.  Continue IV Amiodarone.  Will have to decide if he needs longterm anticoagulation along with antiplatelet therapy.  If so, would change Brilinta to clopidogrel.  Would have to check what options work in the setting of ESRD.  Diagnostic Dominance: Right    Intervention     _____________   History of Present Illness     [name]. is a 50 y.o. male with hx of uncontrolled DM, ESRD on HD (waiting organ transplant), HTN, HLD, gastroparesis, and diastolic dysfunction  transferred from [facility] with code STEMI.  Echo 06/2017 with LVEF of 75%, grade 1 DD and mild MR. Seen at [facility] 02/08/2020>>>pending evaluation and waiting organ transplant. Mr. Cheramie had sudden episode of hypotension with SOB in 60s and tachycardia at 120s during dialysis today. Per reports, he had completed > 3 hours of dialysis. ? Missed tx of saturday. No improvement in BP despite 900cc of fluids at dialysis center. Initial SBP was in 120s. EMS was called in at 9:30am. He was given another 500 cc by EMS enroute. No chest pain but he was diaphoretic and dizzy on ER arrival at [facility]. EKG showed STE in anterior leads with reciprocal change in inferior lateral leads >> code STEMI activated. He has given ASA and heparin bolus and plan made to transfer to [facility].    Up on arrival to cath lab pt was asymptomatic. No chest pain, dyspnea or palpitations. SBP in 100s on Levophad on 4 mcg/minutes. Denies prior hx of CAD, MI or stroke. No regular exercise. No tobacco or illicit drug use.     Hospital Course     Consultants: Nehprology  1. STEMI: recent present presentation with STEMI EKG cath revealing a high-grade mid LAD lesion. There was no other significant CAD.  He underwent PCI and drug-eluting stenting postdilated to 4 mm.  His LV function was normal.  He was placed on dual antiplatelet therapy including aspirin and Brilinta. hsTn peaked at 4308. Worked well with cardiac rehab without recurrent chest pain.    2. PAF: history of PAF  in the setting of dialysis.  He converted during the cardiac catheterization procedure with improvement in his hemodynamics.  He was put on IV amiodarone.  Given that this was in the setting of dialysis and he does not have otherwise documented PAF, Dr. Gwenlyn Found did not think he required long-term oral anticoagulation.  In addition, he had been on amiodarone for more than 24 hours without recurrent afib.  -- Will continue on coreg.    3. End-stage renal disease: on  hemodialysis Tuesday/Thursday/Saturday.  He apparently missed his Saturday hemodialysis session prior to admission.  Nephrology consulted to assist with HD needs while inpatient.   -- plan to resume normal HD sessions at discharge  4. Anemia 2/2 CKD:  stable at baseline  5. HTN: stable with home medication of Coreg 25mg  BID  6. HLD: on high statin, LDL 61  7. GERD: on prilosec  Did the patient have an acute coronary syndrome (MI, NSTEMI, STEMI, etc) this admission?:  Yes                               AHA/ACC Clinical Performance & Quality Measures: Aspirin prescribed? - Yes ADP Receptor Inhibitor (Plavix/Clopidogrel, Brilinta/Ticagrelor or Effient/Prasugrel) prescribed (includes medically managed patients)? - Yes Beta Blocker prescribed? - Yes High Intensity Statin (Lipitor 40-80mg  or Crestor 20-40mg ) prescribed? - Yes EF assessed during THIS hospitalization? - Yes For EF <40%, was ACEI/ARB prescribed? - Not Applicable (EF >/= 50%) For EF <40%, Aldosterone Antagonist (Spironolactone or Eplerenone) prescribed? - Not Applicable (EF >/= 53%) Cardiac Rehab Phase II ordered (including medically managed patients)? - Yes  _____________  Discharge Vitals Blood pressure (!) 157/73, pulse 78, temperature 98.6 F (37 C), temperature source Oral, resp. rate 12, height 6\' 3"  (1.905 m), weight 95.8 kg, SpO2 98 %.  Filed Weights   02/28/20 0325 02/28/20 0749 02/28/20 1150  Weight: 97.8 kg 98.3 kg 95.8 kg    Labs & Radiologic Studies    CBC Recent Labs    02/26/20 1032 02/27/20 0622 02/27/20 1407 02/28/20 0744  WBC 7.4   < > 8.6 9.6  NEUTROABS 5.1  --   --   --   HGB 10.9*   < > 10.1* 9.8*  HCT 35.3*   < > 32.3* 32.1*  MCV 80.6   < > 78.6* 80.3  PLT 197   < > 182 175   < > = values in this interval not displayed.   Basic Metabolic Panel Recent Labs    02/27/20 1407 02/28/20 0347  NA 136 136  K 4.9 4.8  CL 103 102  CO2 18* 17*  GLUCOSE 101* 79  BUN 66* 71*  CREATININE  14.71* 15.90*  CALCIUM 8.1* 8.0*  PHOS 8.7*  --    Liver Function Tests Recent Labs    02/26/20 1032 02/27/20 1407  AST 8*  --   ALT 7  --   ALKPHOS 70  --   BILITOT 1.1  --   PROT 6.4*  --   ALBUMIN 3.4* 3.2*   No results for input(s): LIPASE, AMYLASE in the last 72 hours. High Sensitivity Troponin:   Recent Labs  Lab 02/26/20 1032 02/27/20 0622  TROPONINIHS 18* 4,308*    BNP Invalid input(s): POCBNP D-Dimer No results for input(s): DDIMER in the last 72 hours. Hemoglobin A1C Recent Labs    02/27/20 0622  HGBA1C 5.5   Fasting Lipid Panel Recent Labs    02/27/20 0622  CHOL 119  HDL 38*  LDLCALC 65  TRIG 78  CHOLHDL 3.1   Thyroid Function Tests No results for input(s): TSH, T4TOTAL, T3FREE, THYROIDAB in the last 72 hours.  Invalid input(s): FREET3 _____________  CARDIAC CATHETERIZATION  Result Date: 02/26/2020  The left ventricular systolic function is normal.  LV end diastolic pressure is normal.  The left ventricular ejection fraction is greater than 65% by visual estimate.  There is no aortic valve stenosis.  Mid LAD lesion is 90% stenosed.  A drug-eluting stent was successfully placed using a STENT RESOLUTE ONYX 3.5X30, postdilated to > 4 mm. Stent optimized with IVUS.  Post intervention, there is a 0% residual stenosis.  I suspect his abnormal ECG and anterior injury pattern was more from demand ischemia in the setting of hypotension and AFib with RVR, with severe mid LAD lesion.  During the cath, he converted to NSR briefly and his pulse pressure and BP returned to normal without any pressors. In addition, LVEDP only 11 mm Hg despite having 2.4 L NS administered on transport.  He may have been a little dehydrated after 3 L fluid removal after dialysis. In regards to AFib.  Continue IV Amiodarone.  Will have to decide if he needs longterm anticoagulation along with antiplatelet therapy.  If so, would change Brilinta to clopidogrel.  Would have to check  what options work in the setting of ESRD.   DG Chest Port 1 View  Result Date: 02/26/2020 CLINICAL DATA:  Code STEMI. EXAM: PORTABLE CHEST 1 VIEW COMPARISON:  06/04/2019 FINDINGS: Cardiac enlargement with normal pulmonary vascularity. Negative for heart failure, edema, fusion. Mild right lower lobe atelectasis. Central line is been removed since the prior study. IMPRESSION: Mild right lower lobe atelectasis. Left lung clear. Negative for congestive heart failure. Electronically Signed   By: Franchot Gallo M.D.   On: 02/26/2020 10:56   VAS US DUPLEX DIALYSIS ACCESS (AVF, AVG)  Result Date: 02/26/2020 DIALYSIS ACCESS Reason for Exam: Routine follow up. Access Site: Right Upper Extremity. Access Type: Brachial-cephalic AVF. History: Created 05/31/2019. Comparison Study: 07/17/2019 Performing Technologist: Concha Norway RVT  Examination Guidelines: A complete evaluation includes B-mode imaging, spectral Doppler, color Doppler, and power Doppler as needed of all accessible portions of each vessel. Unilateral testing is considered an integral part of a complete examination. Limited examinations for reoccurring indications may be performed as noted.  Findings: +--------------------+----------+-----------------+--------+ AVF                 PSV (cm/s)Flow Vol (mL/min)Comments +--------------------+----------+-----------------+--------+ Native artery inflow   464          2641                +--------------------+----------+-----------------+--------+ AVF Anastomosis        645                              +--------------------+----------+-----------------+--------+  +---------------+----------+-------------+----------+--------+ OUTFLOW VEIN   PSV (cm/s)Diameter (cm)Depth (cm)Describe +---------------+----------+-------------+----------+--------+ Subclavian vein   209                                    +---------------+----------+-------------+----------+--------+ Confluence        389                                     +---------------+----------+-------------+----------+--------+  Prox UA           130                                    +---------------+----------+-------------+----------+--------+ Mid UA            126                                    +---------------+----------+-------------+----------+--------+ Dist UA           346        1.16                        +---------------+----------+-------------+----------+--------+  +----------------+-------------+----------+---------+----------+---------------+                 Diameter (cm)Depth (cm)BranchingPSV (cm/s)  Flow Volume                                                                (ml/min)     +----------------+-------------+----------+---------+----------+---------------+ Right Rad Art                                       31                    Dis                                                                       +----------------+-------------+----------+---------+----------+---------------+ Antegrade                                                                 +----------------+-------------+----------+---------+----------+---------------+  Summary: Patent right brachiocephalic AVF.  *See table(s) above for measurements and observations.  Diagnosing physician: [name] MD Electronically signed by [name] MD on 02/26/2020 at 8:46:27 AM.    --------------------------------------------------------------------------------   Final    Disposition   Pt is being discharged home today in good condition.  Follow-up Plans & Appointments     Follow-up Information     Tommie Raymond, NP Follow up on 03/13/2020.   Specialty: Cardiology Why: at 9:15am for your follow up appt Contact information: [place] [address] [phone]                Discharge Instructions     Amb Referral to Cardiac Rehabilitation   Complete by: As  directed    Diagnosis:  Coronary Stents STEMI     After initial evaluation and assessments completed: Virtual Based Care may be provided alone or in conjunction with Phase  2 Cardiac Rehab based on patient barriers.: Yes   Diet - low sodium heart healthy   Complete by: As directed    Discharge instructions   Complete by: As directed    Groin Site Care Refer to this sheet in the next few weeks. These instructions provide you with information on caring for yourself after your procedure. Your caregiver may also give you more specific instructions. Your treatment has been planned according to current medical practices, but problems sometimes occur. Call your caregiver if you have any problems or questions after your procedure. HOME CARE INSTRUCTIONS You may shower 24 hours after the procedure. Remove the bandage (dressing) and gently wash the site with plain soap and water. Gently pat the site dry.  Do not apply powder or lotion to the site.  Do not sit in a bathtub, swimming pool, or whirlpool for 5 to 7 days.  No bending, squatting, or lifting anything over 10 pounds (4.5 kg) as directed by your caregiver.  Inspect the site at least twice daily.  Do not drive home if you are discharged the same day of the procedure. Have someone else drive you.  You may drive 24 hours after the procedure unless otherwise instructed by your caregiver.  What to expect: Any bruising will usually fade within 1 to 2 weeks.  Blood that collects in the tissue (hematoma) may be painful to the touch. It should usually decrease in size and tenderness within 1 to 2 weeks.  SEEK IMMEDIATE MEDICAL CARE IF: You have unusual pain at the groin site or down the affected leg.  You have redness, warmth, swelling, or pain at the groin site.  You have drainage (other than a small amount of blood on the dressing).  You have chills.  You have a fever or persistent symptoms for more than 72 hours.  You have a fever and your  symptoms suddenly get worse.  Your leg becomes pale, cool, tingly, or numb.  You have heavy bleeding from the site. Hold pressure on the site. Marland Kitchen  PLEASE DO NOT MISS ANY DOSES OF YOUR BRILINTA!!!!! Also keep a log of you blood pressures and bring back to your follow up appt. Please call the office with any questions.   Patients taking blood thinners should generally stay away from medicines like ibuprofen, Advil, Motrin, naproxen, and Aleve due to risk of stomach bleeding. You may take Tylenol as directed or talk to your primary doctor about alternatives.  PLEASE ENSURE THAT YOU DO NOT RUN OUT OF YOUR BRILINTA. This medication is very important to remain on for at least one year. IF you have issues obtaining this medication due to cost please CALL the office 3-5 business days prior to running out in order to prevent missing doses of this medication.   Increase activity slowly   Complete by: As directed    No wound care   Complete by: As directed        Discharge Medications   Allergies as of 02/28/2020       Reactions   Levofloxacin Swelling   Of face   Promethazine Diarrhea, Other (See Comments)   Other reaction(s): Other (See Comments), Other (See Comments) Other reaction(s): Muscle cramps Muscle cramps Cramping Other reaction(s): Muscle cramps Muscle cramps   Malt Other (See Comments)   Other reaction(s): cramping        Medication List     STOP taking these medications    apixaban 5 MG Tabs tablet Commonly known as:  ELIQUIS       TAKE these medications    aspirin 81 MG chewable tablet Chew 1 tablet (81 mg total) by mouth daily.   atorvastatin 80 MG tablet Commonly known as: LIPITOR Take 1 tablet (80 mg total) by mouth at bedtime.   carvedilol 25 MG tablet Commonly known as: COREG Take 25 mg by mouth every morning.   dorzolamide-timolol 22.3-6.8 MG/ML ophthalmic solution Commonly known as: COSOPT Place 1 drop into the left eye 2 (two) times daily.    lidocaine-prilocaine cream Commonly known as: EMLA Apply 1 application topically Every Tuesday,Thursday,and Saturday with dialysis.   nitroGLYCERIN 0.4 MG SL tablet Commonly known as: Nitrostat Place 1 tablet (0.4 mg total) under the tongue every 5 (five) minutes as needed.   omeprazole 40 MG capsule Commonly known as: PRILOSEC Take 80 mg by mouth 2 (two) times a day.   prednisoLONE acetate 1 % ophthalmic suspension Commonly known as: PRED FORTE Place 1 drop into the left eye every 4 (four) hours.   tamsulosin 0.4 MG Caps capsule Commonly known as: FLOMAX Take 0.4 mg by mouth daily after breakfast.   ticagrelor 90 MG Tabs tablet Commonly known as: BRILINTA Take 1 tablet (90 mg total) by mouth 2 (two) times daily.        Outstanding Labs/Studies   FLP/LFTs in 8 weeks  Duration of Discharge Encounter   Greater than 30 minutes including physician time.  Signed, Reino Bellis, NP 02/28/2020, 12:21 PM   Agree with note by Reino Bellis NP-C  S/P mid LAD PCI/DES in setting of ST elevation and transient AFIB. On DAPT. No CP. OK to DC home after HD. TOC 7 then ROV with Dr Irish Lack.  Lorretta Harp, M.D., [place], [facility], [place] [address] [place], [place]  [address]  [phone] 02/28/2020 12:57 PM

## 2020-03-09 NOTE — Progress Notes (Signed)
Cardiology Office Note   Date:  03/13/2020   ID:  [name] Cooper., DOB [birth year], MRN [id number]  PCP:  [name] Schwalbe, MD  Cardiologist: Dr. Irish Lack, MD    Chief Complaint  Patient presents with  . Follow-up  . Hospitalization Follow-up    History of Present Illness: [name] Mickal Meno. is a 50 y.o. male who presents for hospital follow up, seen for [name].   [name] has a hx of uncontrolled DM, ESRD on HD (waiting organ transplant), HTN, HLD, gastroparesis, and diastolic dysfunction who was seen by our team after transfer from [facility] with code STEMI.   He underwent an echo 06/2017 that showed an LVEF of 75%, grade 1 DD and mild MR. He has been recently followed at [facility] for organ transplant (nephrology). Prior to his admission, he was at HD when he developed a sudden episode of hypotension with SOB in 60s and tachycardia at 120s. Per reports, he had completed > 3 hours of dialysis.There was no improvement in BP despite 900cc of fluids at dialysis center. On EMS arrival, EKG showed STE in anterior leads with reciprocal change in inferior lateral leads therefore a code STEMI activated. He was given ASA and heparin bolus and was transfered to [facility] for further evaluation.   LHC showed a high-grade mid LAD lesion. There was no other significant CAD. He underwent PCI/DES to mLAD. His LV function was normal. He was placed on dual antiplatelet therapy including aspirin and Brilinta. hsTn peaked at 4308. He had an epsiode of atrial fibrillation and was placed on IV Amiodarone. He converted to NSR during the cath with no recurrent AF during his hospital course. He has no other documented PAF and [name] did not feel that he would require long-term oral anticoagulation.   Today he presents for post LHC follow up with his wife and states that he has been doing very well since hospital discharge. He denies chest pain, SOB, palpitations, PND or LE edema. He reports that [name]  was very expensive for him therefore we will plan for transition to Plavix with a load on 03/31/20. He is asking about resuming his walking which will be fine. He seems interested in cardiac rehab. His BP has been well controlled and HLD looks great. In the past, he was treated for uncontrolled DM with a HbA1c at 17.0>>treated with gastroparesis which reversed his DM but cost him his kidneys. He is currently being worked up for a kidney transplant to be supplied by his wife. Once on DAPT without interruption for at least a year, will then be able to stop Plavix and continue ASA however will need to do this under the direction of [name].   Past Medical History:  Diagnosis Date  . Anxiety   . Cellulitis   . CHF (congestive heart failure) ([place])   . Chronic diarrhea   . Chronic kidney disease   . Chronic pain of both ankles   . Diabetes mellitus without complication ([place])   . Diabetes mellitus, type II ([place])   . Diabetic neuropathy ([place])   . Frequent falls   . Gait instability   . GERD (gastroesophageal reflux disease)   . Heart palpitations   . History of kidney stones   . HLD (hyperlipidemia)   . Hypertension   . Insomnia   . Nausea and vomiting in adult    recurrent    Past Surgical History:  Procedure Laterality Date  . AMPUTATION Left 03/16/2016  Procedure: AMPUTATION DIGIT LEFT HALLUX;  Surgeon: Trula Slade, DPM;  Location: [place];  Service: Podiatry;  Laterality: Left;  can start around 5   . ARTHROSCOPIC REPAIR ACL Left   . AV FISTULA PLACEMENT Right 05/31/2019   Procedure: Brachiocephalic AV fistula creation;  Surgeon: Algernon Huxley, MD;  Location: [name] ORS;  Service: Vascular;  Laterality: Right;  . COLONOSCOPY WITH PROPOFOL N/A 10/28/2015   Procedure: COLONOSCOPY WITH PROPOFOL;  Surgeon: Lollie Sails, MD;  Location: [facility] ENDOSCOPY;  Service: Endoscopy;  Laterality: N/A;  . COLONOSCOPY WITH PROPOFOL N/A 10/29/2015   Procedure: COLONOSCOPY WITH PROPOFOL;  Surgeon:  Lollie Sails, MD;  Location: [facility] ENDOSCOPY;  Service: Endoscopy;  Laterality: N/A;  . CORONARY/GRAFT ACUTE MI REVASCULARIZATION N/A 02/26/2020   Procedure: Coronary/Graft Acute MI Revascularization;  Surgeon: Jettie Booze, MD;  Location: [place] CV LAB;  Service: Cardiovascular;  Laterality: N/A;  . DIALYSIS/PERMA CATHETER INSERTION Right 04/26/2019   Perm Cath   . DIALYSIS/PERMA CATHETER INSERTION N/A 04/26/2019   Procedure: DIALYSIS/PERMA CATHETER INSERTION;  Surgeon: Algernon Huxley, MD;  Location: [place] CV LAB;  Service: Cardiovascular;  Laterality: N/A;  . DIALYSIS/PERMA CATHETER REMOVAL N/A 08/15/2019   Procedure: DIALYSIS/PERMA CATHETER REMOVAL;  Surgeon: Katha Cabal, MD;  Location: [place] CV LAB;  Service: Cardiovascular;  Laterality: N/A;  . ESOPHAGOGASTRODUODENOSCOPY (EGD) WITH PROPOFOL N/A 12/27/2017   Procedure: ESOPHAGOGASTRODUODENOSCOPY (EGD) WITH PROPOFOL;  Surgeon: Toledo, Benay Pike, MD;  Location: [name] ENDOSCOPY;  Service: Gastroenterology;  Laterality: N/A;  . INTRAVASCULAR ULTRASOUND/IVUS N/A 02/26/2020   Procedure: Intravascular Ultrasound/IVUS;  Surgeon: Jettie Booze, MD;  Location: [place] CV LAB;  Service: Cardiovascular;  Laterality: N/A;  . LEFT HEART CATH AND CORONARY ANGIOGRAPHY N/A 02/26/2020   Procedure: LEFT HEART CATH AND CORONARY ANGIOGRAPHY;  Surgeon: Jettie Booze, MD;  Location: [place] CV LAB;  Service: Cardiovascular;  Laterality: N/A;  . PROSTATE SURGERY  2016  . TONSILECTOMY/ADENOIDECTOMY WITH MYRINGOTOMY    . TONSILLECTOMY       Current Outpatient Medications  Medication Sig Dispense Refill  . aspirin 81 MG chewable tablet Chew 1 tablet (81 mg total) by mouth daily. 90 tablet 1  . atorvastatin (LIPITOR) 80 MG tablet Take 1 tablet (80 mg total) by mouth at bedtime. 90 tablet 0  . carvedilol (COREG) 25 MG tablet Take 25 mg by mouth every morning.    . clopidogrel (PLAVIX) 75 MG tablet START PLAVIX AND  TAKE 300 MG ON 2/7, THEN ON 2/8 TAKE PLAVIX 75 MG DAILY. 90 tablet 3  . dorzolamide-timolol (COSOPT) 22.3-6.8 MG/ML ophthalmic solution Place 1 drop into the left eye 2 (two) times daily.    Marland Kitchen lidocaine-prilocaine (EMLA) cream Apply 1 application topically Every Tuesday,Thursday,and Saturday with dialysis.     Marland Kitchen nitroGLYCERIN (NITROSTAT) 0.4 MG SL tablet Place 1 tablet (0.4 mg total) under the tongue every 5 (five) minutes as needed. 25 tablet 2  . omeprazole (PRILOSEC) 40 MG capsule Take 80 mg by mouth 2 (two) times a day.     . prednisoLONE acetate (PRED FORTE) 1 % ophthalmic suspension Place 1 drop into the left eye every 4 (four) hours.    . tamsulosin (FLOMAX) 0.4 MG CAPS capsule Take 0.4 mg by mouth daily after breakfast.     . ticagrelor (BRILINTA) 90 MG TABS tablet Take 1 tablet (90 mg total) by mouth 2 (two) times daily. 180 tablet 2   No current facility-administered medications for this visit.  Allergies:   Levofloxacin, Promethazine, and Malt    Social History:  The patient  reports that he has never smoked. He has never used smokeless tobacco. He reports that he does not drink alcohol and does not use drugs.   Family History:  The patient's family history includes CAD in his father; Diabetes Mellitus II in his mother; Kidney failure in his mother; Schizophrenia in his mother; Stroke in his father.    ROS:  Please see the history of present illness.   Otherwise, review of systems are positive for none. All other systems are reviewed and negative.    PHYSICAL EXAM: VS:  BP 136/68   Pulse 68   Ht 6\' 3"  (1.905 m)   Wt 210 lb 12.8 oz (95.6 kg)   SpO2 99%   BMI 26.35 kg/m  , BMI Body mass index is 26.35 kg/m.   General: Well developed, well nourished, NAD Skin: Warm, dry, intact  Lungs:Clear to ausculation bilaterally. No wheezes, rales, or rhonchi. Breathing is unlabored. Cardiovascular: RRR with S1 S2. No murmurs Extremities: No edema.  Neuro: Alert and oriented. No  focal deficits. No facial asymmetry. MAE spontaneously. Psych: Responds to questions appropriately with normal affect.    EKG:  EKG is not ordered today.   Recent Labs: 04/25/2019: TSH 2.14 02/26/2020: ALT 7 02/28/2020: BUN 71; Creatinine, Ser 15.90; Hemoglobin 9.8; Platelets 175; Potassium 4.8; Sodium 136    Lipid Panel    Component Value Date/Time   CHOL 119 02/27/2020 0622   TRIG 78 02/27/2020 0622   HDL 38 (L) 02/27/2020 0622   CHOLHDL 3.1 02/27/2020 0622   VLDL 16 02/27/2020 0622   LDLCALC 65 02/27/2020 0622   LDLCALC 102 (H) 04/25/2019 1159     Wt Readings from Last 3 Encounters:  03/13/20 210 lb 12.8 oz (95.6 kg)  02/28/20 211 lb 3.2 oz (95.8 kg)  02/19/20 212 lb (96.2 kg)      Other studies Reviewed: Additional studies/ records that were reviewed today include:  Review of the above records demonstrates:   Cath: 02/26/20   The left ventricular systolic function is normal.  LV end diastolic pressure is normal.  The left ventricular ejection fraction is greater than 65% by visual estimate.  There is no aortic valve stenosis.  Mid LAD lesion is 90% stenosed.  A drug-eluting stent was successfully placed using a STENT RESOLUTE ONYX 3.5X30, postdilated to > 4 mm. Stent optimized with IVUS.  Post intervention, there is a 0% residual stenosis.    Diagnostic Dominance: Right    Intervention     ASSESSMENT AND PLAN:  1. CAD s/p anterior STEMI:  -Denies chest pain and has been tolerating DAPT well with no s/s of bleeding in stool or urine. He is asing about increasing his activity which will be fine and increase as tolerated. He seems interestedin pursuing cardiac rehab although this may be a difficult to schedule with his HD schedule on TTHSat.  -Reports that Kary Kos is cost prohibitive therefore he will continue until his bottle is out (should be 03/30/20 based on d/c date). He will then load with Plavix on 03/31/20 and start Plavix 75mg  QD on 04/01/20. I  have reviewed this with the patient and wife.  -Continue beta blocker, statin   2. PAF:  -Denies palpitations -Based on no recurrent AF on telemetry during his hospital course and no symptoms, will not start AC at this time -Continue on carvedilol   3. End-stage renal disease:  -On HD TTHSat -  Currently being worked up for renal transplant to be supplied by his wife. We discussed the need to stay on uninterrupted DAPT for at least a year. We will be in communication with his transplant team during this process.  4. HTN:  -Stable, continue current regimen   5. HLD:  -Stable, continue high intensity statin   6. GERD:  -Will change to Protonix due to interaction with Plavix>>personally spoke to patient about this     Current medicines are reviewed at length with the patient today.  The patient does not have concerns regarding medicines.  The following changes have been made:  Change Brilinta to Plavix with load on 03/31/20. Also change Prilosec to Protonix   Labs/ tests ordered today include: none No orders of the defined types were placed in this encounter.    Disposition:   FU with myself or [name] in 6 weeks  Signed, Kathyrn Drown, NP  03/13/2020 10:43 AM    [address], [place], [place]  [address] Phone: [phone]; Fax: [phone]

## 2020-03-13 ENCOUNTER — Other Ambulatory Visit: Payer: Self-pay

## 2020-03-13 ENCOUNTER — Ambulatory Visit: Payer: Medicare (Managed Care) | Admitting: Cardiology

## 2020-03-13 ENCOUNTER — Encounter: Payer: Self-pay | Admitting: Cardiology

## 2020-03-13 ENCOUNTER — Other Ambulatory Visit: Payer: Self-pay | Admitting: Cardiology

## 2020-03-13 VITALS — BP 136/68 | HR 68 | Ht 75.0 in | Wt 210.8 lb

## 2020-03-13 DIAGNOSIS — N186 End stage renal disease: Secondary | ICD-10-CM | POA: Diagnosis not present

## 2020-03-13 DIAGNOSIS — I151 Hypertension secondary to other renal disorders: Secondary | ICD-10-CM | POA: Diagnosis not present

## 2020-03-13 DIAGNOSIS — I10 Essential (primary) hypertension: Secondary | ICD-10-CM | POA: Diagnosis not present

## 2020-03-13 DIAGNOSIS — I2109 ST elevation (STEMI) myocardial infarction involving other coronary artery of anterior wall: Secondary | ICD-10-CM

## 2020-03-13 DIAGNOSIS — N2889 Other specified disorders of kidney and ureter: Secondary | ICD-10-CM

## 2020-03-13 MED ORDER — PANTOPRAZOLE SODIUM 40 MG PO TBEC: 40.0000 mg | DELAYED_RELEASE_TABLET | Freq: Every day | ORAL | 3 refills | Status: DC

## 2020-03-13 MED ORDER — CLOPIDOGREL BISULFATE 75 MG PO TABS: ORAL_TABLET | ORAL | 3 refills | Status: DC

## 2020-03-13 NOTE — Patient Instructions (Addendum)
Medication Instructions:  Your physician has recommended you make the following change in your medication:  1. STOP BRILINTA ON 2/6  2. START PLAVIX AND TAKE 300 MG ON 2/7, THEN ON 2/8 TAKE PLAVIX 75 MG DAILY.  *If you need a refill on your cardiac medications before your next appointment, please call your pharmacy*   Lab Work: NONE If you have labs (blood work) drawn today and your tests are completely normal, you will receive your results only by: Marland Kitchen MyChart Message (if you have MyChart) OR . A paper copy in the mail If you have any lab test that is abnormal or we need to change your treatment, we will call you to review the results.   Testing/Procedures: NONE   Follow-Up: At [facility], you and your health needs are our priority.  As part of our continuing mission to provide you with exceptional heart care, we have created designated Provider Care Teams.  These Care Teams include your primary Cardiologist (physician) and Advanced Practice Providers (APPs -  Physician Assistants and Nurse Practitioners) who all work together to provide you with the care you need, when you need it.  We recommend signing up for the patient portal called "MyChart".  Sign up information is provided on this After Visit Summary.  MyChart is used to connect with patients for Virtual Visits (Telemedicine).  Patients are able to view lab/test results, encounter notes, upcoming appointments, etc.  Non-urgent messages can be sent to your provider as well.   To learn more about what you can do with MyChart, go to [email or web address].    Your next appointment:   6-8 week(s)  The format for your next appointment:   In Person  Provider:   Casandra Doffing, MD or Kathyrn Drown, NP

## 2020-04-17 DIAGNOSIS — H25811 Combined forms of age-related cataract, right eye: Secondary | ICD-10-CM | POA: Insufficient documentation

## 2020-04-24 NOTE — Progress Notes (Deleted)
Cardiology Office Note   Date:  04/24/2020   ID:  [name] Cooper., DOB 1970-12-22, MRN [id number]  PCP:  [name], MD    No chief complaint on file.  CAD  Wt Readings from Last 3 Encounters:  03/13/20 210 lb 12.8 oz (95.6 kg)  02/28/20 211 lb 3.2 oz (95.8 kg)  02/19/20 212 lb (96.2 kg)       History of Present Illness: [name] Helton Bonser. is a 49 y.o. male  Who had an anterior MI in the setting of AFib and hypotension in Jan 2022.   [name] a hx of uncontrolled DM, ESRD on HD (waiting organ transplant), HTN, HLD, gastroparesis, and diastolic dysfunction.  He underwent an echo 06/2017 that showed an LVEF of 75%, grade 1 DD and mild MR. He Macdonald been recently followed at [facility] for organ transplant (nephrology). Prior to his admission, he was at HD when he developed a sudden episode of hypotension with SOB in 60s and tachycardia at 120s. Per reports, he had completed > 3 hours of dialysis.There was no improvement in BP despite 900cc of fluids at dialysis center. On EMS arrival, EKG showed STE in anterior leads with reciprocal change in inferior lateral leads therefore a code STEMI activated.   Jan 2022 Cath showed:  "The left ventricular systolic function is normal.  LV end diastolic pressure is normal.  The left ventricular ejection fraction is greater than 65% by visual estimate.  There is no aortic valve stenosis.  Mid LAD lesion is 90% stenosed.  A drug-eluting stent was successfully placed using a STENT RESOLUTE ONYX 3.5X30, postdilated to > 4 mm. Stent optimized with IVUS.  Post intervention, there is a 0% residual stenosis.   I suspect his abnormal ECG and anterior injury pattern was more from demand ischemia in the setting of hypotension and AFib with RVR, with severe mid LAD lesion.    During the cath, he converted to NSR briefly and his pulse pressure and BP returned to normal without any pressors. In addition, LVEDP only 11 mm Hg despite having  2.4 L NS administered on transport.  He may have been a little dehydrated after 3 L fluid removal after dialysis.   In regards to AFib.  Continue IV Amiodarone.  Will have to decide if he needs longterm anticoagulation along with antiplatelet therapy.  If so, would change Brilinta to clopidogrel.  Would have to check what options work in the setting of ESRD."   Transitioned to Plavix due to cost in 2/22.   Past Medical History:  Diagnosis Date  . Anxiety   . Cellulitis   . CHF (congestive heart failure) ([place])   . Chronic diarrhea   . Chronic kidney disease   . Chronic pain of both ankles   . Diabetes mellitus without complication ([place])   . Diabetes mellitus, type II ([place])   . Diabetic neuropathy ([place])   . Frequent falls   . Gait instability   . GERD (gastroesophageal reflux disease)   . Heart palpitations   . History of kidney stones   . HLD (hyperlipidemia)   . Hypertension   . Insomnia   . Nausea and vomiting in adult    recurrent    Past Surgical History:  Procedure Laterality Date  . AMPUTATION Left 03/16/2016   Procedure: AMPUTATION DIGIT LEFT HALLUX;  Surgeon: Trula Slade, DPM;  Location: [place];  Service: Podiatry;  Laterality: Left;  can start around 5   .  ARTHROSCOPIC REPAIR ACL Left   . AV FISTULA PLACEMENT Right 05/31/2019   Procedure: Brachiocephalic AV fistula creation;  Surgeon: Algernon Huxley, MD;  Location: [name] ORS;  Service: Vascular;  Laterality: Right;  . COLONOSCOPY WITH PROPOFOL N/A 10/28/2015   Procedure: COLONOSCOPY WITH PROPOFOL;  Surgeon: Lollie Sails, MD;  Location[facility] ENDOSCOPY;  Service: Endoscopy;  Laterality: N/A;  . COLONOSCOPY WITH PROPOFOL N/A 10/29/2015   Procedure: COLONOSCOPY WITH PROPOFOL;  Surgeon: Lollie Sails, MD;  Location: [facility] ENDOSCOPY;  Service: Endoscopy;  Laterality: N/A;  . CORONARY/GRAFT ACUTE MI REVASCULARIZATION N/A 02/26/2020   Procedure: Coronary/Graft Acute MI Revascularization;  Surgeon: Jettie Booze, MD;   Location: [place] CV LAB;  Service: Cardiovascular;  Laterality: N/A;  . DIALYSIS/PERMA CATHETER INSERTION Right 04/26/2019   Perm Cath   . DIALYSIS/PERMA CATHETER INSERTION N/A 04/26/2019   Procedure: DIALYSIS/PERMA CATHETER INSERTION;  Surgeon: Algernon Huxley, MD;  Location: [place] CV LAB;  Service: Cardiovascular;  Laterality: N/A;  . DIALYSIS/PERMA CATHETER REMOVAL N/A 08/15/2019   Procedure: DIALYSIS/PERMA CATHETER REMOVAL;  Surgeon: Katha Cabal, MD;  Location: [place] CV LAB;  Service: Cardiovascular;  Laterality: N/A;  . ESOPHAGOGASTRODUODENOSCOPY (EGD) WITH PROPOFOL N/A 12/27/2017   Procedure: ESOPHAGOGASTRODUODENOSCOPY (EGD) WITH PROPOFOL;  Surgeon: Toledo, Benay Pike, MD;  Location: [name] ENDOSCOPY;  Service: Gastroenterology;  Laterality: N/A;  . INTRAVASCULAR ULTRASOUND/IVUS N/A 02/26/2020   Procedure: Intravascular Ultrasound/IVUS;  Surgeon: Jettie Booze, MD;  Location: [place] CV LAB;  Service: Cardiovascular;  Laterality: N/A;  . LEFT HEART CATH AND CORONARY ANGIOGRAPHY N/A 02/26/2020   Procedure: LEFT HEART CATH AND CORONARY ANGIOGRAPHY;  Surgeon: Jettie Booze, MD;  Location: [place] CV LAB;  Service: Cardiovascular;  Laterality: N/A;  . PROSTATE SURGERY  2016  . TONSILECTOMY/ADENOIDECTOMY WITH MYRINGOTOMY    . TONSILLECTOMY       Current Outpatient Medications  Medication Sig Dispense Refill  . pantoprazole (PROTONIX) 40 MG tablet Take 1 tablet (40 mg total) by mouth daily. 90 tablet 3  . aspirin 81 MG chewable tablet Chew 1 tablet (81 mg total) by mouth daily. 90 tablet 1  . atorvastatin (LIPITOR) 80 MG tablet Take 1 tablet (80 mg total) by mouth at bedtime. 90 tablet 0  . carvedilol (COREG) 25 MG tablet Take 25 mg by mouth every morning.    . clopidogrel (PLAVIX) 75 MG tablet START PLAVIX AND TAKE 300 MG ON 2/7, THEN ON 2/8 TAKE PLAVIX 75 MG DAILY. 90 tablet 3  . dorzolamide-timolol (COSOPT) 22.3-6.8 MG/ML ophthalmic solution Place 1  drop into the left eye 2 (two) times daily.    Marland Kitchen lidocaine-prilocaine (EMLA) cream Apply 1 application topically Every Tuesday,Thursday,and Saturday with dialysis.     Marland Kitchen nitroGLYCERIN (NITROSTAT) 0.4 MG SL tablet Place 1 tablet (0.4 mg total) under the tongue every 5 (five) minutes as needed. 25 tablet 2  . prednisoLONE acetate (PRED FORTE) 1 % ophthalmic suspension Place 1 drop into the left eye every 4 (four) hours.    . tamsulosin (FLOMAX) 0.4 MG CAPS capsule Take 0.4 mg by mouth daily after breakfast.     . ticagrelor (BRILINTA) 90 MG TABS tablet Take 1 tablet (90 mg total) by mouth 2 (two) times daily. 180 tablet 2   No current facility-administered medications for this visit.    Allergies:   Levofloxacin, Promethazine, and Malt    Social History:  The patient  reports that he Macdonald never smoked. He Macdonald never used smokeless tobacco. He reports  that he does not drink alcohol and does not use drugs.   Family History:  The patient's ***family history includes CAD in his father; Diabetes Mellitus II in his mother; Kidney failure in his mother; Schizophrenia in his mother; Stroke in his father.    ROS:  Please see the history of present illness.   Otherwise, review of systems are positive for ***.   All other systems are reviewed and negative.    PHYSICAL EXAM: VS:  There were no vitals taken for this visit. , BMI There is no height or weight on file to calculate BMI. GEN: Well nourished, well developed, in no acute distress  HEENT: normal  Neck: no JVD, carotid bruits, or masses Cardiac: ***RRR; no murmurs, rubs, or gallops,no edema  Respiratory:  clear to auscultation bilaterally, normal work of breathing GI: soft, nontender, nondistended, + BS MS: no deformity or atrophy  Skin: warm and dry, no rash Neuro:  Strength and sensation are intact Psych: euthymic mood, full affect   EKG:   The ekg ordered today demonstrates ***   Recent Labs: 02/26/2020: ALT 7 02/28/2020: BUN 71;  Creatinine, Ser 15.90; Hemoglobin 9.8; Platelets 175; Potassium 4.8; Sodium 136   Lipid Panel    Component Value Date/Time   CHOL 119 02/27/2020 0622   TRIG 78 02/27/2020 0622   HDL 38 (L) 02/27/2020 0622   CHOLHDL 3.1 02/27/2020 0622   VLDL 16 02/27/2020 0622   LDLCALC 65 02/27/2020 0622   LDLCALC 102 (H) 04/25/2019 1159     Other studies Reviewed: Additional studies/ records that were reviewed today with results demonstrating: ***.   ASSESSMENT AND PLAN:  1.   CAD/Old MI:  2.   PAF: 3.   ESRD: 4.   HTN: 5.   Hyperlipidemia:   Current medicines are reviewed at length with the patient today.  The patient concerns regarding his medicines were addressed.  The following changes have been made:  No change***  Labs/ tests ordered today include: *** No orders of the defined types were placed in this encounter.   Recommend 150 minutes/week of aerobic exercise Low fat, low carb, high fiber diet recommended  Disposition:   FU in ***   Signed, Larae Grooms, MD  04/24/2020 1:20 PM    [facility] [place], [place],   [address] Phone: [phone]; Fax: [phone]

## 2020-04-25 ENCOUNTER — Ambulatory Visit: Payer: Medicare (Managed Care) | Admitting: Interventional Cardiology

## 2020-05-17 NOTE — Progress Notes (Signed)
Cardiology Office Note   Date:  05/19/2020   ID:  [name], DOB 08/05/1970, MRN [id number]  PCP:  [name], MD    No chief complaint on file.  CAD  Wt Readings from Last 3 Encounters:  05/19/20 215 lb 3.2 oz (97.6 kg)  03/13/20 210 lb 12.8 oz (95.6 kg)  02/28/20 211 lb 3.2 oz (95.8 kg)       History of Present Illness: [name]. is a 50 y.o. male  has a hx of uncontrolled DM, ESRD on HD (waiting organ transplant), HTN, HLD, gastroparesis, and diastolic dysfunction who was seen by our team after transfer from [facility] with code STEMI.   He underwent an echo 06/2017 that showed an LVEF of 75%, grade 1 DD and mild MR. He has been recently followed at [facility] for organ transplant (nephrology). Prior to his admission, he was at HD when he developed a sudden episode of hypotension with SOB in 60s and tachycardia at 120s. Per reports, he had completed > 3 hours of dialysis.There was no improvement in BP despite 900cc of fluids at dialysis center. On EMS arrival, EKG showed STE in anterior leads with reciprocal change in inferior lateral leads therefore a code STEMI activated. He was given ASA and heparin bolus and was transfered to [facility] for further evaluation.   Left heart cath in Jan 2022 showed:  "The left ventricular systolic function is normal.  LV end diastolic pressure is normal.  The left ventricular ejection fraction is greater than 65% by visual estimate.  There is no aortic valve stenosis.  Mid LAD lesion is 90% stenosed.  A drug-eluting stent was successfully placed using a STENT RESOLUTE ONYX 3.5X30, postdilated to > 4 mm. Stent optimized with IVUS.  Post intervention, there is a 0% residual stenosis.   I suspect his abnormal ECG and anterior injury pattern was more from demand ischemia in the setting of hypotension and AFib with RVR, with severe mid LAD lesion.    During the cath, he converted to NSR briefly and his pulse pressure and BP  returned to normal without any pressors. In addition, LVEDP only 11 mm Hg despite having 2.4 L NS administered on transport.  He may have been a little dehydrated after 3 L fluid removal after dialysis.   In regards to AFib.  Continue IV Amiodarone.  Will have to decide if he needs longterm anticoagulation along with antiplatelet therapy.  If so, would change Brilinta to clopidogrel.  Would have to check what options work in the setting of ESRD."  Changed to Plavix in 2/22 due to cost.   He had eye surgery in 3/22.  Denies : Chest pain. Dizziness. Leg edema. Nitroglycerin use. Orthopnea. Palpitations. Paroxysmal nocturnal dyspnea. Shortness of breath. Syncope.   Walks daily.  He goes to a gym.  Uses a treadmill.  He owns 2.5 acres and walks his property.  Wife is donating a kidney to him.     Past Medical History:  Diagnosis Date  . Anxiety   . Cellulitis   . CHF (congestive heart failure) ([place])   . Chronic diarrhea   . Chronic kidney disease   . Chronic pain of both ankles   . Diabetes mellitus without complication ([place])   . Diabetes mellitus, type II ([place])   . Diabetic neuropathy ([place])   . Frequent falls   . Gait instability   . GERD (gastroesophageal reflux disease)   . Heart palpitations   . History  of kidney stones   . HLD (hyperlipidemia)   . Hypertension   . Insomnia   . Nausea and vomiting in adult    recurrent    Past Surgical History:  Procedure Laterality Date  . AMPUTATION Left 03/16/2016   Procedure: AMPUTATION DIGIT LEFT HALLUX;  Surgeon: Trula Slade, DPM;  Location: [place];  Service: Podiatry;  Laterality: Left;  can start around 5   . ARTHROSCOPIC REPAIR ACL Left   . AV FISTULA PLACEMENT Right 05/31/2019   Procedure: Brachiocephalic AV fistula creation;  Surgeon: Algernon Huxley, MD;  Location: [name] ORS;  Service: Vascular;  Laterality: Right;  . COLONOSCOPY WITH PROPOFOL N/A 10/28/2015   Procedure: COLONOSCOPY WITH PROPOFOL;  Surgeon: Lollie Sails,  MD;  Location: [facility] ENDOSCOPY;  Service: Endoscopy;  Laterality: N/A;  . COLONOSCOPY WITH PROPOFOL N/A 10/29/2015   Procedure: COLONOSCOPY WITH PROPOFOL;  Surgeon: Lollie Sails, MD;  Location: [facility] ENDOSCOPY;  Service: Endoscopy;  Laterality: N/A;  . CORONARY/GRAFT ACUTE MI REVASCULARIZATION N/A 02/26/2020   Procedure: Coronary/Graft Acute MI Revascularization;  Surgeon: Jettie Booze, MD;  Location: [place] CV LAB;  Service: Cardiovascular;  Laterality: N/A;  . DIALYSIS/PERMA CATHETER INSERTION Right 04/26/2019   Perm Cath   . DIALYSIS/PERMA CATHETER INSERTION N/A 04/26/2019   Procedure: DIALYSIS/PERMA CATHETER INSERTION;  Surgeon: Algernon Huxley, MD;  Location: [place] CV LAB;  Service: Cardiovascular;  Laterality: N/A;  . DIALYSIS/PERMA CATHETER REMOVAL N/A 08/15/2019   Procedure: DIALYSIS/PERMA CATHETER REMOVAL;  Surgeon: Katha Cabal, MD;  Location: [place] CV LAB;  Service: Cardiovascular;  Laterality: N/A;  . ESOPHAGOGASTRODUODENOSCOPY (EGD) WITH PROPOFOL N/A 12/27/2017   Procedure: ESOPHAGOGASTRODUODENOSCOPY (EGD) WITH PROPOFOL;  Surgeon: Toledo, Benay Pike, MD;  Location: [name] ENDOSCOPY;  Service: Gastroenterology;  Laterality: N/A;  . INTRAVASCULAR ULTRASOUND/IVUS N/A 02/26/2020   Procedure: Intravascular Ultrasound/IVUS;  Surgeon: Jettie Booze, MD;  Location: [place] CV LAB;  Service: Cardiovascular;  Laterality: N/A;  . LEFT HEART CATH AND CORONARY ANGIOGRAPHY N/A 02/26/2020   Procedure: LEFT HEART CATH AND CORONARY ANGIOGRAPHY;  Surgeon: Jettie Booze, MD;  Location: [place] CV LAB;  Service: Cardiovascular;  Laterality: N/A;  . PROSTATE SURGERY  2016  . TONSILECTOMY/ADENOIDECTOMY WITH MYRINGOTOMY    . TONSILLECTOMY       Current Outpatient Medications  Medication Sig Dispense Refill  . aspirin 81 MG chewable tablet Chew 1 tablet (81 mg total) by mouth daily. 90 tablet 1  . atorvastatin (LIPITOR) 80 MG tablet Take 1 tablet (80 mg total)  by mouth at bedtime. 90 tablet 0  . carvedilol (COREG) 25 MG tablet Take 25 mg by mouth every morning.    . clopidogrel (PLAVIX) 75 MG tablet START PLAVIX AND TAKE 300 MG ON 2/7, THEN ON 2/8 TAKE PLAVIX 75 MG DAILY. 90 tablet 3  . dorzolamide-timolol (COSOPT) 22.3-6.8 MG/ML ophthalmic solution Place 1 drop into the left eye 2 (two) times daily.    Marland Kitchen lidocaine-prilocaine (EMLA) cream Apply 1 application topically Every Tuesday,Thursday,and Saturday with dialysis.     Marland Kitchen nitroGLYCERIN (NITROSTAT) 0.4 MG SL tablet Place 1 tablet (0.4 mg total) under the tongue every 5 (five) minutes as needed. 25 tablet 2  . pantoprazole (PROTONIX) 40 MG tablet Take 1 tablet (40 mg total) by mouth daily. 90 tablet 3  . prednisoLONE acetate (PRED FORTE) 1 % ophthalmic suspension Place 1 drop into the left eye every 4 (four) hours.    . tamsulosin (FLOMAX) 0.4 MG CAPS capsule Take 0.4  mg by mouth daily after breakfast.     . ticagrelor (BRILINTA) 90 MG TABS tablet Take 1 tablet (90 mg total) by mouth 2 (two) times daily. 180 tablet 2   No current facility-administered medications for this visit.    Allergies:   Levofloxacin, Promethazine, and Malt    Social History:  The patient  reports that he has never smoked. He has never used smokeless tobacco. He reports that he does not drink alcohol and does not use drugs.   Family History:  The patient's family history includes CAD in his father; Diabetes Mellitus II in his mother; Kidney failure in his mother; Schizophrenia in his mother; Stroke in his father.    ROS:  Please see the history of present illness.   Otherwise, review of systems are positive for using a cane.   All other systems are reviewed and negative.    PHYSICAL EXAM: VS:  BP 108/68   Pulse 64   Ht '6\' 3"'$  (1.905 m)   Wt 215 lb 3.2 oz (97.6 kg)   SpO2 95%   BMI 26.90 kg/m  , BMI Body mass index is 26.9 kg/m. GEN: Well nourished, well developed, in no acute distress  HEENT: normal  Neck: no JVD,  carotid bruits, or masses Cardiac: RRR; no murmurs, rubs, or gallops,no edema  Respiratory:  clear to auscultation bilaterally, normal work of breathing GI: soft, nontender, nondistended, + BS MS: no deformity or atrophy  Skin: warm and dry, no rash Neuro:  Strength and sensation are intact Psych: euthymic mood, full affect   EKG:   The ekg ordered today demonstrates    Recent Labs: 02/26/2020: ALT 7 02/28/2020: BUN 71; Creatinine, Ser 15.90; Hemoglobin 9.8; Platelets 175; Potassium 4.8; Sodium 136   Lipid Panel    Component Value Date/Time   CHOL 119 02/27/2020 0622   TRIG 78 02/27/2020 0622   HDL 38 (L) 02/27/2020 0622   CHOLHDL 3.1 02/27/2020 0622   VLDL 16 02/27/2020 0622   LDLCALC 65 02/27/2020 0622   LDLCALC 102 (H) 04/25/2019 1159     Other studies Reviewed: Additional studies/ records that were reviewed today with results demonstrating: labs reviewed.   ASSESSMENT AND PLAN:  1. CAD/Old MI: No angina.  Continue aggressive secondary prevention. Low phosphorus diet.  Gastroparesis limits his diet.  Avoid processed foods. 2. ESRD: T/Th dialysis.  Awaiting kidney transplant, LRD from his wife at [facility].  Surgery planned for July 2022. 3. PAF: No palpitations.  May have been triggered by fluid removal the day of cath.  4. HTN: The current medical regimen is effective;  continue present plan and medications. 5. Hyperlipidemia: LDL 65 in Jan 2022. Continue atorvastatin.     Current medicines are reviewed at length with the patient today.  The patient concerns regarding his medicines were addressed.  The following changes have been made:  No change  Labs/ tests ordered today include:  No orders of the defined types were placed in this encounter.   Recommend 150 minutes/week of aerobic exercise Low fat, low carb, high fiber diet recommended  Disposition:   FU in 6 months   Signed, Larae Grooms, MD  05/19/2020 8:29 AM    [facility] [address], [place], [place]  [address] Phone: [phone]; Fax: [phone]

## 2020-05-19 ENCOUNTER — Ambulatory Visit: Payer: Medicare (Managed Care) | Admitting: Interventional Cardiology

## 2020-05-19 ENCOUNTER — Other Ambulatory Visit: Payer: Self-pay

## 2020-05-19 ENCOUNTER — Encounter: Payer: Self-pay | Admitting: Interventional Cardiology

## 2020-05-19 VITALS — BP 108/68 | HR 64 | Ht 75.0 in | Wt 215.2 lb

## 2020-05-19 DIAGNOSIS — I25118 Atherosclerotic heart disease of native coronary artery with other forms of angina pectoris: Secondary | ICD-10-CM

## 2020-05-19 DIAGNOSIS — I252 Old myocardial infarction: Secondary | ICD-10-CM | POA: Diagnosis not present

## 2020-05-19 DIAGNOSIS — E782 Mixed hyperlipidemia: Secondary | ICD-10-CM

## 2020-05-19 DIAGNOSIS — I10 Essential (primary) hypertension: Secondary | ICD-10-CM | POA: Diagnosis not present

## 2020-05-19 DIAGNOSIS — I480 Paroxysmal atrial fibrillation: Secondary | ICD-10-CM

## 2020-05-19 NOTE — Patient Instructions (Signed)
Medication Instructions:  Your physician recommends that you continue on your current medications as directed. Please refer to the Current Medication list given to you today.  *If you need a refill on your cardiac medications before your next appointment, please call your pharmacy*   Lab Work: none If you have labs (blood work) drawn today and your tests are completely normal, you will receive your results only by: MyChart Message (if you have MyChart) OR A paper copy in the mail If you have any lab test that is abnormal or we need to change your treatment, we will call you to review the results.   Testing/Procedures: none   Follow-Up: At [facility], you and your health needs are our priority.  As part of our continuing mission to provide you with exceptional heart care, we have created designated Provider Care Teams.  These Care Teams include your primary Cardiologist (physician) and Advanced Practice Providers (APPs -  Physician Assistants and Nurse Practitioners) who all work together to provide you with the care you need, when you need it.  We recommend signing up for the patient portal called "MyChart".  Sign up information is provided on this After Visit Summary.  MyChart is used to connect with patients for Virtual Visits (Telemedicine).  Patients are able to view lab/test results, encounter notes, upcoming appointments, etc.  Non-urgent messages can be sent to your provider as well.   To learn more about what you can do with MyChart, go to [email or web address].    Your next appointment:   6 month(s)  The format for your next appointment:   In Person  Provider:   You may see Jayadeep Varanasi, MD or one of the following Advanced Practice Providers on your designated Care Team:   Dayna Dunn, PA-C Michele Lenze, PA-C   Other Instructions   

## 2020-08-08 ENCOUNTER — Encounter (INDEPENDENT_AMBULATORY_CARE_PROVIDER_SITE_OTHER): Payer: Medicare (Managed Care)

## 2020-08-08 ENCOUNTER — Ambulatory Visit (INDEPENDENT_AMBULATORY_CARE_PROVIDER_SITE_OTHER): Payer: Medicare (Managed Care) | Admitting: Vascular Surgery

## 2020-08-15 DIAGNOSIS — H5440 Blindness, one eye, unspecified eye: Secondary | ICD-10-CM | POA: Insufficient documentation

## 2020-08-19 ENCOUNTER — Ambulatory Visit (INDEPENDENT_AMBULATORY_CARE_PROVIDER_SITE_OTHER): Payer: Medicare Other

## 2020-08-19 ENCOUNTER — Other Ambulatory Visit: Payer: Self-pay

## 2020-08-19 ENCOUNTER — Encounter (INDEPENDENT_AMBULATORY_CARE_PROVIDER_SITE_OTHER): Payer: Self-pay | Admitting: Vascular Surgery

## 2020-08-19 ENCOUNTER — Ambulatory Visit (INDEPENDENT_AMBULATORY_CARE_PROVIDER_SITE_OTHER): Payer: Medicare Other | Admitting: Vascular Surgery

## 2020-08-19 ENCOUNTER — Other Ambulatory Visit (INDEPENDENT_AMBULATORY_CARE_PROVIDER_SITE_OTHER): Payer: Self-pay | Admitting: Vascular Surgery

## 2020-08-19 VITALS — BP 161/86 | HR 71 | Resp 16 | Wt 202.6 lb

## 2020-08-19 DIAGNOSIS — I509 Heart failure, unspecified: Secondary | ICD-10-CM | POA: Insufficient documentation

## 2020-08-19 DIAGNOSIS — Z794 Long term (current) use of insulin: Secondary | ICD-10-CM

## 2020-08-19 DIAGNOSIS — E1165 Type 2 diabetes mellitus with hyperglycemia: Secondary | ICD-10-CM

## 2020-08-19 DIAGNOSIS — N139 Obstructive and reflux uropathy, unspecified: Secondary | ICD-10-CM | POA: Insufficient documentation

## 2020-08-19 DIAGNOSIS — F329 Major depressive disorder, single episode, unspecified: Secondary | ICD-10-CM | POA: Insufficient documentation

## 2020-08-19 DIAGNOSIS — I252 Old myocardial infarction: Secondary | ICD-10-CM | POA: Insufficient documentation

## 2020-08-19 DIAGNOSIS — Z89412 Acquired absence of left great toe: Secondary | ICD-10-CM | POA: Insufficient documentation

## 2020-08-19 DIAGNOSIS — L84 Corns and callosities: Secondary | ICD-10-CM | POA: Insufficient documentation

## 2020-08-19 DIAGNOSIS — F411 Generalized anxiety disorder: Secondary | ICD-10-CM | POA: Insufficient documentation

## 2020-08-19 DIAGNOSIS — N186 End stage renal disease: Secondary | ICD-10-CM | POA: Diagnosis not present

## 2020-08-19 DIAGNOSIS — N529 Male erectile dysfunction, unspecified: Secondary | ICD-10-CM | POA: Insufficient documentation

## 2020-08-19 DIAGNOSIS — I10 Essential (primary) hypertension: Secondary | ICD-10-CM

## 2020-08-19 DIAGNOSIS — Z992 Dependence on renal dialysis: Secondary | ICD-10-CM

## 2020-08-19 DIAGNOSIS — M791 Myalgia, unspecified site: Secondary | ICD-10-CM | POA: Insufficient documentation

## 2020-08-19 DIAGNOSIS — E782 Mixed hyperlipidemia: Secondary | ICD-10-CM

## 2020-08-19 DIAGNOSIS — R269 Unspecified abnormalities of gait and mobility: Secondary | ICD-10-CM | POA: Insufficient documentation

## 2020-08-19 NOTE — Progress Notes (Signed)
MRN : [id number]  [name]. is a 50 y.o. ([birth year]) male who presents with chief complaint of  Chief Complaint  Patient presents with   Follow-up    Ultrasound follow up  .  History of Present Illness: Patient returns today in follow up of his dialysis access.  His right brachiocephalic AV fistula is working well without any major issues or problems.  He denies any prolonged bleeding, difficulty with access, or low flow rates.  Duplex today shows a widely patent right brachiocephalic AV fistula with a flow volume of over 3 L a minute.  Current Outpatient Medications  Medication Sig Dispense Refill   aspirin 81 MG chewable tablet CHEW 1 TABLET (81 MG TOTAL) BY MOUTH DAILY. 90 tablet 1   atorvastatin (LIPITOR) 80 MG tablet TAKE 1 TABLET (80 MG TOTAL) BY MOUTH AT BEDTIME. 90 tablet 0   carvedilol (COREG) 25 MG tablet Take 25 mg by mouth every morning.     clopidogrel (PLAVIX) 75 MG tablet START PLAVIX AND TAKE 300 MG ON 2/7, THEN ON 2/8 TAKE PLAVIX 75 MG DAILY. 90 tablet 3   dorzolamide-timolol (COSOPT) 22.3-6.8 MG/ML ophthalmic solution Place 1 drop into the left eye 2 (two) times daily.     lidocaine-prilocaine (EMLA) cream Apply 1 application topically Every Tuesday,Thursday,and Saturday with dialysis.      nitroGLYCERIN (NITROSTAT) 0.4 MG SL tablet PLACE 1 TABLET (0.4 MG TOTAL) UNDER THE TONGUE EVERY FIVE MINUTES AS NEEDED. 25 tablet 2   pantoprazole (PROTONIX) 40 MG tablet Take 1 tablet (40 mg total) by mouth daily. 90 tablet 3   prednisoLONE acetate (PRED FORTE) 1 % ophthalmic suspension Place 1 drop into the left eye every 4 (four) hours.     tamsulosin (FLOMAX) 0.4 MG CAPS capsule Take 0.4 mg by mouth daily after breakfast.      No current facility-administered medications for this visit.    Past Medical History:  Diagnosis Date   Anxiety    Cellulitis    CHF (congestive heart failure) (HCC)    Chronic diarrhea    Chronic kidney disease    Chronic pain of both  ankles    Diabetes mellitus without complication (HCC)    Diabetes mellitus, type II (HCC)    Diabetic neuropathy (HCC)    Frequent falls    Gait instability    GERD (gastroesophageal reflux disease)    Heart palpitations    History of kidney stones    HLD (hyperlipidemia)    Hypertension    Insomnia    Nausea and vomiting in adult    recurrent    Past Surgical History:  Procedure Laterality Date   AMPUTATION Left 03/16/2016   Procedure: AMPUTATION DIGIT LEFT HALLUX;  Surgeon: Trula Slade, DPM;  Location: [place];  Service: Podiatry;  Laterality: Left;  can start around 5    ARTHROSCOPIC REPAIR ACL Left    AV FISTULA PLACEMENT Right 05/31/2019   Procedure: Brachiocephalic AV fistula creation;  Surgeon: Algernon Huxley, MD;  Location: [name] ORS;  Service: Vascular;  Laterality: Right;   COLONOSCOPY WITH PROPOFOL N/A 10/28/2015   Procedure: COLONOSCOPY WITH PROPOFOL;  Surgeon: Lollie Sails, MD;  Location: [facility] ENDOSCOPY;  Service: Endoscopy;  Laterality: N/A;   COLONOSCOPY WITH PROPOFOL N/A 10/29/2015   Procedure: COLONOSCOPY WITH PROPOFOL;  Surgeon: Lollie Sails, MD;  Location: [facility] ENDOSCOPY;  Service: Endoscopy;  Laterality: N/A;   CORONARY/GRAFT ACUTE MI REVASCULARIZATION N/A 02/26/2020   Procedure: Coronary/Graft Acute MI Revascularization;  Surgeon: Jettie Booze, MD;  Location: [place] CV LAB;  Service: Cardiovascular;  Laterality: N/A;   DIALYSIS/PERMA CATHETER INSERTION Right 04/26/2019   Perm Cath    DIALYSIS/PERMA CATHETER INSERTION N/A 04/26/2019   Procedure: DIALYSIS/PERMA CATHETER INSERTION;  Surgeon: Algernon Huxley, MD;  Location: [place] CV LAB;  Service: Cardiovascular;  Laterality: N/A;   DIALYSIS/PERMA CATHETER REMOVAL N/A 08/15/2019   Procedure: DIALYSIS/PERMA CATHETER REMOVAL;  Surgeon: Katha Cabal, MD;  Location: [place] CV LAB;  Service: Cardiovascular;  Laterality: N/A;   ESOPHAGOGASTRODUODENOSCOPY (EGD) WITH PROPOFOL N/A 12/27/2017    Procedure: ESOPHAGOGASTRODUODENOSCOPY (EGD) WITH PROPOFOL;  Surgeon: Toledo, Benay Pike, MD;  Location: [name] ENDOSCOPY;  Service: Gastroenterology;  Laterality: N/A;   INTRAVASCULAR ULTRASOUND/IVUS N/A 02/26/2020   Procedure: Intravascular Ultrasound/IVUS;  Surgeon: Jettie Booze, MD;  Location: [place] CV LAB;  Service: Cardiovascular;  Laterality: N/A;   LEFT HEART CATH AND CORONARY ANGIOGRAPHY N/A 02/26/2020   Procedure: LEFT HEART CATH AND CORONARY ANGIOGRAPHY;  Surgeon: Jettie Booze, MD;  Location: [place] CV LAB;  Service: Cardiovascular;  Laterality: N/A;   PROSTATE SURGERY  2016   TONSILECTOMY/ADENOIDECTOMY WITH MYRINGOTOMY     TONSILLECTOMY       Social History   Tobacco Use   Smoking status: Never   Smokeless tobacco: Never  Vaping Use   Vaping Use: Never used  Substance Use Topics   Alcohol use: No   Drug use: No       Family History  Problem Relation Age of Onset   CAD Father    Stroke Father    Diabetes Mellitus II Mother    Kidney failure Mother    Schizophrenia Mother      Allergies  Allergen Reactions   Levofloxacin Swelling    Of face Other reaction(s): swelling   Promethazine Diarrhea and Other (See Comments)    Other reaction(s): Other (See Comments), Other (See Comments) Other reaction(s): Muscle cramps Muscle cramps Cramping Other reaction(s): Muscle cramps Muscle cramps   Other     Other reaction(s): Unknown   Malt Other (See Comments)    Other reaction(s): cramping     REVIEW OF SYSTEMS (Negative unless checked)  Constitutional: '[]'$ Weight loss  '[]'$ Fever  '[]'$ Chills Cardiac: '[]'$ Chest pain   '[]'$ Chest pressure   '[]'$ Palpitations   '[]'$ Shortness of breath when laying flat   '[]'$ Shortness of breath at rest   '[x]'$ Shortness of breath with exertion. Vascular:  '[]'$ Pain in legs with walking   '[]'$ Pain in legs at rest   '[]'$ Pain in legs when laying flat   '[]'$ Claudication   '[]'$ Pain in feet when walking  '[]'$ Pain in feet at rest  '[]'$ Pain in feet when  laying flat   '[]'$ History of DVT   '[]'$ Phlebitis   '[]'$ Swelling in legs   '[]'$ Varicose veins   '[]'$ Non-healing ulcers Pulmonary:   '[]'$ Uses home oxygen   '[]'$ Productive cough   '[]'$ Hemoptysis   '[]'$ Wheeze  '[]'$ COPD   '[]'$ Asthma Neurologic:  '[]'$ Dizziness  '[]'$ Blackouts   '[]'$ Seizures   '[]'$ History of stroke   '[]'$ History of TIA  '[]'$ Aphasia   '[]'$ Temporary blindness   '[]'$ Dysphagia   '[]'$ Weakness or numbness in arms   '[]'$ Weakness or numbness in legs Musculoskeletal:  '[]'$ Arthritis   '[]'$ Joint swelling   '[]'$ Joint pain   '[]'$ Low back pain Hematologic:  '[]'$ Easy bruising  '[]'$ Easy bleeding   '[]'$ Hypercoagulable state   '[]'$ Anemic   Gastrointestinal:  '[]'$ Blood in stool   '[]'$ Vomiting blood  '[x]'$ Gastroesophageal reflux/heartburn   '[]'$ Abdominal pain Genitourinary:  '[x]'$ Chronic kidney disease   '[]'$   Difficult urination  '[]'$ Frequent urination  '[]'$ Burning with urination   '[]'$ Hematuria Skin:  '[]'$ Rashes   '[]'$ Ulcers   '[]'$ Wounds Psychological:  '[x]'$ History of anxiety   '[]'$  History of major depression.  Physical Examination  BP (!) 161/86 (BP Location: Left Arm)   Pulse 71   Resp 16   Wt 202 lb 9.6 oz (91.9 kg)   BMI 25.32 kg/m  Gen:  WD/WN, NAD Head: [place]/AT, No temporalis wasting. Ear/Nose/Throat: Hearing grossly intact, nares w/o erythema or drainage Eyes: Conjunctiva clear. Sclera non-icteric Neck: Supple.  Trachea midline Pulmonary:  Good air movement, no use of accessory muscles.  Cardiac: RRR, no JVD Vascular: Good thrill in right brachiocephalic AV fistula Vessel Right Left  Radial Palpable Palpable                           Musculoskeletal: M/S 5/5 throughout.  No deformity or atrophy. No edema. Neurologic: Sensation grossly intact in extremities.  Symmetrical.  Speech is fluent.  Psychiatric: Judgment intact, Mood & affect appropriate for pt's clinical situation. Dermatologic: No rashes or ulcers noted.  No cellulitis or open wounds.      Labs No results found for this or any previous visit (from the past 2160 hour(s)).  Radiology No results  found.  Assessment/Plan  1. ESRD on dialysis [facility])  Duplex today shows a widely patent right brachiocephalic AV fistula with a flow volume of over 3 L a minute. Recommend:   The patient is doing well and currently has adequate dialysis access. The patient's dialysis center is not reporting any access issues. Flow pattern is stable when compared to the prior ultrasound.   The patient should have a duplex ultrasound of the dialysis access in 12 months. The patient will follow-up with me in the office after each ultrasound        2. Essential hypertension, benign Continue antihypertensive medications as already ordered, these medications have been reviewed and there are no changes at this time.     3. Uncontrolled type 2 diabetes mellitus with hyperglycemia, with long-term current use of insulin (HCC) Continue hypoglycemic medications as already ordered, these medications have been reviewed and there are no changes at this time.   Hgb A1C to be monitored as already arranged by primary service     4. Mixed hyperlipidemia Continue statin as ordered and reviewed, no changes at this time   [name], MD  08/19/2020 11:27 AM    This note was created with Dragon medical transcription system.  Any errors from dictation are purely unintentional

## 2020-09-09 ENCOUNTER — Telehealth: Payer: Self-pay

## 2020-09-09 NOTE — Telephone Encounter (Signed)
   [facility] Pre-operative Risk Assessment    Patient Name: [name].  DOB: [birth year] MRN: [id number]  HEARTCARE STAFF:  - IMPORTANT!!!!!! Under Visit Info/Reason for Call, type in Other and utilize the format Clearance MM/DD/YY or Clearance TBD. Do not use dashes or single digits. - Please review there is not already an duplicate clearance open for this procedure. - If request is for dental extraction, please clarify the # of teeth to be extracted. - If the patient is currently at the dentist's office, call Pre-Op Callback Staff (MA/nurse) to input urgent request.  - If the patient is not currently in the dentist office, please route to the Pre-Op pool.  Request for surgical clearance:  What type of surgery is being performed? Removal of Left Eye  When is this surgery scheduled? 09/16/2020  What type of clearance is required (medical clearance vs. Pharmacy clearance to hold med vs. Both)? Pharmacy  Are there any medications that need to be held prior to surgery and how long? Plavix   Practice name and name of physician performing surgery? [name]   Dr Yvetta Coder  What is the office phone number? [phone] (contact person Molli Barrows)   7.   What is the office fax number? [phone]  8.   Anesthesia type (None, local, MAC, general) ? [name] 09/09/2020, 9:38 AM  _________________________________________________________________   (provider comments below)

## 2020-09-09 NOTE — Telephone Encounter (Signed)
[name] 50 year old male is requesting preoperative cardiac evaluation for removal of his left eye.  He was last seen in the clinic on 05/19/2020.  He denied chest pain, dizziness, lower extremity swelling, orthopnea, palpitations, and PND.  He was very active walking daily, going to the gym, using treadmill, and walking on his [place] acre property.  His wife was in the process of donating a kidney to him.   His PMH includes uncontrolled diabetes, end-stage renal disease, hypertension, hyperlipidemia, gastroparesis, and diastolic dysfunction.  He previously had low blood pressure and dialysis and received a 9000 cc fluid bolus.  He was not noted to have improvement in his blood pressure and was transferred ported via EMS to the emergency department.  His EKG showed ST elevation in anterior leads with reciprocal changes in his inferior leads and code STEMI was activated.  He underwent left heart catheterization 1/22 which showed normal LVEF mid LAD 90% stenosed and received PCI with DES x1.  He was also noted to have A. fib with RVR but converted to normal sinus rhythm during cardiac catheterization.  He was transitioned to Plavix 2/22 due to cost.  He underwent eye surgery 3/22.  May his Plavix be held prior to his procedure?  Thank you for your help.  Please direct response to CV DIV preop will.  [name] [name] NP-C    09/09/2020, 11:35 AM [place] [address] Office [phone] Fax [phone]

## 2020-09-11 NOTE — Telephone Encounter (Signed)
   Ideally would wait 1 year prior to surgery after MI and stent, but if surgery is more urgent, I think it is ok to hold plavix 5 days prior to surgery after 6 months of DAPT.  Would aspirin need to be stopped as well?  Jettie Booze, MD

## 2020-09-12 NOTE — Telephone Encounter (Signed)
Is patient on Eliquis? There is no Eliquis on his medication list. If he is, ok to hold 24hr pre and post if surgeon is ok with doing surgery with that length of hold.  Patient is an ESRD so clearance of Eliquis is unknown. But I will defer to Dr. Irish Lack surgeon recommendation as bleed risk of procedure is unknown to me.

## 2020-09-12 NOTE — Telephone Encounter (Signed)
Called the requesting surgeon's office, Dr. Leonard Schwartz, left a detailed message for his office to call back regarding surgical clearance.

## 2020-09-12 NOTE — Telephone Encounter (Signed)
[name] called back from Dr. Recardo Evangelist office.  Per Dr. 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 He is asking to hold pt's Eliquis 24 hours prior to the procedure and pt can restart 24 hours after, he is recommending holding the Aspirin and Plavix 7 days prior and can restart 7 days after.    Kimberly aware I will send back to the preop team.

## 2020-09-15 ENCOUNTER — Other Ambulatory Visit: Payer: Self-pay

## 2020-09-15 ENCOUNTER — Emergency Department (HOSPITAL_COMMUNITY)
Admission: EM | Admit: 2020-09-15 | Discharge: 2020-09-15 | Disposition: A | Discharge status: Home / Self Care | Payer: Medicare Other | Attending: Emergency Medicine | Admitting: Emergency Medicine

## 2020-09-15 ENCOUNTER — Encounter (HOSPITAL_COMMUNITY): Payer: Self-pay

## 2020-09-15 DIAGNOSIS — E11319 Type 2 diabetes mellitus with unspecified diabetic retinopathy without macular edema: Secondary | ICD-10-CM | POA: Diagnosis not present

## 2020-09-15 DIAGNOSIS — R531 Weakness: Secondary | ICD-10-CM

## 2020-09-15 DIAGNOSIS — R197 Diarrhea, unspecified: Secondary | ICD-10-CM | POA: Diagnosis not present

## 2020-09-15 DIAGNOSIS — N186 End stage renal disease: Secondary | ICD-10-CM | POA: Diagnosis not present

## 2020-09-15 DIAGNOSIS — E876 Hypokalemia: Secondary | ICD-10-CM | POA: Diagnosis not present

## 2020-09-15 DIAGNOSIS — E1140 Type 2 diabetes mellitus with diabetic neuropathy, unspecified: Secondary | ICD-10-CM | POA: Diagnosis not present

## 2020-09-15 DIAGNOSIS — E1122 Type 2 diabetes mellitus with diabetic chronic kidney disease: Secondary | ICD-10-CM | POA: Diagnosis not present

## 2020-09-15 DIAGNOSIS — I5032 Chronic diastolic (congestive) heart failure: Secondary | ICD-10-CM | POA: Insufficient documentation

## 2020-09-15 DIAGNOSIS — I132 Hypertensive heart and chronic kidney disease with heart failure and with stage 5 chronic kidney disease, or end stage renal disease: Secondary | ICD-10-CM | POA: Insufficient documentation

## 2020-09-15 DIAGNOSIS — Z79899 Other long term (current) drug therapy: Secondary | ICD-10-CM | POA: Diagnosis not present

## 2020-09-15 DIAGNOSIS — Z955 Presence of coronary angioplasty implant and graft: Secondary | ICD-10-CM | POA: Insufficient documentation

## 2020-09-15 DIAGNOSIS — R112 Nausea with vomiting, unspecified: Secondary | ICD-10-CM | POA: Insufficient documentation

## 2020-09-15 DIAGNOSIS — D631 Anemia in chronic kidney disease: Secondary | ICD-10-CM | POA: Insufficient documentation

## 2020-09-15 DIAGNOSIS — Z992 Dependence on renal dialysis: Secondary | ICD-10-CM | POA: Diagnosis not present

## 2020-09-15 DIAGNOSIS — Z7982 Long term (current) use of aspirin: Secondary | ICD-10-CM | POA: Insufficient documentation

## 2020-09-15 LAB — CBC WITH DIFFERENTIAL/PLATELET
Abs Immature Granulocytes: 0.03 10*3/uL (ref 0.00–0.07)
Basophils Absolute: 0 10*3/uL (ref 0.0–0.1)
Basophils Relative: 0 %
Eosinophils Absolute: 0.2 10*3/uL (ref 0.0–0.5)
Eosinophils Relative: 2 %
HCT: 34.2 % — ABNORMAL LOW (ref 39.0–52.0)
Hemoglobin: 10.8 g/dL — ABNORMAL LOW (ref 13.0–17.0)
Immature Granulocytes: 0 %
Lymphocytes Relative: 10 %
Lymphs Abs: 0.8 10*3/uL (ref 0.7–4.0)
MCH: 24.4 pg — ABNORMAL LOW (ref 26.0–34.0)
MCHC: 31.6 g/dL (ref 30.0–36.0)
MCV: 77.2 fL — ABNORMAL LOW (ref 80.0–100.0)
Monocytes Absolute: 0.7 10*3/uL (ref 0.1–1.0)
Monocytes Relative: 9 %
Neutro Abs: 6.1 10*3/uL (ref 1.7–7.7)
Neutrophils Relative %: 79 %
Platelets: 255 10*3/uL (ref 150–400)
RBC: 4.43 MIL/uL (ref 4.22–5.81)
RDW: 17.1 % — ABNORMAL HIGH (ref 11.5–15.5)
WBC: 7.8 10*3/uL (ref 4.0–10.5)
nRBC: 0 % (ref 0.0–0.2)

## 2020-09-15 LAB — COMPREHENSIVE METABOLIC PANEL
ALT: 6 U/L (ref 0–44)
AST: 7 U/L — ABNORMAL LOW (ref 15–41)
Albumin: 3.3 g/dL — ABNORMAL LOW (ref 3.5–5.0)
Alkaline Phosphatase: 68 U/L (ref 38–126)
Anion gap: 13 (ref 5–15)
BUN: 44 mg/dL — ABNORMAL HIGH (ref 6–20)
CO2: 19 mmol/L — ABNORMAL LOW (ref 22–32)
Calcium: 8.3 mg/dL — ABNORMAL LOW (ref 8.9–10.3)
Chloride: 103 mmol/L (ref 98–111)
Creatinine, Ser: 12.15 mg/dL — ABNORMAL HIGH (ref 0.61–1.24)
GFR, Estimated: 5 mL/min — ABNORMAL LOW (ref >60)
Glucose, Bld: 124 mg/dL — ABNORMAL HIGH (ref 70–99)
Potassium: 2.7 mmol/L — CL (ref 3.5–5.1)
Sodium: 135 mmol/L (ref 135–145)
Total Bilirubin: 0.7 mg/dL (ref 0.3–1.2)
Total Protein: 6.6 g/dL (ref 6.5–8.1)

## 2020-09-15 LAB — MAGNESIUM: Magnesium: 1.7 mg/dL (ref 1.7–2.4)

## 2020-09-15 MED ORDER — CARVEDILOL 12.5 MG PO TABS
25.0000 mg | ORAL_TABLET | Freq: Every morning | ORAL | Status: DC
  Administered 2020-09-15: 25 mg via ORAL
  Filled 2020-09-15: qty 2

## 2020-09-15 MED ORDER — SODIUM CHLORIDE 0.9 % IV BOLUS
500.0000 mL | Freq: Once | INTRAVENOUS | Status: AC
Start: 2020-09-15 — End: 2020-09-15
  Administered 2020-09-15: 500 mL via INTRAVENOUS

## 2020-09-15 MED ORDER — POTASSIUM CHLORIDE CRYS ER 20 MEQ PO TBCR
40.0000 meq | EXTENDED_RELEASE_TABLET | Freq: Once | ORAL | Status: AC
  Administered 2020-09-15: 40 meq via ORAL
  Filled 2020-09-15: qty 2

## 2020-09-15 MED ORDER — LOSARTAN POTASSIUM 25 MG PO TABS
25.0000 mg | ORAL_TABLET | Freq: Once | ORAL | Status: AC
  Administered 2020-09-15: 25 mg via ORAL
  Filled 2020-09-15: qty 1

## 2020-09-15 MED ORDER — POTASSIUM CHLORIDE 10 MEQ/100ML IV SOLN
10.0000 meq | INTRAVENOUS | Status: AC
  Administered 2020-09-15: 10 meq via INTRAVENOUS
  Filled 2020-09-15 (×2): qty 100

## 2020-09-15 NOTE — Discharge Instructions (Addendum)
You came to the [facility] today to be evaluated for your fatigue, nausea, vomiting, and diarrhea.  Your lab work showed that your potassium was low.  This may have been due to your nausea, vomiting, and diarrhea.  Because of this he was given potassium.  Please make sure that your potassium is checked prior to your next dialysis appointment.  Get help right away if you: Have chest pain. Have shortness of breath. Have vomiting or diarrhea that lasts for more than 2 days. Faint.

## 2020-09-15 NOTE — ED Provider Notes (Signed)
[facility] Provider Note   CSN: [id number] Arrival date & time: 09/15/20  [id number]     History Chief Complaint  Patient presents with   Weakness    Unbale to eat    [name]. is a 50 y.o. male with a history of CHF, end-stage renal disease on dialysis (Monday, Friday), diabetes mellitus, GERD, hypertension.  Patient presents with a chief complaint of lightheadedness and stating he is "dehydrated."  Patient reports that he has felt lightheaded over the last "few weeks."  Patient reports that lightheadedness has been unchanged over this time.  Lightheadedness is worse with changing positions.  Patient states that he has been eating and drinking however states that this is going "right through me,".  Patient endorses diarrhea, nausea, and vomiting.  Patient states that he has vomited 3 times in the last 24 hours.  Patient describes emesis as "just fluid," patient denies any coffee-ground emesis or hematemesis.    Patient denies any fevers, chills, URI symptoms, chest pain, shortness of breath, abdominal pain, constipation, syncope, numbness, weakness, facial asymmetry, slurred speech.  Patient denies any recent falls or injuries.  Patient last received dialysis earlier today.  Patient reports that he is anuric.     Weakness Associated symptoms: diarrhea, nausea and vomiting   Associated symptoms: no abdominal pain, no chest pain, no cough, no dizziness, no fever, no headaches, no seizures and no shortness of breath       Past Medical History:  Diagnosis Date   Anxiety    Cellulitis    CHF (congestive heart failure) (HCC)    Chronic diarrhea    Chronic kidney disease    Chronic pain of both ankles    Diabetes mellitus without complication (HCC)    Diabetes mellitus, type II (HCC)    Diabetic neuropathy (HCC)    Frequent falls    Gait instability    GERD (gastroesophageal reflux disease)    Heart palpitations    History of kidney stones    HLD  (hyperlipidemia)    Hypertension    Insomnia    Nausea and vomiting in adult    recurrent    Patient Active Problem List   Diagnosis Date Noted   Abnormal gait 08/19/2020   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[place]) 08/19/2020   Anxiety state 08/19/2020   Callosity 08/19/2020   ED (erectile dysfunction) of organic origin 08/19/2020   Heart failure ([place]) 08/19/2020   Long term (current) use of insulin ([place]) 08/19/2020   Myalgia 08/19/2020   Obstructive uropathy 08/19/2020   Reactive depression 08/19/2020   Old myocardial infarction 08/19/2020   ESRD on dialysis ([place]) 08/19/2020   Blind painful left eye 08/15/2020   Combined forms of age-related cataract of right eye 04/17/2020   Supraventricular tachycardia ([place]) 02/26/2020   Acute anterior wall MI ([place])    Benign prostatic hyperplasia with lower urinary tract symptoms 11/07/2019   Fluid overload, unspecified 08/15/2019   Hypertension secondary to other renal disorders 07/20/2019   NVG (neovascular glaucoma), left, 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   Symptomatic anemia 06/05/2019   Aftercare including intermittent dialysis ([place]) 05/15/2019   Moderate protein-calorie malnutrition ([place]) 05/14/2019   Anaphylactic reaction due to adverse effect of correct drug or medicament properly administered, initial encounter 05/08/2019   Anemia in chronic kidney disease 05/07/2019   Chest pain, unspecified 05/07/2019   Coagulation defect, unspecified ([place]) 05/07/2019   Disorder of phosphorus metabolism, unspecified 05/07/2019   Encounter for fitting and adjustment of extracorporeal  dialysis catheter ([place]) 05/07/2019   Gastroparesis 05/07/2019   Iron deficiency anemia, unspecified 05/07/2019   Major depressive disorder, recurrent, unspecified ([place]) 05/07/2019   Pain, unspecified 05/07/2019   Pruritus, unspecified 05/07/2019   Dizziness Q000111Q   Metabolic acidosis Q000111Q   Syncope 04/06/2019   Urinary retention 04/06/2019    Hypertensive chronic kidney disease with stage 1 through stage 4 chronic kidney disease, or unspecified chronic kidney disease 11/28/2018   Edema 11/28/2018   Hypokalemia 11/28/2018   Hypo-osmolality and hyponatremia 11/28/2018   Proteinuria 11/28/2018   Secondary hyperparathyroidism of renal origin ([place]) 11/28/2018   Lymphedema 01/09/2018   Chronic diastolic heart failure ([place]) 12/13/2017   Vomiting 11/28/2017   Acute on chronic heart failure ([place]) 11/23/2017   Type 2 diabetes mellitus with stage 4 chronic kidney disease, with long-term current use of insulin ([place]) 11/23/2017   Diabetic retinopathy ([place]) 07/05/2017   Uncontrolled diabetes mellitus ([place]) 07/08/2016   Uncontrolled type 2 diabetes mellitus with hyperglycemia, with long-term current use of insulin ([place]) 04/20/2016   Osteomyelitis of toe of left foot ([place]) 03/15/2016   Acute renal failure with tubular necrosis ([place]) 06/23/2015   Diarrhea 06/23/2015   Mixed hyperlipidemia    Essential hypertension, benign    Heart palpitations 05/06/2015    Past Surgical History:  Procedure Laterality Date   AMPUTATION Left 03/16/2016   Procedure: AMPUTATION DIGIT LEFT HALLUX;  Surgeon: Trula Slade, DPM;  Location: [facility];  Service: Podiatry;  Laterality: Left;  can start around 5    ARTHROSCOPIC REPAIR ACL Left    AV FISTULA PLACEMENT Right 05/31/2019   Procedure: Brachiocephalic AV fistula creation;  Surgeon: Algernon Huxley, MD;  Location: [name];  Service: Vascular;  Laterality: Right;   COLONOSCOPY WITH PROPOFOL N/A 10/28/2015   Procedure: COLONOSCOPY WITH PROPOFOL;  Surgeon: Lollie Sails, MD;  Location: [facility] ENDOSCOPY;  Service: Endoscopy;  Laterality: N/A;   COLONOSCOPY WITH PROPOFOL N/A 10/29/2015   Procedure: COLONOSCOPY WITH PROPOFOL;  Surgeon: Lollie Sails, MD;  Location[facility]COPY;  Service: Endoscopy;  Laterality: N/A;   CORONARY/GRAFT ACUTE MI REVASCULARIZATION N/A 02/26/2020   Procedure: Coronary/Graft Acute MI  Revascularization;  Surgeon: Jettie Booze, MD;  Location: [facility];  Service: Cardiovascular;  Laterality: N/A;   DIALYSIS/PERMA CATHETER INSERTION Right 04/26/2019   Perm Cath    DIALYSIS/PERMA CATHETER INSERTION N/A 04/26/2019   Procedure: DIALYSIS/PERMA CATHETER INSERTION;  Surgeon: Algernon Huxley, MD;  Location: [facility];  Service: Cardiovascular;  Laterality: N/A;   DIALYSIS/PERMA CATHETER REMOVAL N/A 08/15/2019   Procedure: DIALYSIS/PERMA CATHETER REMOVAL;  Surgeon: Katha Cabal, MD;  Location: [facility];  Service: Cardiovascular;  Laterality: N/A;   ESOPHAGOGASTRODUODENOSCOPY (EGD) WITH PROPOFOL N/A 12/27/2017   Procedure: ESOPHAGOGASTRODUODENOSCOPY (EGD) WITH PROPOFOL;  Surgeon: Toledo, Benay Pike, MD;  Location: [name] ENDOSCOPY;  Service: Gastroenterology;  Laterality: N/A;   INTRAVASCULAR ULTRASOUND/IVUS N/A 02/26/2020   Procedure: Intravascular Ultrasound/IVUS;  Surgeon: Jettie Booze, MD;  Location: [facility];  Service: Cardiovascular;  Laterality: N/A;   LEFT HEART CATH AND CORONARY ANGIOGRAPHY N/A 02/26/2020   Procedure: LEFT HEART CATH AND CORONARY ANGIOGRAPHY;  Surgeon: Jettie Booze, MD;  Location: [facility];  Service: Cardiovascular;  Laterality: N/A;   PROSTATE SURGERY  2016   TONSILECTOMY/ADENOIDECTOMY WITH MYRINGOTOMY     TONSILLECTOMY         Family History  Problem Relation Age of Onset   CAD Father    Stroke  Father    Diabetes Mellitus II Mother    Kidney failure Mother    Schizophrenia Mother     Social History   Tobacco Use   Smoking status: Never   Smokeless tobacco: Never  Vaping Use   Vaping Use: Never used  Substance Use Topics   Alcohol use: No   Drug use: No    Home Medications Prior to Admission medications   Medication Sig Start Date End Date Taking? Authorizing Provider  aspirin 81 MG chewable tablet CHEW 1 TABLET (81 MG TOTAL) BY MOUTH DAILY. 02/28/20 02/27/21  Cheryln Manly, NP  atorvastatin (LIPITOR) 80 MG tablet TAKE 1 TABLET (80 MG TOTAL) BY MOUTH AT BEDTIME. 02/28/20 02/27/21  Reino Bellis B, NP  carvedilol (COREG) 25 MG tablet Take 25 mg by mouth every morning. 08/15/19   [provider]  clopidogrel (PLAVIX) 75 MG tablet START PLAVIX AND TAKE 300 MG ON 2/7, THEN ON 2/8 TAKE PLAVIX 75 MG DAILY. 03/13/20   Tommie Raymond, NP  dorzolamide-timolol (COSOPT) 22.3-6.8 MG/ML ophthalmic solution Place 1 drop into the left eye 2 (two) times daily. 11/28/19 11/27/20  [provider]  lidocaine-prilocaine (EMLA) cream Apply 1 application topically Every Tuesday,Thursday,and Saturday with dialysis.  08/15/19   [provider]  nitroGLYCERIN (NITROSTAT) 0.4 MG SL tablet PLACE 1 TABLET (0.4 MG TOTAL) UNDER THE TONGUE EVERY FIVE MINUTES AS NEEDED. 02/28/20 02/27/21  Reino Bellis B, NP  pantoprazole (PROTONIX) 40 MG tablet Take 1 tablet (40 mg total) by mouth daily. 03/13/20 03/13/21  Kathyrn Drown D, NP  prednisoLONE acetate (PRED FORTE) 1 % ophthalmic suspension Place 1 drop into the left eye every 4 (four) hours.    [provider]  tamsulosin (FLOMAX) 0.4 MG CAPS capsule Take 0.4 mg by mouth daily after breakfast.     [provider]    Allergies    Levofloxacin, Promethazine, Other, and Malt  Review of Systems   Review of Systems  Constitutional:  Negative for chills and fever.  HENT:  Negative for congestion, rhinorrhea and sore throat.   Respiratory:  Negative for cough and shortness of breath.   Cardiovascular:  Negative for chest pain.  Gastrointestinal:  Positive for diarrhea, nausea and vomiting. Negative for abdominal distention, abdominal pain, anal bleeding, blood in stool and constipation.  Genitourinary:        Patient is anuric  Musculoskeletal:  Negative for back pain and neck pain.  Skin:  Negative for color change and rash.  Allergic/Immunologic: Positive for immunocompromised state.  Neurological:   Positive for weakness and light-headedness. Negative for dizziness, tremors, seizures, syncope, facial asymmetry, speech difficulty, numbness and headaches.  Psychiatric/Behavioral:  Negative for confusion.    Physical Exam Updated Vital Signs BP (!) 151/69 (BP Location: Right Arm)   Pulse 82   Temp (!) 97.3 F (36.3 C)   Resp 18   Ht '6\' 3"'$  (1.905 m)   Wt 92 kg   SpO2 98%   BMI 25.35 kg/m   Physical Exam Vitals and nursing note reviewed.  Constitutional:      General: He is not in acute distress.    Appearance: He is not ill-appearing, toxic-appearing or diaphoretic.  HENT:     Head: Normocephalic.  Eyes:     General: No scleral icterus.       Right eye: No discharge.        Left eye: No discharge.  Cardiovascular:     Rate and Rhythm: Normal rate.  Pulmonary:     Effort: Pulmonary effort is normal. No bradypnea or respiratory distress.     Breath sounds: Normal breath sounds. No stridor.  Abdominal:     General: Bowel sounds are normal. There is no distension. There are no signs of injury.     Palpations: Abdomen is soft. There is no mass or pulsatile mass.     Tenderness: There is no abdominal tenderness. There is no right CVA tenderness, left CVA tenderness, guarding or rebound.     Hernia: There is no hernia in the umbilical area or ventral area.  Musculoskeletal:     Cervical back: Neck supple.     Right lower leg: No swelling, tenderness or bony tenderness. No edema.     Left lower leg: No swelling, tenderness or bony tenderness. No edema.     Comments: Fistula to right upper arm, palpable thrill noted.  No surrounding erythema, rash, or swelling.  Skin:    General: Skin is warm and dry.  Neurological:     General: No focal deficit present.     Mental Status: He is alert.  Psychiatric:        Behavior: Behavior is cooperative.    ED Results / Procedures / Treatments   Labs (all labs ordered are listed, but only abnormal results are displayed) Labs Reviewed   COMPREHENSIVE METABOLIC PANEL - Abnormal; Notable for the following components:      Result Value   Potassium 2.7 (*)    CO2 19 (*)    Glucose, Bld 124 (*)    BUN 44 (*)    Creatinine, Ser 12.15 (*)    Calcium 8.3 (*)    Albumin 3.3 (*)    AST 7 (*)    GFR, Estimated 5 (*)    All other components within normal limits  CBC WITH DIFFERENTIAL/PLATELET - Abnormal; Notable for the following components:   Hemoglobin 10.8 (*)    HCT 34.2 (*)    MCV 77.2 (*)    MCH 24.4 (*)    RDW 17.1 (*)    All other components within normal limits  MAGNESIUM    EKG EKG Interpretation  Date/Time:  Monday September 15 2020 11:38:42 EDT Ventricular Rate:  81 PR Interval:  227 QRS Duration: 103 QT Interval:  399 QTC Calculation: 464 R Axis:   44 Text Interpretation: Sinus rhythm Prolonged PR interval Biatrial enlargement Borderline T wave abnormalities Baseline wander in lead(s) I III aVL Artifact Confirmed by Fredia Sorrow [phone]) on 09/15/2020 11:43:46 AM  Radiology No results found.  Procedures Procedures   Medications Ordered in ED Medications  potassium chloride 10 mEq in 100 mL IVPB (0 mEq Intravenous Stopped 09/15/20 1406)  sodium chloride 0.9 % bolus 500 mL (0 mLs Intravenous Stopped 09/15/20 1406)  potassium chloride SA (KLOR-CON) CR tablet 40 mEq (40 mEq Oral Given 09/15/20 1134)  losartan (COZAAR) tablet 25 mg (25 mg Oral Given 09/15/20 1259)    ED Course  I have reviewed the triage vital signs and the nursing notes.  Pertinent labs & imaging results that were available during my care of the patient were reviewed by me and considered in my medical decision making (see chart for details).  Clinical Course as of 09/15/20 2238  Mon Sep 15, 2020  1052 Potassium(!!): 2.7 We will give patient 40 mg Of oral potassium [PB]  1254 Patient noted to be hypertensive.  Patient states that he normally takes losartan and carvedilol for his blood pressure after dialysis.  Patient did not take  these medications today.  We will give patient home dose of both medications.  [PB]  [id number] Spoke to Drew with nephrology who advised that patient will need to have a predialysis potassium checked on Friday.  Does not need any further potassium repletion, or to be brought in for monitoring of his potassium. [PB]    Clinical Course User Index [PB] Dyann Ruddle   MDM Rules/Calculators/A&P                           Alert 50 year old male in no acute distress, nontoxic-appearing.  Presents to emergency department with chief complaint of lightheadedness and "dehydration." Patient is a dialysis patient, last received dialysis earlier this morning.  Will obtain CBG, basic lab work.  We will check orthostatic vital signs.  Will give patient 500 mL fluid bolus.  CBC shows anemia with hemoglobin of 10.8, appears to be stable and at patient's baseline. CMP shows elevated BUN and creatinine, history of patient's baseline and likely secondary to his end-stage renal disease.  Patient also noted to be hypokalemic at 2.7.  We will give patient potassium repletion due to concerns of worsening hypokalemia secondary to vomiting and diarrhea will obtain EKG to evaluate for changes secondary to hyperkalemia.  EKG shows normal, changes consistent with hypokalemia.  Patient was able to handle potassium without difficulties.  Patient  I will consult nephrology due to patient's hypokalemia and end-stage renal disease.  Improvement in blood pressure after receiving home medications.  Patient denies any chest pain, shortness of breath, visual disturbance, numbness, weakness.Suspicion for hypertensive emergency.  Discussed results, findings, treatment and follow up. Patient advised of return precautions. Patient verbalized understanding and agreed with plan.    Final Clinical Impression(s) / ED Diagnoses Final diagnoses:  Generalized weakness  Hypokalemia    Rx / DC Orders ED Discharge  Orders     None        Loni Beckwith, PA-C 09/15/20 2244    Fredia Sorrow, MD 09/19/20 [phone]

## 2020-09-15 NOTE — ED Triage Notes (Signed)
Pt just left dialysis and feel like he needs fluids. Pt states he has gastroperisis and is feeling weak.  Pt states he is feeling dizzy and unable to eat and not feeling well.

## 2020-09-15 NOTE — Telephone Encounter (Signed)
   Primary Cardiologist: Larae Grooms, MD  Chart reviewed as part of pre-operative protocol coverage. Given past medical history and time since last visit, based on ACC/AHA guidelines, [name] Zaydrian Gloe. would be at acceptable risk for the planned procedure without further cardiovascular testing.   He may hold his aspirin and Plavix 7 days prior to his procedure.  Please resume as soon as hemostasis is achieved.  Patient was advised that if he develops new symptoms prior to surgery to contact our office to arrange a follow-up appointment.  He verbalized understanding.  I will route this recommendation to the requesting party via Epic fax function and remove from pre-op pool.  Please call with questions.  [name] [name] Schoffstall NP-C    09/15/2020, 2:52 PM [facility] [address] [address] Office [phone] Fax [phone]

## 2020-09-15 NOTE — Telephone Encounter (Signed)
Pt is on Plavix and Aspirin.  Joelene Millin said "Eliquis" to me and we discussed Plavix & Aspirin with her, but accidentally typed Eliquis anyway.  But it's Plavix.

## 2020-11-06 ENCOUNTER — Encounter (HOSPITAL_COMMUNITY): Payer: Self-pay | Admitting: *Deleted

## 2020-11-06 ENCOUNTER — Other Ambulatory Visit: Payer: Self-pay

## 2020-11-06 ENCOUNTER — Telehealth: Payer: Self-pay | Admitting: Interventional Cardiology

## 2020-11-06 ENCOUNTER — Emergency Department (HOSPITAL_COMMUNITY): Payer: Medicare Other

## 2020-11-06 ENCOUNTER — Emergency Department (HOSPITAL_COMMUNITY)
Admission: EM | Admit: 2020-11-06 | Discharge: 2020-11-06 | Disposition: A | Discharge status: Home / Self Care | Payer: Medicare Other | Attending: Emergency Medicine | Admitting: Emergency Medicine

## 2020-11-06 DIAGNOSIS — I132 Hypertensive heart and chronic kidney disease with heart failure and with stage 5 chronic kidney disease, or end stage renal disease: Secondary | ICD-10-CM | POA: Diagnosis not present

## 2020-11-06 DIAGNOSIS — E1122 Type 2 diabetes mellitus with diabetic chronic kidney disease: Secondary | ICD-10-CM | POA: Insufficient documentation

## 2020-11-06 DIAGNOSIS — N186 End stage renal disease: Secondary | ICD-10-CM | POA: Insufficient documentation

## 2020-11-06 DIAGNOSIS — M7989 Other specified soft tissue disorders: Secondary | ICD-10-CM | POA: Insufficient documentation

## 2020-11-06 DIAGNOSIS — M79652 Pain in left thigh: Secondary | ICD-10-CM | POA: Insufficient documentation

## 2020-11-06 DIAGNOSIS — I5032 Chronic diastolic (congestive) heart failure: Secondary | ICD-10-CM | POA: Diagnosis not present

## 2020-11-06 DIAGNOSIS — Z992 Dependence on renal dialysis: Secondary | ICD-10-CM | POA: Diagnosis not present

## 2020-11-06 DIAGNOSIS — D631 Anemia in chronic kidney disease: Secondary | ICD-10-CM | POA: Diagnosis not present

## 2020-11-06 DIAGNOSIS — Z7982 Long term (current) use of aspirin: Secondary | ICD-10-CM | POA: Diagnosis not present

## 2020-11-06 DIAGNOSIS — E1140 Type 2 diabetes mellitus with diabetic neuropathy, unspecified: Secondary | ICD-10-CM | POA: Insufficient documentation

## 2020-11-06 DIAGNOSIS — Z7902 Long term (current) use of antithrombotics/antiplatelets: Secondary | ICD-10-CM | POA: Diagnosis not present

## 2020-11-06 LAB — CBC WITH DIFFERENTIAL/PLATELET
Abs Immature Granulocytes: 0.06 10*3/uL (ref 0.00–0.07)
Basophils Absolute: 0.1 10*3/uL (ref 0.0–0.1)
Basophils Relative: 1 %
Eosinophils Absolute: 0.4 10*3/uL (ref 0.0–0.5)
Eosinophils Relative: 4 %
HCT: 32.7 % — ABNORMAL LOW (ref 39.0–52.0)
Hemoglobin: 9.5 g/dL — ABNORMAL LOW (ref 13.0–17.0)
Immature Granulocytes: 1 %
Lymphocytes Relative: 15 %
Lymphs Abs: 1.4 10*3/uL (ref 0.7–4.0)
MCH: 23.8 pg — ABNORMAL LOW (ref 26.0–34.0)
MCHC: 29.1 g/dL — ABNORMAL LOW (ref 30.0–36.0)
MCV: 82 fL (ref 80.0–100.0)
Monocytes Absolute: 1.2 10*3/uL — ABNORMAL HIGH (ref 0.1–1.0)
Monocytes Relative: 13 %
Neutro Abs: 6.4 10*3/uL (ref 1.7–7.7)
Neutrophils Relative %: 66 %
Platelets: 198 10*3/uL (ref 150–400)
RBC: 3.99 MIL/uL — ABNORMAL LOW (ref 4.22–5.81)
RDW: 18.6 % — ABNORMAL HIGH (ref 11.5–15.5)
WBC: 9.6 10*3/uL (ref 4.0–10.5)
nRBC: 0 % (ref 0.0–0.2)

## 2020-11-06 LAB — BASIC METABOLIC PANEL
Anion gap: 10 (ref 5–15)
BUN: 63 mg/dL — ABNORMAL HIGH (ref 6–20)
CO2: 13 mmol/L — ABNORMAL LOW (ref 22–32)
Calcium: 8 mg/dL — ABNORMAL LOW (ref 8.9–10.3)
Chloride: 115 mmol/L — ABNORMAL HIGH (ref 98–111)
Creatinine, Ser: 14.18 mg/dL — ABNORMAL HIGH (ref 0.61–1.24)
GFR, Estimated: 4 mL/min — ABNORMAL LOW (ref >60)
Glucose, Bld: 80 mg/dL (ref 70–99)
Potassium: 4.7 mmol/L (ref 3.5–5.1)
Sodium: 138 mmol/L (ref 135–145)

## 2020-11-06 MED ORDER — DOXYCYCLINE HYCLATE 100 MG PO TABS
100.0000 mg | ORAL_TABLET | Freq: Once | ORAL | Status: AC
  Administered 2020-11-06: 100 mg via ORAL
  Filled 2020-11-06: qty 1

## 2020-11-06 MED ORDER — ACETAMINOPHEN 500 MG PO TABS
1000.0000 mg | ORAL_TABLET | Freq: Once | ORAL | Status: AC
  Administered 2020-11-06: 1000 mg via ORAL
  Filled 2020-11-06: qty 2

## 2020-11-06 MED ORDER — IOHEXOL 300 MG/ML  SOLN
100.0000 mL | Freq: Once | INTRAMUSCULAR | Status: AC | PRN
  Administered 2020-11-06: 100 mL via INTRAVENOUS

## 2020-11-06 MED ORDER — ONDANSETRON HCL 4 MG/2ML IJ SOLN
4.0000 mg | Freq: Once | INTRAMUSCULAR | Status: AC
  Administered 2020-11-06: 4 mg via INTRAVENOUS
  Filled 2020-11-06: qty 2

## 2020-11-06 MED ORDER — MORPHINE SULFATE (PF) 4 MG/ML IV SOLN
4.0000 mg | Freq: Once | INTRAVENOUS | Status: DC
  Filled 2020-11-06: qty 1

## 2020-11-06 MED ORDER — DOXYCYCLINE HYCLATE 100 MG PO CAPS: 100.0000 mg | ORAL_CAPSULE | Freq: Two times a day (BID) | ORAL | 0 refills | Status: DC

## 2020-11-06 NOTE — ED Provider Notes (Signed)
   Patient signed out to me by Evalee Jefferson, PA-C pending CT femur results.  Patient here with localized pain and swelling of the left medial upper thigh x2 weeks.  He was seen by PCP today and referred here for further evaluation of a possible abscess.  On my exam, patient resting comfortably no acute distress noted.  Vital signs are reassuring.  CT of the left femur shows subcutaneous stranding without abscess or drainable fluid collection.  His labs show no leukocytosis.  He has a serum creatinine of 14 which is baseline.  He is a dialysis patient and scheduled for dialysis tomorrow.  Afebrile.  I have discussed CT and lab findings with the patient.  Low clinical suspicion for necrotizing fasciitis.  We will start patient on oral antibiotics and he is agreeable to close follow-up with general surgery.  All questions were answered, patient was given strict return precautions as well.  CT FEMUR LEFT W CONTRAST  Result Date: 11/06/2020 CLINICAL DATA:  Left thigh abscess EXAM: CT OF THE LOWER RIGHT EXTREMITY WITH CONTRAST TECHNIQUE: Multidetector CT imaging of the lower right extremity was performed according to the standard protocol following intravenous contrast administration. CONTRAST:  15m OMNIPAQUE IOHEXOL 300 MG/ML  SOLN COMPARISON:  None. FINDINGS: Subcutaneous stranding in the posteromedial left gluteal region/upper thigh (series 6/image 85). Additional intramuscular and subcutaneous edema in the medial thigh (series 6/image 159). No drainable fluid collection/abscess. No focal osseous lesions.  No fracture or dislocation is seen. Left hip joint space is preserved. IMPRESSION: Subcutaneous stranding with intramuscular/subcutaneous edema in the left gluteal region and medial thigh. No drainable fluid collection/abscess. Electronically Signed   By: SJulian HyM.D.   On: 11/06/2020 19:29    Labs Reviewed  CBC WITH DIFFERENTIAL/PLATELET - Abnormal; Notable for the following components:       Result Value   RBC 3.99 (*)    Hemoglobin 9.5 (*)    HCT 32.7 (*)    MCH 23.8 (*)    MCHC 29.1 (*)    RDW 18.6 (*)    Monocytes Absolute 1.2 (*)    All other components within normal limits  BASIC METABOLIC PANEL - Abnormal; Notable for the following components:   Chloride 115 (*)    CO2 13 (*)    BUN 63 (*)    Creatinine, Ser 14.18 (*)    Calcium 8.0 (*)    GFR, Estimated 4 (*)    All other components within normal limits       TKem Parkinson PA-C 11/06/20 2017    ZFredia Sorrow MD 11/14/20 [phone]

## 2020-11-06 NOTE — Telephone Encounter (Signed)
Patient returning call from the Nurse

## 2020-11-06 NOTE — Telephone Encounter (Signed)
Patient said he can hear his heartbeat. When he sits still its like a pulsing in his ear. It doesn't happen all the time. He is not sure what to do. Please advise

## 2020-11-06 NOTE — Discharge Instructions (Addendum)
Your CT this evening did not show a drainable fluid collection or abscess.  This may still be an infection.  You have been prescribed antibiotics, please take the medication as directed until it is finished.  You may apply warm wet compresses on and off to the affected area 2-3 times a day.  Call the surgeon listed to arrange a follow-up appointment.  Return to the emergency department for any new or worsening symptoms.

## 2020-11-06 NOTE — ED Notes (Signed)
ED Provider at bedside. 

## 2020-11-06 NOTE — Telephone Encounter (Signed)
Left message to call office

## 2020-11-06 NOTE — ED Provider Notes (Signed)
[facility] EMERGENCY DEPARTMENT Provider Note   CSN: [id number] Arrival date & time: 11/06/20  1328     History No chief complaint on file.   [name]. is a 50 y.o. 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  There has been no drainage from the site, he denies any rash, wounds or papules of his skin in this region prior to onset of the swelling.  It has been present for about 2 weeks.  He was seen by his PCP today and was referred here for further evaluation of a suspected abscess.  He denies fevers or chills, nausea or vomiting, no other complaints except for this localized pain.  He has found no alleviators for symptoms.  The history is provided by the patient.      Past Medical History:  Diagnosis Date   Anxiety    Cellulitis    CHF (congestive heart failure) (HCC)    Chronic diarrhea    Chronic kidney disease    Chronic pain of both ankles    Diabetes mellitus without complication (HCC)    Diabetes mellitus, type II ([place])    Diabetic neuropathy (HCC)    Frequent falls    Gait instability    GERD (gastroesophageal reflux disease)    Heart palpitations    History of kidney stones    HLD (hyperlipidemia)    Hypertension    Insomnia    Nausea and vomiting in adult    recurrent    Patient Active Problem List   Diagnosis Date Noted   Abnormal gait 08/19/2020   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[place]) 08/19/2020   Anxiety state 08/19/2020   Callosity 08/19/2020   ED (erectile dysfunction) of organic origin 08/19/2020   Heart failure ([place]) 08/19/2020   Long term (current) use of insulin ([place]) 08/19/2020   Myalgia 08/19/2020   Obstructive uropathy 08/19/2020    Reactive depression 08/19/2020   Old myocardial infarction 08/19/2020   ESRD on dialysis ([place]) 08/19/2020   Blind painful left eye 08/15/2020   Combined forms of age-related cataract of right eye 04/17/2020   Supraventricular tachycardia ([place]) 02/26/2020   Acute anterior wall MI ([place])    Benign prostatic hyperplasia with lower urinary tract symptoms 11/07/2019   Fluid overload, unspecified 08/15/2019   Hypertension secondary to other renal disorders 07/20/2019   NVG (neovascular glaucoma), left, 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   Symptomatic anemia 06/05/2019   Aftercare including intermittent dialysis ([place]) 05/15/2019   Moderate protein-calorie malnutrition ([place]) 05/14/2019   Anaphylactic reaction due to adverse effect of correct drug or medicament properly administered, initial encounter 05/08/2019   Anemia in chronic kidney disease 05/07/2019   Chest pain, unspecified 05/07/2019   Coagulation defect, unspecified ([place]) 05/07/2019   Disorder of phosphorus metabolism, unspecified 05/07/2019   Encounter for fitting and adjustment of extracorporeal dialysis catheter ([place]) 05/07/2019   Gastroparesis 05/07/2019   Iron deficiency anemia, unspecified 05/07/2019   Major depressive disorder, recurrent, unspecified ([place]) 05/07/2019   Pain, unspecified 05/07/2019   Pruritus, unspecified 05/07/2019   Dizziness Q000111Q   Metabolic acidosis Q000111Q   Syncope 04/06/2019   Urinary retention 04/06/2019   Hypertensive chronic kidney disease with stage 1 through stage 4 chronic kidney disease,  or unspecified chronic kidney disease 11/28/2018   Edema 11/28/2018   Hypokalemia 11/28/2018   Hypo-osmolality and hyponatremia 11/28/2018   Proteinuria 11/28/2018   Secondary hyperparathyroidism of renal origin ([place]) 11/28/2018   Lymphedema 01/09/2018   Chronic diastolic heart failure ([place]) 12/13/2017   Vomiting 11/28/2017   Acute on chronic heart failure ([place]) 11/23/2017   Type 2 diabetes mellitus  with stage 4 chronic kidney disease, with long-term current use of insulin ([place]) 11/23/2017   Diabetic retinopathy ([place]) 07/05/2017   Uncontrolled diabetes mellitus ([place]) 07/08/2016   Uncontrolled type 2 diabetes mellitus with hyperglycemia, with long-term current use of insulin ([place]) 04/20/2016   Osteomyelitis of toe of left foot ([place]) 03/15/2016   Acute renal failure with tubular necrosis ([place]) 06/23/2015   Diarrhea 06/23/2015   Mixed hyperlipidemia    Essential hypertension, benign    Heart palpitations 05/06/2015    Past Surgical History:  Procedure Laterality Date   AMPUTATION Left 03/16/2016   Procedure: AMPUTATION DIGIT LEFT HALLUX;  Surgeon: Trula Slade, DPM;  Location: [place];  Service: Podiatry;  Laterality: Left;  can start around 5    ARTHROSCOPIC REPAIR ACL Left    AV FISTULA PLACEMENT Right 05/31/2019   Procedure: Brachiocephalic AV fistula creation;  Surgeon: Algernon Huxley, MD;  Location: [name] ORS;  Service: Vascular;  Laterality: Right;   COLONOSCOPY WITH PROPOFOL N/A 10/28/2015   Procedure: COLONOSCOPY WITH PROPOFOL;  Surgeon: Lollie Sails, MD;  Location: [facility] ENDOSCOPY;  Service: Endoscopy;  Laterality: N/A;   COLONOSCOPY WITH PROPOFOL N/A 10/29/2015   Procedure: COLONOSCOPY WITH PROPOFOL;  Surgeon: Lollie Sails, MD;  Location: [facility] ENDOSCOPY;  Service: Endoscopy;  Laterality: N/A;   CORONARY/GRAFT ACUTE MI REVASCULARIZATION N/A 02/26/2020   Procedure: Coronary/Graft Acute MI Revascularization;  Surgeon: Jettie Booze, MD;  Location: [place] CV LAB;  Service: Cardiovascular;  Laterality: N/A;   DIALYSIS/PERMA CATHETER INSERTION Right 04/26/2019   Perm Cath    DIALYSIS/PERMA CATHETER INSERTION N/A 04/26/2019   Procedure: DIALYSIS/PERMA CATHETER INSERTION;  Surgeon: Algernon Huxley, MD;  Location: [place] CV LAB;  Service: Cardiovascular;  Laterality: N/A;   DIALYSIS/PERMA CATHETER REMOVAL N/A 08/15/2019   Procedure: DIALYSIS/PERMA CATHETER REMOVAL;   Surgeon: Katha Cabal, MD;  Location: [place] CV LAB;  Service: Cardiovascular;  Laterality: N/A;   ESOPHAGOGASTRODUODENOSCOPY (EGD) WITH PROPOFOL N/A 12/27/2017   Procedure: ESOPHAGOGASTRODUODENOSCOPY (EGD) WITH PROPOFOL;  Surgeon: Tol[place], Benay Pike, MD;  Location: [name] ENDOSCOPY;  Service: Gastroenterology;  Laterality: N/A;   INTRAVASCULAR ULTRASOUND/IVUS N/A 02/26/2020   Procedure: Intravascular Ultrasound/IVUS;  Surgeon: Jettie Booze, MD;  Location: [place] CV LAB;  Service: Cardiovascular;  Laterality: N/A;   LEFT HEART CATH AND CORONARY ANGIOGRAPHY N/A 02/26/2020   Procedure: LEFT HEART CATH AND CORONARY ANGIOGRAPHY;  Surgeon: Jettie Booze, MD;  Location: [place] CV LAB;  Service: Cardiovascular;  Laterality: N/A;   PROSTATE SURGERY  2016   TONSILECTOMY/ADENOIDECTOMY WITH MYRINGOTOMY     TONSILLECTOMY         Family History  Problem Relation Age of Onset   CAD Father    Stroke Father    Diabetes Mellitus II Mother    Kidney failure Mother    Schizophrenia Mother     Social History   Tobacco Use   Smoking status: Never   Smokeless tobacco: Never  Vaping Use   Vaping Use: Never used  Substance Use Topics   Alcohol use: No   Drug use: No    Home Medications  Prior to Admission medications   Medication Sig Start Date End Date Taking? Authorizing Provider  aspirin 81 MG chewable tablet CHEW 1 TABLET (81 MG TOTAL) BY MOUTH DAILY. Patient taking differently: Chew 81 mg by mouth daily. 02/28/20 02/27/21  Cheryln Manly, NP  atorvastatin (LIPITOR) 80 MG tablet TAKE 1 TABLET (80 MG TOTAL) BY MOUTH AT BEDTIME. Patient taking differently: Take 80 mg by mouth at bedtime. 02/28/20 02/27/21  Cheryln Manly, NP  azelastine (ASTELIN) 0.1 % nasal spray Place 2 sprays into both nostrils 2 (two) times daily. 09/08/20   [provider]  carvedilol (COREG) 25 MG tablet Take 25 mg by mouth every morning. 08/15/19   [provider]  clopidogrel  (PLAVIX) 75 MG tablet START PLAVIX AND TAKE 300 MG ON 2/7, THEN ON 2/8 TAKE PLAVIX 75 MG DAILY. 03/13/20   Tommie Raymond, NP  dorzolamide-timolol (COSOPT) 22.3-6.8 MG/ML ophthalmic solution Place 1 drop into the right eye 2 (two) times daily. 11/28/19 11/27/20  [provider]  FEROSUL 325 (65 Fe) MG tablet Take 325 mg by mouth daily. 09/08/20   [provider]  fluticasone (FLONASE) 50 MCG/ACT nasal spray Place 2 sprays into both nostrils daily. 09/08/20   [provider]  lidocaine-prilocaine (EMLA) cream Apply 1 application topically Every Tuesday,Thursday,and Saturday with dialysis.  08/15/19   [provider]  nitroGLYCERIN (NITROSTAT) 0.4 MG SL tablet PLACE 1 TABLET (0.4 MG TOTAL) UNDER THE TONGUE EVERY FIVE MINUTES AS NEEDED. Patient taking differently: Place 0.4 mg under the tongue every 5 (five) minutes as needed for chest pain. 02/28/20 02/27/21  Cheryln Manly, NP  omeprazole (PRILOSEC) 40 MG capsule Take 40 mg by mouth 2 (two) times daily. 09/08/20   [provider]  pantoprazole (PROTONIX) 40 MG tablet Take 1 tablet (40 mg total) by mouth daily. 03/13/20 03/13/21  Kathyrn Drown D, NP  prednisoLONE acetate (PRED FORTE) 1 % ophthalmic suspension Place 1 drop into the left eye every 4 (four) hours.    [provider]  tamsulosin (FLOMAX) 0.4 MG CAPS capsule Take 0.4 mg by mouth daily after breakfast.     [provider]    Allergies    Levofloxacin, Promethazine, Other, and Malt  Review of Systems   Review of Systems  Constitutional:  Negative for chills and fever.  HENT: Negative.    Eyes: Negative.   Respiratory:  Negative for chest tightness and shortness of breath.   Cardiovascular:  Negative for chest pain.  Gastrointestinal:  Negative for abdominal pain, nausea and vomiting.  Genitourinary: Negative.  Negative for dysuria, scrotal swelling and testicular pain.  Musculoskeletal:  Negative for arthralgias, joint swelling  and neck pain.  Skin: Negative.  Negative for rash and wound.       Negative except as mentioned in HPI.    Neurological:  Negative for dizziness, weakness, light-headedness, numbness and headaches.  Psychiatric/Behavioral: Negative.     Physical Exam Updated Vital Signs BP (!) 178/85   Pulse 71   Temp 98.3 F (36.8 C) (Oral)   Resp 16   SpO2 100%   Physical Exam Vitals and nursing note reviewed.  Constitutional:      Appearance: He is well-developed.  HENT:     Head: Normocephalic and atraumatic.  Eyes:     Conjunctiva/sclera: Conjunctivae normal.  Cardiovascular:     Rate and Rhythm: Normal rate and regular rhythm.     Heart sounds: Normal heart sounds.  Pulmonary:     Effort:  Pulmonary effort is normal.     Breath sounds: Normal breath sounds. No wheezing.  Abdominal:     General: Bowel sounds are normal.     Palpations: Abdomen is soft.     Tenderness: There is no abdominal tenderness.  Musculoskeletal:        General: Swelling and tenderness present. Normal range of motion.     Cervical back: Normal range of motion.     Comments: Indurated tender mass left upper medial thigh.  His skin is indurated at the site, there is no clear papule, pustule or other cutaneous defect.  Skin:    General: Skin is warm and dry.  Neurological:     Mental Status: He is alert.    ED Results / Procedures / Treatments   Labs (all labs ordered are listed, but only abnormal results are displayed) Labs Reviewed  CBC WITH DIFFERENTIAL/PLATELET - Abnormal; Notable for the following components:      Result Value   RBC 3.99 (*)    Hemoglobin 9.5 (*)    HCT 32.7 (*)    MCH 23.8 (*)    MCHC 29.1 (*)    RDW 18.6 (*)    Monocytes Absolute 1.2 (*)    All other components within normal limits  BASIC METABOLIC PANEL - Abnormal; Notable for the following components:   Chloride 115 (*)    CO2 13 (*)    BUN 63 (*)    Creatinine, Ser 14.18 (*)    Calcium 8.0 (*)    GFR, Estimated 4 (*)     All other components within normal limits    EKG None  Radiology No results found.  Procedures Procedures   Medications Ordered in ED Medications  morphine 4 MG/ML injection 4 mg (4 mg Intravenous Not Given 11/06/20 1736)  ondansetron (ZOFRAN) injection 4 mg (4 mg Intravenous Given 11/06/20 1735)  acetaminophen (TYLENOL) tablet 1,000 mg (1,000 mg Oral Given 11/06/20 1808)  iohexol (OMNIPAQUE) 300 MG/ML solution 100 mL (100 mLs Intravenous Contrast Given 11/06/20 1831)    ED Course  I have reviewed the triage vital signs and the nursing notes.  Pertinent labs & imaging results that were available during my care of the patient were reviewed by me and considered in my medical decision making (see chart for details).    MDM Rules/Calculators/A&P                           Patient with possible abscess in left upper medial thigh, but is unclear on exam.  There is no surface lesion suggesting a papular pustule as the initial nidus if this is an infection.  There is some mild warmth at the site, no obvious cellulitis.  Pending CT imaging at this time.  Discussed with Kem Parkinson, PA-C who assumes care. Final Clinical Impression(s) / ED Diagnoses Final diagnoses:  None    Rx / DC Orders ED Discharge Orders     None        Landis Martins 11/06/20 1924    Fredia Sorrow, MD 11/14/20 [phone]

## 2020-11-06 NOTE — Telephone Encounter (Signed)
   [name] Pre-operative Risk Assessment    Patient Name: [name].  DOB: 07/02/1970 MRN: [id number]  HEARTCARE STAFF:  - IMPORTANT!!!!!! Under Visit Info/Reason for Call, type in Other and utilize the format Clearance MM/DD/YY or Clearance TBD. Do not use dashes or single digits. - Please review there is not already an duplicate clearance open for this procedure. - If request is for dental extraction, please clarify the # of teeth to be extracted. - If the patient is currently at the dentist's office, call Pre-Op Callback Staff (MA/nurse) to input urgent request.  - If the patient is not currently in the dentist office, please route to the Pre-Op pool.  Request for surgical clearance:  What type of surgery is being performed? 1 tooth extraction  When is this surgery scheduled? TBD  What type of clearance is required (medical clearance vs. Pharmacy clearance to hold med vs. Both)? Both  Are there any medications that need to be held prior to surgery and how long? They are unsure what medication's the patient is taking  Practice name and name of physician performing surgery? [facility], Dr. Harriette Ohara  What is the office phone number? [phone]   7.   What is the office fax number? [phone]  8.   Anesthesia type (None, local, MAC, general) ? local   Selena Zobro 11/06/2020, 3:08 PM  _________________________________________________________________   (provider comments below)   Dentist office requesting information on the patient to do an extraction. They need to know any known allergies, medications he is on, and medical conditions he has. The patient was not able to give them any of the information, but mentioned he may be on a blood thinner.

## 2020-11-06 NOTE — ED Triage Notes (Signed)
Abscess to left inner thigh area for 2 weeks, referred from [facility]

## 2020-11-06 NOTE — ED Notes (Signed)
Pt driving, requesting pain med that will allow patient to drive. Pt unable to get a driver.

## 2020-11-06 NOTE — ED Notes (Signed)
Pt provided discharge instructions and prescription information. Pt was given the opportunity to ask questions and questions were answered. Discharge signature not obtained in the setting of the COVID-19 pandemic in order to reduce high touch surfaces.  ° °

## 2020-11-07 ENCOUNTER — Emergency Department (HOSPITAL_COMMUNITY)
Admission: EM | Admit: 2020-11-07 | Discharge: 2020-11-07 | Disposition: A | Discharge status: Home / Self Care | Payer: Medicare Other | Attending: Emergency Medicine | Admitting: Emergency Medicine

## 2020-11-07 ENCOUNTER — Emergency Department (HOSPITAL_COMMUNITY): Payer: Medicare Other

## 2020-11-07 ENCOUNTER — Encounter (HOSPITAL_COMMUNITY): Payer: Self-pay | Admitting: *Deleted

## 2020-11-07 ENCOUNTER — Other Ambulatory Visit: Payer: Self-pay

## 2020-11-07 DIAGNOSIS — R531 Weakness: Secondary | ICD-10-CM | POA: Diagnosis present

## 2020-11-07 DIAGNOSIS — Z7982 Long term (current) use of aspirin: Secondary | ICD-10-CM | POA: Diagnosis not present

## 2020-11-07 DIAGNOSIS — D631 Anemia in chronic kidney disease: Secondary | ICD-10-CM | POA: Diagnosis not present

## 2020-11-07 DIAGNOSIS — Z955 Presence of coronary angioplasty implant and graft: Secondary | ICD-10-CM | POA: Diagnosis not present

## 2020-11-07 DIAGNOSIS — E1122 Type 2 diabetes mellitus with diabetic chronic kidney disease: Secondary | ICD-10-CM | POA: Insufficient documentation

## 2020-11-07 DIAGNOSIS — E1140 Type 2 diabetes mellitus with diabetic neuropathy, unspecified: Secondary | ICD-10-CM | POA: Diagnosis not present

## 2020-11-07 DIAGNOSIS — I480 Paroxysmal atrial fibrillation: Secondary | ICD-10-CM | POA: Diagnosis not present

## 2020-11-07 DIAGNOSIS — I5032 Chronic diastolic (congestive) heart failure: Secondary | ICD-10-CM | POA: Diagnosis not present

## 2020-11-07 DIAGNOSIS — Z992 Dependence on renal dialysis: Secondary | ICD-10-CM | POA: Insufficient documentation

## 2020-11-07 DIAGNOSIS — N186 End stage renal disease: Secondary | ICD-10-CM | POA: Diagnosis not present

## 2020-11-07 DIAGNOSIS — E11319 Type 2 diabetes mellitus with unspecified diabetic retinopathy without macular edema: Secondary | ICD-10-CM | POA: Diagnosis not present

## 2020-11-07 DIAGNOSIS — Z79899 Other long term (current) drug therapy: Secondary | ICD-10-CM | POA: Insufficient documentation

## 2020-11-07 DIAGNOSIS — I132 Hypertensive heart and chronic kidney disease with heart failure and with stage 5 chronic kidney disease, or end stage renal disease: Secondary | ICD-10-CM | POA: Diagnosis not present

## 2020-11-07 LAB — COMPREHENSIVE METABOLIC PANEL
ALT: 7 U/L (ref 0–44)
AST: 6 U/L — ABNORMAL LOW (ref 15–41)
Albumin: 3.1 g/dL — ABNORMAL LOW (ref 3.5–5.0)
Alkaline Phosphatase: 70 U/L (ref 38–126)
Anion gap: 13 (ref 5–15)
BUN: 29 mg/dL — ABNORMAL HIGH (ref 6–20)
CO2: 22 mmol/L (ref 22–32)
Calcium: 8.3 mg/dL — ABNORMAL LOW (ref 8.9–10.3)
Chloride: 102 mmol/L (ref 98–111)
Creatinine, Ser: 7.09 mg/dL — ABNORMAL HIGH (ref 0.61–1.24)
GFR, Estimated: 9 mL/min — ABNORMAL LOW (ref >60)
Glucose, Bld: 81 mg/dL (ref 70–99)
Potassium: 3.2 mmol/L — ABNORMAL LOW (ref 3.5–5.1)
Sodium: 137 mmol/L (ref 135–145)
Total Bilirubin: 1 mg/dL (ref 0.3–1.2)
Total Protein: 6.1 g/dL — ABNORMAL LOW (ref 6.5–8.1)

## 2020-11-07 LAB — CBC WITH DIFFERENTIAL/PLATELET
Abs Immature Granulocytes: 0.03 10*3/uL (ref 0.00–0.07)
Basophils Absolute: 0.1 10*3/uL (ref 0.0–0.1)
Basophils Relative: 1 %
Eosinophils Absolute: 0.2 10*3/uL (ref 0.0–0.5)
Eosinophils Relative: 2 %
HCT: 30.6 % — ABNORMAL LOW (ref 39.0–52.0)
Hemoglobin: 9.4 g/dL — ABNORMAL LOW (ref 13.0–17.0)
Immature Granulocytes: 0 %
Lymphocytes Relative: 8 %
Lymphs Abs: 0.7 10*3/uL (ref 0.7–4.0)
MCH: 24.2 pg — ABNORMAL LOW (ref 26.0–34.0)
MCHC: 30.7 g/dL (ref 30.0–36.0)
MCV: 78.9 fL — ABNORMAL LOW (ref 80.0–100.0)
Monocytes Absolute: 1 10*3/uL (ref 0.1–1.0)
Monocytes Relative: 13 %
Neutro Abs: 6.2 10*3/uL (ref 1.7–7.7)
Neutrophils Relative %: 76 %
Platelets: 175 10*3/uL (ref 150–400)
RBC: 3.88 MIL/uL — ABNORMAL LOW (ref 4.22–5.81)
RDW: 18.4 % — ABNORMAL HIGH (ref 11.5–15.5)
WBC: 8.1 10*3/uL (ref 4.0–10.5)
nRBC: 0 % (ref 0.0–0.2)

## 2020-11-07 LAB — TROPONIN I (HIGH SENSITIVITY)
Troponin I (High Sensitivity): 22 ng/L — ABNORMAL HIGH (ref <18)
Troponin I (High Sensitivity): 30 ng/L — ABNORMAL HIGH (ref <18)

## 2020-11-07 MED ORDER — DILTIAZEM HCL 25 MG/5ML IV SOLN
5.0000 mg | Freq: Once | INTRAVENOUS | Status: AC
  Administered 2020-11-07: 5 mg via INTRAVENOUS
  Filled 2020-11-07: qty 5

## 2020-11-07 MED ORDER — DILTIAZEM HCL-DEXTROSE 125-5 MG/125ML-% IV SOLN (PREMIX)
5.0000 mg/h | INTRAVENOUS | Status: DC
  Filled 2020-11-07: qty 125

## 2020-11-07 MED ORDER — AMIODARONE HCL 200 MG PO TABS
200.0000 mg | ORAL_TABLET | Freq: Once | ORAL | Status: AC
  Administered 2020-11-07: 200 mg via ORAL
  Filled 2020-11-07: qty 1

## 2020-11-07 MED ORDER — DILTIAZEM LOAD VIA INFUSION
10.0000 mg | Freq: Once | INTRAVENOUS | Status: DC
Start: 2020-11-07 — End: 2020-11-07
  Filled 2020-11-07: qty 10

## 2020-11-07 MED ORDER — AMIODARONE HCL 200 MG PO TABS: ORAL_TABLET | ORAL | 0 refills | Status: DC

## 2020-11-07 MED ORDER — SODIUM CHLORIDE 0.9 % IV BOLUS
500.0000 mL | Freq: Once | INTRAVENOUS | Status: AC
  Administered 2020-11-07: 500 mL via INTRAVENOUS

## 2020-11-07 NOTE — ED Triage Notes (Signed)
Went to dialysis with BP at [id number] systolic after pt had dialysis BP was noted to be in the 60's; pt was given 800 bolus of fluid at dialysis and 250 with ems and BP back in the low 100's; pt c/o left this pain

## 2020-11-07 NOTE — Discharge Instructions (Addendum)
Follow-up with your cardiologist in 1 to 2 weeks.

## 2020-11-07 NOTE — Telephone Encounter (Signed)
   Patient Name: [name].  DOB: 1970/10/04 MRN: [id number]  Primary Cardiologist: Larae Grooms, MD  Chart reviewed as part of pre-operative protocol coverage.  Patient has history of DM2, ESRD on hemodialysis, hypertension, hyperlipidemia, gastroparesis, diastolic dysfunction.  Simple dental extractions are considered low risk procedures per guidelines and generally do not require any specific cardiac clearance. It is also generally accepted that for simple extractions and dental cleanings, there is no need to interrupt blood thinner therapy.  Based on his chart, no SBE prophylaxis needed. It is of note that the patient has been admitted today with cardiology rounding this afternoon.   Will route to requesting party and removed from preop pool.  Please call if questions.  Arvil Chaco, PA-C 11/07/2020, 3:20 PM

## 2020-11-07 NOTE — ED Provider Notes (Signed)
[facility] Provider Note   CSN: [id number] Arrival date & time: 11/07/20  1031     History Chief Complaint  Patient presents with   Hypotension    [name]. is a 50 y.o. male.  Patient was sent over here from dialysis because of low blood pressure.  He was given 800 cc in dialysis and 200 by the paramedics and when he got here his systolic blood pressure was over 100  The history is provided by the patient and medical records. No language interpreter was used.  Weakness Severity:  Moderate Onset quality:  Sudden Timing:  Constant Progression:  Waxing and waning Chronicity:  New Context: not alcohol use   Relieved by:  Nothing Worsened by:  Nothing Ineffective treatments:  None tried Associated symptoms: no abdominal pain, no chest pain, no cough, no diarrhea, no frequency, no headaches and no seizures       Past Medical History:  Diagnosis Date   Anxiety    Cellulitis    CHF (congestive heart failure) (HCC)    Chronic diarrhea    Chronic kidney disease    Chronic pain of both ankles    Diabetes mellitus without complication (HCC)    Diabetes mellitus, type II (HCC)    Diabetic neuropathy (HCC)    Frequent falls    Gait instability    GERD (gastroesophageal reflux disease)    Heart palpitations    History of kidney stones    HLD (hyperlipidemia)    Hypertension    Insomnia    Nausea and vomiting in adult    recurrent    Patient Active Problem List   Diagnosis Date Noted   Abnormal gait 08/19/2020   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[place]) 08/19/2020   Anxiety state 08/19/2020   Callosity 08/19/2020   ED (erectile dysfunction) of organic origin 08/19/2020   Heart failure ([place]) 08/19/2020   Long term (current) use of insulin ([place]) 08/19/2020   Myalgia 08/19/2020   Obstructive uropathy 08/19/2020   Reactive depression 08/19/2020   Old myocardial infarction 08/19/2020   ESRD on dialysis ([place]) 08/19/2020   Blind painful  left eye 08/15/2020   Combined forms of age-related cataract of right eye 04/17/2020   Supraventricular tachycardia ([place]) 02/26/2020   Acute anterior wall MI ([place])    Benign prostatic hyperplasia with lower urinary tract symptoms 11/07/2019   Fluid overload, unspecified 08/15/2019   Hypertension secondary to other renal disorders 07/20/2019   NVG (neovascular glaucoma), left, 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   Symptomatic anemia 06/05/2019   Aftercare including intermittent dialysis ([place]) 05/15/2019   Moderate protein-calorie malnutrition ([place]) 05/14/2019   Anaphylactic reaction due to adverse effect of correct drug or medicament properly administered, initial encounter 05/08/2019   Anemia in chronic kidney disease 05/07/2019   Chest pain, unspecified 05/07/2019   Coagulation defect, unspecified ([place]) 05/07/2019   Disorder of phosphorus metabolism, unspecified 05/07/2019   Encounter for fitting and adjustment of extracorporeal dialysis catheter ([place]) 05/07/2019   Gastroparesis 05/07/2019   Iron deficiency anemia, unspecified 05/07/2019   Major depressive disorder, recurrent, unspecified ([place]) 05/07/2019   Pain, unspecified 05/07/2019   Pruritus, unspecified 05/07/2019   Dizziness Q000111Q   Metabolic acidosis Q000111Q   Syncope 04/06/2019   Urinary retention 04/06/2019   Hypertensive chronic kidney disease with stage 1 through stage 4 chronic kidney disease, or unspecified chronic kidney disease 11/28/2018   Edema 11/28/2018   Hypokalemia 11/28/2018   Hypo-osmolality and hyponatremia 11/28/2018   Proteinuria 11/28/2018  Secondary hyperparathyroidism of renal origin ([place]) 11/28/2018   Lymphedema 01/09/2018   Chronic diastolic heart failure ([place]) 12/13/2017   Vomiting 11/28/2017   Acute on chronic heart failure ([place]) 11/23/2017   Type 2 diabetes mellitus with stage 4 chronic kidney disease, with long-term current use of insulin ([place]) 11/23/2017   Diabetic retinopathy ([place])  07/05/2017   Uncontrolled diabetes mellitus ([place]) 07/08/2016   Uncontrolled type 2 diabetes mellitus with hyperglycemia, with long-term current use of insulin ([place]) 04/20/2016   Osteomyelitis of toe of left foot ([place]) 03/15/2016   Acute renal failure with tubular necrosis ([place]) 06/23/2015   Diarrhea 06/23/2015   Mixed hyperlipidemia    Essential hypertension, benign    Heart palpitations 05/06/2015    Past Surgical History:  Procedure Laterality Date   AMPUTATION Left 03/16/2016   Procedure: AMPUTATION DIGIT LEFT HALLUX;  Surgeon: Trula Slade, DPM;  Location: [facility];  Service: Podiatry;  Laterality: Left;  can start around 5    ARTHROSCOPIC REPAIR ACL Left    AV FISTULA PLACEMENT Right 05/31/2019   Procedure: Brachiocephalic AV fistula creation;  Surgeon: Algernon Huxley, MD;  Location: [name];  Service: Vascular;  Laterality: Right;   COLONOSCOPY WITH PROPOFOL N/A 10/28/2015   Procedure: COLONOSCOPY WITH PROPOFOL;  Surgeon: Lollie Sails, MD;  Location: [facility] ENDOSCOPY;  Service: Endoscopy;  Laterality: N/A;   COLONOSCOPY WITH PROPOFOL N/A 10/29/2015   Procedure: COLONOSCOPY WITH PROPOFOL;  Surgeon: Lollie Sails, MD;  Location: [facility] ENDOSCOPY;  Service: Endoscopy;  Laterality: N/A;   CORONARY/GRAFT ACUTE MI REVASCULARIZATION N/A 02/26/2020   Procedure: Coronary/Graft Acute MI Revascularization;  Surgeon: Jettie Booze, MD;  Location: [facility];  Service: Cardiovascular;  Laterality: N/A;   DIALYSIS/PERMA CATHETER INSERTION Right 04/26/2019   Perm Cath    DIALYSIS/PERMA CATHETER INSERTION N/A 04/26/2019   Procedure: DIALYSIS/PERMA CATHETER INSERTION;  Surgeon: Algernon Huxley, MD;  Location: [facility];  Service: Cardiovascular;  Laterality: N/A;   DIALYSIS/PERMA CATHETER REMOVAL N/A 08/15/2019   Procedure: DIALYSIS/PERMA CATHETER REMOVAL;  Surgeon: Katha Cabal, MD;  Location: [facility];  Service: Cardiovascular;  Laterality: N/A;    ESOPHAGOGASTRODUODENOSCOPY (EGD) WITH PROPOFOL N/A 12/27/2017   Procedure: ESOPHAGOGASTRODUODENOSCOPY (EGD) WITH PROPOFOL;  Surgeon: Toledo, Benay Pike, MD;  Location: [name] ENDOSCOPY;  Service: Gastroenterology;  Laterality: N/A;   INTRAVASCULAR ULTRASOUND/IVUS N/A 02/26/2020   Procedure: Intravascular Ultrasound/IVUS;  Surgeon: Jettie Booze, MD;  Location: [facility];  Service: Cardiovascular;  Laterality: N/A;   LEFT HEART CATH AND CORONARY ANGIOGRAPHY N/A 02/26/2020   Procedure: LEFT HEART CATH AND CORONARY ANGIOGRAPHY;  Surgeon: Jettie Booze, MD;  Location: [facility];  Service: Cardiovascular;  Laterality: N/A;   PROSTATE SURGERY  2016   TONSILECTOMY/ADENOIDECTOMY WITH MYRINGOTOMY     TONSILLECTOMY         Family History  Problem Relation Age of Onset   CAD Father    Stroke Father    Diabetes Mellitus II Mother    Kidney failure Mother    Schizophrenia Mother     Social History   Tobacco Use   Smoking status: Never   Smokeless tobacco: Never  Vaping Use   Vaping Use: Never used  Substance Use Topics   Alcohol use: No   Drug use: No    Home Medications Prior to Admission medications   Medication Sig Start Date End Date Taking? Authorizing Provider  amiodarone (PACERONE) 200 MG tablet Take 200 mg twice a  day for 1 week and then start taking 200 mg once a day 11/07/20  Yes Milton Ferguson, MD  aspirin 81 MG chewable tablet CHEW 1 TABLET (81 MG TOTAL) BY MOUTH DAILY. Patient taking differently: Chew 81 mg by mouth daily. 02/28/20 02/27/21 Yes Cheryln Manly, NP  atorvastatin (LIPITOR) 80 MG tablet TAKE 1 TABLET (80 MG TOTAL) BY MOUTH AT BEDTIME. Patient taking differently: Take 80 mg by mouth at bedtime. 02/28/20 02/27/21 Yes Cheryln Manly, NP  azelastine (ASTELIN) 0.1 % nasal spray Place 2 sprays into both nostrils 2 (two) times daily. 09/08/20  Yes [provider]  carvedilol (COREG) 25 MG tablet Take 25 mg by mouth every morning. 08/15/19   Yes [provider]  clopidogrel (PLAVIX) 75 MG tablet START PLAVIX AND TAKE 300 MG ON 2/7, THEN ON 2/8 TAKE PLAVIX 75 MG DAILY. Patient taking differently: Take 75 mg by mouth daily. START PLAVIX AND TAKE 300 MG ON 2/7, THEN ON 2/8 TAKE PLAVIX 75 MG DAILY. 03/13/20  Yes Kathyrn Drown D, NP  dorzolamide-timolol (COSOPT) 22.3-6.8 MG/ML ophthalmic solution Place 1 drop into the right eye 2 (two) times daily. 11/28/19 11/27/20 Yes [provider]  doxycycline (VIBRAMYCIN) 100 MG capsule Take 1 capsule (100 mg total) by mouth 2 (two) times daily. 11/06/20  Yes Triplett, Tammy, PA-C  FEROSUL 325 (65 Fe) MG tablet Take 325 mg by mouth daily. 09/08/20  Yes [provider]  fluticasone (FLONASE) 50 MCG/ACT nasal spray Place 2 sprays into both nostrils daily. 09/08/20  Yes [provider]  lidocaine-prilocaine (EMLA) cream Apply 1 application topically Every Tuesday,Thursday,and Saturday with dialysis.  08/15/19  Yes [provider]  nitroGLYCERIN (NITROSTAT) 0.4 MG SL tablet PLACE 1 TABLET (0.4 MG TOTAL) UNDER THE TONGUE EVERY FIVE MINUTES AS NEEDED. Patient taking differently: Place 0.4 mg under the tongue every 5 (five) minutes as needed for chest pain. 02/28/20 02/27/21 Yes Cheryln Manly, NP  omeprazole (PRILOSEC) 40 MG capsule Take 40 mg by mouth 2 (two) times daily. 09/08/20  Yes [provider]  pantoprazole (PROTONIX) 40 MG tablet Take 1 tablet (40 mg total) by mouth daily. 03/13/20 03/13/21 Yes Kathyrn Drown D, NP  prednisoLONE acetate (PRED FORTE) 1 % ophthalmic suspension Place 1 drop into the left eye every 4 (four) hours.   Yes [provider]  tamsulosin (FLOMAX) 0.4 MG CAPS capsule Take 0.4 mg by mouth daily after breakfast.    Yes [provider]    Allergies    Levofloxacin, Promethazine, Other, and Malt  Review of Systems   Review of Systems  Constitutional:  Negative for appetite change and fatigue.  HENT:  Negative for  congestion, ear discharge and sinus pressure.   Eyes:  Negative for discharge.  Respiratory:  Negative for cough.   Cardiovascular:  Negative for chest pain.  Gastrointestinal:  Negative for abdominal pain and diarrhea.  Genitourinary:  Negative for frequency and hematuria.  Musculoskeletal:  Negative for back pain.  Skin:  Negative for rash.  Neurological:  Positive for weakness. Negative for seizures and headaches.  Psychiatric/Behavioral:  Negative for hallucinations.    Physical Exam Updated Vital Signs BP (!) 198/93   Pulse 81   Temp 97.9 F (36.6 C) (Oral)   Resp 17   Ht '6\' 3"'$  (1.905 m)   Wt 90.7 kg   SpO2 98%   BMI 25.00 kg/m   Physical Exam Vitals and nursing note reviewed.  Constitutional:      Appearance: He  is well-developed.  HENT:     Head: Normocephalic.     Nose: Nose normal.  Eyes:     General: No scleral icterus.    Conjunctiva/sclera: Conjunctivae normal.  Neck:     Thyroid: No thyromegaly.  Cardiovascular:     Rate and Rhythm: Tachycardia present. Rhythm irregular.     Heart sounds: No murmur heard.   No friction rub. No gallop.  Pulmonary:     Breath sounds: No stridor. No wheezing or rales.  Chest:     Chest wall: No tenderness.  Abdominal:     General: There is no distension.     Tenderness: There is no abdominal tenderness. There is no rebound.  Musculoskeletal:        General: Normal range of motion.     Cervical back: Neck supple.  Lymphadenopathy:     Cervical: No cervical adenopathy.  Skin:    Findings: No erythema or rash.  Neurological:     Mental Status: He is alert and oriented to person, place, and time.     Motor: No abnormal muscle tone.     Coordination: Coordination normal.  Psychiatric:        Behavior: Behavior normal.    ED Results / Procedures / Treatments   Labs (all labs ordered are listed, but only abnormal results are displayed) Labs Reviewed  CBC WITH DIFFERENTIAL/PLATELET - Abnormal; Notable for the  following components:      Result Value   RBC 3.88 (*)    Hemoglobin 9.4 (*)    HCT 30.6 (*)    MCV 78.9 (*)    MCH 24.2 (*)    RDW 18.4 (*)    All other components within normal limits  COMPREHENSIVE METABOLIC PANEL - Abnormal; Notable for the following components:   Potassium 3.2 (*)    BUN 29 (*)    Creatinine, Ser 7.09 (*)    Calcium 8.3 (*)    Total Protein 6.1 (*)    Albumin 3.1 (*)    AST 6 (*)    GFR, Estimated 9 (*)    All other components within normal limits  TROPONIN I (HIGH SENSITIVITY) - Abnormal; Notable for the following components:   Troponin I (High Sensitivity) 22 (*)    All other components within normal limits  TROPONIN I (HIGH SENSITIVITY) - Abnormal; Notable for the following components:   Troponin I (High Sensitivity) 30 (*)    All other components within normal limits    EKG None  Radiology CT FEMUR LEFT W CONTRAST  Result Date: 11/06/2020 CLINICAL DATA:  Left thigh abscess EXAM: CT OF THE LOWER RIGHT EXTREMITY WITH CONTRAST TECHNIQUE: Multidetector CT imaging of the lower right extremity was performed according to the standard protocol following intravenous contrast administration. CONTRAST:  139m OMNIPAQUE IOHEXOL 300 MG/ML  SOLN COMPARISON:  None. FINDINGS: Subcutaneous stranding in the posteromedial left gluteal region/upper thigh (series 6/image 85). Additional intramuscular and subcutaneous edema in the medial thigh (series 6/image 159). No drainable fluid collection/abscess. No focal osseous lesions.  No fracture or dislocation is seen. Left hip joint space is preserved. IMPRESSION: Subcutaneous stranding with intramuscular/subcutaneous edema in the left gluteal region and medial thigh. No drainable fluid collection/abscess. Electronically Signed   By: SJulian HyM.D.   On: 11/06/2020 19:29   DG Chest Port 1 View  Result Date: 11/07/2020 CLINICAL DATA:  Weakness EXAM: PORTABLE CHEST 1 VIEW COMPARISON:  Chest radiograph 02/26/2020 FINDINGS:  The heart is significantly enlarged, similar to the  prior study. The mediastinal contours are stable. There is vascular congestion without overt pulmonary edema. There is no focal consolidation. There is no pleural effusion or pneumothorax. There is no acute osseous abnormality. IMPRESSION: Unchanged marked cardiomegaly with vascular congestion but no overt pulmonary edema. Electronically Signed   By: Valetta Mole M.D.   On: 11/07/2020 12:20    Procedures Procedures   Medications Ordered in ED Medications  diltiazem (CARDIZEM) 1 mg/mL load via infusion 10 mg (has no administration in time range)    And  diltiazem (CARDIZEM) 125 mg in dextrose 5% 125 mL (1 mg/mL) infusion (has no administration in time range)  amiodarone (PACERONE) tablet 200 mg (has no administration in time range)  sodium chloride 0.9 % bolus 500 mL (0 mLs Intravenous Stopped 11/07/20 1226)  diltiazem (CARDIZEM) injection 5 mg (5 mg Intravenous Given 11/07/20 1211)    ED Course  I have reviewed the triage vital signs and the nursing notes.  Pertinent labs & imaging results that were available during my care of the patient were reviewed by me and considered in my medical decision making (see chart for details). CRITICAL CARE Performed by: Milton Ferguson Total critical care time: 45 minutes Critical care time was exclusive of separately billable procedures and treating other patients. Critical care was necessary to treat or prevent imminent or life-threatening deterioration. Critical care was time spent personally by me on the following activities: development of treatment plan with patient and/or surrogate as well as nursing, discussions with consultants, evaluation of patient's response to treatment, examination of patient, obtaining history from patient or surrogate, ordering and performing treatments and interventions, ordering and review of laboratory studies, ordering and review of radiographic studies, pulse oximetry and  re-evaluation of patient's condition.  Patient with hypotension and new onset atrial fibs that resolved with 5 mg of Cardizem.  I spoke with cardiology who recommended starting the patient on amiodarone.  Patient will be started on 200 mg twice a day for a week and then 200 mg once a day.  He will follow-up with his cardiologist MDM Rules/Calculators/A&P                           Patient with hypotension from dialysis with atrial fib for short period of time.  His hypotension has been resolved with fluids and his atrial fibs also resolved.  He is started on amiodarone on recommendations from the cardiologist.  He will be discharged home follow-up with cardiology Final Clinical Impression(s) / ED Diagnoses Final diagnoses:  Paroxysmal atrial fibrillation ([place])    Rx / DC Orders ED Discharge Orders          Ordered    amiodarone (PACERONE) 200 MG tablet        11/07/20 1414             Milton Ferguson, MD 11/10/20 1642

## 2020-11-07 NOTE — Progress Notes (Signed)
Discussed case by phone with Dr. Roderic Palau.  Mr. Merklinger was sent from the hemodialysis center today with hypotension at end of session, improved with IV fluids.  He was noted to be in atrial fibrillation on evaluation in the ER, given a single dose of IV diltiazem 5 mg and spontaneously converted to sinus rhythm confirmed by follow-up ECG.  He has a previous history of atrial fibrillation in the setting of LAD stenosis back in January of this year.  He underwent DES to the LAD at that time and has been on aspirin and Plavix.  Primary cardiologist is Dr. Irish Lack.  CHA2DS2-VASc score is 4, he has not been anticoagulated long-term as yet.  I personally reviewed the tracings from today, significant ST segment depression noted while in atrial fibrillation, improved back in sinus rhythm.  High-sensitivity troponin I levels are not diagnostic for ACS, 22 and 30 despite rapid atrial fibrillation and in the presence of ESRD.  Home medications include aspirin, Plavix, Coreg, and Lipitor.  I recommended to Dr. Roderic Palau initiation of oral amiodarone for rhythm suppression as he has had now had 2 episodes of symptomatic atrial fibrillation since January and associated ischemic changes by ECG.  Would start amiodarone 200 mg twice daily for a week and then reduce to 200 mg daily, relatively low loading dose.  Otherwise continue Coreg.  He needs to follow-up with Dr. Irish Lack or APP in the next 1 to 2 weeks for further discussion.  May need to consider initiation of DOAC at this time given recurrent arrhythmia, possibly changing regimen to Eliquis plus Plavix.  Satira Sark, M.D., F.A.C.C.

## 2020-11-11 NOTE — Telephone Encounter (Signed)
Patient seen in ED on 9/15 and 9/16

## 2020-11-14 ENCOUNTER — Telehealth: Payer: Self-pay | Admitting: Interventional Cardiology

## 2020-11-14 NOTE — Telephone Encounter (Signed)
I spoke with patient.  He reports his heart rate was low at dialysis on Wednesday.  Does not check his heart rate at home but feels woozy at times.  Feels a "thump" in his chest at times and feels his heart is beating slow.  This makes him cough. Reports at dialysis on Wednesday heart rate was as low as 51. Other times during dialysis it was running 55-57.  BP was 149-179/60-80.  Was recently started on amiodarone for afib.  Currently taking 200 mg daily.  Is taking Coreg as listed. He reports feeling bad during last hour of dialysis and is going to talk with doctor about decreasing dialysis time.  He is scheduled to go to dialysis today but is concerned about going due to low heart rate.  I advised him to go to dialysis as scheduled.  I told patient I would make Dr Irish Lack aware of his heart rate issues and we would call him back once reviewed.

## 2020-11-14 NOTE — Telephone Encounter (Signed)
I spoke with patient and gave him information from Dr Varanasi 

## 2020-11-14 NOTE — Telephone Encounter (Signed)
Decrease carvedilol to 12.5 mg just before his dialysis treatments.  Otherwise, can continue the 25 mg dose at other times.   HR in the 50s is ok.  Let us know if it is going lower than that.

## 2020-11-14 NOTE — Telephone Encounter (Signed)
STAT if HR is under 50 or over 120 (normal HR is 60-100 beats per minute)  What is your heart rate?  At dialysis the other day it was 13  Do you have a log of your heart rate readings (document readings)?  No pt does not keep a log  Do you have any other symptoms? His heart is beating too slow, pt is feeling weak

## 2020-11-24 ENCOUNTER — Emergency Department: Payer: Medicare Other

## 2020-11-24 ENCOUNTER — Inpatient Hospital Stay
Admission: EM | Admit: 2020-11-24 | Discharge: 2020-11-27 | DRG: 252 | Disposition: A | Discharge status: Home / Self Care | Payer: Medicare Other | Attending: Internal Medicine | Admitting: Internal Medicine

## 2020-11-24 ENCOUNTER — Other Ambulatory Visit: Payer: Self-pay

## 2020-11-24 DIAGNOSIS — I96 Gangrene, not elsewhere classified: Secondary | ICD-10-CM

## 2020-11-24 DIAGNOSIS — E1152 Type 2 diabetes mellitus with diabetic peripheral angiopathy with gangrene: Secondary | ICD-10-CM | POA: Diagnosis present

## 2020-11-24 DIAGNOSIS — L089 Local infection of the skin and subcutaneous tissue, unspecified: Secondary | ICD-10-CM

## 2020-11-24 DIAGNOSIS — K219 Gastro-esophageal reflux disease without esophagitis: Secondary | ICD-10-CM | POA: Diagnosis present

## 2020-11-24 DIAGNOSIS — G9341 Metabolic encephalopathy: Secondary | ICD-10-CM | POA: Diagnosis present

## 2020-11-24 DIAGNOSIS — Z833 Family history of diabetes mellitus: Secondary | ICD-10-CM

## 2020-11-24 DIAGNOSIS — I132 Hypertensive heart and chronic kidney disease with heart failure and with stage 5 chronic kidney disease, or end stage renal disease: Secondary | ICD-10-CM | POA: Diagnosis present

## 2020-11-24 DIAGNOSIS — D649 Anemia, unspecified: Secondary | ICD-10-CM

## 2020-11-24 DIAGNOSIS — I480 Paroxysmal atrial fibrillation: Secondary | ICD-10-CM

## 2020-11-24 DIAGNOSIS — L03116 Cellulitis of left lower limb: Secondary | ICD-10-CM

## 2020-11-24 DIAGNOSIS — L03011 Cellulitis of right finger: Secondary | ICD-10-CM | POA: Diagnosis present

## 2020-11-24 DIAGNOSIS — Z992 Dependence on renal dialysis: Secondary | ICD-10-CM

## 2020-11-24 DIAGNOSIS — Z823 Family history of stroke: Secondary | ICD-10-CM

## 2020-11-24 DIAGNOSIS — R55 Syncope and collapse: Secondary | ICD-10-CM

## 2020-11-24 DIAGNOSIS — I998 Other disorder of circulatory system: Secondary | ICD-10-CM | POA: Diagnosis present

## 2020-11-24 DIAGNOSIS — I5032 Chronic diastolic (congestive) heart failure: Secondary | ICD-10-CM | POA: Diagnosis present

## 2020-11-24 DIAGNOSIS — N400 Enlarged prostate without lower urinary tract symptoms: Secondary | ICD-10-CM | POA: Diagnosis present

## 2020-11-24 DIAGNOSIS — R7989 Other specified abnormal findings of blood chemistry: Secondary | ICD-10-CM

## 2020-11-24 DIAGNOSIS — R296 Repeated falls: Secondary | ICD-10-CM

## 2020-11-24 DIAGNOSIS — Z955 Presence of coronary angioplasty implant and graft: Secondary | ICD-10-CM

## 2020-11-24 DIAGNOSIS — I2510 Atherosclerotic heart disease of native coronary artery without angina pectoris: Secondary | ICD-10-CM | POA: Diagnosis present

## 2020-11-24 DIAGNOSIS — I472 Ventricular tachycardia, unspecified: Secondary | ICD-10-CM | POA: Diagnosis not present

## 2020-11-24 DIAGNOSIS — E11319 Type 2 diabetes mellitus with unspecified diabetic retinopathy without macular edema: Secondary | ICD-10-CM | POA: Diagnosis present

## 2020-11-24 DIAGNOSIS — I4891 Unspecified atrial fibrillation: Secondary | ICD-10-CM

## 2020-11-24 DIAGNOSIS — T827XXA Infection and inflammatory reaction due to other cardiac and vascular devices, implants and grafts, initial encounter: Secondary | ICD-10-CM | POA: Diagnosis not present

## 2020-11-24 DIAGNOSIS — E782 Mixed hyperlipidemia: Secondary | ICD-10-CM | POA: Diagnosis present

## 2020-11-24 DIAGNOSIS — Y832 Surgical operation with anastomosis, bypass or graft as the cause of abnormal reaction of the patient, or of later complication, without mention of misadventure at the time of the procedure: Secondary | ICD-10-CM | POA: Diagnosis present

## 2020-11-24 DIAGNOSIS — E1165 Type 2 diabetes mellitus with hyperglycemia: Secondary | ICD-10-CM | POA: Diagnosis present

## 2020-11-24 DIAGNOSIS — E1143 Type 2 diabetes mellitus with diabetic autonomic (poly)neuropathy: Secondary | ICD-10-CM | POA: Diagnosis present

## 2020-11-24 DIAGNOSIS — Z794 Long term (current) use of insulin: Secondary | ICD-10-CM

## 2020-11-24 DIAGNOSIS — E119 Type 2 diabetes mellitus without complications: Secondary | ICD-10-CM

## 2020-11-24 DIAGNOSIS — N2581 Secondary hyperparathyroidism of renal origin: Secondary | ICD-10-CM | POA: Diagnosis present

## 2020-11-24 DIAGNOSIS — Z7982 Long term (current) use of aspirin: Secondary | ICD-10-CM

## 2020-11-24 DIAGNOSIS — I959 Hypotension, unspecified: Secondary | ICD-10-CM

## 2020-11-24 DIAGNOSIS — I3139 Other pericardial effusion (noninflammatory): Secondary | ICD-10-CM | POA: Diagnosis present

## 2020-11-24 DIAGNOSIS — W19XXXA Unspecified fall, initial encounter: Secondary | ICD-10-CM

## 2020-11-24 DIAGNOSIS — M869 Osteomyelitis, unspecified: Secondary | ICD-10-CM

## 2020-11-24 DIAGNOSIS — E1122 Type 2 diabetes mellitus with diabetic chronic kidney disease: Secondary | ICD-10-CM | POA: Diagnosis present

## 2020-11-24 DIAGNOSIS — E872 Acidosis, unspecified: Secondary | ICD-10-CM | POA: Diagnosis present

## 2020-11-24 DIAGNOSIS — R42 Dizziness and giddiness: Secondary | ICD-10-CM | POA: Insufficient documentation

## 2020-11-24 DIAGNOSIS — I10 Essential (primary) hypertension: Secondary | ICD-10-CM | POA: Diagnosis present

## 2020-11-24 DIAGNOSIS — Z8249 Family history of ischemic heart disease and other diseases of the circulatory system: Secondary | ICD-10-CM

## 2020-11-24 DIAGNOSIS — Z20822 Contact with and (suspected) exposure to covid-19: Secondary | ICD-10-CM | POA: Diagnosis present

## 2020-11-24 DIAGNOSIS — R778 Other specified abnormalities of plasma proteins: Secondary | ICD-10-CM

## 2020-11-24 DIAGNOSIS — Z79899 Other long term (current) drug therapy: Secondary | ICD-10-CM

## 2020-11-24 DIAGNOSIS — I252 Old myocardial infarction: Secondary | ICD-10-CM

## 2020-11-24 DIAGNOSIS — Z7902 Long term (current) use of antithrombotics/antiplatelets: Secondary | ICD-10-CM

## 2020-11-24 DIAGNOSIS — K3184 Gastroparesis: Secondary | ICD-10-CM | POA: Diagnosis present

## 2020-11-24 DIAGNOSIS — B999 Unspecified infectious disease: Secondary | ICD-10-CM

## 2020-11-24 DIAGNOSIS — D631 Anemia in chronic kidney disease: Secondary | ICD-10-CM | POA: Diagnosis present

## 2020-11-24 DIAGNOSIS — A419 Sepsis, unspecified organism: Secondary | ICD-10-CM

## 2020-11-24 DIAGNOSIS — N186 End stage renal disease: Secondary | ICD-10-CM | POA: Diagnosis present

## 2020-11-24 DIAGNOSIS — I214 Non-ST elevation (NSTEMI) myocardial infarction: Secondary | ICD-10-CM

## 2020-11-24 LAB — APTT: aPTT: 35 seconds (ref 24–36)

## 2020-11-24 LAB — BASIC METABOLIC PANEL
Anion gap: 11 (ref 5–15)
BUN: 52 mg/dL — ABNORMAL HIGH (ref 6–20)
CO2: 18 mmol/L — ABNORMAL LOW (ref 22–32)
Calcium: 7.9 mg/dL — ABNORMAL LOW (ref 8.9–10.3)
Chloride: 113 mmol/L — ABNORMAL HIGH (ref 98–111)
Creatinine, Ser: 12.64 mg/dL — ABNORMAL HIGH (ref 0.61–1.24)
GFR, Estimated: 4 mL/min — ABNORMAL LOW (ref >60)
Glucose, Bld: 145 mg/dL — ABNORMAL HIGH (ref 70–99)
Potassium: 3.7 mmol/L (ref 3.5–5.1)
Sodium: 142 mmol/L (ref 135–145)

## 2020-11-24 LAB — PROTIME-INR
INR: 1.2 (ref 0.8–1.2)
Prothrombin Time: 15.6 seconds — ABNORMAL HIGH (ref 11.4–15.2)

## 2020-11-24 LAB — CBC WITH DIFFERENTIAL/PLATELET
Abs Immature Granulocytes: 0.05 10*3/uL (ref 0.00–0.07)
Basophils Absolute: 0 10*3/uL (ref 0.0–0.1)
Basophils Relative: 1 %
Eosinophils Absolute: 0.1 10*3/uL (ref 0.0–0.5)
Eosinophils Relative: 1 %
HCT: 29.3 % — ABNORMAL LOW (ref 39.0–52.0)
Hemoglobin: 9.2 g/dL — ABNORMAL LOW (ref 13.0–17.0)
Immature Granulocytes: 1 %
Lymphocytes Relative: 7 %
Lymphs Abs: 0.6 10*3/uL — ABNORMAL LOW (ref 0.7–4.0)
MCH: 23.7 pg — ABNORMAL LOW (ref 26.0–34.0)
MCHC: 31.4 g/dL (ref 30.0–36.0)
MCV: 75.3 fL — ABNORMAL LOW (ref 80.0–100.0)
Monocytes Absolute: 0.6 10*3/uL (ref 0.1–1.0)
Monocytes Relative: 7 %
Neutro Abs: 7.2 10*3/uL (ref 1.7–7.7)
Neutrophils Relative %: 83 %
Platelets: 243 10*3/uL (ref 150–400)
RBC: 3.89 MIL/uL — ABNORMAL LOW (ref 4.22–5.81)
RDW: 19.8 % — ABNORMAL HIGH (ref 11.5–15.5)
WBC: 8.5 10*3/uL (ref 4.0–10.5)
nRBC: 1.9 % — ABNORMAL HIGH (ref 0.0–0.2)

## 2020-11-24 LAB — TROPONIN I (HIGH SENSITIVITY)
Troponin I (High Sensitivity): 104 ng/L (ref <18)
Troponin I (High Sensitivity): 731 ng/L (ref <18)

## 2020-11-24 LAB — MAGNESIUM: Magnesium: 2 mg/dL (ref 1.7–2.4)

## 2020-11-24 LAB — LACTIC ACID, PLASMA
Lactic Acid, Venous: 2.6 mmol/L (ref 0.5–1.9)
Lactic Acid, Venous: 1.6 mmol/L (ref 0.5–1.9)

## 2020-11-24 LAB — RESP PANEL BY RT-PCR (FLU A&B, COVID) ARPGX2
Influenza A by PCR: NEGATIVE
Influenza B by PCR: NEGATIVE
SARS Coronavirus 2 by RT PCR: NEGATIVE

## 2020-11-24 LAB — PROCALCITONIN: Procalcitonin: 4.17 ng/mL

## 2020-11-24 MED ORDER — DORZOLAMIDE HCL-TIMOLOL MAL 2-0.5 % OP SOLN
1.0000 [drp] | Freq: Two times a day (BID) | OPHTHALMIC | Status: DC
  Administered 2020-11-25 – 2020-11-27 (×5): 1 [drp] via OPHTHALMIC
  Filled 2020-11-24: qty 10

## 2020-11-24 MED ORDER — SODIUM CHLORIDE 0.9 % IV SOLN
1.0000 g | INTRAVENOUS | Status: DC
  Administered 2020-11-25 – 2020-11-27 (×3): 1 g via INTRAVENOUS
  Filled 2020-11-24 (×2): qty 10
  Filled 2020-11-24 (×2): qty 1

## 2020-11-24 MED ORDER — ASPIRIN 81 MG PO CHEW
324.0000 mg | CHEWABLE_TABLET | Freq: Once | ORAL | Status: AC
  Administered 2020-11-24: 324 mg via ORAL
  Filled 2020-11-24: qty 4

## 2020-11-24 MED ORDER — HEPARIN (PORCINE) 25000 UT/250ML-% IV SOLN
1600.0000 [IU]/h | INTRAVENOUS | Status: DC
  Administered 2020-11-24: 1200 [IU]/h via INTRAVENOUS
  Administered 2020-11-25: 1450 [IU]/h via INTRAVENOUS
  Administered 2020-11-26: 1600 [IU]/h via INTRAVENOUS
  Filled 2020-11-24 (×4): qty 250

## 2020-11-24 MED ORDER — LACTATED RINGERS IV BOLUS: 500.0000 mL | Freq: Once | INTRAVENOUS | Status: DC

## 2020-11-24 MED ORDER — HEPARIN BOLUS VIA INFUSION
4000.0000 [IU] | Freq: Once | INTRAVENOUS | Status: AC
  Administered 2020-11-24: 4000 [IU] via INTRAVENOUS
  Filled 2020-11-24: qty 4000

## 2020-11-24 MED ORDER — ACETAMINOPHEN 325 MG PO TABS
650.0000 mg | ORAL_TABLET | ORAL | Status: DC | PRN
  Administered 2020-11-27: 650 mg via ORAL
  Filled 2020-11-24: qty 2

## 2020-11-24 MED ORDER — ASPIRIN 81 MG PO CHEW: 81.0000 mg | CHEWABLE_TABLET | Freq: Every day | ORAL | Status: DC

## 2020-11-24 MED ORDER — INSULIN ASPART 100 UNIT/ML IJ SOLN
0.0000 [IU] | Freq: Three times a day (TID) | INTRAMUSCULAR | Status: DC
  Filled 2020-11-24: qty 1

## 2020-11-24 MED ORDER — ASPIRIN EC 81 MG PO TBEC
81.0000 mg | DELAYED_RELEASE_TABLET | Freq: Every day | ORAL | Status: DC
  Administered 2020-11-25 – 2020-11-26 (×2): 81 mg via ORAL
  Filled 2020-11-24 (×2): qty 1

## 2020-11-24 MED ORDER — ONDANSETRON HCL 4 MG/2ML IJ SOLN: 4.0000 mg | Freq: Four times a day (QID) | INTRAMUSCULAR | Status: DC | PRN

## 2020-11-24 MED ORDER — DILTIAZEM LOAD VIA INFUSION
10.0000 mg | Freq: Once | INTRAVENOUS | Status: AC
  Administered 2020-11-24: 10 mg via INTRAVENOUS
  Filled 2020-11-24: qty 10

## 2020-11-24 MED ORDER — ATORVASTATIN CALCIUM 80 MG PO TABS
80.0000 mg | ORAL_TABLET | Freq: Every day | ORAL | Status: DC
  Administered 2020-11-25 – 2020-11-26 (×2): 80 mg via ORAL
  Filled 2020-11-24: qty 4
  Filled 2020-11-24: qty 1

## 2020-11-24 MED ORDER — NITROGLYCERIN 0.4 MG SL SUBL: 0.4000 mg | SUBLINGUAL_TABLET | SUBLINGUAL | Status: DC | PRN

## 2020-11-24 MED ORDER — INSULIN ASPART 100 UNIT/ML IJ SOLN: 0.0000 [IU] | Freq: Every day | INTRAMUSCULAR | Status: DC

## 2020-11-24 MED ORDER — DILTIAZEM HCL-DEXTROSE 125-5 MG/125ML-% IV SOLN (PREMIX)
5.0000 mg/h | INTRAVENOUS | Status: DC
  Administered 2020-11-24 – 2020-11-25 (×2): 5 mg/h via INTRAVENOUS
  Filled 2020-11-24: qty 125

## 2020-11-24 NOTE — ED Provider Notes (Signed)
Emergency Medicine Provider Triage Evaluation Note  [name] Siler Montemayor., a 50 y.o. male  was evaluated in triage. Pt arrives from home via EMS after dialysis.  Pt complains of weakness and low BPs at dialysis. He also reports an active cellulitis on the Right thigh being treated with Amoxicillin and Doxycycline. He would also endorse 2 mechanical falls in the last 2 days without head injury or LOC.  Review of Systems  Positive: Low BP, weakness Negative: CP, SOB  Physical Exam  There were no vitals taken for this visit. Gen:   Awake, no distress  NAD Resp:  Normal effort CTA MSK:   Moves extremities without difficulty  Other:  CVS: RRR  Medical Decision Making  Medically screening exam initiated at 4:23 PM.  Appropriate orders placed.  [name] [name]. was informed that the remainder of the evaluation will be completed by another provider, this initial triage assessment does not replace that evaluation, and the importance of remaining in the ED until their evaluation is complete.  Dialysis patient with ED evaluation low BP and weakness.    Melvenia Needles, PA-C 11/24/20 1635    Lucrezia Starch, MD 11/24/20 [name]

## 2020-11-24 NOTE — ED Notes (Signed)
Pt to CT

## 2020-11-24 NOTE — ED Provider Notes (Signed)
[facility] Emergency Department Provider Note  ____________________________________________   Event Date/Time   First MD Initiated Contact with Patient 11/24/20 2018     (approximate)  I have reviewed the triage vital signs and the nursing notes.   HISTORY  Chief Complaint Hypotension   HPI [name]. is a 50 y.o. male with a past medical history of anxiety, CHF, DM, GERD, HDL, HTN, ESRD on HD MWF having completed a partial session today as well as A. fib on amiodarone not anticoagulated, CAD, and PVD who presents for assessment of several complaints.  It seems that during dialysis today patient got lightheaded and dizzy and blacked out for a few minutes.  He states this happens happened on and off over the last couple weeks with dialysis.  States sometimes he will feel weak and dizzy when he is not at dialysis as well and has had several falls including today and yesterday.  States yesterday he thinks he fell walking in his house and hit the back of his head.  He is not sure if he had LOC.  He is not exactly sure how he fell today does not think he hit anything.  He is denying any headache, earache, sore throat, chest pain, cough, shortness of breath, abdominal pain or back pain acute on chronic vomiting or diarrhea or recent injuries or falls.  He does note that the fourth digit in the left hand has been darker than usual over the last 3 weeks after he clipped the nail for back and is concerned about infection.Marland Kitchen  He also notes he is currently on doxycycline and Keflex for cellulitis in his left upper thigh and that the area of redness and discomfort in this area has been improving the last couple days.  No recent injuries or falls.  Denies any illicit drug use.  Per wife at bedside when he got home from dialysis he was confused and found by family.         Past Medical History:  Diagnosis Date   Anxiety    Cellulitis    CHF (congestive heart failure)  (HCC)    Chronic diarrhea    Chronic kidney disease    Chronic pain of both ankles    Diabetes mellitus without complication (HCC)    Diabetes mellitus, type II ([place])    Diabetic neuropathy (HCC)    Frequent falls    Gait instability    GERD (gastroesophageal reflux disease)    Heart palpitations    History of kidney stones    HLD (hyperlipidemia)    Hypertension    Insomnia    Nausea and vomiting in adult    recurrent    Patient Active Problem List   Diagnosis Date Noted   Postural dizziness with presyncope 11/24/2020   Elevated troponin 11/24/2020   Cellulitis of left thigh 11/24/2020   Frequent falls 11/24/2020   Finger infection, fourth left 11/24/2020   PAF (paroxysmal atrial fibrillation) ([place]) 11/24/2020   Hypotension 11/24/2020   Abnormal gait 08/19/2020   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[place]) 08/19/2020   Anxiety state 08/19/2020   Callosity 08/19/2020   ED (erectile dysfunction) of organic origin 08/19/2020   Heart failure ([place]) 08/19/2020   Long term (current) use of insulin ([place]) 08/19/2020   Myalgia 08/19/2020   Obstructive uropathy 08/19/2020   Reactive depression 08/19/2020   History of ST elevation myocardial infarction (STEMI) 02/2020 s/p DES to LAD 08/19/2020   ESRD on dialysis [facility])  08/19/2020   Blind painful left eye 08/15/2020   Combined forms of age-related cataract of right eye 04/17/2020   Supraventricular tachycardia ([place]) 02/26/2020   Acute anterior wall MI ([place])    Benign prostatic hyperplasia with lower urinary tract symptoms 11/07/2019   Fluid overload, unspecified 08/15/2019   Hypertension secondary to other renal disorders 07/20/2019   NVG (neovascular glaucoma), left, 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   Symptomatic anemia 06/05/2019   Aftercare including intermittent dialysis ([place]) 05/15/2019   Moderate protein-calorie malnutrition ([place]) 05/14/2019   Anaphylactic reaction due to adverse effect of correct drug or medicament properly  administered, initial encounter 05/08/2019   Chronic anemia 05/07/2019   Chest pain, unspecified 05/07/2019   Coagulation defect, unspecified ([place]) 05/07/2019   Disorder of phosphorus metabolism, unspecified 05/07/2019   Encounter for fitting and adjustment of extracorporeal dialysis catheter ([place]) 05/07/2019   Gastroparesis 05/07/2019   Iron deficiency anemia, unspecified 05/07/2019   Major depressive disorder, recurrent, unspecified ([place]) 05/07/2019   Pain, unspecified 05/07/2019   Pruritus, unspecified 05/07/2019   Dizziness [id number]   Metabolic acidosis Q000111Q   Recurrent syncope 04/06/2019   Urinary retention 04/06/2019   Hypertensive chronic kidney disease with stage 1 through stage 4 chronic kidney disease, or unspecified chronic kidney disease 11/28/2018   Edema 11/28/2018   Hypokalemia 11/28/2018   Hypo-osmolality and hyponatremia 11/28/2018   Proteinuria 11/28/2018   Secondary hyperparathyroidism of renal origin ([place]) 11/28/2018   Lymphedema 01/09/2018   Chronic diastolic heart failure ([place]) 12/13/2017   Vomiting 11/28/2017   Acute on chronic heart failure ([place]) 11/23/2017   Type II diabetes mellitus ([place]) 11/23/2017   Diabetic retinopathy ([place]) 07/05/2017   Uncontrolled diabetes mellitus 07/08/2016   Uncontrolled type 2 diabetes mellitus with hyperglycemia, with long-term current use of insulin ([place]) 04/20/2016   Osteomyelitis of toe of left foot ([place]) 03/15/2016   Acute renal failure with tubular necrosis ([place]) 06/23/2015   Diarrhea 06/23/2015   Mixed hyperlipidemia    Essential hypertension, benign    Heart palpitations 05/06/2015   Sepsis ([place]) 03/07/2015    Past Surgical History:  Procedure Laterality Date   AMPUTATION Left 03/16/2016   Procedure: AMPUTATION DIGIT LEFT HALLUX;  Surgeon: Trula Slade, DPM;  Location: [place];  Service: Podiatry;  Laterality: Left;  can start around 5    ARTHROSCOPIC REPAIR ACL Left    AV FISTULA PLACEMENT Right  05/31/2019   Procedure: Brachiocephalic AV fistula creation;  Surgeon: Algernon Huxley, MD;  Location: [name] ORS;  Service: Vascular;  Laterality: Right;   COLONOSCOPY WITH PROPOFOL N/A 10/28/2015   Procedure: COLONOSCOPY WITH PROPOFOL;  Surgeon: Lollie Sails, MD;  Location[facility] ENDOSCOPY;  Service: Endoscopy;  Laterality: N/A;   COLONOSCOPY WITH PROPOFOL N/A 10/29/2015   Procedure: COLONOSCOPY WITH PROPOFOL;  Surgeon: Lollie Sails, MD;  Location: [facility] ENDOSCOPY;  Service: Endoscopy;  Laterality: N/A;   CORONARY/GRAFT ACUTE MI REVASCULARIZATION N/A 02/26/2020   Procedure: Coronary/Graft Acute MI Revascularization;  Surgeon: Jettie Booze, MD;  Location: [place] CV LAB;  Service: Cardiovascular;  Laterality: N/A;   DIALYSIS/PERMA CATHETER INSERTION Right 04/26/2019   Perm Cath    DIALYSIS/PERMA CATHETER INSERTION N/A 04/26/2019   Procedure: DIALYSIS/PERMA CATHETER INSERTION;  Surgeon: Algernon Huxley, MD;  Location: [place] CV LAB;  Service: Cardiovascular;  Laterality: N/A;   DIALYSIS/PERMA CATHETER REMOVAL N/A 08/15/2019   Procedure: DIALYSIS/PERMA CATHETER REMOVAL;  Surgeon: Katha Cabal, MD;  Location: [place] CV LAB;  Service: Cardiovascular;  Laterality: N/A;   ESOPHAGOGASTRODUODENOSCOPY (  EGD) WITH PROPOFOL N/A 12/27/2017   Procedure: ESOPHAGOGASTRODUODENOSCOPY (EGD) WITH PROPOFOL;  Surgeon: Tol[place], Benay Pike, MD;  Location: [name] ENDOSCOPY;  Service: Gastroenterology;  Laterality: N/A;   INTRAVASCULAR ULTRASOUND/IVUS N/A 02/26/2020   Procedure: Intravascular Ultrasound/IVUS;  Surgeon: Jettie Booze, MD;  Location: [place] [facility];  Service: Cardiovascular;  Laterality: N/A;   LEFT HEART CATH AND CORONARY ANGIOGRAPHY N/A 02/26/2020   Procedure: LEFT HEART CATH AND CORONARY ANGIOGRAPHY;  Surgeon: Jettie Booze, MD;  Location: [place] CV LAB;  Service: Cardiovascular;  Laterality: N/A;   PROSTATE SURGERY  2016   TONSILECTOMY/ADENOIDECTOMY WITH MYRINGOTOMY      TONSILLECTOMY      Prior to Admission medications   Medication Sig Start Date End Date Taking? Authorizing Provider  amiodarone (PACERONE) 200 MG tablet Take 200 mg twice a day for 1 week and then start taking 200 mg once a day 11/07/20   Milton Ferguson, MD  amoxicillin (AMOXIL) 500 MG capsule Take 500 mg by mouth daily as needed. 11/07/20   [provider]  aspirin 81 MG chewable tablet CHEW 1 TABLET (81 MG TOTAL) BY MOUTH DAILY. Patient taking differently: Chew 81 mg by mouth daily. 02/28/20 02/27/21  Cheryln Manly, NP  atorvastatin (LIPITOR) 80 MG tablet TAKE 1 TABLET (80 MG TOTAL) BY MOUTH AT BEDTIME. Patient taking differently: Take 80 mg by mouth at bedtime. 02/28/20 02/27/21  Cheryln Manly, NP  azelastine (ASTELIN) 0.1 % nasal spray Place 2 sprays into both nostrils 2 (two) times daily. 09/08/20   [provider]  carvedilol (COREG) 25 MG tablet Take 25 mg by mouth every morning. 08/15/19   [provider]  clopidogrel (PLAVIX) 75 MG tablet START PLAVIX AND TAKE 300 MG ON 2/7, THEN ON 2/8 TAKE PLAVIX 75 MG DAILY. Patient taking differently: Take 75 mg by mouth daily. START PLAVIX AND TAKE 300 MG ON 2/7, THEN ON 2/8 TAKE PLAVIX 75 MG DAILY. 03/13/20   Tommie Raymond, NP  dorzolamide-timolol (COSOPT) 22.3-6.8 MG/ML ophthalmic solution Place 1 drop into the right eye 2 (two) times daily. 11/28/19 11/27/20  [provider]  doxycycline (VIBRAMYCIN) 100 MG capsule Take 1 capsule (100 mg total) by mouth 2 (two) times daily. 11/06/20   Triplett, Tammy, PA-C  FEROSUL 325 (65 Fe) MG tablet Take 325 mg by mouth daily. 09/08/20   [provider]  fluticasone (FLONASE) 50 MCG/ACT nasal spray Place 2 sprays into both nostrils daily. 09/08/20   [provider]  lidocaine-prilocaine (EMLA) cream Apply 1 application topically Every Tuesday,Thursday,and Saturday with dialysis.  08/15/19   [provider]  losartan (COZAAR) 25 MG tablet Take 25 mg  by mouth daily. 10/21/20   [provider]  nitroGLYCERIN (NITROSTAT) 0.4 MG SL tablet PLACE 1 TABLET (0.4 MG TOTAL) UNDER THE TONGUE EVERY FIVE MINUTES AS NEEDED. Patient taking differently: Place 0.4 mg under the tongue every 5 (five) minutes as needed for chest pain. 02/28/20 02/27/21  Cheryln Manly, NP  omeprazole (PRILOSEC) 40 MG capsule Take 40 mg by mouth 2 (two) times daily. 09/08/20   [provider]  pantoprazole (PROTONIX) 40 MG tablet Take 1 tablet (40 mg total) by mouth daily. 03/13/20 03/13/21  Kathyrn Drown D, NP  prednisoLONE acetate (PRED FORTE) 1 % ophthalmic suspension Place 1 drop into the left eye every 4 (four) hours.    [provider]  tamsulosin (FLOMAX) 0.4 MG CAPS capsule Take 0.4 mg by mouth daily after breakfast.  [provider]    Allergies Levofloxacin, Promethazine, Other, and Malt  Family History  Problem Relation Age of Onset   CAD Father    Stroke Father    Diabetes Mellitus II Mother    Kidney failure Mother    Schizophrenia Mother     Social History Social History   Tobacco Use   Smoking status: Never   Smokeless tobacco: Never  Vaping Use   Vaping Use: Never used  Substance Use Topics   Alcohol use: No   Drug use: No    Review of Systems  Review of Systems  Constitutional:  Positive for malaise/fatigue. Negative for chills and fever.  HENT:  Negative for sore throat.   Eyes:  Negative for pain.  Respiratory:  Negative for cough and stridor.   Cardiovascular:  Negative for chest pain.  Gastrointestinal:  Negative for vomiting.  Musculoskeletal:  Positive for falls and myalgias (L leg).  Skin:  Negative for rash.  Neurological:  Positive for dizziness and loss of consciousness. Negative for seizures and headaches.  Psychiatric/Behavioral:  Negative for suicidal ideas.   All other systems reviewed and are negative.    ____________________________________________   PHYSICAL EXAM:  VITAL  SIGNS: ED Triage Vitals  Enc Vitals Group     BP 11/24/20 1650 (!) 126/99     Pulse Rate 11/24/20 1650 (!) 129     Resp 11/24/20 1650 18     Temp 11/24/20 1650 98.8 F (37.1 C)     Temp Source 11/24/20 1650 Oral     SpO2 11/24/20 1650 97 %     Weight --      Height --      Head Circumference --      Peak Flow --      Pain Score 11/24/20 1649 6     Pain Loc --      Pain Edu? --      Excl. in [place]? --    Vitals:   11/24/20 2100 11/24/20 2130  BP: 135/69 134/65  Pulse: 80 78  Resp: 15 11  Temp:    SpO2: 98% 98%   Physical Exam Vitals and nursing note reviewed.  Constitutional:      Appearance: He is well-developed.  HENT:     Head: Normocephalic and atraumatic.     Right Ear: External ear normal.     Left Ear: External ear normal.     Nose: Nose normal.  Eyes:     Conjunctiva/sclera: Conjunctivae normal.  Cardiovascular:     Rate and Rhythm: Tachycardia present. Rhythm irregular.     Heart sounds: No murmur heard. Pulmonary:     Effort: Pulmonary effort is normal. No respiratory distress.     Breath sounds: Normal breath sounds.  Abdominal:     Palpations: Abdomen is soft.     Tenderness: There is no abdominal tenderness.  Musculoskeletal:     Cervical back: Neck supple.  Skin:    General: Skin is warm and dry.  Neurological:     Mental Status: He is alert.  Psychiatric:        Mood and Affect: Mood normal.    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  The exception of the right fourth digit patient has symmetric strength in his bilateral upper and lower extremities.  The right fourth digit is black edematous and patient is able to flex or extend it.  It appears necrotic although there  is no active drainage or bleeding.  No other obvious wounds or lesions in the hand.  2+ radial pulse.  No tenderness step-offs or deformities over the C/T/L-spine.  Right extremity axis site is unremarkable.  No obvious trauma to  the abdomen hips or knees.  There is some mild induration erythema on the medial left thigh without streaking, fluctuance, bleeding or drainage or other surrounding skin changes. ____________________________________________   LABS (all labs ordered are listed, but only abnormal results are displayed)  Labs Reviewed  BASIC METABOLIC PANEL - Abnormal; Notable for the following components:      Result Value   Chloride 113 (*)    CO2 18 (*)    Glucose, Bld 145 (*)    BUN 52 (*)    Creatinine, Ser 12.64 (*)    Calcium 7.9 (*)    GFR, Estimated 4 (*)    All other components within normal limits  CBC WITH DIFFERENTIAL/PLATELET - Abnormal; Notable for the following components:   RBC 3.89 (*)    Hemoglobin 9.2 (*)    HCT 29.3 (*)    MCV 75.3 (*)    MCH 23.7 (*)    RDW 19.8 (*)    nRBC 1.9 (*)    Lymphs Abs 0.6 (*)    All other components within normal limits  LACTIC ACID, PLASMA - Abnormal; Notable for the following components:   Lactic Acid, Venous 2.6 (*)    All other components within normal limits  PROTIME-INR - Abnormal; Notable for the following components:   Prothrombin Time 15.6 (*)    All other components within normal limits  TROPONIN I (HIGH SENSITIVITY) - Abnormal; Notable for the following components:   Troponin I (High Sensitivity) 104 (*)    All other components within normal limits  TROPONIN I (HIGH SENSITIVITY) - Abnormal; Notable for the following components:   Troponin I (High Sensitivity) 731 (*)    All other components within normal limits  RESP PANEL BY RT-PCR (FLU A&B, COVID) ARPGX2  CULTURE, BLOOD (SINGLE)  LACTIC ACID, PLASMA  PROCALCITONIN  MAGNESIUM  APTT  CBG MONITORING, ED   ____________________________________________  EKG  A. fib with a ventricular rate of 120, some diffuse nonspecific change in anterior and lateral leads without other evidence of acute ischemia.  QTc interval is prolonged at  531. ____________________________________________  RADIOLOGY  ED MD interpretation: CT head has no evidence of acute intracranial hemorrhage, skull fracture or other acute intracranial process.  CT C-spine has no evidence of acute C-spine injury.  Chest x-ray has no focal consolidation but evidence of cardiac enlargement.  No overt edema, rib fracture, pneumothorax, effusion or other clear acute thoracic process.  Plain film of the right finger shows no acute fracture or clear osseous pathology.  Official radiology report(s): CT HEAD WO CONTRAST (5MM)  Result Date: 11/24/2020 CLINICAL DATA:  Mental status change, unknown cause; Neck trauma, impaired ROM (Age 65-64y) EXAM: CT HEAD WITHOUT CONTRAST CT CERVICAL SPINE WITHOUT CONTRAST TECHNIQUE: Multidetector CT imaging of the head and cervical spine was performed following the standard protocol without intravenous contrast. Multiplanar CT image reconstructions of the cervical spine were also generated. COMPARISON:  07/18/2017 FINDINGS: CT HEAD FINDINGS Brain: No evidence of acute infarction, hemorrhage, hydrocephalus, extra-axial collection or mass lesion/mass effect. Vascular: Atherosclerotic calcifications involving the large vessels of the skull base. No unexpected hyperdense vessel. Skull: Normal. Negative for fracture or focal lesion. Sinuses/Orbits: No acute finding. Other: None. CT CERVICAL SPINE FINDINGS Alignment: Facet joints are aligned  without dislocation or traumatic listhesis. Dens and lateral masses are aligned. Straightening of the cervical lordosis. Skull base and vertebrae: No acute fracture. No primary bone lesion or focal pathologic process. Soft tissues and spinal canal: No prevertebral fluid or swelling. No visible canal hematoma. 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 Minimal facet arthropathy bilaterally. Upper chest: Included lung apices are clear. Other: None. IMPRESSION: 1. No acute intracranial findings. 2. No acute  fracture or traumatic listhesis of the cervical spine. 3. 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 Electronically Signed   By: Davina Poke D.O.   On: 11/24/2020 21:29   CT Cervical Spine Wo Contrast  Result Date: 11/24/2020 CLINICAL DATA:  Mental status change, unknown cause; Neck trauma, impaired ROM (Age 34-64y) EXAM: CT HEAD WITHOUT CONTRAST CT CERVICAL SPINE WITHOUT CONTRAST TECHNIQUE: Multidetector CT imaging of the head and cervical spine was performed following the standard protocol without intravenous contrast. Multiplanar CT image reconstructions of the cervical spine were also generated. COMPARISON:  07/18/2017 FINDINGS: CT HEAD FINDINGS Brain: No evidence of acute infarction, hemorrhage, hydrocephalus, extra-axial collection or mass lesion/mass effect. Vascular: Atherosclerotic calcifications involving the large vessels of the skull base. No unexpected hyperdense vessel. Skull: Normal. Negative for fracture or focal lesion. Sinuses/Orbits: No acute finding. Other: None. CT CERVICAL SPINE FINDINGS Alignment: Facet joints are aligned without dislocation or traumatic listhesis. Dens and lateral masses are aligned. Straightening of the cervical lordosis. Skull base and vertebrae: No acute fracture. No primary bone lesion or focal pathologic process. Soft tissues and spinal canal: No prevertebral fluid or swelling. No visible canal hematoma. 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 Minimal facet arthropathy bilaterally. Upper chest: Included lung apices are clear. Other: None. IMPRESSION: 1. No acute intracranial findings. 2. No acute fracture or traumatic listhesis of the cervical spine. 3. 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 Electronically Signed   By: Davina Poke D.O.   On: 11/24/2020 21:29   DG Chest Port 1 View  Result Date: 11/24/2020 CLINICAL DATA:  Weakness EXAM: PORTABLE CHEST 1 VIEW COMPARISON:  11/07/2020, 06/04/2019, 02/26/2020 FINDINGS: Cardiomegaly without edema, pleural  effusion, focal opacity or pneumothorax. IMPRESSION: No active disease. Similar cardiomegaly since January 2022, increased compared to radiographs prior to this. Findings could be due to multi chamber enlargement or pericardial effusion. Electronically Signed   By: Donavan Foil M.D.   On: 11/24/2020 21:15   DG Finger Ring Right  Result Date: 11/24/2020 CLINICAL DATA:  Discoloration of ring finger EXAM: RIGHT RING FINGER 2+V COMPARISON:  None. FINDINGS: No fracture or malalignment. Vascular calcifications. Possible ulcer at the tip of the fourth digit. Possible small amount of air under the nail bed. IMPRESSION: No acute osseous abnormality. Possible soft tissue ulcer at the tip of the fourth digit Electronically Signed   By: Donavan Foil M.D.   On: 11/24/2020 21:16    ____________________________________________   PROCEDURES  Procedure(s) performed (including Critical Care):  .1-3 Lead EKG Interpretation Performed by: Lucrezia Starch, MD Authorized by: Lucrezia Starch, MD     Interpretation: abnormal     ECG rate assessment: tachycardic     Rhythm: atrial fibrillation     ____________________________________________   INITIAL IMPRESSION / ASSESSMENT AND PLAN / ED COURSE      Patient presents with above-stated history exam for assessment of several concerns primarily related to low blood pressure and feeling weak and dizzy with recurrent falls and syncopal episode today.  Patient reportedly found passed out in his house by family.  He  states he does not exactly member this does not think he hit his head but does think he hit his head when he fell last night.  He also states he has had cellulitis in his left leg but has been improving with antibiotics and notes that he has a black area on his right fourth digit that he think started about 3 weeks ago.  No other clear acute sick symptoms.  States he is compliant with all his medications.  Denies any illicit drug use or EtOH  use.  Differential includes syncope related to excess Removal during dialysis, dehydration from poor p.o. intake, sepsis from chronic infection in the right finger versus cellulitis in the left inner thigh, CVA, ICH she is other intracranial hemorrhage reported recent fall, ACS, arrhythmia, anemia and metabolic derangements.  No other obvious source of infection on exam.  CT head has no evidence of acute intracranial hemorrhage, skull fracture or other acute intracranial process.  CT C-spine has no evidence of acute C-spine injury.  Chest x-ray has no focal consolidation but evidence of cardiac enlargement.  No overt edema, rib fracture, pneumothorax, effusion or other clear acute thoracic process.  Plain film of the right finger shows no acute fracture or clear osseous pathology.  CBC without leukocytosis or evidence of acute anemia.  BMP with mild acidosis with a bicarb of 18 and a BUN of 52 as well as creatinine consistent patient's known history of ESRD.  No other acute electrolyte derangements.  Lactic acid initially elevated 2.6.  On repeat lactic acid is 1.6.  Magnesium WNL.  COVID influenza test is negative.  Initial ECG showing A. fib with RVR.  He was given 10 of diltiazem given appropriate rate control.  However he still was noted to have multiple nonspecific changes.  Initial troponin is elevated at 104 compared to 32 weeks ago.  I am concerned for demand ischemia in the setting of tacky dysrhythmia possibly also contributing to his low blood pressure dizziness and syncope that occurred earlier today.  Patient given ASA.  Heparin deferred pending repeat troponin as patient denying any chest pain at this time.  Overall I do not believe he is septic at this time.  I will plan to admit to medicine service for further evaluation management of high risk syncope and NSTEMI.  In addition I did reach out to orthopedic service Dr. Harlow Mares concern for dry gangrene of the right finger and he noted he  would see the patient tomorrow.  Do not believe he requires amputation of the digit emergently.  In addition the wound in his left inner thigh appears appropriately responding to antibiotics after discussion with patient not appear earlier.  Admit to medicine service for further evaluation and management.      ____________________________________________   FINAL CLINICAL IMPRESSION(S) / ED DIAGNOSES  Final diagnoses:  NSTEMI (non-ST elevated myocardial infarction) ([place])  Atrial fibrillation with RVR ([place])  Syncope, unspecified syncope type  Fall, initial encounter  Left leg cellulitis  Cellulitis of finger of right hand  ESRD (end stage renal disease) ([place])    Medications  diltiazem (CARDIZEM) 1 mg/mL load via infusion 10 mg (10 mg Intravenous Bolus from Bag 11/24/20 2058)    And  diltiazem (CARDIZEM) 125 mg in dextrose 5% 125 mL (1 mg/mL) infusion (5 mg/hr Intravenous New Bag/Given 11/24/20 2059)  aspirin chewable tablet 324 mg (324 mg Oral Given 11/24/20 2200)     ED Discharge Orders     None  Note:  This document was prepared using Dragon voice recognition software and may include unintentional dictation errors.    Lucrezia Starch, MD 11/24/20 2258

## 2020-11-24 NOTE — ED Triage Notes (Signed)
Pt comes with c/o left thigh pain and hypotension. Pt had dialysis today and received all treatment. Pt states he fell at home and then got disoriented per wife.  Pt states he just doesn't feel well.

## 2020-11-24 NOTE — H&P (Signed)
History and Physical    [name]. [id number] DOB: 12-Jul-1970 DOA: 11/24/2020  PCP: [name], MD   Patient coming from: home  I have personally briefly reviewed patient's old medical records in [place]  Chief Complaint: altered mental status  HPI: [name]. is a 50 y.o. male with medical history significant for DM, HTN, ESRD on HD, diastolic CHF, PAF on amiodarone, not on anticoagulation, history of STEMI 02/2020 s/p DES to LAD,  currently on antibiotics for treatment of cellulitis of his left thigh who was brought to the ED after his family found him confused on his return from dialysis.  Patient has been having recurrent presyncopal and syncopal episodes and falls, over the past few weeks and has had hypotensive episodes related to dialysis He has no evidence of external injuries from his falls.  He has not otherwise been ill.  Denies cough or shortness of breath, chest pain or palpitations and denies fever or chills.  Has had no vomiting or diarrhea.  He does report a possible right fourth finger infection where he tried to clip his nail which appears to be getting worse.  BP was apparently low at dialysis earlier in the day.  ED course: On arrival, patient was more awake and alert, BP 126/99.  He was found to be in rapid A. fib with rate of 129.  Vitals were otherwise unremarkable Blood work notable for troponin of 104.  WBC 8.5 with lactic acid 2.6.  Hemoglobin 9.2 with a baseline about 9.2-10.5.  BMP mostly unremarkable.  Procalcitonin 4.17.  EKG, personally viewed and interpreted EKG #1: A. fib with RVR at 120 with lateral ischemia EKG #2:( post diltiazem bolus) sinus rhythm at 79 with nonspecific ST-T wave changes  Imaging: CT head and C-spine with no acute injury X-ray ring finger left hand: No acute osseous abnormality.  Possible soft tissue ulcer at tip of fourth digit Chest x-ray no active disease  Patient rapidly converted to NSR after  diltiazem bolus.  He was given chewable aspirin 324 mg.  Hospitalist consulted for admission  Review of Systems: As per HPI otherwise all other systems on review of systems negative.    Past Medical History:  Diagnosis Date   Anxiety    Cellulitis    CHF (congestive heart failure) (HCC)    Chronic diarrhea    Chronic kidney disease    Chronic pain of both ankles    Diabetes mellitus without complication (HCC)    Diabetes mellitus, type II (HCC)    Diabetic neuropathy (HCC)    Frequent falls    Gait instability    GERD (gastroesophageal reflux disease)    Heart palpitations    History of kidney stones    HLD (hyperlipidemia)    Hypertension    Insomnia    Nausea and vomiting in adult    recurrent    Past Surgical History:  Procedure Laterality Date   AMPUTATION Left 03/16/2016   Procedure: AMPUTATION DIGIT LEFT HALLUX;  Surgeon: Trula Slade, DPM;  Location: [place];  Service: Podiatry;  Laterality: Left;  can start around 3    ARTHROSCOPIC REPAIR ACL Left    AV FISTULA PLACEMENT Right 05/31/2019   Procedure: Brachiocephalic AV fistula creation;  Surgeon: Algernon Huxley, MD;  Location: [name];  Service: Vascular;  Laterality: Right;   COLONOSCOPY WITH PROPOFOL N/A 10/28/2015   Procedure: COLONOSCOPY WITH PROPOFOL;  Surgeon: Lollie Sails, MD;  Location[facility] END[facility]COPY;  Service: Endoscopy;  Laterality: N/A;   COLONOSCOPY WITH PROPOFOL N/A 10/29/2015   Procedure: COLONOSCOPY WITH PROPOFOL;  Surgeon: Lollie Sails, MD;  Location: [facility] ENDOSCOPY;  Service: Endoscopy;  Laterality: N/A;   CORONARY/GRAFT ACUTE MI REVASCULARIZATION N/A 02/26/2020   Procedure: Coronary/Graft Acute MI Revascularization;  Surgeon: Jettie Booze, MD;  Location: [place] [facility];  Service: Cardiovascular;  Laterality: N/A;   DIALYSIS/PERMA CATHETER INSERTION Right 04/26/2019   Perm Cath    DIALYSIS/PERMA CATHETER INSERTION N/A 04/26/2019   Procedure: DIALYSIS/PERMA CATHETER INSERTION;   Surgeon: Algernon Huxley, MD;  Location: [place] [facility];  Service: Cardiovascular;  Laterality: N/A;   DIALYSIS/PERMA CATHETER REMOVAL N/A 08/15/2019   Procedure: DIALYSIS/PERMA CATHETER REMOVAL;  Surgeon: Katha Cabal, MD;  Location: [place] [facility];  Service: Cardiovascular;  Laterality: N/A;   ESOPHAGOGASTRODUODENOSCOPY (EGD) WITH PROPOFOL N/A 12/27/2017   Procedure: ESOPHAGOGASTRODUODENOSCOPY (EGD) WITH PROPOFOL;  Surgeon: Toledo, Benay Pike, MD;  Location: [name] ENDOSCOPY;  Service: Gastroenterology;  Laterality: N/A;   INTRAVASCULAR ULTRASOUND/IVUS N/A 02/26/2020   Procedure: Intravascular Ultrasound/IVUS;  Surgeon: Jettie Booze, MD;  Location: [place] [facility];  Service: Cardiovascular;  Laterality: N/A;   LEFT HEART CATH AND CORONARY ANGIOGRAPHY N/A 02/26/2020   Procedure: LEFT HEART CATH AND CORONARY ANGIOGRAPHY;  Surgeon: Jettie Booze, MD;  Location: [place] [facility];  Service: Cardiovascular;  Laterality: N/A;   PROSTATE SURGERY  2016   TONSILECTOMY/ADENOIDECTOMY WITH MYRINGOTOMY     TONSILLECTOMY       reports that he has never smoked. He has never used smokeless tobacco. He reports that he does not drink alcohol and does not use drugs.  Allergies  Allergen Reactions   Levofloxacin Swelling    Of face Other reaction(s): swelling   Promethazine Diarrhea and Other (See Comments)    Other reaction(s): Other (See Comments), Other (See Comments) Other reaction(s): Muscle cramps Muscle cramps Cramping Other reaction(s): Muscle cramps Muscle cramps   Other     Other reaction(s): Unknown   Malt Other (See Comments)    Other reaction(s): cramping    Family History  Problem Relation Age of Onset   CAD Father    Stroke Father    Diabetes Mellitus II Mother    Kidney failure Mother    Schizophrenia Mother       Prior to Admission medications   Medication Sig Start Date End Date Taking? Authorizing Provider  amiodarone (PACERONE) 200 MG tablet  Take 200 mg twice a day for 1 week and then start taking 200 mg once a day 11/07/20   Milton Ferguson, MD  amoxicillin (AMOXIL) 500 MG capsule Take 500 mg by mouth daily as needed. 11/07/20   [provider]  aspirin 81 MG chewable tablet CHEW 1 TABLET (81 MG TOTAL) BY MOUTH DAILY. Patient taking differently: Chew 81 mg by mouth daily. 02/28/20 02/27/21  Cheryln Manly, NP  atorvastatin (LIPITOR) 80 MG tablet TAKE 1 TABLET (80 MG TOTAL) BY MOUTH AT BEDTIME. Patient taking differently: Take 80 mg by mouth at bedtime. 02/28/20 02/27/21  Cheryln Manly, NP  azelastine (ASTELIN) 0.1 % nasal spray Place 2 sprays into both nostrils 2 (two) times daily. 09/08/20   [provider]  carvedilol (COREG) 25 MG tablet Take 25 mg by mouth every morning. 08/15/19   [provider]  clopidogrel (PLAVIX) 75 MG tablet START PLAVIX AND TAKE 300 MG ON 2/7, THEN ON 2/8 TAKE PLAVIX 75 MG DAILY. Patient taking differently: Take 75 mg by mouth  daily. START PLAVIX AND TAKE 300 MG ON 2/7, THEN ON 2/8 TAKE PLAVIX 75 MG DAILY. 03/13/20   Tommie Raymond, NP  dorzolamide-timolol (COSOPT) 22.3-6.8 MG/ML ophthalmic solution Place 1 drop into the right eye 2 (two) times daily. 11/28/19 11/27/20  [provider]  doxycycline (VIBRAMYCIN) 100 MG capsule Take 1 capsule (100 mg total) by mouth 2 (two) times daily. 11/06/20   Triplett, Tammy, PA-C  FEROSUL 325 (65 Fe) MG tablet Take 325 mg by mouth daily. 09/08/20   [provider]  fluticasone (FLONASE) 50 MCG/ACT nasal spray Place 2 sprays into both nostrils daily. 09/08/20   [provider]  lidocaine-prilocaine (EMLA) cream Apply 1 application topically Every Tuesday,Thursday,and Saturday with dialysis.  08/15/19   [provider]  losartan (COZAAR) 25 MG tablet Take 25 mg by mouth daily. 10/21/20   [provider]  nitroGLYCERIN (NITROSTAT) 0.4 MG SL tablet PLACE 1 TABLET (0.4 MG TOTAL) UNDER THE TONGUE EVERY FIVE MINUTES  AS NEEDED. Patient taking differently: Place 0.4 mg under the tongue every 5 (five) minutes as needed for chest pain. 02/28/20 02/27/21  Cheryln Manly, NP  omeprazole (PRILOSEC) 40 MG capsule Take 40 mg by mouth 2 (two) times daily. 09/08/20   [provider]  pantoprazole (PROTONIX) 40 MG tablet Take 1 tablet (40 mg total) by mouth daily. 03/13/20 03/13/21  Kathyrn Drown D, NP  prednisoLONE acetate (PRED FORTE) 1 % ophthalmic suspension Place 1 drop into the left eye every 4 (four) hours.    [provider]  tamsulosin (FLOMAX) 0.4 MG CAPS capsule Take 0.4 mg by mouth daily after breakfast.     [provider]    Physical Exam: Vitals:   11/24/20 1650 11/24/20 2036 11/24/20 2100 11/24/20 2130  BP: (!) 126/99 138/65 135/69 134/65  Pulse: (!) 129 80 80 78  Resp: '18 16 15 11  '$ Temp: 98.8 F (37.1 C)     TempSrc: Oral     SpO2: 97% 100% 98% 98%     Vitals:   11/24/20 1650 11/24/20 2036 11/24/20 2100 11/24/20 2130  BP: (!) 126/99 138/65 135/69 134/65  Pulse: (!) 129 80 80 78  Resp: '18 16 15 11  '$ Temp: 98.8 F (37.1 C)     TempSrc: Oral     SpO2: 97% 100% 98% 98%      Constitutional: Alert and oriented x 3 . Not in any apparent distress HEENT:      Head: Normocephalic and atraumatic.         Eyes: PERLA, EOMI, Conjunctivae are normal. Sclera is non-icteric.       Mouth/Throat: Mucous membranes are moist.       Neck: Supple with no signs of meningismus. Cardiovascular: Regular rate and rhythm. No murmurs, gallops, or rubs. 2+ symmetrical distal pulses are present . No JVD. No LE edema Respiratory: Respiratory effort normal .Lungs sounds clear bilaterally. No wheezes, crackles, or rhonchi.  Gastrointestinal: Soft, non tender, and non distended with positive bowel sounds.  Genitourinary: No CVA tenderness. Musculoskeletal: Dry gangrene right fourth finger, nontender.  Left upper posteromedial thigh with residual minimal tenderness Neurologic:  Face is  symmetric. Moving all extremities. No gross focal neurologic deficits . Skin: Skin is warm, dry.  No rash or ulcers Psychiatric: Mood and affect are normal    Labs on Admission: I have personally reviewed following labs and imaging studies  CBC: Recent Labs  Lab 11/24/20 1652  WBC 8.5  NEUTROABS 7.2  HGB 9.2*  HCT 29.3*  MCV 75.3*  PLT 0000000   Basic Metabolic Panel: Recent Labs  Lab 11/24/20 1652  NA 142  K 3.7  CL 113*  CO2 18*  GLUCOSE 145*  BUN 52*  CREATININE 12.64*  CALCIUM 7.9*  MG 2.0   GFR: CrCl cannot be calculated (Unknown ideal weight.). Liver Function Tests: No results for input(s): AST, ALT, ALKPHOS, BILITOT, PROT, ALBUMIN in the last 168 hours. No results for input(s): LIPASE, AMYLASE in the last 168 hours. No results for input(s): AMMONIA in the last 168 hours. Coagulation Profile: Recent Labs  Lab 11/24/20 2036  INR 1.2   Cardiac Enzymes: No results for input(s): CKTOTAL, CKMB, CKMBINDEX, TROPONINI in the last 168 hours. BNP (last 3 results) No results for input(s): PROBNP in the last 8760 hours. HbA1C: No results for input(s): HGBA1C in the last 72 hours. CBG: No results for input(s): GLUCAP in the last 168 hours. Lipid Profile: No results for input(s): CHOL, HDL, LDLCALC, TRIG, CHOLHDL, LDLDIRECT in the last 72 hours. Thyroid Function Tests: No results for input(s): TSH, T4TOTAL, FREET4, T3FREE, THYROIDAB in the last 72 hours. Anemia Panel: No results for input(s): VITAMINB12, FOLATE, FERRITIN, TIBC, IRON, RETICCTPCT in the last 72 hours. Urine analysis:    Component Value Date/Time   COLORURINE YELLOW (A) 11/07/2017 1855   APPEARANCEUR CLOUDY (A) 11/07/2017 1855   LABSPEC 1.016 11/07/2017 1855   PHURINE 5.0 11/07/2017 1855   GLUCOSEU >=500 (A) 11/07/2017 1855   HGBUR MODERATE (A) 11/07/2017 1855   BILIRUBINUR NEGATIVE 11/07/2017 1855   KETONESUR NEGATIVE 11/07/2017 1855   PROTEINUR >=300 (A) 11/07/2017 1855   NITRITE NEGATIVE  11/07/2017 1855   LEUKOCYTESUR NEGATIVE 11/07/2017 1855    Radiological Exams on Admission: CT HEAD WO CONTRAST (5MM)  Result Date: 11/24/2020 CLINICAL DATA:  Mental status change, unknown cause; Neck trauma, impaired ROM (Age 9-64y) EXAM: CT HEAD WITHOUT CONTRAST CT CERVICAL SPINE WITHOUT CONTRAST TECHNIQUE: Multidetector CT imaging of the head and cervical spine was performed following the standard protocol without intravenous contrast. Multiplanar CT image reconstructions of the cervical spine were also generated. COMPARISON:  07/18/2017 FINDINGS: CT HEAD FINDINGS Brain: No evidence of acute infarction, hemorrhage, hydrocephalus, extra-axial collection or mass lesion/mass effect. Vascular: Atherosclerotic calcifications involving the large vessels of the skull base. No unexpected hyperdense vessel. Skull: Normal. Negative for fracture or focal lesion. Sinuses/Orbits: No acute finding. Other: None. CT CERVICAL SPINE FINDINGS Alignment: Facet joints are aligned without dislocation or traumatic listhesis. Dens and lateral masses are aligned. Straightening of the cervical lordosis. Skull base and vertebrae: No acute fracture. No primary bone lesion or focal pathologic process. Soft tissues and spinal canal: No prevertebral fluid or swelling. No visible canal hematoma. 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 Minimal facet arthropathy bilaterally. Upper chest: Included lung apices are clear. Other: None. IMPRESSION: 1. No acute intracranial findings. 2. No acute fracture or traumatic listhesis of the cervical spine. 3. 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 Electronically Signed   By: Davina Poke D.O.   On: 11/24/2020 21:29   CT Cervical Spine Wo Contrast  Result Date: 11/24/2020 CLINICAL DATA:  Mental status change, unknown cause; Neck trauma, impaired ROM (Age 73-64y) EXAM: CT HEAD WITHOUT CONTRAST CT CERVICAL SPINE WITHOUT CONTRAST TECHNIQUE: Multidetector CT imaging of the head and cervical  spine was performed following the standard protocol without intravenous contrast. Multiplanar CT image reconstructions of the cervical spine were also generated. COMPARISON:  07/18/2017 FINDINGS: CT HEAD FINDINGS Brain: No evidence of acute infarction,  hemorrhage, hydrocephalus, extra-axial collection or mass lesion/mass effect. Vascular: Atherosclerotic calcifications involving the large vessels of the skull base. No unexpected hyperdense vessel. Skull: Normal. Negative for fracture or focal lesion. Sinuses/Orbits: No acute finding. Other: None. CT CERVICAL SPINE FINDINGS Alignment: Facet joints are aligned without dislocation or traumatic listhesis. Dens and lateral masses are aligned. Straightening of the cervical lordosis. Skull base and vertebrae: No acute fracture. No primary bone lesion or focal pathologic process. Soft tissues and spinal canal: No prevertebral fluid or swelling. No visible canal hematoma. 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 Minimal facet arthropathy bilaterally. Upper chest: Included lung apices are clear. Other: None. IMPRESSION: 1. No acute intracranial findings. 2. No acute fracture or traumatic listhesis of the cervical spine. 3. 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 Electronically Signed   By: Davina Poke D.O.   On: 11/24/2020 21:29   DG Chest Port 1 View  Result Date: 11/24/2020 CLINICAL DATA:  Weakness EXAM: PORTABLE CHEST 1 VIEW COMPARISON:  11/07/2020, 06/04/2019, 02/26/2020 FINDINGS: Cardiomegaly without edema, pleural effusion, focal opacity or pneumothorax. IMPRESSION: No active disease. Similar cardiomegaly since January 2022, increased compared to radiographs prior to this. Findings could be due to multi chamber enlargement or pericardial effusion. Electronically Signed   By: Donavan Foil M.D.   On: 11/24/2020 21:15   DG Finger Ring Right  Result Date: 11/24/2020 CLINICAL DATA:  Discoloration of ring finger EXAM: RIGHT RING FINGER 2+V COMPARISON:  None.  FINDINGS: No fracture or malalignment. Vascular calcifications. Possible ulcer at the tip of the fourth digit. Possible small amount of air under the nail bed. IMPRESSION: No acute osseous abnormality. Possible soft tissue ulcer at the tip of the fourth digit Electronically Signed   By: Donavan Foil M.D.   On: 11/24/2020 21:16     Assessment/Plan 50 year old male with history of DM, HTN, ESRD on HD, diastolic CHF, PAF on amiodarone, not on anticoagulation, history of STEMI 02/2020 s/p DES to LAD, history of recurrent syncope/presyncope/falls/hypotension, currently on antibiotics for treatment of cellulitis of his left thigh, who was brought into the emergency room, after his family found him confused on his return from dialysis.    Acute metabolic encephalopathy Recurrent syncope/recurring hypotensive episodes Frequent falls - Etiology suspect related to hypotension related episode of rapid A. fib with a low flow state, possible ACS, sepsis, dialysis - Patient back to baseline mentation, and BP improved - Neurologic checks and fall precautions - Treat potentially etiologies as outlined below  NSTEMI  History of STEMI 02/2020 s/p DES to LAD - Troponin 104-731, could be related to demand from rapid A. fib, hypotension as patient denies chest pain -Start heparin infusion - Chewable aspirin - Continue home aspirin and avoid BP lowering agents for now  Lactic acidosis/hypotension ?possible sepsis (HCC) Recurring hypotensive episodes -Low suspicion for sepsis but patient did have tachyarrhythmia on arrival, hypotension and lactic acid 2.6 and procalcitonin of 4.17, - Elevated lactic acid believe related to low flow state/tissue hypoxia related to hypotensive episode which has been recurrent - We will order IV hydration - Continue to trend lactic acid and monitor to see if more convincing for sepsis physiology  Paroxysmal A. fib with RVR, not on anticoagulation -On amiodarone - Not on  anticoagulation as episode was brief, in the setting of demand ischemia in the setting of hypotension, as well as being on DAPT - Patient seen to be in RVR in the ED which rapidly converted to sinus - Will defer to cardiology for decision on  initiation of anticoagulation, given recurrent episode of PAF   Cellulitis of left thigh Dry gangrene/cyanosis right fourth finger - We will switch to IV antibiotics for ongoing treatment of cellulitis left thigh, for the most part appears resolved - Consider vascular consult for evaluation of fourth right finger  - Consider Ortho consult   ESRD on dialysis [facility]) - Nephrology consult for continuation of dialysis    Essential hypertension, benign - Hold antihypertensives due to hypotension on arrival    Type II diabetes mellitus ([place]) - Sliding scale insulin coverage    Chronic diastolic heart failure ([place]) - Not acutely exacerbated - Holding blood pressure lowering meds at this time    Chronic anemia - Hemoglobin at baseline at 9.2  DVT prophylaxis: Heparin Code Status: full code  Family Communication:  none  Disposition Plan: Back to previous home environment Consults called: Nephrology  status:At the time of admission, it appears that the appropriate admission status for this patient is INPATIENT. This is judged to be reasonable and necessary in order to provide the required intensity of service to ensure the patient's safety given the presenting symptoms, physical exam findings, and initial radiographic and laboratory data in the context of their  Comorbid conditions.   Patient requires inpatient status due to high intensity of service, high risk for further deterioration and high frequency of surveillance required.   I certify that at the point of admission it is my clinical judgment that the patient will require inpatient hospital care spanning beyond [place] MD [facility]     11/24/2020, 10:39 PM

## 2020-11-24 NOTE — ED Triage Notes (Signed)
Pt comes into the ED via EMS from home after receiving dialysis, cellulitis on left thigh, PA received full report

## 2020-11-24 NOTE — Consult Note (Signed)
ANTICOAGULATION CONSULT NOTE - Initial Consult  Pharmacy Consult for heparin Indication: chest pain/ACS  Allergies  Allergen Reactions   Levofloxacin Swelling    Of face Other reaction(s): swelling   Promethazine Diarrhea and Other (See Comments)    Other reaction(s): Other (See Comments), Other (See Comments) Other reaction(s): Muscle cramps Muscle cramps Cramping Other reaction(s): Muscle cramps Muscle cramps   Other     Other reaction(s): Unknown   Malt Other (See Comments)    Other reaction(s): cramping    Patient Measurements:   Heparin Dosing Weight: 93 kg  Vital Signs: Temp: 98.8 F (37.1 C) (10/03 1650) Temp Source: Oral (10/03 1650) BP: 152/72 (10/03 2300) Pulse Rate: 79 (10/03 2300)  Labs: Recent Labs    11/24/20 1652 11/24/20 2036 11/24/20 2210  HGB 9.2*  --   --   HCT 29.3*  --   --   PLT 243  --   --   APTT  --  35  --   LABPROT  --  15.6*  --   INR  --  1.2  --   CREATININE 12.64*  --   --   TROPONINIHS 104*  --  731*    CrCl cannot be calculated (Unknown ideal weight.).   Medical History: Past Medical History:  Diagnosis Date   Anxiety    Cellulitis    CHF (congestive heart failure) (HCC)    Chronic diarrhea    Chronic kidney disease    Chronic pain of both ankles    Diabetes mellitus without complication (HCC)    Diabetes mellitus, type II (HCC)    Diabetic neuropathy (HCC)    Frequent falls    Gait instability    GERD (gastroesophageal reflux disease)    Heart palpitations    History of kidney stones    HLD (hyperlipidemia)    Hypertension    Insomnia    Nausea and vomiting in adult    recurrent    Medications:  No pertinent allergies No PTA anticoagulation  PTA: ASA 81 mg and plavix 75 mg daily   Assessment: 50 y.o. male with a past medical history of anxiety, CHF, DM, GERD, HDL, HTN, ESRD on HD MWF, A. fib not on anticoagulation, CAD, and PVD who presents with complaints of dizziness and syncope. Pt was found to  have a troponin elevated to 731 as well as in A. fib with RVR. Pharmacy has been consulted for heparin dosing for ACS.   Baseline labs:  aPTT 35 INR 1.2 Hgb 6 Plts 243  Goal of Therapy:  Heparin level 0.3-0.7 units/ml Monitor platelets by anticoagulation protocol: Yes   Plan:  Give 4000 units bolus x 1 Start heparin infusion at 1200 units/hr Check anti-Xa level in 8 hours and daily while on heparin Continue to monitor H&H and platelets  Darnelle Bos, PharmD 11/24/2020,11:25 PM

## 2020-11-25 ENCOUNTER — Observation Stay (HOSPITAL_COMMUNITY)
Admit: 2020-11-25 | Discharge: 2020-11-25 | Disposition: A | Discharge status: Home / Self Care | Payer: Medicare Other | Attending: Internal Medicine | Admitting: Internal Medicine

## 2020-11-25 ENCOUNTER — Other Ambulatory Visit (INDEPENDENT_AMBULATORY_CARE_PROVIDER_SITE_OTHER): Payer: Self-pay | Admitting: Vascular Surgery

## 2020-11-25 ENCOUNTER — Encounter: Payer: Self-pay | Admitting: Internal Medicine

## 2020-11-25 DIAGNOSIS — I132 Hypertensive heart and chronic kidney disease with heart failure and with stage 5 chronic kidney disease, or end stage renal disease: Secondary | ICD-10-CM | POA: Diagnosis present

## 2020-11-25 DIAGNOSIS — R55 Syncope and collapse: Secondary | ICD-10-CM

## 2020-11-25 DIAGNOSIS — I4891 Unspecified atrial fibrillation: Secondary | ICD-10-CM | POA: Diagnosis not present

## 2020-11-25 DIAGNOSIS — I480 Paroxysmal atrial fibrillation: Secondary | ICD-10-CM | POA: Diagnosis not present

## 2020-11-25 DIAGNOSIS — T827XXA Infection and inflammatory reaction due to other cardiac and vascular devices, implants and grafts, initial encounter: Secondary | ICD-10-CM | POA: Diagnosis present

## 2020-11-25 DIAGNOSIS — I96 Gangrene, not elsewhere classified: Secondary | ICD-10-CM | POA: Diagnosis present

## 2020-11-25 DIAGNOSIS — N186 End stage renal disease: Secondary | ICD-10-CM | POA: Diagnosis present

## 2020-11-25 DIAGNOSIS — I77819 Aortic ectasia, unspecified site: Secondary | ICD-10-CM

## 2020-11-25 DIAGNOSIS — Y832 Surgical operation with anastomosis, bypass or graft as the cause of abnormal reaction of the patient, or of later complication, without mention of misadventure at the time of the procedure: Secondary | ICD-10-CM | POA: Diagnosis present

## 2020-11-25 DIAGNOSIS — R778 Other specified abnormalities of plasma proteins: Secondary | ICD-10-CM

## 2020-11-25 DIAGNOSIS — L03116 Cellulitis of left lower limb: Secondary | ICD-10-CM

## 2020-11-25 DIAGNOSIS — N2581 Secondary hyperparathyroidism of renal origin: Secondary | ICD-10-CM | POA: Diagnosis present

## 2020-11-25 DIAGNOSIS — G9341 Metabolic encephalopathy: Secondary | ICD-10-CM | POA: Diagnosis present

## 2020-11-25 DIAGNOSIS — Z20822 Contact with and (suspected) exposure to covid-19: Secondary | ICD-10-CM | POA: Diagnosis present

## 2020-11-25 DIAGNOSIS — D631 Anemia in chronic kidney disease: Secondary | ICD-10-CM | POA: Diagnosis present

## 2020-11-25 DIAGNOSIS — Z955 Presence of coronary angioplasty implant and graft: Secondary | ICD-10-CM | POA: Diagnosis not present

## 2020-11-25 DIAGNOSIS — I472 Ventricular tachycardia, unspecified: Secondary | ICD-10-CM | POA: Diagnosis not present

## 2020-11-25 DIAGNOSIS — I5032 Chronic diastolic (congestive) heart failure: Secondary | ICD-10-CM | POA: Diagnosis present

## 2020-11-25 DIAGNOSIS — E872 Acidosis, unspecified: Secondary | ICD-10-CM | POA: Diagnosis present

## 2020-11-25 DIAGNOSIS — E1152 Type 2 diabetes mellitus with diabetic peripheral angiopathy with gangrene: Secondary | ICD-10-CM | POA: Diagnosis present

## 2020-11-25 DIAGNOSIS — Z8249 Family history of ischemic heart disease and other diseases of the circulatory system: Secondary | ICD-10-CM | POA: Diagnosis not present

## 2020-11-25 DIAGNOSIS — D649 Anemia, unspecified: Secondary | ICD-10-CM | POA: Diagnosis not present

## 2020-11-25 DIAGNOSIS — Z833 Family history of diabetes mellitus: Secondary | ICD-10-CM | POA: Diagnosis not present

## 2020-11-25 DIAGNOSIS — E1143 Type 2 diabetes mellitus with diabetic autonomic (poly)neuropathy: Secondary | ICD-10-CM | POA: Diagnosis present

## 2020-11-25 DIAGNOSIS — L03011 Cellulitis of right finger: Secondary | ICD-10-CM | POA: Diagnosis not present

## 2020-11-25 DIAGNOSIS — Z823 Family history of stroke: Secondary | ICD-10-CM | POA: Diagnosis not present

## 2020-11-25 DIAGNOSIS — E1122 Type 2 diabetes mellitus with diabetic chronic kidney disease: Secondary | ICD-10-CM | POA: Diagnosis present

## 2020-11-25 DIAGNOSIS — T82898A Other specified complication of vascular prosthetic devices, implants and grafts, initial encounter: Secondary | ICD-10-CM | POA: Diagnosis not present

## 2020-11-25 DIAGNOSIS — Z992 Dependence on renal dialysis: Secondary | ICD-10-CM | POA: Diagnosis not present

## 2020-11-25 DIAGNOSIS — E1165 Type 2 diabetes mellitus with hyperglycemia: Secondary | ICD-10-CM | POA: Diagnosis present

## 2020-11-25 DIAGNOSIS — I214 Non-ST elevation (NSTEMI) myocardial infarction: Secondary | ICD-10-CM | POA: Diagnosis present

## 2020-11-25 DIAGNOSIS — I3139 Other pericardial effusion (noninflammatory): Secondary | ICD-10-CM | POA: Diagnosis present

## 2020-11-25 DIAGNOSIS — E11319 Type 2 diabetes mellitus with unspecified diabetic retinopathy without macular edema: Secondary | ICD-10-CM | POA: Diagnosis present

## 2020-11-25 HISTORY — DX: Aortic ectasia, unspecified site: I77.819

## 2020-11-25 LAB — ECHOCARDIOGRAM COMPLETE
AR max vel: 3.97 cm2
AV Area VTI: 3.71 cm2
AV Area mean vel: 3.76 cm2
AV Mean grad: 5 mmHg
AV Peak grad: 7.7 mmHg
Ao pk vel: 1.39 m/s
Area-P 1/2: 4.26 cm2
Height: 75 in
MV VTI: 3.98 cm2
S' Lateral: 3.56 cm
Weight: 3280 oz

## 2020-11-25 LAB — LIPID PANEL
Cholesterol: 140 mg/dL (ref 0–200)
HDL: 46 mg/dL (ref >40)
LDL Cholesterol: 82 mg/dL (ref 0–99)
Total CHOL/HDL Ratio: 3 RATIO
Triglycerides: 62 mg/dL (ref <150)
VLDL: 12 mg/dL (ref 0–40)

## 2020-11-25 LAB — HEMOGLOBIN A1C
Hgb A1c MFr Bld: 5.2 % (ref 4.8–5.6)
Mean Plasma Glucose: 103 mg/dL

## 2020-11-25 LAB — BASIC METABOLIC PANEL
Anion gap: 12 (ref 5–15)
BUN: 58 mg/dL — ABNORMAL HIGH (ref 6–20)
CO2: 17 mmol/L — ABNORMAL LOW (ref 22–32)
Calcium: 7.7 mg/dL — ABNORMAL LOW (ref 8.9–10.3)
Chloride: 112 mmol/L — ABNORMAL HIGH (ref 98–111)
Creatinine, Ser: 13.89 mg/dL — ABNORMAL HIGH (ref 0.61–1.24)
GFR, Estimated: 4 mL/min — ABNORMAL LOW (ref >60)
Glucose, Bld: 88 mg/dL (ref 70–99)
Potassium: 4 mmol/L (ref 3.5–5.1)
Sodium: 141 mmol/L (ref 135–145)

## 2020-11-25 LAB — CBG MONITORING, ED
Glucose-Capillary: 72 mg/dL (ref 70–99)
Glucose-Capillary: 78 mg/dL (ref 70–99)
Glucose-Capillary: 96 mg/dL (ref 70–99)

## 2020-11-25 LAB — CBC
HCT: 26.3 % — ABNORMAL LOW (ref 39.0–52.0)
Hemoglobin: 8.4 g/dL — ABNORMAL LOW (ref 13.0–17.0)
MCH: 23.6 pg — ABNORMAL LOW (ref 26.0–34.0)
MCHC: 31.9 g/dL (ref 30.0–36.0)
MCV: 73.9 fL — ABNORMAL LOW (ref 80.0–100.0)
Platelets: 222 10*3/uL (ref 150–400)
RBC: 3.56 MIL/uL — ABNORMAL LOW (ref 4.22–5.81)
RDW: 19.7 % — ABNORMAL HIGH (ref 11.5–15.5)
WBC: 9.1 10*3/uL (ref 4.0–10.5)
nRBC: 0.9 % — ABNORMAL HIGH (ref 0.0–0.2)

## 2020-11-25 LAB — GLUCOSE, CAPILLARY
Glucose-Capillary: 115 mg/dL — ABNORMAL HIGH (ref 70–99)
Glucose-Capillary: 97 mg/dL (ref 70–99)

## 2020-11-25 LAB — TROPONIN I (HIGH SENSITIVITY)
Troponin I (High Sensitivity): 1556 ng/L (ref <18)
Troponin I (High Sensitivity): 1422 ng/L (ref <18)

## 2020-11-25 LAB — HEPATITIS B SURFACE ANTIGEN: Hepatitis B Surface Ag: NONREACTIVE

## 2020-11-25 LAB — TSH: TSH: 3.908 u[IU]/mL (ref 0.350–4.500)

## 2020-11-25 LAB — HEPARIN LEVEL (UNFRACTIONATED)
Heparin Unfractionated: 0.17 IU/mL — ABNORMAL LOW (ref 0.30–0.70)
Heparin Unfractionated: 0.27 IU/mL — ABNORMAL LOW (ref 0.30–0.70)

## 2020-11-25 LAB — HIV ANTIBODY (ROUTINE TESTING W REFLEX): HIV Screen 4th Generation wRfx: NONREACTIVE

## 2020-11-25 MED ORDER — AMIODARONE HCL 200 MG PO TABS
200.0000 mg | ORAL_TABLET | Freq: Every day | ORAL | Status: DC
  Administered 2020-11-25 – 2020-11-27 (×3): 200 mg via ORAL
  Filled 2020-11-25 (×3): qty 1

## 2020-11-25 MED ORDER — HEPARIN BOLUS VIA INFUSION
2000.0000 [IU] | Freq: Once | INTRAVENOUS | Status: AC
  Administered 2020-11-25: 2000 [IU] via INTRAVENOUS
  Filled 2020-11-25: qty 2000

## 2020-11-25 MED ORDER — CALCIUM ACETATE (PHOS BINDER) 667 MG PO CAPS
2001.0000 mg | ORAL_CAPSULE | Freq: Three times a day (TID) | ORAL | Status: DC
  Administered 2020-11-25 – 2020-11-26 (×4): 2001 mg via ORAL
  Filled 2020-11-25 (×9): qty 3

## 2020-11-25 MED ORDER — CLOPIDOGREL BISULFATE 75 MG PO TABS
75.0000 mg | ORAL_TABLET | Freq: Every day | ORAL | Status: DC
  Administered 2020-11-25 – 2020-11-26 (×2): 75 mg via ORAL
  Filled 2020-11-25 (×2): qty 1

## 2020-11-25 MED ORDER — CARVEDILOL 12.5 MG PO TABS
12.5000 mg | ORAL_TABLET | Freq: Two times a day (BID) | ORAL | Status: DC
  Administered 2020-11-25 – 2020-11-27 (×5): 12.5 mg via ORAL
  Filled 2020-11-25: qty 2
  Filled 2020-11-25 (×4): qty 1

## 2020-11-25 MED ORDER — HEPARIN BOLUS VIA INFUSION
1300.0000 [IU] | Freq: Once | INTRAVENOUS | Status: AC
  Administered 2020-11-25: 1300 [IU] via INTRAVENOUS
  Filled 2020-11-25: qty 1300

## 2020-11-25 MED ORDER — CHLORHEXIDINE GLUCONATE CLOTH 2 % EX PADS
6.0000 | MEDICATED_PAD | Freq: Every day | CUTANEOUS | Status: DC
  Administered 2020-11-27: 6 via TOPICAL

## 2020-11-25 NOTE — Progress Notes (Deleted)
Cardiology Office Note   Date:  11/25/2020   ID:  [name] Cooper., DOB 1970-06-21, MRN [id number]  PCP:  [name], MD    No chief complaint on file.  CAD  Wt Readings from Last 3 Encounters:  11/24/20 205 lb (93 kg)  11/07/20 200 lb (90.7 kg)  09/15/20 202 lb 13.2 oz (92 kg)       History of Present Illness: [name] Jami Seaborn. is a 50 y.o. male    has a hx of uncontrolled DM, ESRD on HD (waiting organ transplant), HTN, HLD, gastroparesis, and diastolic dysfunction who was seen by our team after transfer from [facility] with code STEMI.    He underwent an echo 06/2017 that showed an LVEF of 75%, grade 1 DD and mild MR. He has been recently followed at [facility] for organ transplant (nephrology). Prior to his admission, he was at HD when he developed a sudden episode of hypotension with SOB in 60s and tachycardia at 120s. Per reports, he had completed > 3 hours of dialysis.There was no improvement in BP despite 900cc of fluids at dialysis center. On EMS arrival, EKG showed STE in anterior leads with reciprocal change in inferior lateral leads therefore a code STEMI activated. He was given ASA and heparin bolus and was transfered to [facility] for further evaluation.    Left heart cath in Jan 2022 showed: "The left ventricular systolic function is normal. LV end diastolic pressure is normal. The left ventricular ejection fraction is greater than 65% by visual estimate. There is no aortic valve stenosis. Mid LAD lesion is 90% stenosed. A drug-eluting stent was successfully placed using a STENT RESOLUTE ONYX 3.5X30, postdilated to > 4 mm. Stent optimized with IVUS. Post intervention, there is a 0% residual stenosis.   I suspect his abnormal ECG and anterior injury pattern was more from demand ischemia in the setting of hypotension and AFib with RVR, with severe mid LAD lesion.     During the cath, he converted to NSR briefly and his pulse pressure and BP returned to normal without any  pressors. In addition, LVEDP only 11 mm Hg despite having 2.4 L NS administered on transport.  He may have been a little dehydrated after 3 L fluid removal after dialysis.    In regards to AFib.  Continue IV Amiodarone.  Will have to decide if he needs longterm anticoagulation along with antiplatelet therapy.  If so, would change Brilinta to clopidogrel.  Would have to check what options work in the setting of ESRD."   Changed to Plavix in 2/22 due to cost.    He had eye surgery in 3/22.    He owns [place] acres and walks his property.   Wife is donating a kidney to him.      Past Medical History:  Diagnosis Date   Anxiety    Cellulitis    CHF (congestive heart failure) (HCC)    Chronic diarrhea    Chronic kidney disease    Chronic pain of both ankles    Diabetes mellitus without complication (HCC)    Diabetes mellitus, type II (HCC)    Diabetic neuropathy (HCC)    Frequent falls    Gait instability    GERD (gastroesophageal reflux disease)    Heart palpitations    History of kidney stones    HLD (hyperlipidemia)    Hypertension    Insomnia    Nausea and vomiting in adult    recurrent    Past  Surgical History:  Procedure Laterality Date   AMPUTATION Left 03/16/2016   Procedure: AMPUTATION DIGIT LEFT HALLUX;  Surgeon: Trula Slade, DPM;  Location: [place];  Service: Podiatry;  Laterality: Left;  can start around 5    ARTHROSCOPIC REPAIR ACL Left    AV FISTULA PLACEMENT Right 05/31/2019   Procedure: Brachiocephalic AV fistula creation;  Surgeon: Algernon Huxley, MD;  Location: [name] ORS;  Service: Vascular;  Laterality: Right;   COLONOSCOPY WITH PROPOFOL N/A 10/28/2015   Procedure: COLONOSCOPY WITH PROPOFOL;  Surgeon: Lollie Sails, MD;  Location: [facility] ENDOSCOPY;  Service: Endoscopy;  Laterality: N/A;   COLONOSCOPY WITH PROPOFOL N/A 10/29/2015   Procedure: COLONOSCOPY WITH PROPOFOL;  Surgeon: Lollie Sails, MD;  Location: [facility] ENDOSCOPY;  Service: Endoscopy;  Laterality: N/A;    CORONARY/GRAFT ACUTE MI REVASCULARIZATION N/A 02/26/2020   Procedure: Coronary/Graft Acute MI Revascularization;  Surgeon: Jettie Booze, MD;  Location: [place] CV LAB;  Service: Cardiovascular;  Laterality: N/A;   DIALYSIS/PERMA CATHETER INSERTION Right 04/26/2019   Perm Cath    DIALYSIS/PERMA CATHETER INSERTION N/A 04/26/2019   Procedure: DIALYSIS/PERMA CATHETER INSERTION;  Surgeon: Algernon Huxley, MD;  Location: [place] [facility] LAB;  Service: Cardiovascular;  Laterality: N/A;   DIALYSIS/PERMA CATHETER REMOVAL N/A 08/15/2019   Procedure: DIALYSIS/PERMA CATHETER REMOVAL;  Surgeon: Katha Cabal, MD;  Location: [place] [facility];  Service: Cardiovascular;  Laterality: N/A;   ESOPHAGOGASTRODUODENOSCOPY (EGD) WITH PROPOFOL N/A 12/27/2017   Procedure: ESOPHAGOGASTRODUODENOSCOPY (EGD) WITH PROPOFOL;  Surgeon: Toledo, Benay Pike, MD;  Location: [name] ENDOSCOPY;  Service: Gastroenterology;  Laterality: N/A;   INTRAVASCULAR ULTRASOUND/IVUS N/A 02/26/2020   Procedure: Intravascular Ultrasound/IVUS;  Surgeon: Jettie Booze, MD;  Location: [place] CV LAB;  Service: Cardiovascular;  Laterality: N/A;   LEFT HEART CATH AND CORONARY ANGIOGRAPHY N/A 02/26/2020   Procedure: LEFT HEART CATH AND CORONARY ANGIOGRAPHY;  Surgeon: Jettie Booze, MD;  Location: [place] [facility] LAB;  Service: Cardiovascular;  Laterality: N/A;   PROSTATE SURGERY  2016   TONSILECTOMY/ADENOIDECTOMY WITH MYRINGOTOMY     TONSILLECTOMY       No current facility-administered medications for this visit.   No current outpatient medications on file.   Facility-Administered Medications Ordered in Other Visits  Medication Dose Route Frequency Provider Last Rate Last Admin   acetaminophen (TYLENOL) tablet 650 mg  650 mg Oral Q4H PRN Athena Masse, MD       amiodarone (PACERONE) tablet 200 mg  200 mg Oral Daily End, Christopher, MD       aspirin EC tablet 81 mg  81 mg Oral Daily Judd Gaudier V, MD   81 mg at  11/25/20 0944   atorvastatin (LIPITOR) tablet 80 mg  80 mg Oral QHS Athena Masse, MD   80 mg at 11/25/20 0529   calcium acetate (PHOSLO) capsule 2,001 mg  2,001 mg Oral TID WC Kolluru, Sarath, MD   2,001 mg at 11/25/20 1324   carvedilol (COREG) tablet 12.5 mg  12.5 mg Oral BID WC Wieting, Richard, MD   12.5 mg at 11/25/20 0943   cefTRIAXone (ROCEPHIN) 1 g in sodium chloride 0.9 % 100 mL IVPB  1 g Intravenous Q24H Athena Masse, MD   Stopped at 11/25/20 0609   [START ON 11/26/2020] Chlorhexidine Gluconate Cloth 2 % PADS 6 each  6 each Topical Q0600 Colon Flattery, NP       clopidogrel (PLAVIX) tablet 75 mg  75 mg Oral Daily Rise Mu, PA-C  dorzolamide-timolol (COSOPT) 22.3-6.8 MG/ML ophthalmic solution 1 drop  1 drop Right Eye BID Judd Gaudier V, MD   1 drop at 11/25/20 0945   heparin ADULT infusion 100 units/mL (25000 units/210m)  1,450 Units/hr Intravenous Continuous SLu Duffel RPH 14.5 mL/hr at 11/25/20 1511 1,450 Units/hr at 11/25/20 1511   insulin aspart (novoLOG) injection 0-5 Units  0-5 Units Subcutaneous QHS DAthena Masse MD       insulin aspart (novoLOG) injection 0-6 Units  0-6 Units Subcutaneous TID WC DAthena Masse MD       nitroGLYCERIN (NITROSTAT) SL tablet 0.4 mg  0.4 mg Sublingual Q5 Min x 3 PRN DAthena Masse MD       ondansetron ([facility] injection 4 mg  4 mg Intravenous Q6H PRN DAthena Masse MD        Allergies:   Levofloxacin, Promethazine, Other, and Malt    Social History:  The patient  reports that he has never smoked. He has never used smokeless tobacco. He reports that he does not drink alcohol and does not use drugs.   Family History:  The patient's ***family history includes CAD in his father; Diabetes Mellitus II in his mother; Kidney failure in his mother; Schizophrenia in his mother; Stroke in his father.    ROS:  Please see the history of present illness.   Otherwise, review of systems are positive for ***.   All other  systems are reviewed and negative.    PHYSICAL EXAM: VS:  There were no vitals taken for this visit. , BMI There is no height or weight on file to calculate BMI. GEN: Well nourished, well developed, in no acute distress HEENT: normal Neck: no JVD, carotid bruits, or masses Cardiac: ***RRR; no murmurs, rubs, or gallops,no edema  Respiratory:  clear to auscultation bilaterally, normal work of breathing GI: soft, nontender, nondistended, + BS MS: no deformity or atrophy Skin: warm and dry, no rash Neuro:  Strength and sensation are intact Psych: euthymic mood, full affect   EKG:   The ekg ordered today demonstrates ***   Recent Labs: 11/07/2020: ALT 7 11/24/2020: Magnesium 2.0 11/25/2020: BUN 58; Creatinine, Ser 13.89; Hemoglobin 8.4; Platelets 222; Potassium 4.0; Sodium 141   Lipid Panel    Component Value Date/Time   CHOL 140 11/25/2020 0523   TRIG 62 11/25/2020 0523   HDL 46 11/25/2020 0523   CHOLHDL 3.0 11/25/2020 0523   VLDL 12 11/25/2020 0523   LDLCALC 82 11/25/2020 0523   LDLCALC 102 (H) 04/25/2019 1159     Other studies Reviewed: Additional studies/ records that were reviewed today with results demonstrating: ***.   ASSESSMENT AND PLAN:  CAD/Old MI:  ESRD: PAF: HTN: Hyperlipidemia:   Current medicines are reviewed at length with the patient today.  The patient concerns regarding his medicines were addressed.  The following changes have been made:  No change***  Labs/ tests ordered today include: *** No orders of the defined types were placed in this encounter.   Recommend 150 minutes/week of aerobic exercise Low fat, low carb, high fiber diet recommended  Disposition:   FU in ***   Signed, JLarae Grooms MD  11/25/2020 4:27 PM    [facility] [address] [place]  [id number]: [phone] Fax: [phone]

## 2020-11-25 NOTE — ED Notes (Signed)
Pt assisted by staff to the [facility]. Pt ambulated independently/slowly and endorsed no dizziness or HR changes with ambulation.

## 2020-11-25 NOTE — Progress Notes (Signed)
Patient ID: [name]., male   DOB: May 05, 1970, 50 y.o.   MRN: [id number] [facility] PROGRESS NOTE  Little [name]. [id number] DOB: 1970-12-28 DOA: 11/24/2020 PCP: [name], MD  HPI/Subjective: Patient has noticed some gangrene of one of his fingers going on for few weeks.  States he does dialysis twice a week.  Came in after a syncopal episode after dialysis and was found to be in rapid atrial fibrillation.  Patient has been on antibiotics for an ulcer on his left groin.  Patient currently feeling okay.  Objective: Vitals:   11/25/20 1300 11/25/20 1542  BP: 130/69 138/79  Pulse: 63 64  Resp: 18 17  Temp:  98.7 F (37.1 C)  SpO2: 100% 100%    Intake/Output Summary (Last 24 hours) at 11/25/2020 1819 Last data filed at 11/25/2020 1635 Gross per 24 hour  Intake 367.51 ml  Output --  Net 367.51 ml   Filed Weights   11/24/20 2329  Weight: 93 kg    ROS: Review of Systems  Respiratory:  Negative for shortness of breath.   Cardiovascular:  Negative for chest pain.  Gastrointestinal:  Negative for abdominal pain, nausea and vomiting.  Exam: Physical Exam HENT:     Head: Normocephalic.     Mouth/Throat:     Pharynx: No oropharyngeal exudate.  Eyes:     General: Lids are normal.     Conjunctiva/sclera: Conjunctivae normal.  Cardiovascular:     Rate and Rhythm: Normal rate and regular rhythm.     Heart sounds: Normal heart sounds, S1 normal and S2 normal.  Pulmonary:     Breath sounds: No decreased breath sounds, wheezing, rhonchi or rales.  Abdominal:     Palpations: Abdomen is soft.     Tenderness: There is no abdominal tenderness.  Musculoskeletal:     Right lower leg: No swelling.     Left lower leg: No swelling.  Skin:    General: Skin is warm.     Comments: Right fourth finger gangrene.  Left thigh area of fullness and an ulceration with a little area of a dark scab  Neurological:     Mental Status: He is alert and oriented to person,  place, and time.      Scheduled Meds:  amiodarone  200 mg Oral Daily   aspirin EC  81 mg Oral Daily   atorvastatin  80 mg Oral QHS   calcium acetate  2,001 mg Oral TID WC   carvedilol  12.5 mg Oral BID WC   [START ON 11/26/2020] Chlorhexidine Gluconate Cloth  6 each Topical Q0600   clopidogrel  75 mg Oral Daily   dorzolamide-timolol  1 drop Right Eye BID   insulin aspart  0-5 Units Subcutaneous QHS   insulin aspart  0-6 Units Subcutaneous TID WC   Continuous Infusions:  cefTRIAXone (ROCEPHIN)  IV Stopped (11/25/20 [id number])   heparin 1,450 Units/hr (11/25/20 1635)   Brief history.  50 year old man with end-stage renal disease chronic diastolic congestive heart failure hyperlipidemia, CAD presented with syncopal episode and found to be in rapid atrial fibrillation.  This converted over to normal sinus rhythm.  Patient has noticed gangrene of his right fourth finger going on for few weeks.  Also on antibiotics as outpatient for left thigh infection. Assessment/Plan:  Paroxysmal atrial fibrillation.  Patient was started on heparin drip and Cardizem drip.  Able to come off the Cardizem drip and start oral Coreg and cardiology added back amiodarone. Elevated troponin.  Appreciate cardiology  consultation.  Placed back on aspirin and Plavix and on atorvastatin.  Echocardiogram shows normal EF. Gangrene right fourth finger.  On heparin drip.  Vascular team to take to the procedure lab tomorrow.  Concerned about cardioembolic event from dialysis access End-stage renal disease.  Will need dialysis after diet procedure. Infection left groin on Rocephin.  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 Chronic diastolic congestive heart failure.  No signs of heart failure currently Syncope likely from rapid A. fib. Impaired fasting glucose      Code Status:     Code Status Orders  (From admission, onward)           Start     Ordered   11/24/20 2318  Full code  Continuous        11/24/20 2321            Code Status History     Date Active Date Inactive Code Status Order ID Comments User Context   02/26/2020 1355 02/28/2020 1856 Full Code [id number]  Jettie Booze, MD Inpatient   06/05/2019 0049 06/05/2019 1650 Full Code [id number]  Athena Masse, MD ED   09/13/2018 2343 09/15/2018 1508 Full Code [id number]  Lance Coon, MD Inpatient   11/08/2017 1610 11/10/2017 1511 Full Code [id number]  Henreitta Leber, MD Inpatient   07/18/2017 2120 07/19/2017 1825 Full Code [id number]  Salary, Avel Peace, MD Inpatient   08/18/2016 2314 08/19/2016 1901 Full Code [id number]  Vaughan Basta, MD Inpatient   07/09/2016 0141 07/09/2016 1759 Full Code [id number]  Vaughan Basta, MD ED   03/15/2016 2207 03/17/2016 1753 Full Code [id number]  Etta Quill, DO ED   06/23/2015 2139 06/28/2015 1733 Full Code [id number]  Ivor Costa, MD ED   03/07/2015 0133 03/10/2015 2107 Full Code [id number]  Harrie Foreman, MD Inpatient      Disposition Plan: Status is: Inpatient  Dispo: The patient is from: Home              anticipated d/c is to: Home              Patient currently for angiogram tomorrow for gangrene right finger   Difficult to place patient.  No.  Consultants: Cardiology Vascular surgery Nephrology  Antibiotics: Rocephin  Time spent: 30 minutes, case discussed with cardiology and nephrology  [place]

## 2020-11-25 NOTE — Consult Note (Signed)
[facility] Vascular Consult Note  MRN : [id number]  [name] Rinaldo Scharpf. is a 50 y.o. (1970/10/28) male who presents with chief complaint of  Chief Complaint  Patient presents with   Hypotension   History of Present Illness:  [name] Tydrick Valtierra. is a 50 year old male with medical history significant for DM, HTN, ESRD on HD, diastolic CHF, PAF on amiodarone, not on anticoagulation, history of STEMI 02/2020 s/p DES to LAD,  currently on antibiotics for treatment of cellulitis of his left thigh who was brought to the ED after his family found him confused on his return from dialysis.    Patient has been having recurrent presyncopal and syncopal episodes and falls, over the past few weeks and has had hypotensive episodes related to dialysis He has no evidence of external injuries from his falls.  He has not otherwise been ill.  Denies cough or shortness of breath, chest pain or palpitations and denies fever or chills.  Has had no vomiting or diarrhea.  He does report a possible right fourth finger infection where he tried to clip his nail which appears to be getting worse.  BP was apparently low at dialysis earlier in the day.   ED course:  On arrival, patient was more awake and alert, BP 126/99.  He was found to be in rapid A. fib with rate of 129.  Vitals were otherwise unremarkable. Blood work notable for troponin of 104.  WBC 8.5 with lactic acid 2.6.  Hemoglobin 9.2 with a baseline about 9.2-10.5.  BMP mostly unremarkable.  Procalcitonin 4.17.   Imaging: CT head and C-spine with no acute injury X-ray ring finger left hand: No acute osseous abnormality.  Possible soft tissue ulcer at tip of fourth digit Chest x-ray no active disease   Patient rapidly converted to NSR after diltiazem bolus.  He was given chewable aspirin 324 mg.  Hospitalist consulted for admission.  Patient was found to have gangrenous fingertips to the right hand in the same extremity as his  dialysis access and vascular surgery was consulted by Dr. Damita Dunnings.  Current Facility-Administered Medications  Medication Dose Route Frequency Provider Last Rate Last Admin   acetaminophen (TYLENOL) tablet 650 mg  650 mg Oral Q4H PRN Athena Masse, MD       aspirin EC tablet 81 mg  81 mg Oral Daily Judd Gaudier V, MD   81 mg at 11/25/20 0944   atorvastatin (LIPITOR) tablet 80 mg  80 mg Oral QHS Athena Masse, MD   80 mg at 11/25/20 0529   calcium acetate (PHOSLO) capsule 2,001 mg  2,001 mg Oral TID WC Kolluru, Sarath, MD       carvedilol (COREG) tablet 12.5 mg  12.5 mg Oral BID WC Wieting, Richard, MD   12.5 mg at 11/25/20 0943   cefTRIAXone (ROCEPHIN) 1 g in sodium chloride 0.9 % 100 mL IVPB  1 g Intravenous Q24H Athena Masse, MD   Stopped at 11/25/20 0609   dorzolamide-timolol (COSOPT) 22.3-6.8 MG/ML ophthalmic solution 1 drop  1 drop Right Eye BID Judd Gaudier V, MD   1 drop at 11/25/20 0945   heparin ADULT infusion 100 units/mL (25000 units/269m)  1,450 Units/hr Intravenous Continuous SLu Duffel RPH 14.5 mL/hr at 11/25/20 1020 1,450 Units/hr at 11/25/20 1020   insulin aspart (novoLOG) injection 0-5 Units  0-5 Units Subcutaneous QHS DAthena Masse MD       insulin aspart (novoLOG) injection 0-6 Units  0-6  Units Subcutaneous TID WC Athena Masse, MD       nitroGLYCERIN (NITROSTAT) SL tablet 0.4 mg  0.4 mg Sublingual Q5 Min x 3 PRN Athena Masse, MD       ondansetron [facility]) injection 4 mg  4 mg Intravenous Q6H PRN Athena Masse, MD       Current Outpatient Medications  Medication Sig Dispense Refill   amiodarone (PACERONE) 200 MG tablet Take 200 mg twice a day for 1 week and then start taking 200 mg once a day 40 tablet 0   amoxicillin (AMOXIL) 500 MG capsule Take 500 mg by mouth daily as needed.     aspirin 81 MG chewable tablet CHEW 1 TABLET (81 MG TOTAL) BY MOUTH DAILY. 90 tablet 1   atorvastatin (LIPITOR) 80 MG tablet TAKE 1 TABLET (80 MG TOTAL) BY MOUTH AT  BEDTIME. 90 tablet 0   azelastine (ASTELIN) 0.1 % nasal spray Place 2 sprays into both nostrils 2 (two) times daily.     carvedilol (COREG) 25 MG tablet Take 25 mg by mouth every morning.     clopidogrel (PLAVIX) 75 MG tablet START PLAVIX AND TAKE 300 MG ON 2/7, THEN ON 2/8 TAKE PLAVIX 75 MG DAILY. (Patient taking differently: Take 75 mg by mouth daily. START PLAVIX AND TAKE 300 MG ON 2/7, THEN ON 2/8 TAKE PLAVIX 75 MG DAILY.) 90 tablet 3   dorzolamide-timolol (COSOPT) 22.3-6.8 MG/ML ophthalmic solution Place 1 drop into the right eye 2 (two) times daily.     doxycycline (VIBRAMYCIN) 100 MG capsule Take 1 capsule (100 mg total) by mouth 2 (two) times daily. 20 capsule 0   FEROSUL 325 (65 Fe) MG tablet Take 325 mg by mouth daily.     fluticasone (FLONASE) 50 MCG/ACT nasal spray Place 2 sprays into both nostrils daily.     lidocaine-prilocaine (EMLA) cream Apply 1 application topically Every Tuesday,Thursday,and Saturday with dialysis.      losartan (COZAAR) 25 MG tablet Take 25 mg by mouth daily.     nitroGLYCERIN (NITROSTAT) 0.4 MG SL tablet PLACE 1 TABLET (0.4 MG TOTAL) UNDER THE TONGUE EVERY FIVE MINUTES AS NEEDED. (Patient taking differently: Place 0.4 mg under the tongue every 5 (five) minutes as needed for chest pain.) 25 tablet 2   omeprazole (PRILOSEC) 40 MG capsule Take 40 mg by mouth 2 (two) times daily.     pantoprazole (PROTONIX) 40 MG tablet Take 1 tablet (40 mg total) by mouth daily. 90 tablet 3   prednisoLONE acetate (PRED FORTE) 1 % ophthalmic suspension Place 1 drop into the left eye every 4 (four) hours.     tamsulosin (FLOMAX) 0.4 MG CAPS capsule Take 0.4 mg by mouth daily after breakfast.      Past Medical History:  Diagnosis Date   Anxiety    Cellulitis    CHF (congestive heart failure) (HCC)    Chronic diarrhea    Chronic kidney disease    Chronic pain of both ankles    Diabetes mellitus without complication (HCC)    Diabetes mellitus, type II (HCC)    Diabetic  neuropathy (HCC)    Frequent falls    Gait instability    GERD (gastroesophageal reflux disease)    Heart palpitations    History of kidney stones    HLD (hyperlipidemia)    Hypertension    Insomnia    Nausea and vomiting in adult    recurrent   Past Surgical History:  Procedure Laterality Date  AMPUTATION Left 03/16/2016   Procedure: AMPUTATION DIGIT LEFT HALLUX;  Surgeon: Trula Slade, DPM;  Location: [place];  Service: Podiatry;  Laterality: Left;  can start around 3    ARTHROSCOPIC REPAIR ACL Left    AV FISTULA PLACEMENT Right 05/31/2019   Procedure: Brachiocephalic AV fistula creation;  Surgeon: Algernon Huxley, MD;  Location: [name] ORS;  Service: Vascular;  Laterality: Right;   COLONOSCOPY WITH PROPOFOL N/A 10/28/2015   Procedure: COLONOSCOPY WITH PROPOFOL;  Surgeon: Lollie Sails, MD;  Location: [facility] ENDOSCOPY;  Service: Endoscopy;  Laterality: N/A;   COLONOSCOPY WITH PROPOFOL N/A 10/29/2015   Procedure: COLONOSCOPY WITH PROPOFOL;  Surgeon: Lollie Sails, MD;  Location: [facility] ENDOSCOPY;  Service: Endoscopy;  Laterality: N/A;   CORONARY/GRAFT ACUTE MI REVASCULARIZATION N/A 02/26/2020   Procedure: Coronary/Graft Acute MI Revascularization;  Surgeon: Jettie Booze, MD;  Location: [place] CV LAB;  Service: Cardiovascular;  Laterality: N/A;   DIALYSIS/PERMA CATHETER INSERTION Right 04/26/2019   Perm Cath    DIALYSIS/PERMA CATHETER INSERTION N/A 04/26/2019   Procedure: DIALYSIS/PERMA CATHETER INSERTION;  Surgeon: Algernon Huxley, MD;  Location: [place] CV LAB;  Service: Cardiovascular;  Laterality: N/A;   DIALYSIS/PERMA CATHETER REMOVAL N/A 08/15/2019   Procedure: DIALYSIS/PERMA CATHETER REMOVAL;  Surgeon: Katha Cabal, MD;  Location: [place] CV LAB;  Service: Cardiovascular;  Laterality: N/A;   ESOPHAGOGASTRODUODENOSCOPY (EGD) WITH PROPOFOL N/A 12/27/2017   Procedure: ESOPHAGOGASTRODUODENOSCOPY (EGD) WITH PROPOFOL;  Surgeon: Toledo, Benay Pike, MD;  Location: [name]  ENDOSCOPY;  Service: Gastroenterology;  Laterality: N/A;   INTRAVASCULAR ULTRASOUND/IVUS N/A 02/26/2020   Procedure: Intravascular Ultrasound/IVUS;  Surgeon: Jettie Booze, MD;  Location: [place] CV LAB;  Service: Cardiovascular;  Laterality: N/A;   LEFT HEART CATH AND CORONARY ANGIOGRAPHY N/A 02/26/2020   Procedure: LEFT HEART CATH AND CORONARY ANGIOGRAPHY;  Surgeon: Jettie Booze, MD;  Location: [place] CV LAB;  Service: Cardiovascular;  Laterality: N/A;   PROSTATE SURGERY  2016   TONSILECTOMY/ADENOIDECTOMY WITH MYRINGOTOMY     TONSILLECTOMY     Social History Social History   Tobacco Use   Smoking status: Never   Smokeless tobacco: Never  Vaping Use   Vaping Use: Never used  Substance Use Topics   Alcohol use: No   Drug use: No   Family History Family History  Problem Relation Age of Onset   CAD Father    Stroke Father    Diabetes Mellitus II Mother    Kidney failure Mother    Schizophrenia Mother   Denies family history of peripheral artery disease and venous disease.  Kidney failure on the mother side of the family.  Allergies  Allergen Reactions   Levofloxacin Swelling    Of face Other reaction(s): swelling   Promethazine Diarrhea and Other (See Comments)    Other reaction(s): Other (See Comments), Other (See Comments) Other reaction(s): Muscle cramps Muscle cramps Cramping Other reaction(s): Muscle cramps Muscle cramps   Other     Other reaction(s): Unknown   Malt Other (See Comments)    Other reaction(s): cramping   REVIEW OF SYSTEMS (Negative unless checked)  Constitutional: '[]'$ Weight loss  '[]'$ Fever  '[]'$ Chills Cardiac: '[]'$ Chest pain   '[]'$ Chest pressure   '[]'$ Palpitations   '[]'$ Shortness of breath when laying flat   '[]'$ Shortness of breath at rest   '[]'$ Shortness of breath with exertion. Vascular:  '[]'$ Pain in legs with walking   '[]'$ Pain in legs at rest   '[]'$ Pain in legs when laying flat   '[]'$ Claudication   '[]'$   Pain in feet when walking  '[]'$ Pain in feet at  rest  '[]'$ Pain in feet when laying flat   '[]'$ History of DVT   '[]'$ Phlebitis   '[]'$ Swelling in legs   '[]'$ Varicose veins   '[x]'$ Non-healing ulcers Pulmonary:   '[]'$ Uses home oxygen   '[]'$ Productive cough   '[]'$ Hemoptysis   '[]'$ Wheeze  '[]'$ COPD   '[]'$ Asthma Neurologic:  '[x]'$ Dizziness  '[]'$ Blackouts   '[]'$ Seizures   '[]'$ History of stroke   '[]'$ History of TIA  '[]'$ Aphasia   '[]'$ Temporary blindness   '[]'$ Dysphagia   '[]'$ Weakness or numbness in arms   '[]'$ Weakness or numbness in legs Musculoskeletal:  '[]'$ Arthritis   '[]'$ Joint swelling   '[]'$ Joint pain   '[]'$ Low back pain Hematologic:  '[]'$ Easy bruising  '[]'$ Easy bleeding   '[]'$ Hypercoagulable state   '[]'$ Anemic  '[]'$ Hepatitis Gastrointestinal:  '[]'$ Blood in stool   '[]'$ Vomiting blood  '[]'$ Gastroesophageal reflux/heartburn   '[]'$ Difficulty swallowing. Genitourinary:  '[x]'$ Chronic kidney disease   '[]'$ Difficult urination  '[]'$ Frequent urination  '[]'$ Burning with urination   '[]'$ Blood in urine Skin:  '[]'$ Rashes   '[x]'$ Ulcers   '[x]'$ Wounds Psychological:  '[]'$ History of anxiety   '[]'$  History of major depression.  Physical Examination  Vitals:   11/25/20 0700 11/25/20 0754 11/25/20 0942 11/25/20 0943  BP: 130/76  132/70 132/70  Pulse: 70  65 72  Resp: 18  17   Temp:      TempSrc:      SpO2: 98% 99% 97%   Weight:      Height:       Body mass index is 25.62 kg/m. Gen:  WD/WN, NAD Head: [place]/AT, No temporalis wasting. Prominent temp pulse not noted. Ear/Nose/Throat: Hearing grossly intact, nares w/o erythema or drainage, oropharynx w/o Erythema/Exudate Eyes: Sclera non-icteric, conjunctiva clear Neck: Trachea midline.  No JVD.  Pulmonary:  Good air movement, respirations not labored, equal bilaterally.  Cardiac: RRR, normal S1, S2. Vascular:  Vessel Right Left  Radial Palpable Palpable  Ulnar Palpable Palpable   Right upper extremity:  Dialysis access: Good bruit and thrill.  Skin is intact.  Hand: Hand is warm however it is hard to palpate a radial or ulnar pulse.  There is gangrenous changes to the third fingertip.   Motor/sensory is intact.  Gastrointestinal: soft, non-tender/non-distended. No guarding/reflex.  Musculoskeletal: M/S 5/5 throughout.  Extremities without ischemic changes.  No deformity or atrophy. No edema. Neurologic: Sensation grossly intact in extremities.  Symmetrical.  Speech is fluent. Motor exam as listed above. Psychiatric: Judgment intact, Mood & affect appropriate for pt's clinical situation. Dermatologic: As above  Lymph : No Cervical, Axillary, or Inguinal lymphadenopathy.  CBC Lab Results  Component Value Date   WBC 9.1 11/25/2020   HGB 8.4 (L) 11/25/2020   HCT 26.3 (L) 11/25/2020   MCV 73.9 (L) 11/25/2020   PLT 222 11/25/2020   BMET    Component Value Date/Time   NA 141 11/25/2020 0523   K 4.0 11/25/2020 0523   CL 112 (H) 11/25/2020 0523   CO2 17 (L) 11/25/2020 0523   GLUCOSE 88 11/25/2020 0523   BUN 58 (H) 11/25/2020 0523   CREATININE 13.89 (H) 11/25/2020 0523   CALCIUM 7.7 (L) 11/25/2020 0523   GFRNONAA 4 (L) 11/25/2020 0523   GFRAA 5 (L) 08/27/2019 1540   Estimated Creatinine Clearance: 7.4 mL/min (A) (by C-G formula based on SCr of 13.89 mg/dL (H)).  COAG Lab Results  Component Value Date   INR 1.2 11/24/2020   INR 1.1 02/26/2020   INR 1.1 05/29/2019   Radiology CT HEAD WO CONTRAST (5MM)  Result Date: 11/24/2020 CLINICAL DATA:  Mental status change, unknown cause; Neck trauma, impaired ROM (Age 54-64y) EXAM: CT HEAD WITHOUT CONTRAST CT CERVICAL SPINE WITHOUT CONTRAST TECHNIQUE: Multidetector CT imaging of the head and cervical spine was performed following the standard protocol without intravenous contrast. Multiplanar CT image reconstructions of the cervical spine were also generated. COMPARISON:  07/18/2017 FINDINGS: CT HEAD FINDINGS Brain: No evidence of acute infarction, hemorrhage, hydrocephalus, extra-axial collection or mass lesion/mass effect. Vascular: Atherosclerotic calcifications involving the large vessels of the skull base. No unexpected  hyperdense vessel. Skull: Normal. Negative for fracture or focal lesion. Sinuses/Orbits: No acute finding. Other: None. CT CERVICAL SPINE FINDINGS Alignment: Facet joints are aligned without dislocation or traumatic listhesis. Dens and lateral masses are aligned. Straightening of the cervical lordosis. Skull base and vertebrae: No acute fracture. No primary bone lesion or focal pathologic process. Soft tissues and spinal canal: No prevertebral fluid or swelling. No visible canal hematoma. 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 Minimal facet arthropathy bilaterally. Upper chest: Included lung apices are clear. Other: None. IMPRESSION: 1. No acute intracranial findings. 2. No acute fracture or traumatic listhesis of the cervical spine. 3. 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 Electronically Signed   By: Davina Poke D.O.   On: 11/24/2020 21:29   CT Cervical Spine Wo Contrast  Result Date: 11/24/2020 CLINICAL DATA:  Mental status change, unknown cause; Neck trauma, impaired ROM (Age 43-64y) EXAM: CT HEAD WITHOUT CONTRAST CT CERVICAL SPINE WITHOUT CONTRAST TECHNIQUE: Multidetector CT imaging of the head and cervical spine was performed following the standard protocol without intravenous contrast. Multiplanar CT image reconstructions of the cervical spine were also generated. COMPARISON:  07/18/2017 FINDINGS: CT HEAD FINDINGS Brain: No evidence of acute infarction, hemorrhage, hydrocephalus, extra-axial collection or mass lesion/mass effect. Vascular: Atherosclerotic calcifications involving the large vessels of the skull base. No unexpected hyperdense vessel. Skull: Normal. Negative for fracture or focal lesion. Sinuses/Orbits: No acute finding. Other: None. CT CERVICAL SPINE FINDINGS Alignment: Facet joints are aligned without dislocation or traumatic listhesis. Dens and lateral masses are aligned. Straightening of the cervical lordosis. Skull base and vertebrae: No acute fracture. No primary bone lesion  or focal pathologic process. Soft tissues and spinal canal: No prevertebral fluid or swelling. No visible canal hematoma. 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 Minimal facet arthropathy bilaterally. Upper chest: Included lung apices are clear. Other: None. IMPRESSION: 1. No acute intracranial findings. 2. No acute fracture or traumatic listhesis of the cervical spine. 3. 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 Electronically Signed   By: Davina Poke D.O.   On: 11/24/2020 21:29   CT FEMUR LEFT W CONTRAST  Result Date: 11/06/2020 CLINICAL DATA:  Left thigh abscess EXAM: CT OF THE LOWER RIGHT EXTREMITY WITH CONTRAST TECHNIQUE: Multidetector CT imaging of the lower right extremity was performed according to the standard protocol following intravenous contrast administration. CONTRAST:  178m OMNIPAQUE IOHEXOL 300 MG/ML  SOLN COMPARISON:  None. FINDINGS: Subcutaneous stranding in the posteromedial left gluteal region/upper thigh (series 6/image 85). Additional intramuscular and subcutaneous edema in the medial thigh (series 6/image 159). No drainable fluid collection/abscess. No focal osseous lesions.  No fracture or dislocation is seen. Left hip joint space is preserved. IMPRESSION: Subcutaneous stranding with intramuscular/subcutaneous edema in the left gluteal region and medial thigh. No drainable fluid collection/abscess. Electronically Signed   By: SJulian HyM.D.   On: 11/06/2020 19:29   DG Chest Port 1 View  Result Date: 11/24/2020 CLINICAL DATA:  Weakness EXAM: PORTABLE CHEST  1 VIEW COMPARISON:  11/07/2020, 06/04/2019, 02/26/2020 FINDINGS: Cardiomegaly without edema, pleural effusion, focal opacity or pneumothorax. IMPRESSION: No active disease. Similar cardiomegaly since January 2022, increased compared to radiographs prior to this. Findings could be due to multi chamber enlargement or pericardial effusion. Electronically Signed   By: Donavan Foil M.D.   On: 11/24/2020 21:15   DG  Chest Port 1 View  Result Date: 11/07/2020 CLINICAL DATA:  Weakness EXAM: PORTABLE CHEST 1 VIEW COMPARISON:  Chest radiograph 02/26/2020 FINDINGS: The heart is significantly enlarged, similar to the prior study. The mediastinal contours are stable. There is vascular congestion without overt pulmonary edema. There is no focal consolidation. There is no pleural effusion or pneumothorax. There is no acute osseous abnormality. IMPRESSION: Unchanged marked cardiomegaly with vascular congestion but no overt pulmonary edema. Electronically Signed   By: Valetta Mole M.D.   On: 11/07/2020 12:20   DG Finger Ring Right  Result Date: 11/24/2020 CLINICAL DATA:  Discoloration of ring finger EXAM: RIGHT RING FINGER 2+V COMPARISON:  None. FINDINGS: No fracture or malalignment. Vascular calcifications. Possible ulcer at the tip of the fourth digit. Possible small amount of air under the nail bed. IMPRESSION: No acute osseous abnormality. Possible soft tissue ulcer at the tip of the fourth digit Electronically Signed   By: Donavan Foil M.D.   On: 11/24/2020 21:16   ECHOCARDIOGRAM COMPLETE  Result Date: 11/25/2020    ECHOCARDIOGRAM REPORT   Patient Name:   [name] Aber. Date of Exam: 11/25/2020 Medical Rec #:  [id number]            Height:       75.0 in Accession #:    [id number]           Weight:       205.0 lb Date of Birth:  01-25-1971             BSA:          2.218 m Patient Age:    70 years             BP:           130/76 mmHg Patient Gender: M                    HR:           71 bpm. Exam Location:  [name] Procedure: 2D Echo, Color Doppler, Cardiac Doppler and Strain Analysis Indications:     I21.4 NSTEMI  History:         Patient has prior history of Echocardiogram examinations. CHF,                  CKD; Risk Factors:Hypertension, Diabetes and Dyslipidemia.  Sonographer:     Charmayne Sheer Referring Phys:  [id number] Loletha Grayer Diagnosing Phys: Nelva Bush MD  Sonographer Comments: Suboptimal subcostal window.  Global longitudinal strain was attempted. IMPRESSIONS  1. Left ventricular ejection fraction, by estimation, is 55 to 60%. The left ventricle has normal function. The left ventricle has no regional wall motion abnormalities. The left ventricular internal cavity size was borderline dilated. There is moderate  left ventricular hypertrophy. Left ventricular diastolic parameters are consistent with Grade II diastolic dysfunction (pseudonormalization). Elevated left atrial pressure. The average left ventricular global longitudinal strain is -13.0 %. The global longitudinal strain is abnormal.  2. Right ventricular systolic function is normal. The right ventricular size is mildly enlarged. Tricuspid regurgitation signal is inadequate for assessing PA pressure.  3. Left  atrial size was mild to moderately dilated.  4. Right atrial size was mildly dilated.  5. There is a small to moderate pericardial effusion. There is no evidence of cardiac tamponade.  6. The mitral valve is normal in structure. Trivial mitral valve regurgitation. No evidence of mitral stenosis.  7. The aortic valve is tricuspid. Aortic valve regurgitation is not visualized. No aortic stenosis is present.  8. Aortic dilatation noted. There is borderline dilatation of the aortic root, measuring 38 mm.  9. Mildly dilated pulmonary artery. 10. The inferior vena cava is dilated in size with >50% respiratory variability, suggesting right atrial pressure of 8 mmHg. FINDINGS  Left Ventricle: Left ventricular ejection fraction, by estimation, is 55 to 60%. The left ventricle has normal function. The left ventricle has no regional wall motion abnormalities. The average left ventricular global longitudinal strain is -13.0 %. The global longitudinal strain is abnormal. The left ventricular internal cavity size was borderline dilated. There is moderate left ventricular hypertrophy. Left ventricular diastolic parameters are consistent with Grade II diastolic dysfunction  (pseudonormalization). Elevated left atrial pressure. Right Ventricle: The right ventricular size is mildly enlarged. No increase in right ventricular wall thickness. Right ventricular systolic function is normal. Tricuspid regurgitation signal is inadequate for assessing PA pressure. Left Atrium: Left atrial size was mild to moderately dilated. Right Atrium: Right atrial size was mildly dilated. Pericardium: There is a small to moderate pericardial effusion. There is no evidence of cardiac tamponade. Mitral Valve: The mitral valve is normal in structure. Trivial mitral valve regurgitation. No evidence of mitral valve stenosis. MV peak gradient, 4.8 mmHg. The mean mitral valve gradient is 2.0 mmHg. Tricuspid Valve: The tricuspid valve is normal in structure. Tricuspid valve regurgitation is mild. Aortic Valve: The aortic valve is tricuspid. Aortic valve regurgitation is not visualized. No aortic stenosis is present. Aortic valve mean gradient measures 5.0 mmHg. Aortic valve peak gradient measures 7.7 mmHg. Aortic valve area, by VTI measures 3.71 cm. Pulmonic Valve: The pulmonic valve was normal in structure. Pulmonic valve regurgitation is not visualized. No evidence of pulmonic stenosis. Aorta: Aortic dilatation noted. There is borderline dilatation of the aortic root, measuring 38 mm. Pulmonary Artery: The pulmonary artery is mildly dilated. Venous: The inferior vena cava is dilated in size with greater than 50% respiratory variability, suggesting right atrial pressure of 8 mmHg. IAS/Shunts: The interatrial septum was not well visualized.  LEFT VENTRICLE PLAX 2D LVIDd:         5.63 cm  Diastology LVIDs:         3.56 cm  LV e' medial:    5.87 cm/s LV PW:         1.36 cm  LV E/e' medial:  18.9 LV IVS:        1.21 cm  LV e' lateral:   11.10 cm/s LVOT diam:     2.60 cm  LV E/e' lateral: 10.0 LV SV:         115 LV SV Index:   52       2D Longitudinal Strain LVOT Area:     5.31 cm 2D Strain GLS Avg:     -13.0 %  RIGHT  VENTRICLE RV Basal diam:  4.56 cm TAPSE (M-mode): 2.2 cm LEFT ATRIUM              Index       RIGHT ATRIUM           Index LA diam:  4.80 cm  2.16 cm/m  RA Area:     21.10 cm LA Vol (A2C):   60.0 ml  27.06 ml/m RA Volume:   64.40 ml  29.04 ml/m LA Vol (A4C):   112.0 ml 50.51 ml/m LA Biplane Vol: 89.9 ml  40.54 ml/m  AORTIC VALVE                    PULMONIC VALVE AV Area (Vmax):    3.97 cm     PV Vmax:       1.16 m/s AV Area (Vmean):   3.76 cm     PV Vmean:      76.400 cm/s AV Area (VTI):     3.71 cm     PV VTI:        0.251 m AV Vmax:           139.00 cm/s  PV Peak grad:  5.4 mmHg AV Vmean:          106.000 cm/s PV Mean grad:  3.0 mmHg AV VTI:            0.309 m AV Peak Grad:      7.7 mmHg AV Mean Grad:      5.0 mmHg LVOT Vmax:         104.00 cm/s LVOT Vmean:        75.000 cm/s LVOT VTI:          0.216 m LVOT/AV VTI ratio: 0.70  AORTA Ao Root diam: 3.80 cm MITRAL VALVE MV Area (PHT): 4.26 cm     SHUNTS MV Area VTI:   3.98 cm     Systemic VTI:  0.22 m MV Peak grad:  4.8 mmHg     Systemic Diam: 2.60 cm MV Mean grad:  2.0 mmHg MV Vmax:       1.09 m/s MV Vmean:      67.2 cm/s MV Decel Time: 178 msec MV E velocity: 111.00 cm/s MV A velocity: 53.60 cm/s MV E/A ratio:  2.07 Christopher End MD Electronically signed by Nelva Bush MD Signature Date/Time: 11/25/2020/12:02:59 PM    Final     Assessment/Plan [name] Hardin Dar. is a 50 year old male with medical history significant for DM, HTN, ESRD on HD, diastolic CHF, PAF on amiodarone, not on anticoagulation, history of STEMI 02/2020 s/p DES to LAD,  currently on antibiotics for treatment of cellulitis of his left thigh who was brought to the ED after his family found him confused on his return from dialysis.    1.  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  We have intervened on his dialysis access in the past.  He presents today with the tip of his third finger on the right hand with gangrenous changes.   This is the same extremity his dialysis access has been created.  He notes his access is functioning well however during dialysis his hand does become "cold".  In the setting of gangrenous changes as well as steal like symptoms recommend the patient undergo a right upper extremity angiogram and attempt to assess his anatomy as well as any contributing atherosclerotic disease.  If appropriate, an attempt to revascularize the extremity can be made at that time.  Procedure, risks and benefits were explained to the patient.  All questions were answered.  Patient wishes to proceed.  We will plan on this tomorrow a.m.  2.  End-Stage Renal Disease: Patient he is currently maintained via hemodialysis  through a right upper extremity access. Patient notes no issues with the functioning of his access. Patient is being followed by nephrology.  3.  Type 2 diabetes: Uncontrolled. Encouraged good control as its slows the progression of atherosclerotic disease .  4.  Hyperlipidemia: On aspirin, Plavix and statin for medical management. Encouraged good control as its slows the progression of atherosclerotic disease.  Discussed with Dr. Mayme Genta, PA-C 11/25/2020 12:41 PM  This note was created with Dragon medical transcription system.  Any error is purely unintentional.

## 2020-11-25 NOTE — Consult Note (Signed)
[place] for heparin Indication: chest pain/ACS  Allergies  Allergen Reactions   Levofloxacin Swelling    Of face Other reaction(s): swelling   Promethazine Diarrhea and Other (See Comments)    Other reaction(s): Other (See Comments), Other (See Comments) Other reaction(s): Muscle cramps Muscle cramps Cramping Other reaction(s): Muscle cramps Muscle cramps   Other     Other reaction(s): Unknown   Malt Other (See Comments)    Other reaction(s): cramping    Patient Measurements: Height: '6\' 3"'$  (190.5 cm) Weight: 93 kg (205 lb) IBW/kg (Calculated) : 84.5 Heparin Dosing Weight: 93 kg  Vital Signs: Temp: 98.7 F (37.1 C) (10/04 0230) Temp Source: Oral (10/04 0230) BP: 132/70 (10/04 0943) Pulse Rate: 72 (10/04 0943)  Labs: Recent Labs    11/24/20 1652 11/24/20 2036 11/24/20 2210 11/25/20 0523 11/25/20 0749  HGB 9.2*  --   --  8.4*  --   HCT 29.3*  --   --  26.3*  --   PLT 243  --   --  222  --   APTT  --  35  --   --   --   LABPROT  --  15.6*  --   --   --   INR  --  1.2  --   --   --   HEPARINUNFRC  --   --   --   --  0.17*  CREATININE 12.64*  --   --  13.89*  --   TROPONINIHS 104*  --  731*  --  1,556*     Estimated Creatinine Clearance: 7.4 mL/min (A) (by C-G formula based on SCr of 13.89 mg/dL (H)).   Medical History: Past Medical History:  Diagnosis Date   Anxiety    Cellulitis    CHF (congestive heart failure) (HCC)    Chronic diarrhea    Chronic kidney disease    Chronic pain of both ankles    Diabetes mellitus without complication (HCC)    Diabetes mellitus, type II (HCC)    Diabetic neuropathy (HCC)    Frequent falls    Gait instability    GERD (gastroesophageal reflux disease)    Heart palpitations    History of kidney stones    HLD (hyperlipidemia)    Hypertension    Insomnia    Nausea and vomiting in adult    recurrent    Medications:  No pertinent allergies No PTA anticoagulation  PTA: ASA  81 mg and plavix 75 mg daily   Assessment: 50 y.o. male with a past medical history of anxiety, CHF, DM, GERD, HDL, HTN, ESRD on HD MWF, A. fib not on anticoagulation, CAD, and PVD who presents with complaints of dizziness and syncope. Pt was found to have a troponin elevated to 731 as well as in A. fib with RVR. Pharmacy has been consulted for heparin dosing for ACS.   Baseline labs:  aPTT 35 INR 1.2 Hgb 6 Plts 243  10/04 0749 HL 0.17 @ 1200 units/hr  Goal of Therapy:  Heparin level 0.3-0.7 units/ml Monitor platelets by anticoagulation protocol: Yes   Plan:  Give 2000 units bolus Increase heparin infusion to 1450 units/hr Check anti-Xa level in 6 hours and daily while on heparin Continue to monitor H&H and platelets  Lu Duffel, PharmD, BCPS Clinical Pharmacist 11/25/2020 10:01 AM

## 2020-11-25 NOTE — Consult Note (Signed)
[place] for heparin Indication: chest pain/ACS  Allergies  Allergen Reactions   Levofloxacin Swelling    Of face Other reaction(s): swelling   Promethazine Diarrhea and Other (See Comments)    Other reaction(s): Other (See Comments), Other (See Comments) Other reaction(s): Muscle cramps Muscle cramps Cramping Other reaction(s): Muscle cramps Muscle cramps   Other     Other reaction(s): Unknown   Malt Other (See Comments)    Other reaction(s): cramping    Patient Measurements: Height: '6\' 3"'$  (190.5 cm) Weight: 93 kg (205 lb) IBW/kg (Calculated) : 84.5 Heparin Dosing Weight: 93 kg  Vital Signs: Temp: 98.7 F (37.1 C) (10/04 1542) BP: 138/79 (10/04 1542) Pulse Rate: 64 (10/04 1542)  Labs: Recent Labs    11/24/20 1652 11/24/20 2036 11/24/20 2210 11/25/20 0523 11/25/20 0749 11/25/20 1547 11/25/20 1748  HGB 9.2*  --   --  8.4*  --   --   --   HCT 29.3*  --   --  26.3*  --   --   --   PLT 243  --   --  222  --   --   --   APTT  --  35  --   --   --   --   --   LABPROT  --  15.6*  --   --   --   --   --   INR  --  1.2  --   --   --   --   --   HEPARINUNFRC  --   --   --   --  0.17*  --  0.27*  CREATININE 12.64*  --   --  13.89*  --   --   --   TROPONINIHS 104*  --  731*  --  1,556* 1,422*  --      Estimated Creatinine Clearance: 7.4 mL/min (A) (by C-G formula based on SCr of 13.89 mg/dL (H)).   Medical History: Past Medical History:  Diagnosis Date   Anxiety    Cellulitis    CHF (congestive heart failure) (HCC)    Chronic diarrhea    Chronic kidney disease    Chronic pain of both ankles    Diabetes mellitus without complication (HCC)    Diabetes mellitus, type II (HCC)    Diabetic neuropathy (HCC)    Frequent falls    Gait instability    GERD (gastroesophageal reflux disease)    Heart palpitations    History of kidney stones    HLD (hyperlipidemia)    Hypertension    Insomnia    Nausea and vomiting in adult     recurrent    Medications:  No pertinent allergies No PTA anticoagulation  PTA: ASA 81 mg and plavix 75 mg daily   Assessment: 50 y.o. male with a past medical history of anxiety, CHF, DM, GERD, HDL, HTN, ESRD on HD MWF, A. fib not on anticoagulation, CAD, and PVD who presents with complaints of dizziness and syncope. Pt was found to have a troponin elevated to 731 as well as in A. fib with RVR. Pharmacy has been consulted for heparin dosing for ACS.   Baseline labs:  aPTT 35 INR 1.2 Hgb 6 Plts 243  10/04 0749 HL 0.17 @ 1200 units/hr 10/04 1748 HL 0.27 @ 1450 units/hr  Goal of Therapy:  Heparin level 0.3-0.7 units/ml Monitor platelets by anticoagulation protocol: Yes   Plan:  Give 1300 units bolus Increase heparin infusion to  1600 units/hr Check anti-Xa level in 8 hours and daily while on heparin Continue to monitor H&H and platelets  Pearla Dubonnet, PharmD Clinical Pharmacist 11/25/2020 7:12 PM

## 2020-11-25 NOTE — Consult Note (Signed)
Cardiology Consultation:   Patient ID: [name].; XB:[id number]; [birth year]   Admit date: 11/24/2020 Date of Consult: 11/25/2020  Primary Care Provider: [name], MD Primary Cardiologist: Irish Lack Primary Electrophysiologist:  None   Patient Profile:   [name]. is a 50 y.o. 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 Leslye Peer.  History of Present Illness:   [name] underwent prior echo at [facility] in 06/2017 that showed an EF of 75%, Gr1DD, and mild MR. He has been followed by [facility] for possible renal transplant. He was admitted to the hospital in 02/2020 with an anterior STEMI. Prior to his admission, he was at HD when he developed a sudden episode of hypotension with SOB in 60s and tachycardia at 120s. Per reports, he had completed > 3 hours of dialysis.There was no improvement in BP despite 900cc of fluids at dialysis center. On EMS arrival, EKG showed anterior lead ST elevation with reciprocal change in inferolateral leads, therefore a code STEMI activated. He underwent emergent LHC that showed a high-grade mid LAD stenosis as outlined below without any further significant CAD. He underwent PCI/DES to the mid LAD. His LV function was normal. During the cath, he developed an episode of Afib and was placed on IV amiodarone, converting to sinus rhythm during the procedure, without further arrhythmia during his admission. [place] was not felt to be indicated at that time. He was unable to afford Brilinta and was transitioned to Plavix.  He underwent stress echo at [facility], as part of potential pre-transplant workup, in 07/2020 that  was indeterminate with echo showing a stable mild circumferential pericardial effusion.   He was seen in the ED on 11/06/2020 with right thigh cellulitis.   He was seen in the [facility] ED on 11/07/2020 by dialysis due to hypotension that persisted following normal saline fluid bolus. IN the ED, his BP was noted to be elevated at 198/93. He was noted to be in Afib with EKG showing Afib with RVR. HS-Tn 22 with a delta troponin of 30. Potassium 3.2. Prior to discharge, repeat EKG demonstrated conversion to sinus rhythm. In the ED, he was prescribed amiodarone after discussion with cardiology.   He contacted our office on 11/14/2020 with heart rates in the 50s bpm during dialysis following initiation of amiodarone and while on Coreg. It was recommended he decrease Coreg to 12.5 mg on HD days and to continue 25 mg on non HD days.   He presented to [facility] on 11/24/2020, via EMS, with recurrent near syncope and confusion following hemodialysis. Vitals stable. Initial EKG showed Afib with RVR, 120 bpm, LVH, nonspecific st/t changes. Repeat EKG on the evening of 10/3, following IV diltiazem, showed he had converted to sinus rhythm, and has remained in sinus since. HS-Tn 104 with a delta troponin of 731, currently trended to 1556. PCT 4.17. Blood culture no growth to date. CXR without acute disease with similar cardiomegaly. CT head was without acute intracranial findings. He was also noted to have a possible dry gangrenous right 4th finger, which has been present for about 3 weeks and initially noted after trimming a fingernail.  He has been evaluated by vascular surgery with plans for upper extremity angiogram tomorrow. He tells [place] he developed presyncope while driving home from dialysis on 10/3 and that he lost his vision. This prompted him to pull to the side of the road, though he did eventually finish his drive home. He denied any chest pain, dyspnea, or palpitations. No frank syncope. At home, his family noted he was  confused, prompting them to call EMS to transport to the ED as above. Currently, he is without complaints.    Past Medical History:  Diagnosis Date   Anxiety    Cellulitis    CHF (congestive heart failure) (HCC)    Chronic diarrhea    Chronic kidney disease    Chronic pain of both ankles    Diabetes mellitus without complication (HCC)    Diabetes mellitus, type II (HCC)    Diabetic neuropathy (HCC)    Frequent falls    Gait instability    GERD (gastroesophageal reflux disease)    Heart palpitations    History of kidney stones    HLD (hyperlipidemia)    Hypertension    Insomnia    Nausea and vomiting in adult    recurrent    Past Surgical History:  Procedure Laterality Date   AMPUTATION Left 03/16/2016   Procedure: AMPUTATION DIGIT LEFT HALLUX;  Surgeon: Trula Slade, DPM;  Location: [place];  Service: Podiatry;  Laterality: Left;  can start around 5    ARTHROSCOPIC REPAIR ACL Left    AV FISTULA PLACEMENT Right 05/31/2019   Procedure: Brachiocephalic AV fistula creation;  Surgeon: Algernon Huxley, MD;  Location: [name] ORS;  Service: Vascular;  Laterality: Right;   COLONOSCOPY WITH PROPOFOL N/A 10/28/2015   Procedure: COLONOSCOPY WITH PROPOFOL;  Surgeon: Lollie Sails, MD;  Location: [facility] ENDOSCOPY;  Service: Endoscopy;  Laterality: N/A;   COLONOSCOPY WITH PROPOFOL N/A 10/29/2015   Procedure: COLONOSCOPY WITH PROPOFOL;  Surgeon: Lollie Sails, MD;  Location: [facility] ENDOSCOPY;  Service: Endoscopy;  Laterality: N/A;   CORONARY/GRAFT ACUTE MI REVASCULARIZATION N/A 02/26/2020   Procedure: Coronary/Graft Acute MI Revascularization;  Surgeon: Jettie Booze, MD;  Location: [place] CV LAB;  Service: Cardiovascular;  Laterality: N/A;   DIALYSIS/PERMA CATHETER INSERTION Right 04/26/2019   Perm Cath    DIALYSIS/PERMA CATHETER INSERTION N/A 04/26/2019   Procedure: DIALYSIS/PERMA CATHETER INSERTION;  Surgeon: Algernon Huxley, MD;  Location: [place] CV LAB;  Service: Cardiovascular;   Laterality: N/A;   DIALYSIS/PERMA CATHETER REMOVAL N/A 08/15/2019   Procedure: DIALYSIS/PERMA CATHETER REMOVAL;  Surgeon: Katha Cabal, MD;  Location: [place] CV LAB;  Service: Cardiovascular;  Laterality: N/A;   ESOPHAGOGASTRODUODENOSCOPY (EGD) WITH PROPOFOL N/A 12/27/2017   Procedure: ESOPHAGOGASTRODUODENOSCOPY (EGD) WITH PROPOFOL;  Surgeon: Toledo, Benay Pike, MD;  Location: [name] ENDOSCOPY;  Service: Gastroenterology;  Laterality: N/A;   INTRAVASCULAR ULTRASOUND/IVUS N/A 02/26/2020   Procedure: Intravascular Ultrasound/IVUS;  Surgeon: Jettie Booze, MD;  Location: [place] CV LAB;  Service: Cardiovascular;  Laterality: N/A;   LEFT HEART CATH AND CORONARY ANGIOGRAPHY N/A 02/26/2020   Procedure: LEFT HEART CATH AND CORONARY ANGIOGRAPHY;  Surgeon: Jettie Booze, MD;  Location: [place] CV LAB;  Service: Cardiovascular;  Laterality: N/A;   PROSTATE SURGERY  2016   TONSILECTOMY/ADENOIDECTOMY WITH MYRINGOTOMY     TONSILLECTOMY       Home Meds: Prior to Admission medications   Medication Sig Start Date End Date Taking? Authorizing Provider  amiodarone (PACERONE) 200 MG tablet Take 200 mg  twice a day for 1 week and then start taking 200 mg once a day 11/07/20  Yes Milton Ferguson, MD  amoxicillin (AMOXIL) 500 MG capsule Take 500 mg by mouth daily as needed. 11/07/20  Yes [provider]  aspirin 81 MG chewable tablet CHEW 1 TABLET (81 MG TOTAL) BY MOUTH DAILY. 02/28/20 02/27/21 Yes Reino Bellis B, NP  atorvastatin (LIPITOR) 80 MG tablet TAKE 1 TABLET (80 MG TOTAL) BY MOUTH AT BEDTIME. 02/28/20 02/27/21 Yes Cheryln Manly, NP  azelastine (ASTELIN) 0.1 % nasal spray Place 2 sprays into both nostrils 2 (two) times daily. 09/08/20  Yes [provider]  carvedilol (COREG) 25 MG tablet Take 25 mg by mouth every morning. 08/15/19  Yes [provider]  dorzolamide-timolol (COSOPT) 22.3-6.8 MG/ML ophthalmic solution Place 1 drop into the right eye 2 (two)  times daily. 11/28/19 11/27/20 Yes [provider]  doxycycline (VIBRAMYCIN) 100 MG capsule Take 1 capsule (100 mg total) by mouth 2 (two) times daily. 11/06/20  Yes Triplett, Tammy, PA-C  FEROSUL 325 (65 Fe) MG tablet Take 325 mg by mouth daily. 09/08/20  Yes [provider]  fluticasone (FLONASE) 50 MCG/ACT nasal spray Place 2 sprays into both nostrils daily. 09/08/20  Yes [provider]  losartan (COZAAR) 25 MG tablet Take 25 mg by mouth daily. 10/21/20  Yes [provider]  nitroGLYCERIN (NITROSTAT) 0.4 MG SL tablet PLACE 1 TABLET (0.4 MG TOTAL) UNDER THE TONGUE EVERY FIVE MINUTES AS NEEDED. Patient taking differently: Place 0.4 mg under the tongue every 5 (five) minutes as needed for chest pain. 02/28/20 02/27/21 Yes Cheryln Manly, NP  omeprazole (PRILOSEC) 40 MG capsule Take 40 mg by mouth 2 (two) times daily. 09/08/20  Yes [provider]  pantoprazole (PROTONIX) 40 MG tablet Take 1 tablet (40 mg total) by mouth daily. 03/13/20 03/13/21 Yes Kathyrn Drown D, NP  prednisoLONE acetate (PRED FORTE) 1 % ophthalmic suspension Place 1 drop into the left eye every 4 (four) hours.   Yes [provider]  tamsulosin (FLOMAX) 0.4 MG CAPS capsule Take 0.4 mg by mouth daily after breakfast.    Yes [provider]  clopidogrel (PLAVIX) 75 MG tablet START PLAVIX AND TAKE 300 MG ON 2/7, THEN ON 2/8 TAKE PLAVIX 75 MG DAILY. Patient taking differently: Take 75 mg by mouth daily. START PLAVIX AND TAKE 300 MG ON 2/7, THEN ON 2/8 TAKE PLAVIX 75 MG DAILY. 03/13/20   Kathyrn Drown D, NP  lidocaine-prilocaine (EMLA) cream Apply 1 application topically Every Tuesday,Thursday,and Saturday with dialysis.  08/15/19   [provider]    Inpatient Medications: Scheduled Meds:  aspirin EC  81 mg Oral Daily   atorvastatin  80 mg Oral QHS   calcium acetate  2,001 mg Oral TID WC   carvedilol  12.5 mg Oral BID WC   dorzolamide-timolol  1 drop Right Eye BID    insulin aspart  0-5 Units Subcutaneous QHS   insulin aspart  0-6 Units Subcutaneous TID WC   Continuous Infusions:  cefTRIAXone (ROCEPHIN)  IV Stopped (11/25/20 0609)   heparin 1,450 Units/hr (11/25/20 1020)   PRN Meds: acetaminophen, nitroGLYCERIN, ondansetron (ZOFRAN) IV  Allergies:   Allergies  Allergen Reactions   Levofloxacin Swelling    Of face Other reaction(s): swelling   Promethazine Diarrhea and Other (See Comments)    Other reaction(s): Other (See Comments), Other (See Comments) Other reaction(s): Muscle cramps Muscle cramps Cramping Other reaction(s): Muscle cramps Muscle cramps   Other  Other reaction(s): Unknown   Malt Other (See Comments)    Other reaction(s): cramping    Social History:   Social History   Socioeconomic History   Marital status: Married    Spouse name: [name]    Number of children: 2   Years of education: Not on file   Highest education level: High school graduate  Occupational History   Occupation: Disability    Comment: not employed  Tobacco Use   Smoking status: Never   Smokeless tobacco: Never  Vaping Use   Vaping Use: Never used  Substance and Sexual Activity   Alcohol use: No   Drug use: No   Sexual activity: Yes  Other Topics Concern   Not on file  Social History Narrative   Oldest son killed in car crash June 2020.    Social Determinants of Health   Financial Resource Strain: Not on file  Food Insecurity: Not on file  Transportation Needs: No Transportation Needs   Lack of Transportation (Medical): No   Lack of Transportation (Non-Medical): No  Physical Activity: Not on file  Stress: Not on file  Social Connections: Not on file  Intimate Partner Violence: Not on file     Family History:   Family History  Problem Relation Age of Onset   CAD Father    Stroke Father    Diabetes Mellitus II Mother    Kidney failure Mother    Schizophrenia Mother     ROS:  Review of Systems  Constitutional:   Positive for malaise/fatigue. Negative for chills, diaphoresis, fever and weight loss.  HENT:  Negative for congestion.   Eyes:  Negative for discharge and redness.       Vision loss  Respiratory:  Negative for cough, sputum production, shortness of breath and wheezing.   Cardiovascular:  Negative for chest pain, palpitations, orthopnea, claudication, leg swelling and PND.  Gastrointestinal:  Negative for abdominal pain, heartburn, nausea and vomiting.  Musculoskeletal:  Negative for falls and myalgias.  Skin:  Negative for rash.  Neurological:  Positive for dizziness, weakness and headaches. Negative for tingling, tremors, sensory change, speech change, focal weakness, seizures and loss of consciousness.  Endo/Heme/Allergies:  Does not bruise/bleed easily.  Psychiatric/Behavioral:  Negative for substance abuse. The patient is not nervous/anxious.   All other systems reviewed and are negative.    Physical Exam/Data:   Vitals:   11/25/20 0942 11/25/20 0943 11/25/20 1230 11/25/20 1300  BP: 132/70 132/70 (!) 108/54 130/69  Pulse: 65 72 65 63  Resp: 17   18  Temp:      TempSrc:      SpO2: 97%  99% 100%  Weight:      Height:        Intake/Output Summary (Last 24 hours) at 11/25/2020 1356 Last data filed at 11/25/2020 0609 Gross per 24 hour  Intake 97.06 ml  Output --  Net 97.06 ml   Filed Weights   11/24/20 2329  Weight: 93 kg   Body mass index is 25.62 kg/m.   Physical Exam: General: Well developed, well nourished, in no acute distress. Head: Normocephalic, atraumatic, sclera non-icteric, no xanthomas, nares without discharge.  Neck: Negative for carotid bruits. JVD not elevated. Lungs: Clear bilaterally to auscultation without wheezes, rales, or rhonchi. Breathing is unlabored. Heart: RRR with S1 S2. No murmurs, rubs, or gallops appreciated. Abdomen: Soft, non-tender, non-distended with normoactive bowel sounds. No hepatomegaly. No rebound/guarding. No obvious abdominal  masses. Msk:  Strength and tone appear normal for  age. Extremities: Darkened distal aspect of the right 4th digit. No edema. Distal pedal pulses are 2+ and equal bilaterally. Neuro: Alert and oriented X 3. No facial asymmetry. No focal deficit. Moves all extremities spontaneously. Psych:  Responds to questions appropriately with a normal affect.   EKG:  The EKG was personally reviewed and demonstrates: Initial EKG showed Afib with RVR, 120 bpm, LVH, nonspecific st/t changes. Repeat EKG on the evening of 10/3, showed he had converted to sinus rhythm, 79 bpm, prior anteroseptal infarct, nonspecific lateral st/t changes.  Repeat EKG today shows NSR, 78 bpm, prior anteroseptal infarct, nonspecific lateral st/t changes Telemetry:  Telemetry was personally reviewed and demonstrates: SR  Weights: [employer]   11/24/20 2329  Weight: 93 kg    Relevant CV Studies:  2D echo 11/25/2020: 1. Left ventricular ejection fraction, by estimation, is 55 to 60%. The  left ventricle has normal function. The left ventricle has no regional  wall motion abnormalities. The left ventricular internal cavity size was  borderline dilated. There is moderate   left ventricular hypertrophy. Left ventricular diastolic parameters are  consistent with Grade II diastolic dysfunction (pseudonormalization).  Elevated left atrial pressure. The average left ventricular global  longitudinal strain is -13.0 %. The global  longitudinal strain is abnormal.   2. Right ventricular systolic function is normal. The right ventricular  size is mildly enlarged. Tricuspid regurgitation signal is inadequate for  assessing PA pressure.   3. Left atrial size was mild to moderately dilated.   4. Right atrial size was mildly dilated.   5. There is a small to moderate pericardial effusion. There is no  evidence of cardiac tamponade.   6. The mitral valve is normal in structure. Trivial mitral valve  regurgitation. No evidence of mitral  stenosis.   7. The aortic valve is tricuspid. Aortic valve regurgitation is not  visualized. No aortic stenosis is present.   8. Aortic dilatation noted. There is borderline dilatation of the aortic  root, measuring 38 mm.   9. Mildly dilated pulmonary artery.  10. The inferior vena cava is dilated in size with >50% respiratory  variability, suggesting right atrial pressure of 8 mmHg.  __________  LHC 02/26/2020: The left ventricular systolic function is normal. LV end diastolic pressure is normal. The left ventricular ejection fraction is greater than 65% by visual estimate. There is no aortic valve stenosis. Mid LAD lesion is 90% stenosed. A drug-eluting stent was successfully placed using a STENT RESOLUTE ONYX 3.5X30, postdilated to > 4 mm. Stent optimized with IVUS. Post intervention, there is a 0% residual stenosis.   I suspect his abnormal ECG and anterior injury pattern was more from demand ischemia in the setting of hypotension and AFib with RVR, with severe mid LAD lesion.     During the cath, he converted to NSR briefly and his pulse pressure and BP returned to normal without any pressors. In addition, LVEDP only 11 mm Hg despite having 2.4 L NS administered on transport.  He may have been a little dehydrated after 3 L fluid removal after dialysis.    In regards to AFib.  Continue IV Amiodarone.  Will have to decide if he needs longterm anticoagulation along with antiplatelet therapy.  If so, would change Brilinta to clopidogrel.  Would have to check what options work in the setting of ESRD. __________  2D echo 07/19/2017: - Left ventricle: The cavity size was normal. There was severe    concentric hypertrophy. Systolic function was vigorous.  The    estimated ejection fraction was 75%. Wall motion was normal;    there were no regional wall motion abnormalities. Doppler    parameters are consistent with abnormal left ventricular    relaxation (grade 1 diastolic dysfunction).   - Aortic valve: Valve area (Vmax): 3.6 cm^2.  - Mitral valve: There was mild regurgitation.   Impressions:   - Normal LVEF and wall motion with mild diastolic dysfunction and    severe concentric LVH.  __________   Laboratory Data:  Chemistry Recent Labs  Lab 11/24/20 1652 11/25/20 0523  NA 142 141  K 3.7 4.0  CL 113* 112*  CO2 18* 17*  GLUCOSE 145* 88  BUN 52* 58*  CREATININE 12.64* 13.89*  CALCIUM 7.9* 7.7*  GFRNONAA 4* 4*  ANIONGAP 11 12    No results for input(s): PROT, ALBUMIN, AST, ALT, ALKPHOS, BILITOT in the last 168 hours. Hematology Recent Labs  Lab 11/24/20 1652 11/25/20 0523  WBC 8.5 9.1  RBC 3.89* 3.56*  HGB 9.2* 8.4*  HCT 29.3* 26.3*  MCV 75.3* 73.9*  MCH 23.7* 23.6*  MCHC 31.4 31.9  RDW 19.8* 19.7*  PLT 243 222   Cardiac EnzymesNo results for input(s): TROPONINI in the last 168 hours. No results for input(s): TROPIPOC in the last 168 hours.  BNPNo results for input(s): BNP, PROBNP in the last 168 hours.  DDimer No results for input(s): DDIMER in the last 168 hours.  Radiology/Studies:  CT HEAD WO CONTRAST (5MM)  Result Date: 11/24/2020 IMPRESSION: 1. No acute intracranial findings. 2. No acute fracture or traumatic listhesis of the cervical spine. 3. 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 Electronically Signed   By: Davina Poke D.O.   On: 11/24/2020 21:29   CT Cervical Spine Wo Contrast  Result Date: 11/24/2020 IMPRESSION: 1. No acute intracranial findings. 2. No acute fracture or traumatic listhesis of the cervical spine. 3. 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 Electronically Signed   By: Davina Poke D.O.   On: 11/24/2020 21:29   DG Chest Port 1 View  Result Date: 11/24/2020 IMPRESSION: No active disease. Similar cardiomegaly since January 2022, increased compared to radiographs prior to this. Findings could be due to multi chamber enlargement or pericardial effusion. Electronically Signed   By: Donavan Foil M.D.   On: 11/24/2020 21:15    DG Finger Ring Right  Result Date: 11/24/2020 IMPRESSION: No acute osseous abnormality. Possible soft tissue ulcer at the tip of the fourth digit Electronically Signed   By: Donavan Foil M.D.   On: 11/24/2020 21:16   Assessment and Plan:   1. CAD involving the native coronary arteries with NSTEMI: -No angina -Echo with preserved LVSF and normal wall motion -Continue heparin gtt  -Troponin has trended to 1556, cycle until peak with further recommendations pending -Resume PTA Plavix and ASA, at discharge, would place him on Plavix and Eliquis (given PAF) without ASA in an effort to avoid triple therapy and minimize bleeding risk -Lipitor   2. 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 Pericardial effusion: -Small to moderate without evidence of tamponade physiology  -This date back to at least 2018 per [facility] echo report -Hemodynamically stable -No indication for emergent pericardiocentesis at this time -Fluid management per HD  4. HFpEF: -Volume management per HD  5. Dry gangrene: -Cannot exclude cardioembolic event with known Afib -Vascular surgery planning for upper extremity angiography  -Heparin gtt as above -ASA for now -Resume Plavix    For questions or updates, please contact [place] Please consult  [email or web address] for contact info under Cardiology/STEMI.   Signed, Christell Faith, PA-C [place] Pager: [phone] 11/25/2020, 1:56 PM

## 2020-11-25 NOTE — Progress Notes (Signed)
[facility]  ROUNDING NOTE   Subjective:   [name]. was admitted to [facility] on 11/24/2020 for Postural dizziness with presyncope [R42, R55]  Last hemodialysis treatment was yesterday. Completed 3 and 1/2 hours of his treatment. Left at 88.5kg. he was under his dry weight on initiation of dialysis yesterday.   Patient after dialysis had confusion and syncope. He endorses several weeks of these symptoms post dialysis.   Objective:  Vital signs in last 24 hours:  Temp:  [98.7 F (37.1 C)-98.8 F (37.1 C)] 98.7 F (37.1 C) (10/04 0230) Pulse Rate:  [68-129] 70 (10/04 0700) Resp:  [11-18] 18 (10/04 0700) BP: (116-158)/(56-99) 130/76 (10/04 0700) SpO2:  [96 %-100 %] 99 % (10/04 0754) Weight:  [93 kg] 93 kg (10/03 2329)  Weight change:  Filed Weights   11/24/20 2329  Weight: 93 kg    Intake/Output: I/O last 3 completed shifts: In: 97.1 [IV Piggyback:97.1] Out: -    Intake/Output this shift:  No intake/output data recorded.  Physical Exam: General: NAD, laying on stretcher  Head: Normocephalic, atraumatic. Moist oral mucosal membranes  Eyes: Scleral opacities.   Neck: Supple, trachea midline  Lungs:  Clear to auscultation  Heart: Regular rate and rhythm  Abdomen:  Soft, nontender,   Extremities:  no peripheral edema.  Neurologic: Nonfocal, moving all four extremities  Skin: Left thigh lesion  Access: Right AVF    Basic Metabolic Panel: Recent Labs  Lab 11/24/20 1652 11/25/20 0523  NA 142 141  K 3.7 4.0  CL 113* 112*  CO2 18* 17*  GLUCOSE 145* 88  BUN 52* 58*  CREATININE 12.64* 13.89*  CALCIUM 7.9* 7.7*  MG 2.0  --     Liver Function Tests: No results for input(s): AST, ALT, ALKPHOS, BILITOT, PROT, ALBUMIN in the last 168 hours. No results for input(s): LIPASE, AMYLASE in the last 168 hours. No results for input(s): AMMONIA in the last 168 hours.  CBC: Recent Labs  Lab 11/24/20 1652 11/25/20 0523  WBC 8.5 9.1  NEUTROABS  7.2  --   HGB 9.2* 8.4*  HCT 29.3* 26.3*  MCV 75.3* 73.9*  PLT 243 222    Cardiac Enzymes: No results for input(s): CKTOTAL, CKMB, CKMBINDEX, TROPONINI in the last 168 hours.  BNP: Invalid input(s): POCBNP  CBG: Recent Labs  Lab 11/25/20 0528 11/25/20 0746  GLUCAP 72 78    Microbiology: Results for orders placed or performed during the hospital encounter of 11/24/20  Blood culture (routine single)     Status: None (Preliminary result)   Collection Time: 11/24/20  8:36 PM   Specimen: BLOOD  Result Value Ref Range Status   Specimen Description BLOOD BLOOD LEFT FOREARM  Final   Special Requests   Final    BOTTLES DRAWN AEROBIC AND ANAEROBIC Blood Culture adequate volume   Culture   Final    NO GROWTH < 12 HOURS Performed at [facility], [address] [place], [place] [address]    Report Status PENDING  Incomplete  Resp Panel by RT-PCR (Flu A&B, Covid) Nasopharyngeal Swab     Status: None   Collection Time: 11/24/20  8:36 PM   Specimen: Nasopharyngeal Swab; Nasopharyngeal(NP) swabs in vial transport medium  Result Value Ref Range Status   SARS Coronavirus 2 by RT PCR NEGATIVE NEGATIVE Final    Comment: (NOTE) SARS-CoV-2 target nucleic acids are NOT DETECTED.  The SARS-CoV-2 RNA is generally detectable in upper respiratory specimens during the acute phase of infection.  The lowest concentration of SARS-CoV-2 viral copies this assay can detect is 138 copies/mL. A negative result does not preclude SARS-Cov-2 infection and should not be used as the sole basis for treatment or other patient management decisions. A negative result may occur with  improper specimen collection/handling, submission of specimen other than nasopharyngeal swab, presence of viral mutation(s) within the areas targeted by this assay, and inadequate number of viral copies(<138 copies/mL). A negative result must be combined with clinical observations, patient history, and  epidemiological information. The expected result is Negative.  Fact Sheet for Patients:  EntrepreneurPulse.com.au  Fact Sheet for Healthcare Providers:  IncredibleEmployment.be  This test is no t yet approved or cleared by the [place] FDA and  has been authorized for detection and/or diagnosis of SARS-CoV-2 by FDA under an Emergency Use Authorization (EUA). This EUA will remain  in effect (meaning this test can be used) for the duration of the COVID-19 declaration under Section 564(b)(1) of the Act, 21 U.S.C.section 360bbb-3(b)(1), unless the authorization is terminated  or revoked sooner.       Influenza A by PCR NEGATIVE NEGATIVE Final   Influenza B by PCR NEGATIVE NEGATIVE Final    Comment: (NOTE) The Xpert Xpress SARS-CoV-2/FLU/RSV plus assay is intended as an aid in the diagnosis of influenza from Nasopharyngeal swab specimens and should not be used as a sole basis for treatment. Nasal washings and aspirates are unacceptable for Xpert Xpress SARS-CoV-2/FLU/RSV testing.  Fact Sheet for Patients: EntrepreneurPulse.com.au  Fact Sheet for Healthcare Providers: IncredibleEmployment.be  This test is not yet approved or cleared by the [place] FDA and has been authorized for detection and/or diagnosis of SARS-CoV-2 by FDA under an Emergency Use Authorization (EUA). This EUA will remain in effect (meaning this test can be used) for the duration of the COVID-19 declaration under Section 564(b)(1) of the Act, 21 U.S.C. section 360bbb-3(b)(1), unless the authorization is terminated or revoked.  Performed at [facility], [address] [place], [place] [address]     Coagulation Studies: Recent Labs    11/24/20 05/26/2034  LABPROT 15.6*  INR 1.2    Urinalysis: No results for input(s): COLORURINE, LABSPEC, PHURINE, GLUCOSEU, HGBUR, BILIRUBINUR, KETONESUR, PROTEINUR, UROBILINOGEN,  NITRITE, LEUKOCYTESUR in the last 72 hours.  Invalid input(s): APPERANCEUR    Imaging: CT HEAD WO CONTRAST (5MM)  Result Date: 11/24/2020 CLINICAL DATA:  Mental status change, unknown cause; Neck trauma, impaired ROM (Age 53-64y) EXAM: CT HEAD WITHOUT CONTRAST CT CERVICAL SPINE WITHOUT CONTRAST TECHNIQUE: Multidetector CT imaging of the head and cervical spine was performed following the standard protocol without intravenous contrast. Multiplanar CT image reconstructions of the cervical spine were also generated. COMPARISON:  07/18/2017 FINDINGS: CT HEAD FINDINGS Brain: No evidence of acute infarction, hemorrhage, hydrocephalus, extra-axial collection or mass lesion/mass effect. Vascular: Atherosclerotic calcifications involving the large vessels of the skull base. No unexpected hyperdense vessel. Skull: Normal. Negative for fracture or focal lesion. Sinuses/Orbits: No acute finding. Other: None. CT CERVICAL SPINE FINDINGS Alignment: Facet joints are aligned without dislocation or traumatic listhesis. Dens and lateral masses are aligned. Straightening of the cervical lordosis. Skull base and vertebrae: No acute fracture. No primary bone lesion or focal pathologic process. Soft tissues and spinal canal: No prevertebral fluid or swelling. No visible canal hematoma. 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 Minimal facet arthropathy bilaterally. Upper chest: Included lung apices are clear. Other: None. IMPRESSION: 1. No acute intracranial findings. 2. No acute fracture or traumatic listhesis of the cervical spine. 3.  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 Electronically Signed   By: Davina Poke D.O.   On: 11/24/2020 21:29   CT Cervical Spine Wo Contrast  Result Date: 11/24/2020 CLINICAL DATA:  Mental status change, unknown cause; Neck trauma, impaired ROM (Age 68-64y) EXAM: CT HEAD WITHOUT CONTRAST CT CERVICAL SPINE WITHOUT CONTRAST TECHNIQUE: Multidetector CT imaging of the head and cervical spine  was performed following the standard protocol without intravenous contrast. Multiplanar CT image reconstructions of the cervical spine were also generated. COMPARISON:  07/18/2017 FINDINGS: CT HEAD FINDINGS Brain: No evidence of acute infarction, hemorrhage, hydrocephalus, extra-axial collection or mass lesion/mass effect. Vascular: Atherosclerotic calcifications involving the large vessels of the skull base. No unexpected hyperdense vessel. Skull: Normal. Negative for fracture or focal lesion. Sinuses/Orbits: No acute finding. Other: None. CT CERVICAL SPINE FINDINGS Alignment: Facet joints are aligned without dislocation or traumatic listhesis. Dens and lateral masses are aligned. Straightening of the cervical lordosis. Skull base and vertebrae: No acute fracture. No primary bone lesion or focal pathologic process. Soft tissues and spinal canal: No prevertebral fluid or swelling. No visible canal hematoma. 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 Minimal facet arthropathy bilaterally. Upper chest: Included lung apices are clear. Other: None. IMPRESSION: 1. No acute intracranial findings. 2. No acute fracture or traumatic listhesis of the cervical spine. 3. 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 Electronically Signed   By: Davina Poke D.O.   On: 11/24/2020 21:29   DG Chest Port 1 View  Result Date: 11/24/2020 CLINICAL DATA:  Weakness EXAM: PORTABLE CHEST 1 VIEW COMPARISON:  11/07/2020, 06/04/2019, 02/26/2020 FINDINGS: Cardiomegaly without edema, pleural effusion, focal opacity or pneumothorax. IMPRESSION: No active disease. Similar cardiomegaly since January 2022, increased compared to radiographs prior to this. Findings could be due to multi chamber enlargement or pericardial effusion. Electronically Signed   By: Donavan Foil M.D.   On: 11/24/2020 21:15   DG Finger Ring Right  Result Date: 11/24/2020 CLINICAL DATA:  Discoloration of ring finger EXAM: RIGHT RING FINGER 2+V COMPARISON:  None.  FINDINGS: No fracture or malalignment. Vascular calcifications. Possible ulcer at the tip of the fourth digit. Possible small amount of air under the nail bed. IMPRESSION: No acute osseous abnormality. Possible soft tissue ulcer at the tip of the fourth digit Electronically Signed   By: Donavan Foil M.D.   On: 11/24/2020 21:16     Medications:    cefTRIAXone (ROCEPHIN)  IV Stopped (11/25/20 [id number])   diltiazem (CARDIZEM) infusion 5 mg/hr (11/25/20 0531)   heparin 1,200 Units/hr (11/24/20 2353)    aspirin EC  81 mg Oral Daily   atorvastatin  80 mg Oral QHS   carvedilol  12.5 mg Oral BID WC   dorzolamide-timolol  1 drop Right Eye BID   insulin aspart  0-5 Units Subcutaneous QHS   insulin aspart  0-6 Units Subcutaneous TID WC   acetaminophen, nitroGLYCERIN, ondansetron (ZOFRAN) IV  Assessment/ Plan:  [name]. is a 50 y.o. black male with end stage renal disease on hemodialysis, hypertension, diabetes mellitus type II, diabetic retinopathy, diabetic neuropathy, diabetic gastroparesis, congestive heart failure, hyperlipidemia, BPH who is admitted to [facility] on 11/24/2020 for Postural dizziness with presyncope [R42, R55]  [facility] MWF [facility] Left AVF 93kg  End stage renal disease: hemodialysis treatment yesterday as outpatient. Left well below his dry weight at 89kg. Does not appear volume overloaded. Potassium at goal.  - Schedule dialysis for tomorrow. Patient needs three days a week of dialysis.   Hypertension with ESRD:  130/76. With irregular rhythm on admission. Now back in sinus. History of difficult to control. Current regimen of carvedilol and tamsulosin.  - Pending echocardiogram - Appreciate cardiology input. Patient follows with [facility], Dr. Tobin Chad, as outpatient.   Anemia with chronic kidney disease: hemoglobin 8.4. Microcytic. Iron studies were at goal on 09/01/20.  - Check iron studies - ESA with dialysis treatment.   Secondary  Hyperparathyroidism: with hyperphosphatemia.  - restart calcium acetate with meals.   Diabetes mellitus type II with chronic kidney disease: insulin dependent. Hemoglobin A1c of 5.3% on 08/2020.  Left thigh lesion: diagnosed as cellulitis. Examination is not consistent with calciphylaxis. No induration, nontender on examination. On amoxicillin as outpatient.   Complication of dialysis device: gangrene of third right digit.  - Consult vascular.    LOS: 0 [name] 10/4/20229:32 AM

## 2020-11-26 ENCOUNTER — Other Ambulatory Visit (INDEPENDENT_AMBULATORY_CARE_PROVIDER_SITE_OTHER): Payer: Self-pay | Admitting: Vascular Surgery

## 2020-11-26 ENCOUNTER — Ambulatory Visit: Payer: Medicare Other | Admitting: Interventional Cardiology

## 2020-11-26 ENCOUNTER — Encounter
Admission: EM | Disposition: A | Discharge status: Home / Self Care | Payer: Self-pay | Source: Home / Self Care | Attending: Internal Medicine

## 2020-11-26 DIAGNOSIS — I10 Essential (primary) hypertension: Secondary | ICD-10-CM

## 2020-11-26 DIAGNOSIS — D649 Anemia, unspecified: Secondary | ICD-10-CM | POA: Diagnosis not present

## 2020-11-26 DIAGNOSIS — I480 Paroxysmal atrial fibrillation: Secondary | ICD-10-CM

## 2020-11-26 DIAGNOSIS — N186 End stage renal disease: Secondary | ICD-10-CM

## 2020-11-26 DIAGNOSIS — Z992 Dependence on renal dialysis: Secondary | ICD-10-CM

## 2020-11-26 DIAGNOSIS — I5032 Chronic diastolic (congestive) heart failure: Secondary | ICD-10-CM

## 2020-11-26 DIAGNOSIS — E782 Mixed hyperlipidemia: Secondary | ICD-10-CM

## 2020-11-26 DIAGNOSIS — T82898A Other specified complication of vascular prosthetic devices, implants and grafts, initial encounter: Secondary | ICD-10-CM

## 2020-11-26 DIAGNOSIS — I25118 Atherosclerotic heart disease of native coronary artery with other forms of angina pectoris: Secondary | ICD-10-CM

## 2020-11-26 DIAGNOSIS — I252 Old myocardial infarction: Secondary | ICD-10-CM

## 2020-11-26 HISTORY — PX: UPPER EXTREMITY ANGIOGRAPHY: CATH118270

## 2020-11-26 LAB — BASIC METABOLIC PANEL
Anion gap: 11 (ref 5–15)
BUN: 69 mg/dL — ABNORMAL HIGH (ref 6–20)
CO2: 17 mmol/L — ABNORMAL LOW (ref 22–32)
Calcium: 7.6 mg/dL — ABNORMAL LOW (ref 8.9–10.3)
Chloride: 113 mmol/L — ABNORMAL HIGH (ref 98–111)
Creatinine, Ser: 14.92 mg/dL — ABNORMAL HIGH (ref 0.61–1.24)
GFR, Estimated: 4 mL/min — ABNORMAL LOW (ref >60)
Glucose, Bld: 74 mg/dL (ref 70–99)
Potassium: 4.5 mmol/L (ref 3.5–5.1)
Sodium: 141 mmol/L (ref 135–145)

## 2020-11-26 LAB — LIPID PANEL
Cholesterol: 122 mg/dL (ref 0–200)
HDL: 45 mg/dL (ref >40)
LDL Cholesterol: 61 mg/dL (ref 0–99)
Total CHOL/HDL Ratio: 2.7 RATIO
Triglycerides: 78 mg/dL (ref <150)
VLDL: 16 mg/dL (ref 0–40)

## 2020-11-26 LAB — HEPARIN LEVEL (UNFRACTIONATED)
Heparin Unfractionated: 0.36 IU/mL (ref 0.30–0.70)
Heparin Unfractionated: 0.12 IU/mL — ABNORMAL LOW (ref 0.30–0.70)
Heparin Unfractionated: 0.16 IU/mL — ABNORMAL LOW (ref 0.30–0.70)

## 2020-11-26 LAB — CBC
HCT: 26.3 % — ABNORMAL LOW (ref 39.0–52.0)
Hemoglobin: 8 g/dL — ABNORMAL LOW (ref 13.0–17.0)
MCH: 23.2 pg — ABNORMAL LOW (ref 26.0–34.0)
MCHC: 30.4 g/dL (ref 30.0–36.0)
MCV: 76.2 fL — ABNORMAL LOW (ref 80.0–100.0)
Platelets: 197 10*3/uL (ref 150–400)
RBC: 3.45 MIL/uL — ABNORMAL LOW (ref 4.22–5.81)
RDW: 19.9 % — ABNORMAL HIGH (ref 11.5–15.5)
WBC: 10 10*3/uL (ref 4.0–10.5)
nRBC: 0.4 % — ABNORMAL HIGH (ref 0.0–0.2)

## 2020-11-26 LAB — HEPATITIS B SURFACE ANTIBODY, QUANTITATIVE: Hep B S AB Quant (Post): 107.1 m[IU]/mL (ref >9.9)

## 2020-11-26 LAB — TROPONIN I (HIGH SENSITIVITY): Troponin I (High Sensitivity): 913 ng/L (ref <18)

## 2020-11-26 LAB — HEPATITIS B SURFACE ANTIBODY,QUALITATIVE: Hep B S Ab: REACTIVE — AB

## 2020-11-26 LAB — MRSA NEXT GEN BY PCR, NASAL: MRSA by PCR Next Gen: NOT DETECTED

## 2020-11-26 LAB — GLUCOSE, CAPILLARY
Glucose-Capillary: 79 mg/dL (ref 70–99)
Glucose-Capillary: 126 mg/dL — ABNORMAL HIGH (ref 70–99)

## 2020-11-26 SURGERY — UPPER EXTREMITY ANGIOGRAPHY
Anesthesia: Moderate Sedation | Laterality: Right

## 2020-11-26 MED ORDER — SODIUM CHLORIDE 0.9 % IV SOLN
INTRAVENOUS | Status: DC
  Administered 2020-11-26: 1000 mL via INTRAVENOUS

## 2020-11-26 MED ORDER — HEPARIN (PORCINE) 25000 UT/250ML-% IV SOLN: 1600.0000 [IU]/h | INTRAVENOUS | Status: DC

## 2020-11-26 MED ORDER — HYDROMORPHONE HCL 1 MG/ML IJ SOLN: 1.0000 mg | Freq: Once | INTRAMUSCULAR | Status: DC | PRN

## 2020-11-26 MED ORDER — MIDAZOLAM HCL 5 MG/5ML IJ SOLN
INTRAMUSCULAR | Status: AC
  Filled 2020-11-26: qty 5

## 2020-11-26 MED ORDER — FENTANYL CITRATE PF 50 MCG/ML IJ SOSY
PREFILLED_SYRINGE | INTRAMUSCULAR | Status: AC
  Filled 2020-11-26: qty 2

## 2020-11-26 MED ORDER — HEPARIN SODIUM (PORCINE) 1000 UNIT/ML IJ SOLN
INTRAMUSCULAR | Status: DC | PRN
  Administered 2020-11-26: 3000 [IU] via INTRAVENOUS

## 2020-11-26 MED ORDER — CHLORHEXIDINE GLUCONATE CLOTH 2 % EX PADS
6.0000 | MEDICATED_PAD | Freq: Once | CUTANEOUS | Status: AC
  Administered 2020-11-26: 6 via TOPICAL

## 2020-11-26 MED ORDER — HEPARIN SODIUM (PORCINE) 1000 UNIT/ML IJ SOLN
INTRAMUSCULAR | Status: AC
  Filled 2020-11-26: qty 1

## 2020-11-26 MED ORDER — CEFAZOLIN SODIUM-DEXTROSE 1-4 GM/50ML-% IV SOLN
INTRAVENOUS | Status: AC
  Filled 2020-11-26: qty 50

## 2020-11-26 MED ORDER — FAMOTIDINE 20 MG PO TABS: 40.0000 mg | ORAL_TABLET | Freq: Once | ORAL | Status: DC | PRN

## 2020-11-26 MED ORDER — METHYLPREDNISOLONE SODIUM SUCC 125 MG IJ SOLR: 125.0000 mg | Freq: Once | INTRAMUSCULAR | Status: DC | PRN

## 2020-11-26 MED ORDER — CEFAZOLIN SODIUM-DEXTROSE 1-4 GM/50ML-% IV SOLN
INTRAVENOUS | Status: AC
  Administered 2020-11-26: 1 g via INTRAVENOUS
  Filled 2020-11-26: qty 50

## 2020-11-26 MED ORDER — MIDAZOLAM HCL 2 MG/2ML IJ SOLN
INTRAMUSCULAR | Status: DC | PRN
  Administered 2020-11-26: 2 mg via INTRAVENOUS

## 2020-11-26 MED ORDER — DIPHENHYDRAMINE HCL 50 MG/ML IJ SOLN: 50.0000 mg | Freq: Once | INTRAMUSCULAR | Status: DC | PRN

## 2020-11-26 MED ORDER — CEFAZOLIN SODIUM-DEXTROSE 1-4 GM/50ML-% IV SOLN: 1.0000 g | Freq: Once | INTRAVENOUS | Status: AC

## 2020-11-26 MED ORDER — MIDAZOLAM HCL 2 MG/ML PO SYRP: 8.0000 mg | ORAL_SOLUTION | Freq: Once | ORAL | Status: DC | PRN

## 2020-11-26 MED ORDER — HYDRALAZINE HCL 20 MG/ML IJ SOLN: 10.0000 mg | Freq: Four times a day (QID) | INTRAMUSCULAR | Status: DC | PRN

## 2020-11-26 MED ORDER — HEPARIN BOLUS VIA INFUSION
3000.0000 [IU] | Freq: Once | INTRAVENOUS | Status: AC
  Administered 2020-11-26: 3000 [IU] via INTRAVENOUS
  Filled 2020-11-26: qty 3000

## 2020-11-26 MED ORDER — HEPARIN (PORCINE) 25000 UT/250ML-% IV SOLN
1800.0000 [IU]/h | INTRAVENOUS | Status: DC
  Administered 2020-11-26: 1600 [IU]/h via INTRAVENOUS
  Administered 2020-11-27: 1800 [IU]/h via INTRAVENOUS
  Filled 2020-11-26: qty 250

## 2020-11-26 MED ORDER — ONDANSETRON HCL 4 MG/2ML IJ SOLN: 4.0000 mg | Freq: Four times a day (QID) | INTRAMUSCULAR | Status: DC | PRN

## 2020-11-26 MED ORDER — CEFAZOLIN SODIUM-DEXTROSE 1-4 GM/50ML-% IV SOLN
1.0000 g | INTRAVENOUS | Status: AC
  Administered 2020-11-27: 1 g via INTRAVENOUS
  Filled 2020-11-26: qty 50

## 2020-11-26 MED ORDER — IODIXANOL 320 MG/ML IV SOLN
INTRAVENOUS | Status: DC | PRN
  Administered 2020-11-26: 40 mL via INTRA_ARTERIAL

## 2020-11-26 MED ORDER — FENTANYL CITRATE (PF) 100 MCG/2ML IJ SOLN
INTRAMUSCULAR | Status: DC | PRN
  Administered 2020-11-26: 50 ug via INTRAVENOUS

## 2020-11-26 SURGICAL SUPPLY — 15 items
CATH ANGIO 5F PIGTAIL 100CM (CATHETERS) ×1 IMPLANT
CATH BALLN ULTRV RX 2X100X200 (CATHETERS) ×1 IMPLANT
CATH BEACON 5 .035 100 H1 TIP (CATHETERS) ×1 IMPLANT
CATH NAVICROSS ANGLED 135CM (MICROCATHETER) ×1 IMPLANT
CATH SEEKER .018X150 (CATHETERS) ×1 IMPLANT
COVER PROBE U/S 5X48 (MISCELLANEOUS) ×1 IMPLANT
DEVICE STARCLOSE SE CLOSURE (Vascular Products) ×1 IMPLANT
DEVICE TORQUE (MISCELLANEOUS) ×1 IMPLANT
GLIDEWIRE ADV .014X300CM (WIRE) ×1 IMPLANT
GLIDEWIRE ANGLED SS 035X260CM (WIRE) ×1 IMPLANT
KIT ENCORE 26 ADVANTAGE (KITS) ×1 IMPLANT
SHEATH BRITE TIP 5FRX11 (SHEATH) ×1 IMPLANT
SHEATH BRITE TIP 6FRX5.5 (SHEATH) ×1 IMPLANT
TUBING CONTRAST HIGH PRESS 72 (TUBING) ×1 IMPLANT
WIRE GUIDERIGHT .035X150 (WIRE) ×1 IMPLANT

## 2020-11-26 NOTE — Consult Note (Signed)
[facility] for heparin Indication: chest pain/ACS  Allergies  Allergen Reactions   Levofloxacin Swelling    Of face Other reaction(s): swelling   Promethazine Diarrhea and Other (See Comments)    Other reaction(s): Other (See Comments), Other (See Comments) Other reaction(s): Muscle cramps Muscle cramps Cramping Other reaction(s): Muscle cramps Muscle cramps   Other     Other reaction(s): Unknown   Malt Other (See Comments)    Other reaction(s): cramping    Patient Measurements: Height: '6\' 3"'$  (190.5 cm) Weight: 93 kg (205 lb) IBW/kg (Calculated) : 84.5 Heparin Dosing Weight: 93 kg  Vital Signs: Temp: 97.9 F (36.6 C) (10/05 0000) Temp Source: Oral (10/05 0000) BP: 131/64 (10/05 0000) Pulse Rate: 76 (10/05 0000)  Labs: Recent Labs    11/24/20 1652 11/24/20 2036 11/24/20 2210 11/25/20 0523 11/25/20 0749 11/25/20 1547 11/25/20 1748 11/26/20 0257  HGB 9.2*  --   --  8.4*  --   --   --  8.0*  HCT 29.3*  --   --  26.3*  --   --   --  26.3*  PLT 243  --   --  222  --   --   --  197  APTT  --  35  --   --   --   --   --   --   LABPROT  --  15.6*  --   --   --   --   --   --   INR  --  1.2  --   --   --   --   --   --   HEPARINUNFRC  --   --   --   --  0.17*  --  0.27* 0.36  CREATININE 12.64*  --   --  13.89*  --   --   --  14.92*  TROPONINIHS 104*  --  731*  --  1,556* 1,422*  --   --      Estimated Creatinine Clearance: 6.9 mL/min (A) (by C-G formula based on SCr of 14.92 mg/dL (H)).   Medical History: Past Medical History:  Diagnosis Date   Anxiety    Cellulitis    CHF (congestive heart failure) (HCC)    Chronic diarrhea    Chronic kidney disease    Chronic pain of both ankles    Diabetes mellitus without complication (HCC)    Diabetes mellitus, type II (HCC)    Diabetic neuropathy (HCC)    Frequent falls    Gait instability    GERD (gastroesophageal reflux disease)    Heart palpitations    History of kidney  stones    HLD (hyperlipidemia)    Hypertension    Insomnia    Nausea and vomiting in adult    recurrent    Medications:  No pertinent allergies No PTA anticoagulation  PTA: ASA 81 mg and plavix 75 mg daily   Assessment: 50 y.o. male with a past medical history of anxiety, CHF, DM, GERD, HDL, HTN, ESRD on HD MWF, A. fib not on anticoagulation, CAD, and PVD who presents with complaints of dizziness and syncope. Pt was found to have a troponin elevated to 731 as well as in A. fib with RVR. Pharmacy has been consulted for heparin dosing for ACS.   Baseline labs:  aPTT 35 INR 1.2 Hgb 6 Plts 243  10/04 0749 HL 0.17 @ 1200 units/hr 10/04 1748 HL 0.27 @ 1450 units/hr 10/05 0257 HL  0.36 @ 1600 units/hr, therapeutic x 1  Goal of Therapy:  Heparin level 0.3-0.7 units/ml Monitor platelets by anticoagulation protocol: Yes   Plan:  Continue heparin infusion to 1600 units/hr Recheck HL in 8 hours to confirm Continue to monitor H&H and platelets  Renda Rolls, PharmD, [facility] 11/26/2020 4:14 AM

## 2020-11-26 NOTE — Progress Notes (Signed)
PROGRESS NOTE    [name].  [id number] DOB: 1970/05/08 DOA: 11/24/2020 PCP: [name], MD    Brief Narrative:   50 year old man with end-stage renal disease chronic diastolic congestive heart failure hyperlipidemia, CAD presented with syncopal episode and found to be in rapid atrial fibrillation.  This converted over to normal sinus rhythm.  Patient has noticed ischemia of his right fourth finger going on for few weeks.  Also on antibiotics as outpatient for left thigh infection.  Assessment & Plan:   Principal Problem:   Syncope Active Problems:   Sepsis ([place])   Essential hypertension, benign   Type II diabetes mellitus (HCC)   Chronic diastolic heart failure (HCC)   Chronic anemia   History of ST elevation myocardial infarction (STEMI) 02/2020 s/p DES to LAD   ESRD (end stage renal disease) (HCC)   Elevated troponin   Left leg cellulitis   Frequent falls   Finger infection, fourth left   AF (paroxysmal atrial fibrillation) (HCC)   Hypotension   NSTEMI (non-ST elevated myocardial infarction) ([place])   Gangrene of finger of right hand ([place])  #1.  Non-STEMI. Paroxysmal atrial fibrillation. Chronic diastolic congestive heart failure. Syncope. Patient is a followed by cardiology, peak troponin has increased to 1558. Continue heparin drip. Currently has no evidence of volume overload, he has been dialyzed today.  2.  Right fourth finger ischemia. Patient had angiogram today, planning to banding the AV fistula to reduce the flow, so that blood flow to the fingers can be increased.  Patient preferred to go home after procedure.  3.  End-stage renal disease. Anemia of chronic kidney disease. Followed by nephrology.  Right groin infection. On Rocephin.    DVT prophylaxis: Heparin Code Status: full Family Communication:  Disposition Plan:    Status is: Inpatient  Remains inpatient appropriate because:Ongoing diagnostic testing needed not appropriate  for outpatient work up and Inpatient level of care appropriate due to severity of illness  Dispo: The patient is from: Home              Anticipated d/c is to: Home              Patient currently is not medically stable to d/c.   Difficult to place patient No        I/O last 3 completed shifts: In: 734.6 [P.O.:240; I.V.:397.5; IV Piggyback:97.1] Out: -  Total I/O In: 472.4 [P.O.:240; I.V.:133.6; IV Piggyback:98.7] Out: -      Consultants:  Nephrology and vascular surgery.  Procedures:   Antimicrobials: Rocephin. Subjective: Patient doing well, no significant short of breath or cough. Finger pain is better. No abdominal pain nausea vomiting. No fever or chills. No dysuria or hematuria.  Objective: Vitals:   11/26/20 1445 11/26/20 1500 11/26/20 1615 11/26/20 1630  BP: (!) 160/79 (!) 158/79 (!) 183/83 (!) 182/75  Pulse: 64 66 70 70  Resp: '10 10 11 12  '$ Temp:      TempSrc:      SpO2: 100% 100% 100% 100%  Weight:      Height:        Intake/Output Summary (Last 24 hours) at 11/26/2020 1641 Last data filed at 11/26/2020 1342 Gross per 24 hour  Intake 839.42 ml  Output --  Net 839.42 ml   Filed Weights   11/24/20 2329 11/26/20 0755 11/26/20 1315  Weight: 93 kg 90.7 kg (P) 91.5 kg    Examination:  General exam: Appears calm and comfortable  Respiratory system: Clear to auscultation.  Respiratory effort normal. Cardiovascular system: S1 & S2 heard, RRR. No JVD, murmurs, rubs, gallops or clicks. No pedal edema. Gastrointestinal system: Abdomen is nondistended, soft and nontender. No organomegaly or masses felt. Normal bowel sounds heard. Central nervous system: Alert and oriented. No focal neurological deficits. Extremities: Symmetric 5 x 5 power. Skin: No rashes, lesions or ulcers Psychiatry: Judgement and insight appear normal. Mood & affect appropriate.     Data Reviewed: I have personally reviewed following labs and imaging studies  CBC: Recent Labs   Lab 11/24/20 1652 11/25/20 0523 11/26/20 0257  WBC 8.5 9.1 10.0  NEUTROABS 7.2  --   --   HGB 9.2* 8.4* 8.0*  HCT 29.3* 26.3* 26.3*  MCV 75.3* 73.9* 76.2*  PLT 243 222 [id number]   Basic Metabolic Panel: Recent Labs  Lab 11/24/20 1652 11/25/20 0523 11/26/20 0257  NA 142 141 141  K 3.7 4.0 4.5  CL 113* 112* 113*  CO2 18* 17* 17*  GLUCOSE 145* 88 74  BUN 52* 58* 69*  CREATININE 12.64* 13.89* 14.92*  CALCIUM 7.9* 7.7* 7.6*  MG 2.0  --   --    GFR: Estimated Creatinine Clearance: 6.9 mL/min (A) (by C-G formula based on SCr of 14.92 mg/dL (H)). Liver Function Tests: No results for input(s): AST, ALT, ALKPHOS, BILITOT, PROT, ALBUMIN in the last 168 hours. No results for input(s): LIPASE, AMYLASE in the last 168 hours. No results for input(s): AMMONIA in the last 168 hours. Coagulation Profile: Recent Labs  Lab 11/24/20 2036  INR 1.2   Cardiac Enzymes: No results for input(s): CKTOTAL, CKMB, CKMBINDEX, TROPONINI in the last 168 hours. BNP (last 3 results) No results for input(s): PROBNP in the last 8760 hours. HbA1C: Recent Labs    11/24/20 2331  HGBA1C 5.2   CBG: Recent Labs  Lab 11/25/20 1147 11/25/20 1636 11/25/20 2121 11/26/20 0758 11/26/20 1212  GLUCAP 96 115* 97 79 126*   Lipid Profile: Recent Labs    11/25/20 0523 11/26/20 0257  CHOL 140 122  HDL 46 45  LDLCALC 82 61  TRIG 62 78  CHOLHDL 3.0 2.7   Thyroid Function Tests: Recent Labs    11/25/20 1547  TSH 3.908   Anemia Panel: No results for input(s): VITAMINB12, FOLATE, FERRITIN, TIBC, IRON, RETICCTPCT in the last 72 hours. Sepsis Labs: Recent Labs  Lab 11/24/20 1652 11/24/20 1656 11/24/20 2036  PROCALCITON 4.17  --   --   LATICACIDVEN  --  2.6* 1.6    Recent Results (from the past 240 hour(s))  Blood culture (routine single)     Status: None (Preliminary result)   Collection Time: 11/24/20  8:36 PM   Specimen: BLOOD  Result Value Ref Range Status   Specimen Description BLOOD  BLOOD LEFT FOREARM  Final   Special Requests   Final    BOTTLES DRAWN AEROBIC AND ANAEROBIC Blood Culture adequate volume   Culture   Final    NO GROWTH 2 DAYS Performed at [facility], [address], [place], [place] [address]    Report Status PENDING  Incomplete  Resp Panel by RT-PCR (Flu A&B, Covid) Nasopharyngeal Swab     Status: None   Collection Time: 11/24/20  8:36 PM   Specimen: Nasopharyngeal Swab; Nasopharyngeal(NP) swabs in vial transport medium  Result Value Ref Range Status   SARS Coronavirus 2 by RT PCR NEGATIVE NEGATIVE Final    Comment: (NOTE) SARS-CoV-2 target nucleic acids are NOT DETECTED.  The SARS-CoV-2 RNA is generally detectable  in upper respiratory specimens during the acute phase of infection. The lowest concentration of SARS-CoV-2 viral copies this assay can detect is 138 copies/mL. A negative result does not preclude SARS-Cov-2 infection and should not be used as the sole basis for treatment or other patient management decisions. A negative result may occur with  improper specimen collection/handling, submission of specimen other than nasopharyngeal swab, presence of viral mutation(s) within the areas targeted by this assay, and inadequate number of viral copies(<138 copies/mL). A negative result must be combined with clinical observations, patient history, and epidemiological information. The expected result is Negative.  Fact Sheet for Patients:  EntrepreneurPulse.com.au  Fact Sheet for Healthcare Providers:  IncredibleEmployment.be  This test is no t yet approved or cleared by the [place] FDA and  has been authorized for detection and/or diagnosis of SARS-CoV-2 by FDA under an Emergency Use Authorization (EUA). This EUA will remain  in effect (meaning this test can be used) for the duration of the COVID-19 declaration under Section 564(b)(1) of the Act, 21 U.S.C.section 360bbb-3(b)(1), unless  the authorization is terminated  or revoked sooner.       Influenza A by PCR NEGATIVE NEGATIVE Final   Influenza B by PCR NEGATIVE NEGATIVE Final    Comment: (NOTE) The Xpert Xpress SARS-CoV-2/FLU/RSV plus assay is intended as an aid in the diagnosis of influenza from Nasopharyngeal swab specimens and should not be used as a sole basis for treatment. Nasal washings and aspirates are unacceptable for Xpert Xpress SARS-CoV-2/FLU/RSV testing.  Fact Sheet for Patients: EntrepreneurPulse.com.au  Fact Sheet for Healthcare Providers: IncredibleEmployment.be  This test is not yet approved or cleared by the [place] FDA and has been authorized for detection and/or diagnosis of SARS-CoV-2 by FDA under an Emergency Use Authorization (EUA). This EUA will remain in effect (meaning this test can be used) for the duration of the COVID-19 declaration under Section 564(b)(1) of the Act, 21 U.S.C. section 360bbb-3(b)(1), unless the authorization is terminated or revoked.  Performed at [facility], [address] [place], [place] [address]          Radiology Studies: CT HEAD WO CONTRAST (5MM)  Result Date: 11/24/2020 CLINICAL DATA:  Mental status change, unknown cause; Neck trauma, impaired ROM (Age 81-64y) EXAM: CT HEAD WITHOUT CONTRAST CT CERVICAL SPINE WITHOUT CONTRAST TECHNIQUE: Multidetector CT imaging of the head and cervical spine was performed following the standard protocol without intravenous contrast. Multiplanar CT image reconstructions of the cervical spine were also generated. COMPARISON:  07/18/2017 FINDINGS: CT HEAD FINDINGS Brain: No evidence of acute infarction, hemorrhage, hydrocephalus, extra-axial collection or mass lesion/mass effect. Vascular: Atherosclerotic calcifications involving the large vessels of the skull base. No unexpected hyperdense vessel. Skull: Normal. Negative for fracture or focal lesion. Sinuses/Orbits:  No acute finding. Other: None. CT CERVICAL SPINE FINDINGS Alignment: Facet joints are aligned without dislocation or traumatic listhesis. Dens and lateral masses are aligned. Straightening of the cervical lordosis. Skull base and vertebrae: No acute fracture. No primary bone lesion or focal pathologic process. Soft tissues and spinal canal: No prevertebral fluid or swelling. No visible canal hematoma. 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 Minimal facet arthropathy bilaterally. Upper chest: Included lung apices are clear. Other: None. IMPRESSION: 1. No acute intracranial findings. 2. No acute fracture or traumatic listhesis of the cervical spine. 3. 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 Electronically Signed   By: Davina Poke D.O.   On: 11/24/2020 21:29   CT Cervical Spine Wo Contrast  Result Date: 11/24/2020  CLINICAL DATA:  Mental status change, unknown cause; Neck trauma, impaired ROM (Age 25-64y) EXAM: CT HEAD WITHOUT CONTRAST CT CERVICAL SPINE WITHOUT CONTRAST TECHNIQUE: Multidetector CT imaging of the head and cervical spine was performed following the standard protocol without intravenous contrast. Multiplanar CT image reconstructions of the cervical spine were also generated. COMPARISON:  07/18/2017 FINDINGS: CT HEAD FINDINGS Brain: No evidence of acute infarction, hemorrhage, hydrocephalus, extra-axial collection or mass lesion/mass effect. Vascular: Atherosclerotic calcifications involving the large vessels of the skull base. No unexpected hyperdense vessel. Skull: Normal. Negative for fracture or focal lesion. Sinuses/Orbits: No acute finding. Other: None. CT CERVICAL SPINE FINDINGS Alignment: Facet joints are aligned without dislocation or traumatic listhesis. Dens and lateral masses are aligned. Straightening of the cervical lordosis. Skull base and vertebrae: No acute fracture. No primary bone lesion or focal pathologic process. Soft tissues and spinal canal: No prevertebral fluid or  swelling. No visible canal hematoma. 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 Minimal facet arthropathy bilaterally. Upper chest: Included lung apices are clear. Other: None. IMPRESSION: 1. No acute intracranial findings. 2. No acute fracture or traumatic listhesis of the cervical spine. 3. 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 Electronically Signed   By: Davina Poke D.O.   On: 11/24/2020 21:29   PERIPHERAL VASCULAR CATHETERIZATION  Result Date: 11/26/2020 See surgical note for result.  DG Chest Port 1 View  Result Date: 11/24/2020 CLINICAL DATA:  Weakness EXAM: PORTABLE CHEST 1 VIEW COMPARISON:  11/07/2020, 06/04/2019, 02/26/2020 FINDINGS: Cardiomegaly without edema, pleural effusion, focal opacity or pneumothorax. IMPRESSION: No active disease. Similar cardiomegaly since January 2022, increased compared to radiographs prior to this. Findings could be due to multi chamber enlargement or pericardial effusion. Electronically Signed   By: Donavan Foil M.D.   On: 11/24/2020 21:15   DG Finger Ring Right  Result Date: 11/24/2020 CLINICAL DATA:  Discoloration of ring finger EXAM: RIGHT RING FINGER 2+V COMPARISON:  None. FINDINGS: No fracture or malalignment. Vascular calcifications. Possible ulcer at the tip of the fourth digit. Possible small amount of air under the nail bed. IMPRESSION: No acute osseous abnormality. Possible soft tissue ulcer at the tip of the fourth digit Electronically Signed   By: Donavan Foil M.D.   On: 11/24/2020 21:16   ECHOCARDIOGRAM COMPLETE  Result Date: 11/25/2020    ECHOCARDIOGRAM REPORT   Patient Name:   [name]. Date of Exam: 11/25/2020 Medical Rec #:  [id number]            Height:       75.0 in Accession #:    [id number]           Weight:       205.0 lb Date of Birth:  1970-09-26             BSA:          2.218 m Patient Age:    31 years             BP:           130/76 mmHg Patient Gender: M                    HR:           71 bpm. Exam Location:  [name]  Procedure: 2D Echo, Color Doppler, Cardiac Doppler and Strain Analysis Indications:     I21.4 NSTEMI  History:         Patient has prior history of Echocardiogram examinations. CHF,  CKD; Risk Factors:Hypertension, Diabetes and Dyslipidemia.  Sonographer:     Charmayne Sheer Referring Phys:  [id number] Loletha Grayer Diagnosing Phys: Nelva Bush MD  Sonographer Comments: Suboptimal subcostal window. Global longitudinal strain was attempted. IMPRESSIONS  1. Left ventricular ejection fraction, by estimation, is 55 to 60%. The left ventricle has normal function. The left ventricle has no regional wall motion abnormalities. The left ventricular internal cavity size was borderline dilated. There is moderate  left ventricular hypertrophy. Left ventricular diastolic parameters are consistent with Grade II diastolic dysfunction (pseudonormalization). Elevated left atrial pressure. The average left ventricular global longitudinal strain is -13.0 %. The global longitudinal strain is abnormal.  2. Right ventricular systolic function is normal. The right ventricular size is mildly enlarged. Tricuspid regurgitation signal is inadequate for assessing PA pressure.  3. Left atrial size was mild to moderately dilated.  4. Right atrial size was mildly dilated.  5. There is a small to moderate pericardial effusion. There is no evidence of cardiac tamponade.  6. The mitral valve is normal in structure. Trivial mitral valve regurgitation. No evidence of mitral stenosis.  7. The aortic valve is tricuspid. Aortic valve regurgitation is not visualized. No aortic stenosis is present.  8. Aortic dilatation noted. There is borderline dilatation of the aortic root, measuring 38 mm.  9. Mildly dilated pulmonary artery. 10. The inferior vena cava is dilated in size with >50% respiratory variability, suggesting right atrial pressure of 8 mmHg. FINDINGS  Left Ventricle: Left ventricular ejection fraction, by estimation, is 55 to 60%.  The left ventricle has normal function. The left ventricle has no regional wall motion abnormalities. The average left ventricular global longitudinal strain is -13.0 %. The global longitudinal strain is abnormal. The left ventricular internal cavity size was borderline dilated. There is moderate left ventricular hypertrophy. Left ventricular diastolic parameters are consistent with Grade II diastolic dysfunction (pseudonormalization). Elevated left atrial pressure. Right Ventricle: The right ventricular size is mildly enlarged. No increase in right ventricular wall thickness. Right ventricular systolic function is normal. Tricuspid regurgitation signal is inadequate for assessing PA pressure. Left Atrium: Left atrial size was mild to moderately dilated. Right Atrium: Right atrial size was mildly dilated. Pericardium: There is a small to moderate pericardial effusion. There is no evidence of cardiac tamponade. Mitral Valve: The mitral valve is normal in structure. Trivial mitral valve regurgitation. No evidence of mitral valve stenosis. MV peak gradient, 4.8 mmHg. The mean mitral valve gradient is 2.0 mmHg. Tricuspid Valve: The tricuspid valve is normal in structure. Tricuspid valve regurgitation is mild. Aortic Valve: The aortic valve is tricuspid. Aortic valve regurgitation is not visualized. No aortic stenosis is present. Aortic valve mean gradient measures 5.0 mmHg. Aortic valve peak gradient measures 7.7 mmHg. Aortic valve area, by VTI measures 3.71 cm. Pulmonic Valve: The pulmonic valve was normal in structure. Pulmonic valve regurgitation is not visualized. No evidence of pulmonic stenosis. Aorta: Aortic dilatation noted. There is borderline dilatation of the aortic root, measuring 38 mm. Pulmonary Artery: The pulmonary artery is mildly dilated. Venous: The inferior vena cava is dilated in size with greater than 50% respiratory variability, suggesting right atrial pressure of 8 mmHg. IAS/Shunts: The  interatrial septum was not well visualized.  LEFT VENTRICLE PLAX 2D LVIDd:         5.63 cm  Diastology LVIDs:         3.56 cm  LV e' medial:    5.87 cm/s LV PW:         1.36  cm  LV E/e' medial:  18.9 LV IVS:        1.21 cm  LV e' lateral:   11.10 cm/s LVOT diam:     2.60 cm  LV E/e' lateral: 10.0 LV SV:         115 LV SV Index:   52       2D Longitudinal Strain LVOT Area:     5.31 cm 2D Strain GLS Avg:     -13.0 %  RIGHT VENTRICLE RV Basal diam:  4.56 cm TAPSE (M-mode): 2.2 cm LEFT ATRIUM              Index       RIGHT ATRIUM           Index LA diam:        4.80 cm  2.16 cm/m  RA Area:     21.10 cm LA Vol (A2C):   60.0 ml  27.06 ml/m RA Volume:   64.40 ml  29.04 ml/m LA Vol (A4C):   112.0 ml 50.51 ml/m LA Biplane Vol: 89.9 ml  40.54 ml/m  AORTIC VALVE                    PULMONIC VALVE AV Area (Vmax):    3.97 cm     PV Vmax:       1.16 m/s AV Area (Vmean):   3.76 cm     PV Vmean:      76.400 cm/s AV Area (VTI):     3.71 cm     PV VTI:        0.251 m AV Vmax:           139.00 cm/s  PV Peak grad:  5.4 mmHg AV Vmean:          106.000 cm/s PV Mean grad:  3.0 mmHg AV VTI:            0.309 m AV Peak Grad:      7.7 mmHg AV Mean Grad:      5.0 mmHg LVOT Vmax:         104.00 cm/s LVOT Vmean:        75.000 cm/s LVOT VTI:          0.216 m LVOT/AV VTI ratio: 0.70  AORTA Ao Root diam: 3.80 cm MITRAL VALVE MV Area (PHT): 4.26 cm     SHUNTS MV Area VTI:   3.98 cm     Systemic VTI:  0.22 m MV Peak grad:  4.8 mmHg     Systemic Diam: 2.60 cm MV Mean grad:  2.0 mmHg MV Vmax:       1.09 m/s MV Vmean:      67.2 cm/s MV Decel Time: 178 msec MV E velocity: 111.00 cm/s MV A velocity: 53.60 cm/s MV E/A ratio:  2.07 Christopher End MD Electronically signed by Nelva Bush MD Signature Date/Time: 11/25/2020/12:02:59 PM    Final         Scheduled Meds:  amiodarone  200 mg Oral Daily   aspirin EC  81 mg Oral Daily   atorvastatin  80 mg Oral QHS   calcium acetate  2,001 mg Oral TID WC   carvedilol  12.5 mg Oral BID WC    Chlorhexidine Gluconate Cloth  6 each Topical Q0600   Chlorhexidine Gluconate Cloth  6 each Topical Once   And   Chlorhexidine Gluconate Cloth  6 each Topical Once   clopidogrel  75 mg Oral  Daily   dorzolamide-timolol  1 drop Right Eye BID   insulin aspart  0-5 Units Subcutaneous QHS   insulin aspart  0-6 Units Subcutaneous TID WC   Continuous Infusions:  [START ON 11/27/2020]  ceFAZolin (ANCEF) IV     cefTRIAXone (ROCEPHIN)  IV Stopped (11/26/20 0507)   heparin 1,600 Units/hr (11/26/20 1342)     LOS: 1 day    Time spent: 27 minutes    Sharen Hones, MD [facility]   To contact the attending provider between 7A-7P or the covering provider during after hours 7P-7A, please log into the web site [email or web address] and access using universal [place] password for that web site. If you do not have the password, please call the hospital operator.  11/26/2020, 4:41 PM

## 2020-11-26 NOTE — Op Note (Signed)
OPERATIVE REPORT     PREOPERATIVE DIAGNOSIS: 1. End-stage renal disease. 2. Steal syndrome, right arm with patent right brachiocephalic AV fistula and gangrene of the right fourth finger.   POSTOPERATIVE DIAGNOSIS: Same as above   PROCEDURE PERFORMED: 1. Ultrasound guidance vascular access to right femoral artery. 2. Catheter placement to right radial artery and right ulnar arteries     from right femoral approach. 3. Thoracic aortogram and selective right upper extremity angiogram     including selective images of the radial and ulnar arteries. 4.  StarClose closure device right femoral artery.   SURGEON:  Algernon Huxley, MD   ANESTHESIA:  Local with moderate conscious sedation for 33 minutes using 2 mg of Versed and 50 mcg of Fentanyl   BLOOD LOSS:  Minimal.   FLUOROSCOPY TIME: 7.1 minutes   INDICATION FOR PROCEDURE:  This is a 50 y.o.male who presented to our office with steal syndrome.  The patient's right brachiocephalic AV fistula is working well, but their hand is numb and painful.  He also has gangrenous changes to the right fourth finger.  To further evaluate this to determine what options would be possible to treat the steal syndrome, angiogram of the left upper extremity is indicated.  Risks and benefits are discussed.  Informed consent was obtained.   DESCRIPTION OF PROCEDURE:  The patient was brought to the vascular suite.  Moderate conscious sedation was administered during a face to face encounter with the patient throughout the procedure with my supervision of the RN administering medicines and monitoring the patient's vital signs, pulse oximetry, telemetry and mental status throughout from the start of the procedure until the patient was taken to the recovery room.  Groins were shaved and prepped and sterile surgical field was created.  The right femoral head was localized with fluoroscopy and the right femoral artery was then visualized with ultrasound and found to  be widely patent.  It was then accessed under direct ultrasound guidance without difficulty with a Seldinger needle and a permanent image was recorded.  A J-wire and 5-French sheath were then placed.  Pigtail catheter was placed into the ascending aorta and a thoracic aortogram was then performed in the LAO projection. This demonstrated normal origins to the great vessels without significant proximal stenoses and a normal configuration of the great vessels.  The patient was given 3000 units of intravenous heparin and a headhunter catheter was used to selectively cannulate the innominate artery and then the right  subclavian artery without difficulty.  This was then sequentially advanced to the brachial artery and to the brachial bifurcation after exchanging for a Nava cross catheter.  Steal was demonstrated with all of the flow from the artery going into the fistula and not downstream to the hand on images with the catheter proximal to the access. I then advanced a catheter beyond the access and into the radial and ulnar arteries.  The radial artery was entered first, and selective imaging was performed to evaluate more distally in the radial artery.  The radial artery was small but without focal stenosis and did fill the hand into the palmar arch although essentially no flow was seen in any of the digits and still retrograde flow was seen because of the steal with the AV fistula.  After this, the catheter was removed and exchanged for a seeker catheter and placed into the ulnar artery, this was evaluated.  The ulnar artery occluded at the wrist and did not have any filling into  the hand.  There is really not a distal target that was visible in the 150 cm catheter would not get past the occlusion to try to get across this and treat this percutaneously.  The Ferd Hibbs cross catheter was also placed into the interosseous artery which was small but patent but did not provide much blood flow to the hand.  Findings  in the right upper extremity showed severe steal and no good endoluminal options for revascularization.  Fistula banding and possible ligation will likely be necessary for limb salvage.   The diagnostic catheter was removed.  Oblique arteriogram was performed of the right femoral artery and StarClose closure device deployed in the usual fashion with excellent hemostatic result. The patient tolerated the procedure well and was taken to the recovery room in stable condition.    [name] 11/26/2020 9:10 AM

## 2020-11-26 NOTE — Progress Notes (Signed)
[facility]  ROUNDING NOTE   Subjective:   [name]. was admitted to [facility] on 11/24/2020 for Infection [B99.9] ESRD (end stage renal disease) ([place]) [N18.6] NSTEMI (non-ST elevated myocardial infarction) ([place]) [I21.4] Cellulitis of finger of right hand [id number] Atrial fibrillation with RVR ([place]) [I48.91] Left leg cellulitis [id number] Fall, initial encounter [W19.XXXA] Gangrene of finger ([place]) [I96] Syncope, unspecified syncope type [R55] Postural dizziness with presyncope [R42, R55]  Last hemodialysis treatment was yesterday. Completed 3 and 1/2 hours of his treatment. Left at 88.5kg. he was under his dry weight on initiation of dialysis yesterday.   Patient seen laying in bed after procedure Alert and oriented Denies nausea, vomiting and diarrhea Denies shortness of breath   Objective:  Vital signs in last 24 hours:  Temp:  [97.8 F (36.6 C)-98.7 F (37.1 C)] 97.8 F (36.6 C) (10/05 1057) Pulse Rate:  [63-76] 66 (10/05 1057) Resp:  [0-18] 16 (10/05 1057) BP: (108-158)/(54-79) 146/65 (10/05 1057) SpO2:  [94 %-100 %] 94 % (10/05 1057) Weight:  [90.7 kg] 90.7 kg (10/05 0755)  Weight change:  Filed Weights   11/24/20 2329 11/26/20 0755  Weight: 93 kg 90.7 kg    Intake/Output: I/O last 3 completed shifts: In: 734.6 [P.O.:240; I.V.:397.5; IV Piggyback:97.1] Out: -    Intake/Output this shift:  No intake/output data recorded.  Physical Exam: General: NAD, laying on stretcher  Head: Normocephalic, atraumatic. Moist oral mucosal membranes  Eyes: Scleral opacities.   Lungs:  Clear to auscultation  Heart: Regular rate and rhythm  Abdomen:  Soft, nontender  Extremities:  no peripheral edema.  Neurologic: Nonfocal, moving all four extremities  Skin: Left thigh lesion  Access: Right AVF    Basic Metabolic Panel: Recent Labs  Lab 11/24/20 1652 11/25/20 0523 11/26/20 0257  NA 142 141 141  K 3.7 4.0 4.5  CL 113* 112* 113*  CO2 18* 17*  17*  GLUCOSE 145* 88 74  BUN 52* 58* 69*  CREATININE 12.64* 13.89* 14.92*  CALCIUM 7.9* 7.7* 7.6*  MG 2.0  --   --      Liver Function Tests: No results for input(s): AST, ALT, ALKPHOS, BILITOT, PROT, ALBUMIN in the last 168 hours. No results for input(s): LIPASE, AMYLASE in the last 168 hours. No results for input(s): AMMONIA in the last 168 hours.  CBC: Recent Labs  Lab 11/24/20 1652 11/25/20 0523 11/26/20 0257  WBC 8.5 9.1 10.0  NEUTROABS 7.2  --   --   HGB 9.2* 8.4* 8.0*  HCT 29.3* 26.3* 26.3*  MCV 75.3* 73.9* 76.2*  PLT 243 222 197     Cardiac Enzymes: No results for input(s): CKTOTAL, CKMB, CKMBINDEX, TROPONINI in the last 168 hours.  BNP: Invalid input(s): POCBNP  CBG: Recent Labs  Lab 11/25/20 0746 11/25/20 1147 11/25/20 1636 11/25/20 2121 11/26/20 0758  GLUCAP 78 96 115* 97 79     Microbiology: Results for orders placed or performed during the hospital encounter of 11/24/20  Blood culture (routine single)     Status: None (Preliminary result)   Collection Time: 11/24/20  8:36 PM   Specimen: BLOOD  Result Value Ref Range Status   Specimen Description BLOOD BLOOD LEFT FOREARM  Final   Special Requests   Final    BOTTLES DRAWN AEROBIC AND ANAEROBIC Blood Culture adequate volume   Culture   Final    NO GROWTH 2 DAYS Performed at [facility], [place], [address], [place],  [address]    Report Status  PENDING  Incomplete  Resp Panel by RT-PCR (Flu A&B, Covid) Nasopharyngeal Swab     Status: None   Collection Time: 11/24/20  8:36 PM   Specimen: Nasopharyngeal Swab; Nasopharyngeal(NP) swabs in vial transport medium  Result Value Ref Range Status   SARS Coronavirus 2 by RT PCR NEGATIVE NEGATIVE Final    Comment: (NOTE) SARS-CoV-2 target nucleic acids are NOT DETECTED.  The SARS-CoV-2 RNA is generally detectable in upper respiratory specimens during the acute phase of infection. The lowest concentration of SARS-CoV-2 viral copies this  assay can detect is 138 copies/mL. A negative result does not preclude SARS-Cov-2 infection and should not be used as the sole basis for treatment or other patient management decisions. A negative result may occur with  improper specimen collection/handling, submission of specimen other than nasopharyngeal swab, presence of viral mutation(s) within the areas targeted by this assay, and inadequate number of viral copies(<138 copies/mL). A negative result must be combined with clinical observations, patient history, and epidemiological information. The expected result is Negative.  Fact Sheet for Patients:  EntrepreneurPulse.com.au  Fact Sheet for Healthcare Providers:  IncredibleEmployment.be  This test is no t yet approved or cleared by the [place] FDA and  has been authorized for detection and/or diagnosis of SARS-CoV-2 by FDA under an Emergency Use Authorization (EUA). This EUA will remain  in effect (meaning this test can be used) for the duration of the COVID-19 declaration under Section 564(b)(1) of the Act, 21 U.S.C.section 360bbb-3(b)(1), unless the authorization is terminated  or revoked sooner.       Influenza A by PCR NEGATIVE NEGATIVE Final   Influenza B by PCR NEGATIVE NEGATIVE Final    Comment: (NOTE) The Xpert Xpress SARS-CoV-2/FLU/RSV plus assay is intended as an aid in the diagnosis of influenza from Nasopharyngeal swab specimens and should not be used as a sole basis for treatment. Nasal washings and aspirates are unacceptable for Xpert Xpress SARS-CoV-2/FLU/RSV testing.  Fact Sheet for Patients: EntrepreneurPulse.com.au  Fact Sheet for Healthcare Providers: IncredibleEmployment.be  This test is not yet approved or cleared by the [place] FDA and has been authorized for detection and/or diagnosis of SARS-CoV-2 by FDA under an Emergency Use Authorization (EUA). This EUA will  remain in effect (meaning this test can be used) for the duration of the COVID-19 declaration under Section 564(b)(1) of the Act, 21 U.S.C. section 360bbb-3(b)(1), unless the authorization is terminated or revoked.  Performed at [facility], [address] [place],  [address]     Coagulation Studies: Recent Labs    11/24/20 2034/05/24  LABPROT 15.6*  INR 1.2     Urinalysis: No results for input(s): COLORURINE, LABSPEC, PHURINE, GLUCOSEU, HGBUR, BILIRUBINUR, KETONESUR, PROTEINUR, UROBILINOGEN, NITRITE, LEUKOCYTESUR in the last 72 hours.  Invalid input(s): APPERANCEUR    Imaging: CT HEAD WO CONTRAST (5MM)  Result Date: 11/24/2020 CLINICAL DATA:  Mental status change, unknown cause; Neck trauma, impaired ROM (Age 54-64y) EXAM: CT HEAD WITHOUT CONTRAST CT CERVICAL SPINE WITHOUT CONTRAST TECHNIQUE: Multidetector CT imaging of the head and cervical spine was performed following the standard protocol without intravenous contrast. Multiplanar CT image reconstructions of the cervical spine were also generated. COMPARISON:  07/18/2017 FINDINGS: CT HEAD FINDINGS Brain: No evidence of acute infarction, hemorrhage, hydrocephalus, extra-axial collection or mass lesion/mass effect. Vascular: Atherosclerotic calcifications involving the large vessels of the skull base. No unexpected hyperdense vessel. Skull: Normal. Negative for fracture or focal lesion. Sinuses/Orbits: No acute finding. Other: None. CT CERVICAL SPINE FINDINGS Alignment: Facet joints  are aligned without dislocation or traumatic listhesis. Dens and lateral masses are aligned. Straightening of the cervical lordosis. Skull base and vertebrae: No acute fracture. No primary bone lesion or focal pathologic process. Soft tissues and spinal canal: No prevertebral fluid or swelling. No visible canal hematoma. 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 Minimal facet arthropathy bilaterally. Upper chest: Included lung apices are  clear. Other: None. IMPRESSION: 1. No acute intracranial findings. 2. No acute fracture or traumatic listhesis of the cervical spine. 3. 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 Electronically Signed   By: Davina Poke D.O.   On: 11/24/2020 21:29   CT Cervical Spine Wo Contrast  Result Date: 11/24/2020 CLINICAL DATA:  Mental status change, unknown cause; Neck trauma, impaired ROM (Age 24-64y) EXAM: CT HEAD WITHOUT CONTRAST CT CERVICAL SPINE WITHOUT CONTRAST TECHNIQUE: Multidetector CT imaging of the head and cervical spine was performed following the standard protocol without intravenous contrast. Multiplanar CT image reconstructions of the cervical spine were also generated. COMPARISON:  07/18/2017 FINDINGS: CT HEAD FINDINGS Brain: No evidence of acute infarction, hemorrhage, hydrocephalus, extra-axial collection or mass lesion/mass effect. Vascular: Atherosclerotic calcifications involving the large vessels of the skull base. No unexpected hyperdense vessel. Skull: Normal. Negative for fracture or focal lesion. Sinuses/Orbits: No acute finding. Other: None. CT CERVICAL SPINE FINDINGS Alignment: Facet joints are aligned without dislocation or traumatic listhesis. Dens and lateral masses are aligned. Straightening of the cervical lordosis. Skull base and vertebrae: No acute fracture. No primary bone lesion or focal pathologic process. Soft tissues and spinal canal: No prevertebral fluid or swelling. No visible canal hematoma. 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 Minimal facet arthropathy bilaterally. Upper chest: Included lung apices are clear. Other: None. IMPRESSION: 1. No acute intracranial findings. 2. No acute fracture or traumatic listhesis of the cervical spine. 3. 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 Electronically Signed   By: Davina Poke D.O.   On: 11/24/2020 21:29   PERIPHERAL VASCULAR CATHETERIZATION  Result Date: 11/26/2020 See surgical note for result.  DG Chest Port 1  View  Result Date: 11/24/2020 CLINICAL DATA:  Weakness EXAM: PORTABLE CHEST 1 VIEW COMPARISON:  11/07/2020, 06/04/2019, 02/26/2020 FINDINGS: Cardiomegaly without edema, pleural effusion, focal opacity or pneumothorax. IMPRESSION: No active disease. Similar cardiomegaly since January 2022, increased compared to radiographs prior to this. Findings could be due to multi chamber enlargement or pericardial effusion. Electronically Signed   By: Donavan Foil M.D.   On: 11/24/2020 21:15   DG Finger Ring Right  Result Date: 11/24/2020 CLINICAL DATA:  Discoloration of ring finger EXAM: RIGHT RING FINGER 2+V COMPARISON:  None. FINDINGS: No fracture or malalignment. Vascular calcifications. Possible ulcer at the tip of the fourth digit. Possible small amount of air under the nail bed. IMPRESSION: No acute osseous abnormality. Possible soft tissue ulcer at the tip of the fourth digit Electronically Signed   By: Donavan Foil M.D.   On: 11/24/2020 21:16   ECHOCARDIOGRAM COMPLETE  Result Date: 11/25/2020    ECHOCARDIOGRAM REPORT   Patient Name:   [name]. Date of Exam: 11/25/2020 Medical Rec #:  [id number]            Height:       75.0 in Accession #:    [id number]           Weight:       205.0 lb Date of Birth:  1970/06/28             BSA:  2.218 m Patient Age:    50 years             BP:           130/76 mmHg Patient Gender: M                    HR:           71 bpm. Exam Location:  [name] Procedure: 2D Echo, Color Doppler, Cardiac Doppler and Strain Analysis Indications:     I21.4 NSTEMI  History:         Patient has prior history of Echocardiogram examinations. CHF,                  CKD; Risk Factors:Hypertension, Diabetes and Dyslipidemia.  Sonographer:     Charmayne Sheer Referring Phys:  [id number] Loletha Grayer Diagnosing Phys: Nelva Bush MD  Sonographer Comments: Suboptimal subcostal window. Global longitudinal strain was attempted. IMPRESSIONS  1. Left ventricular ejection fraction, by estimation,  is 55 to 60%. The left ventricle has normal function. The left ventricle has no regional wall motion abnormalities. The left ventricular internal cavity size was borderline dilated. There is moderate  left ventricular hypertrophy. Left ventricular diastolic parameters are consistent with Grade II diastolic dysfunction (pseudonormalization). Elevated left atrial pressure. The average left ventricular global longitudinal strain is -13.0 %. The global longitudinal strain is abnormal.  2. Right ventricular systolic function is normal. The right ventricular size is mildly enlarged. Tricuspid regurgitation signal is inadequate for assessing PA pressure.  3. Left atrial size was mild to moderately dilated.  4. Right atrial size was mildly dilated.  5. There is a small to moderate pericardial effusion. There is no evidence of cardiac tamponade.  6. The mitral valve is normal in structure. Trivial mitral valve regurgitation. No evidence of mitral stenosis.  7. The aortic valve is tricuspid. Aortic valve regurgitation is not visualized. No aortic stenosis is present.  8. Aortic dilatation noted. There is borderline dilatation of the aortic root, measuring 38 mm.  9. Mildly dilated pulmonary artery. 10. The inferior vena cava is dilated in size with >50% respiratory variability, suggesting right atrial pressure of 8 mmHg. FINDINGS  Left Ventricle: Left ventricular ejection fraction, by estimation, is 55 to 60%. The left ventricle has normal function. The left ventricle has no regional wall motion abnormalities. The average left ventricular global longitudinal strain is -13.0 %. The global longitudinal strain is abnormal. The left ventricular internal cavity size was borderline dilated. There is moderate left ventricular hypertrophy. Left ventricular diastolic parameters are consistent with Grade II diastolic dysfunction (pseudonormalization). Elevated left atrial pressure. Right Ventricle: The right ventricular size is mildly  enlarged. No increase in right ventricular wall thickness. Right ventricular systolic function is normal. Tricuspid regurgitation signal is inadequate for assessing PA pressure. Left Atrium: Left atrial size was mild to moderately dilated. Right Atrium: Right atrial size was mildly dilated. Pericardium: There is a small to moderate pericardial effusion. There is no evidence of cardiac tamponade. Mitral Valve: The mitral valve is normal in structure. Trivial mitral valve regurgitation. No evidence of mitral valve stenosis. MV peak gradient, 4.8 mmHg. The mean mitral valve gradient is 2.0 mmHg. Tricuspid Valve: The tricuspid valve is normal in structure. Tricuspid valve regurgitation is mild. Aortic Valve: The aortic valve is tricuspid. Aortic valve regurgitation is not visualized. No aortic stenosis is present. Aortic valve mean gradient measures 5.0 mmHg. Aortic valve peak gradient measures 7.7 mmHg. Aortic valve area, by VTI  measures 3.71 cm. Pulmonic Valve: The pulmonic valve was normal in structure. Pulmonic valve regurgitation is not visualized. No evidence of pulmonic stenosis. Aorta: Aortic dilatation noted. There is borderline dilatation of the aortic root, measuring 38 mm. Pulmonary Artery: The pulmonary artery is mildly dilated. Venous: The inferior vena cava is dilated in size with greater than 50% respiratory variability, suggesting right atrial pressure of 8 mmHg. IAS/Shunts: The interatrial septum was not well visualized.  LEFT VENTRICLE PLAX 2D LVIDd:         5.63 cm  Diastology LVIDs:         3.56 cm  LV e' medial:    5.87 cm/s LV PW:         1.36 cm  LV E/e' medial:  18.9 LV IVS:        1.21 cm  LV e' lateral:   11.10 cm/s LVOT diam:     2.60 cm  LV E/e' lateral: 10.0 LV SV:         115 LV SV Index:   52       2D Longitudinal Strain LVOT Area:     5.31 cm 2D Strain GLS Avg:     -13.0 %  RIGHT VENTRICLE RV Basal diam:  4.56 cm TAPSE (M-mode): 2.2 cm LEFT ATRIUM              Index       RIGHT ATRIUM            Index LA diam:        4.80 cm  2.16 cm/m  RA Area:     21.10 cm LA Vol (A2C):   60.0 ml  27.06 ml/m RA Volume:   64.40 ml  29.04 ml/m LA Vol (A4C):   112.0 ml 50.51 ml/m LA Biplane Vol: 89.9 ml  40.54 ml/m  AORTIC VALVE                    PULMONIC VALVE AV Area (Vmax):    3.97 cm     PV Vmax:       1.16 m/s AV Area (Vmean):   3.76 cm     PV Vmean:      76.400 cm/s AV Area (VTI):     3.71 cm     PV VTI:        0.251 m AV Vmax:           139.00 cm/s  PV Peak grad:  5.4 mmHg AV Vmean:          106.000 cm/s PV Mean grad:  3.0 mmHg AV VTI:            0.309 m AV Peak Grad:      7.7 mmHg AV Mean Grad:      5.0 mmHg LVOT Vmax:         104.00 cm/s LVOT Vmean:        75.000 cm/s LVOT VTI:          0.216 m LVOT/AV VTI ratio: 0.70  AORTA Ao Root diam: 3.80 cm MITRAL VALVE MV Area (PHT): 4.26 cm     SHUNTS MV Area VTI:   3.98 cm     Systemic VTI:  0.22 m MV Peak grad:  4.8 mmHg     Systemic Diam: 2.60 cm MV Mean grad:  2.0 mmHg MV Vmax:       1.09 m/s MV Vmean:      67.2 cm/s MV Decel Time: 178 msec  MV E velocity: 111.00 cm/s MV A velocity: 53.60 cm/s MV E/A ratio:  2.07 Christopher End MD Electronically signed by Nelva Bush MD Signature Date/Time: 11/25/2020/12:02:59 PM    Final      Medications:    cefTRIAXone (ROCEPHIN)  IV 1 g (11/26/20 0437)   heparin      amiodarone  200 mg Oral Daily   aspirin EC  81 mg Oral Daily   atorvastatin  80 mg Oral QHS   calcium acetate  2,001 mg Oral TID WC   carvedilol  12.5 mg Oral BID WC   Chlorhexidine Gluconate Cloth  6 each Topical Q0600   clopidogrel  75 mg Oral Daily   dorzolamide-timolol  1 drop Right Eye BID   insulin aspart  0-5 Units Subcutaneous QHS   insulin aspart  0-6 Units Subcutaneous TID WC   acetaminophen, HYDROmorphone (DILAUDID) injection, nitroGLYCERIN, ondansetron (ZOFRAN) IV, ondansetron (ZOFRAN) IV  Assessment/ Plan:  [name]. is a 50 y.o. black male with end stage renal disease on hemodialysis, hypertension,  diabetes mellitus type II, diabetic retinopathy, diabetic neuropathy, diabetic gastroparesis, congestive heart failure, hyperlipidemia, BPH who is admitted to [facility] on 11/24/2020 for Infection [B99.9] ESRD (end stage renal disease) ([place]) [N18.6] NSTEMI (non-ST elevated myocardial infarction) ([place]) [I21.4] Cellulitis of finger of right hand [id number] Atrial fibrillation with RVR ([place]) [I48.91] Left leg cellulitis [L03.116] Fall, initial encounter [W19.XXXA] Gangrene of finger ([place]) [I96] Syncope, unspecified syncope type [R55] Postural dizziness with presyncope [R42, R55]  [facility] Left AVF 93kg  End stage renal disease: hemodialysis treatment yesterday as outpatient. Left well below his dry weight at 89kg. Does not appear volume overloaded. Potassium at goal.  - Scheduled to receive dialysis today  Hypertension with ESRD: 137/74. Patient follows with [facility], Dr. Tobin Chad, as outpatient. With irregular rhythm on admission. Now back in sinus. History of difficult to control. Current regimen of carvedilol and tamsulosin.  - ECHO shows EF 55-60% with level 2 diastolic dysfunction - Appreciate cardiology input.    Anemia with chronic kidney disease: hemoglobin 8.4. Microcytic. Iron studies were at goal on 09/01/20.  - iron studies within acceptable limits -Low dose EPO with dialysis treatment.   Secondary Hyperparathyroidism: with hyperphosphatemia.   - Calcium below target, will check phos in am - calcium acetate with meals.   Diabetes mellitus type II with chronic kidney disease: insulin dependent. Hemoglobin A1c of 5.3% on 08/2020.  Left thigh lesion: diagnosed as cellulitis. Examination is not consistent with calciphylaxis. No induration, nontender on examination. On amoxicillin as outpatient.   Complication of dialysis device: gangrene of third right digit.   - believed to be Steal syndrome, further intervention planned by Vascular tomorrow - Appreciate  vascular evaluation and treatment.    LOS: 1   10/5/202212:01 PM

## 2020-11-26 NOTE — Progress Notes (Addendum)
[facility] Daily Progress Note  11/26/20: 1. Ultrasound guidance vascular access to right femoral artery. 2. Catheter placement to right radial artery and right ulnar arteries     from right femoral approach. 3. Thoracic aortogram and selective right upper extremity angiogram     including selective images of the radial and ulnar arteries. 4.  StarClose closure device right femoral artery.  Subjective: Patient without complaint.  Status post right upper extremity angiogram this AM.  Objective: Vitals:   11/26/20 0919 11/26/20 0940 11/26/20 0945 11/26/20 1057  BP: (!) 148/72 130/71 140/68 (!) 146/65  Pulse: 67 64 66 66  Resp: 10 12 (!) 0 16  Temp:    97.8 F (36.6 C)  TempSrc:    Oral  SpO2:   99% 94%  Weight:      Height:        Intake/Output Summary (Last 24 hours) at 11/26/2020 1214 Last data filed at 11/26/2020 0000 Gross per 24 hour  Intake 637.52 ml  Output --  Net 637.52 ml   Physical Exam: A&Ox3, NAD CV: RRR Pulmonary: CTA Bilaterally Abdomen: Soft, Nontender, Nondistended Vascular:  Right upper extremity: Stable gangrenous changes to the fourth finger.  Hand is warm.  Dialysis access: Good bruit and thrill.   Laboratory: CBC    Component Value Date/Time   WBC 10.0 11/26/2020 0257   HGB 8.0 (L) 11/26/2020 0257   HCT 26.3 (L) 11/26/2020 0257   PLT 197 11/26/2020 0257   BMET    Component Value Date/Time   NA 141 11/26/2020 0257   K 4.5 11/26/2020 0257   CL 113 (H) 11/26/2020 0257   CO2 17 (L) 11/26/2020 0257   GLUCOSE 74 11/26/2020 0257   BUN 69 (H) 11/26/2020 0257   CREATININE 14.92 (H) 11/26/2020 0257   CALCIUM 7.6 (L) 11/26/2020 0257   GFRNONAA 4 (L) 11/26/2020 0257   GFRAA 5 (L) 08/27/2019 1540   Assessment/Planning: The patient is a 50 year old male with known history of end-stage renal disease who presents with gangrenous changes to the fourth finger of the right hand.  1) patient was seen and examined status post  right upper extremity angiogram which was notable for steal.  I had a long conversation with the patient about the procedure findings. 2) we discussed banding his graft in an attempt to provide more blood flow to the hand and still be able to dialyze via his access.  Right upper extremity dialysis access banding procedure, risks and benefits were explained to the patient.  All questions were answered.  The patient wishes to proceed.  We will plan on this tomorrow. 3) we will need to stop the heparin 6 hours before surgery.  Order placed.  Discussed with Dr. Ellis Parents Romano Stigger PA-C 11/26/2020 12:14 PM

## 2020-11-26 NOTE — Progress Notes (Signed)
Progress Note  Patient Name: [name]. Date of Encounter: 11/26/2020  [facility] Cardiologist: Larae Grooms, MD   Subjective   Denies chest pains, shortness of breath, dizziness.  Just came back from right upper extremity angiogram.  Denies palpitations.  Inpatient Medications    Scheduled Meds:  amiodarone  200 mg Oral Daily   aspirin EC  81 mg Oral Daily   atorvastatin  80 mg Oral QHS   calcium acetate  2,001 mg Oral TID WC   carvedilol  12.5 mg Oral BID WC   Chlorhexidine Gluconate Cloth  6 each Topical Q0600   clopidogrel  75 mg Oral Daily   dorzolamide-timolol  1 drop Right Eye BID   insulin aspart  0-5 Units Subcutaneous QHS   insulin aspart  0-6 Units Subcutaneous TID WC   Continuous Infusions:  cefTRIAXone (ROCEPHIN)  IV 1 g (11/26/20 0437)   heparin     PRN Meds: acetaminophen, HYDROmorphone (DILAUDID) injection, nitroGLYCERIN, ondansetron (ZOFRAN) IV, ondansetron (ZOFRAN) IV   Vital Signs    Vitals:   11/26/20 0919 11/26/20 0940 11/26/20 0945 11/26/20 1057  BP: (!) 148/72 130/71 140/68 (!) 146/65  Pulse: 67 64 66 66  Resp: 10 12 (!) 0 16  Temp:    97.8 F (36.6 C)  TempSrc:    Oral  SpO2:   99% 94%  Weight:      Height:        Intake/Output Summary (Last 24 hours) at 11/26/2020 1146 Last data filed at 11/26/2020 0000 Gross per 24 hour  Intake 637.52 ml  Output --  Net 637.52 ml   Last 3 Weights 11/26/2020 11/24/2020 11/07/2020  Weight (lbs) 200 lb 205 lb 200 lb  Weight (kg) 90.719 kg 92.987 kg 90.719 kg  Some encounter information is confidential and restricted. Go to Review Flowsheets activity to see all data.      Telemetry    Sinus rhythm- Personally Reviewed  ECG    No new tracing today- Personally Reviewed  Physical Exam   GEN: No acute distress.   Neck: No JVD Cardiac: RRR, no murmurs, rubs, or gallops.  Respiratory: Clear to auscultation bilaterally. GI: Soft, nontender, non-distended  MS: No edema; right  fourth finger/digit gangrene noted. Neuro:  Nonfocal  Psych: Normal affect   Labs    High Sensitivity Troponin:   Recent Labs  Lab 11/24/20 1652 11/24/20 2210 11/25/20 0749 11/25/20 1547 11/26/20 1044  TROPONINIHS 104* 731* 1,556* 1,422* 913*     Chemistry Recent Labs  Lab 11/24/20 1652 11/25/20 0523 11/26/20 0257  NA 142 141 141  K 3.7 4.0 4.5  CL 113* 112* 113*  CO2 18* 17* 17*  GLUCOSE 145* 88 74  BUN 52* 58* 69*  CREATININE 12.64* 13.89* 14.92*  CALCIUM 7.9* 7.7* 7.6*  MG 2.0  --   --   GFRNONAA 4* 4* 4*  ANIONGAP '11 12 11    '$ Lipids  Recent Labs  Lab 11/26/20 0257  CHOL 122  TRIG 78  HDL 45  LDLCALC 61  CHOLHDL 2.7    Hematology Recent Labs  Lab 11/24/20 1652 11/25/20 0523 11/26/20 0257  WBC 8.5 9.1 10.0  RBC 3.89* 3.56* 3.45*  HGB 9.2* 8.4* 8.0*  HCT 29.3* 26.3* 26.3*  MCV 75.3* 73.9* 76.2*  MCH 23.7* 23.6* 23.2*  MCHC 31.4 31.9 30.4  RDW 19.8* 19.7* 19.9*  PLT 243 222 197   Thyroid  Recent Labs  Lab 11/25/20 1547  TSH 3.908  BNPNo results for input(s): BNP, PROBNP in the last 168 hours.  DDimer No results for input(s): DDIMER in the last 168 hours.   Radiology    CT HEAD WO CONTRAST (5MM)  Result Date: 11/24/2020 CLINICAL DATA:  Mental status change, unknown cause; Neck trauma, impaired ROM (Age 25-64y) EXAM: CT HEAD WITHOUT CONTRAST CT CERVICAL SPINE WITHOUT CONTRAST TECHNIQUE: Multidetector CT imaging of the head and cervical spine was performed following the standard protocol without intravenous contrast. Multiplanar CT image reconstructions of the cervical spine were also generated. COMPARISON:  07/18/2017 FINDINGS: CT HEAD FINDINGS Brain: No evidence of acute infarction, hemorrhage, hydrocephalus, extra-axial collection or mass lesion/mass effect. Vascular: Atherosclerotic calcifications involving the large vessels of the skull base. No unexpected hyperdense vessel. Skull: Normal. Negative for fracture or focal lesion.  Sinuses/Orbits: No acute finding. Other: None. CT CERVICAL SPINE FINDINGS Alignment: Facet joints are aligned without dislocation or traumatic listhesis. Dens and lateral masses are aligned. Straightening of the cervical lordosis. Skull base and vertebrae: No acute fracture. No primary bone lesion or focal pathologic process. Soft tissues and spinal canal: No prevertebral fluid or swelling. No visible canal hematoma. 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 Minimal facet arthropathy bilaterally. Upper chest: Included lung apices are clear. Other: None. IMPRESSION: 1. No acute intracranial findings. 2. No acute fracture or traumatic listhesis of the cervical spine. 3. 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 Electronically Signed   By: Davina Poke D.O.   On: 11/24/2020 21:29   CT Cervical Spine Wo Contrast  Result Date: 11/24/2020 CLINICAL DATA:  Mental status change, unknown cause; Neck trauma, impaired ROM (Age 68-64y) EXAM: CT HEAD WITHOUT CONTRAST CT CERVICAL SPINE WITHOUT CONTRAST TECHNIQUE: Multidetector CT imaging of the head and cervical spine was performed following the standard protocol without intravenous contrast. Multiplanar CT image reconstructions of the cervical spine were also generated. COMPARISON:  07/18/2017 FINDINGS: CT HEAD FINDINGS Brain: No evidence of acute infarction, hemorrhage, hydrocephalus, extra-axial collection or mass lesion/mass effect. Vascular: Atherosclerotic calcifications involving the large vessels of the skull base. No unexpected hyperdense vessel. Skull: Normal. Negative for fracture or focal lesion. Sinuses/Orbits: No acute finding. Other: None. CT CERVICAL SPINE FINDINGS Alignment: Facet joints are aligned without dislocation or traumatic listhesis. Dens and lateral masses are aligned. Straightening of the cervical lordosis. Skull base and vertebrae: No acute fracture. No primary bone lesion or focal pathologic process. Soft tissues and spinal canal: No  prevertebral fluid or swelling. No visible canal hematoma. 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 Minimal facet arthropathy bilaterally. Upper chest: Included lung apices are clear. Other: None. IMPRESSION: 1. No acute intracranial findings. 2. No acute fracture or traumatic listhesis of the cervical spine. 3. 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 Electronically Signed   By: Davina Poke D.O.   On: 11/24/2020 21:29   PERIPHERAL VASCULAR CATHETERIZATION  Result Date: 11/26/2020 See surgical note for result.  DG Chest Port 1 View  Result Date: 11/24/2020 CLINICAL DATA:  Weakness EXAM: PORTABLE CHEST 1 VIEW COMPARISON:  11/07/2020, 06/04/2019, 02/26/2020 FINDINGS: Cardiomegaly without edema, pleural effusion, focal opacity or pneumothorax. IMPRESSION: No active disease. Similar cardiomegaly since January 2022, increased compared to radiographs prior to this. Findings could be due to multi chamber enlargement or pericardial effusion. Electronically Signed   By: Donavan Foil M.D.   On: 11/24/2020 21:15   DG Finger Ring Right  Result Date: 11/24/2020 CLINICAL DATA:  Discoloration of ring finger EXAM: RIGHT RING FINGER 2+V COMPARISON:  None. FINDINGS: No fracture or malalignment.  Vascular calcifications. Possible ulcer at the tip of the fourth digit. Possible small amount of air under the nail bed. IMPRESSION: No acute osseous abnormality. Possible soft tissue ulcer at the tip of the fourth digit Electronically Signed   By: Donavan Foil M.D.   On: 11/24/2020 21:16   ECHOCARDIOGRAM COMPLETE  Result Date: 11/25/2020    ECHOCARDIOGRAM REPORT   Patient Name:   [name]. Date of Exam: 11/25/2020 Medical Rec #:  [id number]            Height:       75.0 in Accession #:    [id number]           Weight:       205.0 lb Date of Birth:  Mar 04, 1970             BSA:          2.218 m Patient Age:    74 years             BP:           130/76 mmHg Patient Gender: M                    HR:           71 bpm.  Exam Location:  [name] Procedure: 2D Echo, Color Doppler, Cardiac Doppler and Strain Analysis Indications:     I21.4 NSTEMI  History:         Patient has prior history of Echocardiogram examinations. CHF,                  CKD; Risk Factors:Hypertension, Diabetes and Dyslipidemia.  Sonographer:     Charmayne Sheer Referring Phys:  [id number] Loletha Grayer Diagnosing Phys: Nelva Bush MD  Sonographer Comments: Suboptimal subcostal window. Global longitudinal strain was attempted. IMPRESSIONS  1. Left ventricular ejection fraction, by estimation, is 55 to 60%. The left ventricle has normal function. The left ventricle has no regional wall motion abnormalities. The left ventricular internal cavity size was borderline dilated. There is moderate  left ventricular hypertrophy. Left ventricular diastolic parameters are consistent with Grade II diastolic dysfunction (pseudonormalization). Elevated left atrial pressure. The average left ventricular global longitudinal strain is -13.0 %. The global longitudinal strain is abnormal.  2. Right ventricular systolic function is normal. The right ventricular size is mildly enlarged. Tricuspid regurgitation signal is inadequate for assessing PA pressure.  3. Left atrial size was mild to moderately dilated.  4. Right atrial size was mildly dilated.  5. There is a small to moderate pericardial effusion. There is no evidence of cardiac tamponade.  6. The mitral valve is normal in structure. Trivial mitral valve regurgitation. No evidence of mitral stenosis.  7. The aortic valve is tricuspid. Aortic valve regurgitation is not visualized. No aortic stenosis is present.  8. Aortic dilatation noted. There is borderline dilatation of the aortic root, measuring 38 mm.  9. Mildly dilated pulmonary artery. 10. The inferior vena cava is dilated in size with >50% respiratory variability, suggesting right atrial pressure of 8 mmHg. FINDINGS  Left Ventricle: Left ventricular ejection fraction, by  estimation, is 55 to 60%. The left ventricle has normal function. The left ventricle has no regional wall motion abnormalities. The average left ventricular global longitudinal strain is -13.0 %. The global longitudinal strain is abnormal. The left ventricular internal cavity size was borderline dilated. There is moderate left ventricular hypertrophy. Left ventricular diastolic parameters are consistent with Grade II diastolic dysfunction (  pseudonormalization). Elevated left atrial pressure. Right Ventricle: The right ventricular size is mildly enlarged. No increase in right ventricular wall thickness. Right ventricular systolic function is normal. Tricuspid regurgitation signal is inadequate for assessing PA pressure. Left Atrium: Left atrial size was mild to moderately dilated. Right Atrium: Right atrial size was mildly dilated. Pericardium: There is a small to moderate pericardial effusion. There is no evidence of cardiac tamponade. Mitral Valve: The mitral valve is normal in structure. Trivial mitral valve regurgitation. No evidence of mitral valve stenosis. MV peak gradient, 4.8 mmHg. The mean mitral valve gradient is 2.0 mmHg. Tricuspid Valve: The tricuspid valve is normal in structure. Tricuspid valve regurgitation is mild. Aortic Valve: The aortic valve is tricuspid. Aortic valve regurgitation is not visualized. No aortic stenosis is present. Aortic valve mean gradient measures 5.0 mmHg. Aortic valve peak gradient measures 7.7 mmHg. Aortic valve area, by VTI measures 3.71 cm. Pulmonic Valve: The pulmonic valve was normal in structure. Pulmonic valve regurgitation is not visualized. No evidence of pulmonic stenosis. Aorta: Aortic dilatation noted. There is borderline dilatation of the aortic root, measuring 38 mm. Pulmonary Artery: The pulmonary artery is mildly dilated. Venous: The inferior vena cava is dilated in size with greater than 50% respiratory variability, suggesting right atrial pressure of 8  mmHg. IAS/Shunts: The interatrial septum was not well visualized.  LEFT VENTRICLE PLAX 2D LVIDd:         5.63 cm  Diastology LVIDs:         3.56 cm  LV e' medial:    5.87 cm/s LV PW:         1.36 cm  LV E/e' medial:  18.9 LV IVS:        1.21 cm  LV e' lateral:   11.10 cm/s LVOT diam:     2.60 cm  LV E/e' lateral: 10.0 LV SV:         115 LV SV Index:   52       2D Longitudinal Strain LVOT Area:     5.31 cm 2D Strain GLS Avg:     -13.0 %  RIGHT VENTRICLE RV Basal diam:  4.56 cm TAPSE (M-mode): 2.2 cm LEFT ATRIUM              Index       RIGHT ATRIUM           Index LA diam:        4.80 cm  2.16 cm/m  RA Area:     21.10 cm LA Vol (A2C):   60.0 ml  27.06 ml/m RA Volume:   64.40 ml  29.04 ml/m LA Vol (A4C):   112.0 ml 50.51 ml/m LA Biplane Vol: 89.9 ml  40.54 ml/m  AORTIC VALVE                    PULMONIC VALVE AV Area (Vmax):    3.97 cm     PV Vmax:       1.16 m/s AV Area (Vmean):   3.76 cm     PV Vmean:      76.400 cm/s AV Area (VTI):     3.71 cm     PV VTI:        0.251 m AV Vmax:           139.00 cm/s  PV Peak grad:  5.4 mmHg AV Vmean:          106.000 cm/s PV Mean grad:  3.0 mmHg AV  VTI:            0.309 m AV Peak Grad:      7.7 mmHg AV Mean Grad:      5.0 mmHg LVOT Vmax:         104.00 cm/s LVOT Vmean:        75.000 cm/s LVOT VTI:          0.216 m LVOT/AV VTI ratio: 0.70  AORTA Ao Root diam: 3.80 cm MITRAL VALVE MV Area (PHT): 4.26 cm     SHUNTS MV Area VTI:   3.98 cm     Systemic VTI:  0.22 m MV Peak grad:  4.8 mmHg     Systemic Diam: 2.60 cm MV Mean grad:  2.0 mmHg MV Vmax:       1.09 m/s MV Vmean:      67.2 cm/s MV Decel Time: 178 msec MV E velocity: 111.00 cm/s MV A velocity: 53.60 cm/s MV E/A ratio:  2.07 Nelva Bush MD Electronically signed by Nelva Bush MD Signature Date/Time: 11/25/2020/12:02:59 PM    Final     Cardiac Studies   TTEcho 11/25/2020  1. Left ventricular ejection fraction, by estimation, is 55 to 60%. The  left ventricle has normal function. The left ventricle has no  regional  wall motion abnormalities. The left ventricular internal cavity size was  borderline dilated. There is moderate   left ventricular hypertrophy. Left ventricular diastolic parameters are  consistent with Grade II diastolic dysfunction (pseudonormalization).  Elevated left atrial pressure. The average left ventricular global  longitudinal strain is -13.0 %. The global  longitudinal strain is abnormal.   2. Right ventricular systolic function is normal. The right ventricular  size is mildly enlarged. Tricuspid regurgitation signal is inadequate for  assessing PA pressure.   3. Left atrial size was mild to moderately dilated.   4. Right atrial size was mildly dilated.   5. There is a small to moderate pericardial effusion. There is no  evidence of cardiac tamponade.   6. The mitral valve is normal in structure. Trivial mitral valve  regurgitation. No evidence of mitral stenosis.   7. The aortic valve is tricuspid. Aortic valve regurgitation is not  visualized. No aortic stenosis is present.   8. Aortic dilatation noted. There is borderline dilatation of the aortic  root, measuring 38 mm.   9. Mildly dilated pulmonary artery.  10. The inferior vena cava is dilated in size with >50% respiratory  variability, suggesting right atrial pressure of 8 mmHg.   Patient Profile     50 y.o. male with history of paroxysmal atrial fibrillation, CAD/acute MI s/p DES to LAD, end-stage renal disease on HD presenting with presyncopal symptoms, found to have gangrenous fourth finger of his right arm, being seen for elevated troponins and A. fib RVR  Assessment & Plan    Paroxysmal A. Fib -Currently in sinus rhythm -Continue amiodarone -Recommend Eliquis after no additional intervention/procedure is planned.  2.  CAD/DES to the LAD -Aspirin Plavix for now, -Eliquis, Plavix upon discharge  3.  Right upper extremity 4th digit gangrene -Angiogram/procedures as per vascular surgery  4.   Microcytic anemia -Recommend work-up for etiology, especially as Eliquis is being considered for A. fib.  Total encounter time 35 minutes  Greater than 50% was spent in counseling and coordination of care with the patient       Signed, Kate Sable, MD  11/26/2020, 11:46 AM

## 2020-11-26 NOTE — Consult Note (Signed)
[facility] for heparin Indication: chest pain/ACS  Allergies  Allergen Reactions   Levofloxacin Swelling    Of face Other reaction(s): swelling   Promethazine Diarrhea and Other (See Comments)    Other reaction(s): Other (See Comments), Other (See Comments) Other reaction(s): Muscle cramps Muscle cramps Cramping Other reaction(s): Muscle cramps Muscle cramps   Other     Other reaction(s): Unknown   Malt Other (See Comments)    Other reaction(s): cramping    Patient Measurements: Height: '6\' 3"'$  (190.5 cm) Weight: 92.5 kg (203 lb 14.8 oz) IBW/kg (Calculated) : 84.5 Heparin Dosing Weight: 93 kg  Vital Signs: Temp: 98.4 F (36.9 C) (10/05 1956) Temp Source: Oral (10/05 1956) BP: 171/67 (10/05 1956) Pulse Rate: 70 (10/05 1956)  Labs: Recent Labs    11/24/20 1652 11/24/20 2036 11/24/20 2210 11/25/20 0523 11/25/20 0749 11/25/20 1547 11/25/20 1748 11/26/20 0257 11/26/20 1044 11/26/20 2119  HGB 9.2*  --   --  8.4*  --   --   --  8.0*  --   --   HCT 29.3*  --   --  26.3*  --   --   --  26.3*  --   --   PLT 243  --   --  222  --   --   --  197  --   --   APTT  --  35  --   --   --   --   --   --   --   --   LABPROT  --  15.6*  --   --   --   --   --   --   --   --   INR  --  1.2  --   --   --   --   --   --   --   --   HEPARINUNFRC  --   --   --   --  0.17*  --    < > 0.36 0.12* 0.16*  CREATININE 12.64*  --   --  13.89*  --   --   --  14.92*  --   --   TROPONINIHS 104*  --    < >  --  1,556* 1,422*  --   --  913*  --    < > = values in this interval not displayed.     Estimated Creatinine Clearance: 6.9 mL/min (A) (by C-G formula based on SCr of 14.92 mg/dL (H)).   Medical History: Past Medical History:  Diagnosis Date   Anxiety    Cellulitis    CHF (congestive heart failure) (HCC)    Chronic diarrhea    Chronic kidney disease    Chronic pain of both ankles    Diabetes mellitus without complication (HCC)    Diabetes  mellitus, type II (HCC)    Diabetic neuropathy (HCC)    Frequent falls    Gait instability    GERD (gastroesophageal reflux disease)    Heart palpitations    History of kidney stones    HLD (hyperlipidemia)    Hypertension    Insomnia    Nausea and vomiting in adult    recurrent    Medications:  No pertinent allergies No PTA anticoagulation  PTA: ASA 81 mg and plavix 75 mg daily   Assessment: 50 y.o. male with a past medical history of anxiety, CHF, DM, GERD, HDL, HTN, ESRD on HD MWF, A. fib not  on anticoagulation, CAD, and PVD who presents with complaints of dizziness and syncope. Pt was found to have a troponin elevated to 731 as well as in A. fib with RVR. Pharmacy has been consulted for heparin dosing for ACS.   Baseline labs:  aPTT 35 INR 1.2 Hgb 6 Plts 243  10/04 0749 HL 0.17 @ 1200 units/hr 10/04 1748 HL 0.27 @ 1450 units/hr 10/05 0257 HL 0.36 @ 1600 units/hr, therapeutic x 1 10/05 2119 hl 0.16 @ 1600 units/hr  Goal of Therapy:  Heparin level 0.3-0.7 units/ml Monitor platelets by anticoagulation protocol: Yes   Plan:  Heparin bolus of 3000 units Increase heparin infusion to 1800 units/hr Heparin to stop at 0600 for planned procedure, will not order a follow up level as it would be due at 0600. Continue to monitor H&H and platelets  Paulina Fusi, PharmD, BCPS 11/26/2020 9:58 PM

## 2020-11-26 NOTE — Progress Notes (Signed)
Date and time results received: 11/26/20 11:24 AM  Test: troponin  Critical Value: 913  Name of Provider Notified: Dr. Roosevelt Locks

## 2020-11-27 ENCOUNTER — Other Ambulatory Visit: Payer: Self-pay | Admitting: Nurse Practitioner

## 2020-11-27 ENCOUNTER — Encounter: Payer: Self-pay | Admitting: Vascular Surgery

## 2020-11-27 ENCOUNTER — Inpatient Hospital Stay: Payer: Medicare Other | Admitting: Registered Nurse

## 2020-11-27 ENCOUNTER — Other Ambulatory Visit: Payer: Self-pay

## 2020-11-27 ENCOUNTER — Inpatient Hospital Stay
Admit: 2020-11-27 | Discharge: 2020-11-27 | Disposition: A | Discharge status: Home / Self Care | Payer: Medicare Other | Attending: Nurse Practitioner | Admitting: Nurse Practitioner

## 2020-11-27 ENCOUNTER — Encounter
Admission: EM | Disposition: A | Discharge status: Home / Self Care | Payer: Self-pay | Source: Home / Self Care | Attending: Internal Medicine

## 2020-11-27 DIAGNOSIS — N186 End stage renal disease: Secondary | ICD-10-CM

## 2020-11-27 DIAGNOSIS — I4729 Other ventricular tachycardia: Secondary | ICD-10-CM

## 2020-11-27 DIAGNOSIS — I25118 Atherosclerotic heart disease of native coronary artery with other forms of angina pectoris: Secondary | ICD-10-CM

## 2020-11-27 DIAGNOSIS — D649 Anemia, unspecified: Secondary | ICD-10-CM | POA: Diagnosis not present

## 2020-11-27 DIAGNOSIS — R55 Syncope and collapse: Secondary | ICD-10-CM

## 2020-11-27 DIAGNOSIS — L03011 Cellulitis of right finger: Secondary | ICD-10-CM | POA: Diagnosis not present

## 2020-11-27 DIAGNOSIS — T82898A Other specified complication of vascular prosthetic devices, implants and grafts, initial encounter: Secondary | ICD-10-CM

## 2020-11-27 DIAGNOSIS — I480 Paroxysmal atrial fibrillation: Secondary | ICD-10-CM | POA: Diagnosis not present

## 2020-11-27 DIAGNOSIS — I472 Ventricular tachycardia, unspecified: Secondary | ICD-10-CM

## 2020-11-27 DIAGNOSIS — I96 Gangrene, not elsewhere classified: Secondary | ICD-10-CM

## 2020-11-27 DIAGNOSIS — Y832 Surgical operation with anastomosis, bypass or graft as the cause of abnormal reaction of the patient, or of later complication, without mention of misadventure at the time of the procedure: Secondary | ICD-10-CM

## 2020-11-27 DIAGNOSIS — R778 Other specified abnormalities of plasma proteins: Secondary | ICD-10-CM | POA: Diagnosis not present

## 2020-11-27 LAB — BASIC METABOLIC PANEL
Anion gap: 10 (ref 5–15)
BUN: 36 mg/dL — ABNORMAL HIGH (ref 6–20)
CO2: 25 mmol/L (ref 22–32)
Calcium: 7.8 mg/dL — ABNORMAL LOW (ref 8.9–10.3)
Chloride: 102 mmol/L (ref 98–111)
Creatinine, Ser: 9.75 mg/dL — ABNORMAL HIGH (ref 0.61–1.24)
GFR, Estimated: 6 mL/min — ABNORMAL LOW (ref >60)
Glucose, Bld: 86 mg/dL (ref 70–99)
Potassium: 3.6 mmol/L (ref 3.5–5.1)
Sodium: 137 mmol/L (ref 135–145)

## 2020-11-27 LAB — TYPE AND SCREEN
ABO/RH(D): A POS
Antibody Screen: NEGATIVE

## 2020-11-27 LAB — CBC
HCT: 26.4 % — ABNORMAL LOW (ref 39.0–52.0)
Hemoglobin: 8.2 g/dL — ABNORMAL LOW (ref 13.0–17.0)
MCH: 23.3 pg — ABNORMAL LOW (ref 26.0–34.0)
MCHC: 31.1 g/dL (ref 30.0–36.0)
MCV: 75 fL — ABNORMAL LOW (ref 80.0–100.0)
Platelets: 194 10*3/uL (ref 150–400)
RBC: 3.52 MIL/uL — ABNORMAL LOW (ref 4.22–5.81)
RDW: 19.4 % — ABNORMAL HIGH (ref 11.5–15.5)
WBC: 9.5 10*3/uL (ref 4.0–10.5)
nRBC: 0.2 % (ref 0.0–0.2)

## 2020-11-27 LAB — PROTIME-INR
INR: 1.2 (ref 0.8–1.2)
Prothrombin Time: 15 seconds (ref 11.4–15.2)

## 2020-11-27 LAB — APTT: aPTT: 90 seconds — ABNORMAL HIGH (ref 24–36)

## 2020-11-27 LAB — GLUCOSE, CAPILLARY: Glucose-Capillary: 76 mg/dL (ref 70–99)

## 2020-11-27 LAB — MAGNESIUM: Magnesium: 1.8 mg/dL (ref 1.7–2.4)

## 2020-11-27 SURGERY — BANDING HERO GRAFT
Anesthesia: General | Site: Arm Upper | Laterality: Right

## 2020-11-27 MED ORDER — SODIUM CHLORIDE 0.9 % IV SOLN: INTRAVENOUS | Status: DC | PRN

## 2020-11-27 MED ORDER — LIDOCAINE HCL (CARDIAC) PF 100 MG/5ML IV SOSY
PREFILLED_SYRINGE | INTRAVENOUS | Status: DC | PRN
  Administered 2020-11-27: 60 mg via INTRAVENOUS

## 2020-11-27 MED ORDER — PROPOFOL 500 MG/50ML IV EMUL
INTRAVENOUS | Status: DC | PRN
  Administered 2020-11-27: 125 ug/kg/min via INTRAVENOUS

## 2020-11-27 MED ORDER — AMIODARONE HCL 200 MG PO TABS: 200.0000 mg | ORAL_TABLET | Freq: Two times a day (BID) | ORAL | Status: DC

## 2020-11-27 MED ORDER — HEPARIN SODIUM (PORCINE) 1000 UNIT/ML DIALYSIS
1000.0000 [IU] | INTRAMUSCULAR | Status: DC | PRN
  Filled 2020-11-27: qty 1

## 2020-11-27 MED ORDER — APIXABAN 5 MG PO TABS: 5.0000 mg | ORAL_TABLET | Freq: Two times a day (BID) | ORAL | 0 refills | Status: DC

## 2020-11-27 MED ORDER — PROPOFOL 10 MG/ML IV BOLUS
INTRAVENOUS | Status: AC
  Filled 2020-11-27: qty 20

## 2020-11-27 MED ORDER — FENTANYL CITRATE (PF) 100 MCG/2ML IJ SOLN
INTRAMUSCULAR | Status: DC | PRN
  Administered 2020-11-27 (×2): 12.5 ug via INTRAVENOUS

## 2020-11-27 MED ORDER — SODIUM CHLORIDE 0.9 % IV SOLN: 100.0000 mL | INTRAVENOUS | Status: DC | PRN

## 2020-11-27 MED ORDER — ALTEPLASE 2 MG IJ SOLR: 2.0000 mg | Freq: Once | INTRAMUSCULAR | Status: DC | PRN

## 2020-11-27 MED ORDER — ONDANSETRON HCL 4 MG/2ML IJ SOLN: 4.0000 mg | Freq: Once | INTRAMUSCULAR | Status: DC | PRN

## 2020-11-27 MED ORDER — APIXABAN 5 MG PO TABS: 5.0000 mg | ORAL_TABLET | Freq: Two times a day (BID) | ORAL | Status: DC

## 2020-11-27 MED ORDER — PROPOFOL 10 MG/ML IV BOLUS
INTRAVENOUS | Status: DC | PRN
  Administered 2020-11-27 (×2): 30 mg via INTRAVENOUS

## 2020-11-27 MED ORDER — LIDOCAINE HCL (PF) 1 % IJ SOLN
5.0000 mL | INTRAMUSCULAR | Status: DC | PRN
  Filled 2020-11-27: qty 5

## 2020-11-27 MED ORDER — MIDAZOLAM HCL 2 MG/2ML IJ SOLN
INTRAMUSCULAR | Status: AC
  Filled 2020-11-27: qty 2

## 2020-11-27 MED ORDER — LIDOCAINE HCL (PF) 2 % IJ SOLN
INTRAMUSCULAR | Status: AC
  Filled 2020-11-27: qty 5

## 2020-11-27 MED ORDER — CALCIUM ACETATE (PHOS BINDER) 667 MG PO CAPS: 2001.0000 mg | ORAL_CAPSULE | Freq: Three times a day (TID) | ORAL | 0 refills | Status: DC

## 2020-11-27 MED ORDER — PENTAFLUOROPROP-TETRAFLUOROETH EX AERO
1.0000 "application " | INHALATION_SPRAY | CUTANEOUS | Status: DC | PRN
  Filled 2020-11-27: qty 30

## 2020-11-27 MED ORDER — LACTATED RINGERS IV SOLN: INTRAVENOUS | Status: DC

## 2020-11-27 MED ORDER — BUPIVACAINE-EPINEPHRINE (PF) 0.5% -1:200000 IJ SOLN
INTRAMUSCULAR | Status: AC
  Filled 2020-11-27: qty 30

## 2020-11-27 MED ORDER — CEFAZOLIN SODIUM-DEXTROSE 1-4 GM/50ML-% IV SOLN
INTRAVENOUS | Status: AC
  Filled 2020-11-27: qty 50

## 2020-11-27 MED ORDER — EPHEDRINE 5 MG/ML INJ
INTRAVENOUS | Status: AC
  Filled 2020-11-27: qty 10

## 2020-11-27 MED ORDER — ACETAMINOPHEN 10 MG/ML IV SOLN: 1000.0000 mg | Freq: Once | INTRAVENOUS | Status: DC | PRN

## 2020-11-27 MED ORDER — MIDAZOLAM HCL 2 MG/2ML IJ SOLN
INTRAMUSCULAR | Status: DC | PRN
  Administered 2020-11-27: 1 mg via INTRAVENOUS

## 2020-11-27 MED ORDER — FENTANYL CITRATE (PF) 100 MCG/2ML IJ SOLN: 25.0000 ug | INTRAMUSCULAR | Status: DC | PRN

## 2020-11-27 MED ORDER — BUPIVACAINE-EPINEPHRINE (PF) 0.5% -1:200000 IJ SOLN
INTRAMUSCULAR | Status: DC | PRN
  Administered 2020-11-27: 5 mL

## 2020-11-27 MED ORDER — OXYCODONE HCL 5 MG/5ML PO SOLN
5.0000 mg | Freq: Once | ORAL | Status: DC | PRN
Start: 2020-11-27 — End: 2020-11-27

## 2020-11-27 MED ORDER — OXYCODONE HCL 5 MG PO TABS
5.0000 mg | ORAL_TABLET | Freq: Once | ORAL | Status: DC | PRN
Start: 2020-11-27 — End: 2020-11-27

## 2020-11-27 MED ORDER — FENTANYL CITRATE (PF) 100 MCG/2ML IJ SOLN
INTRAMUSCULAR | Status: AC
  Filled 2020-11-27: qty 2

## 2020-11-27 MED ORDER — LIDOCAINE-PRILOCAINE 2.5-2.5 % EX CREA
1.0000 "application " | TOPICAL_CREAM | CUTANEOUS | Status: DC | PRN
  Filled 2020-11-27: qty 5

## 2020-11-27 SURGICAL SUPPLY — 38 items
BLADE SURG SZ11 CARB STEEL (BLADE) ×2 IMPLANT
BOOT SUTURE AID YELLOW STND (SUTURE) ×2 IMPLANT
BRUSH SCRUB EZ  4% CHG (MISCELLANEOUS) ×1
BRUSH SCRUB EZ 4% CHG (MISCELLANEOUS) ×1 IMPLANT
CHLORAPREP W/TINT 26 (MISCELLANEOUS) ×2 IMPLANT
DERMABOND ADVANCED (GAUZE/BANDAGES/DRESSINGS) ×1
DERMABOND ADVANCED .7 DNX12 (GAUZE/BANDAGES/DRESSINGS) ×1 IMPLANT
ELECT CAUTERY BLADE 6.4 (BLADE) ×1 IMPLANT
ELECT REM PT RETURN 9FT ADLT (ELECTROSURGICAL) ×2
ELECTRODE REM PT RTRN 9FT ADLT (ELECTROSURGICAL) ×1 IMPLANT
GAUZE 4X4 16PLY ~~LOC~~+RFID DBL (SPONGE) ×2 IMPLANT
GLOVE SURG ENC MOIS LTX SZ7 (GLOVE) ×2 IMPLANT
GLOVE SURG SYN 7.0 (GLOVE) ×2 IMPLANT
GLOVE SURG SYN 7.0 PF PI (GLOVE) ×1 IMPLANT
GLOVE SURG UNDER LTX SZ7.5 (GLOVE) ×2 IMPLANT
GOWN STRL REUS W/ TWL LRG LVL3 (GOWN DISPOSABLE) ×1 IMPLANT
GOWN STRL REUS W/ TWL XL LVL3 (GOWN DISPOSABLE) ×2 IMPLANT
GOWN STRL REUS W/TWL LRG LVL3 (GOWN DISPOSABLE) ×1
GOWN STRL REUS W/TWL XL LVL3 (GOWN DISPOSABLE) ×2
HEMOSTAT SURGICEL 2X3 (HEMOSTASIS) ×2 IMPLANT
KIT TURNOVER KIT A (KITS) ×2 IMPLANT
LABEL OR SOLS (LABEL) ×2 IMPLANT
LOOP RED MAXI  1X406MM (MISCELLANEOUS) ×1
LOOP VESSEL MAXI 1X406 RED (MISCELLANEOUS) ×1 IMPLANT
LOOP VESSEL MINI 0.8X406 BLUE (MISCELLANEOUS) ×1 IMPLANT
LOOPS BLUE MINI 0.8X406MM (MISCELLANEOUS) ×1
MANIFOLD NEPTUNE II (INSTRUMENTS) ×2 IMPLANT
PACK EXTREMITY ARMC (MISCELLANEOUS) ×2 IMPLANT
PAD PREP 24X41 OB/GYN DISP (PERSONAL CARE ITEMS) ×2 IMPLANT
STOCKINETTE 48X4 2 PLY STRL (GAUZE/BANDAGES/DRESSINGS) ×1 IMPLANT
STOCKINETTE STRL 4IN 9604848 (GAUZE/BANDAGES/DRESSINGS) ×2 IMPLANT
SUT MNCRL AB 4-0 PS2 18 (SUTURE) ×1 IMPLANT
SUT PROLENE 6 0 BV (SUTURE) ×1 IMPLANT
SUT SILK 0 (SUTURE) ×1
SUT SILK 0 30XBRD TIE 6 (SUTURE) ×1 IMPLANT
SUT VIC AB 3-0 SH 27 (SUTURE)
SUT VIC AB 3-0 SH 27X BRD (SUTURE) ×1 IMPLANT
WATER STERILE IRR 500ML POUR (IV SOLUTION) ×4 IMPLANT

## 2020-11-27 NOTE — TOC Initial Note (Signed)
Transition of Care (TOC) - Initial/Assessment Note    Patient Details  Name: [name]. MRN: [id number] Date of Birth: 28-Aug-1970  Transition of Care [facility]) CM/SW Contact:    Alberteen Sam, LCSW Phone Number: 11/27/2020, 3:07 PM  Clinical Narrative:                  CSW completed readmission risk assessment, patient reports no discharge needs identified. Reports he continues to see Dr. [name] Darter as his PCP listed in chart, no issues getting medications or with transportation. Reports his spouse is picking him up for discharge today.   Expected Discharge Plan: Home/Self Care Barriers to Discharge: No Barriers Identified   Patient Goals and CMS Choice Patient states their goals for this hospitalization and ongoing recovery are:: to go  home CMS Medicare.gov Compare Post Acute Care list provided to:: Patient Choice offered to / list presented to : Patient  Expected Discharge Plan and Services Expected Discharge Plan: Home/Self Care       Living arrangements for the past 2 months: Single Family Home Expected Discharge Date: 11/27/20                                    Prior Living Arrangements/Services Living arrangements for the past 2 months: Single Family Home Lives with:: Spouse Patient language and need for interpreter reviewed:: Yes        Need for Family Participation in Patient Care: Yes (Comment) Care giver support system in place?: Yes (comment)   Criminal Activity/Legal Involvement Pertinent to Current Situation/Hospitalization: No - Comment as needed  Activities of Daily Living Home Assistive Devices/Equipment: Cane (specify quad or straight) ADL Screening (condition at time of admission) Patient's cognitive ability adequate to safely complete daily activities?: Yes Is the patient deaf or have difficulty hearing?: No Does the patient have difficulty seeing, even when wearing glasses/contacts?: No Does the patient have difficulty concentrating,  remembering, or making decisions?: No Patient able to express need for assistance with ADLs?: Yes Does the patient have difficulty dressing or bathing?: No Independently performs ADLs?: Yes (appropriate for developmental age) Does the patient have difficulty walking or climbing stairs?: No Weakness of Legs: Both Weakness of Arms/Hands: Both  Permission Sought/Granted                  Emotional Assessment Appearance:: Appears stated age Attitude/Demeanor/Rapport: Gracious Affect (typically observed): Calm Orientation: : Oriented to Self, Oriented to Place, Oriented to  Time, Oriented to Situation Alcohol / Substance Use: Not Applicable Psych Involvement: No (comment)  Admission diagnosis:  Infection [B99.9] ESRD (end stage renal disease) ([place]) [N18.6] NSTEMI (non-ST elevated myocardial infarction) ([place]) [I21.4] Cellulitis of finger of right hand [id number] Atrial fibrillation with RVR ([place]) [I48.91] Left leg cellulitis [id number] Fall, initial encounter [W19.XXXA] Gangrene of finger ([place]) [I96] Syncope, unspecified syncope type [R55] Postural dizziness with presyncope [R42, R55] Patient Active Problem List   Diagnosis Date Noted   Gangrene of finger of right hand ([place]) 11/25/2020   Postural dizziness with presyncope 11/24/2020   Elevated troponin 11/24/2020   Left leg cellulitis 11/24/2020   Frequent falls 11/24/2020   Finger infection, fourth left 11/24/2020   AF (paroxysmal atrial fibrillation) ([place]) 11/24/2020   Hypotension 11/24/2020   NSTEMI (non-ST elevated myocardial infarction) ([place]) 11/24/2020   Abnormal gait 08/19/2020   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[place]) 08/19/2020   Anxiety state 08/19/2020   Callosity 08/19/2020  ED (erectile dysfunction) of organic origin 08/19/2020   Heart failure ([place]) 08/19/2020   Long term (current) use of insulin ([place]) 08/19/2020   Myalgia 08/19/2020   Obstructive uropathy 08/19/2020   Reactive depression 08/19/2020    History of ST elevation myocardial infarction (STEMI) 02/2020 s/p DES to LAD 08/19/2020   ESRD (end stage renal disease) ([place]) 08/19/2020   Blind painful left eye 08/15/2020   Combined forms of age-related cataract of right eye 04/17/2020   Supraventricular tachycardia ([place]) 02/26/2020   Acute anterior wall MI ([place])    Benign prostatic hyperplasia with lower urinary tract symptoms 11/07/2019   Fluid overload, unspecified 08/15/2019   Hypertension secondary to other renal disorders 07/20/2019   NVG (neovascular glaucoma), left, 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   Symptomatic anemia 06/05/2019   Aftercare including intermittent dialysis ([place]) 05/15/2019   Moderate protein-calorie malnutrition ([place]) 05/14/2019   Anaphylactic reaction due to adverse effect of correct drug or medicament properly administered, initial encounter 05/08/2019   Chronic anemia 05/07/2019   Chest pain, unspecified 05/07/2019   Coagulation defect, unspecified ([place]) 05/07/2019   Disorder of phosphorus metabolism, unspecified 05/07/2019   Encounter for fitting and adjustment of extracorporeal dialysis catheter ([place]) 05/07/2019   Gastroparesis 05/07/2019   Iron deficiency anemia, unspecified 05/07/2019   Major depressive disorder, recurrent, unspecified ([place]) 05/07/2019   Pain, unspecified 05/07/2019   Pruritus, unspecified 05/07/2019   Dizziness Q000111Q   Metabolic acidosis Q000111Q   Syncope 04/06/2019   Urinary retention 04/06/2019   Hypertensive chronic kidney disease with stage 1 through stage 4 chronic kidney disease, or unspecified chronic kidney disease 11/28/2018   Edema 11/28/2018   Hypokalemia 11/28/2018   Hypo-osmolality and hyponatremia 11/28/2018   Proteinuria 11/28/2018   Secondary hyperparathyroidism of renal origin ([place]) 11/28/2018   Lymphedema 01/09/2018   Chronic diastolic heart failure ([place]) 12/13/2017   Vomiting 11/28/2017   Acute on chronic heart failure ([place]) 11/23/2017   Type II  diabetes mellitus ([place]) 11/23/2017   Diabetic retinopathy ([place]) 07/05/2017   Uncontrolled diabetes mellitus 07/08/2016   Uncontrolled type 2 diabetes mellitus with hyperglycemia, with long-term current use of insulin ([place]) 04/20/2016   Osteomyelitis of toe of left foot ([place]) 03/15/2016   Acute renal failure with tubular necrosis ([place]) 06/23/2015   Diarrhea 06/23/2015   Mixed hyperlipidemia    Essential hypertension, benign    Heart palpitations 05/06/2015   Sepsis ([place]) 03/07/2015   PCP:  [name], MD Pharmacy:   [place], [place] - [address] [place] [address] Phone: [phone] Fax: [phone]  [facility] [address] [place] [address] Phone: [phone] Fax: [phone]     Social Determinants of Health (SDOH) Interventions    Readmission Risk Interventions Readmission Risk Prevention Plan 11/27/2020 02/28/2020  Transportation Screening Complete Complete  PCP or Specialist Appt within 3-5 Days Complete Complete  HRI or [place] Complete Complete  Social Work Consult for [place] Planning/Counseling Complete Complete  Palliative Care Screening Not Applicable Not Applicable  Medication Review Press photographer) Complete Complete  Some recent data might be hidden

## 2020-11-27 NOTE — Anesthesia Postprocedure Evaluation (Signed)
Anesthesia Post Note  Patient: Tristian Rairigh.  Procedure(s) Performed: BANDING HERO GRAFT (Right: Arm Upper)  Patient location during evaluation: PACU Anesthesia Type: General Level of consciousness: awake and alert, oriented and patient cooperative Pain management: pain level controlled Vital Signs Assessment: post-procedure vital signs reviewed and stable Respiratory status: spontaneous breathing, nonlabored ventilation and respiratory function stable Cardiovascular status: blood pressure returned to baseline and stable Postop Assessment: adequate PO intake Anesthetic complications: no   No notable events documented.   Last Vitals:  Vitals:   11/27/20 1345 11/27/20 1400  BP: 128/71 (!) 152/79  Pulse: (!) 58 (!) 59  Resp: 14 12  Temp:    SpO2: 97% 98%    Last Pain:  Vitals:   11/27/20 1400  TempSrc:   PainSc: 0-No pain                 Darrin Nipper

## 2020-11-27 NOTE — Progress Notes (Signed)
[facility]  ROUNDING NOTE   Subjective:   [name]. was admitted to [facility] on 11/24/2020 for Infection [B99.9] ESRD (end stage renal disease) ([place]) [N18.6] NSTEMI (non-ST elevated myocardial infarction) ([place]) [I21.4] Cellulitis of finger of right hand [id number] Atrial fibrillation with RVR ([place]) [I48.91] Left leg cellulitis [id number] Fall, initial encounter [W19.XXXA] Gangrene of finger ([place]) [I96] Syncope, unspecified syncope type [R55] Postural dizziness with presyncope [R42, R55]  Last hemodialysis treatment was yesterday. Completed 3 and 1/2 hours of his treatment. Left at 88.5kg. he was under his dry weight on initiation of dialysis yesterday.   Patient seen laying in bed  Alert and oriented Currently NPO for procedure Dialysis treatment yesterday, tolerated well   Objective:  Vital signs in last 24 hours:  Temp:  [97.6 F (36.4 C)-99.7 F (37.6 C)] 97.6 F (36.4 C) (10/06 1332) Pulse Rate:  [57-77] 57 (10/06 1332) Resp:  [10-22] 11 (10/06 1332) BP: (112-185)/(61-83) 112/61 (10/06 1332) SpO2:  [97 %-100 %] 97 % (10/06 1332) Weight:  [92.5 kg] 92.5 kg (10/05 1759)  Weight change:  Filed Weights   11/26/20 0755 11/26/20 1315 11/26/20 1759  Weight: 90.7 kg (P) 91.5 kg 92.5 kg    Intake/Output: I/O last 3 completed shifts: In: 844.2 [P.O.:480; I.V.:265.4; IV Piggyback:98.7] Out: 0    Intake/Output this shift:  Total I/O In: 377.3 [I.V.:277.3; IV Piggyback:100] Out: -   Physical Exam: General: NAD, laying in bed  Head: Normocephalic, atraumatic. Moist oral mucosal membranes  Eyes: Scleral opacities.   Lungs:  Clear to auscultation, normal effort  Heart: Regular rate and rhythm  Abdomen:  Soft, nontender  Extremities:  no peripheral edema.  Neurologic: Nonfocal, moving all four extremities  Skin: Left thigh lesion  Access: Right AVF    Basic Metabolic Panel: Recent Labs  Lab 11/24/20 1652 11/25/20 0523 11/26/20 0257  11/27/20 0534  NA 142 141 141 137  K 3.7 4.0 4.5 3.6  CL 113* 112* 113* 102  CO2 18* 17* 17* 25  GLUCOSE 145* 88 74 86  BUN 52* 58* 69* 36*  CREATININE 12.64* 13.89* 14.92* 9.75*  CALCIUM 7.9* 7.7* 7.6* 7.8*  MG 2.0  --   --  1.8     Liver Function Tests: No results for input(s): AST, ALT, ALKPHOS, BILITOT, PROT, ALBUMIN in the last 168 hours. No results for input(s): LIPASE, AMYLASE in the last 168 hours. No results for input(s): AMMONIA in the last 168 hours.  CBC: Recent Labs  Lab 11/24/20 1652 11/25/20 0523 11/26/20 0257 11/27/20 0534  WBC 8.5 9.1 10.0 9.5  NEUTROABS 7.2  --   --   --   HGB 9.2* 8.4* 8.0* 8.2*  HCT 29.3* 26.3* 26.3* 26.4*  MCV 75.3* 73.9* 76.2* 75.0*  PLT 243 222 197 194     Cardiac Enzymes: No results for input(s): CKTOTAL, CKMB, CKMBINDEX, TROPONINI in the last 168 hours.  BNP: Invalid input(s): POCBNP  CBG: Recent Labs  Lab 11/25/20 1636 11/25/20 2121 11/26/20 0758 11/26/20 1212 11/27/20 1333  GLUCAP 115* 97 79 126* 41     Microbiology: Results for orders placed or performed during the hospital encounter of 11/24/20  Blood culture (routine single)     Status: None (Preliminary result)   Collection Time: 11/24/20  8:36 PM   Specimen: BLOOD  Result Value Ref Range Status   Specimen Description BLOOD BLOOD LEFT FOREARM  Final   Special Requests   Final    BOTTLES DRAWN AEROBIC AND ANAEROBIC Blood  Culture adequate volume   Culture   Final    NO GROWTH 3 DAYS Performed at [facility], [address] [place], [place] [address]    Report Status PENDING  Incomplete  Resp Panel by RT-PCR (Flu A&B, Covid) Nasopharyngeal Swab     Status: None   Collection Time: 11/24/20  8:36 PM   Specimen: Nasopharyngeal Swab; Nasopharyngeal(NP) swabs in vial transport medium  Result Value Ref Range Status   SARS Coronavirus 2 by RT PCR NEGATIVE NEGATIVE Final    Comment: (NOTE) SARS-CoV-2 target nucleic acids are NOT DETECTED.  The  SARS-CoV-2 RNA is generally detectable in upper respiratory specimens during the acute phase of infection. The lowest concentration of SARS-CoV-2 viral copies this assay can detect is 138 copies/mL. A negative result does not preclude SARS-Cov-2 infection and should not be used as the sole basis for treatment or other patient management decisions. A negative result may occur with  improper specimen collection/handling, submission of specimen other than nasopharyngeal swab, presence of viral mutation(s) within the areas targeted by this assay, and inadequate number of viral copies(<138 copies/mL). A negative result must be combined with clinical observations, patient history, and epidemiological information. The expected result is Negative.  Fact Sheet for Patients:  EntrepreneurPulse.com.au  Fact Sheet for Healthcare Providers:  IncredibleEmployment.be  This test is no t yet approved or cleared by the [place] FDA and  has been authorized for detection and/or diagnosis of SARS-CoV-2 by FDA under an Emergency Use Authorization (EUA). This EUA will remain  in effect (meaning this test can be used) for the duration of the COVID-19 declaration under Section 564(b)(1) of the Act, 21 U.S.C.section 360bbb-3(b)(1), unless the authorization is terminated  or revoked sooner.       Influenza A by PCR NEGATIVE NEGATIVE Final   Influenza B by PCR NEGATIVE NEGATIVE Final    Comment: (NOTE) The Xpert Xpress SARS-CoV-2/FLU/RSV plus assay is intended as an aid in the diagnosis of influenza from Nasopharyngeal swab specimens and should not be used as a sole basis for treatment. Nasal washings and aspirates are unacceptable for Xpert Xpress SARS-CoV-2/FLU/RSV testing.  Fact Sheet for Patients: EntrepreneurPulse.com.au  Fact Sheet for Healthcare Providers: IncredibleEmployment.be  This test is not yet approved or  cleared by the [place] FDA and has been authorized for detection and/or diagnosis of SARS-CoV-2 by FDA under an Emergency Use Authorization (EUA). This EUA will remain in effect (meaning this test can be used) for the duration of the COVID-19 declaration under Section 564(b)(1) of the Act, 21 U.S.C. section 360bbb-3(b)(1), unless the authorization is terminated or revoked.  Performed at [facility], [address] [place], [place] [address]   MRSA Next Gen by PCR, Nasal     Status: None   Collection Time: 11/26/20  6:24 PM   Specimen: Nasal Mucosa; Nasal Swab  Result Value Ref Range Status   MRSA by PCR Next Gen NOT DETECTED NOT DETECTED Final    Comment: (NOTE) The GeneXpert MRSA Assay (FDA approved for NASAL specimens only), is one component of a comprehensive MRSA colonization surveillance program. It is not intended to diagnose MRSA infection nor to guide or monitor treatment for MRSA infections. Test performance is not FDA approved in patients less than 20 years old. Performed at [facility], [address], [place], [place] [address]     Coagulation Studies: Recent Labs    11/24/20 05/28/2034 11/27/20 0534  LABPROT 15.6* 15.0  INR 1.2 1.2  Urinalysis: No results for input(s): COLORURINE, LABSPEC, PHURINE, GLUCOSEU, HGBUR, BILIRUBINUR, KETONESUR, PROTEINUR, UROBILINOGEN, NITRITE, LEUKOCYTESUR in the last 72 hours.  Invalid input(s): APPERANCEUR    Imaging: PERIPHERAL VASCULAR CATHETERIZATION  Result Date: 11/26/2020 See surgical note for result.    Medications:    [MAR Hold] cefTRIAXone (ROCEPHIN)  IV Stopped (11/27/20 [id number])    [MAR Hold] amiodarone  200 mg Oral BID   apixaban  5 mg Oral BID   [MAR Hold] aspirin EC  81 mg Oral Daily   [MAR Hold] atorvastatin  80 mg Oral QHS   [MAR Hold] calcium acetate  2,001 mg Oral TID WC   [MAR Hold] carvedilol  12.5 mg Oral BID WC   [MAR Hold] Chlorhexidine Gluconate Cloth  6 each Topical  Q0600   [MAR Hold] clopidogrel  75 mg Oral Daily   [MAR Hold] dorzolamide-timolol  1 drop Right Eye BID   [MAR Hold] acetaminophen, [MAR Hold] hydrALAZINE, [MAR Hold] nitroGLYCERIN, [MAR Hold] ondansetron (ZOFRAN) IV  Assessment/ Plan:  Mr. Stacy Sorby. is a 50 y.o. black male with end stage renal disease on hemodialysis, hypertension, diabetes mellitus type II, diabetic retinopathy, diabetic neuropathy, diabetic gastroparesis, congestive heart failure, hyperlipidemia, BPH who is admitted to [facility] on 11/24/2020 for Infection [B99.9] ESRD (end stage renal disease) ([place]) [N18.6] NSTEMI (non-ST elevated myocardial infarction) ([place]) [I21.4] Cellulitis of finger of right hand [id number] Atrial fibrillation with RVR ([place]) [I48.91] Left leg cellulitis [L03.116] Fall, initial encounter [W19.XXXA] Gangrene of finger ([place]) [I96] Syncope, unspecified syncope type [R55] Postural dizziness with presyncope [R42, R55]  [facility] Left AVF 93kg  End stage renal disease: hemodialysis treatment yesterday as outpatient. Left well below his dry weight at 89kg. Does not appear volume overloaded. Potassium at goal.  - Received dialysis yesterday, tolerated well - Patient currently receives dialysis twice a week outpatient, but reports cramping before completing treatment and usually signs off early about 45 minutes each treatment. Discussed this with patient and offered to dialyze three times a week with a shorter treatment time. Patient declines this change and would prefer to stay twice a week. Will honor this at this time, but will address this again in the near future.  -Patient stated since he received dialysis yesterday, he will not dialyze on his regularly scheduled day, which would be Friday.  - Appreciate vascular for preforming right AVF banding today. They feel access will available for use at next dialysis treatment.   Hypertension with ESRD: 128/71. Patient follows with [facility], Dr. Tobin Chad, as outpatient. With irregular rhythm on admission. Now back in sinus. History of difficult to control. Current regimen of carvedilol and tamsulosin.  - ECHO shows EF 55-60% with level 2 diastolic dysfunction - Appreciate cardiology input.    Anemia with chronic kidney disease: hemoglobin 8.2. Microcytic. Iron studies were at goal on 09/01/20.  - iron studies within acceptable limits -Low dose EPO with dialysis treatment.   Secondary Hyperparathyroidism: with hyperphosphatemia.   - Calcium below target, but slowly improving - calcium acetate with meals.   Diabetes mellitus type II with chronic kidney disease: insulin dependent. Hemoglobin A1c of 5.3% on 08/2020.  Left thigh lesion: diagnosed as cellulitis. Examination is not consistent with calciphylaxis. No induration, nontender on examination. On amoxicillin as outpatient.   Complication of dialysis device: gangrene of third right digit.   - believed to be Steal syndrome, banding of right AVF today by vascular.  - Appreciate vascular evaluation and treatment.    LOS:  2   10/6/20221:42 PM

## 2020-11-27 NOTE — Op Note (Signed)
[facility]   OPERATIVE NOTE  DATE: 11/27/2020  PRE-OPERATIVE DIAGNOSIS: ESRD, steal syndrome right arm with gangrene of finger 4  POST-OPERATIVE DIAGNOSIS: same as above  PROCEDURE: 1.   Banding of Right brachiocephalic AVF  SURGEON: [name], MD  ASSISTANT(S): Hezzie Bump, PA-C  ANESTHESIA: local/MAC  ESTIMATED BLOOD LOSS: minimal  FINDING(S): 1.  none  SPECIMEN(S):  None  INDICATIONS:   Patient is a 50 y.o.male who presents with end-stage renal disease and gangrene of his right fourth finger and a large right brachiocephalic AV fistula on that side.  Had an angiogram done yesterday which showed his radial artery to be patent but relatively small.  His ulnar artery was occluded.  There were not really any endovascular options for improvement of his flow, we will going to try to band his fistula and hope that will improve his perfusion without having to ligate it completely. Risks and benefits were discussed and the patient was agreeable to proceed. An assistant was present during the procedure to help facilitate the exposure and expedite the procedure.   DESCRIPTION: After obtaining full informed written consent, the patient was brought back to the operating room and placed supine upon the operating table.  The patient received IV antibiotics prior to induction. The assistant provided retraction and mobilization to help facilitate exposure and expedite the procedure throughout the entire procedure.  This included following suture, using retractors, and optimizing lighting.  After obtaining adequate anesthesia, the patient was prepped and draped in the standard fashion. I created a small transverse incision in the mid to distal upper arm overlying the AVF.  The fistula was dissected out.  In that area, 2-0 Silk ties were used to band the AVF and reduce its size to about 3-4 mm.  There was still a thrill present proximal to the banding, but the fistula was  clearly narrowed and the flow reduced.  The wound was then irrigated and closed with a 3-0 Vicryl and a 4-0 Monocryl. Dermabond was placed as a dressing. The patient was taken to the recovery room in stable condition having tolerated the procedure well.  COMPLICATIONS: None  CONDITION: Stable   [name] 11/27/2020 1:25 PM  This note was created with Dragon Medical transcription system. Any errors in dictation are purely unintentional.

## 2020-11-27 NOTE — Progress Notes (Signed)
Order to discharge pt home.  Discharge instructions/AVS given to patient and reviewed - education provided as needed.  Pt advised to call PCP and/or come back to the hospital if there are any problems. Pt verbalized understanding.    

## 2020-11-27 NOTE — Anesthesia Preprocedure Evaluation (Addendum)
Anesthesia Evaluation  Patient identified by MRN, date of birth, ID band Patient awake    Reviewed: Allergy & Precautions, NPO status , Patient's Chart, lab work & pertinent test results  History of Anesthesia Complications Negative for: history of anesthetic complications  Airway Mallampati: IV   Neck ROM: Full    Dental  (+) Poor Dentition Lower right tooth loose:   Pulmonary neg pulmonary ROS,    Pulmonary exam normal breath sounds clear to auscultation       Cardiovascular hypertension, + CAD (s/p stent on Plavix) and +CHF  Normal cardiovascular exam+ dysrhythmias (a fib)  Rhythm:Regular Rate:Normal  Stable pericardial effusion  ECG 11/25/20: SR, probable anteroseptal infarct, NSST abnormality in lateral leads  Echo 06/11/20:  NORMAL LEFT VENTRICULAR SYSTOLIC FUNCTION WITH MODERATE LVH  ELEVATED LA PRESSURES WITH DIASTOLIC DYSFUNCTION  NORMAL RIGHT VENTRICULAR SYSTOLIC FUNCTION  VALVULAR REGURGITATION: TRIVIAL MR, TRIVIAL PR, TRIVIAL TR  NO VALVULAR STENOSIS  SMALL PERICARDIAL EFFUSION (See above)  SMALL PERICARDIAL EFFUSION WITH SOME RESPIRATORY FLOW VARIATION BUT NORMAL  IVC.  NO PRIOR STUDY FOR COMPARISON.     Neuro/Psych PSYCHIATRIC DISORDERS Anxiety  Neuromuscular disease (diabetic neuropathy)    GI/Hepatic GERD  ,  Endo/Other  diabetes, Type 2  Renal/GU ESRF and DialysisRenal disease (last HD 11/26/20; nephrolithiasis)     Musculoskeletal Right finger gangrene   Abdominal   Peds  Hematology  (+) Blood dyscrasia (AOCD), anemia ,   Anesthesia Other Findings   Reproductive/Obstetrics                            Anesthesia Physical Anesthesia Plan  ASA: 4  Anesthesia Plan: General   Post-op Pain Management:    Induction: Intravenous  PONV Risk Score and Plan: 2 and Ondansetron, Dexamethasone and Treatment may vary due to age or medical condition  Airway  Management Planned: Natural Airway  Additional Equipment:   Intra-op Plan:   Post-operative Plan:   Informed Consent: I have reviewed the patients History and Physical, chart, labs and discussed the procedure including the risks, benefits and alternatives for the proposed anesthesia with the patient or authorized representative who has indicated his/her understanding and acceptance.       Plan Discussed with: CRNA  Anesthesia Plan Comments: (LMA/GETA backup discussed.)      Anesthesia Quick Evaluation

## 2020-11-27 NOTE — Discharge Summary (Signed)
Physician Discharge Summary  Patient ID: [name]. MRN: [id number] DOB/AGE: Oct 22, 1970 50 y.o.  Admit date: 11/24/2020 Discharge date: 11/27/2020  Admission Diagnoses:  Discharge Diagnoses:  Principal Problem:   Syncope Active Problems:   Sepsis ([facility])   Essential hypertension, benign   Type II diabetes mellitus (HCC)   Chronic diastolic heart failure (HCC)   Chronic anemia   History of ST elevation myocardial infarction (STEMI) 02/2020 s/p DES to LAD   ESRD (end stage renal disease) (HCC)   Elevated troponin   Left leg cellulitis   Frequent falls   Finger infection, fourth left   AF (paroxysmal atrial fibrillation) (HCC)   Hypotension   NSTEMI (non-ST elevated myocardial infarction) (HCC)   Gangrene of finger of right hand ([place])   Discharged Condition: fair  Hospital Course:  50 year old man with end-stage renal disease chronic diastolic congestive heart failure hyperlipidemia, CAD presented with syncopal episode and found to be in rapid atrial fibrillation.  This converted over to normal sinus rhythm.  Patient has noticed ischemia of his right fourth finger going on for few weeks.  Also on antibiotics as outpatient for left thigh infection.  #1.  Non-STEMI. Paroxysmal atrial fibrillation. Nonsustained ventricular tachycardia. Chronic diastolic congestive heart failure. Syncope.   Patient is a followed by cardiology, peak troponin has increased to 1558.  He is a placed on heparin drip, beta-blocker and statin.  Cardiology offered patient a work-up including stress test, patient refused to stay in the hospital for work-up, he will be seen by cardiology as outpatient. Patient also had 15 beats of ventricular tachycardia while in the hospital, this could be the cause of patient's syncope.  I spoke with Dr. Rockey Situ, as patient refused to stay in the hospital, and ZIO monitor will be set up by cardiology. Discharge, patient will be on aspirin, Plavix and Eliquis. Currently  has no evidence of volume overload, he has been dialyzed today.   2.  Right fourth finger ischemia. Patient had angiogram today, planning to banding the AV fistula to reduce the flow, so that blood flow to the fingers can be increased.  Spoke with nephrology, he was offered to have dialysis today to test the AV fistula, patient refused.  3.  End-stage renal disease. Anemia of chronic kidney disease. Followed by nephrology.  Right groin infection. Continue finish antibiotic prescribed by his primary care physician.  Follow-up with PCP as outpatient.   Consults: cardiology, neurology, and vascular surgery  Significant Diagnostic Studies:   Treatments: IV heparin, vascular procedure  Discharge Exam: Blood pressure (!) 175/75, pulse (!) 57, temperature 98.2 F (36.8 C), resp. rate 17, height '6\' 3"'$  (1.905 m), weight 92.5 kg, SpO2 98 %. General appearance: alert and cooperative Resp: clear to auscultation bilaterally Cardio: regular rate and rhythm, S1, S2 normal, no murmur, click, rub or gallop GI: soft, non-tender; bowel sounds normal; no masses,  no organomegaly Extremities: extremities normal, atraumatic, no cyanosis or edema  Disposition: Discharge disposition: 01-Home or Self Care       Discharge Instructions     Diet general   Complete by: As directed    Renal diet   Discharge wound care:   Complete by: As directed    Follow at HD   Increase activity slowly   Complete by: As directed       Allergies as of 11/27/2020       Reactions   Levofloxacin Swelling   Of face Other reaction(s): swelling   Promethazine Diarrhea, Other (See Comments)  Other reaction(s): Other (See Comments), Other (See Comments) Other reaction(s): Muscle cramps Muscle cramps Cramping Other reaction(s): Muscle cramps Muscle cramps   Other    Other reaction(s): Unknown   Malt Other (See Comments)   Other reaction(s): cramping        Medication List     STOP taking these  medications    omeprazole 40 MG capsule Commonly known as: PRILOSEC       TAKE these medications    amiodarone 200 MG tablet Commonly known as: Pacerone Take 200 mg twice a day for 1 week and then start taking 200 mg once a day   amoxicillin 500 MG capsule Commonly known as: AMOXIL Take 500 mg by mouth daily as needed.   apixaban 5 MG Tabs tablet Commonly known as: ELIQUIS Take 1 tablet (5 mg total) by mouth 2 (two) times daily.   Aspirin Low Dose 81 MG chewable tablet Generic drug: aspirin CHEW 1 TABLET (81 MG TOTAL) BY MOUTH DAILY.   atorvastatin 80 MG tablet Commonly known as: LIPITOR TAKE 1 TABLET (80 MG TOTAL) BY MOUTH AT BEDTIME.   azelastine 0.1 % nasal spray Commonly known as: ASTELIN Place 2 sprays into both nostrils 2 (two) times daily.   calcium acetate 667 MG capsule Commonly known as: PHOSLO Take 3 capsules (2,001 mg total) by mouth 3 (three) times daily with meals.   carvedilol 25 MG tablet Commonly known as: COREG Take 25 mg by mouth every morning.   clopidogrel 75 MG tablet Commonly known as: PLAVIX START PLAVIX AND TAKE 300 MG ON 2/7, THEN ON 2/8 TAKE PLAVIX 75 MG DAILY. What changed:  how much to take how to take this when to take this   dorzolamide-timolol 22.3-6.8 MG/ML ophthalmic solution Commonly known as: COSOPT Place 1 drop into the right eye 2 (two) times daily.   doxycycline 100 MG capsule Commonly known as: VIBRAMYCIN Take 1 capsule (100 mg total) by mouth 2 (two) times daily.   FeroSul 325 (65 FE) MG tablet Generic drug: ferrous sulfate Take 325 mg by mouth daily.   fluticasone 50 MCG/ACT nasal spray Commonly known as: FLONASE Place 2 sprays into both nostrils daily.   lidocaine-prilocaine cream Commonly known as: EMLA Apply 1 application topically Every Tuesday,Thursday,and Saturday with dialysis.   losartan 25 MG tablet Commonly known as: COZAAR Take 25 mg by mouth daily.   nitroGLYCERIN 0.4 MG SL tablet Commonly  known as: NITROSTAT PLACE 1 TABLET (0.4 MG TOTAL) UNDER THE TONGUE EVERY FIVE MINUTES AS NEEDED. What changed:  how much to take how to take this when to take this reasons to take this   pantoprazole 40 MG tablet Commonly known as: Protonix Take 1 tablet (40 mg total) by mouth daily.   prednisoLONE acetate 1 % ophthalmic suspension Commonly known as: PRED FORTE Place 1 drop into the left eye every 4 (four) hours.   tamsulosin 0.4 MG Caps capsule Commonly known as: FLOMAX Take 0.4 mg by mouth daily after breakfast.               Discharge Care Instructions  (From admission, onward)           Start     Ordered   11/27/20 0000  Discharge wound care:       Comments: Follow at HD   11/27/20 1458            Follow-up Information     [name], MD Follow up in 1 week(s).  Specialty: Family Medicine Contact information: [address] [place] [address] [phone]         Jettie Booze, MD Follow up in 1 week(s).   Specialties: Cardiology, Radiology, Interventional Cardiology Contact information: [id number] [place] [address] [phone]                35 minutes Signed: Sharen Hones 11/27/2020, 2:58 PM

## 2020-11-27 NOTE — Plan of Care (Signed)

## 2020-11-27 NOTE — Progress Notes (Signed)
Progress Note  Patient Name: [name]. Date of Encounter: 11/27/2020  Primary Cardiologist: Larae Grooms, MD  Subjective   Feels well this AM w/o chest pain or dyspnea.  R 4th finger pain.  Noted an episode of tachypalpitations last night while getting up to go to the bathroom.  He did have a 15 beat run of NSVT @ 00:45 - unclear if this coincides w/ his symptoms.  He did expound on symptoms of tachypalpitations and syncope that occurred previously at home and while driving.  These typically occur after dialysis and on at least 2 occasions, he has had to pull over to the side of the road secondary to presyncope and palpitations followed by a brief episode of syncope.  Inpatient Medications    Scheduled Meds:  amiodarone  200 mg Oral BID   aspirin EC  81 mg Oral Daily   atorvastatin  80 mg Oral QHS   calcium acetate  2,001 mg Oral TID WC   carvedilol  12.5 mg Oral BID WC   Chlorhexidine Gluconate Cloth  6 each Topical Q0600   clopidogrel  75 mg Oral Daily   dorzolamide-timolol  1 drop Right Eye BID   Continuous Infusions:   ceFAZolin (ANCEF) IV     cefTRIAXone (ROCEPHIN)  IV 1 g (11/27/20 0554)   PRN Meds: acetaminophen, hydrALAZINE, nitroGLYCERIN, ondansetron (ZOFRAN) IV   Vital Signs    Vitals:   11/26/20 1956 11/27/20 0009 11/27/20 0430 11/27/20 0841  BP: (!) 171/67 (!) 156/64 (!) 164/72 (!) 174/80  Pulse: 70 72 68 66  Resp: (!) 22 (!) 22 (!) 22 20  Temp: 98.4 F (36.9 C) 98.7 F (37.1 C) 99.7 F (37.6 C) 98.5 F (36.9 C)  TempSrc: Oral   Oral  SpO2: 100% 97% 97% 99%  Weight:      Height:        Intake/Output Summary (Last 24 hours) at 11/27/2020 0946 Last data filed at 11/26/2020 1800 Gross per 24 hour  Intake 717.08 ml  Output 0 ml  Net 717.08 ml   Filed Weights   11/26/20 0755 11/26/20 1315 11/26/20 1759  Weight: 90.7 kg (P) 91.5 kg 92.5 kg    Physical Exam   GEN: Well nourished, well developed, in no acute distress.  HEENT: Grossly  normal.  Neck: Supple, no JVD, carotid bruits, or masses. Cardiac: RRR, no murmurs, rubs, or gallops. No clubbing, cyanosis, edema.  Radials 2+, DP/PT 2+ and equal bilaterally.  Right upper extremity AV fistula with bruit and thrill.  Right fourth finger with distal discoloration. Respiratory:  Respirations regular and unlabored, clear to auscultation bilaterally. GI: Soft, nontender, nondistended, BS + x 4. MS: no deformity or atrophy. Skin: warm and dry, no rash. Neuro:  Strength and sensation are intact. Psych: AAOx3.  Normal affect.  Labs    Chemistry Recent Labs  Lab 11/25/20 0523 11/26/20 0257 11/27/20 0534  NA 141 141 137  K 4.0 4.5 3.6  CL 112* 113* 102  CO2 17* 17* 25  GLUCOSE 88 74 86  BUN 58* 69* 36*  CREATININE 13.89* 14.92* 9.75*  CALCIUM 7.7* 7.6* 7.8*  GFRNONAA 4* 4* 6*  ANIONGAP '12 11 10     '$ Hematology Recent Labs  Lab 11/25/20 0523 11/26/20 0257 11/27/20 0534  WBC 9.1 10.0 9.5  RBC 3.56* 3.45* 3.52*  HGB 8.4* 8.0* 8.2*  HCT 26.3* 26.3* 26.4*  MCV 73.9* 76.2* 75.0*  MCH 23.6* 23.2* 23.3*  MCHC 31.9 30.4 31.1  RDW  19.7* 19.9* 19.4*  PLT 222 197 194    Cardiac Enzymes  Recent Labs  Lab 11/24/20 1652 11/24/20 2210 11/25/20 0749 11/25/20 1547 11/26/20 1044  TROPONINIHS 104* 731* 1,556* 1,422* 913*     Lipids  Lab Results  Component Value Date   CHOL 122 11/26/2020   HDL 45 11/26/2020   LDLCALC 61 11/26/2020   TRIG 78 11/26/2020   CHOLHDL 2.7 11/26/2020    HbA1c  Lab Results  Component Value Date   HGBA1C 5.2 11/24/2020    Radiology    CT HEAD WO CONTRAST (5MM)  Result Date: 11/24/2020 CLINICAL DATA:  Mental status change, unknown cause; Neck trauma, impaired ROM (Age 36-64y) EXAM: CT HEAD WITHOUT CONTRAST CT CERVICAL SPINE WITHOUT CONTRAST TECHNIQUE: Multidetector CT imaging of the head and cervical spine was performed following the standard protocol without intravenous contrast. Multiplanar CT image reconstructions of the  cervical spine were also generated. COMPARISON:  07/18/2017 FINDINGS: CT HEAD FINDINGS Brain: No evidence of acute infarction, hemorrhage, hydrocephalus, extra-axial collection or mass lesion/mass effect. Vascular: Atherosclerotic calcifications involving the large vessels of the skull base. No unexpected hyperdense vessel. Skull: Normal. Negative for fracture or focal lesion. Sinuses/Orbits: No acute finding. Other: None. CT CERVICAL SPINE FINDINGS Alignment: Facet joints are aligned without dislocation or traumatic listhesis. Dens and lateral masses are aligned. Straightening of the cervical lordosis. Skull base and vertebrae: No acute fracture. No primary bone lesion or focal pathologic process. Soft tissues and spinal canal: No prevertebral fluid or swelling. No visible canal hematoma. 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 Minimal facet arthropathy bilaterally. Upper chest: Included lung apices are clear. Other: None. IMPRESSION: 1. No acute intracranial findings. 2. No acute fracture or traumatic listhesis of the cervical spine. 3. 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 Electronically Signed   By: Davina Poke D.O.   On: 11/24/2020 21:29   CT Cervical Spine Wo Contrast  Result Date: 11/24/2020 CLINICAL DATA:  Mental status change, unknown cause; Neck trauma, impaired ROM (Age 21-64y) EXAM: CT HEAD WITHOUT CONTRAST CT CERVICAL SPINE WITHOUT CONTRAST TECHNIQUE: Multidetector CT imaging of the head and cervical spine was performed following the standard protocol without intravenous contrast. Multiplanar CT image reconstructions of the cervical spine were also generated. COMPARISON:  07/18/2017 FINDINGS: CT HEAD FINDINGS Brain: No evidence of acute infarction, hemorrhage, hydrocephalus, extra-axial collection or mass lesion/mass effect. Vascular: Atherosclerotic calcifications involving the large vessels of the skull base. No unexpected hyperdense vessel. Skull: Normal. Negative for fracture or focal  lesion. Sinuses/Orbits: No acute finding. Other: None. CT CERVICAL SPINE FINDINGS Alignment: Facet joints are aligned without dislocation or traumatic listhesis. Dens and lateral masses are aligned. Straightening of the cervical lordosis. Skull base and vertebrae: No acute fracture. No primary bone lesion or focal pathologic process. Soft tissues and spinal canal: No prevertebral fluid or swelling. No visible canal hematoma. 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 Minimal facet arthropathy bilaterally. Upper chest: Included lung apices are clear. Other: None. IMPRESSION: 1. No acute intracranial findings. 2. No acute fracture or traumatic listhesis of the cervical spine. 3. 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 Electronically Signed   By: Davina Poke D.O.   On: 11/24/2020 21:29   PERIPHERAL VASCULAR CATHETERIZATION  Result Date: 11/26/2020 See surgical note for result.  DG Chest Port 1 View  Result Date: 11/24/2020 CLINICAL DATA:  Weakness EXAM: PORTABLE CHEST 1 VIEW COMPARISON:  11/07/2020, 06/04/2019, 02/26/2020 FINDINGS: Cardiomegaly without edema, pleural effusion, focal opacity or pneumothorax. IMPRESSION: No active disease. Similar  cardiomegaly since January 2022, increased compared to radiographs prior to this. Findings could be due to multi chamber enlargement or pericardial effusion. Electronically Signed   By: Donavan Foil M.D.   On: 11/24/2020 21:15   DG Finger Ring Right  Result Date: 11/24/2020 CLINICAL DATA:  Discoloration of ring finger EXAM: RIGHT RING FINGER 2+V COMPARISON:  None. FINDINGS: No fracture or malalignment. Vascular calcifications. Possible ulcer at the tip of the fourth digit. Possible small amount of air under the nail bed. IMPRESSION: No acute osseous abnormality. Possible soft tissue ulcer at the tip of the fourth digit Electronically Signed   By: Donavan Foil M.D.   On: 11/24/2020 21:16    Telemetry    Sinus rhythm with 15 beats of nonsustained VT at  12:45 AM- Personally Reviewed  Cardiac Studies   Cardiac Catheterization/Ant STEMI  1.4.2022  Diagnostic Dominance: Right Intervention  Implants      2D Echocardiogram 11/25/2020  1. Left ventricular ejection fraction, by estimation, is 55 to 60%. The  left ventricle has normal function. The left ventricle has no regional  wall motion abnormalities. The left ventricular internal cavity size was  borderline dilated. There is moderate   left ventricular hypertrophy. Left ventricular diastolic parameters are  consistent with Grade II diastolic dysfunction (pseudonormalization).  Elevated left atrial pressure. The average left ventricular global  longitudinal strain is -13.0 %. The global  longitudinal strain is abnormal.   2. Right ventricular systolic function is normal. The right ventricular  size is mildly enlarged. Tricuspid regurgitation signal is inadequate for  assessing PA pressure.   3. Left atrial size was mild to moderately dilated.   4. Right atrial size was mildly dilated.   5. There is a small to moderate pericardial effusion. There is no  evidence of cardiac tamponade.   6. The mitral valve is normal in structure. Trivial mitral valve  regurgitation. No evidence of mitral stenosis.   7. The aortic valve is tricuspid. Aortic valve regurgitation is not  visualized. No aortic stenosis is present.   8. Aortic dilatation noted. There is borderline dilatation of the aortic  root, measuring 38 mm.   9. Mildly dilated pulmonary artery.  10. The inferior vena cava is dilated in size with >50% respiratory  variability, suggesting right atrial pressure of 8 mmHg.   Patient Profile     50 y.o. male with history of paroxysmal atrial fibrillation, CAD/acute MI s/p DES to LAD, end-stage renal disease on HD presenting with presyncopal symptoms, found to have gangrenous fourth finger of his right arm, being seen for elevated troponins, NSVT, and A. fib RVR.    Assessment & Plan     1.  Paroxysmal atrial fibrillation: Maintaining sinus rhythm on amiodarone therapy, which was initiated September 16 following presentation to [facility] with A. fib and RVR.  He is also on carvedilol 12.5 mg twice daily.  Carvedilol dose reduced September 23, secondary to bradycardia noted at dialysis.  Heart rates have been in the mid 60s to 70s here.  In the setting of nonsustained VT overnight, increasing amiodarone to 200 mg twice daily.  He is not currently on oral anticoagulation but we would recommend initiation of Eliquis once vascular procedures are completed.  2.  Demand ischemia/coronary artery disease: Status post prior anterior STEMI with LAD stenting in January 2022.  In the setting of rapid atrial fibrillation this admission, his troponin rose to 1556.  He denies experiencing chest pain or dyspnea prior to admission,  with his primary complaint at the time being palpitations and presyncope.  Echo shows EF of 55 to 60% without regional wall motion abnormalities.  In the setting of nonsustained VT, we have recommended an inpatient ischemic evaluation with [facility] prior to discharge.  At this time, patient wishes to defer to the outpatient setting as he is hoping for discharge following vascular procedure today.  If he ends up staying overnight, we will try and get a Lexiscan Myoview done prior to discharge tomorrow.  Otherwise, we will arrange as an outpatient.  He is currently on low-dose aspirin, statin, beta-blocker, and Plavix.  We will plan to discontinue aspirin once Eliquis initiated.  3.  Nonsustained VT: Patient with 15 beats of nonsustained VT early this morning (00:4 5).  It is not clear if he was symptomatic or not, though he did report tachypalpitations when he got up to go to the bathroom at some point in the night.  Potassium 3.6, magnesium 1.8.  On further questioning, he reports episodic tachypalpitations associated with presyncope and on at least 2 occasions,  syncope.  Symptoms are most likely to occur while driving home following a dialysis session.  He notes a history of hypotension during dialysis as well.  In the setting of nonsustained VT, we have increased amiodarone to 2 mg twice daily.  We have also strongly encouraged ischemic testing while he is an inpatient as above, he would like to defer to the outpatient setting.  Finally, we have advised him that he is not to drive.  We will have a 14-day Zio monitor placed at the time of discharge to further evaluate VT burden and linked to symptoms.  4.  Chronic heart failure with preserved ejection fraction: Patient notes stable dry weight and denies dyspnea or edema.  Euvolemic on exam.  Volume management per nephrology/HD.  5.  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  Concern regarding steal from his AV fistula with plan for banding today.  6.  Pericardial effusion: Small to moderate effusion noted on echo this admission which dates back to at least 2018.  Hemodynamically stable.  7.  End-stage renal disease: Hemodialysis per nephrology.  Patient notes frequent symptomatic hypotension towards the end of dialysis sessions.  Question potential need for the addition of midodrine, though his blood pressures have been trending in the 150s to 170s here.  8.  Essential hypertension: As above, blood pressures trending high here though he has a history of hypotension on dialysis days.  With history of hypotension and syncope, will hold off on any further titration of his carvedilol or addition of alternate blood pressure medications.  9.  Hyperlipidemia: Continue atorvastatin therapy.  LDL 61.  Signed, Murray Hodgkins, NP  11/27/2020, 9:46 AM    For questions or updates, please contact   Please consult [email or web address] for contact info under Cardiology/STEMI.

## 2020-11-27 NOTE — Transfer of Care (Signed)
Immediate Anesthesia Transfer of Care Note  Patient: [name].  Procedure(s) Performed: BANDING HERO GRAFT (Right: Arm Upper)  Patient Location: PACU  Anesthesia Type:General  Level of Consciousness: drowsy  Airway & Oxygen Therapy: Patient Spontanous Breathing  Post-op Assessment: Report given to RN and Post -op Vital signs reviewed and stable  Post vital signs: Reviewed and stable  Last Vitals:  Vitals Value Taken Time  BP 112/61 11/27/20 1331  Temp    Pulse 58 11/27/20 1333  Resp 12 11/27/20 1333  SpO2 100 % 11/27/20 1333  Vitals shown include unvalidated device data.  Last Pain:  Vitals:   11/27/20 1143  TempSrc: Temporal  PainSc: 8       Patients Stated Pain Goal: 0 ([id number])  Complications: No notable events documented.

## 2020-11-27 NOTE — Progress Notes (Signed)
Thirty day free trial offer for Eliquis sent home with patient.  Pt verbalized understanding.

## 2020-11-27 NOTE — H&P (Signed)
[facility] History & Physical Update  The patient was interviewed and re-examined.  The patient's previous History and Physical has been reviewed and is unchanged.  There is no change in the plan of care. We plan to proceed with the scheduled procedure.  [name], MD  11/27/2020, 12:31 PM

## 2020-11-29 LAB — CULTURE, BLOOD (SINGLE)
Culture: NO GROWTH
Special Requests: ADEQUATE

## 2020-12-02 ENCOUNTER — Telehealth: Payer: Self-pay

## 2020-12-02 NOTE — Telephone Encounter (Signed)
   [facility] Pre-operative Risk Assessment    Patient Name: [name].  DOB: 1970-07-17 MRN: [id number]  HEARTCARE STAFF:  - IMPORTANT!!!!!! Under Visit Info/Reason for Call, type in Other and utilize the format Clearance MM/DD/YY or Clearance TBD. Do not use dashes or single digits. - Please review there is not already an duplicate clearance open for this procedure. - If request is for dental extraction, please clarify the # of teeth to be extracted. - If the patient is currently at the dentist's office, call Pre-Op Callback Staff (MA/nurse) to input urgent request.  - If the patient is not currently in the dentist office, please route to the Pre-Op pool.  Request for surgical clearance:  What type of surgery is being performed? Right ring finger amputation  When is this surgery scheduled? 12/05/20  What type of clearance is required (medical clearance vs. Pharmacy clearance to hold med vs. Both)? Both  Are there any medications that need to be held prior to surgery and how long? Plavix 5 days preop  Practice name and name of physician performing surgery? [facility]- Dr. Peggye Ley  What is the office phone number? [phone]   7.   What is the office fax number? [phone]  8.   Anesthesia type (None, local, MAC, general) ?    Lowella Grip 12/02/2020, 3:01 PM  _________________________________________________________________   (provider comments below)

## 2020-12-03 NOTE — Telephone Encounter (Signed)
Patient is a 50 year old male with past medical history of uncontrolled diabetes mellitus, ESRD on HD awaiting organ transplant, hypertension, hyperlipidemia, gastroparesis, CAD and chronic diastolic heart failure.  Last PCI was on 02/26/2020 with DES to mid LAD.  He was recently admitted to the hospital with atrial fibrillation with RVR and elevated troponin.  Hospitalization complicated by episode of nonsustained VT.  Patient was discharged on a heart monitor.  Elevation of the troponin was felt to have a component of demand ischemia.  Myoview was recommended during the hospitalization which the patient deferred to outpatient work-up at a later date.  He was started on Eliquis at the time of discharge.  Currently he is on triple therapy include aspirin, Plavix and Eliquis.  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  And will require surgical amputation of the right ring finger due to gangrene.  Talking with the patient, he continued to deny any chest pain or worsening dyspnea.  He says he has not taken the Eliquis or the Plavix in the past 2 days (since Monday 12/01/2020) yet anticipation of upcoming surgery.  Will discuss with Dr. Irish Lack regarding this very complicated case.  Either way, with finger gangrene, I believe the surgery is considered urgent.

## 2020-12-03 NOTE — Telephone Encounter (Signed)
    Patient Name: [name].  DOB: Nov 14, 1970 MRN: [id number]  Primary Cardiologist: Larae Grooms, MD  Chart reviewed as part of pre-operative protocol coverage. Given past medical history and time since last visit, based on ACC/AHA guidelines, [name]. would be at acceptable risk for the planned procedure without further cardiovascular testing.   I discussed this case with Dr. Irish Lack, patient has already held both Eliquis and Plavix for the past 2 days.  From our perspective, he can proceed with finger amputation without further cardiac work-up.  He will need to restart Eliquis and Plavix after the procedure as soon as possible at the surgeon's discretion.  Once he restart Eliquis and Plavix, he can stop the aspirin.  This way he will only on 2 blood thinners instead of triple therapy.  The patient was advised that if he develops new symptoms prior to surgery to contact our office to arrange for a follow-up visit, and he verbalized understanding.  I will route this recommendation to the requesting party via Epic fax function and remove from pre-op pool.  Please call with questions.  [place], [place] 12/03/2020, 4:33 PM

## 2020-12-08 ENCOUNTER — Emergency Department (HOSPITAL_COMMUNITY): Payer: Medicare Other

## 2020-12-08 ENCOUNTER — Observation Stay (HOSPITAL_COMMUNITY)
Admission: EM | Admit: 2020-12-08 | Discharge: 2020-12-09 | Disposition: A | Discharge status: Home / Self Care | Payer: Medicare Other | Attending: Family Medicine | Admitting: Family Medicine

## 2020-12-08 ENCOUNTER — Encounter (HOSPITAL_COMMUNITY): Payer: Self-pay | Admitting: Emergency Medicine

## 2020-12-08 ENCOUNTER — Other Ambulatory Visit: Payer: Self-pay

## 2020-12-08 DIAGNOSIS — I10 Essential (primary) hypertension: Secondary | ICD-10-CM | POA: Diagnosis present

## 2020-12-08 DIAGNOSIS — I509 Heart failure, unspecified: Secondary | ICD-10-CM | POA: Insufficient documentation

## 2020-12-08 DIAGNOSIS — R531 Weakness: Secondary | ICD-10-CM | POA: Diagnosis present

## 2020-12-08 DIAGNOSIS — E872 Acidosis, unspecified: Secondary | ICD-10-CM | POA: Diagnosis present

## 2020-12-08 DIAGNOSIS — E782 Mixed hyperlipidemia: Secondary | ICD-10-CM | POA: Diagnosis present

## 2020-12-08 DIAGNOSIS — I4891 Unspecified atrial fibrillation: Secondary | ICD-10-CM | POA: Diagnosis not present

## 2020-12-08 DIAGNOSIS — E1165 Type 2 diabetes mellitus with hyperglycemia: Secondary | ICD-10-CM | POA: Diagnosis present

## 2020-12-08 DIAGNOSIS — Z794 Long term (current) use of insulin: Secondary | ICD-10-CM

## 2020-12-08 DIAGNOSIS — I129 Hypertensive chronic kidney disease with stage 1 through stage 4 chronic kidney disease, or unspecified chronic kidney disease: Secondary | ICD-10-CM | POA: Diagnosis present

## 2020-12-08 DIAGNOSIS — Z7982 Long term (current) use of aspirin: Secondary | ICD-10-CM | POA: Insufficient documentation

## 2020-12-08 DIAGNOSIS — Z992 Dependence on renal dialysis: Secondary | ICD-10-CM | POA: Insufficient documentation

## 2020-12-08 DIAGNOSIS — S68119D Complete traumatic metacarpophalangeal amputation of unspecified finger, subsequent encounter: Secondary | ICD-10-CM

## 2020-12-08 DIAGNOSIS — Z20822 Contact with and (suspected) exposure to covid-19: Secondary | ICD-10-CM | POA: Insufficient documentation

## 2020-12-08 DIAGNOSIS — I252 Old myocardial infarction: Secondary | ICD-10-CM | POA: Diagnosis not present

## 2020-12-08 DIAGNOSIS — E1122 Type 2 diabetes mellitus with diabetic chronic kidney disease: Secondary | ICD-10-CM | POA: Insufficient documentation

## 2020-12-08 DIAGNOSIS — N186 End stage renal disease: Secondary | ICD-10-CM | POA: Insufficient documentation

## 2020-12-08 DIAGNOSIS — R079 Chest pain, unspecified: Secondary | ICD-10-CM

## 2020-12-08 DIAGNOSIS — R296 Repeated falls: Secondary | ICD-10-CM | POA: Diagnosis not present

## 2020-12-08 DIAGNOSIS — I132 Hypertensive heart and chronic kidney disease with heart failure and with stage 5 chronic kidney disease, or end stage renal disease: Secondary | ICD-10-CM | POA: Diagnosis not present

## 2020-12-08 DIAGNOSIS — S68119A Complete traumatic metacarpophalangeal amputation of unspecified finger, initial encounter: Secondary | ICD-10-CM | POA: Diagnosis present

## 2020-12-08 DIAGNOSIS — Z79899 Other long term (current) drug therapy: Secondary | ICD-10-CM | POA: Insufficient documentation

## 2020-12-08 DIAGNOSIS — I480 Paroxysmal atrial fibrillation: Principal | ICD-10-CM | POA: Diagnosis present

## 2020-12-08 DIAGNOSIS — E11319 Type 2 diabetes mellitus with unspecified diabetic retinopathy without macular edema: Secondary | ICD-10-CM | POA: Diagnosis present

## 2020-12-08 DIAGNOSIS — N2581 Secondary hyperparathyroidism of renal origin: Secondary | ICD-10-CM | POA: Diagnosis present

## 2020-12-08 LAB — CBC WITH DIFFERENTIAL/PLATELET
Abs Immature Granulocytes: 0.22 10*3/uL — ABNORMAL HIGH (ref 0.00–0.07)
Basophils Absolute: 0.1 10*3/uL (ref 0.0–0.1)
Basophils Relative: 1 %
Eosinophils Absolute: 0.3 10*3/uL (ref 0.0–0.5)
Eosinophils Relative: 3 %
HCT: 28 % — ABNORMAL LOW (ref 39.0–52.0)
Hemoglobin: 8.8 g/dL — ABNORMAL LOW (ref 13.0–17.0)
Immature Granulocytes: 2 %
Lymphocytes Relative: 12 %
Lymphs Abs: 1.1 10*3/uL (ref 0.7–4.0)
MCH: 24.1 pg — ABNORMAL LOW (ref 26.0–34.0)
MCHC: 31.4 g/dL (ref 30.0–36.0)
MCV: 76.7 fL — ABNORMAL LOW (ref 80.0–100.0)
Monocytes Absolute: 0.8 10*3/uL (ref 0.1–1.0)
Monocytes Relative: 9 %
Neutro Abs: 6.8 10*3/uL (ref 1.7–7.7)
Neutrophils Relative %: 73 %
Platelets: 193 10*3/uL (ref 150–400)
RBC: 3.65 MIL/uL — ABNORMAL LOW (ref 4.22–5.81)
RDW: 20.3 % — ABNORMAL HIGH (ref 11.5–15.5)
WBC: 9.3 10*3/uL (ref 4.0–10.5)
nRBC: 6.5 % — ABNORMAL HIGH (ref 0.0–0.2)

## 2020-12-08 LAB — COMPREHENSIVE METABOLIC PANEL
ALT: 5 U/L (ref 0–44)
AST: 12 U/L — ABNORMAL LOW (ref 15–41)
Albumin: 3 g/dL — ABNORMAL LOW (ref 3.5–5.0)
Alkaline Phosphatase: 75 U/L (ref 38–126)
Anion gap: 14 (ref 5–15)
BUN: 37 mg/dL — ABNORMAL HIGH (ref 6–20)
CO2: 16 mmol/L — ABNORMAL LOW (ref 22–32)
Calcium: 8.2 mg/dL — ABNORMAL LOW (ref 8.9–10.3)
Chloride: 107 mmol/L (ref 98–111)
Creatinine, Ser: 9.72 mg/dL — ABNORMAL HIGH (ref 0.61–1.24)
GFR, Estimated: 6 mL/min — ABNORMAL LOW (ref >60)
Glucose, Bld: 68 mg/dL — ABNORMAL LOW (ref 70–99)
Potassium: 3.2 mmol/L — ABNORMAL LOW (ref 3.5–5.1)
Sodium: 137 mmol/L (ref 135–145)
Total Bilirubin: 0.8 mg/dL (ref 0.3–1.2)
Total Protein: 6.6 g/dL (ref 6.5–8.1)

## 2020-12-08 LAB — RESP PANEL BY RT-PCR (FLU A&B, COVID) ARPGX2
Influenza A by PCR: NEGATIVE
Influenza B by PCR: NEGATIVE
SARS Coronavirus 2 by RT PCR: NEGATIVE

## 2020-12-08 LAB — MRSA NEXT GEN BY PCR, NASAL: MRSA by PCR Next Gen: NOT DETECTED

## 2020-12-08 LAB — TSH: TSH: 6.354 u[IU]/mL — ABNORMAL HIGH (ref 0.350–4.500)

## 2020-12-08 LAB — TROPONIN I (HIGH SENSITIVITY)
Troponin I (High Sensitivity): 78 ng/L — ABNORMAL HIGH (ref <18)
Troponin I (High Sensitivity): 80 ng/L — ABNORMAL HIGH (ref <18)

## 2020-12-08 LAB — BRAIN NATRIURETIC PEPTIDE: B Natriuretic Peptide: 482 pg/mL — ABNORMAL HIGH (ref 0.0–100.0)

## 2020-12-08 LAB — GLUCOSE, CAPILLARY: Glucose-Capillary: 92 mg/dL (ref 70–99)

## 2020-12-08 MED ORDER — ATORVASTATIN CALCIUM 40 MG PO TABS
80.0000 mg | ORAL_TABLET | Freq: Every day | ORAL | Status: DC
  Administered 2020-12-08: 80 mg via ORAL
  Filled 2020-12-08: qty 2

## 2020-12-08 MED ORDER — NITROGLYCERIN 0.4 MG SL SUBL: 0.4000 mg | SUBLINGUAL_TABLET | SUBLINGUAL | Status: DC | PRN

## 2020-12-08 MED ORDER — DILTIAZEM HCL-DEXTROSE 125-5 MG/125ML-% IV SOLN (PREMIX)
5.0000 mg/h | INTRAVENOUS | Status: DC
  Administered 2020-12-08: 5 mg/h via INTRAVENOUS
  Filled 2020-12-08: qty 125

## 2020-12-08 MED ORDER — ONDANSETRON HCL 4 MG/2ML IJ SOLN: 4.0000 mg | Freq: Four times a day (QID) | INTRAMUSCULAR | Status: DC | PRN

## 2020-12-08 MED ORDER — SODIUM CHLORIDE 0.9 % IV BOLUS
500.0000 mL | Freq: Once | INTRAVENOUS | Status: AC
  Administered 2020-12-08: 500 mL via INTRAVENOUS

## 2020-12-08 MED ORDER — OXYCODONE HCL 5 MG PO TABS: 5.0000 mg | ORAL_TABLET | ORAL | Status: DC | PRN

## 2020-12-08 MED ORDER — AMIODARONE HCL 200 MG PO TABS
200.0000 mg | ORAL_TABLET | Freq: Every day | ORAL | Status: DC
  Administered 2020-12-08 – 2020-12-09 (×2): 200 mg via ORAL
  Filled 2020-12-08: qty 1

## 2020-12-08 MED ORDER — FERROUS SULFATE 325 (65 FE) MG PO TABS
325.0000 mg | ORAL_TABLET | Freq: Every day | ORAL | Status: DC
  Administered 2020-12-08 – 2020-12-09 (×2): 325 mg via ORAL
  Filled 2020-12-08 (×2): qty 1

## 2020-12-08 MED ORDER — DILTIAZEM HCL-DEXTROSE 125-5 MG/125ML-% IV SOLN (PREMIX)
5.0000 mg/h | INTRAVENOUS | Status: AC
  Administered 2020-12-08: 5 mg/h via INTRAVENOUS

## 2020-12-08 MED ORDER — ACETAMINOPHEN 650 MG RE SUPP: 650.0000 mg | Freq: Four times a day (QID) | RECTAL | Status: DC | PRN

## 2020-12-08 MED ORDER — CARVEDILOL 12.5 MG PO TABS
25.0000 mg | ORAL_TABLET | Freq: Two times a day (BID) | ORAL | Status: DC
  Administered 2020-12-08 – 2020-12-09 (×3): 25 mg via ORAL
  Filled 2020-12-08 (×3): qty 2

## 2020-12-08 MED ORDER — AZELASTINE HCL 0.1 % NA SOLN
2.0000 | Freq: Two times a day (BID) | NASAL | Status: DC | PRN
  Filled 2020-12-08: qty 30

## 2020-12-08 MED ORDER — CALCIUM ACETATE (PHOS BINDER) 667 MG PO CAPS
2001.0000 mg | ORAL_CAPSULE | Freq: Three times a day (TID) | ORAL | Status: DC
  Administered 2020-12-08 – 2020-12-09 (×3): 2001 mg via ORAL
  Filled 2020-12-08 (×3): qty 3

## 2020-12-08 MED ORDER — ONDANSETRON HCL 4 MG PO TABS: 4.0000 mg | ORAL_TABLET | Freq: Four times a day (QID) | ORAL | Status: DC | PRN

## 2020-12-08 MED ORDER — ACETAMINOPHEN 325 MG PO TABS
650.0000 mg | ORAL_TABLET | Freq: Four times a day (QID) | ORAL | Status: DC | PRN
  Administered 2020-12-09: 650 mg via ORAL
  Filled 2020-12-08: qty 2

## 2020-12-08 MED ORDER — APIXABAN 5 MG PO TABS
5.0000 mg | ORAL_TABLET | Freq: Two times a day (BID) | ORAL | Status: DC
  Administered 2020-12-08 – 2020-12-09 (×3): 5 mg via ORAL
  Filled 2020-12-08 (×3): qty 1

## 2020-12-08 MED ORDER — INSULIN ASPART 100 UNIT/ML IJ SOLN: 0.0000 [IU] | Freq: Three times a day (TID) | INTRAMUSCULAR | Status: DC

## 2020-12-08 MED ORDER — BISACODYL 5 MG PO TBEC: 5.0000 mg | DELAYED_RELEASE_TABLET | Freq: Every day | ORAL | Status: DC | PRN

## 2020-12-08 MED ORDER — ASPIRIN 81 MG PO CHEW: 81.0000 mg | CHEWABLE_TABLET | Freq: Every day | ORAL | Status: DC

## 2020-12-08 MED ORDER — TAMSULOSIN HCL 0.4 MG PO CAPS
0.4000 mg | ORAL_CAPSULE | Freq: Every day | ORAL | Status: DC
  Administered 2020-12-09: 0.4 mg via ORAL
  Filled 2020-12-08: qty 1

## 2020-12-08 MED ORDER — CLOPIDOGREL BISULFATE 75 MG PO TABS
75.0000 mg | ORAL_TABLET | Freq: Every day | ORAL | Status: DC
  Administered 2020-12-08 – 2020-12-09 (×2): 75 mg via ORAL
  Filled 2020-12-08 (×2): qty 1

## 2020-12-08 NOTE — Plan of Care (Signed)

## 2020-12-08 NOTE — H&P (Signed)
History and Physical  [place]. [id number] DOB: [birth year] DOA: 12/08/2020  PCP: [name], MD  Patient coming from: Home  Level of care: Stepdown  I have personally briefly reviewed patient's old medical records in [place]  Chief Complaint: Dizziness/A. fib  HPI: [name]. is a 50 y.o. male with medical history significant of diabetes, CHF, NSTEMI, recent finger potation, paroxysmal atrial fibrillation on anticoagulation, renal failure presents with generalized weakness. He has a history of history of recurrent syncope, presyncope, falls, and hypotension. Patient not cooperative during interview.  He states he cannot answer questions when he feels this way.  Patient did answer questions with yes or no nod. a patient endorses dizziness and fast heart rate.  He states that he has been feeling this way since his discharge from last admission. Patient does endorse a recent fall.  More information about the fall was requested patient declined to answer further; but he denies hitting his head or injuries from fall.  Patient currently denies chest pains or SOB.  Patient was recently hospitalized on 10/3 for NSTEMI and Paroxysmal atrial fibrillation. Unclear if patient has been compliant with his medication.  Patient also recently had a finger amputated.  Patient reportedly attended dialysis today.   ED Course: On arrival, patient tachycardic, A. fib with RVR.  Unclear if patient took his home medicine.  Blood pressure soft; fluid bolus given.  Patient was started on IV diltiazem 5 mg/hr. Based on patient presentation, IV diltiazem was titrated to 15 mg/hr. patient's heart rate still remains elevated despite titration.  Review of Systems: Review of Systems  Reason unable to perform ROS: Limited by patient cooperation.  Constitutional:  Negative for fever.  HENT: Negative.    Cardiovascular:  Positive for palpitations and leg swelling. Negative  for chest pain, orthopnea and claudication.  Gastrointestinal:  Negative for nausea and vomiting.  Musculoskeletal:  Positive for falls.  Skin: Negative.   Neurological:  Positive for dizziness.    Past Medical History:  Diagnosis Date   Anxiety    Cellulitis    CHF (congestive heart failure) (HCC)    Chronic diarrhea    Chronic kidney disease    Chronic pain of both ankles    Diabetes mellitus without complication (HCC)    Diabetes mellitus, type II (HCC)    Diabetic neuropathy (HCC)    Frequent falls    Gait instability    GERD (gastroesophageal reflux disease)    Heart palpitations    History of kidney stones    HLD (hyperlipidemia)    Hypertension    Insomnia    Nausea and vomiting in adult    recurrent    Past Surgical History:  Procedure Laterality Date   AMPUTATION Left 03/16/2016   Procedure: AMPUTATION DIGIT LEFT HALLUX;  Surgeon: Trula Slade, DPM;  Location: [facility];  Service: Podiatry;  Laterality: Left;  can start around 5    ARTHROSCOPIC REPAIR ACL Left    AV FISTULA PLACEMENT Right 05/31/2019   Procedure: Brachiocephalic AV fistula creation;  Surgeon: Algernon Huxley, MD;  Location: [name];  Service: Vascular;  Laterality: Right;   COLONOSCOPY WITH PROPOFOL N/A 10/28/2015   Procedure: COLONOSCOPY WITH PROPOFOL;  Surgeon: Lollie Sails, MD;  Location: [facility] ENDOSCOPY;  Service: Endoscopy;  Laterality: N/A;   COLONOSCOPY WITH PROPOFOL N/A 10/29/2015   Procedure: COLONOSCOPY WITH PROPOFOL;  Surgeon: Lollie Sails, MD;  Location: [facility] ENDOSCOPY;  Service: Endoscopy;  Laterality:  N/A;   CORONARY/GRAFT ACUTE MI REVASCULARIZATION N/A 02/26/2020   Procedure: Coronary/Graft Acute MI Revascularization;  Surgeon: Jettie Booze, MD;  Location: [place] CV LAB;  Service: Cardiovascular;  Laterality: N/A;   DIALYSIS/PERMA CATHETER INSERTION Right 04/26/2019   Perm Cath    DIALYSIS/PERMA CATHETER INSERTION N/A 04/26/2019   Procedure: DIALYSIS/PERMA CATHETER  INSERTION;  Surgeon: Algernon Huxley, MD;  Location: [place] CV LAB;  Service: Cardiovascular;  Laterality: N/A;   DIALYSIS/PERMA CATHETER REMOVAL N/A 08/15/2019   Procedure: DIALYSIS/PERMA CATHETER REMOVAL;  Surgeon: Katha Cabal, MD;  Location: [place] [facility] LAB;  Service: Cardiovascular;  Laterality: N/A;   ESOPHAGOGASTRODUODENOSCOPY (EGD) WITH PROPOFOL N/A 12/27/2017   Procedure: ESOPHAGOGASTRODUODENOSCOPY (EGD) WITH PROPOFOL;  Surgeon: [place], Benay Pike, MD;  Location: [name] ENDOSCOPY;  Service: Gastroenterology;  Laterality: N/A;   INTRAVASCULAR ULTRASOUND/IVUS N/A 02/26/2020   Procedure: Intravascular Ultrasound/IVUS;  Surgeon: Jettie Booze, MD;  Location: [place] [facility] LAB;  Service: Cardiovascular;  Laterality: N/A;   LEFT HEART CATH AND CORONARY ANGIOGRAPHY N/A 02/26/2020   Procedure: LEFT HEART CATH AND CORONARY ANGIOGRAPHY;  Surgeon: Jettie Booze, MD;  Location: [place] [facility];  Service: Cardiovascular;  Laterality: N/A;   PROSTATE SURGERY  2016   TONSILECTOMY/ADENOIDECTOMY WITH MYRINGOTOMY     TONSILLECTOMY     UPPER EXTREMITY ANGIOGRAPHY Right 11/26/2020   Procedure: Upper Extremity Angiography;  Surgeon: Algernon Huxley, MD;  Location: [place] CV LAB;  Service: Cardiovascular;  Laterality: Right;     reports that he has never smoked. He has never used smokeless tobacco. He reports that he does not drink alcohol and does not use drugs.  Allergies  Allergen Reactions   Levofloxacin Swelling    Of face Other reaction(s): swelling   Promethazine Diarrhea and Other (See Comments)    Other reaction(s): Other (See Comments), Other (See Comments) Other reaction(s): Muscle cramps Muscle cramps Cramping Other reaction(s): Muscle cramps Muscle cramps   Other     Other reaction(s): Unknown   Malt Other (See Comments)    Other reaction(s): cramping    Family History  Problem Relation Age of Onset   CAD Father    Stroke Father    Diabetes Mellitus  II Mother    Kidney failure Mother    Schizophrenia Mother     Prior to Admission medications   Medication Sig Start Date End Date Taking? Authorizing Provider  amiodarone (PACERONE) 200 MG tablet Take 200 mg twice a day for 1 week and then start taking 200 mg once a day Patient taking differently: Take 200 mg by mouth daily. Take 200 mg twice a day for 1 week and then start taking 200 mg once a day 11/07/20  Yes Milton Ferguson, MD  amoxicillin (AMOXIL) 500 MG capsule Take 500 mg by mouth daily as needed. 11/07/20  Yes [provider]  apixaban (ELIQUIS) 5 MG TABS tablet Take 1 tablet (5 mg total) by mouth 2 (two) times daily. 11/27/20  Yes Sharen Hones, MD  aspirin 81 MG chewable tablet CHEW 1 TABLET (81 MG TOTAL) BY MOUTH DAILY. 02/28/20 02/27/21 Yes Reino Bellis B, NP  atorvastatin (LIPITOR) 80 MG tablet TAKE 1 TABLET (80 MG TOTAL) BY MOUTH AT BEDTIME. 02/28/20 02/27/21 Yes Cheryln Manly, NP  azelastine (ASTELIN) 0.1 % nasal spray Place 2 sprays into both nostrils 2 (two) times daily. 09/08/20  Yes [provider]  calcium acetate (PHOSLO) 667 MG capsule Take 3 capsules (2,001 mg total) by mouth 3 (three)  times daily with meals. 11/27/20  Yes Sharen Hones, MD  carvedilol (COREG) 25 MG tablet Take 25 mg by mouth every morning. 08/15/19  Yes [provider]  clopidogrel (PLAVIX) 75 MG tablet START PLAVIX AND TAKE 300 MG ON 2/7, THEN ON 2/8 TAKE PLAVIX 75 MG DAILY. Patient taking differently: Take 75 mg by mouth daily. START PLAVIX AND TAKE 300 MG ON 2/7, THEN ON 2/8 TAKE PLAVIX 75 MG DAILY. 03/13/20  Yes Kathyrn Drown D, NP  FEROSUL 325 (65 Fe) MG tablet Take 325 mg by mouth daily. 09/08/20  Yes [provider]  fluticasone (FLONASE) 50 MCG/ACT nasal spray Place 2 sprays into both nostrils daily. 09/08/20  Yes [provider]  ibuprofen (ADVIL) 600 MG tablet Take 600 mg by mouth 3 (three) times daily. 12/04/20  Yes [provider]   lidocaine-prilocaine (EMLA) cream Apply 1 application topically Every Tuesday,Thursday,and Saturday with dialysis.  08/15/19  Yes [provider]  losartan (COZAAR) 25 MG tablet Take 25 mg by mouth daily. 10/21/20  Yes [provider]  nitroGLYCERIN (NITROSTAT) 0.4 MG SL tablet PLACE 1 TABLET (0.4 MG TOTAL) UNDER THE TONGUE EVERY FIVE MINUTES AS NEEDED. Patient taking differently: Place 0.4 mg under the tongue every 5 (five) minutes as needed for chest pain. 02/28/20 02/27/21 Yes Reino Bellis B, NP  oxyCODONE (OXY IR/ROXICODONE) 5 MG immediate release tablet Take 1 tablet by mouth every 4 (four) hours as needed. 12/04/20  Yes [provider]  prednisoLONE acetate (PRED FORTE) 1 % ophthalmic suspension Place 1 drop into the left eye every 4 (four) hours.   Yes [provider]  tamsulosin (FLOMAX) 0.4 MG CAPS capsule Take 0.4 mg by mouth daily after breakfast.    Yes [provider]  doxycycline (VIBRAMYCIN) 100 MG capsule Take 1 capsule (100 mg total) by mouth 2 (two) times daily. Patient not taking: No sig reported 11/06/20   Triplett, Tammy, PA-C  pantoprazole (PROTONIX) 40 MG tablet Take 1 tablet (40 mg total) by mouth daily. Patient not taking: No sig reported 03/13/20 03/13/21  Kathyrn Drown D, NP    Physical Exam: Vitals:   12/08/20 1210 12/08/20 1230 12/08/20 1300 12/08/20 1345  BP: 122/73 121/72 129/70 115/85  Pulse: (!) 124 (!) 126 (!) 134 77  Resp: '20 14 11 19  '$ Temp:      SpO2: 99% 100% 99% 100%  Weight:      Height:        Constitutional: Patient lying in bed, visibly uncomfortable  eyes: PERRL, lids and conjunctivae normal ENMT: Mucous membranes are moist. Posterior pharynx clear of any exudate or lesions.Normal dentition.  Neck: normal, supple, no masses, no thyromegaly Respiratory: clear to auscultation bilaterally, no wheezing, no crackles. Normal respiratory effort. No accessory muscle use.  Cardiovascular: Tachycardic, irregular  rhythm, no murmurs / rubs / gallops.  +3 pitting edema present in lower extremities.  Abdomen: no tenderness, no masses palpated. No hepatosplenomegaly. Bowel sounds positive.  Musculoskeletal: no clubbing / cyanosis.  Right hand in bandage secondary to finger amputation. Good ROM, no contractures. Normal muscle tone.  Skin: no rashes, lesions, ulcers. No induration Neurologic: CN 2-12 grossly intact. Sensation intact, DTR normal. Strength 5/5 in all 4.  Psychiatric: Normal judgment and insight. Alert and oriented x 3. Normal mood.   Labs on Admission: I have personally reviewed following labs and imaging studies  CBC: Recent Labs  Lab 12/08/20 1004  WBC 9.3  NEUTROABS 6.8  HGB 8.8*  HCT 28.0*  MCV 76.7*  PLT 0000000   Basic Metabolic Panel: Recent Labs  Lab 12/08/20 1004  NA 137  K 3.2*  CL 107  CO2 16*  GLUCOSE 68*  BUN 37*  CREATININE 9.72*  CALCIUM 8.2*   GFR: Estimated Creatinine Clearance: 10.6 mL/min (A) (by C-G formula based on SCr of 9.72 mg/dL (H)). Liver Function Tests: Recent Labs  Lab 12/08/20 1004  AST 12*  ALT 5  ALKPHOS 75  BILITOT 0.8  PROT 6.6  ALBUMIN 3.0*   No results for input(s): LIPASE, AMYLASE in the last 168 hours. No results for input(s): AMMONIA in the last 168 hours. Coagulation Profile: No results for input(s): INR, PROTIME in the last 168 hours. Cardiac Enzymes: No results for input(s): CKTOTAL, CKMB, CKMBINDEX, TROPONINI in the last 168 hours. BNP (last 3 results) No results for input(s): PROBNP in the last 8760 hours. HbA1C: No results for input(s): HGBA1C in the last 72 hours. CBG: No results for input(s): GLUCAP in the last 168 hours. Lipid Profile: No results for input(s): CHOL, HDL, LDLCALC, TRIG, CHOLHDL, LDLDIRECT in the last 72 hours. Thyroid Function Tests: No results for input(s): TSH, T4TOTAL, FREET4, T3FREE, THYROIDAB in the last 72 hours. Anemia Panel: No results for input(s): VITAMINB12, FOLATE, FERRITIN, TIBC,  IRON, RETICCTPCT in the last 72 hours. Urine analysis:    Component Value Date/Time   COLORURINE YELLOW (A) 11/07/2017 1855   APPEARANCEUR CLOUDY (A) 11/07/2017 1855   LABSPEC 1.016 11/07/2017 1855   PHURINE 5.0 11/07/2017 1855   GLUCOSEU >=500 (A) 11/07/2017 1855   HGBUR MODERATE (A) 11/07/2017 1855   BILIRUBINUR NEGATIVE 11/07/2017 1855   KETONESUR NEGATIVE 11/07/2017 1855   PROTEINUR >=300 (A) 11/07/2017 1855   NITRITE NEGATIVE 11/07/2017 1855   LEUKOCYTESUR NEGATIVE 11/07/2017 1855    Radiological Exams on Admission: CT Head Wo Contrast  Result Date: 12/08/2020 CLINICAL DATA:  Headache, intracranial hemorrhage suspected EXAM: CT HEAD WITHOUT CONTRAST TECHNIQUE: Contiguous axial images were obtained from the base of the skull through the vertex without intravenous contrast. COMPARISON:  November 24, 2020 FINDINGS: Brain: No evidence of acute infarction, hemorrhage, hydrocephalus, extra-axial collection or mass lesion/mass effect. Vascular: Vascular calcifications. Skull: Normal. Negative for fracture or focal lesion. Sinuses/Orbits: No acute finding. Other: None. IMPRESSION: No acute intracranial abnormality. Electronically Signed   By: Valentino Saxon M.D.   On: 12/08/2020 11:37   DG Chest Port 1 View  Result Date: 12/08/2020 CLINICAL DATA:  chest pain EXAM: PORTABLE CHEST 1 VIEW COMPARISON:  November 24, 2020 FINDINGS: The cardiomediastinal silhouette is unchanged and enlarged in contour. No pleural effusion. No pneumothorax. No acute pleuroparenchymal abnormality. Visualized abdomen is unremarkable. IMPRESSION: Cardiomegaly. Electronically Signed   By: Valentino Saxon M.D.   On: 12/08/2020 11:33    EKG: Independently reviewed.  A. fib with RVR present.  Assessment/Plan Principal Problem:   Atrial fibrillation with RVR (HCC) Active Problems:   Mixed hyperlipidemia   Essential hypertension, benign   Uncontrolled type 2 diabetes mellitus with hyperglycemia (HCC)   Diabetic  retinopathy ([place])   Hypertensive chronic kidney disease with stage 1 through stage 4 chronic kidney disease, or unspecified chronic kidney disease   Metabolic acidosis   Secondary hyperparathyroidism of renal origin ([place])   Long term (current) use of insulin ([place])   History of ST elevation myocardial infarction (STEMI) 02/2020 s/p DES to LAD   ESRD (end stage renal disease) (HCC)   Frequent falls   AF (paroxysmal atrial fibrillation) (HCC)   Finger amputation, 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 is a 50 y.o. male with medical history significant of diabetes, CHF, NSTEMI, recent finger potation, paroxysmal atrial fibrillation on anticoagulation, renal failure presents with generalized weakness. He has a history of recurrent syncope, presyncope, falls, and hypotension.  Unclear if patient was compliant with home medication.  Paroxysmal atrial fibrillation -continue amiodarone dose 200 mg  -continue carvedilol 25 mg twice daily -continue Eliquis 5 mg 3 times daily -Continue Plavix 75 mg -Continue atorvastatin 80 mg -While in the ED, patient was started on IV diltiazem, which was titrated to 15 mg/hr. -Home aspirin discontinued -Patient will need cardiology consult -10/4 echocardiogram showed left ventricular hypertrophy  with EF 55 to 60%.    ESRD on dialysis [facility]) - Patient has received dialysis today. --nephrology consult for continuation of dialysis may be needed depending on patient's length of stay in the hospital   Type II diabetes mellitus ([place]) - Sliding scale insulin coverage    DVT prophylaxis: Eliquis, clopidogrel Code Status: full  Family Communication: Patient by himself in his room. Disposition Plan:   Consults called: Cardiology, possibly nephrology   Natividad Brood MS4  Level of care: Stepdown Irwin Brakeman MD [facility] How to contact the [facility] Attending or Consulting provider [place] or covering provider during after hours [place], for this  patient?  Check the care team in [facility] and look for a) attending/consulting [name] provider listed and b) the [facility] team listed Log into [email or web address] and use [place]'s universal password to access. If you do not have the password, please contact the hospital operator. Locate the [facility] provider you are looking for under [facility] and page to a number that you can be directly reached. If you still have difficulty reaching the provider, please page the [facility] (Director on Call) for the Hospitalists listed on amion for assistance.   If 7PM-7AM, please contact night-coverage [email or web address] Password [name]  12/08/2020, 2:57 PM

## 2020-12-08 NOTE — ED Notes (Signed)
Pt sleeping. Denies chest pain. Reluctant to answer questions. Becomes agitated and asks to be left alone when questions asked

## 2020-12-08 NOTE — ED Triage Notes (Signed)
[name] "not feeling well". Denies pain/SOB. Refuses to answer questions.

## 2020-12-08 NOTE — ED Provider Notes (Signed)
[facility] EMERGENCY DEPARTMENT Provider Note   CSN: [id number] Arrival date & time: 12/08/20  [id number]     History Chief Complaint  Patient presents with   Weakness    [name]. is a 50 y.o. male.   Weakness  Patient with history of diabetes, CHF, NSTEMI, recent finger potation, paroxysmal atrial fibrillation on anticoagulation, renal failure presents with generalized weakness.  Level 5 caveat applies secondary to patient mental status.  Patient states "I cannot answer questions when I do not feel well".  When asked if anything hurts patient does not specify.  Patient does not answer any direct questions, will nod yes and no to clarifying questions.  patient was hospitalized 10/3 for NSTEMI.  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  States he is not having pain in his chest, unclear if he is taking his medications.  Patient reportedly attend dialysis today.  Past Medical History:  Diagnosis Date   Anxiety    Cellulitis    CHF (congestive heart failure) (HCC)    Chronic diarrhea    Chronic kidney disease    Chronic pain of both ankles    Diabetes mellitus without complication (HCC)    Diabetes mellitus, type II ([place])    Diabetic neuropathy (HCC)    Frequent falls    Gait instability    GERD (gastroesophageal reflux disease)    Heart palpitations    History of kidney stones    HLD (hyperlipidemia)    Hypertension    Insomnia    Nausea and vomiting in adult    recurrent    Patient Active Problem List   Diagnosis Date Noted   Gangrene of finger of right hand ([place]) 11/25/2020   Postural dizziness with presyncope 11/24/2020   Elevated troponin 11/24/2020   Left leg cellulitis 11/24/2020   Frequent falls 11/24/2020   Finger infection, fourth left 11/24/2020   AF (paroxysmal atrial fibrillation) ([place]) 11/24/2020   Hypotension 11/24/2020   NSTEMI (non-ST elevated myocardial infarction) ([place]) 11/24/2020   Abnormal gait 08/19/2020    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[place]) 08/19/2020   Anxiety state 08/19/2020   Callosity 08/19/2020   ED (erectile dysfunction) of organic origin 08/19/2020   Heart failure ([place]) 08/19/2020   Long term (current) use of insulin ([place]) 08/19/2020   Myalgia 08/19/2020   Obstructive uropathy 08/19/2020   Reactive depression 08/19/2020   History of ST elevation myocardial infarction (STEMI) 02/2020 s/p DES to LAD 08/19/2020   ESRD (end stage renal disease) ([place]) 08/19/2020   Blind painful left eye 08/15/2020   Combined forms of age-related cataract of right eye 04/17/2020   Supraventricular tachycardia ([place]) 02/26/2020   Acute anterior wall MI ([place])    Benign prostatic hyperplasia with lower urinary tract symptoms 11/07/2019   Fluid overload, unspecified 08/15/2019   Hypertension secondary to other renal disorders 07/20/2019   NVG (neovascular glaucoma), left, 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   Symptomatic anemia 06/05/2019   Aftercare including intermittent dialysis ([place]) 05/15/2019   Moderate protein-calorie malnutrition ([place]) 05/14/2019   Anaphylactic reaction due to adverse effect of correct drug or medicament properly administered, initial encounter 05/08/2019   Chronic anemia 05/07/2019   Chest pain, unspecified 05/07/2019   Coagulation defect, unspecified ([place]) 05/07/2019   Disorder of phosphorus metabolism, unspecified 05/07/2019   Encounter for fitting and adjustment of extracorporeal dialysis catheter ([name]) 05/07/2019   Gastroparesis 05/07/2019   Iron deficiency anemia, unspecified 05/07/2019   Major depressive disorder, recurrent, unspecified ([place]) 05/07/2019  Pain, unspecified 05/07/2019   Pruritus, unspecified 05/07/2019   Dizziness Q000111Q   Metabolic acidosis Q000111Q   Syncope 04/06/2019   Urinary retention 04/06/2019   Hypertensive chronic kidney disease with stage 1 through stage 4 chronic kidney disease, or unspecified chronic kidney disease 11/28/2018    Edema 11/28/2018   Hypokalemia 11/28/2018   Hypo-osmolality and hyponatremia 11/28/2018   Proteinuria 11/28/2018   Secondary hyperparathyroidism of renal origin ([place]) 11/28/2018   Lymphedema 01/09/2018   Chronic diastolic heart failure ([place]) 12/13/2017   Vomiting 11/28/2017   Acute on chronic heart failure ([place]) 11/23/2017   Type II diabetes mellitus ([place]) 11/23/2017   Diabetic retinopathy ([place]) 07/05/2017   Uncontrolled diabetes mellitus 07/08/2016   Uncontrolled type 2 diabetes mellitus with hyperglycemia, with long-term current use of insulin ([place]) 04/20/2016   Osteomyelitis of toe of left foot ([place]) 03/15/2016   Acute renal failure with tubular necrosis ([place]) 06/23/2015   Diarrhea 06/23/2015   Mixed hyperlipidemia    Essential hypertension, benign    Heart palpitations 05/06/2015   Sepsis ([place]) 03/07/2015    Past Surgical History:  Procedure Laterality Date   AMPUTATION Left 03/16/2016   Procedure: AMPUTATION DIGIT LEFT HALLUX;  Surgeon: Trula Slade, DPM;  Location: [facility];  Service: Podiatry;  Laterality: Left;  can start around 5    ARTHROSCOPIC REPAIR ACL Left    AV FISTULA PLACEMENT Right 05/31/2019   Procedure: Brachiocephalic AV fistula creation;  Surgeon: Algernon Huxley, MD;  Location: [name] OR[facility];  Service: Vascular;  Laterality: Right;   COLONOSCOPY WITH PROPOFOL N/A 10/28/2015   Procedure: COLONOSCOPY WITH PROPOFOL;  Surgeon: Lollie Sails, MD;  Location: [facility] ENDOSCOPY;  Service: Endoscopy;  Laterality: N/A;   COLONOSCOPY WITH PROPOFOL N/A 10/29/2015   Procedure: COLONOSCOPY WITH PROPOFOL;  Surgeon: Lollie Sails, MD;  Location: [facility] ENDOSCOPY;  Service: Endoscopy;  Laterality: N/A;   CORONARY/GRAFT ACUTE MI REVASCULARIZATION N/A 02/26/2020   Procedure: Coronary/Graft Acute MI Revascularization;  Surgeon: Jettie Booze, MD;  Location: [place] [facility];  Service: Cardiovascular;  Laterality: N/A;   DIALYSIS/PERMA CATHETER INSERTION Right 04/26/2019   Perm  Cath    DIALYSIS/PERMA CATHETER INSERTION N/A 04/26/2019   Procedure: DIALYSIS/PERMA CATHETER INSERTION;  Surgeon: Algernon Huxley, MD;  Location: [place] [facility];  Service: Cardiovascular;  Laterality: N/A;   DIALYSIS/PERMA CATHETER REMOVAL N/A 08/15/2019   Procedure: DIALYSIS/PERMA CATHETER REMOVAL;  Surgeon: Katha Cabal, MD;  Location: [facility];  Service: Cardiovascular;  Laterality: N/A;   ESOPHAGOGASTRODUODENOSCOPY (EGD) WITH PROPOFOL N/A 12/27/2017   Procedure: ESOPHAGOGASTRODUODENOSCOPY (EGD) WITH PROPOFOL;  Surgeon: [place], Benay Pike, MD;  Location: [name] ENDOSCOPY;  Service: Gastroenterology;  Laterality: N/A;   INTRAVASCULAR ULTRASOUND/IVUS N/A 02/26/2020   Procedure: Intravascular Ultrasound/IVUS;  Surgeon: Jettie Booze, MD;  Location: [place] [facility];  Service: Cardiovascular;  Laterality: N/A;   LEFT HEART CATH AND CORONARY ANGIOGRAPHY N/A 02/26/2020   Procedure: LEFT HEART CATH AND CORONARY ANGIOGRAPHY;  Surgeon: Jettie Booze, MD;  Location: [place] [facility];  Service: Cardiovascular;  Laterality: N/A;   PROSTATE SURGERY  2016   TONSILECTOMY/ADENOIDECTOMY WITH MYRINGOTOMY     TONSILLECTOMY     UPPER EXTREMITY ANGIOGRAPHY Right 11/26/2020   Procedure: Upper Extremity Angiography;  Surgeon: Algernon Huxley, MD;  Location: [place] [facility];  Service: Cardiovascular;  Laterality: Right;       Family History  Problem Relation Age of Onset   CAD Father    Stroke Father  Diabetes Mellitus II Mother    Kidney failure Mother    Schizophrenia Mother     Social History   Tobacco Use   Smoking status: Never   Smokeless tobacco: Never  Vaping Use   Vaping Use: Never used  Substance Use Topics   Alcohol use: No   Drug use: No    Home Medications Prior to Admission medications   Medication Sig Start Date End Date Taking? Authorizing Provider  amiodarone (PACERONE) 200 MG tablet Take 200 mg twice a day for 1 week and then start taking 200  mg once a day 11/07/20   Milton Ferguson, MD  amoxicillin (AMOXIL) 500 MG capsule Take 500 mg by mouth daily as needed. 11/07/20   [provider]  apixaban (ELIQUIS) 5 MG TABS tablet Take 1 tablet (5 mg total) by mouth 2 (two) times daily. 11/27/20   Sharen Hones, MD  aspirin 81 MG chewable tablet CHEW 1 TABLET (81 MG TOTAL) BY MOUTH DAILY. 02/28/20 02/27/21  Cheryln Manly, NP  atorvastatin (LIPITOR) 80 MG tablet TAKE 1 TABLET (80 MG TOTAL) BY MOUTH AT BEDTIME. 02/28/20 02/27/21  Reino Bellis B, NP  azelastine (ASTELIN) 0.1 % nasal spray Place 2 sprays into both nostrils 2 (two) times daily. 09/08/20   [provider]  calcium acetate (PHOSLO) 667 MG capsule Take 3 capsules (2,001 mg total) by mouth 3 (three) times daily with meals. 11/27/20   Sharen Hones, MD  carvedilol (COREG) 25 MG tablet Take 25 mg by mouth every morning. 08/15/19   [provider]  clopidogrel (PLAVIX) 75 MG tablet START PLAVIX AND TAKE 300 MG ON 2/7, THEN ON 2/8 TAKE PLAVIX 75 MG DAILY. Patient taking differently: Take 75 mg by mouth daily. START PLAVIX AND TAKE 300 MG ON 2/7, THEN ON 2/8 TAKE PLAVIX 75 MG DAILY. 03/13/20   Tommie Raymond, NP  doxycycline (VIBRAMYCIN) 100 MG capsule Take 1 capsule (100 mg total) by mouth 2 (two) times daily. 11/06/20   Triplett, Tammy, PA-C  FEROSUL 325 (65 Fe) MG tablet Take 325 mg by mouth daily. 09/08/20   [provider]  fluticasone (FLONASE) 50 MCG/ACT nasal spray Place 2 sprays into both nostrils daily. 09/08/20   [provider]  lidocaine-prilocaine (EMLA) cream Apply 1 application topically Every Tuesday,Thursday,and Saturday with dialysis.  08/15/19   [provider]  losartan (COZAAR) 25 MG tablet Take 25 mg by mouth daily. 10/21/20   [provider]  nitroGLYCERIN (NITROSTAT) 0.4 MG SL tablet PLACE 1 TABLET (0.4 MG TOTAL) UNDER THE TONGUE EVERY FIVE MINUTES AS NEEDED. Patient taking differently: Place 0.4 mg under the tongue  every 5 (five) minutes as needed for chest pain. 02/28/20 02/27/21  Cheryln Manly, NP  pantoprazole (PROTONIX) 40 MG tablet Take 1 tablet (40 mg total) by mouth daily. 03/13/20 03/13/21  Kathyrn Drown D, NP  prednisoLONE acetate (PRED FORTE) 1 % ophthalmic suspension Place 1 drop into the left eye every 4 (four) hours.    [provider]  tamsulosin (FLOMAX) 0.4 MG CAPS capsule Take 0.4 mg by mouth daily after breakfast.     [provider]    Allergies    Levofloxacin, Promethazine, Other, and Malt  Review of Systems   Review of Systems  Unable to perform ROS: Other (Patient not cooperating, not answering questions)  Neurological:  Positive for weakness.   Physical Exam Updated Vital Signs BP 115/86   Pulse (!) 50   Temp 97.7 F (36.5 C)  Resp 18   Ht '6\' 3"'$  (1.905 m)   Wt 90.7 kg   SpO2 100%   BMI 25.00 kg/m   Physical Exam Vitals and nursing note reviewed. Exam conducted with a chaperone present.  Constitutional:      Appearance: Normal appearance.     Comments: Patient resting, will awaken to sternal rub.  Conscious, does not answering questions  HENT:     Head: Normocephalic and atraumatic.  Eyes:     General: No scleral icterus.       Right eye: No discharge.        Left eye: No discharge.     Extraocular Movements: Extraocular movements intact.     Pupils: Pupils are equal, round, and reactive to light.  Cardiovascular:     Rate and Rhythm: Tachycardia present. Rhythm irregular.     Pulses: Normal pulses.     Heart sounds: Normal heart sounds. No murmur heard.   No friction rub. No gallop.  Pulmonary:     Effort: Pulmonary effort is normal. No respiratory distress.     Breath sounds: Normal breath sounds.  Abdominal:     General: Abdomen is flat. Bowel sounds are normal. There is no distension.     Palpations: Abdomen is soft.     Tenderness: There is no abdominal tenderness.  Musculoskeletal:     Comments: Right hand and bandages  secondary to amputation.  Skin:    General: Skin is warm and dry.     Coloration: Skin is not jaundiced.  Neurological:     Mental Status: He is alert. Mental status is at baseline.     Coordination: Coordination normal.    ED Results / Procedures / Treatments   Labs (all labs ordered are listed, but only abnormal results are displayed) Labs Reviewed  URINALYSIS, ROUTINE W REFLEX MICROSCOPIC  CBC WITH DIFFERENTIAL/PLATELET  COMPREHENSIVE METABOLIC PANEL  TROPONIN I (HIGH SENSITIVITY)    EKG None  Radiology No results found.  Procedures Procedures   Medications Ordered in ED Medications - No data to display  ED Course  I have reviewed the triage vital signs and the nursing notes.  Pertinent labs & imaging results that were available during my care of the patient were reviewed by me and considered in my medical decision making (see chart for details).    MDM Rules/Calculators/A&P                           Patient tachycardic, A. fib with RVR.  Unclear if patient took his home medicine.  Blood pressure soft at 90/64. Will give patient home medicine and continue to evaluate. .  We will give patient slight fluid bolus given he did have dialysis today and his blood pressure is a bit soft.  Does not appear fluid overloaded.  Patient is anemic, hemoglobin appears to be at baseline.  Do not think patient needs emergent transfusion.  Doubt acute bleed.  No leukocytosis, does not appear infectious etiology.  Patient is now reporting complete loss of vision.  Appeared transient, not present when I was in the room to reevaluate.  We will proceed with CT head.  CT head negative, patient's vision has remained improved.  Patient remains in RVR despite administration of home meds, will start patient on Cardizem drip.  Blood pressure has improved with the fluid bolus, will continue to monitor it.  Patient will need to be admitted for A. fib with RVR.  Final Clinical Impression(s) / ED  Diagnoses Final diagnoses:  None    Rx / DC Orders ED Discharge Orders     None        Sherrill Raring, PA-C 12/08/20 [place], DO 12/08/20 1832

## 2020-12-09 DIAGNOSIS — I129 Hypertensive chronic kidney disease with stage 1 through stage 4 chronic kidney disease, or unspecified chronic kidney disease: Secondary | ICD-10-CM

## 2020-12-09 DIAGNOSIS — I4891 Unspecified atrial fibrillation: Secondary | ICD-10-CM | POA: Diagnosis not present

## 2020-12-09 DIAGNOSIS — I480 Paroxysmal atrial fibrillation: Secondary | ICD-10-CM

## 2020-12-09 DIAGNOSIS — N2581 Secondary hyperparathyroidism of renal origin: Secondary | ICD-10-CM

## 2020-12-09 DIAGNOSIS — R296 Repeated falls: Secondary | ICD-10-CM | POA: Diagnosis not present

## 2020-12-09 DIAGNOSIS — S68119D Complete traumatic metacarpophalangeal amputation of unspecified finger, subsequent encounter: Secondary | ICD-10-CM | POA: Diagnosis not present

## 2020-12-09 DIAGNOSIS — E782 Mixed hyperlipidemia: Secondary | ICD-10-CM

## 2020-12-09 DIAGNOSIS — N186 End stage renal disease: Secondary | ICD-10-CM | POA: Diagnosis not present

## 2020-12-09 LAB — BASIC METABOLIC PANEL
Anion gap: 10 (ref 5–15)
BUN: 51 mg/dL — ABNORMAL HIGH (ref 6–20)
CO2: 20 mmol/L — ABNORMAL LOW (ref 22–32)
Calcium: 7.7 mg/dL — ABNORMAL LOW (ref 8.9–10.3)
Chloride: 106 mmol/L (ref 98–111)
Creatinine, Ser: 12.01 mg/dL — ABNORMAL HIGH (ref 0.61–1.24)
GFR, Estimated: 5 mL/min — ABNORMAL LOW (ref >60)
Glucose, Bld: 87 mg/dL (ref 70–99)
Potassium: 4.3 mmol/L (ref 3.5–5.1)
Sodium: 136 mmol/L (ref 135–145)

## 2020-12-09 LAB — CBC
HCT: 24.8 % — ABNORMAL LOW (ref 39.0–52.0)
Hemoglobin: 7.6 g/dL — ABNORMAL LOW (ref 13.0–17.0)
MCH: 23.8 pg — ABNORMAL LOW (ref 26.0–34.0)
MCHC: 30.6 g/dL (ref 30.0–36.0)
MCV: 77.7 fL — ABNORMAL LOW (ref 80.0–100.0)
Platelets: 172 10*3/uL (ref 150–400)
RBC: 3.19 MIL/uL — ABNORMAL LOW (ref 4.22–5.81)
RDW: 20.3 % — ABNORMAL HIGH (ref 11.5–15.5)
WBC: 8.4 10*3/uL (ref 4.0–10.5)
nRBC: 2.3 % — ABNORMAL HIGH (ref 0.0–0.2)

## 2020-12-09 LAB — MAGNESIUM: Magnesium: 1.9 mg/dL (ref 1.7–2.4)

## 2020-12-09 LAB — T4, FREE: Free T4: 0.83 ng/dL (ref 0.61–1.12)

## 2020-12-09 MED ORDER — NITROGLYCERIN 0.4 MG SL SUBL: 0.4000 mg | SUBLINGUAL_TABLET | SUBLINGUAL | Status: DC | PRN

## 2020-12-09 MED ORDER — AMIODARONE HCL 200 MG PO TABS: ORAL_TABLET | ORAL | 0 refills | Status: DC

## 2020-12-09 MED ORDER — AMIODARONE HCL 200 MG PO TABS
200.0000 mg | ORAL_TABLET | Freq: Two times a day (BID) | ORAL | Status: DC
  Filled 2020-12-09: qty 1

## 2020-12-09 MED ORDER — CLOPIDOGREL BISULFATE 75 MG PO TABS: 75.0000 mg | ORAL_TABLET | Freq: Every day | ORAL | 3 refills | Status: DC

## 2020-12-09 MED ORDER — CHLORHEXIDINE GLUCONATE CLOTH 2 % EX PADS: 6.0000 | MEDICATED_PAD | Freq: Every day | CUTANEOUS | Status: DC

## 2020-12-09 MED ORDER — CARVEDILOL 25 MG PO TABS: 25.0000 mg | ORAL_TABLET | Freq: Two times a day (BID) | ORAL | 1 refills | Status: DC

## 2020-12-09 NOTE — Discharge Summary (Addendum)
Physician Discharge Summary  [name] [id number] DOB: 03/07/1970 DOA: 12/08/2020  PCP: [name], MD VVS: Dr. Lucky Cowboy in [place] Cardiology: [place] date: 12/08/2020 Discharge date: 12/09/2020  Admitted From:  Home  Disposition: Home   Recommendations for Outpatient Follow-up:  Follow up with PCP in 1 weeks Please obtain BMP/CBC in one week Please follow up with cardiology as an outpatient for medication management and further work-up. Please follow up with vascular surgery for wound check as scheduled Please resume regular hemodiaysis schedule   Discharge Condition: STABLE  CODE STATUS: FULL  DIET: renal heart healthy recommended  Brief Hospitalization Summary: Please see all hospital notes, images, labs for full details of the hospitalization. ADMISSION HPI:  Pt presented to emergency department from hemodialysis treatment today with symptomatic atrial fibrillation with RVR.  He reported symptoms of generalized weakness and palpitations.  He was noted to be in rapid atrial fibrillation with RVR and initially restarted on his home carvedilol and subsequently started on a Cardizem infusion given that his heart rate was difficult to control initially.  He remained on the IV Cardizem infusion and is on it at this time.  Plan is to titrate down the IV Cardizem infusion as able.   Patient reports that he received hemodialysis treatment today.  He tells me that he only goes to dialysis treatments on Monday and Friday.  He says that he skips Wednesday treatments.  I do not know how truthful he is being as he is not being cooperative and he is not being a good historian and refusing to talk at times.  He is exhibiting obstinate behavior at this time.    His exam is consistent with rapid atrial fibrillation.  He has multiple vascular deformities.  He has a healing recent finger amputation.  He appears chronically ill.  He appears older than stated age.     He will  need stepdown ICU care given his requirement for a Cardizem infusion.  If he gets rapid improvement in heart rate control he could possibly discharge home tomorrow 12/09/20.   Will consider inpatient cardiology involvement if continuing to have difficulty controlling heart rates.    HOSPITAL COURSE Pt was admitted to stepdown ICU with rapid atrial fibrillation with RVR that started after his hemodialysis treatment.  This has happened to him before.  He was started on IV cardizem infusion and restarted on home carvedilol 25 mg BID.  He converted to normal sinus rhythm and the diltiazem was discontinued.  He was seen by nephrology and cardiology service.  Cardiology has recommended that he take amiodarone '200mg'$  BID x 2 weeks then resume 1 tablet daily, continue carvedilol 25 mg BID.  He is to continue his apixaban and plavix and no plans for inpatient noninvasive testing at this time but for him to follow up outpatient as had already been arranged.  He already is wearing a Zio monitor from prior hospitalization.  He is noncomplaint with taking dialysis treatments three times weekly and was counseled on  doing that and the risks of not taking the treatments regularly.  He is in NSR and his rate is controlled and he is discharging home today.    Discharge Diagnoses:  Principal Problem:   Atrial fibrillation with RVR ([place]) Active Problems:   Mixed hyperlipidemia   Essential hypertension, benign   Uncontrolled type 2 diabetes mellitus with hyperglycemia (HCC)   Diabetic retinopathy ([place])   Hypertensive chronic kidney disease with stage 1 through stage 4 chronic  kidney disease, or unspecified chronic kidney disease   Metabolic acidosis   Secondary hyperparathyroidism of renal origin ([place])   Long term (current) use of insulin ([place])   History of ST elevation myocardial infarction (STEMI) 02/2020 s/p DES to LAD   ESRD (end stage renal disease) (HCC)   Frequent falls   AF (paroxysmal atrial fibrillation)  (HCC)   Finger amputation, 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   Atrial fibrillation with rapid ventricular response [facility])   Discharge Instructions: Discharge Instructions     Amb referral to AFIB Clinic   Complete by: As directed       Allergies as of 12/09/2020       Reactions   Levofloxacin Swelling   Of face Other reaction(s): swelling   Promethazine Diarrhea, Other (See Comments)   Other reaction(s): Other (See Comments), Other (See Comments) Other reaction(s): Muscle cramps Muscle cramps Cramping Other reaction(s): Muscle cramps Muscle cramps   Other    Other reaction(s): Unknown   Malt Other (See Comments)   Other reaction(s): cramping        Medication List     STOP taking these medications    amoxicillin 500 MG capsule Commonly known as: AMOXIL   Aspirin Low Dose 81 MG chewable tablet Generic drug: aspirin   ibuprofen 600 MG tablet Commonly known as: ADVIL   losartan 25 MG tablet Commonly known as: COZAAR   pantoprazole 40 MG tablet Commonly known as: Protonix       TAKE these medications    amiodarone 200 MG tablet Commonly known as: Pacerone 1 twice daily for 2 weeks, then 1 daily What changed: additional instructions   apixaban 5 MG Tabs tablet Commonly known as: ELIQUIS Take 1 tablet (5 mg total) by mouth 2 (two) times daily.   atorvastatin 80 MG tablet Commonly known as: LIPITOR TAKE 1 TABLET (80 MG TOTAL) BY MOUTH AT BEDTIME.   azelastine 0.1 % nasal spray Commonly known as: ASTELIN Place 2 sprays into both nostrils 2 (two) times daily.   calcium acetate 667 MG capsule Commonly known as: PHOSLO Take 3 capsules (2,001 mg total) by mouth 3 (three) times daily with meals.   carvedilol 25 MG tablet Commonly known as: COREG Take 1 tablet (25 mg total) by mouth 2 (two) times daily with a meal. What changed: when to take this   clopidogrel 75 MG tablet Commonly known as: PLAVIX Take 1 tablet (75 mg total) by mouth daily. What changed:  how  much to take how to take this when to take this additional instructions   FeroSul 325 (65 FE) MG tablet Generic drug: ferrous sulfate Take 325 mg by mouth daily.   fluticasone 50 MCG/ACT nasal spray Commonly known as: FLONASE Place 2 sprays into both nostrils daily.   lidocaine-prilocaine cream Commonly known as: EMLA Apply 1 application topically Every Tuesday,Thursday,and Saturday with dialysis.   nitroGLYCERIN 0.4 MG SL tablet Commonly known as: NITROSTAT Place 1 tablet (0.4 mg total) under the tongue every 5 (five) minutes as needed for chest pain.   oxyCODONE 5 MG immediate release tablet Commonly known as: Oxy IR/ROXICODONE Take 1 tablet by mouth every 4 (four) hours as needed.   prednisoLONE acetate 1 % ophthalmic suspension Commonly known as: PRED FORTE Place 1 drop into the left eye every 4 (four) hours.   tamsulosin 0.4 MG Caps capsule Commonly known as: FLOMAX Take 0.4 mg by mouth daily after breakfast.        Follow-up Information     [name],  MD. Schedule an appointment as soon as possible for a visit in 1 week(s).   Specialty: Family Medicine Why: Hospital Follow Up Contact information: [address] [place] [address] [phone]         Jettie Booze, MD. Schedule an appointment as soon as possible for a visit in 2 week(s).   Specialties: Cardiology, Radiology, Interventional Cardiology Why: Hospital Follow Up Contact information: [id number] [place] [address] [phone]         Algernon Huxley, MD. Go to.   Specialties: Vascular Surgery, Radiology, Interventional Cardiology Why: For wound re-check Contact information: [address] [place] [address] [phone]                Allergies  Allergen Reactions   Levofloxacin Swelling    Of face Other reaction(s): swelling   Promethazine Diarrhea and Other (See Comments)    Other reaction(s): Other (See Comments), Other (See  Comments) Other reaction(s): Muscle cramps Muscle cramps Cramping Other reaction(s): Muscle cramps Muscle cramps   Other     Other reaction(s): Unknown   Malt Other (See Comments)    Other reaction(s): cramping   Allergies as of 12/09/2020       Reactions   Levofloxacin Swelling   Of face Other reaction(s): swelling   Promethazine Diarrhea, Other (See Comments)   Other reaction(s): Other (See Comments), Other (See Comments) Other reaction(s): Muscle cramps Muscle cramps Cramping Other reaction(s): Muscle cramps Muscle cramps   Other    Other reaction(s): Unknown   Malt Other (See Comments)   Other reaction(s): cramping        Medication List     STOP taking these medications    amoxicillin 500 MG capsule Commonly known as: AMOXIL   Aspirin Low Dose 81 MG chewable tablet Generic drug: aspirin   ibuprofen 600 MG tablet Commonly known as: ADVIL   losartan 25 MG tablet Commonly known as: COZAAR   pantoprazole 40 MG tablet Commonly known as: Protonix       TAKE these medications    amiodarone 200 MG tablet Commonly known as: Pacerone 1 twice daily for 2 weeks, then 1 daily What changed: additional instructions   apixaban 5 MG Tabs tablet Commonly known as: ELIQUIS Take 1 tablet (5 mg total) by mouth 2 (two) times daily.   atorvastatin 80 MG tablet Commonly known as: LIPITOR TAKE 1 TABLET (80 MG TOTAL) BY MOUTH AT BEDTIME.   azelastine 0.1 % nasal spray Commonly known as: ASTELIN Place 2 sprays into both nostrils 2 (two) times daily.   calcium acetate 667 MG capsule Commonly known as: PHOSLO Take 3 capsules (2,001 mg total) by mouth 3 (three) times daily with meals.   carvedilol 25 MG tablet Commonly known as: COREG Take 1 tablet (25 mg total) by mouth 2 (two) times daily with a meal. What changed: when to take this   clopidogrel 75 MG tablet Commonly known as: PLAVIX Take 1 tablet (75 mg total) by mouth daily. What changed:  how much  to take how to take this when to take this additional instructions   FeroSul 325 (65 FE) MG tablet Generic drug: ferrous sulfate Take 325 mg by mouth daily.   fluticasone 50 MCG/ACT nasal spray Commonly known as: FLONASE Place 2 sprays into both nostrils daily.   lidocaine-prilocaine cream Commonly known as: EMLA Apply 1 application topically Every Tuesday,Thursday,and Saturday with dialysis.   nitroGLYCERIN 0.4 MG SL tablet Commonly known as:  NITROSTAT Place 1 tablet (0.4 mg total) under the tongue every 5 (five) minutes as needed for chest pain.   oxyCODONE 5 MG immediate release tablet Commonly known as: Oxy IR/ROXICODONE Take 1 tablet by mouth every 4 (four) hours as needed.   prednisoLONE acetate 1 % ophthalmic suspension Commonly known as: PRED FORTE Place 1 drop into the left eye every 4 (four) hours.   tamsulosin 0.4 MG Caps capsule Commonly known as: FLOMAX Take 0.4 mg by mouth daily after breakfast.        Procedures/Studies: CT Head Wo Contrast  Result Date: 12/08/2020 CLINICAL DATA:  Headache, intracranial hemorrhage suspected EXAM: CT HEAD WITHOUT CONTRAST TECHNIQUE: Contiguous axial images were obtained from the base of the skull through the vertex without intravenous contrast. COMPARISON:  November 24, 2020 FINDINGS: Brain: No evidence of acute infarction, hemorrhage, hydrocephalus, extra-axial collection or mass lesion/mass effect. Vascular: Vascular calcifications. Skull: Normal. Negative for fracture or focal lesion. Sinuses/Orbits: No acute finding. Other: None. IMPRESSION: No acute intracranial abnormality. Electronically Signed   By: Valentino Saxon M.D.   On: 12/08/2020 11:37   CT HEAD WO CONTRAST (5MM)  Result Date: 11/24/2020 CLINICAL DATA:  Mental status change, unknown cause; Neck trauma, impaired ROM (Age 63-64y) EXAM: CT HEAD WITHOUT CONTRAST CT CERVICAL SPINE WITHOUT CONTRAST TECHNIQUE: Multidetector CT imaging of the head and cervical spine  was performed following the standard protocol without intravenous contrast. Multiplanar CT image reconstructions of the cervical spine were also generated. COMPARISON:  07/18/2017 FINDINGS: CT HEAD FINDINGS Brain: No evidence of acute infarction, hemorrhage, hydrocephalus, extra-axial collection or mass lesion/mass effect. Vascular: Atherosclerotic calcifications involving the large vessels of the skull base. No unexpected hyperdense vessel. Skull: Normal. Negative for fracture or focal lesion. Sinuses/Orbits: No acute finding. Other: None. CT CERVICAL SPINE FINDINGS Alignment: Facet joints are aligned without dislocation or traumatic listhesis. Dens and lateral masses are aligned. Straightening of the cervical lordosis. Skull base and vertebrae: No acute fracture. No primary bone lesion or focal pathologic process. Soft tissues and spinal canal: No prevertebral fluid or swelling. No visible canal hematoma. 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 Minimal facet arthropathy bilaterally. Upper chest: Included lung apices are clear. Other: None. IMPRESSION: 1. No acute intracranial findings. 2. No acute fracture or traumatic listhesis of the cervical spine. 3. 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 Electronically Signed   By: Davina Poke D.O.   On: 11/24/2020 21:29   CT Cervical Spine Wo Contrast  Result Date: 11/24/2020 CLINICAL DATA:  Mental status change, unknown cause; Neck trauma, impaired ROM (Age 43-64y) EXAM: CT HEAD WITHOUT CONTRAST CT CERVICAL SPINE WITHOUT CONTRAST TECHNIQUE: Multidetector CT imaging of the head and cervical spine was performed following the standard protocol without intravenous contrast. Multiplanar CT image reconstructions of the cervical spine were also generated. COMPARISON:  07/18/2017 FINDINGS: CT HEAD FINDINGS Brain: No evidence of acute infarction, hemorrhage, hydrocephalus, extra-axial collection or mass lesion/mass effect. Vascular: Atherosclerotic calcifications  involving the large vessels of the skull base. No unexpected hyperdense vessel. Skull: Normal. Negative for fracture or focal lesion. Sinuses/Orbits: No acute finding. Other: None. CT CERVICAL SPINE FINDINGS Alignment: Facet joints are aligned without dislocation or traumatic listhesis. Dens and lateral masses are aligned. Straightening of the cervical lordosis. Skull base and vertebrae: No acute fracture. No primary bone lesion or focal pathologic process. Soft tissues and spinal canal: No prevertebral fluid or swelling. No visible canal hematoma. 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 Minimal facet arthropathy bilaterally. Upper chest: Included lung  apices are clear. Other: None. IMPRESSION: 1. No acute intracranial findings. 2. No acute fracture or traumatic listhesis of the cervical spine. 3. 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 Electronically Signed   By: Davina Poke D.O.   On: 11/24/2020 21:29   PERIPHERAL VASCULAR CATHETERIZATION  Result Date: 11/26/2020 See surgical note for result.  DG Chest Port 1 View  Result Date: 12/08/2020 CLINICAL DATA:  chest pain EXAM: PORTABLE CHEST 1 VIEW COMPARISON:  November 24, 2020 FINDINGS: The cardiomediastinal silhouette is unchanged and enlarged in contour. No pleural effusion. No pneumothorax. No acute pleuroparenchymal abnormality. Visualized abdomen is unremarkable. IMPRESSION: Cardiomegaly. Electronically Signed   By: Valentino Saxon M.D.   On: 12/08/2020 11:33   DG Chest Port 1 View  Result Date: 11/24/2020 CLINICAL DATA:  Weakness EXAM: PORTABLE CHEST 1 VIEW COMPARISON:  11/07/2020, 06/04/2019, 02/26/2020 FINDINGS: Cardiomegaly without edema, pleural effusion, focal opacity or pneumothorax. IMPRESSION: No active disease. Similar cardiomegaly since January 2022, increased compared to radiographs prior to this. Findings could be due to multi chamber enlargement or pericardial effusion. Electronically Signed   By: Donavan Foil M.D.   On:  11/24/2020 21:15   DG Finger Ring Right  Result Date: 11/24/2020 CLINICAL DATA:  Discoloration of ring finger EXAM: RIGHT RING FINGER 2+V COMPARISON:  None. FINDINGS: No fracture or malalignment. Vascular calcifications. Possible ulcer at the tip of the fourth digit. Possible small amount of air under the nail bed. IMPRESSION: No acute osseous abnormality. Possible soft tissue ulcer at the tip of the fourth digit Electronically Signed   By: Donavan Foil M.D.   On: 11/24/2020 21:16   ECHOCARDIOGRAM COMPLETE  Result Date: 11/25/2020    ECHOCARDIOGRAM REPORT   Patient Name:   Lexis Prenatt. Date of Exam: 11/25/2020 Medical Rec #:  [id number]            Height:       75.0 in Accession #:    [id number]           Weight:       205.0 lb Date of Birth:  1971-01-03             BSA:          2.218 m Patient Age:    50 years             BP:           130/76 mmHg Patient Gender: M                    HR:           71 bpm. Exam Location:  [name] Procedure: 2D Echo, Color Doppler, Cardiac Doppler and Strain Analysis Indications:     I21.4 NSTEMI  History:         Patient has prior history of Echocardiogram examinations. CHF,                  CKD; Risk Factors:Hypertension, Diabetes and Dyslipidemia.  Sonographer:     Charmayne Sheer Referring Phys:  [id number] Loletha Grayer Diagnosing Phys: Nelva Bush MD  Sonographer Comments: Suboptimal subcostal window. Global longitudinal strain was attempted. IMPRESSIONS  1. Left ventricular ejection fraction, by estimation, is 55 to 60%. The left ventricle has normal function. The left ventricle has no regional wall motion abnormalities. The left ventricular internal cavity size was borderline dilated. There is moderate  left ventricular hypertrophy. Left ventricular diastolic parameters are consistent with Grade II diastolic dysfunction (pseudonormalization). Elevated left atrial  pressure. The average left ventricular global longitudinal strain is -13.0 %. The global longitudinal  strain is abnormal.  2. Right ventricular systolic function is normal. The right ventricular size is mildly enlarged. Tricuspid regurgitation signal is inadequate for assessing PA pressure.  3. Left atrial size was mild to moderately dilated.  4. Right atrial size was mildly dilated.  5. There is a small to moderate pericardial effusion. There is no evidence of cardiac tamponade.  6. The mitral valve is normal in structure. Trivial mitral valve regurgitation. No evidence of mitral stenosis.  7. The aortic valve is tricuspid. Aortic valve regurgitation is not visualized. No aortic stenosis is present.  8. Aortic dilatation noted. There is borderline dilatation of the aortic root, measuring 38 mm.  9. Mildly dilated pulmonary artery. 10. The inferior vena cava is dilated in size with >50% respiratory variability, suggesting right atrial pressure of 8 mmHg. FINDINGS  Left Ventricle: Left ventricular ejection fraction, by estimation, is 55 to 60%. The left ventricle has normal function. The left ventricle has no regional wall motion abnormalities. The average left ventricular global longitudinal strain is -13.0 %. The global longitudinal strain is abnormal. The left ventricular internal cavity size was borderline dilated. There is moderate left ventricular hypertrophy. Left ventricular diastolic parameters are consistent with Grade II diastolic dysfunction (pseudonormalization). Elevated left atrial pressure. Right Ventricle: The right ventricular size is mildly enlarged. No increase in right ventricular wall thickness. Right ventricular systolic function is normal. Tricuspid regurgitation signal is inadequate for assessing PA pressure. Left Atrium: Left atrial size was mild to moderately dilated. Right Atrium: Right atrial size was mildly dilated. Pericardium: There is a small to moderate pericardial effusion. There is no evidence of cardiac tamponade. Mitral Valve: The mitral valve is normal in structure. Trivial  mitral valve regurgitation. No evidence of mitral valve stenosis. MV peak gradient, 4.8 mmHg. The mean mitral valve gradient is 2.0 mmHg. Tricuspid Valve: The tricuspid valve is normal in structure. Tricuspid valve regurgitation is mild. Aortic Valve: The aortic valve is tricuspid. Aortic valve regurgitation is not visualized. No aortic stenosis is present. Aortic valve mean gradient measures 5.0 mmHg. Aortic valve peak gradient measures 7.7 mmHg. Aortic valve area, by VTI measures 3.71 cm. Pulmonic Valve: The pulmonic valve was normal in structure. Pulmonic valve regurgitation is not visualized. No evidence of pulmonic stenosis. Aorta: Aortic dilatation noted. There is borderline dilatation of the aortic root, measuring 38 mm. Pulmonary Artery: The pulmonary artery is mildly dilated. Venous: The inferior vena cava is dilated in size with greater than 50% respiratory variability, suggesting right atrial pressure of 8 mmHg. IAS/Shunts: The interatrial septum was not well visualized.  LEFT VENTRICLE PLAX 2D LVIDd:         5.63 cm  Diastology LVIDs:         3.56 cm  LV e' medial:    5.87 cm/s LV PW:         1.36 cm  LV E/e' medial:  18.9 LV IVS:        1.21 cm  LV e' lateral:   11.10 cm/s LVOT diam:     2.60 cm  LV E/e' lateral: 10.0 LV SV:         115 LV SV Index:   52       2D Longitudinal Strain LVOT Area:     5.31 cm 2D Strain GLS Avg:     -13.0 %  RIGHT VENTRICLE RV Basal diam:  4.56 cm TAPSE (M-mode): 2.2  cm LEFT ATRIUM              Index       RIGHT ATRIUM           Index LA diam:        4.80 cm  2.16 cm/m  RA Area:     21.10 cm LA Vol (A2C):   60.0 ml  27.06 ml/m RA Volume:   64.40 ml  29.04 ml/m LA Vol (A4C):   112.0 ml 50.51 ml/m LA Biplane Vol: 89.9 ml  40.54 ml/m  AORTIC VALVE                    PULMONIC VALVE AV Area (Vmax):    3.97 cm     PV Vmax:       1.16 m/s AV Area (Vmean):   3.76 cm     PV Vmean:      76.400 cm/s AV Area (VTI):     3.71 cm     PV VTI:        0.251 m AV Vmax:            139.00 cm/s  PV Peak grad:  5.4 mmHg AV Vmean:          106.000 cm/s PV Mean grad:  3.0 mmHg AV VTI:            0.309 m AV Peak Grad:      7.7 mmHg AV Mean Grad:      5.0 mmHg LVOT Vmax:         104.00 cm/s LVOT Vmean:        75.000 cm/s LVOT VTI:          0.216 m LVOT/AV VTI ratio: 0.70  AORTA Ao Root diam: 3.80 cm MITRAL VALVE MV Area (PHT): 4.26 cm     SHUNTS MV Area VTI:   3.98 cm     Systemic VTI:  0.22 m MV Peak grad:  4.8 mmHg     Systemic Diam: 2.60 cm MV Mean grad:  2.0 mmHg MV Vmax:       1.09 m/s MV Vmean:      67.2 cm/s MV Decel Time: 178 msec MV E velocity: 111.00 cm/s MV A velocity: 53.60 cm/s MV E/A ratio:  2.07 Harrell Gave End MD Electronically signed by Nelva Bush MD Signature Date/Time: 11/25/2020/12:02:59 PM    Final      Subjective: Patient states that he feels a lot better this morning.  He states that he presented to the emergency department after receiving hemodialysis yesterday.  He states that he commonly feels weak and dizzy after hemodialysis.  Patient states that he was adherent to his medications since last hospitalization on 10/3.  During prior hospitalization, patient did decline cardiology work-up while inpatient.  Discussed this further with patient; he was agreeable to getting cardiology work-up during this hospitalization.  Patient currently denies shortness of breath, dizziness, palpitations or chest pains.   Also discussed patient's dialysis schedule.  He states that he was receiving dialysis on Monday, Wednesday, and Friday.  But due to inability to tolerate dialysis, he has been going on Mondays and Fridays only.  Patient has been seen by cardiology.  Patient was informed of the recommendations to increase patient's amiodarone dose to 200 mg BID for 2 weeks.  Patient was agreeable to this change in his medication and expressed understanding of medication change.  Patient was also instructed to follow-up with cardiology as an outpatient.  Discharge  Exam: Vitals:   12/09/20 0712 12/09/20 1103  BP:    Pulse: 69   Resp: 16 12  Temp: 99 F (37.2 C) 98.6 F (37 C)  SpO2: 100%    Vitals:   12/09/20 0600 12/09/20 0700 12/09/20 0712 12/09/20 1103  BP: (!) 172/74 (!) 158/71    Pulse: 70 70 69   Resp: '13 10 16 12  '$ Temp:   99 F (37.2 C) 98.6 F (37 C)  TempSrc:   Oral Oral  SpO2: 98% 98% 100%   Weight:      Height:        Examination:   General exam: Appears calm and comfortable.  Patient in no acute distress. Respiratory system: Clear to auscultation. Respiratory effort normal. Cardiovascular system: normal S1 & S2 heard. No JVD, murmurs, rubs, gallops or clicks.   +3 pitting edema present in lower extremities. Gastrointestinal system: Abdomen is nondistended, soft and nontender. No organomegaly or masses felt. Normal bowel sounds heard. Central nervous system: Alert and oriented. No focal neurological deficits. Musculoskeletal: no clubbing / cyanosis.  Right hand in bandage secondary to finger amputation. Good ROM, no contractures. Normal muscle tone.  Skin: No rashes, lesions or ulcers Psychiatry: Judgement and insight appear POOR. Mood & affect flat.    The results of significant diagnostics from this hospitalization (including imaging, microbiology, ancillary and laboratory) are listed below for reference.     Microbiology: Recent Results (from the past 240 hour(s))  Resp Panel by RT-PCR (Flu A&B, Covid) Nasopharyngeal Swab     Status: None   Collection Time: 12/08/20  2:00 PM   Specimen: Nasopharyngeal Swab; Nasopharyngeal(NP) swabs in vial transport medium  Result Value Ref Range Status   SARS Coronavirus 2 by RT PCR NEGATIVE NEGATIVE Final    Comment: (NOTE) SARS-CoV-2 target nucleic acids are NOT DETECTED.  The SARS-CoV-2 RNA is generally detectable in upper respiratory specimens during the acute phase of infection. The lowest concentration of SARS-CoV-2 viral copies this assay can detect is 138 copies/mL. A  negative result does not preclude SARS-Cov-2 infection and should not be used as the sole basis for treatment or other patient management decisions. A negative result may occur with  improper specimen collection/handling, submission of specimen other than nasopharyngeal swab, presence of viral mutation(s) within the areas targeted by this assay, and inadequate number of viral copies(<138 copies/mL). A negative result must be combined with clinical observations, patient history, and epidemiological information. The expected result is Negative.  Fact Sheet for Patients:  EntrepreneurPulse.com.au  Fact Sheet for Healthcare Providers:  IncredibleEmployment.be  This test is no t yet approved or cleared by the [place] FDA and  has been authorized for detection and/or diagnosis of SARS-CoV-2 by FDA under an Emergency Use Authorization (EUA). This EUA will remain  in effect (meaning this test can be used) for the duration of the COVID-19 declaration under Section 564(b)(1) of the Act, 21 U.S.C.section 360bbb-3(b)(1), unless the authorization is terminated  or revoked sooner.       Influenza A by PCR NEGATIVE NEGATIVE Final   Influenza B by PCR NEGATIVE NEGATIVE Final    Comment: (NOTE) The Xpert Xpress SARS-CoV-2/FLU/RSV plus assay is intended as an aid in the diagnosis of influenza from Nasopharyngeal swab specimens and should not be used as a sole basis for treatment. Nasal washings and aspirates are unacceptable for Xpert Xpress SARS-CoV-2/FLU/RSV testing.  Fact Sheet for Patients: EntrepreneurPulse.com.au  Fact Sheet for Healthcare Providers: IncredibleEmployment.be  This test is  not yet approved or cleared by the [place] and has been authorized for detection and/or diagnosis of SARS-CoV-2 by FDA under an Emergency Use Authorization (EUA). This EUA will remain in effect (meaning this test can  be used) for the duration of the COVID-19 declaration under Section 564(b)(1) of the Act, 21 U.S.C. section 360bbb-3(b)(1), unless the authorization is terminated or revoked.  Performed at [facility], [address], [place], [place] [address]   MRSA Next Gen by PCR, Nasal     Status: None   Collection Time: 12/08/20  4:00 PM   Specimen: Nasal Mucosa; Nasal Swab  Result Value Ref Range Status   MRSA by PCR Next Gen NOT DETECTED NOT DETECTED Final    Comment: (NOTE) The GeneXpert MRSA Assay (FDA approved for NASAL specimens only), is one component of a comprehensive MRSA colonization surveillance program. It is not intended to diagnose MRSA infection nor to guide or monitor treatment for MRSA infections. Test performance is not FDA approved in patients less than 37 years old. Performed at [facility], [address], [place],  [address]      Labs: BNP (last 3 results) Recent Labs    12/08/20 1004  BNP [id number]*   Basic Metabolic Panel: Recent Labs  Lab 12/08/20 1004 12/09/20 0430  NA 137 136  K 3.2* 4.3  CL 107 106  CO2 16* 20*  GLUCOSE 68* 87  BUN 37* 51*  CREATININE 9.72* 12.01*  CALCIUM 8.2* 7.7*  MG  --  1.9   Liver Function Tests: Recent Labs  Lab 12/08/20 1004  AST 12*  ALT 5  ALKPHOS 75  BILITOT 0.8  PROT 6.6  ALBUMIN 3.0*   No results for input(s): LIPASE, AMYLASE in the last 168 hours. No results for input(s): AMMONIA in the last 168 hours. CBC: Recent Labs  Lab 12/08/20 1004 12/09/20 0430  WBC 9.3 8.4  NEUTROABS 6.8  --   HGB 8.8* 7.6*  HCT 28.0* 24.8*  MCV 76.7* 77.7*  PLT 193 172   Cardiac Enzymes: No results for input(s): CKTOTAL, CKMB, CKMBINDEX, TROPONINI in the last 168 hours. BNP: Invalid input(s): POCBNP CBG: Recent Labs  Lab 12/08/20 1737  GLUCAP 92   D-Dimer No results for input(s): DDIMER in the last 72 hours. Hgb A1c No results for input(s): HGBA1C in the last 72 hours. Lipid Profile No results for input(s):  CHOL, HDL, LDLCALC, TRIG, CHOLHDL, LDLDIRECT in the last 72 hours. Thyroid function studies Recent Labs    12/08/20 1004  TSH 6.354*   Anemia work up No results for input(s): VITAMINB12, FOLATE, FERRITIN, TIBC, IRON, RETICCTPCT in the last 72 hours. Urinalysis    Component Value Date/Time   COLORURINE YELLOW (A) 11/07/2017 1855   APPEARANCEUR CLOUDY (A) 11/07/2017 1855   LABSPEC 1.016 11/07/2017 1855   PHURINE 5.0 11/07/2017 1855   GLUCOSEU >=500 (A) 11/07/2017 1855   HGBUR MODERATE (A) 11/07/2017 1855   BILIRUBINUR NEGATIVE 11/07/2017 1855   KETONESUR NEGATIVE 11/07/2017 1855   PROTEINUR >=300 (A) 11/07/2017 1855   NITRITE NEGATIVE 11/07/2017 1855   LEUKOCYTESUR NEGATIVE 11/07/2017 1855   Sepsis Labs Invalid input(s): PROCALCITONIN,  WBC,  LACTICIDVEN Microbiology Recent Results (from the past 240 hour(s))  Resp Panel by RT-PCR (Flu A&B, Covid) Nasopharyngeal Swab     Status: None   Collection Time: 12/08/20  2:00 PM   Specimen: Nasopharyngeal Swab; Nasopharyngeal(NP) swabs in vial transport medium  Result Value Ref Range Status   SARS Coronavirus 2 by RT  PCR NEGATIVE NEGATIVE Final    Comment: (NOTE) SARS-CoV-2 target nucleic acids are NOT DETECTED.  The SARS-CoV-2 RNA is generally detectable in upper respiratory specimens during the acute phase of infection. The lowest concentration of SARS-CoV-2 viral copies this assay can detect is 138 copies/mL. A negative result does not preclude SARS-Cov-2 infection and should not be used as the sole basis for treatment or other patient management decisions. A negative result may occur with  improper specimen collection/handling, submission of specimen other than nasopharyngeal swab, presence of viral mutation(s) within the areas targeted by this assay, and inadequate number of viral copies(<138 copies/mL). A negative result must be combined with clinical observations, patient history, and epidemiological information. The  expected result is Negative.  Fact Sheet for Patients:  EntrepreneurPulse.com.au  Fact Sheet for Healthcare Providers:  IncredibleEmployment.be  This test is no t yet approved or cleared by the [place] FDA and  has been authorized for detection and/or diagnosis of SARS-CoV-2 by FDA under an Emergency Use Authorization (EUA). This EUA will remain  in effect (meaning this test can be used) for the duration of the COVID-19 declaration under Section 564(b)(1) of the Act, 21 U.S.C.section 360bbb-3(b)(1), unless the authorization is terminated  or revoked sooner.       Influenza A by PCR NEGATIVE NEGATIVE Final   Influenza B by PCR NEGATIVE NEGATIVE Final    Comment: (NOTE) The Xpert Xpress SARS-CoV-2/FLU/RSV plus assay is intended as an aid in the diagnosis of influenza from Nasopharyngeal swab specimens and should not be used as a sole basis for treatment. Nasal washings and aspirates are unacceptable for Xpert Xpress SARS-CoV-2/FLU/RSV testing.  Fact Sheet for Patients: EntrepreneurPulse.com.au  Fact Sheet for Healthcare Providers: IncredibleEmployment.be  This test is not yet approved or cleared by the [place] FDA and has been authorized for detection and/or diagnosis of SARS-CoV-2 by FDA under an Emergency Use Authorization (EUA). This EUA will remain in effect (meaning this test can be used) for the duration of the COVID-19 declaration under Section 564(b)(1) of the Act, 21 U.S.C. section 360bbb-3(b)(1), unless the authorization is terminated or revoked.  Performed at [facility], [address], [place], [place] [address]   MRSA Next Gen by PCR, Nasal     Status: None   Collection Time: 12/08/20  4:00 PM   Specimen: Nasal Mucosa; Nasal Swab  Result Value Ref Range Status   MRSA by PCR Next Gen NOT DETECTED NOT DETECTED Final    Comment: (NOTE) The GeneXpert MRSA Assay (FDA approved for  NASAL specimens only), is one component of a comprehensive MRSA colonization surveillance program. It is not intended to diagnose MRSA infection nor to guide or monitor treatment for MRSA infections. Test performance is not FDA approved in patients less than 44 years old. Performed at [facility], [address] [place], [place] [address]    Time coordinating discharge:   SIGNED:   Patient seen and examined with Natividad Brood, Medical student. In addition to supervising the encounter, I played a key role in the decision making process as well as reviewed key findings. Pt has been seen by cardiology and nephrology.  He is in NSR at this time with a controlled heart rate.  He will have outpatient cardiology work up.  He wants to go home and won't stay another day.  Will DC home and have him follow up outpatient.     Valentino Nose Sweet MS4  Irwin Brakeman, MD  [facility] 12/09/2020, 11:45 AM How  to contact the [facility] Attending or Consulting provider [place] or covering provider during after hours [place], for this patient?  Check the care team in [facility] and look for a) attending/consulting [name] provider listed and b) the [facility] team listed Log into [email or web address] and use [place]'s universal password to access. If you do not have the password, please contact the hospital operator. Locate the [facility] provider you are looking for under [facility] and page to a number that you can be directly reached. If you still have difficulty reaching the provider, please page the [facility] (Director on Call) for the Hospitalists listed on amion for assistance.

## 2020-12-09 NOTE — Discharge Instructions (Signed)
IMPORTANT INFORMATION: PAY CLOSE ATTENTION   PHYSICIAN DISCHARGE INSTRUCTIONS  Follow with Primary care provider  Kikel, Stephen, MD  and other consultants as instructed by your Hospitalist Physician  SEEK MEDICAL CARE OR RETURN TO EMERGENCY ROOM IF SYMPTOMS COME BACK, WORSEN OR NEW PROBLEM DEVELOPS   Please note: You were cared for by a hospitalist during your hospital stay. Every effort will be made to forward records to your primary care provider.  You can request that your primary care provider send for your hospital records if they have not received them.  Once you are discharged, your primary care physician will handle any further medical issues. Please note that NO REFILLS for any discharge medications will be authorized once you are discharged, as it is imperative that you return to your primary care physician (or establish a relationship with a primary care physician if you do not have one) for your post hospital discharge needs so that they can reassess your need for medications and monitor your lab values.  Please get a complete blood count and chemistry panel checked by your Primary MD at your next visit, and again as instructed by your Primary MD.  Get Medicines reviewed and adjusted: Please take all your medications with you for your next visit with your Primary MD  Laboratory/radiological data: Please request your Primary MD to go over all hospital tests and procedure/radiological results at the follow up, please ask your primary care provider to get all Hospital records sent to his/her office.  In some cases, they will be blood work, cultures and biopsy results pending at the time of your discharge. Please request that your primary care provider follow up on these results.  If you are diabetic, please bring your blood sugar readings with you to your follow up appointment with primary care.    Please call and make your follow up appointments as soon as possible.    Also Note  the following: If you experience worsening of your admission symptoms, develop shortness of breath, life threatening emergency, suicidal or homicidal thoughts you must seek medical attention immediately by calling 911 or calling your MD immediately  if symptoms less severe.  You must read complete instructions/literature along with all the possible adverse reactions/side effects for all the Medicines you take and that have been prescribed to you. Take any new Medicines after you have completely understood and accpet all the possible adverse reactions/side effects.   Do not drive when taking Pain medications or sleeping medications (Benzodiazepines)  Do not take more than prescribed Pain, Sleep and Anxiety Medications. It is not advisable to combine anxiety,sleep and pain medications without talking with your primary care practitioner  Special Instructions: If you have smoked or chewed Tobacco  in the last 2 yrs please stop smoking, stop any regular Alcohol  and or any Recreational drug use.  Wear Seat belts while driving.  Do not drive if taking any narcotic, mind altering or controlled substances or recreational drugs or alcohol.       

## 2020-12-09 NOTE — Consult Note (Addendum)
Cardiology Consultation:   Patient ID: [name]. MRN: [id number]; DOB: [birth year]  Admit date: 12/08/2020 Date of Consult: 12/09/2020  PCP:  [name], MD   [facility] Providers Cardiologist:  Larae Grooms, MD        Patient Profile:   [name]. is a 50 y.o. male with a hx of CAD (s/p anterior STEMI in 02/2020 with DES to mid-LAD), paroxysmal atrial fibrillation, HFpEF, pericardial effusion, ESRD, HTN, HLD, dry gangrene, chronic anemia, GERD and medical noncompliance who is being seen 12/09/2020 for the evaluation of recurrent atrial fibrillation with RVR following HD at the request of Dr. Wynetta Emery.  History of Present Illness:   Mr. Gangl was most recently admitted to [facility] from 10/3 - 11/27/2020 for evaluation of presyncope and confusion while at HD. Was found to be in atrial fibrillation with RVR upon arrival and received IV Cardizem and converted back to NSR. His Hs Troponin values peaked at 1556 which was felt to be secondary to demand ischemia as he denied any recent anginal symptoms and echo that admission showed a preserved EF of 55-60% with no regional WMA. Was recommended he have a Lexiscan Myoview due to NSVT during admission but the patient declined and wanted to have the test as an outpatient. Zio was placed at discharge. He was discharged on Amiodarone '200mg'$  BID for 1 week followed by '200mg'$  daily along with being placed on Eliquis for anticoagulation.   He presented back to [facility] ED on 12/08/2020 for evaluation of palpitations, presyncope and dizziness since undergoing dialysis. He denied any specific chest pain or worsening dyspnea. Was found to be in atrial fibrillation with RVR darted on IV Cardizem with conversion back to normal sinus rhythm around 1500.  Initial labs showed WBC 9.3, Hgb 8.8 platelets 193, Na+ 137, K+ 3.2 and creatinine 9.72. BNP 482. TSH 6.354 and follow-up Free T4 normal at 0.83. Initial Hs troponin 78 with repeat of  80. COVID negative. CXR showing cardiomegaly with no acute findings. EKG showing atrial fibrillation with RVR, HR 129 with LVH and associated repol abnormalities with ST depression along the lateral leads.   He reports feeling back to baseline this morning. Is anxious to go home. Denies any chest pain or palpitations overnight.  Past Medical History:  Diagnosis Date   Anxiety    Cellulitis    CHF (congestive heart failure) (HCC)    Chronic diarrhea    Chronic kidney disease    Chronic pain of both ankles    Diabetes mellitus without complication (HCC)    Diabetes mellitus, type II (HCC)    Diabetic neuropathy (HCC)    Frequent falls    Gait instability    GERD (gastroesophageal reflux disease)    Heart palpitations    History of kidney stones    HLD (hyperlipidemia)    Hypertension    Insomnia    Nausea and vomiting in adult    recurrent    Past Surgical History:  Procedure Laterality Date   AMPUTATION Left 03/16/2016   Procedure: AMPUTATION DIGIT LEFT HALLUX;  Surgeon: Trula Slade, DPM;  Location: [place];  Service: Podiatry;  Laterality: Left;  can start around 5    ARTHROSCOPIC REPAIR ACL Left    AV FISTULA PLACEMENT Right 05/31/2019   Procedure: Brachiocephalic AV fistula creation;  Surgeon: Algernon Huxley, MD;  Location: [name] ORS;  Service: Vascular;  Laterality: Right;   COLONOSCOPY WITH PROPOFOL N/A 10/28/2015   Procedure: COLONOSCOPY WITH PROPOFOL;  Surgeon:  Lollie Sails, MD;  Location: [facility] ENDOSCOPY;  Service: Endoscopy;  Laterality: N/A;   COLONOSCOPY WITH PROPOFOL N/A 10/29/2015   Procedure: COLONOSCOPY WITH PROPOFOL;  Surgeon: Lollie Sails, MD;  Location: [facility] ENDOSCOPY;  Service: Endoscopy;  Laterality: N/A;   CORONARY/GRAFT ACUTE MI REVASCULARIZATION N/A 02/26/2020   Procedure: Coronary/Graft Acute MI Revascularization;  Surgeon: Jettie Booze, MD;  Location: [place] CV LAB;  Service: Cardiovascular;  Laterality: N/A;   DIALYSIS/PERMA CATHETER  INSERTION Right 04/26/2019   Perm Cath    DIALYSIS/PERMA CATHETER INSERTION N/A 04/26/2019   Procedure: DIALYSIS/PERMA CATHETER INSERTION;  Surgeon: Algernon Huxley, MD;  Location: [place] CV LAB;  Service: Cardiovascular;  Laterality: N/A;   DIALYSIS/PERMA CATHETER REMOVAL N/A 08/15/2019   Procedure: DIALYSIS/PERMA CATHETER REMOVAL;  Surgeon: Katha Cabal, MD;  Location: [place] CV LAB;  Service: Cardiovascular;  Laterality: N/A;   ESOPHAGOGASTRODUODENOSCOPY (EGD) WITH PROPOFOL N/A 12/27/2017   Procedure: ESOPHAGOGASTRODUODENOSCOPY (EGD) WITH PROPOFOL;  Surgeon: Toledo, Benay Pike, MD;  Location: [name] ENDOSCOPY;  Service: Gastroenterology;  Laterality: N/A;   INTRAVASCULAR ULTRASOUND/IVUS N/A 02/26/2020   Procedure: Intravascular Ultrasound/IVUS;  Surgeon: Jettie Booze, MD;  Location: [name] CV LAB;  Service: Cardiovascular;  Laterality: N/A;   LEFT HEART CATH AND CORONARY ANGIOGRAPHY N/A 02/26/2020   Procedure: LEFT HEART CATH AND CORONARY ANGIOGRAPHY;  Surgeon: Jettie Booze, MD;  Location: [place] CV LAB;  Service: Cardiovascular;  Laterality: N/A;   PROSTATE SURGERY  2016   TONSILECTOMY/ADENOIDECTOMY WITH MYRINGOTOMY     TONSILLECTOMY     UPPER EXTREMITY ANGIOGRAPHY Right 11/26/2020   Procedure: Upper Extremity Angiography;  Surgeon: Algernon Huxley, MD;  Location: [place] CV LAB;  Service: Cardiovascular;  Laterality: Right;     Home Medications:  Prior to Admission medications   Medication Sig Start Date End Date Taking? Authorizing Provider  amiodarone (PACERONE) 200 MG tablet Take 200 mg twice a day for 1 week and then start taking 200 mg once a day Patient taking differently: Take 200 mg by mouth daily. Take 200 mg twice a day for 1 week and then start taking 200 mg once a day 11/07/20  Yes Milton Ferguson, MD  amoxicillin (AMOXIL) 500 MG capsule Take 500 mg by mouth daily as needed. 11/07/20  Yes [provider]  apixaban (ELIQUIS) 5 MG TABS  tablet Take 1 tablet (5 mg total) by mouth 2 (two) times daily. 11/27/20  Yes Sharen Hones, MD  aspirin 81 MG chewable tablet CHEW 1 TABLET (81 MG TOTAL) BY MOUTH DAILY. 02/28/20 02/27/21 Yes Reino Bellis B, NP  atorvastatin (LIPITOR) 80 MG tablet TAKE 1 TABLET (80 MG TOTAL) BY MOUTH AT BEDTIME. 02/28/20 02/27/21 Yes Cheryln Manly, NP  azelastine (ASTELIN) 0.1 % nasal spray Place 2 sprays into both nostrils 2 (two) times daily. 09/08/20  Yes [provider]  calcium acetate (PHOSLO) 667 MG capsule Take 3 capsules (2,001 mg total) by mouth 3 (three) times daily with meals. 11/27/20  Yes Sharen Hones, MD  carvedilol (COREG) 25 MG tablet Take 25 mg by mouth every morning. 08/15/19  Yes [provider]  clopidogrel (PLAVIX) 75 MG tablet START PLAVIX AND TAKE 300 MG ON 2/7, THEN ON 2/8 TAKE PLAVIX 75 MG DAILY. Patient taking differently: Take 75 mg by mouth daily. START PLAVIX AND TAKE 300 MG ON 2/7, THEN ON 2/8 TAKE PLAVIX 75 MG DAILY. 03/13/20  Yes Kathyrn Drown D, NP  FEROSUL 325 (65 Fe) MG tablet Take 325 mg  by mouth daily. 09/08/20  Yes [provider]  fluticasone (FLONASE) 50 MCG/ACT nasal spray Place 2 sprays into both nostrils daily. 09/08/20  Yes [provider]  ibuprofen (ADVIL) 600 MG tablet Take 600 mg by mouth 3 (three) times daily. 12/04/20  Yes [provider]  lidocaine-prilocaine (EMLA) cream Apply 1 application topically Every Tuesday,Thursday,and Saturday with dialysis.  08/15/19  Yes [provider]  losartan (COZAAR) 25 MG tablet Take 25 mg by mouth daily. 10/21/20  Yes [provider]  nitroGLYCERIN (NITROSTAT) 0.4 MG SL tablet PLACE 1 TABLET (0.4 MG TOTAL) UNDER THE TONGUE EVERY FIVE MINUTES AS NEEDED. Patient taking differently: Place 0.4 mg under the tongue every 5 (five) minutes as needed for chest pain. 02/28/20 02/27/21 Yes Reino Bellis B, NP  oxyCODONE (OXY IR/ROXICODONE) 5 MG immediate release tablet Take 1 tablet by  mouth every 4 (four) hours as needed. 12/04/20  Yes [provider]  prednisoLONE acetate (PRED FORTE) 1 % ophthalmic suspension Place 1 drop into the left eye every 4 (four) hours.   Yes [provider]  tamsulosin (FLOMAX) 0.4 MG CAPS capsule Take 0.4 mg by mouth daily after breakfast.    Yes [provider]  pantoprazole (PROTONIX) 40 MG tablet Take 1 tablet (40 mg total) by mouth daily. Patient not taking: No sig reported 03/13/20 03/13/21  Kathyrn Drown D, NP    Inpatient Medications: Scheduled Meds:  amiodarone  200 mg Oral BID   apixaban  5 mg Oral BID   atorvastatin  80 mg Oral QHS   calcium acetate  2,001 mg Oral TID WC   carvedilol  25 mg Oral BID WC   Chlorhexidine Gluconate Cloth  6 each Topical Daily   clopidogrel  75 mg Oral Daily   ferrous sulfate  325 mg Oral Daily   tamsulosin  0.4 mg Oral QPC breakfast   Continuous Infusions:  PRN Meds: acetaminophen **OR** acetaminophen, azelastine, bisacodyl, nitroGLYCERIN, ondansetron **OR** ondansetron (ZOFRAN) IV, oxyCODONE  Allergies:    Allergies  Allergen Reactions   Levofloxacin Swelling    Of face Other reaction(s): swelling   Promethazine Diarrhea and Other (See Comments)    Other reaction(s): Other (See Comments), Other (See Comments) Other reaction(s): Muscle cramps Muscle cramps Cramping Other reaction(s): Muscle cramps Muscle cramps   Other     Other reaction(s): Unknown   Malt Other (See Comments)    Other reaction(s): cramping    Social History:   Social History   Socioeconomic History   Marital status: Married    Spouse name: [name]    Number of children: 2   Years of education: Not on file   Highest education level: High school graduate  Occupational History   Occupation: Disability    Comment: not employed  Tobacco Use   Smoking status: Never   Smokeless tobacco: Never  Vaping Use   Vaping Use: Never used  Substance and Sexual Activity   Alcohol use: No    Drug use: No   Sexual activity: Yes  Other Topics Concern   Not on file  Social History Narrative   Oldest son killed in car crash June 2020.    Social Determinants of Health   Financial Resource Strain: Not on file  Food Insecurity: Not on file  Transportation Needs: No Transportation Needs   Lack of Transportation (Medical): No   Lack of Transportation (Non-Medical): No  Physical Activity: Not on file  Stress: Not on file  Social Connections: Not on file  Intimate Partner Violence: Not on file    Family History:    Family History  Problem Relation Age of Onset   CAD Father    Stroke Father    Diabetes Mellitus II Mother    Kidney failure Mother    Schizophrenia Mother      ROS:  Please see the history of present illness.   All other ROS reviewed and negative.     Physical Exam/Data:   Vitals:   12/09/20 0500 12/09/20 0600 12/09/20 0700 12/09/20 0712  BP: (!) 149/79 (!) 172/74 (!) 158/71   Pulse: 67 70 70 69  Resp:  '13 10 16  '$ Temp:    99 F (37.2 C)  TempSrc:    Oral  SpO2: 99% 98% 98% 100%  Weight:      Height:        Intake/Output Summary (Last 24 hours) at 12/09/2020 1027 Last data filed at 12/09/2020 [id number] Gross per 24 hour  Intake 647.68 ml  Output --  Net 647.68 ml   Last 3 Weights 12/08/2020 12/08/2020 11/26/2020  Weight (lbs) 201 lb 15.1 oz 200 lb 203 lb 14.8 oz  Weight (kg) 91.6 kg 90.719 kg 92.5 kg  Some encounter information is confidential and restricted. Go to Review Flowsheets activity to see all data.     Body mass index is 25.24 kg/m.  General: Pleasant male appearing in no acute distress. HEENT: normal Neck: no JVD Vascular: No carotid bruits; Distal pulses 2+ bilaterally Cardiac:  normal S1, S2; RRR; no murmur. Lungs:  clear to auscultation bilaterally, no wheezing, rhonchi or rales  Abd: soft, nontender, no hepatomegaly  Ext: 1+ pitting edema bilaterally.  Musculoskeletal:  No deformities, BUE and BLE strength normal and  equal Skin: warm and dry  Neuro:  CNs 2-12 intact, no focal abnormalities noted Psych:  Normal affect   EKG:  The EKG was personally reviewed and demonstrates: Atrial fibrillation with RVR, HR 129 with LVH and associated repol abnormalities with ST depression along the lateral leads.   Telemetry:  Telemetry was personally reviewed and demonstrates:  NSR, HR in 60's to 80's.   Relevant CV Studies:  Cardiac Catheterization: 02/26/2020 The left ventricular systolic function is normal. LV end diastolic pressure is normal. The left ventricular ejection fraction is greater than 65% by visual estimate. There is no aortic valve stenosis. Mid LAD lesion is 90% stenosed. A drug-eluting stent was successfully placed using a STENT RESOLUTE ONYX 3.5X30, postdilated to > 4 mm. Stent optimized with IVUS. Post intervention, there is a 0% residual stenosis.   I suspect his abnormal ECG and anterior injury pattern was more from demand ischemia in the setting of hypotension and AFib with RVR, with severe mid LAD lesion.     During the cath, he converted to NSR briefly and his pulse pressure and BP returned to normal without any pressors. In addition, LVEDP only 11 mm Hg despite having 2.4 L NS administered on transport.  He may have been a little dehydrated after 3 L fluid removal after dialysis.    In regards to AFib.  Continue IV Amiodarone.  Will have to decide if he needs longterm anticoagulation along with antiplatelet therapy.  If so, would change Brilinta to clopidogrel.  Would have to check what options work in the setting of ESRD.   Echocardiogram: 11/25/2020 IMPRESSIONS     1. Left ventricular ejection fraction, by estimation, is 55 to 60%. The  left ventricle has normal function. The left ventricle has  no regional  wall motion abnormalities. The left ventricular internal cavity size was  borderline dilated. There is moderate   left ventricular hypertrophy. Left ventricular diastolic  parameters are  consistent with Grade II diastolic dysfunction (pseudonormalization).  Elevated left atrial pressure. The average left ventricular global  longitudinal strain is -13.0 %. The global  longitudinal strain is abnormal.   2. Right ventricular systolic function is normal. The right ventricular  size is mildly enlarged. Tricuspid regurgitation signal is inadequate for  assessing PA pressure.   3. Left atrial size was mild to moderately dilated.   4. Right atrial size was mildly dilated.   5. There is a small to moderate pericardial effusion. There is no  evidence of cardiac tamponade.   6. The mitral valve is normal in structure. Trivial mitral valve  regurgitation. No evidence of mitral stenosis.   7. The aortic valve is tricuspid. Aortic valve regurgitation is not  visualized. No aortic stenosis is present.   8. Aortic dilatation noted. There is borderline dilatation of the aortic  root, measuring 38 mm.   9. Mildly dilated pulmonary artery.  10. The inferior vena cava is dilated in size with >50% respiratory  variability, suggesting right atrial pressure of 8 mmHg.    Laboratory Data:  High Sensitivity Troponin:   Recent Labs  Lab 11/25/20 0749 11/25/20 1547 11/26/20 1044 12/08/20 1004 12/08/20 1204  TROPONINIHS 1,556* 1,422* 913* 78* 80*     Chemistry Recent Labs  Lab 12/08/20 1004 12/09/20 0430  NA 137 136  K 3.2* 4.3  CL 107 106  CO2 16* 20*  GLUCOSE 68* 87  BUN 37* 51*  CREATININE 9.72* 12.01*  CALCIUM 8.2* 7.7*  MG  --  1.9  GFRNONAA 6* 5*  ANIONGAP 14 10    Recent Labs  Lab 12/08/20 1004  PROT 6.6  ALBUMIN 3.0*  AST 12*  ALT 5  ALKPHOS 75  BILITOT 0.8   Lipids No results for input(s): CHOL, TRIG, HDL, LABVLDL, LDLCALC, CHOLHDL in the last 168 hours.  Hematology Recent Labs  Lab 12/08/20 1004 12/09/20 0430  WBC 9.3 8.4  RBC 3.65* 3.19*  HGB 8.8* 7.6*  HCT 28.0* 24.8*  MCV 76.7* 77.7*  MCH 24.1* 23.8*  MCHC 31.4 30.6  RDW  20.3* 20.3*  PLT 193 172   Thyroid  Recent Labs  Lab 12/08/20 1004 12/09/20 0430  TSH 6.354*  --   FREET4  --  0.83    BNP Recent Labs  Lab 12/08/20 1004  BNP 482.0*    DDimer No results for input(s): DDIMER in the last 168 hours.   Radiology/Studies:  CT Head Wo Contrast  Result Date: 12/08/2020 CLINICAL DATA:  Headache, intracranial hemorrhage suspected EXAM: CT HEAD WITHOUT CONTRAST TECHNIQUE: Contiguous axial images were obtained from the base of the skull through the vertex without intravenous contrast. COMPARISON:  November 24, 2020 FINDINGS: Brain: No evidence of acute infarction, hemorrhage, hydrocephalus, extra-axial collection or mass lesion/mass effect. Vascular: Vascular calcifications. Skull: Normal. Negative for fracture or focal lesion. Sinuses/Orbits: No acute finding. Other: None. IMPRESSION: No acute intracranial abnormality. Electronically Signed   By: Valentino Saxon M.D.   On: 12/08/2020 11:37   DG Chest Port 1 View  Result Date: 12/08/2020 CLINICAL DATA:  chest pain EXAM: PORTABLE CHEST 1 VIEW COMPARISON:  November 24, 2020 FINDINGS: The cardiomediastinal silhouette is unchanged and enlarged in contour. No pleural effusion. No pneumothorax. No acute pleuroparenchymal abnormality. Visualized abdomen is unremarkable. IMPRESSION: Cardiomegaly. Electronically Signed  By: Valentino Saxon M.D.   On: 12/08/2020 11:33     Assessment and Plan:   1. Paroxysmal Atrial Fibrillation - This typically seems to occur after his dialysis sessions and he does have a ZIO monitor in place and is scheduled to mail this back later in the week which will provide additional information in regards to his AF burden. TSH was elevated to 6.354 with follow-up Free T4 being normal. He was started on Amiodarone within the past month but in talking the patient, he has only been taking 200 mg daily and never took this as twice daily dosing. Reviewed with Dr. Domenic Polite and will titrate to 200  mg twice daily for at least 2 weeks and then can reduce to 200 mg daily to assist with his loading dose. He has remained on Coreg 25 mg twice daily and would not further titrate given his hypotension during HD sessions. - He was recently started on Eliquis 5 mg twice daily during his most recent admission and denies any evidence of active bleeding. Will continue to monitor his hemoglobin closely as he does have chronic anemia in the setting of ESRD with hemoglobin at 7.6 this AM.  2. CAD/Elevated Troponin Values - He is s/p anterior STEMI in 02/2020 with DES to mid-LAD. His cardiac enzymes have been flat at 78 and 80 this admission which is much improved as compared to peaking at 1556 during his admission earlier this month. It was recommended that he have a [employer] as an outpatient and given his flat trend this admission, anticipate this can still be obtained as an outpatient and he prefers this. - Remains on Lipitor 80 mg daily, Plavix 75 mg daily and Coreg 25 mg twice daily.   3. HFpEF - Recent echocardiogram showed preserved EF 55 to 60% with no regional wall motion abnormalities. He did have moderate LVH and grade 2 diastolic dysfunction. Lungs are clear on examination but he does have 1+ pitting edema. BNP was elevated at 42 on admission. Volume management per HD as he reports minimal urine output at baseline.   4. Pericardial Effusion - Recent echocardiogram earlier this month showed a small to moderate pericardial effusion with no evidence of tamponade.  Would continue to follow as an outpatient.  5. HLD - LDL was at 61 in 11/2020.  Continue atorvastatin 80 mg daily with goal LDL less than 70 in the setting of known CAD and PAD.  6. Chronic Anemia - Baseline Hgb 8.0 - 9.0. At 7.6 this AM. Continue to follow. At increased risk of bleeding given the need for antiplatelet therapy and also being on Eliquis due to his atrial fibrillation.   Risk Assessment/Risk Scores:     CHA2DS2-VASc Score = 3   This indicates a 3.2% annual risk of stroke. The patient's score is based upon: CHF History: 1 HTN History: 1 Diabetes History: 0 Stroke History: 0 Vascular Disease History: 1 Age Score: 0 Gender Score: 0    For questions or updates, please contact [place] Please consult [email or web address] for contact info under    Signed, Erma Heritage, PA-C  12/09/2020 10:27 AM   Attending note:  Patient seen and examined.  I reviewed his records and discussed the case with the Delano Metz, I agree with her above findings.  Mr. Alwardt is currently admitted to the hospital with an episode of symptomatic, recurrent atrial fibrillation with RVR.  Recent hospitalization at [facility] noted in early October as described above.  Wearing Zio  patch at present.  On examination this morning he appears comfortable, no chest pain, he is in sinus rhythm by telemetry which I personally reviewed.  Afebrile, heart rate is in the 0000000 to [id number], systolic blood pressure Q000111Q to 150s.  Lungs are clear.  Cardiac exam with RRR and 1/6 systolic murmur.  No gallop.  Pertinent lab work includes potassium 4.3, BUN 51, creatinine 12.09, high-sensitivity troponin I levels flat in the 70s to 80s, BNP 42, hemoglobin 7.6, platelets 172, SARS coronavirus 2 test negative.  Chest x-ray reports cardiomegaly without infiltrates or edema.  I personally reviewed his ECG from October 17 showing rapid atrial fibrillation with LVH and repolarization abnormalities.  Patient presents with symptomatic paroxysmal atrial fibrillation, CHA2DS2-VASc score is 3.  Plan is to increase amiodarone to 200 mg twice daily for the next 2 weeks and then reduce back to once daily.  Continue Eliquis for stroke prophylaxis with close follow-up of hemoglobin in the setting of chronic anemia with ESRD.  No clear evidence of ACS at this point.  As discussed above he had been considered for a follow-up Lexiscan Myoview after his  [facility] admission - no change in plan for now.  Would continue present ischemic regimen including Coreg, Lipitor, and Plavix otherwise.  Also wearing Zio patch, would continue.  No further inpatient cardiac testing planned.  Satira Sark, M.D., F.A.C.C.

## 2020-12-09 NOTE — Progress Notes (Signed)
Patient discharged to home. IV access removed, patient tolerated well. Removed telemetry cables and patient with wifes help is getting dress and will be taken down by wheel chair.

## 2020-12-09 NOTE — Consult Note (Signed)
[facility] Renal Consultation Note    Indication for Consultation:  Management of ESRD/hemodialysis; anemia, hypertension/volume and secondary hyperparathyroidism  HPI: [name] Breydon Brum. is a 50 y.o. male with a PMH significant for CAD, paroxysmal atrial fibrillation, chronic diastolic CHF, HTN, HLD, GERD, medical noncompliance, and ESRD on HD MWF at [facility] (but he admits that he only goes on Mondays and Fridays) who went to HD yesterday and developed weakness and malaise.  In the ED he was tachycardic at 133 and ECG consistent with Atrial fibrillation and RVR.  He was started on cardizem drip and admitted to the ICU.  We were consulted to provide dialysis during his hospitalization.  Of note, he had a similar admission on 11/24/20 at [facility] when he presented after HD with pre-syncope.  Cardiology offered workup but he refused to stay in the hospital for workup and was to be seen as an outpatient.  He reports that this has been a recurring issue with dialysis which is why he only goes twice a week because he thinks dialysis is the cause of his problems.  Past Medical History:  Diagnosis Date   Anxiety    Cellulitis    CHF (congestive heart failure) (HCC)    Chronic diarrhea    Chronic kidney disease    Chronic pain of both ankles    Diabetes mellitus without complication (HCC)    Diabetes mellitus, type II (HCC)    Diabetic neuropathy (HCC)    Frequent falls    Gait instability    GERD (gastroesophageal reflux disease)    Heart palpitations    History of kidney stones    HLD (hyperlipidemia)    Hypertension    Insomnia    Nausea and vomiting in adult    recurrent   Past Surgical History:  Procedure Laterality Date   AMPUTATION Left 03/16/2016   Procedure: AMPUTATION DIGIT LEFT HALLUX;  Surgeon: Trula Slade, DPM;  Location: [place];  Service: Podiatry;  Laterality: Left;  can start around 5    ARTHROSCOPIC REPAIR ACL Left    AV FISTULA PLACEMENT Right  05/31/2019   Procedure: Brachiocephalic AV fistula creation;  Surgeon: Algernon Huxley, MD;  Location: [name] ORS;  Service: Vascular;  Laterality: Right;   COLONOSCOPY WITH PROPOFOL N/A 10/28/2015   Procedure: COLONOSCOPY WITH PROPOFOL;  Surgeon: Lollie Sails, MD;  Location: [facility] ENDOSCOPY;  Service: Endoscopy;  Laterality: N/A;   COLONOSCOPY WITH PROPOFOL N/A 10/29/2015   Procedure: COLONOSCOPY WITH PROPOFOL;  Surgeon: Lollie Sails, MD;  Location: [facility] ENDOSCOPY;  Service: Endoscopy;  Laterality: N/A;   CORONARY/GRAFT ACUTE MI REVASCULARIZATION N/A 02/26/2020   Procedure: Coronary/Graft Acute MI Revascularization;  Surgeon: Jettie Booze, MD;  Location: [place] CV LAB;  Service: Cardiovascular;  Laterality: N/A;   DIALYSIS/PERMA CATHETER INSERTION Right 04/26/2019   Perm Cath    DIALYSIS/PERMA CATHETER INSERTION N/A 04/26/2019   Procedure: DIALYSIS/PERMA CATHETER INSERTION;  Surgeon: Algernon Huxley, MD;  Location: [place] CV LAB;  Service: Cardiovascular;  Laterality: N/A;   DIALYSIS/PERMA CATHETER REMOVAL N/A 08/15/2019   Procedure: DIALYSIS/PERMA CATHETER REMOVAL;  Surgeon: Katha Cabal, MD;  Location: [place] CV LAB;  Service: Cardiovascular;  Laterality: N/A;   ESOPHAGOGASTRODUODENOSCOPY (EGD) WITH PROPOFOL N/A 12/27/2017   Procedure: ESOPHAGOGASTRODUODENOSCOPY (EGD) WITH PROPOFOL;  Surgeon: Toledo, Benay Pike, MD;  Location: [name] ENDOSCOPY;  Service: Gastroenterology;  Laterality: N/A;   INTRAVASCULAR ULTRASOUND/IVUS N/A 02/26/2020   Procedure: Intravascular Ultrasound/IVUS;  Surgeon: Jettie Booze, MD;  Location: [place] CV LAB;  Service: Cardiovascular;  Laterality: N/A;   LEFT HEART CATH AND CORONARY ANGIOGRAPHY N/A 02/26/2020   Procedure: LEFT HEART CATH AND CORONARY ANGIOGRAPHY;  Surgeon: Jettie Booze, MD;  Location: [place] CV LAB;  Service: Cardiovascular;  Laterality: N/A;   PROSTATE SURGERY  2016   TONSILECTOMY/ADENOIDECTOMY WITH MYRINGOTOMY      TONSILLECTOMY     UPPER EXTREMITY ANGIOGRAPHY Right 11/26/2020   Procedure: Upper Extremity Angiography;  Surgeon: Algernon Huxley, MD;  Location: [place] CV LAB;  Service: Cardiovascular;  Laterality: Right;   Family History:   Family History  Problem Relation Age of Onset   CAD Father    Stroke Father    Diabetes Mellitus II Mother    Kidney failure Mother    Schizophrenia Mother    Social History:  reports that he has never smoked. He has never used smokeless tobacco. He reports that he does not drink alcohol and does not use drugs. Allergies  Allergen Reactions   Levofloxacin Swelling    Of face Other reaction(s): swelling   Promethazine Diarrhea and Other (See Comments)    Other reaction(s): Other (See Comments), Other (See Comments) Other reaction(s): Muscle cramps Muscle cramps Cramping Other reaction(s): Muscle cramps Muscle cramps   Other     Other reaction(s): Unknown   Malt Other (See Comments)    Other reaction(s): cramping   Prior to Admission medications   Medication Sig Start Date End Date Taking? Authorizing Provider  amiodarone (PACERONE) 200 MG tablet Take 200 mg twice a day for 1 week and then start taking 200 mg once a day Patient taking differently: Take 200 mg by mouth daily. Take 200 mg twice a day for 1 week and then start taking 200 mg once a day 11/07/20  Yes Milton Ferguson, MD  amoxicillin (AMOXIL) 500 MG capsule Take 500 mg by mouth daily as needed. 11/07/20  Yes [provider]  apixaban (ELIQUIS) 5 MG TABS tablet Take 1 tablet (5 mg total) by mouth 2 (two) times daily. 11/27/20  Yes Sharen Hones, MD  aspirin 81 MG chewable tablet CHEW 1 TABLET (81 MG TOTAL) BY MOUTH DAILY. 02/28/20 02/27/21 Yes Reino Bellis B, NP  atorvastatin (LIPITOR) 80 MG tablet TAKE 1 TABLET (80 MG TOTAL) BY MOUTH AT BEDTIME. 02/28/20 02/27/21 Yes Cheryln Manly, NP  azelastine (ASTELIN) 0.1 % nasal spray Place 2 sprays into both nostrils 2 (two) times daily.  09/08/20  Yes [provider]  calcium acetate (PHOSLO) 667 MG capsule Take 3 capsules (2,001 mg total) by mouth 3 (three) times daily with meals. 11/27/20  Yes Sharen Hones, MD  carvedilol (COREG) 25 MG tablet Take 25 mg by mouth every morning. 08/15/19  Yes [provider]  clopidogrel (PLAVIX) 75 MG tablet START PLAVIX AND TAKE 300 MG ON 2/7, THEN ON 2/8 TAKE PLAVIX 75 MG DAILY. Patient taking differently: Take 75 mg by mouth daily. START PLAVIX AND TAKE 300 MG ON 2/7, THEN ON 2/8 TAKE PLAVIX 75 MG DAILY. 03/13/20  Yes Kathyrn Drown D, NP  FEROSUL 325 (65 Fe) MG tablet Take 325 mg by mouth daily. 09/08/20  Yes [provider]  fluticasone (FLONASE) 50 MCG/ACT nasal spray Place 2 sprays into both nostrils daily. 09/08/20  Yes [provider]  ibuprofen (ADVIL) 600 MG tablet Take 600 mg by mouth 3 (three) times daily. 12/04/20  Yes [provider]  lidocaine-prilocaine (EMLA) cream Apply 1 application topically Every Tuesday,Thursday,and Saturday with  dialysis.  08/15/19  Yes [provider]  losartan (COZAAR) 25 MG tablet Take 25 mg by mouth daily. 10/21/20  Yes [provider]  nitroGLYCERIN (NITROSTAT) 0.4 MG SL tablet PLACE 1 TABLET (0.4 MG TOTAL) UNDER THE TONGUE EVERY FIVE MINUTES AS NEEDED. Patient taking differently: Place 0.4 mg under the tongue every 5 (five) minutes as needed for chest pain. 02/28/20 02/27/21 Yes Reino Bellis B, NP  oxyCODONE (OXY IR/ROXICODONE) 5 MG immediate release tablet Take 1 tablet by mouth every 4 (four) hours as needed. 12/04/20  Yes [provider]  prednisoLONE acetate (PRED FORTE) 1 % ophthalmic suspension Place 1 drop into the left eye every 4 (four) hours.   Yes [provider]  tamsulosin (FLOMAX) 0.4 MG CAPS capsule Take 0.4 mg by mouth daily after breakfast.    Yes [provider]  pantoprazole (PROTONIX) 40 MG tablet Take 1 tablet (40 mg total) by mouth daily. Patient not  taking: No sig reported 03/13/20 03/13/21  Tommie Raymond, NP   Current Facility-Administered Medications  Medication Dose Route Frequency Provider Last Rate Last Admin   acetaminophen (TYLENOL) tablet 650 mg  650 mg Oral Q6H PRN Wynetta Emery, Clanford L, MD   650 mg at 12/09/20 [id number]   Or   acetaminophen (TYLENOL) suppository 650 mg  650 mg Rectal Q6H PRN Wynetta Emery, Clanford L, MD       amiodarone (PACERONE) tablet 200 mg  200 mg Oral Daily Johnson, Clanford L, MD   200 mg at 12/08/20 1345   apixaban (ELIQUIS) tablet 5 mg  5 mg Oral BID Wynetta Emery, Clanford L, MD   5 mg at 12/08/20 2046   atorvastatin (LIPITOR) tablet 80 mg  80 mg Oral QHS Johnson, Clanford L, MD   80 mg at 12/08/20 2046   azelastine (ASTELIN) 0.1 % nasal spray 2 spray  2 spray Each Nare BID PRN Johnson, Clanford L, MD       bisacodyl (DULCOLAX) EC tablet 5 mg  5 mg Oral Daily PRN Wynetta Emery, Clanford L, MD       calcium acetate (PHOSLO) capsule 2,001 mg  2,001 mg Oral TID WC Johnson, Clanford L, MD   2,001 mg at 12/09/20 0829   carvedilol (COREG) tablet 25 mg  25 mg Oral BID WC Johnson, Clanford L, MD   25 mg at 12/09/20 [id number]   Chlorhexidine Gluconate Cloth 2 % PADS 6 each  6 each Topical Daily Johnson, Clanford L, MD       clopidogrel (PLAVIX) tablet 75 mg  75 mg Oral Daily Johnson, Clanford L, MD   75 mg at 12/08/20 1511   ferrous sulfate tablet 325 mg  325 mg Oral Daily Johnson, Clanford L, MD   325 mg at 12/08/20 1512   nitroGLYCERIN (NITROSTAT) SL tablet 0.4 mg  0.4 mg Sublingual Q5 min PRN Johnson, Clanford L, MD       ondansetron (ZOFRAN) tablet 4 mg  4 mg Oral Q6H PRN Johnson, Clanford L, MD       Or   ondansetron (ZOFRAN) injection 4 mg  4 mg Intravenous Q6H PRN Johnson, Clanford L, MD       oxyCODONE (Oxy IR/ROXICODONE) immediate release tablet 5 mg  5 mg Oral Q4H PRN Johnson, Clanford L, MD       tamsulosin (FLOMAX) capsule 0.4 mg  0.4 mg Oral QPC breakfast Murlean Iba, MD       Labs: Basic Metabolic Panel: Recent  Labs  Lab 12/08/20 1004 12/09/20 0430  NA 137 136  K 3.2* 4.3  CL 107 106  CO2 16* 20*  GLUCOSE 68* 87  BUN 37* 51*  CREATININE 9.72* 12.01*  CALCIUM 8.2* 7.7*   Liver Function Tests: Recent Labs  Lab 12/08/20 1004  AST 12*  ALT 5  ALKPHOS 75  BILITOT 0.8  PROT 6.6  ALBUMIN 3.0*   No results for input(s): LIPASE, AMYLASE in the last 168 hours. No results for input(s): AMMONIA in the last 168 hours. CBC: Recent Labs  Lab 12/08/20 1004 12/09/20 0430  WBC 9.3 8.4  NEUTROABS 6.8  --   HGB 8.8* 7.6*  HCT 28.0* 24.8*  MCV 76.7* 77.7*  PLT 193 172   Cardiac Enzymes: No results for input(s): CKTOTAL, CKMB, CKMBINDEX, TROPONINI in the last 168 hours. CBG: Recent Labs  Lab 12/08/20 1737  GLUCAP 92   Iron Studies: No results for input(s): IRON, TIBC, TRANSFERRIN, FERRITIN in the last 72 hours. Studies/Results: CT Head Wo Contrast  Result Date: 12/08/2020 CLINICAL DATA:  Headache, intracranial hemorrhage suspected EXAM: CT HEAD WITHOUT CONTRAST TECHNIQUE: Contiguous axial images were obtained from the base of the skull through the vertex without intravenous contrast. COMPARISON:  November 24, 2020 FINDINGS: Brain: No evidence of acute infarction, hemorrhage, hydrocephalus, extra-axial collection or mass lesion/mass effect. Vascular: Vascular calcifications. Skull: Normal. Negative for fracture or focal lesion. Sinuses/Orbits: No acute finding. Other: None. IMPRESSION: No acute intracranial abnormality. Electronically Signed   By: Valentino Saxon M.D.   On: 12/08/2020 11:37   DG Chest Port 1 View  Result Date: 12/08/2020 CLINICAL DATA:  chest pain EXAM: PORTABLE CHEST 1 VIEW COMPARISON:  November 24, 2020 FINDINGS: The cardiomediastinal silhouette is unchanged and enlarged in contour. No pleural effusion. No pneumothorax. No acute pleuroparenchymal abnormality. Visualized abdomen is unremarkable. IMPRESSION: Cardiomegaly. Electronically Signed   By: Valentino Saxon M.D.    On: 12/08/2020 11:33    ROS: Pertinent items are noted in HPI. Physical Exam: Vitals:   12/09/20 0500 12/09/20 0600 12/09/20 0700 12/09/20 0712  BP: (!) 149/79 (!) 172/74 (!) 158/71   Pulse: 67 70 70 69  Resp:  '13 10 16  '$ Temp:    99 F (37.2 C)  TempSrc:    Oral  SpO2: 99% 98% 98% 100%  Weight:      Height:          Weight change:   Intake/Output Summary (Last 24 hours) at 12/09/2020 0933 Last data filed at 12/09/2020 [id number] Gross per 24 hour  Intake 647.68 ml  Output --  Net 647.68 ml   BP (!) 158/71   Pulse 69   Temp 99 F (37.2 C) (Oral)   Resp 16   Ht '6\' 3"'$  (1.905 m)   Wt 91.6 kg   SpO2 100%   BMI 25.24 kg/m  General appearance: alert, cooperative, and no distress Head: Normocephalic, without obvious abnormality, atraumatic Resp: clear to auscultation bilaterally Cardio: regular rate and rhythm and no rub GI: soft, non-tender; bowel sounds normal; no masses,  no organomegaly Extremities: no edema, RUE AVF +T/B, 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 EDW 88.5kg HD [place] 2K/2.5Ca  Time 3:45 Heparin 1800 units IVP bolus then 400 units/hr. Access RAVF BFR 400 DFR 500    Epogen 8400 units IVP every Mon and Fri Assessment/Plan:  Atrial Fibrillation with RVR - has been a recurring issue and hospitalized earlier this month at [facility] but he declined cardiac workup.  Discussed that his atrial fibrillation needs to be evaluated as it is interfering with his ability to tolerate dialysis.  ESRD -  only goes twice per week and s/o early due to cramping.  Discussed risks of noncompliance.   Will hold off on HD tomorrow as he does not want it.  Hypertension/volume  - stable   Anemia  - will dose with Sq Aranesp as he likely misses ESA doses  Metabolic bone disease -   resume home meds  Nutrition -  renal diet Medical Noncompliance - skips HD once a week and s/o early.  Discussed risks of shortening and  skipping HD.  Donetta Potts, MD [place] Pager [phone] 12/09/2020, 9:33 AM

## 2020-12-23 NOTE — Progress Notes (Deleted)
Cardiology Office Note    Date:  12/23/2020   ID:  [name] Cooper., DOB 25-Mar-1970, MRN [id number]   PCP:  [name], MD   [place]  Cardiologist:  Larae Grooms, MD   Advanced Practice Provider:  No care team member to display Electrophysiologist:  None   [phone]   No chief complaint on file.   History of Present Illness:  [name] Theotis Gerdeman. is a 50 y.o. male with a hx of CAD (s/p anterior STEMI in 02/2020 with DES to mid-LAD), paroxysmal atrial fibrillation, HFpEF, pericardial effusion, ESRD, HTN, HLD, dry gangrene, chronic anemia, GERD and medical noncompliance  Patient admitted to [facility] from 10/3 - 11/27/2020 for evaluation of presyncope and confusion while at HD. Was found to be in atrial fibrillation with RVR upon arrival and received IV Cardizem and converted back to NSR. His Hs Troponin values peaked at 1556 which was felt to be secondary to demand ischemia as he denied any recent anginal symptoms and echo that admission showed a preserved EF of 55-60% with no regional WMA. Was recommended he have a Lexiscan Myoview due to NSVT during admission but the patient declined and wanted to have the test as an outpatient. Zio was placed at discharge. He was discharged on Amiodarone 200mg  BID for 1 week followed by 200mg  daily along with being placed on Eliquis for anticoagulation.    Patient seen 12/09/2020 for the evaluation of recurrent atrial fibrillation with RVR following HD and converted back to normal sinus rhythm with IV diltiazem.    Past Medical History:  Diagnosis Date   Anxiety    Cellulitis    CHF (congestive heart failure) (HCC)    Chronic diarrhea    Chronic kidney disease    Chronic pain of both ankles    Diabetes mellitus without complication (HCC)    Diabetes mellitus, type II (HCC)    Diabetic neuropathy (HCC)    Frequent falls    Gait instability    GERD (gastroesophageal reflux disease)    Heart palpitations     History of kidney stones    HLD (hyperlipidemia)    Hypertension    Insomnia    Nausea and vomiting in adult    recurrent    Past Surgical History:  Procedure Laterality Date   AMPUTATION Left 03/16/2016   Procedure: AMPUTATION DIGIT LEFT HALLUX;  Surgeon: Trula Slade, DPM;  Location: [place];  Service: Podiatry;  Laterality: Left;  can start around 5    ARTHROSCOPIC REPAIR ACL Left    AV FISTULA PLACEMENT Right 05/31/2019   Procedure: Brachiocephalic AV fistula creation;  Surgeon: Algernon Huxley, MD;  Location: [name] ORS;  Service: Vascular;  Laterality: Right;   COLONOSCOPY WITH PROPOFOL N/A 10/28/2015   Procedure: COLONOSCOPY WITH PROPOFOL;  Surgeon: Lollie Sails, MD;  Location: [facility] ENDOSCOPY;  Service: Endoscopy;  Laterality: N/A;   COLONOSCOPY WITH PROPOFOL N/A 10/29/2015   Procedure: COLONOSCOPY WITH PROPOFOL;  Surgeon: Lollie Sails, MD;  Location: [facility] ENDOSCOPY;  Service: Endoscopy;  Laterality: N/A;   CORONARY/GRAFT ACUTE MI REVASCULARIZATION N/A 02/26/2020   Procedure: Coronary/Graft Acute MI Revascularization;  Surgeon: Jettie Booze, MD;  Location: [place] CV LAB;  Service: Cardiovascular;  Laterality: N/A;   DIALYSIS/PERMA CATHETER INSERTION Right 04/26/2019   Perm Cath    DIALYSIS/PERMA CATHETER INSERTION N/A 04/26/2019   Procedure: DIALYSIS/PERMA CATHETER INSERTION;  Surgeon: Algernon Huxley, MD;  Location: [place] CV LAB;  Service: Cardiovascular;  Laterality:  N/A;   DIALYSIS/PERMA CATHETER REMOVAL N/A 08/15/2019   Procedure: DIALYSIS/PERMA CATHETER REMOVAL;  Surgeon: Katha Cabal, MD;  Location: [place] CV LAB;  Service: Cardiovascular;  Laterality: N/A;   ESOPHAGOGASTRODUODENOSCOPY (EGD) WITH PROPOFOL N/A 12/27/2017   Procedure: ESOPHAGOGASTRODUODENOSCOPY (EGD) WITH PROPOFOL;  Surgeon: Toledo, Benay Pike, MD;  Location: [name] ENDOSCOPY;  Service: Gastroenterology;  Laterality: N/A;   INTRAVASCULAR ULTRASOUND/IVUS N/A 02/26/2020   Procedure:  Intravascular Ultrasound/IVUS;  Surgeon: Jettie Booze, MD;  Location: [place] CV LAB;  Service: Cardiovascular;  Laterality: N/A;   LEFT HEART CATH AND CORONARY ANGIOGRAPHY N/A 02/26/2020   Procedure: LEFT HEART CATH AND CORONARY ANGIOGRAPHY;  Surgeon: Jettie Booze, MD;  Location: [place] CV LAB;  Service: Cardiovascular;  Laterality: N/A;   PROSTATE SURGERY  2016   TONSILECTOMY/ADENOIDECTOMY WITH MYRINGOTOMY     TONSILLECTOMY     UPPER EXTREMITY ANGIOGRAPHY Right 11/26/2020   Procedure: Upper Extremity Angiography;  Surgeon: Algernon Huxley, MD;  Location: [place] CV LAB;  Service: Cardiovascular;  Laterality: Right;    Current Medications: No outpatient medications have been marked as taking for the 12/30/20 encounter (Appointment) with Imogene Burn, PA-C.     Allergies:   Levofloxacin, Promethazine, Other, and Malt   Social History   Socioeconomic History   Marital status: Married    Spouse name: [name]    Number of children: 2   Years of education: Not on file   Highest education level: High school graduate  Occupational History   Occupation: Disability    Comment: not employed  Tobacco Use   Smoking status: Never   Smokeless tobacco: Never  Vaping Use   Vaping Use: Never used  Substance and Sexual Activity   Alcohol use: No   Drug use: No   Sexual activity: Yes  Other Topics Concern   Not on file  Social History Narrative   Oldest son killed in car crash June 2020.    Social Determinants of Health   Financial Resource Strain: Not on file  Food Insecurity: Not on file  Transportation Needs: No Transportation Needs   Lack of Transportation (Medical): No   Lack of Transportation (Non-Medical): No  Physical Activity: Not on file  Stress: Not on file  Social Connections: Not on file     Family History:  The patient's ***family history includes CAD in his father; Diabetes Mellitus II in his mother; Kidney failure in his mother;  Schizophrenia in his mother; Stroke in his father.   ROS:   Please see the history of present illness.    ROS All other systems reviewed and are negative.   PHYSICAL EXAM:   VS:  There were no vitals taken for this visit.  Physical Exam  GEN: Well nourished, well developed, in no acute distress  HEENT: normal  Neck: no JVD, carotid bruits, or masses Cardiac:RRR; no murmurs, rubs, or gallops  Respiratory:  clear to auscultation bilaterally, normal work of breathing GI: soft, nontender, nondistended, + BS Ext: without cyanosis, clubbing, or edema, Good distal pulses bilaterally MS: no deformity or atrophy  Skin: warm and dry, no rash Neuro:  Alert and Oriented x 3, Strength and sensation are intact Psych: euthymic mood, full affect  Wt Readings from Last 3 Encounters:  12/08/20 201 lb 15.1 oz (91.6 kg)  11/26/20 203 lb 14.8 oz (92.5 kg)  11/07/20 200 lb (90.7 kg)      Studies/Labs Reviewed:   EKG:  EKG is*** ordered today.  The ekg  ordered today demonstrates ***  Recent Labs: 12/08/2020: ALT 5; B Natriuretic Peptide 482.0; TSH 6.354 12/09/2020: BUN 51; Creatinine, Ser 12.01; Hemoglobin 7.6; Magnesium 1.9; Platelets 172; Potassium 4.3; Sodium 136   Lipid Panel    Component Value Date/Time   CHOL 122 11/26/2020 0257   TRIG 78 11/26/2020 0257   HDL 45 11/26/2020 0257   CHOLHDL 2.7 11/26/2020 0257   VLDL 16 11/26/2020 0257   LDLCALC 61 11/26/2020 0257   LDLCALC 102 (H) 04/25/2019 1159    Additional studies/ records that were reviewed today include:  ***   Risk Assessment/Calculations:   {Does this patient have ATRIAL FIBRILLATION?:[phone]}     ASSESSMENT:    No diagnosis found.   PLAN:  In order of problems listed above:  CAD ant STEMI 02/2020 DES mLAD  Afib on amiodarone and eliquis  NSVT-needs lexiscan echo normal LVEF no WMA  Chronic diastolic CHF managed with dialysis  HTN  HLD  ESRD on HD  Shared Decision Making/Informed Consent    {Are you ordering a CV Procedure (e.g. stress test, cath, DCCV, TEE, etc)?   Press F2        :[id number]}    Medication Adjustments/Labs and Tests Ordered: Current medicines are reviewed at length with the patient today.  Concerns regarding medicines are outlined above.  Medication changes, Labs and Tests ordered today are listed in the Patient Instructions below. There are no Patient Instructions on file for this visit.   Sumner Boast, PA-C  12/23/2020 3:30 PM    [facility] [place], [place], [place]  [address] Phone: [phone]; Fax: [phone]

## 2020-12-30 ENCOUNTER — Ambulatory Visit: Payer: Medicare Other | Admitting: Physician Assistant

## 2020-12-30 DIAGNOSIS — I5032 Chronic diastolic (congestive) heart failure: Secondary | ICD-10-CM

## 2020-12-30 DIAGNOSIS — E7849 Other hyperlipidemia: Secondary | ICD-10-CM

## 2020-12-30 DIAGNOSIS — I10 Essential (primary) hypertension: Secondary | ICD-10-CM

## 2020-12-30 DIAGNOSIS — I480 Paroxysmal atrial fibrillation: Secondary | ICD-10-CM

## 2020-12-30 DIAGNOSIS — N186 End stage renal disease: Secondary | ICD-10-CM

## 2020-12-30 DIAGNOSIS — I2510 Atherosclerotic heart disease of native coronary artery without angina pectoris: Secondary | ICD-10-CM

## 2020-12-30 DIAGNOSIS — I4729 Other ventricular tachycardia: Secondary | ICD-10-CM

## 2021-01-08 ENCOUNTER — Other Ambulatory Visit: Payer: Self-pay

## 2021-01-08 ENCOUNTER — Observation Stay (HOSPITAL_COMMUNITY)
Admission: EM | Admit: 2021-01-08 | Discharge: 2021-01-09 | Disposition: A | Discharge status: Home / Self Care | Payer: Medicare Other | Attending: Internal Medicine | Admitting: Internal Medicine

## 2021-01-08 ENCOUNTER — Emergency Department (HOSPITAL_COMMUNITY): Payer: Medicare Other

## 2021-01-08 DIAGNOSIS — E876 Hypokalemia: Secondary | ICD-10-CM | POA: Diagnosis present

## 2021-01-08 DIAGNOSIS — E11319 Type 2 diabetes mellitus with unspecified diabetic retinopathy without macular edema: Secondary | ICD-10-CM | POA: Diagnosis present

## 2021-01-08 DIAGNOSIS — I5032 Chronic diastolic (congestive) heart failure: Secondary | ICD-10-CM | POA: Diagnosis not present

## 2021-01-08 DIAGNOSIS — I132 Hypertensive heart and chronic kidney disease with heart failure and with stage 5 chronic kidney disease, or end stage renal disease: Secondary | ICD-10-CM | POA: Diagnosis not present

## 2021-01-08 DIAGNOSIS — E1165 Type 2 diabetes mellitus with hyperglycemia: Secondary | ICD-10-CM

## 2021-01-08 DIAGNOSIS — Z992 Dependence on renal dialysis: Secondary | ICD-10-CM | POA: Insufficient documentation

## 2021-01-08 DIAGNOSIS — R9431 Abnormal electrocardiogram [ECG] [EKG]: Secondary | ICD-10-CM

## 2021-01-08 DIAGNOSIS — I4891 Unspecified atrial fibrillation: Secondary | ICD-10-CM | POA: Diagnosis present

## 2021-01-08 DIAGNOSIS — E1169 Type 2 diabetes mellitus with other specified complication: Secondary | ICD-10-CM

## 2021-01-08 DIAGNOSIS — E1122 Type 2 diabetes mellitus with diabetic chronic kidney disease: Secondary | ICD-10-CM | POA: Insufficient documentation

## 2021-01-08 DIAGNOSIS — Z7902 Long term (current) use of antithrombotics/antiplatelets: Secondary | ICD-10-CM | POA: Insufficient documentation

## 2021-01-08 DIAGNOSIS — Z79899 Other long term (current) drug therapy: Secondary | ICD-10-CM | POA: Diagnosis not present

## 2021-01-08 DIAGNOSIS — R400 Somnolence: Secondary | ICD-10-CM

## 2021-01-08 DIAGNOSIS — R55 Syncope and collapse: Secondary | ICD-10-CM | POA: Diagnosis not present

## 2021-01-08 DIAGNOSIS — Z20822 Contact with and (suspected) exposure to covid-19: Secondary | ICD-10-CM | POA: Insufficient documentation

## 2021-01-08 DIAGNOSIS — R778 Other specified abnormalities of plasma proteins: Secondary | ICD-10-CM | POA: Diagnosis not present

## 2021-01-08 DIAGNOSIS — N186 End stage renal disease: Secondary | ICD-10-CM | POA: Diagnosis not present

## 2021-01-08 DIAGNOSIS — R4182 Altered mental status, unspecified: Secondary | ICD-10-CM | POA: Diagnosis present

## 2021-01-08 DIAGNOSIS — Z794 Long term (current) use of insulin: Secondary | ICD-10-CM | POA: Diagnosis not present

## 2021-01-08 DIAGNOSIS — Z7901 Long term (current) use of anticoagulants: Secondary | ICD-10-CM | POA: Insufficient documentation

## 2021-01-08 DIAGNOSIS — I10 Essential (primary) hypertension: Secondary | ICD-10-CM | POA: Diagnosis present

## 2021-01-08 LAB — I-STAT CHEM 8, ED
BUN: 34 mg/dL — ABNORMAL HIGH (ref 6–20)
Calcium, Ion: 0.94 mmol/L — ABNORMAL LOW (ref 1.15–1.40)
Chloride: 105 mmol/L (ref 98–111)
Creatinine, Ser: 10.1 mg/dL — ABNORMAL HIGH (ref 0.61–1.24)
Glucose, Bld: 130 mg/dL — ABNORMAL HIGH (ref 70–99)
HCT: 26 % — ABNORMAL LOW (ref 39.0–52.0)
Hemoglobin: 8.8 g/dL — ABNORMAL LOW (ref 13.0–17.0)
Potassium: 2.5 mmol/L — CL (ref 3.5–5.1)
Sodium: 138 mmol/L (ref 135–145)
TCO2: 18 mmol/L — ABNORMAL LOW (ref 22–32)

## 2021-01-08 LAB — MAGNESIUM: Magnesium: 1.8 mg/dL (ref 1.7–2.4)

## 2021-01-08 LAB — DIFFERENTIAL
Abs Immature Granulocytes: 0.08 10*3/uL — ABNORMAL HIGH (ref 0.00–0.07)
Basophils Absolute: 0 10*3/uL (ref 0.0–0.1)
Basophils Relative: 1 %
Eosinophils Absolute: 0.2 10*3/uL (ref 0.0–0.5)
Eosinophils Relative: 3 %
Immature Granulocytes: 1 %
Lymphocytes Relative: 11 %
Lymphs Abs: 0.6 10*3/uL — ABNORMAL LOW (ref 0.7–4.0)
Monocytes Absolute: 1.1 10*3/uL — ABNORMAL HIGH (ref 0.1–1.0)
Monocytes Relative: 19 %
Neutro Abs: 3.8 10*3/uL (ref 1.7–7.7)
Neutrophils Relative %: 65 %

## 2021-01-08 LAB — CBC
HCT: 24.9 % — ABNORMAL LOW (ref 39.0–52.0)
Hemoglobin: 7.6 g/dL — ABNORMAL LOW (ref 13.0–17.0)
MCH: 23.8 pg — ABNORMAL LOW (ref 26.0–34.0)
MCHC: 30.5 g/dL (ref 30.0–36.0)
MCV: 78.1 fL — ABNORMAL LOW (ref 80.0–100.0)
Platelets: 187 10*3/uL (ref 150–400)
RBC: 3.19 MIL/uL — ABNORMAL LOW (ref 4.22–5.81)
RDW: 20 % — ABNORMAL HIGH (ref 11.5–15.5)
WBC: 5.7 10*3/uL (ref 4.0–10.5)
nRBC: 1.9 % — ABNORMAL HIGH (ref 0.0–0.2)

## 2021-01-08 LAB — COMPREHENSIVE METABOLIC PANEL
ALT: 7 U/L (ref 0–44)
AST: 8 U/L — ABNORMAL LOW (ref 15–41)
Albumin: 2.6 g/dL — ABNORMAL LOW (ref 3.5–5.0)
Alkaline Phosphatase: 61 U/L (ref 38–126)
Anion gap: 15 (ref 5–15)
BUN: 40 mg/dL — ABNORMAL HIGH (ref 6–20)
CO2: 17 mmol/L — ABNORMAL LOW (ref 22–32)
Calcium: 7.4 mg/dL — ABNORMAL LOW (ref 8.9–10.3)
Chloride: 102 mmol/L (ref 98–111)
Creatinine, Ser: 9.39 mg/dL — ABNORMAL HIGH (ref 0.61–1.24)
GFR, Estimated: 6 mL/min — ABNORMAL LOW (ref >60)
Glucose, Bld: 136 mg/dL — ABNORMAL HIGH (ref 70–99)
Potassium: 2.5 mmol/L — CL (ref 3.5–5.1)
Sodium: 134 mmol/L — ABNORMAL LOW (ref 135–145)
Total Bilirubin: 1 mg/dL (ref 0.3–1.2)
Total Protein: 5.7 g/dL — ABNORMAL LOW (ref 6.5–8.1)

## 2021-01-08 LAB — BASIC METABOLIC PANEL
Anion gap: 11 (ref 5–15)
BUN: 41 mg/dL — ABNORMAL HIGH (ref 6–20)
CO2: 18 mmol/L — ABNORMAL LOW (ref 22–32)
Calcium: 7.2 mg/dL — ABNORMAL LOW (ref 8.9–10.3)
Chloride: 107 mmol/L (ref 98–111)
Creatinine, Ser: 9.21 mg/dL — ABNORMAL HIGH (ref 0.61–1.24)
GFR, Estimated: 6 mL/min — ABNORMAL LOW (ref >60)
Glucose, Bld: 99 mg/dL (ref 70–99)
Potassium: 3.4 mmol/L — ABNORMAL LOW (ref 3.5–5.1)
Sodium: 136 mmol/L (ref 135–145)

## 2021-01-08 LAB — CBG MONITORING, ED: Glucose-Capillary: 156 mg/dL — ABNORMAL HIGH (ref 70–99)

## 2021-01-08 LAB — PROTIME-INR
INR: 1.5 — ABNORMAL HIGH (ref 0.8–1.2)
Prothrombin Time: 17.7 seconds — ABNORMAL HIGH (ref 11.4–15.2)

## 2021-01-08 LAB — TROPONIN I (HIGH SENSITIVITY)
Troponin I (High Sensitivity): 121 ng/L (ref <18)
Troponin I (High Sensitivity): 124 ng/L (ref <18)

## 2021-01-08 LAB — APTT: aPTT: 35 seconds (ref 24–36)

## 2021-01-08 MED ORDER — HYDRALAZINE HCL 10 MG PO TABS: 10.0000 mg | ORAL_TABLET | Freq: Four times a day (QID) | ORAL | Status: DC | PRN

## 2021-01-08 MED ORDER — CLOPIDOGREL BISULFATE 75 MG PO TABS
75.0000 mg | ORAL_TABLET | Freq: Every day | ORAL | Status: DC
  Administered 2021-01-08 – 2021-01-09 (×2): 75 mg via ORAL
  Filled 2021-01-08 (×2): qty 1

## 2021-01-08 MED ORDER — CARVEDILOL 3.125 MG PO TABS
25.0000 mg | ORAL_TABLET | Freq: Two times a day (BID) | ORAL | Status: DC
  Administered 2021-01-08 – 2021-01-09 (×2): 25 mg via ORAL
  Filled 2021-01-08: qty 2
  Filled 2021-01-08: qty 8

## 2021-01-08 MED ORDER — POTASSIUM CHLORIDE 10 MEQ/100ML IV SOLN
10.0000 meq | Freq: Once | INTRAVENOUS | Status: AC
  Administered 2021-01-08: 16:00:00 10 meq via INTRAVENOUS
  Filled 2021-01-08: qty 100

## 2021-01-08 MED ORDER — POTASSIUM CHLORIDE CRYS ER 20 MEQ PO TBCR
40.0000 meq | EXTENDED_RELEASE_TABLET | Freq: Once | ORAL | Status: AC
  Administered 2021-01-08: 15:00:00 40 meq via ORAL
  Filled 2021-01-08: qty 2

## 2021-01-08 MED ORDER — LACTATED RINGERS IV BOLUS
1000.0000 mL | Freq: Once | INTRAVENOUS | Status: AC
  Administered 2021-01-08: 14:00:00 1000 mL via INTRAVENOUS

## 2021-01-08 MED ORDER — ACETAMINOPHEN 325 MG PO TABS: 650.0000 mg | ORAL_TABLET | Freq: Four times a day (QID) | ORAL | Status: DC | PRN

## 2021-01-08 MED ORDER — SODIUM CHLORIDE 0.9% FLUSH
3.0000 mL | Freq: Once | INTRAVENOUS | Status: AC
Start: 2021-01-08 — End: 2021-01-08
  Administered 2021-01-08: 15:00:00 3 mL via INTRAVENOUS

## 2021-01-08 MED ORDER — APIXABAN 5 MG PO TABS
5.0000 mg | ORAL_TABLET | Freq: Two times a day (BID) | ORAL | Status: DC
  Administered 2021-01-08 – 2021-01-09 (×2): 5 mg via ORAL
  Filled 2021-01-08 (×2): qty 1

## 2021-01-08 MED ORDER — FERROUS SULFATE 325 (65 FE) MG PO TABS
325.0000 mg | ORAL_TABLET | Freq: Every day | ORAL | Status: DC
  Administered 2021-01-08 – 2021-01-09 (×2): 325 mg via ORAL
  Filled 2021-01-08 (×2): qty 1

## 2021-01-08 MED ORDER — NITROGLYCERIN 0.4 MG SL SUBL: 0.4000 mg | SUBLINGUAL_TABLET | SUBLINGUAL | Status: DC | PRN

## 2021-01-08 MED ORDER — FLUTICASONE PROPIONATE 50 MCG/ACT NA SUSP: 2.0000 | Freq: Every day | NASAL | Status: DC

## 2021-01-08 MED ORDER — DILTIAZEM HCL-DEXTROSE 125-5 MG/125ML-% IV SOLN (PREMIX): 5.0000 mg/h | INTRAVENOUS | Status: DC

## 2021-01-08 MED ORDER — MAGNESIUM SULFATE 2 GM/50ML IV SOLN
2.0000 g | Freq: Once | INTRAVENOUS | Status: AC
  Administered 2021-01-08: 21:00:00 2 g via INTRAVENOUS
  Filled 2021-01-08: qty 50

## 2021-01-08 MED ORDER — ACETAMINOPHEN 650 MG RE SUPP: 650.0000 mg | Freq: Four times a day (QID) | RECTAL | Status: DC | PRN

## 2021-01-08 MED ORDER — POTASSIUM CHLORIDE 10 MEQ/100ML IV SOLN
10.0000 meq | Freq: Once | INTRAVENOUS | Status: AC
  Administered 2021-01-08: 17:00:00 10 meq via INTRAVENOUS
  Filled 2021-01-08: qty 100

## 2021-01-08 MED ORDER — INSULIN ASPART 100 UNIT/ML IJ SOLN: 0.0000 [IU] | Freq: Three times a day (TID) | INTRAMUSCULAR | Status: DC

## 2021-01-08 MED ORDER — POTASSIUM CHLORIDE CRYS ER 20 MEQ PO TBCR
40.0000 meq | EXTENDED_RELEASE_TABLET | Freq: Once | ORAL | Status: AC
  Administered 2021-01-08: 21:00:00 40 meq via ORAL
  Filled 2021-01-08: qty 2

## 2021-01-08 MED ORDER — ETOMIDATE 2 MG/ML IV SOLN
10.0000 mg | Freq: Once | INTRAVENOUS | Status: AC
  Administered 2021-01-08: 15:00:00 10 mg via INTRAVENOUS

## 2021-01-08 MED ORDER — MAGNESIUM OXIDE -MG SUPPLEMENT 400 (240 MG) MG PO TABS
800.0000 mg | ORAL_TABLET | Freq: Once | ORAL | Status: AC
  Administered 2021-01-08: 15:00:00 800 mg via ORAL
  Filled 2021-01-08: qty 2

## 2021-01-08 MED ORDER — ATORVASTATIN CALCIUM 80 MG PO TABS
80.0000 mg | ORAL_TABLET | Freq: Every day | ORAL | Status: DC
  Administered 2021-01-08: 21:00:00 80 mg via ORAL
  Filled 2021-01-08: qty 1

## 2021-01-08 MED ORDER — DILTIAZEM HCL 25 MG/5ML IV SOLN
10.0000 mg | Freq: Once | INTRAVENOUS | Status: AC
  Administered 2021-01-08: 15:00:00 10 mg via INTRAVENOUS
  Filled 2021-01-08: qty 5

## 2021-01-08 MED ORDER — DILTIAZEM HCL-DEXTROSE 125-5 MG/125ML-% IV SOLN (PREMIX)
5.0000 mg/h | INTRAVENOUS | Status: DC
  Administered 2021-01-08: 16:00:00 5 mg/h via INTRAVENOUS
  Filled 2021-01-08: qty 125

## 2021-01-08 MED ORDER — CALCIUM ACETATE (PHOS BINDER) 667 MG PO CAPS
2001.0000 mg | ORAL_CAPSULE | Freq: Three times a day (TID) | ORAL | Status: DC
  Administered 2021-01-09 (×2): 2001 mg via ORAL
  Filled 2021-01-08 (×2): qty 3

## 2021-01-08 MED ORDER — DILTIAZEM LOAD VIA INFUSION
10.0000 mg | Freq: Once | INTRAVENOUS | Status: AC
  Administered 2021-01-08: 16:00:00 10 mg via INTRAVENOUS
  Filled 2021-01-08: qty 10

## 2021-01-08 MED ORDER — TAMSULOSIN HCL 0.4 MG PO CAPS
0.4000 mg | ORAL_CAPSULE | Freq: Every day | ORAL | Status: DC
  Administered 2021-01-09: 0.4 mg via ORAL
  Filled 2021-01-08: qty 1

## 2021-01-08 MED ORDER — ETOMIDATE 2 MG/ML IV SOLN
INTRAVENOUS | Status: AC | PRN
  Administered 2021-01-08: 3 mg via INTRAVENOUS
  Administered 2021-01-08: 8 mg via INTRAVENOUS

## 2021-01-08 MED ORDER — OXYCODONE HCL 5 MG PO TABS
5.0000 mg | ORAL_TABLET | ORAL | Status: DC | PRN
  Administered 2021-01-08: 18:00:00 5 mg via ORAL
  Filled 2021-01-08: qty 1

## 2021-01-08 NOTE — ED Provider Notes (Signed)
Blood pressure 140/79, pulse (!) 44, temperature 98.6 F (37 C), temperature source Oral, resp. rate 10, height 6\' 3"  (1.905 m), weight 93.2 kg, SpO2 100 %.  Assuming care from Dr. Matilde Sprang.  In short, [name]. is a 50 y.o. male with a chief complaint of Code Stroke .  Refer to the original H&P for additional details.  The current plan of care is to follow up with medicine team for admit. Failed electrical cardioversion x 2 attempted in the ED.  04:27 PM  Made aware by nursing that the patient has spontaneously converted to NSR while in the ED. Will continue K replacement and med admit. EKG without acute ischemic change. QTc is prolonged at 554, listed in Epic problem list.    EKG Interpretation  Date/Time:  Thursday January 08 2021 16:23:59 EST Ventricular Rate:  63 PR Interval:  173 QRS Duration: 99 QT Interval:  541 QTC Calculation: 554 R Axis:   26 Text Interpretation: Sinus rhythm Anteroseptal infarct, old Prolonged QT interval Confirmed by Nanda Quinton [phone]) on 01/08/2021 4:28:51 PM       Discussed patient's case with [name] to request admission. Patient and family (if present) updated with plan. Care transferred to [facility] service.  I reviewed all nursing notes, vitals, pertinent old records, EKGs, labs, imaging (as available).  Placed order for Cardiology consult at [facility] request.     Erleen Egner, W[facility], MD 01/08/21 [phone]

## 2021-01-08 NOTE — ED Provider Notes (Addendum)
[facility] EMERGENCY DEPARTMENT Provider Note   CSN: [id number] Arrival date & time: 01/08/21  1341  An emergency department physician performed an initial assessment on this suspected stroke patient at 1344.  History Chief Complaint  Patient presents with   Code Stroke    [name]. is a 50 y.o. male with PMH ESRD on dialysis Tuesday Thursday Saturday, CHF, HTN who presents emergency department as a stroke alert due to concern for prolonged altered mental status.  Per EMS, patient was found in his car with his eyes rolled back in his head but has since returned to normal mental status baseline.  He was found to be in A. fib with RVR at that time and brought to the emergency department due to concern for possible basilar stroke.  On chart review, it appears that the patient frequently goes into atrial fibrillation with RVR after dialysis and the patient did receive dialysis today.  He states that he felt dizzy after dialysis and then passed out in his car.  Currently denies chest pain, shortness of breath, abdominal pain, nausea, vomiting or other systemic symptoms.  HPI     Past Medical History:  Diagnosis Date   Anxiety    Cellulitis    CHF (congestive heart failure) (HCC)    Chronic diarrhea    Chronic kidney disease    Chronic pain of both ankles    Diabetes mellitus without complication (HCC)    Diabetes mellitus, type II ([place])    Diabetic neuropathy (HCC)    Frequent falls    Gait instability    GERD (gastroesophageal reflux disease)    Heart palpitations    History of kidney stones    HLD (hyperlipidemia)    Hypertension    Insomnia    Nausea and vomiting in adult    recurrent    Patient Active Problem List   Diagnosis Date Noted   Atrial fibrillation with RVR ([place]) 12/08/2020   Finger amputation, 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 96/22/2979   Atrial fibrillation with rapid ventricular response ([place])    Gangrene of finger of right hand ([place])  11/25/2020   Postural dizziness with presyncope 11/24/2020   Elevated troponin 11/24/2020   Left leg cellulitis 11/24/2020   Frequent falls 11/24/2020   Finger infection, fourth left 11/24/2020   AF (paroxysmal atrial fibrillation) ([place]) 11/24/2020   Hypotension 11/24/2020   NSTEMI (non-ST elevated myocardial infarction) ([place]) 11/24/2020   Abnormal gait 08/19/2020   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[place]) 08/19/2020   Anxiety state 08/19/2020   Callosity 08/19/2020   ED (erectile dysfunction) of organic origin 08/19/2020   Heart failure ([place]) 08/19/2020   Long term (current) use of insulin ([place]) 08/19/2020   Myalgia 08/19/2020   Obstructive uropathy 08/19/2020   Reactive depression 08/19/2020   History of ST elevation myocardial infarction (STEMI) 02/2020 s/p DES to LAD 08/19/2020   ESRD (end stage renal disease) ([place]) 08/19/2020   Blind painful left eye 08/15/2020   Combined forms of age-related cataract of right eye 04/17/2020   Supraventricular tachycardia ([place]) 02/26/2020   Acute anterior wall MI ([place])    Benign prostatic hyperplasia with lower urinary tract symptoms 11/07/2019   Fluid overload, unspecified 08/15/2019   Hypertension secondary to other renal disorders 07/20/2019   NVG (neovascular glaucoma), left, 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   Symptomatic anemia 06/05/2019   Aftercare including intermittent dialysis ([place]) 05/15/2019   Moderate protein-calorie malnutrition ([place]) 05/14/2019   Anaphylactic reaction due to adverse effect of correct drug  or medicament properly administered, initial encounter 05/08/2019   Chronic anemia 05/07/2019   Chest pain, unspecified 05/07/2019   Coagulation defect, unspecified ([place]) 05/07/2019   Disorder of phosphorus metabolism, unspecified 05/07/2019   Encounter for fitting and adjustment of extracorporeal dialysis catheter ([place]) 05/07/2019   Gastroparesis 05/07/2019   Iron deficiency anemia, unspecified 05/07/2019   Major depressive  disorder, recurrent, unspecified ([place]) 05/07/2019   Pain, unspecified 05/07/2019   Pruritus, unspecified 05/07/2019   Dizziness 58/52/7782   Metabolic acidosis 42/35/3614   Syncope 04/06/2019   Urinary retention 04/06/2019   Hypertensive chronic kidney disease with stage 1 through stage 4 chronic kidney disease, or unspecified chronic kidney disease 11/28/2018   Edema 11/28/2018   Hypokalemia 11/28/2018   Hypo-osmolality and hyponatremia 11/28/2018   Proteinuria 11/28/2018   Secondary hyperparathyroidism of renal origin ([place]) 11/28/2018   Lymphedema 01/09/2018   Chronic diastolic heart failure ([place]) 12/13/2017   Vomiting 11/28/2017   Acute on chronic heart failure ([place]) 11/23/2017   Type II diabetes mellitus ([place]) 11/23/2017   Diabetic retinopathy ([place]) 07/05/2017   Uncontrolled type 2 diabetes mellitus with hyperglycemia ([place]) 07/08/2016   Uncontrolled type 2 diabetes mellitus with hyperglycemia, with long-term current use of insulin ([place]) 04/20/2016   Osteomyelitis of toe of left foot ([place]) 03/15/2016   Acute renal failure with tubular necrosis ([place]) 06/23/2015   Diarrhea 06/23/2015   Mixed hyperlipidemia    Essential hypertension, benign    Heart palpitations 05/06/2015   Sepsis ([place]) 03/07/2015    Past Surgical History:  Procedure Laterality Date   AMPUTATION Left 03/16/2016   Procedure: AMPUTATION DIGIT LEFT HALLUX;  Surgeon: Trula Slade, DPM;  Location: [facility];  Service: Podiatry;  Laterality: Left;  can start around 5    ARTHROSCOPIC REPAIR ACL Left    AV FISTULA PLACEMENT Right 05/31/2019   Procedure: Brachiocephalic AV fistula creation;  Surgeon: Algernon Huxley, MD;  Location: [name] OR[facility];  Service: Vascular;  Laterality: Right;   COLONOSCOPY WITH PROPOFOL N/A 10/28/2015   Procedure: COLONOSCOPY WITH PROPOFOL;  Surgeon: Lollie Sails, MD;  Location[facility] END[facility];  Service: Endoscopy;  Laterality: N/A;   COLONOSCOPY WITH PROPOFOL N/A 10/29/2015   Procedure: COLONOSCOPY  WITH PROPOFOL;  Surgeon: Lollie Sails, MD;  Location: [facility];  Service: Endoscopy;  Laterality: N/A;   CORONARY/GRAFT ACUTE MI REVASCULARIZATION N/A 02/26/2020   Procedure: Coronary/Graft Acute MI Revascularization;  Surgeon: Jettie Booze, MD;  Location: [facility] CV LAB;  Service: Cardiovascular;  Laterality: N/A;   DIALYSIS/PERMA CATHETER INSERTION Right 04/26/2019   Perm Cath    DIALYSIS/PERMA CATHETER INSERTION N/A 04/26/2019   Procedure: DIALYSIS/PERMA CATHETER INSERTION;  Surgeon: Algernon Huxley, MD;  Location: [facility] LAB;  Service: Cardiovascular;  Laterality: N/A;   DIALYSIS/PERMA CATHETER REMOVAL N/A 08/15/2019   Procedure: DIALYSIS/PERMA CATHETER REMOVAL;  Surgeon: Katha Cabal, MD;  Location: [facility] CV LAB;  Service: Cardiovascular;  Laterality: N/A;   ESOPHAGOGASTRODUODENOSCOPY (EGD) WITH PROPOFOL N/A 12/27/2017   Procedure: ESOPHAGOGASTRODUODENOSCOPY (EGD) WITH PROPOFOL;  Surgeon: Toledo, Benay Pike, MD;  Location: [name] ENDOSCOPY;  Service: Gastroenterology;  Laterality: N/A;   INTRAVASCULAR ULTRASOUND/IVUS N/A 02/26/2020   Procedure: Intravascular Ultrasound/IVUS;  Surgeon: Jettie Booze, MD;  Location: [facility] CV LAB;  Service: Cardiovascular;  Laterality: N/A;   LEFT HEART CATH AND CORONARY ANGIOGRAPHY N/A 02/26/2020   Procedure: LEFT HEART CATH AND CORONARY ANGIOGRAPHY;  Surgeon: Jettie Booze, MD;  Location: [facility] LAB;  Service: Cardiovascular;  Laterality: N/A;  PROSTATE SURGERY  2016   TONSILECTOMY/ADENOIDECTOMY WITH MYRINGOTOMY     TONSILLECTOMY     UPPER EXTREMITY ANGIOGRAPHY Right 11/26/2020   Procedure: Upper Extremity Angiography;  Surgeon: Algernon Huxley, MD;  Location: [facility] CV LAB;  Service: Cardiovascular;  Laterality: Right;       Family History  Problem Relation Age of Onset   CAD Father    Stroke Father    Diabetes Mellitus II Mother    Kidney failure Mother    Schizophrenia Mother      Social History   Tobacco Use   Smoking status: Never   Smokeless tobacco: Never  Vaping Use   Vaping Use: Never used  Substance Use Topics   Alcohol use: No   Drug use: No    Home Medications Prior to Admission medications   Medication Sig Start Date End Date Taking? Authorizing Provider  amiodarone (PACERONE) 200 MG tablet 1 twice daily for 2 weeks, then 1 daily 12/09/20   Johnson, Clanford L, MD  apixaban (ELIQUIS) 5 MG TABS tablet Take 1 tablet (5 mg total) by mouth 2 (two) times daily. 11/27/20   Sharen Hones, MD  atorvastatin (LIPITOR) 80 MG tablet TAKE 1 TABLET (80 MG TOTAL) BY MOUTH AT BEDTIME. 02/28/20 02/27/21  Reino Bellis B, NP  azelastine (ASTELIN) 0.1 % nasal spray Place 2 sprays into both nostrils 2 (two) times daily. 09/08/20   [provider]  calcium acetate (PHOSLO) 667 MG capsule Take 3 capsules (2,001 mg total) by mouth 3 (three) times daily with meals. 11/27/20   Sharen Hones, MD  carvedilol (COREG) 25 MG tablet Take 1 tablet (25 mg total) by mouth 2 (two) times daily with a meal. 12/09/20   Johnson, Clanford L, MD  clopidogrel (PLAVIX) 75 MG tablet Take 1 tablet (75 mg total) by mouth daily. 12/09/20   Johnson, Clanford L, MD  FEROSUL 325 (65 Fe) MG tablet Take 325 mg by mouth daily. 09/08/20   [provider]  fluticasone (FLONASE) 50 MCG/ACT nasal spray Place 2 sprays into both nostrils daily. 09/08/20   [provider]  lidocaine-prilocaine (EMLA) cream Apply 1 application topically Every Tuesday,Thursday,and Saturday with dialysis.  08/15/19   [provider]  nitroGLYCERIN (NITROSTAT) 0.4 MG SL tablet Place 1 tablet (0.4 mg total) under the tongue every 5 (five) minutes as needed for chest pain. 12/09/20 12/09/21  Johnson, Clanford L, MD  oxyCODONE (OXY IR/ROXICODONE) 5 MG immediate release tablet Take 1 tablet by mouth every 4 (four) hours as needed. 12/04/20   [provider]  prednisoLONE acetate (PRED FORTE) 1 %  ophthalmic suspension Place 1 drop into the left eye every 4 (four) hours.    [provider]  tamsulosin (FLOMAX) 0.4 MG CAPS capsule Take 0.4 mg by mouth daily after breakfast.     [provider]    Allergies    Levofloxacin, Promethazine, Other, and Malt  Review of Systems   Review of Systems  Constitutional:  Negative for chills and fever.  HENT:  Negative for ear pain and sore throat.   Eyes:  Negative for pain and visual disturbance.  Respiratory:  Negative for cough and shortness of breath.   Cardiovascular:  Positive for palpitations. Negative for chest pain.  Gastrointestinal:  Negative for abdominal pain and vomiting.  Genitourinary:  Negative for dysuria and hematuria.  Musculoskeletal:  Negative for arthralgias and back pain.  Skin:  Negative for color change and rash.  Neurological:  Positive for syncope and  light-headedness. Negative for seizures.  All other systems reviewed and are negative.  Physical Exam Updated Vital Signs BP 131/88   Pulse (!) 107   Temp 98.6 F (37 C) (Oral)   Resp 16   Ht 6\' 3"  (1.905 m)   Wt 93.2 kg   SpO2 100%   BMI 25.68 kg/m   Physical Exam Vitals and nursing note reviewed.  Constitutional:      General: He is not in acute distress.    Appearance: He is well-developed.  HENT:     Head: Normocephalic and atraumatic.  Eyes:     Conjunctiva/sclera: Conjunctivae normal.  Cardiovascular:     Rate and Rhythm: Tachycardia present. Rhythm irregular.     Heart sounds: No murmur heard. Pulmonary:     Effort: Pulmonary effort is normal. No respiratory distress.     Breath sounds: Normal breath sounds.  Abdominal:     Palpations: Abdomen is soft.     Tenderness: There is no abdominal tenderness.  Musculoskeletal:        General: No swelling.     Cervical back: Neck supple.  Skin:    General: Skin is warm and dry.     Capillary Refill: Capillary refill takes less than 2 seconds.  Neurological:     Mental  Status: He is alert.  Psychiatric:        Mood and Affect: Mood normal.    ED Results / Procedures / Treatments   Labs (all labs ordered are listed, but only abnormal results are displayed) Labs Reviewed  PROTIME-INR - Abnormal; Notable for the following components:      Result Value   Prothrombin Time 17.7 (*)    INR 1.5 (*)    All other components within normal limits  CBC - Abnormal; Notable for the following components:   RBC 3.19 (*)    Hemoglobin 7.6 (*)    HCT 24.9 (*)    MCV 78.1 (*)    MCH 23.8 (*)    RDW 20.0 (*)    nRBC 1.9 (*)    All other components within normal limits  DIFFERENTIAL - Abnormal; Notable for the following components:   Lymphs Abs 0.6 (*)    Monocytes Absolute 1.1 (*)    Abs Immature Granulocytes 0.08 (*)    All other components within normal limits  COMPREHENSIVE METABOLIC PANEL - Abnormal; Notable for the following components:   Sodium 134 (*)    Potassium 2.5 (*)    CO2 17 (*)    Glucose, Bld 136 (*)    BUN 40 (*)    Creatinine, Ser 9.39 (*)    Calcium 7.4 (*)    Total Protein 5.7 (*)    Albumin 2.6 (*)    AST 8 (*)    GFR, Estimated 6 (*)    All other components within normal limits  I-STAT CHEM 8, ED - Abnormal; Notable for the following components:   Potassium 2.5 (*)    BUN 34 (*)    Creatinine, Ser 10.10 (*)    Glucose, Bld 130 (*)    Calcium, Ion 0.94 (*)    TCO2 18 (*)    Hemoglobin 8.8 (*)    HCT 26.0 (*)    All other components within normal limits  CBG MONITORING, ED - Abnormal; Notable for the following components:   Glucose-Capillary 156 (*)    All other components within normal limits  TROPONIN I (HIGH SENSITIVITY) - Abnormal; Notable for the following components:  Troponin I (High Sensitivity) 121 (*)    All other components within normal limits  APTT  TROPONIN I (HIGH SENSITIVITY)    EKG None  Radiology CT HEAD CODE STROKE WO CONTRAST  Result Date: 01/08/2021 CLINICAL DATA:  Code stroke.  Neuro deficit,  acute, stroke suspected EXAM: CT HEAD WITHOUT CONTRAST TECHNIQUE: Contiguous axial images were obtained from the base of the skull through the vertex without intravenous contrast. COMPARISON:  December 08, 2020. FINDINGS: Brain: No evidence of acute infarction, hemorrhage, hydrocephalus, extra-axial collection or mass lesion/mass effect. Vascular: No hyperdense vessel identified. Intracranial and extracranial calcific atherosclerosis. Skull: No acute fracture. Chronic thickening of the skin along the occiput/upper neck. Sinuses/Orbits: Moderate paranasal sinus mucosal thickening Other: No acute abnormality. ASPECTS [facility] Stroke Program Early CT Score) total score (0-10 with 10 being normal): 10. IMPRESSION: No evidence of acute abnormality on this mildly motion limited exam. ASPECTS is 10. Code stroke imaging results were communicated on 01/08/2021 at 1:57 pm to provider Dr. Leonel Ramsay via secure text paging. Electronically Signed   By: Margaretha Sheffield M.D.   On: 01/08/2021 13:59    Procedures .Sedation  Date/Time: 01/08/2021 3:36 PM Performed by: Teressa Lower, MD Authorized by: Teressa Lower, MD   Consent:    Consent obtained:  Written   Consent given by:  Patient   Risks discussed:  Allergic reaction, prolonged hypoxia resulting in organ damage, prolonged sedation necessitating reversal, dysrhythmia and inadequate sedation Universal protocol:    Immediately prior to procedure, a time out was called: yes   Indications:    Procedure performed:  Cardioversion   Procedure necessitating sedation performed by:  Physician performing sedation Pre-sedation assessment:    Time since last food or drink:  0800   ASA classification: class 3 - patient with severe systemic disease     Mallampati score:  I - soft palate, uvula, fauces, pillars visible   Pre-sedation assessments completed and reviewed: airway patency, cardiovascular function, mental status, nausea/vomiting, respiratory function and  temperature   Immediate pre-procedure details:    Reviewed: vital signs and relevant labs/tests     Verified: bag valve mask available, emergency equipment available, intubation equipment available and oxygen available   Procedure details (see MAR for exact dosages):    Preoxygenation:  Nasal cannula   Sedation:  Etomidate   Intended level of sedation: deep   Analgesia:  None   Intra-procedure monitoring:  Blood pressure monitoring, continuous capnometry, frequent LOC assessments and cardiac monitor   Intra-procedure events: none     Total Provider sedation time (minutes):  20 Post-procedure details:    Post-sedation assessments completed and reviewed: airway patency, cardiovascular function, mental status, nausea/vomiting, respiratory function and temperature     Procedure completion:  Tolerated well, no immediate complications .Cardioversion  Date/Time: 01/08/2021 3:37 PM Performed by: Teressa Lower, MD Authorized by: Teressa Lower, MD   Consent:    Consent obtained:  Written   Consent given by:  Patient   Risks discussed:  Cutaneous burn, death, induced arrhythmia and pain   Alternatives discussed:  No treatment and rate-control medication Pre-procedure details:    Cardioversion basis:  Elective   Rhythm:  Atrial fibrillation   Electrode placement:  Anterior-posterior Patient sedated: Yes. Refer to sedation procedure documentation for details of sedation.  Attempt one:    Cardioversion mode:  Synchronous   Waveform:  Monophasic   Shock (Joules):  200   Shock outcome:  No change in rhythm Attempt two:    Cardioversion mode:  Synchronous  Shock (Joules):  200   Shock outcome:  No change in rhythm Post-procedure details:    Patient status:  Awake   Patient tolerance of procedure:  Tolerated well, no immediate complications   Medications Ordered in ED Medications  etomidate (AMIDATE) injection (3 mg Intravenous Given 01/08/21 1519)  diltiazem (CARDIZEM) 1 mg/mL  load via infusion 10 mg (has no administration in time range)    And  diltiazem (CARDIZEM) 125 mg in dextrose 5% 125 mL (1 mg/mL) infusion (has no administration in time range)  potassium chloride 10 mEq in 100 mL IVPB (has no administration in time range)  diltiazem (CARDIZEM) 125 mg in dextrose 5% 125 mL (1 mg/mL) infusion (has no administration in time range)  sodium chloride flush (NS) 0.9 % injection 3 mL (3 mLs Intravenous Given 01/08/21 1431)  diltiazem (CARDIZEM) injection 10 mg (10 mg Intravenous Given 01/08/21 1431)  lactated ringers bolus 1,000 mL (0 mLs Intravenous Stopped 01/08/21 1446)  potassium chloride SA (KLOR-CON) CR tablet 40 mEq (40 mEq Oral Given 01/08/21 1430)  magnesium oxide (MAG-OX) tablet 800 mg (800 mg Oral Given 01/08/21 1430)  etomidate (AMIDATE) injection 10 mg (10 mg Intravenous Given 01/08/21 1517)    ED Course  I have reviewed the triage vital signs and the nursing notes.  Pertinent labs & imaging results that were available during my care of the patient were reviewed by me and considered in my medical decision making (see chart for details).    MDM Rules/Calculators/A&P                           Patient seen emergency department for evaluation of altered mental status and syncope after dialysis.  CT head unremarkable from a stroke alert standpoint.  Neurology does not believe the patient requires a CT angiogram at this time and I agree.  Laboratory evaluation shows a hemoglobin of 7.6 which is baseline for the patient, potassium 2.5, BUN 40, creatinine 9.39, initial troponin 121 likely elevated in setting of demand ischemia.  ECG with A. fib with RVR.  Patient given a liter of fluids and 10 of Cardizem which did not convert his rhythm.  I spoke with cardiology and as the patient has been compliant with his Eliquis, he is within the window to receive bedside cardioversion.  Bedside cardioversion performed with 2 shocks at 200 J and unfortunately did not  convert back to normal sinus rhythm.  He was then placed on a Cardizem drip and admitted to medicine. Final Clinical Impression(s) / ED Diagnoses Final diagnoses:  None    Rx / DC Orders ED Discharge Orders     None        [name], Debe Coder, MD 01/08/21 [place], Ruberta Holck, MD 01/08/21 1538

## 2021-01-08 NOTE — Progress Notes (Signed)
Responded to page to support to patient. Pt.gone to CT Scan will follow as needed.  Jaclynn Major, [place], [facility], Pager [phone]

## 2021-01-08 NOTE — ED Notes (Signed)
Family updated as to patient's status per pts request

## 2021-01-08 NOTE — Consult Note (Signed)
Neurology Consultation Reason for Consult: Unresponsiveness Referring Physician: Burgess Estelle  CC: Unresponsiveness  History is obtained from: EMS, patient  HPI: [name]. is a 50 y.o. male with a history of diabetes, CHF, atrial fibrillation on anticoagulation with Eliquis who presents with unresponsiveness.  He has had several episodes of syncope over the past few weeks, and states that this always occurs after dialysis.  Today, he had dialysis and was driving home.  States he lives about 30 miles from dialysis.  He started feeling lightheaded on the way home.  He called 911 after pulling into his driveway.  He then states there is a period of time that he does not remember well.  EMS arrived and found him to be hypotensive in the 21H systolic, glucose of 78, unresponsive.  He was given fluids and D10 and improved en route.  By the time of arrival, he is awake, alert, interactive and appropriate.  He never had any focal neurological deficits.  He denies any focal numbness or weakness.   LKW: This morning, unclear exact time tpa given?: no, not a stroke   ROS: A 14 point ROS was performed and is negative except as noted in the HPI.  Past Medical History:  Diagnosis Date   Anxiety    Cellulitis    CHF (congestive heart failure) (HCC)    Chronic diarrhea    Chronic kidney disease    Chronic pain of both ankles    Diabetes mellitus without complication (HCC)    Diabetes mellitus, type II (HCC)    Diabetic neuropathy (HCC)    Frequent falls    Gait instability    GERD (gastroesophageal reflux disease)    Heart palpitations    History of kidney stones    HLD (hyperlipidemia)    Hypertension    Insomnia    Nausea and vomiting in adult    recurrent     Family History  Problem Relation Age of Onset   CAD Father    Stroke Father    Diabetes Mellitus II Mother    Kidney failure Mother    Schizophrenia Mother      Social History:  reports that he has never smoked. He  has never used smokeless tobacco. He reports that he does not drink alcohol and does not use drugs.   Exam: Current vital signs: BP 95/74 (BP Location: Left Arm)   Pulse 94   Temp 97.8 F (36.6 C) (Oral)   Resp 20   Ht 6\' 3"  (1.905 m)   Wt 93.2 kg   SpO2 99%   BMI 25.68 kg/m  Vital signs in last 24 hours: Temp:  [97.8 F (36.6 C)] 97.8 F (36.6 C) (11/17 1359) Pulse Rate:  [94] 94 (11/17 1359) Resp:  [20] 20 (11/17 1359) BP: (95)/(74) 95/74 (11/17 1359) SpO2:  [99 %] 99 % (11/17 1359) Weight:  [93.2 kg] 93.2 kg (11/17 1300)   Physical Exam  Constitutional: Appears chronically ill Psych: Affect appropriate to situation Eyes: No scleral injection HENT: No OP obstruction MSK: no joint deformities.  Cardiovascular: Normal rate and regular rhythm.  Respiratory: Effort normal, non-labored breathing GI: Soft.  No distension. There is no tenderness.  Skin: WDI  Neuro: Mental Status: Patient is awake, alert, oriented to person, place, month, year, and situation. Patient is able to give a clear and coherent history. No signs of aphasia or neglect Cranial Nerves: II: Visual Fields are full. Pupils are equal, round, and reactive to light.  III,IV, VI: EOMI without ptosis or diploplia.  V: Facial sensation is symmetric to temperature VII: Facial movement is symmetric.  VIII: hearing is intact to voice X: Uvula elevates symmetrically XI: Shoulder shrug is symmetric. XII: tongue is midline without atrophy or fasciculations.  Motor: He is unable to keep his legs lifted due to pain at his hip, but full strength with knee extension/flexion, ankle dorsi/plantar flexion bilaterally.  No drift in upper extremities. Sensory: Sensation is symmetric to light touch and temperature in the arms and legs. Cerebellar: No clear ataxia on finger-nose-finger  I have reviewed labs in epic and the results pertinent to this consultation are: Potassium 2.5 Ionized calcium 0.94  I have  reviewed the images obtained: CT head-negative  Impression: 50 year old male with recurrent episodes of syncope always occurring immediately postdialysis as well as multiple recent episodes of atrial fibrillation with RVR.  My suspicion is that this is more related to his multiple electrolyte abnormalities and hemodynamic state rather than a neurological presentation.  If once he is medically stabilized he continues to have focal deficits (which he does not have currently) then an MRI could be obtained, but I think that this is low yield at this time.  Recommendations: 1) MRI brain could be considered if he continues to have focal deficits. 2) continue Eliquis for stroke prevention 3) consider discussing with nephrology if dialysis parameters could be changed to more gentle settings 4) further recommendations per emergency department physician, neurology will be available as needed.   Roland Rack, MD [facility] [phone]  If 7pm- 7am, please page neurology on call as listed in [place].

## 2021-01-08 NOTE — Code Documentation (Signed)
Stroke Response Nurse Documentation Code Documentation  [name]. is a 50 y.o. male arriving to [facility] ED via  EMS  on 01/08/21 with past medical hx of CHF, CKD, DM, falls, HLD, HTN. On Eliquis (apixaban) daily. Code stroke was activated by EMS.   Patient from home where he was LKW this am (unknown) and now complaining of unresponsiveness. Pt had driven from dialysis and was in his car for approx 2 hours unresponsive in his driveway. He came to and called 911. They found him unrepsonsive with BP in the 90's and CBG in the low 70's. Fluid bolus and dextrose given.   Stroke team at the bedside on patient arrival. Labs drawn and patient cleared for CT by Dr. Matilde Sprang. Patient to CT with team. NIHSS 6, see documentation for details and code stroke times. Patient with bilateral leg weakness on exam which is baseline per pt. The following imaging was completed: CT.  Patient is not a candidate for IV Thrombolytic due to stroke not suspected, no stroke symptoms. Patient is not a candidate for IR due to no stroke suspected.   Care/Plan: Q2 then Q4 vitals/neuro checks.   Bedside handoff with ED RN Marta Antu.    Kayceon Oki, Rande Brunt  Stroke Response RN

## 2021-01-08 NOTE — Sedation Documentation (Signed)
Shock deliver by Kommor MD [place]

## 2021-01-08 NOTE — Sedation Documentation (Signed)
Shock deliver by Kommor MD [place]

## 2021-01-08 NOTE — H&P (Addendum)
History and Physical  [name]. QMG:[id number] DOB: 1970/12/15 DOA: 01/08/2021  PCP: [name], MD Patient coming from: Home   I have personally briefly reviewed patient's old medical records in [place]   Chief Complaint: Unresponsiveness.   HPI: [name] Jaymin Waln. is a 50 y.o. male with past medical history significant for ESRD on hemodialysis, diabetes type 2 insulin-dependent, A. fib on Eliquis, who presents with unresponsiveness.  Patient has history of recurrent syncope, last admission for same last month. Today he was driving home after hemodialysis.  He was feeling lightheaded on the way home.  He called 911 after pulling into his driveway.  He does not remember anything  after that.  On EMS arrival patient was found to be hypotensive with systolic blood pressure in the 90s, CBG 78, unresponsive.  He was given fluids D10 and improved in route.  Time to arrival to the ED he was alert and interactive.  Patient was found to be in A. fib with RVR on arrival to the ED. patient received 10 mg of IV Cardizem in the ED which did not convert his rhythm.  Subsequently ED physician perform bedside cardioversion with 2 shocks at 200 J, unsuccessful cardioversion.  Subsequently patient was a started on Cardizem drip. Currently patient in sinus rhythm.   He denies chest pain or dyspnea. He has history of chronic diarrhea, from gastroparesis, he has bowel movement every time after he eats. He denies headaches.   He report chronic hip pain.   Review of Systems: All systems reviewed and apart from history of presenting illness, are negative.  Past Medical History:  Diagnosis Date   Anxiety    Cellulitis    CHF (congestive heart failure) (HCC)    Chronic diarrhea    Chronic kidney disease    Chronic pain of both ankles    Diabetes mellitus without complication (HCC)    Diabetes mellitus, type II (HCC)    Diabetic neuropathy (HCC)    Frequent falls    Gait  instability    GERD (gastroesophageal reflux disease)    Heart palpitations    History of kidney stones    HLD (hyperlipidemia)    Hypertension    Insomnia    Nausea and vomiting in adult    recurrent   Past Surgical History:  Procedure Laterality Date   AMPUTATION Left 03/16/2016   Procedure: AMPUTATION DIGIT LEFT HALLUX;  Surgeon: Trula Slade, DPM;  Location: [facility];  Service: Podiatry;  Laterality: Left;  can start around 5    ARTHROSCOPIC REPAIR ACL Left    AV FISTULA PLACEMENT Right 05/31/2019   Procedure: Brachiocephalic AV fistula creation;  Surgeon: Algernon Huxley, MD;  Location: [name] ORS;  Service: Vascular;  Laterality: Right;   COLONOSCOPY WITH PROPOFOL N/A 10/28/2015   Procedure: COLONOSCOPY WITH PROPOFOL;  Surgeon: Lollie Sails, MD;  Location: [facility] ENDOSCOPY;  Service: Endoscopy;  Laterality: N/A;   COLONOSCOPY WITH PROPOFOL N/A 10/29/2015   Procedure: COLONOSCOPY WITH PROPOFOL;  Surgeon: Lollie Sails, MD;  Location: [facility] ENDOSCOPY;  Service: Endoscopy;  Laterality: N/A;   CORONARY/GRAFT ACUTE MI REVASCULARIZATION N/A 02/26/2020   Procedure: Coronary/Graft Acute MI Revascularization;  Surgeon: Jettie Booze, MD;  Location: [place] CV LAB;  Service: Cardiovascular;  Laterality: N/A;   DIALYSIS/PERMA CATHETER INSERTION Right 04/26/2019   Perm Cath    DIALYSIS/PERMA CATHETER INSERTION N/A 04/26/2019   Procedure: DIALYSIS/PERMA CATHETER INSERTION;  Surgeon: Algernon Huxley, MD;  Location: [facility]  INVASIVE CV LAB;  Service: Cardiovascular;  Laterality: N/A;   DIALYSIS/PERMA CATHETER REMOVAL N/A 08/15/2019   Procedure: DIALYSIS/PERMA CATHETER REMOVAL;  Surgeon: Katha Cabal, MD;  Location: [place] CV LAB;  Service: Cardiovascular;  Laterality: N/A;   ESOPHAGOGASTRODUODENOSCOPY (EGD) WITH PROPOFOL N/A 12/27/2017   Procedure: ESOPHAGOGASTRODUODENOSCOPY (EGD) WITH PROPOFOL;  Surgeon: [place], Benay Pike, MD;  Location: [name] ENDOSCOPY;  Service: Gastroenterology;   Laterality: N/A;   INTRAVASCULAR ULTRASOUND/IVUS N/A 02/26/2020   Procedure: Intravascular Ultrasound/IVUS;  Surgeon: Jettie Booze, MD;  Location: [place] CV LAB;  Service: Cardiovascular;  Laterality: N/A;   LEFT HEART CATH AND CORONARY ANGIOGRAPHY N/A 02/26/2020   Procedure: LEFT HEART CATH AND CORONARY ANGIOGRAPHY;  Surgeon: Jettie Booze, MD;  Location: [place] CV LAB;  Service: Cardiovascular;  Laterality: N/A;   PROSTATE SURGERY  2016   TONSILECTOMY/ADENOIDECTOMY WITH MYRINGOTOMY     TONSILLECTOMY     UPPER EXTREMITY ANGIOGRAPHY Right 11/26/2020   Procedure: Upper Extremity Angiography;  Surgeon: Algernon Huxley, MD;  Location: [place] CV LAB;  Service: Cardiovascular;  Laterality: Right;   Social History:  reports that he has never smoked. He has never used smokeless tobacco. He reports that he does not drink alcohol and does not use drugs.   Allergies  Allergen Reactions   Levofloxacin Swelling    Of face Other reaction(s): swelling   Promethazine Diarrhea and Other (See Comments)    Other reaction(s): Other (See Comments), Other (See Comments) Other reaction(s): Muscle cramps Muscle cramps Cramping Other reaction(s): Muscle cramps Muscle cramps   Other     Other reaction(s): Unknown   Malt Other (See Comments)    Other reaction(s): cramping    Family History  Problem Relation Age of Onset   CAD Father    Stroke Father    Diabetes Mellitus II Mother    Kidney failure Mother    Schizophrenia Mother     Prior to Admission medications   Medication Sig Start Date End Date Taking? Authorizing Provider  amiodarone (PACERONE) 200 MG tablet 1 twice daily for 2 weeks, then 1 daily 12/09/20   Johnson, Clanford L, MD  apixaban (ELIQUIS) 5 MG TABS tablet Take 1 tablet (5 mg total) by mouth 2 (two) times daily. 11/27/20   Sharen Hones, MD  atorvastatin (LIPITOR) 80 MG tablet TAKE 1 TABLET (80 MG TOTAL) BY MOUTH AT BEDTIME. 02/28/20 02/27/21  Reino Bellis B,  NP  azelastine (ASTELIN) 0.1 % nasal spray Place 2 sprays into both nostrils 2 (two) times daily. 09/08/20   [provider]  calcium acetate (PHOSLO) 667 MG capsule Take 3 capsules (2,001 mg total) by mouth 3 (three) times daily with meals. 11/27/20   Sharen Hones, MD  carvedilol (COREG) 25 MG tablet Take 1 tablet (25 mg total) by mouth 2 (two) times daily with a meal. 12/09/20   Johnson, Clanford L, MD  clopidogrel (PLAVIX) 75 MG tablet Take 1 tablet (75 mg total) by mouth daily. 12/09/20   Johnson, Clanford L, MD  FEROSUL 325 (65 Fe) MG tablet Take 325 mg by mouth daily. 09/08/20   [provider]  fluticasone (FLONASE) 50 MCG/ACT nasal spray Place 2 sprays into both nostrils daily. 09/08/20   [provider]  lidocaine-prilocaine (EMLA) cream Apply 1 application topically Every Tuesday,Thursday,and Saturday with dialysis.  08/15/19   [provider]  nitroGLYCERIN (NITROSTAT) 0.4 MG SL tablet Place 1 tablet (0.4 mg total) under the tongue every 5 (five) minutes as needed for chest  pain. 12/09/20 12/09/21  Johnson, Clanford L, MD  oxyCODONE (OXY IR/ROXICODONE) 5 MG immediate release tablet Take 1 tablet by mouth every 4 (four) hours as needed. 12/04/20   [provider]  prednisoLONE acetate (PRED FORTE) 1 % ophthalmic suspension Place 1 drop into the left eye every 4 (four) hours.    [provider]  tamsulosin (FLOMAX) 0.4 MG CAPS capsule Take 0.4 mg by mouth daily after breakfast.     [provider]   Physical Exam: Vitals:   01/08/21 1523 01/08/21 1524 01/08/21 1525 01/08/21 1602  BP:  138/90 131/88 140/79  Pulse: (!) 107 (!) 107 (!) 107 (!) 44  Resp: 13 16 16 10   Temp:      TempSrc:      SpO2: 99% 100% 100% 100%  Weight:      Height:        General exam: Moderately built and nourished patient, lying comfortably supine on the gurney in no obvious distress. Head, eyes and ENT: Nontraumatic and normocephalic. Pupils equally  reacting to light and accommodation. Oral mucosa moist. Left eye close, he had left eye removed.  Neck: Supple. No JVD, carotid bruit or thyromegaly. Lymphatics: No lymphadenopathy. Respiratory system: Clear to auscultation. No increased work of breathing. Cardiovascular system: S1 and S2 heard, RRR. No JVD, murmurs, gallops, clicks or pedal edema. Gastrointestinal system: Abdomen is nondistended, soft and nontender. Normal bowel sounds heard. No organomegaly or masses appreciated. Central nervous system: Alert and oriented. No focal neurological deficits. Extremities: Symmetric 5 x 5 power. Right had, sp amputation ring finger.  Skin: No rashes or acute findings. Per patient chronic discoloration of finger are  stable.  Musculoskeletal system: Negative exam. Psychiatry: Pleasant and cooperative.   Labs on Admission:  Basic Metabolic Panel: Recent Labs  Lab 01/08/21 1347 01/08/21 1353  NA 134* 138  K 2.5* 2.5*  CL 102 105  CO2 17*  --   GLUCOSE 136* 130*  BUN 40* 34*  CREATININE 9.39* 10.10*  CALCIUM 7.4*  --    Liver Function Tests: Recent Labs  Lab 01/08/21 1347  AST 8*  ALT 7  ALKPHOS 61  BILITOT 1.0  PROT 5.7*  ALBUMIN 2.6*   No results for input(s): LIPASE, AMYLASE in the last 168 hours. No results for input(s): AMMONIA in the last 168 hours. CBC: Recent Labs  Lab 01/08/21 1347 01/08/21 1353  WBC 5.7  --   NEUTROABS 3.8  --   HGB 7.6* 8.8*  HCT 24.9* 26.0*  MCV 78.1*  --   PLT 187  --    Cardiac Enzymes: No results for input(s): CKTOTAL, CKMB, CKMBINDEX, TROPONINI in the last 168 hours.  BNP (last 3 results) No results for input(s): PROBNP in the last 8760 hours. CBG: Recent Labs  Lab 01/08/21 1343  GLUCAP 156*    Radiological Exams on Admission: CT HEAD CODE STROKE WO CONTRAST  Result Date: 01/08/2021 CLINICAL DATA:  Code stroke.  Neuro deficit, acute, stroke suspected EXAM: CT HEAD WITHOUT CONTRAST TECHNIQUE: Contiguous axial images were  obtained from the base of the skull through the vertex without intravenous contrast. COMPARISON:  December 08, 2020. FINDINGS: Brain: No evidence of acute infarction, hemorrhage, hydrocephalus, extra-axial collection or mass lesion/mass effect. Vascular: No hyperdense vessel identified. Intracranial and extracranial calcific atherosclerosis. Skull: No acute fracture. Chronic thickening of the skin along the occiput/upper neck. Sinuses/Orbits: Moderate paranasal sinus mucosal thickening Other: No acute abnormality. ASPECTS [facility] Stroke Program Early CT Score) total score (0-10 with  10 being normal): 10. IMPRESSION: No evidence of acute abnormality on this mildly motion limited exam. ASPECTS is 10. Code stroke imaging results were communicated on 01/08/2021 at 1:57 pm to provider Dr. Leonel Ramsay via secure text paging. Electronically Signed   By: Margaretha Sheffield M.D.   On: 01/08/2021 13:59    EKG: Independently reviewed. Initial A fib RVR, subsequent sinus.  Assessment/Plan Active Problems:   Essential hypertension, benign   Diabetic retinopathy ([place])   Uncontrolled type 2 diabetes mellitus with hyperglycemia, with long-term current use of insulin (HCC)   Chronic diastolic heart failure (HCC)   Hypokalemia   ESRD (end stage renal disease) (HCC)   Atrial fibrillation with rapid ventricular response ([place])  1-Syncope in setting of A fib RVR and Hypokalemia.  -Plan to admit to Telemetry.  -Replete potasium.  -evaluated initially by neurology for code stroke. Patient back to baseline.   2-A fib RVR;  Converted sinus.  Plan to start Carvedilol.  Stop Cardizem Gtt after patient received carvedilol.  Hold amiodarone due to prolong QT. Repeat EKG in am. Might be able to resume amiodarone if QT prolongation improved.  Cardiology consulted. Patient with recurrent admission for same.  Resume eliquis.   Hypokalemia;  In setting Chronic diarrhea. Might need adjustment of HD K bath.  He received 40  meq times one and 10 meq KCL IV times 2.  Repeat Bmet tonight. Replete potassium as needed.  Check Mg level.   ESRD on HD:  Will need to consult Nephrology in am./  His HD outpatient setting might need to be adjusted.   Diabetes type 2:  SSI, check CBG.   Mild elevation of troponin;  In setting A fib RVR and ESRD.   Chronic Diastolic HF;  Appears compensated.  Volume manage with HD.   PVD, SP right ring finger amputation.  Continue with plavix.  Per patient skin finger changes at baseline.   Anemia, of chronic kidney diseases.  Monitor Hb.   DVT Prophylaxis: Eliquis Code Status: Full Code Family Communication: Wife at bedside Disposition Plan: admit under observation, adjust medication, cardiology consultation.   Time spent:  Given the aforementioned, the predictability of an adverse outcome is felt to be significant. I expect that the patient will require at least 2 midnights in the hospital to treat this condition.  Elmarie Shiley MD [facility]   01/08/2021, 4:59 PM

## 2021-01-08 NOTE — Sedation Documentation (Signed)
Kommor MD notified of critical Troponin value.

## 2021-01-08 NOTE — ED Triage Notes (Signed)
Pt arrived for c/c of code stroke. Per EMS pt was at dialysis and had finished dialysis where he was in his car and stated he was very weak. Pt called 911 @ [phone] and stated he was too weak to move. Upon EMS arrival pt CBG was 78. D10 given, CBG 170. Pt hx of DM.   2 narcan, 200NSS, D10 120HR, 100% RA

## 2021-01-09 DIAGNOSIS — Z992 Dependence on renal dialysis: Secondary | ICD-10-CM

## 2021-01-09 DIAGNOSIS — I10 Essential (primary) hypertension: Secondary | ICD-10-CM

## 2021-01-09 DIAGNOSIS — I5032 Chronic diastolic (congestive) heart failure: Secondary | ICD-10-CM | POA: Diagnosis not present

## 2021-01-09 DIAGNOSIS — I2510 Atherosclerotic heart disease of native coronary artery without angina pectoris: Secondary | ICD-10-CM

## 2021-01-09 DIAGNOSIS — R7989 Other specified abnormal findings of blood chemistry: Secondary | ICD-10-CM | POA: Diagnosis not present

## 2021-01-09 DIAGNOSIS — E1139 Type 2 diabetes mellitus with other diabetic ophthalmic complication: Secondary | ICD-10-CM

## 2021-01-09 DIAGNOSIS — R55 Syncope and collapse: Secondary | ICD-10-CM

## 2021-01-09 DIAGNOSIS — N186 End stage renal disease: Secondary | ICD-10-CM

## 2021-01-09 DIAGNOSIS — I480 Paroxysmal atrial fibrillation: Secondary | ICD-10-CM | POA: Diagnosis not present

## 2021-01-09 DIAGNOSIS — R9431 Abnormal electrocardiogram [ECG] [EKG]: Secondary | ICD-10-CM | POA: Insufficient documentation

## 2021-01-09 DIAGNOSIS — E876 Hypokalemia: Secondary | ICD-10-CM

## 2021-01-09 DIAGNOSIS — R778 Other specified abnormalities of plasma proteins: Secondary | ICD-10-CM

## 2021-01-09 DIAGNOSIS — D631 Anemia in chronic kidney disease: Secondary | ICD-10-CM

## 2021-01-09 DIAGNOSIS — I4891 Unspecified atrial fibrillation: Secondary | ICD-10-CM | POA: Diagnosis not present

## 2021-01-09 LAB — CBG MONITORING, ED
Glucose-Capillary: 94 mg/dL (ref 70–99)
Glucose-Capillary: 81 mg/dL (ref 70–99)

## 2021-01-09 LAB — BASIC METABOLIC PANEL
Anion gap: 10 (ref 5–15)
BUN: 45 mg/dL — ABNORMAL HIGH (ref 6–20)
CO2: 19 mmol/L — ABNORMAL LOW (ref 22–32)
Calcium: 7.3 mg/dL — ABNORMAL LOW (ref 8.9–10.3)
Chloride: 106 mmol/L (ref 98–111)
Creatinine, Ser: 9.95 mg/dL — ABNORMAL HIGH (ref 0.61–1.24)
GFR, Estimated: 6 mL/min — ABNORMAL LOW (ref >60)
Glucose, Bld: 103 mg/dL — ABNORMAL HIGH (ref 70–99)
Potassium: 3.9 mmol/L (ref 3.5–5.1)
Sodium: 135 mmol/L (ref 135–145)

## 2021-01-09 LAB — RESP PANEL BY RT-PCR (FLU A&B, COVID) ARPGX2
Influenza A by PCR: NEGATIVE
Influenza B by PCR: NEGATIVE
SARS Coronavirus 2 by RT PCR: NEGATIVE

## 2021-01-09 LAB — CBC
HCT: 23.6 % — ABNORMAL LOW (ref 39.0–52.0)
Hemoglobin: 7 g/dL — ABNORMAL LOW (ref 13.0–17.0)
MCH: 23.5 pg — ABNORMAL LOW (ref 26.0–34.0)
MCHC: 29.7 g/dL — ABNORMAL LOW (ref 30.0–36.0)
MCV: 79.2 fL — ABNORMAL LOW (ref 80.0–100.0)
Platelets: 189 10*3/uL (ref 150–400)
RBC: 2.98 MIL/uL — ABNORMAL LOW (ref 4.22–5.81)
RDW: 19.8 % — ABNORMAL HIGH (ref 11.5–15.5)
WBC: 5.6 10*3/uL (ref 4.0–10.5)
nRBC: 0.9 % — ABNORMAL HIGH (ref 0.0–0.2)

## 2021-01-09 MED ORDER — AMIODARONE HCL 200 MG PO TABS: 200.0000 mg | ORAL_TABLET | Freq: Every day | ORAL | 1 refills | Status: DC

## 2021-01-09 MED ORDER — DARBEPOETIN ALFA 60 MCG/0.3ML IJ SOSY
60.0000 ug | PREFILLED_SYRINGE | Freq: Once | INTRAMUSCULAR | Status: AC
  Administered 2021-01-09: 60 ug via SUBCUTANEOUS
  Filled 2021-01-09: qty 0.3

## 2021-01-09 NOTE — Consult Note (Addendum)
Cardiology Consultation:   Patient ID: [name]. MRN: [id number]; DOB: 07/31/1970  Admit date: 01/08/2021 Date of Consult: 01/09/2021  PCP:  [name], MD   [name] Providers Cardiologist:  Larae Grooms, MD   {  Patient Profile:   [name]. is a 50 y.o. 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 Grandville Silos.  Patient with multiple episode of syncope/near syncope in the past few months.  Mostly occurring after dialysis session.  He was noted to be in hypertensive as well as A. fib RVR.  Admitted to [facility] from 10/3 - 11/27/2020 for evaluation of presyncope and confusion while at HD. Was found to be in atrial fibrillation with RVR upon arrival and received IV Cardizem and converted back to NSR. His Hs Troponin values peaked at 1556 which was felt to be secondary to demand ischemia as he denied any recent anginal symptoms and echo that admission showed a preserved EF of 55-60% with no regional WMA. Was recommended he have a Lexiscan Myoview due to NSVT during admission but the patient declined and wanted to have the test as an outpatient. Zio was placed at discharge. Discharged on amiodarone.   Admitted October 17 to 18 2022 at [facility] for presyncope and dizziness after dialysis session.  He was found in A. fib RVR.  Placed on IV Cardizem with conversion to sinus rhythm.  He was not taking amiodarone as prescribed, increase amiodarone to 200 mg for 2 weeks and then daily.  Continued plan to complete stress test in outpatient setting.  Did not mail back his Zio patch.  History of Present Illness:   Mr.  [name] brought in by EMS for evaluation of syncope.  Patient did not felt after his dialysis yesterday.  He feels weak and dizzy while driving.  Next thing he remembers waking up in the driveway.  EMS was called and he was found hypotensive, hypoglycemic with blood sugar of 78 and A. fib RVR at heart rate of 100s.  He was given D10 with improvement of blood sugar.  He was given IV Cardizem 10 mg on ER arrival.  Subsequently underwent cardioversion x2 at 200 J with unsuccessful attempt.  Placed on Cardizem drip with eventual conversion to sinus rhythm.  He is maintaining sinus rhythm since yesterday afternoon.  He denies chest pain, shortness of breath.  Patient reports multiple syncope episodes after dialysis.  He does not take his carvedilol prior to his dialysis.  He does dialysis on Tuesday and Thursday.  Reports he has worn a monitor but has not mailed it back.  He denies any chest pain.  Patient has chronic diarrhea from gastroparesis.  He has diarrhea after he eats anything.  In ER his blood sugar was 81 during my conversation.  K 2.5>>3.4>>3.9 Hs-Troponin 121>>> 124 HGb 7.0  QT/QTcB 541/554 ms on admit>>. Held amiodarone >> QT/QTcB 453/471 ms Patient reports he continues to take amiodarone 200 mg twice daily despite recommended to reduce once daily dose after 2 weeks after last admission.  Past Medical History:  Diagnosis Date   Anxiety    Cellulitis  CHF (congestive heart failure) (HCC)    Chronic diarrhea    Chronic kidney disease    Chronic pain of both ankles    Diabetes mellitus without complication (HCC)    Diabetes mellitus, type II (HCC)    Diabetic neuropathy (HCC)    Frequent falls    Gait instability    GERD (gastroesophageal reflux disease)    Heart palpitations    History of kidney stones    HLD (hyperlipidemia)    Hypertension    Insomnia    Nausea and vomiting in adult    recurrent    Past Surgical History:  Procedure Laterality Date   AMPUTATION Left  03/16/2016   Procedure: AMPUTATION DIGIT LEFT HALLUX;  Surgeon: Trula Slade, DPM;  Location: [place];  Service: Podiatry;  Laterality: Left;  can start around 5    ARTHROSCOPIC REPAIR ACL Left    AV FISTULA PLACEMENT Right 05/31/2019   Procedure: Brachiocephalic AV fistula creation;  Surgeon: Algernon Huxley, MD;  Location: [name] ORS;  Service: Vascular;  Laterality: Right;   COLONOSCOPY WITH PROPOFOL N/A 10/28/2015   Procedure: COLONOSCOPY WITH PROPOFOL;  Surgeon: Lollie Sails, MD;  Location: [facility] ENDOSCOPY;  Service: Endoscopy;  Laterality: N/A;   COLONOSCOPY WITH PROPOFOL N/A 10/29/2015   Procedure: COLONOSCOPY WITH PROPOFOL;  Surgeon: Lollie Sails, MD;  Location: [facility] ENDOSCOPY;  Service: Endoscopy;  Laterality: N/A;   CORONARY/GRAFT ACUTE MI REVASCULARIZATION N/A 02/26/2020   Procedure: Coronary/Graft Acute MI Revascularization;  Surgeon: Jettie Booze, MD;  Location: [place] CV LAB;  Service: Cardiovascular;  Laterality: N/A;   DIALYSIS/PERMA CATHETER INSERTION Right 04/26/2019   Perm Cath    DIALYSIS/PERMA CATHETER INSERTION N/A 04/26/2019   Procedure: DIALYSIS/PERMA CATHETER INSERTION;  Surgeon: Algernon Huxley, MD;  Location: [place] CV LAB;  Service: Cardiovascular;  Laterality: N/A;   DIALYSIS/PERMA CATHETER REMOVAL N/A 08/15/2019   Procedure: DIALYSIS/PERMA CATHETER REMOVAL;  Surgeon: Katha Cabal, MD;  Location: [place] [facility] LAB;  Service: Cardiovascular;  Laterality: N/A;   ESOPHAGOGASTRODUODENOSCOPY (EGD) WITH PROPOFOL N/A 12/27/2017   Procedure: ESOPHAGOGASTRODUODENOSCOPY (EGD) WITH PROPOFOL;  Surgeon: Toledo, Benay Pike, MD;  Location: [name] ENDOSCOPY;  Service: Gastroenterology;  Laterality: N/A;   INTRAVASCULAR ULTRASOUND/IVUS N/A 02/26/2020   Procedure: Intravascular Ultrasound/IVUS;  Surgeon: Jettie Booze, MD;  Location: [place] [facility] LAB;  Service: Cardiovascular;  Laterality: N/A;   LEFT HEART CATH AND CORONARY ANGIOGRAPHY N/A 02/26/2020    Procedure: LEFT HEART CATH AND CORONARY ANGIOGRAPHY;  Surgeon: Jettie Booze, MD;  Location: [place] [facility] LAB;  Service: Cardiovascular;  Laterality: N/A;   PROSTATE SURGERY  2016   TONSILECTOMY/ADENOIDECTOMY WITH MYRINGOTOMY     TONSILLECTOMY     UPPER EXTREMITY ANGIOGRAPHY Right 11/26/2020   Procedure: Upper Extremity Angiography;  Surgeon: Algernon Huxley, MD;  Location: [place] [facility] LAB;  Service: Cardiovascular;  Laterality: Right;     Inpatient Medications: Scheduled Meds:  apixaban  5 mg Oral BID   atorvastatin  80 mg Oral QHS   calcium acetate  2,001 mg Oral TID WC   carvedilol  25 mg Oral BID WC   clopidogrel  75 mg Oral Daily   ferrous sulfate  325 mg Oral Daily   insulin aspart  0-6 Units Subcutaneous TID WC   tamsulosin  0.4 mg Oral QPC breakfast   Continuous Infusions:  PRN Meds: acetaminophen **OR** acetaminophen, hydrALAZINE, nitroGLYCERIN, oxyCODONE  Allergies:    Allergies  Allergen Reactions   Levofloxacin Swelling  Of face Other reaction(s): swelling   Promethazine Diarrhea and Other (See Comments)    Other reaction(s): Other (See Comments), Other (See Comments) Other reaction(s): Muscle cramps Muscle cramps Cramping Other reaction(s): Muscle cramps Muscle cramps   Other     Other reaction(s): Unknown   Malt Other (See Comments)    Other reaction(s): cramping    Social History:   Social History   Socioeconomic History   Marital status: Married    Spouse name: [name]    Number of children: 2   Years of education: Not on file   Highest education level: High school graduate  Occupational History   Occupation: Disability    Comment: not employed  Tobacco Use   Smoking status: Never   Smokeless tobacco: Never  Vaping Use   Vaping Use: Never used  Substance and Sexual Activity   Alcohol use: No   Drug use: No   Sexual activity: Yes  Other Topics Concern   Not on file  Social History Narrative   Oldest son killed in car crash  June 2020.    Social Determinants of Health   Financial Resource Strain: Not on file  Food Insecurity: Not on file  Transportation Needs: No Transportation Needs   Lack of Transportation (Medical): No   Lack of Transportation (Non-Medical): No  Physical Activity: Not on file  Stress: Not on file  Social Connections: Not on file  Intimate Partner Violence: Not on file    Family History:   Family History  Problem Relation Age of Onset   CAD Father    Stroke Father    Diabetes Mellitus II Mother    Kidney failure Mother    Schizophrenia Mother      ROS:  Please see the history of present illness.  All other ROS reviewed and negative.     Physical Exam/Data:   Vitals:   01/09/21 0800 01/09/21 0900 01/09/21 0915 01/09/21 1015  BP: (!) 156/79 111/62 111/64 126/68  Pulse: 64 62 62 65  Resp: 11 15 12 13   Temp:      TempSrc:      SpO2: 97% 96% 90% 100%  Weight:      Height:        Intake/Output Summary (Last 24 hours) at 01/09/2021 1211 Last data filed at 01/09/2021 0018 Gross per 24 hour  Intake 1075.86 ml  Output --  Net 1075.86 ml   Last 3 Weights 01/08/2021 12/08/2020 12/08/2020  Weight (lbs) 205 lb 7.5 oz 201 lb 15.1 oz 200 lb  Weight (kg) 93.2 kg 91.6 kg 90.719 kg  Some encounter information is confidential and restricted. Go to Review Flowsheets activity to see all data.     Body mass index is 25.68 kg/m.  General:  Well nourished, well developed, in no acute distress HEENT: normal Neck: no JVD Vascular: No carotid bruits; Distal pulses 2+ bilaterally Cardiac:  normal S1, S2; RRR; no murmur  Lungs:  clear to auscultation bilaterally, no wheezing, rhonchi or rales  Abd: soft, nontender, no hepatomegaly  Ext: no edema Musculoskeletal:  No deformities, BUE and BLE strength normal and equal Skin: warm and dry  Neuro:  CNs 2-12 intact, no focal abnormalities noted Psych:  Normal affect   EKG:  The EKG was personally reviewed and demonstrates:  SR at rate  65 bpm Telemetry:  Telemetry was personally reviewed and demonstrates:  sinus rhythm  Relevant CV Studies:  Echo 11/25/2020   1. Left ventricular ejection fraction, by estimation, is 55  to 60%. The  left ventricle has normal function. The left ventricle has no regional  wall motion abnormalities. The left ventricular internal cavity size was  borderline dilated. There is moderate   left ventricular hypertrophy. Left ventricular diastolic parameters are  consistent with Grade II diastolic dysfunction (pseudonormalization).  Elevated left atrial pressure. The average left ventricular global  longitudinal strain is -13.0 %. The global  longitudinal strain is abnormal.   2. Right ventricular systolic function is normal. The right ventricular  size is mildly enlarged. Tricuspid regurgitation signal is inadequate for  assessing PA pressure.   3. Left atrial size was mild to moderately dilated.   4. Right atrial size was mildly dilated.   5. There is a small to moderate pericardial effusion. There is no  evidence of cardiac tamponade.   6. The mitral valve is normal in structure. Trivial mitral valve  regurgitation. No evidence of mitral stenosis.   7. The aortic valve is tricuspid. Aortic valve regurgitation is not  visualized. No aortic stenosis is present.   8. Aortic dilatation noted. There is borderline dilatation of the aortic  root, measuring 38 mm.   9. Mildly dilated pulmonary artery.  10. The inferior vena cava is dilated in size with >50% respiratory  variability, suggesting right atrial pressure of 8 mmHg.   Coronary/Graft Acute MI Revascularization  02/26/2020  Intravascular Ultrasound/IVUS  LEFT HEART CATH AND CORONARY ANGIOGRAPHY   Conclusion    The left ventricular systolic function is normal. LV end diastolic pressure is normal. The left ventricular ejection fraction is greater than 65% by visual estimate. There is no aortic valve stenosis. Mid LAD lesion is 90%  stenosed. A drug-eluting stent was successfully placed using a STENT RESOLUTE ONYX 3.5X30, postdilated to > 4 mm. Stent optimized with IVUS. Post intervention, there is a 0% residual stenosis.   I suspect his abnormal ECG and anterior injury pattern was more from demand ischemia in the setting of hypotension and AFib with RVR, with severe mid LAD lesion.     During the cath, he converted to NSR briefly and his pulse pressure and BP returned to normal without any pressors. In addition, LVEDP only 11 mm Hg despite having 2.4 L NS administered on transport.  He may have been a little dehydrated after 3 L fluid removal after dialysis.    In regards to AFib.  Continue IV Amiodarone.  Will have to decide if he needs longterm anticoagulation along with antiplatelet therapy.  If so, would change Brilinta to clopidogrel.  Would have to check what options work in the setting of ESRD.   Laboratory Data:  High Sensitivity Troponin:   Recent Labs  Lab 01/08/21 1352 01/08/21 1807  TROPONINIHS 121* 124*     Chemistry Recent Labs  Lab 01/08/21 1347 01/08/21 1353 01/08/21 1807 01/09/21 0244  NA 134* 138 136 135  K 2.5* 2.5* 3.4* 3.9  CL 102 105 107 106  CO2 17*  --  18* 19*  GLUCOSE 136* 130* 99 103*  BUN 40* 34* 41* 45*  CREATININE 9.39* 10.10* 9.21* 9.95*  CALCIUM 7.4*  --  7.2* 7.3*  MG  --   --  1.8  --   GFRNONAA 6*  --  6* 6*  ANIONGAP 15  --  11 10    Recent Labs  Lab 01/08/21 1347  PROT 5.7*  ALBUMIN 2.6*  AST 8*  ALT 7  ALKPHOS 61  BILITOT 1.0    Hematology Recent Labs  Lab 01/08/21 1347 01/08/21 1353 01/09/21 0244  WBC 5.7  --  5.6  RBC 3.19*  --  2.98*  HGB 7.6* 8.8* 7.0*  HCT 24.9* 26.0* 23.6*  MCV 78.1*  --  79.2*  MCH 23.8*  --  23.5*  MCHC 30.5  --  29.7*  RDW 20.0*  --  19.8*  PLT 187  --  189    Radiology/Studies:  CT HEAD CODE STROKE WO CONTRAST  Result Date: 01/08/2021 CLINICAL DATA:  Code stroke.  Neuro deficit, acute, stroke suspected EXAM:  CT HEAD WITHOUT CONTRAST TECHNIQUE: Contiguous axial images were obtained from the base of the skull through the vertex without intravenous contrast. COMPARISON:  December 08, 2020. FINDINGS: Brain: No evidence of acute infarction, hemorrhage, hydrocephalus, extra-axial collection or mass lesion/mass effect. Vascular: No hyperdense vessel identified. Intracranial and extracranial calcific atherosclerosis. Skull: No acute fracture. Chronic thickening of the skin along the occiput/upper neck. Sinuses/Orbits: Moderate paranasal sinus mucosal thickening Other: No acute abnormality. ASPECTS [facility] Stroke Program Early CT Score) total score (0-10 with 10 being normal): 10. IMPRESSION: No evidence of acute abnormality on this mildly motion limited exam. ASPECTS is 10. Code stroke imaging results were communicated on 01/08/2021 at 1:57 pm to provider Dr. Leonel Ramsay via secure text paging. Electronically Signed   By: Margaretha Sheffield M.D.   On: 01/08/2021 13:59    Assessment and Plan:   Recurrent syncope - Multiple episode of near syncope/syncope after dialysis session.  He was found to be hypotensive, hyperglycemic, hypokalemic as well as A. fib RVR. -?  He is feeling weak with due to too much fluid taken out however reports stable dry weight. -He has wore monitor but has not returned back yet -Patient also has chronic diarrhea after eating any meals due to gastroparesis from diabetes mellitus -He is syncope could be multifactorial -He reports holding carvedilol prior to his dialysis session but has history of noncompliance  2.  Paroxysmal atrial fibrillation with rapid ventricular rate -Multiple A. fib RVR episode in past few months after dialysis session during syncope. -Yesterday his heart rate was in 110-130s range -Unsuccessful cardioversion attempt x2 yesterday however spontaneously converted to sinus rhythm on IV diltiazem  -Continue Eliquis for anticoagulation -Amiodarone as below -Continue  carvedilol 25 mg twice daily (hold a.m. of dialysis session) -Patient reports he has worn a monitor but has not returned yet.  I have at length discussion regarding importance of medication compliance and correlation of A. fib with syncope. -He says he will return monitor upon discharge  3. Prolonged QT -Patient reports he continues to take amiodarone 200 mg twice daily despite recommended to reduce once daily dose after 2 weeks after last admission. -- QT/QTcB 541/554 ms on admit>>. Held amiodarone >> QT/QTcB 453/471 ms today - Will resume amiodarone to 200mg  daily   4. CAD with mildly elevated troponin  -He is s/p anterior STEMI in 02/2020 with DES to mid-LAD -Outpatient cardiac stress test recommended following admission of early October.  At that time his troponin peaked at 1556.  Stress is still pending. There was no residual dx after cath.  - Hs-Troponin 121>>> 124 - Thankfully no CP - Continue Plavix 75 mg daily, Lipitor 80 mg daily and carvedilol as above  5.  Pericardial effusion -Echocardiogram November 25, 2020 showed mild to moderate pericardial effusion without evidence of tamponade  6. Chronic Anemia - Hgb 7.0 - Keep Hg above 7 - Transfuse per primary   Risk Assessment/Risk Scores:   CHA2DS2-VASc Score =  3  This indicates a 3.2% annual risk of stroke. The patient's score is based upon: CHF History: 1 HTN History: 1 Diabetes History: 0 Stroke History: 0 Vascular Disease History: 1 Age Score: 0 Gender Score: 0  For questions or updates, please contact [place] Please consult [email or web address] for contact info under    Jarrett Soho, [place]  01/09/2021 12:11 PM   Patient seen and examined with [facility].  Agree as above, with the following exceptions and changes as noted below.  50 year old gentleman with recent anterior STEMI with DES to the mid LAD, recurrent syncope after dialysis sessions, ESRD, paroxysmal atrial fibrillation on amiodarone and  Eliquis, diabetes, hypertension, hyperlipidemia.  Cardiology consulted for recurrent syncope after dialysis sessions and atrial fibrillation with rapid ventricular response now resolved. He however tells me that he takes his medications daily and only misses carvedilol on the morning of his dialysis runs, he has been instructed to take it after dialysis.  A few minutes after leaving dialysis he will feel woozy and recently had a syncopal episode while driving.  Presyncope and syncope have happened frequently after dialysis and he has been found to be in atrial fibrillation with rapid ventricular response as well.  He was started on IV diltiazem for A. fib RVR on presentation and has subsequently spontaneously converted to sinus rhythm rates in the 60s.  Gen: NAD, CV: RRR, soft systolic murmur, dialysis fistula in the right arm lungs: clear, Abd: soft, Extrem: Warm, well perfused, no edema, Neuro/Psych: alert and oriented x 3, normal mood and affect. All available labs, radiology testing, previous records reviewed.  Patient has worn a cardiac monitor for recurrent syncope but has not returned it yet.  He assures me he will return it upon dismissal home.  Recent echocardiogram relatively unremarkable aside from pericardial effusion which is likely secondary to ESRD.  No features of tamponade on that echo, overall hemodynamically stable today with hypertension and normal heart rate.  We will make close follow-up in cardiology clinic where results of the monitor can be evaluated.  I stressed to the patient medical compliance as well as discussing with his nephrologist if he is feeling poorly after every dialysis run leading to syncope.  No further cardiovascular work-up required inpatient, close follow-up recommended, patient has been recommended to have outpatient stress testing but has not been able to present for these appointments.  If outpatient stress test is coordinated this should be performed at [facility] on the D SPECT camera given patient's prior history of CAD.    Elouise Munroe, MD 01/09/21 3:56 PM

## 2021-01-09 NOTE — Discharge Summary (Signed)
Physician Discharge Summary  [name]. [name] DOB: [birth year] DOA: 01/08/2021  PCP: [name] Schwalbe, MD  Admit date: 01/08/2021 Discharge date: 01/09/2021  Time spent: 60 minutes  Recommendations for Outpatient Follow-up:  Follow-up with [name] Montana, NP, cardiology 02/03/2021 at 3:10 PM. Follow-up with Dr. Irish Macdonald, cardiology as previously scheduled. Follow-up at hemodialysis center as scheduled on 01/10/2021. Follow-up with [name] Schwalbe, MD in 1 to 2 weeks.  On follow-up patient will need a basic metabolic profile, magnesium level done to follow-up on electrolytes and renal function.  CBC also need to be followed up upon.   Discharge Diagnoses:  Principal Problem:   Syncope Active Problems:   Essential hypertension, benign   Diabetic retinopathy ([place])   Uncontrolled type 2 diabetes mellitus with hyperglycemia, with long-term current use of insulin (HCC)   Chronic diastolic heart failure (HCC)   Hypokalemia   ESRD (end stage renal disease) (HCC)   Atrial fibrillation with rapid ventricular response ([place])   Discharge Condition: Stable and improved  Diet recommendation: Heart healthy  Filed Weights   01/08/21 1300  Weight: 93.2 kg    History of present illness:  HPI per Dr. Vic Ripper Fairley Macdonald. is a 50 y.o. male with past medical history significant for ESRD on hemodialysis, diabetes type 2 insulin-dependent, A. fib on Eliquis, who presents with unresponsiveness.  Patient has history of recurrent syncope, last admission for same last month. Today he was driving home after hemodialysis.  He was feeling lightheaded on the way home.  He called 911 after pulling into his driveway.  He does not remember anything  after that.  On EMS arrival patient was found to be hypotensive with systolic blood pressure in the 90s, CBG 78, unresponsive.  He was given fluids D10 and improved in route.  Time to arrival to the ED he was alert and interactive.   Patient  was found to be in A. fib with RVR on arrival to the ED. patient received 10 mg of IV Cardizem in the ED which did not convert his rhythm.  Subsequently ED physician perform bedside cardioversion with 2 shocks at 200 J, unsuccessful cardioversion.  Subsequently patient was a started on Cardizem drip. Currently patient in sinus rhythm.    He denies chest pain or dyspnea. He has history of chronic diarrhea, from gastroparesis, he has bowel movement every time after he eats. He denies headaches.    He report chronic hip pain  Hospital Course:  #1 recurrent syncope in the setting of A. fib with RVR and hypokalemia -Patient with recurrent admissions for recurrent syncope occurring usually after hemodialysis sessions. -Patient noted to be hypotensive on presentation when EMS noted blood pressure in the 90s, noted to be hypokalemic and in A. fib with RVR. -Recurrent syncopal episodes likely secondary to excess fluid being removed during hemodialysis sessions, significant hypokalemia and as such potassium bath may be needed to be adjusted during hemodialysis in the setting of A. fib. -Patient noted to have been discharged on a Holter monitor during prior hospitalization however noted to have not returned it back yet. -Patient also noted with chronic diarrhea after eating meals due to gastroparesis from diabetes. -Patient seen in consultation by nephrology and compliance recommended to patient as noted to be having hemodialysis twice a week instead of 3 times weekly. -Hemodialysis recommended tomorrow per nephrology however patient refusing. -Patient's electrolytes repleted. -Patient is also seen in consultation by cardiology and no further cardiac work-up recommended at this time. -We will need outpatient follow-up  with cardiology, at his hemodialysis session, with PCP.  2.  Paroxysmal A. fib with RVR -Patient noted to be in A. fib with RVR on presentation with syncopal episode noted to be a recurrent  issue. -Patient noted to have be hypokalemic on admission and electrolytes repleted. -Patient also noted to have QT prolongation which has resolved. -Cardioversion attempted x2 in the ED which was unsuccessful and patient placed on a Cardizem drip. -Patient subsequently converted spontaneously to normal sinus rhythm and placed on oral Coreg. -Patient noted to still be on amiodarone 200 mg twice daily despite on last discharge being told to decrease it to once daily. -Cardiology consulted and recommended patient continue carvedilol 25 mg twice daily and hold in the morning of dialysis sessions and to continue amiodarone 200 mg daily with close outpatient follow-up in cardiology clinic and when he returns his monitor could be further evaluated. -Patient to continue on Eliquis. -Per cardiology no further cardiac work-up needed.  3.  Prolonged QT -On admission QTC noted at 554. -Amiodarone initially held. -Electrolytes repleted and QT prolongation resolved with QTC on repeat EKG at 471. -Cardiology recommending resumption of amiodarone at 200 mg daily. -Outpatient follow-up.  4.  CAD/mildly elevated troponin -Likely secondary to demand ischemia from problem #1, electrolyte abnormalities, A. fib with RVR. -Patient noted to have had an anterior STEMI in 02/2020 with DES to the mid LAD. -Outpatient cardiac stress test recommended by cardiology during last hospitalization in early October at which time his troponins peaked at 1556. -Patient currently asymptomatic. -Cardiology recommending continuation of Plavix, Lipitor, carvedilol. -Outpatient follow-up with cardiology.  5.  Hypokalemia -In the setting of chronic diarrhea, may need adjustments of HD K bath. -Repleted.  6. end-stage renal disease on HD -Patient noted to only go twice per week for hemodialysis and noted to be started off early due to cramping. -Patient seen in consultation by nephrology, HD offered for 01/10/2021 however patient  noted to have declined and does not want hemodialysis done tomorrow and wants to hold off. -Patient insistent on being discharged. -Case discussed with nephrology and okay for patient to be discharged with close outpatient follow-up with his primary nephrologist. -Potassium bath and amount of fluid drawn off during HD will need to be reassessed at his outpatient hemodialysis center.  7.  Anemia -Likely anemia of chronic disease secondary to ESRD. -Patient given a dose of aranesp during this hospitalization. -Outpatient follow-up.  8.  Medication noncompliance -Patient noted to skip hemodialysis once a week and noted to be signing off early.  Nephrology.  Nephrology discussed risk of shortening and skipping hemodialysis with patient.  9.  Diabetes mellitus type 2 -Patient noted has been off oral hypoglycemic agents as he states his A1c improved significantly. -Hemoglobin A1c 5.2 (11/24/2020).  10.  PVD/status post right ring finger amputation -Continue Plavix, statin.   Procedures: Unsuccessful cardioversion attempt per [name] 01/08/2021   Consultations: Nephrology: Dr. Marval Regal 01/09/2021 Cardiology: Dr. Alveda Reasons 01/09/2021 Neurology: Dr. Leonel Ramsay 01/08/2021  Discharge Exam: Vitals:   01/09/21 1630 01/09/21 1700  BP: (!) 176/88 (!) 169/78  Pulse: 67 66  Resp: 16 15  Temp:    SpO2: 100% 99%    General: NAD Cardiovascular: RRR no murmurs rubs or gallops.  No JVD.  No lower extremity edema. Respiratory: Lungs clear to auscultation bilaterally.  No wheezes, no crackles, no rhonchi.  Discharge Instructions   Discharge Instructions     Diet - low sodium heart healthy   Complete by: As directed  1200 cc/day fluid restriction   Increase activity slowly   Complete by: As directed       Allergies as of 01/09/2021       Reactions   Levofloxacin Swelling   Of face Other reaction(s): swelling   Promethazine Diarrhea, Other (See Comments)   Other reaction(s):  Other (See Comments), Other (See Comments) Other reaction(s): Muscle cramps Muscle cramps Cramping Other reaction(s): Muscle cramps Muscle cramps   Other    Other reaction(s): Unknown   Malt Other (See Comments)   Other reaction(s): cramping        Medication List     TAKE these medications    amiodarone 200 MG tablet Commonly known as: Pacerone Take 1 tablet (200 mg total) by mouth daily. 1 twice daily for 2 weeks, then 1 daily What changed:  how much to take how to take this when to take this   apixaban 5 MG Tabs tablet Commonly known as: ELIQUIS Take 1 tablet (5 mg total) by mouth 2 (two) times daily.   atorvastatin 80 MG tablet Commonly known as: LIPITOR TAKE 1 TABLET (80 MG TOTAL) BY MOUTH AT BEDTIME.   azelastine 0.1 % nasal spray Commonly known as: ASTELIN Place 2 sprays into both nostrils 2 (two) times daily.   calcium acetate 667 MG capsule Commonly known as: PHOSLO Take 3 capsules (2,001 mg total) by mouth 3 (three) times daily with meals.   carvedilol 25 MG tablet Commonly known as: COREG Take 1 tablet (25 mg total) by mouth 2 (two) times daily with a meal.   clopidogrel 75 MG tablet Commonly known as: PLAVIX Take 1 tablet (75 mg total) by mouth daily.   FeroSul 325 (65 FE) MG tablet Generic drug: ferrous sulfate Take 325 mg by mouth daily.   fluticasone 50 MCG/ACT nasal spray Commonly known as: FLONASE Place 2 sprays into both nostrils daily.   lidocaine-prilocaine cream Commonly known as: EMLA Apply 1 application topically Every Tuesday,Thursday,and Saturday with dialysis.   nitroGLYCERIN 0.4 MG SL tablet Commonly known as: NITROSTAT Place 1 tablet (0.4 mg total) under the tongue every 5 (five) minutes as needed for chest pain.   oxyCODONE 5 MG immediate release tablet Commonly known as: Oxy IR/ROXICODONE Take 1 tablet by mouth every 4 (four) hours as needed.   prednisoLONE acetate 1 % ophthalmic suspension Commonly known as: PRED  FORTE Place 1 drop into the left eye every 4 (four) hours.   tamsulosin 0.4 MG Caps capsule Commonly known as: FLOMAX Take 0.4 mg by mouth daily after breakfast.       Allergies  Allergen Reactions   Levofloxacin Swelling    Of face Other reaction(s): swelling   Promethazine Diarrhea and Other (See Comments)    Other reaction(s): Other (See Comments), Other (See Comments) Other reaction(s): Muscle cramps Muscle cramps Cramping Other reaction(s): Muscle cramps Muscle cramps   Other     Other reaction(s): Unknown   Malt Other (See Comments)    Other reaction(s): cramping    Follow-up Information     Loel Dubonnet, NP Follow up on 02/03/2021.   Specialty: Cardiology Why: @3 :10pm for hospital cardiology follow up Contact information: [place] [address] [phone]         [name] Schwalbe, MD. Schedule an appointment as soon as possible for a visit in 1 week(s).   Specialty: Family Medicine Why: Follow-up in 1 to 2 weeks Contact information: [address] [place] [address] [phone]  Jettie Booze, MD .   Specialties: Cardiology, Radiology, Interventional Cardiology Contact information: [address] [place] [address] [phone]         Hemodialysis center Follow up on 01/10/2021.   Why: Follow-up at your hemodialysis center as scheduled on 01/10/2021.                 The results of significant diagnostics from this hospitalization (including imaging, microbiology, ancillary and laboratory) are listed below for reference.    Significant Diagnostic Studies: CT HEAD CODE STROKE WO CONTRAST  Result Date: 01/08/2021 CLINICAL DATA:  Code stroke.  Neuro deficit, acute, stroke suspected EXAM: CT HEAD WITHOUT CONTRAST TECHNIQUE: Contiguous axial images were obtained from the base of the skull through the vertex without intravenous contrast. COMPARISON:  December 08, 2020.  FINDINGS: Brain: No evidence of acute infarction, hemorrhage, hydrocephalus, extra-axial collection or mass lesion/mass effect. Vascular: No hyperdense vessel identified. Intracranial and extracranial calcific atherosclerosis. Skull: No acute fracture. Chronic thickening of the skin along the occiput/upper neck. Sinuses/Orbits: Moderate paranasal sinus mucosal thickening Other: No acute abnormality. ASPECTS [facility] Stroke Program Early CT Score) total score (0-10 with 10 being normal): 10. IMPRESSION: No evidence of acute abnormality on this mildly motion limited exam. ASPECTS is 10. Code stroke imaging results were communicated on 01/08/2021 at 1:57 pm to provider Dr. Leonel Ramsay via secure text paging. Electronically Signed   By: Margaretha Sheffield M.D.   On: 01/08/2021 13:59    Microbiology: Recent Results (from the past 240 hour(s))  Resp Panel by RT-PCR (Flu A&B, Covid) Nasopharyngeal Swab     Status: None   Collection Time: 01/09/21  7:57 AM   Specimen: Nasopharyngeal Swab; Nasopharyngeal(NP) swabs in vial transport medium  Result Value Ref Range Status   SARS Coronavirus 2 by RT PCR NEGATIVE NEGATIVE Final    Comment: (NOTE) SARS-CoV-2 target nucleic acids are NOT DETECTED.  The SARS-CoV-2 RNA is generally detectable in upper respiratory specimens during the acute phase of infection. The lowest concentration of SARS-CoV-2 viral copies this assay can detect is 138 copies/mL. A negative result does not preclude SARS-Cov-2 infection and should not be used as the sole basis for treatment or other patient management decisions. A negative result may occur with  improper specimen collection/handling, submission of specimen other than nasopharyngeal swab, presence of viral mutation(s) within the areas targeted by this assay, and inadequate number of viral copies(<138 copies/mL). A negative result must be combined with clinical observations, patient history, and epidemiological information. The  expected result is Negative.  Fact Sheet for Patients:  EntrepreneurPulse.com.au  Fact Sheet for Healthcare Providers:  IncredibleEmployment.be  This test is no t yet approved or cleared by the [place] FDA and  has been authorized for detection and/or diagnosis of SARS-CoV-2 by FDA under an Emergency Use Authorization (EUA). This EUA will remain  in effect (meaning this test can be used) for the duration of the COVID-19 declaration under Section 564(b)(1) of the Act, 21 U.S.C.section 360bbb-3(b)(1), unless the authorization is terminated  or revoked sooner.       Influenza A by PCR NEGATIVE NEGATIVE Final   Influenza B by PCR NEGATIVE NEGATIVE Final    Comment: (NOTE) The Xpert Xpress SARS-CoV-2/FLU/RSV plus assay is intended as an aid in the diagnosis of influenza from Nasopharyngeal swab specimens and should not be used as a sole basis for treatment. Nasal washings and aspirates are unacceptable for Xpert Xpress SARS-CoV-2/FLU/RSV testing.  Fact Sheet for Patients: EntrepreneurPulse.com.au  Fact Sheet for Healthcare Providers: IncredibleEmployment.be  This test is not yet approved or cleared by the [place] FDA and has been authorized for detection and/or diagnosis of SARS-CoV-2 by FDA under an Emergency Use Authorization (EUA). This EUA will remain in effect (meaning this test can be used) for the duration of the COVID-19 declaration under Section 564(b)(1) of the Act, 21 U.S.C. section 360bbb-3(b)(1), unless the authorization is terminated or revoked.  Performed at [facility], [name] [address], [place], [place] [address]      Labs: Basic Metabolic Panel: Recent Labs  Lab 01/08/21 1347 01/08/21 1353 01/08/21 1807 01/09/21 0244  NA 134* 138 136 135  K 2.5* 2.5* 3.4* 3.9  CL 102 105 107 106  CO2 17*  --  18* 19*  GLUCOSE 136* 130* 99 103*  BUN 40* 34* 41* 45*  CREATININE  9.39* 10.10* 9.21* 9.95*  CALCIUM 7.4*  --  7.2* 7.3*  MG  --   --  1.8  --    Liver Function Tests: Recent Labs  Lab 01/08/21 1347  AST 8*  ALT 7  ALKPHOS 61  BILITOT 1.0  PROT 5.7*  ALBUMIN 2.6*   No results for input(s): LIPASE, AMYLASE in the last 168 hours. No results for input(s): AMMONIA in the last 168 hours. CBC: Recent Labs  Lab 01/08/21 1347 01/08/21 1353 01/09/21 0244  WBC 5.7  --  5.6  NEUTROABS 3.8  --   --   HGB 7.6* 8.8* 7.0*  HCT 24.9* 26.0* 23.6*  MCV 78.1*  --  79.2*  PLT 187  --  189   Cardiac Enzymes: No results for input(s): CKTOTAL, CKMB, CKMBINDEX, TROPONINI in the last 168 hours. BNP: BNP (last 3 results) Recent Labs    12/08/20 1004  BNP 482.0*    ProBNP (last 3 results) No results for input(s): PROBNP in the last 8760 hours.  CBG: Recent Labs  Lab 01/08/21 1343 01/09/21 0752 01/09/21 1202  GLUCAP 156* 94 81       Signed:  Irine Seal MD.  [facility] 01/09/2021, 5:17 PM

## 2021-01-09 NOTE — Consult Note (Signed)
[facility] Renal Consultation Note    Indication for Consultation:  Management of ESRD/hemodialysis; anemia, hypertension/volume and secondary hyperparathyroidism  HPI: [name]. is a 50 y.o. male  with a PMH significant for CAD, paroxysmal atrial fibrillation (on amiodarone and Eliquis), chronic diastolic CHF, HTN, HLD, GERD, medical noncompliance, and ESRD on HD TTS at [facility] (but he admits that he only goes on Tuesdays and Thursdays) who went to HD yesterday and developed lightheadedness after dialysis and had LOC after pulling into his driveway.  EMS called and found to be hypotensive with SBP in the 90's, CBG 78, and unresponsive.  He was given D10 en route and by the time he got to the ED he was awake and alert.  In the ED he was found to be in Atrial fib with RVR and HR in the 110's.  He was given IV cardizem without conversion to sinus rhythm.  He had a bedside cardioversion x 2 which were also unsuccessful.  He was started on cardizem drip and then converted to sinus rhythm.  We were consulted to provide dialysis during his hospitalization.  He tells me that he wants to go home and refuses to have dialysis tomorrow since he only goes twice a week.  Past Medical History:  Diagnosis Date   Anxiety    Cellulitis    CHF (congestive heart failure) (HCC)    Chronic diarrhea    Chronic kidney disease    Chronic pain of both ankles    Diabetes mellitus without complication (HCC)    Diabetes mellitus, type II (HCC)    Diabetic neuropathy (HCC)    Frequent falls    Gait instability    GERD (gastroesophageal reflux disease)    Heart palpitations    History of kidney stones    HLD (hyperlipidemia)    Hypertension    Insomnia    Nausea and vomiting in adult    recurrent   Past Surgical History:  Procedure Laterality Date   AMPUTATION Left 03/16/2016   Procedure: AMPUTATION DIGIT LEFT HALLUX;  Surgeon: Trula Slade, DPM;  Location: [place];   Service: Podiatry;  Laterality: Left;  can start around 3    ARTHROSCOPIC REPAIR ACL Left    AV FISTULA PLACEMENT Right 05/31/2019   Procedure: Brachiocephalic AV fistula creation;  Surgeon: Algernon Huxley, MD;  Location: [name] ORS;  Service: Vascular;  Laterality: Right;   COLONOSCOPY WITH PROPOFOL N/A 10/28/2015   Procedure: COLONOSCOPY WITH PROPOFOL;  Surgeon: Lollie Sails, MD;  Location: [facility] ENDOSCOPY;  Service: Endoscopy;  Laterality: N/A;   COLONOSCOPY WITH PROPOFOL N/A 10/29/2015   Procedure: COLONOSCOPY WITH PROPOFOL;  Surgeon: Lollie Sails, MD;  Location: [facility] ENDOSCOPY;  Service: Endoscopy;  Laterality: N/A;   CORONARY/GRAFT ACUTE MI REVASCULARIZATION N/A 02/26/2020   Procedure: Coronary/Graft Acute MI Revascularization;  Surgeon: Jettie Booze, MD;  Location: [place] [facility];  Service: Cardiovascular;  Laterality: N/A;   DIALYSIS/PERMA CATHETER INSERTION Right 04/26/2019   Perm Cath    DIALYSIS/PERMA CATHETER INSERTION N/A 04/26/2019   Procedure: DIALYSIS/PERMA CATHETER INSERTION;  Surgeon: Algernon Huxley, MD;  Location: [place] [facility];  Service: Cardiovascular;  Laterality: N/A;   DIALYSIS/PERMA CATHETER REMOVAL N/A 08/15/2019   Procedure: DIALYSIS/PERMA CATHETER REMOVAL;  Surgeon: Katha Cabal, MD;  Location: [place] [facility];  Service: Cardiovascular;  Laterality: N/A;   ESOPHAGOGASTRODUODENOSCOPY (EGD) WITH PROPOFOL N/A 12/27/2017   Procedure: ESOPHAGOGASTRODUODENOSCOPY (EGD) WITH PROPOFOL;  Surgeon: Alice Reichert, Benay Pike, MD;  Location: [name] ENDOSCOPY;  Service: Gastroenterology;  Laterality: N/A;   INTRAVASCULAR ULTRASOUND/IVUS N/A 02/26/2020   Procedure: Intravascular Ultrasound/IVUS;  Surgeon: Jettie Booze, MD;  Location: [employer] [facility];  Service: Cardiovascular;  Laterality: N/A;   LEFT HEART CATH AND CORONARY ANGIOGRAPHY N/A 02/26/2020   Procedure: LEFT HEART CATH AND CORONARY ANGIOGRAPHY;  Surgeon: Jettie Booze, MD;  Location: [place] [facility];  Service: Cardiovascular;  Laterality: N/A;   PROSTATE SURGERY  2016   TONSILECTOMY/ADENOIDECTOMY WITH MYRINGOTOMY     TONSILLECTOMY     UPPER EXTREMITY ANGIOGRAPHY Right 11/26/2020   Procedure: Upper Extremity Angiography;  Surgeon: Algernon Huxley, MD;  Location: [place] [facility];  Service: Cardiovascular;  Laterality: Right;   Family History:   Family History  Problem Relation Age of Onset   CAD Father    Stroke Father    Diabetes Mellitus II Mother    Kidney failure Mother    Schizophrenia Mother    Social History:  reports that he has never smoked. He has never used smokeless tobacco. He reports that he does not drink alcohol and does not use drugs. Allergies  Allergen Reactions   Levofloxacin Swelling    Of face Other reaction(s): swelling   Promethazine Diarrhea and Other (See Comments)    Other reaction(s): Other (See Comments), Other (See Comments) Other reaction(s): Muscle cramps Muscle cramps Cramping Other reaction(s): Muscle cramps Muscle cramps   Other     Other reaction(s): Unknown   Malt Other (See Comments)    Other reaction(s): cramping   Prior to Admission medications   Medication Sig Start Date End Date Taking? Authorizing Provider  amiodarone (PACERONE) 200 MG tablet 1 twice daily for 2 weeks, then 1 daily Patient taking differently: Take 200 mg by mouth 2 (two) times daily. 12/09/20  Yes Johnson, Clanford L, MD  apixaban (ELIQUIS) 5 MG TABS tablet Take 1 tablet (5 mg total) by mouth 2 (two) times daily. 11/27/20  Yes Sharen Hones, MD  atorvastatin (LIPITOR) 80 MG tablet TAKE 1 TABLET (80 MG TOTAL) BY MOUTH AT BEDTIME. 02/28/20 02/27/21 Yes Cheryln Manly, NP  calcium acetate (PHOSLO) 667 MG capsule Take 3 capsules (2,001 mg total) by mouth 3 (three) times daily with meals. 11/27/20  Yes Sharen Hones, MD  carvedilol (COREG) 25 MG tablet Take 1 tablet (25 mg total) by mouth 2 (two) times daily with a meal. 12/09/20  Yes Johnson, Clanford L, MD   clopidogrel (PLAVIX) 75 MG tablet Take 1 tablet (75 mg total) by mouth daily. 12/09/20  Yes Johnson, Clanford L, MD  FEROSUL 325 (65 Fe) MG tablet Take 325 mg by mouth daily. 09/08/20  Yes [provider]  fluticasone (FLONASE) 50 MCG/ACT nasal spray Place 2 sprays into both nostrils daily. 09/08/20  Yes [provider]  lidocaine-prilocaine (EMLA) cream Apply 1 application topically Every Tuesday,Thursday,and Saturday with dialysis.  08/15/19  Yes [provider]  nitroGLYCERIN (NITROSTAT) 0.4 MG SL tablet Place 1 tablet (0.4 mg total) under the tongue every 5 (five) minutes as needed for chest pain. 12/09/20 12/09/21 Yes Johnson, Clanford L, MD  oxyCODONE (OXY IR/ROXICODONE) 5 MG immediate release tablet Take 1 tablet by mouth every 4 (four) hours as needed. 12/04/20  Yes [provider]  prednisoLONE acetate (PRED FORTE) 1 % ophthalmic suspension Place 1 drop into the left eye every 4 (four) hours.   Yes [provider]  tamsulosin (FLOMAX) 0.4 MG CAPS capsule Take 0.4 mg by mouth daily  after breakfast.    Yes [provider]  azelastine (ASTELIN) 0.1 % nasal spray Place 2 sprays into both nostrils 2 (two) times daily. 09/08/20   [provider]   Current Facility-Administered Medications  Medication Dose Route Frequency Provider Last Rate Last Admin   acetaminophen (TYLENOL) tablet 650 mg  650 mg Oral Q6H PRN Regalado, Belkys A, MD       Or   acetaminophen (TYLENOL) suppository 650 mg  650 mg Rectal Q6H PRN Regalado, Belkys A, MD       apixaban (ELIQUIS) tablet 5 mg  5 mg Oral BID Regalado, Belkys A, MD   5 mg at 01/09/21 0956   atorvastatin (LIPITOR) tablet 80 mg  80 mg Oral QHS Regalado, Belkys A, MD   80 mg at 01/08/21 2112   calcium acetate (PHOSLO) capsule 2,001 mg  2,001 mg Oral TID WC Regalado, Belkys A, MD   2,001 mg at 01/09/21 1244   carvedilol (COREG) tablet 25 mg  25 mg Oral BID WC Regalado, Belkys A, MD   25 mg at  01/09/21 0804   clopidogrel (PLAVIX) tablet 75 mg  75 mg Oral Daily Regalado, Belkys A, MD   75 mg at 01/09/21 0957   ferrous sulfate tablet 325 mg  325 mg Oral Daily Regalado, Belkys A, MD   325 mg at 01/09/21 0956   hydrALAZINE (APRESOLINE) tablet 10 mg  10 mg Oral Q6H PRN Regalado, Belkys A, MD       insulin aspart (novoLOG) injection 0-6 Units  0-6 Units Subcutaneous TID WC Regalado, Belkys A, MD       nitroGLYCERIN (NITROSTAT) SL tablet 0.4 mg  0.4 mg Sublingual Q5 min PRN Regalado, Belkys A, MD       oxyCODONE (Oxy IR/ROXICODONE) immediate release tablet 5 mg  5 mg Oral Q4H PRN Regalado, Belkys A, MD   5 mg at 01/08/21 1813   tamsulosin (FLOMAX) capsule 0.4 mg  0.4 mg Oral QPC breakfast Regalado, Belkys A, MD   0.4 mg at 01/09/21 0804   Current Outpatient Medications  Medication Sig Dispense Refill   amiodarone (PACERONE) 200 MG tablet 1 twice daily for 2 weeks, then 1 daily (Patient taking differently: Take 200 mg by mouth 2 (two) times daily.) 42 tablet 0   apixaban (ELIQUIS) 5 MG TABS tablet Take 1 tablet (5 mg total) by mouth 2 (two) times daily. 60 tablet 0   atorvastatin (LIPITOR) 80 MG tablet TAKE 1 TABLET (80 MG TOTAL) BY MOUTH AT BEDTIME. 90 tablet 0   calcium acetate (PHOSLO) 667 MG capsule Take 3 capsules (2,001 mg total) by mouth 3 (three) times daily with meals. 100 capsule 0   carvedilol (COREG) 25 MG tablet Take 1 tablet (25 mg total) by mouth 2 (two) times daily with a meal. 60 tablet 1   clopidogrel (PLAVIX) 75 MG tablet Take 1 tablet (75 mg total) by mouth daily. 90 tablet 3   FEROSUL 325 (65 Fe) MG tablet Take 325 mg by mouth daily.     fluticasone (FLONASE) 50 MCG/ACT nasal spray Place 2 sprays into both nostrils daily.     lidocaine-prilocaine (EMLA) cream Apply 1 application topically Every Tuesday,Thursday,and Saturday with dialysis.      nitroGLYCERIN (NITROSTAT) 0.4 MG SL tablet Place 1 tablet (0.4 mg total) under the tongue every 5 (five) minutes as needed for  chest pain.     oxyCODONE (OXY IR/ROXICODONE) 5 MG immediate release tablet Take 1 tablet by mouth every 4 (  four) hours as needed.     prednisoLONE acetate (PRED FORTE) 1 % ophthalmic suspension Place 1 drop into the left eye every 4 (four) hours.     tamsulosin (FLOMAX) 0.4 MG CAPS capsule Take 0.4 mg by mouth daily after breakfast.      azelastine (ASTELIN) 0.1 % nasal spray Place 2 sprays into both nostrils 2 (two) times daily.     Labs: Basic Metabolic Panel: Recent Labs  Lab 01/08/21 1347 01/08/21 1353 01/08/21 1807 01/09/21 0244  NA 134* 138 136 135  K 2.5* 2.5* 3.4* 3.9  CL 102 105 107 106  CO2 17*  --  18* 19*  GLUCOSE 136* 130* 99 103*  BUN 40* 34* 41* 45*  CREATININE 9.39* 10.10* 9.21* 9.95*  CALCIUM 7.4*  --  7.2* 7.3*   Liver Function Tests: Recent Labs  Lab 01/08/21 1347  AST 8*  ALT 7  ALKPHOS 61  BILITOT 1.0  PROT 5.7*  ALBUMIN 2.6*   No results for input(s): LIPASE, AMYLASE in the last 168 hours. No results for input(s): AMMONIA in the last 168 hours. CBC: Recent Labs  Lab 01/08/21 1347 01/08/21 1353 01/09/21 0244  WBC 5.7  --  5.6  NEUTROABS 3.8  --   --   HGB 7.6* 8.8* 7.0*  HCT 24.9* 26.0* 23.6*  MCV 78.1*  --  79.2*  PLT 187  --  189   Cardiac Enzymes: No results for input(s): CKTOTAL, CKMB, CKMBINDEX, TROPONINI in the last 168 hours. CBG: Recent Labs  Lab 01/08/21 1343 01/09/21 0752 01/09/21 1202  GLUCAP 156* 94 81   Iron Studies: No results for input(s): IRON, TIBC, TRANSFERRIN, FERRITIN in the last 72 hours. Studies/Results: CT HEAD CODE STROKE WO CONTRAST  Result Date: 01/08/2021 CLINICAL DATA:  Code stroke.  Neuro deficit, acute, stroke suspected EXAM: CT HEAD WITHOUT CONTRAST TECHNIQUE: Contiguous axial images were obtained from the base of the skull through the vertex without intravenous contrast. COMPARISON:  December 08, 2020. FINDINGS: Brain: No evidence of acute infarction, hemorrhage, hydrocephalus, extra-axial collection  or mass lesion/mass effect. Vascular: No hyperdense vessel identified. Intracranial and extracranial calcific atherosclerosis. Skull: No acute fracture. Chronic thickening of the skin along the occiput/upper neck. Sinuses/Orbits: Moderate paranasal sinus mucosal thickening Other: No acute abnormality. ASPECTS [facility] Stroke Program Early CT Score) total score (0-10 with 10 being normal): 10. IMPRESSION: No evidence of acute abnormality on this mildly motion limited exam. ASPECTS is 10. Code stroke imaging results were communicated on 01/08/2021 at 1:57 pm to provider Dr. Leonel Ramsay via secure text paging. Electronically Signed   By: Margaretha Sheffield M.D.   On: 01/08/2021 13:59    ROS: Pertinent items are noted in HPI. Physical Exam: Vitals:   01/09/21 1230 01/09/21 1345 01/09/21 1415 01/09/21 1445  BP: 137/66 (!) 147/77 (!) 168/79 (!) 161/75  Pulse: 65 62 65 65  Resp: 10 14 11 11   Temp:      TempSrc:      SpO2: 98% 100% 92% 100%  Weight:      Height:          Weight change:   Intake/Output Summary (Last 24 hours) at 01/09/2021 1453 Last data filed at 01/09/2021 0018 Gross per 24 hour  Intake 75.86 ml  Output --  Net 75.86 ml   BP (!) 161/75   Pulse 65   Temp 98.7 F (37.1 C) (Oral)   Resp 11   Ht 6\' 3"  (1.905 m)   Wt 93.2 kg  SpO2 100%   BMI 25.68 kg/m  General appearance: alert, cooperative, and no distress Head: Normocephalic, without obvious abnormality, atraumatic Resp: clear to auscultation bilaterally Cardio: regular rate and rhythm, S1, S2 normal, no murmur, click, rub or gallop GI: soft, non-tender; bowel sounds normal; no masses,  no organomegaly Extremities: edema trace pretibial edema bilateral and RAVF +T/B Dialysis Access:  Dialysis Orders: [facility]: [facility]  on Monday Friday . EDW 88.5kg HD Bath 2K/2.5Ca  Time 3:45 Heparin 1800 units IVP bolus then 400 units/hr. Access RAVF BFR 400 DFR 500    Epogen 8400 units IVP every Mon and  Fri Assessment/Plan:  Atrial Fibrillation with RVR - has been a recurring issue and hospitalized earlier this month at [facility] on 10/17 and at [facility] on 11/[facility]/20.  He initially declined cardiac workup but was given a monitor that he has not yet mailed back.  Discussed that his atrial fibrillation needs to be evaluated as it is interfering with his ability to tolerate dialysis.  He may benefit from higher K bath as he was hypokalemic yesterday.  He also needs more frequent HD sessions to help with volume, anemia, and electrolytes.  ESRD -  only goes twice per week and s/o early due to cramping.  Discussed risks of noncompliance.   Will hold off on HD tomorrow as he does not want it.  Hypertension/volume  - stable   Anemia  - will dose with Sq Aranesp as he likely misses ESA doses  Metabolic bone disease -   resume home meds  Nutrition -  renal diet Medical Noncompliance - skips HD once a week and s/o early.  Discussed risks of shortening and skipping HD.  Donetta Potts, MD [place] Pager [phone] 01/09/2021, 2:53 PM

## 2021-01-09 NOTE — ED Notes (Signed)
Pt given two [place] sandwich bags and water.

## 2021-01-09 NOTE — ED Notes (Signed)
Pt refused CBG monitoring. 

## 2021-01-09 NOTE — ED Notes (Signed)
Pt refusing to be stuck for blood sugar or for blood work. Pt states I can go home now because what yall aren't going to do is keep sticking me. This will be the last time. This RN told the pt she would let the doctor know he wanted to leave. This RN will continue to monitor.

## 2021-01-09 NOTE — ED Notes (Signed)
DC instructions reviewed with pt. Pt verbalized understanding.  Pt DC 

## 2021-01-12 ENCOUNTER — Telehealth (HOSPITAL_COMMUNITY): Payer: Self-pay

## 2021-01-12 NOTE — Telephone Encounter (Signed)
Reached out to patient to schedule follow-up appointment. No answer left voicemail.

## 2021-01-22 ENCOUNTER — Telehealth (INDEPENDENT_AMBULATORY_CARE_PROVIDER_SITE_OTHER): Payer: Self-pay

## 2021-01-22 NOTE — Telephone Encounter (Signed)
I attempted to contact the patient to scheduled him for a right UE angio and a message was left for a return call.

## 2021-01-23 NOTE — Telephone Encounter (Signed)
Patient called back and is scheduled with Dr. Lucky Cowboy on 01/29/21 with a 7:30 am arrival time to the [facility]. Pre-procedure instructions will be mailed on Monday. Waiting on prior authorization to go through.

## 2021-01-27 NOTE — Telephone Encounter (Signed)
Patient has been called to come in for an appt to be seen as he has not been seen in a year. Patient is scheduled for a right UE angio that will have to be rescheduled pending authorization and an appt in our office. A message was left for a return call.,

## 2021-01-28 ENCOUNTER — Telehealth (INDEPENDENT_AMBULATORY_CARE_PROVIDER_SITE_OTHER): Payer: Self-pay

## 2021-01-28 ENCOUNTER — Emergency Department: Payer: Medicare Other

## 2021-01-28 ENCOUNTER — Other Ambulatory Visit: Payer: Self-pay

## 2021-01-28 ENCOUNTER — Encounter: Payer: Self-pay | Admitting: Emergency Medicine

## 2021-01-28 DIAGNOSIS — T8741 Infection of amputation stump, right upper extremity: Principal | ICD-10-CM | POA: Diagnosis present

## 2021-01-28 DIAGNOSIS — M25571 Pain in right ankle and joints of right foot: Secondary | ICD-10-CM | POA: Diagnosis present

## 2021-01-28 DIAGNOSIS — Z20822 Contact with and (suspected) exposure to covid-19: Secondary | ICD-10-CM | POA: Diagnosis present

## 2021-01-28 DIAGNOSIS — Z881 Allergy status to other antibiotic agents status: Secondary | ICD-10-CM

## 2021-01-28 DIAGNOSIS — E785 Hyperlipidemia, unspecified: Secondary | ICD-10-CM | POA: Diagnosis present

## 2021-01-28 DIAGNOSIS — I480 Paroxysmal atrial fibrillation: Secondary | ICD-10-CM | POA: Diagnosis present

## 2021-01-28 DIAGNOSIS — T148XXA Other injury of unspecified body region, initial encounter: Secondary | ICD-10-CM | POA: Diagnosis not present

## 2021-01-28 DIAGNOSIS — Z8249 Family history of ischemic heart disease and other diseases of the circulatory system: Secondary | ICD-10-CM

## 2021-01-28 DIAGNOSIS — Z818 Family history of other mental and behavioral disorders: Secondary | ICD-10-CM

## 2021-01-28 DIAGNOSIS — G8929 Other chronic pain: Secondary | ICD-10-CM | POA: Diagnosis present

## 2021-01-28 DIAGNOSIS — Z992 Dependence on renal dialysis: Secondary | ICD-10-CM

## 2021-01-28 DIAGNOSIS — Z79899 Other long term (current) drug therapy: Secondary | ICD-10-CM

## 2021-01-28 DIAGNOSIS — T8781 Dehiscence of amputation stump: Secondary | ICD-10-CM | POA: Diagnosis present

## 2021-01-28 DIAGNOSIS — K529 Noninfective gastroenteritis and colitis, unspecified: Secondary | ICD-10-CM | POA: Diagnosis present

## 2021-01-28 DIAGNOSIS — I132 Hypertensive heart and chronic kidney disease with heart failure and with stage 5 chronic kidney disease, or end stage renal disease: Secondary | ICD-10-CM | POA: Diagnosis present

## 2021-01-28 DIAGNOSIS — I2510 Atherosclerotic heart disease of native coronary artery without angina pectoris: Secondary | ICD-10-CM | POA: Diagnosis present

## 2021-01-28 DIAGNOSIS — I5032 Chronic diastolic (congestive) heart failure: Secondary | ICD-10-CM | POA: Diagnosis present

## 2021-01-28 DIAGNOSIS — N186 End stage renal disease: Secondary | ICD-10-CM | POA: Diagnosis present

## 2021-01-28 DIAGNOSIS — Z833 Family history of diabetes mellitus: Secondary | ICD-10-CM

## 2021-01-28 DIAGNOSIS — E1122 Type 2 diabetes mellitus with diabetic chronic kidney disease: Secondary | ICD-10-CM | POA: Diagnosis present

## 2021-01-28 DIAGNOSIS — Z7902 Long term (current) use of antithrombotics/antiplatelets: Secondary | ICD-10-CM

## 2021-01-28 DIAGNOSIS — K219 Gastro-esophageal reflux disease without esophagitis: Secondary | ICD-10-CM | POA: Diagnosis present

## 2021-01-28 DIAGNOSIS — Z823 Family history of stroke: Secondary | ICD-10-CM

## 2021-01-28 DIAGNOSIS — M25572 Pain in left ankle and joints of left foot: Secondary | ICD-10-CM | POA: Diagnosis present

## 2021-01-28 DIAGNOSIS — I252 Old myocardial infarction: Secondary | ICD-10-CM

## 2021-01-28 DIAGNOSIS — D631 Anemia in chronic kidney disease: Secondary | ICD-10-CM | POA: Diagnosis present

## 2021-01-28 DIAGNOSIS — N2581 Secondary hyperparathyroidism of renal origin: Secondary | ICD-10-CM | POA: Diagnosis present

## 2021-01-28 DIAGNOSIS — E1140 Type 2 diabetes mellitus with diabetic neuropathy, unspecified: Secondary | ICD-10-CM | POA: Diagnosis present

## 2021-01-28 DIAGNOSIS — Y835 Amputation of limb(s) as the cause of abnormal reaction of the patient, or of later complication, without mention of misadventure at the time of the procedure: Secondary | ICD-10-CM | POA: Diagnosis present

## 2021-01-28 DIAGNOSIS — Z888 Allergy status to other drugs, medicaments and biological substances status: Secondary | ICD-10-CM

## 2021-01-28 DIAGNOSIS — Z841 Family history of disorders of kidney and ureter: Secondary | ICD-10-CM

## 2021-01-28 DIAGNOSIS — T8789 Other complications of amputation stump: Secondary | ICD-10-CM | POA: Diagnosis present

## 2021-01-28 DIAGNOSIS — Z7901 Long term (current) use of anticoagulants: Secondary | ICD-10-CM

## 2021-01-28 LAB — CBC WITH DIFFERENTIAL/PLATELET
Abs Immature Granulocytes: 0.06 10*3/uL (ref 0.00–0.07)
Basophils Absolute: 0 10*3/uL (ref 0.0–0.1)
Basophils Relative: 0 %
Eosinophils Absolute: 0.2 10*3/uL (ref 0.0–0.5)
Eosinophils Relative: 1 %
HCT: 23.7 % — ABNORMAL LOW (ref 39.0–52.0)
Hemoglobin: 7.1 g/dL — ABNORMAL LOW (ref 13.0–17.0)
Immature Granulocytes: 1 %
Lymphocytes Relative: 10 %
Lymphs Abs: 1.1 10*3/uL (ref 0.7–4.0)
MCH: 23.9 pg — ABNORMAL LOW (ref 26.0–34.0)
MCHC: 30 g/dL (ref 30.0–36.0)
MCV: 79.8 fL — ABNORMAL LOW (ref 80.0–100.0)
Monocytes Absolute: 1.1 10*3/uL — ABNORMAL HIGH (ref 0.1–1.0)
Monocytes Relative: 10 %
Neutro Abs: 8.1 10*3/uL — ABNORMAL HIGH (ref 1.7–7.7)
Neutrophils Relative %: 78 %
Platelets: 248 10*3/uL (ref 150–400)
RBC: 2.97 MIL/uL — ABNORMAL LOW (ref 4.22–5.81)
RDW: 20.1 % — ABNORMAL HIGH (ref 11.5–15.5)
WBC: 10.5 10*3/uL (ref 4.0–10.5)
nRBC: 1.2 % — ABNORMAL HIGH (ref 0.0–0.2)

## 2021-01-28 LAB — COMPREHENSIVE METABOLIC PANEL
ALT: 11 U/L (ref 0–44)
AST: 10 U/L — ABNORMAL LOW (ref 15–41)
Albumin: 2.9 g/dL — ABNORMAL LOW (ref 3.5–5.0)
Alkaline Phosphatase: 64 U/L (ref 38–126)
Anion gap: 13 (ref 5–15)
BUN: 66 mg/dL — ABNORMAL HIGH (ref 6–20)
CO2: 16 mmol/L — ABNORMAL LOW (ref 22–32)
Calcium: 7.8 mg/dL — ABNORMAL LOW (ref 8.9–10.3)
Chloride: 109 mmol/L (ref 98–111)
Creatinine, Ser: 12.06 mg/dL — ABNORMAL HIGH (ref 0.61–1.24)
GFR, Estimated: 5 mL/min — ABNORMAL LOW (ref >60)
Glucose, Bld: 160 mg/dL — ABNORMAL HIGH (ref 70–99)
Potassium: 3.7 mmol/L (ref 3.5–5.1)
Sodium: 138 mmol/L (ref 135–145)
Total Bilirubin: 0.6 mg/dL (ref 0.3–1.2)
Total Protein: 6.3 g/dL — ABNORMAL LOW (ref 6.5–8.1)

## 2021-01-28 LAB — LACTIC ACID, PLASMA: Lactic Acid, Venous: 1.5 mmol/L (ref 0.5–1.9)

## 2021-01-28 NOTE — Telephone Encounter (Signed)
Patient called back complaining of extreme hand pain and wanting something done. I spoke with Eulogio Ditch NP and the patient was advised to go to the ED for evaluation.

## 2021-01-28 NOTE — Telephone Encounter (Signed)
I attempted to contact the patient on 01/27/21 to get him scheduled for an appt in our office to be seen. I called this morning and spoke with the patient and he is coming in to be seen on 01/29/21 at 11:45 am.

## 2021-01-28 NOTE — ED Triage Notes (Signed)
Pt comes into the ED via POV c/o infection in his right hand.  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  Per the patient he has not completed a post op appt as of yet.  Pt also was originally scheduled to have a right UE angio tomorrow, but it was rescheduled due to prior-authorization problems. Pt presents with a bag taped over his right hand, and foul odor is present.  Pt also presents with purulent drainage on the banadeaes.

## 2021-01-29 ENCOUNTER — Inpatient Hospital Stay
Admission: EM | Admit: 2021-01-29 | Discharge: 2021-01-30 | DRG: 564 | Disposition: A | Discharge status: Home / Self Care | Payer: Medicare Other | Attending: Internal Medicine | Admitting: Internal Medicine

## 2021-01-29 ENCOUNTER — Ambulatory Visit (INDEPENDENT_AMBULATORY_CARE_PROVIDER_SITE_OTHER): Payer: Medicare Other | Admitting: Nurse Practitioner

## 2021-01-29 ENCOUNTER — Encounter: Admission: RE | Payer: Self-pay | Source: Ambulatory Visit

## 2021-01-29 ENCOUNTER — Ambulatory Visit: Admission: RE | Admit: 2021-01-29 | Payer: Medicare Other | Source: Ambulatory Visit | Admitting: Vascular Surgery

## 2021-01-29 DIAGNOSIS — I480 Paroxysmal atrial fibrillation: Secondary | ICD-10-CM | POA: Diagnosis present

## 2021-01-29 DIAGNOSIS — I5032 Chronic diastolic (congestive) heart failure: Secondary | ICD-10-CM | POA: Diagnosis present

## 2021-01-29 DIAGNOSIS — I132 Hypertensive heart and chronic kidney disease with heart failure and with stage 5 chronic kidney disease, or end stage renal disease: Secondary | ICD-10-CM | POA: Diagnosis present

## 2021-01-29 DIAGNOSIS — Z992 Dependence on renal dialysis: Secondary | ICD-10-CM | POA: Diagnosis not present

## 2021-01-29 DIAGNOSIS — Y835 Amputation of limb(s) as the cause of abnormal reaction of the patient, or of later complication, without mention of misadventure at the time of the procedure: Secondary | ICD-10-CM | POA: Diagnosis present

## 2021-01-29 DIAGNOSIS — Z818 Family history of other mental and behavioral disorders: Secondary | ICD-10-CM | POA: Diagnosis not present

## 2021-01-29 DIAGNOSIS — E785 Hyperlipidemia, unspecified: Secondary | ICD-10-CM | POA: Diagnosis present

## 2021-01-29 DIAGNOSIS — D631 Anemia in chronic kidney disease: Secondary | ICD-10-CM | POA: Diagnosis present

## 2021-01-29 DIAGNOSIS — I2510 Atherosclerotic heart disease of native coronary artery without angina pectoris: Secondary | ICD-10-CM | POA: Diagnosis present

## 2021-01-29 DIAGNOSIS — T8781 Dehiscence of amputation stump: Secondary | ICD-10-CM

## 2021-01-29 DIAGNOSIS — T8131XA Disruption of external operation (surgical) wound, not elsewhere classified, initial encounter: Secondary | ICD-10-CM | POA: Diagnosis not present

## 2021-01-29 DIAGNOSIS — E1140 Type 2 diabetes mellitus with diabetic neuropathy, unspecified: Secondary | ICD-10-CM | POA: Diagnosis present

## 2021-01-29 DIAGNOSIS — K529 Noninfective gastroenteritis and colitis, unspecified: Secondary | ICD-10-CM | POA: Diagnosis present

## 2021-01-29 DIAGNOSIS — Z794 Long term (current) use of insulin: Secondary | ICD-10-CM | POA: Diagnosis not present

## 2021-01-29 DIAGNOSIS — T148XXA Other injury of unspecified body region, initial encounter: Secondary | ICD-10-CM | POA: Diagnosis present

## 2021-01-29 DIAGNOSIS — Z823 Family history of stroke: Secondary | ICD-10-CM | POA: Diagnosis not present

## 2021-01-29 DIAGNOSIS — I252 Old myocardial infarction: Secondary | ICD-10-CM | POA: Diagnosis not present

## 2021-01-29 DIAGNOSIS — G8929 Other chronic pain: Secondary | ICD-10-CM | POA: Diagnosis present

## 2021-01-29 DIAGNOSIS — Z20822 Contact with and (suspected) exposure to covid-19: Secondary | ICD-10-CM | POA: Diagnosis present

## 2021-01-29 DIAGNOSIS — T8789 Other complications of amputation stump: Secondary | ICD-10-CM | POA: Diagnosis present

## 2021-01-29 DIAGNOSIS — Z8249 Family history of ischemic heart disease and other diseases of the circulatory system: Secondary | ICD-10-CM | POA: Diagnosis not present

## 2021-01-29 DIAGNOSIS — K219 Gastro-esophageal reflux disease without esophagitis: Secondary | ICD-10-CM | POA: Diagnosis present

## 2021-01-29 DIAGNOSIS — L089 Local infection of the skin and subcutaneous tissue, unspecified: Secondary | ICD-10-CM | POA: Diagnosis present

## 2021-01-29 DIAGNOSIS — E119 Type 2 diabetes mellitus without complications: Secondary | ICD-10-CM | POA: Diagnosis not present

## 2021-01-29 DIAGNOSIS — Z833 Family history of diabetes mellitus: Secondary | ICD-10-CM | POA: Diagnosis not present

## 2021-01-29 DIAGNOSIS — E1122 Type 2 diabetes mellitus with diabetic chronic kidney disease: Secondary | ICD-10-CM

## 2021-01-29 DIAGNOSIS — Z841 Family history of disorders of kidney and ureter: Secondary | ICD-10-CM | POA: Diagnosis not present

## 2021-01-29 DIAGNOSIS — T8189XA Other complications of procedures, not elsewhere classified, initial encounter: Secondary | ICD-10-CM

## 2021-01-29 DIAGNOSIS — N186 End stage renal disease: Secondary | ICD-10-CM

## 2021-01-29 DIAGNOSIS — T8741 Infection of amputation stump, right upper extremity: Secondary | ICD-10-CM | POA: Diagnosis present

## 2021-01-29 DIAGNOSIS — N2581 Secondary hyperparathyroidism of renal origin: Secondary | ICD-10-CM | POA: Diagnosis present

## 2021-01-29 DIAGNOSIS — M869 Osteomyelitis, unspecified: Secondary | ICD-10-CM | POA: Diagnosis present

## 2021-01-29 LAB — CBC
HCT: 25.3 % — ABNORMAL LOW (ref 39.0–52.0)
Hemoglobin: 7.7 g/dL — ABNORMAL LOW (ref 13.0–17.0)
MCH: 23.9 pg — ABNORMAL LOW (ref 26.0–34.0)
MCHC: 30.4 g/dL (ref 30.0–36.0)
MCV: 78.6 fL — ABNORMAL LOW (ref 80.0–100.0)
Platelets: 240 10*3/uL (ref 150–400)
RBC: 3.22 MIL/uL — ABNORMAL LOW (ref 4.22–5.81)
RDW: 19.8 % — ABNORMAL HIGH (ref 11.5–15.5)
WBC: 10.3 10*3/uL (ref 4.0–10.5)
nRBC: 0.2 % (ref 0.0–0.2)

## 2021-01-29 LAB — CBG MONITORING, ED: Glucose-Capillary: 79 mg/dL (ref 70–99)

## 2021-01-29 LAB — HEPATITIS B SURFACE ANTIGEN: Hepatitis B Surface Ag: NONREACTIVE

## 2021-01-29 LAB — RESP PANEL BY RT-PCR (FLU A&B, COVID) ARPGX2
Influenza A by PCR: NEGATIVE
Influenza B by PCR: NEGATIVE
SARS Coronavirus 2 by RT PCR: NEGATIVE

## 2021-01-29 LAB — PHOSPHORUS: Phosphorus: 3.8 mg/dL (ref 2.5–4.6)

## 2021-01-29 LAB — HEPATITIS B SURFACE ANTIBODY,QUALITATIVE: Hep B S Ab: REACTIVE — AB

## 2021-01-29 LAB — TROPONIN I (HIGH SENSITIVITY)
Troponin I (High Sensitivity): 80 ng/L — ABNORMAL HIGH (ref <18)
Troponin I (High Sensitivity): 53 ng/L — ABNORMAL HIGH (ref <18)

## 2021-01-29 LAB — CREATININE, SERUM
Creatinine, Ser: 13.07 mg/dL — ABNORMAL HIGH (ref 0.61–1.24)
GFR, Estimated: 4 mL/min — ABNORMAL LOW (ref >60)

## 2021-01-29 LAB — GLUCOSE, CAPILLARY: Glucose-Capillary: 171 mg/dL — ABNORMAL HIGH (ref 70–99)

## 2021-01-29 SURGERY — UPPER EXTREMITY ANGIOGRAPHY
Anesthesia: Moderate Sedation | Laterality: Right

## 2021-01-29 MED ORDER — CLOPIDOGREL BISULFATE 75 MG PO TABS
75.0000 mg | ORAL_TABLET | Freq: Every day | ORAL | Status: DC
  Administered 2021-01-29 – 2021-01-30 (×2): 75 mg via ORAL
  Filled 2021-01-29 (×2): qty 1

## 2021-01-29 MED ORDER — ACETAMINOPHEN 325 MG PO TABS: 650.0000 mg | ORAL_TABLET | Freq: Four times a day (QID) | ORAL | Status: DC | PRN

## 2021-01-29 MED ORDER — CALCIUM ACETATE (PHOS BINDER) 667 MG PO CAPS
2001.0000 mg | ORAL_CAPSULE | Freq: Three times a day (TID) | ORAL | Status: DC
  Administered 2021-01-30 (×2): 2001 mg via ORAL
  Filled 2021-01-29 (×4): qty 3

## 2021-01-29 MED ORDER — INSULIN ASPART 100 UNIT/ML IJ SOLN
0.0000 [IU] | INTRAMUSCULAR | Status: DC
Start: 2021-01-29 — End: 2021-01-29

## 2021-01-29 MED ORDER — CHLORHEXIDINE GLUCONATE CLOTH 2 % EX PADS
6.0000 | MEDICATED_PAD | Freq: Every day | CUTANEOUS | Status: DC
  Administered 2021-01-30: 6 via TOPICAL
  Filled 2021-01-29 (×2): qty 6

## 2021-01-29 MED ORDER — PIPERACILLIN-TAZOBACTAM 3.375 G IVPB 30 MIN
3.3750 g | Freq: Once | INTRAVENOUS | Status: AC
  Administered 2021-01-29: 3.375 g via INTRAVENOUS
  Filled 2021-01-29: qty 50

## 2021-01-29 MED ORDER — TAMSULOSIN HCL 0.4 MG PO CAPS
0.4000 mg | ORAL_CAPSULE | Freq: Every day | ORAL | Status: DC
  Administered 2021-01-30: 09:00:00 0.4 mg via ORAL
  Filled 2021-01-29: qty 1

## 2021-01-29 MED ORDER — PIPERACILLIN-TAZOBACTAM IN DEX 2-0.25 GM/50ML IV SOLN
2.2500 g | Freq: Three times a day (TID) | INTRAVENOUS | Status: DC
  Administered 2021-01-29 – 2021-01-30 (×4): 2.25 g via INTRAVENOUS
  Filled 2021-01-29 (×8): qty 50

## 2021-01-29 MED ORDER — VANCOMYCIN HCL IN DEXTROSE 1-5 GM/200ML-% IV SOLN
1000.0000 mg | INTRAVENOUS | Status: DC
  Administered 2021-01-29: 1000 mg via INTRAVENOUS
  Filled 2021-01-29: qty 200

## 2021-01-29 MED ORDER — VANCOMYCIN HCL 2000 MG/400ML IV SOLN
2000.0000 mg | Freq: Once | INTRAVENOUS | Status: AC
  Administered 2021-01-29: 2000 mg via INTRAVENOUS
  Filled 2021-01-29: qty 400

## 2021-01-29 MED ORDER — CARVEDILOL 25 MG PO TABS
25.0000 mg | ORAL_TABLET | Freq: Two times a day (BID) | ORAL | Status: DC
  Administered 2021-01-29 – 2021-01-30 (×3): 25 mg via ORAL
  Filled 2021-01-29 (×3): qty 1

## 2021-01-29 MED ORDER — HEPARIN SODIUM (PORCINE) 5000 UNIT/ML IJ SOLN
5000.0000 [IU] | Freq: Three times a day (TID) | INTRAMUSCULAR | Status: DC
  Administered 2021-01-29 – 2021-01-30 (×5): 5000 [IU] via SUBCUTANEOUS
  Filled 2021-01-29 (×5): qty 1

## 2021-01-29 MED ORDER — AZELASTINE HCL 0.1 % NA SOLN
2.0000 | Freq: Two times a day (BID) | NASAL | Status: DC
  Filled 2021-01-29: qty 30

## 2021-01-29 MED ORDER — HYDROMORPHONE HCL 1 MG/ML IJ SOLN
0.5000 mg | INTRAMUSCULAR | Status: DC | PRN
  Administered 2021-01-29 (×2): 0.5 mg via INTRAVENOUS
  Filled 2021-01-29 (×2): qty 1

## 2021-01-29 MED ORDER — VANCOMYCIN HCL IN DEXTROSE 1-5 GM/200ML-% IV SOLN
1000.0000 mg | INTRAVENOUS | Status: DC
Start: 2021-01-29 — End: 2021-01-29

## 2021-01-29 MED ORDER — FLUTICASONE PROPIONATE 50 MCG/ACT NA SUSP
2.0000 | Freq: Every day | NASAL | Status: DC
  Filled 2021-01-29: qty 16

## 2021-01-29 MED ORDER — LOSARTAN POTASSIUM 25 MG PO TABS
25.0000 mg | ORAL_TABLET | Freq: Every day | ORAL | Status: DC
  Administered 2021-01-30: 09:00:00 25 mg via ORAL
  Filled 2021-01-29 (×2): qty 1

## 2021-01-29 MED ORDER — PIPERACILLIN-TAZOBACTAM IN DEX 2-0.25 GM/50ML IV SOLN
2.2500 g | Freq: Three times a day (TID) | INTRAVENOUS | Status: DC
  Filled 2021-01-29 (×2): qty 50

## 2021-01-29 MED ORDER — FERROUS SULFATE 325 (65 FE) MG PO TABS
325.0000 mg | ORAL_TABLET | Freq: Every day | ORAL | Status: DC
  Administered 2021-01-29 – 2021-01-30 (×2): 325 mg via ORAL
  Filled 2021-01-29 (×2): qty 1

## 2021-01-29 MED ORDER — MORPHINE SULFATE (PF) 4 MG/ML IV SOLN
4.0000 mg | Freq: Once | INTRAVENOUS | Status: AC
  Administered 2021-01-29: 4 mg via INTRAVENOUS
  Filled 2021-01-29: qty 1

## 2021-01-29 MED ORDER — ACETAMINOPHEN 650 MG RE SUPP: 650.0000 mg | Freq: Four times a day (QID) | RECTAL | Status: DC | PRN

## 2021-01-29 MED ORDER — LIDOCAINE-PRILOCAINE 2.5-2.5 % EX CREA
1.0000 "application " | TOPICAL_CREAM | CUTANEOUS | Status: DC
  Filled 2021-01-29: qty 5

## 2021-01-29 MED ORDER — PREDNISOLONE ACETATE 1 % OP SUSP
1.0000 [drp] | OPHTHALMIC | Status: DC
  Administered 2021-01-29 – 2021-01-30 (×5): 1 [drp] via OPHTHALMIC
  Filled 2021-01-29: qty 1

## 2021-01-29 MED ORDER — NITROGLYCERIN 0.4 MG SL SUBL: 0.4000 mg | SUBLINGUAL_TABLET | SUBLINGUAL | Status: DC | PRN

## 2021-01-29 MED ORDER — ATORVASTATIN CALCIUM 20 MG PO TABS
80.0000 mg | ORAL_TABLET | Freq: Every day | ORAL | Status: DC
  Administered 2021-01-29: 21:00:00 80 mg via ORAL
  Filled 2021-01-29: qty 4

## 2021-01-29 NOTE — Progress Notes (Signed)
[facility]  ROUNDING NOTE   Subjective:   [name]. is a 50 year old male with a past medical history significant for diabetes, hypertension, diastolic CHF, CAD with STEMI January 2022, and end stage renal disease on dialysis. Patient presents to emergency room with complains of pain and drainage from finger. Patient has been admitted for Infected wound [T14.8XXA, L08.9]  Patient is known to our clinic and receives outpatient treatment from [facility] on a TTS schedule. He is supervised by Dr Juleen China. He states he's been having drainage from his finger for 2-3 days. It has become more painful with foul smelling drainage. He recently had finger amputated in Oct 2022 by Dr Peggye Ley. He denies fever and chills. Denies nausea and vomiting.  We have been consulted to manage dialysis needs during this admission.    Objective:  Vital signs in last 24 hours:  Temp:  [98.1 F (36.7 C)-98.6 F (37 C)] 98.1 F (36.7 C) (12/08 0927) Pulse Rate:  [60-68] 68 (12/08 1230) Resp:  [9-18] 11 (12/08 1230) BP: (97-167)/(59-92) 149/89 (12/08 1230) SpO2:  [96 %-100 %] 98 % (12/08 0945) Weight:  [93.2 kg] 93.2 kg (12/08 0856)  Weight change:  Filed Weights   01/28/21 1753 01/29/21 0856  Weight: 93.2 kg 93.2 kg    Intake/Output: I/O last 3 completed shifts: In: 448.9 [IV Piggyback:448.9] Out: -    Intake/Output this shift:  No intake/output data recorded.  Physical Exam: General: NAD  Head: Normocephalic, atraumatic. Moist oral mucosal membranes  Eyes: Anicteric  Lungs:  Clear to auscultation, normal effort  Heart: Regular rate and rhythm  Abdomen:  Soft, nontender  Extremities:  1+ peripheral edema.  Neurologic: Nonfocal, moving all four extremities  Skin:  Right 4th finger wound   Access: Lt AVF    Basic Metabolic Panel: Recent Labs  Lab 01/28/21 1831 01/29/21 0735  NA 138  --   K 3.7  --   CL 109  --   CO2 16*  --   GLUCOSE 160*  --   BUN 66*  --    CREATININE 12.06* 13.07*  CALCIUM 7.8*  --     Liver Function Tests: Recent Labs  Lab 01/28/21 1831  AST 10*  ALT 11  ALKPHOS 64  BILITOT 0.6  PROT 6.3*  ALBUMIN 2.9*   No results for input(s): LIPASE, AMYLASE in the last 168 hours. No results for input(s): AMMONIA in the last 168 hours.  CBC: Recent Labs  Lab 01/28/21 1831 01/29/21 0735  WBC 10.5 10.3  NEUTROABS 8.1*  --   HGB 7.1* 7.7*  HCT 23.7* 25.3*  MCV 79.8* 78.6*  PLT 248 240    Cardiac Enzymes: No results for input(s): CKTOTAL, CKMB, CKMBINDEX, TROPONINI in the last 168 hours.  BNP: Invalid input(s): POCBNP  CBG: Recent Labs  Lab 01/29/21 4818  HUDJSH 70    Microbiology: Results for orders placed or performed during the hospital encounter of 01/29/21  Body fluid culture w Gram Stain     Status: None (Preliminary result)   Collection Time: 01/29/21  1:06 AM   Specimen: Hand; Body Fluid  Result Value Ref Range Status   Specimen Description   Final    HAND LEFT Performed at [facility], [address] [place], [place] [address]    Special Requests   Final    NONE Performed at [facility], [address] [place],  [address]    Gram Stain   Final  ABUNDANT WBC PRESENT, PREDOMINANTLY PMN MODERATE GRAM POSITIVE COCCI MODERATE GRAM NEGATIVE RODS RARE GRAM POSITIVE RODS Performed at [facility], [place] [address] [place], [place] [address]    Culture PENDING  Incomplete   Report Status PENDING  Incomplete  Resp Panel by RT-PCR (Flu A&B, Covid) Hand, Left     Status: None   Collection Time: 01/29/21  1:06 AM   Specimen: Hand, Left; Nasopharyngeal(NP) swabs in vial transport medium  Result Value Ref Range Status   SARS Coronavirus 2 by RT PCR NEGATIVE NEGATIVE Final    Comment: (NOTE) SARS-CoV-2 target nucleic acids are NOT DETECTED.  The SARS-CoV-2 RNA is generally detectable in upper respiratory specimens during the acute phase of infection. The  lowest concentration of SARS-CoV-2 viral copies this assay can detect is 138 copies/mL. A negative result does not preclude SARS-Cov-2 infection and should not be used as the sole basis for treatment or other patient management decisions. A negative result may occur with  improper specimen collection/handling, submission of specimen other than nasopharyngeal swab, presence of viral mutation(s) within the areas targeted by this assay, and inadequate number of viral copies(<138 copies/mL). A negative result must be combined with clinical observations, patient history, and epidemiological information. The expected result is Negative.  Fact Sheet for Patients:  EntrepreneurPulse.com.au  Fact Sheet for Healthcare Providers:  IncredibleEmployment.be  This test is no t yet approved or cleared by the [place] FDA and  has been authorized for detection and/or diagnosis of SARS-CoV-2 by FDA under an Emergency Use Authorization (EUA). This EUA will remain  in effect (meaning this test can be used) for the duration of the COVID-19 declaration under Section 564(b)(1) of the Act, 21 U.S.C.section 360bbb-3(b)(1), unless the authorization is terminated  or revoked sooner.       Influenza A by PCR NEGATIVE NEGATIVE Final   Influenza B by PCR NEGATIVE NEGATIVE Final    Comment: (NOTE) The Xpert Xpress SARS-CoV-2/FLU/RSV plus assay is intended as an aid in the diagnosis of influenza from Nasopharyngeal swab specimens and should not be used as a sole basis for treatment. Nasal washings and aspirates are unacceptable for Xpert Xpress SARS-CoV-2/FLU/RSV testing.  Fact Sheet for Patients: EntrepreneurPulse.com.au  Fact Sheet for Healthcare Providers: IncredibleEmployment.be  This test is not yet approved or cleared by the [place] FDA and has been authorized for detection and/or diagnosis of SARS-CoV-2 by FDA under  an Emergency Use Authorization (EUA). This EUA will remain in effect (meaning this test can be used) for the duration of the COVID-19 declaration under Section 564(b)(1) of the Act, 21 U.S.C. section 360bbb-3(b)(1), unless the authorization is terminated or revoked.  Performed at [facility], [address] [place], [place] [address]     Coagulation Studies: No results for input(s): LABPROT, INR in the last 72 hours.  Urinalysis: No results for input(s): COLORURINE, LABSPEC, PHURINE, GLUCOSEU, HGBUR, BILIRUBINUR, KETONESUR, PROTEINUR, UROBILINOGEN, NITRITE, LEUKOCYTESUR in the last 72 hours.  Invalid input(s): APPERANCEUR    Imaging: DG Hand Complete Right  Result Date: 01/28/2021 CLINICAL DATA:  Pt comes into the ED via POV c/o infection in his right hand. 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 Sharp margins. No cortical erosion or destruction. There is no evidence of fracture or dislocation. There is no  evidence of severe arthropathy or aggressive appearing focal bone abnormality. Soft tissues are unremarkable. Vascular calcifications. 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 Electronically Signed   By: Iven Finn M.D.   On: 01/28/2021 18:55     Medications:    piperacillin-tazobactam (ZOSYN)  IV Stopped (01/29/21 0858)   vancomycin      carvedilol  25 mg Oral BID WC   Chlorhexidine Gluconate Cloth  6 each Topical Q0600   heparin  5,000 Units Subcutaneous Q8H   acetaminophen **OR** acetaminophen, HYDROmorphone (DILAUDID) injection  Assessment/ Plan:  [name]. is a 50 y.o.  male with a past medical history significant for diabetes, hypertension, diastolic CHF, CAD with STEMI January 2022, and  end stage renal disease on dialysis. Patiet presents to emergency room with complains of pain and drainage from finger. Patient has been admitted for Infected wound [T14.8XXA, L08.9]  [facility] TTS [facility] Left AVF 93kg  End stage renal disease on dialysis. Receiving dialysis today. Next treatment scheduled for Saturday  2. Anemia of chronic kidney disease.  Lab Results  Component Value Date   HGB 7.7 (L) 01/29/2021   Hgb below target. Will order EPO with treatment  3. Secondary Hyperparathyroidism:  Lab Results  Component Value Date   CALCIUM 7.8 (L) 01/28/2021   CAION 0.94 (L) 01/08/2021   PHOS 8.7 (H) 02/27/2020    Correct calcium 8.7 with elevated phosphorus, continue calcium acetate with meals  4. Hypertension with chronic kidney disease. Home regimen includes losartan, carvedilol and amiodarone. Currently receiving carvedilol.   5. Wound dehiscence with infection of fourth finger amputation. Surgery consulted. Pain management and cultures ordered.    LOS: 0   12/8/20221:08 PM

## 2021-01-29 NOTE — ED Notes (Signed)
Pt refused CBG check. Pt states "I don't have diabetes, I don't have to check my sugar."

## 2021-01-29 NOTE — ED Notes (Signed)
Pt concerned about not getting his amiodarone medication, MD states it was held on admission and reasoning is in H&P.

## 2021-01-29 NOTE — ED Notes (Signed)
Dr. Sabra Heck here to see pt, pt currently at dialysis.

## 2021-01-29 NOTE — ED Notes (Signed)
Lab at bedside. Pt refused blood work at this time.

## 2021-01-29 NOTE — ED Notes (Signed)
Report given to Dialysis RN.

## 2021-01-29 NOTE — Progress Notes (Signed)
Patient receives 3 hours of HD, tolerates well, meets targeted UF, with 1.5-liter fluid removal. RUA AVF functions without difficulty. Patient returns to ED awaiting bed placement, report given to primary nurse.

## 2021-01-29 NOTE — Progress Notes (Signed)
Pharmacy Antibiotic Note  [name]. is a 50 y.o. male admitted on 01/29/2021 with Wound infection. Pt on HD PTA.  Pt says he's on HD "just T-Th" at home but could be different schedule.  Pharmacy has been consulted for Vanc, Zosyn  dosing.  Plan: Zosyn 3.375 gm IV X 1 given in ED on 12/8 @ 0133. Zosyn 2.25 gm IV Q8H ordered to start on 12/8 @ 1000.   Vancomycin 2 gm IV X 1 loading dose given on 12/8 @ 0203. Vancomycin 1 gm IV Q T-Th-Sat with HD ordered.  -  Will need to verify HD schedule   Height: 6\' 3"  (190.5 cm) Weight: 93.2 kg (205 lb 7.5 oz) IBW/kg (Calculated) : 84.5  Temp (24hrs), Avg:98.5 F (36.9 C), Min:98.4 F (36.9 C), Max:98.6 F (37 C)  Recent Labs  Lab 01/28/21 1831  WBC 10.5  CREATININE 12.06*  LATICACIDVEN 1.5    Estimated Creatinine Clearance: 8.6 mL/min (A) (by C-G formula based on SCr of 12.06 mg/dL (H)).    Allergies  Allergen Reactions   Levofloxacin Swelling    Of face Other reaction(s): swelling   Promethazine Diarrhea and Other (See Comments)    Other reaction(s): Other (See Comments), Other (See Comments) Other reaction(s): Muscle cramps Muscle cramps Cramping Other reaction(s): Muscle cramps Muscle cramps   Other     Other reaction(s): Unknown   Malt Other (See Comments)    Other reaction(s): cramping    Antimicrobials this admission:   >>    >>   Dose adjustments this admission:   Microbiology results:  BCx:   UCx:    Sputum:    MRSA PCR:   Thank you for allowing pharmacy to be a part of this patient's care.  Katrin Grabel D 01/29/2021 3:00 AM

## 2021-01-29 NOTE — H&P (Signed)
History and Physical    [name]. HLK:[id number] DOB: 1970-09-15 DOA: 01/29/2021  PCP: [name], MD   Patient coming from: home  I have personally briefly reviewed patient's relevant medical records in [place]  Chief Complaint: oozing from wound  HPI: [name]. is a 50 y.o. male with medical history significant for DM, diet controlled, HTN, ESRD on HD twice weekly per patient choice, diastolic CHF, CAD s/p STEMI 02/2020, A. fib on Eliquis, with recurrent RVR requiring electrical cardioversion, anemia of CKD, recurrent postdialysis syncopal episodes, prolonged QTC, PVD s/p right finger amputation, presents to the ED with a 2-day history of purulent drainage from the wound associated with sharp throbbing pain.  He denies fever or chills.  ED course: BP 97/59 with otherwise normal vitals Blood work hemoglobin 7.1 (baseline 7-8) Blood work otherwise at baseline  EKG: Normal sinus rhythm  X-ray fourth finger: No erosion or destruction soft testes unremarkable  Patient started on Zosyn and vancomycin.  Hospitalist consulted for admission.   Review of Systems: As per HPI otherwise all other systems on review of systems negative.   Assessment/Plan    Wound dehiscence of  amputation fourth finger with infection -IV Zosyn and vancomycin - Dr. Sabra Heck was consulted from the ER and will see patient in the a.m. - Pain management - Follow cultures    Anemia due to chronic kidney disease, on chronic dialysis ([place]) - Hemoglobin at baseline at 7.1 - Continue to monitor    ESRD on hemodialysis [facility]) - Nephrology consult for continuation of dialysis    AF (paroxysmal atrial fibrillation) (HCC) - Currently rate controlled - Hold Eliquis in case of procedure in the a.m. - Continue amiodarone and carvedilol - Has required cardioversion in the past    Diabetes mellitus, type II ([place]) -Sliding scale insulin coverage    Chronic diastolic heart failure  (HCC) - Currently euvolemic  History of recurrent syncope postdialysis - No acute issues  Prolonged QTC - Holding amiodarone for tonight  CAD - No complaints of chest pain and EKG nonacute     DVT prophylaxis: Heparin for DVT prophylaxis Code Status: full code  Family Communication:  none  Disposition Plan: Back to previous home environment Consults called: nephrology and ortho Status:At the time of admission, it appears that the appropriate admission status for this patient is INPATIENT. This is judged to be reasonable and necessary in order to provide the required intensity of service to ensure the patient's safety given the presenting symptoms, physical exam findings, and initial radiographic and laboratory data in the context of their  Comorbid conditions.   Patient requires inpatient status due to high intensity of service, high risk for further deterioration and high frequency of surveillance required.   I certify that at the point of admission it is my clinical judgment that the patient will require inpatient hospital care spanning beyond 2 midnights     Physical Exam: Vitals:   01/28/21 1753 01/28/21 1818 01/28/21 1939  BP:  (!) 97/59 (!) 125/59  Pulse:  60 60  Resp:  17 18  Temp:  98.6 F (37 C) 98.4 F (36.9 C)  TempSrc:  Oral Oral  SpO2:  99% 100%  Weight: 93.2 kg    Height: 6\' 3"  (1.905 m)     Constitutional: Alert, oriented x 3 . Not in any apparent distress HEENT:      Head: Normocephalic and atraumatic.         Eyes: PERLA, EOMI, Conjunctivae  are normal. Sclera is non-icteric.       Mouth/Throat: Mucous membranes are moist.       Neck: Supple with no signs of meningismus. Cardiovascular: Regular rate and rhythm. No murmurs, gallops, or rubs. 2+ symmetrical distal pulses are present . No JVD. No  LE edema Respiratory: Respiratory effort normal .Lungs sounds clear bilaterally. No wheezes, crackles, or rhonchi.  Gastrointestinal: Soft, non tender, non  distended. Positive bowel sounds.  Genitourinary: No CVA tenderness. Musculoskeletal: Nontender with normal range of motion in all extremities.  Amputated right fourth finger with purulent foul-smelling oozing. No cyanosis, or erythema of extremities. Neurologic:  Face is symmetric. Moving all extremities. No gross focal neurologic deficits . Skin: Skin is warm, dry.  No rash or ulcers Psychiatric: Mood and affect are appropriate     Past Medical History:  Diagnosis Date   Anxiety    Cellulitis    CHF (congestive heart failure) (HCC)    Chronic diarrhea    Chronic kidney disease    Chronic pain of both ankles    Diabetes mellitus without complication (HCC)    Diabetes mellitus, type II (HCC)    Diabetic neuropathy (HCC)    Frequent falls    Gait instability    GERD (gastroesophageal reflux disease)    Heart palpitations    History of kidney stones    HLD (hyperlipidemia)    Hypertension    Insomnia    Nausea and vomiting in adult    recurrent    Past Surgical History:  Procedure Laterality Date   AMPUTATION Left 03/16/2016   Procedure: AMPUTATION DIGIT LEFT HALLUX;  Surgeon: Trula Slade, DPM;  Location: [facility];  Service: Podiatry;  Laterality: Left;  can start around 5    ARTHROSCOPIC REPAIR ACL Left    AV FISTULA PLACEMENT Right 05/31/2019   Procedure: Brachiocephalic AV fistula creation;  Surgeon: Algernon Huxley, MD;  Location: [name];  Service: Vascular;  Laterality: Right;   COLONOSCOPY WITH PROPOFOL N/A 10/28/2015   Procedure: COLONOSCOPY WITH PROPOFOL;  Surgeon: Lollie Sails, MD;  Location: [facility] ENDOSCOPY;  Service: Endoscopy;  Laterality: N/A;   COLONOSCOPY WITH PROPOFOL N/A 10/29/2015   Procedure: COLONOSCOPY WITH PROPOFOL;  Surgeon: Lollie Sails, MD;  Location[facility] ENDOSCOPY;  Service: Endoscopy;  Laterality: N/A;   CORONARY/GRAFT ACUTE MI REVASCULARIZATION N/A 02/26/2020   Procedure: Coronary/Graft Acute MI Revascularization;  Surgeon: Jettie Booze, MD;  Location: [place] CV LAB;  Service: Cardiovascular;  Laterality: N/A;   DIALYSIS/PERMA CATHETER INSERTION Right 04/26/2019   Perm Cath    DIALYSIS/PERMA CATHETER INSERTION N/A 04/26/2019   Procedure: DIALYSIS/PERMA CATHETER INSERTION;  Surgeon: Algernon Huxley, MD;  Location: [place] [facility] LAB;  Service: Cardiovascular;  Laterality: N/A;   DIALYSIS/PERMA CATHETER REMOVAL N/A 08/15/2019   Procedure: DIALYSIS/PERMA CATHETER REMOVAL;  Surgeon: Katha Cabal, MD;  Location: [place] [facility];  Service: Cardiovascular;  Laterality: N/A;   ESOPHAGOGASTRODUODENOSCOPY (EGD) WITH PROPOFOL N/A 12/27/2017   Procedure: ESOPHAGOGASTRODUODENOSCOPY (EGD) WITH PROPOFOL;  Surgeon: Tol[place], Benay Pike, MD;  Location: [name] ENDOSCOPY;  Service: Gastroenterology;  Laterality: N/A;   INTRAVASCULAR ULTRASOUND/IVUS N/A 02/26/2020   Procedure: Intravascular Ultrasound/IVUS;  Surgeon: Jettie Booze, MD;  Location: [place] [facility];  Service: Cardiovascular;  Laterality: N/A;   LEFT HEART CATH AND CORONARY ANGIOGRAPHY N/A 02/26/2020   Procedure: LEFT HEART CATH AND CORONARY ANGIOGRAPHY;  Surgeon: Jettie Booze, MD;  Location: [place] [facility];  Service: Cardiovascular;  Laterality: N/A;  PROSTATE SURGERY  2016   TONSILECTOMY/ADENOIDECTOMY WITH MYRINGOTOMY     TONSILLECTOMY     UPPER EXTREMITY ANGIOGRAPHY Right 11/26/2020   Procedure: Upper Extremity Angiography;  Surgeon: Algernon Huxley, MD;  Location: [place] [facility];  Service: Cardiovascular;  Laterality: Right;     reports that he has never smoked. He has never used smokeless tobacco. He reports that he does not drink alcohol and does not use drugs.  Allergies  Allergen Reactions   Levofloxacin Swelling    Of face Other reaction(s): swelling   Promethazine Diarrhea and Other (See Comments)    Other reaction(s): Other (See Comments), Other (See Comments) Other reaction(s): Muscle cramps Muscle cramps Cramping Other reaction(s):  Muscle cramps Muscle cramps   Other     Other reaction(s): Unknown   Malt Other (See Comments)    Other reaction(s): cramping    Family History  Problem Relation Age of Onset   CAD Father    Stroke Father    Diabetes Mellitus II Mother    Kidney failure Mother    Schizophrenia Mother       Prior to Admission medications   Medication Sig Start Date End Date Taking? Authorizing Provider  amiodarone (PACERONE) 200 MG tablet Take 1 tablet (200 mg total) by mouth daily. 1 twice daily for 2 weeks, then 1 daily 01/09/21  Yes Eugenie Filler, MD  apixaban (ELIQUIS) 5 MG TABS tablet Take 1 tablet (5 mg total) by mouth 2 (two) times daily. 11/27/20  Yes Sharen Hones, MD  atorvastatin (LIPITOR) 80 MG tablet TAKE 1 TABLET (80 MG TOTAL) BY MOUTH AT BEDTIME. 02/28/20 02/27/21 Yes Reino Bellis B, NP  azelastine (ASTELIN) 0.1 % nasal spray Place 2 sprays into both nostrils 2 (two) times daily. 09/08/20  Yes [provider]  calcium acetate (PHOSLO) 667 MG capsule Take 3 capsules (2,001 mg total) by mouth 3 (three) times daily with meals. 11/27/20  Yes Sharen Hones, MD  carvedilol (COREG) 25 MG tablet Take 1 tablet (25 mg total) by mouth 2 (two) times daily with a meal. 12/09/20  Yes Johnson, Clanford L, MD  clopidogrel (PLAVIX) 75 MG tablet Take 1 tablet (75 mg total) by mouth daily. 12/09/20  Yes Johnson, Clanford L, MD  FEROSUL 325 (65 Fe) MG tablet Take 325 mg by mouth daily. 09/08/20  Yes [provider]  fluticasone (FLONASE) 50 MCG/ACT nasal spray Place 2 sprays into both nostrils daily. 09/08/20  Yes [provider]  HYDROcodone-acetaminophen (NORCO/VICODIN) 5-325 MG tablet Take 2 tablets by mouth 2 (two) times daily. 01/23/21  Yes [provider]  lidocaine-prilocaine (EMLA) cream Apply 1 application topically Every Tuesday,Thursday,and Saturday with dialysis.  08/15/19  Yes [provider]  losartan (COZAAR) 25 MG tablet Take 25 mg by mouth daily.  01/21/21  Yes [provider]  prednisoLONE acetate (PRED FORTE) 1 % ophthalmic suspension Place 1 drop into the left eye every 4 (four) hours.   Yes [provider]  tamsulosin (FLOMAX) 0.4 MG CAPS capsule Take 0.4 mg by mouth daily after breakfast.    Yes [provider]  nitroGLYCERIN (NITROSTAT) 0.4 MG SL tablet Place 1 tablet (0.4 mg total) under the tongue every 5 (five) minutes as needed for chest pain. 12/09/20 12/09/21  Murlean Iba, MD      Labs on Admission: I have personally reviewed following labs and imaging studies  CBC: Recent Labs  Lab 01/28/21 1831  WBC 10.5  NEUTROABS 8.1*  HGB 7.1*  HCT 23.7*  MCV 79.8*  PLT 275   Basic Metabolic Panel: Recent Labs  Lab 01/28/21 1831  NA 138  K 3.7  CL 109  CO2 16*  GLUCOSE 160*  BUN 66*  CREATININE 12.06*  CALCIUM 7.8*   GFR: Estimated Creatinine Clearance: 8.6 mL/min (A) (by C-G formula based on SCr of 12.06 mg/dL (H)). Liver Function Tests: Recent Labs  Lab 01/28/21 1831  AST 10*  ALT 11  ALKPHOS 64  BILITOT 0.6  PROT 6.3*  ALBUMIN 2.9*   No results for input(s): LIPASE, AMYLASE in the last 168 hours. No results for input(s): AMMONIA in the last 168 hours. Coagulation Profile: No results for input(s): INR, PROTIME in the last 168 hours. Cardiac Enzymes: No results for input(s): CKTOTAL, CKMB, CKMBINDEX, TROPONINI in the last 168 hours. BNP (last 3 results) No results for input(s): PROBNP in the last 8760 hours. HbA1C: No results for input(s): HGBA1C in the last 72 hours. CBG: No results for input(s): GLUCAP in the last 168 hours. Lipid Profile: No results for input(s): CHOL, HDL, LDLCALC, TRIG, CHOLHDL, LDLDIRECT in the last 72 hours. Thyroid Function Tests: No results for input(s): TSH, T4TOTAL, FREET4, T3FREE, THYROIDAB in the last 72 hours. Anemia Panel: No results for input(s): VITAMINB12, FOLATE, FERRITIN, TIBC, IRON, RETICCTPCT in the last 72 hours. Urine  analysis:    Component Value Date/Time   COLORURINE YELLOW (A) 11/07/2017 1855   APPEARANCEUR CLOUDY (A) 11/07/2017 1855   LABSPEC 1.016 11/07/2017 1855   PHURINE 5.0 11/07/2017 1855   GLUCOSEU >=500 (A) 11/07/2017 1855   HGBUR MODERATE (A) 11/07/2017 1855   BILIRUBINUR NEGATIVE 11/07/2017 1855   KETONESUR NEGATIVE 11/07/2017 1855   PROTEINUR >=300 (A) 11/07/2017 1855   NITRITE NEGATIVE 11/07/2017 1855   LEUKOCYTESUR NEGATIVE 11/07/2017 1855    Radiological Exams on Admission: DG Hand Complete Right  Result Date: 01/28/2021 CLINICAL DATA:  Pt comes into the ED via POV c/o infection in his right hand. 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 Sharp margins. No cortical erosion or destruction. There is no evidence of fracture or dislocation. There is no evidence of severe arthropathy or aggressive appearing focal bone abnormality. Soft tissues are unremarkable. Vascular calcifications. 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 Electronically Signed   By: Iven Finn M.D.   On: 01/28/2021 18:55       Athena Masse MD [facility]   01/29/2021, 1:54 AM

## 2021-01-29 NOTE — Progress Notes (Signed)
PHARMACY -  BRIEF ANTIBIOTIC NOTE   Pharmacy has received consult(s) for Vanc from an ED provider.  The patient's profile has been reviewed for ht/wt/allergies/indication/available labs.    One time order(s) placed for Vancomycin 2 gm IV X 1   Further antibiotics/pharmacy consults should be ordered by admitting physician if indicated.                       Thank you, Tasneem Cormier D 01/29/2021  12:58 AM

## 2021-01-29 NOTE — Consult Note (Signed)
ORTHOPAEDIC CONSULTATION  REQUESTING PHYSICIAN: Richarda Osmond, MD  Chief Complaint: Pain in right hand secondary to infection  HPI: [name]. is a 50 y.o. male who complains of pain in right hand secondary infection.  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 Peggye Ley or hand specialist.  He has been treated with oral antibiotics.  The wound is not healed.  He was seen in [facility] office 01/27/2021 and was stable.  He was scheduled for an angiogram on the right hand and arm to evaluate circulation to see what level of amputation would be required.  He presented to the emergency room last night with increased pain.  His white count is normal at 10,100.  His temperature is normal.  His hemoglobin stable at 7.1.  Platelets are normal.  He is currently in the dialysis unit getting dialyzed.  Past Medical History:  Diagnosis Date   Anxiety    Cellulitis    CHF (congestive heart failure) (HCC)    Chronic diarrhea    Chronic kidney disease    Chronic pain of both ankles    Diabetes mellitus without complication (HCC)    Diabetes mellitus, type II (HCC)    Diabetic neuropathy (HCC)    Frequent falls    Gait instability    GERD (gastroesophageal reflux disease)    Heart palpitations    History of kidney stones    HLD (hyperlipidemia)    Hypertension    Insomnia    Nausea and vomiting in adult    recurrent   Past Surgical History:  Procedure Laterality Date   AMPUTATION Left 03/16/2016   Procedure: AMPUTATION DIGIT LEFT HALLUX;  Surgeon: Trula Slade, DPM;  Location: [place];  Service: Podiatry;  Laterality: Left;  can start around 5    ARTHROSCOPIC REPAIR ACL Left    AV FISTULA PLACEMENT Right 05/31/2019   Procedure: Brachiocephalic AV fistula creation;  Surgeon: Algernon Huxley, MD;  Location: [name] ORS;  Service: Vascular;  Laterality: Right;   COLONOSCOPY WITH PROPOFOL  N/A 10/28/2015   Procedure: COLONOSCOPY WITH PROPOFOL;  Surgeon: Lollie Sails, MD;  Location: [facility] ENDOSCOPY;  Service: Endoscopy;  Laterality: N/A;   COLONOSCOPY WITH PROPOFOL N/A 10/29/2015   Procedure: COLONOSCOPY WITH PROPOFOL;  Surgeon: Lollie Sails, MD;  Location: [facility] ENDOSCOPY;  Service: Endoscopy;  Laterality: N/A;   CORONARY/GRAFT ACUTE MI REVASCULARIZATION N/A 02/26/2020   Procedure: Coronary/Graft Acute MI Revascularization;  Surgeon: Jettie Booze, MD;  Location: [place] CV LAB;  Service: Cardiovascular;  Laterality: N/A;   DIALYSIS/PERMA CATHETER INSERTION Right 04/26/2019   Perm Cath    DIALYSIS/PERMA CATHETER INSERTION N/A 04/26/2019   Procedure: DIALYSIS/PERMA CATHETER INSERTION;  Surgeon: Algernon Huxley, MD;  Location: [place] CV LAB;  Service: Cardiovascular;  Laterality: N/A;   DIALYSIS/PERMA CATHETER REMOVAL N/A 08/15/2019   Procedure: DIALYSIS/PERMA CATHETER REMOVAL;  Surgeon: Katha Cabal, MD;  Location: [place] CV LAB;  Service: Cardiovascular;  Laterality: N/A;   ESOPHAGOGASTRODUODENOSCOPY (EGD) WITH PROPOFOL N/A 12/27/2017   Procedure: ESOPHAGOGASTRODUODENOSCOPY (EGD) WITH PROPOFOL;  Surgeon: Toledo, Benay Pike, MD;  Location: [name] ENDOSCOPY;  Service: Gastroenterology;  Laterality: N/A;   INTRAVASCULAR ULTRASOUND/IVUS N/A 02/26/2020   Procedure: Intravascular Ultrasound/IVUS;  Surgeon: Jettie Booze, MD;  Location: [place] CV LAB;  Service: Cardiovascular;  Laterality: N/A;   LEFT HEART CATH AND CORONARY ANGIOGRAPHY N/A 02/26/2020   Procedure:  LEFT HEART CATH AND CORONARY ANGIOGRAPHY;  Surgeon: Jettie Booze, MD;  Location: [place] CV LAB;  Service: Cardiovascular;  Laterality: N/A;   PROSTATE SURGERY  2016   TONSILECTOMY/ADENOIDECTOMY WITH MYRINGOTOMY     TONSILLECTOMY     UPPER EXTREMITY ANGIOGRAPHY Right 11/26/2020   Procedure: Upper Extremity Angiography;  Surgeon: Algernon Huxley, MD;  Location: [place] CV LAB;   Service: Cardiovascular;  Laterality: Right;   Social History   Socioeconomic History   Marital status: Married    Spouse name: [name]    Number of children: 2   Years of education: Not on file   Highest education level: High school graduate  Occupational History   Occupation: Disability    Comment: not employed  Tobacco Use   Smoking status: Never   Smokeless tobacco: Never  Vaping Use   Vaping Use: Never used  Substance and Sexual Activity   Alcohol use: No   Drug use: No   Sexual activity: Yes  Other Topics Concern   Not on file  Social History Narrative   Oldest son killed in car crash June 2020.    Social Determinants of Health   Financial Resource Strain: Not on file  Food Insecurity: Not on file  Transportation Needs: No Transportation Needs   Lack of Transportation (Medical): No   Lack of Transportation (Non-Medical): No  Physical Activity: Not on file  Stress: Not on file  Social Connections: Not on file   Family History  Problem Relation Age of Onset   CAD Father    Stroke Father    Diabetes Mellitus II Mother    Kidney failure Mother    Schizophrenia Mother    Allergies  Allergen Reactions   Levofloxacin Swelling    Of face Other reaction(s): swelling   Promethazine Diarrhea and Other (See Comments)    Other reaction(s): Other (See Comments), Other (See Comments) Other reaction(s): Muscle cramps Muscle cramps Cramping Other reaction(s): Muscle cramps Muscle cramps   Other     Other reaction(s): Unknown   Malt Other (See Comments)    Other reaction(s): cramping   Prior to Admission medications   Medication Sig Start Date End Date Taking? Authorizing Provider  amiodarone (PACERONE) 200 MG tablet Take 1 tablet (200 mg total) by mouth daily. 1 twice daily for 2 weeks, then 1 daily 01/09/21  Yes Eugenie Filler, MD  apixaban (ELIQUIS) 5 MG TABS tablet Take 1 tablet (5 mg total) by mouth 2 (two) times daily. 11/27/20  Yes Sharen Hones, MD   atorvastatin (LIPITOR) 80 MG tablet TAKE 1 TABLET (80 MG TOTAL) BY MOUTH AT BEDTIME. 02/28/20 02/27/21 Yes Reino Bellis B, NP  azelastine (ASTELIN) 0.1 % nasal spray Place 2 sprays into both nostrils 2 (two) times daily. 09/08/20  Yes [provider]  calcium acetate (PHOSLO) 667 MG capsule Take 3 capsules (2,001 mg total) by mouth 3 (three) times daily with meals. 11/27/20  Yes Sharen Hones, MD  carvedilol (COREG) 25 MG tablet Take 1 tablet (25 mg total) by mouth 2 (two) times daily with a meal. 12/09/20  Yes Johnson, Clanford L, MD  clopidogrel (PLAVIX) 75 MG tablet Take 1 tablet (75 mg total) by mouth daily. 12/09/20  Yes Johnson, Clanford L, MD  FEROSUL 325 (65 Fe) MG tablet Take 325 mg by mouth daily. 09/08/20  Yes [provider]  fluticasone (FLONASE) 50 MCG/ACT nasal spray Place 2 sprays into both nostrils daily. 09/08/20  Yes [provider]  HYDROcodone-acetaminophen (  NORCO/VICODIN) 5-325 MG tablet Take 2 tablets by mouth 2 (two) times daily. 01/23/21  Yes [provider]  lidocaine-prilocaine (EMLA) cream Apply 1 application topically Every Tuesday,Thursday,and Saturday with dialysis.  08/15/19  Yes [provider]  losartan (COZAAR) 25 MG tablet Take 25 mg by mouth daily. 01/21/21  Yes [provider]  prednisoLONE acetate (PRED FORTE) 1 % ophthalmic suspension Place 1 drop into the left eye every 4 (four) hours.   Yes [provider]  tamsulosin (FLOMAX) 0.4 MG CAPS capsule Take 0.4 mg by mouth daily after breakfast.    Yes [provider]  nitroGLYCERIN (NITROSTAT) 0.4 MG SL tablet Place 1 tablet (0.4 mg total) under the tongue every 5 (five) minutes as needed for chest pain. 12/09/20 12/09/21  Murlean Iba, MD   DG Hand Complete Right  Result Date: 01/28/2021 CLINICAL DATA:  Pt comes into the ED via POV c/o infection in his right hand. 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 right hand  pain s/p amputation, continued EXAM: RIGHT HAND - COMPLETE 3+ VIEW COMPARISON:  X-ray right hand 11/24/2020 FINDINGS: Status post amputation of the fourth digit proximal phalanx body. Sharp margins. No cortical erosion or destruction. There is no evidence of fracture or dislocation. There is no evidence of severe arthropathy or aggressive appearing focal bone abnormality. Soft tissues are unremarkable. Vascular calcifications. 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 Electronically Signed   By: Iven Finn M.D.   On: 01/28/2021 18:55    Positive ROS: All other systems have been reviewed and were otherwise negative with the exception of those mentioned in the HPI and as above.  Physical Exam: General: Alert, no acute distress Cardiovascular: No pedal edema Respiratory: No cyanosis, no use of accessory musculature GI: No organomegaly, abdomen is soft and non-tender Skin: No lesions in the area of chief complaint Neurologic: Sensation intact distally Psychiatric: Patient is competent for consent with normal mood and affect Lymphatic: No axillary or cervical lymphadenopathy  MUSCULOSKELETAL: The right hand shows an amputation through the midportion of the right ring finger proximal phalanx.  The there is no skin coverage distally.  The wound is dry with no drainage.  There is no significant swelling or redness in the hand.  There is some tenderness over the flexor tendon sheath near the MP level.  Remainder the hand is unremarkable.  The wrist is unremarkable.  Assessment: Nonhealing amputation wound right hand.  The patient does not appear to be septic.  Plan: The patient is already scheduled for revision surgery next Friday and [place] [facility] by Dr. Peggye Ley.  He is to be seen in our office next week for preop evaluation as well.  IV antibiotics for 24 to 48 hours may improve his pain level and he may then be discharged on oral  antibiotics again.  He has been on doxycycline prior to admission here.  He is supposed to get an angiogram and Dr. Corene Cornea dew has been contacted about this as well.  This will be used to decide on the level of revision amputation that needs to be done.  Clean dry sterile dressing should be kept on the wound.  There is no evidence of any need for surgery at the current time.    Park Breed, MD [phone]   01/29/2021 1:01 PM

## 2021-01-29 NOTE — Progress Notes (Signed)
  INTERVAL PROGRESS NOTE    [name].- 50 y.o. male  LOS: 0 __________________________________________________________________  SUBJECTIVE: Admitted 01/29/2021 with cc of R finger/hand pain Chief Complaint  Patient presents with   Wound Infection   Since admission, patient has continued to have pain of R 4th PIP and metacarpals.   OBJECTIVE: Blood pressure (!) 167/82, pulse 64, temperature 98.1 F (36.7 C), temperature source Oral, resp. rate 13, height 6\' 3"  (1.905 m), weight 93.2 kg, SpO2 98 %.  General: NAD, pleasant, able to participate in exam Respiratory: CTAB, normal effort Extremities: no edema. WWP. Skin: please see clinical image for further detail. Mild erythema ascending from wound up to wrist.  Neuro: alert and oriented, no focal deficits Psych: mildly agitated     ASSESSMENT/PLAN:  I have reviewed the full H&P by Dr. Damita Dunnings, and I agree with the assessment and plan as outlined therein. In addition: Analgesia PRN Dr. Sabra Heck to see today HD today   Principal Problem:   Infected wound Active Problems:   Diabetes mellitus, type II ([place])   Chronic diastolic heart failure (HCC)   Finger infection, fourth left   AF (paroxysmal atrial fibrillation) (HCC)   Anemia due to chronic kidney disease, on chronic dialysis ([place])   Wound dehiscence, surgical, initial encounter   ESRD on hemodialysis ([place])      Richarda Osmond, DO [facility] 01/29/2021, 11:03 AM    [email or web address] Available by Epic secure chat 7AM-7PM. If 7PM-7AM, please contact night-coverage   No Charge

## 2021-01-29 NOTE — Consult Note (Signed)
[facility] Vascular Consult Note  MRN : [id number]  [name]. is a 50 y.o. (October 20, 1970) male who presents with chief complaint of  Chief Complaint  Patient presents with   Wound Infection   History of Present Illness:  [name]. is a 50 y.o. male with medical history significant for DM, diet controlled, HTN, ESRD on HD twice weekly per patient choice, diastolic CHF, CAD s/p STEMI 02/2020, A. fib on Eliquis, with recurrent RVR requiring electrical cardioversion, anemia of CKD, recurrent postdialysis syncopal episodes, prolonged QTC, PVD s/p right finger amputation (Dr. Peggye Ley), presents to the ED with a 2-day history of purulent drainage from the wound associated with sharp throbbing pain.  He denies fever or chills.   ED course: BP 97/59 with otherwise normal vitals Blood work hemoglobin 7.1 (baseline 7-8) Blood work otherwise at baseline   EKG: Normal sinus rhythm   X-ray fourth finger: No erosion or destruction    Patient started on Zosyn and vancomycin.    Vascular surgery was consulted by Dr. Juleen China in the setting of multiple risk factors for atherosclerotic disease, nonhealing amputation to the right fourth finger, need for revision surgery next week.  Current Facility-Administered Medications  Medication Dose Route Frequency Provider Last Rate Last Admin   acetaminophen (TYLENOL) tablet 650 mg  650 mg Oral Q6H PRN Athena Masse, MD       Or   acetaminophen (TYLENOL) suppository 650 mg  650 mg Rectal Q6H PRN Athena Masse, MD       carvedilol (COREG) tablet 25 mg  25 mg Oral BID WC Athena Masse, MD   25 mg at 01/29/21 0855   Chlorhexidine Gluconate Cloth 2 % PADS 6 each  6 each Topical Q0600 Kolluru, Sarath, MD       heparin injection 5,000 Units  5,000 Units Subcutaneous Q8H Judd Gaudier V, MD   5,000 Units at 01/29/21 1541   HYDROmorphone (DILAUDID) injection 0.5 mg  0.5 mg Intravenous Q4H PRN Richarda Osmond, MD   0.5  mg at 01/29/21 1401   piperacillin-tazobactam (ZOSYN) IVPB 2.25 g  2.25 g Intravenous Q8H Doristine Mango L, MD 100 mL/hr at 01/29/21 1405 2.25 g at 01/29/21 1405   vancomycin (VANCOCIN) IVPB 1000 mg/200 mL premix  1,000 mg Intravenous Once per day on Tue Thu Dolan, Carissa E, RPH 200 mL/hr at 01/29/21 1405 1,000 mg at 01/29/21 1405   Current Outpatient Medications  Medication Sig Dispense Refill   amiodarone (PACERONE) 200 MG tablet Take 1 tablet (200 mg total) by mouth daily. 1 twice daily for 2 weeks, then 1 daily 30 tablet 1   apixaban (ELIQUIS) 5 MG TABS tablet Take 1 tablet (5 mg total) by mouth 2 (two) times daily. 60 tablet 0   atorvastatin (LIPITOR) 80 MG tablet TAKE 1 TABLET (80 MG TOTAL) BY MOUTH AT BEDTIME. 90 tablet 0   azelastine (ASTELIN) 0.1 % nasal spray Place 2 sprays into both nostrils 2 (two) times daily.     calcium acetate (PHOSLO) 667 MG capsule Take 3 capsules (2,001 mg total) by mouth 3 (three) times daily with meals. 100 capsule 0   carvedilol (COREG) 25 MG tablet Take 1 tablet (25 mg total) by mouth 2 (two) times daily with a meal. 60 tablet 1   clopidogrel (PLAVIX) 75 MG tablet Take 1 tablet (75 mg total) by mouth daily. 90 tablet 3   FEROSUL 325 (65 Fe) MG tablet Take 325 mg by mouth  daily.     fluticasone (FLONASE) 50 MCG/ACT nasal spray Place 2 sprays into both nostrils daily.     HYDROcodone-acetaminophen (NORCO/VICODIN) 5-325 MG tablet Take 2 tablets by mouth 2 (two) times daily.     lidocaine-prilocaine (EMLA) cream Apply 1 application topically Every Tuesday,Thursday,and Saturday with dialysis.      losartan (COZAAR) 25 MG tablet Take 25 mg by mouth daily.     prednisoLONE acetate (PRED FORTE) 1 % ophthalmic suspension Place 1 drop into the left eye every 4 (four) hours.     tamsulosin (FLOMAX) 0.4 MG CAPS capsule Take 0.4 mg by mouth daily after breakfast.      nitroGLYCERIN (NITROSTAT) 0.4 MG SL tablet Place 1 tablet (0.4 mg total) under the tongue every 5  (five) minutes as needed for chest pain.     Past Medical History:  Diagnosis Date   Anxiety    Cellulitis    CHF (congestive heart failure) (HCC)    Chronic diarrhea    Chronic kidney disease    Chronic pain of both ankles    Diabetes mellitus without complication (HCC)    Diabetes mellitus, type II (HCC)    Diabetic neuropathy (HCC)    Frequent falls    Gait instability    GERD (gastroesophageal reflux disease)    Heart palpitations    History of kidney stones    HLD (hyperlipidemia)    Hypertension    Insomnia    Nausea and vomiting in adult    recurrent   Past Surgical History:  Procedure Laterality Date   AMPUTATION Left 03/16/2016   Procedure: AMPUTATION DIGIT LEFT HALLUX;  Surgeon: Trula Slade, DPM;  Location: [place];  Service: Podiatry;  Laterality: Left;  can start around 3    ARTHROSCOPIC REPAIR ACL Left    AV FISTULA PLACEMENT Right 05/31/2019   Procedure: Brachiocephalic AV fistula creation;  Surgeon: Algernon Huxley, MD;  Location: [name];  Service: Vascular;  Laterality: Right;   COLONOSCOPY WITH PROPOFOL N/A 10/28/2015   Procedure: COLONOSCOPY WITH PROPOFOL;  Surgeon: Lollie Sails, MD;  Location: [facility] ENDOSCOPY;  Service: Endoscopy;  Laterality: N/A;   COLONOSCOPY WITH PROPOFOL N/A 10/29/2015   Procedure: COLONOSCOPY WITH PROPOFOL;  Surgeon: Lollie Sails, MD;  Location: [facility] ENDOSCOPY;  Service: Endoscopy;  Laterality: N/A;   CORONARY/GRAFT ACUTE MI REVASCULARIZATION N/A 02/26/2020   Procedure: Coronary/Graft Acute MI Revascularization;  Surgeon: Jettie Booze, MD;  Location: [place] CV LAB;  Service: Cardiovascular;  Laterality: N/A;   DIALYSIS/PERMA CATHETER INSERTION Right 04/26/2019   Perm Cath    DIALYSIS/PERMA CATHETER INSERTION N/A 04/26/2019   Procedure: DIALYSIS/PERMA CATHETER INSERTION;  Surgeon: Algernon Huxley, MD;  Location: [place] CV LAB;  Service: Cardiovascular;  Laterality: N/A;   DIALYSIS/PERMA CATHETER REMOVAL N/A 08/15/2019    Procedure: DIALYSIS/PERMA CATHETER REMOVAL;  Surgeon: Katha Cabal, MD;  Location: [place] CV LAB;  Service: Cardiovascular;  Laterality: N/A;   ESOPHAGOGASTRODUODENOSCOPY (EGD) WITH PROPOFOL N/A 12/27/2017   Procedure: ESOPHAGOGASTRODUODENOSCOPY (EGD) WITH PROPOFOL;  Surgeon: Toledo, Benay Pike, MD;  Location: [name] ENDOSCOPY;  Service: Gastroenterology;  Laterality: N/A;   INTRAVASCULAR ULTRASOUND/IVUS N/A 02/26/2020   Procedure: Intravascular Ultrasound/IVUS;  Surgeon: Jettie Booze, MD;  Location: [place] CV LAB;  Service: Cardiovascular;  Laterality: N/A;   LEFT HEART CATH AND CORONARY ANGIOGRAPHY N/A 02/26/2020   Procedure: LEFT HEART CATH AND CORONARY ANGIOGRAPHY;  Surgeon: Jettie Booze, MD;  Location: [name] [facility] LAB;  Service: Cardiovascular;  Laterality: N/A;   PROSTATE SURGERY  2016   TONSILECTOMY/ADENOIDECTOMY WITH MYRINGOTOMY     TONSILLECTOMY     UPPER EXTREMITY ANGIOGRAPHY Right 11/26/2020   Procedure: Upper Extremity Angiography;  Surgeon: Algernon Huxley, MD;  Location: [place] [facility];  Service: Cardiovascular;  Laterality: Right;   Social History Social History   Tobacco Use   Smoking status: Never   Smokeless tobacco: Never  Vaping Use   Vaping Use: Never used  Substance Use Topics   Alcohol use: No   Drug use: No   Family History Family History  Problem Relation Age of Onset   CAD Father    Stroke Father    Diabetes Mellitus II Mother    Kidney failure Mother    Schizophrenia Mother   Denies family history of peripheral artery disease or venous disease.  Positive for renal disease on his mother side.  Allergies  Allergen Reactions   Levofloxacin Swelling    Of face Other reaction(s): swelling   Promethazine Diarrhea and Other (See Comments)    Other reaction(s): Other (See Comments), Other (See Comments) Other reaction(s): Muscle cramps Muscle cramps Cramping Other reaction(s): Muscle cramps Muscle cramps   Other      Other reaction(s): Unknown   Malt Other (See Comments)    Other reaction(s): cramping   REVIEW OF SYSTEMS (Negative unless checked)  Constitutional: [] Weight loss  [] Fever  [] Chills Cardiac: [] Chest pain   [] Chest pressure   [] Palpitations   [] Shortness of breath when laying flat   [] Shortness of breath at rest   [x] Shortness of breath with exertion. Vascular:  [] Pain in legs with walking   [] Pain in legs at rest   [] Pain in legs when laying flat   [] Claudication   [] Pain in feet when walking  [] Pain in feet at rest  [] Pain in feet when laying flat   [] History of DVT   [] Phlebitis   [x] Swelling in legs   [] Varicose veins   [] Non-healing ulcers Pulmonary:   [] Uses home oxygen   [] Productive cough   [] Hemoptysis   [] Wheeze  [] COPD   [] Asthma Neurologic:  [] Dizziness  [] Blackouts   [] Seizures   [] History of stroke   [] History of TIA  [] Aphasia   [] Temporary blindness   [] Dysphagia   [] Weakness or numbness in arms   [] Weakness or numbness in legs Musculoskeletal:  [] Arthritis   [] Joint swelling   [] Joint pain   [] Low back pain Hematologic:  [] Easy bruising  [] Easy bleeding   [] Hypercoagulable state   [x] Anemic  [] Hepatitis Gastrointestinal:  [] Blood in stool   [] Vomiting blood  [] Gastroesophageal reflux/heartburn   [] Difficulty swallowing. Genitourinary:  [x] Chronic kidney disease   [] Difficult urination  [] Frequent urination  [] Burning with urination   [] Blood in urine Skin:  [] Rashes   [] Ulcers   [] Wounds Psychological:  [] History of anxiety   []  History of major depression.  Physical Examination  Vitals:   01/29/21 1215 01/29/21 1230 01/29/21 1245 01/29/21 1410  BP: (!) 154/80 (!) 149/89 (!) 171/84 102/61  Pulse: 67 68 70 71  Resp: 12 11 12 13   Temp:   98.4 F (36.9 C)   TempSrc:   Oral   SpO2:    95%  Weight:      Height:       Body mass index is 25.68 kg/m. Gen:  WD/WN, NAD Head: [place]/AT, No temporalis wasting. Prominent temp pulse not noted. Ear/Nose/Throat: Hearing grossly  intact, nares w/o erythema or drainage, oropharynx w/o Erythema/Exudate Eyes: Sclera non-icteric, conjunctiva  clear Neck: Trachea midline.  No JVD.  Pulmonary:  Good air movement, respirations not labored, equal bilaterally.  Cardiac: RRR, normal S1, S2. Vascular:  Vessel Right Left  Radial Palpable Palpable  Ulnar Palpable Palpable   Right upper extremity: Currently, the patient has his hand in a bag.  Examination reveals an open amputation site to the right fourth finger.  Foul odor.  Gangrenous changes.  Gastrointestinal: soft, non-tender/non-distended. No guarding/reflex.  Musculoskeletal: M/S 5/5 throughout.  Extremities without ischemic changes.  No deformity or atrophy. No edema. Neurologic: Sensation grossly intact in extremities.  Symmetrical.  Speech is fluent. Motor exam as listed above. Psychiatric: Judgment intact, Mood & affect appropriate for pt's clinical situation. Dermatologic: As above  Lymph : No Cervical, Axillary, or Inguinal lymphadenopathy.  CBC Lab Results  Component Value Date   WBC 10.3 01/29/2021   HGB 7.7 (L) 01/29/2021   HCT 25.3 (L) 01/29/2021   MCV 78.6 (L) 01/29/2021   PLT 240 01/29/2021   BMET    Component Value Date/Time   NA 138 01/28/2021 1831   K 3.7 01/28/2021 1831   CL 109 01/28/2021 1831   CO2 16 (L) 01/28/2021 1831   GLUCOSE 160 (H) 01/28/2021 1831   BUN 66 (H) 01/28/2021 1831   CREATININE 13.07 (H) 01/29/2021 0735   CALCIUM 7.8 (L) 01/28/2021 1831   GFRNONAA 4 (L) 01/29/2021 0735   GFRAA 5 (L) 08/27/2019 1540   Estimated Creatinine Clearance: 7.9 mL/min (A) (by C-G formula based on SCr of 13.07 mg/dL (H)).  COAG Lab Results  Component Value Date   INR 1.5 (H) 01/08/2021   INR 1.2 11/27/2020   INR 1.2 11/24/2020   Radiology DG Hand Complete Right  Result Date: 01/28/2021 CLINICAL DATA:  Pt comes into the ED via POV c/o infection in his right hand. 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 right hand  pain s/p amputation, continued EXAM: RIGHT HAND - COMPLETE 3+ VIEW COMPARISON:  X-ray right hand 11/24/2020 FINDINGS: Status post amputation of the fourth digit proximal phalanx body. Sharp margins. No cortical erosion or destruction. There is no evidence of fracture or dislocation. There is no evidence of severe arthropathy or aggressive appearing focal bone abnormality. Soft tissues are unremarkable. Vascular calcifications. 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 Electronically Signed   By: Iven Finn M.D.   On: 01/28/2021 18:55   CT HEAD CODE STROKE WO CONTRAST  Result Date: 01/08/2021 CLINICAL DATA:  Code stroke.  Neuro deficit, acute, stroke suspected EXAM: CT HEAD WITHOUT CONTRAST TECHNIQUE: Contiguous axial images were obtained from the base of the skull through the vertex without intravenous contrast. COMPARISON:  December 08, 2020. FINDINGS: Brain: No evidence of acute infarction, hemorrhage, hydrocephalus, extra-axial collection or mass lesion/mass effect. Vascular: No hyperdense vessel identified. Intracranial and extracranial calcific atherosclerosis. Skull: No acute fracture. Chronic thickening of the skin along the occiput/upper neck. Sinuses/Orbits: Moderate paranasal sinus mucosal thickening Other: No acute abnormality. ASPECTS [facility] Stroke Program Early CT Score) total score (0-10 with 10 being normal): 10. IMPRESSION: No evidence of acute abnormality on this mildly motion limited exam. ASPECTS is 10. Code stroke imaging results were communicated on 01/08/2021 at 1:57 pm to provider Dr. Leonel Ramsay via secure text paging. Electronically Signed   By: Margaretha Sheffield M.D.   On: 01/08/2021 13:59    Assessment/Plan Tyller Ignatz Deis. is a 50 y.o. male with medical history significant for DM, diet controlled, HTN, ESRD on  HD twice weekly per patient choice, diastolic CHF, CAD s/p STEMI 02/2020, A. fib on Eliquis, with recurrent RVR requiring  electrical cardioversion, anemia of CKD, recurrent postdialysis syncopal episodes, prolonged QTC, PVD s/p right finger amputation (Dr. Peggye Ley) which has failed to heal.  1.  Status post right fourth finger amputation: Patient underwent amputation of the right fourth finger due to gangrenous changes with Dr. Peggye Ley in October.  Unfortunately, the amputation site has not healed.  Patient has been experiencing progressively worsening discomfort to the site which prompted him to seek medical attention in emergency department.  On examination the stump has clearly failed with necrotic changes and foul odor.  Patient has been admitted to the hospital for IV antibiotics and vascular surgery has been consulted to undergo an angiogram and attempt to assess his arterial patency and if appropriate an attempt to revascularize the right upper extremity will be made at that time.  We will plan on this Tuesday with Dr. Delana Meyer.  If the opportunity presents itself in the procedure can be done sooner we will certainly attempt that.  Patient understands the plan is at this point for Tuesday.  We discussed the procedure, risk and benefits.  All questions were answered.  He wishes to proceed.  We discussed the procedure in the presence of his wife.  2.  End-stage renal disease: Patient states he dialyzed today. Nephrology is following  3.  Diabetes type 2: Diet controlled? Certainly is a risk factor for wound healing This will be followed closely while he is inpatient.  Discussed with Dr. Francene Castle, PA-C 01/29/2021 4:11 PM  This note was created with Dragon medical transcription system.  Any error is purely unintentional.

## 2021-01-29 NOTE — ED Provider Notes (Signed)
[facility] Emergency Department Provider Note  ____________________________________________  Time seen: Approximately 12:55 AM  I have reviewed the triage vital signs and the nursing notes.   HISTORY  Chief Complaint Wound Infection   HPI [name]. is a 50 y.o. 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  Patient noticed over the last 2 days copious amount of purulent foul-smelling discharge coming from the wound which is now dehisced.  He is complaining of severe constant sharp throbbing pain.  Has been taking his p.o. meds at home with no significant relief of the pain.  No fever, no vomiting.  Patient's finger was amputated by Dr. Peggye Ley in October 2022.  Past Medical History:  Diagnosis Date   Anxiety    Cellulitis    CHF (congestive heart failure) (HCC)    Chronic diarrhea    Chronic kidney disease    Chronic pain of both ankles    Diabetes mellitus without complication (HCC)    Diabetes mellitus, type II ([place])    Diabetic neuropathy (HCC)    Frequent falls    Gait instability    GERD (gastroesophageal reflux disease)    Heart palpitations    History of kidney stones    HLD (hyperlipidemia)    Hypertension    Insomnia    Nausea and vomiting in adult    recurrent    Patient Active Problem List   Diagnosis Date Noted   Prolonged Q-T interval on ECG    Anemia due to chronic kidney disease, on chronic dialysis (HCC)    Atrial fibrillation with RVR ([place]) 12/08/2020   Finger amputation, 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 27/74/1287   Atrial fibrillation with rapid ventricular response (HCC)    Gangrene of finger of right hand ([place]) 11/25/2020   Postural dizziness with presyncope 11/24/2020   Elevated troponin 11/24/2020   Left leg cellulitis 11/24/2020   Frequent falls 11/24/2020    Finger infection, fourth left 11/24/2020   AF (paroxysmal atrial fibrillation) ([place]) 11/24/2020   Hypotension 11/24/2020   NSTEMI (non-ST elevated myocardial infarction) ([place]) 11/24/2020   Abnormal gait 08/19/2020   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[place]) 08/19/2020   Anxiety state 08/19/2020   Callosity 08/19/2020   ED (erectile dysfunction) of organic origin 08/19/2020   Heart failure ([place]) 08/19/2020   Long term (current) use of insulin ([place]) 08/19/2020   Myalgia 08/19/2020   Obstructive uropathy 08/19/2020   Reactive depression 08/19/2020   History of ST elevation myocardial infarction (STEMI) 02/2020 s/p DES to LAD 08/19/2020   ESRD (end stage renal disease) ([place]) 08/19/2020   Blind painful left eye 08/15/2020   Combined forms of age-related cataract of right eye 04/17/2020   Supraventricular tachycardia ([place]) 02/26/2020   Acute anterior wall MI ([place])    Benign prostatic hyperplasia with lower urinary tract symptoms 11/07/2019   Fluid overload, unspecified 08/15/2019   Hypertension secondary to other renal disorders 07/20/2019   NVG (neovascular glaucoma), left, 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   Symptomatic anemia 06/05/2019   Aftercare including intermittent dialysis ([place]) 05/15/2019   Moderate protein-calorie malnutrition ([place]) 05/14/2019   Anaphylactic reaction due to adverse effect of correct drug or medicament properly administered, initial encounter 05/08/2019   Chronic anemia 05/07/2019   Chest pain, unspecified 05/07/2019   Coagulation defect, unspecified ([place]) 05/07/2019   Disorder of phosphorus metabolism, unspecified  05/07/2019   Encounter for fitting and adjustment of extracorporeal dialysis catheter ([place]) 05/07/2019   Gastroparesis 05/07/2019   Iron deficiency anemia, unspecified 05/07/2019   Major depressive disorder, recurrent, unspecified ([place]) 05/07/2019   Pain, unspecified 05/07/2019   Pruritus, unspecified 05/07/2019   Dizziness 71/07/2692   Metabolic  acidosis 85/46/2703   Syncope 04/06/2019   Urinary retention 04/06/2019   Hypertensive chronic kidney disease with stage 1 through stage 4 chronic kidney disease, or unspecified chronic kidney disease 11/28/2018   Edema 11/28/2018   Hypokalemia 11/28/2018   Hypo-osmolality and hyponatremia 11/28/2018   Proteinuria 11/28/2018   Secondary hyperparathyroidism of renal origin ([place]) 11/28/2018   Lymphedema 01/09/2018   Chronic diastolic heart failure ([place]) 12/13/2017   Vomiting 11/28/2017   Acute on chronic heart failure ([place]) 11/23/2017   Type II diabetes mellitus ([place]) 11/23/2017   Diabetic retinopathy ([place]) 07/05/2017   Uncontrolled type 2 diabetes mellitus with hyperglycemia ([place]) 07/08/2016   Uncontrolled type 2 diabetes mellitus with hyperglycemia, with long-term current use of insulin ([place]) 04/20/2016   Osteomyelitis of toe of left foot ([place]) 03/15/2016   Acute renal failure with tubular necrosis ([place]) 06/23/2015   Diarrhea 06/23/2015   Mixed hyperlipidemia    Essential hypertension, benign    Heart palpitations 05/06/2015   Sepsis ([place]) 03/07/2015    Past Surgical History:  Procedure Laterality Date   AMPUTATION Left 03/16/2016   Procedure: AMPUTATION DIGIT LEFT HALLUX;  Surgeon: Trula Slade, DPM;  Location: [facility];  Service: Podiatry;  Laterality: Left;  can start around 5    ARTHROSCOPIC REPAIR ACL Left    AV FISTULA PLACEMENT Right 05/31/2019   Procedure: Brachiocephalic AV fistula creation;  Surgeon: Algernon Huxley, MD;  Location: [name];  Service: Vascular;  Laterality: Right;   COLONOSCOPY WITH PROPOFOL N/A 10/28/2015   Procedure: COLONOSCOPY WITH PROPOFOL;  Surgeon: Lollie Sails, MD;  Location: [facility] ENDOSCOPY;  Service: Endoscopy;  Laterality: N/A;   COLONOSCOPY WITH PROPOFOL N/A 10/29/2015   Procedure: COLONOSCOPY WITH PROPOFOL;  Surgeon: Lollie Sails, MD;  Location: [facility] ENDOSCOPY;  Service: Endoscopy;  Laterality: N/A;   CORONARY/GRAFT ACUTE MI  REVASCULARIZATION N/A 02/26/2020   Procedure: Coronary/Graft Acute MI Revascularization;  Surgeon: Jettie Booze, MD;  Location: [place] [facility];  Service: Cardiovascular;  Laterality: N/A;   DIALYSIS/PERMA CATHETER INSERTION Right 04/26/2019   Perm Cath    DIALYSIS/PERMA CATHETER INSERTION N/A 04/26/2019   Procedure: DIALYSIS/PERMA CATHETER INSERTION;  Surgeon: Algernon Huxley, MD;  Location: [place] [facility];  Service: Cardiovascular;  Laterality: N/A;   DIALYSIS/PERMA CATHETER REMOVAL N/A 08/15/2019   Procedure: DIALYSIS/PERMA CATHETER REMOVAL;  Surgeon: Katha Cabal, MD;  Location: [place] [facility];  Service: Cardiovascular;  Laterality: N/A;   ESOPHAGOGASTRODUODENOSCOPY (EGD) WITH PROPOFOL N/A 12/27/2017   Procedure: ESOPHAGOGASTRODUODENOSCOPY (EGD) WITH PROPOFOL;  Surgeon: Tol[place], Benay Pike, MD;  Location: [name] ENDOSCOPY;  Service: Gastroenterology;  Laterality: N/A;   INTRAVASCULAR ULTRASOUND/IVUS N/A 02/26/2020   Procedure: Intravascular Ultrasound/IVUS;  Surgeon: Jettie Booze, MD;  Location: [place] [facility];  Service: Cardiovascular;  Laterality: N/A;   LEFT HEART CATH AND CORONARY ANGIOGRAPHY N/A 02/26/2020   Procedure: LEFT HEART CATH AND CORONARY ANGIOGRAPHY;  Surgeon: Jettie Booze, MD;  Location: [place] [facility];  Service: Cardiovascular;  Laterality: N/A;   PROSTATE SURGERY  2016   TONSILECTOMY/ADENOIDECTOMY WITH MYRINGOTOMY     TONSILLECTOMY     UPPER EXTREMITY ANGIOGRAPHY Right 11/26/2020   Procedure: Upper Extremity Angiography;  Surgeon:  Algernon Huxley, MD;  Location: [place] [facility];  Service: Cardiovascular;  Laterality: Right;    Prior to Admission medications   Medication Sig Start Date End Date Taking? Authorizing Provider  amiodarone (PACERONE) 200 MG tablet Take 1 tablet (200 mg total) by mouth daily. 1 twice daily for 2 weeks, then 1 daily 01/09/21   Eugenie Filler, MD  apixaban (ELIQUIS) 5 MG TABS tablet Take 1 tablet (5 mg  total) by mouth 2 (two) times daily. 11/27/20   Sharen Hones, MD  atorvastatin (LIPITOR) 80 MG tablet TAKE 1 TABLET (80 MG TOTAL) BY MOUTH AT BEDTIME. 02/28/20 02/27/21  Reino Bellis B, NP  azelastine (ASTELIN) 0.1 % nasal spray Place 2 sprays into both nostrils 2 (two) times daily. 09/08/20   [provider]  calcium acetate (PHOSLO) 667 MG capsule Take 3 capsules (2,001 mg total) by mouth 3 (three) times daily with meals. 11/27/20   Sharen Hones, MD  carvedilol (COREG) 25 MG tablet Take 1 tablet (25 mg total) by mouth 2 (two) times daily with a meal. 12/09/20   Johnson, Clanford L, MD  clopidogrel (PLAVIX) 75 MG tablet Take 1 tablet (75 mg total) by mouth daily. 12/09/20   Johnson, Clanford L, MD  FEROSUL 325 (65 Fe) MG tablet Take 325 mg by mouth daily. 09/08/20   [provider]  fluticasone (FLONASE) 50 MCG/ACT nasal spray Place 2 sprays into both nostrils daily. 09/08/20   [provider]  HYDROcodone-acetaminophen (NORCO/VICODIN) 5-325 MG tablet Take 2 tablets by mouth 2 (two) times daily. 01/23/21   [provider]  lidocaine-prilocaine (EMLA) cream Apply 1 application topically Every Tuesday,Thursday,and Saturday with dialysis.  08/15/19   [provider]  losartan (COZAAR) 25 MG tablet Take 25 mg by mouth daily. 01/21/21   [provider]  nitroGLYCERIN (NITROSTAT) 0.4 MG SL tablet Place 1 tablet (0.4 mg total) under the tongue every 5 (five) minutes as needed for chest pain. 12/09/20 12/09/21  Johnson, Clanford L, MD  prednisoLONE acetate (PRED FORTE) 1 % ophthalmic suspension Place 1 drop into the left eye every 4 (four) hours.    [provider]  tamsulosin (FLOMAX) 0.4 MG CAPS capsule Take 0.4 mg by mouth daily after breakfast.     [provider]    Allergies Levofloxacin, Promethazine, Other, and Malt  Family History  Problem Relation Age of Onset   CAD Father    Stroke Father    Diabetes Mellitus II Mother     Kidney failure Mother    Schizophrenia Mother     Social History Social History   Tobacco Use   Smoking status: Never   Smokeless tobacco: Never  Vaping Use   Vaping Use: Never used  Substance Use Topics   Alcohol use: No   Drug use: No    Review of Systems  Constitutional: Negative for fever. Eyes: Negative for visual changes. ENT: Negative for sore throat. Neck: No neck pain  Cardiovascular: Negative for chest pain. Respiratory: Negative for shortness of breath. Gastrointestinal: Negative for abdominal pain, vomiting or diarrhea. Genitourinary: Negative for dysuria. Musculoskeletal: Negative for back pain. + R hand infection Skin: Negative for rash. Neurological: Negative for headaches, weakness or numbness. Psych: No SI or HI  ____________________________________________   PHYSICAL EXAM:  VITAL SIGNS: ED Triage Vitals  Enc Vitals Group     BP 01/28/21 1818 (!) 97/59     Pulse Rate 01/28/21 1818 60     Resp 01/28/21 1818 17  Temp 01/28/21 1818 98.6 F (37 C)     Temp Source 01/28/21 1818 Oral     SpO2 01/28/21 1818 99 %     Weight 01/28/21 1753 205 lb 7.5 oz (93.2 kg)     Height 01/28/21 1753 6\' 3"  (1.905 m)     Head Circumference --      Peak Flow --      Pain Score 01/28/21 1753 10     Pain Loc --      Pain Edu? --      Excl. in [place]? --     Constitutional: Alert and oriented. Well appearing and in no apparent distress. HEENT:      Head: Normocephalic and atraumatic.         Eyes: Conjunctivae are normal. Sclera is non-icteric.       Mouth/Throat: Mucous membranes are moist.       Neck: Supple with no signs of meningismus. Cardiovascular: Regular rate and rhythm. No murmurs, gallops, or rubs. 2+ symmetrical distal pulses are present in all extremities. No JVD. Respiratory: Normal respiratory effort. Lungs are clear to auscultation bilaterally.  Gastrointestinal: Soft, non tender, and non distended with positive bowel sounds. No rebound or  guarding. Genitourinary: No CVA tenderness. Musculoskeletal:  Amputation of the R 4th finger distally from MCP joint, dehisced with copious amount of foul purulent drainage Neurologic: Normal speech and language. Face is symmetric. Moving all extremities. No gross focal neurologic deficits are appreciated. Skin: Skin is warm, dry and intact. No rash noted. Psychiatric: Mood and affect are normal. Speech and behavior are normal.  ____________________________________________   LABS (all labs ordered are listed, but only abnormal results are displayed)  Labs Reviewed  COMPREHENSIVE METABOLIC PANEL - Abnormal; Notable for the following components:      Result Value   CO2 16 (*)    Glucose, Bld 160 (*)    BUN 66 (*)    Creatinine, Ser 12.06 (*)    Calcium 7.8 (*)    Total Protein 6.3 (*)    Albumin 2.9 (*)    AST 10 (*)    GFR, Estimated 5 (*)    All other components within normal limits  CBC WITH DIFFERENTIAL/PLATELET - Abnormal; Notable for the following components:   RBC 2.97 (*)    Hemoglobin 7.1 (*)    HCT 23.7 (*)    MCV 79.8 (*)    MCH 23.9 (*)    RDW 20.1 (*)    nRBC 1.2 (*)    Neutro Abs 8.1 (*)    Monocytes Absolute 1.1 (*)    All other components within normal limits  BODY FLUID CULTURE W GRAM STAIN  RESP PANEL BY RT-PCR (FLU A&B, COVID) ARPGX2  LACTIC ACID, PLASMA  URINALYSIS, ROUTINE W REFLEX MICROSCOPIC   ____________________________________________  EKG  none  ____________________________________________  RADIOLOGY  I have personally reviewed the images performed during this visit and I agree with the Radiologist's read.   Interpretation by Radiologist:  DG Hand Complete Right  Result Date: 01/28/2021 CLINICAL DATA:  Pt comes into the ED via POV c/o infection in his right hand. 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 right hand pain s/p amputation, continued EXAM: RIGHT HAND - COMPLETE 3+ VIEW COMPARISON:  X-ray right hand  11/24/2020 FINDINGS: Status post amputation of the fourth digit proximal phalanx body. Sharp margins. No cortical erosion or destruction. There is no evidence of fracture or dislocation. There is no evidence of severe  arthropathy or aggressive appearing focal bone abnormality. Soft tissues are unremarkable. Vascular calcifications. 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 Electronically Signed   By: Iven Finn M.D.   On: 01/28/2021 18:55     ____________________________________________   PROCEDURES  Procedure(s) performed: yes .1-3 Lead EKG Interpretation Performed by: Rudene Re, MD Authorized by: Rudene Re, MD     Interpretation: non-specific     ECG rate assessment: normal     Rhythm: sinus rhythm     Ectopy: none     Conduction: normal     Critical Care performed:  None ____________________________________________   INITIAL IMPRESSION / ASSESSMENT AND PLAN / ED COURSE  50 y.o. 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  Patient with decreased wound at the amputation site with large amount of purulent foul-smelling drainage.  Pus was swabbed and sent for culture.  Discussed with Dr. Sabra Heck who recommended admission to the hospitalist service and he will evaluate patient in the morning.  I agree with IV antibiotics.  No other recommendations at this time.  We will start patient on Zosyn and vancomycin.  X-ray showing no deep gas, there is no crepitus on exam.  Labs are otherwise with no signs of sepsis.  Old medical records review including note from operation performed by Dr. Lucky Cowboy.      _____________________________________________ Please note:  Patient was evaluated in Emergency Department today for the symptoms described in the history of present illness.  Patient was evaluated in the context of the global COVID-19 pandemic, which necessitated consideration that the patient might be at risk for infection with the SARS-CoV-2 virus that causes COVID-19. Institutional protocols and algorithms that pertain to the evaluation of patients at risk for COVID-19 are in a state of rapid change based on information released by regulatory bodies including the CDC and federal and state organizations. These policies and algorithms were followed during the patient's care in the ED.  Some ED evaluations and interventions may be delayed as a result of limited staffing during the pandemic.   [place] Controlled Substance Database was reviewed by me. ____________________________________________   FINAL CLINICAL IMPRESSION(S) / ED DIAGNOSES   Final diagnoses:  Wound infection      NEW MEDICATIONS STARTED DURING THIS VISIT:  ED Discharge Orders     None        Note:  This document was prepared using Dragon voice recognition software and may include unintentional dictation errors.    Rudene Re, MD 01/29/21 [phone]

## 2021-01-29 NOTE — ED Notes (Signed)
Pt at dialysis via transporter, pt sent with paper consent. Paper consent obtained an placed with paper chart.

## 2021-01-29 NOTE — Progress Notes (Signed)
Pharmacy Antibiotic Note  [name]. is a 50 y.o. male admitted on 01/29/2021 with Wound infection. Pt on HD PTA.  Pt on twice weekly HD "T-Th" at home.  Pharmacy has been consulted for Vanc, Zosyn  dosing.  Plan: Zosyn 3.375 gm IV X 1 given in ED on 12/8 @ 0133. Zosyn 2.25 gm IV Q8H ordered to start on 12/8 @ 1000.   Vancomycin 2 gm IV X 1 loading dose given on 12/8 @ 0203. Vancomycin 1 gm IV Q T-Th with HD ordered.  Follow up if changes to HD regimen while in patient Vancomycin random level prior to 2nd maintenance dose if warranted   Height: 6\' 3"  (190.5 cm) Weight: 93.2 kg (205 lb 7.5 oz) IBW/kg (Calculated) : 84.5  Temp (24hrs), Avg:98.4 F (36.9 C), Min:98.1 F (36.7 C), Max:98.6 F (37 C)  Recent Labs  Lab 01/28/21 1831 01/29/21 0735  WBC 10.5 10.3  CREATININE 12.06* 13.07*  LATICACIDVEN 1.5  --      Estimated Creatinine Clearance: 7.9 mL/min (A) (by C-G formula based on SCr of 13.07 mg/dL (H)).    Allergies  Allergen Reactions   Levofloxacin Swelling    Of face Other reaction(s): swelling   Promethazine Diarrhea and Other (See Comments)    Other reaction(s): Other (See Comments), Other (See Comments) Other reaction(s): Muscle cramps Muscle cramps Cramping Other reaction(s): Muscle cramps Muscle cramps   Other     Other reaction(s): Unknown   Malt Other (See Comments)    Other reaction(s): cramping    Antimicrobials this admission:  12/8 >> Zosyn   12/8 >> Vanco  Dose adjustments this admission:   Microbiology results: 12/8 Body fluid Cx (hand): sent    Thank you for allowing pharmacy to be a part of this patient's care.  Dorothe Pea, PharmD, BCPS Clinical Pharmacist   01/29/2021 10:29 AM

## 2021-01-29 NOTE — ED Notes (Signed)
Informed RN bed assigned 

## 2021-01-30 DIAGNOSIS — D631 Anemia in chronic kidney disease: Secondary | ICD-10-CM

## 2021-01-30 DIAGNOSIS — I480 Paroxysmal atrial fibrillation: Secondary | ICD-10-CM

## 2021-01-30 DIAGNOSIS — Z794 Long term (current) use of insulin: Secondary | ICD-10-CM

## 2021-01-30 DIAGNOSIS — N186 End stage renal disease: Secondary | ICD-10-CM

## 2021-01-30 DIAGNOSIS — I5032 Chronic diastolic (congestive) heart failure: Secondary | ICD-10-CM

## 2021-01-30 DIAGNOSIS — Z992 Dependence on renal dialysis: Secondary | ICD-10-CM

## 2021-01-30 DIAGNOSIS — E119 Type 2 diabetes mellitus without complications: Secondary | ICD-10-CM

## 2021-01-30 DIAGNOSIS — T8131XA Disruption of external operation (surgical) wound, not elsewhere classified, initial encounter: Secondary | ICD-10-CM

## 2021-01-30 LAB — HEMOGLOBIN A1C
Hgb A1c MFr Bld: 5.4 % (ref 4.8–5.6)
Mean Plasma Glucose: 108 mg/dL

## 2021-01-30 LAB — CBC
HCT: 23.8 % — ABNORMAL LOW (ref 39.0–52.0)
Hemoglobin: 7.2 g/dL — ABNORMAL LOW (ref 13.0–17.0)
MCH: 23.2 pg — ABNORMAL LOW (ref 26.0–34.0)
MCHC: 30.3 g/dL (ref 30.0–36.0)
MCV: 76.8 fL — ABNORMAL LOW (ref 80.0–100.0)
Platelets: 242 10*3/uL (ref 150–400)
RBC: 3.1 MIL/uL — ABNORMAL LOW (ref 4.22–5.81)
RDW: 19.6 % — ABNORMAL HIGH (ref 11.5–15.5)
WBC: 10.9 10*3/uL — ABNORMAL HIGH (ref 4.0–10.5)
nRBC: 0 % (ref 0.0–0.2)

## 2021-01-30 LAB — BASIC METABOLIC PANEL
Anion gap: 11 (ref 5–15)
BUN: 49 mg/dL — ABNORMAL HIGH (ref 6–20)
CO2: 22 mmol/L (ref 22–32)
Calcium: 7.7 mg/dL — ABNORMAL LOW (ref 8.9–10.3)
Chloride: 100 mmol/L (ref 98–111)
Creatinine, Ser: 9.15 mg/dL — ABNORMAL HIGH (ref 0.61–1.24)
GFR, Estimated: 6 mL/min — ABNORMAL LOW (ref >60)
Glucose, Bld: 120 mg/dL — ABNORMAL HIGH (ref 70–99)
Potassium: 3.2 mmol/L — ABNORMAL LOW (ref 3.5–5.1)
Sodium: 133 mmol/L — ABNORMAL LOW (ref 135–145)

## 2021-01-30 LAB — GLUCOSE, CAPILLARY
Glucose-Capillary: 80 mg/dL (ref 70–99)
Glucose-Capillary: 81 mg/dL (ref 70–99)

## 2021-01-30 LAB — HEPATITIS B SURFACE ANTIBODY, QUANTITATIVE: Hep B S AB Quant (Post): 46.6 m[IU]/mL (ref >9.9)

## 2021-01-30 MED ORDER — VANCOMYCIN HCL IN DEXTROSE 1-5 GM/200ML-% IV SOLN
1000.0000 mg | INTRAVENOUS | Status: DC
Start: 2021-01-31 — End: 2021-01-30

## 2021-01-30 MED ORDER — AMOXICILLIN-POT CLAVULANATE 500-125 MG PO TABS: 1.0000 | ORAL_TABLET | Freq: Every day | ORAL | 0 refills | Status: DC

## 2021-01-30 MED ORDER — RENA-VITE PO TABS
1.0000 | ORAL_TABLET | Freq: Every day | ORAL | Status: DC
  Filled 2021-01-30: qty 1

## 2021-01-30 MED ORDER — PROSOURCE PLUS PO LIQD
30.0000 mL | Freq: Three times a day (TID) | ORAL | Status: DC
  Filled 2021-01-30: qty 30

## 2021-01-30 MED ORDER — EPOETIN ALFA 10000 UNIT/ML IJ SOLN: 10000.0000 [IU] | INTRAMUSCULAR | Status: DC

## 2021-01-30 MED ORDER — DOXYCYCLINE MONOHYDRATE 100 MG PO TABS: 100.0000 mg | ORAL_TABLET | Freq: Two times a day (BID) | ORAL | 0 refills | Status: DC

## 2021-01-30 MED ORDER — ACETAMINOPHEN 325 MG PO TABS
650.0000 mg | ORAL_TABLET | Freq: Four times a day (QID) | ORAL | Status: DC | PRN
Start: 2021-01-30 — End: 2021-04-20

## 2021-01-30 NOTE — Progress Notes (Signed)
After clarification with Dr Ouida Sills via secure chat, patient given discharge instructions, saline lock removed, and he is currently waiting on his wife.

## 2021-01-30 NOTE — Progress Notes (Signed)
[facility]  ROUNDING NOTE   Subjective:   [name]. is a 50 year old male with a past medical history significant for diabetes, hypertension, diastolic CHF, CAD with STEMI January 2022, and end stage renal disease on dialysis. Patient presents to emergency room with complains of pain and drainage from finger. Patient has been admitted for Infected wound [T14.8XXA, L08.9] Wound infection [T14.8XXA, L08.9]  Patient is known to our clinic and receives outpatient treatment from [facility] on a TTS schedule. He is supervised by Dr Juleen China.    Patient seen sitting at side of bed Alert and oriented Voices frustration with care.  Feels too much was pulled with dialysis, legs cramping Upset at small amount of food on dining tray   Objective:  Vital signs in last 24 hours:  Temp:  [98.1 F (36.7 C)-99.1 F (37.3 C)] 98.3 F (36.8 C) (12/09 0801) Pulse Rate:  [62-74] 68 (12/09 0801) Resp:  [9-19] 17 (12/09 0801) BP: (102-176)/(60-92) 176/72 (12/09 0801) SpO2:  [95 %-100 %] 99 % (12/09 0801)  Weight change: 0 kg Filed Weights   01/28/21 1753 01/29/21 0856  Weight: 93.2 kg 93.2 kg    Intake/Output: I/O last 3 completed shifts: In: 975.5 [P.O.:236; IV Piggyback:739.5] Out: 1506 [Other:1506]   Intake/Output this shift:  Total I/O In: 240 [P.O.:240] Out: -   Physical Exam: General: NAD, sitting at bedside  Head: Normocephalic, atraumatic. Moist oral mucosal membranes  Eyes: Anicteric  Lungs:  Clear to auscultation, normal effort  Heart: Regular rate and rhythm  Abdomen:  Soft, nontender  Extremities:  1+ peripheral edema.  Neurologic: Nonfocal, moving all four extremities  Skin:  Right 4th finger wound   Access: Lt AVF    Basic Metabolic Panel: Recent Labs  Lab 01/28/21 1831 01/29/21 0735 01/29/21 1152 01/30/21 0412  NA 138  --   --  133*  K 3.7  --   --  3.2*  CL 109  --   --  100  CO2 16*  --   --  22  GLUCOSE 160*  --   --  120*   BUN 66*  --   --  49*  CREATININE 12.06* 13.07*  --  9.15*  CALCIUM 7.8*  --   --  7.7*  PHOS  --   --  3.8  --      Liver Function Tests: Recent Labs  Lab 01/28/21 1831  AST 10*  ALT 11  ALKPHOS 64  BILITOT 0.6  PROT 6.3*  ALBUMIN 2.9*    No results for input(s): LIPASE, AMYLASE in the last 168 hours. No results for input(s): AMMONIA in the last 168 hours.  CBC: Recent Labs  Lab 01/28/21 1831 01/29/21 0735 01/30/21 0412  WBC 10.5 10.3 10.9*  NEUTROABS 8.1*  --   --   HGB 7.1* 7.7* 7.2*  HCT 23.7* 25.3* 23.8*  MCV 79.8* 78.6* 76.8*  PLT 248 240 242     Cardiac Enzymes: No results for input(s): CKTOTAL, CKMB, CKMBINDEX, TROPONINI in the last 168 hours.  BNP: Invalid input(s): POCBNP  CBG: Recent Labs  Lab 01/29/21 0415 01/29/21 2157 01/30/21 0801  GLUCAP 79 171* 62     Microbiology: Results for orders placed or performed during the hospital encounter of 01/29/21  Body fluid culture w Gram Stain     Status: None (Preliminary result)   Collection Time: 01/29/21  1:06 AM   Specimen: Hand; Body Fluid  Result Value Ref Range Status   Specimen Description  Final    HAND LEFT Performed at [facility], [address] [place], [place] [address]    Special Requests   Final    NONE Performed at [facility], [place], [place] [address]    Gram Stain   Final    ABUNDANT WBC PRESENT, PREDOMINANTLY PMN MODERATE GRAM POSITIVE COCCI MODERATE GRAM NEGATIVE RODS RARE GRAM POSITIVE RODS Performed at [facility], [place] [address], [place], [place] [address]    Culture PENDING  Incomplete   Report Status PENDING  Incomplete  Resp Panel by RT-PCR (Flu A&B, Covid) Hand, Left     Status: None   Collection Time: 01/29/21  1:06 AM   Specimen: Hand, Left; Nasopharyngeal(NP) swabs in vial transport medium  Result Value Ref Range Status   SARS Coronavirus 2 by RT PCR NEGATIVE NEGATIVE Final    Comment: (NOTE) SARS-CoV-2  target nucleic acids are NOT DETECTED.  The SARS-CoV-2 RNA is generally detectable in upper respiratory specimens during the acute phase of infection. The lowest concentration of SARS-CoV-2 viral copies this assay can detect is 138 copies/mL. A negative result does not preclude SARS-Cov-2 infection and should not be used as the sole basis for treatment or other patient management decisions. A negative result may occur with  improper specimen collection/handling, submission of specimen other than nasopharyngeal swab, presence of viral mutation(s) within the areas targeted by this assay, and inadequate number of viral copies(<138 copies/mL). A negative result must be combined with clinical observations, patient history, and epidemiological information. The expected result is Negative.  Fact Sheet for Patients:  EntrepreneurPulse.com.au  Fact Sheet for Healthcare Providers:  IncredibleEmployment.be  This test is no t yet approved or cleared by the [place] FDA and  has been authorized for detection and/or diagnosis of SARS-CoV-2 by FDA under an Emergency Use Authorization (EUA). This EUA will remain  in effect (meaning this test can be used) for the duration of the COVID-19 declaration under Section 564(b)(1) of the Act, 21 U.S.C.section 360bbb-3(b)(1), unless the authorization is terminated  or revoked sooner.       Influenza A by PCR NEGATIVE NEGATIVE Final   Influenza B by PCR NEGATIVE NEGATIVE Final    Comment: (NOTE) The Xpert Xpress SARS-CoV-2/FLU/RSV plus assay is intended as an aid in the diagnosis of influenza from Nasopharyngeal swab specimens and should not be used as a sole basis for treatment. Nasal washings and aspirates are unacceptable for Xpert Xpress SARS-CoV-2/FLU/RSV testing.  Fact Sheet for Patients: EntrepreneurPulse.com.au  Fact Sheet for Healthcare  Providers: IncredibleEmployment.be  This test is not yet approved or cleared by the [place] FDA and has been authorized for detection and/or diagnosis of SARS-CoV-2 by FDA under an Emergency Use Authorization (EUA). This EUA will remain in effect (meaning this test can be used) for the duration of the COVID-19 declaration under Section 564(b)(1) of the Act, 21 U.S.C. section 360bbb-3(b)(1), unless the authorization is terminated or revoked.  Performed at [facility], [address] [place], [place] [address]     Coagulation Studies: No results for input(s): LABPROT, INR in the last 72 hours.  Urinalysis: No results for input(s): COLORURINE, LABSPEC, PHURINE, GLUCOSEU, HGBUR, BILIRUBINUR, KETONESUR, PROTEINUR, UROBILINOGEN, NITRITE, LEUKOCYTESUR in the last 72 hours.  Invalid input(s): APPERANCEUR    Imaging: DG Hand Complete Right  Result Date: 01/28/2021 CLINICAL DATA:  Pt comes into the ED via POV c/o infection in his right hand. 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 Sharp margins. No cortical erosion or destruction. There is no evidence of fracture or dislocation. There is no evidence of severe arthropathy or aggressive appearing focal bone abnormality. Soft tissues are unremarkable. Vascular calcifications. 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 Electronically Signed   By: Iven Finn M.D.   On: 01/28/2021 18:55     Medications:    piperacillin-tazobactam (ZOSYN)  IV 2.25 g (01/30/21 0508)   [START ON 01/31/2021] vancomycin      atorvastatin  80 mg Oral QHS   azelastine  2 spray Each Nare BID   calcium acetate  2,001 mg Oral TID WC   carvedilol  25 mg Oral BID WC   Chlorhexidine  Gluconate Cloth  6 each Topical Q0600   clopidogrel  75 mg Oral Daily   ferrous sulfate  325 mg Oral Daily   fluticasone  2 spray Each Nare Daily   heparin  5,000 Units Subcutaneous Q8H   [START ON 01/31/2021] lidocaine-prilocaine  1 application Topical Q T,Th,Sa-HD   losartan  25 mg Oral Daily   prednisoLONE acetate  1 drop Left Eye Q4H   tamsulosin  0.4 mg Oral QPC breakfast   acetaminophen **OR** acetaminophen, HYDROmorphone (DILAUDID) injection, nitroGLYCERIN  Assessment/ Plan:  Mr. Kadarious Dikes. is a 50 y.o.  male with a past medical history significant for diabetes, hypertension, diastolic CHF, CAD with STEMI January 2022, and end stage renal disease on dialysis. Patiet presents to emergency room with complains of pain and drainage from finger. Patient has been admitted for Infected wound [T14.8XXA, L08.9] Wound infection [T14.8XXA, L08.9]  [facility] TTS [facility] Left AVF 93kg  End stage renal disease on dialysis. Received dialysis yesterday, UF goal 1.5L achieved. Next treatment scheduled for Saturday.   2. Anemia of chronic kidney disease.  Lab Results  Component Value Date   HGB 7.2 (L) 01/30/2021  Will order EPO 10000 units IV with treatments  3. Secondary Hyperparathyroidism:  Lab Results  Component Value Date   CALCIUM 7.7 (L) 01/30/2021   CAION 0.94 (L) 01/08/2021   PHOS 3.8 01/29/2021    Continue calcium acetate with meals  4. Hypertension with chronic kidney disease. Home regimen includes losartan, carvedilol and amiodarone. Currently receiving carvedilol.   5. Wound dehiscence with infection of fourth finger amputation. Vascular surgery angiogram scheduled for Tuesday. Pain management and antibiotics.    LOS: 1   12/9/202210:38 AM

## 2021-01-30 NOTE — Discharge Summary (Signed)
Physician Discharge Summary  [name]. VHQ:[id number] DOB: Apr 03, 1970 DOA: 01/29/2021  PCP: [name] Schwalbe, MD  Admit date: 01/29/2021 Discharge date: 01/30/2021  Admitted From: Home Disposition: Home  Recommendations for Outpatient Follow-up:  Follow up with vascular surgery to complete amputation as scheduled outpatient on 12/13. He is prescribed augmentin and doxycycline to continue past date of surgery. Follow up with nephrology for routine HD TThSa  Discharge Condition:stable CODE STATUS:  Code Status: Full Code  Regular healthy diet  Brief/Interim Summary: Pt presented with R 4th digit wound dehiscence at previous amputation site. His main complaint was pain. Denied fever, swelling, bleeding. He was evaluated by vascular surgery who recommended further amputation to aid in wound healing as the current wound is necrotic and will need to be debrided. His surgery was scheduled 4 days out so patient is to be discharged home and will be set up to complete the procedure as an outpatient on 12/13. Vascular surgery team to arrange and inform patient of the logistics. He is being discharged with augmentin and doxycycline until after surgery. He has remained stable and afebrile while admitted and will be sent home in stable condition.  His other chronic conditions were managed with his home medications.   Discharge Diagnoses:  Principal Problem:   Infected wound Active Problems:   Diabetes mellitus, type II (HCC)   Chronic diastolic heart failure (HCC)   Finger infection, fourth left   AF (paroxysmal atrial fibrillation) (HCC)   Anemia due to chronic kidney disease, on chronic dialysis ([place])   Wound dehiscence, surgical, initial encounter   ESRD on hemodialysis ([place])    Allergies as of 01/30/2021       Reactions   Levofloxacin Swelling   Of face Other reaction(s): swelling   Promethazine Diarrhea, Other (See Comments)   Other reaction(s): Other (See Comments), Other (See  Comments) Other reaction(s): Muscle cramps Muscle cramps Cramping Other reaction(s): Muscle cramps Muscle cramps   Other    Other reaction(s): Unknown   Malt Other (See Comments)   Other reaction(s): cramping        Medication List     STOP taking these medications    apixaban 5 MG Tabs tablet Commonly known as: ELIQUIS       TAKE these medications    acetaminophen 325 MG tablet Commonly known as: TYLENOL Take 2 tablets (650 mg total) by mouth every 6 (six) hours as needed for mild pain (or Fever >/= 101).   amiodarone 200 MG tablet Commonly known as: Pacerone Take 1 tablet (200 mg total) by mouth daily. 1 twice daily for 2 weeks, then 1 daily   amoxicillin-clavulanate 500-125 MG tablet Commonly known as: Augmentin Take 1 tablet (500 mg total) by mouth daily for 7 days. Must be taken AFTER dialysis on dialysis days.   atorvastatin 80 MG tablet Commonly known as: LIPITOR TAKE 1 TABLET (80 MG TOTAL) BY MOUTH AT BEDTIME.   azelastine 0.1 % nasal spray Commonly known as: ASTELIN Place 2 sprays into both nostrils 2 (two) times daily.   calcium acetate 667 MG capsule Commonly known as: PHOSLO Take 3 capsules (2,001 mg total) by mouth 3 (three) times daily with meals.   carvedilol 25 MG tablet Commonly known as: COREG Take 1 tablet (25 mg total) by mouth 2 (two) times daily with a meal.   clopidogrel 75 MG tablet Commonly known as: PLAVIX Take 1 tablet (75 mg total) by mouth daily.   doxycycline 100 MG tablet Commonly known as: ADOXA  Take 1 tablet (100 mg total) by mouth 2 (two) times daily for 7 days.   FeroSul 325 (65 FE) MG tablet Generic drug: ferrous sulfate Take 325 mg by mouth daily.   fluticasone 50 MCG/ACT nasal spray Commonly known as: FLONASE Place 2 sprays into both nostrils daily.   HYDROcodone-acetaminophen 5-325 MG tablet Commonly known as: NORCO/VICODIN Take 2 tablets by mouth 2 (two) times daily.   lidocaine-prilocaine  cream Commonly known as: EMLA Apply 1 application topically Every Tuesday,Thursday,and Saturday with dialysis.   losartan 25 MG tablet Commonly known as: COZAAR Take 25 mg by mouth daily.   nitroGLYCERIN 0.4 MG SL tablet Commonly known as: NITROSTAT Place 1 tablet (0.4 mg total) under the tongue every 5 (five) minutes as needed for chest pain.   prednisoLONE acetate 1 % ophthalmic suspension Commonly known as: PRED FORTE Place 1 drop into the left eye every 4 (four) hours.   tamsulosin 0.4 MG Caps capsule Commonly known as: FLOMAX Take 0.4 mg by mouth daily after breakfast.        Allergies  Allergen Reactions   Levofloxacin Swelling    Of face Other reaction(s): swelling   Promethazine Diarrhea and Other (See Comments)    Other reaction(s): Other (See Comments), Other (See Comments) Other reaction(s): Muscle cramps Muscle cramps Cramping Other reaction(s): Muscle cramps Muscle cramps   Other     Other reaction(s): Unknown   Malt Other (See Comments)    Other reaction(s): cramping    Consultations: Vascular surgery Nephrology   Procedures/Studies: DG Hand Complete Right  Result Date: 01/28/2021 CLINICAL DATA:  Pt comes into the ED via POV c/o infection in his right hand. 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 Sharp margins. No cortical erosion or destruction. There is no evidence of fracture or dislocation. There is no evidence of severe arthropathy or aggressive appearing focal bone abnormality. Soft tissues are unremarkable. Vascular calcifications. 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 Electronically Signed   By: Iven Finn M.D.   On: 01/28/2021 18:55   CT HEAD CODE STROKE WO CONTRAST  Result Date:  01/08/2021 CLINICAL DATA:  Code stroke.  Neuro deficit, acute, stroke suspected EXAM: CT HEAD WITHOUT CONTRAST TECHNIQUE: Contiguous axial images were obtained from the base of the skull through the vertex without intravenous contrast. COMPARISON:  December 08, 2020. FINDINGS: Brain: No evidence of acute infarction, hemorrhage, hydrocephalus, extra-axial collection or mass lesion/mass effect. Vascular: No hyperdense vessel identified. Intracranial and extracranial calcific atherosclerosis. Skull: No acute fracture. Chronic thickening of the skin along the occiput/upper neck. Sinuses/Orbits: Moderate paranasal sinus mucosal thickening Other: No acute abnormality. ASPECTS [facility] Stroke Program Early CT Score) total score (0-10 with 10 being normal): 10. IMPRESSION: No evidence of acute abnormality on this mildly motion limited exam. ASPECTS is 10. Code stroke imaging results were communicated on 01/08/2021 at 1:57 pm to provider Dr. Leonel Ramsay via secure text paging. Electronically Signed   By: Margaretha Sheffield M.D.   On: 01/08/2021 13:59    Subjective: Patient denies significant pain, swelling of hand. He is hungry and would like to go home.   Discharge Exam: Vitals:   01/30/21 0344 01/30/21 0801  BP: (!) 159/74 (!) 176/72  Pulse: 64 68  Resp: 16 17  Temp: 98.4 F (  36.9 C) 98.3 F (36.8 C)  SpO2: 99% 99%    General: Pt is alert, awake, not in acute distress Cardiovascular: RRR, S1/S2 +, no rubs, no gallops Respiratory: CTA bilaterally, no wheezing, no rhonchi Abdominal: Soft, NT, ND, bowel sounds + Extremities: R 4th digit amputation- please see clinical image for further detail.    Labs: Basic Metabolic Panel: Recent Labs  Lab 01/28/21 1831 01/29/21 0735 01/29/21 1152 01/30/21 0412  NA 138  --   --  133*  K 3.7  --   --  3.2*  CL 109  --   --  100  CO2 16*  --   --  22  GLUCOSE 160*  --   --  120*  BUN 66*  --   --  49*  CREATININE 12.06* 13.07*  --  9.15*  CALCIUM 7.8*   --   --  7.7*  PHOS  --   --  3.8  --    CBC: Recent Labs  Lab 01/28/21 1831 01/29/21 0735 01/30/21 0412  WBC 10.5 10.3 10.9*  NEUTROABS 8.1*  --   --   HGB 7.1* 7.7* 7.2*  HCT 23.7* 25.3* 23.8*  MCV 79.8* 78.6* 76.8*  PLT 248 240 242    Microbiology Recent Results (from the past 240 hour(s))  Body fluid culture w Gram Stain     Status: None (Preliminary result)   Collection Time: 01/29/21  1:06 AM   Specimen: Hand; Body Fluid  Result Value Ref Range Status   Specimen Description   Final    HAND LEFT Performed at [facility], [address] [place], [place] [address]    Special Requests   Final    NONE Performed at [facility], [place], [place] [address]    Gram Stain   Final    ABUNDANT WBC PRESENT, PREDOMINANTLY PMN MODERATE GRAM POSITIVE COCCI MODERATE GRAM NEGATIVE RODS RARE GRAM POSITIVE RODS Performed at [facility], [place] [address] [place], [place] [address]    Culture PENDING  Incomplete   Report Status PENDING  Incomplete  Resp Panel by RT-PCR (Flu A&B, Covid) Hand, Left     Status: None   Collection Time: 01/29/21  1:06 AM   Specimen: Hand, Left; Nasopharyngeal(NP) swabs in vial transport medium  Result Value Ref Range Status   SARS Coronavirus 2 by RT PCR NEGATIVE NEGATIVE Final    Comment: (NOTE) SARS-CoV-2 target nucleic acids are NOT DETECTED.  The SARS-CoV-2 RNA is generally detectable in upper respiratory specimens during the acute phase of infection. The lowest concentration of SARS-CoV-2 viral copies this assay can detect is 138 copies/mL. A negative result does not preclude SARS-Cov-2 infection and should not be used as the sole basis for treatment or other patient management decisions. A negative result may occur with  improper specimen collection/handling, submission of specimen other than nasopharyngeal swab, presence of viral mutation(s) within the areas targeted by this assay, and inadequate  number of viral copies(<138 copies/mL). A negative result must be combined with clinical observations, patient history, and epidemiological information. The expected result is Negative.  Fact Sheet for Patients:  EntrepreneurPulse.com.au  Fact Sheet for Healthcare Providers:  IncredibleEmployment.be  This test is no t yet approved or cleared by the [place] FDA and  has been authorized for detection and/or diagnosis of SARS-CoV-2 by FDA under an Emergency Use Authorization (EUA). This EUA will remain  in effect (meaning this test can be used) for the  duration of the COVID-19 declaration under Section 564(b)(1) of the Act, 21 U.S.C.section 360bbb-3(b)(1), unless the authorization is terminated  or revoked sooner.       Influenza A by PCR NEGATIVE NEGATIVE Final   Influenza B by PCR NEGATIVE NEGATIVE Final    Comment: (NOTE) The Xpert Xpress SARS-CoV-2/FLU/RSV plus assay is intended as an aid in the diagnosis of influenza from Nasopharyngeal swab specimens and should not be used as a sole basis for treatment. Nasal washings and aspirates are unacceptable for Xpert Xpress SARS-CoV-2/FLU/RSV testing.  Fact Sheet for Patients: EntrepreneurPulse.com.au  Fact Sheet for Healthcare Providers: IncredibleEmployment.be  This test is not yet approved or cleared by the [place] FDA and has been authorized for detection and/or diagnosis of SARS-CoV-2 by FDA under an Emergency Use Authorization (EUA). This EUA will remain in effect (meaning this test can be used) for the duration of the COVID-19 declaration under Section 564(b)(1) of the Act, 21 U.S.C. section 360bbb-3(b)(1), unless the authorization is terminated or revoked.  Performed at [facility], [address] [place], [place] [address]     Time coordinating discharge: Over 30 minutes  Richarda Osmond, MD  [facility] 01/30/2021, 10:33 AM Pager   If 7PM-7AM, please contact night-coverage [email or web address] Password [name]

## 2021-01-30 NOTE — Progress Notes (Signed)
Pharmacy Antibiotic Note  [name]. is a 50 y.o. male admitted on 01/29/2021 with Wound infection. Per nephro note, pt is on HD TTS.  Pharmacy has been consulted for Vanc and Zosyn  dosing.  Plan: Continue Zosyn 2.25 gm IV Q8H Change vancomycin to 1000 mg IV T-Th-Sat Follow up if changes to HD regimen while in patient Follow up cultures   Height: 6\' 3"  (190.5 cm) Weight: 93.2 kg (205 lb 7.5 oz) IBW/kg (Calculated) : 84.5  Temp (24hrs), Avg:98.4 F (36.9 C), Min:98.1 F (36.7 C), Max:99.1 F (37.3 C)  Recent Labs  Lab 01/28/21 1831 01/29/21 0735 01/30/21 0412  WBC 10.5 10.3 10.9*  CREATININE 12.06* 13.07* 9.15*  LATICACIDVEN 1.5  --   --      Estimated Creatinine Clearance: 11.3 mL/min (A) (by C-G formula based on SCr of 9.15 mg/dL (H)).    Allergies  Allergen Reactions   Levofloxacin Swelling    Of face Other reaction(s): swelling   Promethazine Diarrhea and Other (See Comments)    Other reaction(s): Other (See Comments), Other (See Comments) Other reaction(s): Muscle cramps Muscle cramps Cramping Other reaction(s): Muscle cramps Muscle cramps   Other     Other reaction(s): Unknown   Malt Other (See Comments)    Other reaction(s): cramping    Antimicrobials this admission: 12/8 Zosyn >> 12/8 Vanc >>   Dose adjustments this admission: 12/9 Vanc Tu-Thu >> Tu-Thu-Sat  Microbiology results: 12/8 Body fluid Cx (hand): GPR and GNR   Thank you for allowing pharmacy to be a part of this patient's care.  Sherilyn Banker, PharmD, BCPS Clinical Pharmacist   01/30/2021 7:14 AM

## 2021-01-30 NOTE — Progress Notes (Signed)
Initial Nutrition Assessment  DOCUMENTATION CODES:   Not applicable  INTERVENTION:   -30 ml Prosource Plus TID, each supplement provides 100 kcals and 15 grams protein -Renal MVI daily -Double protein portions at each meals  NUTRITION DIAGNOSIS:   Increased nutrient needs related to wound healing as evidenced by estimated needs.  GOAL:   Patient will meet greater than or equal to 90% of their needs  MONITOR:   PO intake, Supplement acceptance, Diet advancement, Labs, Weight trends, Skin, I & O's  REASON FOR ASSESSMENT:   Consult Assessment of nutrition requirement/status  ASSESSMENT:   [name]. is a 50 y.o. male with medical history significant for DM, diet controlled, HTN, ESRD on HD twice weekly per patient choice, diastolic CHF, CAD s/p STEMI 02/2020, A. fib on Eliquis, with recurrent RVR requiring electrical cardioversion, anemia of CKD, recurrent postdialysis syncopal episodes, prolonged QTC, PVD s/p right finger amputation, presents to the ED with a 2-day history of purulent drainage from the wound associated with sharp throbbing pain.  He denies fever or chills.  Pt admitted with wound dehiscence of amputation of rt fourth finger with infection.   Reviewed I/O's: -979 ml x 24 hours and -531 ml since admission  Spoke with pt at bedside, who reports feeling better today. He reports he consumed some food today, but did not like what he was served. He usually consumes 2-3 meals per day, which consists of a meat, starch, and vegetable.   Per pt, he denies any weight loss. EDW is 93 kg. Pt reports weight usually goes "up and down". Pt reports that he is a "bad dialysis pt"; when asked to elaborate, he shares that he does not tolerate treatments well (BP problems or a-fib) and it takes him awhile to recover. He likes Prosurce supplements.   Discussed importance of good meal and supplement intake to promote healing.    Medications reviewed and include phoslo and  ferrous sulfate.   Labs reviewed: Na: 133, K: 3.2, CBGS: 79-171.   NUTRITION - FOCUSED PHYSICAL EXAM:  Flowsheet Row Most Recent Value  Orbital Region No depletion  Upper Arm Region No depletion  Thoracic and Lumbar Region No depletion  Buccal Region No depletion  Temple Region No depletion  Clavicle Bone Region No depletion  Clavicle and Acromion Bone Region No depletion  Scapular Bone Region No depletion  Dorsal Hand No depletion  Patellar Region No depletion  Anterior Thigh Region No depletion  Posterior Calf Region No depletion  Edema (RD Assessment) None  Hair Reviewed  Eyes Reviewed  Mouth Reviewed  Skin Reviewed  Nails Reviewed       Diet Order:   Diet Order             Diet regular Room service appropriate? Yes; Fluid consistency: Thin; Fluid restriction: 1200 mL Fluid  Diet effective now                   EDUCATION NEEDS:   Education needs have been addressed  Skin:  Skin Assessment: Skin Integrity Issues: Skin Integrity Issues:: Other (Comment) Other: non pressure wound to rt finger  Last BM:  01/30/21  Height:   Ht Readings from Last 1 Encounters:  01/28/21 6\' 3"  (1.905 m)    Weight:   Wt Readings from Last 1 Encounters:  01/29/21 93.2 kg    Ideal Body Weight:  89.1 kg  BMI:  Body mass index is 25.68 kg/m.  Estimated Nutritional Needs:   Kcal:  5621-3086  Protein:  125-140 grams  Fluid:  1000 ml + UOP    Loistine Chance, RD, LDN, [place] Registered Dietitian II Certified Diabetes Care and Education Specialist Please refer to [facility] for RD and/or RD on-call/weekend/after hours pager

## 2021-01-31 LAB — BODY FLUID CULTURE W GRAM STAIN

## 2021-02-03 ENCOUNTER — Ambulatory Visit (HOSPITAL_BASED_OUTPATIENT_CLINIC_OR_DEPARTMENT_OTHER): Payer: Medicare Other | Admitting: Family

## 2021-02-03 ENCOUNTER — Encounter
Admission: AD | Disposition: A | Discharge status: Other Acute Inpatient Hospital | Payer: Self-pay | Source: Other Acute Inpatient Hospital | Attending: Family Medicine

## 2021-02-03 SURGERY — UPPER EXTREMITY ANGIOGRAPHY
Anesthesia: Moderate Sedation | Laterality: Right

## 2021-02-04 ENCOUNTER — Emergency Department (HOSPITAL_COMMUNITY): Payer: Medicare Other

## 2021-02-04 ENCOUNTER — Encounter (HOSPITAL_COMMUNITY): Payer: Self-pay | Admitting: *Deleted

## 2021-02-04 ENCOUNTER — Ambulatory Visit (INDEPENDENT_AMBULATORY_CARE_PROVIDER_SITE_OTHER): Payer: Medicare Other

## 2021-02-04 ENCOUNTER — Other Ambulatory Visit: Payer: Self-pay

## 2021-02-04 ENCOUNTER — Encounter (INDEPENDENT_AMBULATORY_CARE_PROVIDER_SITE_OTHER): Payer: Self-pay | Admitting: Nurse Practitioner

## 2021-02-04 ENCOUNTER — Emergency Department (HOSPITAL_COMMUNITY)
Admission: EM | Admit: 2021-02-04 | Discharge: 2021-02-04 | Disposition: A | Discharge status: Nursing Home (Includes ICF) | Payer: Medicare Other | Attending: Emergency Medicine | Admitting: Emergency Medicine

## 2021-02-04 ENCOUNTER — Ambulatory Visit (INDEPENDENT_AMBULATORY_CARE_PROVIDER_SITE_OTHER): Payer: Medicare Other | Admitting: Nurse Practitioner

## 2021-02-04 ENCOUNTER — Inpatient Hospital Stay
Admission: AD | Admit: 2021-02-04 | Discharge: 2021-02-08 | DRG: 252 | Disposition: A | Discharge status: Other Acute Inpatient Hospital | Payer: Medicare Other | Source: Other Acute Inpatient Hospital | Attending: Family Medicine | Admitting: Family Medicine

## 2021-02-04 VITALS — BP 143/73 | HR 66 | Resp 16 | Wt 201.6 lb

## 2021-02-04 DIAGNOSIS — L089 Local infection of the skin and subcutaneous tissue, unspecified: Secondary | ICD-10-CM | POA: Diagnosis present

## 2021-02-04 DIAGNOSIS — T148XXA Other injury of unspecified body region, initial encounter: Secondary | ICD-10-CM

## 2021-02-04 DIAGNOSIS — F339 Major depressive disorder, recurrent, unspecified: Secondary | ICD-10-CM | POA: Diagnosis present

## 2021-02-04 DIAGNOSIS — E538 Deficiency of other specified B group vitamins: Secondary | ICD-10-CM | POA: Diagnosis present

## 2021-02-04 DIAGNOSIS — E782 Mixed hyperlipidemia: Secondary | ICD-10-CM | POA: Diagnosis present

## 2021-02-04 DIAGNOSIS — T8781 Dehiscence of amputation stump: Secondary | ICD-10-CM | POA: Diagnosis present

## 2021-02-04 DIAGNOSIS — E440 Moderate protein-calorie malnutrition: Secondary | ICD-10-CM | POA: Diagnosis not present

## 2021-02-04 DIAGNOSIS — Z7902 Long term (current) use of antithrombotics/antiplatelets: Secondary | ICD-10-CM

## 2021-02-04 DIAGNOSIS — I252 Old myocardial infarction: Secondary | ICD-10-CM

## 2021-02-04 DIAGNOSIS — Z7901 Long term (current) use of anticoagulants: Secondary | ICD-10-CM

## 2021-02-04 DIAGNOSIS — Z992 Dependence on renal dialysis: Secondary | ICD-10-CM

## 2021-02-04 DIAGNOSIS — D509 Iron deficiency anemia, unspecified: Secondary | ICD-10-CM | POA: Diagnosis present

## 2021-02-04 DIAGNOSIS — D649 Anemia, unspecified: Secondary | ICD-10-CM

## 2021-02-04 DIAGNOSIS — I129 Hypertensive chronic kidney disease with stage 1 through stage 4 chronic kidney disease, or unspecified chronic kidney disease: Secondary | ICD-10-CM | POA: Diagnosis present

## 2021-02-04 DIAGNOSIS — M86141 Other acute osteomyelitis, right hand: Secondary | ICD-10-CM | POA: Diagnosis present

## 2021-02-04 DIAGNOSIS — N186 End stage renal disease: Secondary | ICD-10-CM

## 2021-02-04 DIAGNOSIS — T82898A Other specified complication of vascular prosthetic devices, implants and grafts, initial encounter: Principal | ICD-10-CM | POA: Diagnosis present

## 2021-02-04 DIAGNOSIS — E870 Hyperosmolality and hypernatremia: Secondary | ICD-10-CM | POA: Diagnosis not present

## 2021-02-04 DIAGNOSIS — E1165 Type 2 diabetes mellitus with hyperglycemia: Secondary | ICD-10-CM

## 2021-02-04 DIAGNOSIS — Z79899 Other long term (current) drug therapy: Secondary | ICD-10-CM | POA: Insufficient documentation

## 2021-02-04 DIAGNOSIS — K219 Gastro-esophageal reflux disease without esophagitis: Secondary | ICD-10-CM | POA: Diagnosis present

## 2021-02-04 DIAGNOSIS — M86641 Other chronic osteomyelitis, right hand: Secondary | ICD-10-CM | POA: Diagnosis present

## 2021-02-04 DIAGNOSIS — Z9115 Patient's noncompliance with renal dialysis: Secondary | ICD-10-CM

## 2021-02-04 DIAGNOSIS — Z794 Long term (current) use of insulin: Secondary | ICD-10-CM

## 2021-02-04 DIAGNOSIS — I5032 Chronic diastolic (congestive) heart failure: Secondary | ICD-10-CM | POA: Diagnosis not present

## 2021-02-04 DIAGNOSIS — I10 Essential (primary) hypertension: Secondary | ICD-10-CM

## 2021-02-04 DIAGNOSIS — H5440 Blindness, one eye, unspecified eye: Secondary | ICD-10-CM

## 2021-02-04 DIAGNOSIS — I5033 Acute on chronic diastolic (congestive) heart failure: Secondary | ICD-10-CM | POA: Insufficient documentation

## 2021-02-04 DIAGNOSIS — Z951 Presence of aortocoronary bypass graft: Secondary | ICD-10-CM | POA: Diagnosis not present

## 2021-02-04 DIAGNOSIS — M869 Osteomyelitis, unspecified: Secondary | ICD-10-CM

## 2021-02-04 DIAGNOSIS — E1122 Type 2 diabetes mellitus with diabetic chronic kidney disease: Secondary | ICD-10-CM | POA: Diagnosis not present

## 2021-02-04 DIAGNOSIS — Y838 Other surgical procedures as the cause of abnormal reaction of the patient, or of later complication, without mention of misadventure at the time of the procedure: Secondary | ICD-10-CM | POA: Diagnosis present

## 2021-02-04 DIAGNOSIS — Y832 Surgical operation with anastomosis, bypass or graft as the cause of abnormal reaction of the patient, or of later complication, without mention of misadventure at the time of the procedure: Secondary | ICD-10-CM | POA: Diagnosis not present

## 2021-02-04 DIAGNOSIS — Z818 Family history of other mental and behavioral disorders: Secondary | ICD-10-CM

## 2021-02-04 DIAGNOSIS — Z20822 Contact with and (suspected) exposure to covid-19: Secondary | ICD-10-CM | POA: Diagnosis not present

## 2021-02-04 DIAGNOSIS — E1169 Type 2 diabetes mellitus with other specified complication: Secondary | ICD-10-CM | POA: Diagnosis present

## 2021-02-04 DIAGNOSIS — E875 Hyperkalemia: Secondary | ICD-10-CM | POA: Diagnosis present

## 2021-02-04 DIAGNOSIS — Z841 Family history of disorders of kidney and ureter: Secondary | ICD-10-CM

## 2021-02-04 DIAGNOSIS — Z89021 Acquired absence of right finger(s): Secondary | ICD-10-CM | POA: Insufficient documentation

## 2021-02-04 DIAGNOSIS — Z8249 Family history of ischemic heart disease and other diseases of the circulatory system: Secondary | ICD-10-CM

## 2021-02-04 DIAGNOSIS — I2510 Atherosclerotic heart disease of native coronary artery without angina pectoris: Secondary | ICD-10-CM | POA: Diagnosis present

## 2021-02-04 DIAGNOSIS — Z87442 Personal history of urinary calculi: Secondary | ICD-10-CM

## 2021-02-04 DIAGNOSIS — Z881 Allergy status to other antibiotic agents status: Secondary | ICD-10-CM

## 2021-02-04 DIAGNOSIS — I480 Paroxysmal atrial fibrillation: Secondary | ICD-10-CM | POA: Diagnosis not present

## 2021-02-04 DIAGNOSIS — I132 Hypertensive heart and chronic kidney disease with heart failure and with stage 5 chronic kidney disease, or end stage renal disease: Secondary | ICD-10-CM | POA: Diagnosis present

## 2021-02-04 DIAGNOSIS — E1152 Type 2 diabetes mellitus with diabetic peripheral angiopathy with gangrene: Secondary | ICD-10-CM | POA: Diagnosis not present

## 2021-02-04 DIAGNOSIS — F329 Major depressive disorder, single episode, unspecified: Secondary | ICD-10-CM | POA: Diagnosis present

## 2021-02-04 DIAGNOSIS — I96 Gangrene, not elsewhere classified: Secondary | ICD-10-CM | POA: Diagnosis present

## 2021-02-04 DIAGNOSIS — H5462 Unqualified visual loss, left eye, normal vision right eye: Secondary | ICD-10-CM | POA: Diagnosis present

## 2021-02-04 DIAGNOSIS — Z89022 Acquired absence of left finger(s): Secondary | ICD-10-CM

## 2021-02-04 DIAGNOSIS — Z888 Allergy status to other drugs, medicaments and biological substances status: Secondary | ICD-10-CM

## 2021-02-04 DIAGNOSIS — B964 Proteus (mirabilis) (morganii) as the cause of diseases classified elsewhere: Secondary | ICD-10-CM | POA: Diagnosis present

## 2021-02-04 DIAGNOSIS — D631 Anemia in chronic kidney disease: Secondary | ICD-10-CM | POA: Diagnosis not present

## 2021-02-04 DIAGNOSIS — N2581 Secondary hyperparathyroidism of renal origin: Secondary | ICD-10-CM | POA: Diagnosis not present

## 2021-02-04 DIAGNOSIS — Z823 Family history of stroke: Secondary | ICD-10-CM

## 2021-02-04 DIAGNOSIS — K3184 Gastroparesis: Secondary | ICD-10-CM | POA: Diagnosis not present

## 2021-02-04 DIAGNOSIS — T8131XA Disruption of external operation (surgical) wound, not elsewhere classified, initial encounter: Secondary | ICD-10-CM | POA: Diagnosis present

## 2021-02-04 DIAGNOSIS — E872 Acidosis, unspecified: Secondary | ICD-10-CM | POA: Diagnosis present

## 2021-02-04 DIAGNOSIS — E1143 Type 2 diabetes mellitus with diabetic autonomic (poly)neuropathy: Secondary | ICD-10-CM | POA: Diagnosis present

## 2021-02-04 DIAGNOSIS — Z6825 Body mass index (BMI) 25.0-25.9, adult: Secondary | ICD-10-CM

## 2021-02-04 DIAGNOSIS — G458 Other transient cerebral ischemic attacks and related syndromes: Secondary | ICD-10-CM

## 2021-02-04 DIAGNOSIS — Z833 Family history of diabetes mellitus: Secondary | ICD-10-CM

## 2021-02-04 HISTORY — DX: Dependence on renal dialysis: Z99.2

## 2021-02-04 LAB — CBC WITH DIFFERENTIAL/PLATELET
Abs Immature Granulocytes: 0.05 10*3/uL (ref 0.00–0.07)
Basophils Absolute: 0.1 10*3/uL (ref 0.0–0.1)
Basophils Relative: 1 %
Eosinophils Absolute: 0.4 10*3/uL (ref 0.0–0.5)
Eosinophils Relative: 4 %
HCT: 25.5 % — ABNORMAL LOW (ref 39.0–52.0)
Hemoglobin: 7.4 g/dL — ABNORMAL LOW (ref 13.0–17.0)
Immature Granulocytes: 1 %
Lymphocytes Relative: 13 %
Lymphs Abs: 1.4 10*3/uL (ref 0.7–4.0)
MCH: 23.5 pg — ABNORMAL LOW (ref 26.0–34.0)
MCHC: 29 g/dL — ABNORMAL LOW (ref 30.0–36.0)
MCV: 81 fL (ref 80.0–100.0)
Monocytes Absolute: 1.4 10*3/uL — ABNORMAL HIGH (ref 0.1–1.0)
Monocytes Relative: 13 %
Neutro Abs: 7.5 10*3/uL (ref 1.7–7.7)
Neutrophils Relative %: 68 %
Platelets: 270 10*3/uL (ref 150–400)
RBC: 3.15 MIL/uL — ABNORMAL LOW (ref 4.22–5.81)
RDW: 20 % — ABNORMAL HIGH (ref 11.5–15.5)
WBC: 10.8 10*3/uL — ABNORMAL HIGH (ref 4.0–10.5)
nRBC: 0 % (ref 0.0–0.2)

## 2021-02-04 LAB — COMPREHENSIVE METABOLIC PANEL
ALT: 14 U/L (ref 0–44)
AST: 11 U/L — ABNORMAL LOW (ref 15–41)
Albumin: 3 g/dL — ABNORMAL LOW (ref 3.5–5.0)
Alkaline Phosphatase: 64 U/L (ref 38–126)
Anion gap: 18 — ABNORMAL HIGH (ref 5–15)
BUN: 79 mg/dL — ABNORMAL HIGH (ref 6–20)
CO2: 10 mmol/L — ABNORMAL LOW (ref 22–32)
Calcium: 8.5 mg/dL — ABNORMAL LOW (ref 8.9–10.3)
Chloride: 121 mmol/L — ABNORMAL HIGH (ref 98–111)
Creatinine, Ser: 17.99 mg/dL — ABNORMAL HIGH (ref 0.61–1.24)
GFR, Estimated: 3 mL/min — ABNORMAL LOW (ref >60)
Glucose, Bld: 70 mg/dL (ref 70–99)
Potassium: 5.3 mmol/L — ABNORMAL HIGH (ref 3.5–5.1)
Sodium: 149 mmol/L — ABNORMAL HIGH (ref 135–145)
Total Bilirubin: 0.2 mg/dL — ABNORMAL LOW (ref 0.3–1.2)
Total Protein: 7 g/dL (ref 6.5–8.1)

## 2021-02-04 LAB — RESP PANEL BY RT-PCR (FLU A&B, COVID) ARPGX2
Influenza A by PCR: NEGATIVE
Influenza B by PCR: NEGATIVE
SARS Coronavirus 2 by RT PCR: NEGATIVE

## 2021-02-04 LAB — LACTIC ACID, PLASMA: Lactic Acid, Venous: 0.6 mmol/L (ref 0.5–1.9)

## 2021-02-04 MED ORDER — CARVEDILOL 25 MG PO TABS
25.0000 mg | ORAL_TABLET | Freq: Two times a day (BID) | ORAL | Status: DC
  Administered 2021-02-06 – 2021-02-08 (×6): 25 mg via ORAL
  Filled 2021-02-04 (×6): qty 1

## 2021-02-04 MED ORDER — ACETAMINOPHEN 325 MG PO TABS
650.0000 mg | ORAL_TABLET | Freq: Four times a day (QID) | ORAL | Status: AC | PRN
  Administered 2021-02-05 – 2021-02-07 (×2): 650 mg via ORAL
  Filled 2021-02-04 (×2): qty 2

## 2021-02-04 MED ORDER — HYDROCODONE-ACETAMINOPHEN 5-325 MG PO TABS
1.0000 | ORAL_TABLET | Freq: Once | ORAL | Status: AC
  Administered 2021-02-04: 21:00:00 1 via ORAL
  Filled 2021-02-04: qty 1

## 2021-02-04 MED ORDER — HYDROCODONE-ACETAMINOPHEN 5-325 MG PO TABS
2.0000 | ORAL_TABLET | Freq: Four times a day (QID) | ORAL | Status: AC | PRN
  Administered 2021-02-05 – 2021-02-06 (×3): 2 via ORAL
  Filled 2021-02-04 (×3): qty 2

## 2021-02-04 MED ORDER — TAMSULOSIN HCL 0.4 MG PO CAPS: 0.4000 mg | ORAL_CAPSULE | Freq: Every day | ORAL | Status: DC

## 2021-02-04 MED ORDER — NITROGLYCERIN 0.4 MG SL SUBL: 0.4000 mg | SUBLINGUAL_TABLET | SUBLINGUAL | Status: DC | PRN

## 2021-02-04 MED ORDER — SODIUM CHLORIDE 0.9 % IV SOLN
2.0000 g | Freq: Once | INTRAVENOUS | Status: AC
  Administered 2021-02-04: 19:00:00 2 g via INTRAVENOUS
  Filled 2021-02-04: qty 2

## 2021-02-04 MED ORDER — ATORVASTATIN CALCIUM 20 MG PO TABS
80.0000 mg | ORAL_TABLET | Freq: Every day | ORAL | Status: DC
  Administered 2021-02-05 – 2021-02-07 (×3): 80 mg via ORAL
  Filled 2021-02-04 (×3): qty 4

## 2021-02-04 MED ORDER — ACETAMINOPHEN 650 MG RE SUPP: 650.0000 mg | Freq: Four times a day (QID) | RECTAL | Status: AC | PRN

## 2021-02-04 MED ORDER — HEPARIN SODIUM (PORCINE) 5000 UNIT/ML IJ SOLN
5000.0000 [IU] | Freq: Three times a day (TID) | INTRAMUSCULAR | Status: AC
  Administered 2021-02-05: 5000 [IU] via SUBCUTANEOUS
  Filled 2021-02-04: qty 1

## 2021-02-04 MED ORDER — VANCOMYCIN HCL 1500 MG/300ML IV SOLN
1500.0000 mg | Freq: Once | INTRAVENOUS | Status: AC
  Administered 2021-02-04: 20:00:00 1500 mg via INTRAVENOUS
  Filled 2021-02-04: qty 300

## 2021-02-04 NOTE — ED Notes (Signed)
[facility] leaving [facility] at this time to transport to [facility].

## 2021-02-04 NOTE — Progress Notes (Signed)
Pharmacy Antibiotic Note  [name] Dick Hark. is a 50 y.o. male admitted on 02/04/2021 with  osteomyelitis .  Pharmacy has been consulted for vanc dosing.  Pt presented with hand wound with vascular procedure plan for 12/15 at [facility]. Pt had fevers and chills. Pt is on a TTS dialysis schedule. She got 1 dose of cefepime in the ED. Vanc is also ordered.   Plan: Vanc 1.5g IV x1  F/u with HD schedule for further vanc  Weight: 89.4 kg (197 lb)  Temp (24hrs), Avg:97.9 F (36.6 C), Min:97.9 F (36.6 C), Max:97.9 F (36.6 C)  Recent Labs  Lab 01/29/21 0735 01/30/21 0412 02/04/21 1628  WBC 10.3 10.9* 10.8*  CREATININE 13.07* 9.15* 17.99*  LATICACIDVEN  --   --  0.6    Estimated Creatinine Clearance: 5.7 mL/min (A) (by C-G formula based on SCr of 17.99 mg/dL (H)).    Allergies  Allergen Reactions   Levofloxacin Swelling    Of face Other reaction(s): swelling   Promethazine Diarrhea and Other (See Comments)    Other reaction(s): Other (See Comments), Other (See Comments) Other reaction(s): Muscle cramps Muscle cramps Cramping Other reaction(s): Muscle cramps Muscle cramps   Other     Other reaction(s): Unknown   Malt Other (See Comments)    Other reaction(s): cramping    Antimicrobials this admission: 12/14 vanc>> 12/14 cefepime x1  Dose adjustments this admission:   Microbiology results: 12/14 blood>>  Onnie Boer, PharmD, [place], AAHIVP, CPP Infectious Disease Pharmacist 02/04/2021 7:34 PM

## 2021-02-04 NOTE — ED Triage Notes (Signed)
Pt here for wound infection to right hand.  Had surgery on 10/22.  Reported that pt with fever and chills at home.  [facility] did not note fever en route.

## 2021-02-04 NOTE — H&P (Signed)
History and Physical   [name]. NKN:[id number] DOB: 07-10-1970 DOA: 02/04/2021  PCP: [name], MD  Outpatient Specialists: Dr. Earnie Larsson, [facility] Patient coming from: [place] have personally briefly reviewed patient's old medical records in [place].  Chief Concern: Finger infection and odor  HPI: [name]. is a 50 y.o. male with medical history significant for end-stage renal disease on hemodialysis Tuesdays and Thursday, hypertension, history of insulin-dependent diabetes mellitus, currently non-insulin-dependent, hypertension, hyperlipidemia, gastroparesis, CAD, who presents to the emergency department at [facility] at the advice of orthopedic service.  He reports that he had preop visit for orthopedic amputation of his right fourth digit.  He reports that he had it amputated in October however it has been nonhealing since with increased odor and purulence discharge in the last few weeks.  He denies subjective fever.  He does endorse chills in the last couple of days.  He denies nausea, vomiting, shortness of breath, chest pain, abdominal pain.  He is currently an uric and does not make any urine.  He has chronic watery diarrhea after eating persistently, he states this is normal for him due to history of GI surgery from gastroparesis.  He states that after eating any meals, in approximately 15 to 20 minutes he will need to use the restroom for diarrhea/watery bowel movements.  Social history: He lives at home with his wife.  He denies tobacco, EtOH, recreational drug use. 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  Vaccination history: He endorses being vaccinated for COVID-19 with at least 4 doses.  ROS: Constitutional: no weight change, no fever ENT/Mouth: no sore throat, no rhinorrhea Eyes: no eye pain, no vision changes Cardiovascular: no chest pain, no dyspnea,  no edema, no palpitations Respiratory: no cough, no sputum, no  wheezing Gastrointestinal: no nausea, no vomiting, + diarrhea, no constipation Genitourinary: no urinary incontinence, no dysuria, no hematuria Musculoskeletal: no arthralgias, no myalgias Skin: + skin lesions, no pruritus, + wound at finger with + odor and purulence Neuro: no weakness, no loss of consciousness, no syncope Psych: no anxiety, no depression, no decrease appetite Heme/Lymph: no bruising, no bleeding  ED Course: Discussed with emergency medicine provider at [facility], patient requiring hospitalization for chief concerns of right fourth digit wound infection in setting of recent amputation in October 2022.  Vitals in the emergency department at [facility] showed temperature of 98.4, respiration rate of 18, heart rate of 76, initial blood pressure was 183/80, SPO2 was 99% on room air.  Labs at [facility] showed serum sodium of 149, potassium 5.3, chloride 121, bicarb of 10, nonfasting blood glucose of 70, BUN of 79, serum creatinine of 17.99, GFR 3.  WBC was elevated at 10.8, hemoglobin 7.4, platelets of 270.  Blood cultures x2 were ordered at [facility].  Right hand x-ray was ordered and was read as new destructive changes involving the base of the ring finger at the proximal phalanx compatible with acute osteomyelitis.  Assessment/Plan  Principal Problem:   Wound infection Active Problems:   Mixed hyperlipidemia   Essential hypertension, benign   Hypertensive chronic kidney disease with stage 1 through stage 4 chronic kidney disease, or unspecified chronic kidney disease   Iron deficiency anemia, unspecified   Major depressive disorder, recurrent, unspecified (HCC)   Metabolic acidosis   Moderate protein-calorie malnutrition (HCC)   Secondary hyperparathyroidism of renal origin ([place])   Reactive depression   Gangrene of finger of right hand ([place])  Wound dehiscence, surgical, initial encounter   ESRD on hemodialysis (HCC)   Hyperkalemia   Hyperphosphatemia    Hypermagnesemia   # Right fourth digit wound dehiscence with osteomyelitis on x-ray - Get baseline sed rate and CRP - Vancomycin and cefepime per pharmacy - Check MRSA PCR - Follow-up on blood cultures - Pain control with Norco and morphine 0.5 mg IV - Dr. Lucky Cowboy has been consulted and patient is on the list to be seen, Dr. Lucky Cowboy asked for patient to be n.p.o. after midnight - N.p.o. after midnight except for sips with meds ordered  # End-stage renal disease on hemodialysis Tuesdays and Thursday - Patient's last hemodialysis session was 01/29/2021 - Patient missed Tuesday dialysis session due to preop visits - Nephrology consultation placed in epic - A.m. team to reach out to nephrology for dialysis list  # Hyperphosphatemia # Hypermagnesemia # Hyperkalemia-presumed secondary to end-stage renal disease and missing 1 dialysis session - Nephrology has been consulted via epic  # Hyperlipidemia-atorvastatin 80 mg nightly resumed  # CAD-resumed atorvastatin, Coreg 25 mg p.o. 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 team to initiate pharmacologic DVT prophylaxis when appropriate  Chart reviewed.   Hospitalization from 01/29/2021 to 01/30/2021: Per discharge summary, patient presented for right fourth digit wound dehiscence at previous amputation site.  Patient did not have fever, swelling, bleeding.  Vascular was consulted and recommended further amputation to aid with wound healing as the wound is necrotic and will need debridement.  Surgery was scheduled for days out so patient is to be discharged home and will be set up to complete procedure as an outpatient on 02/03/2021.  He was  discharged home with Augmentin and doxycycline until after surgery.  DVT prophylaxis: Heparin 5000 units subcutaneous, 1 dose ordered Code Status: Full code Diet: Renal diet with fluid restriction currently; n.p.o. after midnight except for sips with meds Family Communication: No Disposition Plan: Pending angiogram, orthopedic amputation Consults called: Vascular, Dr. Lucky Cowboy Admission status: Telemetry medical, observation  Past Medical History:  Diagnosis Date   Anxiety    Cellulitis    CHF (congestive heart failure) (HCC)    Chronic diarrhea    Chronic kidney disease    Chronic pain of both ankles    Diabetes mellitus without complication (HCC)    Diabetes mellitus, type II (HCC)    Diabetic neuropathy (HCC)    Frequent falls    Gait instability    GERD (gastroesophageal reflux disease)    Heart palpitations    Hemodialysis patient ([place])    History of kidney stones    HLD (hyperlipidemia)    Hypertension    Insomnia    Nausea and vomiting in adult    recurrent   Past Surgical History:  Procedure Laterality Date   AMPUTATION Left 03/16/2016   Procedure: AMPUTATION DIGIT LEFT HALLUX;  Surgeon: Trula Slade, DPM;  Location: [place];  Service: Podiatry;  Laterality: Left;  can start around 5    ARTHROSCOPIC REPAIR ACL Left    AV FISTULA PLACEMENT Right 05/31/2019   Procedure: Brachiocephalic AV fistula creation;  Surgeon: Algernon Huxley, MD;  Location: [name] ORS;  Service: Vascular;  Laterality: Right;   COLONOSCOPY WITH PROPOFOL N/A  10/28/2015   Procedure: COLONOSCOPY WITH PROPOFOL;  Surgeon: Lollie Sails, MD;  Location: [facility] ENDOSCOPY;  Service: Endoscopy;  Laterality: N/A;   COLONOSCOPY WITH PROPOFOL N/A 10/29/2015   Procedure: COLONOSCOPY WITH PROPOFOL;  Surgeon: Lollie Sails, MD;  Location: [facility] ENDOSCOPY;  Service: Endoscopy;  Laterality: N/A;   CORONARY/GRAFT ACUTE MI REVASCULARIZATION N/A 02/26/2020   Procedure: Coronary/Graft Acute MI Revascularization;  Surgeon:  Jettie Booze, MD;  Location: [place] CV LAB;  Service: Cardiovascular;  Laterality: N/A;   DIALYSIS/PERMA CATHETER INSERTION Right 04/26/2019   Perm Cath    DIALYSIS/PERMA CATHETER INSERTION N/A 04/26/2019   Procedure: DIALYSIS/PERMA CATHETER INSERTION;  Surgeon: Algernon Huxley, MD;  Location: [place] CV LAB;  Service: Cardiovascular;  Laterality: N/A;   DIALYSIS/PERMA CATHETER REMOVAL N/A 08/15/2019   Procedure: DIALYSIS/PERMA CATHETER REMOVAL;  Surgeon: Katha Cabal, MD;  Location: [place] CV LAB;  Service: Cardiovascular;  Laterality: N/A;   ESOPHAGOGASTRODUODENOSCOPY (EGD) WITH PROPOFOL N/A 12/27/2017   Procedure: ESOPHAGOGASTRODUODENOSCOPY (EGD) WITH PROPOFOL;  Surgeon: [place], Benay Pike, MD;  Location: [name] ENDOSCOPY;  Service: Gastroenterology;  Laterality: N/A;   INTRAVASCULAR ULTRASOUND/IVUS N/A 02/26/2020   Procedure: Intravascular Ultrasound/IVUS;  Surgeon: Jettie Booze, MD;  Location: [place] CV LAB;  Service: Cardiovascular;  Laterality: N/A;   LEFT HEART CATH AND CORONARY ANGIOGRAPHY N/A 02/26/2020   Procedure: LEFT HEART CATH AND CORONARY ANGIOGRAPHY;  Surgeon: Jettie Booze, MD;  Location: [place] CV LAB;  Service: Cardiovascular;  Laterality: N/A;   PROSTATE SURGERY  2016   TONSILECTOMY/ADENOIDECTOMY WITH MYRINGOTOMY     TONSILLECTOMY     UPPER EXTREMITY ANGIOGRAPHY Right 11/26/2020   Procedure: Upper Extremity Angiography;  Surgeon: Algernon Huxley, MD;  Location: [place] CV LAB;  Service: Cardiovascular;  Laterality: Right;   Social History:  reports that he has never smoked. He has never used smokeless tobacco. He reports that he does not drink alcohol and does not use drugs.  Allergies  Allergen Reactions   Levofloxacin Swelling    Of face Other reaction(s): swelling   Promethazine Diarrhea and Other (See Comments)    Other reaction(s): Other (See Comments), Other (See Comments) Other reaction(s): Muscle cramps Muscle  cramps Cramping Other reaction(s): Muscle cramps Muscle cramps   Other     Other reaction(s): Unknown   Malt Other (See Comments)    Other reaction(s): cramping   Family History  Problem Relation Age of Onset   CAD Father    Stroke Father    Diabetes Mellitus II Mother    Kidney failure Mother    Schizophrenia Mother    Family history: Family history reviewed and pertinent renal failure in mother.  Prior to Admission medications   Medication Sig Start Date End Date Taking? Authorizing Provider  acetaminophen (TYLENOL) 325 MG tablet Take 2 tablets (650 mg total) by mouth every 6 (six) hours as needed for mild pain (or Fever >/= 101). 01/30/21   Richarda Osmond, MD  amiodarone (PACERONE) 200 MG tablet Take 1 tablet (200 mg total) by mouth daily. 1 twice daily for 2 weeks, then 1 daily 01/09/21   Eugenie Filler, MD  amoxicillin-clavulanate (AUGMENTIN) 500-125 MG tablet Take 1 tablet (500 mg total) by mouth daily for 7 days. Must be taken AFTER dialysis on dialysis days. Patient not taking: Reported on 02/04/2021 01/30/21 02/06/21  Richarda Osmond, MD  atorvastatin (LIPITOR) 80 MG tablet TAKE 1 TABLET (80 MG TOTAL) BY MOUTH AT BEDTIME. 02/28/20 02/27/21  Reino Bellis  B, NP  azelastine (ASTELIN) 0.1 % nasal spray Place 2 sprays into both nostrils 2 (two) times daily. 09/08/20   [provider]  calcium acetate (PHOSLO) 667 MG capsule Take 3 capsules (2,001 mg total) by mouth 3 (three) times daily with meals. 11/27/20   Sharen Hones, MD  carvedilol (COREG) 25 MG tablet Take 1 tablet (25 mg total) by mouth 2 (two) times daily with a meal. 12/09/20   Johnson, Clanford L, MD  clopidogrel (PLAVIX) 75 MG tablet Take 1 tablet (75 mg total) by mouth daily. 12/09/20   Johnson, Clanford L, MD  doxycycline (ADOXA) 100 MG tablet Take 1 tablet (100 mg total) by mouth 2 (two) times daily for 7 days. 01/30/21 02/06/21  Richarda Osmond, MD  FEROSUL 325 (65 Fe) MG tablet Take 325 mg by  mouth daily. 09/08/20   [provider]  fluticasone (FLONASE) 50 MCG/ACT nasal spray Place 2 sprays into both nostrils daily. Patient not taking: Reported on 02/04/2021 09/08/20   [provider]  HYDROcodone-acetaminophen (NORCO/VICODIN) 5-325 MG tablet Take 2 tablets by mouth 2 (two) times daily. 01/23/21   [provider]  lidocaine-prilocaine (EMLA) cream Apply 1 application topically Every Tuesday,Thursday,and Saturday with dialysis.  Patient not taking: Reported on 02/04/2021 08/15/19   [provider]  losartan (COZAAR) 25 MG tablet Take 25 mg by mouth daily. Patient not taking: Reported on 02/04/2021 01/21/21   [provider]  nitroGLYCERIN (NITROSTAT) 0.4 MG SL tablet Place 1 tablet (0.4 mg total) under the tongue every 5 (five) minutes as needed for chest pain. 12/09/20 12/09/21  Johnson, Clanford L, MD  prednisoLONE acetate (PRED FORTE) 1 % ophthalmic suspension Place 1 drop into the left eye every 4 (four) hours.    [provider]  tamsulosin (FLOMAX) 0.4 MG CAPS capsule Take 0.4 mg by mouth daily after breakfast.     [provider]   Physical Exam: Vitals:   02/05/21 0111  BP: (!) 156/75  Pulse: 66  Resp: 15  Temp: 98.8 F (37.1 C)  TempSrc: Oral  SpO2: 100%   Constitutional: appears age-appropriate, NAD, calm, comfortable Eyes: PERRL of the right eye, left eye blindness, right lids and conjunctivae normal ENMT: Mucous membranes are moist. Posterior pharynx clear of any exudate or lesions. Age-appropriate dentition. Hearing appropriate Neck: normal, supple, no masses, no thyromegaly Respiratory: clear to auscultation bilaterally, no wheezing, no crackles. Normal respiratory effort. No accessory muscle use.  Cardiovascular: Regular rate and rhythm, no murmurs / rubs / gallops. No extremity edema. 2+ pedal pulses. No carotid bruits.  Abdomen: no tenderness, no masses palpated, no hepatosplenomegaly. Bowel sounds  positive.  Musculoskeletal: no clubbing / cyanosis. No joint deformity upper and lower extremities. Good ROM, no contractures, no atrophy. Normal muscle tone.  Skin: Right fourth digit wound with bandages in place Neurologic: Sensation intact. Strength 5/5 in all 4.  Psychiatric: Normal judgment and insight. Alert and oriented x 3. Normal mood.   EKG: Sinus rhythm with rate of 67, QTc 448, LVH  Right hand x-ray on Admission: I personally reviewed and I agree with radiologist reading as below.  DG Hand Complete Right  Result Date: 02/04/2021 CLINICAL DATA:  Right hand wound, fever.  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 New destructive changes involving the base of the ring finger proximal phalanx compatible with acute osteomyelitis. No fracture  or dislocation. Osseous structures are demineralized. Severe small vessel atherosclerotic calcifications. Prominent soft tissue swelling of the hand. IMPRESSION: New destructive changes involving the base of the ring finger proximal phalanx compatible with acute osteomyelitis. Electronically Signed   By: Davina Poke D.O.   On: 02/04/2021 16:49    Labs on Admission: I have personally reviewed following labs CBC: Recent Labs  Lab 01/29/21 0735 01/30/21 0412 02/04/21 1628  WBC 10.3 10.9* 10.8*  NEUTROABS  --   --  7.5  HGB 7.7* 7.2* 7.4*  HCT 25.3* 23.8* 25.5*  MCV 78.6* 76.8* 81.0  PLT 240 242 195   Basic Metabolic Panel: Recent Labs  Lab 01/29/21 0735 01/29/21 1152 01/30/21 0412 02/04/21 1628 02/04/21 2340  NA  --   --  133* 149*  --   K  --   --  3.2* 5.3*  --   CL  --   --  100 121*  --   CO2  --   --  22 10*  --   GLUCOSE  --   --  120* 70  --   BUN  --   --  49* 79*  --   CREATININE 13.07*  --  9.15* 17.99*  --   CALCIUM  --   --  7.7* 8.5*  --   MG  --   --   --   --  2.5*  PHOS   --  3.8  --   --  11.5*   GFR: Estimated Creatinine Clearance: 5.7 mL/min (A) (by C-G formula based on SCr of 17.99 mg/dL (H)). Liver Function Tests: Recent Labs  Lab 02/04/21 1628  AST 11*  ALT 14  ALKPHOS 64  BILITOT 0.2*  PROT 7.0  ALBUMIN 3.0*   CBG: Recent Labs  Lab 01/29/21 0415 01/29/21 2157 01/30/21 0801 01/30/21 1209  GLUCAP 79 171* 80 81   Urine analysis:    Component Value Date/Time   COLORURINE YELLOW (A) 11/07/2017 1855   APPEARANCEUR CLOUDY (A) 11/07/2017 1855   LABSPEC 1.016 11/07/2017 1855   PHURINE 5.0 11/07/2017 1855   GLUCOSEU >=500 (A) 11/07/2017 1855   HGBUR MODERATE (A) 11/07/2017 1855   BILIRUBINUR NEGATIVE 11/07/2017 1855   KETONESUR NEGATIVE 11/07/2017 1855   PROTEINUR >=300 (A) 11/07/2017 1855   NITRITE NEGATIVE 11/07/2017 1855   LEUKOCYTESUR NEGATIVE 11/07/2017 1855   Dr. [name] Poet [facility]  If 7PM-7AM, please contact overnight-coverage provider If 7AM-7PM, please contact day coverage provider [email or web address]  02/05/2021, 2:05 AM

## 2021-02-04 NOTE — ED Provider Notes (Signed)
[facility] Provider Note   CSN: [id number] Arrival date & time: 02/04/21  1451     History Chief Complaint  Patient presents with   Wound Infection    [name]. is a 50 y.o. male.  HPI Patient presents with right hand wound.  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  Recent admission for infection.  Has planned vascular procedure at [facility] tomorrow.  Also likely Ortho procedure on Friday.  Ortho procedure depends on what they find on the vascular surgery.  States started to have fevers and chills.  Started last night.  States he is feeling more pain and more swelling in the hand.  Saw vascular surgery today.  States he called them later and they said to come into the ER due to increased pain and swelling. Is currently on antibiotics.  Patient is also dialysis patient is due for dialysis tomorrow    Past Medical History:  Diagnosis Date   Anxiety    Cellulitis    CHF (congestive heart failure) (HCC)    Chronic diarrhea    Chronic kidney disease    Chronic pain of both ankles    Diabetes mellitus without complication (HCC)    Diabetes mellitus, type II (HCC)    Diabetic neuropathy (HCC)    Frequent falls    Gait instability    GERD (gastroesophageal reflux disease)    Heart palpitations    Hemodialysis patient ([place])    History of kidney stones    HLD (hyperlipidemia)    Hypertension    Insomnia    Nausea and vomiting in adult    recurrent    Patient Active Problem List   Diagnosis Date Noted   Infected wound 01/29/2021   Wound dehiscence, surgical, initial encounter 01/29/2021   ESRD on hemodialysis ([place])    Prolonged Q-T interval on ECG    Anemia due to chronic kidney disease, on chronic dialysis (HCC)    Atrial fibrillation with RVR ([place]) 12/08/2020   Finger amputation, 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 35/57/3220   Atrial fibrillation with rapid ventricular response (HCC)    Gangrene of finger of right hand ([place])  11/25/2020   Postural dizziness with presyncope 11/24/2020   Elevated troponin 11/24/2020   Left leg cellulitis 11/24/2020   Frequent falls 11/24/2020   Finger infection, fourth left 11/24/2020   AF (paroxysmal atrial fibrillation) ([place]) 11/24/2020   Hypotension 11/24/2020   NSTEMI (non-ST elevated myocardial infarction) ([place]) 11/24/2020   Abnormal gait 08/19/2020   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[place]) 08/19/2020   Anxiety state 08/19/2020   Callosity 08/19/2020   ED (erectile dysfunction) of organic origin 08/19/2020   Heart failure ([place]) 08/19/2020   Long term (current) use of insulin ([place]) 08/19/2020   Myalgia 08/19/2020   Obstructive uropathy 08/19/2020   Reactive depression 08/19/2020   History of ST elevation myocardial infarction (STEMI) 02/2020 s/p DES to LAD 08/19/2020   ESRD (end stage renal disease) ([place]) 08/19/2020   Blind painful left eye 08/15/2020   Combined forms of age-related cataract of right eye 04/17/2020   Supraventricular tachycardia ([place]) 02/26/2020   Acute anterior wall MI ([place])    Benign prostatic hyperplasia with lower urinary tract symptoms 11/07/2019   Fluid overload, unspecified 08/15/2019   Hypertension secondary to other renal disorders 07/20/2019   NVG (neovascular glaucoma), left, 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   Symptomatic anemia 06/05/2019   Aftercare including intermittent dialysis ([place]) 05/15/2019   Moderate protein-calorie malnutrition ([place]) 05/14/2019  Anaphylactic reaction due to adverse effect of correct drug or medicament properly administered, initial encounter 05/08/2019   Chronic anemia 05/07/2019   Chest pain, unspecified 05/07/2019   Coagulation defect, unspecified ([place]) 05/07/2019   Disorder of phosphorus metabolism, unspecified 05/07/2019   Encounter for fitting and adjustment of extracorporeal dialysis catheter ([place]) 05/07/2019   Gastroparesis 05/07/2019   Iron deficiency anemia, unspecified 05/07/2019   Major depressive  disorder, recurrent, unspecified ([place]) 05/07/2019   Pain, unspecified 05/07/2019   Pruritus, unspecified 05/07/2019   Dizziness 32/67/1245   Metabolic acidosis 80/99/8338   Syncope 04/06/2019   Urinary retention 04/06/2019   Hypertensive chronic kidney disease with stage 1 through stage 4 chronic kidney disease, or unspecified chronic kidney disease 11/28/2018   Edema 11/28/2018   Hypokalemia 11/28/2018   Hypo-osmolality and hyponatremia 11/28/2018   Proteinuria 11/28/2018   Secondary hyperparathyroidism of renal origin ([place]) 11/28/2018   Lymphedema 01/09/2018   Chronic diastolic heart failure ([place]) 12/13/2017   Vomiting 11/28/2017   Acute on chronic heart failure ([place]) 11/23/2017   Diabetes mellitus, type II ([place]) 11/23/2017   Diabetic retinopathy ([place]) 07/05/2017   Uncontrolled type 2 diabetes mellitus with hyperglycemia ([place]) 07/08/2016   Uncontrolled type 2 diabetes mellitus with hyperglycemia, with long-term current use of insulin ([place]) 04/20/2016   Osteomyelitis of toe of left foot ([place]) 03/15/2016   Acute renal failure with tubular necrosis ([place]) 06/23/2015   Diarrhea 06/23/2015   Mixed hyperlipidemia    Essential hypertension, benign    Heart palpitations 05/06/2015   Sepsis ([place]) 03/07/2015    Past Surgical History:  Procedure Laterality Date   AMPUTATION Left 03/16/2016   Procedure: AMPUTATION DIGIT LEFT HALLUX;  Surgeon: Trula Slade, DPM;  Location: [facility];  Service: Podiatry;  Laterality: Left;  can start around 5    ARTHROSCOPIC REPAIR ACL Left    AV FISTULA PLACEMENT Right 05/31/2019   Procedure: Brachiocephalic AV fistula creation;  Surgeon: Algernon Huxley, MD;  Location: [name] ORS;  Service: Vascular;  Laterality: Right;   COLONOSCOPY WITH PROPOFOL N/A 10/28/2015   Procedure: COLONOSCOPY WITH PROPOFOL;  Surgeon: Lollie Sails, MD;  Location: [facility] ENDOSCOPY;  Service: Endoscopy;  Laterality: N/A;   COLONOSCOPY WITH PROPOFOL N/A 10/29/2015   Procedure: COLONOSCOPY  WITH PROPOFOL;  Surgeon: Lollie Sails, MD;  Location[facility] ENDOSCOPY;  Service: Endoscopy;  Laterality: N/A;   CORONARY/GRAFT ACUTE MI REVASCULARIZATION N/A 02/26/2020   Procedure: Coronary/Graft Acute MI Revascularization;  Surgeon: Jettie Booze, MD;  Location: [facility] CV LAB;  Service: Cardiovascular;  Laterality: N/A;   DIALYSIS/PERMA CATHETER INSERTION Right 04/26/2019   Perm Cath    DIALYSIS/PERMA CATHETER INSERTION N/A 04/26/2019   Procedure: DIALYSIS/PERMA CATHETER INSERTION;  Surgeon: Algernon Huxley, MD;  Location: [facility] CV LAB;  Service: Cardiovascular;  Laterality: N/A;   DIALYSIS/PERMA CATHETER REMOVAL N/A 08/15/2019   Procedure: DIALYSIS/PERMA CATHETER REMOVAL;  Surgeon: Katha Cabal, MD;  Location: [place] CV LAB;  Service: Cardiovascular;  Laterality: N/A;   ESOPHAGOGASTRODUODENOSCOPY (EGD) WITH PROPOFOL N/A 12/27/2017   Procedure: ESOPHAGOGASTRODUODENOSCOPY (EGD) WITH PROPOFOL;  Surgeon: Toledo, Benay Pike, MD;  Location: [name] ENDOSCOPY;  Service: Gastroenterology;  Laterality: N/A;   INTRAVASCULAR ULTRASOUND/IVUS N/A 02/26/2020   Procedure: Intravascular Ultrasound/IVUS;  Surgeon: Jettie Booze, MD;  Location: [place] CV LAB;  Service: Cardiovascular;  Laterality: N/A;   LEFT HEART CATH AND CORONARY ANGIOGRAPHY N/A 02/26/2020   Procedure: LEFT HEART CATH AND CORONARY ANGIOGRAPHY;  Surgeon: Jettie Booze, MD;  Location: [place]  CV LAB;  Service: Cardiovascular;  Laterality: N/A;   PROSTATE SURGERY  2016   TONSILECTOMY/ADENOIDECTOMY WITH MYRINGOTOMY     TONSILLECTOMY     UPPER EXTREMITY ANGIOGRAPHY Right 11/26/2020   Procedure: Upper Extremity Angiography;  Surgeon: Algernon Huxley, MD;  Location: [place] CV LAB;  Service: Cardiovascular;  Laterality: Right;       Family History  Problem Relation Age of Onset   CAD Father    Stroke Father    Diabetes Mellitus II Mother    Kidney failure Mother    Schizophrenia Mother      Social History   Tobacco Use   Smoking status: Never   Smokeless tobacco: Never  Vaping Use   Vaping Use: Never used  Substance Use Topics   Alcohol use: No   Drug use: No    Home Medications Prior to Admission medications   Medication Sig Start Date End Date Taking? Authorizing Provider  acetaminophen (TYLENOL) 325 MG tablet Take 2 tablets (650 mg total) by mouth every 6 (six) hours as needed for mild pain (or Fever >/= 101). 01/30/21  Yes Richarda Osmond, MD  amiodarone (PACERONE) 200 MG tablet Take 1 tablet (200 mg total) by mouth daily. 1 twice daily for 2 weeks, then 1 daily 01/09/21  Yes Eugenie Filler, MD  atorvastatin (LIPITOR) 80 MG tablet TAKE 1 TABLET (80 MG TOTAL) BY MOUTH AT BEDTIME. 02/28/20 02/27/21 Yes Cheryln Manly, NP  calcium acetate (PHOSLO) 667 MG capsule Take 3 capsules (2,001 mg total) by mouth 3 (three) times daily with meals. 11/27/20  Yes Sharen Hones, MD  carvedilol (COREG) 25 MG tablet Take 1 tablet (25 mg total) by mouth 2 (two) times daily with a meal. 12/09/20  Yes Johnson, Clanford L, MD  clopidogrel (PLAVIX) 75 MG tablet Take 1 tablet (75 mg total) by mouth daily. 12/09/20  Yes Johnson, Clanford L, MD  doxycycline (ADOXA) 100 MG tablet Take 1 tablet (100 mg total) by mouth 2 (two) times daily for 7 days. 01/30/21 02/06/21 Yes Richarda Osmond, MD  FEROSUL 325 (65 Fe) MG tablet Take 325 mg by mouth daily. 09/08/20  Yes [provider]  HYDROcodone-acetaminophen (NORCO/VICODIN) 5-325 MG tablet Take 2 tablets by mouth 2 (two) times daily. 01/23/21  Yes [provider]  nitroGLYCERIN (NITROSTAT) 0.4 MG SL tablet Place 1 tablet (0.4 mg total) under the tongue every 5 (five) minutes as needed for chest pain. 12/09/20 12/09/21 Yes Johnson, Clanford L, MD  prednisoLONE acetate (PRED FORTE) 1 % ophthalmic suspension Place 1 drop into the left eye every 4 (four) hours.   Yes [provider]  tamsulosin (FLOMAX) 0.4 MG CAPS  capsule Take 0.4 mg by mouth daily after breakfast.    Yes [provider]  amoxicillin-clavulanate (AUGMENTIN) 500-125 MG tablet Take 1 tablet (500 mg total) by mouth daily for 7 days. Must be taken AFTER dialysis on dialysis days. Patient not taking: Reported on 02/04/2021 01/30/21 02/06/21  Richarda Osmond, MD  azelastine (ASTELIN) 0.1 % nasal spray Place 2 sprays into both nostrils 2 (two) times daily. 09/08/20   [provider]  fluticasone (FLONASE) 50 MCG/ACT nasal spray Place 2 sprays into both nostrils daily. Patient not taking: Reported on 02/04/2021 09/08/20   [provider]  lidocaine-prilocaine (EMLA) cream Apply 1 application topically Every Tuesday,Thursday,and Saturday with dialysis.  Patient not taking: Reported on 02/04/2021 08/15/19   [provider]  losartan (COZAAR) 25 MG tablet Take 25 mg by  mouth daily. Patient not taking: Reported on 02/04/2021 01/21/21   [provider]    Allergies    Levofloxacin, Promethazine, Other, and Malt  Review of Systems   Review of Systems  Constitutional:  Positive for chills. Negative for appetite change.  HENT:  Negative for congestion.   Respiratory:  Negative for shortness of breath.   Cardiovascular:  Negative for chest pain.  Gastrointestinal:  Negative for abdominal pain.  Genitourinary:  Negative for penile discharge.  Musculoskeletal:        Increased pain and swelling of right hand.  Skin:  Positive for wound.  Neurological:  Negative for weakness.  Psychiatric/Behavioral:  Negative for confusion.    Physical Exam Updated Vital Signs BP (!) 194/84    Pulse 77    Temp 98.4 F (36.9 C) (Oral)    Resp 18    Wt 89.4 kg    SpO2 100%    BMI 24.62 kg/m   Physical Exam Vitals and nursing note reviewed.  HENT:     Head: Atraumatic.  Eyes:     Pupils: Pupils are equal, round, and reactive to light.  Cardiovascular:     Rate and Rhythm: Regular rhythm.  Pulmonary:     Breath  sounds: No wheezing or rhonchi.  Abdominal:     Tenderness: There is no abdominal tenderness.  Musculoskeletal:     Cervical back: Neck supple.     Comments: Previous amputation of right ring finger on the proximal phalanx.  Exposed wound.  Tenderness and swelling over hand also.  Skin:    General: Skin is warm.     Capillary Refill: Capillary refill takes less than 2 seconds.  Neurological:     Mental Status: He is alert.     ED Results / Procedures / Treatments   Labs (all labs ordered are listed, but only abnormal results are displayed) Labs Reviewed  CBC WITH DIFFERENTIAL/PLATELET - Abnormal; Notable for the following components:      Result Value   WBC 10.8 (*)    RBC 3.15 (*)    Hemoglobin 7.4 (*)    HCT 25.5 (*)    MCH 23.5 (*)    MCHC 29.0 (*)    RDW 20.0 (*)    Monocytes Absolute 1.4 (*)    All other components within normal limits  COMPREHENSIVE METABOLIC PANEL - Abnormal; Notable for the following components:   Sodium 149 (*)    Potassium 5.3 (*)    Chloride 121 (*)    CO2 10 (*)    BUN 79 (*)    Creatinine, Ser 17.99 (*)    Calcium 8.5 (*)    Albumin 3.0 (*)    AST 11 (*)    Total Bilirubin 0.2 (*)    GFR, Estimated 3 (*)    Anion gap 18 (*)    All other components within normal limits  CULTURE, BLOOD (ROUTINE X 2)  CULTURE, BLOOD (ROUTINE X 2)  RESP PANEL BY RT-PCR (FLU A&B, COVID) ARPGX2  LACTIC ACID, PLASMA    EKG None  Radiology DG Hand Complete Right  Result Date: 02/04/2021 CLINICAL DATA:  Right hand wound, fever.  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 New destructive changes involving the base of the ring finger proximal phalanx compatible with acute osteomyelitis. No fracture or dislocation. Osseous structures are demineralized. Severe small vessel atherosclerotic  calcifications. Prominent soft  tissue swelling of the hand. IMPRESSION: New destructive changes involving the base of the ring finger proximal phalanx compatible with acute osteomyelitis. Electronically Signed   By: Davina Poke D.O.   On: 02/04/2021 16:49    Procedures Procedures   Medications Ordered in ED Medications  vancomycin (VANCOREADY) IVPB 1500 mg/300 mL (1,500 mg Intravenous New Bag/Given 02/04/21 2002)  ceFEPIme (MAXIPIME) 2 g in sodium chloride 0.9 % 100 mL IVPB (0 g Intravenous Stopped 02/04/21 2003)    ED Course  I have reviewed the triage vital signs and the nursing notes.  Pertinent labs & imaging results that were available during my care of the patient were reviewed by me and considered in my medical decision making (see chart for details).    MDM Rules/Calculators/A&P                           Patient with osteomyelitis on right hand.  Worsening infection.  Discussed with vascular surgery who is due for procedure tomorrow.  Will transfer to [facility] for admission peer discussed with Dr. [name] Poet who accepted patient in transfer.  Request I talked to bed control.  Bed control ended up putting in a bed request.  Cefepime and Vanco have been given.  Does not appear septic at this time.  Will transfer Final Clinical Impression(s) / ED Diagnoses Final diagnoses:  Osteomyelitis of right hand, unspecified type ([place])  End stage renal disease on dialysis [facility])    Rx / [place] Orders ED Discharge Orders     None        Davonna Belling, MD 02/04/21 2017

## 2021-02-05 ENCOUNTER — Ambulatory Visit: Admission: RE | Admit: 2021-02-05 | Payer: Medicare Other | Source: Home / Self Care | Admitting: Vascular Surgery

## 2021-02-05 ENCOUNTER — Encounter
Admission: AD | Disposition: A | Discharge status: Other Acute Inpatient Hospital | Payer: Self-pay | Source: Other Acute Inpatient Hospital | Attending: Family Medicine

## 2021-02-05 ENCOUNTER — Other Ambulatory Visit (INDEPENDENT_AMBULATORY_CARE_PROVIDER_SITE_OTHER): Payer: Self-pay | Admitting: Nurse Practitioner

## 2021-02-05 ENCOUNTER — Encounter (INDEPENDENT_AMBULATORY_CARE_PROVIDER_SITE_OTHER): Payer: Self-pay | Admitting: Nurse Practitioner

## 2021-02-05 ENCOUNTER — Other Ambulatory Visit: Payer: Self-pay

## 2021-02-05 DIAGNOSIS — M869 Osteomyelitis, unspecified: Secondary | ICD-10-CM | POA: Diagnosis present

## 2021-02-05 DIAGNOSIS — I132 Hypertensive heart and chronic kidney disease with heart failure and with stage 5 chronic kidney disease, or end stage renal disease: Secondary | ICD-10-CM | POA: Diagnosis present

## 2021-02-05 DIAGNOSIS — I480 Paroxysmal atrial fibrillation: Secondary | ICD-10-CM | POA: Diagnosis present

## 2021-02-05 DIAGNOSIS — E870 Hyperosmolality and hypernatremia: Secondary | ICD-10-CM | POA: Diagnosis present

## 2021-02-05 DIAGNOSIS — Z992 Dependence on renal dialysis: Secondary | ICD-10-CM | POA: Diagnosis not present

## 2021-02-05 DIAGNOSIS — E1169 Type 2 diabetes mellitus with other specified complication: Secondary | ICD-10-CM | POA: Diagnosis present

## 2021-02-05 DIAGNOSIS — N2581 Secondary hyperparathyroidism of renal origin: Secondary | ICD-10-CM | POA: Diagnosis present

## 2021-02-05 DIAGNOSIS — I2510 Atherosclerotic heart disease of native coronary artery without angina pectoris: Secondary | ICD-10-CM

## 2021-02-05 DIAGNOSIS — M86141 Other acute osteomyelitis, right hand: Secondary | ICD-10-CM | POA: Diagnosis present

## 2021-02-05 DIAGNOSIS — G458 Other transient cerebral ischemic attacks and related syndromes: Secondary | ICD-10-CM

## 2021-02-05 DIAGNOSIS — E1152 Type 2 diabetes mellitus with diabetic peripheral angiopathy with gangrene: Secondary | ICD-10-CM | POA: Diagnosis present

## 2021-02-05 DIAGNOSIS — T8781 Dehiscence of amputation stump: Secondary | ICD-10-CM | POA: Diagnosis present

## 2021-02-05 DIAGNOSIS — E875 Hyperkalemia: Secondary | ICD-10-CM | POA: Diagnosis present

## 2021-02-05 DIAGNOSIS — N186 End stage renal disease: Secondary | ICD-10-CM | POA: Diagnosis present

## 2021-02-05 DIAGNOSIS — K3184 Gastroparesis: Secondary | ICD-10-CM | POA: Diagnosis present

## 2021-02-05 DIAGNOSIS — Y838 Other surgical procedures as the cause of abnormal reaction of the patient, or of later complication, without mention of misadventure at the time of the procedure: Secondary | ICD-10-CM | POA: Diagnosis present

## 2021-02-05 DIAGNOSIS — E1122 Type 2 diabetes mellitus with diabetic chronic kidney disease: Secondary | ICD-10-CM | POA: Diagnosis present

## 2021-02-05 DIAGNOSIS — F339 Major depressive disorder, recurrent, unspecified: Secondary | ICD-10-CM | POA: Diagnosis present

## 2021-02-05 DIAGNOSIS — E1143 Type 2 diabetes mellitus with diabetic autonomic (poly)neuropathy: Secondary | ICD-10-CM | POA: Diagnosis present

## 2021-02-05 DIAGNOSIS — L089 Local infection of the skin and subcutaneous tissue, unspecified: Secondary | ICD-10-CM | POA: Diagnosis not present

## 2021-02-05 DIAGNOSIS — D631 Anemia in chronic kidney disease: Secondary | ICD-10-CM | POA: Diagnosis present

## 2021-02-05 DIAGNOSIS — N185 Chronic kidney disease, stage 5: Secondary | ICD-10-CM | POA: Diagnosis not present

## 2021-02-05 DIAGNOSIS — I5032 Chronic diastolic (congestive) heart failure: Secondary | ICD-10-CM | POA: Diagnosis present

## 2021-02-05 DIAGNOSIS — Z794 Long term (current) use of insulin: Secondary | ICD-10-CM | POA: Diagnosis not present

## 2021-02-05 DIAGNOSIS — Y832 Surgical operation with anastomosis, bypass or graft as the cause of abnormal reaction of the patient, or of later complication, without mention of misadventure at the time of the procedure: Secondary | ICD-10-CM | POA: Diagnosis present

## 2021-02-05 DIAGNOSIS — E1165 Type 2 diabetes mellitus with hyperglycemia: Secondary | ICD-10-CM | POA: Diagnosis present

## 2021-02-05 DIAGNOSIS — E440 Moderate protein-calorie malnutrition: Secondary | ICD-10-CM | POA: Diagnosis present

## 2021-02-05 DIAGNOSIS — T82898A Other specified complication of vascular prosthetic devices, implants and grafts, initial encounter: Secondary | ICD-10-CM | POA: Diagnosis present

## 2021-02-05 DIAGNOSIS — T148XXA Other injury of unspecified body region, initial encounter: Secondary | ICD-10-CM | POA: Diagnosis not present

## 2021-02-05 DIAGNOSIS — D509 Iron deficiency anemia, unspecified: Secondary | ICD-10-CM | POA: Diagnosis present

## 2021-02-05 DIAGNOSIS — E872 Acidosis, unspecified: Secondary | ICD-10-CM | POA: Diagnosis present

## 2021-02-05 HISTORY — PX: UPPER EXTREMITY ANGIOGRAPHY: CATH118270

## 2021-02-05 LAB — MAGNESIUM: Magnesium: 2.5 mg/dL — ABNORMAL HIGH (ref 1.7–2.4)

## 2021-02-05 LAB — HEPATITIS B SURFACE ANTIBODY,QUALITATIVE: Hep B S Ab: REACTIVE — AB

## 2021-02-05 LAB — BASIC METABOLIC PANEL
Anion gap: 16 — ABNORMAL HIGH (ref 5–15)
BUN: 82 mg/dL — ABNORMAL HIGH (ref 6–20)
CO2: 10 mmol/L — ABNORMAL LOW (ref 22–32)
Calcium: 8.3 mg/dL — ABNORMAL LOW (ref 8.9–10.3)
Chloride: 120 mmol/L — ABNORMAL HIGH (ref 98–111)
Creatinine, Ser: 17.53 mg/dL — ABNORMAL HIGH (ref 0.61–1.24)
GFR, Estimated: 3 mL/min — ABNORMAL LOW (ref >60)
Glucose, Bld: 85 mg/dL (ref 70–99)
Potassium: 5.8 mmol/L — ABNORMAL HIGH (ref 3.5–5.1)
Sodium: 146 mmol/L — ABNORMAL HIGH (ref 135–145)

## 2021-02-05 LAB — CBC
HCT: 23.5 % — ABNORMAL LOW (ref 39.0–52.0)
Hemoglobin: 7.2 g/dL — ABNORMAL LOW (ref 13.0–17.0)
MCH: 24 pg — ABNORMAL LOW (ref 26.0–34.0)
MCHC: 30.6 g/dL (ref 30.0–36.0)
MCV: 78.3 fL — ABNORMAL LOW (ref 80.0–100.0)
Platelets: 257 10*3/uL (ref 150–400)
RBC: 3 MIL/uL — ABNORMAL LOW (ref 4.22–5.81)
RDW: 19.7 % — ABNORMAL HIGH (ref 11.5–15.5)
WBC: 12.6 10*3/uL — ABNORMAL HIGH (ref 4.0–10.5)
nRBC: 0 % (ref 0.0–0.2)

## 2021-02-05 LAB — HEPATITIS B SURFACE ANTIGEN: Hepatitis B Surface Ag: NONREACTIVE

## 2021-02-05 LAB — C-REACTIVE PROTEIN: CRP: 3.6 mg/dL — ABNORMAL HIGH (ref <1.0)

## 2021-02-05 LAB — MRSA NEXT GEN BY PCR, NASAL: MRSA by PCR Next Gen: NOT DETECTED

## 2021-02-05 LAB — SEDIMENTATION RATE: Sed Rate: 97 mm/hr — ABNORMAL HIGH (ref 0–20)

## 2021-02-05 LAB — PHOSPHORUS: Phosphorus: 11.5 mg/dL — ABNORMAL HIGH (ref 2.5–4.6)

## 2021-02-05 SURGERY — UPPER EXTREMITY ANGIOGRAPHY
Anesthesia: Moderate Sedation | Laterality: Right

## 2021-02-05 MED ORDER — MIDAZOLAM HCL 5 MG/5ML IJ SOLN
INTRAMUSCULAR | Status: AC
  Filled 2021-02-05: qty 5

## 2021-02-05 MED ORDER — VANCOMYCIN VARIABLE DOSE PER UNSTABLE RENAL FUNCTION (PHARMACIST DOSING): Status: DC

## 2021-02-05 MED ORDER — HEPARIN SODIUM (PORCINE) 1000 UNIT/ML IJ SOLN
INTRAMUSCULAR | Status: AC
  Filled 2021-02-05: qty 10

## 2021-02-05 MED ORDER — METHYLPREDNISOLONE SODIUM SUCC 125 MG IJ SOLR: 125.0000 mg | Freq: Once | INTRAMUSCULAR | Status: DC | PRN

## 2021-02-05 MED ORDER — FENTANYL CITRATE PF 50 MCG/ML IJ SOSY
PREFILLED_SYRINGE | INTRAMUSCULAR | Status: AC
  Filled 2021-02-05: qty 1

## 2021-02-05 MED ORDER — VANCOMYCIN HCL IN DEXTROSE 1-5 GM/200ML-% IV SOLN
1000.0000 mg | INTRAVENOUS | Status: DC
  Administered 2021-02-05 – 2021-02-07 (×2): 1000 mg via INTRAVENOUS
  Filled 2021-02-05 (×3): qty 200

## 2021-02-05 MED ORDER — AMIODARONE HCL 200 MG PO TABS
200.0000 mg | ORAL_TABLET | Freq: Every day | ORAL | Status: DC
  Administered 2021-02-06 – 2021-02-08 (×3): 200 mg via ORAL
  Filled 2021-02-05 (×3): qty 1

## 2021-02-05 MED ORDER — VANCOMYCIN HCL 1000 MG IV SOLR
1000.0000 mg | INTRAVENOUS | Status: DC | PRN
Start: 2021-02-05 — End: 2021-02-05
  Filled 2021-02-05: qty 20

## 2021-02-05 MED ORDER — SODIUM CHLORIDE 0.9 % IV SOLN
1.0000 g | INTRAVENOUS | Status: DC
  Administered 2021-02-05 – 2021-02-08 (×4): 1 g via INTRAVENOUS
  Filled 2021-02-05 (×5): qty 1

## 2021-02-05 MED ORDER — FAMOTIDINE 20 MG PO TABS: 40.0000 mg | ORAL_TABLET | Freq: Once | ORAL | Status: DC | PRN

## 2021-02-05 MED ORDER — MIDAZOLAM HCL 2 MG/2ML IJ SOLN
INTRAMUSCULAR | Status: DC | PRN
  Administered 2021-02-05: 2 mg via INTRAVENOUS
  Administered 2021-02-05: 1 mg via INTRAVENOUS

## 2021-02-05 MED ORDER — MIDAZOLAM HCL 2 MG/ML PO SYRP
8.0000 mg | ORAL_SOLUTION | Freq: Once | ORAL | Status: DC | PRN
  Filled 2021-02-05: qty 4

## 2021-02-05 MED ORDER — CHLORHEXIDINE GLUCONATE CLOTH 2 % EX PADS: 6.0000 | MEDICATED_PAD | Freq: Every day | CUTANEOUS | Status: DC

## 2021-02-05 MED ORDER — HYDROMORPHONE HCL 1 MG/ML IJ SOLN: 1.0000 mg | Freq: Once | INTRAMUSCULAR | Status: DC | PRN

## 2021-02-05 MED ORDER — CEFAZOLIN SODIUM-DEXTROSE 2-4 GM/100ML-% IV SOLN
2.0000 g | INTRAVENOUS | Status: AC
  Administered 2021-02-05: 2 g via INTRAVENOUS

## 2021-02-05 MED ORDER — IODIXANOL 320 MG/ML IV SOLN
INTRAVENOUS | Status: DC | PRN
  Administered 2021-02-05: 65 mL via INTRA_ARTERIAL

## 2021-02-05 MED ORDER — LABETALOL HCL 5 MG/ML IV SOLN
5.0000 mg | INTRAVENOUS | Status: DC | PRN
  Administered 2021-02-05: 5 mg via INTRAVENOUS
  Filled 2021-02-05: qty 4

## 2021-02-05 MED ORDER — METHOCARBAMOL 500 MG PO TABS: 500.0000 mg | ORAL_TABLET | Freq: Once | ORAL | Status: DC

## 2021-02-05 MED ORDER — DIPHENHYDRAMINE HCL 50 MG/ML IJ SOLN: 50.0000 mg | Freq: Once | INTRAMUSCULAR | Status: DC | PRN

## 2021-02-05 MED ORDER — FENTANYL CITRATE (PF) 100 MCG/2ML IJ SOLN
INTRAMUSCULAR | Status: DC | PRN
  Administered 2021-02-05: 25 ug via INTRAVENOUS
  Administered 2021-02-05: 50 ug via INTRAVENOUS

## 2021-02-05 MED ORDER — CALCIUM ACETATE (PHOS BINDER) 667 MG PO CAPS
2001.0000 mg | ORAL_CAPSULE | Freq: Three times a day (TID) | ORAL | Status: DC
  Administered 2021-02-05 – 2021-02-08 (×8): 2001 mg via ORAL
  Filled 2021-02-05 (×8): qty 3

## 2021-02-05 MED ORDER — MORPHINE SULFATE (PF) 2 MG/ML IV SOLN: 0.5000 mg | INTRAVENOUS | Status: DC | PRN

## 2021-02-05 MED ORDER — ONDANSETRON HCL 4 MG/2ML IJ SOLN
4.0000 mg | Freq: Four times a day (QID) | INTRAMUSCULAR | Status: DC | PRN
  Administered 2021-02-06: 4 mg via INTRAVENOUS
  Filled 2021-02-05: qty 2

## 2021-02-05 MED ORDER — SODIUM CHLORIDE 0.9 % IV SOLN: INTRAVENOUS | Status: DC

## 2021-02-05 MED ORDER — HEPARIN SODIUM (PORCINE) 1000 UNIT/ML IJ SOLN
INTRAMUSCULAR | Status: DC | PRN
  Administered 2021-02-05: 3000 [IU] via INTRAVENOUS

## 2021-02-05 MED ORDER — MORPHINE SULFATE (PF) 2 MG/ML IV SOLN
0.5000 mg | INTRAVENOUS | Status: AC | PRN
  Administered 2021-02-05: 0.5 mg via INTRAVENOUS
  Filled 2021-02-05: qty 1

## 2021-02-05 MED ORDER — CARVEDILOL 25 MG PO TABS
25.0000 mg | ORAL_TABLET | Freq: Once | ORAL | Status: AC
  Administered 2021-02-05: 25 mg via ORAL
  Filled 2021-02-05: qty 1

## 2021-02-05 MED ORDER — METRONIDAZOLE 500 MG PO TABS
500.0000 mg | ORAL_TABLET | Freq: Three times a day (TID) | ORAL | Status: DC
  Administered 2021-02-05 – 2021-02-08 (×8): 500 mg via ORAL
  Filled 2021-02-05 (×10): qty 1

## 2021-02-05 MED ORDER — HEPARIN SODIUM (PORCINE) 5000 UNIT/ML IJ SOLN
5000.0000 [IU] | Freq: Three times a day (TID) | INTRAMUSCULAR | Status: DC
  Administered 2021-02-05 – 2021-02-08 (×8): 5000 [IU] via SUBCUTANEOUS
  Filled 2021-02-05 (×10): qty 1

## 2021-02-05 SURGICAL SUPPLY — 20 items
BALLN ULTRVRSE 3X100X150 (BALLOONS) ×3
BALLN ULTRVRSE RX 2.5X100X200 (BALLOONS) ×3
BALLOON ULTRVRSE 3X100X150 (BALLOONS) IMPLANT
BALLOON ULTRVS RX 2.5X100X200 (BALLOONS) IMPLANT
CATH ANGIO 5F PIGTAIL 100CM (CATHETERS) ×2 IMPLANT
CATH BEACON 5 .035 100 H1 TIP (CATHETERS) ×2 IMPLANT
CATH SEEKER .035X150CM (CATHETERS) ×2 IMPLANT
COVER PROBE U/S 5X48 (MISCELLANEOUS) ×2 IMPLANT
DEVICE STARCLOSE SE CLOSURE (Vascular Products) ×2 IMPLANT
DEVICE TORQUE (MISCELLANEOUS) ×2 IMPLANT
GAUZE SPONGE 4X4 12PLY STRL (GAUZE/BANDAGES/DRESSINGS) ×6 IMPLANT
GLIDEWIRE ADV .014X300CM (WIRE) ×2 IMPLANT
GLIDEWIRE ANGLED SS 035X260CM (WIRE) ×2 IMPLANT
KIT ENCORE 26 ADVANTAGE (KITS) ×2 IMPLANT
PACK ANGIOGRAPHY (CUSTOM PROCEDURE TRAY) ×2 IMPLANT
SHEATH PINNACLE 5F 10CM (SHEATH) ×2 IMPLANT
SYR MEDRAD MARK 7 150ML (SYRINGE) ×2 IMPLANT
TUBING CONTRAST HIGH PRESS 72 (TUBING) ×2 IMPLANT
WIRE G V18X300CM (WIRE) ×2 IMPLANT
WIRE GUIDERIGHT .035X150 (WIRE) ×4 IMPLANT

## 2021-02-05 NOTE — Progress Notes (Signed)
Pharmacy Antibiotic Note  [name]. is a 50 y.o. male arriving from [facility] ED, admitted  on 02/04/2021 with  osteomyelitis .  Pharmacy has been consulted for vanc and cefepime dosing.  Pt presented with hand wound with vascular procedure plan for 12/15 at [facility]. Pt had fevers and chills. Pt is on a TTS dialysis schedule. He received vancomycin 1500mg  x 1 dose.   Plan: Vancomycin: will plan for vancomycin 1g IV to be given with HD on HD days. Patient normally receives HD on Tues, Thurs, Sat. Will need to monitor HD schedule inpatient and make sure patient receives vancomycin doses appropriately.   Cefepime: Will order Cefepime 1g IV every 24 hours    Temp (24hrs), Avg:98.4 F (36.9 C), Min:97.9 F (36.6 C), Max:98.9 F (37.2 C)  Recent Labs  Lab 01/30/21 0412 02/04/21 1628 02/05/21 0143  WBC 10.9* 10.8* 12.6*  CREATININE 9.15* 17.99* 17.53*  LATICACIDVEN  --  0.6  --      Estimated Creatinine Clearance: 5.9 mL/min (A) (by C-G formula based on SCr of 17.53 mg/dL (H)).    Allergies  Allergen Reactions   Levofloxacin Swelling    Of face Other reaction(s): swelling   Promethazine Diarrhea and Other (See Comments)    Other reaction(s): Other (See Comments), Other (See Comments) Other reaction(s): Muscle cramps Muscle cramps Cramping Other reaction(s): Muscle cramps Muscle cramps   Other     Other reaction(s): Unknown   Malt Other (See Comments)    Other reaction(s): cramping    Antimicrobials this admission: 12/14 vanc>> 12/14 cefepime >>   Microbiology results: 12/14 blood>>  Pernell Dupre, PharmD, BCPS Clinical Pharmacist 02/05/2021 11:29 AM

## 2021-02-05 NOTE — Assessment & Plan Note (Signed)
Statin, coreg

## 2021-02-05 NOTE — Assessment & Plan Note (Addendum)
Continue amiodarone, coreg Discharged on eliquis on 11/18, but looks like anticoagulation has fallen off (not on 12/9 d/c summary)? Plavix and eliquis were being held in preparation for surgery.

## 2021-02-05 NOTE — Assessment & Plan Note (Addendum)
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

## 2021-02-05 NOTE — Assessment & Plan Note (Signed)
coreg 

## 2021-02-05 NOTE — Assessment & Plan Note (Addendum)
Sed rate 97 CRP 3.6 Fever curve improved, last elevated temp to 100 was 12/17 ~1146 Worsened leukocytosis today MRI hand with osteomyelitis of residual ring finger proximal phalanx, extensive soft tissue swelling and intramuscular edema General ortho recommended transfer to [place] care facility with hand specialist - I discussed with Dr. Tempie Donning who will see patient at [facility] Negative MRSA PCR Vancomycin/cefepime, add flagyl Blood cultures on admission NG 12/8 cx with citrobacter youngae and proteus mirabilis and mixed anaerobic flora Plain films 12/14 with new destructive changes involving base of ring finger proximal phalanx compatible with acute osteo S/p right upper extremity angiogram with angioplasty of distal right ulnar artery and proximal right radial artery He's now s/p coil embolization of the right brachiocephalic AV fistula as well as large cephalic vein branch and placement of tunneled HD cath after discussion with vascular regarding his options Appreciate vascular and orthopedic recommendations.   Would recommend ID consult as well

## 2021-02-05 NOTE — Progress Notes (Addendum)
Pt is complaining of cramping. NP Randol [name] made aware. Will continue to monitor.  Update 2026: NP Morrsion ordered methocarbamol (ROBAXIN) 500 mg tablet once. Will continue to monitor.  Update 0705: Pt hgb at 6.5. VSS. MD Florene Glen made aware and incoming shift made aware. Will continue to monitor.

## 2021-02-05 NOTE — Plan of Care (Signed)
  Problem: Education: Goal: Knowledge of General Education information will improve Description: Including pain rating scale, medication(s)/side effects and non-pharmacologic comfort measures Outcome: Progressing   Problem: Clinical Measurements: Goal: Ability to maintain clinical measurements within normal limits will improve Outcome: Progressing   

## 2021-02-05 NOTE — Progress Notes (Signed)
PROGRESS NOTE    [name].  DEY:[id number] DOB: [birth year] DOA: 02/04/2021 PCP: [name], MD  No chief complaint on file.   Brief Narrative:  50 yo with hx ESRD (T/Th), HTN, T2DM, HTN, HLD, gastroparesis, CAD who presents to the ED with right 4th digit osteomyelitis.  He's s/p previous partial amputation of R 4th finger, but is being admitted now for further evaluation by vascular and orthopedics inpatient.  Hospitalization from 01/29/2021 to 01/30/2021: Per discharge summary, patient presented for right fourth digit wound dehiscence at previous amputation site.  Patient did not have fever, swelling, bleeding.  Vascular was consulted and recommended further amputation to aid with wound healing as the wound is necrotic and will need debridement.  Surgery was scheduled for days out so patient is to be discharged home and will be set up to complete procedure as an outpatient on 02/03/2021.  He was discharged home with Augmentin and doxycycline until after surgery.    Assessment & Plan:   Principal Problem:   Wound infection Active Problems:   Hypertensive chronic kidney disease with stage 1 through stage 4 chronic kidney disease, or unspecified chronic kidney disease   Major depressive disorder, recurrent, unspecified (HCC)   Moderate protein-calorie malnutrition (HCC)   Secondary hyperparathyroidism of renal origin ([place])   Reactive depression   Gangrene of finger of right hand ([place])   Wound dehiscence, surgical, initial encounter   Osteomyelitis of finger of right hand ([place])   ESRD on hemodialysis ([place])   Hyperphosphatemia   Metabolic acidosis   Hyperkalemia   Hypermagnesemia   Mixed hyperlipidemia   Essential hypertension, benign   Coronary artery disease   Blindness of left eye   AF (paroxysmal atrial fibrillation) (HCC)   Gastroparesis   Iron deficiency anemia, unspecified   Osteomyelitis of finger of right hand (HCC) Sed rate 97 CRP 3.6 Negative MRSA  PCR Vancomycin/cefepime Plain films 12/14 with new destructive changes involving base of ring finger proximal phalanx compatible with acute osteo S/p right upper extremity angiogram with angioplasty of distal right ulnar artery and proximal right radial artery Appreciate vascular and orthopedic recommendations (pending ortho eval)  ESRD on hemodialysis ([place]) Missed Hx Tuesday Last HD 12/8 Hyperkalemia today, plan for dialysis today  Hyperphosphatemia Per renal with esrd, need for dialysis  Hypermagnesemia Electrolyte abnormalities per renal  Hyperkalemia Dialysis today  Metabolic acidosis Related to ESRD Per renal  Mixed hyperlipidemia statin  Coronary artery disease Statin, coreg  Essential hypertension, benign coreg  AF (paroxysmal atrial fibrillation) ([place]) Continue amiodarone, coreg Discharged on eliquis on 11/18, but looks like anticoagulation has fallen off (not on 12/9 d/c summary)? Unclear why to me?  Plavix is on med rec (listed as being held for surgery)  Will discuss with pharmacy.  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 Ultrasound guidance vascular access to right femoral artery. 2. Catheter placement to right radial artery and right ulnar arteries     from right femoral approach. 3. Thoracic aortogram and selective right upper extremity angiogram  including selective images of the radial and ulnar arteries. 4.  Percutaneous transluminal of distal right  ulnar artery with 2.5 mm diameter angioplasty balloon 5.  Percutaneous transluminal angioplasty of the proximal right radial artery with 3 mm diameter angioplasty balloon 5. StarClose closure device right femoral artery.  Antimicrobials:  Anti-infectives (From admission, onward)    Start     Dose/Rate Route Frequency Ordered Stop   02/05/21 1800  ceFEPIme (MAXIPIME) 1 g in sodium chloride 0.9 % 100 mL IVPB        1 g 200 mL/hr over 30 Minutes Intravenous Every 24 hours 02/05/21 1126     02/05/21 1400  vancomycin (VANCOCIN) 1,000 mg in sodium chloride 0.9 % 150 mL IVPB  Status:  Discontinued        1,000 mg 150 mL/hr over 60 Minutes Intravenous Every Dialysis 02/05/21 1126 02/05/21 1306   02/05/21 1400  vancomycin (VANCOCIN) IVPB 1000 mg/200 mL premix        1,000 mg 200 mL/hr over 60 Minutes Intravenous Every T-Th-Sa (Hemodialysis) 02/05/21 1306     02/05/21 0815  ceFAZolin (ANCEF) IVPB 2g/100 mL premix        2 g 200 mL/hr over 30 Minutes Intravenous On call to O.R. 02/05/21 0759 02/05/21 0931   02/05/21 0012  vancomycin variable dose per unstable renal function (pharmacist dosing)  Status:  Discontinued         Does not apply See admin instructions 02/05/21 0013 02/05/21 1126       Subjective: Feels ok postop Wife and pastor at bedside  Objective: Vitals:   02/05/21 1030 02/05/21 1045 02/05/21 1057 02/05/21 1128  BP: (!) 141/72 140/74 (!) 142/74 (!) 163/77  Pulse: 71 67 69 67  Resp: 18 19 13 18   Temp:    97.9 F (36.6 C)  TempSrc:      SpO2: 95% 93% 96% 99%    Intake/Output Summary (Last 24 hours) at 02/05/2021 1434 Last data filed at 02/05/2021 1408 Gross per 24 hour  Intake 440 ml  Output --  Net 440 ml   There were no vitals filed for this visit.  Examination:  General exam: Appears calm and comfortable  Respiratory system: unlabored Cardiovascular system: RRR Gastrointestinal system: Abdomen is nondistended, soft and nontender Central nervous system:  Alert and oriented. No focal neurological deficits. Extremities: RUE fistula with thrill, r hand in dressing Skin: No rashes, lesions or ulcers Psychiatry: Judgement and insight appear normal. Mood & affect appropriate.     Data Reviewed: I have personally reviewed following labs and imaging studies  CBC: Recent Labs  Lab 01/30/21 0412 02/04/21 1628 02/05/21 0143  WBC 10.9* 10.8* 12.6*  NEUTROABS  --  7.5  --   HGB 7.2* 7.4* 7.2*  HCT 23.8* 25.5* 23.5*  MCV 76.8* 81.0 78.3*  PLT 242 270 696    Basic Metabolic Panel: Recent Labs  Lab 01/30/21 0412 02/04/21 1628 02/04/21 2340 02/05/21 0143  NA 133* 149*  --  146*  K 3.2* 5.3*  --  5.8*  CL 100 121*  --  120*  CO2 22 10*  --  10*  GLUCOSE 120* 70  --  85  BUN 49* 79*  --  82*  CREATININE 9.15* 17.99*  --  17.53*  CALCIUM 7.7* 8.5*  --  8.3*  MG  --   --  2.5*  --   PHOS  --   --  11.5*  --     GFR: Estimated Creatinine Clearance:  5.9 mL/min ([place]) (by C-G formula based on SCr of 17.53 mg/dL (H)).  Liver Function Tests: Recent Labs  Lab 02/04/21 1628  AST 11*  ALT 14  ALKPHOS 64  BILITOT 0.2*  PROT 7.0  ALBUMIN 3.0*    CBG: Recent Labs  Lab 01/29/21 2157 01/30/21 0801 01/30/21 1209  GLUCAP 171* 80 81     Recent Results (from the past 240 hour(s))  Body fluid culture w Gram Stain     Status: None   Collection Time: 01/29/21  1:06 AM   Specimen: Hand; Body Fluid  Result Value Ref Range Status   Specimen Description   Final    HAND LEFT Performed at [facility], [address] [place], [place] [address]    Special Requests   Final    NONE Performed at [facility], [place], [place] [address]    Gram Stain   Final    ABUNDANT WBC PRESENT, PREDOMINANTLY PMN MODERATE GRAM POSITIVE COCCI MODERATE GRAM NEGATIVE RODS RARE GRAM POSITIVE RODS Performed at [facility], [place] [address] [place], [place] [address]    Culture   Final    MODERATE CITROBACTER  YOUNGAE FEW PROTEUS MIRABILIS MIXED ANAEROBIC FLORA PRESENT.  CALL LAB IF FURTHER IID REQUIRED.    Report Status 01/31/2021 FINAL  Final   Organism ID, Bacteria CITROBACTER YOUNGAE  Final   Organism ID, Bacteria PROTEUS MIRABILIS  Final      Susceptibility   Citrobacter youngae - MIC*    CEFAZOLIN >=64 RESISTANT Resistant     CEFEPIME <=0.12 SENSITIVE Sensitive     CEFTAZIDIME <=1 SENSITIVE Sensitive     CEFTRIAXONE <=0.25 SENSITIVE Sensitive     CIPROFLOXACIN <=0.25 SENSITIVE Sensitive     GENTAMICIN <=1 SENSITIVE Sensitive     IMIPENEM <=0.25 SENSITIVE Sensitive     TRIMETH/SULFA <=20 SENSITIVE Sensitive     PIP/TAZO <=4 SENSITIVE Sensitive     * MODERATE CITROBACTER YOUNGAE   Proteus mirabilis - MIC*    AMPICILLIN <=2 SENSITIVE Sensitive     CEFAZOLIN <=4 SENSITIVE Sensitive     CEFEPIME <=0.12 SENSITIVE Sensitive     CEFTAZIDIME <=1 SENSITIVE Sensitive     CEFTRIAXONE <=0.25 SENSITIVE Sensitive     CIPROFLOXACIN <=0.25 SENSITIVE Sensitive     GENTAMICIN <=1 SENSITIVE Sensitive     IMIPENEM 2 SENSITIVE Sensitive     TRIMETH/SULFA <=20 SENSITIVE Sensitive     AMPICILLIN/SULBACTAM <=2 SENSITIVE Sensitive     PIP/TAZO <=4 SENSITIVE Sensitive     * FEW PROTEUS MIRABILIS  Resp Panel by RT-PCR (Flu Juquan Reznick&B, Covid) Hand, Left     Status: None   Collection Time: 01/29/21  1:06 AM   Specimen: Hand, Left; Nasopharyngeal(NP) swabs in vial transport medium  Result Value Ref Range Status   SARS Coronavirus 2 by RT PCR NEGATIVE NEGATIVE Final    Comment: (NOTE) SARS-CoV-2 target nucleic acids are NOT DETECTED.  The SARS-CoV-2 RNA is generally detectable in upper respiratory specimens during the acute phase of infection. The lowest concentration of SARS-CoV-2 viral copies this assay can detect is 138 copies/mL. Delorse Shane negative result does not preclude SARS-Cov-2 infection and should not be used as the sole basis for treatment or other patient management decisions. Makiyah Zentz negative result may occur  with  improper specimen collection/handling, submission of specimen other than nasopharyngeal swab, presence of viral mutation(s) within the areas targeted by this assay, and inadequate number of viral copies(<138 copies/mL). Arfa Lamarca negative result must be  combined with clinical observations, patient history, and epidemiological information. The expected result is Negative.  Fact Sheet for Patients:  EntrepreneurPulse.com.au  Fact Sheet for Healthcare Providers:  IncredibleEmployment.be  This test is no t yet approved or cleared by the [place] FDA and  has been authorized for detection and/or diagnosis of SARS-CoV-2 by FDA under an Emergency Use Authorization (EUA). This EUA will remain  in effect (meaning this test can be used) for the duration of the COVID-19 declaration under Section 564(b)(1) of the Act, 21 U.S.C.section 360bbb-3(b)(1), unless the authorization is terminated  or revoked sooner.       Influenza Renay Crammer by PCR NEGATIVE NEGATIVE Final   Influenza B by PCR NEGATIVE NEGATIVE Final    Comment: (NOTE) The Xpert Xpress SARS-CoV-2/FLU/RSV plus assay is intended as an aid in the diagnosis of influenza from Nasopharyngeal swab specimens and should not be used as Sayvon Arterberry sole basis for treatment. Nasal washings and aspirates are unacceptable for Xpert Xpress SARS-CoV-2/FLU/RSV testing.  Fact Sheet for Patients: EntrepreneurPulse.com.au  Fact Sheet for Healthcare Providers: IncredibleEmployment.be  This test is not yet approved or cleared by the [place] FDA and has been authorized for detection and/or diagnosis of SARS-CoV-2 by FDA under an Emergency Use Authorization (EUA). This EUA will remain in effect (meaning this test can be used) for the duration of the COVID-19 declaration under Section 564(b)(1) of the Act, 21 U.S.C. section 360bbb-3(b)(1), unless the authorization is terminated  or revoked.  Performed at [facility], [address] [place], [place] [address]   Culture, blood (routine x 2)     Status: None (Preliminary result)   Collection Time: 02/04/21  4:28 PM   Specimen: BLOOD  Result Value Ref Range Status   Specimen Description BLOOD BLOOD LEFT HAND  Final   Special Requests   Final    BOTTLES DRAWN AEROBIC AND ANAEROBIC Blood Culture results may not be optimal due to an excessive volume of blood received in culture bottles Performed at [facility], [address], [place], [place] [address]    Culture PENDING  Incomplete   Report Status PENDING  Incomplete  Culture, blood (routine x 2)     Status: None (Preliminary result)   Collection Time: 02/04/21  4:30 PM   Specimen: BLOOD  Result Value Ref Range Status   Specimen Description BLOOD BLOOD LEFT FOREARM  Final   Special Requests   Final    BOTTLES DRAWN AEROBIC AND ANAEROBIC Blood Culture adequate volume Performed at [facility], [address], [place], [place] [address]    Culture PENDING  Incomplete   Report Status PENDING  Incomplete  Resp Panel by RT-PCR (Flu Jemia Fata&B, Covid) Nasopharyngeal Swab     Status: None   Collection Time: 02/04/21  4:30 PM   Specimen: Nasopharyngeal Swab; Nasopharyngeal(NP) swabs in vial transport medium  Result Value Ref Range Status   SARS Coronavirus 2 by RT PCR NEGATIVE NEGATIVE Final    Comment: (NOTE) SARS-CoV-2 target nucleic acids are NOT DETECTED.  The SARS-CoV-2 RNA is generally detectable in upper respiratory specimens during the acute phase of infection. The lowest concentration of SARS-CoV-2 viral copies this assay can detect is 138 copies/mL. Daemon Dowty negative result does not preclude SARS-Cov-2 infection and should not be used as the sole basis for treatment or other patient management decisions. Dmarion Perfect negative result may occur with  improper specimen collection/handling, submission of specimen other than nasopharyngeal swab, presence of viral  mutation(s) within the areas targeted by this assay,  and inadequate number of viral copies(<138 copies/mL). Nancyjo Givhan negative result must be combined with clinical observations, patient history, and epidemiological information. The expected result is Negative.  Fact Sheet for Patients:  EntrepreneurPulse.com.au  Fact Sheet for Healthcare Providers:  IncredibleEmployment.be  This test is no t yet approved or cleared by the [place] FDA and  has been authorized for detection and/or diagnosis of SARS-CoV-2 by FDA under an Emergency Use Authorization (EUA). This EUA will remain  in effect (meaning this test can be used) for the duration of the COVID-19 declaration under Section 564(b)(1) of the Act, 21 U.S.C.section 360bbb-3(b)(1), unless the authorization is terminated  or revoked sooner.       Influenza Clifton Safley by PCR NEGATIVE NEGATIVE Final   Influenza B by PCR NEGATIVE NEGATIVE Final    Comment: (NOTE) The Xpert Xpress SARS-CoV-2/FLU/RSV plus assay is intended as an aid in the diagnosis of influenza from Nasopharyngeal swab specimens and should not be used as Alajah Witman sole basis for treatment. Nasal washings and aspirates are unacceptable for Xpert Xpress SARS-CoV-2/FLU/RSV testing.  Fact Sheet for Patients: EntrepreneurPulse.com.au  Fact Sheet for Healthcare Providers: IncredibleEmployment.be  This test is not yet approved or cleared by the [place] FDA and has been authorized for detection and/or diagnosis of SARS-CoV-2 by FDA under an Emergency Use Authorization (EUA). This EUA will remain in effect (meaning this test can be used) for the duration of the COVID-19 declaration under Section 564(b)(1) of the Act, 21 U.S.C. section 360bbb-3(b)(1), unless the authorization is terminated or revoked.  Performed at [facility], [address] [place], [place] [address]   CULTURE, BLOOD (ROUTINE X 2) w Reflex to ID  Panel     Status: None (Preliminary result)   Collection Time: 02/04/21 11:40 PM   Specimen: BLOOD  Result Value Ref Range Status   Specimen Description BLOOD LEFT ASSIST CONTROL  Final   Special Requests   Final    BOTTLES DRAWN AEROBIC AND ANAEROBIC Blood Culture adequate volume   Culture   Final    NO GROWTH < 12 HOURS Performed at [facility], [address], [place], [place] [address]    Report Status PENDING  Incomplete  CULTURE, BLOOD (ROUTINE X 2) w Reflex to ID Panel     Status: None (Preliminary result)   Collection Time: 02/04/21 11:40 PM   Specimen: BLOOD  Result Value Ref Range Status   Specimen Description BLOOD LEFT HAND  Final   Special Requests   Final    BOTTLES DRAWN AEROBIC AND ANAEROBIC Blood Culture adequate volume   Culture   Final    NO GROWTH < 12 HOURS Performed at [facility], [address] [place], [place] [address]    Report Status PENDING  Incomplete  MRSA Next Gen by PCR, Nasal     Status: None   Collection Time: 02/05/21  1:24 AM   Specimen: Nasal Mucosa; Nasal Swab  Result Value Ref Range Status   MRSA by PCR Next Gen NOT DETECTED NOT DETECTED Final    Comment: (NOTE) The GeneXpert MRSA Assay (FDA approved for NASAL specimens only), is one component of Pistol Kessenich comprehensive MRSA colonization surveillance program. It is not intended to diagnose MRSA infection nor to guide or monitor treatment for MRSA infections. Test performance is not FDA approved in patients less than 15 years old. Performed at [facility], [address], [place], [place] [address]          Radiology Studies: PERIPHERAL VASCULAR  CATHETERIZATION  Result Date: 02/05/2021 See surgical note for result.  DG Hand Complete Right  Result Date: 02/04/2021 CLINICAL DATA:  Right hand wound, fever.  Surgery in October of 2022 EXAM: RIGHT HAND - COMPLETE 3+ VIEW COMPARISON:  01/28/2021 FINDINGS: Postsurgical changes from ring finger amputation at  the level of the proximal phalanx. New destructive changes involving the base of the ring finger proximal phalanx compatible with acute osteomyelitis. No fracture or dislocation. Osseous structures are demineralized. Severe small vessel atherosclerotic calcifications. Prominent soft tissue swelling of the hand. IMPRESSION: New destructive changes involving the base of the ring finger proximal phalanx compatible with acute osteomyelitis. Electronically Signed   By: Davina Poke D.O.   On: 02/04/2021 16:49        Scheduled Meds:  amiodarone  200 mg Oral Daily   atorvastatin  80 mg Oral QHS   calcium acetate  2,001 mg Oral TID WC   carvedilol  25 mg Oral BID WC   fentaNYL       fentaNYL       heparin sodium (porcine)       midazolam       Continuous Infusions:  ceFEPime (MAXIPIME) IV     vancomycin       LOS: 1 day    Time spent: over 30 min    Fayrene Helper, MD [facility]   To contact the attending provider between 7A-7P or the covering provider during after hours 7P-7A, please log into the web site [email or web address] and access using universal [place] password for that web site. If you do not have the password, please call the hospital operator.  02/05/2021, 2:34 PM

## 2021-02-05 NOTE — Assessment & Plan Note (Signed)
Per renal with esrd, need for dialysis

## 2021-02-05 NOTE — Interval H&P Note (Signed)
History and Physical Interval Note:  02/05/2021 8:53 AM  [name].  has presented today for surgery, with the diagnosis of RUE Angio  Steal syndrome.  The various methods of treatment have been discussed with the patient and family. After consideration of risks, benefits and other options for treatment, the patient has consented to  Procedure(s): UPPER EXTREMITY ANGIOGRAPHY (Right) as a surgical intervention.  The patient's history has been reviewed, patient examined, no change in status, stable for surgery.  I have reviewed the patient's chart and labs.  Questions were answered to the patient's satisfaction.     [name]

## 2021-02-05 NOTE — H&P (View-Only) (Signed)
Subjective:    Patient ID: [name]., male    DOB: [birth year], 50 y.o.   MRN: [id number] Chief Complaint  Patient presents with   Follow-up    Ultrasound follow up    [name], [name]. is a 50 year old male that presents today due to concern for steal syndrome in his right upper extremity.  The patient previously had banding done on 11/27/2020 following gangrenous changes to his fourth finger.  Since that time the finger has been amputated but the patient continues to have significant foul-smelling drainage and pain.  He also notes that his hand swelling excessively.  He notes that he is supposed to have upcoming hand surgery however the surgery was apprehensive to proceed without vascular involvement due to concern for being able to heal any further open wounds or ulcerations.  Today the patient did have drainage from the wound and is foul-smelling.  He denies any further open wounds or ulcerations.  Today noninvasive studies show flow volume of 2618.  No areas of significant stenosis noted.   Review of Systems  Musculoskeletal:  Positive for myalgias.  Skin:  Positive for color change and wound.  Neurological:  Positive for numbness.  All other systems reviewed and are negative.     Objective:   Physical Exam Vitals reviewed.  HENT:     Head: Normocephalic.  Cardiovascular:     Rate and Rhythm: Normal rate.     Pulses:          Radial pulses are 0 on the right side.  Neurological:     Mental Status: He is alert.    BP (!) 143/73 (BP Location: Left Arm)    Pulse 66    Resp 16    Wt 201 lb 9.6 oz (91.4 kg)    BMI 25.20 kg/m   Past Medical History:  Diagnosis Date   Anxiety    Cellulitis    CHF (congestive heart failure) (HCC)    Chronic diarrhea    Chronic kidney disease    Chronic pain of both ankles    Diabetes mellitus without complication (HCC)    Diabetes mellitus, type II (HCC)    Diabetic neuropathy (HCC)    Frequent falls    Gait instability     GERD (gastroesophageal reflux disease)    Heart palpitations    Hemodialysis patient ([place])    History of kidney stones    HLD (hyperlipidemia)    Hypertension    Insomnia    Nausea and vomiting in adult    recurrent    Social History   Socioeconomic History   Marital status: Married    Spouse name: [name]    Number of children: 2   Years of education: Not on file   Highest education level: High school graduate  Occupational History   Occupation: Disability    Comment: not employed  Tobacco Use   Smoking status: Never   Smokeless tobacco: Never  Vaping Use   Vaping Use: Never used  Substance and Sexual Activity   Alcohol use: No   Drug use: No   Sexual activity: Yes  Other Topics Concern   Not on file  Social History Narrative   Oldest son killed in car crash June 2020.    Social Determinants of Health   Financial Resource Strain: Not on file  Food Insecurity: Not on file  Transportation Needs: No Transportation Needs   Lack of Transportation (Medical): No   Lack of Transportation (Non-Medical): No  Physical Activity: Not on file  Stress: Not on file  Social Connections: Not on file  Intimate Partner Violence: Not on file    Past Surgical History:  Procedure Laterality Date   AMPUTATION Left 03/16/2016   Procedure: [place];  Surgeon: Trula Slade, DPM;  Location: [place];  Service: Podiatry;  Laterality: Left;  can start around 3    ARTHROSCOPIC REPAIR ACL Left    AV FISTULA PLACEMENT Right 05/31/2019   Procedure: Brachiocephalic AV fistula creation;  Surgeon: Algernon Huxley, MD;  Location: [name];  Service: Vascular;  Laterality: Right;   COLONOSCOPY WITH PROPOFOL N/A 10/28/2015   Procedure: COLONOSCOPY WITH PROPOFOL;  Surgeon: Lollie Sails, MD;  Location: [facility] ENDOSCOPY;  Service: Endoscopy;  Laterality: N/A;   COLONOSCOPY WITH PROPOFOL N/A 10/29/2015   Procedure: COLONOSCOPY WITH PROPOFOL;  Surgeon: Lollie Sails, MD;  Location:  [facility] ENDOSCOPY;  Service: Endoscopy;  Laterality: N/A;   CORONARY/GRAFT ACUTE MI REVASCULARIZATION N/A 02/26/2020   Procedure: Coronary/Graft Acute MI Revascularization;  Surgeon: Jettie Booze, MD;  Location: [place] [facility];  Service: Cardiovascular;  Laterality: N/A;   DIALYSIS/PERMA CATHETER INSERTION Right 04/26/2019   Perm Cath    DIALYSIS/PERMA CATHETER INSERTION N/A 04/26/2019   Procedure: DIALYSIS/PERMA CATHETER INSERTION;  Surgeon: Algernon Huxley, MD;  Location: [place] [facility];  Service: Cardiovascular;  Laterality: N/A;   DIALYSIS/PERMA CATHETER REMOVAL N/A 08/15/2019   Procedure: DIALYSIS/PERMA CATHETER REMOVAL;  Surgeon: Katha Cabal, MD;  Location: [place] [facility];  Service: Cardiovascular;  Laterality: N/A;   ESOPHAGOGASTRODUODENOSCOPY (EGD) WITH PROPOFOL N/A 12/27/2017   Procedure: ESOPHAGOGASTRODUODENOSCOPY (EGD) WITH PROPOFOL;  Surgeon: [place], Benay Pike, MD;  Location: [name] ENDOSCOPY;  Service: Gastroenterology;  Laterality: N/A;   INTRAVASCULAR ULTRASOUND/IVUS N/A 02/26/2020   Procedure: Intravascular Ultrasound/IVUS;  Surgeon: Jettie Booze, MD;  Location: [place] [facility];  Service: Cardiovascular;  Laterality: N/A;   LEFT HEART CATH AND CORONARY ANGIOGRAPHY N/A 02/26/2020   Procedure: LEFT HEART CATH AND CORONARY ANGIOGRAPHY;  Surgeon: Jettie Booze, MD;  Location: [place] [facility];  Service: Cardiovascular;  Laterality: N/A;   PROSTATE SURGERY  2016   TONSILECTOMY/ADENOIDECTOMY WITH MYRINGOTOMY     TONSILLECTOMY     UPPER EXTREMITY ANGIOGRAPHY Right 11/26/2020   Procedure: Upper Extremity Angiography;  Surgeon: Algernon Huxley, MD;  Location: [place] [facility];  Service: Cardiovascular;  Laterality: Right;    Family History  Problem Relation Age of Onset   CAD Father    Stroke Father    Diabetes Mellitus II Mother    Kidney failure Mother    Schizophrenia Mother     Allergies  Allergen Reactions   Levofloxacin Swelling    Of  face Other reaction(s): swelling   Promethazine Diarrhea and Other (See Comments)    Other reaction(s): Other (See Comments), Other (See Comments) Other reaction(s): Muscle cramps Muscle cramps Cramping Other reaction(s): Muscle cramps Muscle cramps   Other     Other reaction(s): Unknown   Malt Other (See Comments)    Other reaction(s): cramping    CBC Latest Ref Rng & Units 02/04/2021 01/30/2021 01/29/2021  WBC 4.0 - 10.5 K/uL 10.8(H) 10.9(H) 10.3  Hemoglobin 13.0 - 17.0 g/dL 7.4(L) 7.2(L) 7.7(L)  Hematocrit 39.0 - 52.0 % 25.5(L) 23.8(L) 25.3(L)  Platelets 150 - 400 K/uL 270 242 240      CMP     Component Value Date/Time   NA 149 (H) 02/04/2021 1628   K  5.3 (H) 02/04/2021 1628   CL 121 (H) 02/04/2021 1628   CO2 10 (L) 02/04/2021 1628   GLUCOSE 70 02/04/2021 1628   BUN 79 (H) 02/04/2021 1628   CREATININE 17.99 (H) 02/04/2021 1628   CALCIUM 8.5 (L) 02/04/2021 1628   PROT 7.0 02/04/2021 1628   ALBUMIN 3.0 (L) 02/04/2021 1628   AST 11 (L) 02/04/2021 1628   ALT 14 02/04/2021 1628   ALKPHOS 64 02/04/2021 1628   BILITOT 0.2 (L) 02/04/2021 1628   GFRNONAA 3 (L) 02/04/2021 1628   GFRAA 5 (L) 08/27/2019 1540     No results found.     Assessment & Plan:   1. ESRD on dialysis [facility]) Today following the banding the patient previously had he still has a flow volume of 2618 present in his right brachiocephalic AV fistula.  Previous angiogram prior to his banding showed an occluded ulnar artery with sluggish flow from the radial artery.  The patient's finger amputation has not been healing well and is causing him significant pain with evidence of infection.  The orthopedic hand surgeon is waiting for intervention to see if he would possibly heal a finger amputation, or more invasive surgery.  In order to determine if some sort of endovascular intervention is possible the patient will require an upper extremity angiogram.  It is possible that the patient may also require ligation of  his AV fistula in order to stop steal syndrome history to be present.  The risk, benefits and alternatives discussed with patient he agrees to proceed with intervention. 2. Uncontrolled type 2 diabetes mellitus with hyperglycemia, with long-term current use of insulin (HCC) Continue hypoglycemic medications as already ordered, these medications have been reviewed and there are no changes at this time.  Hgb A1C to be monitored as already arranged by primary service   3. Essential hypertension, benign Continue antihypertensive medications as already ordered, these medications have been reviewed and there are no changes at this time.   4. Mixed hyperlipidemia Continue statin as ordered and reviewed, no changes at this time    No current facility-administered medications on file prior to visit.   Current Outpatient Medications on File Prior to Visit  Medication Sig Dispense Refill   acetaminophen (TYLENOL) 325 MG tablet Take 2 tablets (650 mg total) by mouth every 6 (six) hours as needed for mild pain (or Fever >/= 101).     amiodarone (PACERONE) 200 MG tablet Take 1 tablet (200 mg total) by mouth daily. 1 twice daily for 2 weeks, then 1 daily 30 tablet 1   amoxicillin-clavulanate (AUGMENTIN) 500-125 MG tablet Take 1 tablet (500 mg total) by mouth daily for 7 days. Must be taken AFTER dialysis on dialysis days. (Patient not taking: Reported on 02/04/2021) 7 tablet 0   atorvastatin (LIPITOR) 80 MG tablet TAKE 1 TABLET (80 MG TOTAL) BY MOUTH AT BEDTIME. 90 tablet 0   azelastine (ASTELIN) 0.1 % nasal spray Place 2 sprays into both nostrils 2 (two) times daily.     calcium acetate (PHOSLO) 667 MG capsule Take 3 capsules (2,001 mg total) by mouth 3 (three) times daily with meals. 100 capsule 0   carvedilol (COREG) 25 MG tablet Take 1 tablet (25 mg total) by mouth 2 (two) times daily with a meal. 60 tablet 1   clopidogrel (PLAVIX) 75 MG tablet Take 1 tablet (75 mg total) by mouth daily. 90 tablet 3    doxycycline (ADOXA) 100 MG tablet Take 1 tablet (100 mg total) by mouth 2 (  two) times daily for 7 days. 14 tablet 0   FEROSUL 325 (65 Fe) MG tablet Take 325 mg by mouth daily.     fluticasone (FLONASE) 50 MCG/ACT nasal spray Place 2 sprays into both nostrils daily. (Patient not taking: Reported on 02/04/2021)     HYDROcodone-acetaminophen (NORCO/VICODIN) 5-325 MG tablet Take 2 tablets by mouth 2 (two) times daily.     lidocaine-prilocaine (EMLA) cream Apply 1 application topically Every Tuesday,Thursday,and Saturday with dialysis.  (Patient not taking: Reported on 02/04/2021)     losartan (COZAAR) 25 MG tablet Take 25 mg by mouth daily. (Patient not taking: Reported on 02/04/2021)     nitroGLYCERIN (NITROSTAT) 0.4 MG SL tablet Place 1 tablet (0.4 mg total) under the tongue every 5 (five) minutes as needed for chest pain.     prednisoLONE acetate (PRED FORTE) 1 % ophthalmic suspension Place 1 drop into the left eye every 4 (four) hours.     tamsulosin (FLOMAX) 0.4 MG CAPS capsule Take 0.4 mg by mouth daily after breakfast.       There are no Patient Instructions on file for this visit. No follow-ups on file.   Kris Hartmann, NP

## 2021-02-05 NOTE — Progress Notes (Signed)
°   02/05/21 1930  Assess: MEWS Score  Temp 100.1 F (37.8 C)  BP (!) 255/73  Pulse Rate 83  Resp 15  SpO2 99 %  O2 Device Room Air  Assess: MEWS Score  MEWS Temp 0  MEWS Systolic 2  MEWS Pulse 0  MEWS RR 0  MEWS LOC 0  MEWS Score 2  MEWS Score Color Yellow  Assess: if the MEWS score is Yellow or Red  Were vital signs taken at a resting state? Yes  Focused Assessment Change from prior assessment (see assessment flowsheet)  Does the patient meet 2 or more of the SIRS criteria? No  MEWS guidelines implemented *See Row Information* Yes  Treat  MEWS Interventions Administered scheduled meds/treatments;Administered prn meds/treatments  Pain Scale 0-10  Pain Score 0  Take Vital Signs  Increase Vital Sign Frequency  Yellow: Q 2hr X 2 then Q 4hr X 2, if remains yellow, continue Q 4hrs  Escalate  MEWS: Escalate Yellow: discuss with charge nurse/RN and consider discussing with provider and RRT  Notify: Charge Nurse/RN  Name of Charge Nurse/RN Notified [place]  Date Charge Nurse/RN Notified 02/05/21  Time Charge Nurse/RN Notified 2030  Assess: SIRS CRITERIA  SIRS Temperature  0  SIRS Pulse 0  SIRS Respirations  0  SIRS WBC 0  SIRS Score Sum  0

## 2021-02-05 NOTE — Assessment & Plan Note (Addendum)
Resolved with HD

## 2021-02-05 NOTE — Progress Notes (Signed)
Patient attends HD session, request UF be turned off, Dr. Candiss Norse at bedside, instructs to turn off. No further fluid removal this session.

## 2021-02-05 NOTE — Plan of Care (Signed)
  Problem: Education: Goal: Knowledge of General Education information will improve Description Including pain rating scale, medication(s)/side effects and non-pharmacologic comfort measures Outcome: Progressing   

## 2021-02-05 NOTE — Op Note (Signed)
OPERATIVE REPORT     PREOPERATIVE DIAGNOSIS: 1. End-stage renal disease. 2. Steal syndrome, Right arm with patent right brachiocephalic AV fistula status post banding. 3.  Osteomyelitis right hand   POSTOPERATIVE DIAGNOSIS: Same as above   PROCEDURE PERFORMED: 1. Ultrasound guidance vascular access to right femoral artery. 2. Catheter placement to right radial artery and right ulnar arteries     from right femoral approach. 3. Thoracic aortogram and selective right upper extremity angiogram     including selective images of the radial and ulnar arteries. 4.  Percutaneous transluminal of distal right ulnar artery with 2.5 mm diameter angioplasty balloon 5.  Percutaneous transluminal angioplasty of the proximal right radial artery with 3 mm diameter angioplasty balloon 5. StarClose closure device right femoral artery.   SURGEON:  Algernon Huxley, MD   ANESTHESIA:  Local with moderate conscious sedation for 55 minutes using 3 mg of Versed and 75 mcg of Fentanyl   BLOOD LOSS:  Minimal.   FLUOROSCOPY TIME: 9.5   INDICATION FOR PROCEDURE:  This is a 50 y.o.male who presented to our office with steal syndrome.  The patient's right hand has osteomyelitis.  He has already had banding of the right arm AV fistula.  His fistula is working well, but their hand is numb and painful and there is ulceration and infection present..  To further evaluate this to determine what options would be possible to treat the steal syndrome, angiogram of the left upper extremity is indicated.  Risks and benefits are discussed.  Informed consent was obtained.   DESCRIPTION OF PROCEDURE:  The patient was brought to the vascular suite.  Moderate conscious sedation was administered during a face to face encounter with the patient throughout the procedure with my supervision of the RN administering medicines and monitoring the patient's vital signs, pulse oximetry, telemetry and mental status throughout from the start  of the procedure until the patient was taken to the recovery room.  Groins were shaved and prepped and sterile surgical field was created.  The right femoral head was localized with fluoroscopy and the right femoral artery was then visualized with ultrasound and found to be widely patent.  It was then accessed under direct ultrasound guidance without difficulty with a Seldinger needle and a permanent image was recorded.  A J-wire and 5-French sheath were then placed.  Pigtail catheter was placed into the ascending aorta and a thoracic aortogram was then performed in the LAO projection. This demonstrated normal origins to the great vessels without significant proximal stenoses and a normal configuration of the great vessels.  The patient was given 3000 units of intravenous heparin and a H2 catheter was used to selectively cannulate the innominate artery and then advanced into the right subclavian artery without difficulty.  This was then sequentially advanced to the brachial artery and to the brachial bifurcation.  Steal was demonstrated with all of the flow from the artery going into the fistula and not downstream to the hand on images with the catheter proximal to the access. I then advanced a catheter beyond the access and into the radial and ulnar arteries.  Ulnar artery was entered first.  This was a small vessel and was occluded in the distal ulnar artery in the forearm and across the wrist with essentially no filling of the hand on the initial image.  Then cannulated the radial artery with a seeker catheter and the Glidewire.  With images at the origin of the radial artery, essentially no flow went  downstream and it was difficult to discern if there was a stenosis or occlusion of the proximal radial artery versus severe steal.  Even with the catheter well beyond the origin there was inline flow to the hand from the mid and distal radial artery and all 5 fingers were filled artery.  With the catheter  in the mid radial artery there was not retrograde flow and there was concern that there was a significant stenosis at the proximal radial artery although it was difficult to see.  We then exchanged for the 018 wire to treat radial artery.  A 3 mm diameter by 10 cm length angioplasty balloon was inflated in the proximal radial artery up to 8 atm for 1 minute.  Completion imaging showed no significant stenosis after treatment, but the catheter was proximal to the radial artery, severe steal was still present.  I then used a 0.014 advantage wire to get into the ulnar artery and get into the hand.  We then used a 2.5 mm diameter by 10 cm length rapid exchange balloon to treat the distal ulnar artery and this was inflated to 10 atm for 1 minute in the distal forearm and wrist area.  Completion imaging following this showed now some small amount of blood flow getting into the hand from a direct ulnar artery injection although in the small vessels in the hand there was still greater than 50% stenosis.  There is no further endovascular option for treating this.  Of note, on the imaging, AV fistula and banded already had about a 90% stenosis at the area of banding and so the only other option for treatment of his steal syndrome from a vascular standpoint would now be ligation of the AV fistula. The diagnostic catheter was removed.  Oblique arteriogram was performed of the right femoral artery and StarClose closure device deployed in the usual fashion with excellent hemostatic result. The patient tolerated the procedure well and was taken to the recovery room in stable condition.    [name] 02/05/2021 10:16 AM

## 2021-02-05 NOTE — Assessment & Plan Note (Addendum)
   Improved with dialysis. 

## 2021-02-05 NOTE — H&P (View-Only) (Signed)
PROGRESS NOTE    [name].  ZJQ:[id number] DOB: 12/02/1970 DOA: 02/04/2021 PCP: [name], MD  No chief complaint on file.   Brief Narrative:  50 yo with hx ESRD (T/Th), HTN, T2DM, HTN, HLD, gastroparesis, CAD who presents to the ED with right 4th digit osteomyelitis.  He's s/p previous partial amputation of R 4th finger, but is being admitted now for further evaluation by vascular and orthopedics inpatient.  Hospitalization from 01/29/2021 to 01/30/2021: Per discharge summary, patient presented for right fourth digit wound dehiscence at previous amputation site.  Patient did not have fever, swelling, bleeding.  Vascular was consulted and recommended further amputation to aid with wound healing as the wound is necrotic and will need debridement.  Surgery was scheduled for days out so patient is to be discharged home and will be set up to complete procedure as an outpatient on 02/03/2021.  He was discharged home with Augmentin and doxycycline until after surgery.    Assessment & Plan:   Principal Problem:   Wound infection Active Problems:   Hypertensive chronic kidney disease with stage 1 through stage 4 chronic kidney disease, or unspecified chronic kidney disease   Major depressive disorder, recurrent, unspecified (HCC)   Moderate protein-calorie malnutrition (HCC)   Secondary hyperparathyroidism of renal origin ([place])   Reactive depression   Gangrene of finger of right hand ([place])   Wound dehiscence, surgical, initial encounter   Osteomyelitis of finger of right hand ([place])   ESRD on hemodialysis ([place])   Hyperphosphatemia   Metabolic acidosis   Hyperkalemia   Hypermagnesemia   Mixed hyperlipidemia   Essential hypertension, benign   Coronary artery disease   Blindness of left eye   AF (paroxysmal atrial fibrillation) (HCC)   Gastroparesis   Iron deficiency anemia, unspecified   Osteomyelitis of finger of right hand (HCC) Sed rate 97 CRP 3.6 Negative MRSA  PCR Vancomycin/cefepime Plain films 12/14 with new destructive changes involving base of ring finger proximal phalanx compatible with acute osteo S/p right upper extremity angiogram with angioplasty of distal right ulnar artery and proximal right radial artery Appreciate vascular and orthopedic recommendations (pending ortho eval)  ESRD on hemodialysis ([place]) Missed Hx Tuesday Last HD 12/8 Hyperkalemia today, plan for dialysis today  Hyperphosphatemia Per renal with esrd, need for dialysis  Hypermagnesemia Electrolyte abnormalities per renal  Hyperkalemia Dialysis today  Metabolic acidosis Related to ESRD Per renal  Mixed hyperlipidemia statin  Coronary artery disease Statin, coreg  Essential hypertension, benign coreg  AF (paroxysmal atrial fibrillation) ([place]) Continue amiodarone, coreg Discharged on eliquis on 11/18, but looks like anticoagulation has fallen off (not on 12/9 d/c summary)? Unclear why to me?  Plavix is on med rec (listed as being held for surgery)  Will discuss with pharmacy.  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 Ultrasound guidance vascular access to right femoral artery. 2. Catheter placement to right radial artery and right ulnar arteries     from right femoral approach. 3. Thoracic aortogram and selective right upper extremity angiogram  including selective images of the radial and ulnar arteries. 4.  Percutaneous transluminal of distal right  ulnar artery with 2.5 mm diameter angioplasty balloon 5.  Percutaneous transluminal angioplasty of the proximal right radial artery with 3 mm diameter angioplasty balloon 5. StarClose closure device right femoral artery.  Antimicrobials:  Anti-infectives (From admission, onward)    Start     Dose/Rate Route Frequency Ordered Stop   02/05/21 1800  ceFEPIme (MAXIPIME) 1 g in sodium chloride 0.9 % 100 mL IVPB        1 g 200 mL/hr over 30 Minutes Intravenous Every 24 hours 02/05/21 1126     02/05/21 1400  vancomycin (VANCOCIN) 1,000 mg in sodium chloride 0.9 % 150 mL IVPB  Status:  Discontinued        1,000 mg 150 mL/hr over 60 Minutes Intravenous Every Dialysis 02/05/21 1126 02/05/21 1306   02/05/21 1400  vancomycin (VANCOCIN) IVPB 1000 mg/200 mL premix        1,000 mg 200 mL/hr over 60 Minutes Intravenous Every T-Th-Sa (Hemodialysis) 02/05/21 1306     02/05/21 0815  ceFAZolin (ANCEF) IVPB 2g/100 mL premix        2 g 200 mL/hr over 30 Minutes Intravenous On call to O.R. 02/05/21 0759 02/05/21 0931   02/05/21 0012  vancomycin variable dose per unstable renal function (pharmacist dosing)  Status:  Discontinued         Does not apply See admin instructions 02/05/21 0013 02/05/21 1126       Subjective: Feels ok postop Wife and pastor at bedside  Objective: Vitals:   02/05/21 1030 02/05/21 1045 02/05/21 1057 02/05/21 1128  BP: (!) 141/72 140/74 (!) 142/74 (!) 163/77  Pulse: 71 67 69 67  Resp: 18 19 13 18   Temp:    97.9 F (36.6 C)  TempSrc:      SpO2: 95% 93% 96% 99%    Intake/Output Summary (Last 24 hours) at 02/05/2021 1434 Last data filed at 02/05/2021 1408 Gross per 24 hour  Intake 440 ml  Output --  Net 440 ml   There were no vitals filed for this visit.  Examination:  General exam: Appears calm and comfortable  Respiratory system: unlabored Cardiovascular system: RRR Gastrointestinal system: Abdomen is nondistended, soft and nontender Central nervous system:  Alert and oriented. No focal neurological deficits. Extremities: RUE fistula with thrill, r hand in dressing Skin: No rashes, lesions or ulcers Psychiatry: Judgement and insight appear normal. Mood & affect appropriate.     Data Reviewed: I have personally reviewed following labs and imaging studies  CBC: Recent Labs  Lab 01/30/21 0412 02/04/21 1628 02/05/21 0143  WBC 10.9* 10.8* 12.6*  NEUTROABS  --  7.5  --   HGB 7.2* 7.4* 7.2*  HCT 23.8* 25.5* 23.5*  MCV 76.8* 81.0 78.3*  PLT 242 270 235    Basic Metabolic Panel: Recent Labs  Lab 01/30/21 0412 02/04/21 1628 02/04/21 2340 02/05/21 0143  NA 133* 149*  --  146*  K 3.2* 5.3*  --  5.8*  CL 100 121*  --  120*  CO2 22 10*  --  10*  GLUCOSE 120* 70  --  85  BUN 49* 79*  --  82*  CREATININE 9.15* 17.99*  --  17.53*  CALCIUM 7.7* 8.5*  --  8.3*  MG  --   --  2.5*  --   PHOS  --   --  11.5*  --     GFR: Estimated Creatinine Clearance:  5.9 mL/min (Aishah Teffeteller) (by C-G formula based on SCr of 17.53 mg/dL (H)).  Liver Function Tests: Recent Labs  Lab 02/04/21 1628  AST 11*  ALT 14  ALKPHOS 64  BILITOT 0.2*  PROT 7.0  ALBUMIN 3.0*    CBG: Recent Labs  Lab 01/29/21 2157 01/30/21 0801 01/30/21 1209  GLUCAP 171* 80 81     Recent Results (from the past 240 hour(s))  Body fluid culture w Gram Stain     Status: None   Collection Time: 01/29/21  1:06 AM   Specimen: Hand; Body Fluid  Result Value Ref Range Status   Specimen Description   Final    HAND LEFT Performed at [facility], [address] [place], [place] [address]    Special Requests   Final    NONE Performed at [facility], [place], [place] [address]    Gram Stain   Final    ABUNDANT WBC PRESENT, PREDOMINANTLY PMN MODERATE GRAM POSITIVE COCCI MODERATE GRAM NEGATIVE RODS RARE GRAM POSITIVE RODS Performed at [facility], [place] [address] [place], [place] [address]    Culture   Final    MODERATE CITROBACTER  YOUNGAE FEW PROTEUS MIRABILIS MIXED ANAEROBIC FLORA PRESENT.  CALL LAB IF FURTHER IID REQUIRED.    Report Status 01/31/2021 FINAL  Final   Organism ID, Bacteria CITROBACTER YOUNGAE  Final   Organism ID, Bacteria PROTEUS MIRABILIS  Final      Susceptibility   Citrobacter youngae - MIC*    CEFAZOLIN >=64 RESISTANT Resistant     CEFEPIME <=0.12 SENSITIVE Sensitive     CEFTAZIDIME <=1 SENSITIVE Sensitive     CEFTRIAXONE <=0.25 SENSITIVE Sensitive     CIPROFLOXACIN <=0.25 SENSITIVE Sensitive     GENTAMICIN <=1 SENSITIVE Sensitive     IMIPENEM <=0.25 SENSITIVE Sensitive     TRIMETH/SULFA <=20 SENSITIVE Sensitive     PIP/TAZO <=4 SENSITIVE Sensitive     * MODERATE CITROBACTER YOUNGAE   Proteus mirabilis - MIC*    AMPICILLIN <=2 SENSITIVE Sensitive     CEFAZOLIN <=4 SENSITIVE Sensitive     CEFEPIME <=0.12 SENSITIVE Sensitive     CEFTAZIDIME <=1 SENSITIVE Sensitive     CEFTRIAXONE <=0.25 SENSITIVE Sensitive     CIPROFLOXACIN <=0.25 SENSITIVE Sensitive     GENTAMICIN <=1 SENSITIVE Sensitive     IMIPENEM 2 SENSITIVE Sensitive     TRIMETH/SULFA <=20 SENSITIVE Sensitive     AMPICILLIN/SULBACTAM <=2 SENSITIVE Sensitive     PIP/TAZO <=4 SENSITIVE Sensitive     * FEW PROTEUS MIRABILIS  Resp Panel by RT-PCR (Flu Glorianna Gott&B, Covid) Hand, Left     Status: None   Collection Time: 01/29/21  1:06 AM   Specimen: Hand, Left; Nasopharyngeal(NP) swabs in vial transport medium  Result Value Ref Range Status   SARS Coronavirus 2 by RT PCR NEGATIVE NEGATIVE Final    Comment: (NOTE) SARS-CoV-2 target nucleic acids are NOT DETECTED.  The SARS-CoV-2 RNA is generally detectable in upper respiratory specimens during the acute phase of infection. The lowest concentration of SARS-CoV-2 viral copies this assay can detect is 138 copies/mL. Tacey Dimaggio negative result does not preclude SARS-Cov-2 infection and should not be used as the sole basis for treatment or other patient management decisions. Abbiegail Landgren negative result may occur  with  improper specimen collection/handling, submission of specimen other than nasopharyngeal swab, presence of viral mutation(s) within the areas targeted by this assay, and inadequate number of viral copies(<138 copies/mL). Khiley Lieser negative result must be  combined with clinical observations, patient history, and epidemiological information. The expected result is Negative.  Fact Sheet for Patients:  EntrepreneurPulse.com.au  Fact Sheet for Healthcare Providers:  IncredibleEmployment.be  This test is no t yet approved or cleared by the [place] FDA and  has been authorized for detection and/or diagnosis of SARS-CoV-2 by FDA under an Emergency Use Authorization (EUA). This EUA will remain  in effect (meaning this test can be used) for the duration of the COVID-19 declaration under Section 564(b)(1) of the Act, 21 U.S.C.section 360bbb-3(b)(1), unless the authorization is terminated  or revoked sooner.       Influenza Wilmon Conover by PCR NEGATIVE NEGATIVE Final   Influenza B by PCR NEGATIVE NEGATIVE Final    Comment: (NOTE) The Xpert Xpress SARS-CoV-2/FLU/RSV plus assay is intended as an aid in the diagnosis of influenza from Nasopharyngeal swab specimens and should not be used as Granvel Proudfoot sole basis for treatment. Nasal washings and aspirates are unacceptable for Xpert Xpress SARS-CoV-2/FLU/RSV testing.  Fact Sheet for Patients: EntrepreneurPulse.com.au  Fact Sheet for Healthcare Providers: IncredibleEmployment.be  This test is not yet approved or cleared by the [place] FDA and has been authorized for detection and/or diagnosis of SARS-CoV-2 by FDA under an Emergency Use Authorization (EUA). This EUA will remain in effect (meaning this test can be used) for the duration of the COVID-19 declaration under Section 564(b)(1) of the Act, 21 U.S.C. section 360bbb-3(b)(1), unless the authorization is terminated  or revoked.  Performed at [facility], [address] [place], [place] [address]   Culture, blood (routine x 2)     Status: None (Preliminary result)   Collection Time: 02/04/21  4:28 PM   Specimen: BLOOD  Result Value Ref Range Status   Specimen Description BLOOD BLOOD LEFT HAND  Final   Special Requests   Final    BOTTLES DRAWN AEROBIC AND ANAEROBIC Blood Culture results may not be optimal due to an excessive volume of blood received in culture bottles Performed at [facility], [address] [place], [place] [address]    Culture PENDING  Incomplete   Report Status PENDING  Incomplete  Culture, blood (routine x 2)     Status: None (Preliminary result)   Collection Time: 02/04/21  4:30 PM   Specimen: BLOOD  Result Value Ref Range Status   Specimen Description BLOOD BLOOD LEFT FOREARM  Final   Special Requests   Final    BOTTLES DRAWN AEROBIC AND ANAEROBIC Blood Culture adequate volume Performed at [facility], [address] [place],  [address]    Culture PENDING  Incomplete   Report Status PENDING  Incomplete  Resp Panel by RT-PCR (Flu Shaunee Mulkern&B, Covid) Nasopharyngeal Swab     Status: None   Collection Time: 02/04/21  4:30 PM   Specimen: Nasopharyngeal Swab; Nasopharyngeal(NP) swabs in vial transport medium  Result Value Ref Range Status   SARS Coronavirus 2 by RT PCR NEGATIVE NEGATIVE Final    Comment: (NOTE) SARS-CoV-2 target nucleic acids are NOT DETECTED.  The SARS-CoV-2 RNA is generally detectable in upper respiratory specimens during the acute phase of infection. The lowest concentration of SARS-CoV-2 viral copies this assay can detect is 138 copies/mL. Pailyn Bellevue negative result does not preclude SARS-Cov-2 infection and should not be used as the sole basis for treatment or other patient management decisions. Arleta Ostrum negative result may occur with  improper specimen collection/handling, submission of specimen other than nasopharyngeal swab, presence of viral  mutation(s) within the areas targeted by this assay,  and inadequate number of viral copies(<138 copies/mL). Reneisha Stilley negative result must be combined with clinical observations, patient history, and epidemiological information. The expected result is Negative.  Fact Sheet for Patients:  EntrepreneurPulse.com.au  Fact Sheet for Healthcare Providers:  IncredibleEmployment.be  This test is no t yet approved or cleared by the [place] FDA and  has been authorized for detection and/or diagnosis of SARS-CoV-2 by FDA under an Emergency Use Authorization (EUA). This EUA will remain  in effect (meaning this test can be used) for the duration of the COVID-19 declaration under Section 564(b)(1) of the Act, 21 U.S.C.section 360bbb-3(b)(1), unless the authorization is terminated  or revoked sooner.       Influenza Mauri Temkin by PCR NEGATIVE NEGATIVE Final   Influenza B by PCR NEGATIVE NEGATIVE Final    Comment: (NOTE) The Xpert Xpress SARS-CoV-2/FLU/RSV plus assay is intended as an aid in the diagnosis of influenza from Nasopharyngeal swab specimens and should not be used as Sawyer Kahan sole basis for treatment. Nasal washings and aspirates are unacceptable for Xpert Xpress SARS-CoV-2/FLU/RSV testing.  Fact Sheet for Patients: EntrepreneurPulse.com.au  Fact Sheet for Healthcare Providers: IncredibleEmployment.be  This test is not yet approved or cleared by the [place] FDA and has been authorized for detection and/or diagnosis of SARS-CoV-2 by FDA under an Emergency Use Authorization (EUA). This EUA will remain in effect (meaning this test can be used) for the duration of the COVID-19 declaration under Section 564(b)(1) of the Act, 21 U.S.C. section 360bbb-3(b)(1), unless the authorization is terminated or revoked.  Performed at [facility], [address] [place], [place] [address]   CULTURE, BLOOD (ROUTINE X 2) w Reflex to ID  Panel     Status: None (Preliminary result)   Collection Time: 02/04/21 11:40 PM   Specimen: BLOOD  Result Value Ref Range Status   Specimen Description BLOOD LEFT ASSIST CONTROL  Final   Special Requests   Final    BOTTLES DRAWN AEROBIC AND ANAEROBIC Blood Culture adequate volume   Culture   Final    NO GROWTH < 12 HOURS Performed at [facility], [address], [place], [place] [address]    Report Status PENDING  Incomplete  CULTURE, BLOOD (ROUTINE X 2) w Reflex to ID Panel     Status: None (Preliminary result)   Collection Time: 02/04/21 11:40 PM   Specimen: BLOOD  Result Value Ref Range Status   Specimen Description BLOOD LEFT HAND  Final   Special Requests   Final    BOTTLES DRAWN AEROBIC AND ANAEROBIC Blood Culture adequate volume   Culture   Final    NO GROWTH < 12 HOURS Performed at [facility], [address] [place], [place] [address]    Report Status PENDING  Incomplete  MRSA Next Gen by PCR, Nasal     Status: None   Collection Time: 02/05/21  1:24 AM   Specimen: Nasal Mucosa; Nasal Swab  Result Value Ref Range Status   MRSA by PCR Next Gen NOT DETECTED NOT DETECTED Final    Comment: (NOTE) The GeneXpert MRSA Assay (FDA approved for NASAL specimens only), is one component of Zosia Lucchese comprehensive MRSA colonization surveillance program. It is not intended to diagnose MRSA infection nor to guide or monitor treatment for MRSA infections. Test performance is not FDA approved in patients less than 23 years old. Performed at [facility], [address], [place], [place] [address]          Radiology Studies: PERIPHERAL VASCULAR  CATHETERIZATION  Result Date: 02/05/2021 See surgical note for result.  DG Hand Complete Right  Result Date: 02/04/2021 CLINICAL DATA:  Right hand wound, fever.  Surgery in October of 2022 EXAM: RIGHT HAND - COMPLETE 3+ VIEW COMPARISON:  01/28/2021 FINDINGS: Postsurgical changes from ring finger amputation at  the level of the proximal phalanx. New destructive changes involving the base of the ring finger proximal phalanx compatible with acute osteomyelitis. No fracture or dislocation. Osseous structures are demineralized. Severe small vessel atherosclerotic calcifications. Prominent soft tissue swelling of the hand. IMPRESSION: New destructive changes involving the base of the ring finger proximal phalanx compatible with acute osteomyelitis. Electronically Signed   By: Davina Poke D.O.   On: 02/04/2021 16:49        Scheduled Meds:  amiodarone  200 mg Oral Daily   atorvastatin  80 mg Oral QHS   calcium acetate  2,001 mg Oral TID WC   carvedilol  25 mg Oral BID WC   fentaNYL       fentaNYL       heparin sodium (porcine)       midazolam       Continuous Infusions:  ceFEPime (MAXIPIME) IV     vancomycin       LOS: 1 day    Time spent: over 30 min    Fayrene Helper, MD [facility]   To contact the attending provider between 7A-7P or the covering provider during after hours 7P-7A, please log into the web site [email or web address] and access using universal [place] password for that web site. If you do not have the password, please call the hospital operator.  02/05/2021, 2:34 PM

## 2021-02-05 NOTE — Hospital Course (Addendum)
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  He's s/p previous partial amputation of R 4th finger.  On 12/1 per care everywhere orthopedic note he had right ring finger dehisced wound with wet gangrene from amputation site.  Orthopedics desired vascular c/s due to concern for ability to heal wound.  He was admitted from 12/8 to 12/9 and seen by vascular.  He was discharged on augmentin and doxycycline with Vineta Carone plan for angiogram and attempt revascularization outpatient.  He was readmitted on 12/14 due to worsening pain and swelling with Keeven Matty plan for vascular evaluation.  Imaging on 12/14 showed new destructive changes c/w acute osteomyelitis.  Vascular saw on 12/15 and performed angioplasty of distal ulnar artery and proximal right radial artery.  He was noted to have steal syndrome of the right arm and per my discussion with vascular they discussed options with patient including amputation of hand vs ligation of fistula and attempting to be more conservative with amputation.  Patient is now s/p coil embolization of the right brachiocephalic AV fistula as well as large cephalic vein branch.  Plan initially was for outpatient hand surgery, but he's had recurrent temperatures and now elevated white blood cell count.  General orthopedics was consulted today and recommended transfer to care facility with hand specialist on staff.  I discussed the case with Dr. Tempie Donning who will see the patient when [facility] arrives at [facility].

## 2021-02-05 NOTE — Assessment & Plan Note (Addendum)
Missed Hx Tuesday Per renal - consider L AV fistula vs PD

## 2021-02-05 NOTE — Assessment & Plan Note (Signed)
statin

## 2021-02-05 NOTE — Assessment & Plan Note (Signed)
noted 

## 2021-02-05 NOTE — Progress Notes (Signed)
[facility]  ROUNDING NOTE   Subjective:   [name]. is a 50 year old male with a past medical history significant for diabetes, hypertension, diastolic CHF, CAD with STEMI January 2022, and end stage renal disease on dialysis. Patient presents to emergency room from [facility] for orthopedic evaluation. Patient has been admitted for Osteomyelitis [facility]) [M86.9] Wound infection [T14.8XXA, L08.9]   Patient is known to our clinic and receives outpatient treatment from [facility] on a TTS schedule. He is supervised by Dr Juleen China. He was recently admitted a wound infection and placed on antibiotics.  He presented to [facility] with complains of fever and chills that began the night prior to presentation. Transferred to [facility] for evaluation by vascular surgery to determine interventions.  Patient seen resting quietly in bed after vascular procedure. Denies pain and discomfort at this time. Denies shortness of breath. States he has been feeling bad since discharge. Currently receives antibiotics with dialysis, but has missed some dialysis treatments.   We have been consulted to manage dialysis needs during this admission.    Objective:  Vital signs in last 24 hours:  Temp:  [97.9 F (36.6 C)-98.9 F (37.2 C)] 97.9 F (36.6 C) (12/15 1128) Pulse Rate:  [65-84] 67 (12/15 1128) Resp:  [13-20] 18 (12/15 1128) BP: (119-194)/(71-87) 163/77 (12/15 1128) SpO2:  [93 %-100 %] 99 % (12/15 1128) Weight:  [89.4 kg] 89.4 kg (12/14 1505)  Weight change:  There were no vitals filed for this visit.   Intake/Output: No intake/output data recorded.   Intake/Output this shift:  Total I/O In: 200 [P.O.:200] Out: -   Physical Exam: General: NAD, resting comfortably  Head: Normocephalic, atraumatic. Moist oral mucosal membranes  Eyes: Anicteric  Lungs:  Clear to auscultation, normal effort  Heart: Regular rate and rhythm  Abdomen:  Soft, nontender  Extremities:  1+  peripheral edema.  Neurologic: Nonfocal, moving all four extremities  Skin:  Right 4th finger wound dressing  Access: LUE AVF    Basic Metabolic Panel: Recent Labs  Lab 01/30/21 0412 02/04/21 1628 02/04/21 2340 02/05/21 0143  NA 133* 149*  --  146*  K 3.2* 5.3*  --  5.8*  CL 100 121*  --  120*  CO2 22 10*  --  10*  GLUCOSE 120* 70  --  85  BUN 49* 79*  --  82*  CREATININE 9.15* 17.99*  --  17.53*  CALCIUM 7.7* 8.5*  --  8.3*  MG  --   --  2.5*  --   PHOS  --   --  11.5*  --      Liver Function Tests: Recent Labs  Lab 02/04/21 1628  AST 11*  ALT 14  ALKPHOS 64  BILITOT 0.2*  PROT 7.0  ALBUMIN 3.0*    No results for input(s): LIPASE, AMYLASE in the last 168 hours. No results for input(s): AMMONIA in the last 168 hours.  CBC: Recent Labs  Lab 01/30/21 0412 02/04/21 1628 02/05/21 0143  WBC 10.9* 10.8* 12.6*  NEUTROABS  --  7.5  --   HGB 7.2* 7.4* 7.2*  HCT 23.8* 25.5* 23.5*  MCV 76.8* 81.0 78.3*  PLT 242 270 257     Cardiac Enzymes: No results for input(s): CKTOTAL, CKMB, CKMBINDEX, TROPONINI in the last 168 hours.  BNP: Invalid input(s): POCBNP  CBG: Recent Labs  Lab 01/29/21 2157 01/30/21 0801 01/30/21 1209  GLUCAP 171* 80 81     Microbiology: Results for orders placed or performed  during the hospital encounter of 02/04/21  CULTURE, BLOOD (ROUTINE X 2) w Reflex to ID Panel     Status: None (Preliminary result)   Collection Time: 02/04/21 11:40 PM   Specimen: BLOOD  Result Value Ref Range Status   Specimen Description BLOOD LEFT ASSIST CONTROL  Final   Special Requests   Final    BOTTLES DRAWN AEROBIC AND ANAEROBIC Blood Culture adequate volume   Culture   Final    NO GROWTH < 12 HOURS Performed at [facility], [address] [place], [place] [address]    Report Status PENDING  Incomplete  CULTURE, BLOOD (ROUTINE X 2) w Reflex to ID Panel     Status: None (Preliminary result)   Collection Time: 02/04/21 11:40 PM    Specimen: BLOOD  Result Value Ref Range Status   Specimen Description BLOOD LEFT HAND  Final   Special Requests   Final    BOTTLES DRAWN AEROBIC AND ANAEROBIC Blood Culture adequate volume   Culture   Final    NO GROWTH < 12 HOURS Performed at [facility], [address] [place], [place] [address]    Report Status PENDING  Incomplete  MRSA Next Gen by PCR, Nasal     Status: None   Collection Time: 02/05/21  1:24 AM   Specimen: Nasal Mucosa; Nasal Swab  Result Value Ref Range Status   MRSA by PCR Next Gen NOT DETECTED NOT DETECTED Final    Comment: (NOTE) The GeneXpert MRSA Assay (FDA approved for NASAL specimens only), is one component of a comprehensive MRSA colonization surveillance program. It is not intended to diagnose MRSA infection nor to guide or monitor treatment for MRSA infections. Test performance is not FDA approved in patients less than 49 years old. Performed at [facility], [address] [place],  [address]     Coagulation Studies: No results for input(s): LABPROT, INR in the last 72 hours.  Urinalysis: No results for input(s): COLORURINE, LABSPEC, PHURINE, GLUCOSEU, HGBUR, BILIRUBINUR, KETONESUR, PROTEINUR, UROBILINOGEN, NITRITE, LEUKOCYTESUR in the last 72 hours.  Invalid input(s): APPERANCEUR    Imaging: PERIPHERAL VASCULAR CATHETERIZATION  Result Date: 02/05/2021 See surgical note for result.  DG Hand Complete Right  Result Date: 02/04/2021 CLINICAL DATA:  Right hand wound, fever.  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 New destructive changes involving the base of the ring finger proximal phalanx compatible with acute osteomyelitis. No fracture or dislocation. Osseous structures are demineralized. Severe small vessel atherosclerotic calcifications. Prominent soft tissue swelling  of the hand. IMPRESSION: New destructive changes involving the base of the ring finger proximal phalanx compatible with acute osteomyelitis. Electronically Signed   By: Davina Poke D.O.   On: 02/04/2021 16:49     Medications:    ceFEPime (MAXIPIME) IV     vancomycin (VANCOCIN) 750 mg IVPB (Hemodialysis)      amiodarone  200 mg Oral Daily   atorvastatin  80 mg Oral QHS   calcium acetate  2,001 mg Oral TID WC   carvedilol  25 mg Oral BID WC   fentaNYL       fentaNYL       heparin sodium (porcine)       midazolam       acetaminophen **OR** acetaminophen, HYDROcodone-acetaminophen, HYDROmorphone (DILAUDID) injection, labetalol, morphine injection, nitroGLYCERIN, ondansetron (ZOFRAN) IV, vancomycin (VANCOCIN) 750 mg IVPB (  Hemodialysis)  Assessment/ Plan:  [name]. is a 50 y.o.  male with a past medical history significant for diabetes, hypertension, diastolic CHF, CAD with STEMI January 2022, and end stage renal disease on dialysis. Patiet presents to emergency room for further evaluation of right, fourth finger treatment. Patient has been admitted for Osteomyelitis [facility]) [M86.9] Wound infection [T14.8XXA, L08.9]  [facility] TTS [facility] Left AVF 93kg  End stage renal disease on dialysis. Scheduled to receive dialysis today to maintain outpatient schedule. UF goal 1.5L as tolerated. Discussed with patient and wife peritoneal dialysis and how it may benefit patient. Wife has agreed to speak with educator and consider. Next treatment scheduled for Saturday.  2. Anemia of chronic kidney disease.  Lab Results  Component Value Date   HGB 7.2 (L) 02/05/2021  Will order EPO 10000 units IV with treatments  3. Secondary Hyperparathyroidism:  Lab Results  Component Value Date   CALCIUM 8.3 (L) 02/05/2021   CAION 0.94 (L) 01/08/2021   PHOS 11.5 (H) 02/04/2021   Phosphorus very elevated. Discussed this with patient and spouse about diet, medication and treatment  compliance.  Continue calcium acetate with meals  4. Hypertension with chronic kidney disease. Home regimen includes losartan, carvedilol and amiodarone. Losartan currently held.  5. Osteomyelitis of Rt fourth digit. Currently on antibiotic therapy with Cefepime and Vancomycin. Underwent right upper extremity angiogram today showing steal with majority blood flow going to fistula and little going to hand. Angioplasty balloon used to remove occlusion and allow small amount of blood flow to hand. Further intervention would include ligation of AVF.    LOS: 1   12/15/20221:02 PM

## 2021-02-05 NOTE — Progress Notes (Signed)
Patient off unit, at dialysis, did not return to unit until after 19:15.

## 2021-02-05 NOTE — Assessment & Plan Note (Addendum)
Anemia related to esrd, will repeat anemia labs (AOCD, folate def, low normal b12)

## 2021-02-05 NOTE — Progress Notes (Signed)
Subjective:    Patient ID: [name]., male    DOB: 1970/06/12, 50 y.o.   MRN: [id number] Chief Complaint  Patient presents with   Follow-up    Ultrasound follow up    [name] Joshuan, [name] is a 50 year old male that presents today due to concern for steal syndrome in his right upper extremity.  The patient previously had banding done on 11/27/2020 following gangrenous changes to his fourth finger.  Since that time the finger has been amputated but the patient continues to have significant foul-smelling drainage and pain.  He also notes that his hand swelling excessively.  He notes that he is supposed to have upcoming hand surgery however the surgery was apprehensive to proceed without vascular involvement due to concern for being able to heal any further open wounds or ulcerations.  Today the patient did have drainage from the wound and is foul-smelling.  He denies any further open wounds or ulcerations.  Today noninvasive studies show flow volume of 2618.  No areas of significant stenosis noted.   Review of Systems  Musculoskeletal:  Positive for myalgias.  Skin:  Positive for color change and wound.  Neurological:  Positive for numbness.  All other systems reviewed and are negative.     Objective:   Physical Exam Vitals reviewed.  HENT:     Head: Normocephalic.  Cardiovascular:     Rate and Rhythm: Normal rate.     Pulses:          Radial pulses are 0 on the right side.  Neurological:     Mental Status: He is alert.    BP (!) 143/73 (BP Location: Left Arm)    Pulse 66    Resp 16    Wt 201 lb 9.6 oz (91.4 kg)    BMI 25.20 kg/m   Past Medical History:  Diagnosis Date   Anxiety    Cellulitis    CHF (congestive heart failure) (HCC)    Chronic diarrhea    Chronic kidney disease    Chronic pain of both ankles    Diabetes mellitus without complication (HCC)    Diabetes mellitus, type II (HCC)    Diabetic neuropathy (HCC)    Frequent falls    Gait instability     GERD (gastroesophageal reflux disease)    Heart palpitations    Hemodialysis patient ([place])    History of kidney stones    HLD (hyperlipidemia)    Hypertension    Insomnia    Nausea and vomiting in adult    recurrent    Social History   Socioeconomic History   Marital status: Married    Spouse name: [name]    Number of children: 2   Years of education: Not on file   Highest education level: High school graduate  Occupational History   Occupation: Disability    Comment: not employed  Tobacco Use   Smoking status: Never   Smokeless tobacco: Never  Vaping Use   Vaping Use: Never used  Substance and Sexual Activity   Alcohol use: No   Drug use: No   Sexual activity: Yes  Other Topics Concern   Not on file  Social History Narrative   Oldest son killed in car crash June 2020.    Social Determinants of Health   Financial Resource Strain: Not on file  Food Insecurity: Not on file  Transportation Needs: No Transportation Needs   Lack of Transportation (Medical): No   Lack of Transportation (Non-Medical): No  Physical Activity: Not on file  Stress: Not on file  Social Connections: Not on file  Intimate Partner Violence: Not on file    Past Surgical History:  Procedure Laterality Date   AMPUTATION Left 03/16/2016   Procedure: [place];  Surgeon: Trula Slade, DPM;  Location: [place];  Service: Podiatry;  Laterality: Left;  can start around 5    ARTHROSCOPIC REPAIR ACL Left    AV FISTULA PLACEMENT Right 05/31/2019   Procedure: Brachiocephalic AV fistula creation;  Surgeon: Algernon Huxley, MD;  Location: [name] ORS;  Service: Vascular;  Laterality: Right;   COLONOSCOPY WITH PROPOFOL N/A 10/28/2015   Procedure: COLONOSCOPY WITH PROPOFOL;  Surgeon: Lollie Sails, MD;  Location: [facility] ENDOSCOPY;  Service: Endoscopy;  Laterality: N/A;   COLONOSCOPY WITH PROPOFOL N/A 10/29/2015   Procedure: COLONOSCOPY WITH PROPOFOL;  Surgeon: Lollie Sails, MD;  Location:  [facility] ENDOSCOPY;  Service: Endoscopy;  Laterality: N/A;   CORONARY/GRAFT ACUTE MI REVASCULARIZATION N/A 02/26/2020   Procedure: Coronary/Graft Acute MI Revascularization;  Surgeon: Jettie Booze, MD;  Location: [place] CV LAB;  Service: Cardiovascular;  Laterality: N/A;   DIALYSIS/PERMA CATHETER INSERTION Right 04/26/2019   Perm Cath    DIALYSIS/PERMA CATHETER INSERTION N/A 04/26/2019   Procedure: DIALYSIS/PERMA CATHETER INSERTION;  Surgeon: Algernon Huxley, MD;  Location: [place] CV LAB;  Service: Cardiovascular;  Laterality: N/A;   DIALYSIS/PERMA CATHETER REMOVAL N/A 08/15/2019   Procedure: DIALYSIS/PERMA CATHETER REMOVAL;  Surgeon: Katha Cabal, MD;  Location: [place] CV LAB;  Service: Cardiovascular;  Laterality: N/A;   ESOPHAGOGASTRODUODENOSCOPY (EGD) WITH PROPOFOL N/A 12/27/2017   Procedure: ESOPHAGOGASTRODUODENOSCOPY (EGD) WITH PROPOFOL;  Surgeon: [place], Benay Pike, MD;  Location: [name] ENDOSCOPY;  Service: Gastroenterology;  Laterality: N/A;   INTRAVASCULAR ULTRASOUND/IVUS N/A 02/26/2020   Procedure: Intravascular Ultrasound/IVUS;  Surgeon: Jettie Booze, MD;  Location: [place] CV LAB;  Service: Cardiovascular;  Laterality: N/A;   LEFT HEART CATH AND CORONARY ANGIOGRAPHY N/A 02/26/2020   Procedure: LEFT HEART CATH AND CORONARY ANGIOGRAPHY;  Surgeon: Jettie Booze, MD;  Location: [place] CV LAB;  Service: Cardiovascular;  Laterality: N/A;   PROSTATE SURGERY  2016   TONSILECTOMY/ADENOIDECTOMY WITH MYRINGOTOMY     TONSILLECTOMY     UPPER EXTREMITY ANGIOGRAPHY Right 11/26/2020   Procedure: Upper Extremity Angiography;  Surgeon: Algernon Huxley, MD;  Location: [place] CV LAB;  Service: Cardiovascular;  Laterality: Right;    Family History  Problem Relation Age of Onset   CAD Father    Stroke Father    Diabetes Mellitus II Mother    Kidney failure Mother    Schizophrenia Mother     Allergies  Allergen Reactions   Levofloxacin Swelling    Of  face Other reaction(s): swelling   Promethazine Diarrhea and Other (See Comments)    Other reaction(s): Other (See Comments), Other (See Comments) Other reaction(s): Muscle cramps Muscle cramps Cramping Other reaction(s): Muscle cramps Muscle cramps   Other     Other reaction(s): Unknown   Malt Other (See Comments)    Other reaction(s): cramping    CBC Latest Ref Rng & Units 02/04/2021 01/30/2021 01/29/2021  WBC 4.0 - 10.5 K/uL 10.8(H) 10.9(H) 10.3  Hemoglobin 13.0 - 17.0 g/dL 7.4(L) 7.2(L) 7.7(L)  Hematocrit 39.0 - 52.0 % 25.5(L) 23.8(L) 25.3(L)  Platelets 150 - 400 K/uL 270 242 240      CMP     Component Value Date/Time   NA 149 (H) 02/04/2021 1628   K  5.3 (H) 02/04/2021 1628   CL 121 (H) 02/04/2021 1628   CO2 10 (L) 02/04/2021 1628   GLUCOSE 70 02/04/2021 1628   BUN 79 (H) 02/04/2021 1628   CREATININE 17.99 (H) 02/04/2021 1628   CALCIUM 8.5 (L) 02/04/2021 1628   PROT 7.0 02/04/2021 1628   ALBUMIN 3.0 (L) 02/04/2021 1628   AST 11 (L) 02/04/2021 1628   ALT 14 02/04/2021 1628   ALKPHOS 64 02/04/2021 1628   BILITOT 0.2 (L) 02/04/2021 1628   GFRNONAA 3 (L) 02/04/2021 1628   GFRAA 5 (L) 08/27/2019 1540     No results found.     Assessment & Plan:   1. ESRD on dialysis [facility]) Today following the banding the patient previously had he still has a flow volume of 2618 present in his right brachiocephalic AV fistula.  Previous angiogram prior to his banding showed an occluded ulnar artery with sluggish flow from the radial artery.  The patient's finger amputation has not been healing well and is causing him significant pain with evidence of infection.  The orthopedic hand surgeon is waiting for intervention to see if he would possibly heal a finger amputation, or more invasive surgery.  In order to determine if some sort of endovascular intervention is possible the patient will require an upper extremity angiogram.  It is possible that the patient may also require ligation of  his AV fistula in order to stop steal syndrome history to be present.  The risk, benefits and alternatives discussed with patient he agrees to proceed with intervention. 2. Uncontrolled type 2 diabetes mellitus with hyperglycemia, with long-term current use of insulin (HCC) Continue hypoglycemic medications as already ordered, these medications have been reviewed and there are no changes at this time.  Hgb A1C to be monitored as already arranged by primary service   3. Essential hypertension, benign Continue antihypertensive medications as already ordered, these medications have been reviewed and there are no changes at this time.   4. Mixed hyperlipidemia Continue statin as ordered and reviewed, no changes at this time    No current facility-administered medications on file prior to visit.   Current Outpatient Medications on File Prior to Visit  Medication Sig Dispense Refill   acetaminophen (TYLENOL) 325 MG tablet Take 2 tablets (650 mg total) by mouth every 6 (six) hours as needed for mild pain (or Fever >/= 101).     amiodarone (PACERONE) 200 MG tablet Take 1 tablet (200 mg total) by mouth daily. 1 twice daily for 2 weeks, then 1 daily 30 tablet 1   amoxicillin-clavulanate (AUGMENTIN) 500-125 MG tablet Take 1 tablet (500 mg total) by mouth daily for 7 days. Must be taken AFTER dialysis on dialysis days. (Patient not taking: Reported on 02/04/2021) 7 tablet 0   atorvastatin (LIPITOR) 80 MG tablet TAKE 1 TABLET (80 MG TOTAL) BY MOUTH AT BEDTIME. 90 tablet 0   azelastine (ASTELIN) 0.1 % nasal spray Place 2 sprays into both nostrils 2 (two) times daily.     calcium acetate (PHOSLO) 667 MG capsule Take 3 capsules (2,001 mg total) by mouth 3 (three) times daily with meals. 100 capsule 0   carvedilol (COREG) 25 MG tablet Take 1 tablet (25 mg total) by mouth 2 (two) times daily with a meal. 60 tablet 1   clopidogrel (PLAVIX) 75 MG tablet Take 1 tablet (75 mg total) by mouth daily. 90 tablet 3    doxycycline (ADOXA) 100 MG tablet Take 1 tablet (100 mg total) by mouth 2 (  two) times daily for 7 days. 14 tablet 0   FEROSUL 325 (65 Fe) MG tablet Take 325 mg by mouth daily.     fluticasone (FLONASE) 50 MCG/ACT nasal spray Place 2 sprays into both nostrils daily. (Patient not taking: Reported on 02/04/2021)     HYDROcodone-acetaminophen (NORCO/VICODIN) 5-325 MG tablet Take 2 tablets by mouth 2 (two) times daily.     lidocaine-prilocaine (EMLA) cream Apply 1 application topically Every Tuesday,Thursday,and Saturday with dialysis.  (Patient not taking: Reported on 02/04/2021)     losartan (COZAAR) 25 MG tablet Take 25 mg by mouth daily. (Patient not taking: Reported on 02/04/2021)     nitroGLYCERIN (NITROSTAT) 0.4 MG SL tablet Place 1 tablet (0.4 mg total) under the tongue every 5 (five) minutes as needed for chest pain.     prednisoLONE acetate (PRED FORTE) 1 % ophthalmic suspension Place 1 drop into the left eye every 4 (four) hours.     tamsulosin (FLOMAX) 0.4 MG CAPS capsule Take 0.4 mg by mouth daily after breakfast.       There are no Patient Instructions on file for this visit. No follow-ups on file.   Kris Hartmann, NP

## 2021-02-05 NOTE — Assessment & Plan Note (Addendum)
improved

## 2021-02-05 NOTE — Progress Notes (Signed)
Pharmacy Antibiotic Note  [name]. is a 50 y.o. male arriving from [facility] ED, admitted  on 02/04/2021 with  osteomyelitis .  Pharmacy has been consulted for vanc and cefepime dosing.  Pt presented with hand wound with vascular procedure plan for 12/15 at [facility]. Pt had fevers and chills. Pt is on a TTS dialysis schedule. He got 1 dose of cefepime in the ED. Vanc is also ordered.   Plan: F/u in AM with HD schedule & CV procedure for further vancomycin and cefepime dosing.   Temp (24hrs), Avg:98.3 F (36.8 C), Min:97.9 F (36.6 C), Max:98.6 F (37 C)  Recent Labs  Lab 01/29/21 0735 01/30/21 0412 02/04/21 1628  WBC 10.3 10.9* 10.8*  CREATININE 13.07* 9.15* 17.99*  LATICACIDVEN  --   --  0.6     Estimated Creatinine Clearance: 5.7 mL/min (A) (by C-G formula based on SCr of 17.99 mg/dL (H)).    Allergies  Allergen Reactions   Levofloxacin Swelling    Of face Other reaction(s): swelling   Promethazine Diarrhea and Other (See Comments)    Other reaction(s): Other (See Comments), Other (See Comments) Other reaction(s): Muscle cramps Muscle cramps Cramping Other reaction(s): Muscle cramps Muscle cramps   Other     Other reaction(s): Unknown   Malt Other (See Comments)    Other reaction(s): cramping    Antimicrobials this admission: 12/14 vanc>> 12/14 cefepime >>   Microbiology results: 12/14 blood>>  Renda Rolls, PharmD, MBA 02/05/2021 12:00 AM

## 2021-02-05 NOTE — Assessment & Plan Note (Signed)
Electrolyte abnormalities per renal

## 2021-02-06 ENCOUNTER — Encounter
Admission: AD | Disposition: A | Discharge status: Other Acute Inpatient Hospital | Payer: Self-pay | Source: Other Acute Inpatient Hospital | Attending: Family Medicine

## 2021-02-06 ENCOUNTER — Other Ambulatory Visit (INDEPENDENT_AMBULATORY_CARE_PROVIDER_SITE_OTHER): Payer: Self-pay | Admitting: Vascular Surgery

## 2021-02-06 DIAGNOSIS — T82898A Other specified complication of vascular prosthetic devices, implants and grafts, initial encounter: Secondary | ICD-10-CM

## 2021-02-06 DIAGNOSIS — N185 Chronic kidney disease, stage 5: Secondary | ICD-10-CM

## 2021-02-06 DIAGNOSIS — E538 Deficiency of other specified B group vitamins: Secondary | ICD-10-CM

## 2021-02-06 HISTORY — PX: EMBOLIZATION: CATH118239

## 2021-02-06 LAB — CBC WITH DIFFERENTIAL/PLATELET
Abs Immature Granulocytes: 0.05 10*3/uL (ref 0.00–0.07)
Basophils Absolute: 0.1 10*3/uL (ref 0.0–0.1)
Basophils Relative: 1 %
Eosinophils Absolute: 0.3 10*3/uL (ref 0.0–0.5)
Eosinophils Relative: 2 %
HCT: 20.6 % — ABNORMAL LOW (ref 39.0–52.0)
Hemoglobin: 6.5 g/dL — ABNORMAL LOW (ref 13.0–17.0)
Immature Granulocytes: 0 %
Lymphocytes Relative: 9 %
Lymphs Abs: 1.1 10*3/uL (ref 0.7–4.0)
MCH: 23.7 pg — ABNORMAL LOW (ref 26.0–34.0)
MCHC: 31.6 g/dL (ref 30.0–36.0)
MCV: 75.2 fL — ABNORMAL LOW (ref 80.0–100.0)
Monocytes Absolute: 1.6 10*3/uL — ABNORMAL HIGH (ref 0.1–1.0)
Monocytes Relative: 13 %
Neutro Abs: 8.8 10*3/uL — ABNORMAL HIGH (ref 1.7–7.7)
Neutrophils Relative %: 75 %
Platelets: 216 10*3/uL (ref 150–400)
RBC: 2.74 MIL/uL — ABNORMAL LOW (ref 4.22–5.81)
RDW: 18.7 % — ABNORMAL HIGH (ref 11.5–15.5)
WBC: 11.9 10*3/uL — ABNORMAL HIGH (ref 4.0–10.5)
nRBC: 0.2 % (ref 0.0–0.2)

## 2021-02-06 LAB — FOLATE: Folate: 4.3 ng/mL — ABNORMAL LOW (ref >5.9)

## 2021-02-06 LAB — COMPREHENSIVE METABOLIC PANEL
ALT: 11 U/L (ref 0–44)
AST: 11 U/L — ABNORMAL LOW (ref 15–41)
Albumin: 2.5 g/dL — ABNORMAL LOW (ref 3.5–5.0)
Alkaline Phosphatase: 57 U/L (ref 38–126)
Anion gap: 11 (ref 5–15)
BUN: 49 mg/dL — ABNORMAL HIGH (ref 6–20)
CO2: 22 mmol/L (ref 22–32)
Calcium: 8.1 mg/dL — ABNORMAL LOW (ref 8.9–10.3)
Chloride: 108 mmol/L (ref 98–111)
Creatinine, Ser: 10.72 mg/dL — ABNORMAL HIGH (ref 0.61–1.24)
GFR, Estimated: 5 mL/min — ABNORMAL LOW (ref >60)
Glucose, Bld: 97 mg/dL (ref 70–99)
Potassium: 4.1 mmol/L (ref 3.5–5.1)
Sodium: 141 mmol/L (ref 135–145)
Total Bilirubin: 0.8 mg/dL (ref 0.3–1.2)
Total Protein: 5.9 g/dL — ABNORMAL LOW (ref 6.5–8.1)

## 2021-02-06 LAB — IRON AND TIBC
Iron: 50 ug/dL (ref 45–182)
Saturation Ratios: 51 % — ABNORMAL HIGH (ref 17.9–39.5)
TIBC: 98 ug/dL — ABNORMAL LOW (ref 250–450)
UIBC: 48 ug/dL

## 2021-02-06 LAB — HEPATITIS B SURFACE ANTIBODY, QUANTITATIVE: Hep B S AB Quant (Post): 38.3 m[IU]/mL (ref >9.9)

## 2021-02-06 LAB — VITAMIN B12: Vitamin B-12: 270 pg/mL (ref 180–914)

## 2021-02-06 LAB — FERRITIN: Ferritin: 597 ng/mL — ABNORMAL HIGH (ref 24–336)

## 2021-02-06 LAB — MAGNESIUM: Magnesium: 1.8 mg/dL (ref 1.7–2.4)

## 2021-02-06 LAB — PHOSPHORUS: Phosphorus: 6.2 mg/dL — ABNORMAL HIGH (ref 2.5–4.6)

## 2021-02-06 LAB — PREPARE RBC (CROSSMATCH)

## 2021-02-06 SURGERY — EMBOLIZATION
Anesthesia: Moderate Sedation | Laterality: Right

## 2021-02-06 MED ORDER — SODIUM CHLORIDE 0.9% IV SOLUTION: Freq: Once | INTRAVENOUS | Status: AC

## 2021-02-06 MED ORDER — MIDAZOLAM HCL 2 MG/ML PO SYRP
8.0000 mg | ORAL_SOLUTION | Freq: Once | ORAL | Status: DC | PRN
  Filled 2021-02-06: qty 4

## 2021-02-06 MED ORDER — HEPARIN SODIUM (PORCINE) 1000 UNIT/ML DIALYSIS: 1000.0000 [IU] | INTRAMUSCULAR | Status: DC | PRN

## 2021-02-06 MED ORDER — PENTAFLUOROPROP-TETRAFLUOROETH EX AERO: 1.0000 "application " | INHALATION_SPRAY | CUTANEOUS | Status: DC | PRN

## 2021-02-06 MED ORDER — SODIUM CHLORIDE 0.9 % IV SOLN: INTRAVENOUS | Status: DC

## 2021-02-06 MED ORDER — ALTEPLASE 2 MG IJ SOLR: 2.0000 mg | Freq: Once | INTRAMUSCULAR | Status: DC | PRN

## 2021-02-06 MED ORDER — SODIUM CHLORIDE 0.9 % IV SOLN: 100.0000 mL | INTRAVENOUS | Status: DC | PRN

## 2021-02-06 MED ORDER — FOLIC ACID 1 MG PO TABS
1.0000 mg | ORAL_TABLET | Freq: Every day | ORAL | Status: DC
  Administered 2021-02-06 – 2021-02-08 (×3): 1 mg via ORAL
  Filled 2021-02-06 (×3): qty 1

## 2021-02-06 MED ORDER — CEFAZOLIN SODIUM-DEXTROSE 1-4 GM/50ML-% IV SOLN
1.0000 g | Freq: Once | INTRAVENOUS | Status: AC
  Administered 2021-02-06: 1 g via INTRAVENOUS

## 2021-02-06 MED ORDER — FENTANYL CITRATE PF 50 MCG/ML IJ SOSY
PREFILLED_SYRINGE | INTRAMUSCULAR | Status: AC
  Filled 2021-02-06: qty 2

## 2021-02-06 MED ORDER — ARTIFICIAL TEARS OPHTHALMIC OINT
TOPICAL_OINTMENT | Freq: Four times a day (QID) | OPHTHALMIC | Status: DC
  Filled 2021-02-06: qty 3.5

## 2021-02-06 MED ORDER — FENTANYL CITRATE (PF) 100 MCG/2ML IJ SOLN
INTRAMUSCULAR | Status: DC | PRN
  Administered 2021-02-06: 50 ug via INTRAVENOUS
  Administered 2021-02-06: 25 ug via INTRAVENOUS

## 2021-02-06 MED ORDER — LIDOCAINE HCL (PF) 1 % IJ SOLN: 5.0000 mL | INTRAMUSCULAR | Status: DC | PRN

## 2021-02-06 MED ORDER — HYDROMORPHONE HCL 1 MG/ML IJ SOLN
1.0000 mg | Freq: Once | INTRAMUSCULAR | Status: AC | PRN
  Administered 2021-02-07: 1 mg via INTRAVENOUS
  Filled 2021-02-06: qty 1

## 2021-02-06 MED ORDER — FAMOTIDINE 20 MG PO TABS: 40.0000 mg | ORAL_TABLET | Freq: Once | ORAL | Status: DC | PRN

## 2021-02-06 MED ORDER — DIPHENHYDRAMINE HCL 50 MG/ML IJ SOLN: 50.0000 mg | Freq: Once | INTRAMUSCULAR | Status: DC | PRN

## 2021-02-06 MED ORDER — IODIXANOL 320 MG/ML IV SOLN
INTRAVENOUS | Status: DC | PRN
  Administered 2021-02-06: 40 mL via INTRA_ARTERIAL

## 2021-02-06 MED ORDER — LIDOCAINE-PRILOCAINE 2.5-2.5 % EX CREA: 1.0000 "application " | TOPICAL_CREAM | CUTANEOUS | Status: DC | PRN

## 2021-02-06 MED ORDER — MIDAZOLAM HCL 5 MG/5ML IJ SOLN
INTRAMUSCULAR | Status: AC
  Filled 2021-02-06: qty 5

## 2021-02-06 MED ORDER — VITAMIN B-12 1000 MCG PO TABS
1000.0000 ug | ORAL_TABLET | Freq: Every day | ORAL | Status: DC
  Administered 2021-02-07 – 2021-02-08 (×2): 1000 ug via ORAL
  Filled 2021-02-06 (×2): qty 1

## 2021-02-06 MED ORDER — MIDAZOLAM HCL 2 MG/2ML IJ SOLN
INTRAMUSCULAR | Status: DC | PRN
  Administered 2021-02-06: 1 mg via INTRAVENOUS
  Administered 2021-02-06: 2 mg via INTRAVENOUS

## 2021-02-06 MED ORDER — METHYLPREDNISOLONE SODIUM SUCC 125 MG IJ SOLR: 125.0000 mg | Freq: Once | INTRAMUSCULAR | Status: DC | PRN

## 2021-02-06 MED ORDER — HEPARIN SODIUM (PORCINE) 1000 UNIT/ML IJ SOLN
INTRAMUSCULAR | Status: AC
  Filled 2021-02-06: qty 10

## 2021-02-06 SURGICAL SUPPLY — 29 items
ADH SKN CLS APL DERMABOND .7 (GAUZE/BANDAGES/DRESSINGS) ×1
BIOPATCH RED 1 DISK 7.0 (GAUZE/BANDAGES/DRESSINGS) ×1 IMPLANT
BIOPATCH RED 1IN DISK 7.0MM (GAUZE/BANDAGES/DRESSINGS) ×1
CANNULA 5F STIFF (CANNULA) ×2 IMPLANT
CATH BEACON 5 .035 40 KMP TP (CATHETERS) IMPLANT
CATH BEACON 5 .038 40 KMP TP (CATHETERS) ×2
CATH CANNON HEMO 15FR 23CM (HEMODIALYSIS SUPPLIES) ×2 IMPLANT
CATH GLIDEPATH RETRO 14.5X19 (CATHETERS) IMPLANT
CATH MICROCATH PRGRT 2.8F 110 (CATHETERS) IMPLANT
CATH TEMPO 5F RIM 65CM (CATHETERS) ×2 IMPLANT
COIL 400 COMPLEX STD 32X60CM (Vascular Products) ×2 IMPLANT
COIL 400 COMPLEX STD 36X60 (Embolic) ×2 IMPLANT
COIL 400 COMPLEX STD 40X60CM (Embolic) ×10 IMPLANT
COVER PROBE U/S 5X48 (MISCELLANEOUS) ×4 IMPLANT
DERMABOND ADVANCED (GAUZE/BANDAGES/DRESSINGS) ×2
DERMABOND ADVANCED .7 DNX12 (GAUZE/BANDAGES/DRESSINGS) IMPLANT
DEVICE OCCLUSION POD10 (Embolic) IMPLANT
DEVICE OCCLUSION PODJ60 (Vascular Products) IMPLANT
DRAPE BRACHIAL (DRAPES) ×2 IMPLANT
GLIDEWIRE ADV .035X180CM (WIRE) ×2 IMPLANT
GUIDEWIRE SUPER STIFF .035X180 (WIRE) ×2 IMPLANT
HANDLE DETACHMENT COIL (MISCELLANEOUS) ×2 IMPLANT
MICROCATH PROGREAT 2.8F 110 CM (CATHETERS) ×6
OCCLUSION DEVICE POD10 (Embolic) ×6 IMPLANT
OCCLUSION DEVICE PODJ60 (Vascular Products) ×15 IMPLANT
PACK ANGIOGRAPHY (CUSTOM PROCEDURE TRAY) ×3 IMPLANT
SHEATH BRITE TIP 6FRX5.5 (SHEATH) ×2 IMPLANT
SUT MNCRL AB 4-0 PS2 18 (SUTURE) ×4 IMPLANT
SUT PROLENE 0 CT 1 30 (SUTURE) ×2 IMPLANT

## 2021-02-06 NOTE — Care Management Important Message (Signed)
Important Message  Patient Details  Name: [name]. MRN: [id number] Date of Birth: Jan 29, 1971   Medicare Important Message Given:  N/A - LOS <3 / Initial given by admissions     [name] 02/06/2021, 9:28 AM

## 2021-02-06 NOTE — Assessment & Plan Note (Signed)
replace

## 2021-02-06 NOTE — Assessment & Plan Note (Addendum)
S/p 2 unit pRBC, inappropriate bump Follow, stable today retics hypoprolif Labs with AOCD and folate def, low normal B12 - supplement B12 and follow MMA

## 2021-02-06 NOTE — Interval H&P Note (Signed)
History and Physical Interval Note:  02/06/2021 1:59 PM  [name].  has presented today for surgery, with the diagnosis of ESRD.  The various methods of treatment have been discussed with the patient and family. After consideration of risks, benefits and other options for treatment, the patient has consented to  Procedure(s) with comments: EMBOLIZATION (Right) - Right Upper Extremity Dialysis Access, Permcath Placement as a surgical intervention.  The patient's history has been reviewed, patient examined, no change in status, stable for surgery.  I have reviewed the patient's chart and labs.  Questions were answered to the patient's satisfaction.     [name]

## 2021-02-06 NOTE — Assessment & Plan Note (Signed)
S/p coil embolization of right brachiocephalic AV fistula

## 2021-02-06 NOTE — Op Note (Signed)
[facility]    OPERATIVE NOTE   PROCEDURE: 1.   Right brachiocephalic arteriovenous fistula cannulation under ultrasound guidance 2.   Right arm fistulagram including central venogram 3.   Coil embolization of the right brachiocephalic AV fistula as well as a large cephalic vein branch  PRE-OPERATIVE DIAGNOSIS: 1. ESRD 2.  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 is a 50 y.o. male who presents with steal syndrome with a right brachiocephalic AV fistula even after banding.  We will need to occlude the arteriovenous fistula.  The patient is scheduled for right arm fistulagram.  The patient is aware the risks include but are not limited to: bleeding, infection, thrombosis of the cannulated access, and possible anaphylactic reaction to the contrast.  The patient is aware of the risks of the procedure and elects to proceed forward.  DESCRIPTION: After full informed written consent was obtained, the patient was brought back to the angiography suite and placed supine upon the angiography table.  The patient was connected to monitoring equipment. Moderate conscious sedation was administered with a face to face encounter with the patient throughout the procedure with my supervision of the RN administering medicines and monitoring the patient's vital signs and mental status throughout from the start of the procedure until the patient was taken to the recovery room. The right was prepped and draped in the standard fashion for a  percutaneous access intervention.  Under ultrasound guidance, the proximal upper arm portion of the right brachiocephalic arteriovenous fistula was cannulated with a micropuncture needle under direct ultrasound guidance and a retrograde fashion where it was patent and a permanent image was performed.  The microwire was advanced into the fistula and the needle was exchanged for the a microsheath.  I then upsized to a 6 Fr Sheath and imaging was performed.  I then crossed the banded area and got into the initial portion of the right brachiocephalic AV fistula.  There was a large competing branch that was helping keep the fistula open despite the banding.  I then cannulated this with a prograde microcatheter and deployed to 10 mm diameter by 60 cm length coils to successfully embolize the branch.  I then got to the main large AV fistula.  A total of 11 coils were deployed in this area including seven 24 x 60 coils and 4 packing coils.  Following this, there is no longer a thrill in the proximal upper arm beyond the banded area in the coiled area.    Based on the completion imaging, no further intervention is necessary.  The wire and balloon were removed from the sheath.  A 4-0 Monocryl purse-string suture was sewn around the sheath.  The sheath was removed while tying down the suture.  A sterile bandage was applied to the puncture site.  COMPLICATIONS: None  CONDITION: Stable   [name]  02/06/2021 3:26 PM   This note was created with Dragon Medical transcription system. Any errors in dictation are purely unintentional.

## 2021-02-06 NOTE — Progress Notes (Signed)
[facility]  ROUNDING NOTE   Subjective:   [name] Kayveon Lennartz. is a 50 year old male with a past medical history significant for diabetes, hypertension, diastolic CHF, CAD with STEMI January 2022, and end stage renal disease on dialysis. Patient presents to emergency room from [facility] for orthopedic evaluation. Patient has been admitted for Osteomyelitis [facility]) [M86.9] Wound infection [T14.8XXA, L08.9]   Patient is known to our clinic and receives outpatient treatment from [facility] on a TTS schedule. He is supervised by Dr Juleen China.   Patient seen resting in bed Spouse at bedside Currently NPO for vascular procedure Patient and spouse have a lot of questions about PD vs HD Answered these questions to the best of my ability  Dialysis yesterday, tolerated well    Objective:  Vital signs in last 24 hours:  Temp:  [97.9 F (36.6 C)-100.1 F (37.8 C)] 98.6 F (37 C) (12/16 0729) Pulse Rate:  [64-86] 64 (12/16 0729) Resp:  [15-20] 18 (12/16 0729) BP: (124-255)/(63-93) 124/63 (12/16 0729) SpO2:  [96 %-100 %] 96 % (12/16 0729) Weight:  [93.1 kg] 93.1 kg (12/15 1252)  Weight change:  Filed Weights   02/05/21 1252  Weight: 93.1 kg     Intake/Output: I/O last 3 completed shifts: In: 680 [P.O.:680] Out: -114    Intake/Output this shift:  No intake/output data recorded.  Physical Exam: General: NAD, resting comfortably  Head: Normocephalic, atraumatic. Moist oral mucosal membranes  Eyes: Anicteric  Lungs:  Clear to auscultation, normal effort  Heart: Regular rate and rhythm  Abdomen:  Soft, nontender  Extremities:  1+ peripheral edema.  Neurologic: Nonfocal, moving all four extremities  Skin:  Right 4th finger wound dressing  Access: LUE AVF    Basic Metabolic Panel: Recent Labs  Lab 02/04/21 1628 02/04/21 2340 02/05/21 0143 02/06/21 0433  NA 149*  --  146* 141  K 5.3*  --  5.8* 4.1  CL 121*  --  120* 108  CO2 10*  --  10* 22  GLUCOSE  70  --  85 97  BUN 79*  --  82* 49*  CREATININE 17.99*  --  17.53* 10.72*  CALCIUM 8.5*  --  8.3* 8.1*  MG  --  2.5*  --  1.8  PHOS  --  11.5*  --  6.2*     Liver Function Tests: Recent Labs  Lab 02/04/21 1628 02/06/21 0433  AST 11* 11*  ALT 14 11  ALKPHOS 64 57  BILITOT 0.2* 0.8  PROT 7.0 5.9*  ALBUMIN 3.0* 2.5*    No results for input(s): LIPASE, AMYLASE in the last 168 hours. No results for input(s): AMMONIA in the last 168 hours.  CBC: Recent Labs  Lab 02/04/21 1628 02/05/21 0143 02/06/21 0433  WBC 10.8* 12.6* 11.9*  NEUTROABS 7.5  --  8.8*  HGB 7.4* 7.2* 6.5*  HCT 25.5* 23.5* 20.6*  MCV 81.0 78.3* 75.2*  PLT 270 257 216     Cardiac Enzymes: No results for input(s): CKTOTAL, CKMB, CKMBINDEX, TROPONINI in the last 168 hours.  BNP: Invalid input(s): POCBNP  CBG: Recent Labs  Lab 01/30/21 1209  GLUCAP 81     Microbiology: Results for orders placed or performed during the hospital encounter of 02/04/21  CULTURE, BLOOD (ROUTINE X 2) w Reflex to ID Panel     Status: None (Preliminary result)   Collection Time: 02/04/21 11:40 PM   Specimen: BLOOD  Result Value Ref Range Status   Specimen Description BLOOD LEFT ASSIST CONTROL  Final   Special Requests   Final    BOTTLES DRAWN AEROBIC AND ANAEROBIC Blood Culture adequate volume   Culture   Final    NO GROWTH < 12 HOURS Performed at [facility], [address] [place], [place] [address]    Report Status PENDING  Incomplete  CULTURE, BLOOD (ROUTINE X 2) w Reflex to ID Panel     Status: None (Preliminary result)   Collection Time: 02/04/21 11:40 PM   Specimen: BLOOD  Result Value Ref Range Status   Specimen Description BLOOD LEFT HAND  Final   Special Requests   Final    BOTTLES DRAWN AEROBIC AND ANAEROBIC Blood Culture adequate volume   Culture   Final    NO GROWTH < 12 HOURS Performed at [facility], [address] [place], [place] [address]    Report Status PENDING   Incomplete  MRSA Next Gen by PCR, Nasal     Status: None   Collection Time: 02/05/21  1:24 AM   Specimen: Nasal Mucosa; Nasal Swab  Result Value Ref Range Status   MRSA by PCR Next Gen NOT DETECTED NOT DETECTED Final    Comment: (NOTE) The GeneXpert MRSA Assay (FDA approved for NASAL specimens only), is one component of a comprehensive MRSA colonization surveillance program. It is not intended to diagnose MRSA infection nor to guide or monitor treatment for MRSA infections. Test performance is not FDA approved in patients less than 52 years old. Performed at [facility], [address] [place], [place] [address]     Coagulation Studies: No results for input(s): LABPROT, INR in the last 72 hours.  Urinalysis: No results for input(s): COLORURINE, LABSPEC, PHURINE, GLUCOSEU, HGBUR, BILIRUBINUR, KETONESUR, PROTEINUR, UROBILINOGEN, NITRITE, LEUKOCYTESUR in the last 72 hours.  Invalid input(s): APPERANCEUR    Imaging: PERIPHERAL VASCULAR CATHETERIZATION  Result Date: 02/05/2021 See surgical note for result.  DG Hand Complete Right  Result Date: 02/04/2021 CLINICAL DATA:  Right hand wound, fever.  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 New destructive changes involving the base of the ring finger proximal phalanx compatible with acute osteomyelitis. No fracture or dislocation. Osseous structures are demineralized. Severe small vessel atherosclerotic calcifications. Prominent soft tissue swelling of the hand. IMPRESSION: New destructive changes involving the base of the ring finger proximal phalanx compatible with acute osteomyelitis. Electronically Signed   By: Davina Poke D.O.   On: 02/04/2021 16:49     Medications:    ceFEPime (MAXIPIME) IV 1 g (02/05/21 1957)   vancomycin 1,000 mg (02/05/21 1757)    amiodarone  200 mg  Oral Daily   atorvastatin  80 mg Oral QHS   calcium acetate  2,001 mg Oral TID WC   carvedilol  25 mg Oral BID WC   folic acid  1 mg Oral Daily   heparin injection (subcutaneous)  5,000 Units Subcutaneous Q8H   methocarbamol  500 mg Oral Once   metroNIDAZOLE  500 mg Oral Q8H   acetaminophen **OR** acetaminophen, HYDROcodone-acetaminophen, labetalol, morphine injection, nitroGLYCERIN, ondansetron (ZOFRAN) IV  Assessment/ Plan:  [name]. is a 50 y.o.  male with a past medical history significant for diabetes, hypertension, diastolic CHF, CAD with STEMI January 2022, and end stage renal disease on dialysis. Patiet presents to emergency room for further evaluation of right, fourth finger treatment. Patient has been admitted for  Osteomyelitis ([place]) [M86.9] Wound infection [T14.8XXA, L08.9]  [facility] TTS [place] Left AVF 93kg  End stage renal disease on dialysis. Received dialysis yesterday, no UF, tolerated well. Next treatment scheduled for Saturday. Will follow up with PD request outpatient.  2. Anemia of chronic kidney disease.  Lab Results  Component Value Date   HGB 6.5 (L) 02/06/2021  EPO 10000 units IV with treatments. Will defer transfusion to primary team.  3. Secondary Hyperparathyroidism:  Lab Results  Component Value Date   CALCIUM 8.1 (L) 02/06/2021   CAION 0.94 (L) 01/08/2021   PHOS 6.2 (H) 02/06/2021   Phosphorus elevated.  Continue calcium acetate with meals  4. Hypertension with chronic kidney disease. Home regimen includes losartan, carvedilol and amiodarone. Losartan currently held.  5. Osteomyelitis of Rt fourth digit. Currently on antibiotic therapy with Cefepime and Vancomycin. Underwent right upper extremity angiogram today showing steal with majority blood flow going to fistula and little going to hand. Angioplasty balloon used to remove occlusion and allow small amount of blood flow to hand.   -Vascular to perform ligation today and place  a Permcath for HD access.    LOS: 2   12/16/202211:01 AM

## 2021-02-06 NOTE — Progress Notes (Signed)
Pharmacy Antibiotic Note  [name]. is a 50 y.o. male arriving from [facility] ED, admitted  on 02/04/2021 with  osteomyelitis .  Pharmacy has been consulted for vanc and cefepime dosing.  Pt presented with hand wound with vascular procedure plan for 12/15 at [facility]. Pt had fevers and chills. Pt is on a TTS dialysis schedule. He received vancomycin 1500mg  x 1 dose.   -anticipating further hand surgery to occur as outpatient  Plan: -Vancomycin: vancomycin 1g IV to be given with HD on HD days- Tues, Thur,Sat.  Will need to monitor HD schedule inpatient and make sure patient receives vancomycin doses appropriately.   -Cefepime: Will order Cefepime 1g IV every 24 hours    Temp (24hrs), Avg:99.3 F (37.4 C), Min:98.2 F (36.8 C), Max:100.3 F (37.9 C)  Recent Labs  Lab 02/04/21 1628 02/05/21 0143 02/06/21 0433  WBC 10.8* 12.6* 11.9*  CREATININE 17.99* 17.53* 10.72*  LATICACIDVEN 0.6  --   --      Estimated Creatinine Clearance: 9.6 mL/min (A) (by C-G formula based on SCr of 10.72 mg/dL (H)).    Allergies  Allergen Reactions   Levofloxacin Swelling    Of face Other reaction(s): swelling   Promethazine Diarrhea and Other (See Comments)    Other reaction(s): Other (See Comments), Other (See Comments) Other reaction(s): Muscle cramps Muscle cramps Cramping Other reaction(s): Muscle cramps Muscle cramps   Other     Other reaction(s): Unknown   Malt Other (See Comments)    Other reaction(s): cramping    Antimicrobials this admission: 12/14 vanc>> 12/14 cefepime >>   Microbiology results: 12/14 blood>> NG <12 hrs 12/15 MRSA PCR neg  Noralee Space, PharmD Clinical Pharmacist 02/06/2021 1:31 PM

## 2021-02-06 NOTE — Progress Notes (Signed)
PROGRESS NOTE    [name].  QMV:[id number] DOB: [birth year] DOA: 02/04/2021 PCP: [name], MD  No chief complaint on file.   Brief Narrative:  50 yo with hx ESRD (T/Th), HTN, T2DM, HTN, HLD, gastroparesis, CAD who presents to the ED with right 4th digit osteomyelitis.  He's s/p previous partial amputation of R 4th finger, but is being admitted now for further evaluation by vascular and orthopedics inpatient.  Hospitalization from 01/29/2021 to 01/30/2021: Per discharge summary, patient presented for right fourth digit wound dehiscence at previous amputation site.  Patient did not have fever, swelling, bleeding.  Vascular was consulted and recommended further amputation to aid with wound healing as the wound is necrotic and will need debridement.  Surgery was scheduled for days out so patient is to be discharged home and will be set up to complete procedure as an outpatient on 02/03/2021.  He was discharged home with Augmentin and doxycycline until after surgery.    Assessment & Plan:   Principal Problem:   Wound infection Active Problems:   Hypertensive chronic kidney disease with stage 1 through stage 4 chronic kidney disease, or unspecified chronic kidney disease   Major depressive disorder, recurrent, unspecified (HCC)   Moderate protein-calorie malnutrition (HCC)   Secondary hyperparathyroidism of renal origin ([place])   Reactive depression   Gangrene of finger of right hand ([place])   Wound dehiscence, surgical, initial encounter   Osteomyelitis ([place])   Osteomyelitis of finger of right hand ([place])   ESRD on hemodialysis ([place])   Steal syndrome as complication of dialysis access ([place])   Anemia   Hyperphosphatemia   Hypernatremia   Folate deficiency   Metabolic acidosis   Hyperkalemia   Hypermagnesemia   Mixed hyperlipidemia   Essential hypertension, benign   Coronary artery disease   Blindness of left eye   AF (paroxysmal atrial fibrillation) (HCC)    Gastroparesis   Iron deficiency anemia, unspecified   Osteomyelitis of finger of right hand (HCC) Sed rate 97 CRP 3.6 Negative MRSA PCR Vancomycin/cefepime, add flagyl 12/8 cx with citrobacter youngae and proteus mirabilis and mixed anaerobic flora Plain films 12/14 with new destructive changes involving base of ring finger proximal phalanx compatible with acute osteo S/p right upper extremity angiogram with angioplasty of distal right ulnar artery and proximal right radial artery S/p coil embolization of the right brachiocephalic AV fistula as well as large cephalic vein branch and placement of tunneled HD cath Appreciate vascular and orthopedic recommendations.  Hand follow up will occur outpatient given they don't round or operate here. Consider ID involvement based on surgery  Steal syndrome as complication of dialysis access [facility]) S/p coil embolization of right brachiocephalic AV fistula  ESRD on hemodialysis [facility]) Missed Hx Tuesday Per renal - consider L AV fistula vs PD  Folate deficiency replace  Hypernatremia improved  Hyperphosphatemia Per renal with esrd, need for dialysis  Anemia Transfuse and follow, discussed risks/benefits Labs with AOCD and folate def, low normal B12 - supplement B12 and follow MMA  Hypermagnesemia Electrolyte abnormalities per renal  Hyperkalemia Improved with dialysis  Metabolic acidosis Resolved with HD  Mixed hyperlipidemia statin  Coronary artery disease Statin, coreg  Essential hypertension, benign coreg  AF (paroxysmal atrial fibrillation) ([place]) Continue amiodarone, coreg Discharged on eliquis on 11/18, but looks like anticoagulation has fallen off (not on 12/9 d/c summary)? Plavix and eliquis were being held in preparation for surgery.  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 C/o discomfort,  will discuss with ophtho -> recommended nonmedicated ointment, abx (which he's already on) and  outpatient oculoplastics follow up  Iron deficiency anemia, unspecified Anemia related to esrd, will repeat anemia labs  Gastroparesis noted   DVT prophylaxis: heparin Code Status: full Family Communication: wife at bedside Disposition:   Status is: Observation  The patient will require care spanning > 2 midnights and should be moved to inpatient because: need for vascular and orthopedic eval and management      Consultants:  Vascular ortho  Procedures:  12/15 PROCEDURE PERFORMED: 1. Ultrasound guidance vascular access to right femoral artery. 2. Catheter placement to right radial artery and right ulnar arteries     from right femoral approach. 3. Thoracic aortogram and selective right upper extremity angiogram     including selective images of the radial and ulnar arteries. 4.  Percutaneous transluminal of distal right ulnar artery with 2.5 mm diameter angioplasty balloon 5.  Percutaneous transluminal angioplasty of the proximal right radial artery with 3 mm diameter angioplasty balloon 5. StarClose closure device right femoral artery.  Antimicrobials:  Anti-infectives (From admission, onward)    Start     Dose/Rate Route Frequency Ordered Stop   02/07/21 0000  ceFAZolin (ANCEF) IVPB 1 g/50 mL premix       Note to Pharmacy: To be given in specials   1 g 100 mL/hr over 30 Minutes Intravenous  Once 02/06/21 1339 02/06/21 1446   02/05/21 1800  ceFEPIme (MAXIPIME) 1 g in sodium chloride 0.9 % 100 mL IVPB        1 g 200 mL/hr over 30 Minutes Intravenous Every 24 hours 02/05/21 1126     02/05/21 1530  metroNIDAZOLE (FLAGYL) tablet 500 mg        500 mg Oral Every 8 hours 02/05/21 1435     02/05/21 1400  vancomycin (VANCOCIN) 1,000 mg in sodium chloride 0.9 % 150 mL IVPB  Status:  Discontinued        1,000 mg 150 mL/hr over 60 Minutes Intravenous Every Dialysis 02/05/21 1126 02/05/21 1306   02/05/21 1400  vancomycin (VANCOCIN) IVPB 1000 mg/200 mL premix        1,000  mg 200 mL/hr over 60 Minutes Intravenous Every T-Th-Sa (Hemodialysis) 02/05/21 1306     02/05/21 0815  ceFAZolin (ANCEF) IVPB 2g/100 mL premix        2 g 200 mL/hr over 30 Minutes Intravenous On call to O.R. 02/05/21 0759 02/05/21 0931   02/05/21 0012  vancomycin variable dose per unstable renal function (pharmacist dosing)  Status:  Discontinued         Does not apply See admin instructions 02/05/21 0013 02/05/21 1126       Subjective: L eye socket irritation  Objective: Vitals:   02/06/21 1135 02/06/21 1153 02/06/21 1354 02/06/21 1537  BP: 126/65 126/64 (!) 143/69 (!) 158/81  Pulse: 75 71 66   Resp: 18 18 15 20   Temp: 99 F (37.2 C) 100.3 F (37.9 C) 98.4 F (36.9 C)   TempSrc: Oral Oral Oral   SpO2: 98% 100% 96% 97%  Weight:        Intake/Output Summary (Last 24 hours) at 02/06/2021 1858 Last data filed at 02/06/2021 1500 Gross per 24 hour  Intake 632 ml  Output --  Net 632 ml   Filed Weights   02/05/21 1252  Weight: 93.1 kg    Examination:  General: No acute distress. L eye s/p enucleation with med por - no drainage Cardiovascular: RRR  Lungs: unlabored Abdomen: Soft, nontender, nondistended  Neurological: Alert and oriented 3. Moves all extremities 4 . Cranial nerves II through XII grossly intact. Skin: Warm and dry. No rashes or lesions. Extremities: RUE with intact dressing  Data Reviewed: I have personally reviewed following labs and imaging studies  CBC: Recent Labs  Lab 02/04/21 1628 02/05/21 0143 02/06/21 0433  WBC 10.8* 12.6* 11.9*  NEUTROABS 7.5  --  8.8*  HGB 7.4* 7.2* 6.5*  HCT 25.5* 23.5* 20.6*  MCV 81.0 78.3* 75.2*  PLT 270 257 599    Basic Metabolic Panel: Recent Labs  Lab 02/04/21 1628 02/04/21 2340 02/05/21 0143 02/06/21 0433  NA 149*  --  146* 141  K 5.3*  --  5.8* 4.1  CL 121*  --  120* 108  CO2 10*  --  10* 22  GLUCOSE 70  --  85 97  BUN 79*  --  82* 49*  CREATININE 17.99*  --  17.53* 10.72*  CALCIUM 8.5*  --   8.3* 8.1*  MG  --  2.5*  --  1.8  PHOS  --  11.5*  --  6.2*    GFR: Estimated Creatinine Clearance: 9.6 mL/min (Sofie Schendel) (by C-G formula based on SCr of 10.72 mg/dL (H)).  Liver Function Tests: Recent Labs  Lab 02/04/21 1628 02/06/21 0433  AST 11* 11*  ALT 14 11  ALKPHOS 64 57  BILITOT 0.2* 0.8  PROT 7.0 5.9*  ALBUMIN 3.0* 2.5*    CBG: No results for input(s): GLUCAP in the last 168 hours.    Recent Results (from the past 240 hour(s))  Body fluid culture w Gram Stain     Status: None   Collection Time: 01/29/21  1:06 AM   Specimen: Hand; Body Fluid  Result Value Ref Range Status   Specimen Description   Final    HAND LEFT Performed at [facility], [address] [place], [place] [address]    Special Requests   Final    NONE Performed at [facility], [place], [place] [address]    Gram Stain   Final    ABUNDANT WBC PRESENT, PREDOMINANTLY PMN MODERATE GRAM POSITIVE COCCI MODERATE GRAM NEGATIVE RODS RARE GRAM POSITIVE RODS Performed at [facility], [place] [address], [place], [place] [address]    Culture   Final    MODERATE CITROBACTER YOUNGAE FEW PROTEUS MIRABILIS MIXED ANAEROBIC FLORA PRESENT.  CALL LAB IF FURTHER IID REQUIRED.    Report Status 01/31/2021 FINAL  Final   Organism ID, Bacteria CITROBACTER YOUNGAE  Final   Organism ID, Bacteria PROTEUS MIRABILIS  Final      Susceptibility   Citrobacter youngae - MIC*    CEFAZOLIN >=64 RESISTANT Resistant     CEFEPIME <=0.12 SENSITIVE Sensitive     CEFTAZIDIME <=1 SENSITIVE Sensitive     CEFTRIAXONE <=0.25 SENSITIVE Sensitive     CIPROFLOXACIN <=0.25 SENSITIVE Sensitive     GENTAMICIN <=1 SENSITIVE Sensitive     IMIPENEM <=0.25 SENSITIVE Sensitive     TRIMETH/SULFA <=20 SENSITIVE Sensitive     PIP/TAZO <=4 SENSITIVE Sensitive     * MODERATE CITROBACTER YOUNGAE   Proteus mirabilis - MIC*    AMPICILLIN <=2 SENSITIVE Sensitive     CEFAZOLIN <=4 SENSITIVE Sensitive      CEFEPIME <=0.12 SENSITIVE Sensitive     CEFTAZIDIME <=1 SENSITIVE Sensitive     CEFTRIAXONE <=0.25 SENSITIVE Sensitive     CIPROFLOXACIN <=0.25 SENSITIVE Sensitive  GENTAMICIN <=1 SENSITIVE Sensitive     IMIPENEM 2 SENSITIVE Sensitive     TRIMETH/SULFA <=20 SENSITIVE Sensitive     AMPICILLIN/SULBACTAM <=2 SENSITIVE Sensitive     PIP/TAZO <=4 SENSITIVE Sensitive     * FEW PROTEUS MIRABILIS  Resp Panel by RT-PCR (Flu Tavian Callander&B, Covid) Hand, Left     Status: None   Collection Time: 01/29/21  1:06 AM   Specimen: Hand, Left; Nasopharyngeal(NP) swabs in vial transport medium  Result Value Ref Range Status   SARS Coronavirus 2 by RT PCR NEGATIVE NEGATIVE Final    Comment: (NOTE) SARS-CoV-2 target nucleic acids are NOT DETECTED.  The SARS-CoV-2 RNA is generally detectable in upper respiratory specimens during the acute phase of infection. The lowest concentration of SARS-CoV-2 viral copies this assay can detect is 138 copies/mL. Bryer Gottsch negative result does not preclude SARS-Cov-2 infection and should not be used as the sole basis for treatment or other patient management decisions. Amere Iott negative result may occur with  improper specimen collection/handling, submission of specimen other than nasopharyngeal swab, presence of viral mutation(s) within the areas targeted by this assay, and inadequate number of viral copies(<138 copies/mL). Winni Ehrhard negative result must be combined with clinical observations, patient history, and epidemiological information. The expected result is Negative.  Fact Sheet for Patients:  EntrepreneurPulse.com.au  Fact Sheet for Healthcare Providers:  IncredibleEmployment.be  This test is no t yet approved or cleared by the [place] FDA and  has been authorized for detection and/or diagnosis of SARS-CoV-2 by FDA under an Emergency Use Authorization (EUA). This EUA will remain  in effect (meaning this test can be used) for the duration of  the COVID-19 declaration under Section 564(b)(1) of the Act, 21 U.S.C.section 360bbb-3(b)(1), unless the authorization is terminated  or revoked sooner.       Influenza Dakayla Disanti by PCR NEGATIVE NEGATIVE Final   Influenza B by PCR NEGATIVE NEGATIVE Final    Comment: (NOTE) The Xpert Xpress SARS-CoV-2/FLU/RSV plus assay is intended as an aid in the diagnosis of influenza from Nasopharyngeal swab specimens and should not be used as Darian Ace sole basis for treatment. Nasal washings and aspirates are unacceptable for Xpert Xpress SARS-CoV-2/FLU/RSV testing.  Fact Sheet for Patients: EntrepreneurPulse.com.au  Fact Sheet for Healthcare Providers: IncredibleEmployment.be  This test is not yet approved or cleared by the [place] FDA and has been authorized for detection and/or diagnosis of SARS-CoV-2 by FDA under an Emergency Use Authorization (EUA). This EUA will remain in effect (meaning this test can be used) for the duration of the COVID-19 declaration under Section 564(b)(1) of the Act, 21 U.S.C. section 360bbb-3(b)(1), unless the authorization is terminated or revoked.  Performed at [facility], [address] [place], [place] [address]   Culture, blood (routine x 2)     Status: None (Preliminary result)   Collection Time: 02/04/21  4:28 PM   Specimen: BLOOD  Result Value Ref Range Status   Specimen Description BLOOD BLOOD LEFT HAND  Final   Special Requests   Final    BOTTLES DRAWN AEROBIC AND ANAEROBIC Blood Culture results may not be optimal due to an excessive volume of blood received in culture bottles   Culture   Final    NO GROWTH 2 DAYS Performed at [facility], [address] [place], [place] [address]    Report Status PENDING  Incomplete  Culture, blood (routine x 2)     Status: None (Preliminary result)   Collection Time: 02/04/21  4:30 PM  Specimen: BLOOD  Result Value Ref Range Status   Specimen Description BLOOD BLOOD  LEFT FOREARM  Final   Special Requests   Final    BOTTLES DRAWN AEROBIC AND ANAEROBIC Blood Culture adequate volume   Culture   Final    NO GROWTH 2 DAYS Performed at [facility], [address], [place], [place] [address]    Report Status PENDING  Incomplete  Resp Panel by RT-PCR (Flu Arnie Clingenpeel&B, Covid) Nasopharyngeal Swab     Status: None   Collection Time: 02/04/21  4:30 PM   Specimen: Nasopharyngeal Swab; Nasopharyngeal(NP) swabs in vial transport medium  Result Value Ref Range Status   SARS Coronavirus 2 by RT PCR NEGATIVE NEGATIVE Final    Comment: (NOTE) SARS-CoV-2 target nucleic acids are NOT DETECTED.  The SARS-CoV-2 RNA is generally detectable in upper respiratory specimens during the acute phase of infection. The lowest concentration of SARS-CoV-2 viral copies this assay can detect is 138 copies/mL. Ivy Puryear negative result does not preclude SARS-Cov-2 infection and should not be used as the sole basis for treatment or other patient management decisions. Melissia Lahman negative result may occur with  improper specimen collection/handling, submission of specimen other than nasopharyngeal swab, presence of viral mutation(s) within the areas targeted by this assay, and inadequate number of viral copies(<138 copies/mL). Aureliano Oshields negative result must be combined with clinical observations, patient history, and epidemiological information. The expected result is Negative.  Fact Sheet for Patients:  EntrepreneurPulse.com.au  Fact Sheet for Healthcare Providers:  IncredibleEmployment.be  This test is no t yet approved or cleared by the [place] FDA and  has been authorized for detection and/or diagnosis of SARS-CoV-2 by FDA under an Emergency Use Authorization (EUA). This EUA will remain  in effect (meaning this test can be used) for the duration of the COVID-19 declaration under Section 564(b)(1) of the Act, 21 U.S.C.section 360bbb-3(b)(1), unless the authorization  is terminated  or revoked sooner.       Influenza Roselyne Stalnaker by PCR NEGATIVE NEGATIVE Final   Influenza B by PCR NEGATIVE NEGATIVE Final    Comment: (NOTE) The Xpert Xpress SARS-CoV-2/FLU/RSV plus assay is intended as an aid in the diagnosis of influenza from Nasopharyngeal swab specimens and should not be used as Bailey Kolbe sole basis for treatment. Nasal washings and aspirates are unacceptable for Xpert Xpress SARS-CoV-2/FLU/RSV testing.  Fact Sheet for Patients: EntrepreneurPulse.com.au  Fact Sheet for Healthcare Providers: IncredibleEmployment.be  This test is not yet approved or cleared by the [place] FDA and has been authorized for detection and/or diagnosis of SARS-CoV-2 by FDA under an Emergency Use Authorization (EUA). This EUA will remain in effect (meaning this test can be used) for the duration of the COVID-19 declaration under Section 564(b)(1) of the Act, 21 U.S.C. section 360bbb-3(b)(1), unless the authorization is terminated or revoked.  Performed at [facility], [address], [place], [place] [address]   CULTURE, BLOOD (ROUTINE X 2) w Reflex to ID Panel     Status: None (Preliminary result)   Collection Time: 02/04/21 11:40 PM   Specimen: BLOOD  Result Value Ref Range Status   Specimen Description BLOOD LEFT ASSIST CONTROL  Final   Special Requests   Final    BOTTLES DRAWN AEROBIC AND ANAEROBIC Blood Culture adequate volume   Culture   Final    NO GROWTH < 12 HOURS Performed at [facility], [address] [place], [place] [address]    Report Status PENDING  Incomplete  CULTURE, BLOOD (ROUTINE X 2) w  Reflex to ID Panel     Status: None (Preliminary result)   Collection Time: 02/04/21 11:40 PM   Specimen: BLOOD  Result Value Ref Range Status   Specimen Description BLOOD LEFT HAND  Final   Special Requests   Final    BOTTLES DRAWN AEROBIC AND ANAEROBIC Blood Culture adequate volume   Culture   Final    NO GROWTH < 12  HOURS Performed at [facility], [address] [place], [place] [address]    Report Status PENDING  Incomplete  MRSA Next Gen by PCR, Nasal     Status: None   Collection Time: 02/05/21  1:24 AM   Specimen: Nasal Mucosa; Nasal Swab  Result Value Ref Range Status   MRSA by PCR Next Gen NOT DETECTED NOT DETECTED Final    Comment: (NOTE) The GeneXpert MRSA Assay (FDA approved for NASAL specimens only), is one component of Jeffree [place]au comprehensive MRSA colonization surveillance program. It is not intended to diagnose MRSA infection nor to guide or monitor treatment for MRSA infections. Test performance is not FDA approved in patients less than 20 years old. Performed at [facility], [address], [place], [place] [address]          Radiology Studies: PERIPHERAL VASCULAR CATHETERIZATION  Result Date: 02/06/2021 See surgical note for result.  PERIPHERAL VASCULAR CATHETERIZATION  Result Date: 02/05/2021 See surgical note for result.       Scheduled Meds:  amiodarone  200 mg Oral Daily   artificial tears   Left Eye QID   atorvastatin  80 mg Oral QHS   calcium acetate  2,001 mg Oral TID WC   carvedilol  25 mg Oral BID WC   fentaNYL       folic acid  1 mg Oral Daily   heparin injection (subcutaneous)  5,000 Units Subcutaneous Q8H   methocarbamol  500 mg Oral Once   metroNIDAZOLE  500 mg Oral Q8H   midazolam       [START ON 02/07/2021] vitamin B-12  1,000 mcg Oral Daily   Continuous Infusions:  sodium chloride     sodium chloride     ceFEPime (MAXIPIME) IV 1 g (02/06/21 1834)   vancomycin 1,000 mg (02/05/21 1757)     LOS: 2 days    Time spent: over 30 min    Fayrene Helper, MD [facility]   To contact the attending provider between 7A-7P or the covering provider during after hours 7P-7A, please log into the web site [email or web address] and access using universal [place] password for that web site. If you do not have the password, please  call the hospital operator.  02/06/2021, 6:58 PM

## 2021-02-06 NOTE — TOC Initial Note (Signed)
Transition of Care (TOC) - Initial/Assessment Note    Patient Details  Name: [name]. MRN: [id number] Date of Birth: 1971/01/07  Transition of Care [facility]) CM/SW Contact:    Pete Pelt, RN Phone Number: 02/06/2021, 10:45 AM  Clinical Narrative:      Patient lives at home with wife, who is at bedside now.  Spouse states that patient has had a decline in his mobility level and is concerned about patient returning home due to this.  Patient and wife have no other concerns for discharge at this time.  Care team aware, requested PT consult.  Patient transports to appointments with family, as well as to pharmacy.  No concerns about taking medicaitons as directed.    Awaiting PT eval, also patient scheduled to have procedure today for dialysis cath.  Message left with Elvera Bicker from dialysis.             Expected Discharge Plan:  (TBD) Barriers to Discharge: Continued Medical Work up   Patient Goals and CMS Choice Patient states their goals for this hospitalization and ongoing recovery are:: to get stronger and go home      Expected Discharge Plan and Services Expected Discharge Plan:  (TBD)   Discharge Planning Services: CM Consult   Living arrangements for the past 2 months: [place] Agency:  (TBD)        Prior Living Arrangements/Services Living arrangements for the past 2 months: Single Family Home Lives with:: Self, Parents Patient language and need for interpreter reviewed:: Yes (no interpreter required) Do you feel safe going back to the place where you live?: Yes      Need for Family Participation in Patient Care: Yes (Comment) Care giver support system in place?: Yes (comment) Current home services: DME ([facility]) Criminal Activity/Legal Involvement Pertinent to Current Situation/Hospitalization: No - Comment as needed  Activities of Daily Living Home Assistive Devices/Equipment: None ADL  Screening (condition at time of admission) Patient's cognitive ability adequate to safely complete daily activities?: Yes Is the patient deaf or have difficulty hearing?: No Does the patient have difficulty seeing, even when wearing glasses/contacts?: No Does the patient have difficulty concentrating, remembering, or making decisions?: No Patient able to express need for assistance with ADLs?: Yes Does the patient have difficulty dressing or bathing?: No Independently performs ADLs?: Yes (appropriate for developmental age) Does the patient have difficulty walking or climbing stairs?: Yes Weakness of Legs: Both Weakness of Arms/Hands: None  Permission Sought/Granted Permission sought to share information with : Case Manager       Permission granted to share info w AGENCY: Potential for agencies to assist with care        Emotional Assessment Appearance:: Appears stated age Attitude/Demeanor/Rapport: Gracious, Engaged Affect (typically observed): Pleasant, Appropriate Orientation: : Oriented to Self, Oriented to Place, Oriented to  Time, Oriented to Situation Alcohol / Substance Use: Not Applicable Psych Involvement: No (comment)  Admission diagnosis:  Osteomyelitis ([place]) [M86.9] Wound infection [T14.8XXA, L08.9] Patient Active Problem List   Diagnosis Date Noted   Hyperkalemia 02/05/2021   Hyperphosphatemia 02/05/2021   Hypermagnesemia 02/05/2021   Coronary artery disease 02/05/2021   Hypernatremia 02/05/2021   Osteomyelitis ([place]) 02/05/2021   Wound infection 02/04/2021   Infected wound 01/29/2021   Wound dehiscence, surgical, initial encounter 01/29/2021   ESRD  on hemodialysis ([place])    Prolonged Q-T interval on ECG    Anemia due to chronic kidney disease, on chronic dialysis [facility])    Atrial fibrillation with RVR ([place]) 12/08/2020   Finger amputation, 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 16/11/9602   Atrial fibrillation with rapid ventricular response (HCC)    Gangrene of finger of right hand  ([place]) 11/25/2020   Postural dizziness with presyncope 11/24/2020   Elevated troponin 11/24/2020   Left leg cellulitis 11/24/2020   Frequent falls 11/24/2020   Osteomyelitis of finger of right hand ([place]) 11/24/2020   AF (paroxysmal atrial fibrillation) ([place]) 11/24/2020   Hypotension 11/24/2020   NSTEMI (non-ST elevated myocardial infarction) ([place]) 11/24/2020   Abnormal gait 08/19/2020   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[place]) 08/19/2020   Anxiety state 08/19/2020   Callosity 08/19/2020   ED (erectile dysfunction) of organic origin 08/19/2020   Heart failure ([name]) 08/19/2020   Long term (current) use of insulin ([place]) 08/19/2020   Myalgia 08/19/2020   Obstructive uropathy 08/19/2020   Reactive depression 08/19/2020   History of ST elevation myocardial infarction (STEMI) 02/2020 s/p DES to LAD 08/19/2020   ESRD (end stage renal disease) ([place]) 08/19/2020   Blindness of left eye 08/15/2020   Combined forms of age-related cataract of right eye 04/17/2020   Supraventricular tachycardia ([place]) 02/26/2020   Acute anterior wall MI ([place])    Benign prostatic hyperplasia with lower urinary tract symptoms 11/07/2019   Fluid overload, unspecified 08/15/2019   Hypertension secondary to other renal disorders 07/20/2019   NVG (neovascular glaucoma), left, 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   Symptomatic anemia 06/05/2019   Aftercare including intermittent dialysis ([place]) 05/15/2019   Moderate protein-calorie malnutrition ([place]) 05/14/2019   Anaphylactic reaction due to adverse effect of correct drug or medicament properly administered, initial encounter 05/08/2019   Chronic anemia 05/07/2019   Chest pain, unspecified 05/07/2019   Coagulation defect, unspecified ([place]) 05/07/2019   Disorder of phosphorus metabolism, unspecified 05/07/2019   Encounter for fitting and adjustment of extracorporeal dialysis catheter ([place]) 05/07/2019   Gastroparesis 05/07/2019   Iron deficiency anemia, unspecified 05/07/2019    Major depressive disorder, recurrent, unspecified ([place]) 05/07/2019   Pain, unspecified 05/07/2019   Pruritus, unspecified 05/07/2019   Dizziness 54/10/8117   Metabolic acidosis 14/78/2956   Syncope 04/06/2019   Urinary retention 04/06/2019   Hypertensive chronic kidney disease with stage 1 through stage 4 chronic kidney disease, or unspecified chronic kidney disease 11/28/2018   Edema 11/28/2018   Hypokalemia 11/28/2018   Hypo-osmolality and hyponatremia 11/28/2018   Proteinuria 11/28/2018   Secondary hyperparathyroidism of renal origin ([place]) 11/28/2018   Lymphedema 01/09/2018   Chronic diastolic heart failure ([place]) 12/13/2017   Vomiting 11/28/2017   Acute on chronic heart failure ([place]) 11/23/2017   Diabetes mellitus, type II ([place]) 11/23/2017   Diabetic retinopathy ([place]) 07/05/2017   Uncontrolled type 2 diabetes mellitus with hyperglycemia ([place]) 07/08/2016   Uncontrolled type 2 diabetes mellitus with hyperglycemia, with long-term current use of insulin ([place]) 04/20/2016   Osteomyelitis of toe of left foot ([place]) 03/15/2016   Acute renal failure with tubular necrosis ([place]) 06/23/2015   Diarrhea 06/23/2015   Mixed hyperlipidemia    Essential hypertension, benign    Heart palpitations 05/06/2015   Sepsis ([place]) 03/07/2015   PCP:  [name], MD Pharmacy:   [place], [place] - [address] [place] [address] Phone: [phone] Fax: [phone]     Social Determinants of Health (SDOH) Interventions    Readmission  Risk Interventions Readmission Risk Prevention Plan 11/27/2020 02/28/2020  Transportation Screening Complete Complete  PCP or Specialist Appt within 3-5 Days Complete Complete  HRI or Home Care Consult Complete Complete  Social Work Consult for [place] Planning/Counseling Complete Complete  Palliative Care Screening Not Applicable Not Applicable  Medication Review Press photographer) Complete Complete   Some recent data might be hidden

## 2021-02-06 NOTE — Op Note (Signed)
OPERATIVE NOTE    PRE-OPERATIVE DIAGNOSIS: 1. ESRD 2. Steal syndrome right arm  POST-OPERATIVE DIAGNOSIS: same as above  PROCEDURE: Ultrasound guidance for vascular access to the left internal jugular vein Fluoroscopic guidance for placement of catheter Placement of a 23 cm tip to cuff tunneled hemodialysis catheter via the left internal jugular vein  SURGEON: [name], MD  ANESTHESIA:  Local with Moderate conscious sedation for approximately 40 minutes using 3 mg of Versed and 75 mcg of Fentanyl (for both procedures)  ESTIMATED BLOOD LOSS: 25 cc  FLUORO TIME: less than one minute  CONTRAST: none  FINDING(S): 1.  Patent left internal jugular vein  SPECIMEN(S):  None  INDICATIONS:   [name]. is a 50 y.o. male who presents with renal failure.  The patient needs long term dialysis access for their ESRD, and a Permcath is necessary.  Risks and benefits are discussed and informed consent is obtained.    DESCRIPTION: After obtaining full informed written consent, the patient was brought back to the vascular suited. The patient's left neck and chest were sterilely prepped and draped in a sterile surgical field was created. Moderate conscious sedation was administered during a face to face encounter with the patient throughout the procedure with my supervision of the RN administering medicines and monitoring the patient's vital signs, pulse oximetry, telemetry and mental status throughout from the start of the procedure until the patient was taken to the recovery room.  The left internal jugular vein was visualized with ultrasound and found to be patent. It was then accessed under direct ultrasound guidance and a permanent image was recorded. A wire was placed. After skin nick and dilatation, the peel-away sheath was placed over the wire. I then turned my attention to an area under the clavicle. Approximately 1-2 fingerbreadths below the clavicle a small counterincision was  created and tunneled from the subclavicular incision to the access site. Using fluoroscopic guidance, a 23 centimeter tip to cuff tunneled hemodialysis catheter was selected, and tunneled from the subclavicular incision to the access site. It was then placed through the peel-away sheath and the peel-away sheath was removed. Using fluoroscopic guidance the catheter tips were parked in the right atrium. The appropriate distal connectors were placed. It withdrew blood well and flushed easily with heparinized saline and a concentrated heparin solution was then placed. It was secured to the chest wall with 2 Prolene sutures. The access incision was closed single 4-0 Monocryl. A 4-0 Monocryl pursestring suture was placed around the exit site. Sterile dressings were placed. The patient tolerated the procedure well and was taken to the recovery room in stable condition.  COMPLICATIONS: None  CONDITION: Stable  [name]  02/06/2021, 3:24 PM   This note was created with Dragon Medical transcription system. Any errors in dictation are purely unintentional.

## 2021-02-06 NOTE — Plan of Care (Signed)
?  Problem: Education: ?Goal: Knowledge of General Education information will improve ?Description: Including pain rating scale, medication(s)/side effects and non-pharmacologic comfort measures ?Outcome: Progressing ?  ?Problem: Clinical Measurements: ?Goal: Respiratory complications will improve ?Outcome: Progressing ?  ?Problem: Clinical Measurements: ?Goal: Cardiovascular complication will be avoided ?Outcome: Progressing ?  ?Problem: Pain Managment: ?Goal: General experience of comfort will improve ?Outcome: Progressing ?  ?Problem: Safety: ?Goal: Ability to remain free from injury will improve ?Outcome: Progressing ?  ?

## 2021-02-07 LAB — COMPREHENSIVE METABOLIC PANEL
ALT: 6 U/L (ref 0–44)
AST: 10 U/L — ABNORMAL LOW (ref 15–41)
Albumin: 2.5 g/dL — ABNORMAL LOW (ref 3.5–5.0)
Alkaline Phosphatase: 52 U/L (ref 38–126)
Anion gap: 13 (ref 5–15)
BUN: 65 mg/dL — ABNORMAL HIGH (ref 6–20)
CO2: 21 mmol/L — ABNORMAL LOW (ref 22–32)
Calcium: 8.1 mg/dL — ABNORMAL LOW (ref 8.9–10.3)
Chloride: 106 mmol/L (ref 98–111)
Creatinine, Ser: 12.24 mg/dL — ABNORMAL HIGH (ref 0.61–1.24)
GFR, Estimated: 4 mL/min — ABNORMAL LOW (ref >60)
Glucose, Bld: 81 mg/dL (ref 70–99)
Potassium: 4.8 mmol/L (ref 3.5–5.1)
Sodium: 140 mmol/L (ref 135–145)
Total Bilirubin: 0.8 mg/dL (ref 0.3–1.2)
Total Protein: 6 g/dL — ABNORMAL LOW (ref 6.5–8.1)

## 2021-02-07 LAB — RETICULOCYTES
Immature Retic Fract: 8.8 % (ref 2.3–15.9)
RBC.: 2.87 MIL/uL — ABNORMAL LOW (ref 4.22–5.81)
Retic Count, Absolute: 26.1 10*3/uL (ref 19.0–186.0)
Retic Ct Pct: 0.9 % (ref 0.4–3.1)

## 2021-02-07 LAB — CBC WITH DIFFERENTIAL/PLATELET
Abs Immature Granulocytes: 0.08 10*3/uL — ABNORMAL HIGH (ref 0.00–0.07)
Basophils Absolute: 0.1 10*3/uL (ref 0.0–0.1)
Basophils Relative: 1 %
Eosinophils Absolute: 0.3 10*3/uL (ref 0.0–0.5)
Eosinophils Relative: 2 %
HCT: 21.8 % — ABNORMAL LOW (ref 39.0–52.0)
Hemoglobin: 6.8 g/dL — ABNORMAL LOW (ref 13.0–17.0)
Immature Granulocytes: 1 %
Lymphocytes Relative: 7 %
Lymphs Abs: 1 10*3/uL (ref 0.7–4.0)
MCH: 23.5 pg — ABNORMAL LOW (ref 26.0–34.0)
MCHC: 31.2 g/dL (ref 30.0–36.0)
MCV: 75.4 fL — ABNORMAL LOW (ref 80.0–100.0)
Monocytes Absolute: 1.7 10*3/uL — ABNORMAL HIGH (ref 0.1–1.0)
Monocytes Relative: 13 %
Neutro Abs: 10 10*3/uL — ABNORMAL HIGH (ref 1.7–7.7)
Neutrophils Relative %: 76 %
Platelets: 186 10*3/uL (ref 150–400)
RBC: 2.89 MIL/uL — ABNORMAL LOW (ref 4.22–5.81)
RDW: 18.3 % — ABNORMAL HIGH (ref 11.5–15.5)
WBC: 13.1 10*3/uL — ABNORMAL HIGH (ref 4.0–10.5)
nRBC: 0 % (ref 0.0–0.2)

## 2021-02-07 LAB — HEMOGLOBIN AND HEMATOCRIT, BLOOD
HCT: 23.6 % — ABNORMAL LOW (ref 39.0–52.0)
Hemoglobin: 7.6 g/dL — ABNORMAL LOW (ref 13.0–17.0)

## 2021-02-07 LAB — MAGNESIUM: Magnesium: 1.9 mg/dL (ref 1.7–2.4)

## 2021-02-07 LAB — PHOSPHORUS: Phosphorus: 8.8 mg/dL — ABNORMAL HIGH (ref 2.5–4.6)

## 2021-02-07 LAB — PREPARE RBC (CROSSMATCH)

## 2021-02-07 MED ORDER — SODIUM CHLORIDE 0.9% IV SOLUTION: Freq: Once | INTRAVENOUS | Status: AC

## 2021-02-07 NOTE — Progress Notes (Signed)
[facility]  ROUNDING NOTE   Subjective:   [name]. is a 50 year old male with a past medical history significant for diabetes, hypertension, diastolic CHF, CAD with STEMI January 2022, and end stage renal disease on dialysis. Patient presents to emergency room from [facility] for orthopedic evaluation. Patient has been admitted for Osteomyelitis [facility]) [M86.9] Wound infection [T14.8XXA, L08.9]   Patient is known to our clinic and receives outpatient treatment from [facility] on a TTS schedule. He is supervised by Dr Juleen China.   Patient seen eating breakfast States he is tired today, does not want scheduled dialysis treatment Denies shortness of breath   Objective:  Vital signs in last 24 hours:  Temp:  [98.2 F (36.8 C)-100.3 F (37.9 C)] 99.3 F (37.4 C) (12/17 0912) Pulse Rate:  [61-75] 61 (12/17 0912) Resp:  [15-20] 18 (12/17 0912) BP: (126-173)/(62-81) 142/71 (12/17 0912) SpO2:  [94 %-100 %] 100 % (12/17 0912)  Weight change:  Filed Weights   02/05/21 1252  Weight: 93.1 kg     Intake/Output: I/O last 3 completed shifts: In: 952 [P.O.:480; Blood:272; IV Piggyback:200] Out: -    Intake/Output this shift:  No intake/output data recorded.  Physical Exam: General: NAD, resting comfortably  Head: Normocephalic, atraumatic. Moist oral mucosal membranes  Eyes: Anicteric  Lungs:  Clear to auscultation, normal effort  Heart: Regular rate and rhythm  Abdomen:  Soft, nontender  Extremities:  trace peripheral edema.  Neurologic: Nonfocal, moving all four extremities  Skin:  Right 4th finger wound dressing  Access: LUE AVF(ligation 02/06/21) Rt Permcath placed on 78/24/23    Basic Metabolic Panel: Recent Labs  Lab 02/04/21 1628 02/04/21 2340 02/05/21 0143 02/06/21 0433 02/07/21 0332  NA 149*  --  146* 141 140  K 5.3*  --  5.8* 4.1 4.8  CL 121*  --  120* 108 106  CO2 10*  --  10* 22 21*  GLUCOSE 70  --  85 97 81  BUN 79*  --   82* 49* 65*  CREATININE 17.99*  --  17.53* 10.72* 12.24*  CALCIUM 8.5*  --  8.3* 8.1* 8.1*  MG  --  2.5*  --  1.8 1.9  PHOS  --  11.5*  --  6.2* 8.8*     Liver Function Tests: Recent Labs  Lab 02/04/21 1628 02/06/21 0433 02/07/21 0332  AST 11* 11* 10*  ALT 14 11 6   ALKPHOS 64 57 52  BILITOT 0.2* 0.8 0.8  PROT 7.0 5.9* 6.0*  ALBUMIN 3.0* 2.5* 2.5*    No results for input(s): LIPASE, AMYLASE in the last 168 hours. No results for input(s): AMMONIA in the last 168 hours.  CBC: Recent Labs  Lab 02/04/21 1628 02/05/21 0143 02/06/21 0433 02/07/21 0332  WBC 10.8* 12.6* 11.9* 13.1*  NEUTROABS 7.5  --  8.8* 10.0*  HGB 7.4* 7.2* 6.5* 6.8*  HCT 25.5* 23.5* 20.6* 21.8*  MCV 81.0 78.3* 75.2* 75.4*  PLT 270 257 216 186     Cardiac Enzymes: No results for input(s): CKTOTAL, CKMB, CKMBINDEX, TROPONINI in the last 168 hours.  BNP: Invalid input(s): POCBNP  CBG: No results for input(s): GLUCAP in the last 168 hours.   Microbiology: Results for orders placed or performed during the hospital encounter of 02/04/21  CULTURE, BLOOD (ROUTINE X 2) w Reflex to ID Panel     Status: None (Preliminary result)   Collection Time: 02/04/21 11:40 PM   Specimen: BLOOD  Result Value Ref Range Status  Specimen Description BLOOD LEFT ASSIST CONTROL  Final   Special Requests   Final    BOTTLES DRAWN AEROBIC AND ANAEROBIC Blood Culture adequate volume   Culture   Final    NO GROWTH 2 DAYS Performed at [facility], [address] [place], [place] [address]    Report Status PENDING  Incomplete  CULTURE, BLOOD (ROUTINE X 2) w Reflex to ID Panel     Status: None (Preliminary result)   Collection Time: 02/04/21 11:40 PM   Specimen: BLOOD  Result Value Ref Range Status   Specimen Description BLOOD LEFT HAND  Final   Special Requests   Final    BOTTLES DRAWN AEROBIC AND ANAEROBIC Blood Culture adequate volume   Culture   Final    NO GROWTH 2 DAYS Performed at [facility], [address] [place], [place] [address]    Report Status PENDING  Incomplete  MRSA Next Gen by PCR, Nasal     Status: None   Collection Time: 02/05/21  1:24 AM   Specimen: Nasal Mucosa; Nasal Swab  Result Value Ref Range Status   MRSA by PCR Next Gen NOT DETECTED NOT DETECTED Final    Comment: (NOTE) The GeneXpert MRSA Assay (FDA approved for NASAL specimens only), is one component of a comprehensive MRSA colonization surveillance program. It is not intended to diagnose MRSA infection nor to guide or monitor treatment for MRSA infections. Test performance is not FDA approved in patients less than 39 years old. Performed at [facility], [address] [place], [place] [address]     Coagulation Studies: No results for input(s): LABPROT, INR in the last 72 hours.  Urinalysis: No results for input(s): COLORURINE, LABSPEC, PHURINE, GLUCOSEU, HGBUR, BILIRUBINUR, KETONESUR, PROTEINUR, UROBILINOGEN, NITRITE, LEUKOCYTESUR in the last 72 hours.  Invalid input(s): APPERANCEUR    Imaging: PERIPHERAL VASCULAR CATHETERIZATION  Result Date: 02/06/2021 See surgical note for result.    Medications:    sodium chloride     sodium chloride     ceFEPime (MAXIPIME) IV Stopped (02/06/21 1904)   vancomycin 1,000 mg (02/05/21 1757)    amiodarone  200 mg Oral Daily   artificial tears   Left Eye QID   atorvastatin  80 mg Oral QHS   calcium acetate  2,001 mg Oral TID WC   carvedilol  25 mg Oral BID WC   folic acid  1 mg Oral Daily   heparin injection (subcutaneous)  5,000 Units Subcutaneous Q8H   methocarbamol  500 mg Oral Once   metroNIDAZOLE  500 mg Oral Q8H   vitamin B-12  1,000 mcg Oral Daily   sodium chloride, sodium chloride, acetaminophen **OR** acetaminophen, alteplase, heparin, labetalol, lidocaine (PF), lidocaine-prilocaine, morphine injection, nitroGLYCERIN, ondansetron (ZOFRAN) IV, pentafluoroprop-tetrafluoroeth  Assessment/ Plan:  [name].  is a 50 y.o.  male with a past medical history significant for diabetes, hypertension, diastolic CHF, CAD with STEMI January 2022, and end stage renal disease on dialysis. Patiet presents to emergency room for further evaluation of right, fourth finger treatment. Patient has been admitted for Osteomyelitis [facility]) [M86.9] Wound infection [T14.8XXA, L08.9]  [facility] TTS [facility]  Left AVF 93kg  End stage renal disease on dialysis. Scheduled to received dialysis today, currently stating he will not come due to fatigue. Discussed with patient that we would perform treatment with no UF. He has agreed to dialysis today with no UF. Next treatment scheduled for Tuesday.   2. Anemia of chronic kidney disease.  Lab  Results  Component Value Date   HGB 6.8 (L) 02/07/2021  EPO 10000 units IV with treatments. Received 1 unit RBCs yesterday with minimal recovery. Receiving additional unit this am.  3. Secondary Hyperparathyroidism:  Lab Results  Component Value Date   CALCIUM 8.1 (L) 02/07/2021   CAION 0.94 (L) 01/08/2021   PHOS 8.8 (H) 02/07/2021   Phosphorus elevated.  Continue calcium acetate with meals  4. Hypertension with chronic kidney disease. Home regimen includes losartan, carvedilol and amiodarone. Losartan currently held.  Currently receiving carvedilol only.  5. Osteomyelitis of Rt fourth digit. Currently on antibiotic therapy with Cefepime and Vancomycin. Underwent right upper extremity angiogram on 02/05/2021 showing steal with majority blood flow going to fistula and little going to hand.   -Appreciate vascular performing ligation yesterday of permacath placement.    LOS: 3   12/17/20229:19 AM

## 2021-02-07 NOTE — Progress Notes (Signed)
PT Cancellation Note  Patient Details Name: [name]. MRN: [id number] DOB: 1970-10-11   Cancelled Treatment:    Reason Eval/Treat Not Completed: Patient not medically ready PT orders received, chart reviewed. Pt noted to have low Hgb (6.8) & spoke with MD who recommends to hold until after transfusion. Will re-attempt as able.  Lavone Nian, PT, DPT 02/07/21, 8:04 AM    Waunita Schooner 02/07/2021, 8:03 AM

## 2021-02-07 NOTE — Progress Notes (Addendum)
PROGRESS NOTE    [name].  NGE:[id number] DOB: [birth year] DOA: 02/04/2021 PCP: [name], MD  No chief complaint on file.   Brief Narrative:  50 yo with hx ESRD (T/Th), HTN, T2DM, HTN, HLD, gastroparesis, CAD who presents to the ED with right 4th digit osteomyelitis.  He's s/p previous partial amputation of R 4th finger, but is being admitted now for further evaluation by vascular and orthopedics inpatient.  Hospitalization from 01/29/2021 to 01/30/2021: Per discharge summary, patient presented for right fourth digit wound dehiscence at previous amputation site.  Patient did not have fever, swelling, bleeding.  Vascular was consulted and recommended further amputation to aid with wound healing as the wound is necrotic and will need debridement.  Surgery was scheduled for days out so patient is to be discharged home and will be set up to complete procedure as an outpatient on 02/03/2021.  He was discharged home with Augmentin and doxycycline until after surgery.    Assessment & Plan:   Principal Problem:   Wound infection Active Problems:   Hypertensive chronic kidney disease with stage 1 through stage 4 chronic kidney disease, or unspecified chronic kidney disease   Major depressive disorder, recurrent, unspecified (HCC)   Moderate protein-calorie malnutrition (HCC)   Secondary hyperparathyroidism of renal origin ([place])   Reactive depression   Gangrene of finger of right hand ([place])   Wound dehiscence, surgical, initial encounter   Osteomyelitis ([place])   Osteomyelitis of finger of right hand ([place])   ESRD on hemodialysis ([place])   Steal syndrome as complication of dialysis access ([place])   Anemia   Hyperphosphatemia   Hypernatremia   Folate deficiency   Metabolic acidosis   Hyperkalemia   Hypermagnesemia   Mixed hyperlipidemia   Essential hypertension, benign   Coronary artery disease   Blindness of left eye   AF (paroxysmal atrial fibrillation) (HCC)    Gastroparesis   Iron deficiency anemia, unspecified   Osteomyelitis of finger of right hand (HCC) Sed rate 97 CRP 3.6 Has had temp to 100-100.3 on multiple occasions in past 24 hrs Negative MRSA PCR Vancomycin/cefepime, add flagyl Blood cultures on admission NG 12/8 cx with citrobacter youngae and proteus mirabilis and mixed anaerobic flora Plain films 12/14 with new destructive changes involving base of ring finger proximal phalanx compatible with acute osteo S/p right upper extremity angiogram with angioplasty of distal right ulnar artery and proximal right radial artery S/p coil embolization of the right brachiocephalic AV fistula as well as large cephalic vein branch and placement of tunneled HD cath Appreciate vascular and orthopedic recommendations.  Hand follow up will occur outpatient given they don't round or operate here. Consider ID involvement based on surgery  Steal syndrome as complication of dialysis access [facility]) S/p coil embolization of right brachiocephalic AV fistula  ESRD on hemodialysis [facility]) Missed Hx Tuesday Per renal - consider L AV fistula vs PD  Folate deficiency replace  Hypernatremia improved  Hyperphosphatemia Per renal with esrd, need for dialysis  Anemia S/p 1 unit pRBC, inappropriate bump Transfuse again and follow retics hypoprolif Labs with AOCD and folate def, low normal B12 - supplement B12 and follow MMA  Hypermagnesemia Electrolyte abnormalities per renal  Hyperkalemia Improved with dialysis  Metabolic acidosis Resolved with HD  Mixed hyperlipidemia statin  Coronary artery disease Statin, coreg  Essential hypertension, benign coreg  AF (paroxysmal atrial fibrillation) ([place]) Continue amiodarone, coreg Discharged on eliquis on 11/18, but looks like anticoagulation has fallen off (not on 12/9 d/c summary)? Plavix  and eliquis were being held in preparation for surgery.  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 C/o discomfort, will discuss with ophtho -> recommended nonmedicated ointment, abx (which he's already on) and outpatient oculoplastics follow up  Iron deficiency anemia, unspecified Anemia related to esrd, will repeat anemia labs (AOCD, folate def, low normal b12)  Gastroparesis noted   DVT prophylaxis: heparin Code Status: full Family Communication: wife at bedside Disposition:   Status is: Observation  The patient will require care spanning > 2 midnights and should be moved to inpatient because: need for vascular and orthopedic eval and management      Consultants:  Vascular ortho  Procedures:  12/15 PROCEDURE PERFORMED: 1. Ultrasound guidance vascular access to right femoral artery. 2. Catheter placement to right radial artery and right ulnar arteries     from right femoral approach. 3. Thoracic aortogram and selective right upper extremity angiogram     including selective images of the radial and ulnar arteries. 4.  Percutaneous transluminal of distal right ulnar artery with 2.5 mm diameter angioplasty balloon 5.  Percutaneous transluminal angioplasty of the proximal right radial artery with 3 mm diameter angioplasty balloon 5. StarClose closure device right femoral artery.  PROCEDURE: 1.   Right brachiocephalic arteriovenous fistula cannulation under ultrasound guidance 2.   Right arm fistulagram including central venogram 3.   Coil embolization of the right brachiocephalic AV fistula as well as Makyna Niehoff large cephalic vein branch  PROCEDURE: Ultrasound guidance for vascular access to the left internal jugular vein Fluoroscopic guidance for placement of catheter Placement of Lea Walbert 23 cm tip to cuff tunneled hemodialysis catheter via the left internal jugular vein Antimicrobials:  Anti-infectives (From admission, onward)    Start     Dose/Rate Route Frequency Ordered Stop   02/07/21 0000  ceFAZolin (ANCEF) IVPB 1 g/50 mL premix       Note to  Pharmacy: To be given in specials   1 g 100 mL/hr over 30 Minutes Intravenous  Once 02/06/21 1339 02/06/21 1446   02/05/21 1800  ceFEPIme (MAXIPIME) 1 g in sodium chloride 0.9 % 100 mL IVPB        1 g 200 mL/hr over 30 Minutes Intravenous Every 24 hours 02/05/21 1126     02/05/21 1530  metroNIDAZOLE (FLAGYL) tablet 500 mg        500 mg Oral Every 8 hours 02/05/21 1435     02/05/21 1400  vancomycin (VANCOCIN) 1,000 mg in sodium chloride 0.9 % 150 mL IVPB  Status:  Discontinued        1,000 mg 150 mL/hr over 60 Minutes Intravenous Every Dialysis 02/05/21 1126 02/05/21 1306   02/05/21 1400  vancomycin (VANCOCIN) IVPB 1000 mg/200 mL premix        1,000 mg 200 mL/hr over 60 Minutes Intravenous Every T-Th-Sa (Hemodialysis) 02/05/21 1306     02/05/21 0815  ceFAZolin (ANCEF) IVPB 2g/100 mL premix        2 g 200 mL/hr over 30 Minutes Intravenous On call to O.R. 02/05/21 0759 02/05/21 0931   02/05/21 0012  vancomycin variable dose per unstable renal function (pharmacist dosing)  Status:  Discontinued         Does not apply See admin instructions 02/05/21 0013 02/05/21 1126       Subjective: No new complaints  Objective: Vitals:   02/07/21 1305 02/07/21 1315 02/07/21 1330 02/07/21 1345  BP:  (!) 180/78 (!) 186/80 (!) 141/67  Pulse:  Resp:  11 12 (!) 21  Temp:      TempSrc:      SpO2:  98% 99% 100%  Weight: 93.6 kg       Intake/Output Summary (Last 24 hours) at 02/07/2021 1407 Last data filed at 02/07/2021 1146 Gross per 24 hour  Intake 898 ml  Output --  Net 898 ml   Filed Weights   02/05/21 1252 02/07/21 1305  Weight: 93.1 kg 93.6 kg    Examination:  General: No acute distress. Cardiovascular: RRR -> tunneled HD cath on L Lungs: unlabored Abdomen: Soft, nontender, nondistended  Neurological: Alert and oriented 3. Moves all extremities 4 . Cranial nerves II through XII grossly intact. Skin: Warm and dry. No rashes or lesions. Extremities: RUE with dressing to  hand, no thrill to fistula   Data Reviewed: I have personally reviewed following labs and imaging studies  CBC: Recent Labs  Lab 02/04/21 1628 02/05/21 0143 02/06/21 0433 02/07/21 0332  WBC 10.8* 12.6* 11.9* 13.1*  NEUTROABS 7.5  --  8.8* 10.0*  HGB 7.4* 7.2* 6.5* 6.8*  HCT 25.5* 23.5* 20.6* 21.8*  MCV 81.0 78.3* 75.2* 75.4*  PLT 270 257 216 106    Basic Metabolic Panel: Recent Labs  Lab 02/04/21 1628 02/04/21 2340 02/05/21 0143 02/06/21 0433 02/07/21 0332  NA 149*  --  146* 141 140  K 5.3*  --  5.8* 4.1 4.8  CL 121*  --  120* 108 106  CO2 10*  --  10* 22 21*  GLUCOSE 70  --  85 97 81  BUN 79*  --  82* 49* 65*  CREATININE 17.99*  --  17.53* 10.72* 12.24*  CALCIUM 8.5*  --  8.3* 8.1* 8.1*  MG  --  2.5*  --  1.8 1.9  PHOS  --  11.5*  --  6.2* 8.8*    GFR: Estimated Creatinine Clearance: 8.4 mL/min (Nivedita Mirabella) (by C-G formula based on SCr of 12.24 mg/dL (H)).  Liver Function Tests: Recent Labs  Lab 02/04/21 1628 02/06/21 0433 02/07/21 0332  AST 11* 11* 10*  ALT 14 11 6   ALKPHOS 64 57 52  BILITOT 0.2* 0.8 0.8  PROT 7.0 5.9* 6.0*  ALBUMIN 3.0* 2.5* 2.5*    CBG: No results for input(s): GLUCAP in the last 168 hours.    Recent Results (from the past 240 hour(s))  Body fluid culture w Gram Stain     Status: None   Collection Time: 01/29/21  1:06 AM   Specimen: Hand; Body Fluid  Result Value Ref Range Status   Specimen Description   Final    HAND LEFT Performed at [facility], [address] [place], [place] [address]    Special Requests   Final    NONE Performed at [facility], [place], [place] [address]    Gram Stain   Final    ABUNDANT WBC PRESENT, PREDOMINANTLY PMN MODERATE GRAM POSITIVE COCCI MODERATE GRAM NEGATIVE RODS RARE GRAM POSITIVE RODS Performed at [facility], [place] [address] [place], [place] [address]    Culture   Final    MODERATE CITROBACTER YOUNGAE FEW PROTEUS MIRABILIS MIXED ANAEROBIC  FLORA PRESENT.  CALL LAB IF FURTHER IID REQUIRED.    Report Status 01/31/2021 FINAL  Final   Organism ID, Bacteria CITROBACTER YOUNGAE  Final   Organism ID, Bacteria PROTEUS MIRABILIS  Final      Susceptibility   Citrobacter youngae - MIC*    CEFAZOLIN >=64  RESISTANT Resistant     CEFEPIME <=0.12 SENSITIVE Sensitive     CEFTAZIDIME <=1 SENSITIVE Sensitive     CEFTRIAXONE <=0.25 SENSITIVE Sensitive     CIPROFLOXACIN <=0.25 SENSITIVE Sensitive     GENTAMICIN <=1 SENSITIVE Sensitive     IMIPENEM <=0.25 SENSITIVE Sensitive     TRIMETH/SULFA <=20 SENSITIVE Sensitive     PIP/TAZO <=4 SENSITIVE Sensitive     * MODERATE CITROBACTER YOUNGAE   Proteus mirabilis - MIC*    AMPICILLIN <=2 SENSITIVE Sensitive     CEFAZOLIN <=4 SENSITIVE Sensitive     CEFEPIME <=0.12 SENSITIVE Sensitive     CEFTAZIDIME <=1 SENSITIVE Sensitive     CEFTRIAXONE <=0.25 SENSITIVE Sensitive     CIPROFLOXACIN <=0.25 SENSITIVE Sensitive     GENTAMICIN <=1 SENSITIVE Sensitive     IMIPENEM 2 SENSITIVE Sensitive     TRIMETH/SULFA <=20 SENSITIVE Sensitive     AMPICILLIN/SULBACTAM <=2 SENSITIVE Sensitive     PIP/TAZO <=4 SENSITIVE Sensitive     * FEW PROTEUS MIRABILIS  Resp Panel by RT-PCR (Flu Asberry Lascola&B, Covid) Hand, Left     Status: None   Collection Time: 01/29/21  1:06 AM   Specimen: Hand, Left; Nasopharyngeal(NP) swabs in vial transport medium  Result Value Ref Range Status   SARS Coronavirus 2 by RT PCR NEGATIVE NEGATIVE Final    Comment: (NOTE) SARS-CoV-2 target nucleic acids are NOT DETECTED.  The SARS-CoV-2 RNA is generally detectable in upper respiratory specimens during the acute phase of infection. The lowest concentration of SARS-CoV-2 viral copies this assay can detect is 138 copies/mL. Orlanda Lemmerman negative result does not preclude SARS-Cov-2 infection and should not be used as the sole basis for treatment or other patient management decisions. Jemell Town negative result may occur with  improper specimen collection/handling,  submission of specimen other than nasopharyngeal swab, presence of viral mutation(s) within the areas targeted by this assay, and inadequate number of viral copies(<138 copies/mL). Libni Fusaro negative result must be combined with clinical observations, patient history, and epidemiological information. The expected result is Negative.  Fact Sheet for Patients:  EntrepreneurPulse.com.au  Fact Sheet for Healthcare Providers:  IncredibleEmployment.be  This test is no t yet approved or cleared by the [place] FDA and  has been authorized for detection and/or diagnosis of SARS-CoV-2 by FDA under an Emergency Use Authorization (EUA). This EUA will remain  in effect (meaning this test can be used) for the duration of the COVID-19 declaration under Section 564(b)(1) of the Act, 21 U.S.C.section 360bbb-3(b)(1), unless the authorization is terminated  or revoked sooner.       Influenza Muna Demers by PCR NEGATIVE NEGATIVE Final   Influenza B by PCR NEGATIVE NEGATIVE Final    Comment: (NOTE) The Xpert Xpress SARS-CoV-2/FLU/RSV plus assay is intended as an aid in the diagnosis of influenza from Nasopharyngeal swab specimens and should not be used as Ocie Tino sole basis for treatment. Nasal washings and aspirates are unacceptable for Xpert Xpress SARS-CoV-2/FLU/RSV testing.  Fact Sheet for Patients: EntrepreneurPulse.com.au  Fact Sheet for Healthcare Providers: IncredibleEmployment.be  This test is not yet approved or cleared by the [place] FDA and has been authorized for detection and/or diagnosis of SARS-CoV-2 by FDA under an Emergency Use Authorization (EUA). This EUA will remain in effect (meaning this test can be used) for the duration of the COVID-19 declaration under Section 564(b)(1) of the Act, 21 U.S.C. section 360bbb-3(b)(1), unless the authorization is terminated or revoked.  Performed at [facility], [address], [place], [place] [address]  Culture, blood (routine x 2)     Status: None (Preliminary result)   Collection Time: 02/04/21  4:28 PM   Specimen: BLOOD  Result Value Ref Range Status   Specimen Description   Final    BLOOD BLOOD LEFT HAND Performed at [facility], [address] [place], [place] [address]    Special Requests   Final    BOTTLES DRAWN AEROBIC AND ANAEROBIC Blood Culture results may not be optimal due to an excessive volume of blood received in culture bottles Performed at [facility], [address] [place], [place] [address]    Culture   Final    NO GROWTH 3 DAYS Performed at [facility], [place] [address] [place], [place] [address]    Report Status PENDING  Incomplete  Culture, blood (routine x 2)     Status: None (Preliminary result)   Collection Time: 02/04/21  4:30 PM   Specimen: BLOOD  Result Value Ref Range Status   Specimen Description BLOOD BLOOD LEFT FOREARM  Final   Special Requests   Final    BOTTLES DRAWN AEROBIC AND ANAEROBIC Blood Culture adequate volume   Culture   Final    NO GROWTH 3 DAYS Performed at [facility], [address] [place], [place] [address]    Report Status PENDING  Incomplete  Resp Panel by RT-PCR (Flu Alylah Blakney&B, Covid) Nasopharyngeal Swab     Status: None   Collection Time: 02/04/21  4:30 PM   Specimen: Nasopharyngeal Swab; Nasopharyngeal(NP) swabs in vial transport medium  Result Value Ref Range Status   SARS Coronavirus 2 by RT PCR NEGATIVE NEGATIVE Final    Comment: (NOTE) SARS-CoV-2 target nucleic acids are NOT DETECTED.  The SARS-CoV-2 RNA is generally detectable in upper respiratory specimens during the acute phase of infection. The lowest concentration of SARS-CoV-2 viral copies this assay can detect is 138 copies/mL. Kanija Remmel negative result does not preclude SARS-Cov-2 infection and should not be used as the sole basis for treatment or other patient management decisions. Shaughnessy Gethers negative result may occur with   improper specimen collection/handling, submission of specimen other than nasopharyngeal swab, presence of viral mutation(s) within the areas targeted by this assay, and inadequate number of viral copies(<138 copies/mL). Destani Wamser negative result must be combined with clinical observations, patient history, and epidemiological information. The expected result is Negative.  Fact Sheet for Patients:  EntrepreneurPulse.com.au  Fact Sheet for Healthcare Providers:  IncredibleEmployment.be  This test is no t yet approved or cleared by the [place] FDA and  has been authorized for detection and/or diagnosis of SARS-CoV-2 by FDA under an Emergency Use Authorization (EUA). This EUA will remain  in effect (meaning this test can be used) for the duration of the COVID-19 declaration under Section 564(b)(1) of the Act, 21 U.S.C.section 360bbb-3(b)(1), unless the authorization is terminated  or revoked sooner.       Influenza Jazmina Muhlenkamp by PCR NEGATIVE NEGATIVE Final   Influenza B by PCR NEGATIVE NEGATIVE Final    Comment: (NOTE) The Xpert Xpress SARS-CoV-2/FLU/RSV plus assay is intended as an aid in the diagnosis of influenza from Nasopharyngeal swab specimens and should not be used as Neshia Mckenzie sole basis for treatment. Nasal washings and aspirates are unacceptable for Xpert Xpress SARS-CoV-2/FLU/RSV testing.  Fact Sheet for Patients: EntrepreneurPulse.com.au  Fact Sheet for Healthcare Providers: IncredibleEmployment.be  This test is not yet approved or cleared by the [place] FDA and has been authorized for detection and/or diagnosis of SARS-CoV-2 by FDA under an  Emergency Use Authorization (EUA). This EUA will remain in effect (meaning this test can be used) for the duration of the COVID-19 declaration under Section 564(b)(1) of the Act, 21 U.S.C. section 360bbb-3(b)(1), unless the authorization is terminated  or revoked.  Performed at [facility], [address], [place], [place] [address]   CULTURE, BLOOD (ROUTINE X 2) w Reflex to ID Panel     Status: None (Preliminary result)   Collection Time: 02/04/21 11:40 PM   Specimen: BLOOD  Result Value Ref Range Status   Specimen Description BLOOD LEFT ASSIST CONTROL  Final   Special Requests   Final    BOTTLES DRAWN AEROBIC AND ANAEROBIC Blood Culture adequate volume   Culture   Final    NO GROWTH 2 DAYS Performed at [facility], [address], [place], [place] [address]    Report Status PENDING  Incomplete  CULTURE, BLOOD (ROUTINE X 2) w Reflex to ID Panel     Status: None (Preliminary result)   Collection Time: 02/04/21 11:40 PM   Specimen: BLOOD  Result Value Ref Range Status   Specimen Description BLOOD LEFT HAND  Final   Special Requests   Final    BOTTLES DRAWN AEROBIC AND ANAEROBIC Blood Culture adequate volume   Culture   Final    NO GROWTH 2 DAYS Performed at [facility], [address] [place], [place] [address]    Report Status PENDING  Incomplete  MRSA Next Gen by PCR, Nasal     Status: None   Collection Time: 02/05/21  1:24 AM   Specimen: Nasal Mucosa; Nasal Swab  Result Value Ref Range Status   MRSA by PCR Next Gen NOT DETECTED NOT DETECTED Final    Comment: (NOTE) The GeneXpert MRSA Assay (FDA approved for NASAL specimens only), is one component of Lasean Gorniak comprehensive MRSA colonization surveillance program. It is not intended to diagnose MRSA infection nor to guide or monitor treatment for MRSA infections. Test performance is not FDA approved in patients less than 38 years old. Performed at [facility], [address], [place], [place] [address]          Radiology Studies: PERIPHERAL VASCULAR CATHETERIZATION  Result Date: 02/06/2021 See surgical note for result.       Scheduled Meds:  amiodarone  200 mg Oral Daily   artificial tears   Left Eye QID   atorvastatin  80 mg  Oral QHS   calcium acetate  2,001 mg Oral TID WC   carvedilol  25 mg Oral BID WC   folic acid  1 mg Oral Daily   heparin injection (subcutaneous)  5,000 Units Subcutaneous Q8H   methocarbamol  500 mg Oral Once   metroNIDAZOLE  500 mg Oral Q8H   vitamin B-12  1,000 mcg Oral Daily   Continuous Infusions:  sodium chloride     sodium chloride     ceFEPime (MAXIPIME) IV Stopped (02/06/21 1904)   vancomycin 1,000 mg (02/05/21 1757)     LOS: 3 days    Time spent: over 30 min    Fayrene Helper, MD [facility]   To contact the attending provider between 7A-7P or the covering provider during after hours 7P-7A, please log into the web site [email or web address] and access using universal [place] password for that web site. If you do not have the password, please call the hospital operator.  02/07/2021, 2:07 PM

## 2021-02-07 NOTE — Progress Notes (Signed)
Patient has uneventful HD session, Catheter functioned well, maintaining prescribed BFR No fluid removal this session. Meds given per MD order. And labs drawn. No concerns, report given to primary nurse, patient returned to assigned floor.

## 2021-02-07 NOTE — Progress Notes (Signed)
PT Cancellation Note  Patient Details Name: [name]. MRN: [id number] DOB: [birth year]   Cancelled Treatment:    Reason Eval/Treat Not Completed: Other (comment) PT orders received, chart reviewed. Pt has received blood transfusion but pt is now off the floor for dialysis. Will f/u as able & as pt is available.  Lavone Nian, PT, DPT 02/07/21, 1:22 PM    Waunita Schooner 02/07/2021, 1:21 PM

## 2021-02-08 ENCOUNTER — Encounter (HOSPITAL_COMMUNITY): Payer: Self-pay | Admitting: Family Medicine

## 2021-02-08 ENCOUNTER — Inpatient Hospital Stay (HOSPITAL_COMMUNITY)
Admission: AD | Admit: 2021-02-08 | Discharge: 2021-02-10 | DRG: 988 | Disposition: A | Discharge status: Home / Self Care | Payer: Medicare Other | Source: Other Acute Inpatient Hospital | Attending: Internal Medicine | Admitting: Internal Medicine

## 2021-02-08 ENCOUNTER — Inpatient Hospital Stay: Payer: Medicare Other

## 2021-02-08 DIAGNOSIS — H5462 Unqualified visual loss, left eye, normal vision right eye: Secondary | ICD-10-CM | POA: Diagnosis present

## 2021-02-08 DIAGNOSIS — Z888 Allergy status to other drugs, medicaments and biological substances status: Secondary | ICD-10-CM | POA: Diagnosis not present

## 2021-02-08 DIAGNOSIS — D509 Iron deficiency anemia, unspecified: Secondary | ICD-10-CM | POA: Diagnosis present

## 2021-02-08 DIAGNOSIS — E1143 Type 2 diabetes mellitus with diabetic autonomic (poly)neuropathy: Secondary | ICD-10-CM | POA: Diagnosis present

## 2021-02-08 DIAGNOSIS — I2510 Atherosclerotic heart disease of native coronary artery without angina pectoris: Secondary | ICD-10-CM | POA: Diagnosis present

## 2021-02-08 DIAGNOSIS — K3184 Gastroparesis: Secondary | ICD-10-CM | POA: Diagnosis present

## 2021-02-08 DIAGNOSIS — E538 Deficiency of other specified B group vitamins: Secondary | ICD-10-CM | POA: Diagnosis present

## 2021-02-08 DIAGNOSIS — Z992 Dependence on renal dialysis: Secondary | ICD-10-CM | POA: Diagnosis not present

## 2021-02-08 DIAGNOSIS — N186 End stage renal disease: Secondary | ICD-10-CM | POA: Diagnosis present

## 2021-02-08 DIAGNOSIS — Z7902 Long term (current) use of antithrombotics/antiplatelets: Secondary | ICD-10-CM | POA: Diagnosis not present

## 2021-02-08 DIAGNOSIS — M86141 Other acute osteomyelitis, right hand: Secondary | ICD-10-CM

## 2021-02-08 DIAGNOSIS — E1169 Type 2 diabetes mellitus with other specified complication: Principal | ICD-10-CM | POA: Diagnosis present

## 2021-02-08 DIAGNOSIS — D631 Anemia in chronic kidney disease: Secondary | ICD-10-CM | POA: Diagnosis present

## 2021-02-08 DIAGNOSIS — I10 Essential (primary) hypertension: Secondary | ICD-10-CM | POA: Diagnosis present

## 2021-02-08 DIAGNOSIS — E785 Hyperlipidemia, unspecified: Secondary | ICD-10-CM | POA: Diagnosis present

## 2021-02-08 DIAGNOSIS — Z881 Allergy status to other antibiotic agents status: Secondary | ICD-10-CM | POA: Diagnosis not present

## 2021-02-08 DIAGNOSIS — E559 Vitamin D deficiency, unspecified: Secondary | ICD-10-CM | POA: Diagnosis present

## 2021-02-08 DIAGNOSIS — I5032 Chronic diastolic (congestive) heart failure: Secondary | ICD-10-CM | POA: Diagnosis present

## 2021-02-08 DIAGNOSIS — E1122 Type 2 diabetes mellitus with diabetic chronic kidney disease: Secondary | ICD-10-CM | POA: Diagnosis present

## 2021-02-08 DIAGNOSIS — T82898A Other specified complication of vascular prosthetic devices, implants and grafts, initial encounter: Secondary | ICD-10-CM | POA: Diagnosis present

## 2021-02-08 DIAGNOSIS — Z818 Family history of other mental and behavioral disorders: Secondary | ICD-10-CM | POA: Diagnosis not present

## 2021-02-08 DIAGNOSIS — E1152 Type 2 diabetes mellitus with diabetic peripheral angiopathy with gangrene: Secondary | ICD-10-CM | POA: Diagnosis present

## 2021-02-08 DIAGNOSIS — E119 Type 2 diabetes mellitus without complications: Secondary | ICD-10-CM

## 2021-02-08 DIAGNOSIS — I132 Hypertensive heart and chronic kidney disease with heart failure and with stage 5 chronic kidney disease, or end stage renal disease: Secondary | ICD-10-CM | POA: Diagnosis present

## 2021-02-08 DIAGNOSIS — L089 Local infection of the skin and subcutaneous tissue, unspecified: Secondary | ICD-10-CM | POA: Diagnosis not present

## 2021-02-08 DIAGNOSIS — I480 Paroxysmal atrial fibrillation: Secondary | ICD-10-CM | POA: Diagnosis present

## 2021-02-08 DIAGNOSIS — M869 Osteomyelitis, unspecified: Secondary | ICD-10-CM | POA: Diagnosis present

## 2021-02-08 DIAGNOSIS — T148XXA Other injury of unspecified body region, initial encounter: Secondary | ICD-10-CM | POA: Diagnosis not present

## 2021-02-08 LAB — CBC WITH DIFFERENTIAL/PLATELET
Abs Immature Granulocytes: 0.13 10*3/uL — ABNORMAL HIGH (ref 0.00–0.07)
Basophils Absolute: 0.1 10*3/uL (ref 0.0–0.1)
Basophils Relative: 1 %
Eosinophils Absolute: 0.3 10*3/uL (ref 0.0–0.5)
Eosinophils Relative: 2 %
HCT: 24.3 % — ABNORMAL LOW (ref 39.0–52.0)
Hemoglobin: 7.4 g/dL — ABNORMAL LOW (ref 13.0–17.0)
Immature Granulocytes: 1 %
Lymphocytes Relative: 6 %
Lymphs Abs: 0.9 10*3/uL (ref 0.7–4.0)
MCH: 23.4 pg — ABNORMAL LOW (ref 26.0–34.0)
MCHC: 30.5 g/dL (ref 30.0–36.0)
MCV: 76.9 fL — ABNORMAL LOW (ref 80.0–100.0)
Monocytes Absolute: 1.7 10*3/uL — ABNORMAL HIGH (ref 0.1–1.0)
Monocytes Relative: 11 %
Neutro Abs: 12 10*3/uL — ABNORMAL HIGH (ref 1.7–7.7)
Neutrophils Relative %: 79 %
Platelets: 161 10*3/uL (ref 150–400)
RBC: 3.16 MIL/uL — ABNORMAL LOW (ref 4.22–5.81)
RDW: 18 % — ABNORMAL HIGH (ref 11.5–15.5)
Smear Review: NORMAL
WBC: 15.2 10*3/uL — ABNORMAL HIGH (ref 4.0–10.5)
nRBC: 0 % (ref 0.0–0.2)

## 2021-02-08 LAB — COMPREHENSIVE METABOLIC PANEL
ALT: <5 U/L (ref 0–44)
AST: 8 U/L — ABNORMAL LOW (ref 15–41)
Albumin: 2.2 g/dL — ABNORMAL LOW (ref 3.5–5.0)
Alkaline Phosphatase: 50 U/L (ref 38–126)
Anion gap: 11 (ref 5–15)
BUN: 40 mg/dL — ABNORMAL HIGH (ref 6–20)
CO2: 25 mmol/L (ref 22–32)
Calcium: 8.2 mg/dL — ABNORMAL LOW (ref 8.9–10.3)
Chloride: 98 mmol/L (ref 98–111)
Creatinine, Ser: 8.78 mg/dL — ABNORMAL HIGH (ref 0.61–1.24)
GFR, Estimated: 7 mL/min — ABNORMAL LOW (ref >60)
Glucose, Bld: 98 mg/dL (ref 70–99)
Potassium: 4.1 mmol/L (ref 3.5–5.1)
Sodium: 134 mmol/L — ABNORMAL LOW (ref 135–145)
Total Bilirubin: 0.7 mg/dL (ref 0.3–1.2)
Total Protein: 5.7 g/dL — ABNORMAL LOW (ref 6.5–8.1)

## 2021-02-08 LAB — TYPE AND SCREEN
ABO/RH(D): A POS
Antibody Screen: NEGATIVE
Unit division: 0
Unit division: 0

## 2021-02-08 LAB — BPAM RBC
Blood Product Expiration Date: 202301032359
Blood Product Expiration Date: 202301162359
ISSUE DATE / TIME: 202212161129
ISSUE DATE / TIME: 202212170853
Unit Type and Rh: 6200
Unit Type and Rh: 6200

## 2021-02-08 MED ORDER — ACETAMINOPHEN 325 MG PO TABS: 650.0000 mg | ORAL_TABLET | Freq: Four times a day (QID) | ORAL | Status: DC | PRN

## 2021-02-08 MED ORDER — CEFEPIME HCL 1 G IJ SOLR
1.0000 g | INTRAMUSCULAR | Status: DC
  Administered 2021-02-09 (×2): 1 g via INTRAVENOUS
  Filled 2021-02-08 (×3): qty 1

## 2021-02-08 MED ORDER — ATORVASTATIN CALCIUM 80 MG PO TABS
80.0000 mg | ORAL_TABLET | Freq: Every day | ORAL | Status: DC
  Administered 2021-02-08 – 2021-02-09 (×2): 80 mg via ORAL
  Filled 2021-02-08 (×2): qty 1

## 2021-02-08 MED ORDER — SENNOSIDES-DOCUSATE SODIUM 8.6-50 MG PO TABS: 1.0000 | ORAL_TABLET | Freq: Every evening | ORAL | Status: DC | PRN

## 2021-02-08 MED ORDER — FOLIC ACID 1 MG PO TABS
1.0000 mg | ORAL_TABLET | Freq: Every day | ORAL | Status: DC
  Administered 2021-02-09 – 2021-02-10 (×2): 1 mg via ORAL
  Filled 2021-02-08 (×2): qty 1

## 2021-02-08 MED ORDER — CALCIUM ACETATE (PHOS BINDER) 667 MG PO CAPS
2001.0000 mg | ORAL_CAPSULE | Freq: Three times a day (TID) | ORAL | Status: DC
  Filled 2021-02-08 (×2): qty 3

## 2021-02-08 MED ORDER — HYDROMORPHONE HCL 1 MG/ML IJ SOLN
1.0000 mg | INTRAMUSCULAR | Status: DC | PRN
  Administered 2021-02-08 – 2021-02-10 (×2): 1 mg via INTRAVENOUS
  Filled 2021-02-08 (×2): qty 1

## 2021-02-08 MED ORDER — CARVEDILOL 25 MG PO TABS
25.0000 mg | ORAL_TABLET | Freq: Two times a day (BID) | ORAL | Status: DC
  Administered 2021-02-09: 08:00:00 25 mg via ORAL
  Filled 2021-02-08: qty 1

## 2021-02-08 MED ORDER — ACETAMINOPHEN 650 MG RE SUPP: 650.0000 mg | Freq: Four times a day (QID) | RECTAL | Status: DC | PRN

## 2021-02-08 MED ORDER — AMIODARONE HCL 200 MG PO TABS
200.0000 mg | ORAL_TABLET | Freq: Every day | ORAL | Status: DC
  Administered 2021-02-09: 08:00:00 200 mg via ORAL
  Filled 2021-02-08: qty 1

## 2021-02-08 MED ORDER — HYDROCODONE-ACETAMINOPHEN 5-325 MG PO TABS: 2.0000 | ORAL_TABLET | Freq: Two times a day (BID) | ORAL | Status: DC

## 2021-02-08 MED ORDER — VITAMIN B-12 1000 MCG PO TABS: 1000.0000 ug | ORAL_TABLET | Freq: Every day | ORAL | Status: DC

## 2021-02-08 MED ORDER — METRONIDAZOLE 500 MG/100ML IV SOLN
500.0000 mg | Freq: Two times a day (BID) | INTRAVENOUS | Status: DC
  Administered 2021-02-08 – 2021-02-10 (×4): 500 mg via INTRAVENOUS
  Filled 2021-02-08 (×4): qty 100

## 2021-02-08 MED ORDER — NITROGLYCERIN 0.4 MG SL SUBL: 0.4000 mg | SUBLINGUAL_TABLET | SUBLINGUAL | Status: DC | PRN

## 2021-02-08 MED ORDER — VANCOMYCIN HCL IN DEXTROSE 1-5 GM/200ML-% IV SOLN
1000.0000 mg | INTRAVENOUS | Status: DC
  Filled 2021-02-08: qty 200

## 2021-02-08 MED ORDER — SODIUM CHLORIDE 0.9 % IV SOLN: 250.0000 mL | INTRAVENOUS | Status: DC | PRN

## 2021-02-08 MED ORDER — SODIUM CHLORIDE 0.9% FLUSH: 3.0000 mL | INTRAVENOUS | Status: DC | PRN

## 2021-02-08 MED ORDER — SODIUM CHLORIDE 0.9% FLUSH
3.0000 mL | Freq: Two times a day (BID) | INTRAVENOUS | Status: DC
  Administered 2021-02-09: 08:00:00 3 mL via INTRAVENOUS

## 2021-02-08 MED ORDER — CYANOCOBALAMIN 1000 MCG PO TABS
1000.0000 ug | ORAL_TABLET | Freq: Every day | ORAL | 0 refills | Status: AC
Start: 2021-02-09 — End: 2021-03-11

## 2021-02-08 MED ORDER — ARTIFICIAL TEARS OPHTHALMIC OINT: TOPICAL_OINTMENT | Freq: Four times a day (QID) | OPHTHALMIC | Status: DC

## 2021-02-08 MED ORDER — FOLIC ACID 1 MG PO TABS
1.0000 mg | ORAL_TABLET | Freq: Every day | ORAL | 0 refills | Status: AC
Start: 2021-02-09 — End: 2021-03-11

## 2021-02-08 MED ORDER — TAMSULOSIN HCL 0.4 MG PO CAPS
0.4000 mg | ORAL_CAPSULE | Freq: Every day | ORAL | Status: DC
  Administered 2021-02-09 – 2021-02-10 (×2): 0.4 mg via ORAL
  Filled 2021-02-08 (×2): qty 1

## 2021-02-08 NOTE — Progress Notes (Signed)
Pharmacy Antibiotic Note  [name]. is a 50 y.o. male admitted on 12/14 with recurrent R hand infection and osteomyelitis of ring finger. Pt was started on vancomycin, cefepime, and metronidazole - pharmacy to continue upon transfer from [facility]. Pt has ESRD on HD TTS with last session 12/17. Cefepime dose given prior to transfer this evening.   Plan: Continue vancomycin 1000mg  qHD TTS Continue cefepime 1g IV qHS     Temp (24hrs), Avg:99.1 F (37.3 C), Min:98.2 F (36.8 C), Max:99.8 F (37.7 C)  Recent Labs  Lab 02/04/21 1628 02/05/21 0143 02/06/21 0433 02/07/21 0332 02/08/21 0757  WBC 10.8* 12.6* 11.9* 13.1* 15.2*  CREATININE 17.99* 17.53* 10.72* 12.24* 8.78*  LATICACIDVEN 0.6  --   --   --   --     Estimated Creatinine Clearance: 11.8 mL/min (A) (by C-G formula based on SCr of 8.78 mg/dL (H)).    Allergies  Allergen Reactions   Levofloxacin Swelling    Of face Other reaction(s): swelling   Promethazine Diarrhea and Other (See Comments)    Other reaction(s): Other (See Comments), Other (See Comments) Other reaction(s): Muscle cramps Muscle cramps Cramping Other reaction(s): Muscle cramps Muscle cramps   Other     Other reaction(s): Unknown   Malt Other (See Comments)    Other reaction(s): cramping    Antimicrobials this admission: Vancomycin 12/14 >>  Cefepime 12/14 >>  Metronidazole 12/15 >>   Microbiology results: 12/14 BCx: NGTD  12/14 MRSA PCR: negative  Thank you for allowing pharmacy to be a part of this patients care.  Arrie Senate, PharmD, BCPS, [facility] Clinical Pharmacist [phone] Please check AMION for all [place] numbers 02/08/2021

## 2021-02-08 NOTE — H&P (View-Only) (Signed)
HAND SURGERY CONSULTATION  REQUESTING PHYSICIAN: Chotiner, Yevonne Aline, MD   Chief Complaint: right finger infection  HPI: [name]. is a 50 y.o. male w/ complicated PMH including ESRD on HD, T2DM, HTN, CAD who presents with wet gangrene involving the right ring finger at the level of a previous transphalangeal amputation.  Pt was planning on elective revision amputation of this finger last week for infection and dehiscence but was admitted for chills and malaise.  He was transferred to [facility] today from [facility] after being there for several days.  He recently underwent embolization of the right AV fistula for steal syndrome with angioplasty of the radial and ulnar arteries.  He complains of pain at the stump of the ring finger with foul odor.  He has generalized hand swelling but no pain beyond the ring finger.  He thinks his sensation has improved since the vascular procedures.  He was previously febrile but has been afebrile today.   Hand dominance: Right Occupation: On disability  Past Medical History:  Diagnosis Date   Anxiety    Cellulitis    CHF (congestive heart failure) (HCC)    Chronic diarrhea    Chronic kidney disease    Chronic pain of both ankles    Diabetes mellitus without complication (HCC)    Diabetes mellitus, type II (HCC)    Diabetic neuropathy (HCC)    Frequent falls    Gait instability    GERD (gastroesophageal reflux disease)    Heart palpitations    Hemodialysis patient ([place])    History of kidney stones    HLD (hyperlipidemia)    Hypertension    Insomnia    Nausea and vomiting in adult    recurrent   Past Surgical History:  Procedure Laterality Date   AMPUTATION Left 03/16/2016   Procedure: AMPUTATION DIGIT LEFT HALLUX;  Surgeon: Trula Slade, DPM;  Location: [place];  Service: Podiatry;  Laterality: Left;  can start around 3    ARTHROSCOPIC REPAIR ACL Left    AV FISTULA PLACEMENT Right 05/31/2019   Procedure: Brachiocephalic AV fistula  creation;  Surgeon: Algernon Huxley, MD;  Location: [name] ORS;  Service: Vascular;  Laterality: Right;   COLONOSCOPY WITH PROPOFOL N/A 10/28/2015   Procedure: COLONOSCOPY WITH PROPOFOL;  Surgeon: Lollie Sails, MD;  Location: [facility] ENDOSCOPY;  Service: Endoscopy;  Laterality: N/A;   COLONOSCOPY WITH PROPOFOL N/A 10/29/2015   Procedure: COLONOSCOPY WITH PROPOFOL;  Surgeon: Lollie Sails, MD;  Location: [facility] ENDOSCOPY;  Service: Endoscopy;  Laterality: N/A;   CORONARY/GRAFT ACUTE MI REVASCULARIZATION N/A 02/26/2020   Procedure: Coronary/Graft Acute MI Revascularization;  Surgeon: Jettie Booze, MD;  Location: [place] CV LAB;  Service: Cardiovascular;  Laterality: N/A;   DIALYSIS/PERMA CATHETER INSERTION Right 04/26/2019   Perm Cath    DIALYSIS/PERMA CATHETER INSERTION N/A 04/26/2019   Procedure: DIALYSIS/PERMA CATHETER INSERTION;  Surgeon: Algernon Huxley, MD;  Location: [place] CV LAB;  Service: Cardiovascular;  Laterality: N/A;   DIALYSIS/PERMA CATHETER REMOVAL N/A 08/15/2019   Procedure: DIALYSIS/PERMA CATHETER REMOVAL;  Surgeon: Katha Cabal, MD;  Location: [place] CV LAB;  Service: Cardiovascular;  Laterality: N/A;   ESOPHAGOGASTRODUODENOSCOPY (EGD) WITH PROPOFOL N/A 12/27/2017   Procedure: ESOPHAGOGASTRODUODENOSCOPY (EGD) WITH PROPOFOL;  Surgeon: Toledo, Benay Pike, MD;  Location: [name] ENDOSCOPY;  Service: Gastroenterology;  Laterality: N/A;   INTRAVASCULAR ULTRASOUND/IVUS N/A 02/26/2020   Procedure: Intravascular Ultrasound/IVUS;  Surgeon: Jettie Booze, MD;  Location: [place] CV LAB;  Service: Cardiovascular;  Laterality: N/A;   LEFT HEART CATH AND CORONARY ANGIOGRAPHY N/A 02/26/2020   Procedure: LEFT HEART CATH AND CORONARY ANGIOGRAPHY;  Surgeon: Jettie Booze, MD;  Location: [place] CV LAB;  Service: Cardiovascular;  Laterality: N/A;   PROSTATE SURGERY  2016   TONSILECTOMY/ADENOIDECTOMY WITH MYRINGOTOMY     TONSILLECTOMY     UPPER EXTREMITY  ANGIOGRAPHY Right 11/26/2020   Procedure: Upper Extremity Angiography;  Surgeon: Algernon Huxley, MD;  Location: [place] CV LAB;  Service: Cardiovascular;  Laterality: Right;   Social History   Socioeconomic History   Marital status: Married    Spouse name: [name]    Number of children: 2   Years of education: Not on file   Highest education level: High school graduate  Occupational History   Occupation: Disability    Comment: not employed  Tobacco Use   Smoking status: Never   Smokeless tobacco: Never  Vaping Use   Vaping Use: Never used  Substance and Sexual Activity   Alcohol use: No   Drug use: No   Sexual activity: Yes  Other Topics Concern   Not on file  Social History Narrative   Oldest son killed in car crash June 2020.    Social Determinants of Health   Financial Resource Strain: Not on file  Food Insecurity: Not on file  Transportation Needs: No Transportation Needs   Lack of Transportation (Medical): No   Lack of Transportation (Non-Medical): No  Physical Activity: Not on file  Stress: Not on file  Social Connections: Not on file   Family History  Problem Relation Age of Onset   CAD Father    Stroke Father    Diabetes Mellitus II Mother    Kidney failure Mother    Schizophrenia Mother    - negative except otherwise stated in the family history section Allergies  Allergen Reactions   Levofloxacin Swelling    Of face Other reaction(s): swelling   Promethazine Diarrhea and Other (See Comments)    Other reaction(s): Other (See Comments), Other (See Comments) Other reaction(s): Muscle cramps Muscle cramps Cramping Other reaction(s): Muscle cramps Muscle cramps   Other     Other reaction(s): Unknown   Malt Other (See Comments)    Other reaction(s): cramping   Prior to Admission medications   Medication Sig Start Date End Date Taking? Authorizing Provider  acetaminophen (TYLENOL) 325 MG tablet Take 2 tablets (650 mg total) by mouth every 6  (six) hours as needed for mild pain (or Fever >/= 101). 01/30/21   Richarda Osmond, MD  amiodarone (PACERONE) 200 MG tablet Take 1 tablet (200 mg total) by mouth daily. 1 twice daily for 2 weeks, then 1 daily 01/09/21   Eugenie Filler, MD  apixaban (ELIQUIS) 5 MG TABS tablet Take 5 mg by mouth 2 (two) times daily.    [provider]  artificial tears (LACRILUBE) OINT ophthalmic ointment Place into the left eye 4 (four) times daily. 02/08/21   Elodia Florence., MD  atorvastatin (LIPITOR) 80 MG tablet TAKE 1 TABLET (80 MG TOTAL) BY MOUTH AT BEDTIME. 02/28/20 02/27/21  Reino Bellis B, NP  azelastine (ASTELIN) 0.1 % nasal spray Place 2 sprays into both nostrils 2 (two) times daily. 09/08/20   [provider]  calcium acetate (PHOSLO) 667 MG capsule Take 3 capsules (2,001 mg total) by mouth 3 (three) times daily with meals. 11/27/20   Sharen Hones, MD  carvedilol (COREG) 25 MG tablet Take 1 tablet (25 mg  total) by mouth 2 (two) times daily with a meal. 12/09/20   Johnson, Clanford L, MD  clopidogrel (PLAVIX) 75 MG tablet Take 1 tablet (75 mg total) by mouth daily. 12/09/20   Johnson, Clanford L, MD  FEROSUL 325 (65 Fe) MG tablet Take 325 mg by mouth daily. 09/08/20   [provider]  fluticasone (FLONASE) 50 MCG/ACT nasal spray Place 2 sprays into both nostrils daily. Patient not taking: Reported on 02/04/2021 09/08/20   [provider]  folic acid (FOLVITE) 1 MG tablet Take 1 tablet (1 mg total) by mouth daily. 02/09/21 03/11/21  Elodia Florence., MD  HYDROcodone-acetaminophen (NORCO/VICODIN) 5-325 MG tablet Take 2 tablets by mouth 2 (two) times daily. 01/23/21   [provider]  lidocaine-prilocaine (EMLA) cream Apply 1 application topically Every Tuesday,Thursday,and Saturday with dialysis.  Patient not taking: Reported on 02/04/2021 08/15/19   [provider]  nitroGLYCERIN (NITROSTAT) 0.4 MG SL tablet Place 1 tablet (0.4 mg total)  under the tongue every 5 (five) minutes as needed for chest pain. 12/09/20 12/09/21  Johnson, Clanford L, MD  prednisoLONE acetate (PRED FORTE) 1 % ophthalmic suspension Place 1 drop into the left eye every 4 (four) hours.    [provider]  tamsulosin (FLOMAX) 0.4 MG CAPS capsule Take 0.4 mg by mouth daily after breakfast.     [provider]  vitamin B-12 1000 MCG tablet Take 1 tablet (1,000 mcg total) by mouth daily. 02/09/21 03/11/21  Elodia Florence., MD   MR HAND RIGHT WO CONTRAST  Result Date: 02/08/2021 CLINICAL DATA:  Osteomyelitis, hand EXAM: MRI OF THE RIGHT HAND WITHOUT CONTRAST TECHNIQUE: Multiplanar, multisequence MR imaging of the right hand was performed. No intravenous contrast was administered. COMPARISON:  Radiograph 02/04/2021, 01/28/2021, 11/24/2020. FINDINGS: Bones/Joint/Cartilage Prior ring finger amputation with residual proximal phalanx. There is bony edema and mildly low T1 signal within the proximal phalanx. Ligaments Intact MCP collateral ligaments. No pulley injury of the non amputated digits. Muscles and Tendons Retracted ring finger flexor tendons due to amputation. No other acute tendon tear. No tenosynovitis. Extensive intramuscular edema throughout the hand, ulnar-sided worse than radial. Soft tissues Extensive soft tissue swelling of the hand. There is no well-defined fluid collection. IMPRESSION: Findings are consistent with osteomyelitis of the residual ring finger proximal phalanx. Extensive soft tissue swelling and intramuscular edema throughout the hand, without well-defined fluid collection. Electronically Signed   By: Maurine Simmering M.D.   On: 02/08/2021 12:51   - pertinent xrays, CT, MRI studies were reviewed and independently interpreted  Positive ROS: All other systems have been reviewed and were otherwise negative with the exception of those mentioned in the HPI and as above.  Physical Exam: General: No acute distress, resting  comfortably Cardiovascular: LUE warm and well perfused, remaining fingers on right hand are warm and well perfused, normal HR Respiratory: No cyanosis, no use of accessory musculature Skin: Warm and dry Neurologic: Diminished sensation at baseline Psychiatric: Patient is at baseline mood and affect  Right hand  Vascular: Thumb/IF/LF/SF warm with intact cap refill.  No ulcers or lesions at tips of fingers.   Wet gangrene of residual ring finger proximal phalanx with non healing tip and purulent/fibrinous material.    No TTP in hand or remaining fingers   Assessment: 50 yo M w/ osteomyelitis involving right ring finger proximal phalanx.  Vascularity of remainder of right hand seems to be improved following the vascular surgery intervention.  No evidence of infection extending proximal  to the ring finger proximal phalanx.   Plan: OR tomorrow for right ring finger ray resection due to OR availability tonight. Continue IV abx NPO in the morning for afternoon procedure Remainder per primary team  Thank you for the consult and the opportunity to see [name], M.D. [facility] 8:15 PM

## 2021-02-08 NOTE — Progress Notes (Signed)
[facility]  ROUNDING NOTE   Subjective:   [name]. is a 50 year old male with a past medical history significant for diabetes, hypertension, diastolic CHF, CAD with STEMI January 2022, and end stage renal disease on dialysis. Patient presents to emergency room from [facility] for orthopedic evaluation. Patient has been admitted for Osteomyelitis [facility]) [M86.9] Wound infection [T14.8XXA, L08.9]   Patient is known to our clinic and receives outpatient treatment from [facility] on a TTS schedule. He is supervised by Dr Juleen China.   Update: Patient sitting up in bed Wife at bedside assisting with breakfast No complaints this morning   Objective:  Vital signs in last 24 hours:  Temp:  [98.2 F (36.8 C)-100 F (37.8 C)] 98.2 F (36.8 C) (12/18 0816) Pulse Rate:  [63-73] 64 (12/18 0816) Resp:  [10-21] 18 (12/18 0816) BP: (127-199)/(67-85) 164/69 (12/18 0816) SpO2:  [92 %-100 %] 95 % (12/18 0816) Weight:  [93.6 kg] 93.6 kg (12/17 1305)  Weight change:  Filed Weights   02/05/21 1252 02/07/21 1305  Weight: 93.1 kg 93.6 kg     Intake/Output: I/O last 3 completed shifts: In: 1118 [P.O.:480; Blood:338; IV Piggyback:300] Out: 0    Intake/Output this shift:  No intake/output data recorded.  Physical Exam: General: NAD, sitting up  Head: Normocephalic, atraumatic. Moist oral mucosal membranes  Eyes: Anicteric  Lungs:  Clear to auscultation, normal effort  Heart: Regular rate and rhythm  Abdomen:  Soft, nontender  Extremities:  no peripheral edema.  Neurologic: Nonfocal, moving all four extremities  Skin:  Right 4th finger wound dressing  Access: LUE AVF(ligation 02/06/21) Rt Permcath placed on 67/20/94    Basic Metabolic Panel: Recent Labs  Lab 02/04/21 1628 02/04/21 2340 02/05/21 0143 02/06/21 0433 02/07/21 0332 02/08/21 0757  NA 149*  --  146* 141 140 134*  K 5.3*  --  5.8* 4.1 4.8 4.1  CL 121*  --  120* 108 106 98  CO2 10*  --   10* 22 21* 25  GLUCOSE 70  --  85 97 81 98  BUN 79*  --  82* 49* 65* 40*  CREATININE 17.99*  --  17.53* 10.72* 12.24* 8.78*  CALCIUM 8.5*  --  8.3* 8.1* 8.1* 8.2*  MG  --  2.5*  --  1.8 1.9  --   PHOS  --  11.5*  --  6.2* 8.8*  --      Liver Function Tests: Recent Labs  Lab 02/04/21 1628 02/06/21 0433 02/07/21 0332 02/08/21 0757  AST 11* 11* 10* 8*  ALT 14 11 6  <5  ALKPHOS 64 57 52 50  BILITOT 0.2* 0.8 0.8 0.7  PROT 7.0 5.9* 6.0* 5.7*  ALBUMIN 3.0* 2.5* 2.5* 2.2*    No results for input(s): LIPASE, AMYLASE in the last 168 hours. No results for input(s): AMMONIA in the last 168 hours.  CBC: Recent Labs  Lab 02/04/21 1628 02/05/21 0143 02/06/21 0433 02/07/21 0332 02/07/21 1628 02/08/21 0757  WBC 10.8* 12.6* 11.9* 13.1*  --  15.2*  NEUTROABS 7.5  --  8.8* 10.0*  --  12.0*  HGB 7.4* 7.2* 6.5* 6.8* 7.6* 7.4*  HCT 25.5* 23.5* 20.6* 21.8* 23.6* 24.3*  MCV 81.0 78.3* 75.2* 75.4*  --  76.9*  PLT 270 257 216 186  --  161     Cardiac Enzymes: No results for input(s): CKTOTAL, CKMB, CKMBINDEX, TROPONINI in the last 168 hours.  BNP: Invalid input(s): POCBNP  CBG: No results for input(s): GLUCAP  in the last 168 hours.   Microbiology: Results for orders placed or performed during the hospital encounter of 02/04/21  CULTURE, BLOOD (ROUTINE X 2) w Reflex to ID Panel     Status: None (Preliminary result)   Collection Time: 02/04/21 11:40 PM   Specimen: BLOOD  Result Value Ref Range Status   Specimen Description BLOOD LEFT ASSIST CONTROL  Final   Special Requests   Final    BOTTLES DRAWN AEROBIC AND ANAEROBIC Blood Culture adequate volume   Culture   Final    NO GROWTH 3 DAYS Performed at [facility], [address] [place], [place] [address]    Report Status PENDING  Incomplete  CULTURE, BLOOD (ROUTINE X 2) w Reflex to ID Panel     Status: None (Preliminary result)   Collection Time: 02/04/21 11:40 PM   Specimen: BLOOD  Result Value Ref Range Status    Specimen Description BLOOD LEFT HAND  Final   Special Requests   Final    BOTTLES DRAWN AEROBIC AND ANAEROBIC Blood Culture adequate volume   Culture   Final    NO GROWTH 3 DAYS Performed at [facility], [address] [place], [place] [address]    Report Status PENDING  Incomplete  MRSA Next Gen by PCR, Nasal     Status: None   Collection Time: 02/05/21  1:24 AM   Specimen: Nasal Mucosa; Nasal Swab  Result Value Ref Range Status   MRSA by PCR Next Gen NOT DETECTED NOT DETECTED Final    Comment: (NOTE) The GeneXpert MRSA Assay (FDA approved for NASAL specimens only), is one component of a comprehensive MRSA colonization surveillance program. It is not intended to diagnose MRSA infection nor to guide or monitor treatment for MRSA infections. Test performance is not FDA approved in patients less than 49 years old. Performed at [facility], [address] [place], [place] [address]     Coagulation Studies: No results for input(s): LABPROT, INR in the last 72 hours.  Urinalysis: No results for input(s): COLORURINE, LABSPEC, PHURINE, GLUCOSEU, HGBUR, BILIRUBINUR, KETONESUR, PROTEINUR, UROBILINOGEN, NITRITE, LEUKOCYTESUR in the last 72 hours.  Invalid input(s): APPERANCEUR    Imaging: PERIPHERAL VASCULAR CATHETERIZATION  Result Date: 02/06/2021 See surgical note for result.    Medications:    ceFEPime (MAXIPIME) IV Stopped (02/07/21 1832)   vancomycin 1,000 mg (02/07/21 1514)    amiodarone  200 mg Oral Daily   artificial tears   Left Eye QID   atorvastatin  80 mg Oral QHS   calcium acetate  2,001 mg Oral TID WC   carvedilol  25 mg Oral BID WC   folic acid  1 mg Oral Daily   heparin injection (subcutaneous)  5,000 Units Subcutaneous Q8H   methocarbamol  500 mg Oral Once   metroNIDAZOLE  500 mg Oral Q8H   vitamin B-12  1,000 mcg Oral Daily   labetalol, nitroGLYCERIN, ondansetron (ZOFRAN) IV  Assessment/ Plan:  [name]. is a  50 y.o.  male with a past medical history significant for diabetes, hypertension, diastolic CHF, CAD with STEMI January 2022, and end stage renal disease on dialysis. Patiet presents to emergency room for further evaluation of right, fourth finger treatment. Patient has been admitted for Osteomyelitis [facility]) [M86.9] Wound infection [T14.8XXA, L08.9]  [facility] TTS [facility] Left AVF 93kg  End stage renal disease on dialysis. Received dialysis yesterday with no UF. Tolerated well. Next treatment scheduled for Tuesday.   2. Anemia of  chronic kidney disease.  Lab Results  Component Value Date   HGB 7.4 (L) 02/08/2021  Improved with second transfusion yesterday. Continue EPO with treatments.   3. Secondary Hyperparathyroidism:  Lab Results  Component Value Date   CALCIUM 8.2 (L) 02/08/2021   CAION 0.94 (L) 01/08/2021   PHOS 8.8 (H) 02/07/2021  Calcium within acceptable range. Phosphorus remains elevated Continue calcium acetate with meals  4. Hypertension with chronic kidney disease. Home regimen includes losartan, carvedilol and amiodarone. Losartan currently held.  Currently receiving carvedilol only.  5. Osteomyelitis of Rt fourth digit. Currently on antibiotic therapy with Cefepime and Vancomycin. Underwent right upper extremity angiogram on 02/05/2021 showing steal with majority blood flow going to fistula and little going to hand.   -Vascular performed ligation on 02/06/21 and placed permacath for HD access.     LOS: 4   12/18/202210:16 AM

## 2021-02-08 NOTE — Consult Note (Addendum)
HAND SURGERY CONSULTATION  REQUESTING PHYSICIAN: Chotiner, Yevonne Aline, MD   Chief Complaint: right finger infection  HPI: [name]. is a 50 y.o. male w/ complicated PMH including ESRD on HD, T2DM, HTN, CAD who presents with wet gangrene involving the right ring finger at the level of a previous transphalangeal amputation.  Pt was planning on elective revision amputation of this finger last week for infection and dehiscence but was admitted for chills and malaise.  He was transferred to [facility] today from [facility] after being there for several days.  He recently underwent embolization of the right AV fistula for steal syndrome with angioplasty of the radial and ulnar arteries.  He complains of pain at the stump of the ring finger with foul odor.  He has generalized hand swelling but no pain beyond the ring finger.  He thinks his sensation has improved since the vascular procedures.  He was previously febrile but has been afebrile today.   Hand dominance: Right Occupation: On disability  Past Medical History:  Diagnosis Date   Anxiety    Cellulitis    CHF (congestive heart failure) (HCC)    Chronic diarrhea    Chronic kidney disease    Chronic pain of both ankles    Diabetes mellitus without complication (HCC)    Diabetes mellitus, type II (HCC)    Diabetic neuropathy (HCC)    Frequent falls    Gait instability    GERD (gastroesophageal reflux disease)    Heart palpitations    Hemodialysis patient ([place])    History of kidney stones    HLD (hyperlipidemia)    Hypertension    Insomnia    Nausea and vomiting in adult    recurrent   Past Surgical History:  Procedure Laterality Date   AMPUTATION Left 03/16/2016   Procedure: AMPUTATION DIGIT LEFT HALLUX;  Surgeon: Trula Slade, DPM;  Location: [place];  Service: Podiatry;  Laterality: Left;  can start around 3    ARTHROSCOPIC REPAIR ACL Left    AV FISTULA PLACEMENT Right 05/31/2019   Procedure: Brachiocephalic AV fistula  creation;  Surgeon: Algernon Huxley, MD;  Location: [name];  Service: Vascular;  Laterality: Right;   COLONOSCOPY WITH PROPOFOL N/A 10/28/2015   Procedure: COLONOSCOPY WITH PROPOFOL;  Surgeon: Lollie Sails, MD;  Location: [facility] ENDOSCOPY;  Service: Endoscopy;  Laterality: N/A;   COLONOSCOPY WITH PROPOFOL N/A 10/29/2015   Procedure: COLONOSCOPY WITH PROPOFOL;  Surgeon: Lollie Sails, MD;  Location: [facility] ENDOSCOPY;  Service: Endoscopy;  Laterality: N/A;   CORONARY/GRAFT ACUTE MI REVASCULARIZATION N/A 02/26/2020   Procedure: Coronary/Graft Acute MI Revascularization;  Surgeon: Jettie Booze, MD;  Location: [place] [facility];  Service: Cardiovascular;  Laterality: N/A;   DIALYSIS/PERMA CATHETER INSERTION Right 04/26/2019   Perm Cath    DIALYSIS/PERMA CATHETER INSERTION N/A 04/26/2019   Procedure: DIALYSIS/PERMA CATHETER INSERTION;  Surgeon: Algernon Huxley, MD;  Location: [place] [facility];  Service: Cardiovascular;  Laterality: N/A;   DIALYSIS/PERMA CATHETER REMOVAL N/A 08/15/2019   Procedure: DIALYSIS/PERMA CATHETER REMOVAL;  Surgeon: Katha Cabal, MD;  Location: [place] [facility];  Service: Cardiovascular;  Laterality: N/A;   ESOPHAGOGASTRODUODENOSCOPY (EGD) WITH PROPOFOL N/A 12/27/2017   Procedure: ESOPHAGOGASTRODUODENOSCOPY (EGD) WITH PROPOFOL;  Surgeon: Toledo, Benay Pike, MD;  Location: [name] ENDOSCOPY;  Service: Gastroenterology;  Laterality: N/A;   INTRAVASCULAR ULTRASOUND/IVUS N/A 02/26/2020   Procedure: Intravascular Ultrasound/IVUS;  Surgeon: Jettie Booze, MD;  Location: [place] [facility];  Service: Cardiovascular;  Laterality: N/A;   LEFT HEART CATH AND CORONARY ANGIOGRAPHY N/A 02/26/2020   Procedure: LEFT HEART CATH AND CORONARY ANGIOGRAPHY;  Surgeon: Jettie Booze, MD;  Location: [place] [facility];  Service: Cardiovascular;  Laterality: N/A;   PROSTATE SURGERY  2016   TONSILECTOMY/ADENOIDECTOMY WITH MYRINGOTOMY     TONSILLECTOMY     UPPER EXTREMITY  ANGIOGRAPHY Right 11/26/2020   Procedure: Upper Extremity Angiography;  Surgeon: Algernon Huxley, MD;  Location: [place] [facility];  Service: Cardiovascular;  Laterality: Right;   Social History   Socioeconomic History   Marital status: Married    Spouse name: [name]    Number of children: 2   Years of education: Not on file   Highest education level: High school graduate  Occupational History   Occupation: Disability    Comment: not employed  Tobacco Use   Smoking status: Never   Smokeless tobacco: Never  Vaping Use   Vaping Use: Never used  Substance and Sexual Activity   Alcohol use: No   Drug use: No   Sexual activity: Yes  Other Topics Concern   Not on file  Social History Narrative   Oldest son killed in car crash June 2020.    Social Determinants of Health   Financial Resource Strain: Not on file  Food Insecurity: Not on file  Transportation Needs: No Transportation Needs   Lack of Transportation (Medical): No   Lack of Transportation (Non-Medical): No  Physical Activity: Not on file  Stress: Not on file  Social Connections: Not on file   Family History  Problem Relation Age of Onset   CAD Father    Stroke Father    Diabetes Mellitus II Mother    Kidney failure Mother    Schizophrenia Mother    - negative except otherwise stated in the family history section Allergies  Allergen Reactions   Levofloxacin Swelling    Of face Other reaction(s): swelling   Promethazine Diarrhea and Other (See Comments)    Other reaction(s): Other (See Comments), Other (See Comments) Other reaction(s): Muscle cramps Muscle cramps Cramping Other reaction(s): Muscle cramps Muscle cramps   Other     Other reaction(s): Unknown   Malt Other (See Comments)    Other reaction(s): cramping   Prior to Admission medications   Medication Sig Start Date End Date Taking? Authorizing Provider  acetaminophen (TYLENOL) 325 MG tablet Take 2 tablets (650 mg total) by mouth every 6  (six) hours as needed for mild pain (or Fever >/= 101). 01/30/21   Richarda Osmond, MD  amiodarone (PACERONE) 200 MG tablet Take 1 tablet (200 mg total) by mouth daily. 1 twice daily for 2 weeks, then 1 daily 01/09/21   Eugenie Filler, MD  apixaban (ELIQUIS) 5 MG TABS tablet Take 5 mg by mouth 2 (two) times daily.    [provider]  artificial tears (LACRILUBE) OINT ophthalmic ointment Place into the left eye 4 (four) times daily. 02/08/21   Elodia Florence., MD  atorvastatin (LIPITOR) 80 MG tablet TAKE 1 TABLET (80 MG TOTAL) BY MOUTH AT BEDTIME. 02/28/20 02/27/21  Reino Bellis B, NP  azelastine (ASTELIN) 0.1 % nasal spray Place 2 sprays into both nostrils 2 (two) times daily. 09/08/20   [provider]  calcium acetate (PHOSLO) 667 MG capsule Take 3 capsules (2,001 mg total) by mouth 3 (three) times daily with meals. 11/27/20   Sharen Hones, MD  carvedilol (COREG) 25 MG tablet Take 1 tablet (25 mg  total) by mouth 2 (two) times daily with a meal. 12/09/20   Johnson, Clanford L, MD  clopidogrel (PLAVIX) 75 MG tablet Take 1 tablet (75 mg total) by mouth daily. 12/09/20   Johnson, Clanford L, MD  FEROSUL 325 (65 Fe) MG tablet Take 325 mg by mouth daily. 09/08/20   [provider]  fluticasone (FLONASE) 50 MCG/ACT nasal spray Place 2 sprays into both nostrils daily. Patient not taking: Reported on 02/04/2021 09/08/20   [provider]  folic acid (FOLVITE) 1 MG tablet Take 1 tablet (1 mg total) by mouth daily. 02/09/21 03/11/21  Elodia Florence., MD  HYDROcodone-acetaminophen (NORCO/VICODIN) 5-325 MG tablet Take 2 tablets by mouth 2 (two) times daily. 01/23/21   [provider]  lidocaine-prilocaine (EMLA) cream Apply 1 application topically Every Tuesday,Thursday,and Saturday with dialysis.  Patient not taking: Reported on 02/04/2021 08/15/19   [provider]  nitroGLYCERIN (NITROSTAT) 0.4 MG SL tablet Place 1 tablet (0.4 mg total)  under the tongue every 5 (five) minutes as needed for chest pain. 12/09/20 12/09/21  Johnson, Clanford L, MD  prednisoLONE acetate (PRED FORTE) 1 % ophthalmic suspension Place 1 drop into the left eye every 4 (four) hours.    [provider]  tamsulosin (FLOMAX) 0.4 MG CAPS capsule Take 0.4 mg by mouth daily after breakfast.     [provider]  vitamin B-12 1000 MCG tablet Take 1 tablet (1,000 mcg total) by mouth daily. 02/09/21 03/11/21  Elodia Florence., MD   MR HAND RIGHT WO CONTRAST  Result Date: 02/08/2021 CLINICAL DATA:  Osteomyelitis, hand EXAM: MRI OF THE RIGHT HAND WITHOUT CONTRAST TECHNIQUE: Multiplanar, multisequence MR imaging of the right hand was performed. No intravenous contrast was administered. COMPARISON:  Radiograph 02/04/2021, 01/28/2021, 11/24/2020. FINDINGS: Bones/Joint/Cartilage Prior ring finger amputation with residual proximal phalanx. There is bony edema and mildly low T1 signal within the proximal phalanx. Ligaments Intact MCP collateral ligaments. No pulley injury of the non amputated digits. Muscles and Tendons Retracted ring finger flexor tendons due to amputation. No other acute tendon tear. No tenosynovitis. Extensive intramuscular edema throughout the hand, ulnar-sided worse than radial. Soft tissues Extensive soft tissue swelling of the hand. There is no well-defined fluid collection. IMPRESSION: Findings are consistent with osteomyelitis of the residual ring finger proximal phalanx. Extensive soft tissue swelling and intramuscular edema throughout the hand, without well-defined fluid collection. Electronically Signed   By: Maurine Simmering M.D.   On: 02/08/2021 12:51   - pertinent xrays, CT, MRI studies were reviewed and independently interpreted  Positive ROS: All other systems have been reviewed and were otherwise negative with the exception of those mentioned in the HPI and as above.  Physical Exam: General: No acute distress, resting  comfortably Cardiovascular: LUE warm and well perfused, remaining fingers on right hand are warm and well perfused, normal HR Respiratory: No cyanosis, no use of accessory musculature Skin: Warm and dry Neurologic: Diminished sensation at baseline Psychiatric: Patient is at baseline mood and affect  Right hand  Vascular: Thumb/IF/LF/SF warm with intact cap refill.  No ulcers or lesions at tips of fingers.   Wet gangrene of residual ring finger proximal phalanx with non healing tip and purulent/fibrinous material.    No TTP in hand or remaining fingers   Assessment: 50 yo M w/ osteomyelitis involving right ring finger proximal phalanx.  Vascularity of remainder of right hand seems to be improved following the vascular surgery intervention.  No evidence of infection extending proximal  to the ring finger proximal phalanx.   Plan: OR tomorrow for right ring finger ray resection versus MP disarticulation due to OR availability tonight. Continue IV abx NPO in the morning for afternoon procedure Remainder per primary team  Thank you for the consult and the opportunity to see [name], M.D. [facility]  8:15 PM

## 2021-02-08 NOTE — Consult Note (Signed)
ORTHOPAEDIC CONSULTATION  REQUESTING PHYSICIAN: Elodia Florence., *  Chief Complaint: right hand pain  HPI: [name]. is a 50 y.o. male who complains of right hand pain and drainage from his right ring finger partial amputation. The pain is sharp in character. The pain is severe and 10/10. The pain is worse with movement and better with rest.  J History of ESRD (T/Th), HTN, T2DM, HTN, HLD, gastroparesis, CAD who presents to the ED with right 4th digit osteomyelitis.  He's s/p previous partial amputation of R 4th finger. He has had increasing pain and feels unwell.   Past Medical History:  Diagnosis Date   Anxiety    Cellulitis    CHF (congestive heart failure) (HCC)    Chronic diarrhea    Chronic kidney disease    Chronic pain of both ankles    Diabetes mellitus without complication (HCC)    Diabetes mellitus, type II (HCC)    Diabetic neuropathy (HCC)    Frequent falls    Gait instability    GERD (gastroesophageal reflux disease)    Heart palpitations    Hemodialysis patient ([place])    History of kidney stones    HLD (hyperlipidemia)    Hypertension    Insomnia    Nausea and vomiting in adult    recurrent   Past Surgical History:  Procedure Laterality Date   AMPUTATION Left 03/16/2016   Procedure: AMPUTATION DIGIT LEFT HALLUX;  Surgeon: Trula Slade, DPM;  Location: [place];  Service: Podiatry;  Laterality: Left;  can start around 5    ARTHROSCOPIC REPAIR ACL Left    AV FISTULA PLACEMENT Right 05/31/2019   Procedure: Brachiocephalic AV fistula creation;  Surgeon: Algernon Huxley, MD;  Location: [name] ORS;  Service: Vascular;  Laterality: Right;   COLONOSCOPY WITH PROPOFOL N/A 10/28/2015   Procedure: COLONOSCOPY WITH PROPOFOL;  Surgeon: Lollie Sails, MD;  Location: [facility] ENDOSCOPY;  Service: Endoscopy;  Laterality: N/A;   COLONOSCOPY WITH PROPOFOL N/A 10/29/2015   Procedure: COLONOSCOPY WITH PROPOFOL;  Surgeon: Lollie Sails, MD;  Location: [facility]  ENDOSCOPY;  Service: Endoscopy;  Laterality: N/A;   CORONARY/GRAFT ACUTE MI REVASCULARIZATION N/A 02/26/2020   Procedure: Coronary/Graft Acute MI Revascularization;  Surgeon: Jettie Booze, MD;  Location: [employer] [facility];  Service: Cardiovascular;  Laterality: N/A;   DIALYSIS/PERMA CATHETER INSERTION Right 04/26/2019   Perm Cath    DIALYSIS/PERMA CATHETER INSERTION N/A 04/26/2019   Procedure: DIALYSIS/PERMA CATHETER INSERTION;  Surgeon: Algernon Huxley, MD;  Location: [place] [facility];  Service: Cardiovascular;  Laterality: N/A;   DIALYSIS/PERMA CATHETER REMOVAL N/A 08/15/2019   Procedure: DIALYSIS/PERMA CATHETER REMOVAL;  Surgeon: Katha Cabal, MD;  Location: [place] [facility];  Service: Cardiovascular;  Laterality: N/A;   ESOPHAGOGASTRODUODENOSCOPY (EGD) WITH PROPOFOL N/A 12/27/2017   Procedure: ESOPHAGOGASTRODUODENOSCOPY (EGD) WITH PROPOFOL;  Surgeon: Toledo, Benay Pike, MD;  Location: [name] ENDOSCOPY;  Service: Gastroenterology;  Laterality: N/A;   INTRAVASCULAR ULTRASOUND/IVUS N/A 02/26/2020   Procedure: Intravascular Ultrasound/IVUS;  Surgeon: Jettie Booze, MD;  Location: [place] [facility];  Service: Cardiovascular;  Laterality: N/A;   LEFT HEART CATH AND CORONARY ANGIOGRAPHY N/A 02/26/2020   Procedure: LEFT HEART CATH AND CORONARY ANGIOGRAPHY;  Surgeon: Jettie Booze, MD;  Location: [place] [facility];  Service: Cardiovascular;  Laterality: N/A;   PROSTATE SURGERY  2016   TONSILECTOMY/ADENOIDECTOMY WITH MYRINGOTOMY     TONSILLECTOMY     UPPER EXTREMITY ANGIOGRAPHY Right 11/26/2020   Procedure: Upper Extremity  Angiography;  Surgeon: Algernon Huxley, MD;  Location: [place] [facility];  Service: Cardiovascular;  Laterality: Right;   Social History   Socioeconomic History   Marital status: Married    Spouse name: [name]    Number of children: 2   Years of education: Not on file   Highest education level: High school graduate  Occupational History   Occupation:  Disability    Comment: not employed  Tobacco Use   Smoking status: Never   Smokeless tobacco: Never  Vaping Use   Vaping Use: Never used  Substance and Sexual Activity   Alcohol use: No   Drug use: No   Sexual activity: Yes  Other Topics Concern   Not on file  Social History Narrative   Oldest son killed in car crash June 2020.    Social Determinants of Health   Financial Resource Strain: Not on file  Food Insecurity: Not on file  Transportation Needs: No Transportation Needs   Lack of Transportation (Medical): No   Lack of Transportation (Non-Medical): No  Physical Activity: Not on file  Stress: Not on file  Social Connections: Not on file   Family History  Problem Relation Age of Onset   CAD Father    Stroke Father    Diabetes Mellitus II Mother    Kidney failure Mother    Schizophrenia Mother    Allergies  Allergen Reactions   Levofloxacin Swelling    Of face Other reaction(s): swelling   Promethazine Diarrhea and Other (See Comments)    Other reaction(s): Other (See Comments), Other (See Comments) Other reaction(s): Muscle cramps Muscle cramps Cramping Other reaction(s): Muscle cramps Muscle cramps   Other     Other reaction(s): Unknown   Malt Other (See Comments)    Other reaction(s): cramping   Prior to Admission medications   Medication Sig Start Date End Date Taking? Authorizing Provider  apixaban (ELIQUIS) 5 MG TABS tablet Take 5 mg by mouth 2 (two) times daily.   Yes [provider]  acetaminophen (TYLENOL) 325 MG tablet Take 2 tablets (650 mg total) by mouth every 6 (six) hours as needed for mild pain (or Fever >/= 101). 01/30/21   Richarda Osmond, MD  amiodarone (PACERONE) 200 MG tablet Take 1 tablet (200 mg total) by mouth daily. 1 twice daily for 2 weeks, then 1 daily 01/09/21   Eugenie Filler, MD  atorvastatin (LIPITOR) 80 MG tablet TAKE 1 TABLET (80 MG TOTAL) BY MOUTH AT BEDTIME. 02/28/20 02/27/21  Reino Bellis B, NP   azelastine (ASTELIN) 0.1 % nasal spray Place 2 sprays into both nostrils 2 (two) times daily. 09/08/20   [provider]  calcium acetate (PHOSLO) 667 MG capsule Take 3 capsules (2,001 mg total) by mouth 3 (three) times daily with meals. 11/27/20   Sharen Hones, MD  carvedilol (COREG) 25 MG tablet Take 1 tablet (25 mg total) by mouth 2 (two) times daily with a meal. 12/09/20   Johnson, Clanford L, MD  clopidogrel (PLAVIX) 75 MG tablet Take 1 tablet (75 mg total) by mouth daily. 12/09/20   Johnson, Clanford L, MD  FEROSUL 325 (65 Fe) MG tablet Take 325 mg by mouth daily. 09/08/20   [provider]  fluticasone (FLONASE) 50 MCG/ACT nasal spray Place 2 sprays into both nostrils daily. Patient not taking: Reported on 02/04/2021 09/08/20   [provider]  HYDROcodone-acetaminophen (NORCO/VICODIN) 5-325 MG tablet Take 2 tablets by mouth 2 (two) times daily. 01/23/21   [provider]  lidocaine-prilocaine (EMLA) cream Apply 1 application topically Every Tuesday,Thursday,and Saturday with dialysis.  Patient not taking: Reported on 02/04/2021 08/15/19   [provider]  losartan (COZAAR) 25 MG tablet Take 25 mg by mouth daily. Patient not taking: Reported on 02/04/2021 01/21/21   [provider]  nitroGLYCERIN (NITROSTAT) 0.4 MG SL tablet Place 1 tablet (0.4 mg total) under the tongue every 5 (five) minutes as needed for chest pain. 12/09/20 12/09/21  Johnson, Clanford L, MD  prednisoLONE acetate (PRED FORTE) 1 % ophthalmic suspension Place 1 drop into the left eye every 4 (four) hours.    [provider]  tamsulosin (FLOMAX) 0.4 MG CAPS capsule Take 0.4 mg by mouth daily after breakfast.     [provider]   MR HAND RIGHT WO CONTRAST  Result Date: 02/08/2021 CLINICAL DATA:  Osteomyelitis, hand EXAM: MRI OF THE RIGHT HAND WITHOUT CONTRAST TECHNIQUE: Multiplanar, multisequence MR imaging of the right hand was performed. No intravenous  contrast was administered. COMPARISON:  Radiograph 02/04/2021, 01/28/2021, 11/24/2020. FINDINGS: Bones/Joint/Cartilage Prior ring finger amputation with residual proximal phalanx. There is bony edema and mildly low T1 signal within the proximal phalanx. Ligaments Intact MCP collateral ligaments. No pulley injury of the non amputated digits. Muscles and Tendons Retracted ring finger flexor tendons due to amputation. No other acute tendon tear. No tenosynovitis. Extensive intramuscular edema throughout the hand, ulnar-sided worse than radial. Soft tissues Extensive soft tissue swelling of the hand. There is no well-defined fluid collection. IMPRESSION: Findings are consistent with osteomyelitis of the residual ring finger proximal phalanx. Extensive soft tissue swelling and intramuscular edema throughout the hand, without well-defined fluid collection. Electronically Signed   By: Maurine Simmering M.D.   On: 02/08/2021 12:51   PERIPHERAL VASCULAR CATHETERIZATION  Result Date: 02/06/2021 See surgical note for result.   Positive ROS: All other systems have been reviewed and were otherwise negative with the exception of those mentioned in the HPI and as above.  Physical Exam: General: Alert, mild distress Cardiovascular: No pedal edema Respiratory: No cyanosis, no use of accessory musculature GI: No organomegaly, abdomen is soft and non-tender Psychiatric: Patient is competent for consent with normal mood and affect  MUSCULOSKELETAL:   Right hand swelling, sluggish cap refill, foul smelling drainage from ring finger stump. Able to move fingers at MP joint only. Diminished sensation in digits 2-4  Compartments soft.   Assessment: Wet gangrene right 4th digit  Plan: Given the extent of the infection, a revision amputation is warranted. Recommend transfer to care facility with a hand specialist on hospital staff. His wound is re-dressed. The family is agreeable to transfer.     Lovell Sheehan,  MD    02/08/2021 1:00 PM

## 2021-02-08 NOTE — Progress Notes (Signed)
PROGRESS NOTE    [name].  WUJ:[id number] DOB: [birth year] DOA: 02/04/2021 PCP: [name], MD  No chief complaint on file.   Brief Narrative:  50 yo with hx ESRD (T/Th), HTN, T2DM, HTN, HLD, gastroparesis, CAD who presents to the ED with right 4th digit osteomyelitis.  He's s/p previous partial amputation of R 4th finger, but is being admitted now for further evaluation by vascular and orthopedics inpatient.  Hospitalization from 01/29/2021 to 01/30/2021: Per discharge summary, patient presented for right fourth digit wound dehiscence at previous amputation site.  Patient did not have fever, swelling, bleeding.  Vascular was consulted and recommended further amputation to aid with wound healing as the wound is necrotic and will need debridement.  Surgery was scheduled for days out so patient is to be discharged home and will be set up to complete procedure as an outpatient on 02/03/2021.  He was discharged home with Augmentin and doxycycline until after surgery.    Assessment & Plan:   Principal Problem:   Wound infection Active Problems:   Hypertensive chronic kidney disease with stage 1 through stage 4 chronic kidney disease, or unspecified chronic kidney disease   Major depressive disorder, recurrent, unspecified (HCC)   Moderate protein-calorie malnutrition ([place])   Secondary hyperparathyroidism of renal origin ([place])   Reactive depression   Gangrene of finger of right hand ([place])   Wound dehiscence, surgical, initial encounter   Osteomyelitis ([place])   Osteomyelitis of finger of right hand ([place])   ESRD on hemodialysis ([place])   Steal syndrome as complication of dialysis access ([place])   Anemia   Hyperphosphatemia   Hypernatremia   Folate deficiency   Metabolic acidosis   Hyperkalemia   Hypermagnesemia   Mixed hyperlipidemia   Essential hypertension, benign   Coronary artery disease   Blindness of left eye   AF (paroxysmal atrial fibrillation) (HCC)    Gastroparesis   Iron deficiency anemia, unspecified   Osteomyelitis of finger of right hand (HCC) Sed rate 97 CRP 3.6 Fever curve improved, last elevated temp to 100 was 12/17 ~1146 Worsened leukocytosis Will follow up with MRI hand General ortho to see here Negative MRSA PCR Vancomycin/cefepime, add flagyl Blood cultures on admission NG 12/8 cx with citrobacter youngae and proteus mirabilis and mixed anaerobic flora Plain films 12/14 with new destructive changes involving base of ring finger proximal phalanx compatible with acute osteo S/p right upper extremity angiogram with angioplasty of distal right ulnar artery and proximal right radial artery S/p coil embolization of the right brachiocephalic AV fistula as well as large cephalic vein branch and placement of tunneled HD cath Appreciate vascular and orthopedic recommendations.  Hand follow up will occur outpatient given they don't round or operate here.  (general ortho to see today as above) ID consult   Steal syndrome as complication of dialysis access [facility]) S/p coil embolization of right brachiocephalic AV fistula  ESRD on hemodialysis [facility]) Missed Hx Tuesday Per renal - consider L AV fistula vs PD  Folate deficiency replace  Hypernatremia improved  Hyperphosphatemia Per renal with esrd, need for dialysis  Anemia S/p 1 unit pRBC, inappropriate bump Transfuse again and follow retics hypoprolif Labs with AOCD and folate def, low normal B12 - supplement B12 and follow MMA  Hypermagnesemia Electrolyte abnormalities per renal  Hyperkalemia Improved with dialysis  Metabolic acidosis Resolved with HD  Mixed hyperlipidemia statin  Coronary artery disease Statin, coreg  Essential hypertension, benign coreg  AF (paroxysmal atrial fibrillation) (HCC) Continue amiodarone, coreg Discharged  on eliquis on 11/18, but looks like anticoagulation has fallen off (not on 12/9 d/c summary)? Plavix and eliquis were  being held in preparation for surgery.  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 C/o discomfort, will discuss with ophtho -> recommended nonmedicated ointment, abx (which he's already on) and outpatient oculoplastics follow up  Iron deficiency anemia, unspecified Anemia related to esrd, will repeat anemia labs (AOCD, folate def, low normal b12)  Gastroparesis noted   DVT prophylaxis: heparin Code Status: full Family Communication: wife at bedside Disposition:   Status is: Observation  The patient will require care spanning > 2 midnights and should be moved to inpatient because: need for vascular and orthopedic eval and management      Consultants:  Vascular ortho  Procedures:  12/15 PROCEDURE PERFORMED: 1. Ultrasound guidance vascular access to right femoral artery. 2. Catheter placement to right radial artery and right ulnar arteries     from right femoral approach. 3. Thoracic aortogram and selective right upper extremity angiogram     including selective images of the radial and ulnar arteries. 4.  Percutaneous transluminal of distal right ulnar artery with 2.5 mm diameter angioplasty balloon 5.  Percutaneous transluminal angioplasty of the proximal right radial artery with 3 mm diameter angioplasty balloon 5. StarClose closure device right femoral artery.  PROCEDURE: 1.   Right brachiocephalic arteriovenous fistula cannulation under ultrasound guidance 2.   Right arm fistulagram including central venogram 3.   Coil embolization of the right brachiocephalic AV fistula as well as Shianna Bally large cephalic vein branch  PROCEDURE: Ultrasound guidance for vascular access to the left internal jugular vein Fluoroscopic guidance for placement of catheter Placement of Alycea Segoviano 23 cm tip to cuff tunneled hemodialysis catheter via the left internal jugular vein Antimicrobials:  Anti-infectives (From admission, onward)    Start     Dose/Rate Route  Frequency Ordered Stop   02/07/21 0000  ceFAZolin (ANCEF) IVPB 1 g/50 mL premix       Note to Pharmacy: To be given in specials   1 g 100 mL/hr over 30 Minutes Intravenous  Once 02/06/21 1339 02/06/21 1446   02/05/21 1800  ceFEPIme (MAXIPIME) 1 g in sodium chloride 0.9 % 100 mL IVPB        1 g 200 mL/hr over 30 Minutes Intravenous Every 24 hours 02/05/21 1126     02/05/21 1530  metroNIDAZOLE (FLAGYL) tablet 500 mg        500 mg Oral Every 8 hours 02/05/21 1435     02/05/21 1400  vancomycin (VANCOCIN) 1,000 mg in sodium chloride 0.9 % 150 mL IVPB  Status:  Discontinued        1,000 mg 150 mL/hr over 60 Minutes Intravenous Every Dialysis 02/05/21 1126 02/05/21 1306   02/05/21 1400  vancomycin (VANCOCIN) IVPB 1000 mg/200 mL premix        1,000 mg 200 mL/hr over 60 Minutes Intravenous Every T-Th-Sa (Hemodialysis) 02/05/21 1306     02/05/21 0815  ceFAZolin (ANCEF) IVPB 2g/100 mL premix        2 g 200 mL/hr over 30 Minutes Intravenous On call to O.R. 02/05/21 0759 02/05/21 0931   02/05/21 0012  vancomycin variable dose per unstable renal function (pharmacist dosing)  Status:  Discontinued         Does not apply See admin instructions 02/05/21 0013 02/05/21 1126       Subjective: No new complaints  Objective: Vitals:   02/07/21 1645 02/07/21 2038  02/08/21 0346 02/08/21 0816  BP: (!) 199/82 (!) 167/74 (!) 147/68 (!) 164/69  Pulse:  73 64 64  Resp: 11 17 16 18   Temp: 99.5 F (37.5 C) 99.8 F (37.7 C) 98.7 F (37.1 C) 98.2 F (36.8 C)  TempSrc:      SpO2: 100% 92% 98% 95%  Weight:        Intake/Output Summary (Last 24 hours) at 02/08/2021 1012 Last data filed at 02/08/2021 0343 Gross per 24 hour  Intake 798 ml  Output 0 ml  Net 798 ml   Filed Weights   02/05/21 1252 02/07/21 1305  Weight: 93.1 kg 93.6 kg    Examination:  General: No acute distress. Cardiovascular: RRR Lungs: unlabored Abdomen: Soft, nontender, nondistended  Neurological: Alert and oriented 3.  Moves all extremities 4 . Cranial nerves II through XII grossly intact. Skin: Warm and dry. No rashes or lesions. Extremities: foul smelling wound to R hand with ring finger amputation with purulent drainage, hand is swollen      Data Reviewed: I have personally reviewed following labs and imaging studies  CBC: Recent Labs  Lab 02/04/21 1628 02/05/21 0143 02/06/21 0433 02/07/21 0332 02/07/21 1628 02/08/21 0757  WBC 10.8* 12.6* 11.9* 13.1*  --  15.2*  NEUTROABS 7.5  --  8.8* 10.0*  --  12.0*  HGB 7.4* 7.2* 6.5* 6.8* 7.6* 7.4*  HCT 25.5* 23.5* 20.6* 21.8* 23.6* 24.3*  MCV 81.0 78.3* 75.2* 75.4*  --  76.9*  PLT 270 257 216 186  --  366    Basic Metabolic Panel: Recent Labs  Lab 02/04/21 1628 02/04/21 2340 02/05/21 0143 02/06/21 0433 02/07/21 0332 02/08/21 0757  NA 149*  --  146* 141 140 134*  K 5.3*  --  5.8* 4.1 4.8 4.1  CL 121*  --  120* 108 106 98  CO2 10*  --  10* 22 21* 25  GLUCOSE 70  --  85 97 81 98  BUN 79*  --  82* 49* 65* 40*  CREATININE 17.99*  --  17.53* 10.72* 12.24* 8.78*  CALCIUM 8.5*  --  8.3* 8.1* 8.1* 8.2*  MG  --  2.5*  --  1.8 1.9  --   PHOS  --  11.5*  --  6.2* 8.8*  --     GFR: Estimated Creatinine Clearance: 11.8 mL/min (Gearldine Looney) (by C-G formula based on SCr of 8.78 mg/dL (H)).  Liver Function Tests: Recent Labs  Lab 02/04/21 1628 02/06/21 0433 02/07/21 0332 02/08/21 0757  AST 11* 11* 10* 8*  ALT 14 11 6  <5  ALKPHOS 64 57 52 50  BILITOT 0.2* 0.8 0.8 0.7  PROT 7.0 5.9* 6.0* 5.7*  ALBUMIN 3.0* 2.5* 2.5* 2.2*    CBG: No results for input(s): GLUCAP in the last 168 hours.    Recent Results (from the past 240 hour(s))  Culture, blood (routine x 2)     Status: None (Preliminary result)   Collection Time: 02/04/21  4:28 PM   Specimen: BLOOD  Result Value Ref Range Status   Specimen Description   Final    BLOOD BLOOD LEFT HAND Performed at [facility], [address], [place], [place] [address]    Special Requests   Final     BOTTLES DRAWN AEROBIC AND ANAEROBIC Blood Culture results may not be optimal due to an excessive volume of blood received in culture bottles Performed at [facility], [address] [place], [name] [address]    Culture   Final  NO GROWTH 4 DAYS Performed at [facility], [place] [address] [place], [place] [address]    Report Status PENDING  Incomplete  Culture, blood (routine x 2)     Status: None (Preliminary result)   Collection Time: 02/04/21  4:30 PM   Specimen: BLOOD  Result Value Ref Range Status   Specimen Description BLOOD BLOOD LEFT FOREARM  Final   Special Requests   Final    BOTTLES DRAWN AEROBIC AND ANAEROBIC Blood Culture adequate volume   Culture   Final    NO GROWTH 3 DAYS Performed at [facility], [address] [place], [place] [address]    Report Status PENDING  Incomplete  Resp Panel by RT-PCR (Flu Dnasia Gauna&B, Covid) Nasopharyngeal Swab     Status: None   Collection Time: 02/04/21  4:30 PM   Specimen: Nasopharyngeal Swab; Nasopharyngeal(NP) swabs in vial transport medium  Result Value Ref Range Status   SARS Coronavirus 2 by RT PCR NEGATIVE NEGATIVE Final    Comment: (NOTE) SARS-CoV-2 target nucleic acids are NOT DETECTED.  The SARS-CoV-2 RNA is generally detectable in upper respiratory specimens during the acute phase of infection. The lowest concentration of SARS-CoV-2 viral copies this assay can detect is 138 copies/mL. Aysen Shieh negative result does not preclude SARS-Cov-2 infection and should not be used as the sole basis for treatment or other patient management decisions. Micala Saltsman negative result may occur with  improper specimen collection/handling, submission of specimen other than nasopharyngeal swab, presence of viral mutation(s) within the areas targeted by this assay, and inadequate number of viral copies(<138 copies/mL). Karem Tomaso negative result must be combined with clinical observations, patient history, and epidemiological information. The expected result is  Negative.  Fact Sheet for Patients:  EntrepreneurPulse.com.au  Fact Sheet for Healthcare Providers:  IncredibleEmployment.be  This test is no t yet approved or cleared by the [place] FDA and  has been authorized for detection and/or diagnosis of SARS-CoV-2 by FDA under an Emergency Use Authorization (EUA). This EUA will remain  in effect (meaning this test can be used) for the duration of the COVID-19 declaration under Section 564(b)(1) of the Act, 21 U.S.C.section 360bbb-3(b)(1), unless the authorization is terminated  or revoked sooner.       Influenza Timber Lucarelli by PCR NEGATIVE NEGATIVE Final   Influenza B by PCR NEGATIVE NEGATIVE Final    Comment: (NOTE) The Xpert Xpress SARS-CoV-2/FLU/RSV plus assay is intended as an aid in the diagnosis of influenza from Nasopharyngeal swab specimens and should not be used as Gurbani Figge sole basis for treatment. Nasal washings and aspirates are unacceptable for Xpert Xpress SARS-CoV-2/FLU/RSV testing.  Fact Sheet for Patients: EntrepreneurPulse.com.au  Fact Sheet for Healthcare Providers: IncredibleEmployment.be  This test is not yet approved or cleared by the [place] FDA and has been authorized for detection and/or diagnosis of SARS-CoV-2 by FDA under an Emergency Use Authorization (EUA). This EUA will remain in effect (meaning this test can be used) for the duration of the COVID-19 declaration under Section 564(b)(1) of the Act, 21 U.S.C. section 360bbb-3(b)(1), unless the authorization is terminated or revoked.  Performed at [facility], [address], [place], [place] [address]   CULTURE, BLOOD (ROUTINE X 2) w Reflex to ID Panel     Status: None (Preliminary result)   Collection Time: 02/04/21 11:40 PM   Specimen: BLOOD  Result Value Ref Range Status   Specimen Description BLOOD LEFT ASSIST CONTROL  Final   Special Requests   Final    BOTTLES DRAWN  AEROBIC AND  ANAEROBIC Blood Culture adequate volume   Culture   Final    NO GROWTH 3 DAYS Performed at [facility], [address] [place], [place] [address]    Report Status PENDING  Incomplete  CULTURE, BLOOD (ROUTINE X 2) w Reflex to ID Panel     Status: None (Preliminary result)   Collection Time: 02/04/21 11:40 PM   Specimen: BLOOD  Result Value Ref Range Status   Specimen Description BLOOD LEFT HAND  Final   Special Requests   Final    BOTTLES DRAWN AEROBIC AND ANAEROBIC Blood Culture adequate volume   Culture   Final    NO GROWTH 3 DAYS Performed at [facility], [address], [place], [place] [address]    Report Status PENDING  Incomplete  MRSA Next Gen by PCR, Nasal     Status: None   Collection Time: 02/05/21  1:24 AM   Specimen: Nasal Mucosa; Nasal Swab  Result Value Ref Range Status   MRSA by PCR Next Gen NOT DETECTED NOT DETECTED Final    Comment: (NOTE) The GeneXpert MRSA Assay (FDA approved for NASAL specimens only), is one component of [place] comprehensive MRSA colonization surveillance program. It is not intended to diagnose MRSA infection nor to guide or monitor treatment for MRSA infections. Test performance is not FDA approved in patients less than 57 years old. Performed at [facility], [address], [place], [place] [address]          Radiology Studies: PERIPHERAL VASCULAR CATHETERIZATION  Result Date: 02/06/2021 See surgical note for result.       Scheduled Meds:  amiodarone  200 mg Oral Daily   artificial tears   Left Eye QID   atorvastatin  80 mg Oral QHS   calcium acetate  2,001 mg Oral TID WC   carvedilol  25 mg Oral BID WC   folic acid  1 mg Oral Daily   heparin injection (subcutaneous)  5,000 Units Subcutaneous Q8H   methocarbamol  500 mg Oral Once   metroNIDAZOLE  500 mg Oral Q8H   vitamin B-12  1,000 mcg Oral Daily   Continuous Infusions:  ceFEPime (MAXIPIME) IV Stopped (02/07/21 1832)   vancomycin 1,000  mg (02/07/21 1514)     LOS: 4 days    Time spent: over 30 min    Fayrene Helper, MD [facility]   To contact the attending provider between 7A-7P or the covering provider during after hours 7P-7A, please log into the web site [email or web address] and access using universal [place] password for that web site. If you do not have the password, please call the hospital operator.  02/08/2021, 10:12 AM

## 2021-02-08 NOTE — Discharge Summary (Signed)
Physician Discharge Summary  [name]. IRJ:[id number] DOB: 03/03/1970 DOA: 02/04/2021  PCP: [name] Schwalbe, MD  Admit date: 02/04/2021 Discharge date: 02/08/2021  Time spent: 40 minutes  Recommendations for Follow-up at [facility]:  Call Dr. Thomasene Macdonald surgery on arrival Continue broad spectrum abx Consider ID consult Follow MMA --> continue B12 and folate supplementation Plavix and eliquis are on hold in preparation for surgery Needs outpatient oculoplastics follow up   Discharge Diagnoses:  Principal Problem:   Wound infection Active Problems:   Hypertensive chronic kidney disease with stage 1 through stage 4 chronic kidney disease, or unspecified chronic kidney disease   Major depressive disorder, recurrent, unspecified (HCC)   Moderate protein-calorie malnutrition ([name])   Secondary hyperparathyroidism of renal origin ([place])   Reactive depression   Gangrene of finger of right hand ([place])   Wound dehiscence, surgical, initial encounter   Osteomyelitis ([place])   Osteomyelitis of finger of right hand ([place])   ESRD on hemodialysis ([place])   Steal syndrome as complication of dialysis access ([place])   Anemia   Hyperphosphatemia   Hypernatremia   Folate deficiency   Metabolic acidosis   Hyperkalemia   Hypermagnesemia   Mixed hyperlipidemia   Essential hypertension, benign   Coronary artery disease   Blindness of left eye   AF (paroxysmal atrial fibrillation) (HCC)   Gastroparesis   Iron deficiency anemia, unspecified   Discharge Condition: stable  Diet recommendation: NPO until eval  Filed Weights   02/05/21 1252 02/07/21 1305  Weight: 93.1 kg 93.6 kg    History of present illness:  50 yo with hx ESRD (T/Th), HTN, T2DM, HTN, HLD, gastroparesis, CAD, 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  He's s/p previous partial amputation of R 4th finger.  On 12/1 per care everywhere orthopedic note he had right  ring finger dehisced wound with wet gangrene from amputation site.  Orthopedics desired vascular c/s due to concern for ability to heal wound.  He was admitted from 12/8 to 12/9 and seen by vascular.  He was discharged on augmentin and doxycycline with Pierson Vantol plan for angiogram and attempt revascularization outpatient.  He was readmitted on 12/14 due to worsening pain and swelling with [name] plan for vascular evaluation.  Imaging on 12/14 showed new destructive changes c/w acute osteomyelitis.  Vascular saw on 12/15 and performed angioplasty of distal ulnar artery and proximal right radial artery.  He was noted to have steal syndrome of the right arm and per my discussion with vascular they discussed options with patient including amputation of hand vs ligation of fistula and attempting to be more conservative with amputation.  Patient is now s/p coil embolization of the right brachiocephalic AV fistula as well as large cephalic vein branch.  Plan initially was for outpatient hand surgery, but he's had recurrent temperatures and now elevated white blood cell count.  General orthopedics was consulted today and recommended transfer to care facility with hand specialist on staff.  I discussed the case with [name] who will [facility]   Hospital Course:  Osteomyelitis of finger of right hand (HCC) Sed rate 97 CRP 3.6 Fever curve improved, last elevated temp to 100 was 12/17 ~1146 Worsened leukocytosis today MRI hand with osteomyelitis of residual ring finger proximal phalanx, extensive soft tissue swelling and intramuscular edema General ortho recommended transfer to teritary care facility with hand specialist - I discussed with [name] who will  see patient at [facility] Negative MRSA PCR Vancomycin/cefepime, add flagyl Blood cultures on admission NG 12/8 cx with citrobacter youngae and proteus mirabilis and mixed anaerobic flora Plain films 12/14 with new destructive changes  involving base of ring finger proximal phalanx compatible with acute osteo S/p right upper extremity angiogram with angioplasty of distal right ulnar artery and proximal right radial artery He's now s/p coil embolization of the right brachiocephalic AV fistula as well as large cephalic vein branch and placement of tunneled HD cath after discussion with vascular regarding his options Appreciate vascular and orthopedic recommendations.   Would recommend ID consult as well   Steal syndrome as complication of dialysis access [facility]) S/p coil embolization of right brachiocephalic AV fistula  ESRD on hemodialysis [facility]) Missed Hx Tuesday Per renal - consider L AV fistula vs PD  Folate deficiency replace  Hypernatremia improved  Hyperphosphatemia Per renal with esrd, need for dialysis  Anemia S/p 2 unit pRBC, inappropriate bump Follow, stable today retics hypoprolif Labs with AOCD and folate def, low normal B12 - supplement B12 and follow MMA  Hypermagnesemia Electrolyte abnormalities per renal  Hyperkalemia Improved with dialysis  Metabolic acidosis Resolved with HD  Mixed hyperlipidemia statin  Coronary artery disease Statin, coreg  Essential hypertension, benign coreg  AF (paroxysmal atrial fibrillation) ([place]) Continue amiodarone, coreg Discharged on eliquis on 11/18, but looks like anticoagulation has fallen off (not on 12/9 d/c summary)? Plavix and eliquis were being held in preparation for surgery.  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   Right brachiocephalic arteriovenous fistula cannulation under ultrasound  guidance 2.   Right arm fistulagram including central venogram 3.   Coil embolization of the right brachiocephalic AV fistula as well as Maeby Vankleeck large cephalic vein branch  PROCEDURE: Ultrasound guidance for vascular access to the left internal jugular vein Fluoroscopic guidance for placement of catheter Placement of Gardenia Witter 23 cm tip to cuff tunneled hemodialysis catheter via the left internal jugular vein  PROCEDURE PERFORMED: 1. Ultrasound guidance vascular access to right femoral artery. 2. Catheter placement to right radial artery and right ulnar arteries     from right femoral approach. 3. Thoracic aortogram and selective right upper extremity angiogram     including selective images of the radial and ulnar arteries. 4.  Percutaneous transluminal of distal right ulnar artery with 2.5 mm diameter angioplasty balloon 5.  Percutaneous transluminal angioplasty of the proximal right radial artery with 3 mm diameter angioplasty balloon 5. StarClose closure device right femoral artery.    Consultations: Orthopedics vascular  Discharge Exam: Vitals:   02/08/21 0346 02/08/21 0816  BP: (!) 147/68 (!) 164/69  Pulse: 64 64  Resp: 16 18  Temp: 98.7 F (37.1 C) 98.2 F (36.8 C)  SpO2: 98% 95%   See progress note  Discharge Instructions   Discharge Instructions     Call MD for:  difficulty breathing, headache or visual disturbances   Complete by: As directed    Call MD for:  extreme fatigue   Complete by: As directed    Call MD for:  hives   Complete by: As directed    Call MD for:  persistant dizziness or light-headedness   Complete  by: As directed    Call MD for:  persistant nausea and vomiting   Complete by: As directed    Call MD for:  redness, tenderness, or signs of infection (pain, swelling, redness, odor or green/yellow discharge around incision site)   Complete by: As directed    Call MD for:  severe uncontrolled pain   Complete by: As directed    Call MD for:  temperature  >100.4   Complete by: As directed    Diet - low sodium heart healthy   Complete by: As directed    Discharge instructions   Complete by: As directed    You were seen by vascular surgery and have had revascularization of your right arm.  They've also ligated the fistula on your right arm.   You have an increasing white blood cell count and low grade temperatures.  We're going to transfer you to [facility] for evaluation by hand surgery.  You required transfusion for anemia.  They'll continue to follow this at [facility].  You have folate deficiency and low normal B12 levels.  You have pending labs, but we're supplementing this.  Return for new, recurrent, or worsening symptoms.  Please ask your PCP to request records from this hospitalization so they know what was done and what the next steps will be.   Discharge wound care:   Complete by: As directed    Per orthopedics   Increase activity slowly   Complete by: As directed       Allergies as of 02/08/2021       Reactions   Levofloxacin Swelling   Of face Other reaction(s): swelling   Promethazine Diarrhea, Other (See Comments)   Other reaction(s): Other (See Comments), Other (See Comments) Other reaction(s): Muscle cramps Muscle cramps Cramping Other reaction(s): Muscle cramps Muscle cramps   Other    Other reaction(s): Unknown   Malt Other (See Comments)   Other reaction(s): cramping        Medication List     STOP taking these medications    amoxicillin-clavulanate 500-125 MG tablet Commonly known as: Augmentin   doxycycline 100 MG tablet Commonly known as: ADOXA   losartan 25 MG tablet Commonly known as: COZAAR       TAKE these medications    acetaminophen 325 MG tablet Commonly known as: TYLENOL Take 2 tablets (650 mg total) by mouth every 6 (six) hours as needed for mild pain (or Fever >/= 101).   amiodarone 200 MG tablet Commonly known as: Pacerone Take 1 tablet (200 mg total) by mouth daily.  1 twice daily for 2 weeks, then 1 daily   apixaban 5 MG Tabs tablet Commonly known as: ELIQUIS Take 5 mg by mouth 2 (two) times daily.   artificial tears Oint ophthalmic ointment Commonly known as: LACRILUBE Place into the left eye 4 (four) times daily.   atorvastatin 80 MG tablet Commonly known as: LIPITOR TAKE 1 TABLET (80 MG TOTAL) BY MOUTH AT BEDTIME.   azelastine 0.1 % nasal spray Commonly known as: ASTELIN Place 2 sprays into both nostrils 2 (two) times daily.   calcium acetate 667 MG capsule Commonly known as: PHOSLO Take 3 capsules (2,001 mg total) by mouth 3 (three) times daily with meals.   carvedilol 25 MG tablet Commonly known as: COREG Take 1 tablet (25 mg total) by mouth 2 (two) times daily with Gearl Baratta meal.   clopidogrel 75 MG tablet Commonly known as: PLAVIX Take 1 tablet (75 mg total) by mouth  daily.   cyanocobalamin 1000 MCG tablet Take 1 tablet (1,000 mcg total) by mouth daily. Start taking on: February 09, 2021   FeroSul 325 (65 FE) MG tablet Generic drug: ferrous sulfate Take 325 mg by mouth daily.   fluticasone 50 MCG/ACT nasal spray Commonly known as: FLONASE Place 2 sprays into both nostrils daily.   folic acid 1 MG tablet Commonly known as: FOLVITE Take 1 tablet (1 mg total) by mouth daily. Start taking on: February 09, 2021   HYDROcodone-acetaminophen 5-325 MG tablet Commonly known as: NORCO/VICODIN Take 2 tablets by mouth 2 (two) times daily.   lidocaine-prilocaine cream Commonly known as: EMLA Apply 1 application topically Every Tuesday,Thursday,and Saturday with dialysis.   nitroGLYCERIN 0.4 MG SL tablet Commonly known as: NITROSTAT Place 1 tablet (0.4 mg total) under the tongue every 5 (five) minutes as needed for chest pain.   prednisoLONE acetate 1 % ophthalmic suspension Commonly known as: PRED FORTE Place 1 drop into the left eye every 4 (four) hours.   tamsulosin 0.4 MG Caps capsule Commonly known as: FLOMAX Take 0.4 mg by  mouth daily after breakfast.               Discharge Care Instructions  (From admission, onward)           Start     Ordered   02/08/21 0000  Discharge wound care:       Comments: Per orthopedics   02/08/21 1350           Allergies  Allergen Reactions   Levofloxacin Swelling    Of face Other reaction(s): swelling   Promethazine Diarrhea and Other (See Comments)    Other reaction(s): Other (See Comments), Other (See Comments) Other reaction(s): Muscle cramps Muscle cramps Cramping Other reaction(s): Muscle cramps Muscle cramps   Other     Other reaction(s): Unknown   Malt Other (See Comments)    Other reaction(s): cramping      The results of significant diagnostics from this hospitalization (including imaging, microbiology, ancillary and laboratory) are listed below for reference.    Significant Diagnostic Studies: MR HAND RIGHT WO CONTRAST  Result Date: 02/08/2021 CLINICAL DATA:  Osteomyelitis, hand EXAM: MRI OF THE RIGHT HAND WITHOUT CONTRAST TECHNIQUE: Multiplanar, multisequence MR imaging of the right hand was performed. No intravenous contrast was administered. COMPARISON:  Radiograph 02/04/2021, 01/28/2021, 11/24/2020. FINDINGS: Bones/Joint/Cartilage Prior ring finger amputation with residual proximal phalanx. There is bony edema and mildly low T1 signal within the proximal phalanx. Ligaments Intact MCP collateral ligaments. No pulley injury of the non amputated digits. Muscles and Tendons Retracted ring finger flexor tendons due to amputation. No other acute tendon tear. No tenosynovitis. Extensive intramuscular edema throughout the hand, ulnar-sided worse than radial. Soft tissues Extensive soft tissue swelling of the hand. There is no well-defined fluid collection. IMPRESSION: Findings are consistent with osteomyelitis of the residual ring finger proximal phalanx. Extensive soft tissue swelling and intramuscular edema throughout the hand, without  well-defined fluid collection. Electronically Signed   By: Maurine Simmering M.D.   On: 02/08/2021 12:51   PERIPHERAL VASCULAR CATHETERIZATION  Result Date: 02/06/2021 See surgical note for result.  PERIPHERAL VASCULAR CATHETERIZATION  Result Date: 02/05/2021 See surgical note for result.  DG Hand Complete Right  Result Date: 02/04/2021 CLINICAL DATA:  Right hand wound, fever.  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 New  destructive changes involving the base of the ring finger proximal phalanx compatible with acute osteomyelitis. No fracture or dislocation. Osseous structures are demineralized. Severe small vessel atherosclerotic calcifications. Prominent soft tissue swelling of the hand. IMPRESSION: New destructive changes involving the base of the ring finger proximal phalanx compatible with acute osteomyelitis. Electronically Signed   By: Davina Poke D.O.   On: 02/04/2021 16:49   DG Hand Complete Right  Result Date: 01/28/2021 CLINICAL DATA:  Pt comes into the ED via POV c/o infection in his right hand. 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 Sharp margins. No cortical erosion or destruction. There is no evidence of fracture or dislocation. There is no evidence of severe arthropathy or aggressive appearing focal bone abnormality. Soft tissues are unremarkable. Vascular calcifications. 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 Electronically Signed   By: Iven Finn M.D.   On: 01/28/2021 18:55    Microbiology: Recent Results (from the past 240 hour(s))  Culture, blood (routine x 2)     Status:  None (Preliminary result)   Collection Time: 02/04/21  4:28 PM   Specimen: BLOOD  Result Value Ref Range Status   Specimen Description   Final    BLOOD BLOOD LEFT HAND Performed at [facility], [address] [place], [place] [address]    Special Requests   Final    BOTTLES DRAWN AEROBIC AND ANAEROBIC Blood Culture results may not be optimal due to an excessive volume of blood received in culture bottles Performed at [facility], [address] [place], [place] [address]    Culture   Final    NO GROWTH 4 DAYS Performed at [facility], [address] [place], [place] [address]    Report Status PENDING  Incomplete  Culture, blood (routine x 2)     Status: None (Preliminary result)   Collection Time: 02/04/21  4:30 PM   Specimen: BLOOD  Result Value Ref Range Status   Specimen Description BLOOD BLOOD LEFT FOREARM  Final   Special Requests   Final    BOTTLES DRAWN AEROBIC AND ANAEROBIC Blood Culture adequate volume   Culture   Final    NO GROWTH 3 DAYS Performed at [facility], [address] [place], [place] [address]    Report Status PENDING  Incomplete  Resp Panel by RT-PCR (Flu Briggett Tuccillo&B, Covid) Nasopharyngeal Swab     Status: None   Collection Time: 02/04/21  4:30 PM   Specimen: Nasopharyngeal Swab; Nasopharyngeal(NP) swabs in vial transport medium  Result Value Ref Range Status   SARS Coronavirus 2 by RT PCR NEGATIVE NEGATIVE Final    Comment: (NOTE) SARS-CoV-2 target nucleic acids are NOT DETECTED.  The SARS-CoV-2 RNA is generally detectable in upper respiratory specimens during the acute phase of infection. The lowest concentration of SARS-CoV-2 viral copies this assay can detect is 138 copies/mL. Totiana Everson negative result does not preclude SARS-Cov-2 infection and should not be used as the sole basis for treatment or other patient management decisions. Atlantis Delong negative result may occur with  improper specimen collection/handling, submission of specimen other than  nasopharyngeal swab, presence of viral mutation(s) within the areas targeted by this assay, and inadequate number of viral copies(<138 copies/mL). Maribeth Jiles negative result must be combined with clinical observations, patient  history, and epidemiological information. The expected result is Negative.  Fact Sheet for Patients:  EntrepreneurPulse.com.au  Fact Sheet for Healthcare Providers:  IncredibleEmployment.be  This test is no t yet approved or cleared by the [place] FDA and  has been authorized for detection and/or diagnosis of SARS-CoV-2 by FDA under an Emergency Use Authorization (EUA). This EUA will remain  in effect (meaning this test can be used) for the duration of the COVID-19 declaration under Section 564(b)(1) of the Act, 21 U.S.C.section 360bbb-3(b)(1), unless the authorization is terminated  or revoked sooner.       Influenza Shanielle Correll by PCR NEGATIVE NEGATIVE Final   Influenza B by PCR NEGATIVE NEGATIVE Final    Comment: (NOTE) The Xpert Xpress SARS-CoV-2/FLU/RSV plus assay is intended as an aid in the diagnosis of influenza from Nasopharyngeal swab specimens and should not be used as Charlye Spare sole basis for treatment. Nasal washings and aspirates are unacceptable for Xpert Xpress SARS-CoV-2/FLU/RSV testing.  Fact Sheet for Patients: EntrepreneurPulse.com.au  Fact Sheet for Healthcare Providers: IncredibleEmployment.be  This test is not yet approved or cleared by the [place] FDA and has been authorized for detection and/or diagnosis of SARS-CoV-2 by FDA under an Emergency Use Authorization (EUA). This EUA will remain in effect (meaning this test can be used) for the duration of the COVID-19 declaration under Section 564(b)(1) of the Act, 21 U.S.C. section 360bbb-3(b)(1), unless the authorization is terminated or revoked.  Performed at [facility], [address], [place], [place] [address]    CULTURE, BLOOD (ROUTINE X 2) w Reflex to ID Panel     Status: None (Preliminary result)   Collection Time: 02/04/21 11:40 PM   Specimen: BLOOD  Result Value Ref Range Status   Specimen Description BLOOD LEFT ASSIST CONTROL  Final   Special Requests   Final    BOTTLES DRAWN AEROBIC AND ANAEROBIC Blood Culture adequate volume   Culture   Final    NO GROWTH 3 DAYS Performed at [facility], [address], [name], [place] [address]    Report Status PENDING  Incomplete  CULTURE, BLOOD (ROUTINE X 2) w Reflex to ID Panel     Status: None (Preliminary result)   Collection Time: 02/04/21 11:40 PM   Specimen: BLOOD  Result Value Ref Range Status   Specimen Description BLOOD LEFT HAND  Final   Special Requests   Final    BOTTLES DRAWN AEROBIC AND ANAEROBIC Blood Culture adequate volume   Culture   Final    NO GROWTH 3 DAYS Performed at [facility], [address] [place], [place] [address]    Report Status PENDING  Incomplete  MRSA Next Gen by PCR, Nasal     Status: None   Collection Time: 02/05/21  1:24 AM   Specimen: Nasal Mucosa; Nasal Swab  Result Value Ref Range Status   MRSA by PCR Next Gen NOT DETECTED NOT DETECTED Final    Comment: (NOTE) The GeneXpert MRSA Assay (FDA approved for NASAL specimens only), is one component of Sonnia Strong comprehensive MRSA colonization surveillance program. It is not intended to diagnose MRSA infection nor to guide or monitor treatment for MRSA infections. Test performance is not FDA approved in patients less than 97 years old. Performed at [facility], [address] [place], [place] [address]      Labs: Basic Metabolic Panel: Recent Labs  Lab 02/04/21 1628 02/04/21 2340 02/05/21 0143 02/06/21 0433 02/07/21 0332 02/08/21 0757  NA 149*  --  146*  141 140 134*  K 5.3*  --  5.8* 4.1 4.8 4.1  CL 121*  --  120* 108 106 98  CO2 10*  --  10* 22 21* 25  GLUCOSE 70  --  85 97 81 98  BUN 79*  --  82* 49* 65* 40*   CREATININE 17.99*  --  17.53* 10.72* 12.24* 8.78*  CALCIUM 8.5*  --  8.3* 8.1* 8.1* 8.2*  MG  --  2.5*  --  1.8 1.9  --   PHOS  --  11.5*  --  6.2* 8.8*  --    Liver Function Tests: Recent Labs  Lab 02/04/21 1628 02/06/21 0433 02/07/21 0332 02/08/21 0757  AST 11* 11* 10* 8*  ALT 14 11 6  <5  ALKPHOS 64 57 52 50  BILITOT 0.2* 0.8 0.8 0.7  PROT 7.0 5.9* 6.0* 5.7*  ALBUMIN 3.0* 2.5* 2.5* 2.2*   No results for input(s): LIPASE, AMYLASE in the last 168 hours. No results for input(s): AMMONIA in the last 168 hours. CBC: Recent Labs  Lab 02/04/21 1628 02/05/21 0143 02/06/21 0433 02/07/21 0332 02/07/21 1628 02/08/21 0757  WBC 10.8* 12.6* 11.9* 13.1*  --  15.2*  NEUTROABS 7.5  --  8.8* 10.0*  --  12.0*  HGB 7.4* 7.2* 6.5* 6.8* 7.6* 7.4*  HCT 25.5* 23.5* 20.6* 21.8* 23.6* 24.3*  MCV 81.0 78.3* 75.2* 75.4*  --  76.9*  PLT 270 257 216 186  --  161   Cardiac Enzymes: No results for input(s): CKTOTAL, CKMB, CKMBINDEX, TROPONINI in the last 168 hours. BNP: BNP (last 3 results) Recent Labs    12/08/20 1004  BNP 482.0*    ProBNP (last 3 results) No results for input(s): PROBNP in the last 8760 hours.  CBG: No results for input(s): GLUCAP in the last 168 hours.     Signed:  Fayrene Helper MD.  [facility] 02/08/2021, 2:24 PM

## 2021-02-08 NOTE — Evaluation (Addendum)
Physical Therapy Evaluation Patient Details Name: [name]. MRN: [id number] DOB: [birth year] Today's Date: 02/08/2021  History of Present Illness  Pt is a 50 y/o M admitted on 02/04/21 after presenting with R 4th digit osteomyelitis after recent hospitalization 12/8-12/9. Imaging from 12/14 revealed new destructive changes involving base of ring finger proximal phalanx compatible with acute osteo. Pt is s/p RUE angiogram with angioplasty of distal right ulnar artery & proximal right radial artery & s/p coil embolization of the right brachiocephalic AV fistula as well as large cephalic vein branch and placement of tunneled HD cath. PMH: ESRD (HD on T/Th), HTN, DM2, HTN, HLD, gastroparesis, CAD  Clinical Impression  Pt seen for PT evaluation with wife present & providing most of PLOF information. Pt has began experiencing increased weakness & R hip pain over the last 3-4 weeks, "crawling" at times, 2 falls in the past 6 months, and using SPC for mobility. Pt also with frequent episodes of "blacking out", at times even while driving so pt no longer drives. On this date pt is agreeable to mobility & initiates semi-fowler to sitting EOB when pt achieving sitting with LLE on floor, RLE elevated on bed & PT providing cuing to place BLE feet on floor. Pt requires extra time & while sitting up begins to close eyes & list R with decreased responsiveness. PT supports pt at shoulders & assists pt to supine with pt then speaking to PT & reporting he became "dizzy". Nurse called to room, vitals: SpO2 100% on room air, HR 66 bpm, BP 153/69 mmHg (MAP 94) in LUE. Pt asking about standing with PT deferring at this time. Pt left in bed to rest with wife present & with PT suggesting nursing obtain orthostatic vital signs. Educated pt & wife on recommendation of STR upon d/c.      Recommendations for follow up therapy are one component of a multi-disciplinary discharge planning process, led by the attending  physician.  Recommendations may be updated based on patient status, additional functional criteria and insurance authorization.  Follow Up Recommendations Skilled nursing-short term rehab (<3 hours/day)    Assistance Recommended at Discharge Frequent or constant Supervision/Assistance  Functional Status Assessment Patient has had a recent decline in their functional status and demonstrates the ability to make significant improvements in function in a reasonable and predictable amount of time.  Equipment Recommendations   (TBD in next venue)    Recommendations for Other Services   OT consult    Precautions / Restrictions Precautions Precautions: Fall Precaution Comments: LUE dialysis fistula Restrictions Weight Bearing Restrictions: No      Mobility  Bed Mobility Overal bed mobility: Needs Assistance Bed Mobility: Supine to Sit;Sit to Supine     Supine to sit: Supervision;HOB elevated (use of bed rails, HOB significantly elevated) Sit to supine: Min assist;HOB elevated        Transfers Overall transfer level:  (deferred at this time)                      Ambulation/Gait                  Stairs            Wheelchair Mobility    Modified Rankin (Stroke Patients Only)       Balance Overall balance assessment: Needs assistance   Sitting balance-Leahy Scale: Fair Sitting balance - Comments: supervision when sitting up in bed with RLE on bed, LLE on floor  Pertinent Vitals/Pain Pain Assessment: Faces Faces Pain Scale: Hurts little more Pain Location: pt doesn't verbalize/specify Pain Descriptors / Indicators: Grimacing Pain Intervention(s): Limited activity within patient's tolerance;Monitored during session    [place] expects to be discharged to:: Private residence Living Arrangements: Spouse/significant other Available Help at Discharge: Family;Available  PRN/intermittently Type of Home: House Home Access: Ramped entrance       Home Layout: Multi-level;Able to live on main level with bedroom/bathroom Home Equipment: Rolling Walker (2 wheels);Cane - single point      Prior Function               Mobility Comments: [name] reports pt has had 2 falls in the past 6 months but has gotten progressively weaker over the past 3-4 weeks. Pt also limited by R hip pain. Pt with hx of blacking out (even occurring while driving so pt quit driving) & has gotten so weak he "crawls" at times.       Hand Dominance        Extremity/Trunk Assessment   Upper Extremity Assessment Upper Extremity Assessment:  (Pt with open wound to finger on R hand, RUE edema)    Lower Extremity Assessment Lower Extremity Assessment: Generalized weakness       Communication   Communication: No difficulties  Cognition Arousal/Alertness: Awake/alert Behavior During Therapy: Flat affect Overall Cognitive Status: Within Functional Limits for tasks assessed                                 General Comments: Pt with hx of L eye blindness & L eye closed throughout session (wife voices need to f/u with MD re: this due to concerns for infection).        General Comments      Exercises     Assessment/Plan    PT Assessment Patient needs continued PT services  PT Problem List Decreased strength;Decreased mobility;Decreased safety awareness;Decreased activity tolerance;Decreased skin integrity;Cardiopulmonary status limiting activity;Decreased balance;Decreased knowledge of use of DME;Pain       PT Treatment Interventions DME instruction;Therapeutic exercise;Gait training;Balance training;Stair training;Neuromuscular re-education;Manual techniques;Functional mobility training;Patient/family education;Therapeutic activities;Modalities    PT Goals (Current goals can be found in the Care Plan section)  Acute Rehab PT Goals Patient Stated Goal:  get better PT Goal Formulation: With patient/family Time For Goal Achievement: 02/22/21 Potential to Achieve Goals: Good    Frequency Min 2X/week   Barriers to discharge Decreased caregiver support      Co-evaluation               AM-PAC PT "6 Clicks" Mobility  Outcome Measure Help needed turning from your back to your side while in a flat bed without using bedrails?: None Help needed moving from lying on your back to sitting on the side of a flat bed without using bedrails?: A Little Help needed moving to and from a bed to a chair (including a wheelchair)?: A Lot Help needed standing up from a chair using your arms (e.g., wheelchair or bedside chair)?: A Lot Help needed to walk in hospital room?: A Lot Help needed climbing 3-5 steps with a railing? : Total 6 Click Score: 14    End of Session   Activity Tolerance: Treatment limited secondary to medical complications (Comment) Patient left: in bed;with call bell/phone within reach;with family/visitor present;with bed alarm set Nurse Communication:  (events of session) PT Visit Diagnosis: Muscle weakness (generalized) (M62.81);Unsteadiness on feet (R26.81);Difficulty  in walking, not elsewhere classified (R26.2);History of falling (Z91.81)    Time: 4827-0786 PT Time Calculation (min) (ACUTE ONLY): 16 min   Charges:   PT Evaluation $PT Eval Moderate Complexity: [place], PT, DPT 02/08/21, 12:09 PM   Waunita Schooner 02/08/2021, 10:35 AM

## 2021-02-08 NOTE — Progress Notes (Signed)
OT Cancellation Note  Patient Details Name: [name]. MRN: [id number] DOB: 07/26/1970   Cancelled Treatment:    Reason Eval/Treat Not Completed: Other (comment). Order received, chart reviewed. [name] POC has been updated, with plans for the pt to transfer to [facility], for treatment by hand specialist there. Will sign off at this time, with pt to receive OT evaluation as appropriate following transfer to [facility].   [name] Lobo, PhD, MS, OTR/L 02/08/21, 3:15 PM

## 2021-02-08 NOTE — Plan of Care (Signed)

## 2021-02-08 NOTE — H&P (Signed)
History and Physical    [name]. OEU:[id number] DOB: 01-May-1970 DOA: 02/08/2021  PCP: [name], MD   Patient coming from: [facility]  Chief Complaint: Osteomyelitis right hand, steal syndrome  HPI: [name]. is a 50 y.o. 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  He's s/p previous partial amputation of R 4th finger.  That surgery was complicated by dehiscence of the wound with poor wound healing and he was readmitted to [facility] from December to December 9 to be seen by vascular surgery.  At that time he was discharged on oral antibiotics with Augmentin and doxycycline was seen to have an outpatient angiogram and attempt for revascularization.  He had worsening pain and swelling and was readmitted on December 14 to [facility].  He was found to have new destructive changes with acute osteomyelitis of the right ring finger and was seen by vascular service and had angioplasty of the distal ulnar artery and proximal right radial artery.  He was also found to have steal syndrome in the right arm and had coil embolization of the right brachiocephalic AV fistula and large right cephalic vein branch.  He then developed fever with elevated white blood cell count and was transferred to [facility] for evaluation by hand surgery.  Patient was seen by Dr. Tempie Donning upon arrival and plan is for amputation in the OR the right ring finger tomorrow afternoon.  Swelling of his right hand and pain in the stump of the ring finger but no other pain he reports.  He reports sensation in his hand is improved since the vascular procedures were performed.  He has been afebrile for the past 24 hours. Been on broad-spectrum antibiotic with vancomycin and cefepime and add Flagyl added.  Will need ID consult in the morning.  Pain control was  provided.   Review of Systems:  General: Denies fever, chills in past 24 hours. Denies weight loss, night sweats.  Denies dizziness.  Denies change in appetite HENT: Denies head trauma, headache, denies change in hearing, tinnitus.  Denies nasal congestion or bleeding.  Denies sore throat, sores in mouth.  Denies difficulty swallowing Eyes: Denies blurry vision, pain in eye, drainage.  Denies discoloration of eyes. Neck: Denies pain.  Denies swelling.  Denies pain with movement. Cardiovascular: Denies chest pain, palpitations.  Denies edema.  Denies orthopnea Respiratory: Denies shortness of breath, cough.  Denies wheezing.  Denies sputum production Gastrointestinal: Denies abdominal pain, swelling.  Denies nausea, vomiting, diarrhea.  Denies melena.  Denies hematemesis. Musculoskeletal: Denies limitation of movement.  Denies deformity or swelling.  Genitourinary: Denies pelvic pain.  Denies urinary frequency or hesitancy.  Denies dysuria.  Skin: Denies rash.  Denies petechiae, purpura, ecchymosis. Neurological: Denies syncope. Denies seizure activity. Denies slurred speech, drooping face. Denies visual change. Psychiatric: Denies depression, anxiety.  Denies hallucinations.  Past Medical History:  Diagnosis Date   Anxiety    Cellulitis    CHF (congestive heart failure) (HCC)    Chronic diarrhea    Chronic kidney disease    Chronic pain of both ankles    Diabetes mellitus without complication (HCC)    Diabetes mellitus, type II (HCC)    Diabetic neuropathy (HCC)    Frequent falls    Gait instability    GERD (gastroesophageal reflux disease)    Heart palpitations    Hemodialysis  patient [facility])    History of kidney stones    HLD (hyperlipidemia)    Hypertension    Insomnia    Nausea and vomiting in adult    recurrent    Past Surgical History:  Procedure Laterality Date   AMPUTATION Left 03/16/2016   Procedure: AMPUTATION DIGIT LEFT HALLUX;  Surgeon: Trula Slade, DPM;   Location: [facility];  Service: Podiatry;  Laterality: Left;  can start around 3    ARTHROSCOPIC REPAIR ACL Left    AV FISTULA PLACEMENT Right 05/31/2019   Procedure: Brachiocephalic AV fistula creation;  Surgeon: Algernon Huxley, MD;  Location: [name] ORS;  Service: Vascular;  Laterality: Right;   COLONOSCOPY WITH PROPOFOL N/A 10/28/2015   Procedure: COLONOSCOPY WITH PROPOFOL;  Surgeon: Lollie Sails, MD;  Location: [facility] ENDOSCOPY;  Service: Endoscopy;  Laterality: N/A;   COLONOSCOPY WITH PROPOFOL N/A 10/29/2015   Procedure: COLONOSCOPY WITH PROPOFOL;  Surgeon: Lollie Sails, MD;  Location: [facility] ENDOSCOPY;  Service: Endoscopy;  Laterality: N/A;   CORONARY/GRAFT ACUTE MI REVASCULARIZATION N/A 02/26/2020   Procedure: Coronary/Graft Acute MI Revascularization;  Surgeon: Jettie Booze, MD;  Location: [place] CV LAB;  Service: Cardiovascular;  Laterality: N/A;   DIALYSIS/PERMA CATHETER INSERTION Right 04/26/2019   Perm Cath    DIALYSIS/PERMA CATHETER INSERTION N/A 04/26/2019   Procedure: DIALYSIS/PERMA CATHETER INSERTION;  Surgeon: Algernon Huxley, MD;  Location: [place] CV LAB;  Service: Cardiovascular;  Laterality: N/A;   DIALYSIS/PERMA CATHETER REMOVAL N/A 08/15/2019   Procedure: DIALYSIS/PERMA CATHETER REMOVAL;  Surgeon: Katha Cabal, MD;  Location: [place] CV LAB;  Service: Cardiovascular;  Laterality: N/A;   ESOPHAGOGASTRODUODENOSCOPY (EGD) WITH PROPOFOL N/A 12/27/2017   Procedure: ESOPHAGOGASTRODUODENOSCOPY (EGD) WITH PROPOFOL;  Surgeon: [place], Benay Pike, MD;  Location: [name] ENDOSCOPY;  Service: Gastroenterology;  Laterality: N/A;   INTRAVASCULAR ULTRASOUND/IVUS N/A 02/26/2020   Procedure: Intravascular Ultrasound/IVUS;  Surgeon: Jettie Booze, MD;  Location: [place] CV LAB;  Service: Cardiovascular;  Laterality: N/A;   LEFT HEART CATH AND CORONARY ANGIOGRAPHY N/A 02/26/2020   Procedure: LEFT HEART CATH AND CORONARY ANGIOGRAPHY;  Surgeon: Jettie Booze, MD;   Location: [place] CV LAB;  Service: Cardiovascular;  Laterality: N/A;   PROSTATE SURGERY  2016   TONSILECTOMY/ADENOIDECTOMY WITH MYRINGOTOMY     TONSILLECTOMY     UPPER EXTREMITY ANGIOGRAPHY Right 11/26/2020   Procedure: Upper Extremity Angiography;  Surgeon: Algernon Huxley, MD;  Location: [place] CV LAB;  Service: Cardiovascular;  Laterality: Right;    Social History  reports that he has never smoked. He has never used smokeless tobacco. He reports that he does not drink alcohol and does not use drugs.  Allergies  Allergen Reactions   Levofloxacin Swelling    Of face Other reaction(s): swelling   Promethazine Diarrhea and Other (See Comments)    Other reaction(s): Other (See Comments), Other (See Comments) Other reaction(s): Muscle cramps Muscle cramps Cramping Other reaction(s): Muscle cramps Muscle cramps   Other     Other reaction(s): Unknown   Malt Other (See Comments)    Other reaction(s): cramping    Family History  Problem Relation Age of Onset   CAD Father    Stroke Father    Diabetes Mellitus II Mother    Kidney failure Mother    Schizophrenia Mother      Prior to Admission medications   Medication Sig Start Date End Date Taking? Authorizing Provider  acetaminophen (TYLENOL) 325 MG tablet Take 2 tablets (650 mg total)  by mouth every 6 (six) hours as needed for mild pain (or Fever >/= 101). 01/30/21   Richarda Osmond, MD  amiodarone (PACERONE) 200 MG tablet Take 1 tablet (200 mg total) by mouth daily. 1 twice daily for 2 weeks, then 1 daily 01/09/21   Eugenie Filler, MD  apixaban (ELIQUIS) 5 MG TABS tablet Take 5 mg by mouth 2 (two) times daily.    [provider]  artificial tears (LACRILUBE) OINT ophthalmic ointment Place into the left eye 4 (four) times daily. 02/08/21   Elodia Florence., MD  atorvastatin (LIPITOR) 80 MG tablet TAKE 1 TABLET (80 MG TOTAL) BY MOUTH AT BEDTIME. 02/28/20 02/27/21  Reino Bellis B, NP  azelastine  (ASTELIN) 0.1 % nasal spray Place 2 sprays into both nostrils 2 (two) times daily. 09/08/20   [provider]  calcium acetate (PHOSLO) 667 MG capsule Take 3 capsules (2,001 mg total) by mouth 3 (three) times daily with meals. 11/27/20   Sharen Hones, MD  carvedilol (COREG) 25 MG tablet Take 1 tablet (25 mg total) by mouth 2 (two) times daily with a meal. 12/09/20   Johnson, Clanford L, MD  clopidogrel (PLAVIX) 75 MG tablet Take 1 tablet (75 mg total) by mouth daily. 12/09/20   Johnson, Clanford L, MD  FEROSUL 325 (65 Fe) MG tablet Take 325 mg by mouth daily. 09/08/20   [provider]  fluticasone (FLONASE) 50 MCG/ACT nasal spray Place 2 sprays into both nostrils daily. Patient not taking: Reported on 02/04/2021 09/08/20   [provider]  folic acid (FOLVITE) 1 MG tablet Take 1 tablet (1 mg total) by mouth daily. 02/09/21 03/11/21  Elodia Florence., MD  HYDROcodone-acetaminophen (NORCO/VICODIN) 5-325 MG tablet Take 2 tablets by mouth 2 (two) times daily. 01/23/21   [provider]  lidocaine-prilocaine (EMLA) cream Apply 1 application topically Every Tuesday,Thursday,and Saturday with dialysis.  Patient not taking: Reported on 02/04/2021 08/15/19   [provider]  nitroGLYCERIN (NITROSTAT) 0.4 MG SL tablet Place 1 tablet (0.4 mg total) under the tongue every 5 (five) minutes as needed for chest pain. 12/09/20 12/09/21  Johnson, Clanford L, MD  prednisoLONE acetate (PRED FORTE) 1 % ophthalmic suspension Place 1 drop into the left eye every 4 (four) hours.    [provider]  tamsulosin (FLOMAX) 0.4 MG CAPS capsule Take 0.4 mg by mouth daily after breakfast.     [provider]  vitamin B-12 1000 MCG tablet Take 1 tablet (1,000 mcg total) by mouth daily. 02/09/21 03/11/21  Elodia Florence., MD    Physical Exam:  Constitutional: NAD, calm, comfortable General: WDWN, Alert and oriented x3.  Eyes: EOMI, PERRL, conjunctivae pale  pink.  Sclera nonicteric HENT:  [place]/AT, external ears normal.  Nares patent without epistasis.  Mucous membranes are moist Neck: Soft, normal range of motion, supple, no masses, Trachea midline Respiratory: clear to auscultation bilaterally, no wheezing, no crackles. Normal respiratory effort. No accessory muscle use.  Cardiovascular: Regular rate and rhythm, no murmurs / rubs / gallops. Abdomen: Soft, no tenderness, nondistended, no rebound or guarding.  No masses palpated. Bowel sounds normoactive Musculoskeletal: FROM. Right hand with dressing in place with edema of hand. no cyanosis. Normal muscle tone.  Skin: Warm, dry, intact no rashes, lesions, ulcers. No induration Neurologic: CN 2-12 grossly intact.  Normal speech. Diminished sensation in distal extremities which is chronic he states   Psychiatric: Normal judgment and insight.  Normal mood.    Labs  on Admission: I have personally reviewed following labs and imaging studies  CBC: Recent Labs  Lab 02/04/21 1628 02/05/21 0143 02/06/21 0433 02/07/21 0332 02/07/21 1628 02/08/21 0757  WBC 10.8* 12.6* 11.9* 13.1*  --  15.2*  NEUTROABS 7.5  --  8.8* 10.0*  --  12.0*  HGB 7.4* 7.2* 6.5* 6.8* 7.6* 7.4*  HCT 25.5* 23.5* 20.6* 21.8* 23.6* 24.3*  MCV 81.0 78.3* 75.2* 75.4*  --  76.9*  PLT 270 257 216 186  --  417    Basic Metabolic Panel: Recent Labs  Lab 02/04/21 1628 02/04/21 2340 02/05/21 0143 02/06/21 0433 02/07/21 0332 02/08/21 0757  NA 149*  --  146* 141 140 134*  K 5.3*  --  5.8* 4.1 4.8 4.1  CL 121*  --  120* 108 106 98  CO2 10*  --  10* 22 21* 25  GLUCOSE 70  --  85 97 81 98  BUN 79*  --  82* 49* 65* 40*  CREATININE 17.99*  --  17.53* 10.72* 12.24* 8.78*  CALCIUM 8.5*  --  8.3* 8.1* 8.1* 8.2*  MG  --  2.5*  --  1.8 1.9  --   PHOS  --  11.5*  --  6.2* 8.8*  --     GFR: Estimated Creatinine Clearance: 11.8 mL/min (A) (by C-G formula based on SCr of 8.78 mg/dL (H)).  Liver Function Tests: Recent Labs  Lab  02/04/21 1628 02/06/21 0433 02/07/21 0332 02/08/21 0757  AST 11* 11* 10* 8*  ALT 14 11 6  <5  ALKPHOS 64 57 52 50  BILITOT 0.2* 0.8 0.8 0.7  PROT 7.0 5.9* 6.0* 5.7*  ALBUMIN 3.0* 2.5* 2.5* 2.2*    Urine analysis:    Component Value Date/Time   COLORURINE YELLOW (A) 11/07/2017 1855   APPEARANCEUR CLOUDY (A) 11/07/2017 1855   LABSPEC 1.016 11/07/2017 1855   PHURINE 5.0 11/07/2017 1855   GLUCOSEU >=500 (A) 11/07/2017 1855   HGBUR MODERATE (A) 11/07/2017 1855   BILIRUBINUR NEGATIVE 11/07/2017 1855   KETONESUR NEGATIVE 11/07/2017 1855   PROTEINUR >=300 (A) 11/07/2017 1855   NITRITE NEGATIVE 11/07/2017 1855   LEUKOCYTESUR NEGATIVE 11/07/2017 1855    Radiological Exams on Admission: MR HAND RIGHT WO CONTRAST  Result Date: 02/08/2021 CLINICAL DATA:  Osteomyelitis, hand EXAM: MRI OF THE RIGHT HAND WITHOUT CONTRAST TECHNIQUE: Multiplanar, multisequence MR imaging of the right hand was performed. No intravenous contrast was administered. COMPARISON:  Radiograph 02/04/2021, 01/28/2021, 11/24/2020. FINDINGS: Bones/Joint/Cartilage Prior ring finger amputation with residual proximal phalanx. There is bony edema and mildly low T1 signal within the proximal phalanx. Ligaments Intact MCP collateral ligaments. No pulley injury of the non amputated digits. Muscles and Tendons Retracted ring finger flexor tendons due to amputation. No other acute tendon tear. No tenosynovitis. Extensive intramuscular edema throughout the hand, ulnar-sided worse than radial. Soft tissues Extensive soft tissue swelling of the hand. There is no well-defined fluid collection. IMPRESSION: Findings are consistent with osteomyelitis of the residual ring finger proximal phalanx. Extensive soft tissue swelling and intramuscular edema throughout the hand, without well-defined fluid collection. Electronically Signed   By: Maurine Simmering M.D.   On: 02/08/2021 12:51     Assessment/Plan Principal Problem:    Osteomyelitis of finger  of right hand Mr. Linck is admitted to floor after transfer from [facility] He had MRI showing osteomyelitis of the right hand in the residual ring finger with extensive soft tissue swelling and intermuscular edema.  Patient been advised by hand surgery and  plan is to go to the operating room for amputation of the ring finger tomorrow afternoon at 330. Continue broad-spectrum antibiotics with vancomycin cefepime and Flagyl that he was on previously Will need ID consult in the morning  Active Problems:   Steal syndrome as complication of dialysis access S/p coil embolization of the right brachiocephalic AV fistula by IR at [facility]    ESRD on hemodialysis  Patient had dialysis on December 17 at [facility].  Nephrology has been consulted to follow while in the hospital and.  Patient is scheduled for next dialysis session on Tuesday, December 20.    Anemia due to chronic kidney disease, on chronic dialysis Chronic.  Patient is status post transfusion of packed red blood cells at [facility].  Monitor hemoglobin hematocrit    PAF Stable. Pt has been on eliquis which is being held for surgery. Plan to resume eliquis after surgery    Chronic diastolic heart failure  Stable.    Essential hypertension, benign Stable.  Monitor blood pressure    Folate deficiency Being supplemented   DVT prophylaxis: SCDs overnight in anticipation of surgery tomorrow. After surgery eliquis can be resumed  Code Status:   Full Code  Family Communication:  Diagnosis and plan discussed with patient and his wife who is at bedside.  Verbalized understanding agree with plan.  Further recommendation follow as clinical indicated Disposition Plan:   Patient is from:  [facility]  Anticipated DC to:  Home  Anticipated DC date:  Home  Consults called:  Hand surgery-Dr. Tempie Donning;  nephrology-notified Dr. Jonnie Finner Admission status:  Inpatient  Yevonne Aline Shemia Bevel MD [facility]  How to contact the [facility] Attending or Consulting provider [place] or covering provider during after hours [place], for this patient?   Check the care team in [facility] and look for a) attending/consulting [name] provider listed and b) the [facility] team listed Log into [email or web address] and use [place]'s universal password to access. If you do not have the password, please contact the hospital operator. Locate the [facility] provider you are looking for under [facility] and page to a number that you can be directly reached. If you still have difficulty reaching the provider, please page the [facility] (Director on Call) for the Hospitalists listed on amion for assistance.  02/08/2021, 8:33 PM

## 2021-02-09 ENCOUNTER — Inpatient Hospital Stay (HOSPITAL_COMMUNITY): Payer: Medicare Other | Admitting: Anesthesiology

## 2021-02-09 ENCOUNTER — Inpatient Hospital Stay (HOSPITAL_COMMUNITY): Admission: AD | Disposition: A | Discharge status: Home / Self Care | Payer: Self-pay | Attending: Internal Medicine

## 2021-02-09 ENCOUNTER — Encounter: Payer: Self-pay | Admitting: Vascular Surgery

## 2021-02-09 DIAGNOSIS — Z992 Dependence on renal dialysis: Secondary | ICD-10-CM

## 2021-02-09 DIAGNOSIS — D631 Anemia in chronic kidney disease: Secondary | ICD-10-CM

## 2021-02-09 DIAGNOSIS — I10 Essential (primary) hypertension: Secondary | ICD-10-CM

## 2021-02-09 DIAGNOSIS — N186 End stage renal disease: Secondary | ICD-10-CM

## 2021-02-09 DIAGNOSIS — I5032 Chronic diastolic (congestive) heart failure: Secondary | ICD-10-CM

## 2021-02-09 DIAGNOSIS — M86141 Other acute osteomyelitis, right hand: Secondary | ICD-10-CM

## 2021-02-09 HISTORY — PX: AMPUTATION: SHX166

## 2021-02-09 LAB — COMPREHENSIVE METABOLIC PANEL
ALT: <5 U/L (ref 0–44)
AST: 9 U/L — ABNORMAL LOW (ref 15–41)
Albumin: 2.1 g/dL — ABNORMAL LOW (ref 3.5–5.0)
Alkaline Phosphatase: 55 U/L (ref 38–126)
Anion gap: 12 (ref 5–15)
BUN: 48 mg/dL — ABNORMAL HIGH (ref 6–20)
CO2: 22 mmol/L (ref 22–32)
Calcium: 8.1 mg/dL — ABNORMAL LOW (ref 8.9–10.3)
Chloride: 100 mmol/L (ref 98–111)
Creatinine, Ser: 10.4 mg/dL — ABNORMAL HIGH (ref 0.61–1.24)
GFR, Estimated: 5 mL/min — ABNORMAL LOW (ref >60)
Glucose, Bld: 91 mg/dL (ref 70–99)
Potassium: 3.9 mmol/L (ref 3.5–5.1)
Sodium: 134 mmol/L — ABNORMAL LOW (ref 135–145)
Total Bilirubin: 0.8 mg/dL (ref 0.3–1.2)
Total Protein: 5.4 g/dL — ABNORMAL LOW (ref 6.5–8.1)

## 2021-02-09 LAB — GLUCOSE, CAPILLARY: Glucose-Capillary: 74 mg/dL (ref 70–99)

## 2021-02-09 LAB — SURGICAL PCR SCREEN
MRSA, PCR: NEGATIVE
Staphylococcus aureus: NEGATIVE

## 2021-02-09 LAB — CBC
HCT: 23.5 % — ABNORMAL LOW (ref 39.0–52.0)
Hemoglobin: 7.2 g/dL — ABNORMAL LOW (ref 13.0–17.0)
MCH: 24.6 pg — ABNORMAL LOW (ref 26.0–34.0)
MCHC: 30.6 g/dL (ref 30.0–36.0)
MCV: 80.2 fL (ref 80.0–100.0)
Platelets: 129 10*3/uL — ABNORMAL LOW (ref 150–400)
RBC: 2.93 MIL/uL — ABNORMAL LOW (ref 4.22–5.81)
RDW: 17.9 % — ABNORMAL HIGH (ref 11.5–15.5)
WBC: 14.4 10*3/uL — ABNORMAL HIGH (ref 4.0–10.5)
nRBC: 0 % (ref 0.0–0.2)

## 2021-02-09 LAB — CULTURE, BLOOD (ROUTINE X 2)
Culture: NO GROWTH
Culture: NO GROWTH
Special Requests: ADEQUATE

## 2021-02-09 LAB — TYPE AND SCREEN
ABO/RH(D): A POS
Antibody Screen: NEGATIVE

## 2021-02-09 SURGERY — AMPUTATION DIGIT
Anesthesia: General | Site: Ring Finger | Laterality: Right

## 2021-02-09 MED ORDER — CHLORHEXIDINE GLUCONATE 0.12 % MT SOLN
OROMUCOSAL | Status: AC
  Administered 2021-02-09: 16:00:00 15 mL via OROMUCOSAL
  Filled 2021-02-09: qty 15

## 2021-02-09 MED ORDER — CHLORHEXIDINE GLUCONATE 4 % EX LIQD: 60.0000 mL | Freq: Once | CUTANEOUS | Status: DC

## 2021-02-09 MED ORDER — LIDOCAINE 2% (20 MG/ML) 5 ML SYRINGE
INTRAMUSCULAR | Status: DC | PRN
  Administered 2021-02-09: 60 mg via INTRAVENOUS

## 2021-02-09 MED ORDER — AMLODIPINE BESYLATE 5 MG PO TABS
5.0000 mg | ORAL_TABLET | Freq: Every day | ORAL | Status: DC
  Administered 2021-02-09: 14:00:00 5 mg via ORAL
  Filled 2021-02-09: qty 1

## 2021-02-09 MED ORDER — 0.9 % SODIUM CHLORIDE (POUR BTL) OPTIME
TOPICAL | Status: DC | PRN
  Administered 2021-02-09 (×2): 1000 mL

## 2021-02-09 MED ORDER — ACETAMINOPHEN 10 MG/ML IV SOLN
INTRAVENOUS | Status: AC
  Filled 2021-02-09: qty 100

## 2021-02-09 MED ORDER — CHLORHEXIDINE GLUCONATE 0.12 % MT SOLN: 15.0000 mL | Freq: Once | OROMUCOSAL | Status: AC

## 2021-02-09 MED ORDER — PHENYLEPHRINE HCL-NACL 20-0.9 MG/250ML-% IV SOLN
INTRAVENOUS | Status: DC | PRN
  Administered 2021-02-09: 40 ug/min via INTRAVENOUS

## 2021-02-09 MED ORDER — PROPOFOL 10 MG/ML IV BOLUS
INTRAVENOUS | Status: AC
  Filled 2021-02-09: qty 20

## 2021-02-09 MED ORDER — CYANOCOBALAMIN 1000 MCG/ML IJ SOLN
1000.0000 ug | Freq: Every day | INTRAMUSCULAR | Status: DC
  Administered 2021-02-09 – 2021-02-10 (×2): 1000 ug via INTRAMUSCULAR
  Filled 2021-02-09 (×2): qty 1

## 2021-02-09 MED ORDER — FENTANYL CITRATE (PF) 100 MCG/2ML IJ SOLN
25.0000 ug | INTRAMUSCULAR | Status: DC | PRN
  Administered 2021-02-09: 18:00:00 50 ug via INTRAVENOUS

## 2021-02-09 MED ORDER — PROPOFOL 10 MG/ML IV BOLUS
INTRAVENOUS | Status: DC | PRN
  Administered 2021-02-09: 200 mg via INTRAVENOUS

## 2021-02-09 MED ORDER — BUPIVACAINE HCL (PF) 0.25 % IJ SOLN
INTRAMUSCULAR | Status: AC
  Filled 2021-02-09: qty 30

## 2021-02-09 MED ORDER — FENTANYL CITRATE (PF) 250 MCG/5ML IJ SOLN
INTRAMUSCULAR | Status: DC | PRN
  Administered 2021-02-09: 50 ug via INTRAVENOUS

## 2021-02-09 MED ORDER — POVIDONE-IODINE 10 % EX SWAB
2.0000 "application " | Freq: Once | CUTANEOUS | Status: AC
  Administered 2021-02-09: 2 via TOPICAL

## 2021-02-09 MED ORDER — BACITRACIN ZINC 500 UNIT/GM EX OINT
TOPICAL_OINTMENT | CUTANEOUS | Status: AC
  Filled 2021-02-09: qty 28.35

## 2021-02-09 MED ORDER — FENTANYL CITRATE (PF) 250 MCG/5ML IJ SOLN
INTRAMUSCULAR | Status: AC
  Filled 2021-02-09: qty 5

## 2021-02-09 MED ORDER — FENTANYL CITRATE (PF) 100 MCG/2ML IJ SOLN
INTRAMUSCULAR | Status: AC
  Filled 2021-02-09: qty 2

## 2021-02-09 MED ORDER — MEPERIDINE HCL 25 MG/ML IJ SOLN: 6.2500 mg | INTRAMUSCULAR | Status: DC | PRN

## 2021-02-09 MED ORDER — ACETAMINOPHEN 10 MG/ML IV SOLN
1000.0000 mg | Freq: Once | INTRAVENOUS | Status: DC | PRN
  Administered 2021-02-09: 19:00:00 1000 mg via INTRAVENOUS

## 2021-02-09 MED ORDER — ORAL CARE MOUTH RINSE: 15.0000 mL | Freq: Once | OROMUCOSAL | Status: AC

## 2021-02-09 MED ORDER — SODIUM CHLORIDE 0.9 % IV SOLN: INTRAVENOUS | Status: DC

## 2021-02-09 SURGICAL SUPPLY — 63 items
BAG COUNTER SPONGE SURGICOUNT (BAG) ×2 IMPLANT
BAG SPNG CNTER NS LX DISP (BAG) ×1
BAG SURGICOUNT SPONGE COUNTING (BAG) ×1
BLADE AVERAGE 25MMX9MM (BLADE)
BLADE AVERAGE 25X9 (BLADE) IMPLANT
BLADE MINI RND TIP GREEN BEAV (BLADE) IMPLANT
BNDG CMPR 75X41 PLY HI ABS (GAUZE/BANDAGES/DRESSINGS)
BNDG CMPR 9X4 STRL LF SNTH (GAUZE/BANDAGES/DRESSINGS) ×1
BNDG COHESIVE 1X5 TAN STRL LF (GAUZE/BANDAGES/DRESSINGS) IMPLANT
BNDG COHESIVE 6X5 TAN STRL LF (GAUZE/BANDAGES/DRESSINGS) IMPLANT
BNDG ELASTIC 2X5.8 VLCR STR LF (GAUZE/BANDAGES/DRESSINGS) ×2 IMPLANT
BNDG ELASTIC 4X5.8 VLCR STR LF (GAUZE/BANDAGES/DRESSINGS) ×2 IMPLANT
BNDG ESMARK 4X9 LF (GAUZE/BANDAGES/DRESSINGS) ×3 IMPLANT
BNDG GAUZE ELAST 4 BULKY (GAUZE/BANDAGES/DRESSINGS) IMPLANT
BNDG STRETCH 4X75 STRL LF (GAUZE/BANDAGES/DRESSINGS) IMPLANT
CORD BIPOLAR FORCEPS 12FT (ELECTRODE) ×3 IMPLANT
COVER SURGICAL LIGHT HANDLE (MISCELLANEOUS) ×3 IMPLANT
CUFF TOURN SGL QUICK 18X4 (TOURNIQUET CUFF) IMPLANT
CUFF TOURN SGL QUICK 24 (TOURNIQUET CUFF)
CUFF TOURN SGL QUICK 34 (TOURNIQUET CUFF)
CUFF TRNQT CYL 24X4X16.5-23 (TOURNIQUET CUFF) IMPLANT
CUFF TRNQT CYL 34X4.125X (TOURNIQUET CUFF) IMPLANT
DRAPE U-SHAPE 47X51 STRL (DRAPES) ×3 IMPLANT
DRSG XEROFORM 1X8 (GAUZE/BANDAGES/DRESSINGS) ×2 IMPLANT
DURAPREP 26ML APPLICATOR (WOUND CARE) ×3 IMPLANT
ELECT REM PT RETURN 9FT ADLT (ELECTROSURGICAL) ×3
ELECTRODE REM PT RTRN 9FT ADLT (ELECTROSURGICAL) ×1 IMPLANT
GAUZE SPONGE 2X2 8PLY STRL LF (GAUZE/BANDAGES/DRESSINGS) IMPLANT
GAUZE SPONGE 4X4 12PLY STRL (GAUZE/BANDAGES/DRESSINGS) ×2 IMPLANT
GAUZE XEROFORM 1X8 LF (GAUZE/BANDAGES/DRESSINGS) IMPLANT
GLOVE SRG 8 PF TXTR STRL LF DI (GLOVE) ×1 IMPLANT
GLOVE SURG ENC MOIS LTX SZ8 (GLOVE) ×3 IMPLANT
GLOVE SURG ORTHO LTX SZ7.5 (GLOVE) ×3 IMPLANT
GLOVE SURG UNDER POLY LF SZ8 (GLOVE) ×3
GOWN STRL REUS W/ TWL LRG LVL3 (GOWN DISPOSABLE) ×1 IMPLANT
GOWN STRL REUS W/ TWL XL LVL3 (GOWN DISPOSABLE) ×4 IMPLANT
GOWN STRL REUS W/TWL LRG LVL3 (GOWN DISPOSABLE) ×3
GOWN STRL REUS W/TWL XL LVL3 (GOWN DISPOSABLE) ×12
KIT BASIN OR (CUSTOM PROCEDURE TRAY) ×3 IMPLANT
KIT TURNOVER KIT B (KITS) ×3 IMPLANT
MANIFOLD NEPTUNE II (INSTRUMENTS) ×3 IMPLANT
NDL HYPO 25GX1X1/2 BEV (NEEDLE) IMPLANT
NEEDLE HYPO 25GX1X1/2 BEV (NEEDLE) IMPLANT
NS IRRIG 1000ML POUR BTL (IV SOLUTION) ×3 IMPLANT
PACK ORTHO EXTREMITY (CUSTOM PROCEDURE TRAY) ×3 IMPLANT
PAD ARMBOARD 7.5X6 YLW CONV (MISCELLANEOUS) ×6 IMPLANT
PAD CAST 4YDX4 CTTN HI CHSV (CAST SUPPLIES) IMPLANT
PADDING CAST ABS 4INX4YD NS (CAST SUPPLIES) ×2
PADDING CAST ABS COTTON 4X4 ST (CAST SUPPLIES) IMPLANT
PADDING CAST COTTON 4X4 STRL (CAST SUPPLIES)
SPECIMEN JAR SMALL (MISCELLANEOUS) ×3 IMPLANT
SPONGE GAUZE 2X2 STER 10/PKG (GAUZE/BANDAGES/DRESSINGS)
SUCTION FRAZIER HANDLE 10FR (MISCELLANEOUS)
SUCTION TUBE FRAZIER 10FR DISP (MISCELLANEOUS) IMPLANT
SUT ETHILON 2 0 FS 18 (SUTURE) IMPLANT
SUT ETHILON 3 0 PS 1 (SUTURE) ×4 IMPLANT
SUT VIC AB 2-0 FS1 27 (SUTURE) IMPLANT
SYR CONTROL 10ML LL (SYRINGE) IMPLANT
TOWEL GREEN STERILE (TOWEL DISPOSABLE) ×3 IMPLANT
TOWEL GREEN STERILE FF (TOWEL DISPOSABLE) ×3 IMPLANT
TUBE CONNECTING 12'X1/4 (SUCTIONS)
TUBE CONNECTING 12X1/4 (SUCTIONS) IMPLANT
WATER STERILE IRR 1000ML POUR (IV SOLUTION) ×3 IMPLANT

## 2021-02-09 NOTE — Brief Op Note (Signed)
02/09/2021  6:13 PM  PATIENT:  [name].  50 y.o. male  PRE-OPERATIVE DIAGNOSIS:  Right ring finger osteomyelitis  POST-OPERATIVE DIAGNOSIS:  Right ring finger osteomyelitis  PROCEDURE:  Procedure(s): AMPUTATION DIGIT (Right)  SURGEON:  Surgeon(s) and Role:    * Sherilyn Cooter, MD - Primary  PHYSICIAN ASSISTANT:   ASSISTANTS: none   ANESTHESIA:   general  EBL:  20 mL   BLOOD ADMINISTERED:none  DRAINS: none   LOCAL MEDICATIONS USED:  NONE  SPECIMEN:  Swab and tissue for culture  DISPOSITION OF SPECIMEN:  Microbiology  COUNTS:  YES  TOURNIQUET:  * No tourniquets in log *  DICTATION: .Dragon Dictation  PLAN OF CARE: Admit to inpatient   PATIENT DISPOSITION:  PACU - hemodynamically stable.   Delay start of Pharmacological VTE agent (>24hrs) due to surgical blood loss or risk of bleeding: not applicable

## 2021-02-09 NOTE — Anesthesia Preprocedure Evaluation (Addendum)
Anesthesia Evaluation  Patient identified by MRN, date of birth, ID band Patient awake    Reviewed: Allergy & Precautions, NPO status , Patient's Chart, lab work & pertinent test results  Airway Mallampati: II       Dental  (+) Poor Dentition, Edentulous Upper,    Pulmonary    Pulmonary exam normal        Cardiovascular hypertension, Pt. on home beta blockers + CAD, + Past MI and + Cardiac Stents  Normal cardiovascular exam Rhythm:Regular Rate:Normal     Neuro/Psych    GI/Hepatic   Endo/Other  diabetes, Poorly Controlled, Type 2  Renal/GU ESRF and DialysisRenal disease     Musculoskeletal   Abdominal Normal abdominal exam  (+)   Peds  Hematology  (+) anemia ,   Anesthesia Other Findings ? The left ventricular systolic function is normal. ? LV end diastolic pressure is normal. ? The left ventricular ejection fraction is greater than 65% by visual estimate. ? There is no aortic valve stenosis. ? Mid LAD lesion is 90% stenosed. ? A drug-eluting stent was successfully placed using a STENT RESOLUTE ONYX 3.5X30, postdilated to > 4 mm. Stent optimized with IVUS. ? Post intervention, there is a 0% residual stenosis.  IMPRESSIONS    1. Left ventricular ejection fraction, by estimation, is 55 to 60%. The  left ventricle has normal function. The left ventricle has no regional  wall motion abnormalities. The left ventricular internal cavity size was  borderline dilated. There is moderate  left ventricular hypertrophy. Left ventricular diastolic parameters are  consistent with Grade II diastolic dysfunction (pseudonormalization).  Elevated left atrial pressure. The average left ventricular global  longitudinal strain is -13.0 %. The global  longitudinal strain is abnormal.  2. Right ventricular systolic function is normal. The right ventricular  size is mildly enlarged. Tricuspid regurgitation signal is inadequate  for  assessing PA pressure.  3. Left atrial size was mild to moderately dilated.  4. Right atrial size was mildly dilated.  5. There is a small to moderate pericardial effusion. There is no  evidence of cardiac tamponade.  6. The mitral valve is normal in structure. Trivial mitral valve  regurgitation. No evidence of mitral stenosis.  7. The aortic valve is tricuspid. Aortic valve regurgitation is not  visualized. No aortic stenosis is present.  8. Aortic dilatation noted. There is borderline dilatation of the aortic  root, measuring 38 mm.  9. Mildly dilated pulmonary artery.  10. The inferior vena cava is dilated in size with >50% respiratory  variability, suggesting right atrial pressure of 8 mmHg.    Reproductive/Obstetrics                           Anesthesia Physical Anesthesia Plan  ASA: 3  Anesthesia Plan: General   Post-op Pain Management:    Induction: Intravenous  PONV Risk Score and Plan: 2 and Ondansetron and Midazolam  Airway Management Planned: LMA  Additional Equipment: None  Intra-op Plan:   Post-operative Plan: Extubation in OR  Informed Consent: I have reviewed the patients History and Physical, chart, labs and discussed the procedure including the risks, benefits and alternatives for the proposed anesthesia with the patient or authorized representative who has indicated his/her understanding and acceptance.     Dental advisory given  Plan Discussed with: CRNA  Anesthesia Plan Comments:         Anesthesia Quick Evaluation

## 2021-02-09 NOTE — Interval H&P Note (Signed)
History and Physical Interval Note:  02/09/2021 3:56 PM  [name].  has presented today for surgery, with the diagnosis of Right ring finger Trauma.  The various methods of treatment have been discussed with the patient and family. After consideration of risks, benefits and other options for treatment, the patient has consented to  Procedure(s): AMPUTATION DIGIT (Right) as a surgical intervention.  The patient's history has been reviewed, patient examined, no change in status, stable for surgery.  I have reviewed the patient's chart and labs.  Questions were answered to the patient's satisfaction.      [name]

## 2021-02-09 NOTE — Transfer of Care (Signed)
Immediate Anesthesia Transfer of Care Note  Patient: [name].  Procedure(s) Performed: AMPUTATION DIGIT (Right: Ring Finger)  Patient Location: PACU  Anesthesia Type:General  Level of Consciousness: awake, alert  and patient cooperative  Airway & Oxygen Therapy: Patient Spontanous Breathing  Post-op Assessment: Report given to RN, Post -op Vital signs reviewed and stable and Patient moving all extremities X 4  Post vital signs: Reviewed and stable  Last Vitals:  Vitals Value Taken Time  BP 147/71 02/09/21 1758  Temp    Pulse 60 02/09/21 1802  Resp 12 02/09/21 1802  SpO2 98 % 02/09/21 1802  Vitals shown include unvalidated device data.  Last Pain:  Vitals:   02/09/21 1506  TempSrc: Oral  PainSc: 0-No pain         Complications: No notable events documented.

## 2021-02-09 NOTE — Progress Notes (Signed)
[facility] PROGRESS NOTE    Progress Note  [name].  ZDG:[id number] DOB: [birth year] DOA: 02/08/2021 PCP: [name], MD     Brief Narrative:   [name] Kliethermes. is an 50 y.o. male past medical history of end-stage renal disease, essential hypertension, diabetes mellitus type 2 gastroparesis, discharged on December 2020 through 4 right fourth digit wound he is not had a previous amputation site on antibiotics to be set up to complete procedure on 02/03/2021 also on this admission he was diagnosed with steal syndrome and right arm and had coil embolization of the right brachiocephalic AV fistula and large right cephalic vein branch comes into the [facility] ED with right 4th digit osteomyelitis with a previous seen by Dr. Robina Ade upon arrival scheduled for right finger amputation today.  He is currently on broad-spectrum antibiotics IV Vanco, cefepime and Flagyl was added.   Assessment/Plan:   Osteomyelitis of the right hand finger: MRI was done that showed right hand osteomyelitis with residual extensive soft tissue swelling and intramuscular edema. Patient is scheduled for amputation in the afternoon, continue IV vancomycin, cefepime and Flagyl. Blood cultures from admission have been negative till date. Orthopedic surgery has been notified, to dictate when to start Eliquis and or Plavix or aspirin  History of steal syndrome as a complication of dialysis access: Status post embolization of the right brachiocephalic AV fistula by IR at [facility]: He is now status post tunneled HD catheter placement after being discussed with the patient and vascular.  End-stage renal disease on dialysis : Patient had dialysis on 02/08/2019, renal has been notified, has no scheduled dialysis on 02/10/2021.  Paroxysmal atrial fibrillation: Eliquis has been held for surgical procedure.  Vascular surgery to dictate when to Eliquis and Plavix  Chronic diastolic heart failure Continue fluid  management per hemodialysis.  Anemia of chronic renal disease/vitamin deficiency/iron deficiency anemia: She was transfused 1 unit of packed red blood cells at [facility], hemoglobin this morning 7.2. Continue folate and B12 replacement.  Blindness of the left eye: Noted.  Essential hypertension: Stable continue to monitor.  Diabetic gastroparesis: Noted    DVT prophylaxis: none Family Communication:none Status is: Inpatient  Remains inpatient appropriate because: Osteomyelitis with amputation of the right fourth phalange   Code Status:     Code Status Orders  (From admission, onward)           Start     Ordered   02/08/21 2031  Full code  Continuous        02/08/21 2032           Code Status History     Date Active Date Inactive Code Status Order ID Comments User Context   02/04/2021 2259 02/08/2021 1902 Full Code [id number]  CoxBriant Cedar, DO Inpatient   01/29/2021 0207 01/30/2021 2354 Full Code [id number]  Athena Masse, MD ED   01/08/2021 1757 01/09/2021 2240 Full Code [id number]  Elmarie Shiley, MD ED   12/08/2020 1310 12/09/2020 1731 Full Code [id number]  Murlean Iba, MD ED   11/24/2020 2321 11/27/2020 2114 Full Code [id number]  Athena Masse, MD ED   02/26/2020 1355 02/28/2020 1856 Full Code [id number]  Jettie Booze, MD Inpatient   06/05/2019 0049 06/05/2019 1650 Full Code [id number]  Athena Masse, MD ED   09/13/2018 2343 09/15/2018 1508 Full Code [id number]  Lance Coon, MD Inpatient   11/08/2017 1610 11/10/2017 1511 Full Code [id number]  Henreitta Leber, MD Inpatient   07/18/2017 2120  07/19/2017 1825 Full Code [id number]  Gorden Harms, MD Inpatient   08/18/2016 2314 08/19/2016 1901 Full Code [id number]  Vaughan Basta, MD Inpatient   07/09/2016 0141 07/09/2016 1759 Full Code [id number]  Vaughan Basta, MD ED   03/15/2016 2207 03/17/2016 1753 Full Code [id number]  Etta Quill, DO ED   06/23/2015 2139 06/28/2015 1733 Full Code  [id number]  Ivor Costa, MD ED   03/07/2015 0133 03/10/2015 2107 Full Code [id number]  Harrie Foreman, MD Inpatient         IV Access:   Peripheral IV   Procedures and diagnostic studies:   MR HAND RIGHT WO CONTRAST  Result Date: 02/08/2021 CLINICAL DATA:  Osteomyelitis, hand EXAM: MRI OF THE RIGHT HAND WITHOUT CONTRAST TECHNIQUE: Multiplanar, multisequence MR imaging of the right hand was performed. No intravenous contrast was administered. COMPARISON:  Radiograph 02/04/2021, 01/28/2021, 11/24/2020. FINDINGS: Bones/Joint/Cartilage Prior ring finger amputation with residual proximal phalanx. There is bony edema and mildly low T1 signal within the proximal phalanx. Ligaments Intact MCP collateral ligaments. No pulley injury of the non amputated digits. Muscles and Tendons Retracted ring finger flexor tendons due to amputation. No other acute tendon tear. No tenosynovitis. Extensive intramuscular edema throughout the hand, ulnar-sided worse than radial. Soft tissues Extensive soft tissue swelling of the hand. There is no well-defined fluid collection. IMPRESSION: Findings are consistent with osteomyelitis of the residual ring finger proximal phalanx. Extensive soft tissue swelling and intramuscular edema throughout the hand, without well-defined fluid collection. Electronically Signed   By: Maurine Simmering M.D.   On: 02/08/2021 12:51     Medical Consultants:   None.   Subjective:    [name]. he relates no new complaints.  Objective:    Vitals:   02/08/21 2318 02/09/21 0330  BP: 119/62 126/68  Pulse: 64 60  Resp: 18 18  Temp: 98.6 F (37 C) 98.6 F (37 C)  TempSrc: Oral Oral  SpO2: 96% 97%   SpO2: 97 %  No intake or output data in the 24 hours ending 02/09/21 0655 There were no vitals filed for this visit.  Exam: General exam: In no acute distress. Respiratory system: Good air movement and clear to auscultation. Cardiovascular system: S1 & S2 heard, RRR. No  JVD. Gastrointestinal system: Abdomen is nondistended, soft and nontender.  Extremities: No pedal edema. Skin: No rashes, lesions or ulcers Psychiatry: Judgement and insight appear normal. Mood & affect appropriate.    Data Reviewed:    Labs: Basic Metabolic Panel: Recent Labs  Lab 02/04/21 2340 02/05/21 0143 02/06/21 0433 02/07/21 0332 02/08/21 0757 02/09/21 0221  NA  --  146* 141 140 134* 134*  K  --  5.8* 4.1 4.8 4.1 3.9  CL  --  120* 108 106 98 100  CO2  --  10* 22 21* 25 22  GLUCOSE  --  85 97 81 98 91  BUN  --  82* 49* 65* 40* 48*  CREATININE  --  17.53* 10.72* 12.24* 8.78* 10.40*  CALCIUM  --  8.3* 8.1* 8.1* 8.2* 8.1*  MG 2.5*  --  1.8 1.9  --   --   PHOS 11.5*  --  6.2* 8.8*  --   --    GFR Estimated Creatinine Clearance: 9.9 mL/min (A) (by C-G formula based on SCr of 10.4 mg/dL (H)). Liver Function Tests: Recent Labs  Lab 02/04/21 1628 02/06/21 0433 02/07/21 0332 02/08/21 0757 02/09/21 0221  AST 11* 11* 10* 8* 9*  ALT 14 11  6 <5 <5  ALKPHOS 64 57 52 50 55  BILITOT 0.2* 0.8 0.8 0.7 0.8  PROT 7.0 5.9* 6.0* 5.7* 5.4*  ALBUMIN 3.0* 2.5* 2.5* 2.2* 2.1*   No results for input(s): LIPASE, AMYLASE in the last 168 hours. No results for input(s): AMMONIA in the last 168 hours. Coagulation profile No results for input(s): INR, PROTIME in the last 168 hours. COVID-19 Labs  No results for input(s): DDIMER, FERRITIN, LDH, CRP in the last 72 hours.  Lab Results  Component Value Date   SARSCOV2NAA NEGATIVE 02/04/2021   [place] NEGATIVE 01/29/2021   [place] NEGATIVE 01/09/2021   [place] NEGATIVE 12/08/2020    CBC: Recent Labs  Lab 02/04/21 1628 02/05/21 0143 02/06/21 0433 02/07/21 0332 02/07/21 1628 02/08/21 0757 02/09/21 0221  WBC 10.8* 12.6* 11.9* 13.1*  --  15.2* 14.4*  NEUTROABS 7.5  --  8.8* 10.0*  --  12.0*  --   HGB 7.4* 7.2* 6.5* 6.8* 7.6* 7.4* 7.2*  HCT 25.5* 23.5* 20.6* 21.8* 23.6* 24.3* 23.5*  MCV 81.0 78.3* 75.2* 75.4*  --   76.9* 80.2  PLT 270 257 216 186  --  161 129*   Cardiac Enzymes: No results for input(s): CKTOTAL, CKMB, CKMBINDEX, TROPONINI in the last 168 hours. BNP (last 3 results) No results for input(s): PROBNP in the last 8760 hours. CBG: No results for input(s): GLUCAP in the last 168 hours. D-Dimer: No results for input(s): DDIMER in the last 72 hours. Hgb A1c: No results for input(s): HGBA1C in the last 72 hours. Lipid Profile: No results for input(s): CHOL, HDL, LDLCALC, TRIG, CHOLHDL, LDLDIRECT in the last 72 hours. Thyroid function studies: No results for input(s): TSH, T4TOTAL, T3FREE, THYROIDAB in the last 72 hours.  Invalid input(s): FREET3 Anemia work up: Recent Labs    02/07/21 0332  RETICCTPCT 0.9   Sepsis Labs: Recent Labs  Lab 02/04/21 1628 02/05/21 0143 02/06/21 0433 02/07/21 0332 02/08/21 0757 02/09/21 0221  WBC 10.8*   < > 11.9* 13.1* 15.2* 14.4*  LATICACIDVEN 0.6  --   --   --   --   --    < > = values in this interval not displayed.   Microbiology Recent Results (from the past 240 hour(s))  Culture, blood (routine x 2)     Status: None   Collection Time: 02/04/21  4:28 PM   Specimen: BLOOD  Result Value Ref Range Status   Specimen Description BLOOD BLOOD LEFT HAND  Final   Special Requests   Final    BOTTLES DRAWN AEROBIC AND ANAEROBIC Blood Culture results may not be optimal due to an excessive volume of blood received in culture bottles   Culture   Final    NO GROWTH 5 DAYS Performed at [facility], [address] [place], [place] [address]    Report Status 02/09/2021 FINAL  Final  Culture, blood (routine x 2)     Status: None   Collection Time: 02/04/21  4:30 PM   Specimen: BLOOD  Result Value Ref Range Status   Specimen Description BLOOD BLOOD LEFT FOREARM  Final   Special Requests   Final    BOTTLES DRAWN AEROBIC AND ANAEROBIC Blood Culture adequate volume   Culture   Final    NO GROWTH 5 DAYS Performed at [facility], [address], [place], [place] [address]    Report Status 02/09/2021 FINAL  Final  Resp Panel by RT-PCR (Flu A&B, Covid) Nasopharyngeal Swab     Status: None  Collection Time: 02/04/21  4:30 PM   Specimen: Nasopharyngeal Swab; Nasopharyngeal(NP) swabs in vial transport medium  Result Value Ref Range Status   SARS Coronavirus 2 by RT PCR NEGATIVE NEGATIVE Final    Comment: (NOTE) SARS-CoV-2 target nucleic acids are NOT DETECTED.  The SARS-CoV-2 RNA is generally detectable in upper respiratory specimens during the acute phase of infection. The lowest concentration of SARS-CoV-2 viral copies this assay can detect is 138 copies/mL. A negative result does not preclude SARS-Cov-2 infection and should not be used as the sole basis for treatment or other patient management decisions. A negative result may occur with  improper specimen collection/handling, submission of specimen other than nasopharyngeal swab, presence of viral mutation(s) within the areas targeted by this assay, and inadequate number of viral copies(<138 copies/mL). A negative result must be combined with clinical observations, patient history, and epidemiological information. The expected result is Negative.  Fact Sheet for Patients:  EntrepreneurPulse.com.au  Fact Sheet for Healthcare Providers:  IncredibleEmployment.be  This test is no t yet approved or cleared by the [place] FDA and  has been authorized for detection and/or diagnosis of SARS-CoV-2 by FDA under an Emergency Use Authorization (EUA). This EUA will remain  in effect (meaning this test can be used) for the duration of the COVID-19 declaration under Section 564(b)(1) of the Act, 21 U.S.C.section 360bbb-3(b)(1), unless the authorization is terminated  or revoked sooner.       Influenza A by PCR NEGATIVE NEGATIVE Final   Influenza B by PCR NEGATIVE NEGATIVE Final    Comment: (NOTE) The Xpert Xpress SARS-CoV-2/FLU/RSV plus  assay is intended as an aid in the diagnosis of influenza from Nasopharyngeal swab specimens and should not be used as a sole basis for treatment. Nasal washings and aspirates are unacceptable for Xpert Xpress SARS-CoV-2/FLU/RSV testing.  Fact Sheet for Patients: EntrepreneurPulse.com.au  Fact Sheet for Healthcare Providers: IncredibleEmployment.be  This test is not yet approved or cleared by the [place] FDA and has been authorized for detection and/or diagnosis of SARS-CoV-2 by FDA under an Emergency Use Authorization (EUA). This EUA will remain in effect (meaning this test can be used) for the duration of the COVID-19 declaration under Section 564(b)(1) of the Act, 21 U.S.C. section 360bbb-3(b)(1), unless the authorization is terminated or revoked.  Performed at [facility], [address], [place], [place] [address]   CULTURE, BLOOD (ROUTINE X 2) w Reflex to ID Panel     Status: None (Preliminary result)   Collection Time: 02/04/21 11:40 PM   Specimen: BLOOD  Result Value Ref Range Status   Specimen Description BLOOD LEFT ASSIST CONTROL  Final   Special Requests   Final    BOTTLES DRAWN AEROBIC AND ANAEROBIC Blood Culture adequate volume   Culture   Final    NO GROWTH 4 DAYS Performed at [facility], [address] [place], [place] [address]    Report Status PENDING  Incomplete  CULTURE, BLOOD (ROUTINE X 2) w Reflex to ID Panel     Status: None (Preliminary result)   Collection Time: 02/04/21 11:40 PM   Specimen: BLOOD  Result Value Ref Range Status   Specimen Description BLOOD LEFT HAND  Final   Special Requests   Final    BOTTLES DRAWN AEROBIC AND ANAEROBIC Blood Culture adequate volume   Culture   Final    NO GROWTH 4 DAYS Performed at [facility], [address], [place], [place] [address]    Report Status PENDING  Incomplete  MRSA Next Gen by PCR, Nasal     Status: None   Collection Time: 02/05/21   1:24 AM   Specimen: Nasal Mucosa; Nasal Swab  Result Value Ref Range Status   MRSA by PCR Next Gen NOT DETECTED NOT DETECTED Final    Comment: (NOTE) The GeneXpert MRSA Assay (FDA approved for NASAL specimens only), is one component of a comprehensive MRSA colonization surveillance program. It is not intended to diagnose MRSA infection nor to guide or monitor treatment for MRSA infections. Test performance is not FDA approved in patients less than 37 years old. Performed at [facility], [address], [place], [place] [address]   Surgical pcr screen     Status: None   Collection Time: 02/08/21 10:14 PM   Specimen: Nasal Mucosa; Nasal Swab  Result Value Ref Range Status   MRSA, PCR NEGATIVE NEGATIVE Final   Staphylococcus aureus NEGATIVE NEGATIVE Final    Comment: (NOTE) The Xpert SA Assay (FDA approved for NASAL specimens in patients 20 years of age and older), is one component of a comprehensive surveillance program. It is not intended to diagnose infection nor to guide or monitor treatment. Performed at [facility], [place] [address], [place], [place] [address]      Medications:    amiodarone  200 mg Oral Daily   atorvastatin  80 mg Oral QHS   calcium acetate  2,001 mg Oral TID WC   carvedilol  25 mg Oral BID WC   folic acid  1 mg Oral Daily   sodium chloride flush  3 mL Intravenous Q12H   tamsulosin  0.4 mg Oral QPC breakfast   cyanocobalamin  1,000 mcg Oral Daily   Continuous Infusions:  sodium chloride     ceFEPime (MAXIPIME) IV     metronidazole 500 mg (02/08/21 2205)   [START ON 02/10/2021] vancomycin        LOS: 1 day   Charlynne Cousins  [facility]  02/09/2021, 6:55 AM

## 2021-02-09 NOTE — Anesthesia Procedure Notes (Signed)
Procedure Name: LMA Insertion Date/Time: 02/09/2021 4:57 PM Performed by: Rande Brunt, CRNA Pre-anesthesia Checklist: Patient identified, Patient being monitored, Timeout performed, Emergency Drugs available and Suction available Patient Re-evaluated:Patient Re-evaluated prior to induction Oxygen Delivery Method: Circle system utilized Preoxygenation: Pre-oxygenation with 100% oxygen Induction Type: IV induction Ventilation: Mask ventilation without difficulty LMA: LMA inserted LMA Size: 5.0 Tube type: Oral Number of attempts: 1 Placement Confirmation: positive ETCO2 and breath sounds checked- equal and bilateral Tube secured with: Tape Dental Injury: Teeth and Oropharynx as per pre-operative assessment

## 2021-02-09 NOTE — Progress Notes (Signed)
Pt arrived to the unit at 2000 from PACU. Pt is alert and oriented. Compression wrap on his right hand is clean, and dry. Capillary refill is less than 3 seconds. Pt is able to move his fingers. Pt is placed on telemetry. Per CCMD pt has inconsistent high ST-V. [facility] is notified. Pt is NSR right now.

## 2021-02-09 NOTE — Discharge Instructions (Signed)
.  cbdc

## 2021-02-09 NOTE — Op Note (Signed)
Date of Surgery: 02/09/2021  INDICATIONS: Mr. 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  MRI performed yesterday demonstrated increased signal within the proximal phalanx suggestive of osteomyelitis without change in signal at the metacarpal.  Risks, benefits, and alternatives to surgery were again discussed with the patient wishing to proceed with surgery.  Informed consent was signed after our discussion.   PREOPERATIVE DIAGNOSIS: 1. 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  PROCEDURE: 1. Right ring finger MP disarticulation   SURGEON: Audria Nine, M.D.  ASSIST:   ANESTHESIA:  general  IV FLUIDS AND URINE: See anesthesia.  ESTIMATED BLOOD LOSS: 15 mL.  IMPLANTS: * No implants in log *   DRAINS: None  COMPLICATIONS: None  DESCRIPTION OF PROCEDURE: The patient was met in the preoperative holding area where the surgical site was marked and the consent form was verified.  The patient was then taken to the operating room and transferred to the operating table.  All bony prominences were well padded.  A tourniquet was applied to the right forearm.  The operative extremity was prepped and draped in the usual and sterile fashion.  A formal time-out was performed to confirm that this was the correct patient, surgery, side, and site.   The tourniquet was not inflated.  A fishmouth type incision was made at the level of the proximal aspect of the proximal phalanx.  The skin and subcutaneous tissue was sharply divided.  The extensor tendon was necrotic appearing.  The fish mouth incision was extended proximally given the appearance of the distal extensor tendon.  The tendon was sharply divided.  The proximal phalanx was excised.  The flexor tendons were identified, pulled distally, and transected.  All necrotic tissue was sharply excised with a 15 blade.  There was healthy bleeding  from the tissue bed following debridement and resection of the proximal phalanx.  The wound was thoroughly irrigated with copious sterile saline.  Both swabs and tissue specimens were sent for culture. Following thorough irrigation, the wound was closed using 4-0 nylon sutures in a combination of simple and horizontal mattress fashion.  The wound was dressed with xeroform, bacitracin ointment, 4x4, cast padding, and an ACE wrap.  The patient was then reversed from anesthesia and extubated uneventfully.  He was transferred to the postoperative bed.  All counts were correct at the end of the procedure.  The patient was then taken to the PACU in stable condition.    POSTOPERATIVE PLAN: Patient will be transferred back to floor.  Will undergo dialysis tomorrow.  Can likely be discharged to home tomorrow pending patient's condition.    Audria Nine, MD 5:57 PM

## 2021-02-10 ENCOUNTER — Encounter (HOSPITAL_COMMUNITY): Payer: Self-pay | Admitting: Orthopedic Surgery

## 2021-02-10 ENCOUNTER — Other Ambulatory Visit (HOSPITAL_COMMUNITY): Payer: Self-pay

## 2021-02-10 LAB — CULTURE, BLOOD (ROUTINE X 2)
Culture: NO GROWTH
Culture: NO GROWTH
Special Requests: ADEQUATE
Special Requests: ADEQUATE

## 2021-02-10 MED ORDER — CARVEDILOL 25 MG PO TABS
25.0000 mg | ORAL_TABLET | Freq: Two times a day (BID) | ORAL | 1 refills | Status: DC
  Filled 2021-02-10: qty 60, 30d supply, fill #0

## 2021-02-10 MED ORDER — CLOPIDOGREL BISULFATE 75 MG PO TABS
75.0000 mg | ORAL_TABLET | Freq: Every day | ORAL | Status: DC
  Administered 2021-02-10: 10:00:00 75 mg via ORAL
  Filled 2021-02-10: qty 1

## 2021-02-10 MED ORDER — APIXABAN 5 MG PO TABS
5.0000 mg | ORAL_TABLET | Freq: Two times a day (BID) | ORAL | 0 refills | Status: DC
  Filled 2021-02-10: qty 60, 30d supply, fill #0

## 2021-02-10 MED ORDER — CLOPIDOGREL BISULFATE 75 MG PO TABS: 75.0000 mg | ORAL_TABLET | Freq: Every day | ORAL | Status: DC

## 2021-02-10 MED ORDER — SULFAMETHOXAZOLE-TRIMETHOPRIM 800-160 MG PO TABS
1.0000 | ORAL_TABLET | ORAL | 0 refills | Status: DC
  Filled 2021-02-10: qty 3, 6d supply, fill #0

## 2021-02-10 MED ORDER — CLOPIDOGREL BISULFATE 75 MG PO TABS
75.0000 mg | ORAL_TABLET | Freq: Every day | ORAL | 3 refills | Status: DC
  Filled 2021-02-10: qty 90, 90d supply, fill #0

## 2021-02-10 MED ORDER — AMIODARONE HCL 200 MG PO TABS
200.0000 mg | ORAL_TABLET | Freq: Every day | ORAL | 1 refills | Status: DC
  Filled 2021-02-10: qty 30, 30d supply, fill #0

## 2021-02-10 MED ORDER — ALUM & MAG HYDROXIDE-SIMETH 200-200-20 MG/5ML PO SUSP
30.0000 mL | Freq: Four times a day (QID) | ORAL | Status: DC | PRN
  Administered 2021-02-10: 04:00:00 30 mL via ORAL
  Filled 2021-02-10: qty 30

## 2021-02-10 MED ORDER — APIXABAN 5 MG PO TABS
5.0000 mg | ORAL_TABLET | Freq: Two times a day (BID) | ORAL | Status: DC
  Administered 2021-02-10: 10:00:00 5 mg via ORAL
  Filled 2021-02-10: qty 1

## 2021-02-10 MED ORDER — AMLODIPINE BESYLATE 5 MG PO TABS
5.0000 mg | ORAL_TABLET | Freq: Every day | ORAL | 0 refills | Status: DC
  Filled 2021-02-10: qty 30, 30d supply, fill #0

## 2021-02-10 NOTE — Progress Notes (Signed)
Contacted by pt's RN regarding pt's need for HD today or tomorrow and pt for d/c today. Pt receives out-pt HD at [facility] on TTS schedule. Contacted clinic and spoke to [place], Therapist, sports. Discussed pt's situation and clinic agreeable to treat pt tomorrow at 10:15. RN made aware of this info and advised pt and physicians regarding this option. Pt agreeable to treatment tomorrow but informed RN that he would likely not go to treatment on Thursday but would go on Saturday. Physicians made aware of this info and agreeable to pt d/c today and receive treatment at out-pt clinic tomorrow. Navigator returned call to Christopher at clinic to make clinic aware pt will be at clinic tomorrow at 10:15 to receive treatment. Avis agreeable to plan. Avis also advised of pt's statement regarding days he is agreeable to treatment and shared with MD at clinic who was agreeable to plan as well. Will fax d/c summary to clinic for continuation of care.  Melven Sartorius Renal Navigator [phone]

## 2021-02-10 NOTE — Plan of Care (Signed)
  Problem: Education: Goal: Knowledge of General Education information will improve Description: Including pain rating scale, medication(s)/side effects and non-pharmacologic comfort measures Outcome: Progressing   Problem: Activity: Goal: Risk for activity intolerance will decrease Outcome: Progressing   

## 2021-02-10 NOTE — Discharge Summary (Signed)
Physician Discharge Summary  [name] RWE:[id number] DOB: 1970/05/24 DOA: 02/08/2021  PCP: [name], MD  Admit date: 02/08/2021 Discharge date: 02/10/2021  Admitted From: Home Disposition:  Home  Recommendations for Outpatient Follow-up:  Follow up with Hand surgery in 1-2 weeks Please obtain BMP/CBC in one week   Home Health:No Equipment/Devices:None  Discharge Condition:Stable CODE STATUS:Full Diet recommendation: Heart Healthy  Brief/Interim Summary: 50 y.o. male past medical history of end-stage renal disease, essential hypertension, diabetes mellitus type 2 gastroparesis, discharged on December 2020 through 4 right fourth digit wound he is not had a previous amputation site on antibiotics to be set up to complete procedure on 02/03/2021 also on this admission he was diagnosed with steal syndrome and right arm and had coil embolization of the right brachiocephalic AV fistula and large right cephalic vein branch comes into the [facility] ED with right 4th digit osteomyelitis with a previous seen by Dr. Robina Ade upon arrival scheduled for right finger amputation today.  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 Hand surgery was consulted who performed amputation and disarticulation. Also on admission he was started on IV vancomycin cefepime and Flagyl this was de-escalated  to Bactrim which she will continue for 1 week post discharge. Blood cultures remain negative till date.  History of steal syndrome as complication of dialysis access: Status post embolization with coiling of the right brachiocephalic AV fistula by IR at [facility]. He is status post HD tunneled catheter placement. He will continue Plavix as an outpatient.  End-stage renal disease: No changes made to his dialysis.  Paroxysmal atrial fibrillation: Eliquis was held presurgical procedure will resume postsurgical procedure.  Chronic diastolic heart failure: Continue further management per dialysis.  Anemia chronic disease/vitamin D deficiency/iron deficiency anemia: Transfuse 1 unit of packed red blood cells and hemoglobin stable. Continue folate and B12 replacement.  Blindness of the left eye: Noted.  Central hypertension: No changes made.  Discharge Instructions  Discharge Instructions     Diet - low sodium heart healthy   Complete by: As directed    Increase activity slowly   Complete by: As directed    No wound care   Complete by: As directed       Allergies as of 02/10/2021       Reactions   Levofloxacin Swelling   Of face Other reaction(s): swelling   Promethazine Diarrhea, Other (See Comments)   Other reaction(s): Other (See Comments), Other (See Comments) Other reaction(s): Muscle cramps Muscle cramps Cramping Other reaction(s): Muscle cramps Muscle cramps   Other    Other reaction(s): Unknown   Malt Other (See Comments)   Other reaction(s): cramping        Medication List     STOP taking these medications    lidocaine-prilocaine cream Commonly known as: EMLA       TAKE these medications    acetaminophen 325 MG tablet Commonly known as: TYLENOL Take 2  tablets (650 mg total) by mouth every 6 (six) hours as needed for mild pain (or Fever >/= 101).   amiodarone 200 MG tablet Commonly known as: Pacerone Take 1 tablet (200 mg total) by mouth  daily. 1 daily What changed: additional instructions   amLODipine 5 MG tablet Commonly known as: NORVASC Take 1 tablet (5 mg total) by mouth daily.   apixaban 5 MG Tabs tablet Commonly known as: ELIQUIS Take 1 tablet (5 mg total) by mouth 2 (two) times daily.   artificial tears Oint ophthalmic ointment Commonly known as: LACRILUBE Place into the left eye 4 (four) times daily.   atorvastatin 80 MG tablet Commonly known as: LIPITOR TAKE 1 TABLET (80 MG TOTAL) BY MOUTH AT BEDTIME.   azelastine 0.1 % nasal spray Commonly known as: ASTELIN Place 2 sprays into both nostrils 2 (two) times daily.   calcium acetate 667 MG capsule Commonly known as: PHOSLO Take 3 capsules (2,001 mg total) by mouth 3 (three) times daily with meals.   carvedilol 25 MG tablet Commonly known as: COREG Take 1 tablet (25 mg total) by mouth 2 (two) times daily with a meal.   clopidogrel 75 MG tablet Commonly known as: PLAVIX Take 1 tablet (75 mg total) by mouth daily.   cyanocobalamin 1000 MCG tablet Take 1 tablet (1,000 mcg total) by mouth daily.   FeroSul 325 (65 FE) MG tablet Generic drug: ferrous sulfate Take 325 mg by mouth daily.   fluticasone 50 MCG/ACT nasal spray Commonly known as: FLONASE Place 2 sprays into both nostrils daily.   folic acid 1 MG tablet Commonly known as: FOLVITE Take 1 tablet (1 mg total) by mouth daily.   HYDROcodone-acetaminophen 5-325 MG tablet Commonly known as: NORCO/VICODIN Take 2 tablets by mouth 2 (two) times daily.   nitroGLYCERIN 0.4 MG SL tablet Commonly known as: NITROSTAT Place 1 tablet (0.4 mg total) under the tongue every 5 (five) minutes as needed for chest pain.   prednisoLONE acetate 1 % ophthalmic suspension Commonly known as: PRED FORTE Place 1 drop into the left eye every 4 (four) hours.   sulfamethoxazole-trimethoprim 800-160 MG tablet Commonly known as: BACTRIM DS Take 1 tablet by mouth every other day.   tamsulosin 0.4 MG Caps  capsule Commonly known as: FLOMAX Take 0.4 mg by mouth daily after breakfast.        Allergies  Allergen Reactions   Levofloxacin Swelling    Of face Other reaction(s): swelling   Promethazine Diarrhea and Other (See Comments)    Other reaction(s): Other (See Comments), Other (See Comments) Other reaction(s): Muscle cramps Muscle cramps Cramping Other reaction(s): Muscle cramps Muscle cramps   Other     Other reaction(s): Unknown   Malt Other (See Comments)    Other reaction(s): cramping    Consultations: Hand surgery orthopedics   Procedures/Studies: MR HAND RIGHT WO CONTRAST  Result Date: 02/08/2021 CLINICAL DATA:  Osteomyelitis, hand EXAM: MRI OF THE RIGHT HAND WITHOUT CONTRAST TECHNIQUE: Multiplanar, multisequence MR imaging of the right hand was performed. No intravenous contrast was administered. COMPARISON:  Radiograph 02/04/2021, 01/28/2021, 11/24/2020. FINDINGS: Bones/Joint/Cartilage Prior ring finger amputation with residual proximal phalanx. There is bony edema and mildly low T1 signal within the proximal phalanx. Ligaments Intact MCP collateral ligaments. No pulley injury of the non amputated digits. Muscles and Tendons Retracted ring finger flexor tendons due to amputation. No other acute tendon tear. No tenosynovitis. Extensive intramuscular edema throughout the hand, ulnar-sided worse than radial. Soft tissues Extensive soft tissue swelling  of the hand. There is no well-defined fluid collection. IMPRESSION: Findings are consistent with osteomyelitis of the residual ring finger proximal phalanx. Extensive soft tissue swelling and intramuscular edema throughout the hand, without well-defined fluid collection. Electronically Signed   By: Maurine Simmering M.D.   On: 02/08/2021 12:51   PERIPHERAL VASCULAR CATHETERIZATION  Result Date: 02/06/2021 See surgical note for result.  PERIPHERAL VASCULAR CATHETERIZATION  Result Date: 02/05/2021 See surgical note for  result.  DG Hand Complete Right  Result Date: 02/04/2021 CLINICAL DATA:  Right hand wound, fever.  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 New destructive changes involving the base of the ring finger proximal phalanx compatible with acute osteomyelitis. No fracture or dislocation. Osseous structures are demineralized. Severe small vessel atherosclerotic calcifications. Prominent soft tissue swelling of the hand. IMPRESSION: New destructive changes involving the base of the ring finger proximal phalanx compatible with acute osteomyelitis. Electronically Signed   By: Davina Poke D.O.   On: 02/04/2021 16:49   DG Hand Complete Right  Result Date: 01/28/2021 CLINICAL DATA:  Pt comes into the ED via POV c/o infection in his right hand. 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 Sharp margins. No cortical erosion or destruction. There is no evidence of fracture or dislocation. There is no evidence of severe arthropathy or aggressive appearing focal bone abnormality. Soft tissues are unremarkable. Vascular calcifications. 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 Electronically Signed   By: Iven Finn M.D.   On: 01/28/2021 18:55   VAS US DUPLEX DIALYSIS ACCESS (AVF,AVG)  Result Date: 02/09/2021 DIALYSIS ACCESS Patient Name:  Leotis Isham.  Date of Exam:   02/04/2021 Medical Rec #: [id number]             Accession #:    [id number] Date of Birth: [birth year]              Patient Gender: M Patient Age:   35 years Exam Location:  [facility] Procedure:      VAS US DUPLEX DIALYSIS ACCESS (AVF, AVG)  Referring Phys: [name] --------------------------------------------------------------------------------  Reason for Exam: S/P banding 11/27/2020. Access Site: Right Upper Extremity. Access Type: Brachial-cephalic AVF. History: Created 05/31/2019. Comparison Study: 08/19/2020 Performing Technologist: Concha Norway RVT  Examination Guidelines: A complete evaluation includes B-mode imaging, spectral Doppler, color Doppler, and power Doppler as needed of all accessible portions of each vessel. Unilateral testing is considered an integral part of a complete examination. Limited examinations for reoccurring indications may be performed as noted.  Findings: +--------------------+----------+-----------------+--------+  AVF                  PSV (cm/s) Flow Vol (mL/min) Comments  +--------------------+----------+-----------------+--------+  Native artery inflow    316           2618                  +--------------------+----------+-----------------+--------+  AVF Anastomosis         307                                 +--------------------+----------+-----------------+--------+  +---------------+----------+-------------+----------+--------+  OUTFLOW VEIN    PSV (cm/s) Diameter (cm) Depth (cm) Describe  +---------------+----------+-------------+----------+--------+  Subclavian vein     83                                        +---------------+----------+-------------+----------+--------+  Confluence         213                                        +---------------+----------+-------------+----------+--------+  Prox UA            147                                        +---------------+----------+-------------+----------+--------+  Mid UA              75                                        +---------------+----------+-------------+----------+--------+  Dist UA            284                                        +---------------+----------+-------------+----------+--------+   +-------------+-------------+----------+---------+---------+-------------------+                Diameter (cm) Depth (cm) Branching    PSV        Flow Volume                                                         (cm/s)        (ml/min)        +-------------+-------------+----------+---------+---------+-------------------+  Rt Rad Art                                          103                          Dis                                                                             +-------------+-------------+----------+---------+---------+-------------------+  Rt Uln Art                                          78  Dis                                                                             +-------------+-------------+----------+---------+---------+-------------------+  Summary: Patent arteriovenous fistula. *See table(s) above for measurements and observations.  Diagnosing physician: [name] MD Electronically signed by [name] MD on 02/09/2021 at 12:42:53 PM.   --------------------------------------------------------------------------------   Final    (Echo, Carotid, EGD, Colonoscopy, ERCP)    Subjective: No complaints  Discharge Exam: Vitals:   02/10/21 0440 02/10/21 0740  BP: (!) 150/73 (!) 167/68  Pulse: 62 62  Resp: 14 15  Temp: 98.3 F (36.8 C) 98.6 F (37 C)  SpO2: 99% 100%   Vitals:   02/09/21 2100 02/10/21 0032 02/10/21 0440 02/10/21 0740  BP: (!) 127/55 (!) 141/61 (!) 150/73 (!) 167/68  Pulse: 64 62 62 62  Resp: 16 15 14 15   Temp: 98.2 F (36.8 C)  98.3 F (36.8 C) 98.6 F (37 C)  TempSrc: Oral  Oral Oral  SpO2: 100% 100% 99% 100%    General: Pt is alert, awake, not in acute distress Cardiovascular: RRR, S1/S2 +, no rubs, no gallops Respiratory: CTA bilaterally, no wheezing, no rhonchi Abdominal: Soft, NT, ND, bowel sounds + Extremities: no edema, no cyanosis    The results of significant diagnostics from this hospitalization  (including imaging, microbiology, ancillary and laboratory) are listed below for reference.     Microbiology: Recent Results (from the past 240 hour(s))  Culture, blood (routine x 2)     Status: None   Collection Time: 02/04/21  4:28 PM   Specimen: BLOOD  Result Value Ref Range Status   Specimen Description BLOOD BLOOD LEFT HAND  Final   Special Requests   Final    BOTTLES DRAWN AEROBIC AND ANAEROBIC Blood Culture results may not be optimal due to an excessive volume of blood received in culture bottles   Culture   Final    NO GROWTH 5 DAYS Performed at [facility], [address] [place], [place] [address]    Report Status 02/09/2021 FINAL  Final  Culture, blood (routine x 2)     Status: None   Collection Time: 02/04/21  4:30 PM   Specimen: BLOOD  Result Value Ref Range Status   Specimen Description BLOOD BLOOD LEFT FOREARM  Final   Special Requests   Final    BOTTLES DRAWN AEROBIC AND ANAEROBIC Blood Culture adequate volume   Culture   Final    NO GROWTH 5 DAYS Performed at [facility], [address], [place], [place] [address]    Report Status 02/09/2021 FINAL  Final  Resp Panel by RT-PCR (Flu A&B, Covid) Nasopharyngeal Swab     Status: None   Collection Time: 02/04/21  4:30 PM   Specimen: Nasopharyngeal Swab; Nasopharyngeal(NP) swabs in vial transport medium  Result Value Ref Range Status   SARS Coronavirus 2 by RT PCR NEGATIVE NEGATIVE Final    Comment: (NOTE) SARS-CoV-2 target nucleic acids are NOT DETECTED.  The SARS-CoV-2 RNA is generally detectable in upper respiratory specimens during the acute phase of infection. The lowest concentration of SARS-CoV-2 viral copies this assay can detect is 138 copies/mL. A negative result does not preclude  SARS-Cov-2 infection and should not be used as the sole basis for treatment or other patient management decisions. A negative result may occur with  improper specimen collection/handling, submission of specimen other than  nasopharyngeal swab, presence of viral mutation(s) within the areas targeted by this assay, and inadequate number of viral copies(<138 copies/mL). A negative result must be combined with clinical observations, patient history, and epidemiological information. The expected result is Negative.  Fact Sheet for Patients:  EntrepreneurPulse.com.au  Fact Sheet for Healthcare Providers:  IncredibleEmployment.be  This test is no t yet approved or cleared by the [place] FDA and  has been authorized for detection and/or diagnosis of SARS-CoV-2 by FDA under an Emergency Use Authorization (EUA). This EUA will remain  in effect (meaning this test can be used) for the duration of the COVID-19 declaration under Section 564(b)(1) of the Act, 21 U.S.C.section 360bbb-3(b)(1), unless the authorization is terminated  or revoked sooner.       Influenza A by PCR NEGATIVE NEGATIVE Final   Influenza B by PCR NEGATIVE NEGATIVE Final    Comment: (NOTE) The Xpert Xpress SARS-CoV-2/FLU/RSV plus assay is intended as an aid in the diagnosis of influenza from Nasopharyngeal swab specimens and should not be used as a sole basis for treatment. Nasal washings and aspirates are unacceptable for Xpert Xpress SARS-CoV-2/FLU/RSV testing.  Fact Sheet for Patients: EntrepreneurPulse.com.au  Fact Sheet for Healthcare Providers: IncredibleEmployment.be  This test is not yet approved or cleared by the [place] FDA and has been authorized for detection and/or diagnosis of SARS-CoV-2 by FDA under an Emergency Use Authorization (EUA). This EUA will remain in effect (meaning this test can be used) for the duration of the COVID-19 declaration under Section 564(b)(1) of the Act, 21 U.S.C. section 360bbb-3(b)(1), unless the authorization is terminated or revoked.  Performed at [facility], [address], [place], [place] [address]    CULTURE, BLOOD (ROUTINE X 2) w Reflex to ID Panel     Status: None   Collection Time: 02/04/21 11:40 PM   Specimen: BLOOD  Result Value Ref Range Status   Specimen Description BLOOD LEFT ASSIST CONTROL  Final   Special Requests   Final    BOTTLES DRAWN AEROBIC AND ANAEROBIC Blood Culture adequate volume   Culture   Final    NO GROWTH 5 DAYS Performed at [facility], [address] [place], [place] [address]    Report Status 02/10/2021 FINAL  Final  CULTURE, BLOOD (ROUTINE X 2) w Reflex to ID Panel     Status: None   Collection Time: 02/04/21 11:40 PM   Specimen: BLOOD  Result Value Ref Range Status   Specimen Description BLOOD LEFT HAND  Final   Special Requests   Final    BOTTLES DRAWN AEROBIC AND ANAEROBIC Blood Culture adequate volume   Culture   Final    NO GROWTH 5 DAYS Performed at [facility], [address] [place], [place] [address]    Report Status 02/10/2021 FINAL  Final  MRSA Next Gen by PCR, Nasal     Status: None   Collection Time: 02/05/21  1:24 AM   Specimen: Nasal Mucosa; Nasal Swab  Result Value Ref Range Status   MRSA by PCR Next Gen NOT DETECTED NOT DETECTED Final    Comment: (NOTE) The GeneXpert MRSA Assay (FDA approved for NASAL specimens only), is one component of a comprehensive MRSA colonization surveillance program. It is not intended to diagnose MRSA infection nor to guide or  monitor treatment for MRSA infections. Test performance is not FDA approved in patients less than 35 years old. Performed at [facility], [address] [place], [place] [address]   Surgical pcr screen     Status: None   Collection Time: 02/08/21 10:14 PM   Specimen: Nasal Mucosa; Nasal Swab  Result Value Ref Range Status   MRSA, PCR NEGATIVE NEGATIVE Final   Staphylococcus aureus NEGATIVE NEGATIVE Final    Comment: (NOTE) The Xpert SA Assay (FDA approved for NASAL specimens in patients 52 years of age and older), is one component of a  comprehensive surveillance program. It is not intended to diagnose infection nor to guide or monitor treatment. Performed at [facility], [place] [address] [place], [place] [address]   Aerobic/Anaerobic Culture w Gram Stain (surgical/deep wound)     Status: None (Preliminary result)   Collection Time: 02/09/21  5:11 PM   Specimen: Wound; Tissue  Result Value Ref Range Status   Specimen Description WOUND RIGHT FINGER  Final   Special Requests SPEC A  Final   Gram Stain   Final    NO SQUAMOUS EPITHELIAL CELLS SEEN FEW WBC SEEN NO ORGANISMS SEEN Performed at [facility], [address], [place], [place] [address]    Culture PENDING  Incomplete   Report Status PENDING  Incomplete  Aerobic/Anaerobic Culture w Gram Stain (surgical/deep wound)     Status: None (Preliminary result)   Collection Time: 02/09/21  5:15 PM   Specimen: Wound; Tissue  Result Value Ref Range Status   Specimen Description TISSUE RIGHT FINGER  Final   Special Requests SPEC B  Final   Gram Stain   Final    NO SQUAMOUS EPITHELIAL CELLS SEEN FEW WBC SEEN NO ORGANISMS SEEN Performed at [facility], [address], [place], [place] [address]    Culture PENDING  Incomplete   Report Status PENDING  Incomplete     Labs: BNP (last 3 results) Recent Labs    12/08/20 1004  BNP 833.8*   Basic Metabolic Panel: Recent Labs  Lab 02/04/21 2340 02/05/21 0143 02/06/21 0433 02/07/21 0332 02/08/21 0757 02/09/21 0221  NA  --  146* 141 140 134* 134*  K  --  5.8* 4.1 4.8 4.1 3.9  CL  --  120* 108 106 98 100  CO2  --  10* 22 21* 25 22  GLUCOSE  --  85 97 81 98 91  BUN  --  82* 49* 65* 40* 48*  CREATININE  --  17.53* 10.72* 12.24* 8.78* 10.40*  CALCIUM  --  8.3* 8.1* 8.1* 8.2* 8.1*  MG 2.5*  --  1.8 1.9  --   --   PHOS 11.5*  --  6.2* 8.8*  --   --    Liver Function Tests: Recent Labs  Lab 02/04/21 1628 02/06/21 0433 02/07/21 0332 02/08/21 0757 02/09/21 0221  AST 11* 11* 10* 8* 9*  ALT  14 11 6  <5 <5  ALKPHOS 64 57 52 50 55  BILITOT 0.2* 0.8 0.8 0.7 0.8  PROT 7.0 5.9* 6.0* 5.7* 5.4*  ALBUMIN 3.0* 2.5* 2.5* 2.2* 2.1*   No results for input(s): LIPASE, AMYLASE in the last 168 hours. No results for input(s): AMMONIA in the last 168 hours. CBC: Recent Labs  Lab 02/04/21 1628 02/05/21 0143 02/06/21 0433 02/07/21 0332 02/07/21 1628 02/08/21 0757 02/09/21 0221  WBC 10.8* 12.6* 11.9* 13.1*  --  15.2* 14.4*  NEUTROABS 7.5  --  8.8* 10.0*  --  12.0*  --   HGB 7.4* 7.2* 6.5* 6.8* 7.6* 7.4* 7.2*  HCT 25.5* 23.5* 20.6* 21.8* 23.6* 24.3* 23.5*  MCV 81.0 78.3* 75.2* 75.4*  --  76.9* 80.2  PLT 270 257 216 186  --  161 129*   Cardiac Enzymes: No results for input(s): CKTOTAL, CKMB, CKMBINDEX, TROPONINI in the last 168 hours. BNP: Invalid input(s): POCBNP CBG: Recent Labs  Lab 02/09/21 1759  GLUCAP 74   D-Dimer No results for input(s): DDIMER in the last 72 hours. Hgb A1c No results for input(s): HGBA1C in the last 72 hours. Lipid Profile No results for input(s): CHOL, HDL, LDLCALC, TRIG, CHOLHDL, LDLDIRECT in the last 72 hours. Thyroid function studies No results for input(s): TSH, T4TOTAL, T3FREE, THYROIDAB in the last 72 hours.  Invalid input(s): FREET3 Anemia work up No results for input(s): VITAMINB12, FOLATE, FERRITIN, TIBC, IRON, RETICCTPCT in the last 72 hours. Urinalysis    Component Value Date/Time   COLORURINE YELLOW (A) 11/07/2017 1855   APPEARANCEUR CLOUDY (A) 11/07/2017 1855   LABSPEC 1.016 11/07/2017 1855   PHURINE 5.0 11/07/2017 1855   GLUCOSEU >=500 (A) 11/07/2017 1855   HGBUR MODERATE (A) 11/07/2017 1855   BILIRUBINUR NEGATIVE 11/07/2017 1855   KETONESUR NEGATIVE 11/07/2017 1855   PROTEINUR >=300 (A) 11/07/2017 1855   NITRITE NEGATIVE 11/07/2017 1855   LEUKOCYTESUR NEGATIVE 11/07/2017 1855   Sepsis Labs Invalid input(s): PROCALCITONIN,  WBC,  LACTICIDVEN Microbiology Recent Results (from the past 240 hour(s))  Culture, blood (routine  x 2)     Status: None   Collection Time: 02/04/21  4:28 PM   Specimen: BLOOD  Result Value Ref Range Status   Specimen Description BLOOD BLOOD LEFT HAND  Final   Special Requests   Final    BOTTLES DRAWN AEROBIC AND ANAEROBIC Blood Culture results may not be optimal due to an excessive volume of blood received in culture bottles   Culture   Final    NO GROWTH 5 DAYS Performed at [facility], [address] [place], [place] [address]    Report Status 02/09/2021 FINAL  Final  Culture, blood (routine x 2)     Status: None   Collection Time: 02/04/21  4:30 PM   Specimen: BLOOD  Result Value Ref Range Status   Specimen Description BLOOD BLOOD LEFT FOREARM  Final   Special Requests   Final    BOTTLES DRAWN AEROBIC AND ANAEROBIC Blood Culture adequate volume   Culture   Final    NO GROWTH 5 DAYS Performed at [facility], [address], [place],  [address]    Report Status 02/09/2021 FINAL  Final  Resp Panel by RT-PCR (Flu A&B, Covid) Nasopharyngeal Swab     Status: None   Collection Time: 02/04/21  4:30 PM   Specimen: Nasopharyngeal Swab; Nasopharyngeal(NP) swabs in vial transport medium  Result Value Ref Range Status   SARS Coronavirus 2 by RT PCR NEGATIVE NEGATIVE Final    Comment: (NOTE) SARS-CoV-2 target nucleic acids are NOT DETECTED.  The SARS-CoV-2 RNA is generally detectable in upper respiratory specimens during the acute phase of infection. The lowest concentration of SARS-CoV-2 viral copies this assay can detect is 138 copies/mL. A negative result does not preclude SARS-Cov-2 infection and should not be used as the sole basis for treatment or other patient management decisions. A negative result may occur with  improper specimen collection/handling, submission of specimen other than nasopharyngeal swab, presence of viral mutation(s) within the areas targeted  by this assay, and inadequate number of viral copies(<138 copies/mL). A negative result must be combined  with clinical observations, patient history, and epidemiological information. The expected result is Negative.  Fact Sheet for Patients:  EntrepreneurPulse.com.au  Fact Sheet for Healthcare Providers:  IncredibleEmployment.be  This test is no t yet approved or cleared by the [place] FDA and  has been authorized for detection and/or diagnosis of SARS-CoV-2 by FDA under an Emergency Use Authorization (EUA). This EUA will remain  in effect (meaning this test can be used) for the duration of the COVID-19 declaration under Section 564(b)(1) of the Act, 21 U.S.C.section 360bbb-3(b)(1), unless the authorization is terminated  or revoked sooner.       Influenza A by PCR NEGATIVE NEGATIVE Final   Influenza B by PCR NEGATIVE NEGATIVE Final    Comment: (NOTE) The Xpert Xpress SARS-CoV-2/FLU/RSV plus assay is intended as an aid in the diagnosis of influenza from Nasopharyngeal swab specimens and should not be used as a sole basis for treatment. Nasal washings and aspirates are unacceptable for Xpert Xpress SARS-CoV-2/FLU/RSV testing.  Fact Sheet for Patients: EntrepreneurPulse.com.au  Fact Sheet for Healthcare Providers: IncredibleEmployment.be  This test is not yet approved or cleared by the [place] FDA and has been authorized for detection and/or diagnosis of SARS-CoV-2 by FDA under an Emergency Use Authorization (EUA). This EUA will remain in effect (meaning this test can be used) for the duration of the COVID-19 declaration under Section 564(b)(1) of the Act, 21 U.S.C. section 360bbb-3(b)(1), unless the authorization is terminated or revoked.  Performed at [facility], [address], [place], [place] [address]   CULTURE, BLOOD (ROUTINE X 2) w Reflex to ID Panel     Status: None   Collection Time: 02/04/21 11:40 PM   Specimen: BLOOD  Result Value Ref Range Status   Specimen Description BLOOD  LEFT ASSIST CONTROL  Final   Special Requests   Final    BOTTLES DRAWN AEROBIC AND ANAEROBIC Blood Culture adequate volume   Culture   Final    NO GROWTH 5 DAYS Performed at [facility], [address] [place], [place] [address]    Report Status 02/10/2021 FINAL  Final  CULTURE, BLOOD (ROUTINE X 2) w Reflex to ID Panel     Status: None   Collection Time: 02/04/21 11:40 PM   Specimen: BLOOD  Result Value Ref Range Status   Specimen Description BLOOD LEFT HAND  Final   Special Requests   Final    BOTTLES DRAWN AEROBIC AND ANAEROBIC Blood Culture adequate volume   Culture   Final    NO GROWTH 5 DAYS Performed at [facility], [address], [place], [place] [address]    Report Status 02/10/2021 FINAL  Final  MRSA Next Gen by PCR, Nasal     Status: None   Collection Time: 02/05/21  1:24 AM   Specimen: Nasal Mucosa; Nasal Swab  Result Value Ref Range Status   MRSA by PCR Next Gen NOT DETECTED NOT DETECTED Final    Comment: (NOTE) The GeneXpert MRSA Assay (FDA approved for NASAL specimens only), is one component of a comprehensive MRSA colonization surveillance program. It is not intended to diagnose MRSA infection nor to guide or monitor treatment for MRSA infections. Test performance is not FDA approved in patients less than 76 years old. Performed at [facility], [address] [place], [place] [address]   Surgical pcr screen     Status: None  Collection Time: 02/08/21 10:14 PM   Specimen: Nasal Mucosa; Nasal Swab  Result Value Ref Range Status   MRSA, PCR NEGATIVE NEGATIVE Final   Staphylococcus aureus NEGATIVE NEGATIVE Final    Comment: (NOTE) The Xpert SA Assay (FDA approved for NASAL specimens in patients 9 years of age and older), is one component of a comprehensive surveillance program. It is not intended to diagnose infection nor to guide or monitor treatment. Performed at [facility], [place] [address], [place],  [place] [address]   Aerobic/Anaerobic Culture w Gram Stain (surgical/deep wound)     Status: None (Preliminary result)   Collection Time: 02/09/21  5:11 PM   Specimen: Wound; Tissue  Result Value Ref Range Status   Specimen Description WOUND RIGHT FINGER  Final   Special Requests SPEC A  Final   Gram Stain   Final    NO SQUAMOUS EPITHELIAL CELLS SEEN FEW WBC SEEN NO ORGANISMS SEEN Performed at [facility], [address], [place], [place] [address]    Culture PENDING  Incomplete   Report Status PENDING  Incomplete  Aerobic/Anaerobic Culture w Gram Stain (surgical/deep wound)     Status: None (Preliminary result)   Collection Time: 02/09/21  5:15 PM   Specimen: Wound; Tissue  Result Value Ref Range Status   Specimen Description TISSUE RIGHT FINGER  Final   Special Requests SPEC B  Final   Gram Stain   Final    NO SQUAMOUS EPITHELIAL CELLS SEEN FEW WBC SEEN NO ORGANISMS SEEN Performed at [facility], [address], [place], [place] [address]    Culture PENDING  Incomplete   Report Status PENDING  Incomplete    SIGNED:   Charlynne Cousins, MD  [facility] 02/10/2021, 8:19 AM Pager   If 7PM-7AM, please contact night-coverage [email or web address] Password [name]

## 2021-02-10 NOTE — Progress Notes (Signed)
Patient waiting for HD prior to discharge however nephrology was unaware of patient's admission to [facility]. Renal Navigator was able to set up a chair time with the patient's clinic at 10:15 tomorrow morning. The patient is agreeable to go to dialysis tomorrow and then resume his normal schedule on Saturday. Nephrologist Coladonato is agreeable with this plan, and instructs Vanc to be held and given with HD tomorrow.

## 2021-02-10 NOTE — Progress Notes (Signed)
° °  Subjective:  Patient resting comfortably this AM.  Feels much better than yesterday.  Pain in hand well controlled and improved.  Anxious to go home.   Objective:   VITALS:   Vitals:   02/09/21 2100 02/10/21 0032 02/10/21 0440 02/10/21 0740  BP: (!) 127/55 (!) 141/61 (!) 150/73 (!) 167/68  Pulse: 64 62 62 62  Resp: 16 15 14 15   Temp: 98.2 F (36.8 C)  98.3 F (36.8 C) 98.6 F (37 C)  TempSrc: Oral  Oral Oral  SpO2: 100% 100% 99% 100%    Gen: Resting comfortably Pulm: Normal WOB on RA CV: BUE warm and well perfused.  Fingers on right hand are warm w/ intact cap refill.  RUE: Dressing clean and dry.     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   Pt going home today following dialysis Intraop cultures with NGTD and negative gram stain Rec DC on PO abx Pt feeling much improved today Remainder of plan per primary team    Xana Bradt 02/10/2021, 11:19 AM [phone]

## 2021-02-10 NOTE — Anesthesia Postprocedure Evaluation (Signed)
Anesthesia Post Note  Patient: [name].  Procedure(s) Performed: AMPUTATION DIGIT (Right: Ring Finger)     Patient location during evaluation: PACU Anesthesia Type: General Level of consciousness: awake and alert Pain management: pain level controlled Vital Signs Assessment: post-procedure vital signs reviewed and stable Respiratory status: spontaneous breathing, nonlabored ventilation and respiratory function stable Cardiovascular status: stable and blood pressure returned to baseline Anesthetic complications: no   No notable events documented.  Last Vitals:  Vitals:   02/10/21 0440 02/10/21 0740  BP: (!) 150/73 (!) 167/68  Pulse: 62 62  Resp: 14 15  Temp: 36.8 C 37 C  SpO2: 99% 100%    Last Pain:  Vitals:   02/10/21 0740  TempSrc: Oral  PainSc:                  Audry Pili

## 2021-02-12 ENCOUNTER — Emergency Department (HOSPITAL_COMMUNITY): Payer: Medicare Other

## 2021-02-12 ENCOUNTER — Encounter (HOSPITAL_COMMUNITY): Payer: Self-pay

## 2021-02-12 ENCOUNTER — Other Ambulatory Visit: Payer: Self-pay

## 2021-02-12 ENCOUNTER — Observation Stay (HOSPITAL_COMMUNITY)
Admission: EM | Admit: 2021-02-12 | Discharge: 2021-02-13 | Disposition: A | Discharge status: Home / Self Care | Payer: Medicare Other | Attending: Internal Medicine | Admitting: Internal Medicine

## 2021-02-12 DIAGNOSIS — D509 Iron deficiency anemia, unspecified: Secondary | ICD-10-CM | POA: Insufficient documentation

## 2021-02-12 DIAGNOSIS — E8809 Other disorders of plasma-protein metabolism, not elsewhere classified: Secondary | ICD-10-CM | POA: Diagnosis not present

## 2021-02-12 DIAGNOSIS — R4781 Slurred speech: Secondary | ICD-10-CM | POA: Diagnosis present

## 2021-02-12 DIAGNOSIS — R2689 Other abnormalities of gait and mobility: Secondary | ICD-10-CM | POA: Diagnosis not present

## 2021-02-12 DIAGNOSIS — M869 Osteomyelitis, unspecified: Secondary | ICD-10-CM | POA: Diagnosis present

## 2021-02-12 DIAGNOSIS — R791 Abnormal coagulation profile: Secondary | ICD-10-CM | POA: Insufficient documentation

## 2021-02-12 DIAGNOSIS — Z79899 Other long term (current) drug therapy: Secondary | ICD-10-CM | POA: Insufficient documentation

## 2021-02-12 DIAGNOSIS — R471 Dysarthria and anarthria: Secondary | ICD-10-CM

## 2021-02-12 DIAGNOSIS — N186 End stage renal disease: Secondary | ICD-10-CM | POA: Diagnosis not present

## 2021-02-12 DIAGNOSIS — I480 Paroxysmal atrial fibrillation: Secondary | ICD-10-CM | POA: Diagnosis not present

## 2021-02-12 DIAGNOSIS — Z7901 Long term (current) use of anticoagulants: Secondary | ICD-10-CM | POA: Insufficient documentation

## 2021-02-12 DIAGNOSIS — Z7902 Long term (current) use of antithrombotics/antiplatelets: Secondary | ICD-10-CM | POA: Insufficient documentation

## 2021-02-12 DIAGNOSIS — E1122 Type 2 diabetes mellitus with diabetic chronic kidney disease: Secondary | ICD-10-CM | POA: Diagnosis not present

## 2021-02-12 DIAGNOSIS — I509 Heart failure, unspecified: Secondary | ICD-10-CM | POA: Insufficient documentation

## 2021-02-12 DIAGNOSIS — I132 Hypertensive heart and chronic kidney disease with heart failure and with stage 5 chronic kidney disease, or end stage renal disease: Secondary | ICD-10-CM | POA: Diagnosis not present

## 2021-02-12 DIAGNOSIS — Z992 Dependence on renal dialysis: Secondary | ICD-10-CM | POA: Diagnosis not present

## 2021-02-12 DIAGNOSIS — I2510 Atherosclerotic heart disease of native coronary artery without angina pectoris: Secondary | ICD-10-CM | POA: Diagnosis not present

## 2021-02-12 DIAGNOSIS — I639 Cerebral infarction, unspecified: Principal | ICD-10-CM

## 2021-02-12 DIAGNOSIS — Z20822 Contact with and (suspected) exposure to covid-19: Secondary | ICD-10-CM | POA: Insufficient documentation

## 2021-02-12 DIAGNOSIS — Y900 Blood alcohol level of less than 20 mg/100 ml: Secondary | ICD-10-CM | POA: Insufficient documentation

## 2021-02-12 DIAGNOSIS — E778 Other disorders of glycoprotein metabolism: Secondary | ICD-10-CM | POA: Insufficient documentation

## 2021-02-12 DIAGNOSIS — I10 Essential (primary) hypertension: Secondary | ICD-10-CM | POA: Diagnosis present

## 2021-02-12 DIAGNOSIS — E119 Type 2 diabetes mellitus without complications: Secondary | ICD-10-CM

## 2021-02-12 LAB — BASIC METABOLIC PANEL
Anion gap: 13 (ref 5–15)
BUN: 47 mg/dL — ABNORMAL HIGH (ref 6–20)
CO2: 19 mmol/L — ABNORMAL LOW (ref 22–32)
Calcium: 8.2 mg/dL — ABNORMAL LOW (ref 8.9–10.3)
Chloride: 112 mmol/L — ABNORMAL HIGH (ref 98–111)
Creatinine, Ser: 11.59 mg/dL — ABNORMAL HIGH (ref 0.61–1.24)
GFR, Estimated: 5 mL/min — ABNORMAL LOW (ref >60)
Glucose, Bld: 80 mg/dL (ref 70–99)
Potassium: 3.9 mmol/L (ref 3.5–5.1)
Sodium: 144 mmol/L (ref 135–145)

## 2021-02-12 LAB — CBC WITH DIFFERENTIAL/PLATELET
Abs Immature Granulocytes: 0.14 10*3/uL — ABNORMAL HIGH (ref 0.00–0.07)
Basophils Absolute: 0.1 10*3/uL (ref 0.0–0.1)
Basophils Relative: 1 %
Eosinophils Absolute: 0.4 10*3/uL (ref 0.0–0.5)
Eosinophils Relative: 3 %
HCT: 26.6 % — ABNORMAL LOW (ref 39.0–52.0)
Hemoglobin: 7.7 g/dL — ABNORMAL LOW (ref 13.0–17.0)
Immature Granulocytes: 1 %
Lymphocytes Relative: 14 %
Lymphs Abs: 1.4 10*3/uL (ref 0.7–4.0)
MCH: 24.2 pg — ABNORMAL LOW (ref 26.0–34.0)
MCHC: 28.9 g/dL — ABNORMAL LOW (ref 30.0–36.0)
MCV: 83.6 fL (ref 80.0–100.0)
Monocytes Absolute: 1.4 10*3/uL — ABNORMAL HIGH (ref 0.1–1.0)
Monocytes Relative: 13 %
Neutro Abs: 7.1 10*3/uL (ref 1.7–7.7)
Neutrophils Relative %: 68 %
Platelets: 199 10*3/uL (ref 150–400)
RBC: 3.18 MIL/uL — ABNORMAL LOW (ref 4.22–5.81)
RDW: 18.3 % — ABNORMAL HIGH (ref 11.5–15.5)
WBC: 10.5 10*3/uL (ref 4.0–10.5)
nRBC: 0 % (ref 0.0–0.2)

## 2021-02-12 LAB — I-STAT CHEM 8, ED
BUN: 42 mg/dL — ABNORMAL HIGH (ref 6–20)
Calcium, Ion: 1.11 mmol/L — ABNORMAL LOW (ref 1.15–1.40)
Chloride: 115 mmol/L — ABNORMAL HIGH (ref 98–111)
Creatinine, Ser: 12.1 mg/dL — ABNORMAL HIGH (ref 0.61–1.24)
Glucose, Bld: 73 mg/dL (ref 70–99)
HCT: 25 % — ABNORMAL LOW (ref 39.0–52.0)
Hemoglobin: 8.5 g/dL — ABNORMAL LOW (ref 13.0–17.0)
Potassium: 4 mmol/L (ref 3.5–5.1)
Sodium: 146 mmol/L — ABNORMAL HIGH (ref 135–145)
TCO2: 21 mmol/L — ABNORMAL LOW (ref 22–32)

## 2021-02-12 LAB — PROTIME-INR
INR: 1.2 (ref 0.8–1.2)
Prothrombin Time: 15.4 seconds — ABNORMAL HIGH (ref 11.4–15.2)

## 2021-02-12 LAB — APTT: aPTT: 39 seconds — ABNORMAL HIGH (ref 24–36)

## 2021-02-12 LAB — METHYLMALONIC ACID, SERUM: Methylmalonic Acid, Quantitative: 687 nmol/L — ABNORMAL HIGH (ref 0–378)

## 2021-02-12 NOTE — ED Triage Notes (Signed)
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

## 2021-02-12 NOTE — ED Provider Notes (Signed)
[facility] Provider Note   CSN: [id number] Arrival date & time: 02/12/21  2224     History Chief Complaint  Patient presents with   Aphasia    [name]. is a 50 y.o. male.  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  Last seen normal was approximately 5 PM on December 21.  Patient states [name] is having difficulty sitting what [name] wants to say difficulty getting his words out but knows what [name] wants to say.  [name] denies any weakness in his arms or legs.  Denies any headache or visual changes.  Denies any fevers.  His last dialysis session was yesterday but [name] normally goes Tuesday Thursday and Saturday.  His dialysis schedule was modified with his of the hospital.  [name] is currently on Bactrim for his hand osteomyelitis.  They deny any fever since discharge. Denies any numbness or tingling.  No weakness in his arms or legs.  [name] states [name] is having stuttering speech and difficulty getting his words out and his wife confirms this  The history is provided by the patient and the spouse.      Past Medical History:  Diagnosis Date   Anxiety    Cellulitis    CHF (congestive heart failure) (HCC)    Chronic diarrhea    Chronic kidney disease    Chronic pain of both ankles    Diabetes mellitus without complication (HCC)    Diabetes mellitus, type II ([place])    Diabetic neuropathy (HCC)    Frequent falls    Gait instability    GERD (gastroesophageal reflux disease)    Heart palpitations    Hemodialysis patient ([place])    History of kidney stones    HLD (hyperlipidemia)    Hypertension    Insomnia    Nausea and vomiting in adult    recurrent    Patient Active Problem List   Diagnosis Date Noted   Steal syndrome as complication of dialysis access ([place]) 02/06/2021   Folate deficiency 02/06/2021   Hyperkalemia 02/05/2021    Hyperphosphatemia 02/05/2021   Hypermagnesemia 02/05/2021   Coronary artery disease 02/05/2021   Hypernatremia 02/05/2021   Osteomyelitis ([place]) 02/05/2021   Wound infection 02/04/2021   Infected wound 01/29/2021   Wound dehiscence, surgical, initial encounter 01/29/2021   ESRD on hemodialysis (HCC)    Prolonged Q-T interval on ECG    Anemia due to chronic kidney disease, on chronic dialysis (HCC)    Atrial fibrillation with RVR ([place]) 12/08/2020   Finger amputation, 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 75/17/0017   Atrial fibrillation with rapid ventricular response ([place])    Gangrene of finger of right hand ([place]) 11/25/2020   Postural dizziness with presyncope 11/24/2020   Elevated troponin 11/24/2020   Left leg cellulitis 11/24/2020   Frequent falls 11/24/2020   Osteomyelitis of finger of right hand ([place]) 11/24/2020   AF (paroxysmal atrial fibrillation) ([place]) 11/24/2020   Hypotension 11/24/2020   NSTEMI (non-ST elevated myocardial infarction) ([place]) 11/24/2020   Abnormal gait 08/19/2020   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[place]) 08/19/2020   Anxiety state 08/19/2020   Callosity 08/19/2020   ED (erectile dysfunction) of organic origin 08/19/2020   Heart failure ([place]) 08/19/2020   Long term (current) use of insulin ([place]) 08/19/2020   Myalgia 08/19/2020   Obstructive uropathy 08/19/2020   Reactive depression 08/19/2020   History of ST elevation  myocardial infarction (STEMI) 02/2020 s/p DES to LAD 08/19/2020   ESRD (end stage renal disease) ([place]) 08/19/2020   Blindness of left eye 08/15/2020   Combined forms of age-related cataract of right eye 04/17/2020   Supraventricular tachycardia ([place]) 02/26/2020   Acute anterior wall MI ([place])    Benign prostatic hyperplasia with lower urinary tract symptoms 11/07/2019   Fluid overload, unspecified 08/15/2019   Hypertension secondary to other renal disorders 07/20/2019   NVG (neovascular glaucoma), left, 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   Anemia 06/05/2019    Aftercare including intermittent dialysis ([place]) 05/15/2019   Moderate protein-calorie malnutrition ([place]) 05/14/2019   Anaphylactic reaction due to adverse effect of correct drug or medicament properly administered, initial encounter 05/08/2019   Chronic anemia 05/07/2019   Chest pain, unspecified 05/07/2019   Coagulation defect, unspecified ([place]) 05/07/2019   Disorder of phosphorus metabolism, unspecified 05/07/2019   Encounter for fitting and adjustment of extracorporeal dialysis catheter ([place]) 05/07/2019   Gastroparesis 05/07/2019   Iron deficiency anemia, unspecified 05/07/2019   Major depressive disorder, recurrent, unspecified ([place]) 05/07/2019   Pain, unspecified 05/07/2019   Pruritus, unspecified 05/07/2019   Dizziness 35/45/6256   Metabolic acidosis 38/93/7342   Syncope 04/06/2019   Urinary retention 04/06/2019   Hypertensive chronic kidney disease with stage 1 through stage 4 chronic kidney disease, or unspecified chronic kidney disease 11/28/2018   Edema 11/28/2018   Hypokalemia 11/28/2018   Hypo-osmolality and hyponatremia 11/28/2018   Proteinuria 11/28/2018   Secondary hyperparathyroidism of renal origin ([place]) 11/28/2018   Lymphedema 01/09/2018   Chronic diastolic heart failure ([place]) 12/13/2017   Vomiting 11/28/2017   Acute on chronic heart failure ([place]) 11/23/2017   Diabetes mellitus, type II ([place]) 11/23/2017   Diabetic retinopathy ([name]) 07/05/2017   Uncontrolled type 2 diabetes mellitus with hyperglycemia ([place]) 07/08/2016   Uncontrolled type 2 diabetes mellitus with hyperglycemia, with long-term current use of insulin ([place]) 04/20/2016   Osteomyelitis of toe of left foot ([place]) 03/15/2016   Acute renal failure with tubular necrosis ([place]) 06/23/2015   Diarrhea 06/23/2015   Mixed hyperlipidemia    Essential hypertension, benign    Heart palpitations 05/06/2015   Sepsis ([place]) 03/07/2015    Past Surgical History:  Procedure Laterality Date   AMPUTATION Left 03/16/2016    Procedure: AMPUTATION DIGIT LEFT HALLUX;  Surgeon: Trula Slade, DPM;  Location: [facility];  Service: Podiatry;  Laterality: Left;  can start around 5    AMPUTATION Right 02/09/2021   Procedure: AMPUTATION DIGIT;  Surgeon: Sherilyn Cooter, MD;  Location: [facility];  Service: Orthopedics;  Laterality: Right;   ARTHROSCOPIC REPAIR ACL Left    AV FISTULA PLACEMENT Right 05/31/2019   Procedure: Brachiocephalic AV fistula creation;  Surgeon: Algernon Huxley, MD;  Location: [name];  Service: Vascular;  Laterality: Right;   COLONOSCOPY WITH PROPOFOL N/A 10/28/2015   Procedure: COLONOSCOPY WITH PROPOFOL;  Surgeon: Lollie Sails, MD;  Location: [facility];  Service: Endoscopy;  Laterality: N/A;   COLONOSCOPY WITH PROPOFOL N/A 10/29/2015   Procedure: COLONOSCOPY WITH PROPOFOL;  Surgeon: Lollie Sails, MD;  Location[facility];  Service: Endoscopy;  Laterality: N/A;   CORONARY/GRAFT ACUTE MI REVASCULARIZATION N/A 02/26/2020   Procedure: Coronary/Graft Acute MI Revascularization;  Surgeon: Jettie Booze, MD;  Location: [facility];  Service: Cardiovascular;  Laterality: N/A;   DIALYSIS/PERMA CATHETER INSERTION Right 04/26/2019   Perm Cath    DIALYSIS/PERMA CATHETER INSERTION N/A 04/26/2019   Procedure: DIALYSIS/PERMA CATHETER INSERTION;  Surgeon: Algernon Huxley, MD;  Location: [facility];  Service: Cardiovascular;  Laterality: N/A;   DIALYSIS/PERMA CATHETER REMOVAL N/A 08/15/2019   Procedure: DIALYSIS/PERMA CATHETER REMOVAL;  Surgeon: Katha Cabal, MD;  Location: [facility];  Service: Cardiovascular;  Laterality: N/A;   EMBOLIZATION Right 02/06/2021   Procedure: EMBOLIZATION;  Surgeon: Algernon Huxley, MD;  Location: [facility];  Service: Cardiovascular;  Laterality: Right;  Right Upper Extremity Dialysis Access, Permcath Placement   ESOPHAGOGASTRODUODENOSCOPY (EGD) WITH PROPOFOL N/A 12/27/2017   Procedure: ESOPHAGOGASTRODUODENOSCOPY (EGD) WITH PROPOFOL;   Surgeon: Toledo, Benay Pike, MD;  Location: [name] ENDOSCOPY;  Service: Gastroenterology;  Laterality: N/A;   INTRAVASCULAR ULTRASOUND/IVUS N/A 02/26/2020   Procedure: Intravascular Ultrasound/IVUS;  Surgeon: Jettie Booze, MD;  Location: [facility];  Service: Cardiovascular;  Laterality: N/A;   LEFT HEART CATH AND CORONARY ANGIOGRAPHY N/A 02/26/2020   Procedure: LEFT HEART CATH AND CORONARY ANGIOGRAPHY;  Surgeon: Jettie Booze, MD;  Location: [facility];  Service: Cardiovascular;  Laterality: N/A;   PROSTATE SURGERY  2016   TONSILECTOMY/ADENOIDECTOMY WITH MYRINGOTOMY     TONSILLECTOMY     UPPER EXTREMITY ANGIOGRAPHY Right 11/26/2020   Procedure: Upper Extremity Angiography;  Surgeon: Algernon Huxley, MD;  Location: [facility];  Service: Cardiovascular;  Laterality: Right;   UPPER EXTREMITY ANGIOGRAPHY Right 02/05/2021   Procedure: UPPER EXTREMITY ANGIOGRAPHY;  Surgeon: Algernon Huxley, MD;  Location: [facility] CV LAB;  Service: Cardiovascular;  Laterality: Right;       Family History  Problem Relation Age of Onset   CAD Father    Stroke Father    Diabetes Mellitus II Mother    Kidney failure Mother    Schizophrenia Mother     Social History   Tobacco Use   Smoking status: Never   Smokeless tobacco: Never  Vaping Use   Vaping Use: Never used  Substance Use Topics   Alcohol use: No   Drug use: No    Home Medications Prior to Admission medications   Medication Sig Start Date End Date Taking? Authorizing Provider  acetaminophen (TYLENOL) 325 MG tablet Take 2 tablets (650 mg total) by mouth every 6 (six) hours as needed for mild pain (or Fever >/= 101). 01/30/21   Richarda Osmond, MD  amiodarone (PACERONE) 200 MG tablet Take 1 tablet (200 mg total) by mouth daily 02/10/21   Charlynne Cousins, MD  amLODipine (NORVASC) 5 MG tablet Take 1 tablet (5 mg total) by mouth daily. 02/10/21   Charlynne Cousins, MD  apixaban (ELIQUIS) 5 MG TABS tablet  Take 1 tablet (5 mg total) by mouth 2 (two) times daily. 02/10/21   Charlynne Cousins, MD  artificial tears (LACRILUBE) OINT ophthalmic ointment Place into the left eye 4 (four) times daily. 02/08/21   Elodia Florence., MD  atorvastatin (LIPITOR) 80 MG tablet TAKE 1 TABLET (80 MG TOTAL) BY MOUTH AT BEDTIME. 02/28/20 02/27/21  Reino Bellis B, NP  azelastine (ASTELIN) 0.1 % nasal spray Place 2 sprays into both nostrils 2 (two) times daily. 09/08/20   [provider]  calcium acetate (PHOSLO) 667 MG capsule Take 3 capsules (2,001 mg total) by mouth 3 (three) times daily with meals. 11/27/20   Sharen Hones, MD  carvedilol (COREG) 25 MG tablet Take 1 tablet (25 mg total) by mouth 2 (two) times daily with a meal. 02/10/21   Charlynne Cousins, MD  clopidogrel (PLAVIX) 75 MG tablet Take 1 tablet (75 mg  total) by mouth daily. 02/10/21   Charlynne Cousins, MD  FEROSUL 325 (65 Fe) MG tablet Take 325 mg by mouth daily. 09/08/20   [provider]  fluticasone (FLONASE) 50 MCG/ACT nasal spray Place 2 sprays into both nostrils daily. Patient not taking: Reported on 02/04/2021 09/08/20   [provider]  folic acid (FOLVITE) 1 MG tablet Take 1 tablet (1 mg total) by mouth daily. 02/09/21 03/11/21  Elodia Florence., MD  HYDROcodone-acetaminophen (NORCO/VICODIN) 5-325 MG tablet Take 2 tablets by mouth 2 (two) times daily. 01/23/21   [provider]  nitroGLYCERIN (NITROSTAT) 0.4 MG SL tablet Place 1 tablet (0.4 mg total) under the tongue every 5 (five) minutes as needed for chest pain. 12/09/20 12/09/21  Johnson, Clanford L, MD  prednisoLONE acetate (PRED FORTE) 1 % ophthalmic suspension Place 1 drop into the left eye every 4 (four) hours.    [provider]  sulfamethoxazole-trimethoprim (BACTRIM DS) 800-160 MG tablet Take 1 tablet by mouth every other day. 02/10/21   Charlynne Cousins, MD  tamsulosin (FLOMAX) 0.4 MG CAPS capsule Take 0.4 mg by mouth daily  after breakfast.     [provider]  vitamin B-12 1000 MCG tablet Take 1 tablet (1,000 mcg total) by mouth daily. 02/09/21 03/11/21  Elodia Florence., MD    Allergies    Levofloxacin, Promethazine, Other, and Malt  Review of Systems   Review of Systems  Constitutional:  Negative for activity change, appetite change and fever.  Respiratory:  Negative for cough, chest tightness and shortness of breath.   Cardiovascular:  Negative for chest pain.  Gastrointestinal:  Negative for abdominal pain, nausea and vomiting.  Genitourinary:  Negative for dysuria and hematuria.  Musculoskeletal:  Positive for arthralgias and myalgias.  Skin:  Negative for rash.  Neurological:  Positive for speech difficulty. Negative for dizziness and headaches.   all other systems are negative except as noted in the HPI and PMH.   Physical Exam Updated Vital Signs BP (!) 158/78 (BP Location: Left Arm)    Pulse 67    Temp 98.4 F (36.9 C) (Oral)    Ht 6\' 3"  (1.905 m)    Wt 93 kg    SpO2 99%    BMI 25.62 kg/m   Physical Exam Vitals and nursing note reviewed.  Constitutional:      General: [name] is not in acute distress.    Appearance: [name] is well-developed. [name] is not ill-appearing.     Comments: Stuttering and dysarthric speech.  Oriented x3  HENT:     Head: Normocephalic and atraumatic.     Mouth/Throat:     Pharynx: No oropharyngeal exudate.  Eyes:     Conjunctiva/sclera: Conjunctivae normal.     Pupils: Pupils are equal, round, and reactive to light.     Comments: L eye prosthesis  Neck:     Comments: No meningismus. Cardiovascular:     Rate and Rhythm: Normal rate and regular rhythm.     Heart sounds: Normal heart sounds. No murmur heard. Pulmonary:     Effort: Pulmonary effort is normal. No respiratory distress.     Breath sounds: Normal breath sounds.     Comments: Dialysis catheter left chest. Chest:     Chest wall: No tenderness.  Abdominal:     Palpations: Abdomen is soft.      Tenderness: There is no abdominal tenderness. There is no guarding or rebound.  Musculoskeletal:  General: No tenderness. Normal range of motion.     Cervical back: Normal range of motion and neck supple.     Comments: Dressing in place to right hand.  Skin:    General: Skin is warm.  Neurological:     Mental Status: [name] is alert and oriented to person, place, and time.     Cranial Nerves: No cranial nerve deficit.     Motor: No abnormal muscle tone.     Coordination: Coordination normal.     Comments:  5/5 strength throughout. CN 2-12 intact.Equal grip strength.  Intermittent slurred and stuttering speech.  Speech content is normal  Psychiatric:        Behavior: Behavior normal.    ED Results / Procedures / Treatments   Labs (all labs ordered are listed, but only abnormal results are displayed) Labs Reviewed  CBC WITH DIFFERENTIAL/PLATELET - Abnormal; Notable for the following components:      Result Value   RBC 3.18 (*)    Hemoglobin 7.7 (*)    HCT 26.6 (*)    MCH 24.2 (*)    MCHC 28.9 (*)    RDW 18.3 (*)    Monocytes Absolute 1.4 (*)    Abs Immature Granulocytes 0.14 (*)    All other components within normal limits  BASIC METABOLIC PANEL - Abnormal; Notable for the following components:   Chloride 112 (*)    CO2 19 (*)    BUN 47 (*)    Creatinine, Ser 11.59 (*)    Calcium 8.2 (*)    GFR, Estimated 5 (*)    All other components within normal limits  PROTIME-INR - Abnormal; Notable for the following components:   Prothrombin Time 15.4 (*)    All other components within normal limits  APTT - Abnormal; Notable for the following components:   aPTT 39 (*)    All other components within normal limits  I-STAT CHEM 8, ED - Abnormal; Notable for the following components:   Sodium 146 (*)    Chloride 115 (*)    BUN 42 (*)    Creatinine, Ser 12.10 (*)    Calcium, Ion 1.11 (*)    TCO2 21 (*)    Hemoglobin 8.5 (*)    HCT 25.0 (*)    All other components within normal  limits  RESP PANEL BY RT-PCR (FLU A&B, COVID) ARPGX2  CULTURE, BLOOD (ROUTINE X 2)  CULTURE, BLOOD (ROUTINE X 2)  ETHANOL  RAPID URINE DRUG SCREEN, HOSP PERFORMED  URINALYSIS, ROUTINE W REFLEX MICROSCOPIC  LACTIC ACID, PLASMA  LACTIC ACID, PLASMA    EKG EKG Interpretation  Date/Time:  Thursday February 12 2021 22:54:52 EST Ventricular Rate:  69 PR Interval:  168 QRS Duration: 104 QT Interval:  461 QTC Calculation: 494 R Axis:   3 Text Interpretation: Sinus rhythm Minimal ST elevation, anterior leads Borderline prolonged QT interval No significant change was found Confirmed by Ezequiel Essex [phone]) on 02/13/2021 1:01:54 AM  Radiology CT Head Wo Contrast  Result Date: 02/12/2021 CLINICAL DATA:  Mental status change.  Slurred speech. EXAM: CT HEAD WITHOUT CONTRAST TECHNIQUE: Contiguous axial images were obtained from the base of the skull through the vertex without intravenous contrast. COMPARISON:  CT head 01/08/2021. FINDINGS: Brain: No evidence of acute infarction, hemorrhage, hydrocephalus, extra-axial collection or mass lesion/mass effect. Vascular: No hyperdense vessel or unexpected calcification. Skull: Normal. Negative for fracture or focal lesion. Sinuses/Orbits: No acute finding. Other: Peripheral vascular calcifications are again seen. Occipital skin thickening is unchanged.  IMPRESSION: No acute intracranial abnormality. Electronically Signed   By: Ronney Asters M.D.   On: 02/12/2021 23:26    Procedures Procedures   Medications Ordered in ED Medications - No data to display  ED Course  I have reviewed the triage vital signs and the nursing notes.  Pertinent labs & imaging results that were available during my care of the patient were reviewed by me and considered in my medical decision making (see chart for details).    MDM Rules/Calculators/A&P                         Speech difficulty for greater than 24 hours.  Concern for possible stroke.  Recent amputation  of finger.  Currently on Bactrim  Code stroke not activated due to delay in presentation. Last normal greater than 24 hours ago. [name] is on eliquis.   Labs show stable anemia  Potassium is normal.  Creatinine is 12.  No indication for emergent dialysis.  CT head shows no acute pathology.  Patient still with dysarthria, stuttering, and slurred speech with concern for underlying CVA.  MRI not available at this time  Plan admission for MRI in the morning and stroke work-up and further evaluation of patient's slurred speech.  D/w Dr. Clearence Ped.    Final Clinical Impression(s) / ED Diagnoses Final diagnoses:  Dysarthria  CVA (cerebral vascular accident) [facility])    Rx / [address] Orders ED Discharge Orders     None        Flower Franko, Annie Main, MD 02/13/21 0225

## 2021-02-13 ENCOUNTER — Observation Stay (HOSPITAL_COMMUNITY): Payer: Medicare Other

## 2021-02-13 ENCOUNTER — Observation Stay (HOSPITAL_COMMUNITY)
Admit: 2021-02-13 | Discharge: 2021-02-13 | Disposition: A | Discharge status: Home / Self Care | Payer: Medicare Other | Attending: Neurology | Admitting: Neurology

## 2021-02-13 DIAGNOSIS — I25119 Atherosclerotic heart disease of native coronary artery with unspecified angina pectoris: Secondary | ICD-10-CM | POA: Diagnosis not present

## 2021-02-13 DIAGNOSIS — I480 Paroxysmal atrial fibrillation: Secondary | ICD-10-CM | POA: Diagnosis not present

## 2021-02-13 DIAGNOSIS — I639 Cerebral infarction, unspecified: Secondary | ICD-10-CM | POA: Diagnosis present

## 2021-02-13 DIAGNOSIS — M869 Osteomyelitis, unspecified: Secondary | ICD-10-CM | POA: Diagnosis not present

## 2021-02-13 DIAGNOSIS — R479 Unspecified speech disturbances: Secondary | ICD-10-CM

## 2021-02-13 DIAGNOSIS — N186 End stage renal disease: Secondary | ICD-10-CM | POA: Diagnosis not present

## 2021-02-13 DIAGNOSIS — I10 Essential (primary) hypertension: Secondary | ICD-10-CM

## 2021-02-13 DIAGNOSIS — Z992 Dependence on renal dialysis: Secondary | ICD-10-CM

## 2021-02-13 DIAGNOSIS — E1122 Type 2 diabetes mellitus with diabetic chronic kidney disease: Secondary | ICD-10-CM

## 2021-02-13 HISTORY — DX: Cerebral infarction, unspecified: I63.9

## 2021-02-13 LAB — COMPREHENSIVE METABOLIC PANEL
ALT: 6 U/L (ref 0–44)
AST: 11 U/L — ABNORMAL LOW (ref 15–41)
Albumin: 2.7 g/dL — ABNORMAL LOW (ref 3.5–5.0)
Alkaline Phosphatase: 62 U/L (ref 38–126)
Anion gap: 13 (ref 5–15)
BUN: 48 mg/dL — ABNORMAL HIGH (ref 6–20)
CO2: 19 mmol/L — ABNORMAL LOW (ref 22–32)
Calcium: 8.3 mg/dL — ABNORMAL LOW (ref 8.9–10.3)
Chloride: 112 mmol/L — ABNORMAL HIGH (ref 98–111)
Creatinine, Ser: 11.42 mg/dL — ABNORMAL HIGH (ref 0.61–1.24)
GFR, Estimated: 5 mL/min — ABNORMAL LOW (ref >60)
Glucose, Bld: 81 mg/dL (ref 70–99)
Potassium: 3.9 mmol/L (ref 3.5–5.1)
Sodium: 144 mmol/L (ref 135–145)
Total Bilirubin: 0.5 mg/dL (ref 0.3–1.2)
Total Protein: 6.2 g/dL — ABNORMAL LOW (ref 6.5–8.1)

## 2021-02-13 LAB — LIPID PANEL
Cholesterol: 84 mg/dL (ref 0–200)
HDL: 32 mg/dL — ABNORMAL LOW (ref >40)
LDL Cholesterol: 38 mg/dL (ref 0–99)
Total CHOL/HDL Ratio: 2.6 RATIO
Triglycerides: 68 mg/dL (ref <150)
VLDL: 14 mg/dL (ref 0–40)

## 2021-02-13 LAB — HEMOGLOBIN A1C
Hgb A1c MFr Bld: 5.7 % — ABNORMAL HIGH (ref 4.8–5.6)
Mean Plasma Glucose: 116.89 mg/dL

## 2021-02-13 LAB — ETHANOL: Alcohol, Ethyl (B): <10 mg/dL (ref <10)

## 2021-02-13 LAB — CBC
HCT: 25.9 % — ABNORMAL LOW (ref 39.0–52.0)
Hemoglobin: 7.5 g/dL — ABNORMAL LOW (ref 13.0–17.0)
MCH: 24.4 pg — ABNORMAL LOW (ref 26.0–34.0)
MCHC: 29 g/dL — ABNORMAL LOW (ref 30.0–36.0)
MCV: 84.1 fL (ref 80.0–100.0)
Platelets: 200 10*3/uL (ref 150–400)
RBC: 3.08 MIL/uL — ABNORMAL LOW (ref 4.22–5.81)
RDW: 18.2 % — ABNORMAL HIGH (ref 11.5–15.5)
WBC: 10.4 10*3/uL (ref 4.0–10.5)
nRBC: 0 % (ref 0.0–0.2)

## 2021-02-13 LAB — RESP PANEL BY RT-PCR (FLU A&B, COVID) ARPGX2
Influenza A by PCR: NEGATIVE
Influenza B by PCR: NEGATIVE
SARS Coronavirus 2 by RT PCR: NEGATIVE

## 2021-02-13 LAB — LACTIC ACID, PLASMA
Lactic Acid, Venous: 0.6 mmol/L (ref 0.5–1.9)
Lactic Acid, Venous: 0.8 mmol/L (ref 0.5–1.9)

## 2021-02-13 LAB — CBG MONITORING, ED: Glucose-Capillary: 93 mg/dL (ref 70–99)

## 2021-02-13 MED ORDER — AMLODIPINE BESYLATE 5 MG PO TABS
5.0000 mg | ORAL_TABLET | Freq: Every day | ORAL | Status: DC
  Administered 2021-02-13: 11:00:00 5 mg via ORAL
  Filled 2021-02-13: qty 1

## 2021-02-13 MED ORDER — APIXABAN 5 MG PO TABS
5.0000 mg | ORAL_TABLET | Freq: Two times a day (BID) | ORAL | Status: DC
  Administered 2021-02-13: 11:00:00 5 mg via ORAL
  Filled 2021-02-13: qty 1

## 2021-02-13 MED ORDER — FERROUS SULFATE 325 (65 FE) MG PO TABS
325.0000 mg | ORAL_TABLET | Freq: Every day | ORAL | Status: DC
  Filled 2021-02-13: qty 1

## 2021-02-13 MED ORDER — STROKE: EARLY STAGES OF RECOVERY BOOK
Freq: Once | Status: DC
  Filled 2021-02-13: qty 1

## 2021-02-13 MED ORDER — CALCIUM ACETATE (PHOS BINDER) 667 MG PO CAPS: 2001.0000 mg | ORAL_CAPSULE | Freq: Three times a day (TID) | ORAL | Status: DC

## 2021-02-13 MED ORDER — INSULIN ASPART 100 UNIT/ML IJ SOLN: 0.0000 [IU] | Freq: Three times a day (TID) | INTRAMUSCULAR | Status: DC

## 2021-02-13 MED ORDER — PANTOPRAZOLE SODIUM 40 MG PO TBEC: 40.0000 mg | DELAYED_RELEASE_TABLET | Freq: Every day | ORAL | Status: DC

## 2021-02-13 MED ORDER — CARVEDILOL 12.5 MG PO TABS
25.0000 mg | ORAL_TABLET | Freq: Two times a day (BID) | ORAL | Status: DC
  Filled 2021-02-13: qty 2

## 2021-02-13 MED ORDER — ATORVASTATIN CALCIUM 40 MG PO TABS: 40.0000 mg | ORAL_TABLET | Freq: Every day | ORAL | Status: DC

## 2021-02-13 MED ORDER — SULFAMETHOXAZOLE-TRIMETHOPRIM 800-160 MG PO TABS
1.0000 | ORAL_TABLET | ORAL | Status: DC
  Administered 2021-02-13: 11:00:00 1 via ORAL
  Filled 2021-02-13: qty 1

## 2021-02-13 MED ORDER — TAMSULOSIN HCL 0.4 MG PO CAPS
0.4000 mg | ORAL_CAPSULE | Freq: Every day | ORAL | Status: DC
  Administered 2021-02-13: 10:00:00 0.4 mg via ORAL
  Filled 2021-02-13: qty 1

## 2021-02-13 MED ORDER — FOLIC ACID 1 MG PO TABS
1.0000 mg | ORAL_TABLET | Freq: Every day | ORAL | Status: DC
  Administered 2021-02-13: 11:00:00 1 mg via ORAL
  Filled 2021-02-13: qty 1

## 2021-02-13 MED ORDER — CLOPIDOGREL BISULFATE 75 MG PO TABS
75.0000 mg | ORAL_TABLET | Freq: Every day | ORAL | Status: DC
  Administered 2021-02-13: 11:00:00 75 mg via ORAL
  Filled 2021-02-13: qty 1

## 2021-02-13 MED ORDER — ACETAMINOPHEN 160 MG/5ML PO SOLN: 650.0000 mg | ORAL | Status: DC | PRN

## 2021-02-13 MED ORDER — APIXABAN 5 MG PO TABS: 5.0000 mg | ORAL_TABLET | Freq: Two times a day (BID) | ORAL | Status: DC

## 2021-02-13 MED ORDER — AMIODARONE HCL 200 MG PO TABS
200.0000 mg | ORAL_TABLET | Freq: Every day | ORAL | Status: DC
  Administered 2021-02-13: 11:00:00 200 mg via ORAL
  Filled 2021-02-13: qty 1

## 2021-02-13 MED ORDER — ACETAMINOPHEN 325 MG PO TABS: 650.0000 mg | ORAL_TABLET | ORAL | Status: DC | PRN

## 2021-02-13 MED ORDER — PANTOPRAZOLE SODIUM 40 MG PO TBEC: 40.0000 mg | DELAYED_RELEASE_TABLET | Freq: Every day | ORAL | 0 refills | Status: DC

## 2021-02-13 MED ORDER — ACETAMINOPHEN 650 MG RE SUPP: 650.0000 mg | RECTAL | Status: DC | PRN

## 2021-02-13 NOTE — Progress Notes (Signed)
°  °  Transition of Care [facility]) Screening Note   Patient Details  Name: [name]. Date of Birth: 03-May-1970   [facility] of [facility]) CM/SW Contact:    Ihor Gully, LCSW Phone Number: 02/13/2021, 2:08 PM    Transition of Care Department [facility]) has reviewed patient and no TOC needs have been identified at this time. We will continue to monitor patient advancement through interdisciplinary progression rounds. If new patient transition needs arise, please place a TOC consult.   Harout Scheurich, Clydene Pugh, LCSW

## 2021-02-13 NOTE — H&P (Signed)
[name] H&P    Patient Demographics:    [name], is a 50 y.o. male  MRN: [id number]  DOB - 02-May-1970  Admit Date - 02/12/2021  Referring MD/NP/PA: Rancour   Outpatient Primary MD for the patient is [name], MD  Patient coming from: home  Chief complaint- change in speech   HPI:    [name]  is a 50 y.o. male, with history of ESRD on hemodialysis, CHF, diabetes mellitus type 2, GERD, gait instability requiring walker, hypertension, hyperlipidemia, A. fib, and more presents ED with a chief complaint of change in speech.  Wife is at bedside and patient requested mostly to be taken from her.  She reports she noticed him slurring his speech and stuttering approximately 24 hours prior to arrival.  The symptoms started at 5 PM on the 21st.  When he went to dialysis he had rigors and [facility] called EMS.  They were concerned that his osteomyelitis was not responding to treatment.  Patient recently had a finger amputated and was started on Bactrim for osteomyelitis.  Wife reports that the change in speech is worse since it started, but intermittent.  When he has changes speech to last minutes, and sometimes it is an hour between episodes.  He has not complained of any headaches.  She has not noticed a facial droop he has not had any choking or swallowing difficulties.  He has not complained of change in vision or hearing.  He has no family or personal history of stroke.  He does have family history of heart disease.  Patient reports compliance with all of his medications.  Patient has no other complaints.  He does not smoke, does not drink alcohol, does not use illicit drugs.  He is not vaccinated for COVID.  Patient is full code.  In the ED Temp 98.4, heart rate 67-73, respiratory rate 16, blood pressure 186/80, satting at 95% No leukocytosis the white blood cell count 10.5, hemoglobin 7.7, hematocrit  26.6 Hemoglobin is at baseline CT head shows no acute intracranial abnormality Chemistry panel shows decreased bicarb, increased BUN, increased creatinine all consistent with ESRD Glucose has been running low from 80-73 Alcohol level is nondetectable UDS, UA, lactate, blood culture, respiratory panel still pending Admission requested to rule out stroke    Review of systems:    In addition to the HPI above,  No Fever-chills, No Headache, No changes with Vision or hearing, No problems swallowing food or Liquids, No Chest pain, Cough or Shortness of Breath, No Abdominal pain, No Nausea or Vomiting, bowel movements are regular, No Blood in stool or Urine, No dysuria, No new skin rashes or bruises, No new joints pains-aches,  No new weakness, tingling, numbness in any extremity,, no change in speech No recent weight gain or loss, No polyuria, polydypsia or polyphagia, No significant Mental Stressors.  All other systems reviewed and are negative.    Past History of the following :    Past Medical History:  Diagnosis Date   Anxiety    Cellulitis  CHF (congestive heart failure) (HCC)    Chronic diarrhea    Chronic kidney disease    Chronic pain of both ankles    Diabetes mellitus without complication (HCC)    Diabetes mellitus, type II (HCC)    Diabetic neuropathy (HCC)    Frequent falls    Gait instability    GERD (gastroesophageal reflux disease)    Heart palpitations    Hemodialysis patient ([place])    History of kidney stones    HLD (hyperlipidemia)    Hypertension    Insomnia    Nausea and vomiting in adult    recurrent      Past Surgical History:  Procedure Laterality Date   AMPUTATION Left 03/16/2016   Procedure: AMPUTATION DIGIT LEFT HALLUX;  Surgeon: Trula Slade, DPM;  Location: [facility];  Service: Podiatry;  Laterality: Left;  can start around 5    AMPUTATION Right 02/09/2021   Procedure: AMPUTATION DIGIT;  Surgeon: Sherilyn Cooter, MD;  Location: [facility];  Service: Orthopedics;  Laterality: Right;   ARTHROSCOPIC REPAIR ACL Left    AV FISTULA PLACEMENT Right 05/31/2019   Procedure: Brachiocephalic AV fistula creation;  Surgeon: Algernon Huxley, MD;  Location: [name];  Service: Vascular;  Laterality: Right;   COLONOSCOPY WITH PROPOFOL N/A 10/28/2015   Procedure: COLONOSCOPY WITH PROPOFOL;  Surgeon: Lollie Sails, MD;  Location: [facility] END[facility];  Service: Endoscopy;  Laterality: N/A;   COLONOSCOPY WITH PROPOFOL N/A 10/29/2015   Procedure: COLONOSCOPY WITH PROPOFOL;  Surgeon: Lollie Sails, MD;  Location: [facility];  Service: Endoscopy;  Laterality: N/A;   CORONARY/GRAFT ACUTE MI REVASCULARIZATION N/A 02/26/2020   Procedure: Coronary/Graft Acute MI Revascularization;  Surgeon: Jettie Booze, MD;  Location: [place] [facility];  Service: Cardiovascular;  Laterality: N/A;   DIALYSIS/PERMA CATHETER INSERTION Right 04/26/2019   Perm Cath    DIALYSIS/PERMA CATHETER INSERTION N/A 04/26/2019   Procedure: DIALYSIS/PERMA CATHETER INSERTION;  Surgeon: Algernon Huxley, MD;  Location: [place] [facility];  Service: Cardiovascular;  Laterality: N/A;   DIALYSIS/PERMA CATHETER REMOVAL N/A 08/15/2019   Procedure: DIALYSIS/PERMA CATHETER REMOVAL;  Surgeon: Katha Cabal, MD;  Location: [place] [facility];  Service: Cardiovascular;  Laterality: N/A;   EMBOLIZATION Right 02/06/2021   Procedure: EMBOLIZATION;  Surgeon: Algernon Huxley, MD;  Location: [place] [facility];  Service: Cardiovascular;  Laterality: Right;  Right Upper Extremity Dialysis Access, Permcath Placement   ESOPHAGOGASTRODUODENOSCOPY (EGD) WITH PROPOFOL N/A 12/27/2017   Procedure: ESOPHAGOGASTRODUODENOSCOPY (EGD) WITH PROPOFOL;  Surgeon: Toledo, Benay Pike, MD;  Location: [name] ENDOSCOPY;  Service: Gastroenterology;  Laterality: N/A;   INTRAVASCULAR ULTRASOUND/IVUS N/A 02/26/2020   Procedure: Intravascular Ultrasound/IVUS;  Surgeon: Jettie Booze, MD;  Location: [place] [facility];   Service: Cardiovascular;  Laterality: N/A;   LEFT HEART CATH AND CORONARY ANGIOGRAPHY N/A 02/26/2020   Procedure: LEFT HEART CATH AND CORONARY ANGIOGRAPHY;  Surgeon: Jettie Booze, MD;  Location: [place] [facility];  Service: Cardiovascular;  Laterality: N/A;   PROSTATE SURGERY  2016   TONSILECTOMY/ADENOIDECTOMY WITH MYRINGOTOMY     TONSILLECTOMY     UPPER EXTREMITY ANGIOGRAPHY Right 11/26/2020   Procedure: Upper Extremity Angiography;  Surgeon: Algernon Huxley, MD;  Location: [place] [facility];  Service: Cardiovascular;  Laterality: Right;   UPPER EXTREMITY ANGIOGRAPHY Right 02/05/2021   Procedure: UPPER EXTREMITY ANGIOGRAPHY;  Surgeon: Algernon Huxley, MD;  Location: [place] [facility];  Service: Cardiovascular;  Laterality: Right;      Social History:  Social History   Tobacco Use   Smoking status: Never   Smokeless tobacco: Never  Substance Use Topics   Alcohol use: No       Family History :     Family History  Problem Relation Age of Onset   CAD Father    Stroke Father    Diabetes Mellitus II Mother    Kidney failure Mother    Schizophrenia Mother       Home Medications:   Prior to Admission medications   Medication Sig Start Date End Date Taking? Authorizing Provider  acetaminophen (TYLENOL) 325 MG tablet Take 2 tablets (650 mg total) by mouth every 6 (six) hours as needed for mild pain (or Fever >/= 101). 01/30/21   Richarda Osmond, MD  amiodarone (PACERONE) 200 MG tablet Take 1 tablet (200 mg total) by mouth daily 02/10/21   Charlynne Cousins, MD  amLODipine (NORVASC) 5 MG tablet Take 1 tablet (5 mg total) by mouth daily. 02/10/21   Charlynne Cousins, MD  apixaban (ELIQUIS) 5 MG TABS tablet Take 1 tablet (5 mg total) by mouth 2 (two) times daily. 02/10/21   Charlynne Cousins, MD  artificial tears (LACRILUBE) OINT ophthalmic ointment Place into the left eye 4 (four) times daily. 02/08/21   Elodia Florence., MD  atorvastatin (LIPITOR) 80  MG tablet TAKE 1 TABLET (80 MG TOTAL) BY MOUTH AT BEDTIME. 02/28/20 02/27/21  Reino Bellis B, NP  azelastine (ASTELIN) 0.1 % nasal spray Place 2 sprays into both nostrils 2 (two) times daily. 09/08/20   [provider]  calcium acetate (PHOSLO) 667 MG capsule Take 3 capsules (2,001 mg total) by mouth 3 (three) times daily with meals. 11/27/20   Sharen Hones, MD  carvedilol (COREG) 25 MG tablet Take 1 tablet (25 mg total) by mouth 2 (two) times daily with a meal. 02/10/21   Charlynne Cousins, MD  clopidogrel (PLAVIX) 75 MG tablet Take 1 tablet (75 mg total) by mouth daily. 02/10/21   Charlynne Cousins, MD  FEROSUL 325 (65 Fe) MG tablet Take 325 mg by mouth daily. 09/08/20   [provider]  fluticasone (FLONASE) 50 MCG/ACT nasal spray Place 2 sprays into both nostrils daily. Patient not taking: Reported on 02/04/2021 09/08/20   [provider]  folic acid (FOLVITE) 1 MG tablet Take 1 tablet (1 mg total) by mouth daily. 02/09/21 03/11/21  Elodia Florence., MD  HYDROcodone-acetaminophen (NORCO/VICODIN) 5-325 MG tablet Take 2 tablets by mouth 2 (two) times daily. 01/23/21   [provider]  nitroGLYCERIN (NITROSTAT) 0.4 MG SL tablet Place 1 tablet (0.4 mg total) under the tongue every 5 (five) minutes as needed for chest pain. 12/09/20 12/09/21  Johnson, Clanford L, MD  prednisoLONE acetate (PRED FORTE) 1 % ophthalmic suspension Place 1 drop into the left eye every 4 (four) hours.    [provider]  sulfamethoxazole-trimethoprim (BACTRIM DS) 800-160 MG tablet Take 1 tablet by mouth every other day. 02/10/21   Charlynne Cousins, MD  tamsulosin (FLOMAX) 0.4 MG CAPS capsule Take 0.4 mg by mouth daily after breakfast.     [provider]  vitamin B-12 1000 MCG tablet Take 1 tablet (1,000 mcg total) by mouth daily. 02/09/21 03/11/21  Elodia Florence., MD     Allergies:     Allergies  Allergen Reactions   Levofloxacin Swelling    Of  face Other reaction(s): swelling   Promethazine Diarrhea and Other (See Comments)  Other reaction(s): Other (See Comments), Other (See Comments) Other reaction(s): Muscle cramps Muscle cramps Cramping Other reaction(s): Muscle cramps Muscle cramps   Other     Other reaction(s): Unknown   Malt Other (See Comments)    Other reaction(s): cramping     Physical Exam:   Vitals  Blood pressure (!) 183/93, pulse 72, temperature 98.4 F (36.9 C), temperature source Oral, resp. rate 16, height 6\' 3"  (1.905 m), weight 93 kg, SpO2 97 %.   1.  General: Patient lying supine in bed,  no acute distress   2. Psychiatric: Alert and oriented x 3, mood and behavior normal for situation, pleasant and cooperative with exam   3. Neurologic: Speech is intermittently normal with some stuttering language is normal, face is symmetric, moves all 4 extremities voluntarily, at baseline without acute deficits on limited exam   4. HEENMT:  Head is atraumatic, normocephalic, pupils reactive to light, neck is supple, trachea is midline, mucous membranes are moist   5. Respiratory : Lungs are clear to auscultation bilaterally without wheezing, rhonchi, rales, no cyanosis, no increase in work of breathing or accessory muscle use   6. Cardiovascular : Heart rate normal, rhythm is regular, no murmurs, rubs or gallops, significant peripheral edema peripheral pulses palpated   7. Gastrointestinal:  Abdomen is soft, nondistended, nontender to palpation bowel sounds active, no masses or organomegaly palpated   8. Skin:  Skin is warm, dry and intact without rashes, acute lesions, or ulcers on limited exam   9.Musculoskeletal:  No acute deformities or trauma, no asymmetry in tone, 2+ peripheral edema, peripheral pulses palpated, no tenderness to palpation in the extremities     Data Review:    CBC Recent Labs  Lab 02/06/21 0433 02/07/21 0332 02/07/21 1628 02/08/21 0757 02/09/21 0221 02/12/21 2303  02/12/21 2326  WBC 11.9* 13.1*  --  15.2* 14.4* 10.5  --   HGB 6.5* 6.8* 7.6* 7.4* 7.2* 7.7* 8.5*  HCT 20.6* 21.8* 23.6* 24.3* 23.5* 26.6* 25.0*  PLT 216 186  --  161 129* 199  --   MCV 75.2* 75.4*  --  76.9* 80.2 83.6  --   MCH 23.7* 23.5*  --  23.4* 24.6* 24.2*  --   MCHC 31.6 31.2  --  30.5 30.6 28.9*  --   RDW 18.7* 18.3*  --  18.0* 17.9* 18.3*  --   LYMPHSABS 1.1 1.0  --  0.9  --  1.4  --   MONOABS 1.6* 1.7*  --  1.7*  --  1.4*  --   EOSABS 0.3 0.3  --  0.3  --  0.4  --   BASOSABS 0.1 0.1  --  0.1  --  0.1  --    ------------------------------------------------------------------------------------------------------------------  Results for orders placed or performed during the hospital encounter of 02/12/21 (from the past 48 hour(s))  CBC with Differential     Status: Abnormal   Collection Time: 02/12/21 11:03 PM  Result Value Ref Range   WBC 10.5 4.0 - 10.5 K/uL   RBC 3.18 (L) 4.22 - 5.81 MIL/uL   Hemoglobin 7.7 (L) 13.0 - 17.0 g/dL   HCT 26.6 (L) 39.0 - 52.0 %   MCV 83.6 80.0 - 100.0 fL   MCH 24.2 (L) 26.0 - 34.0 pg   MCHC 28.9 (L) 30.0 - 36.0 g/dL   RDW 18.3 (H) 11.5 - 15.5 %   Platelets 199 150 - 400 K/uL   nRBC 0.0 0.0 - 0.2 %   Neutrophils  Relative % 68 %   Neutro Abs 7.1 1.7 - 7.7 K/uL   Lymphocytes Relative 14 %   Lymphs Abs 1.4 0.7 - 4.0 K/uL   Monocytes Relative 13 %   Monocytes Absolute 1.4 (H) 0.1 - 1.0 K/uL   Eosinophils Relative 3 %   Eosinophils Absolute 0.4 0.0 - 0.5 K/uL   Basophils Relative 1 %   Basophils Absolute 0.1 0.0 - 0.1 K/uL   Immature Granulocytes 1 %   Abs Immature Granulocytes 0.14 (H) 0.00 - 0.07 K/uL    Comment: Performed at [facility], [address], [place], [place] [address]  Basic metabolic panel     Status: Abnormal   Collection Time: 02/12/21 11:03 PM  Result Value Ref Range   Sodium 144 135 - 145 mmol/L   Potassium 3.9 3.5 - 5.1 mmol/L   Chloride 112 (H) 98 - 111 mmol/L   CO2 19 (L) 22 - 32 mmol/L   Glucose, Bld 80 70 -  99 mg/dL    Comment: Glucose reference range applies only to samples taken after fasting for at least 8 hours.   BUN 47 (H) 6 - 20 mg/dL   Creatinine, Ser 11.59 (H) 0.61 - 1.24 mg/dL   Calcium 8.2 (L) 8.9 - 10.3 mg/dL   GFR, Estimated 5 (L) >60 mL/min    Comment: (NOTE) Calculated using the CKD-EPI Creatinine Equation (2021)    Anion gap 13 5 - 15    Comment: Performed at [facility], [address], [place], [place] [address]  Protime-INR     Status: Abnormal   Collection Time: 02/12/21 11:08 PM  Result Value Ref Range   Prothrombin Time 15.4 (H) 11.4 - 15.2 seconds   INR 1.2 0.8 - 1.2    Comment: (NOTE) INR goal varies based on device and disease states. Performed at [facility], [address] [place], [place] [address]   APTT     Status: Abnormal   Collection Time: 02/12/21 11:08 PM  Result Value Ref Range   aPTT 39 (H) 24 - 36 seconds    Comment:        IF BASELINE aPTT IS ELEVATED, SUGGEST PATIENT RISK ASSESSMENT BE USED TO DETERMINE APPROPRIATE ANTICOAGULANT THERAPY. Performed at [facility], [address], [place], [place] [address]   Ginger Carne 8, ED     Status: Abnormal   Collection Time: 02/12/21 11:26 PM  Result Value Ref Range   Sodium 146 (H) 135 - 145 mmol/L   Potassium 4.0 3.5 - 5.1 mmol/L   Chloride 115 (H) 98 - 111 mmol/L   BUN 42 (H) 6 - 20 mg/dL   Creatinine, Ser 12.10 (H) 0.61 - 1.24 mg/dL   Glucose, Bld 73 70 - 99 mg/dL    Comment: Glucose reference range applies only to samples taken after fasting for at least 8 hours.   Calcium, Ion 1.11 (L) 1.15 - 1.40 mmol/L   TCO2 21 (L) 22 - 32 mmol/L   Hemoglobin 8.5 (L) 13.0 - 17.0 g/dL   HCT 25.0 (L) 39.0 - 52.0 %  Ethanol     Status: None   Collection Time: 02/12/21 11:28 PM  Result Value Ref Range   Alcohol, Ethyl (B) <10 <10 mg/dL    Comment: (NOTE) Lowest detectable limit for serum alcohol is 10 mg/dL.  For medical purposes only. Performed at [facility], [address], [place], [place]  [address]   Resp Panel by RT-PCR (Flu A&B, Covid) Nasopharyngeal Swab  Status: None   Collection Time: 02/13/21 12:36 AM   Specimen: Nasopharyngeal Swab; Nasopharyngeal(NP) swabs in vial transport medium  Result Value Ref Range   SARS Coronavirus 2 by RT PCR NEGATIVE NEGATIVE    Comment: (NOTE) SARS-CoV-2 target nucleic acids are NOT DETECTED.  The SARS-CoV-2 RNA is generally detectable in upper respiratory specimens during the acute phase of infection. The lowest concentration of SARS-CoV-2 viral copies this assay can detect is 138 copies/mL. A negative result does not preclude SARS-Cov-2 infection and should not be used as the sole basis for treatment or other patient management decisions. A negative result may occur with  improper specimen collection/handling, submission of specimen other than nasopharyngeal swab, presence of viral mutation(s) within the areas targeted by this assay, and inadequate number of viral copies(<138 copies/mL). A negative result must be combined with clinical observations, patient history, and epidemiological information. The expected result is Negative.  Fact Sheet for Patients:  EntrepreneurPulse.com.au  Fact Sheet for Healthcare Providers:  IncredibleEmployment.be  This test is no t yet approved or cleared by the [place] FDA and  has been authorized for detection and/or diagnosis of SARS-CoV-2 by FDA under an Emergency Use Authorization (EUA). This EUA will remain  in effect (meaning this test can be used) for the duration of the COVID-19 declaration under Section 564(b)(1) of the Act, 21 U.S.C.section 360bbb-3(b)(1), unless the authorization is terminated  or revoked sooner.       Influenza A by PCR NEGATIVE NEGATIVE   Influenza B by PCR NEGATIVE NEGATIVE    Comment: (NOTE) The Xpert Xpress SARS-CoV-2/FLU/RSV plus assay is intended as an aid in the diagnosis of influenza from Nasopharyngeal swab  specimens and should not be used as a sole basis for treatment. Nasal washings and aspirates are unacceptable for Xpert Xpress SARS-CoV-2/FLU/RSV testing.  Fact Sheet for Patients: EntrepreneurPulse.com.au  Fact Sheet for Healthcare Providers: IncredibleEmployment.be  This test is not yet approved or cleared by the [place] FDA and has been authorized for detection and/or diagnosis of SARS-CoV-2 by FDA under an Emergency Use Authorization (EUA). This EUA will remain in effect (meaning this test can be used) for the duration of the COVID-19 declaration under Section 564(b)(1) of the Act, 21 U.S.C. section 360bbb-3(b)(1), unless the authorization is terminated or revoked.  Performed at [facility], [address], [place], [place] [address]     Chemistries  Recent Labs  Lab 02/06/21 [phone] 02/07/21 [phone] 02/08/21 0757 02/09/21 0221 02/12/21 2303 02/12/21 2326  NA 141 140 134* 134* 144 146*  K 4.1 4.8 4.1 3.9 3.9 4.0  CL 108 106 98 100 112* 115*  CO2 22 21* 25 22 19*  --   GLUCOSE 97 81 98 91 80 73  BUN 49* 65* 40* 48* 47* 42*  CREATININE 10.72* 12.24* 8.78* 10.40* 11.59* 12.10*  CALCIUM 8.1* 8.1* 8.2* 8.1* 8.2*  --   MG 1.8 1.9  --   --   --   --   AST 11* 10* 8* 9*  --   --   ALT 11 6 <5 <5  --   --   ALKPHOS 57 52 50 55  --   --   BILITOT 0.8 0.8 0.7 0.8  --   --    ------------------------------------------------------------------------------------------------------------------  ------------------------------------------------------------------------------------------------------------------ GFR: Estimated Creatinine Clearance: 8.5 mL/min (A) (by C-G formula based on SCr of 12.1 mg/dL (H)). Liver Function Tests: Recent Labs  Lab 02/06/21 0433 02/07/21 0332 02/08/21 0757 02/09/21 0221  AST 11* 10* 8*  9*  ALT 11 6 <5 <5  ALKPHOS 57 52 50 55  BILITOT 0.8 0.8 0.7 0.8  PROT 5.9* 6.0* 5.7* 5.4*  ALBUMIN 2.5* 2.5* 2.2* 2.1*    No results for input(s): LIPASE, AMYLASE in the last 168 hours. No results for input(s): AMMONIA in the last 168 hours. Coagulation Profile: Recent Labs  Lab 02/12/21 2308  INR 1.2   Cardiac Enzymes: No results for input(s): CKTOTAL, CKMB, CKMBINDEX, TROPONINI in the last 168 hours. BNP (last 3 results) No results for input(s): PROBNP in the last 8760 hours. HbA1C: No results for input(s): HGBA1C in the last 72 hours. CBG: Recent Labs  Lab 02/09/21 1759  GLUCAP 74   Lipid Profile: No results for input(s): CHOL, HDL, LDLCALC, TRIG, CHOLHDL, LDLDIRECT in the last 72 hours. Thyroid Function Tests: No results for input(s): TSH, T4TOTAL, FREET4, T3FREE, THYROIDAB in the last 72 hours. Anemia Panel: No results for input(s): VITAMINB12, FOLATE, FERRITIN, TIBC, IRON, RETICCTPCT in the last 72 hours.  --------------------------------------------------------------------------------------------------------------- Urine analysis:    Component Value Date/Time   COLORURINE YELLOW (A) 11/07/2017 1855   APPEARANCEUR CLOUDY (A) 11/07/2017 1855   LABSPEC 1.016 11/07/2017 1855   PHURINE 5.0 11/07/2017 1855   GLUCOSEU >=500 (A) 11/07/2017 1855   HGBUR MODERATE (A) 11/07/2017 1855   BILIRUBINUR NEGATIVE 11/07/2017 1855   KETONESUR NEGATIVE 11/07/2017 1855   PROTEINUR >=300 (A) 11/07/2017 1855   NITRITE NEGATIVE 11/07/2017 1855   LEUKOCYTESUR NEGATIVE 11/07/2017 1855      Imaging Results:    CT Head Wo Contrast  Result Date: 02/12/2021 CLINICAL DATA:  Mental status change.  Slurred speech. EXAM: CT HEAD WITHOUT CONTRAST TECHNIQUE: Contiguous axial images were obtained from the base of the skull through the vertex without intravenous contrast. COMPARISON:  CT head 01/08/2021. FINDINGS: Brain: No evidence of acute infarction, hemorrhage, hydrocephalus, extra-axial collection or mass lesion/mass effect. Vascular: No hyperdense vessel or unexpected calcification. Skull: Normal. Negative  for fracture or focal lesion. Sinuses/Orbits: No acute finding. Other: Peripheral vascular calcifications are again seen. Occipital skin thickening is unchanged. IMPRESSION: No acute intracranial abnormality. Electronically Signed   By: Ronney Asters M.D.   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 At risk with history of A. fib, echo in the a.m. Continue Eliquis PT ST OT consult Lipid panel and hemoglobin A1c in the a.m. Stroke order set utilized Hypertension Continue amlodipine Diabetes mellitus type 2 Sugars running soft here, last hemoglobin A1c was well controlled at 5.4 Sensitive sliding scale Continue to monitor A. Fib Continue amiodarone and Eliquis Coronary artery disease Continue Plavix, Coreg, statin ESRD on HD Consult nephro for routine dialysis Osteomyelitis Continue Bactrim    DVT Prophylaxis-   Eliquis  AM Labs Ordered, also please review Full Orders  Family Communication: Admission, patients condition and plan of care including tests being ordered have been discussed with the patient and wife who indicate understanding and agree with the plan and Code Status.  Code Status: Full  Admission status: Observation Disposition: Anticipated Discharge date 24 hours discharge to home  Time spent in minutes : 65  [place]

## 2021-02-13 NOTE — Discharge Summary (Signed)
Physician Discharge Summary  [name]. XFG:[id number] DOB: 02-19-1971 DOA: 02/12/2021  PCP: [name], MD  Admit date: 02/12/2021 Discharge date: 02/14/2021  Admitted From: Home Disposition:  Home  Recommendations for Outpatient Follow-up:  Follow up with PCP in 1-2 weeks Please obtain BMP/CBC in one week your next doctors. Continue Plavix and Eliquis. added PPI daily Follow up outpatient Neurology in 1 months.  Continue statin.     Discharge Condition: Stable CODE STATUS: Full  Diet recommendation: Diabetic  Brief/Interim Summary: 50 y.o. male, with history of ESRD on hemodialysis, CHF, diabetes mellitus type 2, GERD, gait instability requiring walker, hypertension, hyperlipidemia, A. fib, and more presents ED with a chief complaint of change in speech. CT head shows no acute intracranial abnormality.  Patient recently had amputation of his right upper extremity finger.  His anticoagulation was held for several days.  MRI showed acute infarct.  Patient was evaluated by neurology team who recommended continuing current medications.  EEG performe, which was negative.    Acute CVA, hippocampus left -CT of the head is negative.  Slurred speech has pretty much resolved.  MRI consistent with acute CVA.  LDL 38, A1c 5.7.Carotid ultrasound was overall unremarkable.  Patient was seen by neurology who recommended continuing Eliquis and Plavix as outpatient. PPI added as well. EEG prior to dc.   Follow-up outpatient neurology in about 4 weeks.  Essential HTN -Resume home meds.   Chronic A Fib -Cont Eliquis and Plavix.   ESRD on HD -has his outptn HD tomorrow.   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 Follow up with outptn Surgery team to see how long it needs to be continued for.   Body mass index is 25.43 kg/m.         Discharge Diagnoses:  Principal Problem:   CVA (cerebral vascular accident)  ([place]) Active Problems:   Essential hypertension, benign   Diabetes mellitus, type II ([place])   Osteomyelitis of finger of right hand ([facility])   AF (paroxysmal atrial fibrillation) ([place])   ESRD on hemodialysis ([place])   Coronary artery disease      Consultations: Neurology  Subjective: Slurred speech improved. Alsmost back t baseline.  Tells his his AC was help for several days in anticipation for Sx.   Discharge Exam: Vitals:   02/13/21 1046 02/13/21 1230  BP: (!) 167/74 (!) 165/81  Pulse:  (!) 57  Resp:  18  Temp:  97.7 F (36.5 C)  SpO2:  96%   Vitals:   02/13/21 1000 02/13/21 1030 02/13/21 1046 02/13/21 1230  BP: (!) 157/74 (!) 167/74 (!) 167/74 (!) 165/81  Pulse: (!) 56 (!) 56  (!) 57  Resp: 10 11  18   Temp:    97.7 F (36.5 C)  TempSrc:    Oral  SpO2: 99% 100%  96%  Weight:    92.3 kg  Height:    6\' 3"  (1.905 m)    General: Pt is alert, awake, not in acute distress Cardiovascular: RRR, S1/S2 +, no rubs, no gallops Respiratory: CTA bilaterally, no wheezing, no rhonchi Abdominal: Soft, NT, ND, bowel sounds + Extremities: no edema, no cyanosis  Discharge Instructions   Allergies as of 02/13/2021       Reactions   Levofloxacin Swelling   Of face Other reaction(s): swelling   Promethazine Diarrhea, Other (See Comments)   Other reaction(s): Other (See Comments), Other (See Comments) Other reaction(s): Muscle cramps Muscle cramps Cramping Other  reaction(s): Muscle cramps Muscle cramps   Other    Other reaction(s): Unknown   Malt Other (See Comments)   Other reaction(s): cramping        Medication List     TAKE these medications    acetaminophen 325 MG tablet Commonly known as: TYLENOL Take 2 tablets (650 mg total) by mouth every 6 (six) hours as needed for mild pain (or Fever >/= 101).   amiodarone 200 MG tablet Commonly known as: Pacerone Take 1 tablet (200 mg total) by mouth daily   amLODipine 5 MG tablet Commonly known as: NORVASC Take  1 tablet (5 mg total) by mouth daily.   artificial tears Oint ophthalmic ointment Commonly known as: LACRILUBE Place into the left eye 4 (four) times daily.   atorvastatin 80 MG tablet Commonly known as: LIPITOR TAKE 1 TABLET (80 MG TOTAL) BY MOUTH AT BEDTIME.   azelastine 0.1 % nasal spray Commonly known as: ASTELIN Place 2 sprays into both nostrils 2 (two) times daily.   calcium acetate 667 MG capsule Commonly known as: PHOSLO Take 3 capsules (2,001 mg total) by mouth 3 (three) times daily with meals.   carvedilol 25 MG tablet Commonly known as: COREG Take 1 tablet (25 mg total) by mouth 2 (two) times daily with a meal.   clopidogrel 75 MG tablet Commonly known as: PLAVIX Take 1 tablet (75 mg total) by mouth daily.   cyanocobalamin 1000 MCG tablet Take 1 tablet (1,000 mcg total) by mouth daily.   Eliquis 5 MG Tabs tablet Generic drug: apixaban Take 1 tablet (5 mg total) by mouth 2 (two) times daily.   FeroSul 325 (65 FE) MG tablet Generic drug: ferrous sulfate Take 325 mg by mouth daily.   fluticasone 50 MCG/ACT nasal spray Commonly known as: FLONASE Place 2 sprays into both nostrils daily.   folic acid 1 MG tablet Commonly known as: FOLVITE Take 1 tablet (1 mg total) by mouth daily.   HYDROcodone-acetaminophen 5-325 MG tablet Commonly known as: NORCO/VICODIN Take 2 tablets by mouth 2 (two) times daily.   nitroGLYCERIN 0.4 MG SL tablet Commonly known as: NITROSTAT Place 1 tablet (0.4 mg total) under the tongue every 5 (five) minutes as needed for chest pain.   pantoprazole 40 MG tablet Commonly known as: PROTONIX Take 1 tablet (40 mg total) by mouth daily before breakfast.   prednisoLONE acetate 1 % ophthalmic suspension Commonly known as: PRED FORTE Place 1 drop into the left eye every 4 (four) hours.   sulfamethoxazole-trimethoprim 800-160 MG tablet Commonly known as: BACTRIM DS Take 1 tablet by mouth every other day.   tamsulosin 0.4 MG Caps  capsule Commonly known as: FLOMAX Take 0.4 mg by mouth daily after breakfast.        Follow-up Information     [name], MD Follow up in 6 month(s).   Specialty: Family Medicine Contact information: [address] [place] [address] [phone]         Jettie Booze, MD .   Specialties: Cardiology, Radiology, Interventional Cardiology Contact information: [address] [place] [address] [phone]         [name], MD Follow up in 1 month(s).   Specialty: Family Medicine Contact information: [address] [place] [address] [phone]         Jettie Booze, MD .   Specialties: Cardiology, Radiology, Interventional Cardiology Contact information: [address] [place] [address] [phone]  Allergies  Allergen Reactions   Levofloxacin Swelling    Of face Other reaction(s): swelling   Promethazine Diarrhea and Other (See Comments)    Other reaction(s): Other (See Comments), Other (See Comments) Other reaction(s): Muscle cramps Muscle cramps Cramping Other reaction(s): Muscle cramps Muscle cramps   Other     Other reaction(s): Unknown   Malt Other (See Comments)    Other reaction(s): cramping    You were cared for by a hospitalist during your hospital stay. If you have any questions about your discharge medications or the care you received while you were in the hospital after you are discharged, you can call the unit and asked to speak with the hospitalist on call if the hospitalist that took care of you is not available. Once you are discharged, your primary care physician will handle any further medical issues. Please note that no refills for any discharge medications will be authorized once you are discharged, as it is imperative that you return to your primary care physician (or establish a relationship with a primary care physician if you do  not have one) for your aftercare needs so that they can reassess your need for medications and monitor your lab values.   Procedures/Studies: CT Head Wo Contrast  Result Date: 02/12/2021 CLINICAL DATA:  Mental status change.  Slurred speech. EXAM: CT HEAD WITHOUT CONTRAST TECHNIQUE: Contiguous axial images were obtained from the base of the skull through the vertex without intravenous contrast. COMPARISON:  CT head 01/08/2021. FINDINGS: Brain: No evidence of acute infarction, hemorrhage, hydrocephalus, extra-axial collection or mass lesion/mass effect. Vascular: No hyperdense vessel or unexpected calcification. Skull: Normal. Negative for fracture or focal lesion. Sinuses/Orbits: No acute finding. Other: Peripheral vascular calcifications are again seen. Occipital skin thickening is unchanged. IMPRESSION: No acute intracranial abnormality. Electronically Signed   By: Ronney Asters M.D.   On: 02/12/2021 23:26   MR ANGIO HEAD WO CONTRAST  Result Date: 02/13/2021 CLINICAL DATA:  Neuro deficit, acute, stroke suspected EXAM: MRI HEAD WITHOUT CONTRAST MRA HEAD WITHOUT CONTRAST TECHNIQUE: Multiplanar, multi-echo pulse sequences of the brain and surrounding structures were acquired without intravenous contrast. Angiographic images of the Circle of Willis were acquired using MRA technique without intravenous contrast. COMPARISON:  August 2018 MRI brain FINDINGS: MRI HEAD FINDINGS Brain: There is a punctate focus of reduced diffusion along the left hippocampus. No evidence of intracranial hemorrhage. There is no intracranial mass, mass effect, or edema. There is no hydrocephalus or extra-axial fluid collection. Prominence of ventricles and sulci reflects parenchymal volume loss. Patchy T2 hyperintensity in the supratentorial and pontine white matter is nonspecific but may reflect mild chronic microvascular ischemic changes. Vascular: Major vessel flow voids at the skull base are preserved. Skull and upper cervical  spine: Normal marrow signal is preserved. Sinuses/Orbits: Mild mucosal thickening. Left ocular prosthesis. Right lens replacement. Other: Sella is unremarkable.  Mastoid air cells are clear. MRA HEAD FINDINGS Anterior circulation: Intracranial internal carotid arteries are patent with atherosclerotic irregularity. Anterior and middle cerebral arteries are patent. Posterior circulation: Intracranial vertebral arteries are partially imaged but patent. Basilar artery is patent. Posterior cerebral arteries are patent. A right posterior communicating artery is present. IMPRESSION: Punctate acute infarct of the left hippocampus. Correlate for transient global amnesia. Mild chronic microvascular ischemic changes. No proximal intracranial vessel occlusion or significant stenosis. Electronically Signed   By: Macy Mis M.D.   On: 02/13/2021 09:09   MR BRAIN WO CONTRAST  Result Date: 02/13/2021 CLINICAL DATA:  Neuro deficit, acute,  stroke suspected EXAM: MRI HEAD WITHOUT CONTRAST MRA HEAD WITHOUT CONTRAST TECHNIQUE: Multiplanar, multi-echo pulse sequences of the brain and surrounding structures were acquired without intravenous contrast. Angiographic images of the Circle of Willis were acquired using MRA technique without intravenous contrast. COMPARISON:  August 2018 MRI brain FINDINGS: MRI HEAD FINDINGS Brain: There is a punctate focus of reduced diffusion along the left hippocampus. No evidence of intracranial hemorrhage. There is no intracranial mass, mass effect, or edema. There is no hydrocephalus or extra-axial fluid collection. Prominence of ventricles and sulci reflects parenchymal volume loss. Patchy T2 hyperintensity in the supratentorial and pontine white matter is nonspecific but may reflect mild chronic microvascular ischemic changes. Vascular: Major vessel flow voids at the skull base are preserved. Skull and upper cervical spine: Normal marrow signal is preserved. Sinuses/Orbits: Mild mucosal  thickening. Left ocular prosthesis. Right lens replacement. Other: Sella is unremarkable.  Mastoid air cells are clear. MRA HEAD FINDINGS Anterior circulation: Intracranial internal carotid arteries are patent with atherosclerotic irregularity. Anterior and middle cerebral arteries are patent. Posterior circulation: Intracranial vertebral arteries are partially imaged but patent. Basilar artery is patent. Posterior cerebral arteries are patent. A right posterior communicating artery is present. IMPRESSION: Punctate acute infarct of the left hippocampus. Correlate for transient global amnesia. Mild chronic microvascular ischemic changes. No proximal intracranial vessel occlusion or significant stenosis. Electronically Signed   By: Macy Mis M.D.   On: 02/13/2021 09:09   MR HAND RIGHT WO CONTRAST  Result Date: 02/08/2021 CLINICAL DATA:  Osteomyelitis, hand EXAM: MRI OF THE RIGHT HAND WITHOUT CONTRAST TECHNIQUE: Multiplanar, multisequence MR imaging of the right hand was performed. No intravenous contrast was administered. COMPARISON:  Radiograph 02/04/2021, 01/28/2021, 11/24/2020. FINDINGS: Bones/Joint/Cartilage Prior ring finger amputation with residual proximal phalanx. There is bony edema and mildly low T1 signal within the proximal phalanx. Ligaments Intact MCP collateral ligaments. No pulley injury of the non amputated digits. Muscles and Tendons Retracted ring finger flexor tendons due to amputation. No other acute tendon tear. No tenosynovitis. Extensive intramuscular edema throughout the hand, ulnar-sided worse than radial. Soft tissues Extensive soft tissue swelling of the hand. There is no well-defined fluid collection. IMPRESSION: Findings are consistent with osteomyelitis of the residual ring finger proximal phalanx. Extensive soft tissue swelling and intramuscular edema throughout the hand, without well-defined fluid collection. Electronically Signed   By: Maurine Simmering M.D.   On: 02/08/2021 12:51    US Carotid Bilateral (at [facility] and [facility] only)  Result Date: 02/13/2021 CLINICAL DATA:  Dysarthria, ataxia EXAM: BILATERAL CAROTID DUPLEX ULTRASOUND TECHNIQUE: Pearline Cables scale imaging, color Doppler and duplex ultrasound were performed of bilateral carotid and vertebral arteries in the neck. COMPARISON:  None. FINDINGS: Criteria: Quantification of carotid stenosis is based on velocity parameters that correlate the residual internal carotid diameter with NASCET-based stenosis levels, using the diameter of the distal internal carotid lumen as the denominator for stenosis measurement. The following velocity measurements were obtained: RIGHT ICA: 68/19 cm/sec CCA: 45/62 cm/sec SYSTOLIC ICA/CCA RATIO:  0.7 ECA: 79 cm/sec LEFT ICA: 100/19 cm/sec CCA: 56/38 cm/sec SYSTOLIC ICA/CCA RATIO:  1.0 ECA: 70 cm/sec RIGHT CAROTID ARTERY: Trace intimal thickening and minimal atherosclerotic change. Negative for stenosis, velocity elevation, turbulent flow. Degree of narrowing less than 50% by ultrasound criteria. RIGHT VERTEBRAL ARTERY:  Normal antegrade flow LEFT CAROTID ARTERY: Trace intimal thickening and minimal atherosclerotic change. Negative for stenosis, velocity elevation, or turbulent flow. Degree of narrowing also less than 50% by ultrasound criteria. LEFT VERTEBRAL ARTERY:  Normal antegrade flow  IMPRESSION: Minimal carotid atherosclerosis. Negative for stenosis. Degree of narrowing less than 50% bilaterally by ultrasound criteria. Patent antegrade vertebral flow bilaterally Electronically Signed   By: Jerilynn Mages.  Shick M.D.   On: 02/13/2021 09:52   PERIPHERAL VASCULAR CATHETERIZATION  Result Date: 02/06/2021 See surgical note for result.  PERIPHERAL VASCULAR CATHETERIZATION  Result Date: 02/05/2021 See surgical note for result.  DG Hand Complete Right  Result Date: 02/04/2021 CLINICAL DATA:  Right hand wound, fever.  Surgery in October of 2022 EXAM: RIGHT HAND - COMPLETE 3+ VIEW COMPARISON:  01/28/2021 FINDINGS:  Postsurgical changes from ring finger amputation at the level of the proximal phalanx. New destructive changes involving the base of the ring finger proximal phalanx compatible with acute osteomyelitis. No fracture or dislocation. Osseous structures are demineralized. Severe small vessel atherosclerotic calcifications. Prominent soft tissue swelling of the hand. IMPRESSION: New destructive changes involving the base of the ring finger proximal phalanx compatible with acute osteomyelitis. Electronically Signed   By: Davina Poke D.O.   On: 02/04/2021 16:49   DG Hand Complete Right  Result Date: 01/28/2021 CLINICAL DATA:  Pt comes into the ED via POV c/o infection in his right hand. 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 Sharp margins. No cortical erosion or destruction. There is no evidence of fracture or dislocation. There is no evidence of severe arthropathy or aggressive appearing focal bone abnormality. Soft tissues are unremarkable. Vascular calcifications. 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 Electronically Signed   By: Iven Finn M.D.   On: 01/28/2021 18:55   EEG adult  Result Date: 02/13/2021 Lora Havens, MD     02/13/2021  5:27 PM Patient Name: [name]. MRN: [id number] Epilepsy Attending: Lora Havens Referring Physician/Provider: Dr Zeb Comfort Date: 02/13/2021 Duration: 22.48 mins Patient history: 50 year old male with history of atrial fibrillation on Eliquis who presented with transient speech disturbance and MRI brain showed restricted diffusion in left hippocampus. EEG to evaluate for seizure Level of alertness: Awake, asleep AEDs during EEG study: None Technical aspects: This EEG study was done with scalp  electrodes positioned according to the 10-20 International system of electrode placement. Electrical activity was acquired at a sampling rate of 500Hz  and reviewed with a high frequency filter of 70Hz  and a low frequency filter of 1Hz . EEG data were recorded continuously and digitally stored. Description: The posterior dominant rhythm consists of 8 Hz activity of moderate voltage (25-35 uV) seen predominantly in posterior head regions, symmetric and reactive to eye opening and eye closing. Sleep was characterized by vertex waves, sleep spindles (12 to 14 Hz), maximal frontocentral region. Hyperventilation and photic stimulation were not performed.   IMPRESSION: This study is within normal limits. No seizures or epileptiform discharges were seen throughout the recording. Priyanka O Yadav   VAS US DUPLEX DIALYSIS ACCESS (AVF,AVG)  Result Date: 02/09/2021 DIALYSIS ACCESS Patient Name:  Rogen Porte.  Date of Exam:   02/04/2021 Medical Rec #: [id number]             Accession #:    [id number] Date of Birth: 01/15/1971              Patient Gender: M Patient Age:   4 years Exam Location:  [facility] Procedure:  VAS US DUPLEX DIALYSIS ACCESS (AVF, AVG) Referring Phys: [name] --------------------------------------------------------------------------------  Reason for Exam: S/P banding 11/27/2020. Access Site: Right Upper Extremity. Access Type: Brachial-cephalic AVF. History: Created 05/31/2019. Comparison Study: 08/19/2020 Performing Technologist: Concha Norway RVT  Examination Guidelines: A complete evaluation includes B-mode imaging, spectral Doppler, color Doppler, and power Doppler as needed of all accessible portions of each vessel. Unilateral testing is considered an integral part of a complete examination. Limited examinations for reoccurring indications may be performed as noted.  Findings: +--------------------+----------+-----------------+--------+  AVF                  PSV (cm/s) Flow  Vol (mL/min) Comments  +--------------------+----------+-----------------+--------+  Native artery inflow    316           2618                  +--------------------+----------+-----------------+--------+  AVF Anastomosis         307                                 +--------------------+----------+-----------------+--------+  +---------------+----------+-------------+----------+--------+  OUTFLOW VEIN    PSV (cm/s) Diameter (cm) Depth (cm) Describe  +---------------+----------+-------------+----------+--------+  Subclavian vein     83                                        +---------------+----------+-------------+----------+--------+  [place]         213                                        +---------------+----------+-------------+----------+--------+  Prox UA            147                                        +---------------+----------+-------------+----------+--------+  Mid UA              75                                        +---------------+----------+-------------+----------+--------+  Dist UA            284                                        +---------------+----------+-------------+----------+--------+  +-------------+-------------+----------+---------+---------+-------------------+                Diameter (cm) Depth (cm) Branching    PSV        Flow Volume                                                         (cm/s)        (ml/min)        +-------------+-------------+----------+---------+---------+-------------------+  Rt Rad Art  103                          Dis                                                                             +-------------+-------------+----------+---------+---------+-------------------+  Rt Uln Art                                          78                           Dis                                                                              +-------------+-------------+----------+---------+---------+-------------------+  Summary: Patent arteriovenous fistula. *See table(s) above for measurements and observations.  Diagnosing physician: [name] MD Electronically signed by [name] MD on 02/09/2021 at 12:42:53 PM.   --------------------------------------------------------------------------------   Final      The results of significant diagnostics from this hospitalization (including imaging, microbiology, ancillary and laboratory) are listed below for reference.     Microbiology: Recent Results (from the past 240 hour(s))  Culture, blood (routine x 2)     Status: None   Collection Time: 02/04/21  4:28 PM   Specimen: BLOOD  Result Value Ref Range Status   Specimen Description BLOOD BLOOD LEFT HAND  Final   Special Requests   Final    BOTTLES DRAWN AEROBIC AND ANAEROBIC Blood Culture results may not be optimal due to an excessive volume of blood received in culture bottles   Culture   Final    NO GROWTH 5 DAYS Performed at [facility], [address], [place], [place] [address]    Report Status 02/09/2021 FINAL  Final  Culture, blood (routine x 2)     Status: None   Collection Time: 02/04/21  4:30 PM   Specimen: BLOOD  Result Value Ref Range Status   Specimen Description BLOOD BLOOD LEFT FOREARM  Final   Special Requests   Final    BOTTLES DRAWN AEROBIC AND ANAEROBIC Blood Culture adequate volume   Culture   Final    NO GROWTH 5 DAYS Performed at [facility], [address] [place], [place] [address]    Report Status 02/09/2021 FINAL  Final  Resp Panel by RT-PCR (Flu A&B, Covid) Nasopharyngeal Swab     Status: None   Collection Time: 02/04/21  4:30 PM   Specimen: Nasopharyngeal Swab; Nasopharyngeal(NP) swabs in vial transport medium  Result Value Ref Range Status   SARS Coronavirus 2 by RT PCR NEGATIVE NEGATIVE Final    Comment: (NOTE) SARS-CoV-2 target nucleic acids are NOT DETECTED.  The SARS-CoV-2 RNA is  generally detectable in upper respiratory specimens  during the acute phase of infection. The lowest concentration of SARS-CoV-2 viral copies this assay can detect is 138 copies/mL. A negative result does not preclude SARS-Cov-2 infection and should not be used as the sole basis for treatment or other patient management decisions. A negative result may occur with  improper specimen collection/handling, submission of specimen other than nasopharyngeal swab, presence of viral mutation(s) within the areas targeted by this assay, and inadequate number of viral copies(<138 copies/mL). A negative result must be combined with clinical observations, patient history, and epidemiological information. The expected result is Negative.  Fact Sheet for Patients:  EntrepreneurPulse.com.au  Fact Sheet for Healthcare Providers:  IncredibleEmployment.be  This test is no t yet approved or cleared by the [place] FDA and  has been authorized for detection and/or diagnosis of SARS-CoV-2 by FDA under an Emergency Use Authorization (EUA). This EUA will remain  in effect (meaning this test can be used) for the duration of the COVID-19 declaration under Section 564(b)(1) of the Act, 21 U.S.C.section 360bbb-3(b)(1), unless the authorization is terminated  or revoked sooner.       Influenza A by PCR NEGATIVE NEGATIVE Final   Influenza B by PCR NEGATIVE NEGATIVE Final    Comment: (NOTE) The Xpert Xpress SARS-CoV-2/FLU/RSV plus assay is intended as an aid in the diagnosis of influenza from Nasopharyngeal swab specimens and should not be used as a sole basis for treatment. Nasal washings and aspirates are unacceptable for Xpert Xpress SARS-CoV-2/FLU/RSV testing.  Fact Sheet for Patients: EntrepreneurPulse.com.au  Fact Sheet for Healthcare Providers: IncredibleEmployment.be  This test is not yet approved or cleared by the [place] FDA and has been authorized for detection and/or diagnosis of SARS-CoV-2 by FDA under an Emergency Use Authorization (EUA). This EUA will remain in effect (meaning this test can be used) for the duration of the COVID-19 declaration under Section 564(b)(1) of the Act, 21 U.S.C. section 360bbb-3(b)(1), unless the authorization is terminated or revoked.  Performed at [facility], [address], [place], [place] [address]   CULTURE, BLOOD (ROUTINE X 2) w Reflex to ID Panel     Status: None   Collection Time: 02/04/21 11:40 PM   Specimen: BLOOD  Result Value Ref Range Status   Specimen Description BLOOD LEFT ASSIST CONTROL  Final   Special Requests   Final    BOTTLES DRAWN AEROBIC AND ANAEROBIC Blood Culture adequate volume   Culture   Final    NO GROWTH 5 DAYS Performed at [facility], [address] [place], [place] [address]    Report Status 02/10/2021 FINAL  Final  CULTURE, BLOOD (ROUTINE X 2) w Reflex to ID Panel     Status: None   Collection Time: 02/04/21 11:40 PM   Specimen: BLOOD  Result Value Ref Range Status   Specimen Description BLOOD LEFT HAND  Final   Special Requests   Final    BOTTLES DRAWN AEROBIC AND ANAEROBIC Blood Culture adequate volume   Culture   Final    NO GROWTH 5 DAYS Performed at [facility], [address], [place], [place] [address]    Report Status 02/10/2021 FINAL  Final  MRSA Next Gen by PCR, Nasal     Status: None   Collection Time: 02/05/21  1:24 AM   Specimen: Nasal Mucosa; Nasal Swab  Result Value Ref Range Status   MRSA by PCR Next Gen NOT DETECTED NOT DETECTED Final    Comment: (NOTE) The GeneXpert MRSA Assay (FDA approved for  NASAL specimens only), is one component of a comprehensive MRSA colonization surveillance program. It is not intended to diagnose MRSA infection nor to guide or monitor treatment for MRSA infections. Test performance is not FDA approved in patients less than 85 years old. Performed at  [facility], [address] [place], [place] [address]   Surgical pcr screen     Status: None   Collection Time: 02/08/21 10:14 PM   Specimen: Nasal Mucosa; Nasal Swab  Result Value Ref Range Status   MRSA, PCR NEGATIVE NEGATIVE Final   Staphylococcus aureus NEGATIVE NEGATIVE Final    Comment: (NOTE) The Xpert SA Assay (FDA approved for NASAL specimens in patients 37 years of age and older), is one component of a comprehensive surveillance program. It is not intended to diagnose infection nor to guide or monitor treatment. Performed at [facility], [place] [address] [place], [place] [address]   Aerobic/Anaerobic Culture w Gram Stain (surgical/deep wound)     Status: None (Preliminary result)   Collection Time: 02/09/21  5:11 PM   Specimen: Wound; Tissue  Result Value Ref Range Status   Specimen Description WOUND RIGHT FINGER  Final   Special Requests SPEC A  Final   Gram Stain   Final    NO SQUAMOUS EPITHELIAL CELLS SEEN FEW WBC SEEN NO ORGANISMS SEEN    Culture   Final    FEW CITROBACTER YOUNGAE RARE PROTEUS MIRABILIS SUSCEPTIBILITIES PERFORMED ON PREVIOUS CULTURE WITHIN THE LAST 5 DAYS. RARE BACTEROIDES VULGATUS BETA LACTAMASE POSITIVE CULTURE REINCUBATED FOR BETTER GROWTH Performed at [facility], [place] [address], [place], [place] [address]    Report Status PENDING  Incomplete  Aerobic/Anaerobic Culture w Gram Stain (surgical/deep wound)     Status: None (Preliminary result)   Collection Time: 02/09/21  5:15 PM   Specimen: Wound; Tissue  Result Value Ref Range Status   Specimen Description TISSUE RIGHT FINGER  Final   Special Requests SPEC B  Final   Gram Stain   Final    NO SQUAMOUS EPITHELIAL CELLS SEEN FEW WBC SEEN NO ORGANISMS SEEN    Culture   Final    FEW CITROBACTER YOUNGAE RARE PROTEUS MIRABILIS RARE BACTEROIDES THETAIOTAOMICRON BETA LACTAMASE POSITIVE CULTURE REINCUBATED FOR BETTER GROWTH Performed at [facility],  [place] [address] [place], [place] [address]    Report Status PENDING  Incomplete   Organism ID, Bacteria CITROBACTER YOUNGAE  Final   Organism ID, Bacteria PROTEUS MIRABILIS  Final      Susceptibility   Citrobacter youngae - MIC*    CEFAZOLIN >=64 RESISTANT Resistant     CEFEPIME <=0.12 SENSITIVE Sensitive     CEFTAZIDIME <=1 SENSITIVE Sensitive     CEFTRIAXONE <=0.25 SENSITIVE Sensitive     CIPROFLOXACIN <=0.25 SENSITIVE Sensitive     GENTAMICIN <=1 SENSITIVE Sensitive     IMIPENEM <=0.25 SENSITIVE Sensitive     TRIMETH/SULFA <=20 SENSITIVE Sensitive     PIP/TAZO <=4 SENSITIVE Sensitive     * FEW CITROBACTER YOUNGAE   Proteus mirabilis - MIC*    AMPICILLIN <=2 SENSITIVE Sensitive     CEFAZOLIN <=4 SENSITIVE Sensitive     CEFEPIME <=0.12 SENSITIVE Sensitive     CEFTAZIDIME <=1 SENSITIVE Sensitive     CEFTRIAXONE <=0.25 SENSITIVE Sensitive     CIPROFLOXACIN <=0.25 SENSITIVE Sensitive     GENTAMICIN <=1 SENSITIVE Sensitive     IMIPENEM 2 SENSITIVE Sensitive     TRIMETH/SULFA <=20 SENSITIVE Sensitive  AMPICILLIN/SULBACTAM <=2 SENSITIVE Sensitive     PIP/TAZO <=4 SENSITIVE Sensitive     * RARE PROTEUS MIRABILIS  Resp Panel by RT-PCR (Flu A&B, Covid) Nasopharyngeal Swab     Status: None   Collection Time: 02/13/21 12:36 AM   Specimen: Nasopharyngeal Swab; Nasopharyngeal(NP) swabs in vial transport medium  Result Value Ref Range Status   SARS Coronavirus 2 by RT PCR NEGATIVE NEGATIVE Final    Comment: (NOTE) SARS-CoV-2 target nucleic acids are NOT DETECTED.  The SARS-CoV-2 RNA is generally detectable in upper respiratory specimens during the acute phase of infection. The lowest concentration of SARS-CoV-2 viral copies this assay can detect is 138 copies/mL. A negative result does not preclude SARS-Cov-2 infection and should not be used as the sole basis for treatment or other patient management decisions. A negative result may occur with  improper specimen collection/handling,  submission of specimen other than nasopharyngeal swab, presence of viral mutation(s) within the areas targeted by this assay, and inadequate number of viral copies(<138 copies/mL). A negative result must be combined with clinical observations, patient history, and epidemiological information. The expected result is Negative.  Fact Sheet for Patients:  EntrepreneurPulse.com.au  Fact Sheet for Healthcare Providers:  IncredibleEmployment.be  This test is no t yet approved or cleared by the [place] FDA and  has been authorized for detection and/or diagnosis of SARS-CoV-2 by FDA under an Emergency Use Authorization (EUA). This EUA will remain  in effect (meaning this test can be used) for the duration of the COVID-19 declaration under Section 564(b)(1) of the Act, 21 U.S.C.section 360bbb-3(b)(1), unless the authorization is terminated  or revoked sooner.       Influenza A by PCR NEGATIVE NEGATIVE Final   Influenza B by PCR NEGATIVE NEGATIVE Final    Comment: (NOTE) The Xpert Xpress SARS-CoV-2/FLU/RSV plus assay is intended as an aid in the diagnosis of influenza from Nasopharyngeal swab specimens and should not be used as a sole basis for treatment. Nasal washings and aspirates are unacceptable for Xpert Xpress SARS-CoV-2/FLU/RSV testing.  Fact Sheet for Patients: EntrepreneurPulse.com.au  Fact Sheet for Healthcare Providers: IncredibleEmployment.be  This test is not yet approved or cleared by the [place] FDA and has been authorized for detection and/or diagnosis of SARS-CoV-2 by FDA under an Emergency Use Authorization (EUA). This EUA will remain in effect (meaning this test can be used) for the duration of the COVID-19 declaration under Section 564(b)(1) of the Act, 21 U.S.C. section 360bbb-3(b)(1), unless the authorization is terminated or revoked.  Performed at [facility], [address], [place], [place] [address]   Blood culture (routine x 2)     Status: None (Preliminary result)   Collection Time: 02/13/21  1:23 AM   Specimen: Left Antecubital; Blood  Result Value Ref Range Status   Specimen Description LEFT ANTECUBITAL  Final   Special Requests   Final    BOTTLES DRAWN AEROBIC AND ANAEROBIC Blood Culture adequate volume   Culture   Final    NO GROWTH 1 DAY Performed at [facility], [address], [place], [place] [address]    Report Status PENDING  Incomplete  Blood culture (routine x 2)     Status: None (Preliminary result)   Collection Time: 02/13/21  1:23 AM   Specimen: BLOOD LEFT HAND  Result Value Ref Range Status   Specimen Description BLOOD LEFT HAND  Final   Special Requests   Final    BOTTLES DRAWN AEROBIC AND ANAEROBIC Blood Culture adequate volume  Culture   Final    NO GROWTH 1 DAY Performed at [facility], [address], [place], [place] [address]    Report Status PENDING  Incomplete     Labs: BNP (last 3 results) Recent Labs    12/08/20 1004  BNP 353.6*   Basic Metabolic Panel: Recent Labs  Lab 02/08/21 0757 02/09/21 0221 02/12/21 2303 02/12/21 2326 02/13/21 0124  NA 134* 134* 144 146* 144  K 4.1 3.9 3.9 4.0 3.9  CL 98 100 112* 115* 112*  CO2 25 22 19*  --  19*  GLUCOSE 98 91 80 73 81  BUN 40* 48* 47* 42* 48*  CREATININE 8.78* 10.40* 11.59* 12.10* 11.42*  CALCIUM 8.2* 8.1* 8.2*  --  8.3*   Liver Function Tests: Recent Labs  Lab 02/08/21 0757 02/09/21 0221 02/13/21 0124  AST 8* 9* 11*  ALT <5 <5 6  ALKPHOS 50 55 62  BILITOT 0.7 0.8 0.5  PROT 5.7* 5.4* 6.2*  ALBUMIN 2.2* 2.1* 2.7*   No results for input(s): LIPASE, AMYLASE in the last 168 hours. No results for input(s): AMMONIA in the last 168 hours. CBC: Recent Labs  Lab 02/08/21 0757 02/09/21 0221 02/12/21 2303 02/12/21 2326 02/13/21 0124  WBC 15.2* 14.4* 10.5  --  10.4  NEUTROABS 12.0*  --  7.1  --   --   HGB 7.4* 7.2* 7.7* 8.5* 7.5*  HCT 24.3* 23.5*  26.6* 25.0* 25.9*  MCV 76.9* 80.2 83.6  --  84.1  PLT 161 129* 199  --  200   Cardiac Enzymes: No results for input(s): CKTOTAL, CKMB, CKMBINDEX, TROPONINI in the last 168 hours. BNP: Invalid input(s): POCBNP CBG: Recent Labs  Lab 02/09/21 1759 02/13/21 0933  GLUCAP 74 93   D-Dimer No results for input(s): DDIMER in the last 72 hours. Hgb A1c Recent Labs    02/13/21 0124  HGBA1C 5.7*   Lipid Profile Recent Labs    02/13/21 0124  CHOL 84  HDL 32*  LDLCALC 38  TRIG 68  CHOLHDL 2.6   Thyroid function studies No results for input(s): TSH, T4TOTAL, T3FREE, THYROIDAB in the last 72 hours.  Invalid input(s): FREET3 Anemia work up No results for input(s): VITAMINB12, FOLATE, FERRITIN, TIBC, IRON, RETICCTPCT in the last 72 hours. Urinalysis    Component Value Date/Time   COLORURINE YELLOW (A) 11/07/2017 1855   APPEARANCEUR CLOUDY (A) 11/07/2017 1855   LABSPEC 1.016 11/07/2017 1855   PHURINE 5.0 11/07/2017 1855   GLUCOSEU >=500 (A) 11/07/2017 1855   HGBUR MODERATE (A) 11/07/2017 1855   BILIRUBINUR NEGATIVE 11/07/2017 1855   KETONESUR NEGATIVE 11/07/2017 1855   PROTEINUR >=300 (A) 11/07/2017 1855   NITRITE NEGATIVE 11/07/2017 1855   LEUKOCYTESUR NEGATIVE 11/07/2017 1855   Sepsis Labs Invalid input(s): PROCALCITONIN,  WBC,  LACTICIDVEN Microbiology Recent Results (from the past 240 hour(s))  Culture, blood (routine x 2)     Status: None   Collection Time: 02/04/21  4:28 PM   Specimen: BLOOD  Result Value Ref Range Status   Specimen Description BLOOD BLOOD LEFT HAND  Final   Special Requests   Final    BOTTLES DRAWN AEROBIC AND ANAEROBIC Blood Culture results may not be optimal due to an excessive volume of blood received in culture bottles   Culture   Final    NO GROWTH 5 DAYS Performed at [facility], [address], [place], [place] [address]    Report Status 02/09/2021 FINAL  Final  Culture, blood (routine x 2)  Status: None   Collection Time: 02/04/21   4:30 PM   Specimen: BLOOD  Result Value Ref Range Status   Specimen Description BLOOD BLOOD LEFT FOREARM  Final   Special Requests   Final    BOTTLES DRAWN AEROBIC AND ANAEROBIC Blood Culture adequate volume   Culture   Final    NO GROWTH 5 DAYS Performed at [facility], [address] [place], [place] [address]    Report Status 02/09/2021 FINAL  Final  Resp Panel by RT-PCR (Flu A&B, Covid) Nasopharyngeal Swab     Status: None   Collection Time: 02/04/21  4:30 PM   Specimen: Nasopharyngeal Swab; Nasopharyngeal(NP) swabs in vial transport medium  Result Value Ref Range Status   SARS Coronavirus 2 by RT PCR NEGATIVE NEGATIVE Final    Comment: (NOTE) SARS-CoV-2 target nucleic acids are NOT DETECTED.  The SARS-CoV-2 RNA is generally detectable in upper respiratory specimens during the acute phase of infection. The lowest concentration of SARS-CoV-2 viral copies this assay can detect is 138 copies/mL. A negative result does not preclude SARS-Cov-2 infection and should not be used as the sole basis for treatment or other patient management decisions. A negative result may occur with  improper specimen collection/handling, submission of specimen other than nasopharyngeal swab, presence of viral mutation(s) within the areas targeted by this assay, and inadequate number of viral copies(<138 copies/mL). A negative result must be combined with clinical observations, patient history, and epidemiological information. The expected result is Negative.  Fact Sheet for Patients:  EntrepreneurPulse.com.au  Fact Sheet for Healthcare Providers:  IncredibleEmployment.be  This test is no t yet approved or cleared by the [place] FDA and  has been authorized for detection and/or diagnosis of SARS-CoV-2 by FDA under an Emergency Use Authorization (EUA). This EUA will remain  in effect (meaning this test can be used) for the duration of the COVID-19  declaration under Section 564(b)(1) of the Act, 21 U.S.C.section 360bbb-3(b)(1), unless the authorization is terminated  or revoked sooner.       Influenza A by PCR NEGATIVE NEGATIVE Final   Influenza B by PCR NEGATIVE NEGATIVE Final    Comment: (NOTE) The Xpert Xpress SARS-CoV-2/FLU/RSV plus assay is intended as an aid in the diagnosis of influenza from Nasopharyngeal swab specimens and should not be used as a sole basis for treatment. Nasal washings and aspirates are unacceptable for Xpert Xpress SARS-CoV-2/FLU/RSV testing.  Fact Sheet for Patients: EntrepreneurPulse.com.au  Fact Sheet for Healthcare Providers: IncredibleEmployment.be  This test is not yet approved or cleared by the [place] FDA and has been authorized for detection and/or diagnosis of SARS-CoV-2 by FDA under an Emergency Use Authorization (EUA). This EUA will remain in effect (meaning this test can be used) for the duration of the COVID-19 declaration under Section 564(b)(1) of the Act, 21 U.S.C. section 360bbb-3(b)(1), unless the authorization is terminated or revoked.  Performed at [facility], [address], [place], [place] [address]   CULTURE, BLOOD (ROUTINE X 2) w Reflex to ID Panel     Status: None   Collection Time: 02/04/21 11:40 PM   Specimen: BLOOD  Result Value Ref Range Status   Specimen Description BLOOD LEFT ASSIST CONTROL  Final   Special Requests   Final    BOTTLES DRAWN AEROBIC AND ANAEROBIC Blood Culture adequate volume   Culture   Final    NO GROWTH 5 DAYS Performed at [facility], [address] [place], [place] [address]    Report Status  02/10/2021 FINAL  Final  CULTURE, BLOOD (ROUTINE X 2) w Reflex to ID Panel     Status: None   Collection Time: 02/04/21 11:40 PM   Specimen: BLOOD  Result Value Ref Range Status   Specimen Description BLOOD LEFT HAND  Final   Special Requests   Final    BOTTLES DRAWN AEROBIC AND ANAEROBIC  Blood Culture adequate volume   Culture   Final    NO GROWTH 5 DAYS Performed at [facility], [address], [place], [place] [address]    Report Status 02/10/2021 FINAL  Final  MRSA Next Gen by PCR, Nasal     Status: None   Collection Time: 02/05/21  1:24 AM   Specimen: Nasal Mucosa; Nasal Swab  Result Value Ref Range Status   MRSA by PCR Next Gen NOT DETECTED NOT DETECTED Final    Comment: (NOTE) The GeneXpert MRSA Assay (FDA approved for NASAL specimens only), is one component of a comprehensive MRSA colonization surveillance program. It is not intended to diagnose MRSA infection nor to guide or monitor treatment for MRSA infections. Test performance is not FDA approved in patients less than 30 years old. Performed at [facility], [address], [place], [place] [address]   Surgical pcr screen     Status: None   Collection Time: 02/08/21 10:14 PM   Specimen: Nasal Mucosa; Nasal Swab  Result Value Ref Range Status   MRSA, PCR NEGATIVE NEGATIVE Final   Staphylococcus aureus NEGATIVE NEGATIVE Final    Comment: (NOTE) The Xpert SA Assay (FDA approved for NASAL specimens in patients 57 years of age and older), is one component of a comprehensive surveillance program. It is not intended to diagnose infection nor to guide or monitor treatment. Performed at [facility], [place] [address] [place], [place] [address]   Aerobic/Anaerobic Culture w Gram Stain (surgical/deep wound)     Status: None (Preliminary result)   Collection Time: 02/09/21  5:11 PM   Specimen: Wound; Tissue  Result Value Ref Range Status   Specimen Description WOUND RIGHT FINGER  Final   Special Requests SPEC A  Final   Gram Stain   Final    NO SQUAMOUS EPITHELIAL CELLS SEEN FEW WBC SEEN NO ORGANISMS SEEN    Culture   Final    FEW CITROBACTER YOUNGAE RARE PROTEUS MIRABILIS SUSCEPTIBILITIES PERFORMED ON PREVIOUS CULTURE WITHIN THE LAST 5 DAYS. RARE BACTEROIDES VULGATUS BETA  LACTAMASE POSITIVE CULTURE REINCUBATED FOR BETTER GROWTH Performed at [facility], [place] [address] [place] [address], [place], [place] [address]    Report Status PENDING  Incomplete  Aerobic/Anaerobic Culture w Gram Stain (surgical/deep wound)     Status: None (Preliminary result)   Collection Time: 02/09/21  5:15 PM   Specimen: Wound; Tissue  Result Value Ref Range Status   Specimen Description TISSUE RIGHT FINGER  Final   Special Requests SPEC B  Final   Gram Stain   Final    NO SQUAMOUS EPITHELIAL CELLS SEEN FEW WBC SEEN NO ORGANISMS SEEN    Culture   Final    FEW CITROBACTER YOUNGAE RARE PROTEUS MIRABILIS RARE BACTEROIDES THETAIOTAOMICRON BETA LACTAMASE POSITIVE CULTURE REINCUBATED FOR BETTER GROWTH Performed at [facility], [place] [address] [place], [place] [address]    Report Status PENDING  Incomplete   Organism ID, Bacteria CITROBACTER YOUNGAE  Final   Organism ID, Bacteria PROTEUS MIRABILIS  Final      Susceptibility   Citrobacter youngae - MIC*  CEFAZOLIN >=64 RESISTANT Resistant     CEFEPIME <=0.12 SENSITIVE Sensitive     CEFTAZIDIME <=1 SENSITIVE Sensitive     CEFTRIAXONE <=0.25 SENSITIVE Sensitive     CIPROFLOXACIN <=0.25 SENSITIVE Sensitive     GENTAMICIN <=1 SENSITIVE Sensitive     IMIPENEM <=0.25 SENSITIVE Sensitive     TRIMETH/SULFA <=20 SENSITIVE Sensitive     PIP/TAZO <=4 SENSITIVE Sensitive     * FEW CITROBACTER YOUNGAE   Proteus mirabilis - MIC*    AMPICILLIN <=2 SENSITIVE Sensitive     CEFAZOLIN <=4 SENSITIVE Sensitive     CEFEPIME <=0.12 SENSITIVE Sensitive     CEFTAZIDIME <=1 SENSITIVE Sensitive     CEFTRIAXONE <=0.25 SENSITIVE Sensitive     CIPROFLOXACIN <=0.25 SENSITIVE Sensitive     GENTAMICIN <=1 SENSITIVE Sensitive     IMIPENEM 2 SENSITIVE Sensitive     TRIMETH/SULFA <=20 SENSITIVE Sensitive     AMPICILLIN/SULBACTAM <=2 SENSITIVE Sensitive     PIP/TAZO <=4 SENSITIVE Sensitive     * RARE PROTEUS MIRABILIS  Resp Panel by RT-PCR (Flu A&B,  Covid) Nasopharyngeal Swab     Status: None   Collection Time: 02/13/21 12:36 AM   Specimen: Nasopharyngeal Swab; Nasopharyngeal(NP) swabs in vial transport medium  Result Value Ref Range Status   SARS Coronavirus 2 by RT PCR NEGATIVE NEGATIVE Final    Comment: (NOTE) SARS-CoV-2 target nucleic acids are NOT DETECTED.  The SARS-CoV-2 RNA is generally detectable in upper respiratory specimens during the acute phase of infection. The lowest concentration of SARS-CoV-2 viral copies this assay can detect is 138 copies/mL. A negative result does not preclude SARS-Cov-2 infection and should not be used as the sole basis for treatment or other patient management decisions. A negative result may occur with  improper specimen collection/handling, submission of specimen other than nasopharyngeal swab, presence of viral mutation(s) within the areas targeted by this assay, and inadequate number of viral copies(<138 copies/mL). A negative result must be combined with clinical observations, patient history, and epidemiological information. The expected result is Negative.  Fact Sheet for Patients:  EntrepreneurPulse.com.au  Fact Sheet for Healthcare Providers:  IncredibleEmployment.be  This test is no t yet approved or cleared by the [place] FDA and  has been authorized for detection and/or diagnosis of SARS-CoV-2 by FDA under an Emergency Use Authorization (EUA). This EUA will remain  in effect (meaning this test can be used) for the duration of the COVID-19 declaration under Section 564(b)(1) of the Act, 21 U.S.C.section 360bbb-3(b)(1), unless the authorization is terminated  or revoked sooner.       Influenza A by PCR NEGATIVE NEGATIVE Final   Influenza B by PCR NEGATIVE NEGATIVE Final    Comment: (NOTE) The Xpert Xpress SARS-CoV-2/FLU/RSV plus assay is intended as an aid in the diagnosis of influenza from Nasopharyngeal swab specimens and should  not be used as a sole basis for treatment. Nasal washings and aspirates are unacceptable for Xpert Xpress SARS-CoV-2/FLU/RSV testing.  Fact Sheet for Patients: EntrepreneurPulse.com.au  Fact Sheet for Healthcare Providers: IncredibleEmployment.be  This test is not yet approved or cleared by the [place] FDA and has been authorized for detection and/or diagnosis of SARS-CoV-2 by FDA under an Emergency Use Authorization (EUA). This EUA will remain in effect (meaning this test can be used) for the duration of the COVID-19 declaration under Section 564(b)(1) of the Act, 21 U.S.C. section 360bbb-3(b)(1), unless the authorization is terminated or revoked.  Performed at [facility], [address] [place], [place] [address]  Blood culture (routine x 2)     Status: None (Preliminary result)   Collection Time: 02/13/21  1:23 AM   Specimen: Left Antecubital; Blood  Result Value Ref Range Status   Specimen Description LEFT ANTECUBITAL  Final   Special Requests   Final    BOTTLES DRAWN AEROBIC AND ANAEROBIC Blood Culture adequate volume   Culture   Final    NO GROWTH 1 DAY Performed at [facility], [address], [place], [place] [address]    Report Status PENDING  Incomplete  Blood culture (routine x 2)     Status: None (Preliminary result)   Collection Time: 02/13/21  1:23 AM   Specimen: BLOOD LEFT HAND  Result Value Ref Range Status   Specimen Description BLOOD LEFT HAND  Final   Special Requests   Final    BOTTLES DRAWN AEROBIC AND ANAEROBIC Blood Culture adequate volume   Culture   Final    NO GROWTH 1 DAY Performed at [facility], [address], [place],  [address]    Report Status PENDING  Incomplete     Time coordinating discharge:  I have spent 35 minutes face to face with the patient and on the ward discussing the patients care, assessment, plan and disposition with other care givers. >50% of the time was devoted  counseling the patient about the risks and benefits of treatment/Discharge disposition and coordinating care.   SIGNED:   Damita Lack, MD  [facility] 02/14/2021, 12:57 PM   If 7PM-7AM, please contact night-coverage

## 2021-02-13 NOTE — Evaluation (Addendum)
Physical Therapy Evaluation Patient Details Name: [name]. MRN: [id number] DOB: [birth year] Today's Date: 02/13/2021  History of Present Illness  [name]  is a 50 y.o. male, with history of ESRD on hemodialysis, CHF, diabetes mellitus type 2, GERD, gait instability requiring walker, hypertension, hyperlipidemia, A. fib, and more presents ED with a chief complaint of change in speech.  Wife is at bedside and patient requested mostly to be taken from her.  She reports she noticed him slurring his speech and stuttering approximately 24 hours prior to arrival.  The symptoms started at 5 PM on the 21st.  When he went to dialysis he had rigors and [facility] called EMS.  They were concerned that his osteomyelitis was not responding to treatment.  Patient recently had a finger amputated and was started on Bactrim for osteomyelitis.  Wife reports that the change in speech is worse since it started, but intermittent.  When he has changes speech to last minutes, and sometimes it is an hour between episodes.  He has not complained of any headaches.  She has not noticed a facial droop he has not had any choking or swallowing difficulties.  He has not complained of change in vision or hearing.  He has no family or personal history of stroke.  He does have family history of heart disease.  Patient reports compliance with all of his medications.  Patient has no other complaints.   Clinical Impression  Patient functioning near baseline for functional mobility and gait other than having near fall mostly due to shoes not fitting properly, after putting on non-skid socks patient able to safely ambulate in room and hallway without use of an AD and no loss of balance.  Patient encouraged to ambulate in room ad lib and with nursing staff in hallways using non-skid socks and acknowledges understanding not to use his crock shoes (don't fit) due to fall risk.  Plan:  Patient discharged from physical therapy to care  of nursing for ambulation daily as tolerated for length of stay.         Recommendations for follow up therapy are one component of a multi-disciplinary discharge planning process, led by the attending physician.  Recommendations may be updated based on patient status, additional functional criteria and insurance authorization.  Follow Up Recommendations No PT follow up    Assistance Recommended at Discharge    Functional Status Assessment Patient has had a recent decline in their functional status and/or demonstrates limited ability to make significant improvements in function in a reasonable and predictable amount of time  Equipment Recommendations  None recommended by PT    Recommendations for Other Services       Precautions / Restrictions Precautions Precautions: Fall Precaution Comments: LUE dialysis fistula Restrictions Weight Bearing Restrictions: No      Mobility  Bed Mobility Overal bed mobility: Modified Independent                  Transfers Overall transfer level: Modified independent Equipment used: Straight cane               General transfer comment: Pt able to ambulate in room and hall with and without cane. Pt ambulated well without cane once changed into socks rather than rubber shoes.    Ambulation/Gait Ambulation/Gait assistance: Supervision Gait Distance (Feet): 150 Feet Assistive device: Straight cane;None Gait Pattern/deviations: Decreased step length - right;Decreased step length - left;Decreased stride length Gait velocity: decreased     General Gait Details: had  near loss of balance during first trial of ambulation around nursing station mostly due to wearing crock shoes that are to big, demonstrates good return for ambulation in room and hallways wearing non-skid socks without loss of balance using SPC and without using AD  Stairs            Wheelchair Mobility    Modified Rankin (Stroke Patients Only)        Balance Overall balance assessment: Needs assistance Sitting-balance support: Feet supported;No upper extremity supported Sitting balance-Leahy Scale: Good Sitting balance - Comments: seated at EOB   Standing balance support: During functional activity;No upper extremity supported;Single extremity supported   Standing balance comment: fair/good using SPC and without AD                             Pertinent Vitals/Pain Pain Assessment: No/denies pain    Home Living Family/patient expects to be discharged to:: Private residence Living Arrangements: Spouse/significant other Available Help at Discharge: Family;Available PRN/intermittently Type of Home: House Home Access: Ramped entrance       Home Layout: One level Home Equipment: Conservation officer, nature (2 wheels);Other (comment);BSC/3in1      Prior Function Prior Level of Function : Needs assist       Physical Assist : Mobility (physical);ADLs (physical)     Mobility Comments: Pt ambulates with cane. ADLs Comments: Pt is assisted by wife for lower body dressing since recent procedure to R ring finger. Indpendent for other ADL's and IADl's per report.     Hand Dominance   Dominant Hand: Left    Extremity/Trunk Assessment   Upper Extremity Assessment Upper Extremity Assessment: Defer to OT evaluation RUE Deficits / Details: R wrist and hand wrapped from previous procedure on R ring finger. Pt demosntrates 4+/5 MMt shoulder and elbow. RUE Sensation: WNL (No reports of numbness.) RUE Coordination: decreased fine motor LUE Deficits / Details: 4-/5 shoulder flexion and abduction. 4-/5 elbow flexion and extension. 5/5 wrist felxion, extension, and grip. LUE Sensation: WNL LUE Coordination: WNL    Lower Extremity Assessment Lower Extremity Assessment: Overall WFL for tasks assessed (slightly decreased strength bilateral hip flexors -4/5)    Cervical / Trunk Assessment Cervical / Trunk Assessment: Normal   Communication   Communication: No difficulties  Cognition Arousal/Alertness: Awake/alert Behavior During Therapy: WFL for tasks assessed/performed Overall Cognitive Status: Within Functional Limits for tasks assessed                                 General Comments: blind left eye        General Comments      Exercises     Assessment/Plan    PT Assessment Patient does not need any further PT services  PT Problem List         PT Treatment Interventions      PT Goals (Current goals can be found in the Care Plan section)  Acute Rehab PT Goals Patient Stated Goal: return home with family to assist PT Goal Formulation: With patient Time For Goal Achievement: 02/13/21 Potential to Achieve Goals: Good    Frequency     Barriers to discharge        Co-evaluation PT/OT/SLP Co-Evaluation/Treatment: Yes Reason for Co-Treatment: To address functional/ADL transfers PT goals addressed during session: Mobility/safety with mobility;Balance;Proper use of DME OT goals addressed during session: ADL's and self-care  AM-PAC PT "6 Clicks" Mobility  Outcome Measure Help needed turning from your back to your side while in a flat bed without using bedrails?: None Help needed moving from lying on your back to sitting on the side of a flat bed without using bedrails?: None Help needed moving to and from a bed to a chair (including a wheelchair)?: None Help needed standing up from a chair using your arms (e.g., wheelchair or bedside chair)?: None Help needed to walk in hospital room?: A Little Help needed climbing 3-5 steps with a railing? : A Little 6 Click Score: 22    End of Session   Activity Tolerance: Patient tolerated treatment well Patient left: in bed;with call bell/phone within reach Nurse Communication: Mobility status PT Visit Diagnosis: Unsteadiness on feet (R26.81);Other abnormalities of gait and mobility (R26.89);Repeated falls (R29.6)    Time:  4695-0722 PT Time Calculation (min) (ACUTE ONLY): 19 min   Charges:   PT Evaluation $PT Eval Low Complexity: 1 Low PT Treatments $Gait Training: 8-22 mins        12:27 PM, 02/13/21 Lonell Grandchild, MPT Physical Therapist with [facility] [phone] office [phone] mobile phone

## 2021-02-13 NOTE — ED Notes (Signed)
Patient transported to MRI 

## 2021-02-13 NOTE — Procedures (Signed)
Patient Name: [name].  MRN: [id number]  Epilepsy Attending: Lora Havens  Referring Physician/Provider: Dr Zeb Comfort Date: 02/13/2021 Duration: 22.48 mins  Patient history: 50 year old male with history of atrial fibrillation on Eliquis who presented with transient speech disturbance and MRI brain showed restricted diffusion in left hippocampus. EEG to evaluate for seizure  Level of alertness: Awake, asleep  AEDs during EEG study: None  Technical aspects: This EEG study was done with scalp electrodes positioned according to the 10-20 International system of electrode placement. Electrical activity was acquired at a sampling rate of 500Hz  and reviewed with a high frequency filter of 70Hz  and a low frequency filter of 1Hz . EEG data were recorded continuously and digitally stored.   Description: The posterior dominant rhythm consists of 8 Hz activity of moderate voltage (25-35 uV) seen predominantly in posterior head regions, symmetric and reactive to eye opening and eye closing. Sleep was characterized by vertex waves, sleep spindles (12 to 14 Hz), maximal frontocentral region. Hyperventilation and photic stimulation were not performed.     IMPRESSION: This study is within normal limits. No seizures or epileptiform discharges were seen throughout the recording.  [name]

## 2021-02-13 NOTE — Evaluation (Signed)
Occupational Therapy Evaluation Patient Details Name: [name]. MRN: [id number] DOB: Nov 18, 1970 Today's Date: 02/13/2021   History of Present Illness Pt is a 50 y/o M admitted on 02/04/21 after presenting with R 4th digit osteomyelitis after recent hospitalization 12/8-12/9. Imaging from 12/14 revealed new destructive changes involving base of ring finger proximal phalanx compatible with acute osteo. Pt is s/p RUE angiogram with angioplasty of distal right ulnar artery & proximal right radial artery & s/p coil embolization of the right brachiocephalic AV fistula as well as large cephalic vein branch and placement of tunneled HD cath. PMH: ESRD (HD on T/Th), HTN, DM2, HTN, HLD, gastroparesis, CAD   Clinical Impression   Pt agreeable to OT/PT co-evaluation. Pt able to complete bed mobility with modified independence. Pt demonstrates mild to moderate weakness in L side compared to R and was unable to report of weakness was normal or not. Pt demonstrated loss of balance during ambulation while wearing rubber shoes, once socks were used instead of shoes the pt demonstrated improved balance and ability to ambulate without cane and no loss of balance. Pt would benefit from outpatient OT follow up on weakness in L UE. Pt will benefit from continued OT in the hospital and recommended venue below to increase strength, balance, and endurance for safe ADL's.        Recommendations for follow up therapy are one component of a multi-disciplinary discharge planning process, led by the attending physician.  Recommendations may be updated based on patient status, additional functional criteria and insurance authorization.   Follow Up Recommendations  Outpatient OT    Assistance Recommended at Discharge PRN  Functional Status Assessment  Patient has had a recent decline in their functional status and demonstrates the ability to make significant improvements in function in a reasonable and predictable  amount of time.  Equipment Recommendations  None recommended by OT           Precautions / Restrictions Precautions Precautions: Fall Restrictions Weight Bearing Restrictions: No      Mobility Bed Mobility Overal bed mobility: Modified Independent                  Transfers Overall transfer level: Modified independent Equipment used: Straight cane               General transfer comment: Pt able to ambulate in room and hall with and without cane. Pt ambulated well without cane once changed into socks rather than rubber shoes.      Balance Overall balance assessment: Mild deficits observed, not formally tested                                         ADL either performed or assessed with clinical judgement   ADL Overall ADL's : Needs assistance/impaired                     Lower Body Dressing: Maximal assistance;Sitting/lateral leans Lower Body Dressing Details (indicate cue type and reason): Assisted today. Wife reportedly has been helping at home since recent R hand procedure. Toilet Transfer: Independent;Ambulation;Modified Independent Toilet Transfer Details (indicate cue type and reason): simulated via ambulation in hall from EOB and return to EOB         Functional mobility during ADLs: Modified independent;Independent;Cane General ADL Comments: Kasandra Knudsen used for first rep of functional ambulation with pt losing balance due to shoes  sticking to floor surface. Pt changed to socks and was walking indepndently without cane.     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                Pertinent Vitals/Pain Pain Assessment: No/denies pain     Hand Dominance Left   Extremity/Trunk Assessment  Upper Extremity Assessment Upper Extremity Assessment: RUE deficits/detail;LUE deficits/detail RUE Deficits / Details: R wrist and hand wrapped from previous procedure on R ring finger. Pt demosntrates 4+/5 MMt shoulder and elbow. RUE Sensation: WNL (No reports of numbness.) RUE Coordination: decreased fine motor LUE Deficits / Details: 4-/5 shoulder flexion and abduction. 4-/5 elbow flexion and extension. 5/5 wrist felxion, extension, and grip. LUE Sensation: WNL LUE Coordination: WNL   Lower Extremity Assessment Lower Extremity Assessment: Defer to PT evaluation   Cervical / Trunk Assessment Cervical / Trunk Assessment: Normal   Communication Communication Communication: No difficulties   Cognition Arousal/Alertness: Awake/alert Behavior During Therapy: WFL for tasks assessed/performed Overall Cognitive Status: Within Functional Limits for tasks assessed                                                        Home Living Family/patient expects to be discharged to:: Private residence Living Arrangements: Spouse/significant other Available Help at Discharge: Family;Available PRN/intermittently Type of Home: House Home Access: Ramped entrance     Home Layout: One level     Bathroom Shower/Tub: Hospital doctor Toilet: Handicapped height     Home Equipment: Conservation officer, nature (2 wheels);Other (comment);BSC/3in1 (3 point cane)          Prior Functioning/Environment Prior Level of Function : Needs assist       Physical Assist : Mobility (physical);ADLs (physical)     Mobility Comments: Pt ambulates with cane. ADLs Comments: Pt is assisted by wife for lower body dressing since recent procedure to R ring finger. Indpendent for other ADL's and IADl's per report.                      OT Goals(Current goals can be found in the care plan section) Acute Rehab OT Goals Patient Stated Goal: return home                    Co-evaluation PT/OT/SLP Co-Evaluation/Treatment: Yes Reason for Co-Treatment: To address functional/ADL transfers   OT goals addressed during session: ADL's and self-care      AM-PAC OT "6 Clicks" Daily Activity     Outcome Measure Help from another person eating meals?: None Help from another person taking care of personal grooming?: None Help from another person toileting, which includes using toliet, bedpan, or urinal?: None Help from another person bathing (including washing, rinsing, drying)?: A Little Help from another person to put on and taking off regular upper body clothing?: None Help from another person to put on and taking off regular lower body clothing?: A Lot 6 Click Score: 21   End of Session Equipment Utilized During Treatment:  (cane)  Activity Tolerance: Patient tolerated treatment well Patient left: in bed;with call bell/phone within reach  OT Visit Diagnosis:  Unsteadiness on feet (R26.81);Other abnormalities of gait and mobility (R26.89);Muscle weakness (generalized) (M62.81)                Time: 5284-1324 OT Time Calculation (min): 15 min Charges:  OT General Charges $OT Visit: 1 Visit OT Evaluation $OT Eval Low Complexity: 1 Low  Charisa Twitty OT, MOT  Larey Seat 02/13/2021, 10:07 AM

## 2021-02-13 NOTE — Consult Note (Signed)
I connected with  [name]. on 02/13/21 by a video enabled telemedicine application and verified that I am speaking with the correct person using two identifiers.   I discussed the limitations of evaluation and management by telemedicine. The patient expressed understanding and agreed to proceed.  Location of patient: [facility] Location of physician: [facility]   Neurology Consultation Reason for Consult: stroke Referring Physician: Dr Gerlean Ren  CC: slurred speech  History is obtained from: patient, chart review  HPI: [name]. is a 50 y.o. male with past medical history of end-stage renal disease on hemodialysis, congestive heart failure, type 2 diabetes, hypertension, hyperlipidemia, atrial fibrillation on Eliquis who presented with transient slurred speech.  Patient and wife state they were at the hospital for his amputation.  They think he was last normal on Monday, 02/09/2021 when they came to the hospital for surgery. On Tuesday he seemed a little confused but he thought this could be due to surgery. They went home where family noted intermittent episodes of slurred speech.  Because it was not getting better and eventually came to the hospital yesterday.  MRI brain was performed which showed restricted diffusion in left hippocampus.  Patient wife state his speech has been slowly getting better but he still occasionally has trouble finding the right words.  Patient also reports intermittent jerking of left upper extremity yesterday, no episodes today.  Of note, patient's Eliquis was held prior to surgery.  Location of patient: [place] Last known normal: 02/09/2021 tPA not administered as patient outside window No thrombectomy as no large vessel occlusion Blood pressure on arrival 158/78  ROS: All other systems reviewed and negative except as noted in the HPI.   Past Medical History:  Diagnosis Date   Anxiety    Cellulitis     CHF (congestive heart failure) (HCC)    Chronic diarrhea    Chronic kidney disease    Chronic pain of both ankles    Diabetes mellitus without complication (HCC)    Diabetes mellitus, type II (HCC)    Diabetic neuropathy (HCC)    Frequent falls    Gait instability    GERD (gastroesophageal reflux disease)    Heart palpitations    Hemodialysis patient ([facility])    History of kidney stones    HLD (hyperlipidemia)    Hypertension    Insomnia    Nausea and vomiting in adult    recurrent    Family History  Problem Relation Age of Onset   CAD Father    Stroke Father    Diabetes Mellitus II Mother    Kidney failure Mother    Schizophrenia Mother     Social History:  reports that he has never smoked. He has never used smokeless tobacco. He reports that he does not drink alcohol and does not use drugs.    Exam: Current vital signs: BP (!) 171/80    Pulse 62    Temp 98.4 F (36.9 C) (Oral)    Resp 18    Ht 6\' 3"  (1.905 m)    Wt 93 kg    SpO2 99%    BMI 25.62 kg/m  Vital signs in last 24 hours: Temp:  [98.4 F (36.9 C)] 98.4 F (36.9 C) (12/22 2233) Pulse Rate:  [62-73] 62 (12/23 0730) Resp:  [16-18] 18 (12/23 0730) BP: (158-186)/(73-93) 171/80 (12/23 0730) SpO2:  [95 %-100 %] 99 % (12/23 0730) Weight:  [93 kg] 93 kg (12/22 2238)  Physical Exam  Constitutional: Appears well-developed and well-nourished.  Psych: Affect appropriate to situation Eyes: No scleral injection, left eye blindness HENT: No OP obstrucion Head: Normocephalic.  Cardiovascular: Normal rate and regular rhythm.  Respiratory: Effort normal, non-labored breathing Neuro: AOx3, cranial nerves 2-12 grossly intact except left eye blindness, no facial asymmetry, facial sensation intact bilaterally, antigravity strength in all 4 extremities without drift, FTN intact bilaterally  I have reviewed labs in epic and the results pertinent to this consultation are: CBC:  Recent Labs  Lab 02/08/21 0757  02/09/21 0221 02/12/21 2303 02/12/21 2326 02/13/21 0124  WBC 15.2*   < > 10.5  --  10.4  NEUTROABS 12.0*  --  7.1  --   --   HGB 7.4*   < > 7.7* 8.5* 7.5*  HCT 24.3*   < > 26.6* 25.0* 25.9*  MCV 76.9*   < > 83.6  --  84.1  PLT 161   < > 199  --  200   < > = values in this interval not displayed.    Basic Metabolic Panel:  Lab Results  Component Value Date   NA 144 02/13/2021   K 3.9 02/13/2021   CO2 19 (L) 02/13/2021   GLUCOSE 81 02/13/2021   BUN 48 (H) 02/13/2021   CREATININE 11.42 (H) 02/13/2021   CALCIUM 8.3 (L) 02/13/2021   GFRNONAA 5 (L) 02/13/2021   GFRAA 5 (L) 08/27/2019   Lipid Panel:  Lab Results  Component Value Date   LDLCALC 38 02/13/2021   HgbA1c:  Lab Results  Component Value Date   HGBA1C 5.7 (H) 02/13/2021   Urine Drug Screen:     Component Value Date/Time   LABOPIA NONE DETECTED 10/14/2017 1800   COCAINSCRNUR NONE DETECTED 10/14/2017 1800   COCAINSCRNUR NONE DETECTED 07/18/2017 1652   LABBENZ NONE DETECTED 10/14/2017 1800   AMPHETMU NONE DETECTED 10/14/2017 1800   THCU NONE DETECTED 10/14/2017 1800   LABBARB NONE DETECTED 10/14/2017 1800    Alcohol Level     Component Value Date/Time   ETH <10 02/12/2021 2328     I have reviewed the images obtained:  MRI brain without contrast 02/13/2021:Punctate acute infarct of the left hippocampus. Mild chronic microvascular ischemic changes.  MR a angio head 02/14/2019: No large vessel occlusion, proximal intracranial vessel occlusion or significant stenosis.     ASSESSMENT/PLAN: 50 year old male with history of atrial fibrillation on Eliquis who presented with transient speech disturbance and MRI brain showed restricted diffusion in left hippocampus.  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  Could be jerks due to recent osteomyelitis and antibiotics  Recommendations: -Routine EEG to assess for any potential epileptogenicity -Patient already on Eliquis and Plavix therefore will not add another antiplatelet (aspirin) -Continue atorvastatin 40 mg daily for secondary stroke prevention -Goal normotension -Management of rest of comorbidities per primary team  Thank you for allowing [place] to participate in the care of this patient. If you have any further questions, please contact  me or neurohospitalist.   Zeb Comfort Epilepsy [facility] neuro[facility]ist

## 2021-02-13 NOTE — ED Notes (Signed)
Holding home meds at this time until hospitalist responds with recommendation.

## 2021-02-13 NOTE — Progress Notes (Signed)
EEG completed, results pending. 

## 2021-02-13 NOTE — Evaluation (Signed)
Speech Language Pathology Evaluation Patient Details Name: [name] Venezia. MRN: [id number] DOB: 1970-07-14 Today's Date: 02/13/2021 Time: 8315-1761 SLP Time Calculation (min) (ACUTE ONLY): 28 min  Problem List:  Patient Active Problem List   Diagnosis Date Noted   CVA (cerebral vascular accident) ([place]) 02/13/2021   Steal syndrome as complication of dialysis access ([place]) 02/06/2021   Folate deficiency 02/06/2021   Hyperkalemia 02/05/2021   Hyperphosphatemia 02/05/2021   Hypermagnesemia 02/05/2021   Coronary artery disease 02/05/2021   Hypernatremia 02/05/2021   Osteomyelitis ([place]) 02/05/2021   Wound infection 02/04/2021   Infected wound 01/29/2021   Wound dehiscence, surgical, initial encounter 01/29/2021   ESRD on hemodialysis ([place])    Prolonged Q-T interval on ECG    Anemia due to chronic kidney disease, on chronic dialysis ([place])    Atrial fibrillation with RVR ([place]) 12/08/2020   Finger amputation, 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 60/73/7106   Atrial fibrillation with rapid ventricular response (HCC)    Gangrene of finger of right hand ([place]) 11/25/2020   Postural dizziness with presyncope 11/24/2020   Elevated troponin 11/24/2020   Left leg cellulitis 11/24/2020   Frequent falls 11/24/2020   Osteomyelitis of finger of right hand ([place]) 11/24/2020   AF (paroxysmal atrial fibrillation) ([place]) 11/24/2020   Hypotension 11/24/2020   NSTEMI (non-ST elevated myocardial infarction) ([place]) 11/24/2020   Abnormal gait 08/19/2020   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[place]) 08/19/2020   Anxiety state 08/19/2020   Callosity 08/19/2020   ED (erectile dysfunction) of organic origin 08/19/2020   Heart failure ([place]) 08/19/2020   Long term (current) use of insulin ([place]) 08/19/2020   Myalgia 08/19/2020   Obstructive uropathy 08/19/2020   Reactive depression 08/19/2020   History of ST elevation myocardial infarction (STEMI) 02/2020 s/p DES to LAD 08/19/2020   ESRD (end stage renal disease) ([place]) 08/19/2020    Blindness of left eye 08/15/2020   Combined forms of age-related cataract of right eye 04/17/2020   Supraventricular tachycardia ([place]) 02/26/2020   Acute anterior wall MI ([place])    Benign prostatic hyperplasia with lower urinary tract symptoms 11/07/2019   Fluid overload, unspecified 08/15/2019   Hypertension secondary to other renal disorders 07/20/2019   NVG (neovascular glaucoma), left, 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   Anemia 06/05/2019   Aftercare including intermittent dialysis ([place]) 05/15/2019   Moderate protein-calorie malnutrition ([place]) 05/14/2019   Anaphylactic reaction due to adverse effect of correct drug or medicament properly administered, initial encounter 05/08/2019   Chronic anemia 05/07/2019   Chest pain, unspecified 05/07/2019   Coagulation defect, unspecified ([place]) 05/07/2019   Disorder of phosphorus metabolism, unspecified 05/07/2019   Encounter for fitting and adjustment of extracorporeal dialysis catheter ([place]) 05/07/2019   Gastroparesis 05/07/2019   Iron deficiency anemia, unspecified 05/07/2019   Major depressive disorder, recurrent, unspecified ([place]) 05/07/2019   Pain, unspecified 05/07/2019   Pruritus, unspecified 05/07/2019   Dizziness 26/94/8546   Metabolic acidosis 27/04/5007   Syncope 04/06/2019   Urinary retention 04/06/2019   Hypertensive chronic kidney disease with stage 1 through stage 4 chronic kidney disease, or unspecified chronic kidney disease 11/28/2018   Edema 11/28/2018   Hypokalemia 11/28/2018   Hypo-osmolality and hyponatremia 11/28/2018   Proteinuria 11/28/2018   Secondary hyperparathyroidism of renal origin ([place]) 11/28/2018   Lymphedema 01/09/2018   Chronic diastolic heart failure ([place]) 12/13/2017   Vomiting 11/28/2017   Acute on chronic heart failure ([place]) 11/23/2017   Diabetes mellitus, type II ([place]) 11/23/2017   Diabetic retinopathy ([place]) 07/05/2017   Uncontrolled type 2 diabetes mellitus with  hyperglycemia ([place]) 07/08/2016    Uncontrolled type 2 diabetes mellitus with hyperglycemia, with long-term current use of insulin ([place]) 04/20/2016   Osteomyelitis of toe of left foot ([place]) 03/15/2016   Acute renal failure with tubular necrosis ([place]) 06/23/2015   Diarrhea 06/23/2015   Mixed hyperlipidemia    Essential hypertension, benign    Heart palpitations 05/06/2015   Sepsis ([place]) 03/07/2015   Past Medical History:  Past Medical History:  Diagnosis Date   Anxiety    Cellulitis    CHF (congestive heart failure) (HCC)    Chronic diarrhea    Chronic kidney disease    Chronic pain of both ankles    Diabetes mellitus without complication (HCC)    Diabetes mellitus, type II (HCC)    Diabetic neuropathy (HCC)    Frequent falls    Gait instability    GERD (gastroesophageal reflux disease)    Heart palpitations    Hemodialysis patient ([place])    History of kidney stones    HLD (hyperlipidemia)    Hypertension    Insomnia    Nausea and vomiting in adult    recurrent   Past Surgical History:  Past Surgical History:  Procedure Laterality Date   AMPUTATION Left 03/16/2016   Procedure: AMPUTATION DIGIT LEFT HALLUX;  Surgeon: Trula Slade, DPM;  Location: [facility];  Service: Podiatry;  Laterality: Left;  can start around 5    AMPUTATION Right 02/09/2021   Procedure: AMPUTATION DIGIT;  Surgeon: Sherilyn Cooter, MD;  Location: [facility];  Service: Orthopedics;  Laterality: Right;   ARTHROSCOPIC REPAIR ACL Left    AV FISTULA PLACEMENT Right 05/31/2019   Procedure: Brachiocephalic AV fistula creation;  Surgeon: Algernon Huxley, MD;  Location: [name];  Service: Vascular;  Laterality: Right;   COLONOSCOPY WITH PROPOFOL N/A 10/28/2015   Procedure: COLONOSCOPY WITH PROPOFOL;  Surgeon: Lollie Sails, MD;  Location: [facility] ENDOSCOPY;  Service: Endoscopy;  Laterality: N/A;   COLONOSCOPY WITH PROPOFOL N/A 10/29/2015   Procedure: COLONOSCOPY WITH PROPOFOL;  Surgeon: Lollie Sails, MD;  Location: [facility] ENDOSCOPY;  Service: Endoscopy;   Laterality: N/A;   CORONARY/GRAFT ACUTE MI REVASCULARIZATION N/A 02/26/2020   Procedure: Coronary/Graft Acute MI Revascularization;  Surgeon: Jettie Booze, MD;  Location: [facility];  Service: Cardiovascular;  Laterality: N/A;   DIALYSIS/PERMA CATHETER INSERTION Right 04/26/2019   Perm Cath    DIALYSIS/PERMA CATHETER INSERTION N/A 04/26/2019   Procedure: DIALYSIS/PERMA CATHETER INSERTION;  Surgeon: Algernon Huxley, MD;  Location: [facility];  Service: Cardiovascular;  Laterality: N/A;   DIALYSIS/PERMA CATHETER REMOVAL N/A 08/15/2019   Procedure: DIALYSIS/PERMA CATHETER REMOVAL;  Surgeon: Katha Cabal, MD;  Location: [facility];  Service: Cardiovascular;  Laterality: N/A;   EMBOLIZATION Right 02/06/2021   Procedure: EMBOLIZATION;  Surgeon: Algernon Huxley, MD;  Location: [facility];  Service: Cardiovascular;  Laterality: Right;  Right Upper Extremity Dialysis Access, Permcath Placement   ESOPHAGOGASTRODUODENOSCOPY (EGD) WITH PROPOFOL N/A 12/27/2017   Procedure: ESOPHAGOGASTRODUODENOSCOPY (EGD) WITH PROPOFOL;  Surgeon: Toledo, Benay Pike, MD;  Location: [name] ENDOSCOPY;  Service: Gastroenterology;  Laterality: N/A;   INTRAVASCULAR ULTRASOUND/IVUS N/A 02/26/2020   Procedure: Intravascular Ultrasound/IVUS;  Surgeon: Jettie Booze, MD;  Location: [facility];  Service: Cardiovascular;  Laterality: N/A;   LEFT HEART CATH AND CORONARY ANGIOGRAPHY N/A 02/26/2020   Procedure: LEFT HEART CATH AND CORONARY ANGIOGRAPHY;  Surgeon: Jettie Booze, MD;  Location: [facility];  Service: Cardiovascular;  Laterality:  N/A;   PROSTATE SURGERY  2016   TONSILECTOMY/ADENOIDECTOMY WITH MYRINGOTOMY     TONSILLECTOMY     UPPER EXTREMITY ANGIOGRAPHY Right 11/26/2020   Procedure: Upper Extremity Angiography;  Surgeon: Algernon Huxley, MD;  Location: [facility];  Service: Cardiovascular;  Laterality: Right;   UPPER EXTREMITY ANGIOGRAPHY Right 02/05/2021    Procedure: UPPER EXTREMITY ANGIOGRAPHY;  Surgeon: Algernon Huxley, MD;  Location: [facility];  Service: Cardiovascular;  Laterality: Right;   HPI:  50 y.o. male, with history of ESRD on hemodialysis, CHF, diabetes mellitus type 2, GERD, gait instability requiring walker, hypertension, hyperlipidemia, A. fib, and more presents ED with a chief complaint of change in speech.  Wife is at bedside and patient requested mostly to be taken from her.  She reports she noticed him slurring his speech and stuttering approximately 24 hours prior to arrival.  The symptoms started at 5 PM on the 21st.  When he went to dialysis he had rigors and [facility] called EMS.  They were concerned that his osteomyelitis was not responding to treatment.  Patient recently had a finger amputated and was started on Bactrim for osteomyelitis.  Wife reports that the change in speech is worse since it started, but intermittent.  When he has changes speech to last minutes, and sometimes it is an hour between episodes.  He has not complained of any headaches.  She has not noticed a facial droop he has not had any choking or swallowing difficulties.  He has not complained of change in vision or hearing.  He has no family or personal history of stroke.  He does have family history of heart disease.  Patient reports compliance with all of his medications.  Patient has no other complaints.02/13/21 MRI brain revealed Punctate acute infarct of the left hippocampus. Correlate for  transient global amnesia; pt passed [place], but NPO status.  Discussed with nursing and regular/thin liquid diet ordered for pt.  SLE generated d/t speech concerns.   Assessment / Plan / Recommendation Clinical Impression  Pt seen for speech/language evaluation with SLUMS ([place] Mental Status Examination) with the following score elicited 13 /81(XBJY graphic expression eliminated d/t limited vision) with pt being Ox4, able to state items within a category  accurately and intelligible speech noted within complex conversation. Pt with noted difficulties within the areas of memory affecting recall with time constraint (2/5 without category cues) and comprehension questions after short paragraph eliciting 78% accuracy.  Attention tasks decreased with simple calculation task (may be d/t overanalysis) and repeating digits backwards past 3 digit level.  [name] memory deficits over past few weeks since recent finger amputation sx.  Dysarthric speech improved within complex conversation with L facial weakness noted at rest, but not affecting overall swallowing and/or speech clarity.  Recommend ST f/u for cognitive deficits while in acute setting and potential OP SLP and/or HH SLP if memory/attention difficulties persist when d/c home.  Thank you for this consult.    SLP Assessment  SLP Recommendation/Assessment: Patient needs continued Speech Language Pathology Services SLP Visit Diagnosis: Cognitive communication deficit (R41.841);Attention and concentration deficit Attention and concentration deficit following: Cerebral infarction    Recommendations for follow up therapy are one component of a multi-disciplinary discharge planning process, led by the attending physician.  Recommendations may be updated based on patient status, additional functional criteria and insurance authorization.    Follow Up Recommendations  Home health SLP (and/or OP SLP)    Assistance Recommended at  Discharge  Intermittent Supervision/Assistance  Functional Status Assessment    Frequency and Duration min 1 x/week  1 week      SLP Evaluation Cognition  Overall Cognitive Status: Impaired/Different from baseline Arousal/Alertness: Awake/alert Orientation Level: Oriented X4 Year: 2022 Month: December Day of Week: Correct Attention: Sustained Sustained Attention: Impaired Sustained Attention Impairment: Verbal basic;Functional basic Memory: Impaired Memory Impairment:  Retrieval deficit;Decreased recall of new information;Decreased short term memory Decreased Short Term Memory: Verbal basic;Functional basic Immediate Memory Recall: Sock;Blue;Bed Memory Recall Sock: Without Cue Memory Recall Blue: Without Cue Memory Recall Bed: Without Cue Awareness: Appears intact Problem Solving: Appears intact Behaviors: Perseveration Safety/Judgment: Appears intact       Comprehension  Auditory Comprehension Overall Auditory Comprehension: Appears within functional limits for tasks assessed Yes/No Questions: Within Functional Limits Commands: Within Functional Limits Conversation: Complex Interfering Components: Working Field seismologist: Repetition Retail banker: Not tested Reading Comprehension Reading Status: Unable to assess (comment) (Pt does not have reading glasses for right eye and L he has no vision)    Expression Expression Primary Mode of Expression: Verbal Verbal Expression Overall Verbal Expression: Impaired Initiation: No impairment Level of Generative/Spontaneous Verbalization: Conversation Repetition: No impairment Naming: Impairment Responsive: 76-100% accurate Confrontation: Within functional limits Convergent: 50-74% accurate Divergent: 50-74% accurate Verbal Errors: Perseveration Pragmatics: No impairment Non-Verbal Means of Communication: Not applicable Written Expression Dominant Hand: Left Written Expression: Unable to assess (comment) (visual deficits; refused)   Oral / Motor  Oral Motor/Sensory Function Overall Oral Motor/Sensory Function: Mild impairment Facial ROM: Reduced left;Other (Comment) (at rest; functional for swallowing/speech) Facial Symmetry: Abnormal symmetry left;Other (Comment) (at rest) Motor Speech Overall Motor Speech: Appears within functional limits for tasks assessed Respiration: Within functional limits Phonation: Normal Resonance: Within functional  limits Articulation: Within functional limitis Intelligibility: Intelligible Motor Planning: Witnin functional limits Motor Speech Errors: Not applicable            Elvina Sidle, M.S., CCC-SLP 02/13/2021, 1:48 PM

## 2021-02-15 LAB — AEROBIC/ANAEROBIC CULTURE W GRAM STAIN (SURGICAL/DEEP WOUND)
Gram Stain: NONE SEEN
Gram Stain: NONE SEEN

## 2021-02-18 LAB — CULTURE, BLOOD (ROUTINE X 2)
Culture: NO GROWTH
Culture: NO GROWTH
Special Requests: ADEQUATE
Special Requests: ADEQUATE

## 2021-02-24 ENCOUNTER — Other Ambulatory Visit (HOSPITAL_COMMUNITY): Payer: Self-pay

## 2021-03-03 ENCOUNTER — Telehealth: Payer: Self-pay | Admitting: Interventional Cardiology

## 2021-03-03 NOTE — Telephone Encounter (Signed)
I spoke with patient who gave permission to speak with [place].  [name] reports patient is in [place] being evaluated for kidney transplant.  Needs to have nuclear stress test and cannot be done in [place] before returning to [place] later this week.  [name] is requesting this test be done in [place].  I asked her to have physician requesting test send information to Dr Irish Lack.  Fax number for our office provided.

## 2021-03-03 NOTE — Telephone Encounter (Signed)
Pt is needing to be contacted.. currently at [facility] in [facility] and is wanting to have an order put in for a Vascular test at our office when pt returns and have results faxed over to the clinic there... please contact the pt to further clarify.Marland KitchenMarland Kitchen

## 2021-03-11 ENCOUNTER — Telehealth: Payer: Self-pay | Admitting: Interventional Cardiology

## 2021-03-11 DIAGNOSIS — I25118 Atherosclerotic heart disease of native coronary artery with other forms of angina pectoris: Secondary | ICD-10-CM

## 2021-03-11 NOTE — Telephone Encounter (Signed)
Patient notified. I verbally went over instructions with patient.  He is aware to hold Coreg for 24 hours prior to stress test

## 2021-03-11 NOTE — Telephone Encounter (Signed)
Patient called stating he needs a stress test done, he would like Dr. Irish Lack to order him one, he also stated he will need a clearance for a kidney transplant.

## 2021-03-11 NOTE — Telephone Encounter (Signed)
OK to order nuclear stress test

## 2021-03-11 NOTE — Telephone Encounter (Signed)
See previous phone note.  We have not received any information regarding required testing.   Patient reports he needs a nuclear stress test prior to possible kidney transport.   Will send to Dr Irish Lack to see if OK to order.  Patient thinks he can walk on the treadmill. I asked patient to have physician's office send clearance request for transplant to our office.

## 2021-03-16 DIAGNOSIS — T85398A Other mechanical complication of other ocular prosthetic devices, implants and grafts, initial encounter: Secondary | ICD-10-CM | POA: Insufficient documentation

## 2021-03-17 ENCOUNTER — Telehealth: Payer: Self-pay | Admitting: *Deleted

## 2021-03-17 NOTE — Telephone Encounter (Signed)
Patient with diagnosis of afib on Eliquis for anticoagulation.    Procedure: kidney transplant Date of procedure: TBD  CHA2DS2-VASc Score = 6  This indicates a 9.7% annual risk of stroke. The patient's score is based upon: CHF History: 1 HTN History: 1 Diabetes History: 1 Stroke History: 2 Vascular Disease History: 1 Age Score: 0 Gender Score: 0   Notably, had CVA 1 month ago after holding anticoag for a few days for a finger amputation.  CrCl - on dialysis Platelet count 196K  Pt high CV risk off of anticoag especially in setting of CVA 1 month ago when he held Eliquis periprocedurally for a finger amputation. Lovenox not a great option since pt is on dialysis. Since he is high risk, may need to be admitted a few days early and started on heparin drip but will defer to MD for input.

## 2021-03-17 NOTE — Telephone Encounter (Deleted)
Patient with diagnosis of Afib on Eliquis for anticoagulation.    Procedure: Kidney Transplant Date of procedure: TBD  CHA2DS2-VASc Score = 4  This indicates a 4.8% annual risk of stroke. The patient's score is based upon: CHF History: 1 HTN History: 1 Diabetes History: 1 Stroke History: 0 Vascular Disease History: 1 Age Score: 0 Gender Score: 0  Recent CVA in 02/13/2021 when Eliquis held prior to surgery.  CrCl 10 mL/min (4 weeks ago) Platelet count 196k  Per office protocol, patient can hold Eliquis for 2-3 days prior to procedure.    Recommend bridge with Lovenox prior to surgery.

## 2021-03-17 NOTE — Telephone Encounter (Signed)
° °  Pre-operative Risk Assessment    Patient Name: [name].  DOB: 1970-10-11 MRN: [id number]      Request for Surgical Clearance    Procedure:   Kidney transplant  Date of Surgery:  Clearance TBD                                 Surgeon:  Not listed Surgeon's Group or Practice Name:  [place], [place] Phone number:  [phone] Fax number:  [phone]   Type of Clearance Requested:   - Pharmacy:  Hold Apixaban (Eliquis)     Type of Anesthesia:  Not Indicated   Additional requests/questions:    Gillermina Phy   03/17/2021, 12:26 PM

## 2021-03-18 ENCOUNTER — Emergency Department (HOSPITAL_COMMUNITY)
Admission: EM | Admit: 2021-03-18 | Discharge: 2021-03-18 | Disposition: A | Discharge status: Home / Self Care | Payer: Medicare HMO | Attending: Emergency Medicine | Admitting: Emergency Medicine

## 2021-03-18 ENCOUNTER — Other Ambulatory Visit: Payer: Self-pay

## 2021-03-18 DIAGNOSIS — Z992 Dependence on renal dialysis: Secondary | ICD-10-CM | POA: Diagnosis not present

## 2021-03-18 DIAGNOSIS — N186 End stage renal disease: Secondary | ICD-10-CM | POA: Diagnosis not present

## 2021-03-18 DIAGNOSIS — K649 Unspecified hemorrhoids: Secondary | ICD-10-CM | POA: Diagnosis present

## 2021-03-18 DIAGNOSIS — K640 First degree hemorrhoids: Secondary | ICD-10-CM | POA: Insufficient documentation

## 2021-03-18 MED ORDER — HYDROCORTISONE ACETATE 25 MG RE SUPP: 25.0000 mg | Freq: Two times a day (BID) | RECTAL | 0 refills | Status: DC

## 2021-03-18 NOTE — Discharge Instructions (Addendum)
As discussed, hemorrhoids can be frustrating and painful for weeks while the therapy works to alleviate your symptoms.  In addition to your prescribed steroids, please use sitz bath's, and Tucks for relief.  Please be sure to follow-up with your gastroenterologist.

## 2021-03-18 NOTE — ED Triage Notes (Addendum)
Pov from home with cc of hemorrhoids for the last few days. Has never had them before.  Says it hurts to sit. Has been having difficulty having BM. Is not on any stool softeners.    Has dialysis cath in left chest. Goes Tu/Th/Sat

## 2021-03-18 NOTE — ED Provider Notes (Signed)
[facility] EMERGENCY DEPARTMENT Provider Note   CSN: [id number] Arrival date & time: 03/18/21  1944     History  Chief Complaint  Patient presents with   Hemorrhoids    [name]. is a 51 y.o. male.  HPI Patient presents with concern of painful rectum.  Patient has multiple medical issues including chronic anticoagulation, end-stage renal disease on dialysis.  About 2 weeks ago developed hemorrhoids.  Since that time in spite of using Tucks wipes and topical steroids he has had worsening pain, particular with defecation.  He has continued to go to dialysis, denies fever, other pain.    Home Medications Prior to Admission medications   Medication Sig Start Date End Date Taking? Authorizing Provider  hydrocortisone (ANUSOL-HC) 25 MG suppository Place 1 suppository (25 mg total) rectally 2 (two) times daily. 03/18/21  Yes Carmin Muskrat, MD  acetaminophen (TYLENOL) 325 MG tablet Take 2 tablets (650 mg total) by mouth every 6 (six) hours as needed for mild pain (or Fever >/= 101). 01/30/21   Richarda Osmond, MD  amiodarone (PACERONE) 200 MG tablet Take 1 tablet (200 mg total) by mouth daily 02/10/21   Charlynne Cousins, MD  amLODipine (NORVASC) 5 MG tablet Take 1 tablet (5 mg total) by mouth daily. 02/10/21   Charlynne Cousins, MD  apixaban (ELIQUIS) 5 MG TABS tablet Take 1 tablet (5 mg total) by mouth 2 (two) times daily. 02/10/21   Charlynne Cousins, MD  artificial tears (LACRILUBE) OINT ophthalmic ointment Place into the left eye 4 (four) times daily. 02/08/21   Elodia Florence., MD  atorvastatin (LIPITOR) 80 MG tablet TAKE 1 TABLET (80 MG TOTAL) BY MOUTH AT BEDTIME. 02/28/20 02/27/21  Reino Bellis B, NP  azelastine (ASTELIN) 0.1 % nasal spray Place 2 sprays into both nostrils 2 (two) times daily. 09/08/20   [provider]  calcium acetate (PHOSLO) 667 MG capsule Take 3 capsules (2,001 mg total) by mouth 3 (three) times daily with meals. 11/27/20    Sharen Hones, MD  carvedilol (COREG) 25 MG tablet Take 1 tablet (25 mg total) by mouth 2 (two) times daily with a meal. 02/10/21   Charlynne Cousins, MD  clopidogrel (PLAVIX) 75 MG tablet Take 1 tablet (75 mg total) by mouth daily. 02/10/21   Charlynne Cousins, MD  FEROSUL 325 (65 Fe) MG tablet Take 325 mg by mouth daily. 09/08/20   [provider]  fluticasone (FLONASE) 50 MCG/ACT nasal spray Place 2 sprays into both nostrils daily. Patient not taking: Reported on 02/04/2021 09/08/20   [provider]  HYDROcodone-acetaminophen (NORCO/VICODIN) 5-325 MG tablet Take 2 tablets by mouth 2 (two) times daily. 01/23/21   [provider]  nitroGLYCERIN (NITROSTAT) 0.4 MG SL tablet Place 1 tablet (0.4 mg total) under the tongue every 5 (five) minutes as needed for chest pain. 12/09/20 12/09/21  Johnson, Clanford L, MD  pantoprazole (PROTONIX) 40 MG tablet Take 1 tablet (40 mg total) by mouth daily before breakfast. 02/14/21   Amin, Jeanella Flattery, MD  prednisoLONE acetate (PRED FORTE) 1 % ophthalmic suspension Place 1 drop into the left eye every 4 (four) hours.    [provider]  sulfamethoxazole-trimethoprim (BACTRIM DS) 800-160 MG tablet Take 1 tablet by mouth every other day. 02/10/21   Charlynne Cousins, MD  tamsulosin (FLOMAX) 0.4 MG CAPS capsule Take 0.4 mg by mouth daily after breakfast.     [provider]      Allergies  Levofloxacin, Promethazine, Other, and Malt    Review of Systems   Review of Systems  Constitutional:        Per HPI, otherwise negative  HENT:         Per HPI, otherwise negative  Respiratory:         Per HPI, otherwise negative  Cardiovascular:        Per HPI, otherwise negative  Gastrointestinal:  Negative for vomiting.  Endocrine:       Negative aside from HPI  Genitourinary:        Neg aside from HPI   Musculoskeletal:        Per HPI, otherwise negative  Skin: Negative.   Neurological:  Negative for  syncope.   Physical Exam Updated Vital Signs BP (!) 73/46    Pulse 70    Temp 97.8 F (36.6 C) (Oral)    Resp 19    Ht 6\' 3"  (1.905 m)    Wt 86.2 kg    SpO2 97%    BMI 23.75 kg/m  Physical Exam Vitals and nursing note reviewed. Exam conducted with a chaperone present.  Constitutional:      General: He is not in acute distress.    Appearance: He is well-developed.  HENT:     Head: Normocephalic and atraumatic.  Eyes:     Conjunctiva/sclera: Conjunctivae normal.  Pulmonary:     Effort: Pulmonary effort is normal.  Abdominal:     General: There is no distension.  Genitourinary:   Skin:    General: Skin is warm and dry.  Neurological:     Mental Status: He is alert and oriented to person, place, and time.    ED Results / Procedures / Treatments   Labs (all labs ordered are listed, but only abnormal results are displayed) Labs Reviewed - No data to display  EKG None  Radiology No results found.  Procedures Procedures    Medications Ordered in ED Medications - No data to display  ED Course/ Medical Decision Making/ A&P After initial valuation I reviewed the patient's chart, notable for evaluation yesterday with her cardiologist for clearance for anticipated kidney transplant.  With consideration of hemorrhoids, but no evidence for bacteremia, sepsis, patient received appropriate initiation of new medication, additional therapeutics, is appropriate for discharge with outpatient GI follow-up..  Care is the patient's ongoing end-stage renal disease, requiring for dialysis, and his gastroenterologist living in a different city.  Regardless, patient appropriate for discharge with outpatient follow-up as needed Final Clinical Impression(s) / ED Diagnoses Final diagnoses:  Grade I hemorrhoids    Rx / DC Orders ED Discharge Orders          Ordered    hydrocortisone (ANUSOL-HC) 25 MG suppository  2 times daily        03/18/21 2116              Carmin Muskrat,  MD 03/18/21 2119

## 2021-03-18 NOTE — ED Notes (Signed)
Pt denies having legal guardian.

## 2021-03-19 ENCOUNTER — Telehealth (HOSPITAL_COMMUNITY): Payer: Self-pay | Admitting: *Deleted

## 2021-03-19 NOTE — Telephone Encounter (Signed)
Patient given detailed instructions per Myocardial Perfusion Study Information Sheet for the test on 03/25/21  Patient notified to arrive 15 minutes early and that it is imperative to arrive on time for appointment to keep from having the test rescheduled.  If you need to cancel or reschedule your appointment, please call the office within 24 hours of your appointment. . Patient verbalized understanding. Kirstie Peri

## 2021-03-20 NOTE — Telephone Encounter (Signed)
I agree that admitting with IV heparin would be the safest option since he had a stroke when holding Eliquis previously.

## 2021-03-25 ENCOUNTER — Encounter (HOSPITAL_COMMUNITY): Payer: Medicare HMO

## 2021-03-25 ENCOUNTER — Encounter (HOSPITAL_COMMUNITY): Payer: Self-pay | Admitting: Interventional Cardiology

## 2021-04-06 ENCOUNTER — Telehealth (HOSPITAL_COMMUNITY): Payer: Self-pay | Admitting: *Deleted

## 2021-04-06 ENCOUNTER — Telehealth: Payer: Self-pay | Admitting: Interventional Cardiology

## 2021-04-06 NOTE — Telephone Encounter (Signed)
Per pt has noted B/P  running low for about a month and as a result is weak Per pt has lower readings on dialysis days Per pt taking Carvedilol 25 mg twice daily and Amiodarone 200 mg daily Will forward to Dr Irish Lack for review and recommendations ./cy

## 2021-04-06 NOTE — Telephone Encounter (Signed)
[name] BP issue: STAT if pt c/o blurred vision, one-sided weakness or slurred speech  1. What are your last 5 BP readings?  70/60 86/50   2. Are you having any other symptoms (ex. Dizziness, headache, blurred vision, passed out)?  Weakness   3. What is your BP issue?   Patient states his BP has been low. He does not have any readings but he states his systolic has gotten down to the 70's-80's. He states it usually drops that low when he has dialysis. If his call is returned tomorrow morning please contact dialysis clinic at [phone]. Otherwise, call his cell phone.

## 2021-04-06 NOTE — Telephone Encounter (Signed)
Left message on voicemail in reference to upcoming appointment scheduled for  04/08/21 Phone number given for a call back so details instructions can be given. [name]

## 2021-04-07 NOTE — Telephone Encounter (Signed)
Can skip carvedilol before dialysis.  COntinue other doses.  So he will only take an evening Carvedilol dose on dialysis days, twice a day on other days.  Let us know if he still has low readings.

## 2021-04-08 ENCOUNTER — Ambulatory Visit (HOSPITAL_COMMUNITY): Payer: Medicare HMO | Attending: Cardiovascular Disease

## 2021-04-08 ENCOUNTER — Encounter (INDEPENDENT_AMBULATORY_CARE_PROVIDER_SITE_OTHER): Payer: Medicare Other

## 2021-04-08 ENCOUNTER — Ambulatory Visit (INDEPENDENT_AMBULATORY_CARE_PROVIDER_SITE_OTHER): Payer: Medicare Other | Admitting: Nurse Practitioner

## 2021-04-09 NOTE — Telephone Encounter (Signed)
Patient notified

## 2021-04-10 ENCOUNTER — Telehealth: Payer: Self-pay | Admitting: *Deleted

## 2021-04-10 NOTE — Telephone Encounter (Signed)
° °  Pre-operative Risk Assessment    Patient Name: [name].  DOB: [birth year] MRN: [id number]      Request for Surgical Clearance    Procedure:   TOTAL PARATHYROIDECTOMY WITH REIMPLANTATION AND GLAND EXPLORATION  Date of Surgery:  Clearance TBD                                 Surgeon:  DR. Tiburcio Pea Surgeon's Group or Practice Name:  [facility] DEPT OF OTOLARYNGOLOGY HEAD AND NECK SURGERY Phone number:  [phone] Fax number:  [phone]   Type of Clearance Requested:   - Medical  - Pharmacy:  Hold Aspirin, Clopidogrel (Plavix), and Apixaban (Eliquis)     Type of Anesthesia:  General    Additional requests/questions:    [name]   04/10/2021, 5:21 PM

## 2021-04-13 NOTE — Telephone Encounter (Signed)
I s/w the pt and he is agreeable to plan of care for pre op appt at the [facility] location. Appt with Laurann Montana, NP 04/20/21 1:55. I asked the pt if he needed the address but he said no, as long as he get a text with the address. I informed the pt that if that is how he does get his appt reminders then he should get the text. I informed the pt that if decides he would like for [place] to give him the address, to just call back and we will be happy to give him the address to [facility] location. Pt said thank you for the help. I will forward notes to NP for upcoming appt. Will send FYI to surgeon's office pt has appt 04/20/21

## 2021-04-13 NOTE — Telephone Encounter (Signed)
° °  Name: Dishawn Bhargava.  DOB: [birth year]  MRN: [id number]  Primary Cardiologist: Larae Grooms, MD  Chart reviewed as part of pre-operative protocol coverage. Because of Rajinder Tutson Jr.'s past medical history and time since last visit, he will require a follow-up visit in order to better assess preoperative cardiovascular risk.  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 We have been asked to hold ASA, plavix, and eliquis for parathyroidectomy.    PLAVIX HOLD: will send to Dr. Irish Lack. Pt had anterior STEMI treated with DES 02/2020. He is on plavix monotherapy in the setting of eliquis.   ELIQUIS HOLD: will send to pharmD. Pt had CVA 01/2021 in the setting of holding [place] for amputation.    Request to hold ASA - patient is not on triple therapy.    Pre-op covering staff: - Please schedule appointment and call patient to inform them. If patient already had an upcoming appointment within acceptable timeframe, please add "pre-op clearance" to the appointment notes so provider is aware. - Please contact requesting surgeon's office via preferred method (i.e, phone, fax) to inform them of need for appointment prior to surgery.  If applicable, this message will also be routed to pharmacy pool and/or primary cardiologist for input on holding anticoagulant/antiplatelet agent as requested below so that this information is available to the clearing provider at time of patient's appointment.   Tami Lin Ashan Cueva, PA  04/13/2021, 1:43 PM

## 2021-04-13 NOTE — Telephone Encounter (Addendum)
Patient with diagnosis of afib on Eliquis for anticoagulation.     Procedure: total parathyroidectomy with reimplantation and gland exploration Date of procedure: TBD   CHA2DS2-VASc Score = 6  This indicates a 9.7% annual risk of stroke. The patient's score is based upon: CHF History: 1 HTN History: 1 Diabetes History: 1 Stroke History: 2 Vascular Disease History: 1 Age Score: 0 Gender Score: 0   Notably, had CVA 2 months ago after holding anticoag for a few days for a finger amputation.   CrCl - on dialysis Platelet count 196K   Pt high CV risk off of anticoag especially in setting of CVA 01/2021 when he held Eliquis periprocedurally for a finger amputation. Lovenox not a great option since pt is on dialysis. Since he is high risk, may need to be admitted a few days early and started on heparin drip. Dr Irish Lack agreed with this rec when pt was cleared just last month for a different procedure.

## 2021-04-14 ENCOUNTER — Telehealth (INDEPENDENT_AMBULATORY_CARE_PROVIDER_SITE_OTHER): Payer: Self-pay

## 2021-04-14 NOTE — Telephone Encounter (Signed)
Covering preop box today. Anticoag information now available for Laurann Montana to review at upcoming OV. Preop APP already routed this message to Dr. Irish Lack as well for input on holding Plavix as below - Dr. Irish Lack, you can route your reply to Urban Gibson so she is aware to relay when she sees the patient. Will remove from preop box.

## 2021-04-14 NOTE — Telephone Encounter (Signed)
The pt called and left a VM on the nurses line saying that his staples in his chest were coming out . Per the NP the pt should speak with his dialysis center and have them send over and order to either remover his perm cath or replace it. I called the pt and left this information on on his VM.

## 2021-04-17 ENCOUNTER — Encounter (HOSPITAL_BASED_OUTPATIENT_CLINIC_OR_DEPARTMENT_OTHER): Payer: Self-pay

## 2021-04-17 NOTE — Telephone Encounter (Signed)
Noted. Will address at upcoming follow up.   Loel Dubonnet, NP

## 2021-04-17 NOTE — Telephone Encounter (Signed)
Patient made aware of Dr Hassell Done recommendations. Advised the patient that Laurann Montana, NP  will go over this with him at his appointment on Monday. He voiced understanding.

## 2021-04-20 ENCOUNTER — Ambulatory Visit (HOSPITAL_BASED_OUTPATIENT_CLINIC_OR_DEPARTMENT_OTHER): Payer: Medicare HMO | Admitting: Family

## 2021-04-20 ENCOUNTER — Telehealth (HOSPITAL_BASED_OUTPATIENT_CLINIC_OR_DEPARTMENT_OTHER): Payer: Self-pay

## 2021-04-20 ENCOUNTER — Encounter (HOSPITAL_BASED_OUTPATIENT_CLINIC_OR_DEPARTMENT_OTHER): Payer: Self-pay | Admitting: Family

## 2021-04-20 ENCOUNTER — Other Ambulatory Visit: Payer: Self-pay

## 2021-04-20 ENCOUNTER — Telehealth (HOSPITAL_BASED_OUTPATIENT_CLINIC_OR_DEPARTMENT_OTHER): Payer: Self-pay | Admitting: Family

## 2021-04-20 VITALS — BP 140/82 | HR 57 | Ht 75.0 in | Wt 181.7 lb

## 2021-04-20 DIAGNOSIS — I10 Essential (primary) hypertension: Secondary | ICD-10-CM | POA: Diagnosis not present

## 2021-04-20 DIAGNOSIS — N186 End stage renal disease: Secondary | ICD-10-CM

## 2021-04-20 DIAGNOSIS — D6859 Other primary thrombophilia: Secondary | ICD-10-CM

## 2021-04-20 DIAGNOSIS — I480 Paroxysmal atrial fibrillation: Secondary | ICD-10-CM

## 2021-04-20 DIAGNOSIS — E785 Hyperlipidemia, unspecified: Secondary | ICD-10-CM | POA: Diagnosis not present

## 2021-04-20 DIAGNOSIS — Z01810 Encounter for preprocedural cardiovascular examination: Secondary | ICD-10-CM

## 2021-04-20 DIAGNOSIS — I25118 Atherosclerotic heart disease of native coronary artery with other forms of angina pectoris: Secondary | ICD-10-CM | POA: Diagnosis not present

## 2021-04-20 MED ORDER — ATORVASTATIN CALCIUM 80 MG PO TABS: 80.0000 mg | ORAL_TABLET | Freq: Every day | ORAL | 3 refills | Status: DC

## 2021-04-20 NOTE — Telephone Encounter (Signed)
Left message for patient to call and discuss scheduling the lexiscan myoview ordered by Dr. Irish Lack

## 2021-04-20 NOTE — Telephone Encounter (Addendum)
Attempted to call patient, no answer [id number], will also send my chart message!!   ----- Message from Loel Dubonnet, NP sent at 04/20/2021  1:53 PM EST ----- [name] - Atorvastatin off med list for unknown reason. I sent refill. Can we please call him to let him know to resume?   [name] - he did not stop by check out and needs to schedule Lexiscan myoview. Previously ordered by Dr. Irish Lack. Please schedule for Monday, Wednesday, or Friday due to HD schedule.

## 2021-04-20 NOTE — Patient Instructions (Signed)
Medication Instructions:  Continue your current medications.   Continue to hold Carvedilol (Coreg) prior to dialysis.  On evenings after dialysis, if BP is not more than 110/60 do not take evening dose of Carvedilol (Coreg).   *If you need a refill on your cardiac medications before your next appointment, please call your pharmacy*   Lab Work: None ordered today.   Testing/Procedures: Your physician has requested that you have a lexiscan myoview. Please follow instruction sheet, as given.   Follow-Up: At [facility], you and your health needs are our priority.  As part of our continuing mission to provide you with exceptional heart care, we have created designated Provider Care Teams.  These Care Teams include your primary Cardiologist (physician) and Advanced Practice Providers (APPs -  Physician Assistants and Nurse Practitioners) who all work together to provide you with the care you need, when you need it.  We recommend signing up for the patient portal called "MyChart".  Sign up information is provided on this After Visit Summary.  MyChart is used to connect with patients for Virtual Visits (Telemedicine).  Patients are able to view lab/test results, encounter notes, upcoming appointments, etc.  Non-urgent messages can be sent to your provider as well.   To learn more about what you can do with MyChart, go to [email or web address].    Your next appointment:   6 month(s)  The format for your next appointment:   In Person  Provider:   Larae Grooms, MD    Other Instructions  We will send clearance for your surgery so long as stress test returns low risk.

## 2021-04-20 NOTE — Progress Notes (Signed)
Office Visit    Patient Name: [name]. Date of Encounter: 04/20/2021  PCP:  [name], MD   [place]  Cardiologist:  Larae Grooms, MD  Advanced Practice Provider:  No care team member to display Electrophysiologist:  None    Chief Complaint    [name] Hamad Whyte. is a 51 y.o. male with a hx of diabetes, ESRD on HD, hypertension, hyperlipidemia, gastroparesis, diastolic dysfunction, coronary artery disease presents today for preoperative cardiovascular clearance  Past Medical History    Past Medical History:  Diagnosis Date   Anxiety    Cellulitis    CHF (congestive heart failure) (HCC)    Chronic diarrhea    Chronic kidney disease    Chronic pain of both ankles    Diabetes mellitus without complication ([place])    Diabetes mellitus, type II ([place])    Diabetic neuropathy ([place])    Frequent falls    Gait instability    GERD (gastroesophageal reflux disease)    Heart palpitations    Hemodialysis patient ([place])    History of kidney stones    HLD (hyperlipidemia)    Hypertension    Insomnia    Nausea and vomiting in adult    recurrent   Past Surgical History:  Procedure Laterality Date   AMPUTATION Left 03/16/2016   Procedure: AMPUTATION DIGIT LEFT HALLUX;  Surgeon: Trula Slade, DPM;  Location: [place];  Service: Podiatry;  Laterality: Left;  can start around 5    AMPUTATION Right 02/09/2021   Procedure: AMPUTATION DIGIT;  Surgeon: Sherilyn Cooter, MD;  Location: [place];  Service: Orthopedics;  Laterality: Right;   ARTHROSCOPIC REPAIR ACL Left    AV FISTULA PLACEMENT Right 05/31/2019   Procedure: Brachiocephalic AV fistula creation;  Surgeon: Algernon Huxley, MD;  Location: [name];  Service: Vascular;  Laterality: Right;   COLONOSCOPY WITH PROPOFOL N/A 10/28/2015   Procedure: COLONOSCOPY WITH PROPOFOL;  Surgeon: Lollie Sails, MD;  Location: [facility] ENDOSCOPY;  Service: Endoscopy;  Laterality: N/A;   COLONOSCOPY WITH  PROPOFOL N/A 10/29/2015   Procedure: COLONOSCOPY WITH PROPOFOL;  Surgeon: Lollie Sails, MD;  Location: [facility] ENDOSCOPY;  Service: Endoscopy;  Laterality: N/A;   CORONARY/GRAFT ACUTE MI REVASCULARIZATION N/A 02/26/2020   Procedure: Coronary/Graft Acute MI Revascularization;  Surgeon: Jettie Booze, MD;  Location: [place] [facility];  Service: Cardiovascular;  Laterality: N/A;   DIALYSIS/PERMA CATHETER INSERTION Right 04/26/2019   Perm Cath    DIALYSIS/PERMA CATHETER INSERTION N/A 04/26/2019   Procedure: DIALYSIS/PERMA CATHETER INSERTION;  Surgeon: Algernon Huxley, MD;  Location: [place] CV LAB;  Service: Cardiovascular;  Laterality: N/A;   DIALYSIS/PERMA CATHETER REMOVAL N/A 08/15/2019   Procedure: DIALYSIS/PERMA CATHETER REMOVAL;  Surgeon: Katha Cabal, MD;  Location: [place] [facility];  Service: Cardiovascular;  Laterality: N/A;   EMBOLIZATION Right 02/06/2021   Procedure: EMBOLIZATION;  Surgeon: Algernon Huxley, MD;  Location: [place] [facility];  Service: Cardiovascular;  Laterality: Right;  Right Upper Extremity Dialysis Access, Permcath Placement   ESOPHAGOGASTRODUODENOSCOPY (EGD) WITH PROPOFOL N/A 12/27/2017   Procedure: ESOPHAGOGASTRODUODENOSCOPY (EGD) WITH PROPOFOL;  Surgeon: [place], Benay Pike, MD;  Location: [name] ENDOSCOPY;  Service: Gastroenterology;  Laterality: N/A;   INTRAVASCULAR ULTRASOUND/IVUS N/A 02/26/2020   Procedure: Intravascular Ultrasound/IVUS;  Surgeon: Jettie Booze, MD;  Location: [place] [facility];  Service: Cardiovascular;  Laterality: N/A;   LEFT HEART CATH AND CORONARY ANGIOGRAPHY N/A 02/26/2020   Procedure: LEFT HEART CATH AND CORONARY ANGIOGRAPHY;  Surgeon: Jettie Booze, MD;  Location: [place] [facility];  Service: Cardiovascular;  Laterality: N/A;   PROSTATE SURGERY  2016   TONSILECTOMY/ADENOIDECTOMY WITH MYRINGOTOMY     TONSILLECTOMY     UPPER EXTREMITY ANGIOGRAPHY Right 11/26/2020   Procedure: Upper Extremity Angiography;  Surgeon:  Algernon Huxley, MD;  Location: [place] [facility];  Service: Cardiovascular;  Laterality: Right;   UPPER EXTREMITY ANGIOGRAPHY Right 02/05/2021   Procedure: UPPER EXTREMITY ANGIOGRAPHY;  Surgeon: Algernon Huxley, MD;  Location: [place] [facility];  Service: Cardiovascular;  Laterality: Right;    Allergies  Allergies  Allergen Reactions   Levofloxacin Swelling    Of face Other reaction(s): swelling   Promethazine Diarrhea and Other (See Comments)    Other reaction(s): Other (See Comments), Other (See Comments) Other reaction(s): Muscle cramps Muscle cramps Cramping Other reaction(s): Muscle cramps Muscle cramps   Other     Other reaction(s): Unknown   Malt Other (See Comments)    Other reaction(s): cramping    History of Present Illness    [name] Raheel Kunkle. is a 51 y.o. male with a hx of diabetes, ESRD on HD, hypertension, hyperlipidemia, gastroparesis, diastolic dysfunction, coronary artery disease last seen 05/19/2020 by Dr. Emeterio Reeve.  Prior anterior STEMI treated with DES to LAD January 2022 due to 90% stenosis. Minimal nonobstructive disease in other vessels.  He did have atrial fibrillation at the time and was treated with Amiodarone, [place].  Echo 11/2020 normal LVEF 55 to 60%, no R WMA, grade 2 diastolic dysfunction, LA mild to moderately dilated, RA mildly dilated, small to moderate pericardial effusion without tamponade, trivial MR, borderline dilation aortic root 38 mm, mildly dilated pulmonary artery.  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  Phone call to the 04/06/21 was recommended to skip carvedilol prior to dialysis due to hypotension.  He was a no-show for Myoview scheduled for 04/08/21. This was ordered per patient request due to possible kidney transplant.  He presents today for preoperative clearance for total parathyroidectomy with reimplantation and gland exploration.  Request to hold   Plavix, Eliquis.  Per pharmacy review and Dr. Irish Lack he is high cardiovascular risk off anticoagulation due to previous CVA.  Lovenox poor choice as he is on dialysis.  Since he is high risk recommended he be admitted a few days early and started on heparin drip.  Per Dr. Irish Lack may hold Plavix 5 days prior to procedure.  Tells me he continues to struggle with hypotension in the setting of dialysis.  Encouraged to discuss with nephrologist.  He is not taking carvedilol prior to dialysis.  Educated to hold evening dose of carvedilol if SBP <110. Checks BP at home with wrist cuff but did not bring log. Denies chest pain. Reports exertional dyspnea with more than usual activities. Residual stroke defect of affected speech.   EKGs/Labs/Other Studies Reviewed:   The following studies were reviewed today:  LHC 02/2020 The left ventricular systolic function is normal. LV end diastolic pressure is normal. The left ventricular ejection fraction is greater than 65% by visual estimate. There is no aortic valve stenosis. Mid LAD lesion is 90% stenosed. A drug-eluting stent was successfully placed using a STENT RESOLUTE ONYX 3.5X30, postdilated to > 4 mm. Stent optimized with IVUS. Post intervention, there is a 0% residual stenosis.   I suspect his abnormal ECG and anterior injury pattern was more from demand ischemia in the  setting of hypotension and AFib with RVR, with severe mid LAD lesion.     During the cath, he converted to NSR briefly and his pulse pressure and BP returned to normal without any pressors. In addition, LVEDP only 11 mm Hg despite having 2.4 L NS administered on transport.  He may have been a little dehydrated after 3 L fluid removal after dialysis.    In regards to AFib.  Continue IV Amiodarone.  Will have to decide if he needs longterm anticoagulation along with antiplatelet therapy.  If so, would change Brilinta to clopidogrel.  Would have to check what options work in the setting  of ESRD.   Echo 11/2020  1. Left ventricular ejection fraction, by estimation, is 55 to 60%. The  left ventricle has normal function. The left ventricle has no regional  wall motion abnormalities. The left ventricular internal cavity size was  borderline dilated. There is moderate   left ventricular hypertrophy. Left ventricular diastolic parameters are  consistent with Grade II diastolic dysfunction (pseudonormalization).  Elevated left atrial pressure. The average left ventricular global  longitudinal strain is -13.0 %. The global  longitudinal strain is abnormal.   2. Right ventricular systolic function is normal. The right ventricular  size is mildly enlarged. Tricuspid regurgitation signal is inadequate for  assessing PA pressure.   3. Left atrial size was mild to moderately dilated.   4. Right atrial size was mildly dilated.   5. There is a small to moderate pericardial effusion. There is no  evidence of cardiac tamponade.   6. The mitral valve is normal in structure. Trivial mitral valve  regurgitation. No evidence of mitral stenosis.   7. The aortic valve is tricuspid. Aortic valve regurgitation is not  visualized. No aortic stenosis is present.   8. Aortic dilatation noted. There is borderline dilatation of the aortic  root, measuring 38 mm.   9. Mildly dilated pulmonary artery.  10. The inferior vena cava is dilated in size with >50% respiratory  variability, suggesting right atrial pressure of 8 mmHg.   EKG:  EKG is ordered today.  The ekg ordered today demonstrates sinus bradycardia 57 BPH with voltage criteria for LVH and stable T wave inversion in lateral leads.  Inferior T wave inversion increased compared to prior.  Recent Labs: 12/08/2020: B Natriuretic Peptide 482.0; TSH 6.354 02/07/2021: Magnesium 1.9 02/13/2021: ALT 6; BUN 48; Creatinine, Ser 11.42; Hemoglobin 7.5; Platelets 200; Potassium 3.9; Sodium 144  Recent Lipid Panel    Component Value Date/Time    CHOL 84 02/13/2021 0124   TRIG 68 02/13/2021 0124   HDL 32 (L) 02/13/2021 0124   CHOLHDL 2.6 02/13/2021 0124   VLDL 14 02/13/2021 0124   LDLCALC 38 02/13/2021 0124   LDLCALC 102 (H) 04/25/2019 1159    Risk Assessment/Calculations:   CHA2DS2-VASc Score = 6   This indicates a 9.7% annual risk of stroke. The patient's score is based upon: CHF History: 1 HTN History: 1 Diabetes History: 1 Stroke History: 2 Vascular Disease History: 1 Age Score: 0 Gender Score: 0    Home Medications   Current Meds  Medication Sig   amiodarone (PACERONE) 200 MG tablet Take 1 tablet (200 mg total) by mouth daily   apixaban (ELIQUIS) 5 MG TABS tablet Take 1 tablet (5 mg total) by mouth 2 (two) times daily.   artificial tears (LACRILUBE) OINT ophthalmic ointment Place into the left eye 4 (four) times daily.   carvedilol (COREG) 25 MG tablet Take 1  tablet (25 mg total) by mouth 2 (two) times daily with a meal.   clopidogrel (PLAVIX) 75 MG tablet Take 1 tablet (75 mg total) by mouth daily.   dorzolamide-timolol (COSOPT) 22.3-6.8 MG/ML ophthalmic solution Place 1 drop into the right eye 2 (two) times daily.   nitroGLYCERIN (NITROSTAT) 0.4 MG SL tablet Place 1 tablet (0.4 mg total) under the tongue every 5 (five) minutes as needed for chest pain.   prednisoLONE acetate (PRED FORTE) 1 % ophthalmic suspension Place 1 drop into the left eye every 4 (four) hours.   tamsulosin (FLOMAX) 0.4 MG CAPS capsule Take 0.4 mg by mouth daily after breakfast.      Review of Systems      All other systems reviewed and are otherwise negative except as noted above.  Physical Exam    VS:  BP 140/82 (BP Location: Left Arm, Patient Position: Sitting, Cuff Size: Normal)    Pulse (!) 57    Ht 6\' 3"  (1.905 m)    Wt 181 lb 11.2 oz (82.4 kg)    SpO2 98%    BMI 22.71 kg/m  , BMI Body mass index is 22.71 kg/m.  Wt Readings from Last 3 Encounters:  04/20/21 181 lb 11.2 oz (82.4 kg)  03/18/21 190 lb (86.2 kg)  02/13/21 203 lb  7.8 oz (92.3 kg)     GEN: Well nourished, well developed, in no acute distress. HEENT: normal. Neck: Supple, no JVD, carotid bruits, or masses. Cardiac: RRR, no murmurs, rubs, or gallops. No clubbing, cyanosis, edema.  Radials/PT 2+ and equal bilaterally.  Respiratory:  Respirations regular and unlabored, clear to auscultation bilaterally. GI: Soft, nontender, nondistended. MS: No deformity or atrophy. Skin: Warm and dry, no rash. Right ring finger has been amputated. Neuro:  Strength and sensation are intact. Psych: Normal affect.  Assessment & Plan    Preop clearance - Upcoming total parathyroidectomy with reimplantation and gland exploration as well as pending renal transplant.  EKG today shows new inferior changes.  He denies chest pain, pressure, tightness.According to the Revised Cardiac Risk Index (RCRI), his Perioperative Risk of Major Cardiac Event is (%): 11. His  Functional Capacity in METs is: 5.62 according to the [facility] Activity Status Index (DASI). Given new EKG changes, plan for [facility]. Previously ordered but not yet completed. Cardiac clearance pending myoview results. Will need to be admitted prior to surgery for IV heparin due to hx of CVA off [place] and ESRD. May hold Plavix per Dr. Irish Lack.  Shared Decision Making/Informed Consent The risks [chest pain, shortness of breath, cardiac arrhythmias, dizziness, blood pressure fluctuations, myocardial infarction, stroke/transient ischemic attack, nausea, vomiting, allergic reaction, radiation exposure, metallic taste sensation and life-threatening complications (estimated to be 1 in 10,000)], benefits (risk stratification, diagnosing coronary artery disease, treatment guidance) and alternatives of a nuclear stress test were discussed in detail with [name] and he agrees to proceed.   CAD -prior MI with DES to LAD 02/2020.  EKG today shows new progressive T wave version to inferiorly.  Stable lateral T wave inversion.   Given upcoming surgical procedures plan for [facility].  GDMT includes Plavix, atorvastatin, carvedilol. Heart healthy diet and regular cardiovascular exercise encouraged.    ESRD - HD T/Th/S.  Continue to follow nephrology.  Plans for upcoming kidney transplant at [facility] in [place].  Date not yet set.  Plan for [facility] for preoperative clearance, as detailed above.  PAF - maintaining NSR today. Denies palpitations. Continue Eliquis 5mg  BID. CHA2DS2-VASc  Score = 6 [CHF History: 1, HTN History: 1, Diabetes History: 1, Stroke History: 2, Vascular Disease History: 1, Age Score: 0, Gender Score: 0].  Therefore, the patient's annual risk of stroke is 9.7 %.      HTN - BP well controlled. Continue current antihypertensive regimen of carvedilol 25 mg twice daily.  Despite holding morning dose of carvedilol prior to dialysis he is still having difficulties with hypotension.  Encouraged to discuss with nephrology.  Educated to hold carvedilol if SBP less than 110.  HLD - Not taking Atorvastatin for unclear reason. Recommend he resume Atorvastatin 80mg  QD due to CAD with LDL goal <70. Refill provided.  Secondary hyperparathyroidism due to ESRD - Follows with ENT. Preop clearance, as above.  Disposition: Follow up in 6 month(s) with Larae Grooms, MD or APP.  Signed, Loel Dubonnet, NP 04/20/2021, 1:44 PM [facility]

## 2021-04-21 ENCOUNTER — Observation Stay (HOSPITAL_COMMUNITY): Payer: Medicare HMO

## 2021-04-21 ENCOUNTER — Other Ambulatory Visit (HOSPITAL_COMMUNITY): Payer: Self-pay | Admitting: *Deleted

## 2021-04-21 ENCOUNTER — Emergency Department (HOSPITAL_COMMUNITY): Payer: Medicare HMO

## 2021-04-21 ENCOUNTER — Encounter (HOSPITAL_COMMUNITY): Payer: Self-pay

## 2021-04-21 ENCOUNTER — Other Ambulatory Visit: Payer: Self-pay

## 2021-04-21 ENCOUNTER — Other Ambulatory Visit (HOSPITAL_COMMUNITY): Payer: Medicare HMO

## 2021-04-21 ENCOUNTER — Inpatient Hospital Stay (HOSPITAL_COMMUNITY)
Admission: EM | Admit: 2021-04-21 | Discharge: 2021-04-24 | DRG: 064 | Disposition: A | Discharge status: Rehabilitation Facility | Payer: Medicare HMO | Attending: Family Medicine | Admitting: Family Medicine

## 2021-04-21 DIAGNOSIS — Z833 Family history of diabetes mellitus: Secondary | ICD-10-CM

## 2021-04-21 DIAGNOSIS — R531 Weakness: Secondary | ICD-10-CM

## 2021-04-21 DIAGNOSIS — Z8673 Personal history of transient ischemic attack (TIA), and cerebral infarction without residual deficits: Secondary | ICD-10-CM

## 2021-04-21 DIAGNOSIS — I3139 Other pericardial effusion (noninflammatory): Secondary | ICD-10-CM | POA: Diagnosis present

## 2021-04-21 DIAGNOSIS — E1122 Type 2 diabetes mellitus with diabetic chronic kidney disease: Secondary | ICD-10-CM | POA: Diagnosis present

## 2021-04-21 DIAGNOSIS — Z20822 Contact with and (suspected) exposure to covid-19: Secondary | ICD-10-CM | POA: Diagnosis present

## 2021-04-21 DIAGNOSIS — K219 Gastro-esophageal reflux disease without esophagitis: Secondary | ICD-10-CM | POA: Diagnosis present

## 2021-04-21 DIAGNOSIS — Z89021 Acquired absence of right finger(s): Secondary | ICD-10-CM

## 2021-04-21 DIAGNOSIS — F339 Major depressive disorder, recurrent, unspecified: Secondary | ICD-10-CM | POA: Diagnosis present

## 2021-04-21 DIAGNOSIS — H5440 Blindness, one eye, unspecified eye: Secondary | ICD-10-CM | POA: Diagnosis present

## 2021-04-21 DIAGNOSIS — I252 Old myocardial infarction: Secondary | ICD-10-CM

## 2021-04-21 DIAGNOSIS — I10 Essential (primary) hypertension: Secondary | ICD-10-CM | POA: Diagnosis present

## 2021-04-21 DIAGNOSIS — I959 Hypotension, unspecified: Secondary | ICD-10-CM | POA: Diagnosis present

## 2021-04-21 DIAGNOSIS — Z992 Dependence on renal dialysis: Secondary | ICD-10-CM

## 2021-04-21 DIAGNOSIS — D631 Anemia in chronic kidney disease: Secondary | ICD-10-CM | POA: Diagnosis present

## 2021-04-21 DIAGNOSIS — H5462 Unqualified visual loss, left eye, normal vision right eye: Secondary | ICD-10-CM | POA: Diagnosis present

## 2021-04-21 DIAGNOSIS — K659 Peritonitis, unspecified: Secondary | ICD-10-CM | POA: Diagnosis present

## 2021-04-21 DIAGNOSIS — Z7901 Long term (current) use of anticoagulants: Secondary | ICD-10-CM

## 2021-04-21 DIAGNOSIS — R2990 Unspecified symptoms and signs involving the nervous system: Secondary | ICD-10-CM

## 2021-04-21 DIAGNOSIS — I160 Hypertensive urgency: Secondary | ICD-10-CM | POA: Insufficient documentation

## 2021-04-21 DIAGNOSIS — D509 Iron deficiency anemia, unspecified: Secondary | ICD-10-CM | POA: Diagnosis present

## 2021-04-21 DIAGNOSIS — N2581 Secondary hyperparathyroidism of renal origin: Secondary | ICD-10-CM | POA: Diagnosis present

## 2021-04-21 DIAGNOSIS — I639 Cerebral infarction, unspecified: Secondary | ICD-10-CM

## 2021-04-21 DIAGNOSIS — E538 Deficiency of other specified B group vitamins: Secondary | ICD-10-CM | POA: Diagnosis present

## 2021-04-21 DIAGNOSIS — Z888 Allergy status to other drugs, medicaments and biological substances status: Secondary | ICD-10-CM

## 2021-04-21 DIAGNOSIS — E785 Hyperlipidemia, unspecified: Secondary | ICD-10-CM | POA: Diagnosis present

## 2021-04-21 DIAGNOSIS — Z8249 Family history of ischemic heart disease and other diseases of the circulatory system: Secondary | ICD-10-CM

## 2021-04-21 DIAGNOSIS — N186 End stage renal disease: Secondary | ICD-10-CM | POA: Diagnosis present

## 2021-04-21 DIAGNOSIS — I482 Chronic atrial fibrillation, unspecified: Secondary | ICD-10-CM

## 2021-04-21 DIAGNOSIS — Z823 Family history of stroke: Secondary | ICD-10-CM

## 2021-04-21 DIAGNOSIS — R34 Anuria and oliguria: Secondary | ICD-10-CM | POA: Diagnosis present

## 2021-04-21 DIAGNOSIS — Z955 Presence of coronary angioplasty implant and graft: Secondary | ICD-10-CM

## 2021-04-21 DIAGNOSIS — I5032 Chronic diastolic (congestive) heart failure: Secondary | ICD-10-CM | POA: Diagnosis present

## 2021-04-21 DIAGNOSIS — R253 Fasciculation: Secondary | ICD-10-CM | POA: Diagnosis present

## 2021-04-21 DIAGNOSIS — I169 Hypertensive crisis, unspecified: Secondary | ICD-10-CM | POA: Insufficient documentation

## 2021-04-21 DIAGNOSIS — G8194 Hemiplegia, unspecified affecting left nondominant side: Secondary | ICD-10-CM | POA: Diagnosis present

## 2021-04-21 DIAGNOSIS — Z79899 Other long term (current) drug therapy: Secondary | ICD-10-CM

## 2021-04-21 DIAGNOSIS — I2510 Atherosclerotic heart disease of native coronary artery without angina pectoris: Secondary | ICD-10-CM | POA: Diagnosis present

## 2021-04-21 DIAGNOSIS — I4891 Unspecified atrial fibrillation: Secondary | ICD-10-CM | POA: Diagnosis present

## 2021-04-21 DIAGNOSIS — Z89411 Acquired absence of right great toe: Secondary | ICD-10-CM

## 2021-04-21 DIAGNOSIS — I480 Paroxysmal atrial fibrillation: Secondary | ICD-10-CM | POA: Diagnosis present

## 2021-04-21 DIAGNOSIS — Z818 Family history of other mental and behavioral disorders: Secondary | ICD-10-CM

## 2021-04-21 DIAGNOSIS — I6389 Other cerebral infarction: Secondary | ICD-10-CM

## 2021-04-21 DIAGNOSIS — I132 Hypertensive heart and chronic kidney disease with heart failure and with stage 5 chronic kidney disease, or end stage renal disease: Secondary | ICD-10-CM | POA: Diagnosis present

## 2021-04-21 DIAGNOSIS — Z841 Family history of disorders of kidney and ureter: Secondary | ICD-10-CM

## 2021-04-21 DIAGNOSIS — R29703 NIHSS score 3: Secondary | ICD-10-CM | POA: Diagnosis present

## 2021-04-21 HISTORY — DX: Other cerebral infarction: I63.89

## 2021-04-21 HISTORY — DX: Cerebral infarction, unspecified: I63.9

## 2021-04-21 HISTORY — DX: Esophagitis, unspecified without bleeding: K20.90

## 2021-04-21 HISTORY — DX: Acquired absence of eye: Z90.01

## 2021-04-21 HISTORY — DX: Gastritis, unspecified, without bleeding: K29.70

## 2021-04-21 LAB — CBC
HCT: 37.5 % — ABNORMAL LOW (ref 39.0–52.0)
Hemoglobin: 11.4 g/dL — ABNORMAL LOW (ref 13.0–17.0)
MCH: 24.1 pg — ABNORMAL LOW (ref 26.0–34.0)
MCHC: 30.4 g/dL (ref 30.0–36.0)
MCV: 79.1 fL — ABNORMAL LOW (ref 80.0–100.0)
Platelets: 156 10*3/uL (ref 150–400)
RBC: 4.74 MIL/uL (ref 4.22–5.81)
RDW: 20.4 % — ABNORMAL HIGH (ref 11.5–15.5)
WBC: 6 10*3/uL (ref 4.0–10.5)
nRBC: 0 % (ref 0.0–0.2)

## 2021-04-21 LAB — COMPREHENSIVE METABOLIC PANEL
ALT: 11 U/L (ref 0–44)
AST: 14 U/L — ABNORMAL LOW (ref 15–41)
Albumin: 3.3 g/dL — ABNORMAL LOW (ref 3.5–5.0)
Alkaline Phosphatase: 80 U/L (ref 38–126)
Anion gap: 12 (ref 5–15)
BUN: 28 mg/dL — ABNORMAL HIGH (ref 6–20)
CO2: 23 mmol/L (ref 22–32)
Calcium: 8.5 mg/dL — ABNORMAL LOW (ref 8.9–10.3)
Chloride: 103 mmol/L (ref 98–111)
Creatinine, Ser: 6.63 mg/dL — ABNORMAL HIGH (ref 0.61–1.24)
GFR, Estimated: 9 mL/min — ABNORMAL LOW (ref >60)
Glucose, Bld: 91 mg/dL (ref 70–99)
Potassium: 3.7 mmol/L (ref 3.5–5.1)
Sodium: 138 mmol/L (ref 135–145)
Total Bilirubin: 0.7 mg/dL (ref 0.3–1.2)
Total Protein: 6.9 g/dL (ref 6.5–8.1)

## 2021-04-21 LAB — HEMOGLOBIN A1C
Hgb A1c MFr Bld: 4.7 % — ABNORMAL LOW (ref 4.8–5.6)
Mean Plasma Glucose: 88.19 mg/dL

## 2021-04-21 LAB — ETHANOL: Alcohol, Ethyl (B): <10 mg/dL (ref <10)

## 2021-04-21 LAB — APTT: aPTT: 33 seconds (ref 24–36)

## 2021-04-21 LAB — DIFFERENTIAL
Abs Immature Granulocytes: 0.02 10*3/uL (ref 0.00–0.07)
Basophils Absolute: 0 10*3/uL (ref 0.0–0.1)
Basophils Relative: 0 %
Eosinophils Absolute: 0.1 10*3/uL (ref 0.0–0.5)
Eosinophils Relative: 2 %
Immature Granulocytes: 0 %
Lymphocytes Relative: 22 %
Lymphs Abs: 1.3 10*3/uL (ref 0.7–4.0)
Monocytes Absolute: 0.7 10*3/uL (ref 0.1–1.0)
Monocytes Relative: 12 %
Neutro Abs: 3.8 10*3/uL (ref 1.7–7.7)
Neutrophils Relative %: 64 %

## 2021-04-21 LAB — PROTIME-INR
INR: 1.1 (ref 0.8–1.2)
Prothrombin Time: 14.1 seconds (ref 11.4–15.2)

## 2021-04-21 LAB — RESP PANEL BY RT-PCR (FLU A&B, COVID) ARPGX2
Influenza A by PCR: NEGATIVE
Influenza B by PCR: NEGATIVE
SARS Coronavirus 2 by RT PCR: NEGATIVE

## 2021-04-21 LAB — GLUCOSE, CAPILLARY: Glucose-Capillary: 107 mg/dL — ABNORMAL HIGH (ref 70–99)

## 2021-04-21 MED ORDER — SODIUM CHLORIDE 0.9% FLUSH
3.0000 mL | Freq: Two times a day (BID) | INTRAVENOUS | Status: DC
  Administered 2021-04-23 – 2021-04-24 (×3): 3 mL via INTRAVENOUS

## 2021-04-21 MED ORDER — METOPROLOL TARTRATE 5 MG/5ML IV SOLN
5.0000 mg | Freq: Once | INTRAVENOUS | Status: AC
  Administered 2021-04-21: 5 mg via INTRAVENOUS
  Filled 2021-04-21: qty 5

## 2021-04-21 MED ORDER — ACETAMINOPHEN 325 MG PO TABS
650.0000 mg | ORAL_TABLET | Freq: Four times a day (QID) | ORAL | Status: DC | PRN
  Administered 2021-04-22 (×2): 650 mg via ORAL
  Filled 2021-04-21 (×2): qty 2

## 2021-04-21 MED ORDER — DORZOLAMIDE HCL-TIMOLOL MAL 2-0.5 % OP SOLN
1.0000 [drp] | Freq: Two times a day (BID) | OPHTHALMIC | Status: DC
  Administered 2021-04-21 – 2021-04-24 (×7): 1 [drp] via OPHTHALMIC
  Filled 2021-04-21: qty 10

## 2021-04-21 MED ORDER — INSULIN ASPART 100 UNIT/ML IJ SOLN: 0.0000 [IU] | Freq: Three times a day (TID) | INTRAMUSCULAR | Status: DC

## 2021-04-21 MED ORDER — PREDNISOLONE ACETATE 1 % OP SUSP
1.0000 [drp] | OPHTHALMIC | Status: DC
  Administered 2021-04-21 – 2021-04-24 (×17): 1 [drp] via OPHTHALMIC
  Filled 2021-04-21: qty 1

## 2021-04-21 MED ORDER — ADULT MULTIVITAMIN W/MINERALS CH
1.0000 | ORAL_TABLET | Freq: Every day | ORAL | Status: DC
  Administered 2021-04-21 – 2021-04-24 (×4): 1 via ORAL
  Filled 2021-04-21 (×4): qty 1

## 2021-04-21 MED ORDER — SODIUM CHLORIDE 0.9% FLUSH: 3.0000 mL | INTRAVENOUS | Status: DC | PRN

## 2021-04-21 MED ORDER — ASPIRIN EC 81 MG PO TBEC
81.0000 mg | DELAYED_RELEASE_TABLET | Freq: Every day | ORAL | Status: DC
  Administered 2021-04-22 – 2021-04-24 (×3): 81 mg via ORAL
  Filled 2021-04-21 (×3): qty 1

## 2021-04-21 MED ORDER — BISACODYL 10 MG RE SUPP: 10.0000 mg | Freq: Every day | RECTAL | Status: DC | PRN

## 2021-04-21 MED ORDER — SODIUM CHLORIDE 0.9% FLUSH
3.0000 mL | Freq: Two times a day (BID) | INTRAVENOUS | Status: DC
  Administered 2021-04-21 – 2021-04-23 (×6): 3 mL via INTRAVENOUS

## 2021-04-21 MED ORDER — AMIODARONE HCL 200 MG PO TABS: 200.0000 mg | ORAL_TABLET | Freq: Every day | ORAL | Status: DC

## 2021-04-21 MED ORDER — ASPIRIN EC 81 MG PO TBEC: 81.0000 mg | DELAYED_RELEASE_TABLET | Freq: Every day | ORAL | Status: DC

## 2021-04-21 MED ORDER — POLYETHYLENE GLYCOL 3350 17 G PO PACK: 17.0000 g | PACK | Freq: Every day | ORAL | Status: DC | PRN

## 2021-04-21 MED ORDER — INSULIN ASPART 100 UNIT/ML IJ SOLN: 0.0000 [IU] | Freq: Every day | INTRAMUSCULAR | Status: DC

## 2021-04-21 MED ORDER — METOPROLOL TARTRATE 25 MG PO TABS
25.0000 mg | ORAL_TABLET | Freq: Three times a day (TID) | ORAL | Status: DC
  Administered 2021-04-21 – 2021-04-24 (×6): 25 mg via ORAL
  Filled 2021-04-21 (×7): qty 1

## 2021-04-21 MED ORDER — LORAZEPAM 2 MG/ML IJ SOLN
0.5000 mg | Freq: Four times a day (QID) | INTRAMUSCULAR | Status: DC | PRN
Start: 2021-04-21 — End: 2021-04-24
  Administered 2021-04-21 – 2021-04-23 (×3): 0.5 mg via INTRAVENOUS
  Filled 2021-04-21 (×3): qty 1

## 2021-04-21 MED ORDER — ACETAMINOPHEN 650 MG RE SUPP: 650.0000 mg | Freq: Four times a day (QID) | RECTAL | Status: DC | PRN

## 2021-04-21 MED ORDER — IOHEXOL 350 MG/ML SOLN
75.0000 mL | Freq: Once | INTRAVENOUS | Status: AC | PRN
  Administered 2021-04-21: 75 mL via INTRAVENOUS

## 2021-04-21 MED ORDER — ARTIFICIAL TEARS OPHTHALMIC OINT
TOPICAL_OINTMENT | Freq: Four times a day (QID) | OPHTHALMIC | Status: DC
  Administered 2021-04-22: 1 via OPHTHALMIC
  Filled 2021-04-21: qty 3.5

## 2021-04-21 MED ORDER — HEPARIN SODIUM (PORCINE) 5000 UNIT/ML IJ SOLN
5000.0000 [IU] | Freq: Three times a day (TID) | INTRAMUSCULAR | Status: DC
  Administered 2021-04-21 – 2021-04-24 (×8): 5000 [IU] via SUBCUTANEOUS
  Filled 2021-04-21 (×9): qty 1

## 2021-04-21 MED ORDER — SODIUM CHLORIDE 0.9 % IV SOLN: 250.0000 mL | INTRAVENOUS | Status: DC | PRN

## 2021-04-21 MED ORDER — METOPROLOL TARTRATE 25 MG PO TABS
25.0000 mg | ORAL_TABLET | Freq: Four times a day (QID) | ORAL | Status: DC
  Administered 2021-04-21: 25 mg via ORAL
  Filled 2021-04-21: qty 1

## 2021-04-21 MED ORDER — AMIODARONE HCL 200 MG PO TABS
200.0000 mg | ORAL_TABLET | Freq: Every day | ORAL | Status: DC
Start: 2021-04-21 — End: 2021-04-24
  Administered 2021-04-21 – 2021-04-24 (×4): 200 mg via ORAL
  Filled 2021-04-21 (×4): qty 1

## 2021-04-21 MED ORDER — ATORVASTATIN CALCIUM 40 MG PO TABS
80.0000 mg | ORAL_TABLET | Freq: Every day | ORAL | Status: DC
  Administered 2021-04-21 – 2021-04-24 (×4): 80 mg via ORAL
  Filled 2021-04-21 (×4): qty 2

## 2021-04-21 NOTE — Progress Notes (Addendum)
Charted on different patient.

## 2021-04-21 NOTE — ED Triage Notes (Signed)
Patient via EMS from dialysis due to syncopal episode. EMS advised patients left arm is flaccid. Onset of symptoms was 80min PTA.

## 2021-04-21 NOTE — Consult Note (Signed)
NEUROLOGY TELECONSULTATION NOTE   Date of service: April 21, 2021 Patient Name: [name]. MRN:  [id number] DOB:  January 19, 1971 Reason for consult: telestroke  Requesting Provider: Dr. Varney Biles Consult Participants: myself, patient, bedside RN, telestroke RN Location of the provider: [facility] Location of the patient: [facility]  This consult was provided via telemedicine with 2-way video and audio communication. The patient/family was informed that care would be provided in this way and agreed to receive care in this manner.   _ _ _   _ __   _ __ _ _  __ __   _ __   __ _  History of Present Illness   This is a 51 yo gentleman with hx CHF, CKD, DM2, ESRD on dialysis, a fib on eliquis, HTN, HL who was brought in as preactivated stroke code after an incident of syncope during dialysis after which he woke up with some LUE weakness. LKE 0915. On exam NIHSS = 3 for LUE drift and BLE drift. CT head NAICP (personal review). Not a TNK candidate 2/2 being on eliquis. CTA H&N had poor contrast bolus but vessels appear patent through at least the MCA bifurcation. Exam not c/w LVO therefore that amount of vascular imaging was felt to be sufficient.    ROS   Per HPI; all other systems reviewed and are negative  Past History   The following was personally reviewed:  Past Medical History:  Diagnosis Date   Anxiety    Cellulitis    CHF (congestive heart failure) (HCC)    Chronic diarrhea    Chronic kidney disease    Chronic pain of both ankles    Diabetes mellitus without complication (HCC)    Diabetes mellitus, type II (HCC)    Diabetic neuropathy (HCC)    Frequent falls    Gait instability    GERD (gastroesophageal reflux disease)    Heart palpitations    Hemodialysis patient ([place])    History of kidney stones    HLD (hyperlipidemia)    Hypertension    Insomnia    Nausea and vomiting in adult    recurrent   Past Surgical History:  Procedure Laterality Date   AMPUTATION Left  03/16/2016   Procedure: AMPUTATION DIGIT LEFT HALLUX;  Surgeon: Trula Slade, DPM;  Location: [place];  Service: Podiatry;  Laterality: Left;  can start around 5    AMPUTATION Right 02/09/2021   Procedure: AMPUTATION DIGIT;  Surgeon: Sherilyn Cooter, MD;  Location: [place];  Service: Orthopedics;  Laterality: Right;   ARTHROSCOPIC REPAIR ACL Left    AV FISTULA PLACEMENT Right 05/31/2019   Procedure: Brachiocephalic AV fistula creation;  Surgeon: Algernon Huxley, MD;  Location: [name];  Service: Vascular;  Laterality: Right;   COLONOSCOPY WITH PROPOFOL N/A 10/28/2015   Procedure: COLONOSCOPY WITH PROPOFOL;  Surgeon: Lollie Sails, MD;  Location: [facility];  Service: Endoscopy;  Laterality: N/A;   COLONOSCOPY WITH PROPOFOL N/A 10/29/2015   Procedure: COLONOSCOPY WITH PROPOFOL;  Surgeon: Lollie Sails, MD;  Location: [name] [facility]OSCOPY;  Service: Endoscopy;  Laterality: N/A;   CORONARY/GRAFT ACUTE MI REVASCULARIZATION N/A 02/26/2020   Procedure: Coronary/Graft Acute MI Revascularization;  Surgeon: Jettie Booze, MD;  Location: [place] [facility];  Service: Cardiovascular;  Laterality: N/A;   DIALYSIS/PERMA CATHETER INSERTION Right 04/26/2019   Perm Cath    DIALYSIS/PERMA CATHETER INSERTION N/A 04/26/2019   Procedure: DIALYSIS/PERMA CATHETER INSERTION;  Surgeon: Algernon Huxley, MD;  Location: [place] [facility];  Service: Cardiovascular;  Laterality: N/A;   DIALYSIS/PERMA CATHETER REMOVAL N/A 08/15/2019   Procedure: DIALYSIS/PERMA CATHETER REMOVAL;  Surgeon: Katha Cabal, MD;  Location: [place] [facility];  Service: Cardiovascular;  Laterality: N/A;   EMBOLIZATION Right 02/06/2021   Procedure: EMBOLIZATION;  Surgeon: Algernon Huxley, MD;  Location: [place] [facility];  Service: Cardiovascular;  Laterality: Right;  Right Upper Extremity Dialysis Access, Permcath Placement   ESOPHAGOGASTRODUODENOSCOPY (EGD) WITH PROPOFOL N/A 12/27/2017   Procedure: ESOPHAGOGASTRODUODENOSCOPY (EGD) WITH  PROPOFOL;  Surgeon: Toledo, Benay Pike, MD;  Location: [name] ENDOSCOPY;  Service: Gastroenterology;  Laterality: N/A;   INTRAVASCULAR ULTRASOUND/IVUS N/A 02/26/2020   Procedure: Intravascular Ultrasound/IVUS;  Surgeon: Jettie Booze, MD;  Location: [place] [facility];  Service: Cardiovascular;  Laterality: N/A;   LEFT HEART CATH AND CORONARY ANGIOGRAPHY N/A 02/26/2020   Procedure: LEFT HEART CATH AND CORONARY ANGIOGRAPHY;  Surgeon: Jettie Booze, MD;  Location: [place] [facility];  Service: Cardiovascular;  Laterality: N/A;   PROSTATE SURGERY  2016   TONSILECTOMY/ADENOIDECTOMY WITH MYRINGOTOMY     TONSILLECTOMY     UPPER EXTREMITY ANGIOGRAPHY Right 11/26/2020   Procedure: Upper Extremity Angiography;  Surgeon: Algernon Huxley, MD;  Location: [name] [facility];  Service: Cardiovascular;  Laterality: Right;   UPPER EXTREMITY ANGIOGRAPHY Right 02/05/2021   Procedure: UPPER EXTREMITY ANGIOGRAPHY;  Surgeon: Algernon Huxley, MD;  Location: [place] [facility];  Service: Cardiovascular;  Laterality: Right;   Family History  Problem Relation Age of Onset   CAD Father    Stroke Father    Diabetes Mellitus II Mother    Kidney failure Mother    Schizophrenia Mother    Social History   Socioeconomic History   Marital status: Married    Spouse name: [name]    Number of children: 2   Years of education: Not on file   Highest education level: High school graduate  Occupational History   Occupation: Disability    Comment: not employed  Tobacco Use   Smoking status: Never   Smokeless tobacco: Never  Vaping Use   Vaping Use: Never used  Substance and Sexual Activity   Alcohol use: No   Drug use: No   Sexual activity: Yes  Other Topics Concern   Not on file  Social History Narrative   Oldest son killed in car crash June 2020.    Social Determinants of Health   Financial Resource Strain: Not on file  Food Insecurity: Not on file  Transportation Needs: Not on file  Physical  Activity: Not on file  Stress: Not on file  Social Connections: Not on file   Allergies  Allergen Reactions   Levofloxacin Swelling    Of face Other reaction(s): swelling   Promethazine Diarrhea and Other (See Comments)    Other reaction(s): Other (See Comments), Other (See Comments) Other reaction(s): Muscle cramps Muscle cramps Cramping Other reaction(s): Muscle cramps Muscle cramps   Other     Other reaction(s): Unknown   Malt Other (See Comments)    Other reaction(s): cramping    Medications   (Not in a hospital admission)    No current facility-administered medications for this encounter.  Current Outpatient Medications:    amiodarone (PACERONE) 200 MG tablet, Take 1 tablet (200 mg total) by mouth daily, Disp: 30 tablet, Rfl: 1   apixaban (ELIQUIS) 5 MG TABS tablet, Take 1 tablet (5 mg total) by mouth 2 (two) times daily., Disp: 60 tablet, Rfl: 0   artificial tears (  LACRILUBE) OINT ophthalmic ointment, Place into the left eye 4 (four) times daily., Disp: , Rfl:    atorvastatin (LIPITOR) 80 MG tablet, Take 1 tablet (80 mg total) by mouth daily., Disp: 90 tablet, Rfl: 3   carvedilol (COREG) 25 MG tablet, Take 1 tablet (25 mg total) by mouth 2 (two) times daily with a meal., Disp: 60 tablet, Rfl: 1   clopidogrel (PLAVIX) 75 MG tablet, Take 1 tablet (75 mg total) by mouth daily., Disp: 90 tablet, Rfl: 3   dorzolamide-timolol (COSOPT) 22.3-6.8 MG/ML ophthalmic solution, Place 1 drop into the right eye 2 (two) times daily., Disp: , Rfl:    nitroGLYCERIN (NITROSTAT) 0.4 MG SL tablet, Place 1 tablet (0.4 mg total) under the tongue every 5 (five) minutes as needed for chest pain., Disp: , Rfl:    prednisoLONE acetate (PRED FORTE) 1 % ophthalmic suspension, Place 1 drop into the left eye every 4 (four) hours., Disp: , Rfl:    tamsulosin (FLOMAX) 0.4 MG CAPS capsule, Take 0.4 mg by mouth daily after breakfast. , Disp: , Rfl:   Vitals   Vitals:   04/21/21 1009 04/21/21 1021  04/21/21 1023  BP:  (!) 193/95   Pulse:  81 76  Resp:   15  SpO2: 98% 91% 98%  Weight: 81.6 kg    Height: 6\' 3"  (1.905 m)       Body mass index is 22.5 kg/m.  Physical Exam   Exam performed over telemedicine with 2-way video and audio communication and with assistance of bedside RN  Physical Exam Gen: A&O x4, NAD Resp: normal WOB CV: extremities appear well-perfused  Neuro: *MS: A&O x4. Follows multi-step commands.  *Speech: nondysarthric, no aphasia, able to name and repeat *CN: PERRL 80mm, EOMI, VFF by confrontation, sensation intact, smile symmetric, hearing intact to voice *Motor:   Normal bulk.  No tremor, rigidity or bradykinesia. No pronator drift. RUE full strength, drift but not to bed in LUE and BLE *Sensory: SILT. Symmetric. No double-simultaneous extinction.  *Coordination:  Finger-to-nose, heel-to-shin, rapid alternating motions were intact. *Reflexes:  UTA 2/2 tele-exam *Gait: deferred  NIHSS = 3 for drift in LUE, BLE   Premorbid mRS = 1   Labs   CBC:  Recent Labs  Lab 04/21/21 1004  WBC 6.0  NEUTROABS 3.8  HGB 11.4*  HCT 37.5*  MCV 79.1*  PLT 732    Basic Metabolic Panel:  Lab Results  Component Value Date   NA 138 04/21/2021   K 3.7 04/21/2021   CO2 23 04/21/2021   GLUCOSE 91 04/21/2021   BUN 28 (H) 04/21/2021   CREATININE 6.63 (H) 04/21/2021   CALCIUM 8.5 (L) 04/21/2021   GFRNONAA 9 (L) 04/21/2021   GFRAA 5 (L) 08/27/2019   Lipid Panel:  Lab Results  Component Value Date   LDLCALC 38 02/13/2021   HgbA1c:  Lab Results  Component Value Date   HGBA1C 5.7 (H) 02/13/2021   Urine Drug Screen:     Component Value Date/Time   LABOPIA NONE DETECTED 10/14/2017 1800   COCAINSCRNUR NONE DETECTED 10/14/2017 1800   COCAINSCRNUR NONE DETECTED 07/18/2017 1652   LABBENZ NONE DETECTED 10/14/2017 1800   AMPHETMU NONE DETECTED 10/14/2017 1800   THCU NONE DETECTED 10/14/2017 1800   LABBARB NONE DETECTED 10/14/2017 1800    Alcohol Level      Component Value Date/Time   ETH <10 04/21/2021 1004      Impression   This is a 51 yo gentleman with hx CHF, CKD,  DM2, ESRD on dialysis, a fib on eliquis, HTN, HL who was brought in as preactivated stroke code after an incident of syncope during dialysis after which he woke up with some LUE weakness c/f TIA or acute ischemic stroke. He was not a candidate for TNK 2/2 being on eliquis. CTA H&N did not show LVO (limited visualization past the MCA bifurcations 2/2 poor contrast bolus).  Recommendations   - Admit to hospitalist service for stroke w/u; stroke team will consult - Permissive HTN x48 hrs from sx onset or until stroke ruled out by MRI goal BP <220/110. PRN labetalol or hydralazine if BP above these parameters. Avoid oral antihypertensives. - MRI brain wo contrast STAT. If he has significant acute infarct, contact neurology (teleneurologist Su Monks if before 8pm on 2/28) to determine whether eliquis should be held. Do not administer PM dose for today until d/w neuro or MRI neg for acute infarct.  - TTE w/ bubble - Check A1c and LDL + add statin per guidelines - No indication for antiplatelets in the setting of therapeutic anticoagulation - q4 hr neuro checks - STAT head CT for any change in neuro exam - Tele - PT/OT/SLP - Stroke education - Amb referral to neurology upon discharge   I will be available for questions until 8pm tonight. If nonurgent neuro f/u needed after today please consult teleneurologist Dr. Hortense Ramal.   ______________________________________________________________________   Thank you for the opportunity to take part in the care of this patient. If you have any further questions, please contact the neurology consultation attending.  Signed,  Su Monks, MD [facility] [phone]  If 7pm- 7am, please page neurology on call as listed in [place].

## 2021-04-21 NOTE — ED Notes (Signed)
Pt states he does not make urine any longer, unable to collect sample

## 2021-04-21 NOTE — H&P (Signed)
Patient Demographics:    [name], is a 51 y.o. 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 watershed strokes related to intra dialytic hypotensive episode -Discussed with Neurologist Dr. Alvin Critchley, recommends holding Eliquis for 3 days and then rescanning patient with a noncontrast CT head on 04/24/2021 to make sure there is no bleed/son prior to restarting Eliquis -While off Eliquis ok to treat with aspirin -place on telemetry monitored unit,  -PT/OT eval pending, speech eval pending, - We will allow some permissive hypertension in view of possible acute stroke, avoid precipitous drop in blood pressure.  -check TSH, A1c and fasting lipid profile,  -TTE requested -Continue Lipitor #Please maintain euthermia. Tylenol prn for temp >100.4  2)PAFib with RVR--heart rate 120s to 130s -Avoid over aggressive control in order to avoid hypotension given acute watershed pattern strokes -Hold coreg due to concerns about hypotension risk -use metoprolol for rate as metoprolol is less likely to cause hypotension -Continue amiodarone PTA Eliquis is on hold due to # 1 above  3)ESRD-- TTS--schedule, last HD 04/21/2021 -Patient is anuric -Nephrology consult requested  4)DM2-history of diabetes type 2 with gastroparesis patient's A1c normalized after he lost almost 50 pounds in the setting of gastroparesis -No longer on diabetic medications -Use Novolog/Humalog Sliding scale insulin with  Accu-Cheks/Fingersticks as ordered   5) left eye blindness-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/16/2020 -Patient currently does not have his implant with team  6) chronic anemia of ESRD compounded by iron deficiency anemia on folate and B12 deficiency -Transfuse as clinically indicated  7)H/o CAD-prior MI with DES to LAD 02/2020 -Aspirin as above #1 -Plavix and Eliquis on hold -Metoprolol mostly for rate control -Continue statin  8)Secondary hyperparathyroidism due to ESRD -defer to nephrology service  9) chronic diastolic dysfunction CHF--Echo from 11/2020 shows grade 2 diastolic dysfunction with EF of 55 to 60% -Appears compensated -Continue to use hemodialysis to address volume status -Coreg on hold as noted above in #2  Disposition/Need for in-Hospital Stay- patient unable to be discharged at this time due to acute stroke requiring further neuro work-up*  Dispo: The patient is from: Home              Anticipated d/c is to: SNF              Anticipated d/c date is: > 3 days              Patient currently is not medically stable to d/c. Barriers: Not Clinically Stable-  With History of - Reviewed by me  Past Medical History:  Diagnosis  Date   Anxiety    Cellulitis    CHF (congestive heart failure) (HCC)    Chronic diarrhea    Chronic kidney disease    Chronic pain of both ankles    Diabetes mellitus without complication (HCC)    Diabetes mellitus, type II (HCC)    Diabetic neuropathy (HCC)    Frequent falls    Gait instability    GERD (gastroesophageal reflux disease)    Heart palpitations    Hemodialysis patient ([place])    History of kidney stones    HLD (hyperlipidemia)    Hypertension    Insomnia    Nausea and vomiting in adult    recurrent      Past Surgical History:  Procedure Laterality Date   AMPUTATION Left 03/16/2016   Procedure: AMPUTATION DIGIT LEFT HALLUX;  Surgeon: Trula Slade, DPM;  Location: [place];  Service: Podiatry;  Laterality:  Left;  can start around 5    AMPUTATION Right 02/09/2021   Procedure: AMPUTATION DIGIT;  Surgeon: Sherilyn Cooter, MD;  Location: [place];  Service: Orthopedics;  Laterality: Right;   ARTHROSCOPIC REPAIR ACL Left    AV FISTULA PLACEMENT Right 05/31/2019   Procedure: Brachiocephalic AV fistula creation;  Surgeon: Algernon Huxley, MD;  Location: [name];  Service: Vascular;  Laterality: Right;   COLONOSCOPY WITH PROPOFOL N/A 10/28/2015   Procedure: COLONOSCOPY WITH PROPOFOL;  Surgeon: Lollie Sails, MD;  Location: [facility];  Service: Endoscopy;  Laterality: N/A;   COLONOSCOPY WITH PROPOFOL N/A 10/29/2015   Procedure: COLONOSCOPY WITH PROPOFOL;  Surgeon: Lollie Sails, MD;  Location: [facility] ENDOSCOPY;  Service: Endoscopy;  Laterality: N/A;   CORONARY/GRAFT ACUTE MI REVASCULARIZATION N/A 02/26/2020   Procedure: Coronary/Graft Acute MI Revascularization;  Surgeon: Jettie Booze, MD;  Location: [place] [facility] LAB;  Service: Cardiovascular;  Laterality: N/A;   DIALYSIS/PERMA CATHETER INSERTION Right 04/26/2019   Perm Cath    DIALYSIS/PERMA CATHETER INSERTION N/A 04/26/2019   Procedure: DIALYSIS/PERMA CATHETER INSERTION;  Surgeon: Algernon Huxley, MD;  Location: [place] [facility];  Service: Cardiovascular;  Laterality: N/A;   DIALYSIS/PERMA CATHETER REMOVAL N/A 08/15/2019   Procedure: DIALYSIS/PERMA CATHETER REMOVAL;  Surgeon: Katha Cabal, MD;  Location: [place] [facility];  Service: Cardiovascular;  Laterality: N/A;   EMBOLIZATION Right 02/06/2021   Procedure: EMBOLIZATION;  Surgeon: Algernon Huxley, MD;  Location: [place] [facility] LAB;  Service: Cardiovascular;  Laterality: Right;  Right Upper Extremity Dialysis Access, Permcath Placement   ESOPHAGOGASTRODUODENOSCOPY (EGD) WITH PROPOFOL N/A 12/27/2017   Procedure: ESOPHAGOGASTRODUODENOSCOPY (EGD) WITH PROPOFOL;  Surgeon: [place], Benay Pike, MD;  Location: [name] ENDOSCOPY;  Service: Gastroenterology;  Laterality: N/A;   INTRAVASCULAR  ULTRASOUND/IVUS N/A 02/26/2020   Procedure: Intravascular Ultrasound/IVUS;  Surgeon: Jettie Booze, MD;  Location: [place] [facility];  Service: Cardiovascular;  Laterality: N/A;   LEFT HEART CATH AND CORONARY ANGIOGRAPHY N/A 02/26/2020   Procedure: LEFT HEART CATH AND CORONARY ANGIOGRAPHY;  Surgeon: Jettie Booze, MD;  Location: [place] [facility];  Service: Cardiovascular;  Laterality: N/A;   PROSTATE SURGERY  2016   TONSILECTOMY/ADENOIDECTOMY WITH MYRINGOTOMY     TONSILLECTOMY     UPPER EXTREMITY ANGIOGRAPHY Right 11/26/2020   Procedure: Upper Extremity Angiography;  Surgeon: Algernon Huxley, MD;  Location: [place] CV LAB;  Service: Cardiovascular;  Laterality: Right;   UPPER EXTREMITY ANGIOGRAPHY Right 02/05/2021   Procedure: UPPER EXTREMITY ANGIOGRAPHY;  Surgeon: Algernon Huxley, MD;  Location: [place] CV LAB;  Service:  Cardiovascular;  Laterality: Right;    Chief Complaint  Patient presents with   Code Stroke      HPI:    [name]  is a 51 y.o. 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 Fib, DChF, who presents from hemodialysis center with sudden onset of left-sided weakness -In the ED code stroke was activated neurology consult appreciate -MRI brain without acute strokes in watershed pattern please see report -CTA head and neck without LVO Creatinine 6.6 BUN is 28 sodium is 138 potassium 3.7 -Hgb is 11.4 MCV and MCH are low platelets 156 WBC 6.0 -COVID and flu negative -Additional history obtained from patient's wife at bedside, questions answered --Stroke pathway activated   Review of systems:    In addition to the HPI above,   A full Review of  Systems was done, all other systems reviewed are negative except as noted above in HPI , .    Social History:  Reviewed by me    Social History   Tobacco  Use   Smoking status: Never   Smokeless tobacco: Never  Substance Use Topics   Alcohol use: No     Family History :  Reviewed by me    Family History  Problem Relation Age of Onset   CAD Father    Stroke Father    Diabetes Mellitus II Mother    Kidney failure Mother    Schizophrenia Mother      Home Medications:   Prior to Admission medications   Medication Sig Start Date End Date Taking? Authorizing Provider  amiodarone (PACERONE) 200 MG tablet Take 1 tablet (200 mg total) by mouth daily 02/10/21  Yes Charlynne Cousins, MD  apixaban (ELIQUIS) 5 MG TABS tablet Take 1 tablet (5 mg total) by mouth 2 (two) times daily. 02/10/21  Yes Charlynne Cousins, MD  artificial tears (LACRILUBE) OINT ophthalmic ointment Place into the left eye 4 (four) times daily. 02/08/21  Yes Elodia Florence., MD  atorvastatin (LIPITOR) 80 MG tablet Take 1 tablet (80 mg total) by mouth daily. 04/20/21 04/15/22 Yes Loel Dubonnet, NP  carvedilol (COREG) 25 MG tablet Take 1 tablet (25 mg total) by mouth 2 (two) times daily with a meal. 02/10/21  Yes Charlynne Cousins, MD  clopidogrel (PLAVIX) 75 MG tablet Take 1 tablet (75 mg total) by mouth daily. 02/10/21  Yes Charlynne Cousins, MD  dorzolamide-timolol (COSOPT) 22.3-6.8 MG/ML ophthalmic solution Place 1 drop into the right eye 2 (two) times daily.   Yes [provider]  nitroGLYCERIN (NITROSTAT) 0.4 MG SL tablet Place 1 tablet (0.4 mg total) under the tongue every 5 (five) minutes as needed for chest pain. 12/09/20 12/09/21 Yes Johnson, Clanford L, MD  prednisoLONE acetate (PRED FORTE) 1 % ophthalmic suspension Place 1 drop into the left eye every 4 (four) hours.   Yes [provider]  tamsulosin (FLOMAX) 0.4 MG CAPS capsule Take 0.4 mg by mouth daily after breakfast.    Yes [provider]     Allergies:     Allergies  Allergen Reactions   Levofloxacin Swelling    Of face Other reaction(s): swelling    Promethazine Diarrhea and Other (See Comments)    Other reaction(s): Other (See Comments), Other (See Comments) Other reaction(s): Muscle cramps Muscle cramps Cramping Other reaction(s): Muscle cramps Muscle cramps   Other  Other reaction(s): Unknown   Malt Other (See Comments)    Other reaction(s): cramping     Physical Exam:   Vitals  Blood pressure 115/78, pulse (!) 109, temperature (!) 97.4 F (36.3 C), temperature source Axillary, resp. rate 15, height 6\' 3"  (1.905 m), weight 81.7 kg, SpO2 96 %.  Physical Examination: General appearance - alert  and in no distress  Mental status - alert, oriented to person, place, and time,  Eyes - sclera anicteric, left eye missing Neck - supple, left IJ hemodialysis catheter Chest - clear  to auscultation bilaterally, symmetrical air movement, Heart - S1 and S2 normal, irregularly irregular and tachycardic abdomen - soft, nontender, nondistended, no masses or organomegaly Neurological -left-sided weakness especially left upper extremity - extremities - no pedal edema noted, intact peripheral pulses  Skin - warm, dry MSK-right big toe amputation, fourth right finger amputation, left middle finger partial amputation,   Data Review:    CBC Recent Labs  Lab 04/21/21 1004  WBC 6.0  HGB 11.4*  HCT 37.5*  PLT 156  MCV 79.1*  MCH 24.1*  MCHC 30.4  RDW 20.4*  LYMPHSABS 1.3  MONOABS 0.7  EOSABS 0.1  BASOSABS 0.0   ------------------------------------------------------------------------------------------------------------------  Chemistries  Recent Labs  Lab 04/21/21 1004  NA 138  K 3.7  CL 103  CO2 23  GLUCOSE 91  BUN 28*  CREATININE 6.63*  CALCIUM 8.5*  AST 14*  ALT 11  ALKPHOS 80  BILITOT 0.7   ------------------------------------------------------------------------------------------------------------------ estimated creatinine clearance is 14.9 mL/min (A) (by C-G formula based on SCr of 6.63 mg/dL  (H)). ------------------------------------------------------------------------------------------------------------------ No results for input(s): TSH, T4TOTAL, T3FREE, THYROIDAB in the last 72 hours.  Invalid input(s): FREET3   Coagulation profile Recent Labs  Lab 04/21/21 1004  INR 1.1   ------------------------------------------------------------------------------------------------------------------- No results for input(s): DDIMER in the last 72 hours. -------------------------------------------------------------------------------------------------------------------  Cardiac Enzymes No results for input(s): CKMB, TROPONINI, MYOGLOBIN in the last 168 hours.  Invalid input(s): CK ------------------------------------------------------------------------------------------------------------------    Component Value Date/Time   BNP 482.0 (H) 12/08/2020 1004     ---------------------------------------------------------------------------------------------------------------  Urinalysis    Component Value Date/Time   COLORURINE YELLOW (A) 11/07/2017 1855   APPEARANCEUR CLOUDY (A) 11/07/2017 1855   LABSPEC 1.016 11/07/2017 1855   PHURINE 5.0 11/07/2017 1855   GLUCOSEU >=500 (A) 11/07/2017 1855   HGBUR MODERATE (A) 11/07/2017 1855   BILIRUBINUR NEGATIVE 11/07/2017 1855   KETONESUR NEGATIVE 11/07/2017 1855   PROTEINUR >=300 (A) 11/07/2017 1855   NITRITE NEGATIVE 11/07/2017 1855   LEUKOCYTESUR NEGATIVE 11/07/2017 1855    ----------------------------------------------------------------------------------------------------------------   Imaging Results:    MR BRAIN WO CONTRAST  Result Date: 04/21/2021 CLINICAL DATA:  Provided history: Neuro deficit, acute, stroke suspected. EXAM: MRI HEAD WITHOUT CONTRAST TECHNIQUE: Multiplanar, multiecho pulse sequences of the brain and surrounding structures were obtained without intravenous contrast. COMPARISON:  CT angiogram head/neck  04/21/2021. Non-contrast head CT 04/21/2021. MRI brain and MRA head 02/13/2021. FINDINGS: Brain: Cerebral volume is normal. There are patchy acute cortically-based infarcts within the bilateral high frontal lobes and right parietal lobe (predominantly within a watershed distribution). A subcentimeter acute infarct is also present within the callosal splenium on the left. Mild multifocal T2 FLAIR hyperintense signal abnormality within the cerebral white matter and pons, nonspecific but compatible chronic small vessel ischemic disease. Redemonstrated chronic lacunar infarct within the right thalamus. No evidence of an intracranial mass. No chronic intracranial blood products. No extra-axial fluid collection. No midline shift. Vascular: Maintained flow voids within the proximal  large arterial vessels. Skull and upper cervical spine: No focal suspicious marrow lesion. Incompletely assessed cervical spondylosis. Sinuses/Orbits: Visualized orbits show no acute finding. Left globe prosthesis. Prior right ocular lens replacement. Mild mucosal thickening within the bilateral ethmoid, sphenoid and maxillary sinuses. IMPRESSION: Patchy acute cortically-based infarcts within the bilateral high frontal lobes and right parietal lobe (predominantly in a watershed distribution). Subcentimeter acute infarct within the callosal splenium on the left. Mild chronic small vessel ischemic changes within the cerebral white matter and pons, similar as compared to the prior brain MRI of 02/13/2021. Redemonstrated chronic lacunar infarct within the right thalamus. Mild mucosal thickening within the bilateral ethmoid, sphenoid and maxillary sinuses. Electronically Signed   By: Kellie Simmering D.O.   On: 04/21/2021 12:41   CT HEAD CODE STROKE WO CONTRAST  Result Date: 04/21/2021 CLINICAL DATA:  Code stroke. Neuro deficit, acute, stroke suspected. Sudden onset left-sided arm weakness and drift. EXAM: CT HEAD WITHOUT CONTRAST TECHNIQUE:  Contiguous axial images were obtained from the base of the skull through the vertex without intravenous contrast. RADIATION DOSE REDUCTION: This exam was performed according to the departmental dose-optimization program which includes automated exposure control, adjustment of the mA and/or kV according to patient size and/or use of iterative reconstruction technique. COMPARISON:  Head CT 02/12/2021 and MRI 02/13/2021 FINDINGS: Brain: There is no evidence of an acute infarct, intracranial hemorrhage, mass, midline shift, or extra-axial fluid collection. There is mild cerebral atrophy. Hypodensities in the cerebral white matter bilaterally are unchanged and nonspecific but compatible with mild chronic small vessel ischemic disease. Vascular: Calcified atherosclerosis at the skull base. No hyperdense vessel. Skull: No fracture or suspicious osseous lesion. Sinuses/Orbits: Visualized paranasal sinuses and mastoid air cells are clear. Right cataract extraction. Left ocular prosthesis. Other: None. ASPECTS [facility] Stroke Program Early CT Score) - Ganglionic level infarction (caudate, lentiform nuclei, internal capsule, insula, M1-M3 cortex): 7 - Supraganglionic infarction (M4-M6 cortex): 3 Total score (0-10 with 10 being normal): 10 IMPRESSION: 1. No evidence of acute intracranial abnormality. ASPECTS of 10. 2. Mild chronic small vessel ischemic disease. These results were called by telephone at the time of interpretation on 04/21/2021 at 10:20 am to Dr. Varney Biles, who verbally acknowledged these results. Electronically Signed   By: Logan Bores M.D.   On: 04/21/2021 10:22   CT ANGIO HEAD NECK W WO CM (CODE STROKE)  Result Date: 04/21/2021 CLINICAL DATA:  Neuro deficit, acute, stroke suspected. Left-sided weakness. EXAM: CT ANGIOGRAPHY HEAD AND NECK TECHNIQUE: Multidetector CT imaging of the head and neck was performed using the standard protocol during bolus administration of intravenous contrast. Multiplanar CT  image reconstructions and MIPs were obtained to evaluate the vascular anatomy. Carotid stenosis measurements (when applicable) are obtained utilizing NASCET criteria, using the distal internal carotid diameter as the denominator. RADIATION DOSE REDUCTION: This exam was performed according to the departmental dose-optimization program which includes automated exposure control, adjustment of the mA and/or kV according to patient size and/or use of iterative reconstruction technique. CONTRAST:  23mL OMNIPAQUE IOHEXOL 350 MG/ML SOLN COMPARISON:  Head MRA 02/13/2021. Carotid Doppler ultrasound 02/13/2021. FINDINGS: CTA NECK FINDINGS Aortic arch: Standard 3 vessel aortic arch. Calcified plaque at the left subclavian artery origin without evidence associated significant stenosis. Right carotid system: Patent with minimal plaque in the distal common carotid artery. No evidence of a significant stenosis or dissection. Left carotid system: Patent with minimal plaque at the carotid bifurcation. No evidence of a significant stenosis or dissection. Vertebral arteries: Patent with the right  being moderately dominant. No definite evidence of a flow limiting stenosis or dissection although poor opacification limits assessment. Skeleton: Focally advanced disc degeneration at C3-4, likely with moderate spinal stenosis and moderate left neural foraminal stenosis. Other neck: No evidence of cervical lymphadenopathy or mass. Upper chest: Clear lung apices. Review of the MIP images confirms the above findings CTA HEAD FINDINGS Assessment is limited by poor arterial opacification, with largely nondiagnostic evaluation of the small and medium-sized arteries. Anterior circulation: The intracranial internal carotid arteries are patent with mild atherosclerotic plaque bilaterally. The MCAs are patent through the bifurcations with poor visualization of the branch vessels. The ACAs are also grossly patent proximally. No sizable aneurysm is  identified. Posterior circulation: The intracranial vertebral arteries are patent to the basilar with right greater than left V4 segment atherosclerosis. The basilar artery is patent, and the proximal PCAs are grossly patent. No sizable aneurysm is identified. Venous sinuses: Patent. Anatomic variants: None. Review of the MIP images confirms the above findings IMPRESSION: Limited study due to poor arterial opacification as detailed above. No evidence of a large vessel occlusion through the level of the MCA bifurcations. These results were called by telephone at the time of interpretation on 04/21/2021 at 10:20 am to Dr. Varney Biles, who verbally acknowledged these results. The study was also later discussed by telephone with Dr. Quinn Axe. Electronically Signed   By: Logan Bores M.D.   On: 04/21/2021 10:40    Radiological Exams on Admission: MR BRAIN WO CONTRAST  Result Date: 04/21/2021 CLINICAL DATA:  Provided history: Neuro deficit, acute, stroke suspected. EXAM: MRI HEAD WITHOUT CONTRAST TECHNIQUE: Multiplanar, multiecho pulse sequences of the brain and surrounding structures were obtained without intravenous contrast. COMPARISON:  CT angiogram head/neck 04/21/2021. Non-contrast head CT 04/21/2021. MRI brain and MRA head 02/13/2021. FINDINGS: Brain: Cerebral volume is normal. There are patchy acute cortically-based infarcts within the bilateral high frontal lobes and right parietal lobe (predominantly within a watershed distribution). A subcentimeter acute infarct is also present within the callosal splenium on the left. Mild multifocal T2 FLAIR hyperintense signal abnormality within the cerebral white matter and pons, nonspecific but compatible chronic small vessel ischemic disease. Redemonstrated chronic lacunar infarct within the right thalamus. No evidence of an intracranial mass. No chronic intracranial blood products. No extra-axial fluid collection. No midline shift. Vascular: Maintained flow voids  within the proximal large arterial vessels. Skull and upper cervical spine: No focal suspicious marrow lesion. Incompletely assessed cervical spondylosis. Sinuses/Orbits: Visualized orbits show no acute finding. Left globe prosthesis. Prior right ocular lens replacement. Mild mucosal thickening within the bilateral ethmoid, sphenoid and maxillary sinuses. IMPRESSION: Patchy acute cortically-based infarcts within the bilateral high frontal lobes and right parietal lobe (predominantly in a watershed distribution). Subcentimeter acute infarct within the callosal splenium on the left. Mild chronic small vessel ischemic changes within the cerebral white matter and pons, similar as compared to the prior brain MRI of 02/13/2021. Redemonstrated chronic lacunar infarct within the right thalamus. Mild mucosal thickening within the bilateral ethmoid, sphenoid and maxillary sinuses. Electronically Signed   By: Kellie Simmering D.O.   On: 04/21/2021 12:41   CT HEAD CODE STROKE WO CONTRAST  Result Date: 04/21/2021 CLINICAL DATA:  Code stroke. Neuro deficit, acute, stroke suspected. Sudden onset left-sided arm weakness and drift. EXAM: CT HEAD WITHOUT CONTRAST TECHNIQUE: Contiguous axial images were obtained from the base of the skull through the vertex without intravenous contrast. RADIATION DOSE REDUCTION: This exam was performed according to the departmental dose-optimization program which  includes automated exposure control, adjustment of the mA and/or kV according to patient size and/or use of iterative reconstruction technique. COMPARISON:  Head CT 02/12/2021 and MRI 02/13/2021 FINDINGS: Brain: There is no evidence of an acute infarct, intracranial hemorrhage, mass, midline shift, or extra-axial fluid collection. There is mild cerebral atrophy. Hypodensities in the cerebral white matter bilaterally are unchanged and nonspecific but compatible with mild chronic small vessel ischemic disease. Vascular: Calcified  atherosclerosis at the skull base. No hyperdense vessel. Skull: No fracture or suspicious osseous lesion. Sinuses/Orbits: Visualized paranasal sinuses and mastoid air cells are clear. Right cataract extraction. Left ocular prosthesis. Other: None. ASPECTS [facility] Stroke Program Early CT Score) - Ganglionic level infarction (caudate, lentiform nuclei, internal capsule, insula, M1-M3 cortex): 7 - Supraganglionic infarction (M4-M6 cortex): 3 Total score (0-10 with 10 being normal): 10 IMPRESSION: 1. No evidence of acute intracranial abnormality. ASPECTS of 10. 2. Mild chronic small vessel ischemic disease. These results were called by telephone at the time of interpretation on 04/21/2021 at 10:20 am to Dr. Varney Biles, who verbally acknowledged these results. Electronically Signed   By: Logan Bores M.D.   On: 04/21/2021 10:22   CT ANGIO HEAD NECK W WO CM (CODE STROKE)  Result Date: 04/21/2021 CLINICAL DATA:  Neuro deficit, acute, stroke suspected. Left-sided weakness. EXAM: CT ANGIOGRAPHY HEAD AND NECK TECHNIQUE: Multidetector CT imaging of the head and neck was performed using the standard protocol during bolus administration of intravenous contrast. Multiplanar CT image reconstructions and MIPs were obtained to evaluate the vascular anatomy. Carotid stenosis measurements (when applicable) are obtained utilizing NASCET criteria, using the distal internal carotid diameter as the denominator. RADIATION DOSE REDUCTION: This exam was performed according to the departmental dose-optimization program which includes automated exposure control, adjustment of the mA and/or kV according to patient size and/or use of iterative reconstruction technique. CONTRAST:  78mL OMNIPAQUE IOHEXOL 350 MG/ML SOLN COMPARISON:  Head MRA 02/13/2021. Carotid Doppler ultrasound 02/13/2021. FINDINGS: CTA NECK FINDINGS Aortic arch: Standard 3 vessel aortic arch. Calcified plaque at the left subclavian artery origin without evidence  associated significant stenosis. Right carotid system: Patent with minimal plaque in the distal common carotid artery. No evidence of a significant stenosis or dissection. Left carotid system: Patent with minimal plaque at the carotid bifurcation. No evidence of a significant stenosis or dissection. Vertebral arteries: Patent with the right being moderately dominant. No definite evidence of a flow limiting stenosis or dissection although poor opacification limits assessment. Skeleton: Focally advanced disc degeneration at C3-4, likely with moderate spinal stenosis and moderate left neural foraminal stenosis. Other neck: No evidence of cervical lymphadenopathy or mass. Upper chest: Clear lung apices. Review of the MIP images confirms the above findings CTA HEAD FINDINGS Assessment is limited by poor arterial opacification, with largely nondiagnostic evaluation of the small and medium-sized arteries. Anterior circulation: The intracranial internal carotid arteries are patent with mild atherosclerotic plaque bilaterally. The MCAs are patent through the bifurcations with poor visualization of the branch vessels. The ACAs are also grossly patent proximally. No sizable aneurysm is identified. Posterior circulation: The intracranial vertebral arteries are patent to the basilar with right greater than left V4 segment atherosclerosis. The basilar artery is patent, and the proximal PCAs are grossly patent. No sizable aneurysm is identified. Venous sinuses: Patent. Anatomic variants: None. Review of the MIP images confirms the above findings IMPRESSION: Limited study due to poor arterial opacification as detailed above. No evidence of a large vessel occlusion through the level of the MCA bifurcations.  These results were called by telephone at the time of interpretation on 04/21/2021 at 10:20 am to Dr. Varney Biles, who verbally acknowledged these results. The study was also later discussed by telephone with Dr. Quinn Axe.  Electronically Signed   By: Logan Bores M.D.   On: 04/21/2021 10:40    DVT Prophylaxis -SCD  /heparin AM Labs Ordered, also please review Full Orders  Family Communication: Admission, patients condition and plan of care including tests being ordered have been discussed with the patient and wife at bedside who indicate understanding and agree with the plan   Code Status - Full Code  Likely DC to  home Vs SNF pending further neuro eval  Condition   stable  Roxan Hockey M.D on 04/21/2021 at 3:56 PM Go to [email or web address] -  for contact info  [facility] - Office  [phone]

## 2021-04-21 NOTE — Progress Notes (Signed)
°   04/21/21 1357  Assess: MEWS Score  Temp (!) 97.4 F (36.3 C)  BP 108/71 (notified dr courage, states okay, no concern with drop in BP since patient is resting and less agitated.)  Pulse Rate (!) 112  Resp 18  Level of Consciousness Alert  SpO2 96 %  O2 Device Room Air  Assess: MEWS Score  MEWS Temp 0  MEWS Systolic 0  MEWS Pulse 2  MEWS RR 0  MEWS LOC 0  MEWS Score 2  MEWS Score Color Yellow  Assess: if the MEWS score is Yellow or Red  Were vital signs taken at a resting state? Yes  Focused Assessment No change from prior assessment  Early Detection of Sepsis Score *See Row Information* Low  MEWS guidelines implemented *See Row Information* Yes  Treat  MEWS Interventions Administered scheduled meds/treatments;Administered prn meds/treatments;Other (Comment) (notified dr courage, HR had been sustainining in the 130s, EKG obtained per order, afib with RVR, long QT, notified Dr Joesph Fillers. Orders received for oral metoprolol and amiodarone and metoprolol 5 mg IV x 1.)  Pain Scale 0-10  Pain Score 0  Take Vital Signs  Increase Vital Sign Frequency  Yellow: Q 2hr X 2 then Q 4hr X 2, if remains yellow, continue Q 4hrs  Escalate  MEWS: Escalate Yellow: discuss with charge nurse/RN and consider discussing with provider and RRT  Notify: Charge Nurse/RN  Name of Charge Nurse/RN Notified Deirdre Pippins, RN  Date Charge Nurse/RN Notified 04/21/21  Time Charge Nurse/RN Notified 1357  Notify: Provider  Provider Name/Title dr courage  Date Provider Notified 04/21/21  Time Provider Notified 1358  Notification Type Face-to-face  Notification Reason Other (Comment) (HR dropped to 110s after metoprolol as ordered)  Provider response At bedside;No new orders (nursing to continue to monitor)  Date of Provider Response 04/21/21  Time of Provider Response 1358  Document  Patient Outcome Other (Comment) (HR stabilizing, monitoring BP for stroke workup per provider orders)

## 2021-04-21 NOTE — Consult Note (Signed)
1004-Code Stroke called 1014-Dr Stack on camera 1018-Returned from CT

## 2021-04-21 NOTE — Progress Notes (Signed)
MRI brain showed: 1. 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 2. Subcentimeter acute infarct within the callosal splenium on the left.  Recommend holding eliquis x3 days to reduce risk of hemorrhagic conversion and repeating noncon head CT before restart to r/o interval hemorrhagic conversion. He should be started on ASA 81mg  daily while off anticoagulation, and aspirin should be stopped when eliquis is restarted. Patient is currently in a fib with RVR. Hypotension should be avoided as much as porrible during tx of RVR given the potential to extend his watershed infarcts. If further neurology consultation is required after today, please consult teleneurologist Dr. Hortense Ramal.   Su Monks, MD [facility] [phone]  If 7pm- 7am, please page neurology on call as listed in [place].

## 2021-04-21 NOTE — ED Notes (Signed)
Pt to MRI

## 2021-04-22 ENCOUNTER — Ambulatory Visit (INDEPENDENT_AMBULATORY_CARE_PROVIDER_SITE_OTHER): Payer: Medicare Other | Admitting: Nurse Practitioner

## 2021-04-22 ENCOUNTER — Observation Stay (HOSPITAL_COMMUNITY): Payer: Medicare HMO

## 2021-04-22 ENCOUNTER — Other Ambulatory Visit (HOSPITAL_COMMUNITY): Payer: Self-pay | Admitting: *Deleted

## 2021-04-22 ENCOUNTER — Encounter (INDEPENDENT_AMBULATORY_CARE_PROVIDER_SITE_OTHER): Payer: Medicare Other

## 2021-04-22 DIAGNOSIS — R195 Other fecal abnormalities: Secondary | ICD-10-CM | POA: Diagnosis not present

## 2021-04-22 DIAGNOSIS — I5032 Chronic diastolic (congestive) heart failure: Secondary | ICD-10-CM | POA: Diagnosis present

## 2021-04-22 DIAGNOSIS — R569 Unspecified convulsions: Secondary | ICD-10-CM | POA: Diagnosis not present

## 2021-04-22 DIAGNOSIS — I959 Hypotension, unspecified: Secondary | ICD-10-CM | POA: Diagnosis present

## 2021-04-22 DIAGNOSIS — Z955 Presence of coronary angioplasty implant and graft: Secondary | ICD-10-CM | POA: Diagnosis not present

## 2021-04-22 DIAGNOSIS — E1122 Type 2 diabetes mellitus with diabetic chronic kidney disease: Secondary | ICD-10-CM | POA: Diagnosis present

## 2021-04-22 DIAGNOSIS — Z8673 Personal history of transient ischemic attack (TIA), and cerebral infarction without residual deficits: Secondary | ICD-10-CM | POA: Diagnosis not present

## 2021-04-22 DIAGNOSIS — K659 Peritonitis, unspecified: Secondary | ICD-10-CM | POA: Diagnosis present

## 2021-04-22 DIAGNOSIS — D509 Iron deficiency anemia, unspecified: Secondary | ICD-10-CM | POA: Diagnosis present

## 2021-04-22 DIAGNOSIS — F339 Major depressive disorder, recurrent, unspecified: Secondary | ICD-10-CM | POA: Diagnosis present

## 2021-04-22 DIAGNOSIS — R253 Fasciculation: Secondary | ICD-10-CM | POA: Diagnosis present

## 2021-04-22 DIAGNOSIS — Z20822 Contact with and (suspected) exposure to covid-19: Secondary | ICD-10-CM | POA: Diagnosis present

## 2021-04-22 DIAGNOSIS — H5462 Unqualified visual loss, left eye, normal vision right eye: Secondary | ICD-10-CM | POA: Diagnosis present

## 2021-04-22 DIAGNOSIS — I639 Cerebral infarction, unspecified: Secondary | ICD-10-CM | POA: Diagnosis not present

## 2021-04-22 DIAGNOSIS — N186 End stage renal disease: Secondary | ICD-10-CM | POA: Diagnosis present

## 2021-04-22 DIAGNOSIS — R34 Anuria and oliguria: Secondary | ICD-10-CM | POA: Diagnosis present

## 2021-04-22 DIAGNOSIS — G8194 Hemiplegia, unspecified affecting left nondominant side: Secondary | ICD-10-CM | POA: Diagnosis present

## 2021-04-22 DIAGNOSIS — I132 Hypertensive heart and chronic kidney disease with heart failure and with stage 5 chronic kidney disease, or end stage renal disease: Secondary | ICD-10-CM | POA: Diagnosis present

## 2021-04-22 DIAGNOSIS — R29703 NIHSS score 3: Secondary | ICD-10-CM | POA: Diagnosis present

## 2021-04-22 DIAGNOSIS — R531 Weakness: Secondary | ICD-10-CM | POA: Diagnosis not present

## 2021-04-22 DIAGNOSIS — I6389 Other cerebral infarction: Secondary | ICD-10-CM | POA: Diagnosis not present

## 2021-04-22 DIAGNOSIS — Z8249 Family history of ischemic heart disease and other diseases of the circulatory system: Secondary | ICD-10-CM | POA: Diagnosis not present

## 2021-04-22 DIAGNOSIS — N2581 Secondary hyperparathyroidism of renal origin: Secondary | ICD-10-CM | POA: Diagnosis present

## 2021-04-22 DIAGNOSIS — I10 Essential (primary) hypertension: Secondary | ICD-10-CM | POA: Diagnosis not present

## 2021-04-22 DIAGNOSIS — Z992 Dependence on renal dialysis: Secondary | ICD-10-CM | POA: Diagnosis not present

## 2021-04-22 DIAGNOSIS — D631 Anemia in chronic kidney disease: Secondary | ICD-10-CM | POA: Diagnosis present

## 2021-04-22 DIAGNOSIS — I3139 Other pericardial effusion (noninflammatory): Secondary | ICD-10-CM | POA: Diagnosis present

## 2021-04-22 DIAGNOSIS — I480 Paroxysmal atrial fibrillation: Secondary | ICD-10-CM | POA: Diagnosis present

## 2021-04-22 DIAGNOSIS — I69398 Other sequelae of cerebral infarction: Secondary | ICD-10-CM | POA: Diagnosis not present

## 2021-04-22 DIAGNOSIS — I2510 Atherosclerotic heart disease of native coronary artery without angina pectoris: Secondary | ICD-10-CM | POA: Diagnosis present

## 2021-04-22 HISTORY — DX: Cerebral infarction, unspecified: I63.9

## 2021-04-22 LAB — LIPID PANEL
Cholesterol: 132 mg/dL (ref 0–200)
HDL: 48 mg/dL (ref >40)
LDL Cholesterol: 69 mg/dL (ref 0–99)
Total CHOL/HDL Ratio: 2.8 RATIO
Triglycerides: 74 mg/dL (ref <150)
VLDL: 15 mg/dL (ref 0–40)

## 2021-04-22 LAB — GLUCOSE, CAPILLARY
Glucose-Capillary: 91 mg/dL (ref 70–99)
Glucose-Capillary: 79 mg/dL (ref 70–99)
Glucose-Capillary: 89 mg/dL (ref 70–99)
Glucose-Capillary: 102 mg/dL — ABNORMAL HIGH (ref 70–99)

## 2021-04-22 LAB — BASIC METABOLIC PANEL
Anion gap: 10 (ref 5–15)
BUN: 43 mg/dL — ABNORMAL HIGH (ref 6–20)
CO2: 21 mmol/L — ABNORMAL LOW (ref 22–32)
Calcium: 8.5 mg/dL — ABNORMAL LOW (ref 8.9–10.3)
Chloride: 105 mmol/L (ref 98–111)
Creatinine, Ser: 8.77 mg/dL — ABNORMAL HIGH (ref 0.61–1.24)
GFR, Estimated: 7 mL/min — ABNORMAL LOW (ref >60)
Glucose, Bld: 70 mg/dL (ref 70–99)
Potassium: 4.3 mmol/L (ref 3.5–5.1)
Sodium: 136 mmol/L (ref 135–145)

## 2021-04-22 LAB — CBC
HCT: 36.7 % — ABNORMAL LOW (ref 39.0–52.0)
Hemoglobin: 10.7 g/dL — ABNORMAL LOW (ref 13.0–17.0)
MCH: 23.3 pg — ABNORMAL LOW (ref 26.0–34.0)
MCHC: 29.2 g/dL — ABNORMAL LOW (ref 30.0–36.0)
MCV: 80 fL (ref 80.0–100.0)
Platelets: 156 10*3/uL (ref 150–400)
RBC: 4.59 MIL/uL (ref 4.22–5.81)
RDW: 20.2 % — ABNORMAL HIGH (ref 11.5–15.5)
WBC: 9.3 10*3/uL (ref 4.0–10.5)
nRBC: 0 % (ref 0.0–0.2)

## 2021-04-22 LAB — ECHOCARDIOGRAM COMPLETE
Area-P 1/2: 3.17 cm2
Height: 75 in
S' Lateral: 3.1 cm
Weight: 2906.54 oz

## 2021-04-22 LAB — TSH: TSH: 3.772 u[IU]/mL (ref 0.350–4.500)

## 2021-04-22 MED ORDER — CHLORHEXIDINE GLUCONATE CLOTH 2 % EX PADS
6.0000 | MEDICATED_PAD | Freq: Every day | CUTANEOUS | Status: DC
  Administered 2021-04-24: 6 via TOPICAL

## 2021-04-22 NOTE — Consult Note (Addendum)
[facility] Renal Consultation Note    Indication for Consultation:  Management of ESRD/hemodialysis; anemia, hypertension/volume and secondary hyperparathyroidism  HPI: [name]. is a 51 y.o. male with a PMH significant for CVA, chronic CHF, DM, HTN, left eye enucleation, atrial fibrillation on Eliquis, and ESRD on HD TTS at [name] who was brought to [facility] ED via EMS from dialysis after he had a syncopal event and woke with LUE weakness.  In the ED, he was hypertensive at 193/95, SpO2 98% on room air, pulse 77.  Covid-19/flu negative.  MRI of the brain revealed bilateral acute, cortically-based infarcts within bilateral high frontal lobes and right parietal lobe (predominantly in a watershed distribution).  He was admitted under observation and we were consulted to provide HD during his hospitalization.   He doesn't remember what happened but his wife reports that he had a similar episode at the end of December.  MRI at that time showed punctate acute infarct of the left hippocampus.  Past Medical History:  Diagnosis Date   Anxiety    Cellulitis    CHF (congestive heart failure) (HCC)    Chronic diarrhea    Chronic kidney disease    Chronic pain of both ankles    Diabetes mellitus without complication (HCC)    Diabetes mellitus, type II (HCC)    Diabetic neuropathy (HCC)    Frequent falls    Gait instability    GERD (gastroesophageal reflux disease)    Heart palpitations    Hemodialysis patient ([place])    History of kidney stones    HLD (hyperlipidemia)    Hypertension    Insomnia    Nausea and vomiting in adult    recurrent   Past Surgical History:  Procedure Laterality Date   AMPUTATION Left 03/16/2016   Procedure: AMPUTATION DIGIT LEFT HALLUX;  Surgeon: Trula Slade, DPM;  Location: [place];  Service: Podiatry;  Laterality: Left;  can start around 5    AMPUTATION Right 02/09/2021   Procedure: AMPUTATION DIGIT;  Surgeon: Sherilyn Cooter, MD;   Location: [place];  Service: Orthopedics;  Laterality: Right;   ARTHROSCOPIC REPAIR ACL Left    AV FISTULA PLACEMENT Right 05/31/2019   Procedure: Brachiocephalic AV fistula creation;  Surgeon: Algernon Huxley, MD;  Location: [name]S;  Service: Vascular;  Laterality: Right;   COLONOSCOPY WITH PROPOFOL N/A 10/28/2015   Procedure: COLONOSCOPY WITH PROPOFOL;  Surgeon: Lollie Sails, MD;  Location: [facility] ENDOSCOPY;  Service: Endoscopy;  Laterality: N/A;   COLONOSCOPY WITH PROPOFOL N/A 10/29/2015   Procedure: COLONOSCOPY WITH PROPOFOL;  Surgeon: Lollie Sails, MD;  Location: [facility] ENDOSCOPY;  Service: Endoscopy;  Laterality: N/A;   CORONARY/GRAFT ACUTE MI REVASCULARIZATION N/A 02/26/2020   Procedure: Coronary/Graft Acute MI Revascularization;  Surgeon: Jettie Booze, MD;  Location: [place] CV LAB;  Service: Cardiovascular;  Laterality: N/A;   DIALYSIS/PERMA CATHETER INSERTION Right 04/26/2019   Perm Cath    DIALYSIS/PERMA CATHETER INSERTION N/A 04/26/2019   Procedure: DIALYSIS/PERMA CATHETER INSERTION;  Surgeon: Algernon Huxley, MD;  Location: [place] CV LAB;  Service: Cardiovascular;  Laterality: N/A;   DIALYSIS/PERMA CATHETER REMOVAL N/A 08/15/2019   Procedure: DIALYSIS/PERMA CATHETER REMOVAL;  Surgeon: Katha Cabal, MD;  Location: [place] CV LAB;  Service: Cardiovascular;  Laterality: N/A;   EMBOLIZATION Right 02/06/2021   Procedure: EMBOLIZATION;  Surgeon: Algernon Huxley, MD;  Location: [place] CV LAB;  Service: Cardiovascular;  Laterality: Right;  Right Upper Extremity Dialysis Access, Permcath Placement  ESOPHAGOGASTRODUODENOSCOPY (EGD) WITH PROPOFOL N/A 12/27/2017   Procedure: ESOPHAGOGASTRODUODENOSCOPY (EGD) WITH PROPOFOL;  Surgeon: Toledo, Benay Pike, MD;  Location: [name] ENDOSCOPY;  Service: Gastroenterology;  Laterality: N/A;   INTRAVASCULAR ULTRASOUND/IVUS N/A 02/26/2020   Procedure: Intravascular Ultrasound/IVUS;  Surgeon: Jettie Booze, MD;  Location: [place]  CV LAB;  Service: Cardiovascular;  Laterality: N/A;   LEFT HEART CATH AND CORONARY ANGIOGRAPHY N/A 02/26/2020   Procedure: LEFT HEART CATH AND CORONARY ANGIOGRAPHY;  Surgeon: Jettie Booze, MD;  Location: [place] CV LAB;  Service: Cardiovascular;  Laterality: N/A;   PROSTATE SURGERY  2016   TONSILECTOMY/ADENOIDECTOMY WITH MYRINGOTOMY     TONSILLECTOMY     UPPER EXTREMITY ANGIOGRAPHY Right 11/26/2020   Procedure: Upper Extremity Angiography;  Surgeon: Algernon Huxley, MD;  Location: [place] CV LAB;  Service: Cardiovascular;  Laterality: Right;   UPPER EXTREMITY ANGIOGRAPHY Right 02/05/2021   Procedure: UPPER EXTREMITY ANGIOGRAPHY;  Surgeon: Algernon Huxley, MD;  Location: [place] CV LAB;  Service: Cardiovascular;  Laterality: Right;   Family History:   Family History  Problem Relation Age of Onset   CAD Father    Stroke Father    Diabetes Mellitus II Mother    Kidney failure Mother    Schizophrenia Mother    Social History:  reports that he has never smoked. He has never used smokeless tobacco. He reports that he does not drink alcohol and does not use drugs. Allergies  Allergen Reactions   Levofloxacin Swelling    Of face Other reaction(s): swelling   Promethazine Diarrhea and Other (See Comments)    Other reaction(s): Other (See Comments), Other (See Comments) Other reaction(s): Muscle cramps Muscle cramps Cramping Other reaction(s): Muscle cramps Muscle cramps   Other     Other reaction(s): Unknown   Malt Other (See Comments)    Other reaction(s): cramping   Prior to Admission medications   Medication Sig Start Date End Date Taking? Authorizing Provider  amiodarone (PACERONE) 200 MG tablet Take 1 tablet (200 mg total) by mouth daily 02/10/21  Yes Charlynne Cousins, MD  apixaban (ELIQUIS) 5 MG TABS tablet Take 1 tablet (5 mg total) by mouth 2 (two) times daily. 02/10/21  Yes Charlynne Cousins, MD  artificial tears (LACRILUBE) OINT ophthalmic ointment  Place into the left eye 4 (four) times daily. 02/08/21  Yes Elodia Florence., MD  atorvastatin (LIPITOR) 80 MG tablet Take 1 tablet (80 mg total) by mouth daily. 04/20/21 04/15/22 Yes Loel Dubonnet, NP  carvedilol (COREG) 25 MG tablet Take 1 tablet (25 mg total) by mouth 2 (two) times daily with a meal. 02/10/21  Yes Charlynne Cousins, MD  clopidogrel (PLAVIX) 75 MG tablet Take 1 tablet (75 mg total) by mouth daily. 02/10/21  Yes Charlynne Cousins, MD  dorzolamide-timolol (COSOPT) 22.3-6.8 MG/ML ophthalmic solution Place 1 drop into the right eye 2 (two) times daily.   Yes [provider]  nitroGLYCERIN (NITROSTAT) 0.4 MG SL tablet Place 1 tablet (0.4 mg total) under the tongue every 5 (five) minutes as needed for chest pain. 12/09/20 12/09/21 Yes Johnson, Clanford L, MD  prednisoLONE acetate (PRED FORTE) 1 % ophthalmic suspension Place 1 drop into the left eye every 4 (four) hours.   Yes [provider]  tamsulosin (FLOMAX) 0.4 MG CAPS capsule Take 0.4 mg by mouth daily after breakfast.    Yes [provider]   Current Facility-Administered Medications  Medication Dose Route Frequency Provider Last Rate Last Admin  0.9 %  sodium chloride infusion  250 mL Intravenous PRN Emokpae, Courage, MD       acetaminophen (TYLENOL) tablet 650 mg  650 mg Oral Q6H PRN Roxan Hockey, MD   650 mg at 04/22/21 0840   Or   acetaminophen (TYLENOL) suppository 650 mg  650 mg Rectal Q6H PRN Emokpae, Courage, MD       amiodarone (PACERONE) tablet 200 mg  200 mg Oral Daily Emokpae, Courage, MD   200 mg at 04/22/21 1007   artificial tears (LACRILUBE) ophthalmic ointment   Left Eye QID Roxan Hockey, MD   1 application at 81/27/51 1022   aspirin EC tablet 81 mg  81 mg Oral Q breakfast Emokpae, Courage, MD   81 mg at 04/22/21 0840   atorvastatin (LIPITOR) tablet 80 mg  80 mg Oral Daily Emokpae, Courage, MD   80 mg at 04/22/21 1009   bisacodyl (DULCOLAX) suppository 10 mg  10  mg Rectal Daily PRN Emokpae, Courage, MD       dorzolamide-timolol (COSOPT) 22.3-6.8 MG/ML ophthalmic solution 1 drop  1 drop Right Eye BID Denton Brick, Courage, MD   1 drop at 04/22/21 1013   heparin injection 5,000 Units  5,000 Units Subcutaneous Q8H Emokpae, Courage, MD   5,000 Units at 04/22/21 0524   insulin aspart (novoLOG) injection 0-5 Units  0-5 Units Subcutaneous QHS Emokpae, Courage, MD       insulin aspart (novoLOG) injection 0-6 Units  0-6 Units Subcutaneous TID WC Emokpae, Courage, MD       LORazepam (ATIVAN) injection 0.5 mg  0.5 mg Intravenous Q6H PRN Denton Brick, Courage, MD   0.5 mg at 04/21/21 1445   metoprolol tartrate (LOPRESSOR) tablet 25 mg  25 mg Oral TID Roxan Hockey, MD   25 mg at 04/22/21 1010   multivitamin with minerals tablet 1 tablet  1 tablet Oral Daily Emokpae, Courage, MD   1 tablet at 04/22/21 1008   polyethylene glycol (MIRALAX / GLYCOLAX) packet 17 g  17 g Oral Daily PRN Emokpae, Courage, MD       prednisoLONE acetate (PRED FORTE) 1 % ophthalmic suspension 1 drop  1 drop Left Eye Q4H Emokpae, Courage, MD   1 drop at 04/22/21 1016   sodium chloride flush (NS) 0.9 % injection 3 mL  3 mL Intravenous Q12H Emokpae, Courage, MD       sodium chloride flush (NS) 0.9 % injection 3 mL  3 mL Intravenous Q12H Emokpae, Courage, MD   3 mL at 04/22/21 1033   sodium chloride flush (NS) 0.9 % injection 3 mL  3 mL Intravenous PRN Roxan Hockey, MD       Labs: Basic Metabolic Panel: Recent Labs  Lab 04/21/21 1004 04/22/21 0515  NA 138 136  K 3.7 4.3  CL 103 105  CO2 23 21*  GLUCOSE 91 70  BUN 28* 43*  CREATININE 6.63* 8.77*  CALCIUM 8.5* 8.5*   Liver Function Tests: Recent Labs  Lab 04/21/21 1004  AST 14*  ALT 11  ALKPHOS 80  BILITOT 0.7  PROT 6.9  ALBUMIN 3.3*   No results for input(s): LIPASE, AMYLASE in the last 168 hours. No results for input(s): AMMONIA in the last 168 hours. CBC: Recent Labs  Lab 04/21/21 1004 04/22/21 0515  WBC 6.0 9.3   NEUTROABS 3.8  --   HGB 11.4* 10.7*  HCT 37.5* 36.7*  MCV 79.1* 80.0  PLT 156 156   Cardiac Enzymes: No results for input(s): CKTOTAL, CKMB, CKMBINDEX, TROPONINI  in the last 168 hours. CBG: Recent Labs  Lab 04/21/21 2235 04/22/21 0755  GLUCAP 107* 91   Iron Studies: No results for input(s): IRON, TIBC, TRANSFERRIN, FERRITIN in the last 72 hours. Studies/Results: MR BRAIN WO CONTRAST  Result Date: 04/21/2021 CLINICAL DATA:  Provided history: Neuro deficit, acute, stroke suspected. EXAM: MRI HEAD WITHOUT CONTRAST TECHNIQUE: Multiplanar, multiecho pulse sequences of the brain and surrounding structures were obtained without intravenous contrast. COMPARISON:  CT angiogram head/neck 04/21/2021. Non-contrast head CT 04/21/2021. MRI brain and MRA head 02/13/2021. FINDINGS: Brain: Cerebral volume is normal. There are patchy acute cortically-based infarcts within the bilateral high frontal lobes and right parietal lobe (predominantly within a watershed distribution). A subcentimeter acute infarct is also present within the callosal splenium on the left. Mild multifocal T2 FLAIR hyperintense signal abnormality within the cerebral white matter and pons, nonspecific but compatible chronic small vessel ischemic disease. Redemonstrated chronic lacunar infarct within the right thalamus. No evidence of an intracranial mass. No chronic intracranial blood products. No extra-axial fluid collection. No midline shift. Vascular: Maintained flow voids within the proximal large arterial vessels. Skull and upper cervical spine: No focal suspicious marrow lesion. Incompletely assessed cervical spondylosis. Sinuses/Orbits: Visualized orbits show no acute finding. Left globe prosthesis. Prior right ocular lens replacement. Mild mucosal thickening within the bilateral ethmoid, sphenoid and maxillary sinuses. IMPRESSION: Patchy acute cortically-based infarcts within the bilateral high frontal lobes and right parietal lobe  (predominantly in a watershed distribution). Subcentimeter acute infarct within the callosal splenium on the left. Mild chronic small vessel ischemic changes within the cerebral white matter and pons, similar as compared to the prior brain MRI of 02/13/2021. Redemonstrated chronic lacunar infarct within the right thalamus. Mild mucosal thickening within the bilateral ethmoid, sphenoid and maxillary sinuses. Electronically Signed   By: Kellie Simmering D.O.   On: 04/21/2021 12:41   CT HEAD CODE STROKE WO CONTRAST  Result Date: 04/21/2021 CLINICAL DATA:  Code stroke. Neuro deficit, acute, stroke suspected. Sudden onset left-sided arm weakness and drift. EXAM: CT HEAD WITHOUT CONTRAST TECHNIQUE: Contiguous axial images were obtained from the base of the skull through the vertex without intravenous contrast. RADIATION DOSE REDUCTION: This exam was performed according to the departmental dose-optimization program which includes automated exposure control, adjustment of the mA and/or kV according to patient size and/or use of iterative reconstruction technique. COMPARISON:  Head CT 02/12/2021 and MRI 02/13/2021 FINDINGS: Brain: There is no evidence of an acute infarct, intracranial hemorrhage, mass, midline shift, or extra-axial fluid collection. There is mild cerebral atrophy. Hypodensities in the cerebral white matter bilaterally are unchanged and nonspecific but compatible with mild chronic small vessel ischemic disease. Vascular: Calcified atherosclerosis at the skull base. No hyperdense vessel. Skull: No fracture or suspicious osseous lesion. Sinuses/Orbits: Visualized paranasal sinuses and mastoid air cells are clear. Right cataract extraction. Left ocular prosthesis. Other: None. ASPECTS [facility] Stroke Program Early CT Score) - Ganglionic level infarction (caudate, lentiform nuclei, internal capsule, insula, M1-M3 cortex): 7 - Supraganglionic infarction (M4-M6 cortex): 3 Total score (0-10 with 10 being normal): 10  IMPRESSION: 1. No evidence of acute intracranial abnormality. ASPECTS of 10. 2. Mild chronic small vessel ischemic disease. These results were called by telephone at the time of interpretation on 04/21/2021 at 10:20 am to Dr. Varney Biles, who verbally acknowledged these results. Electronically Signed   By: Logan Bores M.D.   On: 04/21/2021 10:22   CT ANGIO HEAD NECK W WO CM (CODE STROKE)  Result Date: 04/21/2021 CLINICAL DATA:  Neuro deficit, acute, stroke suspected. Left-sided weakness. EXAM: CT ANGIOGRAPHY HEAD AND NECK TECHNIQUE: Multidetector CT imaging of the head and neck was performed using the standard protocol during bolus administration of intravenous contrast. Multiplanar CT image reconstructions and MIPs were obtained to evaluate the vascular anatomy. Carotid stenosis measurements (when applicable) are obtained utilizing NASCET criteria, using the distal internal carotid diameter as the denominator. RADIATION DOSE REDUCTION: This exam was performed according to the departmental dose-optimization program which includes automated exposure control, adjustment of the mA and/or kV according to patient size and/or use of iterative reconstruction technique. CONTRAST:  63mL OMNIPAQUE IOHEXOL 350 MG/ML SOLN COMPARISON:  Head MRA 02/13/2021. Carotid Doppler ultrasound 02/13/2021. FINDINGS: CTA NECK FINDINGS Aortic arch: Standard 3 vessel aortic arch. Calcified plaque at the left subclavian artery origin without evidence associated significant stenosis. Right carotid system: Patent with minimal plaque in the distal common carotid artery. No evidence of a significant stenosis or dissection. Left carotid system: Patent with minimal plaque at the carotid bifurcation. No evidence of a significant stenosis or dissection. Vertebral arteries: Patent with the right being moderately dominant. No definite evidence of a flow limiting stenosis or dissection although poor opacification limits assessment. Skeleton:  Focally advanced disc degeneration at C3-4, likely with moderate spinal stenosis and moderate left neural foraminal stenosis. Other neck: No evidence of cervical lymphadenopathy or mass. Upper chest: Clear lung apices. Review of the MIP images confirms the above findings CTA HEAD FINDINGS Assessment is limited by poor arterial opacification, with largely nondiagnostic evaluation of the small and medium-sized arteries. Anterior circulation: The intracranial internal carotid arteries are patent with mild atherosclerotic plaque bilaterally. The MCAs are patent through the bifurcations with poor visualization of the branch vessels. The ACAs are also grossly patent proximally. No sizable aneurysm is identified. Posterior circulation: The intracranial vertebral arteries are patent to the basilar with right greater than left V4 segment atherosclerosis. The basilar artery is patent, and the proximal PCAs are grossly patent. No sizable aneurysm is identified. Venous sinuses: Patent. Anatomic variants: None. Review of the MIP images confirms the above findings IMPRESSION: Limited study due to poor arterial opacification as detailed above. No evidence of a large vessel occlusion through the level of the MCA bifurcations. These results were called by telephone at the time of interpretation on 04/21/2021 at 10:20 am to Dr. Varney Biles, who verbally acknowledged these results. The study was also later discussed by telephone with Dr. Quinn Axe. Electronically Signed   By: Logan Bores M.D.   On: 04/21/2021 10:40    ROS: Pertinent items are noted in HPI. Difficulty with speech Physical Exam: Vitals:   04/22/21 0212 04/22/21 0500 04/22/21 0550 04/22/21 1010  BP: (!) 193/93  (!) 166/76 122/78  Pulse: 66  64 91  Resp: 17  18   Temp: 98.9 F (37.2 C)  98.5 F (36.9 C)   TempSrc: Oral  Oral   SpO2: 99%  98%   Weight:  82.4 kg    Height:          Weight change:   Intake/Output Summary (Last 24 hours) at 04/22/2021  1128 Last data filed at 04/22/2021 1033 Gross per 24 hour  Intake 3 ml  Output --  Net 3 ml   BP 122/78    Pulse 91    Temp 98.5 F (36.9 C) (Oral)    Resp 18    Ht 6\' 3"  (1.905 m)    Wt 82.4 kg    SpO2 98%  BMI 22.71 kg/m  General appearance: alert, cooperative, and no distress Head: Normocephalic, without obvious abnormality, atraumatic Resp: clear to auscultation bilaterally Cardio: irregularly irregular rhythm GI: soft, non-tender; bowel sounds normal; no masses,  no organomegaly Extremities: extremities normal, atraumatic, no cyanosis or edema and RUE AVF pulsatile Dialysis Access:  Dialysis Orders: Center: [facility]  on TTS . EDW 87 kg HD Bath 2K/2.5Ca  Time 3:30 Heparin . Access Left [place] catheter, RUE AVF 1800 units IVP then 400 units/hr BFR 400 DFR 800    Micera 150 mcg IVP every 2 weeks, Venofer 50 mg IV TTS, calcitriol 0.5 mcg TTS  Assessment/Plan:  Acute bilateral watershed stroke - MRA without obstruction of vessels.  Likely due to hemodynamic changes with HD as he and his wife report frequent drops of BP with HD, however after discussion with his home unit, he did not have any drop in BP during HD and had syncope after HD was completed.  Neuro following. Discussed possible need for transition to PD given risk of recurrence.  ESRD -  plan for HD tomorrow if still here.  Cannot dose with midodrine before HD due to prolonged QT interval.  Discussed with he and his wife about possible transition to PD as above.  No heparin with HD  Hypertension/volume  - stable  Anemia  - stable  Metabolic bone disease -  noncompliant with meds. Resume binders  Nutrition - renal diet, carb modified.  Donetta Potts, MD [place]  04/22/2021, 11:28 AM

## 2021-04-22 NOTE — Telephone Encounter (Signed)
Left message for patient to call and discuss rescheduling the [facility] ordered by Dr. Irish Lack ?

## 2021-04-22 NOTE — Progress Notes (Signed)
Patient requested to try standing. This nurse and CNA assisted with the help of a Gait belt and walker to stand patient. He was able to stand. Noticed a little swaying and assisted patient to sit back down in bed. Patient stated " that felt so much better, to stand and get up out the bed." ?

## 2021-04-22 NOTE — Progress Notes (Signed)
? ?  Nephrology Nursing Note: ? ? ?Pt is HBV immune per serologies obtained from [facility]): ? ?Infection/Serology ?Draw Date Description Result/Unit Interpretation Ref Range  ?10 Mar 2021  Hepatitis B virus surface OE:CXFQ:HK:UVJ/DYNX:GZ:FP:  48 mIU/mL    0 - 9   ? ? ? ?Rockwell Alexandria, RN ?

## 2021-04-22 NOTE — Evaluation (Signed)
Physical Therapy Evaluation Patient Details Name: [name]. MRN: [id number] DOB: 08-05-1970 Today's Date: 04/22/2021  History of Present Illness  [name]  is a 51 y.o. 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 Fib, DChF, who presents from hemodialysis center with sudden onset of left-sided weakness   Clinical Impression  Patient demonstrates slow labored movement for sitting up at bedside with limited use of LUE due to weakness, unable to stand without AD or using SPC due to weakness, able to grip with left and used RW for completing sit to stands.  Patient  very unsteady on feet with buckling of knees and spastic like movement of right side and limited to a few steps before having to sit due to fall risk.  Patient tolerated sitting up in chair after therapy with his spouse present in room.  Patient will benefit from continued skilled physical therapy in hospital and recommended venue below to increase strength, balance, endurance for safe ADLs and gait.          Recommendations for follow up therapy are one component of a multi-disciplinary discharge planning process, led by the attending physician.  Recommendations may be updated based on patient status, additional functional criteria and insurance authorization.  Follow Up Recommendations Acute inpatient rehab (3hours/day)    Assistance Recommended at Discharge Set up Supervision/Assistance  Patient can return home with the following  A lot of help with walking and/or transfers;A lot of help with bathing/dressing/bathroom;Help with stairs or ramp for entrance;Assistance with cooking/housework    Equipment Recommendations    Recommendations for Other Services       Functional Status Assessment Patient has had a recent decline in their functional status and demonstrates  the ability to make significant improvements in function in a reasonable and predictable amount of time.     Precautions / Restrictions Precautions Precautions: Fall Restrictions Weight Bearing Restrictions: No      Mobility  Bed Mobility               General bed mobility comments: as per OT notes    Transfers                   General transfer comment: as per OT notes    Ambulation/Gait Ambulation/Gait assistance: Mod assist, Max assist Gait Distance (Feet): 6 Feet Assistive device: Rolling walker (2 wheels) Gait Pattern/deviations: Decreased step length - right, Decreased step length - left, Decreased stride length, Decreased stance time - left Gait velocity: decreased     General Gait Details: limited to a few slow labored side steps with frequent buckling of knees, left weaker than right due to poor standing balance and fatigue  Stairs            Wheelchair Mobility    Modified Rankin (Stroke Patients Only)       Balance Overall balance assessment: Needs assistance Sitting-balance support: Feet supported, No upper extremity supported Sitting balance-Leahy Scale: Fair Sitting balance - Comments: seated EOB   Standing balance support: During functional activity, No upper extremity supported Standing balance-Leahy Scale: Poor Standing balance comment: poor using RW, unable to stand using [facility]                             Pertinent Vitals/Pain Pain Assessment Pain Assessment: No/denies pain  Home Living Family/patient expects to be discharged to:: Private residence Living Arrangements: Spouse/significant other Available Help at Discharge: Family;Available PRN/intermittently Type of Home: House Home Access: Ramped entrance       Home Layout: One level Home Equipment: Conservation officer, nature (2 wheels);Other (comment);BSC/3in1 Additional Comments: Pt reported no change in prior living set up since last admission.    Prior  Function Prior Level of Function : Needs assist       Physical Assist : Mobility (physical);ADLs (physical) Mobility (physical): Bed mobility;Transfers;Gait;Stairs   Mobility Comments: Pt ambulates with cane in community. No AD use within house. ADLs Comments: Pt reportedly is indepdnent for ADL's and IADL's at baseline.     Hand Dominance   Dominant Hand: Left    Extremity/Trunk Assessment   Upper Extremity Assessment Upper Extremity Assessment: Defer to OT evaluation LUE Deficits / Details: Shoulder flexion and abduction0/5 MMT; elbow felxion and extension 0/5 MMT. Wrist flexion and extension 2-/5 MMT; 4/5 grip strength. WFL P/ROM of shoulder flexion and abduction. Minimal to moderate tone with elbow flexion and extension. Able to complete sequential finger touching with good precision if L UE held in position to do so. Mild difficulty opening deodorant container. Despite formal MMT testing results pt was observed to bear weight through L UE at times when seated at EOB. LUE Sensation: WNL LUE Coordination: decreased fine motor;decreased gross motor    Lower Extremity Assessment Lower Extremity Assessment: Generalized weakness;LLE deficits/detail;RLE deficits/detail RLE Deficits / Details: grossly 4/5 RLE Sensation: WNL RLE Coordination: WNL LLE Deficits / Details: grossly 3+/5 except ankle dorsiflexors 3-/5 LLE Sensation: decreased light touch LLE Coordination: decreased gross motor    Cervical / Trunk Assessment Cervical / Trunk Assessment: Normal  Communication   Communication: No difficulties  Cognition Arousal/Alertness: Awake/alert Behavior During Therapy: WFL for tasks assessed/performed Overall Cognitive Status: Within Functional Limits for tasks assessed                                          General Comments      Exercises     Assessment/Plan    PT Assessment Patient needs continued PT services  PT Problem List Decreased  strength;Decreased activity tolerance;Decreased balance;Decreased mobility       PT Treatment Interventions DME instruction;Gait training;Stair training;Functional mobility training;Therapeutic activities;Therapeutic exercise;Balance training;Patient/family education    PT Goals (Current goals can be found in the Care Plan section)  Acute Rehab PT Goals Patient Stated Goal: return home after rehab PT Goal Formulation: With patient/family Time For Goal Achievement: 05/06/21 Potential to Achieve Goals: Good    Frequency Min 5X/week     Co-evaluation PT/OT/SLP Co-Evaluation/Treatment: Yes Reason for Co-Treatment: Complexity of the patient's impairments (multi-system involvement);To address functional/ADL transfers PT goals addressed during session: Mobility/safety with mobility;Balance;Proper use of DME OT goals addressed during session: ADL's and self-care;Strengthening/ROM       AM-PAC PT "6 Clicks" Mobility  Outcome Measure Help needed turning from your back to your side while in a flat bed without using bedrails?: A Lot Help needed moving from lying on your back to sitting on the side of a flat bed without using bedrails?: A Lot Help needed moving to and from a bed to a chair (including a wheelchair)?: A Lot Help needed standing up from a chair using your arms (e.g., wheelchair or bedside chair)?: A Lot Help needed to walk in hospital room?: A Lot Help  needed climbing 3-5 steps with a railing? : Total 6 Click Score: 11    End of Session   Activity Tolerance: Patient tolerated treatment well;Patient limited by fatigue Patient left: in chair;with call bell/phone within reach;with family/visitor present Nurse Communication: Mobility status PT Visit Diagnosis: Unsteadiness on feet (R26.81);Other abnormalities of gait and mobility (R26.89);Muscle weakness (generalized) (M62.81)    Time: 2527-1292 PT Time Calculation (min) (ACUTE ONLY): 30 min   Charges:   PT Evaluation $PT  Eval Moderate Complexity: 1 Mod PT Treatments $Therapeutic Activity: 23-37 mins        12:31 PM, 04/22/21 Lonell Grandchild, MPT Physical Therapist with [facility] [phone] office [phone] mobile phone

## 2021-04-22 NOTE — Evaluation (Signed)
Occupational Therapy Evaluation Patient Details Name: [name]. MRN: [id number] DOB: Dec 23, 1970 Today's Date: 04/22/2021   History of Present Illness Shayan Bakken  is a 51 y.o. 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 Fib, DChF, who presents from hemodialysis center with sudden onset of left-sided weakness   Clinical Impression   Pt agreeable to OT and PT co-evaluation. Pt reports independence at baseline with household ambulation without AD and cane for community ambulation. This date pt presents with very limited functional use of L UE with wrist and digit movement but little to no movement of shoulder and elbow other than bearing weight during sitting at EOB. Pt able to sit at EOB with fair balance with one instance of L Lateral loss of balance needing assist to return to midline. Pt required mod to max A for stand pivot transfer with poor balance and need for assist to place L UE on RW. Pt does show functional grip strength and partial wrist range of motion.  WFL P/ROM of L UE but with tone most notably with elbow P/ROM. Pt was left in chair with call bell within reach and family present. Pt will benefit from continued OT in the hospital and recommended venue below to increase strength, balance, and endurance for safe ADL's.      Recommendations for follow up therapy are one component of a multi-disciplinary discharge planning process, led by the attending physician.  Recommendations may be updated based on patient status, additional functional criteria and insurance authorization.   Follow Up Recommendations  Acute inpatient rehab (3hours/day)    Assistance Recommended at Discharge Intermittent Supervision/Assistance  Patient can return home with the following A lot of help with walking and/or transfers;A lot of help with  bathing/dressing/bathroom;Assistance with cooking/housework;Assistance with feeding;Assist for transportation;Help with stairs or ramp for entrance    Functional Status Assessment  Patient has had a recent decline in their functional status and demonstrates the ability to make significant improvements in function in a reasonable and predictable amount of time.  Equipment Recommendations  None recommended by OT    Recommendations for Other Services       Precautions / Restrictions Precautions Precautions: Fall Restrictions Weight Bearing Restrictions: No      Mobility Bed Mobility Overal bed mobility: Needs Assistance Bed Mobility: Supine to Sit     Supine to sit: Mod assist     General bed mobility comments: Slow labored movement with assist to move B LE and pull to sit.    Transfers Overall transfer level: Needs assistance Equipment used: Rolling walker (2 wheels) Transfers: Sit to/from Stand, Bed to chair/wheelchair/BSC Sit to Stand: Mod assist     Step pivot transfers: Mod assist, Max assist     General transfer comment: Pt unsteady in standing difficulty stepping to L side with noted L LE weakness and muscle spasms with intientional movement during transfer. Able to place L UE on RW with assist.      Balance Overall balance assessment: Needs assistance Sitting-balance support: No upper extremity supported, Feet supported Sitting balance-Leahy Scale: Fair Sitting balance - Comments: seated EOB Postural control: Left lateral lean (Noted to lose balance to L side at one point during sitting at EOB.) Standing balance support: During functional activity, Reliant on assistive device for balance, Bilateral upper extremity supported Standing balance-Leahy Scale: Poor Standing balance comment: using RW  ADL either performed or assessed with clinical judgement   ADL Overall ADL's : Needs assistance/impaired Eating/Feeding:  Sitting;Minimal assistance;Set up   Grooming: Sitting;Moderate assistance   Upper Body Bathing: Moderate assistance;Sitting   Lower Body Bathing: Moderate assistance;Maximal assistance;Sitting/lateral leans   Upper Body Dressing : Moderate assistance;Sitting   Lower Body Dressing: Moderate assistance;Maximal assistance;Sitting/lateral leans   Toilet Transfer: Maximal assistance;Rolling walker (2 wheels);Stand-pivot Armed forces technical officer Details (indicate cue type and reason): Simulated via EOB to chair with pt noting to have muscle spasms and poor balance in standing. Toileting- Clothing Manipulation and Hygiene: Maximal assistance;Total assistance;Bed level   Tub/ Shower Transfer: Maximal assistance;Stand-pivot;Tub bench   Functional mobility during ADLs: Maximal assistance;Rolling walker (2 wheels);+2 for physical assistance General ADL Comments: Pt limited by L side weakness and poor balance in standing. Pt limited in ADL's due to minimal functional use of L UE.     Vision Baseline Vision/History: 2 Legally blind (Missing L eye) Ability to See in Adequate Light: 2 Moderately impaired Patient Visual Report: No change from baseline Vision Assessment?: No apparent visual deficits Additional Comments: Other than baseline deficits.                Pertinent Vitals/Pain Pain Assessment Pain Assessment: No/denies pain     Hand Dominance Left   Extremity/Trunk Assessment Upper Extremity Assessment Upper Extremity Assessment: LUE deficits/detail (R UE WFL for tasks assessed. Noted to be missing R 4th digit.) LUE Deficits / Details: Shoulder flexion and abduction0/5 MMT; elbow felxion and extension 0/5 MMT. Wrist flexion and extension 2-/5 MMT; 4/5 grip strength. WFL P/ROM of shoulder flexion and abduction. Minimal to moderate tone with elbow flexion and extension. Able to complete sequential finger touching with good precision if L UE held in position to do so. Mild difficulty opening  deodorant container. Despite formal MMT testing results pt was observed to bear weight through L UE at times when seated at EOB. LUE Sensation: WNL LUE Coordination: decreased fine motor;decreased gross motor   Lower Extremity Assessment Lower Extremity Assessment: Defer to PT evaluation   Cervical / Trunk Assessment Cervical / Trunk Assessment: Normal   Communication Communication Communication: No difficulties   Cognition Arousal/Alertness: Awake/alert Behavior During Therapy: WFL for tasks assessed/performed Overall Cognitive Status: Within Functional Limits for tasks assessed                                                        Home Living Family/patient expects to be discharged to:: Private residence Living Arrangements: Spouse/significant other Available Help at Discharge: Family;Available PRN/intermittently Type of Home: House Home Access: Ramped entrance     Home Layout: One level     Bathroom Shower/Tub: Hospital doctor Toilet: Handicapped height     Home Equipment: Conservation officer, nature (2 wheels);Other (comment);BSC/3in1   Additional Comments: Pt reported no change in prior living set up since last admission.      Prior Functioning/Environment Prior Level of Function : Needs assist             Mobility Comments: Pt ambulates with cane in community. No AD use within house. ADLs Comments: Pt reportedly is indepdnent for ADL's and IADL's at baseline.        OT Problem List: Decreased strength;Decreased range of motion;Decreased activity tolerance;Impaired balance (sitting and/or standing);Decreased coordination;Impaired tone;Impaired UE functional use  OT Treatment/Interventions: Self-care/ADL training;Therapeutic exercise;Therapeutic activities;Neuromuscular education;DME and/or AE instruction;Patient/family education;Balance training;Manual therapy    OT Goals(Current goals can be found in the care plan section)  Acute Rehab OT Goals Patient Stated Goal: Pt and familty open to rehab to improve function. OT Goal Formulation: With patient/family Time For Goal Achievement: 05/06/21 Potential to Achieve Goals: Fair  OT Frequency: Min 2X/week    Co-evaluation PT/OT/SLP Co-Evaluation/Treatment: Yes Reason for Co-Treatment: Complexity of the patient's impairments (multi-system involvement)   OT goals addressed during session: ADL's and self-care;Strengthening/ROM      AM-PAC OT "6 Clicks" Daily Activity     Outcome Measure Help from another person eating meals?: A Little Help from another person taking care of personal grooming?: A Lot Help from another person toileting, which includes using toliet, bedpan, or urinal?: Total Help from another person bathing (including washing, rinsing, drying)?: A Lot Help from another person to put on and taking off regular upper body clothing?: A Lot Help from another person to put on and taking off regular lower body clothing?: A Lot 6 Click Score: 12   End of Session Equipment Utilized During Treatment: Rolling walker (2 wheels)  Activity Tolerance: Patient tolerated treatment well Patient left: in chair;with call bell/phone within reach;with family/visitor present  OT Visit Diagnosis: Unsteadiness on feet (R26.81);Other abnormalities of gait and mobility (R26.89);Muscle weakness (generalized) (M62.81);History of falling (Z91.81);Other symptoms and signs involving the nervous system (R29.898);Hemiplegia and hemiparesis Hemiplegia - Right/Left: Left Hemiplegia - dominant/non-dominant: Dominant Hemiplegia - caused by: Cerebral infarction                Time: 6962-9528 OT Time Calculation (min): 25 min Charges:  OT General Charges $OT Visit: 1 Visit OT Evaluation $OT Eval Moderate Complexity: 1 Mod  Char Feltman OT, MOT  Larey Seat 04/22/2021, 9:57 AM

## 2021-04-22 NOTE — PMR Pre-admission (Signed)
PMR Admission Coordinator Pre-Admission Assessment  Patient: [name]. is an 51 y.o., male MRN: [id number] DOB: [birth year] Height: 6' 3"  (190.5 cm) Weight: 82.4 kg  Insurance Information HMO: yes    PPO:      PCP:      IPA:      80/20:      OTHER:  PRIMARY: [employer]      Policy#: [id number]      Subscriber: pt CM Name: Dot M.      Phone#: [phone] ext [phone]     Fax#: [phone] Pre-Cert#: [id number] auth for CIR from Dot M. With updates due to [facility]. At fax listed above on 3/9 (ext [phone])      Employer:  Benefits:  Phone #: [phone]      Name:  Eff. Date: 02/22/21     Deduct: $0      Out of Pocket Max: $3400 (met $956.30)      Life Max: n/a CIR: $295/day for days 1-6      SNF: 20 full days Outpatient:      Co-Pay: $10-$20/visit Home Health: 100%      Co-Pay:  DME: 80%     Co-Ins: 20% Providers:  SECONDARY:       Policy#:      Phone#:   Development worker, community:       Phone#:   The Actuary for patients in Inpatient Rehabilitation Facilities with attached Privacy Act [place] Records was provided and verbally reviewed with: Patient and Family  Emergency Contact Information Contact Information     Name Relation Home Work Mobile   [place] Spouse [phone]  [phone]   Alda Berthold Relative [phone]  [phone]       Current Medical History  Patient Admitting Diagnosis: CVA  History of Present Illness: Pt is a 51 y.o. male with a past medical history significant for recurrent CVA, chronic CHF, DM 2, HTN, left eye inoculation, A-fib on Eliquis, ESRD on hemodialysis TTS, who presented on 04/21/21 after hemodialysis where he had a syncopal episode and woke up with the left upper extremity and lower extremity weakness.  Upon evaluation patient was hypertensive with a blood pressure of 193/95 allegedly during hemodialysis he becomes hypotensive.  MRI of the brain revealed bilateral acute,  cortical-based infarcts with bilateral high frontal lobe and right parietal lobe-predominantly in a watershed distribution Apparently he has had a similar episode in December with dialysis. MRI at that time revealed acute left hippocampal stroke.  CTA head/neck without LVO.  F/U CT head on 3/3 revealed no acute bleeding.  Neurology recommended Eliquis and plavix be restarted.  Home meds (amiodarone and beta-blockers) restarted.  Therapy evaluations were complete and pt was recommended for CIR.    Complete NIHSS TOTAL: 2  [facility]'s [facility] record from [facility] has been reviewed by the rehabilitation admission coordinator and physician.  Past Medical History  Past Medical History:  Diagnosis Date   Anxiety    Cellulitis    CHF (congestive heart failure) (HCC)    Chronic diarrhea    Chronic kidney disease    Chronic pain of both ankles    Diabetes mellitus without complication (HCC)    Diabetes mellitus, type II (HCC)    Diabetic neuropathy (HCC)    Frequent falls    Gait instability    GERD (gastroesophageal reflux disease)    Heart palpitations    Hemodialysis patient ([place])    History of kidney stones    HLD (hyperlipidemia)  Hypertension    Insomnia    Nausea and vomiting in adult    recurrent    Has the patient had major surgery during 100 days prior to admission? Yes  Family History   family history includes CAD in his father; Diabetes Mellitus II in his mother; Kidney failure in his mother; Schizophrenia in his mother; Stroke in his father.  Current Medications  Current Facility-Administered Medications:    0.9 %  sodium chloride infusion, 250 mL, Intravenous, PRN, Denton Brick, Courage, MD   acetaminophen (TYLENOL) tablet 650 mg, 650 mg, Oral, Q6H PRN, 650 mg at 04/22/21 0840 **OR** acetaminophen (TYLENOL) suppository 650 mg, 650 mg, Rectal, Q6H PRN, Denton Brick, Courage, MD   amiodarone (PACERONE) tablet 200 mg, 200 mg, Oral, Daily, Emokpae, Courage, MD, 200 mg at 04/22/21  1007   artificial tears (LACRILUBE) ophthalmic ointment, , Left Eye, QID, Emokpae, Courage, MD, 1 application at 18/84/16 1022   aspirin EC tablet 81 mg, 81 mg, Oral, Q breakfast, Emokpae, Courage, MD, 81 mg at 04/22/21 0840   atorvastatin (LIPITOR) tablet 80 mg, 80 mg, Oral, Daily, Emokpae, Courage, MD, 80 mg at 04/22/21 1009   bisacodyl (DULCOLAX) suppository 10 mg, 10 mg, Rectal, Daily PRN, Roxan Hockey, MD   [START ON 04/23/2021] Chlorhexidine Gluconate Cloth 2 % PADS 6 each, 6 each, Topical, Q0600, Donato Heinz, MD   dorzolamide-timolol (COSOPT) 22.3-6.8 MG/ML ophthalmic solution 1 drop, 1 drop, Right Eye, BID, Emokpae, Courage, MD, 1 drop at 04/22/21 1013   heparin injection 5,000 Units, 5,000 Units, Subcutaneous, Q8H, Emokpae, Courage, MD, 5,000 Units at 04/22/21 1312   insulin aspart (novoLOG) injection 0-5 Units, 0-5 Units, Subcutaneous, QHS, Emokpae, Courage, MD   insulin aspart (novoLOG) injection 0-6 Units, 0-6 Units, Subcutaneous, TID WC, Emokpae, Courage, MD   LORazepam (ATIVAN) injection 0.5 mg, 0.5 mg, Intravenous, Q6H PRN, Denton Brick, Courage, MD, 0.5 mg at 04/21/21 1445   metoprolol tartrate (LOPRESSOR) tablet 25 mg, 25 mg, Oral, TID, Emokpae, Courage, MD, 25 mg at 04/22/21 1010   multivitamin with minerals tablet 1 tablet, 1 tablet, Oral, Daily, Emokpae, Courage, MD, 1 tablet at 04/22/21 1008   polyethylene glycol (MIRALAX / GLYCOLAX) packet 17 g, 17 g, Oral, Daily PRN, Emokpae, Courage, MD   prednisoLONE acetate (PRED FORTE) 1 % ophthalmic suspension 1 drop, 1 drop, Left Eye, Q4H, Emokpae, Courage, MD, 1 drop at 04/22/21 1016   sodium chloride flush (NS) 0.9 % injection 3 mL, 3 mL, Intravenous, Q12H, Emokpae, Courage, MD   sodium chloride flush (NS) 0.9 % injection 3 mL, 3 mL, Intravenous, Q12H, Emokpae, Courage, MD, 3 mL at 04/22/21 1033   sodium chloride flush (NS) 0.9 % injection 3 mL, 3 mL, Intravenous, PRN, Denton Brick, Courage, MD  Patients Current Diet:  Diet Order              Diet renal/carb modified with fluid restriction Diet-HS Snack? Nothing; Fluid restriction: 1200 mL Fluid; Room service appropriate? Yes; Fluid consistency: Thin  Diet effective now                   Precautions / Restrictions Precautions Precautions: Fall Restrictions Weight Bearing Restrictions: No   Has the patient had 2 or more falls or a fall with injury in the past year? Yes  Prior Activity Level Limited Community (1-2x/wk): retired, went out for HD TRS, cane in the community, not driving  Prior Functional Level Self Care: Did the patient need help bathing, dressing, using the toilet or eating? Independent  Indoor Mobility:  Did the patient need assistance with walking from room to room (with or without device)? Independent  Stairs: Did the patient need assistance with internal or external stairs (with or without device)? Independent  Functional Cognition: Did the patient need help planning regular tasks such as shopping or remembering to take medications? Independent  Patient Information Are you of Hispanic, Latino/a,or Spanish origin?: A. No, not of Hispanic, Latino/a, or Spanish origin What is your race?: B. Black or African American Do you need or want an interpreter to communicate with a doctor or health care staff?: 0. No  Patient's Response To:  Health Literacy and Transportation Is the patient able to respond to health literacy and transportation needs?: Yes Health Literacy - How often do you need to have someone help you when you read instructions, pamphlets, or other written material from your doctor or pharmacy?: Never In the past 12 months, has lack of transportation kept you from medical appointments or from getting medications?: Yes In the past 12 months, has lack of transportation kept you from meetings, work, or from getting things needed for daily living?: Yes  [place] / [place] Devices/Equipment: Radio producer  (specify quad or straight), CBG Meter Home Equipment: Brice (2 wheels), Other (comment), BSC/3in1  Prior Device Use: Indicate devices/aids used by the patient prior to current illness, exacerbation or injury?  Cane in community  Current Functional Level Cognition  Overall Cognitive Status: Within Functional Limits for tasks assessed Orientation Level: Oriented X4    Extremity Assessment (includes Sensation/Coordination)  Upper Extremity Assessment: Defer to OT evaluation LUE Deficits / Details: Shoulder flexion and abduction0/5 MMT; elbow felxion and extension 0/5 MMT. Wrist flexion and extension 2-/5 MMT; 4/5 grip strength. WFL P/ROM of shoulder flexion and abduction. Minimal to moderate tone with elbow flexion and extension. Able to complete sequential finger touching with good precision if L UE held in position to do so. Mild difficulty opening deodorant container. Despite formal MMT testing results pt was observed to bear weight through L UE at times when seated at EOB. LUE Sensation: WNL LUE Coordination: decreased fine motor, decreased gross motor  Lower Extremity Assessment: Generalized weakness, LLE deficits/detail, RLE deficits/detail RLE Deficits / Details: grossly 4/5 RLE Sensation: WNL RLE Coordination: WNL LLE Deficits / Details: grossly 3+/5 except ankle dorsiflexors 3-/5 LLE Sensation: decreased light touch LLE Coordination: decreased gross motor    ADLs  Overall ADL's : Needs assistance/impaired Eating/Feeding: Sitting, Minimal assistance, Set up Grooming: Sitting, Moderate assistance Upper Body Bathing: Moderate assistance, Sitting Lower Body Bathing: Moderate assistance, Maximal assistance, Sitting/lateral leans Upper Body Dressing : Moderate assistance, Sitting Lower Body Dressing: Moderate assistance, Maximal assistance, Sitting/lateral leans Toilet Transfer: Maximal assistance, Rolling walker (2 wheels), Stand-pivot Toilet Transfer Details (indicate cue  type and reason): Simulated via EOB to chair with pt noting to have muscle spasms and poor balance in standing. Toileting- Clothing Manipulation and Hygiene: Maximal assistance, Total assistance, Bed level Tub/ Shower Transfer: Maximal assistance, Stand-pivot, Tub bench Functional mobility during ADLs: Maximal assistance, Rolling walker (2 wheels), +2 for physical assistance General ADL Comments: Pt limited by L side weakness and poor balance in standing. Pt limited in ADL's due to minimal functional use of L UE.    Mobility  Overal bed mobility: Needs Assistance Bed Mobility: Supine to Sit Supine to sit: Mod assist General bed mobility comments: as per OT notes    Transfers  Overall transfer level: Needs assistance Equipment used: Rolling walker (2 wheels) Transfers: Sit to/from  Stand, Bed to chair/wheelchair/BSC Sit to Stand: Mod assist Bed to/from chair/wheelchair/BSC transfer type:: Step pivot Step pivot transfers: Mod assist, Max assist General transfer comment: as per OT notes    Ambulation / Gait / Stairs / Wheelchair Mobility  Ambulation/Gait Ambulation/Gait assistance: Mod assist, Max assist Gait Distance (Feet): 6 Feet Assistive device: Rolling walker (2 wheels) Gait Pattern/deviations: Decreased step length - right, Decreased step length - left, Decreased stride length, Decreased stance time - left General Gait Details: limited to a few slow labored side steps with frequent buckling of knees, left weaker than right due to poor standing balance and fatigue Gait velocity: decreased    Posture / Balance Dynamic Sitting Balance Sitting balance - Comments: seated EOB Balance Overall balance assessment: Needs assistance Sitting-balance support: Feet supported, No upper extremity supported Sitting balance-Leahy Scale: Fair Sitting balance - Comments: seated EOB Postural control: Left lateral lean (Noted to lose balance to L side at one point during sitting at EOB.) Standing  balance support: During functional activity, No upper extremity supported Standing balance-Leahy Scale: Poor Standing balance comment: poor using RW, unable to stand using [facility]    Special needs/care consideration Dialysis: Hemodialysis Tuesday, Thursday, and Saturday and Diabetic management yes   Previous Home Environment (from acute therapy documentation) Living Arrangements: Spouse/significant other Available Help at Discharge: Family, Available PRN/intermittently Type of Home: House Home Layout: One level Home Access: Ramped entrance Bathroom Shower/Tub: Multimedia programmer: Handicapped height Bathroom Accessibility: Yes [place]: No Additional Comments: Pt reported no change in prior living set up since last admission.  Discharge Living Setting Plans for Discharge Living Setting: Patient's home, Lives with (comment) (spouse) Type of Home at Discharge: House Discharge Home Layout: One level Discharge Home Access: [place] entrance Discharge Bathroom Shower/Tub: Walk-in shower Discharge Bathroom Toilet: Handicapped height Discharge Bathroom Accessibility: Yes How Accessible: Accessible via walker Does the patient have any problems obtaining your medications?: No  Social/Family/Support Systems Patient Roles: Spouse Anticipated Caregiver: Wilian Kwong Anticipated Caregiver's Contact Information: [phone] Ability/Limitations of Caregiver: n/a Caregiver Availability: 24/7 Discharge Plan Discussed with Primary Caregiver: Yes Is Caregiver In Agreement with Plan?: Yes Does Caregiver/Family have Issues with Lodging/Transportation while Pt is in Rehab?: No  Goals Patient/Family Goal for Rehab: PT/OT/SLP supervision to mod I Expected length of stay: 12-15 days Additional Information: HD TRS Pt/Family Agrees to Admission and willing to participate: Yes Program Orientation Provided & Reviewed with Pt/Caregiver Including Roles  & Responsibilities: Yes   Barriers to Discharge: Insurance for SNF coverage, Decreased caregiver support  Barriers to Discharge Comments: spouse working on pulling together 24/7  Decrease burden of Care through IP rehab admission: n/a  Possible need for SNF placement upon discharge: not anticipated. Aware insurance will likely only cover CIR or SNF  Patient Condition: I have reviewed medical records from [facility], spoken with CSW, and patient and spouse. I discussed via phone for inpatient rehabilitation assessment.  Patient will benefit from ongoing PT, OT, and SLP, can actively participate in 3 hours of therapy a day 5 days of the week, and can make measurable gains during the admission.  Patient will also benefit from the coordinated team approach during an Inpatient Acute Rehabilitation admission.  The patient will receive intensive therapy as well as Rehabilitation physician, nursing, social worker, and care management interventions.  Due to bladder management, bowel management, safety, skin/wound care, disease management, medication administration, pain management, and patient education the patient requires 24 hour a day rehabilitation nursing.  The patient is  currently mod/max with mobility and basic ADLs.  Discharge setting and therapy post discharge at home with home health is anticipated.  Patient has agreed to participate in the Acute Inpatient Rehabilitation Program and will admit today.  Preadmission Screen Completed By:  Michel Santee, 04/22/2021 2:33 PM ______________________________________________________________________   Discussed status with Dr. Ranell Patrick on 04/24/21  at 10:49 AM  and received approval for admission today.  Admission Coordinator:  Michel Santee, PT, DPT time 10:49 AM Sudie Grumbling 04/24/21    Assessment/Plan: Diagnosis: Acute bilateral frontal and right parietal watershed infarcts Does the need for close, 24 hr/day Medical supervision in concert with the patient's rehab needs make it  unreasonable for this patient to be served in a less intensive setting? Yes Co-Morbidities requiring supervision/potential complications: HTN, chronic grade 2 systolic heart failure, major depressive disorder, blindness of left eye, CAD s/p STEMI Due to bladder management, bowel management, safety, skin/wound care, disease management, medication administration, pain management, and patient education, does the patient require 24 hr/day rehab nursing? Yes Does the patient require coordinated care of a physician, rehab nurse, PT, OT, and SLP to address physical and functional deficits in the context of the above medical diagnosis(es)? Yes Addressing deficits in the following areas: balance, endurance, locomotion, strength, transferring, bowel/bladder control, bathing, dressing, feeding, grooming, toileting, cognition, and psychosocial support Can the patient actively participate in an intensive therapy program of at least 3 hrs of therapy 5 days a week? Yes The potential for patient to make measurable gains while on inpatient rehab is excellent Anticipated functional outcomes upon discharge from inpatient rehab: supervision PT, supervision OT, supervision SLP Estimated rehab length of stay to reach the above functional goals is: 10-14 days Anticipated discharge destination: Home 10. Overall Rehab/Functional Prognosis: excellent   MD Signature: Leeroy Cha, MD

## 2021-04-22 NOTE — Progress Notes (Signed)
Inpatient Rehab Admissions Coordinator:  ? ?Spoke to pt's spouse over the phone to discuss CIR recommendations and goals/expectations of CIR stay.  She and pt are both interested in pursuing CIR. I reviewed 3 hrs/day of therapy with goals of supervision at discharge (initially), and spouse confirms they can try to arrange for several weeks of 24/7 supervision.  I reviewed process for insurance auth and will start that today.  Will continue to follow.  ? ?Shann Medal, PT, DPT ?Admissions Coordinator ?[phone] ?04/22/21  ?2:28 PM ? ?

## 2021-04-22 NOTE — TOC Progression Note (Signed)
Transition of Care (TOC) - Progression Note  ? ? ?Patient Details  ?Name: Maricela Kawahara. ?MRN: [id number] ?Date of Birth: December 30, 1970 ? ?Transition of Care (TOC) CM/SW Contact  ?Salome Arnt, LCSW ?Phone Number: ?04/22/2021, 2:31 PM ? ?Clinical Narrative:  PT recommending CIR. CIR has talked with family and starting authorization. TOC will follow.   ? ? ? ?  ?  ? ?Expected Discharge Plan and Services ?  ?  ?  ?  ?  ?                ?  ?  ?  ?  ?  ?  ?  ?  ?  ?  ? ? ?Social Determinants of Health (SDOH) Interventions ?  ? ?Readmission Risk Interventions ?Readmission Risk Prevention Plan 11/27/2020 02/28/2020  ?Transportation Screening Complete Complete  ?PCP or Specialist Appt within 3-5 Days Complete Complete  ?[place] or Home Care Consult Complete Complete  ?Social Work Consult for [place] Planning/Counseling Complete Complete  ?Palliative Care Screening Not Applicable Not Applicable  ?Medication Review Press photographer) Complete Complete  ?Some recent data might be hidden  ? ? ?

## 2021-04-22 NOTE — Progress Notes (Signed)
PROGRESS NOTE    Patient: [name].                            PCP: [name], MD                    DOB: 24-Jun-1970            DOA: 04/21/2021 XBM:[id number]             DOS: 04/22/2021, 1:19 PM   LOS: 0 days   Date of Service: The patient was seen and examined on 04/22/2021  Subjective:   The patient was seen and examined this morning. Stable at this time. Still complaining of :  Otherwise no issues overnight .  Brief Narrative:   [name] Berkheimer. is a 51 y.o. male with a past medical history significant for recurrent CVA, chronic CHF, DM 2, HTN, left eye inoculation, A-fib on Eliquis, ESRD on hemodialysis TTS..  Who presented to ED via EMS after hemodialysis where he had a syncopal episode woke up with the left upper extremity and lower extremity weakness, Upon evaluation patient was hypertensive with a blood pressure of 193/95 allegedly during hemodialysis he becomes hypotensive  MRI of the brain revealed bilateral acute, cortical-based infarcts with bilateral high frontal lobe and right parietal lobe-predominantly in a watershed distribution Apparently he has had a similar episode in December with dialysis. MRI at that time revealed acute left hippocampal stroke   Assessment & Plan:   Principal Problem:   Acute Bil  CVAs of Bil Frontal amd Rt Parietal  in Watershed Distribution Active Problems:   Essential hypertension, benign   Chronic Grade 2 diastolic heart failure/EF 55 to 60%   Major depressive disorder, recurrent, unspecified (HCC)   Blindness of left eye   CAD/ H/o prior STEMI 02/2020 s/p DES to LAD   AF (paroxysmal atrial fibrillation) (HCC)   Atrial fibrillation with RVR ([place])   ESRD on hemodialysis ([place])   Peritonitis ([place])  Acute Bil  CVAs of Bil Frontal amd Rt Parietal  in Watershed Distribution -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 Alvin Critchley, recommends holding Eliquis for 3 days and then rescanning patient with a noncontrast CT head on 04/24/2021 to make sure there is no bleed/son prior to restarting Eliquis -While off Eliquis ok to treat with aspirin -We will continue to monitor on telemetry follow-up  -Status post PT/OT evaluation recommending CIR -CIR consult has been placed  -Allowing permissive hypertension, with holding BP meds  -TSH normal 3.72, hemoglobin normal at 4.7, LDL 69 -2D echocardiogram:   -Continue Lipitor   2)PAFib with RVR--heart rate 120s to 130s -Avoid over aggressive control in order to avoid hypotension given acute watershed pattern strokes -Hold coreg due to concerns about hypotension risk -use metoprolol for rate as metoprolol is less likely to cause hypotension -Continue amiodarone PTA Eliquis is on hold due to # 1 above  3)ESRD-- TTS--schedule, last HD 04/21/2021 -Patient is anuric -Nephrology consulted >> possible hemodialysis in a.m.  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 -Patient currently does not have his implant with team  6) chronic anemia of ESRD compounded by iron deficiency anemia on folate and B12 deficiency -Transfuse as clinically indicated  7)H/o CAD-prior MI with DES to LAD 02/2020 -Aspirin as above #1 -Plavix and Eliquis on hold -Metoprolol mostly for rate control -Continue statin  8)Secondary hyperparathyroidism due to ESRD  -defer to nephrology service  9) chronic diastolic dysfunction CHF--Echo from 11/2020 shows grade 2 diastolic dysfunction with EF of 55 to 60% -Appears compensated -Continue to use  hemodialysis to address volume status -Coreg on hold as noted above in #2  Disposition/Need for in-Hospital Stay- patient unable to be discharged at this time due to acute stroke requiring further neuro work-up*  Dispo: The patient is from: Home              Anticipated d/c is to: SNF              Anticipated d/c date is: > 2 days  to CIR               Patient currently is not medically stable to d/c. ------------------------------------------------------------------------------------------------------------------------------------------------  DVT prophylaxis:  heparin injection 5,000 Units Start: 04/21/21 1430 SCDs Start: 04/21/21 1341 Place TED hose Start: 04/21/21 1341   Code Status:   Code Status: Full Code  Family Communication: Patient's wife at bedside updated The above findings and plan of care has been discussed with patient (and family)  in detail,  they expressed understanding and agreement of above. -Advance care planning has been discussed.   Admission status:   Status is: Inpatient Remains inpatient appropriate because: Acute stroke, stroke work-up,    Procedures:   No admission procedures for hospital encounter.   Antimicrobials:  Anti-infectives (From admission, onward)    None        Medication:   amiodarone  200 mg Oral Daily   artificial tears   Left Eye QID   aspirin EC  81 mg Oral Q breakfast   atorvastatin  80 mg Oral Daily   [START ON 04/23/2021] Chlorhexidine Gluconate Cloth  6 each Topical Q0600   dorzolamide-timolol  1 drop Right Eye BID   heparin  5,000 Units Subcutaneous Q8H   insulin aspart  0-5 Units Subcutaneous QHS   insulin aspart  0-6 Units Subcutaneous TID WC   metoprolol tartrate  25 mg Oral TID   multivitamin with minerals  1 tablet Oral Daily   prednisoLONE acetate  1 drop Left Eye Q4H   sodium chloride flush  3 mL Intravenous Q12H   sodium chloride flush  3 mL Intravenous Q12H    sodium chloride, acetaminophen **OR**  acetaminophen, bisacodyl, LORazepam, polyethylene glycol, sodium chloride flush   Objective:   Vitals:   04/22/21 0500 04/22/21 0550 04/22/21 1010 04/22/21 1100  BP:  (!) 166/76 122/78 122/78  Pulse:  64 91 91  Resp:  18    Temp:  98.5 F (36.9 C)  98.5 F (36.9 C)  TempSrc:  Oral  Oral  SpO2:  98%    Weight: 82.4 kg     Height:        Intake/Output Summary (Last 24 hours) at 04/22/2021 1319 Last data filed at 04/22/2021 1033 Gross per 24 hour  Intake 3 ml  Output --  Net 3 ml   Filed Weights   04/21/21 1009 04/21/21 1319 04/22/21 0500  Weight: 81.6 kg 81.7 kg 82.4 kg     Examination:   Physical Exam  Constitution:  Alert, cooperative, no distress,  Appears calm and comfortable, asymmetric facial droop Psychiatric:   Normal and stable mood and affect, cognition intact,   HEENT:   Left eye missing     normocephalic, PERRL, otherwise with in Normal limits  Chest:      Hemodialysis catheter left chest wall   chest symmetric Cardio vascular:  S1/S2, irregularly irregular no murmure, No Rubs or Gallops  pulmonary: Clear to auscultation bilaterally, respirations unlabored, negative wheezes / crackles Abdomen: Soft, non-tender, non-distended, bowel sounds,no masses, no organomegaly Muscular skeletal: Limited exam -left-sided weakness, right big toe amputated, fourth right finger amputated, left middle finger partially amputated  Neuro: Left upper and lower extremity weakness, asymmetric facial CNII-XII intact. ,  Sensation intact Extremities: No pitting edema lower extremities, +2 pulses  Skin: Dry, warm to touch, negative for any Rashes, No open wounds Wounds: per nursing documentation   ------------------------------------------------------------------------------------------------------------------------------------------    LABs:  CBC Latest Ref Rng & Units 04/22/2021 04/21/2021 02/13/2021  WBC 4.0 - 10.5 K/uL 9.3 6.0 10.4  Hemoglobin 13.0 - 17.0 g/dL 10.7(L) 11.4(L)  7.5(L)  Hematocrit 39.0 - 52.0 % 36.7(L) 37.5(L) 25.9(L)  Platelets 150 - 400 K/uL 156 156 200   CMP Latest Ref Rng & Units 04/22/2021 04/21/2021 02/13/2021  Glucose 70 - 99 mg/dL 70 91 81  BUN 6 - 20 mg/dL 43(H) 28(H) 48(H)  Creatinine 0.61 - 1.24 mg/dL 8.77(H) 6.63(H) 11.42(H)  Sodium 135 - 145 mmol/L 136 138 144  Potassium 3.5 - 5.1 mmol/L 4.3 3.7 3.9  Chloride 98 - 111 mmol/L 105 103 112(H)  CO2 22 - 32 mmol/L 21(L) 23 19(L)  Calcium 8.9 - 10.3 mg/dL 8.5(L) 8.5(L) 8.3(L)  Total Protein 6.5 - 8.1 g/dL - 6.9 6.2(L)  Total Bilirubin 0.3 - 1.2 mg/dL - 0.7 0.5  Alkaline Phos 38 - 126 U/L - 80 62  AST 15 - 41 U/L - 14(L) 11(L)  ALT 0 - 44 U/L - 11 6       Micro Results Recent Results (from the past 240 hour(s))  Resp Panel by RT-PCR (Flu A&B, Covid) Nasopharyngeal Swab     Status: None   Collection Time: 04/21/21 10:04 AM   Specimen: Nasopharyngeal Swab; Nasopharyngeal(NP) swabs in vial transport medium  Result Value Ref Range Status   SARS Coronavirus 2 by RT PCR NEGATIVE NEGATIVE Final    Comment: (NOTE) SARS-CoV-2 target nucleic acids are NOT DETECTED.  The SARS-CoV-2 RNA is generally detectable in upper respiratory specimens during the acute phase of infection. The lowest concentration of SARS-CoV-2 viral copies this assay can detect is 138 copies/mL. A negative result does not preclude SARS-Cov-2 infection and should not be used as the sole basis for treatment or other patient management decisions. A negative result may occur with  improper specimen collection/handling, submission of specimen other than nasopharyngeal swab, presence of viral mutation(s) within the areas targeted by this assay, and inadequate number of viral copies(<138 copies/mL). A negative result must be combined with clinical observations, patient history, and epidemiological information. The expected result is Negative.  Fact Sheet for Patients:  EntrepreneurPulse.com.au  Fact  Sheet for Healthcare Providers:  IncredibleEmployment.be  This test is no t yet approved or cleared by the [place] FDA and  has been authorized for detection and/or diagnosis of SARS-CoV-2 by FDA under an Emergency Use Authorization (EUA). This EUA will remain  in effect (meaning this test can be used) for the duration of the COVID-19 declaration under Section 564(b)(1) of the  Act, 21 U.S.C.section 360bbb-3(b)(1), unless the authorization is terminated  or revoked sooner.       Influenza A by PCR NEGATIVE NEGATIVE Final   Influenza B by PCR NEGATIVE NEGATIVE Final    Comment: (NOTE) The Xpert Xpress SARS-CoV-2/FLU/RSV plus assay is intended as an aid in the diagnosis of influenza from Nasopharyngeal swab specimens and should not be used as a sole basis for treatment. Nasal washings and aspirates are unacceptable for Xpert Xpress SARS-CoV-2/FLU/RSV testing.  Fact Sheet for Patients: EntrepreneurPulse.com.au  Fact Sheet for Healthcare Providers: IncredibleEmployment.be  This test is not yet approved or cleared by the [place] FDA and has been authorized for detection and/or diagnosis of SARS-CoV-2 by FDA under an Emergency Use Authorization (EUA). This EUA will remain in effect (meaning this test can be used) for the duration of the COVID-19 declaration under Section 564(b)(1) of the Act, 21 U.S.C. section 360bbb-3(b)(1), unless the authorization is terminated or revoked.  Performed at [facility], [address] [place], [place] [address]     Radiology Reports No results found.  SIGNED: Deatra James, MD, FHM. [facility],  Pager (please use [email or web address] to page/text) Please use Epic Secure Chat for non-urgent communication (7AM-7PM)  If 7PM-7AM, please contact night-coverage [email or web address], 04/22/2021, 1:19 PM

## 2021-04-22 NOTE — Plan of Care (Signed)
?  Problem: Acute Rehab OT Goals (only OT should resolve) ?Goal: Pt. Will Perform Eating ?Flowsheets (Taken 04/22/2021 1001) ?Pt Will Perform Eating: ? with modified independence ? sitting ? with adaptive utensils ?Goal: Pt. Will Perform Grooming ?Flowsheets (Taken 04/22/2021 1001) ?Pt Will Perform Grooming: ? sitting ? with min assist ? with adaptive equipment ?Goal: Pt. Will Perform Upper Body Bathing ?Flowsheets (Taken 04/22/2021 1001) ?Pt Will Perform Upper Body Bathing: ? with min assist ? with adaptive equipment ? sitting ?Goal: Pt. Will Perform Lower Body Bathing ?Flowsheets (Taken 04/22/2021 1001) ?Pt Will Perform Lower Body Bathing: ? sitting/lateral leans ? with min assist ? with adaptive equipment ?Goal: Pt. Will Perform Upper Body Dressing ?Flowsheets (Taken 04/22/2021 1001) ?Pt Will Perform Upper Body Dressing: ? with min assist ? with adaptive equipment ? sitting ?Goal: Pt. Will Perform Lower Body Dressing ?Flowsheets (Taken 04/22/2021 1001) ?Pt Will Perform Lower Body Dressing: ? with min assist ? with adaptive equipment ? sitting/lateral leans ?Goal: Pt. Will Transfer To Toilet ?Flowsheets (Taken 04/22/2021 1001) ?Pt Will Transfer to Toilet: ? with min assist ? stand pivot transfer ? bedside commode ?Goal: Pt. Will Perform Toileting-Clothing Manipulation ?Flowsheets (Taken 04/22/2021 1001) ?Pt Will Perform Toileting - Clothing Manipulation and hygiene: ? with mod assist ? sitting/lateral leans ?Goal: Pt/Caregiver Will Perform Home Exercise Program ?Flowsheets (Taken 04/22/2021 1001) ?Pt/caregiver will Perform Home Exercise Program: ? Increased ROM ? Left upper extremity ? Increased strength ? With minimal assist ? Xxavier Noon OT, MOT ? ?

## 2021-04-22 NOTE — Plan of Care (Signed)
?  Problem: Acute Rehab PT Goals(only PT should resolve) ?Goal: Pt Will Go Supine/Side To Sit ?Outcome: Progressing ?Flowsheets (Taken 04/22/2021 1231) ?Pt will go Supine/Side to Sit: with minimal assist ?Goal: Patient Will Transfer Sit To/From Stand ?Outcome: Progressing ?Flowsheets (Taken 04/22/2021 1231) ?Patient will transfer sit to/from stand: with minimal assist ?Goal: Pt Will Transfer Bed To Chair/Chair To Bed ?Outcome: Progressing ?Flowsheets (Taken 04/22/2021 1231) ?Pt will Transfer Bed to Chair/Chair to Bed: ? with min assist ? with mod assist ?Goal: Pt Will Ambulate ?Outcome: Progressing ?Flowsheets (Taken 04/22/2021 1231) ?Pt will Ambulate: ? 25 feet ? with moderate assist ? with rolling walker ?  ?12:32 PM, 04/22/21 ?Lonell Grandchild, MPT ?Physical Therapist with [place] ?[facility] ?[phone] office ?[phone] mobile phone ? ?

## 2021-04-22 NOTE — Progress Notes (Signed)
*  PRELIMINARY RESULTS* ?Echocardiogram ?2D Echocardiogram has been performed. ? ?[name] ?04/22/2021, 12:40 PM ?

## 2021-04-22 NOTE — Hospital Course (Signed)
[name]. is a 51 y.o. male with a past medical history significant for recurrent CVA, chronic CHF, DM 2, HTN, left eye inoculation, A-fib on Eliquis, ESRD on hemodialysis TTS..  Who presented to ED via EMS after hemodialysis where he had a syncopal episode woke up with the left upper extremity and lower extremity weakness, ?Upon evaluation patient was hypertensive with a blood pressure of 193/95 allegedly during hemodialysis he becomes hypotensive ?  ?MRI of the brain revealed bilateral acute, cortical-based infarcts with bilateral high frontal lobe and right parietal lobe-predominantly in a watershed distribution ?Apparently he has had a similar episode in December with dialysis. ?MRI at that time revealed acute left hippocampal stroke ?

## 2021-04-23 LAB — GLUCOSE, CAPILLARY
Glucose-Capillary: 82 mg/dL (ref 70–99)
Glucose-Capillary: 95 mg/dL (ref 70–99)
Glucose-Capillary: 74 mg/dL (ref 70–99)
Glucose-Capillary: 65 mg/dL — ABNORMAL LOW (ref 70–99)
Glucose-Capillary: 77 mg/dL (ref 70–99)
Glucose-Capillary: 102 mg/dL — ABNORMAL HIGH (ref 70–99)

## 2021-04-23 LAB — CBC WITH DIFFERENTIAL/PLATELET
Abs Immature Granulocytes: 0.04 10*3/uL (ref 0.00–0.07)
Basophils Absolute: 0.1 10*3/uL (ref 0.0–0.1)
Basophils Relative: 1 %
Eosinophils Absolute: 0.6 10*3/uL — ABNORMAL HIGH (ref 0.0–0.5)
Eosinophils Relative: 6 %
HCT: 40.9 % (ref 39.0–52.0)
Hemoglobin: 11.5 g/dL — ABNORMAL LOW (ref 13.0–17.0)
Immature Granulocytes: 0 %
Lymphocytes Relative: 17 %
Lymphs Abs: 1.6 10*3/uL (ref 0.7–4.0)
MCH: 22.5 pg — ABNORMAL LOW (ref 26.0–34.0)
MCHC: 28.1 g/dL — ABNORMAL LOW (ref 30.0–36.0)
MCV: 80 fL (ref 80.0–100.0)
Monocytes Absolute: 1.1 10*3/uL — ABNORMAL HIGH (ref 0.1–1.0)
Monocytes Relative: 11 %
Neutro Abs: 6.2 10*3/uL (ref 1.7–7.7)
Neutrophils Relative %: 65 %
Platelets: 171 10*3/uL (ref 150–400)
RBC: 5.11 MIL/uL (ref 4.22–5.81)
RDW: 19.8 % — ABNORMAL HIGH (ref 11.5–15.5)
WBC: 9.6 10*3/uL (ref 4.0–10.5)
nRBC: 0 % (ref 0.0–0.2)

## 2021-04-23 LAB — COMPREHENSIVE METABOLIC PANEL
ALT: 11 U/L (ref 0–44)
AST: 12 U/L — ABNORMAL LOW (ref 15–41)
Albumin: 3.2 g/dL — ABNORMAL LOW (ref 3.5–5.0)
Alkaline Phosphatase: 81 U/L (ref 38–126)
Anion gap: 14 (ref 5–15)
BUN: 62 mg/dL — ABNORMAL HIGH (ref 6–20)
CO2: 16 mmol/L — ABNORMAL LOW (ref 22–32)
Calcium: 8.7 mg/dL — ABNORMAL LOW (ref 8.9–10.3)
Chloride: 107 mmol/L (ref 98–111)
Creatinine, Ser: 11.23 mg/dL — ABNORMAL HIGH (ref 0.61–1.24)
GFR, Estimated: 5 mL/min — ABNORMAL LOW (ref >60)
Glucose, Bld: 91 mg/dL (ref 70–99)
Potassium: 5.7 mmol/L — ABNORMAL HIGH (ref 3.5–5.1)
Sodium: 137 mmol/L (ref 135–145)
Total Bilirubin: 0.6 mg/dL (ref 0.3–1.2)
Total Protein: 6.6 g/dL (ref 6.5–8.1)

## 2021-04-23 MED ORDER — FLORANEX PO PACK
1.0000 g | PACK | Freq: Three times a day (TID) | ORAL | Status: DC
Start: 2021-04-23 — End: 2021-04-24
  Administered 2021-04-23 – 2021-04-24 (×3): 1 g via ORAL
  Filled 2021-04-23 (×13): qty 1

## 2021-04-23 MED ORDER — METHOCARBAMOL 500 MG PO TABS
500.0000 mg | ORAL_TABLET | Freq: Three times a day (TID) | ORAL | Status: DC
  Administered 2021-04-23 – 2021-04-24 (×3): 500 mg via ORAL
  Filled 2021-04-23 (×3): qty 1

## 2021-04-23 MED ORDER — ALTEPLASE 2 MG IJ SOLR: 2.0000 mg | Freq: Once | INTRAMUSCULAR | Status: DC | PRN

## 2021-04-23 MED ORDER — SODIUM CHLORIDE 0.9 % IV SOLN: 100.0000 mL | INTRAVENOUS | Status: DC | PRN

## 2021-04-23 MED ORDER — HEPARIN SODIUM (PORCINE) 1000 UNIT/ML DIALYSIS: 1000.0000 [IU] | INTRAMUSCULAR | Status: DC | PRN

## 2021-04-23 MED ORDER — HEPARIN SODIUM (PORCINE) 1000 UNIT/ML IJ SOLN
INTRAMUSCULAR | Status: AC
  Administered 2021-04-23: 4600 [IU] via INTRAVENOUS_CENTRAL
  Filled 2021-04-23: qty 4

## 2021-04-23 NOTE — Progress Notes (Signed)
PROGRESS NOTE    Patient: [name].                            PCP: [name], MD                    DOB: [birth year]            DOA: 04/21/2021 OEU:[id number]             DOS: 04/23/2021, 12:40 PM   LOS: 1 day   Date of Service: The patient was seen and examined on 04/23/2021  Subjective:   The patient was seen and examined this morning Rapid response was called as patient noted to have a jerking movement in his extremities, but patient remained awake -talking. Episode was witnessed by PT staff and wife at the bedside  Patient was calmly placed in bed, physical exam remains unchanged patient was found awake and alert..  Jerking movement has subsided  Blood pressure (!) 197/91, pulse 61, temperature 98 F (36.7 C), resp. rate 19, height 6\' 3"  (1.905 m), weight 82.4 kg, SpO2 100 %.  Stat labs were ordered, blood glucose level within normal limits   Brief Narrative:   [name]. is a 51 y.o. male with a past medical history significant for recurrent CVA, chronic CHF, DM 2, HTN, left eye inoculation, A-fib on Eliquis, ESRD on hemodialysis TTS..  Who presented to ED via EMS after hemodialysis where he had a syncopal episode woke up with the left upper extremity and lower extremity weakness, Upon evaluation patient was hypertensive with a blood pressure of 193/95 allegedly during hemodialysis he becomes hypotensive  MRI of the brain revealed bilateral acute, cortical-based infarcts with bilateral high frontal lobe and right parietal lobe-predominantly in a watershed distribution Apparently he has had a similar episode in December with dialysis. MRI at that time revealed acute left hippocampal stroke   Assessment & Plan:   Principal Problem:   Acute Bil  CVAs of Bil Frontal amd Rt Parietal  in Watershed Distribution Active Problems:   Essential hypertension, benign   Chronic Grade 2 diastolic heart failure/EF 55 to 60%   Major depressive disorder, recurrent,  unspecified (HCC)   Blindness of left eye   CAD/ H/o prior STEMI 02/2020 s/p DES to LAD   AF (paroxysmal atrial fibrillation) (HCC)   Atrial fibrillation with RVR (HCC)   ESRD on hemodialysis (HCC)   Peritonitis (HCC)  Acute Bil  CVAs of Bil Frontal amd Rt Parietal  in Watershed Distribution -No significant changes with exception of jerking movement with PT this a.m. -Stat labs ordered reviewed within normal limits, -Ordering EEG, no signs of seizures as patient mentation did not change-no postictal findings  -Prior h/o Lt Hippocampus in December 2022 -MRI brain with acute strokes as noted above -CTA head and neck without LVO -Possible watershed strokes related to intra dialytic hypotensive episode -Case was discussed with neurologist Dr. Alvin Critchley, recommends holding Eliquis for 3 days and then rescanning patient with a noncontrast CT head on 04/24/2021 to make sure there is no bleed/son prior to restarting Eliquis -While off Eliquis ok to treat with aspirin -We will continue to monitor on telemetry follow-up  -Status post PT/OT evaluation recommending CIR -CIR consult has been placed  -Allowing permissive hypertension, with holding BP meds  -TSH normal 3.72, hemoglobin normal at 4.7, LDL 69 -2D echocardiogram: Ejection fraction 55-60%, severe concentric left ventricular hypertrophy, grade  1 diastolic dysfunction, severely dilated left atrium, moderate pericardial effusion  -Continue Lipitor   2)PAFib with RVR--heart rate 120s to 130s -Avoid over aggressive control in order to avoid hypotension given acute watershed pattern strokes -Hold coreg due to concerns about hypotension risk -use metoprolol for rate as metoprolol is less likely to cause hypotension -Continue amiodarone PTA Eliquis is on hold due to # 1 above  3)ESRD-- TTS--schedule, last HD 04/21/2021 -Patient is anuric -Nephrology consulted >> possible hemodialysis in a.m.  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 -Patient currently does not have his implant with team  6) chronic anemia of ESRD compounded by iron deficiency anemia on folate and B12 deficiency -Transfuse as clinically indicated  7)H/o CAD-prior MI with DES to LAD 02/2020 -Aspirin as above #1 -Plavix and Eliquis on hold -Metoprolol mostly for rate control -Continue statin  8)Secondary hyperparathyroidism due to ESRD  -defer to nephrology service  9) chronic diastolic dysfunction CHF--Echo from 11/2020 shows grade 2 diastolic dysfunction with EF of 55 to 60% -Appears compensated -Continue to use hemodialysis to address volume status -Coreg on hold as noted above in #2  Disposition/Need for in-Hospital Stay- patient unable to be discharged at this time due to acute stroke requiring further neuro work-up*  Dispo: The patient is from: Home              Anticipated d/c is to: CIR in AM               Anticipated d/c date is: > 2 days  to CIR               Patient currently is not medically stable to d/c. ------------------------------------------------------------------------------------------------------------------------------------------------  DVT prophylaxis:  heparin injection 5,000 Units Start: 04/21/21 1430 SCDs Start: 04/21/21 1341 Place TED hose Start: 04/21/21 1341   Code Status:   Code Status: Full Code  Family Communication: Patient's wife at bedside updated 04/23/21 The above findings and plan of care has been discussed with patient (and family)  in detail,  they expressed understanding and agreement of above. -Advance care planning has been discussed.   Admission status:   Status is: Inpatient Remains inpatient appropriate because: Acute stroke, stroke  work-up,    Procedures:   No admission procedures for hospital encounter.   Antimicrobials:  Anti-infectives (From admission, onward)    None        Medication:   amiodarone  200 mg Oral Daily   artificial tears   Left Eye QID   aspirin EC  81 mg Oral Q breakfast   atorvastatin  80 mg Oral Daily   Chlorhexidine Gluconate Cloth  6 each Topical Q0600   dorzolamide-timolol  1 drop Right Eye BID   heparin  5,000 Units Subcutaneous Q8H   insulin aspart  0-5 Units Subcutaneous QHS   insulin aspart  0-6 Units Subcutaneous TID WC   lactobacillus  1 g Oral TID WC   metoprolol tartrate  25 mg Oral TID   multivitamin with minerals  1 tablet Oral Daily   prednisoLONE acetate  1 drop Left Eye Q4H   sodium chloride flush  3 mL Intravenous Q12H   sodium chloride flush  3 mL Intravenous Q12H    sodium chloride, acetaminophen **OR** acetaminophen, bisacodyl,  LORazepam, polyethylene glycol, sodium chloride flush   Objective:   Vitals:   04/22/21 1432 04/22/21 2142 04/23/21 0536 04/23/21 0916  BP: (!) 178/88 (!) 158/79 (!) 176/84 (!) 197/91  Pulse: 62 62 (!) 58 61  Resp: 18 20 19    Temp: 98.3 F (36.8 C) 99.6 F (37.6 C) 98 F (36.7 C)   TempSrc: Oral Oral    SpO2: 97% 100% 94% 100%  Weight:      Height:        Intake/Output Summary (Last 24 hours) at 04/23/2021 1240 Last data filed at 04/22/2021 1700 Gross per 24 hour  Intake 240 ml  Output --  Net 240 ml   Filed Weights   04/21/21 1009 04/21/21 1319 04/22/21 0500  Weight: 81.6 kg 81.7 kg 82.4 kg     Examination:     Physical Exam:   General:  Alert, oriented, cooperative, no distress;   HEENT:  Left eye missing, asymmetric facial droop normocephalic, PERRL, otherwise with in Normal limits  Chest-hemodialysis catheter left chest wall  Neuro:  CNII-XII intact. , normal motor and sensation, reflexes intact   Lungs:   Clear to auscultation BL, Respirations unlabored, no wheezes / crackles  Cardio:    S1/S2, RRR,  No murmure, No Rubs or Gallops   Abdomen:   Soft, non-tender, bowel sounds active all four quadrants,  no guarding or peritoneal signs.  Muscular skeletal:  Limited exam - Limited exam -left-sided weakness, right big toe amputated, fourth right finger amputated, left middle finger partially amputated  In bed,  2+ pulses,  symmetric, No pitting edema  Skin:  Dry, warm to touch, negative for any Rashes,  Wounds: Please see nursing documentation   ----------------------------------------------------------------------------------------------------------    LABs:  CBC Latest Ref Rng & Units 04/23/2021 04/22/2021 04/21/2021  WBC 4.0 - 10.5 K/uL 9.6 9.3 6.0  Hemoglobin 13.0 - 17.0 g/dL 11.5(L) 10.7(L) 11.4(L)  Hematocrit 39.0 - 52.0 % 40.9 36.7(L) 37.5(L)  Platelets 150 - 400 K/uL 171 156 156   CMP Latest Ref Rng & Units 04/23/2021 04/22/2021 04/21/2021  Glucose 70 - 99 mg/dL 91 70 91  BUN 6 - 20 mg/dL 62(H) 43(H) 28(H)  Creatinine 0.61 - 1.24 mg/dL 11.23(H) 8.77(H) 6.63(H)  Sodium 135 - 145 mmol/L 137 136 138  Potassium 3.5 - 5.1 mmol/L 5.7(H) 4.3 3.7  Chloride 98 - 111 mmol/L 107 105 103  CO2 22 - 32 mmol/L 16(L) 21(L) 23  Calcium 8.9 - 10.3 mg/dL 8.7(L) 8.5(L) 8.5(L)  Total Protein 6.5 - 8.1 g/dL 6.6 - 6.9  Total Bilirubin 0.3 - 1.2 mg/dL 0.6 - 0.7  Alkaline Phos 38 - 126 U/L 81 - 80  AST 15 - 41 U/L 12(L) - 14(L)  ALT 0 - 44 U/L 11 - 11       Micro Results Recent Results (from the past 240 hour(s))  Resp Panel by RT-PCR (Flu A&B, Covid) Nasopharyngeal Swab     Status: None   Collection Time: 04/21/21 10:04 AM   Specimen: Nasopharyngeal Swab; Nasopharyngeal(NP) swabs in vial transport medium  Result Value Ref Range Status   SARS Coronavirus 2 by RT PCR NEGATIVE NEGATIVE Final    Comment: (NOTE) SARS-CoV-2 target nucleic acids are NOT DETECTED.  The SARS-CoV-2 RNA is generally detectable in upper respiratory specimens during the acute phase of infection. The lowest concentration of  SARS-CoV-2 viral copies this assay can detect is 138 copies/mL. A negative result does not preclude SARS-Cov-2 infection and should not be used as the  sole basis for treatment or other patient management decisions. A negative result may occur with  improper specimen collection/handling, submission of specimen other than nasopharyngeal swab, presence of viral mutation(s) within the areas targeted by this assay, and inadequate number of viral copies(<138 copies/mL). A negative result must be combined with clinical observations, patient history, and epidemiological information. The expected result is Negative.  Fact Sheet for Patients:  EntrepreneurPulse.com.au  Fact Sheet for Healthcare Providers:  IncredibleEmployment.be  This test is no t yet approved or cleared by the [place] FDA and  has been authorized for detection and/or diagnosis of SARS-CoV-2 by FDA under an Emergency Use Authorization (EUA). This EUA will remain  in effect (meaning this test can be used) for the duration of the COVID-19 declaration under Section 564(b)(1) of the Act, 21 U.S.C.section 360bbb-3(b)(1), unless the authorization is terminated  or revoked sooner.       Influenza A by PCR NEGATIVE NEGATIVE Final   Influenza B by PCR NEGATIVE NEGATIVE Final    Comment: (NOTE) The Xpert Xpress SARS-CoV-2/FLU/RSV plus assay is intended as an aid in the diagnosis of influenza from Nasopharyngeal swab specimens and should not be used as a sole basis for treatment. Nasal washings and aspirates are unacceptable for Xpert Xpress SARS-CoV-2/FLU/RSV testing.  Fact Sheet for Patients: EntrepreneurPulse.com.au  Fact Sheet for Healthcare Providers: IncredibleEmployment.be  This test is not yet approved or cleared by the [place] FDA and has been authorized for detection and/or diagnosis of SARS-CoV-2 by FDA under an Emergency Use  Authorization (EUA). This EUA will remain in effect (meaning this test can be used) for the duration of the COVID-19 declaration under Section 564(b)(1) of the Act, 21 U.S.C. section 360bbb-3(b)(1), unless the authorization is terminated or revoked.  Performed at [facility], [address], [place], [place] [address]     Radiology Reports No results found.  SIGNED: Deatra James, MD, FHM. [facility],  Pager (please use [email or web address] to page/text) Please use Epic Secure Chat for non-urgent communication (7AM-7PM)  If 7PM-7AM, please contact night-coverage [email or web address], 04/23/2021, 12:40 PM

## 2021-04-23 NOTE — Procedures (Signed)
? ?  HEMODIALYSIS TREATMENT NOTE: ? ? ?Pre-HD BP 172/93 with orders for 1L goal, maintaining SBP>110.  "I don't like that.  I don't have any fluid on me."  Reluctant to attempt any UF.  Lungs clear, no peripheral edema.  Also declared, "I only run 3 hours." Treatment was initiated with 1L goal (over 3.5 hours) and BP soon declined to 152/90, 117/66.  UF was stopped.  "See?  My blood pressure drops.  Don't pull any fluid."   ? ?3 hour session completed with pt repeatedly asking if I had resumed UF, which I had not.  Pt elected to end the session 30 minutes early/AMA.  Waiver was witnessed by Reather Converse, RN as pt is unable to sign.  All blood was returned.  Net UF +157 cc. ? ? ?Rockwell Alexandria, RN ?

## 2021-04-23 NOTE — Progress Notes (Signed)
Physical Therapy Treatment ?Patient Details ?Name: [name]. ?MRN: [id number] ?DOB: 04/06/1970 ?Today's Date: 04/23/2021 ? ? ?History of Present Illness [name]  is a 51 y.o. 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 Fib, DChF, who presents from hemodialysis center with sudden onset of left-sided weakness ? ?  ?PT Comments  ? ? Patient demonstrates slow labored movement for sitting up at bedside with limited use of LUE due to weakness, required Mod assist for scooting to EOB, fair/good return for completing BLE ROM/strengthening exercises while seated at bedside, good return for gripping walker with left hand, and able to take a few slow labored unsteady side steps during transfer to chair with mild jerking/spastic like movement of right side.  Once seated in chair, patient had increase in right sided jerking/spastic like movement and put back to bed due to patient's head twitching to right with c/o of difficulty breathing - RN notified and came to room to assess patient for possible rapid response.  Patient will benefit from continued skilled physical therapy in hospital and recommended venue below to increase strength, balance, endurance for safe ADLs and gait.  ?   ?Recommendations for follow up therapy are one component of a multi-disciplinary discharge planning process, led by the attending physician.  Recommendations may be updated based on patient status, additional functional criteria and insurance authorization. ? ?Follow Up Recommendations ? Acute inpatient rehab (3hours/day) ?  ?  ?Assistance Recommended at Discharge Set up Supervision/Assistance  ?Patient can return home with the following A lot of help with walking and/or transfers;A lot of help with bathing/dressing/bathroom;Help with stairs or ramp for entrance;Assistance with  cooking/housework ?  ?Equipment Recommendations ? Rolling walker (2 wheels)  ?  ?Recommendations for Other Services   ? ? ?  ?Precautions / Restrictions Precautions ?Precautions: Fall  ?  ? ?Mobility ? Bed Mobility ?Overal bed mobility: Needs Assistance ?Bed Mobility: Supine to Sit ?  ?  ?Supine to sit: Mod assist ?  ?  ?General bed mobility comments: slow labored movement with limited use of LUE due to weakness ?  ? ?Transfers ?Overall transfer level: Needs assistance ?Equipment used: Rolling walker (2 wheels) ?Transfers: Sit to/from Stand, Bed to chair/wheelchair/BSC ?Sit to Stand: Mod assist ?  ?Step pivot transfers: Mod assist, Max assist ?  ?  ?  ?General transfer comment: very unsteady on feet and limited mostly due to jerking/spastic like movement on right side ?  ? ?Ambulation/Gait ?Ambulation/Gait assistance: Mod assist, Max assist ?Gait Distance (Feet): 6 Feet ?Assistive device: Rolling walker (2 wheels) ?Gait Pattern/deviations: Decreased step length - right, Decreased step length - left, Decreased stride length, Decreased stance time - left, Ataxic ?Gait velocity: decreased ?  ?  ?General Gait Details: very unstead on feet with ataxic like movement of BLE due to weakness and unable to ambulate away from bedside due to spastic like movement of right side ? ? ?Stairs ?  ?  ?  ?  ?  ? ? ?Wheelchair Mobility ?  ? ?Modified Rankin (Stroke Patients Only) ?  ? ? ?  ?Balance Overall balance assessment: Needs assistance ?Sitting-balance support: Feet supported, No upper extremity supported ?Sitting balance-Leahy Scale: Fair ?Sitting balance - Comments: fair/good seated EOB ?  ?Standing balance support: Reliant on assistive device for balance, During functional activity, Bilateral upper extremity supported ?Standing balance-Leahy Scale: Poor ?Standing balance comment:  using RW ?  ?  ?  ?  ?  ?  ?  ?  ?  ?  ?  ?  ? ?  ?Cognition Arousal/Alertness: Awake/alert ?Behavior During Therapy: [facility] for tasks  assessed/performed ?Overall Cognitive Status: Within Functional Limits for tasks assessed ?  ?  ?  ?  ?  ?  ?  ?  ?  ?  ?  ?  ?  ?  ?  ?  ?  ?  ?  ? ?  ?Exercises General Exercises - Lower Extremity ?Long Arc Quad: Seated, AROM, Strengthening, Both, 15 reps ?Hip Flexion/Marching: Seated, AROM, Strengthening, Both, 15 reps ?Toe Raises: Seated, AROM, Strengthening, Both, 15 reps ?Heel Raises: Seated, AROM, Strengthening, Both, 15 reps ? ?  ?General Comments   ?  ?  ? ?Pertinent Vitals/Pain Pain Assessment ?Pain Assessment: No/denies pain  ? ? ?Home Living   ?  ?  ?  ?  ?  ?  ?  ?  ?  ?   ?  ?Prior Function    ?  ?  ?   ? ?PT Goals (current goals can now be found in the care plan section) Acute Rehab PT Goals ?Patient Stated Goal: return home after rehab ?PT Goal Formulation: With patient/family ?Time For Goal Achievement: 05/06/21 ?Potential to Achieve Goals: Good ?Progress towards PT goals: Progressing toward goals ? ?  ?Frequency ? ? ? Min 5X/week ? ? ? ?  ?PT Plan Current plan remains appropriate  ? ? ?Co-evaluation   ?  ?  ?  ?  ? ?  ?AM-PAC PT "6 Clicks" Mobility   ?Outcome Measure ? Help needed turning from your back to your side while in a flat bed without using bedrails?: A Lot ?Help needed moving from lying on your back to sitting on the side of a flat bed without using bedrails?: A Lot ?Help needed moving to and from a bed to a chair (including a wheelchair)?: A Lot ?Help needed standing up from a chair using your arms (e.g., wheelchair or bedside chair)?: A Lot ?Help needed to walk in hospital room?: A Lot ?Help needed climbing 3-5 steps with a railing? : Total ?6 Click Score: 11 ? ?  ?End of Session   ?Activity Tolerance: Patient tolerated treatment well;Patient limited by fatigue;Other (comment) (patient limited by spastic movement of right side) ?Patient left: in bed;with call bell/phone within reach ?Nurse Communication: Mobility status ?PT Visit Diagnosis: Unsteadiness on feet (R26.81);Other  abnormalities of gait and mobility (R26.89);Muscle weakness (generalized) (M62.81) ?  ? ? ?Time: 2836-6294 ?PT Time Calculation (min) (ACUTE ONLY): 36 min ? ?Charges:  $Therapeutic Exercise: 8-22 mins ?$Therapeutic Activity: 8-22 mins          ?          ? ?11:42 AM, 04/23/21 ?Lonell Grandchild, MPT ?Physical Therapist with [place] ?[facility] ?[phone] office ?[phone] mobile phone ? ? ?

## 2021-04-23 NOTE — Progress Notes (Signed)
Pt had two episodes today of muscle jerking/twitching movements. Rapid called the first episode. Pt was alert, talking, never lost conscious. Second episode was similar, no Rapid called. MD aware, ordered Robaxin to be taken for the muscle like spasms. Pt and pt wife both aware and agreeable with current plan. Pt currently in Dialysis.  ?

## 2021-04-23 NOTE — Progress Notes (Signed)
Patient ID: [name], male   DOB: 29-Mar-1970, 51 y.o.   MRN: [id number] S: Had an episode of jerking and yelling while working with PT and c/o SOB.  He is currently resting comfortably in bed.  O:BP (!) 197/91 (BP Location: Left Arm)    Pulse 61    Temp 98 F (36.7 C)    Resp 19    Ht 6\' 3"  (1.905 m)    Wt 82.4 kg    SpO2 100%    BMI 22.71 kg/m   Intake/Output Summary (Last 24 hours) at 04/23/2021 1106 Last data filed at 04/22/2021 1700 Gross per 24 hour  Intake 240 ml  Output --  Net 240 ml   Intake/Output: I/O last 3 completed shifts: In: 273 [P.O.:270; I.V.:3] Out: -   Intake/Output this shift:  No intake/output data recorded. Weight change:  Gen:NAD CVS: RRR Resp:CTA Abd: +BS, soft, NT/ND Ext: no edema  Recent Labs  Lab 04/21/21 1004 04/22/21 0515 04/23/21 0919  NA 138 136 137  K 3.7 4.3 5.7*  CL 103 105 107  CO2 23 21* 16*  GLUCOSE 91 70 91  BUN 28* 43* 62*  CREATININE 6.63* 8.77* 11.23*  ALBUMIN 3.3*  --  3.2*  CALCIUM 8.5* 8.5* 8.7*  AST 14*  --  12*  ALT 11  --  11   Liver Function Tests: Recent Labs  Lab 04/21/21 1004 04/23/21 0919  AST 14* 12*  ALT 11 11  ALKPHOS 80 81  BILITOT 0.7 0.6  PROT 6.9 6.6  ALBUMIN 3.3* 3.2*   No results for input(s): LIPASE, AMYLASE in the last 168 hours. No results for input(s): AMMONIA in the last 168 hours. CBC: Recent Labs  Lab 04/21/21 1004 04/22/21 0515 04/23/21 0919  WBC 6.0 9.3 9.6  NEUTROABS 3.8  --  6.2  HGB 11.4* 10.7* 11.5*  HCT 37.5* 36.7* 40.9  MCV 79.1* 80.0 80.0  PLT 156 156 171   Cardiac Enzymes: No results for input(s): CKTOTAL, CKMB, CKMBINDEX, TROPONINI in the last 168 hours. CBG: Recent Labs  Lab 04/22/21 1156 04/22/21 1605 04/22/21 2138 04/23/21 0810 04/23/21 0909  GLUCAP 79 89 102* 82 95    Iron Studies: No results for input(s): IRON, TIBC, TRANSFERRIN, FERRITIN in the last 72 hours. Studies/Results: MR BRAIN WO CONTRAST  Result Date: 04/21/2021 CLINICAL DATA:   Provided history: Neuro deficit, acute, stroke suspected. EXAM: MRI HEAD WITHOUT CONTRAST TECHNIQUE: Multiplanar, multiecho pulse sequences of the brain and surrounding structures were obtained without intravenous contrast. COMPARISON:  CT angiogram head/neck 04/21/2021. Non-contrast head CT 04/21/2021. MRI brain and MRA head 02/13/2021. FINDINGS: Brain: Cerebral volume is normal. There are patchy acute cortically-based infarcts within the bilateral high frontal lobes and right parietal lobe (predominantly within a watershed distribution). A subcentimeter acute infarct is also present within the callosal splenium on the left. Mild multifocal T2 FLAIR hyperintense signal abnormality within the cerebral white matter and pons, nonspecific but compatible chronic small vessel ischemic disease. Redemonstrated chronic lacunar infarct within the right thalamus. No evidence of an intracranial mass. No chronic intracranial blood products. No extra-axial fluid collection. No midline shift. Vascular: Maintained flow voids within the proximal large arterial vessels. Skull and upper cervical spine: No focal suspicious marrow lesion. Incompletely assessed cervical spondylosis. Sinuses/Orbits: Visualized orbits show no acute finding. Left globe prosthesis. Prior right ocular lens replacement. Mild mucosal thickening within the bilateral ethmoid, sphenoid and maxillary sinuses. IMPRESSION: Patchy acute cortically-based infarcts within the bilateral high frontal lobes and right  parietal lobe (predominantly in a watershed distribution). Subcentimeter acute infarct within the callosal splenium on the left. Mild chronic small vessel ischemic changes within the cerebral white matter and pons, similar as compared to the prior brain MRI of 02/13/2021. Redemonstrated chronic lacunar infarct within the right thalamus. Mild mucosal thickening within the bilateral ethmoid, sphenoid and maxillary sinuses. Electronically Signed   By: Kellie Simmering D.O.   On: 04/21/2021 12:41   ECHOCARDIOGRAM COMPLETE  Result Date: 04/22/2021    ECHOCARDIOGRAM REPORT   Patient Name:   Dacoda Finlay. Date of Exam: 04/22/2021 Medical Rec #:  [id number]            Height:       75.0 in Accession #:    [id number]           Weight:       181.7 lb Date of Birth:  03/07/1970             BSA:          2.106 m Patient Age:    89 years             BP:           122/78 mmHg Patient Gender: M                    HR:           91 bpm. Exam Location:  [facility] Procedure: 2D Echo, Cardiac Doppler and Color Doppler Indications:    Stroke I63.9  History:        Patient has prior history of Echocardiogram examinations, most                 recent 11/25/2020. CHF, Previous Myocardial Infarction; Risk                 Factors:Hypertension, Diabetes and Dyslipidemia. CAD/ H/o prior                 STEMI 02/2020 s/p DES to LAD, ESRD on hemodialysis [facility]), CVA                 (cerebral vascular accident) ([place]).  Sonographer:    Alvino Chapel RCS Referring Phys: [phone] COURAGE EMOKPAE IMPRESSIONS  1. Left ventricular ejection fraction, by estimation, is 55 to 60%. The left ventricle has normal function. The left ventricle has no regional wall motion abnormalities. There is severe concentric left ventricular hypertrophy. Left ventricular diastolic  parameters are consistent with Grade I diastolic dysfunction (impaired relaxation).  2. Right ventricular systolic function is normal. The right ventricular size is normal. Tricuspid regurgitation signal is inadequate for assessing PA pressure.  3. Left atrial size was severely dilated.  4. Moderate pericardial effusion.  5. The mitral valve is grossly normal. Trivial mitral valve regurgitation. No evidence of mitral stenosis.  6. The aortic valve is tricuspid. Aortic valve regurgitation is not visualized.  7. Aortic dilatation noted. There is mild dilatation of the aortic root, measuring 44 mm.  8. The inferior vena cava is normal in size with  greater than 50% respiratory variability, suggesting right atrial pressure of 3 mmHg. Comparison(s): Improvement in pericardial effusion. Conclusion(s)/Recommendation(s): There is severe LVH, biatrial enlargement, pericardial effusion, and apical sparing pattern on prior strain imaging. Consider outpatient Pyrophosphate imaging for amyloidosis if clinically indicated. FINDINGS  Left Ventricle: Left ventricular ejection fraction, by estimation, is 55 to 60%. The left ventricle has normal function. The left ventricle has no regional  wall motion abnormalities. The left ventricular internal cavity size was normal in size. There is  severe concentric left ventricular hypertrophy. Left ventricular diastolic parameters are consistent with Grade I diastolic dysfunction (impaired relaxation). Right Ventricle: The right ventricular size is normal. No increase in right ventricular wall thickness. Right ventricular systolic function is normal. Tricuspid regurgitation signal is inadequate for assessing PA pressure. Left Atrium: Left atrial size was severely dilated. Right Atrium: Right atrial size was normal in size. Pericardium: A moderately sized pericardial effusion is present. Mitral Valve: The mitral valve is grossly normal. Trivial mitral valve regurgitation. No evidence of mitral valve stenosis. Tricuspid Valve: The tricuspid valve is normal in structure. Tricuspid valve regurgitation is not demonstrated. No evidence of tricuspid stenosis. Aortic Valve: The aortic valve is tricuspid. Aortic valve regurgitation is not visualized. Pulmonic Valve: The pulmonic valve was normal in structure. Pulmonic valve regurgitation is not visualized. No evidence of pulmonic stenosis. Aorta: Aortic dilatation noted. There is mild dilatation of the aortic root, measuring 44 mm. Venous: The inferior vena cava is normal in size with greater than 50% respiratory variability, suggesting right atrial pressure of 3 mmHg. IAS/Shunts: No atrial  level shunt detected by color flow Doppler.  LEFT VENTRICLE PLAX 2D LVIDd:         4.70 cm   Diastology LVIDs:         3.10 cm   LV e' medial:    3.81 cm/s LV PW:         1.90 cm   LV E/e' medial:  11.1 LV IVS:        2.00 cm   LV e' lateral:   4.57 cm/s LVOT diam:     2.50 cm   LV E/e' lateral: 9.3 LV SV:         104 LV SV Index:   49 LVOT Area:     4.91 cm  RIGHT VENTRICLE RV S prime:     11.70 cm/s TAPSE (M-mode): 2.2 cm LEFT ATRIUM              Index        RIGHT ATRIUM           Index LA diam:        3.90 cm  1.85 cm/m   RA Area:     22.60 cm LA Vol (A2C):   116.0 ml 55.07 ml/m  RA Volume:   71.50 ml  33.94 ml/m LA Vol (A4C):   79.5 ml  37.74 ml/m LA Biplane Vol: 99.2 ml  47.09 ml/m  AORTIC VALVE LVOT Vmax:   100.00 cm/s LVOT Vmean:  64.700 cm/s LVOT VTI:    0.211 m  AORTA Ao Root diam: 4.40 cm MITRAL VALVE MV Area (PHT): 3.17 cm    SHUNTS MV Decel Time: 239 msec    Systemic VTI:  0.21 m MV E velocity: 42.40 cm/s  Systemic Diam: 2.50 cm MV A velocity: 67.00 cm/s MV E/A ratio:  0.63 Rudean Haskell MD Electronically signed by Rudean Haskell MD Signature Date/Time: 04/22/2021/3:07:17 PM    Final     amiodarone  200 mg Oral Daily   artificial tears   Left Eye QID   aspirin EC  81 mg Oral Q breakfast   atorvastatin  80 mg Oral Daily   Chlorhexidine Gluconate Cloth  6 each Topical Q0600   dorzolamide-timolol  1 drop Right Eye BID   heparin  5,000 Units Subcutaneous Q8H   insulin aspart  0-5 Units Subcutaneous  QHS   insulin aspart  0-6 Units Subcutaneous TID WC   lactobacillus  1 g Oral TID WC   metoprolol tartrate  25 mg Oral TID   multivitamin with minerals  1 tablet Oral Daily   prednisoLONE acetate  1 drop Left Eye Q4H   sodium chloride flush  3 mL Intravenous Q12H   sodium chloride flush  3 mL Intravenous Q12H    BMET    Component Value Date/Time   NA 137 04/23/2021 0919   K 5.7 (H) 04/23/2021 0919   CL 107 04/23/2021 0919   CO2 16 (L) 04/23/2021 0919   GLUCOSE 91  04/23/2021 0919   BUN 62 (H) 04/23/2021 0919   CREATININE 11.23 (H) 04/23/2021 0919   CALCIUM 8.7 (L) 04/23/2021 0919   GFRNONAA 5 (L) 04/23/2021 0919   GFRAA 5 (L) 08/27/2019 1540   CBC    Component Value Date/Time   WBC 9.6 04/23/2021 0919   RBC 5.11 04/23/2021 0919   HGB 11.5 (L) 04/23/2021 0919   HCT 40.9 04/23/2021 0919   PLT 171 04/23/2021 0919   MCV 80.0 04/23/2021 0919   MCH 22.5 (L) 04/23/2021 0919   MCHC 28.1 (L) 04/23/2021 0919   RDW 19.8 (H) 04/23/2021 0919   LYMPHSABS 1.6 04/23/2021 0919   MONOABS 1.1 (H) 04/23/2021 0919   EOSABS 0.6 (H) 04/23/2021 0919   BASOSABS 0.1 04/23/2021 0919    Dialysis Orders: Center: [facility]  on TTS . EDW 87 kg HD Bath 2K/2.5Ca  Time 3:30 Heparin . Access Left Derby catheter, RUE AVF 1800 units IVP then 400 units/hr BFR 400 DFR 800    Micera 150 mcg IVP every 2 weeks, Venofer 50 mg IV TTS, calcitriol 0.5 mcg TTS   Assessment/Plan:  Acute bilateral watershed stroke - MRA without obstruction of vessels.  Likely due to hemodynamic changes with HD as he and his wife report frequent drops of BP with HD, however after discussion with his home unit, he did not have any drop in BP during HD and had syncope after HD was completed.  Neuro following. Discussed possible need for transition to PD given risk of recurrence.  ESRD -  plan for HD today.  Cannot dose with midodrine before HD due to prolonged QT interval.  Discussed with he and his wife about possible transition to PD as above.  No heparin with HD.  Will try to maintain BP in HD.  Hypertension/volume  - stable  Anemia  - stable  Metabolic bone disease -  noncompliant with meds. Resume binders  Nutrition - renal diet, carb modified. Disposition - possible discharge to CIR.   Donetta Potts, MD [employer] [phone]

## 2021-04-23 NOTE — Progress Notes (Signed)
Staff called to room, while working with PT patient started jerking and yelling that he could not breathe and to help him. Patient assisted in bed by PT and staff. Rapid response called, MD and team arrived to room. ?

## 2021-04-23 NOTE — Progress Notes (Signed)
Pt will be unavailable for EEG today. Pt will be going to Dialysis. Will drive up tomorrow to get EEG done in the morning or when our schedule permits ?

## 2021-04-23 NOTE — Progress Notes (Signed)
Inpatient Rehab Admissions Coordinator:  ? ?Awaiting determination from [facility] regarding CIR prior auth request.  ? ?Shann Medal, PT, DPT ?Admissions Coordinator ?[phone] ?04/23/21  ?10:25 AM ? ?

## 2021-04-24 ENCOUNTER — Inpatient Hospital Stay (HOSPITAL_COMMUNITY): Payer: Medicare HMO

## 2021-04-24 ENCOUNTER — Encounter (HOSPITAL_COMMUNITY): Payer: Self-pay | Admitting: Family Medicine

## 2021-04-24 ENCOUNTER — Inpatient Hospital Stay (HOSPITAL_COMMUNITY)
Admission: RE | Admit: 2021-04-24 | Discharge: 2021-05-04 | DRG: 056 | Disposition: A | Discharge status: Home Health | Payer: Medicare HMO | Source: Other Acute Inpatient Hospital | Attending: Physical Medicine & Rehabilitation | Admitting: Physical Medicine & Rehabilitation

## 2021-04-24 ENCOUNTER — Inpatient Hospital Stay (HOSPITAL_COMMUNITY)
Admit: 2021-04-24 | Discharge: 2021-04-24 | Disposition: A | Discharge status: Home / Self Care | Payer: Medicare HMO | Attending: Family Medicine | Admitting: Family Medicine

## 2021-04-24 ENCOUNTER — Ambulatory Visit (INDEPENDENT_AMBULATORY_CARE_PROVIDER_SITE_OTHER): Payer: Medicare HMO | Admitting: Nurse Practitioner

## 2021-04-24 ENCOUNTER — Encounter (HOSPITAL_COMMUNITY): Payer: Self-pay | Admitting: Physical Medicine & Rehabilitation

## 2021-04-24 ENCOUNTER — Encounter (INDEPENDENT_AMBULATORY_CARE_PROVIDER_SITE_OTHER): Payer: Self-pay

## 2021-04-24 ENCOUNTER — Other Ambulatory Visit: Payer: Self-pay

## 2021-04-24 DIAGNOSIS — I639 Cerebral infarction, unspecified: Secondary | ICD-10-CM | POA: Diagnosis present

## 2021-04-24 DIAGNOSIS — Z20822 Contact with and (suspected) exposure to covid-19: Secondary | ICD-10-CM | POA: Diagnosis present

## 2021-04-24 DIAGNOSIS — I69398 Other sequelae of cerebral infarction: Principal | ICD-10-CM

## 2021-04-24 DIAGNOSIS — N186 End stage renal disease: Secondary | ICD-10-CM | POA: Diagnosis present

## 2021-04-24 DIAGNOSIS — Z992 Dependence on renal dialysis: Secondary | ICD-10-CM

## 2021-04-24 DIAGNOSIS — F419 Anxiety disorder, unspecified: Secondary | ICD-10-CM | POA: Diagnosis present

## 2021-04-24 DIAGNOSIS — E876 Hypokalemia: Secondary | ICD-10-CM | POA: Diagnosis present

## 2021-04-24 DIAGNOSIS — H409 Unspecified glaucoma: Secondary | ICD-10-CM | POA: Diagnosis present

## 2021-04-24 DIAGNOSIS — Z823 Family history of stroke: Secondary | ICD-10-CM

## 2021-04-24 DIAGNOSIS — K2100 Gastro-esophageal reflux disease with esophagitis, without bleeding: Secondary | ICD-10-CM | POA: Diagnosis present

## 2021-04-24 DIAGNOSIS — R569 Unspecified convulsions: Secondary | ICD-10-CM

## 2021-04-24 DIAGNOSIS — E860 Dehydration: Secondary | ICD-10-CM | POA: Diagnosis present

## 2021-04-24 DIAGNOSIS — D631 Anemia in chronic kidney disease: Secondary | ICD-10-CM

## 2021-04-24 DIAGNOSIS — I480 Paroxysmal atrial fibrillation: Secondary | ICD-10-CM | POA: Diagnosis present

## 2021-04-24 DIAGNOSIS — R531 Weakness: Secondary | ICD-10-CM | POA: Diagnosis present

## 2021-04-24 DIAGNOSIS — E1143 Type 2 diabetes mellitus with diabetic autonomic (poly)neuropathy: Secondary | ICD-10-CM | POA: Diagnosis present

## 2021-04-24 DIAGNOSIS — A0472 Enterocolitis due to Clostridium difficile, not specified as recurrent: Secondary | ICD-10-CM

## 2021-04-24 DIAGNOSIS — Z7902 Long term (current) use of antithrombotics/antiplatelets: Secondary | ICD-10-CM

## 2021-04-24 DIAGNOSIS — K649 Unspecified hemorrhoids: Secondary | ICD-10-CM | POA: Diagnosis present

## 2021-04-24 DIAGNOSIS — R42 Dizziness and giddiness: Secondary | ICD-10-CM

## 2021-04-24 DIAGNOSIS — Z833 Family history of diabetes mellitus: Secondary | ICD-10-CM

## 2021-04-24 DIAGNOSIS — E1122 Type 2 diabetes mellitus with diabetic chronic kidney disease: Secondary | ICD-10-CM | POA: Diagnosis present

## 2021-04-24 DIAGNOSIS — K3184 Gastroparesis: Secondary | ICD-10-CM | POA: Diagnosis present

## 2021-04-24 DIAGNOSIS — I69392 Facial weakness following cerebral infarction: Secondary | ICD-10-CM

## 2021-04-24 DIAGNOSIS — I509 Heart failure, unspecified: Secondary | ICD-10-CM | POA: Diagnosis present

## 2021-04-24 DIAGNOSIS — E785 Hyperlipidemia, unspecified: Secondary | ICD-10-CM | POA: Diagnosis present

## 2021-04-24 DIAGNOSIS — Z8249 Family history of ischemic heart disease and other diseases of the circulatory system: Secondary | ICD-10-CM

## 2021-04-24 DIAGNOSIS — I132 Hypertensive heart and chronic kidney disease with heart failure and with stage 5 chronic kidney disease, or end stage renal disease: Secondary | ICD-10-CM | POA: Diagnosis present

## 2021-04-24 DIAGNOSIS — Z9114 Patient's other noncompliance with medication regimen: Secondary | ICD-10-CM

## 2021-04-24 LAB — GLUCOSE, CAPILLARY
Glucose-Capillary: 106 mg/dL — ABNORMAL HIGH (ref 70–99)
Glucose-Capillary: 97 mg/dL (ref 70–99)
Glucose-Capillary: 100 mg/dL — ABNORMAL HIGH (ref 70–99)

## 2021-04-24 LAB — BASIC METABOLIC PANEL
Anion gap: 10 (ref 5–15)
BUN: 40 mg/dL — ABNORMAL HIGH (ref 6–20)
CO2: 23 mmol/L (ref 22–32)
Calcium: 8.3 mg/dL — ABNORMAL LOW (ref 8.9–10.3)
Chloride: 101 mmol/L (ref 98–111)
Creatinine, Ser: 7.85 mg/dL — ABNORMAL HIGH (ref 0.61–1.24)
GFR, Estimated: 8 mL/min — ABNORMAL LOW (ref >60)
Glucose, Bld: 87 mg/dL (ref 70–99)
Potassium: 4 mmol/L (ref 3.5–5.1)
Sodium: 134 mmol/L — ABNORMAL LOW (ref 135–145)

## 2021-04-24 LAB — HEPATITIS B CORE ANTIBODY, TOTAL: Hep B Core Total Ab: NONREACTIVE

## 2021-04-24 LAB — HEPATITIS B SURFACE ANTIGEN: Hepatitis B Surface Ag: NONREACTIVE

## 2021-04-24 LAB — PROLACTIN: Prolactin: 331 ng/mL — ABNORMAL HIGH (ref 4.0–15.2)

## 2021-04-24 LAB — HEPATITIS B SURFACE ANTIBODY,QUALITATIVE: Hep B S Ab: REACTIVE — AB

## 2021-04-24 MED ORDER — CLOPIDOGREL BISULFATE 75 MG PO TABS
75.0000 mg | ORAL_TABLET | Freq: Every day | ORAL | Status: DC
  Administered 2021-04-24 – 2021-05-04 (×11): 75 mg via ORAL
  Filled 2021-04-24 (×11): qty 1

## 2021-04-24 MED ORDER — CALCITRIOL 0.25 MCG PO CAPS
0.5000 ug | ORAL_CAPSULE | ORAL | Status: DC
  Administered 2021-04-25 – 2021-05-02 (×4): 0.5 ug via ORAL
  Filled 2021-04-24 (×6): qty 2

## 2021-04-24 MED ORDER — METHOCARBAMOL 500 MG PO TABS
500.0000 mg | ORAL_TABLET | Freq: Three times a day (TID) | ORAL | Status: DC
Start: 2021-04-24 — End: 2021-05-01
  Administered 2021-04-24 – 2021-05-01 (×20): 500 mg via ORAL
  Filled 2021-04-24 (×20): qty 1

## 2021-04-24 MED ORDER — GUAIFENESIN-DM 100-10 MG/5ML PO SYRP: 5.0000 mL | ORAL_SOLUTION | Freq: Four times a day (QID) | ORAL | Status: DC | PRN

## 2021-04-24 MED ORDER — HEPARIN SODIUM (PORCINE) 5000 UNIT/ML IJ SOLN: 5000.0000 [IU] | Freq: Three times a day (TID) | INTRAMUSCULAR | Status: DC

## 2021-04-24 MED ORDER — POLYETHYLENE GLYCOL 3350 17 G PO PACK: 17.0000 g | PACK | Freq: Every day | ORAL | Status: DC | PRN

## 2021-04-24 MED ORDER — LORAZEPAM 2 MG/ML IJ SOLN: 0.5000 mg | Freq: Four times a day (QID) | INTRAMUSCULAR | Status: DC | PRN

## 2021-04-24 MED ORDER — ACETAMINOPHEN 325 MG PO TABS
325.0000 mg | ORAL_TABLET | ORAL | Status: DC | PRN
  Administered 2021-04-26: 650 mg via ORAL
  Filled 2021-04-24: qty 2

## 2021-04-24 MED ORDER — CALCITRIOL 0.5 MCG PO CAPS
0.5000 ug | ORAL_CAPSULE | ORAL | 0 refills | Status: AC
Start: 2021-04-25 — End: 2021-05-25

## 2021-04-24 MED ORDER — CHLORHEXIDINE GLUCONATE CLOTH 2 % EX PADS
6.0000 | MEDICATED_PAD | Freq: Every day | CUTANEOUS | Status: DC
  Administered 2021-04-24: 6 via TOPICAL

## 2021-04-24 MED ORDER — BISACODYL 10 MG RE SUPP: 10.0000 mg | Freq: Every day | RECTAL | Status: DC | PRN

## 2021-04-24 MED ORDER — CALCIUM ACETATE (PHOS BINDER) 667 MG PO CAPS: 1334.0000 mg | ORAL_CAPSULE | Freq: Three times a day (TID) | ORAL | Status: DC

## 2021-04-24 MED ORDER — SORBITOL 70 % SOLN: 60.0000 mL | Freq: Every day | Status: DC | PRN

## 2021-04-24 MED ORDER — AMIODARONE HCL 200 MG PO TABS
200.0000 mg | ORAL_TABLET | Freq: Every day | ORAL | Status: DC
  Administered 2021-04-25 – 2021-05-04 (×10): 200 mg via ORAL
  Filled 2021-04-24 (×10): qty 1

## 2021-04-24 MED ORDER — INSULIN ASPART 100 UNIT/ML IJ SOLN: 0.0000 [IU] | Freq: Three times a day (TID) | INTRAMUSCULAR | Status: DC

## 2021-04-24 MED ORDER — CALCITRIOL 0.25 MCG PO CAPS: 0.5000 ug | ORAL_CAPSULE | ORAL | Status: DC

## 2021-04-24 MED ORDER — DIPHENHYDRAMINE HCL 12.5 MG/5ML PO ELIX
12.5000 mg | ORAL_SOLUTION | Freq: Four times a day (QID) | ORAL | Status: DC | PRN
  Administered 2021-04-27: 25 mg via ORAL
  Filled 2021-04-24 (×2): qty 10

## 2021-04-24 MED ORDER — DORZOLAMIDE HCL-TIMOLOL MAL 2-0.5 % OP SOLN
1.0000 [drp] | Freq: Two times a day (BID) | OPHTHALMIC | Status: DC
  Administered 2021-04-24 – 2021-05-04 (×20): 1 [drp] via OPHTHALMIC
  Filled 2021-04-24: qty 10

## 2021-04-24 MED ORDER — INSULIN ASPART 100 UNIT/ML IJ SOLN: 0.0000 [IU] | Freq: Every day | INTRAMUSCULAR | Status: DC

## 2021-04-24 MED ORDER — RENA-VITE PO TABS
1.0000 | ORAL_TABLET | Freq: Every day | ORAL | Status: DC
  Administered 2021-04-24 – 2021-05-03 (×10): 1 via ORAL
  Filled 2021-04-24 (×9): qty 1

## 2021-04-24 MED ORDER — ADULT MULTIVITAMIN W/MINERALS CH
1.0000 | ORAL_TABLET | Freq: Every day | ORAL | 0 refills | Status: AC
Start: 2021-04-25 — End: 2021-05-25

## 2021-04-24 MED ORDER — TRAZODONE HCL 50 MG PO TABS: 25.0000 mg | ORAL_TABLET | Freq: Every evening | ORAL | Status: DC | PRN

## 2021-04-24 MED ORDER — SIMETHICONE 80 MG PO CHEW: 80.0000 mg | CHEWABLE_TABLET | Freq: Four times a day (QID) | ORAL | Status: DC | PRN

## 2021-04-24 MED ORDER — ARTIFICIAL TEARS OPHTHALMIC OINT
TOPICAL_OINTMENT | Freq: Four times a day (QID) | OPHTHALMIC | Status: DC
  Administered 2021-04-29 – 2021-05-02 (×3): 1 via OPHTHALMIC
  Filled 2021-04-24: qty 3.5

## 2021-04-24 MED ORDER — HEPARIN SODIUM (PORCINE) 1000 UNIT/ML DIALYSIS
1000.0000 [IU] | INTRAMUSCULAR | Status: DC | PRN
  Filled 2021-04-24 (×3): qty 1

## 2021-04-24 MED ORDER — ATORVASTATIN CALCIUM 80 MG PO TABS
80.0000 mg | ORAL_TABLET | Freq: Every day | ORAL | Status: DC
  Administered 2021-04-25 – 2021-05-04 (×10): 80 mg via ORAL
  Filled 2021-04-24 (×10): qty 1

## 2021-04-24 MED ORDER — DIPHENHYDRAMINE HCL 50 MG/ML IJ SOLN
12.5000 mg | Freq: Once | INTRAMUSCULAR | Status: AC
  Administered 2021-04-24: 12.5 mg via INTRAVENOUS
  Filled 2021-04-24: qty 1

## 2021-04-24 MED ORDER — APIXABAN 5 MG PO TABS
5.0000 mg | ORAL_TABLET | Freq: Two times a day (BID) | ORAL | Status: DC
  Administered 2021-04-24 – 2021-05-04 (×20): 5 mg via ORAL
  Filled 2021-04-24 (×20): qty 1

## 2021-04-24 MED ORDER — CALCIUM ACETATE (PHOS BINDER) 667 MG PO CAPS
1334.0000 mg | ORAL_CAPSULE | Freq: Three times a day (TID) | ORAL | Status: DC
  Administered 2021-04-24 – 2021-05-04 (×26): 1334 mg via ORAL
  Filled 2021-04-24 (×27): qty 2

## 2021-04-24 MED ORDER — CALCIUM POLYCARBOPHIL 625 MG PO TABS
625.0000 mg | ORAL_TABLET | Freq: Two times a day (BID) | ORAL | Status: DC
  Administered 2021-04-24 – 2021-05-02 (×17): 625 mg via ORAL
  Filled 2021-04-24 (×17): qty 1

## 2021-04-24 MED ORDER — ADULT MULTIVITAMIN W/MINERALS CH
1.0000 | ORAL_TABLET | Freq: Every day | ORAL | Status: DC
  Administered 2021-04-25 – 2021-05-04 (×10): 1 via ORAL
  Filled 2021-04-24 (×10): qty 1

## 2021-04-24 MED ORDER — METOPROLOL TARTRATE 25 MG PO TABS
25.0000 mg | ORAL_TABLET | Freq: Three times a day (TID) | ORAL | Status: DC
  Administered 2021-04-24 – 2021-04-30 (×16): 25 mg via ORAL
  Filled 2021-04-24 (×18): qty 1

## 2021-04-24 MED ORDER — METHOCARBAMOL 500 MG PO TABS
500.0000 mg | ORAL_TABLET | Freq: Three times a day (TID) | ORAL | 0 refills | Status: DC
Start: 2021-04-24 — End: 2021-05-04

## 2021-04-24 MED ORDER — CALCIUM CARBONATE ANTACID 500 MG PO CHEW: 200.0000 mg | CHEWABLE_TABLET | ORAL | Status: DC | PRN

## 2021-04-24 MED ORDER — MILK AND MOLASSES ENEMA
1.0000 | Freq: Every day | RECTAL | Status: DC | PRN
  Filled 2021-04-24: qty 240

## 2021-04-24 MED ORDER — PREDNISOLONE ACETATE 1 % OP SUSP
1.0000 [drp] | OPHTHALMIC | Status: DC
  Administered 2021-04-24 – 2021-05-04 (×53): 1 [drp] via OPHTHALMIC
  Filled 2021-04-24: qty 5

## 2021-04-24 MED ORDER — FLORANEX PO PACK
1.0000 g | PACK | Freq: Three times a day (TID) | ORAL | Status: DC
  Administered 2021-04-24 – 2021-05-04 (×27): 1 g via ORAL
  Filled 2021-04-24 (×31): qty 1

## 2021-04-24 NOTE — Progress Notes (Signed)
PMR Admission Coordinator Pre-Admission Assessment   Patient: [name]. is an 51 y.o., male MRN: [id number] DOB: 06/03/1970 Height: _0  (190.5 cm) Weight: 82.4 kg   Insurance Information HMO: yes    PPO:      PCP:      IPA:      80/20:      OTHER:  PRIMARY: [employer]      Policy#: [id number]      Subscriber: pt CM Name: Dot M.      Phone#: [phone]     Fax#: [phone] Pre-Cert#: [id number] auth for CIR from Dot M. With updates due to [facility] At fax listed above on 3/9 (ext [phone])      Employer:  Benefits:  Phone #: [phone]      Name:  Eff. Date: 02/22/21     Deduct: $0      Out of Pocket Max: $3400 (met $956.30)      Life Max: n/a CIR: $295/day for days 1-6      SNF: 20 full days Outpatient:      Co-Pay: $10-$20/visit Home Health: 100%      Co-Pay:  DME: 80%     Co-Ins: 20% Providers:  SECONDARY:       Policy#:      Phone#:    Development worker, community:       Phone#:    The Actuary for patients in Inpatient Rehabilitation Facilities with attached Privacy Act [place] Records was provided and verbally reviewed with: Patient and Family   Emergency Contact Information Contact Information       Name Relation Home Work Mobile    [place] Spouse [phone]   [phone]    Alda Berthold Relative [phone]   [phone]           Current Medical History  Patient Admitting Diagnosis: CVA   History of Present Illness: Pt is a 51 y.o. male with a past medical history significant for recurrent CVA, chronic CHF, DM 2, HTN, left eye inoculation, A-fib on Eliquis, ESRD on hemodialysis TTS, who presented on 04/21/21 after hemodialysis where he had a syncopal episode and woke up with the left upper extremity and lower extremity weakness.  Upon evaluation patient was hypertensive with a blood pressure of 193/95 allegedly during hemodialysis he becomes hypotensive.  MRI of the brain revealed  bilateral acute, cortical-based infarcts with bilateral high frontal lobe and right parietal lobe-predominantly in a watershed distribution Apparently he has had a similar episode in December with dialysis. MRI at that time revealed acute left hippocampal stroke.  CTA head/neck without LVO.  F/U CT head on 3/3 revealed no acute bleeding.  Neurology recommended Eliquis and plavix be restarted.  Home meds (amiodarone and beta-blockers) restarted.  Therapy evaluations were complete and pt was recommended for CIR.      Complete NIHSS TOTAL: 2   Patient's medical record from [facility] has been reviewed by the rehabilitation admission coordinator and physician.   Past Medical History      Past Medical History:  Diagnosis Date   Anxiety     Cellulitis     CHF (congestive heart failure) (HCC)     Chronic diarrhea     Chronic kidney disease     Chronic pain of both ankles     Diabetes mellitus without complication ([place])     Diabetes mellitus, type II ([place])     Diabetic neuropathy ([place])     Frequent falls  Gait instability     GERD (gastroesophageal reflux disease)     Heart palpitations     Hemodialysis patient ([place])     History of kidney stones     HLD (hyperlipidemia)     Hypertension     Insomnia     Nausea and vomiting in adult      recurrent      Has the patient had major surgery during 100 days prior to admission? Yes   Family History   family history includes CAD in his father; Diabetes Mellitus II in his mother; Kidney failure in his mother; Schizophrenia in his mother; Stroke in his father.   Current Medications   Current Facility-Administered Medications:    0.9 %  sodium chloride infusion, 250 mL, Intravenous, PRN, Denton Brick, Courage, MD   acetaminophen (TYLENOL) tablet 650 mg, 650 mg, Oral, Q6H PRN, 650 mg at 04/22/21 0840 **OR** acetaminophen (TYLENOL) suppository 650 mg, 650 mg, Rectal, Q6H PRN, Denton Brick, Courage, MD   amiodarone (PACERONE) tablet 200 mg, 200 mg,  Oral, Daily, Emokpae, Courage, MD, 200 mg at 04/22/21 1007   artificial tears (LACRILUBE) ophthalmic ointment, , Left Eye, QID, Emokpae, Courage, MD, 1 application at 40/98/11 1022   aspirin EC tablet 81 mg, 81 mg, Oral, Q breakfast, Emokpae, Courage, MD, 81 mg at 04/22/21 0840   atorvastatin (LIPITOR) tablet 80 mg, 80 mg, Oral, Daily, Emokpae, Courage, MD, 80 mg at 04/22/21 1009   bisacodyl (DULCOLAX) suppository 10 mg, 10 mg, Rectal, Daily PRN, Roxan Hockey, MD   [START ON 04/23/2021] Chlorhexidine Gluconate Cloth 2 % PADS 6 each, 6 each, Topical, Q0600, Donato Heinz, MD   dorzolamide-timolol (COSOPT) 22.3-6.8 MG/ML ophthalmic solution 1 drop, 1 drop, Right Eye, BID, Emokpae, Courage, MD, 1 drop at 04/22/21 1013   heparin injection 5,000 Units, 5,000 Units, Subcutaneous, Q8H, Emokpae, Courage, MD, 5,000 Units at 04/22/21 1312   insulin aspart (novoLOG) injection 0-5 Units, 0-5 Units, Subcutaneous, QHS, Emokpae, Courage, MD   insulin aspart (novoLOG) injection 0-6 Units, 0-6 Units, Subcutaneous, TID WC, Emokpae, Courage, MD   LORazepam (ATIVAN) injection 0.5 mg, 0.5 mg, Intravenous, Q6H PRN, Denton Brick, Courage, MD, 0.5 mg at 04/21/21 1445   metoprolol tartrate (LOPRESSOR) tablet 25 mg, 25 mg, Oral, TID, Emokpae, Courage, MD, 25 mg at 04/22/21 1010   multivitamin with minerals tablet 1 tablet, 1 tablet, Oral, Daily, Emokpae, Courage, MD, 1 tablet at 04/22/21 1008   polyethylene glycol (MIRALAX / GLYCOLAX) packet 17 g, 17 g, Oral, Daily PRN, Emokpae, Courage, MD   prednisoLONE acetate (PRED FORTE) 1 % ophthalmic suspension 1 drop, 1 drop, Left Eye, Q4H, Emokpae, Courage, MD, 1 drop at 04/22/21 1016   sodium chloride flush (NS) 0.9 % injection 3 mL, 3 mL, Intravenous, Q12H, Emokpae, Courage, MD   sodium chloride flush (NS) 0.9 % injection 3 mL, 3 mL, Intravenous, Q12H, Emokpae, Courage, MD, 3 mL at 04/22/21 1033   sodium chloride flush (NS) 0.9 % injection 3 mL, 3 mL, Intravenous, PRN, Denton Brick,  Courage, MD   Patients Current Diet:  Diet Order                  Diet renal/carb modified with fluid restriction Diet-HS Snack? Nothing; Fluid restriction: 1200 mL Fluid; Room service appropriate? Yes; Fluid consistency: Thin  Diet effective now                         Precautions / Restrictions Precautions Precautions: Fall Restrictions Weight Bearing Restrictions:  No    Has the patient had 2 or more falls or a fall with injury in the past year? Yes   Prior Activity Level Limited Community (1-2x/wk): retired, went out for HD TRS, cane in the community, not driving   Prior Functional Level Self Care: Did the patient need help bathing, dressing, using the toilet or eating? Independent   Indoor Mobility: Did the patient need assistance with walking from room to room (with or without device)? Independent   Stairs: Did the patient need assistance with internal or external stairs (with or without device)? Independent   Functional Cognition: Did the patient need help planning regular tasks such as shopping or remembering to take medications? Independent   Patient Information Are you of Hispanic, Latino/a,or Spanish origin?: A. No, not of Hispanic, Latino/a, or Spanish origin What is your race?: B. Black or African American Do you need or want an interpreter to communicate with a doctor or health care staff?: 0. No   Patient's Response To:  Health Literacy and Transportation Is the patient able to respond to health literacy and transportation needs?: Yes Health Literacy - How often do you need to have someone help you when you read instructions, pamphlets, or other written material from your doctor or pharmacy?: Never In the past 12 months, has lack of transportation kept you from medical appointments or from getting medications?: Yes In the past 12 months, has lack of transportation kept you from meetings, work, or from getting things needed for daily living?: Yes   [place] / [place] Devices/Equipment: Radio producer (specify quad or straight), CBG Meter Home Equipment: [place] (2 wheels), Other (comment), BSC/3in1   Prior Device Use: Indicate devices/aids used by the patient prior to current illness, exacerbation or injury?  Cane in community   Current Functional Level Cognition   Overall Cognitive Status: Within Functional Limits for tasks assessed Orientation Level: Oriented X4    Extremity Assessment (includes Sensation/Coordination)   Upper Extremity Assessment: Defer to OT evaluation LUE Deficits / Details: Shoulder flexion and abduction0/5 MMT; elbow felxion and extension 0/5 MMT. Wrist flexion and extension 2-/5 MMT; 4/5 grip strength. WFL P/ROM of shoulder flexion and abduction. Minimal to moderate tone with elbow flexion and extension. Able to complete sequential finger touching with good precision if L UE held in position to do so. Mild difficulty opening deodorant container. Despite formal MMT testing results pt was observed to bear weight through L UE at times when seated at EOB. LUE Sensation: WNL LUE Coordination: decreased fine motor, decreased gross motor  Lower Extremity Assessment: Generalized weakness, LLE deficits/detail, RLE deficits/detail RLE Deficits / Details: grossly 4/5 RLE Sensation: WNL RLE Coordination: WNL LLE Deficits / Details: grossly 3+/5 except ankle dorsiflexors 3-/5 LLE Sensation: decreased light touch LLE Coordination: decreased gross motor     ADLs   Overall ADL's : Needs assistance/impaired Eating/Feeding: Sitting, Minimal assistance, Set up Grooming: Sitting, Moderate assistance Upper Body Bathing: Moderate assistance, Sitting Lower Body Bathing: Moderate assistance, Maximal assistance, Sitting/lateral leans Upper Body Dressing : Moderate assistance, Sitting Lower Body Dressing: Moderate assistance, Maximal assistance, Sitting/lateral leans Toilet Transfer: Maximal assistance,  Rolling walker (2 wheels), Stand-pivot Toilet Transfer Details (indicate cue type and reason): Simulated via EOB to chair with pt noting to have muscle spasms and poor balance in standing. Toileting- Clothing Manipulation and Hygiene: Maximal assistance, Total assistance, Bed level Tub/ Shower Transfer: Maximal assistance, Stand-pivot, Tub bench Functional mobility during ADLs: Maximal assistance, Rolling walker (2 wheels), +  2 for physical assistance General ADL Comments: Pt limited by L side weakness and poor balance in standing. Pt limited in ADL's due to minimal functional use of L UE.     Mobility   Overal bed mobility: Needs Assistance Bed Mobility: Supine to Sit Supine to sit: Mod assist General bed mobility comments: as per OT notes     Transfers   Overall transfer level: Needs assistance Equipment used: Rolling walker (2 wheels) Transfers: Sit to/from Stand, Bed to chair/wheelchair/BSC Sit to Stand: Mod assist Bed to/from chair/wheelchair/BSC transfer type:: Step pivot Step pivot transfers: Mod assist, Max assist General transfer comment: as per OT notes     Ambulation / Gait / Stairs / Wheelchair Mobility   Ambulation/Gait Ambulation/Gait assistance: Mod assist, Max assist Gait Distance (Feet): 6 Feet Assistive device: Rolling walker (2 wheels) Gait Pattern/deviations: Decreased step length - right, Decreased step length - left, Decreased stride length, Decreased stance time - left General Gait Details: limited to a few slow labored side steps with frequent buckling of knees, left weaker than right due to poor standing balance and fatigue Gait velocity: decreased     Posture / Balance Dynamic Sitting Balance Sitting balance - Comments: seated EOB Balance Overall balance assessment: Needs assistance Sitting-balance support: Feet supported, No upper extremity supported Sitting balance-Leahy Scale: Fair Sitting balance - Comments: seated EOB Postural control: Left lateral  lean (Noted to lose balance to L side at one point during sitting at EOB.) Standing balance support: During functional activity, No upper extremity supported Standing balance-Leahy Scale: Poor Standing balance comment: poor using RW, unable to stand using [facility]     Special needs/care consideration Dialysis: Hemodialysis Tuesday, Thursday, and Saturday and Diabetic management yes    Previous Home Environment (from acute therapy documentation) Living Arrangements: Spouse/significant other Available Help at Discharge: Family, Available PRN/intermittently Type of Home: House Home Layout: One level Home Access: Ramped entrance Bathroom Shower/Tub: Multimedia programmer: Handicapped height Bathroom Accessibility: Yes Barren: No Additional Comments: Pt reported no change in prior living set up since last admission.   Discharge Living Setting Plans for Discharge Living Setting: Patient's home, Lives with (comment) (spouse) Type of Home at Discharge: House Discharge Home Layout: One level Discharge Home Access: [place] entrance Discharge Bathroom Shower/Tub: Walk-in shower Discharge Bathroom Toilet: Handicapped height Discharge Bathroom Accessibility: Yes How Accessible: Accessible via walker Does the patient have any problems obtaining your medications?: No   Social/Family/Support Systems Patient Roles: Spouse Anticipated Caregiver: Gertrude Tarbet Anticipated Caregiver's Contact Information: [phone] Ability/Limitations of Caregiver: n/a Caregiver Availability: 24/7 Discharge Plan Discussed with Primary Caregiver: Yes Is Caregiver In Agreement with Plan?: Yes Does Caregiver/Family have Issues with Lodging/Transportation while Pt is in Rehab?: No   Goals Patient/Family Goal for Rehab: PT/OT/SLP supervision to mod I Expected length of stay: 12-15 days Additional Information: HD TRS Pt/Family Agrees to Admission and willing to participate: Yes Program  Orientation Provided & Reviewed with Pt/Caregiver Including Roles  & Responsibilities: Yes  Barriers to Discharge: Insurance for SNF coverage, Decreased caregiver support  Barriers to Discharge Comments: spouse working on pulling together 24/7   Decrease burden of Care through IP rehab admission: n/a   Possible need for SNF placement upon discharge: not anticipated. Aware insurance will likely only cover CIR or SNF   Patient Condition: I have reviewed medical records from [facility], spoken with CSW, and patient and spouse. I discussed via phone for inpatient rehabilitation assessment.  Patient will benefit from ongoing PT, OT,  and SLP, can actively participate in 3 hours of therapy a day 5 days of the week, and can make measurable gains during the admission.  Patient will also benefit from the coordinated team approach during an Inpatient Acute Rehabilitation admission.  The patient will receive intensive therapy as well as Rehabilitation physician, nursing, social worker, and care management interventions.  Due to bladder management, bowel management, safety, skin/wound care, disease management, medication administration, pain management, and patient education the patient requires 24 hour a day rehabilitation nursing.  The patient is currently mod/max with mobility and basic ADLs.  Discharge setting and therapy post discharge at home with home health is anticipated.  Patient has agreed to participate in the Acute Inpatient Rehabilitation Program and will admit today.   Preadmission Screen Completed By:  Michel Santee, 04/22/2021 2:33 PM ______________________________________________________________________   Discussed status with Dr. Ranell Patrick on 04/24/21  at 10:49 AM  and received approval for admission today.   Admission Coordinator:  Michel Santee, PT, DPT time 10:49 AM Sudie Grumbling 04/24/21     Assessment/Plan: Diagnosis: Acute bilateral frontal and right parietal watershed infarcts Does the need  for close, 24 hr/day Medical supervision in concert with the patient's rehab needs make it unreasonable for this patient to be served in a less intensive setting? Yes Co-Morbidities requiring supervision/potential complications: HTN, chronic grade 2 systolic heart failure, major depressive disorder, blindness of left eye, CAD s/p STEMI Due to bladder management, bowel management, safety, skin/wound care, disease management, medication administration, pain management, and patient education, does the patient require 24 hr/day rehab nursing? Yes Does the patient require coordinated care of a physician, rehab nurse, PT, OT, and SLP to address physical and functional deficits in the context of the above medical diagnosis(es)? Yes Addressing deficits in the following areas: balance, endurance, locomotion, strength, transferring, bowel/bladder control, bathing, dressing, feeding, grooming, toileting, cognition, and psychosocial support Can the patient actively participate in an intensive therapy program of at least 3 hrs of therapy 5 days a week? Yes The potential for patient to make measurable gains while on inpatient rehab is excellent Anticipated functional outcomes upon discharge from inpatient rehab: supervision PT, supervision OT, supervision SLP Estimated rehab length of stay to reach the above functional goals is: 10-14 days Anticipated discharge destination: Home 10. Overall Rehab/Functional Prognosis: excellent     MD Signature: Leeroy Cha, MD

## 2021-04-24 NOTE — H&P (Shared)
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 MRI brain done revealing patchy acute infarcts in watershed distribution affecting right parietal lobe and cortical infarcts bilateral high frontal lobes and subcentimer acute infarct in callosal splenium on the left. 2D echo done revealing EF 55-60% with no wall abnormality but severe LVH, severe LA dilatation and moderate pericardial effusion.  Patient not TNKase candidate as on eliquis. EEG done negative for seizure activity.   Eliquis/plavix placed on hold till repeat CT head 03/03 to rule out bleed. This was negative for hemorrhagic transformation therefore Eliquis/plavix resumed. He had had frequent BP drops with HD and  coreg changed to metoprolol due to ongoing issues with hypotension. EEG repeated  03/03 due to jerking/spastic episode on right with difficulty breathing yesterday.  EEG was WNL without epileptic discharge. Stroke felt to be secondary to hypoperfusion due to intradialytic hypotensive episode. He has had difficulty tolerating HD and midodrine contraindicated due to prolonged QT. and Dr. Clover Mealy questioned transition to PD    Review of Systems  Constitutional:  Negative for chills and fever.  HENT:  Negative for hearing loss and tinnitus.   Eyes:  Negative for pain.       Left eye enucleation.   Respiratory:  Negative for cough and shortness of breath.   Cardiovascular:  Negative for chest pain and leg swelling.  Gastrointestinal:  Positive for diarrhea. Negative for abdominal pain and heartburn.  Genitourinary:  Negative for dysuria and urgency.   Musculoskeletal:  Positive for joint pain (bilateral hips).  Skin:  Negative for rash.  Neurological:  Positive for sensory change and weakness. Negative for dizziness and headaches.  Psychiatric/Behavioral:  Positive for memory loss. The patient has insomnia (can't get comfortable.).     Past Medical History:  Diagnosis Date   Anxiety    Cellulitis    CHF (congestive heart failure) (HCC)    Chronic diarrhea    Chronic kidney disease    Chronic pain of both ankles    Diabetes mellitus without complication (HCC)    Diabetes mellitus, type II (HCC)    Diabetic neuropathy (HCC)    Frequent falls    Gait instability    GERD (gastroesophageal reflux disease)    Heart palpitations    Hemodialysis patient (B and E)    History of kidney stones    HLD (hyperlipidemia)    Hypertension    Insomnia    Nausea and vomiting in adult    recurrent    Past Surgical History:  Procedure Laterality Date   AMPUTATION Left 03/16/2016   Procedure: AMPUTATION DIGIT LEFT HALLUX;  Surgeon: Trula Slade, DPM;  Location: [facility];  Service: Podiatry;  Laterality: Left;  can start around 5    AMPUTATION Right 02/09/2021   Procedure: AMPUTATION DIGIT;  Surgeon: Sherilyn Cooter, MD;  Location: [place];  Service: Orthopedics;  Laterality: Right;   ARTHROSCOPIC REPAIR ACL Left    AV FISTULA PLACEMENT Right 05/31/2019   Procedure: Brachiocephalic AV fistula creation;  Surgeon: Algernon Huxley, MD;  Location: [name];  Service: Vascular;  Laterality: Right;   COLONOSCOPY WITH PROPOFOL N/A  10/28/2015   Procedure: COLONOSCOPY WITH PROPOFOL;  Surgeon: Lollie Sails, MD;  Location: [facility];  Service: Endoscopy;  Laterality: N/A;   COLONOSCOPY WITH PROPOFOL N/A 10/29/2015   Procedure: COLONOSCOPY WITH PROPOFOL;  Surgeon: Lollie Sails, MD;  Location[facility];  Service: Endoscopy;  Laterality: N/A;   CORONARY/GRAFT ACUTE MI REVASCULARIZATION N/A 02/26/2020   Procedure: Coronary/Graft Acute MI  Revascularization;  Surgeon: Jettie Booze, MD;  Location: [place] [facility];  Service: Cardiovascular;  Laterality: N/A;   DIALYSIS/PERMA CATHETER INSERTION Right 04/26/2019   Perm Cath    DIALYSIS/PERMA CATHETER INSERTION N/A 04/26/2019   Procedure: DIALYSIS/PERMA CATHETER INSERTION;  Surgeon: Algernon Huxley, MD;  Location: [place] [facility];  Service: Cardiovascular;  Laterality: N/A;   DIALYSIS/PERMA CATHETER REMOVAL N/A 08/15/2019   Procedure: DIALYSIS/PERMA CATHETER REMOVAL;  Surgeon: Katha Cabal, MD;  Location: [place] [facility];  Service: Cardiovascular;  Laterality: N/A;   EMBOLIZATION Right 02/06/2021   Procedure: EMBOLIZATION;  Surgeon: Algernon Huxley, MD;  Location: [place] [facility];  Service: Cardiovascular;  Laterality: Right;  Right Upper Extremity Dialysis Access, Permcath Placement   ESOPHAGOGASTRODUODENOSCOPY (EGD) WITH PROPOFOL N/A 12/27/2017   Procedure: ESOPHAGOGASTRODUODENOSCOPY (EGD) WITH PROPOFOL;  Surgeon: Toledo, Benay Pike, MD;  Location: [name] ENDOSCOPY;  Service: Gastroenterology;  Laterality: N/A;   INTRAVASCULAR ULTRASOUND/IVUS N/A 02/26/2020   Procedure: Intravascular Ultrasound/IVUS;  Surgeon: Jettie Booze, MD;  Location: [place] [facility];  Service: Cardiovascular;  Laterality: N/A;   LEFT HEART CATH AND CORONARY ANGIOGRAPHY N/A 02/26/2020   Procedure: LEFT HEART CATH AND CORONARY ANGIOGRAPHY;  Surgeon: Jettie Booze, MD;  Location: [place] [facility];  Service: Cardiovascular;  Laterality: N/A;   PROSTATE SURGERY  2016   TONSILECTOMY/ADENOIDECTOMY WITH MYRINGOTOMY     TONSILLECTOMY     UPPER EXTREMITY ANGIOGRAPHY Right 11/26/2020   Procedure: Upper Extremity Angiography;  Surgeon: Algernon Huxley, MD;  Location: [place] [facility];  Service: Cardiovascular;  Laterality: Right;   UPPER EXTREMITY ANGIOGRAPHY Right 02/05/2021   Procedure: UPPER EXTREMITY ANGIOGRAPHY;  Surgeon: Algernon Huxley, MD;  Location: [place] [facility];  Service:  Cardiovascular;  Laterality: Right;    Family History  Problem Relation Age of Onset   CAD Father    Stroke Father    Diabetes Mellitus II Mother    Kidney failure Mother    Schizophrenia Mother     Social History: Married. Used to work in Biomedical scientist. He reports that he has never smoked. He has never used smokeless tobacco. He reports that he does not drink alcohol and does not use drugs.   Allergies  Allergen Reactions   Levofloxacin Swelling    Of face Other reaction(s): swelling   Promethazine Diarrhea and Other (See Comments)    Other reaction(s): Other (See Comments), Other (See Comments) Other reaction(s): Muscle cramps Muscle cramps Cramping Other reaction(s): Muscle cramps Muscle cramps   Other     Other reaction(s): Unknown   Malt Other (See Comments)    Other reaction(s): cramping    Medications Prior to Admission  Medication Sig Dispense Refill   amiodarone (PACERONE) 200 MG tablet Take 1 tablet (200 mg total) by mouth daily 30 tablet 1   apixaban (ELIQUIS) 5 MG TABS tablet Take 1 tablet (5 mg total) by mouth 2 (two) times daily. 60 tablet 0   artificial tears (LACRILUBE) OINT ophthalmic ointment Place into the left eye 4 (four) times daily.     atorvastatin (LIPITOR) 80 MG  tablet Take 1 tablet (80 mg total) by mouth daily. 90 tablet 3   carvedilol (COREG) 25 MG tablet Take 1 tablet (25 mg total) by mouth 2 (two) times daily with a meal. 60 tablet 1   clopidogrel (PLAVIX) 75 MG tablet Take 1 tablet (75 mg total) by mouth daily. 90 tablet 3   dorzolamide-timolol (COSOPT) 22.3-6.8 MG/ML ophthalmic solution Place 1 drop into the right eye 2 (two) times daily.     nitroGLYCERIN (NITROSTAT) 0.4 MG SL tablet Place 1 tablet (0.4 mg total) under the tongue every 5 (five) minutes as needed for chest pain.     prednisoLONE acetate (PRED FORTE) 1 % ophthalmic suspension Place 1 drop into the left eye every 4 (four) hours.     tamsulosin (FLOMAX) 0.4 MG CAPS capsule Take  0.4 mg by mouth daily after breakfast.         Home: Home Living Family/patient expects to be discharged to:: Private residence Living Arrangements: Spouse/significant other Available Help at Discharge: Family, Available PRN/intermittently Type of Home: House Home Access: Ramped entrance Home Layout: One level Bathroom Shower/Tub: Multimedia programmer: Handicapped height Bathroom Accessibility: Yes Home Equipment: Conservation officer, nature (2 wheels), Other (comment), BSC/3in1 Additional Comments: Pt reported no change in prior living set up since last admission.   Functional History: Prior Function Prior Level of Function : Needs assist Physical Assist : Mobility (physical), ADLs (physical) Mobility (physical): Bed mobility, Transfers, Gait, Stairs Mobility Comments: Pt ambulates with cane in community. No AD use within house. ADLs Comments: Pt reportedly is indepdnent for ADL's and IADL's at baseline.  Functional Status:  Mobility: Bed Mobility Overal bed mobility: Needs Assistance Bed Mobility: Supine to Sit Supine to sit: Mod assist General bed mobility comments: slow labored movement with limited use of LUE due to weakness Transfers Overall transfer level: Needs assistance Equipment used: Rolling walker (2 wheels) Transfers: Sit to/from Stand, Bed to chair/wheelchair/BSC Sit to Stand: Mod assist Bed to/from chair/wheelchair/BSC transfer type:: Step pivot Step pivot transfers: Mod assist, Max assist General transfer comment: very unsteady on feet and limited mostly due to jerking/spastic like movement on right side Ambulation/Gait Ambulation/Gait assistance: Mod assist, Max assist Gait Distance (Feet): 6 Feet Assistive device: Rolling walker (2 wheels) Gait Pattern/deviations: Decreased step length - right, Decreased step length - left, Decreased stride length, Decreased stance time - left, Ataxic General Gait Details: very unstead on feet with ataxic like movement  of BLE due to weakness and unable to ambulate away from bedside due to spastic like movement of right side Gait velocity: decreased    ADL: ADL Overall ADL's : Needs assistance/impaired Eating/Feeding: Sitting, Minimal assistance, Set up Grooming: Sitting, Moderate assistance Upper Body Bathing: Moderate assistance, Sitting Lower Body Bathing: Moderate assistance, Maximal assistance, Sitting/lateral leans Upper Body Dressing : Moderate assistance, Sitting Lower Body Dressing: Moderate assistance, Maximal assistance, Sitting/lateral leans Toilet Transfer: Maximal assistance, Rolling walker (2 wheels), Stand-pivot Toilet Transfer Details (indicate cue type and reason): Simulated via EOB to chair with pt noting to have muscle spasms and poor balance in standing. Toileting- Clothing Manipulation and Hygiene: Maximal assistance, Total assistance, Bed level Tub/ Shower Transfer: Maximal assistance, Stand-pivot, Tub bench Functional mobility during ADLs: Maximal assistance, Rolling walker (2 wheels), +2 for physical assistance General ADL Comments: Pt limited by L side weakness and poor balance in standing. Pt limited in ADL's due to minimal functional use of L UE.  Cognition: Cognition Overall Cognitive Status: Within Functional Limits for tasks assessed Orientation Level: Oriented  X4 Cognition Arousal/Alertness: Awake/alert Behavior During Therapy: WFL for tasks assessed/performed Overall Cognitive Status: Within Functional Limits for tasks assessed   Blood pressure (!) 159/84, pulse 66, temperature 98.3 F (36.8 C), temperature source Oral, resp. rate 17, height 6\' 3"  (1.905 m), weight 81.6 kg, SpO2 100 %. Physical Exam Vitals and nursing note reviewed.  Constitutional:      Comments: Left ptosis  Eyes:     Comments: Left ptosis due to enucleation.   Musculoskeletal:     Comments: Old Right 4th finger and left great toe amputation site well healed.   Skin:    Comments: Multiple  healed abrasions bilateral shins and left ankle.   Neurological:     Mental Status: He is alert and oriented to person, place, and time.     Comments: Mild left facial droop with clear speech. Had difficulty recalling details of medical history. Left sided weakness--UE>LE.    Results for orders placed or performed during the hospital encounter of 04/21/21 (from the past 48 hour(s))  Glucose, capillary     Status: None   Collection Time: 04/22/21 11:56 AM  Result Value Ref Range   Glucose-Capillary 79 70 - 99 mg/dL    Comment: Glucose reference range applies only to samples taken after fasting for at least 8 hours.   Comment 1 Notify RN    Comment 2 Document in Chart   Glucose, capillary     Status: None   Collection Time: 04/22/21  4:05 PM  Result Value Ref Range   Glucose-Capillary 89 70 - 99 mg/dL    Comment: Glucose reference range applies only to samples taken after fasting for at least 8 hours.  Glucose, capillary     Status: Abnormal   Collection Time: 04/22/21  9:38 PM  Result Value Ref Range   Glucose-Capillary 102 (H) 70 - 99 mg/dL    Comment: Glucose reference range applies only to samples taken after fasting for at least 8 hours.  Glucose, capillary     Status: None   Collection Time: 04/23/21  8:10 AM  Result Value Ref Range   Glucose-Capillary 82 70 - 99 mg/dL    Comment: Glucose reference range applies only to samples taken after fasting for at least 8 hours.  Glucose, capillary     Status: None   Collection Time: 04/23/21  9:09 AM  Result Value Ref Range   Glucose-Capillary 95 70 - 99 mg/dL    Comment: Glucose reference range applies only to samples taken after fasting for at least 8 hours.  Comprehensive metabolic panel     Status: Abnormal   Collection Time: 04/23/21  9:19 AM  Result Value Ref Range   Sodium 137 135 - 145 mmol/L   Potassium 5.7 (H) 3.5 - 5.1 mmol/L    Comment: DELTA CHECK NOTED   Chloride 107 98 - 111 mmol/L   CO2 16 (L) 22 - 32 mmol/L    Glucose, Bld 91 70 - 99 mg/dL    Comment: Glucose reference range applies only to samples taken after fasting for at least 8 hours.   BUN 62 (H) 6 - 20 mg/dL   Creatinine, Ser 11.23 (H) 0.61 - 1.24 mg/dL   Calcium 8.7 (L) 8.9 - 10.3 mg/dL   Total Protein 6.6 6.5 - 8.1 g/dL   Albumin 3.2 (L) 3.5 - 5.0 g/dL   AST 12 (L) 15 - 41 U/L   ALT 11 0 - 44 U/L   Alkaline Phosphatase 81 38 - 126  U/L   Total Bilirubin 0.6 0.3 - 1.2 mg/dL   GFR, Estimated 5 (L) >60 mL/min    Comment: (NOTE) Calculated using the CKD-EPI Creatinine Equation (2021)    Anion gap 14 5 - 15    Comment: Performed at [facility], [address] [place], [place] [address]  CBC with Differential/Platelet     Status: Abnormal   Collection Time: 04/23/21  9:19 AM  Result Value Ref Range   WBC 9.6 4.0 - 10.5 K/uL   RBC 5.11 4.22 - 5.81 MIL/uL   Hemoglobin 11.5 (L) 13.0 - 17.0 g/dL   HCT 40.9 39.0 - 52.0 %   MCV 80.0 80.0 - 100.0 fL   MCH 22.5 (L) 26.0 - 34.0 pg   MCHC 28.1 (L) 30.0 - 36.0 g/dL   RDW 19.8 (H) 11.5 - 15.5 %   Platelets 171 150 - 400 K/uL   nRBC 0.0 0.0 - 0.2 %   Neutrophils Relative % 65 %   Neutro Abs 6.2 1.7 - 7.7 K/uL   Lymphocytes Relative 17 %   Lymphs Abs 1.6 0.7 - 4.0 K/uL   Monocytes Relative 11 %   Monocytes Absolute 1.1 (H) 0.1 - 1.0 K/uL   Eosinophils Relative 6 %   Eosinophils Absolute 0.6 (H) 0.0 - 0.5 K/uL   Basophils Relative 1 %   Basophils Absolute 0.1 0.0 - 0.1 K/uL   Immature Granulocytes 0 %   Abs Immature Granulocytes 0.04 0.00 - 0.07 K/uL    Comment: Performed at [facility], [address], [place], [place] [address]  Prolactin     Status: Abnormal   Collection Time: 04/23/21  9:19 AM  Result Value Ref Range   Prolactin 331.0 (H) 4.0 - 15.2 ng/mL    Comment: (NOTE) Performed At: [facility] [place], [place] [id number] Rush Farmer MD QP:[id number]   Glucose, capillary     Status: None   Collection Time: 04/23/21 11:17 AM  Result Value Ref  Range   Glucose-Capillary 74 70 - 99 mg/dL    Comment: Glucose reference range applies only to samples taken after fasting for at least 8 hours.  Glucose, capillary     Status: Abnormal   Collection Time: 04/23/21  4:17 PM  Result Value Ref Range   Glucose-Capillary 65 (L) 70 - 99 mg/dL    Comment: Glucose reference range applies only to samples taken after fasting for at least 8 hours.  Glucose, capillary     Status: None   Collection Time: 04/23/21  5:00 PM  Result Value Ref Range   Glucose-Capillary 77 70 - 99 mg/dL    Comment: Glucose reference range applies only to samples taken after fasting for at least 8 hours.   Comment 1 Notify RN    Comment 2 Document in Chart   Glucose, capillary     Status: Abnormal   Collection Time: 04/23/21 10:28 PM  Result Value Ref Range   Glucose-Capillary 102 (H) 70 - 99 mg/dL    Comment: Glucose reference range applies only to samples taken after fasting for at least 8 hours.  Glucose, capillary     Status: Abnormal   Collection Time: 04/24/21  8:09 AM  Result Value Ref Range   Glucose-Capillary 106 (H) 70 - 99 mg/dL    Comment: Glucose reference range applies only to samples taken after fasting for at least 8 hours.  Basic metabolic panel     Status: Abnormal   Collection Time: 04/24/21  8:26  AM  Result Value Ref Range   Sodium 134 (L) 135 - 145 mmol/L   Potassium 4.0 3.5 - 5.1 mmol/L    Comment: DELTA CHECK NOTED   Chloride 101 98 - 111 mmol/L   CO2 23 22 - 32 mmol/L   Glucose, Bld 87 70 - 99 mg/dL    Comment: Glucose reference range applies only to samples taken after fasting for at least 8 hours.   BUN 40 (H) 6 - 20 mg/dL   Creatinine, Ser 7.85 (H) 0.61 - 1.24 mg/dL   Calcium 8.3 (L) 8.9 - 10.3 mg/dL   GFR, Estimated 8 (L) >60 mL/min    Comment: (NOTE) Calculated using the CKD-EPI Creatinine Equation (2021)    Anion gap 10 5 - 15    Comment: Performed at [facility], [address] [place], [place] [address]   CT HEAD WO CONTRAST  (5MM)  Result Date: 04/24/2021 CLINICAL DATA:  51 year old male with neurologic deficit, patchy bilateral superior cortical infarcts on recent MRI. EXAM: CT HEAD WITHOUT CONTRAST TECHNIQUE: Contiguous axial images were obtained from the base of the skull through the vertex without intravenous contrast. RADIATION DOSE REDUCTION: This exam was performed according to the departmental dose-optimization program which includes automated exposure control, adjustment of the mA and/or kV according to patient size and/or use of iterative reconstruction technique. COMPARISON:  Brain MRI 04/21/2021.  Head CT 04/21/2021. FINDINGS: Brain: Bilateral superior frontal gyrus and right posterior parietal cortical infarcts seen recently by MRI remain occult largely occult by CT (series 2, image 23). No associated hemorrhage or mass effect. Stable gray-white matter differentiation elsewhere. The no superimposed midline shift, ventriculomegaly, mass effect, evidence of mass lesion, intracranial hemorrhage or new cortically based infarct. Vascular: Calcified atherosclerosis at the skull base. No suspicious intracranial vascular hyperdensity. Skull: No acute osseous abnormality identified. Sinuses/Orbits: Visualized paranasal sinuses and mastoids are stable and well aerated. Other: Widespread calcified scalp vessel atherosclerosis. Left globe prosthesis. No acute orbit or scalp squamous that suboccipital scalp soft tissue scarring. No acute orbit or scalp soft tissue finding. IMPRESSION: 1. Bilateral patchy cortical infarcts primarily at the vertex last month remain largely occult by CT. No hemorrhagic transformation or mass effect. 2. No new intracranial abnormality. 3. Extensive calcified scalp vessel atherosclerosis, most commonly observed with End stage renal disease. Electronically Signed   By: Genevie [name] M.D.   On: 04/24/2021 08:14   EEG adult  Result Date: 04/24/2021 Lora Havens, MD     04/24/2021  9:48 AM Patient Name:  [name]. MRN: [id number] Epilepsy Attending: Lora Havens Referring Physician/Provider: Deatra James, MD Date: 04/24/2021 Duration: 22.40 mins Patient history: 50 year old male with seizure-like episode.  EEG to evaluate for seizures. Level of alertness: Awake, asleep AEDs during EEG study: None Technical aspects: This EEG study was done with scalp electrodes positioned according to the 10-20 International system of electrode placement. Electrical activity was acquired at a sampling rate of 500Hz  and reviewed with a high frequency filter of 70Hz  and a low frequency filter of 1Hz . EEG data were recorded continuously and digitally stored. Description: The posterior dominant rhythm consists of 9 Hz activity of moderate voltage (25-35 uV) seen predominantly in posterior head regions, symmetric and reactive to eye opening and eye closing. Sleep was characterized by vertex waves, sleep spindles (12 to 14 Hz), maximal frontocentral region. Hyperventilation and photic stimulation were not performed.   IMPRESSION: This study is within normal limits. No seizures or epileptiform discharges were seen throughout  the recording. Lora Havens   ECHOCARDIOGRAM COMPLETE  Result Date: 04/22/2021    ECHOCARDIOGRAM REPORT   Patient Name:   [name]. Date of Exam: 04/22/2021 Medical Rec #:  [id number]            Height:       75.0 in Accession #:    [id number]           Weight:       181.7 lb Date of Birth:  18-May-1970             BSA:          2.106 m Patient Age:    38 years             BP:           122/78 mmHg Patient Gender: M                    HR:           91 bpm. Exam Location:  [facility] Procedure: 2D Echo, Cardiac Doppler and Color Doppler Indications:    Stroke I63.9  History:        Patient has prior history of Echocardiogram examinations, most                 recent 11/25/2020. CHF, Previous Myocardial Infarction; Risk                 Factors:Hypertension, Diabetes and Dyslipidemia. CAD/ H/o  prior                 STEMI 02/2020 s/p DES to LAD, ESRD on hemodialysis [facility]), CVA                 (cerebral vascular accident) ([place]).  Sonographer:    Alvino Chapel RCS Referring Phys: [phone] COURAGE [employer]MOKPAE IMPRESSIONS  1. Left ventricular ejection fraction, by estimation, is 55 to 60%. The left ventricle has normal function. The left ventricle has no regional wall motion abnormalities. There is severe concentric left ventricular hypertrophy. Left ventricular diastolic  parameters are consistent with Grade I diastolic dysfunction (impaired relaxation).  2. Right ventricular systolic function is normal. The right ventricular size is normal. Tricuspid regurgitation signal is inadequate for assessing PA pressure.  3. Left atrial size was severely dilated.  4. Moderate pericardial effusion.  5. The mitral valve is grossly normal. Trivial mitral valve regurgitation. No evidence of mitral stenosis.  6. The aortic valve is tricuspid. Aortic valve regurgitation is not visualized.  7. Aortic dilatation noted. There is mild dilatation of the aortic root, measuring 44 mm.  8. The inferior vena cava is normal in size with greater than 50% respiratory variability, suggesting right atrial pressure of 3 mmHg. Comparison(s): Improvement in pericardial effusion. Conclusion(s)/Recommendation(s): There is severe LVH, biatrial enlargement, pericardial effusion, and apical sparing pattern on prior strain imaging. Consider outpatient Pyrophosphate imaging for amyloidosis if clinically indicated. FINDINGS  Left Ventricle: Left ventricular ejection fraction, by estimation, is 55 to 60%. The left ventricle has normal function. The left ventricle has no regional wall motion abnormalities. The left ventricular internal cavity size was normal in size. There is  severe concentric left ventricular hypertrophy. Left ventricular diastolic parameters are consistent with Grade I diastolic dysfunction (impaired relaxation). Right Ventricle: The  right ventricular size is normal. No increase in right ventricular wall thickness. Right ventricular systolic function is normal. Tricuspid regurgitation signal is inadequate for assessing PA pressure. Left Atrium: Left atrial  size was severely dilated. Right Atrium: Right atrial size was normal in size. Pericardium: A moderately sized pericardial effusion is present. Mitral Valve: The mitral valve is grossly normal. Trivial mitral valve regurgitation. No evidence of mitral valve stenosis. Tricuspid Valve: The tricuspid valve is normal in structure. Tricuspid valve regurgitation is not demonstrated. No evidence of tricuspid stenosis. Aortic Valve: The aortic valve is tricuspid. Aortic valve regurgitation is not visualized. Pulmonic Valve: The pulmonic valve was normal in structure. Pulmonic valve regurgitation is not visualized. No evidence of pulmonic stenosis. Aorta: Aortic dilatation noted. There is mild dilatation of the aortic root, measuring 44 mm. Venous: The inferior vena cava is normal in size with greater than 50% respiratory variability, suggesting right atrial pressure of 3 mmHg. IAS/Shunts: No atrial level shunt detected by color flow Doppler.  LEFT VENTRICLE PLAX 2D LVIDd:         4.70 cm   Diastology LVIDs:         3.10 cm   LV e' medial:    3.81 cm/s LV PW:         1.90 cm   LV E/e' medial:  11.1 LV IVS:        2.00 cm   LV e' lateral:   4.57 cm/s LVOT diam:     2.50 cm   LV E/e' lateral: 9.3 LV SV:         104 LV SV Index:   49 LVOT Area:     4.91 cm  RIGHT VENTRICLE RV S prime:     11.70 cm/s TAPSE (M-mode): 2.2 cm LEFT ATRIUM              Index        RIGHT ATRIUM           Index LA diam:        3.90 cm  1.85 cm/m   RA Area:     22.60 cm LA Vol (A2C):   116.0 ml 55.07 ml/m  RA Volume:   71.50 ml  33.94 ml/m LA Vol (A4C):   79.5 ml  37.74 ml/m LA Biplane Vol: 99.2 ml  47.09 ml/m  AORTIC VALVE LVOT Vmax:   100.00 cm/s LVOT Vmean:  64.700 cm/s LVOT VTI:    0.211 m  AORTA Ao Root diam: 4.40 cm  MITRAL VALVE MV Area (PHT): 3.17 cm    SHUNTS MV Decel Time: 239 msec    Systemic VTI:  0.21 m MV E velocity: 42.40 cm/s  Systemic Diam: 2.50 cm MV A velocity: 67.00 cm/s MV E/A ratio:  0.63 Rudean Haskell MD Electronically signed by Rudean Haskell MD Signature Date/Time: 04/22/2021/3:07:17 PM    Final       Blood pressure (!) 159/84, pulse 66, temperature 98.3 F (36.8 C), temperature source Oral, resp. rate 17, height 6\' 3"  (1.905 m), weight 81.6 kg, SpO2 100 %.  Medical Problem List and Plan: 1. Functional deficits secondary to ***  -patient may *** shower  -ELOS/Goals: *** 2.  Antithrombotics: -DVT/anticoagulation:  Pharmaceutical: Other (comment)--resume Eliquis  -antiplatelet therapy: Resume Plavix 3. Pain Management: Tylenol prn.  4. Mood: LCSW to follow for evaluation and support.   -antipsychotic agents: NA 5. Neuropsych: This patient may be capable of making decisions on his own behalf. 6. Skin/Wound Care:  Routine pressure relief measures.  7. Fluids/Electrolytes/Nutrition: Strict I/O --not on any fluid restriction at this time.  8. Hypotension: Monitor for orthostatic/syncopal episodes with increase in activity.  --continue metoprolol bid  9.  CAF: Monitor HR TID 10. T2DM w/neuropathy and gastroparesis: Not on any meds as A1C normalized-4.7.   --BS have been well controlled. Will check once a day.   11. H/o GERD w/gastritis/esophagitis: 12. ESRD: Continue HD TTS at the end of the day to help with tolerance of therapy.  -- on phoslo and calcitriol for metabolic bone disese.  --not in favor of transitioning to PD. Plans for graft in LUE? 13. Anemia of chronic disease: Stable w/ Hgb @ 11.5 on 03/02.  14. Glaucoma: Resume cosopt eye drops.  13. Diarrhea: will add fiber for bulking.   ***  Bary Leriche, PA-C 04/24/2021

## 2021-04-24 NOTE — Telephone Encounter (Signed)
Left message for patient to call and discuss rescheduling the [facility] ordered by Dr. Irish Lack ?

## 2021-04-24 NOTE — Discharge Summary (Signed)
Physician Discharge Summary   Patient: [name]. MRN: [id number] DOB: 1970-03-08  Admit date:     04/21/2021  Discharge date: 04/24/21  Discharge Physician: Deatra James   PCP: [name], MD   Recommendations at discharge:  Follow-up with nephrologist within next hemodialysis Continue discussion with nephrologist regarding peritoneal dialysis or to continue regular hemodialysis (due to blood pressure fluctuation with hemodialysis) Per neurology resuming Eliquis and Plavix--- to follow-up with neurology within 1-2 weeks--to continue determining the need for both Recommending aggressive PT OT, fall precautions At this point patient is cleared for CIR discharge  Discharge Diagnoses: Principal Problem:   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 *   Hospital Course: [name]. is a 51 y.o. male with a past medical history significant for recurrent CVA, chronic CHF, DM 2, HTN, left eye inoculation, A-fib on Eliquis, ESRD on hemodialysis TTS..  Who presented to ED via EMS after hemodialysis where he had a syncopal episode woke up with the left upper extremity and lower extremity weakness, Upon evaluation patient was hypertensive with a blood pressure of 193/95 allegedly during hemodialysis he becomes hypotensive   MRI of the brain revealed bilateral acute, cortical-based infarcts with bilateral high frontal lobe and right parietal lobe-predominantly in a watershed distribution Apparently he has had a similar  episode in December with dialysis. 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 Alvin Critchley, recommends holding Eliquis for 3 days and then rescanning patient with a noncontrast   -CT head 04/24/2021-revealed no acute bleeding-chronic changes-Per neurology recommendation Eliquis and Plavix to be restarted -While patient was off Eliquis and Plavix in the hospital was on aspirin only    -Status post PT/OT evaluation recommending CIR -Home medication of amiodarone, beta-blockers has been restarted   -TSH normal 3.72, Hgb A1c normal at 4.7, LDL 69 -2D echocardiogram: Ejection fraction 55-60%, severe concentric left ventricular hypertrophy, grade 1 diastolic dysfunction, severely dilated left atrium, moderate pericardial effusion   -Continue Lipitor     2)PAFib with RVR--heart rate 120s to 130s -Avoid over aggressive control in order to avoid hypotension given acute watershed pattern strokes -Hold coreg due to concerns about hypotension risk -use metoprolol for rate as metoprolol is less likely to cause hypotension -Continue amiodarone PTA Eliquis    3)ESRD-- TTS--schedule, last HD 04/21/2021 -Patient is anuric -Nephrology following closely, last hemodialysis 3-04/2021. 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 -Patient currently does not have his implant with team   6) chronic anemia of ESRD compounded by iron deficiency anemia on folate and B12 deficiency    7)H/o CAD-prior MI with DES to LAD 02/2020 -Aspirin as above #1 -Plavix and Eliquis on hold -Metoprolol mostly for rate control -Continue statin   8)Secondary hyperparathyroidism due to ESRD  -defer to nephrology service   9) chronic diastolic dysfunction CHF--Echo from 11/2020 shows grade 2 diastolic dysfunction with EF of 55 to 60% -Appears compensated -Continue to use hemodialysis to address volume status -Resuming home medication including Coreg      Dispo: The patient is from: Home              Anticipated d/c is to: CIR        Consultants: Nephrology/neurology/PT/OT/social worker Procedures performed: MRI, CT of the head Disposition: Rehabilitation facility Diet recommendation:  Discharge Diet Orders (From admission, onward)     Start     Ordered   04/24/21 0000  Diet - low sodium heart healthy        04/24/21 1027           Renal diet  DISCHARGE MEDICATION: Allergies as of 04/24/2021       Reactions   Levofloxacin Swelling   Of face Other reaction(s): swelling   Promethazine Diarrhea, Other (See Comments)   Other reaction(s): Other (See Comments), Other (See Comments) Other reaction(s): Muscle cramps Muscle cramps Cramping Other reaction(s): Muscle cramps Muscle cramps   Other    Other reaction(s): Unknown   Malt Other (See Comments)   Other reaction(s): cramping        Medication List     TAKE these medications    amiodarone 200 MG tablet Commonly known as: Pacerone Take 1 tablet (200 mg total) by mouth daily   artificial tears Oint ophthalmic ointment Commonly known as: LACRILUBE Place into the left eye 4  (four) times daily.   atorvastatin 80 MG tablet Commonly known as: LIPITOR Take 1 tablet (80 mg total) by mouth daily.   calcitRIOL 0.5 MCG capsule Commonly known as: ROCALTROL Take 1 capsule (0.5 mcg total) by mouth Every Tuesday,Thursday,and Saturday with dialysis. Start taking on: April 25, 2021   carvedilol 25 MG tablet Commonly known as: COREG Take 1 tablet (25 mg total) by mouth 2 (two) times daily with a meal.   clopidogrel 75 MG tablet Commonly known as: PLAVIX Take 1 tablet (75 mg total) by mouth daily.   dorzolamide-timolol 22.3-6.8 MG/ML ophthalmic solution Commonly known as: COSOPT Place 1 drop into the right eye 2 (two) times daily.   Eliquis 5 MG Tabs tablet Generic drug: apixaban Take 1 tablet (5 mg total) by mouth 2 (two) times daily.   methocarbamol 500 MG tablet Commonly known as: ROBAXIN Take 1 tablet (500 mg total) by mouth 3 (three) times daily for 10 days.   multivitamin with minerals Tabs tablet Take 1 tablet by mouth daily. Start taking on: April 25, 2021   nitroGLYCERIN 0.4 MG SL tablet Commonly known as: NITROSTAT Place 1 tablet (0.4 mg total) under the tongue every 5 (five) minutes as needed for chest pain.   prednisoLONE acetate 1 % ophthalmic suspension Commonly known as: PRED FORTE Place 1 drop into the left eye every 4 (four) hours.   tamsulosin 0.4 MG  Caps capsule Commonly known as: FLOMAX Take 0.4 mg by mouth daily after breakfast.               Discharge Care Instructions  (From admission, onward)           Start     Ordered   04/24/21 0000  No dressing needed        04/24/21 1027             Discharge Exam: Filed Weights   04/22/21 0500 04/23/21 1820 04/24/21 0500  Weight: 82.4 kg 82.8 kg 81.6 kg      Physical Exam:   General:  Alert, oriented, cooperative, no distress;   HEENT:  Left eye missing Normocephalic, PERRL, otherwise with in Normal limits   Neuro:  Left-sided weakness CNII-XII intact. ,  normal motor and sensation, reflexes intact   Lungs:   Clear to auscultation BL, Respirations unlabored, no wheezes / crackles  Cardio:    S1/S2, RRR, No murmure, No Rubs or Gallops   Abdomen:   Soft, non-tender, bowel sounds active all four quadrants,  no guarding or peritoneal signs.  Muscular skeletal:  Limited exam - left-sided weakness, right big toe amputated, fourth right finger amputated, left middle finger partially amputated  2+ pulses,  symmetric, No pitting edema  Skin:  Dry, warm to touch, negative for any Rashes,  Wounds: Please see nursing documentation          Condition at discharge: stable  The results of significant diagnostics from this hospitalization (including imaging, microbiology, ancillary and laboratory) are listed below for reference.   Imaging Studies: CT HEAD WO CONTRAST (5MM)  Result Date: 04/24/2021 CLINICAL DATA:  51 year old male with neurologic deficit, patchy bilateral superior cortical infarcts on recent MRI. EXAM: CT HEAD WITHOUT CONTRAST TECHNIQUE: Contiguous axial images were obtained from the base of the skull through the vertex without intravenous contrast. RADIATION DOSE REDUCTION: This exam was performed according to the departmental dose-optimization program which includes automated exposure control, adjustment of the mA and/or kV according to patient size and/or use of iterative reconstruction technique. COMPARISON:  Brain MRI 04/21/2021.  Head CT 04/21/2021. FINDINGS: Brain: Bilateral superior frontal gyrus and right posterior parietal cortical infarcts seen recently by MRI remain occult largely occult by CT (series 2, image 23). No associated hemorrhage or mass effect. Stable gray-white matter differentiation elsewhere. The no superimposed midline shift, ventriculomegaly, mass effect, evidence of mass lesion, intracranial hemorrhage or new cortically based infarct. Vascular: Calcified atherosclerosis at the skull base. No suspicious intracranial  vascular hyperdensity. Skull: No acute osseous abnormality identified. Sinuses/Orbits: Visualized paranasal sinuses and mastoids are stable and well aerated. Other: Widespread calcified scalp vessel atherosclerosis. Left globe prosthesis. No acute orbit or scalp squamous that suboccipital scalp soft tissue scarring. No acute orbit or scalp soft tissue finding. IMPRESSION: 1. Bilateral patchy cortical infarcts primarily at the vertex last month remain largely occult by CT. No hemorrhagic transformation or mass effect. 2. No new intracranial abnormality. 3. Extensive calcified scalp vessel atherosclerosis, most commonly observed with End stage renal disease. Electronically Signed   By: Genevie [name] M.D.   On: 04/24/2021 08:14   MR BRAIN WO CONTRAST  Result Date: 04/21/2021 CLINICAL DATA:  Provided history: Neuro deficit, acute, stroke suspected. EXAM: MRI HEAD WITHOUT CONTRAST TECHNIQUE: Multiplanar, multiecho pulse sequences of the brain and surrounding structures were obtained without intravenous contrast. COMPARISON:  CT angiogram head/neck 04/21/2021. Non-contrast head CT 04/21/2021. MRI brain and MRA head 02/13/2021. FINDINGS: Brain: Cerebral volume  is normal. There are patchy acute cortically-based infarcts within the bilateral high frontal lobes and right parietal lobe (predominantly within a watershed distribution). A subcentimeter acute infarct is also present within the callosal splenium on the left. Mild multifocal T2 FLAIR hyperintense signal abnormality within the cerebral white matter and pons, nonspecific but compatible chronic small vessel ischemic disease. Redemonstrated chronic lacunar infarct within the right thalamus. No evidence of an intracranial mass. No chronic intracranial blood products. No extra-axial fluid collection. No midline shift. Vascular: Maintained flow voids within the proximal large arterial vessels. Skull and upper cervical spine: No focal suspicious marrow lesion. Incompletely  assessed cervical spondylosis. Sinuses/Orbits: Visualized orbits show no acute finding. Left globe prosthesis. Prior right ocular lens replacement. Mild mucosal thickening within the bilateral ethmoid, sphenoid and maxillary sinuses. IMPRESSION: Patchy acute cortically-based infarcts within the bilateral high frontal lobes and right parietal lobe (predominantly in a watershed distribution). Subcentimeter acute infarct within the callosal splenium on the left. Mild chronic small vessel ischemic changes within the cerebral white matter and pons, similar as compared to the prior brain MRI of 02/13/2021. Redemonstrated chronic lacunar infarct within the right thalamus. Mild mucosal thickening within the bilateral ethmoid, sphenoid and maxillary sinuses. Electronically Signed   By: Kellie Simmering D.O.   On: 04/21/2021 12:41   EEG adult  Result Date: 04/24/2021 Lora Havens, MD     04/24/2021  9:48 AM Patient Name: [name] MRN: [id number] Epilepsy Attending: Lora Havens Referring Physician/Provider: Deatra James, MD Date: 04/24/2021 Duration: 22.40 mins Patient history: 51 year old male with seizure-like episode.  EEG to evaluate for seizures. Level of alertness: Awake, asleep AEDs during EEG study: None Technical aspects: This EEG study was done with scalp electrodes positioned according to the 10-20 International system of electrode placement. Electrical activity was acquired at a sampling rate of 500Hz  and reviewed with a high frequency filter of 70Hz  and a low frequency filter of 1Hz . EEG data were recorded continuously and digitally stored. Description: The posterior dominant rhythm consists of 9 Hz activity of moderate voltage (25-35 uV) seen predominantly in posterior head regions, symmetric and reactive to eye opening and eye closing. Sleep was characterized by vertex waves, sleep spindles (12 to 14 Hz), maximal frontocentral region. Hyperventilation and photic stimulation were not  performed.   IMPRESSION: This study is within normal limits. No seizures or epileptiform discharges were seen throughout the recording. Lora Havens   ECHOCARDIOGRAM COMPLETE  Result Date: 04/22/2021    ECHOCARDIOGRAM REPORT   Patient Name:   Johathon Overturf. Date of Exam: 04/22/2021 Medical Rec #:  [id number]            Height:       75.0 in Accession #:    [id number]           Weight:       181.7 lb Date of Birth:  1970-04-28             BSA:          2.106 m Patient Age:    51 years             BP:           122/78 mmHg Patient Gender: M                    HR:           91 bpm. Exam Location:  [facility] Procedure: 2D Echo, Cardiac Doppler and  Color Doppler Indications:    Stroke I63.9  History:        Patient has prior history of Echocardiogram examinations, most                 recent 11/25/2020. CHF, Previous Myocardial Infarction; Risk                 Factors:Hypertension, Diabetes and Dyslipidemia. CAD/ H/o prior                 STEMI 02/2020 s/p DES to LAD, ESRD on hemodialysis [facility]), CVA                 (cerebral vascular accident) ([place]).  Sonographer:    Alvino Chapel RCS Referring Phys: [id number] [employer] IMPRESSIONS  1. Left ventricular ejection fraction, by estimation, is 55 to 60%. The left ventricle has normal function. The left ventricle has no regional wall motion abnormalities. There is severe concentric left ventricular hypertrophy. Left ventricular diastolic  parameters are consistent with Grade I diastolic dysfunction (impaired relaxation).  2. Right ventricular systolic function is normal. The right ventricular size is normal. Tricuspid regurgitation signal is inadequate for assessing PA pressure.  3. Left atrial size was severely dilated.  4. Moderate pericardial effusion.  5. The mitral valve is grossly normal. Trivial mitral valve regurgitation. No evidence of mitral stenosis.  6. The aortic valve is tricuspid. Aortic valve regurgitation is not visualized.  7. Aortic dilatation  noted. There is mild dilatation of the aortic root, measuring 44 mm.  8. The inferior vena cava is normal in size with greater than 50% respiratory variability, suggesting right atrial pressure of 3 mmHg. Comparison(s): Improvement in pericardial effusion. Conclusion(s)/Recommendation(s): There is severe LVH, biatrial enlargement, pericardial effusion, and apical sparing pattern on prior strain imaging. Consider outpatient Pyrophosphate imaging for amyloidosis if clinically indicated. FINDINGS  Left Ventricle: Left ventricular ejection fraction, by estimation, is 55 to 60%. The left ventricle has normal function. The left ventricle has no regional wall motion abnormalities. The left ventricular internal cavity size was normal in size. There is  severe concentric left ventricular hypertrophy. Left ventricular diastolic parameters are consistent with Grade I diastolic dysfunction (impaired relaxation). Right Ventricle: The right ventricular size is normal. No increase in right ventricular wall thickness. Right ventricular systolic function is normal. Tricuspid regurgitation signal is inadequate for assessing PA pressure. Left Atrium: Left atrial size was severely dilated. Right Atrium: Right atrial size was normal in size. Pericardium: A moderately sized pericardial effusion is present. Mitral Valve: The mitral valve is grossly normal. Trivial mitral valve regurgitation. No evidence of mitral valve stenosis. Tricuspid Valve: The tricuspid valve is normal in structure. Tricuspid valve regurgitation is not demonstrated. No evidence of tricuspid stenosis. Aortic Valve: The aortic valve is tricuspid. Aortic valve regurgitation is not visualized. Pulmonic Valve: The pulmonic valve was normal in structure. Pulmonic valve regurgitation is not visualized. No evidence of pulmonic stenosis. Aorta: Aortic dilatation noted. There is mild dilatation of the aortic root, measuring 44 mm. Venous: The inferior vena cava is normal in  size with greater than 50% respiratory variability, suggesting right atrial pressure of 3 mmHg. IAS/Shunts: No atrial level shunt detected by color flow Doppler.  LEFT VENTRICLE PLAX 2D LVIDd:         4.70 cm   Diastology LVIDs:         3.10 cm   LV e' medial:    3.81 cm/s LV PW:  1.90 cm   LV E/e' medial:  11.1 LV IVS:        2.00 cm   LV e' lateral:   4.57 cm/s LVOT diam:     2.50 cm   LV E/e' lateral: 9.3 LV SV:         104 LV SV Index:   49 LVOT Area:     4.91 cm  RIGHT VENTRICLE RV S prime:     11.70 cm/s TAPSE (M-mode): 2.2 cm LEFT ATRIUM              Index        RIGHT ATRIUM           Index LA diam:        3.90 cm  1.85 cm/m   RA Area:     22.60 cm LA Vol (A2C):   116.0 ml 55.07 ml/m  RA Volume:   71.50 ml  33.94 ml/m LA Vol (A4C):   79.5 ml  37.74 ml/m LA Biplane Vol: 99.2 ml  47.09 ml/m  AORTIC VALVE LVOT Vmax:   100.00 cm/s LVOT Vmean:  64.700 cm/s LVOT VTI:    0.211 m  AORTA Ao Root diam: 4.40 cm MITRAL VALVE MV Area (PHT): 3.17 cm    SHUNTS MV Decel Time: 239 msec    Systemic VTI:  0.21 m MV E velocity: 42.40 cm/s  Systemic Diam: 2.50 cm MV A velocity: 67.00 cm/s MV E/A ratio:  0.63 Rudean Haskell MD Electronically signed by Rudean Haskell MD Signature Date/Time: 04/22/2021/3:07:17 PM    Final    CT HEAD CODE STROKE WO CONTRAST  Result Date: 04/21/2021 CLINICAL DATA:  Code stroke. Neuro deficit, acute, stroke suspected. Sudden onset left-sided arm weakness and drift. EXAM: CT HEAD WITHOUT CONTRAST TECHNIQUE: Contiguous axial images were obtained from the base of the skull through the vertex without intravenous contrast. RADIATION DOSE REDUCTION: This exam was performed according to the departmental dose-optimization program which includes automated exposure control, adjustment of the mA and/or kV according to patient size and/or use of iterative reconstruction technique. COMPARISON:  Head CT 02/12/2021 and MRI 02/13/2021 FINDINGS: Brain: There is no evidence of an acute  infarct, intracranial hemorrhage, mass, midline shift, or extra-axial fluid collection. There is mild cerebral atrophy. Hypodensities in the cerebral white matter bilaterally are unchanged and nonspecific but compatible with mild chronic small vessel ischemic disease. Vascular: Calcified atherosclerosis at the skull base. No hyperdense vessel. Skull: No fracture or suspicious osseous lesion. Sinuses/Orbits: Visualized paranasal sinuses and mastoid air cells are clear. Right cataract extraction. Left ocular prosthesis. Other: None. ASPECTS [facility] Stroke Program Early CT Score) - Ganglionic level infarction (caudate, lentiform nuclei, internal capsule, insula, M1-M3 cortex): 7 - Supraganglionic infarction (M4-M6 cortex): 3 Total score (0-10 with 10 being normal): 10 IMPRESSION: 1. No evidence of acute intracranial abnormality. ASPECTS of 10. 2. Mild chronic small vessel ischemic disease. These results were called by telephone at the time of interpretation on 04/21/2021 at 10:20 am to Dr. Varney Biles, who verbally acknowledged these results. Electronically Signed   By: Logan Bores M.D.   On: 04/21/2021 10:22   CT ANGIO HEAD NECK W WO CM (CODE STROKE)  Result Date: 04/21/2021 CLINICAL DATA:  Neuro deficit, acute, stroke suspected. Left-sided weakness. EXAM: CT ANGIOGRAPHY HEAD AND NECK TECHNIQUE: Multidetector CT imaging of the head and neck was performed using the standard protocol during bolus administration of intravenous contrast. Multiplanar CT image reconstructions and MIPs were obtained to evaluate the vascular  anatomy. Carotid stenosis measurements (when applicable) are obtained utilizing NASCET criteria, using the distal internal carotid diameter as the denominator. RADIATION DOSE REDUCTION: This exam was performed according to the departmental dose-optimization program which includes automated exposure control, adjustment of the mA and/or kV according to patient size and/or use of iterative  reconstruction technique. CONTRAST:  48mL OMNIPAQUE IOHEXOL 350 MG/ML SOLN COMPARISON:  Head MRA 02/13/2021. Carotid Doppler ultrasound 02/13/2021. FINDINGS: CTA NECK FINDINGS Aortic arch: Standard 3 vessel aortic arch. Calcified plaque at the left subclavian artery origin without evidence associated significant stenosis. Right carotid system: Patent with minimal plaque in the distal common carotid artery. No evidence of a significant stenosis or dissection. Left carotid system: Patent with minimal plaque at the carotid bifurcation. No evidence of a significant stenosis or dissection. Vertebral arteries: Patent with the right being moderately dominant. No definite evidence of a flow limiting stenosis or dissection although poor opacification limits assessment. Skeleton: Focally advanced disc degeneration at C3-4, likely with moderate spinal stenosis and moderate left neural foraminal stenosis. Other neck: No evidence of cervical lymphadenopathy or mass. Upper chest: Clear lung apices. Review of the MIP images confirms the above findings CTA HEAD FINDINGS Assessment is limited by poor arterial opacification, with largely nondiagnostic evaluation of the small and medium-sized arteries. Anterior circulation: The intracranial internal carotid arteries are patent with mild atherosclerotic plaque bilaterally. The MCAs are patent through the bifurcations with poor visualization of the branch vessels. The ACAs are also grossly patent proximally. No sizable aneurysm is identified. Posterior circulation: The intracranial vertebral arteries are patent to the basilar with right greater than left V4 segment atherosclerosis. The basilar artery is patent, and the proximal PCAs are grossly patent. No sizable aneurysm is identified. Venous sinuses: Patent. Anatomic variants: None. Review of the MIP images confirms the above findings IMPRESSION: Limited study due to poor arterial opacification as detailed above. No evidence of a  large vessel occlusion through the level of the MCA bifurcations. These results were called by telephone at the time of interpretation on 04/21/2021 at 10:20 am to Dr. Varney Biles, who verbally acknowledged these results. The study was also later discussed by telephone with Dr. Quinn Axe. Electronically Signed   By: Logan Bores M.D.   On: 04/21/2021 10:40    Microbiology: Results for orders placed or performed during the hospital encounter of 04/21/21  Resp Panel by RT-PCR (Flu A&B, Covid) Nasopharyngeal Swab     Status: None   Collection Time: 04/21/21 10:04 AM   Specimen: Nasopharyngeal Swab; Nasopharyngeal(NP) swabs in vial transport medium  Result Value Ref Range Status   SARS Coronavirus 2 by RT PCR NEGATIVE NEGATIVE Final    Comment: (NOTE) SARS-CoV-2 target nucleic acids are NOT DETECTED.  The SARS-CoV-2 RNA is generally detectable in upper respiratory specimens during the acute phase of infection. The lowest concentration of SARS-CoV-2 viral copies this assay can detect is 138 copies/mL. A negative result does not preclude SARS-Cov-2 infection and should not be used as the sole basis for treatment or other patient management decisions. A negative result may occur with  improper specimen collection/handling, submission of specimen other than nasopharyngeal swab, presence of viral mutation(s) within the areas targeted by this assay, and inadequate number of viral copies(<138 copies/mL). A negative result must be combined with clinical observations, patient history, and epidemiological information. The expected result is Negative.  Fact Sheet for Patients:  EntrepreneurPulse.com.au  Fact Sheet for Healthcare Providers:  IncredibleEmployment.be  This test is no t yet approved or  cleared by the [place] and  has been authorized for detection and/or diagnosis of SARS-CoV-2 by FDA under an Emergency Use Authorization (EUA). This EUA will  remain  in effect (meaning this test can be used) for the duration of the COVID-19 declaration under Section 564(b)(1) of the Act, 21 U.S.C.section 360bbb-3(b)(1), unless the authorization is terminated  or revoked sooner.       Influenza A by PCR NEGATIVE NEGATIVE Final   Influenza B by PCR NEGATIVE NEGATIVE Final    Comment: (NOTE) The Xpert Xpress SARS-CoV-2/FLU/RSV plus assay is intended as an aid in the diagnosis of influenza from Nasopharyngeal swab specimens and should not be used as a sole basis for treatment. Nasal washings and aspirates are unacceptable for Xpert Xpress SARS-CoV-2/FLU/RSV testing.  Fact Sheet for Patients: EntrepreneurPulse.com.au  Fact Sheet for Healthcare Providers: IncredibleEmployment.be  This test is not yet approved or cleared by the [place] FDA and has been authorized for detection and/or diagnosis of SARS-CoV-2 by FDA under an Emergency Use Authorization (EUA). This EUA will remain in effect (meaning this test can be used) for the duration of the COVID-19 declaration under Section 564(b)(1) of the Act, 21 U.S.C. section 360bbb-3(b)(1), unless the authorization is terminated or revoked.  Performed at [facility], [address] [place], [place] [address]     Labs: CBC: Recent Labs  Lab 04/21/21 1004 04/22/21 0515 04/23/21 0919  WBC 6.0 9.3 9.6  NEUTROABS 3.8  --  6.2  HGB 11.4* 10.7* 11.5*  HCT 37.5* 36.7* 40.9  MCV 79.1* 80.0 80.0  PLT 156 156 401   Basic Metabolic Panel: Recent Labs  Lab 04/21/21 1004 04/22/21 0515 04/23/21 0919 04/24/21 0826  NA 138 136 137 134*  K 3.7 4.3 5.7* 4.0  CL 103 105 107 101  CO2 23 21* 16* 23  GLUCOSE 91 70 91 87  BUN 28* 43* 62* 40*  CREATININE 6.63* 8.77* 11.23* 7.85*  CALCIUM 8.5* 8.5* 8.7* 8.3*   Liver Function Tests: Recent Labs  Lab 04/21/21 1004 04/23/21 0919  AST 14* 12*  ALT 11 11  ALKPHOS 80 81  BILITOT 0.7 0.6  PROT 6.9 6.6   ALBUMIN 3.3* 3.2*   CBG: Recent Labs  Lab 04/23/21 1117 04/23/21 1617 04/23/21 1700 04/23/21 2228 04/24/21 0809  GLUCAP 74 65* 77 102* 106*    Discharge time spent: greater than 30 minutes.  Signed: Deatra James, MD [facility] 04/24/2021

## 2021-04-24 NOTE — Progress Notes (Signed)
Patient discharged to [facility], report given to [place] RN. Transported by [facility] to awaiting facility. ?

## 2021-04-24 NOTE — Progress Notes (Signed)
Nsg Discharge Note ? ?Admit Date:  04/21/2021 ?Discharge date: 04/24/2021 ?  ?[name]. to be D/C'd Home per MD order.  AVS completed.  Copy for chart, and copy for patient signed, and dated. ?Patient/caregiver able to verbalize understanding. ? ?Discharge Medication: ?Allergies as of 04/24/2021   ? ?   Reactions  ? Levofloxacin Swelling  ? Of face ?Other reaction(s): swelling  ? Promethazine Diarrhea, Other (See Comments)  ? Other reaction(s): Other (See Comments), Other (See Comments) ?Other reaction(s): Muscle cramps ?Muscle cramps ?Cramping ?Other reaction(s): Muscle cramps ?Muscle cramps  ? Other   ? Other reaction(s): Unknown  ? Malt Other (See Comments)  ? Other reaction(s): cramping  ? ?  ? ?  ?Medication List  ?  ? ?TAKE these medications   ? ?amiodarone 200 MG tablet ?Commonly known as: Pacerone ?Take 1 tablet (200 mg total) by mouth daily ?  ?artificial tears Oint ophthalmic ointment ?Commonly known as: LACRILUBE ?Place into the left eye 4 (four) times daily. ?  ?atorvastatin 80 MG tablet ?Commonly known as: LIPITOR ?Take 1 tablet (80 mg total) by mouth daily. ?  ?calcitRIOL 0.5 MCG capsule ?Commonly known as: ROCALTROL ?Take 1 capsule (0.5 mcg total) by mouth Every Tuesday,Thursday,and Saturday with dialysis. ?Start taking on: April 25, 2021 ?  ?carvedilol 25 MG tablet ?Commonly known as: COREG ?Take 1 tablet (25 mg total) by mouth 2 (two) times daily with a meal. ?  ?clopidogrel 75 MG tablet ?Commonly known as: PLAVIX ?Take 1 tablet (75 mg total) by mouth daily. ?  ?dorzolamide-timolol 22.3-6.8 MG/ML ophthalmic solution ?Commonly known as: COSOPT ?Place 1 drop into the right eye 2 (two) times daily. ?  ?Eliquis 5 MG Tabs tablet ?Generic drug: apixaban ?Take 1 tablet (5 mg total) by mouth 2 (two) times daily. ?  ?methocarbamol 500 MG tablet ?Commonly known as: ROBAXIN ?Take 1 tablet (500 mg total) by mouth 3 (three) times daily for 10 days. ?  ?multivitamin with minerals Tabs tablet ?Take 1 tablet  by mouth daily. ?Start taking on: April 25, 2021 ?  ?nitroGLYCERIN 0.4 MG SL tablet ?Commonly known as: NITROSTAT ?Place 1 tablet (0.4 mg total) under the tongue every 5 (five) minutes as needed for chest pain. ?  ?prednisoLONE acetate 1 % ophthalmic suspension ?Commonly known as: PRED FORTE ?Place 1 drop into the left eye every 4 (four) hours. ?  ?tamsulosin 0.4 MG Caps capsule ?Commonly known as: FLOMAX ?Take 0.4 mg by mouth daily after breakfast. ?  ? ?  ? ?  ?  ? ? ?  ?Discharge Care Instructions  ?(From admission, onward)  ?  ? ? ?  ? ?  Start     Ordered  ? 04/24/21 0000  No dressing needed       ? 04/24/21 1027  ? ?  ?  ? ?  ? ? ?Discharge Assessment: ?Vitals:  ? 04/24/21 0504 04/24/21 0956  ?BP: (!) 174/91 (!) 159/84  ?Pulse: 69 66  ?Resp: 17   ?Temp: 98.3 ?F (36.8 ?C)   ?SpO2: 100%   ? Skin clean, dry and intact without evidence of skin break down, no evidence of skin tears noted. ?IV catheter discontinued intact. Site without signs and symptoms of complications - no redness or edema noted at insertion site, patient denies c/o pain - only slight tenderness at site.  Dressing with slight pressure applied. ? ?D/c Instructions-Education: ?Discharge instructions given to patient/family with verbalized understanding. ?D/c education completed with patient/family including follow up instructions,  medication list, d/c activities limitations if indicated, with other d/c instructions as indicated by MD - patient able to verbalize understanding, all questions fully answered. ?Patient instructed to return to ED, call 911, or call MD for any changes in condition.  ?Patient escorted via [place], and D/C home via private auto. ? ?Dorcas Mcmurray, LPN ?02/27/1094 0:45 PM  ?

## 2021-04-24 NOTE — Progress Notes (Signed)
Patient ID: [name]., male   DOB: January 26, 1971, 51 y.o.   MRN: [id number] ?Met with the patient to introduce self and review role, rehab process, team conference and plan of care. Discussed secondary risks including ESRD; HD :T, H, S, HTN, HLD (LDL 69). 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 Incontinent of bowel since CVA; food runs right through him; toileting protocol activated. Reviewed renal /CMM diet; 1200 cc FR. Continue to follow along to discharge to address educational needs and facilitate preparation for discharge. [name] B ? ?

## 2021-04-24 NOTE — TOC Transition Note (Signed)
Transition of Care (TOC) - CM/SW Discharge Note ? ? ?Patient Details  ?Name: [name]. ?MRN: [id number] ?Date of Birth: 07-20-1970 ? ?Transition of Care (TOC) CM/SW Contact:  ?Iona Beard, LCSWA ?Phone Number: ?04/24/2021, 11:02 AM ? ? ?Clinical Narrative:    ?[facility] was able to get auth for pt and can accept for inpatient rehab today. TOC signing off.  ? ?Final next level of care: [place] ?Barriers to Discharge: Barriers Resolved ? ? ?Patient Goals and CMS Choice ?Patient states their goals for this hospitalization and ongoing recovery are:: Go to [facility] ?CMS Medicare.gov Compare Post Acute Care list provided to:: Patient ?Choice offered to / list presented to : Patient ? ?Discharge Placement ?  ?           ?  ?  ?  ?  ? ?Discharge Plan and Services ?  ?  ?           ?  ?  ?  ?  ?  ?  ?  ?  ?  ?  ? ?Social Determinants of Health (SDOH) Interventions ?  ? ? ?Readmission Risk Interventions ?Readmission Risk Prevention Plan 11/27/2020 02/28/2020  ?Transportation Screening Complete Complete  ?PCP or Specialist Appt within 3-5 Days Complete Complete  ?[place] or Home Care Consult Complete Complete  ?Social Work Consult for [place] Planning/Counseling Complete Complete  ?Palliative Care Screening Not Applicable Not Applicable  ?Medication Review Press photographer) Complete Complete  ?Some recent data might be hidden  ? ? ? ? ? ?

## 2021-04-24 NOTE — H&P (Signed)
Physical Medicine and Rehabilitation Admission H&P    Chief Complaint  Patient presents with   Stroke with functional deficits    HPI: [name] is a 50 year old LH-male with history of T2DM with neuropathy, ESRD- HD TTS, gait disorder w/falls, CVA 12/22, A fib who was originally admitted on [facility] from HD center on 04/21/21 with sudden onset of Left sided weakness after syncopal episode post dialysis. MRI brain done revealing patchy acute infarcts in watershed distribution affecting right parietal lobe and cortical infarcts bilateral high frontal lobes and subcentimer acute infarct in callosal splenium on the left. 2D echo done revealing EF 55-60% with no wall abnormality but severe LVH, severe LA dilatation and moderate pericardial effusion.  Patient not TNKase candidate as on eliquis. EEG done negative for seizure activity.   Eliquis/plavix placed on hold till repeat CT head 03/03 to rule out bleed. This was negative for hemorrhagic transformation therefore Eliquis/plavix resumed. He had had frequent BP drops with HD and  coreg changed to metoprolol due to ongoing issues with hypotension. EEG repeated  03/03 due to jerking/spastic episode on right with difficulty breathing yesterday.  EEG was WNL without epileptic discharge. Stroke felt to be secondary to hypoperfusion due to intradialytic hypotensive episode. He has had difficulty tolerating HD and midodrine contraindicated due to prolonged QT. and Dr. Clover Mealy questioned transition to PD. Currently complains of loose stool.   Review of Systems  Constitutional:  Negative for chills and fever.  HENT:  Negative for hearing loss and tinnitus.   Eyes:  Negative for pain.       Left eye enucleation.   Respiratory:  Negative for cough and shortness of breath.   Cardiovascular:  Negative for chest pain and leg swelling.  Gastrointestinal:  Positive for diarrhea. Negative for abdominal pain and heartburn.  Genitourinary:  Negative for  dysuria and urgency.  Musculoskeletal:  Positive for joint pain (bilateral hips).  Skin:  Negative for rash.  Neurological:  Positive for sensory change and weakness. Negative for dizziness and headaches.  Psychiatric/Behavioral:  Positive for memory loss. The patient has insomnia (can't get comfortable.).     Past Medical History:  Diagnosis Date   Anxiety    Cellulitis    CHF (congestive heart failure) (HCC)    Chronic diarrhea    Chronic kidney disease    Chronic pain of both ankles    Diabetes mellitus without complication ([place])    Diabetes mellitus, type II ([place])    Diabetic neuropathy (HCC)    Esophagitis    Frequent falls    Gait instability    Gastritis    GERD (gastroesophageal reflux disease)    H/O enucleation of left eyeball    Heart palpitations    Hemodialysis patient ([place])    History of kidney stones    HLD (hyperlipidemia)    Hypertension    Insomnia    Nausea and vomiting in adult    recurrent    Past Surgical History:  Procedure Laterality Date   AMPUTATION Left 03/16/2016   Procedure: AMPUTATION DIGIT LEFT HALLUX;  Surgeon: Trula Slade, DPM;  Location: [place];  Service: Podiatry;  Laterality: Left;  can start around 5    AMPUTATION Right 02/09/2021   Procedure: AMPUTATION DIGIT;  Surgeon: Sherilyn Cooter, MD;  Location: [place];  Service: Orthopedics;  Laterality: Right;   ARTHROSCOPIC REPAIR ACL Left    AV FISTULA PLACEMENT Right 05/31/2019   Procedure: Brachiocephalic AV fistula creation;  Surgeon:  Algernon Huxley, MD;  Location: [name];  Service: Vascular;  Laterality: Right;   COLONOSCOPY WITH PROPOFOL N/A 10/28/2015   Procedure: COLONOSCOPY WITH PROPOFOL;  Surgeon: Lollie Sails, MD;  Location: [facility] ENDOSCOPY;  Service: Endoscopy;  Laterality: N/A;   COLONOSCOPY WITH PROPOFOL N/A 10/29/2015   Procedure: COLONOSCOPY WITH PROPOFOL;  Surgeon: Lollie Sails, MD;  Location: [facility] ENDOSCOPY;  Service: Endoscopy;  Laterality: N/A;   CORONARY/GRAFT  ACUTE MI REVASCULARIZATION N/A 02/26/2020   Procedure: Coronary/Graft Acute MI Revascularization;  Surgeon: Jettie Booze, MD;  Location: [place] [facility];  Service: Cardiovascular;  Laterality: N/A;   DIALYSIS/PERMA CATHETER INSERTION Right 04/26/2019   Perm Cath    DIALYSIS/PERMA CATHETER INSERTION N/A 04/26/2019   Procedure: DIALYSIS/PERMA CATHETER INSERTION;  Surgeon: Algernon Huxley, MD;  Location: [place] [facility];  Service: Cardiovascular;  Laterality: N/A;   DIALYSIS/PERMA CATHETER REMOVAL N/A 08/15/2019   Procedure: DIALYSIS/PERMA CATHETER REMOVAL;  Surgeon: Katha Cabal, MD;  Location: [place] [facility];  Service: Cardiovascular;  Laterality: N/A;   EMBOLIZATION Right 02/06/2021   Procedure: EMBOLIZATION;  Surgeon: Algernon Huxley, MD;  Location: [place] [facility];  Service: Cardiovascular;  Laterality: Right;  Right Upper Extremity Dialysis Access, Permcath Placement   ESOPHAGOGASTRODUODENOSCOPY (EGD) WITH PROPOFOL N/A 12/27/2017   Procedure: ESOPHAGOGASTRODUODENOSCOPY (EGD) WITH PROPOFOL;  Surgeon: Tol[place], Benay Pike, MD;  Location: [name] ENDOSCOPY;  Service: Gastroenterology;  Laterality: N/A;   INTRAVASCULAR ULTRASOUND/IVUS N/A 02/26/2020   Procedure: Intravascular Ultrasound/IVUS;  Surgeon: Jettie Booze, MD;  Location: [place] [facility];  Service: Cardiovascular;  Laterality: N/A;   LEFT HEART CATH AND CORONARY ANGIOGRAPHY N/A 02/26/2020   Procedure: LEFT HEART CATH AND CORONARY ANGIOGRAPHY;  Surgeon: Jettie Booze, MD;  Location: [place] [facility];  Service: Cardiovascular;  Laterality: N/A;   PROSTATE SURGERY  2016   TONSILECTOMY/ADENOIDECTOMY WITH MYRINGOTOMY     TONSILLECTOMY     UPPER EXTREMITY ANGIOGRAPHY Right 11/26/2020   Procedure: Upper Extremity Angiography;  Surgeon: Algernon Huxley, MD;  Location: [place] [facility];  Service: Cardiovascular;  Laterality: Right;   UPPER EXTREMITY ANGIOGRAPHY Right 02/05/2021   Procedure: UPPER EXTREMITY  ANGIOGRAPHY;  Surgeon: Algernon Huxley, MD;  Location: [place] [facility];  Service: Cardiovascular;  Laterality: Right;    Family History  Problem Relation Age of Onset   CAD Father    Stroke Father    Diabetes Mellitus II Mother    Kidney failure Mother    Schizophrenia Mother     Social History: Married. Used to work in Biomedical scientist. He reports that he has never smoked. He has never used smokeless tobacco. He reports that he does not drink alcohol and does not use drugs.   Allergies  Allergen Reactions   Levofloxacin Swelling    Facial swelling   Promethazine Diarrhea and Other (See Comments)    Muscle cramps   Malt Other (See Comments)    Cramping    Medications Prior to Admission  Medication Sig Dispense Refill   amiodarone (PACERONE) 200 MG tablet Take 1 tablet (200 mg total) by mouth daily 30 tablet 1   apixaban (ELIQUIS) 5 MG TABS tablet Take 1 tablet (5 mg total) by mouth 2 (two) times daily. 60 tablet 0   atorvastatin (LIPITOR) 80 MG tablet Take 1 tablet (80 mg total) by mouth daily. 90 tablet 3   carvedilol (COREG) 25 MG tablet Take 1 tablet (25 mg total) by mouth 2 (two) times daily with  a meal. 60 tablet 1   clopidogrel (PLAVIX) 75 MG tablet Take 1 tablet (75 mg total) by mouth daily. 90 tablet 3   dorzolamide-timolol (COSOPT) 22.3-6.8 MG/ML ophthalmic solution Place 1 drop into the right eye 2 (two) times daily.     [START ON 04/25/2021] Multiple Vitamin (MULTIVITAMIN WITH MINERALS) TABS tablet Take 1 tablet by mouth daily. 30 tablet 0   nitroGLYCERIN (NITROSTAT) 0.4 MG SL tablet Place 1 tablet (0.4 mg total) under the tongue every 5 (five) minutes as needed for chest pain.     tamsulosin (FLOMAX) 0.4 MG CAPS capsule Take 0.4 mg by mouth daily after breakfast.      [START ON 04/25/2021] calcitRIOL (ROCALTROL) 0.5 MCG capsule Take 1 capsule (0.5 mcg total) by mouth Every Tuesday,Thursday,and Saturday with dialysis. 12 capsule 0   methocarbamol (ROBAXIN) 500 MG tablet Take  1 tablet (500 mg total) by mouth 3 (three) times daily for 10 days. 30 tablet 0   Home: Home Living Family/patient expects to be discharged to:: Private residence Living Arrangements: Spouse/significant other Available Help at Discharge: Family, Available PRN/intermittently Type of Home: House Home Access: Ramped entrance Home Layout: One level Bathroom Shower/Tub: Multimedia programmer: Handicapped height Bathroom Accessibility: Yes Home Equipment: Conservation officer, nature (2 wheels), Other (comment), BSC/3in1 Additional Comments: Pt reported no change in prior living set up since last admission.   Functional History: Prior Function Prior Level of Function : Needs assist Physical Assist : Mobility (physical), ADLs (physical) Mobility (physical): Bed mobility, Transfers, Gait, Stairs Mobility Comments: Pt ambulates with cane in community. No AD use within house. ADLs Comments: Pt reportedly is indepdnent for ADL's and IADL's at baseline.   Functional Status:  Mobility: Bed Mobility Overal bed mobility: Needs Assistance Bed Mobility: Supine to Sit Supine to sit: Mod assist General bed mobility comments: slow labored movement with limited use of LUE due to weakness Transfers Overall transfer level: Needs assistance Equipment used: Rolling walker (2 wheels) Transfers: Sit to/from Stand, Bed to chair/wheelchair/BSC Sit to Stand: Mod assist Bed to/from chair/wheelchair/BSC transfer type:: Step pivot Step pivot transfers: Mod assist, Max assist General transfer comment: very unsteady on feet and limited mostly due to jerking/spastic like movement on right side Ambulation/Gait Ambulation/Gait assistance: Mod assist, Max assist Gait Distance (Feet): 6 Feet Assistive device: Rolling walker (2 wheels) Gait Pattern/deviations: Decreased step length - right, Decreased step length - left, Decreased stride length, Decreased stance time - left, Ataxic General Gait Details: very unstead on  feet with ataxic like movement of BLE due to weakness and unable to ambulate away from bedside due to spastic like movement of right side Gait velocity: decreased   ADL: ADL Overall ADL's : Needs assistance/impaired Eating/Feeding: Sitting, Minimal assistance, Set up Grooming: Sitting, Moderate assistance Upper Body Bathing: Moderate assistance, Sitting Lower Body Bathing: Moderate assistance, Maximal assistance, Sitting/lateral leans Upper Body Dressing : Moderate assistance, Sitting Lower Body Dressing: Moderate assistance, Maximal assistance, Sitting/lateral leans Toilet Transfer: Maximal assistance, Rolling walker (2 wheels), Stand-pivot Toilet Transfer Details (indicate cue type and reason): Simulated via EOB to chair with pt noting to have muscle spasms and poor balance in standing. Toileting- Clothing Manipulation and Hygiene: Maximal assistance, Total assistance, Bed level Tub/ Shower Transfer: Maximal assistance, Stand-pivot, Tub bench Functional mobility during ADLs: Maximal assistance, Rolling walker (2 wheels), +2 for physical assistance General ADL Comments: Pt limited by L side weakness and poor balance in standing. Pt limited in ADL's due to minimal functional use of L UE.  Cognition: Cognition Overall Cognitive Status: Within Functional Limits for tasks assessed Orientation Level: Oriented X4 Cognition Arousal/Alertness: Awake/alert Behavior During Therapy: WFL for tasks assessed/performed Overall Cognitive Status: Within Functional Limits for tasks assessed     Height 6\' 3"  (1.905 m), weight 81.4 kg. Physical Exam Vitals and nursing note reviewed.  Constitutional:      Comments: Left ptosis  Eyes:     Comments: Left ptosis due to enucleation.   Cardiac: RRR Pulmonary: Breathing comfortably on room air.  Musculoskeletal:     Comments: Old Right 4th finger and left great toe amputation site well healed.   Skin:    Comments: Multiple healed abrasions bilateral  shins and left ankle.   Neurological:     Mental Status: He is alert and oriented to person, place, and time.     Comments: Mild left facial droop with clear speech. Had difficulty recalling details of medical history. Left sided weakness--UE>LE. LUE 0/5 except for 2/5 EE, 3/5 WE and hand grip. LLE: 4/5 strength throughout  Results for orders placed or performed during the hospital encounter of 04/21/21 (from the past 48 hour(s))  Glucose, capillary     Status: None   Collection Time: 04/22/21  4:05 PM  Result Value Ref Range   Glucose-Capillary 89 70 - 99 mg/dL    Comment: Glucose reference range applies only to samples taken after fasting for at least 8 hours.  Glucose, capillary     Status: Abnormal   Collection Time: 04/22/21  9:38 PM  Result Value Ref Range   Glucose-Capillary 102 (H) 70 - 99 mg/dL    Comment: Glucose reference range applies only to samples taken after fasting for at least 8 hours.  Glucose, capillary     Status: None   Collection Time: 04/23/21  8:10 AM  Result Value Ref Range   Glucose-Capillary 82 70 - 99 mg/dL    Comment: Glucose reference range applies only to samples taken after fasting for at least 8 hours.  Glucose, capillary     Status: None   Collection Time: 04/23/21  9:09 AM  Result Value Ref Range   Glucose-Capillary 95 70 - 99 mg/dL    Comment: Glucose reference range applies only to samples taken after fasting for at least 8 hours.  Comprehensive metabolic panel     Status: Abnormal   Collection Time: 04/23/21  9:19 AM  Result Value Ref Range   Sodium 137 135 - 145 mmol/L   Potassium 5.7 (H) 3.5 - 5.1 mmol/L    Comment: DELTA CHECK NOTED   Chloride 107 98 - 111 mmol/L   CO2 16 (L) 22 - 32 mmol/L   Glucose, Bld 91 70 - 99 mg/dL    Comment: Glucose reference range applies only to samples taken after fasting for at least 8 hours.   BUN 62 (H) 6 - 20 mg/dL   Creatinine, Ser 11.23 (H) 0.61 - 1.24 mg/dL   Calcium 8.7 (L) 8.9 - 10.3 mg/dL   Total  Protein 6.6 6.5 - 8.1 g/dL   Albumin 3.2 (L) 3.5 - 5.0 g/dL   AST 12 (L) 15 - 41 U/L   ALT 11 0 - 44 U/L   Alkaline Phosphatase 81 38 - 126 U/L   Total Bilirubin 0.6 0.3 - 1.2 mg/dL   GFR, Estimated 5 (L) >60 mL/min    Comment: (NOTE) Calculated using the CKD-EPI Creatinine Equation (2021)    Anion gap 14 5 - 15    Comment: Performed at  [facility], [address] [place], [place] [address]  CBC with Differential/Platelet     Status: Abnormal   Collection Time: 04/23/21  9:19 AM  Result Value Ref Range   WBC 9.6 4.0 - 10.5 K/uL   RBC 5.11 4.22 - 5.81 MIL/uL   Hemoglobin 11.5 (L) 13.0 - 17.0 g/dL   HCT 40.9 39.0 - 52.0 %   MCV 80.0 80.0 - 100.0 fL   MCH 22.5 (L) 26.0 - 34.0 pg   MCHC 28.1 (L) 30.0 - 36.0 g/dL   RDW 19.8 (H) 11.5 - 15.5 %   Platelets 171 150 - 400 K/uL   nRBC 0.0 0.0 - 0.2 %   Neutrophils Relative % 65 %   Neutro Abs 6.2 1.7 - 7.7 K/uL   Lymphocytes Relative 17 %   Lymphs Abs 1.6 0.7 - 4.0 K/uL   Monocytes Relative 11 %   Monocytes Absolute 1.1 (H) 0.1 - 1.0 K/uL   Eosinophils Relative 6 %   Eosinophils Absolute 0.6 (H) 0.0 - 0.5 K/uL   Basophils Relative 1 %   Basophils Absolute 0.1 0.0 - 0.1 K/uL   Immature Granulocytes 0 %   Abs Immature Granulocytes 0.04 0.00 - 0.07 K/uL    Comment: Performed at [facility], [address] [place], [place] [address]  Prolactin     Status: Abnormal   Collection Time: 04/23/21  9:19 AM  Result Value Ref Range   Prolactin 331.0 (H) 4.0 - 15.2 ng/mL    Comment: (NOTE) Performed At: [facility] [place], [place] [id number] Rush Farmer MD EL:[id number]   Glucose, capillary     Status: None   Collection Time: 04/23/21 11:17 AM  Result Value Ref Range   Glucose-Capillary 74 70 - 99 mg/dL    Comment: Glucose reference range applies only to samples taken after fasting for at least 8 hours.  Glucose, capillary     Status: Abnormal   Collection Time: 04/23/21  4:17 PM  Result Value Ref Range    Glucose-Capillary 65 (L) 70 - 99 mg/dL    Comment: Glucose reference range applies only to samples taken after fasting for at least 8 hours.  Glucose, capillary     Status: None   Collection Time: 04/23/21  5:00 PM  Result Value Ref Range   Glucose-Capillary 77 70 - 99 mg/dL    Comment: Glucose reference range applies only to samples taken after fasting for at least 8 hours.   Comment 1 Notify RN    Comment 2 Document in Chart   Glucose, capillary     Status: Abnormal   Collection Time: 04/23/21 10:28 PM  Result Value Ref Range   Glucose-Capillary 102 (H) 70 - 99 mg/dL    Comment: Glucose reference range applies only to samples taken after fasting for at least 8 hours.  Glucose, capillary     Status: Abnormal   Collection Time: 04/24/21  8:09 AM  Result Value Ref Range   Glucose-Capillary 106 (H) 70 - 99 mg/dL    Comment: Glucose reference range applies only to samples taken after fasting for at least 8 hours.  Basic metabolic panel     Status: Abnormal   Collection Time: 04/24/21  8:26 AM  Result Value Ref Range   Sodium 134 (L) 135 - 145 mmol/L   Potassium 4.0 3.5 - 5.1 mmol/L    Comment: DELTA CHECK NOTED   Chloride 101 98 - 111 mmol/L   CO2 23 22 - 32  mmol/L   Glucose, Bld 87 70 - 99 mg/dL    Comment: Glucose reference range applies only to samples taken after fasting for at least 8 hours.   BUN 40 (H) 6 - 20 mg/dL   Creatinine, Ser 7.85 (H) 0.61 - 1.24 mg/dL   Calcium 8.3 (L) 8.9 - 10.3 mg/dL   GFR, Estimated 8 (L) >60 mL/min    Comment: (NOTE) Calculated using the CKD-EPI Creatinine Equation (2021)    Anion gap 10 5 - 15    Comment: Performed at [facility], [address] [place], [place] [address]  Glucose, capillary     Status: None   Collection Time: 04/24/21 11:53 AM  Result Value Ref Range   Glucose-Capillary 97 70 - 99 mg/dL    Comment: Glucose reference range applies only to samples taken after fasting for at least 8 hours.   CT HEAD WO CONTRAST  (5MM)  Result Date: 04/24/2021 CLINICAL DATA:  51 year old male with neurologic deficit, patchy bilateral superior cortical infarcts on recent MRI. EXAM: CT HEAD WITHOUT CONTRAST TECHNIQUE: Contiguous axial images were obtained from the base of the skull through the vertex without intravenous contrast. RADIATION DOSE REDUCTION: This exam was performed according to the departmental dose-optimization program which includes automated exposure control, adjustment of the mA and/or kV according to patient size and/or use of iterative reconstruction technique. COMPARISON:  Brain MRI 04/21/2021.  Head CT 04/21/2021. FINDINGS: Brain: Bilateral superior frontal gyrus and right posterior parietal cortical infarcts seen recently by MRI remain occult largely occult by CT (series 2, image 23). No associated hemorrhage or mass effect. Stable gray-white matter differentiation elsewhere. The no superimposed midline shift, ventriculomegaly, mass effect, evidence of mass lesion, intracranial hemorrhage or new cortically based infarct. Vascular: Calcified atherosclerosis at the skull base. No suspicious intracranial vascular hyperdensity. Skull: No acute osseous abnormality identified. Sinuses/Orbits: Visualized paranasal sinuses and mastoids are stable and well aerated. Other: Widespread calcified scalp vessel atherosclerosis. Left globe prosthesis. No acute orbit or scalp squamous that suboccipital scalp soft tissue scarring. No acute orbit or scalp soft tissue finding. IMPRESSION: 1. Bilateral patchy cortical infarcts primarily at the vertex last month remain largely occult by CT. No hemorrhagic transformation or mass effect. 2. No new intracranial abnormality. 3. Extensive calcified scalp vessel atherosclerosis, most commonly observed with End stage renal disease. Electronically Signed   By: Genevie [name] M.D.   On: 04/24/2021 08:14   EEG adult  Result Date: 04/24/2021 Lora Havens, MD     04/24/2021  9:48 AM Patient Name:  [name] MRN: [id number] Epilepsy Attending: Lora Havens Referring Physician/Provider: Deatra James, MD Date: 04/24/2021 Duration: 22.40 mins Patient history: 51 year old male with seizure-like episode.  EEG to evaluate for seizures. Level of alertness: Awake, asleep AEDs during EEG study: None Technical aspects: This EEG study was done with scalp electrodes positioned according to the 10-20 International system of electrode placement. Electrical activity was acquired at a sampling rate of 500Hz  and reviewed with a high frequency filter of 70Hz  and a low frequency filter of 1Hz . EEG data were recorded continuously and digitally stored. Description: The posterior dominant rhythm consists of 9 Hz activity of moderate voltage (25-35 uV) seen predominantly in posterior head regions, symmetric and reactive to eye opening and eye closing. Sleep was characterized by vertex waves, sleep spindles (12 to 14 Hz), maximal frontocentral region. Hyperventilation and photic stimulation were not performed.   IMPRESSION: This study is within normal limits. No seizures or epileptiform discharges  were seen throughout the recording. Priyanka O Yadav      Height 6\' 3"  (1.905 m), weight 81.4 kg.  Medical Problem List and Plan: 1. Functional deficits secondary to cerebral ischemic stroke, bilateral due to global hypoperfusion  -patient may shower  -ELOS/Goals: 10-14 days S  -Admit to CIR 2.  Antithrombotics: -DVT/anticoagulation:  Pharmaceutical: Other (comment)--resume Eliquis  -antiplatelet therapy: Resume Plavix 3. Pain Management: Tylenol prn.  4. Mood: LCSW to follow for evaluation and support.   -antipsychotic agents: NA 5. Neuropsych: This patient may be capable of making decisions on his own behalf. 6. Skin/Wound Care:  Routine pressure relief measures.  7. Fluids/Electrolytes/Nutrition: Strict I/O --not on any fluid restriction at this time.  8. Hypertension: Monitor for  orthostatic/syncopal episodes with increase in activity.  --continue metoprolol bid  9. CAF: Monitor HR TID 10. T2DM w/neuropathy and gastroparesis: Not on any meds as A1C normalized-4.7.   --BS have been well controlled. Will check once a day.   11. H/o GERD w/gastritis/esophagitis: 12. ESRD: Continue HD TTS at the end of the day to help with tolerance of therapy.  -- on phoslo and calcitriol for metabolic bone disese.  --not in favor of transitioning to PD. Plans for graft in LUE? 13. Anemia of chronic disease: Stable w/ Hgb @ 11.5 on 03/02. Repeat Hgb tomorrow.  14. Glaucoma: Resume cosopt eye drops.  13. Diarrhea: will add fiber for bulking.   I have personally performed a face to face diagnostic evaluation, including, but not limited to relevant history and physical exam findings, of this patient and developed relevant assessment and plan.  Additionally, I have reviewed and concur with the physician assistant's documentation above.  Bary Leriche, PA-C   Izora Ribas, MD 04/24/2021

## 2021-04-24 NOTE — Progress Notes (Signed)
Patient  transferred to unit from [facility] via stretcher. Denies pain or distress. Patient oriented to unit and safety. Call bell within reach and bed in lowest position. ?

## 2021-04-24 NOTE — Progress Notes (Signed)
Inpatient Rehabilitation Admission Medication Review by a Pharmacist ? ?A complete drug regimen review was completed for this patient to identify any potential clinically significant medication issues. ? ?High Risk Drug Classes Is patient taking? Indication by Medication  ?Antipsychotic No   ?Anticoagulant Yes Apixaban- AF  ?Antibiotic No   ?Opioid No   ?Antiplatelet Yes Plavix- CVA prophylaxis  ?Hypoglycemics/insulin Yes iSS- T2DM  ?Vasoactive Medication Yes Amiodarone, lopressor- arrhythmia, rate control  ?Chemotherapy No   ?Other Yes Lipitor- HLD ?Rocaltrol- secondary hyperparathyroidism 2/2 ESRD  ? ? ? ?Type of Medication Issue Identified Description of Issue Recommendation(s)  ?Drug Interaction(s) (clinically significant) ?    ?Duplicate Therapy ?    ?Allergy ?    ?No Medication Administration End Date ?    ?Incorrect Dose ?    ?Additional Drug Therapy Needed ?    ?Significant med changes from prior encounter (inform family/care partners about these prior to discharge).    ?Other ? PTA meds: ?nitroSL ?Flomax Restart PTA meds if and when clinically necessary during CIR admission or at time of discharge, if warranted  ? ? ?Clinically significant medication issues were identified that warrant physician communication and completion of prescribed/recommended actions by midnight of the next day:  No ? ?Time spent performing this drug regimen review (minutes):  30 ? ? ?Tishanna Dunford BS, PharmD, BCPS ?Clinical Pharmacist ?04/24/2021 2:27 PM ? ? ? ? ?

## 2021-04-24 NOTE — Procedures (Signed)
Patient Name: [name].  ?MRN: [id number]  ?Epilepsy Attending: Lora Havens  ?Referring Physician/Provider: Deatra James, MD ?Date: 04/24/2021 ?Duration: 22.40 mins ? ?Patient history: 51 year old male with seizure-like episode.  EEG to evaluate for seizures. ? ?Level of alertness: Awake, asleep ? ?AEDs during EEG study: None ? ?Technical aspects: This EEG study was done with scalp electrodes positioned according to the 10-20 International system of electrode placement. Electrical activity was acquired at a sampling rate of 500Hz  and reviewed with a high frequency filter of 70Hz  and a low frequency filter of 1Hz . EEG data were recorded continuously and digitally stored.  ? ?Description: The posterior dominant rhythm consists of 9 Hz activity of moderate voltage (25-35 uV) seen predominantly in posterior head regions, symmetric and reactive to eye opening and eye closing. Sleep was characterized by vertex waves, sleep spindles (12 to 14 Hz), maximal frontocentral region. Hyperventilation and photic stimulation were not performed.    ? ?IMPRESSION: ?This study is within normal limits. No seizures or epileptiform discharges were seen throughout the recording. ? ?Lora Havens  ? ?

## 2021-04-24 NOTE — Progress Notes (Signed)
EEG completed, results pending. 

## 2021-04-24 NOTE — Progress Notes (Signed)
Patient ID: [name]., male   DOB: 10/25/1970, 51 y.o.   MRN: [id number]  S: Had HD last night-  uncooperative it sounds-  BP did drop to 115/75 really without UF -  EEG done-  no further documented jerking -  he says better after a muscle relaxer    O:BP (!) 174/91 (BP Location: Left Arm)    Pulse 69    Temp 98.3 F (36.8 C) (Oral)    Resp 17    Ht 6\' 3"  (1.905 m)    Wt 81.6 kg    SpO2 100%    BMI 22.49 kg/m   Intake/Output Summary (Last 24 hours) at 04/24/2021 0912 Last data filed at 04/23/2021 2130 Gross per 24 hour  Intake 600 ml  Output -155 ml  Net 755 ml   Intake/Output: I/O last 3 completed shifts: In: 840 [P.O.:840] Out: -155 [Stool:2]  Intake/Output this shift:  No intake/output data recorded. Weight change:  Gen:NAD CVS: RRR Resp:CTA Abd: +BS, soft, NT/ND Ext: no edema  Recent Labs  Lab 04/21/21 1004 04/22/21 0515 04/23/21 0919 04/24/21 0826  NA 138 136 137 134*  K 3.7 4.3 5.7* 4.0  CL 103 105 107 101  CO2 23 21* 16* 23  GLUCOSE 91 70 91 87  BUN 28* 43* 62* 40*  CREATININE 6.63* 8.77* 11.23* 7.85*  ALBUMIN 3.3*  --  3.2*  --   CALCIUM 8.5* 8.5* 8.7* 8.3*  AST 14*  --  12*  --   ALT 11  --  11  --    Liver Function Tests: Recent Labs  Lab 04/21/21 1004 04/23/21 0919  AST 14* 12*  ALT 11 11  ALKPHOS 80 81  BILITOT 0.7 0.6  PROT 6.9 6.6  ALBUMIN 3.3* 3.2*   No results for input(s): LIPASE, AMYLASE in the last 168 hours. No results for input(s): AMMONIA in the last 168 hours. CBC: Recent Labs  Lab 04/21/21 1004 04/22/21 0515 04/23/21 0919  WBC 6.0 9.3 9.6  NEUTROABS 3.8  --  6.2  HGB 11.4* 10.7* 11.5*  HCT 37.5* 36.7* 40.9  MCV 79.1* 80.0 80.0  PLT 156 156 171   Cardiac Enzymes: No results for input(s): CKTOTAL, CKMB, CKMBINDEX, TROPONINI in the last 168 hours. CBG: Recent Labs  Lab 04/23/21 1117 04/23/21 1617 04/23/21 1700 04/23/21 2228 04/24/21 0809  GLUCAP 74 65* 77 102* 106*    Iron Studies: No results for input(s):  IRON, TIBC, TRANSFERRIN, FERRITIN in the last 72 hours. Studies/Results: CT HEAD WO CONTRAST (5MM)  Result Date: 04/24/2021 CLINICAL DATA:  51 year old male with neurologic deficit, patchy bilateral superior cortical infarcts on recent MRI. EXAM: CT HEAD WITHOUT CONTRAST TECHNIQUE: Contiguous axial images were obtained from the base of the skull through the vertex without intravenous contrast. RADIATION DOSE REDUCTION: This exam was performed according to the departmental dose-optimization program which includes automated exposure control, adjustment of the mA and/or kV according to patient size and/or use of iterative reconstruction technique. COMPARISON:  Brain MRI 04/21/2021.  Head CT 04/21/2021. FINDINGS: Brain: Bilateral superior frontal gyrus and right posterior parietal cortical infarcts seen recently by MRI remain occult largely occult by CT (series 2, image 23). No associated hemorrhage or mass effect. Stable gray-white matter differentiation elsewhere. The no superimposed midline shift, ventriculomegaly, mass effect, evidence of mass lesion, intracranial hemorrhage or new cortically based infarct. Vascular: Calcified atherosclerosis at the skull base. No suspicious intracranial vascular hyperdensity. Skull: No acute osseous abnormality identified. Sinuses/Orbits: Visualized paranasal sinuses and mastoids  are stable and well aerated. Other: Widespread calcified scalp vessel atherosclerosis. Left globe prosthesis. No acute orbit or scalp squamous that suboccipital scalp soft tissue scarring. No acute orbit or scalp soft tissue finding. IMPRESSION: 1. Bilateral patchy cortical infarcts primarily at the vertex last month remain largely occult by CT. No hemorrhagic transformation or mass effect. 2. No new intracranial abnormality. 3. Extensive calcified scalp vessel atherosclerosis, most commonly observed with End stage renal disease. Electronically Signed   By: Genevie [name] M.D.   On: 04/24/2021 08:14    ECHOCARDIOGRAM COMPLETE  Result Date: 04/22/2021    ECHOCARDIOGRAM REPORT   Patient Name:   [name]. Date of Exam: 04/22/2021 Medical Rec #:  [id number]            Height:       75.0 in Accession #:    [id number]           Weight:       181.7 lb Date of Birth:  04-07-1970             BSA:          2.106 m Patient Age:    44 years             BP:           122/78 mmHg Patient Gender: M                    HR:           91 bpm. Exam Location:  [facility] Procedure: 2D Echo, Cardiac Doppler and Color Doppler Indications:    Stroke I63.9  History:        Patient has prior history of Echocardiogram examinations, most                 recent 11/25/2020. CHF, Previous Myocardial Infarction; Risk                 Factors:Hypertension, Diabetes and Dyslipidemia. CAD/ H/o prior                 STEMI 02/2020 s/p DES to LAD, ESRD on hemodialysis [facility]), CVA                 (cerebral vascular accident) ([place]).  Sonographer:    Alvino Chapel RCS Referring Phys: [phone] [employer] IMPRESSIONS  1. Left ventricular ejection fraction, by estimation, is 55 to 60%. The left ventricle has normal function. The left ventricle has no regional wall motion abnormalities. There is severe concentric left ventricular hypertrophy. Left ventricular diastolic  parameters are consistent with Grade I diastolic dysfunction (impaired relaxation).  2. Right ventricular systolic function is normal. The right ventricular size is normal. Tricuspid regurgitation signal is inadequate for assessing PA pressure.  3. Left atrial size was severely dilated.  4. Moderate pericardial effusion.  5. The mitral valve is grossly normal. Trivial mitral valve regurgitation. No evidence of mitral stenosis.  6. The aortic valve is tricuspid. Aortic valve regurgitation is not visualized.  7. Aortic dilatation noted. There is mild dilatation of the aortic root, measuring 44 mm.  8. The inferior vena cava is normal in size with greater than 50% respiratory  variability, suggesting right atrial pressure of 3 mmHg. Comparison(s): Improvement in pericardial effusion. Conclusion(s)/Recommendation(s): There is severe LVH, biatrial enlargement, pericardial effusion, and apical sparing pattern on prior strain imaging. Consider outpatient Pyrophosphate imaging for amyloidosis if clinically indicated. FINDINGS  Left Ventricle: Left ventricular ejection fraction, by  estimation, is 55 to 60%. The left ventricle has normal function. The left ventricle has no regional wall motion abnormalities. The left ventricular internal cavity size was normal in size. There is  severe concentric left ventricular hypertrophy. Left ventricular diastolic parameters are consistent with Grade I diastolic dysfunction (impaired relaxation). Right Ventricle: The right ventricular size is normal. No increase in right ventricular wall thickness. Right ventricular systolic function is normal. Tricuspid regurgitation signal is inadequate for assessing PA pressure. Left Atrium: Left atrial size was severely dilated. Right Atrium: Right atrial size was normal in size. Pericardium: A moderately sized pericardial effusion is present. Mitral Valve: The mitral valve is grossly normal. Trivial mitral valve regurgitation. No evidence of mitral valve stenosis. Tricuspid Valve: The tricuspid valve is normal in structure. Tricuspid valve regurgitation is not demonstrated. No evidence of tricuspid stenosis. Aortic Valve: The aortic valve is tricuspid. Aortic valve regurgitation is not visualized. Pulmonic Valve: The pulmonic valve was normal in structure. Pulmonic valve regurgitation is not visualized. No evidence of pulmonic stenosis. Aorta: Aortic dilatation noted. There is mild dilatation of the aortic root, measuring 44 mm. Venous: The inferior vena cava is normal in size with greater than 50% respiratory variability, suggesting right atrial pressure of 3 mmHg. IAS/Shunts: No atrial level shunt detected by color  flow Doppler.  LEFT VENTRICLE PLAX 2D LVIDd:         4.70 cm   Diastology LVIDs:         3.10 cm   LV e' medial:    3.81 cm/s LV PW:         1.90 cm   LV E/e' medial:  11.1 LV IVS:        2.00 cm   LV e' lateral:   4.57 cm/s LVOT diam:     2.50 cm   LV E/e' lateral: 9.3 LV SV:         104 LV SV Index:   49 LVOT Area:     4.91 cm  RIGHT VENTRICLE RV S prime:     11.70 cm/s TAPSE (M-mode): 2.2 cm LEFT ATRIUM              Index        RIGHT ATRIUM           Index LA diam:        3.90 cm  1.85 cm/m   RA Area:     22.60 cm LA Vol (A2C):   116.0 ml 55.07 ml/m  RA Volume:   71.50 ml  33.94 ml/m LA Vol (A4C):   79.5 ml  37.74 ml/m LA Biplane Vol: 99.2 ml  47.09 ml/m  AORTIC VALVE LVOT Vmax:   100.00 cm/s LVOT Vmean:  64.700 cm/s LVOT VTI:    0.211 m  AORTA Ao Root diam: 4.40 cm MITRAL VALVE MV Area (PHT): 3.17 cm    SHUNTS MV Decel Time: 239 msec    Systemic VTI:  0.21 m MV E velocity: 42.40 cm/s  Systemic Diam: 2.50 cm MV A velocity: 67.00 cm/s MV E/A ratio:  0.63 Rudean Haskell MD Electronically signed by Rudean Haskell MD Signature Date/Time: 04/22/2021/3:07:17 PM    Final     amiodarone  200 mg Oral Daily   artificial tears   Left Eye QID   aspirin EC  81 mg Oral Q breakfast   atorvastatin  80 mg Oral Daily   Chlorhexidine Gluconate Cloth  6 each Topical Q0600   dorzolamide-timolol  1 drop Right Eye  BID   heparin  5,000 Units Subcutaneous Q8H   insulin aspart  0-5 Units Subcutaneous QHS   insulin aspart  0-6 Units Subcutaneous TID WC   lactobacillus  1 g Oral TID WC   methocarbamol  500 mg Oral TID   metoprolol tartrate  25 mg Oral TID   multivitamin with minerals  1 tablet Oral Daily   prednisoLONE acetate  1 drop Left Eye Q4H   sodium chloride flush  3 mL Intravenous Q12H   sodium chloride flush  3 mL Intravenous Q12H    BMET    Component Value Date/Time   NA 134 (L) 04/24/2021 0826   K 4.0 04/24/2021 0826   CL 101 04/24/2021 0826   CO2 23 04/24/2021 0826   GLUCOSE 87  04/24/2021 0826   BUN 40 (H) 04/24/2021 0826   CREATININE 7.85 (H) 04/24/2021 0826   CALCIUM 8.3 (L) 04/24/2021 0826   GFRNONAA 8 (L) 04/24/2021 0826   GFRAA 5 (L) 08/27/2019 1540   CBC    Component Value Date/Time   WBC 9.6 04/23/2021 0919   RBC 5.11 04/23/2021 0919   HGB 11.5 (L) 04/23/2021 0919   HCT 40.9 04/23/2021 0919   PLT 171 04/23/2021 0919   MCV 80.0 04/23/2021 0919   MCH 22.5 (L) 04/23/2021 0919   MCHC 28.1 (L) 04/23/2021 0919   RDW 19.8 (H) 04/23/2021 0919   LYMPHSABS 1.6 04/23/2021 0919   MONOABS 1.1 (H) 04/23/2021 0919   EOSABS 0.6 (H) 04/23/2021 0919   BASOSABS 0.1 04/23/2021 0919    Dialysis Orders: Center: [facility]   on TTS . EDW 87 kg HD [place] 2K/2.5Ca  Time 3:30 Heparin . Access Left [place] catheter, RUE AVF 1800 units IVP then 400 units/hr BFR 400 DFR 800    Micera 150 mcg IVP every 2 weeks, Venofer 50 mg IV TTS, calcitriol 0.5 mcg TTS   Assessment/Plan:  Acute bilateral watershed stroke - MRA without obstruction of vessels.  Likely due to hemodynamic changes with HD as he and his wife report frequent drops of BP with HD, however after discussion with his home unit, he did not have any drop in BP during HD and had syncope after HD was completed.  Neuro following. Discussed possible need for transition to PD given risk of recurrence.  ESRD -  plan for HD today.  Cannot dose with midodrine before HD due to prolonged QT interval.  Discussed with he and his wife about possible transition to PD as above-  they have appropriate questions regarding that today.  No heparin with HD.  Will try to maintain BP in HD.  Next HD will be due tomorrow- either here or at [facility] if goes to IR   Hypertension/volume  - stable  Anemia  - stable-  hgb over 11-  no meds   Metabolic bone disease -  noncompliant with meds. Supposed to be on phoslo-  will order calcitriol as well   Nutrition - renal diet, carb modified. Disposition - possible discharge to CIR.   Patient will not be  physically seen over the weekend-  call with questions   Louis Meckel  [employer] [phone]

## 2021-04-25 DIAGNOSIS — R195 Other fecal abnormalities: Secondary | ICD-10-CM

## 2021-04-25 DIAGNOSIS — Z992 Dependence on renal dialysis: Secondary | ICD-10-CM

## 2021-04-25 DIAGNOSIS — I10 Essential (primary) hypertension: Secondary | ICD-10-CM

## 2021-04-25 DIAGNOSIS — N186 End stage renal disease: Secondary | ICD-10-CM

## 2021-04-25 LAB — RENAL FUNCTION PANEL
Albumin: 3 g/dL — ABNORMAL LOW (ref 3.5–5.0)
Anion gap: 16 — ABNORMAL HIGH (ref 5–15)
BUN: 57 mg/dL — ABNORMAL HIGH (ref 6–20)
CO2: 18 mmol/L — ABNORMAL LOW (ref 22–32)
Calcium: 8.9 mg/dL (ref 8.9–10.3)
Chloride: 102 mmol/L (ref 98–111)
Creatinine, Ser: 10.18 mg/dL — ABNORMAL HIGH (ref 0.61–1.24)
GFR, Estimated: 6 mL/min — ABNORMAL LOW (ref >60)
Glucose, Bld: 100 mg/dL — ABNORMAL HIGH (ref 70–99)
Phosphorus: 7.8 mg/dL — ABNORMAL HIGH (ref 2.5–4.6)
Potassium: 4.6 mmol/L (ref 3.5–5.1)
Sodium: 136 mmol/L (ref 135–145)

## 2021-04-25 LAB — CBC
HCT: 39.4 % (ref 39.0–52.0)
Hemoglobin: 12 g/dL — ABNORMAL LOW (ref 13.0–17.0)
MCH: 23.7 pg — ABNORMAL LOW (ref 26.0–34.0)
MCHC: 30.5 g/dL (ref 30.0–36.0)
MCV: 77.9 fL — ABNORMAL LOW (ref 80.0–100.0)
Platelets: 160 10*3/uL (ref 150–400)
RBC: 5.06 MIL/uL (ref 4.22–5.81)
RDW: 18.8 % — ABNORMAL HIGH (ref 11.5–15.5)
WBC: 7.4 10*3/uL (ref 4.0–10.5)
nRBC: 0 % (ref 0.0–0.2)

## 2021-04-25 LAB — GLUCOSE, CAPILLARY
Glucose-Capillary: 88 mg/dL (ref 70–99)
Glucose-Capillary: 108 mg/dL — ABNORMAL HIGH (ref 70–99)
Glucose-Capillary: 71 mg/dL (ref 70–99)

## 2021-04-25 NOTE — Plan of Care (Signed)
?  Problem: RH Balance ?Goal: LTG Patient will maintain dynamic sitting balance (PT) ?Description: LTG:  Patient will maintain dynamic sitting balance with assistance during mobility activities (PT) ?Flowsheets (Taken 04/25/2021 1521) ?LTG: Pt will maintain dynamic sitting balance during mobility activities with:: Independent with assistive device  ?Goal: LTG Patient will maintain dynamic standing balance (PT) ?Description: LTG:  Patient will maintain dynamic standing balance with assistance during mobility activities (PT) ?Flowsheets (Taken 04/25/2021 1521) ?LTG: Pt will maintain dynamic standing balance during mobility activities with:: Supervision/Verbal cueing ?  ?Problem: RH Bed Mobility ?Goal: LTG Patient will perform bed mobility with assist (PT) ?Description: LTG: Patient will perform bed mobility with assistance, with/without cues (PT). ?Flowsheets (Taken 04/25/2021 1521) ?LTG: Pt will perform bed mobility with assistance level of: Supervision/Verbal cueing ?  ?Problem: RH Bed to Chair Transfers ?Goal: LTG Patient will perform bed/chair transfers w/assist (PT) ?Description: LTG: Patient will perform bed to chair transfers with assistance (PT). ?Flowsheets (Taken 04/25/2021 1521) ?LTG: Pt will perform Bed to Chair Transfers with assistance level: Supervision/Verbal cueing ?  ?Problem: RH Car Transfers ?Goal: LTG Patient will perform car transfers with assist (PT) ?Description: LTG: Patient will perform car transfers with assistance (PT). ?Flowsheets (Taken 04/25/2021 1521) ?LTG: Pt will perform car transfers with assist:: Contact Guard/Touching assist ?  ?Problem: RH Ambulation ?Goal: LTG Patient will ambulate in controlled environment (PT) ?Description: LTG: Patient will ambulate in a controlled environment, # of feet with assistance (PT). ?Flowsheets (Taken 04/25/2021 1521) ?LTG: Pt will ambulate in controlled environ  assist needed:: Contact Guard/Touching assist ?LTG: Ambulation distance in controlled environment:  157ft with LRAD ?Goal: LTG Patient will ambulate in home environment (PT) ?Description: LTG: Patient will ambulate in home environment, # of feet with assistance (PT). ?Flowsheets (Taken 04/25/2021 1521) ?LTG: Pt will ambulate in home environ  assist needed:: Contact Guard/Touching assist ?LTG: Ambulation distance in home environment: 60ft with LRAD ?  ?Problem: RH Wheelchair Mobility ?Goal: LTG Patient will propel w/c in controlled environment (PT) ?Description: LTG: Patient will propel wheelchair in controlled environment, # of feet with assist (PT) ?Flowsheets (Taken 04/25/2021 1521) ?LTG: Pt will propel w/c in controlled environ  assist needed:: Contact Guard/Touching assist ?LTG: Propel w/c distance in controlled environment: 137ft ?  ?Problem: RH Stairs ?Goal: LTG Patient will ambulate up and down stairs w/assist (PT) ?Description: LTG: Patient will ambulate up and down # of stairs with assistance (PT) ?Flowsheets (Taken 04/25/2021 1521) ?LTG: Pt will ambulate up/down stairs assist needed:: Contact Guard/Touching assist ?LTG: Pt will  ambulate up and down number of stairs: 4 steps with 1 rail ?  ?

## 2021-04-25 NOTE — Evaluation (Signed)
Physical Therapy Assessment and Plan  Patient Details  Name: [name]. MRN: [id number] Date of Birth: 05-25-1970  PT Diagnosis: Abnormal posture, Abnormality of gait, Coordination disorder, Hemiparesis non-dominant, Hypotonia, and Muscle weakness Rehab Potential: Good ELOS: 16-19 days   Today's Date: 04/25/2021 PT Individual Time: 1100-1200 PT Individual Time Calculation (min): 60 min    Hospital Problem: Principal Problem:   Cerebral ischemic stroke due to global hypoperfusion with watershed infarct [facility])   Past Medical History:  Past Medical History:  Diagnosis Date   Anxiety    Cellulitis    CHF (congestive heart failure) (HCC)    Chronic diarrhea    Chronic kidney disease    Chronic pain of both ankles    Diabetes mellitus without complication ([place])    Diabetes mellitus, type II ([place])    Diabetic neuropathy (HCC)    Esophagitis    Frequent falls    Gait instability    Gastritis    GERD (gastroesophageal reflux disease)    H/O enucleation of left eyeball    Heart palpitations    Hemodialysis patient ([place])    History of kidney stones    HLD (hyperlipidemia)    Hypertension    Insomnia    Nausea and vomiting in adult    recurrent   Past Surgical History:  Past Surgical History:  Procedure Laterality Date   AMPUTATION Left 03/16/2016   Procedure: AMPUTATION DIGIT LEFT HALLUX;  Surgeon: Trula Slade, DPM;  Location: [place];  Service: Podiatry;  Laterality: Left;  can start around 5    AMPUTATION Right 02/09/2021   Procedure: AMPUTATION DIGIT;  Surgeon: Sherilyn Cooter, MD;  Location: [place];  Service: Orthopedics;  Laterality: Right;   ARTHROSCOPIC REPAIR ACL Left    AV FISTULA PLACEMENT Right 05/31/2019   Procedure: Brachiocephalic AV fistula creation;  Surgeon: Algernon Huxley, MD;  Location: [name];  Service: Vascular;  Laterality: Right;   COLONOSCOPY WITH PROPOFOL N/A 10/28/2015   Procedure: COLONOSCOPY WITH PROPOFOL;  Surgeon: Lollie Sails, MD;   Location: [facility] ENDOSCOPY;  Service: Endoscopy;  Laterality: N/A;   COLONOSCOPY WITH PROPOFOL N/A 10/29/2015   Procedure: COLONOSCOPY WITH PROPOFOL;  Surgeon: Lollie Sails, MD;  Location: [facility] ENDOSCOPY;  Service: Endoscopy;  Laterality: N/A;   CORONARY/GRAFT ACUTE MI REVASCULARIZATION N/A 02/26/2020   Procedure: Coronary/Graft Acute MI Revascularization;  Surgeon: Jettie Booze, MD;  Location: [place] CV LAB;  Service: Cardiovascular;  Laterality: N/A;   DIALYSIS/PERMA CATHETER INSERTION Right 04/26/2019   Perm Cath    DIALYSIS/PERMA CATHETER INSERTION N/A 04/26/2019   Procedure: DIALYSIS/PERMA CATHETER INSERTION;  Surgeon: Algernon Huxley, MD;  Location: [place] CV LAB;  Service: Cardiovascular;  Laterality: N/A;   DIALYSIS/PERMA CATHETER REMOVAL N/A 08/15/2019   Procedure: DIALYSIS/PERMA CATHETER REMOVAL;  Surgeon: Katha Cabal, MD;  Location: [place] CV LAB;  Service: Cardiovascular;  Laterality: N/A;   EMBOLIZATION Right 02/06/2021   Procedure: EMBOLIZATION;  Surgeon: Algernon Huxley, MD;  Location: [place] CV LAB;  Service: Cardiovascular;  Laterality: Right;  Right Upper Extremity Dialysis Access, Permcath Placement   ESOPHAGOGASTRODUODENOSCOPY (EGD) WITH PROPOFOL N/A 12/27/2017   Procedure: ESOPHAGOGASTRODUODENOSCOPY (EGD) WITH PROPOFOL;  Surgeon: Tol[place], Benay Pike, MD;  Location: [name] ENDOSCOPY;  Service: Gastroenterology;  Laterality: N/A;   INTRAVASCULAR ULTRASOUND/IVUS N/A 02/26/2020   Procedure: Intravascular Ultrasound/IVUS;  Surgeon: Jettie Booze, MD;  Location: [place] CV LAB;  Service: Cardiovascular;  Laterality: N/A;   LEFT HEART CATH AND CORONARY ANGIOGRAPHY N/A  02/26/2020   Procedure: LEFT HEART CATH AND CORONARY ANGIOGRAPHY;  Surgeon: Jettie Booze, MD;  Location: [place] CV LAB;  Service: Cardiovascular;  Laterality: N/A;   PROSTATE SURGERY  2016   TONSILECTOMY/ADENOIDECTOMY WITH MYRINGOTOMY     TONSILLECTOMY     UPPER EXTREMITY  ANGIOGRAPHY Right 11/26/2020   Procedure: Upper Extremity Angiography;  Surgeon: Algernon Huxley, MD;  Location: [place] CV LAB;  Service: Cardiovascular;  Laterality: Right;   UPPER EXTREMITY ANGIOGRAPHY Right 02/05/2021   Procedure: UPPER EXTREMITY ANGIOGRAPHY;  Surgeon: Algernon Huxley, MD;  Location: [place] CV LAB;  Service: Cardiovascular;  Laterality: Right;    Assessment & Plan Clinical Impression: Patient is a 51 year old LH-male with history of T2DM with neuropathy, ESRD- HD TTS, gait disorder w/falls, CVA 12/22, A fib who was originally admitted on [facility] from HD center on 04/21/21 with sudden onset of Left sided weakness after syncopal episode post dialysis. MRI brain done revealing patchy acute infarcts in watershed distribution affecting right parietal lobe and cortical infarcts bilateral high frontal lobes and subcentimer acute infarct in callosal splenium on the left. 2D echo done revealing EF 55-60% with no wall abnormality but severe LVH, severe LA dilatation and moderate pericardial effusion.  Patient not TNKase candidate as on eliquis. EEG done negative for seizure activity.    Eliquis/plavix placed on hold till repeat CT head 03/03 to rule out bleed. This was negative for hemorrhagic transformation therefore Eliquis/plavix resumed. He had had frequent BP drops with HD and  coreg changed to metoprolol due to ongoing issues with hypotension. EEG repeated  03/03 due to jerking/spastic episode on right with difficulty breathing yesterday.  EEG was WNL without epileptic discharge. Stroke felt to be secondary to hypoperfusion due to intradialytic hypotensive episode.  Patient transferred to CIR on 04/24/2021 .   Patient currently requires mod with mobility secondary to muscle weakness, muscle joint tightness, and muscle paralysis, decreased cardiorespiratoy endurance, abnormal tone, unbalanced muscle activation, and decreased coordination, decreased attention, decreased awareness, decreased  problem solving, decreased safety awareness, decreased memory, and delayed processing, and decreased sitting balance, decreased standing balance, decreased postural control, hemiplegia, and decreased balance strategies.  Prior to hospitalization, patient was independent  with mobility and lived with Spouse in a House home.  Home access is  Ramped entrance.  Patient will benefit from skilled PT intervention to maximize safe functional mobility, minimize fall risk, and decrease caregiver burden for planned discharge home with 24 hour assist.  Anticipate patient will benefit from follow up [facility] at discharge.  PT - End of Session Activity Tolerance: Tolerates 10 - 20 min activity with multiple rests Endurance Deficit: Yes PT Assessment Rehab Potential (ACUTE/IP ONLY): Good PT Barriers to Discharge: [place] home environment;Home environment access/layout;Insurance for SNF coverage;Behavior PT Patient demonstrates impairments in the following area(s): Balance;Behavior;Endurance;Motor;Nutrition;Perception;Safety;Sensory PT Transfers Functional Problem(s): Bed Mobility;Car;Bed to Chair;Furniture PT Locomotion Functional Problem(s): Ambulation;Wheelchair Mobility;Stairs PT Plan PT Intensity: Minimum of 1-2 x/day ,45 to 90 minutes PT Frequency: 5 out of 7 days PT Duration Estimated Length of Stay: 16-19 days PT Treatment/Interventions: Ambulation/gait training;Balance/vestibular training;Cognitive remediation/compensation;Functional mobility training;Discharge planning;Community reintegration;DME/adaptive equipment instruction;Disease management/prevention;Functional electrical stimulation;Neuromuscular re-education;Pain management;Patient/family education;Psychosocial support;Stair training;Therapeutic Activities;Skin care/wound management;Therapeutic Exercise;Splinting/orthotics;Visual/perceptual remediation/compensation;UE/LE Coordination activities;UE/LE Strength taining/ROM;Wheelchair  propulsion/positioning PT Transfers Anticipated Outcome(s): supervision assist PT Locomotion Anticipated Outcome(s): CGA with LRAD PT Recommendation Recommendations for Other Services: Speech consult;Therapeutic Recreation consult Therapeutic Recreation Interventions: Stress management Follow Up Recommendations: Home health PT Patient destination: Home Equipment Recommended: To be determined;Rolling  walker with 5" wheels   PT Evaluation Precautions/Restrictions Precautions Precautions: Fall Restrictions Weight Bearing Restrictions: No General Chart Reviewed: Yes Family/Caregiver Present: Yes (Wife) Vital SignsTherapy Vitals Temp: (!) 97.3 F (36.3 C) Pulse Rate: (!) 56 Resp: 16 BP: (!) 149/85 Pain Pain Assessment Pain Scale: 0-10 Pain Score: 0-No pain Pain Interference Pain Interference Pain Effect on Sleep: 4. Almost constantly Pain Interference with Therapy Activities: 1. Rarely or not at all Pain Interference with Day-to-Day Activities: 1. Rarely or not at all Home Living/Prior [place] Available Help at Discharge: Family;Available PRN/intermittently;Available 24 hours/day Type of Home: House Home Access: Ramped entrance Home Layout: One level Bathroom Shower/Tub: Multimedia programmer: Handicapped height Bathroom Accessibility: Yes Additional Comments: Pt reported no change in prior living set up since last admission.  Lives With: Spouse Prior Function Level of Independence: Requires assistive device for independence (SPC with mobility out of the house)  Able to Take Stairs?: Yes Driving: Yes Vocation: On disability Vocation Requirements: active in church Vision/Perception  Vision - History Ability to See in Adequate Light: 2 Moderately impaired Perception Perception: Impaired Inattention/Neglect:  (mild L inattention) Praxis Praxis: Impaired Praxis Impairment Details: Motor planning  Cognition Overall Cognitive Status:  Impaired/Different from baseline Arousal/Alertness: Awake/alert Orientation Level: Oriented X4 Year: 2023 Month: March Day of Week: Correct Attention: Sustained Sustained Attention: Impaired Sustained Attention Impairment: Verbal basic Memory: Impaired Memory Impairment: Retrieval deficit;Decreased recall of new information Immediate Memory Recall: Sock;Blue;Bed Memory Recall Sock: Not able to recall Memory Recall Blue: Without Cue Memory Recall Bed: Without Cue Awareness: Appears intact Problem Solving: Impaired Problem Solving Impairment: Verbal complex;Functional complex Safety/Judgment: Appears intact Sensation Sensation Light Touch: Appears Intact Coordination Gross Motor Movements are Fluid and Coordinated: No Fine Motor Movements are Fluid and Coordinated: No Motor  Motor Motor: Hemiplegia Motor - Skilled Clinical Observations: L hemiplegia weakness UE>LE; weaker proximally   Trunk/Postural Assessment  Cervical Assessment Cervical Assessment:  (head forward) Thoracic Assessment Thoracic Assessment:  (rounded shoulders) Lumbar Assessment Lumbar Assessment:  (post pelvic tilt) Postural Control Postural Control: Deficits on evaluation (delayed L>R)  Balance Balance Balance Assessed: Yes Dynamic Sitting Balance Dynamic Sitting - Level of Assistance: 5: Stand by assistance;4: Min assist Dynamic Sitting - Balance Activities: Forward lean/weight shifting Static Standing Balance Static Standing - Level of Assistance: 3: Mod assist;4: Min assist Extremity Assessment  RUE Assessment RUE Assessment: Within Functional Limits LUE Assessment LUE Assessment: Exceptions to [facility] LUE Body System: Neuro Brunstrum levels for arm and hand: Arm;Hand Brunstrum level for arm: Stage I Presynergy Brunstrum level for hand: Stage V Independence from basic synergies RLE Assessment RLE Assessment: Within Functional Limits General Strength Comments: grossly 4+/5 to 5/5 LLE  Assessment LLE Assessment: Exceptions to [facility] General Strength Comments: grossly 4/5 to 4+/5  Care Tool Care Tool Bed Mobility Roll left and right activity   Roll left and right assist level: Moderate Assistance - Patient 50 - 74%    Sit to lying activity   Sit to lying assist level: Moderate Assistance - Patient 50 - 74%    Lying to sitting on side of bed activity   Lying to sitting on side of bed assist level: the ability to move from lying on the back to sitting on the side of the bed with no back support.: Moderate Assistance - Patient 50 - 74%     Care Tool Transfers Sit to stand transfer   Sit to stand assist level: Moderate Assistance - Patient 50 - 74%  Chair/bed transfer   Chair/bed transfer assist level: Moderate Assistance - Patient 50 - 74%     Toilet transfer   Assist Level: Moderate Assistance - Patient 50 - 74%    Car transfer   Car transfer assist level: Maximal Assistance - Patient 25 - 49%      Care Tool Locomotion Ambulation   Assist level: Moderate Assistance - Patient 50 - 74%   Max distance: 30  Walk 10 feet activity   Assist level: Moderate Assistance - Patient - 50 - 74%     Walk 50 feet with 2 turns activity   Assist level: Moderate Assistance - Patient - 50 - 74%    Walk 150 feet activity Walk 150 feet activity did not occur: Safety/medical concerns      Walk 10 feet on uneven surfaces activity Walk 10 feet on uneven surfaces activity did not occur: Safety/medical concerns      Stairs   Assist level: Maximal Assistance - Patient 25 - 49% Stairs assistive device: 1 hand rail Max number of stairs: 4  Walk up/down 1 step activity   Walk up/down 1 step (curb) assist level: Maximal Assistance - Patient 25 - 49% Walk up/down 1 step or curb assistive device: 1 hand rail  Walk up/down 4 steps activity   Walk up/down 4 steps assist level: Maximal Assistance - Patient 25 - 49%    Walk up/down 12 steps activity Walk up/down 12 steps activity  did not occur: Safety/medical concerns      Pick up small objects from floor   Pick up small object from the floor assist level: Total Assistance - Patient < 25%    Wheelchair Is the patient using a wheelchair?: Yes Type of Wheelchair: Manual   Wheelchair assist level: Dependent - Patient 0% Max wheelchair distance: 150  Wheel 50 feet with 2 turns activity   Assist Level: Dependent - Patient 0%  Wheel 150 feet activity   Assist Level: Dependent - Patient 0%    Refer to Care Plan for Long Term Goals  SHORT TERM GOAL WEEK 1 PT Short Term Goal 1 (Week 1): pt will perform bed mobility with min assist PT Short Term Goal 2 (Week 1): Pt will ambulate 23f with min assist PT Short Term Goal [facility] (Week 1): [facility] Short Term Goal 4 (Week 1): Pt will perform TUG in < 1 minute  Recommendations for other services: Therapeutic Recreation  Stress management  Skilled Therapeutic Intervention Mobility Transfers Transfers: Sit to Stand;Stand to Sit;Stand Pivot Transfers Sit to Stand: Moderate Assistance - Patient 50-74% Stand to Sit: Moderate Assistance - Patient 50-74% Stand Pivot Transfers: Moderate Assistance - Patient 50 - 74% Stand Pivot Transfer Details: Manual facilitation for weight shifting;Verbal cues for precautions/safety;Verbal cues for safe use of DME/AE;Visual cues/gestures for sequencing Transfer (Assistive device): 1 person hand held assist Locomotion  Gait Ambulation: Yes Gait Assistance: Minimal Assistance - Patient > 75%;Moderate Assistance - Patient 50-74% Gait Distance (Feet): 20 Feet Assistive device: Rolling walker Gait Gait: Yes Gait Pattern: Impaired Gait Pattern: Poor foot clearance - left;Narrow base of support;Trunk flexed Stairs / Additional Locomotion Stairs: Yes Stairs Assistance: Moderate Assistance - Patient 50 - 74% Stair Management Technique: One rail Right Number of Stairs: 4 Height of Stairs: 6 Wheelchair  Mobility Wheelchair Mobility: Yes Wheelchair Assistance: Dependent - Patient 0% Distance: 150  Pt received supine in bed and agreeable to PT. Supine>sit transfer with mod assist and  cues for sequencing. PT instructed patient in PT Evaluation and initiated treatment intervention; see above for results. PT educated patient in [place], rehab potential, rehab goals, and discharge recommendations along with recommendation for follow-up rehabilitation services.  Pt attempted BM on toilet with ambulatory transfer with RW and mod assist for safety. Unable to void.  PT instructed pt in TUG: 1:56 min (average of 3 trials; >13.5 sec indicates increased fall risk) max cues for initial stand due to moderate posterior bias.  Pt performed 5 time sit<>stand (5xSTS): 59 sec (>15 sec indicates increased fall risk)   Stair management and gait training as listed above.  Car transfer training with max cues for safety sequencing as pt lifting R LE unle hip tightness lifting LLE into air. Max assist for trunk support while pt shimmy pelvis to L. Education to pt and wife for alternate techniques in the future. Patient returned to room and performed stand pivot to recliner with mod assist and RW. Pt left sitting in recliner with call bell in reach and all needs met.      Discharge Criteria: Patient will be discharged from PT if patient refuses treatment 3 consecutive times without medical reason, if treatment goals not met, if there is a change in medical status, if patient makes no progress towards goals or if patient is discharged from hospital.  The above assessment, treatment plan, treatment alternatives and goals were discussed and mutually agreed upon: by patient  Lorie Phenix 04/25/2021, 2:59 PM

## 2021-04-25 NOTE — Progress Notes (Signed)
Occupational Therapy Assessment and Plan  Patient Details  Name: [name]. MRN: [id number] Date of Birth: August 12, 1970  OT Diagnosis: abnormal posture, apraxia, blindness and low vision, cognitive deficits, hemiplegia affecting dominant side, and muscle weakness (generalized) Rehab Potential: Rehab Potential (ACUTE ONLY): Good ELOS: 2.5-3 weeks   Today's Date: 04/25/2021 OT Individual Time: 0800-0900 OT Individual Time Calculation (min): 60 min     Hospital Problem: Principal Problem:   Cerebral ischemic stroke due to global hypoperfusion with watershed infarct [facility])   Past Medical History:  Past Medical History:  Diagnosis Date   Anxiety    Cellulitis    CHF (congestive heart failure) (HCC)    Chronic diarrhea    Chronic kidney disease    Chronic pain of both ankles    Diabetes mellitus without complication ([place])    Diabetes mellitus, type II ([place])    Diabetic neuropathy (HCC)    Esophagitis    Frequent falls    Gait instability    Gastritis    GERD (gastroesophageal reflux disease)    H/O enucleation of left eyeball    Heart palpitations    Hemodialysis patient ([place])    History of kidney stones    HLD (hyperlipidemia)    Hypertension    Insomnia    Nausea and vomiting in adult    recurrent   Past Surgical History:  Past Surgical History:  Procedure Laterality Date   AMPUTATION Left 03/16/2016   Procedure: AMPUTATION DIGIT LEFT HALLUX;  Surgeon: Trula Slade, DPM;  Location: [place];  Service: Podiatry;  Laterality: Left;  can start around 5    AMPUTATION Right 02/09/2021   Procedure: AMPUTATION DIGIT;  Surgeon: Sherilyn Cooter, MD;  Location: [place];  Service: Orthopedics;  Laterality: Right;   ARTHROSCOPIC REPAIR ACL Left    AV FISTULA PLACEMENT Right 05/31/2019   Procedure: Brachiocephalic AV fistula creation;  Surgeon: Algernon Huxley, MD;  Location: [name] ORS;  Service: Vascular;  Laterality: Right;   COLONOSCOPY WITH PROPOFOL N/A 10/28/2015   Procedure:  COLONOSCOPY WITH PROPOFOL;  Surgeon: Lollie Sails, MD;  Location: [facility] ENDOSCOPY;  Service: Endoscopy;  Laterality: N/A;   COLONOSCOPY WITH PROPOFOL N/A 10/29/2015   Procedure: COLONOSCOPY WITH PROPOFOL;  Surgeon: Lollie Sails, MD;  Location: [facility] ENDOSCOPY;  Service: Endoscopy;  Laterality: N/A;   CORONARY/GRAFT ACUTE MI REVASCULARIZATION N/A 02/26/2020   Procedure: Coronary/Graft Acute MI Revascularization;  Surgeon: Jettie Booze, MD;  Location: [place] CV LAB;  Service: Cardiovascular;  Laterality: N/A;   DIALYSIS/PERMA CATHETER INSERTION Right 04/26/2019   Perm Cath    DIALYSIS/PERMA CATHETER INSERTION N/A 04/26/2019   Procedure: DIALYSIS/PERMA CATHETER INSERTION;  Surgeon: Algernon Huxley, MD;  Location: [place] CV LAB;  Service: Cardiovascular;  Laterality: N/A;   DIALYSIS/PERMA CATHETER REMOVAL N/A 08/15/2019   Procedure: DIALYSIS/PERMA CATHETER REMOVAL;  Surgeon: Katha Cabal, MD;  Location: [place] CV LAB;  Service: Cardiovascular;  Laterality: N/A;   EMBOLIZATION Right 02/06/2021   Procedure: EMBOLIZATION;  Surgeon: Algernon Huxley, MD;  Location: [place] CV LAB;  Service: Cardiovascular;  Laterality: Right;  Right Upper Extremity Dialysis Access, Permcath Placement   ESOPHAGOGASTRODUODENOSCOPY (EGD) WITH PROPOFOL N/A 12/27/2017   Procedure: ESOPHAGOGASTRODUODENOSCOPY (EGD) WITH PROPOFOL;  Surgeon: [place], Benay Pike, MD;  Location: [name] ENDOSCOPY;  Service: Gastroenterology;  Laterality: N/A;   INTRAVASCULAR ULTRASOUND/IVUS N/A 02/26/2020   Procedure: Intravascular Ultrasound/IVUS;  Surgeon: Jettie Booze, MD;  Location: [place] CV LAB;  Service: Cardiovascular;  Laterality: N/A;  LEFT HEART CATH AND CORONARY ANGIOGRAPHY N/A 02/26/2020   Procedure: LEFT HEART CATH AND CORONARY ANGIOGRAPHY;  Surgeon: Jettie Booze, MD;  Location: [place] CV LAB;  Service: Cardiovascular;  Laterality: N/A;   PROSTATE SURGERY  2016    TONSILECTOMY/ADENOIDECTOMY WITH MYRINGOTOMY     TONSILLECTOMY     UPPER EXTREMITY ANGIOGRAPHY Right 11/26/2020   Procedure: Upper Extremity Angiography;  Surgeon: Algernon Huxley, MD;  Location: [place] CV LAB;  Service: Cardiovascular;  Laterality: Right;   UPPER EXTREMITY ANGIOGRAPHY Right 02/05/2021   Procedure: UPPER EXTREMITY ANGIOGRAPHY;  Surgeon: Algernon Huxley, MD;  Location: [place] CV LAB;  Service: Cardiovascular;  Laterality: Right;    Assessment & Plan Clinical Impression: [name]  is a 51 y.o. 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 Fib, DChF, who presents from hemodialysis center with sudden onset of left-sided weakness  Patient currently requires mod with basic self-care skills secondary to muscle weakness, decreased cardiorespiratoy endurance, impaired timing and sequencing, abnormal tone, unbalanced muscle activation, motor apraxia, decreased coordination, and decreased motor planning, decreased awareness, decreased problem solving, decreased memory, and delayed processing, and decreased sitting balance, decreased standing balance, decreased postural control, and decreased balance strategies.  Prior to hospitalization, patient could complete BADL/IADL with modified independent .  Patient will benefit from skilled intervention to decrease level of assist with basic self-care skills and increase independence with basic self-care skills prior to discharge home with care partner.  Anticipate patient will require 24 hour supervision and minimal physical assistance and follow up home health.  OT - End of Session Activity Tolerance: Tolerates 30+ min activity with multiple rests Endurance Deficit: Yes OT Assessment Rehab Potential (ACUTE ONLY): Good OT Barriers to Discharge: Decreased caregiver support;Lack of/limited  family support;Hemodialysis OT Patient demonstrates impairments in the following area(s): Balance;Cognition;Edema;Endurance;Motor;Nutrition;Pain;Perception;Safety OT Basic ADL's Functional Problem(s): Grooming;Bathing;Dressing;Toileting;Eating OT Transfers Functional Problem(s): Toilet;Tub/Shower OT Additional Impairment(s): Fuctional Use of Upper Extremity OT Plan OT Intensity: Minimum of 1-2 x/day, 45 to 90 minutes OT Frequency: 5 out of 7 days OT Duration/Estimated Length of Stay: 2.5-3 weeks OT Treatment/Interventions: Balance/vestibular training;Discharge planning;Functional electrical stimulation;Pain management;Self Care/advanced ADL retraining;Therapeutic Activities;UE/LE Coordination activities;Therapeutic Exercise;Skin care/wound managment;Patient/family education;Functional mobility training;Disease mangement/prevention;Cognitive remediation/compensation;Community reintegration;DME/adaptive equipment instruction;Neuromuscular re-education;Psychosocial support;Splinting/orthotics;UE/LE Strength taining/ROM;Wheelchair propulsion/positioning OT Self Feeding Anticipated Outcome(s): S OT Basic Self-Care Anticipated Outcome(s): S UB, MIN LB OT Toileting Anticipated Outcome(s): MIN OT Bathroom Transfers Anticipated Outcome(s): S OT Recommendation Patient destination: Home Follow Up Recommendations: Home health OT Equipment Recommended: 3 in 1 bedside comode;To be determined   OT Evaluation Precautions/Restrictions  Precautions Precautions: Fall Restrictions Weight Bearing Restrictions: No General Chart Reviewed: Yes Family/Caregiver Present: Yes Vital Signs  Pain Pain Assessment Pain Scale: 0-10 Pain Score: 0-No pain Home Living/Prior Functioning Home Living Family/patient expects to be discharged to:: Private residence Living Arrangements: Spouse/significant other Available Help at Discharge: Family, Available PRN/intermittently, Available 24 hours/day Type of Home:  House Home Access: Ramped entrance Home Layout: One level Bathroom Shower/Tub: Multimedia programmer: Handicapped height Bathroom Accessibility: Yes Additional Comments: Pt reported no change in prior living set up since last admission.  Lives With: Spouse IADL History Education: some college Prior Function Level of Independence: Requires assistive device for independence (SPC with mobility out of the house)  Able to Take Stairs?: Yes Driving: Yes Vocation: On disability Vocation Requirements: active in church Vision Baseline Vision/History:  2 Legally blind Ability to See in Adequate Light: 2 Moderately impaired Patient Visual Report: No change from baseline Perception  Perception: Impaired Inattention/Neglect:  (mild L inattention) Praxis Praxis: Impaired Praxis Impairment Details: Motor planning Cognition Overall Cognitive Status: Impaired/Different from baseline Arousal/Alertness: Awake/alert Orientation Level: Person;Place;Situation Person: Oriented Place: Oriented Situation: Oriented Year: 2023 Month: March Day of Week: Correct Memory: Impaired Memory Impairment: Retrieval deficit;Decreased recall of new information Immediate Memory Recall: Sock;Blue;Bed Memory Recall Sock: Not able to recall Memory Recall Blue: Without Cue Memory Recall Bed: Without Cue Attention: Sustained Sustained Attention: Impaired Sustained Attention Impairment: Verbal basic Awareness: Appears intact Problem Solving: Impaired Problem Solving Impairment: Verbal complex;Functional complex Safety/Judgment: Appears intact Sensation Sensation Light Touch: Appears Intact Coordination Gross Motor Movements are Fluid and Coordinated: No Fine Motor Movements are Fluid and Coordinated: No Motor  Motor Motor: Hemiplegia Motor - Skilled Clinical Observations: L hemiplegia weakness UE>LE; weaker proximally  Trunk/Postural Assessment  Cervical Assessment Cervical Assessment:  (head  forward) Thoracic Assessment Thoracic Assessment:  (rounded shoulders) Lumbar Assessment Lumbar Assessment:  (post pelvic tilt) Postural Control Postural Control: Deficits on evaluation (delayed L>R)  Balance Balance Balance Assessed: Yes Dynamic Sitting Balance Dynamic Sitting - Level of Assistance: 5: Stand by assistance;4: Min assist Dynamic Sitting - Balance Activities: Forward lean/weight shifting Static Standing Balance Static Standing - Level of Assistance: 3: Mod assist;4: Min assist Extremity/Trunk Assessment RUE Assessment RUE Assessment: Within Functional Limits LUE Assessment LUE Assessment: Exceptions to [facility] LUE Body System: Neuro Brunstrum levels for arm and hand: Arm;Hand Brunstrum level for arm: Stage I Presynergy Brunstrum level for hand: Stage V Independence from basic synergies  Care Tool Care Tool Self Care Eating   Eating Assist Level: Total Assistance - Patient < 25%    Oral Care    Oral Care Assist Level: Maximal assistance - Patient 25 - 49%    Bathing   Body parts bathed by patient: Left arm;Right upper leg;Left upper leg;Face Body parts bathed by helper: Chest;Abdomen;Right arm;Front perineal area;Buttocks;Right lower leg;Left lower leg   Assist Level: Total Assistance - Patient < 25%    Upper Body Dressing(including orthotics) Upper body dressing/undressing activity did not occur (including orthotics): Safety/medical concerns What is the patient wearing?: Pull over shirt   Assist Level: Maximal Assistance - Patient 25 - 49%    Lower Body Dressing (excluding footwear) Lower body dressing activity did not occur: Safety/medical concerns What is the patient wearing?: Incontinence brief;Pants Assist for lower body dressing: Total Assistance - Patient < 25%    Putting on/Taking off footwear   What is the patient wearing?: Non-skid slipper socks Assist for footwear: Total Assistance - Patient < 25%       Care Tool Toileting Toileting activity    Assist for toileting: Total Assistance - Patient < 25%     Care Tool Bed Mobility Roll left and right activity   Roll left and right assist level: Moderate Assistance - Patient 50 - 74%    Sit to lying activity        Lying to sitting on side of bed activity         Care Tool Transfers Sit to stand transfer Sit to stand activity did not occur: Safety/medical concerns Sit to stand assist level: Moderate Assistance - Patient 50 - 74%    Chair/bed transfer Chair/bed transfer activity did not occur: Safety/medical concerns Chair/bed transfer assist level: Moderate Assistance - Patient 50 - 74%     Toilet transfer Toilet transfer activity did not  occur: Safety/medical concerns Assist Level: Moderate Assistance - Patient 50 - 74%     Care Tool Cognition  Expression of Ideas and Wants Expression of Ideas and Wants: 3. Some difficulty - exhibits some difficulty with expressing needs and ideas (e.g, some words or finishing thoughts) or speech is not clear  Understanding Verbal and Non-Verbal Content Understanding Verbal and Non-Verbal Content: 4. Understands (complex and basic) - clear comprehension without cues or repetitions   Memory/Recall Ability Memory/Recall Ability : Current season;That he or she is in a hospital/hospital unit;Staff names and faces   Refer to Care Plan for Long Term Goals  SHORT TERM GOAL WEEK 1 OT Short Term Goal 1 (Week 1): Pt will bathe UB with no A except to facilitate LUE To wash RUE + min cuing OT Short Term Goal 2 (Week 1): Pt will completes oral care wiht MOD HOH A OT Short Term Goal 3 (Week 1): Pt will recall hemi dressing techniques wiht MIN VC OT Short Term Goal 4 (Week 1): Pt will transfer to toilet wiht MIN A  Recommendations for other services: None    Skilled Therapeutic Intervention 1:1. Pt received in bed agreeable to OT after edu re OT role/purpose, CIR, ELOS and CVA recovery. Pt engages in BADL retraining at sit to stand level at sink  after OT obtains w/c, LE rests and half lap tray for improved LUE positioning. HOH A provided for education on NMR of LUE to incorporate in BADL activities. Pt with small perseverative movements during bathing requiring increased A for thoroughness. Hemi dressing strategies explained however unable to carry over from UB to LB. Exited session with pt seated in w/c with wife in room and MD entering with SLP, exit alarm on and call light in reach   ADL ADL Grooming: Maximal assistance Where Assessed-Grooming: Sitting at sink;Other (comment) (per wife) Upper Body Bathing: Maximal assistance Where Assessed-Upper Body Bathing: Sitting at sink Lower Body Bathing: Maximal assistance Where Assessed-Lower Body Bathing: Sitting at sink;Standing at sink Upper Body Dressing: Maximal assistance Where Assessed-Upper Body Dressing: Sitting at sink Lower Body Dressing: Maximal assistance Where Assessed-Lower Body Dressing: Sitting at sink;Standing at sink (MOD A for sit to stand) Toilet Transfer: Moderate assistance Toilet Transfer Method: Stand pivot Toilet Transfer Equipment: Grab bars;Bedside commode Tub/Shower Transfer Method: Unable to assess Social research officer, government Method: Unable to assess Mobility  Transfers Sit to Stand: Moderate Assistance - Patient 50-74% Stand to Sit: Moderate Assistance - Patient 50-74%   Discharge Criteria: Patient will be discharged from OT if patient refuses treatment 3 consecutive times without medical reason, if treatment goals not met, if there is a change in medical status, if patient makes no progress towards goals or if patient is discharged from hospital.  The above assessment, treatment plan, treatment alternatives and goals were discussed and mutually agreed upon: by patient  Tonny Branch 04/25/2021, 12:31 PM

## 2021-04-25 NOTE — Progress Notes (Signed)
Patient ID: [name], male   DOB: 12-02-1970, 51 y.o.   MRN: [id number] ? ?S: Seen in CIR- transferred here from [place].  Just worked with OT.  No issues today ? ? ?O:BP (!) 167/65   Pulse 62   Temp (!) 100.6 ?F (38.1 ?C) (Oral)   Resp 14   Ht 6\' 3"  (1.905 m)   Wt 81.4 kg   SpO2 99%   BMI 22.43 kg/m?  ? ?Intake/Output Summary (Last 24 hours) at 04/25/2021 0946 ?Last data filed at 04/25/2021 0736 ?Gross per 24 hour  ?Intake 240 ml  ?Output --  ?Net 240 ml  ? ?Intake/Output: ?I/O last 3 completed shifts: ?In: 120 [P.O.:120] ?Out: -  ? Intake/Output this shift: ? Total I/O ?In: 120 [P.O.:120] ?Out: -  ?Weight change:  ?Gen:NAD ?CVS: RRR ?Resp:CTA ?Abd: +BS, soft, NT/ND ?Ext: no edema ?ACCESS: L IJ TDC ? ?Recent Labs  ?Lab 04/21/21 ?1004 04/22/21 ?3888 04/23/21 ?2800 04/24/21 ?3491  ?NA 138 136 137 134*  ?K 3.7 4.3 5.7* 4.0  ?CL 103 105 107 101  ?CO2 23 21* 16* 23  ?GLUCOSE 91 70 91 87  ?BUN 28* 43* 62* 40*  ?CREATININE 6.63* 8.77* 11.23* 7.85*  ?ALBUMIN 3.3*  --  3.2*  --   ?CALCIUM 8.5* 8.5* 8.7* 8.3*  ?AST 14*  --  12*  --   ?ALT 11  --  11  --   ? ?Liver Function Tests: ?Recent Labs  ?Lab 04/21/21 ?1004 04/23/21 ?0919  ?AST 14* 12*  ?ALT 11 11  ?ALKPHOS 80 81  ?BILITOT 0.7 0.6  ?PROT 6.9 6.6  ?ALBUMIN 3.3* 3.2*  ? ?No results for input(s): LIPASE, AMYLASE in the last 168 hours. ?No results for input(s): AMMONIA in the last 168 hours. ?CBC: ?Recent Labs  ?Lab 04/21/21 ?1004 04/22/21 ?7915 04/23/21 ?0919  ?WBC 6.0 9.3 9.6  ?NEUTROABS 3.8  --  6.2  ?HGB 11.4* 10.7* 11.5*  ?HCT 37.5* 36.7* 40.9  ?MCV 79.1* 80.0 80.0  ?PLT 156 156 171  ? ?Cardiac Enzymes: ?No results for input(s): CKTOTAL, CKMB, CKMBINDEX, TROPONINI in the last 168 hours. ?CBG: ?Recent Labs  ?Lab 04/23/21 ?2228 04/24/21 ?0809 04/24/21 ?1153 04/24/21 ?1640 04/25/21 ?0629  ?GLUCAP 102* 106* 97 100* 88  ? ? ?Iron Studies: No results for input(s): IRON, TIBC, TRANSFERRIN, FERRITIN in the last 72 hours. ?Studies/Results: ?CT HEAD WO CONTRAST  (5MM) ? ?Result Date: 04/24/2021 ?CLINICAL DATA:  51 year old male with neurologic deficit, patchy bilateral superior cortical infarcts on recent MRI. EXAM: CT HEAD WITHOUT CONTRAST TECHNIQUE: Contiguous axial images were obtained from the base of the skull through the vertex without intravenous contrast. RADIATION DOSE REDUCTION: This exam was performed according to the departmental dose-optimization program which includes automated exposure control, adjustment of the mA and/or kV according to patient size and/or use of iterative reconstruction technique. COMPARISON:  Brain MRI 04/21/2021.  Head CT 04/21/2021. FINDINGS: Brain: Bilateral superior frontal gyrus and right posterior parietal cortical infarcts seen recently by MRI remain occult largely occult by CT (series 2, image 23). No associated hemorrhage or mass effect. Stable gray-white matter differentiation elsewhere. The no superimposed midline shift, ventriculomegaly, mass effect, evidence of mass lesion, intracranial hemorrhage or new cortically based infarct. Vascular: Calcified atherosclerosis at the skull base. No suspicious intracranial vascular hyperdensity. Skull: No acute osseous abnormality identified. Sinuses/Orbits: Visualized paranasal sinuses and mastoids are stable and well aerated. Other: Widespread calcified scalp vessel atherosclerosis. Left globe prosthesis. No acute orbit or scalp squamous that suboccipital scalp  soft tissue scarring. No acute orbit or scalp soft tissue finding. IMPRESSION: 1. Bilateral patchy cortical infarcts primarily at the vertex last month remain largely occult by CT. No hemorrhagic transformation or mass effect. 2. No new intracranial abnormality. 3. Extensive calcified scalp vessel atherosclerosis, most commonly observed with End stage renal disease. Electronically Signed   By: Genevie [name] M.D.   On: 04/24/2021 08:14  ? ?EEG adult ? ?Result Date: 04/24/2021 ?Lora Havens, MD     04/24/2021  9:48 AM Patient Name:  [name] MRN: [id number] Epilepsy Attending: Lora Havens Referring Physician/Provider: Deatra James, MD Date: 04/24/2021 Duration: 22.40 mins Patient history: 51 year old male with seizure-like episode.  EEG to evaluate for seizures. Level of alertness: Awake, asleep AEDs during EEG study: None Technical aspects: This EEG study was done with scalp electrodes positioned according to the 10-20 International system of electrode placement. Electrical activity was acquired at a sampling rate of 500Hz  and reviewed with a high frequency filter of 70Hz  and a low frequency filter of 1Hz . EEG data were recorded continuously and digitally stored. Description: The posterior dominant rhythm consists of 9 Hz activity of moderate voltage (25-35 uV) seen predominantly in posterior head regions, symmetric and reactive to eye opening and eye closing. Sleep was characterized by vertex waves, sleep spindles (12 to 14 Hz), maximal frontocentral region. Hyperventilation and photic stimulation were not performed.   IMPRESSION: This study is within normal limits. No seizures or epileptiform discharges were seen throughout the recording. [place]   ? amiodarone  200 mg Oral Daily  ? apixaban  5 mg Oral BID  ? artificial tears   Left Eye QID  ? atorvastatin  80 mg Oral Daily  ? calcitRIOL  0.5 mcg Oral Q T,Th,Sa-HD  ? calcium acetate  1,334 mg Oral TID WC  ? clopidogrel  75 mg Oral Daily  ? dorzolamide-timolol  1 drop Right Eye BID  ? lactobacillus  1 g Oral TID WC  ? methocarbamol  500 mg Oral TID  ? metoprolol tartrate  25 mg Oral TID  ? multivitamin  1 tablet Oral QHS  ? multivitamin with minerals  1 tablet Oral Daily  ? polycarbophil  625 mg Oral BID  ? prednisoLONE acetate  1 drop Left Eye Q4H  ? ? ?BMET ?   ?Component Value Date/Time  ? NA 134 (L) 04/24/2021 0826  ? K 4.0 04/24/2021 0826  ? CL 101 04/24/2021 0826  ? CO2 23 04/24/2021 0826  ? GLUCOSE 87 04/24/2021 0826  ? BUN 40 (H) 04/24/2021 0826  ?  CREATININE 7.85 (H) 04/24/2021 0826  ? CALCIUM 8.3 (L) 04/24/2021 0826  ? GFRNONAA 8 (L) 04/24/2021 6295  ? GFRAA 5 (L) 08/27/2019 1540  ? ?CBC ?   ?Component Value Date/Time  ? WBC 9.6 04/23/2021 0919  ? RBC 5.11 04/23/2021 0919  ? HGB 11.5 (L) 04/23/2021 0919  ? HCT 40.9 04/23/2021 0919  ? PLT 171 04/23/2021 0919  ? MCV 80.0 04/23/2021 0919  ? MCH 22.5 (L) 04/23/2021 0919  ? MCHC 28.1 (L) 04/23/2021 0919  ? RDW 19.8 (H) 04/23/2021 0919  ? LYMPHSABS 1.6 04/23/2021 0919  ? MONOABS 1.1 (H) 04/23/2021 0919  ? EOSABS 0.6 (H) 04/23/2021 0919  ? BASOSABS 0.1 04/23/2021 0919  ? ? ?Dialysis Orders: Center: [facility]  on TTS . ?EDW 87 kg HD Bath 2K/2.5Ca  Time 3:30 Heparin . Access Left [place] catheter, RUE AVF 1800 units IVP then 400 units/hr BFR  400 DFR 800    ?Micera 150 mcg IVP every 2 weeks, Venofer 50 mg IV TTS, calcitriol 0.5 mcg TTS ?  ?Assessment/Plan: ? Acute bilateral watershed stroke - MRA without obstruction of vessels.  Likely due to hemodynamic changes with HD as he and his wife report frequent drops of BP with HD, however after discussion with his home unit, he did not have any drop in BP during HD and had syncope after HD was completed.  Neuro following. Discussed possible need for transition to PD given risk of recurrence. ? ESRD -  plan for HD today.  Cannot dose with midodrine before HD due to prolonged QT interval.  Discussed with he and his wife about possible transition to PD as above-  they have appropriate questions regarding that today.  No heparin with HD.  Will try to maintain BP in HD.  HD today 04/25/21   ? Hypertension/volume  - stable ? Anemia  - stable-  hgb over 11-  no meds  ? Metabolic bone disease -  noncompliant with meds. Supposed to be on phoslo and calcitriol ? Nutrition - renal diet, carb modified. ?Disposition - in CIR ? ? ? ?Tamya Denardo ? ?[facility] ?[phone] ?

## 2021-04-25 NOTE — Progress Notes (Signed)
removed 736mls net fluid, signed off 30 minutes early.  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s tolerated tx well.  pt upset about ordered 3.5 hours explained to pt rationale and the need for a longer treatment,  pt then upset about fluid removal explained concept of estimated dry weight and how it can changed based on him losing gaining weight etc.. pt quiet throughout treatment. A lot of education post treatment given and pt stated he would try and stay longer but complains of being sick and massive cramps during last hour of treatment, neither occurred today.  pre bp 136/77 post bp 128/80 pre weight 81.4kg bed scales.  post weight 80.00kg .  catheter ran well.  packed with heparin clamped and capped.  gave calcitriol as ordered. ?

## 2021-04-25 NOTE — Plan of Care (Signed)
?  Problem: RH Problem Solving ?Goal: LTG Patient will demonstrate problem solving for (SLP) ?Description: LTG:  Patient will demonstrate problem solving for basic/complex daily situations with cues  (SLP) ?Flowsheets (Taken 04/25/2021 0933) ?LTG: Patient will demonstrate problem solving for (SLP): Complex daily situations ?LTG Patient will demonstrate problem solving for: Supervision ?  ?Problem: RH Memory ?Goal: LTG Patient will demonstrate ability for day to day (SLP) ?Description: LTG:   Patient will demonstrate ability for day to day recall/carryover during cognitive/linguistic activities with assist  (SLP) ?Flowsheets (Taken 04/25/2021 0933) ?LTG: Patient will demonstrate ability for day to day recall: ? New information ? Daily complex information ?LTG: Patient will demonstrate ability for day to day recall/carryover during cognitive/linguistic activities with assist (SLP): Supervision ?Goal: LTG Patient will use memory compensatory aids to (SLP) ?Description: LTG:  Patient will use memory compensatory aids to recall biographical/new, daily complex information with cues (SLP) ?Flowsheets (Taken 04/25/2021 0933) ?LTG: Patient will use memory compensatory aids to (SLP): Supervision ?  ?Problem: RH Attention ?Goal: LTG Patient will demonstrate this level of attention during functional activites (SLP) ?Description: LTG:  Patient will will demonstrate this level of attention during functional activites (SLP) ?Flowsheets (Taken 04/25/2021 0933) ?Patient will demonstrate during cognitive/linguistic activities the attention type of: Alternating ?Patient will demonstrate this level of attention during cognitive/linguistic activities in: ? Controlled ? Home ?LTG: Patient will demonstrate this level of attention during cognitive/linguistic activities with assistance of (SLP): Supervision ?  ?

## 2021-04-25 NOTE — Progress Notes (Signed)
PROGRESS NOTE   Subjective/Complaints: Pt says he had a fair  night. "Ready to get out of this bed!"  ROS: Patient denies fever, rash, sore throat, blurred vision, dizziness, nausea, vomiting, diarrhea, cough, shortness of breath or chest pain, joint or back/neck pain, headache, or mood change.    Objective:   CT HEAD WO CONTRAST (5MM)  Result Date: 04/24/2021 CLINICAL DATA:  51 year old male with neurologic deficit, patchy bilateral superior cortical infarcts on recent MRI. EXAM: CT HEAD WITHOUT CONTRAST TECHNIQUE: Contiguous axial images were obtained from the base of the skull through the vertex without intravenous contrast. RADIATION DOSE REDUCTION: This exam was performed according to the departmental dose-optimization program which includes automated exposure control, adjustment of the mA and/or kV according to patient size and/or use of iterative reconstruction technique. COMPARISON:  Brain MRI 04/21/2021.  Head CT 04/21/2021. FINDINGS: Brain: Bilateral superior frontal gyrus and right posterior parietal cortical infarcts seen recently by MRI remain occult largely occult by CT (series 2, image 23). No associated hemorrhage or mass effect. Stable gray-white matter differentiation elsewhere. The no superimposed midline shift, ventriculomegaly, mass effect, evidence of mass lesion, intracranial hemorrhage or new cortically based infarct. Vascular: Calcified atherosclerosis at the skull base. No suspicious intracranial vascular hyperdensity. Skull: No acute osseous abnormality identified. Sinuses/Orbits: Visualized paranasal sinuses and mastoids are stable and well aerated. Other: Widespread calcified scalp vessel atherosclerosis. Left globe prosthesis. No acute orbit or scalp squamous that suboccipital scalp soft tissue scarring. No acute orbit or scalp soft tissue finding. IMPRESSION: 1. Bilateral patchy cortical infarcts primarily at the  vertex last month remain largely occult by CT. No hemorrhagic transformation or mass effect. 2. No new intracranial abnormality. 3. Extensive calcified scalp vessel atherosclerosis, most commonly observed with End stage renal disease. Electronically Signed   By: Genevie [name] M.D.   On: 04/24/2021 08:14   EEG adult  Result Date: 04/24/2021 Lora Havens, MD     04/24/2021  9:48 AM Patient Name: [name]. MRN: [id number] Epilepsy Attending: Lora Havens Referring Physician/Provider: Deatra James, MD Date: 04/24/2021 Duration: 22.40 mins Patient history: 51 year old male with seizure-like episode.  EEG to evaluate for seizures. Level of alertness: Awake, asleep AEDs during EEG study: None Technical aspects: This EEG study was done with scalp electrodes positioned according to the 10-20 International system of electrode placement. Electrical activity was acquired at a sampling rate of 500Hz  and reviewed with a high frequency filter of 70Hz  and a low frequency filter of 1Hz . EEG data were recorded continuously and digitally stored. Description: The posterior dominant rhythm consists of 9 Hz activity of moderate voltage (25-35 uV) seen predominantly in posterior head regions, symmetric and reactive to eye opening and eye closing. Sleep was characterized by vertex waves, sleep spindles (12 to 14 Hz), maximal frontocentral region. Hyperventilation and photic stimulation were not performed.   IMPRESSION: This study is within normal limits. No seizures or epileptiform discharges were seen throughout the recording. Lora Havens   Recent Labs    04/23/21 0919  WBC 9.6  HGB 11.5*  HCT 40.9  PLT 171   Recent Labs    04/23/21 0919 04/24/21 0826  NA  137 134*  K 5.7* 4.0  CL 107 101  CO2 16* 23  GLUCOSE 91 87  BUN 62* 40*  CREATININE 11.23* 7.85*  CALCIUM 8.7* 8.3*    Intake/Output Summary (Last 24 hours) at 04/25/2021 0901 Last data filed at 04/25/2021 0736 Gross per 24 hour  Intake 240  ml  Output --  Net 240 ml        Physical Exam: Vital Signs Blood pressure (!) 167/65, pulse 62, temperature (!) 100.6 F (38.1 C), temperature source Oral, resp. rate 14, height 6\' 3"  (1.905 m), weight 81.4 kg, SpO2 99 %.  General: Alert and oriented x 3, No apparent distress HEENT: Head is normocephalic, atraumatic, enucleated left eye, oral mucosa pink and moist, edentulous, ext ear canals clear,  Neck: Supple without JVD or lymphadenopathy Heart: Reg rate and rhythm. No murmurs rubs or gallops Chest: CTA bilaterally without wheezes, rales, or rhonchi; no distress Abdomen: Soft, non-tender, non-distended, bowel sounds positive. Extremities: No clubbing, cyanosis, or edema. Pulses are 2+ Psych: [name] affect is appropriate. Pt is cooperative Skin: Clean and intact without signs of breakdown Neuro:  alert and oriented x 3. Left facial droop. Speech generally clear. LUE 1+/5 deltoid to 3-/5 bicep, tricep, wrist/hand. LLE 3+ to 4-/5. RUE and RLE 5/5. Senses pain and light touch in all 4's. No resting tone  Musculoskeletal: normal PROM/AROM    Assessment/Plan: 1. Functional deficits which require 3+ hours per day of interdisciplinary therapy in a comprehensive inpatient rehab setting. Physiatrist is providing close team supervision and 24 hour management of active medical problems listed below. Physiatrist and rehab team continue to assess barriers to discharge/monitor patient progress toward functional and medical goals  Care Tool:  Bathing              Bathing assist Assist Level: Total Assistance - Patient < 25%     Upper Body Dressing/Undressing Upper body dressing Upper body dressing/undressing activity did not occur (including orthotics): Safety/medical concerns      Upper body assist      Lower Body Dressing/Undressing Lower body dressing    Lower body dressing activity did not occur: Safety/medical concerns       Lower body assist        Toileting Toileting    Toileting assist Assist for toileting: Total Assistance - Patient < 25%     Transfers Chair/bed transfer  Transfers assist  Chair/bed transfer activity did not occur: Safety/medical concerns        Locomotion Ambulation   Ambulation assist   Ambulation activity did not occur: Safety/medical concerns          Walk 10 feet activity   Assist           Walk 50 feet activity   Assist           Walk 150 feet activity   Assist           Walk 10 feet on uneven surface  activity   Assist           Wheelchair     Assist               Wheelchair 50 feet with 2 turns activity    Assist            Wheelchair 150 feet activity     Assist          Blood pressure (!) 167/65, pulse 62, temperature (!) 100.6 F (38.1 C), temperature source Oral, resp. rate  14, height 6\' 3"  (1.905 m), weight 81.4 kg, SpO2 99 %.  Medical Problem List and Plan: 1. 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  Antithrombotics: -DVT/anticoagulation:  Pharmaceutical:  -resumed Eliquis             -antiplatelet therapy: Resume Plavix 3. Pain Management: Tylenol prn.  4. Mood: LCSW to follow for evaluation and support.              -antipsychotic agents: NA 5. Neuropsych: This patient may be capable of making decisions on his own behalf. 6. Skin/Wound Care:  Routine pressure relief measures.  7. Fluids/Electrolytes/Nutrition: Strict I/O --not on any fluid restriction at this time.  8. Hypertension: Monitor for orthostatic/syncopal episodes with increase in activity.             --continue metoprolol bid   3/4 bp borderline today--monitor for pattern 9. CAF: Monitor HR TID 10. T2DM w/neuropathy and gastroparesis:  Not on any meds as A1C normalized-4.7.              --BS have been well controlled.  .   11. H/o GERD w/gastritis/esophagitis: 12. ESRD: Continue HD TTS at the end of the day to help with tolerance of therapy.             -- on phoslo and calcitriol for metabolic bone disese.             --not in favor of transitioning to PD. Plans for graft in LUE? 13. Anemia of chronic disease: Stable w/ Hgb @ 11.5 on 03/02. Repeat Hgb tomorrow.  14. Glaucoma: Resume cosopt eye drops.  13. Diarrhea: persistent  -added fiber for bulking.  -on no scheduled softeners or laxatives    LOS: 1 days A FACE TO FACE EVALUATION WAS PERFORMED  Meredith Staggers 04/25/2021, 9:01 AM

## 2021-04-25 NOTE — Evaluation (Signed)
Speech Language Pathology Assessment and Plan  Patient Details  Name: [name]. MRN: [id number] Date of Birth: [birth year]  SLP Diagnosis: Cognitive Impairments  Rehab Potential: Excellent ELOS: 3 weeks   Today's Date: 04/25/2021 SLP Individual Time: 0910-0950 SLP Individual Time Calculation (min): 40 min  Hospital Problem: Principal Problem:   Cerebral ischemic stroke due to global hypoperfusion with watershed infarct [facility])  Past Medical History:  Past Medical History:  Diagnosis Date   Anxiety    Cellulitis    CHF (congestive heart failure) (HCC)    Chronic diarrhea    Chronic kidney disease    Chronic pain of both ankles    Diabetes mellitus without complication ([place])    Diabetes mellitus, type II ([place])    Diabetic neuropathy (HCC)    Esophagitis    Frequent falls    Gait instability    Gastritis    GERD (gastroesophageal reflux disease)    H/O enucleation of left eyeball    Heart palpitations    Hemodialysis patient ([place])    History of kidney stones    HLD (hyperlipidemia)    Hypertension    Insomnia    Nausea and vomiting in adult    recurrent   Past Surgical History:  Past Surgical History:  Procedure Laterality Date   AMPUTATION Left 03/16/2016   Procedure: AMPUTATION DIGIT LEFT HALLUX;  Surgeon: Trula Slade, DPM;  Location: [place];  Service: Podiatry;  Laterality: Left;  can start around 5    AMPUTATION Right 02/09/2021   Procedure: AMPUTATION DIGIT;  Surgeon: Sherilyn Cooter, MD;  Location: [place];  Service: Orthopedics;  Laterality: Right;   ARTHROSCOPIC REPAIR ACL Left    AV FISTULA PLACEMENT Right 05/31/2019   Procedure: Brachiocephalic AV fistula creation;  Surgeon: Algernon Huxley, MD;  Location: [name] ORS;  Service: Vascular;  Laterality: Right;   COLONOSCOPY WITH PROPOFOL N/A 10/28/2015   Procedure: COLONOSCOPY WITH PROPOFOL;  Surgeon: Lollie Sails, MD;  Location: [facility] ENDOSCOPY;  Service: Endoscopy;  Laterality: N/A;   COLONOSCOPY WITH  PROPOFOL N/A 10/29/2015   Procedure: COLONOSCOPY WITH PROPOFOL;  Surgeon: Lollie Sails, MD;  Location[facility] ENDOSCOPY;  Service: Endoscopy;  Laterality: N/A;   CORONARY/GRAFT ACUTE MI REVASCULARIZATION N/A 02/26/2020   Procedure: Coronary/Graft Acute MI Revascularization;  Surgeon: Jettie Booze, MD;  Location: [place] CV LAB;  Service: Cardiovascular;  Laterality: N/A;   DIALYSIS/PERMA CATHETER INSERTION Right 04/26/2019   Perm Cath    DIALYSIS/PERMA CATHETER INSERTION N/A 04/26/2019   Procedure: DIALYSIS/PERMA CATHETER INSERTION;  Surgeon: Algernon Huxley, MD;  Location: [place] CV LAB;  Service: Cardiovascular;  Laterality: N/A;   DIALYSIS/PERMA CATHETER REMOVAL N/A 08/15/2019   Procedure: DIALYSIS/PERMA CATHETER REMOVAL;  Surgeon: Katha Cabal, MD;  Location: [place] CV LAB;  Service: Cardiovascular;  Laterality: N/A;   EMBOLIZATION Right 02/06/2021   Procedure: EMBOLIZATION;  Surgeon: Algernon Huxley, MD;  Location: [place] CV LAB;  Service: Cardiovascular;  Laterality: Right;  Right Upper Extremity Dialysis Access, Permcath Placement   ESOPHAGOGASTRODUODENOSCOPY (EGD) WITH PROPOFOL N/A 12/27/2017   Procedure: ESOPHAGOGASTRODUODENOSCOPY (EGD) WITH PROPOFOL;  Surgeon: Tol[place], Benay Pike, MD;  Location: [name] ENDOSCOPY;  Service: Gastroenterology;  Laterality: N/A;   INTRAVASCULAR ULTRASOUND/IVUS N/A 02/26/2020   Procedure: Intravascular Ultrasound/IVUS;  Surgeon: Jettie Booze, MD;  Location: [place] CV LAB;  Service: Cardiovascular;  Laterality: N/A;   LEFT HEART CATH AND CORONARY ANGIOGRAPHY N/A 02/26/2020   Procedure: LEFT HEART CATH AND CORONARY ANGIOGRAPHY;  Surgeon:  Jettie Booze, MD;  Location: [place] CV LAB;  Service: Cardiovascular;  Laterality: N/A;   PROSTATE SURGERY  2016   TONSILECTOMY/ADENOIDECTOMY WITH MYRINGOTOMY     TONSILLECTOMY     UPPER EXTREMITY ANGIOGRAPHY Right 11/26/2020   Procedure: Upper Extremity Angiography;  Surgeon:  Algernon Huxley, MD;  Location: [place] CV LAB;  Service: Cardiovascular;  Laterality: Right;   UPPER EXTREMITY ANGIOGRAPHY Right 02/05/2021   Procedure: UPPER EXTREMITY ANGIOGRAPHY;  Surgeon: Algernon Huxley, MD;  Location: [name] CV LAB;  Service: Cardiovascular;  Laterality: Right;    Assessment / Plan / Recommendation Clinical Impression  Patient is a 51 year old LH-male with history of T2DM with neuropathy, ESRD- HD TTS, gait disorder w/falls, CVA 12/22, A fib who was originally admitted on [facility] from HD center on 04/21/21 with sudden onset of Left sided weakness after syncopal episode post dialysis. MRI brain done revealing patchy acute infarcts in watershed distribution affecting right parietal lobe and cortical infarcts bilateral high frontal lobes and subcentimer acute infarct in callosal splenium on the left. Stroke felt to be secondary to hypoperfusion due to intradialytic hypotensive episode.  Patient transferred to CIR on 04/24/2021.  Pt presents with mild cognitive impairment, primarily in areas of memory, attention and problem solving. Pt with a score of 13/24 on SLUMS (adjusted due to visual impairments) which is the same score as previous SLUMS administration on 02/13/21. Pt endorses an acute decline in memory function and wants to maintain as much independence as possible with iADLs (wife assists with med/money management at baseline). Pt with strengths in orientation, awareness of current deficits and verbal fluency. Pt speaks with low vocal intensity which pt and spouse report is baseline from previous CVA, no slurred speech and intelligibility is 100% at conversation level. Expressive/receptive language judged to be WNL. Pt responds well to verbal cues during evaluation to increase accuracy of tasks. Pt will benefit from skilled ST during CIR to increase safety and independence with daily routine and reduce caregiver burden.   Skilled Therapeutic Interventions          Pt  participating in [place] Mental Status Examination and further non-standardized assessments of speech, language and cognition. Please see above.   SLP Assessment  Patient will need skilled [facility] during CIR admission    Recommendations  Oral Care Recommendations: Oral care BID Patient destination: Home Follow up Recommendations: Other (comment) (intermittent supervision with complex cognitive tasks) Equipment Recommended: None recommended by SLP    SLP Frequency 3 to 5 out of 7 days   SLP Duration  SLP Intensity  SLP Treatment/Interventions 3 weeks  Minumum of 1-2 x/day, 30 to 90 minutes  Cognitive remediation/compensation;Environmental controls;Internal/external aids;Therapeutic Activities;Patient/family education;Functional tasks    Pain Pain Assessment Pain Scale: 0-10 Pain Score: 0-No pain  Prior Functioning Cognitive/Linguistic Baseline: Baseline deficits Baseline deficit details: previous CVA and multiple medical hx and dx Type of Home: House  Lives With: Spouse Available Help at Discharge: Family;Available PRN/intermittently Education: some college  SLP Evaluation Cognition Overall Cognitive Status: Impaired/Different from baseline Arousal/Alertness: Awake/alert Orientation Level: Oriented X4 Year: 2023 Month: March Day of Week: Correct Attention: Sustained Sustained Attention: Impaired Sustained Attention Impairment: Verbal basic Memory: Impaired Memory Impairment: Retrieval deficit;Decreased recall of new information Awareness: Appears intact Problem Solving: Impaired Problem Solving Impairment: Verbal complex;Functional complex Safety/Judgment: Appears intact  Comprehension Auditory Comprehension Overall Auditory Comprehension: Appears within functional limits for tasks assessed Expression Expression Primary Mode of Expression: Verbal Verbal  Expression Overall Verbal Expression: Appears within functional  limits for tasks assessed Oral Motor Oral Motor/Sensory Function Overall Oral Motor/Sensory Function: Within functional limits Motor Speech Overall Motor Speech: Impaired at baseline Phonation: Low vocal intensity Intelligibility: Intelligible  Care Tool Care Tool Cognition Ability to hear (with hearing aid or hearing appliances if normally used Ability to hear (with hearing aid or hearing appliances if normally used): 0. Adequate - no difficulty in normal conservation, social interaction, listening to TV   Expression of Ideas and Wants Expression of Ideas and Wants: 3. Some difficulty - exhibits some difficulty with expressing needs and ideas (e.g, some words or finishing thoughts) or speech is not clear   Understanding Verbal and Non-Verbal Content Understanding Verbal and Non-Verbal Content: 4. Understands (complex and basic) - clear comprehension without cues or repetitions  Memory/Recall Ability Memory/Recall Ability : Current season;That he or she is in a hospital/hospital unit;Staff names and faces   Short Term Goals: Week 1: SLP Short Term Goal 1 (Week 1): Pt will utilize external aids to recall novel information with min assist. SLP Short Term Goal 2 (Week 1): Patient will utilize compensatory memory strategies during functional tasks with Supervision SLP Short Term Goal 3 (Week 1): Pt will produce solutions to moderately complex problems with min A cues SLP Short Term Goal 4 (Week 1): Patient will complete mildly complex planning/scheduling tasks with min A verbal cues SLP Short Term Goal 5 (Week 1): Pt will complete alternating attention tasks with min A verbal cues for accuracy  Refer to Care Plan for Long Term Goals  Recommendations for other services: None   Discharge Criteria: Patient will be discharged from SLP if patient refuses treatment 3 consecutive times without medical reason, if treatment goals not met, if there is a change in medical status, if patient makes no  progress towards goals or if patient is discharged from hospital.  The above assessment, treatment plan, treatment alternatives and goals were discussed and mutually agreed upon: by patient and by family  Dewaine Conger 04/25/2021, 9:47 AM

## 2021-04-26 LAB — C DIFFICILE QUICK SCREEN W PCR REFLEX
C Diff antigen: POSITIVE — AB
C Diff interpretation: DETECTED
C Diff toxin: POSITIVE — AB

## 2021-04-26 LAB — GLUCOSE, CAPILLARY
Glucose-Capillary: 94 mg/dL (ref 70–99)
Glucose-Capillary: 81 mg/dL (ref 70–99)

## 2021-04-26 MED ORDER — METRONIDAZOLE 500 MG PO TABS
500.0000 mg | ORAL_TABLET | Freq: Three times a day (TID) | ORAL | Status: DC
  Administered 2021-04-26 – 2021-04-27 (×3): 500 mg via ORAL
  Filled 2021-04-26 (×3): qty 1

## 2021-04-26 MED ORDER — CHOLESTYRAMINE 4 G PO PACK
4.0000 g | PACK | Freq: Two times a day (BID) | ORAL | Status: DC
  Administered 2021-04-26 – 2021-04-27 (×4): 4 g via ORAL
  Filled 2021-04-26 (×5): qty 1

## 2021-04-26 NOTE — Progress Notes (Signed)
Stool + fo C. Diff. Begin flagyl 500mg  q8 hours x 10 days.  Spoke to patient and wife. Apparently has had diarrhea for months prior to this hospitalization according to wife. May need taper of flagyl after those 10 days given history.  ? ?Meredith Staggers, MD, FAAPMR ?[facility] ?Medical Director Rehabilitation Services ?04/26/2021  ?

## 2021-04-26 NOTE — ED Provider Notes (Signed)
[facility] Provider Note   CSN: [id number] Arrival date & time: 04/21/21  1000     History  Chief Complaint  Patient presents with   Code Stroke    [name]. is a 51 y.o. male.  HPI    This is a 51 yo gentleman with hx CHF, CKD, DM2, ESRD on dialysis, a fib on eliquis, HTN, HL who was brought in as preactivated stroke code after an incident of syncope during dialysis after which he woke up with some LUE weakness.   Indicates that he has had a stroke before, but with different symptoms than current. Felt well when he went to HD. Indicates compliance with eliquis.  Home Medications Prior to Admission medications   Medication Sig Start Date End Date Taking? Authorizing Provider  amiodarone (PACERONE) 200 MG tablet Take 1 tablet (200 mg total) by mouth daily 02/10/21  Yes Charlynne Cousins, MD  apixaban (ELIQUIS) 5 MG TABS tablet Take 1 tablet (5 mg total) by mouth 2 (two) times daily. 02/10/21  Yes Charlynne Cousins, MD  atorvastatin (LIPITOR) 80 MG tablet Take 1 tablet (80 mg total) by mouth daily. 04/20/21 04/15/22 Yes Loel Dubonnet, NP  carvedilol (COREG) 25 MG tablet Take 1 tablet (25 mg total) by mouth 2 (two) times daily with a meal. 02/10/21  Yes Charlynne Cousins, MD  clopidogrel (PLAVIX) 75 MG tablet Take 1 tablet (75 mg total) by mouth daily. 02/10/21  Yes Charlynne Cousins, MD  dorzolamide-timolol (COSOPT) 22.3-6.8 MG/ML ophthalmic solution Place 1 drop into the right eye 2 (two) times daily.   Yes [provider]  nitroGLYCERIN (NITROSTAT) 0.4 MG SL tablet Place 1 tablet (0.4 mg total) under the tongue every 5 (five) minutes as needed for chest pain. 12/09/20 12/09/21 Yes Johnson, Clanford L, MD  tamsulosin (FLOMAX) 0.4 MG CAPS capsule Take 0.4 mg by mouth daily after breakfast.    Yes [provider]  calcitRIOL (ROCALTROL) 0.5 MCG capsule Take 1 capsule (0.5 mcg total) by mouth Every Tuesday,Thursday,and  Saturday with dialysis. 04/25/21 05/25/21  Shahmehdi, Valeria Batman, MD  methocarbamol (ROBAXIN) 500 MG tablet Take 1 tablet (500 mg total) by mouth 3 (three) times daily for 10 days. 04/24/21 05/04/21  Deatra James, MD  Multiple Vitamin (MULTIVITAMIN WITH MINERALS) TABS tablet Take 1 tablet by mouth daily. 04/25/21 05/25/21  Deatra James, MD      Allergies    Levofloxacin, Promethazine, and Malt    Review of Systems   Review of Systems  All other systems reviewed and are negative.  Physical Exam Updated Vital Signs BP (!) 159/84    Pulse 66    Temp 98.3 F (36.8 C) (Oral)    Resp 17    Ht 6\' 3"  (1.905 m)    Wt 81.6 kg    SpO2 100%    BMI 22.49 kg/m  Physical Exam Vitals and nursing note reviewed.  Constitutional:      Appearance: He is well-developed.  HENT:     Head: Atraumatic.  Cardiovascular:     Rate and Rhythm: Normal rate.  Pulmonary:     Effort: Pulmonary effort is normal.  Musculoskeletal:     Cervical back: Neck supple.  Skin:    General: Skin is warm.  Neurological:     Mental Status: He is alert and oriented to person, place, and time.     Comments: LUE drift, LLE strength 4+/5, mild numbness to the LUE, EOMI,  PERRL, no slurring    ED Results / Procedures / Treatments   Labs (all labs ordered are listed, but only abnormal results are displayed) Labs Reviewed  CBC - Abnormal; Notable for the following components:      Result Value   Hemoglobin 11.4 (*)    HCT 37.5 (*)    MCV 79.1 (*)    MCH 24.1 (*)    RDW 20.4 (*)    All other components within normal limits  COMPREHENSIVE METABOLIC PANEL - Abnormal; Notable for the following components:   BUN 28 (*)    Creatinine, Ser 6.63 (*)    Calcium 8.5 (*)    Albumin 3.3 (*)    AST 14 (*)    GFR, Estimated 9 (*)    All other components within normal limits  BASIC METABOLIC PANEL - Abnormal; Notable for the following components:   CO2 21 (*)    BUN 43 (*)    Creatinine, Ser 8.77 (*)    Calcium 8.5 (*)    GFR,  Estimated 7 (*)    All other components within normal limits  CBC - Abnormal; Notable for the following components:   Hemoglobin 10.7 (*)    HCT 36.7 (*)    MCH 23.3 (*)    MCHC 29.2 (*)    RDW 20.2 (*)    All other components within normal limits  HEMOGLOBIN A1C - Abnormal; Notable for the following components:   Hgb A1c MFr Bld 4.7 (*)    All other components within normal limits  GLUCOSE, CAPILLARY - Abnormal; Notable for the following components:   Glucose-Capillary 107 (*)    All other components within normal limits  GLUCOSE, CAPILLARY - Abnormal; Notable for the following components:   Glucose-Capillary 102 (*)    All other components within normal limits  COMPREHENSIVE METABOLIC PANEL - Abnormal; Notable for the following components:   Potassium 5.7 (*)    CO2 16 (*)    BUN 62 (*)    Creatinine, Ser 11.23 (*)    Calcium 8.7 (*)    Albumin 3.2 (*)    AST 12 (*)    GFR, Estimated 5 (*)    All other components within normal limits  CBC WITH DIFFERENTIAL/PLATELET - Abnormal; Notable for the following components:   Hemoglobin 11.5 (*)    MCH 22.5 (*)    MCHC 28.1 (*)    RDW 19.8 (*)    Monocytes Absolute 1.1 (*)    Eosinophils Absolute 0.6 (*)    All other components within normal limits  PROLACTIN - Abnormal; Notable for the following components:   Prolactin 331.0 (*)    All other components within normal limits  GLUCOSE, CAPILLARY - Abnormal; Notable for the following components:   Glucose-Capillary 65 (*)    All other components within normal limits  GLUCOSE, CAPILLARY - Abnormal; Notable for the following components:   Glucose-Capillary 102 (*)    All other components within normal limits  GLUCOSE, CAPILLARY - Abnormal; Notable for the following components:   Glucose-Capillary 106 (*)    All other components within normal limits  BASIC METABOLIC PANEL - Abnormal; Notable for the following components:   Sodium 134 (*)    BUN 40 (*)    Creatinine, Ser 7.85 (*)     Calcium 8.3 (*)    GFR, Estimated 8 (*)    All other components within normal limits  RESP PANEL BY RT-PCR (FLU A&B, COVID) ARPGX2  ETHANOL  PROTIME-INR  APTT  DIFFERENTIAL  TSH  LIPID PANEL  GLUCOSE, CAPILLARY  GLUCOSE, CAPILLARY  GLUCOSE, CAPILLARY  GLUCOSE, CAPILLARY  GLUCOSE, CAPILLARY  GLUCOSE, CAPILLARY  GLUCOSE, CAPILLARY  GLUCOSE, CAPILLARY  I-STAT CHEM 8, ED    EKG EKG Interpretation  Date/Time:  Tuesday April 21 2021 10:31:47 EST Ventricular Rate:  76 PR Interval:  163 QRS Duration: 110 QT Interval:  423 QTC Calculation: 476 R Axis:   93 Text Interpretation: Age not entered, assumed to be  51 years old for purpose of ECG interpretation Sinus rhythm Borderline right axis deviation LVH with IVCD and secondary repol abnrm Anterior ST elevation, probably due to LVH Borderline prolonged QT interval Confirmed by Ronnald Nian, Adam ([id number]) on 04/22/2021 8:45:03 AM  Radiology No results found.  Procedures .Critical Care Performed by: Varney Biles, MD Authorized by: Varney Biles, MD   Critical care provider statement:    Critical care time (minutes):  38   Critical care was necessary to treat or prevent imminent or life-threatening deterioration of the following conditions:  CNS failure or compromise   Critical care was time spent personally by me on the following activities:  Development of treatment plan with patient or surrogate, discussions with consultants, evaluation of patient's response to treatment, examination of patient, ordering and review of laboratory studies, ordering and review of radiographic studies, ordering and performing treatments and interventions, pulse oximetry, re-evaluation of patient's condition and review of old charts    Medications Ordered in ED Medications  iohexol (OMNIPAQUE) 350 MG/ML injection 75 mL (75 mLs Intravenous Contrast Given 04/21/21 1012)  metoprolol tartrate (LOPRESSOR) injection 5 mg (5 mg Intravenous Given 04/21/21  1344)  diphenhydrAMINE (BENADRYL) injection 12.5 mg (12.5 mg Intravenous Given 04/24/21 0612)    ED Course/ Medical Decision Making/ A&P                           Medical Decision Making Amount and/or Complexity of Data Reviewed Labs: ordered. Radiology: ordered.  Risk Prescription drug management. Decision regarding hospitalization.   51 yo gentleman with hx CHF, CKD, DM2, ESRD on dialysis, a fib on eliquis, HTN, HL who was brought in as preactivated stroke code after an incident of syncope during dialysis after which he woke up with some LUE weakness.   On exam, clear LUE weakness. He is blind - but denies any new vision change and has no slurred speech.  Concerns for stroke, especially given hx of AF and vascular disorder hx. Other possibilities include cervical nerve impingement.  Code stroke was activated as patient is well within treatment window and we wanted to ensure that if patient is a candidate for thrombectomy, he can get the procedure. He is on eliquis, so not a candidate for TPA.  Neurology consulted - they recommend admission for further stroke workup. Confirm that there is no large clot for removal on CT-A.  CT head visualized independently to ensure there is no acute bleed - it is neg for bleed.  Final Clinical Impression(s) / ED Diagnoses Final diagnoses:  Acute ischemic stroke [facility])    Rx / DC Orders ED Discharge Orders          Ordered    calcitRIOL (ROCALTROL) 0.5 MCG capsule  Every T-Th-Sa (Hemodialysis)        04/24/21 1027    Multiple Vitamin (MULTIVITAMIN WITH MINERALS) TABS tablet  Daily        04/24/21 1027    methocarbamol (ROBAXIN) 500 MG tablet  3 times daily        04/24/21 1027    Discharge instructions       Comments: Follow-up with neurologist within 1-2 weeks   04/24/21 1027    Activity as tolerated - No restrictions        04/24/21 1027    Increase activity slowly        04/24/21 1027    Diet - low sodium heart healthy         04/24/21 1027    No wound care        04/24/21 1027    No dressing needed        04/24/21 1027    Call MD for:  temperature >100.4        04/24/21 1027    Call MD for:  redness, tenderness, or signs of infection (pain, swelling, redness, odor or green/yellow discharge around incision site)        04/24/21 1027    Call MD for:  difficulty breathing, headache or visual disturbances        04/24/21 1027    Call MD for:  persistant dizziness or light-headedness        04/24/21 1027              Varney Biles, MD 04/26/21 1406

## 2021-04-26 NOTE — Progress Notes (Signed)
?                                                       PROGRESS NOTE ? ? ?Subjective/Complaints: ?Had questions about cbg's and whether he still needed them checked. Stools more liquidy and frequent yesterday/last night. Tells me his wife took samples to [facility]? ? ?ROS: Patient denies fever, rash, sore throat, blurred vision, dizziness, nausea, vomiting,  cough, shortness of breath or chest pain, joint or back/neck pain, headache, or mood change.  ? ? ?Objective: ?  ?EEG adult ? ?Result Date: 04/24/2021 ?Lora Havens, MD     04/24/2021  9:48 AM Patient Name: [name]. MRN: [id number] Epilepsy Attending: Lora Havens Referring Physician/Provider: Deatra James, MD Date: 04/24/2021 Duration: 22.40 mins Patient history: 51 year old male with seizure-like episode.  EEG to evaluate for seizures. Level of alertness: Awake, asleep AEDs during EEG study: None Technical aspects: This EEG study was done with scalp electrodes positioned according to the 10-20 International system of electrode placement. Electrical activity was acquired at a sampling rate of 500Hz  and reviewed with a high frequency filter of 70Hz  and a low frequency filter of 1Hz . EEG data were recorded continuously and digitally stored. Description: The posterior dominant rhythm consists of 9 Hz activity of moderate voltage (25-35 uV) seen predominantly in posterior head regions, symmetric and reactive to eye opening and eye closing. Sleep was characterized by vertex waves, sleep spindles (12 to 14 Hz), maximal frontocentral region. Hyperventilation and photic stimulation were not performed.   IMPRESSION: This study is within normal limits. No seizures or epileptiform discharges were seen throughout the recording. Priyanka [name]   ?Recent Labs  ?  04/23/21 ?1062 04/25/21 ?1224  ?WBC 9.6 7.4  ?HGB 11.5* 12.0*  ?HCT 40.9 39.4  ?PLT 171 160  ? ?Recent Labs  ?  04/24/21 ?0826 04/25/21 ?1224  ?NA 134* 136  ?K 4.0 4.6  ?CL 101 102  ?CO2 23 18*   ?GLUCOSE 87 100*  ?BUN 40* 57*  ?CREATININE 7.85* 10.18*  ?CALCIUM 8.3* 8.9  ? ? ?Intake/Output Summary (Last 24 hours) at 04/26/2021 0800 ?Last data filed at 04/25/2021 1637 ?Gross per 24 hour  ?Intake 117 ml  ?Output 777 ml  ?Net -660 ml  ?  ? ?  ? ?Physical Exam: ?Vital Signs ?Blood pressure (!) 174/90, pulse 60, temperature 98.2 ?F (36.8 ?C), resp. rate 18, height 6\' 3"  (1.905 m), weight 81.2 kg, SpO2 97 %. ? ?Constitutional: No distress . Vital signs reviewed. ?HEENT: NCAT, EOMI, oral membranes moist. Left eye enucleated ?Neck: supple ?Cardiovascular: RRR without murmur. No JVD    ?Respiratory/Chest: CTA Bilaterally without wheezes or rales. Normal effort    ?GI/Abdomen: BS +, non-tender, non-distended ?Ext: no clubbing, cyanosis, or edema ?Psych: pleasant and cooperative  ?Skin: Clean and intact without signs of breakdown ?Neuro:  alert and oriented x 3. Left facial droop. Speech generally clear. LUE 1+/5 deltoid to 3-/5 bicep, tricep, wrist/hand. LLE 3+ to 4-/5. RUE and RLE 5/5. Senses pain and light touch in all 4's. No resting tone--stable exam  ?Musculoskeletal: normal PROM/AROM  ? ? ?Assessment/Plan: ?1. Functional deficits which require 3+ hours per day of interdisciplinary therapy in a comprehensive inpatient rehab setting. ?Physiatrist is providing close team supervision and 24 hour management of active medical problems listed below. ?  Physiatrist and rehab team continue to assess barriers to discharge/monitor patient progress toward functional and medical goals ? ?Care Tool: ? ?Bathing ?   ?Body parts bathed by patient: Left arm, Right upper leg, Left upper leg, Face  ? Body parts bathed by helper: Chest, Abdomen, Right arm, Front perineal area, Buttocks, Right lower leg, Left lower leg ?  ?  ?Bathing assist Assist Level: Total Assistance - Patient < 25% ?  ?  ?Upper Body Dressing/Undressing ?Upper body dressing Upper body dressing/undressing activity did not occur (including orthotics): Safety/medical  concerns ?What is the patient wearing?: Pull over shirt ?   ?Upper body assist Assist Level: Maximal Assistance - Patient 25 - 49% ?   ?Lower Body Dressing/Undressing ?Lower body dressing ? ? ? Lower body dressing activity did not occur: Safety/medical concerns ?What is the patient wearing?: Incontinence brief, Pants ? ?  ? ?Lower body assist Assist for lower body dressing: Total Assistance - Patient < 25% ?   ? ?Toileting ?Toileting    ?Toileting assist Assist for toileting: Total Assistance - Patient < 25% ?  ?  ?Transfers ?Chair/bed transfer ? ?Transfers assist ? Chair/bed transfer activity did not occur: Safety/medical concerns ? ?Chair/bed transfer assist level: Moderate Assistance - Patient 50 - 74% ?  ?  ?Locomotion ?Ambulation ? ? ?Ambulation assist ? ? Ambulation activity did not occur: Safety/medical concerns ? ?Assist level: Moderate Assistance - Patient 50 - 74% ?  ?Max distance: 30  ? ?Walk 10 feet activity ? ? ?Assist ?   ? ?Assist level: Moderate Assistance - Patient - 50 - 74% ?   ? ?Walk 50 feet activity ? ? ?Assist   ? ?Assist level: Moderate Assistance - Patient - 50 - 74% ?   ? ? ?Walk 150 feet activity ? ? ?Assist Walk 150 feet activity did not occur: Safety/medical concerns ? ?  ?  ?  ? ?Walk 10 feet on uneven surface  ?activity ? ? ?Assist Walk 10 feet on uneven surfaces activity did not occur: Safety/medical concerns ? ? ?  ?   ? ?Wheelchair ? ? ? ? ?Assist Is the patient using a wheelchair?: Yes ?Type of Wheelchair: Manual ?  ? ?Wheelchair assist level: Dependent - Patient 0% ?Max wheelchair distance: 150  ? ? ?Wheelchair 50 feet with 2 turns activity ? ? ? ?Assist ? ?  ?  ? ? ?Assist Level: Dependent - Patient 0%  ? ?Wheelchair 150 feet activity  ? ? ? ?Assist ?   ? ? ?Assist Level: Dependent - Patient 0%  ? ?Blood pressure (!) 174/90, pulse 60, temperature 98.2 ?F (36.8 ?C), resp. rate 18, height 6\' 3"  (1.905 m), weight 81.2 kg, SpO2 97 %. ? ?Medical Problem List and Plan: ?1. Functional  deficits secondary to cerebral ischemic stroke, bilateral due to global hypoperfusion ?            -patient may shower ?            -ELOS/Goals: 10-14 days S ?            -Continue CIR therapies including PT, OT, and SLP   ?2.  Antithrombotics: ?-DVT/anticoagulation:  Pharmaceutical:  -resumed Eliquis ?            -antiplatelet therapy: Resume Plavix ?3. Pain Management: Tylenol prn.  ?4. Mood: LCSW to follow for evaluation and support.  ?            -antipsychotic agents: NA ?5. Neuropsych: This patient may be  capable of making decisions on his own behalf. ?6. Skin/Wound Care:  Routine pressure relief measures.  ?7. Fluids/Electrolytes/Nutrition: Strict I/O --not on any fluid restriction at this time.  ?8. Hypertension: Monitor for orthostatic/syncopal episodes with increase in activity. ?            --continue metoprolol tid  ? 3/5 S>DBP elevation. May need to consider adding second agent ?9. CAF: Monitor HR TID ?10. T2DM w/neuropathy and gastroparesis: Not on any meds as A1C normalized-4.7.  ?            --BS have been well controlled.--can dc cbg's and SSI  .   ?11. H/o GERD w/gastritis/esophagitis: ?48. ESRD: Continue HD TTS at the end of the day to help with tolerance of therapy. ?            -- on phoslo and calcitriol for metabolic bone disese. ?            --not in favor of transitioning to PD. Plans for graft in LUE? ?13. Anemia of chronic disease: Stable w/ Hgb @ 11.5 on 03/02. Repeat Hgb tomorrow.  ?14. Glaucoma: Resume cosopt eye drops.  ?13. Diarrhea: persistent, increased over last 24 hours ? -wbc's normal but significant odor per nursing ?-check stool for c diff ?-added fiber for bulking.  ?-add cholestyramine also ?-on no scheduled softeners or laxatives ?  ? ?LOS: ?2 days ?A FACE TO FACE EVALUATION WAS PERFORMED ? ?Meredith Staggers ?04/26/2021, 8:00 AM  ? ?  ?

## 2021-04-26 NOTE — Progress Notes (Signed)
Patient ID: [name]., male   DOB: 10/06/1970, 51 y.o.   MRN: [id number] ? ?S: HD yesterday, signed off early.  Discussed today the importance of staying on full time.   ? ? ?O:BP (!) 174/90 (BP Location: Left Arm)   Pulse 60   Temp 98.2 ?F (36.8 ?C)   Resp 18   Ht 6\' 3"  (1.905 m)   Wt 81.2 kg   SpO2 97%   BMI 22.38 kg/m?  ? ?Intake/Output Summary (Last 24 hours) at 04/26/2021 1132 ?Last data filed at 04/26/2021 6295 ?Gross per 24 hour  ?Intake 234 ml  ?Output 777 ml  ?Net -543 ml  ? ?Intake/Output: ?I/O last 3 completed shifts: ?In: 90 [P.O.:237] ?Out: 777 [Other:777] ? Intake/Output this shift: ? Total I/O ?In: 117 [P.O.:117] ?Out: -  ?Weight change: 0 kg ?Gen:NAD ?CVS: RRR ?Resp:CTA ?Abd: +BS, soft, NT/ND ?Ext: no edema ?ACCESS: R IJ TDC, R nonfunctioning AVF ?Neuro: L eye closed, L sided weakness ? ?Recent Labs  ?Lab 04/21/21 ?1004 04/22/21 ?2841 04/23/21 ?3244 04/24/21 ?0102 04/25/21 ?1224  ?NA 138 136 137 134* 136  ?K 3.7 4.3 5.7* 4.0 4.6  ?CL 103 105 107 101 102  ?CO2 23 21* 16* 23 18*  ?GLUCOSE 91 70 91 87 100*  ?BUN 28* 43* 62* 40* 57*  ?CREATININE 6.63* 8.77* 11.23* 7.85* 10.18*  ?ALBUMIN 3.3*  --  3.2*  --  3.0*  ?CALCIUM 8.5* 8.5* 8.7* 8.3* 8.9  ?PHOS  --   --   --   --  7.8*  ?AST 14*  --  12*  --   --   ?ALT 11  --  11  --   --   ? ?Liver Function Tests: ?Recent Labs  ?Lab 04/21/21 ?1004 04/23/21 ?7253 04/25/21 ?1224  ?AST 14* 12*  --   ?ALT 11 11  --   ?ALKPHOS 80 81  --   ?BILITOT 0.7 0.6  --   ?PROT 6.9 6.6  --   ?ALBUMIN 3.3* 3.2* 3.0*  ? ?No results for input(s): LIPASE, AMYLASE in the last 168 hours. ?No results for input(s): AMMONIA in the last 168 hours. ?CBC: ?Recent Labs  ?Lab 04/21/21 ?1004 04/22/21 ?6644 04/23/21 ?0347 04/25/21 ?1224  ?WBC 6.0 9.3 9.6 7.4  ?NEUTROABS 3.8  --  6.2  --   ?HGB 11.4* 10.7* 11.5* 12.0*  ?HCT 37.5* 36.7* 40.9 39.4  ?MCV 79.1* 80.0 80.0 77.9*  ?PLT 156 156 171 160  ? ?Cardiac Enzymes: ?No results for input(s): CKTOTAL, CKMB, CKMBINDEX, TROPONINI in the  last 168 hours. ?CBG: ?Recent Labs  ?Lab 04/25/21 ?4259 04/25/21 ?1205 04/25/21 ?1722 04/26/21 ?5638 04/26/21 ?7564  ?GLUCAP 88 108* 71 94 81  ? ? ?Iron Studies: No results for input(s): IRON, TIBC, TRANSFERRIN, FERRITIN in the last 72 hours. ?Studies/Results: ?No results found. ? amiodarone  200 mg Oral Daily  ? apixaban  5 mg Oral BID  ? artificial tears   Left Eye QID  ? atorvastatin  80 mg Oral Daily  ? calcitRIOL  0.5 mcg Oral Q T,Th,Sa-HD  ? calcium acetate  1,334 mg Oral TID WC  ? cholestyramine  4 g Oral BID  ? clopidogrel  75 mg Oral Daily  ? dorzolamide-timolol  1 drop Right Eye BID  ? lactobacillus  1 g Oral TID WC  ? methocarbamol  500 mg Oral TID  ? metoprolol tartrate  25 mg Oral TID  ? metroNIDAZOLE  500 mg Oral Q8H  ? multivitamin  1 tablet  Oral QHS  ? multivitamin with minerals  1 tablet Oral Daily  ? polycarbophil  625 mg Oral BID  ? prednisoLONE acetate  1 drop Left Eye Q4H  ? ? ?BMET ?   ?Component Value Date/Time  ? NA 136 04/25/2021 1224  ? K 4.6 04/25/2021 1224  ? CL 102 04/25/2021 1224  ? CO2 18 (L) 04/25/2021 1224  ? GLUCOSE 100 (H) 04/25/2021 1224  ? BUN 57 (H) 04/25/2021 1224  ? CREATININE 10.18 (H) 04/25/2021 1224  ? CALCIUM 8.9 04/25/2021 1224  ? GFRNONAA 6 (L) 04/25/2021 1224  ? GFRAA 5 (L) 08/27/2019 1540  ? ?CBC ?   ?Component Value Date/Time  ? WBC 7.4 04/25/2021 1224  ? RBC 5.06 04/25/2021 1224  ? HGB 12.0 (L) 04/25/2021 1224  ? HCT 39.4 04/25/2021 1224  ? PLT 160 04/25/2021 1224  ? MCV 77.9 (L) 04/25/2021 1224  ? MCH 23.7 (L) 04/25/2021 1224  ? MCHC 30.5 04/25/2021 1224  ? RDW 18.8 (H) 04/25/2021 1224  ? LYMPHSABS 1.6 04/23/2021 0919  ? MONOABS 1.1 (H) 04/23/2021 0919  ? EOSABS 0.6 (H) 04/23/2021 0919  ? BASOSABS 0.1 04/23/2021 0919  ? ? ?Dialysis Orders: Center: [facility]  on TTS . ?EDW 87 kg HD Bath 2K/2.5Ca  Time 3:30 Heparin . Access Left  catheter, RUE AVF 1800 units IVP then 400 units/hr BFR 400 DFR 800    ?Micera 150 mcg IVP every 2 weeks, Venofer 50 mg IV TTS,  calcitriol 0.5 mcg TTS ?  ?Assessment/Plan: ? Acute bilateral watershed stroke - MRA without obstruction of vessels.  Likely due to hemodynamic changes with HD as he and his wife report frequent drops of BP with HD, however after discussion with his home unit, he did not have any drop in BP during HD and had syncope after HD was completed.  Neuro following. Discussed possible need for transition to PD given risk of recurrence. ? ESRD -  plan for HD on TTS schedule  Cannot dose with midodrine before HD due to prolonged QT interval.  Discussed with he and his wife about possible transition to PD as above-  they have appropriate questions regarding that today.  No heparin with HD.  Will try to maintain BP in HD.  Next HD 04/28/21 ? Hypertension/volume  - stable ? Anemia  - stable-  hgb over 11-  no meds  ? Metabolic bone disease -  noncompliant with meds. Supposed to be on phoslo and calcitriol ? Nutrition - renal diet, carb modified. ?Disposition - in CIR ? ? ? ?Lui Bellis ? ?[facility] ?[phone] ?

## 2021-04-27 MED ORDER — PHENYLEPHRINE IN HARD FAT 0.25 % RE SUPP
1.0000 | Freq: Two times a day (BID) | RECTAL | Status: DC | PRN
  Filled 2021-04-27: qty 1

## 2021-04-27 MED ORDER — PHENYLEPHRINE-MINERAL OIL-PET 0.25-14-74.9 % RE OINT
1.0000 "application " | TOPICAL_OINTMENT | Freq: Two times a day (BID) | RECTAL | Status: DC | PRN
  Filled 2021-04-27 (×2): qty 57

## 2021-04-27 MED ORDER — HYDROCORTISONE ACETATE 25 MG RE SUPP
25.0000 mg | Freq: Two times a day (BID) | RECTAL | Status: DC
  Administered 2021-04-27: 25 mg via RECTAL
  Filled 2021-04-27 (×2): qty 1

## 2021-04-27 MED ORDER — WITCH HAZEL-GLYCERIN EX PADS
MEDICATED_PAD | CUTANEOUS | Status: DC | PRN
  Filled 2021-04-27: qty 100

## 2021-04-27 MED ORDER — VANCOMYCIN HCL 125 MG PO CAPS
125.0000 mg | ORAL_CAPSULE | Freq: Four times a day (QID) | ORAL | Status: DC
  Administered 2021-04-27 – 2021-05-04 (×27): 125 mg via ORAL
  Filled 2021-04-27 (×31): qty 1

## 2021-04-27 MED ORDER — PHENYLEPHRINE IN HARD FAT 0.25 % RE SUPP
1.0000 | Freq: Two times a day (BID) | RECTAL | Status: DC
  Filled 2021-04-27 (×2): qty 1

## 2021-04-27 NOTE — Progress Notes (Signed)
Occupational Therapy Session Note ? ?Patient Details  ?Name: [name]. ?MRN: [id number] ?Date of Birth: 12-14-1970 ? ?Today's Date: 04/27/2021 ?OT Individual Time: 9798-9211 ?OT Individual Time Calculation (min): 60 min  ? ? ?Short Term Goals: ?Week 1:  OT Short Term Goal 1 (Week 1): Pt will bathe UB with no A except to facilitate LUE To wash RUE + min cuing ?OT Short Term Goal 2 (Week 1): Pt will completes oral care wiht MOD HOH A ?OT Short Term Goal 3 (Week 1): Pt will recall hemi dressing techniques wiht MIN VC ?OT Short Term Goal 4 (Week 1): Pt will transfer to toilet wiht MIN A ? ?Skilled Therapeutic Interventions/Progress Updates:  ?  Pt semi reclined in bed, just finishing with MD. [name] soreness and tenderness at IV site and guarding LUE due to this.  MD made aware. Pt perseverative and c/o not having enough to eat for breakfast and concerned about losing too much weight.  Therapist called dietary and requested pt receive extra order of eggs and fruit.  Pt completed supine to sit with mod assist.  Stand pivot to w/c with min assist.  Pt self propelled w/c using RUE and BLE to sink.  Pt required frequent Vcs for initiation, sequencing, and problem solving during UB/LB bathing and dressing.  Pt bathed UB/LB with max assist. Donned shirt needing re-education on hemi technique and mod assist overall to complete.  Donned pants with max assist at sit<>stand level.  Pt agreeable to sit up in w/c at end of session, call bell in reach, seat belt alarm on.  ? ?Therapy Documentation ?Precautions:  ?Precautions ?Precautions: Fall ?Restrictions ?Weight Bearing Restrictions: No ? ? ? ?Therapy/Group: Individual Therapy ? ?Caryl Asp Donivan Thammavong ?04/27/2021, 8:53 AM ?

## 2021-04-27 NOTE — Progress Notes (Signed)
?                                                       PROGRESS NOTE ? ? ?Subjective/Complaints: ? ?No pain issues, chronic Left shoulder ROM restrictions prior to CVA due to L RCT ?Left eye enucleated d/t glaucoma  ?ROS: Patient denies CP, SOB, N/V/D ? ?Objective: ?  ?No results found. ?Recent Labs  ?  04/25/21 ?1224  ?WBC 7.4  ?HGB 12.0*  ?HCT 39.4  ?PLT 160  ? ? ?Recent Labs  ?  04/24/21 ?0826 04/25/21 ?1224  ?NA 134* 136  ?K 4.0 4.6  ?CL 101 102  ?CO2 23 18*  ?GLUCOSE 87 100*  ?BUN 40* 57*  ?CREATININE 7.85* 10.18*  ?CALCIUM 8.3* 8.9  ? ? ? ?Intake/Output Summary (Last 24 hours) at 04/27/2021 0751 ?Last data filed at 04/26/2021 2000 ?Gross per 24 hour  ?Intake 594 ml  ?Output --  ?Net 594 ml  ? ?  ? ?  ? ?Physical Exam: ?Vital Signs ?Blood pressure (!) 168/82, pulse (!) 57, temperature 98.6 ?F (37 ?C), temperature source Oral, resp. rate 18, height 6\' 3"  (1.905 m), weight 79 kg, SpO2 96 %. ? ? ? ?General: No acute distress ?Mood and affect are appropriate ?Heart: Regular rate and rhythm no rubs murmurs or extra sounds ?Lungs: Clear to auscultation, breathing unlabored, no rales or wheezes ?Abdomen: Positive bowel sounds, soft nontender to palpation, nondistended ?Extremities: No clubbing, cyanosis, or edema ?Skin: No evidence of breakdown, no evidence of rash, IV site ok  ? ?Musculoskeletal: Full range of motion in all 4 extremities. No joint swelling ? ?Neuro:  alert and oriented x 3. Left facial droop. Speech generally clear. LUE 1+/5 deltoid to 3-/5 bicep, tricep, wrist/hand. LLE 3+ to 4-/5. RUE and RLE 5/5. Senses pain and light touch in all 4's. No resting tone--stable exam  ?Musculoskeletal: Reduced active , PROM Left shoulder  ? ? ?Assessment/Plan: ?1. Functional deficits which require 3+ hours per day of interdisciplinary therapy in a comprehensive inpatient rehab setting. ?Physiatrist is providing close team supervision and 24 hour management of active medical problems listed below. ?Physiatrist and rehab  team continue to assess barriers to discharge/monitor patient progress toward functional and medical goals ? ?Care Tool: ? ?Bathing ?   ?Body parts bathed by patient: Left arm, Right upper leg, Left upper leg, Face  ? Body parts bathed by helper: Chest, Abdomen, Right arm, Front perineal area, Buttocks, Right lower leg, Left lower leg ?  ?  ?Bathing assist Assist Level: Total Assistance - Patient < 25% ?  ?  ?Upper Body Dressing/Undressing ?Upper body dressing Upper body dressing/undressing activity did not occur (including orthotics): Safety/medical concerns ?What is the patient wearing?: Hobbs only ?   ?Upper body assist Assist Level: Dependent - Patient 0% ?   ?Lower Body Dressing/Undressing ?Lower body dressing ? ? ? Lower body dressing activity did not occur: Safety/medical concerns ?What is the patient wearing?: Incontinence brief ? ?  ? ?Lower body assist Assist for lower body dressing: Dependent - Patient 0% ?   ? ?Toileting ?Toileting    ?Toileting assist Assist for toileting: Total Assistance - Patient < 25% ?  ?  ?Transfers ?Chair/bed transfer ? ?Transfers assist ? Chair/bed transfer activity did not occur: Safety/medical concerns ? ?Chair/bed transfer assist level: Moderate Assistance -  Patient 50 - 74% ?  ?  ?Locomotion ?Ambulation ? ? ?Ambulation assist ? ? Ambulation activity did not occur: Safety/medical concerns ? ?Assist level: Moderate Assistance - Patient 50 - 74% ?  ?Max distance: 30  ? ?Walk 10 feet activity ? ? ?Assist ?   ? ?Assist level: Moderate Assistance - Patient - 50 - 74% ?   ? ?Walk 50 feet activity ? ? ?Assist   ? ?Assist level: Moderate Assistance - Patient - 50 - 74% ?   ? ? ?Walk 150 feet activity ? ? ?Assist Walk 150 feet activity did not occur: Safety/medical concerns ? ?  ?  ?  ? ?Walk 10 feet on uneven surface  ?activity ? ? ?Assist Walk 10 feet on uneven surfaces activity did not occur: Safety/medical concerns ? ? ?  ?   ? ?Wheelchair ? ? ? ? ?Assist Is the patient  using a wheelchair?: Yes ?Type of Wheelchair: Manual ?  ? ?Wheelchair assist level: Dependent - Patient 0% ?Max wheelchair distance: 150  ? ? ?Wheelchair 50 feet with 2 turns activity ? ? ? ?Assist ? ?  ?  ? ? ?Assist Level: Dependent - Patient 0%  ? ?Wheelchair 150 feet activity  ? ? ? ?Assist ?   ? ? ?Assist Level: Dependent - Patient 0%  ? ?Blood pressure (!) 168/82, pulse (!) 57, temperature 98.6 ?F (37 ?C), temperature source Oral, resp. rate 18, height 6\' 3"  (1.905 m), weight 79 kg, SpO2 96 %. ? ?Medical Problem List and Plan: ?1. Functional deficits secondary to cerebral ischemic stroke, bilateral due to global hypoperfusion ?            -patient may shower ?            -ELOS/Goals: 10-14 days S ?            -Continue CIR therapies including PT, OT, and SLP   ?2.  Antithrombotics: ?-DVT/anticoagulation:  Pharmaceutical:  -resumed Eliquis ?            -antiplatelet therapy: Resume Plavix ?3. Pain Management: Tylenol prn.  ?4. Mood: LCSW to follow for evaluation and support.  ?            -antipsychotic agents: NA ?5. Neuropsych: This patient may be capable of making decisions on his own behalf. ?6. Skin/Wound Care:  Routine pressure relief measures.  ?7. Fluids/Electrolytes/Nutrition: Strict I/O --not on any fluid restriction at this time.  ?8. Hypertension: Monitor for orthostatic/syncopal episodes with increase in activity. ?            --continue metoprolol tid  ?  ?Vitals:  ? 04/26/21 1957 04/27/21 0333  ?BP: 135/78 (!) 168/82  ?Pulse: 65 (!) 57  ?Resp: 16 18  ?Temp:  98.6 ?F (37 ?C)  ?SpO2: 98% 96%  ? ? ?9. CAF: Monitor HR TID ?10. T2DM w/neuropathy and gastroparesis: Not on any meds as A1C normalized-4.7.  ?            --BS have been well controlled.--can dc cbg's and SSI  .   ?11. H/o GERD w/gastritis/esophagitis: ?78. ESRD: Continue HD TTS at the end of the day to help with tolerance of therapy. ?            -- on phoslo and calcitriol for metabolic bone disese. ?            --not in favor of  transitioning to PD. Plans for graft in LUE? ?13. Anemia of chronic disease: Stable  w/ Hgb @ 11.5 on 03/02. Repeat Hgb tomorrow.  ?14. Glaucoma:s/p Left orbital implant Resume cosopt eye drops.  ?13. Diarrhea: persistent, Only 1 BM last night  ?  ?+ C diff on Metronidazole  ?-added fiber for bulking.  ?-add cholestyramine also ?-on no scheduled softeners or laxatives ?  ? ?LOS: ?3 days ?A FACE TO FACE EVALUATION WAS PERFORMED ? ?Luanna Salk Yaris Ferrell ?04/27/2021, 7:51 AM  ? ?  ?

## 2021-04-27 NOTE — Progress Notes (Signed)
Occupational Therapy Session Note ? ?Patient Details  ?Name: [name]. ?MRN: [id number] ?Date of Birth: 05/13/1970 ? ?Today's Date: 04/27/2021 ?OT Individual Time: 7342-8768 ?OT Individual Time Calculation (min): 33 min  ? ? ?Short Term Goals: ?Week 1:  OT Short Term Goal 1 (Week 1): Pt will bathe UB with no A except to facilitate LUE To wash RUE + min cuing ?OT Short Term Goal 2 (Week 1): Pt will completes oral care wiht MOD HOH A ?OT Short Term Goal 3 (Week 1): Pt will recall hemi dressing techniques wiht MIN VC ?OT Short Term Goal 4 (Week 1): Pt will transfer to toilet wiht MIN A ? ?Skilled Therapeutic Interventions/Progress Updates:  ?Pt greeted supine in bed agreeable to OT intervention. Session focus on BADL reeducation, functional mobility, dynamic standing balance and decreasing overall caregiver burden. Pt completed supine>sit with MIN A needed to support LUE during bed mobility d/t painful shoulder. Pt completed ambulatory transfer to bathroom with Rw and MIN A. Pt with large liquid BM ( indicated in flowsheet) pt needed MAX A for 3/3 toileting tasks. Pt ambulated back to EOB with Rw and MIN A. Pt complete sit>supine with MIN A to elevate BLEs, pt able to assist with repositioning in bed. pt left supine in bed with bed alarm activated and all needs within reach.                      ? ? ?Therapy Documentation ?Precautions:  ?Precautions ?Precautions: Fall ?Restrictions ?Weight Bearing Restrictions: No ? ?Pain: unrated pain d/t L shoulder, rest breaks, increased support  and repositioning provided.  ? ? ?Therapy/Group: Individual Therapy ? ?Precious Haws ?04/27/2021, 3:28 PM ?

## 2021-04-27 NOTE — Plan of Care (Signed)
?  Problem: RH BOWEL ELIMINATION ?Goal: RH STG MANAGE BOWEL WITH ASSISTANCE ?Description: STG Manage Bowel with mod I  Assistance. ?Outcome: Not Progressing; Cdiff ?  ?

## 2021-04-27 NOTE — Progress Notes (Signed)
Patient ID: [name] Cooper., male   DOB: 22-Jul-1970, 51 y.o.   MRN: [id number] ? ?S: seen in room  ? ? ?O:BP (!) 159/82 (BP Location: Left Arm)   Pulse (!) 51   Temp 98.1 ?F (36.7 ?C)   Resp 16   Ht 6\' 3"  (1.905 m)   Wt 79 kg   SpO2 98%   BMI 21.77 kg/m?  ? ?Intake/Output Summary (Last 24 hours) at 04/27/2021 1617 ?Last data filed at 04/27/2021 1339 ?Gross per 24 hour  ?Intake 718 ml  ?Output --  ?Net 718 ml  ? ? ?Intake/Output: ?I/O last 3 completed shifts: ?In: 594 [P.O.:594] ?Out: -  ? Intake/Output this shift: ? Total I/O ?In: 96 [P.O.:358] ?Out: -  ?Weight change: -2.4 kg ?Gen:NAD ?CVS: RRR ?Resp:CTA ?Abd: +BS, soft, NT/ND ?Ext: no edema ?ACCESS: R IJ TDC, R nonfunctioning AVF ?Neuro: L eye closed, L sided weakness ? ?Recent Labs  ?Lab 04/21/21 ?1004 04/22/21 ?4696 04/23/21 ?2952 04/24/21 ?8413 04/25/21 ?1224  ?NA 138 136 137 134* 136  ?K 3.7 4.3 5.7* 4.0 4.6  ?CL 103 105 107 101 102  ?CO2 23 21* 16* 23 18*  ?GLUCOSE 91 70 91 87 100*  ?BUN 28* 43* 62* 40* 57*  ?CREATININE 6.63* 8.77* 11.23* 7.85* 10.18*  ?ALBUMIN 3.3*  --  3.2*  --  3.0*  ?CALCIUM 8.5* 8.5* 8.7* 8.3* 8.9  ?PHOS  --   --   --   --  7.8*  ?AST 14*  --  12*  --   --   ?ALT 11  --  11  --   --   ? ? ?Liver Function Tests: ?Recent Labs  ?Lab 04/21/21 ?1004 04/23/21 ?2440 04/25/21 ?1224  ?AST 14* 12*  --   ?ALT 11 11  --   ?ALKPHOS 80 81  --   ?BILITOT 0.7 0.6  --   ?PROT 6.9 6.6  --   ?ALBUMIN 3.3* 3.2* 3.0*  ? ? ?No results for input(s): LIPASE, AMYLASE in the last 168 hours. ?No results for input(s): AMMONIA in the last 168 hours. ?CBC: ?Recent Labs  ?Lab 04/21/21 ?1004 04/22/21 ?1027 04/23/21 ?2536 04/25/21 ?1224  ?WBC 6.0 9.3 9.6 7.4  ?NEUTROABS 3.8  --  6.2  --   ?HGB 11.4* 10.7* 11.5* 12.0*  ?HCT 37.5* 36.7* 40.9 39.4  ?MCV 79.1* 80.0 80.0 77.9*  ?PLT 156 156 171 160  ? ? ?Cardiac Enzymes: ?No results for input(s): CKTOTAL, CKMB, CKMBINDEX, TROPONINI in the last 168 hours. ?CBG: ?Recent Labs  ?Lab 04/25/21 ?6440 04/25/21 ?1205  04/25/21 ?1722 04/26/21 ?3474 04/26/21 ?2595  ?GLUCAP 88 108* 71 94 81  ? ? ? ?Iron Studies: No results for input(s): IRON, TIBC, TRANSFERRIN, FERRITIN in the last 72 hours. ?Studies/Results: ?No results found. ? amiodarone  200 mg Oral Daily  ? apixaban  5 mg Oral BID  ? artificial tears   Left Eye QID  ? atorvastatin  80 mg Oral Daily  ? calcitRIOL  0.5 mcg Oral Q T,Th,Sa-HD  ? calcium acetate  1,334 mg Oral TID WC  ? cholestyramine  4 g Oral BID  ? clopidogrel  75 mg Oral Daily  ? dorzolamide-timolol  1 drop Right Eye BID  ? hydrocortisone  25 mg Rectal BID  ? lactobacillus  1 g Oral TID WC  ? methocarbamol  500 mg Oral TID  ? metoprolol tartrate  25 mg Oral TID  ? multivitamin  1 tablet Oral QHS  ? multivitamin with minerals  1 tablet Oral Daily  ? polycarbophil  625 mg Oral BID  ? prednisoLONE acetate  1 drop Left Eye Q4H  ? vancomycin  125 mg Oral QID  ? ? ?BMET ?   ?Component Value Date/Time  ? NA 136 04/25/2021 1224  ? K 4.6 04/25/2021 1224  ? CL 102 04/25/2021 1224  ? CO2 18 (L) 04/25/2021 1224  ? GLUCOSE 100 (H) 04/25/2021 1224  ? BUN 57 (H) 04/25/2021 1224  ? CREATININE 10.18 (H) 04/25/2021 1224  ? CALCIUM 8.9 04/25/2021 1224  ? GFRNONAA 6 (L) 04/25/2021 1224  ? GFRAA 5 (L) 08/27/2019 1540  ? ?CBC ?   ?Component Value Date/Time  ? WBC 7.4 04/25/2021 1224  ? RBC 5.06 04/25/2021 1224  ? HGB 12.0 (L) 04/25/2021 1224  ? HCT 39.4 04/25/2021 1224  ? PLT 160 04/25/2021 1224  ? MCV 77.9 (L) 04/25/2021 1224  ? MCH 23.7 (L) 04/25/2021 1224  ? MCHC 30.5 04/25/2021 1224  ? RDW 18.8 (H) 04/25/2021 1224  ? LYMPHSABS 1.6 04/23/2021 0919  ? MONOABS 1.1 (H) 04/23/2021 0919  ? EOSABS 0.6 (H) 04/23/2021 0919  ? BASOSABS 0.1 04/23/2021 0919  ? ? ?OP HD: [facility] TTS ? 3.5h   400/800   87kg  2/2.5 bath  R AVF/ LIJ TDC  Heparin 1800 + 400u/hr ? - COVID neg 2/28, Hep B SAg neg 04/24/21 ? - Micera 150 mcg IVP every 2 weeks, Venofer 50 mg IV TTS ?- calcitriol 0.5 mcg TTS ?  ?Assessment/Plan: ? Acute bilateral watershed  stroke - MRA without obstruction of vessels.  Likely due to hemodynamic changes with HD as he and his wife report frequent drops of BP with HD, however after discussion with his home unit, he did not have any drop in BP during HD and had syncope after HD was completed.  Neuro following. Discussed possible need for transition to PD given risk of recurrence. ? ESRD -  plan for HD on TTS schedule  Cannot dose with midodrine before HD due to prolonged QT interval.  Discussed with he and his wife about possible transition to PD as above-  they have appropriate questions regarding that today.  No heparin with HD.  Will try to maintain BP in HD.  Next HD 04/28/21 ? Hypertension/volume  - stable ? Anemia  - stable-  hgb over 11-  no meds  ? Metabolic bone disease -  noncompliant with meds. Supposed to be on phoslo and calcitriol ? Nutrition - renal diet, carb modified. ?Disposition - in CIR ? ? ?Kelly Splinter, MD ?04/27/2021, 4:21 PM ? ? ? ? ? ?

## 2021-04-27 NOTE — IPOC Note (Signed)
Overall Plan of Care (IPOC) ?Patient Details ?Name: [name]. ?MRN: [id number] ?DOB: Sep 15, 1970 ? ?Admitting Diagnosis: Cerebral ischemic stroke due to global hypoperfusion with watershed infarct [facility]) ? ?Hospital Problems: Principal Problem: ?  Cerebral ischemic stroke due to global hypoperfusion with watershed infarct [facility]) ? ? ? ? Functional Problem List: ?Nursing Medication Management, Safety, Bowel, Pain, Endurance  ?PT Balance, Behavior, Endurance, Motor, Nutrition, Perception, Safety, Sensory  ?OT Balance, Cognition, Edema, Endurance, Motor, Nutrition, Pain, Perception, Safety  ?SLP Cognition  ?TR    ?    ? Basic ADL?s: ?OT Grooming, Bathing, Dressing, Toileting, Eating  ? ?  Advanced  ADL?s: ?OT    ?   ?Transfers: ?PT Bed Mobility, Car, Bed to Chair, Furniture  ?OT Toilet, Tub/Shower  ? ?  Locomotion: ?PT Ambulation, Wheelchair Mobility, Stairs  ? ?  Additional Impairments: ?OT Fuctional Use of Upper Extremity  ?SLP Social Cognition ?  ?Problem Solving, Memory, Attention  ?TR    ? ? ?Anticipated Outcomes ?Item Anticipated Outcome  ?Self Feeding S  ?Swallowing ?   ?  ?Basic self-care ? S UB, MIN LB  ?Toileting ? MIN ?  ?Bathroom Transfers S  ?Bowel/Bladder ? manage bowel w mod I assist  ?Transfers ? supervision assist  ?Locomotion ? CGA with LRAD  ?Communication ? Supervision  ?Cognition ?    ?Pain ? pain at or below level 4 with prns  ?Safety/Judgment ? maintain safety w cues  ? ?Therapy Plan: ?PT Intensity: Minimum of 1-2 x/day ,45 to 90 minutes ?PT Frequency: 5 out of 7 days ?PT Duration Estimated Length of Stay: 16-19 days ?OT Intensity: Minimum of 1-2 x/day, 45 to 90 minutes ?OT Frequency: 5 out of 7 days ?OT Duration/Estimated Length of Stay: 2.5-3 weeks ?SLP Intensity: Minumum of 1-2 x/day, 30 to 90 minutes ?SLP Frequency: 3 to 5 out of 7 days ?SLP Duration/Estimated Length of Stay: 3 weeks  ? ?Due to the current state of emergency, patients may not be receiving their 3-hours of  Medicare-mandated therapy. ? ? Team Interventions: ?Nursing Interventions Disease Management/Prevention, Medication Management, Discharge Planning, Pain Management, Bowel Management, Patient/Family Education  ?PT interventions Ambulation/gait training, Training and development officer, Cognitive remediation/compensation, Functional mobility training, Discharge planning, Community reintegration, DME/adaptive equipment instruction, Disease management/prevention, Functional electrical stimulation, Neuromuscular re-education, Pain management, Patient/family education, Psychosocial support, Stair training, Therapeutic Activities, Skin care/wound management, Therapeutic Exercise, Splinting/orthotics, Visual/perceptual remediation/compensation, UE/LE Coordination activities, UE/LE Strength taining/ROM, Wheelchair propulsion/positioning  ?OT Interventions Balance/vestibular training, Discharge planning, Functional electrical stimulation, Pain management, Self Care/advanced ADL retraining, Therapeutic Activities, UE/LE Coordination activities, Therapeutic Exercise, Skin care/wound managment, Patient/family education, Functional mobility training, Disease mangement/prevention, Cognitive remediation/compensation, Community reintegration, DME/adaptive equipment instruction, Neuromuscular re-education, Psychosocial support, Splinting/orthotics, UE/LE Strength taining/ROM, Wheelchair propulsion/positioning  ?SLP Interventions Cognitive remediation/compensation, Environmental controls, Internal/external aids, Therapeutic Activities, Patient/family education, Functional tasks  ?TR Interventions    ?SW/CM Interventions Discharge Planning, Psychosocial Support, Patient/Family Education, Disease Management/Prevention  ? ?Barriers to Discharge ?MD  Medical stability and Hemodialysis  ?Nursing Decreased caregiver support, Hemodialysis ?1 level ramped entry w spouse; has BSC, RW  ?PT Inaccessible home environment, Home environment  access/layout, Insurance for SNF coverage, Behavior ?   ?OT Decreased caregiver support, Lack of/limited family support, Hemodialysis ?   ?SLP  (none for ST) ?   ?SW Lack of/limited family support, Decreased caregiver support ?   ? ?Team Discharge Planning: ?Destination: PT-Home ,OT- Home , SLP-Home ?Projected Follow-up: PT-Home health PT, OT-  Home health OT, SLP-Other (comment) (intermittent supervision with complex cognitive tasks) ?  Projected Equipment Needs: PT-To be determined, Rolling walker with 5" wheels, OT- 3 in 1 bedside comode, To be determined, SLP-None recommended by SLP ?Equipment Details: PT- , OT-  ?Patient/family involved in discharge planning: PT- Patient, Family member/caregiver,  OT-Patient, Family member/caregiver, SLP-Patient, Family member/caregiver ? ?MD ELOS: 10-14d ?Medical Rehab Prognosis:  Good ?Assessment: The patient has been admitted for CIR therapies with the diagnosis of RIght parietal infarct. The team will be addressing functional mobility, strength, stamina, balance, safety, adaptive techniques and equipment, self-care, bowel and bladder mgt, patient and caregiver education, ESRD, T2DM,CHF. Goals have been set at supervision. Anticipated discharge destination is home. ? ?Due to the current state of emergency, patients may not be receiving their 3 hours per day of Medicare-mandated therapy.  ?  ?See Team Conference Notes for weekly updates to the plan of care  ?Charlett Blake M.D. ?[place] ?Fellow Am [place] ?Diplomate Am Board of Electrodiagnostic Med ?Fellow Am Board of Interventional Pain ? ?

## 2021-04-27 NOTE — Progress Notes (Signed)
Pt receives out-pt HD at [facility] on TTS. Pt has a 6:15 chair time. Spoke to [place] at clinic to confirm the above info. Will advise clinic of pt's d/c date once known.  ? ?Melven Sartorius ?Renal Navigator ?[phone] ?

## 2021-04-27 NOTE — Progress Notes (Signed)
Inpatient Rehabilitation Care Coordinator ?Assessment and Plan ?Patient Details  ?Name: [name]. ?MRN: [id number] ?Date of Birth: August 23, 1970 ? ?Today's Date: 04/27/2021 ? ?Hospital Problems: Principal Problem: ?  Cerebral ischemic stroke due to global hypoperfusion with watershed infarct [facility]) ? ?Past Medical History:  ?Past Medical History:  ?Diagnosis Date  ? Anxiety   ? Cellulitis   ? CHF (congestive heart failure) ([place])   ? Chronic diarrhea   ? Chronic kidney disease   ? Chronic pain of both ankles   ? Diabetes mellitus without complication ([place])   ? Diabetes mellitus, type II ([place])   ? Diabetic neuropathy ([place])   ? Esophagitis   ? Frequent falls   ? Gait instability   ? Gastritis   ? GERD (gastroesophageal reflux disease)   ? H/O enucleation of left eyeball   ? Heart palpitations   ? Hemodialysis patient [facility])   ? History of kidney stones   ? HLD (hyperlipidemia)   ? Hypertension   ? Insomnia   ? Nausea and vomiting in adult   ? recurrent  ? ?Past Surgical History:  ?Past Surgical History:  ?Procedure Laterality Date  ? AMPUTATION Left 03/16/2016  ? Procedure: AMPUTATION DIGIT LEFT HALLUX;  Surgeon: Trula Slade, DPM;  Location: [place];  Service: Podiatry;  Laterality: Left;  can start around 5   ? AMPUTATION Right 02/09/2021  ? Procedure: AMPUTATION DIGIT;  Surgeon: Sherilyn Cooter, MD;  Location: [place];  Service: Orthopedics;  Laterality: Right;  ? ARTHROSCOPIC REPAIR ACL Left   ? AV FISTULA PLACEMENT Right 05/31/2019  ? Procedure: Brachiocephalic AV fistula creation;  Surgeon: Algernon Huxley, MD;  Location: [name] ORS;  Service: Vascular;  Laterality: Right;  ? COLONOSCOPY WITH PROPOFOL N/A 10/28/2015  ? Procedure: COLONOSCOPY WITH PROPOFOL;  Surgeon: Lollie Sails, MD;  Location: [facility] ENDOSCOPY;  Service: Endoscopy;  Laterality: N/A;  ? COLONOSCOPY WITH PROPOFOL N/A 10/29/2015  ? Procedure: COLONOSCOPY WITH PROPOFOL;  Surgeon: Lollie Sails, MD;  Location: [facility] ENDOSCOPY;  Service: Endoscopy;   Laterality: N/A;  ? CORONARY/GRAFT ACUTE MI REVASCULARIZATION N/A 02/26/2020  ? Procedure: Coronary/Graft Acute MI Revascularization;  Surgeon: Jettie Booze, MD;  Location: [name] CV LAB;  Service: Cardiovascular;  Laterality: N/A;  ? DIALYSIS/PERMA CATHETER INSERTION Right 04/26/2019  ? Perm Cath   ? DIALYSIS/PERMA CATHETER INSERTION N/A 04/26/2019  ? Procedure: DIALYSIS/PERMA CATHETER INSERTION;  Surgeon: Algernon Huxley, MD;  Location: [place] CV LAB;  Service: Cardiovascular;  Laterality: N/A;  ? DIALYSIS/PERMA CATHETER REMOVAL N/A 08/15/2019  ? Procedure: DIALYSIS/PERMA CATHETER REMOVAL;  Surgeon: Katha Cabal, MD;  Location: [place] CV LAB;  Service: Cardiovascular;  Laterality: N/A;  ? EMBOLIZATION Right 02/06/2021  ? Procedure: EMBOLIZATION;  Surgeon: Algernon Huxley, MD;  Location: [place] CV LAB;  Service: Cardiovascular;  Laterality: Right;  Right Upper Extremity Dialysis Access, Permcath Placement  ? ESOPHAGOGASTRODUODENOSCOPY (EGD) WITH PROPOFOL N/A 12/27/2017  ? Procedure: ESOPHAGOGASTRODUODENOSCOPY (EGD) WITH PROPOFOL;  Surgeon: Toledo, Benay Pike, MD;  Location: [name] ENDOSCOPY;  Service: Gastroenterology;  Laterality: N/A;  ? INTRAVASCULAR ULTRASOUND/IVUS N/A 02/26/2020  ? Procedure: Intravascular Ultrasound/IVUS;  Surgeon: Jettie Booze, MD;  Location: [place] CV LAB;  Service: Cardiovascular;  Laterality: N/A;  ? LEFT HEART CATH AND CORONARY ANGIOGRAPHY N/A 02/26/2020  ? Procedure: LEFT HEART CATH AND CORONARY ANGIOGRAPHY;  Surgeon: Jettie Booze, MD;  Location: [place] CV LAB;  Service: Cardiovascular;  Laterality: N/A;  ? PROSTATE SURGERY  2016  ?  TONSILECTOMY/ADENOIDECTOMY WITH MYRINGOTOMY    ? TONSILLECTOMY    ? UPPER EXTREMITY ANGIOGRAPHY Right 11/26/2020  ? Procedure: Upper Extremity Angiography;  Surgeon: Algernon Huxley, MD;  Location: [place] CV LAB;  Service: Cardiovascular;  Laterality: Right;  ? UPPER EXTREMITY ANGIOGRAPHY Right 02/05/2021  ?  Procedure: UPPER EXTREMITY ANGIOGRAPHY;  Surgeon: Algernon Huxley, MD;  Location: [place] CV LAB;  Service: Cardiovascular;  Laterality: Right;  ? ?Social History:  reports that he has never smoked. He has never used smokeless tobacco. He reports that he does not drink alcohol and does not use drugs. ? ?Family / Support Systems ?Spouse/Significant Other: Petro Talent ?Other Supports: [employer] ?Anticipated Caregiver: Nael Petrosyan ?Ability/Limitations of Caregiver: n/a ?Caregiver Availability: 24/7 ?Family Dynamics: support from spoiuse and other family ? ?Social History ?Preferred language: English ?Religion: [place] - How often do you need to have someone help you when you read instructions, pamphlets, or other written material from your doctor or pharmacy?: Never ?Writes: Yes ?Employment Status: Retired ?Legal History/Current Legal Issues: n/a ?Guardian/Conservator: n/a  ? ?Abuse/Neglect ?Abuse/Neglect Assessment Can Be Completed: Yes ?Physical Abuse: Denies ?Verbal Abuse: Denies ?Sexual Abuse: Denies ?Exploitation of patient/patient's resources: Denies ?Self-Neglect: Denies ? ?Patient response to: ?Social Isolation - How often do you feel lonely or isolated from those around you?: Never ? ?Emotional Status ?Recent Psychosocial Issues: coping, hx of anxiety ?Psychiatric History: hx of anxiety, insomnia ?Substance Abuse History: n/a ? ?Patient / Family Perceptions, Expectations & Goals ?Pt/Family understanding of illness & functional limitations: yes ?Premorbid pt/family roles/activities: patient independent with cane within the community ?Anticipated changes in roles/activities/participation: spouse able to assist with roles and tasks ?Pt/family expectations/goals: Supervision to MOD I ? ?Community Resources ?Community Agencies: None ?Premorbid Home Care/DME Agencies: Other (Comment) (Cane, RW and [facility]) ?Transportation available at discharge: spouse able to transport ?Is the  patient able to respond to transportation needs?: Yes ?In the past 12 months, has lack of transportation kept you from medical appointments or from getting medications?: No ?In the past 12 months, has lack of transportation kept you from meetings, work, or from getting things needed for daily living?: No ?Resource referrals recommended: Neuropsychology ? ?Discharge Planning ?Living Arrangements: Spouse/significant other ?Support Systems: Spouse/significant other ?Type of Residence: Private residence ?Insurance Resources: Multimedia programmer (specify) Personnel officer) ?Financial Resources: Social Security ?Money Management: Patient, Spouse ?Does the patient have any problems obtaining your medications?: No ?Home Management: independent ?Patient/Family Preliminary Plans: anticipating MODI goals, spouse able to have patient set up ?Care Coordinator Barriers to Discharge: Lack of/limited family support, Decreased caregiver support ?Care Coordinator Anticipated Follow Up Needs: HH/OP ?DC Planning Additional Notes/Comments: HD TRS, Ramped entrance ?Expected length of stay: 12-15 Days ? ?Clinical Impression ?Sw spoke with patient spouse, introduced self and explained role. Made attempt see patient, pt asleep. Patient discharging home with family to assist some. Anticipating MOD  I goals. Sw will continue to follow up. ? ?Dyanne Iha ?04/27/2021, 12:37 PM ? ?  ?

## 2021-04-27 NOTE — Progress Notes (Signed)
Physical Therapy Session Note ? ?Patient Details  ?Name: [name]. ?MRN: [id number] ?Date of Birth: 1971-01-15 ? ?Today's Date: 04/27/2021 ?PT Individual Time: 8921-1941 ?PT Individual Time Calculation (min): 36 min  ? ?Short Term Goals: ?Week 1:  PT Short Term Goal 1 (Week 1): pt will perform bed mobility with min assist ?PT Short Term Goal 2 (Week 1): Pt will ambulate 52ft with min assist ?PT Short Term Goal 3 (Week 1): Pt will trasnfer to [facility] with min assist and LRAd ?PT Short Term Goal 4 (Week 1): Pt will perform TUG in < 1 minute ? ?Skilled Therapeutic Interventions/Progress Updates:  ?On initial entrance to room, NT relates that pt is on bedpan and has requested privacy. Plus she relates that he takes a long time to move bowels despite c-diff diagnosis.  ? ?On return visit, when toileting is completed,  ? ?PPE for enteric precautions donned and pt supine in bed on entrance to room. Patient alert and agreeable to PT session. RN in room to provide medications and treatments as MD has changed pt's medication to combat c. Diff diagnosis. Also treats pt's hemorrhoids and provides suppository. This is followed by arrival of nephrologist to talk to pt re: this week's dialysis treatments and get update from pt on status. Discussion with pt re: bedpan use for BM, but pt states that sometimes he has difficulty moving bowels (despite c.diff) while sitting upright at toilet, so he doesn't mind using bedpan although he would like to not have to use it. BSC placed over toilet for increased height in sitting for reduced pain from hemorrhoids ? ?Patient with no pain complaint throughout session. ? ?Therapeutic Activities: ?Bed Mobility:  Patient performed supine --> sit with MinA and cues required for reaching sidelying. VC/ tc required for log roll technique. Has trouble bringing L UE over body for true sidelying and MinA for push up from bed surface. Extra time to complete. ?Transfers: Patient performed sit<>stand and  stand pivot transfers throughout session with CGA/ MinA.Marland Kitchen Provided verbal cues for increasing forward lean and improving push from seated surface. ? ?Neuromuscular Re-ed: ?NMR facilitated during session with focus on standing balance and muscle activation. Pt guided in sit<>stand training with NDT techniques to improve forward lean/ anterior weight shift for improved independence in performance. Focus on eccentric lowering to improve muscle activation and strength. Pt also guided in minisquat performance with 15 reps using RW and then 15 reps with no UE support. NMR performed for improvements in motor control and coordination, balance, sequencing, judgement, and self confidence/ efficacy in performing all aspects of mobility at highest level of independence.  ? ? ?Patient supine  in bed at end of session with brakes locked, bed alarm set, and all needs within reach. ? ? ?Therapy Documentation ?Precautions:  ?Precautions ?Precautions: Fall ?Restrictions ?Weight Bearing Restrictions: No ?General: ?PT Amount of Missed Time (min): 24 Minutes ?PT Missed Treatment Reason: Toileting;Nursing care;Other (Comment) (Visit from kidney doctor) ?Vital Signs: ? ?Pain: ? Pt with mild pain/ discomfort from hemorrhoids. ? ?Therapy/Group: Individual Therapy ? ?Alger Simons PT, DPT ?04/27/2021, 12:46 PM  ?

## 2021-04-27 NOTE — Progress Notes (Signed)
Pt yelling that he is in pain on first meeting of pt. States "what took you so long? I called three times" I apologized and told pt that I had just arrived. Pt complaining of rectal and anal pain from frequent bowel movements. Pt does not want anusol suppository as it makes him feel like stooling. Called provider on call Marlowe Sh[place] PA. Orders received. Received message from pharmacy that preparation H is not available and that the substitute is also a suppository. Pt declined it also. Cleaned pt well and applied protective ointment. ?

## 2021-04-27 NOTE — Progress Notes (Signed)
Inpatient Rehabilitation  Patient information reviewed and entered into eRehab system by Kaytlan Behrman M. Morey Andonian, M.A., CCC/SLP, PPS Coordinator.  Information including medical coding, functional ability and quality indicators will be reviewed and updated through discharge.    

## 2021-04-27 NOTE — Progress Notes (Signed)
Physical Therapy Session Note ? ?Patient Details  ?Name: [name]. ?MRN: [id number] ?Date of Birth: May 30, 1970 ? ?Today's Date: 04/27/2021 ?PT Individual Time: 1415-1450 ?PT Individual Time Calculation (min): 35 min  ? ?Short Term Goals: ?Week 1:  PT Short Term Goal 1 (Week 1): pt will perform bed mobility with min assist ?PT Short Term Goal 2 (Week 1): Pt will ambulate 54ft with min assist ?PT Short Term Goal 3 (Week 1): Pt will trasnfer to [facility] with min assist and LRAd ?PT Short Term Goal 4 (Week 1): Pt will perform TUG in < 1 minute ? ?Skilled Therapeutic Interventions/Progress Updates:  ?  Pt received supine in bed, agreeable to therapy session. Obtained 20x18 w/c for patient due to report from patient that current chair of 18x18 too narrow. Also obtained regular RW as pt reports bariatric RW incorrect size for him. Once equipment obtained pt agreeable to therapy session. Pt reports some pain from his hemorrhoids, no other complaints of pain during session. Pt expresses some frustration with short scheduled therapy sessions this date and wishes to leave his room to go to therapy gym for session. Due to time constraints and time needed to obtain equipment educated pt that no time to go to therapy gym this session but can work on exercises in his room, pt agreeable. Supine to sit with min A for some trunk elevation. Sit to stand with min A to RW during session. Stand pivot transfer bed to w/c with RW and min A to trial w/c fit, w/c does appear wide for patient but he reports improved comfort. Stand pivot transfer back to bed with RW and min A. Sit to stand x 5 reps with no UE support and mod A with focus on anterior weight shift. Standing mini-squats x 10 reps with RW and min A for balance. Pt requests to return to bed at end of session. Sit to supine mod A needed for BLE management. Pt left seated in bed with needs in reach, bed alarm in place at end of session. ? ?Therapy Documentation ?Precautions:   ?Precautions ?Precautions: Fall ?Restrictions ?Weight Bearing Restrictions: No ? ? ? ? ? ?Therapy/Group: Individual Therapy ? ? ?Excell Seltzer, PT, DPT, CSRS ? ?04/27/2021, 5:21 PM  ?

## 2021-04-27 NOTE — Progress Notes (Signed)
Inpatient Rehabilitation Center ?Individual Statement of Services ? ?Patient Name:  [name].  ?Date:  04/27/2021 ? ?Welcome to the [place].  Our goal is to provide you with an individualized program based on your diagnosis and situation, designed to meet your specific needs.  With this comprehensive rehabilitation program, you will be expected to participate in at least 3 hours of rehabilitation therapies Monday-Friday, with modified therapy programming on the weekends. ? ?Your rehabilitation program will include the following services:  Physical Therapy (PT), Occupational Therapy (OT), Speech Therapy (ST), 24 hour per day rehabilitation nursing, Therapeutic Recreaction (TR), Neuropsychology, Care Coordinator, Rehabilitation Medicine, Nutrition Services, and Pharmacy Services ? ?Weekly team conferences will be held on Wednesdays to discuss your progress.  Your Inpatient Rehabilitation Care Coordinator will talk with you frequently to get your input and to update you on team discussions.  Team conferences with you and your family in attendance may also be held. ? ?Expected length of stay:  12-15 Days  Overall anticipated outcome:  Supervision to MOD I ? ?Depending on your progress and recovery, your program may change. Your Inpatient Rehabilitation Care Coordinator will coordinate services and will keep you informed of any changes. Your Inpatient Rehabilitation Care Coordinator's name and contact numbers are listed  below. ? ?The following services may also be recommended but are not provided by the [place]:  ? ?Home Health Rehabiltiation Services ?Outpatient Rehabilitation Services ? ?  ?Arrangements will be made to provide these services after discharge if needed.  Arrangements include referral to agencies that provide these services. ? ?Your insurance has been verified to be:   [employer] ?Your primary doctor is:  [name], MD ? ?Pertinent  information will be shared with your doctor and your insurance company. ? ?Inpatient Rehabilitation Care Coordinator:  Erlene Quan, [place] or ([phone] ? ?Information discussed with and copy given to patient by: Dyanne Iha, 04/27/2021, 11:57 AM    ?

## 2021-04-28 ENCOUNTER — Encounter (HOSPITAL_COMMUNITY): Payer: Self-pay | Admitting: Physical Medicine & Rehabilitation

## 2021-04-28 ENCOUNTER — Other Ambulatory Visit (HOSPITAL_COMMUNITY): Payer: Self-pay

## 2021-04-28 LAB — CBC
HCT: 41 % (ref 39.0–52.0)
Hemoglobin: 12.3 g/dL — ABNORMAL LOW (ref 13.0–17.0)
MCH: 23.4 pg — ABNORMAL LOW (ref 26.0–34.0)
MCHC: 30 g/dL (ref 30.0–36.0)
MCV: 77.9 fL — ABNORMAL LOW (ref 80.0–100.0)
Platelets: 223 10*3/uL (ref 150–400)
RBC: 5.26 MIL/uL (ref 4.22–5.81)
RDW: 18.6 % — ABNORMAL HIGH (ref 11.5–15.5)
WBC: 9.8 10*3/uL (ref 4.0–10.5)
nRBC: 0 % (ref 0.0–0.2)

## 2021-04-28 LAB — RENAL FUNCTION PANEL
Albumin: 2.9 g/dL — ABNORMAL LOW (ref 3.5–5.0)
Anion gap: 17 — ABNORMAL HIGH (ref 5–15)
BUN: 75 mg/dL — ABNORMAL HIGH (ref 6–20)
CO2: 14 mmol/L — ABNORMAL LOW (ref 22–32)
Calcium: 8.7 mg/dL — ABNORMAL LOW (ref 8.9–10.3)
Chloride: 103 mmol/L (ref 98–111)
Creatinine, Ser: 12.99 mg/dL — ABNORMAL HIGH (ref 0.61–1.24)
GFR, Estimated: 4 mL/min — ABNORMAL LOW (ref >60)
Glucose, Bld: 79 mg/dL (ref 70–99)
Phosphorus: 8.6 mg/dL — ABNORMAL HIGH (ref 2.5–4.6)
Potassium: 4.4 mmol/L (ref 3.5–5.1)
Sodium: 134 mmol/L — ABNORMAL LOW (ref 135–145)

## 2021-04-28 MED ORDER — CHOLESTYRAMINE 4 G PO PACK
4.0000 g | PACK | Freq: Three times a day (TID) | ORAL | Status: DC
  Administered 2021-04-28 – 2021-05-02 (×13): 4 g via ORAL
  Filled 2021-04-28 (×17): qty 1

## 2021-04-28 MED ORDER — HYDROCORTISONE 1 % EX CREA
TOPICAL_CREAM | Freq: Two times a day (BID) | CUTANEOUS | Status: DC
Start: 2021-04-28 — End: 2021-05-04
  Filled 2021-04-28: qty 28

## 2021-04-28 MED ORDER — OXYCODONE HCL 5 MG PO TABS
5.0000 mg | ORAL_TABLET | Freq: Four times a day (QID) | ORAL | Status: DC | PRN
  Administered 2021-04-29: 5 mg via ORAL
  Filled 2021-04-28: qty 1

## 2021-04-28 MED ORDER — LORAZEPAM 2 MG/ML IJ SOLN: 0.5000 mg | Freq: Four times a day (QID) | INTRAMUSCULAR | Status: DC | PRN

## 2021-04-28 NOTE — Progress Notes (Signed)
?                                                       PROGRESS NOTE ? ? ?Subjective/Complaints: ? ?Diarrhea , related to C diff, hx of lactose intolerance as well, pt avoiding eating due to diarrhea ?Pt concerned that Dry weight is 78 and he may be losing too much fluid  ?ROS: Patient denies CP, SOB, N/V/D ? ?Objective: ?  ?No results found. ?Recent Labs  ?  04/25/21 ?1224 04/28/21 ?0635  ?WBC 7.4 9.8  ?HGB 12.0* 12.3*  ?HCT 39.4 41.0  ?PLT 160 223  ? ? ?Recent Labs  ?  04/25/21 ?1224 04/28/21 ?0635  ?NA 136 134*  ?K 4.6 4.4  ?CL 102 103  ?CO2 18* 14*  ?GLUCOSE 100* 79  ?BUN 57* 75*  ?CREATININE 10.18* 12.99*  ?CALCIUM 8.9 8.7*  ? ? ? ?Intake/Output Summary (Last 24 hours) at 04/28/2021 0813 ?Last data filed at 04/27/2021 1911 ?Gross per 24 hour  ?Intake 594 ml  ?Output --  ?Net 594 ml  ? ?  ? ?  ? ?Physical Exam: ?Vital Signs ?Blood pressure 120/71, pulse (!) 59, temperature 98.6 ?F (37 ?C), temperature source Oral, resp. rate 16, height 6\' 3"  (1.905 m), weight 77.6 kg, SpO2 97 %. ? ? ? ?General: No acute distress ?Mood and affect are appropriate ?Heart: Regular rate and rhythm no rubs murmurs or extra sounds ?Lungs: Clear to auscultation, breathing unlabored, no rales or wheezes ?Abdomen: Positive bowel sounds, soft nontender to palpation, nondistended ?Extremities: No clubbing, cyanosis, or edema ?Skin: No evidence of breakdown, no evidence of rash, IV site ok  ? ?Musculoskeletal: Full range of motion in all 4 extremities. No joint swelling ? ?Neuro:  alert and oriented x 3. Left facial droop. Speech generally clear. LUE 1+/5 deltoid to 3-/5 bicep, tricep, wrist/hand. LLE 3+ to 4-/5. RUE and RLE 5/5. Senses pain and light touch in all 4's. No resting tone--stable exam  ?Musculoskeletal: Reduced active , PROM Left shoulder  ? ? ?Assessment/Plan: ?1. Functional deficits which require 3+ hours per day of interdisciplinary therapy in a comprehensive inpatient rehab setting. ?Physiatrist is providing close team  supervision and 24 hour management of active medical problems listed below. ?Physiatrist and rehab team continue to assess barriers to discharge/monitor patient progress toward functional and medical goals ? ?Care Tool: ? ?Bathing ?   ?Body parts bathed by patient: Left arm, Right upper leg, Left upper leg, Face  ? Body parts bathed by helper: Chest, Abdomen, Right arm, Front perineal area, Buttocks, Right lower leg, Left lower leg ?  ?  ?Bathing assist Assist Level: Total Assistance - Patient < 25% ?  ?  ?Upper Body Dressing/Undressing ?Upper body dressing Upper body dressing/undressing activity did not occur (including orthotics): Safety/medical concerns ?What is the patient wearing?: [place] only ?   ?Upper body assist Assist Level: Dependent - Patient 0% ?   ?Lower Body Dressing/Undressing ?Lower body dressing ? ? ? Lower body dressing activity did not occur: Safety/medical concerns ?What is the patient wearing?: Incontinence brief ? ?  ? ?Lower body assist Assist for lower body dressing: Dependent - Patient 0% ?   ? ?Toileting ?Toileting    ?Toileting assist Assist for toileting: Maximal Assistance - Patient 25 - 49% ?  ?  ?Transfers ?Chair/bed transfer ? ?  Transfers assist ? Chair/bed transfer activity did not occur: Safety/medical concerns ? ?Chair/bed transfer assist level: Minimal Assistance - Patient > 75% ?  ?  ?Locomotion ?Ambulation ? ? ?Ambulation assist ? ?   ? ?Assist level: Moderate Assistance - Patient 50 - 74% ?  ?Max distance: 30  ? ?Walk 10 feet activity ? ? ?Assist ?   ? ?Assist level: Moderate Assistance - Patient - 50 - 74% ?   ? ?Walk 50 feet activity ? ? ?Assist   ? ?Assist level: Moderate Assistance - Patient - 50 - 74% ?   ? ? ?Walk 150 feet activity ? ? ?Assist Walk 150 feet activity did not occur: Safety/medical concerns ? ?  ?  ?  ? ?Walk 10 feet on uneven surface  ?activity ? ? ?Assist Walk 10 feet on uneven surfaces activity did not occur: Safety/medical concerns ? ? ?  ?    ? ?Wheelchair ? ? ? ? ?Assist Is the patient using a wheelchair?: Yes ?Type of Wheelchair: Manual ?  ? ?Wheelchair assist level: Dependent - Patient 0% ?Max wheelchair distance: 150  ? ? ?Wheelchair 50 feet with 2 turns activity ? ? ? ?Assist ? ?  ?  ? ? ?Assist Level: Dependent - Patient 0%  ? ?Wheelchair 150 feet activity  ? ? ? ?Assist ?   ? ? ?Assist Level: Dependent - Patient 0%  ? ?Blood pressure 120/71, pulse (!) 59, temperature 98.6 ?F (37 ?C), temperature source Oral, resp. rate 16, height 6\' 3"  (1.905 m), weight 77.6 kg, SpO2 97 %. ? ?Medical Problem List and Plan: ?1. Functional deficits secondary to cerebral ischemic stroke, bilateral due to global hypoperfusion ?            -patient may shower ?            -ELOS/Goals: 10-14 days S ?            -Continue CIR therapies including PT, OT, and SLP   ?2.  Antithrombotics: ?-DVT/anticoagulation:  Pharmaceutical:  -resumed Eliquis ?            -antiplatelet therapy: Resume Plavix ?3. Pain Management: Tylenol prn.  ?4. Mood: LCSW to follow for evaluation and support.  ?            -antipsychotic agents: NA ?5. Neuropsych: This patient may be capable of making decisions on his own behalf. ?6. Skin/Wound Care:  Routine pressure relief measures.  ?7. Fluids/Electrolytes/Nutrition: Strict I/O --not on any fluid restriction at this time.  ?8. Hypertension: Monitor for orthostatic/syncopal episodes with increase in activity. ?            --continue metoprolol tid  ?  ?Vitals:  ? 04/27/21 2043 04/28/21 0428  ?BP:  120/71  ?Pulse: 68 (!) 59  ?Resp:  16  ?Temp:  98.6 ?F (37 ?C)  ?SpO2:  97%  ?Controlled 3/7 ? ?9. CAF: Monitor HR TID ?10. T2DM w/neuropathy and gastroparesis: Not on any meds as A1C normalized-4.7.  ?            --BS have been well controlled.--can dc cbg's and SSI  .   ?11. H/o GERD w/gastritis/esophagitis: ?8. ESRD: Continue HD TTS at the end of the day to help with tolerance of therapy. ?            -- on phoslo and calcitriol for metabolic bone  disese. ?            --not in favor of transitioning to  PD. Plans for graft in LUE? ?Messaged Nephro, regarding diarrhea, fluid loss, potential for Hypo K+  ?13. Anemia of chronic disease: Stable w/ Hgb @ 11.5 on 03/02. Repeat Hgb tomorrow.  ?14. Glaucoma:s/p Left orbital implant Resume cosopt eye drops.  ?13. Diarrhea: persistent, Only 1 BM last night  ?  ?+ C diff on Vanc  ?-added fiber for bulking.  ?-Increase cholestyramine to TID  ?-on no scheduled softeners or laxatives ?  ? ?LOS: ?4 days ?A FACE TO FACE EVALUATION WAS PERFORMED ? ?[name] ?04/28/2021, 8:13 AM  ? ?  ?

## 2021-04-28 NOTE — Plan of Care (Signed)
?  Problem: RH BOWEL ELIMINATION ?Goal: RH STG MANAGE BOWEL WITH ASSISTANCE ?Description: STG Manage Bowel with mod I  Assistance. ?Outcome: Not Progressing; Cdiff had 6x BM watery large amount per report last night; MD made aware; new orders noted ?  ?Problem: RH PAIN MANAGEMENT ?Goal: RH STG PAIN MANAGED AT OR BELOW [name] PAIN GOAL ?Description: Pain controled at or below level 4 with prns ?Outcome: Not Progressing; c/o pain on his rectum; MD made aware new orders noted ?  ?

## 2021-04-28 NOTE — Progress Notes (Signed)
Physical Therapy Session Note ? ?Patient Details  ?Name: [name]. ?MRN: [id number] ?Date of Birth: 24-May-1970 ? ?Today's Date: 04/28/2021 ?PT Individual Time: 9937-1696 ?PT Individual Time Calculation (min): 26 min  ? ?Short Term Goals: ?Week 1:  PT Short Term Goal 1 (Week 1): pt will perform bed mobility with min assist ?PT Short Term Goal 2 (Week 1): Pt will ambulate 46ft with min assist ?PT Short Term Goal 3 (Week 1): Pt will trasnfer to [facility] with min assist and LRAd ?PT Short Term Goal 4 (Week 1): Pt will perform TUG in < 1 minute ? ?Skilled Therapeutic Interventions/Progress Updates:  ?   ?Pt supine in bed to start session. MD in room for morning rounding. Pt agreeable to PT tx. Reports 8/10 bottom pain, related to his ongoing diarrhea. Rest breaks and repositioning provided for pain management. ? ?Supine<>sit with modA for trunk support. Able to sit unsupported at EOB with SBA. Completed stand<>pivot transfer with no AD and minA from EOB to w/c with cueing needed for sequencing and hand placement. ? ?Transported in w/c to main rehab gym for time. Instructed in gait training using RW, ambulating 2x58ft (seated rest) with minA and RW. Assist primarily needed for guiding and steering the RW. Gait deficits include B flexed knee's in stance with crouched posture, decreased B step length, veering L, and gait speed reduced. Gait distance limited by fatigue and pt c/o slight dizziness. Discussed diarrhea, dialysis, and other contributing factors that could lead to these sx. Dizziness resolved with seated rest breaks.  ? ?Transported back to his room in w/c for time. Remained seated in w/c with safety belt alarm on, call bell in reach.  ? ?Therapy Documentation ?Precautions:  ?Precautions ?Precautions: Fall ?Restrictions ?Weight Bearing Restrictions: No ?General: ?  ? ? ?Therapy/Group: Individual Therapy ? ?Yazleen Molock P Lexxi Koslow ?04/28/2021, 8:31 AM  ?

## 2021-04-28 NOTE — Progress Notes (Signed)
Physical Therapy Session Note ? ?Patient Details  ?Name: [name]. ?MRN: [id number] ?Date of Birth: 09-28-1970 ? ?Today's Date: 04/28/2021 ?PT Individual Time: 9563-8756 ?PT Individual Time Calculation (min): 54 min  ? ?Short Term Goals: ?Week 1:  PT Short Term Goal 1 (Week 1): pt will perform bed mobility with min assist ?PT Short Term Goal 2 (Week 1): Pt will ambulate 42ft with min assist ?PT Short Term Goal 3 (Week 1): Pt will trasnfer to [facility] with min assist and LRAd ?PT Short Term Goal 4 (Week 1): Pt will perform TUG in < 1 minute ? ?Skilled Therapeutic Interventions/Progress Updates:  ?Patient supine in bed on entrance to room. Patient alert and agreeable to PT session but slow to initiate.  ? ?Patient with no pain complaint throughout session. ? ?Therapeutic Activity: ?Bed Mobility: Patient performed supine <> sit with CGA/ supervision. VC/ tc required for technique and effort. ?Transfers: Patient performed sit<>stand and stand pivot transfers throughout session with light MinA initially and requiring elevation of bed surface. Improves to CGA during session. Provided verbal cues for increasing forward lean and push from seat for assist. ? ?Gait Training:  ?Patient ambulated 175 ft using RW with CGA/ minimal intermittent MinA for balance. Demonstrated slow pace with focused, conscious stepping. Provided vc/ tc for energy conservation and self assessment of fatigue/ balance. ? ?Neuromuscular Re-ed: ?NMR facilitated during session with focus on standing balance. Pt guided in static test of 5xSTS with pt completing in 41min 21sec.  (A score of 15 seconds or greater indicates patient is at an increased risk for falls. Education provided to patient on interpretation of balance score)  Pt guided in slow repetition of seated toe touch to cone placed ant to LLE. Needs repetition and extra time prior to movement for focus on task in order to allow brain to form motor plan.  Next pt guided in dynamic balance  challenge in gait using RW and touching of each foot to successive cone placed lateral to each step. Pt instructed to gently touch with foot, then gently remove. [name] coordination with LLE is more poor than RLE. Requires time to form plan for decrease in extra movements, lift from cone and less time req'd to "zero-in".  ? ?NMR performed for improvements in motor control and coordination, balance, sequencing, judgement, and self confidence/ efficacy in performing all aspects of mobility at highest level of independence.  ? ?Pt unhappy with meal he received for lunch and relates he will not eat. Transitions to supine. NT notified re: pt's feelings re: meal provided and possible need for double portions if pt can get food he wants. Later informed that Nephrology MD has ok'd regular diet with pt to choose meal. RN to request increased portion size from PMR MD.  ? ?Patient supine  in bed at end of session with brakes locked, bed alarm set, and all needs within reach. ?   ? ?Therapy Documentation ?Precautions:  ?Precautions ?Precautions: Fall ?Restrictions ?Weight Bearing Restrictions: No ?General: ?  ?Vital Signs: ?Therapy Vitals ?Temp: 98.2 ?F (36.8 ?C) ?Temp Source: Oral ?Resp: 16 ?BP: 130/74 ?Patient Position (if appropriate): Lying ?Oxygen Therapy ?SpO2: 98 % ?O2 Device: Room Air ?Pain: ?Pain Assessment ?Pain Score: 0-No pain ? ?Therapy/Group: Individual Therapy ? ?Alger Simons PT, DPT ?04/28/2021, 5:57 PM  ?

## 2021-04-28 NOTE — Progress Notes (Signed)
Occupational Therapy Session Note ? ?Patient Details  ?Name: [name]. ?MRN: [id number] ?Date of Birth: 1971-01-10 ? ?Today's Date: 04/28/2021 ?OT Individual Time: 9390-3009 ?OT Individual Time Calculation (min): 67 min  ? ? ?Short Term Goals: ?Week 1:  OT Short Term Goal 1 (Week 1): Pt will bathe UB with no A except to facilitate LUE To wash RUE + min cuing ?OT Short Term Goal 2 (Week 1): Pt will completes oral care wiht MOD HOH A ?OT Short Term Goal 3 (Week 1): Pt will recall hemi dressing techniques wiht MIN VC ?OT Short Term Goal 4 (Week 1): Pt will transfer to toilet wiht MIN A ?Week 2:    ? ?Skilled Therapeutic Interventions/Progress Updates:  ?  Patient supine in bed upon arrival, patient indicated he his night was restless and he was a little tired. Patient agreed on complete BADL in bathing at sink LOF. The pt completed function transfer from supine in bed to EOB with ModA for task initiation.  The pt was able to come from EOB to w/c LOF using a squat pivot transfer with Min-ModA for balance.  The pt was instructed to incorporate both LE and his RUE for moving his w/c to the sink area.  The pt complete bathing task at sink level for washing his face, arms and chest with heavy vc's and MinA.  The pt required ModA for washing the right side of his body. He was able to was his perineal area and bottom with MinA for his bottom for cleanliness. The pt was able to brush his teeth with s/u  and donn his hospital gown with [place].  The pt was MaxA for donning his brief.  The pt returned to the bed with MinA using his RW for the completion of a stand pivot transfer, he required [place] for maneuvering his feet back onto the bed.  The pt was able to assist with moving up in the bed with ModA while lying in supine with his bedside table within reach, his call light available and the bed alarm activated.  The pt had no c/o of pain this treatment session with all additional needs addressed.  ? ?Therapy  Documentation ?Precautions:  ?Precautions ?Precautions: Fall ?Restrictions ?Weight Bearing Restrictions: No ?General: ?  ?Vital Signs: ? ?Pain: ?Pain Assessment ?Pain Scale: 0-10 ?Pain Score: 0-No pain ? ?Therapy/Group: Individual Therapy ? ?Yvonne Kendall ?04/28/2021, 12:50 PM ? ?

## 2021-04-28 NOTE — Progress Notes (Signed)
Hemodialysis- Tolerated well without issue. Given 1L fluid per Dr. Jonnie Finner. Currently without complaints. Reported off to [employer]. ?

## 2021-04-28 NOTE — Progress Notes (Signed)
Patient ID: [name]., male   DOB: 16-Dec-1970, 51 y.o.   MRN: [id number] ? ?S: seen in room , wants better food. Having diarrhea every few hrs.  ? ? ?O:BP 117/70   Pulse (!) 59   Temp 98.2 ?F (36.8 ?C) (Oral)   Resp 18   Ht 6\' 3"  (1.905 m)   Wt 78.3 kg   SpO2 98%   BMI 21.58 kg/m?  ? ?Intake/Output Summary (Last 24 hours) at 04/28/2021 1555 ?Last data filed at 04/28/2021 1258 ?Gross per 24 hour  ?Intake 476 ml  ?Output --  ?Net 476 ml  ? ? ?Intake/Output: ?I/O last 3 completed shifts: ?In: 714 [P.O.:714] ?Out: -  ? Intake/Output this shift: ? Total I/O ?In: 240 [P.O.:240] ?Out: -  ?Weight change: -1.4 kg ?Gen:NAD ?CVS: RRR ?Resp:CTA ?Abd: +BS, soft, NT/ND ?Ext: no edema ?ACCESS: R IJ TDC, R nonfunctioning AVF ?Neuro: L eye closed, L sided weakness ? ?OP HD: [facility] ? 3.5h   400/800   87kg  2/2.5 bath  R AVF/ LIJ TDC  Heparin 1800 + 400u/hr ? - COVID neg 2/28, Hep B SAg neg 04/24/21 ? - Micera 150 mcg IVP every 2 weeks, Venofer 50 mg IV TTS ?- calcitriol 0.5 mcg TTS ?  ?Assessment/Plan: ? Acute bilateral watershed stroke - MRA without obstruction of vessels.  Likely due to hemodynamic changes with HD as he and his wife report frequent drops of BP with HD, however after discussion with his home unit, he did not have any drop in BP during HD and had syncope after HD was completed.  Neuro following.  ? Cdif diarrhea - getting po vanc qid. Per pmd.  ?ESRD -  HD TTS schedule  Cannot dose with midodrine before HD due to prolonged QT interval.  My colleague discussed with he and his wife about possible transition to PD at some time in the future if needed (defer to pt's primary nephrologist). No heparin with HD, pt is on eliquis.  Will try to maintain BP in HD.  HD today.  ? Volume depletion- 9 kg below dry wt, symptomatic dehydration due to HD, Cdif infection, poor po intake. Will give 1 L NS w/ HD today and not pull any fluid off.  ? Anemia  - stable-  hgb over 11-  no meds  ? Metabolic bone  disease -  noncompliant with meds. Supposed to be on phoslo and calcitriol ? AFib - hx of, on eliquis ? DM2 - per pmd ?Nutrition - renal diet, carb modified. ?Disposition - in CIR ? ? ?Kelly Splinter, MD ?04/28/2021, 3:55 PM ? ? ? ?Recent Labs  ?Lab 04/22/21 ?2831 04/23/21 ?5176 04/24/21 ?1607 04/25/21 ?1224 04/28/21 ?3710  ?NA 136 137 134* 136 134*  ?K 4.3 5.7* 4.0 4.6 4.4  ?CL 105 107 101 102 103  ?CO2 21* 16* 23 18* 14*  ?GLUCOSE 70 91 87 100* 79  ?BUN 43* 62* 40* 57* 75*  ?CREATININE 8.77* 11.23* 7.85* 10.18* 12.99*  ?ALBUMIN  --  3.2*  --  3.0* 2.9*  ?CALCIUM 8.5* 8.7* 8.3* 8.9 8.7*  ?PHOS  --   --   --  7.8* 8.6*  ?AST  --  12*  --   --   --   ?ALT  --  11  --   --   --   ? ? ?Liver Function Tests: ?Recent Labs  ?Lab 04/23/21 ?6269 04/25/21 ?1224 04/28/21 ?4854  ?AST 12*  --   --   ?  ALT 11  --   --   ?ALKPHOS 81  --   --   ?BILITOT 0.6  --   --   ?PROT 6.6  --   --   ?ALBUMIN 3.2* 3.0* 2.9*  ? ? ?No results for input(s): LIPASE, AMYLASE in the last 168 hours. ?No results for input(s): AMMONIA in the last 168 hours. ?CBC: ?Recent Labs  ?Lab 04/22/21 ?7121 04/23/21 ?9758 04/25/21 ?1224 04/28/21 ?8325  ?WBC 9.3 9.6 7.4 9.8  ?NEUTROABS  --  6.2  --   --   ?HGB 10.7* 11.5* 12.0* 12.3*  ?HCT 36.7* 40.9 39.4 41.0  ?MCV 80.0 80.0 77.9* 77.9*  ?PLT 156 171 160 223  ? ? ?Cardiac Enzymes: ?No results for input(s): CKTOTAL, CKMB, CKMBINDEX, TROPONINI in the last 168 hours. ?CBG: ?Recent Labs  ?Lab 04/25/21 ?4982 04/25/21 ?1205 04/25/21 ?1722 04/26/21 ?6415 04/26/21 ?8309  ?GLUCAP 88 108* 71 94 81  ? ? ? ?Iron Studies: No results for input(s): IRON, TIBC, TRANSFERRIN, FERRITIN in the last 72 hours. ?Studies/Results: ?No results found. ? amiodarone  200 mg Oral Daily  ? apixaban  5 mg Oral BID  ? artificial tears   Left Eye QID  ? atorvastatin  80 mg Oral Daily  ? calcitRIOL  0.5 mcg Oral Q T,Th,Sa-HD  ? calcium acetate  1,334 mg Oral TID WC  ? cholestyramine  4 g Oral TID  ? clopidogrel  75 mg Oral Daily  ? dorzolamide-timolol   1 drop Right Eye BID  ? hydrocortisone cream   Topical BID  ? lactobacillus  1 g Oral TID WC  ? methocarbamol  500 mg Oral TID  ? metoprolol tartrate  25 mg Oral TID  ? multivitamin  1 tablet Oral QHS  ? multivitamin with minerals  1 tablet Oral Daily  ? polycarbophil  625 mg Oral BID  ? prednisoLONE acetate  1 drop Left Eye Q4H  ? vancomycin  125 mg Oral QID  ? ? ?BMET ?   ?Component Value Date/Time  ? NA 134 (L) 04/28/2021 4076  ? K 4.4 04/28/2021 0635  ? CL 103 04/28/2021 0635  ? CO2 14 (L) 04/28/2021 8088  ? GLUCOSE 79 04/28/2021 0635  ? BUN 75 (H) 04/28/2021 1103  ? CREATININE 12.99 (H) 04/28/2021 1594  ? CALCIUM 8.7 (L) 04/28/2021 5859  ? GFRNONAA 4 (L) 04/28/2021 2924  ? GFRAA 5 (L) 08/27/2019 1540  ? ?CBC ?   ?Component Value Date/Time  ? WBC 9.8 04/28/2021 0635  ? RBC 5.26 04/28/2021 0635  ? HGB 12.3 (L) 04/28/2021 4628  ? HCT 41.0 04/28/2021 0635  ? PLT 223 04/28/2021 0635  ? MCV 77.9 (L) 04/28/2021 6381  ? MCH 23.4 (L) 04/28/2021 7711  ? MCHC 30.0 04/28/2021 0635  ? RDW 18.6 (H) 04/28/2021 6579  ? LYMPHSABS 1.6 04/23/2021 0919  ? MONOABS 1.1 (H) 04/23/2021 0919  ? EOSABS 0.6 (H) 04/23/2021 0919  ? BASOSABS 0.1 04/23/2021 0919  ? ? ? ? ? ? ? ? ?

## 2021-04-28 NOTE — Progress Notes (Signed)
Speech Language Pathology Daily Session Note ? ?Patient Details  ?Name: [name]. ?MRN: [id number] ?Date of Birth: 1970/04/06 ? ?Today's Date: 04/28/2021 ?SLP Individual Time: 4917-9150 ?SLP Individual Time Calculation (min): 40 min ? ?Short Term Goals: ?Week 1: SLP Short Term Goal 1 (Week 1): Pt will utilize external aids to recall novel information with min assist. ?SLP Short Term Goal 2 (Week 1): Patient will utilize compensatory memory strategies during functional tasks with Supervision ?SLP Short Term Goal 3 (Week 1): Pt will produce solutions to moderately complex problems with min A cues ?SLP Short Term Goal 4 (Week 1): Patient will complete mildly complex planning/scheduling tasks with min A verbal cues ?SLP Short Term Goal 5 (Week 1): Pt will complete alternating attention tasks with min A verbal cues for accuracy ? ?Skilled Therapeutic Interventions: Skilled ST treatment focused on cognitive goals. Pt received in wheelchair and  reported fatigue from being up all night with several episodes of diarrhea. This resulted in buttocks discomfort. Nurse notified and meds provided during session. Pt requested to get into bed and completed transfer with mod A to stand+pivot. Patient reported feeling close to cognitive baseline with the exception of mildly decreased recall. SLP facilitated session by educating on internal and external memory strategies. Facilitated functional recall task with emphasis on visualization and category/chunking strategies to recall up to 10 novel items on a grocery list. Pt recalled 9/10 following 1 minute delay, 8/10 following 5 minute delay, and 7/10 following 10 minute delay independent of cues. Pt successfully recalled 100% of items when given semantic cues with emphasis on categorization as discussed earlier.  ?Patient was left in bed with alarm activated and immediate needs within reach at end of session. Provided with 242mL cranberry juice per request in pt's effort to  increase fluid intake. Continue per current plan of care.   ?   ?Pain ?Pain Assessment ?Pain Scale: 0-10 ?Pain Score: 5  ?Pain Location: Buttocks ?Pain Descriptors / Indicators: Discomfort ?Pain Onset: On-going ?Pain Intervention(s): Medication (See eMAR);RN made aware ? ?Therapy/Group: Individual Therapy ? ?[name] [name] ?04/28/2021, 12:51 PM ?

## 2021-04-29 NOTE — Plan of Care (Signed)
?  Problem: RH Problem Solving Goal: LTG Patient will demonstrate problem solving for (SLP) Description: LTG:  Patient will demonstrate problem solving for basic/complex daily situations with cues  (SLP) Outcome: Completed/Met   Problem: RH Memory Goal: LTG Patient will demonstrate ability for day to day (SLP) Description: LTG:   Patient will demonstrate ability for day to day recall/carryover during cognitive/linguistic activities with assist  (SLP) Outcome: Completed/Met Goal: LTG Patient will use memory compensatory aids to (SLP) Description: LTG:  Patient will use memory compensatory aids to recall biographical/new, daily complex information with cues (SLP) Outcome: Completed/Met   Problem: RH Attention Goal: LTG Patient will demonstrate this level of attention during functional activites (SLP) Description: LTG:  Patient will will demonstrate this level of attention during functional activites (SLP) Outcome: Completed/Met   

## 2021-04-29 NOTE — Progress Notes (Signed)
?                                                       PROGRESS NOTE ? ? ?Subjective/Complaints: ? ?4 liquid stools yesterday , some peri anal pain related to hemorrhoid  ? ?ROS: Patient denies CP, SOB, N/V/D ? ?Objective: ?  ?No results found. ?Recent Labs  ?  04/28/21 ?0635  ?WBC 9.8  ?HGB 12.3*  ?HCT 41.0  ?PLT 223  ? ? ?Recent Labs  ?  04/28/21 ?0635  ?NA 134*  ?K 4.4  ?CL 103  ?CO2 14*  ?GLUCOSE 79  ?BUN 75*  ?CREATININE 12.99*  ?CALCIUM 8.7*  ? ? ? ?Intake/Output Summary (Last 24 hours) at 04/29/2021 0833 ?Last data filed at 04/28/2021 2038 ?Gross per 24 hour  ?Intake 120 ml  ?Output -1000 ml  ?Net 1120 ml  ? ?  ? ?  ? ?Physical Exam: ?Vital Signs ?Blood pressure 99/60, pulse 66, temperature 98 ?F (36.7 ?C), resp. rate 16, height 6\' 3"  (1.905 m), weight 78.1 kg, SpO2 97 %. ? ? ? ?General: No acute distress ?Mood and affect are appropriate ?Heart: Regular rate and rhythm no rubs murmurs or extra sounds ?Lungs: Clear to auscultation, breathing unlabored, no rales or wheezes ?Abdomen: Positive bowel sounds, soft nontender to palpation, nondistended ?Extremities: No clubbing, cyanosis, or edema ?Skin: No evidence of breakdown, no evidence of rash, IV site ok  ? ?Musculoskeletal: Full range of motion in all 4 extremities. No joint swelling ? ?Neuro:  alert and oriented x 3. Left facial droop. Speech generally clear. LUE 1+/5 deltoid to 3-/5 bicep, tricep, wrist/hand. LLE 3+ to 4-/5. RUE and RLE 5/5. Senses pain and light touch in all 4's. No resting tone--stable exam  ?Musculoskeletal: Reduced active , PROM Left shoulder  ? ? ?Assessment/Plan: ?1. Functional deficits which require 3+ hours per day of interdisciplinary therapy in a comprehensive inpatient rehab setting. ?Physiatrist is providing close team supervision and 24 hour management of active medical problems listed below. ?Physiatrist and rehab team continue to assess barriers to discharge/monitor patient progress toward functional and medical goals ? ?Care  Tool: ? ?Bathing ?   ?Body parts bathed by patient: Left arm, Right upper leg, Left upper leg, Face  ? Body parts bathed by helper: Chest, Abdomen, Right arm, Front perineal area, Buttocks, Right lower leg, Left lower leg ?  ?  ?Bathing assist Assist Level: Total Assistance - Patient < 25% ?  ?  ?Upper Body Dressing/Undressing ?Upper body dressing Upper body dressing/undressing activity did not occur (including orthotics): Safety/medical concerns ?What is the patient wearing?: [place] only ?   ?Upper body assist Assist Level: Dependent - Patient 0% ?   ?Lower Body Dressing/Undressing ?Lower body dressing ? ? ? Lower body dressing activity did not occur: Safety/medical concerns ?What is the patient wearing?: Incontinence brief ? ?  ? ?Lower body assist Assist for lower body dressing: Dependent - Patient 0% ?   ? ?Toileting ?Toileting    ?Toileting assist Assist for toileting: Maximal Assistance - Patient 25 - 49% ?  ?  ?Transfers ?Chair/bed transfer ? ?Transfers assist ? Chair/bed transfer activity did not occur: Safety/medical concerns ? ?Chair/bed transfer assist level: Minimal Assistance - Patient > 75% ?  ?  ?Locomotion ?Ambulation ? ? ?Ambulation assist ? ?   ? ?Assist  level: Minimal Assistance - Patient > 75% ?Assistive device: Walker-rolling ?Max distance: 175 ft  ? ?Walk 10 feet activity ? ? ?Assist ?   ? ?Assist level: Contact Guard/Touching assist ?Assistive device: Walker-rolling  ? ?Walk 50 feet activity ? ? ?Assist   ? ?Assist level: Minimal Assistance - Patient > 75% ?Assistive device: Walker-rolling  ? ? ?Walk 150 feet activity ? ? ?Assist Walk 150 feet activity did not occur: Safety/medical concerns ? ?Assist level: Minimal Assistance - Patient > 75% ?Assistive device: Walker-rolling ?  ? ?Walk 10 feet on uneven surface  ?activity ? ? ?Assist Walk 10 feet on uneven surfaces activity did not occur: Safety/medical concerns ? ? ?  ?   ? ?Wheelchair ? ? ? ? ?Assist Is the patient using a  wheelchair?: Yes ?Type of Wheelchair: Manual ?  ? ?Wheelchair assist level: Dependent - Patient 0% ?Max wheelchair distance: 150  ? ? ?Wheelchair 50 feet with 2 turns activity ? ? ? ?Assist ? ?  ?  ? ? ?Assist Level: Dependent - Patient 0%  ? ?Wheelchair 150 feet activity  ? ? ? ?Assist ?   ? ? ?Assist Level: Dependent - Patient 0%  ? ?Blood pressure 99/60, pulse 66, temperature 98 ?F (36.7 ?C), resp. rate 16, height 6\' 3"  (1.905 m), weight 78.1 kg, SpO2 97 %. ? ?Medical Problem List and Plan: ?1. Functional deficits secondary to cerebral ischemic stroke, bilateral due to global hypoperfusion ?            -patient may shower ?            -ELOS/Goals: 10-14 days S ?            -Continue CIR therapies including PT, OT, and SLP   ?2.  Antithrombotics: ?-DVT/anticoagulation:  Pharmaceutical:  -resumed Eliquis ?            -antiplatelet therapy: Resume Plavix ?3. Pain Management: Tylenol prn.  ?4. Mood: LCSW to follow for evaluation and support.  ?            -antipsychotic agents: NA ?5. Neuropsych: This patient may be capable of making decisions on his own behalf. ?6. Skin/Wound Care:  Routine pressure relief measures.  ?7. Fluids/Electrolytes/Nutrition: Strict I/O --not on any fluid restriction at this time.  ?8. Hypertension: Monitor for orthostatic/syncopal episodes with increase in activity. ?            --continue metoprolol tid  ?  ?Vitals:  ? 04/28/21 2042 04/29/21 0317  ?BP: 107/67 99/60  ?Pulse: 77 66  ?Resp: 16 16  ?Temp: 97.9 ?F (36.6 ?C) 98 ?F (36.7 ?C)  ?SpO2: 100% 97%  ?Controlled 3/8 ? ?9. CAF: Monitor HR TID ?10. T2DM w/neuropathy and gastroparesis: Not on any meds as A1C normalized-4.7.  ?            --BS have been well controlled.--can dc cbg's and SSI  .   ?11. H/o GERD w/gastritis/esophagitis: ?61. ESRD: Continue HD TTS at the end of the day to help with tolerance of therapy. ?            -- on phoslo and calcitriol for metabolic bone disese. ?            --not in favor of transitioning to PD. Plans  for graft in LUE? ?Messaged Nephro, regarding diarrhea, fluid loss, potential for Hypo K+  ?13. Anemia of chronic disease: Stable w/ Hgb @ 11.5 on 03/02. Repeat Hgb tomorrow.  ?14. Glaucoma:s/p Left orbital  implant Resume cosopt eye drops.  ?13. Diarrhea: persistent, Only 1 BM last night  ?  ?+ C diff on Vanc  ?-added fiber for bulking.  ?-Increase cholestyramine to TID  ?-on no scheduled softeners or laxatives ?  ? ?LOS: ?5 days ?A FACE TO FACE EVALUATION WAS PERFORMED ? ?Luanna Salk Lazarius Rivkin ?04/29/2021, 8:33 AM  ? ?  ?

## 2021-04-29 NOTE — Progress Notes (Signed)
Speech Language Pathology Discharge Summary ? ?Patient Details  ?Name: [name]. ?MRN: [id number] ?Date of Birth: Dec 12, 1970 ? ?Today's Date: 04/29/2021 ?SLP Individual Time: 1300-1400 ?SLP Individual Time Calculation (min): 60 min ? ?Skilled Therapeutic Interventions: Skilled ST treatment focused on cognitive goals. SLP facilitated session by providing overall sup A for implementation of internal memory strategies for visual recall task in which patient successfully recalled spatial orientation of 8 items using association, chunking, and visualization strategies following 10 minute delay. SLP also facilitated complex problem solving, error awareness, and alternating attention with medication management task. Patient completed with min-to-mod A cues fading to sup A level cues for organization, attention, and error awareness. Efficiency was mildly affected by visual deficits. Pt reported feeling at cognitive baseline with wishes to focus on PT/OT at this time. Spouse supported that patient was likely at baseline level of function as reported to evaluating therapist during initial assessment and denied any noticeable cognitive changes s/p CVA. With patient currently being at goal level and likely at baseline, plan to discharge from [facility] services at this time. Pt verbalized agreement.  ?Patient was left in bed with alarm activated and immediate needs within reach at end of session.  ? ?Patient has met 4 of 4 long term goals.  Patient to discharge at overall Supervision level.  ?Reasons goals not met: All goals met  ? ?Clinical Impression/Discharge Summary: Patient has made functional gains and has met 4 of 4 long-term goals this admission in the areas of functional recall, complex problem solving, and alternating attention. Pt reports being at cognitive baseline which was further supported by spouse as reported to evaluating SLP during initial evaluation. Patient is currently an overall supervision assist for  cognitive tasks and recommended to have support with iADLs and complex cognitive tasks, as he reportedly received at prior level. Patient education is complete and patient to discharge at overall supervision level. Patient's care partner is independent to provide the necessary physical and cognitive assistance at discharge. Follow up SLP services do not appear indicated at this time.  ? ?Care Partner:  ?Caregiver Able to Provide Assistance: Yes  ?Type of Caregiver Assistance: Physical;Cognitive ? ?Recommendation:  ?Other (comment) (intermittent supervision with iADLs, complex cognitive tasks)  ?Rationale for SLP Follow Up: Other (comment) (none)  ? ?Equipment: none  ? ?Reasons for discharge: Treatment goals met;Other (comment) (pt likely at cognitive baseline)  ? ?Patient/Family Agrees with Progress Made and Goals Achieved: Yes  ? ? ?Charlot Gouin T Elliana Bal ?04/29/2021, 4:15 PM ? ?

## 2021-04-29 NOTE — Progress Notes (Signed)
Physical Therapy Session Note ? ?Patient Details  ?Name: [name]. ?MRN: [id number] ?Date of Birth: 09-25-1970 ? ?Today's Date: 04/29/2021 ?PT Individual Time: 9563-8756 ?PT Individual Time Calculation (min): 26 min  ? ?Short Term Goals: ?Week 1:  PT Short Term Goal 1 (Week 1): pt will perform bed mobility with min assist ?PT Short Term Goal 2 (Week 1): Pt will ambulate 35ft with min assist ?PT Short Term Goal 3 (Week 1): Pt will trasnfer to [facility] with min assist and LRAd ?PT Short Term Goal 4 (Week 1): Pt will perform TUG in < 1 minute ? ?Skilled Therapeutic Interventions/Progress Updates:  ? Received pt semi-reclined in bed, pt agreeable to PT treatment, and denied any pain during session. Pt reported working on balance and walking this morning and requesting to work on L arm exercises this afternoon. Session with emphasis on functional mobility and generalized strengthening/ROM. Pt transferred semi-reclined<>sitting EOB with supervision with HOB elevated and performed the following exercises with supervision and verbal cues for technique with emphasis on UE strength/ROM: ?-LUE bicep curls with red TB 4x12 ?-L shoulder abduction 2x8 (decreased ROM) ?-L shoulder IR/ER 2x10 ?-bilateral horizontal shoulder ER with red TB 2x10 (decreased ROM on LUE) ?Pt transferred sit<>semi-reclined with supervision and concluded session with pt semi-reclined in bed, needs within reach, and bed alarm on.  ? ?Therapy Documentation ?Precautions:  ?Precautions ?Precautions: Fall ?Restrictions ?Weight Bearing Restrictions: No ? ?Therapy/Group: Individual Therapy ?Blenda Nicely ?Becky Sax PT, DPT  ? ?04/29/2021, 7:34 AM  ?

## 2021-04-29 NOTE — Progress Notes (Signed)
Patient ID: [name] Tuggle Jr., male   DOB: 08/21/1970, 51 y.o.   MRN: [id number] ? ?Team Conference Report to Patient/Family ? ?Team Conference discussion was reviewed with the patient and caregiver, including goals, any changes in plan of care and target discharge date.  Patient and caregiver express understanding and are in agreement.  The patient has a target discharge date of 05/08/21. ? ?Sw met with patient and provided team conference updates. SW made attempt to call patient spouse, left VM. No questions or concerns from the patient, Patient reports pain has improved some. Anticipating SLP d/c soon. SW will continue to follow up ? ? J  ?04/29/2021, 1:50 PM  ?

## 2021-04-29 NOTE — Progress Notes (Signed)
Physical Therapy Session Note ? ?Patient Details  ?Name: [name]. ?MRN: [id number] ?Date of Birth: [birth year] ? ?Today's Date: 04/29/2021 ?PT Individual Time: 2197-5883 ?PT Individual Time Calculation (min): 56 min  ? ?Short Term Goals: ?Week 1:  PT Short Term Goal 1 (Week 1): pt will perform bed mobility with min assist ?PT Short Term Goal 2 (Week 1): Pt will ambulate 23f with min assist ?PT Short Term Goal 3 (Week 1): [facility] ?PT Short Term Goal 4 (Week 1): Pt will perform TUG in < 1 minute ?Week 2:    ? ?Skilled Therapeutic Interventions/Progress Updates:  ? ?Pt received supine in bed and agreeable to PT. Supine>sit transfer with supervision assist and heavy cues for use of bed features. Noted to have improved use of LUE shoulder to initiate reach to the R through log roll and then use of BUE to push in to sitting. [facility]. PT adjusted feet of RW to improve safety in turns and limit need to lift RW.  ? ?Hand hygiene at sink with set up assist. Gait training through hall with supervision assist and RW. Dynamic gait training with RW to weave through 4 cones x 4. Stepping over 2 hockey sticks x 2. Side stepping in parallel bars 2x 2 bil. Forward reverse 180fx 2 each. Cues for improved step length and decreased compensation through pelvic rotation.  ? ?PT provided pt with gel hybrid cushion for pain management at hemorrhoid site with education for gel positioning for pressure relief.  ? ?Throughout session, sit<>stand performed with supervision assist and cues for improved UE and LE position. Noted to have improved use of LUE compared to previous PT treatments.  ? ?Pt returned to room and performed stand pivot transfer to bed with RW and supervision assist. Sit>supine completed with increased time and supervision assist due to pain in buttock and left supine in bed with call bell in reach and all needs met.  ?  ? ?    ? ?Therapy Documentation ?Precautions:  ?Precautions ?Precautions: Fall ?Restrictions ?Weight Bearing Restrictions: No ? ?  ?Pain: ?denies ? ? ?Therapy/Group: Individual Therapy ? ?AuLorie Phenix3/09/2021, 12:06 PM  ?

## 2021-04-29 NOTE — Progress Notes (Signed)
Occupational Therapy Session Note ? ?Patient Details  ?Name: Fritz Cauthon. ?MRN: [id number] ?Date of Birth: [birth year] ? ?Today's Date: 04/29/2021 ?OT Individual Time: [phone] ?OT Individual Time Calculation (min): 45 min  and Today's Date: 04/29/2021 ?OT Missed Time: 15 Minutes ?Missed Time Reason: Other (comment) (previous pt care) ? ? ?Short Term Goals: ?Week 1:  OT Short Term Goal 1 (Week 1): Pt will bathe UB with no A except to facilitate LUE To wash RUE + min cuing ?OT Short Term Goal 2 (Week 1): Pt will completes oral care wiht MOD HOH A ?OT Short Term Goal 3 (Week 1): Pt will recall hemi dressing techniques wiht MIN VC ?OT Short Term Goal 4 (Week 1): Pt will transfer to toilet wiht MIN A ? ? ?Skilled Therapeutic Interventions/Progress Updates:  ?  Pt greeted at time of session semireclined bed level with MD finishing up rounds. Missed 15 mins at beginning of session 2/2 previous pt care. Pt on bed pan and stating he is finished using it. Performing bed mobility for rolling to the L with Supervision, rolling to R with CGA/Min for OT to remove bed pan and perforn posterior hygiene/cleaning. Donned new brief bed level for time and energy. Supine > sit with Min/CGA and stand pivot to wheelchair with Min A overall with RW. Set up at sink for bathing tasks, performed with MOD A overall, unable to reach past knee level for bathing or dressing 2/2 stating this hurts his rectum/hemorrhoids and OT assisting. MOD for LB dress and bathing at sink level. Donned hospital gown as well with MOD A and extended time with cues for hemitechniques. Stand pivot back to bed with CGA with RW. Hand off to nursing for med pass.  ? ?Therapy Documentation ?Precautions:  ?Precautions ?Precautions: Fall ?Restrictions ?Weight Bearing Restrictions: No ? ? ? ?Therapy/Group: Individual Therapy ? ?Viona Gilmore ?04/29/2021, 7:20 AM ?

## 2021-04-30 LAB — CBC WITH DIFFERENTIAL/PLATELET
Abs Immature Granulocytes: 0.09 10*3/uL — ABNORMAL HIGH (ref 0.00–0.07)
Basophils Absolute: 0.1 10*3/uL (ref 0.0–0.1)
Basophils Relative: 1 %
Eosinophils Absolute: 0.4 10*3/uL (ref 0.0–0.5)
Eosinophils Relative: 4 %
HCT: 39.4 % (ref 39.0–52.0)
Hemoglobin: 11.9 g/dL — ABNORMAL LOW (ref 13.0–17.0)
Immature Granulocytes: 1 %
Lymphocytes Relative: 25 %
Lymphs Abs: 2.2 10*3/uL (ref 0.7–4.0)
MCH: 23.6 pg — ABNORMAL LOW (ref 26.0–34.0)
MCHC: 30.2 g/dL (ref 30.0–36.0)
MCV: 78 fL — ABNORMAL LOW (ref 80.0–100.0)
Monocytes Absolute: 0.8 10*3/uL (ref 0.1–1.0)
Monocytes Relative: 9 %
Neutro Abs: 5.2 10*3/uL (ref 1.7–7.7)
Neutrophils Relative %: 60 %
Platelets: 278 10*3/uL (ref 150–400)
RBC: 5.05 MIL/uL (ref 4.22–5.81)
RDW: 18.6 % — ABNORMAL HIGH (ref 11.5–15.5)
WBC: 8.8 10*3/uL (ref 4.0–10.5)
nRBC: 0 % (ref 0.0–0.2)

## 2021-04-30 LAB — RENAL FUNCTION PANEL
Albumin: 3 g/dL — ABNORMAL LOW (ref 3.5–5.0)
Anion gap: 13 (ref 5–15)
BUN: 53 mg/dL — ABNORMAL HIGH (ref 6–20)
CO2: 21 mmol/L — ABNORMAL LOW (ref 22–32)
Calcium: 8.8 mg/dL — ABNORMAL LOW (ref 8.9–10.3)
Chloride: 105 mmol/L (ref 98–111)
Creatinine, Ser: 11.17 mg/dL — ABNORMAL HIGH (ref 0.61–1.24)
GFR, Estimated: 5 mL/min — ABNORMAL LOW (ref >60)
Glucose, Bld: 100 mg/dL — ABNORMAL HIGH (ref 70–99)
Phosphorus: 6.8 mg/dL — ABNORMAL HIGH (ref 2.5–4.6)
Potassium: 4.2 mmol/L (ref 3.5–5.1)
Sodium: 139 mmol/L (ref 135–145)

## 2021-04-30 MED ORDER — SODIUM CHLORIDE 0.9 % IV BOLUS
1000.0000 mL | Freq: Once | INTRAVENOUS | Status: AC
  Administered 2021-05-01: 1000 mL via INTRAVENOUS

## 2021-04-30 MED ORDER — HEPARIN SODIUM (PORCINE) 1000 UNIT/ML DIALYSIS
5000.0000 [IU] | INTRAMUSCULAR | Status: DC | PRN
Start: 2021-04-30 — End: 2021-05-04
  Administered 2021-05-02: 5000 [IU] via INTRAVENOUS_CENTRAL
  Filled 2021-04-30: qty 5

## 2021-04-30 NOTE — Patient Care Conference (Signed)
Inpatient RehabilitationTeam Conference and Plan of Care Update ?Date: 04/29/2021   Time: 10:03 AM  ? ? ?Patient Name: [name].      ?Medical Record Number: [id number]  ?Date of Birth: 11/03/1970 ?Sex: Male         ?Room/Bed: 4M03C/4M03C-01 ?Payor Info: Payor: [employer] MEDICARE / Plan: [place] HMO / Product Type: *No Product type* /   ? ?Admit Date/Time:  04/24/2021  2:16 PM ? ?Primary Diagnosis:  Cerebral ischemic stroke due to global hypoperfusion with watershed infarct [facility]) ? ?Hospital Problems: Principal Problem: ?  Cerebral ischemic stroke due to global hypoperfusion with watershed infarct [facility]) ? ? ? ?Expected Discharge Date: Expected Discharge Date: 05/08/21 ? ?Team Members Present: ?Physician leading conference: Dr. Alysia Penna ?Social Worker Present: Erlene Quan, BSW ?Nurse Present: [name], RN ?PT Present: Barrie Folk, PT ?OT Present: Lillia Corporal, OT ?SLP Present: Sherren Kerns, SLP ?PPS Coordinator present : Gunnar Fusi, SLP ? ?   Current Status/Progress Goal Weekly Team Focus  ?Bowel/Bladder ? ? continent of bowels (c-diff +), anuric for bladder         ?Swallow/Nutrition/ Hydration ? ?           ?ADL's ? ? UB dress MOD, LB dress MOD, CGA transfers with RW, fatigues quickly, LUE improving  Min toilet/LB dress, Supervision bathe and tranfsers  AE for LB, ADL retraining, global endurance, standing balance, tolerance, LUE NMR   ?Mobility ? ? c-diff (+), no L eye; Bed mobility = CGA/ MinA, transfers = CGA/ MinA, ambulation  = CGA using RW for <75 feet  supervision/ CGA for all mobility  standing balance, activity tolerance, general strength, LOA required for transfers/ gait, safety awareness, hydration, family education   ?Communication ? ?           ?Safety/Cognition/ Behavioral Observations ? min A  sup A  problem solving, memory, attention   ?Pain ? ? prn medication for pain         ?Skin ? ? old right arm fistula. left chest cath (HD)         ? ? ?Discharge Planning:   ?Discharging home with spouse to assist. 24/7 supervision avaliable   ?Team Discussion: ?Patient with C-diff; MD adding bulking medications. Able to incorporate left upper extremity into use with bathing and dressing and making progress overall.  ?Patient on target to meet rehab goals: ?yes, currently need CGA  - min assist for transfers and able to ambulate up to 75'.  ? ?*See Care Plan and progress notes for long and short-term goals.  ? ?Revisions to Treatment Plan:  ?Cognitively at baseline; reinforce education on memory and problem solving. SLP services discontinued as cognitively at baseline. ?Discharge date moved up; ?Has appt for parathyroidectomy consult at [facility] on 3/15 which has been difficult to schdeule and pt does not wish to miss , wants to be d/ced no earlier than 3/14, which I believe may be a dialysis day, may need to d/c 3/13 ? ?Teaching Needs: ?Safety, transfers, medication management, secondary risk management, etc  ?Current Barriers to Discharge: ?Decreased caregiver support and Hemodialysis ? ?Possible Resolutions to Barriers: ?Family education ?  ? ? Medical Summary ?Current Status: Severe diarrhea, C diff, on HD ? Barriers to Discharge: Hemodialysis;Medical stability ?  ?Possible Resolutions to [employer]: Switching abx for C Diff, adding bulk forming agents, appreciate nephro assist ? ? ?Continued Need for Acute Rehabilitation Level of Care: The patient requires daily medical management by a physician with specialized  training in physical medicine and rehabilitation for the following reasons: ?Direction of a multidisciplinary physical rehabilitation program to maximize functional independence : Yes ?Medical management of patient stability for increased activity during participation in an intensive rehabilitation regime.: Yes ?Analysis of laboratory values and/or radiology reports with any subsequent need for medication adjustment and/or medical intervention. : Yes ? ? ?I attest  that I was present, lead the team conference, and concur with the assessment and plan of the team. ? ? ?[name] B ?04/30/2021, 8:25 AM  ? ? ? ? ? ? ?

## 2021-04-30 NOTE — Progress Notes (Signed)
Occupational Therapy Session Note ? ?Patient Details  ?Name: [name]. ?MRN: [id number] ?Date of Birth: 1971-01-11 ? ?Today's Date: 04/30/2021 ?OT Individual Time: 7011-0034 ?OT Individual Time Calculation (min): 53 min  ? ? ?Short Term Goals: ?Week 1:  OT Short Term Goal 1 (Week 1): Pt will bathe UB with no A except to facilitate LUE To wash RUE + min cuing ?OT Short Term Goal 2 (Week 1): Pt will completes oral care wiht MOD HOH A ?OT Short Term Goal 3 (Week 1): Pt will recall hemi dressing techniques wiht MIN VC ?OT Short Term Goal 4 (Week 1): Pt will transfer to toilet wiht MIN A ? ?Patient met lying supine in bed in agreement with OT treatment session. 0/10 pain reported at rest and with activity. Supine to EOB with supervision A and cues for hand placement and use of bed rail. Sit to stand from EOB with supervision A and cues for hand placement. Patient incontinent of bowel and seemingly unaware. Min A for 3/3 parts of toileting task and UB bathing seated on BSC over standard heigh toilet. Mod A for LB bathing/dressing. Functional mobility with RW and supervision A throughout session. Patient demonstrates good use of LUE throughout task with only Min A cueing. Patient declined completion of oral hygiene prior to breakfast meal. Session concluded with patient seated in wc with call bell within reach, belt alarm activated and all needs met.  ? ?Therapy Documentation ?Precautions:  ?Precautions ?Precautions: Fall ?Restrictions ?Weight Bearing Restrictions: No ?General: ?  ? ?Therapy/Group: Individual Therapy ? ?[name] ?04/30/2021, 6:51 AM ?

## 2021-04-30 NOTE — Progress Notes (Signed)
?                                                       PROGRESS NOTE ? ? ?Subjective/Complaints: ?Pt would like to move up D/C date to allow him to see surgeon for parathyroidectomy on 3/15 ?5 liquid stools yesterday , some peri anal pain related to hemorrhoid , donut cushion ordered  ? ?ROS: Patient denies CP, SOB, N/V/D ? ?Objective: ?  ?No results found. ?Recent Labs  ?  04/28/21 ?0635  ?WBC 9.8  ?HGB 12.3*  ?HCT 41.0  ?PLT 223  ? ? ?Recent Labs  ?  04/28/21 ?0635  ?NA 134*  ?K 4.4  ?CL 103  ?CO2 14*  ?GLUCOSE 79  ?BUN 75*  ?CREATININE 12.99*  ?CALCIUM 8.7*  ? ? ? ?Intake/Output Summary (Last 24 hours) at 04/30/2021 0803 ?Last data filed at 04/29/2021 1839 ?Gross per 24 hour  ?Intake 600 ml  ?Output --  ?Net 600 ml  ? ?  ? ?  ? ?Physical Exam: ?Vital Signs ?Blood pressure 139/82, pulse (!) 58, temperature 98.6 ?F (37 ?C), resp. rate 16, height 6\' 3"  (1.905 m), weight 78.2 kg, SpO2 99 %. ? ? ? ?General: No acute distress ?Mood and affect are appropriate ?Heart: Regular rate and rhythm no rubs murmurs or extra sounds ?Lungs: Clear to auscultation, breathing unlabored, no rales or wheezes ?Abdomen: Positive bowel sounds, soft nontender to palpation, nondistended ?Extremities: No clubbing, cyanosis, or edema ?Skin: No evidence of breakdown, no evidence of rash, IV site ok  ? ?Musculoskeletal: Full range of motion in all 4 extremities. No joint swelling ? ?Neuro:  alert and oriented x 3. Left facial droop. Speech generally clear. LUE 1+/5 deltoid to 3-/5 bicep, tricep, wrist/hand. LLE 3+ to 4-/5. RUE and RLE 5/5. Senses pain and light touch in all 4's. No resting tone--stable exam  ?Musculoskeletal: Reduced active , PROM Left shoulder  ? ? ?Assessment/Plan: ?1. Functional deficits which require 3+ hours per day of interdisciplinary therapy in a comprehensive inpatient rehab setting. ?Physiatrist is providing close team supervision and 24 hour management of active medical problems listed below. ?Physiatrist and rehab  team continue to assess barriers to discharge/monitor patient progress toward functional and medical goals ? ?Care Tool: ? ?Bathing ?   ?Body parts bathed by patient: Left arm, Right upper leg, Left upper leg, Face, Right arm, Chest, Abdomen  ? Body parts bathed by helper: Buttocks, Right lower leg, Left lower leg ?  ?  ?Bathing assist Assist Level: Moderate Assistance - Patient 50 - 74% ?  ?  ?Upper Body Dressing/Undressing ?Upper body dressing Upper body dressing/undressing activity did not occur (including orthotics): Safety/medical concerns ?What is the patient wearing?: [place] only ?   ?Upper body assist Assist Level: Moderate Assistance - Patient 50 - 74% ?   ?Lower Body Dressing/Undressing ?Lower body dressing ? ? ? Lower body dressing activity did not occur: Safety/medical concerns ?What is the patient wearing?: Pants ? ?  ? ?Lower body assist Assist for lower body dressing: Moderate Assistance - Patient 50 - 74% ?   ? ?Toileting ?Toileting    ?Toileting assist Assist for toileting: Maximal Assistance - Patient 25 - 49% ?  ?  ?Transfers ?Chair/bed transfer ? ?Transfers assist ? Chair/bed transfer activity did not occur: Safety/medical concerns ? ?Chair/bed transfer  assist level: Minimal Assistance - Patient > 75% ?  ?  ?Locomotion ?Ambulation ? ? ?Ambulation assist ? ?   ? ?Assist level: Minimal Assistance - Patient > 75% ?Assistive device: Walker-rolling ?Max distance: 175 ft  ? ?Walk 10 feet activity ? ? ?Assist ?   ? ?Assist level: Contact Guard/Touching assist ?Assistive device: Walker-rolling  ? ?Walk 50 feet activity ? ? ?Assist   ? ?Assist level: Minimal Assistance - Patient > 75% ?Assistive device: Walker-rolling  ? ? ?Walk 150 feet activity ? ? ?Assist Walk 150 feet activity did not occur: Safety/medical concerns ? ?Assist level: Minimal Assistance - Patient > 75% ?Assistive device: Walker-rolling ?  ? ?Walk 10 feet on uneven surface  ?activity ? ? ?Assist Walk 10 feet on uneven surfaces  activity did not occur: Safety/medical concerns ? ? ?  ?   ? ?Wheelchair ? ? ? ? ?Assist Is the patient using a wheelchair?: Yes ?Type of Wheelchair: Manual ?  ? ?Wheelchair assist level: Dependent - Patient 0% ?Max wheelchair distance: 150  ? ? ?Wheelchair 50 feet with 2 turns activity ? ? ? ?Assist ? ?  ?  ? ? ?Assist Level: Dependent - Patient 0%  ? ?Wheelchair 150 feet activity  ? ? ? ?Assist ?   ? ? ?Assist Level: Dependent - Patient 0%  ? ?Blood pressure 139/82, pulse (!) 58, temperature 98.6 ?F (37 ?C), resp. rate 16, height 6\' 3"  (1.905 m), weight 78.2 kg, SpO2 99 %. ? ?Medical Problem List and Plan: ?1. Functional deficits secondary to cerebral ischemic stroke, bilateral due to global hypoperfusion ?            -patient may shower ?            -ELOS/Goals: 3/14 , pt has a ENT office consult for parathyroidectomy on 3/15 which has been difficult to schedule  and does not want to miss,  ?            -Continue CIR therapies including PT, OT, and SLP   ?2.  Antithrombotics: ?-DVT/anticoagulation:  Pharmaceutical:  -resumed Eliquis ?            -antiplatelet therapy: Resume Plavix ?3. Pain Management: Tylenol prn.  ?4. Mood: LCSW to follow for evaluation and support.  ?            -antipsychotic agents: NA ?5. Neuropsych: This patient may be capable of making decisions on his own behalf. ?6. Skin/Wound Care:  Routine pressure relief measures.  ?7. Fluids/Electrolytes/Nutrition: Strict I/O --not on any fluid restriction at this time.  ?8. Hypertension: Monitor for orthostatic/syncopal episodes with increase in activity. ?            --continue metoprolol tid  ?  ?Vitals:  ? 04/29/21 1833 04/30/21 0514  ?BP: (!) 157/89 139/82  ?Pulse: 68 (!) 58  ?Resp: 17 16  ?Temp: 98.2 ?F (36.8 ?C) 98.6 ?F (37 ?C)  ?SpO2: 96% 99%  ?Controlled 3/9 ? ?9. CAF: Monitor HR TID ?10. T2DM w/neuropathy and gastroparesis: Not on any meds as A1C normalized-4.7.  ?            --BS have been well controlled.--can dc cbg's and SSI  .   ?11.  H/o GERD w/gastritis/esophagitis: ?74. ESRD: Continue HD TTS at the end of the day to help with tolerance of therapy. ?            -- on phoslo and calcitriol for metabolic bone disese. ?            --  not in favor of transitioning to PD. Plans for graft in LUE? ?Messaged Nephro, regarding diarrhea, fluid loss, potential for Hypo K+  ?BMP Latest Ref Rng & Units 04/28/2021 04/25/2021 04/24/2021  ?Glucose 70 - 99 mg/dL 79 100(H) 87  ?BUN 6 - 20 mg/dL 75(H) 57(H) 40(H)  ?Creatinine 0.61 - 1.24 mg/dL 12.99(H) 10.18(H) 7.85(H)  ?Sodium 135 - 145 mmol/L 134(L) 136 134(L)  ?Potassium 3.5 - 5.1 mmol/L 4.4 4.6 4.0  ?Chloride 98 - 111 mmol/L 103 102 101  ?CO2 22 - 32 mmol/L 14(L) 18(L) 23  ?Calcium 8.9 - 10.3 mg/dL 8.7(L) 8.9 8.3(L)  ?  ?13. Anemia of chronic disease: Stable w/ Hgb @ 11.5 on 03/02. Repeat Hgb tomorrow.  ?14. Glaucoma:s/p Left orbital implant Resume cosopt eye drops.  ?13. Diarrhea: persistent,  ?  ?+ C diff on Vanc  ?-added fiber for bulking.  ?-Increase cholestyramine to TID  ?-on no scheduled softeners or laxatives ?  ? ?LOS: ?6 days ?A FACE TO FACE EVALUATION WAS PERFORMED ? ?[name] ?04/30/2021, 8:03 AM  ? ?  ?

## 2021-04-30 NOTE — Progress Notes (Signed)
Physical Therapy Session Note ? ?Patient Details  ?Name: [name]. ?MRN: [id number] ?Date of Birth: Dec 16, 1970 ? ?Today's Date: 04/30/2021 ?PT Individual Time: 2080-2233 ?PT Individual Time Calculation (min): 55 min  ? ?Short Term Goals: ?Week 1:  PT Short Term Goal 1 (Week 1): pt will perform bed mobility with min assist ?PT Short Term Goal 2 (Week 1): Pt will ambulate 39f with min assist ?PT Short Term Goal 3 (Week 1): [facility] ?PT Short Term Goal 4 (Week 1): Pt will perform TUG in < 1 minute ? ?Skilled Therapeutic Interventions/Progress Updates: Pt presented in w/c agreeable to therapy. Pt denies pain during session. Pt transported to day room for time management. Participated in obstacle course with RW including weaving through and tapping cones, stepping over thresholds, and standing without AD on Airex. Pt was overall CGA for ambulation with min cues for safety with RW. Pt demonstrating no LOB with activity. Pt noted to have decreased ankle strategy while standing on Airex intermittently leaning forward and attempting to reach.When PTA provided cues for postural correction pt noted to have decreased sway. Pt then indicated increased "stiffness" at rest, demonstrated hip flexor stretch in supine with pt able to perform 1 min x 2 bilaterally with pt indicating some relief. Pt then set up to participate in bout of cornhole, upon standing pt indicating increased dizziness. Pt returned to sitting and BP checked 85/63 (71) HR 59. Pt able to stand with BP checked 70/52 (52) HR 60 with mild symptoms. Performed stand pivot to w/c and transported to NuStep with pt performing stand pivot with CGA. Pt participated in NuStep L3 x 6 min with BP assessed 86/64 (72) HR 57. Pt returned to w/c in same manner as prior. Pt transported back to room and remained in w/c with belt alarm on, call bell within reach and needs met.  ?   ? ?Therapy Documentation ?Precautions:   ?Precautions ?Precautions: Fall ?Restrictions ?Weight Bearing Restrictions: No ?General: ?  ?Vital Signs: ?Therapy Vitals ?Temp: (!) 97.5 ?F (36.4 ?C) ?Temp Source: Temporal ?Pulse Rate: 70 ?Resp: 11 ?BP: (!) 157/88 ?Patient Position (if appropriate): Lying ?Pain: ?Pain Assessment ?Pain Scale: 0-10 ?Pain Score: 0-No pain ?Mobility: ?  ?Locomotion : ?   ?Trunk/Postural Assessment : ?   ?Balance: ?  ?Exercises: ?  ?Other Treatments:   ? ? ? ?Therapy/Group: Individual Therapy ? ?Johathon Overturf ?04/30/2021, 4:07 PM  ?

## 2021-04-30 NOTE — Progress Notes (Signed)
Physical Therapy Session Note ? ?Patient Details  ?Name: [name]. ?MRN: [id number] ?Date of Birth: 20-Dec-1970 ? ?Today's Date: 04/30/2021 ?PT Individual Time: 1000-1058 ?PT Individual Time Calculation (min): 58 min  ? ?Short Term Goals: ?Week 1:  PT Short Term Goal 1 (Week 1): pt will perform bed mobility with min assist ?PT Short Term Goal 2 (Week 1): Pt will ambulate 68f with min assist ?PT Short Term Goal 3 (Week 1): [facility] and LRAd ?PT Short Term Goal 4 (Week 1): Pt will perform TUG in < 1 minute ? ?Skilled Therapeutic Interventions/Progress Updates:  ?   ? ?Pt sitting in w/c to start session - agreeable to PT tx. Denies pain but reports low BP from previous PT session. Transported in w/c to day room rehab gym and assisted onto Nustep for cardiovascular endurance and to promote blood flow. Completed 9 minutes with workload 5, using BLE/BUE, able to complete without rest break while maintaining ~50 steps/minute cadence. Required straps to assist with preventing feet from sliding off of pedals. BP assessed after Nustep activity. 109/65 pt asympomatic. Transferred to w/c with minA and no AD and then focused remainder of session on gait training, strengthening, and endurance. Gait training in rehab gym, ambulating ~1256f+ 7574fith CGA and RW. Improved safety awareness noted compared to previous sessions. Reassessed BP per his request, reqding 108/65. Instructed in alternating toe taps with 4# ankle weights attached, using RW for support, completed x2 minutes with CGA for safety. Upgraded task with 6inch step using 2 hand rails, completed x1 minute with CGA and pt c/o BLE fatigue impacting ability. He also reporting need for urgent toileting. Transported in w/c for urgency and assisted onto toilet with minA stand pivot transfer with cueing for safety. Pt incontinent of bowel and continent of further bowel once on the toilet. NT notified for direct hand off due to time  constraints. Pt on toilet at end of session, all needs met. ? ? ? ?Therapy Documentation ?Precautions:  ?Precautions ?Precautions: Fall ?Restrictions ?Weight Bearing Restrictions: No ?General: ?  ? ? ? ?Therapy/Group: Individual Therapy ? ?Romond Pipkins P Jaedah Lords ?04/30/2021, 10:42 AM  ?

## 2021-04-30 NOTE — Progress Notes (Signed)
Patient ID: [name]., male   DOB: 12-14-1970, 51 y.o.   MRN: [id number] ? ?S: seen in room , still some low BP's when standing. Feeling better overall.  ? ? ?O:BP (!) 146/90   Pulse 65   Temp (!) 97.5 ?F (36.4 ?C)   Resp 12   Ht 6\' 3"  (1.905 m)   Wt 78.2 kg   SpO2 99%   BMI 21.55 kg/m?  ? ?Intake/Output Summary (Last 24 hours) at 04/30/2021 1439 ?Last data filed at 04/30/2021 0800 ?Gross per 24 hour  ?Intake 480 ml  ?Output --  ?Net 480 ml  ? ? ?Intake/Output: ?I/O last 3 completed shifts: ?In: 720 [P.O.:720] ?Out: -  ? Intake/Output this shift: ? Total I/O ?In: 240 [P.O.:240] ?Out: -  ?Weight change: -0.1 kg ?Gen:NAD ?CVS: RRR ?Resp:CTA ?Abd: +BS, soft, NT/ND ?Ext: no edema ?ACCESS: R IJ TDC, R nonfunctioning AVF ?Neuro: L eye closed, L sided weakness ? ?OP HD: [facility] ? 3.5h   400/800   87kg  2/2.5 bath  R AVF/ LIJ TDC  Heparin 1800 + 400u/hr ? - COVID neg 2/28 ? - Hep B SAg neg w/ +Abs on 3/03 ? - Micera 150 mcg IVP every 2 weeks, Venofer 50 mg IV TTS ?- calcitriol 0.5 mcg TTS ?  ?Assessment/Plan: ? Acute bilateral watershed stroke - MRA without obstruction of vessels.  Likely due to hemodynamic changes with HD as he and his wife report frequent drops of BP with HD, however after discussion with his home unit, he did not have any drop in BP during HD and had syncope after HD was completed.  Neuro following.  ? Cdif diarrhea - getting po vanc qid. Per pmd.  ?ESRD -  HD TTS schedule  Cannot dose with midodrine before HD due to prolonged QT interval. My colleague discussed with he and his wife about possible transition to PD at some time in the future if needed (defer to pt's primary nephrologist). No heparin with HD, pt is on eliquis.  Will try to maintain BP in HD.  HD tomorrow.   ? Volume depletion- 9 kg under dry wt, Cdif infection, poor po intake. Feeling better. Continue no fluid off w/ HD. Will bolus 1 liter NS today over 6 hrs.  ? Anemia  - stable-  hgb over 11-  no meds  ?  Metabolic bone disease -  noncompliant with meds. Supposed to be on phoslo and calcitriol ? AFib - hx of, on eliquis ? DM2 - per pmd ?Nutrition - renal diet, carb modified. ?Disposition - in CIR ? ? ?Kelly Splinter, MD ?04/28/2021, 3:55 PM ? ? ? ? ?Recent Labs  ?Lab 04/28/21 ?3662 04/30/21 ?0827  ?HGB 12.3* 11.9*  ?ALBUMIN 2.9* 3.0*  ?CALCIUM 8.7* 8.8*  ?PHOS 8.6* 6.8*  ?CREATININE 12.99* 11.17*  ?K 4.4 4.2  ?  ?Medications ? amiodarone  200 mg Oral Daily  ? apixaban  5 mg Oral BID  ? artificial tears   Left Eye QID  ? atorvastatin  80 mg Oral Daily  ? calcitRIOL  0.5 mcg Oral Q T,Th,Sa-HD  ? calcium acetate  1,334 mg Oral TID WC  ? cholestyramine  4 g Oral TID  ? clopidogrel  75 mg Oral Daily  ? dorzolamide-timolol  1 drop Right Eye BID  ? hydrocortisone cream   Topical BID  ? lactobacillus  1 g Oral TID WC  ? methocarbamol  500 mg Oral TID  ? metoprolol tartrate  25 mg  Oral TID  ? multivitamin  1 tablet Oral QHS  ? multivitamin with minerals  1 tablet Oral Daily  ? polycarbophil  625 mg Oral BID  ? prednisoLONE acetate  1 drop Left Eye Q4H  ? vancomycin  125 mg Oral QID  ? ? ? ? ? ? ? ? ? ? ?

## 2021-05-01 LAB — CBC
HCT: 36.9 % — ABNORMAL LOW (ref 39.0–52.0)
Hemoglobin: 10.8 g/dL — ABNORMAL LOW (ref 13.0–17.0)
MCH: 22.9 pg — ABNORMAL LOW (ref 26.0–34.0)
MCHC: 29.3 g/dL — ABNORMAL LOW (ref 30.0–36.0)
MCV: 78.3 fL — ABNORMAL LOW (ref 80.0–100.0)
Platelets: 262 10*3/uL (ref 150–400)
RBC: 4.71 MIL/uL (ref 4.22–5.81)
RDW: 18.3 % — ABNORMAL HIGH (ref 11.5–15.5)
WBC: 9.4 10*3/uL (ref 4.0–10.5)
nRBC: 0 % (ref 0.0–0.2)

## 2021-05-01 LAB — RENAL FUNCTION PANEL
Albumin: 2.8 g/dL — ABNORMAL LOW (ref 3.5–5.0)
Anion gap: 10 (ref 5–15)
BUN: 40 mg/dL — ABNORMAL HIGH (ref 6–20)
CO2: 21 mmol/L — ABNORMAL LOW (ref 22–32)
Calcium: 8.5 mg/dL — ABNORMAL LOW (ref 8.9–10.3)
Chloride: 107 mmol/L (ref 98–111)
Creatinine, Ser: 9.04 mg/dL — ABNORMAL HIGH (ref 0.61–1.24)
GFR, Estimated: 6 mL/min — ABNORMAL LOW (ref >60)
Glucose, Bld: 164 mg/dL — ABNORMAL HIGH (ref 70–99)
Phosphorus: 4.6 mg/dL (ref 2.5–4.6)
Potassium: 3.7 mmol/L (ref 3.5–5.1)
Sodium: 138 mmol/L (ref 135–145)

## 2021-05-01 MED ORDER — MELATONIN 5 MG PO TABS: 5.0000 mg | ORAL_TABLET | Freq: Every evening | ORAL | Status: DC | PRN

## 2021-05-01 MED ORDER — SODIUM CHLORIDE 0.9 % IV BOLUS
2000.0000 mL | Freq: Once | INTRAVENOUS | Status: AC
  Administered 2021-05-01: 1000 mL via INTRAVENOUS

## 2021-05-01 MED ORDER — METOPROLOL TARTRATE 25 MG PO TABS
25.0000 mg | ORAL_TABLET | Freq: Two times a day (BID) | ORAL | Status: DC
Start: 2021-05-01 — End: 2021-05-04
  Administered 2021-05-01 – 2021-05-04 (×6): 25 mg via ORAL
  Filled 2021-05-01 (×6): qty 1

## 2021-05-01 NOTE — Progress Notes (Signed)
Physical Therapy Session Note ? ?Patient Details  ?Name: [name]. ?MRN: [id number] ?Date of Birth: [birth year] ? ?Today's Date: 05/01/2021 ?PT Individual Time: 1635-1700 ?PT Individual Time Calculation (min): 25 min  ? ?Short Term Goals: ?Week 1:  PT Short Term Goal 1 (Week 1): pt will perform bed mobility with min assist ?PT Short Term Goal 2 (Week 1): Pt will ambulate 64f with min assist ?PT Short Term Goal 3 (Week 1): [facility] ?PT Short Term Goal 4 (Week 1): Pt will perform TUG in < 1 minute ? ?Skilled Therapeutic Interventions/Progress Updates:  ? ?Pt received supine in bed and agreeable to PT. Supine>sit transfer with supervision assist and cues for safety for IV line management. PT applied abdominal binder in sitting. PT instructed pt in modified [place] balance program. Hip abduction, HS curls, mini squats, sit<>stand each performed x 10 BLE, tandem stance 2x 10 sec with cues for improved ROM and posture.  ?Sit>supine completed with supervision assist, and left supine in bed with call bell in reach and all needs met.  ?  ?   ? ?Therapy Documentation ?Precautions:  ?Precautions ?Precautions: Fall ?Restrictions ?Weight Bearing Restrictions: No ? ?  ?Vital Signs: ?Therapy Vitals ?Temp: 98.8 ?F (37.1 ?C) ?Pulse Rate: 73 ?Resp: 15 ?BP: (!) 167/80 ?Patient Position (if appropriate): Lying ?Oxygen Therapy ?SpO2: 99 % ?O2 Device: Room Air ?Pain: ?denies ? ? ? ?Therapy/Group: Individual Therapy ? ?ALorie Phenix?05/01/2021, 5:08 PM  ?

## 2021-05-01 NOTE — Progress Notes (Addendum)
Patient ID: [name]., male   DOB: 08/23/1970, 51 y.o.   MRN: [id number]  Team feels will be ready for discharge on Monday 3/13. Pt asked to be discharged on this date. Will work on discharge needs-according to team has all equipment from past admits. No preference regarding home health, has never had before. ? ?1:37 PM referral accepted by [facility]  ?

## 2021-05-01 NOTE — Plan of Care (Signed)
?  Problem: RH BOWEL ELIMINATION ?Goal: RH STG MANAGE BOWEL WITH ASSISTANCE ?Description: STG Manage Bowel with mod I  Assistance. ?Outcome: Not Progressing; cdiff ?  ?

## 2021-05-01 NOTE — Progress Notes (Signed)
?                                                       PROGRESS NOTE ? ? ?Subjective/Complaints: ?Pt would like to move up D/C date to allow him to see surgeon for parathyroidectomy on 3/15 ?5 liquid stools last noc tired a lot  ?Still with peri anal pain, discussed pain med use (only to 5mg  Oxy IR yesterday ) ? ?ROS: Patient denies CP, SOB, N/V/D ? ?Objective: ?  ?No results found. ?Recent Labs  ?  04/30/21 ?0827  ?WBC 8.8  ?HGB 11.9*  ?HCT 39.4  ?PLT 278  ? ? ?Recent Labs  ?  04/30/21 ?0827  ?NA 139  ?K 4.2  ?CL 105  ?CO2 21*  ?GLUCOSE 100*  ?BUN 53*  ?CREATININE 11.17*  ?CALCIUM 8.8*  ? ? ? ?Intake/Output Summary (Last 24 hours) at 05/01/2021 0727 ?Last data filed at 04/30/2021 1821 ?Gross per 24 hour  ?Intake 480 ml  ?Output --  ?Net 480 ml  ? ?  ? ?  ? ?Physical Exam: ?Vital Signs ?Blood pressure 138/75, pulse 65, temperature 98.6 ?F (37 ?C), resp. rate 18, height 6\' 3"  (1.905 m), weight 78.2 kg, SpO2 98 %. ? ? ? ?General: No acute distress ?Mood and affect are appropriate ?Heart: Regular rate and rhythm no rubs murmurs or extra sounds ?Lungs: Clear to auscultation, breathing unlabored, no rales or wheezes ?Abdomen: Positive bowel sounds, soft nontender to palpation, nondistended ?Extremities: No clubbing, cyanosis, or edema ?Skin: No evidence of breakdown, no evidence of rash, IV site ok  ? ?Musculoskeletal: Full range of motion in all 4 extremities. No joint swelling ? ?Neuro:  alert and oriented x 3. Left facial droop. Speech generally clear. LUE 1+/5 deltoid to 3-/5 bicep, tricep, wrist/hand. LLE 3+ to 4-/5. RUE and RLE 5/5. Senses pain and light touch in all 4's. No resting tone--stable exam  ?Musculoskeletal: Reduced active , PROM Left shoulder  ? ? ?Assessment/Plan: ?1. Functional deficits which require 3+ hours per day of interdisciplinary therapy in a comprehensive inpatient rehab setting. ?Physiatrist is providing close team supervision and 24 hour management of active medical problems listed  below. ?Physiatrist and rehab team continue to assess barriers to discharge/monitor patient progress toward functional and medical goals ? ?Care Tool: ? ?Bathing ?   ?Body parts bathed by patient: Left arm, Right upper leg, Left upper leg, Face, Right arm, Chest, Abdomen  ? Body parts bathed by helper: Buttocks, Right lower leg, Left lower leg ?  ?  ?Bathing assist Assist Level: Moderate Assistance - Patient 50 - 74% ?  ?  ?Upper Body Dressing/Undressing ?Upper body dressing Upper body dressing/undressing activity did not occur (including orthotics): Safety/medical concerns ?What is the patient wearing?: [place] only ?   ?Upper body assist Assist Level: Moderate Assistance - Patient 50 - 74% ?   ?Lower Body Dressing/Undressing ?Lower body dressing ? ? ? Lower body dressing activity did not occur: Safety/medical concerns ?What is the patient wearing?: Pants ? ?  ? ?Lower body assist Assist for lower body dressing: Moderate Assistance - Patient 50 - 74% ?   ? ?Toileting ?Toileting    ?Toileting assist Assist for toileting: Maximal Assistance - Patient 25 - 49% ?  ?  ?Transfers ?Chair/bed transfer ? ?Transfers assist ? Chair/bed transfer activity did  not occur: Safety/medical concerns ? ?Chair/bed transfer assist level: Minimal Assistance - Patient > 75% ?  ?  ?Locomotion ?Ambulation ? ? ?Ambulation assist ? ?   ? ?Assist level: Minimal Assistance - Patient > 75% ?Assistive device: Walker-rolling ?Max distance: 175 ft  ? ?Walk 10 feet activity ? ? ?Assist ?   ? ?Assist level: Contact Guard/Touching assist ?Assistive device: Walker-rolling  ? ?Walk 50 feet activity ? ? ?Assist   ? ?Assist level: Minimal Assistance - Patient > 75% ?Assistive device: Walker-rolling  ? ? ?Walk 150 feet activity ? ? ?Assist Walk 150 feet activity did not occur: Safety/medical concerns ? ?Assist level: Minimal Assistance - Patient > 75% ?Assistive device: Walker-rolling ?  ? ?Walk 10 feet on uneven surface  ?activity ? ? ?Assist Walk  10 feet on uneven surfaces activity did not occur: Safety/medical concerns ? ? ?  ?   ? ?Wheelchair ? ? ? ? ?Assist Is the patient using a wheelchair?: Yes ?Type of Wheelchair: Manual ?  ? ?Wheelchair assist level: Dependent - Patient 0% ?Max wheelchair distance: 150  ? ? ?Wheelchair 50 feet with 2 turns activity ? ? ? ?Assist ? ?  ?  ? ? ?Assist Level: Dependent - Patient 0%  ? ?Wheelchair 150 feet activity  ? ? ? ?Assist ?   ? ? ?Assist Level: Dependent - Patient 0%  ? ?Blood pressure 138/75, pulse 65, temperature 98.6 ?F (37 ?C), resp. rate 18, height 6\' 3"  (1.905 m), weight 78.2 kg, SpO2 98 %. ? ?Medical Problem List and Plan: ?1. Functional deficits secondary to cerebral ischemic stroke, bilateral due to global hypoperfusion ?            -patient may shower ?            -ELOS/Goals: 3/13 , pt has a ENT office consult for parathyroidectomy on 3/15 which has been difficult to schedule  and does not want to miss,  ?Need to D/C on non HD day  ?            -Continue CIR therapies including PT, OT, and SLP   ?2.  Antithrombotics: ?-DVT/anticoagulation:  Pharmaceutical:  -resumed Eliquis ?            -antiplatelet therapy: Resume Plavix ?3. Pain Management: Tylenol prn.  ?4. Mood: LCSW to follow for evaluation and support.  ?            -antipsychotic agents: NA ?5. Neuropsych: This patient may be capable of making decisions on his own behalf. ?6. Skin/Wound Care:  Routine pressure relief measures.  ?7. Fluids/Electrolytes/Nutrition: Strict I/O --not on any fluid restriction at this time.  ?8. Hypertension: Monitor for orthostatic/syncopal episodes with increase in activity. ?            --continue metoprolol tid  ?  ?Vitals:  ? 04/30/21 1930 05/01/21 0316  ?BP: (!) 159/85 138/75  ?Pulse: 69 65  ?Resp: 17 18  ?Temp: 98.7 ?F (37.1 ?C) 98.6 ?F (37 ?C)  ?SpO2: 99% 98%  ?Controlled 3/10 ? ?9. CAF: Monitor HR TID ?10. T2DM w/neuropathy and gastroparesis: Not on any meds as A1C normalized-4.7.  ?            --BS have been  well controlled.--can dc cbg's and SSI  .   ?11. H/o GERD w/gastritis/esophagitis: ?48. ESRD: Continue HD TTS at the end of the day to help with tolerance of therapy. ?            --  on phoslo and calcitriol for metabolic bone disese. ?            --not in favor of transitioning to PD. Plans for graft in LUE? ?Messaged Nephro, regarding diarrhea, fluid loss, potential for Hypo K+  ?BMP Latest Ref Rng & Units 04/30/2021 04/28/2021 04/25/2021  ?Glucose 70 - 99 mg/dL 100(H) 79 100(H)  ?BUN 6 - 20 mg/dL 53(H) 75(H) 57(H)  ?Creatinine 0.61 - 1.24 mg/dL 11.17(H) 12.99(H) 10.18(H)  ?Sodium 135 - 145 mmol/L 139 134(L) 136  ?Potassium 3.5 - 5.1 mmol/L 4.2 4.4 4.6  ?Chloride 98 - 111 mmol/L 105 103 102  ?CO2 22 - 32 mmol/L 21(L) 14(L) 18(L)  ?Calcium 8.9 - 10.3 mg/dL 8.8(L) 8.7(L) 8.9  ?  ?13. Anemia of chronic disease: Stable w/ Hgb @ 11.5 on 03/02. Repeat Hgb tomorrow.  ?14. Glaucoma:s/p Left orbital implant Resume cosopt eye drops.  ?13. Diarrhea: persistent,  ?  ?+ C diff on Vanc  ?-added fiber for bulking.  ?-Increase cholestyramine to TID  ?-on no scheduled softeners or laxatives ?  ? ?LOS: ?7 days ?A FACE TO FACE EVALUATION WAS PERFORMED ? ?[name] ?05/01/2021, 7:27 AM  ? ?  ?

## 2021-05-01 NOTE — Progress Notes (Signed)
Orthopedic Tech Progress Note ?Patient Details:  ?[name]. ?May 22, 1970 ?[id number] ? ?RN called requesting an ABDOMINAL BINDER for patient  ? ?Ortho Devices ?Ortho Device/Splint Location: STOMACH ?Ortho Device/Splint Interventions: Ordered ?  ?Post Interventions ?Patient Tolerated: Well ?Instructions Provided: Care of device ? ?Janit Pagan ?05/01/2021, 9:46 AM ? ?

## 2021-05-01 NOTE — Discharge Instructions (Addendum)
Inpatient Rehab Discharge Instructions ? ?Chason [name]. ?Discharge date and time: 05/04/21  ? ?Activities/Precautions/ Functional Status: ?Activity: no lifting, driving, or strenuous exercise till cleared by MD ?Diet: renal diet limit fluids to 1200 cc/day ?Wound Care: none needed ? ?Functional status:  ?___ No restrictions     ___ Walk up steps independently ?_X__ 24/7 supervision/assistance   ___ Walk up steps with assistance ?___ Intermittent supervision/assistance  ___ Bathe/dress independently ?___ Walk with walker     _X__ Bathe/dress with assistance ?___ Walk Independently    ___ Shower independently ?___ Walk with assistance    ___ Shower with assistance ?_X__ No alcohol     ___ Return to work/school ________ ? ? ?Special Instructions: ?Take all blood pressure seated and wear support stocking when out of bed.  ?Note that you drop your blood pressure when standing and need to take time with positional changes ?Need to follow up this week with your primary care for BP check and further input on BP medications.  ? ? ?COMMUNITY REFERRALS UPON DISCHARGE:   ? ?Home Health:   PT & AIDE   ?               [place] Phone: [phone]  ? ?Medical Equipment/Items Ordered: HAS ALL NEEDED EQUIPMENT FROM PAST ADMISSIONS ?                                                Agency/Supplier:NA ? ? ?My questions have been answered and I understand these instructions. I will adhere to these goals and the provided educational materials after my discharge from the hospital. ? ?Patient/Caregiver Signature _______________________________ Date __________ ? ?Clinician Signature _______________________________________ Date __________ ? ?Please bring this form and your medication list with you to all your follow-up doctor's appointments.   ? ? ?

## 2021-05-01 NOTE — Progress Notes (Signed)
Physical Therapy Session Note ? ?Patient Details  ?Name: [name]. ?MRN: [id number] ?Date of Birth: [birth year] ? ?Today's Date: 05/01/2021 ?PT Individual Time: 702 651 1632 ?PT Individual Time Calculation (min): 68 min  ? ?Short Term Goals: ?Week 1:  PT Short Term Goal 1 (Week 1): pt will perform bed mobility with min assist ?PT Short Term Goal 2 (Week 1): Pt will ambulate 79f with min assist ?PT Short Term Goal 3 (Week 1): [facility] ?PT Short Term Goal 4 (Week 1): Pt will perform TUG in < 1 minute ? ? ?Skilled Therapeutic Interventions/Progress Updates:  ? ?Pt received supine in bed and agreeable to PT. PT donned knee high Teds while PT supine in bed. Supine>sit transfer with supervision assist and cues for decreased use of bed rails as able. [facility] supervision assist from PT with cues for AD management.  ? ?[facility] x 2021fwith CGA-supervision assist for clothing management only. Pt performed additional gait training with SPC in the LLE as pt utilized premorbid x 1566fith min assist and poor coordination on the LUE and mild lateral LOB requiring assist to prevent fall. 79f52fth SPC in RUE with CGA for safety from PT. Pt reports mild lightheadedness. Orthostatic VS assessed. Sitting. 88/61, standing 81/50, mild orthostatic s/s. Sitting 102/61  ? ?Pt returned to room. Dynamic gait training with RW forward/reverse 2 x 10ft40f side stepping 2 x 10  each. Performed with HHA on the LUE side stepping 2x10ft 30fforward/reverse 2x10 each.  Min assist for safety without AD.  ? ?PT performed additional Orthostatic VS.  ?Standing: 80/50. Mild S/S ?Sitting. 94/57 ?Supine. 121/70. RN notified ? ?Pt returned to room and performed ambulatory transfer to bed with RW and supervision assist. Sit>supine completed with supervision assist. ?Supine NMR: SLR, x 12 BLE, hip abduction/adduction x 12 BLE, heel slides x 12  BLE. Cues for full ROM on the LLE and sustained hold at end range.  ?  ?Left supine in bed with call bell in reach and all needs met.  ? ? ?   ? ?Therapy Documentation ?Precautions:  ?Precautions ?Precautions: Fall ?Restrictions ?Weight Bearing Restrictions: No ? ?Vital Signs: ?Therapy Vitals ?BP:  (80/50 standing per PT) ?Pain: ?denies ? ? ? ?Therapy/Group: Individual Therapy ? ?AustinLorie Phenix/2023, 9:38 AM  ?

## 2021-05-01 NOTE — Progress Notes (Signed)
Occupational Therapy Session Note ? ?Patient Details  ?Name: [name]. ?MRN: [id number] ?Date of Birth: 1971/02/19 ? ?Today's Date: 05/01/2021 ?OT Individual Time: 0165-8006 ?OT Individual Time Calculation (min): 70 min  ? ? ?Short Term Goals: ?Week 1:  OT Short Term Goal 1 (Week 1): Pt will bathe UB with no A except to facilitate LUE To wash RUE + min cuing ?OT Short Term Goal 2 (Week 1): Pt will completes oral care wiht MOD HOH A ?OT Short Term Goal 3 (Week 1): Pt will recall hemi dressing techniques wiht MIN VC ?OT Short Term Goal 4 (Week 1): Pt will transfer to toilet wiht MIN A ? ?Skilled Therapeutic Interventions/Progress Updates:  ?  PT orthostatic throughout. Pt with BP trending downward beginning session at 140/79 and last BP in dayroom during bowling 80/56 with teds and abdominal binder on. Pt returned to room in supine. Overall pt only symptomatic in standing towards end of session.  ? ?Pt received in bed with no pain reported. ? ?ADL: ?Pt completes ADL at overall CGA Level. Skilled interventions include: increased time to take orthostatic vitals and apply compressive devices. Pt compressive devices applied but BP continues to be variable throughout session. Pt with continent bowel void of liquid stool on BSC over toilet with VC for RW management. Pt needs incresed time for full void and increased time to wipe in standing crouched position with LUE reaching posteriorly. OT applies Hemorid cream to buttocks in sidelying. Seated hand hygiene with increased time ? ?Therapeutic activity ?Attempted seated and standing Wii bowling with increased time to problem solve and motor plan with LUE to use remote. Pt attempts 1 frame in standing, however reports dizziness and OT facilitates stand>sit with MIN A. BP 80/56. Returned to room in bed in supine an dBP 127/76 edu re orthostatic hypotension.  ? ?Pt left at end of session in bed with exit alarm on, call light in reach and all needs met ? ? ?Therapy  Documentation ?Precautions:  ?Precautions ?Precautions: Fall ?Restrictions ?Weight Bearing Restrictions: No ?General: ?  ? ? ?Therapy/Group: Individual Therapy ? ?[name] ?05/01/2021, 6:51 AM ?

## 2021-05-01 NOTE — Progress Notes (Signed)
Patient ID: [name]., male   DOB: [birth year], 51 y.o.   MRN: [id number] ? ?S: seen in room , still orthostatic BP drops to the 80's ? ?O:BP 138/75 (BP Location: Left Arm)   Pulse 65   Temp 98.6 ?F (37 ?C)   Resp 18   Ht 6\' 3"  (1.905 m)   Wt 78.2 kg   SpO2 98%   BMI 21.55 kg/m?  ? ?Intake/Output Summary (Last 24 hours) at 05/01/2021 1046 ?Last data filed at 04/30/2021 1821 ?Gross per 24 hour  ?Intake 240 ml  ?Output --  ?Net 240 ml  ? ? ?Intake/Output: ?I/O last 3 completed shifts: ?In: 480 [P.O.:480] ?Out: -  ? Intake/Output this shift: ? No intake/output data recorded. ?Weight change: 0 kg ?Gen:NAD ?CVS: RRR ?Resp:CTA ?Abd: +BS, soft, NT/ND ?Ext: no edema ?ACCESS: R IJ TDC, R nonfunctioning AVF ?Neuro: L eye closed, L sided weakness ? ?OP HD: [facility] ? 3.5h   400/800   87kg  2/2.5 bath  R AVF/ LIJ TDC  Heparin 1800 + 400u/hr ? - Hep B SAg neg w/ +Abs on 3/03 ? - Micera 150 mcg IVP every 2 weeks, Venofer 50 mg IV TTS ?- calcitriol 0.5 mcg TTS ?  ?Assessment/Plan: ? Acute bilateral watershed stroke - MRA without obstruction of vessels.  Likely due to hemodynamic changes with HD as he and his wife report frequent drops of BP with HD, however after discussion with his home unit, he did not have any drop in BP during HD and had syncope after HD was completed.  Neuro following.  ? Cdif diarrhea - getting po vanc qid. Per pmd. Improving.  ?ESRD -  HD TTS schedule  Cannot dose with midodrine before HD due to prolonged QT interval. My colleague discussed with he and his wife about possible transition to PD at some time in the future if needed (defer to pt's primary nephrologist). No heparin with HD, pt is on eliquis.  Will try to maintain BP in HD.  HD Saturday. ? Volume depletion- 9 kg under dry wt, Cdif infection, poor po intake. Still orthostatic, will cont no fluid off w/ HD and will bolus 2 L NS overnight from 4p - 7a and give another 1 L w/ HD tomorrow.  ? Anemia  - stable-  hgb over 11-  no  meds  ? Metabolic bone disease -  noncompliant with meds. Supposed to be on phoslo and calcitriol ? AFib - hx of, on eliquis ? DM2 - per pmd ?Nutrition - renal diet, carb modified. ?Disposition - in CIR ? ? ?Kelly Splinter, MD ?04/28/2021, 3:55 PM ? ? ? ? ?Recent Labs  ?Lab 04/28/21 ?2706 04/30/21 ?0827  ?HGB 12.3* 11.9*  ?ALBUMIN 2.9* 3.0*  ?CALCIUM 8.7* 8.8*  ?PHOS 8.6* 6.8*  ?CREATININE 12.99* 11.17*  ?K 4.4 4.2  ? ?  ?Medications ? amiodarone  200 mg Oral Daily  ? apixaban  5 mg Oral BID  ? artificial tears   Left Eye QID  ? atorvastatin  80 mg Oral Daily  ? calcitRIOL  0.5 mcg Oral Q T,Th,Sa-HD  ? calcium acetate  1,334 mg Oral TID WC  ? cholestyramine  4 g Oral TID  ? clopidogrel  75 mg Oral Daily  ? dorzolamide-timolol  1 drop Right Eye BID  ? hydrocortisone cream   Topical BID  ? lactobacillus  1 g Oral TID WC  ? metoprolol tartrate  25 mg Oral BID  ? multivitamin  1 tablet  Oral QHS  ? multivitamin with minerals  1 tablet Oral Daily  ? polycarbophil  625 mg Oral BID  ? prednisoLONE acetate  1 drop Left Eye Q4H  ? vancomycin  125 mg Oral QID  ? ? ? ? ? ? ? ? ? ? ?

## 2021-05-02 LAB — CBC
HCT: 37.2 % — ABNORMAL LOW (ref 39.0–52.0)
Hemoglobin: 11.3 g/dL — ABNORMAL LOW (ref 13.0–17.0)
MCH: 23.7 pg — ABNORMAL LOW (ref 26.0–34.0)
MCHC: 30.4 g/dL (ref 30.0–36.0)
MCV: 78 fL — ABNORMAL LOW (ref 80.0–100.0)
Platelets: 261 10*3/uL (ref 150–400)
RBC: 4.77 MIL/uL (ref 4.22–5.81)
RDW: 17.9 % — ABNORMAL HIGH (ref 11.5–15.5)
WBC: 10.3 10*3/uL (ref 4.0–10.5)
nRBC: 0 % (ref 0.0–0.2)

## 2021-05-02 LAB — RENAL FUNCTION PANEL
Albumin: 2.7 g/dL — ABNORMAL LOW (ref 3.5–5.0)
Anion gap: 11 (ref 5–15)
BUN: 50 mg/dL — ABNORMAL HIGH (ref 6–20)
CO2: 18 mmol/L — ABNORMAL LOW (ref 22–32)
Calcium: 8.8 mg/dL — ABNORMAL LOW (ref 8.9–10.3)
Chloride: 110 mmol/L (ref 98–111)
Creatinine, Ser: 10.24 mg/dL — ABNORMAL HIGH (ref 0.61–1.24)
GFR, Estimated: 6 mL/min — ABNORMAL LOW (ref >60)
Glucose, Bld: 93 mg/dL (ref 70–99)
Phosphorus: 5.6 mg/dL — ABNORMAL HIGH (ref 2.5–4.6)
Potassium: 4 mmol/L (ref 3.5–5.1)
Sodium: 139 mmol/L (ref 135–145)

## 2021-05-02 MED ORDER — HEPARIN SODIUM (PORCINE) 1000 UNIT/ML DIALYSIS: 2000.0000 [IU] | INTRAMUSCULAR | Status: DC | PRN

## 2021-05-02 MED ORDER — CLONIDINE HCL 0.1 MG PO TABS
0.1000 mg | ORAL_TABLET | Freq: Three times a day (TID) | ORAL | Status: DC | PRN
  Administered 2021-05-02 – 2021-05-03 (×2): 0.1 mg via ORAL
  Filled 2021-05-02 (×4): qty 1

## 2021-05-02 NOTE — Progress Notes (Signed)
1066mls positive fluid balance as ordered.  pre bp 182/97 post bp 181/94 pre weight 81.6kg post weight 82.5kg bed scales.  catheter ran well heparin packed clamped and capped. dressing changed to left chest.  calcitriol and vancomycin given as ordered. ?

## 2021-05-02 NOTE — Progress Notes (Signed)
Patient's BP 187/89pulse 68. Metoprolol given prior to 8am. MD notified.  ?

## 2021-05-02 NOTE — Progress Notes (Signed)
Patient ID: [name]., male   DOB: [birth year], 51 y.o.   MRN: [id number] ? ?S: seen on HD. Wts up to 81kg.  ? ?O:BP (!) 177/91   Pulse 61   Temp 97.7 ?F (36.5 ?C) (Temporal)   Resp 14   Ht 6\' 3"  (1.905 m)   Wt 81.6 kg   SpO2 100%   BMI 22.49 kg/m?  ?No intake or output data in the 24 hours ending 05/02/21 1336 ? ?Intake/Output: ?I/O last 3 completed shifts: ?In: 118 [P.O.:118] ?Out: -  ? Intake/Output this shift: ? No intake/output data recorded. ?Weight change: 3 kg ?Gen:NAD ?CVS: RRR ?Resp:CTA ?Abd: +BS, soft, NT/ND ?Ext: no edema ?ACCESS: R IJ TDC, R nonfunctioning AVF ?Neuro: L eye closed, L sided weakness ? ?OP HD: [facility] ? 3.5h   400/800   87kg  2/2.5 bath  R AVF/ LIJ TDC  Heparin 1800 + 400u/hr ? - Hep B SAg neg w/ +Abs on 3/03 ? - Micera 150 mcg IVP every 2 weeks, Venofer 50 mg IV TTS ?- calcitriol 0.5 mcg TTS ?  ?Assessment/Plan: ? Acute bilateral watershed stroke - MRA without obstruction of vessels.  Likely due to hemodynamic changes with HD as he and his wife report frequent drops of BP with HD, however after discussion with his home unit, he did not have any drop in BP during HD and had syncope after HD was completed.  Neuro following.  ? Cdif diarrhea - getting po vanc qid. Per pmd. Improving.  ?ESRD -  HD TTS schedule  Cannot dose with midodrine before HD due to prolonged QT interval. My colleague discussed with the pt and his wife about possible transition to PD at some time in the future if needed (defer to pt's primary nephrologist). No heparin with HD, pt is on eliquis.  Will try to maintain BP in HD.  HD Saturday. ? Volume depletion- 6kg under dry wt which is an improvement.  Cdif infection, poor po intake. Got 2 L yesterday bolus and will get 1 L bolus today.  ? Anemia  - stable-  hgb over 11-  no meds  ? Metabolic bone disease -  noncompliant with meds. Supposed to be on phoslo and calcitriol ? AFib - hx of, on eliquis ? DM2 - per pmd ?Nutrition - renal diet, carb  modified. ?Disposition - in CIR ? ? ?Kelly Splinter, MD ?04/28/2021, 3:55 PM ? ? ? ? ?Recent Labs  ?Lab 05/01/21 ?1641 05/02/21 ?1139  ?HGB 10.8* 11.3*  ?ALBUMIN 2.8* 2.7*  ?CALCIUM 8.5* 8.8*  ?PHOS 4.6 5.6*  ?CREATININE 9.04* 10.24*  ?K 3.7 4.0  ? ?  ?Medications ? amiodarone  200 mg Oral Daily  ? apixaban  5 mg Oral BID  ? artificial tears   Left Eye QID  ? atorvastatin  80 mg Oral Daily  ? calcitRIOL  0.5 mcg Oral Q T,Th,Sa-HD  ? calcium acetate  1,334 mg Oral TID WC  ? cholestyramine  4 g Oral TID  ? clopidogrel  75 mg Oral Daily  ? dorzolamide-timolol  1 drop Right Eye BID  ? hydrocortisone cream   Topical BID  ? lactobacillus  1 g Oral TID WC  ? metoprolol tartrate  25 mg Oral BID  ? multivitamin  1 tablet Oral QHS  ? multivitamin with minerals  1 tablet Oral Daily  ? polycarbophil  625 mg Oral BID  ? prednisoLONE acetate  1 drop Left Eye Q4H  ? vancomycin  125 mg  Oral QID  ? ? ? ? ? ? ? ? ? ? ?

## 2021-05-02 NOTE — Progress Notes (Signed)
Physical Therapy Session Note ? ?Patient Details  ?Name: [name]. ?MRN: [id number] ?Date of Birth: 1970-09-29 ? ?Today's Date: 05/02/2021 ?PT Individual Time: 0900-1008 ?PT Individual Time Calculation (min): 68 min  ? ?Short Term Goals: ?Week 1:  PT Short Term Goal 1 (Week 1): pt will perform bed mobility with min assist ?PT Short Term Goal 2 (Week 1): Pt will ambulate 24f with min assist ?PT Short Term Goal 3 (Week 1): [facility] ?PT Short Term Goal 4 (Week 1): Pt will perform TUG in < 1 minute ? ? ?Skilled Therapeutic Interventions/Progress Updates:  ? ?Pt received supine in bed and agreeable to PT. Supine>sit transfer with supervision assist with cues for improved trunkal rotation to the R.  ? ?Pt performed upper and lower body dressing from EOB. Min assist for clothing management on BLE. Facial hygiene and oral hygiene at sink set up assist from PT due to poor coordination and management of tooth brush and tooth paste.   ? ?[facility] x 4 throughout session. All sit<>stand transfers performed with supervision assist from PT.  ? ?Gait training with RW x 1632fwith supervision assist for safety with occasional CGA for clothing management only.   ? ?Patient demonstrates increased fall risk as noted by score of  32 /56 on Berg Balance Scale.  (<36= high risk for falls, close to 100%; 37-45 significant >80%; 46-51 moderate >50%; 52-55 lower >25%) ? ? ?Stair management training with BUE support on rails with CGA for safety x 12 (3x4). Cues for step to gait pattern, but noted to utilize step through on 2nd and 3rd bout. Mild toe drag on the LLE.  ? ?Car transfer training with supervision assist and RW for safety. Cues for use of seat back and dash board to push into standing.  ? ?Ramp ascent/descent to simulate access to home supervision assist with RW. Pt report his ramp at home is steeper than training ramp in  orhtogym.  ? ?Eduction of Modified [place] level A with hand out provided exercises all performed on 3/10.  ? ?Pt returned to room and performed stand pivot transfer to bed with RW and supervision assist. Sit>supine completed without assist, and left supine in bed with call bell in reach and all needs met.  ? ? ? ?   ? ?Therapy Documentation ?Precautions:  ?Precautions ?Precautions: Fall ?Restrictions ?Weight Bearing Restrictions: No ? ? ?Pain: ?Pain Assessment ?Pain Scale: 0-10 ?Pain Score: 0-No pain ? ?   ?Balance: ?Standardized Balance Assessment ?Standardized Balance Assessment: BeMerrilee Janskyalance Test ?BeMerrilee Janskyalance Test ?Sit to Stand: Able to stand  independently using hands ?Standing Unsupported: Able to stand 2 minutes with supervision ?Sitting with Back Unsupported but Feet Supported on Floor or Stool: Able to sit safely and securely 2 minutes ?Stand to Sit: Sits safely with minimal use of hands ?Transfers: Able to transfer safely, definite need of hands ?Standing Unsupported with Eyes Closed: Able to stand 10 seconds with supervision ?Standing Ubsupported with Feet Together: Needs help to attain position but able to stand for 30 seconds with feet together ?From Standing, Reach Forward with Outstretched Arm: Can reach forward >5 cm safely (2") ?From Standing Position, Pick up Object from Floor: Able to pick up shoe, needs supervision ?From Standing Position, Turn to Look Behind Over each Shoulder: Turn sideways only but maintains balance ?Turn 360 Degrees: Needs close supervision or verbal cueing ?Standing Unsupported,  Alternately Place Feet on Step/Stool: Able to complete >2 steps/needs minimal assist ?Standing Unsupported, One Foot in Front: Needs help to step but can hold 15 seconds ?Standing on One Leg: Tries to lift leg/unable to hold 3 seconds but remains standing independently ?Total Score: 32 ? ? ?Therapy/Group: Individual Therapy ? ?Lorie Phenix ?05/02/2021, 10:50 AM  ?

## 2021-05-02 NOTE — Progress Notes (Signed)
?   05/02/21 1635  ?Assess: MEWS Score  ?Temp 98.2 ?F (36.8 ?C)  ?BP (!) 221/93  ?Pulse Rate 66  ?Resp 14  ?SpO2 100 %  ?O2 Device Room Air  ?Assess: MEWS Score  ?MEWS Temp 0  ?MEWS Systolic 2  ?MEWS Pulse 0  ?MEWS RR 0  ?MEWS LOC 0  ?MEWS Score 2  ?MEWS Score Color Yellow  ?Assess: if the MEWS score is Yellow or Red  ?Were vital signs taken at a resting state? Yes  ?Focused Assessment No change from prior assessment  ?Early Detection of Sepsis Score *See Row Information* Low  ?MEWS guidelines implemented *See Row Information* Yes  ?Treat  ?Pain Scale 0-10  ?Pain Score 0  ?Take Vital Signs  ?Increase Vital Sign Frequency  Yellow: Q 2hr X 2 then Q 4hr X 2, if remains yellow, continue Q 4hrs  ?Escalate  ?MEWS: Escalate Yellow: discuss with charge nurse/RN and consider discussing with provider and RRT  ?Notify: Charge Nurse/RN  ?Name of Charge Nurse/RN Notified [place] nida RN  ?Date Charge Nurse/RN Notified 05/02/21  ?Time Charge Nurse/RN Notified 1630  ?Notify: Provider  ?Provider Name/Title Dr. Naaman Plummer  ?Date Provider Notified 05/02/21  ?Time Provider Notified [phone]  ?Notification Type Call  ?Notification Reason Change in status  ?Provider response See new orders  ?Date of Provider Response 05/02/21  ?Time of Provider Response [phone]  ?Document  ?Patient Outcome Stabilized after interventions  ?Progress note created (see row info) Yes  ? ? ?

## 2021-05-02 NOTE — Progress Notes (Signed)
Physical Therapy Discharge Summary ? ?Patient Details  ?Name: [name]. ?MRN: [id number] ?Date of Birth: [birth year] ? ?Today's Date: 05/04/2021 ? ? ?Patient has met 9 of 9 long term goals due to improved activity tolerance, improved balance, improved postural control, increased strength, increased range of motion, ability to compensate for deficits, functional use of  left upper extremity and left lower extremity, improved attention, improved awareness, and improved coordination.  Patient to discharge at an ambulatory level Supervision and RW .   Patient's care partner is independent to provide the necessary physical assistance at discharge. ? ?Reasons goals not met: All PT goals met  ? ?Recommendation:  ?Patient will benefit from ongoing skilled PT services in home health setting to continue to advance safe functional mobility, address ongoing impairments in balance, transfers, safety, and minimize fall risk. ? ?Equipment: ?No equipment provided ? ?Reasons for discharge: treatment goals met and discharge from hospital ? ?Patient/family agrees with progress made and goals achieved: Yes ? ?Discharge information below completed by lead PT on 3/11.  ? ?PT Discharge ?Precautions/Restrictions ?Restrictions ?Weight Bearing Restrictions: No ?Pain Interference ?Pain Interference ?Pain Effect on Sleep: 4. Almost constantly ?Pain Interference with Therapy Activities: 1. Rarely or not at all ?Pain Interference with Day-to-Day Activities: 1. Rarely or not at all ?Vision/Perception  ?Vision - History ?Ability to See in Adequate Light: 2 Moderately impaired ?Vision - Assessment ?Additional Comments: L eye loss. ?Perception ?Perception: Within Functional Limits ?Praxis ?Praxis: Intact  ?Cognition ?Orientation Level: Oriented X4 ?Safety/Judgment: Appears intact ?Sensation ?Sensation ?Light Touch: Appears Intact ?Coordination ?Gross Motor Movements are Fluid and Coordinated: No ?Fine Motor Movements are Fluid and Coordinated:  No ?Coordination and Movement Description: L sided hemiplegia. UE >LE ?Heel Shin Test: [facility] BLE ?Motor  ?Motor ?Motor: Hemiplegia ?Motor - Discharge Observations: L hemiplegia weakness UE>LE; improved from Eval  ?Mobility ?Bed Mobility ?Bed Mobility: Rolling Right;Rolling Left;Supine to Sit;Sit to Supine ?Rolling Right: Supervision/verbal cueing ?Rolling Left: Supervision/Verbal cueing ?Supine to Sit: Supervision/Verbal cueing ?Sit to Supine: Supervision/Verbal cueing ?Transfers ?Transfers: Sit to Stand;Stand to Sit ?Sit to Stand: Supervision/Verbal cueing ?Stand to Sit: Supervision/Verbal cueing ?Stand Pivot Transfers: Supervision/Verbal cueing ?Transfer (Assistive device): Rolling walker ?Locomotion  ?Gait ?Ambulation: Yes ?Gait Assistance: Supervision/Verbal cueing ?Gait Distance (Feet): 160 Feet ?Assistive device: Rolling walker ?Gait Assistance Details: Verbal cues for safe use of DME/AE;Verbal cues for gait pattern ?Gait ?Gait: Yes ?Gait Pattern: Impaired ?Gait Pattern: Narrow base of support ?Stairs / Additional Locomotion ?Stairs: Yes ?Stairs Assistance: Supervision/Verbal cueing;Contact Guard/Touching assist ?Stair Management Technique: Two rails ?Number of Stairs: 12 ?Height of Stairs: 6 ?Wheelchair Mobility ?Wheelchair Mobility: Yes ?Wheelchair Assistance: Contact Guard/Touching assist ?Wheelchair Propulsion: Both upper extremities ?Wheelchair Parts Management: Needs assistance ?Distance: 150  ?Trunk/Postural Assessment  ?Cervical Assessment ?Cervical Assessment: Exceptions to [facility] (head forward) ?Thoracic Assessment ?Thoracic Assessment: Exceptions to [facility] (rounded shoulders) ?Lumbar Assessment ?Lumbar Assessment: Exceptions to [facility] (post pelvic tilt) ?Postural Control ?Postural Control: Within Functional Limits  ?Balance ?Standardized Balance Assessment ?Standardized Balance Assessment: Merrilee Jansky Balance Test ?Merrilee Jansky Balance Test ?Sit to Stand: Able to stand  independently using hands ?Standing Unsupported: Able  to stand 2 minutes with supervision ?Sitting with Back Unsupported but Feet Supported on Floor or Stool: Able to sit safely and securely 2 minutes ?Stand to Sit: Sits safely with minimal use of hands ?Transfers: Able to transfer safely, definite need of hands ?Standing Unsupported with Eyes Closed: Able to stand 10 seconds with supervision ?Standing Ubsupported with Feet Together: Needs help to attain position but able to stand  for 30 seconds with feet together ?From Standing, Reach Forward with Outstretched Arm: Can reach forward >5 cm safely (2") ?From Standing Position, Pick up Object from Floor: Able to pick up shoe, needs supervision ?From Standing Position, Turn to Look Behind Over each Shoulder: Turn sideways only but maintains balance ?Turn 360 Degrees: Needs close supervision or verbal cueing ?Standing Unsupported, Alternately Place Feet on Step/Stool: Able to complete >2 steps/needs minimal assist ?Standing Unsupported, One Foot in Front: Needs help to step but can hold 15 seconds ?Standing on One Leg: Tries to lift leg/unable to hold 3 seconds but remains standing independently ?Total Score: 32 ?Dynamic Sitting Balance ?Dynamic Sitting - Level of Assistance: 7: Independent ?Static Standing Balance ?Static Standing - Level of Assistance: 5: Stand by assistance ?Dynamic Standing Balance ?Dynamic Standing - Balance Support: Bilateral upper extremity supported;During functional activity ?Dynamic Standing - Level of Assistance: 5: Stand by assistance ?Extremity Assessment  ?RLE Assessment ?RLE Assessment: Within Functional Limits ?General Strength Comments: grossly 4+/5 to 5/5 ?LLE Assessment ?LLE Assessment: Exceptions to [facility] ?General Strength Comments: grossly 4/5 to 4+/5 ? ? ? ?Lorie Phenix PT, DPT ?Apolinar Junes PT, DPT ? ?05/04/2021, 10:54 AM ?

## 2021-05-03 NOTE — Progress Notes (Signed)
Occupational Therapy Discharge Summary ? ?Patient Details  ?Name: [name]. ?MRN: [id number] ?Date of Birth: 1970-04-18 ? ? ? ?Patient has met 10 of 11 long term goals due to improved activity tolerance, improved balance, postural control, ability to compensate for deficits, functional use of  LEFT upper extremity, improved awareness, and improved coordination.  Patient to discharge at overall Supervision - Min A level.  Patient's care partner is independent to provide the necessary physical assistance at discharge. 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 Family ed completed with wife on 3/12.  ? ?Reasons goals not met: Pt still requires Min A with UB dressing.  ? ?Recommendation:  ?Patient will benefit from ongoing skilled OT services in home health setting to continue to advance functional skills in the area of BADL, iADL, and Reduce care partner burden. ? ?Equipment: ?BSC ? ?Reasons for discharge: treatment goals met and discharge from hospital ? ?Patient/family agrees with progress made and goals achieved: Yes ? ?OT Discharge ?Precautions/Restrictions  ?Precautions ?Precautions: Fall ?Restrictions ?Weight Bearing Restrictions: No ?Vital Signs ?Therapy Vitals ?Temp: 98.8 ?F (37.1 ?C) ?Pulse Rate: 61 ?Resp: 18 ?BP: (!) 207/85 ?Patient Position (if appropriate): Lying ?Oxygen Therapy ?SpO2: 97 % ?O2 Device: Room Air ?Pain ?Pain Assessment ?Pain Scale: 0-10 ?Pain Score: 0-No pain ?ADL ?ADL ?Eating: Set up ?Grooming: Supervision/safety ?Where Assessed-Grooming: Sitting at sink, Other (comment) (per wife) ?Upper Body Bathing: Supervision/safety ?Where Assessed-Upper Body Bathing: Sitting at sink ?Lower Body Bathing: Minimal assistance ?Where Assessed-Lower Body Bathing: Sitting at sink, Standing at sink ?Upper Body  Dressing: Minimal assistance ?Where Assessed-Upper Body Dressing: Sitting at sink ?Lower Body Dressing: Minimal assistance ?Where Assessed-Lower Body Dressing: Sitting at sink, Standing at sink (MOD A for sit to stand) ?Toileting: Minimal assistance ?Toilet Transfer: Close supervision ?Toilet Transfer Method: Ambulating ?Science writer: Grab bars, Bedside commode ?Tub/Shower Transfer Method: Unable to assess ?Walk-In Shower Transfer Method: Unable to assess ?Vision ?Baseline Vision/History: 2 Legally blind ?Patient Visual Report: No change from baseline ?Vision Assessment?: No apparent visual deficits ?Perception  ?Perception: Within Functional Limits ?Praxis ?Praxis: Intact ?Cognition ?Overall Cognitive Status: History of cognitive impairments - at baseline ?Arousal/Alertness: Awake/alert ?Year: 2023 ?Month: March ?Day of Week: Correct ?Immediate Memory Recall: Sock;Blue;Bed ?Memory Recall Sock: Without Cue ?Memory Recall Blue: Without Cue ?Memory Recall Bed: Without Cue ?Safety/Judgment: Appears intact ?Sensation ?Sensation ?Light Touch: Appears Intact ?Coordination ?Gross Motor Movements are Fluid and Coordinated: No ?Fine Motor Movements are Fluid and Coordinated: No ?Coordination and Movement Description: L sided hemiplegia. UE >LE ?Motor  ?Motor ?Motor: Hemiplegia ?Motor - Discharge Observations: L hemiplegia weakness UE>LE; improved from Eval ?Mobility  ?Transfers ?Sit to Stand: Supervision/Verbal cueing ?Stand to Sit: Supervision/Verbal cueing  ?Trunk/Postural Assessment  ?Cervical Assessment ?Cervical Assessment: Exceptions to [facility] ?Thoracic Assessment ?Thoracic Assessment: Exceptions to [facility] ?Lumbar Assessment ?Lumbar Assessment: Exceptions to [facility] ?Postural Control ?Postural Control: Within Functional Limits  ?Balance ?Balance ?Balance Assessed: Yes ?Dynamic Sitting Balance ?Dynamic Sitting - Balance Support: Feet supported ?Dynamic Sitting - Level of Assistance: 7: Independent ?Dynamic Sitting -  Balance Activities: Forward lean/weight shifting;Lateral lean/weight shifting;Reaching for objects ?Static Standing Balance ?Static Standing - Balance Support: During functional activity ?Static Standing - Level of Assistance: 5: Stand by assistance ?Dynamic Standing Balance ?Dynamic Standing - Balance Support: Bilateral upper extremity supported;During functional activity ?Dynamic Standing -  Level of Assistance: 5: Stand by assistance ?Dynamic Standing - Balance Activities: Lateral lean/weight shifting;Forward lean/weight shifting;Reaching for objects ?Extremity/Trunk Assessment ?RUE Assessment ?RUE Assessment: Within Functional Limits ?LUE Assessment ?LUE Assessment: Exceptions to [facility] ?General Strength Comments: Mild hemi ? ? ?Viona Gilmore ?05/03/2021, 3:10 PM ?

## 2021-05-03 NOTE — Progress Notes (Signed)
Patient ID: [name]., male   DOB: [birth year], 51 y.o.   MRN: [id number] ? ?S: seen in room. Wts up to 82kg today. Feeling much better, orthostatic symptoms have resolved. We gave him 2 -3 liters over the last few days IV NS and weights / volume are up, hopefully can keep him hydrated.  ? ?O:BP (!) 207/85 (BP Location: Left Arm)   Pulse 61   Temp 98.8 ?F (37.1 ?C)   Resp 18   Ht 6\' 3"  (1.905 m)   Wt 82.6 kg   SpO2 97%   BMI 22.76 kg/m?  ? ?Intake/Output Summary (Last 24 hours) at 05/03/2021 1444 ?Last data filed at 05/03/2021 1255 ?Gross per 24 hour  ?Intake 360 ml  ?Output -996 ml  ?Net 1356 ml  ? ? ?Intake/Output: ?I/O last 3 completed shifts: ?In: 240 [P.O.:240] ?Out: -996 [Stool:4] ? Intake/Output this shift: ? Total I/O ?In: 120 [P.O.:120] ?Out: -  ?Weight change: 0.4 kg ?Gen:NAD ?CVS: RRR ?Resp:CTA ?Abd: +BS, soft, NT/ND ?Ext: no edema ?ACCESS: R IJ TDC, R nonfunctioning AVF ?Neuro: L eye closed, L sided weakness ? ?OP HD: [facility] ? 3.5h   400/800   87kg  2/2.5 bath  R AVF/ LIJ TDC  Heparin 1800 + 400u/hr ? - Hep B SAg neg w/ +Abs on 3/03 ? - Micera 150 mcg IVP every 2 weeks, Venofer 50 mg IV TTS ?- calcitriol 0.5 mcg TTS ?  ?Assessment/Plan: ? Acute bilateral watershed stroke - MRA without obstruction of vessels.  Likely due to hemodynamic changes with HD as he and his wife report frequent drops of BP with HD, however after discussion with his home unit, he did not have any drop in BP during HD and had syncope after HD was completed.  Neuro following.  ? Cdif diarrhea - getting po vanc qid. Per pmd. Improving.  ?ESRD -  HD TTS schedule  Cannot dose with midodrine before HD due to prolonged QT interval. We had discussed with the pt and his wife about possible transition to PD at some time in the future if needed (defer to pt's primary nephrologist), although pt is saying he does not want to do PD now. No heparin with HD, pt is on eliquis.  Will try to maintain BP in HD.  Next HD  3/14.  ? Volume depletion- Cdif infection, poor po intake, HD losses. Is feeling better and is only 5kg under today (nadir was 9kg under). BP's higher. May be close to euvolemic now.  ? Anemia  - stable-  hgb over 11-  no meds  ? Metabolic bone disease -  noncompliant with meds. Supposed to be on phoslo and calcitriol ? AFib - hx of, on eliquis ? DM2 - per pmd ?Nutrition - renal diet, carb modified. ?Disposition - in CIR, for discharge tomorrow 3/13.  ? ? ?Kelly Splinter, MD ?04/28/2021, 3:55 PM ? ? ? ? ?Recent Labs  ?Lab 05/01/21 ?1641 05/02/21 ?1139  ?HGB 10.8* 11.3*  ?ALBUMIN 2.8* 2.7*  ?CALCIUM 8.5* 8.8*  ?PHOS 4.6 5.6*  ?CREATININE 9.04* 10.24*  ?K 3.7 4.0  ? ?  ?Medications ? amiodarone  200 mg Oral Daily  ? apixaban  5 mg Oral BID  ? artificial tears   Left Eye QID  ? atorvastatin  80 mg Oral Daily  ? calcitRIOL  0.5 mcg Oral Q T,Th,Sa-HD  ? calcium acetate  1,334 mg Oral TID WC  ? clopidogrel  75 mg Oral Daily  ? dorzolamide-timolol  1 drop Right Eye BID  ? hydrocortisone cream   Topical BID  ? lactobacillus  1 g Oral TID WC  ? metoprolol tartrate  25 mg Oral BID  ? multivitamin  1 tablet Oral QHS  ? multivitamin with minerals  1 tablet Oral Daily  ? prednisoLONE acetate  1 drop Left Eye Q4H  ? vancomycin  125 mg Oral QID  ? ? ? ? ? ? ? ? ? ? ?

## 2021-05-03 NOTE — Progress Notes (Signed)
?                                                       PROGRESS NOTE ? ? ?Subjective/Complaints: ? ?No BM for a couple days no new issues, no abld pain  ? ?ROS: Patient denies CP, SOB, N/V/D ? ?Objective: ?  ?No results found. ?Recent Labs  ?  05/01/21 ?1641 05/02/21 ?1139  ?WBC 9.4 10.3  ?HGB 10.8* 11.3*  ?HCT 36.9* 37.2*  ?PLT 262 261  ? ? ?Recent Labs  ?  05/01/21 ?1641 05/02/21 ?1139  ?NA 138 139  ?K 3.7 4.0  ?CL 107 110  ?CO2 21* 18*  ?GLUCOSE 164* 93  ?BUN 40* 50*  ?CREATININE 9.04* 10.24*  ?CALCIUM 8.5* 8.8*  ? ? ? ?Intake/Output Summary (Last 24 hours) at 05/03/2021 0748 ?Last data filed at 05/03/2021 0300 ?Gross per 24 hour  ?Intake 240 ml  ?Output -996 ml  ?Net 1236 ml  ? ?  ? ?  ? ?Physical Exam: ?Vital Signs ?Blood pressure (!) 187/82, pulse 62, temperature 98.5 ?F (36.9 ?C), resp. rate 16, height 6\' 3"  (1.905 m), weight 82.6 kg, SpO2 100 %. ? ? ? ?General: No acute distress ?Mood and affect are appropriate ?Heart: Regular rate and rhythm no rubs murmurs or extra sounds ?Lungs: Clear to auscultation, breathing unlabored, no rales or wheezes ?Abdomen: Positive bowel sounds, soft nontender to palpation, nondistended ?Extremities: No clubbing, cyanosis, or edema ?Skin: No evidence of breakdown, no evidence of rash, IV site ok  ? ?Musculoskeletal: Full range of motion in all 4 extremities. No joint swelling ? ?Neuro:  alert and oriented x 3. Left facial droop. Speech generally clear. LUE 1+/5 deltoid to 3-/5 bicep, tricep, wrist/hand. LLE 3+ to 4-/5. RUE and RLE 5/5. Senses pain and light touch in all 4's. No resting tone--stable exam  ?Musculoskeletal: Reduced active , PROM Left shoulder  ? ? ?Assessment/Plan: ?1. Functional deficits which require 3+ hours per day of interdisciplinary therapy in a comprehensive inpatient rehab setting. ?Physiatrist is providing close team supervision and 24 hour management of active medical problems listed below. ?Physiatrist and rehab team continue to assess barriers to  discharge/monitor patient progress toward functional and medical goals ? ?Care Tool: ? ?Bathing ?   ?Body parts bathed by patient: Left arm, Right upper leg, Left upper leg, Face, Right arm, Chest, Abdomen  ? Body parts bathed by helper: Buttocks, Right lower leg, Left lower leg ?  ?  ?Bathing assist Assist Level: Moderate Assistance - Patient 50 - 74% ?  ?  ?Upper Body Dressing/Undressing ?Upper body dressing Upper body dressing/undressing activity did not occur (including orthotics): Safety/medical concerns ?What is the patient wearing?: [place] only ?   ?Upper body assist Assist Level: Moderate Assistance - Patient 50 - 74% ?   ?Lower Body Dressing/Undressing ?Lower body dressing ? ? ? Lower body dressing activity did not occur: Safety/medical concerns ?What is the patient wearing?: Pants ? ?  ? ?Lower body assist Assist for lower body dressing: Moderate Assistance - Patient 50 - 74% ?   ? ?Toileting ?Toileting    ?Toileting assist Assist for toileting: Maximal Assistance - Patient 25 - 49% ?  ?  ?Transfers ?Chair/bed transfer ? ?Transfers assist ? Chair/bed transfer activity did not occur: Safety/medical concerns ? ?Chair/bed transfer assist level: Minimal Assistance -  Patient > 75% ?  ?  ?Locomotion ?Ambulation ? ? ?Ambulation assist ? ?   ? ?Assist level: Minimal Assistance - Patient > 75% ?Assistive device: Walker-rolling ?Max distance: 175 ft  ? ?Walk 10 feet activity ? ? ?Assist ?   ? ?Assist level: Contact Guard/Touching assist ?Assistive device: Walker-rolling  ? ?Walk 50 feet activity ? ? ?Assist   ? ?Assist level: Minimal Assistance - Patient > 75% ?Assistive device: Walker-rolling  ? ? ?Walk 150 feet activity ? ? ?Assist Walk 150 feet activity did not occur: Safety/medical concerns ? ?Assist level: Minimal Assistance - Patient > 75% ?Assistive device: Walker-rolling ?  ? ?Walk 10 feet on uneven surface  ?activity ? ? ?Assist Walk 10 feet on uneven surfaces activity did not occur: Safety/medical  concerns ? ? ?  ?   ? ?Wheelchair ? ? ? ? ?Assist Is the patient using a wheelchair?: Yes ?Type of Wheelchair: Manual ?  ? ?Wheelchair assist level: Dependent - Patient 0% ?Max wheelchair distance: 150  ? ? ?Wheelchair 50 feet with 2 turns activity ? ? ? ?Assist ? ?  ?  ? ? ?Assist Level: Dependent - Patient 0%  ? ?Wheelchair 150 feet activity  ? ? ? ?Assist ?   ? ? ?Assist Level: Dependent - Patient 0%  ? ?Blood pressure (!) 187/82, pulse 62, temperature 98.5 ?F (36.9 ?C), resp. rate 16, height 6\' 3"  (1.905 m), weight 82.6 kg, SpO2 100 %. ? ?Medical Problem List and Plan: ?1. Functional deficits secondary to cerebral ischemic stroke, bilateral due to global hypoperfusion ?            -patient may shower ?            -ELOS/Goals: 3/13 , pt has a ENT office consult for parathyroidectomy on 3/15 which has been difficult to schedule  and does not want to miss,  ?Need to D/C on non HD day  ?            -Continue CIR therapies including PT, OT, and SLP   ?2.  Antithrombotics: ?-DVT/anticoagulation:  Pharmaceutical:  -resumed Eliquis ?            -antiplatelet therapy: Resume Plavix ?3. Pain Management: Tylenol prn.  ?4. Mood: LCSW to follow for evaluation and support.  ?            -antipsychotic agents: NA ?5. Neuropsych: This patient may be capable of making decisions on his own behalf. ?6. Skin/Wound Care:  Routine pressure relief measures.  ?7. Fluids/Electrolytes/Nutrition: Strict I/O --not on any fluid restriction at this time.  ?8. Hypertension: Monitor for orthostatic/syncopal episodes with increase in activity. ?            --continue metoprolol tid  ?  ?Vitals:  ? 05/03/21 0040 05/03/21 0446  ?BP: (!) 146/76 (!) 187/82  ?Pulse: 64 62  ?Resp: 14 16  ?Temp: 98.9 ?F (37.2 ?C) 98.5 ?F (36.9 ?C)  ?SpO2: 98% 100%  ?Elevated systolic this am will recheck  ? ?9. CAF: Monitor HR TID ?10. T2DM w/neuropathy and gastroparesis: Not on any meds as A1C normalized-4.7.  ?            --BS have been well controlled.--can dc  cbg's and SSI  .   ?11. H/o GERD w/gastritis/esophagitis: ?46. ESRD: Continue HD TTS at the end of the day to help with tolerance of therapy. ?            -- on phoslo and calcitriol for  metabolic bone disese. ?            --not in favor of transitioning to PD. Plans for graft in LUE? ?Messaged Nephro, regarding diarrhea, fluid loss, potential for Hypo K+  ?BMP Latest Ref Rng & Units 05/02/2021 05/01/2021 04/30/2021  ?Glucose 70 - 99 mg/dL 93 164(H) 100(H)  ?BUN 6 - 20 mg/dL 50(H) 40(H) 53(H)  ?Creatinine 0.61 - 1.24 mg/dL 10.24(H) 9.04(H) 11.17(H)  ?Sodium 135 - 145 mmol/L 139 138 139  ?Potassium 3.5 - 5.1 mmol/L 4.0 3.7 4.2  ?Chloride 98 - 111 mmol/L 110 107 105  ?CO2 22 - 32 mmol/L 18(L) 21(L) 21(L)  ?Calcium 8.9 - 10.3 mg/dL 8.8(L) 8.5(L) 8.8(L)  ?  ?13. Anemia of chronic disease: Stable w/ Hgb @ 11.5 on 03/02. Repeat Hgb tomorrow.  ?14. Glaucoma:s/p Left orbital implant Resume cosopt eye drops.  ?13. Diarrhea: resolved  ?  ?+ C diff on Vanc  ?D/C fiber ?No BM x 2 d D/C cholestyramine   ?-on no scheduled softeners or laxatives ?  ? ?LOS: ?9 days ?A FACE TO FACE EVALUATION WAS PERFORMED ? ?[name] ?05/03/2021, 7:48 AM  ? ?  ?

## 2021-05-03 NOTE — Progress Notes (Signed)
Occupational Therapy Session Note ? ?Patient Details  ?Name: [name]. ?MRN: [id number] ?Date of Birth: Feb 07, 1971 ? ?Today's Date: 05/03/2021 ?OT Individual Time: 3317-4099 ?OT Individual Time Calculation (min): 40 min  ? ? ?Short Term Goals: ?Week 1:  OT Short Term Goal 1 (Week 1): Pt will Macdonald UB with no A except to facilitate LUE To wash RUE + min cuing ?OT Short Term Goal 2 (Week 1): Pt will completes oral care wiht MOD HOH A ?OT Short Term Goal 3 (Week 1): Pt will recall hemi dressing techniques wiht MIN VC ?OT Short Term Goal 4 (Week 1): Pt will transfer to toilet wiht MIN A ? ? ?Skilled Therapeutic Interventions/Progress Updates:  ?  Pt greeted at time of session bed level resting awaiting wife for family education, pt on phone with wife who arrived shortly after. No pain reported. Focus of session on family ed for functional mobility during ADL to/from bathroom, which he performed with Supervision by therapist and spouse, simulated commode transfers and UB/LB bathing to demonstrate Supervision - Min A overall level. BP checked in sitting at 140/76 with a significant drop in standing, however pt feeling okay and asymptomatic. Educated on s/s of [place] and how to manage at home. Pt left bed level resting call bell in reach all needs met waiting for PT family ed.    ? ? ?Therapy Documentation ?Precautions:  ?Precautions ?Precautions: Fall ?Restrictions ?Weight Bearing Restrictions: No ? ? ? ? ? ?Therapy/Group: Individual Therapy ? ?Viona Gilmore ?05/03/2021, 12:34 PM ?

## 2021-05-03 NOTE — Progress Notes (Addendum)
Physical Therapy Session Note ? ?Patient Details  ?Name: [name]. ?MRN: [id number] ?Date of Birth: August 31, 1970 ? ?Today's Date: 05/03/2021 ?PT Individual Time: 2542-7062 ?PT Individual Time Calculation (min): 60 min  ? ?Short Term Goals: ?Week 1:  PT Short Term Goal 1 (Week 1): pt will perform bed mobility with min assist ?PT Short Term Goal 2 (Week 1): Pt will ambulate 73ft with min assist ?PT Short Term Goal 3 (Week 1): Pt will trasnfer to [facility] with min assist and LRAd ?PT Short Term Goal 4 (Week 1): Pt will perform TUG in < 1 minute ? ?Skilled Therapeutic Interventions/Progress Updates:  ?   ?Patient sitting EOB, handed off from [place], [place], with family in the room upon PT arrival. Patient alert and agreeable to PT session. Patient denied pain during session. Per OT, BP dropped with mobility during OT session.  ? ?Patient's wife participated in family education and hands-on training throughout session. Demonstrated safe guarding technique with all mobility. Educated on BP assessment daily with use of medical journal, use of thigh high TED hose with mobility, elevating HOB in lying to reduce elevated BP in lying, fall risk/prevention, home modifications to prevent falls, and activation of emergency services in the event of a fall during session.  ? ?Focused session on BP assessment with mobility and symptoms response. ?Orthostatic Vitals: ?Sitting: BP 113/65, HR 65 ?Standing: BP 74/48, HR 63 ?Supine: BP 137/62, HR 67  ?Sitting: BP 100/53, HR 67 ?After ambulating 20 ft: BP 83/53, HR 68 ?Patient remains asymptomatic throughout. ? ?Per patient's wife, patient has had a fall x1 per month with syncope PTA.  ?LPN and MD made aware. ? ? ?Therapeutic Activity: ?Bed Mobility: Patient performed supine to/from sit with mod I for increased time x3.  ?Transfers: Patient performed sit to/from stand x3 with CGA using RW x2 and rollator x1. Provided verbal cues for hand placement and use of breaks with the rollator. Patient  ambulated 20 feet using a rollator to trial AD with seat to manage symptoms with hypotension, however, patient less steady with rollator and recommending use of RW. Recommend transport chair for transport when BP is low to prevent syncopal episodes at home. Patient and family in agreement.   ? ?Patient in bed with his family at bedside at end of session with breaks locked, bed alarm set, and all needs within reach.  ? ?Therapy Documentation ?Precautions:  ?Precautions ?Precautions: Fall ?Restrictions ?Weight Bearing Restrictions: No ? ? ? ?Therapy/Group: Individual Therapy ? ?Doreene Burke PT, DPT ? ?05/03/2021, 3:09 PM  ?

## 2021-05-04 ENCOUNTER — Telehealth (INDEPENDENT_AMBULATORY_CARE_PROVIDER_SITE_OTHER): Payer: Self-pay | Admitting: Nurse Practitioner

## 2021-05-04 MED ORDER — AMIODARONE HCL 200 MG PO TABS: 200.0000 mg | ORAL_TABLET | Freq: Every day | ORAL | 0 refills | Status: DC

## 2021-05-04 MED ORDER — METOPROLOL TARTRATE 25 MG PO TABS: 25.0000 mg | ORAL_TABLET | Freq: Two times a day (BID) | ORAL | 0 refills | Status: DC

## 2021-05-04 MED ORDER — CLONIDINE HCL 0.1 MG PO TABS: 0.1000 mg | ORAL_TABLET | Freq: Three times a day (TID) | ORAL | 0 refills | Status: DC | PRN

## 2021-05-04 MED ORDER — VANCOMYCIN HCL 125 MG PO CAPS: 125.0000 mg | ORAL_CAPSULE | Freq: Four times a day (QID) | ORAL | 0 refills | Status: DC

## 2021-05-04 MED ORDER — FLORANEX PO PACK: 1.0000 g | PACK | Freq: Three times a day (TID) | ORAL | 0 refills | Status: DC

## 2021-05-04 MED ORDER — MELATONIN 5 MG PO TABS: 5.0000 mg | ORAL_TABLET | Freq: Every evening | ORAL | 0 refills | Status: DC | PRN

## 2021-05-04 MED ORDER — CLOPIDOGREL BISULFATE 75 MG PO TABS: 75.0000 mg | ORAL_TABLET | Freq: Every day | ORAL | 0 refills | Status: DC

## 2021-05-04 MED ORDER — HYDROCORTISONE 1 % EX CREA: TOPICAL_CREAM | Freq: Two times a day (BID) | CUTANEOUS | 0 refills | Status: DC

## 2021-05-04 MED ORDER — PHENYLEPHRINE IN HARD FAT 0.25 % RE SUPP: 1.0000 | Freq: Two times a day (BID) | RECTAL | 0 refills | Status: DC | PRN

## 2021-05-04 MED ORDER — WITCH HAZEL-GLYCERIN EX PADS: MEDICATED_PAD | CUTANEOUS | 12 refills | Status: DC | PRN

## 2021-05-04 MED ORDER — APIXABAN 5 MG PO TABS: 5.0000 mg | ORAL_TABLET | Freq: Two times a day (BID) | ORAL | 0 refills | Status: DC

## 2021-05-04 MED ORDER — ATORVASTATIN CALCIUM 80 MG PO TABS: 80.0000 mg | ORAL_TABLET | Freq: Every day | ORAL | 0 refills | Status: DC

## 2021-05-04 MED ORDER — CALCIUM ACETATE (PHOS BINDER) 667 MG PO CAPS: 1334.0000 mg | ORAL_CAPSULE | Freq: Three times a day (TID) | ORAL | 0 refills | Status: DC

## 2021-05-04 MED ORDER — PREDNISOLONE ACETATE 1 % OP SUSP: 1.0000 [drp] | OPHTHALMIC | 0 refills | Status: DC

## 2021-05-04 MED ORDER — ACETAMINOPHEN 325 MG PO TABS: 325.0000 mg | ORAL_TABLET | ORAL | Status: DC | PRN

## 2021-05-04 NOTE — Telephone Encounter (Signed)
[name] called and wanted to know when can he come in at get a fistular in his left arm and take the port out his chest. Please advise ?

## 2021-05-04 NOTE — Progress Notes (Signed)
Inpatient Rehabilitation Care Coordinator ?Discharge Note  ? ?Patient Details  ?Name: [name]. ?MRN: [id number] ?Date of Birth: [birth year] ? ? ?Discharge location: Home ? ?Length of Stay: 10 Days ? ?Discharge activity level: MOD I/SUP ? ?Home/community participation: Spouse ? ?Patient response SF:SELTRV Literacy - How often do you need to have someone help you when you read instructions, pamphlets, or other written material from your doctor or pharmacy?: Never ? ?Patient response UY:EBXIDH Isolation - How often do you feel lonely or isolated from those around you?: Never ? ?Services provided included: MD, PT, RD, OT, SLP, RN, CM, TR, Pharmacy, Neuropsych, SW ? ?Financial Services:  ?Charity fundraiser Utilized: Private Insurance ?[employer] ? ?Choices offered to/list presented to: patient ? ?Follow-up services arranged:  ?Home Health ?[facility] Health Agency: [facility]  ?  ?  ?  ? ?Patient response to transportation need: ?Is the patient able to respond to transportation needs?: Yes ?In the past 12 months, has lack of transportation kept you from medical appointments or from getting medications?: No ?In the past 12 months, has lack of transportation kept you from meetings, work, or from getting things needed for daily living?: No ? ? ? ?Comments (or additional information): ? ?Patient/Family verbalized understanding of follow-up arrangements:  Yes ? ?Individual responsible for coordination of the follow-up plan: [name] [phone] ? ?Confirmed correct DME delivered: Dyanne Iha 05/04/2021   ? ?Dyanne Iha ?

## 2021-05-04 NOTE — Progress Notes (Signed)
D/C order noted. Contacted [facility] and spoke to [place]. Clinic aware pt for d/c today and will resume care tomorrow. Will fax d/c summary once available for continuation of care.  ? ?Melven Sartorius ?Renal Navigator ?[phone] ?

## 2021-05-04 NOTE — Progress Notes (Signed)
Patient was restful throughout intervals during the shift, no acute distress or discomfort, Remains on Enteric Precaution measures for C-Diff, policy enforced. VS Monitor,Continue regime, Call bell  within reach and bed alarm on. Anticipates discharge today. ?

## 2021-05-04 NOTE — H&P (View-Only) (Signed)
Patient ID: [name]., male   DOB: 1970/06/01, 51 y.o.   MRN: [id number] ? ?OP HD: [facility]  TTS ? 3.5h   400/800   87kg  2/2.5 bath  R AVF/ [facility] TDC  Heparin 1800 + 400u/hr ? - Hep B SAg neg w/ +Abs on 3/03 ? - Micera 150 mcg IVP every 2 weeks, Venofer 50 mg IV TTS ?- calcitriol 0.5 mcg TTS ?  ?Assessment/Plan: ? Acute bilateral watershed stroke - MRA without obstruction of vessels.  Likely due to hemodynamic changes with HD as he and his wife report frequent drops of BP with HD, however after discussion with his home unit, he did not have any drop in BP during HD and had syncope after HD was completed.  Neuro following.  ? Cdif diarrhea - getting po vanc qid. Per pmd. Improving.  ?ESRD -  HD TTS schedule  Cannot dose with midodrine before HD due to prolonged QT interval. We had discussed with the pt and his wife about possible transition to PD at some time in the future if needed (defer to pt's primary nephrologist), although pt is saying he does not want to do PD now. No heparin with HD, pt is on eliquis.  Patient going home today and will notify outpt center that next HD 3/14.  ? Volume depletion- Cdif infection, poor po intake, HD losses. Is feeling better and is only 5kg under today (nadir was 9kg under). BP's higher. May be close to euvolemic now.  ? Anemia  - stable-  hgb over 11-  no meds  ? Metabolic bone disease -  noncompliant with meds. Supposed to be on phoslo and calcitriol ? AFib - hx of, on eliquis ? DM2 - per pmd ?Nutrition - renal diet, carb modified. ?Disposition - in CIR, for discharge today 3/13.  ? ? ? ?S: seen in room. Wts up to 82kg today. Feeling much better, orthostatic symptoms have resolved. Feels ready to go home and asking when staff will bring his clothes. ? ?O:BP (!) 191/89 (BP Location: Left Arm)   Pulse 69   Temp 97.7 ?F (36.5 ?C)   Resp 15   Ht 6\' 3"  (1.905 m)   Wt 82.6 kg   SpO2 100%   BMI 22.76 kg/m?  ? ?Intake/Output Summary (Last 24 hours) at 05/04/2021  0906 ?Last data filed at 05/03/2021 1815 ?Gross per 24 hour  ?Intake 480 ml  ?Output --  ?Net 480 ml  ? ?Intake/Output: ?I/O last 3 completed shifts: ?In: 600 [P.O.:600] ?Out: 4 [Stool:4] ? Intake/Output this shift: ? No intake/output data recorded. ?Weight change:  ?Gen:NAD ?CVS: RRR ?Resp:CTA ?Abd: +BS, soft, NT/ND ?Ext: no edema ?ACCESS: R IJ TDC, R nonfunctioning AVF ?Neuro: L eye closed, L sided weakness ? ? ? ?Recent Labs  ?Lab 05/01/21 ?1641 05/02/21 ?1139  ?HGB 10.8* 11.3*  ?ALBUMIN 2.8* 2.7*  ?CALCIUM 8.5* 8.8*  ?PHOS 4.6 5.6*  ?CREATININE 9.04* 10.24*  ?K 3.7 4.0  ?  ?Medications ? amiodarone  200 mg Oral Daily  ? apixaban  5 mg Oral BID  ? artificial tears   Left Eye QID  ? atorvastatin  80 mg Oral Daily  ? calcitRIOL  0.5 mcg Oral Q T,Th,Sa-HD  ? calcium acetate  1,334 mg Oral TID WC  ? clopidogrel  75 mg Oral Daily  ? dorzolamide-timolol  1 drop Right Eye BID  ? hydrocortisone cream   Topical BID  ? lactobacillus  1 g Oral TID WC  ? metoprolol  tartrate  25 mg Oral BID  ? multivitamin  1 tablet Oral QHS  ? multivitamin with minerals  1 tablet Oral Daily  ? prednisoLONE acetate  1 drop Left Eye Q4H  ? vancomycin  125 mg Oral QID  ? ? ? ? ? ? ? ? ? ? ?

## 2021-05-04 NOTE — Progress Notes (Signed)
Patient ID: [name]., male   DOB: [birth year], 51 y.o.   MRN: [id number] ? ?OP HD: [facility] TTS ? 3.5h   400/800   87kg  2/2.5 bath  R AVF/ [facility] TDC  Heparin 1800 + 400u/hr ? - Hep B SAg neg w/ +Abs on 3/03 ? - Micera 150 mcg IVP every 2 weeks, Venofer 50 mg IV TTS ?- calcitriol 0.5 mcg TTS ?  ?Assessment/Plan: ? Acute bilateral watershed stroke - MRA without obstruction of vessels.  Likely due to hemodynamic changes with HD as he and his wife report frequent drops of BP with HD, however after discussion with his home unit, he did not have any drop in BP during HD and had syncope after HD was completed.  Neuro following.  ? Cdif diarrhea - getting po vanc qid. Per pmd. Improving.  ?ESRD -  HD TTS schedule  Cannot dose with midodrine before HD due to prolonged QT interval. We had discussed with the pt and his wife about possible transition to PD at some time in the future if needed (defer to pt's primary nephrologist), although pt is saying he does not want to do PD now. No heparin with HD, pt is on eliquis.  Patient going home today and will notify outpt center that next HD 3/14.  ? Volume depletion- Cdif infection, poor po intake, HD losses. Is feeling better and is only 5kg under today (nadir was 9kg under). BP's higher. May be close to euvolemic now.  ? Anemia  - stable-  hgb over 11-  no meds  ? Metabolic bone disease -  noncompliant with meds. Supposed to be on phoslo and calcitriol ? AFib - hx of, on eliquis ? DM2 - per pmd ?Nutrition - renal diet, carb modified. ?Disposition - in CIR, for discharge today 3/13.  ? ? ? ?S: seen in room. Wts up to 82kg today. Feeling much better, orthostatic symptoms have resolved. Feels ready to go home and asking when staff will bring his clothes. ? ?O:BP (!) 191/89 (BP Location: Left Arm)   Pulse 69   Temp 97.7 ?F (36.5 ?C)   Resp 15   Ht 6\' 3"  (1.905 m)   Wt 82.6 kg   SpO2 100%   BMI 22.76 kg/m?  ? ?Intake/Output Summary (Last 24 hours) at 05/04/2021  0906 ?Last data filed at 05/03/2021 1815 ?Gross per 24 hour  ?Intake 480 ml  ?Output --  ?Net 480 ml  ? ?Intake/Output: ?I/O last 3 completed shifts: ?In: 600 [P.O.:600] ?Out: 4 [Stool:4] ? Intake/Output this shift: ? No intake/output data recorded. ?Weight change:  ?Gen:NAD ?CVS: RRR ?Resp:CTA ?Abd: +BS, soft, NT/ND ?Ext: no edema ?ACCESS: R IJ TDC, R nonfunctioning AVF ?Neuro: L eye closed, L sided weakness ? ? ? ?Recent Labs  ?Lab 05/01/21 ?1641 05/02/21 ?1139  ?HGB 10.8* 11.3*  ?ALBUMIN 2.8* 2.7*  ?CALCIUM 8.5* 8.8*  ?PHOS 4.6 5.6*  ?CREATININE 9.04* 10.24*  ?K 3.7 4.0  ?  ?Medications ? amiodarone  200 mg Oral Daily  ? apixaban  5 mg Oral BID  ? artificial tears   Left Eye QID  ? atorvastatin  80 mg Oral Daily  ? calcitRIOL  0.5 mcg Oral Q T,Th,Sa-HD  ? calcium acetate  1,334 mg Oral TID WC  ? clopidogrel  75 mg Oral Daily  ? dorzolamide-timolol  1 drop Right Eye BID  ? hydrocortisone cream   Topical BID  ? lactobacillus  1 g Oral TID WC  ? metoprolol  tartrate  25 mg Oral BID  ? multivitamin  1 tablet Oral QHS  ? multivitamin with minerals  1 tablet Oral Daily  ? prednisoLONE acetate  1 drop Left Eye Q4H  ? vancomycin  125 mg Oral QID  ? ? ? ? ? ? ? ? ? ? ?

## 2021-05-04 NOTE — Discharge Summary (Incomplete)
Physician Discharge Summary  Patient ID: [name]. MRN: [id number] DOB/AGE: 08-28-1970 51 y.o.  Admit date: 04/24/2021 Discharge date: 05/04/2021  Discharge Diagnoses:  Principal Problem:   Cerebral ischemic stroke due to global hypoperfusion with watershed infarct [facility])   Discharged Condition: {condition:18240}  Significant Diagnostic Studies: CT HEAD WO CONTRAST (5MM)  Result Date: 04/24/2021 CLINICAL DATA:  51 year old male with neurologic deficit, patchy bilateral superior cortical infarcts on recent MRI. EXAM: CT HEAD WITHOUT CONTRAST TECHNIQUE: Contiguous axial images were obtained from the base of the skull through the vertex without intravenous contrast. RADIATION DOSE REDUCTION: This exam was performed according to the departmental dose-optimization program which includes automated exposure control, adjustment of the mA and/or kV according to patient size and/or use of iterative reconstruction technique. COMPARISON:  Brain MRI 04/21/2021.  Head CT 04/21/2021. FINDINGS: Brain: Bilateral superior frontal gyrus and right posterior parietal cortical infarcts seen recently by MRI remain occult largely occult by CT (series 2, image 23). No associated hemorrhage or mass effect. Stable gray-white matter differentiation elsewhere. The no superimposed midline shift, ventriculomegaly, mass effect, evidence of mass lesion, intracranial hemorrhage or new cortically based infarct. Vascular: Calcified atherosclerosis at the skull base. No suspicious intracranial vascular hyperdensity. Skull: No acute osseous abnormality identified. Sinuses/Orbits: Visualized paranasal sinuses and mastoids are stable and well aerated. Other: Widespread calcified scalp vessel atherosclerosis. Left globe prosthesis. No acute orbit or scalp squamous that suboccipital scalp soft tissue scarring. No acute orbit or scalp soft tissue finding. IMPRESSION: 1. Bilateral patchy cortical infarcts primarily at the vertex last  month remain largely occult by CT. No hemorrhagic transformation or mass effect. 2. No new intracranial abnormality. 3. Extensive calcified scalp vessel atherosclerosis, most commonly observed with End stage renal disease. Electronically Signed   By: Genevie [name] M.D.   On: 04/24/2021 08:14   MR BRAIN WO CONTRAST  Result Date: 04/21/2021 CLINICAL DATA:  Provided history: Neuro deficit, acute, stroke suspected. EXAM: MRI HEAD WITHOUT CONTRAST TECHNIQUE: Multiplanar, multiecho pulse sequences of the brain and surrounding structures were obtained without intravenous contrast. COMPARISON:  CT angiogram head/neck 04/21/2021. Non-contrast head CT 04/21/2021. MRI brain and MRA head 02/13/2021. FINDINGS: Brain: Cerebral volume is normal. There are patchy acute cortically-based infarcts within the bilateral high frontal lobes and right parietal lobe (predominantly within a watershed distribution). A subcentimeter acute infarct is also present within the callosal splenium on the left. Mild multifocal T2 FLAIR hyperintense signal abnormality within the cerebral white matter and pons, nonspecific but compatible chronic small vessel ischemic disease. Redemonstrated chronic lacunar infarct within the right thalamus. No evidence of an intracranial mass. No chronic intracranial blood products. No extra-axial fluid collection. No midline shift. Vascular: Maintained flow voids within the proximal large arterial vessels. Skull and upper cervical spine: No focal suspicious marrow lesion. Incompletely assessed cervical spondylosis. Sinuses/Orbits: Visualized orbits show no acute finding. Left globe prosthesis. Prior right ocular lens replacement. Mild mucosal thickening within the bilateral ethmoid, sphenoid and maxillary sinuses. IMPRESSION: Patchy acute cortically-based infarcts within the bilateral high frontal lobes and right parietal lobe (predominantly in a watershed distribution). Subcentimeter acute infarct within the callosal  splenium on the left. Mild chronic small vessel ischemic changes within the cerebral white matter and pons, similar as compared to the prior brain MRI of 02/13/2021. Redemonstrated chronic lacunar infarct within the right thalamus. Mild mucosal thickening within the bilateral ethmoid, sphenoid and maxillary sinuses. Electronically Signed   By: Kellie Simmering D.O.   On: 04/21/2021 12:41   EEG adult  Result Date: 04/24/2021 Lora Havens, MD     04/24/2021  9:48 AM Patient Name: [name]. MRN: [id number] Epilepsy Attending: Lora Havens Referring Physician/Provider: Deatra James, MD Date: 04/24/2021 Duration: 22.40 mins Patient history: 51 year old male with seizure-like episode.  EEG to evaluate for seizures. Level of alertness: Awake, asleep AEDs during EEG study: None Technical aspects: This EEG study was done with scalp electrodes positioned according to the 10-20 International system of electrode placement. Electrical activity was acquired at a sampling rate of 500Hz  and reviewed with a high frequency filter of 70Hz  and a low frequency filter of 1Hz . EEG data were recorded continuously and digitally stored. Description: The posterior dominant rhythm consists of 9 Hz activity of moderate voltage (25-35 uV) seen predominantly in posterior head regions, symmetric and reactive to eye opening and eye closing. Sleep was characterized by vertex waves, sleep spindles (12 to 14 Hz), maximal frontocentral region. Hyperventilation and photic stimulation were not performed.   IMPRESSION: This study is within normal limits. No seizures or epileptiform discharges were seen throughout the recording. Lora Havens   ECHOCARDIOGRAM COMPLETE  Result Date: 04/22/2021    ECHOCARDIOGRAM REPORT   Patient Name:   [name]. Date of Exam: 04/22/2021 Medical Rec #:  [id number]            Height:       75.0 in Accession #:    [id number]           Weight:       181.7 lb Date of Birth:  [birth year]              BSA:          2.106 m Patient Age:    51 years             BP:           122/78 mmHg Patient Gender: M                    HR:           91 bpm. Exam Location:  [facility] Procedure: 2D Echo, Cardiac Doppler and Color Doppler Indications:    Stroke I63.9  History:        Patient has prior history of Echocardiogram examinations, most                 recent 11/25/2020. CHF, Previous Myocardial Infarction; Risk                 Factors:Hypertension, Diabetes and Dyslipidemia. CAD/ H/o prior                 STEMI 02/2020 s/p DES to LAD, ESRD on hemodialysis [facility]), CVA                 (cerebral vascular accident) ([place]).  Sonographer:    Alvino Chapel RCS Referring Phys: [phone] COURAGE EMOKPAE IMPRESSIONS  1. Left ventricular ejection fraction, by estimation, is 55 to 60%. The left ventricle has normal function. The left ventricle has no regional wall motion abnormalities. There is severe concentric left ventricular hypertrophy. Left ventricular diastolic  parameters are consistent with Grade I diastolic dysfunction (impaired relaxation).  2. Right ventricular systolic function is normal. The right ventricular size is normal. Tricuspid regurgitation signal is inadequate for assessing PA pressure.  3. Left atrial size was severely dilated.  4. Moderate pericardial effusion.  5. The mitral valve is grossly normal. Trivial mitral valve  regurgitation. No evidence of mitral stenosis.  6. The aortic valve is tricuspid. Aortic valve regurgitation is not visualized.  7. Aortic dilatation noted. There is mild dilatation of the aortic root, measuring 44 mm.  8. The inferior vena cava is normal in size with greater than 50% respiratory variability, suggesting right atrial pressure of 3 mmHg. Comparison(s): Improvement in pericardial effusion. Conclusion(s)/Recommendation(s): There is severe LVH, biatrial enlargement, pericardial effusion, and apical sparing pattern on prior strain imaging. Consider outpatient Pyrophosphate imaging for  amyloidosis if clinically indicated. FINDINGS  Left Ventricle: Left ventricular ejection fraction, by estimation, is 55 to 60%. The left ventricle has normal function. The left ventricle has no regional wall motion abnormalities. The left ventricular internal cavity size was normal in size. There is  severe concentric left ventricular hypertrophy. Left ventricular diastolic parameters are consistent with Grade I diastolic dysfunction (impaired relaxation). Right Ventricle: The right ventricular size is normal. No increase in right ventricular wall thickness. Right ventricular systolic function is normal. Tricuspid regurgitation signal is inadequate for assessing PA pressure. Left Atrium: Left atrial size was severely dilated. Right Atrium: Right atrial size was normal in size. Pericardium: A moderately sized pericardial effusion is present. Mitral Valve: The mitral valve is grossly normal. Trivial mitral valve regurgitation. No evidence of mitral valve stenosis. Tricuspid Valve: The tricuspid valve is normal in structure. Tricuspid valve regurgitation is not demonstrated. No evidence of tricuspid stenosis. Aortic Valve: The aortic valve is tricuspid. Aortic valve regurgitation is not visualized. Pulmonic Valve: The pulmonic valve was normal in structure. Pulmonic valve regurgitation is not visualized. No evidence of pulmonic stenosis. Aorta: Aortic dilatation noted. There is mild dilatation of the aortic root, measuring 44 mm. Venous: The inferior vena cava is normal in size with greater than 50% respiratory variability, suggesting right atrial pressure of 3 mmHg. IAS/Shunts: No atrial level shunt detected by color flow Doppler.  LEFT VENTRICLE PLAX 2D LVIDd:         4.70 cm   Diastology LVIDs:         3.10 cm   LV e' medial:    3.81 cm/s LV PW:         1.90 cm   LV E/e' medial:  11.1 LV IVS:        2.00 cm   LV e' lateral:   4.57 cm/s LVOT diam:     2.50 cm   LV E/e' lateral: 9.3 LV SV:         104 LV SV Index:    49 LVOT Area:     4.91 cm  RIGHT VENTRICLE RV S prime:     11.70 cm/s TAPSE (M-mode): 2.2 cm LEFT ATRIUM              Index        RIGHT ATRIUM           Index LA diam:        3.90 cm  1.85 cm/m   RA Area:     22.60 cm LA Vol (A2C):   116.0 ml 55.07 ml/m  RA Volume:   71.50 ml  33.94 ml/m LA Vol (A4C):   79.5 ml  37.74 ml/m LA Biplane Vol: 99.2 ml  47.09 ml/m  AORTIC VALVE LVOT Vmax:   100.00 cm/s LVOT Vmean:  64.700 cm/s LVOT VTI:    0.211 m  AORTA Ao Root diam: 4.40 cm MITRAL VALVE MV Area (PHT): 3.17 cm    SHUNTS MV Decel Time: 239 msec  Systemic VTI:  0.21 m MV E velocity: 42.40 cm/s  Systemic Diam: 2.50 cm MV A velocity: 67.00 cm/s MV E/A ratio:  0.63 Rudean Haskell MD Electronically signed by Rudean Haskell MD Signature Date/Time: 04/22/2021/3:07:17 PM    Final    CT HEAD CODE STROKE WO CONTRAST  Result Date: 04/21/2021 CLINICAL DATA:  Code stroke. Neuro deficit, acute, stroke suspected. Sudden onset left-sided arm weakness and drift. EXAM: CT HEAD WITHOUT CONTRAST TECHNIQUE: Contiguous axial images were obtained from the base of the skull through the vertex without intravenous contrast. RADIATION DOSE REDUCTION: This exam was performed according to the departmental dose-optimization program which includes automated exposure control, adjustment of the mA and/or kV according to patient size and/or use of iterative reconstruction technique. COMPARISON:  Head CT 02/12/2021 and MRI 02/13/2021 FINDINGS: Brain: There is no evidence of an acute infarct, intracranial hemorrhage, mass, midline shift, or extra-axial fluid collection. There is mild cerebral atrophy. Hypodensities in the cerebral white matter bilaterally are unchanged and nonspecific but compatible with mild chronic small vessel ischemic disease. Vascular: Calcified atherosclerosis at the skull base. No hyperdense vessel. Skull: No fracture or suspicious osseous lesion. Sinuses/Orbits: Visualized paranasal sinuses and mastoid air  cells are clear. Right cataract extraction. Left ocular prosthesis. Other: None. ASPECTS [facility] Stroke Program Early CT Score) - Ganglionic level infarction (caudate, lentiform nuclei, internal capsule, insula, M1-M3 cortex): 7 - Supraganglionic infarction (M4-M6 cortex): 3 Total score (0-10 with 10 being normal): 10 IMPRESSION: 1. No evidence of acute intracranial abnormality. ASPECTS of 10. 2. Mild chronic small vessel ischemic disease. These results were called by telephone at the time of interpretation on 04/21/2021 at 10:20 am to Dr. Varney Biles, who verbally acknowledged these results. Electronically Signed   By: Logan Bores M.D.   On: 04/21/2021 10:22   CT ANGIO HEAD NECK W WO CM (CODE STROKE)  Result Date: 04/21/2021 CLINICAL DATA:  Neuro deficit, acute, stroke suspected. Left-sided weakness. EXAM: CT ANGIOGRAPHY HEAD AND NECK TECHNIQUE: Multidetector CT imaging of the head and neck was performed using the standard protocol during bolus administration of intravenous contrast. Multiplanar CT image reconstructions and MIPs were obtained to evaluate the vascular anatomy. Carotid stenosis measurements (when applicable) are obtained utilizing NASCET criteria, using the distal internal carotid diameter as the denominator. RADIATION DOSE REDUCTION: This exam was performed according to the departmental dose-optimization program which includes automated exposure control, adjustment of the mA and/or kV according to patient size and/or use of iterative reconstruction technique. CONTRAST:  43mL OMNIPAQUE IOHEXOL 350 MG/ML SOLN COMPARISON:  Head MRA 02/13/2021. Carotid Doppler ultrasound 02/13/2021. FINDINGS: CTA NECK FINDINGS Aortic arch: Standard 3 vessel aortic arch. Calcified plaque at the left subclavian artery origin without evidence associated significant stenosis. Right carotid system: Patent with minimal plaque in the distal common carotid artery. No evidence of a significant stenosis or dissection.  Left carotid system: Patent with minimal plaque at the carotid bifurcation. No evidence of a significant stenosis or dissection. Vertebral arteries: Patent with the right being moderately dominant. No definite evidence of a flow limiting stenosis or dissection although poor opacification limits assessment. Skeleton: Focally advanced disc degeneration at C3-4, likely with moderate spinal stenosis and moderate left neural foraminal stenosis. Other neck: No evidence of cervical lymphadenopathy or mass. Upper chest: Clear lung apices. Review of the MIP images confirms the above findings CTA HEAD FINDINGS Assessment is limited by poor arterial opacification, with largely nondiagnostic evaluation of the small and medium-sized arteries. Anterior circulation: The intracranial internal carotid arteries  are patent with mild atherosclerotic plaque bilaterally. The MCAs are patent through the bifurcations with poor visualization of the branch vessels. The ACAs are also grossly patent proximally. No sizable aneurysm is identified. Posterior circulation: The intracranial vertebral arteries are patent to the basilar with right greater than left V4 segment atherosclerosis. The basilar artery is patent, and the proximal PCAs are grossly patent. No sizable aneurysm is identified. Venous sinuses: Patent. Anatomic variants: None. Review of the MIP images confirms the above findings IMPRESSION: Limited study due to poor arterial opacification as detailed above. No evidence of a large vessel occlusion through the level of the MCA bifurcations. These results were called by telephone at the time of interpretation on 04/21/2021 at 10:20 am to Dr. Varney Biles, who verbally acknowledged these results. The study was also later discussed by telephone with Dr. Quinn Axe. Electronically Signed   By: Logan Bores M.D.   On: 04/21/2021 10:40    Labs:  Basic Metabolic Panel: Recent Labs  Lab 04/28/21 0635 04/30/21 0827 05/01/21 1641  05/02/21 1139  NA 134* 139 138 139  K 4.4 4.2 3.7 4.0  CL 103 105 107 110  CO2 14* 21* 21* 18*  GLUCOSE 79 100* 164* 93  BUN 75* 53* 40* 50*  CREATININE 12.99* 11.17* 9.04* 10.24*  CALCIUM 8.7* 8.8* 8.5* 8.8*  PHOS 8.6* 6.8* 4.6 5.6*    CBC: Recent Labs  Lab 04/30/21 0827 05/01/21 1641 05/02/21 1139  WBC 8.8 9.4 10.3  NEUTROABS 5.2  --   --   HGB 11.9* 10.8* 11.3*  HCT 39.4 36.9* 37.2*  MCV 78.0* 78.3* 78.0*  PLT 278 262 261    CBG: No results for input(s): GLUCAP in the last 168 hours.  Brief HPI:   [name]. is a 51 y.o. male ***   Hospital Course: [name]. was admitted to rehab 04/24/2021 for inpatient therapies to consist of PT, ST and OT at least three hours five days a week. Past admission physiatrist, therapy team and rehab RN have worked together to provide customized collaborative inpatient rehab.   Blood pressures were monitored on TID basis and   Diabetes has been monitored with ac/hs CBG checks and SSI was use prn for tighter BS control.    Rehab course: During patient's stay in rehab weekly team conferences were held to monitor patient's progress, set goals and discuss barriers to discharge. At admission, patient required  He/She  has had improvement in activity tolerance, balance, postural control as well as ability to compensate for deficits. He/She has had improvement in functional use RUE/LUE  and RLE/LLE as well as improvement in awareness       Disposition:  There are no questions and answers to display.         Diet:  Special Instructions:  Discharge Instructions     Ambulatory referral to Neurology   Complete by: As directed    An appointment is requested in approximately: 2-3 weeks. Stroke follow up   Ambulatory referral to Physical Medicine Rehab   Complete by: As directed    Hospital follow up      Allergies as of 05/04/2021       Reactions   Levofloxacin Swelling   Facial swelling   Promethazine  Diarrhea, Other (See Comments)   Muscle cramps   Malt Other (See Comments)   Cramping        Medication List     STOP taking these medications    carvedilol 25 MG  tablet Commonly known as: COREG   methocarbamol 500 MG tablet Commonly known as: ROBAXIN   tamsulosin 0.4 MG Caps capsule Commonly known as: FLOMAX       TAKE these medications    acetaminophen 325 MG tablet Commonly known as: TYLENOL Take 1-2 tablets (325-650 mg total) by mouth every 4 (four) hours as needed for mild pain.   amiodarone 200 MG tablet Commonly known as: Pacerone Take 1 tablet (200 mg total) by mouth daily   apixaban 5 MG Tabs tablet Commonly known as: ELIQUIS Take 1 tablet (5 mg total) by mouth 2 (two) times daily.   atorvastatin 80 MG tablet Commonly known as: LIPITOR Take 1 tablet (80 mg total) by mouth daily.   calcitRIOL 0.5 MCG capsule Commonly known as: ROCALTROL Take 1 capsule (0.5 mcg total) by mouth Every Tuesday,Thursday,and Saturday with dialysis.   calcium acetate 667 MG capsule Commonly known as: PHOSLO Take 2 capsules (1,334 mg total) by mouth 3 (three) times daily with meals.   cloNIDine 0.1 MG tablet Commonly known as: CATAPRES Take 1 tablet (0.1 mg total) by mouth 3 (three) times daily as needed (systolic blood pressure (top number) is Greater than 200 or DBP (lower number)is Greater than 110). Notes to patient: Check you blood pressures 2-3 times a day and use as needed.    clopidogrel 75 MG tablet Commonly known as: PLAVIX Take 1 tablet (75 mg total) by mouth daily.   dorzolamide-timolol 22.3-6.8 MG/ML ophthalmic solution Commonly known as: COSOPT Place 1 drop into the right eye 2 (two) times daily.   hydrocortisone cream 1 % Apply topically 2 (two) times daily.   lactobacillus Pack Take 1 packet (1 g total) by mouth 3 (three) times daily with meals. Notes to patient: Probiotic to help with colonization after C diff. You can use any available over the  counter.    melatonin 5 MG Tabs Take 1 tablet (5 mg total) by mouth at bedtime as needed.   metoprolol tartrate 25 MG tablet Commonly known as: LOPRESSOR Take 1 tablet (25 mg total) by mouth 2 (two) times daily.   multivitamin with minerals Tabs tablet Take 1 tablet by mouth daily.   nitroGLYCERIN 0.4 MG SL tablet Commonly known as: NITROSTAT Place 1 tablet (0.4 mg total) under the tongue every 5 (five) minutes as needed for chest pain.   phenylephrine 0.25 % suppository Commonly known as: (USE for PREPARATION-H) Place 1 suppository rectally 2 (two) times daily as needed for hemorrhoids. Notes to patient: Preparation H available over the counter   prednisoLONE acetate 1 % ophthalmic suspension Commonly known as: PRED FORTE Place 1 drop into the left eye every 4 (four) hours. Notes to patient: Check with your eye doctor how long you need to be on this.    vancomycin 125 MG capsule Commonly known as: VANCOCIN Take 1 capsule (125 mg total) by mouth 4 (four) times daily.   witch hazel-glycerin pad Commonly known as: TUCKS Apply topically as needed for itching. Notes to patient: TUCKS ---purchase over the counter.         Follow-up Information     [name], MD. Call.   Specialty: Family Medicine Why: for post hospital follow up Contact information: [address] [place] [address] [phone]         Jettie Booze, MD .   Specialties: Cardiology, Radiology, Interventional Cardiology Contact information: [address] [place] [address] [phone]  Kirsteins, Luanna Salk, MD Follow up.   Specialty: Physical Medicine and Rehabilitation Why: office will call you with follow up appointment Contact information: [place] [address] [phone]         [facility] Follow up.   Why: office will call you with follow up appointment Contact information: [address]     [place] [address] [phone]                Signed: Bary Leriche 05/04/2021, 11:03 AM

## 2021-05-04 NOTE — Progress Notes (Signed)
PA Olin Hauser discussed discharge instructions with pt. Pt in agreement. Pt left per wheelchair with all belongings. No complications noted.  ?Sheela Stack, LPN  ?

## 2021-05-04 NOTE — Progress Notes (Signed)
Inpatient Rehabilitation Discharge Medication Review by a Pharmacist ? ?A complete drug regimen review was completed for this patient to identify any potential clinically significant medication issues. ? ?High Risk Drug Classes Is patient taking? Indication by Medication  ?Antipsychotic No   ?Anticoagulant Yes Apixaban- AF  ?Antibiotic Yes Vanco PO- C. diff  ?Opioid Yes OxyIR- pain  ?Antiplatelet Yes Plavix- CVA prophylaxis  ?Hypoglycemics/insulin No   ?Vasoactive Medication Yes Amiodarone, lopressor, clonidine- arrhythmia, rate control  ?Chemotherapy No   ?Other Yes Lipitor- HLD ?Rocaltrol- secondary hyperparathyroidism 2/2 ESRD  ? ? ? ?Type of Medication Issue Identified Description of Issue Recommendation(s)  ?Drug Interaction(s) (clinically significant) ?    ?Duplicate Therapy ?    ?Allergy ?    ?No Medication Administration End Date ? Vanco PO End date for vanco is 05/07/2021 (13 more doses from 05/04/2021)  ?Incorrect Dose ?    ?Additional Drug Therapy Needed ?    ?Significant med changes from prior encounter (inform family/care partners about these prior to discharge).    ?Other ? PTA meds: ?nitroSL ?Flomax Restart PTA meds at time of discharge if warranted  ? ? ?Clinically significant medication issues were identified that warrant physician communication and completion of prescribed/recommended actions by midnight of the next day:  No ? ?Time spent performing this drug regimen review (minutes):  30 ? ? ?Cornisha Zetino BS, PharmD, BCPS ?Clinical Pharmacist ?05/04/2021 9:23 AM ? ? ? ? ?

## 2021-05-04 NOTE — Progress Notes (Signed)
?                                                       PROGRESS NOTE ? ? ?Subjective/Complaints: ? ?Diarrhea resolved , discussed holding metoprolol on HD days ?Also rec TED hose and Abd binder when OOB ? ?ROS: Patient denies CP, SOB, N/V/D ? ?Objective: ?  ?No results found. ?Recent Labs  ?  05/01/21 ?1641 05/02/21 ?1139  ?WBC 9.4 10.3  ?HGB 10.8* 11.3*  ?HCT 36.9* 37.2*  ?PLT 262 261  ? ? ?Recent Labs  ?  05/01/21 ?1641 05/02/21 ?1139  ?NA 138 139  ?K 3.7 4.0  ?CL 107 110  ?CO2 21* 18*  ?GLUCOSE 164* 93  ?BUN 40* 50*  ?CREATININE 9.04* 10.24*  ?CALCIUM 8.5* 8.8*  ? ? ? ?Intake/Output Summary (Last 24 hours) at 05/04/2021 0745 ?Last data filed at 05/03/2021 1815 ?Gross per 24 hour  ?Intake 480 ml  ?Output --  ?Net 480 ml  ? ?  ? ?  ? ?Physical Exam: ?Vital Signs ?Blood pressure (!) 169/72, pulse 68, temperature 98.8 ?F (37.1 ?C), temperature source Oral, resp. rate 18, height 6\' 3"  (1.905 m), weight 82.6 kg, SpO2 100 %. ? ? ? ?General: No acute distress ?Mood and affect are appropriate ?Heart: Regular rate and rhythm no rubs murmurs or extra sounds ?Lungs: Clear to auscultation, breathing unlabored, no rales or wheezes ?Abdomen: Positive bowel sounds, soft nontender to palpation, nondistended ?Extremities: No clubbing, cyanosis, or edema ? ?Assessment/Plan: ?1. Functional deficits due to bilateral watershed infarcts  ?Stable for D/C today ?F/u PCP in 3-4 weeks ?F/u PM&R 2 weeks ?See D/C summary ?See D/C instructions  ? ?Care Tool: ? ?Bathing ?   ?Body parts bathed by patient: Left arm, Right upper leg, Left upper leg, Face, Right arm, Chest, Abdomen, Front perineal area, Left lower leg, Right lower leg  ? Body parts bathed by helper: Buttocks ?  ?  ?Bathing assist Assist Level: Minimal Assistance - Patient > 75% ?  ?  ?Upper Body Dressing/Undressing ?Upper body dressing Upper body dressing/undressing activity did not occur (including orthotics): Safety/medical concerns ?What is the patient wearing?: Pull over  shirt ?   ?Upper body assist Assist Level: Minimal Assistance - Patient > 75% ?   ?Lower Body Dressing/Undressing ?Lower body dressing ? ? ? Lower body dressing activity did not occur: Safety/medical concerns ?What is the patient wearing?: Pants ? ?  ? ?Lower body assist Assist for lower body dressing: Minimal Assistance - Patient > 75% ?   ? ?Toileting ?Toileting    ?Toileting assist Assist for toileting: Minimal Assistance - Patient > 75% ?  ?  ?Transfers ?Chair/bed transfer ? ?Transfers assist ? Chair/bed transfer activity did not occur: Safety/medical concerns ? ?Chair/bed transfer assist level: Supervision/Verbal cueing ?  ?  ?Locomotion ?Ambulation ? ? ?Ambulation assist ? ?   ? ?Assist level: Minimal Assistance - Patient > 75% ?Assistive device: Walker-rolling ?Max distance: 175 ft  ? ?Walk 10 feet activity ? ? ?Assist ?   ? ?Assist level: Contact Guard/Touching assist ?Assistive device: Walker-rolling  ? ?Walk 50 feet activity ? ? ?Assist   ? ?Assist level: Minimal Assistance - Patient > 75% ?Assistive device: Walker-rolling  ? ? ?Walk 150 feet activity ? ? ?Assist Walk 150 feet activity did not occur: Safety/medical concerns ? ?  Assist level: Minimal Assistance - Patient > 75% ?Assistive device: Walker-rolling ?  ? ?Walk 10 feet on uneven surface  ?activity ? ? ?Assist Walk 10 feet on uneven surfaces activity did not occur: Safety/medical concerns ? ? ?  ?   ? ?Wheelchair ? ? ? ? ?Assist Is the patient using a wheelchair?: Yes ?Type of Wheelchair: Manual ?  ? ?Wheelchair assist level: Dependent - Patient 0% ?Max wheelchair distance: 150  ? ? ?Wheelchair 50 feet with 2 turns activity ? ? ? ?Assist ? ?  ?  ? ? ?Assist Level: Dependent - Patient 0%  ? ?Wheelchair 150 feet activity  ? ? ? ?Assist ?   ? ? ?Assist Level: Dependent - Patient 0%  ? ?Blood pressure (!) 169/72, pulse 68, temperature 98.8 ?F (37.1 ?C), temperature source Oral, resp. rate 18, height 6\' 3"  (1.905 m), weight 82.6 kg, SpO2 100  %. ? ?Medical Problem List and Plan: ?1. Functional deficits secondary to cerebral ischemic stroke, bilateral due to global hypoperfusion ?            -patient may shower ?            -d/c today 3/13 , pt has a ENT office consult for parathyroidectomy on 3/15 which has been difficult to schedule  and does not want to miss,  ? ?2.  Antithrombotics: ?-DVT/anticoagulation:  Pharmaceutical:  -resumed Eliquis ?            -antiplatelet therapy: Resume Plavix ?3. Pain Management: Tylenol prn.  ?4. Mood: LCSW to follow for evaluation and support.  ?            -antipsychotic agents: NA ?5. Neuropsych: This patient may be capable of making decisions on his own behalf. ?6. Skin/Wound Care:  Routine pressure relief measures.  ?7. Fluids/Electrolytes/Nutrition: Strict I/O --not on any fluid restriction at this time.  ?8. Hypertension: Monitor for orthostatic/syncopal episodes with increase in activity. ?            --continue metoprolol tid  ?  ?Vitals:  ? 05/03/21 2345 05/04/21 0335  ?BP: (!) 198/86 (!) 169/72  ?Pulse: 68   ?Resp: 16 18  ?Temp:  98.8 ?F (37.1 ?C)  ?SpO2: 98% 100%  ?Elevated systolic this am will recheck  ? ?9. CAF: Monitor HR TID ?10. T2DM w/neuropathy and gastroparesis: Not on any meds as A1C normalized-4.7.  ?            --BS have been well controlled.--can dc cbg's and SSI  .   ?11. H/o GERD w/gastritis/esophagitis: ?60. ESRD: Continue HD TTS at the end of the day to help with tolerance of therapy. ?            -- on phoslo and calcitriol for metabolic bone disese. ?            --not in favor of transitioning to PD. Plans for graft in LUE? ?Messaged Nephro, regarding diarrhea, fluid loss, potential for Hypo K+  ?BMP Latest Ref Rng & Units 05/02/2021 05/01/2021 04/30/2021  ?Glucose 70 - 99 mg/dL 93 164(H) 100(H)  ?BUN 6 - 20 mg/dL 50(H) 40(H) 53(H)  ?Creatinine 0.61 - 1.24 mg/dL 10.24(H) 9.04(H) 11.17(H)  ?Sodium 135 - 145 mmol/L 139 138 139  ?Potassium 3.5 - 5.1 mmol/L 4.0 3.7 4.2  ?Chloride 98 - 111 mmol/L  110 107 105  ?CO2 22 - 32 mmol/L 18(L) 21(L) 21(L)  ?Calcium 8.9 - 10.3 mg/dL 8.8(L) 8.5(L) 8.8(L)  ?  ?13. Anemia of  chronic disease: Stable w/ Hgb @ 11.5 on 03/02. Repeat Hgb tomorrow.  ?14. Glaucoma:s/p Left orbital implant Resume cosopt eye drops.  ?13. Diarrhea: resolved  ?  ?+ C diff on Vanc  ?D/C fiber ?No BM x 2 d D/C cholestyramine   ?-on no scheduled softeners or laxatives ?  ? ?LOS: ?10 days ?A FACE TO FACE EVALUATION WAS PERFORMED ? ?[name] ?05/04/2021, 7:45 AM  ? ?  ?

## 2021-05-04 NOTE — Telephone Encounter (Signed)
Patient was made aware that dialysis will need to fax over an order to get scheduled for a perm cath removed. Patient verbalized that he will contact dialysis with information. ?

## 2021-05-05 DIAGNOSIS — A0472 Enterocolitis due to Clostridium difficile, not specified as recurrent: Secondary | ICD-10-CM

## 2021-05-06 ENCOUNTER — Telehealth: Payer: Self-pay

## 2021-05-06 NOTE — Telephone Encounter (Signed)
Patient called back into the office today voicing that he spoke with dialysis and they were trying to contact us to give verbal orders to get per cath removed. Spoke with Mickel Baas and she let me know that April spoke with dialysis on yesterday and they were sending over orders for patient to get an [place] not to remove perm cath. Let patient know as soon as we get orders for either removal or [place] FO will call him to get scheduled. Patient understood but is still frustrated and wants this to move faster than it is moving  ? ? ?Please advise  ?

## 2021-05-06 NOTE — Progress Notes (Signed)
CODE Stroke times ?959 call ?1004 beeper ?1018 started ?1019 finished ?1019 images sent to [facility] ?1020 completed in epic ?1017 [place] [facility] called  ?

## 2021-05-06 NOTE — Telephone Encounter (Signed)
Transitional Care Call--who you spoke with [name] [phone] (Spouse). ? ? ?Are you/is patient experiencing any problems since coming home? No. Are there any questions regarding any aspect of care? No. ?Are there any questions regarding medications administration/dosing?No.  Are meds being taken as prescribed? Yes, per [name]. Patient should review meds with caller to confirm. [name] not able to review list. List is not present. She was advised to call back if there are any questions about the discharge medications.  ?Have there been any falls? No.  ?Has Home Health been to the house and/or have they contacted you? Per [name] she was told by [name] someone is coming out today.  If not, have you tried to contact them? [facility] phone number given. Can we help you contact them? No. She is to call back to [name] if there are issues.  ?Are bowels and bladder emptying properly? Yes. Are there any unexpected incontinence issues?No.  If applicable, is patient following bowel/bladder programs? N/A ?Any fevers, problems with breathing, unexpected pain? No. ?Are there any skin problems or new areas of breakdown? No. ?Has the patient/family member arranged specialty MD follow up (ie cardiology/neurology/renal/surgical/etc)? No.  Can we help arrange? Follow up phone numbers given. And discharge summary information reviewed. ?Does the patient need any other services or support that we can help arrange? No. Advised to call back if needed. ?Are caregivers following through as expected in assisting the patient? Yes.  ?       11. Has the patient quit smoking, drinking alcohol, or using drugs as recommended? Per wife patient does not do any of those activities.  ? ?Appointment Date/Time/ Arrival time/ and who they are seeing Danella Sensing NP on 05/18/2021 at 10:20 AM. ?[place]

## 2021-05-06 NOTE — Telephone Encounter (Signed)
Spoke with patient and explained that without an order I cannot schedule him to have a procedure. Patient was told that when an order is received he would be called to either be seen or given information regarding a procedure. I contacted [facility] and spoke with Na'Shaminy and explained the situation and per her the patient has had a stroke and was in the hospital for a few weeks. I was also told that Juanda Crumble the PA there was going to fax over an order for the patient. ?

## 2021-05-07 ENCOUNTER — Telehealth: Payer: Self-pay | Admitting: *Deleted

## 2021-05-07 NOTE — Telephone Encounter (Signed)
Sonia Baller called from [facility] to let us know there would be a delayed SOC by one day for services for [name] at his request. Noted. ?

## 2021-05-11 ENCOUNTER — Telehealth (INDEPENDENT_AMBULATORY_CARE_PROVIDER_SITE_OTHER): Payer: Self-pay

## 2021-05-11 NOTE — Telephone Encounter (Signed)
Patient left a voicemail stating that he was having pain at perm cath site. Orders were received by dialysis for the patient to have vein mapping. I left a message on the patient voicemail that he was schedule for 06/10/21. ?

## 2021-05-12 ENCOUNTER — Ambulatory Visit: Payer: Medicare HMO | Admitting: Neurology

## 2021-05-12 ENCOUNTER — Encounter: Payer: Self-pay | Admitting: Neurology

## 2021-05-12 NOTE — Telephone Encounter (Signed)
Patient called back today stating that Juanda Crumble the PA told him that he couldn't be seen unless his insurance was good or about his co-pay. I explained that the nursing staff could not explain if he had a co pay or if his insurance was okay or not because the front desk takes care of insurance payments and verification. Patient then wanted to know if he could be seen earlier, I advised he speak with someone at the front desk as they also schedule appt's in our office.  ?

## 2021-05-13 ENCOUNTER — Telehealth (INDEPENDENT_AMBULATORY_CARE_PROVIDER_SITE_OTHER): Payer: Self-pay

## 2021-05-13 NOTE — Telephone Encounter (Signed)
Patient left a message stating he was having pain where his permcath access is. I returned the call and left a message for the patient to contact the dialysis center and speak with Juanda Crumble the PA  to let him know he is having pain at his permcath site. Per Eulogio Ditch NP if he is having issues that we need to address an order can be sent over for [place]. ?

## 2021-05-14 ENCOUNTER — Telehealth (INDEPENDENT_AMBULATORY_CARE_PROVIDER_SITE_OTHER): Payer: Self-pay | Admitting: Nurse Practitioner

## 2021-05-14 NOTE — Telephone Encounter (Signed)
[name] spoke with someone at the dialysis center and they will have the social worker contact the patient  ?

## 2021-05-14 NOTE — Telephone Encounter (Signed)
A message was left on the patient voicemail on yesterday pertaining to patient concerns. Mickel Baas will be contacting Sherian Rein PA at his dialysis  ?

## 2021-05-14 NOTE — Telephone Encounter (Signed)
[name] stating he is having problems with his port and that it really hurst.  Please advise. ?

## 2021-05-15 NOTE — Telephone Encounter (Signed)
Arna Medici NP has spoke with Juanda Crumble PA from dialysis center about the patient having pain with his perm-a-cath. Charles PA will be sending a order over for the patient to have perm cath exchange. ?

## 2021-05-18 ENCOUNTER — Encounter: Payer: Medicare HMO | Attending: Registered Nurse | Admitting: Registered Nurse

## 2021-05-19 ENCOUNTER — Other Ambulatory Visit: Payer: Self-pay | Admitting: Physical Medicine and Rehabilitation

## 2021-05-21 ENCOUNTER — Telehealth (INDEPENDENT_AMBULATORY_CARE_PROVIDER_SITE_OTHER): Payer: Self-pay

## 2021-05-21 NOTE — Telephone Encounter (Signed)
I received a fax from [name] to have the patients permcath replaced. Patient is scheduled with Dr. Lucky Cowboy for a permcath exchange on 05/25/21 with  a 121:00 pm arrival time to the MM. Pre-procedure instructions were faxed to [facility]. Spoke with the patient and he is aware that the pre-procedure instructions were faxed to dialysis for him. ?

## 2021-05-22 ENCOUNTER — Ambulatory Visit: Payer: Medicare HMO | Admitting: Physician Assistant

## 2021-05-22 NOTE — Telephone Encounter (Signed)
Patient left a message wanting to know if he should stop his Plavix. Patient was advised that he should continue the Plavix and stop the Eliquis per the pre-procedure information given to him. ?

## 2021-05-25 ENCOUNTER — Encounter: Payer: Self-pay | Admitting: Vascular Surgery

## 2021-05-25 ENCOUNTER — Other Ambulatory Visit: Payer: Self-pay

## 2021-05-25 ENCOUNTER — Ambulatory Visit
Admission: RE | Admit: 2021-05-25 | Discharge: 2021-05-25 | Disposition: A | Discharge status: Home / Self Care | Payer: Medicare HMO | Attending: Vascular Surgery | Admitting: Vascular Surgery

## 2021-05-25 ENCOUNTER — Encounter
Admission: RE | Disposition: A | Discharge status: Home / Self Care | Payer: Self-pay | Source: Home / Self Care | Attending: Vascular Surgery

## 2021-05-25 ENCOUNTER — Other Ambulatory Visit (INDEPENDENT_AMBULATORY_CARE_PROVIDER_SITE_OTHER): Payer: Self-pay | Admitting: Nurse Practitioner

## 2021-05-25 DIAGNOSIS — T8249XA Other complication of vascular dialysis catheter, initial encounter: Secondary | ICD-10-CM | POA: Diagnosis not present

## 2021-05-25 DIAGNOSIS — E1122 Type 2 diabetes mellitus with diabetic chronic kidney disease: Secondary | ICD-10-CM | POA: Diagnosis not present

## 2021-05-25 DIAGNOSIS — M898X9 Other specified disorders of bone, unspecified site: Secondary | ICD-10-CM | POA: Diagnosis not present

## 2021-05-25 DIAGNOSIS — R197 Diarrhea, unspecified: Secondary | ICD-10-CM | POA: Insufficient documentation

## 2021-05-25 DIAGNOSIS — D649 Anemia, unspecified: Secondary | ICD-10-CM | POA: Insufficient documentation

## 2021-05-25 DIAGNOSIS — Z7901 Long term (current) use of anticoagulants: Secondary | ICD-10-CM | POA: Diagnosis not present

## 2021-05-25 DIAGNOSIS — Z4901 Encounter for fitting and adjustment of extracorporeal dialysis catheter: Secondary | ICD-10-CM | POA: Insufficient documentation

## 2021-05-25 DIAGNOSIS — E869 Volume depletion, unspecified: Secondary | ICD-10-CM | POA: Diagnosis not present

## 2021-05-25 DIAGNOSIS — N186 End stage renal disease: Secondary | ICD-10-CM

## 2021-05-25 DIAGNOSIS — Z992 Dependence on renal dialysis: Secondary | ICD-10-CM | POA: Diagnosis not present

## 2021-05-25 DIAGNOSIS — I4891 Unspecified atrial fibrillation: Secondary | ICD-10-CM | POA: Diagnosis not present

## 2021-05-25 HISTORY — PX: DIALYSIS/PERMA CATHETER INSERTION: CATH118288

## 2021-05-25 LAB — POTASSIUM (ARMC VASCULAR LAB ONLY): Potassium (ARMC vascular lab): 4.6 (ref 3.5–5.1)

## 2021-05-25 LAB — GLUCOSE, CAPILLARY: Glucose-Capillary: 76 mg/dL (ref 70–99)

## 2021-05-25 SURGERY — DIALYSIS/PERMA CATHETER INSERTION
Anesthesia: Moderate Sedation

## 2021-05-25 MED ORDER — FAMOTIDINE 20 MG PO TABS: 40.0000 mg | ORAL_TABLET | Freq: Once | ORAL | Status: DC | PRN

## 2021-05-25 MED ORDER — MIDAZOLAM HCL 2 MG/ML PO SYRP: 8.0000 mg | ORAL_SOLUTION | Freq: Once | ORAL | Status: DC | PRN

## 2021-05-25 MED ORDER — DIPHENHYDRAMINE HCL 50 MG/ML IJ SOLN: 50.0000 mg | Freq: Once | INTRAMUSCULAR | Status: DC | PRN

## 2021-05-25 MED ORDER — METHYLPREDNISOLONE SODIUM SUCC 125 MG IJ SOLR: 125.0000 mg | Freq: Once | INTRAMUSCULAR | Status: DC | PRN

## 2021-05-25 MED ORDER — ONDANSETRON HCL 4 MG/2ML IJ SOLN: 4.0000 mg | Freq: Four times a day (QID) | INTRAMUSCULAR | Status: DC | PRN

## 2021-05-25 MED ORDER — FENTANYL CITRATE PF 50 MCG/ML IJ SOSY
PREFILLED_SYRINGE | INTRAMUSCULAR | Status: AC
  Filled 2021-05-25: qty 1

## 2021-05-25 MED ORDER — SODIUM CHLORIDE 0.9 % IV SOLN: INTRAVENOUS | Status: DC

## 2021-05-25 MED ORDER — CEFAZOLIN SODIUM-DEXTROSE 1-4 GM/50ML-% IV SOLN
1.0000 g | Freq: Once | INTRAVENOUS | Status: AC
  Administered 2021-05-25: 1 g via INTRAVENOUS

## 2021-05-25 MED ORDER — HYDROMORPHONE HCL 1 MG/ML IJ SOLN: 1.0000 mg | Freq: Once | INTRAMUSCULAR | Status: DC | PRN

## 2021-05-25 MED ORDER — MIDAZOLAM HCL 2 MG/2ML IJ SOLN
INTRAMUSCULAR | Status: DC | PRN
  Administered 2021-05-25: 2 mg via INTRAVENOUS

## 2021-05-25 MED ORDER — MIDAZOLAM HCL 2 MG/2ML IJ SOLN
INTRAMUSCULAR | Status: AC
  Filled 2021-05-25: qty 2

## 2021-05-25 MED ORDER — FENTANYL CITRATE (PF) 100 MCG/2ML IJ SOLN
INTRAMUSCULAR | Status: DC | PRN
  Administered 2021-05-25: 50 ug via INTRAVENOUS

## 2021-05-25 SURGICAL SUPPLY — 6 items
BIOPATCH RED 1 DISK 7.0 (GAUZE/BANDAGES/DRESSINGS) ×1 IMPLANT
CATH PALINDROME-SP 14.5FX23 (CATHETERS) ×1 IMPLANT
GUIDEWIRE SUPER STIFF .035X180 (WIRE) ×1 IMPLANT
PACK ANGIOGRAPHY (CUSTOM PROCEDURE TRAY) ×1 IMPLANT
SUT MNCRL AB 4-0 PS2 18 (SUTURE) ×1 IMPLANT
SUT PROLENE 0 CT 1 30 (SUTURE) ×1 IMPLANT

## 2021-05-25 NOTE — Op Note (Signed)
OPERATIVE NOTE ? ? ? ?PRE-OPERATIVE DIAGNOSIS: 1. ESRD ?2. Non-functional permcath ? ?POST-OPERATIVE DIAGNOSIS: same as above ? ?PROCEDURE: ?Fluoroscopic guidance for placement of catheter ?Placement of a 23 cm tip to cuff tunneled hemodialysis catheter via the left internal jugular vein and removal of previous catheter ? ?SURGEON: [name], MD ? ?ANESTHESIA:  Local with moderate conscious sedation for 16 minutes using 2 mg of Versed and 50 mcg of Fentanyl ? ?ESTIMATED BLOOD LOSS: 3 cc ? ?FINDING(S): ?none ? ?SPECIMEN(S):  None ? ?INDICATIONS:   ?Patient is a 51 y.o.male who presents with non-functional dialysis catheter and ESRD.  The patient needs long term dialysis access for their ESRD, and a Permcath is necessary.  Risks and benefits are discussed and informed consent is obtained.   ? ?DESCRIPTION: ?After obtaining full informed written consent, the patient was brought back to the vascular suite. The patient received moderate conscious sedation during a face-to-face encounter with me present throughout the entire procedure and supervising the RN monitoring the vital signs, pulse oximetry, telemetry, and mental status throughout the entire procedure. The patient's existing catheter, left neck and chest were sterilely prepped and draped in a sterile surgical field was created.  The existing catheter was dissected free from the fibrous sheath securing the cuff with hemostats and blunt dissection.  A wire was placed. The existing catheter was then removed and the wire used to keep venous access. I selected a 23 cm tip to cuff tunneled dialysis catheter.  Using fluoroscopic guidance the catheter tips were parked in the right atrium. The appropriate distal connectors were placed. It withdrew blood well and flushed easily with heparinized saline and a concentrated heparin solution was then placed. It was secured to the chest wall with 2 Prolene sutures. A 4-0 Monocryl pursestring suture was placed around the exit  site. Sterile dressings were placed. ?The patient tolerated the procedure well and was taken to the recovery room in stable condition. ? ?COMPLICATIONS: None ? ?CONDITION: Stable ? ?[name] ?05/25/2021 ?1:52 PM ? ? ?This note was created with Dragon Medical transcription system. Any errors in dictation are purely unintentional.  ?

## 2021-05-25 NOTE — Interval H&P Note (Signed)
History and Physical Interval Note: ? ?05/25/2021 ?12:58 PM ? ?[name].  has presented today for surgery, with the diagnosis of Perma Cath Exchange   End Stage Renal.  The various methods of treatment have been discussed with the patient and family. After consideration of risks, benefits and other options for treatment, the patient has consented to  Procedure(s): ?DIALYSIS/PERMA CATHETER INSERTION (N/A) as a surgical intervention.  The patient's history has been reviewed, patient examined, no change in status, stable for surgery.  I have reviewed the patient's chart and labs.  Questions were answered to the patient's satisfaction.   ? ? ?[name] ? ? ?

## 2021-05-26 ENCOUNTER — Ambulatory Visit (INDEPENDENT_AMBULATORY_CARE_PROVIDER_SITE_OTHER): Payer: Medicare HMO | Admitting: Nurse Practitioner

## 2021-05-26 ENCOUNTER — Other Ambulatory Visit (INDEPENDENT_AMBULATORY_CARE_PROVIDER_SITE_OTHER): Payer: Medicare HMO

## 2021-05-26 ENCOUNTER — Encounter (INDEPENDENT_AMBULATORY_CARE_PROVIDER_SITE_OTHER): Payer: Medicare HMO

## 2021-06-03 ENCOUNTER — Encounter (HOSPITAL_BASED_OUTPATIENT_CLINIC_OR_DEPARTMENT_OTHER): Payer: Self-pay | Admitting: Family

## 2021-06-04 ENCOUNTER — Other Ambulatory Visit: Payer: Self-pay | Admitting: Physical Medicine and Rehabilitation

## 2021-06-06 ENCOUNTER — Other Ambulatory Visit: Payer: Self-pay | Admitting: Physical Medicine and Rehabilitation

## 2021-06-08 ENCOUNTER — Telehealth (INDEPENDENT_AMBULATORY_CARE_PROVIDER_SITE_OTHER): Payer: Self-pay

## 2021-06-08 NOTE — Telephone Encounter (Signed)
Patient called the office stating that he has a lump on his right arm and wanted to know is that normal. I spoke with Arna Medici NP and informed that the patient fistula is occluded with no flow and he should be fine. When the patient come in for his appointment the provider will look at the area.Patient was made aware and verbalized understanding. ?

## 2021-06-09 ENCOUNTER — Emergency Department (HOSPITAL_COMMUNITY)
Admission: EM | Admit: 2021-06-09 | Discharge: 2021-06-09 | Disposition: A | Discharge status: Home / Self Care | Payer: Medicare HMO | Attending: Student | Admitting: Student

## 2021-06-09 DIAGNOSIS — E1122 Type 2 diabetes mellitus with diabetic chronic kidney disease: Secondary | ICD-10-CM | POA: Insufficient documentation

## 2021-06-09 DIAGNOSIS — N189 Chronic kidney disease, unspecified: Secondary | ICD-10-CM | POA: Diagnosis not present

## 2021-06-09 DIAGNOSIS — Z5321 Procedure and treatment not carried out due to patient leaving prior to being seen by health care provider: Secondary | ICD-10-CM | POA: Diagnosis not present

## 2021-06-09 DIAGNOSIS — R253 Fasciculation: Secondary | ICD-10-CM | POA: Insufficient documentation

## 2021-06-09 DIAGNOSIS — I509 Heart failure, unspecified: Secondary | ICD-10-CM | POA: Diagnosis not present

## 2021-06-09 DIAGNOSIS — I130 Hypertensive heart and chronic kidney disease with heart failure and stage 1 through stage 4 chronic kidney disease, or unspecified chronic kidney disease: Secondary | ICD-10-CM | POA: Diagnosis not present

## 2021-06-09 DIAGNOSIS — Z992 Dependence on renal dialysis: Secondary | ICD-10-CM | POA: Insufficient documentation

## 2021-06-09 LAB — CBC
HCT: 39.1 % (ref 39.0–52.0)
Hemoglobin: 11.8 g/dL — ABNORMAL LOW (ref 13.0–17.0)
MCH: 23 pg — ABNORMAL LOW (ref 26.0–34.0)
MCHC: 30.2 g/dL (ref 30.0–36.0)
MCV: 76.4 fL — ABNORMAL LOW (ref 80.0–100.0)
Platelets: 135 10*3/uL — ABNORMAL LOW (ref 150–400)
RBC: 5.12 MIL/uL (ref 4.22–5.81)
RDW: 20.6 % — ABNORMAL HIGH (ref 11.5–15.5)
WBC: 6.5 10*3/uL (ref 4.0–10.5)
nRBC: 0 % (ref 0.0–0.2)

## 2021-06-09 LAB — COMPREHENSIVE METABOLIC PANEL
ALT: 18 U/L (ref 0–44)
AST: 16 U/L (ref 15–41)
Albumin: 3.2 g/dL — ABNORMAL LOW (ref 3.5–5.0)
Alkaline Phosphatase: 69 U/L (ref 38–126)
Anion gap: 9 (ref 5–15)
BUN: 30 mg/dL — ABNORMAL HIGH (ref 6–20)
CO2: 29 mmol/L (ref 22–32)
Calcium: 8.7 mg/dL — ABNORMAL LOW (ref 8.9–10.3)
Chloride: 103 mmol/L (ref 98–111)
Creatinine, Ser: 8.48 mg/dL — ABNORMAL HIGH (ref 0.61–1.24)
GFR, Estimated: 7 mL/min — ABNORMAL LOW (ref >60)
Glucose, Bld: 95 mg/dL (ref 70–99)
Potassium: 4.2 mmol/L (ref 3.5–5.1)
Sodium: 141 mmol/L (ref 135–145)
Total Bilirubin: 1 mg/dL (ref 0.3–1.2)
Total Protein: 6.4 g/dL — ABNORMAL LOW (ref 6.5–8.1)

## 2021-06-09 LAB — MAGNESIUM: Magnesium: 2 mg/dL (ref 1.7–2.4)

## 2021-06-09 NOTE — ED Triage Notes (Signed)
Pt. Stated, [name] been having twitching for over a week all opver my body now and then. I want to make sure Im not having a stroke ?

## 2021-06-09 NOTE — ED Triage Notes (Signed)
EMS stated,  pt was in dialysis and called cause he has been twitching for over a week. Stroke screen negative.  ?

## 2021-06-09 NOTE — ED Provider Triage Note (Signed)
Emergency Medicine Provider Triage Evaluation Note ? ?[name]. , a 51 y.o. male  was evaluated in triage.  Pt complains of twitching.  He is a Monday, Wednesday and Friday dialysis patient.  After dialysis today he felt as though he was twitching all over.  History of hypertension, CHF, anxiety, chronic kidney disease, hyperlipidemia and diabetes. ? ?He reports wanting to make sure he is not having a stroke ? ?Review of Systems  ?Positive: Twitching ?Negative: Weakness, facial droop, slurred speech, numbness or tingling ? ?Physical Exam  ?BP 137/89   Pulse (!) 58   Temp 98.1 ?F (36.7 ?C) (Oral)   Resp 16   SpO2 100%  ?Gen:   Awake, no distress   ?Resp:  Normal effort  ?MSK:   Moves extremities without difficulty  ?Other:  Cranial nerves intact.  No facial droop or slurred speech.  Chronically ill-appearing ?Medical Decision Making  ?Medically screening exam initiated at 11:00 AM.  Appropriate orders placed.  [name]. was informed that the remainder of the evaluation will be completed by another provider, this initial triage assessment does not replace that evaluation, and the importance of remaining in the ED until their evaluation is complete. ? ? ?  ?Rhae Hammock, PA-C ?06/09/21 1102 ? ?

## 2021-06-10 ENCOUNTER — Other Ambulatory Visit (INDEPENDENT_AMBULATORY_CARE_PROVIDER_SITE_OTHER): Payer: Medicare HMO

## 2021-06-10 ENCOUNTER — Encounter (INDEPENDENT_AMBULATORY_CARE_PROVIDER_SITE_OTHER): Payer: Medicare HMO

## 2021-06-10 ENCOUNTER — Ambulatory Visit (INDEPENDENT_AMBULATORY_CARE_PROVIDER_SITE_OTHER): Payer: Medicare HMO | Admitting: Nurse Practitioner

## 2021-06-15 ENCOUNTER — Emergency Department (HOSPITAL_COMMUNITY)
Admission: EM | Admit: 2021-06-15 | Discharge: 2021-06-16 | Disposition: A | Discharge status: Home / Self Care | Payer: Medicare HMO | Attending: Emergency Medicine | Admitting: Emergency Medicine

## 2021-06-15 ENCOUNTER — Encounter (HOSPITAL_COMMUNITY): Payer: Self-pay | Admitting: Emergency Medicine

## 2021-06-15 DIAGNOSIS — E875 Hyperkalemia: Secondary | ICD-10-CM | POA: Insufficient documentation

## 2021-06-15 DIAGNOSIS — Z79899 Other long term (current) drug therapy: Secondary | ICD-10-CM | POA: Insufficient documentation

## 2021-06-15 DIAGNOSIS — Z992 Dependence on renal dialysis: Secondary | ICD-10-CM | POA: Diagnosis not present

## 2021-06-15 DIAGNOSIS — R55 Syncope and collapse: Secondary | ICD-10-CM | POA: Diagnosis not present

## 2021-06-15 DIAGNOSIS — N189 Chronic kidney disease, unspecified: Secondary | ICD-10-CM | POA: Diagnosis not present

## 2021-06-15 DIAGNOSIS — S0990XA Unspecified injury of head, initial encounter: Secondary | ICD-10-CM | POA: Diagnosis not present

## 2021-06-15 DIAGNOSIS — R519 Headache, unspecified: Secondary | ICD-10-CM | POA: Diagnosis present

## 2021-06-15 DIAGNOSIS — E1122 Type 2 diabetes mellitus with diabetic chronic kidney disease: Secondary | ICD-10-CM | POA: Diagnosis not present

## 2021-06-15 DIAGNOSIS — Z7901 Long term (current) use of anticoagulants: Secondary | ICD-10-CM | POA: Diagnosis not present

## 2021-06-15 DIAGNOSIS — I509 Heart failure, unspecified: Secondary | ICD-10-CM | POA: Diagnosis not present

## 2021-06-15 DIAGNOSIS — Z87442 Personal history of urinary calculi: Secondary | ICD-10-CM | POA: Insufficient documentation

## 2021-06-15 DIAGNOSIS — E1140 Type 2 diabetes mellitus with diabetic neuropathy, unspecified: Secondary | ICD-10-CM | POA: Insufficient documentation

## 2021-06-15 DIAGNOSIS — G4489 Other headache syndrome: Secondary | ICD-10-CM | POA: Insufficient documentation

## 2021-06-15 DIAGNOSIS — W01198A Fall on same level from slipping, tripping and stumbling with subsequent striking against other object, initial encounter: Secondary | ICD-10-CM | POA: Diagnosis not present

## 2021-06-15 DIAGNOSIS — I130 Hypertensive heart and chronic kidney disease with heart failure and stage 1 through stage 4 chronic kidney disease, or unspecified chronic kidney disease: Secondary | ICD-10-CM | POA: Insufficient documentation

## 2021-06-15 DIAGNOSIS — S40012A Contusion of left shoulder, initial encounter: Secondary | ICD-10-CM | POA: Diagnosis not present

## 2021-06-15 DIAGNOSIS — Y9301 Activity, walking, marching and hiking: Secondary | ICD-10-CM | POA: Insufficient documentation

## 2021-06-15 LAB — BASIC METABOLIC PANEL
Anion gap: 12 (ref 5–15)
BUN: 67 mg/dL — ABNORMAL HIGH (ref 6–20)
CO2: 20 mmol/L — ABNORMAL LOW (ref 22–32)
Calcium: 8.4 mg/dL — ABNORMAL LOW (ref 8.9–10.3)
Chloride: 108 mmol/L (ref 98–111)
Creatinine, Ser: 10.07 mg/dL — ABNORMAL HIGH (ref 0.61–1.24)
GFR, Estimated: 6 mL/min — ABNORMAL LOW (ref >60)
Glucose, Bld: 81 mg/dL (ref 70–99)
Potassium: 5.4 mmol/L — ABNORMAL HIGH (ref 3.5–5.1)
Sodium: 140 mmol/L (ref 135–145)

## 2021-06-15 LAB — CBC
HCT: 39.1 % (ref 39.0–52.0)
Hemoglobin: 11.4 g/dL — ABNORMAL LOW (ref 13.0–17.0)
MCH: 22.3 pg — ABNORMAL LOW (ref 26.0–34.0)
MCHC: 29.2 g/dL — ABNORMAL LOW (ref 30.0–36.0)
MCV: 76.5 fL — ABNORMAL LOW (ref 80.0–100.0)
Platelets: 220 10*3/uL (ref 150–400)
RBC: 5.11 MIL/uL (ref 4.22–5.81)
RDW: 21.4 % — ABNORMAL HIGH (ref 11.5–15.5)
WBC: 6.4 10*3/uL (ref 4.0–10.5)
nRBC: 0 % (ref 0.0–0.2)

## 2021-06-15 NOTE — ED Triage Notes (Signed)
[name] headache and twitching all over since yesterday. Pt concerned that he may have had another stroke.  ?

## 2021-06-16 ENCOUNTER — Emergency Department (HOSPITAL_COMMUNITY)
Admission: EM | Admit: 2021-06-16 | Discharge: 2021-06-16 | Disposition: A | Discharge status: Home / Self Care | Payer: Medicare HMO | Source: Home / Self Care | Attending: Emergency Medicine | Admitting: Emergency Medicine

## 2021-06-16 ENCOUNTER — Other Ambulatory Visit: Payer: Self-pay

## 2021-06-16 ENCOUNTER — Emergency Department (HOSPITAL_COMMUNITY): Payer: Medicare HMO

## 2021-06-16 ENCOUNTER — Encounter (HOSPITAL_COMMUNITY): Payer: Self-pay | Admitting: Emergency Medicine

## 2021-06-16 DIAGNOSIS — Z7901 Long term (current) use of anticoagulants: Secondary | ICD-10-CM | POA: Insufficient documentation

## 2021-06-16 DIAGNOSIS — W01198A Fall on same level from slipping, tripping and stumbling with subsequent striking against other object, initial encounter: Secondary | ICD-10-CM | POA: Insufficient documentation

## 2021-06-16 DIAGNOSIS — Z992 Dependence on renal dialysis: Secondary | ICD-10-CM | POA: Insufficient documentation

## 2021-06-16 DIAGNOSIS — R55 Syncope and collapse: Secondary | ICD-10-CM | POA: Insufficient documentation

## 2021-06-16 DIAGNOSIS — S0990XA Unspecified injury of head, initial encounter: Secondary | ICD-10-CM | POA: Insufficient documentation

## 2021-06-16 DIAGNOSIS — Y9301 Activity, walking, marching and hiking: Secondary | ICD-10-CM | POA: Insufficient documentation

## 2021-06-16 DIAGNOSIS — S40012A Contusion of left shoulder, initial encounter: Secondary | ICD-10-CM | POA: Insufficient documentation

## 2021-06-16 MED ORDER — SODIUM ZIRCONIUM CYCLOSILICATE 5 G PO PACK
10.0000 g | PACK | Freq: Once | ORAL | Status: AC
  Administered 2021-06-16: 10 g via ORAL
  Filled 2021-06-16: qty 2

## 2021-06-16 NOTE — ED Notes (Signed)
Got pt completely dressed and ready for wife to pick up ?

## 2021-06-16 NOTE — ED Notes (Signed)
Heated up a tray left over from lunch and got pt a diet soda. [place] Per Therapist, sports ?

## 2021-06-16 NOTE — ED Triage Notes (Signed)
Pt completed visit for dialysis this am and was walking to car and felt dizziness and stumbled striking back of head on ground, currently hypertensive and c/o of headache. Seen in this ED this am for shaking. ?

## 2021-06-16 NOTE — ED Provider Notes (Signed)
?[facility] ?Provider Note ? ? ?CSN: [id number] ?Arrival date & time: 06/15/21  2024 ? ?  ? ?History ? ?Chief Complaint  ?Patient presents with  ? Headache  ? ? ?[name]. is a 51 y.o. male. ? ?The history is provided by the patient and the spouse.  ?Headache ?Associated symptoms: no fever   ?Patient with extensive history including previous stroke, end-stage renal disease, already on Plavix and Eliquis presents with headache, twitching and stuttering.  Patient reports that his dialysis session on April 20 he began having twitching afterwards and wife reports has had some stuttering and difficulty in speaking at times.  He reports mild right-sided headache that is improved.  No new weakness.  No new chest pain.  He is due for dialysis tomorrow.  No vomiting today.  No other acute complaint ?  ?Past Medical History:  ?Diagnosis Date  ? Anxiety   ? Cellulitis   ? CHF (congestive heart failure) ([place])   ? Chronic diarrhea   ? Chronic kidney disease   ? Chronic pain of both ankles   ? Diabetes mellitus without complication ([place])   ? Diabetes mellitus, type II ([place])   ? Diabetic neuropathy ([place])   ? Esophagitis   ? Frequent falls   ? Gait instability   ? Gastritis   ? GERD (gastroesophageal reflux disease)   ? H/O enucleation of left eyeball   ? Heart palpitations   ? Hemodialysis patient [facility])   ? History of kidney stones   ? HLD (hyperlipidemia)   ? Hypertension   ? Insomnia   ? Nausea and vomiting in adult   ? recurrent  ? ? ?Home Medications ?Prior to Admission medications   ?Medication Sig Start Date End Date Taking? Authorizing Provider  ?acetaminophen (TYLENOL) 325 MG tablet Take 1-2 tablets (325-650 mg total) by mouth every 4 (four) hours as needed for mild pain. 05/04/21   Love, Ivan Anchors, PA-C  ?amiodarone (PACERONE) 200 MG tablet TAKE 1 TABLET BY MOUTH ONCE DAILY. 06/04/21   Kirsteins, Luanna Salk, MD  ?apixaban (ELIQUIS) 5 MG TABS tablet Take 1 tablet (5 mg total) by mouth 2 (two)  times daily. 05/04/21   Love, Ivan Anchors, PA-C  ?atorvastatin (LIPITOR) 80 MG tablet Take 1 tablet (80 mg total) by mouth daily. 05/04/21 04/29/22  Bary Leriche, PA-C  ?calcium acetate (PHOSLO) 667 MG capsule Take 2 capsules (1,334 mg total) by mouth 3 (three) times daily with meals. 05/04/21   Love, Ivan Anchors, PA-C  ?cloNIDine (CATAPRES) 0.1 MG tablet Take 1 tablet (0.1 mg total) by mouth 3 (three) times daily as needed (systolic blood pressure (top number) is Greater than 200 or DBP (lower number)is Greater than 110). 05/04/21   Love, Ivan Anchors, PA-C  ?clopidogrel (PLAVIX) 75 MG tablet Take 1 tablet (75 mg total) by mouth daily. 05/04/21   Love, Ivan Anchors, PA-C  ?dorzolamide-timolol (COSOPT) 22.3-6.8 MG/ML ophthalmic solution Place 1 drop into the right eye 2 (two) times daily.    [provider]  ?hydrocortisone cream 1 % Apply topically 2 (two) times daily. 05/04/21   Love, Ivan Anchors, PA-C  ?lactobacillus (FLORANEX/LACTINEX) PACK Take 1 packet (1 g total) by mouth 3 (three) times daily with meals. 05/04/21   Love, Ivan Anchors, PA-C  ?melatonin 5 MG TABS Take 1 tablet (5 mg total) by mouth at bedtime as needed. 05/04/21   Love, Ivan Anchors, PA-C  ?metoprolol tartrate (LOPRESSOR) 25 MG tablet Take 1 tablet (25 mg  total) by mouth 2 (two) times daily. 05/04/21   Love, Ivan Anchors, PA-C  ?nitroGLYCERIN (NITROSTAT) 0.4 MG SL tablet Place 1 tablet (0.4 mg total) under the tongue every 5 (five) minutes as needed for chest pain. 12/09/20 12/09/21  Johnson, Clanford L, MD  ?phenylephrine (,USE FOR PREPARATION-H,) 0.25 % suppository Place 1 suppository rectally 2 (two) times daily as needed for hemorrhoids. 05/04/21   Love, Ivan Anchors, PA-C  ?prednisoLONE acetate (PRED FORTE) 1 % ophthalmic suspension Place 1 drop into the left eye every 4 (four) hours. 05/04/21   Love, Ivan Anchors, PA-C  ?vancomycin (VANCOCIN) 125 MG capsule Take 1 capsule (125 mg total) by mouth 4 (four) times daily. 05/04/21   Bary Leriche, PA-C  ?witch hazel-glycerin  (TUCKS) pad Apply topically as needed for itching. 05/04/21   Bary Leriche, PA-C  ?   ? ?Allergies    ?Levofloxacin, Promethazine, and Malt   ? ?Review of Systems   ?Review of Systems  ?Constitutional:  Negative for fever.  ?Cardiovascular:  Negative for chest pain.  ?Neurological:  Positive for headaches.  ? ?Physical Exam ?Updated Vital Signs ?BP (!) 209/104   Pulse 70   Temp 98 ?F (36.7 ?C)   Resp 17   Ht 1.905 m (6\' 3" )   Wt 82.6 kg   SpO2 97%   BMI 22.76 kg/m?  ?Physical Exam ?CONSTITUTIONAL: Chronically ill-appearing, no acute ?HEAD: Normocephalic/atraumatic ?EYES: Left eye enucleated ?ENMT: Mucous membranes moist ?CV: S1/S2 noted ?LUNGS: Lungs are clear to auscultation bilaterally, no apparent distress ?ABDOMEN: soft, nontender, no rebound or guarding ?GU:no cva tenderness ?NEURO:Awake/alert, face symmetric, no arm or leg drift is noted ?Equal 5/5 strength with shoulder abduction, elbow flex/extension, wrist flex/extension in upper extremities and equal hand grips bilaterally ?Equal 5/5 strength with hip flexion,knee flex/extension, foot dorsi/plantar flexion ?Cranial nerves are grossly intact ?Gait normal without ataxia ?Sensation to light touch intact in all extremities ?EXTREMITIES: pulses normal, full ROM ?SKIN: warm, color normal, dialysis access to left chest ?PSYCH: no abnormalities of mood noted ? ?ED Results / Procedures / Treatments   ?Labs ?(all labs ordered are listed, but only abnormal results are displayed) ?Labs Reviewed  ?CBC - Abnormal; Notable for the following components:  ?    Result Value  ? Hemoglobin 11.4 (*)   ? MCV 76.5 (*)   ? MCH 22.3 (*)   ? MCHC 29.2 (*)   ? RDW 21.4 (*)   ? All other components within normal limits  ?BASIC METABOLIC PANEL - Abnormal; Notable for the following components:  ? Potassium 5.4 (*)   ? CO2 20 (*)   ? BUN 67 (*)   ? Creatinine, Ser 10.07 (*)   ? Calcium 8.4 (*)   ? GFR, Estimated 6 (*)   ? All other components within normal limits  ? ? ?EKG ?EKG  Interpretation ? ?Date/Time:  Tuesday June 16 2021 00:12:56 EDT ?Ventricular Rate:  68 ?PR Interval:  194 ?QRS Duration: 113 ?QT Interval:  459 ?QTC Calculation: 489 ?R Axis:   65 ?Text Interpretation: Sinus rhythm Probable left ventricular hypertrophy Anterior Q waves, possibly due to LVH No significant change since last tracing Confirmed by Ripley Fraise [phone]) on 06/16/2021 12:16:01 AM ? ?Radiology ?CT HEAD WO CONTRAST ? ?Result Date: 06/16/2021 ?CLINICAL DATA:  Headache and twitching EXAM: CT HEAD WITHOUT CONTRAST TECHNIQUE: Contiguous axial images were obtained from the base of the skull through the vertex without intravenous contrast. RADIATION DOSE REDUCTION: This exam was performed according to  the departmental dose-optimization program which includes automated exposure control, adjustment of the mA and/or kV according to patient size and/or use of iterative reconstruction technique. COMPARISON:  04/24/2021 FINDINGS: Brain: No evidence of acute infarction, hemorrhage, cerebral edema, mass, mass effect, or midline shift. Previously noted infarcts on the 04/21/2021 MRI are not apparent on this study. No hydrocephalus or extra-axial fluid collection. Vascular: No hyperdense vessel. Atherosclerotic calcifications in the intracranial carotid and vertebral arteries. Skull: Normal. Negative for fracture or focal lesion. Sinuses/Orbits: No acute finding. Left globe prosthesis. Right lens replacement. Other: The mastoid air cells are well aerated. Redemonstrated calcified vessels in the scalp. IMPRESSION: IMPRESSION No acute intracranial process. Electronically Signed   By: Merilyn Baba M.D.   On: 06/16/2021 00:57   ? ?Procedures ?Procedures  ? ? ?Medications Ordered in ED ?Medications  ?sodium zirconium cyclosilicate (LOKELMA) packet 10 g (10 g Oral Given 06/16/21 0153)  ? ? ?ED Course/ Medical Decision Making/ A&P ?Clinical Course as of 06/16/21 0250  ?Tue Jun 16, 2021  ?0125 Patient with history of watershed  infarct but underwent a rehab stay and improved.  He was already on anticoagulation so not a thrombolytic candidate [DW]  ?  ?Clinical Course User Index ?[DW] Ripley Fraise, MD  ? ?                        ?Medical D

## 2021-06-16 NOTE — Discharge Instructions (Signed)
Take your blood pressure medicine when you get home.  Follow-up with your family doctor if any more problems ?

## 2021-06-16 NOTE — ED Notes (Signed)
Pt states his symptoms have been going on for a few days. Says he noticed them while he was at dialysis  ?

## 2021-06-16 NOTE — ED Notes (Signed)
Tu,Thu,Sat dialysis. Did not miss appt Saturday. Is supposed to go today.  ?

## 2021-06-16 NOTE — ED Provider Notes (Signed)
?[facility] ?Provider Note ? ? ?CSN: [id number] ?Arrival date & time: 06/16/21  1052 ? ?  ? ?History ? ?Chief Complaint  ?Patient presents with  ? Near Syncope  ? ? ?[name] Nichoals Heyde. is a 51 y.o. male. ? ?Patient felt dizzy and fell and hit his head and his left shoulder.  Patient is a dialysis patient and this happened right after dialysis.  Patient had no loss of consciousness ? ?The history is provided by the patient and medical records. No language interpreter was used.  ?Near Syncope ?This is a new problem. The current episode started 6 to 12 hours ago. The problem occurs rarely. The problem has been resolved. Pertinent negatives include no chest pain, no abdominal pain and no headaches. Nothing aggravates the symptoms. Nothing relieves the symptoms. He has tried nothing for the symptoms.  ? ?  ? ?Home Medications ?Prior to Admission medications   ?Medication Sig Start Date End Date Taking? Authorizing Provider  ?acetaminophen (TYLENOL) 325 MG tablet Take 1-2 tablets (325-650 mg total) by mouth every 4 (four) hours as needed for mild pain. 05/04/21   Love, Ivan Anchors, PA-C  ?amiodarone (PACERONE) 200 MG tablet TAKE 1 TABLET BY MOUTH ONCE DAILY. 06/04/21   Kirsteins, Luanna Salk, MD  ?apixaban (ELIQUIS) 5 MG TABS tablet Take 1 tablet (5 mg total) by mouth 2 (two) times daily. 05/04/21   Love, Ivan Anchors, PA-C  ?atorvastatin (LIPITOR) 80 MG tablet Take 1 tablet (80 mg total) by mouth daily. 05/04/21 04/29/22  Bary Leriche, PA-C  ?calcium acetate (PHOSLO) 667 MG capsule Take 2 capsules (1,334 mg total) by mouth 3 (three) times daily with meals. 05/04/21   Love, Ivan Anchors, PA-C  ?cloNIDine (CATAPRES) 0.1 MG tablet Take 1 tablet (0.1 mg total) by mouth 3 (three) times daily as needed (systolic blood pressure (top number) is Greater than 200 or DBP (lower number)is Greater than 110). 05/04/21   Love, Ivan Anchors, PA-C  ?clopidogrel (PLAVIX) 75 MG tablet Take 1 tablet (75 mg total) by mouth daily. 05/04/21    Love, Ivan Anchors, PA-C  ?dorzolamide-timolol (COSOPT) 22.3-6.8 MG/ML ophthalmic solution Place 1 drop into the right eye 2 (two) times daily.    [provider]  ?hydrocortisone cream 1 % Apply topically 2 (two) times daily. 05/04/21   Love, Ivan Anchors, PA-C  ?lactobacillus (FLORANEX/LACTINEX) PACK Take 1 packet (1 g total) by mouth 3 (three) times daily with meals. 05/04/21   Love, Ivan Anchors, PA-C  ?melatonin 5 MG TABS Take 1 tablet (5 mg total) by mouth at bedtime as needed. 05/04/21   Love, Ivan Anchors, PA-C  ?metoprolol tartrate (LOPRESSOR) 25 MG tablet Take 1 tablet (25 mg total) by mouth 2 (two) times daily. 05/04/21   Love, Ivan Anchors, PA-C  ?nitroGLYCERIN (NITROSTAT) 0.4 MG SL tablet Place 1 tablet (0.4 mg total) under the tongue every 5 (five) minutes as needed for chest pain. 12/09/20 12/09/21  Johnson, Clanford L, MD  ?phenylephrine (,USE FOR PREPARATION-H,) 0.25 % suppository Place 1 suppository rectally 2 (two) times daily as needed for hemorrhoids. 05/04/21   Love, Ivan Anchors, PA-C  ?prednisoLONE acetate (PRED FORTE) 1 % ophthalmic suspension Place 1 drop into the left eye every 4 (four) hours. 05/04/21   Love, Ivan Anchors, PA-C  ?vancomycin (VANCOCIN) 125 MG capsule Take 1 capsule (125 mg total) by mouth 4 (four) times daily. 05/04/21   Bary Leriche, PA-C  ?witch hazel-glycerin (TUCKS) pad Apply topically as needed for itching. 05/04/21  Bary Leriche, PA-C  ?   ? ?Allergies    ?Levofloxacin, Promethazine, and Malt   ? ?Review of Systems   ?Review of Systems  ?Constitutional:  Negative for appetite change and fatigue.  ?HENT:  Negative for congestion, ear discharge and sinus pressure.   ?Eyes:  Negative for discharge.  ?Respiratory:  Negative for cough.   ?Cardiovascular:  Positive for near-syncope. Negative for chest pain.  ?Gastrointestinal:  Negative for abdominal pain and diarrhea.  ?Genitourinary:  Negative for frequency and hematuria.  ?Musculoskeletal:  Negative for back pain.  ?Skin:  Negative for rash.   ?Neurological:  Positive for dizziness. Negative for seizures and headaches.  ?Psychiatric/Behavioral:  Negative for hallucinations.   ? ?Physical Exam ?Updated Vital Signs ?BP (!) 232/107   Pulse 75   Temp 98.3 ?F (36.8 ?C) (Oral)   Resp 10   Ht 6\' 3"  (1.905 m)   Wt 79.4 kg   SpO2 95%   BMI 21.87 kg/m?  ?Physical Exam ?Vitals and nursing note reviewed.  ?Constitutional:   ?   Appearance: He is well-developed.  ?HENT:  ?   Head: Normocephalic.  ?   Nose: Nose normal.  ?Eyes:  ?   General: No scleral icterus. ?   Conjunctiva/sclera: Conjunctivae normal.  ?Neck:  ?   Thyroid: No thyromegaly.  ?Cardiovascular:  ?   Rate and Rhythm: Normal rate and regular rhythm.  ?   Heart sounds: No murmur heard. ?  No friction rub. No gallop.  ?Pulmonary:  ?   Breath sounds: No stridor. No wheezing or rales.  ?Chest:  ?   Chest wall: No tenderness.  ?Abdominal:  ?   General: There is no distension.  ?   Tenderness: There is no abdominal tenderness. There is no rebound.  ?Musculoskeletal:     ?   General: Normal range of motion.  ?   Cervical back: Neck supple.  ?Lymphadenopathy:  ?   Cervical: No cervical adenopathy.  ?Skin: ?   Findings: No erythema or rash.  ?Neurological:  ?   Mental Status: He is alert and oriented to person, place, and time.  ?   Motor: No abnormal muscle tone.  ?   Coordination: Coordination normal.  ?Psychiatric:     ?   Behavior: Behavior normal.  ? ? ?ED Results / Procedures / Treatments   ?Labs ?(all labs ordered are listed, but only abnormal results are displayed) ?Labs Reviewed - No data to display ? ?EKG ?None ? ?Radiology ?CT HEAD WO CONTRAST ? ?Result Date: 06/16/2021 ?CLINICAL DATA:  Headache and twitching EXAM: CT HEAD WITHOUT CONTRAST TECHNIQUE: Contiguous axial images were obtained from the base of the skull through the vertex without intravenous contrast. RADIATION DOSE REDUCTION: This exam was performed according to the departmental dose-optimization program which includes automated  exposure control, adjustment of the mA and/or kV according to patient size and/or use of iterative reconstruction technique. COMPARISON:  04/24/2021 FINDINGS: Brain: No evidence of acute infarction, hemorrhage, cerebral edema, mass, mass effect, or midline shift. Previously noted infarcts on the 04/21/2021 MRI are not apparent on this study. No hydrocephalus or extra-axial fluid collection. Vascular: No hyperdense vessel. Atherosclerotic calcifications in the intracranial carotid and vertebral arteries. Skull: Normal. Negative for fracture or focal lesion. Sinuses/Orbits: No acute finding. Left globe prosthesis. Right lens replacement. Other: The mastoid air cells are well aerated. Redemonstrated calcified vessels in the scalp. IMPRESSION: IMPRESSION No acute intracranial process. Electronically Signed   By: Francetta Found.D.  On: 06/16/2021 00:57  ? ?DG Shoulder Left ? ?Result Date: 06/16/2021 ?CLINICAL DATA:  Trauma, fall, pain EXAM: LEFT SHOULDER - 2+ VIEW COMPARISON:  09/11/2016 FINDINGS: No recent fracture or dislocation is seen. Degenerative changes are noted with small bony spurs in the shoulder and AC joint. There is decreased distance between humeral head and acromion, possibly suggesting chronic tear of rotator cuff. Extensive arterial calcifications are seen in the soft tissues. Dialysis catheter is noted in the left IJ. IMPRESSION: No recent fracture or dislocation is seen in the left shoulder. Degenerative changes are noted with bony spurs in the left shoulder and left AC joints. Electronically Signed   By: Elmer Picker M.D.   On: 06/16/2021 13:27   ? ?Procedures ?Procedures  ? ? ?Medications Ordered in ED ?Medications - No data to display ? ?ED Course/ Medical Decision Making/ A&P ?  ?                        ?Medical Decision Making ?Amount and/or Complexity of Data Reviewed ?Radiology: ordered. ?ECG/medicine tests: ordered. ? ?This patient presents to the ED for concern of dizziness and fall,  this involves an extensive number of treatment options, and is a complaint that carries with it a high risk of complications and morbidity.  The differential diagnosis includes stroke, hypotension secondary to dial

## 2021-06-16 NOTE — Discharge Instructions (Signed)

## 2021-06-16 NOTE — ED Notes (Signed)
Pt returned from CT °

## 2021-06-23 ENCOUNTER — Inpatient Hospital Stay
Admission: EM | Admit: 2021-06-23 | Discharge: 2021-06-27 | DRG: 640 | Disposition: A | Discharge status: Home Health | Payer: Medicare Other | Attending: Internal Medicine | Admitting: Internal Medicine

## 2021-06-23 ENCOUNTER — Observation Stay: Payer: Medicare Other

## 2021-06-23 ENCOUNTER — Emergency Department: Payer: Medicare Other

## 2021-06-23 ENCOUNTER — Other Ambulatory Visit: Payer: Self-pay

## 2021-06-23 DIAGNOSIS — Z7902 Long term (current) use of antithrombotics/antiplatelets: Secondary | ICD-10-CM | POA: Diagnosis not present

## 2021-06-23 DIAGNOSIS — Z992 Dependence on renal dialysis: Secondary | ICD-10-CM | POA: Diagnosis not present

## 2021-06-23 DIAGNOSIS — D6959 Other secondary thrombocytopenia: Secondary | ICD-10-CM | POA: Diagnosis present

## 2021-06-23 DIAGNOSIS — E1140 Type 2 diabetes mellitus with diabetic neuropathy, unspecified: Secondary | ICD-10-CM | POA: Diagnosis present

## 2021-06-23 DIAGNOSIS — Z8673 Personal history of transient ischemic attack (TIA), and cerebral infarction without residual deficits: Secondary | ICD-10-CM

## 2021-06-23 DIAGNOSIS — N186 End stage renal disease: Secondary | ICD-10-CM | POA: Diagnosis not present

## 2021-06-23 DIAGNOSIS — D631 Anemia in chronic kidney disease: Secondary | ICD-10-CM | POA: Diagnosis not present

## 2021-06-23 DIAGNOSIS — K219 Gastro-esophageal reflux disease without esophagitis: Secondary | ICD-10-CM | POA: Diagnosis not present

## 2021-06-23 DIAGNOSIS — E875 Hyperkalemia: Secondary | ICD-10-CM | POA: Diagnosis not present

## 2021-06-23 DIAGNOSIS — I10 Essential (primary) hypertension: Secondary | ICD-10-CM | POA: Diagnosis present

## 2021-06-23 DIAGNOSIS — Z833 Family history of diabetes mellitus: Secondary | ICD-10-CM

## 2021-06-23 DIAGNOSIS — Z7901 Long term (current) use of anticoagulants: Secondary | ICD-10-CM

## 2021-06-23 DIAGNOSIS — R296 Repeated falls: Secondary | ICD-10-CM | POA: Diagnosis not present

## 2021-06-23 DIAGNOSIS — N2581 Secondary hyperparathyroidism of renal origin: Secondary | ICD-10-CM | POA: Diagnosis present

## 2021-06-23 DIAGNOSIS — M62838 Other muscle spasm: Secondary | ICD-10-CM

## 2021-06-23 DIAGNOSIS — I252 Old myocardial infarction: Secondary | ICD-10-CM | POA: Diagnosis not present

## 2021-06-23 DIAGNOSIS — E785 Hyperlipidemia, unspecified: Secondary | ICD-10-CM | POA: Diagnosis present

## 2021-06-23 DIAGNOSIS — Z79899 Other long term (current) drug therapy: Secondary | ICD-10-CM | POA: Diagnosis not present

## 2021-06-23 DIAGNOSIS — I132 Hypertensive heart and chronic kidney disease with heart failure and with stage 5 chronic kidney disease, or end stage renal disease: Secondary | ICD-10-CM | POA: Diagnosis present

## 2021-06-23 DIAGNOSIS — J690 Pneumonitis due to inhalation of food and vomit: Secondary | ICD-10-CM | POA: Diagnosis not present

## 2021-06-23 DIAGNOSIS — I480 Paroxysmal atrial fibrillation: Secondary | ICD-10-CM | POA: Diagnosis not present

## 2021-06-23 DIAGNOSIS — Z823 Family history of stroke: Secondary | ICD-10-CM | POA: Diagnosis not present

## 2021-06-23 DIAGNOSIS — G253 Myoclonus: Secondary | ICD-10-CM

## 2021-06-23 DIAGNOSIS — I509 Heart failure, unspecified: Secondary | ICD-10-CM | POA: Diagnosis present

## 2021-06-23 DIAGNOSIS — E1151 Type 2 diabetes mellitus with diabetic peripheral angiopathy without gangrene: Secondary | ICD-10-CM | POA: Diagnosis present

## 2021-06-23 DIAGNOSIS — E1122 Type 2 diabetes mellitus with diabetic chronic kidney disease: Secondary | ICD-10-CM | POA: Diagnosis not present

## 2021-06-23 DIAGNOSIS — Z8249 Family history of ischemic heart disease and other diseases of the circulatory system: Secondary | ICD-10-CM

## 2021-06-23 DIAGNOSIS — I2510 Atherosclerotic heart disease of native coronary artery without angina pectoris: Secondary | ICD-10-CM | POA: Diagnosis present

## 2021-06-23 DIAGNOSIS — I639 Cerebral infarction, unspecified: Secondary | ICD-10-CM | POA: Diagnosis present

## 2021-06-23 LAB — BASIC METABOLIC PANEL
Anion gap: 13 (ref 5–15)
BUN: 84 mg/dL — ABNORMAL HIGH (ref 6–20)
CO2: 18 mmol/L — ABNORMAL LOW (ref 22–32)
Calcium: 8.8 mg/dL — ABNORMAL LOW (ref 8.9–10.3)
Chloride: 108 mmol/L (ref 98–111)
Creatinine, Ser: 11.76 mg/dL — ABNORMAL HIGH (ref 0.61–1.24)
GFR, Estimated: 5 mL/min — ABNORMAL LOW (ref >60)
Glucose, Bld: 77 mg/dL (ref 70–99)
Potassium: 6.7 mmol/L (ref 3.5–5.1)
Sodium: 139 mmol/L (ref 135–145)

## 2021-06-23 LAB — CBC
HCT: 39.8 % (ref 39.0–52.0)
Hemoglobin: 11.8 g/dL — ABNORMAL LOW (ref 13.0–17.0)
MCH: 22.2 pg — ABNORMAL LOW (ref 26.0–34.0)
MCHC: 29.6 g/dL — ABNORMAL LOW (ref 30.0–36.0)
MCV: 75 fL — ABNORMAL LOW (ref 80.0–100.0)
Platelets: 165 10*3/uL (ref 150–400)
RBC: 5.31 MIL/uL (ref 4.22–5.81)
RDW: 21 % — ABNORMAL HIGH (ref 11.5–15.5)
WBC: 7 10*3/uL (ref 4.0–10.5)
nRBC: 0 % (ref 0.0–0.2)

## 2021-06-23 LAB — GLUCOSE, CAPILLARY
Glucose-Capillary: 74 mg/dL (ref 70–99)
Glucose-Capillary: 78 mg/dL (ref 70–99)

## 2021-06-23 LAB — POTASSIUM
Potassium: 6.9 mmol/L (ref 3.5–5.1)
Potassium: 5 mmol/L (ref 3.5–5.1)
Potassium: 4.3 mmol/L (ref 3.5–5.1)

## 2021-06-23 LAB — COMPREHENSIVE METABOLIC PANEL
ALT: 9 U/L (ref 0–44)
AST: 15 U/L (ref 15–41)
Albumin: 3.5 g/dL (ref 3.5–5.0)
Alkaline Phosphatase: 84 U/L (ref 38–126)
Anion gap: 15 (ref 5–15)
BUN: 46 mg/dL — ABNORMAL HIGH (ref 6–20)
CO2: 21 mmol/L — ABNORMAL LOW (ref 22–32)
Calcium: 9 mg/dL (ref 8.9–10.3)
Chloride: 101 mmol/L (ref 98–111)
Creatinine, Ser: 7.75 mg/dL — ABNORMAL HIGH (ref 0.61–1.24)
GFR, Estimated: 8 mL/min — ABNORMAL LOW (ref >60)
Glucose, Bld: 87 mg/dL (ref 70–99)
Potassium: 4.6 mmol/L (ref 3.5–5.1)
Sodium: 137 mmol/L (ref 135–145)
Total Bilirubin: 1.1 mg/dL (ref 0.3–1.2)
Total Protein: 7 g/dL (ref 6.5–8.1)

## 2021-06-23 LAB — CBG MONITORING, ED: Glucose-Capillary: 71 mg/dL (ref 70–99)

## 2021-06-23 LAB — MAGNESIUM: Magnesium: 1.9 mg/dL (ref 1.7–2.4)

## 2021-06-23 LAB — AMMONIA: Ammonia: 24 umol/L (ref 9–35)

## 2021-06-23 MED ORDER — AMLODIPINE BESYLATE 5 MG PO TABS
5.0000 mg | ORAL_TABLET | Freq: Every day | ORAL | Status: DC
  Administered 2021-06-24 – 2021-06-27 (×4): 5 mg via ORAL
  Filled 2021-06-23 (×4): qty 1

## 2021-06-23 MED ORDER — HYDRALAZINE HCL 20 MG/ML IJ SOLN
10.0000 mg | Freq: Four times a day (QID) | INTRAMUSCULAR | Status: DC | PRN
  Filled 2021-06-23: qty 1

## 2021-06-23 MED ORDER — CLONIDINE HCL 0.1 MG PO TABS
0.2000 mg | ORAL_TABLET | Freq: Once | ORAL | Status: AC
  Administered 2021-06-23: 0.2 mg via ORAL

## 2021-06-23 MED ORDER — SODIUM CHLORIDE 0.9% FLUSH
3.0000 mL | Freq: Two times a day (BID) | INTRAVENOUS | Status: DC
  Administered 2021-06-24 – 2021-06-27 (×5): 3 mL via INTRAVENOUS

## 2021-06-23 MED ORDER — HEPARIN SODIUM (PORCINE) 1000 UNIT/ML IJ SOLN
INTRAMUSCULAR | Status: AC
  Filled 2021-06-23: qty 10

## 2021-06-23 MED ORDER — SODIUM CHLORIDE 0.9 % IV SOLN: 100.0000 mL | INTRAVENOUS | Status: DC | PRN

## 2021-06-23 MED ORDER — ACETAMINOPHEN 650 MG RE SUPP: 650.0000 mg | Freq: Four times a day (QID) | RECTAL | Status: DC | PRN

## 2021-06-23 MED ORDER — AMIODARONE HCL 200 MG PO TABS
200.0000 mg | ORAL_TABLET | Freq: Every day | ORAL | Status: DC
Start: 2021-06-23 — End: 2021-06-27
  Administered 2021-06-24 – 2021-06-27 (×4): 200 mg via ORAL
  Filled 2021-06-23 (×4): qty 1

## 2021-06-23 MED ORDER — SODIUM CHLORIDE 0.9 % IV SOLN: 250.0000 mL | INTRAVENOUS | Status: DC | PRN

## 2021-06-23 MED ORDER — ACETAMINOPHEN 325 MG PO TABS
650.0000 mg | ORAL_TABLET | Freq: Four times a day (QID) | ORAL | Status: DC | PRN
  Administered 2021-06-24: 650 mg via ORAL
  Filled 2021-06-23: qty 2

## 2021-06-23 MED ORDER — LIDOCAINE HCL (PF) 1 % IJ SOLN
5.0000 mL | INTRAMUSCULAR | Status: DC | PRN
  Filled 2021-06-23: qty 5

## 2021-06-23 MED ORDER — DORZOLAMIDE HCL-TIMOLOL MAL 2-0.5 % OP SOLN
1.0000 [drp] | Freq: Two times a day (BID) | OPHTHALMIC | Status: DC
  Administered 2021-06-24 – 2021-06-27 (×6): 1 [drp] via OPHTHALMIC
  Filled 2021-06-23 (×2): qty 10

## 2021-06-23 MED ORDER — TAMSULOSIN HCL 0.4 MG PO CAPS
0.4000 mg | ORAL_CAPSULE | Freq: Every day | ORAL | Status: DC
  Administered 2021-06-24 – 2021-06-27 (×4): 0.4 mg via ORAL
  Filled 2021-06-23 (×4): qty 1

## 2021-06-23 MED ORDER — LIDOCAINE-PRILOCAINE 2.5-2.5 % EX CREA: 1.0000 "application " | TOPICAL_CREAM | CUTANEOUS | Status: DC | PRN

## 2021-06-23 MED ORDER — CHLORHEXIDINE GLUCONATE CLOTH 2 % EX PADS
6.0000 | MEDICATED_PAD | Freq: Every day | CUTANEOUS | Status: DC
  Administered 2021-06-24 – 2021-06-27 (×4): 6 via TOPICAL

## 2021-06-23 MED ORDER — HEPARIN SODIUM (PORCINE) 1000 UNIT/ML DIALYSIS: 1000.0000 [IU] | INTRAMUSCULAR | Status: DC | PRN

## 2021-06-23 MED ORDER — PHENYLEPHRINE IN HARD FAT 0.25 % RE SUPP
1.0000 | Freq: Every day | RECTAL | Status: DC
  Administered 2021-06-27: 1 via RECTAL
  Filled 2021-06-23 (×4): qty 1

## 2021-06-23 MED ORDER — HYDRALAZINE HCL 20 MG/ML IJ SOLN
INTRAMUSCULAR | Status: AC
  Administered 2021-06-23: 10 mg via INTRAVENOUS
  Filled 2021-06-23: qty 1

## 2021-06-23 MED ORDER — APIXABAN 2.5 MG PO TABS
2.5000 mg | ORAL_TABLET | Freq: Two times a day (BID) | ORAL | Status: DC
Start: 2021-06-23 — End: 2021-06-27
  Administered 2021-06-24 – 2021-06-27 (×8): 2.5 mg via ORAL
  Filled 2021-06-23 (×10): qty 1

## 2021-06-23 MED ORDER — CLONIDINE HCL 0.1 MG PO TABS
ORAL_TABLET | ORAL | Status: AC
  Filled 2021-06-23: qty 2

## 2021-06-23 MED ORDER — MELATONIN 5 MG PO TABS
5.0000 mg | ORAL_TABLET | Freq: Every evening | ORAL | Status: DC | PRN
  Filled 2021-06-23: qty 1

## 2021-06-23 MED ORDER — NITROGLYCERIN 0.4 MG SL SUBL: 0.4000 mg | SUBLINGUAL_TABLET | SUBLINGUAL | Status: DC | PRN

## 2021-06-23 MED ORDER — CLOPIDOGREL BISULFATE 75 MG PO TABS
75.0000 mg | ORAL_TABLET | Freq: Every day | ORAL | Status: DC
  Administered 2021-06-24 – 2021-06-27 (×4): 75 mg via ORAL
  Filled 2021-06-23 (×4): qty 1

## 2021-06-23 MED ORDER — CALCIUM ACETATE (PHOS BINDER) 667 MG PO CAPS
1334.0000 mg | ORAL_CAPSULE | Freq: Three times a day (TID) | ORAL | Status: DC
  Administered 2021-06-24 – 2021-06-27 (×8): 1334 mg via ORAL
  Filled 2021-06-23 (×11): qty 2

## 2021-06-23 MED ORDER — ATORVASTATIN CALCIUM 80 MG PO TABS
80.0000 mg | ORAL_TABLET | Freq: Every day | ORAL | Status: DC
  Administered 2021-06-24 – 2021-06-27 (×4): 80 mg via ORAL
  Filled 2021-06-23 (×3): qty 1
  Filled 2021-06-23: qty 4

## 2021-06-23 MED ORDER — ALTEPLASE 2 MG IJ SOLR: 2.0000 mg | Freq: Once | INTRAMUSCULAR | Status: DC | PRN

## 2021-06-23 MED ORDER — ACETAMINOPHEN 325 MG PO TABS: 325.0000 mg | ORAL_TABLET | ORAL | Status: DC | PRN

## 2021-06-23 MED ORDER — PENTAFLUOROPROP-TETRAFLUOROETH EX AERO: 1.0000 "application " | INHALATION_SPRAY | CUTANEOUS | Status: DC | PRN

## 2021-06-23 MED ORDER — CLONIDINE HCL 0.1 MG PO TABS
0.1000 mg | ORAL_TABLET | Freq: Three times a day (TID) | ORAL | Status: DC | PRN
  Administered 2021-06-24 – 2021-06-27 (×2): 0.1 mg via ORAL
  Filled 2021-06-23 (×2): qty 1

## 2021-06-23 MED ORDER — CHLORHEXIDINE GLUCONATE CLOTH 2 % EX PADS
6.0000 | MEDICATED_PAD | Freq: Every day | CUTANEOUS | Status: DC
  Administered 2021-06-23: 6 via TOPICAL

## 2021-06-23 MED ORDER — CYCLOBENZAPRINE HCL 10 MG PO TABS
10.0000 mg | ORAL_TABLET | Freq: Three times a day (TID) | ORAL | Status: DC
Start: 2021-06-23 — End: 2021-06-27
  Administered 2021-06-24 – 2021-06-27 (×10): 10 mg via ORAL
  Filled 2021-06-23 (×12): qty 1

## 2021-06-23 MED ORDER — HYDRALAZINE HCL 20 MG/ML IJ SOLN
5.0000 mg | Freq: Once | INTRAMUSCULAR | Status: AC
  Administered 2021-06-23: 5 mg via INTRAVENOUS

## 2021-06-23 MED ORDER — SODIUM CHLORIDE 0.9 % IV SOLN
3.0000 g | Freq: Two times a day (BID) | INTRAVENOUS | Status: DC
  Administered 2021-06-24 – 2021-06-27 (×8): 3 g via INTRAVENOUS
  Filled 2021-06-23 (×2): qty 8
  Filled 2021-06-23: qty 3
  Filled 2021-06-23: qty 8
  Filled 2021-06-23 (×3): qty 3
  Filled 2021-06-23: qty 8
  Filled 2021-06-23: qty 3

## 2021-06-23 MED ORDER — LORAZEPAM 2 MG/ML IJ SOLN
0.5000 mg | Freq: Three times a day (TID) | INTRAMUSCULAR | Status: DC | PRN
  Filled 2021-06-23: qty 1

## 2021-06-23 MED ORDER — SODIUM CHLORIDE 0.9% FLUSH
3.0000 mL | INTRAVENOUS | Status: DC | PRN
Start: 2021-06-23 — End: 2021-06-27
  Administered 2021-06-25: 3 mL via INTRAVENOUS

## 2021-06-23 MED ORDER — PREDNISOLONE ACETATE 1 % OP SUSP
1.0000 [drp] | OPHTHALMIC | Status: DC
  Administered 2021-06-24 – 2021-06-27 (×20): 1 [drp] via OPHTHALMIC
  Filled 2021-06-23 (×2): qty 1

## 2021-06-23 MED ORDER — CARVEDILOL 25 MG PO TABS
25.0000 mg | ORAL_TABLET | Freq: Two times a day (BID) | ORAL | Status: DC
  Administered 2021-06-24 – 2021-06-27 (×8): 25 mg via ORAL
  Filled 2021-06-23 (×9): qty 1

## 2021-06-23 NOTE — Progress Notes (Signed)
?[facility]  ?ROUNDING NOTE  ? ?Subjective:  ? ?[name]. Is a 51 year old African-American male with past medical history including diabetes, hypertension, GERD, CVA, and end-stage renal disease on hemodialysis.  Patient presents to the emergency department via EMS with complaints of a   Patient has been admitted for Hyperkalemia [E87.5] ? ?Patient is known to our practice and receives outpatient dialysis treatments at [facility] on a TTS schedule, supervised by Dr. Candiss Norse.  Last dialysis treatment on Saturday, 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s.  Patient seen resting on stretcher, mild tremors at rest.  Patient drowsy but arousable to answer simple questions.  No family at bedside.  Patient states he has felt weak and dizzy.  Endorses following this morning without head injury.  No recent missed dialysis treatments.  Denies nausea or vomiting.  Denies diarrhea.  Denies dark or bloody stools.  Denies shortness of breath or cough.  Chart review shows he may have recently began amlodipine. ? ?Labs on arrival significant for potassium 6.7 with BUN 84 and creatinine 11.76 with GFR 5.  CT head negative for acute changes ? ?We have been consulted to manage dialysis needs. ? ?Objective:  ?Vital signs in last 24 hours:  ?Temp:  [97.6 ?F (36.4 ?C)] 97.6 ?F (36.4 ?C) (05/02 1600) ?Pulse Rate:  [66-68] 68 (05/02 1615) ?Resp:  [10-18] 12 (05/02 1615) ?BP: (185-205)/(92-101) 205/101 (05/02 1603) ?SpO2:  [95 %-100 %] 99 % (05/02 1615) ?Weight:  [79.4 kg] 79.4 kg (05/02 1114) ? ?Weight change:  ?Filed Weights  ? 06/23/21 1114  ?Weight: 79.4 kg  ? ? ?Intake/Output: ?No intake/output data recorded. ?  ?Intake/Output this shift: ? No intake/output data recorded. ? ?Physical Exam: ?General: NAD, drowsy  ?Head: Normocephalic, atraumatic. Moist oral mucosal membranes  ?Eyes: Anicteric  ?Lungs:  Clear to auscultation, normal effort  ?Heart: Regular rate and rhythm  ?Abdomen:  Soft, nontender  ?Extremities: 1+ peripheral  edema.  ?Neurologic: Nonfocal, moving all four extremities  ?Skin: No lesions  ?Access: Right PermCath  ? ? ?Basic Metabolic Panel: ?Recent Labs  ?Lab 06/23/21 ?1124  ?NA 139  ?K 6.7*  ?CL 108  ?CO2 18*  ?GLUCOSE 77  ?BUN 84*  ?CREATININE 11.76*  ?CALCIUM 8.8*  ? ? ?Liver Function Tests: ?No results for input(s): AST, ALT, ALKPHOS, BILITOT, PROT, ALBUMIN in the last 168 hours. ?No results for input(s): LIPASE, AMYLASE in the last 168 hours. ?No results for input(s): AMMONIA in the last 168 hours. ? ?CBC: ?Recent Labs  ?Lab 06/23/21 ?1124  ?WBC 7.0  ?HGB 11.8*  ?HCT 39.8  ?MCV 75.0*  ?PLT 165  ? ? ?Cardiac Enzymes: ?No results for input(s): CKTOTAL, CKMB, CKMBINDEX, TROPONINI in the last 168 hours. ? ?BNP: ?Invalid input(s): POCBNP ? ?CBG: ?Recent Labs  ?Lab 06/23/21 ?1127  ?GLUCAP 71  ? ? ?Microbiology: ?Results for orders placed or performed during the hospital encounter of 04/24/21  ?C Difficile Quick Screen w PCR reflex     Status: Abnormal  ? Collection Time: 04/26/21  8:23 AM  ? Specimen: STOOL  ?Result Value Ref Range Status  ? C Diff antigen POSITIVE (A) NEGATIVE Final  ? C Diff toxin POSITIVE (A) NEGATIVE Final  ? C Diff interpretation Toxin producing C. difficile detected.  Final  ?  Comment: CRITICAL RESULT CALLED TO, READ BACK BY AND VERIFIED WITH: ?RN B.MONCANCI ON [id number] AT [id number] BY E.PARRISH ?Performed at [facility], [place] [address], [place], [place] [address] ?  ? ? ?Coagulation Studies: ?No results  for input(s): LABPROT, INR in the last 72 hours. ? ?Urinalysis: ?No results for input(s): COLORURINE, LABSPEC, [place], GLUCOSEU, HGBUR, BILIRUBINUR, KETONESUR, PROTEINUR, UROBILINOGEN, NITRITE, LEUKOCYTESUR in the last 72 hours. ? ?Invalid input(s): APPERANCEUR  ? ? ?Imaging: ?CT HEAD WO CONTRAST (5MM) ? ?Result Date: 06/23/2021 ?CLINICAL DATA:  Weakness. Fell yesterday. Previous stroke with right-sided weakness. EXAM: CT HEAD WITHOUT CONTRAST TECHNIQUE: Contiguous axial images were obtained from  the base of the skull through the vertex without intravenous contrast. RADIATION DOSE REDUCTION: This exam was performed according to the departmental dose-optimization program which includes automated exposure control, adjustment of the mA and/or kV according to patient size and/or use of iterative reconstruction technique. COMPARISON:  06/16/2021 FINDINGS: Brain: Significantly age advanced cerebral atrophy. Mild low density in the periventricular white matter likely related to small vessel disease. No mass lesion, hemorrhage, hydrocephalus, acute infarct, intra-axial, or extra-axial fluid collection. Vascular: No hyperdense vessel or unexpected calcification. Intracranial atherosclerosis. Skull: Persistent soft tissue thickening in the right paramidline posterior occipital region including on 01/03. Present on 06/16/2021. No skull fracture. Sinuses/Orbits: Small, hypoattenuating left globe may be prostatic. Clear paranasal sinuses and mastoid air cells. Other: None. IMPRESSION: 1. Significantly age advanced cerebral atrophy. Small vessel ischemic change in the periventricular white matter. No acute superimposed process. 2. Persistent soft tissue thickening about the posterior paramidline occiput, possibly chronic hematoma. Electronically Signed   By: Abigail Miyamoto M.D.   On: 06/23/2021 12:20   ? ? ?Medications:  ? ? sodium chloride    ? ? amiodarone  200 mg Oral Daily  ? amLODipine  5 mg Oral Daily  ? apixaban  2.5 mg Oral BID  ? atorvastatin  80 mg Oral Daily  ? calcium acetate  1,334 mg Oral TID WC  ? carvedilol  25 mg Oral BID  ? [START ON 06/24/2021] Chlorhexidine Gluconate Cloth  6 each Topical Q0600  ? clopidogrel  75 mg Oral Daily  ? cyclobenzaprine  10 mg Oral TID  ? [START ON 06/24/2021] dorzolamide-timolol  1 drop Right Eye BID  ? [START ON 06/24/2021] phenylephrine  1 suppository Rectal Daily  ? prednisoLONE acetate  1 drop Left Eye Q4H  ? sodium chloride flush  3 mL Intravenous Q12H  ? tamsulosin  0.4 mg Oral  Daily  ? ?sodium chloride, acetaminophen **OR** acetaminophen, cloNIDine, LORazepam, melatonin, nitroGLYCERIN, sodium chloride flush ? ?Assessment/ Plan:  ?Mr. Makaveli Amaurie Wandel. is a 51 y.o.  male with past medical history including diabetes, hypertension, GERD, CVA, and end-stage renal disease on hemodialysis.  Patient presents to the emergency department via EMS with complaints of a   Patient has been admitted for Hyperkalemia [E87.5] ? ?[facility] TTS right PermCath ? ?Hyperkalemia with end-stage renal disease on hemodialysis.  Potassium on admission 6.7.  Last dialysis treatment on Saturday.  Patient will receive urgent dialysis on 1K bath with hourly potassium levels.  Once potassium less than 5, will transition to 2K bath for remainder of treatment.  ? ?2. Anemia of chronic kidney disease ?Microcytic ?Lab Results  ?Component Value Date  ? HGB 11.8 (L) 06/23/2021  ?  ?Hemoglobin within acceptable range. ? ?3. Secondary Hyperparathyroidism:  ?Lab Results  ?Component Value Date  ? CALCIUM 8.8 (L) 06/23/2021  ? CAION 1.11 (L) 02/12/2021  ? PHOS 5.6 (H) 05/02/2021  ?  ?Will monitor bone minerals during this admission.  Calcium and phosphorus within acceptable range.  Calcium acetate prescribed with meals outpatient. ? ?4.  Hypertension with chronic kidney disease.  Home regimen includes amlodipine, carvedilol, clonidine, and metoprolol.  Metoprolol currently held.  Blood pressure currently elevated 205/101. ? ? LOS: 0 ?  ?5/2/20234:29 PM ?  ?

## 2021-06-23 NOTE — Progress Notes (Signed)
? ?      CROSS COVER NOTE ? ?NAME: [name]. ?MRN: [id number] ?DOB : [birth year] ? ? ? ?Rapid response called on [name] for BP 256/122 and seizure like activity, [name] just arrived to the floor from MRI. Staff report that [name] vomited and they are concerned he aspirated during the seizure like activity. ? ?On arrival to bedside [name] oscillates between being alert and somnolent.When alert he is able to track but does not follow commands or speak. While alert he attempts to get out of bed, moves bilateral upper and lower extremities equally. Pupils are reactive and sluggish. He has emesis with food in his beard. He continues to have myoclonic jerking previously described in Dr Johny Chess note. ? ?Teleneuro consulted for decreased responsiveness and IV unasyn ordered for witnessed aspiration. ? ?0300: BP meds not given prior to dialysis today. IV hydralazine given prior to patient passing swallow screen. Coreg and Clonidine given this AM and home amlodipine given. ? ?Neomia Glass MHA, MSN, FNP-BC ?Nurse Practitioner ?[facility] ?[place] ?Pager [phone] ? ? ? ?

## 2021-06-23 NOTE — Assessment & Plan Note (Signed)
Patient has had several falls in the last 1 week ?Falls appear to be mechanical and he denies having any loss of consciousness ?We will request PT eval ?Place patient on fall precautions ?

## 2021-06-23 NOTE — Assessment & Plan Note (Signed)
Patient's wife notes involuntary jerking movements involving his extremities which he has had for about a week ?Was initially told it was due to uremia but this has persisted despite patient being compliant with his renal replacement therapy ?Consult neurology ?Obtain MRI of the brain without contrast to rule out an acute stroke ?Place patient on as needed lorazepam ?

## 2021-06-23 NOTE — ED Triage Notes (Addendum)
Pt here via [facility] with weakness and jerking since last Thurs. Wife states she just went to another facility for the same reason but unsure of what is causing his symptoms. Pt fell today but denies hitting his head, pt also fell last week and was scanned. Pt denies pain, N/V/D. ?

## 2021-06-23 NOTE — Assessment & Plan Note (Signed)
History of CVA ?Continue Plavix and atorvastatin ?

## 2021-06-23 NOTE — Assessment & Plan Note (Signed)
Blood pressure is uncontrolled ?Continue clonidine, carvedilol and amlodipine ?

## 2021-06-23 NOTE — ED Notes (Signed)
Pt presents to ED with c/o of weakness that has been ongoing "for awhile". Wife states multiple strokes and states pt has been declining since then. Pt is a dialysis pt and reports jerking as well and states he waiting to f/up with neurology for this. Pt and wife states multiple falls recently but denies injury or hitting head, pt is on blood thinners. Pt is A&Ox4 at this time. Pt states he missed dialysis yesterday due to weakness. Pt states normal schedule is T,Th, Sat. Pt states normal dialysis this past Saturday with 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s.  ?

## 2021-06-23 NOTE — Assessment & Plan Note (Signed)
Secondary to known end-stage renal disease ?Patient is scheduled for renal replacement therapy today ?Repeat potassium levels after dialysis ?

## 2021-06-23 NOTE — Progress Notes (Signed)
Discussed with NP : Neomia Glass, I am very concerned about patients BP . He had a dose of hydralazine at 2217, BP remains 215/105, patient is obtundent, he occasionally arouses to follow commands, but not consistent. He does not speak. He does mumble but incomprehensible speech . Per NP, ok to repeat hydralazine 1 hour after intial dose at 2217 and give 10mg  IV then. Administered   ?

## 2021-06-23 NOTE — ED Triage Notes (Addendum)
Pt comes into the ED via EMS from home, started amlodipine yesterday, had increased weakness, fell yesterday , states having episodes of full body twitching, new studer since last night, previous stroke with right sided weakness ? ?200/100 ?#20gLFA ?[id number] ? ?

## 2021-06-23 NOTE — Assessment & Plan Note (Signed)
Continue carvedilol and amiodarone for rate control ?Continue apixaban as secondary prophylaxis for an acute stroke ?

## 2021-06-23 NOTE — ED Notes (Signed)
LKW was last week per pt and wife. ?

## 2021-06-23 NOTE — H&P (Signed)
?History and Physical  ? ? ?Patient: [name]. PYK:[id number] DOB: 24-Jan-1971 ?DOA: 06/23/2021 ?DOS: the patient was seen and examined on 06/23/2021 ?PCP: [name], MD  ?Patient coming from: Home ? ?Chief Complaint:  ?Chief Complaint  ?Patient presents with  ? Weakness  ? ?HPI: [name]. is a 51 y.o. male with medical history significant for end-stage renal disease on hemodialysis (T/TH/S), diabetes mellitus, history of CVA, hypertension, GERD who presents to the ER via EMS for evaluation after he fell at home.  ?Patient's wife states that he started having involuntary jerking movements involving his extremities over the last 1 week.  He has had several falls related to these jerking movements but denies hitting his head or having any loss of consciousness.  ?He fell at home this morning while getting ready to go to the dialysis center and was brought into the ER for evaluation. ?He denies having any nausea, no vomiting, no diarrhea, no fever, no chills, no chest pain, no shortness of breath, no cough, no dizziness or lightheadedness. ?Review of Systems: As mentioned in the history of present illness. All other systems reviewed and are negative. ?Past Medical History:  ?Diagnosis Date  ? Anxiety   ? Cellulitis   ? CHF (congestive heart failure) ([place])   ? Chronic diarrhea   ? Chronic kidney disease   ? Chronic pain of both ankles   ? Diabetes mellitus without complication ([place])   ? Diabetes mellitus, type II ([place])   ? Diabetic neuropathy ([place])   ? Esophagitis   ? Frequent falls   ? Gait instability   ? Gastritis   ? GERD (gastroesophageal reflux disease)   ? H/O enucleation of left eyeball   ? Heart palpitations   ? Hemodialysis patient [facility])   ? History of kidney stones   ? HLD (hyperlipidemia)   ? Hypertension   ? Insomnia   ? Nausea and vomiting in adult   ? recurrent  ? ?Past Surgical History:  ?Procedure Laterality Date  ? AMPUTATION Left 03/16/2016  ? Procedure: AMPUTATION DIGIT LEFT  HALLUX;  Surgeon: Trula Slade, DPM;  Location: [place];  Service: Podiatry;  Laterality: Left;  can start around 5   ? AMPUTATION Right 02/09/2021  ? Procedure: AMPUTATION DIGIT;  Surgeon: Sherilyn Cooter, MD;  Location: [place];  Service: Orthopedics;  Laterality: Right;  ? ARTHROSCOPIC REPAIR ACL Left   ? AV FISTULA PLACEMENT Right 05/31/2019  ? Procedure: Brachiocephalic AV fistula creation;  Surgeon: Algernon Huxley, MD;  Location: [name] ORS;  Service: Vascular;  Laterality: Right;  ? COLONOSCOPY WITH PROPOFOL N/A 10/28/2015  ? Procedure: COLONOSCOPY WITH PROPOFOL;  Surgeon: Lollie Sails, MD;  Location[facility] ENDOSCOPY;  Service: Endoscopy;  Laterality: N/A;  ? COLONOSCOPY WITH PROPOFOL N/A 10/29/2015  ? Procedure: COLONOSCOPY WITH PROPOFOL;  Surgeon: Lollie Sails, MD;  Location: [facility] ENDOSCOPY;  Service: Endoscopy;  Laterality: N/A;  ? CORONARY/GRAFT ACUTE MI REVASCULARIZATION N/A 02/26/2020  ? Procedure: Coronary/Graft Acute MI Revascularization;  Surgeon: Jettie Booze, MD;  Location: [place] CV LAB;  Service: Cardiovascular;  Laterality: N/A;  ? DIALYSIS/PERMA CATHETER INSERTION Right 04/26/2019  ? Perm Cath   ? DIALYSIS/PERMA CATHETER INSERTION N/A 04/26/2019  ? Procedure: DIALYSIS/PERMA CATHETER INSERTION;  Surgeon: Algernon Huxley, MD;  Location: [place] CV LAB;  Service: Cardiovascular;  Laterality: N/A;  ? DIALYSIS/PERMA CATHETER INSERTION N/A 05/25/2021  ? Procedure: DIALYSIS/PERMA CATHETER INSERTION;  Surgeon: Algernon Huxley, MD;  Location: [place] CV  LAB;  Service: Cardiovascular;  Laterality: N/A;  ? DIALYSIS/PERMA CATHETER REMOVAL N/A 08/15/2019  ? Procedure: DIALYSIS/PERMA CATHETER REMOVAL;  Surgeon: Katha Cabal, MD;  Location: [place] CV LAB;  Service: Cardiovascular;  Laterality: N/A;  ? EMBOLIZATION Right 02/06/2021  ? Procedure: EMBOLIZATION;  Surgeon: Algernon Huxley, MD;  Location: [place] CV LAB;  Service: Cardiovascular;  Laterality: Right;  Right Upper  Extremity Dialysis Access, Permcath Placement  ? ESOPHAGOGASTRODUODENOSCOPY (EGD) WITH PROPOFOL N/A 12/27/2017  ? Procedure: ESOPHAGOGASTRODUODENOSCOPY (EGD) WITH PROPOFOL;  Surgeon: Toledo, Benay Pike, MD;  Location: [name] ENDOSCOPY;  Service: Gastroenterology;  Laterality: N/A;  ? INTRAVASCULAR ULTRASOUND/IVUS N/A 02/26/2020  ? Procedure: Intravascular Ultrasound/IVUS;  Surgeon: Jettie Booze, MD;  Location: [place] CV LAB;  Service: Cardiovascular;  Laterality: N/A;  ? LEFT HEART CATH AND CORONARY ANGIOGRAPHY N/A 02/26/2020  ? Procedure: LEFT HEART CATH AND CORONARY ANGIOGRAPHY;  Surgeon: Jettie Booze, MD;  Location: [place] [facility] LAB;  Service: Cardiovascular;  Laterality: N/A;  ? PROSTATE SURGERY  2016  ? TONSILECTOMY/ADENOIDECTOMY WITH MYRINGOTOMY    ? TONSILLECTOMY    ? UPPER EXTREMITY ANGIOGRAPHY Right 11/26/2020  ? Procedure: Upper Extremity Angiography;  Surgeon: Algernon Huxley, MD;  Location: [place] CV LAB;  Service: Cardiovascular;  Laterality: Right;  ? UPPER EXTREMITY ANGIOGRAPHY Right 02/05/2021  ? Procedure: UPPER EXTREMITY ANGIOGRAPHY;  Surgeon: Algernon Huxley, MD;  Location: [place] [facility];  Service: Cardiovascular;  Laterality: Right;  ? ?Social History:  reports that he has never smoked. He has never used smokeless tobacco. He reports that he does not drink alcohol and does not use drugs. ? ?Allergies  ?Allergen Reactions  ? Levofloxacin Swelling  ?  Facial swelling  ? Promethazine Diarrhea and Other (See Comments)  ?  Muscle cramps  ? Malt Other (See Comments)  ?  Cramping  ? ? ?Family History  ?Problem Relation Age of Onset  ? CAD Father   ? Stroke Father   ? Diabetes Mellitus II Mother   ? Kidney failure Mother   ? Schizophrenia Mother   ? ? ?Prior to Admission medications   ?Medication Sig Start Date End Date Taking? Authorizing Provider  ?acetaminophen (TYLENOL) 325 MG tablet Take 1-2 tablets (325-650 mg total) by mouth every 4 (four) hours as needed for mild pain.  05/04/21   Love, Ivan Anchors, PA-C  ?amiodarone (PACERONE) 200 MG tablet TAKE 1 TABLET BY MOUTH ONCE DAILY. 06/04/21   Kirsteins, Luanna Salk, MD  ?amLODipine (NORVASC) 5 MG tablet Take 5 mg by mouth daily. 06/22/21   [provider]  ?apixaban (ELIQUIS) 5 MG TABS tablet Take 1 tablet (5 mg total) by mouth 2 (two) times daily. ?Patient taking differently: Take 2.5 mg by mouth 2 (two) times daily. 05/04/21   Love, Ivan Anchors, PA-C  ?atorvastatin (LIPITOR) 80 MG tablet Take 1 tablet (80 mg total) by mouth daily. 05/04/21 04/29/22  Bary Leriche, PA-C  ?calcium acetate (PHOSLO) 667 MG capsule Take 2 capsules (1,334 mg total) by mouth 3 (three) times daily with meals. 05/04/21   Love, Ivan Anchors, PA-C  ?carvedilol (COREG) 25 MG tablet Take 25 mg by mouth 2 (two) times daily. 06/16/21   [provider]  ?cloNIDine (CATAPRES) 0.1 MG tablet Take 1 tablet (0.1 mg total) by mouth 3 (three) times daily as needed (systolic blood pressure (top number) is Greater than 200 or DBP (lower number)is Greater than 110). 05/04/21   Love, Ivan Anchors, PA-C  ?clopidogrel (PLAVIX) 75  MG tablet Take 1 tablet (75 mg total) by mouth daily. 05/04/21   Love, Ivan Anchors, PA-C  ?cyclobenzaprine (FLEXERIL) 10 MG tablet Take 10 mg by mouth 3 (three) times daily. 06/09/21   [provider]  ?dorzolamide-timolol (COSOPT) 22.3-6.8 MG/ML ophthalmic solution Place 1 drop into the right eye 2 (two) times daily.    [provider]  ?ELIQUIS 2.5 MG TABS tablet Take 2.5 mg by mouth 2 (two) times daily. 06/09/21   [provider]  ?erythromycin ophthalmic ointment SMARTSIG:In Eye(s) 06/19/21   [provider]  ?hydrocortisone cream 1 % Apply topically 2 (two) times daily. 05/04/21   Love, Ivan Anchors, PA-C  ?lactobacillus (FLORANEX/LACTINEX) PACK Take 1 packet (1 g total) by mouth 3 (three) times daily with meals. 05/04/21   Love, Ivan Anchors, PA-C  ?melatonin 5 MG TABS Take 1 tablet (5 mg total) by mouth at bedtime as needed. 05/04/21    Love, Ivan Anchors, PA-C  ?metoprolol tartrate (LOPRESSOR) 25 MG tablet Take 1 tablet (25 mg total) by mouth 2 (two) times daily. 05/04/21   Love, Ivan Anchors, PA-C  ?neomycin-polymyxin b-dexamethasone (MAXITROL) 3.5-10000-0.1 O

## 2021-06-23 NOTE — ED Notes (Signed)
Transport here for pt, Dr. Malen Gauze speaking to wife and pt at this time.  ?

## 2021-06-23 NOTE — Progress Notes (Signed)
Report called to RN on [name]. Patient to be transferred there from MRI ?

## 2021-06-23 NOTE — ED Notes (Signed)
Report given to Dialysis RN.

## 2021-06-23 NOTE — Assessment & Plan Note (Signed)
Patient has known end-stage renal disease on dialysis days are T/TH/S ?For renal replacement therapy today ?Consult nephrology ?

## 2021-06-23 NOTE — Consult Note (Signed)
Neurology Consultation ? ?Reason for Consult: Frequent body twitching ?Referring Physician: Dr. Francine Graven ? ?CC: Whole body twitching/jerking ? ?History is obtained from: Patient, wife, chart ? ?HPI: [name]. is a 51 y.o. male extensive cerebrovascular risk factor history including diabetes with diabetic neuropathy and other complications of peripheral vascular disease, hypertension, hyperlipidemia, ESRD on dialysis, congestive heart failure, prior strokes, A-fib on Eliquis, coming in for evaluation of frequent jerky movements of the whole body this been going on for a few weeks now.  He says that he has been having off-and-on jerking of his whole body which has worsened over the past week to the point where it happens without any warning and he has had multiple falls because of this. ?The wife reports that he had a heart attack a couple months ago due to a blockage in his heart, prior to that he had a stroke and he had another stroke in December ?He was seen by inpatient neurology in February 2023 with MRI showing patchy acute cortically based infarcts within the bilateral high frontal lobes and right parietal lobe predominantly in a watershed distribution along with a subcentimeter acute infarct within the callosal splenium on the left.  Eliquis held in the acute phase and then resumed.  Had a small punctate acute infarct in the left hippocampus in December 2022 ?MR angiography of the head December 2022 with no proximal intracranial vessel occlusion or significant stenosis. ?CT angiography head and neck in February 2023 when he was seen as an acute code stroke was a limited study due to poor arterial opacification but there was no evidence of large vessel occlusion to the level of the MCA bifurcations. ? ?I spoke with his wife in detail who provided above history.  She is concerned that he gets complications every time he gets dialyzed.  His strokes happened while in dialysis.  He has worsening of whole  body jerking has also been happening in dialysis.  His heart attack also happen in dialysis. ?I specifically asked for any history of cardiac arrest-she did not think that the patient ever had a cardiac arrest ? ?LKW: 2 weeks ago ?tpa given?: no, outside the window ?Premorbid modified Rankin scale (mRS): 2-3 ? ?ROS: Full ROS was performed and is negative except as noted in the HPI.  ? ?Past Medical History:  ?Diagnosis Date  ? Anxiety   ? Cellulitis   ? CHF (congestive heart failure) ([place])   ? Chronic diarrhea   ? Chronic kidney disease   ? Chronic pain of both ankles   ? Diabetes mellitus without complication ([place])   ? Diabetes mellitus, type II ([place])   ? Diabetic neuropathy ([place])   ? Esophagitis   ? Frequent falls   ? Gait instability   ? Gastritis   ? GERD (gastroesophageal reflux disease)   ? H/O enucleation of left eyeball   ? Heart palpitations   ? Hemodialysis patient [facility])   ? History of kidney stones   ? HLD (hyperlipidemia)   ? Hypertension   ? Insomnia   ? Nausea and vomiting in adult   ? recurrent  ? ? ? ?  ? ?Family History  ?Problem Relation Age of Onset  ? CAD Father   ? Stroke Father   ? Diabetes Mellitus II Mother   ? Kidney failure Mother   ? Schizophrenia Mother   ? ? ? ?Social History:  ? reports that he has never smoked. He has never used smokeless tobacco. He reports that  he does not drink alcohol and does not use drugs. ? ?Medications ? ?Current Facility-Administered Medications:  ?  0.9 %  sodium chloride infusion, 250 mL, Intravenous, PRN, Agbata, Tochukwu, MD ?  acetaminophen (TYLENOL) tablet 650 mg, 650 mg, Oral, Q6H PRN **OR** acetaminophen (TYLENOL) suppository 650 mg, 650 mg, Rectal, Q6H PRN, Agbata, Tochukwu, MD ?  amiodarone (PACERONE) tablet 200 mg, 200 mg, Oral, Daily, Agbata, Tochukwu, MD ?  amLODipine (NORVASC) tablet 5 mg, 5 mg, Oral, Daily, Agbata, Tochukwu, MD ?  apixaban (ELIQUIS) tablet 2.5 mg, 2.5 mg, Oral, BID, Agbata, Tochukwu, MD ?  atorvastatin (LIPITOR) tablet 80 mg, 80  mg, Oral, Daily, Agbata, Tochukwu, MD ?  calcium acetate (PHOSLO) capsule 1,334 mg, 1,334 mg, Oral, TID WC, Agbata, Tochukwu, MD ?  carvedilol (COREG) tablet 25 mg, 25 mg, Oral, BID, Agbata, Tochukwu, MD ?  [START ON 06/24/2021] Chlorhexidine Gluconate Cloth 2 % PADS 6 each, 6 each, Topical, Q0600, Breeze, Shantelle, NP ?  cloNIDine (CATAPRES) tablet 0.1 mg, 0.1 mg, Oral, TID PRN, Agbata, Tochukwu, MD ?  clopidogrel (PLAVIX) tablet 75 mg, 75 mg, Oral, Daily, Agbata, Tochukwu, MD ?  cyclobenzaprine (FLEXERIL) tablet 10 mg, 10 mg, Oral, TID, Agbata, Tochukwu, MD ?  [START ON 06/24/2021] dorzolamide-timolol (COSOPT) 22.3-6.8 MG/ML ophthalmic solution 1 drop, 1 drop, Right Eye, BID, Agbata, Tochukwu, MD ?  LORazepam (ATIVAN) injection 0.5 mg, 0.5 mg, Intravenous, Q8H PRN, Agbata, Tochukwu, MD ?  melatonin tablet 5 mg, 5 mg, Oral, QHS PRN, Agbata, Tochukwu, MD ?  nitroGLYCERIN (NITROSTAT) SL tablet 0.4 mg, 0.4 mg, Sublingual, Q5 min PRN, Agbata, Tochukwu, MD ?  [START ON 06/24/2021] phenylephrine ((USE for PREPARATION-H)) 0.25 % suppository 1 suppository, 1 suppository, Rectal, Daily, Agbata, Tochukwu, MD ?  prednisoLONE acetate (PRED FORTE) 1 % ophthalmic suspension 1 drop, 1 drop, Left Eye, Q4H, Agbata, Tochukwu, MD ?  sodium chloride flush (NS) 0.9 % injection 3 mL, 3 mL, Intravenous, Q12H, Agbata, Tochukwu, MD ?  sodium chloride flush (NS) 0.9 % injection 3 mL, 3 mL, Intravenous, PRN, Agbata, Tochukwu, MD ?  tamsulosin (FLOMAX) capsule 0.4 mg, 0.4 mg, Oral, Daily, Agbata, Tochukwu, MD ? ?Current Outpatient Medications:  ?  acetaminophen (TYLENOL) 325 MG tablet, Take 1-2 tablets (325-650 mg total) by mouth every 4 (four) hours as needed for mild pain., Disp: , Rfl:  ?  amiodarone (PACERONE) 200 MG tablet, TAKE 1 TABLET BY MOUTH ONCE DAILY., Disp: 30 tablet, Rfl: 0 ?  amLODipine (NORVASC) 5 MG tablet, Take 5 mg by mouth daily., Disp: , Rfl:  ?  atorvastatin (LIPITOR) 80 MG tablet, Take 1 tablet (80 mg total) by mouth daily.,  Disp: 30 tablet, Rfl: 0 ?  calcium acetate (PHOSLO) 667 MG capsule, Take 2 capsules (1,334 mg total) by mouth 3 (three) times daily with meals., Disp: 180 capsule, Rfl: 0 ?  carvedilol (COREG) 25 MG tablet, Take 25 mg by mouth 2 (two) times daily., Disp: , Rfl:  ?  cloNIDine (CATAPRES) 0.1 MG tablet, Take 1 tablet (0.1 mg total) by mouth 3 (three) times daily as needed (systolic blood pressure (top number) is Greater than 200 or DBP (lower number)is Greater than 110)., Disp: 30 tablet, Rfl: 0 ?  clopidogrel (PLAVIX) 75 MG tablet, Take 1 tablet (75 mg total) by mouth daily., Disp: 30 tablet, Rfl: 0 ?  cyclobenzaprine (FLEXERIL) 10 MG tablet, Take 10 mg by mouth 3 (three) times daily., Disp: , Rfl:  ?  dorzolamide-timolol (COSOPT) 22.3-6.8 MG/ML ophthalmic solution, Place 1 drop into the  right eye 2 (two) times daily., Disp: , Rfl:  ?  ELIQUIS 2.5 MG TABS tablet, Take 2.5 mg by mouth 2 (two) times daily., Disp: , Rfl:  ?  erythromycin ophthalmic ointment, SMARTSIG:In Eye(s), Disp: , Rfl:  ?  lactobacillus (FLORANEX/LACTINEX) PACK, Take 1 packet (1 g total) by mouth 3 (three) times daily with meals., Disp: 90 packet, Rfl: 0 ?  melatonin 5 MG TABS, Take 1 tablet (5 mg total) by mouth at bedtime as needed., Disp: 30 tablet, Rfl: 0 ?  neomycin-polymyxin b-dexamethasone (MAXITROL) 3.5-10000-0.1 OINT, Place 1 application. into the left eye in the morning and at bedtime., Disp: , Rfl:  ?  phenylephrine (,USE FOR PREPARATION-H,) 0.25 % suppository, Place 1 suppository rectally 2 (two) times daily as needed for hemorrhoids., Disp: 12 suppository, Rfl: 0 ?  prednisoLONE acetate (PRED FORTE) 1 % ophthalmic suspension, Place 1 drop into the left eye every 4 (four) hours., Disp: 5 mL, Rfl: 0 ?  PREPARATION H 0.25-88.44 % suppository, Place rectally., Disp: , Rfl:  ?  tamsulosin (FLOMAX) 0.4 MG CAPS capsule, Take 0.4 mg by mouth daily., Disp: , Rfl:  ?  witch hazel-glycerin (TUCKS) pad, Apply topically as needed for itching., Disp:  40 each, Rfl: 12 ?  apixaban (ELIQUIS) 5 MG TABS tablet, Take 1 tablet (5 mg total) by mouth 2 (two) times daily. (Patient not taking: Reported on 06/23/2021), Disp: 60 tablet, Rfl: 0 ?  hydrocortison

## 2021-06-23 NOTE — ED Notes (Signed)
Paper consent filed and placed with pt chart ?

## 2021-06-23 NOTE — ED Provider Notes (Signed)
? ?[facility] ?Provider Note ? ? ? Event Date/Time  ? First MD Initiated Contact with Patient 06/23/21 1400   ?  (approximate) ? ? ?History  ? ?Weakness ? ? ?HPI ? ?[name] [name]. is a 51 y.o. male who was brought in by his wife.  Reportedly he was twitching too much today to get in the car to go to dialysis and missed dialysis.  Patient's wife reports he started twitching about 3 months ago after he apparently had had a stroke.  This is progressively worsened.  Patient was not unconscious even today when he could not get in the car.  His mental status does not appear to change with twitching.  He certainly was awake and alert while he was twitching when I saw him.  I discussed his case with neurology who feels that is likely due to his uremia.  I then discussed him with the renal doctor and hospitalist.  We will get him in the hospital get him dialyzed if he continues twitching and we can get work-up for possible seizures. ? ?  ? ? ?Physical Exam  ? ?Triage Vital Signs: ?ED Triage Vitals  ?Enc Vitals Group  ?   BP 06/23/21 1113 (!) 185/92  ?   Pulse Rate 06/23/21 1113 66  ?   Resp 06/23/21 1113 18  ?   Temp 06/23/21 1113 97.6 ?F (36.4 ?C)  ?   Temp Source 06/23/21 1113 Oral  ?   SpO2 06/23/21 1113 95 %  ?   Weight 06/23/21 1114 175 lb (79.4 kg)  ?   Height 06/23/21 1114 6\' 3"  (1.905 m)  ?   Head Circumference --   ?   Peak Flow --   ?   Pain Score 06/23/21 1114 0  ?   Pain Loc --   ?   Pain Edu? --   ?   Excl. in [place]? --   ? ? ?Most recent vital signs: ?Vitals:  ? 06/23/21 1615 06/23/21 1630  ?BP: (!) 199/104 (!) 210/108  ?Pulse: 68 71  ?Resp: 12 12  ?Temp:    ?SpO2: 99% 98%  ? ? ? ?General: Awake, alert and oriented, no distress.  ?CV:  Good peripheral perfusion.  Heart regular rate and rhythm no audible murmur ?Resp:  Normal effort.  Lungs sound clear ?Abd:  No distention.  Soft and nontender ?Patient having some occasional apparent muscle spasms and twitching.   ? ? ?ED Results /  Procedures / Treatments  ? ?Labs ?(all labs ordered are listed, but only abnormal results are displayed) ?Labs Reviewed  ?BASIC METABOLIC PANEL - Abnormal; Notable for the following components:  ?    Result Value  ? Potassium 6.7 (*)   ? CO2 18 (*)   ? BUN 84 (*)   ? Creatinine, Ser 11.76 (*)   ? Calcium 8.8 (*)   ? GFR, Estimated 5 (*)   ? All other components within normal limits  ?CBC - Abnormal; Notable for the following components:  ? Hemoglobin 11.8 (*)   ? MCV 75.0 (*)   ? MCH 22.2 (*)   ? MCHC 29.6 (*)   ? RDW 21.0 (*)   ? All other components within normal limits  ?URINALYSIS, ROUTINE W REFLEX MICROSCOPIC  ?POTASSIUM  ?CBG MONITORING, ED  ? ? ? ?EKG ?EKG read interpreted by me shows normal sinus rhythm rate of 66 left axis no acute changes QRS duration is 112 ms. ? ? ? ?RADIOLOGY ?  CT of the head read by radiology reviewed by me shows some soft tissue thickening about the posterior paramidline occiput thought to be chronic hematoma but this has been present apparently since 2003. ? ? ?PROCEDURES: ? ?Critical Care performed:  ? ?Procedures ? ? ?MEDICATIONS ORDERED IN ED: ?Medications  ?Chlorhexidine Gluconate Cloth 2 % PADS 6 each (has no administration in time range)  ?amiodarone (PACERONE) tablet 200 mg (has no administration in time range)  ?atorvastatin (LIPITOR) tablet 80 mg (has no administration in time range)  ?carvedilol (COREG) tablet 25 mg (has no administration in time range)  ?amLODipine (NORVASC) tablet 5 mg (has no administration in time range)  ?cloNIDine (CATAPRES) tablet 0.1 mg (has no administration in time range)  ?nitroGLYCERIN (NITROSTAT) SL tablet 0.4 mg (has no administration in time range)  ?calcium acetate (PHOSLO) capsule 1,334 mg (has no administration in time range)  ?tamsulosin (FLOMAX) capsule 0.4 mg (has no administration in time range)  ?clopidogrel (PLAVIX) tablet 75 mg (has no administration in time range)  ?apixaban (ELIQUIS) tablet 2.5 mg (has no administration in time  range)  ?melatonin tablet 5 mg (has no administration in time range)  ?cyclobenzaprine (FLEXERIL) tablet 10 mg (has no administration in time range)  ?dorzolamide-timolol (COSOPT) 22.3-6.8 MG/ML ophthalmic solution 1 drop (has no administration in time range)  ?prednisoLONE acetate (PRED FORTE) 1 % ophthalmic suspension 1 drop (has no administration in time range)  ?phenylephrine ((USE for PREPARATION-H)) 0.25 % suppository 1 suppository (has no administration in time range)  ?sodium chloride flush (NS) 0.9 % injection 3 mL (has no administration in time range)  ?sodium chloride flush (NS) 0.9 % injection 3 mL (has no administration in time range)  ?0.9 %  sodium chloride infusion (has no administration in time range)  ?acetaminophen (TYLENOL) tablet 650 mg (has no administration in time range)  ?  Or  ?acetaminophen (TYLENOL) suppository 650 mg (has no administration in time range)  ?LORazepam (ATIVAN) injection 0.5 mg (has no administration in time range)  ? ? ? ?IMPRESSION / MDM / ASSESSMENT AND PLAN / ED COURSE  ?I reviewed the triage vital signs and the nursing notes. ?Patient was seen by neurology before I was able to complete the neurological exam because I had been called away to the hypotensive lady in room 24. ? ?Patient will need to be admitted for dialysis and to address his elevated potassium. ? ?The patient is on the cardiac monitor to evaluate for evidence of arrhythmia and/or significant heart rate changes.  None were seen by me ? ? ? ? ?FINAL CLINICAL IMPRESSION(S) / ED DIAGNOSES  ? ?Final diagnoses:  ?Hyperkalemia  ?Muscle spasm  ?Actual diagnosis is hyperkalemia hyper uremia and myoclonic jerks ? ? ?Rx / DC Orders  ? ?ED Discharge Orders   ? ? None  ? ?  ? ? ? ?Note:  This document was prepared using Dragon voice recognition software and may include unintentional dictation errors. ?  ?Nena Polio, MD ?06/23/21 1635 ? ?

## 2021-06-23 NOTE — Progress Notes (Signed)
Pharmacy Antibiotic Note ? ?[name]. is a 51 y.o. male w/ ESRD on TThSa HD admitted on 06/23/2021 with aspiration pneumonia.  Pharmacy has been consulted for Unasyn dosing. ? ?Plan: ?Unasyn 3 gm q12h x 5 days per indication and ERSD HD status.  Pharmacy will continue to follow and will adjust and/or extend abx dosing if warranted. ? ?Height: 6\' 3"  (190.5 cm) ?Weight: 87.9 kg (193 lb 12.6 oz) ?IBW/kg (Calculated) : 84.5 ? ?Temp (24hrs), Avg:97.9 ?F (36.6 ?C), Min:97.6 ?F (36.4 ?C), Max:98.5 ?F (36.9 ?C) ? ?Recent Labs  ?Lab 06/23/21 ?1124  ?WBC 7.0  ?CREATININE 11.76*  ?  ?Estimated Creatinine Clearance: 8.7 mL/min (A) (by C-G formula based on SCr of 11.76 mg/dL (H)).   ? ?Allergies  ?Allergen Reactions  ? Levofloxacin Swelling  ?  Facial swelling  ? Promethazine Diarrhea and Other (See Comments)  ?  Muscle cramps  ? Malt Other (See Comments)  ?  Cramping  ? ? ?Antimicrobials this admission: ?5/03 Unasyn >> x 5 days ? ?Microbiology results: ?No labs cx currently ordered or pending at this time. ? ?Thank you for allowing pharmacy to be a part of this patient?s care. ? ?Renda Rolls, PharmD, MBA ?06/23/2021 ?11:02 PM ? ? ?

## 2021-06-23 NOTE — ED Notes (Signed)
Pt placement notified pt returned to ED  ?

## 2021-06-24 ENCOUNTER — Encounter (INDEPENDENT_AMBULATORY_CARE_PROVIDER_SITE_OTHER): Payer: Medicare HMO

## 2021-06-24 ENCOUNTER — Other Ambulatory Visit (INDEPENDENT_AMBULATORY_CARE_PROVIDER_SITE_OTHER): Payer: Medicare HMO

## 2021-06-24 ENCOUNTER — Ambulatory Visit (INDEPENDENT_AMBULATORY_CARE_PROVIDER_SITE_OTHER): Payer: Medicare HMO | Admitting: Nurse Practitioner

## 2021-06-24 DIAGNOSIS — N186 End stage renal disease: Secondary | ICD-10-CM | POA: Diagnosis not present

## 2021-06-24 DIAGNOSIS — G253 Myoclonus: Secondary | ICD-10-CM | POA: Diagnosis not present

## 2021-06-24 LAB — CBC
HCT: 38.6 % — ABNORMAL LOW (ref 39.0–52.0)
Hemoglobin: 11.8 g/dL — ABNORMAL LOW (ref 13.0–17.0)
MCH: 22.1 pg — ABNORMAL LOW (ref 26.0–34.0)
MCHC: 30.6 g/dL (ref 30.0–36.0)
MCV: 72.4 fL — ABNORMAL LOW (ref 80.0–100.0)
Platelets: 162 10*3/uL (ref 150–400)
RBC: 5.33 MIL/uL (ref 4.22–5.81)
RDW: 20.8 % — ABNORMAL HIGH (ref 11.5–15.5)
WBC: 6.4 10*3/uL (ref 4.0–10.5)
nRBC: 0 % (ref 0.0–0.2)

## 2021-06-24 LAB — BASIC METABOLIC PANEL
Anion gap: 9 (ref 5–15)
BUN: 51 mg/dL — ABNORMAL HIGH (ref 6–20)
CO2: 24 mmol/L (ref 22–32)
Calcium: 8.6 mg/dL — ABNORMAL LOW (ref 8.9–10.3)
Chloride: 103 mmol/L (ref 98–111)
Creatinine, Ser: 8.4 mg/dL — ABNORMAL HIGH (ref 0.61–1.24)
GFR, Estimated: 7 mL/min — ABNORMAL LOW (ref >60)
Glucose, Bld: 89 mg/dL (ref 70–99)
Potassium: 5.2 mmol/L — ABNORMAL HIGH (ref 3.5–5.1)
Sodium: 136 mmol/L (ref 135–145)

## 2021-06-24 LAB — HEPATITIS B SURFACE ANTIBODY,QUALITATIVE: Hep B S Ab: REACTIVE — AB

## 2021-06-24 LAB — HEPATITIS B SURFACE ANTIGEN: Hepatitis B Surface Ag: NONREACTIVE

## 2021-06-24 LAB — PHOSPHORUS: Phosphorus: 6.7 mg/dL — ABNORMAL HIGH (ref 2.5–4.6)

## 2021-06-24 LAB — MRSA NEXT GEN BY PCR, NASAL: MRSA by PCR Next Gen: NOT DETECTED

## 2021-06-24 MED ORDER — AMLODIPINE BESYLATE 5 MG PO TABS
5.0000 mg | ORAL_TABLET | Freq: Once | ORAL | Status: AC
Start: 2021-06-24 — End: 2021-06-24
  Administered 2021-06-24: 5 mg via ORAL

## 2021-06-24 MED ORDER — CLONAZEPAM 0.25 MG PO TBDP
0.5000 mg | ORAL_TABLET | Freq: Two times a day (BID) | ORAL | Status: DC
  Administered 2021-06-24 – 2021-06-27 (×7): 0.5 mg via ORAL
  Filled 2021-06-24 (×2): qty 2
  Filled 2021-06-24: qty 1
  Filled 2021-06-24 (×2): qty 2
  Filled 2021-06-24: qty 1
  Filled 2021-06-24 (×2): qty 2

## 2021-06-24 NOTE — Progress Notes (Signed)
Neurology Progress Note ? ? ?S:// ?Concern for seizure overnight-seen by telemedicine neurology for that concern as well as decreased level of consciousness and hypotension. ?It seems like might have been more like dialysis disequilibrium syndrome. ?Continues to have myoclonic jerks, although frequency appears reduced than yesterday-question if dialysis was helped ? ? ?O:// ?Current vital signs: ?BP (!) 186/98 (BP Location: Left Arm)   Pulse 78   Temp (!) 97 ?F (36.1 ?C) (Axillary)   Resp 13   Ht 6\' 3"  (1.905 m)   Wt 87.9 kg   SpO2 99%   BMI 24.22 kg/m?  ?Vital signs in last 24 hours: ?Temp:  [97 ?F (36.1 ?C)-98.5 ?F (36.9 ?C)] 97 ?F (36.1 ?C) (05/03 0600) ?Pulse Rate:  [66-89] 78 (05/03 0800) ?Resp:  [9-20] 13 (05/03 0800) ?BP: (147-256)/(90-122) 186/98 (05/03 0800) ?SpO2:  [95 %-100 %] 99 % (05/03 0800) ?Weight:  [79.4 kg-87.9 kg] 87.9 kg (05/02 2235) ?General: Awake alert oriented x3 ?HEENT: Normocephalic, left eyelid is sutured after enucleation. ?CVS regular rate rhythm ?Respiratory: Breathing well saturating normally on room air ?Abdomen nondistended nontender ?Lower extremity with 2 amputations. ?Neurologic exam ?Awake alert oriented x3 ?No dysarthria ?No aphasia ?Generalized whole body myoclonic looking jerks every 2 to 8 seconds ?Cranial nerves II to XII: Right pupil equal round reactive to light, left eye enucleated, no gaze preference or deviation, visual field full on the right eye, face appears symmetric, tongue and palate midline. ?Motor exam-antigravity strength in all fours with resisted range of motion in both shoulders due to rotator cuff injuries/tears ?Sensory exam: Glove and stocking type distribution of sensory loss which is symmetric without extinction ?No gross dysmetria but generalized body myoclonus on coordination exam. ?Unchanged exam for me. ? ?Medications ? ?Current Facility-Administered Medications:  ?  0.9 %  sodium chloride infusion, 250 mL, Intravenous, PRN, Agbata, Tochukwu,  MD ?  acetaminophen (TYLENOL) tablet 650 mg, 650 mg, Oral, Q6H PRN, 650 mg at 06/24/21 0057 **OR** acetaminophen (TYLENOL) suppository 650 mg, 650 mg, Rectal, Q6H PRN, Agbata, Tochukwu, MD ?  amiodarone (PACERONE) tablet 200 mg, 200 mg, Oral, Daily, Agbata, Tochukwu, MD, 200 mg at 06/24/21 0917 ?  amLODipine (NORVASC) tablet 5 mg, 5 mg, Oral, Daily, Agbata, Tochukwu, MD, 5 mg at 06/24/21 0916 ?  Ampicillin-Sulbactam (UNASYN) 3 g in sodium chloride 0.9 % 100 mL IVPB, 3 g, Intravenous, Q12H, Belue, Alver Sorrow, RPH, Last Rate: 200 mL/hr at 06/24/21 0106, 3 g at 06/24/21 0106 ?  apixaban (ELIQUIS) tablet 2.5 mg, 2.5 mg, Oral, BID, Agbata, Tochukwu, MD, 2.5 mg at 06/24/21 0916 ?  atorvastatin (LIPITOR) tablet 80 mg, 80 mg, Oral, Daily, Agbata, Tochukwu, MD, 80 mg at 06/24/21 0917 ?  calcium acetate (PHOSLO) capsule 1,334 mg, 1,334 mg, Oral, TID WC, Agbata, Tochukwu, MD ?  carvedilol (COREG) tablet 25 mg, 25 mg, Oral, BID, Agbata, Tochukwu, MD, 25 mg at 06/24/21 0917 ?  Chlorhexidine Gluconate Cloth 2 % PADS 6 each, 6 each, Topical, Q0600, Colon Flattery, NP, 6 each at 06/24/21 0510 ?  cloNIDine (CATAPRES) tablet 0.1 mg, 0.1 mg, Oral, TID PRN, Agbata, Tochukwu, MD, 0.1 mg at 06/24/21 0101 ?  clopidogrel (PLAVIX) tablet 75 mg, 75 mg, Oral, Daily, Agbata, Tochukwu, MD, 75 mg at 06/24/21 0917 ?  cyclobenzaprine (FLEXERIL) tablet 10 mg, 10 mg, Oral, TID, Agbata, Tochukwu, MD, 10 mg at 06/24/21 0916 ?  dorzolamide-timolol (COSOPT) 22.3-6.8 MG/ML ophthalmic solution 1 drop, 1 drop, Right Eye, BID, Agbata, Tochukwu, MD ?  LORazepam (ATIVAN) injection 0.5 mg,  0.5 mg, Intravenous, Q8H PRN, Agbata, Tochukwu, MD ?  melatonin tablet 5 mg, 5 mg, Oral, QHS PRN, Agbata, Tochukwu, MD ?  nitroGLYCERIN (NITROSTAT) SL tablet 0.4 mg, 0.4 mg, Sublingual, Q5 min PRN, Agbata, Tochukwu, MD ?  phenylephrine ((USE for PREPARATION-H)) 0.25 % suppository 1 suppository, 1 suppository, Rectal, Daily, Agbata, Tochukwu, MD ?  prednisoLONE acetate (PRED  FORTE) 1 % ophthalmic suspension 1 drop, 1 drop, Left Eye, Q4H, Agbata, Tochukwu, MD, 1 drop at 06/24/21 0920 ?  sodium chloride flush (NS) 0.9 % injection 3 mL, 3 mL, Intravenous, Q12H, Agbata, Tochukwu, MD ?  sodium chloride flush (NS) 0.9 % injection 3 mL, 3 mL, Intravenous, PRN, Agbata, Tochukwu, MD ?  tamsulosin (FLOMAX) capsule 0.4 mg, 0.4 mg, Oral, Daily, Agbata, Tochukwu, MD, 0.4 mg at 06/24/21 0916 ?Labs ?CBC ?   ?Component Value Date/Time  ? WBC 6.4 06/24/2021 0321  ? RBC 5.33 06/24/2021 0321  ? HGB 11.8 (L) 06/24/2021 0321  ? HCT 38.6 (L) 06/24/2021 0321  ? PLT 162 06/24/2021 0321  ? MCV 72.4 (L) 06/24/2021 0321  ? MCH 22.1 (L) 06/24/2021 0321  ? MCHC 30.6 06/24/2021 0321  ? RDW 20.8 (H) 06/24/2021 0321  ? LYMPHSABS 2.2 04/30/2021 0827  ? MONOABS 0.8 04/30/2021 0827  ? EOSABS 0.4 04/30/2021 0827  ? BASOSABS 0.1 04/30/2021 0827  ? ? ?CMP  ?   ?Component Value Date/Time  ? NA 136 06/24/2021 0321  ? K 5.2 (H) 06/24/2021 0321  ? CL 103 06/24/2021 0321  ? CO2 24 06/24/2021 0321  ? GLUCOSE 89 06/24/2021 0321  ? BUN 51 (H) 06/24/2021 0321  ? CREATININE 8.40 (H) 06/24/2021 0321  ? CALCIUM 8.6 (L) 06/24/2021 0321  ? PROT 7.0 06/23/2021 2238  ? ALBUMIN 3.5 06/23/2021 2238  ? AST 15 06/23/2021 2238  ? ALT 9 06/23/2021 2238  ? ALKPHOS 84 06/23/2021 2238  ? BILITOT 1.1 06/23/2021 2238  ? GFRNONAA 7 (L) 06/24/2021 0321  ? GFRAA 5 (L) 08/27/2019 1540  ? ? ?glycosylated hemoglobin ? ?Lipid Panel  ?   ?Component Value Date/Time  ? CHOL 132 04/22/2021 0515  ? TRIG 74 04/22/2021 0515  ? HDL 48 04/22/2021 0515  ? CHOLHDL 2.8 04/22/2021 0515  ? VLDL 15 04/22/2021 0515  ? [place] 69 04/22/2021 0515  ? LDLCALC 102 (H) 04/25/2019 1159  ? ? ? ?Imaging ?I have reviewed images in epic and the results pertinent to this consultation are: ?MRI brain negative for acute infarct.  Evolution of strokes seen in the previous MRI. ? ?Neurodiagnostics ?EEG: Pending ? ?Assessment:  ?51 year old with extensive cerebrovascular risk factor  history presenting with complaints of jerky movements of his body that appear clinically as  polymyoclonus ?Due to the history of strokes, MRI was repeated which was negative for stroke. ?He has no history of anoxic brain injury for me to think that this is Berneda Rose syndrome.  He is not on medication such as gabapentin which can also cause the symptoms. ?Etiology of his polymyoclonus is likely metabolic derangements as well as likely prior strokes which might be contributing to it. ?Given the overnight concern for seizure, adding an EEG also might shed some more light. ?  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  ?Recommendations: ?Continue correction of metabolic derangements per primary team and nephrology as you are. ?Ammonia levels were normal ?No need for pursuing further stroke work-up ?I would recommend adding a benzodiazepine standing doses-Klonopin 0.5 mg twice daily.  This can be increased by 0.5 mg to 1  mg/day every 3 days. ?He will need outpatient follow-up with neurology for continued care and recommendations. ?EEG pending-want to rule out underlying electrographic abnormalities ?Will follow ? ?Plan relayed to Dr. Jimmye Norman via secure chat and discussed with his wife in person ? ?-- ?Amie Portland, MD ?Neurologist ?[facility] ?Pager: [phone] ? ? ? ?

## 2021-06-24 NOTE — Evaluation (Signed)
Clinical/Bedside Swallow Evaluation ?Patient Details  ?Name: [name]. ?MRN: [id number] ?Date of Birth: 1970/04/22 ? ?Today's Date: 06/24/2021 ?Time: SLP Start Time (ACUTE ONLY): 7948 SLP Stop Time (ACUTE ONLY): 0165 ?SLP Time Calculation (min) (ACUTE ONLY): 60 min ? ?Past Medical History:  ?Past Medical History:  ?Diagnosis Date  ? Anxiety   ? Cellulitis   ? CHF (congestive heart failure) ([place])   ? Chronic diarrhea   ? Chronic kidney disease   ? Chronic pain of both ankles   ? Diabetes mellitus without complication ([place])   ? Diabetes mellitus, type II ([place])   ? Diabetic neuropathy ([place])   ? Esophagitis   ? Frequent falls   ? Gait instability   ? Gastritis   ? GERD (gastroesophageal reflux disease)   ? H/O enucleation of left eyeball   ? Heart palpitations   ? Hemodialysis patient [facility])   ? History of kidney stones   ? HLD (hyperlipidemia)   ? Hypertension   ? Insomnia   ? Nausea and vomiting in adult   ? recurrent  ? ?Past Surgical History:  ?Past Surgical History:  ?Procedure Laterality Date  ? AMPUTATION Left 03/16/2016  ? Procedure: AMPUTATION DIGIT LEFT HALLUX;  Surgeon: Trula Slade, DPM;  Location: [place];  Service: Podiatry;  Laterality: Left;  can start around 5   ? AMPUTATION Right 02/09/2021  ? Procedure: AMPUTATION DIGIT;  Surgeon: Sherilyn Cooter, MD;  Location: [place];  Service: Orthopedics;  Laterality: Right;  ? ARTHROSCOPIC REPAIR ACL Left   ? AV FISTULA PLACEMENT Right 05/31/2019  ? Procedure: Brachiocephalic AV fistula creation;  Surgeon: Algernon Huxley, MD;  Location: [name];  Service: Vascular;  Laterality: Right;  ? COLONOSCOPY WITH PROPOFOL N/A 10/28/2015  ? Procedure: COLONOSCOPY WITH PROPOFOL;  Surgeon: Lollie Sails, MD;  Location: [facility] ENDOSCOPY;  Service: Endoscopy;  Laterality: N/A;  ? COLONOSCOPY WITH PROPOFOL N/A 10/29/2015  ? Procedure: COLONOSCOPY WITH PROPOFOL;  Surgeon: Lollie Sails, MD;  Location: [facility] ENDOSCOPY;  Service: Endoscopy;  Laterality: N/A;  ?  CORONARY/GRAFT ACUTE MI REVASCULARIZATION N/A 02/26/2020  ? Procedure: Coronary/Graft Acute MI Revascularization;  Surgeon: Jettie Booze, MD;  Location: [place] CV LAB;  Service: Cardiovascular;  Laterality: N/A;  ? DIALYSIS/PERMA CATHETER INSERTION Right 04/26/2019  ? Perm Cath   ? DIALYSIS/PERMA CATHETER INSERTION N/A 04/26/2019  ? Procedure: DIALYSIS/PERMA CATHETER INSERTION;  Surgeon: Algernon Huxley, MD;  Location: [place] CV LAB;  Service: Cardiovascular;  Laterality: N/A;  ? DIALYSIS/PERMA CATHETER INSERTION N/A 05/25/2021  ? Procedure: DIALYSIS/PERMA CATHETER INSERTION;  Surgeon: Algernon Huxley, MD;  Location: [place] CV LAB;  Service: Cardiovascular;  Laterality: N/A;  ? DIALYSIS/PERMA CATHETER REMOVAL N/A 08/15/2019  ? Procedure: DIALYSIS/PERMA CATHETER REMOVAL;  Surgeon: Katha Cabal, MD;  Location: [place] CV LAB;  Service: Cardiovascular;  Laterality: N/A;  ? EMBOLIZATION Right 02/06/2021  ? Procedure: EMBOLIZATION;  Surgeon: Algernon Huxley, MD;  Location: [place] CV LAB;  Service: Cardiovascular;  Laterality: Right;  Right Upper Extremity Dialysis Access, Permcath Placement  ? ESOPHAGOGASTRODUODENOSCOPY (EGD) WITH PROPOFOL N/A 12/27/2017  ? Procedure: ESOPHAGOGASTRODUODENOSCOPY (EGD) WITH PROPOFOL;  Surgeon: Toledo, Benay Pike, MD;  Location: [name] ENDOSCOPY;  Service: Gastroenterology;  Laterality: N/A;  ? INTRAVASCULAR ULTRASOUND/IVUS N/A 02/26/2020  ? Procedure: Intravascular Ultrasound/IVUS;  Surgeon: Jettie Booze, MD;  Location: [place] CV LAB;  Service: Cardiovascular;  Laterality: N/A;  ? LEFT HEART CATH AND CORONARY ANGIOGRAPHY N/A 02/26/2020  ? Procedure: LEFT HEART  CATH AND CORONARY ANGIOGRAPHY;  Surgeon: Jettie Booze, MD;  Location: [place] CV LAB;  Service: Cardiovascular;  Laterality: N/A;  ? PROSTATE SURGERY  2016  ? TONSILECTOMY/ADENOIDECTOMY WITH MYRINGOTOMY    ? TONSILLECTOMY    ? UPPER EXTREMITY ANGIOGRAPHY Right 11/26/2020  ? Procedure:  Upper Extremity Angiography;  Surgeon: Algernon Huxley, MD;  Location: [place] CV LAB;  Service: Cardiovascular;  Laterality: Right;  ? UPPER EXTREMITY ANGIOGRAPHY Right 02/05/2021  ? Procedure: UPPER EXTREMITY ANGIOGRAPHY;  Surgeon: Algernon Huxley, MD;  Location: [place] CV LAB;  Service: Cardiovascular;  Laterality: Right;  ? ?HPI:  ?Pt is admitted for hyperkalemia. Pt with complaints of myoclonic twitches, new falls, and possible seizure on 06/23/21. History includes CVA, DM, HLD, ESRD on HD, HTN, heart attack.  Per Neurology note, Due to the history of strokes, MRI was repeated which was negative for stroke.  He has no history of anoxic brain injury for me to think that this is Berneda Rose syndrome.  He is not on medication such as gabapentin which can also cause the symptoms.  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  MRI: negative for acute event.  CXR: Stable cardiomegaly with mild bibasilar atelectasis.  ?  ?Assessment / Plan / Recommendation  ?Clinical Impression ? Pt appears to present w/ adequate oropharyngeal phase swallow function w/ No oropharyngeal phase dysphagia noted, No neuromuscular deficits noted. Pt consumed po trials w/ No overt, clinical s/s of aspiration during po trials. Pt appears at reduced risk for aspiration following general aspiration precautions.  ?During po trials, pt consumed all consistencies w/ no overt coughing, decline in vocal quality, or change in respiratory presentation during/post trials. O2 sats remained 98%. Oral phase appeared [facility] w/ timely bolus management, mastication, and control of bolus propulsion for A-P transfer for swallowing. Oral clearing achieved w/ all trial consistencies. OM Exam appeared [facility] w/ no unilateral weakness noted. Speech Clear. Pt fed self w/ MOD setup and support d/t shaky/jerky UEs.  ?Recommend a Regular consistency diet w/ well-Cut meats, moistened  foods; Thin liquids. Support holding cup and/or feeding d/t shaky/jerky UEs currently. Recommend general aspiration precautions, Pills WHOLE in Puree for safer, easier swallowing as needed. Education given on Pills in Puree; food consistencies and easy to eat options; general aspiration precautions. NSG to reconsult if any new needs arise. NSG agreed. ?SLP Visit Diagnosis: Dysphagia, unspecified (R13.10) ?   ?Aspiration Risk ?  (reduced following general precautions)  ?  ?Diet Recommendation   Regular consistency diet w/ well-Cut meats, moistened foods; Thin liquids. Support holding cup and/or feeding d/t shaky/jerky UEs currently. Recommend general aspiration precautions ? ?Medication Administration: Whole meds with puree (currently for safer swallowing as needed)  ?  ?Other  Recommendations Recommended Consults:  (Dietician f/u) ?Oral Care Recommendations: Oral care BID;Oral care before and after PO;Patient independent with oral care (support) ?Other Recommendations:  (n/a)   ? ?Recommendations for follow up therapy are one component of a multi-disciplinary discharge planning process, led by the attending physician.  Recommendations may be updated based on patient status, additional functional criteria and insurance authorization. ? ?Follow up Recommendations No SLP follow up  ? ? ?  ?Assistance Recommended at Discharge Set up Supervision/Assistance (shaky UEs)  ?Functional Status Assessment Patient has not had a recent decline in their functional status  ?Frequency and Duration  (n/a)  ? (n/a) ?  ?   ? ?Prognosis Prognosis  for Safe Diet Advancement: Good ?Barriers/Prognosis Comment: n/a  ? ?  ? ?Swallow Study   ?General Date of Onset: 06/23/21 ?HPI: Pt is admitted for hyperkalemia. Pt with complaints of myoclonic twitches, new falls, and possible seizure on 06/23/21. History includes CVA, DM, HLD, ESRD on HD, HTN, heart attack.  Per Neurology note, Due to the history of strokes, MRI was repeated which was negative  for stroke.  He has no history of anoxic brain injury for me to think that this is Berneda Rose syndrome.  He is not on medication such as gabapentin which can also cause the symptoms.  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

## 2021-06-24 NOTE — Consult Note (Signed)
[facility] ?[facility] TeleNeurology Consult Services ? ?Stat Consult ? ?Patient Name:   [name], [name] ?Date of Birth:   13-Aug-1970 ?Identification Number:   MRN - [id number] ?Date of Service:   06/23/2021 23:12:21 ? ?Diagnosis: ?      R25.9 - Abnormal movements ? ?Impression ?Patient is a 51 yo male with a PMH of prior ischemic strokes (bilateral frontal and right parietal in 03/2021, left hippocampus 01/2021), DM II with neuropathy, HLD, ESRD on HD, Afib on Eliquis, HTN, PVD who was admitted on 06/23/21 with progressively increasing jerking movements of his body which have progressed over the past few weeks, with associated falls. Unclear if persistent myoclonus vs seizure in the ED. Please review initial neurology consultation note 06/23/21 for description of myoclonus. On exam patient is drowsy, but readily arousable to voice. He is following commands. He is answering questions, with slight delay. No aphasia noted. Intermittent tremors of the LUE without myoclonus or myoclonic jerk. 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 MRI brain performed at approx 21:00 showed no acute ischemic changes and no acute findings. Evolving bifrontal and right parietal infarcts, seen on 03/2021 MRI. EEG is penindg. He is hypertensive with SBP 200s. Case d/w primary provider. Pending repeat bedside swallow, rec resumption of Clonidine, given risk of rebound hypertension (unclear frequency of home usage), may resume Eliquis. At this time, his current exam is not indicative of active seizures. Routine EEG is scheduled and pending for tomorrow. Prior EEG 01/2021, 04/2021 reviewed, normal routine EEGS at that time. Ammonia level normal. Most likely multifactorial encephalopathy with metabolic provoked myoclonus, however, underlying seizures cannot be completely excluded. As noted, above no overt sustained seizure activity on current exam. ? ? ?Recommendations: ?Our recommendations are outlined below. ? ?Diagnostic Studies: ?Routine  EEG ?study/results pending ? ?Additional Antithrombotic Medications: ?Antihypertensive to be resumed, as noted above Seizure precautions Ativan previously recommended by Dr Rory Percy. ? ?Nursing Recommendations: ?Telemetry, IV Fluids, avoid dextrose containing fluids, Maintain euglycemia ?Neuro checks q4 hrs x 24 hrs and then per shift ?Head of bed 30 degrees ? ?Consultations: ?Recommend Speech therapy if failed dysphagia screen ?Physical therapy/Occupational therapy ? ?DVT Prophylaxis: ?Choice of Primary Team ? ?Disposition: ?Neurology will follow ? ? ?---------------------------------------------------------------------------------------------------- ? ? ? ?Metrics: ?[facility] Notification Time: 06/23/2021 23:09:33 ?Stamp Time: 06/23/2021 23:12:21 ?Callback Response Time: 06/23/2021 23:12:41 ? ? ?CT HEAD: ? ?MRI brain reviewed ? ? ? ?---------------------------------------------------------------------------------------------------- ? ?Chief Complaint: ?AMS, abnormal movements ? ?History of Present Illness: ?Patient is a 51 year old Male. ?Patient is a 51 yo male with a PMH of prior ischemic strokes (bilateral frontal and right parietal in 03/2021, left hippocampus 01/2021), DM II with neuropathy, HLD, ESRD on HD, Afib on Eliquis, HTN, PVD who was admitted on 06/23/21 with progressively increasing jerking movements of his body which have progressed over the past few weeks, with associated falls. Neurologic history obtained from review of Dr Rory Percy, neurology consultation 06/23/21- who was able to speak with patients wife. At that time of prior neurology consultation patient had generalized brief myoclonic jerks every 2-8 seconds, patient was awake and alert. A STAT consult was called after patient arrived to the floor. Report given by ED that patient had seizures in the ED, unclear. No sustained seizure activity since arrival to the floor at 23:00. Initially lethargic unable to swallow and follow commands. Improved  mentation at the time of my exam. ?  ? ?Past Medical History: ?     Hypertension ?     Diabetes Mellitus ?  Hyperlipidemia ?     Atrial Fibrillation ?     Coronary Artery Disease ?     Stroke ?     There is no history of Seizures ? ?Medications: ? ?Anticoagulant use:  Yes Eliquis ?Antiplatelet use: Yes plavix ?Reviewed EMR for current medications ? ?Allergies:  ?Reviewed ? ?Social History: ?Patient Is: Married ?Drug Use: No ? ?Family History: ? ?There is no family history of premature cerebrovascular disease pertinent to this consultation ? ?ROS : ?14 Points Review of Systems was performed and was negative except mentioned in HPI. ? ?Past Surgical History: ?There Is No Surgical History Contributory To Today?s Visit ? ?  ?Examination: ?Vitals reviewed ? ?Neuro Exam: ?General: Oriented, Person ?Drowsy ? ?Speech: Dysarthric: ? ?Language: Intact: ? ?Face: Symmetric: ? ?Facial Sensation: Intact: ? ?Visual Fields: Intact: Right ? ?Extraocular Movements: Intact: Right ? ?Motor Exam: Moving four limb against gravity equally ? ?Coordination: mild b/l UE ataxia, also noted on initial exam ? ? ? ? ?Patient / Family was informed the Neurology Consult would occur via TeleHealth consult by way of interactive audio and video telecommunications and consented to receiving care in this manner. ? ?Patient is being evaluated for possible acute neurologic impairment and high probability of imminent or life - threatening deterioration.I spent total of 35 minutes providing care to this patient, including time for face to face visit via telemedicine, review of medical records, imaging studies and discussion of findings with providers, the patient and / or family. ? ? ?Dr Ruffin Frederick ? ? ?[facility] ?[phone] ? ?Case [id number] ?  ?

## 2021-06-24 NOTE — Progress Notes (Signed)
Eeg done 

## 2021-06-24 NOTE — Progress Notes (Signed)
instant message sent to on call provider about hypertension,  now 184/93  ,map 118. patient is now sympthomatic with a headache. appropriate to swallow.will give tylenol ?

## 2021-06-24 NOTE — Progress Notes (Incomplete)
Discussed elevated BP with NP on call. She states she will add additional meditation for this morning. ?

## 2021-06-24 NOTE — Progress Notes (Signed)
?[facility]  ?ROUNDING NOTE  ? ?Subjective:  ? ?[name]. Is a 51 year old African-American male with past medical history including diabetes, hypertension, GERD, CVA, and end-stage renal disease on hemodialysis.  Patient presents to the emergency department via EMS with complaints of a   Patient has been admitted for Hyperkalemia [E87.5] ?Muscle spasm [M62.838] ? ?Patient is known to our practice and receives outpatient dialysis treatments at [facility] on a TTS schedule, supervised by Dr. Candiss Norse.  ? ?Update: ?Patient transferred to ICU overnight due to increased involuntary jerking and hypertension ?Tremors absent at rest ?Blood pressure slightly improved ?Dialysis received yesterday, tolerated well, UF 1 L achieved and potassium corrected ? ?Objective:  ?Vital signs in last 24 hours:  ?Temp:  [97 ?F (36.1 ?C)-98.5 ?F (36.9 ?C)] 97 ?F (36.1 ?C) (05/03 0600) ?Pulse Rate:  [66-89] 78 (05/03 0800) ?Resp:  [9-20] 13 (05/03 0800) ?BP: (147-256)/(90-122) 186/98 (05/03 0800) ?SpO2:  [95 %-100 %] 99 % (05/03 0800) ?Weight:  [79.4 kg-87.9 kg] 87.9 kg (05/02 2235) ? ?Weight change:  ?Filed Weights  ? 06/23/21 1114 06/23/21 2235  ?Weight: 79.4 kg 87.9 kg  ? ? ?Intake/Output: ?I/O last 3 completed shifts: ?In: 100 [IV Piggyback:100] ?Out: 1001 [Other:1001] ?  ?Intake/Output this shift: ? No intake/output data recorded. ? ?Physical Exam: ?General: NAD, resting quietly  ?Head: Normocephalic, atraumatic. Moist oral mucosal membranes  ?Eyes: Anicteric  ?Lungs:  Clear to auscultation, normal effort  ?Heart: Regular rate and rhythm  ?Abdomen:  Soft, nontender  ?Extremities: Trace peripheral edema.  ?Neurologic: Nonfocal, moving all four extremities  ?Skin: No lesions  ?Access: Right PermCath  ? ? ?Basic Metabolic Panel: ?Recent Labs  ?Lab 06/23/21 ?1124 06/23/21 ?1558 06/23/21 ?1705 06/23/21 ?1805 06/23/21 ?2238 06/24/21 ?0321  ?NA 139  --   --   --  137 136  ?K 6.7* 6.9* 5.0 4.3 4.6 5.2*  ?CL 108  --    --   --  101 103  ?CO2 18*  --   --   --  21* 24  ?GLUCOSE 77  --   --   --  87 89  ?BUN 84*  --   --   --  46* 51*  ?CREATININE 11.76*  --   --   --  7.75* 8.40*  ?CALCIUM 8.8*  --   --   --  9.0 8.6*  ?MG  --   --   --   --  1.9  --   ? ? ? ?Liver Function Tests: ?Recent Labs  ?Lab 06/23/21 ?2238  ?AST 15  ?ALT 9  ?ALKPHOS 84  ?BILITOT 1.1  ?PROT 7.0  ?ALBUMIN 3.5  ? ?No results for input(s): LIPASE, AMYLASE in the last 168 hours. ?Recent Labs  ?Lab 06/23/21 ?2238  ?AMMONIA 24  ? ? ?CBC: ?Recent Labs  ?Lab 06/23/21 ?1124 06/24/21 ?0321  ?WBC 7.0 6.4  ?HGB 11.8* 11.8*  ?HCT 39.8 38.6*  ?MCV 75.0* 72.4*  ?PLT 165 162  ? ? ? ?Cardiac Enzymes: ?No results for input(s): CKTOTAL, CKMB, CKMBINDEX, TROPONINI in the last 168 hours. ? ?BNP: ?Invalid input(s): POCBNP ? ?CBG: ?Recent Labs  ?Lab 06/23/21 ?1127 06/23/21 ?2204 06/23/21 ?2232  ?GLUCAP 71 74 78  ? ? ? ?Microbiology: ?Results for orders placed or performed during the hospital encounter of 06/23/21  ?MRSA Next Gen by PCR, Nasal     Status: None  ? Collection Time: 06/23/21 10:30 PM  ? Specimen: Nasal Mucosa; Nasal Swab  ?Result Value Ref  Range Status  ? MRSA by PCR Next Gen NOT DETECTED NOT DETECTED Final  ?  Comment: (NOTE) ?The GeneXpert MRSA Assay (FDA approved for NASAL specimens only), ?is one component of a comprehensive MRSA colonization surveillance ?program. It is not intended to diagnose MRSA infection nor to guide ?or monitor treatment for MRSA infections. ?Test performance is not FDA approved in patients less than 2 years ?old. ?Performed at [facility], [place], ?[place] [address] ?  ? ? ?Coagulation Studies: ?No results for input(s): LABPROT, INR in the last 72 hours. ? ?Urinalysis: ?No results for input(s): COLORURINE, LABSPEC, [place], GLUCOSEU, HGBUR, BILIRUBINUR, KETONESUR, PROTEINUR, UROBILINOGEN, NITRITE, LEUKOCYTESUR in the last 72 hours. ? ?Invalid input(s): APPERANCEUR  ? ? ?Imaging: ?CT HEAD WO CONTRAST  (5MM) ? ?Result Date: 06/23/2021 ?CLINICAL DATA:  Weakness. Fell yesterday. Previous stroke with right-sided weakness. EXAM: CT HEAD WITHOUT CONTRAST TECHNIQUE: Contiguous axial images were obtained from the base of the skull through the vertex without intravenous contrast. RADIATION DOSE REDUCTION: This exam was performed according to the departmental dose-optimization program which includes automated exposure control, adjustment of the mA and/or kV according to patient size and/or use of iterative reconstruction technique. COMPARISON:  06/16/2021 FINDINGS: Brain: Significantly age advanced cerebral atrophy. Mild low density in the periventricular white matter likely related to small vessel disease. No mass lesion, hemorrhage, hydrocephalus, acute infarct, intra-axial, or extra-axial fluid collection. Vascular: No hyperdense vessel or unexpected calcification. Intracranial atherosclerosis. Skull: Persistent soft tissue thickening in the right paramidline posterior occipital region including on 01/03. Present on 06/16/2021. No skull fracture. Sinuses/Orbits: Small, hypoattenuating left globe may be prostatic. Clear paranasal sinuses and mastoid air cells. Other: None. IMPRESSION: 1. Significantly age advanced cerebral atrophy. Small vessel ischemic change in the periventricular white matter. No acute superimposed process. 2. Persistent soft tissue thickening about the posterior paramidline occiput, possibly chronic hematoma. Electronically Signed   By: Abigail Miyamoto M.D.   On: 06/23/2021 12:20  ? ?MR BRAIN WO CONTRAST ? ?Result Date: 06/23/2021 ?CLINICAL DATA:  Stroke suspected (Ped 0-17y) EXAM: MRI HEAD WITHOUT CONTRAST TECHNIQUE: Multiplanar, multiecho pulse sequences of the brain and surrounding structures were obtained without intravenous contrast. COMPARISON:  04/21/2021 FINDINGS: Brain: There are small chronic infarcts of the high frontal lobes bilaterally and right parietal lobe reflecting evolution of infarcts  seen on the prior study. No acute infarction. No evidence of hemorrhage. There is no intracranial mass, mass effect, or edema. There is no hydrocephalus or extra-axial fluid collection. Additional patchy T2 hyperintensity in the supratentorial and pontine white matter is nonspecific but may reflect similar chronic microvascular ischemic changes. Small chronic right thalamic infarct. Prominence of the ventricles and sulci reflects similar minor parenchymal volume loss. Vascular: Major vessel flow voids at the skull base are preserved. Skull and upper cervical spine: Normal marrow signal is preserved. Sinuses/Orbits: Minor mucosal thickening. Right lens replacement. Left ocular prosthesis. Other: Sella is unremarkable.  Mastoid air cells are clear. IMPRESSION: No acute infarction, hemorrhage, or mass. Expected evolution of small infarcts seen on the prior study. Stable additional chronic findings detailed above. Electronically Signed   By: Macy Mis M.D.   On: 06/23/2021 21:31  ? ?DG Chest Port 1 View ? ?Result Date: 06/23/2021 ?CLINICAL DATA:  Acute respiratory distress. EXAM: PORTABLE CHEST 1 VIEW COMPARISON:  December 08, 2020 FINDINGS: A left-sided venous catheter is seen with its distal tip noted at the junction of the superior vena cava and right atrium. Decreased lung volumes are noted with  subsequent crowding of the bronchovascular lung markings. Mild bibasilar atelectasis is seen. There is no evidence of a pleural effusion or pneumothorax. The cardiac silhouette is enlarged and unchanged in size. Multilevel degenerative changes are noted within the mid and lower thoracic spine. IMPRESSION: Stable cardiomegaly with mild bibasilar atelectasis. Electronically Signed   By: Virgina Norfolk M.D.   On: 06/23/2021 22:41   ? ? ?Medications:  ? ? sodium chloride    ? ampicillin-sulbactam (UNASYN) IV 3 g (06/24/21 0106)  ? ? amiodarone  200 mg Oral Daily  ? amLODipine  5 mg Oral Daily  ? apixaban  2.5 mg Oral BID  ?  atorvastatin  80 mg Oral Daily  ? calcium acetate  1,334 mg Oral TID WC  ? carvedilol  25 mg Oral BID  ? Chlorhexidine Gluconate Cloth  6 each Topical Q0600  ? clonazepam  0.5 mg Oral BID  ? clopidogrel  75 mg

## 2021-06-24 NOTE — Evaluation (Signed)
Physical Therapy Evaluation ?Patient Details ?Name: [name]. ?MRN: [id number] ?DOB: 1970/05/30 ?Today's Date: 06/24/2021 ? ?History of Present Illness ? Pt is admitted for hyperkalemia. Pt with complaints of myoclonic twitches, new falls, and possible seizure on 06/23/21. History includes CVA, DM, HLD, ESRD on HD, HTN, heart attack.  ?Clinical Impression ? Pt is a pleasant 51 year old male who was admitted for hyperkalemia. Pt performs bed mobility with min assist, transfers with min assist and RW, and ambulation with RW and safe technique. Pt demonstrates deficits with strength/mobility. Does exhibit jerking symptoms throughout session, however doesn't appear to limit mobility at this time. No reports of dizziness. Would benefit from skilled PT to address above deficits and promote optimal return to PLOF. Recommend transition to [place] upon discharge from acute hospitalization. ? ?Orthostatics performed: ?Supine: 153/91 ?Sit: 132/76 ?Stand: 113/72 ? ?   ? ?Recommendations for follow up therapy are one component of a multi-disciplinary discharge planning process, led by the attending physician.  Recommendations may be updated based on patient status, additional functional criteria and insurance authorization. ? ?Follow Up Recommendations Home health PT ? ?  ?Assistance Recommended at Discharge Frequent or constant Supervision/Assistance  ?Patient can return home with the following ? A little help with walking and/or transfers;A little help with bathing/dressing/bathroom ? ?  ?Equipment Recommendations Hospital bed;Wheelchair (measurements PT)  ?Recommendations for Other Services ?    ?  ?Functional Status Assessment Patient has had a recent decline in their functional status and demonstrates the ability to make significant improvements in function in a reasonable and predictable amount of time.  ? ?  ?Precautions / Restrictions Precautions ?Precautions: Fall ?Restrictions ?Weight Bearing Restrictions: No  ? ?   ? ?Mobility ? Bed Mobility ?Overal bed mobility: Needs Assistance ?Bed Mobility: Supine to Sit, Sit to Supine ?  ?  ?Supine to sit: Min assist ?Sit to supine: Min guard ?  ?General bed mobility comments: Needs slight assist with B LE and rotation of hips. Once seated at EOB, able to sit with upright posture and no LOB including dizziness. ?  ? ?Transfers ?Overall transfer level: Needs assistance ?Equipment used: Rolling walker (2 wheels) ?Transfers: Sit to/from Stand ?Sit to Stand: Min assist ?  ?  ?  ?  ?  ?General transfer comment: poor power with min assist to attempt transfer. Once standing, bracing against bed, however able to self correct. Follows commands well ?  ? ?Ambulation/Gait ?Ambulation/Gait assistance: Min guard ?Gait Distance (Feet): 80 Feet ?Assistive device: Rolling walker (2 wheels) ?Gait Pattern/deviations: Step-to pattern ?  ?  ?  ?General Gait Details: ambulated in room/hallway with step to gait pattern and slight drag of L LE. Heavy use of RW by B UEs. No dizziness noted. Veering noted towards L due to visual impairment with cues for RW management ? ?Stairs ?  ?  ?  ?  ?  ? ?Wheelchair Mobility ?  ? ?Modified Rankin (Stroke Patients Only) ?  ? ?  ? ?Balance Overall balance assessment: History of Falls, Needs assistance ?Sitting-balance support: Feet supported, Bilateral upper extremity supported ?Sitting balance-Leahy Scale: Fair ?  ?  ?Standing balance support: Bilateral upper extremity supported ?Standing balance-Leahy Scale: Fair ?  ?  ?  ?  ?  ?  ?  ?  ?  ?  ?  ?  ?   ? ? ? ?Pertinent Vitals/Pain Pain Assessment ?Pain Assessment: No/denies pain  ? ? ?Home Living Family/patient expects to be discharged to:: Private residence ?Living  Arrangements: Spouse/significant other ?Available Help at Discharge: Family;Available PRN/intermittently ?Type of Home: House ?Home Access: Ramped entrance ?  ?  ?  ?Home Layout: One level ?Home Equipment: Conservation officer, nature (2 wheels);Cane - single  point ?Additional Comments: in process of getting rollator  ?  ?Prior Function Prior Level of Function : Needs assist ?  ?  ?  ?  ?  ?  ?Mobility Comments: Pt reports he was previously using SPC in community, however with recent fall was using RW at all times. Multiple recent falls. ?ADLs Comments: wife reports she has been assisting with all ADLs ?  ? ? ?Hand Dominance  ?   ? ?  ?Extremity/Trunk Assessment  ? Upper Extremity Assessment ?Upper Extremity Assessment: Generalized weakness (L UE with decreased ROM and swelling noted. Grossly 4/5; R UE grossly WNL) ?  ? ?Lower Extremity Assessment ?Lower Extremity Assessment: Generalized weakness (L LE grossly 4/5; R LE grossly 5/5) ?  ? ?   ?Communication  ? Communication:  (very soft spoken)  ?Cognition Arousal/Alertness: Awake/alert ?Behavior During Therapy: [facility] for tasks assessed/performed ?Overall Cognitive Status: Within Functional Limits for tasks assessed ?  ?  ?  ?  ?  ?  ?  ?  ?  ?  ?  ?  ?  ?  ?  ?  ?General Comments: alert and oriented, pleasant and agreeable to therapy ?  ?  ? ?  ?General Comments   ? ?  ?Exercises Other Exercises ?Other Exercises: Once seated at EOB, able to perform lateral transfers with min/mod assist. Attempted to don socks, unable without max assist ?Other Exercises: Performed orthostatics  ? ?Assessment/Plan  ?  ?PT Assessment Patient needs continued PT services  ?PT Problem List Decreased strength;Decreased activity tolerance;Decreased balance;Decreased mobility;Decreased safety awareness ? ?   ?  ?PT Treatment Interventions Gait training;Therapeutic activities;Therapeutic exercise;Balance training   ? ?PT Goals (Current goals can be found in the Care Plan section)  ?Acute Rehab PT Goals ?Patient Stated Goal: to go home ?PT Goal Formulation: With patient ?Time For Goal Achievement: 07/08/21 ?Potential to Achieve Goals: Good ? ?  ?Frequency Min 2X/week ?  ? ? ?Co-evaluation   ?  ?  ?  ?  ? ? ?  ?AM-PAC PT "6 Clicks" Mobility  ?Outcome  Measure Help needed turning from your back to your side while in a flat bed without using bedrails?: A Little ?Help needed moving from lying on your back to sitting on the side of a flat bed without using bedrails?: A Little ?Help needed moving to and from a bed to a chair (including a wheelchair)?: A Little ?Help needed standing up from a chair using your arms (e.g., wheelchair or bedside chair)?: A Little ?Help needed to walk in hospital room?: A Little ?Help needed climbing 3-5 steps with a railing? : A Lot ?6 Click Score: 17 ? ?  ?End of Session Equipment Utilized During Treatment: Gait belt ?Activity Tolerance: Patient tolerated treatment well ?Patient left: in bed;with bed alarm set;with family/visitor present ?Nurse Communication: Mobility status ?PT Visit Diagnosis: Unsteadiness on feet (R26.81);Muscle weakness (generalized) (M62.81);History of falling (Z91.81);Difficulty in walking, not elsewhere classified (R26.2);Apraxia (R48.2) ?  ? ?Time: 9924-2683 ?PT Time Calculation (min) (ACUTE ONLY): 37 min ? ? ?Charges:   PT Evaluation ?$PT Eval Low Complexity: 1 Low ?PT Treatments ?$Gait Training: 23-37 mins ?  ?   ? ? ?Greggory Stallion, PT, DPT, GCS ?[phone] ? ? ?Dianelys Scinto ?06/24/2021, 2:57 PM ? ?

## 2021-06-24 NOTE — Progress Notes (Signed)
Asked to perform bedside swallow by neuro, if passes's may give all 2200 pos scheduled plus clonidine;';patient passed  ?

## 2021-06-24 NOTE — Procedures (Signed)
Patient Name: [name].  ?MRN: [id number]  ?Epilepsy Attending: Lora Havens  ?Referring Physician/Provider: Amie Portland, MD ?Date: 06/24/2021 ?Duration: 26.18 mins ? ?Patient history: 51 year old with extensive cerebrovascular risk factor history presenting with complaints of jerky movements of his body that appear clinically as  polymyoclonus.  EEG to evaluate for seizure. ? ?Level of alertness: Awake, asleep ? ?AEDs during EEG study: None ? ?Technical aspects: This EEG study was done with scalp electrodes positioned according to the 10-20 International system of electrode placement. Electrical activity was acquired at a sampling rate of 500Hz  and reviewed with a high frequency filter of 70Hz  and a low frequency filter of 1Hz . EEG data were recorded continuously and digitally stored.  ? ?Description: The posterior dominant rhythm consists of 8Hz  activity of moderate voltage (25-35 uV) seen predominantly in posterior head regions, symmetric and reactive to eye opening and eye closing. Sleep was characterized by vertex waves, sleep spindles (12 to 14 Hz), maximal frontocentral region.  Patient was noted to have subtle head jerks multiple times during the EEG.  Concomitant EEG before, during and after the event did not show any EEG change to suggest seizure.  Hyperventilation and photic stimulation were not performed.    ? ?IMPRESSION: ?This study is within normal limits. No seizures or epileptiform discharges were seen throughout the recording. ? ?Patient was noted to have subtle head jerks during the EEG without concomitant EEG change.  These episodes are not epileptic.  Myoclonus could have similar appearance. ? ? ?Christophe Rising [name]  ? ?

## 2021-06-24 NOTE — Progress Notes (Signed)
?PROGRESS NOTE ? ? ? ?[name].  WUX:[id number] DOB: 1970/08/20 DOA: 06/23/2021 ?PCP: [name], MD  ? ?Assessment & Plan: ?  ?Principal Problem: ?  Hyperkalemia ?Active Problems: ?  ESRD (end stage renal disease) ([place]) ?  Frequent falls ?  Myoclonus ?  Essential hypertension, benign ?  Paroxysmal atrial fibrillation (HCC) ?  CVA (cerebral vascular accident) [facility]) ? ?Assessment and Plan:Hyperkalemia ? ?Hyperkalemia: secondary to ERSD. Needs HD. Management as per nephro  ?  ?ESRD: on HD TTS. Nephro following and recs apprec  ?  ?Possible aspiration pneumonia: continue on IV unasyn, bronchodilators & encourage incentive spirometry  ? ?Frequent falls: has fallen several times in the last week. PT/OT consulted. Fall precautions ?  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 EEG is WNL and no seizures or epileptiform discharges seen throughout recording. Started on klonopin as per neuro.  ? ?ACD: likely secondary to ESRD. No need for a transfusion currently  ?  ?HTN: continue on coreg, amlodipine, clonidine  ?  ?Hx of CVA: continue on statin, plavix  ?  ?PAF: continue on coreg, amio & eliquis  ? ? ? ? ? ? ?DVT prophylaxis: eliquis  ?Code Status: full  ?Family Communication: discussed pt's care w/ pt's family at bedside and answered their questions  ?Disposition Plan: likely d/c home w/ HH  ? ?Level of care: Stepdown ? ?Status is: Observation ?The patient remains OBS appropriate and will d/c before 2 midnights. ? ? ?Consultants:  ?Nephro ?Neuro  ? ? ?Procedures:  ? ?Antimicrobials: unasyn  ? ? ?Subjective: ?[name] being hungry  ? ?Objective: ?Vitals:  ? 06/24/21 0600 06/24/21 0615 06/24/21 0630 06/24/21 0800  ?BP: (!) 179/93 (!) 197/100 (!) 188/99 (!) 186/98  ?Pulse: 78 82 80 78  ?Resp: 11 16 15 13   ?Temp: (!) 97 ?F (36.1 ?C)     ?TempSrc: Axillary     ?SpO2:    99%  ?Weight:      ?Height:      ? ? ?Intake/Output Summary (Last 24 hours) at 06/24/2021 0905 ?Last data filed at 06/24/2021  0200 ?Gross per 24 hour  ?Intake 100 ml  ?Output 1001 ml  ?Net -901 ml  ? ?Filed Weights  ? 06/23/21 1114 06/23/21 2235  ?Weight: 79.4 kg 87.9 kg  ? ? ?Examination: ? ?General exam: Appears calm and comfortable  ?Respiratory system: diminished breath sounds b/l  ?Cardiovascular system: irregularly irregular. No rubs, gallops or clicks.  ?Gastrointestinal system: Abdomen is nondistended, soft and nontender.Normal bowel sounds heard. ?Central nervous system: Alert and oriented. Moves all extremities  ?Psychiatry: Judgement and insight appear normal. Flat mood and affect ? ? ? ?Data Reviewed: I have personally reviewed following labs and imaging studies ? ?CBC: ?Recent Labs  ?Lab 06/23/21 ?1124 06/24/21 ?0321  ?WBC 7.0 6.4  ?HGB 11.8* 11.8*  ?HCT 39.8 38.6*  ?MCV 75.0* 72.4*  ?PLT 165 162  ? ?Basic Metabolic Panel: ?Recent Labs  ?Lab 06/23/21 ?1124 06/23/21 ?1558 06/23/21 ?1705 06/23/21 ?1805 06/23/21 ?2238 06/24/21 ?0321  ?NA 139  --   --   --  137 136  ?K 6.7* 6.9* 5.0 4.3 4.6 5.2*  ?CL 108  --   --   --  101 103  ?CO2 18*  --   --   --  21* 24  ?GLUCOSE 77  --   --   --  87 89  ?BUN 84*  --   --   --  46* 51*  ?CREATININE 11.76*  --   --   --  7.75* 8.40*  ?CALCIUM 8.8*  --   --   --  9.0 8.6*  ?MG  --   --   --   --  1.9  --   ? ?GFR: ?Estimated Creatinine Clearance: 12.2 mL/min (A) (by C-G formula based on SCr of 8.4 mg/dL (H)). ?Liver Function Tests: ?Recent Labs  ?Lab 06/23/21 ?2238  ?AST 15  ?ALT 9  ?ALKPHOS 84  ?BILITOT 1.1  ?PROT 7.0  ?ALBUMIN 3.5  ? ?No results for input(s): LIPASE, AMYLASE in the last 168 hours. ?Recent Labs  ?Lab 06/23/21 ?2238  ?AMMONIA 24  ? ?Coagulation Profile: ?No results for input(s): INR, PROTIME in the last 168 hours. ?Cardiac Enzymes: ?No results for input(s): CKTOTAL, CKMB, CKMBINDEX, TROPONINI in the last 168 hours. ?BNP (last 3 results) ?No results for input(s): PROBNP in the last 8760 hours. ?HbA1C: ?No results for input(s): HGBA1C in the last 72 hours. ?CBG: ?Recent Labs   ?Lab 06/23/21 ?1127 06/23/21 ?2204 06/23/21 ?2232  ?GLUCAP 71 74 78  ? ?Lipid Profile: ?No results for input(s): CHOL, HDL, LDLCALC, TRIG, CHOLHDL, LDLDIRECT in the last 72 hours. ?Thyroid Function Tests: ?No results for input(s): TSH, T4TOTAL, FREET4, T3FREE, THYROIDAB in the last 72 hours. ?Anemia Panel: ?No results for input(s): VITAMINB12, FOLATE, FERRITIN, TIBC, IRON, RETICCTPCT in the last 72 hours. ?Sepsis Labs: ?No results for input(s): PROCALCITON, LATICACIDVEN in the last 168 hours. ? ?Recent Results (from the past 240 hour(s))  ?MRSA Next Gen by PCR, Nasal     Status: None  ? Collection Time: 06/23/21 10:30 PM  ? Specimen: Nasal Mucosa; Nasal Swab  ?Result Value Ref Range Status  ? MRSA by PCR Next Gen NOT DETECTED NOT DETECTED Final  ?  Comment: (NOTE) ?The GeneXpert MRSA Assay (FDA approved for NASAL specimens only), ?is one component of a comprehensive MRSA colonization surveillance ?program. It is not intended to diagnose MRSA infection nor to guide ?or monitor treatment for MRSA infections. ?Test performance is not FDA approved in patients less than 2 years ?old. ?Performed at [facility], [place], ?[place] [address] ?  ?  ? ? ? ? ? ?Radiology Studies: ?CT HEAD WO CONTRAST (5MM) ? ?Result Date: 06/23/2021 ?CLINICAL DATA:  Weakness. Fell yesterday. Previous stroke with right-sided weakness. EXAM: CT HEAD WITHOUT CONTRAST TECHNIQUE: Contiguous axial images were obtained from the base of the skull through the vertex without intravenous contrast. RADIATION DOSE REDUCTION: This exam was performed according to the departmental dose-optimization program which includes automated exposure control, adjustment of the mA and/or kV according to patient size and/or use of iterative reconstruction technique. COMPARISON:  06/16/2021 FINDINGS: Brain: Significantly age advanced cerebral atrophy. Mild low density in the periventricular white matter likely related to small vessel disease. No  mass lesion, hemorrhage, hydrocephalus, acute infarct, intra-axial, or extra-axial fluid collection. Vascular: No hyperdense vessel or unexpected calcification. Intracranial atherosclerosis. Skull: Persistent soft tissue thickening in the right paramidline posterior occipital region including on 01/03. Present on 06/16/2021. No skull fracture. Sinuses/Orbits: Small, hypoattenuating left globe may be prostatic. Clear paranasal sinuses and mastoid air cells. Other: None. IMPRESSION: 1. Significantly age advanced cerebral atrophy. Small vessel ischemic change in the periventricular white matter. No acute superimposed process. 2. Persistent soft tissue thickening about the posterior paramidline occiput, possibly chronic hematoma. Electronically Signed   By: Abigail Miyamoto M.D.   On: 06/23/2021 12:20  ? ?MR BRAIN WO CONTRAST ? ?Result Date: 06/23/2021 ?CLINICAL DATA:  Stroke suspected (Ped 0-17y) EXAM: MRI HEAD WITHOUT  CONTRAST TECHNIQUE: Multiplanar, multiecho pulse sequences of the brain and surrounding structures were obtained without intravenous contrast. COMPARISON:  04/21/2021 FINDINGS: Brain: There are small chronic infarcts of the high frontal lobes bilaterally and right parietal lobe reflecting evolution of infarcts seen on the prior study. No acute infarction. No evidence of hemorrhage. There is no intracranial mass, mass effect, or edema. There is no hydrocephalus or extra-axial fluid collection. Additional patchy T2 hyperintensity in the supratentorial and pontine white matter is nonspecific but may reflect similar chronic microvascular ischemic changes. Small chronic right thalamic infarct. Prominence of the ventricles and sulci reflects similar minor parenchymal volume loss. Vascular: Major vessel flow voids at the skull base are preserved. Skull and upper cervical spine: Normal marrow signal is preserved. Sinuses/Orbits: Minor mucosal thickening. Right lens replacement. Left ocular prosthesis. Other: Sella is  unremarkable.  Mastoid air cells are clear. IMPRESSION: No acute infarction, hemorrhage, or mass. Expected evolution of small infarcts seen on the prior study. Stable additional chronic findings detailed above. E

## 2021-06-25 DIAGNOSIS — G253 Myoclonus: Secondary | ICD-10-CM | POA: Diagnosis not present

## 2021-06-25 DIAGNOSIS — E875 Hyperkalemia: Secondary | ICD-10-CM | POA: Diagnosis not present

## 2021-06-25 DIAGNOSIS — N186 End stage renal disease: Secondary | ICD-10-CM | POA: Diagnosis not present

## 2021-06-25 LAB — BASIC METABOLIC PANEL
Anion gap: 12 (ref 5–15)
BUN: 65 mg/dL — ABNORMAL HIGH (ref 6–20)
CO2: 23 mmol/L (ref 22–32)
Calcium: 8.3 mg/dL — ABNORMAL LOW (ref 8.9–10.3)
Chloride: 102 mmol/L (ref 98–111)
Creatinine, Ser: 10.33 mg/dL — ABNORMAL HIGH (ref 0.61–1.24)
GFR, Estimated: 5 mL/min — ABNORMAL LOW (ref >60)
Glucose, Bld: 73 mg/dL (ref 70–99)
Potassium: 5.9 mmol/L — ABNORMAL HIGH (ref 3.5–5.1)
Sodium: 137 mmol/L (ref 135–145)

## 2021-06-25 LAB — CBC
HCT: 38.7 % — ABNORMAL LOW (ref 39.0–52.0)
Hemoglobin: 11.6 g/dL — ABNORMAL LOW (ref 13.0–17.0)
MCH: 22 pg — ABNORMAL LOW (ref 26.0–34.0)
MCHC: 30 g/dL (ref 30.0–36.0)
MCV: 73.3 fL — ABNORMAL LOW (ref 80.0–100.0)
Platelets: 150 10*3/uL (ref 150–400)
RBC: 5.28 MIL/uL (ref 4.22–5.81)
RDW: 20.5 % — ABNORMAL HIGH (ref 11.5–15.5)
WBC: 8.1 10*3/uL (ref 4.0–10.5)
nRBC: 0 % (ref 0.0–0.2)

## 2021-06-25 LAB — HEPATITIS B SURFACE ANTIBODY, QUANTITATIVE: Hep B S AB Quant (Post): 519.8 m[IU]/mL (ref >9.9)

## 2021-06-25 MED ORDER — IRBESARTAN 150 MG PO TABS
150.0000 mg | ORAL_TABLET | Freq: Every day | ORAL | Status: DC
  Administered 2021-06-25 – 2021-06-27 (×2): 150 mg via ORAL
  Filled 2021-06-25 (×2): qty 1

## 2021-06-25 MED ORDER — HEPARIN SODIUM (PORCINE) 1000 UNIT/ML IJ SOLN
INTRAMUSCULAR | Status: AC
  Filled 2021-06-25: qty 10

## 2021-06-25 NOTE — Progress Notes (Signed)
?[facility]  ?ROUNDING NOTE  ? ?Subjective:  ? ?[name]. Is a 51 year old African-American male with past medical history including diabetes, hypertension, GERD, CVA, and end-stage renal disease on hemodialysis.  Patient presents to the emergency department via EMS with complaints of a   Patient has been admitted for Hyperkalemia [E87.5] ?Muscle spasm [M62.838] ? ?Patient is known to our practice and receives outpatient dialysis treatments at [facility] on a TTS schedule, supervised by Dr. Candiss Norse.  ? ?Update: ?Patient seen and evaluated during dialysis ?  ?HEMODIALYSIS FLOWSHEET: ? ?Blood Flow Rate (mL/min): 400 mL/min ?Arterial Pressure (mmHg): -240 mmHg ?Venous Pressure (mmHg): 160 mmHg ?Transmembrane Pressure (mmHg): 60 mmHg ?Ultrafiltration Rate (mL/min): 500 mL/min ?Dialysate Flow Rate (mL/min): 500 ml/min ?Conductivity: Machine : 13.6 ?Conductivity: Machine : 13.6 ?Dialysate Change: 2K ? ?No complaints at this time ?Tremors slightly improved ? ?Objective:  ?Vital signs in last 24 hours:  ?Temp:  [97.9 ?F (36.6 ?C)-98.9 ?F (37.2 ?C)] 98.9 ?F (37.2 ?C) (05/04 0240) ?Pulse Rate:  [45-76] 76 (05/04 1230) ?Resp:  [8-20] 14 (05/04 1230) ?BP: (140-202)/(82-111) 167/99 (05/04 1230) ?SpO2:  [98 %-100 %] 99 % (05/04 0725) ?Weight:  [100.3 kg-101.2 kg] 101.2 kg (05/04 0934) ? ?Weight change: 21 kg ?Filed Weights  ? 06/23/21 2235 06/24/21 2112 06/25/21 0934  ?Weight: 87.9 kg 100.3 kg 101.2 kg  ? ? ?Intake/Output: ?I/O last 3 completed shifts: ?In: 320 [P.O.:120; IV Piggyback:200] ?Out: 1001 [Other:1001] ?  ?Intake/Output this shift: ? Total I/O ?In: 120 [P.O.:120] ?Out: -  ? ?Physical Exam: ?General: NAD, resting quietly  ?Head: Normocephalic, atraumatic. Moist oral mucosal membranes  ?Eyes: Anicteric  ?Lungs:  Clear to auscultation, normal effort  ?Heart: Regular rate and rhythm  ?Abdomen:  Soft, nontender  ?Extremities: Trace peripheral edema.  ?Neurologic: Nonfocal, moving all four  extremities  ?Skin: No lesions  ?Access: Right PermCath  ? ? ?Basic Metabolic Panel: ?Recent Labs  ?Lab 06/23/21 ?1124 06/23/21 ?1558 06/23/21 ?1705 06/23/21 ?1805 06/23/21 ?2238 06/24/21 ?0321 06/25/21 ?0411  ?NA 139  --   --   --  137 136 137  ?K 6.7*   < > 5.0 4.3 4.6 5.2* 5.9*  ?CL 108  --   --   --  101 103 102  ?CO2 18*  --   --   --  21* 24 23  ?GLUCOSE 77  --   --   --  87 89 73  ?BUN 84*  --   --   --  46* 51* 65*  ?CREATININE 11.76*  --   --   --  7.75* 8.40* 10.33*  ?CALCIUM 8.8*  --   --   --  9.0 8.6* 8.3*  ?MG  --   --   --   --  1.9  --   --   ?PHOS  --   --   --   --   --  6.7*  --   ? < > = values in this interval not displayed.  ? ? ? ?Liver Function Tests: ?Recent Labs  ?Lab 06/23/21 ?2238  ?AST 15  ?ALT 9  ?ALKPHOS 84  ?BILITOT 1.1  ?PROT 7.0  ?ALBUMIN 3.5  ? ? ?No results for input(s): LIPASE, AMYLASE in the last 168 hours. ?Recent Labs  ?Lab 06/23/21 ?2238  ?AMMONIA 24  ? ? ? ?CBC: ?Recent Labs  ?Lab 06/23/21 ?1124 06/24/21 ?0321 06/25/21 ?0411  ?WBC 7.0 6.4 8.1  ?HGB 11.8* 11.8* 11.6*  ?HCT 39.8 38.6* 38.7*  ?  MCV 75.0* 72.4* 73.3*  ?PLT 165 162 150  ? ? ? ?Cardiac Enzymes: ?No results for input(s): CKTOTAL, CKMB, CKMBINDEX, TROPONINI in the last 168 hours. ? ?BNP: ?Invalid input(s): POCBNP ? ?CBG: ?Recent Labs  ?Lab 06/23/21 ?1127 06/23/21 ?2204 06/23/21 ?2232  ?GLUCAP 71 74 78  ? ? ? ?Microbiology: ?Results for orders placed or performed during the hospital encounter of 06/23/21  ?MRSA Next Gen by PCR, Nasal     Status: None  ? Collection Time: 06/23/21 10:30 PM  ? Specimen: Nasal Mucosa; Nasal Swab  ?Result Value Ref Range Status  ? MRSA by PCR Next Gen NOT DETECTED NOT DETECTED Final  ?  Comment: (NOTE) ?The GeneXpert MRSA Assay (FDA approved for NASAL specimens only), ?is one component of a comprehensive MRSA colonization surveillance ?program. It is not intended to diagnose MRSA infection nor to guide ?or monitor treatment for MRSA infections. ?Test performance is not FDA approved in  patients less than 2 years ?old. ?Performed at [name], [place], ?[place] [address] ?  ? ? ?Coagulation Studies: ?No results for input(s): LABPROT, INR in the last 72 hours. ? ?Urinalysis: ?No results for input(s): COLORURINE, LABSPEC, [place], GLUCOSEU, HGBUR, BILIRUBINUR, KETONESUR, PROTEINUR, UROBILINOGEN, NITRITE, LEUKOCYTESUR in the last 72 hours. ? ?Invalid input(s): APPERANCEUR  ? ? ?Imaging: ?MR BRAIN WO CONTRAST ? ?Result Date: 06/23/2021 ?CLINICAL DATA:  Stroke suspected (Ped 0-17y) EXAM: MRI HEAD WITHOUT CONTRAST TECHNIQUE: Multiplanar, multiecho pulse sequences of the brain and surrounding structures were obtained without intravenous contrast. COMPARISON:  04/21/2021 FINDINGS: Brain: There are small chronic infarcts of the high frontal lobes bilaterally and right parietal lobe reflecting evolution of infarcts seen on the prior study. No acute infarction. No evidence of hemorrhage. There is no intracranial mass, mass effect, or edema. There is no hydrocephalus or extra-axial fluid collection. Additional patchy T2 hyperintensity in the supratentorial and pontine white matter is nonspecific but may reflect similar chronic microvascular ischemic changes. Small chronic right thalamic infarct. Prominence of the ventricles and sulci reflects similar minor parenchymal volume loss. Vascular: Major vessel flow voids at the skull base are preserved. Skull and upper cervical spine: Normal marrow signal is preserved. Sinuses/Orbits: Minor mucosal thickening. Right lens replacement. Left ocular prosthesis. Other: Sella is unremarkable.  Mastoid air cells are clear. IMPRESSION: No acute infarction, hemorrhage, or mass. Expected evolution of small infarcts seen on the prior study. Stable additional chronic findings detailed above. Electronically Signed   By: Macy Mis M.D.   On: 06/23/2021 21:31  ? ?DG Chest Port 1 View ? ?Result Date: 06/23/2021 ?CLINICAL DATA:  Acute respiratory  distress. EXAM: PORTABLE CHEST 1 VIEW COMPARISON:  December 08, 2020 FINDINGS: A left-sided venous catheter is seen with its distal tip noted at the junction of the superior vena cava and right atrium. Decreased lung volumes are noted with subsequent crowding of the bronchovascular lung markings. Mild bibasilar atelectasis is seen. There is no evidence of a pleural effusion or pneumothorax. The cardiac silhouette is enlarged and unchanged in size. Multilevel degenerative changes are noted within the mid and lower thoracic spine. IMPRESSION: Stable cardiomegaly with mild bibasilar atelectasis. Electronically Signed   By: Virgina Norfolk M.D.   On: 06/23/2021 22:41  ? ?EEG adult ? ?Result Date: 06/24/2021 ?Lora Havens, MD     06/24/2021  2:07 PM Patient Name: [name] MRN: [id number] Epilepsy Attending: Lora Havens Referring Physician/Provider: Amie Portland, MD Date: 06/24/2021 Duration: 26.18 mins Patient history: 51 year old with extensive  cerebrovascular risk factor history presenting with complaints of jerky movements of his body that appear clinically as  polymyoclonus.  EEG to evaluate for seizure. Level of alertness: Awake, asleep AEDs during EEG study: None Technical aspects: This EEG study was done with scalp electrodes positioned according to the 10-20 International system of electrode placement. Electrical activity was acquired at a sampling rate of 500Hz  and reviewed with a high frequency filter of 70Hz  and a low frequency filter of 1Hz . EEG data were recorded continuously and digitally stored. Description: The posterior dominant rhythm consists of 8Hz  activity of moderate voltage (25-35 uV) seen predominantly in posterior head regions, symmetric and reactive to eye opening and eye closing. Sleep was characterized by vertex waves, sleep spindles (12 to 14 Hz), maximal frontocentral region.  Patient was noted to have subtle head jerks multiple times during the EEG.  Concomitant EEG before,  during and after the event did not show any EEG change to suggest seizure.  Hyperventilation and photic stimulation were not performed.   IMPRESSION: This study is within normal limits. No seizures or epileptiform d

## 2021-06-25 NOTE — Evaluation (Signed)
Occupational Therapy Evaluation ?Patient Details ?Name: [name]. ?MRN: [id number] ?DOB: [birth year] ?Today's Date: 06/25/2021 ? ? ?History of Present Illness Pt is admitted for hyperkalemia. Pt with complaints of myoclonic twitches, new falls, and possible seizure on 06/23/21. History includes CVA, DM, HLD, ESRD on HD, HTN, heart attack.  ? ?Clinical Impression ?  ?Pt was seen for OT evaluation this date. Prior to hospital admission, pt reports generally independent with ADL and spouse assists with groceries. Per chart, spouse previously reporting pt required some assist for most ADL. No family present to verify at time of OT evaluation. Pt currently demonstrates significant impairments in balance, strength, and activity tolerance as described below (See OT problem list) which functionally limit his ability to perform ADL/self-care tasks and mobility. Pt currently requires MAX A for LB ADL, MOD-MAX A for ADL transfers with RW with heavy posterior lean leading to sitting prematurely on 1st attempt, MIN-MOD A to maintain static standing balance, and MIN A for seated UB ADL Tasks. MOD-MAX A for bed mobility requiring cues for bed rail use to assist in technique. Pt demonstrates decreased insight into deficits and safety. Pt would benefit from skilled OT services to address noted impairments and functional limitations (see below for any additional details) in order to maximize safety and independence while minimizing falls risk and caregiver burden. Upon hospital discharge, recommend STR to maximize pt safety and return to PLOF.   ? ?Recommendations for follow up therapy are one component of a multi-disciplinary discharge planning process, led by the attending physician.  Recommendations may be updated based on patient status, additional functional criteria and insurance authorization.  ? ?Follow Up Recommendations ? Skilled nursing-short term rehab (<3 hours/day)  ?  ?Assistance Recommended at Discharge Frequent or  constant Supervision/Assistance  ?Patient can return home with the following A lot of help with walking and/or transfers;A lot of help with bathing/dressing/bathroom;Assistance with cooking/housework;Assist for transportation;Help with stairs or ramp for entrance;Direct supervision/assist for medications management ? ?  ?Functional Status Assessment ? Patient has had a recent decline in their functional status and demonstrates the ability to make significant improvements in function in a reasonable and predictable amount of time.  ?Equipment Recommendations ? BSC/3in1  ?  ?Recommendations for Other Services   ? ? ?  ?Precautions / Restrictions Precautions ?Precautions: Fall ?Restrictions ?Weight Bearing Restrictions: No  ? ?  ? ?Mobility Bed Mobility ?Overal bed mobility: Needs Assistance ?Bed Mobility: Supine to Sit, Sit to Supine ?  ?  ?Supine to sit: Mod assist, Max assist ?Sit to supine: Mod assist ?  ?General bed mobility comments: increased time/effort to perform, ultimately requiring signifcant assist to sit ?  ? ?Transfers ?Overall transfer level: Needs assistance ?Equipment used: Rolling walker (2 wheels) ?Transfers: Sit to/from Stand ?Sit to Stand: Max assist, From elevated surface ?  ?  ?  ?  ?  ?General transfer comment: first attempt unsuccessful, cues for repositioning and hand/foot placement to improve technique, MAX A on 2nd attempt with bed elevated with heavy psoterior lean ?  ? ?  ?Balance Overall balance assessment: History of Falls, Needs assistance ?Sitting-balance support: Feet supported, Bilateral upper extremity supported ?Sitting balance-Leahy Scale: Fair ?  ?Postural control: Posterior lean ?Standing balance support: Bilateral upper extremity supported ?Standing balance-Leahy Scale: Poor ?Standing balance comment: requires MIN-MOD A to maintain static standing after initially sitting without warning due to heavy posterior lean ?  ?  ?  ?  ?  ?  ?  ?  ?  ?  ?  ?   ? ?  ADL either performed or  assessed with clinical judgement  ? ?ADL Overall ADL's : Needs assistance/impaired ?  ?  ?  ?  ?  ?  ?  ?  ?  ?  ?  ?  ?  ?  ?  ?  ?  ?  ?  ?General ADL Comments: Pt required MOD-MAX A for LB ADL, MOD-MAX A for ADL transfers, and MIN A for seated UB ADL Tasks.  ? ? ? ?Vision   ?   ?   ?Perception   ?  ?Praxis   ?  ? ?Pertinent Vitals/Pain Pain Assessment ?Pain Assessment: No/denies pain  ? ? ? ?Hand Dominance   ?  ?Extremity/Trunk Assessment Upper Extremity Assessment ?Upper Extremity Assessment: Generalized weakness;LUE deficits/detail ?LUE Deficits / Details: grossly 4/5 ?  ?Lower Extremity Assessment ?Lower Extremity Assessment: LLE deficits/detail;Generalized weakness ?LLE Deficits / Details: grossly 4/5 ?  ?  ?  ?Communication Communication ?Communication:  (very soft spoken) ?  ?Cognition Arousal/Alertness: Awake/alert ?Behavior During Therapy: [facility] for tasks assessed/performed ?Overall Cognitive Status: No family/caregiver present to determine baseline cognitive functioning ?  ?  ?  ?  ?  ?  ?  ?  ?  ?  ?  ?  ?  ?  ?  ?  ?General Comments: decreased insight into deficits and safety ?  ?  ?General Comments    ? ?  ?Exercises   ?  ?Shoulder Instructions    ? ? ?Home Living Family/patient expects to be discharged to:: Private residence ?Living Arrangements: Spouse/significant other ?Available Help at Discharge: Family;Available PRN/intermittently ?Type of Home: House ?Home Access: Ramped entrance ?  ?  ?Home Layout: One level ?  ?  ?Bathroom Shower/Tub: Walk-in shower ?  ?Bathroom Toilet: Handicapped height ?Bathroom Accessibility: Yes ?  ?Home Equipment: Conservation officer, nature (2 wheels);Cane - single point ?  ?Additional Comments: in process of getting rollator ?  ? ?  ?Prior Functioning/Environment Prior Level of Function : Needs assist ?  ?  ?  ?  ?  ?  ?Mobility Comments: Pt reports he was previously using SPC in community, however with recent fall was using RW at all times. Multiple recent falls. ?ADLs Comments:  wife reports she has been assisting with all ADLs ?  ? ?  ?  ?OT Problem List: Decreased strength;Impaired balance (sitting and/or standing);Decreased knowledge of use of DME or AE;Decreased activity tolerance ?  ?   ?OT Treatment/Interventions: Self-care/ADL training;Therapeutic exercise;Therapeutic activities;DME and/or AE instruction;Patient/family education;Balance training  ?  ?OT Goals(Current goals can be found in the care plan section) Acute Rehab OT Goals ?Patient Stated Goal: go home ?OT Goal Formulation: With patient ?Time For Goal Achievement: 07/09/21 ?Potential to Achieve Goals: Fair ?ADL Goals ?Pt Will Perform Lower Body Dressing: with min assist;sit to/from stand ?Pt Will Transfer to Toilet: with min assist;bedside commode;ambulating (LRAD) ?Pt Will Perform Toileting - Clothing Manipulation and hygiene: sitting/lateral leans;sit to/from stand;with set-up;with min guard assist ?Additional ADL Goal #1: Pt will complete bed mobility with supervision for safety.  ?OT Frequency: Min 2X/week ?  ? ?Co-evaluation   ?  ?  ?  ?  ? ?  ?AM-PAC OT "6 Clicks" Daily Activity     ?Outcome Measure Help from another person eating meals?: None ?Help from another person taking care of personal grooming?: A Little ?Help from another person toileting, which includes using toliet, bedpan, or urinal?: Total ?Help from another person bathing (including washing, rinsing, drying)?: A Lot ?Help from another  person to put on and taking off regular upper body clothing?: A Little ?Help from another person to put on and taking off regular lower body clothing?: A Lot ?6 Click Score: 15 ?  ?End of Session Equipment Utilized During Treatment: Rolling walker (2 wheels);Gait belt ?Nurse Communication: Mobility status ? ?Activity Tolerance: Patient tolerated treatment well ?Patient left: in bed;with call bell/phone within reach;with bed alarm set ? ?OT Visit Diagnosis: Other abnormalities of gait and mobility (R26.89);Muscle weakness  (generalized) (M62.81)  ?              ?Time: 6222-9798 ?OT Time Calculation (min): 21 min ?Charges:  OT General Charges ?$OT Visit: 1 Visit ?OT Evaluation ?$OT Eval Moderate Complexity: 1 Mod ? ?Roselyn Reef R

## 2021-06-25 NOTE — Progress Notes (Signed)
PT Cancellation Note ? ?Patient Details ?Name: [name]. ?MRN: [id number] ?DOB: Sep 04, 1970 ? ? ?Cancelled Treatment:    Reason Eval/Treat Not Completed: Other (comment). Pt currently at dialysis, unavailable for PT treatment Also with K+ of 5.9 pre-HD. Will re-attempt treatment at later date/time as pt is available and medically appropriate ? ? ?Zinnia Tindall ?06/25/2021, 10:17 AM ?Greggory Stallion, PT, DPT, GCS ?[phone] ? ?

## 2021-06-25 NOTE — TOC Initial Note (Signed)
Transition of Care (TOC) - Initial/Assessment Note  ? ? ?Patient Details  ?Name: [name]. ?MRN: [id number] ?Date of Birth: 09-Oct-1970 ? ?Transition of Care (TOC) CM/SW Contact:    ?Alberteen Sam, LCSW ?Phone Number: ?06/25/2021, 3:42 PM ? ?Clinical Narrative:                 ? ?CSW attempted to speak with patient however he asked could this CSW return later.  ? ?CSW has called patient's spouse reports they are in agreement with [facility] for PT and OT which they have had in the past, reports interested in hospital bed and wheel chair as well.  ? ?Referral for [facility] sent to [facility] with Alvis Lemmings for PT and OT at dc.  ? ?Expected Discharge Plan: [place] ?Barriers to Discharge: Continued Medical Work up ? ? ?Patient Goals and CMS Choice ?Patient states their goals for this hospitalization and ongoing recovery are:: to go home ?CMS Medicare.gov Compare Post Acute Care list provided to:: Patient ?Choice offered to / list presented to : Patient ? ?Expected Discharge Plan and Services ?Expected Discharge Plan: [place] ?  ?  ?  ?Living arrangements for the past 2 months: [place] ?                ?DME Arranged: Hospital bed, Wheelchair manual ?DME Agency: [employer] ?Date DME Agency Contacted: 06/25/21 ?Time DME Agency Contacted: [phone] ?Representative spoke with at DME Agency: Thedore Mins ?HH Arranged: PT, OT ?[place] Agency: [place] ?Date HH Agency Contacted: 06/25/21 ?Time [place]: [phone] ?Representative spoke with at [place]: Tommi Rumps ? ?Prior Living Arrangements/Services ?Living arrangements for the past 2 months: [place] ?Lives with:: Spouse ?  ?Do you feel safe going back to the place where you live?: Yes      ?  ?  ?  ?  ? ?Activities of Daily Living ?  ?  ? ?Permission Sought/Granted ?  ?  ?   ?   ?   ?   ? ?Emotional Assessment ?  ?  ?  ?Orientation: : Oriented to Self, Oriented to Place, Oriented to  Time, Oriented to Situation ?Alcohol /  Substance Use: Not Applicable ?Psych Involvement: No (comment) ? ?Admission diagnosis:  Hyperkalemia [E87.5] ?Muscle spasm [M62.838] ?Patient Active Problem List  ? Diagnosis Date Noted  ? 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 C. difficile colitis 05/05/2021  ? Cerebral ischemic stroke due to global hypoperfusion with watershed infarct [facility]) 04/24/2021  ? Peritonitis ([place]) 04/22/2021  ? Hypertensive crisis 04/21/2021  ? Acute Bil  CVAs of Bil Frontal amd Rt Parietal  in [employer] 04/21/2021  ? CVA (cerebral vascular accident) ([place]) 02/13/2021  ? Steal syndrome as complication of dialysis access [facility]) 02/06/2021  ? Folate deficiency 02/06/2021  ? Hyperkalemia 02/05/2021  ? Hyperphosphatemia 02/05/2021  ? Hypermagnesemia 02/05/2021  ? Coronary artery disease 02/05/2021  ? Hypernatremia 02/05/2021  ? Osteomyelitis ([place]) 02/05/2021  ? Wound infection 02/04/2021  ? Infected wound 01/29/2021  ? Wound dehiscence, surgical, initial encounter 01/29/2021  ? ESRD on hemodialysis ([place])   ? Prolonged Q-T interval on ECG   ? Anemia due to chronic kidney disease, on chronic dialysis ([place])   ? Atrial fibrillation with RVR ([place]) 12/08/2020  ? Finger amputation, 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 70/35/0093  ? Atrial fibrillation with rapid ventricular response ([place])   ? Gangrene of finger of right hand ([place]) 11/25/2020  ? Postural dizziness with presyncope 11/24/2020  ?  Elevated troponin 11/24/2020  ? Left leg cellulitis 11/24/2020  ? Frequent falls 11/24/2020  ? Osteomyelitis of finger of right hand ([place]) 11/24/2020  ? Paroxysmal atrial fibrillation ([place]) 11/24/2020  ? Hypotension 11/24/2020  ? NSTEMI (non-ST elevated myocardial infarction) ([place]) 11/24/2020  ? Abnormal gait 08/19/2020  ? 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[place]) 08/19/2020  ? Anxiety state 08/19/2020  ? Callosity 08/19/2020  ? ED (erectile dysfunction) of organic origin 08/19/2020  ? Heart failure ([place]) 08/19/2020  ? Long term (current) use of insulin ([place]) 08/19/2020  ? Myalgia  08/19/2020  ? Obstructive uropathy 08/19/2020  ? Reactive depression 08/19/2020  ? CAD/ H/o prior STEMI 02/2020 s/p DES to LAD 08/19/2020  ? ESRD (end stage renal disease) ([place]) 08/19/2020  ? Blindness of left eye 08/15/2020  ? Combined forms of age-related cataract of right eye 04/17/2020  ? Supraventricular tachycardia ([place]) 02/26/2020  ? Acute anterior wall MI ([place])   ? Benign prostatic hyperplasia with lower urinary tract symptoms 11/07/2019  ? Fluid overload, unspecified 08/15/2019  ? Hypertension secondary to other renal disorders 07/20/2019  ? 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 Anemia 06/05/2019  ? Aftercare including intermittent dialysis ([place]) 05/15/2019  ? Moderate protein-calorie malnutrition ([place]) 05/14/2019  ? Anaphylactic reaction due to adverse effect of correct drug or medicament properly administered, initial encounter 05/08/2019  ? Chronic anemia 05/07/2019  ? Chest pain, unspecified 05/07/2019  ? Coagulation defect, unspecified ([place]) 05/07/2019  ? Disorder of phosphorus metabolism, unspecified 05/07/2019  ? Encounter for fitting and adjustment of extracorporeal dialysis catheter ([place]) 05/07/2019  ? Gastroparesis 05/07/2019  ? Iron deficiency anemia, unspecified 05/07/2019  ? Major depressive disorder, recurrent, unspecified ([place]) 05/07/2019  ? Pain, unspecified 05/07/2019  ? Pruritus, unspecified 05/07/2019  ? Dizziness 04/06/2019  ? Metabolic acidosis 27/74/1287  ? Syncope 04/06/2019  ? Urinary retention 04/06/2019  ? Hypertensive chronic kidney disease with stage 1 through stage 4 chronic kidney disease, or unspecified chronic kidney disease 11/28/2018  ? Edema 11/28/2018  ? Hypokalemia 11/28/2018  ? Hypo-osmolality and hyponatremia 11/28/2018  ? Proteinuria 11/28/2018  ? Secondary hyperparathyroidism of renal origin ([place]) 11/28/2018  ? Lymphedema 01/09/2018  ? Chronic Grade 2 diastolic heart failure/EF 55 to 60% 12/13/2017  ? Vomiting 11/28/2017  ? Acute on  chronic heart failure ([place]) 11/23/2017  ? Diabetes mellitus, type II ([place]) 11/23/2017  ? Diabetic retinopathy ([place]) 07/05/2017  ? Uncontrolled type 2 diabetes mellitus with hyperglycemia ([place]) 07/08/2016  ? Uncontrolled type 2 diabetes mellitus with hyperglycemia, with long-term current use of insulin ([place]) 04/20/2016  ? Osteomyelitis of toe of left foot ([place]) 03/15/2016  ? Acute renal failure with tubular necrosis ([place] ) 06/23/2015  ? Diarrhea 06/23/2015  ? Mixed hyperlipidemia   ? Essential hypertension, benign   ? Heart palpitations 05/06/2015  ? Sepsis ([place]) 03/07/2015  ? ?PCP:  [name], MD ?Pharmacy:   ?[place] [place] [address] ?[place] [address] ?Phone: [phone] Fax: [phone] ? ?[facility] ?[address] ?[place] [address] ?Phone: [phone] Fax: [phone] ? ? ? ? ?Social Determinants of Health (SDOH) Interventions ?  ? ?Readmission Risk Interventions ? ?  11/27/2020  ?  3:06 PM 02/28/2020  ?  1:31 PM  ?Readmission Risk Prevention Plan  ?Transportation Screening Complete Complete  ?PCP or Specialist Appt within 3-5 Days Complete Complete  ?[place] or Home Care Consult Complete Complete  ?Social Work Scientific laboratory technician for [employer]  Planning/Counseling Complete Complete  ?Palliative Care Screening Not Applicable Not Applicable  ?Medication Review Press photographer) Complete Complete  ? ? ? ?

## 2021-06-25 NOTE — Progress Notes (Signed)
?PROGRESS NOTE ? ? ? ?[name].  YTK:[id number] DOB: [birth year] DOA: 06/23/2021 ?PCP: [name] Schwalbe, MD  ? ?Assessment & Plan: ?  ?Principal Problem: ?  Hyperkalemia ?Active Problems: ?  ESRD (end stage renal disease) ([place]) ?  Frequent falls ?  Myoclonus ?  Essential hypertension, benign ?  Paroxysmal atrial fibrillation (HCC) ?  CVA (cerebral vascular accident) [facility] [place]) ? ?Assessment and Plan:Hyperkalemia ? ?Hyperkalemia: secondary to ERSD. Needs HD. Management as per nephro  ?  ?ESRD: on HD TTS. Nephro following and recs apprec  ?  ?Possible aspiration pneumonia: continue on IV unasyn, encourage incentive spirometry  ? ?Frequent falls: has fallen several times in the last week. PT/OT recs HH  ?  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 EEG is WNL and no seizures or epileptiform discharges seen throughout recording. Started and will continue on klonopin as per neuro. Will need outpatient neuro f/u  ? ?ACD: likely secondary to ESRD. H&H are stable   ?  ?HTN: continue on amlodipine, clonidine, coreg  ?  ?Hx of CVA: continue on plavix, statin ?  ?PAF: continue on amio, coreg & eliquis  ? ? ? ? ? ? ?DVT prophylaxis: eliquis  ?Code Status: full  ?Family Communication: discussed pt's care w/ pt's wife, [name], and answered her questions  ?Disposition Plan: likely d/c home w/ HH  ? ?Level of care: Progressive ? ?Status is: Observation ?The patient remains OBS appropriate and will d/c before 2 midnights. ? ? ?Consultants:  ?Nephro ?Neuro  ? ? ?Procedures:  ? ?Antimicrobials: unasyn  ? ? ?Subjective: ?[name] fatigue  ? ?Objective: ?Vitals:  ? 06/24/21 2243 06/24/21 2326 06/25/21 0346 06/25/21 0725  ?BP: (!) 165/87 (!) 178/89 (!) 195/93 (!) 194/93  ?Pulse: 63 63 67 72  ?Resp:  14 16 17   ?Temp:  97.9 ?F (36.6 ?C) 98.4 ?F (36.9 ?C) 98.2 ?F (36.8 ?C)  ?TempSrc:  Oral Oral   ?SpO2:  100% 98% 99%  ?Weight:      ?Height:      ? ? ?Intake/Output Summary (Last 24 hours) at 06/25/2021 0733 ?Last data  filed at 06/25/2021 0100 ?Gross per 24 hour  ?Intake 220 ml  ?Output --  ?Net 220 ml  ? ?Filed Weights  ? 06/23/21 1114 06/23/21 2235 06/24/21 2112  ?Weight: 79.4 kg 87.9 kg 100.3 kg  ? ? ?Examination: ? ?General exam: Appears comfortable  ?Respiratory system: decreased breath sounds b/l  ?Cardiovascular system: irregularly irregular. No rubs or clicks  ?Gastrointestinal system: Abd is soft, NT, ND & hypoactive bowel sounds  ?Central nervous system: Alert and oriented. Moves all extremities   ?Psychiatry: Judgement and insight appears at baseline. Flat mood and affect  ? ? ? ?Data Reviewed: I have personally reviewed following labs and imaging studies ? ?CBC: ?Recent Labs  ?Lab 06/23/21 ?1124 06/24/21 ?0321 06/25/21 ?0411  ?WBC 7.0 6.4 8.1  ?HGB 11.8* 11.8* 11.6*  ?HCT 39.8 38.6* 38.7*  ?MCV 75.0* 72.4* 73.3*  ?PLT 165 162 150  ? ?Basic Metabolic Panel: ?Recent Labs  ?Lab 06/23/21 ?1124 06/23/21 ?1558 06/23/21 ?1705 06/23/21 ?1805 06/23/21 ?2238 06/24/21 ?0321 06/25/21 ?0411  ?NA 139  --   --   --  137 136 137  ?K 6.7*   < > 5.0 4.3 4.6 5.2* 5.9*  ?CL 108  --   --   --  101 103 102  ?CO2 18*  --   --   --  21* 24 23  ?GLUCOSE 77  --   --   --  87 89 73  ?BUN 84*  --   --   --  46* 51* 65*  ?CREATININE 11.76*  --   --   --  7.75* 8.40* 10.33*  ?CALCIUM 8.8*  --   --   --  9.0 8.6* 8.3*  ?MG  --   --   --   --  1.9  --   --   ?PHOS  --   --   --   --   --  6.7*  --   ? < > = values in this interval not displayed.  ? ?GFR: ?Estimated Creatinine Clearance: 9.9 mL/min (A) (by C-G formula based on SCr of 10.33 mg/dL (H)). ?Liver Function Tests: ?Recent Labs  ?Lab 06/23/21 ?2238  ?AST 15  ?ALT 9  ?ALKPHOS 84  ?BILITOT 1.1  ?PROT 7.0  ?ALBUMIN 3.5  ? ?No results for input(s): LIPASE, AMYLASE in the last 168 hours. ?Recent Labs  ?Lab 06/23/21 ?2238  ?AMMONIA 24  ? ?Coagulation Profile: ?No results for input(s): INR, PROTIME in the last 168 hours. ?Cardiac Enzymes: ?No results for input(s): CKTOTAL, CKMB, CKMBINDEX, TROPONINI  in the last 168 hours. ?BNP (last 3 results) ?No results for input(s): PROBNP in the last 8760 hours. ?HbA1C: ?No results for input(s): HGBA1C in the last 72 hours. ?CBG: ?Recent Labs  ?Lab 06/23/21 ?1127 06/23/21 ?2204 06/23/21 ?2232  ?GLUCAP 71 74 78  ? ?Lipid Profile: ?No results for input(s): CHOL, HDL, LDLCALC, TRIG, CHOLHDL, LDLDIRECT in the last 72 hours. ?Thyroid Function Tests: ?No results for input(s): TSH, T4TOTAL, FREET4, T3FREE, THYROIDAB in the last 72 hours. ?Anemia Panel: ?No results for input(s): VITAMINB12, FOLATE, FERRITIN, TIBC, IRON, RETICCTPCT in the last 72 hours. ?Sepsis Labs: ?No results for input(s): PROCALCITON, LATICACIDVEN in the last 168 hours. ? ?Recent Results (from the past 240 hour(s))  ?MRSA Next Gen by PCR, Nasal     Status: None  ? Collection Time: 06/23/21 10:30 PM  ? Specimen: Nasal Mucosa; Nasal Swab  ?Result Value Ref Range Status  ? MRSA by PCR Next Gen NOT DETECTED NOT DETECTED Final  ?  Comment: (NOTE) ?The GeneXpert MRSA Assay (FDA approved for NASAL specimens only), ?is one component of a comprehensive MRSA colonization surveillance ?program. It is not intended to diagnose MRSA infection nor to guide ?or monitor treatment for MRSA infections. ?Test performance is not FDA approved in patients less than 2 years ?old. ?Performed at [facility], [place], ?[place] [address] ?  ?  ? ? ? ? ? ?Radiology Studies: ?CT HEAD WO CONTRAST (5MM) ? ?Result Date: 06/23/2021 ?CLINICAL DATA:  Weakness. Fell yesterday. Previous stroke with right-sided weakness. EXAM: CT HEAD WITHOUT CONTRAST TECHNIQUE: Contiguous axial images were obtained from the base of the skull through the vertex without intravenous contrast. RADIATION DOSE REDUCTION: This exam was performed according to the departmental dose-optimization program which includes automated exposure control, adjustment of the mA and/or kV according to patient size and/or use of iterative reconstruction technique.  COMPARISON:  06/16/2021 FINDINGS: Brain: Significantly age advanced cerebral atrophy. Mild low density in the periventricular white matter likely related to small vessel disease. No mass lesion, hemorrhage, hydrocephalus, acute infarct, intra-axial, or extra-axial fluid collection. Vascular: No hyperdense vessel or unexpected calcification. Intracranial atherosclerosis. Skull: Persistent soft tissue thickening in the right paramidline posterior occipital region including on 01/03. Present on 06/16/2021. No skull fracture. Sinuses/Orbits: Small, hypoattenuating left globe may be prostatic. Clear paranasal sinuses and mastoid air cells. Other: None. IMPRESSION:  1. Significantly age advanced cerebral atrophy. Small vessel ischemic change in the periventricular white matter. No acute superimposed process. 2. Persistent soft tissue thickening about the posterior paramidline occiput, possibly chronic hematoma. Electronically Signed   By: Abigail Miyamoto M.D.   On: 06/23/2021 12:20  ? ?MR BRAIN WO CONTRAST ? ?Result Date: 06/23/2021 ?CLINICAL DATA:  Stroke suspected (Ped 0-17y) EXAM: MRI HEAD WITHOUT CONTRAST TECHNIQUE: Multiplanar, multiecho pulse sequences of the brain and surrounding structures were obtained without intravenous contrast. COMPARISON:  04/21/2021 FINDINGS: Brain: There are small chronic infarcts of the high frontal lobes bilaterally and right parietal lobe reflecting evolution of infarcts seen on the prior study. No acute infarction. No evidence of hemorrhage. There is no intracranial mass, mass effect, or edema. There is no hydrocephalus or extra-axial fluid collection. Additional patchy T2 hyperintensity in the supratentorial and pontine white matter is nonspecific but may reflect similar chronic microvascular ischemic changes. Small chronic right thalamic infarct. Prominence of the ventricles and sulci reflects similar minor parenchymal volume loss. Vascular: Major vessel flow voids at the skull base are  preserved. Skull and upper cervical spine: Normal marrow signal is preserved. Sinuses/Orbits: Minor mucosal thickening. Right lens replacement. Left ocular prosthesis. Other: Sella is unremarkable.  Mastoid

## 2021-06-25 NOTE — Progress Notes (Signed)
OT Cancellation Note ? ?Patient Details ?Name: [name]. ?MRN: [id number] ?DOB: [birth year] ? ? ?Cancelled Treatment:    Reason Eval/Treat Not Completed: Patient at procedure or test/ unavailable. Consult received, chart reviewed. Pt currently at dialysis, unavailable for OT evaluation. Also with K+ of 5.9 pre-HD. Will re-attempt OT evaluation at later date/time as pt is available and medically appropriate.  ? ?Ardeth Perfect., MPH, MS, OTR/L ?ascom [phone] ?06/25/21, 9:45 AM ? ?

## 2021-06-25 NOTE — Progress Notes (Signed)
Patient transferred from ICU to room 231 via bed. A&O x4. RA. No c/o pain overnight. Continues to have myoclonic jerking, pt reports feeling as though it is increased when he speaks. Provider made aware of increasing BP overnight w/ the highest at 195/93. No new orders at this time. Order parameters not yet met to medicate. Patient oriented to room and use of call bell with verbalized understanding. Making needs known.  ?

## 2021-06-25 NOTE — Progress Notes (Signed)
Neurology Progress Note ? ? ?S:// ?Seen and examined while in dialysis ?EEG completed yesterday-no seizures.  Subtle head jerks captured during EEG without concomitant EEG change-not epileptic but could be myoclonic. ?Started Klonopin yesterday ? ?O:// ?Current vital signs: ?BP (!) 189/96   Pulse 73   Temp 98.9 ?F (37.2 ?C) (Oral)   Resp 13   Ht 6\' 3"  (1.905 m)   Wt 101.2 kg   SpO2 99%   BMI 27.89 kg/m?  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 ?CVS regular rate rhythm ?Respiratory: Breathing well saturating normally on room air ?Abdomen nondistended nontender ?Lower extremity with 2 amputations. ?Neurologic exam ?Awake alert oriented x3 ?No dysarthria ?No aphasia ?Did not notice any myoclonic jerking in about 10 to 15 minutes at the bedside. ?Cranial nerves II to XII: Right pupil equal round reactive to light, left eye enucleated, no gaze preference or deviation, visual field full on the right eye, face appears symmetric, tongue and palate midline. ?Motor exam-antigravity strength in all fours with resisted range of motion in both shoulders due to rotator cuff injuries/tears ?Sensory exam: Glove and stocking type distribution of sensory loss which is symmetric without extinction ?No gross dysmetria but generalized body myoclonus on coordination exam. ?Unchanged exam for me. ? ?Medications ? ?Current Facility-Administered Medications:  ?  0.9 %  sodium chloride infusion, 250 mL, Intravenous, PRN, Agbata, Tochukwu, MD ?  acetaminophen (TYLENOL) tablet 650 mg, 650 mg, Oral, Q6H PRN, 650 mg at 06/24/21 0057 **OR** acetaminophen (TYLENOL) suppository  650 mg, 650 mg, Rectal, Q6H PRN, Agbata, Tochukwu, MD ?  amiodarone (PACERONE) tablet 200 mg, 200 mg, Oral, Daily, Agbata, Tochukwu, MD, 200 mg at 06/25/21 0819 ?  amLODipine (NORVASC) tablet 5 mg, 5 mg, Oral, Daily, Agbata, Tochukwu, MD, 5 mg at 06/25/21 0820 ?  Ampicillin-Sulbactam (UNASYN) 3 g in sodium chloride 0.9 % 100 mL IVPB, 3 g, Intravenous, Q12H, Belue, Alver Sorrow, RPH, Last Rate: 200 mL/hr at 06/24/21 2320, 3 g at 06/24/21 2320 ?  apixaban (ELIQUIS) tablet 2.5 mg, 2.5 mg, Oral, BID, Agbata, Tochukwu, MD, 2.5 mg at 06/25/21 0820 ?  atorvastatin (LIPITOR) tablet 80 mg, 80 mg, Oral, Daily, Agbata, Tochukwu, MD, 80 mg at 06/25/21 0819 ?  calcium acetate (PHOSLO) capsule 1,334 mg, 1,334 mg, Oral, TID WC, Agbata, Tochukwu, MD, 1,334 mg at 06/25/21 0818 ?  carvedilol (COREG) tablet 25 mg, 25 mg, Oral, BID, Agbata, Tochukwu, MD, 25 mg at 06/25/21 0820 ?  Chlorhexidine Gluconate Cloth 2 % PADS 6 each, 6 each, Topical, Q0600, Colon Flattery, NP, 6 each at 06/25/21 0509 ?  clonazePAM (KLONOPIN) disintegrating tablet 0.5 mg, 0.5 mg, Oral, BID, Amie Portland, MD, 0.5 mg at 06/25/21 3500 ?  cloNIDine (CATAPRES) tablet 0.1 mg, 0.1 mg, Oral, TID PRN, Agbata, Tochukwu, MD, 0.1 mg at 06/24/21 0101 ?  clopidogrel (PLAVIX) tablet 75 mg, 75 mg, Oral, Daily, Agbata, Tochukwu, MD, 75 mg at 06/25/21 0819 ?  cyclobenzaprine (FLEXERIL) tablet 10 mg, 10 mg, Oral, TID, Agbata, Tochukwu, MD, 10 mg at 06/25/21 0820 ?  dorzolamide-timolol (COSOPT) 22.3-6.8 MG/ML ophthalmic solution 1 drop, 1 drop, Right Eye, BID, Agbata, Tochukwu, MD,  1 drop at 06/25/21 0814 ?  irbesartan (AVAPRO) tablet 150 mg, 150 mg, Oral, Daily, Breeze, Shantelle, NP ?  melatonin tablet 5 mg, 5 mg, Oral, QHS PRN, Agbata, Tochukwu, MD ?  nitroGLYCERIN (NITROSTAT) SL tablet 0.4 mg, 0.4 mg, Sublingual, Q5 min PRN, Agbata, Tochukwu, MD ?  phenylephrine ((USE for PREPARATION-H)) 0.25 % suppository 1 suppository, 1 suppository, Rectal, Daily, Agbata, Tochukwu, MD ?   prednisoLONE acetate (PRED FORTE) 1 % ophthalmic suspension 1 drop, 1 drop, Left Eye, Q4H, Agbata, Tochukwu, MD, 1 drop at 06/25/21 0815 ?  sodium chloride flush (NS) 0.9 % injection 3 mL, 3 mL, Intravenous, Q12H, Agbata, Tochukwu, MD, 3 mL at 06/24/21 2240 ?  sodium chloride flush (NS) 0.9 % injection 3 mL, 3 mL, Intravenous, PRN, Agbata, Tochukwu, MD, 3 mL at 06/25/21 0821 ?  tamsulosin (FLOMAX) capsule 0.4 mg, 0.4 mg, Oral, Daily, Agbata, Tochukwu, MD, 0.4 mg at 06/25/21 0819 ?Labs ?CBC ?   ?Component Value Date/Time  ? WBC 8.1 06/25/2021 0411  ? RBC 5.28 06/25/2021 0411  ? HGB 11.6 (L) 06/25/2021 0411  ? HCT 38.7 (L) 06/25/2021 0411  ? PLT 150 06/25/2021 0411  ? MCV 73.3 (L) 06/25/2021 0411  ? MCH 22.0 (L) 06/25/2021 0411  ? MCHC 30.0 06/25/2021 0411  ? RDW 20.5 (H) 06/25/2021 0411  ? LYMPHSABS 2.2 04/30/2021 0827  ? MONOABS 0.8 04/30/2021 0827  ? EOSABS 0.4 04/30/2021 0827  ? BASOSABS 0.1 04/30/2021 0827  ? ? ?CMP  ?   ?Component Value Date/Time  ? NA 137 06/25/2021 0411  ? K 5.9 (H) 06/25/2021 0411  ? CL 102 06/25/2021 0411  ? CO2 23 06/25/2021 0411  ? GLUCOSE 73 06/25/2021 0411  ? BUN 65 (H) 06/25/2021 0411  ? CREATININE 10.33 (H) 06/25/2021 0411  ? CALCIUM 8.3 (L) 06/25/2021 0411  ? PROT 7.0 06/23/2021 2238  ? ALBUMIN 3.5 06/23/2021 2238  ? AST 15 06/23/2021 2238  ? ALT 9 06/23/2021 2238  ? ALKPHOS 84 06/23/2021 2238  ? BILITOT 1.1 06/23/2021 2238  ? GFRNONAA 5 (L) 06/25/2021 0411  ? GFRAA 5 (L) 08/27/2019 1540  ? ?Imaging ?I have reviewed images in epic and the results pertinent to this consultation are: ?MRI brain negative for acute infarct.  Evolution of strokes seen in the previous MRI. ? ?Neurodiagnostics ?EEG: IMPRESSION: ?This study is within normal limits. No seizures or epileptiform discharges were seen throughout the recording. ?  ?Patient was noted to have subtle head jerks during the EEG without concomitant EEG change.  These episodes are not epileptic.  Myoclonus could have similar  appearance. ? ?Assessment:  ?51 year old with extensive cerebrovascular risk factor history along with history of ESRD on dialysis, presenting with complaints of jerky movements of his body that appear clinically as  polymyoclonus ?Due to the history of strokes, MRI was repeated which was negative for stroke. He has no history of anoxic brain injury for me to think that this is Berneda Rose syndrome.  He is not on medication such as gabapentin which can also cause the symptoms. ?Etiology of his polymyoclonus is likely metabolic derangements and uremia from ESRD. ?EEG negative for seizure or epileptiform activity. ?Started on Klonopin 0.5 mg twice daily yesterday-appears to have some improvement as I did not notice any myoclonic jerking during my evaluation while in dialysis today ?  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  ?Recommendations: ?-Continue correction of metabolic derangements per primary team and nephrology as you are. ?-Continue Klonopin 0.5 mg twice daily. ?-  Doses of Klonopin could be increased if he has more or breakthrough twitching to the maximum tolerated dose. ?-In case, alternatives to Klonopin are needed due to sedation side effects, Keppra and Depakote are other alternatives. Keppra can only be used renally dosed given his ESRD and Depakote will need monitoring of levels as well as can cause hyperammonemia and deranged liver function--which has to be kept in mind while choosing agents. ?-He will need outpatient neurology follow-up in 4 to 6 weeks. ? ?Discussed with Dr. Jimmye Norman. Called his wife, but unable to reach her. Will try later today to relay my plan to her. ? ?-- ?Amie Portland, MD ?Neurologist ?[facility] ?Pager: [phone] ?

## 2021-06-25 NOTE — Plan of Care (Signed)
?  Problem: Health Behavior/Discharge Planning: ?Goal: Ability to manage health-related needs will improve ?Outcome: Progressing ?  ?Problem: Clinical Measurements: ?Goal: Ability to maintain clinical measurements within normal limits will improve ?Outcome: Progressing ?Goal: Diagnostic test results will improve ?Outcome: Progressing ?Goal: Cardiovascular complication will be avoided ?Outcome: Progressing ?  ?Problem: Safety: ?Goal: Ability to remain free from injury will improve ?Outcome: Progressing ?  ?

## 2021-06-26 DIAGNOSIS — G253 Myoclonus: Secondary | ICD-10-CM | POA: Diagnosis not present

## 2021-06-26 DIAGNOSIS — R296 Repeated falls: Secondary | ICD-10-CM

## 2021-06-26 DIAGNOSIS — N186 End stage renal disease: Secondary | ICD-10-CM | POA: Diagnosis not present

## 2021-06-26 LAB — BASIC METABOLIC PANEL
Anion gap: 12 (ref 5–15)
BUN: 42 mg/dL — ABNORMAL HIGH (ref 6–20)
CO2: 25 mmol/L (ref 22–32)
Calcium: 8.5 mg/dL — ABNORMAL LOW (ref 8.9–10.3)
Chloride: 98 mmol/L (ref 98–111)
Creatinine, Ser: 8.25 mg/dL — ABNORMAL HIGH (ref 0.61–1.24)
GFR, Estimated: 7 mL/min — ABNORMAL LOW (ref >60)
Glucose, Bld: 73 mg/dL (ref 70–99)
Potassium: 4.5 mmol/L (ref 3.5–5.1)
Sodium: 135 mmol/L (ref 135–145)

## 2021-06-26 LAB — CBC
HCT: 37.2 % — ABNORMAL LOW (ref 39.0–52.0)
Hemoglobin: 11.1 g/dL — ABNORMAL LOW (ref 13.0–17.0)
MCH: 21.9 pg — ABNORMAL LOW (ref 26.0–34.0)
MCHC: 29.8 g/dL — ABNORMAL LOW (ref 30.0–36.0)
MCV: 73.5 fL — ABNORMAL LOW (ref 80.0–100.0)
Platelets: 136 10*3/uL — ABNORMAL LOW (ref 150–400)
RBC: 5.06 MIL/uL (ref 4.22–5.81)
RDW: 20.4 % — ABNORMAL HIGH (ref 11.5–15.5)
WBC: 8 10*3/uL (ref 4.0–10.5)
nRBC: 0 % (ref 0.0–0.2)

## 2021-06-26 NOTE — Progress Notes (Signed)
Patient wants to be discharged now. He said he will leave if he is not discharged. Writer talked with patient and explained the implications of leaving against medical advise, but he still wants to leave. Provider on call paged to notify. ?

## 2021-06-26 NOTE — Plan of Care (Signed)

## 2021-06-26 NOTE — Progress Notes (Signed)
?[facility]  ?ROUNDING NOTE  ? ?Subjective:  ? ?[name]. Is a 51 year old African-American male with past medical history including diabetes, hypertension, GERD, CVA, and end-stage renal disease on hemodialysis.  Patient presents to the emergency department via EMS with complaints of a   Patient has been admitted for Hyperkalemia [E87.5] ?Muscle spasm [M62.838] ? ?Patient is known to our practice and receives outpatient dialysis treatments at [facility] on a TTS schedule, supervised by Dr. Candiss Norse.  ? ?Update: ? ?Patient seen resting in bed, no family at bedside ?Alert and oriented ?States his tremors are slightly improved ?States he is ready to go home ? ?Dialysis yesterday tolerated well ? ?Objective:  ?Vital signs in last 24 hours:  ?Temp:  [98.2 ?F (36.8 ?C)-99.2 ?F (37.3 ?C)] 99.2 ?F (37.3 ?C) (05/05 0830) ?Pulse Rate:  [69-76] 72 (05/05 0830) ?Resp:  [10-20] 16 (05/05 0830) ?BP: (164-189)/(88-95) 189/92 (05/05 0830) ?SpO2:  [94 %-96 %] 96 % (05/05 0830) ?Weight:  [100.2 kg] 100.2 kg (05/04 1302) ? ?Weight change: 0.864 kg ?Filed Weights  ? 06/24/21 2112 06/25/21 0934 06/25/21 1302  ?Weight: 100.3 kg 101.2 kg 100.2 kg  ? ? ?Intake/Output: ?I/O last 3 completed shifts: ?In: 951 [P.O.:720; IV Piggyback:200] ?Out: 1002 [Other:1002] ?  ?Intake/Output this shift: ? Total I/O ?In: 240 [P.O.:240] ?Out: -  ? ?Physical Exam: ?General: NAD, resting quietly  ?Head: Normocephalic, atraumatic. Moist oral mucosal membranes  ?Eyes: Anicteric  ?Lungs:  Clear to auscultation, normal effort  ?Heart: Regular rate and rhythm  ?Abdomen:  Soft, nontender  ?Extremities: Trace peripheral edema.  ?Neurologic: Nonfocal, moving all four extremities  ?Skin: No lesions  ?Access: Right PermCath  ? ? ?Basic Metabolic Panel: ?Recent Labs  ?Lab 06/23/21 ?1124 06/23/21 ?1558 06/23/21 ?1805 06/23/21 ?2238 06/24/21 ?0321 06/25/21 ?0411 06/26/21 ?8841  ?NA 139  --   --  137 136 137 135  ?K 6.7*   < > 4.3 4.6 5.2*  5.9* 4.5  ?CL 108  --   --  101 103 102 98  ?CO2 18*  --   --  21* 24 23 25   ?GLUCOSE 77  --   --  87 89 73 73  ?BUN 84*  --   --  46* 51* 65* 42*  ?CREATININE 11.76*  --   --  7.75* 8.40* 10.33* 8.25*  ?CALCIUM 8.8*  --   --  9.0 8.6* 8.3* 8.5*  ?MG  --   --   --  1.9  --   --   --   ?PHOS  --   --   --   --  6.7*  --   --   ? < > = values in this interval not displayed.  ? ? ? ?Liver Function Tests: ?Recent Labs  ?Lab 06/23/21 ?2238  ?AST 15  ?ALT 9  ?ALKPHOS 84  ?BILITOT 1.1  ?PROT 7.0  ?ALBUMIN 3.5  ? ? ?No results for input(s): LIPASE, AMYLASE in the last 168 hours. ?Recent Labs  ?Lab 06/23/21 ?2238  ?AMMONIA 24  ? ? ? ?CBC: ?Recent Labs  ?Lab 06/23/21 ?1124 06/24/21 ?0321 06/25/21 ?0411 06/26/21 ?6606  ?WBC 7.0 6.4 8.1 8.0  ?HGB 11.8* 11.8* 11.6* 11.1*  ?HCT 39.8 38.6* 38.7* 37.2*  ?MCV 75.0* 72.4* 73.3* 73.5*  ?PLT 165 162 150 136*  ? ? ? ?Cardiac Enzymes: ?No results for input(s): CKTOTAL, CKMB, CKMBINDEX, TROPONINI in the last 168 hours. ? ?BNP: ?Invalid input(s): POCBNP ? ?CBG: ?Recent Labs  ?Lab 06/23/21 ?  1127 06/23/21 ?2204 06/23/21 ?2232  ?GLUCAP 71 74 78  ? ? ? ?Microbiology: ?Results for orders placed or performed during the hospital encounter of 06/23/21  ?MRSA Next Gen by PCR, Nasal     Status: None  ? Collection Time: 06/23/21 10:30 PM  ? Specimen: Nasal Mucosa; Nasal Swab  ?Result Value Ref Range Status  ? MRSA by PCR Next Gen NOT DETECTED NOT DETECTED Final  ?  Comment: (NOTE) ?The GeneXpert MRSA Assay (FDA approved for NASAL specimens only), ?is one component of a comprehensive MRSA colonization surveillance ?program. It is not intended to diagnose MRSA infection nor to guide ?or monitor treatment for MRSA infections. ?Test performance is not FDA approved in patients less than 2 years ?old. ?Performed at [facility], [place], ?[place] [address] ?  ? ? ?Coagulation Studies: ?No results for input(s): LABPROT, INR in the last 72 hours. ? ?Urinalysis: ?No results for  input(s): COLORURINE, LABSPEC, [place], GLUCOSEU, HGBUR, BILIRUBINUR, KETONESUR, PROTEINUR, UROBILINOGEN, NITRITE, LEUKOCYTESUR in the last 72 hours. ? ?Invalid input(s): APPERANCEUR  ? ? ?Imaging: ?EEG adult ? ?Result Date: 06/24/2021 ?Lora Havens, MD     06/24/2021  2:07 PM Patient Name: [name]. MRN: [id number] Epilepsy Attending: Lora Havens Referring Physician/Provider: Amie Portland, MD Date: 06/24/2021 Duration: 26.18 mins Patient history: 51 year old with extensive cerebrovascular risk factor history presenting with complaints of jerky movements of his body that appear clinically as  polymyoclonus.  EEG to evaluate for seizure. Level of alertness: Awake, asleep AEDs during EEG study: None Technical aspects: This EEG study was done with scalp electrodes positioned according to the 10-20 International system of electrode placement. Electrical activity was acquired at a sampling rate of 500Hz  and reviewed with a high frequency filter of 70Hz  and a low frequency filter of 1Hz . EEG data were recorded continuously and digitally stored. Description: The posterior dominant rhythm consists of 8Hz  activity of moderate voltage (25-35 uV) seen predominantly in posterior head regions, symmetric and reactive to eye opening and eye closing. Sleep was characterized by vertex waves, sleep spindles (12 to 14 Hz), maximal frontocentral region.  Patient was noted to have subtle head jerks multiple times during the EEG.  Concomitant EEG before, during and after the event did not show any EEG change to suggest seizure.  Hyperventilation and photic stimulation were not performed.   IMPRESSION: This study is within normal limits. No seizures or epileptiform discharges were seen throughout the recording. Patient was noted to have subtle head jerks during the EEG without concomitant EEG change.  These episodes are not epileptic.  Myoclonus could have similar appearance. Priyanka [name]   ? ? ?Medications:  ? ? sodium  chloride    ? ampicillin-sulbactam (UNASYN) IV 3 g (06/25/21 2300)  ? ? amiodarone  200 mg Oral Daily  ? amLODipine  5 mg Oral Daily  ? apixaban  2.5 mg Oral BID  ? atorvastatin  80 mg Oral Daily  ? calcium acetate  1,334 mg Oral TID WC  ? carvedilol  25 mg Oral BID  ? Chlorhexidine Gluconate Cloth  6 each Topical Q0600  ? clonazePAM  0.5 mg Oral BID  ? clopidogrel  75 mg Oral Daily  ? cyclobenzaprine  10 mg Oral TID  ? dorzolamide-timolol  1 drop Right Eye BID  ? irbesartan  150 mg Oral Daily  ? phenylephrine  1 suppository Rectal Daily  ? prednisoLONE acetate  1 drop Left Eye Q4H  ? sodium chloride  flush  3 mL Intravenous Q12H  ? tamsulosin  0.4 mg Oral Daily  ? ?sodium chloride, acetaminophen **OR** acetaminophen, cloNIDine, melatonin, nitroGLYCERIN, sodium chloride flush ? ?Assessment/ Plan:  ?[name]. is a 51 y.o.  male with past medical history including diabetes, hypertension, GERD, CVA, and end-stage renal disease on hemodialysis.  Patient presents to the emergency department via EMS with complaints of a   Patient has been admitted for Hyperkalemia [E87.5] ?Muscle spasm [M62.838] ? ?[facility] TTS right PermCath ? ?Hyperkalemia with end-stage renal disease on hemodialysis.  Potassium on admission 6.7.  ?Dialysis received yesterday, UF goal 1 L achieved.  Next treatment scheduled for Saturday. ? ?2. Anemia of chronic kidney disease ?Microcytic ?Lab Results  ?Component Value Date  ? HGB 11.1 (L) 06/26/2021  ?  ?Hemoglobin remains within desired range.  We will continue to monitor ? ?3. Secondary Hyperparathyroidism:  ?Lab Results  ?Component Value Date  ? CALCIUM 8.5 (L) 06/26/2021  ? CAION 1.11 (L) 02/12/2021  ? PHOS 6.7 (H) 06/24/2021  ?  ?We will continue to monitor bone minerals during this admission ? ?4.  Hypertension with chronic kidney disease.  Home regimen includes amlodipine, carvedilol, clonidine, and metoprolol.  Metoprolol currently held.  ? BP remains elevated at  this time, 189/92.  Losartan started yesterday evening.  We will continue to monitor ? ?5. Myoclonic jerks ? Believed due to uremia, persisted and worsened after dialysis. Ammonia level normal. Neurology consul

## 2021-06-26 NOTE — Progress Notes (Addendum)
Physical Therapy Treatment ?Patient Details ?Name: [name]. ?MRN: [id number] ?DOB: [birth year] ?Today's Date: 06/26/2021 ? ? ?History of Present Illness Pt is admitted for hyperkalemia. Pt with complaints of myoclonic twitches, new falls, and possible seizure on 06/23/21. History includes CVA, DM, HLD, ESRD on HD, HTN, heart attack. ? ?  ?PT Comments  ? ? Pt seen for PT tx with pt agreeable, with flat affect throughout session. Pt is able to complete supine>sit with supervision but extra time & heavy reliance on bed rails. Pt requires max assist for all STS transfers throughout session with ongoing cuing for safe hand placement, cuing & assist to scoot out to edge of seat, and pt continuing to lean back on EOB/chair and requiring cuing for anterior pelvic shift. Pt is able to ambulate a short distance in room but demonstrates impaired balance, strength, and awareness. Pt fatigues quickly. Pt appears to have a functional decline compared to PT evaluation so updating d/c recommendations as appropriate. Will continue to follow pt acutely to address balance, strength, and gait. ? ?Addendum: Per chart, pt does not meet criteria so have updated d/c recommendations to reflect that. Now recommending STR & frequency at 2x/week. ? ?   ?Recommendations for follow up therapy are one component of a multi-disciplinary discharge planning process, led by the attending physician.  Recommendations may be updated based on patient status, additional functional criteria and insurance authorization. ? ?Follow Up Recommendations ? Skilled nursing-short term rehab (<3 hours/day) ?  ?  ?Assistance Recommended at Discharge Frequent or constant Supervision/Assistance  ?Patient can return home with the following A lot of help with walking and/or transfers;A lot of help with bathing/dressing/bathroom;Assistance with cooking/housework;Assist for transportation;Direct supervision/assist for medications management;Help with stairs or ramp for  entrance ?  ?Equipment Recommendations ? Hospital bed;Wheelchair (measurements PT);Wheelchair cushion (measurements PT)  ?  ?Recommendations for Other Services   ? ? ?  ?Precautions / Restrictions Precautions ?Precautions: Fall ?Restrictions ?Weight Bearing Restrictions: No  ?  ? ?Mobility ? Bed Mobility ?Overal bed mobility: Needs Assistance ?Bed Mobility: Supine to Sit ?  ?  ?Supine to sit: HOB elevated, Supervision ?  ?  ?General bed mobility comments: Significantly extra time & cuing to use bed rails, HOB slightly elevated to come to sitting upright on EOB. Once sititng upright, requires cuing for lateral leans & assistance to scoot fully out to EOB. ?  ? ?Transfers ?Overall transfer level: Needs assistance ?Equipment used: Rolling walker (2 wheels) ?Transfers: Sit to/from Stand, Bed to chair/wheelchair/BSC ?Sit to Stand: Mod assist, Max assist ?  ?Step pivot transfers: Mod assist, Max assist ?  ?  ?  ?General transfer comment: Cuing for safe hand placement to push to standing. Pt with significant posterior lean onto bed despite cuing for anterior pelvic shift & glute activation to assist. ?  ? ?Ambulation/Gait ?Ambulation/Gait assistance: Mod assist, Min assist ?Gait Distance (Feet): 12 Feet ?Assistive device: Rolling walker (2 wheels) ?Gait Pattern/deviations: Decreased step length - right, Decreased step length - left, Decreased dorsiflexion - right, Decreased dorsiflexion - left, Decreased stride length ?Gait velocity: decreased ?  ?  ?General Gait Details: Pt ambulates around bed & back with RW & min assist but mod assist at times 2/2 LOB. ? ? ?Stairs ?  ?  ?  ?  ?  ? ? ?Wheelchair Mobility ?  ? ?Modified Rankin (Stroke Patients Only) ?  ? ? ?  ?Balance Overall balance assessment: History of Falls, Needs assistance ?Sitting-balance support: Feet supported, Bilateral upper  extremity supported ?Sitting balance-Leahy Scale: Fair ?  ?Postural control: Posterior lean ?Standing balance support: Bilateral upper  extremity supported, Reliant on assistive device for balance, During functional activity ?Standing balance-Leahy Scale: Poor ?  ?  ?  ?  ?  ?  ?  ?  ?  ?  ?  ?  ?  ? ?  ?Cognition Arousal/Alertness: Awake/alert ?Behavior During Therapy: Flat affect ?Overall Cognitive Status: No family/caregiver present to determine baseline cognitive functioning ?  ?  ?  ?  ?  ?  ?  ?  ?  ?  ?  ?  ?  ?  ?  ?  ?General Comments: decreased insight into deficits and safety ?  ?  ? ?  ?Exercises Other Exercises ?Other Exercises: Pt performs ~3 sit<>stand with RW & cuing for hand placement with focus on BLE strengthing & improving mechanics of STS. Pt requires increasing assistance as activity progresses 2/2 fatigue. ? ?  ?General Comments   ?  ?  ? ?Pertinent Vitals/Pain Pain Assessment ?Pain Assessment: No/denies pain  ? ? ?Home Living   ?  ?  ?  ?  ?  ?  ?  ?  ?  ?   ?  ?Prior Function    ?  ?  ?   ? ?PT Goals (current goals can now be found in the care plan section) Acute Rehab PT Goals ?Patient Stated Goal: to go home ?PT Goal Formulation: With patient ?Time For Goal Achievement: 07/08/21 ?Potential to Achieve Goals: Fair ?Additional Goals ?Additional Goal #1: Pt will be able to perform bed mobility/transfers with mod I and safe technique. ?Progress towards PT goals: Progressing toward goals ? ?  ?Frequency ? ? ? 7X/week ? ? ? ?  ?PT Plan Discharge plan needs to be updated;Frequency needs to be updated  ? ? ?Co-evaluation   ?  ?  ?  ?  ? ?  ?AM-PAC PT "6 Clicks" Mobility   ?Outcome Measure ? Help needed turning from your back to your side while in a flat bed without using bedrails?: A Little ?Help needed moving from lying on your back to sitting on the side of a flat bed without using bedrails?: A Lot ?Help needed moving to and from a bed to a chair (including a wheelchair)?: A Lot ?Help needed standing up from a chair using your arms (e.g., wheelchair or bedside chair)?: A Lot ?Help needed to walk in hospital room?: A Lot ?Help  needed climbing 3-5 steps with a railing? : Total ?6 Click Score: 12 ? ?  ?End of Session Equipment Utilized During Treatment: Gait belt ?Activity Tolerance: Patient tolerated treatment well ?Patient left: in chair;with chair alarm set;with call bell/phone within reach ?Nurse Communication: Mobility status ?PT Visit Diagnosis: Unsteadiness on feet (R26.81);Muscle weakness (generalized) (M62.81);History of falling (Z91.81);Difficulty in walking, not elsewhere classified (R26.2);Apraxia (R48.2) ?  ? ? ?Time: 3335-4562 ?PT Time Calculation (min) (ACUTE ONLY): 19 min ? ?Charges:  $Therapeutic Activity: 8-22 mins          ?          ? ?Lavone Nian, PT, DPT ?06/26/21, 3:42 PM ? ? ?Waunita Schooner ?06/26/2021, 11:43 AM ? ?

## 2021-06-26 NOTE — Progress Notes (Signed)
?PROGRESS NOTE ? ? ? ?[name].  WUJ:[id number] DOB: 1970/07/29 DOA: 06/23/2021 ?PCP: [name], MD  ? ?Assessment & Plan: ?  ?Principal Problem: ?  Hyperkalemia ?Active Problems: ?  ESRD (end stage renal disease) ([place]) ?  Frequent falls ?  Myoclonus ?  Essential hypertension, benign ?  Paroxysmal atrial fibrillation (HCC) ?  CVA (cerebral vascular accident) [facility]) ? ?Assessment and Plan:Hyperkalemia ? ?Hyperkalemia: resolved  ?  ?ESRD: on HD TTS. Nephro following and recs apprec  ?  ?Possible aspiration pneumonia: continue on IV unasyn, encourage incentive spirometry  ? ?Frequent falls: has fallen several times in the last week. PT now recs CIR  ?  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 EEG is WNL and no seizures or epileptiform discharges seen throughout recording. Continue on klonopin as per neuro (started this admission by neuro). Will need outpatient neuro f/u   ? ?ACD: likely secondary to ESRD. H&H are stable  ? ?Thrombocytopenia: etiology unclear. Will continue to monitor  ?  ?HTN: continue on coreg, amlodipine, clonidine  ? ?Hx of CVA: continue on statin, plavix  ?  ?PAF: continue on coreg, amio, & eliquis  ? ? ? ? ? ? ?DVT prophylaxis: eliquis  ?Code Status: full  ?Family Communication: called pt's wife, [name], no answer so I left a voicemail  ?Disposition Plan: possible d/c to CIR ? ?Level of care: Progressive ? ?Status is: Observation ?The patient remains OBS appropriate and will d/c before 2 midnights. ? ? ?Consultants:  ?Nephro ?Neuro  ? ? ?Procedures:  ? ?Antimicrobials: unasyn  ? ? ?Subjective: ?[name] generalized weakness ? ?Objective: ?Vitals:  ? 06/25/21 2301 06/26/21 0430 06/26/21 0830 06/26/21 1235  ?BP: (!) 180/91 (!) 186/92 (!) 189/92 115/77  ?Pulse: 70  72 (!) 58  ?Resp: 16 16 16 16   ?Temp: 98.5 ?F (36.9 ?C) 98.8 ?F (37.1 ?C) 99.2 ?F (37.3 ?C) 98.4 ?F (36.9 ?C)  ?TempSrc: Oral Oral Oral Oral  ?SpO2: 95% 94% 96% 97%  ?Weight:      ?Height:       ? ? ?Intake/Output Summary (Last 24 hours) at 06/26/2021 1417 ?Last data filed at 06/26/2021 1350 ?Gross per 24 hour  ?Intake 940 ml  ?Output --  ?Net 940 ml  ? ?Filed Weights  ? 06/24/21 2112 06/25/21 0934 06/25/21 1302  ?Weight: 100.3 kg 101.2 kg 100.2 kg  ? ? ?Examination: ? ?General exam: Appears calm & comfortable  ?Respiratory system: diminished breath sounds b/l  ?Cardiovascular system: irregularly irregular. No rubs or clicks ?Gastrointestinal system: Abd is soft, NT, ND & hypoactive bowel sounds ?Central nervous system: Alert and oriented. Moves all extremities  ?Psychiatry: Judgement and insight appears normal. Flat mood and affect  ? ? ? ?Data Reviewed: I have personally reviewed following labs and imaging studies ? ?CBC: ?Recent Labs  ?Lab 06/23/21 ?1124 06/24/21 ?0321 06/25/21 ?0411 06/26/21 ?9562  ?WBC 7.0 6.4 8.1 8.0  ?HGB 11.8* 11.8* 11.6* 11.1*  ?HCT 39.8 38.6* 38.7* 37.2*  ?MCV 75.0* 72.4* 73.3* 73.5*  ?PLT 165 162 150 136*  ? ?Basic Metabolic Panel: ?Recent Labs  ?Lab 06/23/21 ?1124 06/23/21 ?1558 06/23/21 ?1805 06/23/21 ?2238 06/24/21 ?0321 06/25/21 ?0411 06/26/21 ?1308  ?NA 139  --   --  137 136 137 135  ?K 6.7*   < > 4.3 4.6 5.2* 5.9* 4.5  ?CL 108  --   --  101 103 102 98  ?CO2 18*  --   --  21* 24 23 25   ?GLUCOSE 77  --   --  87 89 73 73  ?BUN 84*  --   --  46* 51* 65* 42*  ?CREATININE 11.76*  --   --  7.75* 8.40* 10.33* 8.25*  ?CALCIUM 8.8*  --   --  9.0 8.6* 8.3* 8.5*  ?MG  --   --   --  1.9  --   --   --   ?PHOS  --   --   --   --  6.7*  --   --   ? < > = values in this interval not displayed.  ? ?GFR: ?Estimated Creatinine Clearance: 12.4 mL/min (A) (by C-G formula based on SCr of 8.25 mg/dL (H)). ?Liver Function Tests: ?Recent Labs  ?Lab 06/23/21 ?2238  ?AST 15  ?ALT 9  ?ALKPHOS 84  ?BILITOT 1.1  ?PROT 7.0  ?ALBUMIN 3.5  ? ?No results for input(s): LIPASE, AMYLASE in the last 168 hours. ?Recent Labs  ?Lab 06/23/21 ?2238  ?AMMONIA 24  ? ?Coagulation Profile: ?No results for input(s): INR,  PROTIME in the last 168 hours. ?Cardiac Enzymes: ?No results for input(s): CKTOTAL, CKMB, CKMBINDEX, TROPONINI in the last 168 hours. ?BNP (last 3 results) ?No results for input(s): PROBNP in the last 8760 hours. ?HbA1C: ?No results for input(s): HGBA1C in the last 72 hours. ?CBG: ?Recent Labs  ?Lab 06/23/21 ?1127 06/23/21 ?2204 06/23/21 ?2232  ?GLUCAP 71 74 78  ? ?Lipid Profile: ?No results for input(s): CHOL, HDL, LDLCALC, TRIG, CHOLHDL, LDLDIRECT in the last 72 hours. ?Thyroid Function Tests: ?No results for input(s): TSH, T4TOTAL, FREET4, T3FREE, THYROIDAB in the last 72 hours. ?Anemia Panel: ?No results for input(s): VITAMINB12, FOLATE, FERRITIN, TIBC, IRON, RETICCTPCT in the last 72 hours. ?Sepsis Labs: ?No results for input(s): PROCALCITON, LATICACIDVEN in the last 168 hours. ? ?Recent Results (from the past 240 hour(s))  ?MRSA Next Gen by PCR, Nasal     Status: None  ? Collection Time: 06/23/21 10:30 PM  ? Specimen: Nasal Mucosa; Nasal Swab  ?Result Value Ref Range Status  ? MRSA by PCR Next Gen NOT DETECTED NOT DETECTED Final  ?  Comment: (NOTE) ?The GeneXpert MRSA Assay (FDA approved for NASAL specimens only), ?is one component of a comprehensive MRSA colonization surveillance ?program. It is not intended to diagnose MRSA infection nor to guide ?or monitor treatment for MRSA infections. ?Test performance is not FDA approved in patients less than 2 years ?old. ?Performed at [facility], [place], ?[place] [address] ?  ?  ? ? ? ? ? ?Radiology Studies: ?No results found. ? ? ? ? ? ?Scheduled Meds: ? amiodarone  200 mg Oral Daily  ? amLODipine  5 mg Oral Daily  ? apixaban  2.5 mg Oral BID  ? atorvastatin  80 mg Oral Daily  ? calcium acetate  1,334 mg Oral TID WC  ? carvedilol  25 mg Oral BID  ? Chlorhexidine Gluconate Cloth  6 each Topical Q0600  ? clonazePAM  0.5 mg Oral BID  ? clopidogrel  75 mg Oral Daily  ? cyclobenzaprine  10 mg Oral TID  ? dorzolamide-timolol  1 drop Right Eye  BID  ? irbesartan  150 mg Oral Daily  ? phenylephrine  1 suppository Rectal Daily  ? prednisoLONE acetate  1 drop Left Eye Q4H  ? sodium chloride flush  3 mL Intravenous Q12H  ? tamsulosin  0.4 mg Oral Daily  ? ?Continuous Infusions: ? sodium chloride    ? ampicillin-sulbactam (UNASYN) IV 3 g (06/26/21 1241)  ? ? ?  LOS: 0 days  ? ? ?Time spent: 29 mins  ? ? ? ?Wyvonnia Dusky, MD ?[facility] ?Pager [phone] ? ?If 7PM-7AM, please contact night-coverage ?06/26/2021, 2:17 PM  ? ?

## 2021-06-26 NOTE — Progress Notes (Addendum)
Occupational Therapy Treatment ?Patient Details ?Name: [name]. ?MRN: [id number] ?DOB: 1970-12-31 ?Today's Date: 06/26/2021 ? ? ?History of present illness Pt is admitted for hyperkalemia. Pt with complaints of myoclonic twitches, new falls, and possible seizure on 06/23/21. History includes CVA, DM, HLD, ESRD on HD, HTN, heart attack. ?  ?OT comments ? Pt seen for OT tx this date. Pt required MOD A for bed mobility to sit EOB. Bed required to be elevated to attempt STS transfer with RW requiring MAX A to complete with initial heavy posterior lean improving to MIN A to correct with VC as he was able to shift weight anteriorly. Noted to have BM requiring him to sit. RN provided BSC. MAX A to stand again and SPT to [facility] with RW + continued VC for sequencing as he attempts to sit too soon and again has heavy posterior lean. After BM, Pt required MAX A to stand with initial MOD A +2 improving to MIN-MOD A +1 for static standing balance with VC to improve anterior weight shift. TOTAL A from nurse tech for pericare while OT assisted with static standing balance and RW mgt. MOD-MAX A for SPT back to bed. Pt continues to require significant assist for all aspects of mobility and LB ADL tasks. Continue to recommend higher level of rehab and care. Family/pt want to try for CIR per TOC. No family present and pt with limited insights into deficits. Unclear what true baseline was prior to this or if family able to provide current level of assist to facilitate safe return home at this time. Care team notified.   ? ?Recommendations for follow up therapy are one component of a multi-disciplinary discharge planning process, led by the attending physician.  Recommendations may be updated based on patient status, additional functional criteria and insurance authorization. ?   ?Follow Up Recommendations ? Inpatient rehab ?  ?Assistance Recommended at Discharge Frequent or constant Supervision/Assistance  ?Patient can return home  with the following ? Two people to help with walking and/or transfers;Assistance with cooking/housework;Two people to help with bathing/dressing/bathroom;Assist for transportation;Help with stairs or ramp for entrance;Direct supervision/assist for medications management ?  ?Equipment Recommendations ? BSC/3in1  ?  ?Recommendations for Other Services   ? ?  ?Precautions / Restrictions Precautions ?Precautions: Fall ?Restrictions ?Weight Bearing Restrictions: No  ? ? ?  ? ?Mobility Bed Mobility ?Overal bed mobility: Needs Assistance ?Bed Mobility: Supine to Sit, Sit to Supine ?  ?  ?Supine to sit: Mod assist ?Sit to supine: Mod assist ?  ?General bed mobility comments: increased time/effort to perform, ultimately requiring signifcant assist to sit, VC for sequencing ?  ? ?Transfers ?Overall transfer level: Needs assistance ?Equipment used: Rolling walker (2 wheels) ?Transfers: Sit to/from Stand ?Sit to Stand: Max assist, From elevated surface ?  ?  ?  ?  ?  ?General transfer comment: heavy posterior lean requiring MAX A + VC for anterior weight shift ?  ?  ?Balance Overall balance assessment: History of Falls, Needs assistance ?Sitting-balance support: Feet supported, Bilateral upper extremity supported ?Sitting balance-Leahy Scale: Fair ?  ?Postural control: Posterior lean ?Standing balance support: Bilateral upper extremity supported, Reliant on assistive device for balance, During functional activity ?Standing balance-Leahy Scale: Poor ?Standing balance comment: Initial MOD A +2 improving to MIN-MOD A +1  for static standing balance with VC to improve anterior weight shift ?  ?  ?  ?  ?  ?  ?  ?  ?  ?  ?  ?   ? ?  ADL either performed or assessed with clinical judgement  ? ?ADL Overall ADL's : Needs assistance/impaired ?  ?  ?Grooming: Sitting;Wash/dry face;Set up ?  ?  ?  ?  ?  ?Upper Body Dressing : Minimal assistance;Sitting ?Upper Body Dressing Details (indicate cue type and reason): hospital gown ?  ?  ?Toilet  Transfer: Moderate assistance;Maximal assistance;Stand-pivot;BSC/3in1;Rolling walker (2 wheels) ?Toilet Transfer Details (indicate cue type and reason): MAX VC for technique/sequencing/balance ?Toileting- Water quality scientist and Hygiene: Sit to/from stand;Total assistance ?Toileting - Clothing Manipulation Details (indicate cue type and reason): Initial MOD A +2 improving to MIN-MOD A +1  for static standing balance with VC to improve anterior weight shift, nurse tech providing TOTAL for pericare as pt was unable to let go of RW to assist ?  ?  ?  ?  ?  ? ?Extremity/Trunk Assessment   ?  ?  ?  ?  ?  ? ?Vision   ?  ?  ?Perception   ?  ?Praxis   ?  ? ?Cognition Arousal/Alertness: Awake/alert ?Behavior During Therapy: [facility] for tasks assessed/performed ?Overall Cognitive Status: No family/caregiver present to determine baseline cognitive functioning ?  ?  ?  ?  ?  ?  ?  ?  ?  ?  ?  ?  ?  ?  ?  ?  ?General Comments: decreased insight into deficits and safety ?  ?  ?   ?Exercises   ? ?  ?Shoulder Instructions   ? ? ?  ?General Comments    ? ? ?Pertinent Vitals/ Pain       Pain Assessment ?Pain Assessment: No/denies pain ? ?Home Living   ?  ?  ?  ?  ?  ?  ?  ?  ?  ?  ?  ?  ?  ?  ?  ?  ?  ?  ? ?  ?Prior Functioning/Environment    ?  ?  ?  ?   ? ?Frequency ? Min 3X/week  ? ? ? ? ?  ?Progress Toward Goals ? ?OT Goals(current goals can now be found in the care plan section) ? Progress towards OT goals: Progressing toward goals ? ?Acute Rehab OT Goals ?Patient Stated Goal: go home ?OT Goal Formulation: With patient ?Time For Goal Achievement: 07/09/21 ?Potential to Achieve Goals: Fair  ?Plan Discharge plan needs to be updated;Frequency needs to be updated   ? ?Co-evaluation ? ? ?   ?  ?  ?  ?  ? ?  ?AM-PAC OT "6 Clicks" Daily Activity     ?Outcome Measure ? ? Help from another person eating meals?: None ?Help from another person taking care of personal grooming?: A Little ?Help from another person toileting, which includes  using toliet, bedpan, or urinal?: Total ?Help from another person bathing (including washing, rinsing, drying)?: A Lot ?Help from another person to put on and taking off regular upper body clothing?: A Little ?Help from another person to put on and taking off regular lower body clothing?: A Lot ?6 Click Score: 15 ? ?  ?End of Session Equipment Utilized During Treatment: Rolling walker (2 wheels);Gait belt ? ?OT Visit Diagnosis: Other abnormalities of gait and mobility (R26.89);Muscle weakness (generalized) (M62.81) ?  ?Activity Tolerance Patient tolerated treatment well ?  ?Patient Left in bed;with call bell/phone within reach;with bed alarm set;with nursing/sitter in room ?  ?Nurse Communication Mobility status ?  ? ?   ? ?Time: 4585-9292 ?OT Time Calculation (min): 31 min ? ?  Charges: OT General Charges ?$OT Visit: 1 Visit ?OT Treatments ?$Self Care/Home Management : 23-37 mins ? ?Ardeth Perfect., MPH, MS, OTR/L ?ascom [phone] ?06/26/21, 11:56 AM ? ?

## 2021-06-26 NOTE — Progress Notes (Addendum)
Inpatient Rehab Admissions Coordinator:  ? ?Per updated therapy recommendations pt was screened for CIR by Shann Medal, PT, DPT.  Pt admitted to CIR earlier this year for CVA.  Unfortunately, pt does not demonstrate the medical necessity to obtain approval from [facility] for CIR.  Will need to look into other rehab venues.   ? ?Shann Medal, PT, DPT ?Admissions Coordinator ?[phone] ?06/26/21  ?3:04 PM ? ?

## 2021-06-27 DIAGNOSIS — G253 Myoclonus: Secondary | ICD-10-CM | POA: Diagnosis not present

## 2021-06-27 DIAGNOSIS — Z79899 Other long term (current) drug therapy: Secondary | ICD-10-CM | POA: Diagnosis not present

## 2021-06-27 DIAGNOSIS — Z7902 Long term (current) use of antithrombotics/antiplatelets: Secondary | ICD-10-CM | POA: Diagnosis not present

## 2021-06-27 DIAGNOSIS — I480 Paroxysmal atrial fibrillation: Secondary | ICD-10-CM | POA: Diagnosis not present

## 2021-06-27 DIAGNOSIS — E1122 Type 2 diabetes mellitus with diabetic chronic kidney disease: Secondary | ICD-10-CM | POA: Diagnosis not present

## 2021-06-27 DIAGNOSIS — D6959 Other secondary thrombocytopenia: Secondary | ICD-10-CM | POA: Diagnosis not present

## 2021-06-27 DIAGNOSIS — E1140 Type 2 diabetes mellitus with diabetic neuropathy, unspecified: Secondary | ICD-10-CM | POA: Diagnosis not present

## 2021-06-27 DIAGNOSIS — N2581 Secondary hyperparathyroidism of renal origin: Secondary | ICD-10-CM | POA: Diagnosis not present

## 2021-06-27 DIAGNOSIS — R296 Repeated falls: Secondary | ICD-10-CM | POA: Diagnosis not present

## 2021-06-27 DIAGNOSIS — N186 End stage renal disease: Secondary | ICD-10-CM | POA: Diagnosis not present

## 2021-06-27 DIAGNOSIS — Z7901 Long term (current) use of anticoagulants: Secondary | ICD-10-CM | POA: Diagnosis not present

## 2021-06-27 DIAGNOSIS — E785 Hyperlipidemia, unspecified: Secondary | ICD-10-CM | POA: Diagnosis not present

## 2021-06-27 DIAGNOSIS — I132 Hypertensive heart and chronic kidney disease with heart failure and with stage 5 chronic kidney disease, or end stage renal disease: Secondary | ICD-10-CM | POA: Diagnosis not present

## 2021-06-27 DIAGNOSIS — J690 Pneumonitis due to inhalation of food and vomit: Secondary | ICD-10-CM | POA: Diagnosis present

## 2021-06-27 DIAGNOSIS — D631 Anemia in chronic kidney disease: Secondary | ICD-10-CM | POA: Diagnosis not present

## 2021-06-27 DIAGNOSIS — I509 Heart failure, unspecified: Secondary | ICD-10-CM | POA: Diagnosis not present

## 2021-06-27 DIAGNOSIS — Z823 Family history of stroke: Secondary | ICD-10-CM | POA: Diagnosis not present

## 2021-06-27 DIAGNOSIS — Z992 Dependence on renal dialysis: Secondary | ICD-10-CM | POA: Diagnosis not present

## 2021-06-27 DIAGNOSIS — E875 Hyperkalemia: Secondary | ICD-10-CM | POA: Diagnosis present

## 2021-06-27 DIAGNOSIS — Z833 Family history of diabetes mellitus: Secondary | ICD-10-CM | POA: Diagnosis not present

## 2021-06-27 DIAGNOSIS — I252 Old myocardial infarction: Secondary | ICD-10-CM | POA: Diagnosis not present

## 2021-06-27 DIAGNOSIS — E1151 Type 2 diabetes mellitus with diabetic peripheral angiopathy without gangrene: Secondary | ICD-10-CM | POA: Diagnosis not present

## 2021-06-27 DIAGNOSIS — K219 Gastro-esophageal reflux disease without esophagitis: Secondary | ICD-10-CM | POA: Diagnosis not present

## 2021-06-27 DIAGNOSIS — Z8249 Family history of ischemic heart disease and other diseases of the circulatory system: Secondary | ICD-10-CM | POA: Diagnosis not present

## 2021-06-27 LAB — BASIC METABOLIC PANEL
Anion gap: 13 (ref 5–15)
BUN: 54 mg/dL — ABNORMAL HIGH (ref 6–20)
CO2: 23 mmol/L (ref 22–32)
Calcium: 8.4 mg/dL — ABNORMAL LOW (ref 8.9–10.3)
Chloride: 98 mmol/L (ref 98–111)
Creatinine, Ser: 9.95 mg/dL — ABNORMAL HIGH (ref 0.61–1.24)
GFR, Estimated: 6 mL/min — ABNORMAL LOW (ref >60)
Glucose, Bld: 72 mg/dL (ref 70–99)
Potassium: 4.3 mmol/L (ref 3.5–5.1)
Sodium: 134 mmol/L — ABNORMAL LOW (ref 135–145)

## 2021-06-27 LAB — CBC
HCT: 36.9 % — ABNORMAL LOW (ref 39.0–52.0)
Hemoglobin: 11 g/dL — ABNORMAL LOW (ref 13.0–17.0)
MCH: 21.9 pg — ABNORMAL LOW (ref 26.0–34.0)
MCHC: 29.8 g/dL — ABNORMAL LOW (ref 30.0–36.0)
MCV: 73.5 fL — ABNORMAL LOW (ref 80.0–100.0)
Platelets: 134 10*3/uL — ABNORMAL LOW (ref 150–400)
RBC: 5.02 MIL/uL (ref 4.22–5.81)
RDW: 20.1 % — ABNORMAL HIGH (ref 11.5–15.5)
WBC: 7.6 10*3/uL (ref 4.0–10.5)
nRBC: 0 % (ref 0.0–0.2)

## 2021-06-27 MED ORDER — AMOXICILLIN-POT CLAVULANATE 500-125 MG PO TABS: 1.0000 | ORAL_TABLET | Freq: Every day | ORAL | 0 refills | Status: DC

## 2021-06-27 MED ORDER — CLONAZEPAM 0.5 MG PO TBDP: 0.5000 mg | ORAL_TABLET | Freq: Two times a day (BID) | ORAL | 0 refills | Status: DC

## 2021-06-27 MED ORDER — IRBESARTAN 150 MG PO TABS: 150.0000 mg | ORAL_TABLET | Freq: Every day | ORAL | 0 refills | Status: DC

## 2021-06-27 MED ORDER — CLONIDINE HCL 0.1 MG PO TABS
ORAL_TABLET | ORAL | Status: AC
  Filled 2021-06-27: qty 1

## 2021-06-27 MED ORDER — AMOXICILLIN-POT CLAVULANATE 500-125 MG PO TABS: 1.0000 | ORAL_TABLET | Freq: Every day | ORAL | 0 refills | Status: AC

## 2021-06-27 MED ORDER — HYDRALAZINE HCL 50 MG PO TABS: 50.0000 mg | ORAL_TABLET | Freq: Four times a day (QID) | ORAL | Status: DC | PRN

## 2021-06-27 MED ORDER — HEPARIN SODIUM (PORCINE) 1000 UNIT/ML IJ SOLN
INTRAMUSCULAR | Status: AC
  Filled 2021-06-27: qty 10

## 2021-06-27 NOTE — Progress Notes (Signed)
?[facility]  ?ROUNDING NOTE  ? ?Subjective:  ? ?[name]. Is a 51 year old African-American male with past medical history including diabetes, hypertension, GERD, CVA, and end-stage renal disease on hemodialysis.  Patient presents to the emergency department via EMS with complaints of a   Patient has been admitted for Hyperkalemia [E87.5] ?Muscle spasm [M62.838] ? ?Patient is known to our practice and receives outpatient dialysis treatments at [facility] on a TTS schedule,   ? ?Update: ? ? Patient seen during dialysis ?Tolerating well  ?  ?HEMODIALYSIS FLOWSHEET: ? ?Blood Flow Rate (mL/min): 400 mL/min ?Arterial Pressure (mmHg): -240 mmHg ?Venous Pressure (mmHg): 170 mmHg ?Transmembrane Pressure (mmHg): 40 mmHg ?Ultrafiltration Rate (mL/min): 500 mL/min ?Dialysate Flow Rate (mL/min): 500 ml/min ?Conductivity: Machine : 13.7 ?Conductivity: Machine : 13.7 ?Dialysate Change: 2K  ? ? ? ?Objective:  ?Vital signs in last 24 hours:  ?Temp:  [97.8 ?F (36.6 ?C)-98.7 ?F (37.1 ?C)] 97.8 ?F (36.6 ?C) (05/06 2482) ?Pulse Rate:  [58-71] 71 (05/06 1200) ?Resp:  [10-21] 16 (05/06 1200) ?BP: (115-216)/(77-117) 207/117 (05/06 1200) ?SpO2:  [95 %-100 %] 98 % (05/06 1000) ?Weight:  [99.7 kg-100.3 kg] 99.7 kg (05/06 0903) ? ?Weight change: -0.9 kg ?Filed Weights  ? 06/25/21 1302 06/27/21 0022 06/27/21 0903  ?Weight: 100.2 kg 100.3 kg 99.7 kg  ? ? ?Intake/Output: ?I/O last 3 completed shifts: ?In: 28 [P.O.:720; IV Piggyback:100] ?Out: -  ?  ?Intake/Output this shift: ? Total I/O ?In: -  ?Out: 400 [Urine:400] ? ?Physical Exam: ?General: NAD, resting quietly  ?Head: Normocephalic, atraumatic. Moist oral mucosal membranes  ?Eyes: Anicteric  ?Lungs:  Clear to auscultation, normal effort  ?Heart: Regular rate and rhythm  ?Abdomen:  Soft, nontender  ?Extremities: Trace peripheral edema.  ?Neurologic: Nonfocal, moving all four extremities  ?Skin: No lesions  ?Access: Right PermCath  ? ? ?Basic Metabolic  Panel: ?Recent Labs  ?Lab 06/23/21 ?2238 06/24/21 ?0321 06/25/21 ?0411 06/26/21 ?5003 06/27/21 ?0532  ?NA 137 136 137 135 134*  ?K 4.6 5.2* 5.9* 4.5 4.3  ?CL 101 103 102 98 98  ?CO2 21* 24 23 25 23   ?GLUCOSE 87 89 73 73 72  ?BUN 46* 51* 65* 42* 54*  ?CREATININE 7.75* 8.40* 10.33* 8.25* 9.95*  ?CALCIUM 9.0 8.6* 8.3* 8.5* 8.4*  ?MG 1.9  --   --   --   --   ?PHOS  --  6.7*  --   --   --   ? ? ? ?Liver Function Tests: ?Recent Labs  ?Lab 06/23/21 ?2238  ?AST 15  ?ALT 9  ?ALKPHOS 84  ?BILITOT 1.1  ?PROT 7.0  ?ALBUMIN 3.5  ? ? ?No results for input(s): LIPASE, AMYLASE in the last 168 hours. ?Recent Labs  ?Lab 06/23/21 ?2238  ?AMMONIA 24  ? ? ? ?CBC: ?Recent Labs  ?Lab 06/23/21 ?1124 06/24/21 ?0321 06/25/21 ?0411 06/26/21 ?7048 06/27/21 ?0532  ?WBC 7.0 6.4 8.1 8.0 7.6  ?HGB 11.8* 11.8* 11.6* 11.1* 11.0*  ?HCT 39.8 38.6* 38.7* 37.2* 36.9*  ?MCV 75.0* 72.4* 73.3* 73.5* 73.5*  ?PLT 165 162 150 136* 134*  ? ? ? ?Cardiac Enzymes: ?No results for input(s): CKTOTAL, CKMB, CKMBINDEX, TROPONINI in the last 168 hours. ? ?BNP: ?Invalid input(s): POCBNP ? ?CBG: ?Recent Labs  ?Lab 06/23/21 ?1127 06/23/21 ?2204 06/23/21 ?2232  ?GLUCAP 71 74 78  ? ? ? ?Microbiology: ?Results for orders placed or performed during the hospital encounter of 06/23/21  ?MRSA Next Gen by PCR, Nasal     Status: None  ?  Collection Time: 06/23/21 10:30 PM  ? Specimen: Nasal Mucosa; Nasal Swab  ?Result Value Ref Range Status  ? MRSA by PCR Next Gen NOT DETECTED NOT DETECTED Final  ?  Comment: (NOTE) ?The GeneXpert MRSA Assay (FDA approved for NASAL specimens only), ?is one component of a comprehensive MRSA colonization surveillance ?program. It is not intended to diagnose MRSA infection nor to guide ?or monitor treatment for MRSA infections. ?Test performance is not FDA approved in patients less than 2 years ?old. ?Performed at [facility], [place], ?[place] [address] ?  ? ? ?Coagulation Studies: ?No results for input(s): LABPROT, INR in  the last 72 hours. ? ?Urinalysis: ?No results for input(s): COLORURINE, LABSPEC, [place], GLUCOSEU, HGBUR, BILIRUBINUR, KETONESUR, PROTEINUR, UROBILINOGEN, NITRITE, LEUKOCYTESUR in the last 72 hours. ? ?Invalid input(s): APPERANCEUR  ? ? ?Imaging: ?No results found. ? ? ?Medications:  ? ? sodium chloride    ? ampicillin-sulbactam (UNASYN) IV 3 g (06/27/21 0110)  ? ? amiodarone  200 mg Oral Daily  ? amLODipine  5 mg Oral Daily  ? apixaban  2.5 mg Oral BID  ? atorvastatin  80 mg Oral Daily  ? calcium acetate  1,334 mg Oral TID WC  ? carvedilol  25 mg Oral BID  ? Chlorhexidine Gluconate Cloth  6 each Topical Q0600  ? clonazePAM  0.5 mg Oral BID  ? cloNIDine      ? clopidogrel  75 mg Oral Daily  ? cyclobenzaprine  10 mg Oral TID  ? dorzolamide-timolol  1 drop Right Eye BID  ? heparin sodium (porcine)      ? irbesartan  150 mg Oral Daily  ? phenylephrine  1 suppository Rectal Daily  ? prednisoLONE acetate  1 drop Left Eye Q4H  ? sodium chloride flush  3 mL Intravenous Q12H  ? tamsulosin  0.4 mg Oral Daily  ? ?sodium chloride, acetaminophen **OR** acetaminophen, cloNIDine, melatonin, nitroGLYCERIN, sodium chloride flush ? ?Assessment/ Plan:  ?[name]. is a 51 y.o.  male with past medical history including diabetes, hypertension, GERD, CVA, and end-stage renal disease on hemodialysis.  Patient presents to the emergency department via EMS with complaints of a   Patient has been admitted for Hyperkalemia [E87.5] ?Muscle spasm [M62.838] ? ?[facility] TTS right PermCath ? ?Hyperkalemia with end-stage renal disease on hemodialysis.  Potassium on admission 6.7.  ?-Routine hemodialysis today ? ?2. Anemia of chronic kidney disease ?Microcytic ?Lab Results  ?Component Value Date  ? HGB 11.0 (L) 06/27/2021  ?  ?Hemoglobin remains within desired range.  We will continue to monitor ? ?3. Secondary Hyperparathyroidism:  ?Lab Results  ?Component Value Date  ? CALCIUM 8.4 (L) 06/27/2021  ? CAION 1.11 (L)  02/12/2021  ? PHOS 6.7 (H) 06/24/2021  ?  ?We will continue to monitor bone minerals during this admission ?Continue calcium acetate as phos binder ? ?4.  Hypertension with chronic kidney disease.  Home regimen includes amlodipine, carvedilol, clonidine, and metoprolol.  Irbesartan added this admission and tolerating well.  Blood pressure control however is still variable. ?  ? ?5. Myoclonic jerks ? Believed due to uremia, persisted and worsened after dialysis. Ammonia level normal. Neurology consulted and EEG negative for seizure activity. Clonazepam ordered BID ? Patient reports mild improvement ? ? ? LOS: 0 ?Temisha Murley ?5/6/202312:12 PM ?  ?

## 2021-06-27 NOTE — Discharge Summary (Addendum)
Physician Discharge Summary  ?[name]. GEZ:[id number] DOB: 17-Apr-1970 DOA: 06/23/2021 ? ?PCP: [name], MD ? ?Admit date: 06/23/2021 ?Discharge date: 06/27/2021 ? ?Admitted From: home  ?Disposition:  home w/ home health  ? ?Recommendations for Outpatient Follow-up:  ?Follow up with PCP in 1-2 weeks ?F/u w/ neuro in 1 week ?F/u w/ nephro in 1-2 weeks ? ?Home Health: yes ?Equipment/Devices: ? ?Discharge Condition: stable  ?CODE STATUS: full  ?Diet recommendation: Heart Healthy  ? ?Brief/Interim Summary: ?HPI was taken from Dr. Francine Graven: ?[name]. is a 51 y.o. male with medical history significant for end-stage renal disease on hemodialysis (T/TH/S), diabetes mellitus, history of CVA, hypertension, GERD who presents to the ER via EMS for evaluation after he fell at home.  ?Patient's wife states that he started having involuntary jerking movements involving his extremities over the last 1 week.  He has had several falls related to these jerking movements but denies hitting his head or having any loss of consciousness.  ?He fell at home this morning while getting ready to go to the dialysis center and was brought into the ER for evaluation. ?He denies having any nausea, no vomiting, no diarrhea, no fever, no chills, no chest pain, no shortness of breath, no cough, no dizziness or lightheadedness. ? ?As per Dr. Jimmye Norman 5/3-06/27/21: Pt presented w/ hyperkalemia and received HD. Of note, pt was found to have involuntary jerking movements that were likely secondary to generalized polymyoclonus as per neuro. Pt was started on klonopin which improved the pt's myoclonus. Pt will need to f/u outpatient w/ neuro. Pt verbalized his understanding and stated he had an appt already set up. PT/OT initially evaluated the pt and recommended HH but on re-evaluation recommended SNF. Pt refused SNF but agreed to home health. For more information, please see previous progress/consult notes.  ? ?Discharge Diagnoses:   ?Principal Problem: ?  Hyperkalemia ?Active Problems: ?  ESRD (end stage renal disease) ([place]) ?  Frequent falls ?  Myoclonus ?  Essential hypertension, benign ?  Paroxysmal atrial fibrillation (HCC) ?  CVA (cerebral vascular accident) [facility]) ?  Aspiration pneumonia ([place]) ? ?Hyperkalemia: resolved  ?  ?ESRD: on HD TTS. Nephro following and recs apprec  ?  ?Possible aspiration pneumonia: continue on IV unasyn, encourage incentive spirometry  ? ?Frequent falls: has fallen several times in the last week. PT now recs CIR  ?  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 EEG is WNL and no seizures or epileptiform discharges seen throughout recording. Continue on klonopin as per neuro (started this admission by neuro). Will need outpatient neuro f/u   ? ?ACD: likely secondary to ESRD. H&H are stable  ? ?Thrombocytopenia: etiology unclear. Will continue to monitor  ?  ?HTN: poorly controlled. Continue on coreg, amlodipine, clonidine & started on irbesartan  ? ?Hx of CVA: continue on statin, plavix  ?  ?PAF: continue on coreg, amio, & eliquis  ? ?Discharge Instructions ? ?Discharge Instructions   ? ? Diet - low sodium heart healthy   Complete by: As directed ?  ? Discharge instructions   Complete by: As directed ?  ? F/u w/ PCP within 1 week. F/u w/ nephro in 1-2 weeks. F/u w/ neuro in 1-2 weeks.  ? Increase activity slowly   Complete by: As directed ?  ? ?  ? ?Allergies as of 06/27/2021   ? ?   Reactions  ? Levofloxacin Swelling  ? Facial swelling  ? Promethazine Diarrhea, Other (See Comments)  ?  Muscle cramps  ? Malt Other (See Comments)  ? Cramping  ? ?  ? ?  ?Medication List  ?  ? ?STOP taking these medications   ? ?hydrocortisone cream 1 % ?  ?metoprolol tartrate 25 MG tablet ?Commonly known as: LOPRESSOR ?  ?vancomycin 125 MG capsule ?Commonly known as: VANCOCIN ?  ? ?  ? ?TAKE these medications   ? ?acetaminophen 325 MG tablet ?Commonly known as: TYLENOL ?Take 1-2 tablets (325-650 mg total) by mouth  every 4 (four) hours as needed for mild pain. ?  ?amiodarone 200 MG tablet ?Commonly known as: PACERONE ?TAKE 1 TABLET BY MOUTH ONCE DAILY. ?  ?amLODipine 5 MG tablet ?Commonly known as: NORVASC ?Take 5 mg by mouth daily. ?  ?amoxicillin-clavulanate 500-125 MG tablet ?Commonly known as: Augmentin ?Take 1 tablet (500 mg total) by mouth daily for 2 days. ?  ?atorvastatin 80 MG tablet ?Commonly known as: LIPITOR ?Take 1 tablet (80 mg total) by mouth daily. ?  ?calcium acetate 667 MG capsule ?Commonly known as: PHOSLO ?Take 2 capsules (1,334 mg total) by mouth 3 (three) times daily with meals. ?  ?carvedilol 25 MG tablet ?Commonly known as: COREG ?Take 25 mg by mouth 2 (two) times daily. ?  ?clonazePAM 0.5 MG disintegrating tablet ?Commonly known as: KLONOPIN ?Take 1 tablet (0.5 mg total) by mouth 2 (two) times daily. ?  ?cloNIDine 0.1 MG tablet ?Commonly known as: CATAPRES ?Take 1 tablet (0.1 mg total) by mouth 3 (three) times daily as needed (systolic blood pressure (top number) is Greater than 200 or DBP (lower number)is Greater than 110). ?  ?clopidogrel 75 MG tablet ?Commonly known as: PLAVIX ?Take 1 tablet (75 mg total) by mouth daily. ?  ?cyclobenzaprine 10 MG tablet ?Commonly known as: FLEXERIL ?Take 10 mg by mouth 3 (three) times daily. ?  ?dorzolamide-timolol 22.3-6.8 MG/ML ophthalmic solution ?Commonly known as: COSOPT ?Place 1 drop into the right eye 2 (two) times daily. ?  ?Eliquis 2.5 MG Tabs tablet ?Generic drug: apixaban ?Take 2.5 mg by mouth 2 (two) times daily. ?What changed: Another medication with the same name was removed. Continue taking this medication, and follow the directions you see here. ?  ?erythromycin ophthalmic ointment ?SMARTSIG:In Eye(s) ?  ?irbesartan 150 MG tablet ?Commonly known as: AVAPRO ?Take 1 tablet (150 mg total) by mouth daily. ?  ?lactobacillus Pack ?Take 1 packet (1 g total) by mouth 3 (three) times daily with meals. ?  ?melatonin 5 MG Tabs ?Take 1 tablet (5 mg total) by  mouth at bedtime as needed. ?  ?neomycin-polymyxin b-dexamethasone 3.5-10000-0.1 Oint ?Commonly known as: MAXITROL ?Place 1 application. into the left eye in the morning and at bedtime. ?  ?nitroGLYCERIN 0.4 MG SL tablet ?Commonly known as: NITROSTAT ?Place 1 tablet (0.4 mg total) under the tongue every 5 (five) minutes as needed for chest pain. ?  ?phenylephrine 0.25 % suppository ?Commonly known as: (USE for PREPARATION-H) ?Place 1 suppository rectally 2 (two) times daily as needed for hemorrhoids. ?  ?prednisoLONE acetate 1 % ophthalmic suspension ?Commonly known as: PRED FORTE ?Place 1 drop into the left eye every 4 (four) hours. ?  ?Preparation H 0.25-88.44 % suppository ?Generic drug: shark liver oil-cocoa butter ?Place rectally. ?  ?tamsulosin 0.4 MG Caps capsule ?Commonly known as: FLOMAX ?Take 0.4 mg by mouth daily. ?  ?witch hazel-glycerin pad ?Commonly known as: TUCKS ?Apply topically as needed for itching. ?  ? ?  ? ?  ?  ? ? ?  ?Durable  Medical Equipment  ?(From admission, onward)  ?  ? ? ?  ? ?  Start     Ordered  ? 06/25/21 1543  For home use only DME Hospital bed  Once       ?Question Answer Comment  ?Length of Need 12 Months   ?Patient has (list medical condition): ESRD, generalized weakness   ?Bed type Semi-electric   ?  ? 06/25/21 1543  ? 06/25/21 1542  For home use only DME standard manual wheelchair with seat cushion  Once       ?Comments: Patient suffers from ESRD which impairs their ability to perform daily activities like bathing, dressing, feeding, grooming, and toileting in the home.  A cane, crutch, or walker will not resolve issue with performing activities of daily living. A wheelchair will allow patient to safely perform daily activities. Patient can safely propel the wheelchair in the home or has a caregiver who can provide assistance. Length of need Lifetime. ?Accessories: elevating leg rests (ELRs), wheel locks, extensions and anti-tippers.  ? 06/25/21 1542  ? ?  ?  ? ?   ? ? ?Allergies  ?Allergen Reactions  ? Levofloxacin Swelling  ?  Facial swelling  ? Promethazine Diarrhea and Other (See Comments)  ?  Muscle cramps  ? Malt Other (See Comments)  ?  Cramping  ? ? ?Consultations: ?Neuro ?N

## 2021-06-27 NOTE — TOC Transition Note (Signed)
Transition of Care (TOC) - CM/SW Discharge Note ? ? ?Patient Details  ?Name: [name]. ?MRN: [id number] ?Date of Birth: 07-13-1970 ? ?Transition of Care (TOC) CM/SW Contact:  ?Izola Price, RN ?Phone Number: ?06/27/2021, 2:49 PM ? ? ?Clinical Narrative: 5/6: Patient being discharged today. HH was set up by [facility] per prior CM. DME Hospital Bed and Wheelchair ordered via [place]. Per [place], nothing in her list though it had been ordered on 5/4 per [employer]. Jasmine indicated it could be delivered by  5 pm today. Spouse's number given to [facility] for home set up. [place] notified of DC via [place]. Simmie Davies RN CM  ? ? ? ?Final next level of care: [place] ?Barriers to Discharge: Continued Medical Work up ? ? ?Patient Goals and CMS Choice ?Patient states their goals for this hospitalization and ongoing recovery are:: to go home ?CMS Medicare.gov Compare Post Acute Care list provided to:: Patient ?Choice offered to / list presented to : Patient ? ?Discharge Placement ?  ?           ?  ?  ?  ?  ? ?Discharge Plan and Services ?  ?  ?           ?DME Arranged: Hospital bed, Wheelchair manual ?DME Agency: [employer] ?Date DME Agency Contacted: 06/27/21 ?Time DME Agency Contacted: [phone] ?Representative spoke with at DME Agency: Delana Meyer (no account on file per [facility]) ?HH Arranged: OT, PT ?[place] Agency: [place] ?Date HH Agency Contacted: 06/25/21 ?Time [place]: 1517 ?Representative spoke with at [place]: Tommi Rumps ? ?Social Determinants of Health (SDOH) Interventions ?  ? ? ?Readmission Risk Interventions ? ?  11/27/2020  ?  3:06 PM 02/28/2020  ?  1:31 PM  ?Readmission Risk Prevention Plan  ?Transportation Screening Complete Complete  ?PCP or Specialist Appt within 3-5 Days Complete Complete  ?[place] or Home Care Consult Complete Complete  ?Social Work Consult for [place] Planning/Counseling Complete Complete  ?Palliative Care Screening Not Applicable Not Applicable  ?Medication  Review Press photographer) Complete Complete  ? ? ? ? ? ?

## 2021-06-29 ENCOUNTER — Telehealth (HOSPITAL_BASED_OUTPATIENT_CLINIC_OR_DEPARTMENT_OTHER): Payer: Self-pay | Admitting: Family

## 2021-06-29 NOTE — Telephone Encounter (Signed)
Patient has not responded to our 06/03/21 letter requesting he call to reschedule the [place] Call ordered by Dr. Irish Lack in January 2023 ?

## 2021-07-08 ENCOUNTER — Encounter (INDEPENDENT_AMBULATORY_CARE_PROVIDER_SITE_OTHER): Payer: Self-pay | Admitting: Nurse Practitioner

## 2021-07-08 ENCOUNTER — Ambulatory Visit (INDEPENDENT_AMBULATORY_CARE_PROVIDER_SITE_OTHER): Payer: Medicare HMO | Admitting: Nurse Practitioner

## 2021-07-08 ENCOUNTER — Ambulatory Visit (INDEPENDENT_AMBULATORY_CARE_PROVIDER_SITE_OTHER): Payer: Medicare HMO

## 2021-07-08 VITALS — BP 189/89 | HR 73 | Resp 16 | Wt 193.0 lb

## 2021-07-08 DIAGNOSIS — E1165 Type 2 diabetes mellitus with hyperglycemia: Secondary | ICD-10-CM | POA: Insufficient documentation

## 2021-07-08 DIAGNOSIS — E1121 Type 2 diabetes mellitus with diabetic nephropathy: Secondary | ICD-10-CM | POA: Insufficient documentation

## 2021-07-08 DIAGNOSIS — N186 End stage renal disease: Secondary | ICD-10-CM

## 2021-07-08 DIAGNOSIS — Z23 Encounter for immunization: Secondary | ICD-10-CM | POA: Insufficient documentation

## 2021-07-08 DIAGNOSIS — E782 Mixed hyperlipidemia: Secondary | ICD-10-CM

## 2021-07-08 DIAGNOSIS — I10 Essential (primary) hypertension: Secondary | ICD-10-CM | POA: Diagnosis not present

## 2021-07-08 DIAGNOSIS — J302 Other seasonal allergic rhinitis: Secondary | ICD-10-CM | POA: Insufficient documentation

## 2021-07-08 DIAGNOSIS — Z794 Long term (current) use of insulin: Secondary | ICD-10-CM

## 2021-07-09 ENCOUNTER — Other Ambulatory Visit: Payer: Self-pay | Admitting: Physical Medicine & Rehabilitation

## 2021-07-09 ENCOUNTER — Telehealth (INDEPENDENT_AMBULATORY_CARE_PROVIDER_SITE_OTHER): Payer: Self-pay

## 2021-07-09 NOTE — Telephone Encounter (Signed)
Spoke with the patient and he is scheduled with Dr. Lucky Cowboy for a left brachial cephalic AVF on 80/22/33 at [facility]. Pre-op phone call on 07/22/21 between 8-1 pm. Pre-surgical instructions were discussed and will  be mailed.

## 2021-07-15 ENCOUNTER — Other Ambulatory Visit: Payer: Medicare HMO

## 2021-07-16 ENCOUNTER — Other Ambulatory Visit: Payer: Medicare HMO

## 2021-07-18 ENCOUNTER — Encounter (INDEPENDENT_AMBULATORY_CARE_PROVIDER_SITE_OTHER): Payer: Self-pay | Admitting: Nurse Practitioner

## 2021-07-18 NOTE — Progress Notes (Signed)
Subjective:    Patient ID: [name]., male    DOB: [birth year], 51 y.o.   MRN: [id number] Chief Complaint  Patient presents with   Follow-up    Ref [place] consult.ESRD h/o ligated rue hda     The patient is seen for evaluation of dialysis access.  The patient has a history of multiple failed accesses.  There have been accesses in the right arm which are nonfunctioning.    Current access is via a catheter which is functioning well.  There have not been multiple episodes of catheter infection.  The patient denies fever and chills while on dialysis.  No tenderness or drainage at the exit site.  No recent shortening of the patient's walking distance or new symptoms consistent with claudication.  No history of rest pain symptoms. No new ulcers or wounds of the lower extremities have occurred.  The patient denies amaurosis fugax or recent TIA symptoms. There are no recent neurological changes noted. There is no history of DVT, PE or superficial thrombophlebitis. No recent episodes of angina or shortness of breath documented.    Currently the patient has adequate access for a left radiocephalic AV fistula.  However the patient is adamant that he does not want a fistula in his forearm therefore the patient should have a brachiocephalic AV fistula placed.   Review of Systems  All other systems reviewed and are negative.     Objective:   Physical Exam Vitals reviewed.  HENT:     Head: Normocephalic.  Cardiovascular:     Rate and Rhythm: Normal rate.     Pulses:          Radial pulses are 1+ on the right side and 1+ on the left side.  Pulmonary:     Effort: Pulmonary effort is normal.  Neurological:     Mental Status: He is alert.  Psychiatric:        Mood and Affect: Mood normal.        Behavior: Behavior normal.        Thought Content: Thought content normal.        Judgment: Judgment normal.    BP (!) 189/89 (BP Location: Left Arm)   Pulse 73   Resp 16   Wt 193 lb  (87.5 kg)   BMI 24.12 kg/m   Past Medical History:  Diagnosis Date   Anxiety    Cellulitis    CHF (congestive heart failure) (HCC)    Chronic diarrhea    Chronic kidney disease    Chronic pain of both ankles    Diabetes mellitus without complication (HCC)    Diabetes mellitus, type II (HCC)    Diabetic neuropathy (HCC)    Esophagitis    Frequent falls    Gait instability    Gastritis    GERD (gastroesophageal reflux disease)    H/O enucleation of left eyeball    Heart palpitations    Hemodialysis patient ([place])    History of kidney stones    HLD (hyperlipidemia)    Hypertension    Insomnia    Nausea and vomiting in adult    recurrent    Social History   Socioeconomic History   Marital status: Married    Spouse name: [name]    Number of children: 2   Years of education: Not on file   Highest education level: High school graduate  Occupational History   Occupation: Disability    Comment: not employed  Tobacco Use  Smoking status: Never   Smokeless tobacco: Never  Vaping Use   Vaping Use: Never used  Substance and Sexual Activity   Alcohol use: No   Drug use: No   Sexual activity: Yes  Other Topics Concern   Not on file  Social History Narrative   Oldest son killed in car crash June 2020.    Social Determinants of Health   Financial Resource Strain: Not on file  Food Insecurity: Not on file  Transportation Needs: Not on file  Physical Activity: Not on file  Stress: Not on file  Social Connections: Not on file  Intimate Partner Violence: Not on file    Past Surgical History:  Procedure Laterality Date   AMPUTATION Left 03/16/2016   Procedure: [place];  Surgeon: Trula Slade, DPM;  Location: [place];  Service: Podiatry;  Laterality: Left;  can start around 5    AMPUTATION Right 02/09/2021   Procedure: AMPUTATION DIGIT;  Surgeon: Sherilyn Cooter, MD;  Location: [place];  Service: Orthopedics;  Laterality: Right;   ARTHROSCOPIC  REPAIR ACL Left    AV FISTULA PLACEMENT Right 05/31/2019   Procedure: Brachiocephalic AV fistula creation;  Surgeon: Algernon Huxley, MD;  Location: [name] ORS;  Service: Vascular;  Laterality: Right;   COLONOSCOPY WITH PROPOFOL N/A 10/28/2015   Procedure: COLONOSCOPY WITH PROPOFOL;  Surgeon: Lollie Sails, MD;  Location: [facility] ENDOSCOPY;  Service: Endoscopy;  Laterality: N/A;   COLONOSCOPY WITH PROPOFOL N/A 10/29/2015   Procedure: COLONOSCOPY WITH PROPOFOL;  Surgeon: Lollie Sails, MD;  Location: [facility] ENDOSCOPY;  Service: Endoscopy;  Laterality: N/A;   CORONARY/GRAFT ACUTE MI REVASCULARIZATION N/A 02/26/2020   Procedure: Coronary/Graft Acute MI Revascularization;  Surgeon: Jettie Booze, MD;  Location: [place] CV LAB;  Service: Cardiovascular;  Laterality: N/A;   DIALYSIS/PERMA CATHETER INSERTION Right 04/26/2019   Perm Cath    DIALYSIS/PERMA CATHETER INSERTION N/A 04/26/2019   Procedure: DIALYSIS/PERMA CATHETER INSERTION;  Surgeon: Algernon Huxley, MD;  Location: [place] CV LAB;  Service: Cardiovascular;  Laterality: N/A;   DIALYSIS/PERMA CATHETER INSERTION N/A 05/25/2021   Procedure: DIALYSIS/PERMA CATHETER INSERTION;  Surgeon: Algernon Huxley, MD;  Location: [place] [facility] LAB;  Service: Cardiovascular;  Laterality: N/A;   DIALYSIS/PERMA CATHETER REMOVAL N/A 08/15/2019   Procedure: DIALYSIS/PERMA CATHETER REMOVAL;  Surgeon: Katha Cabal, MD;  Location: [place] [facility] LAB;  Service: Cardiovascular;  Laterality: N/A;   EMBOLIZATION Right 02/06/2021   Procedure: EMBOLIZATION;  Surgeon: Algernon Huxley, MD;  Location: [place] CV LAB;  Service: Cardiovascular;  Laterality: Right;  Right Upper Extremity Dialysis Access, Permcath Placement   ESOPHAGOGASTRODUODENOSCOPY (EGD) WITH PROPOFOL N/A 12/27/2017   Procedure: ESOPHAGOGASTRODUODENOSCOPY (EGD) WITH PROPOFOL;  Surgeon: [place], Benay Pike, MD;  Location: [name] ENDOSCOPY;  Service: Gastroenterology;  Laterality: N/A;   INTRAVASCULAR  ULTRASOUND/IVUS N/A 02/26/2020   Procedure: Intravascular Ultrasound/IVUS;  Surgeon: Jettie Booze, MD;  Location: [place] [facility] LAB;  Service: Cardiovascular;  Laterality: N/A;   LEFT HEART CATH AND CORONARY ANGIOGRAPHY N/A 02/26/2020   Procedure: LEFT HEART CATH AND CORONARY ANGIOGRAPHY;  Surgeon: Jettie Booze, MD;  Location: [place] CV LAB;  Service: Cardiovascular;  Laterality: N/A;   PROSTATE SURGERY  2016   TONSILECTOMY/ADENOIDECTOMY WITH MYRINGOTOMY     TONSILLECTOMY     UPPER EXTREMITY ANGIOGRAPHY Right 11/26/2020   Procedure: Upper Extremity Angiography;  Surgeon: Algernon Huxley, MD;  Location: [place] [facility] LAB;  Service: Cardiovascular;  Laterality: Right;   UPPER EXTREMITY  ANGIOGRAPHY Right 02/05/2021   Procedure: UPPER EXTREMITY ANGIOGRAPHY;  Surgeon: Algernon Huxley, MD;  Location: [place] [facility] LAB;  Service: Cardiovascular;  Laterality: Right;    Family History  Problem Relation Age of Onset   CAD Father    Stroke Father    Diabetes Mellitus II Mother    Kidney failure Mother    Schizophrenia Mother     Allergies  Allergen Reactions   Levofloxacin Swelling    Facial swelling Other reaction(s): swelling   Promethazine Diarrhea and Other (See Comments)    Muscle cramps   Promethazine Hcl Other (See Comments)    Cramping all over  Other reaction(s): cramp all over   Other     Other reaction(s): Unknown Other reaction(s): Unknown   Malt Other (See Comments)    Cramping       Latest Ref Rng & Units 06/27/2021    5:32 AM 06/26/2021    6:21 AM 06/25/2021    4:11 AM  CBC  WBC 4.0 - 10.5 K/uL 7.6   8.0   8.1    Hemoglobin 13.0 - 17.0 g/dL 11.0   11.1   11.6    Hematocrit 39.0 - 52.0 % 36.9   37.2   38.7    Platelets 150 - 400 K/uL 134   136   150        CMP     Component Value Date/Time   NA 134 (L) 06/27/2021 0532   K 4.3 06/27/2021 0532   CL 98 06/27/2021 0532   CO2 23 06/27/2021 0532   GLUCOSE 72 06/27/2021 0532   BUN 54 (H) 06/27/2021  0532   CREATININE 9.95 (H) 06/27/2021 0532   CALCIUM 8.4 (L) 06/27/2021 0532   PROT 7.0 06/23/2021 2238   ALBUMIN 3.5 06/23/2021 2238   AST 15 06/23/2021 2238   ALT 9 06/23/2021 2238   ALKPHOS 84 06/23/2021 2238   BILITOT 1.1 06/23/2021 2238   GFRNONAA 6 (L) 06/27/2021 0532   GFRAA 5 (L) 08/27/2019 1540     No results found.     Assessment & Plan:   1. ESRD (end stage renal disease) ([place]) Recommend:  At this time the patient does not have appropriate extremity access for dialysis  Patient should have a left brachiocephalic AV fistula created.  The patient does have adequate diameters for radiocephalic AV fistula however he does not wish to have one in his forearm.  The benefit of starting from the forearm moving more proximal if more access is needed was explained to the patient.  Even with vaccination, the patient did not want any access in his forearm  The risks, benefits and alternative therapies were reviewed in detail with the patient.  All questions were answered.  The patient agrees to proceed with surgery.   The patient will follow up with me in the office after the surgery.   2. Uncontrolled type 2 diabetes mellitus with hyperglycemia, with long-term current use of insulin (HCC) Continue hypoglycemic medications as already ordered, these medications have been reviewed and there are no changes at this time.  Hgb A1C to be monitored as already arranged by primary service   3. Essential hypertension, benign Continue antihypertensive medications as already ordered, these medications have been reviewed and there are no changes at this time.   4. Mixed hyperlipidemia Continue statin as ordered and reviewed, no changes at this time    Current Outpatient Medications on File Prior to Visit  Medication Sig Dispense Refill  acetaminophen (TYLENOL) 325 MG tablet Take 1-2 tablets (325-650 mg total) by mouth every 4 (four) hours as needed for mild pain.     amiodarone  (PACERONE) 200 MG tablet TAKE 1 TABLET BY MOUTH ONCE DAILY. 30 tablet 0   amLODipine (NORVASC) 5 MG tablet Take 5 mg by mouth daily.     atorvastatin (LIPITOR) 80 MG tablet Take 1 tablet (80 mg total) by mouth daily. 30 tablet 0   calcium acetate (PHOSLO) 667 MG capsule Take 2 capsules (1,334 mg total) by mouth 3 (three) times daily with meals. 180 capsule 0   carvedilol (COREG) 25 MG tablet Take 25 mg by mouth 2 (two) times daily.     clonazePAM (KLONOPIN) 0.5 MG disintegrating tablet Take 1 tablet (0.5 mg total) by mouth 2 (two) times daily. 60 tablet 0   cloNIDine (CATAPRES) 0.1 MG tablet Take 1 tablet (0.1 mg total) by mouth 3 (three) times daily as needed (systolic blood pressure (top number) is Greater than 200 or DBP (lower number)is Greater than 110). 30 tablet 0   clopidogrel (PLAVIX) 75 MG tablet Take 1 tablet (75 mg total) by mouth daily. 30 tablet 0   cyclobenzaprine (FLEXERIL) 10 MG tablet Take 10 mg by mouth 3 (three) times daily.     dorzolamide-timolol (COSOPT) 22.3-6.8 MG/ML ophthalmic solution Place 1 drop into the right eye 2 (two) times daily.     ELIQUIS 2.5 MG TABS tablet Take 2.5 mg by mouth 2 (two) times daily.     erythromycin ophthalmic ointment SMARTSIG:In Eye(s)     irbesartan (AVAPRO) 150 MG tablet Take 1 tablet (150 mg total) by mouth daily. 30 tablet 0   lactobacillus (FLORANEX/LACTINEX) PACK Take 1 packet (1 g total) by mouth 3 (three) times daily with meals. 90 packet 0   melatonin 5 MG TABS Take 1 tablet (5 mg total) by mouth at bedtime as needed. 30 tablet 0   neomycin-polymyxin b-dexamethasone (MAXITROL) 3.5-10000-0.1 OINT Place 1 application. into the left eye in the morning and at bedtime.     nitroGLYCERIN (NITROSTAT) 0.4 MG SL tablet Place 1 tablet (0.4 mg total) under the tongue every 5 (five) minutes as needed for chest pain.     phenylephrine (,USE FOR PREPARATION-H,) 0.25 % suppository Place 1 suppository rectally 2 (two) times daily as needed for  hemorrhoids. 12 suppository 0   prednisoLONE acetate (PRED FORTE) 1 % ophthalmic suspension Place 1 drop into the left eye every 4 (four) hours. 5 mL 0   PREPARATION H 0.25-88.44 % suppository Place rectally.     tamsulosin (FLOMAX) 0.4 MG CAPS capsule Take 0.4 mg by mouth daily.     witch hazel-glycerin (TUCKS) pad Apply topically as needed for itching. 40 each 12   No current facility-administered medications on file prior to visit.    There are no Patient Instructions on file for this visit. No follow-ups on file.   Kris Hartmann, NP

## 2021-07-22 ENCOUNTER — Inpatient Hospital Stay: Admission: RE | Admit: 2021-07-22 | Payer: Medicare HMO | Source: Ambulatory Visit

## 2021-07-22 ENCOUNTER — Encounter (HOSPITAL_COMMUNITY): Payer: Self-pay | Admitting: Urgent Care

## 2021-07-22 ENCOUNTER — Encounter
Admission: RE | Admit: 2021-07-22 | Discharge: 2021-07-22 | Disposition: A | Discharge status: Home / Self Care | Payer: Medicare HMO | Source: Ambulatory Visit | Attending: Vascular Surgery | Admitting: Vascular Surgery

## 2021-07-22 ENCOUNTER — Other Ambulatory Visit (INDEPENDENT_AMBULATORY_CARE_PROVIDER_SITE_OTHER): Payer: Self-pay | Admitting: Nurse Practitioner

## 2021-07-22 ENCOUNTER — Telehealth: Payer: Self-pay | Admitting: *Deleted

## 2021-07-22 VITALS — Ht 75.0 in | Wt 193.0 lb

## 2021-07-22 DIAGNOSIS — N186 End stage renal disease: Secondary | ICD-10-CM

## 2021-07-22 HISTORY — DX: Atherosclerotic heart disease of native coronary artery without angina pectoris: I25.10

## 2021-07-22 HISTORY — DX: Cardiac arrhythmia, unspecified: I49.9

## 2021-07-22 HISTORY — DX: Acute myocardial infarction, unspecified: I21.9

## 2021-07-22 HISTORY — DX: Cerebral infarction, unspecified: I63.9

## 2021-07-22 NOTE — Patient Instructions (Addendum)
Your procedure is scheduled [id number] July 30, 2021. Report to Day Surgery inside [facility] 2nd floor, stop by admissions desk before getting on elevator.  To find out your arrival time please call [phone] between 1PM - 3PM on Wednesday July 29, 2021.  Remember: Instructions that are not followed completely may result in serious medical risk,  up to and including death, or upon the discretion of your surgeon and anesthesiologist your  surgery may need to be rescheduled.     _X__ 1. Do not eat food or drink fluids after midnight the night before your procedure.                 No chewing gum or hard candies.   __X__2.  On the morning of surgery brush your teeth with toothpaste and water, you                may rinse your mouth with mouthwash if you wish.  Do not swallow any toothpaste or mouthwash.     _X__ 3.  No Alcohol for 24 hours before or after surgery.   _X__ 4.  Do Not Smoke or use e-cigarettes For 24 Hours Prior to Your Surgery.                 Do not use any chewable tobacco products for at least 6 hours prior to                 Surgery.  _X__  5.  Do not use any recreational drugs (marijuana, cocaine, heroin, ecstasy, MDMA or other)                For at least one week prior to your surgery.  Combination of these drugs with anesthesia                May have life threatening results.  ____  6.  Bring all medications with you on the day of surgery if instructed.   __X__  7.  Notify your doctor if there is any change in your medical condition      (cold, fever, infections).     Do not wear jewelry, make-up, hairpins, clips or nail polish. Do not wear lotions, powders, or perfumes. You may wear deodorant. Do not shave 48 hours prior to surgery. Men may shave face and neck. Do not bring valuables to the hospital.    [facility] is not responsible for any belongings or valuables.  Contacts, dentures or bridgework may not be worn into  surgery. Leave your suitcase in the car. After surgery it may be brought to your room. For patients admitted to the hospital, discharge time is determined by your treatment team.   Patients discharged the day of surgery will not be allowed to drive home.   Make arrangements for someone to be with you for the first 24 hours of your Same Day Discharge.   __X__ Take these medicines the morning of surgery with A SIP OF WATER:    1. amiodarone (PACERONE) 200 MG   2. amLODipine (NORVASC) 5 MG   3. carvedilol (COREG) 25 MG  4. tamsulosin (FLOMAX) 0.4 MG CAPS  5.  6.  ____ Fleet Enema (as directed)   __X__ Use CHG Soap (or wipes) as directed  ____ Use Benzoyl Peroxide Gel as instructed  ____ Use inhalers on the day of surgery  ____ Stop metformin 2 days prior to surgery    ____ Take 1/2 of usual insulin  dose the night before surgery. No insulin the morning          of surgery.   __X__ Stop clopidogrel (PLAVIX) 75 MG 5 days before your surgery last day 07/24/21 and stop ELIQUIS 2.5 MG TABS 2 days prior to surgery Monday 07/27/21.  __X__ One Week prior to surgery- Stop Anti-inflammatories such as Ibuprofen, Aleve, Advil, Motrin, meloxicam (MOBIC), diclofenac, etodolac, ketorolac, Toradol, Daypro, piroxicam, Goody's or BC powders. OK TO USE TYLENOL IF NEEDED   __X__  Do not start any new vitamins and or supplements supplements until after surgery. melatonin 5 MG    ____ Bring C-Pap to the hospital.    If you have any questions regarding your pre-procedure instructions,  Please call Pre-admit Testing at [phone]

## 2021-07-22 NOTE — Telephone Encounter (Signed)
MD to review this complicated case.  Patient has a history of hypertension, hyperlipidemia, DM2, gastroparesis, ESRD on HD, chronic diastolic heart failure, CAD and recently diagnosed CVA.  Patient had prior history of anterior STEMI in January 2022 with DES to LAD.  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  Patient was previously seen by Laurann Montana NP on 04/20/2021 for preoperative clearance prior to total parathyroidectomy with reimplantation on the gland exploration.  He was okayed by Dr. Irish Lack to hold Plavix for 5 days prior to the procedure.  During the office visit, he was noted to have new inferior changes on the EKG.  His RCRI perioperative risk was high at 11%.  Therefore a Myoview was ordered to risk stratify the patient.  Unfortunately this was never completed.  Since the last visit, patient has been admitted to the hospital in March 2023 with recurrent CVA.  He was resumed on Eliquis and Plavix.  He has upcoming AV fistula by Dr. Lucky Cowboy of vascular surgery.  He will be a potentially high risk patient undergoing a high risk surgery.  Given his previous stroke, I would recommend office visit, unfortunately the earliest office visit he can present to is on 6/5 which is 3 days prior to the surgery.  In this case, would you recommend urgent nuclear stress test to risk stratify this patient?  Should really begin to hold his Plavix prior to the office visit?  Since Plavix will need to be held for 5 days, surgery is on 6/8, therefore will need to start holding Plavix this weekend.  Please forward your recommendation to P CV DIV PREOP

## 2021-07-22 NOTE — Telephone Encounter (Signed)
Pt agreeable to plan of care for in office appt 07/27/21 @ 1:55 with Christen Bame, NP for pre op clearance. Pt asked what address. I gave [address]. Pt thanked me for the call and the help. I will forward notes to NP for appt. Will send FYI to requesting office the pt has appt 07/27/21.

## 2021-07-22 NOTE — Telephone Encounter (Signed)
Clinical pharmacist to review 

## 2021-07-22 NOTE — Telephone Encounter (Signed)
Request for pre-operative cardiac clearance Received: Today [name] Kitchens, NP  P Cv Div Preop Callback Request for pre-operative cardiac clearance:   **PLEASE BE SURE PROVIDER SEES #8 ON THIS LIST**   1. What type of surgery is being performed?  ARTERIOVENOUS (AV) FISTULA CREATION (BRACHIAL CEPHALIC)   2. When is this surgery scheduled?  07/30/2021     3. Type of clearance being requested (medical, pharmacy, both).  BOTH     4. Are there any medications that need to be held prior to surgery?  APIXABAN and CLOPIDOGREL   5. Practice name and name of physician performing surgery?  Performing surgeon: Dr. [name], MD  Requesting clearance: Honor Loh, FNP-C       6. Anesthesia type (none, local, MAC, general)?  GENERAL   7. What is the office phone and fax number?    Phone: [phone]  Fax: [phone]   8. Special notes: Patient seen in pre-op consult at [facility] today prior to a proposed eye surgery.  Please see the note from ophthalmology:   "After reviewing with the pre-anesthesia care team, it was felt that it would be in [name]'s medical best interest to avoid general anesthesia and instead pursue his upcoming orbital implant removal under monitored anesthesia care with a regional block. As such, I will only be able to remove the exposed implant and will not pursue a dermis fat graft, as general anesthesia is necessary for dermis fat grafting to the orbit, but is felt to be unduly risky in the setting of his known significant cardiovascular disease. I called the patient this afternoon to discuss these points-he did not answer. I left a voicemail asking for call back. - Electronically signed by Marsh Dolly, MD at 07/22/2021 1:31 PM EDT"   ATTENTION: Unable to create telephone message as per your standard workflow. Directed by [facility] providers to send requests for cardiac clearance to this pool for appropriate distribution to provider covering pre-operative  clearances.   Honor Loh, MSN, APRN, FNP-C, CEN  [facility]  Peri-operative Services Nurse Practitioner  Phone: [phone]  07/22/21 2:42 PM

## 2021-07-22 NOTE — Telephone Encounter (Signed)
   Name: [name].  DOB: 1970-08-27  MRN: [id number]  Primary Cardiologist: Larae Grooms, MD  Chart reviewed as part of pre-operative protocol coverage. Because of [name] Wardrop Jr.'s past medical history and time since last visit, he will require a follow-up in-office visit in order to better assess preoperative cardiovascular risk.  Pre-op covering staff: - Please schedule appointment and call patient to inform them. If patient already had an upcoming appointment within acceptable timeframe, please add "pre-op clearance" to the appointment notes so provider is aware. - Please contact requesting surgeon's office via preferred method (i.e, phone, fax) to inform them of need for appointment prior to surgery.  Please see recommendations for holding Eliquis and Plavix and February office note with Laurann Montana, NP  Mable Fill, Marissa Nestle, NP  07/22/2021, 4:03 PM

## 2021-07-23 NOTE — Telephone Encounter (Signed)
    Okay to proceed with surgery without stress testing as he is asymptomatic from a cardiac standpoint.  Surgery will be done with the patient on clopidogrel.  Discussed with Almyra Deforest.  Jettie Booze, MD

## 2021-07-23 NOTE — Telephone Encounter (Signed)
Patient with diagnosis of afib on Eliquis for anticoagulation.    Procedure: ARTERIOVENOUS (AV) FISTULA CREATION (BRACHIAL CEPHALIC)   Date of procedure: 07/30/21  Of note patient had a CVA in Dec 2022 in the setting of holding [place] for amputation of right upper extremity finger. He had another stroke 04/13/21.    CHA2DS2-VASc Score = 6   This indicates a 9.7% annual risk of stroke. The patient's score is based upon: CHF History: 1 HTN History: 1 Diabetes History: 1 Stroke History: 2 Vascular Disease History: 1 Age Score: 0 Gender Score: 0      Due to patient being a ESRD pt and hx of multiple CVA most recent being in Feb 2023, I would recommend admission to hospital prior to procedure for heparin drip. Patient may hold Eliquis for 3 days prior. He should be admitted and heparin drip started 12 hours after his last Eliquis dose.  I will send to Dr. Irish Lack to see if he agrees

## 2021-07-23 NOTE — Telephone Encounter (Signed)
Dr. Lucky Cowboy only needed the Eliquis to be held the day of the procedure and resume the next day. This short of duration holding time likely does not need IV heparin or admission.

## 2021-07-23 NOTE — Telephone Encounter (Signed)
Discussed the case with Dr. Irish Lack who felt the patient is okay to proceed with vascular surgery without stress test as long as he is asymptomatic. Dr. Lucky Cowboy can do the surgery on plavix, only remaining question is how long to hold Eliquis.   I called and spoke with the patient, he denies any recent chest pain or any residual deficit from this recent stroke.   I will keep him on the schedule for office visit on Monday, however at this time, Dr. Irish Lack does not recommend stress testing unless he becomes symptomatic.

## 2021-07-23 NOTE — Telephone Encounter (Signed)
I will forward to pre op provider notes from surgeon.

## 2021-07-23 NOTE — Telephone Encounter (Signed)
Per Dr. Lucky Cowboy, he can do the surgery on Plavix.  Awaiting clinical pharmacist recommendation regarding Eliquis.  We will still need MD to comment whether or not the patient will need urgent nuclear stress test prior to final clearance.

## 2021-07-23 NOTE — Telephone Encounter (Signed)
Per Dr. 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 Upcoming cardiology appt can be canceled.

## 2021-07-26 ENCOUNTER — Emergency Department (HOSPITAL_COMMUNITY): Payer: Medicare HMO

## 2021-07-26 ENCOUNTER — Inpatient Hospital Stay (HOSPITAL_COMMUNITY)
Admission: EM | Admit: 2021-07-26 | Discharge: 2021-07-30 | DRG: 100 | Disposition: A | Discharge status: Home / Self Care | Payer: Medicare HMO | Attending: Family Medicine | Admitting: Family Medicine

## 2021-07-26 ENCOUNTER — Other Ambulatory Visit: Payer: Self-pay

## 2021-07-26 ENCOUNTER — Encounter (HOSPITAL_COMMUNITY): Payer: Self-pay | Admitting: Emergency Medicine

## 2021-07-26 DIAGNOSIS — E1165 Type 2 diabetes mellitus with hyperglycemia: Secondary | ICD-10-CM

## 2021-07-26 DIAGNOSIS — M25572 Pain in left ankle and joints of left foot: Secondary | ICD-10-CM | POA: Diagnosis present

## 2021-07-26 DIAGNOSIS — Z888 Allergy status to other drugs, medicaments and biological substances status: Secondary | ICD-10-CM

## 2021-07-26 DIAGNOSIS — G8929 Other chronic pain: Secondary | ICD-10-CM | POA: Diagnosis present

## 2021-07-26 DIAGNOSIS — M25571 Pain in right ankle and joints of right foot: Secondary | ICD-10-CM | POA: Diagnosis present

## 2021-07-26 DIAGNOSIS — Z8249 Family history of ischemic heart disease and other diseases of the circulatory system: Secondary | ICD-10-CM

## 2021-07-26 DIAGNOSIS — H4050X0 Glaucoma secondary to other eye disorders, unspecified eye, stage unspecified: Secondary | ICD-10-CM | POA: Diagnosis present

## 2021-07-26 DIAGNOSIS — F419 Anxiety disorder, unspecified: Secondary | ICD-10-CM | POA: Diagnosis present

## 2021-07-26 DIAGNOSIS — I469 Cardiac arrest, cause unspecified: Secondary | ICD-10-CM

## 2021-07-26 DIAGNOSIS — N186 End stage renal disease: Secondary | ICD-10-CM | POA: Diagnosis present

## 2021-07-26 DIAGNOSIS — J9601 Acute respiratory failure with hypoxia: Secondary | ICD-10-CM | POA: Diagnosis present

## 2021-07-26 DIAGNOSIS — Z87442 Personal history of urinary calculi: Secondary | ICD-10-CM

## 2021-07-26 DIAGNOSIS — K3184 Gastroparesis: Secondary | ICD-10-CM | POA: Diagnosis present

## 2021-07-26 DIAGNOSIS — I4821 Permanent atrial fibrillation: Secondary | ICD-10-CM | POA: Diagnosis present

## 2021-07-26 DIAGNOSIS — I132 Hypertensive heart and chronic kidney disease with heart failure and with stage 5 chronic kidney disease, or end stage renal disease: Secondary | ICD-10-CM | POA: Diagnosis present

## 2021-07-26 DIAGNOSIS — Z992 Dependence on renal dialysis: Secondary | ICD-10-CM | POA: Diagnosis not present

## 2021-07-26 DIAGNOSIS — Z91018 Allergy to other foods: Secondary | ICD-10-CM

## 2021-07-26 DIAGNOSIS — E1143 Type 2 diabetes mellitus with diabetic autonomic (poly)neuropathy: Secondary | ICD-10-CM | POA: Diagnosis present

## 2021-07-26 DIAGNOSIS — R569 Unspecified convulsions: Principal | ICD-10-CM

## 2021-07-26 DIAGNOSIS — I252 Old myocardial infarction: Secondary | ICD-10-CM

## 2021-07-26 DIAGNOSIS — E1151 Type 2 diabetes mellitus with diabetic peripheral angiopathy without gangrene: Secondary | ICD-10-CM | POA: Diagnosis present

## 2021-07-26 DIAGNOSIS — I10 Essential (primary) hypertension: Secondary | ICD-10-CM | POA: Diagnosis not present

## 2021-07-26 DIAGNOSIS — G253 Myoclonus: Secondary | ICD-10-CM | POA: Diagnosis present

## 2021-07-26 DIAGNOSIS — Z8673 Personal history of transient ischemic attack (TIA), and cerebral infarction without residual deficits: Secondary | ICD-10-CM

## 2021-07-26 DIAGNOSIS — E875 Hyperkalemia: Secondary | ICD-10-CM | POA: Diagnosis present

## 2021-07-26 DIAGNOSIS — I5032 Chronic diastolic (congestive) heart failure: Secondary | ICD-10-CM | POA: Diagnosis present

## 2021-07-26 DIAGNOSIS — K219 Gastro-esophageal reflux disease without esophagitis: Secondary | ICD-10-CM | POA: Diagnosis present

## 2021-07-26 DIAGNOSIS — E1122 Type 2 diabetes mellitus with diabetic chronic kidney disease: Secondary | ICD-10-CM | POA: Diagnosis present

## 2021-07-26 DIAGNOSIS — E11649 Type 2 diabetes mellitus with hypoglycemia without coma: Secondary | ICD-10-CM | POA: Diagnosis not present

## 2021-07-26 DIAGNOSIS — Z841 Family history of disorders of kidney and ureter: Secondary | ICD-10-CM

## 2021-07-26 DIAGNOSIS — E785 Hyperlipidemia, unspecified: Secondary | ICD-10-CM | POA: Diagnosis present

## 2021-07-26 DIAGNOSIS — Z9181 History of falling: Secondary | ICD-10-CM

## 2021-07-26 DIAGNOSIS — Z7901 Long term (current) use of anticoagulants: Secondary | ICD-10-CM

## 2021-07-26 DIAGNOSIS — H4052X4 Glaucoma secondary to other eye disorders, left eye, indeterminate stage: Secondary | ICD-10-CM

## 2021-07-26 DIAGNOSIS — F339 Major depressive disorder, recurrent, unspecified: Secondary | ICD-10-CM | POA: Diagnosis present

## 2021-07-26 DIAGNOSIS — G40909 Epilepsy, unspecified, not intractable, without status epilepticus: Principal | ICD-10-CM | POA: Diagnosis present

## 2021-07-26 DIAGNOSIS — N401 Enlarged prostate with lower urinary tract symptoms: Secondary | ICD-10-CM | POA: Diagnosis present

## 2021-07-26 DIAGNOSIS — Z823 Family history of stroke: Secondary | ICD-10-CM

## 2021-07-26 DIAGNOSIS — Z91158 Patient's noncompliance with renal dialysis for other reason: Secondary | ICD-10-CM

## 2021-07-26 DIAGNOSIS — I480 Paroxysmal atrial fibrillation: Secondary | ICD-10-CM | POA: Diagnosis present

## 2021-07-26 DIAGNOSIS — K529 Noninfective gastroenteritis and colitis, unspecified: Secondary | ICD-10-CM | POA: Diagnosis present

## 2021-07-26 DIAGNOSIS — D631 Anemia in chronic kidney disease: Secondary | ICD-10-CM | POA: Diagnosis present

## 2021-07-26 DIAGNOSIS — E8889 Other specified metabolic disorders: Secondary | ICD-10-CM | POA: Diagnosis present

## 2021-07-26 DIAGNOSIS — Z7902 Long term (current) use of antithrombotics/antiplatelets: Secondary | ICD-10-CM

## 2021-07-26 DIAGNOSIS — I2510 Atherosclerotic heart disease of native coronary artery without angina pectoris: Secondary | ICD-10-CM | POA: Diagnosis present

## 2021-07-26 DIAGNOSIS — Z833 Family history of diabetes mellitus: Secondary | ICD-10-CM

## 2021-07-26 DIAGNOSIS — E119 Type 2 diabetes mellitus without complications: Secondary | ICD-10-CM

## 2021-07-26 DIAGNOSIS — Z9001 Acquired absence of eye: Secondary | ICD-10-CM

## 2021-07-26 DIAGNOSIS — E1169 Type 2 diabetes mellitus with other specified complication: Secondary | ICD-10-CM

## 2021-07-26 DIAGNOSIS — Z881 Allergy status to other antibiotic agents status: Secondary | ICD-10-CM

## 2021-07-26 DIAGNOSIS — Z79899 Other long term (current) drug therapy: Secondary | ICD-10-CM

## 2021-07-26 DIAGNOSIS — I214 Non-ST elevation (NSTEMI) myocardial infarction: Secondary | ICD-10-CM

## 2021-07-26 HISTORY — DX: Non-ST elevation (NSTEMI) myocardial infarction: I21.4

## 2021-07-26 HISTORY — DX: Cardiac arrest, cause unspecified: I46.9

## 2021-07-26 LAB — CBC WITH DIFFERENTIAL/PLATELET
Abs Immature Granulocytes: 0.02 10*3/uL (ref 0.00–0.07)
Basophils Absolute: 0.1 10*3/uL (ref 0.0–0.1)
Basophils Relative: 1 %
Eosinophils Absolute: 0.6 10*3/uL — ABNORMAL HIGH (ref 0.0–0.5)
Eosinophils Relative: 7 %
HCT: 33.7 % — ABNORMAL LOW (ref 39.0–52.0)
Hemoglobin: 10 g/dL — ABNORMAL LOW (ref 13.0–17.0)
Immature Granulocytes: 0 %
Lymphocytes Relative: 17 %
Lymphs Abs: 1.4 10*3/uL (ref 0.7–4.0)
MCH: 21.7 pg — ABNORMAL LOW (ref 26.0–34.0)
MCHC: 29.7 g/dL — ABNORMAL LOW (ref 30.0–36.0)
MCV: 73.1 fL — ABNORMAL LOW (ref 80.0–100.0)
Monocytes Absolute: 0.9 10*3/uL (ref 0.1–1.0)
Monocytes Relative: 10 %
Neutro Abs: 5.5 10*3/uL (ref 1.7–7.7)
Neutrophils Relative %: 65 %
Platelets: 153 10*3/uL (ref 150–400)
RBC: 4.61 MIL/uL (ref 4.22–5.81)
RDW: 22.5 % — ABNORMAL HIGH (ref 11.5–15.5)
WBC: 8.4 10*3/uL (ref 4.0–10.5)
nRBC: 0 % (ref 0.0–0.2)

## 2021-07-26 LAB — MAGNESIUM: Magnesium: 1.9 mg/dL (ref 1.7–2.4)

## 2021-07-26 LAB — COMPREHENSIVE METABOLIC PANEL
ALT: 18 U/L (ref 0–44)
AST: 12 U/L — ABNORMAL LOW (ref 15–41)
Albumin: 3.2 g/dL — ABNORMAL LOW (ref 3.5–5.0)
Alkaline Phosphatase: 98 U/L (ref 38–126)
Anion gap: 13 (ref 5–15)
BUN: 79 mg/dL — ABNORMAL HIGH (ref 6–20)
CO2: 18 mmol/L — ABNORMAL LOW (ref 22–32)
Calcium: 8.4 mg/dL — ABNORMAL LOW (ref 8.9–10.3)
Chloride: 108 mmol/L (ref 98–111)
Creatinine, Ser: 11.07 mg/dL — ABNORMAL HIGH (ref 0.61–1.24)
GFR, Estimated: 5 mL/min — ABNORMAL LOW (ref >60)
Glucose, Bld: 70 mg/dL (ref 70–99)
Potassium: 6.5 mmol/L (ref 3.5–5.1)
Sodium: 139 mmol/L (ref 135–145)
Total Bilirubin: 0.6 mg/dL (ref 0.3–1.2)
Total Protein: 6.5 g/dL (ref 6.5–8.1)

## 2021-07-26 LAB — ETHANOL: Alcohol, Ethyl (B): <10 mg/dL (ref <10)

## 2021-07-26 LAB — POTASSIUM
Potassium: 6.3 mmol/L (ref 3.5–5.1)
Potassium: 6 mmol/L — ABNORMAL HIGH (ref 3.5–5.1)
Potassium: 6 mmol/L — ABNORMAL HIGH (ref 3.5–5.1)

## 2021-07-26 LAB — CBG MONITORING, ED
Glucose-Capillary: 73 mg/dL (ref 70–99)
Glucose-Capillary: 72 mg/dL (ref 70–99)

## 2021-07-26 LAB — BRAIN NATRIURETIC PEPTIDE: B Natriuretic Peptide: 1785 pg/mL — ABNORMAL HIGH (ref 0.0–100.0)

## 2021-07-26 MED ORDER — LORAZEPAM 2 MG/ML IJ SOLN: 4.0000 mg | INTRAMUSCULAR | Status: DC | PRN

## 2021-07-26 MED ORDER — ATORVASTATIN CALCIUM 80 MG PO TABS
80.0000 mg | ORAL_TABLET | Freq: Every day | ORAL | Status: DC
  Administered 2021-07-27 – 2021-07-30 (×4): 80 mg via ORAL
  Filled 2021-07-26: qty 2
  Filled 2021-07-26 (×3): qty 1

## 2021-07-26 MED ORDER — ORAL CARE MOUTH RINSE
15.0000 mL | OROMUCOSAL | Status: DC
Start: 2021-07-27 — End: 2021-07-27
  Administered 2021-07-27: 15 mL via OROMUCOSAL

## 2021-07-26 MED ORDER — SODIUM BICARBONATE 8.4 % IV SOLN: 50.0000 meq | Freq: Once | INTRAVENOUS | Status: AC

## 2021-07-26 MED ORDER — CLONAZEPAM 0.25 MG PO TBDP
0.5000 mg | ORAL_TABLET | Freq: Two times a day (BID) | ORAL | Status: DC
  Administered 2021-07-27 – 2021-07-28 (×3): 0.5 mg via ORAL
  Filled 2021-07-26 (×3): qty 2

## 2021-07-26 MED ORDER — CALCIUM ACETATE (PHOS BINDER) 667 MG PO CAPS
1334.0000 mg | ORAL_CAPSULE | Freq: Three times a day (TID) | ORAL | Status: DC
  Administered 2021-07-27 – 2021-07-30 (×9): 1334 mg via ORAL
  Filled 2021-07-26 (×9): qty 2

## 2021-07-26 MED ORDER — DIVALPROEX SODIUM 250 MG PO DR TAB
500.0000 mg | DELAYED_RELEASE_TABLET | Freq: Three times a day (TID) | ORAL | Status: DC
Start: 2021-07-26 — End: 2021-07-28
  Administered 2021-07-27 – 2021-07-28 (×4): 500 mg via ORAL
  Filled 2021-07-26 (×4): qty 2

## 2021-07-26 MED ORDER — CYCLOBENZAPRINE HCL 10 MG PO TABS
10.0000 mg | ORAL_TABLET | Freq: Three times a day (TID) | ORAL | Status: DC
Start: 2021-07-26 — End: 2021-07-28
  Administered 2021-07-27 – 2021-07-28 (×4): 10 mg via ORAL
  Filled 2021-07-26 (×4): qty 1

## 2021-07-26 MED ORDER — SODIUM BICARBONATE 8.4 % IV SOLN
INTRAVENOUS | Status: AC
  Administered 2021-07-26: 50 meq via INTRAVENOUS
  Filled 2021-07-26: qty 50

## 2021-07-26 MED ORDER — HEPARIN SODIUM (PORCINE) 1000 UNIT/ML DIALYSIS
1000.0000 [IU] | INTRAMUSCULAR | Status: DC | PRN
  Filled 2021-07-26: qty 1

## 2021-07-26 MED ORDER — HEPARIN SODIUM (PORCINE) 1000 UNIT/ML DIALYSIS
2000.0000 [IU] | INTRAMUSCULAR | Status: DC | PRN
  Administered 2021-07-26 (×2): 2000 [IU] via INTRAVENOUS_CENTRAL

## 2021-07-26 MED ORDER — CLONIDINE HCL 0.1 MG PO TABS
0.1000 mg | ORAL_TABLET | Freq: Three times a day (TID) | ORAL | Status: DC | PRN
  Administered 2021-07-30: 0.1 mg via ORAL
  Filled 2021-07-26: qty 1

## 2021-07-26 MED ORDER — DORZOLAMIDE HCL-TIMOLOL MAL 2-0.5 % OP SOLN
1.0000 [drp] | Freq: Two times a day (BID) | OPHTHALMIC | Status: DC
Start: 2021-07-27 — End: 2021-07-31
  Administered 2021-07-27 – 2021-07-30 (×7): 1 [drp] via OPHTHALMIC
  Filled 2021-07-26: qty 10

## 2021-07-26 MED ORDER — CLOPIDOGREL BISULFATE 75 MG PO TABS
75.0000 mg | ORAL_TABLET | Freq: Every day | ORAL | Status: DC
  Administered 2021-07-27 – 2021-07-30 (×4): 75 mg via ORAL
  Filled 2021-07-26 (×4): qty 1

## 2021-07-26 MED ORDER — ACETAMINOPHEN 325 MG PO TABS
325.0000 mg | ORAL_TABLET | ORAL | Status: DC | PRN
  Administered 2021-07-28 – 2021-07-29 (×2): 650 mg via ORAL
  Filled 2021-07-26 (×3): qty 2

## 2021-07-26 MED ORDER — ERYTHROMYCIN 5 MG/GM OP OINT
TOPICAL_OINTMENT | Freq: Every day | OPHTHALMIC | Status: DC
  Filled 2021-07-26: qty 3.5

## 2021-07-26 MED ORDER — VALPROATE SODIUM 500 MG/5ML IV SOLN
INTRAVENOUS | Status: AC
  Filled 2021-07-26: qty 10

## 2021-07-26 MED ORDER — HYDRALAZINE HCL 20 MG/ML IJ SOLN
20.0000 mg | Freq: Once | INTRAMUSCULAR | Status: AC
Start: 2021-07-26 — End: 2021-07-26
  Administered 2021-07-26: 20 mg via INTRAVENOUS
  Filled 2021-07-26: qty 1

## 2021-07-26 MED ORDER — ALBUTEROL SULFATE (2.5 MG/3ML) 0.083% IN NEBU
10.0000 mg | INHALATION_SOLUTION | Freq: Once | RESPIRATORY_TRACT | Status: AC
  Administered 2021-07-26: 10 mg via RESPIRATORY_TRACT
  Filled 2021-07-26: qty 12

## 2021-07-26 MED ORDER — AMIODARONE HCL 200 MG PO TABS
200.0000 mg | ORAL_TABLET | Freq: Every day | ORAL | Status: DC
  Administered 2021-07-27 – 2021-07-30 (×4): 200 mg via ORAL
  Filled 2021-07-26 (×4): qty 1

## 2021-07-26 MED ORDER — DEXTROSE 50 % IV SOLN
25.0000 g | Freq: Once | INTRAVENOUS | Status: AC
  Administered 2021-07-26: 25 g via INTRAVENOUS
  Filled 2021-07-26: qty 50

## 2021-07-26 MED ORDER — SODIUM ZIRCONIUM CYCLOSILICATE 5 G PO PACK
10.0000 g | PACK | Freq: Once | ORAL | Status: AC
  Administered 2021-07-26: 10 g via ORAL
  Filled 2021-07-26: qty 2

## 2021-07-26 MED ORDER — TAMSULOSIN HCL 0.4 MG PO CAPS
0.4000 mg | ORAL_CAPSULE | Freq: Every day | ORAL | Status: DC
  Administered 2021-07-27 – 2021-07-30 (×4): 0.4 mg via ORAL
  Filled 2021-07-26 (×4): qty 1

## 2021-07-26 MED ORDER — CALCIUM GLUCONATE-NACL 1-0.675 GM/50ML-% IV SOLN
1.0000 g | Freq: Once | INTRAVENOUS | Status: AC
  Administered 2021-07-26: 1000 mg via INTRAVENOUS
  Filled 2021-07-26: qty 50

## 2021-07-26 MED ORDER — CARVEDILOL 12.5 MG PO TABS
25.0000 mg | ORAL_TABLET | Freq: Two times a day (BID) | ORAL | Status: DC
  Administered 2021-07-27 – 2021-07-30 (×7): 25 mg via ORAL
  Filled 2021-07-26 (×7): qty 2

## 2021-07-26 MED ORDER — NEOMYCIN-POLYMYXIN-DEXAMETH 3.5-10000-0.1 OP OINT
1.0000 | TOPICAL_OINTMENT | Freq: Every day | OPHTHALMIC | Status: DC
Start: 2021-07-27 — End: 2021-07-31
  Administered 2021-07-29 – 2021-07-30 (×2): 1 via OPHTHALMIC
  Filled 2021-07-26 (×2): qty 3.5

## 2021-07-26 MED ORDER — ALBUTEROL SULFATE (2.5 MG/3ML) 0.083% IN NEBU
INHALATION_SOLUTION | RESPIRATORY_TRACT | Status: AC
  Filled 2021-07-26: qty 12

## 2021-07-26 MED ORDER — SODIUM CHLORIDE 0.9% FLUSH
10.0000 mL | INTRAVENOUS | Status: DC | PRN
Start: 2021-07-26 — End: 2021-07-27

## 2021-07-26 MED ORDER — NITROGLYCERIN 0.4 MG SL SUBL
0.4000 mg | SUBLINGUAL_TABLET | SUBLINGUAL | Status: DC | PRN
Start: 2021-07-26 — End: 2021-07-31
  Administered 2021-07-28: 0.4 mg via SUBLINGUAL
  Filled 2021-07-26: qty 1

## 2021-07-26 MED ORDER — VALPROATE SODIUM 100 MG/ML IV SOLN
1880.0000 mg | Freq: Once | INTRAVENOUS | Status: AC
  Administered 2021-07-26: 1880 mg via INTRAVENOUS
  Filled 2021-07-26: qty 18.8

## 2021-07-26 MED ORDER — CLONIDINE HCL 0.1 MG PO TABS
0.1000 mg | ORAL_TABLET | Freq: Once | ORAL | Status: AC
  Administered 2021-07-26: 0.1 mg via ORAL
  Filled 2021-07-26: qty 1

## 2021-07-26 MED ORDER — AMLODIPINE BESYLATE 5 MG PO TABS
5.0000 mg | ORAL_TABLET | Freq: Once | ORAL | Status: AC
  Administered 2021-07-26: 5 mg via ORAL
  Filled 2021-07-26: qty 1

## 2021-07-26 MED ORDER — SODIUM CHLORIDE 0.9% FLUSH: 10.0000 mL | Freq: Two times a day (BID) | INTRAVENOUS | Status: DC

## 2021-07-26 MED ORDER — CHLORHEXIDINE GLUCONATE 0.12% ORAL RINSE (MEDLINE KIT): 15.0000 mL | Freq: Two times a day (BID) | OROMUCOSAL | Status: DC

## 2021-07-26 MED ORDER — ALTEPLASE 2 MG IJ SOLR: 2.0000 mg | Freq: Once | INTRAMUSCULAR | Status: AC | PRN

## 2021-07-26 MED ORDER — APIXABAN 2.5 MG PO TABS
2.5000 mg | ORAL_TABLET | Freq: Two times a day (BID) | ORAL | Status: DC
  Administered 2021-07-27 – 2021-07-30 (×7): 2.5 mg via ORAL
  Filled 2021-07-26 (×7): qty 1

## 2021-07-26 MED ORDER — INSULIN ASPART 100 UNIT/ML IJ SOLN: 0.0000 [IU] | Freq: Three times a day (TID) | INTRAMUSCULAR | Status: DC

## 2021-07-26 MED ORDER — CHLORHEXIDINE GLUCONATE CLOTH 2 % EX PADS
6.0000 | MEDICATED_PAD | Freq: Every day | CUTANEOUS | Status: DC
  Administered 2021-07-27 – 2021-07-30 (×4): 6 via TOPICAL

## 2021-07-26 MED ORDER — ALTEPLASE 2 MG IJ SOLR
INTRAMUSCULAR | Status: AC
  Administered 2021-07-26: 4 mg
  Filled 2021-07-26: qty 4

## 2021-07-26 MED ORDER — LORAZEPAM 2 MG/ML IJ SOLN
1.0000 mg | Freq: Once | INTRAMUSCULAR | Status: AC
  Administered 2021-07-26: 1 mg via INTRAVENOUS
  Filled 2021-07-26: qty 1

## 2021-07-26 NOTE — Procedures (Signed)
   EMERGENT HEMODIALYSIS TREATMENT NOTE:   HD performed in ED.  Pt oriented to self and place and gave verbal consent for treatment.  Left IJ TDC entry site is unremarkable.  Both ports aspirated and flushed without resistance.  Tolerated prescribed blood flow rate for 20 minutes before failing due to excessively negative arterial pressures.  No improvement with line reversal and repositioning.  Blood was returned and Activase was instilled x 60 minutes.  Tx was resumed with stable pressures at 350-400 BFR.  He requested Acetaminophen for generalized aches but had difficulty swallowing, stating "my throat hurts."  Pills were crushed and offered in apple sauce but he had trouble swallowing this as well.  Primary nurse notified attending provider.    Goal was lowered as BP declined (asymptomatic).  3.5 hour session completed.  Net UF 2.9 liters.  All blood was returned.  Hypertension corrected; otherwise, no changes from pre-HD assessment. Report given to Julaine Hua, RN.   Rockwell Alexandria, RN

## 2021-07-26 NOTE — Assessment & Plan Note (Signed)
Last A1C 04/21/21 4.7%. Patient has been taken off of medications  Plan Ss while in-patient

## 2021-07-26 NOTE — ED Notes (Signed)
Respiratory called for nebulizer treatment.

## 2021-07-26 NOTE — ED Notes (Signed)
AC Contacted for medication Depacon.

## 2021-07-26 NOTE — ED Notes (Signed)
Sodium Bicarb pulled for [employer].

## 2021-07-26 NOTE — ED Triage Notes (Signed)
Patient brought in via EMS from home. Patient has eyes closed. Answers yes and no questions by shaking head. Airway patent. Per paramedic called out for seizures. Witnessed seizure by family lasting approx 40 seconds in which he did fall. Patient alert but postictal upon their arrival became oriented and c/o back pain. C-collar placed, patient then had another full body seizure with jerking lasting approx 30 seconds in which paramedic started IV and administered 2.5mg  versed. No seizure activity noted since medication administered. Per paramedic family states that patient has had "a couple seizures in past" but does not take medication for seizures. Patient has gauze to left eye from recent eye surgery. No known fever per paramedic and blood sugar was 77 in route.

## 2021-07-26 NOTE — ED Provider Notes (Cosign Needed Addendum)
[facility] EMERGENCY DEPARTMENT Provider Note   CSN: [id number] Arrival date & time: 07/26/21  1051     History  Chief Complaint  Patient presents with   Seizures    [name]. is a 51 y.o. male.  HPI  Patient with medical history including multifocal myoclonus, CVA, diabetes, end-stage renal disease on dialysis Tuesday Thursday Saturday presents with complaints of a seizure.  Patient is unable to provide much information.  Wife is at bedside endorses that this morning patient had a seizure, she states that as he was getting ready for the day, she heard a bang and found the patient on the floor laying on his left side, with  full body jerking.  Full body jerking lasted approximately 30 seconds, afterwards he was out of it and seemed very tired.  Upon EMS arrival patient had another seizure and he was given Versed.  Wife states that patient has had seizures in the past but not on any medication,  seen by neurologist who placed him on a muscle relaxer. She states that patient has episodic body jerking from his first stroke, states that he was acting his normal self yesterday, but today he had more upper body flailing, and then had a seizure.  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  Home Medications Prior to Admission medications   Medication Sig Start Date End Date Taking? Authorizing Provider  acetaminophen (TYLENOL) 325 MG tablet Take 1-2 tablets (325-650 mg total) by mouth every 4 (four) hours as needed for mild pain. 05/04/21  Yes Love, Ivan Anchors, PA-C  amiodarone (PACERONE) 200 MG tablet TAKE 1 TABLET BY MOUTH ONCE DAILY. 06/04/21  Yes Kirsteins, Luanna Salk, MD  atorvastatin (LIPITOR) 80 MG tablet Take 1 tablet (80 mg total) by mouth daily. 05/04/21 04/29/22  Yes Love, Ivan Anchors, PA-C  calcium acetate (PHOSLO) 667 MG capsule Take 2 capsules (1,334 mg total) by mouth 3 (three) times daily with meals. 05/04/21  Yes Love, Ivan Anchors, PA-C  carvedilol (COREG) 25 MG tablet Take 25 mg by mouth 2 (two) times daily. 06/16/21  Yes [provider]  clonazePAM (KLONOPIN) 0.5 MG disintegrating tablet Take by mouth. 06/27/21 07/27/21 Yes [provider]  cloNIDine (CATAPRES) 0.1 MG tablet Take 1 tablet (0.1 mg total) by mouth 3 (three) times daily as needed (systolic blood pressure (top number) is Greater than 200 or DBP (lower number)is Greater than 110). 05/04/21  Yes Love, Ivan Anchors, PA-C  clopidogrel (PLAVIX) 75 MG tablet Take 1 tablet (75 mg total) by mouth daily. 05/04/21  Yes Love, Ivan Anchors, PA-C  cyclobenzaprine (FLEXERIL) 10 MG tablet Take 10 mg by mouth 3 (three) times daily. 06/09/21  Yes [provider]  dorzolamide-timolol (COSOPT) 22.3-6.8 MG/ML ophthalmic solution Place 1 drop into the right eye 2 (two) times daily.   Yes [provider]  ELIQUIS 2.5 MG TABS tablet Take 2.5 mg by mouth 2 (two) times daily. 06/09/21  Yes [provider]  erythromycin ophthalmic ointment SMARTSIG:In Eye(s) 06/19/21  Yes [provider]  hydrocortisone 2.5 % cream Place rectally. 06/04/21  Yes [provider]  neomycin-polymyxin b-dexamethasone (MAXITROL) 3.5-10000-0.1 [place] 1 application. into the left eye in the morning and at bedtime. 06/19/21  Yes [provider]  nitroGLYCERIN (NITROSTAT) 0.4 MG  SL tablet Place 1 tablet (0.4 mg total) under the tongue every 5 (five) minutes as needed for chest pain. 12/09/20 12/09/21 Yes Johnson, Clanford L, MD  phenylephrine (,USE FOR PREPARATION-H,) 0.25 % suppository Place 1 suppository rectally 2 (two) times daily as needed for hemorrhoids. 05/04/21  Yes Love, Ivan Anchors, PA-C  tamsulosin (FLOMAX) 0.4 MG CAPS capsule Take 0.4 mg by mouth daily. 05/19/21  Yes [provider]  witch hazel-glycerin (TUCKS) pad Apply topically as needed for itching. 05/04/21  Yes Love, Ivan Anchors, PA-C  clonazePAM (KLONOPIN) 0.5 MG disintegrating tablet Take 1 tablet (0.5 mg total) by mouth 2 (two) times daily. 06/27/21 07/27/21  Wyvonnia Dusky, MD  irbesartan (AVAPRO) 150 MG tablet Take 1 tablet (150 mg total) by mouth daily. Patient not taking: Reported on 07/26/2021 06/27/21 07/27/21  Wyvonnia Dusky, MD  lactobacillus (FLORANEX/LACTINEX) PACK Take 1 packet (1 g total) by mouth 3 (three) times daily with meals. Patient not taking: Reported on 07/26/2021 05/04/21   Love, Ivan Anchors, PA-C  melatonin 5 MG TABS Take 1 tablet (5 mg total) by mouth at bedtime as needed. Patient not taking: Reported on 07/26/2021 05/04/21   Love, Ivan Anchors, PA-C  prednisoLONE acetate (PRED FORTE) 1 % ophthalmic suspension Place 1 drop into the left eye every 4 (four) hours. Patient not taking: Reported on 07/26/2021 05/04/21   LoveIvan Anchors, PA-C  PREPARATION H 0.25-88.44 % suppository Place rectally. Patient not taking: Reported on 07/22/2021 05/04/21   [provider]      Allergies    Levofloxacin, Promethazine, Promethazine hcl, Other, and Malt    Review of Systems   Review of Systems  Unable to perform ROS: Mental status change   Physical Exam Updated Vital Signs BP 121/72   Pulse 72   Temp (!) 97.4 F (36.3 C) (Rectal) Comment: Simultaneous filing. User may not have seen previous data. Comment (Src): Simultaneous filing. User may not have seen previous data.  Resp (!) 22   Ht 6\' 3"  (1.905 m)   Wt 94.3 kg   SpO2 95%   BMI 26.00 kg/m  Physical Exam Vitals and nursing note reviewed.  Constitutional:      General: He is not in acute distress.    Appearance: Normal appearance. He is not ill-appearing or diaphoretic.  HENT:     Head: Normocephalic and atraumatic.     Nose: No congestion or rhinorrhea.     Mouth/Throat:     Mouth: Mucous membranes are moist.     Pharynx:  Oropharynx is clear.     Comments: No trismus no torticollis no trauma within the mouth Eyes:     General: No scleral icterus.       Right eye: No discharge.        Left eye: No discharge.     Conjunctiva/sclera: Conjunctivae normal.  Neck:     Comments: C-collar in place Cardiovascular:     Rate and Rhythm: Normal rate and regular rhythm.     Pulses: Normal pulses.  Pulmonary:     Effort: Pulmonary effort is normal. No respiratory distress.     Breath sounds: Normal breath sounds. No wheezing.  Chest:     Chest wall: No tenderness.  Abdominal:     Palpations: Abdomen is soft.     Tenderness: There is no abdominal tenderness. There is no right CVA tenderness or left CVA tenderness.  Musculoskeletal:     Cervical back: Neck supple.     Right  lower leg: No edema.     Left lower leg: No edema.     Comments: Patient is in a c-collar, spine was palpated was nontender to palpation no step-off deformities noted.  No pelvis instability no leg shortening.  Able to move all 4 extremities, upper and lower extremities were palpated they are nontender.  He has 2+ dorsal pedal pulses 2+ radial pulses.  Skin:    General: Skin is warm and dry.     Coloration: Skin is not jaundiced or pale.  Neurological:     Mental Status: He is alert and oriented to person, place, and time.     Comments: Patient slightly somnolent after given Versed but can respond yes or no, and follow simple commands, there is no facial asymmetry, no unilateral weakness present.  Psychiatric:        Mood and Affect: Mood normal.    ED Results / Procedures / Treatments   Labs (all labs ordered are listed, but only abnormal results are displayed) Labs Reviewed  COMPREHENSIVE METABOLIC PANEL - Abnormal; Notable for the following components:      Result Value   Potassium 6.5 (*)    CO2 18 (*)    BUN 79 (*)    Creatinine, Ser 11.07 (*)    Calcium 8.4 (*)    Albumin 3.2 (*)    AST 12 (*)    GFR, Estimated 5 (*)    All  other components within normal limits  CBC WITH DIFFERENTIAL/PLATELET - Abnormal; Notable for the following components:   Hemoglobin 10.0 (*)    HCT 33.7 (*)    MCV 73.1 (*)    MCH 21.7 (*)    MCHC 29.7 (*)    RDW 22.5 (*)    Eosinophils Absolute 0.6 (*)    All other components within normal limits  POTASSIUM - Abnormal; Notable for the following components:   Potassium 6.3 (*)    All other components within normal limits  MAGNESIUM  ETHANOL  POTASSIUM  POTASSIUM  POTASSIUM  HEPATITIS B SURFACE ANTIGEN  HEPATITIS B SURFACE ANTIBODY,QUALITATIVE  HEPATITIS B SURFACE ANTIBODY, QUANTITATIVE  HEPATITIS B CORE ANTIBODY, TOTAL  HEPATITIS C ANTIBODY  CBG MONITORING, ED  CBG MONITORING, ED    EKG EKG Interpretation  Date/Time:  Sunday July 26 2021 12:58:05 EDT Ventricular Rate:  70 PR Interval:  209 QRS Duration: 120 QT Interval:  421 QTC Calculation: 455 R Axis:   68 Text Interpretation: Sinus rhythm Left ventricular hypertrophy Non-specific ST-t changes Baseline wander Confirmed by Lajean Saver [phone]) on 07/26/2021 3:57:23 PM  Radiology DG Chest 1 View  Result Date: 07/26/2021 CLINICAL DATA:  Acute mental status change EXAM: CHEST  1 VIEW COMPARISON:  Jun 23, 2021 FINDINGS: Stable cardiomegaly. The hila and mediastinum are unchanged. The left central line is stable. No pneumothorax. Increased interstitial markings in the lungs consistent with mild pulmonary edema. IMPRESSION: Cardiomegaly and mild pulmonary edema. No other acute abnormalities. Electronically Signed   By: Dorise Bullion III M.D.   On: 07/26/2021 13:05   CT Head Wo Contrast  Result Date: 07/26/2021 CLINICAL DATA:  Mental status change, unknown cause EXAM: CT HEAD WITHOUT CONTRAST TECHNIQUE: Contiguous axial images were obtained from the base of the skull through the vertex without intravenous contrast. RADIATION DOSE REDUCTION: This exam was performed according to the departmental dose-optimization program which  includes automated exposure control, adjustment of the mA and/or kV according to patient size and/or use of iterative reconstruction technique. COMPARISON:  Same  day CT; CT dated Jun 23, 2021 FINDINGS: Brain: No evidence of acute infarction, hemorrhage, hydrocephalus, extra-axial collection or mass lesion/mass effect. Periventricular white matter hypodensities consistent with sequela of chronic microvascular ischemic disease. Vascular: Severe vascular calcifications. Skull: Normal. Negative for fracture or focal lesion. Sinuses/Orbits: Postsurgical changes are noted in the LEFT orbit with soft tissue air in the curvilinear prosthesis. Other: Similar soft tissue thickening along the posterior scalp. IMPRESSION: No acute intracranial abnormality. Electronically Signed   By: Valentino Saxon M.D.   On: 07/26/2021 18:36   CT Head Wo Contrast  Result Date: 07/26/2021 CLINICAL DATA:  Head trauma, moderate-severe; Neck trauma, dangerous injury mechanism (Age 47-64y) EXAM: CT HEAD WITHOUT CONTRAST CT CERVICAL SPINE WITHOUT CONTRAST TECHNIQUE: Multidetector CT imaging of the head and cervical spine was performed following the standard protocol without intravenous contrast. Multiplanar CT image reconstructions of the cervical spine were also generated. RADIATION DOSE REDUCTION: This exam was performed according to the departmental dose-optimization program which includes automated exposure control, adjustment of the mA and/or kV according to patient size and/or use of iterative reconstruction technique. COMPARISON:  CT dated June 16, 2021 FINDINGS: CT HEAD FINDINGS Brain: No evidence of acute infarction, hemorrhage, hydrocephalus, extra-axial collection or mass lesion/mass effect. Periventricular white matter hypodensities consistent with sequela of chronic microvascular ischemic disease. Vascular: Vascular calcifications. Skull: Normal. Negative for fracture or focal lesion. Sinuses/Orbits: Postsurgical changes within  the LEFT orbit with small amount of air and curvilinear likely prosthesis. Other: None CT CERVICAL SPINE FINDINGS Evaluation limited secondary to motion. Alignment: Straightening of the cervical lordosis. No acute spondylolisthesis. Skull base and vertebrae: No definitive acute fracture. Soft tissues and spinal canal: No prevertebral fluid or swelling. No visible canal hematoma. Disc levels: Moderate intervertebral disc space height loss with uncovertebral hypertrophy at C3-4. Mild multilevel intervertebral disc space height loss of the rest of the cervical spine. Upper chest: Diffuse vascular calcifications. Partial visualization of a LEFT chest CVC. Other: None IMPRESSION: 1.  No acute intracranial abnormality. 2. Postsurgical changes of the LEFT orbit. 3.  No acute fracture or static subluxation of the cervical spine. Electronically Signed   By: Valentino Saxon M.D.   On: 07/26/2021 13:39   CT Cervical Spine Wo Contrast  Result Date: 07/26/2021 CLINICAL DATA:  Head trauma, moderate-severe; Neck trauma, dangerous injury mechanism (Age 73-64y) EXAM: CT HEAD WITHOUT CONTRAST CT CERVICAL SPINE WITHOUT CONTRAST TECHNIQUE: Multidetector CT imaging of the head and cervical spine was performed following the standard protocol without intravenous contrast. Multiplanar CT image reconstructions of the cervical spine were also generated. RADIATION DOSE REDUCTION: This exam was performed according to the departmental dose-optimization program which includes automated exposure control, adjustment of the mA and/or kV according to patient size and/or use of iterative reconstruction technique. COMPARISON:  CT dated June 16, 2021 FINDINGS: CT HEAD FINDINGS Brain: No evidence of acute infarction, hemorrhage, hydrocephalus, extra-axial collection or mass lesion/mass effect. Periventricular white matter hypodensities consistent with sequela of chronic microvascular ischemic disease. Vascular: Vascular calcifications. Skull:  Normal. Negative for fracture or focal lesion. Sinuses/Orbits: Postsurgical changes within the LEFT orbit with small amount of air and curvilinear likely prosthesis. Other: None CT CERVICAL SPINE FINDINGS Evaluation limited secondary to motion. Alignment: Straightening of the cervical lordosis. No acute spondylolisthesis. Skull base and vertebrae: No definitive acute fracture. Soft tissues and spinal canal: No prevertebral fluid or swelling. No visible canal hematoma. Disc levels: Moderate intervertebral disc space height loss with uncovertebral hypertrophy at C3-4. Mild multilevel intervertebral disc space height  loss of the rest of the cervical spine. Upper chest: Diffuse vascular calcifications. Partial visualization of a LEFT chest CVC. Other: None IMPRESSION: 1.  No acute intracranial abnormality. 2. Postsurgical changes of the LEFT orbit. 3.  No acute fracture or static subluxation of the cervical spine. Electronically Signed   By: Valentino Saxon M.D.   On: 07/26/2021 13:39    Procedures .Critical Care Performed by: Marcello Fennel, PA-C Authorized by: Marcello Fennel, PA-C   Critical care provider statement:    Critical care time (minutes):  60   Critical care time was exclusive of:  Separately billable procedures and treating other patients   Critical care was necessary to treat or prevent imminent or life-threatening deterioration of the following conditions:  Metabolic crisis (Seizure, unresponsive)   Critical care was time spent personally by me on the following activities:  Development of treatment plan with patient or surrogate, discussions with consultants, evaluation of patient's response to treatment, examination of patient, ordering and review of laboratory studies, ordering and review of radiographic studies, ordering and performing treatments and interventions, pulse oximetry, re-evaluation of patient's condition and review of old charts   I assumed direction of critical  care for this patient from another provider in my specialty: no     Care discussed with: admitting provider      Medications Ordered in ED Medications  valproate (DEPACON) 1,880 mg in dextrose 5 % 50 mL IVPB (has no administration in time range)  Chlorhexidine Gluconate Cloth 2 % PADS 6 each (has no administration in time range)  cloNIDine (CATAPRES) tablet 0.1 mg (0.1 mg Oral Given 07/26/21 1300)  amLODipine (NORVASC) tablet 5 mg (5 mg Oral Given 07/26/21 1300)  sodium zirconium cyclosilicate (LOKELMA) packet 10 g (10 g Oral Given 07/26/21 1619)  calcium gluconate 1 g/ 50 mL sodium chloride IVPB (0 mg Intravenous Stopped 07/26/21 1710)  albuterol (PROVENTIL) (2.5 MG/3ML) 0.083% nebulizer solution 10 mg ( Nebulization Not Given 07/26/21 1805)  sodium bicarbonate injection 50 mEq (50 mEq Intravenous Given by Other 07/26/21 1640)  dextrose 50 % solution 25 g (25 g Intravenous Given 07/26/21 1708)  hydrALAZINE (APRESOLINE) injection 20 mg (20 mg Intravenous Given 07/26/21 1709)  LORazepam (ATIVAN) injection 1 mg (1 mg Intravenous Given 07/26/21 1720)    ED Course/ Medical Decision Making/ A&P                           Medical Decision Making Amount and/or Complexity of Data Reviewed Labs: ordered. Radiology: ordered. ECG/medicine tests: ordered.  Risk Prescription drug management.   This patient presents to the ED for concern of seizure, this involves an extensive number of treatment options, and is a complaint that carries with it a high risk of complications and morbidity.  The differential diagnosis includes CVA, intracranial head bleed, metabolic derailments    Additional history obtained:  Additional history obtained from wife at bedside External records from outside source obtained and reviewed including previous neurology notes   Co morbidities that complicate the patient evaluation  End-stage renal disease  Social Determinants of Health:  N/A    Lab Tests:  I Ordered, and  personally interpreted labs.  The pertinent results include: CBC shows microcytic anemia hemoglobin 10, CMP shows potassium 6.5, CO2 of 18, BUN 79 AST 12, ethanol less than 10, magnesium 1.9   Imaging Studies ordered:  I ordered imaging studies including CT head, C-spine, chest x-ray I independently visualized and interpreted  imaging which showed CT head and C-spine both negative acute findings, chest x-ray reveals mild vascular congestion. I agree with the radiologist interpretation   Cardiac Monitoring:  The patient was maintained on a cardiac monitor.  I personally viewed and interpreted the cardiac monitored which showed an underlying rhythm of: Without signs of ischemia   Medicines ordered and prescription drug management:  I ordered medication including amlodipine, clonidine I have reviewed the patients home medicines and have made adjustments as needed  Critical Interventions:  Patient has noted hyperkalemia, without EKG changes, patient was given albuterol, bicarb, calcium Lokelma.   Reevaluation:  Presents after seizure, benign physical exam, no the patient is a blood pressure greater than 263 systolic, wife endorse that he has not taken any of his blood pressure medication will provide this to him.  Obtain imaging of head neck chest obtain basic lab work-up and reassess.  Will provide patient with his home dose of hypertension medication  It is noted that lab work including CBC, CMP, ethanol, magnesium were all ordered at 12:33 PM today and they were not drawn until 1415 and were not released to me until 1600.  Throughout the day I called lab and spoke with the nurse different where the labs are and what the delay was.  I have placed a photo of my conversation with the nurse and patient's chart for reference.  Patient continues to have elevated blood pressure, he has hyperkalemia, I suspect this is all related to missed dialysis, will order hyperkalemia protocol which was  ordered at 1615 (patient received Lokelma at 1619, calcium and bicarb at 1640) insulin was not provided due to his glucose being 73.   will consult with nephrology for further recommendations.  Notified by nursing staff the patient another seizure I went and evaluate the patient he was found to be apneic, nonresponsive, and without palpable pulse.  CODE BLUE was called CPR was began.  After 1 round of CPR pulse check was obtained and he had a femoral pulse patient was starting to breathe on his own.  Patient was placed on nonrebreather and is currently being monitored.  We will consult with both nephrology as well as neurology for further recommendations.  Reassess after patient was provided with hydralazine, BP has improved, he was agitated after CPR and a 1 mg of Ativan was provided, he is now resting comfortably, currently on 4 L via nasal cannula.  Will consult hospitalist for admission.    Consultations Obtained:  I requested consultation with the nephrologist Dr.schretz ,  and discussed lab and imaging findings as well as pertinent plan - they recommend: Does not recommend emergent dialysis at this time, as he does not feel his hyperkalemia cause the seizure, recommend temporizing measures for hyperkalemia and provide him with hydralazine for his blood pressure.  Patient to hospitalist and he will evaluate the patient for likely dialysis tomorrow. Neurologist Dr. Cheral Marker he recommends patient receive a loading dose of Depakote 20 mg/kg.  And maintenance dose of Depakote  500 mg Depakote 3 times daily starting 8 hours after loading dose of Depakote.  Recommends obtaining repeat CT head, and patient will need EEG tomorrow.  If Patient has breakthrough seizures despite Depakote he would have to transfer him to [facility] for further evaluation. Received ta secure chat from Dr. Wilburt Finlay who is going to bring the patient to dialysis tonight, his dialysis nurses on the way, if becomes hypoxic recommend  patient on BiPAP.  Spoke with Dr. Linda Hedges admitting  team team will admit the patient.     Test Considered:  MRI-defer to my suspicion for CVA is very low at this time, there is no focal deficits, presentation is atypical etiology.    Rule out low suspicion for internal head bleed and or mass as CT imaging is negative for acute findings.  Low suspicion for CVA she has no focal deficit present my exam.  Low suspicion for dissection of the vertebral or carotid artery as presentation atypical of etiology.  Low suspicion for meningitis as she has no meningeal sign present.  I have low suspicion patient actually arrested today, I suspect likely he was postictal initially on my assessment I was unable to actually find a pulse.  I have low suspicion for ACS as EKG without signs of ischemia.  I have low suspicion for systemic infection patient nontoxic-appearing vital signs reassuring he does not meet sepsis or SIRS arteria.     Dispostion and problem list  After consideration of the diagnostic results and the patients response to treatment, I feel that the patent would benefit from admission.  Seizure-presentation most concerning for seizures, he was started on antiseizure medications, will need EEG and further assessment. Hyperkalemia, pulmonary edema-likely secondary due to missed dialysis, will undergo dialysis treatment for further evaluation. Hypertension-no evidence of organ damage, likely this was hypertensive urgency secondary to missed dialysis.             Final Clinical Impression(s) / ED Diagnoses Final diagnoses:  Seizure [facility])  Hyperkalemia    Rx / DC Orders ED Discharge Orders     None         Marcello Fennel, PA-C 07/26/21 1900    Marcello Fennel, PA-C 07/26/21 1901    Marcello Fennel, PA-C 07/26/21 1912    Milton Ferguson, MD 07/28/21 [phone]

## 2021-07-26 NOTE — H&P (Signed)
History and Physical    [name]. 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 He has had recent follow up and was dong better with control of myoclonus by Klonipin. He has had several episodes of full body shaking and falls. On the day of admission his wife reports she witness a an episode of fully body shaking with decreased LOC afterward. EMS was called and on arrival he had another witness episode of full body shaking, he was given benzodiazepine and was "post-ictal." He was brought to [facility] for further evaluation.     ED Course: T 97.4 160/92 IN ED he had further witnessed episodes of full body shaking and was minimally responsive afterward. He also had an episode of apnea, pulselessness for which CPR was intitiated. After one round of CPR he had a palpable femoral pulse. He did rquire oxygen support. Lab reveal hyperkalemia to 6.o w/o EKG changes, metabolic derangement. Dr. Chrisandra Netters for Neurology was consulted after these episodes and despite nl EEG in May recommended loading dose of depakote to be follow by depakote 500 mg TID. If he has further seizures transfer to [facility] may be required. Dr Jonnie Finner reviewed the case and recommended emergent HD, done in ED. [name] called to admit patient for further evaluation and management.   Review of Systems:  Review of Systems  Constitutional:  Negative for chills, fever and weight loss.  HENT: Negative.    Eyes:  Negative for double vision, photophobia and  pain.       Missing left eye.   Respiratory: Negative.    Cardiovascular:  Negative for chest pain and leg swelling.  Gastrointestinal: Negative.   Genitourinary: Negative.   Musculoskeletal: Negative.   Skin: Negative.   Neurological:  Positive for seizures.       Generalized tonic clonic episodes with post-ictal phase. CO2 low.   Endo/Heme/Allergies: Negative.   Psychiatric/Behavioral:         H/o depression.    Past Medical History:  Diagnosis Date   Anxiety    Cellulitis    CHF (congestive heart failure) (HCC)    Chronic diarrhea    Chronic kidney disease    Chronic pain of both ankles    Coronary artery disease    Diabetes mellitus without complication ([place])    Diabetes mellitus, type II ([place])    Diabetic neuropathy (HCC)    Dysrhythmia    Esophagitis    Frequent falls    Gait instability    Gastritis    GERD (gastroesophageal reflux disease)    H/O enucleation of left eyeball    Heart palpitations    Hemodialysis patient ([place])    History of kidney stones    HLD (hyperlipidemia)    Hypertension    Insomnia    Myocardial infarction ([place])    Nausea and vomiting in adult    recurrent   Stroke [facility])     Past Surgical History:  Procedure Laterality Date   AMPUTATION Left  03/16/2016   Procedure: AMPUTATION DIGIT LEFT HALLUX;  Surgeon: Trula Slade, DPM;  Location: [place];  Service: Podiatry;  Laterality: Left;  can start around 5    AMPUTATION Right 02/09/2021   Procedure: AMPUTATION DIGIT;  Surgeon: Sherilyn Cooter, MD;  Location: [place];  Service: Orthopedics;  Laterality: Right;   ARTHROSCOPIC REPAIR ACL Left    AV FISTULA PLACEMENT Right 05/31/2019   Procedure: Brachiocephalic AV fistula creation;  Surgeon: Algernon Huxley, MD;  Location: [name] ORS;  Service: Vascular;  Laterality: Right;   COLONOSCOPY WITH PROPOFOL N/A 10/28/2015   Procedure: COLONOSCOPY WITH PROPOFOL;  Surgeon: Lollie Sails, MD;  Location: [facility] ENDOSCOPY;  Service: Endoscopy;   Laterality: N/A;   COLONOSCOPY WITH PROPOFOL N/A 10/29/2015   Procedure: COLONOSCOPY WITH PROPOFOL;  Surgeon: Lollie Sails, MD;  Location: [facility] ENDOSCOPY;  Service: Endoscopy;  Laterality: N/A;   CORONARY/GRAFT ACUTE MI REVASCULARIZATION N/A 02/26/2020   Procedure: Coronary/Graft Acute MI Revascularization;  Surgeon: Jettie Booze, MD;  Location: [place] CV LAB;  Service: Cardiovascular;  Laterality: N/A;   DIALYSIS/PERMA CATHETER INSERTION Right 04/26/2019   Perm Cath    DIALYSIS/PERMA CATHETER INSERTION N/A 04/26/2019   Procedure: DIALYSIS/PERMA CATHETER INSERTION;  Surgeon: Algernon Huxley, MD;  Location: [place] CV LAB;  Service: Cardiovascular;  Laterality: N/A;   DIALYSIS/PERMA CATHETER INSERTION N/A 05/25/2021   Procedure: DIALYSIS/PERMA CATHETER INSERTION;  Surgeon: Algernon Huxley, MD;  Location: [place] CV LAB;  Service: Cardiovascular;  Laterality: N/A;   DIALYSIS/PERMA CATHETER REMOVAL N/A 08/15/2019   Procedure: DIALYSIS/PERMA CATHETER REMOVAL;  Surgeon: Katha Cabal, MD;  Location: [place] CV LAB;  Service: Cardiovascular;  Laterality: N/A;   EMBOLIZATION Right 02/06/2021   Procedure: EMBOLIZATION;  Surgeon: Algernon Huxley, MD;  Location: [place] CV LAB;  Service: Cardiovascular;  Laterality: Right;  Right Upper Extremity Dialysis Access, Permcath Placement   ESOPHAGOGASTRODUODENOSCOPY (EGD) WITH PROPOFOL N/A 12/27/2017   Procedure: ESOPHAGOGASTRODUODENOSCOPY (EGD) WITH PROPOFOL;  Surgeon: [place], Benay Pike, MD;  Location: [name] ENDOSCOPY;  Service: Gastroenterology;  Laterality: N/A;   EYE SURGERY     INTRAVASCULAR ULTRASOUND/IVUS N/A 02/26/2020   Procedure: Intravascular Ultrasound/IVUS;  Surgeon: Jettie Booze, MD;  Location: [place] CV LAB;  Service: Cardiovascular;  Laterality: N/A;   LEFT HEART CATH AND CORONARY ANGIOGRAPHY N/A 02/26/2020   Procedure: LEFT HEART CATH AND CORONARY ANGIOGRAPHY;  Surgeon: Jettie Booze, MD;   Location: [place] CV LAB;  Service: Cardiovascular;  Laterality: N/A;   PROSTATE SURGERY  2016   TONSILECTOMY/ADENOIDECTOMY WITH MYRINGOTOMY     TONSILLECTOMY     UPPER EXTREMITY ANGIOGRAPHY Right 11/26/2020   Procedure: Upper Extremity Angiography;  Surgeon: Algernon Huxley, MD;  Location: [place] CV LAB;  Service: Cardiovascular;  Laterality: Right;   UPPER EXTREMITY ANGIOGRAPHY Right 02/05/2021   Procedure: UPPER EXTREMITY ANGIOGRAPHY;  Surgeon: Algernon Huxley, MD;  Location: [place] CV LAB;  Service: Cardiovascular;  Laterality: Right;    Soc Hx - married 31 years. Had two sons, one son killed recently in [place]. He worked in [employer] - for 17 years recently retired. Wife is a Education officer, museum. The y live together at home.   reports that he has never smoked. He has never used smokeless tobacco. He reports that he does not drink alcohol and does not use drugs.  Allergies  Allergen Reactions   Levofloxacin Swelling    Facial swelling Other reaction(s): swelling   Promethazine Diarrhea and  Other (See Comments)    Muscle cramps   Promethazine Hcl Other (See Comments)    Cramping all over  Other reaction(s): cramp all over   Other     Other reaction(s): Unknown Other reaction(s): Unknown   Malt Other (See Comments)    Cramping    Family History  Problem Relation Age of Onset   CAD Father    Stroke Father    Diabetes Mellitus II Mother    Kidney failure Mother    Schizophrenia Mother     Prior to Admission medications   Medication Sig Start Date End Date Taking? Authorizing Provider  acetaminophen (TYLENOL) 325 MG tablet Take 1-2 tablets (325-650 mg total) by mouth every 4 (four) hours as needed for mild pain. 05/04/21  Yes Love, Ivan Anchors, PA-C  amiodarone (PACERONE) 200 MG tablet TAKE 1 TABLET BY MOUTH ONCE DAILY. 06/04/21  Yes Kirsteins, Luanna Salk, MD  atorvastatin (LIPITOR) 80 MG tablet Take 1 tablet (80 mg total) by mouth daily. 05/04/21 04/29/22  Yes Love, Ivan Anchors, PA-C  calcium acetate (PHOSLO) 667 MG capsule Take 2 capsules (1,334 mg total) by mouth 3 (three) times daily with meals. 05/04/21  Yes Love, Ivan Anchors, PA-C  carvedilol (COREG) 25 MG tablet Take 25 mg by mouth 2 (two) times daily. 06/16/21  Yes [provider]  clonazePAM (KLONOPIN) 0.5 MG disintegrating tablet Take by mouth. 06/27/21 07/27/21 Yes [provider]  cloNIDine (CATAPRES) 0.1 MG tablet Take 1 tablet (0.1 mg total) by mouth 3 (three) times daily as needed (systolic blood pressure (top number) is Greater than 200 or DBP (lower number)is Greater than 110). 05/04/21  Yes Love, Ivan Anchors, PA-C  clopidogrel (PLAVIX) 75 MG tablet Take 1 tablet (75 mg total) by mouth daily. 05/04/21  Yes Love, Ivan Anchors, PA-C  cyclobenzaprine (FLEXERIL) 10 MG tablet Take 10 mg by mouth 3 (three) times daily. 06/09/21  Yes [provider]  dorzolamide-timolol (COSOPT) 22.3-6.8 MG/ML ophthalmic solution Place 1 drop into the right eye 2 (two) times daily.   Yes [provider]  ELIQUIS 2.5 MG TABS tablet Take 2.5 mg by mouth 2 (two) times daily. 06/09/21  Yes [provider]  erythromycin ophthalmic ointment SMARTSIG:In Eye(s) 06/19/21  Yes [provider]  hydrocortisone 2.5 % cream Place rectally. 06/04/21  Yes [provider]  neomycin-polymyxin b-dexamethasone (MAXITROL) 3.5-10000-0.1 [place] 1 application. into the left eye in the morning and at bedtime. 06/19/21  Yes [provider]  nitroGLYCERIN (NITROSTAT) 0.4 MG SL tablet Place 1 tablet (0.4 mg total) under the tongue every 5 (five) minutes as needed for chest pain. 12/09/20 12/09/21 Yes Johnson, Clanford L, MD  phenylephrine (,USE FOR PREPARATION-H,) 0.25 % suppository Place 1 suppository rectally 2 (two) times daily as needed for hemorrhoids. 05/04/21  Yes Love, Ivan Anchors, PA-C  tamsulosin (FLOMAX) 0.4 MG CAPS capsule Take 0.4 mg by mouth daily. 05/19/21  Yes [provider]  witch hazel-glycerin (TUCKS) pad Apply topically as needed for itching. 05/04/21  Yes Love, Ivan Anchors, PA-C  clonazePAM (KLONOPIN) 0.5 MG disintegrating tablet Take 1 tablet (0.5 mg total) by mouth 2 (two) times daily. 06/27/21 07/27/21  Wyvonnia Dusky, MD  irbesartan (AVAPRO) 150 MG tablet Take 1 tablet (150 mg total) by mouth daily. Patient not taking: Reported on 07/26/2021 06/27/21 07/27/21  Wyvonnia Dusky, MD  lactobacillus (FLORANEX/LACTINEX) PACK Take 1 packet (1 g total) by mouth 3 (three) times daily with meals. Patient not taking: Reported on  07/26/2021 05/04/21   Love, Ivan Anchors, PA-C  melatonin 5 MG TABS Take 1 tablet (5 mg total) by mouth at bedtime as needed. Patient not taking: Reported on 07/26/2021 05/04/21   Love, Ivan Anchors, PA-C  prednisoLONE acetate (PRED FORTE) 1 % ophthalmic suspension Place 1 drop into the left eye every 4 (four) hours. Patient not taking: Reported on 07/26/2021 05/04/21   LoveIvan Anchors, PA-C  PREPARATION H 0.25-88.44 % suppository Place rectally. Patient not taking: Reported on 07/22/2021 05/04/21   [provider]    Physical Exam: Vitals:   07/26/21 2245 07/26/21 2300 07/26/21 2315 07/26/21 2330  BP: (!) 182/103 (!) 177/98 (!) 162/96   Pulse: 83 82 83 83  Resp: 20 16 16 20   Temp:      TempSrc:      SpO2: 98% 98% 99% 98%  Weight:      Height:        Physical Exam Vitals reviewed.  Constitutional:      General: He is not in acute distress.    Appearance: He is obese.     Comments: Exam limited as patient on dialysis and also a little groggy.   HENT:     Head: Normocephalic and atraumatic.     Mouth/Throat:     Mouth: Mucous membranes are dry.  Eyes:     Comments: Large dressing over left orbit after explantation of implant 07/24/21.  Right eye appears nl but limited exam  Cardiovascular:     Rate and Rhythm: Normal rate and regular rhythm.     Heart sounds: Normal heart sounds.     Comments: 2+ radial pulse left.   Pulmonary:     Effort: Pulmonary effort is normal. No respiratory distress.     Breath sounds: Normal breath sounds. No rales.     Comments: Limited exam - patient unable to sit up while on HD Abdominal:     General: Bowel sounds are normal.     Palpations: Abdomen is soft.  Musculoskeletal:     Cervical back: Neck supple. No rigidity.  Skin:    General: Skin is warm and dry.     Coloration: Skin is not jaundiced.     Comments: Temporary HD access chest catheter - in use for HD  Neurological:     Comments: Limited exam. Patient awake. Answers question with one syllable words, follows commands.     Labs on Admission: I have personally reviewed following labs and imaging studies  CBC: Recent Labs  Lab 07/26/21 1415  WBC 8.4  NEUTROABS 5.5  HGB 10.0*  HCT 33.7*  MCV 73.1*  PLT 202   Basic Metabolic Panel: Recent Labs  Lab 07/26/21 1415 07/26/21 1638 07/26/21 1934 07/26/21 2358  NA 139  --   --   --   K 6.5* 6.3* 6.0* 6.0*  CL 108  --   --   --   CO2 18*  --   --   --   GLUCOSE 70  --   --   --   BUN 79*  --   --   --   CREATININE 11.07*  --   --   --   CALCIUM 8.4*  --   --   --   MG 1.9  --   --   --    GFR: Estimated Creatinine Clearance: 9.2 mL/min (A) (by C-G formula based on SCr of 11.07 mg/dL (H)). Liver Function Tests: Recent Labs  Lab 07/26/21 1415  AST 12*  ALT 18  ALKPHOS 98  BILITOT 0.6  PROT 6.5  ALBUMIN 3.2*   No results for input(s): LIPASE, AMYLASE in the last 168 hours. No results for input(s): AMMONIA in the last 168 hours. Coagulation Profile: No results for input(s): INR, PROTIME in the last 168 hours. Cardiac Enzymes: No results for input(s): CKTOTAL, CKMB, CKMBINDEX, TROPONINI in the last 168 hours. BNP (last 3 results) No results for input(s): PROBNP in the last 8760 hours. HbA1C: No results for input(s): HGBA1C in the last 72 hours. CBG: Recent Labs  Lab 07/26/21 1103 07/26/21 1657  GLUCAP 73 72   Lipid Profile: No  results for input(s): CHOL, HDL, LDLCALC, TRIG, CHOLHDL, LDLDIRECT in the last 72 hours. Thyroid Function Tests: No results for input(s): TSH, T4TOTAL, FREET4, T3FREE, THYROIDAB in the last 72 hours. Anemia Panel: No results for input(s): VITAMINB12, FOLATE, FERRITIN, TIBC, IRON, RETICCTPCT in the last 72 hours. Urine analysis:    Component Value Date/Time   COLORURINE YELLOW (A) 11/07/2017 1855   APPEARANCEUR CLOUDY (A) 11/07/2017 1855   LABSPEC 1.016 11/07/2017 1855   PHURINE 5.0 11/07/2017 1855   GLUCOSEU >=500 (A) 11/07/2017 1855   HGBUR MODERATE (A) 11/07/2017 1855   BILIRUBINUR NEGATIVE 11/07/2017 1855   KETONESUR NEGATIVE 11/07/2017 1855   PROTEINUR >=300 (A) 11/07/2017 1855   NITRITE NEGATIVE 11/07/2017 1855   LEUKOCYTESUR NEGATIVE 11/07/2017 1855    Radiological Exams on Admission: I have personally reviewed images DG Chest 1 View  Result Date: 07/26/2021 CLINICAL DATA:  Acute mental status change EXAM: CHEST  1 VIEW COMPARISON:  Jun 23, 2021 FINDINGS: Stable cardiomegaly. The hila and mediastinum are unchanged. The left central line is stable. No pneumothorax. Increased interstitial markings in the lungs consistent with mild pulmonary edema. IMPRESSION: Cardiomegaly and mild pulmonary edema. No other acute abnormalities. Electronically Signed   By: Dorise Bullion III M.D.   On: 07/26/2021 13:05   CT Head Wo Contrast  Result Date: 07/26/2021 CLINICAL DATA:  Mental status change, unknown cause EXAM: CT HEAD WITHOUT CONTRAST TECHNIQUE: Contiguous axial images were obtained from the base of the skull through the vertex without intravenous contrast. RADIATION DOSE REDUCTION: This exam was performed according to the departmental dose-optimization program which includes automated exposure control, adjustment of the mA and/or kV according to patient size and/or use of iterative reconstruction technique. COMPARISON:  Same day CT; CT dated Jun 23, 2021 FINDINGS: Brain: No evidence of acute  infarction, hemorrhage, hydrocephalus, extra-axial collection or mass lesion/mass effect. Periventricular white matter hypodensities consistent with sequela of chronic microvascular ischemic disease. Vascular: Severe vascular calcifications. Skull: Normal. Negative for fracture or focal lesion. Sinuses/Orbits: Postsurgical changes are noted in the LEFT orbit with soft tissue air in the curvilinear prosthesis. Other: Similar soft tissue thickening along the posterior scalp. IMPRESSION: No acute intracranial abnormality. Electronically Signed   By: Valentino Saxon M.D.   On: 07/26/2021 18:36   CT Head Wo Contrast  Result Date: 07/26/2021 CLINICAL DATA:  Head trauma, moderate-severe; Neck trauma, dangerous injury mechanism (Age 16-64y) EXAM: CT HEAD WITHOUT CONTRAST CT CERVICAL SPINE WITHOUT CONTRAST TECHNIQUE: Multidetector CT imaging of the head and cervical spine was performed following the standard protocol without intravenous contrast. Multiplanar CT image reconstructions of the cervical spine were also generated. RADIATION DOSE REDUCTION: This exam was performed according to the departmental dose-optimization program which includes automated exposure control, adjustment of the mA and/or kV according to patient size and/or use of iterative reconstruction technique. COMPARISON:  CT dated  June 16, 2021 FINDINGS: CT HEAD FINDINGS Brain: No evidence of acute infarction, hemorrhage, hydrocephalus, extra-axial collection or mass lesion/mass effect. Periventricular white matter hypodensities consistent with sequela of chronic microvascular ischemic disease. Vascular: Vascular calcifications. Skull: Normal. Negative for fracture or focal lesion. Sinuses/Orbits: Postsurgical changes within the LEFT orbit with small amount of air and curvilinear likely prosthesis. Other: None CT CERVICAL SPINE FINDINGS Evaluation limited secondary to motion. Alignment: Straightening of the cervical lordosis. No acute  spondylolisthesis. Skull base and vertebrae: No definitive acute fracture. Soft tissues and spinal canal: No prevertebral fluid or swelling. No visible canal hematoma. Disc levels: Moderate intervertebral disc space height loss with uncovertebral hypertrophy at C3-4. Mild multilevel intervertebral disc space height loss of the rest of the cervical spine. Upper chest: Diffuse vascular calcifications. Partial visualization of a LEFT chest CVC. Other: None IMPRESSION: 1.  No acute intracranial abnormality. 2. Postsurgical changes of the LEFT orbit. 3.  No acute fracture or static subluxation of the cervical spine. Electronically Signed   By: Valentino Saxon M.D.   On: 07/26/2021 13:39   CT Cervical Spine Wo Contrast  Result Date: 07/26/2021 CLINICAL DATA:  Head trauma, moderate-severe; Neck trauma, dangerous injury mechanism (Age 61-64y) EXAM: CT HEAD WITHOUT CONTRAST CT CERVICAL SPINE WITHOUT CONTRAST TECHNIQUE: Multidetector CT imaging of the head and cervical spine was performed following the standard protocol without intravenous contrast. Multiplanar CT image reconstructions of the cervical spine were also generated. RADIATION DOSE REDUCTION: This exam was performed according to the departmental dose-optimization program which includes automated exposure control, adjustment of the mA and/or kV according to patient size and/or use of iterative reconstruction technique. COMPARISON:  CT dated June 16, 2021 FINDINGS: CT HEAD FINDINGS Brain: No evidence of acute infarction, hemorrhage, hydrocephalus, extra-axial collection or mass lesion/mass effect. Periventricular white matter hypodensities consistent with sequela of chronic microvascular ischemic disease. Vascular: Vascular calcifications. Skull: Normal. Negative for fracture or focal lesion. Sinuses/Orbits: Postsurgical changes within the LEFT orbit with small amount of air and curvilinear likely prosthesis. Other: None CT CERVICAL SPINE FINDINGS Evaluation  limited secondary to motion. Alignment: Straightening of the cervical lordosis. No acute spondylolisthesis. Skull base and vertebrae: No definitive acute fracture. Soft tissues and spinal canal: No prevertebral fluid or swelling. No visible canal hematoma. Disc levels: Moderate intervertebral disc space height loss with uncovertebral hypertrophy at C3-4. Mild multilevel intervertebral disc space height loss of the rest of the cervical spine. Upper chest: Diffuse vascular calcifications. Partial visualization of a LEFT chest CVC. Other: None IMPRESSION: 1.  No acute intracranial abnormality. 2. Postsurgical changes of the LEFT orbit. 3.  No acute fracture or static subluxation of the cervical spine. Electronically Signed   By: Valentino Saxon M.D.   On: 07/26/2021 13:39    EKG: I have personally reviewed EKG: Sinus rhythm LVH, increased PR interval , no acute change  Assessment/Plan Principal Problem:   Seizure (HCC) Active Problems:   ESRD (end stage renal disease) (HCC)   Hyperkalemia   Essential hypertension, benign   Uncontrolled type 2 diabetes mellitus with hyperglycemia, with long-term current use of insulin (HCC)   Paroxysmal atrial fibrillation (HCC)   Chronic Grade 2 diastolic heart failure/EF 55 to 60%   Benign prostatic hyperplasia with lower urinary tract symptoms   NVG (neovascular glaucoma), left, indeterminate stage    Assessment and Plan: * Seizure ([place]) 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 He has had EEG recently with no epileptic findings. Now with several witnessed episodes of full body shaking  with post-ictal state. He received 1880 mg Depakote loading dose  Plan Depakote 500 mg TID  EEG  Neurology to follow up  Hyperkalemia Per ESRD above - patient had HD  Plan F/u Bmet 2 hours after HD and daily  ESRD (end stage renal disease) ([place]) Patient with ESRD on HD MWF but missed HD Friday. 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 Has had temporary chest catheter for 3 months. Is scheduled for access creation left UE for Thursday, 07/30/21. He presents with seizures and hyperkalemia. He had HD in ED managed by Dr. Jonnie Finner  Plan Per nephrology as to next HD  Paroxysmal atrial fibrillation [facility]) Currently in sinus rhythm  Plan Continue Eliquis  Tele admit  Uncontrolled type 2 diabetes mellitus with hyperglycemia, with long-term current use of insulin ([place]) Last A1C 04/21/21 4.7%. Patient has been taken off of medications  Plan Ss while in-patient  Essential hypertension, benign BP was elevated at admission.  Plan Continue home regimen.  NVG (neovascular glaucoma), left, indeterminate stage Patient had exoneration OS. Had orbital implant for prothesis. This was removed 07/24/21.  Plan Continue ophthalmic medications  Benign prostatic hyperplasia with lower urinary tract symptoms Continue home meds  Chronic Grade 2 diastolic heart failure/EF 55 to 60% CXR with mild pulmonary edema. Had missed HD but has subsequently been dialyzed. Last ECHO 04/22/21 with EF 55-60%, Grade I DD  Plan Add BNP to lab  Continue home meds       DVT prophylaxis: Eliquis Code Status: Full Code Family Communication: spoke with Mrs. Pense - updated her to current condition and plan.  Disposition Plan: TBD  Consults called: Nephrology - Dr Jonnie Finner, Neurology- Dr. Chrisandra Netters  Admission status: Inpatient, Telemetry bed   Adella Hare, MD [facility] 07/26/2021, 11:44 PM

## 2021-07-26 NOTE — Assessment & Plan Note (Signed)
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 He has had EEG recently with no epileptic findings. Now with several witnessed episodes of full body shaking with post-ictal state. He received 1880 mg Depakote loading dose  Plan Depakote 500 mg TID  EEG  Neurology to follow up

## 2021-07-26 NOTE — ED Notes (Signed)
[name] aware of blood pressure.

## 2021-07-26 NOTE — Progress Notes (Deleted)
Cardiology Office Note:    Date:  07/26/2021   ID:  [name]., DOB 11/14/1970, MRN [id number]  PCP:  [name], MD   [facility] Providers Cardiologist:  Larae Grooms, MD { Click to update primary MD,subspecialty MD or APP then REFRESH:1}    Referring MD: [name], MD   Chief Complaint: ***  History of Present Illness:    [name]. is a *** 51 y.o. male with a hx of ESRD on HD, diabetes, hypertension, hyperlipidemia, CAD, gastroparesis, and stroke (bilateral frontal and right parietal in 03/2021, left hippocampus 01/2021).   Prior anterior STEMI treated with DES to LAD January 2022 due to 90% stenosis.  Minimal nonobstructive disease in other vessels.  He had atrial fibrillation at the time and was treated with amiodarone, oral anticoagulation.  Echo 11/2020 revealed normal LVEF 55 to 60%, no RWMA, grade 2 diastolic dysfunction, LA mild to moderately dilated, RA mildly dilated, small to moderate pericardial effusion without tamponade, borderline dilatation aortic root at 38 mm, mildly dilated pulmonary artery.  He had CVA (left hippocampus) December 2022 in the setting of holding [facility] for amputation of right upper extremity finger.   Phone call 04/06/2021 he was recommended to skip carvedilol prior to dialysis due to hypotension.  He was a no-show for Myoview scheduled for 04/08/2021 which was ordered per patient request due to possible kidney transplant.  He was last seen in our office on 04/20/21 by Laurann Montana, NP at which time he presented for preop cardiac evaluation thyroidectomy with reimplantation and gland exploration as well as pending renal transplant.  EKG showed new inferior changes.  He denies chest pain however given EKG changes he was advised to get a [employer], however there is no record that it has been completed.  Admission 05/25/21 for Perma Cath exchange. Admission 5/2-06/27/21 following fall and involuntary jerking movements  observed by his wife for 1 week.  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  Today, he is here  This clearance request was directed to Dr. Irish Lack, primary cardiologist, who advised patient may proceed to surgery without stress test as long as he is chest pain-free.  Per Dr. Lucky Cowboy, requesting surgeon, he will perform the procedure with patient remaining on his Plavix.  He has requested that he hold Eliquis only on the day of the procedure.  Past Medical History:  Diagnosis Date   Anxiety    Cellulitis    CHF (congestive heart failure) (HCC)    Chronic diarrhea    Chronic kidney disease    Chronic pain of both ankles    Coronary artery disease    Diabetes mellitus without complication ([place])    Diabetes mellitus, type II ([place])    Diabetic neuropathy (HCC)    Dysrhythmia    Esophagitis    Frequent falls    Gait instability    Gastritis    GERD (gastroesophageal reflux disease)    H/O enucleation of left eyeball    Heart palpitations    Hemodialysis patient ([place])    History of kidney stones    HLD (hyperlipidemia)    Hypertension    Insomnia    Myocardial infarction ([place])    Nausea and vomiting in adult    recurrent   Stroke [facility])     Past Surgical History:  Procedure Laterality Date   AMPUTATION Left 03/16/2016   Procedure: AMPUTATION DIGIT LEFT HALLUX;  Surgeon: Trula Slade,  DPM;  Location: [place];  Service: Podiatry;  Laterality: Left;  can start around 5    AMPUTATION Right 02/09/2021   Procedure: AMPUTATION DIGIT;  Surgeon: Sherilyn Cooter, MD;  Location: [place];  Service: Orthopedics;  Laterality: Right;   ARTHROSCOPIC REPAIR ACL Left    AV FISTULA PLACEMENT Right 05/31/2019   Procedure: Brachiocephalic AV fistula creation;  Surgeon: Algernon Huxley, MD;  Location: [name] ORS;  Service: Vascular;  Laterality: Right;   COLONOSCOPY WITH PROPOFOL N/A 10/28/2015   Procedure:  COLONOSCOPY WITH PROPOFOL;  Surgeon: Lollie Sails, MD;  Location: [facility] ENDOSCOPY;  Service: Endoscopy;  Laterality: N/A;   COLONOSCOPY WITH PROPOFOL N/A 10/29/2015   Procedure: COLONOSCOPY WITH PROPOFOL;  Surgeon: Lollie Sails, MD;  Location: [facility] ENDOSCOPY;  Service: Endoscopy;  Laterality: N/A;   CORONARY/GRAFT ACUTE MI REVASCULARIZATION N/A 02/26/2020   Procedure: Coronary/Graft Acute MI Revascularization;  Surgeon: Jettie Booze, MD;  Location: [place] CV LAB;  Service: Cardiovascular;  Laterality: N/A;   DIALYSIS/PERMA CATHETER INSERTION Right 04/26/2019   Perm Cath    DIALYSIS/PERMA CATHETER INSERTION N/A 04/26/2019   Procedure: DIALYSIS/PERMA CATHETER INSERTION;  Surgeon: Algernon Huxley, MD;  Location: [place] CV LAB;  Service: Cardiovascular;  Laterality: N/A;   DIALYSIS/PERMA CATHETER INSERTION N/A 05/25/2021   Procedure: DIALYSIS/PERMA CATHETER INSERTION;  Surgeon: Algernon Huxley, MD;  Location: [place] CV LAB;  Service: Cardiovascular;  Laterality: N/A;   DIALYSIS/PERMA CATHETER REMOVAL N/A 08/15/2019   Procedure: DIALYSIS/PERMA CATHETER REMOVAL;  Surgeon: Katha Cabal, MD;  Location: [place] CV LAB;  Service: Cardiovascular;  Laterality: N/A;   EMBOLIZATION Right 02/06/2021   Procedure: EMBOLIZATION;  Surgeon: Algernon Huxley, MD;  Location: [place] CV LAB;  Service: Cardiovascular;  Laterality: Right;  Right Upper Extremity Dialysis Access, Permcath Placement   ESOPHAGOGASTRODUODENOSCOPY (EGD) WITH PROPOFOL N/A 12/27/2017   Procedure: ESOPHAGOGASTRODUODENOSCOPY (EGD) WITH PROPOFOL;  Surgeon: [place], Benay Pike, MD;  Location: [name] ENDOSCOPY;  Service: Gastroenterology;  Laterality: N/A;   INTRAVASCULAR ULTRASOUND/IVUS N/A 02/26/2020   Procedure: Intravascular Ultrasound/IVUS;  Surgeon: Jettie Booze, MD;  Location: [place] CV LAB;  Service: Cardiovascular;  Laterality: N/A;   LEFT HEART CATH AND CORONARY ANGIOGRAPHY N/A 02/26/2020   Procedure:  LEFT HEART CATH AND CORONARY ANGIOGRAPHY;  Surgeon: Jettie Booze, MD;  Location: [place] CV LAB;  Service: Cardiovascular;  Laterality: N/A;   PROSTATE SURGERY  2016   TONSILECTOMY/ADENOIDECTOMY WITH MYRINGOTOMY     TONSILLECTOMY     UPPER EXTREMITY ANGIOGRAPHY Right 11/26/2020   Procedure: Upper Extremity Angiography;  Surgeon: Algernon Huxley, MD;  Location: [place] CV LAB;  Service: Cardiovascular;  Laterality: Right;   UPPER EXTREMITY ANGIOGRAPHY Right 02/05/2021   Procedure: UPPER EXTREMITY ANGIOGRAPHY;  Surgeon: Algernon Huxley, MD;  Location: [place] CV LAB;  Service: Cardiovascular;  Laterality: Right;    Current Medications: No outpatient medications have been marked as taking for the 07/27/21 encounter (Appointment) with [name] Maki, Lanice Schwab, NP.     Allergies:   Levofloxacin, Promethazine, Promethazine hcl, Other, and Malt   Social History   Socioeconomic History   Marital status: Married    Spouse name: [name]    Number of children: 2   Years of education: Not on file   Highest education level: High school graduate  Occupational History   Occupation: Disability    Comment: not employed  Tobacco Use   Smoking status: Never   Smokeless tobacco: Never  Vaping Use   Vaping  Use: Never used  Substance and Sexual Activity   Alcohol use: No   Drug use: No   Sexual activity: Yes  Other Topics Concern   Not on file  Social History Narrative   Oldest son killed in car crash June 2020.    Social Determinants of Health   Financial Resource Strain: Not on file  Food Insecurity: Not on file  Transportation Needs: Not on file  Physical Activity: Not on file  Stress: Not on file  Social Connections: Not on file     Family History: The patient's ***family history includes CAD in his father; Diabetes Mellitus II in his mother; Kidney failure in his mother; Schizophrenia in his mother; Stroke in his father.  ROS:   Please see the history of present  illness.    *** All other systems reviewed and are negative.  Labs/Other Studies Reviewed:    The following studies were reviewed today:  Echo 04/22/21   1. Left ventricular ejection fraction, by estimation, is 55 to 60%. The  left ventricle has normal function. The left ventricle has no regional  wall motion abnormalities. There is severe concentric left ventricular  hypertrophy. Left ventricular diastolic   parameters are consistent with Grade I diastolic dysfunction (impaired  relaxation).   2. Right ventricular systolic function is normal. The right ventricular  size is normal. Tricuspid regurgitation signal is inadequate for assessing  PA pressure.   3. Left atrial size was severely dilated.   4. Moderate pericardial effusion.   5. The mitral valve is grossly normal. Trivial mitral valve  regurgitation. No evidence of mitral stenosis.   6. The aortic valve is tricuspid. Aortic valve regurgitation is not  visualized.   7. Aortic dilatation noted. There is mild dilatation of the aortic root,  measuring 44 mm.   8. The inferior vena cava is normal in size with greater than 50%  respiratory variability, suggesting right atrial pressure of 3 mmHg.   Comparison(s): Improvement in pericardial effusion.    LHC 02/26/20  The left ventricular systolic function is normal. LV end diastolic pressure is normal. The left ventricular ejection fraction is greater than 65% by visual estimate. There is no aortic valve stenosis. Mid LAD lesion is 90% stenosed. A drug-eluting stent was successfully placed using a STENT RESOLUTE ONYX 3.5X30, postdilated to > 4 mm. Stent optimized with IVUS. Post intervention, there is a 0% residual stenosis.   I suspect his abnormal ECG and anterior injury pattern was more from demand ischemia in the setting of hypotension and AFib with RVR, with severe mid LAD lesion.   During the cath, he converted to NSR briefly and his pulse pressure and BP returned to  normal without any pressors. In addition, LVEDP only 11 mm Hg despite having 2.4 L NS administered on transport.  He may have been a little dehydrated after 3 L fluid removal after dialysis.    In regards to AFib.  Continue IV Amiodarone.  Will have to decide if he needs longterm anticoagulation along with antiplatelet therapy.  If so, would change Brilinta to clopidogrel.  Would have to check what options work in the setting of ESRD.   US Carotid Bilateral 06/2017  RIGHT CAROTID ARTERY: Flow is demonstrated throughout the right carotid bulb and bifurcation on color flow Doppler imaging. No significant stenosis identified.   RIGHT VERTEBRAL ARTERY:  Antegrade flow direction is identified.   LEFT CAROTID ARTERY: Flow is demonstrated throughout the left carotid bulb and bifurcation on color  flow Doppler imaging. No significant stenosis is identified.   LEFT VERTEBRAL ARTERY:  Antegrade flow direction is identified.   IMPRESSION: No evidence of hemodynamically significant stenosis of either right or left internal carotid artery.  Recent Labs: 12/08/2020: B Natriuretic Peptide 482.0 04/22/2021: TSH 3.772 06/23/2021: ALT 9; Magnesium 1.9 06/27/2021: BUN 54; Creatinine, Ser 9.95; Hemoglobin 11.0; Platelets 134; Potassium 4.3; Sodium 134  Recent Lipid Panel    Component Value Date/Time   CHOL 132 04/22/2021 0515   TRIG 74 04/22/2021 0515   HDL 48 04/22/2021 0515   CHOLHDL 2.8 04/22/2021 0515   VLDL 15 04/22/2021 0515   LDLCALC 69 04/22/2021 0515   LDLCALC 102 (H) 04/25/2019 1159     Risk Assessment/Calculations:   {Does this patient have ATRIAL FIBRILLATION?:[phone]}       Physical Exam:    VS:  There were no vitals taken for this visit.    Wt Readings from Last 3 Encounters:  07/22/21 193 lb (87.5 kg)  07/08/21 193 lb (87.5 kg)  06/27/21 216 lb 0.8 oz (98 kg)     GEN: *** Well nourished, well developed in no acute distress HEENT: Normal NECK: No JVD; No carotid  bruits CARDIAC: ***RRR, no murmurs, rubs, gallops RESPIRATORY:  Clear to auscultation without rales, wheezing or rhonchi  ABDOMEN: Soft, non-tender, non-distended MUSCULOSKELETAL:  No edema; No deformity. *** pedal pulses, ***bilaterally SKIN: Warm and dry NEUROLOGIC:  Alert and oriented x 3 PSYCHIATRIC:  Normal affect   EKG:  EKG is *** ordered today.  The ekg ordered today demonstrates ***  Diagnoses:    No diagnosis found. Assessment and Plan:     Preoperative cardiac evaluation: Atrial fibrillation on chronic anticoagulation: CAD Hypertension: ESRD  {Are you ordering a CV Procedure (e.g. stress test, cath, DCCV, TEE, etc)?   Press F2        :[id number]}    Medication Adjustments/Labs and Tests Ordered: Current medicines are reviewed at length with the patient today.  Concerns regarding medicines are outlined above.  No orders of the defined types were placed in this encounter.  No orders of the defined types were placed in this encounter.   There are no Patient Instructions on file for this visit.   Signed, Emmaline Life, NP  07/26/2021 7:43 AM    [place]

## 2021-07-26 NOTE — ED Notes (Signed)
Pt called out for a nurse, this nurse and 2 techs reported to the pts room to find pt shaking but talking and saying "it hurts". Gwyndolyn Saxon, [place] reported to bedside. Pt lost consciousness, PA confirmed pt was not breathing on his own, chest compressions started, code blue called. Suction began, crash cart and zoll to bed side.

## 2021-07-26 NOTE — Assessment & Plan Note (Signed)
BP was elevated at admission.  Plan Continue home regimen.

## 2021-07-26 NOTE — Progress Notes (Signed)
Asked to assess for dialysis. Pt presented after a seizure at home (hx seizures in the past, not on medication). Is on HD w/ [facility] and missed HD yesterday. After being in ED a few hrs, pt started to shake and then passed out. Per the PA I spoke with he almost coded. Then he became a bit combative and confused. BP's high 216/ 112, HR 70-80, RR 17, on RA at 95% , then NRB mask and now 4 L [place]. K+ high at 6.7, no new eKG changes (QRS 120 msec which is his baseline). CXR w/ mild pulm edema. BUN 79 and creat 11. Our plan is for HD tonight at [facility] for vol overload and hyperkalemia. Will see for formal consultation on Monday.   Kelly Splinter, MD 07/26/2021, 6:10 PM

## 2021-07-26 NOTE — Assessment & Plan Note (Signed)
Currently in sinus rhythm  Plan Continue Eliquis  Tele admit

## 2021-07-26 NOTE — Assessment & Plan Note (Addendum)
Patient with ESRD on HD MWF but missed HD Friday. Vascular access right arm removed due to steal syndrome leading to ring finger amputation. Has had temporary chest catheter for 3 months. Is scheduled for access creation left UE for Thursday, 07/30/21. He presents with seizures and hyperkalemia. He had HD in ED managed by Dr. Jonnie Finner  Plan Per nephrology as to next HD

## 2021-07-26 NOTE — ED Triage Notes (Signed)
Patient's wife in room. Per wife patient gets dialysis on Tuesdays, Thursdays, and Saturdays-missed dialysis yesterday.

## 2021-07-26 NOTE — Assessment & Plan Note (Signed)
Patient had exoneration OS. Had orbital implant for prothesis. This was removed 07/24/21.  Plan Continue ophthalmic medications

## 2021-07-26 NOTE — ED Notes (Signed)
Dialysis Nurse Levada Dy RN at bedside

## 2021-07-26 NOTE — Assessment & Plan Note (Signed)
Per ESRD above - patient had HD  Plan F/u Bmet 2 hours after HD and daily

## 2021-07-26 NOTE — Assessment & Plan Note (Signed)
-   Continue home meds °

## 2021-07-26 NOTE — ED Notes (Signed)
Patient transported to CT 

## 2021-07-26 NOTE — ED Notes (Signed)
Pt is beginning to wake up from medication, requested water. Water given with ordered medications. Pt alert to name and location, not alert to date. States he just didn't feel like going to dialysis yesterday. Expresses no other needs at this time. Call light in reach, stretcher locked in lowest position, family sitting with patient.

## 2021-07-26 NOTE — ED Notes (Signed)
Hospitalist at bedside 

## 2021-07-26 NOTE — ED Notes (Signed)
This nurse attempted to pull the [place] from the pyxis to learn the medication had to be retrieved from the main pharmacy by the [facility]. AC contacted from charge phone,desk phone, and the secretary phone to request medication with no answer. Request sent via text for the medication.

## 2021-07-26 NOTE — Assessment & Plan Note (Signed)
CXR with mild pulmonary edema. Had missed HD but has subsequently been dialyzed. Last ECHO 04/22/21 with EF 55-60%, Grade I DD  Plan Add BNP to lab  Continue home meds

## 2021-07-26 NOTE — Subjective & Objective (Addendum)
[name] has a complex medical hx including DM with gastroparesis, CKD IV on HD, HTN, CAD s/p MI, PAF, recent h/o CVA x 3 over the months of March thru May 2023, recent surgery for explant of OS implant 07/24/21. 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 He has had recent follow up and was dong better with control of myoclonus by Klonipin. He has had several episodes of full body shaking and falls. On the day of admission his wife reports she witness a an episode of fully body shaking with decreased LOC afterward. EMS was called and on arrival he had another witness episode of full body shaking, he was given benzodiazepine and was "post-ictal." He was brought to [facility] for further evaluation.

## 2021-07-26 NOTE — ED Notes (Signed)
Pt combative and trying to remove his leads. Pt is confused and trying to get out of bed. Staff is redirecting pt.

## 2021-07-27 ENCOUNTER — Inpatient Hospital Stay
Admission: RE | Admit: 2021-07-27 | Discharge: 2021-07-27 | Disposition: A | Discharge status: Home / Self Care | Payer: Medicare HMO | Source: Ambulatory Visit

## 2021-07-27 ENCOUNTER — Inpatient Hospital Stay (HOSPITAL_COMMUNITY): Payer: Medicare HMO

## 2021-07-27 ENCOUNTER — Ambulatory Visit (HOSPITAL_COMMUNITY): Payer: Medicare HMO | Attending: Emergency Medicine

## 2021-07-27 ENCOUNTER — Ambulatory Visit: Payer: Medicare HMO | Admitting: Nurse Practitioner

## 2021-07-27 ENCOUNTER — Telehealth (INDEPENDENT_AMBULATORY_CARE_PROVIDER_SITE_OTHER): Payer: Self-pay

## 2021-07-27 DIAGNOSIS — R569 Unspecified convulsions: Secondary | ICD-10-CM | POA: Diagnosis not present

## 2021-07-27 LAB — RENAL FUNCTION PANEL
Albumin: 3 g/dL — ABNORMAL LOW (ref 3.5–5.0)
Anion gap: 13 (ref 5–15)
BUN: 49 mg/dL — ABNORMAL HIGH (ref 6–20)
CO2: 24 mmol/L (ref 22–32)
Calcium: 8.4 mg/dL — ABNORMAL LOW (ref 8.9–10.3)
Chloride: 100 mmol/L (ref 98–111)
Creatinine, Ser: 7.28 mg/dL — ABNORMAL HIGH (ref 0.61–1.24)
GFR, Estimated: 8 mL/min — ABNORMAL LOW (ref >60)
Glucose, Bld: 73 mg/dL (ref 70–99)
Phosphorus: 7.5 mg/dL — ABNORMAL HIGH (ref 2.5–4.6)
Potassium: 3.9 mmol/L (ref 3.5–5.1)
Sodium: 137 mmol/L (ref 135–145)

## 2021-07-27 LAB — GLUCOSE, CAPILLARY
Glucose-Capillary: 81 mg/dL (ref 70–99)
Glucose-Capillary: 80 mg/dL (ref 70–99)
Glucose-Capillary: 88 mg/dL (ref 70–99)
Glucose-Capillary: 87 mg/dL (ref 70–99)

## 2021-07-27 LAB — CK: Total CK: 196 U/L (ref 49–397)

## 2021-07-27 LAB — CBC
HCT: 33.8 % — ABNORMAL LOW (ref 39.0–52.0)
Hemoglobin: 10.4 g/dL — ABNORMAL LOW (ref 13.0–17.0)
MCH: 22 pg — ABNORMAL LOW (ref 26.0–34.0)
MCHC: 30.8 g/dL (ref 30.0–36.0)
MCV: 71.5 fL — ABNORMAL LOW (ref 80.0–100.0)
Platelets: 152 10*3/uL (ref 150–400)
RBC: 4.73 MIL/uL (ref 4.22–5.81)
RDW: 22.3 % — ABNORMAL HIGH (ref 11.5–15.5)
WBC: 16.9 10*3/uL — ABNORMAL HIGH (ref 4.0–10.5)
nRBC: 0 % (ref 0.0–0.2)

## 2021-07-27 LAB — HEPATITIS B SURFACE ANTIGEN: Hepatitis B Surface Ag: NONREACTIVE

## 2021-07-27 LAB — HEPATITIS B SURFACE ANTIBODY,QUALITATIVE: Hep B S Ab: REACTIVE — AB

## 2021-07-27 LAB — HEPATITIS B CORE ANTIBODY, TOTAL: Hep B Core Total Ab: NONREACTIVE

## 2021-07-27 LAB — HEPATITIS C ANTIBODY: HCV Ab: NONREACTIVE

## 2021-07-27 MED ORDER — ORAL CARE MOUTH RINSE
15.0000 mL | Freq: Two times a day (BID) | OROMUCOSAL | Status: DC
  Administered 2021-07-27 – 2021-07-30 (×6): 15 mL via OROMUCOSAL

## 2021-07-27 MED ORDER — SODIUM CHLORIDE 0.45 % IV SOLN: INTRAVENOUS | Status: DC

## 2021-07-27 MED ORDER — KETOROLAC TROMETHAMINE 30 MG/ML IJ SOLN
30.0000 mg | Freq: Four times a day (QID) | INTRAMUSCULAR | Status: AC
Start: 2021-07-27 — End: 2021-07-27
  Administered 2021-07-27 (×2): 30 mg via INTRAVENOUS
  Filled 2021-07-27 (×2): qty 1

## 2021-07-27 NOTE — Progress Notes (Signed)
0615: Contacted patient's wife to notify her of room #. Patient had advised her that "he died" while in the ED. Patient's wife was not aware of this event. Gave update that he went through dialysis overnight and was admitted to floor around 0300.

## 2021-07-27 NOTE — Progress Notes (Signed)
PROGRESS NOTE     [name], is a 51 y.o. male, DOB - October 13, 1970, YWV:[id number]  Admit date - 07/26/2021   Admitting Physician Neena Rhymes, MD  Outpatient Primary MD for the patient is [name], MD  LOS - 1  Chief Complaint  Patient presents with   Seizures        Brief Narrative:  [name]. 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 Hortense Ramal appreciated -Recommend transfer to [facility] neuro unit for continuous EEG -Continue Depakote and clonazepam-  Hyperkalemia Resolved after hemodialysis overnight  ESRD (end stage renal disease) ([place]) Patient with ESRD on HD MWF but missed HD on 07/24/2021.  -Vascular access right arm removed due to steal syndrome leading to ring finger amputation. - -Currently getting HD to left IJ catheter -He is currently  scheduled for access creation left UE for Thursday, 07/30/21.  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 Had orbital implant for prothesis. - This was removed 07/24/21-stitches intact -Continue eyedrops  Benign prostatic hyperplasia with lower urinary tract symptoms PTA was on Flomax  Chronic Grade 2 diastolic heart failure/EF 55 to 60% On admission CXR with mild pulmonary edema. Had missed HD but has subsequently been dialyzed. Last ECHO 04/22/21 with EF 55-60%, Grade I DD -Continue Coreg -Continue to use hemodialysis to address volume status -No hypoxia at this time remains on room air  Disposition/Need for in-Hospital Stay- patient unable to be discharged at this time due to concerns for recurrent seizures requiring continuous EEG monitoring -Transferred to [facility] neuro unit for continuous EEG monitoring  Status is: Inpatient   Disposition: The patient is from: Home              Anticipated d/c is to: Home              Anticipated d/c date is: 2 days              Patient currently is not medically stable to d/c. Barriers: Not Clinically Stable-   Code Status :  -  Code Status: Full Code   Family Communication:   (patient is alert, awake and coherent)  Discussed with wife at bedside  DVT Prophylaxis  :   - SCDs  apixaban (ELIQUIS) tablet 2.5 mg Start: 07/26/21 2245 apixaban (ELIQUIS) tablet 2.5 mg   Lab Results  Component Value Date   PLT 152 07/27/2021    Inpatient Medications  Scheduled Meds:  amiodarone  200 mg Oral Daily   apixaban  2.5  mg Oral BID   atorvastatin  80 mg Oral Daily   calcium acetate  1,334 mg Oral TID WC   carvedilol  25 mg Oral BID   Chlorhexidine Gluconate Cloth  6 each Topical Q0600   clonazePAM  0.5 mg Oral BID   clopidogrel  75 mg Oral Daily   cyclobenzaprine  10 mg Oral TID   divalproex  500 mg Oral Q8H   dorzolamide-timolol  1 drop Right Eye BID   erythromycin   Right Eye QHS   insulin aspart  0-20 Units  Subcutaneous TID WC   mouth rinse  15 mL Mouth Rinse BID   neomycin-polymyxin b-dexamethasone  1 application. Left Eye QHS   tamsulosin  0.4 mg Oral Daily   Continuous Infusions: PRN Meds:.acetaminophen, cloNIDine, heparin, heparin, LORazepam, nitroGLYCERIN   Anti-infectives (From admission, onward)    None         Subjective: [name] Niece today has no fevers, no emesis,  No chest pain,   - Wife at bedside, questions answered -Patient alert and coherent  Objective: Vitals:   07/27/21 0310 07/27/21 0800 07/27/21 1000 07/27/21 1044  BP: 129/82   137/80  Pulse: 88   78  Resp: 18   15  Temp: 99.4 F (37.4 C)   98.9 F (37.2 C)  TempSrc:      SpO2: 100% 98% 96% 100%  Weight: 89.3 kg     Height:        Intake/Output Summary (Last 24 hours) at 07/27/2021 1209 Last data filed at 07/27/2021 0800 Gross per 24 hour  Intake 150 ml  Output 2900 ml  Net -2750 ml   Filed Weights   07/26/21 1113 07/26/21 2030 07/27/21 0310  Weight: 94.3 kg 94.3 kg 89.3 kg    Physical Exam  Gen:- Awake Alert, in no acute distress HEENT:- [place].AT, No sclera icterus, status post left eye enucleation, stitches in situ Neck-Supple Neck,No JVD,.  Left IJ HD catheter Lungs-  fair symmetrical air movement, no wheezing or rales CV- S1, S2 normal, regular  Abd-  +ve B.Sounds, Abd Soft, No tenderness,    Psych-affect is appropriate, oriented x3 Neuro-generalized weakness, no new focal deficits, no tremors MSK-right arm with nonfunctional AV fistula  Data Reviewed: I have personally reviewed following labs and imaging studies  CBC: Recent Labs  Lab 07/26/21 1415 07/27/21 0436  WBC 8.4 16.9*  NEUTROABS 5.5  --   HGB 10.0* 10.4*  HCT 33.7* 33.8*  MCV 73.1* 71.5*  PLT 153 774   Basic Metabolic Panel: Recent Labs  Lab 07/26/21 1415 07/26/21 1638 07/26/21 1934 07/26/21 2358 07/27/21 0436  NA 139  --   --   --  137  K 6.5* 6.3* 6.0* 6.0* 3.9  CL 108  --   --   --  100  CO2 18*  --    --   --  24  GLUCOSE 70  --   --   --  73  BUN 79*  --   --   --  49*  CREATININE 11.07*  --   --   --  7.28*  CALCIUM 8.4*  --   --   --  8.4*  MG 1.9  --   --   --   --   PHOS  --   --   --   --  7.5*   GFR: Estimated Creatinine Clearance: 14 mL/min (A) (by C-G formula based on SCr of 7.28 mg/dL (H)). Liver Function Tests:  Recent Labs  Lab 07/26/21 1415 07/27/21 0436  AST 12*  --   ALT 18  --   ALKPHOS 98  --   BILITOT 0.6  --   PROT 6.5  --   ALBUMIN 3.2* 3.0*   Cardiac Enzymes: Recent Labs  Lab 07/27/21 0436  CKTOTAL 196    Radiology Studies: DG Chest 1 View  Result Date: 07/26/2021 CLINICAL DATA:  Acute mental status change EXAM: CHEST  1 VIEW COMPARISON:  Jun 23, 2021 FINDINGS: Stable cardiomegaly. The hila and mediastinum are unchanged. The left central line is stable. No pneumothorax. Increased interstitial markings in the lungs consistent with mild pulmonary edema. IMPRESSION: Cardiomegaly and mild pulmonary edema. No other acute abnormalities. Electronically Signed   By: Dorise Bullion III M.D.   On: 07/26/2021 13:05   CT Head Wo Contrast  Result Date: 07/26/2021 CLINICAL DATA:  Mental status change, unknown cause EXAM: CT HEAD WITHOUT CONTRAST TECHNIQUE: Contiguous axial images were obtained from the base of the skull through the vertex without intravenous contrast. RADIATION DOSE REDUCTION: This exam was performed according to the departmental dose-optimization program which includes automated exposure control, adjustment of the mA and/or kV according to patient size and/or use of iterative reconstruction technique. COMPARISON:  Same day CT; CT dated Jun 23, 2021 FINDINGS: Brain: No evidence of acute infarction, hemorrhage, hydrocephalus, extra-axial collection or mass lesion/mass effect. Periventricular white matter hypodensities consistent with sequela of chronic microvascular ischemic disease. Vascular: Severe vascular calcifications. Skull: Normal. Negative for  fracture or focal lesion. Sinuses/Orbits: Postsurgical changes are noted in the LEFT orbit with soft tissue air in the curvilinear prosthesis. Other: Similar soft tissue thickening along the posterior scalp. IMPRESSION: No acute intracranial abnormality. Electronically Signed   By: Valentino Saxon M.D.   On: 07/26/2021 18:36   CT Head Wo Contrast  Result Date: 07/26/2021 CLINICAL DATA:  Head trauma, moderate-severe; Neck trauma, dangerous injury mechanism (Age 80-64y) EXAM: CT HEAD WITHOUT CONTRAST CT CERVICAL SPINE WITHOUT CONTRAST TECHNIQUE: Multidetector CT imaging of the head and cervical spine was performed following the standard protocol without intravenous contrast. Multiplanar CT image reconstructions of the cervical spine were also generated. RADIATION DOSE REDUCTION: This exam was performed according to the departmental dose-optimization program which includes automated exposure control, adjustment of the mA and/or kV according to patient size and/or use of iterative reconstruction technique. COMPARISON:  CT dated June 16, 2021 FINDINGS: CT HEAD FINDINGS Brain: No evidence of acute infarction, hemorrhage, hydrocephalus, extra-axial collection or mass lesion/mass effect. Periventricular white matter hypodensities consistent with sequela of chronic microvascular ischemic disease. Vascular: Vascular calcifications. Skull: Normal. Negative for fracture or focal lesion. Sinuses/Orbits: Postsurgical changes within the LEFT orbit with small amount of air and curvilinear likely prosthesis. Other: None CT CERVICAL SPINE FINDINGS Evaluation limited secondary to motion. Alignment: Straightening of the cervical lordosis. No acute spondylolisthesis. Skull base and vertebrae: No definitive acute fracture. Soft tissues and spinal canal: No prevertebral fluid or swelling. No visible canal hematoma. Disc levels: Moderate intervertebral disc space height loss with uncovertebral hypertrophy at C3-4. Mild multilevel  intervertebral disc space height loss of the rest of the cervical spine. Upper chest: Diffuse vascular calcifications. Partial visualization of a LEFT chest CVC. Other: None IMPRESSION: 1.  No acute intracranial abnormality. 2. Postsurgical changes of the LEFT orbit. 3.  No acute fracture or static subluxation of the cervical spine. Electronically Signed   By: Valentino Saxon M.D.   On: 07/26/2021 13:39   CT Cervical Spine Wo Contrast  Result Date: 07/26/2021 CLINICAL DATA:  Head trauma, moderate-severe; Neck trauma, dangerous injury mechanism (Age 39-64y) EXAM: CT HEAD WITHOUT CONTRAST CT CERVICAL SPINE WITHOUT CONTRAST TECHNIQUE: Multidetector CT imaging of the head and cervical spine was performed following the standard protocol without intravenous contrast. Multiplanar CT image reconstructions of the cervical spine were also generated. RADIATION DOSE REDUCTION: This exam was performed according to the departmental dose-optimization program which includes automated exposure control, adjustment of the mA and/or kV according to patient size and/or use of iterative reconstruction technique. COMPARISON:  CT dated June 16, 2021 FINDINGS: CT HEAD FINDINGS Brain: No evidence of acute infarction, hemorrhage, hydrocephalus, extra-axial collection or mass lesion/mass effect. Periventricular white matter hypodensities consistent with sequela of chronic microvascular ischemic disease. Vascular: Vascular calcifications. Skull: Normal. Negative for fracture or focal lesion. Sinuses/Orbits: Postsurgical changes within the LEFT orbit with small amount of air and curvilinear likely prosthesis. Other: None CT CERVICAL SPINE FINDINGS Evaluation limited secondary to motion. Alignment: Straightening of the cervical lordosis. No acute spondylolisthesis. Skull base and vertebrae: No definitive acute fracture. Soft tissues and spinal canal: No prevertebral fluid or swelling. No visible canal hematoma. Disc levels: Moderate  intervertebral disc space height loss with uncovertebral hypertrophy at C3-4. Mild multilevel intervertebral disc space height loss of the rest of the cervical spine. Upper chest: Diffuse vascular calcifications. Partial visualization of a LEFT chest CVC. Other: None IMPRESSION: 1.  No acute intracranial abnormality. 2. Postsurgical changes of the LEFT orbit. 3.  No acute fracture or static subluxation of the cervical spine. Electronically Signed   By: Valentino Saxon M.D.   On: 07/26/2021 13:39   Portable Chest xray  Result Date: 07/27/2021 CLINICAL DATA:  CHF. EXAM: PORTABLE CHEST 1 VIEW COMPARISON:  07/26/2021 FINDINGS: Left chest wall dialysis catheter is identified with tips in the cavoatrial junction. Stable cardiac enlargement. No signs of pleural effusion or edema. No airspace opacities. IMPRESSION: 1. Stable cardiac enlargement. 2. No heart failure. Electronically Signed   By: Kerby Moors M.D.   On: 07/27/2021 05:37     Scheduled Meds:  amiodarone  200 mg Oral Daily   apixaban  2.5 mg Oral BID   atorvastatin  80 mg Oral Daily   calcium acetate  1,334 mg Oral TID WC   carvedilol  25 mg Oral BID   Chlorhexidine Gluconate Cloth  6 each Topical Q0600   clonazePAM  0.5 mg Oral BID   clopidogrel  75 mg Oral Daily   cyclobenzaprine  10 mg Oral TID   divalproex  500 mg Oral Q8H   dorzolamide-timolol  1 drop Right Eye BID   erythromycin   Right Eye QHS   insulin aspart  0-20 Units Subcutaneous TID WC   mouth rinse  15 mL Mouth Rinse BID   neomycin-polymyxin b-dexamethasone  1 application. Left Eye QHS   tamsulosin  0.4 mg Oral Daily   Continuous Infusions:   LOS: 1 day   Roxan Hockey M.D on 07/27/2021 at 12:09 PM  Go to [email or web address] - for contact info  [facility] - Office  [phone]  If 7PM-7AM, please contact night-coverage [email or web address] Password [facility] 07/27/2021, 12:09 PM

## 2021-07-27 NOTE — Telephone Encounter (Signed)
Pre-admitt called informing patient is currently inpatient at [facility] and surgery will need to be cancel for 07/29/21

## 2021-07-27 NOTE — TOC Initial Note (Signed)
Transition of Care (TOC) - Initial/Assessment Note    Patient Details  Name: [name]. MRN: [id number] Date of Birth: 17-Jan-1971  Transition of Care [facility]) CM/SW Contact:    Shade Flood, LCSW Phone Number: 07/27/2021, 11:50 AM  Clinical Narrative:                  Pt admitted from home. He has a high readmission risk score. Met with pt and his wife at bedside to assess. Pt states he lives in [place] with his wife. He plans to return home at dc. Pt has a cane and a walker for DME. He reports that he is independent in ADLs at home. Pt states he is able to obtain medications as needed. Per pt, he does sometimes have difficulty with transportation to appointments. Per pt, he has appointments in several different cities including, [place], [place], and [place]. Pt states that he does not always have someone available to transport him. Pt has Medicare but not Medicaid. Pt would have to private pay for any medical transport.  Pt and his wife state that pt will be transferring to [facility] for continuous EEG study. [facility] will follow and assist if dc needs arise.  Expected Discharge Plan: [place] Barriers to Discharge: Continued Medical Work up   Patient Goals and CMS Choice Patient states their goals for this hospitalization and ongoing recovery are:: get better      Expected Discharge Plan and Services Expected Discharge Plan: [place] In-house Referral: Clinical Social Work     Living arrangements for the past 2 months: Single Family Home                                      Prior Living Arrangements/Services Living arrangements for the past 2 months: Single Family Home Lives with:: Spouse Patient language and need for interpreter reviewed:: Yes Do you feel safe going back to the place where you live?: Yes      Need for Family Participation in Patient Care: No (Comment)   Current home services: DME Criminal  Activity/Legal Involvement Pertinent to Current Situation/Hospitalization: No - Comment as needed  Activities of Daily Living Home Assistive Devices/Equipment: Cane (specify quad or straight), Walker (specify type) ADL Screening (condition at time of admission) Patient's cognitive ability adequate to safely complete daily activities?: Yes Is the patient deaf or have difficulty hearing?: No Does the patient have difficulty seeing, even when wearing glasses/contacts?: No Does the patient have difficulty concentrating, remembering, or making decisions?: No Patient able to express need for assistance with ADLs?: Yes Does the patient have difficulty dressing or bathing?: No Independently performs ADLs?: Yes (appropriate for developmental age) Does the patient have difficulty walking or climbing stairs?: Yes Weakness of Legs: Left Weakness of Arms/Hands: None  Permission Sought/Granted                  Emotional Assessment Appearance:: Appears stated age Attitude/Demeanor/Rapport: Engaged Affect (typically observed): Pleasant Orientation: : Oriented to Self, Oriented to Place, Oriented to  Time, Oriented to Situation Alcohol / Substance Use: Not Applicable Psych Involvement: No (comment)  Admission diagnosis:  Hyperkalemia [E87.5] Seizure [place]) [R56.9] Patient Active Problem List   Diagnosis Date Noted   Seizure ([place]) 07/26/2021   Seasonal allergic rhinitis 07/08/2021   Immunization due 07/08/2021   Hyperglycemia due to type 2 diabetes mellitus ([place]) 07/08/2021  Diabetic renal disease ([place]) 07/08/2021   Aspiration pneumonia ([place]) 06/27/2021   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 difficile colitis 05/05/2021   Cerebral ischemic stroke due to global hypoperfusion with watershed infarct [facility]) 04/24/2021   Peritonitis ([place]) 04/22/2021   Hypertensive crisis 04/21/2021   Acute Bil  CVAs of Bil Frontal amd Rt Parietal  in Watershed [place] 04/21/2021   Exposure of orbital implant  03/16/2021   CVA (cerebral vascular accident) ([place]) 02/13/2021   Steal syndrome as complication of dialysis access ([place]) 02/06/2021   Folate deficiency 02/06/2021   Hyperkalemia 02/05/2021   Hyperphosphatemia 02/05/2021   Hypermagnesemia 02/05/2021   Coronary artery disease 02/05/2021   Hypernatremia 02/05/2021   Osteomyelitis ([place]) 02/05/2021   Wound infection 02/04/2021   Infected wound 01/29/2021   Wound dehiscence, surgical, initial encounter 01/29/2021   ESRD on hemodialysis (HCC)    Prolonged Q-T interval on ECG    Anemia due to chronic kidney disease, on chronic dialysis ([place])    Atrial fibrillation with RVR ([place]) 12/08/2020   Finger amputation, 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 96/28/3662   Atrial fibrillation with rapid ventricular response ([place])    Gangrene of finger of right hand ([place]) 11/25/2020   Postural dizziness with presyncope 11/24/2020   Elevated troponin 11/24/2020   Left leg cellulitis 11/24/2020   Frequent falls 11/24/2020   Osteomyelitis of finger of right hand ([place]) 11/24/2020   Paroxysmal atrial fibrillation ([place]) 11/24/2020   Hypotension 11/24/2020   NSTEMI (non-ST elevated myocardial infarction) ([place]) 11/24/2020   Abnormal gait 08/19/2020   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[name]) 08/19/2020   Anxiety state 08/19/2020   Callosity 08/19/2020   ED (erectile dysfunction) of organic origin 08/19/2020   Heart failure ([place]) 08/19/2020   Long term (current) use of insulin ([place]) 08/19/2020   Myalgia 08/19/2020   Obstructive uropathy 08/19/2020   Reactive depression 08/19/2020   CAD/ H/o prior STEMI 02/2020 s/p DES to LAD 08/19/2020   ESRD (end stage renal disease) ([place]) 08/19/2020   Blindness of left eye 08/15/2020   Combined forms of age-related cataract of right eye 04/17/2020   Supraventricular tachycardia ([place]) 02/26/2020   Acute anterior wall MI ([place])    Benign prostatic hyperplasia with lower urinary tract symptoms 11/07/2019   Fluid overload, unspecified 08/15/2019    Hypertension secondary to other renal disorders 07/20/2019   NVG (neovascular glaucoma), left, 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   Anemia 06/05/2019   Aftercare including intermittent dialysis ([place]) 05/15/2019   Moderate protein-calorie malnutrition ([place]) 05/14/2019   Anaphylactic reaction due to adverse effect of correct drug or medicament properly administered, initial encounter 05/08/2019   Chronic anemia 05/07/2019   Chest pain, unspecified 05/07/2019   Coagulation defect, unspecified ([place]) 05/07/2019   Disorder of phosphorus metabolism, unspecified 05/07/2019   Encounter for fitting and adjustment of extracorporeal dialysis catheter ([place]) 05/07/2019   Iron deficiency anemia, unspecified 05/07/2019   Pain, unspecified 05/07/2019   Pruritus, unspecified 05/07/2019   Dizziness 94/76/5465   Metabolic acidosis 03/54/6568   Syncope 04/06/2019   Urinary retention 04/06/2019   Hypertensive chronic kidney disease with stage 1 through stage 4 chronic kidney disease, or unspecified chronic kidney disease 11/28/2018   Edema 11/28/2018   Hypokalemia 11/28/2018   Hypo-osmolality and hyponatremia 11/28/2018   Proteinuria 11/28/2018   Secondary hyperparathyroidism of renal origin ([place]) 11/28/2018   Lymphedema 01/09/2018   Chronic Grade 2 diastolic heart failure/EF 55 to 60% 12/13/2017   Vomiting 11/28/2017   Acute on chronic heart failure ([place]) 11/23/2017   Diabetic retinopathy ([place])  07/05/2017   Uncontrolled type 2 diabetes mellitus with hyperglycemia, with long-term current use of insulin ([place]) 04/20/2016   Osteomyelitis of toe of left foot ([place]) 03/15/2016   Acute renal failure with tubular necrosis ([place]) 06/23/2015   Diarrhea 06/23/2015   Mixed hyperlipidemia    Essential hypertension, benign    Heart palpitations 05/06/2015   Sepsis ([place]) 03/07/2015   PCP:  [name], MD Pharmacy:   [place], [place] - [address] [place]  [place] [address] Phone: [phone] Fax: [phone]  [id number] - [place], [address] [place] [address] Phone: [phone] Fax: [phone]     Social Determinants of Health (SDOH) Interventions    Readmission Risk Interventions    07/27/2021   11:49 AM 11/27/2020    3:06 PM 02/28/2020    1:31 PM  Readmission Risk Prevention Plan  Transportation Screening Complete Complete Complete  PCP or Specialist Appt within 3-5 Days  Complete Complete  HRI or [place]  Complete Complete  Social Work Consult for [place] Planning/Counseling  Complete Complete  Palliative Care Screening  Not Applicable Not Applicable  Medication Review Press photographer) Complete Complete Complete  SW Recovery Care/Counseling Consult Complete    Palliative Care Screening Not [place] Not Applicable

## 2021-07-27 NOTE — Consult Note (Signed)
[name]. Admit Date: 07/26/2021 07/27/2021 [name] Requesting Physician:  Denton Brick MD  Reason for Consult:  ESRD comanagement HPI:  52M ESRD [facility]   via [facility] presented yesterday to the ED after having a seizure-like episode.  He has a history of recent CVA and had been diagnosed with myoclonus but had a more severe episode prompting presentation.  In the ED he also had a transient apneic and pulseless episode requiring brief CPR before ROSC.  He was admitted and per the patient and wife neurology has come by this morning with plan to initiate transfer to [facility] for further evaluation.  PMH also includes DM2, hypertension, CAD, history of ischemic CVAs.  Nephrology was notified yesterday at the time of patient's presentation because the patient had hypoxia and hyperkalemia.  Imaging with some pulmonary edema.  He received dialysis overnight, first requiring a tPA dwell because of a nonfunctioning TDC.  He eventually had 2.9 L of ultrafiltration.  This morning his potassium has improved to 3.9, he is on nasal cannula.  This morning he has no specific complaints.  Using care everywhere it appears that his HD prescription is as follows: 400 / 800, 3.5h, TDC, TIW, ? EDW, ? K and Ca bath.  ] Balance of 12 systems is negative w/ exceptions as above  PMH  Past Medical History:  Diagnosis Date   Anxiety    Cellulitis    CHF (congestive heart failure) (HCC)    Chronic diarrhea    Chronic kidney disease    Chronic pain of both ankles    Coronary artery disease    Diabetes mellitus without complication ([place])    Diabetes mellitus, type II ([place])    Diabetic neuropathy ([place])    Dysrhythmia    Esophagitis    Frequent falls    Gait instability    Gastritis    GERD (gastroesophageal reflux disease)    H/O enucleation of left eyeball    Heart palpitations    Hemodialysis patient ([place])    History of kidney stones    HLD (hyperlipidemia)    Hypertension    Insomnia     Myocardial infarction ([place])    Nausea and vomiting in adult    recurrent   Stroke ([place])    [place]  Past Surgical History:  Procedure Laterality Date   AMPUTATION Left 03/16/2016   Procedure: AMPUTATION DIGIT LEFT HALLUX;  Surgeon: Trula Slade, DPM;  Location: [place];  Service: Podiatry;  Laterality: Left;  can start around 5    AMPUTATION Right 02/09/2021   Procedure: AMPUTATION DIGIT;  Surgeon: Sherilyn Cooter, MD;  Location: [name];  Service: Orthopedics;  Laterality: Right;   ARTHROSCOPIC REPAIR ACL Left    AV FISTULA PLACEMENT Right 05/31/2019   Procedure: Brachiocephalic AV fistula creation;  Surgeon: Algernon Huxley, MD;  Location: [name] ORS;  Service: Vascular;  Laterality: Right;   COLONOSCOPY WITH PROPOFOL N/A 10/28/2015   Procedure: COLONOSCOPY WITH PROPOFOL;  Surgeon: Lollie Sails, MD;  Location: [facility] ENDOSCOPY;  Service: Endoscopy;  Laterality: N/A;   COLONOSCOPY WITH PROPOFOL N/A 10/29/2015   Procedure: COLONOSCOPY WITH PROPOFOL;  Surgeon: Lollie Sails, MD;  Location: [facility] ENDOSCOPY;  Service: Endoscopy;  Laterality: N/A;   CORONARY/GRAFT ACUTE MI REVASCULARIZATION N/A 02/26/2020   Procedure: Coronary/Graft Acute MI Revascularization;  Surgeon: Jettie Booze, MD;  Location: [place] CV LAB;  Service: Cardiovascular;  Laterality: N/A;   DIALYSIS/PERMA CATHETER INSERTION Right 04/26/2019   [facility]  DIALYSIS/PERMA CATHETER INSERTION N/A 04/26/2019   Procedure: DIALYSIS/PERMA CATHETER INSERTION;  Surgeon: Algernon Huxley, MD;  Location: [place] CV LAB;  Service: Cardiovascular;  Laterality: N/A;   DIALYSIS/PERMA CATHETER INSERTION N/A 05/25/2021   Procedure: DIALYSIS/PERMA CATHETER INSERTION;  Surgeon: Algernon Huxley, MD;  Location: [place] CV LAB;  Service: Cardiovascular;  Laterality: N/A;   DIALYSIS/PERMA CATHETER REMOVAL N/A 08/15/2019   Procedure: DIALYSIS/PERMA CATHETER REMOVAL;  Surgeon: Katha Cabal, MD;  Location: [place] CV LAB;   Service: Cardiovascular;  Laterality: N/A;   EMBOLIZATION Right 02/06/2021   Procedure: EMBOLIZATION;  Surgeon: Algernon Huxley, MD;  Location: [place] CV LAB;  Service: Cardiovascular;  Laterality: Right;  Right Upper Extremity Dialysis Access, Permcath Placement   ESOPHAGOGASTRODUODENOSCOPY (EGD) WITH PROPOFOL N/A 12/27/2017   Procedure: ESOPHAGOGASTRODUODENOSCOPY (EGD) WITH PROPOFOL;  Surgeon: [place], Benay Pike, MD;  Location: [name] ENDOSCOPY;  Service: Gastroenterology;  Laterality: N/A;   EYE SURGERY     INTRAVASCULAR ULTRASOUND/IVUS N/A 02/26/2020   Procedure: Intravascular Ultrasound/IVUS;  Surgeon: Jettie Booze, MD;  Location: [place] CV LAB;  Service: Cardiovascular;  Laterality: N/A;   LEFT HEART CATH AND CORONARY ANGIOGRAPHY N/A 02/26/2020   Procedure: LEFT HEART CATH AND CORONARY ANGIOGRAPHY;  Surgeon: Jettie Booze, MD;  Location: [place] CV LAB;  Service: Cardiovascular;  Laterality: N/A;   PROSTATE SURGERY  2016   TONSILECTOMY/ADENOIDECTOMY WITH MYRINGOTOMY     TONSILLECTOMY     UPPER EXTREMITY ANGIOGRAPHY Right 11/26/2020   Procedure: Upper Extremity Angiography;  Surgeon: Algernon Huxley, MD;  Location: [place] CV LAB;  Service: Cardiovascular;  Laterality: Right;   UPPER EXTREMITY ANGIOGRAPHY Right 02/05/2021   Procedure: UPPER EXTREMITY ANGIOGRAPHY;  Surgeon: Algernon Huxley, MD;  Location: [place] CV LAB;  Service: Cardiovascular;  Laterality: Right;   FH  Family History  Problem Relation Age of Onset   CAD Father    Stroke Father    Diabetes Mellitus II Mother    Kidney failure Mother    Schizophrenia Mother    SH  reports that he has never smoked. He has never used smokeless tobacco. He reports that he does not drink alcohol and does not use drugs. Allergies  Allergies  Allergen Reactions   Levofloxacin Swelling    Facial swelling Other reaction(s): swelling   Promethazine Diarrhea and Other (See Comments)    Muscle cramps    Promethazine Hcl Other (See Comments)    Cramping all over  Other reaction(s): cramp all over   Other     Other reaction(s): Unknown Other reaction(s): Unknown   Malt Other (See Comments)    Cramping   Home medications Prior to Admission medications   Medication Sig Start Date End Date Taking? Authorizing Provider  acetaminophen (TYLENOL) 325 MG tablet Take 1-2 tablets (325-650 mg total) by mouth every 4 (four) hours as needed for mild pain. 05/04/21  Yes Love, Ivan Anchors, PA-C  amiodarone (PACERONE) 200 MG tablet TAKE 1 TABLET BY MOUTH ONCE DAILY. 06/04/21  Yes Kirsteins, Luanna Salk, MD  atorvastatin (LIPITOR) 80 MG tablet Take 1 tablet (80 mg total) by mouth daily. 05/04/21 04/29/22 Yes Love, Ivan Anchors, PA-C  calcium acetate (PHOSLO) 667 MG capsule Take 2 capsules (1,334 mg total) by mouth 3 (three) times daily with meals. 05/04/21  Yes Love, Ivan Anchors, PA-C  carvedilol (COREG) 25 MG tablet Take 25 mg by mouth 2 (two) times daily. 06/16/21  Yes [provider]  clonazePAM (KLONOPIN) 0.5 MG disintegrating tablet  Take by mouth. 06/27/21 07/27/21 Yes [provider]  cloNIDine (CATAPRES) 0.1 MG tablet Take 1 tablet (0.1 mg total) by mouth 3 (three) times daily as needed (systolic blood pressure (top number) is Greater than 200 or DBP (lower number)is Greater than 110). 05/04/21  Yes Love, Ivan Anchors, PA-C  clopidogrel (PLAVIX) 75 MG tablet Take 1 tablet (75 mg total) by mouth daily. 05/04/21  Yes Love, Ivan Anchors, PA-C  cyclobenzaprine (FLEXERIL) 10 MG tablet Take 10 mg by mouth 3 (three) times daily. 06/09/21  Yes [provider]  dorzolamide-timolol (COSOPT) 22.3-6.8 MG/ML ophthalmic solution Place 1 drop into the right eye 2 (two) times daily.   Yes [provider]  ELIQUIS 2.5 MG TABS tablet Take 2.5 mg by mouth 2 (two) times daily. 06/09/21  Yes [provider]  erythromycin ophthalmic ointment SMARTSIG:In Eye(s) 06/19/21  Yes [provider]  hydrocortisone  2.5 % cream Place rectally. 06/04/21  Yes [provider]  neomycin-polymyxin b-dexamethasone (MAXITROL) 3.5-10000-0.1 [place] 1 application. into the left eye in the morning and at bedtime. 06/19/21  Yes [provider]  nitroGLYCERIN (NITROSTAT) 0.4 MG SL tablet Place 1 tablet (0.4 mg total) under the tongue every 5 (five) minutes as needed for chest pain. 12/09/20 12/09/21 Yes Johnson, Clanford L, MD  phenylephrine (,USE FOR PREPARATION-H,) 0.25 % suppository Place 1 suppository rectally 2 (two) times daily as needed for hemorrhoids. 05/04/21  Yes Love, Ivan Anchors, PA-C  tamsulosin (FLOMAX) 0.4 MG CAPS capsule Take 0.4 mg by mouth daily. 05/19/21  Yes [provider]  witch hazel-glycerin (TUCKS) pad Apply topically as needed for itching. 05/04/21  Yes Love, Ivan Anchors, PA-C  clonazePAM (KLONOPIN) 0.5 MG disintegrating tablet Take 1 tablet (0.5 mg total) by mouth 2 (two) times daily. 06/27/21 07/27/21  Wyvonnia Dusky, MD  irbesartan (AVAPRO) 150 MG tablet Take 1 tablet (150 mg total) by mouth daily. Patient not taking: Reported on 07/26/2021 06/27/21 07/27/21  Wyvonnia Dusky, MD  lactobacillus (FLORANEX/LACTINEX) PACK Take 1 packet (1 g total) by mouth 3 (three) times daily with meals. Patient not taking: Reported on 07/26/2021 05/04/21   Love, Ivan Anchors, PA-C  melatonin 5 MG TABS Take 1 tablet (5 mg total) by mouth at bedtime as needed. Patient not taking: Reported on 07/26/2021 05/04/21   Love, Ivan Anchors, PA-C  prednisoLONE acetate (PRED FORTE) 1 % ophthalmic suspension Place 1 drop into the left eye every 4 (four) hours. Patient not taking: Reported on 07/26/2021 05/04/21   LoveIvan Anchors, PA-C  PREPARATION H 0.25-88.44 % suppository Place rectally. Patient not taking: Reported on 07/22/2021 05/04/21   [provider]    Current Medications Scheduled Meds:  amiodarone  200 mg Oral Daily   apixaban  2.5 mg Oral BID   atorvastatin  80 mg Oral Daily   calcium acetate   1,334 mg Oral TID WC   carvedilol  25 mg Oral BID   Chlorhexidine Gluconate Cloth  6 each Topical Q0600   clonazePAM  0.5 mg Oral BID   clopidogrel  75 mg Oral Daily   cyclobenzaprine  10 mg Oral TID   divalproex  500 mg Oral Q8H   dorzolamide-timolol  1 drop Right Eye BID   erythromycin   Right Eye QHS   insulin aspart  0-20 Units Subcutaneous TID WC   mouth rinse  15 mL Mouth Rinse BID   neomycin-polymyxin b-dexamethasone  1 application. Left Eye QHS   tamsulosin  0.4 mg Oral  Daily   Continuous Infusions:  sodium chloride     PRN Meds:.acetaminophen, cloNIDine, heparin, heparin, LORazepam, nitroGLYCERIN  CBC Recent Labs  Lab 07/26/21 1415 07/27/21 0436  WBC 8.4 16.9*  NEUTROABS 5.5  --   HGB 10.0* 10.4*  HCT 33.7* 33.8*  MCV 73.1* 71.5*  PLT 153 921   Basic Metabolic Panel Recent Labs  Lab 07/26/21 1415 07/26/21 1638 07/26/21 1934 07/26/21 2358 07/27/21 0436  NA 139  --   --   --  137  K 6.5* 6.3* 6.0* 6.0* 3.9  CL 108  --   --   --  100  CO2 18*  --   --   --  24  GLUCOSE 70  --   --   --  73  BUN 79*  --   --   --  49*  CREATININE 11.07*  --   --   --  7.28*  CALCIUM 8.4*  --   --   --  8.4*  PHOS  --   --   --   --  7.5*    Physical Exam   Blood pressure 129/82, pulse 88, temperature 99.4 F (37.4 C), resp. rate 18, height 6\' 3"  (1.905 m), weight 89.3 kg, SpO2 100 %. GEN: NAD ENT: NCAT EYES: Bandage over left eye, recently had surgery CV: Regular, normal S1 and S2 PULM: Normal work of breathing, clear ABD: Soft, nontender SKIN: Exit site of [facility] C/D/I EXT: No significant edema  Assessment 22M ESRD presenting with seizure-like activity, found to have respiratory distress, moderate hyperkalemia, and brief apneic/pulseless episode.  ESRD THS TDC [facility], status post emergent dialysis overnight 6/4-6/5 ? Seizures, neurology following.  Appears will transfer to [facility] for further evaluation Hyperkalemia: Resolved with HD, CTM Pulmonary edema,  hypoxic respiratory failure, stable on nasal cannula, improved. Apenic/Pulselessness: Likely multifactorial, improved this morning Anemia, mild, at target BMD: Hyperphosphatemia, calcium okay, continue calcium acetate with diet  Plan Tentative for dialysis tomorrow, 6/6.  Can be done here or at [facility], no dialysis indicated today. We will follow along  [name]  07/27/2021, 9:24 AM

## 2021-07-27 NOTE — Consult Note (Signed)
I connected with  [name]. on 07/27/21 by a video enabled telemedicine application and verified that I am speaking with the correct person using two identifiers.   I discussed the limitations of evaluation and management by telemedicine. The patient expressed understanding and agreed to proceed.  Location of patient: [facility] Location of physician: [facility]   Neurology Consultation Reason for Consult: Jerking Referring Physician: Dr. Roxan Hockey  CC: Seizure-like activity  History is obtained from: Patient, wife at bedside, chart review  HPI: [name]. is a 51 y.o. male with past medical history of end-stage renal disease on HD (TTS), chronic strokes, hypertension, hyperlipidemia, diabetes, peripheral vascular disease, congestive heart failure, atrial fibrillation on Eliquis who presented with seizure-like activity.  Patient states he does not remember the episodes.  Per wife at bedside, patient missed his dialysis on Saturday.  On Sunday he was noted to have full body jerking, eyes rolled upwards by his wife lasting for few minutes followed by fall and confusion.  She called EMS.  On arrival EMS noted another full body shaking seizure-like episode lasting for about 30 seconds and therefore administered 1.5 mg Versed.  His blood sugar at that time was 77.  He was brought to emergency room where he had another episode of full body shaking, minimally responsive afterwards.  He also had an episode of apnea, pulselessness.  CPR was initiated.  After 1 round of CPR, femoral pulse was palpable.  Team spoke with Dr. Cheral Marker who recommended starting Depakote.  Neurology was consulted this morning for further recommendations.  Patient states he will occasionally have jerking of his arms, both left and right but more predominant on the left side maybe 1 time a week but it never as severe as this current episode.  Patient and wife are not sure if this has any  relation to dialysis.  Per chart review, patient has been seen in the past for jerking episode which was diagnosed as myoclonus and started on Klonopin.  Per family Klonopin has helped.  ROS: All other systems reviewed and negative except as noted in the HPI.   Past Medical History:  Diagnosis Date   Anxiety    Cellulitis    CHF (congestive heart failure) (HCC)    Chronic diarrhea    Chronic kidney disease    Chronic pain of both ankles    Coronary artery disease    Diabetes mellitus without complication ([place])    Diabetes mellitus, type II ([place])    Diabetic neuropathy (HCC)    Dysrhythmia    Esophagitis    Frequent falls    Gait instability    Gastritis    GERD (gastroesophageal reflux disease)    H/O enucleation of left eyeball    Heart palpitations    Hemodialysis patient ([place])    History of kidney stones    HLD (hyperlipidemia)    Hypertension    Insomnia    Myocardial infarction (HCC)    Nausea and vomiting in adult    recurrent   Stroke [facility])     Family History  Problem Relation Age of Onset   CAD Father    Stroke Father    Diabetes Mellitus II Mother    Kidney failure Mother    Schizophrenia Mother     Social History:  reports that he has never smoked. He has never used smokeless tobacco. He reports that he does not drink alcohol and does not use drugs.   Medications  Prior to Admission  Medication Sig Dispense Refill Last Dose   acetaminophen (TYLENOL) 325 MG tablet Take 1-2 tablets (325-650 mg total) by mouth every 4 (four) hours as needed for mild pain.   unknown   amiodarone (PACERONE) 200 MG tablet TAKE 1 TABLET BY MOUTH ONCE DAILY. 30 tablet 0 07/25/2021   atorvastatin (LIPITOR) 80 MG tablet Take 1 tablet (80 mg total) by mouth daily. 30 tablet 0 07/25/2021   calcium acetate (PHOSLO) 667 MG capsule Take 2 capsules (1,334 mg total) by mouth 3 (three) times daily with meals. 180 capsule 0 07/25/2021   carvedilol (COREG) 25 MG tablet Take 25 mg by mouth 2  (two) times daily.   07/25/2021 at unknown   clonazePAM (KLONOPIN) 0.5 MG disintegrating tablet Take by mouth.      cloNIDine (CATAPRES) 0.1 MG tablet Take 1 tablet (0.1 mg total) by mouth 3 (three) times daily as needed (systolic blood pressure (top number) is Greater than 200 or DBP (lower number)is Greater than 110). 30 tablet 0 07/25/2021   clopidogrel (PLAVIX) 75 MG tablet Take 1 tablet (75 mg total) by mouth daily. 30 tablet 0 07/25/2021 at AM   cyclobenzaprine (FLEXERIL) 10 MG tablet Take 10 mg by mouth 3 (three) times daily.   07/25/2021   dorzolamide-timolol (COSOPT) 22.3-6.8 MG/ML ophthalmic solution Place 1 drop into the right eye 2 (two) times daily.   07/25/2021   ELIQUIS 2.5 MG TABS tablet Take 2.5 mg by mouth 2 (two) times daily.   07/25/2021 at PM   erythromycin ophthalmic ointment SMARTSIG:In Eye(s)   07/25/2021   hydrocortisone 2.5 % cream Place rectally.   unknown   neomycin-polymyxin b-dexamethasone (MAXITROL) 3.5-10000-0.1 OINT Place 1 application. into the left eye in the morning and at bedtime.   07/25/2021   nitroGLYCERIN (NITROSTAT) 0.4 MG SL tablet Place 1 tablet (0.4 mg total) under the tongue every 5 (five) minutes as needed for chest pain.   unknown   phenylephrine (,USE FOR PREPARATION-H,) 0.25 % suppository Place 1 suppository rectally 2 (two) times daily as needed for hemorrhoids. 12 suppository 0 unknown   tamsulosin (FLOMAX) 0.4 MG CAPS capsule Take 0.4 mg by mouth daily.   07/25/2021   witch hazel-glycerin (TUCKS) pad Apply topically as needed for itching. 40 each 12 unknown   clonazePAM (KLONOPIN) 0.5 MG disintegrating tablet Take 1 tablet (0.5 mg total) by mouth 2 (two) times daily. 60 tablet 0    irbesartan (AVAPRO) 150 MG tablet Take 1 tablet (150 mg total) by mouth daily. (Patient not taking: Reported on 07/26/2021) 30 tablet 0 Completed Course   lactobacillus (FLORANEX/LACTINEX) PACK Take 1 packet (1 g total) by mouth 3 (three) times daily with meals. (Patient not taking:  Reported on 07/26/2021) 90 packet 0 Not Taking   melatonin 5 MG TABS Take 1 tablet (5 mg total) by mouth at bedtime as needed. (Patient not taking: Reported on 07/26/2021) 30 tablet 0 Not Taking   prednisoLONE acetate (PRED FORTE) 1 % ophthalmic suspension Place 1 drop into the left eye every 4 (four) hours. (Patient not taking: Reported on 07/26/2021) 5 mL 0 Completed Course   PREPARATION H 0.25-88.44 % suppository Place rectally. (Patient not taking: Reported on 07/22/2021)         Exam: Current vital signs: BP 137/80 (BP Location: Left Arm)   Pulse 78   Temp 98.9 F (37.2 C)   Resp 15   Ht 6\' 3"  (1.905 m)   Wt 89.3 kg   SpO2 100%  BMI 24.61 kg/m  Vital signs in last 24 hours: Temp:  [97.4 F (36.3 C)-99.4 F (37.4 C)] 98.9 F (37.2 C) (06/05 1044) Pulse Rate:  [70-108] 78 (06/05 1044) Resp:  [8-109] 15 (06/05 1044) BP: (112-262)/(72-139) 137/80 (06/05 1044) SpO2:  [62 %-100 %] 100 % (06/05 1044) FiO2 (%):  [100 %] 100 % (06/04 1751) Weight:  [89.3 kg-94.3 kg] 89.3 kg (06/05 0310)   Physical Exam  Constitutional: Appears well-developed and well-nourished.  Psych: Affect appropriate to situation Neuro: AOx3, cranial nerves II to XII appear grossly intact except dressing on left eyes unable to examine, no apparent facial asymmetry, antigravity strength in all 4 extremities without drift, FTN intact bilaterally,  I have reviewed labs in epic and the results pertinent to this consultation are: CBC:  Recent Labs  Lab 07/26/21 1415 07/27/21 0436  WBC 8.4 16.9*  NEUTROABS 5.5  --   HGB 10.0* 10.4*  HCT 33.7* 33.8*  MCV 73.1* 71.5*  PLT 153 017    Basic Metabolic Panel:  Lab Results  Component Value Date   NA 137 07/27/2021   K 3.9 07/27/2021   CO2 24 07/27/2021   GLUCOSE 73 07/27/2021   BUN 49 (H) 07/27/2021   CREATININE 7.28 (H) 07/27/2021   CALCIUM 8.4 (L) 07/27/2021   GFRNONAA 8 (L) 07/27/2021   GFRAA 5 (L) 08/27/2019   Lipid Panel:  Lab Results  Component  Value Date   LDLCALC 69 04/22/2021   HgbA1c:  Lab Results  Component Value Date   HGBA1C 4.7 (L) 04/21/2021   Urine Drug Screen:     Component Value Date/Time   LABOPIA NONE DETECTED 10/14/2017 1800   COCAINSCRNUR NONE DETECTED 10/14/2017 1800   COCAINSCRNUR NONE DETECTED 07/18/2017 1652   LABBENZ NONE DETECTED 10/14/2017 1800   AMPHETMU NONE DETECTED 10/14/2017 1800   THCU NONE DETECTED 10/14/2017 1800   LABBARB NONE DETECTED 10/14/2017 1800    Alcohol Level     Component Value Date/Time   ETH <10 07/26/2021 1415     I have reviewed the images obtained:  CT Head without contrast 07/26/2021: No acute intracranial abnormality.  ASSESSMENT/PLAN: 51 year old male with end-stage renal disease: HD, episodes of muscle jerking on Klonopin who presented with seizure-like episodes in the setting of missing dialysis.  Seizure-like episodes End-stage renal disease on HD Hyperkalemia Leukocytosis -Differential for these episodes includes myoclonus in the setting of uremia as well as Flexeril use versus epileptic seizures  Recommendations: -Patient has had previous admissions for similar complaints.  3 prior routine EEGs have been within normal limits.  Therefore recommend transfer to [facility] for long-term EEG monitoring for further classification and characterization of these episodes -In the meantime, continue Klonopin 0.5 mg twice daily -Continue Depakote 500 mg every 8 hours.  Will likely hold once he is transferred to [facility] and on EEG -Discussed plan with patient, wife at bedside as well as Dr. Denton Brick.  Thank you for allowing [place] to participate in the care of this patient. If you have any further questions, please contact  me or neurohospitalist.   Zeb Comfort Epile[facility] neurohospitalist

## 2021-07-27 NOTE — Plan of Care (Signed)
  Problem: Education: Goal: Ability to describe self-care measures that may prevent or decrease complications (Diabetes Survival Skills Education) will improve Outcome: Progressing Goal: Individualized Educational Video(s) Outcome: Progressing   Problem: Coping: Goal: Ability to adjust to condition or change in health will improve Outcome: Progressing   Problem: Fluid Volume: Goal: Ability to maintain a balanced intake and output will improve Outcome: Progressing   Problem: Health Behavior/Discharge Planning: Goal: Ability to identify and utilize available resources and services will improve Outcome: Progressing Goal: Ability to manage health-related needs will improve Outcome: Progressing   Problem: Metabolic: Goal: Ability to maintain appropriate glucose levels will improve Outcome: Progressing   Problem: Nutritional: Goal: Maintenance of adequate nutrition will improve Outcome: Progressing Goal: Progress toward achieving an optimal weight will improve Outcome: Progressing   Problem: Skin Integrity: Goal: Risk for impaired skin integrity will decrease Outcome: Progressing   Problem: Tissue Perfusion: Goal: Adequacy of tissue perfusion will improve Outcome: Progressing   Problem: Education: Goal: Knowledge of General Education information will improve Description: Including pain rating scale, medication(s)/side effects and non-pharmacologic comfort measures Outcome: Progressing   Problem: Health Behavior/Discharge Planning: Goal: Ability to manage health-related needs will improve Outcome: Progressing   Problem: Clinical Measurements: Goal: Ability to maintain clinical measurements within normal limits will improve Outcome: Progressing Goal: Will remain free from infection Outcome: Progressing Goal: Diagnostic test results will improve Outcome: Progressing Goal: Respiratory complications will improve Outcome: Progressing Goal: Cardiovascular complication will  be avoided Outcome: Progressing   Problem: Activity: Goal: Risk for activity intolerance will decrease Outcome: Progressing   Problem: Nutrition: Goal: Adequate nutrition will be maintained Outcome: Progressing   Problem: Coping: Goal: Level of anxiety will decrease Outcome: Progressing   Problem: Elimination: Goal: Will not experience complications related to bowel motility Outcome: Progressing Goal: Will not experience complications related to urinary retention Outcome: Progressing   Problem: Pain Managment: Goal: General experience of comfort will improve Outcome: Progressing   Problem: Safety: Goal: Ability to remain free from injury will improve Outcome: Progressing   Problem: Skin Integrity: Goal: Risk for impaired skin integrity will decrease Outcome: Progressing   Problem: Education: Goal: Expressions of having a comfortable level of knowledge regarding the disease process will increase Outcome: Progressing   Problem: Coping: Goal: Ability to adjust to condition or change in health will improve Outcome: Progressing Goal: Ability to identify appropriate support needs will improve Outcome: Progressing   Problem: Health Behavior/Discharge Planning: Goal: Compliance with prescribed medication regimen will improve Outcome: Progressing   Problem: Medication: Goal: Risk for medication side effects will decrease Outcome: Progressing   Problem: Clinical Measurements: Goal: Complications related to the disease process, condition or treatment will be avoided or minimized Outcome: Progressing Goal: Diagnostic test results will improve Outcome: Progressing   Problem: Safety: Goal: Verbalization of understanding the information provided will improve Outcome: Progressing   Problem: Self-Concept: Goal: Level of anxiety will decrease Outcome: Progressing Goal: Ability to verbalize feelings about condition will improve Outcome: Progressing   

## 2021-07-27 NOTE — Therapy (Incomplete)
OUTPATIENT PHYSICAL THERAPY NEURO EVALUATION   Patient Name: [name]. MRN: [id number] DOB:01/13/1971, 51 y.o., male Today's Date: 07/27/2021   PCP: *** REFERRING PROVIDER: ***    Past Medical History:  Diagnosis Date   Anxiety    Cellulitis    CHF (congestive heart failure) (HCC)    Chronic diarrhea    Chronic kidney disease    Chronic pain of both ankles    Coronary artery disease    Diabetes mellitus without complication ([place])    Diabetes mellitus, type II ([place])    Diabetic neuropathy (HCC)    Dysrhythmia    Esophagitis    Frequent falls    Gait instability    Gastritis    GERD (gastroesophageal reflux disease)    H/O enucleation of left eyeball    Heart palpitations    Hemodialysis patient ([place])    History of kidney stones    HLD (hyperlipidemia)    Hypertension    Insomnia    Myocardial infarction ([place])    Nausea and vomiting in adult    recurrent   Stroke [facility])    Past Surgical History:  Procedure Laterality Date   AMPUTATION Left 03/16/2016   Procedure: AMPUTATION DIGIT LEFT HALLUX;  Surgeon: Trula Slade, DPM;  Location: [place];  Service: Podiatry;  Laterality: Left;  can start around 5    AMPUTATION Right 02/09/2021   Procedure: AMPUTATION DIGIT;  Surgeon: Sherilyn Cooter, MD;  Location: [place];  Service: Orthopedics;  Laterality: Right;   ARTHROSCOPIC REPAIR ACL Left    AV FISTULA PLACEMENT Right 05/31/2019   Procedure: Brachiocephalic AV fistula creation;  Surgeon: Algernon Huxley, MD;  Location: [name] ORS;  Service: Vascular;  Laterality: Right;   COLONOSCOPY WITH PROPOFOL N/A 10/28/2015   Procedure: COLONOSCOPY WITH PROPOFOL;  Surgeon: Lollie Sails, MD;  Location: [facility] ENDOSCOPY;  Service: Endoscopy;  Laterality: N/A;   COLONOSCOPY WITH PROPOFOL N/A 10/29/2015   Procedure: COLONOSCOPY WITH PROPOFOL;  Surgeon: Lollie Sails, MD;  Location[facility] END[facility]COPY;  Service: Endoscopy;  Laterality: N/A;   CORONARY/GRAFT ACUTE MI  REVASCULARIZATION N/A 02/26/2020   Procedure: Coronary/Graft Acute MI Revascularization;  Surgeon: Jettie Booze, MD;  Location: [place] [facility];  Service: Cardiovascular;  Laterality: N/A;   DIALYSIS/PERMA CATHETER INSERTION Right 04/26/2019   Perm Cath    DIALYSIS/PERMA CATHETER INSERTION N/A 04/26/2019   Procedure: DIALYSIS/PERMA CATHETER INSERTION;  Surgeon: Algernon Huxley, MD;  Location: [place] [facility];  Service: Cardiovascular;  Laterality: N/A;   DIALYSIS/PERMA CATHETER INSERTION N/A 05/25/2021   Procedure: DIALYSIS/PERMA CATHETER INSERTION;  Surgeon: Algernon Huxley, MD;  Location: [place] [facility];  Service: Cardiovascular;  Laterality: N/A;   DIALYSIS/PERMA CATHETER REMOVAL N/A 08/15/2019   Procedure: DIALYSIS/PERMA CATHETER REMOVAL;  Surgeon: Katha Cabal, MD;  Location: [place] [facility];  Service: Cardiovascular;  Laterality: N/A;   EMBOLIZATION Right 02/06/2021   Procedure: EMBOLIZATION;  Surgeon: Algernon Huxley, MD;  Location: [place] [facility];  Service: Cardiovascular;  Laterality: Right;  Right Upper Extremity Dialysis Access, Permcath Placement   ESOPHAGOGASTRODUODENOSCOPY (EGD) WITH PROPOFOL N/A 12/27/2017   Procedure: ESOPHAGOGASTRODUODENOSCOPY (EGD) WITH PROPOFOL;  Surgeon: [place], Benay Pike, MD;  Location: [name] ENDOSCOPY;  Service: Gastroenterology;  Laterality: N/A;   EYE SURGERY     INTRAVASCULAR ULTRASOUND/IVUS N/A 02/26/2020   Procedure: Intravascular Ultrasound/IVUS;  Surgeon: Jettie Booze, MD;  Location: [place] [facility];  Service: Cardiovascular;  Laterality: N/A;   LEFT HEART CATH AND CORONARY  ANGIOGRAPHY N/A 02/26/2020   Procedure: LEFT HEART CATH AND CORONARY ANGIOGRAPHY;  Surgeon: Jettie Booze, MD;  Location: [place] [facility];  Service: Cardiovascular;  Laterality: N/A;   PROSTATE SURGERY  2016   TONSILECTOMY/ADENOIDECTOMY WITH MYRINGOTOMY     TONSILLECTOMY     UPPER EXTREMITY ANGIOGRAPHY Right 11/26/2020    Procedure: Upper Extremity Angiography;  Surgeon: Algernon Huxley, MD;  Location: [place] [facility];  Service: Cardiovascular;  Laterality: Right;   UPPER EXTREMITY ANGIOGRAPHY Right 02/05/2021   Procedure: UPPER EXTREMITY ANGIOGRAPHY;  Surgeon: Algernon Huxley, MD;  Location: [place] [facility];  Service: Cardiovascular;  Laterality: Right;   Patient Active Problem List   Diagnosis Date Noted   Seizure ([place]) 07/26/2021   Seasonal allergic rhinitis 07/08/2021   Immunization due 07/08/2021   Hyperglycemia due to type 2 diabetes mellitus ([place]) 07/08/2021   Diabetic renal disease ([place]) 07/08/2021   Aspiration pneumonia ([place]) 06/27/2021   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 difficile colitis 05/05/2021   Cerebral ischemic stroke due to global hypoperfusion with watershed infarct [facility]) 04/24/2021   Peritonitis ([place]) 04/22/2021   Hypertensive crisis 04/21/2021   Acute Bil  CVAs of Bil Frontal amd Rt Parietal  in [employer] 04/21/2021   Exposure of orbital implant 03/16/2021   CVA (cerebral vascular accident) ([place]) 02/13/2021   Steal syndrome as complication of dialysis access ([place]) 02/06/2021   Folate deficiency 02/06/2021   Hyperkalemia 02/05/2021   Hyperphosphatemia 02/05/2021   Hypermagnesemia 02/05/2021   Coronary artery disease 02/05/2021   Hypernatremia 02/05/2021   Osteomyelitis ([place]) 02/05/2021   Wound infection 02/04/2021   Infected wound 01/29/2021   Wound dehiscence, surgical, initial encounter 01/29/2021   ESRD on hemodialysis (HCC)    Prolonged Q-T interval on ECG    Anemia due to chronic kidney disease, on chronic dialysis (HCC)    Atrial fibrillation with RVR ([place]) 12/08/2020   Finger amputation, 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 28/78/6767   Atrial fibrillation with rapid ventricular response (HCC)    Gangrene of finger of right hand ([place]) 11/25/2020   Postural dizziness with presyncope 11/24/2020   Elevated troponin 11/24/2020   Left leg cellulitis 11/24/2020   Frequent falls  11/24/2020   Osteomyelitis of finger of right hand ([place]) 11/24/2020   Paroxysmal atrial fibrillation ([place]) 11/24/2020   Hypotension 11/24/2020   NSTEMI (non-ST elevated myocardial infarction) ([place]) 11/24/2020   Abnormal gait 08/19/2020   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[place]) 08/19/2020   Anxiety state 08/19/2020   Callosity 08/19/2020   ED (erectile dysfunction) of organic origin 08/19/2020   Heart failure ([place]) 08/19/2020   Long term (current) use of insulin ([place]) 08/19/2020   Myalgia 08/19/2020   Obstructive uropathy 08/19/2020   Reactive depression 08/19/2020   CAD/ H/o prior STEMI 02/2020 s/p DES to LAD 08/19/2020   ESRD (end stage renal disease) ([place]) 08/19/2020   Blindness of left eye 08/15/2020   Combined forms of age-related cataract of right eye 04/17/2020   Supraventricular tachycardia ([place]) 02/26/2020   Acute anterior wall MI ([place])    Benign prostatic hyperplasia with lower urinary tract symptoms 11/07/2019   Fluid overload, unspecified 08/15/2019   Hypertension secondary to other renal disorders 07/20/2019   NVG (neovascular glaucoma), left, 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   Anemia 06/05/2019   Aftercare including intermittent dialysis ([place]) 05/15/2019   Moderate protein-calorie malnutrition ([place]) 05/14/2019   Anaphylactic reaction due to adverse effect of correct drug or medicament properly administered, initial encounter 05/08/2019   Chronic anemia 05/07/2019  Chest pain, unspecified 05/07/2019   Coagulation defect, unspecified ([place]) 05/07/2019   Disorder of phosphorus metabolism, unspecified 05/07/2019   Encounter for fitting and adjustment of extracorporeal dialysis catheter ([place]) 05/07/2019   Iron deficiency anemia, unspecified 05/07/2019   Pain, unspecified 05/07/2019   Pruritus, unspecified 05/07/2019   Dizziness 67/61/9509   Metabolic acidosis 32/67/1245   Syncope 04/06/2019   Urinary retention 04/06/2019   Hypertensive chronic kidney disease with stage  1 through stage 4 chronic kidney disease, or unspecified chronic kidney disease 11/28/2018   Edema 11/28/2018   Hypokalemia 11/28/2018   Hypo-osmolality and hyponatremia 11/28/2018   Proteinuria 11/28/2018   Secondary hyperparathyroidism of renal origin ([place]) 11/28/2018   Lymphedema 01/09/2018   Chronic Grade 2 diastolic heart failure/EF 55 to 60% 12/13/2017   Vomiting 11/28/2017   Acute on chronic heart failure ([place]) 11/23/2017   Diabetic retinopathy ([place]) 07/05/2017   Uncontrolled type 2 diabetes mellitus with hyperglycemia, with long-term current use of insulin ([place]) 04/20/2016   Osteomyelitis of toe of left foot ([place]) 03/15/2016   Acute renal failure with tubular necrosis ([place]) 06/23/2015   Diarrhea 06/23/2015   Mixed hyperlipidemia    Essential hypertension, benign    Heart palpitations 05/06/2015   Sepsis ([employer]) 03/07/2015    ONSET DATE: ***  REFERRING DIAG: PT eval/tx for recent CVA w/soft paresis. He has residual bilat. weakness, unsteadiness on his feet, and h/o likely rotator cuff tear in the lt shoulder, would benefit from continued strengthening and rehabilitation per [name], MD   THERAPY DIAG:  No diagnosis found.  Rationale for Evaluation and Treatment {HABREHAB:27488}  SUBJECTIVE:                                                                                                                                                                                              SUBJECTIVE STATEMENT: PT eval/tx for recent CVA w/soft paresis. He has residual bilat. weakness, unsteadiness on his feet, and h/o likely rotator cuff tear in the lt shoulder, would benefit from continued strengthening and rehabilitation per [name], MD  Pt accompanied by: {accompnied:27141}  PERTINENT HISTORY:  1. Multifocal myoclonus per history that has essentially resolved now. Full-body jerking causing fall 06/23/2021 in patient with Chronic Kidney Disease stage IV, diabetes mellitus  type II - patient went to Emergency Room  - Reviewed notes per Emergency Room  - We will order routine, sleep-deprived EEG   2. History of 3 strokes in past 2 months: chronic infarcts of bilateral high frontal lobes and right parietal lobe.   3. Diabetes mellitus Type II diagnosed mid 65s in patient with +ve family history  of diabetes (mother)   4. Gastroparesis due to diabetes mellitus   5. Chronic kidney disease stage 4 - patient on dialysis  - Recommend requesting slow dialysis treatment as patient has history of stroke during treatment   6. Enucleation of left eye   Degenerative disc disease changes at C3-C4. PAIN:  Are you having pain? {OPRCPAIN:27236}  PRECAUTIONS: {Therapy precautions:24002}  WEIGHT BEARING RESTRICTIONS {Yes ***/No:24003}  FALLS: Has patient fallen in last 6 months? {fallsyesno:27318}  LIVING ENVIRONMENT: Lives with: {OPRC lives with:25569::"lives with their family"} Lives in: {Lives in:25570} Stairs: {opstairs:27293} Has following equipment at home: {Assistive devices:23999}  PLOF: {PLOF:24004}  PATIENT GOALS ***  OBJECTIVE:   DIAGNOSTIC FINDINGS:  IMPRESSION: 1. Significantly age advanced cerebral atrophy. Small vessel ischemic change in the periventricular white matter. No acute superimposed process. 2. Persistent soft tissue thickening about the posterior paramidline occiput, possibly chronic hematoma.  FINDINGS: Brain: There are small chronic infarcts of the high frontal lobes bilaterally and right parietal lobe reflecting evolution of infarcts seen on the prior study. No acute infarction. No evidence of hemorrhage. There is no intracranial mass, mass effect, or edema. There is no hydrocephalus or extra-axial fluid collection. Additional patchy T2 hyperintensity in the supratentorial and pontine white matter is nonspecific but may reflect similar chronic microvascular ischemic changes. Small chronic right thalamic infarct. Prominence  of the ventricles and sulci reflects similar minor parenchymal volume loss.  COGNITION: Overall cognitive status: {cognition:24006}   SENSATION: {sensation:27233}  COORDINATION: ***  EDEMA:  {edema:24020}  MUSCLE TONE: {LE tone:25568}   MUSCLE LENGTH: Hamstrings: Right *** deg; Left *** deg Marcello Moores test: Right *** deg; Left *** deg  DTRs:  {DTR SITE:24025}  POSTURE: {posture:25561}  LOWER EXTREMITY ROM:     {AROM/PROM:27142}  Right Eval Left Eval  Hip flexion    Hip extension    Hip abduction    Hip adduction    Hip internal rotation    Hip external rotation    Knee flexion    Knee extension    Ankle dorsiflexion    Ankle plantarflexion    Ankle inversion    Ankle eversion     (Blank rows = not tested)  LOWER EXTREMITY MMT:    MMT Right Eval Left Eval  Hip flexion    Hip extension    Hip abduction    Hip adduction    Hip internal rotation    Hip external rotation    Knee flexion    Knee extension    Ankle dorsiflexion    Ankle plantarflexion    Ankle inversion    Ankle eversion    (Blank rows = not tested)  BED MOBILITY:  {Bed mobility:24027}  TRANSFERS: Assistive device utilized: {Assistive devices:23999}  Sit to stand: {Levels of assistance:24026} Stand to sit: {Levels of assistance:24026} Chair to chair: {Levels of assistance:24026} Floor: {Levels of assistance:24026}  RAMP:  Level of Assistance: {Levels of assistance:24026} Assistive device utilized: {Assistive devices:23999} Ramp Comments: ***  CURB:  Level of Assistance: {Levels of assistance:24026} Assistive device utilized: {Assistive devices:23999} Curb Comments: ***  STAIRS:  Level of Assistance: {Levels of assistance:24026}  Stair Negotiation Technique: {Stair Technique:27161} with {Rail Assistance:27162}  Number of Stairs: ***   Height of Stairs: ***  Comments: ***  GAIT: Gait pattern: {gait characteristics:25376} Distance walked: *** Assistive device utilized:  {Assistive devices:23999} Level of assistance: {Levels of assistance:24026} Comments: ***  FUNCTIONAL TESTs:  {Functional tests:24029}  PATIENT SURVEYS:  {rehab surveys:24030}  TODAY'S TREATMENT:  ***   PATIENT EDUCATION:  Education details: Patient educated on exam findings, POC, scope of PT, HEP, and ***. Person educated: Patient Education method: Explanation, Demonstration, and Handouts Education comprehension: verbalized understanding, returned demonstration, verbal cues required, and tactile cues required    HOME EXERCISE PROGRAM: ***    GOALS: Goals reviewed with patient? {yes/no:20286}  SHORT TERM GOALS: Target date: 08/24/2021  Patient will be independent with HEP in order to improve functional outcomes. Baseline:  Goal status: {GOALSTATUS:25110}  2.  Patient will improve five time sit to stand by *** seconds to improve efficiency and safety with tranfers.  Baseline: *** Goal status: {GOALSTATUS:25110}  3.  Patient will improve TUG by *** seconds to improve safety, efficiency and ability to navigate home and community.  Baseline: *** Goal status: {GOALSTATUS:25110}  4.  Patient will improve *** strength by 1/2 grade to work towards improved performance of transfers, stairs and ambulation.  Baseline: *** Goal status: {GOALSTATUS:25110}  5.  *** Baseline: *** Goal status: {GOALSTATUS:25110}  6.  *** Baseline: *** Goal status: {GOALSTATUS:25110}  LONG TERM GOALS: Target date: {follow up:25551}  Patient will report at least 75% improvement in symptoms for improved quality of life. Baseline: *** Goal status: {GOALSTATUS:25110}  2.  Patient will be able to complete 5x STS in under 11.4 seconds in order to reduce the risk of falls. Baseline: *** Goal status: {GOALSTATUS:25110}  3.  Patient will be able to ambulate at least *** feet in 2MWT in order to demonstrate improved gait speed for community ambulation.  Baseline: *** Goal status:  {GOALSTATUS:25110}  4.  Patient will score at least ***/24 on DGI in order to demonstrate improved balance to reduce the risk for falls at home and in the community.  Baseline: *** Goal status: {GOALSTATUS:25110}  5.  Patient will be able to navigate stairs with reciprocal pattern without compensation in order to demonstrate improved LE strength. Baseline: *** Goal status: {GOALSTATUS:25110}  6.  *** Baseline: *** Goal status: {GOALSTATUS:25110}  ASSESSMENT:  CLINICAL IMPRESSION: Patient a 51 y.o. y.o. male who was seen today for physical therapy evaluation and treatment for history of CVA.     OBJECTIVE IMPAIRMENTS {opptimpairments:25111}.   ACTIVITY LIMITATIONS {activitylimitations:27494}  PARTICIPATION LIMITATIONS: {participationrestrictions:25113}  PERSONAL FACTORS {Personal factors:25162} are also affecting patient's functional outcome.   REHAB POTENTIAL: {rehabpotential:25112}  CLINICAL DECISION MAKING: {clinical decision making:25114}  EVALUATION COMPLEXITY: {Evaluation complexity:25115}  PLAN: PT FREQUENCY: {rehab frequency:25116}  PT DURATION: {rehab duration:25117}  PLANNED INTERVENTIONS: PLANNED INTERVENTIONS: Therapeutic exercises, Therapeutic activity, Neuromuscular re-education, Balance training, Gait training, Patient/Family education, Joint manipulation, Joint mobilization, Stair training, Orthotic/Fit training, DME instructions, Aquatic Therapy, Dry Needling, Electrical stimulation, Spinal manipulation, Spinal mobilization, Cryotherapy, Moist heat, Compression bandaging, scar mobilization, Splintting, Taping, Traction, Ultrasound, Ionotophoresis 4mg /ml Dexamethasone, and Manual therapy   PLAN FOR NEXT SESSION: ***   Ginette Bradway, PT 07/27/2021, 7:05 AM

## 2021-07-27 NOTE — ED Notes (Signed)
Pt oral suctioned during Dialysis due to coughing up thick bloody secretions. Pts airway Patent at this time.

## 2021-07-28 ENCOUNTER — Inpatient Hospital Stay (HOSPITAL_COMMUNITY): Payer: Medicare HMO

## 2021-07-28 DIAGNOSIS — E1122 Type 2 diabetes mellitus with diabetic chronic kidney disease: Secondary | ICD-10-CM

## 2021-07-28 DIAGNOSIS — H4052X4 Glaucoma secondary to other eye disorders, left eye, indeterminate stage: Secondary | ICD-10-CM

## 2021-07-28 DIAGNOSIS — N401 Enlarged prostate with lower urinary tract symptoms: Secondary | ICD-10-CM

## 2021-07-28 DIAGNOSIS — E11649 Type 2 diabetes mellitus with hypoglycemia without coma: Secondary | ICD-10-CM

## 2021-07-28 DIAGNOSIS — R569 Unspecified convulsions: Secondary | ICD-10-CM | POA: Diagnosis not present

## 2021-07-28 DIAGNOSIS — N186 End stage renal disease: Secondary | ICD-10-CM | POA: Diagnosis not present

## 2021-07-28 DIAGNOSIS — Z992 Dependence on renal dialysis: Secondary | ICD-10-CM

## 2021-07-28 DIAGNOSIS — I480 Paroxysmal atrial fibrillation: Secondary | ICD-10-CM

## 2021-07-28 DIAGNOSIS — E875 Hyperkalemia: Secondary | ICD-10-CM

## 2021-07-28 DIAGNOSIS — I10 Essential (primary) hypertension: Secondary | ICD-10-CM

## 2021-07-28 LAB — BASIC METABOLIC PANEL
Anion gap: 12 (ref 5–15)
BUN: 64 mg/dL — ABNORMAL HIGH (ref 6–20)
CO2: 24 mmol/L (ref 22–32)
Calcium: 8.2 mg/dL — ABNORMAL LOW (ref 8.9–10.3)
Chloride: 101 mmol/L (ref 98–111)
Creatinine, Ser: 9.68 mg/dL — ABNORMAL HIGH (ref 0.61–1.24)
GFR, Estimated: 6 mL/min — ABNORMAL LOW (ref >60)
Glucose, Bld: 69 mg/dL — ABNORMAL LOW (ref 70–99)
Potassium: 5.2 mmol/L — ABNORMAL HIGH (ref 3.5–5.1)
Sodium: 137 mmol/L (ref 135–145)

## 2021-07-28 LAB — GLUCOSE, CAPILLARY
Glucose-Capillary: 60 mg/dL — ABNORMAL LOW (ref 70–99)
Glucose-Capillary: 73 mg/dL (ref 70–99)
Glucose-Capillary: 75 mg/dL (ref 70–99)

## 2021-07-28 LAB — CBC
HCT: 28.7 % — ABNORMAL LOW (ref 39.0–52.0)
Hemoglobin: 8.6 g/dL — ABNORMAL LOW (ref 13.0–17.0)
MCH: 21.7 pg — ABNORMAL LOW (ref 26.0–34.0)
MCHC: 30 g/dL (ref 30.0–36.0)
MCV: 72.5 fL — ABNORMAL LOW (ref 80.0–100.0)
Platelets: 113 10*3/uL — ABNORMAL LOW (ref 150–400)
RBC: 3.96 MIL/uL — ABNORMAL LOW (ref 4.22–5.81)
RDW: 21.3 % — ABNORMAL HIGH (ref 11.5–15.5)
WBC: 14.5 10*3/uL — ABNORMAL HIGH (ref 4.0–10.5)
nRBC: 0 % (ref 0.0–0.2)

## 2021-07-28 LAB — HEPATITIS B SURFACE ANTIBODY, QUANTITATIVE: Hep B S AB Quant (Post): 312.8 m[IU]/mL (ref >9.9)

## 2021-07-28 MED ORDER — OXYCODONE HCL 5 MG PO TABS
5.0000 mg | ORAL_TABLET | ORAL | Status: DC | PRN
  Administered 2021-07-29: 5 mg via ORAL
  Filled 2021-07-28: qty 1

## 2021-07-28 MED ORDER — BENZOCAINE 10 % MT GEL
Freq: Three times a day (TID) | OROMUCOSAL | Status: DC | PRN
  Filled 2021-07-28: qty 9

## 2021-07-28 MED ORDER — DARBEPOETIN ALFA 60 MCG/0.3ML IJ SOSY
60.0000 ug | PREFILLED_SYRINGE | INTRAMUSCULAR | Status: DC
Start: 2021-08-04 — End: 2021-07-31

## 2021-07-28 NOTE — Progress Notes (Signed)
At 0625 this morning pt's CBG was 60. He was immediately given 4 fl oz of orange juice with 3 packets of sugar added. At 0649 CBG was re-checked and was found to be at 73. Report given to day shift, who will continue to monitor pt.

## 2021-07-28 NOTE — Progress Notes (Signed)
Received patient in bed, alert and oriented. Informed consent signed and in chart.  Time tx initiated:1223  Pre HD weight:89.2  Pre HD VS:see chart  Time tx completed:1554  HD treatment completed. Patient tolerated well. Fistula/Graft/HD catheter without signs and symptoms of complications. Patient transported back to the room, alert and orient and in no acute distress. Report given to bedside RN.  Total UF removed:2L  Medication given:none  Post HD VS:see chart  Post HD weight: 88.4kg

## 2021-07-28 NOTE — Progress Notes (Signed)
LTM maint complete - no skin breakdown under: Fp1 Fp2 Serviced  Fp2 [facility] monitored, Event button test confirmed by [facility].

## 2021-07-28 NOTE — Progress Notes (Signed)
vLTM setup  All impedances below 10kohms  [facility] to monitor   Patient event button tested

## 2021-07-28 NOTE — Progress Notes (Signed)
PROGRESS NOTE    [name].  EXH:[id number] DOB: Dec 24, 1970 DOA: 07/26/2021 PCP: [name], MD    Brief Narrative:  [name]. is a 51 year old male with past medical history of end-stage renal disease on dialysis Tuesday Thursday Saturday, history of recent stroke, myoclonus, diabetes mellitus with gastroparesis, hypertension, coronary artery disease with history of MI, paroxysmal atrial fibrillation presented to hospital with an episode of apnea and possible asystole after sustaining a seizure.  Patient also needed CPR and required hemodialysis for volume overload.  Patient had initially presented to [facility] but was transferred from there for need of continuous EEG monitoring.   Assessment and plan  Presumed seizure [facility]) History of recent stroke March - May 2023 and myoclonus after his stroke.  Patient had pictures of the tonic-clonic seizures at this time.  Neurology on board in currently plan for continuous EEG.  Continue Depakote and Klonopin.  Continue Plavix.  Follow neurology recommendations.  Hyperkalemia On hemodialysis Tuesday Thursday Saturday schedule.[facility] Kitchen  Received hemodialysis during hospitalization.  Potassium of 5.2 today.   ESRD (end stage renal disease) ([place]) Patient with ESRD on HD MWF but missed HD on 07/24/2021.  Received hemodialysis.  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  Currently on left internal jugular hemodialysis catheter.  Scheduled for fistula creation on left upper extremity on 07/30/2021.  Nephrology on board for hemodialysis.  Paroxysmal atrial fibrillation (HCC) Currently in sinus rhythm.  Continue Eliquis and amiodarone   Diabetes mellitus type 2. Latest hemoglobin A1c was 4.7 on 04/21/2021.  We will continue to monitor blood glucose levels.  Continue sliding scale insulin at this time.  Had mild hypoglycemia this morning.  At high  risk for hypoglycemia.   Essential hypertension. Continue Coreg twice a day, as needed clonidine.  Avapro on hold.  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 Had orbital implant for prothesis.Continue eyedrops   Benign prostatic hyperplasia with lower urinary tract symptoms Continue Flomax   Chronic Grade 2 diastolic heart failure/EF 55 to 60% Initial chest x-ray showed mild pulmonary edema.  Had missed hemodialysis.  Last 2D echocardiogram from 04/22/2021 with EF of 55 to 60% with grade 1 diastolic dysfunction.  Continue Coreg.  Hemodialysis for fluid management.      DVT prophylaxis: apixaban (ELIQUIS) tablet 2.5 mg Start: 07/26/21 2245 apixaban (ELIQUIS) tablet 2.5 mg   Code Status:     Code Status: Full Code  Disposition: Home  Status is: Inpatient  Remains inpatient appropriate because: Long-term EEG monitoring, need for hemodialysis, neurology follow-up   Family Communication: None  Consultants:  Neurology Vascular surgery  Procedures:  Hemodialysis  Antimicrobials:  None  Anti-infectives (From admission, onward)    None      Subjective: Today, patient was seen and examined at bedside.  Complains of mild oral pain.  Denies any headache, nausea vomiting.  No further seizures reported.  Objective: Vitals:   07/27/21 2308 07/28/21 0000 07/28/21 0400 07/28/21 0801  BP: 113/69 112/70 128/74 (!) 162/80  Pulse: 65 66 64 63  Resp: 18 18 16 16   Temp: 98.1 F (36.7 C) 98 F (36.7 C) 98 F (36.7 C) 98.1 F (36.7 C)  TempSrc: Oral Oral Oral Oral  SpO2: 99% 98%  99%  Weight: 90.7 kg     Height: 6\' 3"  (1.905 m)       Intake/Output Summary (Last 24 hours)  at 07/28/2021 1117 Last data filed at 07/27/2021 1800 Gross per 24 hour  Intake 290 ml  Output --  Net 290 ml   Filed Weights   07/26/21 2030 07/27/21 0310 07/27/21 2308  Weight: 94.3 kg 89.3 kg 90.7 kg    Physical Examination: Body mass index is 24.99 kg/m.    General:  Average built, not in obvious distress HENT:   Left eye with dressing status post enucleation.  Left internal jugular hemodialysis catheter access. Chest:  Clear breath sounds.  Diminished breath sounds bilaterally. No crackles or wheezes.  CVS: S1 &S2 heard. No murmur.  Regular rate and rhythm. Abdomen: Soft, nontender, nondistended.  Bowel sounds are heard.   Extremities: No cyanosis, clubbing or edema.  Peripheral pulses are palpable.  Right arm with nonfunctional AV fistula. Psych: Alert, awake and oriented, normal mood CNS:  No cranial nerve deficits.  Power equal in all extremities.   Skin: Warm and dry.  No rashes noted.  Data Reviewed:   CBC: Recent Labs  Lab 07/26/21 1415 07/27/21 0436 07/28/21 0307  WBC 8.4 16.9* 14.5*  NEUTROABS 5.5  --   --   HGB 10.0* 10.4* 8.6*  HCT 33.7* 33.8* 28.7*  MCV 73.1* 71.5* 72.5*  PLT 153 152 113*    Basic Metabolic Panel: Recent Labs  Lab 07/26/21 1415 07/26/21 1638 07/26/21 1934 07/26/21 2358 07/27/21 0436 07/28/21 0307  NA 139  --   --   --  137 137  K 6.5* 6.3* 6.0* 6.0* 3.9 5.2*  CL 108  --   --   --  100 101  CO2 18*  --   --   --  24 24  GLUCOSE 70  --   --   --  73 69*  BUN 79*  --   --   --  49* 64*  CREATININE 11.07*  --   --   --  7.28* 9.68*  CALCIUM 8.4*  --   --   --  8.4* 8.2*  MG 1.9  --   --   --   --   --   PHOS  --   --   --   --  7.5*  --     Liver Function Tests: Recent Labs  Lab 07/26/21 1415 07/27/21 0436  AST 12*  --   ALT 18  --   ALKPHOS 98  --   BILITOT 0.6  --   PROT 6.5  --   ALBUMIN 3.2* 3.0*     Radiology Studies: DG Chest 1 View  Result Date: 07/26/2021 CLINICAL DATA:  Acute mental status change EXAM: CHEST  1 VIEW COMPARISON:  Jun 23, 2021 FINDINGS: Stable cardiomegaly. The hila and mediastinum are unchanged. The left central line is stable. No pneumothorax. Increased interstitial markings in the lungs consistent with mild pulmonary edema. IMPRESSION: Cardiomegaly and  mild pulmonary edema. No other acute abnormalities. Electronically Signed   By: Dorise Bullion III M.D.   On: 07/26/2021 13:05   CT Head Wo Contrast  Result Date: 07/26/2021 CLINICAL DATA:  Mental status change, unknown cause EXAM: CT HEAD WITHOUT CONTRAST TECHNIQUE: Contiguous axial images were obtained from the base of the skull through the vertex without intravenous contrast. RADIATION DOSE REDUCTION: This exam was performed according to the departmental dose-optimization program which includes automated exposure control, adjustment of the mA and/or kV according to patient size and/or use of iterative reconstruction technique. COMPARISON:  Same day CT; CT dated Jun 23, 2021 FINDINGS:  Brain: No evidence of acute infarction, hemorrhage, hydrocephalus, extra-axial collection or mass lesion/mass effect. Periventricular white matter hypodensities consistent with sequela of chronic microvascular ischemic disease. Vascular: Severe vascular calcifications. Skull: Normal. Negative for fracture or focal lesion. Sinuses/Orbits: Postsurgical changes are noted in the LEFT orbit with soft tissue air in the curvilinear prosthesis. Other: Similar soft tissue thickening along the posterior scalp. IMPRESSION: No acute intracranial abnormality. Electronically Signed   By: Valentino Saxon M.D.   On: 07/26/2021 18:36   CT Head Wo Contrast  Result Date: 07/26/2021 CLINICAL DATA:  Head trauma, moderate-severe; Neck trauma, dangerous injury mechanism (Age 56-64y) EXAM: CT HEAD WITHOUT CONTRAST CT CERVICAL SPINE WITHOUT CONTRAST TECHNIQUE: Multidetector CT imaging of the head and cervical spine was performed following the standard protocol without intravenous contrast. Multiplanar CT image reconstructions of the cervical spine were also generated. RADIATION DOSE REDUCTION: This exam was performed according to the departmental dose-optimization program which includes automated exposure control, adjustment of the mA and/or kV  according to patient size and/or use of iterative reconstruction technique. COMPARISON:  CT dated June 16, 2021 FINDINGS: CT HEAD FINDINGS Brain: No evidence of acute infarction, hemorrhage, hydrocephalus, extra-axial collection or mass lesion/mass effect. Periventricular white matter hypodensities consistent with sequela of chronic microvascular ischemic disease. Vascular: Vascular calcifications. Skull: Normal. Negative for fracture or focal lesion. Sinuses/Orbits: Postsurgical changes within the LEFT orbit with small amount of air and curvilinear likely prosthesis. Other: None CT CERVICAL SPINE FINDINGS Evaluation limited secondary to motion. Alignment: Straightening of the cervical lordosis. No acute spondylolisthesis. Skull base and vertebrae: No definitive acute fracture. Soft tissues and spinal canal: No prevertebral fluid or swelling. No visible canal hematoma. Disc levels: Moderate intervertebral disc space height loss with uncovertebral hypertrophy at C3-4. Mild multilevel intervertebral disc space height loss of the rest of the cervical spine. Upper chest: Diffuse vascular calcifications. Partial visualization of a LEFT chest CVC. Other: None IMPRESSION: 1.  No acute intracranial abnormality. 2. Postsurgical changes of the LEFT orbit. 3.  No acute fracture or static subluxation of the cervical spine. Electronically Signed   By: Valentino Saxon M.D.   On: 07/26/2021 13:39   CT Cervical Spine Wo Contrast  Result Date: 07/26/2021 CLINICAL DATA:  Head trauma, moderate-severe; Neck trauma, dangerous injury mechanism (Age 29-64y) EXAM: CT HEAD WITHOUT CONTRAST CT CERVICAL SPINE WITHOUT CONTRAST TECHNIQUE: Multidetector CT imaging of the head and cervical spine was performed following the standard protocol without intravenous contrast. Multiplanar CT image reconstructions of the cervical spine were also generated. RADIATION DOSE REDUCTION: This exam was performed according to the departmental  dose-optimization program which includes automated exposure control, adjustment of the mA and/or kV according to patient size and/or use of iterative reconstruction technique. COMPARISON:  CT dated June 16, 2021 FINDINGS: CT HEAD FINDINGS Brain: No evidence of acute infarction, hemorrhage, hydrocephalus, extra-axial collection or mass lesion/mass effect. Periventricular white matter hypodensities consistent with sequela of chronic microvascular ischemic disease. Vascular: Vascular calcifications. Skull: Normal. Negative for fracture or focal lesion. Sinuses/Orbits: Postsurgical changes within the LEFT orbit with small amount of air and curvilinear likely prosthesis. Other: None CT CERVICAL SPINE FINDINGS Evaluation limited secondary to motion. Alignment: Straightening of the cervical lordosis. No acute spondylolisthesis. Skull base and vertebrae: No definitive acute fracture. Soft tissues and spinal canal: No prevertebral fluid or swelling. No visible canal hematoma. Disc levels: Moderate intervertebral disc space height loss with uncovertebral hypertrophy at C3-4. Mild multilevel intervertebral disc space height loss of the rest of the cervical spine.  Upper chest: Diffuse vascular calcifications. Partial visualization of a LEFT chest CVC. Other: None IMPRESSION: 1.  No acute intracranial abnormality. 2. Postsurgical changes of the LEFT orbit. 3.  No acute fracture or static subluxation of the cervical spine. Electronically Signed   By: Valentino Saxon M.D.   On: 07/26/2021 13:39   Portable Chest xray  Result Date: 07/27/2021 CLINICAL DATA:  CHF. EXAM: PORTABLE CHEST 1 VIEW COMPARISON:  07/26/2021 FINDINGS: Left chest wall dialysis catheter is identified with tips in the cavoatrial junction. Stable cardiac enlargement. No signs of pleural effusion or edema. No airspace opacities. IMPRESSION: 1. Stable cardiac enlargement. 2. No heart failure. Electronically Signed   By: Kerby Moors M.D.   On: 07/27/2021  05:37      LOS: 2 days    Flora Lipps, MD [facility] Available via Epic secure chat 7am-7pm After these hours, please refer to coverage provider listed on [email or web address] 07/28/2021, 11:17 AM

## 2021-07-28 NOTE — Progress Notes (Signed)
Patient ID: [name]., male   DOB: 28-Aug-1970, 51 y.o.   MRN: [id number] S: Transferred from [facility] for neurologic evaluation related to seziures.  Feeling better today but still lethargic.  Wife at bedside. O:BP (!) 162/80 (BP Location: Left Arm)   Pulse 63   Temp 98.1 F (36.7 C) (Oral)   Resp 16   Ht 6\' 3"  (1.905 m)   Wt 90.7 kg   SpO2 99%   BMI 24.99 kg/m   Intake/Output Summary (Last 24 hours) at 07/28/2021 1154 Last data filed at 07/27/2021 1800 Gross per 24 hour  Intake 290 ml  Output --  Net 290 ml   Intake/Output: I/O last 3 completed shifts: In: 440 [P.O.:390; IV Piggyback:50] Out: 2900 [Other:2900]  Intake/Output this shift:  No intake/output data recorded. Weight change: -3.648 kg Gen:NAD, eye patch over OS CVS:RRR Resp: CTA Abd: +BS, soft, NT/ND Ext: no edema  Recent Labs  Lab 07/26/21 1415 07/26/21 1638 07/26/21 1934 07/26/21 2358 07/27/21 0436 07/28/21 0307  NA 139  --   --   --  137 137  K 6.5* 6.3* 6.0* 6.0* 3.9 5.2*  CL 108  --   --   --  100 101  CO2 18*  --   --   --  24 24  GLUCOSE 70  --   --   --  73 69*  BUN 79*  --   --   --  49* 64*  CREATININE 11.07*  --   --   --  7.28* 9.68*  ALBUMIN 3.2*  --   --   --  3.0*  --   CALCIUM 8.4*  --   --   --  8.4* 8.2*  PHOS  --   --   --   --  7.5*  --   AST 12*  --   --   --   --   --   ALT 18  --   --   --   --   --    Liver Function Tests: Recent Labs  Lab 07/26/21 1415 07/27/21 0436  AST 12*  --   ALT 18  --   ALKPHOS 98  --   BILITOT 0.6  --   PROT 6.5  --   ALBUMIN 3.2* 3.0*   No results for input(s): LIPASE, AMYLASE in the last 168 hours. No results for input(s): AMMONIA in the last 168 hours. CBC: Recent Labs  Lab 07/26/21 1415 07/27/21 0436 07/28/21 0307  WBC 8.4 16.9* 14.5*  NEUTROABS 5.5  --   --   HGB 10.0* 10.4* 8.6*  HCT 33.7* 33.8* 28.7*  MCV 73.1* 71.5* 72.5*  PLT 153 152 113*   Cardiac Enzymes: Recent Labs  Lab 07/27/21 0436  CKTOTAL 196    CBG: Recent Labs  Lab 07/27/21 1122 07/27/21 1647 07/27/21 2154 07/28/21 0625 07/28/21 0649  GLUCAP 80 88 87 60* 73    Iron Studies: No results for input(s): IRON, TIBC, TRANSFERRIN, FERRITIN in the last 72 hours. Studies/Results: DG Chest 1 View  Result Date: 07/26/2021 CLINICAL DATA:  Acute mental status change EXAM: CHEST  1 VIEW COMPARISON:  Jun 23, 2021 FINDINGS: Stable cardiomegaly. The hila and mediastinum are unchanged. The left central line is stable. No pneumothorax. Increased interstitial markings in the lungs consistent with mild pulmonary edema. IMPRESSION: Cardiomegaly and mild pulmonary edema. No other acute abnormalities. Electronically Signed   By: Dorise Bullion III M.D.   On: 07/26/2021 13:05  CT Head Wo Contrast  Result Date: 07/26/2021 CLINICAL DATA:  Mental status change, unknown cause EXAM: CT HEAD WITHOUT CONTRAST TECHNIQUE: Contiguous axial images were obtained from the base of the skull through the vertex without intravenous contrast. RADIATION DOSE REDUCTION: This exam was performed according to the departmental dose-optimization program which includes automated exposure control, adjustment of the mA and/or kV according to patient size and/or use of iterative reconstruction technique. COMPARISON:  Same day CT; CT dated Jun 23, 2021 FINDINGS: Brain: No evidence of acute infarction, hemorrhage, hydrocephalus, extra-axial collection or mass lesion/mass effect. Periventricular white matter hypodensities consistent with sequela of chronic microvascular ischemic disease. Vascular: Severe vascular calcifications. Skull: Normal. Negative for fracture or focal lesion. Sinuses/Orbits: Postsurgical changes are noted in the LEFT orbit with soft tissue air in the curvilinear prosthesis. Other: Similar soft tissue thickening along the posterior scalp. IMPRESSION: No acute intracranial abnormality. Electronically Signed   By: Valentino Saxon M.D.   On: 07/26/2021 18:36   CT Head  Wo Contrast  Result Date: 07/26/2021 CLINICAL DATA:  Head trauma, moderate-severe; Neck trauma, dangerous injury mechanism (Age 37-64y) EXAM: CT HEAD WITHOUT CONTRAST CT CERVICAL SPINE WITHOUT CONTRAST TECHNIQUE: Multidetector CT imaging of the head and cervical spine was performed following the standard protocol without intravenous contrast. Multiplanar CT image reconstructions of the cervical spine were also generated. RADIATION DOSE REDUCTION: This exam was performed according to the departmental dose-optimization program which includes automated exposure control, adjustment of the mA and/or kV according to patient size and/or use of iterative reconstruction technique. COMPARISON:  CT dated June 16, 2021 FINDINGS: CT HEAD FINDINGS Brain: No evidence of acute infarction, hemorrhage, hydrocephalus, extra-axial collection or mass lesion/mass effect. Periventricular white matter hypodensities consistent with sequela of chronic microvascular ischemic disease. Vascular: Vascular calcifications. Skull: Normal. Negative for fracture or focal lesion. Sinuses/Orbits: Postsurgical changes within the LEFT orbit with small amount of air and curvilinear likely prosthesis. Other: None CT CERVICAL SPINE FINDINGS Evaluation limited secondary to motion. Alignment: Straightening of the cervical lordosis. No acute spondylolisthesis. Skull base and vertebrae: No definitive acute fracture. Soft tissues and spinal canal: No prevertebral fluid or swelling. No visible canal hematoma. Disc levels: Moderate intervertebral disc space height loss with uncovertebral hypertrophy at C3-4. Mild multilevel intervertebral disc space height loss of the rest of the cervical spine. Upper chest: Diffuse vascular calcifications. Partial visualization of a LEFT chest CVC. Other: None IMPRESSION: 1.  No acute intracranial abnormality. 2. Postsurgical changes of the LEFT orbit. 3.  No acute fracture or static subluxation of the cervical spine.  Electronically Signed   By: Valentino Saxon M.D.   On: 07/26/2021 13:39   CT Cervical Spine Wo Contrast  Result Date: 07/26/2021 CLINICAL DATA:  Head trauma, moderate-severe; Neck trauma, dangerous injury mechanism (Age 28-64y) EXAM: CT HEAD WITHOUT CONTRAST CT CERVICAL SPINE WITHOUT CONTRAST TECHNIQUE: Multidetector CT imaging of the head and cervical spine was performed following the standard protocol without intravenous contrast. Multiplanar CT image reconstructions of the cervical spine were also generated. RADIATION DOSE REDUCTION: This exam was performed according to the departmental dose-optimization program which includes automated exposure control, adjustment of the mA and/or kV according to patient size and/or use of iterative reconstruction technique. COMPARISON:  CT dated June 16, 2021 FINDINGS: CT HEAD FINDINGS Brain: No evidence of acute infarction, hemorrhage, hydrocephalus, extra-axial collection or mass lesion/mass effect. Periventricular white matter hypodensities consistent with sequela of chronic microvascular ischemic disease. Vascular: Vascular calcifications. Skull: Normal. Negative for fracture or focal lesion. Sinuses/Orbits:  Postsurgical changes within the LEFT orbit with small amount of air and curvilinear likely prosthesis. Other: None CT CERVICAL SPINE FINDINGS Evaluation limited secondary to motion. Alignment: Straightening of the cervical lordosis. No acute spondylolisthesis. Skull base and vertebrae: No definitive acute fracture. Soft tissues and spinal canal: No prevertebral fluid or swelling. No visible canal hematoma. Disc levels: Moderate intervertebral disc space height loss with uncovertebral hypertrophy at C3-4. Mild multilevel intervertebral disc space height loss of the rest of the cervical spine. Upper chest: Diffuse vascular calcifications. Partial visualization of a LEFT chest CVC. Other: None IMPRESSION: 1.  No acute intracranial abnormality. 2. Postsurgical  changes of the LEFT orbit. 3.  No acute fracture or static subluxation of the cervical spine. Electronically Signed   By: Valentino Saxon M.D.   On: 07/26/2021 13:39   Portable Chest xray  Result Date: 07/27/2021 CLINICAL DATA:  CHF. EXAM: PORTABLE CHEST 1 VIEW COMPARISON:  07/26/2021 FINDINGS: Left chest wall dialysis catheter is identified with tips in the cavoatrial junction. Stable cardiac enlargement. No signs of pleural effusion or edema. No airspace opacities. IMPRESSION: 1. Stable cardiac enlargement. 2. No heart failure. Electronically Signed   By: Kerby Moors M.D.   On: 07/27/2021 05:37    amiodarone  200 mg Oral Daily   apixaban  2.5 mg Oral BID   atorvastatin  80 mg Oral Daily   calcium acetate  1,334 mg Oral TID WC   carvedilol  25 mg Oral BID   Chlorhexidine Gluconate Cloth  6 each Topical Q0600   clonazePAM  0.5 mg Oral BID   clopidogrel  75 mg Oral Daily   cyclobenzaprine  10 mg Oral TID   divalproex  500 mg Oral Q8H   dorzolamide-timolol  1 drop Right Eye BID   erythromycin   Right Eye QHS   insulin aspart  0-20 Units Subcutaneous TID WC   mouth rinse  15 mL Mouth Rinse BID   neomycin-polymyxin b-dexamethasone  1 application. Left Eye QHS   tamsulosin  0.4 mg Oral Daily    BMET    Component Value Date/Time   NA 137 07/28/2021 0307   K 5.2 (H) 07/28/2021 0307   CL 101 07/28/2021 0307   CO2 24 07/28/2021 0307   GLUCOSE 69 (L) 07/28/2021 0307   BUN 64 (H) 07/28/2021 0307   CREATININE 9.68 (H) 07/28/2021 0307   CALCIUM 8.2 (L) 07/28/2021 0307   GFRNONAA 6 (L) 07/28/2021 0307   GFRAA 5 (L) 08/27/2019 1540   CBC    Component Value Date/Time   WBC 14.5 (H) 07/28/2021 0307   RBC 3.96 (L) 07/28/2021 0307   HGB 8.6 (L) 07/28/2021 0307   HCT 28.7 (L) 07/28/2021 0307   PLT 113 (L) 07/28/2021 0307   MCV 72.5 (L) 07/28/2021 0307   MCH 21.7 (L) 07/28/2021 0307   MCHC 30.0 07/28/2021 0307   RDW 21.3 (H) 07/28/2021 0307   LYMPHSABS 1.4 07/26/2021 1415   MONOABS  0.9 07/26/2021 1415   EOSABS 0.6 (H) 07/26/2021 1415   BASOSABS 0.1 07/26/2021 1415   Dialysis Orders: Center: [facility]   on TTS . EDW 87 kg HD Bath 2K/2.5Ca  Time 3:30 Heparin . Access Left [place] catheter, RUE AVF 1800 units IVP then 400 units/hr BFR 400 DFR 800    Micera 150 mcg IVP every 2 weeks, Venofer 50 mg IV TTS, calcitriol 0.5 mcg TTS  Assessment/Plan:  Presumed seizure - EEG negative, now on long-term EEG monitoring.  Neuro following. ESRD - normally  TTS (but is noncompliant and usually only goes 2 days/week.  S/p emergent HD on 07/26/21.  Will plan for HD today to keep on his outpatient schedule. Hyperkalemia - resolved with HD. Paroxysmal atrial fibrillation - currently in NSR.  Continue Eliquis and amiodarone. DM type 2 HTN - stable Chronic diastolic CHF - improved with HD and UF. Anemia of ESRD - follow H/H and continue with ESA. 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  Donetta Potts, MD [facility]

## 2021-07-28 NOTE — Procedures (Addendum)
Patient Name: [name].  MRN: [id number]  Epilepsy Attending: Charlsie Quest  Referring Physician/Provider: Charlsie Quest, MD Date: 07/28/2021 Duration: 21.14 mins  Patient history:  51 year old male with end-stage renal disease: HD, episodes of muscle jerking on Klonopin who presented with seizure-like episodes in the setting of missing dialysis.  EEG to evaluate for seizure.  Level of alertness: Awake, asleep  AEDs during EEG study: VPA, Clonazepam  Technical aspects: This EEG study was done with scalp electrodes positioned according to the 10-20 International system of electrode placement. Electrical activity was acquired at a sampling rate of 500Hz  and reviewed with a high frequency filter of 70Hz  and a low frequency filter of 1Hz . EEG data were recorded continuously and digitally stored.   Description: The posterior dominant rhythm consists of 9 Hz activity of moderate voltage (25-35 uV) seen predominantly in posterior head regions, symmetric and reactive to eye opening and eye closing. Sleep was characterized by vertex waves, sleep spindles (12 to 14 Hz), maximal frontocentral region. Hyperventilation and photic stimulation were not performed.     IMPRESSION: This study is within normal limits. No seizures or epileptiform discharges were seen throughout the recording.  [name] Annabelle Harman

## 2021-07-28 NOTE — Hospital Course (Addendum)
[name]. is a 51 year old male with past medical history of end-stage renal disease on dialysis Tuesday Thursday Saturday, history of recent stroke, myoclonus, diabetes mellitus with gastroparesis, hypertension, coronary artery disease with history of MI, paroxysmal atrial fibrillation presented to hospital with an episode of apnea and possible asystole after sustaining a seizure.  Patient also needed CPR and required hemodialysis for volume overload.  Patient had initially presented to [facility] but was transferred from there for need of continuous EEG monitoring.   Assessment and plan  Presumed seizure [facility]) History of recent stroke March - May 2023 and myoclonus after his stroke.  Patient had pictures of the tonic-clonic seizures at this time.  Neurology on board in currently plan for continuous EEG.  Continue Depakote and Klonopin.  Continue Plavix.  Follow neurology recommendations.  Hyperkalemia On hemodialysis Tuesday Thursday Saturday schedule.Marland Kitchen  Received hemodialysis during hospitalization.  Potassium of 5.2 today.   ESRD (end stage renal disease) ([place]) Patient with ESRD on HD MWF but missed HD on 07/24/2021.  Received hemodialysis.  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  Currently on left internal jugular hemodialysis catheter.  Scheduled for fistula creation on left upper extremity on 07/30/2021.  Nephrology on board for hemodialysis.  Paroxysmal atrial fibrillation (HCC) Currently in sinus rhythm.  Continue Eliquis and amiodarone   Diabetes mellitus type 2. Latest hemoglobin A1c was 4.7 on 04/21/2021.  We will continue to monitor blood glucose levels.  Continue sliding scale insulin at this time.  Had mild hypoglycemia this morning.  At high risk for hypoglycemia.   Essential hypertension. Continue Coreg twice a day, as needed clonidine.  Avapro on hold.  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 Had orbital implant for prothesis.Continue eyedrops   Benign prostatic hyperplasia with lower urinary tract symptoms Continue Flomax   Chronic Grade 2 diastolic heart failure/EF 55 to 60% Initial chest x-ray showed mild pulmonary edema.  Had missed hemodialysis.  Last 2D echocardiogram from 04/22/2021 with EF of 55 to 60% with grade 1 diastolic dysfunction.  Continue Coreg.  Hemodialysis for fluid management.

## 2021-07-28 NOTE — Progress Notes (Signed)
EEG complete - results pending 

## 2021-07-28 NOTE — Progress Notes (Signed)
Subjective: No acute events overnight.  Per wife at bedside patient has been mumbling things which is not typical.  Denies any new concerns.  ROS: negative except above  Examination  Vital signs in last 24 hours: Temp:  [98 F (36.7 C)-99.4 F (37.4 C)] 98.1 F (36.7 C) (06/06 0801) Pulse Rate:  [63-78] 63 (06/06 0801) Resp:  [15-20] 16 (06/06 0801) BP: (112-162)/(64-80) 162/80 (06/06 0801) SpO2:  [90 %-100 %] 99 % (06/06 0801) Weight:  [90.7 kg] 90.7 kg (06/05 2308)  General: lying in bed, NAD Eye: Left eye has a dressing on it Tongue: Right tongue bite Neuro:  AOx3, cranial nerves II to XII appear grossly intact except dressing on left eyes unable to examine, no apparent facial asymmetry, 5/5 in left upper and lower extremity, 4/5 in right upper and lower extremity, FTN intact bilaterally,  Basic Metabolic Panel: Recent Labs  Lab 07/26/21 1415 07/26/21 1638 07/26/21 1934 07/26/21 2358 07/27/21 0436 07/28/21 0307  NA 139  --   --   --  137 137  K 6.5* 6.3* 6.0* 6.0* 3.9 5.2*  CL 108  --   --   --  100 101  CO2 18*  --   --   --  24 24  GLUCOSE 70  --   --   --  73 69*  BUN 79*  --   --   --  49* 64*  CREATININE 11.07*  --   --   --  7.28* 9.68*  CALCIUM 8.4*  --   --   --  8.4* 8.2*  MG 1.9  --   --   --   --   --   PHOS  --   --   --   --  7.5*  --     CBC: Recent Labs  Lab 07/26/21 1415 07/27/21 0436 07/28/21 0307  WBC 8.4 16.9* 14.5*  NEUTROABS 5.5  --   --   HGB 10.0* 10.4* 8.6*  HCT 33.7* 33.8* 28.7*  MCV 73.1* 71.5* 72.5*  PLT 153 152 113*     Coagulation Studies: No results for input(s): LABPROT, INR in the last 72 hours.  Imaging No new brain imaging overnight   ASSESSMENT AND PLAN: 51 year old male with end-stage renal disease: HD, episodes of muscle jerking on Klonopin who presented with seizure-like episodes in the setting of missing dialysis.   Seizure-like episodes End-stage renal disease on HD -Differentials for these episodes  include myoclonus versus epileptic seizures versus combination of both   Recommendations: -On video EEG for characterization of spells -Will hold Klonopin, Depakote, Flexeril for seizure provocation -Continue seizure precautions -IV Ativan 2 mg for clinical seizure-like activity -Discussed plan with patient, wife at bedside as well as Dr. Louanne Belton  I have spent a total of 36   minutes with the patient reviewing hospital notes,  test results, labs and examining the patient as well as establishing an assessment and plan that was discussed personally with the patient.  > 50% of time was spent in direct patient care.   Zeb Comfort Epilepsy [facility] For questions after 5pm please refer to AMION to reach the Neurologist on call

## 2021-07-29 ENCOUNTER — Other Ambulatory Visit (HOSPITAL_COMMUNITY): Payer: Self-pay

## 2021-07-29 DIAGNOSIS — R569 Unspecified convulsions: Secondary | ICD-10-CM | POA: Diagnosis not present

## 2021-07-29 LAB — GLUCOSE, CAPILLARY
Glucose-Capillary: 125 mg/dL — ABNORMAL HIGH (ref 70–99)
Glucose-Capillary: 72 mg/dL (ref 70–99)
Glucose-Capillary: 84 mg/dL (ref 70–99)

## 2021-07-29 LAB — BASIC METABOLIC PANEL
Anion gap: 14 (ref 5–15)
BUN: 42 mg/dL — ABNORMAL HIGH (ref 6–20)
CO2: 26 mmol/L (ref 22–32)
Calcium: 8.6 mg/dL — ABNORMAL LOW (ref 8.9–10.3)
Chloride: 94 mmol/L — ABNORMAL LOW (ref 98–111)
Creatinine, Ser: 7.29 mg/dL — ABNORMAL HIGH (ref 0.61–1.24)
GFR, Estimated: 8 mL/min — ABNORMAL LOW (ref >60)
Glucose, Bld: 90 mg/dL (ref 70–99)
Potassium: 4 mmol/L (ref 3.5–5.1)
Sodium: 134 mmol/L — ABNORMAL LOW (ref 135–145)

## 2021-07-29 LAB — CBC
HCT: 27.8 % — ABNORMAL LOW (ref 39.0–52.0)
Hemoglobin: 8.4 g/dL — ABNORMAL LOW (ref 13.0–17.0)
MCH: 21.6 pg — ABNORMAL LOW (ref 26.0–34.0)
MCHC: 30.2 g/dL (ref 30.0–36.0)
MCV: 71.6 fL — ABNORMAL LOW (ref 80.0–100.0)
Platelets: 111 10*3/uL — ABNORMAL LOW (ref 150–400)
RBC: 3.88 MIL/uL — ABNORMAL LOW (ref 4.22–5.81)
RDW: 21.2 % — ABNORMAL HIGH (ref 11.5–15.5)
WBC: 9.9 10*3/uL (ref 4.0–10.5)
nRBC: 0 % (ref 0.0–0.2)

## 2021-07-29 LAB — MAGNESIUM: Magnesium: 1.7 mg/dL (ref 1.7–2.4)

## 2021-07-29 MED ORDER — HYDROCODONE BIT-HOMATROP MBR 5-1.5 MG/5ML PO SOLN
5.0000 mL | ORAL | Status: DC | PRN
  Administered 2021-07-29: 5 mL via ORAL
  Filled 2021-07-29: qty 5

## 2021-07-29 MED ORDER — LEVETIRACETAM 250 MG PO TABS
250.0000 mg | ORAL_TABLET | ORAL | Status: DC
  Administered 2021-07-30: 250 mg via ORAL
  Filled 2021-07-29: qty 1

## 2021-07-29 MED ORDER — LEVETIRACETAM 500 MG PO TABS
500.0000 mg | ORAL_TABLET | Freq: Two times a day (BID) | ORAL | Status: DC
Start: 2021-07-29 — End: 2021-07-31
  Administered 2021-07-29 – 2021-07-30 (×3): 500 mg via ORAL
  Filled 2021-07-29 (×3): qty 1

## 2021-07-29 MED ORDER — LEVETIRACETAM 500 MG PO TABS: 500.0000 mg | ORAL_TABLET | Freq: Two times a day (BID) | ORAL | Status: DC

## 2021-07-29 NOTE — Progress Notes (Signed)
Patient ID: [name], male   DOB: February 07, 1971, 51 y.o.   MRN: [id number] S: More awake and alert this morning.  Asking for more food. O:BP (!) 140/108 (BP Location: Left Arm)   Pulse 66   Temp 98.7 F (37.1 C) (Oral)   Resp 18   Ht 6\' 3"  (1.905 m)   Wt 88.4 kg   SpO2 98%   BMI 24.36 kg/m  No intake or output data in the 24 hours ending 07/29/21 1049 Intake/Output: No intake/output data recorded.  Intake/Output this shift:  No intake/output data recorded. Weight change: -1.5 kg Gen:NAD CVS: RRR Resp: CTA Abd: +Bs, soft, NT/ND Ext: no edema, RUE AVF +T/B  Recent Labs  Lab 07/26/21 1415 07/26/21 1638 07/26/21 1934 07/26/21 2358 07/27/21 0436 07/28/21 0307 07/29/21 0524  NA 139  --   --   --  137 137 134*  K 6.5* 6.3* 6.0* 6.0* 3.9 5.2* 4.0  CL 108  --   --   --  100 101 94*  CO2 18*  --   --   --  24 24 26   GLUCOSE 70  --   --   --  73 69* 90  BUN 79*  --   --   --  49* 64* 42*  CREATININE 11.07*  --   --   --  7.28* 9.68* 7.29*  ALBUMIN 3.2*  --   --   --  3.0*  --   --   CALCIUM 8.4*  --   --   --  8.4* 8.2* 8.6*  PHOS  --   --   --   --  7.5*  --   --   AST 12*  --   --   --   --   --   --   ALT 18  --   --   --   --   --   --    Liver Function Tests: Recent Labs  Lab 07/26/21 1415 07/27/21 0436  AST 12*  --   ALT 18  --   ALKPHOS 98  --   BILITOT 0.6  --   PROT 6.5  --   ALBUMIN 3.2* 3.0*   No results for input(s): LIPASE, AMYLASE in the last 168 hours. No results for input(s): AMMONIA in the last 168 hours. CBC: Recent Labs  Lab 07/26/21 1415 07/27/21 0436 07/28/21 0307 07/29/21 0524  WBC 8.4 16.9* 14.5* 9.9  NEUTROABS 5.5  --   --   --   HGB 10.0* 10.4* 8.6* 8.4*  HCT 33.7* 33.8* 28.7* 27.8*  MCV 73.1* 71.5* 72.5* 71.6*  PLT 153 152 113* 111*   Cardiac Enzymes: Recent Labs  Lab 07/27/21 0436  CKTOTAL 196   CBG: Recent Labs  Lab 07/27/21 2154 07/28/21 0625 07/28/21 0649 07/28/21 1804 07/29/21 0818  GLUCAP 87 60* 73 75  125*    Iron Studies: No results for input(s): IRON, TIBC, TRANSFERRIN, FERRITIN in the last 72 hours. Studies/Results: EEG adult  Result Date: 07/28/2021 Lora Havens, MD     07/29/2021  9:13 AM Patient Name: [name] MRN: [id number] Epilepsy Attending: Lora Havens Referring Physician/Provider: Lora Havens, MD Date: 07/28/2021 Duration: 21.14 mins Patient history:  51 year old male with end-stage renal disease: HD, episodes of muscle jerking on Klonopin who presented with seizure-like episodes in the setting of missing dialysis.  EEG to evaluate for seizure. Level of alertness: Awake, asleep AEDs during EEG study: VPA, Clonazepam  Technical aspects: This EEG study was done with scalp electrodes positioned according to the 10-20 International system of electrode placement. Electrical activity was acquired at a sampling rate of 500Hz  and reviewed with a high frequency filter of 70Hz  and a low frequency filter of 1Hz . EEG data were recorded continuously and digitally stored. Description: The posterior dominant rhythm consists of 9 Hz activity of moderate voltage (25-35 uV) seen predominantly in posterior head regions, symmetric and reactive to eye opening and eye closing. Sleep was characterized by vertex waves, sleep spindles (12 to 14 Hz), maximal frontocentral region. Hyperventilation and photic stimulation were not performed.   IMPRESSION: This study is within normal limits. No seizures or epileptiform discharges were seen throughout the recording. Priyanka [name]   Overnight EEG with video  Result Date: 07/29/2021 Lora Havens, MD     07/29/2021  9:43 AM Patient Name: [name] MRN: [id number] Epilepsy Attending: Lora Havens Referring Physician/Provider: Lora Havens, MD Duration: 07/28/2021 1130 to 07/29/2021 0945  Patient history:  51 year old male with end-stage renal disease: HD, episodes of muscle jerking on Klonopin who presented with seizure-like episodes  in the setting of missing dialysis.  EEG to evaluate for seizure.  Level of alertness: Awake, asleep  AEDs during EEG study: None  Technical aspects: This EEG study was done with scalp electrodes positioned according to the 10-20 International system of electrode placement. Electrical activity was acquired at a sampling rate of 500Hz  and reviewed with a high frequency filter of 70Hz  and a low frequency filter of 1Hz . EEG data were recorded continuously and digitally stored.  Description: The posterior dominant rhythm consists of 9 Hz activity of moderate voltage (25-35 uV) seen predominantly in posterior head regions, symmetric and reactive to eye opening and eye closing. Sleep was characterized by vertex waves, sleep spindles (12 to 14 Hz), maximal frontocentral region. Hyperventilation and photic stimulation were not performed.    IMPRESSION: This study is within normal limits. No seizures or epileptiform discharges were seen throughout the recording.  Priyanka [name]     amiodarone  200 mg Oral Daily   apixaban  2.5 mg Oral BID   atorvastatin  80 mg Oral Daily   calcium acetate  1,334 mg Oral TID WC   carvedilol  25 mg Oral BID   Chlorhexidine Gluconate Cloth  6 each Topical Q0600   clopidogrel  75 mg Oral Daily   [START ON 08/04/2021] darbepoetin (ARANESP) injection - DIALYSIS  60 mcg Intravenous Q Tue-HD   dorzolamide-timolol  1 drop Right Eye BID   erythromycin   Right Eye QHS   insulin aspart  0-20 Units Subcutaneous TID WC   mouth rinse  15 mL Mouth Rinse BID   neomycin-polymyxin b-dexamethasone  1 application. Left Eye QHS   tamsulosin  0.4 mg Oral Daily    BMET    Component Value Date/Time   NA 134 (L) 07/29/2021 0524   K 4.0 07/29/2021 0524   CL 94 (L) 07/29/2021 0524   CO2 26 07/29/2021 0524   GLUCOSE 90 07/29/2021 0524   BUN 42 (H) 07/29/2021 0524   CREATININE 7.29 (H) 07/29/2021 0524   CALCIUM 8.6 (L) 07/29/2021 0524   GFRNONAA 8 (L) 07/29/2021 0524   GFRAA 5 (L) 08/27/2019  1540   CBC    Component Value Date/Time   WBC 9.9 07/29/2021 0524   RBC 3.88 (L) 07/29/2021 0524   HGB 8.4 (L) 07/29/2021 0524   HCT 27.8 (L) 07/29/2021 5621  PLT 111 (L) 07/29/2021 0524   MCV 71.6 (L) 07/29/2021 0524   MCH 21.6 (L) 07/29/2021 0524   MCHC 30.2 07/29/2021 0524   RDW 21.2 (H) 07/29/2021 0524   LYMPHSABS 1.4 07/26/2021 1415   MONOABS 0.9 07/26/2021 1415   EOSABS 0.6 (H) 07/26/2021 1415   BASOSABS 0.1 07/26/2021 1415   Dialysis Orders: Center: [facility]  on TTS . EDW 87 kg HD [place] 2K/2.5Ca  Time 3:30 Heparin . Access Left [place] catheter, RUE AVF 1800 units IVP then 400 units/hr BFR 400 DFR 800    Micera 150 mcg IVP every 2 weeks, Venofer 50 mg IV TTS, calcitriol 0.5 mcg TTS   Assessment/Plan:   Presumed seizure - EEG negative, now on long-term EEG monitoring.  Neuro following. ESRD - normally TTS (but is noncompliant and usually only goes 2 days/week.  S/p emergent HD on 07/26/21.  Will plan for HD tomorrow to keep on his outpatient schedule. Hyperkalemia - resolved with HD. Paroxysmal atrial fibrillation - currently in NSR.  Continue Eliquis and amiodarone. DM type 2 HTN - stable Chronic diastolic CHF - improved with HD and UF. Anemia of ESRD - follow H/H and continue with ESA. 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  Donetta Potts, MD [facility]

## 2021-07-29 NOTE — Progress Notes (Signed)
PROGRESS NOTE   [name].  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  CPR was performed for 1 minute patient was placed on nonrebreather--he awoke pretty rapidly and had STEMI emergent HD  Hospital-Problem based course  Presumed seizure in the setting of multiple prior strokes Transferred to [facility] for continuous EEG-EEG showed no epileptiform discharges Ativan 4 mg IV every 5 minutes as needed for seizure Klonopin only prn --Flexeril has been held according to epileptologist--stop depakote Patient will discharge home on Keppra twice daily with 250 after dialysis-recommend intranasal Valtoco if seizure lasts more than 5 minutes Prior multiple CVAs Continue Eliquis 2.5 twice daily, Plavix 75 daily Secondary preventive measures in addition + Lipitor 80 Permanent A-fib CHADVASC >4 Continue amiodarone 200 daily, Coreg 25 twice daily, clonidine 0.1 3 times daily as needed blood pressure >200 ESRD TTS [facility] Defer to nephrology-further planning as per them-check iron studies as well as phosphorus in a.m. Resume Pohslo as per renal Resume Avapro 150 daily as per nephrology DM TY 2 with gastroparesis and multiple surgeries in the past CBGs ranging 70-1 25-continue  sliding scale at this time resistant coverage Does not appear to be on any home meds? Left eye blindness Will need evaluation by therapy services and need to ensure is seizure-free in the next 24-48    DVT prophylaxis: Eliquis 2.5 twice daily Code Status: Full Family Communication: None Disposition:  Status is: Inpatient Remains inpatient appropriate because: Work-up and input from neurology is pending   Consultants:  Neurologist  Procedures: LT EEG  Antimicrobials:    Subjective: Having some cough today no distress no fever no chills no nausea no vomiting  Objective: Vitals:   07/28/21 1959 07/29/21 0000 07/29/21 0400 07/29/21 0817  BP: 113/70 139/74 (!) 164/76 (!) 140/108  Pulse: 69 68 66 66  Resp: 18 18 18 18   Temp:  98.4 F (36.9 C) 98.4 F (36.9 C) 98.7 F (37.1 C)  TempSrc:  Oral Oral Oral  SpO2: 97% 98% 97% 98%  Weight:      Height:       No intake or output data in the 24 hours ending 07/29/21 0946 Filed Weights   07/27/21 2308 07/28/21 1158 07/28/21 1556  Weight: 90.7 kg 89.2 kg 88.4 kg    Examination:  Left eye is patched EOMI to right side Moving 4 limbs equally Chest clear S1-S2 no murmur Abdomen soft no rebound Long-term EEG in process  Data Reviewed: personally reviewed   CBC    Component Value Date/Time   WBC 9.9 07/29/2021 0524   RBC 3.88 (L) 07/29/2021 0524   HGB 8.4 (L) 07/29/2021 0524   HCT 27.8 (L) 07/29/2021 0524   PLT 111 (L) 07/29/2021 0524   MCV 71.6 (L) 07/29/2021 0524   MCH 21.6 (L) 07/29/2021 0524   MCHC 30.2 07/29/2021 0524   RDW 21.2 (  H) 07/29/2021 0524   LYMPHSABS 1.4 07/26/2021 1415   MONOABS 0.9 07/26/2021 1415   EOSABS 0.6 (H) 07/26/2021 1415   BASOSABS 0.1 07/26/2021 1415      Latest Ref Rng & Units 07/29/2021    5:24 AM 14-Aug-2021    3:07 AM 07/27/2021    4:36 AM  CMP  Glucose 70 - 99 mg/dL 90   69   73    BUN 6 - 20 mg/dL 42   64   49    Creatinine 0.61 - 1.24 mg/dL 7.29   9.68   7.28    Sodium 135 - 145  mmol/L 134   137   137    Potassium 3.5 - 5.1 mmol/L 4.0   5.2   3.9    Chloride 98 - 111 mmol/L 94   101   100    CO2 22 - 32 mmol/L 26   24   24     Calcium 8.9 - 10.3 mg/dL 8.6   8.2   8.4       Radiology Studies: EEG adult  Result Date: Aug 14, 2021 Lora Havens, MD     07/29/2021  9:13 AM Patient Name: [name]. MRN: [id number] Epilepsy Attending: Lora Havens Referring Physician/Provider: Lora Havens, MD Date: 2021/08/14 Duration: 21.14 mins Patient history:  51 year old male with end-stage renal disease: HD, episodes of muscle jerking on Klonopin who presented with seizure-like episodes in the setting of missing dialysis.  EEG to evaluate for seizure. Level of alertness: Awake, asleep AEDs during EEG study: VPA, Clonazepam Technical aspects: This EEG study was done with scalp electrodes positioned according to the 10-20 International system of electrode placement. Electrical activity was acquired at a sampling rate of 500Hz  and reviewed with a high frequency filter of 70Hz  and a low frequency filter of 1Hz . EEG data were recorded continuously and digitally stored. Description: The posterior dominant rhythm consists of 9 Hz activity of moderate voltage (25-35 uV) seen predominantly in posterior head regions, symmetric and reactive to eye opening and eye closing. Sleep was characterized by vertex waves, sleep spindles (12 to 14 Hz), maximal frontocentral region. Hyperventilation and photic stimulation were not performed.   IMPRESSION: This study is within normal limits. No seizures or epileptiform discharges were seen throughout the recording. Priyanka [name]   Overnight EEG with video  Result Date: 07/29/2021 Lora Havens, MD     07/29/2021  9:43 AM Patient Name: [name]. MRN: [id number] Epilepsy Attending: Lora Havens Referring Physician/Provider: Lora Havens, MD Duration: 08-14-2021 1130 to 07/29/2021 0945  Patient history:  51 year old male with end-stage  renal disease: HD, episodes of muscle jerking on Klonopin who presented with seizure-like episodes in the setting of missing dialysis.  EEG to evaluate for seizure.  Level of alertness: Awake, asleep  AEDs during EEG study: None  Technical aspects: This EEG study was done with scalp electrodes positioned according to the 10-20 International system of electrode placement. Electrical activity was acquired at a sampling rate of 500Hz  and reviewed with a high frequency filter of 70Hz  and a low frequency filter of 1Hz . EEG data were recorded continuously and digitally stored.  Description: The posterior dominant rhythm consists of 9 Hz activity of moderate voltage (25-35 uV) seen predominantly in posterior head regions, symmetric and reactive to eye opening and eye closing. Sleep was characterized by vertex waves, sleep spindles (12 to 14 Hz), maximal frontocentral region. Hyperventilation and photic stimulation were not performed.  IMPRESSION: This study is within normal limits. No seizures or epileptiform discharges were seen throughout the recording.  Priyanka [name]      Scheduled Meds:  amiodarone  200 mg Oral Daily   apixaban  2.5 mg Oral BID   atorvastatin  80 mg Oral Daily   calcium acetate  1,334 mg Oral TID WC   carvedilol  25 mg Oral BID   Chlorhexidine Gluconate Cloth  6 each Topical Q0600   clopidogrel  75 mg Oral Daily   [START ON 08/04/2021] darbepoetin (ARANESP) injection - DIALYSIS  60 mcg Intravenous Q Tue-HD   dorzolamide-timolol  1 drop Right Eye BID   erythromycin   Right Eye QHS   insulin aspart  0-20 Units Subcutaneous TID WC   mouth rinse  15 mL Mouth Rinse BID   neomycin-polymyxin b-dexamethasone  1 application. Left Eye QHS   tamsulosin  0.4 mg Oral Daily   Continuous Infusions:   LOS: 3 days   Time spent: 73  Nita Sells, MD [facility] To contact the attending provider between 7A-7P or the covering provider during after hours 7P-7A, please log into  the web site [email or web address] and access using universal [place] password for that web site. If you do not have the password, please call the hospital operator.  07/29/2021, 9:46 AM

## 2021-07-29 NOTE — Care Management Important Message (Signed)
Important Message  Patient Details  Name: [name]. MRN: [id number] Date of Birth: Feb 08, 1971   Medicare Important Message Given:  Yes     Delaney Schnick 07/29/2021, 3:20 PM

## 2021-07-29 NOTE — Progress Notes (Signed)
LTM maint complete - no skin breakdown . EEG on server. [facility] monitored,

## 2021-07-29 NOTE — Progress Notes (Addendum)
Subjective: No acute events overnight.  Per wife at bedside, patient is close to baseline now.  Denies any further jerking.  Patient reports some pain due to right tongue bite.  Denies any new concerns.  ROS: negative except above Examination  Vital signs in last 24 hours: Temp:  [97.8 F (36.6 C)-98.7 F (37.1 C)] 98.7 F (37.1 C) (06/07 0817) Pulse Rate:  [59-71] 66 (06/07 0817) Resp:  [11-20] 18 (06/07 0817) BP: (113-187)/(70-108) 140/108 (06/07 0817) SpO2:  [97 %-100 %] 98 % (06/07 0817) Weight:  [88.4 kg-89.2 kg] 88.4 kg (06/06 1556)  General: lying in bed, NAD Neuro:  AOx3, cranial nerves II to XII appear grossly intact except dressing on left eyes unable to examine, no apparent facial asymmetry, 5/5 in all 4 extremities, FTN intact bilaterally,    Basic Metabolic Panel: Recent Labs  Lab 07/26/21 1415 07/26/21 1638 07/26/21 1934 07/26/21 2358 07/27/21 0436 07/28/21 0307 07/29/21 0524  NA 139  --   --   --  137 137 134*  K 6.5*   < > 6.0* 6.0* 3.9 5.2* 4.0  CL 108  --   --   --  100 101 94*  CO2 18*  --   --   --  24 24 26   GLUCOSE 70  --   --   --  73 69* 90  BUN 79*  --   --   --  49* 64* 42*  CREATININE 11.07*  --   --   --  7.28* 9.68* 7.29*  CALCIUM 8.4*  --   --   --  8.4* 8.2* 8.6*  MG 1.9  --   --   --   --   --  1.7  PHOS  --   --   --   --  7.5*  --   --    < > = values in this interval not displayed.    CBC: Recent Labs  Lab 07/26/21 1415 07/27/21 0436 07/28/21 0307 07/29/21 0524  WBC 8.4 16.9* 14.5* 9.9  NEUTROABS 5.5  --   --   --   HGB 10.0* 10.4* 8.6* 8.4*  HCT 33.7* 33.8* 28.7* 27.8*  MCV 73.1* 71.5* 72.5* 71.6*  PLT 153 152 113* 111*     Coagulation Studies: No results for input(s): LABPROT, INR in the last 72 hours.  Imaging No new brain imaging overnight     ASSESSMENT AND PLAN: 51 year old male with end-stage renal disease: HD, episodes of muscle jerking on Klonopin who presented with seizure-like episodes in the setting of  missing dialysis.   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  It is possible that patient also has myoclonus at times which can be more noticeable if he misses dialysis.   Recommendations: -Discontinue video EEG as no seizures overnight -Discussed about starting Keppra 500 mg twice daily as well as an additional dose of 250 mg after dialysis.  Patient and wife in agreement. -Discussed about holding daily Klonopin.  Would recommend 0.5 mg once daily as needed for muscle jerking.  Discussed with wife that if patient is needing it frequently, please discuss during next neurology appointment and we could consider either adding a different AED (Depakote) or scheduling Klonopin -Also recommend intranasal Valtoco 10 mg in each nostril (total 20 mg) to be taken for generalized tonic-clonic seizure lasting more  than 5 minutes.  Can be repeated once after 4 hours.  Do not take more than twice in 24 hours. -Discontinue Flexeril as per wife it was started for muscle jerking -Discussed seizure provoking factors including medication noncompliance, lack of sleep, stress, infection, alcohol use -Discussed medication side effects -Discussed the importance of compliance with hemodialysis -Continue seizure precautions, patient does not drive -IV Ativan 2 mg for clinical seizure-like activity -Discussed plan with patient, wife at bedside as well as Dr. Verlon Au  Seizure precautions:  Use caution when using heavy equipment or power tools. Avoid working on ladders or at heights. Take showers instead of baths. Ensure the water temperature is not too high on the home water heater. Do not go swimming alone. Do not lock yourself in a room alone (i.e. bathroom). When caring for infants or small children, sit down when holding, feeding, or changing them to  minimize risk of injury to the child in the event you have a seizure. Maintain good sleep hygiene. Avoid alcohol.    If patient has another seizure, call 911 and bring them back to the ED if: A.  The seizure lasts longer than 5 minutes.      B.  The patient doesn't wake shortly after the seizure or has new problems such as difficulty seeing, speaking or moving following the seizure C.  The patient was injured during the seizure D.  The patient has a temperature over 102 F (39C) E.  The patient vomited during the seizure and now is having trouble breathing    During the Seizure   - First, ensure adequate ventilation and place patients on the floor on their left side  Loosen clothing around the neck and ensure the airway is patent. If the patient is clenching the teeth, do not force the mouth open with any object as this can cause severe damage - Remove all items from the surrounding that can be hazardous. The patient may be oblivious to what's happening and may not even know what he or she is doing. If the patient is confused and wandering, either gently guide him/her away and block access to outside areas - Reassure the individual and be comforting - Call 911. In most cases, the seizure ends before EMS arrives. However, there are cases when seizures may last over 3 to 5 minutes. Or the individual may have developed breathing difficulties or severe injuries. If a pregnant patient or a person with diabetes develops a seizure, it is prudent to call an ambulance. - Finally, if the patient does not regain full consciousness, then call EMS. Most patients will remain confused for about 45 to 90 minutes after a seizure, so you must use judgment in calling for help.    After the Seizure (Postictal Stage)   After a seizure, most patients experience confusion, fatigue, muscle pain and/or a headache. Thus, one should permit the individual to sleep. For the next few days, reassurance is essential. Being calm  and helping reorient the person is also of importance.   Most seizures are painless and end spontaneously. Seizures are not harmful to others but can lead to complications such as stress on the lungs, brain and the heart. Individuals with prior lung problems may develop labored breathing and respiratory distress.     I have spent a total of 45 minutes with the patient reviewing hospital notes,  test results, labs and examining the patient as well as establishing an assessment and plan that was discussed personally with the  patient.  > 50% of time was spent in direct patient care.   Zeb Comfort Epilepsy [facility] For questions after 5pm please refer to AMION to reach the Neurologist on call

## 2021-07-29 NOTE — TOC Benefit Eligibility Note (Signed)
Patient Teacher, English as a foreign language completed.    The patient is currently admitted and upon discharge could be taking Valtoco 10 mg dose 10 mg/0.1 ml liqd.  The current 30 day co-pay is, $233.39.   The patient is insured through [employer] part D     Lyndel Safe, [place] Patient Advocate Specialist [facility] Patient Advocate Team Direct Number: [phone]  Fax: [phone]

## 2021-07-29 NOTE — Procedures (Addendum)
Patient Name: [name].  MRN: [id number]  Epilepsy Attending: Lora Havens  Referring Physician/Provider: Lora Havens, MD Duration: 07/28/2021 1130 to 07/29/2021 1130   Patient history:  51 year old male with end-stage renal disease: HD, episodes of muscle jerking on Klonopin who presented with seizure-like episodes in the setting of missing dialysis.  EEG to evaluate for seizure.   Level of alertness: Awake, asleep   AEDs during EEG study: None   Technical aspects: This EEG study was done with scalp electrodes positioned according to the 10-20 International system of electrode placement. Electrical activity was acquired at a sampling rate of 500Hz  and reviewed with a high frequency filter of 70Hz  and a low frequency filter of 1Hz . EEG data were recorded continuously and digitally stored.    Description: The posterior dominant rhythm consists of 9 Hz activity of moderate voltage (25-35 uV) seen predominantly in posterior head regions, symmetric and reactive to eye opening and eye closing. Sleep was characterized by vertex waves, sleep spindles (12 to 14 Hz), maximal frontocentral region. Hyperventilation and photic stimulation were not performed.      IMPRESSION: This study is within normal limits. No seizures or epileptiform discharges were seen throughout the recording.   Sharissa Brierley [name]

## 2021-07-29 NOTE — Procedures (Signed)
Patient Name: [name].  MRN: [id number]  Epilepsy Attending: Lora Havens  Referring Physician/Provider: Lora Havens, MD Duration: 07/29/2021 1130 to 07/29/2021 1447   Patient history:  50 year old male with end-stage renal disease: HD, episodes of muscle jerking on Klonopin who presented with seizure-like episodes in the setting of missing dialysis.  EEG to evaluate for seizure.   Level of alertness: Awake   AEDs during EEG study: None   Technical aspects: This EEG study was done with scalp electrodes positioned according to the 10-20 International system of electrode placement. Electrical activity was acquired at a sampling rate of 500Hz  and reviewed with a high frequency filter of 70Hz  and a low frequency filter of 1Hz . EEG data were recorded continuously and digitally stored.    Description: The posterior dominant rhythm consists of 9 Hz activity of moderate voltage (25-35 uV) seen predominantly in posterior head regions, symmetric and reactive to eye opening and eye closing. Hyperventilation and photic stimulation were not performed.      IMPRESSION: This study is within normal limits. No seizures or epileptiform discharges were seen throughout the recording.   [name] [name]

## 2021-07-30 ENCOUNTER — Ambulatory Visit: Admission: RE | Admit: 2021-07-30 | Payer: Medicare HMO | Source: Home / Self Care | Admitting: Vascular Surgery

## 2021-07-30 ENCOUNTER — Encounter: Admission: RE | Payer: Self-pay | Source: Home / Self Care

## 2021-07-30 DIAGNOSIS — R569 Unspecified convulsions: Secondary | ICD-10-CM | POA: Diagnosis not present

## 2021-07-30 LAB — CBC
HCT: 28.8 % — ABNORMAL LOW (ref 39.0–52.0)
Hemoglobin: 8.4 g/dL — ABNORMAL LOW (ref 13.0–17.0)
MCH: 21.4 pg — ABNORMAL LOW (ref 26.0–34.0)
MCHC: 29.2 g/dL — ABNORMAL LOW (ref 30.0–36.0)
MCV: 73.5 fL — ABNORMAL LOW (ref 80.0–100.0)
Platelets: 132 10*3/uL — ABNORMAL LOW (ref 150–400)
RBC: 3.92 MIL/uL — ABNORMAL LOW (ref 4.22–5.81)
RDW: 21.2 % — ABNORMAL HIGH (ref 11.5–15.5)
WBC: 8.4 10*3/uL (ref 4.0–10.5)
nRBC: 0 % (ref 0.0–0.2)

## 2021-07-30 LAB — RENAL FUNCTION PANEL
Albumin: 2.4 g/dL — ABNORMAL LOW (ref 3.5–5.0)
Anion gap: 15 (ref 5–15)
BUN: 60 mg/dL — ABNORMAL HIGH (ref 6–20)
CO2: 25 mmol/L (ref 22–32)
Calcium: 8.5 mg/dL — ABNORMAL LOW (ref 8.9–10.3)
Chloride: 95 mmol/L — ABNORMAL LOW (ref 98–111)
Creatinine, Ser: 9.77 mg/dL — ABNORMAL HIGH (ref 0.61–1.24)
GFR, Estimated: 6 mL/min — ABNORMAL LOW (ref >60)
Glucose, Bld: 81 mg/dL (ref 70–99)
Phosphorus: 8.9 mg/dL — ABNORMAL HIGH (ref 2.5–4.6)
Potassium: 4.7 mmol/L (ref 3.5–5.1)
Sodium: 135 mmol/L (ref 135–145)

## 2021-07-30 LAB — VITAMIN B12: Vitamin B-12: 1986 pg/mL — ABNORMAL HIGH (ref 180–914)

## 2021-07-30 LAB — GLUCOSE, CAPILLARY
Glucose-Capillary: 69 mg/dL — ABNORMAL LOW (ref 70–99)
Glucose-Capillary: 82 mg/dL (ref 70–99)
Glucose-Capillary: 87 mg/dL (ref 70–99)
Glucose-Capillary: 68 mg/dL — ABNORMAL LOW (ref 70–99)

## 2021-07-30 LAB — RETICULOCYTES
Immature Retic Fract: 17.3 % — ABNORMAL HIGH (ref 2.3–15.9)
RBC.: 3.92 MIL/uL — ABNORMAL LOW (ref 4.22–5.81)
Retic Count, Absolute: 34.9 10*3/uL (ref 19.0–186.0)
Retic Ct Pct: 0.9 % (ref 0.4–3.1)

## 2021-07-30 LAB — IRON AND TIBC
Iron: 17 ug/dL — ABNORMAL LOW (ref 45–182)
Saturation Ratios: 13 % — ABNORMAL LOW (ref 17.9–39.5)
TIBC: 133 ug/dL — ABNORMAL LOW (ref 250–450)
UIBC: 116 ug/dL

## 2021-07-30 LAB — FOLATE: Folate: 6.1 ng/mL (ref >5.9)

## 2021-07-30 LAB — FERRITIN: Ferritin: 656 ng/mL — ABNORMAL HIGH (ref 24–336)

## 2021-07-30 SURGERY — ARTERIOVENOUS (AV) FISTULA CREATION
Anesthesia: General | Laterality: Left

## 2021-07-30 MED ORDER — HEPARIN SODIUM (PORCINE) 1000 UNIT/ML DIALYSIS
20.0000 [IU]/kg | INTRAMUSCULAR | Status: DC | PRN
  Filled 2021-07-30: qty 2

## 2021-07-30 MED ORDER — HEPARIN SODIUM (PORCINE) 1000 UNIT/ML IJ SOLN
INTRAMUSCULAR | Status: AC
  Filled 2021-07-30: qty 2

## 2021-07-30 MED ORDER — LEVETIRACETAM 500 MG PO TABS: 500.0000 mg | ORAL_TABLET | Freq: Two times a day (BID) | ORAL | 2 refills | Status: DC

## 2021-07-30 MED ORDER — ELIQUIS 2.5 MG PO TABS: 5.0000 mg | ORAL_TABLET | Freq: Two times a day (BID) | ORAL | 3 refills | Status: DC

## 2021-07-30 MED ORDER — CLONAZEPAM 0.5 MG PO TBDP: 0.5000 mg | ORAL_TABLET | Freq: Every day | ORAL | 0 refills | Status: DC | PRN

## 2021-07-30 MED ORDER — LEVETIRACETAM 250 MG PO TABS: 250.0000 mg | ORAL_TABLET | ORAL | 1 refills | Status: DC

## 2021-07-30 NOTE — Discharge Summary (Addendum)
Physician Discharge Summary  [name] ACZ:[id number] DOB: Sep 07, 1970 DOA: 07/26/2021  PCP: [name], MD  Admit date: 07/26/2021 Discharge date: 07/30/2021  Time spent: 47 minutes  Recommendations for Outpatient Follow-up:  Will need outpatient follow-up with PCP and adjustment of meds as per him Continue ESRD with [facility] dialysis-at next visit needs phosphorus level as well as anemia panel and monitoring per nephrologist Depakote as well as Flexeril stopped this admission-Keppra 500 twice daily now and then Keppra 250 after HD days-clonazepam to be used for rescue or in case of seizure Consider A1c and lipid panel in the outpatient setting  Discharge Diagnoses:  MAIN problem for hospitalization   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  Please see below for itemized issues addressed in HOpsital- refer to other progress notes for clarity if needed  Discharge Condition: Fair  Diet recommendation: Renal  Filed Weights   07/28/21 1158 07/28/21 1556 07/30/21 0836  Weight: 89.2 kg 88.4 kg 86.9 kg    History of present illness:  51 year old black male ESRD TTS [facility] complicated by steal syndrome in the past--missed HD DM TY 2+ prior PAD 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  CPR was performed for 1 minute patient was placed on nonrebreather--he awoke pretty rapidly and had STEMI emergent HD  Hospital Course:  Presumed seizure in the setting of  multiple prior strokes Transferred to [facility] for continuous EEG-EEG showed no epileptiform discharges Ativan 4 mg IV every 5 minutes as needed for seizure Klonopin only prn --Flexeril has been held according to epileptologist--stop depakote Patient will discharge home on Keppra twice daily with 250 after dialysis-recommend intranasal Valtoco if seizure lasts more than 5 minutes but this was not affordable unfortunately and was over 2 $50 therefore patient was placed on disintegrated bowel alprazolam on discharge and this was given to him as a prescription Status post 1 minute CPR in the setting of NSTEMI went unresponsive in ER Patient has some muscle wall pain and cough-given Hycodan syrup during hospital stay and has recovered-none needed on discharge Prior multiple CVAs Continue Eliquis 5 twice daily [was on lower dose 2.5 bid for unclear reason], Plavix 75 daily Secondary preventive measures in addition + Lipitor 80 Permanent A-fib CHADVASC >4 Continue amiodarone 200 daily, Coreg 25 twice daily, clonidine 0.1 x 3 times daily as needed blood pressure >200 ESRD TTS [facility] Defer to nephrology-further planning as per them-check iron studies as well as phosphorus as an outpatient. Resume Pohslo as per renal Resume Avapro 150 daily as per nephrology--it appears that this was stopped DM TY 2 with gastroparesis and multiple surgeries in the past CBGs ranging 70-1 25-continue sliding scale at this time resistant coverage Does not appear to be on any home meds? He had some hypoglycemia during hospitalization Left eye blindness Will need evaluation byop  Procedures:  (i.e. Studies not automatically included, echos, thoracentesis, etc; not x-rays)  Consultations: Nephrology Neurology  Discharge Exam: Vitals:   07/30/21 0850 07/30/21 0905  BP: (!) 150/74 (!) 171/87  Pulse: 60 61  Resp: 12 12  Temp:    SpO2:      Subj on day of d/c   Awake coherent pleasant no distress seen on HD  unit  General Exam on discharge  EOMI NCAT does have patching to the left eye no focal deficit rales rhonchi wheeze Chest is clinically clear no added sound Has left-sided access Abdomen is soft nontender no rebound no guarding No lower extremity edema ROM is intact  Discharge Instructions   Discharge Instructions     Diet - low sodium heart healthy   Complete by: As directed    Discharge instructions   Complete by: As directed    Stop depakote.  Make sure you look at medications carefully as some have changed you will need to be on Keppra twice a day and take a dose of Keppra  250 after dialysis on dialysis days For seizures or myoclonus with breakthrough you may take the clonazepam under the tongue Please continue your other medications as recommended by neurology. Get routine lab work done as an outpatient and iron tests as well as others You will need to continue your usual blood thinners-make sure you notify your physician if you have any bleeding from the rectum from the nose Best of luck have a good summer please follow-up with your usual physicians   Increase activity slowly   Complete by: As directed       Allergies as of 07/30/2021       Reactions   Levofloxacin Swelling   Facial swelling Other reaction(s): swelling   Promethazine Diarrhea, Other (See Comments)   Muscle cramps   Promethazine Hcl Other (See Comments)   Cramping all over Other reaction(s): cramp all over   Other    Other reaction(s): Unknown Other reaction(s): Unknown   Malt Other (See Comments)   Cramping        Medication List     STOP taking these medications    cyclobenzaprine 10 MG tablet Commonly known as: FLEXERIL   irbesartan 150 MG tablet Commonly known as: AVAPRO       TAKE these medications    acetaminophen 325 MG tablet Commonly known as: TYLENOL Take 1-2 tablets (325-650 mg total) by mouth every 4 (four) hours as needed for mild pain.   amiodarone 200 MG  tablet Commonly known as: PACERONE TAKE 1 TABLET BY MOUTH ONCE DAILY.   atorvastatin 80 MG tablet Commonly known as: LIPITOR Take 1 tablet (80 mg total) by mouth daily.   calcium acetate 667 MG capsule Commonly known as: PHOSLO Take 2 capsules (1,334 mg total) by mouth 3 (three) times daily with meals.   carvedilol 25 MG tablet Commonly known as: COREG Take 25 mg by mouth 2 (two) times daily.   clonazePAM 0.5 MG disintegrating tablet Commonly known as: KLONOPIN Take 1 tablet (0.5 mg total) by mouth daily as needed for seizure (for seizures). What changed:  how much to take when to take this reasons to take this Another medication with the same name was removed. Continue taking this medication, and follow the directions you see here.   cloNIDine 0.1 MG tablet Commonly known as: CATAPRES Take 1 tablet (0.1 mg total) by mouth 3 (three) times daily as needed (systolic blood pressure (top number) is Greater than 200 or DBP (lower number)is Greater than 110).   clopidogrel 75 MG tablet Commonly known as: PLAVIX Take 1 tablet (75 mg total) by mouth daily.   dorzolamide-timolol 22.3-6.8 MG/ML ophthalmic solution Commonly known as: COSOPT Place 1 drop  into the right eye 2 (two) times daily.   Eliquis 2.5 MG Tabs tablet Generic drug: apixaban Take 2.5 mg by mouth 2 (two) times daily.   erythromycin ophthalmic ointment SMARTSIG:In Eye(s)   hydrocortisone 2.5 % cream Place rectally.   lactobacillus Pack Take 1 packet (1 g total) by mouth 3 (three) times daily with meals.   levETIRAcetam 500 MG tablet Commonly known as: KEPPRA Take 1 tablet (500 mg total) by mouth 2 (two) times daily.   levETIRAcetam 250 MG tablet Commonly known as: KEPPRA Take 1 tablet (250 mg total) by mouth Every Tuesday,Thursday,and Saturday with dialysis.   melatonin 5 MG Tabs Take 1 tablet (5 mg total) by mouth at bedtime as needed.   neomycin-polymyxin b-dexamethasone 3.5-10000-0.1  Oint Commonly known as: MAXITROL Place 1 application. into the left eye in the morning and at bedtime.   nitroGLYCERIN 0.4 MG SL tablet Commonly known as: NITROSTAT Place 1 tablet (0.4 mg total) under the tongue every 5 (five) minutes as needed for chest pain.   phenylephrine 0.25 % suppository Commonly known as: (USE for PREPARATION-H) Place 1 suppository rectally 2 (two) times daily as needed for hemorrhoids.   prednisoLONE acetate 1 % ophthalmic suspension Commonly known as: PRED FORTE Place 1 drop into the left eye every 4 (four) hours.   Preparation H 0.25-88.44 % suppository Generic drug: shark liver oil-cocoa butter Place rectally.   tamsulosin 0.4 MG Caps capsule Commonly known as: FLOMAX Take 0.4 mg by mouth daily.   witch hazel-glycerin pad Commonly known as: TUCKS Apply topically as needed for itching.       Allergies  Allergen Reactions   Levofloxacin Swelling    Facial swelling Other reaction(s): swelling   Promethazine Diarrhea and Other (See Comments)    Muscle cramps   Promethazine Hcl Other (See Comments)    Cramping all over  Other reaction(s): cramp all over   Other     Other reaction(s): Unknown Other reaction(s): Unknown   Malt Other (See Comments)    Cramping      The results of significant diagnostics from this hospitalization (including imaging, microbiology, ancillary and laboratory) are listed below for reference.    Significant Diagnostic Studies: Overnight EEG with video  Result Date: 07/29/2021 Lora Havens, MD     07/29/2021  2:06 PM Patient Name: [name] MRN: [id number] Epilepsy Attending: Lora Havens Referring Physician/Provider: Lora Havens, MD Duration: 07/28/2021 1130 to 07/29/2021 1130  Patient history:  51 year old male with end-stage renal disease: HD, episodes of muscle jerking on Klonopin who presented with seizure-like episodes in the setting of missing dialysis.  EEG to evaluate for seizure.  Level  of alertness: Awake, asleep  AEDs during EEG study: None  Technical aspects: This EEG study was done with scalp electrodes positioned according to the 10-20 International system of electrode placement. Electrical activity was acquired at a sampling rate of 500Hz  and reviewed with a high frequency filter of 70Hz  and a low frequency filter of 1Hz . EEG data were recorded continuously and digitally stored.  Description: The posterior dominant rhythm consists of 9 Hz activity of moderate voltage (25-35 uV) seen predominantly in posterior head regions, symmetric and reactive to eye opening and eye closing. Sleep was characterized by vertex waves, sleep spindles (12 to 14 Hz), maximal frontocentral region. Hyperventilation and photic stimulation were not performed.    IMPRESSION: This study is within normal limits. No seizures or epileptiform discharges were seen throughout the recording.  Priyanka O  Yadav    EEG adult  Result Date: 07/28/2021 Lora Havens, MD     07/29/2021  9:13 AM Patient Name: Winford Hehn. MRN: [id number] Epilepsy Attending: Lora Havens Referring Physician/Provider: Lora Havens, MD Date: 07/28/2021 Duration: 21.14 mins Patient history:  52 year old male with end-stage renal disease: HD, episodes of muscle jerking on Klonopin who presented with seizure-like episodes in the setting of missing dialysis.  EEG to evaluate for seizure. Level of alertness: Awake, asleep AEDs during EEG study: VPA, Clonazepam Technical aspects: This EEG study was done with scalp electrodes positioned according to the 10-20 International system of electrode placement. Electrical activity was acquired at a sampling rate of 500Hz  and reviewed with a high frequency filter of 70Hz  and a low frequency filter of 1Hz . EEG data were recorded continuously and digitally stored. Description: The posterior dominant rhythm consists of 9 Hz activity of moderate voltage (25-35 uV) seen predominantly in posterior head  regions, symmetric and reactive to eye opening and eye closing. Sleep was characterized by vertex waves, sleep spindles (12 to 14 Hz), maximal frontocentral region. Hyperventilation and photic stimulation were not performed.   IMPRESSION: This study is within normal limits. No seizures or epileptiform discharges were seen throughout the recording. Lora Havens   Portable Chest xray  Result Date: 07/27/2021 CLINICAL DATA:  CHF. EXAM: PORTABLE CHEST 1 VIEW COMPARISON:  07/26/2021 FINDINGS: Left chest wall dialysis catheter is identified with tips in the cavoatrial junction. Stable cardiac enlargement. No signs of pleural effusion or edema. No airspace opacities. IMPRESSION: 1. Stable cardiac enlargement. 2. No heart failure. Electronically Signed   By: Kerby Moors M.D.   On: 07/27/2021 05:37   CT Head Wo Contrast  Result Date: 07/26/2021 CLINICAL DATA:  Mental status change, unknown cause EXAM: CT HEAD WITHOUT CONTRAST TECHNIQUE: Contiguous axial images were obtained from the base of the skull through the vertex without intravenous contrast. RADIATION DOSE REDUCTION: This exam was performed according to the departmental dose-optimization program which includes automated exposure control, adjustment of the mA and/or kV according to patient size and/or use of iterative reconstruction technique. COMPARISON:  Same day CT; CT dated Jun 23, 2021 FINDINGS: Brain: No evidence of acute infarction, hemorrhage, hydrocephalus, extra-axial collection or mass lesion/mass effect. Periventricular white matter hypodensities consistent with sequela of chronic microvascular ischemic disease. Vascular: Severe vascular calcifications. Skull: Normal. Negative for fracture or focal lesion. Sinuses/Orbits: Postsurgical changes are noted in the LEFT orbit with soft tissue air in the curvilinear prosthesis. Other: Similar soft tissue thickening along the posterior scalp. IMPRESSION: No acute intracranial abnormality. Electronically  Signed   By: Valentino Saxon M.D.   On: 07/26/2021 18:36   CT Head Wo Contrast  Result Date: 07/26/2021 CLINICAL DATA:  Head trauma, moderate-severe; Neck trauma, dangerous injury mechanism (Age 17-64y) EXAM: CT HEAD WITHOUT CONTRAST CT CERVICAL SPINE WITHOUT CONTRAST TECHNIQUE: Multidetector CT imaging of the head and cervical spine was performed following the standard protocol without intravenous contrast. Multiplanar CT image reconstructions of the cervical spine were also generated. RADIATION DOSE REDUCTION: This exam was performed according to the departmental dose-optimization program which includes automated exposure control, adjustment of the mA and/or kV according to patient size and/or use of iterative reconstruction technique. COMPARISON:  CT dated June 16, 2021 FINDINGS: CT HEAD FINDINGS Brain: No evidence of acute infarction, hemorrhage, hydrocephalus, extra-axial collection or mass lesion/mass effect. Periventricular white matter hypodensities consistent with sequela of chronic microvascular ischemic disease. Vascular: Vascular calcifications. Skull: Normal. Negative for  fracture or focal lesion. Sinuses/Orbits: Postsurgical changes within the LEFT orbit with small amount of air and curvilinear likely prosthesis. Other: None CT CERVICAL SPINE FINDINGS Evaluation limited secondary to motion. Alignment: Straightening of the cervical lordosis. No acute spondylolisthesis. Skull base and vertebrae: No definitive acute fracture. Soft tissues and spinal canal: No prevertebral fluid or swelling. No visible canal hematoma. Disc levels: Moderate intervertebral disc space height loss with uncovertebral hypertrophy at C3-4. Mild multilevel intervertebral disc space height loss of the rest of the cervical spine. Upper chest: Diffuse vascular calcifications. Partial visualization of a LEFT chest CVC. Other: None IMPRESSION: 1.  No acute intracranial abnormality. 2. Postsurgical changes of the LEFT orbit. 3.   No acute fracture or static subluxation of the cervical spine. Electronically Signed   By: Valentino Saxon M.D.   On: 07/26/2021 13:39   CT Cervical Spine Wo Contrast  Result Date: 07/26/2021 CLINICAL DATA:  Head trauma, moderate-severe; Neck trauma, dangerous injury mechanism (Age 54-64y) EXAM: CT HEAD WITHOUT CONTRAST CT CERVICAL SPINE WITHOUT CONTRAST TECHNIQUE: Multidetector CT imaging of the head and cervical spine was performed following the standard protocol without intravenous contrast. Multiplanar CT image reconstructions of the cervical spine were also generated. RADIATION DOSE REDUCTION: This exam was performed according to the departmental dose-optimization program which includes automated exposure control, adjustment of the mA and/or kV according to patient size and/or use of iterative reconstruction technique. COMPARISON:  CT dated June 16, 2021 FINDINGS: CT HEAD FINDINGS Brain: No evidence of acute infarction, hemorrhage, hydrocephalus, extra-axial collection or mass lesion/mass effect. Periventricular white matter hypodensities consistent with sequela of chronic microvascular ischemic disease. Vascular: Vascular calcifications. Skull: Normal. Negative for fracture or focal lesion. Sinuses/Orbits: Postsurgical changes within the LEFT orbit with small amount of air and curvilinear likely prosthesis. Other: None CT CERVICAL SPINE FINDINGS Evaluation limited secondary to motion. Alignment: Straightening of the cervical lordosis. No acute spondylolisthesis. Skull base and vertebrae: No definitive acute fracture. Soft tissues and spinal canal: No prevertebral fluid or swelling. No visible canal hematoma. Disc levels: Moderate intervertebral disc space height loss with uncovertebral hypertrophy at C3-4. Mild multilevel intervertebral disc space height loss of the rest of the cervical spine. Upper chest: Diffuse vascular calcifications. Partial visualization of a LEFT chest CVC. Other: None  IMPRESSION: 1.  No acute intracranial abnormality. 2. Postsurgical changes of the LEFT orbit. 3.  No acute fracture or static subluxation of the cervical spine. Electronically Signed   By: Valentino Saxon M.D.   On: 07/26/2021 13:39   DG Chest 1 View  Result Date: 07/26/2021 CLINICAL DATA:  Acute mental status change EXAM: CHEST  1 VIEW COMPARISON:  Jun 23, 2021 FINDINGS: Stable cardiomegaly. The hila and mediastinum are unchanged. The left central line is stable. No pneumothorax. Increased interstitial markings in the lungs consistent with mild pulmonary edema. IMPRESSION: Cardiomegaly and mild pulmonary edema. No other acute abnormalities. Electronically Signed   By: Dorise Bullion III M.D.   On: 07/26/2021 13:05   VAS [place] UPPER EXTREMITY ARTERIAL DUPLEX  Result Date: 07/10/2021  UPPER EXTREMITY DUPLEX STUDY Patient Name:  Ruffin Lada.  Date of Exam:   07/08/2021 Medical Rec #: [id number]             Accession #:    [id number] Date of Birth: [birth year]              Patient Gender: M Patient Age:   63 years Exam Location:  [facility] Procedure:  VAS [place] UPPER EXTREMITY ARTERIAL DUPLEX Referring Phys: Eulogio Ditch --------------------------------------------------------------------------------  Indications: Pre assessment for HDA.  Performing Technologist: Almira Coaster RVS  Examination Guidelines: A complete evaluation includes B-mode imaging, spectral Doppler, color Doppler, and power Doppler as needed of all accessible portions of each vessel. Bilateral testing is considered an integral part of a complete examination. Limited examinations for reoccurring indications may be performed as noted.  Left Pre-Dialysis Findings: +-----------------------+----------+--------------------+---------+--------+ Location               PSV (cm/s)Intralum. Diam. (cm)Waveform Comments +-----------------------+----------+--------------------+---------+--------+ Brachial Antecub. fossa86         0.72                triphasic         +-----------------------+----------+--------------------+---------+--------+ Radial Art at Wrist    49        0.34                triphasic         +-----------------------+----------+--------------------+---------+--------+ Ulnar Art at Wrist     54        0.28                triphasic         +-----------------------+----------+--------------------+---------+--------+  Summary:  Left: The Left Radial Artery, Ulnar Artery and Brachial Artery       appear to display Triphasic Waveforms. *See table(s) above for measurements and observations. Electronically signed by [name] MD on 07/10/2021 at 11:43:14 AM.    Final    VAS [place] UPPER EXT VEIN MAPPING (PRE-OP AVF)  Result Date: 07/10/2021 UPPER EXTREMITY VEIN MAPPING Patient Name:  Davarius Ridener.  Date of Exam:   07/08/2021 Medical Rec #: [id number]             Accession #:    [id number] Date of Birth: April 23, 1970              Patient Gender: M Patient Age:   63 years Exam Location:  [facility] Procedure:      VAS [place] UPPER EXT VEIN MAPPING (PRE-OP AVF) Referring Phys: Eulogio Ditch --------------------------------------------------------------------------------  Indications: Pre-access. Performing Technologist: Almira Coaster RVS  Examination Guidelines: A complete evaluation includes B-mode imaging, spectral Doppler, color Doppler, and power Doppler as needed of all accessible portions of each vessel. Bilateral testing is considered an integral part of a complete examination. Limited examinations for reoccurring indications may be performed as noted. +-----------------+-------------+----------+--------+ Left Cephalic    Diameter (cm)Depth (cm)Findings +-----------------+-------------+----------+--------+ Shoulder             0.49                        +-----------------+-------------+----------+--------+ Prox upper arm       0.51                         +-----------------+-------------+----------+--------+ Mid upper arm        0.36                        +-----------------+-------------+----------+--------+ Dist upper arm       0.39                        +-----------------+-------------+----------+--------+ Antecubital fossa    0.48                        +-----------------+-------------+----------+--------+  Prox forearm         0.45                        +-----------------+-------------+----------+--------+ Mid forearm          0.31                        +-----------------+-------------+----------+--------+ Dist forearm         0.26                        +-----------------+-------------+----------+--------+ +-----------------+-------------+----------+--------+ Left Basilic     Diameter (cm)Depth (cm)Findings +-----------------+-------------+----------+--------+ Mid upper arm        0.56                        +-----------------+-------------+----------+--------+ Dist upper arm       0.67                        +-----------------+-------------+----------+--------+ Antecubital fossa    0.35                        +-----------------+-------------+----------+--------+ Prox forearm         0.25                        +-----------------+-------------+----------+--------+ Summary:  Left: The Left Basilic Vein and Cephalic Vein was mapped and       measured; No evidence of Clot seen. *See table(s) above for measurements and observations.  Diagnosing physician: [name] MD Electronically signed by [name] MD on 07/10/2021 at 11:43:04 AM.    Final     Microbiology: No results found for this or any previous visit (from the past 240 hour(s)).   Labs: Basic Metabolic Panel: Recent Labs  Lab 07/26/21 1415 07/26/21 1638 07/26/21 1934 07/26/21 2358 07/27/21 0436 07/28/21 0307 07/29/21 0524  NA 139  --   --   --  137 137 134*  K 6.5*   < > 6.0* 6.0* 3.9 5.2* 4.0  CL 108  --   --   --  100 101 94*  CO2  18*  --   --   --  24 24 26   GLUCOSE 70  --   --   --  73 69* 90  BUN 79*  --   --   --  49* 64* 42*  CREATININE 11.07*  --   --   --  7.28* 9.68* 7.29*  CALCIUM 8.4*  --   --   --  8.4* 8.2* 8.6*  MG 1.9  --   --   --   --   --  1.7  PHOS  --   --   --   --  7.5*  --   --    < > = values in this interval not displayed.   Liver Function Tests: Recent Labs  Lab 07/26/21 1415 07/27/21 0436  AST 12*  --   ALT 18  --   ALKPHOS 98  --   BILITOT 0.6  --   PROT 6.5  --   ALBUMIN 3.2* 3.0*   No results for input(s): "LIPASE", "AMYLASE" in the last 168 hours. No results for input(s): "AMMONIA" in the last 168 hours. CBC: Recent Labs  Lab 07/26/21 1415 07/27/21 0436 07/28/21 0539 07/29/21 7673  WBC 8.4 16.9* 14.5* 9.9  NEUTROABS 5.5  --   --   --   HGB 10.0* 10.4* 8.6* 8.4*  HCT 33.7* 33.8* 28.7* 27.8*  MCV 73.1* 71.5* 72.5* 71.6*  PLT 153 152 113* 111*   Cardiac Enzymes: Recent Labs  Lab 07/27/21 0436  CKTOTAL 196   BNP: BNP (last 3 results) Recent Labs    12/08/20 1004 07/26/21 1415  BNP 482.0* 1,785.0*    ProBNP (last 3 results) No results for input(s): "PROBNP" in the last 8760 hours.  CBG: Recent Labs  Lab 07/29/21 2115 07/29/21 2217 07/30/21 0514 07/30/21 0551 07/30/21 0818  GLUCAP 72 84 69* 82 87       Signed:  Nita Sells MD   [facility] 07/30/2021, 9:16 AM

## 2021-07-30 NOTE — Progress Notes (Signed)
Patient resting with eyes closed in supine position as treatment completed. Easy to arouse. No complaints of pain.

## 2021-07-30 NOTE — TOC Transition Note (Signed)
Transition of Care [facility]) - CM/SW Discharge Note   Patient Details  Name: [name]. MRN: [id number] Date of Birth: 1970/04/02  Transition of Care [facility]) CM/SW Contact:  Pollie Friar, RN Phone Number: 07/30/2021, 1:50 PM   Clinical Narrative:    Patient is discharging home with self care. Pt has transportation home.    Final next level of care: Home/Self Care Barriers to Discharge: No Barriers Identified   Patient Goals and CMS Choice Patient states their goals for this hospitalization and ongoing recovery are:: get better      Discharge Placement                       Discharge Plan and Services In-house Referral: Clinical Social Work                                   Social Determinants of Health (SDOH) Interventions     Readmission Risk Interventions    07/27/2021   11:49 AM 11/27/2020    3:06 PM 02/28/2020    1:31 PM  Readmission Risk Prevention Plan  Transportation Screening Complete Complete Complete  PCP or Specialist Appt within 3-5 Days  Complete Complete  [place]RI or [place]  Complete Complete  Social Work Consult for [place] Planning/Counseling  Complete Complete  Palliative Care Screening  Not Applicable Not Applicable  Medication Review Press photographer) Complete Complete Complete  SW Recovery Care/Counseling Consult Complete    [place] Not Applicable

## 2021-07-30 NOTE — Progress Notes (Signed)
Pt discharged home. Brother at bedside. Education provided. Family has medication for clonazepam. Pt will not be back tomorrow to pick up prescription per [facility] request. They live a hour away. No question or concerns. IV taken out. Wallet, cell phone, and charger given to pt.

## 2021-07-30 NOTE — Progress Notes (Signed)
Assessment entered in incorrect time field. Created a new column in correct time

## 2021-07-30 NOTE — Procedures (Signed)
I was present at this dialysis session. I have reviewed the session itself and made appropriate changes.   Vital signs in last 24 hours:  Temp:  [98.2 F (36.8 C)-98.9 F (37.2 C)] 98.2 F (36.8 C) (06/08 0836) Pulse Rate:  [60-71] 61 (06/08 0905) Resp:  [12-19] 12 (06/08 0905) BP: (150-186)/(74-87) 171/87 (06/08 0905) SpO2:  [92 %-100 %] 97 % (06/08 0337) Weight:  [86.9 kg] 86.9 kg (06/08 0836) Weight change:  Filed Weights   07/28/21 1158 07/28/21 1556 07/30/21 0836  Weight: 89.2 kg 88.4 kg 86.9 kg    Recent Labs  Lab 07/27/21 0436 07/28/21 0307 07/29/21 0524  NA 137   < > 134*  K 3.9   < > 4.0  CL 100   < > 94*  CO2 24   < > 26  GLUCOSE 73   < > 90  BUN 49*   < > 42*  CREATININE 7.28*   < > 7.29*  CALCIUM 8.4*   < > 8.6*  PHOS 7.5*  --   --    < > = values in this interval not displayed.    Recent Labs  Lab 07/26/21 1415 07/27/21 0436 07/28/21 0307 07/29/21 0524  WBC 8.4 16.9* 14.5* 9.9  NEUTROABS 5.5  --   --   --   HGB 10.0* 10.4* 8.6* 8.4*  HCT 33.7* 33.8* 28.7* 27.8*  MCV 73.1* 71.5* 72.5* 71.6*  PLT 153 152 113* 111*    Scheduled Meds:  amiodarone  200 mg Oral Daily   apixaban  2.5 mg Oral BID   atorvastatin  80 mg Oral Daily   calcium acetate  1,334 mg Oral TID WC   carvedilol  25 mg Oral BID   Chlorhexidine Gluconate Cloth  6 each Topical Q0600   clopidogrel  75 mg Oral Daily   [START ON 08/04/2021] darbepoetin (ARANESP) injection - DIALYSIS  60 mcg Intravenous Q Tue-HD   dorzolamide-timolol  1 drop Right Eye BID   erythromycin   Right Eye QHS   insulin aspart  0-20 Units Subcutaneous TID WC   levETIRAcetam  500 mg Oral BID   And   levETIRAcetam  250 mg Oral Q T,Th,Sa-HD   mouth rinse  15 mL Mouth Rinse BID   neomycin-polymyxin b-dexamethasone  1 application. Left Eye QHS   tamsulosin  0.4 mg Oral Daily   Continuous Infusions: PRN Meds:.acetaminophen, benzocaine, cloNIDine, heparin, heparin, HYDROcodone bit-homatropine, LORazepam,  nitroGLYCERIN, oxyCODONE   Donetta Potts,  MD 07/30/2021, 9:07 AM

## 2021-07-30 NOTE — Progress Notes (Signed)
Hypoglycemic Event  CBG: 69  Treatment: 4 oz juice/soda  Symptoms: None  Follow-up CBG: Time: 0551 CBG Result: 82   Possible Reasons for Event: Inadequate meal intake - pt says he didn't eat much during the day yesterday   Comments/MD notified: Dr. Sheran Luz notified.     Frederico Hamman

## 2021-07-30 NOTE — Progress Notes (Signed)
At 1347 Pt BP elevated post dialysis, 202/84--pt given scheduled am meds [id number] (includes BP meds).    At 1546 BP 211/88, BP has not decreased any, note prn order for clonidine po for SBP>200.  Medication given.  Dr Verlon Au notified.   At 1703 BP down to 200/87, pt CBG 68, asymptomatic. (Pt doesn't like to have finger stuck for blood sugar, states "he is not diabetic",) but pt has had low blood glucoses while here at hospital.  Pt not eating much, states "doesn't like the food here".  Able to get pt to eat some graham crackers and apple juice, his wife is at bedside and asked her to try to get pt to eat and drink some to get his blood sugar up.   Dr Verlon Au notified of BP and glucose.   Pt wanting to go home. Dr Verlon Au ok sending pt home.  Pt to eat some and will work on discharge instructions.  Pt refuses to have finger stick to reassess blood sugar.     At 62 pt's wife had already left and pt stated his uncle or brother was coming to pick him up to take home.   Unit AD assisting with pt discharge.

## 2021-07-30 NOTE — Progress Notes (Signed)
Received patient in bed, alert and oriented. Informed consent signed and in chart.  Time tx initiated:0850  Pre HD weight:86.9kg  Pre HD VS:see chart  Time tx completed:1223  HD treatment completed. Patient tolerated well. HD catheter without signs and symptoms of complications. Patient transported back to the room, alert and orient and in no acute distress. Report given to bedside RN.  Total UF removed:2L  Medication given:none  Post HD VS:see chart  Post HD weight: 84.9kg

## 2021-08-05 ENCOUNTER — Telehealth (INDEPENDENT_AMBULATORY_CARE_PROVIDER_SITE_OTHER): Payer: Self-pay

## 2021-08-05 NOTE — Progress Notes (Signed)
Cardiology Office Note:    Date:  08/10/2021   ID:  [name]., DOB [birth year], MRN [id number]  PCP:  [name], MD   [facility] Providers Cardiologist:  Larae Grooms, MD     Referring MD: [name], MD   Chief Complaint: preoperative cardiac evaluation  History of Present Illness:    [name]. is a very pleasant     51 y.o. male with a hx of permanent atrial fibrillation, ESRD on HD, diabetes, hypertension, hyperlipidemia, CAD, gastroparesis, and stroke (bilateral frontal and right parietal in 03/2021, left hippocampus 01/2021).   Prior anterior STEMI treated with DES to LAD January 2022 due to 90% stenosis.  Minimal nonobstructive disease in other vessels.  He had atrial fibrillation at the time and was treated with amiodarone, oral anticoagulation.  Echo 11/2020 revealed normal LVEF 55 to 60%, no RWMA, grade 2 diastolic dysfunction, LA mild to moderately dilated, RA mildly dilated, small to moderate pericardial effusion without tamponade, borderline dilatation aortic root at 38 mm, mildly dilated pulmonary artery.  He had CVA (left hippocampus) December 2022 in the setting of holding [facility] for amputation of right upper extremity finger.   Phone call 04/06/2021 he was recommended to skip carvedilol prior to dialysis due to hypotension.  He was a no-show for Myoview scheduled for 04/08/2021 which was ordered per patient request due to possible kidney transplant. He was last seen in our office on 04/20/21 by Laurann Montana, NP at which time he presented for preop cardiac evaluation thyroidectomy with reimplantation and gland exploration as well as pending renal transplant.  EKG showed new inferior changes.  He denies chest pain however given EKG changes he was advised to get a [employer], however there is no record that it has been completed.  Admission 05/25/21 for Perma Cath exchange. Admission 5/2-06/27/21 following fall and involuntary jerking movements  observed by his wife for 1 week.  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 Admission 6/4-07/30/21 after a witnessed seizure and fall at home and continued full body shaking with minimal responsiveness afterwards. He had one episode of apnea and pulselessness for which CPR was initiated. After one round of CPR he had a palpable femoral pulse. Medication adjustments made by neurology and patient discharged home.  Today, he is here alone for follow-up. Has chest soreness from CPR performed during hospitalization 2 weeks ago. Has bilateral lower extremity edema and lightheadedness on occasion, primarily on days he does not get dialyzed. BP is labile. He takes clonidine when BP is significantly elevated and holds carvedilol if BP is low. He denies chest pain, shortness of breath, fatigue, palpitations, melena, hematuria, hemoptysis, diaphoresis, weakness, presyncope, syncope, orthopnea, and PND. This clearance request was directed to Dr. Irish Lack, primary cardiologist, who advised patient may proceed to surgery without stress test as long as he is chest pain-free. Per Dr. Lucky Cowboy, requesting surgeon, he will perform the procedure with patient remaining on his Plavix.  He has requested that he hold Eliquis only on the day of the procedure.  Past Medical History:  Diagnosis Date   Anxiety    Cellulitis    CHF (congestive heart failure) (HCC)    Chronic diarrhea    Chronic kidney disease    Chronic pain of both ankles    Coronary artery disease    Diabetes mellitus without complication ([place])    Diabetes mellitus, type II ([place])    Diabetic neuropathy ([place])  Dysrhythmia    Esophagitis    Frequent falls    Gait instability    Gastritis    GERD (gastroesophageal reflux disease)    H/O enucleation of left eyeball    Heart palpitations    Hemodialysis patient ([place])    History of kidney stones    HLD  (hyperlipidemia)    Hypertension    Insomnia    Myocardial infarction ([place])    Nausea and vomiting in adult    recurrent   Stroke [facility])     Past Surgical History:  Procedure Laterality Date   AMPUTATION Left 03/16/2016   Procedure: AMPUTATION DIGIT LEFT HALLUX;  Surgeon: Trula Slade, DPM;  Location: [place];  Service: Podiatry;  Laterality: Left;  can start around 5    AMPUTATION Right 02/09/2021   Procedure: AMPUTATION DIGIT;  Surgeon: Sherilyn Cooter, MD;  Location: [place];  Service: Orthopedics;  Laterality: Right;   ARTHROSCOPIC REPAIR ACL Left    AV FISTULA PLACEMENT Right 05/31/2019   Procedure: Brachiocephalic AV fistula creation;  Surgeon: Algernon Huxley, MD;  Location: [name] ORS;  Service: Vascular;  Laterality: Right;   COLONOSCOPY WITH PROPOFOL N/A 10/28/2015   Procedure: COLONOSCOPY WITH PROPOFOL;  Surgeon: Lollie Sails, MD;  Location: [facility] ENDOSCOPY;  Service: Endoscopy;  Laterality: N/A;   COLONOSCOPY WITH PROPOFOL N/A 10/29/2015   Procedure: COLONOSCOPY WITH PROPOFOL;  Surgeon: Lollie Sails, MD;  Location: [facility] ENDOSCOPY;  Service: Endoscopy;  Laterality: N/A;   CORONARY/GRAFT ACUTE MI REVASCULARIZATION N/A 02/26/2020   Procedure: Coronary/Graft Acute MI Revascularization;  Surgeon: Jettie Booze, MD;  Location: [place] CV LAB;  Service: Cardiovascular;  Laterality: N/A;   DIALYSIS/PERMA CATHETER INSERTION Right 04/26/2019   Perm Cath    DIALYSIS/PERMA CATHETER INSERTION N/A 04/26/2019   Procedure: DIALYSIS/PERMA CATHETER INSERTION;  Surgeon: Algernon Huxley, MD;  Location: [place] CV LAB;  Service: Cardiovascular;  Laterality: N/A;   DIALYSIS/PERMA CATHETER INSERTION N/A 05/25/2021   Procedure: DIALYSIS/PERMA CATHETER INSERTION;  Surgeon: Algernon Huxley, MD;  Location: [place] CV LAB;  Service: Cardiovascular;  Laterality: N/A;   DIALYSIS/PERMA CATHETER REMOVAL N/A 08/15/2019   Procedure: DIALYSIS/PERMA CATHETER REMOVAL;  Surgeon: Katha Cabal, MD;  Location: [place] CV LAB;  Service: Cardiovascular;  Laterality: N/A;   EMBOLIZATION Right 02/06/2021   Procedure: EMBOLIZATION;  Surgeon: Algernon Huxley, MD;  Location: [place] CV LAB;  Service: Cardiovascular;  Laterality: Right;  Right Upper Extremity Dialysis Access, Permcath Placement   ESOPHAGOGASTRODUODENOSCOPY (EGD) WITH PROPOFOL N/A 12/27/2017   Procedure: ESOPHAGOGASTRODUODENOSCOPY (EGD) WITH PROPOFOL;  Surgeon: Tol[place], Benay Pike, MD;  Location: [name] ENDOSCOPY;  Service: Gastroenterology;  Laterality: N/A;   EYE SURGERY     INTRAVASCULAR ULTRASOUND/IVUS N/A 02/26/2020   Procedure: Intravascular Ultrasound/IVUS;  Surgeon: Jettie Booze, MD;  Location: [place] CV LAB;  Service: Cardiovascular;  Laterality: N/A;   LEFT HEART CATH AND CORONARY ANGIOGRAPHY N/A 02/26/2020   Procedure: LEFT HEART CATH AND CORONARY ANGIOGRAPHY;  Surgeon: Jettie Booze, MD;  Location: [place] CV LAB;  Service: Cardiovascular;  Laterality: N/A;   PROSTATE SURGERY  2016   TONSILECTOMY/ADENOIDECTOMY WITH MYRINGOTOMY     TONSILLECTOMY     UPPER EXTREMITY ANGIOGRAPHY Right 11/26/2020   Procedure: Upper Extremity Angiography;  Surgeon: Algernon Huxley, MD;  Location: [place] CV LAB;  Service: Cardiovascular;  Laterality: Right;   UPPER EXTREMITY ANGIOGRAPHY Right 02/05/2021   Procedure: UPPER EXTREMITY ANGIOGRAPHY;  Surgeon: Algernon Huxley, MD;  Location:  [place] CV LAB;  Service: Cardiovascular;  Laterality: Right;    Current Medications: Current Meds  Medication Sig   acetaminophen (TYLENOL) 325 MG tablet Take 1-2 tablets (325-650 mg total) by mouth every 4 (four) hours as needed for mild pain.   amLODipine (NORVASC) 5 MG tablet Take 5 mg by mouth daily.   atorvastatin (LIPITOR) 80 MG tablet Take 1 tablet (80 mg total) by mouth daily. (Patient taking differently: Take 80 mg by mouth at bedtime.)   calcium acetate (PHOSLO) 667 MG capsule Take 2 capsules  (1,334 mg total) by mouth 3 (three) times daily with meals.   carvedilol (COREG) 25 MG tablet Take 25 mg by mouth 2 (two) times daily.   clonazePAM (KLONOPIN) 0.5 MG disintegrating tablet Take 1 tablet (0.5 mg total) by mouth daily as needed for seizure (for seizures).   clopidogrel (PLAVIX) 75 MG tablet Take 1 tablet (75 mg total) by mouth daily.   dorzolamide-timolol (COSOPT) 22.3-6.8 MG/ML ophthalmic solution Place 1 drop into the right eye 2 (two) times daily.   ELIQUIS 2.5 MG TABS tablet Take 2 tablets (5 mg total) by mouth 2 (two) times daily.   erythromycin ophthalmic ointment Place 1 Application into both eyes daily.   hydrocortisone 2.5 % cream Place rectally.   lactobacillus (FLORANEX/LACTINEX) PACK Take 1 packet (1 g total) by mouth 3 (three) times daily with meals.   levETIRAcetam (KEPPRA) 250 MG tablet Take 1 tablet (250 mg total) by mouth Every Tuesday,Thursday,and Saturday with dialysis.   levETIRAcetam (KEPPRA) 500 MG tablet Take 1 tablet (500 mg total) by mouth 2 (two) times daily.   melatonin 5 MG TABS Take 1 tablet (5 mg total) by mouth at bedtime as needed.   neomycin-polymyxin b-dexamethasone (MAXITROL) 3.5-10000-0.1 OINT Place 1 application. into the left eye in the morning and at bedtime.   nitroGLYCERIN (NITROSTAT) 0.4 MG SL tablet Place 1 tablet (0.4 mg total) under the tongue every 5 (five) minutes as needed for chest pain.   phenylephrine (,USE FOR PREPARATION-H,) 0.25 % suppository Place 1 suppository rectally 2 (two) times daily as needed for hemorrhoids.   prednisoLONE acetate (PRED FORTE) 1 % ophthalmic suspension Place 1 drop into the left eye every 4 (four) hours.   PREPARATION H 0.25-88.44 % suppository Place rectally.   tamsulosin (FLOMAX) 0.4 MG CAPS capsule Take 0.4 mg by mouth daily.   witch hazel-glycerin (TUCKS) pad Apply topically as needed for itching.   [DISCONTINUED] amiodarone (PACERONE) 200 MG tablet TAKE 1 TABLET BY MOUTH ONCE DAILY. (Patient taking  differently: Take 200 mg by mouth every morning.)   [DISCONTINUED] cloNIDine (CATAPRES) 0.1 MG tablet Take 1 tablet (0.1 mg total) by mouth 3 (three) times daily as needed (systolic blood pressure (top number) is Greater than 200 or DBP (lower number)is Greater than 110).     Allergies:   Levofloxacin, Promethazine, Promethazine hcl, Other, and Malt   Social History   Socioeconomic History   Marital status: Married    Spouse name: [name]    Number of children: 2   Years of education: Not on file   Highest education level: High school graduate  Occupational History   Occupation: Disability    Comment: not employed  Tobacco Use   Smoking status: Never   Smokeless tobacco: Never  Vaping Use   Vaping Use: Never used  Substance and Sexual Activity   Alcohol use: No   Drug use: No   Sexual activity: Yes  Other Topics Concern   Not  on file  Social History Narrative   Oldest son killed in car crash June 2020.    Social Determinants of Health   Financial Resource Strain: High Risk (07/05/2017)   Overall Financial Resource Strain (CARDIA)    Difficulty of Paying Living Expenses: Hard  Food Insecurity: Food Insecurity Present (07/05/2017)   Hunger Vital Sign    Worried About Running Out of Food in the Last Year: Often true    Ran Out of Food in the Last Year: Often true  Transportation Needs: No Transportation Needs (02/28/2020)   PRAPARE - Hydrologist (Medical): No    Lack of Transportation (Non-Medical): No  Physical Activity: Inactive (07/05/2017)   Exercise Vital Sign    Days of Exercise per Week: 0 days    Minutes of Exercise per Session: 0 min  Stress: Stress Concern Present (07/05/2017)   [place]    Feeling of Stress : Rather much  Social Connections: Moderately Integrated (07/05/2017)   Social Connection and Isolation Panel [NHANES]    Frequency of Communication with Friends  and Family: Three times a week    Frequency of Social Gatherings with Friends and Family: Once a week    Attends Religious Services: More than 4 times per year    Active Member of [employer] or Organizations: No    Attends Music therapist: Never    Marital Status: Married     Family History: The patient's family history includes CAD in his father; Diabetes Mellitus II in his mother; Kidney failure in his mother; Schizophrenia in his mother; Stroke in his father.  ROS:   Please see the history of present illness.    + chest soreness All other systems reviewed and are negative.  Labs/Other Studies Reviewed:    The following studies were reviewed today:  Echo 04/22/21   1. Left ventricular ejection fraction, by estimation, is 55 to 60%. The  left ventricle has normal function. The left ventricle has no regional  wall motion abnormalities. There is severe concentric left ventricular  hypertrophy. Left ventricular diastolic   parameters are consistent with Grade I diastolic dysfunction (impaired  relaxation).   2. Right ventricular systolic function is normal. The right ventricular  size is normal. Tricuspid regurgitation signal is inadequate for assessing  PA pressure.   3. Left atrial size was severely dilated.   4. Moderate pericardial effusion.   5. The mitral valve is grossly normal. Trivial mitral valve  regurgitation. No evidence of mitral stenosis.   6. The aortic valve is tricuspid. Aortic valve regurgitation is not  visualized.   7. Aortic dilatation noted. There is mild dilatation of the aortic root,  measuring 44 mm.   8. The inferior vena cava is normal in size with greater than 50%  respiratory variability, suggesting right atrial pressure of 3 mmHg.   Comparison(s): Improvement in pericardial effusion.    LHC 02/26/20  The left ventricular systolic function is normal. LV end diastolic pressure is normal. The left ventricular ejection fraction is greater  than 65% by visual estimate. There is no aortic valve stenosis. Mid LAD lesion is 90% stenosed. A drug-eluting stent was successfully placed using a STENT RESOLUTE ONYX 3.5X30, postdilated to > 4 mm. Stent optimized with IVUS. Post intervention, there is a 0% residual stenosis.   I suspect his abnormal ECG and anterior injury pattern was more from demand ischemia in the setting of hypotension and AFib  with RVR, with severe mid LAD lesion.   During the cath, he converted to NSR briefly and his pulse pressure and BP returned to normal without any pressors. In addition, LVEDP only 11 mm Hg despite having 2.4 L NS administered on transport. He may have been a little dehydrated after 3 L fluid removal after dialysis.    In regards to AFib.  Continue IV Amiodarone.  Will have to decide if he needs longterm anticoagulation along with antiplatelet therapy.  If so, would change Brilinta to clopidogrel.  Would have to check what options work in the setting of ESRD.   US Carotid Bilateral 06/2017  RIGHT CAROTID ARTERY: Flow is demonstrated throughout the right carotid bulb and bifurcation on color flow Doppler imaging. No significant stenosis identified.   RIGHT VERTEBRAL ARTERY:  Antegrade flow direction is identified.   LEFT CAROTID ARTERY: Flow is demonstrated throughout the left carotid bulb and bifurcation on color flow Doppler imaging. No significant stenosis is identified.   LEFT VERTEBRAL ARTERY:  Antegrade flow direction is identified.   IMPRESSION: No evidence of hemodynamically significant stenosis of either right or left internal carotid artery.  Recent Labs: 04/22/2021: TSH 3.772 07/26/2021: ALT 18; B Natriuretic Peptide 1,785.0 07/29/2021: Magnesium 1.7 07/30/2021: BUN 60; Creatinine, Ser 9.77; Hemoglobin 8.4; Platelets 132; Potassium 4.7; Sodium 135  Recent Lipid Panel    Component Value Date/Time   CHOL 132 04/22/2021 0515   TRIG 74 04/22/2021 0515   HDL 48 04/22/2021 0515    CHOLHDL 2.8 04/22/2021 0515   VLDL 15 04/22/2021 0515   LDLCALC 69 04/22/2021 0515   LDLCALC 102 (H) 04/25/2019 1159     Risk Assessment/Calculations:    CHA2DS2-VASc Score = 6   :1} This indicates a 9.7% annual risk of stroke. The patient's score is based upon: CHF History: 1 HTN History: 1 Diabetes History: 1 Stroke History: 2 Vascular Disease History: 1 Age Score: 0 Gender Score: 0   Physical Exam:    VS:  BP 140/60   Pulse 66   Ht 6\' 3"  (1.905 m)   Wt 194 lb (88 kg)   SpO2 97%   BMI 24.25 kg/m     Wt Readings from Last 3 Encounters:  08/10/21 194 lb (88 kg)  07/30/21 191 lb 9.3 oz (86.9 kg)  07/22/21 193 lb (87.5 kg)     GEN:  Well nourished, well developed in no acute distress HEENT: Normal, left enucleation NECK: No JVD; No carotid bruits CARDIAC: RRR, no murmurs, rubs, gallops RESPIRATORY:  Clear to auscultation without rales, wheezing or rhonchi  ABDOMEN: Soft, non-tender, non-distended MUSCULOSKELETAL:  Mild non-pitting bilateral LE edema; No deformity. 2+ pedal pulses, equal bilaterally SKIN: Warm and dry NEUROLOGIC:  Alert and oriented x 3 PSYCHIATRIC:  Normal affect   EKG:  EKG is ordered today.  The ekg ordered today demonstrates NSR at 66 bpm, with LVH criteria, no significant change from previous  Diagnoses:    1. ESRD (end stage renal disease) ([place])   2. Preoperative cardiovascular examination   3. Hyperlipidemia LDL goal <70   4. PAF (paroxysmal atrial fibrillation) ([place])   5. Hypercoagulable state ([place])   6. Coronary artery disease involving native coronary artery of native heart without angina pectoris   7. Essential hypertension    Assessment and Plan:     Preoperative cardiac evaluation: He is doing well from a cardiac perspective and may proceed to surgery without further testing. He stopped his Plavix 3 days ago, however I informed  him that per Dr. Lucky Cowboy (vascular surgeon), he may continue Plavix through the perioperative period.  According to the Revised Cardiac Risk Index (RCRI), his Perioperative Risk of Major Cardiac Event is (%): 6.6. His Functional Capacity in METs is: 6.61 according to the [facility] Activity Status Index (DASI).  Atrial fibrillation on chronic anticoagulation: NSR at 66 bpm today. He denies chest pain, palpitations, and dypsnea. No indication of worsening epidsodes of a fib. He may hold Eliquis for the day of the procedure per Dr. Lucky Cowboy. No bleeding concerns. Continue amiodarone, Eliquis, carvedilol.   CAD s/p DES to mLAD 02/2020: Documentation that he underwent one round of CPR during hospitalization 07/2021, pulseless following a seizure. Has chest soreness in the location of compressions. He denies dyspnea, or other symptoms concerning for angina.  No indication for further ischemic evaluation at this time. Continue Plavix, atorvastatin, carvedilol.   Hypertension: BP is stable today. It is frequently labile and changes based on timing of dialysis. Severe LVH on echo 04/2021. Takes clonidine if BP is high. No medication changes today.   ESRD on HD: He is scheduled for change of dialysis catheter on 08/13/2021.  As noted above, he will continue Plavix through the perioperative period. Continue to monitor BP. Management per nephrology.   Disposition: 6 months with Dr. Irish Lack   Medication Adjustments/Labs and Tests Ordered: Current medicines are reviewed at length with the patient today.  Concerns regarding medicines are outlined above.  Orders Placed This Encounter  Procedures   EKG 12-Lead   Meds ordered this encounter  Medications   cloNIDine (CATAPRES) 0.1 MG tablet    Sig: Take 1 tablet (0.1 mg total) by mouth 3 (three) times daily as needed (systolic blood pressure (top number) is Greater than 200 or DBP (lower number)is Greater than 110).    Dispense:  30 tablet    Refill:  3   amiodarone (PACERONE) 200 MG tablet    Sig: Take 1 tablet (200 mg total) by mouth every morning.    Dispense:  90  tablet    Refill:  3    Patient Instructions  Medication Instructions:   Your physician recommends that you continue on your current medications as directed. Please refer to the Current Medication list given to you today.   *If you need a refill on your cardiac medications before your next appointment, please call your pharmacy*   Lab Work:  None ordered.  If you have labs (blood work) drawn today and your tests are completely normal, you will receive your results only by: [place] (if you have MyChart) OR A paper copy in the mail If you have any lab test that is abnormal or we need to change your treatment, we will call you to review the results.   Testing/Procedures:  None ordered.   Follow-Up: At [facility], you and your health needs are our priority.  As part of our continuing mission to provide you with exceptional heart care, we have created designated Provider Care Teams.  These Care Teams include your primary Cardiologist (physician) and Advanced Practice Providers (APPs -  Physician Assistants and Nurse Practitioners) who all work together to provide you with the care you need, when you need it.  We recommend signing up for the patient portal called "MyChart".  Sign up information is provided on this After Visit Summary.  MyChart is used to connect with patients for Virtual Visits (Telemedicine).  Patients are able to view lab/test results, encounter notes, upcoming appointments, etc.  Non-urgent messages can be sent to your provider as well.   To learn more about what you can do with MyChart, go to [email or web address].    Your next appointment:   6 month(s)  The format for your next appointment:   In Person  Provider:   Larae Grooms, MD    Important Information About Sugar         Signed, Emmaline Life, NP  08/10/2021 12:21 PM    [place]

## 2021-08-05 NOTE — Telephone Encounter (Signed)
I attempted to contact the patient to give him the information regarding his surgery with Dr. Lucky Cowboy on 08/13/21 at the [facility]. Pre-op phone call is on 08/07/21 between 8-1 pm. Pre-surgical instructions will be mailed.

## 2021-08-06 NOTE — Telephone Encounter (Signed)
Please see notes from Honor Loh, NP

## 2021-08-07 ENCOUNTER — Encounter
Admission: RE | Admit: 2021-08-07 | Discharge: 2021-08-07 | Disposition: A | Discharge status: Home / Self Care | Payer: Medicare HMO | Source: Ambulatory Visit | Attending: Vascular Surgery | Admitting: Vascular Surgery

## 2021-08-07 DIAGNOSIS — Z992 Dependence on renal dialysis: Secondary | ICD-10-CM

## 2021-08-07 DIAGNOSIS — Z01818 Encounter for other preprocedural examination: Secondary | ICD-10-CM

## 2021-08-07 NOTE — Patient Instructions (Addendum)
Your procedure is scheduled on:08-13-21 Thursday Report to the Registration Desk on the 1st floor of the [facility].Then proceed to the 2nd floor Surgery Desk To find out your arrival time, please call [phone] between 1PM - 3PM on:08-12-21 Wednesday If your arrival time is 6:00 am, do not arrive prior to that time as the [facility] entrance doors do not open until 6:00 am.  REMEMBER: Instructions that are not followed completely may result in serious medical risk, up to and including death; or upon the discretion of your surgeon and anesthesiologist your surgery may need to be rescheduled.  Do not eat food after midnight the night before surgery.  No gum chewing, lozengers or hard candies.  You may however, drink Water up to 2 hours before you are scheduled to arrive for your surgery. Do not drink anything within 2 hours of your scheduled arrival time.  Type 1 and Type 2 diabetics should only drink water  TAKE THESE MEDICATIONS THE MORNING OF SURGERY WITH A SIP OF WATER: -amiodarone (PACERONE) -carvedilol (COREG) -levETIRAcetam (KEPPRA)  Stop your clopidogrel (PLAVIX) 5 days prior to surgery-Last dose today (08-07-21 Friday)  Stop your ELIQUIS 2 days prior to surgery-Last dose on 08-10-21 Monday  One week prior to surgery: Stop Anti-inflammatories (NSAIDS) such as Advil, Aleve, Ibuprofen, Motrin, Naproxen, Naprosyn and Aspirin based products such as Excedrin, Goodys Powder, BC Powder.You may however, take Tylenol if needed for pain up until the day of surgery. Stop ANY OVER THE COUNTER supplements/vitamins NOW (08-07-21) until after surgery.  No Alcohol for 24 hours before or after surgery.  No Smoking including e-cigarettes for 24 hours prior to surgery.  No chewable tobacco products for at least 6 hours prior to surgery.  No nicotine patches on the day of surgery.  Do not use any "recreational" drugs for at least a week prior to your surgery.  Please be advised that the  combination of cocaine and anesthesia may have negative outcomes, up to and including death. If you test positive for cocaine, your surgery will be cancelled.  On the morning of surgery brush your teeth with toothpaste and water, you may rinse your mouth with mouthwash if you wish. Do not swallow any toothpaste or mouthwash.  Use CHG wipes as directed on instruction sheet.  Do not wear jewelry, make-up, hairpins, clips or nail polish.  Do not wear lotions, powders, or perfumes.   Do not shave body from the neck down 48 hours prior to surgery just in case you cut yourself which could leave a site for infection.  Also, freshly shaved skin may become irritated if using the CHG soap.  Contact lenses, hearing aids and dentures may not be worn into surgery.  Do not bring valuables to the hospital. [facility] is not responsible for any missing/lost belongings or valuables.   Notify your doctor if there is any change in your medical condition (cold, fever, infection).  Wear comfortable clothing (specific to your surgery type) to the hospital.  After surgery, you can help prevent lung complications by doing breathing exercises.  Take deep breaths and cough every 1-2 hours. Your doctor may order a device called an Incentive Spirometer to help you take deep breaths. When coughing or sneezing, hold a pillow firmly against your incision with both hands. This is called "splinting." Doing this helps protect your incision. It also decreases belly discomfort.  If you are being admitted to the hospital overnight, leave your suitcase in the car. After surgery it  may be brought to your room.  If you are being discharged the day of surgery, you will not be allowed to drive home. You will need a responsible adult (18 years or older) to drive you home and stay with you that night.   If you are taking public transportation, you will need to have a responsible adult (18 years or older) with you. Please  confirm with your physician that it is acceptable to use public transportation.   Please call the [facility] Dept. at [phone] if you have any questions about these instructions.  Surgery Visitation Policy:  Patients undergoing a surgery or procedure may have two family members or support persons with them as long as the person is not COVID-19 positive or experiencing its symptoms.

## 2021-08-07 NOTE — Pre-Procedure Instructions (Addendum)
Pt has been called for 2 days trying to get anesthesia interview completed. Messages have been left and pt is not calling back. Devona Konig at Dr Ozella Almond office notified of this thru secure chat. Pt has not called their office back either. Pts plavix is to be stopped today but not sure pt will do this due to not calling us back and being able to relay this info to him

## 2021-08-10 ENCOUNTER — Encounter: Payer: Self-pay | Admitting: Nurse Practitioner

## 2021-08-10 ENCOUNTER — Ambulatory Visit (INDEPENDENT_AMBULATORY_CARE_PROVIDER_SITE_OTHER): Payer: Medicare HMO | Admitting: Nurse Practitioner

## 2021-08-10 VITALS — BP 140/60 | HR 66 | Ht 75.0 in | Wt 194.0 lb

## 2021-08-10 DIAGNOSIS — I120 Hypertensive chronic kidney disease with stage 5 chronic kidney disease or end stage renal disease: Secondary | ICD-10-CM | POA: Diagnosis not present

## 2021-08-10 DIAGNOSIS — I480 Paroxysmal atrial fibrillation: Secondary | ICD-10-CM | POA: Diagnosis not present

## 2021-08-10 DIAGNOSIS — I2510 Atherosclerotic heart disease of native coronary artery without angina pectoris: Secondary | ICD-10-CM

## 2021-08-10 DIAGNOSIS — Z7901 Long term (current) use of anticoagulants: Secondary | ICD-10-CM

## 2021-08-10 DIAGNOSIS — I10 Essential (primary) hypertension: Secondary | ICD-10-CM

## 2021-08-10 DIAGNOSIS — Z992 Dependence on renal dialysis: Secondary | ICD-10-CM

## 2021-08-10 DIAGNOSIS — N186 End stage renal disease: Secondary | ICD-10-CM

## 2021-08-10 DIAGNOSIS — E785 Hyperlipidemia, unspecified: Secondary | ICD-10-CM | POA: Diagnosis not present

## 2021-08-10 DIAGNOSIS — Z01810 Encounter for preprocedural cardiovascular examination: Secondary | ICD-10-CM

## 2021-08-10 DIAGNOSIS — D6859 Other primary thrombophilia: Secondary | ICD-10-CM | POA: Diagnosis not present

## 2021-08-10 MED ORDER — CLONIDINE HCL 0.1 MG PO TABS: 0.1000 mg | ORAL_TABLET | Freq: Three times a day (TID) | ORAL | 3 refills | Status: DC | PRN

## 2021-08-10 MED ORDER — AMIODARONE HCL 200 MG PO TABS: 200.0000 mg | ORAL_TABLET | ORAL | 3 refills | Status: DC

## 2021-08-10 NOTE — Patient Instructions (Signed)
Medication Instructions:   Your physician recommends that you continue on your current medications as directed. Please refer to the Current Medication list given to you today.   *If you need a refill on your cardiac medications before your next appointment, please call your pharmacy*   Lab Work:  None ordered.  If you have labs (blood work) drawn today and your tests are completely normal, you will receive your results only by: [place] (if you have MyChart) OR A paper copy in the mail If you have any lab test that is abnormal or we need to change your treatment, we will call you to review the results.   Testing/Procedures:  None ordered.   Follow-Up: At [facility], you and your health needs are our priority.  As part of our continuing mission to provide you with exceptional heart care, we have created designated Provider Care Teams.  These Care Teams include your primary Cardiologist (physician) and Advanced Practice Providers (APPs -  Physician Assistants and Nurse Practitioners) who all work together to provide you with the care you need, when you need it.  We recommend signing up for the patient portal called "MyChart".  Sign up information is provided on this After Visit Summary.  MyChart is used to connect with patients for Virtual Visits (Telemedicine).  Patients are able to view lab/test results, encounter notes, upcoming appointments, etc.  Non-urgent messages can be sent to your provider as well.   To learn more about what you can do with MyChart, go to [email or web address].    Your next appointment:   6 month(s)  The format for your next appointment:   In Person  Provider:   Larae Grooms, MD    Important Information About Sugar

## 2021-08-11 ENCOUNTER — Encounter: Payer: Self-pay | Admitting: Vascular Surgery

## 2021-08-11 NOTE — Progress Notes (Signed)
Perioperative Services  Pre-Admission/Anesthesia Testing Clinical Review  Date: 08/11/21  Patient Demographics:  Name: [name]. DOB:   December 31, 1970 MRN:   [id number]  Planned Surgical Procedure(s):    Case: [id number] Date/Time: 08/13/21 1230   Procedure: ARTERIOVENOUS (AV) FISTULA CREATION (BRACHIOCEPHALIC) (Left)   Anesthesia type: General   Pre-op diagnosis: ESRD   Location: [name] OR ROOM 09 / [place] ORS FOR ANESTHESIA GROUP   Surgeons: Algernon Huxley, MD   NOTE: Available PAT nursing documentation and vital signs have been reviewed. Clinical nursing staff has updated patient's PMH/PSHx, current medication list, and drug allergies/intolerances to ensure comprehensive history available to assist in medical decision making as it pertains to the aforementioned surgical procedure and anticipated anesthetic course. Extensive review of available clinical information performed. [place] PM[facility] and PSHx updated with any diagnoses/procedures that  may have been inadvertently omitted during his intake with the pre-admission testing department's nursing staff.  Clinical Discussion:  [name] Husain Costabile. is a 51 y.o. male who is submitted for pre-surgical anesthesia review and clearance prior to him undergoing the above procedure. Patient has never been a smoker. Pertinent PMH includes: CAD, cardiac arrest, STEMI, NSTEMI, atrial fibrillation, CHF, aortic dilatation, palpitations, CVA x 3, HTN, HLD, T2DM, ESRD (on hemodialysis T-Th-Sat), GERD (no daily Tx), gastroparesis, gait instability/frequent falls, diabetic neuropathy, BPH, seizures, anxiety (on BZO), LEFT eye enucleation.  Patient is followed by cardiology Irish Lack, MD). He was last seen in the cardiology clinic on 08/10/2021; notes reviewed.  At the time of his clinic visit, patient being seen in hospital follow-up.  Since he was discharged home, patient has denied chest pain, although he does have soreness in his chest resulting from CPR  (see below for further details). Patient denies short of breath, PND, orthopnea, palpitations, fatigue, or presyncope/syncope.  Patient with vertiginous symptoms and BILATERAL lower extremity edema.  Past medical history significant for cardiovascular diagnoses.  Patient suffered an anterior wall STEMI in 02/26/2020.  Diagnostic left heart catheterization revealed normal left ventricular systolic function with an EF of 65%.  LVEDP 11 mmHg.  There was single-vessel CAD with a 90% stenosis noted to the mid LAD.  Subsequent PCI was performed placing a 3.5 x 20 mm resolute Onyx DES yielding excellent angiographic result and TIMI-3 flow.  While being seen for procedures related to hyperkalemia (6.5 mmol/L) secondary to missed dialysis treatment, patient went into cardiac arrest while in the ED.  CPR was required.  Notes indicate that 1 round of CPR was provided prior to ROSC.  Patient presented for care at [facility], however was transferred to [facility] for urology evaluation and testing.  Last TTE was performed on 04/22/2021 revealing normal left ventricular systolic function with an LVEF of 55 to 60%.  There was severe concentric LVH. Diastolic Doppler parameters consistent with abnormal relaxation (G1DD). Tricuspid regurgitation signal inadequate for assessing PA pressure.  There was severe left atrial enlargement.  Trivial mitral valve regurgitation noted.  There was no evidence of a significant transvalvular gradient to suggest stenosis.  Mild dilatation of the aortic root noted measuring 44 mm.  Patient reportedly suffered 3 strokes between 04/2021 and 07/11/2021.  Patient has an atrial fibrillation diagnosis; CHA2DS2-VASc Score = 6 (CHF, HTN, CVA x 2, prior MI, T2DM).  Rate and rhythm currently being maintained on oral amiodarone + carvedilol.  Patient chronically anticoagulated using dose reduced apixaban + cerebral.  He is compliant with prescribed anticoagulation/antiplatelet  therapies with no evidence or reports of GI bleeding.  Blood pressure reasonably controlled at 140/60 on currently prescribed CCB, beta-blocker, and alpha-blocker therapies.  He is on a statin for his HLD.  Patient has a supply of SL NTG to use on a as needed basis; denied recent use.  T2DM well-controlled with diet lifestyle modification; last HgbA1c was 4.7% when checked on 04/21/2021. Functional capacity, as defined by DASI, is documented as being >/= 4 METS.  ECG in the clinic revealed normal sinus rhythm at a rate of 66 bpm with LVH criteria and no significant changes from previously obtained tracing.  No changes were made to his medication regimen.  Patient to follow-up with primary outpatient cardiology team in 6 months or sooner if needed.  [name] [name]. is scheduled for insertion of a LEFT BRACHIOCEPHALIC ARTERIOVENOUS FISTULA on 08/13/2021 with Dr. [name], MD. Given patient's past medical history significant for cardiovascular diagnoses, presurgical cardiac clearance was sought by the PAT team.  Per cardiology, "RCRI places patient at a 6.6% risk of 30-day MACE.  His functional capacity and METS of 6.61 according to the DASI.  Therefore, based ACC/AHA guidelines, the patient's this patient would be at an overall ACCEPTABLE risk for the planned procedure without further cardiovascular testing or intervention at this time".  Again, this patient is on both daily antiplatelet and anticoagulation therapies.  Initially, cardiology advising that patient would need to be admitted to the hospital for bridging with intravenous heparin drip prior to proceeding with planned surgical intervention.  After speaking with vascular surgery, Dr. Lucky Cowboy advising that he is comfortable performing the procedure with patient remaining on both his apixaban and clopidogrel.  This information has been communicated to the patient.  Patient denies previous perioperative complications with anesthesia in the past. In review  of the available records, it is noted that patient underwent a general anesthetic course at [facility] ([facility]) in 02/2021 without documented complications.      08/10/2021    9:22 AM 07/30/2021    5:03 PM 07/30/2021    3:46 PM  Vitals with BMI  Height 6\' 3"     Weight 194 lbs    BMI 59.93    Systolic 570 177 939  Diastolic 60 87 88  Pulse 66  72    Providers/Specialists:   NOTE: Primary physician provider listed below. Patient may have been seen by APP or partner within same practice.   PROVIDER ROLE / SPECIALTY LAST Imelda Pillow, MD Vascular Surgery (Surgeon) 07/08/2021  [name], MD Primary Care Provider 06/22/2021  Larae Grooms, MD Cardiology 08/10/2021  Jennings Books, MD Urology 07/10/2021   Allergies:  Levofloxacin, Promethazine, Promethazine hcl, Other, and Malt  Current Home Medications:   No current facility-administered medications for this encounter.    acetaminophen (TYLENOL) 325 MG tablet   amiodarone (PACERONE) 200 MG tablet   amLODipine (NORVASC) 5 MG tablet   atorvastatin (LIPITOR) 80 MG tablet   calcium acetate (PHOSLO) 667 MG capsule   carvedilol (COREG) 25 MG tablet   clonazePAM (KLONOPIN) 0.5 MG disintegrating tablet   cloNIDine (CATAPRES) 0.1 MG tablet   clopidogrel (PLAVIX) 75 MG tablet   dorzolamide-timolol (COSOPT) 22.3-6.8 MG/ML ophthalmic solution   ELIQUIS 2.5 MG TABS tablet   erythromycin ophthalmic ointment   hydrocortisone 2.5 % cream   lactobacillus (FLORANEX/LACTINEX) PACK   levETIRAcetam (KEPPRA) 250 MG tablet   levETIRAcetam (KEPPRA) 500 MG tablet   melatonin 5 MG TABS   neomycin-polymyxin b-dexamethasone (MAXITROL) 3.5-10000-0.1 OINT   nitroGLYCERIN (  NITROSTAT) 0.4 MG SL tablet   phenylephrine (,USE FOR PREPARATION-H,) 0.25 % suppository   prednisoLONE acetate (PRED FORTE) 1 % ophthalmic suspension   PREPARATION H 0.25-88.44 % suppository   tamsulosin (FLOMAX) 0.4 MG CAPS capsule    witch hazel-glycerin (TUCKS) pad   History:   Past Medical History:  Diagnosis Date   Anxiety    a.) on BZO (clonazepam) PRN   Aortic dilatation ([place]) 11/25/2020   a.) TTE 11/25/2020: Ao root measured 38 mm. b.) TTE 04/22/2021: Ao root measured 44 mm.   Atrial fibrillation ([place])    a.) CHA2DS2-VASc = 6 (CHF, HTN, CVA x2, prior MI, T2DM). b.) rate/rhythm maintaine don oral amiodarone + carvedilol; acronically anticoagulated using dose reduced apixaban + clopidogrel   BPH (benign prostatic hyperplasia)    Cardiac arrest ([place]) 07/26/2021   a.) in setting of hyperkalemia, NSTEMI, and seizure following missed HD; pulseless and apneic --> required 1 round of CPR prior to ROSC   Cellulitis    CHF (congestive heart failure) ([place]) 07/19/2017   a.) TTE 07/19/2017: EF 75%; sev LVH; mild MR/PR/TR; G1DD. b.) TTE 11/25/2020: EF 55-60%; mod LVH; GLS -13.0; mild-mod BAE; triv MR; mild dilated PA; G2DD. c.) TTE 04/22/2021: EF 55-60%; sev LVH; sev LAE, triv MR; G1DD.   Chronic diarrhea    Chronic pain of both ankles    Coronary artery disease 02/26/2020   a.) anterior STEMI 02/26/2020 --> LHC/PCI --> EF 65%; 90% mLAD (3.5 x 30 mm Resolyte Onyx DES x 1). b.) NSTEMI 07/26/2021   Diabetes mellitus, type II ([place])    Diabetic neuropathy (HCC)    Esophagitis    ESRD (end stage renal disease) on dialysis ([place])    a.) [facility]; T-Th-Sat   Frequent falls    Gait instability    Gastritis    Gastroparesis    GERD (gastroesophageal reflux disease)    H/O enucleation of left eyeball    Heart palpitations    History of kidney stones    HLD (hyperlipidemia)    Hypertension    Insomnia    a.) uses melatonin PRN   Long term current use of anticoagulant    a.) dose reduced apixaban   Long term current use of antithrombotics/antiplatelets    a.) clopidogrel   Nausea and vomiting in adult    recurrent   NSTEMI (non-ST elevated myocardial infarction) ([place]) 07/26/2021   a.) hyperkalemic from missed HD;  seizures; decompensated to cardiac arrest and required CPR x 1 round prior to ROSC.   Seizure ([place])    a.) last 07/26/2021 in setting of missed HD --> hyperkalemic at 6.5 --> pulseless/apneic and required CPR; discharged home on levetiracetam.   ST elevation myocardial infarction (STEMI) of anterior wall ([place]) 02/26/2020   a.) LHC/PCI 02/26/2020 --> EF 65%; LVEDP 11 mmHg; 90% mLAD (3.5 x 20 mm Resolute Onyx DES x 1)   Stroke ([place]) 04/2021   a.) documented as x 3 between 04/2021 and 06/2021   Past Surgical History:  Procedure Laterality Date   AMPUTATION Left 03/16/2016   Procedure: AMPUTATION DIGIT LEFT HALLUX;  Surgeon: Trula Slade, DPM;  Location: [place];  Service: Podiatry;  Laterality: Left;  can start around 5    AMPUTATION Right 02/09/2021   Procedure: AMPUTATION DIGIT;  Surgeon: Sherilyn Cooter, MD;  Location: [place];  Service: Orthopedics;  Laterality: Right;   ARTHROSCOPIC REPAIR ACL Left    AV FISTULA PLACEMENT Right 05/31/2019   Procedure: Brachiocephalic AV fistula creation;  Surgeon: Lucky Cowboy,  Erskine Squibb, MD;  Location: [name] ORS;  Service: Vascular;  Laterality: Right;   COLONOSCOPY WITH PROPOFOL N/A 10/28/2015   Procedure: COLONOSCOPY WITH PROPOFOL;  Surgeon: Lollie Sails, MD;  Location: [facility] ENDOSCOPY;  Service: Endoscopy;  Laterality: N/A;   COLONOSCOPY WITH PROPOFOL N/A 10/29/2015   Procedure: COLONOSCOPY WITH PROPOFOL;  Surgeon: Lollie Sails, MD;  Location: [facility] ENDOSCOPY;  Service: Endoscopy;  Laterality: N/A;   CORONARY/GRAFT ACUTE MI REVASCULARIZATION N/A 02/26/2020   Procedure: Coronary/Graft Acute MI Revascularization;  Surgeon: Jettie Booze, MD;  Location: [place] CV LAB;  Service: Cardiovascular;  Laterality: N/A;   DIALYSIS/PERMA CATHETER INSERTION Right 04/26/2019   Perm Cath    DIALYSIS/PERMA CATHETER INSERTION N/A 04/26/2019   Procedure: DIALYSIS/PERMA CATHETER INSERTION;  Surgeon: Algernon Huxley, MD;  Location: [place] CV LAB;  Service:  Cardiovascular;  Laterality: N/A;   DIALYSIS/PERMA CATHETER INSERTION N/A 05/25/2021   Procedure: DIALYSIS/PERMA CATHETER INSERTION;  Surgeon: Algernon Huxley, MD;  Location: [place] CV LAB;  Service: Cardiovascular;  Laterality: N/A;   DIALYSIS/PERMA CATHETER REMOVAL N/A 08/15/2019   Procedure: DIALYSIS/PERMA CATHETER REMOVAL;  Surgeon: Katha Cabal, MD;  Location: [place] CV LAB;  Service: Cardiovascular;  Laterality: N/A;   EMBOLIZATION Right 02/06/2021   Procedure: EMBOLIZATION;  Surgeon: Algernon Huxley, MD;  Location: [place] CV LAB;  Service: Cardiovascular;  Laterality: Right;  Right Upper Extremity Dialysis Access, Permcath Placement   ESOPHAGOGASTRODUODENOSCOPY (EGD) WITH PROPOFOL N/A 12/27/2017   Procedure: ESOPHAGOGASTRODUODENOSCOPY (EGD) WITH PROPOFOL;  Surgeon: [place], Benay Pike, MD;  Location: [name] ENDOSCOPY;  Service: Gastroenterology;  Laterality: N/A;   EYE SURGERY     INTRAVASCULAR ULTRASOUND/IVUS N/A 02/26/2020   Procedure: Intravascular Ultrasound/IVUS;  Surgeon: Jettie Booze, MD;  Location: [place] CV LAB;  Service: Cardiovascular;  Laterality: N/A;   LEFT HEART CATH AND CORONARY ANGIOGRAPHY N/A 02/26/2020   Procedure: LEFT HEART CATH AND CORONARY ANGIOGRAPHY;  Surgeon: Jettie Booze, MD;  Location: [place] CV LAB;  Service: Cardiovascular;  Laterality: N/A;   PROSTATE SURGERY  2016   TONSILECTOMY/ADENOIDECTOMY WITH MYRINGOTOMY     TONSILLECTOMY     UPPER EXTREMITY ANGIOGRAPHY Right 11/26/2020   Procedure: Upper Extremity Angiography;  Surgeon: Algernon Huxley, MD;  Location: [place] CV LAB;  Service: Cardiovascular;  Laterality: Right;   UPPER EXTREMITY ANGIOGRAPHY Right 02/05/2021   Procedure: UPPER EXTREMITY ANGIOGRAPHY;  Surgeon: Algernon Huxley, MD;  Location: [place] CV LAB;  Service: Cardiovascular;  Laterality: Right;   Family History  Problem Relation Age of Onset   CAD Father    Stroke Father    Diabetes Mellitus  II Mother    Kidney failure Mother    Schizophrenia Mother    Social History   Tobacco Use   Smoking status: Never   Smokeless tobacco: Never  Vaping Use   Vaping Use: Never used  Substance Use Topics   Alcohol use: No   Drug use: No    Pertinent Clinical Results:  LABS:   Lab Results  Component Value Date   WBC 8.4 07/30/2021   HGB 8.4 (L) 07/30/2021   HCT 28.8 (L) 07/30/2021   MCV 73.5 (L) 07/30/2021   PLT 132 (L) 07/30/2021   Lab Results  Component Value Date   NA 135 07/30/2021   K 4.7 07/30/2021   CO2 25 07/30/2021   GLUCOSE 81 07/30/2021   BUN 60 (H) 07/30/2021   CREATININE 9.77 (H) 07/30/2021   CALCIUM 8.5 (L) 07/30/2021  GFRNONAA 6 (L) 07/30/2021    ECG: Date: 08/10/2021 Time ECG obtained: 0818 AM Rate: 66 bpm Rhythm:  Normal sinus rhythm; LVH with repolarization abnormality Axis (leads I and aVF): Normal Intervals: PR 156 ms. QRS 102 ms. QTc 454 ms. ST segment and T wave changes: No evidence of acute ST segment elevation or depression Comparison: Similar to previous tracing obtained on 07/28/2021   IMAGING / PROCEDURES: DIAGNOSTIC RADIOGRAPHS OF CHEST PORTABLE 1 VIEW performed on 07/27/2021 LEFT chest wall dialysis catheter was identified with tip in the cavoatrial junction. Stable cardiac enlargement. No signs of pleural effusion or edema.   No airspace opacities.  TRANSTHORACIC ECHOCARDIOGRAM performed on 04/22/2021 Left ventricular ejection fraction, by estimation, is 55 to 60%. The left ventricle has normal function. The left ventricle has no regional wall motion abnormalities. There is severe concentric left ventricular hypertrophy. Left ventricular diastolic parameters are consistent with Grade I diastolic dysfunction (impaired relaxation). Right ventricular systolic function is normal. The right ventricular size is normal. Tricuspid regurgitation signal is inadequate for assessing PA pressure.  Left atrial size was severely  dilated. Moderate pericardial effusion.  The mitral valve is grossly normal. Trivial mitral valve regurgitation. No evidence of mitral stenosis.  The aortic valve is tricuspid. Aortic valve regurgitation is not visualized. Aortic dilatation noted. There is mild dilatation of the aortic root, measuring 44 mm.  The inferior vena cava is normal in size with greater than 50% respiratory variability, suggesting right atrial pressure of 3 mmHg.  LEFT HEART CATHETERIZATION AND CORONARY ANGIOGRAPHY performed on 02/26/2020 Normal left ventricular systolic function with an EF of 65% LVEDP 11 mmHg despite 2.4 L normal saline during transport.  Question dehydration after 3 L fluid removal during hemodialysis. No aortic valve stenosis Single-vessel CAD 90% stenosis of the mid LAD Successful PCI 3.5 x 30 mm resolute Onyx stent, post dilated to greater than 4 mm, placed to the mid LAD resulting in 0% residual stenosis and TIMI-3 flow   Impression and Plan:  [name] [name]. has been referred for pre-anesthesia review and clearance prior to him undergoing the planned anesthetic and procedural courses. Available labs, pertinent testing, and imaging results were personally reviewed by me. This patient has been appropriately cleared by cardiology with an overall ACCEPTABLE risk of significant perioperative cardiovascular complications.  Based on clinical review performed today (08/11/21), barring any significant acute changes in the patient's overall condition, it is anticipated that he will be able to proceed with the planned surgical intervention. Any acute changes in clinical condition may necessitate his procedure being postponed and/or cancelled. Patient will meet with anesthesia team (MD and/or CRNA) on the day of his procedure for preoperative evaluation/assessment. Questions regarding anesthetic course will be fielded at that time.   Pre-surgical instructions were reviewed with the patient during his  PAT appointment and questions were fielded by PAT clinical staff. Patient was advised that if any questions or concerns arise prior to his procedure then he should return a call to PAT and/or his surgeon's office to discuss.  Honor Loh, MSN, APRN, FNP-C, CEN [facility]  Peri-operative Services Nurse Practitioner Phone: [phone] Fax: [phone] 08/11/21 1:46 PM  NOTE: This note has been prepared using Dragon dictation software. Despite my best ability to proofread, there is always the potential that unintentional transcriptional errors may still occur from this process.

## 2021-08-12 ENCOUNTER — Other Ambulatory Visit (INDEPENDENT_AMBULATORY_CARE_PROVIDER_SITE_OTHER): Payer: Self-pay | Admitting: Nurse Practitioner

## 2021-08-12 ENCOUNTER — Encounter
Admission: RE | Admit: 2021-08-12 | Discharge: 2021-08-12 | Disposition: A | Discharge status: Home / Self Care | Payer: Medicare HMO | Source: Ambulatory Visit | Attending: Vascular Surgery | Admitting: Vascular Surgery

## 2021-08-12 DIAGNOSIS — N186 End stage renal disease: Secondary | ICD-10-CM | POA: Diagnosis not present

## 2021-08-12 DIAGNOSIS — Z01812 Encounter for preprocedural laboratory examination: Secondary | ICD-10-CM | POA: Diagnosis present

## 2021-08-12 DIAGNOSIS — Z992 Dependence on renal dialysis: Secondary | ICD-10-CM | POA: Diagnosis not present

## 2021-08-12 DIAGNOSIS — Z01818 Encounter for other preprocedural examination: Secondary | ICD-10-CM

## 2021-08-12 LAB — PROTIME-INR
INR: 1.3 — ABNORMAL HIGH (ref 0.8–1.2)
Prothrombin Time: 15.9 seconds — ABNORMAL HIGH (ref 11.4–15.2)

## 2021-08-12 LAB — TYPE AND SCREEN
ABO/RH(D): A POS
Antibody Screen: NEGATIVE

## 2021-08-12 LAB — APTT: aPTT: 42 seconds — ABNORMAL HIGH (ref 24–36)

## 2021-08-12 MED ORDER — SODIUM CHLORIDE 0.9 % IV SOLN: INTRAVENOUS | Status: DC

## 2021-08-12 MED ORDER — CHLORHEXIDINE GLUCONATE CLOTH 2 % EX PADS: 6.0000 | MEDICATED_PAD | Freq: Once | CUTANEOUS | Status: DC

## 2021-08-12 MED ORDER — ORAL CARE MOUTH RINSE: 15.0000 mL | Freq: Once | OROMUCOSAL | Status: AC

## 2021-08-12 MED ORDER — CEFAZOLIN SODIUM-DEXTROSE 2-4 GM/100ML-% IV SOLN
2.0000 g | INTRAVENOUS | Status: AC
  Administered 2021-08-13: 2 g via INTRAVENOUS

## 2021-08-12 MED ORDER — FAMOTIDINE 20 MG PO TABS
20.0000 mg | ORAL_TABLET | Freq: Once | ORAL | Status: AC
  Administered 2021-08-13: 20 mg via ORAL

## 2021-08-12 MED ORDER — CHLORHEXIDINE GLUCONATE 0.12 % MT SOLN
15.0000 mL | Freq: Once | OROMUCOSAL | Status: AC
  Administered 2021-08-13: 15 mL via OROMUCOSAL

## 2021-08-13 ENCOUNTER — Other Ambulatory Visit: Payer: Self-pay

## 2021-08-13 ENCOUNTER — Ambulatory Visit: Payer: Medicare HMO | Admitting: Urgent Care

## 2021-08-13 ENCOUNTER — Ambulatory Visit: Payer: Medicare HMO

## 2021-08-13 ENCOUNTER — Encounter
Admission: RE | Disposition: A | Discharge status: Home / Self Care | Payer: Self-pay | Source: Home / Self Care | Attending: Vascular Surgery

## 2021-08-13 ENCOUNTER — Encounter: Payer: Self-pay | Admitting: Vascular Surgery

## 2021-08-13 ENCOUNTER — Ambulatory Visit
Admission: RE | Admit: 2021-08-13 | Discharge: 2021-08-13 | Disposition: A | Discharge status: Home / Self Care | Payer: Medicare HMO | Attending: Vascular Surgery | Admitting: Vascular Surgery

## 2021-08-13 DIAGNOSIS — Z79899 Other long term (current) drug therapy: Secondary | ICD-10-CM | POA: Insufficient documentation

## 2021-08-13 DIAGNOSIS — E1151 Type 2 diabetes mellitus with diabetic peripheral angiopathy without gangrene: Secondary | ICD-10-CM | POA: Insufficient documentation

## 2021-08-13 DIAGNOSIS — E1122 Type 2 diabetes mellitus with diabetic chronic kidney disease: Secondary | ICD-10-CM | POA: Diagnosis not present

## 2021-08-13 DIAGNOSIS — N186 End stage renal disease: Secondary | ICD-10-CM | POA: Insufficient documentation

## 2021-08-13 DIAGNOSIS — R569 Unspecified convulsions: Secondary | ICD-10-CM | POA: Insufficient documentation

## 2021-08-13 DIAGNOSIS — Z8673 Personal history of transient ischemic attack (TIA), and cerebral infarction without residual deficits: Secondary | ICD-10-CM | POA: Diagnosis not present

## 2021-08-13 DIAGNOSIS — E875 Hyperkalemia: Secondary | ICD-10-CM | POA: Insufficient documentation

## 2021-08-13 DIAGNOSIS — Z7901 Long term (current) use of anticoagulants: Secondary | ICD-10-CM | POA: Diagnosis not present

## 2021-08-13 DIAGNOSIS — Z992 Dependence on renal dialysis: Secondary | ICD-10-CM | POA: Insufficient documentation

## 2021-08-13 DIAGNOSIS — I509 Heart failure, unspecified: Secondary | ICD-10-CM | POA: Diagnosis not present

## 2021-08-13 DIAGNOSIS — D72829 Elevated white blood cell count, unspecified: Secondary | ICD-10-CM | POA: Insufficient documentation

## 2021-08-13 DIAGNOSIS — E785 Hyperlipidemia, unspecified: Secondary | ICD-10-CM | POA: Insufficient documentation

## 2021-08-13 DIAGNOSIS — I132 Hypertensive heart and chronic kidney disease with heart failure and with stage 5 chronic kidney disease, or end stage renal disease: Secondary | ICD-10-CM | POA: Insufficient documentation

## 2021-08-13 DIAGNOSIS — Z01818 Encounter for other preprocedural examination: Secondary | ICD-10-CM

## 2021-08-13 HISTORY — DX: Gastroparesis: K31.84

## 2021-08-13 HISTORY — PX: AV FISTULA PLACEMENT: SHX1204

## 2021-08-13 HISTORY — DX: Unspecified convulsions: R56.9

## 2021-08-13 HISTORY — DX: Benign prostatic hyperplasia without lower urinary tract symptoms: N40.0

## 2021-08-13 HISTORY — DX: Unspecified atrial fibrillation: I48.91

## 2021-08-13 HISTORY — DX: Long term (current) use of antithrombotics/antiplatelets: Z79.02

## 2021-08-13 HISTORY — DX: Dependence on renal dialysis: Z99.2

## 2021-08-13 HISTORY — DX: End stage renal disease: N18.6

## 2021-08-13 HISTORY — DX: Long term (current) use of anticoagulants: Z79.01

## 2021-08-13 LAB — POCT I-STAT, CHEM 8
BUN: 55 mg/dL — ABNORMAL HIGH (ref 6–20)
Calcium, Ion: 1.14 mmol/L — ABNORMAL LOW (ref 1.15–1.40)
Chloride: 110 mmol/L (ref 98–111)
Creatinine, Ser: 12.3 mg/dL — ABNORMAL HIGH (ref 0.61–1.24)
Glucose, Bld: 81 mg/dL (ref 70–99)
HCT: 29 % — ABNORMAL LOW (ref 39.0–52.0)
Hemoglobin: 9.9 g/dL — ABNORMAL LOW (ref 13.0–17.0)
Potassium: 4.2 mmol/L (ref 3.5–5.1)
Sodium: 142 mmol/L (ref 135–145)
TCO2: 21 mmol/L — ABNORMAL LOW (ref 22–32)

## 2021-08-13 LAB — GLUCOSE, CAPILLARY: Glucose-Capillary: 77 mg/dL (ref 70–99)

## 2021-08-13 SURGERY — ARTERIOVENOUS (AV) FISTULA CREATION
Anesthesia: Regional | Site: Arm Upper | Laterality: Left

## 2021-08-13 MED ORDER — FENTANYL CITRATE PF 50 MCG/ML IJ SOSY
PREFILLED_SYRINGE | INTRAMUSCULAR | Status: AC
  Administered 2021-08-13: 50 ug via INTRAVENOUS
  Filled 2021-08-13: qty 1

## 2021-08-13 MED ORDER — CHLORHEXIDINE GLUCONATE 0.12 % MT SOLN
OROMUCOSAL | Status: AC
  Filled 2021-08-13: qty 15

## 2021-08-13 MED ORDER — MIDAZOLAM HCL 2 MG/2ML IJ SOLN
INTRAMUSCULAR | Status: AC
  Administered 2021-08-13: 1 mg via INTRAVENOUS
  Filled 2021-08-13: qty 2

## 2021-08-13 MED ORDER — LACTATED RINGERS IV SOLN
INTRAVENOUS | Status: DC
Start: 2021-08-13 — End: 2021-08-13

## 2021-08-13 MED ORDER — FENTANYL CITRATE (PF) 100 MCG/2ML IJ SOLN: 25.0000 ug | INTRAMUSCULAR | Status: DC | PRN

## 2021-08-13 MED ORDER — HEPARIN SODIUM (PORCINE) 5000 UNIT/ML IJ SOLN
INTRAMUSCULAR | Status: AC
  Filled 2021-08-13: qty 1

## 2021-08-13 MED ORDER — FAMOTIDINE 20 MG PO TABS
ORAL_TABLET | ORAL | Status: AC
  Filled 2021-08-13: qty 1

## 2021-08-13 MED ORDER — ACETAMINOPHEN 10 MG/ML IV SOLN
INTRAVENOUS | Status: DC | PRN
  Administered 2021-08-13: 750 mg via INTRAVENOUS

## 2021-08-13 MED ORDER — GLYCOPYRROLATE 0.2 MG/ML IJ SOLN
INTRAMUSCULAR | Status: DC | PRN
  Administered 2021-08-13: .1 mg via INTRAVENOUS

## 2021-08-13 MED ORDER — MIDAZOLAM HCL 2 MG/2ML IJ SOLN
1.0000 mg | Freq: Once | INTRAMUSCULAR | Status: AC
Start: 2021-08-13 — End: 2021-08-13

## 2021-08-13 MED ORDER — HEPARIN SODIUM (PORCINE) 1000 UNIT/ML IJ SOLN
INTRAMUSCULAR | Status: DC | PRN
  Administered 2021-08-13: 3000 [IU] via INTRAVENOUS

## 2021-08-13 MED ORDER — ACETAMINOPHEN 10 MG/ML IV SOLN: 1000.0000 mg | Freq: Once | INTRAVENOUS | Status: DC | PRN

## 2021-08-13 MED ORDER — PROPOFOL 10 MG/ML IV BOLUS
INTRAVENOUS | Status: AC
  Filled 2021-08-13: qty 20

## 2021-08-13 MED ORDER — PROPOFOL 500 MG/50ML IV EMUL
INTRAVENOUS | Status: DC | PRN
  Administered 2021-08-13: 50 ug/kg/min via INTRAVENOUS

## 2021-08-13 MED ORDER — OXYCODONE HCL 5 MG PO TABS: 5.0000 mg | ORAL_TABLET | Freq: Once | ORAL | Status: DC | PRN

## 2021-08-13 MED ORDER — BUPIVACAINE HCL (PF) 0.5 % IJ SOLN
INTRAMUSCULAR | Status: AC
  Filled 2021-08-13: qty 30

## 2021-08-13 MED ORDER — EPHEDRINE 5 MG/ML INJ
INTRAVENOUS | Status: AC
  Filled 2021-08-13: qty 5

## 2021-08-13 MED ORDER — PROPOFOL 10 MG/ML IV BOLUS
INTRAVENOUS | Status: DC | PRN
  Administered 2021-08-13: 10 mg via INTRAVENOUS
  Administered 2021-08-13: 20 mg via INTRAVENOUS

## 2021-08-13 MED ORDER — CEFAZOLIN SODIUM-DEXTROSE 2-4 GM/100ML-% IV SOLN
INTRAVENOUS | Status: AC
  Filled 2021-08-13: qty 100

## 2021-08-13 MED ORDER — SODIUM CHLORIDE 0.9 % IV SOLN
INTRAVENOUS | Status: DC | PRN
  Administered 2021-08-13: 501 mL

## 2021-08-13 MED ORDER — PHENYLEPHRINE 80 MCG/ML (10ML) SYRINGE FOR IV PUSH (FOR BLOOD PRESSURE SUPPORT)
PREFILLED_SYRINGE | INTRAVENOUS | Status: AC
  Filled 2021-08-13: qty 10

## 2021-08-13 MED ORDER — BUPIVACAINE HCL (PF) 0.5 % IJ SOLN
INTRAMUSCULAR | Status: DC | PRN
  Administered 2021-08-13: 30 mL

## 2021-08-13 MED ORDER — OXYCODONE HCL 5 MG/5ML PO SOLN: 5.0000 mg | Freq: Once | ORAL | Status: DC | PRN

## 2021-08-13 MED ORDER — PHENYLEPHRINE HCL (PRESSORS) 10 MG/ML IV SOLN
INTRAVENOUS | Status: DC | PRN
  Administered 2021-08-13: 80 ug via INTRAVENOUS

## 2021-08-13 MED ORDER — PROPOFOL 1000 MG/100ML IV EMUL
INTRAVENOUS | Status: AC
  Filled 2021-08-13: qty 100

## 2021-08-13 MED ORDER — FENTANYL CITRATE PF 50 MCG/ML IJ SOSY: 50.0000 ug | PREFILLED_SYRINGE | Freq: Once | INTRAMUSCULAR | Status: AC

## 2021-08-13 MED ORDER — ONDANSETRON HCL 4 MG/2ML IJ SOLN
INTRAMUSCULAR | Status: AC
  Filled 2021-08-13: qty 2

## 2021-08-13 MED ORDER — HYDROMORPHONE HCL 1 MG/ML IJ SOLN: 1.0000 mg | Freq: Once | INTRAMUSCULAR | Status: DC | PRN

## 2021-08-13 MED ORDER — EPHEDRINE SULFATE (PRESSORS) 50 MG/ML IJ SOLN
INTRAMUSCULAR | Status: DC | PRN
  Administered 2021-08-13: 10 mg via INTRAVENOUS
  Administered 2021-08-13: 5 mg via INTRAVENOUS
  Administered 2021-08-13: 10 mg via INTRAVENOUS

## 2021-08-13 MED ORDER — ACETAMINOPHEN 10 MG/ML IV SOLN
INTRAVENOUS | Status: AC
  Filled 2021-08-13: qty 100

## 2021-08-13 MED ORDER — HEPARIN SODIUM (PORCINE) 1000 UNIT/ML IJ SOLN
INTRAMUSCULAR | Status: AC
  Filled 2021-08-13: qty 10

## 2021-08-13 MED ORDER — HYDROCODONE-ACETAMINOPHEN 5-325 MG PO TABS: 2.0000 | ORAL_TABLET | Freq: Four times a day (QID) | ORAL | 0 refills | Status: DC | PRN

## 2021-08-13 SURGICAL SUPPLY — 54 items
ADH SKN CLS APL DERMABOND .7 (GAUZE/BANDAGES/DRESSINGS) ×1
APL PRP STRL LF DISP 70% ISPRP (MISCELLANEOUS) ×1
BAG DECANTER FOR FLEXI CONT (MISCELLANEOUS) ×2 IMPLANT
BLADE SURG SZ11 CARB STEEL (BLADE) ×2 IMPLANT
BOOT SUTURE AID YELLOW STND (SUTURE) ×2 IMPLANT
BRUSH SCRUB EZ  4% CHG (MISCELLANEOUS) ×1
BRUSH SCRUB EZ 4% CHG (MISCELLANEOUS) ×1 IMPLANT
CHLORAPREP W/TINT 26 (MISCELLANEOUS) ×2 IMPLANT
CLIP SPRNG 6 S-JAW DBL (CLIP) ×1 IMPLANT
CLIP SPRNG 6MM S-JAW DBL (CLIP) ×2
DERMABOND ADVANCED (GAUZE/BANDAGES/DRESSINGS) ×1
DERMABOND ADVANCED .7 DNX12 (GAUZE/BANDAGES/DRESSINGS) ×1 IMPLANT
ELECT CAUTERY BLADE 6.4 (BLADE) ×2 IMPLANT
ELECT REM PT RETURN 9FT ADLT (ELECTROSURGICAL) ×2
ELECTRODE REM PT RTRN 9FT ADLT (ELECTROSURGICAL) ×1 IMPLANT
GEL ULTRASOUND 20GR AQUASONIC (MISCELLANEOUS) IMPLANT
GLOVE BIO SURGEON STRL SZ7 (GLOVE) ×2 IMPLANT
GOWN STRL REUS W/ TWL LRG LVL3 (GOWN DISPOSABLE) ×1 IMPLANT
GOWN STRL REUS W/ TWL XL LVL3 (GOWN DISPOSABLE) ×2 IMPLANT
GOWN STRL REUS W/TWL LRG LVL3 (GOWN DISPOSABLE) ×2
GOWN STRL REUS W/TWL XL LVL3 (GOWN DISPOSABLE) ×4
HEMOSTAT SURGICEL 2X3 (HEMOSTASIS) ×2 IMPLANT
IV NS 500ML (IV SOLUTION) ×2
IV NS 500ML BAXH (IV SOLUTION) ×1 IMPLANT
KIT TURNOVER KIT A (KITS) ×2 IMPLANT
LABEL OR SOLS (LABEL) ×2 IMPLANT
LOOP RED MAXI  1X406MM (MISCELLANEOUS) ×1
LOOP VESSEL MAXI 1X406 RED (MISCELLANEOUS) ×1 IMPLANT
LOOP VESSEL MINI 0.8X406 BLUE (MISCELLANEOUS) ×1 IMPLANT
LOOPS BLUE MINI 0.8X406MM (MISCELLANEOUS) ×1
MANIFOLD NEPTUNE II (INSTRUMENTS) ×2 IMPLANT
NDL FILTER BLUNT 18X1 1/2 (NEEDLE) ×1 IMPLANT
NEEDLE FILTER BLUNT 18X 1/2SAF (NEEDLE) ×1
NEEDLE FILTER BLUNT 18X1 1/2 (NEEDLE) ×1 IMPLANT
NS IRRIG 500ML POUR BTL (IV SOLUTION) ×2 IMPLANT
PACK EXTREMITY ARMC (MISCELLANEOUS) ×2 IMPLANT
PAD PREP 24X41 OB/GYN DISP (PERSONAL CARE ITEMS) ×2 IMPLANT
SOLUTION CELL SAVER (CLIP) ×1 IMPLANT
STOCKINETTE 48X4 2 PLY STRL (GAUZE/BANDAGES/DRESSINGS) ×1 IMPLANT
STOCKINETTE STRL 4IN 9604848 (GAUZE/BANDAGES/DRESSINGS) ×2 IMPLANT
SUT MNCRL AB 4-0 PS2 18 (SUTURE) ×2 IMPLANT
SUT PROLENE 6 0 BV (SUTURE) ×8 IMPLANT
SUT SILK 2 0 (SUTURE) ×2
SUT SILK 2-0 18XBRD TIE 12 (SUTURE) ×1 IMPLANT
SUT SILK 3 0 (SUTURE) ×2
SUT SILK 3-0 18XBRD TIE 12 (SUTURE) ×1 IMPLANT
SUT SILK 4 0 (SUTURE) ×2
SUT SILK 4-0 18XBRD TIE 12 (SUTURE) ×1 IMPLANT
SUT VIC AB 3-0 SH 27 (SUTURE) ×2
SUT VIC AB 3-0 SH 27X BRD (SUTURE) ×1 IMPLANT
SYR 20ML LL LF (SYRINGE) ×2 IMPLANT
SYR 3ML LL SCALE MARK (SYRINGE) ×2 IMPLANT
SYR TB 1ML 27GX1/2 LL (SYRINGE) IMPLANT
WATER STERILE IRR 500ML POUR (IV SOLUTION) ×2 IMPLANT

## 2021-08-13 NOTE — Transfer of Care (Signed)
Immediate Anesthesia Transfer of Care Note  Patient: [name].  Procedure(s) Performed: ARTERIOVENOUS (AV) FISTULA CREATION (BRACHIALCEPHALIC) (Left: Arm Upper)  Patient Location: PACU  Anesthesia Type:General  Level of Consciousness: awake, alert  and oriented  Airway & Oxygen Therapy: Patient Spontanous Breathing and Patient connected to face mask oxygen  Post-op Assessment: Report given to RN and Post -op Vital signs reviewed and stable  Post vital signs: Reviewed and stable  Last Vitals:  Vitals Value Taken Time  BP 128/91 08/13/21 1405  Temp    Pulse 81 08/13/21 1410  Resp 23 08/13/21 1410  SpO2 100 % 08/13/21 1410  Vitals shown include unvalidated device data.  Last Pain:  Vitals:   08/13/21 1147  TempSrc: Temporal  PainSc: 4          Complications: No notable events documented.

## 2021-08-13 NOTE — Discharge Instructions (Signed)
AMBULATORY SURGERY  ?DISCHARGE INSTRUCTIONS ? ? ?The drugs that you were given will stay in your system until tomorrow so for the next 24 hours you should not: ? ?Drive an automobile ?Make any legal decisions ?Drink any alcoholic beverage ? ? ?You may resume regular meals tomorrow.  Today it is better to start with liquids and gradually work up to solid foods. ? ?You may eat anything you prefer, but it is better to start with liquids, then soup and crackers, and gradually work up to solid foods. ? ? ?Please notify your doctor immediately if you have any unusual bleeding, trouble breathing, redness and pain at the surgery site, drainage, fever, or pain not relieved by medication. ? ? ? ?Additional Instructions: ? ? ? ?Please contact your physician with any problems or Same Day Surgery at [phone], Monday through Friday 6 am to 4 pm, or [place] at [place] Main number at [phone].  ?

## 2021-08-13 NOTE — Anesthesia Procedure Notes (Signed)
Anesthesia Regional Block: Supraclavicular block   Pre-Anesthetic Checklist: , timeout performed,  Correct Patient, Correct Site, Correct Laterality,  Correct Procedure,, site marked,  Risks and benefits discussed,  Surgical consent,  Pre-op evaluation,  At surgeon's request and post-op pain management  Laterality: Left  Prep: chloraprep       Needles:  Injection technique: Single-shot  Needle Type: Echogenic Needle          Additional Needles:   Procedures:,,,, ultrasound used (permanent image in chart),,   Motor weakness within 20 minutes.  Narrative:  Start time: 08/13/2021 12:29 PM End time: 08/13/2021 12:30 PM Injection made incrementally with aspirations every 5 mL.  Performed by: Personally  Anesthesiologist: Darrin Nipper, MD  Additional Notes: Functioning IV was confirmed and monitors applied.  Sterile prep and drape, hand hygiene and sterile gloves were used. Ultrasound guidance: relevant anatomy identified, needle position confirmed, local anesthetic spread visualized around nerve(s), vascular puncture avoided.  Image saved to electronic medical record.  Negative aspiration prior to incremental administration of local anesthetic for total 30 ml bupivacaine 0.5% given in supraclavicular distribution. The patient tolerated the procedure well. Vital signs and moderate sedation medications recorded in RN notes.

## 2021-08-13 NOTE — Anesthesia Procedure Notes (Signed)
Date/Time: 08/13/2021 1:03 PM  Performed by: Demetrius Charity, CRNAPre-anesthesia Checklist: Patient identified, Emergency Drugs available, Suction available, Patient being monitored and Timeout performed Patient Re-evaluated:Patient Re-evaluated prior to induction Oxygen Delivery Method: Simple face mask Induction Type: IV induction Placement Confirmation: CO2 detector and positive ETCO2

## 2021-08-14 ENCOUNTER — Other Ambulatory Visit: Payer: Self-pay

## 2021-08-14 ENCOUNTER — Encounter: Payer: Self-pay | Admitting: Vascular Surgery

## 2021-08-17 NOTE — Telephone Encounter (Signed)
Coming on board preop today, chart retained under CC'd charts. Per review pt went on to have surgery already last week so appears this was addressed by previous team; will remove from preop box.

## 2021-08-18 ENCOUNTER — Ambulatory Visit (INDEPENDENT_AMBULATORY_CARE_PROVIDER_SITE_OTHER): Payer: Medicare Other | Admitting: Nurse Practitioner

## 2021-08-18 ENCOUNTER — Encounter (INDEPENDENT_AMBULATORY_CARE_PROVIDER_SITE_OTHER): Payer: Medicare Other

## 2021-08-20 ENCOUNTER — Ambulatory Visit (INDEPENDENT_AMBULATORY_CARE_PROVIDER_SITE_OTHER): Payer: Medicare HMO | Admitting: Nurse Practitioner

## 2021-08-20 ENCOUNTER — Encounter (INDEPENDENT_AMBULATORY_CARE_PROVIDER_SITE_OTHER): Payer: Self-pay | Admitting: Nurse Practitioner

## 2021-08-20 ENCOUNTER — Other Ambulatory Visit (INDEPENDENT_AMBULATORY_CARE_PROVIDER_SITE_OTHER): Payer: Self-pay | Admitting: Nurse Practitioner

## 2021-08-20 ENCOUNTER — Other Ambulatory Visit (INDEPENDENT_AMBULATORY_CARE_PROVIDER_SITE_OTHER): Payer: Medicare HMO

## 2021-08-20 VITALS — BP 170/62 | HR 69 | Resp 16

## 2021-08-20 DIAGNOSIS — N186 End stage renal disease: Secondary | ICD-10-CM

## 2021-08-20 DIAGNOSIS — M7989 Other specified soft tissue disorders: Secondary | ICD-10-CM | POA: Diagnosis not present

## 2021-08-20 DIAGNOSIS — Z9889 Other specified postprocedural states: Secondary | ICD-10-CM

## 2021-08-20 MED ORDER — DOXYCYCLINE HYCLATE 100 MG PO CAPS: 100.0000 mg | ORAL_CAPSULE | Freq: Two times a day (BID) | ORAL | 0 refills | Status: DC

## 2021-08-20 MED ORDER — MUPIROCIN 2 % EX OINT: 1.0000 | TOPICAL_OINTMENT | Freq: Two times a day (BID) | CUTANEOUS | 0 refills | Status: DC

## 2021-08-26 ENCOUNTER — Encounter (INDEPENDENT_AMBULATORY_CARE_PROVIDER_SITE_OTHER): Payer: Self-pay | Admitting: Nurse Practitioner

## 2021-08-26 ENCOUNTER — Ambulatory Visit (INDEPENDENT_AMBULATORY_CARE_PROVIDER_SITE_OTHER): Payer: Medicare HMO | Admitting: Nurse Practitioner

## 2021-08-26 ENCOUNTER — Encounter (INDEPENDENT_AMBULATORY_CARE_PROVIDER_SITE_OTHER): Payer: Medicare HMO

## 2021-08-26 VITALS — BP 124/63 | HR 61 | Resp 17

## 2021-08-26 DIAGNOSIS — N186 End stage renal disease: Secondary | ICD-10-CM

## 2021-08-26 MED ORDER — DOXYCYCLINE HYCLATE 100 MG PO CAPS: 100.0000 mg | ORAL_CAPSULE | Freq: Two times a day (BID) | ORAL | 0 refills | Status: DC

## 2021-08-27 ENCOUNTER — Telehealth (INDEPENDENT_AMBULATORY_CARE_PROVIDER_SITE_OTHER): Payer: Self-pay

## 2021-08-27 NOTE — Telephone Encounter (Signed)
I attempted to contact the patient to schedule him for a left arm fistulagram and permcath exchange with Dr. Lucky Cowboy. A message was left for a return call.

## 2021-08-28 NOTE — Telephone Encounter (Signed)
Patient called back and is scheduled with Dr. Lucky Cowboy on 08/31/21 with a 11:45 am arrival time to the [facility]. Pre-procedure instructions were discussed and patient understood.

## 2021-08-30 ENCOUNTER — Encounter (INDEPENDENT_AMBULATORY_CARE_PROVIDER_SITE_OTHER): Payer: Self-pay | Admitting: Nurse Practitioner

## 2021-08-30 NOTE — Progress Notes (Signed)
Subjective:    Patient ID: [name]., male    DOB: May 25, 1970, 51 y.o.   MRN: [id number] No chief complaint on file.   The patient returns today with continued swelling in his left forearm.  The patient recently underwent placement of a left brachiocephalic AV fistula.  He notes that shortly thereafter he began to have significant swelling of his lower arm.  He notes that the incision Although it has not dehisced it is painful in nature.  The swelling and of itself is painful with his hand being at its worst.  He denies any fevers or chills.  He denies any signs symptoms significant with steal syndrome.    Review of Systems  Cardiovascular:        Arm swelling  All other systems reviewed and are negative.      Objective:   Physical Exam Vitals reviewed.  HENT:     Head: Normocephalic.  Cardiovascular:     Rate and Rhythm: Normal rate.  Pulmonary:     Effort: Pulmonary effort is normal.  Skin:    General: Skin is warm and dry.  Neurological:     Mental Status: He is alert and oriented to person, place, and time.  Psychiatric:        Mood and Affect: Mood normal.        Behavior: Behavior normal.        Thought Content: Thought content normal.        Judgment: Judgment normal.     BP 124/63 (BP Location: Right Arm)   Pulse 61   Resp 17   Past Medical History:  Diagnosis Date   Anxiety    a.) on BZO (clonazepam) PRN   Aortic dilatation ([place]) 11/25/2020   a.) TTE 11/25/2020: Ao root measured 38 mm. b.) TTE 04/22/2021: Ao root measured 44 mm.   Atrial fibrillation ([place])    a.) CHA2DS2-VASc = 6 (CHF, HTN, CVA x2, prior MI, T2DM). b.) rate/rhythm maintained on oral amiodarone + carvedilol; chronically anticoagulated using dose reduced apixaban + clopidogrel   BPH (benign prostatic hyperplasia)    Cardiac arrest ([place]) 07/26/2021   a.) in setting of hyperkalemia, NSTEMI, and seizure following missed HD; pulseless and apneic --> required 1 round of CPR prior to  ROSC   Cellulitis    CHF (congestive heart failure) ([place]) 07/19/2017   a.) TTE 07/19/2017: EF 75%; sev LVH; mild MR/PR/TR; G1DD. b.) TTE 11/25/2020: EF 55-60%; mod LVH; GLS -13.0; mild-mod BAE; triv MR; mild dilated PA; G2DD. c.) TTE 04/22/2021: EF 55-60%; sev LVH; sev LAE, triv MR; G1DD.   Chronic diarrhea    Chronic pain of both ankles    Coronary artery disease 02/26/2020   a.) anterior STEMI 02/26/2020 --> LHC/PCI --> EF 65%; 90% mLAD (3.5 x 30 mm Resolyte Onyx DES x 1). b.) NSTEMI 07/26/2021   Diabetes mellitus, type II ([place])    Diabetic neuropathy ([place])    Esophagitis    ESRD (end stage renal disease) on dialysis ([place])    a.) [facility]; T-Th-Sat   Frequent falls    Gait instability    Gastritis    Gastroparesis    GERD (gastroesophageal reflux disease)    H/O enucleation of left eyeball    Heart palpitations    History of kidney stones    HLD (hyperlipidemia)    Hypertension    Insomnia    a.) uses melatonin PRN   Long term current use of anticoagulant    a.)  dose reduced apixaban   Long term current use of antithrombotics/antiplatelets    a.) clopidogrel   Nausea and vomiting in adult    recurrent   NSTEMI (non-ST elevated myocardial infarction) ([place]) 07/26/2021   a.) hyperkalemic from missed HD; seizures; decompensated to cardiac arrest and required CPR x 1 round prior to ROSC.   Seizure ([place])    a.) last 07/26/2021 in setting of missed HD --> hyperkalemic at 6.5 --> pulseless/apneic and required CPR; discharged home on levetiracetam.   ST elevation myocardial infarction (STEMI) of anterior wall ([place]) 02/26/2020   a.) LHC/PCI 02/26/2020 --> EF 65%; LVEDP 11 mmHg; 90% mLAD (3.5 x 20 mm Resolute Onyx DES x 1)   Stroke ([place]) 04/2021   a.) documented as x 3 between 04/2021 and 06/2021    Social History   Socioeconomic History   Marital status: Married    Spouse name: [name]    Number of children: 2   Years of education: Not on file   Highest education level: High  school graduate  Occupational History   Occupation: Disability    Comment: not employed  Tobacco Use   Smoking status: Never   Smokeless tobacco: Never  Vaping Use   Vaping Use: Never used  Substance and Sexual Activity   Alcohol use: No   Drug use: No   Sexual activity: Yes  Other Topics Concern   Not on file  Social History Narrative   Oldest son killed in car crash June 2020.    Social Determinants of Health   Financial Resource Strain: High Risk (07/05/2017)   Overall Financial Resource Strain (CARDIA)    Difficulty of Paying Living Expenses: Hard  Food Insecurity: Food Insecurity Present (07/05/2017)   Hunger Vital Sign    Worried About Running Out of Food in the Last Year: Often true    Ran Out of Food in the Last Year: Often true  Transportation Needs: No Transportation Needs (02/28/2020)   PRAPARE - Hydrologist (Medical): No    Lack of Transportation (Non-Medical): No  Physical Activity: Inactive (07/05/2017)   Exercise Vital Sign    Days of Exercise per Week: 0 days    Minutes of Exercise per Session: 0 min  Stress: Stress Concern Present (07/05/2017)   [place]    Feeling of Stress : Rather much  Social Connections: Moderately Integrated (07/05/2017)   Social Connection and Isolation Panel [NHANES]    Frequency of Communication with Friends and Family: Three times a week    Frequency of Social Gatherings with Friends and Family: Once a week    Attends Religious Services: More than 4 times per year    Active Member of [employer] or Organizations: No    Attends Archivist Meetings: Never    Marital Status: Married  Human resources officer Violence: Not At Risk (07/05/2017)   Humiliation, Afraid, Rape, and Kick questionnaire    Fear of Current or Ex-Partner: No    Emotionally Abused: No    Physically Abused: No    Sexually Abused: No    Past Surgical History:  Procedure  Laterality Date   AMPUTATION Left 03/16/2016   Procedure: [place];  Surgeon: Trula Slade, DPM;  Location: [facility];  Service: Podiatry;  Laterality: Left;  can start around 5    AMPUTATION Right 02/09/2021   Procedure: AMPUTATION DIGIT;  Surgeon: Sherilyn Cooter, MD;  Location: [facility];  Service:  Orthopedics;  Laterality: Right;   ARTHROSCOPIC REPAIR ACL Left    AV FISTULA PLACEMENT Right 05/31/2019   Procedure: Brachiocephalic AV fistula creation;  Surgeon: Algernon Huxley, MD;  Location: [name];  Service: Vascular;  Laterality: Right;   AV FISTULA PLACEMENT Left 08/13/2021   Procedure: ARTERIOVENOUS (AV) FISTULA CREATION (BRACHIALCEPHALIC);  Surgeon: Algernon Huxley, MD;  Location: [name];  Service: Vascular;  Laterality: Left;   COLONOSCOPY WITH PROPOFOL N/A 10/28/2015   Procedure: COLONOSCOPY WITH PROPOFOL;  Surgeon: Lollie Sails, MD;  Location: [facility];  Service: Endoscopy;  Laterality: N/A;   COLONOSCOPY WITH PROPOFOL N/A 10/29/2015   Procedure: COLONOSCOPY WITH PROPOFOL;  Surgeon: Lollie Sails, MD;  Location: [facility] ENDOSCOPY;  Service: Endoscopy;  Laterality: N/A;   CORONARY/GRAFT ACUTE MI REVASCULARIZATION N/A 02/26/2020   Procedure: Coronary/Graft Acute MI Revascularization;  Surgeon: Jettie Booze, MD;  Location: [facility];  Service: Cardiovascular;  Laterality: N/A;   DIALYSIS/PERMA CATHETER INSERTION Right 04/26/2019   Perm Cath    DIALYSIS/PERMA CATHETER INSERTION N/A 04/26/2019   Procedure: DIALYSIS/PERMA CATHETER INSERTION;  Surgeon: Algernon Huxley, MD;  Location: [place] [facility];  Service: Cardiovascular;  Laterality: N/A;   DIALYSIS/PERMA CATHETER INSERTION N/A 05/25/2021   Procedure: DIALYSIS/PERMA CATHETER INSERTION;  Surgeon: Algernon Huxley, MD;  Location: [place] [facility];  Service: Cardiovascular;  Laterality: N/A;   DIALYSIS/PERMA CATHETER REMOVAL N/A 08/15/2019   Procedure: DIALYSIS/PERMA CATHETER REMOVAL;   Surgeon: Katha Cabal, MD;  Location: [place] [facility];  Service: Cardiovascular;  Laterality: N/A;   EMBOLIZATION Right 02/06/2021   Procedure: EMBOLIZATION;  Surgeon: Algernon Huxley, MD;  Location: [place] [facility];  Service: Cardiovascular;  Laterality: Right;  Right Upper Extremity Dialysis Access, Permcath Placement   ESOPHAGOGASTRODUODENOSCOPY (EGD) WITH PROPOFOL N/A 12/27/2017   Procedure: ESOPHAGOGASTRODUODENOSCOPY (EGD) WITH PROPOFOL;  Surgeon: [place], Benay Pike, MD;  Location: [name] ENDOSCOPY;  Service: Gastroenterology;  Laterality: N/A;   EYE SURGERY     INTRAVASCULAR ULTRASOUND/IVUS N/A 02/26/2020   Procedure: Intravascular Ultrasound/IVUS;  Surgeon: Jettie Booze, MD;  Location: [place] [facility];  Service: Cardiovascular;  Laterality: N/A;   LEFT HEART CATH AND CORONARY ANGIOGRAPHY N/A 02/26/2020   Procedure: LEFT HEART CATH AND CORONARY ANGIOGRAPHY;  Surgeon: Jettie Booze, MD;  Location: [place] [facility];  Service: Cardiovascular;  Laterality: N/A;   PROSTATE SURGERY  2016   TONSILECTOMY/ADENOIDECTOMY WITH MYRINGOTOMY     TONSILLECTOMY     UPPER EXTREMITY ANGIOGRAPHY Right 11/26/2020   Procedure: Upper Extremity Angiography;  Surgeon: Algernon Huxley, MD;  Location: [place] [facility];  Service: Cardiovascular;  Laterality: Right;   UPPER EXTREMITY ANGIOGRAPHY Right 02/05/2021   Procedure: UPPER EXTREMITY ANGIOGRAPHY;  Surgeon: Algernon Huxley, MD;  Location: [place] [facility];  Service: Cardiovascular;  Laterality: Right;    Family History  Problem Relation Age of Onset   CAD Father    Stroke Father    Diabetes Mellitus II Mother    Kidney failure Mother    Schizophrenia Mother     Allergies  Allergen Reactions   Levofloxacin Swelling    Facial swelling Other reaction(s): swelling   Promethazine Diarrhea and Other (See Comments)    Muscle cramps   Promethazine Hcl Other (See Comments)    Cramping all over  Other reaction(s): cramp all  over   Other     Other reaction(s): Unknown Other reaction(s): Unknown   Malt Other (See Comments)  Cramping       Latest Ref Rng & Units 08/13/2021   11:56 AM 07/30/2021    8:48 AM 07/29/2021    5:24 AM  CBC  WBC 4.0 - 10.5 K/uL  8.4  9.9   Hemoglobin 13.0 - 17.0 g/dL 9.9  8.4  8.4   Hematocrit 39.0 - 52.0 % 29.0  28.8  27.8   Platelets 150 - 400 K/uL  132  111       CMP     Component Value Date/Time   NA 142 08/13/2021 1156   K 4.2 08/13/2021 1156   CL 110 08/13/2021 1156   CO2 25 07/30/2021 0848   GLUCOSE 81 08/13/2021 1156   BUN 55 (H) 08/13/2021 1156   CREATININE 12.30 (H) 08/13/2021 1156   CALCIUM 8.5 (L) 07/30/2021 0848   PROT 6.5 07/26/2021 1415   ALBUMIN 2.4 (L) 07/30/2021 0848   AST 12 (L) 07/26/2021 1415   ALT 18 07/26/2021 1415   ALKPHOS 98 07/26/2021 1415   BILITOT 0.6 07/26/2021 1415   GFRNONAA 6 (L) 07/30/2021 0848   GFRAA 5 (L) 08/27/2019 1540     No results found.     Assessment & Plan:   1. ESRD (end stage renal disease) ([place]) The patient still has continued significant edema in his forearm.  The patient's PermCath is currently in his left chest.  I suspect that there is a stenosis within the central veins causing his swelling.  We will plan on moving the PermCath to the opposite chest with intervention to the central veins to see if this will alleviate the swelling and discomfort.  In the interim the patient advised to continue with compression and elevation.  We will also send in an additional prescription as the patient lost his previous antibiotics.   Current Outpatient Medications on File Prior to Visit  Medication Sig Dispense Refill   acetaminophen (TYLENOL) 325 MG tablet Take 1-2 tablets (325-650 mg total) by mouth every 4 (four) hours as needed for mild pain.     amiodarone (PACERONE) 200 MG tablet Take 1 tablet (200 mg total) by mouth every morning. 90 tablet 3   amLODipine (NORVASC) 5 MG tablet Take 5 mg by mouth daily.      atorvastatin (LIPITOR) 80 MG tablet Take 1 tablet (80 mg total) by mouth daily. (Patient taking differently: Take 80 mg by mouth at bedtime.) 30 tablet 0   calcium acetate (PHOSLO) 667 MG capsule Take 2 capsules (1,334 mg total) by mouth 3 (three) times daily with meals. 180 capsule 0   carvedilol (COREG) 25 MG tablet Take 25 mg by mouth 2 (two) times daily.     clonazePAM (KLONOPIN) 0.5 MG disintegrating tablet Take 1 tablet (0.5 mg total) by mouth daily as needed for seizure (for seizures). 10 tablet 0   cloNIDine (CATAPRES) 0.1 MG tablet Take 1 tablet (0.1 mg total) by mouth 3 (three) times daily as needed (systolic blood pressure (top number) is Greater than 200 or DBP (lower number)is Greater than 110). 30 tablet 3   clopidogrel (PLAVIX) 75 MG tablet Take 1 tablet (75 mg total) by mouth daily. 30 tablet 0   dorzolamide-timolol (COSOPT) 22.3-6.8 MG/ML ophthalmic solution Place 1 drop into the right eye 2 (two) times daily.     ELIQUIS 2.5 MG TABS tablet Take 2 tablets (5 mg total) by mouth 2 (two) times daily. 60 tablet 3   erythromycin ophthalmic ointment Place 1 Application into both eyes daily.  HYDROcodone-acetaminophen (NORCO/VICODIN) 5-325 MG tablet Take 2 tablets by mouth every 6 (six) hours as needed for moderate pain. 20 tablet 0   hydrocortisone 2.5 % cream Place rectally.     lactobacillus (FLORANEX/LACTINEX) PACK Take 1 packet (1 g total) by mouth 3 (three) times daily with meals. 90 packet 0   levETIRAcetam (KEPPRA) 250 MG tablet Take 1 tablet (250 mg total) by mouth Every Tuesday,Thursday,and Saturday with dialysis. 15 tablet 1   levETIRAcetam (KEPPRA) 500 MG tablet Take 1 tablet (500 mg total) by mouth 2 (two) times daily. 60 tablet 2   melatonin 5 MG TABS Take 1 tablet (5 mg total) by mouth at bedtime as needed. 30 tablet 0   mupirocin ointment (BACTROBAN) 2 % Apply 1 Application topically 2 (two) times daily. 22 g 0   neomycin-polymyxin b-dexamethasone (MAXITROL) 3.5-10000-0.1  OINT Place 1 application. into the left eye in the morning and at bedtime.     nitroGLYCERIN (NITROSTAT) 0.4 MG SL tablet Place 1 tablet (0.4 mg total) under the tongue every 5 (five) minutes as needed for chest pain.     phenylephrine (,USE FOR PREPARATION-H,) 0.25 % suppository Place 1 suppository rectally 2 (two) times daily as needed for hemorrhoids. 12 suppository 0   prednisoLONE acetate (PRED FORTE) 1 % ophthalmic suspension Place 1 drop into the left eye every 4 (four) hours. 5 mL 0   PREPARATION H 0.25-88.44 % suppository Place rectally.     tamsulosin (FLOMAX) 0.4 MG CAPS capsule Take 0.4 mg by mouth daily.     witch hazel-glycerin (TUCKS) pad Apply topically as needed for itching. 40 each 12   No current facility-administered medications on file prior to visit.    There are no Patient Instructions on file for this visit. No follow-ups on file.   Kris Hartmann, NP

## 2021-08-30 NOTE — H&P (View-Only) (Signed)
Subjective:    Patient ID: [name]., male    DOB: 1970/07/18, 51 y.o.   MRN: [id number] No chief complaint on file.   The patient returns today with continued swelling in his left forearm.  The patient recently underwent placement of a left brachiocephalic AV fistula.  He notes that shortly thereafter he began to have significant swelling of his lower arm.  He notes that the incision Although it has not dehisced it is painful in nature.  The swelling and of itself is painful with his hand being at its worst.  He denies any fevers or chills.  He denies any signs symptoms significant with steal syndrome.    Review of Systems  Cardiovascular:        Arm swelling  All other systems reviewed and are negative.      Objective:   Physical Exam Vitals reviewed.  HENT:     Head: Normocephalic.  Cardiovascular:     Rate and Rhythm: Normal rate.  Pulmonary:     Effort: Pulmonary effort is normal.  Skin:    General: Skin is warm and dry.  Neurological:     Mental Status: He is alert and oriented to person, place, and time.  Psychiatric:        Mood and Affect: Mood normal.        Behavior: Behavior normal.        Thought Content: Thought content normal.        Judgment: Judgment normal.     BP 124/63 (BP Location: Right Arm)   Pulse 61   Resp 17   Past Medical History:  Diagnosis Date   Anxiety    a.) on BZO (clonazepam) PRN   Aortic dilatation ([place]) 11/25/2020   a.) TTE 11/25/2020: Ao root measured 38 mm. b.) TTE 04/22/2021: Ao root measured 44 mm.   Atrial fibrillation ([place])    a.) CHA2DS2-VASc = 6 (CHF, HTN, CVA x2, prior MI, T2DM). b.) rate/rhythm maintained on oral amiodarone + carvedilol; chronically anticoagulated using dose reduced apixaban + clopidogrel   BPH (benign prostatic hyperplasia)    Cardiac arrest ([place]) 07/26/2021   a.) in setting of hyperkalemia, NSTEMI, and seizure following missed HD; pulseless and apneic --> required 1 round of CPR prior to  ROSC   Cellulitis    CHF (congestive heart failure) ([place]) 07/19/2017   a.) TTE 07/19/2017: EF 75%; sev LVH; mild MR/PR/TR; G1DD. b.) TTE 11/25/2020: EF 55-60%; mod LVH; GLS -13.0; mild-mod BAE; triv MR; mild dilated PA; G2DD. c.) TTE 04/22/2021: EF 55-60%; sev LVH; sev LAE, triv MR; G1DD.   Chronic diarrhea    Chronic pain of both ankles    Coronary artery disease 02/26/2020   a.) anterior STEMI 02/26/2020 --> LHC/PCI --> EF 65%; 90% mLAD (3.5 x 30 mm Resolyte Onyx DES x 1). b.) NSTEMI 07/26/2021   Diabetes mellitus, type II ([place])    Diabetic neuropathy ([place])    Esophagitis    ESRD (end stage renal disease) on dialysis ([place])    a.) [facility]; T-Th-Sat   Frequent falls    Gait instability    Gastritis    Gastroparesis    GERD (gastroesophageal reflux disease)    H/O enucleation of left eyeball    Heart palpitations    History of kidney stones    HLD (hyperlipidemia)    Hypertension    Insomnia    a.) uses melatonin PRN   Long term current use of anticoagulant    a.)  dose reduced apixaban   Long term current use of antithrombotics/antiplatelets    a.) clopidogrel   Nausea and vomiting in adult    recurrent   NSTEMI (non-ST elevated myocardial infarction) ([place]) 07/26/2021   a.) hyperkalemic from missed HD; seizures; decompensated to cardiac arrest and required CPR x 1 round prior to ROSC.   Seizure ([place])    a.) last 07/26/2021 in setting of missed HD --> hyperkalemic at 6.5 --> pulseless/apneic and required CPR; discharged home on levetiracetam.   ST elevation myocardial infarction (STEMI) of anterior wall ([place]) 02/26/2020   a.) LHC/PCI 02/26/2020 --> EF 65%; LVEDP 11 mmHg; 90% mLAD (3.5 x 20 mm Resolute Onyx DES x 1)   Stroke ([place]) 04/2021   a.) documented as x 3 between 04/2021 and 06/2021    Social History   Socioeconomic History   Marital status: Married    Spouse name: [name]    Number of children: 2   Years of education: Not on file   Highest education level: High  school graduate  Occupational History   Occupation: Disability    Comment: not employed  Tobacco Use   Smoking status: Never   Smokeless tobacco: Never  Vaping Use   Vaping Use: Never used  Substance and Sexual Activity   Alcohol use: No   Drug use: No   Sexual activity: Yes  Other Topics Concern   Not on file  Social History Narrative   Oldest son killed in car crash June 2020.    Social Determinants of Health   Financial Resource Strain: High Risk (07/05/2017)   Overall Financial Resource Strain (CARDIA)    Difficulty of Paying Living Expenses: Hard  Food Insecurity: Food Insecurity Present (07/05/2017)   Hunger Vital Sign    Worried About Running Out of Food in the Last Year: Often true    Ran Out of Food in the Last Year: Often true  Transportation Needs: No Transportation Needs (02/28/2020)   PRAPARE - Hydrologist (Medical): No    Lack of Transportation (Non-Medical): No  Physical Activity: Inactive (07/05/2017)   Exercise Vital Sign    Days of Exercise per Week: 0 days    Minutes of Exercise per Session: 0 min  Stress: Stress Concern Present (07/05/2017)   [place]    Feeling of Stress : Rather much  Social Connections: Moderately Integrated (07/05/2017)   Social Connection and Isolation Panel [NHANES]    Frequency of Communication with Friends and Family: Three times a week    Frequency of Social Gatherings with Friends and Family: Once a week    Attends Religious Services: More than 4 times per year    Active Member of [employer] or Organizations: No    Attends Archivist Meetings: Never    Marital Status: Married  Human resources officer Violence: Not At Risk (07/05/2017)   Humiliation, Afraid, Rape, and Kick questionnaire    Fear of Current or Ex-Partner: No    Emotionally Abused: No    Physically Abused: No    Sexually Abused: No    Past Surgical History:  Procedure  Laterality Date   AMPUTATION Left 03/16/2016   Procedure: [place];  Surgeon: Trula Slade, DPM;  Location: [facility];  Service: Podiatry;  Laterality: Left;  can start around 5    AMPUTATION Right 02/09/2021   Procedure: AMPUTATION DIGIT;  Surgeon: Sherilyn Cooter, MD;  Location: [facility];  Service:  Orthopedics;  Laterality: Right;   ARTHROSCOPIC REPAIR ACL Left    AV FISTULA PLACEMENT Right 05/31/2019   Procedure: Brachiocephalic AV fistula creation;  Surgeon: Algernon Huxley, MD;  Location: [name];  Service: Vascular;  Laterality: Right;   AV FISTULA PLACEMENT Left 08/13/2021   Procedure: ARTERIOVENOUS (AV) FISTULA CREATION (BRACHIALCEPHALIC);  Surgeon: Algernon Huxley, MD;  Location: [name];  Service: Vascular;  Laterality: Left;   COLONOSCOPY WITH PROPOFOL N/A 10/28/2015   Procedure: COLONOSCOPY WITH PROPOFOL;  Surgeon: Lollie Sails, MD;  Location: [facility] END[facility]COPY;  Service: Endoscopy;  Laterality: N/A;   COLONOSCOPY WITH PROPOFOL N/A 10/29/2015   Procedure: COLONOSCOPY WITH PROPOFOL;  Surgeon: Lollie Sails, MD;  Location: [facility] ENDOSCOPY;  Service: Endoscopy;  Laterality: N/A;   CORONARY/GRAFT ACUTE MI REVASCULARIZATION N/A 02/26/2020   Procedure: Coronary/Graft Acute MI Revascularization;  Surgeon: Jettie Booze, MD;  Location: [place] [facility];  Service: Cardiovascular;  Laterality: N/A;   DIALYSIS/PERMA CATHETER INSERTION Right 04/26/2019   Perm Cath    DIALYSIS/PERMA CATHETER INSERTION N/A 04/26/2019   Procedure: DIALYSIS/PERMA CATHETER INSERTION;  Surgeon: Algernon Huxley, MD;  Location: [place] [facility];  Service: Cardiovascular;  Laterality: N/A;   DIALYSIS/PERMA CATHETER INSERTION N/A 05/25/2021   Procedure: DIALYSIS/PERMA CATHETER INSERTION;  Surgeon: Algernon Huxley, MD;  Location: [place] [facility];  Service: Cardiovascular;  Laterality: N/A;   DIALYSIS/PERMA CATHETER REMOVAL N/A 08/15/2019   Procedure: DIALYSIS/PERMA CATHETER REMOVAL;   Surgeon: Katha Cabal, MD;  Location: [place] [facility];  Service: Cardiovascular;  Laterality: N/A;   EMBOLIZATION Right 02/06/2021   Procedure: EMBOLIZATION;  Surgeon: Algernon Huxley, MD;  Location: [place] [facility];  Service: Cardiovascular;  Laterality: Right;  Right Upper Extremity Dialysis Access, Permcath Placement   ESOPHAGOGASTRODUODENOSCOPY (EGD) WITH PROPOFOL N/A 12/27/2017   Procedure: ESOPHAGOGASTRODUODENOSCOPY (EGD) WITH PROPOFOL;  Surgeon: [place], Benay Pike, MD;  Location: [name] ENDOSCOPY;  Service: Gastroenterology;  Laterality: N/A;   EYE SURGERY     INTRAVASCULAR ULTRASOUND/IVUS N/A 02/26/2020   Procedure: Intravascular Ultrasound/IVUS;  Surgeon: Jettie Booze, MD;  Location: [place] [facility];  Service: Cardiovascular;  Laterality: N/A;   LEFT HEART CATH AND CORONARY ANGIOGRAPHY N/A 02/26/2020   Procedure: LEFT HEART CATH AND CORONARY ANGIOGRAPHY;  Surgeon: Jettie Booze, MD;  Location: [place] [facility];  Service: Cardiovascular;  Laterality: N/A;   PROSTATE SURGERY  2016   TONSILECTOMY/ADENOIDECTOMY WITH MYRINGOTOMY     TONSILLECTOMY     UPPER EXTREMITY ANGIOGRAPHY Right 11/26/2020   Procedure: Upper Extremity Angiography;  Surgeon: Algernon Huxley, MD;  Location: [place] [facility];  Service: Cardiovascular;  Laterality: Right;   UPPER EXTREMITY ANGIOGRAPHY Right 02/05/2021   Procedure: UPPER EXTREMITY ANGIOGRAPHY;  Surgeon: Algernon Huxley, MD;  Location: [place] [facility];  Service: Cardiovascular;  Laterality: Right;    Family History  Problem Relation Age of Onset   CAD Father    Stroke Father    Diabetes Mellitus II Mother    Kidney failure Mother    Schizophrenia Mother     Allergies  Allergen Reactions   Levofloxacin Swelling    Facial swelling Other reaction(s): swelling   Promethazine Diarrhea and Other (See Comments)    Muscle cramps   Promethazine Hcl Other (See Comments)    Cramping all over  Other reaction(s): cramp all  over   Other     Other reaction(s): Unknown Other reaction(s): Unknown   Malt Other (See Comments)  Cramping       Latest Ref Rng & Units 08/13/2021   11:56 AM 07/30/2021    8:48 AM 07/29/2021    5:24 AM  CBC  WBC 4.0 - 10.5 K/uL  8.4  9.9   Hemoglobin 13.0 - 17.0 g/dL 9.9  8.4  8.4   Hematocrit 39.0 - 52.0 % 29.0  28.8  27.8   Platelets 150 - 400 K/uL  132  111       CMP     Component Value Date/Time   NA 142 08/13/2021 1156   K 4.2 08/13/2021 1156   CL 110 08/13/2021 1156   CO2 25 07/30/2021 0848   GLUCOSE 81 08/13/2021 1156   BUN 55 (H) 08/13/2021 1156   CREATININE 12.30 (H) 08/13/2021 1156   CALCIUM 8.5 (L) 07/30/2021 0848   PROT 6.5 07/26/2021 1415   ALBUMIN 2.4 (L) 07/30/2021 0848   AST 12 (L) 07/26/2021 1415   ALT 18 07/26/2021 1415   ALKPHOS 98 07/26/2021 1415   BILITOT 0.6 07/26/2021 1415   GFRNONAA 6 (L) 07/30/2021 0848   GFRAA 5 (L) 08/27/2019 1540     No results found.     Assessment & Plan:   1. ESRD (end stage renal disease) ([place]) The patient still has continued significant edema in his forearm.  The patient's PermCath is currently in his left chest.  I suspect that there is a stenosis within the central veins causing his swelling.  We will plan on moving the PermCath to the opposite chest with intervention to the central veins to see if this will alleviate the swelling and discomfort.  In the interim the patient advised to continue with compression and elevation.  We will also send in an additional prescription as the patient lost his previous antibiotics.   Current Outpatient Medications on File Prior to Visit  Medication Sig Dispense Refill   acetaminophen (TYLENOL) 325 MG tablet Take 1-2 tablets (325-650 mg total) by mouth every 4 (four) hours as needed for mild pain.     amiodarone (PACERONE) 200 MG tablet Take 1 tablet (200 mg total) by mouth every morning. 90 tablet 3   amLODipine (NORVASC) 5 MG tablet Take 5 mg by mouth daily.      atorvastatin (LIPITOR) 80 MG tablet Take 1 tablet (80 mg total) by mouth daily. (Patient taking differently: Take 80 mg by mouth at bedtime.) 30 tablet 0   calcium acetate (PHOSLO) 667 MG capsule Take 2 capsules (1,334 mg total) by mouth 3 (three) times daily with meals. 180 capsule 0   carvedilol (COREG) 25 MG tablet Take 25 mg by mouth 2 (two) times daily.     clonazePAM (KLONOPIN) 0.5 MG disintegrating tablet Take 1 tablet (0.5 mg total) by mouth daily as needed for seizure (for seizures). 10 tablet 0   cloNIDine (CATAPRES) 0.1 MG tablet Take 1 tablet (0.1 mg total) by mouth 3 (three) times daily as needed (systolic blood pressure (top number) is Greater than 200 or DBP (lower number)is Greater than 110). 30 tablet 3   clopidogrel (PLAVIX) 75 MG tablet Take 1 tablet (75 mg total) by mouth daily. 30 tablet 0   dorzolamide-timolol (COSOPT) 22.3-6.8 MG/ML ophthalmic solution Place 1 drop into the right eye 2 (two) times daily.     ELIQUIS 2.5 MG TABS tablet Take 2 tablets (5 mg total) by mouth 2 (two) times daily. 60 tablet 3   erythromycin ophthalmic ointment Place 1 Application into both eyes daily.  HYDROcodone-acetaminophen (NORCO/VICODIN) 5-325 MG tablet Take 2 tablets by mouth every 6 (six) hours as needed for moderate pain. 20 tablet 0   hydrocortisone 2.5 % cream Place rectally.     lactobacillus (FLORANEX/LACTINEX) PACK Take 1 packet (1 g total) by mouth 3 (three) times daily with meals. 90 packet 0   levETIRAcetam (KEPPRA) 250 MG tablet Take 1 tablet (250 mg total) by mouth Every Tuesday,Thursday,and Saturday with dialysis. 15 tablet 1   levETIRAcetam (KEPPRA) 500 MG tablet Take 1 tablet (500 mg total) by mouth 2 (two) times daily. 60 tablet 2   melatonin 5 MG TABS Take 1 tablet (5 mg total) by mouth at bedtime as needed. 30 tablet 0   mupirocin ointment (BACTROBAN) 2 % Apply 1 Application topically 2 (two) times daily. 22 g 0   neomycin-polymyxin b-dexamethasone (MAXITROL) 3.5-10000-0.1  OINT Place 1 application. into the left eye in the morning and at bedtime.     nitroGLYCERIN (NITROSTAT) 0.4 MG SL tablet Place 1 tablet (0.4 mg total) under the tongue every 5 (five) minutes as needed for chest pain.     phenylephrine (,USE FOR PREPARATION-H,) 0.25 % suppository Place 1 suppository rectally 2 (two) times daily as needed for hemorrhoids. 12 suppository 0   prednisoLONE acetate (PRED FORTE) 1 % ophthalmic suspension Place 1 drop into the left eye every 4 (four) hours. 5 mL 0   PREPARATION H 0.25-88.44 % suppository Place rectally.     tamsulosin (FLOMAX) 0.4 MG CAPS capsule Take 0.4 mg by mouth daily.     witch hazel-glycerin (TUCKS) pad Apply topically as needed for itching. 40 each 12   No current facility-administered medications on file prior to visit.    There are no Patient Instructions on file for this visit. No follow-ups on file.   Kris Hartmann, NP

## 2021-08-31 ENCOUNTER — Ambulatory Visit
Admission: RE | Admit: 2021-08-31 | Discharge: 2021-08-31 | Disposition: A | Discharge status: Home / Self Care | Payer: Medicare HMO | Attending: Vascular Surgery | Admitting: Vascular Surgery

## 2021-08-31 ENCOUNTER — Encounter
Admission: RE | Disposition: A | Discharge status: Home / Self Care | Payer: Self-pay | Source: Home / Self Care | Attending: Vascular Surgery

## 2021-08-31 ENCOUNTER — Encounter: Payer: Self-pay | Admitting: Vascular Surgery

## 2021-08-31 DIAGNOSIS — T82858A Stenosis of vascular prosthetic devices, implants and grafts, initial encounter: Secondary | ICD-10-CM | POA: Diagnosis not present

## 2021-08-31 DIAGNOSIS — N186 End stage renal disease: Secondary | ICD-10-CM | POA: Diagnosis not present

## 2021-08-31 DIAGNOSIS — Y841 Kidney dialysis as the cause of abnormal reaction of the patient, or of later complication, without mention of misadventure at the time of the procedure: Secondary | ICD-10-CM | POA: Diagnosis not present

## 2021-08-31 DIAGNOSIS — T82898A Other specified complication of vascular prosthetic devices, implants and grafts, initial encounter: Secondary | ICD-10-CM | POA: Diagnosis not present

## 2021-08-31 DIAGNOSIS — E1122 Type 2 diabetes mellitus with diabetic chronic kidney disease: Secondary | ICD-10-CM | POA: Diagnosis not present

## 2021-08-31 DIAGNOSIS — Z992 Dependence on renal dialysis: Secondary | ICD-10-CM | POA: Insufficient documentation

## 2021-08-31 DIAGNOSIS — I509 Heart failure, unspecified: Secondary | ICD-10-CM | POA: Insufficient documentation

## 2021-08-31 DIAGNOSIS — I132 Hypertensive heart and chronic kidney disease with heart failure and with stage 5 chronic kidney disease, or end stage renal disease: Secondary | ICD-10-CM | POA: Insufficient documentation

## 2021-08-31 DIAGNOSIS — M7989 Other specified soft tissue disorders: Secondary | ICD-10-CM | POA: Diagnosis not present

## 2021-08-31 HISTORY — PX: DIALYSIS/PERMA CATHETER INSERTION: CATH118288

## 2021-08-31 HISTORY — PX: A/V FISTULAGRAM: CATH118298

## 2021-08-31 LAB — POTASSIUM (ARMC VASCULAR LAB ONLY): Potassium (ARMC vascular lab): 3.7 mmol/L (ref 3.5–5.1)

## 2021-08-31 SURGERY — A/V FISTULAGRAM
Anesthesia: Moderate Sedation

## 2021-08-31 MED ORDER — HEPARIN SODIUM (PORCINE) 1000 UNIT/ML IJ SOLN
INTRAMUSCULAR | Status: DC | PRN
  Administered 2021-08-31: 3000 [IU] via INTRAVENOUS

## 2021-08-31 MED ORDER — DIPHENHYDRAMINE HCL 50 MG/ML IJ SOLN
INTRAMUSCULAR | Status: DC | PRN
  Administered 2021-08-31: 50 mg via INTRAVENOUS

## 2021-08-31 MED ORDER — FENTANYL CITRATE (PF) 100 MCG/2ML IJ SOLN
INTRAMUSCULAR | Status: DC | PRN
  Administered 2021-08-31 (×4): 25 ug via INTRAVENOUS

## 2021-08-31 MED ORDER — MIDAZOLAM HCL 2 MG/2ML IJ SOLN
INTRAMUSCULAR | Status: DC | PRN
  Administered 2021-08-31: 1 mg via INTRAVENOUS
  Administered 2021-08-31 (×2): .5 mg via INTRAVENOUS

## 2021-08-31 MED ORDER — METHYLPREDNISOLONE SODIUM SUCC 125 MG IJ SOLR: 125.0000 mg | Freq: Once | INTRAMUSCULAR | Status: DC | PRN

## 2021-08-31 MED ORDER — DIPHENHYDRAMINE HCL 50 MG/ML IJ SOLN
50.0000 mg | Freq: Once | INTRAMUSCULAR | Status: DC | PRN
Start: 2021-08-31 — End: 2021-08-31

## 2021-08-31 MED ORDER — CEFAZOLIN SODIUM-DEXTROSE 1-4 GM/50ML-% IV SOLN: 1.0000 g | INTRAVENOUS | Status: AC

## 2021-08-31 MED ORDER — DIPHENHYDRAMINE HCL 50 MG/ML IJ SOLN
INTRAMUSCULAR | Status: AC
  Filled 2021-08-31: qty 1

## 2021-08-31 MED ORDER — CEFAZOLIN SODIUM-DEXTROSE 1-4 GM/50ML-% IV SOLN
INTRAVENOUS | Status: AC
  Administered 2021-08-31: 1 g via INTRAVENOUS
  Filled 2021-08-31: qty 50

## 2021-08-31 MED ORDER — IODIXANOL 320 MG/ML IV SOLN
INTRAVENOUS | Status: DC | PRN
  Administered 2021-08-31: 40 mL via INTRAVENOUS

## 2021-08-31 MED ORDER — MIDAZOLAM HCL 2 MG/2ML IJ SOLN
INTRAMUSCULAR | Status: AC
  Filled 2021-08-31: qty 2

## 2021-08-31 MED ORDER — MIDAZOLAM HCL 2 MG/ML PO SYRP
ORAL_SOLUTION | ORAL | Status: AC
  Administered 2021-08-31: 8 mg via ORAL
  Filled 2021-08-31: qty 4

## 2021-08-31 MED ORDER — MIDAZOLAM HCL 2 MG/ML PO SYRP: 8.0000 mg | ORAL_SOLUTION | Freq: Once | ORAL | Status: AC | PRN

## 2021-08-31 MED ORDER — FENTANYL CITRATE (PF) 100 MCG/2ML IJ SOLN
INTRAMUSCULAR | Status: AC
  Filled 2021-08-31: qty 2

## 2021-08-31 MED ORDER — FAMOTIDINE 20 MG PO TABS: 40.0000 mg | ORAL_TABLET | Freq: Once | ORAL | Status: DC | PRN

## 2021-08-31 MED ORDER — HEPARIN SODIUM (PORCINE) 1000 UNIT/ML IJ SOLN
INTRAMUSCULAR | Status: AC
  Filled 2021-08-31: qty 10

## 2021-08-31 MED ORDER — SODIUM CHLORIDE 0.9 % IV SOLN
INTRAVENOUS | Status: DC
  Administered 2021-08-31: 1000 mL via INTRAVENOUS

## 2021-08-31 MED ORDER — HYDROMORPHONE HCL 1 MG/ML IJ SOLN: 1.0000 mg | Freq: Once | INTRAMUSCULAR | Status: DC | PRN

## 2021-08-31 SURGICAL SUPPLY — 17 items
ADH SKN CLS APL DERMABOND .7 (GAUZE/BANDAGES/DRESSINGS) ×2
BALLN ARMADA 12X60X80 (BALLOONS) ×3
BALLOON ARMADA 12X60X80 (BALLOONS) IMPLANT
BIOPATCH RED 1 DISK 7.0 (GAUZE/BANDAGES/DRESSINGS) ×1 IMPLANT
CANNULA 5F STIFF (CANNULA) ×1 IMPLANT
CATH BEACON 5 .035 65 KMP TIP (CATHETERS) ×1 IMPLANT
CATH CANNON HEMO 15FR 19 (HEMODIALYSIS SUPPLIES) ×1 IMPLANT
COVER PROBE U/S 5X48 (MISCELLANEOUS) ×2 IMPLANT
DERMABOND ADVANCED (GAUZE/BANDAGES/DRESSINGS) ×1
DERMABOND ADVANCED .7 DNX12 (GAUZE/BANDAGES/DRESSINGS) IMPLANT
DRAPE BRACHIAL (DRAPES) ×1 IMPLANT
GLIDEWIRE ADV .035X180CM (WIRE) ×1 IMPLANT
KIT ENCORE 26 ADVANTAGE (KITS) ×1 IMPLANT
PACK ANGIOGRAPHY (CUSTOM PROCEDURE TRAY) ×3 IMPLANT
SHEATH BRITE TIP 6FRX5.5 (SHEATH) ×1 IMPLANT
SUT MNCRL AB 4-0 PS2 18 (SUTURE) ×2 IMPLANT
SUT PROLENE 0 CT 1 30 (SUTURE) ×1 IMPLANT

## 2021-08-31 NOTE — Op Note (Signed)
[facility]    OPERATIVE NOTE   PROCEDURE: 1.   Left brachiocephalic arteriovenous fistula cannulation under ultrasound guidance 2.   Left arm fistulagram including central venogram 3.   Percutaneous transluminal angioplasty of left innominate vein with a 12 mm diameter angioplasty balloon 4.   Ultrasound guidance for vascular access right jugular vein 5.  Fluoroscopic guidance for placement of catheter 6.  Placement of 19 cm tip to cuff tunneled hemodialysis catheter right jugular vein 7.  Removal of left jugular dialysis catheter  PRE-OPERATIVE DIAGNOSIS: 1. ESRD 2. 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 is a 51 y.o. male who presents with malfunctioning left arm arteriovenous fistula.  He has marked left arm swelling with his PermCath on that side, and there is significant concern for central venous stenosis around the catheter.  The patient is scheduled for possible removal of this catheter and replacement on the other side and left arm fistulagram.  The patient is aware the risks include but are not limited to: bleeding, infection, thrombosis of the cannulated access, and possible anaphylactic reaction to the contrast.  The patient is aware of the risks of the procedure and elects to proceed forward.  DESCRIPTION: After full informed written consent was obtained, the patient was  brought back to the angiography suite and placed supine upon the angiography table.  The patient was connected to monitoring equipment. Moderate conscious sedation was administered with a face to face encounter with the patient throughout the procedure with my supervision of the RN administering medicines and monitoring the patient's vital signs and mental status throughout from the start of the procedure until the patient was taken to the recovery room. The left arm was prepped and draped in the standard fashion for a percutaneous access intervention.  Under ultrasound guidance, the left brachiocephalic arteriovenous fistula was cannulated with a micropuncture needle under direct ultrasound guidance where it was patent and a permanent image was performed.  The microwire was advanced into the fistula and the needle was exchanged for the a microsheath.  I then upsized to a 6 Fr Sheath and imaging was performed.  Hand injections were completed to image the access including the central venous system. 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  Based on the images, this patient will need intervention for the central venous stenosis and possibly removal of this catheter and placement of a new catheter on the other side. I then gave the patient 3000 units of intravenous heparin.  I then crossed the stenosis with an Advantage wire.  Based on the imaging, a 12 mm x 6 cm  angioplasty balloon was selected.  The balloon was centered around the left innominate vein stenosis and inflated to 8 ATM for 1 minute(s).  On  completion imaging, a greater than 50% residual stenosis was present with the catheter in place.   At this point, it was clear that this catheter would need to be removed.  We turned our attention to the right jugular which  was patent although somewhat small in the neck.  There was then accessed under direct ultrasound guidance without difficulty with a micropuncture needle and a permanent image was recorded.  A micropuncture wire and sheath were then placed.  A J-wire was placed after skin nick and dilatation a peel-away sheath was placed over the wire.  An incision was made in the subclavicular area under the right clavicle.  I then tunneled from the subclavicular incision to the access site and using fluoroscopic guidance selected a 19 cm tip to cuff tunneled hemodialysis catheter and brought it from the subclavicular incision at the access site.  It was then placed through the peel-away sheath and the peel-away sheath was removed with the catheter tip parked in the right atrium.  The appropriate connectors were placed and it withdrew well and flushed easily with heparinized saline and a concentrated heparin solution was placed.  It was then secured to the chest wall 2 Prolene sutures.  4-0 Monocryl was used as a pursestring suture around the exit site as well as to close the jugular incision.  Sterile dressing was placed and then turned our attention to removal of the left jugular PermCath. This area and already been prepped and draped in the original prepping.  The area was anesthetized copiously with 1% lidocaine and hemostats were used to help free the cuff from the fibrous connective tissue.  The catheter was then removed and's entirety without difficulty and pressure was held for several minutes. After the catheter was removed, we left the wire in place and performed angioplasty again in the left innominate vein.  The same 12 mm diameter by 6 cm length angioplasty balloon was inflated to 6 atm for 1 minute.  Now the catheters been removed, there was a much better result with only about a 20% residual stenosis.   Based on the completion imaging, no further intervention is necessary.  The wire and balloon were removed from  the sheath.  A 4-0 Monocryl purse-string suture was sewn around the sheath.  The sheath was removed while tying down the suture.  A sterile bandage was applied to the puncture site.  COMPLICATIONS: None  CONDITION: Stable   [name]  08/31/2021 2:11 PM   This note was created with Dragon Medical transcription system. Any errors in dictation are purely unintentional.

## 2021-08-31 NOTE — Interval H&P Note (Signed)
History and Physical Interval Note:  08/31/2021 11:32 AM  [name].  has presented today for surgery, with the diagnosis of L arm fistulagram and Perm Cath Exchange     End Stage Renal.  The various methods of treatment have been discussed with the patient and family. After consideration of risks, benefits and other options for treatment, the patient has consented to  Procedure(s): A/V Fistulagram (Left) DIALYSIS/PERMA CATHETER INSERTION (N/A) as a surgical intervention.  The patient's history has been reviewed, patient examined, no change in status, stable for surgery.  I have reviewed the patient's chart and labs.  Questions were answered to the patient's satisfaction.     [name]

## 2021-09-01 ENCOUNTER — Encounter: Payer: Self-pay | Admitting: Vascular Surgery

## 2021-09-02 ENCOUNTER — Ambulatory Visit (HOSPITAL_COMMUNITY): Payer: Medicare HMO | Attending: Emergency Medicine

## 2021-09-04 ENCOUNTER — Encounter (HOSPITAL_COMMUNITY): Payer: Self-pay

## 2021-09-04 ENCOUNTER — Other Ambulatory Visit: Payer: Self-pay

## 2021-09-04 ENCOUNTER — Emergency Department (HOSPITAL_COMMUNITY)
Admission: EM | Admit: 2021-09-04 | Discharge: 2021-09-04 | Disposition: A | Discharge status: Home / Self Care | Payer: Medicare HMO | Attending: Emergency Medicine | Admitting: Emergency Medicine

## 2021-09-04 DIAGNOSIS — E1140 Type 2 diabetes mellitus with diabetic neuropathy, unspecified: Secondary | ICD-10-CM | POA: Diagnosis not present

## 2021-09-04 DIAGNOSIS — I132 Hypertensive heart and chronic kidney disease with heart failure and with stage 5 chronic kidney disease, or end stage renal disease: Secondary | ICD-10-CM | POA: Insufficient documentation

## 2021-09-04 DIAGNOSIS — Z7902 Long term (current) use of antithrombotics/antiplatelets: Secondary | ICD-10-CM | POA: Diagnosis not present

## 2021-09-04 DIAGNOSIS — E876 Hypokalemia: Secondary | ICD-10-CM | POA: Diagnosis not present

## 2021-09-04 DIAGNOSIS — I2510 Atherosclerotic heart disease of native coronary artery without angina pectoris: Secondary | ICD-10-CM | POA: Diagnosis not present

## 2021-09-04 DIAGNOSIS — E1122 Type 2 diabetes mellitus with diabetic chronic kidney disease: Secondary | ICD-10-CM | POA: Diagnosis not present

## 2021-09-04 DIAGNOSIS — Z992 Dependence on renal dialysis: Secondary | ICD-10-CM | POA: Diagnosis not present

## 2021-09-04 DIAGNOSIS — I509 Heart failure, unspecified: Secondary | ICD-10-CM | POA: Insufficient documentation

## 2021-09-04 DIAGNOSIS — D649 Anemia, unspecified: Secondary | ICD-10-CM | POA: Diagnosis not present

## 2021-09-04 DIAGNOSIS — R7989 Other specified abnormal findings of blood chemistry: Secondary | ICD-10-CM | POA: Diagnosis not present

## 2021-09-04 DIAGNOSIS — Z7901 Long term (current) use of anticoagulants: Secondary | ICD-10-CM | POA: Diagnosis not present

## 2021-09-04 DIAGNOSIS — R799 Abnormal finding of blood chemistry, unspecified: Secondary | ICD-10-CM | POA: Diagnosis present

## 2021-09-04 DIAGNOSIS — N186 End stage renal disease: Secondary | ICD-10-CM | POA: Insufficient documentation

## 2021-09-04 DIAGNOSIS — Z79899 Other long term (current) drug therapy: Secondary | ICD-10-CM | POA: Insufficient documentation

## 2021-09-04 LAB — COMPREHENSIVE METABOLIC PANEL
ALT: <5 U/L (ref 0–44)
AST: 9 U/L — ABNORMAL LOW (ref 15–41)
Albumin: 2.8 g/dL — ABNORMAL LOW (ref 3.5–5.0)
Alkaline Phosphatase: 77 U/L (ref 38–126)
Anion gap: 13 (ref 5–15)
BUN: 51 mg/dL — ABNORMAL HIGH (ref 6–20)
CO2: 21 mmol/L — ABNORMAL LOW (ref 22–32)
Calcium: 8.3 mg/dL — ABNORMAL LOW (ref 8.9–10.3)
Chloride: 108 mmol/L (ref 98–111)
Creatinine, Ser: 10.67 mg/dL — ABNORMAL HIGH (ref 0.61–1.24)
GFR, Estimated: 5 mL/min — ABNORMAL LOW (ref >60)
Glucose, Bld: 133 mg/dL — ABNORMAL HIGH (ref 70–99)
Potassium: 3.3 mmol/L — ABNORMAL LOW (ref 3.5–5.1)
Sodium: 142 mmol/L (ref 135–145)
Total Bilirubin: 0.3 mg/dL (ref 0.3–1.2)
Total Protein: 6.5 g/dL (ref 6.5–8.1)

## 2021-09-04 LAB — CBC WITH DIFFERENTIAL/PLATELET
Abs Immature Granulocytes: 0.03 10*3/uL (ref 0.00–0.07)
Basophils Absolute: 0.1 10*3/uL (ref 0.0–0.1)
Basophils Relative: 1 %
Eosinophils Absolute: 0.3 10*3/uL (ref 0.0–0.5)
Eosinophils Relative: 5 %
HCT: 24.5 % — ABNORMAL LOW (ref 39.0–52.0)
Hemoglobin: 7.3 g/dL — ABNORMAL LOW (ref 13.0–17.0)
Immature Granulocytes: 0 %
Lymphocytes Relative: 15 %
Lymphs Abs: 1 10*3/uL (ref 0.7–4.0)
MCH: 21.5 pg — ABNORMAL LOW (ref 26.0–34.0)
MCHC: 29.8 g/dL — ABNORMAL LOW (ref 30.0–36.0)
MCV: 72.1 fL — ABNORMAL LOW (ref 80.0–100.0)
Monocytes Absolute: 0.9 10*3/uL (ref 0.1–1.0)
Monocytes Relative: 13 %
Neutro Abs: 4.4 10*3/uL (ref 1.7–7.7)
Neutrophils Relative %: 66 %
Platelets: 188 10*3/uL (ref 150–400)
RBC: 3.4 MIL/uL — ABNORMAL LOW (ref 4.22–5.81)
RDW: 23.5 % — ABNORMAL HIGH (ref 11.5–15.5)
WBC: 6.7 10*3/uL (ref 4.0–10.5)
nRBC: 0 % (ref 0.0–0.2)

## 2021-09-04 LAB — TYPE AND SCREEN
ABO/RH(D): A POS
Antibody Screen: NEGATIVE

## 2021-09-04 NOTE — ED Provider Notes (Signed)
[name] Provider Note   CSN: [id number] Arrival date & time: 09/04/21  1313     History  Chief Complaint  Patient presents with   Abnormal Lab    [name]. is a 51 y.o. male.  [name]. is a 51 y.o. male with a history of ESRD on HD (T, Th, S), hypertension, hyperlipidemia, diabetes, diabetic neuropathy, CHF, STEMI, CAD, A-fib, who presents to the emergency department for evaluation of low hemoglobin.  Patient had labs done at his PCP office and was called and told that he had a hemoglobin of 6.9, sent to the ED due to potential need for transfusion.  Patient denies any known bleeding symptoms, no blood in his stool or dark black stools.  Patient does report that he has required blood transfusions in the past due to chronic anemia from CKD.  Patient reports that he has not missed any recent dialysis sessions and is due for dialysis tomorrow.  Denies any chest pain, shortness of breath, lightheadedness or syncope.  The history is provided by the patient, the spouse and medical records.  Abnormal Lab      Home Medications Prior to Admission medications   Medication Sig Start Date End Date Taking? Authorizing Provider  acetaminophen (TYLENOL) 325 MG tablet Take 1-2 tablets (325-650 mg total) by mouth every 4 (four) hours as needed for mild pain. 05/04/21   Love, Ivan Anchors, PA-C  amiodarone (PACERONE) 200 MG tablet Take 1 tablet (200 mg total) by mouth every morning. 08/10/21 08/11/22  Swinyer, Lanice Schwab, NP  amLODipine (NORVASC) 5 MG tablet Take 5 mg by mouth daily. 08/03/21   [provider]  atorvastatin (LIPITOR) 80 MG tablet Take 1 tablet (80 mg total) by mouth daily. Patient taking differently: Take 80 mg by mouth at bedtime. 05/04/21 04/29/22  Love, Ivan Anchors, PA-C  calcium acetate (PHOSLO) 667 MG capsule Take 2 capsules (1,334 mg total) by mouth 3 (three) times daily with meals. 05/04/21   Love, Ivan Anchors, PA-C  carvedilol (COREG) 25  MG tablet Take 25 mg by mouth 2 (two) times daily. 06/16/21   [provider]  clonazePAM (KLONOPIN) 0.5 MG disintegrating tablet Take 1 tablet (0.5 mg total) by mouth daily as needed for seizure (for seizures). 07/30/21 08/29/21  Nita Sells, MD  cloNIDine (CATAPRES) 0.1 MG tablet Take 1 tablet (0.1 mg total) by mouth 3 (three) times daily as needed (systolic blood pressure (top number) is Greater than 200 or DBP (lower number)is Greater than 110). 08/10/21 08/11/22  Swinyer, Lanice Schwab, NP  clopidogrel (PLAVIX) 75 MG tablet Take 1 tablet (75 mg total) by mouth daily. 05/04/21   Love, Ivan Anchors, PA-C  dorzolamide-timolol (COSOPT) 22.3-6.8 MG/ML ophthalmic solution Place 1 drop into the right eye 2 (two) times daily.    [provider]  doxycycline (VIBRAMYCIN) 100 MG capsule Take 1 capsule (100 mg total) by mouth 2 (two) times daily. 08/26/21   Kris Hartmann, NP  ELIQUIS 2.5 MG TABS tablet Take 2 tablets (5 mg total) by mouth 2 (two) times daily. 07/30/21   Nita Sells, MD  erythromycin ophthalmic ointment Place 1 Application into both eyes daily. 06/19/21   [provider]  HYDROcodone-acetaminophen (NORCO/VICODIN) 5-325 MG tablet Take 2 tablets by mouth every 6 (six) hours as needed for moderate pain. 08/13/21 08/13/22  Algernon Huxley, MD  hydrocortisone 2.5 % cream Place rectally. 06/04/21   [provider]  lactobacillus (FLORANEX/LACTINEX) PACK Take 1 packet (1  g total) by mouth 3 (three) times daily with meals. 05/04/21   Love, Ivan Anchors, PA-C  levETIRAcetam (KEPPRA) 250 MG tablet Take 1 tablet (250 mg total) by mouth Every Tuesday,Thursday,and Saturday with dialysis. 07/30/21   Nita Sells, MD  levETIRAcetam (KEPPRA) 500 MG tablet Take 1 tablet (500 mg total) by mouth 2 (two) times daily. 07/30/21   Nita Sells, MD  melatonin 5 MG TABS Take 1 tablet (5 mg total) by mouth at bedtime as needed. 05/04/21   Love, Ivan Anchors, PA-C  mupirocin ointment  (BACTROBAN) 2 % Apply 1 Application topically 2 (two) times daily. 08/20/21   Kris Hartmann, NP  neomycin-polymyxin b-dexamethasone (MAXITROL) 3.5-10000-0.1 OINT Place 1 application. into the left eye in the morning and at bedtime. 06/19/21   [provider]  nitroGLYCERIN (NITROSTAT) 0.4 MG SL tablet Place 1 tablet (0.4 mg total) under the tongue every 5 (five) minutes as needed for chest pain. 12/09/20 12/09/21  Johnson, Clanford L, MD  phenylephrine (,USE FOR PREPARATION-H,) 0.25 % suppository Place 1 suppository rectally 2 (two) times daily as needed for hemorrhoids. 05/04/21   Love, Ivan Anchors, PA-C  prednisoLONE acetate (PRED FORTE) 1 % ophthalmic suspension Place 1 drop into the left eye every 4 (four) hours. 05/04/21   Love, Ivan Anchors, PA-C  PREPARATION H 0.25-88.44 % suppository Place rectally. 05/04/21   [provider]  tamsulosin (FLOMAX) 0.4 MG CAPS capsule Take 0.4 mg by mouth daily. 05/19/21   [provider]  witch hazel-glycerin (TUCKS) pad Apply topically as needed for itching. 05/04/21   Love, Ivan Anchors, PA-C      Allergies    Promethazine, Promethazine hcl, Other, and Malt    Review of Systems   Review of Systems  Constitutional:  Negative for chills and fever.  Respiratory:  Negative for shortness of breath.   Cardiovascular:  Negative for chest pain.  Gastrointestinal:  Negative for blood in stool.  Neurological:  Negative for syncope and light-headedness.    Physical Exam Updated Vital Signs BP (!) 105/53   Pulse 65   Temp 98 F (36.7 C)   Resp 18   Ht 6\' 3"  (1.905 m)   Wt 83.9 kg   SpO2 100%   BMI 23.12 kg/m  Physical Exam Vitals and nursing note reviewed.  Constitutional:      General: He is not in acute distress.    Appearance: Normal appearance. He is well-developed. He is not diaphoretic.  HENT:     Head: Normocephalic and atraumatic.  Eyes:     General:        Right eye: No discharge.        Left eye: No discharge.      Pupils: Pupils are equal, round, and reactive to light.  Cardiovascular:     Rate and Rhythm: Normal rate and regular rhythm.     Pulses: Normal pulses.     Heart sounds: Normal heart sounds.  Pulmonary:     Effort: Pulmonary effort is normal. No respiratory distress.     Breath sounds: Normal breath sounds. No wheezing or rales.     Comments: Respirations equal and unlabored, patient able to speak in full sentences, lungs clear to auscultation bilaterally  Abdominal:     General: Bowel sounds are normal. There is no distension.     Palpations: Abdomen is soft. There is no mass.     Tenderness: There is no abdominal tenderness. There is no guarding.     Comments: Abdomen  soft, nondistended, nontender to palpation in all quadrants without guarding or peritoneal signs  Musculoskeletal:        General: No deformity.     Cervical back: Neck supple.  Skin:    General: Skin is warm and dry.     Capillary Refill: Capillary refill takes less than 2 seconds.  Neurological:     Mental Status: He is alert and oriented to person, place, and time.     Coordination: Coordination normal.     Comments: Speech is clear, able to follow commands CN III-XII intact Normal strength in upper and lower extremities bilaterally including dorsiflexion and plantar flexion, strong and equal grip strength Sensation normal to light and sharp touch Moves extremities without ataxia, coordination intact  Psychiatric:        Mood and Affect: Mood normal.        Behavior: Behavior normal.     ED Results / Procedures / Treatments   Labs (all labs ordered are listed, but only abnormal results are displayed) Labs Reviewed  COMPREHENSIVE METABOLIC PANEL - Abnormal; Notable for the following components:      Result Value   Potassium 3.3 (*)    CO2 21 (*)    Glucose, Bld 133 (*)    BUN 51 (*)    Creatinine, Ser 10.67 (*)    Calcium 8.3 (*)    Albumin 2.8 (*)    AST 9 (*)    GFR, Estimated 5 (*)    All other  components within normal limits  CBC WITH DIFFERENTIAL/PLATELET - Abnormal; Notable for the following components:   RBC 3.40 (*)    Hemoglobin 7.3 (*)    HCT 24.5 (*)    MCV 72.1 (*)    MCH 21.5 (*)    MCHC 29.8 (*)    RDW 23.5 (*)    All other components within normal limits  TYPE AND SCREEN    EKG None  Radiology No results found.  Procedures Procedures    Medications Ordered in ED Medications - No data to display  ED Course/ Medical Decision Making/ A&P                           Medical Decision Making Amount and/or Complexity of Data Reviewed Labs: ordered.   51 y.o. male presents to the ED with complaints of low hemoglobin, this involves an extensive number of treatment options, and is a complaint that carries with it a high risk of complications and morbidity.  The differential diagnosis includes chronic anemia due to ESRD, blood loss anemia, iron deficiency  On arrival pt is nontoxic, vitals stable.  Additional history obtained from spouse. Previous records obtained and reviewed including labs from PCP   Lab Tests:  I Ordered, reviewed, and interpreted labs, which included: here CBC is imporved at 7.3, above recommended Hgb for transfusion. Mildly hypokalemic, with elevated BUN and Cr as expected. No other sigificant derangements   ED Course:   I discussed improve hemoglobin on lab work here today.  Patient is not having any bleeding symptoms and I suspect this anemia is in the setting of his CKD.  I requested consultation with nephrology and discussed pertinent labs and plan with nephrologist, they do not recommend transfusion at this time, but recommend contacting dialysis coordinator or clinic to ensure that this is rechecked in patient's treatments were adjusted as needed.  Discussed case with renal coordinator, but she is unable to reach out to  patient's dialysis clinic.  I attempted to contact patient's dialysis clinic multiple times but was unable to  get a hold of anyone so provided documentation for patient to take to dialysis tomorrow.  Patient discharged home in good condition.    Portions of this note were generated with [employer]. Dictation errors may occur despite best attempts at proofreading.         Final Clinical Impression(s) / ED Diagnoses Final diagnoses:  Anemia, unspecified type    Rx / DC Orders ED Discharge Orders     None         Janet Berlin 09/19/21 2207    Wyvonnia Dusky, MD 09/20/21 [phone]

## 2021-09-04 NOTE — Discharge Instructions (Signed)
Follow-up for dialysis tomorrow as planned.  The nephrologist did not recommend blood transfusion here today as her hemoglobin is improved on these labs to 7.3.  I have called your dialysis clinic to notify them of your hemoglobin today.

## 2021-09-04 NOTE — ED Triage Notes (Signed)
Pt sent over by PCP for low Hemoglobin of 6.9. labs faxed over and on his person

## 2021-09-06 ENCOUNTER — Encounter (INDEPENDENT_AMBULATORY_CARE_PROVIDER_SITE_OTHER): Payer: Self-pay | Admitting: Nurse Practitioner

## 2021-09-06 NOTE — Progress Notes (Signed)
Subjective:    Patient ID: [name]., male    DOB: 27-Jun-1970, 51 y.o.   MRN: [id number] Chief Complaint  Patient presents with   Follow-up    Left arm swelling and pain     The patient presents today following placement of a left brachiocephalic AV fistula on 9/37/1696.  He notes that he has had significant swelling of his forearm and his hand and hand itself.  The area where the fistula is is not swollen.  He notes that the swelling is because pulling along the incision and it is painful for him.  The wound has not dehisced.    Review of Systems  All other systems reviewed and are negative.      Objective:   Physical Exam Vitals reviewed.  HENT:     Head: Normocephalic.  Cardiovascular:     Rate and Rhythm: Normal rate.     Pulses: Normal pulses.  Pulmonary:     Effort: Pulmonary effort is normal.  Musculoskeletal:     Comments: Left upper extremity swelling  Skin:    General: Skin is warm and dry.  Neurological:     Mental Status: He is alert and oriented to person, place, and time.  Psychiatric:        Mood and Affect: Mood normal.        Behavior: Behavior normal.        Thought Content: Thought content normal.        Judgment: Judgment normal.     BP (!) 170/62 (BP Location: Right Arm)   Pulse 69   Resp 16   Past Medical History:  Diagnosis Date   Anxiety    a.) on BZO (clonazepam) PRN   Aortic dilatation ([place]) 11/25/2020   a.) TTE 11/25/2020: Ao root measured 38 mm. b.) TTE 04/22/2021: Ao root measured 44 mm.   Atrial fibrillation ([place])    a.) CHA2DS2-VASc = 6 (CHF, HTN, CVA x2, prior MI, T2DM). b.) rate/rhythm maintained on oral amiodarone + carvedilol; chronically anticoagulated using dose reduced apixaban + clopidogrel   BPH (benign prostatic hyperplasia)    Cardiac arrest ([place]) 07/26/2021   a.) in setting of hyperkalemia, NSTEMI, and seizure following missed HD; pulseless and apneic --> required 1 round of CPR prior to ROSC   Cellulitis     CHF (congestive heart failure) ([place]) 07/19/2017   a.) TTE 07/19/2017: EF 75%; sev LVH; mild MR/PR/TR; G1DD. b.) TTE 11/25/2020: EF 55-60%; mod LVH; GLS -13.0; mild-mod BAE; triv MR; mild dilated PA; G2DD. c.) TTE 04/22/2021: EF 55-60%; sev LVH; sev LAE, triv MR; G1DD.   Chronic diarrhea    Chronic pain of both ankles    Coronary artery disease 02/26/2020   a.) anterior STEMI 02/26/2020 --> LHC/PCI --> EF 65%; 90% mLAD (3.5 x 30 mm Resolyte Onyx DES x 1). b.) NSTEMI 07/26/2021   Diabetes mellitus, type II ([place])    Diabetic neuropathy ([place])    Esophagitis    ESRD (end stage renal disease) on dialysis ([place])    a.) [facility]; [facility]-Sat   Frequent falls    Gait instability    Gastritis    Gastroparesis    GERD (gastroesophageal reflux disease)    H/O enucleation of left eyeball    Heart palpitations    History of kidney stones    HLD (hyperlipidemia)    Hypertension    Insomnia    a.) uses melatonin PRN   Long term current use of anticoagulant  a.) dose reduced apixaban   Long term current use of antithrombotics/antiplatelets    a.) clopidogrel   Nausea and vomiting in adult    recurrent   NSTEMI (non-ST elevated myocardial infarction) ([place]) 07/26/2021   a.) hyperkalemic from missed HD; seizures; decompensated to cardiac arrest and required CPR x 1 round prior to ROSC.   Seizure ([place])    a.) last 07/26/2021 in setting of missed HD --> hyperkalemic at 6.5 --> pulseless/apneic and required CPR; discharged home on levetiracetam.   ST elevation myocardial infarction (STEMI) of anterior wall ([place]) 02/26/2020   a.) LHC/PCI 02/26/2020 --> EF 65%; LVEDP 11 mmHg; 90% mLAD (3.5 x 20 mm Resolute Onyx DES x 1)   Stroke ([place]) 04/2021   a.) documented as x 3 between 04/2021 and 06/2021    Social History   Socioeconomic History   Marital status: Married    Spouse name: [name]    Number of children: 2   Years of education: Not on file   Highest education level: High school graduate   Occupational History   Occupation: Disability    Comment: not employed  Tobacco Use   Smoking status: Never   Smokeless tobacco: Never  Vaping Use   Vaping Use: Never used  Substance and Sexual Activity   Alcohol use: No   Drug use: No   Sexual activity: Yes  Other Topics Concern   Not on file  Social History Narrative   Oldest son killed in car crash June 2020.    Social Determinants of Health   Financial Resource Strain: High Risk (07/05/2017)   Overall Financial Resource Strain (CARDIA)    Difficulty of Paying Living Expenses: Hard  Food Insecurity: Food Insecurity Present (07/05/2017)   Hunger Vital Sign    Worried About Running Out of Food in the Last Year: Often true    Ran Out of Food in the Last Year: Often true  Transportation Needs: No Transportation Needs (02/28/2020)   PRAPARE - Hydrologist (Medical): No    Lack of Transportation (Non-Medical): No  Physical Activity: Inactive (07/05/2017)   Exercise Vital Sign    Days of Exercise per Week: 0 days    Minutes of Exercise per Session: 0 min  Stress: Stress Concern Present (07/05/2017)   [place]    Feeling of Stress : Rather much  Social Connections: Moderately Integrated (07/05/2017)   Social Connection and Isolation Panel [NHANES]    Frequency of Communication with Friends and Family: Three times a week    Frequency of Social Gatherings with Friends and Family: Once a week    Attends Religious Services: More than 4 times per year    Active Member of [employer] or Organizations: No    Attends Archivist Meetings: Never    Marital Status: Married  Human resources officer Violence: Not At Risk (07/05/2017)   Humiliation, Afraid, Rape, and Kick questionnaire    Fear of Current or Ex-Partner: No    Emotionally Abused: No    Physically Abused: No    Sexually Abused: No    Past Surgical History:  Procedure Laterality Date    A/V FISTULAGRAM Left 08/31/2021   Procedure: A/V Fistulagram;  Surgeon: Algernon Huxley, MD;  Location: [facility];  Service: Cardiovascular;  Laterality: Left;   AMPUTATION Left 03/16/2016   Procedure: AMPUTATION DIGIT LEFT HALLUX;  Surgeon: Trula Slade, DPM;  Location: [place] [facility];  Service: Podiatry;  Laterality: Left;  can start around 5    AMPUTATION Right 02/09/2021   Procedure: AMPUTATION DIGIT;  Surgeon: Sherilyn Cooter, MD;  Location: [place];  Service: Orthopedics;  Laterality: Right;   ARTHROSCOPIC REPAIR ACL Left    AV FISTULA PLACEMENT Right 05/31/2019   Procedure: Brachiocephalic AV fistula creation;  Surgeon: Algernon Huxley, MD;  Location: [name];  Service: Vascular;  Laterality: Right;   AV FISTULA PLACEMENT Left 08/13/2021   Procedure: ARTERIOVENOUS (AV) FISTULA CREATION (BRACHIALCEPHALIC);  Surgeon: Algernon Huxley, MD;  Location: [name];  Service: Vascular;  Laterality: Left;   COLONOSCOPY WITH PROPOFOL N/A 10/28/2015   Procedure: COLONOSCOPY WITH PROPOFOL;  Surgeon: Lollie Sails, MD;  Location[facility];  Service: Endoscopy;  Laterality: N/A;   COLONOSCOPY WITH PROPOFOL N/A 10/29/2015   Procedure: COLONOSCOPY WITH PROPOFOL;  Surgeon: Lollie Sails, MD;  Location: [facility];  Service: Endoscopy;  Laterality: N/A;   CORONARY/GRAFT ACUTE MI REVASCULARIZATION N/A 02/26/2020   Procedure: Coronary/Graft Acute MI Revascularization;  Surgeon: Jettie Booze, MD;  Location: [place] [facility];  Service: Cardiovascular;  Laterality: N/A;   DIALYSIS/PERMA CATHETER INSERTION Right 04/26/2019   Perm Cath    DIALYSIS/PERMA CATHETER INSERTION N/A 04/26/2019   Procedure: DIALYSIS/PERMA CATHETER INSERTION;  Surgeon: Algernon Huxley, MD;  Location: [place] [facility];  Service: Cardiovascular;  Laterality: N/A;   DIALYSIS/PERMA CATHETER INSERTION N/A 05/25/2021   Procedure: DIALYSIS/PERMA CATHETER INSERTION;  Surgeon: Algernon Huxley, MD;  Location: [place]  [facility];  Service: Cardiovascular;  Laterality: N/A;   DIALYSIS/PERMA CATHETER INSERTION N/A 08/31/2021   Procedure: DIALYSIS/PERMA CATHETER INSERTION;  Surgeon: Algernon Huxley, MD;  Location: [place] [facility];  Service: Cardiovascular;  Laterality: N/A;   DIALYSIS/PERMA CATHETER REMOVAL N/A 08/15/2019   Procedure: DIALYSIS/PERMA CATHETER REMOVAL;  Surgeon: Katha Cabal, MD;  Location: [place] [facility];  Service: Cardiovascular;  Laterality: N/A;   EMBOLIZATION Right 02/06/2021   Procedure: EMBOLIZATION;  Surgeon: Algernon Huxley, MD;  Location: [place] [facility];  Service: Cardiovascular;  Laterality: Right;  Right Upper Extremity Dialysis Access, Permcath Placement   ESOPHAGOGASTRODUODENOSCOPY (EGD) WITH PROPOFOL N/A 12/27/2017   Procedure: ESOPHAGOGASTRODUODENOSCOPY (EGD) WITH PROPOFOL;  Surgeon: [place], Benay Pike, MD;  Location: [name] ENDOSCOPY;  Service: Gastroenterology;  Laterality: N/A;   EYE SURGERY     INTRAVASCULAR ULTRASOUND/IVUS N/A 02/26/2020   Procedure: Intravascular Ultrasound/IVUS;  Surgeon: Jettie Booze, MD;  Location: [place] [facility];  Service: Cardiovascular;  Laterality: N/A;   LEFT HEART CATH AND CORONARY ANGIOGRAPHY N/A 02/26/2020   Procedure: LEFT HEART CATH AND CORONARY ANGIOGRAPHY;  Surgeon: Jettie Booze, MD;  Location: [place] [facility];  Service: Cardiovascular;  Laterality: N/A;   PROSTATE SURGERY  2016   TONSILECTOMY/ADENOIDECTOMY WITH MYRINGOTOMY     TONSILLECTOMY     UPPER EXTREMITY ANGIOGRAPHY Right 11/26/2020   Procedure: Upper Extremity Angiography;  Surgeon: Algernon Huxley, MD;  Location: [place] [facility];  Service: Cardiovascular;  Laterality: Right;   UPPER EXTREMITY ANGIOGRAPHY Right 02/05/2021   Procedure: UPPER EXTREMITY ANGIOGRAPHY;  Surgeon: Algernon Huxley, MD;  Location: [place] [facility];  Service: Cardiovascular;  Laterality: Right;    Family History  Problem Relation Age of Onset   CAD Father    Stroke Father     Diabetes Mellitus II Mother    Kidney failure Mother    Schizophrenia Mother     Allergies  Allergen Reactions   Promethazine Diarrhea and Other (  See Comments)    Muscle cramps   Promethazine Hcl Other (See Comments)    Cramping all over  Other reaction(s): cramp all over   Other     Other reaction(s): Unknown Other reaction(s): Unknown   Malt Other (See Comments)    Cramping       Latest Ref Rng & Units 09/04/2021    1:40 PM 08/13/2021   11:56 AM 07/30/2021    8:48 AM  CBC  WBC 4.0 - 10.5 K/uL 6.7   8.4   Hemoglobin 13.0 - 17.0 g/dL 7.3  9.9  8.4   Hematocrit 39.0 - 52.0 % 24.5  29.0  28.8   Platelets 150 - 400 K/uL 188   132       CMP     Component Value Date/Time   NA 142 09/04/2021 1340   K 3.3 (L) 09/04/2021 1340   CL 108 09/04/2021 1340   CO2 21 (L) 09/04/2021 1340   GLUCOSE 133 (H) 09/04/2021 1340   BUN 51 (H) 09/04/2021 1340   CREATININE 10.67 (H) 09/04/2021 1340   CALCIUM 8.3 (L) 09/04/2021 1340   PROT 6.5 09/04/2021 1340   ALBUMIN 2.8 (L) 09/04/2021 1340   AST 9 (L) 09/04/2021 1340   ALT <5 09/04/2021 1340   ALKPHOS 77 09/04/2021 1340   BILITOT 0.3 09/04/2021 1340   GFRNONAA 5 (L) 09/04/2021 1340   GFRAA 5 (L) 08/27/2019 1540     No results found.     Assessment & Plan:   1. ESRD (end stage renal disease) ([place]) Today's studies did not show significant reason for the patient's having significant arm swelling.  We will try conservative compression therapy with elevation to see if this alleviates his symptoms.  We will have him return in 1 week.  In the interim we will prescribe Doxy as well as reports of treatment.   Current Outpatient Medications on File Prior to Visit  Medication Sig Dispense Refill   acetaminophen (TYLENOL) 325 MG tablet Take 1-2 tablets (325-650 mg total) by mouth every 4 (four) hours as needed for mild pain.     amiodarone (PACERONE) 200 MG tablet Take 1 tablet (200 mg total) by mouth every morning. 90 tablet 3    amLODipine (NORVASC) 5 MG tablet Take 5 mg by mouth daily.     atorvastatin (LIPITOR) 80 MG tablet Take 1 tablet (80 mg total) by mouth daily. (Patient taking differently: Take 80 mg by mouth at bedtime.) 30 tablet 0   calcium acetate (PHOSLO) 667 MG capsule Take 2 capsules (1,334 mg total) by mouth 3 (three) times daily with meals. 180 capsule 0   carvedilol (COREG) 25 MG tablet Take 25 mg by mouth 2 (two) times daily.     clonazePAM (KLONOPIN) 0.5 MG disintegrating tablet Take 1 tablet (0.5 mg total) by mouth daily as needed for seizure (for seizures). 10 tablet 0   cloNIDine (CATAPRES) 0.1 MG tablet Take 1 tablet (0.1 mg total) by mouth 3 (three) times daily as needed (systolic blood pressure (top number) is Greater than 200 or DBP (lower number)is Greater than 110). 30 tablet 3   clopidogrel (PLAVIX) 75 MG tablet Take 1 tablet (75 mg total) by mouth daily. 30 tablet 0   dorzolamide-timolol (COSOPT) 22.3-6.8 MG/ML ophthalmic solution Place 1 drop into the right eye 2 (two) times daily.     ELIQUIS 2.5 MG TABS tablet Take 2 tablets (5 mg total) by mouth 2 (two) times daily. 60 tablet 3   erythromycin  ophthalmic ointment Place 1 Application into both eyes daily.     HYDROcodone-acetaminophen (NORCO/VICODIN) 5-325 MG tablet Take 2 tablets by mouth every 6 (six) hours as needed for moderate pain. 20 tablet 0   hydrocortisone 2.5 % cream Place rectally.     lactobacillus (FLORANEX/LACTINEX) PACK Take 1 packet (1 g total) by mouth 3 (three) times daily with meals. 90 packet 0   levETIRAcetam (KEPPRA) 250 MG tablet Take 1 tablet (250 mg total) by mouth Every Tuesday,Thursday,and Saturday with dialysis. 15 tablet 1   levETIRAcetam (KEPPRA) 500 MG tablet Take 1 tablet (500 mg total) by mouth 2 (two) times daily. 60 tablet 2   melatonin 5 MG TABS Take 1 tablet (5 mg total) by mouth at bedtime as needed. 30 tablet 0   neomycin-polymyxin b-dexamethasone (MAXITROL) 3.5-10000-0.1 OINT Place 1 application. into  the left eye in the morning and at bedtime.     nitroGLYCERIN (NITROSTAT) 0.4 MG SL tablet Place 1 tablet (0.4 mg total) under the tongue every 5 (five) minutes as needed for chest pain.     phenylephrine (,USE FOR PREPARATION-H,) 0.25 % suppository Place 1 suppository rectally 2 (two) times daily as needed for hemorrhoids. 12 suppository 0   prednisoLONE acetate (PRED FORTE) 1 % ophthalmic suspension Place 1 drop into the left eye every 4 (four) hours. 5 mL 0   PREPARATION H 0.25-88.44 % suppository Place rectally.     tamsulosin (FLOMAX) 0.4 MG CAPS capsule Take 0.4 mg by mouth daily.     witch hazel-glycerin (TUCKS) pad Apply topically as needed for itching. 40 each 12   No current facility-administered medications on file prior to visit.    There are no Patient Instructions on file for this visit. No follow-ups on file.   Kris Hartmann, NP

## 2021-09-15 ENCOUNTER — Other Ambulatory Visit (INDEPENDENT_AMBULATORY_CARE_PROVIDER_SITE_OTHER): Payer: Self-pay | Admitting: Vascular Surgery

## 2021-09-15 DIAGNOSIS — N186 End stage renal disease: Secondary | ICD-10-CM

## 2021-09-16 ENCOUNTER — Ambulatory Visit (INDEPENDENT_AMBULATORY_CARE_PROVIDER_SITE_OTHER): Payer: Medicare HMO

## 2021-09-16 ENCOUNTER — Encounter (INDEPENDENT_AMBULATORY_CARE_PROVIDER_SITE_OTHER): Payer: Self-pay | Admitting: Nurse Practitioner

## 2021-09-16 ENCOUNTER — Ambulatory Visit (INDEPENDENT_AMBULATORY_CARE_PROVIDER_SITE_OTHER): Payer: Medicare HMO | Admitting: Nurse Practitioner

## 2021-09-16 VITALS — BP 168/80 | HR 64 | Resp 16

## 2021-09-16 DIAGNOSIS — N186 End stage renal disease: Secondary | ICD-10-CM | POA: Diagnosis not present

## 2021-09-16 DIAGNOSIS — Z992 Dependence on renal dialysis: Secondary | ICD-10-CM

## 2021-09-16 DIAGNOSIS — E782 Mixed hyperlipidemia: Secondary | ICD-10-CM

## 2021-09-16 DIAGNOSIS — I10 Essential (primary) hypertension: Secondary | ICD-10-CM

## 2021-09-16 NOTE — Progress Notes (Signed)
Subjective:    Patient ID: [name], male    DOB: 28-Feb-1970, 51 y.o.   MRN: [id number] Chief Complaint  Patient presents with   Follow-up    Ultrasound follow up    The patient returns today for follow-up evaluation of his left brachiocephalic AV fistula following significant swelling of his upper extremity.  He recently underwent a fistulogram and replacement of his dialysis catheter.  Today the swelling is greatly improved.  There is virtually no swelling in his hand area.  He still has some remaining swelling around the antecubital fossa area.  Today he underwent a CTA which showed a flow volume of 1965.  There does appear to be a hematoma in the distal upper arm measuring 3.19 x 1.58 cm with no flow.    Review of Systems  All other systems reviewed and are negative.      Objective:   Physical Exam Vitals reviewed.  HENT:     Head: Normocephalic.  Cardiovascular:     Rate and Rhythm: Normal rate.     Pulses:          Radial pulses are 1+ on the left side.     Arteriovenous access: Left arteriovenous access is present.    Comments: Good thrill and bruit Pulmonary:     Effort: Pulmonary effort is normal.  Skin:    General: Skin is warm and dry.  Neurological:     Mental Status: He is alert and oriented to person, place, and time.  Psychiatric:        Mood and Affect: Mood normal.        Behavior: Behavior normal.        Thought Content: Thought content normal.        Judgment: Judgment normal.     BP (!) 168/80 (BP Location: Right Arm)   Pulse 64   Resp 16   Past Medical History:  Diagnosis Date   Anxiety    a.) on BZO (clonazepam) PRN   Aortic dilatation ([place]) 11/25/2020   a.) TTE 11/25/2020: Ao root measured 38 mm. b.) TTE 04/22/2021: Ao root measured 44 mm.   Atrial fibrillation ([place])    a.) CHA2DS2-VASc = 6 (CHF, HTN, CVA x2, prior MI, T2DM). b.) rate/rhythm maintained on oral amiodarone + carvedilol; chronically anticoagulated using dose  reduced apixaban + clopidogrel   BPH (benign prostatic hyperplasia)    Cardiac arrest ([place]) 07/26/2021   a.) in setting of hyperkalemia, NSTEMI, and seizure following missed HD; pulseless and apneic --> required 1 round of CPR prior to ROSC   Cellulitis    CHF (congestive heart failure) ([place]) 07/19/2017   a.) TTE 07/19/2017: EF 75%; sev LVH; mild MR/PR/TR; G1DD. b.) TTE 11/25/2020: EF 55-60%; mod LVH; GLS -13.0; mild-mod BAE; triv MR; mild dilated PA; G2DD. c.) TTE 04/22/2021: EF 55-60%; sev LVH; sev LAE, triv MR; G1DD.   Chronic diarrhea    Chronic pain of both ankles    Coronary artery disease 02/26/2020   a.) anterior STEMI 02/26/2020 --> LHC/PCI --> EF 65%; 90% mLAD (3.5 x 30 mm Resolyte Onyx DES x 1). b.) NSTEMI 07/26/2021   Diabetes mellitus, type II ([place])    Diabetic neuropathy ([place])    Esophagitis    ESRD (end stage renal disease) on dialysis ([place])    a.) [facility]; T-Th-Sat   Frequent falls    Gait instability    Gastritis    Gastroparesis    GERD (gastroesophageal reflux disease)  H/O enucleation of left eyeball    Heart palpitations    History of kidney stones    HLD (hyperlipidemia)    Hypertension    Insomnia    a.) uses melatonin PRN   Long term current use of anticoagulant    a.) dose reduced apixaban   Long term current use of antithrombotics/antiplatelets    a.) clopidogrel   Nausea and vomiting in adult    recurrent   NSTEMI (non-ST elevated myocardial infarction) ([place]) 07/26/2021   a.) hyperkalemic from missed HD; seizures; decompensated to cardiac arrest and required CPR x 1 round prior to ROSC.   Seizure ([place])    a.) last 07/26/2021 in setting of missed HD --> hyperkalemic at 6.5 --> pulseless/apneic and required CPR; discharged home on levetiracetam.   ST elevation myocardial infarction (STEMI) of anterior wall ([place]) 02/26/2020   a.) LHC/PCI 02/26/2020 --> EF 65%; LVEDP 11 mmHg; 90% mLAD (3.5 x 20 mm Resolute Onyx DES x 1)   Stroke ([place]) 04/2021   a.)  documented as x 3 between 04/2021 and 06/2021    Social History   Socioeconomic History   Marital status: Married    Spouse name: [name]    Number of children: 2   Years of education: Not on file   Highest education level: High school graduate  Occupational History   Occupation: Disability    Comment: not employed  Tobacco Use   Smoking status: Never   Smokeless tobacco: Never  Vaping Use   Vaping Use: Never used  Substance and Sexual Activity   Alcohol use: No   Drug use: No   Sexual activity: Yes  Other Topics Concern   Not on file  Social History Narrative   Oldest son killed in car crash June 2020.    Social Determinants of Health   Financial Resource Strain: High Risk (07/05/2017)   Overall Financial Resource Strain (CARDIA)    Difficulty of Paying Living Expenses: Hard  Food Insecurity: Food Insecurity Present (07/05/2017)   Hunger Vital Sign    Worried About Running Out of Food in the Last Year: Often true    Ran Out of Food in the Last Year: Often true  Transportation Needs: No Transportation Needs (02/28/2020)   PRAPARE - Hydrologist (Medical): No    Lack of Transportation (Non-Medical): No  Physical Activity: Inactive (07/05/2017)   Exercise Vital Sign    Days of Exercise per Week: 0 days    Minutes of Exercise per Session: 0 min  Stress: Stress Concern Present (07/05/2017)   [place]    Feeling of Stress : Rather much  Social Connections: Moderately Integrated (07/05/2017)   Social Connection and Isolation Panel [NHANES]    Frequency of Communication with Friends and Family: Three times a week    Frequency of Social Gatherings with Friends and Family: Once a week    Attends Religious Services: More than 4 times per year    Active Member of [employer] or Organizations: No    Attends Archivist Meetings: Never    Marital Status: Married  Human resources officer  Violence: Not At Risk (07/05/2017)   Humiliation, Afraid, Rape, and Kick questionnaire    Fear of Current or Ex-Partner: No    Emotionally Abused: No    Physically Abused: No    Sexually Abused: No    Past Surgical History:  Procedure Laterality Date   A/V FISTULAGRAM Left 08/31/2021  Procedure: A/V Fistulagram;  Surgeon: Algernon Huxley, MD;  Location: [facility];  Service: Cardiovascular;  Laterality: Left;   AMPUTATION Left 03/16/2016   Procedure: AMPUTATION DIGIT LEFT HALLUX;  Surgeon: Trula Slade, DPM;  Location: [facility];  Service: Podiatry;  Laterality: Left;  can start around 5    AMPUTATION Right 02/09/2021   Procedure: AMPUTATION DIGIT;  Surgeon: Sherilyn Cooter, MD;  Location: [facility];  Service: Orthopedics;  Laterality: Right;   ARTHROSCOPIC REPAIR ACL Left    AV FISTULA PLACEMENT Right 05/31/2019   Procedure: Brachiocephalic AV fistula creation;  Surgeon: Algernon Huxley, MD;  Location: [name];  Service: Vascular;  Laterality: Right;   AV FISTULA PLACEMENT Left 08/13/2021   Procedure: ARTERIOVENOUS (AV) FISTULA CREATION (BRACHIALCEPHALIC);  Surgeon: Algernon Huxley, MD;  Location: [name];  Service: Vascular;  Laterality: Left;   COLONOSCOPY WITH PROPOFOL N/A 10/28/2015   Procedure: COLONOSCOPY WITH PROPOFOL;  Surgeon: Lollie Sails, MD;  Location: [facility];  Service: Endoscopy;  Laterality: N/A;   COLONOSCOPY WITH PROPOFOL N/A 10/29/2015   Procedure: COLONOSCOPY WITH PROPOFOL;  Surgeon: Lollie Sails, MD;  Location: [facility];  Service: Endoscopy;  Laterality: N/A;   CORONARY/GRAFT ACUTE MI REVASCULARIZATION N/A 02/26/2020   Procedure: Coronary/Graft Acute MI Revascularization;  Surgeon: Jettie Booze, MD;  Location: [facility];  Service: Cardiovascular;  Laterality: N/A;   DIALYSIS/PERMA CATHETER INSERTION Right 04/26/2019   Perm Cath    DIALYSIS/PERMA CATHETER INSERTION N/A 04/26/2019   Procedure: DIALYSIS/PERMA CATHETER INSERTION;   Surgeon: Algernon Huxley, MD;  Location: [facility];  Service: Cardiovascular;  Laterality: N/A;   DIALYSIS/PERMA CATHETER INSERTION N/A 05/25/2021   Procedure: DIALYSIS/PERMA CATHETER INSERTION;  Surgeon: Algernon Huxley, MD;  Location: [facility];  Service: Cardiovascular;  Laterality: N/A;   DIALYSIS/PERMA CATHETER INSERTION N/A 08/31/2021   Procedure: DIALYSIS/PERMA CATHETER INSERTION;  Surgeon: Algernon Huxley, MD;  Location: [facility];  Service: Cardiovascular;  Laterality: N/A;   DIALYSIS/PERMA CATHETER REMOVAL N/A 08/15/2019   Procedure: DIALYSIS/PERMA CATHETER REMOVAL;  Surgeon: Katha Cabal, MD;  Location: [facility];  Service: Cardiovascular;  Laterality: N/A;   EMBOLIZATION Right 02/06/2021   Procedure: EMBOLIZATION;  Surgeon: Algernon Huxley, MD;  Location: [facility];  Service: Cardiovascular;  Laterality: Right;  Right Upper Extremity Dialysis Access, Permcath Placement   ESOPHAGOGASTRODUODENOSCOPY (EGD) WITH PROPOFOL N/A 12/27/2017   Procedure: ESOPHAGOGASTRODUODENOSCOPY (EGD) WITH PROPOFOL;  Surgeon: [place], Benay Pike, MD;  Location: [name];  Service: Gastroenterology;  Laterality: N/A;   EYE SURGERY     INTRAVASCULAR ULTRASOUND/IVUS N/A 02/26/2020   Procedure: Intravascular Ultrasound/IVUS;  Surgeon: Jettie Booze, MD;  Location: [facility];  Service: Cardiovascular;  Laterality: N/A;   LEFT HEART CATH AND CORONARY ANGIOGRAPHY N/A 02/26/2020   Procedure: LEFT HEART CATH AND CORONARY ANGIOGRAPHY;  Surgeon: Jettie Booze, MD;  Location: [facility];  Service: Cardiovascular;  Laterality: N/A;   PROSTATE SURGERY  2016   TONSILECTOMY/ADENOIDECTOMY WITH MYRINGOTOMY     TONSILLECTOMY     UPPER EXTREMITY ANGIOGRAPHY Right 11/26/2020   Procedure: Upper Extremity Angiography;  Surgeon: Algernon Huxley, MD;  Location: [facility];  Service: Cardiovascular;  Laterality: Right;   UPPER EXTREMITY  ANGIOGRAPHY Right 02/05/2021   Procedure: UPPER EXTREMITY ANGIOGRAPHY;  Surgeon: Algernon Huxley, MD;  Location: [facility];  Service: Cardiovascular;  Laterality: Right;    Family History  Problem Relation Age of Onset   CAD Father    Stroke Father    Diabetes Mellitus II Mother    Kidney failure Mother    Schizophrenia Mother     Allergies  Allergen Reactions   Promethazine Diarrhea and Other (See Comments)    Muscle cramps   Promethazine Hcl Other (See Comments)    Cramping all over  Other reaction(s): cramp all over   Other     Other reaction(s): Unknown Other reaction(s): Unknown   Malt Other (See Comments)    Cramping       Latest Ref Rng & Units 09/04/2021    1:40 PM 08/13/2021   11:56 AM 07/30/2021    8:48 AM  CBC  WBC 4.0 - 10.5 K/uL 6.7   8.4   Hemoglobin 13.0 - 17.0 g/dL 7.3  9.9  8.4   Hematocrit 39.0 - 52.0 % 24.5  29.0  28.8   Platelets 150 - 400 K/uL 188   132       CMP     Component Value Date/Time   NA 142 09/04/2021 1340   K 3.3 (L) 09/04/2021 1340   CL 108 09/04/2021 1340   CO2 21 (L) 09/04/2021 1340   GLUCOSE 133 (H) 09/04/2021 1340   BUN 51 (H) 09/04/2021 1340   CREATININE 10.67 (H) 09/04/2021 1340   CALCIUM 8.3 (L) 09/04/2021 1340   PROT 6.5 09/04/2021 1340   ALBUMIN 2.8 (L) 09/04/2021 1340   AST 9 (L) 09/04/2021 1340   ALT <5 09/04/2021 1340   ALKPHOS 77 09/04/2021 1340   BILITOT 0.3 09/04/2021 1340   GFRNONAA 5 (L) 09/04/2021 1340   GFRAA 5 (L) 08/27/2019 1540     No results found.     Assessment & Plan:   1. ESRD (end stage renal disease) ([place]) The patient's upper extremity swelling is much improved but not completely resolved.  The area around the antecubital fossa and upper arm still has some remaining swelling  .  Given that as well as the fact that his fistula is only been in place for 2 weeks late to the fact that his fistula is not easily palpable we will have the patient return in 4 to 5 weeks to determine if his  fistula is mature for use at that time.  2. Essential hypertension, benign Continue antihypertensive medications as already ordered, these medications have been reviewed and there are no changes at this time.   3. Mixed hyperlipidemia Continue statin as ordered and reviewed, no changes at this time    Current Outpatient Medications on File Prior to Visit  Medication Sig Dispense Refill   acetaminophen (TYLENOL) 325 MG tablet Take 1-2 tablets (325-650 mg total) by mouth every 4 (four) hours as needed for mild pain.     amiodarone (PACERONE) 200 MG tablet Take 1 tablet (200 mg total) by mouth every morning. 90 tablet 3   amLODipine (NORVASC) 5 MG tablet Take 5 mg by mouth daily.     atorvastatin (LIPITOR) 80 MG tablet Take 1 tablet (80 mg total) by mouth daily. (Patient taking differently: Take 80 mg by mouth at bedtime.) 30 tablet 0   calcium acetate (PHOSLO) 667 MG capsule Take 2 capsules (1,334 mg total) by mouth 3 (three) times daily with meals. 180 capsule 0   carvedilol (COREG) 25 MG tablet Take 25 mg by mouth 2 (two) times daily.     cloNIDine (CATAPRES) 0.1 MG tablet Take 1 tablet (0.1 mg total) by mouth  3 (three) times daily as needed (systolic blood pressure (top number) is Greater than 200 or DBP (lower number)is Greater than 110). 30 tablet 3   clopidogrel (PLAVIX) 75 MG tablet Take 1 tablet (75 mg total) by mouth daily. 30 tablet 0   dorzolamide-timolol (COSOPT) 22.3-6.8 MG/ML ophthalmic solution Place 1 drop into the right eye 2 (two) times daily.     doxycycline (VIBRAMYCIN) 100 MG capsule Take 1 capsule (100 mg total) by mouth 2 (two) times daily. 28 capsule 0   ELIQUIS 2.5 MG TABS tablet Take 2 tablets (5 mg total) by mouth 2 (two) times daily. 60 tablet 3   erythromycin ophthalmic ointment Place 1 Application into both eyes daily.     HYDROcodone-acetaminophen (NORCO/VICODIN) 5-325 MG tablet Take 2 tablets by mouth every 6 (six) hours as needed for moderate pain. 20 tablet 0    hydrocortisone 2.5 % cream Place rectally.     lactobacillus (FLORANEX/LACTINEX) PACK Take 1 packet (1 g total) by mouth 3 (three) times daily with meals. 90 packet 0   levETIRAcetam (KEPPRA) 250 MG tablet Take 1 tablet (250 mg total) by mouth Every Tuesday,Thursday,and Saturday with dialysis. 15 tablet 1   levETIRAcetam (KEPPRA) 500 MG tablet Take 1 tablet (500 mg total) by mouth 2 (two) times daily. 60 tablet 2   melatonin 5 MG TABS Take 1 tablet (5 mg total) by mouth at bedtime as needed. 30 tablet 0   mupirocin ointment (BACTROBAN) 2 % Apply 1 Application topically 2 (two) times daily. 22 g 0   neomycin-polymyxin b-dexamethasone (MAXITROL) 3.5-10000-0.1 OINT Place 1 application. into the left eye in the morning and at bedtime.     nitroGLYCERIN (NITROSTAT) 0.4 MG SL tablet Place 1 tablet (0.4 mg total) under the tongue every 5 (five) minutes as needed for chest pain.     phenylephrine (,USE FOR PREPARATION-H,) 0.25 % suppository Place 1 suppository rectally 2 (two) times daily as needed for hemorrhoids. 12 suppository 0   prednisoLONE acetate (PRED FORTE) 1 % ophthalmic suspension Place 1 drop into the left eye every 4 (four) hours. 5 mL 0   PREPARATION H 0.25-88.44 % suppository Place rectally.     tamsulosin (FLOMAX) 0.4 MG CAPS capsule Take 0.4 mg by mouth daily.     witch hazel-glycerin (TUCKS) pad Apply topically as needed for itching. 40 each 12   clonazePAM (KLONOPIN) 0.5 MG disintegrating tablet Take 1 tablet (0.5 mg total) by mouth daily as needed for seizure (for seizures). 10 tablet 0   No current facility-administered medications on file prior to visit.    There are no Patient Instructions on file for this visit. No follow-ups on file.   Kris Hartmann, NP

## 2021-10-01 ENCOUNTER — Other Ambulatory Visit (INDEPENDENT_AMBULATORY_CARE_PROVIDER_SITE_OTHER): Payer: Self-pay | Admitting: Vascular Surgery

## 2021-10-01 DIAGNOSIS — N186 End stage renal disease: Secondary | ICD-10-CM

## 2021-10-02 ENCOUNTER — Ambulatory Visit (INDEPENDENT_AMBULATORY_CARE_PROVIDER_SITE_OTHER): Payer: Medicare HMO | Admitting: Nurse Practitioner

## 2021-10-02 ENCOUNTER — Ambulatory Visit (INDEPENDENT_AMBULATORY_CARE_PROVIDER_SITE_OTHER): Payer: Medicare HMO

## 2021-10-02 DIAGNOSIS — Z992 Dependence on renal dialysis: Secondary | ICD-10-CM | POA: Diagnosis not present

## 2021-10-02 DIAGNOSIS — N186 End stage renal disease: Secondary | ICD-10-CM | POA: Diagnosis not present

## 2021-10-02 DIAGNOSIS — E782 Mixed hyperlipidemia: Secondary | ICD-10-CM

## 2021-10-03 ENCOUNTER — Encounter (INDEPENDENT_AMBULATORY_CARE_PROVIDER_SITE_OTHER): Payer: Self-pay | Admitting: Nurse Practitioner

## 2021-10-03 NOTE — Progress Notes (Signed)
Subjective:    Patient ID: [name], male    DOB: 08/02/1970, 51 y.o.   MRN: [id number] No chief complaint on file.   The patient returns today for follow-up evaluation of his dialysis access.  The patient recently underwent intervention including angioplasty of his central veins and relocation of his PermCath.  The patient previously still has some swelling above his antecubital fossa.  Today the swelling is all resolved in his upper extremity.  He denies any pain.  The patient has a flow volume of 1416.    Review of Systems  All other systems reviewed and are negative.      Objective:   Physical Exam Vitals reviewed.  HENT:     Head: Normocephalic.  Cardiovascular:     Rate and Rhythm: Normal rate.     Pulses: Normal pulses.     Arteriovenous access: Left arteriovenous access is present.    Comments: Good thrill and bruit Pulmonary:     Effort: Pulmonary effort is normal.  Skin:    General: Skin is warm and dry.  Neurological:     Mental Status: He is alert and oriented to person, place, and time.  Psychiatric:        Mood and Affect: Mood normal.        Behavior: Behavior normal.        Thought Content: Thought content normal.        Judgment: Judgment normal.     There were no vitals taken for this visit.  Past Medical History:  Diagnosis Date   Anxiety    a.) on BZO (clonazepam) PRN   Aortic dilatation ([place]) 11/25/2020   a.) TTE 11/25/2020: Ao root measured 38 mm. b.) TTE 04/22/2021: Ao root measured 44 mm.   Atrial fibrillation ([place])    a.) CHA2DS2-VASc = 6 (CHF, HTN, CVA x2, prior MI, T2DM). b.) rate/rhythm maintained on oral amiodarone + carvedilol; chronically anticoagulated using dose reduced apixaban + clopidogrel   BPH (benign prostatic hyperplasia)    Cardiac arrest ([place]) 07/26/2021   a.) in setting of hyperkalemia, NSTEMI, and seizure following missed HD; pulseless and apneic --> required 1 round of CPR prior to ROSC   Cellulitis    CHF  (congestive heart failure) ([place]) 07/19/2017   a.) TTE 07/19/2017: EF 75%; sev LVH; mild MR/PR/TR; G1DD. b.) TTE 11/25/2020: EF 55-60%; mod LVH; GLS -13.0; mild-mod BAE; triv MR; mild dilated PA; G2DD. c.) TTE 04/22/2021: EF 55-60%; sev LVH; sev LAE, triv MR; G1DD.   Chronic diarrhea    Chronic pain of both ankles    Coronary artery disease 02/26/2020   a.) anterior STEMI 02/26/2020 --> LHC/PCI --> EF 65%; 90% mLAD (3.5 x 30 mm Resolyte Onyx DES x 1). b.) NSTEMI 07/26/2021   Diabetes mellitus, type II ([place])    Diabetic neuropathy ([place])    Esophagitis    ESRD (end stage renal disease) on dialysis ([place])    a.) [facility]; T-Th-Sat   Frequent falls    Gait instability    Gastritis    Gastroparesis    GERD (gastroesophageal reflux disease)    H/O enucleation of left eyeball    Heart palpitations    History of kidney stones    HLD (hyperlipidemia)    Hypertension    Insomnia    a.) uses melatonin PRN   Long term current use of anticoagulant    a.) dose reduced apixaban   Long term current use of antithrombotics/antiplatelets    a.)  clopidogrel   Nausea and vomiting in adult    recurrent   NSTEMI (non-ST elevated myocardial infarction) ([place]) 07/26/2021   a.) hyperkalemic from missed HD; seizures; decompensated to cardiac arrest and required CPR x 1 round prior to ROSC.   Seizure ([place])    a.) last 07/26/2021 in setting of missed HD --> hyperkalemic at 6.5 --> pulseless/apneic and required CPR; discharged home on levetiracetam.   ST elevation myocardial infarction (STEMI) of anterior wall ([place]) 02/26/2020   a.) LHC/PCI 02/26/2020 --> EF 65%; LVEDP 11 mmHg; 90% mLAD (3.5 x 20 mm Resolute Onyx DES x 1)   Stroke ([place]) 04/2021   a.) documented as x 3 between 04/2021 and 06/2021    Social History   Socioeconomic History   Marital status: Married    Spouse name: [name]    Number of children: 2   Years of education: Not on file   Highest education level: High school graduate  Occupational  History   Occupation: Disability    Comment: not employed  Tobacco Use   Smoking status: Never   Smokeless tobacco: Never  Vaping Use   Vaping Use: Never used  Substance and Sexual Activity   Alcohol use: No   Drug use: No   Sexual activity: Yes  Other Topics Concern   Not on file  Social History Narrative   Oldest son killed in car crash June 2020.    Social Determinants of Health   Financial Resource Strain: High Risk (07/05/2017)   Overall Financial Resource Strain (CARDIA)    Difficulty of Paying Living Expenses: Hard  Food Insecurity: Food Insecurity Present (07/05/2017)   Hunger Vital Sign    Worried About Running Out of Food in the Last Year: Often true    Ran Out of Food in the Last Year: Often true  Transportation Needs: No Transportation Needs (02/28/2020)   PRAPARE - Hydrologist (Medical): No    Lack of Transportation (Non-Medical): No  Physical Activity: Inactive (07/05/2017)   Exercise Vital Sign    Days of Exercise per Week: 0 days    Minutes of Exercise per Session: 0 min  Stress: Stress Concern Present (07/05/2017)   [place]    Feeling of Stress : Rather much  Social Connections: Moderately Integrated (07/05/2017)   Social Connection and Isolation Panel [NHANES]    Frequency of Communication with Friends and Family: Three times a week    Frequency of Social Gatherings with Friends and Family: Once a week    Attends Religious Services: More than 4 times per year    Active Member of [employer] or Organizations: No    Attends Archivist Meetings: Never    Marital Status: Married  Human resources officer Violence: Not At Risk (07/05/2017)   Humiliation, Afraid, Rape, and Kick questionnaire    Fear of Current or Ex-Partner: No    Emotionally Abused: No    Physically Abused: No    Sexually Abused: No    Past Surgical History:  Procedure Laterality Date   A/V  FISTULAGRAM Left 08/31/2021   Procedure: A/V Fistulagram;  Surgeon: Algernon Huxley, MD;  Location: [place] [facility];  Service: Cardiovascular;  Laterality: Left;   AMPUTATION Left 03/16/2016   Procedure: AMPUTATION DIGIT LEFT HALLUX;  Surgeon: Trula Slade, DPM;  Location: [place];  Service: Podiatry;  Laterality: Left;  can start around 5    AMPUTATION Right 02/09/2021  Procedure: AMPUTATION DIGIT;  Surgeon: Sherilyn Cooter, MD;  Location: [place];  Service: Orthopedics;  Laterality: Right;   ARTHROSCOPIC REPAIR ACL Left    AV FISTULA PLACEMENT Right 05/31/2019   Procedure: Brachiocephalic AV fistula creation;  Surgeon: Algernon Huxley, MD;  Location: [name];  Service: Vascular;  Laterality: Right;   AV FISTULA PLACEMENT Left 08/13/2021   Procedure: ARTERIOVENOUS (AV) FISTULA CREATION (BRACHIALCEPHALIC);  Surgeon: Algernon Huxley, MD;  Location: [name];  Service: Vascular;  Laterality: Left;   COLONOSCOPY WITH PROPOFOL N/A 10/28/2015   Procedure: COLONOSCOPY WITH PROPOFOL;  Surgeon: Lollie Sails, MD;  Location: [facility];  Service: Endoscopy;  Laterality: N/A;   COLONOSCOPY WITH PROPOFOL N/A 10/29/2015   Procedure: COLONOSCOPY WITH PROPOFOL;  Surgeon: Lollie Sails, MD;  Location[facility];  Service: Endoscopy;  Laterality: N/A;   CORONARY/GRAFT ACUTE MI REVASCULARIZATION N/A 02/26/2020   Procedure: Coronary/Graft Acute MI Revascularization;  Surgeon: Jettie Booze, MD;  Location: [place] [facility];  Service: Cardiovascular;  Laterality: N/A;   DIALYSIS/PERMA CATHETER INSERTION Right 04/26/2019   Perm Cath    DIALYSIS/PERMA CATHETER INSERTION N/A 04/26/2019   Procedure: DIALYSIS/PERMA CATHETER INSERTION;  Surgeon: Algernon Huxley, MD;  Location: [place] [facility];  Service: Cardiovascular;  Laterality: N/A;   DIALYSIS/PERMA CATHETER INSERTION N/A 05/25/2021   Procedure: DIALYSIS/PERMA CATHETER INSERTION;  Surgeon: Algernon Huxley, MD;  Location: [place] [facility];   Service: Cardiovascular;  Laterality: N/A;   DIALYSIS/PERMA CATHETER INSERTION N/A 08/31/2021   Procedure: DIALYSIS/PERMA CATHETER INSERTION;  Surgeon: Algernon Huxley, MD;  Location: [place] [facility];  Service: Cardiovascular;  Laterality: N/A;   DIALYSIS/PERMA CATHETER REMOVAL N/A 08/15/2019   Procedure: DIALYSIS/PERMA CATHETER REMOVAL;  Surgeon: Katha Cabal, MD;  Location: [place] [facility];  Service: Cardiovascular;  Laterality: N/A;   EMBOLIZATION Right 02/06/2021   Procedure: EMBOLIZATION;  Surgeon: Algernon Huxley, MD;  Location: [place] [facility];  Service: Cardiovascular;  Laterality: Right;  Right Upper Extremity Dialysis Access, Permcath Placement   ESOPHAGOGASTRODUODENOSCOPY (EGD) WITH PROPOFOL N/A 12/27/2017   Procedure: ESOPHAGOGASTRODUODENOSCOPY (EGD) WITH PROPOFOL;  Surgeon: [place], Benay Pike, MD;  Location: [name];  Service: Gastroenterology;  Laterality: N/A;   EYE SURGERY     INTRAVASCULAR ULTRASOUND/IVUS N/A 02/26/2020   Procedure: Intravascular Ultrasound/IVUS;  Surgeon: Jettie Booze, MD;  Location: [place] [facility];  Service: Cardiovascular;  Laterality: N/A;   LEFT HEART CATH AND CORONARY ANGIOGRAPHY N/A 02/26/2020   Procedure: LEFT HEART CATH AND CORONARY ANGIOGRAPHY;  Surgeon: Jettie Booze, MD;  Location: [place] [facility];  Service: Cardiovascular;  Laterality: N/A;   PROSTATE SURGERY  2016   TONSILECTOMY/ADENOIDECTOMY WITH MYRINGOTOMY     TONSILLECTOMY     UPPER EXTREMITY ANGIOGRAPHY Right 11/26/2020   Procedure: Upper Extremity Angiography;  Surgeon: Algernon Huxley, MD;  Location: [place] [facility];  Service: Cardiovascular;  Laterality: Right;   UPPER EXTREMITY ANGIOGRAPHY Right 02/05/2021   Procedure: UPPER EXTREMITY ANGIOGRAPHY;  Surgeon: Algernon Huxley, MD;  Location: [place] [facility];  Service: Cardiovascular;  Laterality: Right;    Family History  Problem Relation Age of Onset   CAD Father    Stroke Father     Diabetes Mellitus II Mother    Kidney failure Mother    Schizophrenia Mother     Allergies  Allergen Reactions   Promethazine Diarrhea and Other (See Comments)    Muscle cramps   Promethazine Hcl Other (See Comments)  Cramping all over  Other reaction(s): cramp all over   Other     Other reaction(s): Unknown Other reaction(s): Unknown   Malt Other (See Comments)    Cramping       Latest Ref Rng & Units 09/04/2021    1:40 PM 08/13/2021   11:56 AM 07/30/2021    8:48 AM  CBC  WBC 4.0 - 10.5 K/uL 6.7   8.4   Hemoglobin 13.0 - 17.0 g/dL 7.3  9.9  8.4   Hematocrit 39.0 - 52.0 % 24.5  29.0  28.8   Platelets 150 - 400 K/uL 188   132       CMP     Component Value Date/Time   NA 142 09/04/2021 1340   K 3.3 (L) 09/04/2021 1340   CL 108 09/04/2021 1340   CO2 21 (L) 09/04/2021 1340   GLUCOSE 133 (H) 09/04/2021 1340   BUN 51 (H) 09/04/2021 1340   CREATININE 10.67 (H) 09/04/2021 1340   CALCIUM 8.3 (L) 09/04/2021 1340   PROT 6.5 09/04/2021 1340   ALBUMIN 2.8 (L) 09/04/2021 1340   AST 9 (L) 09/04/2021 1340   ALT <5 09/04/2021 1340   ALKPHOS 77 09/04/2021 1340   BILITOT 0.3 09/04/2021 1340   GFRNONAA 5 (L) 09/04/2021 1340   GFRAA 5 (L) 08/27/2019 1540     No results found.     Assessment & Plan:   1. ESRD (end stage renal disease) on dialysis [facility]) The patient's swelling has fully subsided in his upper extremity.  Today the patient has a patent flow volume with no evidence of significant stenosis in the access.  Based on this we will fax a letter to his dialysis center indicating that it is now appropriate for use.  We will maintain close follow-up and have the patient return in 3 months with an HDA. - VAS US DUPLEX DIALYSIS ACCESS (AVF,AVG)  2. Mixed hyperlipidemia Continue statin as ordered and reviewed, no changes at this time    Current Outpatient Medications on File Prior to Visit  Medication Sig Dispense Refill   acetaminophen (TYLENOL) 325 MG tablet Take 1-2  tablets (325-650 mg total) by mouth every 4 (four) hours as needed for mild pain.     amiodarone (PACERONE) 200 MG tablet Take 1 tablet (200 mg total) by mouth every morning. 90 tablet 3   amLODipine (NORVASC) 5 MG tablet Take 5 mg by mouth daily.     atorvastatin (LIPITOR) 80 MG tablet Take 1 tablet (80 mg total) by mouth daily. (Patient taking differently: Take 80 mg by mouth at bedtime.) 30 tablet 0   calcium acetate (PHOSLO) 667 MG capsule Take 2 capsules (1,334 mg total) by mouth 3 (three) times daily with meals. 180 capsule 0   carvedilol (COREG) 25 MG tablet Take 25 mg by mouth 2 (two) times daily.     cloNIDine (CATAPRES) 0.1 MG tablet Take 1 tablet (0.1 mg total) by mouth 3 (three) times daily as needed (systolic blood pressure (top number) is Greater than 200 or DBP (lower number)is Greater than 110). 30 tablet 3   clopidogrel (PLAVIX) 75 MG tablet Take 1 tablet (75 mg total) by mouth daily. 30 tablet 0   dorzolamide-timolol (COSOPT) 22.3-6.8 MG/ML ophthalmic solution Place 1 drop into the right eye 2 (two) times daily.     doxycycline (VIBRAMYCIN) 100 MG capsule Take 1 capsule (100 mg total) by mouth 2 (two) times daily. 28 capsule 0   ELIQUIS 2.5 MG TABS tablet Take  2 tablets (5 mg total) by mouth 2 (two) times daily. 60 tablet 3   erythromycin ophthalmic ointment Place 1 Application into both eyes daily.     HYDROcodone-acetaminophen (NORCO/VICODIN) 5-325 MG tablet Take 2 tablets by mouth every 6 (six) hours as needed for moderate pain. 20 tablet 0   hydrocortisone 2.5 % cream Place rectally.     lactobacillus (FLORANEX/LACTINEX) PACK Take 1 packet (1 g total) by mouth 3 (three) times daily with meals. 90 packet 0   levETIRAcetam (KEPPRA) 250 MG tablet Take 1 tablet (250 mg total) by mouth Every Tuesday,Thursday,and Saturday with dialysis. 15 tablet 1   levETIRAcetam (KEPPRA) 500 MG tablet Take 1 tablet (500 mg total) by mouth 2 (two) times daily. 60 tablet 2   melatonin 5 MG TABS Take  1 tablet (5 mg total) by mouth at bedtime as needed. 30 tablet 0   mupirocin ointment (BACTROBAN) 2 % Apply 1 Application topically 2 (two) times daily. 22 g 0   neomycin-polymyxin b-dexamethasone (MAXITROL) 3.5-10000-0.1 OINT Place 1 application. into the left eye in the morning and at bedtime.     nitroGLYCERIN (NITROSTAT) 0.4 MG SL tablet Place 1 tablet (0.4 mg total) under the tongue every 5 (five) minutes as needed for chest pain.     phenylephrine (,USE FOR PREPARATION-H,) 0.25 % suppository Place 1 suppository rectally 2 (two) times daily as needed for hemorrhoids. 12 suppository 0   prednisoLONE acetate (PRED FORTE) 1 % ophthalmic suspension Place 1 drop into the left eye every 4 (four) hours. 5 mL 0   PREPARATION H 0.25-88.44 % suppository Place rectally.     tamsulosin (FLOMAX) 0.4 MG CAPS capsule Take 0.4 mg by mouth daily.     witch hazel-glycerin (TUCKS) pad Apply topically as needed for itching. 40 each 12   clonazePAM (KLONOPIN) 0.5 MG disintegrating tablet Take 1 tablet (0.5 mg total) by mouth daily as needed for seizure (for seizures). 10 tablet 0   No current facility-administered medications on file prior to visit.    There are no Patient Instructions on file for this visit. No follow-ups on file.   Kris Hartmann, NP

## 2021-10-07 ENCOUNTER — Telehealth (INDEPENDENT_AMBULATORY_CARE_PROVIDER_SITE_OTHER): Payer: Self-pay

## 2021-10-07 ENCOUNTER — Telehealth (INDEPENDENT_AMBULATORY_CARE_PROVIDER_SITE_OTHER): Payer: Self-pay | Admitting: Nurse Practitioner

## 2021-10-07 NOTE — Telephone Encounter (Signed)
Patient called in stated he was told that his access for dialysis could be used, but at dialysis his arm access was having problems. He demanded to be seen right now. I attempted to explain to the patient that I do not schedule appts in our office only at the hospital and the patient became more irate and demanded to be seen right now. I advised that if his situation was that dire he needed to be seen at the ED for evaluation and the patient stated the ED did not do this. Again I attempted to explain that I would transfer him to the front desk to get an appt and he hung up.

## 2021-10-07 NOTE — Telephone Encounter (Signed)
Patient has been schedule.

## 2021-10-07 NOTE — Telephone Encounter (Signed)
Pt called to make appt - after speaking with Janyce Llanos and [facility], we decided to split appointments up in order to quicker appts. Patient kept stating - you don't understand, my arm is swollen and hurts. I again suggested that if it was bad to go to the ED. Pt stated that he will not spend 1/2 day at the ED. I made an appt for HDA on Friday at 2:00 and a follow up with Dr. Lucky Cowboy on Tuesday 10/13/21

## 2021-10-07 NOTE — Telephone Encounter (Signed)
Patient called and stated he needed an appointment today because his arm is swollen very bad.  Please advise.

## 2021-10-09 ENCOUNTER — Other Ambulatory Visit (INDEPENDENT_AMBULATORY_CARE_PROVIDER_SITE_OTHER): Payer: Self-pay | Admitting: Nurse Practitioner

## 2021-10-09 ENCOUNTER — Encounter (INDEPENDENT_AMBULATORY_CARE_PROVIDER_SITE_OTHER): Payer: Medicare HMO

## 2021-10-09 DIAGNOSIS — T82898S Other specified complication of vascular prosthetic devices, implants and grafts, sequela: Secondary | ICD-10-CM

## 2021-10-09 DIAGNOSIS — N186 End stage renal disease: Secondary | ICD-10-CM

## 2021-10-13 ENCOUNTER — Encounter (INDEPENDENT_AMBULATORY_CARE_PROVIDER_SITE_OTHER): Payer: Self-pay | Admitting: Vascular Surgery

## 2021-10-13 ENCOUNTER — Ambulatory Visit (INDEPENDENT_AMBULATORY_CARE_PROVIDER_SITE_OTHER): Payer: Medicare HMO | Admitting: Vascular Surgery

## 2021-10-13 VITALS — BP 164/84 | HR 60 | Resp 16 | Wt 193.4 lb

## 2021-10-13 DIAGNOSIS — N186 End stage renal disease: Secondary | ICD-10-CM

## 2021-10-13 DIAGNOSIS — I10 Essential (primary) hypertension: Secondary | ICD-10-CM

## 2021-10-13 DIAGNOSIS — E1165 Type 2 diabetes mellitus with hyperglycemia: Secondary | ICD-10-CM

## 2021-10-13 DIAGNOSIS — Z794 Long term (current) use of insulin: Secondary | ICD-10-CM

## 2021-10-13 DIAGNOSIS — Z992 Dependence on renal dialysis: Secondary | ICD-10-CM

## 2021-10-13 NOTE — Progress Notes (Signed)
MRN : [id number]  [name]. is a 51 y.o. (03-28-1970) male who presents with chief complaint of  Chief Complaint  Patient presents with   Follow-up    Left arm swelling and check dialysis access  .  History of Present Illness: Patient returns today in follow up of his dialysis access.  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  They tried to use his fistula for dialysis last week but gave him a pretty significant infiltration and hematoma.  He is also disappointed in the aneurysmal remaining right arm AV fistula after ligation.  He is frustrated in general.  He had a duplex about a week and a half ago demonstrating a patent left brachiocephalic AV fistula.  This was placed about 2 months ago.  Current Outpatient Medications  Medication Sig Dispense Refill   acetaminophen (TYLENOL) 325 MG tablet Take 1-2 tablets (325-650 mg total) by mouth every 4 (four) hours as needed for mild pain.     amiodarone (PACERONE) 200 MG tablet Take 1 tablet (200 mg total) by mouth every morning. 90 tablet 3   amLODipine (NORVASC) 5 MG tablet Take 5 mg by mouth daily.     atorvastatin (LIPITOR) 80 MG tablet Take 1 tablet (80 mg total) by mouth daily. (Patient taking differently: Take 80 mg by mouth at bedtime.) 30 tablet 0   calcium acetate (PHOSLO) 667 MG capsule Take 2 capsules (1,334 mg total) by mouth 3 (three) times daily with meals. 180 capsule 0   carvedilol (COREG) 25 MG tablet Take 25 mg by mouth 2 (two) times daily.     cloNIDine (CATAPRES) 0.1 MG tablet Take 1 tablet (0.1 mg total) by mouth 3 (three) times daily as needed (systolic blood pressure (top number) is Greater than 200 or DBP (lower number)is Greater than 110). 30 tablet 3   clopidogrel (PLAVIX) 75 MG tablet Take 1 tablet (75 mg total) by mouth daily. 30 tablet 0   dorzolamide-timolol (COSOPT) 22.3-6.8 MG/ML  ophthalmic solution Place 1 drop into the right eye 2 (two) times daily.     doxycycline (VIBRAMYCIN) 100 MG capsule Take 1 capsule (100 mg total) by mouth 2 (two) times daily. 28 capsule 0   ELIQUIS 2.5 MG TABS tablet Take 2 tablets (5 mg total) by mouth 2 (two) times daily. 60 tablet 3   erythromycin ophthalmic ointment Place 1 Application into both eyes daily.     HYDROcodone-acetaminophen (NORCO/VICODIN) 5-325 MG tablet Take 2 tablets by mouth every 6 (six) hours as needed for moderate pain. 20 tablet 0   hydrocortisone 2.5 % cream Place rectally.     lactobacillus (FLORANEX/LACTINEX) PACK Take 1 packet (1 g total) by mouth 3 (three) times daily with meals. 90 packet 0   levETIRAcetam (KEPPRA) 250 MG tablet Take 1 tablet (250 mg total) by mouth Every Tuesday,Thursday,and Saturday with dialysis. 15 tablet 1   levETIRAcetam (KEPPRA) 500 MG tablet Take 1 tablet (500 mg total) by mouth 2 (two) times daily. 60 tablet 2   melatonin 5 MG TABS Take 1 tablet (5 mg total) by mouth at bedtime as needed. 30 tablet 0   mupirocin ointment (BACTROBAN) 2 % Apply 1 Application topically 2 (two) times daily. 22 g 0   neomycin-polymyxin b-dexamethasone (MAXITROL) 3.5-10000-0.1 OINT Place 1 application. into the left eye in the morning and at bedtime.     nitroGLYCERIN (  NITROSTAT) 0.4 MG SL tablet Place 1 tablet (0.4 mg total) under the tongue every 5 (five) minutes as needed for chest pain.     phenylephrine (,USE FOR PREPARATION-H,) 0.25 % suppository Place 1 suppository rectally 2 (two) times daily as needed for hemorrhoids. 12 suppository 0   prednisoLONE acetate (PRED FORTE) 1 % ophthalmic suspension Place 1 drop into the left eye every 4 (four) hours. 5 mL 0   PREPARATION H 0.25-88.44 % suppository Place rectally.     tamsulosin (FLOMAX) 0.4 MG CAPS capsule Take 0.4 mg by mouth daily.     witch hazel-glycerin (TUCKS) pad Apply topically as needed for itching. 40 each 12   clonazePAM (KLONOPIN) 0.5 MG  disintegrating tablet Take 1 tablet (0.5 mg total) by mouth daily as needed for seizure (for seizures). 10 tablet 0   No current facility-administered medications for this visit.    Past Medical History:  Diagnosis Date   Anxiety    a.) on BZO (clonazepam) PRN   Aortic dilatation ([place]) 11/25/2020   a.) TTE 11/25/2020: Ao root measured 38 mm. b.) TTE 04/22/2021: Ao root measured 44 mm.   Atrial fibrillation ([place])    a.) CHA2DS2-VASc = 6 (CHF, HTN, CVA x2, prior MI, T2DM). b.) rate/rhythm maintained on oral amiodarone + carvedilol; chronically anticoagulated using dose reduced apixaban + clopidogrel   BPH (benign prostatic hyperplasia)    Cardiac arrest ([place]) 07/26/2021   a.) in setting of hyperkalemia, NSTEMI, and seizure following missed HD; pulseless and apneic --> required 1 round of CPR prior to ROSC   Cellulitis    CHF (congestive heart failure) ([place]) 07/19/2017   a.) TTE 07/19/2017: EF 75%; sev LVH; mild MR/PR/TR; G1DD. b.) TTE 11/25/2020: EF 55-60%; mod LVH; GLS -13.0; mild-mod BAE; triv MR; mild dilated PA; G2DD. c.) TTE 04/22/2021: EF 55-60%; sev LVH; sev LAE, triv MR; G1DD.   Chronic diarrhea    Chronic pain of both ankles    Coronary artery disease 02/26/2020   a.) anterior STEMI 02/26/2020 --> LHC/PCI --> EF 65%; 90% mLAD (3.5 x 30 mm Resolyte Onyx DES x 1). b.) NSTEMI 07/26/2021   Diabetes mellitus, type II ([place])    Diabetic neuropathy (H[facility])    Esophagitis    ESRD (end stage renal disease) on dialysis ([place])    a.) [facility]; T-Th-Sat   Frequent falls    Gait instability    Gastritis    Gastroparesis    GERD (gastroesophageal reflux disease)    H/O enucleation of left eyeball    Heart palpitations    History of kidney stones    HLD (hyperlipidemia)    Hypertension    Insomnia    a.) uses melatonin PRN   Long term current use of anticoagulant    a.) dose reduced apixaban   Long term current use of antithrombotics/antiplatelets    a.) clopidogrel   Nausea and  vomiting in adult    recurrent   NSTEMI (non-ST elevated myocardial infarction) ([place]) 07/26/2021   a.) hyperkalemic from missed HD; seizures; decompensated to cardiac arrest and required CPR x 1 round prior to ROSC.   Seizure ([place])    a.) last 07/26/2021 in setting of missed HD --> hyperkalemic at 6.5 --> pulseless/apneic and required CPR; discharged home on levetiracetam.   ST elevation myocardial infarction (STEMI) of anterior wall ([place]) 02/26/2020   a.) LHC/PCI 02/26/2020 --> EF 65%; LVEDP 11 mmHg; 90% mLAD (3.5 x 20 mm Resolute Onyx DES x 1)   Stroke ([place]) 04/2021  a.) documented as x 3 between 04/2021 and 06/2021    Past Surgical History:  Procedure Laterality Date   A/V FISTULAGRAM Left 08/31/2021   Procedure: A/V Fistulagram;  Surgeon: Algernon Huxley, MD;  Location: [place] [facility];  Service: Cardiovascular;  Laterality: Left;   AMPUTATION Left 03/16/2016   Procedure: AMPUTATION DIGIT LEFT HALLUX;  Surgeon: Trula Slade, DPM;  Location: [place];  Service: Podiatry;  Laterality: Left;  can start around 5    AMPUTATION Right 02/09/2021   Procedure: AMPUTATION DIGIT;  Surgeon: Sherilyn Cooter, MD;  Location: [place];  Service: Orthopedics;  Laterality: Right;   ARTHROSCOPIC REPAIR ACL Left    AV FISTULA PLACEMENT Right 05/31/2019   Procedure: Brachiocephalic AV fistula creation;  Surgeon: Algernon Huxley, MD;  Location: [name];  Service: Vascular;  Laterality: Right;   AV FISTULA PLACEMENT Left 08/13/2021   Procedure: ARTERIOVENOUS (AV) FISTULA CREATION (BRACHIALCEPHALIC);  Surgeon: Algernon Huxley, MD;  Location: [name];  Service: Vascular;  Laterality: Left;   COLONOSCOPY WITH PROPOFOL N/A 10/28/2015   Procedure: COLONOSCOPY WITH PROPOFOL;  Surgeon: Lollie Sails, MD;  Location: [facility];  Service: Endoscopy;  Laterality: N/A;   COLONOSCOPY WITH PROPOFOL N/A 10/29/2015   Procedure: COLONOSCOPY WITH PROPOFOL;  Surgeon: Lollie Sails, MD;  Location: [facility];   Service: Endoscopy;  Laterality: N/A;   CORONARY/GRAFT ACUTE MI REVASCULARIZATION N/A 02/26/2020   Procedure: Coronary/Graft Acute MI Revascularization;  Surgeon: Jettie Booze, MD;  Location: [place] [facility];  Service: Cardiovascular;  Laterality: N/A;   DIALYSIS/PERMA CATHETER INSERTION Right 04/26/2019   Perm Cath    DIALYSIS/PERMA CATHETER INSERTION N/A 04/26/2019   Procedure: DIALYSIS/PERMA CATHETER INSERTION;  Surgeon: Algernon Huxley, MD;  Location: [place] [facility];  Service: Cardiovascular;  Laterality: N/A;   DIALYSIS/PERMA CATHETER INSERTION N/A 05/25/2021   Procedure: DIALYSIS/PERMA CATHETER INSERTION;  Surgeon: Algernon Huxley, MD;  Location: [place] [facility];  Service: Cardiovascular;  Laterality: N/A;   DIALYSIS/PERMA CATHETER INSERTION N/A 08/31/2021   Procedure: DIALYSIS/PERMA CATHETER INSERTION;  Surgeon: Algernon Huxley, MD;  Location: [place] [facility];  Service: Cardiovascular;  Laterality: N/A;   DIALYSIS/PERMA CATHETER REMOVAL N/A 08/15/2019   Procedure: DIALYSIS/PERMA CATHETER REMOVAL;  Surgeon: Katha Cabal, MD;  Location: [place] [facility];  Service: Cardiovascular;  Laterality: N/A;   EMBOLIZATION Right 02/06/2021   Procedure: EMBOLIZATION;  Surgeon: Algernon Huxley, MD;  Location: [place] [facility];  Service: Cardiovascular;  Laterality: Right;  Right Upper Extremity Dialysis Access, Permcath Placement   ESOPHAGOGASTRODUODENOSCOPY (EGD) WITH PROPOFOL N/A 12/27/2017   Procedure: ESOPHAGOGASTRODUODENOSCOPY (EGD) WITH PROPOFOL;  Surgeon: [place], Benay Pike, MD;  Location: [name]OSCOPY;  Service: Gastroenterology;  Laterality: N/A;   EYE SURGERY     INTRAVASCULAR ULTRASOUND/IVUS N/A 02/26/2020   Procedure: Intravascular Ultrasound/IVUS;  Surgeon: Jettie Booze, MD;  Location: [place] [facility];  Service: Cardiovascular;  Laterality: N/A;   LEFT HEART CATH AND CORONARY ANGIOGRAPHY N/A 02/26/2020   Procedure: LEFT HEART CATH AND CORONARY  ANGIOGRAPHY;  Surgeon: Jettie Booze, MD;  Location: [place] [facility];  Service: Cardiovascular;  Laterality: N/A;   PROSTATE SURGERY  2016   TONSILECTOMY/ADENOIDECTOMY WITH MYRINGOTOMY     TONSILLECTOMY     UPPER EXTREMITY ANGIOGRAPHY Right 11/26/2020   Procedure: Upper Extremity Angiography;  Surgeon: Algernon Huxley, MD;  Location: [place] [facility];  Service: Cardiovascular;  Laterality: Right;   UPPER EXTREMITY ANGIOGRAPHY Right 02/05/2021  Procedure: UPPER EXTREMITY ANGIOGRAPHY;  Surgeon: Algernon Huxley, MD;  Location: [place] [facility];  Service: Cardiovascular;  Laterality: Right;     Social History   Tobacco Use   Smoking status: Never   Smokeless tobacco: Never  Vaping Use   Vaping Use: Never used  Substance Use Topics   Alcohol use: No   Drug use: No       Family History  Problem Relation Age of Onset   CAD Father    Stroke Father    Diabetes Mellitus II Mother    Kidney failure Mother    Schizophrenia Mother      Allergies  Allergen Reactions   Promethazine Diarrhea and Other (See Comments)    Muscle cramps   Promethazine Hcl Other (See Comments)    Cramping all over  Other reaction(s): cramp all over   Other     Other reaction(s): Unknown Other reaction(s): Unknown   Malt Other (See Comments)    Cramping     REVIEW OF SYSTEMS (Negative unless checked)  Constitutional: [] Weight loss  [] Fever  [] Chills Cardiac: [] Chest pain   [] Chest pressure   [] Palpitations   [] Shortness of breath when laying flat   [] Shortness of breath at rest   [] Shortness of breath with exertion. Vascular:  [] Pain in legs with walking   [] Pain in legs at rest   [] Pain in legs when laying flat   [] Claudication   [] Pain in feet when walking  [] Pain in feet at rest  [] Pain in feet when laying flat   [] History of DVT   [] Phlebitis   [x] Swelling in legs   [] Varicose veins   [] Non-healing ulcers Pulmonary:   [] Uses home oxygen   [] Productive cough   [] Hemoptysis   [] Wheeze   [] COPD   [] Asthma Neurologic:  [] Dizziness  [] Blackouts   [x] Seizures   [] History of stroke   [] History of TIA  [] Aphasia   [] Temporary blindness   [] Dysphagia   [] Weakness or numbness in arms   [] Weakness or numbness in legs Musculoskeletal:  [x] Arthritis   [] Joint swelling   [] Joint pain   [] Low back pain Hematologic:  [] Easy bruising  [] Easy bleeding   [] Hypercoagulable state   [x] Anemic   Gastrointestinal:  [] Blood in stool   [] Vomiting blood  [] Gastroesophageal reflux/heartburn   [] Abdominal pain Genitourinary:  [x] Chronic kidney disease   [] Difficult urination  [] Frequent urination  [] Burning with urination   [] Hematuria Skin:  [] Rashes   [] Ulcers   [] Wounds Psychological:  [x] History of anxiety   []  History of major depression.  Physical Examination  BP (!) 164/84 (BP Location: Right Arm)   Pulse 60   Resp 16   Wt 193 lb 6.4 oz (87.7 kg)   BMI 24.17 kg/m  Gen:  WD/WN, NAD Head: [place]/AT, No temporalis wasting. Neck: Supple.  Trachea midline Pulmonary:  Good air movement, no use of accessory muscles.  Cardiac: RRR, no JVD Vascular: Excellent thrill is present in the left brachiocephalic AV fistula.  1-2+ left upper extremity swelling Vessel Right Left  Radial Palpable Palpable               Musculoskeletal: M/S 5/5 throughout.  No deformity or atrophy. Neurologic: Sensation grossly intact in extremities.  Symmetrical.  Speech is fluent.  Psychiatric: Judgment intact, Mood & affect appropriate for pt's clinical situation. Dermatologic: No rashes or ulcers noted.  No cellulitis or open wounds.      Labs Recent Results (from the past 2160 hour(s))  CBG monitoring, ED  Status: None   Collection Time: 07/26/21 11:03 AM  Result Value Ref Range   Glucose-Capillary 73 70 - 99 mg/dL    Comment: Glucose reference range applies only to samples taken after fasting for at least 8 hours.  Comprehensive metabolic panel     Status: Abnormal   Collection Time: 07/26/21  2:15 PM   Result Value Ref Range   Sodium 139 135 - 145 mmol/L   Potassium 6.5 (HH) 3.5 - 5.1 mmol/L    Comment: CRITICAL RESULT CALLED TO, READ BACK BY AND VERIFIED WITH: TO Deno Etienne @1550  ON 07/26/21 BY GMCGEEHON.    Chloride 108 98 - 111 mmol/L   CO2 18 (L) 22 - 32 mmol/L   Glucose, Bld 70 70 - 99 mg/dL    Comment: Glucose reference range applies only to samples taken after fasting for at least 8 hours.   BUN 79 (H) 6 - 20 mg/dL   Creatinine, Ser 11.07 (H) 0.61 - 1.24 mg/dL   Calcium 8.4 (L) 8.9 - 10.3 mg/dL   Total Protein 6.5 6.5 - 8.1 g/dL   Albumin 3.2 (L) 3.5 - 5.0 g/dL   AST 12 (L) 15 - 41 U/L   ALT 18 0 - 44 U/L   Alkaline Phosphatase 98 38 - 126 U/L   Total Bilirubin 0.6 0.3 - 1.2 mg/dL   GFR, Estimated 5 (L) >60 mL/min    Comment: (NOTE) Calculated using the CKD-EPI Creatinine Equation (2021)    Anion gap 13 5 - 15    Comment: Performed at [facility], [name] [place], [place] [address]  CBC with Differential     Status: Abnormal   Collection Time: 07/26/21  2:15 PM  Result Value Ref Range   WBC 8.4 4.0 - 10.5 K/uL   RBC 4.61 4.22 - 5.81 MIL/uL   Hemoglobin 10.0 (L) 13.0 - 17.0 g/dL   HCT 33.7 (L) 39.0 - 52.0 %   MCV 73.1 (L) 80.0 - 100.0 fL   MCH 21.7 (L) 26.0 - 34.0 pg   MCHC 29.7 (L) 30.0 - 36.0 g/dL   RDW 22.5 (H) 11.5 - 15.5 %   Platelets 153 150 - 400 K/uL   nRBC 0.0 0.0 - 0.2 %   Neutrophils Relative % 65 %   Neutro Abs 5.5 1.7 - 7.7 K/uL   Lymphocytes Relative 17 %   Lymphs Abs 1.4 0.7 - 4.0 K/uL   Monocytes Relative 10 %   Monocytes Absolute 0.9 0.1 - 1.0 K/uL   Eosinophils Relative 7 %   Eosinophils Absolute 0.6 (H) 0.0 - 0.5 K/uL   Basophils Relative 1 %   Basophils Absolute 0.1 0.0 - 0.1 K/uL   Immature Granulocytes 0 %   Abs Immature Granulocytes 0.02 0.00 - 0.07 K/uL    Comment: Performed at [facility], [address] [place], [place] [address]  Magnesium     Status: None   Collection Time: 07/26/21  2:15 PM  Result Value Ref  Range   Magnesium 1.9 1.7 - 2.4 mg/dL    Comment: Performed at [facility], [address] [place], [place] [address]  Ethanol     Status: None   Collection Time: 07/26/21  2:15 PM  Result Value Ref Range   Alcohol, Ethyl (B) <10 <10 mg/dL    Comment: (NOTE) Lowest detectable limit for serum alcohol is 10 mg/dL.  For medical purposes only. Performed at [facility], [address] [place], [place] [address]   Brain natriuretic  peptide     Status: Abnormal   Collection Time: 07/26/21  2:15 PM  Result Value Ref Range   B Natriuretic Peptide 1,785.0 (H) 0.0 - 100.0 pg/mL    Comment: Performed at [facility], [address] [place], [place] [address]  Potassium     Status: Abnormal   Collection Time: 07/26/21  4:38 PM  Result Value Ref Range   Potassium 6.3 (HH) 3.5 - 5.1 mmol/L    Comment: CRITICAL RESULT CALLED TO, READ BACK BY AND VERIFIED WITH: TO ANDREA FRASIER @1809  ON 07/26/21 BY GMCGEEHON. Performed at [facility], [address] [place], [place] [address]   POC CBG, ED     Status: None   Collection Time: 07/26/21  4:57 PM  Result Value Ref Range   Glucose-Capillary 72 70 - 99 mg/dL    Comment: Glucose reference range applies only to samples taken after fasting for at least 8 hours.  Hepatitis B surface antigen     Status: None   Collection Time: 07/26/21  6:40 PM  Result Value Ref Range   Hepatitis B Surface Ag NON REACTIVE NON REACTIVE    Comment: Performed at [facility] [address], [place], [place] [address]  Hepatitis B surface antibody     Status: Abnormal   Collection Time: 07/26/21  6:40 PM  Result Value Ref Range   Hep B S Ab Reactive (A) NON REACTIVE    Comment: (NOTE) Consistent with immunity, greater than 9.9 mIU/mL.  Performed at [facility], [place] [address] [place], [place] [address]   Hepatitis B surface antibody,quantitative     Status: None   Collection Time: 07/26/21  6:40 PM  Result Value Ref Range   Hep B S AB Quant (Post)  312.8 Immunity>9.9 mIU/mL    Comment: (NOTE)  Status of Immunity                     Anti-HBs Level  ------------------                     -------------- Inconsistent with Immunity                   0.0 - 9.9 Consistent with Immunity                          >9.9 Performed At: [facility] [place], [place] [id number] Rush Farmer MD AQ:[id number]   Hepatitis B core antibody, total     Status: None   Collection Time: 07/26/21  6:40 PM  Result Value Ref Range   Hep B Core Total Ab NON REACTIVE NON REACTIVE    Comment: Performed at [facility] [address] [place], [place] [address]  Hepatitis C antibody     Status: None   Collection Time: 07/26/21  6:40 PM  Result Value Ref Range   HCV Ab NON REACTIVE NON REACTIVE    Comment: (NOTE) Nonreactive HCV antibody screen is consistent with no HCV infections,  unless recent infection is suspected or other evidence exists to indicate HCV infection.  Performed at [facility], [place] [address], [place], [place] [address]   Potassium     Status: Abnormal   Collection Time: 07/26/21  7:34 PM  Result Value Ref Range   Potassium 6.0 (H) 3.5 - 5.1 mmol/L    Comment: Performed at [facility] [place]  [address], [place], [place] [address]  Potassium     Status: Abnormal   Collection Time: 07/26/21 11:58 PM  Result Value Ref Range   Potassium 6.0 (H) 3.5 - 5.1 mmol/L    Comment: Performed at [facility], [address], [place], [place] [address]  Renal function panel     Status: Abnormal   Collection Time: 07/27/21  4:36 AM  Result Value Ref Range   Sodium 137 135 - 145 mmol/L   Potassium 3.9 3.5 - 5.1 mmol/L    Comment: DELTA CHECK NOTED   Chloride 100 98 - 111 mmol/L   CO2 24 22 - 32 mmol/L   Glucose, Bld 73 70 - 99 mg/dL    Comment: Glucose reference range applies only to samples taken after fasting for at least 8 hours.   BUN 49 (H) 6 - 20 mg/dL   Creatinine, Ser 7.28 (H) 0.61 - 1.24 mg/dL    Calcium 8.4 (L) 8.9 - 10.3 mg/dL   Phosphorus 7.5 (H) 2.5 - 4.6 mg/dL   Albumin 3.0 (L) 3.5 - 5.0 g/dL   GFR, Estimated 8 (L) >60 mL/min    Comment: (NOTE) Calculated using the CKD-EPI Creatinine Equation (2021)    Anion gap 13 5 - 15    Comment: Performed at [facility], [address] [place], [place] [address]  CBC     Status: Abnormal   Collection Time: 07/27/21  4:36 AM  Result Value Ref Range   WBC 16.9 (H) 4.0 - 10.5 K/uL   RBC 4.73 4.22 - 5.81 MIL/uL   Hemoglobin 10.4 (L) 13.0 - 17.0 g/dL   HCT 33.8 (L) 39.0 - 52.0 %   MCV 71.5 (L) 80.0 - 100.0 fL   MCH 22.0 (L) 26.0 - 34.0 pg   MCHC 30.8 30.0 - 36.0 g/dL   RDW 22.3 (H) 11.5 - 15.5 %   Platelets 152 150 - 400 K/uL   nRBC 0.0 0.0 - 0.2 %    Comment: Performed at [facility], [address], [place], [place] [address]  CK     Status: None   Collection Time: 07/27/21  4:36 AM  Result Value Ref Range   Total CK 196 49 - 397 U/L    Comment: Performed at [facility], [address] [place], [place] [address]  Glucose, capillary     Status: None   Collection Time: 07/27/21  7:42 AM  Result Value Ref Range   Glucose-Capillary 81 70 - 99 mg/dL    Comment: Glucose reference range applies only to samples taken after fasting for at least 8 hours.  Glucose, capillary     Status: None   Collection Time: 07/27/21 11:22 AM  Result Value Ref Range   Glucose-Capillary 80 70 - 99 mg/dL    Comment: Glucose reference range applies only to samples taken after fasting for at least 8 hours.  Glucose, capillary     Status: None   Collection Time: 07/27/21  4:47 PM  Result Value Ref Range   Glucose-Capillary 88 70 - 99 mg/dL    Comment: Glucose reference range applies only to samples taken after fasting for at least 8 hours.   Comment 1 Notify RN    Comment 2 Document in Chart   Glucose, capillary     Status: None   Collection Time: 07/27/21  9:54 PM  Result Value Ref Range   Glucose-Capillary 87 70 - 99 mg/dL    Comment: Glucose  reference range applies only to samples taken  after fasting for at least 8 hours.  Basic metabolic panel     Status: Abnormal   Collection Time: 07/28/21  3:07 AM  Result Value Ref Range   Sodium 137 135 - 145 mmol/L   Potassium 5.2 (H) 3.5 - 5.1 mmol/L   Chloride 101 98 - 111 mmol/L   CO2 24 22 - 32 mmol/L   Glucose, Bld 69 (L) 70 - 99 mg/dL    Comment: Glucose reference range applies only to samples taken after fasting for at least 8 hours.   BUN 64 (H) 6 - 20 mg/dL   Creatinine, Ser 9.68 (H) 0.61 - 1.24 mg/dL   Calcium 8.2 (L) 8.9 - 10.3 mg/dL   GFR, Estimated 6 (L) >60 mL/min    Comment: (NOTE) Calculated using the CKD-EPI Creatinine Equation (2021)    Anion gap 12 5 - 15    Comment: Performed at [facility] [address] [place], [place] [address]  CBC     Status: Abnormal   Collection Time: 07/28/21  3:07 AM  Result Value Ref Range   WBC 14.5 (H) 4.0 - 10.5 K/uL   RBC 3.96 (L) 4.22 - 5.81 MIL/uL   Hemoglobin 8.6 (L) 13.0 - 17.0 g/dL    Comment: Reticulocyte Hemoglobin testing may be clinically indicated, consider ordering this additional test [id number]    HCT 28.7 (L) 39.0 - 52.0 %   MCV 72.5 (L) 80.0 - 100.0 fL   MCH 21.7 (L) 26.0 - 34.0 pg   MCHC 30.0 30.0 - 36.0 g/dL   RDW 21.3 (H) 11.5 - 15.5 %   Platelets 113 (L) 150 - 400 K/uL    Comment: REPEATED TO VERIFY PLATELET COUNT CONFIRMED BY SMEAR    nRBC 0.0 0.0 - 0.2 %    Comment: Performed at [facility], [place] [address] [place], [place] [address]  Glucose, capillary     Status: Abnormal   Collection Time: 07/28/21  6:25 AM  Result Value Ref Range   Glucose-Capillary 60 (L) 70 - 99 mg/dL    Comment: Glucose reference range applies only to samples taken after fasting for at least 8 hours.   Comment 1 Notify RN   Glucose, capillary     Status: None   Collection Time: 07/28/21  6:49 AM  Result Value Ref Range   Glucose-Capillary 73 70 - 99 mg/dL    Comment: Glucose reference range applies only  to samples taken after fasting for at least 8 hours.  Glucose, capillary     Status: None   Collection Time: 07/28/21  6:04 PM  Result Value Ref Range   Glucose-Capillary 75 70 - 99 mg/dL    Comment: Glucose reference range applies only to samples taken after fasting for at least 8 hours.   Comment 1 Notify RN    Comment 2 Document in Chart   Basic metabolic panel     Status: Abnormal   Collection Time: 07/29/21  5:24 AM  Result Value Ref Range   Sodium 134 (L) 135 - 145 mmol/L   Potassium 4.0 3.5 - 5.1 mmol/L   Chloride 94 (L) 98 - 111 mmol/L   CO2 26 22 - 32 mmol/L   Glucose, Bld 90 70 - 99 mg/dL    Comment: Glucose reference range applies only to samples taken after fasting for at least 8 hours.   BUN 42 (H) 6 - 20 mg/dL   Creatinine, Ser 7.29 (H) 0.61 - 1.24 mg/dL  Calcium 8.6 (L) 8.9 - 10.3 mg/dL   GFR, Estimated 8 (L) >60 mL/min    Comment: (NOTE) Calculated using the CKD-EPI Creatinine Equation (2021)    Anion gap 14 5 - 15    Comment: Performed at [facility], [place] [address] [place], [place] [address]  CBC     Status: Abnormal   Collection Time: 07/29/21  5:24 AM  Result Value Ref Range   WBC 9.9 4.0 - 10.5 K/uL   RBC 3.88 (L) 4.22 - 5.81 MIL/uL   Hemoglobin 8.4 (L) 13.0 - 17.0 g/dL    Comment: Reticulocyte Hemoglobin testing may be clinically indicated, consider ordering this additional test [id number]    HCT 27.8 (L) 39.0 - 52.0 %   MCV 71.6 (L) 80.0 - 100.0 fL   MCH 21.6 (L) 26.0 - 34.0 pg   MCHC 30.2 30.0 - 36.0 g/dL   RDW 21.2 (H) 11.5 - 15.5 %   Platelets 111 (L) 150 - 400 K/uL   nRBC 0.0 0.0 - 0.2 %    Comment: Performed at [facility], [place] [address] [place], [place] [address]  Magnesium     Status: None   Collection Time: 07/29/21  5:24 AM  Result Value Ref Range   Magnesium 1.7 1.7 - 2.4 mg/dL    Comment: Performed at [place] [address], [place],  [address]  Glucose, capillary     Status: Abnormal   Collection  Time: 07/29/21  8:18 AM  Result Value Ref Range   Glucose-Capillary 125 (H) 70 - 99 mg/dL    Comment: Glucose reference range applies only to samples taken after fasting for at least 8 hours.   Comment 1 Notify RN    Comment 2 Document in Chart   Glucose, capillary     Status: None   Collection Time: 07/29/21  9:15 PM  Result Value Ref Range   Glucose-Capillary 72 70 - 99 mg/dL    Comment: Glucose reference range applies only to samples taken after fasting for at least 8 hours.   Comment 1 Notify RN    Comment 2 Document in Chart   Glucose, capillary     Status: None   Collection Time: 07/29/21 10:17 PM  Result Value Ref Range   Glucose-Capillary 84 70 - 99 mg/dL    Comment: Glucose reference range applies only to samples taken after fasting for at least 8 hours.   Comment 1 Notify RN    Comment 2 Document in Chart   Glucose, capillary     Status: Abnormal   Collection Time: 07/30/21  5:14 AM  Result Value Ref Range   Glucose-Capillary 69 (L) 70 - 99 mg/dL    Comment: Glucose reference range applies only to samples taken after fasting for at least 8 hours.   Comment 1 Notify RN    Comment 2 Document in Chart   Glucose, capillary     Status: None   Collection Time: 07/30/21  5:51 AM  Result Value Ref Range   Glucose-Capillary 82 70 - 99 mg/dL    Comment: Glucose reference range applies only to samples taken after fasting for at least 8 hours.  Glucose, capillary     Status: None   Collection Time: 07/30/21  8:18 AM  Result Value Ref Range   Glucose-Capillary 87 70 - 99 mg/dL    Comment: Glucose reference range applies only to samples taken after fasting for at least 8 hours.  Vitamin  B12     Status: Abnormal   Collection Time: 07/30/21  8:48 AM  Result Value Ref Range   Vitamin B-12 1,986 (H) 180 - 914 pg/mL    Comment: RESULT CONFIRMED BY AUTOMATED DILUTION (NOTE) This assay is not validated for testing neonatal or myeloproliferative syndrome specimens for Vitamin B12  levels. Performed at [facility], [place] [address], [place], [place] [address]   Iron and TIBC     Status: Abnormal   Collection Time: 07/30/21  8:48 AM  Result Value Ref Range   Iron 17 (L) 45 - 182 ug/dL   TIBC 133 (L) 250 - 450 ug/dL   Saturation Ratios 13 (L) 17.9 - 39.5 %   UIBC 116 ug/dL    Comment: Performed at [facility], [place] [address] [place], [place] [address]  Ferritin     Status: Abnormal   Collection Time: 07/30/21  8:48 AM  Result Value Ref Range   Ferritin 656 (H) 24 - 336 ng/mL    Comment: Performed at [facility], [place] [address] [place], [place] [address]  Renal function panel     Status: Abnormal   Collection Time: 07/30/21  8:48 AM  Result Value Ref Range   Sodium 135 135 - 145 mmol/L   Potassium 4.7 3.5 - 5.1 mmol/L   Chloride 95 (L) 98 - 111 mmol/L   CO2 25 22 - 32 mmol/L   Glucose, Bld 81 70 - 99 mg/dL    Comment: Glucose reference range applies only to samples taken after fasting for at least 8 hours.   BUN 60 (H) 6 - 20 mg/dL   Creatinine, Ser 9.77 (H) 0.61 - 1.24 mg/dL   Calcium 8.5 (L) 8.9 - 10.3 mg/dL   Phosphorus 8.9 (H) 2.5 - 4.6 mg/dL   Albumin 2.4 (L) 3.5 - 5.0 g/dL   GFR, Estimated 6 (L) >60 mL/min    Comment: (NOTE) Calculated using the CKD-EPI Creatinine Equation (2021)    Anion gap 15 5 - 15    Comment: Performed at [place] [address], [place], [place] [address]  CBC     Status: Abnormal   Collection Time: 07/30/21  8:48 AM  Result Value Ref Range   WBC 8.4 4.0 - 10.5 K/uL   RBC 3.92 (L) 4.22 - 5.81 MIL/uL   Hemoglobin 8.4 (L) 13.0 - 17.0 g/dL    Comment: Reticulocyte Hemoglobin testing may be clinically indicated, consider ordering this additional test [id number]    HCT 28.8 (L) 39.0 - 52.0 %   MCV 73.5 (L) 80.0 - 100.0 fL   MCH 21.4 (L) 26.0 - 34.0 pg   MCHC 29.2 (L) 30.0 - 36.0 g/dL   RDW 21.2 (H) 11.5 - 15.5 %   Platelets 132 (L) 150 - 400 K/uL   nRBC 0.0 0.0 - 0.2 %    Comment:  Performed at [facility], [place] [address] [place], [place] [address]  Reticulocytes     Status: Abnormal   Collection Time: 07/30/21  8:48 AM  Result Value Ref Range   Retic Ct Pct 0.9 0.4 - 3.1 %   RBC. 3.92 (L) 4.22 - 5.81 MIL/uL   Retic Count, Absolute 34.9 19.0 - 186.0 K/uL   Immature Retic Fract 17.3 (H) 2.3 - 15.9 %    Comment: Performed at [place] [address], [place], [place] [address]  Folate     Status: None  Collection Time: 07/30/21  8:48 AM  Result Value Ref Range   Folate 6.1 >5.9 ng/mL    Comment: Performed at [facility], [place] [address] [place], [place] [address]  Glucose, capillary     Status: Abnormal   Collection Time: 07/30/21  4:52 PM  Result Value Ref Range   Glucose-Capillary 68 (L) 70 - 99 mg/dL    Comment: Glucose reference range applies only to samples taken after fasting for at least 8 hours.  Protime-INR     Status: Abnormal   Collection Time: 08/12/21  8:56 AM  Result Value Ref Range   Prothrombin Time 15.9 (H) 11.4 - 15.2 seconds   INR 1.3 (H) 0.8 - 1.2    Comment: (NOTE) INR goal varies based on device and disease states. Performed at [facility], [place] [place], [place] [address]   APTT     Status: Abnormal   Collection Time: 08/12/21  8:56 AM  Result Value Ref Range   aPTT 42 (H) 24 - 36 seconds    Comment:        IF BASELINE aPTT IS ELEVATED, SUGGEST PATIENT RISK ASSESSMENT BE USED TO DETERMINE APPROPRIATE ANTICOAGULANT THERAPY. Performed at [facility], [place] [place], [place] [address]   Type and screen [place]     Status: None   Collection Time: 08/12/21  8:56 AM  Result Value Ref Range   ABO/RH(D) A POS    Antibody Screen NEG    Sample Expiration 08/26/2021,2359    Extend sample reason      NO TRANSFUSIONS OR PREGNANCY IN THE PAST 3 MONTHS Performed at [facility], [place] [place], [place] [address]   I-STAT, [place] 8      Status: Abnormal   Collection Time: 08/13/21 11:56 AM  Result Value Ref Range   Sodium 142 135 - 145 mmol/L   Potassium 4.2 3.5 - 5.1 mmol/L   Chloride 110 98 - 111 mmol/L   BUN 55 (H) 6 - 20 mg/dL   Creatinine, Ser 12.30 (H) 0.61 - 1.24 mg/dL   Glucose, Bld 81 70 - 99 mg/dL    Comment: Glucose reference range applies only to samples taken after fasting for at least 8 hours.   Calcium, Ion 1.14 (L) 1.15 - 1.40 mmol/L   TCO2 21 (L) 22 - 32 mmol/L   Hemoglobin 9.9 (L) 13.0 - 17.0 g/dL   HCT 29.0 (L) 39.0 - 52.0 %  Glucose, capillary     Status: None   Collection Time: 08/13/21  2:17 PM  Result Value Ref Range   Glucose-Capillary 77 70 - 99 mg/dL    Comment: Glucose reference range applies only to samples taken after fasting for at least 8 hours.  Potassium [facility] vascular lab only)     Status: None   Collection Time: 08/31/21 12:04 PM  Result Value Ref Range   Potassium [facility] vascular lab) 3.7 3.5 - 5.1 mmol/L    Comment: Performed at [facility], [place] [place], [place] [address]  Comprehensive metabolic panel     Status: Abnormal   Collection Time: 09/04/21  1:40 PM  Result Value Ref Range   Sodium 142 135 - 145 mmol/L   Potassium 3.3 (L) 3.5 - 5.1 mmol/L   Chloride 108 98 - 111 mmol/L   CO2 21 (L) 22 - 32 mmol/L   Glucose, Bld 133 (H) 70 - 99 mg/dL    Comment: Glucose  reference range applies only to samples taken after fasting for at least 8 hours.   BUN 51 (H) 6 - 20 mg/dL   Creatinine, Ser 10.67 (H) 0.61 - 1.24 mg/dL   Calcium 8.3 (L) 8.9 - 10.3 mg/dL   Total Protein 6.5 6.5 - 8.1 g/dL   Albumin 2.8 (L) 3.5 - 5.0 g/dL   AST 9 (L) 15 - 41 U/L   ALT <5 0 - 44 U/L   Alkaline Phosphatase 77 38 - 126 U/L   Total Bilirubin 0.3 0.3 - 1.2 mg/dL   GFR, Estimated 5 (L) >60 mL/min    Comment: (NOTE) Calculated using the CKD-EPI Creatinine Equation (2021)    Anion gap 13 5 - 15    Comment: Performed at [facility], [address], [place], [place] [address]   CBC with Differential     Status: Abnormal   Collection Time: 09/04/21  1:40 PM  Result Value Ref Range   WBC 6.7 4.0 - 10.5 K/uL   RBC 3.40 (L) 4.22 - 5.81 MIL/uL   Hemoglobin 7.3 (L) 13.0 - 17.0 g/dL    Comment: Reticulocyte Hemoglobin testing may be clinically indicated, consider ordering this additional test [id number]    HCT 24.5 (L) 39.0 - 52.0 %   MCV 72.1 (L) 80.0 - 100.0 fL   MCH 21.5 (L) 26.0 - 34.0 pg   MCHC 29.8 (L) 30.0 - 36.0 g/dL   RDW 23.5 (H) 11.5 - 15.5 %   Platelets 188 150 - 400 K/uL   nRBC 0.0 0.0 - 0.2 %   Neutrophils Relative % 66 %   Neutro Abs 4.4 1.7 - 7.7 K/uL   Lymphocytes Relative 15 %   Lymphs Abs 1.0 0.7 - 4.0 K/uL   Monocytes Relative 13 %   Monocytes Absolute 0.9 0.1 - 1.0 K/uL   Eosinophils Relative 5 %   Eosinophils Absolute 0.3 0.0 - 0.5 K/uL   Basophils Relative 1 %   Basophils Absolute 0.1 0.0 - 0.1 K/uL   WBC Morphology MORPHOLOGY UNREMARKABLE    Smear Review MORPHOLOGY UNREMARKABLE    Immature Granulocytes 0 %   Abs Immature Granulocytes 0.03 0.00 - 0.07 K/uL   Target Cells PRESENT    Spherocytes PRESENT     Comment: Performed at [facility], [address] [place] [address], [place], [place] [address]  Type and screen [facility]     Status: None   Collection Time: 09/04/21  2:00 PM  Result Value Ref Range   ABO/RH(D) A POS    Antibody Screen NEG    Sample Expiration      09/07/2021,2359 Performed at [facility], [address], [place], [place] [address]     Radiology VAS [place] (AVF,AVG)  Result Date: 10/05/2021 DIALYSIS ACCESS Patient Name:  Merrick Maggio.  Date of Exam:   10/02/2021 Medical Rec #: [id number]             Accession #:    [id number] Date of Birth: [birth year]              Patient Gender: M Patient Age:   58 years Exam Location:  [facility] Procedure:      VAS US DUPLEX DIALYSIS ACCESS (AVF, AVG) Referring Phys: [name]  --------------------------------------------------------------------------------  Access Site: Left Upper Extremity. Access Type: Brachial-cephalic AVF. History: 08/13/21: Left brachial-cephalic AVF creation;           08/31/2021: Left Arm Fistulagram including central Venogram. PTA of  Left innominate vein with a 12 mm diameter angioplasty balloon. [place]          guidance for Vascular access Right Jugular vein. Fuoroscopic guidance          for placement of catheter. Placement of 19 cm tip to cuff tunneled          hemodialysis catheter Right Jugular vein. Removal of Left Jugular          dialysis catheter. Comparison Study: 08/27/2021 Performing Technologist: Almira Coaster RVS  Examination Guidelines: A complete evaluation includes B-mode imaging, spectral Doppler, color Doppler, and power Doppler as needed of all accessible portions of each vessel. Unilateral testing is considered an integral part of a complete examination. Limited examinations for reoccurring indications may be performed as noted.  Findings: +--------------------+----------+-----------------+--------+ AVF                 PSV (cm/s)Flow Vol (mL/min)Comments +--------------------+----------+-----------------+--------+ Native artery inflow   270          1416                +--------------------+----------+-----------------+--------+ AVF Anastomosis        505                              +--------------------+----------+-----------------+--------+  +---------------+----------+-------------+----------+--------+ OUTFLOW VEIN   PSV (cm/s)Diameter (cm)Depth (cm)Describe +---------------+----------+-------------+----------+--------+ Subclavian vein    79                                    +---------------+----------+-------------+----------+--------+ Confluence        112                                    +---------------+----------+-------------+----------+--------+ Shoulder          163                                     +---------------+----------+-------------+----------+--------+ Prox UA           206                                    +---------------+----------+-------------+----------+--------+ Mid UA            196                                    +---------------+----------+-------------+----------+--------+ Dist UA           453                                    +---------------+----------+-------------+----------+--------+  +--------------+-------------+---------+---------+---------+-------------------+               Diameter (cm)  Depth  Branching   PSV       Flow Volume                                  (cm)             (  cm/s)       (ml/min)       +--------------+-------------+---------+---------+---------+-------------------+ Lt Rad Art                                      37                        Dist                                                                      +--------------+-------------+---------+---------+---------+-------------------+  Summary: The Left Brachial Cephalic AVF appears to be patent throughout; Flow Volume appears to be Normal.  *See table(s) above for measurements and observations.  Diagnosing physician: [name] MD Electronically signed by [name] MD on 10/05/2021 at 2:07:13 PM.   --------------------------------------------------------------------------------   Final    VAS [place] (AVF,AVG)  Result Date: 09/17/2021 DIALYSIS ACCESS Patient Name:  Rihan Schueler.  Date of Exam:   09/16/2021 Medical Rec #: [id number]             Accession #:    [id number] Date of Birth: 1970-06-19              Patient Gender: M Patient Age:   30 years Exam Location:  [facility] Procedure:      VAS US DUPLEX DIALYSIS ACCESS (AVF, AVG) Referring Phys: [name] --------------------------------------------------------------------------------  Access Site: Left Upper Extremity. Access Type: Brachial-cephalic  AVF. History: 08/13/21: Left brachial-cephalic AVF creation;           08/31/2021: Left Arm Fistulagram including central Venogram. PTA of          Left innominate vein with a 12 mm diameter angioplasty balloon. [place]          guidance for Vascular access Right Jugular vein. Fuoroscopic guidance          for placement of catheter. Placement of 19 cm tip to cuff tunneled          hemodialysis catheter Right Jugular vein. Removal of Left Jugular          dialysis catheter. Comparison Study: 08/20/2021 Performing Technologist: Almira Coaster RVS  Examination Guidelines: A complete evaluation includes B-mode imaging, spectral Doppler, color Doppler, and power Doppler as needed of all accessible portions of each vessel. Unilateral testing is considered an integral part of a complete examination. Limited examinations for reoccurring indications may be performed as noted.  Findings: +--------------------+----------+-----------------+--------+ AVF                 PSV (cm/s)Flow Vol (mL/min)Comments +--------------------+----------+-----------------+--------+ Native artery inflow   347          1965                +--------------------+----------+-----------------+--------+ AVF Anastomosis        357                              +--------------------+----------+-----------------+--------+  +---------------+----------+-------------+----------+--------+ OUTFLOW VEIN   PSV (cm/s)Diameter (cm)Depth (cm)Describe +---------------+----------+-------------+----------+--------+ Subclavian vein   148                                    +---------------+----------+-------------+----------+--------+  Confluence        193                                    +---------------+----------+-------------+----------+--------+ Shoulder          142                                    +---------------+----------+-------------+----------+--------+ Prox UA           241                                     +---------------+----------+-------------+----------+--------+ Mid UA            223                                    +---------------+----------+-------------+----------+--------+ Dist UA           301                                    +---------------+----------+-------------+----------+--------+  +--------------+-------------+---------+---------+---------+-------------------+               Diameter (cm)  Depth  Branching   PSV       Flow Volume                                  (cm)             (cm/s)       (ml/min)       +--------------+-------------+---------+---------+---------+-------------------+ Lt Rad Art                                      68                        Dist                                                                      +--------------+-------------+---------+---------+---------+-------------------+  Summary: The Left Brachial Cephalic AVF appears to be patent throughout; Flow Volume appears to be Normal. A Hematoma was seen in the Distal Upper Arm area measuring 3.19 cms x 1.58 cms with no flow present.  *See table(s) above for measurements and observations.  Diagnosing physician: [name] MD Electronically signed by [name] MD on 09/17/2021 at 64:18:16 AM.   --------------------------------------------------------------------------------   Final     Assessment/Plan  ESRD on hemodialysis [facility]) He had a duplex about a week and a half ago demonstrating a patent left brachiocephalic AV fistula.  This was placed about 2 months ago.  This can be used for the dialysis once the infiltration has gone down some.  We really need the most experienced techs sticking his  access given the relatively new nature of the fistula, the previous infiltration, and his anxiety.  Once the bruising and swelling has gone down a bit, we can begin using this fistula at any point.  We would prefer to get this fistula off of running so we can get his catheter out.  I  will plan to recheck it in about 2 to 3 months with a duplex.  Essential hypertension, benign blood pressure control important in reducing the progression of atherosclerotic disease. On appropriate oral medications.  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   Uncontrolled type 2 diabetes mellitus with hyperglycemia, with long-term current use of insulin (HCC) blood glucose control important in reducing the progression of atherosclerotic disease. Also, involved in wound healing. On appropriate medications.    [name], MD  10/13/2021 9:30 AM    This note was created with Dragon medical transcription system.  Any errors from dictation are purely unintentional

## 2021-10-13 NOTE — Assessment & Plan Note (Signed)
blood glucose control important in reducing the progression of atherosclerotic disease. Also, involved in wound healing. On appropriate medications.  

## 2021-10-13 NOTE — Assessment & Plan Note (Signed)
He had a duplex about a week and a half ago demonstrating a patent left brachiocephalic AV fistula.  This was placed about 2 months ago.  This can be used for the dialysis once the infiltration has gone down some.  We really need the most experienced techs sticking his access given the relatively new nature of the fistula, the previous infiltration, and his anxiety.  Once the bruising and swelling has gone down a bit, we can begin using this fistula at any point.  We would prefer to get this fistula off of running so we can get his catheter out.  I will plan to recheck it in about 2 to 3 months with a duplex.

## 2021-10-13 NOTE — Assessment & Plan Note (Signed)
blood pressure control important in reducing the progression of atherosclerotic disease. On appropriate oral medications.  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

## 2021-10-21 ENCOUNTER — Ambulatory Visit (INDEPENDENT_AMBULATORY_CARE_PROVIDER_SITE_OTHER): Payer: Medicare HMO | Admitting: Nurse Practitioner

## 2021-10-24 ENCOUNTER — Other Ambulatory Visit: Payer: Self-pay

## 2021-10-24 ENCOUNTER — Encounter (HOSPITAL_COMMUNITY): Payer: Self-pay | Admitting: Emergency Medicine

## 2021-10-24 ENCOUNTER — Emergency Department (HOSPITAL_COMMUNITY): Payer: Medicare Other

## 2021-10-24 ENCOUNTER — Emergency Department (HOSPITAL_COMMUNITY)
Admission: EM | Admit: 2021-10-24 | Discharge: 2021-10-24 | Disposition: A | Discharge status: Home / Self Care | Payer: Medicare Other | Attending: Emergency Medicine | Admitting: Emergency Medicine

## 2021-10-24 DIAGNOSIS — I132 Hypertensive heart and chronic kidney disease with heart failure and with stage 5 chronic kidney disease, or end stage renal disease: Secondary | ICD-10-CM | POA: Diagnosis not present

## 2021-10-24 DIAGNOSIS — I493 Ventricular premature depolarization: Secondary | ICD-10-CM | POA: Diagnosis not present

## 2021-10-24 DIAGNOSIS — E1122 Type 2 diabetes mellitus with diabetic chronic kidney disease: Secondary | ICD-10-CM | POA: Insufficient documentation

## 2021-10-24 DIAGNOSIS — E1140 Type 2 diabetes mellitus with diabetic neuropathy, unspecified: Secondary | ICD-10-CM | POA: Diagnosis not present

## 2021-10-24 DIAGNOSIS — I509 Heart failure, unspecified: Secondary | ICD-10-CM | POA: Insufficient documentation

## 2021-10-24 DIAGNOSIS — Z7902 Long term (current) use of antithrombotics/antiplatelets: Secondary | ICD-10-CM | POA: Insufficient documentation

## 2021-10-24 DIAGNOSIS — R001 Bradycardia, unspecified: Secondary | ICD-10-CM | POA: Diagnosis present

## 2021-10-24 DIAGNOSIS — Z79899 Other long term (current) drug therapy: Secondary | ICD-10-CM | POA: Insufficient documentation

## 2021-10-24 DIAGNOSIS — D649 Anemia, unspecified: Secondary | ICD-10-CM | POA: Diagnosis not present

## 2021-10-24 DIAGNOSIS — Z992 Dependence on renal dialysis: Secondary | ICD-10-CM | POA: Diagnosis not present

## 2021-10-24 DIAGNOSIS — N186 End stage renal disease: Secondary | ICD-10-CM | POA: Insufficient documentation

## 2021-10-24 DIAGNOSIS — Z7901 Long term (current) use of anticoagulants: Secondary | ICD-10-CM | POA: Insufficient documentation

## 2021-10-24 LAB — CBC WITH DIFFERENTIAL/PLATELET
Abs Immature Granulocytes: 0.01 10*3/uL (ref 0.00–0.07)
Basophils Absolute: 0 10*3/uL (ref 0.0–0.1)
Basophils Relative: 1 %
Eosinophils Absolute: 0.3 10*3/uL (ref 0.0–0.5)
Eosinophils Relative: 6 %
HCT: 25.2 % — ABNORMAL LOW (ref 39.0–52.0)
Hemoglobin: 7.3 g/dL — ABNORMAL LOW (ref 13.0–17.0)
Immature Granulocytes: 0 %
Lymphocytes Relative: 27 %
Lymphs Abs: 1.5 10*3/uL (ref 0.7–4.0)
MCH: 22.7 pg — ABNORMAL LOW (ref 26.0–34.0)
MCHC: 29 g/dL — ABNORMAL LOW (ref 30.0–36.0)
MCV: 78.5 fL — ABNORMAL LOW (ref 80.0–100.0)
Monocytes Absolute: 0.6 10*3/uL (ref 0.1–1.0)
Monocytes Relative: 11 %
Neutro Abs: 3.1 10*3/uL (ref 1.7–7.7)
Neutrophils Relative %: 55 %
Platelets: 127 10*3/uL — ABNORMAL LOW (ref 150–400)
RBC: 3.21 MIL/uL — ABNORMAL LOW (ref 4.22–5.81)
RDW: 21.9 % — ABNORMAL HIGH (ref 11.5–15.5)
WBC: 5.6 10*3/uL (ref 4.0–10.5)
nRBC: 0 % (ref 0.0–0.2)

## 2021-10-24 LAB — BASIC METABOLIC PANEL WITH GFR
Anion gap: 10 (ref 5–15)
BUN: 42 mg/dL — ABNORMAL HIGH (ref 6–20)
CO2: 28 mmol/L (ref 22–32)
Calcium: 8.5 mg/dL — ABNORMAL LOW (ref 8.9–10.3)
Chloride: 106 mmol/L (ref 98–111)
Creatinine, Ser: 10.2 mg/dL — ABNORMAL HIGH (ref 0.61–1.24)
GFR, Estimated: 6 mL/min — ABNORMAL LOW
Glucose, Bld: 81 mg/dL (ref 70–99)
Potassium: 4.4 mmol/L (ref 3.5–5.1)
Sodium: 144 mmol/L (ref 135–145)

## 2021-10-24 NOTE — Discharge Instructions (Addendum)
Follow-up with your docs and make an appointment follow-up with cardiology.  You are cleared to have dialysis done today.

## 2021-10-24 NOTE — ED Triage Notes (Signed)
Pt went to dialysis. Nurse took vitals and reported bradycardia. Nurse sent pt to ED via [facility]. Current HR is 66

## 2021-10-24 NOTE — ED Provider Notes (Signed)
[facility] Provider Note   CSN: [id number] Arrival date & time: 10/24/21  9702     History  Chief Complaint  Patient presents with   Bradycardia    [name]. is a 51 y.o. male.  Patient sent in from dialysis for bradycardia they said his heart rate was in the 40s.  Heart rate upon arrival here was 66.  Patient is normally dialyzed Tuesday Thursday and Saturdays was normal day.  He did not get any dialysis today due to the low heart rate.  Patient without any symptoms whatsoever.  Patient states he felt fine.  Oxygen saturation 100% blood pressure 173/77.  Past medical history significant for the end-stage renal disease on dialysis diabetes diabetic neuropathy hypertension hyperlipidemia heart palpitations congestive heart failure history of atrial fibrillation history of non-STEMI in June 2023.  Patient on Pacerone patient on Coreg patient on Plavix patient on Keppra patient not on any other blood thinners.  Amputations of portions of digits on both feet.  In addition patient had no chest pain no shortness of breath.       Home Medications Prior to Admission medications   Medication Sig Start Date End Date Taking? Authorizing Provider  acetaminophen (TYLENOL) 325 MG tablet Take 1-2 tablets (325-650 mg total) by mouth every 4 (four) hours as needed for mild pain. 05/04/21   Love, Ivan Anchors, PA-C  amiodarone (PACERONE) 200 MG tablet Take 1 tablet (200 mg total) by mouth every morning. 08/10/21 08/11/22  Swinyer, Lanice Schwab, NP  amLODipine (NORVASC) 5 MG tablet Take 5 mg by mouth daily. 08/03/21   [provider]  atorvastatin (LIPITOR) 80 MG tablet Take 1 tablet (80 mg total) by mouth daily. Patient taking differently: Take 80 mg by mouth at bedtime. 05/04/21 04/29/22  Love, Ivan Anchors, PA-C  calcium acetate (PHOSLO) 667 MG capsule Take 2 capsules (1,334 mg total) by mouth 3 (three) times daily with meals. 05/04/21   Love, Ivan Anchors, PA-C  carvedilol  (COREG) 25 MG tablet Take 25 mg by mouth 2 (two) times daily. 06/16/21   [provider]  clonazePAM (KLONOPIN) 0.5 MG disintegrating tablet Take 1 tablet (0.5 mg total) by mouth daily as needed for seizure (for seizures). 07/30/21 08/29/21  Nita Sells, MD  cloNIDine (CATAPRES) 0.1 MG tablet Take 1 tablet (0.1 mg total) by mouth 3 (three) times daily as needed (systolic blood pressure (top number) is Greater than 200 or DBP (lower number)is Greater than 110). 08/10/21 08/11/22  Swinyer, Lanice Schwab, NP  clopidogrel (PLAVIX) 75 MG tablet Take 1 tablet (75 mg total) by mouth daily. 05/04/21   Love, Ivan Anchors, PA-C  dorzolamide-timolol (COSOPT) 22.3-6.8 MG/ML ophthalmic solution Place 1 drop into the right eye 2 (two) times daily.    [provider]  doxycycline (VIBRAMYCIN) 100 MG capsule Take 1 capsule (100 mg total) by mouth 2 (two) times daily. 08/26/21   Kris Hartmann, NP  ELIQUIS 2.5 MG TABS tablet Take 2 tablets (5 mg total) by mouth 2 (two) times daily. 07/30/21   Nita Sells, MD  erythromycin ophthalmic ointment Place 1 Application into both eyes daily. 06/19/21   [provider]  HYDROcodone-acetaminophen (NORCO/VICODIN) 5-325 MG tablet Take 2 tablets by mouth every 6 (six) hours as needed for moderate pain. 08/13/21 08/13/22  Algernon Huxley, MD  hydrocortisone 2.5 % cream Place rectally. 06/04/21   [provider]  lactobacillus (FLORANEX/LACTINEX) PACK Take 1 packet (1 g total) by mouth 3 (three) times  daily with meals. 05/04/21   Love, Ivan Anchors, PA-C  levETIRAcetam (KEPPRA) 250 MG tablet Take 1 tablet (250 mg total) by mouth Every Tuesday,Thursday,and Saturday with dialysis. 07/30/21   Nita Sells, MD  levETIRAcetam (KEPPRA) 500 MG tablet Take 1 tablet (500 mg total) by mouth 2 (two) times daily. 07/30/21   Nita Sells, MD  melatonin 5 MG TABS Take 1 tablet (5 mg total) by mouth at bedtime as needed. 05/04/21   Love, Ivan Anchors, PA-C  mupirocin  ointment (BACTROBAN) 2 % Apply 1 Application topically 2 (two) times daily. 08/20/21   Kris Hartmann, NP  neomycin-polymyxin b-dexamethasone (MAXITROL) 3.5-10000-0.1 OINT Place 1 application. into the left eye in the morning and at bedtime. 06/19/21   [provider]  nitroGLYCERIN (NITROSTAT) 0.4 MG SL tablet Place 1 tablet (0.4 mg total) under the tongue every 5 (five) minutes as needed for chest pain. 12/09/20 12/09/21  Johnson, Clanford L, MD  phenylephrine (,USE FOR PREPARATION-H,) 0.25 % suppository Place 1 suppository rectally 2 (two) times daily as needed for hemorrhoids. 05/04/21   Love, Ivan Anchors, PA-C  prednisoLONE acetate (PRED FORTE) 1 % ophthalmic suspension Place 1 drop into the left eye every 4 (four) hours. 05/04/21   Love, Ivan Anchors, PA-C  PREPARATION H 0.25-88.44 % suppository Place rectally. 05/04/21   [provider]  tamsulosin (FLOMAX) 0.4 MG CAPS capsule Take 0.4 mg by mouth daily. 05/19/21   [provider]  witch hazel-glycerin (TUCKS) pad Apply topically as needed for itching. 05/04/21   Love, Ivan Anchors, PA-C      Allergies    Promethazine, Promethazine hcl, Other, and Malt    Review of Systems   Review of Systems  Constitutional:  Negative for chills and fever.  HENT:  Negative for ear pain and sore throat.   Eyes:  Negative for pain and visual disturbance.  Respiratory:  Negative for cough and shortness of breath.   Cardiovascular:  Positive for palpitations. Negative for chest pain and leg swelling.  Gastrointestinal:  Negative for abdominal pain and vomiting.  Genitourinary:  Negative for dysuria and hematuria.  Musculoskeletal:  Negative for arthralgias and back pain.  Skin:  Negative for color change and rash.  Neurological:  Negative for seizures and syncope.  All other systems reviewed and are negative.   Physical Exam Updated Vital Signs BP (!) 183/91   Pulse 61   Temp 97.8 F (36.6 C) (Oral)   Resp 15   Ht 1.905 m (6\' 3" )    Wt 82.6 kg   SpO2 100%   BMI 22.75 kg/m  Physical Exam Vitals and nursing note reviewed.  Constitutional:      General: He is not in acute distress.    Appearance: Normal appearance. He is well-developed.  HENT:     Head: Normocephalic and atraumatic.     Mouth/Throat:     Mouth: Mucous membranes are moist.  Eyes:     Extraocular Movements: Extraocular movements intact.     Conjunctiva/sclera: Conjunctivae normal.     Pupils: Pupils are equal, round, and reactive to light.  Cardiovascular:     Rate and Rhythm: Regular rhythm. Bradycardia present.     Heart sounds: No murmur heard. Pulmonary:     Effort: Pulmonary effort is normal. No respiratory distress.     Breath sounds: Normal breath sounds.     Comments: Dialysis catheter right anterior chest. Abdominal:     Palpations: Abdomen is soft.     Tenderness: There is  no abdominal tenderness.  Musculoskeletal:        General: No swelling.     Cervical back: Normal range of motion and neck supple.     Comments: Digit amputations  Skin:    General: Skin is warm and dry.     Capillary Refill: Capillary refill takes less than 2 seconds.  Neurological:     Mental Status: He is alert. Mental status is at baseline.  Psychiatric:        Mood and Affect: Mood normal.     ED Results / Procedures / Treatments   Labs (all labs ordered are listed, but only abnormal results are displayed) Labs Reviewed  CBC WITH DIFFERENTIAL/PLATELET - Abnormal; Notable for the following components:      Result Value   RBC 3.21 (*)    Hemoglobin 7.3 (*)    HCT 25.2 (*)    MCV 78.5 (*)    MCH 22.7 (*)    MCHC 29.0 (*)    RDW 21.9 (*)    Platelets 127 (*)    All other components within normal limits  BASIC METABOLIC PANEL - Abnormal; Notable for the following components:   BUN 42 (*)    Creatinine, Ser 10.20 (*)    Calcium 8.5 (*)    GFR, Estimated 6 (*)    All other components within normal limits    EKG EKG  Interpretation  Date/Time:  Saturday October 24 2021 07:28:56 EDT Ventricular Rate:  58 PR Interval:  178 QRS Duration: 110 QT Interval:  474 QTC Calculation: 466 R Axis:   45 Text Interpretation: Sinus rhythm Ventricular trigeminy Borderline repolarization abnormality Borderline ST elevation, anterior leads Confirmed by Fredia Sorrow [phone]) on 10/24/2021 7:50:57 AM  Radiology DG Chest Port 1 View  Result Date: 10/24/2021 CLINICAL DATA:  Pt went to dialysis. Nurse took vitals and reported bradycardia. Current HR is 66 EXAM: PORTABLE CHEST 1 VIEW COMPARISON:  07/27/2021. FINDINGS: Stable enlargement of the cardiopericardial silhouette. No mediastinal or hilar masses. Right internal jugular tunneled dual lumen central venous catheter has its distal port tip in the right atrium. This replaces a left sided central venous catheter noted on the prior exam. Lungs are clear.  No pleural effusion or pneumothorax. Skeletal structures are grossly intact. IMPRESSION: No acute cardiopulmonary disease. Electronically Signed   By: Lajean Manes M.D.   On: 10/24/2021 08:45    Procedures Procedures    Medications Ordered in ED Medications - No data to display  ED Course/ Medical Decision Making/ A&P                           Medical Decision Making Amount and/or Complexity of Data Reviewed Labs: ordered. Radiology: ordered.   Cardiac monitor showing sinus bradycardia occasional PVCs at 1 point had trigeminy.  But sort of leveled out while he was here.  Chest x-ray showed no evidence of any significant pulmonary congestion.  CBC no leukocytosis hemoglobin 7.3.  Basic metabolic panel potassium good at 4.4 not elevated.  Patient cardiac monitoring showed no significant arrhythmia as just mostly sinus bradycardia.  No significant change in EKG.  No evidence of atrial fibrillation.  There were evidence of premature ventricular contractions.  Patient stable discharge back to dialysis we contacted  them and they would be able to do his dialysis today.   Final Clinical Impression(s) / ED Diagnoses Final diagnoses:  ESRD on dialysis ([facility])  PVC (premature ventricular contraction)  Rx / DC Orders ED Discharge Orders     None         Fredia Sorrow, MD 10/24/21 [phone]

## 2021-11-21 ENCOUNTER — Emergency Department (HOSPITAL_COMMUNITY): Payer: Medicare HMO

## 2021-11-21 ENCOUNTER — Inpatient Hospital Stay (HOSPITAL_COMMUNITY)
Admission: EM | Admit: 2021-11-21 | Discharge: 2021-12-05 | DRG: 064 | Disposition: A | Discharge status: Home Health | Payer: Medicare HMO | Attending: Internal Medicine | Admitting: Internal Medicine

## 2021-11-21 ENCOUNTER — Other Ambulatory Visit: Payer: Self-pay

## 2021-11-21 ENCOUNTER — Inpatient Hospital Stay (HOSPITAL_COMMUNITY): Payer: Medicare HMO

## 2021-11-21 ENCOUNTER — Encounter (HOSPITAL_COMMUNITY): Payer: Self-pay

## 2021-11-21 DIAGNOSIS — Z79899 Other long term (current) drug therapy: Secondary | ICD-10-CM

## 2021-11-21 DIAGNOSIS — Y92009 Unspecified place in unspecified non-institutional (private) residence as the place of occurrence of the external cause: Secondary | ICD-10-CM | POA: Diagnosis not present

## 2021-11-21 DIAGNOSIS — D6832 Hemorrhagic disorder due to extrinsic circulating anticoagulants: Secondary | ICD-10-CM | POA: Diagnosis present

## 2021-11-21 DIAGNOSIS — Z89432 Acquired absence of left foot: Secondary | ICD-10-CM

## 2021-11-21 DIAGNOSIS — L89322 Pressure ulcer of left buttock, stage 2: Secondary | ICD-10-CM | POA: Diagnosis present

## 2021-11-21 DIAGNOSIS — Z992 Dependence on renal dialysis: Secondary | ICD-10-CM | POA: Diagnosis not present

## 2021-11-21 DIAGNOSIS — Z8619 Personal history of other infectious and parasitic diseases: Secondary | ICD-10-CM

## 2021-11-21 DIAGNOSIS — Z8249 Family history of ischemic heart disease and other diseases of the circulatory system: Secondary | ICD-10-CM | POA: Diagnosis not present

## 2021-11-21 DIAGNOSIS — T45515A Adverse effect of anticoagulants, initial encounter: Secondary | ICD-10-CM | POA: Diagnosis present

## 2021-11-21 DIAGNOSIS — Z888 Allergy status to other drugs, medicaments and biological substances status: Secondary | ICD-10-CM

## 2021-11-21 DIAGNOSIS — R29700 NIHSS score 0: Secondary | ICD-10-CM | POA: Diagnosis present

## 2021-11-21 DIAGNOSIS — D638 Anemia in other chronic diseases classified elsewhere: Secondary | ICD-10-CM | POA: Diagnosis present

## 2021-11-21 DIAGNOSIS — L8990 Pressure ulcer of unspecified site, unspecified stage: Secondary | ICD-10-CM | POA: Insufficient documentation

## 2021-11-21 DIAGNOSIS — Z20822 Contact with and (suspected) exposure to covid-19: Secondary | ICD-10-CM | POA: Diagnosis present

## 2021-11-21 DIAGNOSIS — F419 Anxiety disorder, unspecified: Secondary | ICD-10-CM | POA: Diagnosis present

## 2021-11-21 DIAGNOSIS — I2510 Atherosclerotic heart disease of native coronary artery without angina pectoris: Secondary | ICD-10-CM | POA: Diagnosis present

## 2021-11-21 DIAGNOSIS — E1151 Type 2 diabetes mellitus with diabetic peripheral angiopathy without gangrene: Secondary | ICD-10-CM | POA: Diagnosis present

## 2021-11-21 DIAGNOSIS — I161 Hypertensive emergency: Secondary | ICD-10-CM | POA: Diagnosis present

## 2021-11-21 DIAGNOSIS — I615 Nontraumatic intracerebral hemorrhage, intraventricular: Secondary | ICD-10-CM | POA: Diagnosis not present

## 2021-11-21 DIAGNOSIS — R296 Repeated falls: Secondary | ICD-10-CM | POA: Diagnosis present

## 2021-11-21 DIAGNOSIS — E43 Unspecified severe protein-calorie malnutrition: Secondary | ICD-10-CM | POA: Diagnosis present

## 2021-11-21 DIAGNOSIS — R911 Solitary pulmonary nodule: Secondary | ICD-10-CM | POA: Diagnosis present

## 2021-11-21 DIAGNOSIS — N400 Enlarged prostate without lower urinary tract symptoms: Secondary | ICD-10-CM | POA: Diagnosis present

## 2021-11-21 DIAGNOSIS — Z8673 Personal history of transient ischemic attack (TIA), and cerebral infarction without residual deficits: Secondary | ICD-10-CM

## 2021-11-21 DIAGNOSIS — I951 Orthostatic hypotension: Secondary | ICD-10-CM | POA: Diagnosis not present

## 2021-11-21 DIAGNOSIS — G40909 Epilepsy, unspecified, not intractable, without status epilepticus: Secondary | ICD-10-CM | POA: Diagnosis present

## 2021-11-21 DIAGNOSIS — R7881 Bacteremia: Secondary | ICD-10-CM | POA: Diagnosis not present

## 2021-11-21 DIAGNOSIS — I252 Old myocardial infarction: Secondary | ICD-10-CM | POA: Diagnosis not present

## 2021-11-21 DIAGNOSIS — I619 Nontraumatic intracerebral hemorrhage, unspecified: Secondary | ICD-10-CM | POA: Diagnosis present

## 2021-11-21 DIAGNOSIS — K5900 Constipation, unspecified: Secondary | ICD-10-CM | POA: Diagnosis not present

## 2021-11-21 DIAGNOSIS — R197 Diarrhea, unspecified: Secondary | ICD-10-CM | POA: Diagnosis not present

## 2021-11-21 DIAGNOSIS — R509 Fever, unspecified: Secondary | ICD-10-CM | POA: Diagnosis not present

## 2021-11-21 DIAGNOSIS — E1122 Type 2 diabetes mellitus with diabetic chronic kidney disease: Secondary | ICD-10-CM | POA: Diagnosis present

## 2021-11-21 DIAGNOSIS — Z841 Family history of disorders of kidney and ureter: Secondary | ICD-10-CM

## 2021-11-21 DIAGNOSIS — I3139 Other pericardial effusion (noninflammatory): Secondary | ICD-10-CM | POA: Diagnosis present

## 2021-11-21 DIAGNOSIS — L89891 Pressure ulcer of other site, stage 1: Secondary | ICD-10-CM | POA: Diagnosis present

## 2021-11-21 DIAGNOSIS — I132 Hypertensive heart and chronic kidney disease with heart failure and with stage 5 chronic kidney disease, or end stage renal disease: Secondary | ICD-10-CM | POA: Diagnosis present

## 2021-11-21 DIAGNOSIS — Z823 Family history of stroke: Secondary | ICD-10-CM

## 2021-11-21 DIAGNOSIS — I629 Nontraumatic intracranial hemorrhage, unspecified: Secondary | ICD-10-CM | POA: Diagnosis not present

## 2021-11-21 DIAGNOSIS — Z87898 Personal history of other specified conditions: Secondary | ICD-10-CM | POA: Diagnosis not present

## 2021-11-21 DIAGNOSIS — Z6821 Body mass index (BMI) 21.0-21.9, adult: Secondary | ICD-10-CM

## 2021-11-21 DIAGNOSIS — E785 Hyperlipidemia, unspecified: Secondary | ICD-10-CM | POA: Diagnosis present

## 2021-11-21 DIAGNOSIS — E876 Hypokalemia: Secondary | ICD-10-CM | POA: Diagnosis present

## 2021-11-21 DIAGNOSIS — Z87442 Personal history of urinary calculi: Secondary | ICD-10-CM

## 2021-11-21 DIAGNOSIS — N186 End stage renal disease: Secondary | ICD-10-CM | POA: Diagnosis not present

## 2021-11-21 DIAGNOSIS — R9431 Abnormal electrocardiogram [ECG] [EKG]: Secondary | ICD-10-CM | POA: Diagnosis present

## 2021-11-21 DIAGNOSIS — Z8674 Personal history of sudden cardiac arrest: Secondary | ICD-10-CM

## 2021-11-21 DIAGNOSIS — I5032 Chronic diastolic (congestive) heart failure: Secondary | ICD-10-CM | POA: Diagnosis present

## 2021-11-21 DIAGNOSIS — Z7901 Long term (current) use of anticoagulants: Secondary | ICD-10-CM

## 2021-11-21 DIAGNOSIS — K219 Gastro-esophageal reflux disease without esophagitis: Secondary | ICD-10-CM | POA: Diagnosis present

## 2021-11-21 DIAGNOSIS — Z9001 Acquired absence of eye: Secondary | ICD-10-CM

## 2021-11-21 DIAGNOSIS — I517 Cardiomegaly: Secondary | ICD-10-CM | POA: Diagnosis not present

## 2021-11-21 DIAGNOSIS — I480 Paroxysmal atrial fibrillation: Secondary | ICD-10-CM | POA: Diagnosis present

## 2021-11-21 DIAGNOSIS — R569 Unspecified convulsions: Secondary | ICD-10-CM | POA: Diagnosis not present

## 2021-11-21 DIAGNOSIS — G4700 Insomnia, unspecified: Secondary | ICD-10-CM | POA: Diagnosis present

## 2021-11-21 DIAGNOSIS — Z818 Family history of other mental and behavioral disorders: Secondary | ICD-10-CM

## 2021-11-21 DIAGNOSIS — Z833 Family history of diabetes mellitus: Secondary | ICD-10-CM

## 2021-11-21 DIAGNOSIS — R066 Hiccough: Secondary | ICD-10-CM | POA: Diagnosis not present

## 2021-11-21 DIAGNOSIS — T827XXA Infection and inflammatory reaction due to other cardiac and vascular devices, implants and grafts, initial encounter: Secondary | ICD-10-CM | POA: Diagnosis not present

## 2021-11-21 DIAGNOSIS — Z7902 Long term (current) use of antithrombotics/antiplatelets: Secondary | ICD-10-CM

## 2021-11-21 HISTORY — DX: Nontraumatic intracerebral hemorrhage, unspecified: I61.9

## 2021-11-21 LAB — MAGNESIUM: Magnesium: 1.9 mg/dL (ref 1.7–2.4)

## 2021-11-21 LAB — COMPREHENSIVE METABOLIC PANEL
ALT: 8 U/L (ref 0–44)
AST: 12 U/L — ABNORMAL LOW (ref 15–41)
Albumin: 3.3 g/dL — ABNORMAL LOW (ref 3.5–5.0)
Alkaline Phosphatase: 110 U/L (ref 38–126)
Anion gap: 11 (ref 5–15)
BUN: 28 mg/dL — ABNORMAL HIGH (ref 6–20)
CO2: 25 mmol/L (ref 22–32)
Calcium: 8.4 mg/dL — ABNORMAL LOW (ref 8.9–10.3)
Chloride: 103 mmol/L (ref 98–111)
Creatinine, Ser: 7.69 mg/dL — ABNORMAL HIGH (ref 0.61–1.24)
GFR, Estimated: 8 mL/min — ABNORMAL LOW (ref >60)
Glucose, Bld: 88 mg/dL (ref 70–99)
Potassium: 3.4 mmol/L — ABNORMAL LOW (ref 3.5–5.1)
Sodium: 139 mmol/L (ref 135–145)
Total Bilirubin: 1.2 mg/dL (ref 0.3–1.2)
Total Protein: 6.5 g/dL (ref 6.5–8.1)

## 2021-11-21 LAB — CBC
HCT: 32.6 % — ABNORMAL LOW (ref 39.0–52.0)
Hemoglobin: 9.8 g/dL — ABNORMAL LOW (ref 13.0–17.0)
MCH: 22.3 pg — ABNORMAL LOW (ref 26.0–34.0)
MCHC: 30.1 g/dL (ref 30.0–36.0)
MCV: 74.3 fL — ABNORMAL LOW (ref 80.0–100.0)
Platelets: 140 10*3/uL — ABNORMAL LOW (ref 150–400)
RBC: 4.39 MIL/uL (ref 4.22–5.81)
RDW: 21.9 % — ABNORMAL HIGH (ref 11.5–15.5)
WBC: 6.6 10*3/uL (ref 4.0–10.5)
nRBC: 0 % (ref 0.0–0.2)

## 2021-11-21 LAB — RESP PANEL BY RT-PCR (FLU A&B, COVID) ARPGX2
Influenza A by PCR: NEGATIVE
Influenza B by PCR: NEGATIVE
SARS Coronavirus 2 by RT PCR: NEGATIVE

## 2021-11-21 LAB — LIPID PANEL
Cholesterol: 94 mg/dL (ref 0–200)
HDL: 39 mg/dL — ABNORMAL LOW (ref >40)
LDL Cholesterol: 34 mg/dL (ref 0–99)
Total CHOL/HDL Ratio: 2.4 RATIO
Triglycerides: 106 mg/dL (ref <150)
VLDL: 21 mg/dL (ref 0–40)

## 2021-11-21 LAB — PROTIME-INR
INR: 1.6 — ABNORMAL HIGH (ref 0.8–1.2)
Prothrombin Time: 19 seconds — ABNORMAL HIGH (ref 11.4–15.2)

## 2021-11-21 LAB — BRAIN NATRIURETIC PEPTIDE: B Natriuretic Peptide: 1416 pg/mL — ABNORMAL HIGH (ref 0.0–100.0)

## 2021-11-21 LAB — TROPONIN I (HIGH SENSITIVITY)
Troponin I (High Sensitivity): 44 ng/L — ABNORMAL HIGH (ref <18)
Troponin I (High Sensitivity): 44 ng/L — ABNORMAL HIGH (ref <18)

## 2021-11-21 LAB — HEMOGLOBIN A1C
Hgb A1c MFr Bld: 4.5 % — ABNORMAL LOW (ref 4.8–5.6)
Mean Plasma Glucose: 82.45 mg/dL

## 2021-11-21 LAB — APTT: aPTT: 37 seconds — ABNORMAL HIGH (ref 24–36)

## 2021-11-21 MED ORDER — CARVEDILOL 12.5 MG PO TABS
25.0000 mg | ORAL_TABLET | Freq: Two times a day (BID) | ORAL | Status: DC
  Administered 2021-11-22 – 2021-12-05 (×27): 25 mg via ORAL
  Filled 2021-11-21 (×28): qty 2

## 2021-11-21 MED ORDER — EMPTY CONTAINERS FLEXIBLE MISC
1800.0000 mg | Freq: Once | Status: AC
  Administered 2021-11-21: 1800 mg via INTRAVENOUS
  Filled 2021-11-21: qty 180

## 2021-11-21 MED ORDER — DORZOLAMIDE HCL-TIMOLOL MAL 2-0.5 % OP SOLN
1.0000 [drp] | Freq: Two times a day (BID) | OPHTHALMIC | Status: DC
  Administered 2021-11-22 – 2021-12-05 (×27): 1 [drp] via OPHTHALMIC
  Filled 2021-11-21: qty 10

## 2021-11-21 MED ORDER — CALCIUM ACETATE (PHOS BINDER) 667 MG PO CAPS
1334.0000 mg | ORAL_CAPSULE | Freq: Three times a day (TID) | ORAL | Status: DC
  Administered 2021-11-22 – 2021-12-04 (×31): 1334 mg via ORAL
  Filled 2021-11-21 (×37): qty 2

## 2021-11-21 MED ORDER — TAMSULOSIN HCL 0.4 MG PO CAPS
0.4000 mg | ORAL_CAPSULE | Freq: Every day | ORAL | Status: DC
  Administered 2021-11-22 – 2021-12-05 (×14): 0.4 mg via ORAL
  Filled 2021-11-21 (×14): qty 1

## 2021-11-21 MED ORDER — CLONIDINE HCL 0.1 MG PO TABS
0.1000 mg | ORAL_TABLET | Freq: Once | ORAL | Status: AC
  Administered 2021-11-21: 0.1 mg via ORAL
  Filled 2021-11-21: qty 1

## 2021-11-21 MED ORDER — ACETAMINOPHEN 650 MG RE SUPP: 650.0000 mg | RECTAL | Status: DC | PRN

## 2021-11-21 MED ORDER — SENNOSIDES-DOCUSATE SODIUM 8.6-50 MG PO TABS
1.0000 | ORAL_TABLET | Freq: Two times a day (BID) | ORAL | Status: DC
  Administered 2021-11-22 – 2021-12-04 (×14): 1 via ORAL
  Filled 2021-11-21 (×20): qty 1

## 2021-11-21 MED ORDER — IOHEXOL 350 MG/ML SOLN
100.0000 mL | Freq: Once | INTRAVENOUS | Status: AC | PRN
  Administered 2021-11-21: 100 mL via INTRAVENOUS

## 2021-11-21 MED ORDER — ACETAMINOPHEN 325 MG PO TABS
650.0000 mg | ORAL_TABLET | ORAL | Status: DC | PRN
  Administered 2021-11-21 – 2021-12-01 (×14): 650 mg via ORAL
  Filled 2021-11-21 (×16): qty 2

## 2021-11-21 MED ORDER — CLEVIDIPINE BUTYRATE 0.5 MG/ML IV EMUL
0.0000 mg/h | INTRAVENOUS | Status: DC
  Administered 2021-11-21: 2 mg/h via INTRAVENOUS
  Administered 2021-11-21 (×2): 10 mg/h via INTRAVENOUS
  Administered 2021-11-22: 4 mg/h via INTRAVENOUS
  Administered 2021-11-22: 8 mg/h via INTRAVENOUS
  Administered 2021-11-22: 11 mg/h via INTRAVENOUS
  Administered 2021-11-23: 6 mg/h via INTRAVENOUS
  Administered 2021-11-23: 8 mg/h via INTRAVENOUS
  Administered 2021-11-23 (×2): 5 mg/h via INTRAVENOUS
  Administered 2021-11-23: 6 mg/h via INTRAVENOUS
  Administered 2021-11-24: 7 mg/h via INTRAVENOUS
  Administered 2021-11-24: 3 mg/h via INTRAVENOUS
  Administered 2021-11-24: 10 mg/h via INTRAVENOUS
  Administered 2021-11-25: 16 mg/h via INTRAVENOUS
  Administered 2021-11-25 (×2): 12 mg/h via INTRAVENOUS
  Filled 2021-11-21 (×7): qty 100
  Filled 2021-11-21: qty 50
  Filled 2021-11-21: qty 100
  Filled 2021-11-21: qty 50
  Filled 2021-11-21 (×2): qty 100
  Filled 2021-11-21: qty 50
  Filled 2021-11-21 (×2): qty 100
  Filled 2021-11-21: qty 50

## 2021-11-21 MED ORDER — CLONIDINE HCL 0.1 MG PO TABS: 0.1000 mg | ORAL_TABLET | Freq: Three times a day (TID) | ORAL | Status: DC | PRN

## 2021-11-21 MED ORDER — PREDNISOLONE ACETATE 1 % OP SUSP
1.0000 [drp] | OPHTHALMIC | Status: DC
  Administered 2021-11-22 – 2021-12-05 (×62): 1 [drp] via OPHTHALMIC
  Filled 2021-11-21: qty 5

## 2021-11-21 MED ORDER — ATORVASTATIN CALCIUM 80 MG PO TABS: 80.0000 mg | ORAL_TABLET | Freq: Every day | ORAL | Status: DC

## 2021-11-21 MED ORDER — PANTOPRAZOLE SODIUM 40 MG IV SOLR
40.0000 mg | Freq: Every day | INTRAVENOUS | Status: DC
  Administered 2021-11-21: 40 mg via INTRAVENOUS
  Filled 2021-11-21: qty 10

## 2021-11-21 MED ORDER — FAMOTIDINE 20 MG PO TABS
20.0000 mg | ORAL_TABLET | Freq: Once | ORAL | Status: AC
  Administered 2021-11-21: 20 mg via ORAL
  Filled 2021-11-21: qty 1

## 2021-11-21 MED ORDER — CHLORHEXIDINE GLUCONATE CLOTH 2 % EX PADS
6.0000 | MEDICATED_PAD | Freq: Once | CUTANEOUS | Status: AC
  Administered 2021-11-21: 6 via TOPICAL

## 2021-11-21 MED ORDER — CHLORHEXIDINE GLUCONATE CLOTH 2 % EX PADS
6.0000 | MEDICATED_PAD | Freq: Every day | CUTANEOUS | Status: DC
  Administered 2021-11-22 – 2021-12-05 (×13): 6 via TOPICAL

## 2021-11-21 MED ORDER — ACETAMINOPHEN 160 MG/5ML PO SOLN: 650.0000 mg | ORAL | Status: DC | PRN

## 2021-11-21 MED ORDER — STROKE: EARLY STAGES OF RECOVERY BOOK
Freq: Once | Status: AC
  Filled 2021-11-21: qty 1

## 2021-11-21 MED ORDER — LEVETIRACETAM 500 MG PO TABS
500.0000 mg | ORAL_TABLET | Freq: Two times a day (BID) | ORAL | Status: DC
  Administered 2021-11-21 – 2021-12-03 (×23): 500 mg via ORAL
  Filled 2021-11-21 (×24): qty 1

## 2021-11-21 MED ORDER — AMIODARONE HCL 200 MG PO TABS
200.0000 mg | ORAL_TABLET | Freq: Every day | ORAL | Status: DC
  Administered 2021-11-22 – 2021-12-05 (×14): 200 mg via ORAL
  Filled 2021-11-21 (×14): qty 1

## 2021-11-21 MED ORDER — ACETAMINOPHEN 325 MG PO TABS
650.0000 mg | ORAL_TABLET | Freq: Once | ORAL | Status: AC
  Administered 2021-11-21: 650 mg via ORAL
  Filled 2021-11-21: qty 2

## 2021-11-21 MED ORDER — AMLODIPINE BESYLATE 5 MG PO TABS
5.0000 mg | ORAL_TABLET | Freq: Every day | ORAL | Status: DC
  Administered 2021-11-22: 5 mg via ORAL
  Filled 2021-11-21: qty 1

## 2021-11-21 MED ORDER — LEVETIRACETAM 250 MG PO TABS
250.0000 mg | ORAL_TABLET | ORAL | Status: DC
  Administered 2021-11-24 – 2021-12-05 (×4): 250 mg via ORAL
  Filled 2021-11-21 (×7): qty 1

## 2021-11-21 NOTE — ED Notes (Signed)
Pt received via EMS for ICH. Pt placed in gown and on continuous monitoring. Pt assessments completed. MRI calling for pt. Pt last known meal yesterday., denies claustrophobia or any metal in the body. Pt wife at bedside.

## 2021-11-21 NOTE — ED Notes (Signed)
Patient has fistula in arms bilaterally and dialysis port. Patient states that right fistula is oldest and is not being used, left placed approx 4 months ago. Patient to have IV and blood drawn from right arm per EDPA.

## 2021-11-21 NOTE — ED Provider Notes (Cosign Needed Addendum)
[facility] EMERGENCY DEPARTMENT Provider Note   CSN: [id number] Arrival date & time: 11/21/21  1000     History  Chief Complaint  Patient presents with   Weakness    Kodi Onesimo Lingard. is a 51 y.o. male.  HPI   Patient with medical history including hypertension, end-stage renal disease dialysis Tuesday Thursday Saturday, CHF, atrial fibrillation on Eliquis, diabetes, presents with complaints of feeling dehydrated.  Patient states after dialysis did  not feel well, states that he sometimes gets dehydrated and will need fluids afterwards.  States that he feels generalized weakness, no unilateral weakness, no paresthesias in the upper and lower extremities, no headaches no change in vision denies any chest pain or shortness of breath no stomach pains no nausea or vomiting.  States that he has full treatment today has not missed his previous treatments he has no other complaints.  States he has been compliant with all of his blood pressure medications took them all prior to arrival.    Home Medications Prior to Admission medications   Medication Sig Start Date End Date Taking? Authorizing Provider  acetaminophen (TYLENOL) 325 MG tablet Take 1-2 tablets (325-650 mg total) by mouth every 4 (four) hours as needed for mild pain. 05/04/21   Love, Ivan Anchors, PA-C  amiodarone (PACERONE) 200 MG tablet Take 1 tablet (200 mg total) by mouth every morning. 08/10/21 08/11/22  Swinyer, Lanice Schwab, NP  amLODipine (NORVASC) 5 MG tablet Take 5 mg by mouth daily. 08/03/21   [provider]  atorvastatin (LIPITOR) 80 MG tablet Take 1 tablet (80 mg total) by mouth daily. Patient taking differently: Take 80 mg by mouth at bedtime. 05/04/21 04/29/22  Love, Ivan Anchors, PA-C  calcium acetate (PHOSLO) 667 MG capsule Take 2 capsules (1,334 mg total) by mouth 3 (three) times daily with meals. 05/04/21   Love, Ivan Anchors, PA-C  carvedilol (COREG) 25 MG tablet Take 25 mg by mouth 2 (two) times daily. 06/16/21    [provider]  clonazePAM (KLONOPIN) 0.5 MG disintegrating tablet Take 1 tablet (0.5 mg total) by mouth daily as needed for seizure (for seizures). 07/30/21 08/29/21  Nita Sells, MD  cloNIDine (CATAPRES) 0.1 MG tablet Take 1 tablet (0.1 mg total) by mouth 3 (three) times daily as needed (systolic blood pressure (top number) is Greater than 200 or DBP (lower number)is Greater than 110). 08/10/21 08/11/22  Swinyer, Lanice Schwab, NP  clopidogrel (PLAVIX) 75 MG tablet Take 1 tablet (75 mg total) by mouth daily. 05/04/21   Love, Ivan Anchors, PA-C  dorzolamide-timolol (COSOPT) 22.3-6.8 MG/ML ophthalmic solution Place 1 drop into the right eye 2 (two) times daily.    [provider]  doxycycline (VIBRAMYCIN) 100 MG capsule Take 1 capsule (100 mg total) by mouth 2 (two) times daily. 08/26/21   Kris Hartmann, NP  ELIQUIS 2.5 MG TABS tablet Take 2 tablets (5 mg total) by mouth 2 (two) times daily. 07/30/21   Nita Sells, MD  erythromycin ophthalmic ointment Place 1 Application into both eyes daily. 06/19/21   [provider]  HYDROcodone-acetaminophen (NORCO/VICODIN) 5-325 MG tablet Take 2 tablets by mouth every 6 (six) hours as needed for moderate pain. 08/13/21 08/13/22  Algernon Huxley, MD  hydrocortisone 2.5 % cream Place rectally. 06/04/21   [provider]  lactobacillus (FLORANEX/LACTINEX) PACK Take 1 packet (1 g total) by mouth 3 (three) times daily with meals. 05/04/21   Love, Ivan Anchors, PA-C  levETIRAcetam (KEPPRA) 250 MG tablet Take 1 tablet (  250 mg total) by mouth Every Tuesday,Thursday,and Saturday with dialysis. 07/30/21   Nita Sells, MD  levETIRAcetam (KEPPRA) 500 MG tablet Take 1 tablet (500 mg total) by mouth 2 (two) times daily. 07/30/21   Nita Sells, MD  melatonin 5 MG TABS Take 1 tablet (5 mg total) by mouth at bedtime as needed. 05/04/21   Love, Ivan Anchors, PA-C  mupirocin ointment (BACTROBAN) 2 % Apply 1 Application topically 2 (two) times  daily. 08/20/21   Kris Hartmann, NP  neomycin-polymyxin b-dexamethasone (MAXITROL) 3.5-10000-0.1 OINT Place 1 application. into the left eye in the morning and at bedtime. 06/19/21   [provider]  nitroGLYCERIN (NITROSTAT) 0.4 MG SL tablet Place 1 tablet (0.4 mg total) under the tongue every 5 (five) minutes as needed for chest pain. 12/09/20 12/09/21  Johnson, Clanford L, MD  prednisoLONE acetate (PRED FORTE) 1 % ophthalmic suspension Place 1 drop into the left eye every 4 (four) hours. 05/04/21   Love, Ivan Anchors, PA-C  tamsulosin (FLOMAX) 0.4 MG CAPS capsule Take 0.4 mg by mouth daily. 05/19/21   [provider]      Allergies    Promethazine, Promethazine hcl, Other, and Malt    Review of Systems   Review of Systems  Constitutional:  Negative for chills and fever.  Respiratory:  Negative for shortness of breath.   Cardiovascular:  Negative for chest pain.  Gastrointestinal:  Negative for abdominal pain.  Neurological:  Positive for weakness. Negative for headaches.    Physical Exam Updated Vital Signs BP (!) 149/50   Pulse 78   Temp (S) 100 F (37.8 C) (Oral)   Resp 12   Ht 6\' 3"  (1.905 m)   Wt 84.8 kg   SpO2 98%   BMI 23.37 kg/m  Physical Exam Vitals and nursing note reviewed.  Constitutional:      General: He is not in acute distress.    Appearance: He is not ill-appearing.  HENT:     Head: Normocephalic and atraumatic.     Nose: No congestion.  Eyes:     Conjunctiva/sclera: Conjunctivae normal.     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     Pulses: Normal pulses.     Heart sounds: No murmur heard.    No friction rub. No gallop.  Pulmonary:     Effort: No respiratory distress.     Breath sounds: No wheezing, rhonchi or rales.     Comments: Dialysis catheter on the right upper chest no evidence of infection noted Abdominal:     Palpations: Abdomen is soft.     Tenderness:  There is no abdominal tenderness. There is no right CVA tenderness or left CVA tenderness.  Skin:    General: Skin is warm and dry.     Comments: Fistula on both his arms, palpable thrill bilaterally, no overlying skin changes.  Neurological:     Mental Status: He is alert.     Comments: Cranial nerves II through XII grossly intact no difficulty with word finding following two-step commands no unilateral weakness present.  Psychiatric:        Mood and Affect: Mood normal.     ED Results / Procedures / Treatments   Labs (all labs ordered are listed, but only abnormal results are displayed) Labs Reviewed  CBC - Abnormal; Notable for the following components:      Result Value   Hemoglobin 9.8 (*)  HCT 32.6 (*)    MCV 74.3 (*)    MCH 22.3 (*)    RDW 21.9 (*)    Platelets 140 (*)    All other components within normal limits  COMPREHENSIVE METABOLIC PANEL - Abnormal; Notable for the following components:   Potassium 3.4 (*)    BUN 28 (*)    Creatinine, Ser 7.69 (*)    Calcium 8.4 (*)    Albumin 3.3 (*)    AST 12 (*)    GFR, Estimated 8 (*)    All other components within normal limits  BRAIN NATRIURETIC PEPTIDE - Abnormal; Notable for the following components:   B Natriuretic Peptide 1,416.0 (*)    All other components within normal limits  PROTIME-INR - Abnormal; Notable for the following components:   Prothrombin Time 19.0 (*)    INR 1.6 (*)    All other components within normal limits  APTT - Abnormal; Notable for the following components:   aPTT 37 (*)    All other components within normal limits  LIPID PANEL - Abnormal; Notable for the following components:   HDL 39 (*)    All other components within normal limits  TROPONIN I (HIGH SENSITIVITY) - Abnormal; Notable for the following components:   Troponin I (High Sensitivity) 44 (*)    All other components within normal limits  TROPONIN I (HIGH SENSITIVITY) - Abnormal; Notable for the following components:    Troponin I (High Sensitivity) 44 (*)    All other components within normal limits  RESP PANEL BY RT-PCR (FLU A&B, COVID) ARPGX2  MAGNESIUM  HEMOGLOBIN A1C    EKG None  Radiology CT ANGIO HEAD NECK W WO CM (CODE STROKE)  Result Date: 11/21/2021 CLINICAL DATA:  Provided history: Neuro deficit, acute, stroke suspected. EXAM: CT ANGIOGRAPHY HEAD AND NECK TECHNIQUE: Multidetector CT imaging of the head and neck was performed using the standard protocol during bolus administration of intravenous contrast. Multiplanar CT image reconstructions and MIPs were obtained to evaluate the vascular anatomy. Carotid stenosis measurements (when applicable) are obtained utilizing NASCET criteria, using the distal internal carotid diameter as the denominator. RADIATION DOSE REDUCTION: This exam was performed according to the departmental dose-optimization program which includes automated exposure control, adjustment of the mA and/or kV according to patient size and/or use of iterative reconstruction technique. CONTRAST:  118mL OMNIPAQUE IOHEXOL 350 MG/ML SOLN COMPARISON:  Performed earlier today 11/21/2021. CT angiogram head/neck 04/21/2021. FINDINGS: CTA NECK FINDINGS Aortic arch: Standard aortic branching. Atherosclerotic plaque within the visualized aortic arch and proximal major branch vessels of the neck. No hemodynamically significant innominate or proximal subclavian artery stenosis. Right carotid system: CCA and ICA patent within the neck without stenosis. Minimal atherosclerotic about the carotid bifurcation. Left carotid system: CCA and ICA patent within the neck without stenosis. Minimal atherosclerotic plaque of the carotid bifurcation Vertebral arteries: Vertebral arteries patent within the neck. The right vertebral artery is dominant. Nonstenotic calcified plaque within the proximal right vertebral artery. Skeleton: Cervical spondylosis, greatest at C3-C4. No acute fracture or aggressive osseous lesion. Other  neck: Subcentimeter thyroid nodules, not meeting consensus criteria for ultrasound follow-up based on size. No follow-up imaging recommended. Reference: J Am Coll Radiol. 2015 Feb;12(2): 143-50. Generalized soft tissue edema within the neck. Upper chest: Clustered small nodules within the left lung apex. Review of the MIP images confirms the above findings CTA HEAD FINDINGS Anterior circulation: An ICA nonstenotic atherosclerotic plaque within both vessels. The M1 middle cerebral arteries are patent. No M2 proximal  branch occlusion or high-grade proximal stenosis. The anterior cerebral arteries are patent. No intracranial aneurysm is identified. Posterior circulation: The intracranial vertebral arteries are patent. Atherosclerotic plaque within the intracranial right vertebral artery with no more than mild stenosis. The basilar artery is patent. The posterior cerebral arteries are patent. No evidence of an aneurysm or AVM within the posterior fossa. Venous sinuses: Within the limitations of contrast timing, no convincing thrombus. Anatomic variants: As described Review of the MIP images confirms the above findings IMPRESSION: CTA neck: 1. The common carotid and internal carotid arteries are patent within the neck without stenosis. Minimal atherosclerotic plaque about both carotid bifurcations. 2. Cerebral arteries patent within the neck. Nonstenotic plaque within the proximal right vertebral artery. 3.  Aortic Atherosclerosis (ICD10-I70.0). 4. Clustered small nodules within the left lung apex. Findings may reflect infectious/inflammatory process. However, pulmonary neoplasm cannot be excluded. A dedicated chest CT is recommended for complete evaluation. CTA head: 1. No evidence of an aneurysm or AVM within the posterior fossa. 2. No intracranial large vessel occlusion or proximal high-grade arterial stenosis identified. Mild atherosclerotic disease, as described. Electronically Signed   By: Kellie Simmering D.O.   On:  11/21/2021 16:17   CT Head Wo Contrast  Result Date: 11/21/2021 CLINICAL DATA:  Transient ischemic attack. Additional history provided: Generalized weakness for the past week. EXAM: CT HEAD WITHOUT CONTRAST TECHNIQUE: Contiguous axial images were obtained from the base of the skull through the vertex without intravenous contrast. RADIATION DOSE REDUCTION: This exam was performed according to the departmental dose-optimization program which includes automated exposure control, adjustment of the mA and/or kV according to patient size and/or use of iterative reconstruction technique. COMPARISON:  Head CT 07/26/2021.  Brain MRI 06/23/2021. FINDINGS: Brain: No age advanced or lobar predominant parenchymal atrophy. Acute hemorrhage within the inferior aspect of the fourth ventricle, extending inferiorly through the foramen Magendie, and measuring 1.4 x 1.5 x 3.1 cm (for instance as seen on series 6, image 34). No evidence of obstructive hydrocephalus at this time. 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 Mild multifocal T2 FLAIR hyperintense signal abnormality within the cerebral white matter, nonspecific but compatible with chronic small vessel disease. No acute demarcated cortical infarct. No evidence of an intracranial mass. No midline shift. Vascular: No hyperdense vessel. Atherosclerotic calcifications. Skull: No fracture or aggressive osseous lesion. Sinuses/Orbits: Absent left globe. No significant paranasal sinus disease at the imaged levels. These results were called by telephone at the time of interpretation on 11/21/2021 at 2:18 pm to provider Dr. Roderic Palau, who verbally acknowledged these results. IMPRESSION: 1.4 x 1.5 x 3.1 cm focus of acute hemorrhage within the inferior aspect of the fourth ventricle, and extending inferiorly through the foramen of foramen of Magendie. No evidence of obstructive hydrocephalus at this time. This hemorrhage  is indeterminate in etiology, and a CTA of the head may be obtained to help exclude an intracranial aneurysm or AVM. 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 Electronically Signed   By: Kellie Simmering D.O.   On: 11/21/2021 14:20   DG Chest Port 1 View  Result Date: 11/21/2021 CLINICAL DATA:  Shortness of breath and weakness for 1 week. EXAM: PORTABLE CHEST 1 VIEW COMPARISON:  Chest radiograph 10/24/2021 and earlier FINDINGS: Of note, the patient is mildly rotated on this portable examination. Stable enlarged cardiac silhouette. Right IJ central venous catheter tip projects at the level of the superior cavoatrial junction. No focal consolidation,  pleural effusion, or pneumothorax. IMPRESSION: 1. Stable cardiomegaly. 2. Lungs are clear. Electronically Signed   By: Ileana Roup M.D.   On: 11/21/2021 11:32    Procedures .Critical Care  Performed by: Marcello Fennel, PA-C Authorized by: Marcello Fennel, PA-C   Critical care provider statement:    Critical care time (minutes):  50   Critical care time was exclusive of:  Separately billable procedures and treating other patients   Critical care was necessary to treat or prevent imminent or life-threatening deterioration of the following conditions:  CNS failure or compromise   Critical care was time spent personally by me on the following activities:  Development of treatment plan with patient or surrogate, discussions with consultants, evaluation of patient's response to treatment, examination of patient, ordering and review of laboratory studies, ordering and review of radiographic studies, ordering and performing treatments and interventions, pulse oximetry, re-evaluation of patient's condition and review of old charts   I assumed direction of critical care for this patient from another provider in my specialty: no     Care discussed with: admitting provider       Medications Ordered in  ED Medications  clevidipine (CLEVIPREX) infusion 0.5 mg/mL (8 mg/hr Intravenous Rate/Dose Change 11/21/21 1735)  acetaminophen (TYLENOL) tablet 650 mg (has no administration in time range)  famotidine (PEPCID) tablet 20 mg (has no administration in time range)  cloNIDine (CATAPRES) tablet 0.1 mg (0.1 mg Oral Given 11/21/21 1335)  coag fact Xa recombinant (ANDEXXA) high dose infusion 1800 mg (0 mg Intravenous Stopped 11/21/21 1746)  iohexol (OMNIPAQUE) 350 MG/ML injection 100 mL (100 mLs Intravenous Contrast Given 11/21/21 1517)    ED Course/ Medical Decision Making/ A&P                           Medical Decision Making Amount and/or Complexity of Data Reviewed Labs: ordered. Radiology: ordered.  Risk OTC drugs. Prescription drug management. Decision regarding hospitalization.   This patient presents to the ED for concern of not feeling well, this involves an extensive number of treatment options, and is a complaint that carries with it a high risk of complications and morbidity.  The differential diagnosis includes metabolic derailment, sepsis, intracranial head bleed,    Additional history obtained:  Additional history obtained from wife at bedside External records from outside source obtained and reviewed including Previous ER notes   Co morbidities that complicate the patient evaluation  A-fib currently on Eliquis end-stage renal disease  Social Determinants of Health:  N/A    Lab Tests:  I Ordered, and personally interpreted labs.  The pertinent results include: CBC shows microcytic anemia hemoglobin 9.8, thrombocytopenia platelet of 140, CMP shows potassium 3.4 BUN 28 creatinine 7, AST 12, GFR 8, BNP 1416 magnesium 1.9, for troponin is 44   Imaging Studies ordered:  I ordered imaging studies including chest, CT head, CTA of head and neck I independently visualized and interpreted imaging which showed chest x-ray negative, CT head reveals acute hemorrhage within  the inferior aspect of the fourth ventricle. I agree with the radiologist interpretation   Cardiac Monitoring:  The patient was maintained on a cardiac monitor.  I personally viewed and interpreted the cardiac monitored which showed an underlying rhythm of: Sinus without signs of ischemia   Medicines ordered and prescription drug management:  I ordered medication including clonidine  I have reviewed the patients home medicines and have made adjustments as needed  Critical Interventions:  Patient is an acute head bleed, was given reversal of anticoag's, and was started on Cleviprex Reassessed, BP has improved, he has no complaints, will continue to monitor   Reevaluation:  Presents with weakness, he had a benign physical exam, will obtain lab work imaging and reassess  Due to the patient's BP remains elevated, despite taking his at home medications, will provide with home dose of clonidine, will also add on CT head since his BP is high and is on a blood thinner.  Patient has acute head bleed will consult with neurology.  CTA head neck negative for acute findings, patient has remained stable, will transfer to [facility], ED, ED for further evaluation   Consultations Obtained:  I requested consultation with the spoke with Dr. Lorrin Goodell neurology,  and discussed lab and imaging findings as well as pertinent plan - they recommend: stat CTA head and neck, obtain INR, APTT and continue with current treatment.  Transfer to [facility] and will be admitted to neuro ICU, please reviewed his consult note for further instruction. Spoke with Dr. Tinnie Gens at [facility], ER he has accept the patient in transfer.    Test Considered:  N/A   Rule out low suspicion for internal mass as CT imaging is negative imaging exam for this.  Low suspicion for CVA he has no focal deficit present my exam.  Low suspicion for dissection of the vertebral or carotid artery as presentation atypical of etiology  CTA is negative.  Low suspicion for meningitis as she has no meningeal sign present.  I have low suspicion for metabolic derailments as lab work is unremarkable.  I low suspicion for congestive heart failure as lung sounds are clear bilaterally, no evidence of peripheral edema noted on my exam, x-rays negative for pleural effusions.  I have low suspicion for ACS denies any chest pain or shortness of breath EKG without signs of ischemia, first troponin slightly elevated but this appears to be his baseline suspect this is demand ischemia as well as poor excretion due to kidney functions.     Dispostion and problem list  After consideration of the diagnostic results and the patients response to treatment, I feel that the patent would benefit from transfer.  Hypertension emergency-currently on Cleviprex BP must remain between 005-110 systolic will need further monitoring. Intracranial head bleed-currently on Andexxa, will need frequent neurochecks, repeat CT scan 6 hours, will need MRI with and without contrast, and admission to neuro ICU for further evaluation. CT of chest reveals possible nodules noted in the left lung field positive for infectious/reactive etiology, patient's oral temp is slightly elevated at 100, will provide Tylenol, add on respiratory panel, as well as obtain CT chest for further evaluation.  But this time I doubt pneumonia is endorsing any URI-like symptoms I suspect this is more a viral etiology.             Final Clinical Impression(s) / ED Diagnoses Final diagnoses:  Intracranial bleed [facility])  Hypertensive emergency    Rx / DC Orders ED Discharge Orders     None         Marcello Fennel, PA-C 11/21/21 1703    Marcello Fennel, PA-C 11/21/21 Mirian Capuchin    Milton Ferguson, MD 11/26/21 [phone]

## 2021-11-21 NOTE — ED Notes (Signed)
Pt back from CT, neurologist at bedside.

## 2021-11-21 NOTE — ED Notes (Signed)
Difficulty getting any information from patient other than he feels generally weak after dialysis and thinks he is dehydrated. Patient does report vomiting once "a couple days ago." Denies any nausea, vomiting, or diarrhea today. Patient does report "foggy vision" from right eye that started today. 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 Patient denies any neurological deficits. Poor historian. EDPa in room trying to assess patient.

## 2021-11-21 NOTE — ED Notes (Signed)
Patient transported to CT 

## 2021-11-21 NOTE — ED Provider Notes (Signed)
7:00 PM Patient was transferred from [facility] with a head bleed on eliquis.  BP is now under control.  Has a low-grade fever though just received Tylenol within the last hour at [facility].  Weakly moves all 4 extremities.  Is protecting his airway and alert.  Will consult neurology.  Dr. Leonel Ramsay will admit   [name] Gambler, MD 11/21/21 [phone]

## 2021-11-21 NOTE — ED Notes (Signed)
Patient has occasional hiccup. Patient requesting something for indigestion. Per patient has indigestion often and take omeprazole. EDPa made aware.

## 2021-11-21 NOTE — ED Notes (Signed)
Report given to [facility].

## 2021-11-21 NOTE — ED Notes (Signed)
[facility] in room placing patient on their monitor and assessing patient.

## 2021-11-21 NOTE — ED Triage Notes (Signed)
Patient reports generalized weakness for the past week. States that he feels like he is dehydrated and needs fluids. Family reports this happens frequently. Dialysis patient with last treatment this am. Denies N/V/D.

## 2021-11-21 NOTE — H&P (Signed)
Neurology H&P  CC: General weakness  History is obtained from: Patient, family  HPI: [name]. is a 51 y.o. male with a history of stroke, atrial fibrillation, NSTEMI and CHF with chronic on Plavix and apixaban who has been having general malaise for the past few days.  Also a cough.  He has had some nausea and vomiting over the past 2 days, but only mild headache.  When he presented to the emergency department, his blood pressure was in the 200s.  Given the nausea and vomiting and severe hypertension, the ER physician at [facility] decided to perform a head CT which demonstrates a fourth ventricle bleed.     LKW: Unclear tpa given?: No, ICH IR Thrombectomy? No, ICH NIHSS: 0 ICH score: 2 Past Medical History:  Diagnosis Date   Anxiety    a.) on BZO (clonazepam) PRN   Aortic dilatation ([place]) 11/25/2020   a.) TTE 11/25/2020: Ao root measured 38 mm. b.) TTE 04/22/2021: Ao root measured 44 mm.   Atrial fibrillation ([place])    a.) CHA2DS2-VASc = 6 (CHF, HTN, CVA x2, prior MI, T2DM). b.) rate/rhythm maintained on oral amiodarone + carvedilol; chronically anticoagulated using dose reduced apixaban + clopidogrel   BPH (benign prostatic hyperplasia)    Cardiac arrest ([place]) 07/26/2021   a.) in setting of hyperkalemia, NSTEMI, and seizure following missed HD; pulseless and apneic --> required 1 round of CPR prior to ROSC   Cellulitis    CHF (congestive heart failure) ([place]) 07/19/2017   a.) TTE 07/19/2017: EF 75%; sev LVH; mild MR/PR/TR; G1DD. b.) TTE 11/25/2020: EF 55-60%; mod LVH; GLS -13.0; mild-mod BAE; triv MR; mild dilated PA; G2DD. c.) TTE 04/22/2021: EF 55-60%; sev LVH; sev LAE, triv MR; G1DD.   Chronic diarrhea    Chronic pain of both ankles    Coronary artery disease 02/26/2020   a.) anterior STEMI 02/26/2020 --> LHC/PCI --> EF 65%; 90% mLAD (3.5 x 30 mm Resolyte Onyx DES x 1). b.) NSTEMI 07/26/2021   Diabetes mellitus, type II ([place])    Diabetic neuropathy (HCC)     Esophagitis    ESRD (end stage renal disease) on dialysis ([place])    a.) [facility]; T-Th-Sat   Frequent falls    Gait instability    Gastritis    Gastroparesis    GERD (gastroesophageal reflux disease)    H/O enucleation of left eyeball    Heart palpitations    History of kidney stones    HLD (hyperlipidemia)    Hypertension    Insomnia    a.) uses melatonin PRN   Long term current use of anticoagulant    a.) dose reduced apixaban   Long term current use of antithrombotics/antiplatelets    a.) clopidogrel   Nausea and vomiting in adult    recurrent   NSTEMI (non-ST elevated myocardial infarction) ([name]) 07/26/2021   a.) hyperkalemic from missed HD; seizures; decompensated to cardiac arrest and required CPR x 1 round prior to ROSC.   Seizure ([place])    a.) last 07/26/2021 in setting of missed HD --> hyperkalemic at 6.5 --> pulseless/apneic and required CPR; discharged home on levetiracetam.   ST elevation myocardial infarction (STEMI) of anterior wall ([place]) 02/26/2020   a.) LHC/PCI 02/26/2020 --> EF 65%; LVEDP 11 mmHg; 90% mLAD (3.5 x 20 mm Resolute Onyx DES x 1)   Stroke ([place]) 04/2021   a.) documented as x 3 between 04/2021 and 06/2021     Family History  Problem Relation Age of  Onset   CAD Father    Stroke Father    Diabetes Mellitus II Mother    Kidney failure Mother    Schizophrenia Mother      Social History:  reports that he has never smoked. He has never used smokeless tobacco. He reports that he does not drink alcohol and does not use drugs.   Prior to Admission medications   Medication Sig Start Date End Date Taking? Authorizing Provider  acetaminophen (TYLENOL) 325 MG tablet Take 1-2 tablets (325-650 mg total) by mouth every 4 (four) hours as needed for mild pain. 05/04/21   Love, Ivan Anchors, PA-C  amiodarone (PACERONE) 200 MG tablet Take 1 tablet (200 mg total) by mouth every morning. 08/10/21 08/11/22  Swinyer, Lanice Schwab, NP  amLODipine (NORVASC) 5 MG tablet Take 5 mg  by mouth daily. 08/03/21   [provider]  atorvastatin (LIPITOR) 80 MG tablet Take 1 tablet (80 mg total) by mouth daily. Patient taking differently: Take 80 mg by mouth at bedtime. 05/04/21 04/29/22  Love, Ivan Anchors, PA-C  calcium acetate (PHOSLO) 667 MG capsule Take 2 capsules (1,334 mg total) by mouth 3 (three) times daily with meals. 05/04/21   Love, Ivan Anchors, PA-C  carvedilol (COREG) 25 MG tablet Take 25 mg by mouth 2 (two) times daily. 06/16/21   [provider]  clonazePAM (KLONOPIN) 0.5 MG disintegrating tablet Take 1 tablet (0.5 mg total) by mouth daily as needed for seizure (for seizures). 07/30/21 08/29/21  Nita Sells, MD  cloNIDine (CATAPRES) 0.1 MG tablet Take 1 tablet (0.1 mg total) by mouth 3 (three) times daily as needed (systolic blood pressure (top number) is Greater than 200 or DBP (lower number)is Greater than 110). 08/10/21 08/11/22  Swinyer, Lanice Schwab, NP  clopidogrel (PLAVIX) 75 MG tablet Take 1 tablet (75 mg total) by mouth daily. 05/04/21   Love, Ivan Anchors, PA-C  dorzolamide-timolol (COSOPT) 22.3-6.8 MG/ML ophthalmic solution Place 1 drop into the right eye 2 (two) times daily.    [provider]  doxycycline (VIBRAMYCIN) 100 MG capsule Take 1 capsule (100 mg total) by mouth 2 (two) times daily. 08/26/21   Kris Hartmann, NP  ELIQUIS 2.5 MG TABS tablet Take 2 tablets (5 mg total) by mouth 2 (two) times daily. 07/30/21   Nita Sells, MD  erythromycin ophthalmic ointment Place 1 Application into both eyes daily. 06/19/21   [provider]  HYDROcodone-acetaminophen (NORCO/VICODIN) 5-325 MG tablet Take 2 tablets by mouth every 6 (six) hours as needed for moderate pain. 08/13/21 08/13/22  Algernon Huxley, MD  hydrocortisone 2.5 % cream Place rectally. 06/04/21   [provider]  lactobacillus (FLORANEX/LACTINEX) PACK Take 1 packet (1 g total) by mouth 3 (three) times daily with meals. 05/04/21   Love, Ivan Anchors, PA-C  levETIRAcetam (KEPPRA)  250 MG tablet Take 1 tablet (250 mg total) by mouth Every Tuesday,Thursday,and Saturday with dialysis. 07/30/21   Nita Sells, MD  levETIRAcetam (KEPPRA) 500 MG tablet Take 1 tablet (500 mg total) by mouth 2 (two) times daily. 07/30/21   Nita Sells, MD  melatonin 5 MG TABS Take 1 tablet (5 mg total) by mouth at bedtime as needed. 05/04/21   Love, Ivan Anchors, PA-C  mupirocin ointment (BACTROBAN) 2 % Apply 1 Application topically 2 (two) times daily. 08/20/21   Kris Hartmann, NP  neomycin-polymyxin b-dexamethasone (MAXITROL) 3.5-10000-0.1 OINT Place 1 application. into the left eye in the morning and at bedtime. 06/19/21   [provider]  nitroGLYCERIN (NITROSTAT) 0.4 MG SL tablet Place 1 tablet (0.4 mg total) under the tongue every 5 (five) minutes as needed for chest pain. 12/09/20 12/09/21  [place], Clanford L, MD  prednisoLONE acetate (PRED FORTE) 1 % ophthalmic suspension Place 1 drop into the left eye every 4 (four) hours. 05/04/21   Love, Ivan Anchors, PA-C  tamsulosin (FLOMAX) 0.4 MG CAPS capsule Take 0.4 mg by mouth daily. 05/19/21   [provider]     Exam: Current vital signs: BP (!) 161/56   Pulse 73   Temp 100 F (37.8 C) (Oral)   Resp 18   Ht 6\' 3"  (1.905 m)   Wt 84.8 kg   SpO2 98%   BMI 23.37 kg/m    Physical Exam  Constitutional: Appears well-developed and well-nourished.  Psych: Affect appropriate to situation Eyes: No scleral injection HENT: No OP obstrucion Head: Normocephalic.  Cardiovascular: Normal rate and regular rhythm.  Respiratory: Effort normal, nonlabored breathing GI: Soft.  No distension. There is no tenderness.  Skin: WDI  Neuro: Mental Status: Patient is awake, alert, oriented to person, place, month, year, and situation. Patient is able to give a clear and coherent history. No signs of aphasia or neglect Cranial Nerves: II: Visual Fields are full. Pupils are equal, round, and reactive to light.   III,IV, VI: EOMI  without ptosis or diplopia.  V: Facial sensation is symmetric to temperature VII: Facial movement is symmetric.  VIII: hearing is intact to voice X: Uvula is midline and palate elevates symmetrically XI: Shoulder shrug is symmetric. XII: tongue is midline without atrophy or fasciculations.  Motor: Tone is normal. Bulk is normal. 5/5 strength was present in all four extremities.  Sensory: Sensation is symmetric to light touch and temperature in the arms and legs. Cerebellar: FNF and HKS are intact bilaterally  I have reviewed labs in epic and the pertinent results are: Results for orders placed or performed during the hospital encounter of 11/21/21 (from the past 24 hour(s))  CBC     Status: Abnormal   Collection Time: 11/21/21 11:05 AM  Result Value Ref Range   WBC 6.6 4.0 - 10.5 K/uL   RBC 4.39 4.22 - 5.81 MIL/uL   Hemoglobin 9.8 (L) 13.0 - 17.0 g/dL   HCT 32.6 (L) 39.0 - 52.0 %   MCV 74.3 (L) 80.0 - 100.0 fL   MCH 22.3 (L) 26.0 - 34.0 pg   MCHC 30.1 30.0 - 36.0 g/dL   RDW 21.9 (H) 11.5 - 15.5 %   Platelets 140 (L) 150 - 400 K/uL   nRBC 0.0 0.0 - 0.2 %  Comprehensive metabolic panel     Status: Abnormal   Collection Time: 11/21/21 11:17 AM  Result Value Ref Range   Sodium 139 135 - 145 mmol/L   Potassium 3.4 (L) 3.5 - 5.1 mmol/L   Chloride 103 98 - 111 mmol/L   CO2 25 22 - 32 mmol/L   Glucose, Bld 88 70 - 99 mg/dL   BUN 28 (H) 6 - 20 mg/dL   Creatinine, Ser 7.69 (H) 0.61 - 1.24 mg/dL   Calcium 8.4 (L) 8.9 - 10.3 mg/dL   Total Protein 6.5 6.5 - 8.1 g/dL   Albumin 3.3 (L) 3.5 - 5.0 g/dL   AST 12 (L) 15 - 41 U/L   ALT 8 0 - 44 U/L   Alkaline Phosphatase 110 38 - 126 U/L   Total Bilirubin 1.2 0.3 - 1.2 mg/dL   GFR, Estimated 8 (L) >60  mL/min   Anion gap 11 5 - 15  Magnesium     Status: None   Collection Time: 11/21/21 11:17 AM  Result Value Ref Range   Magnesium 1.9 1.7 - 2.4 mg/dL  Brain natriuretic peptide     Status: Abnormal   Collection Time: 11/21/21 11:17 AM   Result Value Ref Range   B Natriuretic Peptide 1,416.0 (H) 0.0 - 100.0 pg/mL  Troponin I (High Sensitivity)     Status: Abnormal   Collection Time: 11/21/21 11:17 AM  Result Value Ref Range   Troponin I (High Sensitivity) 44 (H) <18 ng/L  Lipid panel     Status: Abnormal   Collection Time: 11/21/21 11:17 AM  Result Value Ref Range   Cholesterol 94 0 - 200 mg/dL   Triglycerides 106 <150 mg/dL   HDL 39 (L) >40 mg/dL   Total CHOL/HDL Ratio 2.4 RATIO   VLDL 21 0 - 40 mg/dL   LDL Cholesterol 34 0 - 99 mg/dL  Troponin I (High Sensitivity)     Status: Abnormal   Collection Time: 11/21/21  1:25 PM  Result Value Ref Range   Troponin I (High Sensitivity) 44 (H) <18 ng/L  Protime-INR     Status: Abnormal   Collection Time: 11/21/21  3:37 PM  Result Value Ref Range   Prothrombin Time 19.0 (H) 11.4 - 15.2 seconds   INR 1.6 (H) 0.8 - 1.2  APTT     Status: Abnormal   Collection Time: 11/21/21  3:37 PM  Result Value Ref Range   aPTT 37 (H) 24 - 36 seconds     I have reviewed the images obtained: CT head-fourth ventricular hemorrhage  Primary Diagnosis:  Nontraumatic intracerebral hemorrhage, intraventricular  Secondary Diagnosis: Hypertension Emergency (SBP > 180 or DBP > 120 & end organ damage), Heart failure, unspecified, Paroxysmal atrial fibrillation, and ESRD, hypokalemia   Impression: 51 year old male with acute intraventricular hemorrhage.  He has had symptoms generalized and nausea and vomiting for a couple of days and it is very possible that these are related to his hemorrhage, but given his low-grade fevers and cough, I wonder if he might have a mild URI responsible for the symptoms.  I think that it is unclear exactly when this hemorrhage occurred, and therefore especially given how high his blood pressures were I think observation in the unit overnight with repeat imaging is prudent given the location and size.  He does have a pericardial effusion on chest CT, I discussed  with cardiology, this appears to be chronic and we will repeat echo in the morning to ensure stability.  Plan: ICH 1) Admit to ICU 2) no antiplatelets or anticoagulants 3) blood pressure control with goal systolic 269 - 485, will use Cleviprex 4) Frequent neuro checks 5) If symptoms worsen or there is decreased mental status, repeat stat head CT, otherwise repeat in a.m. 6) echo  7) PT,OT,ST  ESRD - nephrology has been consulted for dialysis while here - BMP, CBC in the morning  Low-grade fevers -Blood cultures -CXR without evidence of pneumonia  This patient is critically ill and at significant risk of neurological worsening, death and care requires constant monitoring of vital signs, hemodynamics,respiratory and cardiac monitoring, neurological assessment, discussion with family, other specialists and medical decision making of high complexity. I spent 50 minutes of neurocritical care time  in the care of  this patient. This was time spent independent of any time provided by nurse practitioner or PA.  Roland Rack, MD [facility] [phone]  If 7pm- 7am, please page neurology on call as listed in [place].

## 2021-11-21 NOTE — ED Notes (Signed)
Coag fact X brought from pharmacy. Secondary IV access to be gained for CT angio with contrast and then IV med administration.

## 2021-11-21 NOTE — ED Notes (Signed)
Beeped to [phone].S. Lorrin Goodell

## 2021-11-21 NOTE — Plan of Care (Signed)
Brief Neuro Note:  Briefly, [name]. Is a 51 y.o. male with ESRD on HD, afibb on eliquis who presents with generalized weakness x 1 week. He was in hypertensive emergency with SBP in 200s.   Plan: - Admit to [facility] Neuro ICU under neurology service. - STAT CT Angio head and neck(ordered as code stroke imaging) - Eliquis reversed with Andexxa - Get PT/INR, APTT - Stability scan in 6 hours or STAT with any neurological decline - Frequent neuro checks; q26min for 1 hour, then q1hour - No antiplatelets or anticoagulants due to [place] - SCD for DVT prophylaxis, pharmacological DVT ppx at 24 hours if ICH is stable - Blood pressure control with goal systolic 482 - 500, cleverplex and labetalol PRN - Stroke labs, HgbA1c, fasting lipid panel - MRI brain with and without contrast when stabilized to evaluate for underlying mass - Risk factor modification - Echocardiogram - PT consult, OT consult, Speech consult.  [place] Pager Number [phone]

## 2021-11-21 NOTE — ED Notes (Signed)
Patient transferred to [place]. Patient requesting something for indigestion agin. EDPa made aware again and is to place order.

## 2021-11-21 NOTE — ED Notes (Signed)
Patient transported to MRI 

## 2021-11-21 NOTE — ED Notes (Signed)
Patient to CT at this time with RN.

## 2021-11-22 ENCOUNTER — Inpatient Hospital Stay (HOSPITAL_COMMUNITY): Payer: Medicare HMO

## 2021-11-22 DIAGNOSIS — I615 Nontraumatic intracerebral hemorrhage, intraventricular: Secondary | ICD-10-CM

## 2021-11-22 DIAGNOSIS — Z87898 Personal history of other specified conditions: Secondary | ICD-10-CM | POA: Diagnosis not present

## 2021-11-22 DIAGNOSIS — N186 End stage renal disease: Secondary | ICD-10-CM

## 2021-11-22 DIAGNOSIS — I3139 Other pericardial effusion (noninflammatory): Secondary | ICD-10-CM

## 2021-11-22 DIAGNOSIS — I517 Cardiomegaly: Secondary | ICD-10-CM

## 2021-11-22 DIAGNOSIS — Z8673 Personal history of transient ischemic attack (TIA), and cerebral infarction without residual deficits: Secondary | ICD-10-CM

## 2021-11-22 DIAGNOSIS — Z992 Dependence on renal dialysis: Secondary | ICD-10-CM

## 2021-11-22 DIAGNOSIS — I161 Hypertensive emergency: Secondary | ICD-10-CM | POA: Diagnosis not present

## 2021-11-22 LAB — ECHOCARDIOGRAM COMPLETE
AR max vel: 4.7 cm2
AV Area VTI: 4.28 cm2
AV Area mean vel: 4.23 cm2
AV Mean grad: 7 mmHg
AV Peak grad: 10.9 mmHg
Ao pk vel: 1.65 m/s
Area-P 1/2: 2.91 cm2
Height: 75 in
S' Lateral: 2.8 cm
Weight: 2992 oz

## 2021-11-22 LAB — CBC
HCT: 34.2 % — ABNORMAL LOW (ref 39.0–52.0)
Hemoglobin: 10 g/dL — ABNORMAL LOW (ref 13.0–17.0)
MCH: 22.5 pg — ABNORMAL LOW (ref 26.0–34.0)
MCHC: 29.2 g/dL — ABNORMAL LOW (ref 30.0–36.0)
MCV: 76.9 fL — ABNORMAL LOW (ref 80.0–100.0)
Platelets: 120 10*3/uL — ABNORMAL LOW (ref 150–400)
RBC: 4.45 MIL/uL (ref 4.22–5.81)
RDW: 21.9 % — ABNORMAL HIGH (ref 11.5–15.5)
WBC: 7.2 10*3/uL (ref 4.0–10.5)
nRBC: 0 % (ref 0.0–0.2)

## 2021-11-22 LAB — MRSA NEXT GEN BY PCR, NASAL: MRSA by PCR Next Gen: NOT DETECTED

## 2021-11-22 LAB — BASIC METABOLIC PANEL
Anion gap: 15 (ref 5–15)
BUN: 32 mg/dL — ABNORMAL HIGH (ref 6–20)
CO2: 20 mmol/L — ABNORMAL LOW (ref 22–32)
Calcium: 8.7 mg/dL — ABNORMAL LOW (ref 8.9–10.3)
Chloride: 102 mmol/L (ref 98–111)
Creatinine, Ser: 9.54 mg/dL — ABNORMAL HIGH (ref 0.61–1.24)
GFR, Estimated: 6 mL/min — ABNORMAL LOW (ref >60)
Glucose, Bld: 76 mg/dL (ref 70–99)
Potassium: 4 mmol/L (ref 3.5–5.1)
Sodium: 137 mmol/L (ref 135–145)

## 2021-11-22 MED ORDER — CLONIDINE HCL 0.1 MG PO TABS
0.1000 mg | ORAL_TABLET | Freq: Three times a day (TID) | ORAL | Status: DC
  Administered 2021-11-22 – 2021-11-23 (×3): 0.1 mg via ORAL
  Filled 2021-11-22 (×3): qty 1

## 2021-11-22 MED ORDER — AMLODIPINE BESYLATE 10 MG PO TABS
10.0000 mg | ORAL_TABLET | Freq: Every day | ORAL | Status: DC
  Administered 2021-11-23 – 2021-12-05 (×13): 10 mg via ORAL
  Filled 2021-11-22 (×13): qty 1

## 2021-11-22 MED ORDER — PANTOPRAZOLE SODIUM 40 MG PO TBEC
40.0000 mg | DELAYED_RELEASE_TABLET | Freq: Every day | ORAL | Status: DC
  Administered 2021-11-22 – 2021-12-05 (×13): 40 mg via ORAL
  Filled 2021-11-22 (×13): qty 1

## 2021-11-22 NOTE — Consult Note (Signed)
[name]. Admit Date: 11/21/2021 11/22/2021 [name] Requesting Physician:  Leonel Ramsay MD  Reason for Consult:  ESRD comanagement during admission with ICH, HTN  HPI:  33M ESRD THS [facility]  developed several days of malaise, nausea vomiting, mild headache and presented to [facility], ED yesterday after dialysis.  Their work-up included CT of the head identifying fourth ventricular intracerebral hemorrhage and patient transferred to [facility].  Here patient admitted to neurology, repeat MRI confirmed hemorrhage.  Patient admitted to ICU, placed on Cleviprex for blood pressure control.  This morning labs are stable with potassium of 4.0, BUN of 32.  Outpatient dialysis orders not available.  He has a tunneled catheter and recently began cannulating his left upper arm AV fistula.  At time of my evaluation patient without complaint.  Other PMH includes atrial fibrillation on apixaban, CAD on clopidogrel, DM 2 with neuropathy, GERD, hypertension ] ROS Balance of 12 systems is negative w/ exceptions as above  PMH  Past Medical History:  Diagnosis Date   Anxiety    a.) on BZO (clonazepam) PRN   Aortic dilatation ([place]) 11/25/2020   a.) TTE 11/25/2020: Ao root measured 38 mm. b.) TTE 04/22/2021: Ao root measured 44 mm.   Atrial fibrillation ([place])    a.) CHA2DS2-VASc = 6 (CHF, HTN, CVA x2, prior MI, T2DM). b.) rate/rhythm maintained on oral amiodarone + carvedilol; chronically anticoagulated using dose reduced apixaban + clopidogrel   BPH (benign prostatic hyperplasia)    Cardiac arrest ([place]) 07/26/2021   a.) in setting of hyperkalemia, NSTEMI, and seizure following missed HD; pulseless and apneic --> required 1 round of CPR prior to ROSC   Cellulitis    CHF (congestive heart failure) ([place]) 07/19/2017   a.) TTE 07/19/2017: EF 75%; sev LVH; mild MR/PR/TR; G1DD. b.) TTE 11/25/2020: EF 55-60%; mod LVH; GLS -13.0; mild-mod BAE; triv MR; mild dilated PA; G2DD. c.) TTE  04/22/2021: EF 55-60%; sev LVH; sev LAE, triv MR; G1DD.   Chronic diarrhea    Chronic pain of both ankles    Coronary artery disease 02/26/2020   a.) anterior STEMI 02/26/2020 --> LHC/PCI --> EF 65%; 90% mLAD (3.5 x 30 mm Resolyte Onyx DES x 1). b.) NSTEMI 07/26/2021   Diabetes mellitus, type II ([place])    Diabetic neuropathy (HCC)    Esophagitis    ESRD (end stage renal disease) on dialysis ([place])    a.) [facility]; T-Th-Sat   Frequent falls    Gait instability    Gastritis    Gastroparesis    GERD (gastroesophageal reflux disease)    H/O enucleation of left eyeball    Heart palpitations    History of kidney stones    HLD (hyperlipidemia)    Hypertension    Insomnia    a.) uses melatonin PRN   Long term current use of anticoagulant    a.) dose reduced apixaban   Long term current use of antithrombotics/antiplatelets    a.) clopidogrel   Nausea and vomiting in adult    recurrent   NSTEMI (non-ST elevated myocardial infarction) ([place]) 07/26/2021   a.) hyperkalemic from missed HD; seizures; decompensated to cardiac arrest and required CPR x 1 round prior to ROSC.   Seizure ([place])    a.) last 07/26/2021 in setting of missed HD --> hyperkalemic at 6.5 --> pulseless/apneic and required CPR; discharged home on levetiracetam.   ST elevation myocardial infarction (STEMI) of anterior wall ([place]) 02/26/2020   a.) LHC/PCI 02/26/2020 --> EF 65%; LVEDP 11 mmHg;  90% mLAD (3.5 x 20 mm Resolute Onyx DES x 1)   Stroke ([place]) 04/2021   a.) documented as x 3 between 04/2021 and 06/2021   [facility]  Past Surgical History:  Procedure Laterality Date   A/V FISTULAGRAM Left 08/31/2021   Procedure: A/V Fistulagram;  Surgeon: Algernon Huxley, MD;  Location: [place] [facility];  Service: Cardiovascular;  Laterality: Left;   AMPUTATION Left 03/16/2016   Procedure: AMPUTATION DIGIT LEFT HALLUX;  Surgeon: Trula Slade, DPM;  Location: [place];  Service: Podiatry;  Laterality: Left;  can start around 5    AMPUTATION  Right 02/09/2021   Procedure: AMPUTATION DIGIT;  Surgeon: Sherilyn Cooter, MD;  Location: [place];  Service: Orthopedics;  Laterality: Right;   ARTHROSCOPIC REPAIR ACL Left    AV FISTULA PLACEMENT Right 05/31/2019   Procedure: Brachiocephalic AV fistula creation;  Surgeon: Algernon Huxley, MD;  Location: [name];  Service: Vascular;  Laterality: Right;   AV FISTULA PLACEMENT Left 08/13/2021   Procedure: ARTERIOVENOUS (AV) FISTULA CREATION (BRACHIALCEPHALIC);  Surgeon: Algernon Huxley, MD;  Location: [name];  Service: Vascular;  Laterality: Left;   COLONOSCOPY WITH PROPOFOL N/A 10/28/2015   Procedure: COLONOSCOPY WITH PROPOFOL;  Surgeon: Lollie Sails, MD;  Location: [facility] END[facility]COPY;  Service: Endoscopy;  Laterality: N/A;   COLONOSCOPY WITH PROPOFOL N/A 10/29/2015   Procedure: COLONOSCOPY WITH PROPOFOL;  Surgeon: Lollie Sails, MD;  Location: [facility] ENDOSCOPY;  Service: Endoscopy;  Laterality: N/A;   CORONARY/GRAFT ACUTE MI REVASCULARIZATION N/A 02/26/2020   Procedure: Coronary/Graft Acute MI Revascularization;  Surgeon: Jettie Booze, MD;  Location: [place] [facility];  Service: Cardiovascular;  Laterality: N/A;   DIALYSIS/PERMA CATHETER INSERTION Right 04/26/2019   Perm Cath    DIALYSIS/PERMA CATHETER INSERTION N/A 04/26/2019   Procedure: DIALYSIS/PERMA CATHETER INSERTION;  Surgeon: Algernon Huxley, MD;  Location: [place] [facility];  Service: Cardiovascular;  Laterality: N/A;   DIALYSIS/PERMA CATHETER INSERTION N/A 05/25/2021   Procedure: DIALYSIS/PERMA CATHETER INSERTION;  Surgeon: Algernon Huxley, MD;  Location: [place] [facility];  Service: Cardiovascular;  Laterality: N/A;   DIALYSIS/PERMA CATHETER INSERTION N/A 08/31/2021   Procedure: DIALYSIS/PERMA CATHETER INSERTION;  Surgeon: Algernon Huxley, MD;  Location: [place] [facility];  Service: Cardiovascular;  Laterality: N/A;   DIALYSIS/PERMA CATHETER REMOVAL N/A 08/15/2019   Procedure: DIALYSIS/PERMA CATHETER REMOVAL;  Surgeon:  Katha Cabal, MD;  Location: [place] [facility];  Service: Cardiovascular;  Laterality: N/A;   EMBOLIZATION Right 02/06/2021   Procedure: EMBOLIZATION;  Surgeon: Algernon Huxley, MD;  Location: [place] [facility];  Service: Cardiovascular;  Laterality: Right;  Right Upper Extremity Dialysis Access, Permcath Placement   ESOPHAGOGASTRODUODENOSCOPY (EGD) WITH PROPOFOL N/A 12/27/2017   Procedure: ESOPHAGOGASTRODUODENOSCOPY (EGD) WITH PROPOFOL;  Surgeon: Toledo, Benay Pike, MD;  Location: [name] ENDOSCOPY;  Service: Gastroenterology;  Laterality: N/A;   EYE SURGERY     INTRAVASCULAR ULTRASOUND/IVUS N/A 02/26/2020   Procedure: Intravascular Ultrasound/IVUS;  Surgeon: Jettie Booze, MD;  Location: [place] [facility];  Service: Cardiovascular;  Laterality: N/A;   LEFT HEART CATH AND CORONARY ANGIOGRAPHY N/A 02/26/2020   Procedure: LEFT HEART CATH AND CORONARY ANGIOGRAPHY;  Surgeon: Jettie Booze, MD;  Location: [place] [facility];  Service: Cardiovascular;  Laterality: N/A;   PROSTATE SURGERY  2016   TONSILECTOMY/ADENOIDECTOMY WITH MYRINGOTOMY     TONSILLECTOMY     UPPER EXTREMITY ANGIOGRAPHY Right 11/26/2020   Procedure: Upper Extremity Angiography;  Surgeon: Algernon Huxley, MD;  Location:  [place] [facility];  Service: Cardiovascular;  Laterality: Right;   UPPER EXTREMITY ANGIOGRAPHY Right 02/05/2021   Procedure: UPPER EXTREMITY ANGIOGRAPHY;  Surgeon: Algernon Huxley, MD;  Location: [place] [facility];  Service: Cardiovascular;  Laterality: Right;   FH  Family History  Problem Relation Age of Onset   CAD Father    Stroke Father    Diabetes Mellitus II Mother    Kidney failure Mother    Schizophrenia Mother    SH  reports that he has never smoked. He has never used smokeless tobacco. He reports that he does not drink alcohol and does not use drugs. Allergies  Allergies  Allergen Reactions   Promethazine Diarrhea and Other (See Comments)    Muscle cramps   Promethazine Hcl  Other (See Comments)    Cramping all over  Other reaction(s): cramp all over   Other     Other reaction(s): Unknown Other reaction(s): Unknown   Malt Other (See Comments)    Cramping   Home medications Prior to Admission medications   Medication Sig Start Date End Date Taking? Authorizing Provider  acarbose (PRECOSE) 50 MG tablet Take 50 mg by mouth 3 (three) times daily. 10/20/21  Yes [provider]  acetaminophen (TYLENOL) 325 MG tablet Take 1-2 tablets (325-650 mg total) by mouth every 4 (four) hours as needed for mild pain. 05/04/21  Yes Love, Ivan Anchors, PA-C  amiodarone (PACERONE) 200 MG tablet Take 1 tablet (200 mg total) by mouth every morning. 08/10/21 08/11/22 Yes Swinyer, Lanice Schwab, NP  amLODipine (NORVASC) 5 MG tablet Take 5 mg by mouth daily. 08/03/21  Yes [provider]  atorvastatin (LIPITOR) 80 MG tablet Take 1 tablet (80 mg total) by mouth daily. Patient taking differently: Take 80 mg by mouth at bedtime. 05/04/21 04/29/22 Yes Love, Ivan Anchors, PA-C  calcium acetate (PHOSLO) 667 MG capsule Take 2 capsules (1,334 mg total) by mouth 3 (three) times daily with meals. 05/04/21  Yes Love, Ivan Anchors, PA-C  carvedilol (COREG) 25 MG tablet Take 25 mg by mouth 2 (two) times daily. 06/16/21  Yes [provider]  cholestyramine Lucrezia Starch) 4 GM/DOSE powder Take 4 g by mouth 2 (two) times daily. 10/20/21  Yes [provider]  cloNIDine (CATAPRES) 0.1 MG tablet Take 1 tablet (0.1 mg total) by mouth 3 (three) times daily as needed (systolic blood pressure (top number) is Greater than 200 or DBP (lower number)is Greater than 110). 08/10/21 08/11/22 Yes Swinyer, Lanice Schwab, NP  clopidogrel (PLAVIX) 75 MG tablet Take 1 tablet (75 mg total) by mouth daily. 05/04/21  Yes Love, Ivan Anchors, PA-C  dorzolamide-timolol (COSOPT) 22.3-6.8 MG/ML ophthalmic solution Place 1 drop into the right eye 2 (two) times daily.   Yes [provider]  ELIQUIS 2.5 MG TABS tablet Take 2  tablets (5 mg total) by mouth 2 (two) times daily. 07/30/21  Yes Nita Sells, MD  levETIRAcetam (KEPPRA) 250 MG tablet Take 1 tablet (250 mg total) by mouth Every Tuesday,Thursday,and Saturday with dialysis. 07/30/21  Yes Nita Sells, MD  levETIRAcetam (KEPPRA) 500 MG tablet Take 1 tablet (500 mg total) by mouth 2 (two) times daily. 07/30/21  Yes Nita Sells, MD  tamsulosin (FLOMAX) 0.4 MG CAPS capsule Take 0.4 mg by mouth daily. 05/19/21  Yes [provider]  clonazePAM (KLONOPIN) 0.5 MG disintegrating tablet Take 1 tablet (0.5 mg total) by mouth daily as needed for seizure (for seizures). Patient not taking: Reported on 11/22/2021 07/30/21 08/29/21  Nita Sells,  MD  nitroGLYCERIN (NITROSTAT) 0.4 MG SL tablet Place 1 tablet (0.4 mg total) under the tongue every 5 (five) minutes as needed for chest pain. Patient not taking: Reported on 11/21/2021 12/09/20 12/09/21  Murlean Iba, MD  prednisoLONE acetate (PRED FORTE) 1 % ophthalmic suspension Place 1 drop into the left eye every 4 (four) hours. Patient not taking: Reported on 11/21/2021 05/04/21   Bary Leriche, PA-C    Current Medications Scheduled Meds:   stroke: early stages of recovery book   Does not apply Once   amiodarone  200 mg Oral Daily   amLODipine  5 mg Oral Daily   calcium acetate  1,334 mg Oral TID WC   carvedilol  25 mg Oral BID   Chlorhexidine Gluconate Cloth  6 each Topical Daily   dorzolamide-timolol  1 drop Right Eye BID   [START ON 11/24/2021] levETIRAcetam  250 mg Oral Q T,Th,Sa-HD   levETIRAcetam  500 mg Oral BID   pantoprazole (PROTONIX) IV  40 mg Intravenous QHS   prednisoLONE acetate  1 drop Left Eye Q4H while awake   senna-docusate  1 tablet Oral BID   tamsulosin  0.4 mg Oral Daily   Continuous Infusions:  clevidipine 8 mg/hr (11/22/21 0700)   PRN Meds:.acetaminophen **OR** acetaminophen (TYLENOL) oral liquid 160 mg/5 mL **OR** acetaminophen, cloNIDine  CBC Recent Labs   Lab 11/21/21 1105 11/22/21 0054  WBC 6.6 7.2  HGB 9.8* 10.0*  HCT 32.6* 34.2*  MCV 74.3* 76.9*  PLT 140* 034*   Basic Metabolic Panel Recent Labs  Lab 11/21/21 1117 11/22/21 0054  NA 139 137  K 3.4* 4.0  CL 103 102  CO2 25 20*  GLUCOSE 88 76  BUN 28* 32*  CREATININE 7.69* 9.54*  CALCIUM 8.4* 8.7*    Physical Exam  Blood pressure (!) 135/41, pulse 71, temperature 99.5 F (37.5 C), temperature source Axillary, resp. rate 14, height 6\' 3"  (1.905 m), weight 84.8 kg, SpO2 98 %. GEN: NAD, lying in bed, conversant ENT: NCAT CV: Regular, normal S1 and S2 PULM: Clear bilaterally ABD: Soft, nontender SKIN: TDC intact and bandaged EXT: L UE AV fistula with bruit and thrill, no peripheral edema   Assessment 68M ESRD THS [facility] with 4th Ventricle ICH and HTN emergency  ESRD THS using TDC (cannulating LUE AVF) [facility] [place] ICH 4th Ventricle, per neurology HTN emergency, improved on cleviprex, largely managed by neurology currently Anemia: Hb stable, CTM for now BMD: PHosLo. Ca ok. Check P DM2 CAD was on clopidogrel AFIb was on apixaban   Plan No immediate HD needs, tentative for 10/3 WIll follow along  [name]  11/22/2021, 10:25 AM

## 2021-11-22 NOTE — Progress Notes (Addendum)
STROKE TEAM PROGRESS NOTE   INTERVAL HISTORY Patient is seen in his room with his wife at the bedside.  Over the past few days, he had been experiencing nausea, vomiting and malaise with a cough.  He presented to the ED and was found to be hypertensive with SBP in the 200s.  Head CT revealed ICH in the fourth ventricle.  It is uncertain when this occurred as patient's symptoms had been going on for several days.  Vitals:   11/22/21 1130 11/22/21 1200 11/22/21 1215 11/22/21 1230  BP: (!) 145/48 (!) 138/58 (!) 145/68 (!) 153/58  Pulse: 68 69  69  Resp: (!) 4 (!) 0 (!) 0 (!) 8  Temp:  (!) 100.5 F (38.1 C)    TempSrc:  Axillary    SpO2: 92% (!) 88%  92%  Weight:      Height:       CBC:  Recent Labs  Lab 11/21/21 1105 11/22/21 0054  WBC 6.6 7.2  HGB 9.8* 10.0*  HCT 32.6* 34.2*  MCV 74.3* 76.9*  PLT 140* 443*   Basic Metabolic Panel:  Recent Labs  Lab 11/21/21 1117 11/22/21 0054  NA 139 137  K 3.4* 4.0  CL 103 102  CO2 25 20*  GLUCOSE 88 76  BUN 28* 32*  CREATININE 7.69* 9.54*  CALCIUM 8.4* 8.7*  MG 1.9  --    Lipid Panel:  Recent Labs  Lab 11/21/21 1117  CHOL 94  TRIG 106  HDL 39*  CHOLHDL 2.4  VLDL 21  LDLCALC 34   HgbA1c:  Recent Labs  Lab 11/21/21 1105  HGBA1C 4.5*   Urine Drug Screen: No results for input(s): "LABOPIA", "COCAINSCRNUR", "LABBENZ", "AMPHETMU", "THCU", "LABBARB" in the last 168 hours.  Alcohol Level No results for input(s): "ETH" in the last 168 hours.  IMAGING past 24 hours MR BRAIN WO CONTRAST  Result Date: 11/21/2021 CLINICAL DATA:  Generalized weakness, stroke suspected EXAM: MRI HEAD WITHOUT CONTRAST TECHNIQUE: Multiplanar, multiecho pulse sequences of the brain and surrounding structures were obtained without intravenous contrast. COMPARISON:  06/23/2021 MRI, correlation is also made with CT head 11/21/2021 FINDINGS: Brain: No restricted diffusion to suggest acute or subacute infarct. T1 isointense and T2 hypointense material in  the fourth ventricle and foramen of Magendie, consistent with acute hemorrhage (1-3 days), which correlates with the hyperdense hemorrhage seen on the same-day head CT. Hemorrhage is also noted layering in the bilateral occipital horns (series 11, image 14). Additional areas of hemosiderin deposition without evidence of current hemorrhage in the bilateral temporal lobe sulci and sylvian fissures (series 14, image 31), along the anterior falx (series 14, image 39), along the bilateral operculum (series 14, image 34), and along the cerebellar folia (series 14, image 21). Additional punctate focus of hemosiderin deposition in the right periventricular frontal lobe white matter (series 14, image 20). All of this hemosiderin deposition is new compared to 06/23/2021. No acute parenchymal hemorrhage. No mass, mass effect, or midline shift. Mild enlargement of the lateral ventricles compared to 06/23/2021, concerning for mild hydrocephalus. Vascular: Normal arterial flow voids. Skull and upper cervical spine: Normal marrow signal. Sinuses/Orbits: Mucosal thickening in the maxillary sinuses and ethmoid air cells. Unchanged appearance of the orbits, with removal of the left globe. Status post right lens replacement. Other: The mastoids are well aerated. IMPRESSION: 1. Acute hemorrhage in the fourth ventricle and foramen of Magendie, which correlates with the hyperdense hemorrhage seen on the same-day head CT. 2. Hemorrhage layering within the bilateral occipital  horns, with mild enlargement of the lateral ventricles compared to 06/23/2021, concerning for mild hydrocephalus. Ventricular size is unchanged compared to the 11/21/2021 CT head. 3. Additional areas of hemosiderin deposition without evidence of current hemorrhage in the bilateral temporal lobe sulci and Sylvian fissures, along the anterior falx, along the bilateral operculum, and along the cerebellar folia, all of which are new compared to 06/23/2021. These findings  were discussed by telephone on 11/21/2021 at 10:09 pm with provider MCNEILL KIRKPATRICK . Electronically Signed   By: Merilyn Baba M.D.   On: 11/21/2021 22:11   CT Chest Wo Contrast  Result Date: 11/21/2021 CLINICAL DATA:  Respiratory illness, nondiagnostic xray EXAM: CT CHEST WITHOUT CONTRAST TECHNIQUE: Multidetector CT imaging of the chest was performed following the standard protocol without IV contrast. RADIATION DOSE REDUCTION: This exam was performed according to the departmental dose-optimization program which includes automated exposure control, adjustment of the mA and/or kV according to patient size and/or use of iterative reconstruction technique. COMPARISON:  Same day radiograph FINDINGS: Cardiovascular: Cardiomegaly. Moderate to large pericardial effusion. Three-vessel coronary artery atherosclerotic calcifications. RIGHT IJ CVC tip terminates in the RIGHT atrium. Scattered atherosclerotic calcifications of the nonaneurysmal thoracic aorta. Mediastinum/Nodes: Visualized thyroid is unremarkable. No axillary or mediastinal adenopathy. Lungs/Pleura: Trace RIGHT-sided pleural effusion. RIGHT basilar homogeneous opacity, likely atelectasis. No pneumothorax. LEFT apical ground-glass nodularity. RIGHT upper lobe 7 mm by 5 mm ground-glass pulmonary nodule versus ground-glass opacity (series 4, image 53). Upper Abdomen: Distension of the gallbladder. Musculoskeletal: No acute osseous abnormality. IMPRESSION: 1. Cardiomegaly with a moderate to large pericardial effusion. 2. LEFT apical ground-glass nodularity, likely infectious or inflammatory in etiology. There is a more focal RIGHT upper lobe ground-glass nodule measuring up to 6 mm. 6 mm right ground-glass pulmonary nodule within the upper lobe. Per Fleischner Society Guidelines, recommend a non-contrast Chest CT at 6-12 months to confirm persistence, then additional non-contrast Chest CTs every 2 years until 5 years. If nodule grows or develops solid  component(s), consider resection. These guidelines do not apply to immunocompromised patients and patients with cancer. Follow up in patients with significant comorbidities as clinically warranted. For lung cancer screening, adhere to Lung-RADS guidelines. Reference: Radiology. 2017; 284(1):228-43. 3. Trace RIGHT pleural effusion. 4. Distension of the gallbladder. Recommend correlation with physical exam. If concern for acute cholecystitis, recommend dedicated HIDA scan. Aortic Atherosclerosis (ICD10-I70.0). Electronically Signed   By: Valentino Saxon M.D.   On: 11/21/2021 19:49   CT ANGIO HEAD NECK W WO CM (CODE STROKE)  Result Date: 11/21/2021 CLINICAL DATA:  Provided history: Neuro deficit, acute, stroke suspected. EXAM: CT ANGIOGRAPHY HEAD AND NECK TECHNIQUE: Multidetector CT imaging of the head and neck was performed using the standard protocol during bolus administration of intravenous contrast. Multiplanar CT image reconstructions and MIPs were obtained to evaluate the vascular anatomy. Carotid stenosis measurements (when applicable) are obtained utilizing NASCET criteria, using the distal internal carotid diameter as the denominator. RADIATION DOSE REDUCTION: This exam was performed according to the departmental dose-optimization program which includes automated exposure control, adjustment of the mA and/or kV according to patient size and/or use of iterative reconstruction technique. CONTRAST:  156mL OMNIPAQUE IOHEXOL 350 MG/ML SOLN COMPARISON:  Performed earlier today 11/21/2021. CT angiogram head/neck 04/21/2021. FINDINGS: CTA NECK FINDINGS Aortic arch: Standard aortic branching. Atherosclerotic plaque within the visualized aortic arch and proximal major branch vessels of the neck. No hemodynamically significant innominate or proximal subclavian artery stenosis. Right carotid system: CCA and ICA patent within the neck without stenosis. Minimal atherosclerotic  about the carotid bifurcation. Left  carotid system: CCA and ICA patent within the neck without stenosis. Minimal atherosclerotic plaque of the carotid bifurcation Vertebral arteries: Vertebral arteries patent within the neck. The right vertebral artery is dominant. Nonstenotic calcified plaque within the proximal right vertebral artery. Skeleton: Cervical spondylosis, greatest at C3-C4. No acute fracture or aggressive osseous lesion. Other neck: Subcentimeter thyroid nodules, not meeting consensus criteria for ultrasound follow-up based on size. No follow-up imaging recommended. Reference: J Am Coll Radiol. 2015 Feb;12(2): 143-50. Generalized soft tissue edema within the neck. Upper chest: Clustered small nodules within the left lung apex. Review of the MIP images confirms the above findings CTA HEAD FINDINGS Anterior circulation: An ICA nonstenotic atherosclerotic plaque within both vessels. The M1 middle cerebral arteries are patent. No M2 proximal branch occlusion or high-grade proximal stenosis. The anterior cerebral arteries are patent. No intracranial aneurysm is identified. Posterior circulation: The intracranial vertebral arteries are patent. Atherosclerotic plaque within the intracranial right vertebral artery with no more than mild stenosis. The basilar artery is patent. The posterior cerebral arteries are patent. No evidence of an aneurysm or AVM within the posterior fossa. Venous sinuses: Within the limitations of contrast timing, no convincing thrombus. Anatomic variants: As described Review of the MIP images confirms the above findings IMPRESSION: CTA neck: 1. The common carotid and internal carotid arteries are patent within the neck without stenosis. Minimal atherosclerotic plaque about both carotid bifurcations. 2. Cerebral arteries patent within the neck. Nonstenotic plaque within the proximal right vertebral artery. 3.  Aortic Atherosclerosis (ICD10-I70.0). 4. Clustered small nodules within the left lung apex. Findings may reflect  infectious/inflammatory process. However, pulmonary neoplasm cannot be excluded. A dedicated chest CT is recommended for complete evaluation. CTA head: 1. No evidence of an aneurysm or AVM within the posterior fossa. 2. No intracranial large vessel occlusion or proximal high-grade arterial stenosis identified. Mild atherosclerotic disease, as described. Electronically Signed   By: Kellie Simmering D.O.   On: 11/21/2021 16:17   CT Head Wo Contrast  Result Date: 11/21/2021 CLINICAL DATA:  Transient ischemic attack. Additional history provided: Generalized weakness for the past week. EXAM: CT HEAD WITHOUT CONTRAST TECHNIQUE: Contiguous axial images were obtained from the base of the skull through the vertex without intravenous contrast. RADIATION DOSE REDUCTION: This exam was performed according to the departmental dose-optimization program which includes automated exposure control, adjustment of the mA and/or kV according to patient size and/or use of iterative reconstruction technique. COMPARISON:  Head CT 07/26/2021.  Brain MRI 06/23/2021. FINDINGS: Brain: No age advanced or lobar predominant parenchymal atrophy. Acute hemorrhage within the inferior aspect of the fourth ventricle, extending inferiorly through the foramen Magendie, and measuring 1.4 x 1.5 x 3.1 cm (for instance as seen on series 6, image 34). No evidence of obstructive hydrocephalus at this time. 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 Mild multifocal T2 FLAIR hyperintense signal abnormality within the cerebral white matter, nonspecific but compatible with chronic small vessel disease. No acute demarcated cortical infarct. No evidence of an intracranial mass. No midline shift. Vascular: No hyperdense vessel. Atherosclerotic calcifications. Skull: No fracture or aggressive osseous lesion. Sinuses/Orbits: Absent left globe. No significant paranasal sinus disease at the imaged levels.  These results were called by telephone at the time of interpretation on 11/21/2021 at 2:18 pm to provider Dr. Roderic Palau, who verbally acknowledged these results. IMPRESSION: 1.4 x 1.5 x 3.1 cm focus of acute hemorrhage within the inferior aspect of the  fourth ventricle, and extending inferiorly through the foramen of foramen of Magendie. No evidence of obstructive hydrocephalus at this time. This hemorrhage is indeterminate in etiology, and a CTA of the head may be obtained to help exclude an intracranial aneurysm or AVM. 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 Electronically Signed   By: Kellie Simmering D.O.   On: 11/21/2021 14:20    PHYSICAL EXAM General:  Drowsy, well-nourished, well-developed patient s/p removal of left eye in no acute distress Respiratory:  Regular, unlabored respirations  NEURO:  Mental Status: AA&Ox3  Speech/Language: speech is without dysarthria or aphasia.  Fluency, and comprehension intact.  Cranial Nerves:  II: PERRL in right eye, left eye absent III, IV, VI: EOMI.  VII: Smile is symmetrical.   VIII: hearing intact to voice. IX, X: Phonation is normal.  XII: tongue is midline without fasciculations. Motor: 5/5 strength to all muscle groups tested.  Tone: is normal and bulk is normal Sensation- Intact to light touch bilaterally.  Coordination: FTN intact bilaterally Gait- deferred   ASSESSMENT/PLAN [name]. is a 51 y.o. 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  He presented to the ED and was found to be hypertensive with SBP in the 200s.  Head CT revealed ICH in the fourth ventricle.  Eliquis was reversed with Andexxa. It is uncertain when this occurred as patient's symptoms had been going on for several days.  ICH score 2  IVH at fourth ventricle and lateral ventricles without  hydrocephalus, etiology likely related to uncontrolled hypertension CT head IVH within inferior aspect of fourth ventricle CTA head & neck No evidence of aneurysm, no LVO or high grade stenosis MRI  acute hemorrhage in fourth ventricle with layering in occipital horns and mild enlargement of lateral ventricles Follow up CT 10/1 stable IVH 2D Echo EF 65 to 70%, large pericardial effusion. The pericardial effusion is circumferential. There is no evidence of cardiac tamponade LDL 34 HgbA1c 4.5 VTE prophylaxis - SCDs clopidogrel 75 mg daily and Eliquis (apixaban) daily prior to admission, now on No antithrombotic secondary to [place] Therapy recommendations:  pending Disposition:  pending  Hypertensive emergency Home meds:  carvedilol 25 mg BID, amlodipine 5 mg daily, clonidine  0.1 mg TID PRN Unstable, requiring Cleviprex Now on amlodipine 10, Coreg 25 twice daily, clonidine 0.1 to 8 hours Keep SBP <160 Long-term BP goal normotensive  History of stroke and seizure 12/2020 admitted for syncope and unresponsiveness, CT negative 01/2021 admitted for transient slurred speech.  MRI showed left hippocampus punctate infarct.  MRA and carotid Doppler unremarkable, EEG no seizure.  LDL 38, A1c 5.7, EF 55 to 60%.  Discharged on Eliquis, Plavix and Lipitor 40 03/2021 admitted for left upper extremity weakness, CT negative.  CTA head and neck poor visualization but no significant finding.  MRI showed bilateral MCA/ACA watershed infarcts as well as left splenium small infarct.  EEG no seizure 06/2021 admitted for jerking movement for weeks.  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  MRI no acute infarct 07/2021 admitted for seizure-like activity in the setting of recent dialysis and glucose 77.  Long-term EEG monitoring no seizure.  Put on Keppra and discontinue Klonopin.  Hyperlipidemia Home meds: atorvastatin 80 mg daily LDL 34, goal < 70 Hold off statin for now due to acute  IVH Continue to resume statin  at discharge  ESRD on HD Patient receives dialysis T, Th, Sat Appreciate nephrology input Continue dialysis on outpatient schedule  Large pericardial effusion Found on both CT chest and 2D echo Likely due to ESRD No tamponade on 2D echo Hemodynamically stable Close monitoring  Other Stroke Risk Factors PVD dCHF  Other Active Problems Nodules in left lung Chest CT shows ground glass nodules in bilateral lungs Recommend follow up chest CT every 6-12 months to monitor nodules  Hospital day # [place] , MSN, AGACNP-BC [facility] See Amion for schedule and pager information 11/22/2021 2:03 PM   ATTENDING NOTE: I reviewed above note and agree with the assessment and plan. Pt was seen and examined.   51 year old male with history of hypertension, hyperlipidemia, diabetes, PVD, A-fib on Eliquis, ESRD on dialysis, history of stroke and seizure admitted for lethargy, nausea vomiting for days with uncontrolled hypertension.  CT showed fourth ventricle IVH.  CT head and neck no AVM or aneurysm.  CT chest showed large pericardial effusion.  MRI confirmed fourth ventricle IVH as well as bilateral occipital horn IVH.  2D echo showed EF 65 to 70% as well as large pericardial effusion but no cardiac tamponade.  LDL 34, A1c 4.5.  On exam, wife at bedside.  Patient lethargic, not cooperative with exam.  Eyes open, awake alert, orientated to age and place but stated months September.  No aphasia, paucity of speech, hypophonia, follow simple commands, able to name and repeat.  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  Bilateral upper extremity 3 -/5, bilateral lower extremity 3/5.  Sensation symmetrical subjectively and finger-to-nose grossly intact.  Etiology for patient IVH likely due to uncontrolled hypertension.  Currently on Cleviprex, BP goal less than 160.   Currently passed swallow, resume home medication with adjustment.  Hold off statin for now given acute ICH/IVH.  Continue HD per nephrology.  CT chest and 2D echo showed large pericardial effusion, so far hemodynamically stable, will continue monitoring and continue HD.  Will consult cardiology if needed.  For detailed assessment and plan, please refer to above/below as I have made changes wherever appropriate.   I had long discussion with wife and patient at bedside, updated pt current condition, treatment plan and potential prognosis, and answered all the questions.  They expressed understanding and appreciation.    Rosalin Hawking, MD PhD Stroke Neurology 11/22/2021 6:10 PM  This patient is critically ill due to IVH, hypertensive emergency, history of seizure and at significant risk of neurological worsening, death form recurrent IVH, hydrocephalus, hypertensive encephalopathy, status epilepticus. This patient's care requires constant monitoring of vital signs, hemodynamics, respiratory and cardiac monitoring, review of multiple databases, neurological assessment, discussion with family, other specialists and medical decision making of high complexity. I spent 35 minutes of neurocritical care time in the care of this patient.    To contact Stroke Continuity provider, please refer to [email or web address] After hours, contact General Neurology

## 2021-11-22 NOTE — Plan of Care (Signed)
  Problem: Education: Goal: Knowledge of disease or condition will improve Outcome: Progressing Goal: Knowledge of secondary prevention will improve (SELECT ALL) Outcome: Progressing Goal: Knowledge of patient specific risk factors will improve (INDIVIDUALIZE FOR PATIENT) Outcome: Progressing Goal: Individualized Educational Video(s) Outcome: Progressing   

## 2021-11-22 NOTE — Progress Notes (Signed)
OT Cancellation Note  Patient Details Name: [name]. MRN: [id number] DOB: 29-Oct-1970   Cancelled Treatment:    Reason Eval/Treat Not Completed: Active bedrest order (ICH with 24 hour BR, OT evaluaiton to follow up when appropriate.)  Elliot Cousin 11/22/2021, 7:36 AM

## 2021-11-22 NOTE — Progress Notes (Signed)
PT Cancellation Note  Patient Details Name: [name]. MRN: [id number] DOB: May 29, 1970   Cancelled Treatment:    Reason Eval/Treat Not Completed: Active bedrest order remains this morning until 10/1 at [place]. Will continue to follow and evaluate as appropriate.   [name], PT, DPT   Acute Rehabilitation Department   Sandra Cockayne 11/22/2021, 7:39 AM

## 2021-11-22 NOTE — Progress Notes (Addendum)
Pt w/ moments of sleep apnea. Dr. Leonel Ramsay informed. New order for CPAP placed.

## 2021-11-22 NOTE — Progress Notes (Signed)
*  PRELIMINARY RESULTS* Echocardiogram 2D Echocardiogram has been performed.  [name] 11/22/2021, 1:59 PM

## 2021-11-23 DIAGNOSIS — N186 End stage renal disease: Secondary | ICD-10-CM | POA: Diagnosis not present

## 2021-11-23 DIAGNOSIS — R509 Fever, unspecified: Secondary | ICD-10-CM

## 2021-11-23 DIAGNOSIS — I161 Hypertensive emergency: Secondary | ICD-10-CM | POA: Diagnosis not present

## 2021-11-23 DIAGNOSIS — I615 Nontraumatic intracerebral hemorrhage, intraventricular: Secondary | ICD-10-CM | POA: Diagnosis not present

## 2021-11-23 LAB — BLOOD CULTURE ID PANEL (REFLEXED) - BCID2

## 2021-11-23 LAB — HEPATITIS B CORE ANTIBODY, TOTAL: Hep B Core Total Ab: NONREACTIVE

## 2021-11-23 LAB — HEPATITIS B SURFACE ANTIBODY,QUALITATIVE: Hep B S Ab: REACTIVE — AB

## 2021-11-23 LAB — HEPATITIS B SURFACE ANTIGEN: Hepatitis B Surface Ag: NONREACTIVE

## 2021-11-23 LAB — HEPATITIS C ANTIBODY: HCV Ab: NONREACTIVE

## 2021-11-23 MED ORDER — CLONIDINE HCL 0.2 MG PO TABS
0.2000 mg | ORAL_TABLET | Freq: Three times a day (TID) | ORAL | Status: DC
  Administered 2021-11-23 – 2021-11-24 (×3): 0.2 mg via ORAL
  Filled 2021-11-23 (×3): qty 1

## 2021-11-23 MED ORDER — VANCOMYCIN HCL 1750 MG/350ML IV SOLN
1750.0000 mg | Freq: Once | INTRAVENOUS | Status: AC
  Administered 2021-11-23: 1750 mg via INTRAVENOUS
  Filled 2021-11-23: qty 350

## 2021-11-23 MED ORDER — VANCOMYCIN HCL 750 MG/150ML IV SOLN
750.0000 mg | INTRAVENOUS | Status: DC
  Filled 2021-11-23: qty 150

## 2021-11-23 NOTE — Evaluation (Signed)
Occupational Therapy Evaluation Patient Details Name: [name]. MRN: [id number] DOB: 12-30-1970 Today's Date: 11/23/2021   History of Present Illness 51 yo male admitted 9/30 ICH 4th ventricle and lateral ventricles  PMH ESRD TTS, HTN, amputation L big toe, L eye absent, CVA afib on plavix and Eliquis Glaucoma, seizure PVD CHF   Clinical Impression   PT admitted with ICH. Pt currently with functional limitiations due to the deficits listed below (see OT problem list). Pt previously at home with wife PTA. 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  Pt will benefit from skilled OT to increase their independence and safety with adls and balance to allow discharge CIR.       Recommendations for follow up therapy are one component of a multi-disciplinary discharge planning process, led by the attending physician.  Recommendations may be updated based on patient status, additional functional criteria and insurance authorization.   Follow Up Recommendations  Acute inpatient rehab (3hours/day)    Assistance Recommended at Discharge Intermittent Supervision/Assistance  Patient can return home with the following Two people to help with walking and/or transfers;Two people to help with bathing/dressing/bathroom    Functional Status Assessment  Patient has had a recent decline in their functional status and demonstrates the ability to make significant improvements in function in a reasonable and predictable amount of time.  Equipment Recommendations  BSC/3in1;Wheelchair (measurements OT);Wheelchair cushion (measurements OT)    Recommendations for Other Services Rehab consult     Precautions / Restrictions Precautions Precautions: Fall Precaution Comments: seizure, SBP 130-150; HD port R chest, pink bands bil UE due to fistula,      Mobility Bed Mobility Overal bed mobility: Needs  Assistance Bed Mobility: Rolling, Supine to Sit, Sit to Supine Rolling: +2 for physical assistance, Max assist   Supine to sit: +2 for physical assistance, Max assist Sit to supine: +2 for physical assistance, Max assist   General bed mobility comments: Pt rolling R and L to place pad. pt with (A) to elevate trunk from R Side lying. pt with poor static sitting posture R lateral lean. Pt with neck lateral flexion toward R shoulder. Pt requires max cues and physical (A) to return to supine    Transfers Overall transfer level: Needs assistance Equipment used: 2 person hand held assist Transfers: Sit to/from Stand Sit to Stand: +2 physical assistance, Max assist           General transfer comment: pt initiated sit<>Stand with elevated surface and counting to 3. pt with pad used to help initiate. pt once standing with hip flexion and bil LE in full extension against bed surface. pt needs (A) to elongate trunk. pt side stepping toward HOB with max (A) to advance bil LE.      Balance Overall balance assessment: Needs assistance Sitting-balance support: Single extremity supported, Feet supported Sitting balance-Leahy Scale: Poor     Standing balance support: Bilateral upper extremity supported, During functional activity Standing balance-Leahy Scale: Zero                             ADL either performed or assessed with clinical judgement   ADL Overall ADL's : Needs assistance/impaired Eating/Feeding: Maximal assistance;Sitting   Grooming: Maximal assistance;Sitting   Upper Body Bathing: Maximal assistance;Sitting           Lower Body Dressing: Total  assistance;Bed level                 General ADL Comments: pt incontinence of stool and lack of awareness. pt with skin break down in scrotal area and prior barrier cream present. pt very fatigued and flat affect     Vision Baseline Vision/History: 0 No visual deficits Additional Comments: no vision in L  eye, to be further assessed in R eye. Pt closing at times during session.     Perception     Praxis      Pertinent Vitals/Pain Pain Assessment Pain Assessment: Faces Faces Pain Scale: Hurts little more Pain Location: scrotal pain Pain Descriptors / Indicators: Grimacing, Guarding Pain Intervention(s): Monitored during session, Repositioned     Hand Dominance Left   Extremity/Trunk Assessment Upper Extremity Assessment Upper Extremity Assessment: Generalized weakness;LUE deficits/detail   Lower Extremity Assessment Lower Extremity Assessment: Defer to PT evaluation   Cervical / Trunk Assessment Cervical / Trunk Assessment: Kyphotic   Communication Communication Communication: No difficulties   Cognition Arousal/Alertness: Awake/alert Behavior During Therapy: Flat affect Overall Cognitive Status: Impaired/Different from baseline Area of Impairment: Orientation, Attention, Memory, Awareness                 Orientation Level: Disoriented to, Place, Time, Situation Current Attention Level: Focused Memory: Decreased recall of precautions, Decreased short-term memory     Awareness: Intellectual   General Comments: pt making comment "challenging me" when asked orientation questions. pt oriented to reason for admission and location. pt able to repeat "bleed in head" immediately afterward. pt asking time of the day and states wife gets off at [place]  break down on scrotum    Exercises     Shoulder Instructions      Home Living Family/patient expects to be discharged to:: Private residence Living Arrangements: Spouse/significant other Available Help at Discharge: Family;Available PRN/intermittently Type of Home: House Home Access: Ramped entrance     Home Layout: One level     Bathroom Shower/Tub: Hospital doctor Toilet: Handicapped height     Home Equipment: Conservation officer, nature (2 wheels);Cane - single point          Prior  Functioning/Environment Prior Level of Function : Needs assist             Mobility Comments: walks with Rollator ADLs Comments: wife helps (A) with adls        OT Problem List: Decreased strength;Decreased range of motion;Decreased activity tolerance;Impaired balance (sitting and/or standing);Impaired vision/perception;Decreased coordination;Decreased cognition;Decreased safety awareness;Decreased knowledge of use of DME or AE;Decreased knowledge of precautions;Cardiopulmonary status limiting activity      OT Treatment/Interventions: Self-care/ADL training;Therapeutic exercise;Neuromuscular education;DME and/or AE instruction;Manual therapy;Modalities;Therapeutic activities;Cognitive remediation/compensation;Visual/perceptual remediation/compensation;Patient/family education;Balance training    OT Goals(Current goals can be found in the care plan section) Acute Rehab OT Goals Patient Stated Goal: none stated by patient. Pt does express he likes to lay on his back OT Goal Formulation: Patient unable to participate in goal setting Time For Goal Achievement: 12/07/21 Potential to Achieve Goals: Good  OT Frequency: Min 2X/week    Co-evaluation PT/OT/SLP Co-Evaluation/Treatment: Yes Reason for Co-Treatment: Complexity of the patient's impairments (multi-system involvement);Necessary to address cognition/behavior during functional activity;For patient/therapist safety;To address functional/ADL transfers   OT goals addressed during session: ADL's and self-care;Strengthening/ROM      AM-PAC OT "6 Clicks" Daily Activity     Outcome Measure Help from another person eating meals?: A Lot Help from another person taking  care of personal grooming?: A Lot Help from another person toileting, which includes using toliet, bedpan, or urinal?: A Lot Help from another person bathing (including washing, rinsing, drying)?: A Lot Help from another person to put on and taking off regular upper body  clothing?: A Lot Help from another person to put on and taking off regular lower body clothing?: Total 6 Click Score: 11   End of Session Equipment Utilized During Treatment: Gait belt Nurse Communication: Mobility status;Precautions  Activity Tolerance: Patient limited by fatigue Patient left: in bed;with call bell/phone within reach;with bed alarm set  OT Visit Diagnosis: Unsteadiness on feet (R26.81);Muscle weakness (generalized) (M62.81)                Time: 1355-1420 OT Time Calculation (min): 25 min Charges:  OT General Charges $OT Visit: 1 Visit OT Evaluation $OT Eval Moderate Complexity: 1 Mod      Brynn Janicia Monterrosa 11/23/2021, 2:43 PM

## 2021-11-23 NOTE — Progress Notes (Signed)
SLP Cancellation Note  Patient Details Name: [name]. MRN: [id number] DOB: [birth year]   Cancelled treatment:        Pt refused to participate, said he wanted to rest. Will attempt at a later date   Lynann Beaver 11/23/2021, 11:05 AM

## 2021-11-23 NOTE — Evaluation (Signed)
Physical Therapy Evaluation Patient Details Name: [name]. MRN: [id number] DOB: 1970/04/21 Today's Date: 11/23/2021  History of Present Illness  51 yo male admitted 9/30 ICH 4th ventricle and lateral ventricles  PMH ESRD TTS, HTN, amputation L big toe, L eye absent, CVA afib on plavix and Eliquis Glaucoma, seizure PVD CHF  Clinical Impression  Patient presents with decreased mobility due to deficits listed in PT problem list.  Currently +2 A for all mobility with mod A for sitting balance with R lateral lean and poor midline awareness.  Decreased activity tolerance with Bp 144/112 sitting EOB and noted feverish (RN reports started on antibiotics).  Feel he will need continued skilled PT in the acute setting and follow up acute inpatient rehab prior to d/c home with wife assist.        Recommendations for follow up therapy are one component of a multi-disciplinary discharge planning process, led by the attending physician.  Recommendations may be updated based on patient status, additional functional criteria and insurance authorization.  Follow Up Recommendations Acute inpatient rehab (3hours/day)      Assistance Recommended at Discharge Frequent or constant Supervision/Assistance  Patient can return home with the following  Two people to help with walking and/or transfers;Two people to help with bathing/dressing/bathroom;Direct supervision/assist for medications management;Assist for transportation;Assistance with cooking/housework;Direct supervision/assist for financial management    Equipment Recommendations Other (comment) (TBA)  Recommendations for Other Services       Functional Status Assessment Patient has had a recent decline in their functional status and demonstrates the ability to make significant improvements in function in a reasonable and predictable amount of time.     Precautions / Restrictions Precautions Precautions: Fall Precaution Comments: seizure, SBP  130-150; HD port R chest, pink bands bil UE due to fistula,      Mobility  Bed Mobility Overal bed mobility: Needs Assistance Bed Mobility: Rolling, Supine to Sit Rolling: +2 for physical assistance, Max assist   Supine to sit: +2 for physical assistance, Max assist Sit to supine: +2 for physical assistance, Max assist   General bed mobility comments: Pt rolling R and L to place pad. pt with (A) to elevate trunk from R Side lying. pt with poor static sitting posture R lateral lean. Pt with neck lateral flexion toward R shoulder. Pt requires max cues and physical (A) to return to supine    Transfers Overall transfer level: Needs assistance Equipment used: 2 person hand held assist Transfers: Sit to/from Stand Sit to Stand: +2 physical assistance, Max assist           General transfer comment: pt initiated sit<>Stand with elevated surface and counting to 3. pt with pad used to help initiate. pt once standing with hip flexion and bil LE in full extension against bed surface. pt needs (A) to elongate trunk. pt side stepping toward HOB with max (A) to advance bil LE.    Ambulation/Gait                  Stairs            Wheelchair Mobility    Modified Rankin (Stroke Patients Only) Modified Rankin (Stroke Patients Only) Pre-Morbid Rankin Score: Moderate disability Modified Rankin: Severe disability     Balance Overall balance assessment: Needs assistance Sitting-balance support: Feet supported Sitting balance-Leahy Scale: Poor Sitting balance - Comments: mod A for balance with head laterally flexed to R and poor midline orientation   Standing balance support: Bilateral upper extremity supported,  During functional activity Standing balance-Leahy Scale: Zero                               Pertinent Vitals/Pain Pain Assessment Faces Pain Scale: Hurts little more Pain Location: scrotal pain Pain Descriptors / Indicators: Grimacing, Guarding Pain  Intervention(s): Monitored during session, Repositioned    Home Living Family/patient expects to be discharged to:: Private residence Living Arrangements: Spouse/significant other Available Help at Discharge: Family;Available PRN/intermittently Type of Home: House Home Access: Ramped entrance       Home Layout: One level Home Equipment: Conservation officer, nature (2 wheels);Cane - single point      Prior Function Prior Level of Function : Needs assist             Mobility Comments: walks with Rollator ADLs Comments: wife helps (A) with adls     Hand Dominance   Dominant Hand: Left    Extremity/Trunk Assessment   Upper Extremity Assessment Upper Extremity Assessment: Defer to OT evaluation    Lower Extremity Assessment Lower Extremity Assessment: Generalized weakness    Cervical / Trunk Assessment Cervical / Trunk Assessment: Kyphotic  Communication   Communication: No difficulties  Cognition Arousal/Alertness: Awake/alert Behavior During Therapy: Flat affect Overall Cognitive Status: Impaired/Different from baseline Area of Impairment: Orientation, Attention, Memory, Awareness                 Orientation Level: Disoriented to, Place, Time, Situation Current Attention Level: Focused Memory: Decreased recall of precautions, Decreased short-term memory     Awareness: Intellectual   General Comments: pt making comment "challenging me" when asked orientation questions. pt oriented to reason for admission and location. pt able to repeat "bleed in head" immediately afterward. pt asking time of the day and states wife gets off at [place] comments (skin integrity, edema, etc.): Skin breakdown on scrotum RN aware; incontinent of bowel    Exercises     Assessment/Plan    PT Assessment Patient needs continued PT services  PT Problem List Decreased strength;Decreased mobility;Impaired sensation;Decreased balance;Decreased activity  tolerance;Decreased cognition;Decreased knowledge of precautions;Decreased safety awareness       PT Treatment Interventions DME instruction;Therapeutic exercise;Balance training;Gait training;Neuromuscular re-education;Functional mobility training;Therapeutic activities;Patient/family education;Cognitive remediation    PT Goals (Current goals can be found in the Care Plan section)  Acute Rehab PT Goals Patient Stated Goal: unable to state PT Goal Formulation: Patient unable to participate in goal setting Time For Goal Achievement: 12/07/21 Potential to Achieve Goals: Good    Frequency Min 4X/week     Co-evaluation PT/OT/SLP Co-Evaluation/Treatment: Yes Reason for Co-Treatment: Complexity of the patient's impairments (multi-system involvement);For patient/therapist safety;To address functional/ADL transfers;Necessary to address cognition/behavior during functional activity PT goals addressed during session: Mobility/safety with mobility;Balance OT goals addressed during session: ADL's and self-care;Strengthening/ROM       AM-PAC PT "6 Clicks" Mobility  Outcome Measure Help needed turning from your back to your side while in a flat bed without using bedrails?: Total Help needed moving from lying on your back to sitting on the side of a flat bed without using bedrails?: Total Help needed moving to and from a bed to a chair (including a wheelchair)?: Total Help needed standing up from a chair using your arms (e.g., wheelchair or bedside chair)?: Total Help needed to walk in hospital room?: Total Help needed climbing 3-5 steps with a railing? : Total 6 Click Score: 6  End of Session Equipment Utilized During Treatment: Gait belt Activity Tolerance: Patient limited by fatigue;Patient limited by lethargy Patient left: in bed;with call bell/phone within reach   PT Visit Diagnosis: Other abnormalities of gait and mobility (R26.89);Other symptoms and signs involving the nervous  system (R29.898);Muscle weakness (generalized) (M62.81)    Time: 4128-7867 PT Time Calculation (min) (ACUTE ONLY): 33 min   Charges:   PT Evaluation $PT Eval Moderate Complexity: 1 Mod          Cyndi Yeilin Zweber, PT Acute Rehabilitation Services Office:[phone] 11/23/2021   [name] 11/23/2021, 4:39 PM

## 2021-11-23 NOTE — Progress Notes (Signed)
Patient ID: [name]., male   DOB: 07/11/1970, 51 y.o.   MRN: [id number] [facility] Progress Note   Assessment/ Plan:   1.  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  Not on antithrombotic therapy with ICH. 2. ESRD: Will order for hemodialysis tomorrow to continue his outpatient TTS schedule via right IJ TDC.  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 3. Anemia: Globin and hematocrit currently within acceptable range, continue to follow to decide need to begin ESA.   4. CKD-MBD: Calcium level acceptable, will monitor phosphorus trends. 5. Nutrition: Continue current renal diet/fluid restriction with monitoring albumin levels to decide on need for nutritional supplementation. 6. Hypertension: Blood pressure appears to be currently under good control with ongoing amlodipine, carvedilol and clevidipine.  Subjective:   Febrile overnight, noted to have component of poor participation in care.   Objective:   BP (!) 142/53 (BP Location: Right Arm)   Pulse 68   Temp (!) 101 F (38.3 C) (Axillary)   Resp 10   Ht 6\' 3"  (1.905 m)   Wt 84.8 kg   SpO2 95%   BMI 23.37 kg/m   Physical Exam: Gen: Appears comfortable resting in bed, arouses and engages minimally in conversation. CVS: Pulse regular rhythm, normal rate, S1 and S2 normal Resp: Clear to auscultation bilaterally, no distinct rales or rhonchi.  Right IJ TDC Abd: Soft, flat, nontender, bowel sounds normal Ext: No lower extremity edema.  Status post left foot hallux amputation.  Left brachiocephalic fistula with poor augmentation and thrill in outflow.  Labs: BMET Recent Labs  Lab 11/21/21 1117 11/22/21 0054  NA 139 137  K 3.4* 4.0  CL 103 102  CO2 25 20*   GLUCOSE 88 76  BUN 28* 32*  CREATININE 7.69* 9.54*  CALCIUM 8.4* 8.7*   CBC Recent Labs  Lab 11/21/21 1105 11/22/21 0054  WBC 6.6 7.2  HGB 9.8* 10.0*  HCT 32.6* 34.2*  MCV 74.3* 76.9*  PLT 140* 120*     Medications:     amiodarone  200 mg Oral Daily   amLODipine  10 mg Oral Daily   calcium acetate  1,334 mg Oral TID WC   carvedilol  25 mg Oral BID   Chlorhexidine Gluconate Cloth  6 each Topical Daily   cloNIDine  0.1 mg Oral Q8H   dorzolamide-timolol  1 drop Right Eye BID   [START ON 11/24/2021] levETIRAcetam  250 mg Oral Q T,Th,Sa-HD   levETIRAcetam  500 mg Oral BID   pantoprazole  40 mg Oral Daily   prednisoLONE acetate  1 drop Left Eye Q4H while awake   senna-docusate  1 tablet Oral BID   tamsulosin  0.4 mg Oral Daily   Elmarie Shiley, MD 11/23/2021, 10:08 AM

## 2021-11-23 NOTE — Progress Notes (Signed)
? ?  Inpatient Rehab Admissions Coordinator : ? ?Per therapy recommendations, patient was screened for CIR candidacy by Jaecob Lowden RN MSN.  At this time patient appears to be a potential candidate for CIR. I will place a rehab consult per protocol for full assessment. Please call me with any questions. ? ?Pratyush Ammon RN MSN ?Admissions Coordinator ?[phone] ?  ?

## 2021-11-23 NOTE — Progress Notes (Signed)
Pt receives out-pt HD at [facility] on TTS. Pt arrives at 6:00 am for 6:15 am chair time. Will assist as needed.   Melven Sartorius Renal Navigator [phone]

## 2021-11-23 NOTE — Progress Notes (Addendum)
STROKE TEAM PROGRESS NOTE   INTERVAL HISTORY No family at the bedside. Patient sleeping in the bed. He is on cleviprex 5mg  IV. Attempt to wean gtt as able, Norvasc increased to 10mg  today. Consider increasing clonidine.  Patient not cooperative with examiner. He is alert and oriented, follows commands Missing left big toe. Sensation symmetric. Can name objects and repeat.   Vitals:   11/23/21 0500 11/23/21 0600 11/23/21 0700 11/23/21 0800  BP: (!) 144/77 (!) 169/80 (!) 155/89   Pulse: 68 70 69   Resp: 13 12 10    Temp:    (!) 101 F (38.3 C)  TempSrc:    Axillary  SpO2: 95% 96% 100%   Weight:      Height:       CBC:  Recent Labs  Lab 11/21/21 1105 11/22/21 0054  WBC 6.6 7.2  HGB 9.8* 10.0*  HCT 32.6* 34.2*  MCV 74.3* 76.9*  PLT 140* 120*    Basic Metabolic Panel:  Recent Labs  Lab 11/21/21 1117 11/22/21 0054  NA 139 137  K 3.4* 4.0  CL 103 102  CO2 25 20*  GLUCOSE 88 76  BUN 28* 32*  CREATININE 7.69* 9.54*  CALCIUM 8.4* 8.7*  MG 1.9  --     Lipid Panel:  Recent Labs  Lab 11/21/21 1117  CHOL 94  TRIG 106  HDL 39*  CHOLHDL 2.4  VLDL 21  LDLCALC 34    HgbA1c:  Recent Labs  Lab 11/21/21 1105  HGBA1C 4.5*    Urine Drug Screen: No results for input(s): "LABOPIA", "COCAINSCRNUR", "LABBENZ", "AMPHETMU", "THCU", "LABBARB" in the last 168 hours.  Alcohol Level No results for input(s): "ETH" in the last 168 hours.  IMAGING past 24 hours CT HEAD WO CONTRAST (5MM)  Result Date: 11/22/2021 CLINICAL DATA:  Stroke, hemorrhagic EXAM: CT HEAD WITHOUT CONTRAST TECHNIQUE: Contiguous axial images were obtained from the base of the skull through the vertex without intravenous contrast. RADIATION DOSE REDUCTION: This exam was performed according to the departmental dose-optimization program which includes automated exposure control, adjustment of the mA and/or kV according to patient size and/or use of iterative reconstruction technique. COMPARISON:  11/21/2021 CT  head, correlation is also made with 11/21/2021 MRI head and CT head 07/26/2021 FINDINGS: Brain: Redemonstrated hyperdense hemorrhage in the inferior fourth ventricle, extending through the foramen of [place], and hypodense hemorrhage layering in the bilateral occipital horns, unchanged from the prior exam. No additional acute hemorrhage is seen. No evidence of acute infarction, mass, mass effect, or midline shift. No additional extra-axial fluid collection. Unchanged mild ventricular enlargement compared to more remote head CTs, but unchanged compared to 11/21/2021. Vascular: No hyperdense vessel. Atherosclerotic calcifications in the intracranial carotid and vertebral arteries. Skull: Normal. Negative for fracture or focal lesion. Sinuses/Orbits: Mild mucosal thickening in the maxillary sinuses, sphenoid sinuses, and ethmoid air cells. Redemonstrated left enucleation. Unchanged appearance of the right globe, status post right lens replacement. Other: The mastoid air cells are well aerated. IMPRESSION: 1. Unchanged hemorrhage in the inferior fourth ventricle, extending through the foramen of [place], and hypodense hemorrhage layering in the bilateral occipital horns. Unchanged size and configuration of the ventricular system compared to 11/21/2021. 2. No additional acute intracranial process. Electronically Signed   By: Merilyn Baba M.D.   On: 11/22/2021 19:58   ECHOCARDIOGRAM COMPLETE  Result Date: 11/22/2021    ECHOCARDIOGRAM REPORT   Patient Name:   Deke Tilghman. Date of Exam: 11/22/2021 Medical Rec #:  [id number]  Height:       75.0 in Accession #:    [id number]           Weight:       187.0 lb Date of Birth:  [birth year]             BSA:          2.133 m Patient Age:    51 years             BP:           124/54 mmHg Patient Gender: M                    HR:           68 bpm. Exam Location:  Inpatient Procedure: 2D Echo, Cardiac Doppler and Color Doppler Indications:    I51.7 Cardiomegaly   History:        Patient has prior history of Echocardiogram examinations, most                 recent 04/22/2021. Cardiomegaly; Risk Factors:Hypertension,                 Diabetes and Dyslipidemia. H/O prior STEMI 02/2020 s/p DES to                 LAD, ESRD on hemodialysis [facility]), CVA (cerebral vascular                 accident) ([place]).  Sonographer:    Alvino Chapel RCS Referring Phys: [phone] MCNEILL P [place]  1. Left ventricular ejection fraction, by estimation, is 65 to 70%. The left ventricle has normal function. The left ventricle has no regional wall motion abnormalities. There is severe concentric left ventricular hypertrophy. Left ventricular diastolic  parameters are consistent with Grade II diastolic dysfunction (pseudonormalization).  2. Right ventricular systolic function is normal. The right ventricular size is normal. Tricuspid regurgitation signal is inadequate for assessing PA pressure.  3. Left atrial size was severely dilated.  4. Large pericardial effusion. The pericardial effusion is circumferential. There is no evidence of cardiac tamponade.  5. The mitral valve is normal in structure. Trivial mitral valve regurgitation.  6. The aortic valve is tricuspid. Aortic valve regurgitation is not visualized. Aortic valve sclerosis is present, with no evidence of aortic valve stenosis.  7. Aortic dilatation noted. There is mild dilatation of the aortic root, measuring 40 mm.  8. The inferior vena cava is dilated in size with >50% respiratory variability, suggesting right atrial pressure of 8 mmHg. Comparison(s): When compared to prior TTE on 04/2021, the pericardial effusion has now increased in size and is more prominent around the RA. There is no evidence of tamponade. FINDINGS  Left Ventricle: Left ventricular ejection fraction, by estimation, is 65 to 70%. The left ventricle has normal function. The left ventricle has no regional wall motion abnormalities. The left ventricular internal  cavity size was normal in size. There is  severe concentric left ventricular hypertrophy. Left ventricular diastolic parameters are consistent with Grade II diastolic dysfunction (pseudonormalization). Right Ventricle: The right ventricular size is normal. No increase in right ventricular wall thickness. Right ventricular systolic function is normal. Tricuspid regurgitation signal is inadequate for assessing PA pressure. Left Atrium: Left atrial size was severely dilated. Right Atrium: Right atrial size was normal in size. Pericardium: A large pericardial effusion is present. The pericardial effusion is circumferential. There is no evidence of cardiac tamponade. Mitral Valve: The mitral valve  is normal in structure. Trivial mitral valve regurgitation. Tricuspid Valve: The tricuspid valve is normal in structure. Tricuspid valve regurgitation is trivial. Aortic Valve: The aortic valve is tricuspid. Aortic valve regurgitation is not visualized. Aortic valve sclerosis is present, with no evidence of aortic valve stenosis. Aortic valve mean gradient measures 7.0 mmHg. Aortic valve peak gradient measures 10.9 mmHg. Aortic valve area, by VTI measures 4.28 cm. Pulmonic Valve: The pulmonic valve was normal in structure. Pulmonic valve regurgitation is trivial. Aorta: Aortic dilatation noted. There is mild dilatation of the aortic root, measuring 40 mm. Venous: The inferior vena cava is dilated in size with greater than 50% respiratory variability, suggesting right atrial pressure of 8 mmHg. IAS/Shunts: The atrial septum is grossly normal.  LEFT VENTRICLE PLAX 2D LVIDd:         5.30 cm   Diastology LVIDs:         2.80 cm   LV e' medial:    5.22 cm/s LV PW:         1.80 cm   LV E/e' medial:  15.8 LV IVS:        1.90 cm   LV e' lateral:   7.18 cm/s LVOT diam:     2.60 cm   LV E/e' lateral: 11.5 LV SV:         159 LV SV Index:   75 LVOT Area:     5.31 cm  RIGHT VENTRICLE RV S prime:     14.00 cm/s TAPSE (M-mode): 2.2 cm LEFT  ATRIUM              Index        RIGHT ATRIUM           Index LA diam:        3.90 cm  1.83 cm/m   RA Area:     21.40 cm LA Vol (A2C):   132.0 ml 61.90 ml/m  RA Volume:   68.40 ml  32.07 ml/m LA Vol (A4C):   152.0 ml 71.27 ml/m LA Biplane Vol: 143.0 ml 67.05 ml/m  AORTIC VALVE AV Area (Vmax):    4.70 cm AV Area (Vmean):   4.23 cm AV Area (VTI):     4.28 cm AV Vmax:           165.00 cm/s AV Vmean:          122.000 cm/s AV VTI:            0.372 m AV Peak Grad:      10.9 mmHg AV Mean Grad:      7.0 mmHg LVOT Vmax:         146.00 cm/s LVOT Vmean:        97.300 cm/s LVOT VTI:          0.300 m LVOT/AV VTI ratio: 0.81  AORTA Ao Root diam: 4.00 cm MITRAL VALVE MV Area (PHT): 2.91 cm    SHUNTS MV Decel Time: 261 msec    Systemic VTI:  0.30 m MV E velocity: 82.30 cm/s  Systemic Diam: 2.60 cm MV A velocity: 80.80 cm/s MV E/A ratio:  1.02 Gwyndolyn Kaufman MD Electronically signed by Gwyndolyn Kaufman MD Signature Date/Time: 11/22/2021/3:16:41 PM    Final     PHYSICAL EXAM General:  Drowsy, well-nourished, well-developed patient s/p removal of left eye in no acute distress Respiratory:  Regular, unlabored respirations  NEURO:  Mental Status: AA&Ox3  Speech/Language: speech is without dysarthria or aphasia.  Fluency, and comprehension intact.  Cranial Nerves:  II: PERRL in right eye, left eye absent III, IV, VI: EOMI.  VII: Smile is symmetrical.   VIII: hearing intact to voice. IX, X: Phonation is normal.  XII: tongue is midline without fasciculations. Motor: 5/5 strength to all muscle groups tested.  Tone: is normal and bulk is normal Sensation- Intact to light touch bilaterally.  Coordination: FTN intact bilaterally Gait- deferred   ASSESSMENT/PLAN Mr. Vance Jese Comella. is a 51 y.o. 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  He presented to the ED and was found to  be hypertensive with SBP in the 200s.  Head CT revealed ICH in the fourth ventricle.  Eliquis was reversed with Andexxa. It is uncertain when this occurred as patient's symptoms had been going on for several days.  ICH score 2  IVH at fourth ventricle and lateral ventricles without hydrocephalus, etiology likely related to uncontrolled hypertension CT head IVH within inferior aspect of fourth ventricle CTA head & neck No evidence of aneurysm, no LVO or high grade stenosis MRI  acute hemorrhage in fourth ventricle with layering in occipital horns and mild enlargement of lateral ventricles Follow up CT 10/1 stable IVH 2D Echo EF 65 to 70%, large pericardial effusion. The pericardial effusion is circumferential. There is no evidence of cardiac tamponade LDL 34 HgbA1c 4.5 VTE prophylaxis - SCDs clopidogrel 75 mg daily and Eliquis (apixaban) daily prior to admission, now on No antithrombotic secondary to IVH Therapy recommendations:  pending Disposition:  pending  Hypertensive emergency Home meds:  carvedilol 25 mg BID, amlodipine 5 mg daily, clonidine  0.1 mg TID PRN Unstable, requiring Cleviprex Now on amlodipine 10, Coreg 25 twice daily, clonidine 0.1->0.2 to 8 hours Keep SBP <160 Long-term BP goal normotensive  History of stroke and seizure 12/2020 admitted for syncope and unresponsiveness, CT negative 01/2021 admitted for transient slurred speech.  MRI showed left hippocampus punctate infarct.  MRA and carotid Doppler unremarkable, EEG no seizure.  LDL 38, A1c 5.7, EF 55 to 60%.  Discharged on Eliquis, Plavix and Lipitor 40 03/2021 admitted for left upper extremity weakness, CT negative.  CTA head and neck poor visualization but no significant finding.  MRI showed bilateral MCA/ACA watershed infarcts as well as left splenium small infarct.  EEG no seizure 06/2021 admitted for jerking movement for weeks.  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  MRI no acute  infarct 07/2021 admitted for seizure-like activity in the setting of recent dialysis and glucose 77.  Long-term EEG monitoring no seizure.  Put on Keppra and discontinue Klonopin.  Fevers Persistent fevers 1 of 3 blood cultures growing MRSE, typically a contaminant, however given poor hygiene and continue fevers will treat with Vanc.  Repeat blood culture  Curbside ID  Hyperlipidemia Home meds: atorvastatin 80 mg daily LDL 34, goal < 70 Hold off statin for now due to acute IVH Consider to resume statin at discharge  ESRD on HD Patient receives dialysis T, Th, Sat Appreciate nephrology input Continue dialysis on outpatient schedule  Large pericardial effusion Found on both CT chest and 2D echo Likely due to ESRD No tamponade on 2D echo Hemodynamically stable Close monitoring  Other Stroke Risk Factors PVD dCHF  Other Active Problems Nodules in left lung Chest CT shows ground glass nodules in bilateral lungs Recommend follow up chest CT every 6-12 months to monitor nodules  Hospital day # 2  August Albino DNP, ACNPC-AG [facility] See Amion for schedule and pager information 11/23/2021 9:19 AM    ATTENDING NOTE: I reviewed above note and agree with the assessment and plan. Pt was seen and examined.   No family at bedside.  Patient still lethargic, open eyes on voice, not quite cooperative with exam however no significant focal deficit seen although with whole body weakness.  Patient had fever overnight, Tmax 101.3 and 1/3 blood culture positive for staph epi, discussed with pharmacy, will start treating with vancomycin.  Recommend to have ID curbside consult.  BP still unstable, on Cleviprex, will increase clonidine from 0.1 to 0.2 every 8 hours, try to taper off Cleviprex.  PT/OT recommend CIR.  For detailed assessment and plan, please refer to above/below as I have made changes wherever appropriate.   Rosalin Hawking, MD PhD Stroke Neurology 11/23/2021 6:11  PM  This patient is critically ill due to IVH, hypertensive emergency, history of seizure and at significant risk of neurological worsening, death form recurrent IVH, hydrocephalus, hypertensive encephalopathy, status epilepticus. This patient's care requires constant monitoring of vital signs, hemodynamics, respiratory and cardiac monitoring, review of multiple databases, neurological assessment, discussion with family, other specialists and medical decision making of high complexity. I spent 35 minutes of neurocritical care time in the care of this patient.

## 2021-11-23 NOTE — Progress Notes (Signed)
Pharmacy Antibiotic Note  [name] Gloyd Happ. is a 51 y.o. male admitted on 11/21/2021 with ICH.  Pharmacy has been consulted for Vancomycin dosing given blood cultures with MRSE.  Admit blood cultures growing MRSE in 1 of 3 bottles. Discussed with neurology - while often a contaminant, given poor hygiene and persistent fevers, would like to treat empirically. Plan for repeat blood cultures today, then antibiotics after to assess if contaminant or pathogenic.  ESRD at baseline: plan to continue with HD TTS for now.  Plan: Vancomycin 1750mg  IV x 1 dose Then vancomycin 750mg  IV after HD on TTS Monitor blood cultures for de-escalation Monitor HD schedule and adjust regimen as needed  Height: 6\' 3"  (190.5 cm) Weight: 84.8 kg (187 lb) IBW/kg (Calculated) : 84.5  Temp (24hrs), Avg:100.7 F (38.2 C), Min:100 F (37.8 C), Max:101.5 F (38.6 C)  Recent Labs  Lab 11/21/21 1105 11/21/21 1117 11/22/21 0054  WBC 6.6  --  7.2  CREATININE  --  7.69* 9.54*    Estimated Creatinine Clearance: 10.7 mL/min (A) (by C-G formula based on SCr of 9.54 mg/dL (H)).    Allergies  Allergen Reactions   Promethazine Diarrhea and Other (See Comments)    Muscle cramps   Promethazine Hcl Other (See Comments)    Cramping all over  Other reaction(s): cramp all over   Other     Other reaction(s): Unknown Other reaction(s): Unknown   Malt Other (See Comments)    Cramping    Antimicrobials this admission: Vanc 10/2 >>  Microbiology results: 10/1 Bcx: 1 of 3 MRSE 10/2 Bcx: sent  Thank you for allowing pharmacy to be a part of this patient's care.  Merrilee Jansky, PharmD Clinical Pharmacist 11/23/2021 11:34 AM

## 2021-11-23 NOTE — Progress Notes (Signed)
PHARMACY - PHYSICIAN COMMUNICATION CRITICAL VALUE ALERT - BLOOD CULTURE IDENTIFICATION (BCID)  [name]. is an 52 y.o. male who presented to [facility] on 11/21/2021 with a chief complaint of weakness.  Assessment:  Admitted for ICH, now w/ MRSE growing in 1 of 3 blood cx bottles, typically a contaminant; noted that pt spiked a fever overnight, tx'd w/ APAP.  Name of physician (or Provider) Contacted: [email or web address] via secure chat  Current antibiotics: None  Changes to prescribed antibiotics recommended:  No ABX needed; if clinical condition warrants antimicrobial would recommend vancomycin.  Results for orders placed or performed during the hospital encounter of 11/21/21  Blood Culture ID Panel (Reflexed) (Collected: 11/22/2021 12:54 AM)  Result Value Ref Range   Enterococcus faecalis NOT DETECTED NOT DETECTED   Enterococcus Faecium NOT DETECTED NOT DETECTED   Listeria monocytogenes NOT DETECTED NOT DETECTED   Staphylococcus species DETECTED (A) NOT DETECTED   Staphylococcus aureus (BCID) NOT DETECTED NOT DETECTED   Staphylococcus epidermidis DETECTED (A) NOT DETECTED   Staphylococcus lugdunensis NOT DETECTED NOT DETECTED   Streptococcus species NOT DETECTED NOT DETECTED   Streptococcus agalactiae NOT DETECTED NOT DETECTED   Streptococcus pneumoniae NOT DETECTED NOT DETECTED   Streptococcus pyogenes NOT DETECTED NOT DETECTED   A.calcoaceticus-baumannii NOT DETECTED NOT DETECTED   Bacteroides fragilis NOT DETECTED NOT DETECTED   Enterobacterales NOT DETECTED NOT DETECTED   Enterobacter cloacae complex NOT DETECTED NOT DETECTED   Escherichia coli NOT DETECTED NOT DETECTED   Klebsiella aerogenes NOT DETECTED NOT DETECTED   Klebsiella oxytoca NOT DETECTED NOT DETECTED   Klebsiella pneumoniae NOT DETECTED NOT DETECTED   Proteus species NOT DETECTED NOT DETECTED   Salmonella species NOT DETECTED NOT DETECTED   Serratia marcescens NOT DETECTED NOT DETECTED   Haemophilus  influenzae NOT DETECTED NOT DETECTED   Neisseria meningitidis NOT DETECTED NOT DETECTED   Pseudomonas aeruginosa NOT DETECTED NOT DETECTED   Stenotrophomonas maltophilia NOT DETECTED NOT DETECTED   Candida albicans NOT DETECTED NOT DETECTED   Candida auris NOT DETECTED NOT DETECTED   Candida glabrata NOT DETECTED NOT DETECTED   Candida krusei NOT DETECTED NOT DETECTED   Candida parapsilosis NOT DETECTED NOT DETECTED   Candida tropicalis NOT DETECTED NOT DETECTED   Cryptococcus neoformans/gattii NOT DETECTED NOT DETECTED   Methicillin resistance mecA/C DETECTED (A) NOT DETECTED    Wynona Neat, PharmD, BCPS  11/23/2021  5:45 AM

## 2021-11-24 DIAGNOSIS — I615 Nontraumatic intracerebral hemorrhage, intraventricular: Secondary | ICD-10-CM | POA: Diagnosis not present

## 2021-11-24 DIAGNOSIS — T827XXA Infection and inflammatory reaction due to other cardiac and vascular devices, implants and grafts, initial encounter: Secondary | ICD-10-CM | POA: Diagnosis not present

## 2021-11-24 DIAGNOSIS — I161 Hypertensive emergency: Secondary | ICD-10-CM

## 2021-11-24 DIAGNOSIS — N186 End stage renal disease: Secondary | ICD-10-CM | POA: Diagnosis not present

## 2021-11-24 DIAGNOSIS — R7881 Bacteremia: Secondary | ICD-10-CM

## 2021-11-24 DIAGNOSIS — I629 Nontraumatic intracranial hemorrhage, unspecified: Secondary | ICD-10-CM | POA: Diagnosis not present

## 2021-11-24 LAB — RENAL FUNCTION PANEL
Albumin: 2.7 g/dL — ABNORMAL LOW (ref 3.5–5.0)
Anion gap: 20 — ABNORMAL HIGH (ref 5–15)
BUN: 61 mg/dL — ABNORMAL HIGH (ref 6–20)
CO2: 19 mmol/L — ABNORMAL LOW (ref 22–32)
Calcium: 8.3 mg/dL — ABNORMAL LOW (ref 8.9–10.3)
Chloride: 97 mmol/L — ABNORMAL LOW (ref 98–111)
Creatinine, Ser: 14.22 mg/dL — ABNORMAL HIGH (ref 0.61–1.24)
GFR, Estimated: 4 mL/min — ABNORMAL LOW (ref >60)
Glucose, Bld: 71 mg/dL (ref 70–99)
Phosphorus: 9 mg/dL — ABNORMAL HIGH (ref 2.5–4.6)
Potassium: 4.5 mmol/L (ref 3.5–5.1)
Sodium: 136 mmol/L (ref 135–145)

## 2021-11-24 LAB — CBC
HCT: 28.4 % — ABNORMAL LOW (ref 39.0–52.0)
Hemoglobin: 8.4 g/dL — ABNORMAL LOW (ref 13.0–17.0)
MCH: 22.5 pg — ABNORMAL LOW (ref 26.0–34.0)
MCHC: 29.6 g/dL — ABNORMAL LOW (ref 30.0–36.0)
MCV: 76.1 fL — ABNORMAL LOW (ref 80.0–100.0)
Platelets: 119 10*3/uL — ABNORMAL LOW (ref 150–400)
RBC: 3.73 MIL/uL — ABNORMAL LOW (ref 4.22–5.81)
RDW: 20.2 % — ABNORMAL HIGH (ref 11.5–15.5)
WBC: 9.7 10*3/uL (ref 4.0–10.5)
nRBC: 0 % (ref 0.0–0.2)

## 2021-11-24 MED ORDER — DARBEPOETIN ALFA 150 MCG/0.3ML IJ SOSY
150.0000 ug | PREFILLED_SYRINGE | INTRAMUSCULAR | Status: DC
  Administered 2021-11-24: 150 ug via INTRAVENOUS
  Filled 2021-11-24 (×2): qty 0.3

## 2021-11-24 MED ORDER — CLONIDINE HCL 0.1 MG PO TABS
0.3000 mg | ORAL_TABLET | Freq: Three times a day (TID) | ORAL | Status: DC
  Administered 2021-11-24 – 2021-12-05 (×29): 0.3 mg via ORAL
  Filled 2021-11-24 (×4): qty 3
  Filled 2021-11-24: qty 1
  Filled 2021-11-24 (×2): qty 3
  Filled 2021-11-24: qty 1
  Filled 2021-11-24 (×7): qty 3
  Filled 2021-11-24: qty 1
  Filled 2021-11-24 (×6): qty 3
  Filled 2021-11-24: qty 1
  Filled 2021-11-24 (×6): qty 3
  Filled 2021-11-24: qty 1
  Filled 2021-11-24: qty 3

## 2021-11-24 MED ORDER — ANTICOAGULANT SODIUM CITRATE 4% (200MG/5ML) IV SOLN: 5.0000 mL | Status: DC | PRN

## 2021-11-24 MED ORDER — ANTICOAGULANT SODIUM CITRATE 4% (200MG/5ML) IV SOLN
5.0000 mL | Status: DC | PRN
  Administered 2021-11-24: 5 mL
  Filled 2021-11-24 (×2): qty 5

## 2021-11-24 MED ORDER — LABETALOL HCL 5 MG/ML IV SOLN
10.0000 mg | INTRAVENOUS | Status: DC | PRN
  Administered 2021-11-25: 20 mg via INTRAVENOUS
  Administered 2021-11-25: 10 mg via INTRAVENOUS
  Filled 2021-11-24 (×3): qty 4

## 2021-11-24 MED ORDER — VANCOMYCIN HCL 750 MG/150ML IV SOLN
750.0000 mg | INTRAVENOUS | Status: DC
  Administered 2021-11-24: 750 mg via INTRAVENOUS
  Filled 2021-11-24: qty 150

## 2021-11-24 MED ORDER — ALTEPLASE 2 MG IJ SOLR: 2.0000 mg | Freq: Once | INTRAMUSCULAR | Status: DC | PRN

## 2021-11-24 MED ORDER — HYDRALAZINE HCL 20 MG/ML IJ SOLN
10.0000 mg | INTRAMUSCULAR | Status: DC | PRN
  Administered 2021-11-25 – 2021-12-01 (×4): 20 mg via INTRAVENOUS
  Administered 2021-12-02: 10 mg via INTRAVENOUS
  Filled 2021-11-24 (×5): qty 1

## 2021-11-24 MED ORDER — METOCLOPRAMIDE HCL 5 MG/ML IJ SOLN
10.0000 mg | Freq: Once | INTRAMUSCULAR | Status: AC
  Administered 2021-11-24: 10 mg via INTRAVENOUS
  Filled 2021-11-24: qty 2

## 2021-11-24 NOTE — Progress Notes (Addendum)
STROKE TEAM PROGRESS NOTE   INTERVAL HISTORY HD tech is at the bedside for HD. Patient in bed, eyes closed, reluctant to open, reluctant to cooperate with exam.  Very irritated, however, awake alert, orientated and moving all extremities, no neuro changes.  Still has fever, repeat blood cultures so far negative.  On vancomycin.  Will consult ID.  Vitals:   11/24/21 1230 11/24/21 1300 11/24/21 1330 11/24/21 1400  BP: (!) 164/80 (!) 176/78 (!) 159/79 (!) 225/88  Pulse: 63 64 64 69  Resp: 10 (!) 0 20 12  Temp:      TempSrc:      SpO2: 97% 98% 94% 100%  Weight:      Height:       CBC:  Recent Labs  Lab 11/22/21 0054 11/24/21 0758  WBC 7.2 9.7  HGB 10.0* 8.4*  HCT 34.2* 28.4*  MCV 76.9* 76.1*  PLT 120* 767*   Basic Metabolic Panel:  Recent Labs  Lab 11/21/21 1117 11/22/21 0054 11/24/21 0758  NA 139 137 136  K 3.4* 4.0 4.5  CL 103 102 97*  CO2 25 20* 19*  GLUCOSE 88 76 71  BUN 28* 32* 61*  CREATININE 7.69* 9.54* 14.22*  CALCIUM 8.4* 8.7* 8.3*  MG 1.9  --   --   PHOS  --   --  9.0*   Lipid Panel:  Recent Labs  Lab 11/21/21 1117  CHOL 94  TRIG 106  HDL 39*  CHOLHDL 2.4  VLDL 21  LDLCALC 34   HgbA1c:  Recent Labs  Lab 11/21/21 1105  HGBA1C 4.5*   Urine Drug Screen: No results for input(s): "LABOPIA", "COCAINSCRNUR", "LABBENZ", "AMPHETMU", "THCU", "LABBARB" in the last 168 hours.  Alcohol Level No results for input(s): "ETH" in the last 168 hours.  IMAGING past 24 hours No results found.  PHYSICAL EXAM General:  Drowsy, well-nourished, well-developed patient s/p removal of left eye in no acute distress Respiratory:  Regular, unlabored respirations  NEURO:  Patient lethargic, reluctant to be cooperative with exam, irritated and combative at the end.  Eyes open on request, awake alert, orientated to age and place.  No aphasia, paucity of speech, hypophonia, follow simple commands.  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  Bilateral upper extremity 3/5, bilateral lower extremity 3-/5.  Sensation symmetrical subjectively and finger-to-nose grossly intact.  Gait not tested   ASSESSMENT/PLAN [name]. is a 51 y.o. 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  He presented to the ED and was found to be hypertensive with SBP in the 200s.  Head CT revealed ICH in the fourth ventricle.  Eliquis was reversed with Andexxa. It is uncertain when this occurred as patient's symptoms had been going on for several days.  ICH score 2  IVH:  IVH at fourth ventricle and lateral ventricles without hydrocephalus, etiology likely related to uncontrolled hypertension CT head IVH within inferior aspect of fourth ventricle CTA head & neck No evidence of aneurysm, no LVO or high grade stenosis MRI  acute hemorrhage in fourth ventricle with layering in occipital horns and mild enlargement of lateral ventricles Follow up CT 10/1 stable IVH Repeat CT 10/4 2D Echo EF 65 to 70%, large pericardial effusion. The pericardial effusion is circumferential. There is no evidence of cardiac tamponade LDL  34 HgbA1c 4.5 VTE prophylaxis - SCDs clopidogrel 75 mg daily and Eliquis (apixaban) daily prior to admission, now on No antithrombotic secondary to IVH Therapy recommendations:  CIR vs. SNF Disposition:  pending  Hypertensive emergency Home meds:  carvedilol 25 mg BID, amlodipine 5 mg daily, clonidine  0.1 mg TID PRN Unstable, requiring Cleviprex Now on amlodipine 10, Coreg 25 twice daily, clonidine 0.1->0.2->0.3 to 8 hours Keep SBP <160 Long-term BP goal normotensive  History of stroke and seizure 12/2020 admitted for syncope and unresponsiveness, CT negative 01/2021 admitted for transient slurred speech.  MRI showed left hippocampus punctate infarct.  MRA and  carotid Doppler unremarkable, EEG no seizure.  LDL 38, A1c 5.7, EF 55 to 60%.  Discharged on Eliquis, Plavix and Lipitor 40 03/2021 admitted for left upper extremity weakness, CT negative.  CTA head and neck poor visualization but no significant finding.  MRI showed bilateral MCA/ACA watershed infarcts as well as left splenium small infarct.  EEG no seizure 06/2021 admitted for jerking movement for weeks.  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  MRI no acute infarct 07/2021 admitted for seizure-like activity in the setting of recent dialysis and glucose 77.  Long-term EEG monitoring no seizure.  Put on Keppra and discontinue Klonopin.  Fevers Persistent fevers 1 of 2 blood cultures growing MRSE, typically a contaminant, however given risk (HD catheter) of real infection and continue fevers will treat with Vanc.  Repeat blood culture so far NGTD ID consulted  Hyperlipidemia Home meds: atorvastatin 80 mg daily LDL 34, goal < 70 Hold off statin for now due to acute IVH Consider to resume statin at discharge  ESRD on HD Patient receives dialysis T, Th, Sat Appreciate nephrology input Continue dialysis on outpatient schedule  Large pericardial effusion Found on both CT chest and 2D echo Likely due to ESRD No tamponade on 2D echo Hemodynamically stable Close monitoring  Other Stroke Risk Factors PVD dCHF  Other Active Problems Nodules in left lung Chest CT shows ground glass nodules in bilateral lungs Recommend follow up chest CT every 6-12 months to monitor nodules  Hospital day # 3   Rosalin Hawking, MD PhD Stroke Neurology 11/24/2021 4:10 PM  This patient is critically ill due to [facility], hypertensive emergency, history of seizure and at significant risk of neurological worsening, death form recurrent IVH, hydrocephalus, hypertensive encephalopathy, status epilepticus. This patient's care requires constant monitoring of vital signs, hemodynamics, respiratory and  cardiac monitoring, review of multiple databases, neurological assessment, discussion with family, other specialists and medical decision making of high complexity. I spent 35 minutes of neurocritical care time in the care of this patient. I discussed with Dr. Gale Journey from ID

## 2021-11-24 NOTE — Progress Notes (Signed)
Received patient in bed to unit.  Alert and oriented.  Informed consent signed and in chart.   Treatment initiated: 0804 Treatment completed: 1206  Patient tolerated well.  Transported back to the room  Alert, without acute distress.  Hand-off given to patient's nurse.   Access used: HD cath Access issues: a little sluggish on pull on the VP  Total UF removed: 3050 ml  Medication(s) given: Vancomycin 750mg  IVPB, Aranesp 110mcg IVP, Na+ citrate dwells 4.62ml Post HD VS: 98.7    175/79    63    10    99% RA Post HD weight: 80.5kg   [facility]

## 2021-11-24 NOTE — Progress Notes (Signed)
Inpatient Rehab Admissions Coordinator:    I spoke with pt.'s wife regarding potential CIR admit. She states that she is not sure that she can arrange 24/7 physical assist for Pt. Following CIR and may want SNF. States she will discuss with family and get back to me tomorrow.  Clemens Catholic, [place], [place] Admissions Coordinator  [phone] ([place]) [phone] (office)

## 2021-11-24 NOTE — Evaluation (Signed)
Speech Language Pathology Evaluation Patient Details Name: [name]. MRN: [id number] DOB: Dec 08, 1970 Today's Date: 11/24/2021 Time: 2585-2778 SLP Time Calculation (min) (ACUTE ONLY): 9 min  Problem List:  Patient Active Problem List   Diagnosis Date Noted   Intracranial bleed ([place]) 11/21/2021   ICH (intracerebral hemorrhage) ([place]) 11/21/2021   Seizure ([place]) 07/26/2021   Seasonal allergic rhinitis 07/08/2021   Immunization due 07/08/2021   Hyperglycemia due to type 2 diabetes mellitus ([place]) 07/08/2021   Diabetic renal disease ([place]) 07/08/2021   Aspiration pneumonia ([place]) 06/27/2021   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 difficile colitis 05/05/2021   Cerebral ischemic stroke due to global hypoperfusion with watershed infarct [facility]) 04/24/2021   Peritonitis ([place]) 04/22/2021   Hypertensive crisis 04/21/2021   Acute Bil  CVAs of Bil Frontal amd Rt Parietal  in Watershed [place] 04/21/2021   Exposure of orbital implant 03/16/2021   CVA (cerebral vascular accident) ([place]) 02/13/2021   Steal syndrome as complication of dialysis access ([place]) 02/06/2021   Folate deficiency 02/06/2021   Hyperkalemia 02/05/2021   Hyperphosphatemia 02/05/2021   Hypermagnesemia 02/05/2021   Coronary artery disease 02/05/2021   Hypernatremia 02/05/2021   Osteomyelitis ([place]) 02/05/2021   Wound infection 02/04/2021   Infected wound 01/29/2021   Wound dehiscence, surgical, initial encounter 01/29/2021   ESRD on hemodialysis (HCC)    Prolonged Q-T interval on ECG    Anemia due to chronic kidney disease, on chronic dialysis ([place])    Atrial fibrillation with RVR ([place]) 12/08/2020   Finger amputation, 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 24/23/5361   Atrial fibrillation with rapid ventricular response (HCC)    Gangrene of finger of right hand ([place]) 11/25/2020   Postural dizziness with presyncope 11/24/2020   Elevated troponin 11/24/2020   Left leg cellulitis 11/24/2020   Frequent falls 11/24/2020   Osteomyelitis of finger of  right hand ([place]) 11/24/2020   Paroxysmal atrial fibrillation ([place]) 11/24/2020   Hypotension 11/24/2020   NSTEMI (non-ST elevated myocardial infarction) ([place]) 11/24/2020   Abnormal gait 08/19/2020   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[place]) 08/19/2020   Anxiety state 08/19/2020   Callosity 08/19/2020   ED (erectile dysfunction) of organic origin 08/19/2020   Heart failure ([place]) 08/19/2020   Long term (current) use of insulin ([place]) 08/19/2020   Myalgia 08/19/2020   Obstructive uropathy 08/19/2020   Reactive depression 08/19/2020   CAD/ H/o prior STEMI 02/2020 s/p DES to LAD 08/19/2020   ESRD (end stage renal disease) ([place]) 08/19/2020   Blindness of left eye 08/15/2020   Combined forms of age-related cataract of right eye 04/17/2020   Supraventricular tachycardia 02/26/2020   Acute anterior wall MI ([place])    Benign prostatic hyperplasia with lower urinary tract symptoms 11/07/2019   Fluid overload, unspecified 08/15/2019   Hypertension secondary to other renal disorders 07/20/2019   NVG (neovascular glaucoma), left, 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   Anemia 06/05/2019   Aftercare including intermittent dialysis ([place]) 05/15/2019   Moderate protein-calorie malnutrition ([place]) 05/14/2019   Anaphylactic reaction due to adverse effect of correct drug or medicament properly administered, initial encounter 05/08/2019   Chronic anemia 05/07/2019   Chest pain, unspecified 05/07/2019   Coagulation defect, unspecified ([place]) 05/07/2019   Disorder of phosphorus metabolism, unspecified 05/07/2019   Encounter for fitting and adjustment of extracorporeal dialysis catheter ([place]) 05/07/2019   Iron deficiency anemia, unspecified 05/07/2019   Pain, unspecified 05/07/2019   Pruritus, unspecified 05/07/2019   Dizziness 44/31/5400   Metabolic acidosis 86/76/1950   Syncope 04/06/2019   Urinary retention 04/06/2019  Hypertensive chronic kidney disease with stage 1 through stage 4 chronic kidney disease, or  unspecified chronic kidney disease 11/28/2018   Edema 11/28/2018   Hypokalemia 11/28/2018   Hypo-osmolality and hyponatremia 11/28/2018   Proteinuria 11/28/2018   Secondary hyperparathyroidism of renal origin ([place]) 11/28/2018   Lymphedema 01/09/2018   Chronic Grade 2 diastolic heart failure/EF 55 to 60% 12/13/2017   Vomiting 11/28/2017   Acute on chronic heart failure ([place]) 11/23/2017   Diabetic retinopathy ([place]) 07/05/2017   Uncontrolled type 2 diabetes mellitus with hyperglycemia, with long-term current use of insulin ([place]) 04/20/2016   Osteomyelitis of toe of left foot ([place]) 03/15/2016   Acute renal failure with tubular necrosis ([place]) 06/23/2015   Diarrhea 06/23/2015   Mixed hyperlipidemia    Essential hypertension, benign    Heart palpitations 05/06/2015   Sepsis ([place]) 03/07/2015   Past Medical History:  Past Medical History:  Diagnosis Date   Anxiety    a.) on BZO (clonazepam) PRN   Aortic dilatation ([place]) 11/25/2020   a.) TTE 11/25/2020: Ao root measured 38 mm. b.) TTE 04/22/2021: Ao root measured 44 mm.   Atrial fibrillation ([place])    a.) CHA2DS2-VASc = 6 (CHF, HTN, CVA x2, prior MI, T2DM). b.) rate/rhythm maintained on oral amiodarone + carvedilol; chronically anticoagulated using dose reduced apixaban + clopidogrel   BPH (benign prostatic hyperplasia)    Cardiac arrest ([place]) 07/26/2021   a.) in setting of hyperkalemia, NSTEMI, and seizure following missed HD; pulseless and apneic --> required 1 round of CPR prior to ROSC   Cellulitis    CHF (congestive heart failure) ([place]) 07/19/2017   a.) TTE 07/19/2017: EF 75%; sev LVH; mild MR/PR/TR; G1DD. b.) TTE 11/25/2020: EF 55-60%; mod LVH; GLS -13.0; mild-mod BAE; triv MR; mild dilated PA; G2DD. c.) TTE 04/22/2021: EF 55-60%; sev LVH; sev LAE, triv MR; G1DD.   Chronic diarrhea    Chronic pain of both ankles    Coronary artery disease 02/26/2020   a.) anterior STEMI 02/26/2020 --> LHC/PCI --> EF 65%; 90% mLAD (3.5 x 30 mm  Resolyte Onyx DES x 1). b.) NSTEMI 07/26/2021   Diabetes mellitus, type II ([place])    Diabetic neuropathy (HCC)    Esophagitis    ESRD (end stage renal disease) on dialysis ([place])    a.) [place]; T-Th-Sat   Frequent falls    Gait instability    Gastritis    Gastroparesis    GERD (gastroesophageal reflux disease)    H/O enucleation of left eyeball    Heart palpitations    History of kidney stones    HLD (hyperlipidemia)    Hypertension    Insomnia    a.) uses melatonin PRN   Long term current use of anticoagulant    a.) dose reduced apixaban   Long term current use of antithrombotics/antiplatelets    a.) clopidogrel   Nausea and vomiting in adult    recurrent   NSTEMI (non-ST elevated myocardial infarction) ([place]) 07/26/2021   a.) hyperkalemic from missed HD; seizures; decompensated to cardiac arrest and required CPR x 1 round prior to ROSC.   Seizure ([place])    a.) last 07/26/2021 in setting of missed HD --> hyperkalemic at 6.5 --> pulseless/apneic and required CPR; discharged home on levetiracetam.   ST elevation myocardial infarction (STEMI) of anterior wall ([place]) 02/26/2020   a.) LHC/PCI 02/26/2020 --> EF 65%; LVEDP 11 mmHg; 90% mLAD (3.5 x 20 mm Resolute Onyx DES x 1)   Stroke ([place]) 04/2021   a.) documented  as x 3 between 04/2021 and 06/2021   Past Surgical History:  Past Surgical History:  Procedure Laterality Date   A/V FISTULAGRAM Left 08/31/2021   Procedure: A/V Fistulagram;  Surgeon: Algernon Huxley, MD;  Location: [facility];  Service: Cardiovascular;  Laterality: Left;   AMPUTATION Left 03/16/2016   Procedure: AMPUTATION DIGIT LEFT HALLUX;  Surgeon: Trula Slade, DPM;  Location: [facility];  Service: Podiatry;  Laterality: Left;  can start around 5    AMPUTATION Right 02/09/2021   Procedure: AMPUTATION DIGIT;  Surgeon: Sherilyn Cooter, MD;  Location: [facility];  Service: Orthopedics;  Laterality: Right;   ARTHROSCOPIC REPAIR ACL Left    AV FISTULA PLACEMENT Right  05/31/2019   Procedure: Brachiocephalic AV fistula creation;  Surgeon: Algernon Huxley, MD;  Location: [name];  Service: Vascular;  Laterality: Right;   AV FISTULA PLACEMENT Left 08/13/2021   Procedure: ARTERIOVENOUS (AV) FISTULA CREATION (BRACHIALCEPHALIC);  Surgeon: Algernon Huxley, MD;  Location: [name];  Service: Vascular;  Laterality: Left;   COLONOSCOPY WITH PROPOFOL N/A 10/28/2015   Procedure: COLONOSCOPY WITH PROPOFOL;  Surgeon: Lollie Sails, MD;  Location: [facility];  Service: Endoscopy;  Laterality: N/A;   COLONOSCOPY WITH PROPOFOL N/A 10/29/2015   Procedure: COLONOSCOPY WITH PROPOFOL;  Surgeon: Lollie Sails, MD;  Location: [facility];  Service: Endoscopy;  Laterality: N/A;   CORONARY/GRAFT ACUTE MI REVASCULARIZATION N/A 02/26/2020   Procedure: Coronary/Graft Acute MI Revascularization;  Surgeon: Jettie Booze, MD;  Location: [facility];  Service: Cardiovascular;  Laterality: N/A;   DIALYSIS/PERMA CATHETER INSERTION Right 04/26/2019   Perm Cath    DIALYSIS/PERMA CATHETER INSERTION N/A 04/26/2019   Procedure: DIALYSIS/PERMA CATHETER INSERTION;  Surgeon: Algernon Huxley, MD;  Location: [facility];  Service: Cardiovascular;  Laterality: N/A;   DIALYSIS/PERMA CATHETER INSERTION N/A 05/25/2021   Procedure: DIALYSIS/PERMA CATHETER INSERTION;  Surgeon: Algernon Huxley, MD;  Location: [facility];  Service: Cardiovascular;  Laterality: N/A;   DIALYSIS/PERMA CATHETER INSERTION N/A 08/31/2021   Procedure: DIALYSIS/PERMA CATHETER INSERTION;  Surgeon: Algernon Huxley, MD;  Location: [facility];  Service: Cardiovascular;  Laterality: N/A;   DIALYSIS/PERMA CATHETER REMOVAL N/A 08/15/2019   Procedure: DIALYSIS/PERMA CATHETER REMOVAL;  Surgeon: Katha Cabal, MD;  Location: [facility];  Service: Cardiovascular;  Laterality: N/A;   EMBOLIZATION Right 02/06/2021   Procedure: EMBOLIZATION;  Surgeon: Algernon Huxley, MD;  Location: [facility];  Service: Cardiovascular;  Laterality: Right;  Right Upper Extremity Dialysis Access, Permcath Placement   ESOPHAGOGASTRODUODENOSCOPY (EGD) WITH PROPOFOL N/A 12/27/2017   Procedure: ESOPHAGOGASTRODUODENOSCOPY (EGD) WITH PROPOFOL;  Surgeon: Toledo, Benay Pike, MD;  Location: [name] ENDOSCOPY;  Service: Gastroenterology;  Laterality: N/A;   EYE SURGERY     INTRAVASCULAR ULTRASOUND/IVUS N/A 02/26/2020   Procedure: Intravascular Ultrasound/IVUS;  Surgeon: Jettie Booze, MD;  Location: [facility];  Service: Cardiovascular;  Laterality: N/A;   LEFT HEART CATH AND CORONARY ANGIOGRAPHY N/A 02/26/2020   Procedure: LEFT HEART CATH AND CORONARY ANGIOGRAPHY;  Surgeon: Jettie Booze, MD;  Location: [facility];  Service: Cardiovascular;  Laterality: N/A;   PROSTATE SURGERY  2016   TONSILECTOMY/ADENOIDECTOMY WITH MYRINGOTOMY     TONSILLECTOMY     UPPER EXTREMITY ANGIOGRAPHY Right 11/26/2020   Procedure: Upper Extremity Angiography;  Surgeon: Algernon Huxley, MD;  Location: [facility];  Service: Cardiovascular;  Laterality: Right;   UPPER EXTREMITY ANGIOGRAPHY Right 02/05/2021  Procedure: UPPER EXTREMITY ANGIOGRAPHY;  Surgeon: Algernon Huxley, MD;  Location: [facility];  Service: Cardiovascular;  Laterality: Right;   HPI:  51 yo male admitted 9/30 with generalized weakness, found to have ICH 4th ventricle and lateral ventricles. Pt was seen by SLP on CIR in March 2023, discharging at a supervision level for cognition. PMH: ESRD TTS, HTN, amputation L big toe, L eye absent, CVA, afib on plavix and Eliquis, Glaucoma, seizure, PVD, CHF   Assessment / Plan / Recommendation Clinical Impression  Pt was initially agreeable to cognitive-linguistic assessment but after orientation questions did not respond to any other questions on the SLUMS. Unable to obtain score; however, did try to elicit baseline level of function. Per SLP notes in March, pt was at a  supervision level for cognitive skills, which is in line with what pt reports - that he still is involved in managing his own medications, appointments, and bill paying. Will need to confirm with family. He is disoriented to time and does not tell me why he is in the hospital. He also does not tell me any of his acute changes, even when asked more specifically about weakness (as he reportedly came in initially with generalized weakness). Although pt may be presenting with some cognitive changes, there are no overt language or speech deficits in limited sampling. Pt will need additional f/u to better establigh current and previous level of function, but based on his report today, there may be evidence of an acute change. Family may likely be able to support differential diagnosis further.    SLP Assessment  SLP Recommendation/Assessment: Patient needs continued Speech [place] [facility] SLP Visit Diagnosis: Cognitive communication deficit (R41.841)    Recommendations for follow up therapy are one component of a multi-disciplinary discharge planning process, led by the attending physician.  Recommendations may be updated based on patient status, additional functional criteria and insurance authorization.    Follow Up Recommendations  Acute inpatient rehab (3hours/day)    Assistance Recommended at Discharge  Frequent or constant Supervision/Assistance  Functional Status Assessment Patient has had a recent decline in their functional status and demonstrates the ability to make significant improvements in function in a reasonable and predictable amount of time.  Frequency and Duration min 2x/week  2 weeks      SLP Evaluation Cognition  Overall Cognitive Status: Difficult to assess Arousal/Alertness: Awake/alert Orientation Level: Oriented to person;Oriented to place;Disoriented to time Attention: Sustained Sustained Attention: Impaired Sustained Attention Impairment: Verbal  basic Awareness: Impaired Awareness Impairment: Intellectual impairment Behaviors: Poor frustration tolerance       Comprehension  Auditory Comprehension Overall Auditory Comprehension:  (TBA further)    Expression Expression Primary Mode of Expression: Verbal Verbal Expression Overall Verbal Expression: Other (comment) (TBA further - no overt language deficits)   Oral / Motor  Motor Speech Overall Motor Speech: Other (comment) (TBA further - no overt dysarthria)            Osie Bond., M.A. [place] Office [phone]  Secure chat preferred  11/24/2021, 11:00 AM

## 2021-11-24 NOTE — Progress Notes (Signed)
CSW following for potential SNF placement if family unable to provide assist needed for CIR.   Gilmore Laroche, MSW, [facility]

## 2021-11-24 NOTE — TOC CAGE-AID Note (Signed)
Transition of Care (TOC) - CAGE-AID Screening   Patient Details  Name: [name]. MRN: [id number] Date of Birth: Dec 18, 1970  [facility] of [facility]) CM/SW Contact:    Coralee Pesa, [place] Phone Number: 11/24/2021, 9:59 AM   Clinical Narrative: Per chart review, pt not oriented, not appropriate for CAGE AID assessment.   CAGE-AID Screening: Substance Abuse Screening unable to be completed due to: : Patient unable to participate             Substance Abuse Education Offered: No

## 2021-11-24 NOTE — Consult Note (Signed)
[facility]    Date of Admission:  11/21/2021     Reason for Consult: Staph bacteremia, fevers     Referring Physician: Dr Erlinda Hong  Current antibiotics: Vancomycin   ASSESSMENT:    51 y.o. male admitted with:  # Fevers # Staph epidermidis positive blood culture # IVH # ESRD  Patient presenting with IVH likely related to uncontrolled HTN.  Also had a few days of malaise, nausea, vomiting.  Found to have fevers upon admission as well which prompted blood cultures.  Unfortunately, only 1 set of cultures obtained at the start with 1 of 2 bottles yielding Staph epi.  This could certainly be a contaminant.  He had repeat blood cultures obtained as well prior to any antibiotics being given which are NGTD.  In the setting of IVH, would consider central fever.  However, patient with risk factors for true MRSE bacteremia most notably a tunneled HD line.  RECOMMENDATIONS:    Will continue vancomycin for now If repeat cultures remain negative, would consider this likely a contaminated culture since the repeats were done prior to any antibiotics If repeat cultures turn positive, will need central line removed and further work up pursued  Will follow   Principal Problem:   Intracranial bleed ([place]) Active Problems:   ICH (intracerebral hemorrhage) ([place])   MEDICATIONS:    Scheduled Meds:  amiodarone  200 mg Oral Daily   amLODipine  10 mg Oral Daily   calcium acetate  1,334 mg Oral TID WC   carvedilol  25 mg Oral BID   Chlorhexidine Gluconate Cloth  6 each Topical Daily   cloNIDine  0.2 mg Oral Q8H   darbepoetin (ARANESP) injection - DIALYSIS  150 mcg Intravenous Q Tue-HD   dorzolamide-timolol  1 drop Right Eye BID   levETIRAcetam  250 mg Oral Q T,Th,Sa-HD   levETIRAcetam  500 mg Oral BID   pantoprazole  40 mg Oral Daily   prednisoLONE acetate  1 drop Left Eye Q4H while awake   senna-docusate  1 tablet Oral BID   tamsulosin  0.4 mg Oral Daily    Continuous Infusions:  anticoagulant sodium citrate     anticoagulant sodium citrate     clevidipine 7 mg/hr (11/24/21 0600)   vancomycin 750 mg (11/24/21 1104)   PRN Meds:.acetaminophen **OR** acetaminophen (TYLENOL) oral liquid 160 mg/5 mL **OR** acetaminophen, alteplase, alteplase, anticoagulant sodium citrate, anticoagulant sodium citrate, hydrALAZINE, labetalol  HPI:    Roald Jawon Dipiero. is a 51 y.o. male with a past medical history of atrial fibrillation, hypertension, end-stage renal disease, CHF, history of stroke who presented 11/21/2021 with general malaise and feeling unwell for 3 to 4 days prior to admission.  In the emergency department he was noted to be severely hypertensive with a systolic blood pressure in the 200s.  Given his overall symptoms of unknown while feeling, nausea, vomiting, hypertension a CT was obtained in the emergency department which showed a fourth ventricular bleed.  He was transferred to [facility] with acute intraventricular hemorrhage.  Neurology was consulted.  Patient did have some low-grade fever and cough prior to admission so it was thought that maybe a mild URI was responsible for those symptoms.  On admission, patient had a respiratory panel for COVID and flu that was negative.  MRSA PCR negative.  Blood cultures 11/22/2021 drawn from his left arm positive for methicillin-resistant Staph epidermidis.  He was started on vancomycin starting on 10/2.  Repeat blood cultures were  obtained prior to any doses of vancomycin.  These are currently no growth.  Patient had a echocardiogram showing a pericardial effusion with no evidence of tamponade.  In addition to his head imaging patient had a chest x-ray that showed some cardiomegaly but otherwise clear lungs.  Of note, patient is on hemodialysis currently via a right IJ [facility] that was placed sometime in July.  He has a left brachiocephalic fistula that, per nephrology, appears to be poorly developed and is not  currently being used.  Patient reports no issues with his right IJ.  During his hospitalization patient has had low-grade fevers ranging from 99 F upwards of 102.6 degrees noted yesterday.  His Tmax over the last 24 hours is 102.6 which was yesterday around 12 PM.  He otherwise has no significant localizing symptoms.  He does report some low back pain when he coughs but that is about it.   Past Medical History:  Diagnosis Date   Anxiety    a.) on BZO (clonazepam) PRN   Aortic dilatation ([place]) 11/25/2020   a.) TTE 11/25/2020: Ao root measured 38 mm. b.) TTE 04/22/2021: Ao root measured 44 mm.   Atrial fibrillation ([place])    a.) CHA2DS2-VASc = 6 (CHF, HTN, CVA x2, prior MI, T2DM). b.) rate/rhythm maintained on oral amiodarone + carvedilol; chronically anticoagulated using dose reduced apixaban + clopidogrel   BPH (benign prostatic hyperplasia)    Cardiac arrest ([place]) 07/26/2021   a.) in setting of hyperkalemia, NSTEMI, and seizure following missed HD; pulseless and apneic --> required 1 round of CPR prior to ROSC   Cellulitis    CHF (congestive heart failure) ([place]) 07/19/2017   a.) TTE 07/19/2017: EF 75%; sev LVH; mild MR/PR/TR; G1DD. b.) TTE 11/25/2020: EF 55-60%; mod LVH; GLS -13.0; mild-mod BAE; triv MR; mild dilated PA; G2DD. c.) TTE 04/22/2021: EF 55-60%; sev LVH; sev LAE, triv MR; G1DD.   Chronic diarrhea    Chronic pain of both ankles    Coronary artery disease 02/26/2020   a.) anterior STEMI 02/26/2020 --> LHC/PCI --> EF 65%; 90% mLAD (3.5 x 30 mm Resolyte Onyx DES x 1). b.) NSTEMI 07/26/2021   Diabetes mellitus, type II ([place])    Diabetic neuropathy (HCC)    Esophagitis    ESRD (end stage renal disease) on dialysis ([place])    a.) [facility]; T-Th-Sat   Frequent falls    Gait instability    Gastritis    Gastroparesis    GERD (gastroesophageal reflux disease)    H/O enucleation of left eyeball    Heart palpitations    History of kidney stones    HLD (hyperlipidemia)    Hypertension     Insomnia    a.) uses melatonin PRN   Long term current use of anticoagulant    a.) dose reduced apixaban   Long term current use of antithrombotics/antiplatelets    a.) clopidogrel   Nausea and vomiting in adult    recurrent   NSTEMI (non-ST elevated myocardial infarction) ([place]) 07/26/2021   a.) hyperkalemic from missed HD; seizures; decompensated to cardiac arrest and required CPR x 1 round prior to ROSC.   Seizure ([place])    a.) last 07/26/2021 in setting of missed HD --> hyperkalemic at 6.5 --> pulseless/apneic and required CPR; discharged home on levetiracetam.   ST elevation myocardial infarction (STEMI) of anterior wall ([place]) 02/26/2020   a.) LHC/PCI 02/26/2020 --> EF 65%; LVEDP 11 mmHg; 90% mLAD (3.5 x 20 mm Resolute Onyx DES x 1)  Stroke ([place]) 04/2021   a.) documented as x 3 between 04/2021 and 06/2021    Social History   Tobacco Use   Smoking status: Never   Smokeless tobacco: Never  Vaping Use   Vaping Use: Never used  Substance Use Topics   Alcohol use: No   Drug use: No    Family History  Problem Relation Age of Onset   CAD Father    Stroke Father    Diabetes Mellitus II Mother    Kidney failure Mother    Schizophrenia Mother     Allergies  Allergen Reactions   Promethazine Diarrhea and Other (See Comments)    Muscle cramps   Promethazine Hcl Other (See Comments)    Cramping all over  Other reaction(s): cramp all over   Other     Other reaction(s): Unknown Other reaction(s): Unknown   Malt Other (See Comments)    Cramping    Review of Systems  All other systems reviewed and are negative.  Except as noted above.   OBJECTIVE:   Blood pressure (!) 174/88, pulse 63, temperature 99.6 F (37.6 C), temperature source Oral, resp. rate (!) 5, height 6\' 3"  (1.905 m), weight 81.1 kg, SpO2 98 %. Body mass index is 22.35 kg/m.  Physical Exam Constitutional:      General: He is not in acute distress.    Appearance: Normal appearance.  HENT:      Head: Normocephalic and atraumatic.  Eyes:     Extraocular Movements: Extraocular movements intact.     Conjunctiva/sclera: Conjunctivae normal.  Cardiovascular:     Rate and Rhythm: Normal rate and regular rhythm.     Comments: Currently getting dialyzed.  Right IJ TDC in place.  No TTP.  Pulmonary:     Effort: Pulmonary effort is normal. No respiratory distress.     Breath sounds: Normal breath sounds.  Abdominal:     General: There is no distension.     Palpations: Abdomen is soft.  Musculoskeletal:        General: Normal range of motion.     Cervical back: Normal range of motion and neck supple.  Skin:    General: Skin is warm and dry.     Findings: No rash.  Neurological:     Mental Status: He is alert.     Comments: Alert and oriented to person and time.  Thought he was in [place], [place].  Psychiatric:        Mood and Affect: Mood normal.        Behavior: Behavior normal.      Lab Results: Lab Results  Component Value Date   WBC 9.7 11/24/2021   HGB 8.4 (L) 11/24/2021   HCT 28.4 (L) 11/24/2021   MCV 76.1 (L) 11/24/2021   PLT 119 (L) 11/24/2021    Lab Results  Component Value Date   NA 136 11/24/2021   K 4.5 11/24/2021   CO2 19 (L) 11/24/2021   GLUCOSE 71 11/24/2021   BUN 61 (H) 11/24/2021   CREATININE 14.22 (H) 11/24/2021   CALCIUM 8.3 (L) 11/24/2021   GFRNONAA 4 (L) 11/24/2021   GFRAA 5 (L) 08/27/2019    Lab Results  Component Value Date   ALT 8 11/21/2021   AST 12 (L) 11/21/2021   ALKPHOS 110 11/21/2021   BILITOT 1.2 11/21/2021       Component Value Date/Time   CRP 3.6 (H) 02/05/2021 0143       Component Value Date/Time   ESRSEDRATE 97 (  H) 02/05/2021 0143    I have reviewed the micro and lab results in Epic.  Imaging: CT HEAD WO CONTRAST (5MM)  Result Date: 11/22/2021 CLINICAL DATA:  Stroke, hemorrhagic EXAM: CT HEAD WITHOUT CONTRAST TECHNIQUE: Contiguous axial images were obtained from the base of the skull through the vertex without  intravenous contrast. RADIATION DOSE REDUCTION: This exam was performed according to the departmental dose-optimization program which includes automated exposure control, adjustment of the mA and/or kV according to patient size and/or use of iterative reconstruction technique. COMPARISON:  11/21/2021 CT head, correlation is also made with 11/21/2021 MRI head and CT head 07/26/2021 FINDINGS: Brain: Redemonstrated hyperdense hemorrhage in the inferior fourth ventricle, extending through the foramen of [place], and hypodense hemorrhage layering in the bilateral occipital horns, unchanged from the prior exam. No additional acute hemorrhage is seen. No evidence of acute infarction, mass, mass effect, or midline shift. No additional extra-axial fluid collection. Unchanged mild ventricular enlargement compared to more remote head CTs, but unchanged compared to 11/21/2021. Vascular: No hyperdense vessel. Atherosclerotic calcifications in the intracranial carotid and vertebral arteries. Skull: Normal. Negative for fracture or focal lesion. Sinuses/Orbits: Mild mucosal thickening in the maxillary sinuses, sphenoid sinuses, and ethmoid air cells. Redemonstrated left enucleation. Unchanged appearance of the right globe, status post right lens replacement. Other: The mastoid air cells are well aerated. IMPRESSION: 1. Unchanged hemorrhage in the inferior fourth ventricle, extending through the foramen of [place], and hypodense hemorrhage layering in the bilateral occipital horns. Unchanged size and configuration of the ventricular system compared to 11/21/2021. 2. No additional acute intracranial process. Electronically Signed   By: Merilyn Baba M.D.   On: 11/22/2021 19:58   ECHOCARDIOGRAM COMPLETE  Result Date: 11/22/2021    ECHOCARDIOGRAM REPORT   Patient Name:   Trace Wirick. Date of Exam: 11/22/2021 Medical Rec #:  [id number]            Height:       75.0 in Accession #:    [id number]           Weight:       187.0  lb Date of Birth:  02/20/1971             BSA:          2.133 m Patient Age:    53 years             BP:           124/54 mmHg Patient Gender: M                    HR:           68 bpm. Exam Location:  Inpatient Procedure: 2D Echo, Cardiac Doppler and Color Doppler Indications:    I51.7 Cardiomegaly  History:        Patient has prior history of Echocardiogram examinations, most                 recent 04/22/2021. Cardiomegaly; Risk Factors:Hypertension,                 Diabetes and Dyslipidemia. H/O prior STEMI 02/2020 s/p DES to                 LAD, ESRD on hemodialysis [facility]), CVA (cerebral vascular                 accident) ([place]).  Sonographer:    Alvino Chapel RCS Referring Phys: [phone] MCNEILL P [place]  1.  Left ventricular ejection fraction, by estimation, is 65 to 70%. The left ventricle has normal function. The left ventricle has no regional wall motion abnormalities. There is severe concentric left ventricular hypertrophy. Left ventricular diastolic  parameters are consistent with Grade II diastolic dysfunction (pseudonormalization).  2. Right ventricular systolic function is normal. The right ventricular size is normal. Tricuspid regurgitation signal is inadequate for assessing PA pressure.  3. Left atrial size was severely dilated.  4. Large pericardial effusion. The pericardial effusion is circumferential. There is no evidence of cardiac tamponade.  5. The mitral valve is normal in structure. Trivial mitral valve regurgitation.  6. The aortic valve is tricuspid. Aortic valve regurgitation is not visualized. Aortic valve sclerosis is present, with no evidence of aortic valve stenosis.  7. Aortic dilatation noted. There is mild dilatation of the aortic root, measuring 40 mm.  8. The inferior vena cava is dilated in size with >50% respiratory variability, suggesting right atrial pressure of 8 mmHg. Comparison(s): When compared to prior TTE on 04/2021, the pericardial effusion has now increased in  size and is more prominent around the RA. There is no evidence of tamponade. FINDINGS  Left Ventricle: Left ventricular ejection fraction, by estimation, is 65 to 70%. The left ventricle has normal function. The left ventricle has no regional wall motion abnormalities. The left ventricular internal cavity size was normal in size. There is  severe concentric left ventricular hypertrophy. Left ventricular diastolic parameters are consistent with Grade II diastolic dysfunction (pseudonormalization). Right Ventricle: The right ventricular size is normal. No increase in right ventricular wall thickness. Right ventricular systolic function is normal. Tricuspid regurgitation signal is inadequate for assessing PA pressure. Left Atrium: Left atrial size was severely dilated. Right Atrium: Right atrial size was normal in size. Pericardium: A large pericardial effusion is present. The pericardial effusion is circumferential. There is no evidence of cardiac tamponade. Mitral Valve: The mitral valve is normal in structure. Trivial mitral valve regurgitation. Tricuspid Valve: The tricuspid valve is normal in structure. Tricuspid valve regurgitation is trivial. Aortic Valve: The aortic valve is tricuspid. Aortic valve regurgitation is not visualized. Aortic valve sclerosis is present, with no evidence of aortic valve stenosis. Aortic valve mean gradient measures 7.0 mmHg. Aortic valve peak gradient measures 10.9 mmHg. Aortic valve area, by VTI measures 4.28 cm. Pulmonic Valve: The pulmonic valve was normal in structure. Pulmonic valve regurgitation is trivial. Aorta: Aortic dilatation noted. There is mild dilatation of the aortic root, measuring 40 mm. Venous: The inferior vena cava is dilated in size with greater than 50% respiratory variability, suggesting right atrial pressure of 8 mmHg. IAS/Shunts: The atrial septum is grossly normal.  LEFT VENTRICLE PLAX 2D LVIDd:         5.30 cm   Diastology LVIDs:         2.80 cm   LV e'  medial:    5.22 cm/s LV PW:         1.80 cm   LV E/e' medial:  15.8 LV IVS:        1.90 cm   LV e' lateral:   7.18 cm/s LVOT diam:     2.60 cm   LV E/e' lateral: 11.5 LV SV:         159 LV SV Index:   75 LVOT Area:     5.31 cm  RIGHT VENTRICLE RV S prime:     14.00 cm/s TAPSE (M-mode): 2.2 cm LEFT ATRIUM  Index        RIGHT ATRIUM           Index LA diam:        3.90 cm  1.83 cm/m   RA Area:     21.40 cm LA Vol (A2C):   132.0 ml 61.90 ml/m  RA Volume:   68.40 ml  32.07 ml/m LA Vol (A4C):   152.0 ml 71.27 ml/m LA Biplane Vol: 143.0 ml 67.05 ml/m  AORTIC VALVE AV Area (Vmax):    4.70 cm AV Area (Vmean):   4.23 cm AV Area (VTI):     4.28 cm AV Vmax:           165.00 cm/s AV Vmean:          122.000 cm/s AV VTI:            0.372 m AV Peak Grad:      10.9 mmHg AV Mean Grad:      7.0 mmHg LVOT Vmax:         146.00 cm/s LVOT Vmean:        97.300 cm/s LVOT VTI:          0.300 m LVOT/AV VTI ratio: 0.81  AORTA Ao Root diam: 4.00 cm MITRAL VALVE MV Area (PHT): 2.91 cm    SHUNTS MV Decel Time: 261 msec    Systemic VTI:  0.30 m MV E velocity: 82.30 cm/s  Systemic Diam: 2.60 cm MV A velocity: 80.80 cm/s MV E/A ratio:  1.02 Gwyndolyn Kaufman MD Electronically signed by Gwyndolyn Kaufman MD Signature Date/Time: 11/22/2021/3:16:41 PM    Final      Imaging independently reviewed in Epic.  [facility] [phone] pager 11/24/2021, 11:17 AM

## 2021-11-24 NOTE — Progress Notes (Signed)
Patient ID: [name]., male   DOB: 1970/07/10, 51 y.o.   MRN: [id number] [facility] Progress Note   Assessment/ Plan:   1.  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  Not on antithrombotic therapy with ICH. 2. ESRD: Ongoing hemodialysis today to continue his outpatient TTS schedule via right IJ TDC.  His left brachiocephalic fistula appears to be poorly developed and based on exam, possibly with inflow stenosis versus a collateral vein that will be addressed las an OP. 3. Anemia: Hgb currently lower without any overt losses, begin ESA.   4. CKD-MBD: Calcium level acceptable, will monitor phosphorus trends. 5. Nutrition: Continue current renal diet/fluid restriction with monitoring albumin levels to decide on need for nutritional supplementation. 6. Hypertension: Blood pressure appears to be currently under good control with ongoing amlodipine, carvedilol and clevidipine.  Subjective:   No acute complaints overnight   Objective:   BP (!) 176/75 (BP Location: Right Leg) Comment (BP Location): ankle  Pulse 65   Temp 99.6 F (37.6 C) (Oral)   Resp 16   Ht 6\' 3"  (1.905 m)   Wt 81.1 kg   SpO2 99%   BMI 22.35 kg/m   Physical Exam: Gen: Appears comfortable while on HD (UF goal increased to 3L) CVS: Pulse regular rhythm, normal rate, S1 and S2 normal Resp: Clear to auscultation bilaterally, no distinct rales or rhonchi.  Right IJ TDC Abd: Soft, flat, nontender, bowel sounds normal Ext: No lower extremity edema.  Status post left foot hallux amputation.  Left brachiocephalic fistula with poor augmentation and thrill in outflow.  Labs: BMET Recent Labs  Lab 11/21/21 1117 11/22/21 0054 11/24/21 0758  NA 139 137 136  K 3.4* 4.0 4.5  CL 103 102 97*  CO2 25 20* 19*  GLUCOSE 88 76 71  BUN 28* 32* 61*  CREATININE 7.69* 9.54* 14.22*  CALCIUM 8.4*  8.7* 8.3*  PHOS  --   --  9.0*   CBC Recent Labs  Lab 11/21/21 1105 11/22/21 0054 11/24/21 0758  WBC 6.6 7.2 9.7  HGB 9.8* 10.0* 8.4*  HCT 32.6* 34.2* 28.4*  MCV 74.3* 76.9* 76.1*  PLT 140* 120* 119*     Medications:     amiodarone  200 mg Oral Daily   amLODipine  10 mg Oral Daily   calcium acetate  1,334 mg Oral TID WC   carvedilol  25 mg Oral BID   Chlorhexidine Gluconate Cloth  6 each Topical Daily   cloNIDine  0.2 mg Oral Q8H   dorzolamide-timolol  1 drop Right Eye BID   levETIRAcetam  250 mg Oral Q T,Th,Sa-HD   levETIRAcetam  500 mg Oral BID   pantoprazole  40 mg Oral Daily   prednisoLONE acetate  1 drop Left Eye Q4H while awake   senna-docusate  1 tablet Oral BID   tamsulosin  0.4 mg Oral Daily   Elmarie Shiley, MD 11/24/2021, 10:06 AM

## 2021-11-24 NOTE — NC FL2 (Signed)
[place] LEVEL OF CARE SCREENING TOOL     IDENTIFICATION  Patient Name: [name]. Birthdate: [birth year] Sex: male Admission Date (Current Location): 11/21/2021  [place] and [place] Number:  [employer] and Address:  [facility] [place], [place] [address], [place], [place] [address]      Provider Number: [id number]  Attending Physician Name and Address:  Stroke, Md, MD  Relative Name and Phone Number:       Current Level of Care: Hospital Recommended Level of Care: [place] Prior Approval Number:    Date Approved/Denied:   PASRR Number: [id number]  Discharge Plan: SNF    Current Diagnoses: Patient Active Problem List   Diagnosis Date Noted   Hypertensive emergency    Bacteremia associated with intravascular line ([place])    Intracranial bleed ([place]) 11/21/2021   ICH (intracerebral hemorrhage) ([place]) 11/21/2021   Seizure ([place]) 07/26/2021   Seasonal allergic rhinitis 07/08/2021   Immunization due 07/08/2021   Hyperglycemia due to type 2 diabetes mellitus ([place]) 07/08/2021   Diabetic renal disease ([place]) 07/08/2021   Aspiration pneumonia ([place]) 06/27/2021   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 difficile colitis 05/05/2021   Cerebral ischemic stroke due to global hypoperfusion with watershed infarct [facility]) 04/24/2021   Peritonitis ([place]) 04/22/2021   Hypertensive crisis 04/21/2021   Acute Bil  CVAs of Bil Frontal amd Rt Parietal  in [place] [employer] 04/21/2021   Exposure of orbital implant 03/16/2021   CVA (cerebral vascular accident) ([place]) 02/13/2021   Steal syndrome as complication of dialysis access ([place]) 02/06/2021   Folate deficiency 02/06/2021   Hyperkalemia 02/05/2021   Hyperphosphatemia 02/05/2021   Hypermagnesemia 02/05/2021   Coronary artery disease 02/05/2021   Hypernatremia 02/05/2021   Osteomyelitis ([place]) 02/05/2021   Wound infection 02/04/2021   Infected wound 01/29/2021   Wound dehiscence,  surgical, initial encounter 01/29/2021   ESRD on hemodialysis (HCC)    Prolonged Q-T interval on ECG    Anemia due to chronic kidney disease, on chronic dialysis ([place])    Atrial fibrillation with RVR ([place]) 12/08/2020   Finger amputation, 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 29/79/8921   Atrial fibrillation with rapid ventricular response (HCC)    Gangrene of finger of right hand ([place]) 11/25/2020   Postural dizziness with presyncope 11/24/2020   Elevated troponin 11/24/2020   Left leg cellulitis 11/24/2020   Frequent falls 11/24/2020   Osteomyelitis of finger of right hand ([place]) 11/24/2020   Paroxysmal atrial fibrillation ([place]) 11/24/2020   Hypotension 11/24/2020   NSTEMI (non-ST elevated myocardial infarction) ([place]) 11/24/2020   Abnormal gait 08/19/2020   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[place]) 08/19/2020   Anxiety state 08/19/2020   Callosity 08/19/2020   ED (erectile dysfunction) of organic origin 08/19/2020   Heart failure ([place]) 08/19/2020   Long term (current) use of insulin ([place]) 08/19/2020   Myalgia 08/19/2020   Obstructive uropathy 08/19/2020   Reactive depression 08/19/2020   CAD/ H/o prior STEMI 02/2020 s/p DES to LAD 08/19/2020   ESRD (end stage renal disease) ([place]) 08/19/2020   Blindness of left eye 08/15/2020   Combined forms of age-related cataract of right eye 04/17/2020   Supraventricular tachycardia 02/26/2020   Acute anterior wall MI ([place])    Benign prostatic hyperplasia with lower urinary tract symptoms 11/07/2019   Fluid overload, unspecified 08/15/2019   Hypertension secondary to other renal disorders 07/20/2019   NVG (neovascular glaucoma), left, 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   Anemia 06/05/2019   Aftercare including intermittent dialysis ([place])  05/15/2019   Moderate protein-calorie malnutrition ([place]) 05/14/2019   Anaphylactic reaction due to adverse effect of correct drug or medicament properly administered, initial encounter 05/08/2019   Chronic anemia 05/07/2019   Chest  pain, unspecified 05/07/2019   Coagulation defect, unspecified ([place]) 05/07/2019   Disorder of phosphorus metabolism, unspecified 05/07/2019   Encounter for fitting and adjustment of extracorporeal dialysis catheter ([place]) 05/07/2019   Iron deficiency anemia, unspecified 05/07/2019   Pain, unspecified 05/07/2019   Pruritus, unspecified 05/07/2019   Dizziness 41/28/7867   Metabolic acidosis 67/20/9470   Syncope 04/06/2019   Urinary retention 04/06/2019   Hypertensive chronic kidney disease with stage 1 through stage 4 chronic kidney disease, or unspecified chronic kidney disease 11/28/2018   Edema 11/28/2018   Hypokalemia 11/28/2018   Hypo-osmolality and hyponatremia 11/28/2018   Proteinuria 11/28/2018   Secondary hyperparathyroidism of renal origin ([place]) 11/28/2018   Lymphedema 01/09/2018   Chronic Grade 2 diastolic heart failure/EF 55 to 60% 12/13/2017   Vomiting 11/28/2017   Acute on chronic heart failure ([place]) 11/23/2017   Diabetic retinopathy ([place]) 07/05/2017   Uncontrolled type 2 diabetes mellitus with hyperglycemia, with long-term current use of insulin ([place]) 04/20/2016   Osteomyelitis of toe of left foot ([place]) 03/15/2016   Acute renal failure with tubular necrosis ([place]) 06/23/2015   Diarrhea 06/23/2015   Mixed hyperlipidemia    Essential hypertension, benign    Heart palpitations 05/06/2015   Sepsis ([place]) 03/07/2015    Orientation RESPIRATION BLADDER Height & Weight     Self, Time, Situation, Place  Normal Continent Weight: 177 lb 7.5 oz (80.5 kg) Height:  6\' 3"  (190.5 cm)  BEHAVIORAL SYMPTOMS/MOOD NEUROLOGICAL BOWEL NUTRITION STATUS      Continent Diet (See dc summary)  AMBULATORY STATUS COMMUNICATION OF NEEDS Skin   Extensive Assist Verbally Normal                       Personal Care Assistance Level of Assistance  Bathing, Feeding, Dressing Bathing Assistance: Maximum assistance Feeding assistance: Independent Dressing Assistance: Maximum assistance      Functional Limitations Info  Sight Sight Info: Impaired        SPECIAL CARE FACTORS FREQUENCY  PT (By licensed PT), OT (By licensed OT)     PT Frequency: 5x/week OT Frequency: 5x/week            Contractures Contractures Info: Not present    Additional Factors Info  Code Status, Allergies Code Status Info: Full Allergies Info: Promethazine, Promethazine Hcl, Other, Malt           Current Medications (11/24/2021):  This is the current hospital active medication list Current Facility-Administered Medications  Medication Dose Route Frequency Provider Last Rate Last Admin   acetaminophen (TYLENOL) tablet 650 mg  650 mg Oral Q4H PRN Greta Doom, MD   650 mg at 11/24/21 9628   Or   acetaminophen (TYLENOL) 160 MG/5ML solution 650 mg  650 mg Per Tube Q4H PRN Greta Doom, MD       Or   acetaminophen (TYLENOL) suppository 650 mg  650 mg Rectal Q4H PRN Greta Doom, MD       alteplase (CATHFLO ACTIVASE) injection 2 mg  2 mg Intracatheter Once PRN Elmarie Shiley, MD       alteplase (CATHFLO ACTIVASE) injection 2 mg  2 mg Intracatheter Once PRN Elmarie Shiley, MD       amiodarone (PACERONE) tablet 200 mg  200 mg Oral Daily Greta Doom, MD  200 mg at 11/24/21 1331   amLODipine (NORVASC) tablet 10 mg  10 mg Oral Daily Rosalin Hawking, MD   10 mg at 11/24/21 1328   anticoagulant sodium citrate solution 5 mL  5 mL Intracatheter PRN Elmarie Shiley, MD       anticoagulant sodium citrate solution 5 mL  5 mL Intracatheter PRN Elmarie Shiley, MD   5 mL at 11/24/21 1211   calcium acetate (PHOSLO) capsule 1,334 mg  1,334 mg Oral TID WC Greta Doom, MD   1,334 mg at 11/24/21 1339   carvedilol (COREG) tablet 25 mg  25 mg Oral BID Greta Doom, MD   25 mg at 11/24/21 1328   Chlorhexidine Gluconate Cloth 2 % PADS 6 each  6 each Topical Daily Greta Doom, MD   6 each at 11/24/21 1000   clevidipine (CLEVIPREX) infusion 0.5 mg/mL  0-21 mg/hr  Intravenous Continuous Rosalin Hawking, MD 14 mL/hr at 11/24/21 1402 7 mg/hr at 11/24/21 1402   cloNIDine (CATAPRES) tablet 0.3 mg  0.3 mg Oral Q8H Rosalin Hawking, MD       Darbepoetin Alfa (ARANESP) injection 150 mcg  150 mcg Intravenous Q Jayme Cloud, MD   150 mcg at 11/24/21 1100   dorzolamide-timolol (COSOPT) 22.3-6.8 MG/ML ophthalmic solution 1 drop  1 drop Right Eye BID Greta Doom, MD   1 drop at 11/24/21 1333   hydrALAZINE (APRESOLINE) injection 10-20 mg  10-20 mg Intravenous Q4H PRN Rosalin Hawking, MD       labetalol (NORMODYNE) injection 10-20 mg  10-20 mg Intravenous Q10 min PRN Rosalin Hawking, MD       levETIRAcetam (KEPPRA) tablet 250 mg  250 mg Oral Q T,Th,Sa-HD Greta Doom, MD   250 mg at 11/24/21 1340   levETIRAcetam (KEPPRA) tablet 500 mg  500 mg Oral BID Greta Doom, MD   500 mg at 11/23/21 2110   pantoprazole (PROTONIX) EC tablet 40 mg  40 mg Oral Daily Rosalin Hawking, MD   40 mg at 11/24/21 1329   prednisoLONE acetate (PRED FORTE) 1 % ophthalmic suspension 1 drop  1 drop Left Eye Q4H while awake Greta Doom, MD   1 drop at 11/24/21 1334   senna-docusate (Senokot-S) tablet 1 tablet  1 tablet Oral BID Greta Doom, MD   1 tablet at 11/22/21 1106   tamsulosin (FLOMAX) capsule 0.4 mg  0.4 mg Oral Daily Greta Doom, MD   0.4 mg at 11/24/21 1329   vancomycin (VANCOREADY) IVPB 750 mg/150 mL  750 mg Intravenous Q T,Th,Sa-HD von Dohlen, Haley B, RPH 150 mL/hr at 11/24/21 1104 750 mg at 11/24/21 1104     Discharge Medications: Please see discharge summary for a list of discharge medications.  Relevant Imaging Results:  Relevant Lab Results:   Additional Information SSn: [id number]. Dialysis TTS at [place], LCSW

## 2021-11-24 NOTE — Progress Notes (Signed)
PT Cancellation Note  Patient Details Name: [name]. MRN: [id number] DOB: [birth year]   Cancelled Treatment:    Reason Eval/Treat Not Completed: Patient at procedure or test/unavailable; patient receiving dialysis in his room this am.  Will attempt later as schedule permits.    [name] 11/24/2021, 10:05 AM [name], PT Acute Rehabilitation Services Office:[phone] 11/24/2021

## 2021-11-24 NOTE — Progress Notes (Signed)
Physical Therapy Treatment Patient Details Name: [name]. MRN: [id number] DOB: 1971/02/16 Today's Date: 11/24/2021   History of Present Illness 52 yo male admitted 9/30 ICH 4th ventricle and lateral ventricles  PMH ESRD TTS, HTN, amputation L big toe, L eye absent, CVA afib on plavix and Eliquis Glaucoma, seizure PVD CHF    PT Comments    Patient progressing slowly as with some symptoms of pre-syncope sitting up on EOB today, but able to position in chair position in bed and RN setting up tray and pt more interested in eating.  Feel he will progress with continued skilled PT in the acute setting and will need follow up acute inpatient rehab at d/c.    Recommendations for follow up therapy are one component of a multi-disciplinary discharge planning process, led by the attending physician.  Recommendations may be updated based on patient status, additional functional criteria and insurance authorization.  Follow Up Recommendations  Acute inpatient rehab (3hours/day)     Assistance Recommended at Discharge Frequent or constant Supervision/Assistance  Patient can return home with the following Two people to help with walking and/or transfers;Two people to help with bathing/dressing/bathroom;Direct supervision/assist for medications management;Assist for transportation;Assistance with cooking/housework;Direct supervision/assist for financial management   Equipment Recommendations  Other (comment) (TBA)    Recommendations for Other Services       Precautions / Restrictions Precautions Precautions: Fall Precaution Comments: seizure, SBP 130-150; HD port R chest, pink bands bil UE due to fistula,     Mobility  Bed Mobility Overal bed mobility: Needs Assistance Bed Mobility: Rolling, Sidelying to Sit Rolling: Mod assist Sidelying to sit: Max assist   Sit to supine: Mod assist   General bed mobility comments: initially attempting to lift trunk pt c/o back pain so returned  to side and rolled more to side the lifting up to sit; pt with tremor and eyes rolling so returned to supine and after short delay BP taken was 299 systolic, but likely lower in sitting RN in to assist to reposition up in bed and placed in semi chair position to set up lunch tray    Transfers                        Ambulation/Gait                   Stairs             Wheelchair Mobility    Modified Rankin (Stroke Patients Only) Modified Rankin (Stroke Patients Only) Pre-Morbid Rankin Score: Moderate disability Modified Rankin: Severe disability     Balance Overall balance assessment: Needs assistance Sitting-balance support: Feet supported Sitting balance-Leahy Scale: Poor Sitting balance - Comments: still leaning R at EOB                                    Cognition Arousal/Alertness: Awake/alert Behavior During Therapy: WFL for tasks assessed/performed Overall Cognitive Status: Impaired/Different from baseline Area of Impairment: Attention, Following commands                   Current Attention Level: Sustained   Following Commands: Follows one step commands consistently, Follows one step commands with increased time                Exercises      General Comments        Pertinent Vitals/Pain Pain  Assessment Faces Pain Scale: Hurts even more Pain Location: back with mobility Pain Descriptors / Indicators: Grimacing, Moaning Pain Intervention(s): Monitored during session, Limited activity within patient's tolerance, Repositioned    Home Living                          Prior Function            PT Goals (current goals can now be found in the care plan section) Progress towards PT goals: Progressing toward goals    Frequency           PT Plan Current plan remains appropriate    Co-evaluation              AM-PAC PT "6 Clicks" Mobility   Outcome Measure  Help needed turning  from your back to your side while in a flat bed without using bedrails?: A Lot Help needed moving from lying on your back to sitting on the side of a flat bed without using bedrails?: Total Help needed moving to and from a bed to a chair (including a wheelchair)?: Total Help needed standing up from a chair using your arms (e.g., wheelchair or bedside chair)?: Total Help needed to walk in hospital room?: Total Help needed climbing 3-5 steps with a railing? : Total 6 Click Score: 7    End of Session   Activity Tolerance: Treatment limited secondary to medical complications (Comment) Patient left: in bed;with call bell/phone within reach;with nursing/sitter in room   PT Visit Diagnosis: Other abnormalities of gait and mobility (R26.89);Other symptoms and signs involving the nervous system (R29.898);Muscle weakness (generalized) (M62.81)     Time: 1440-1500 PT Time Calculation (min) (ACUTE ONLY): 20 min  Charges:  $Therapeutic Activity: 8-22 mins                     [name], PT Acute Rehabilitation Services Office:[phone] 11/24/2021    [name] 11/24/2021, 5:14 PM

## 2021-11-25 ENCOUNTER — Encounter (INDEPENDENT_AMBULATORY_CARE_PROVIDER_SITE_OTHER): Payer: Medicare HMO

## 2021-11-25 ENCOUNTER — Ambulatory Visit (INDEPENDENT_AMBULATORY_CARE_PROVIDER_SITE_OTHER): Payer: Medicare HMO | Admitting: Nurse Practitioner

## 2021-11-25 ENCOUNTER — Encounter (INDEPENDENT_AMBULATORY_CARE_PROVIDER_SITE_OTHER): Payer: Self-pay

## 2021-11-25 ENCOUNTER — Inpatient Hospital Stay (HOSPITAL_COMMUNITY): Payer: Medicare HMO

## 2021-11-25 DIAGNOSIS — R7881 Bacteremia: Secondary | ICD-10-CM | POA: Diagnosis not present

## 2021-11-25 DIAGNOSIS — I629 Nontraumatic intracranial hemorrhage, unspecified: Secondary | ICD-10-CM | POA: Diagnosis not present

## 2021-11-25 DIAGNOSIS — T827XXA Infection and inflammatory reaction due to other cardiac and vascular devices, implants and grafts, initial encounter: Secondary | ICD-10-CM | POA: Diagnosis not present

## 2021-11-25 DIAGNOSIS — N186 End stage renal disease: Secondary | ICD-10-CM | POA: Diagnosis not present

## 2021-11-25 DIAGNOSIS — I161 Hypertensive emergency: Secondary | ICD-10-CM | POA: Diagnosis not present

## 2021-11-25 DIAGNOSIS — I619 Nontraumatic intracerebral hemorrhage, unspecified: Secondary | ICD-10-CM | POA: Diagnosis not present

## 2021-11-25 LAB — BASIC METABOLIC PANEL
Anion gap: 19 — ABNORMAL HIGH (ref 5–15)
BUN: 31 mg/dL — ABNORMAL HIGH (ref 6–20)
CO2: 22 mmol/L (ref 22–32)
Calcium: 8.6 mg/dL — ABNORMAL LOW (ref 8.9–10.3)
Chloride: 94 mmol/L — ABNORMAL LOW (ref 98–111)
Creatinine, Ser: 8.71 mg/dL — ABNORMAL HIGH (ref 0.61–1.24)
GFR, Estimated: 7 mL/min — ABNORMAL LOW (ref >60)
Glucose, Bld: 90 mg/dL (ref 70–99)
Potassium: 3.8 mmol/L (ref 3.5–5.1)
Sodium: 135 mmol/L (ref 135–145)

## 2021-11-25 LAB — CBC
HCT: 29.6 % — ABNORMAL LOW (ref 39.0–52.0)
Hemoglobin: 9.2 g/dL — ABNORMAL LOW (ref 13.0–17.0)
MCH: 22.9 pg — ABNORMAL LOW (ref 26.0–34.0)
MCHC: 31.1 g/dL (ref 30.0–36.0)
MCV: 73.6 fL — ABNORMAL LOW (ref 80.0–100.0)
Platelets: 125 10*3/uL — ABNORMAL LOW (ref 150–400)
RBC: 4.02 MIL/uL — ABNORMAL LOW (ref 4.22–5.81)
RDW: 19.8 % — ABNORMAL HIGH (ref 11.5–15.5)
WBC: 10 10*3/uL (ref 4.0–10.5)
nRBC: 0 % (ref 0.0–0.2)

## 2021-11-25 LAB — CULTURE, BLOOD (ROUTINE X 2)

## 2021-11-25 LAB — HEPATITIS B SURFACE ANTIBODY, QUANTITATIVE: Hep B S AB Quant (Post): 150.5 m[IU]/mL (ref >9.9)

## 2021-11-25 MED ORDER — HYDRALAZINE HCL 50 MG PO TABS
100.0000 mg | ORAL_TABLET | Freq: Three times a day (TID) | ORAL | Status: DC
  Administered 2021-11-25 – 2021-11-26 (×4): 100 mg via ORAL
  Filled 2021-11-25 (×4): qty 2

## 2021-11-25 MED ORDER — METOCLOPRAMIDE HCL 5 MG/ML IJ SOLN
10.0000 mg | Freq: Three times a day (TID) | INTRAMUSCULAR | Status: DC | PRN
  Administered 2021-11-25 – 2021-11-30 (×3): 10 mg via INTRAVENOUS
  Filled 2021-11-25 (×3): qty 2

## 2021-11-25 NOTE — Progress Notes (Signed)
Inpatient Rehab Admissions Coordinator:    I spoke with pt.'s wife regarding potential CIR admit. She states that family members have agreed to come stay with pt. To provide 24/7 assist after CIR and that she prefers CIR over SNF. Pt. Not yet medically ready (still on cleviprex drip), but I will send case to insurance for auth once medically ready.   Clemens Catholic, [place], [place] Admissions Coordinator  [phone] ([place]) [phone] (office)

## 2021-11-25 NOTE — Plan of Care (Signed)
  Problem: Education: Goal: Knowledge of disease or condition will improve Outcome: Progressing Goal: Knowledge of secondary prevention will improve (SELECT ALL) Outcome: Progressing Goal: Knowledge of patient specific risk factors will improve (INDIVIDUALIZE FOR PATIENT) Outcome: Progressing Goal: Individualized Educational Video(s) Outcome: Progressing   

## 2021-11-25 NOTE — Progress Notes (Signed)
Patient refused CPAP HS tonight. Patient in  no distress at this time.

## 2021-11-25 NOTE — Progress Notes (Signed)
Patient ID: [name]., male   DOB: [birth year], 51 y.o.   MRN: [id number] [facility] Progress Note   Assessment/ Plan:   1.  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  Not on antithrombotic therapy with ICH. 2. ESRD: I will order for hemodialysis tomorrow to continue his outpatient TTS schedule via right IJ TDC.  His left brachiocephalic fistula appears to be poorly developed and based on exam, possibly with inflow stenosis versus a collateral vein that will be addressed las an OP. 3. Anemia: Hgb currently lower without any overt losses, begin ESA.   4. CKD-MBD: Calcium level acceptable, will monitor phosphorus trends. 5. Nutrition: Continue current renal diet/fluid restriction with monitoring albumin levels to decide on need for nutritional supplementation. 6. Hypertension: Blood pressure appears to be currently under good control with ongoing amlodipine, carvedilol and clevidipine.  Subjective:   No acute complaints overnight after hemodialysis yesterday.   Objective:   BP (!) 142/81   Pulse 70   Temp (!) 100.4 F (38 C) (Axillary)   Resp 11   Ht 6\' 3"  (1.905 m)   Wt 80.5 kg   SpO2 98%   BMI 22.18 kg/m   Physical Exam: Gen: Resting comfortably in bed, arouses with some difficulty CVS: Pulse regular rhythm, normal rate, S1 and S2 normal Resp: Clear to auscultation bilaterally, no distinct rales or rhonchi.  Right IJ TDC Abd: Soft, flat, nontender, bowel sounds normal Ext: No lower extremity edema.  Status post left foot hallux amputation.  Left brachiocephalic fistula with poor augmentation and thrill in outflow.  Labs: BMET Recent Labs  Lab 11/21/21 1117 11/22/21 0054 11/24/21 0758 11/25/21 0759  NA 139 137 136 135  K 3.4* 4.0 4.5 3.8  CL 103 102 97* 94*  CO2 25 20* 19* 22  GLUCOSE 88 76 71 90  BUN 28* 32* 61* 31*  CREATININE  7.69* 9.54* 14.22* 8.71*  CALCIUM 8.4* 8.7* 8.3* 8.6*  PHOS  --   --  9.0*  --    CBC Recent Labs  Lab 11/21/21 1105 11/22/21 0054 11/24/21 0758 11/25/21 0759  WBC 6.6 7.2 9.7 10.0  HGB 9.8* 10.0* 8.4* 9.2*  HCT 32.6* 34.2* 28.4* 29.6*  MCV 74.3* 76.9* 76.1* 73.6*  PLT 140* 120* 119* 125*     Medications:     amiodarone  200 mg Oral Daily   amLODipine  10 mg Oral Daily   calcium acetate  1,334 mg Oral TID WC   carvedilol  25 mg Oral BID   Chlorhexidine Gluconate Cloth  6 each Topical Daily   cloNIDine  0.3 mg Oral Q8H   darbepoetin (ARANESP) injection - DIALYSIS  150 mcg Intravenous Q Tue-HD   dorzolamide-timolol  1 drop Right Eye BID   hydrALAZINE  100 mg Oral Q8H   levETIRAcetam  250 mg Oral Q T,Th,Sa-HD   levETIRAcetam  500 mg Oral BID   pantoprazole  40 mg Oral Daily   prednisoLONE acetate  1 drop Left Eye Q4H while awake   senna-docusate  1 tablet Oral BID   tamsulosin  0.4 mg Oral Daily   Elmarie Shiley, MD 11/25/2021, 10:18 AM

## 2021-11-25 NOTE — Progress Notes (Signed)
[facility] for Infectious Disease  Date of Admission:  11/21/2021           Reason for visit: Follow up on positive blood culture with Staph epidermidis  Current antibiotics: Vancomycin  ASSESSMENT:    51 y.o. male admitted with:  #Staph epidermidis positive blood culture #Fever #Intraventricular hemorrhage #End-stage renal disease #History of C. difficile March 2023  Patient presented with intraventricular hemorrhage likely related to uncontrolled hypertension.  He also had a few days of malaise, nausea, vomiting.  After being found febrile on admission blood cultures were obtained with 1 set of cultures.  1 out of 2 bottles yielded Staph epidermidis.  He had repeat blood cultures x2 obtained prior to getting any antibiotics.  These are currently no growth at 48 hours.  Patient also has a history of C. difficile colitis in March 2023 that has been treated.  No diarrhea at this time.  RECOMMENDATIONS:    Given overall suspicion that patient's blood culture is a contaminant, will stop vancomycin at this time.  He only has 1 out of 6 blood culture bottles that grew Staph epidermidis so I think this is likely a contaminant.  We will continue to monitor his cultures for any further growth. No further precautions needed for C. difficile as he completed treatment in March 2023 His history of C. difficile further sways the decision to limit antibiotic use Will follow his repeat blood cultures to ensure they remain negative   Principal Problem:   Bacteremia associated with intravascular line ([place]) Active Problems:   Intracranial bleed ([place])   ICH (intracerebral hemorrhage) ([place])   Hypertensive emergency    MEDICATIONS:    Scheduled Meds:  amiodarone  200 mg Oral Daily   amLODipine  10 mg Oral Daily   calcium acetate  1,334 mg Oral TID WC   carvedilol  25 mg Oral BID   Chlorhexidine Gluconate Cloth  6 each Topical Daily   cloNIDine  0.3 mg Oral Q8H   darbepoetin  (ARANESP) injection - DIALYSIS  150 mcg Intravenous Q Tue-HD   dorzolamide-timolol  1 drop Right Eye BID   hydrALAZINE  100 mg Oral Q8H   levETIRAcetam  250 mg Oral Q T,Th,Sa-HD   levETIRAcetam  500 mg Oral BID   pantoprazole  40 mg Oral Daily   prednisoLONE acetate  1 drop Left Eye Q4H while awake   senna-docusate  1 tablet Oral BID   tamsulosin  0.4 mg Oral Daily   Continuous Infusions:  anticoagulant sodium citrate     anticoagulant sodium citrate     clevidipine 14 mg/hr (11/25/21 0700)   vancomycin 150 mL/hr at 11/25/21 0700   PRN Meds:.acetaminophen **OR** acetaminophen (TYLENOL) oral liquid 160 mg/5 mL **OR** acetaminophen, alteplase, alteplase, anticoagulant sodium citrate, anticoagulant sodium citrate, hydrALAZINE, labetalol  SUBJECTIVE:   24 hour events:  RN overnight with some concern regarding isolation precautions given patient history of C. difficile in March 2023 No diarrhea complaints noted Improved fever curve overall, Tmax 100.4 at midnight Remains on vancomycin but is being dosed with HD At this time, patient has 1 out of 6 blood culture bottles drawn prior to antibiotics positive for methicillin-resistant Staph epidermidis No other events are noted acutely Therapy services are recommending acute inpatient rehab at discharge  Patient has no significant new complaints.  Reports somewhat of a cough still with discomfort across his midsection when he coughs.  Does not endorse fevers.  Review of Systems  All other  systems reviewed and are negative.     OBJECTIVE:   Blood pressure (!) 147/69, pulse 68, temperature 99.1 F (37.3 C), temperature source Oral, resp. rate (!) 0, height 6\' 3"  (1.905 m), weight 80.5 kg, SpO2 97 %. Body mass index is 22.18 kg/m.  Physical Exam Constitutional:      Appearance: Normal appearance.  HENT:     Head: Normocephalic and atraumatic.  Cardiovascular:     Comments: Right sided tunneled TDC in place.  No  tenderness. Abdominal:     General: There is no distension.     Palpations: Abdomen is soft.  Musculoskeletal:        General: Normal range of motion.  Skin:    General: Skin is warm and dry.  Neurological:     General: No focal deficit present.     Mental Status: He is alert and oriented to person, place, and time.  Psychiatric:        Mood and Affect: Mood normal.        Behavior: Behavior normal.      Lab Results: Lab Results  Component Value Date   WBC 9.7 11/24/2021   HGB 8.4 (L) 11/24/2021   HCT 28.4 (L) 11/24/2021   MCV 76.1 (L) 11/24/2021   PLT 119 (L) 11/24/2021    Lab Results  Component Value Date   NA 136 11/24/2021   K 4.5 11/24/2021   CO2 19 (L) 11/24/2021   GLUCOSE 71 11/24/2021   BUN 61 (H) 11/24/2021   CREATININE 14.22 (H) 11/24/2021   CALCIUM 8.3 (L) 11/24/2021   GFRNONAA 4 (L) 11/24/2021   GFRAA 5 (L) 08/27/2019    Lab Results  Component Value Date   ALT 8 11/21/2021   AST 12 (L) 11/21/2021   ALKPHOS 110 11/21/2021   BILITOT 1.2 11/21/2021       Component Value Date/Time   CRP 3.6 (H) 02/05/2021 0143       Component Value Date/Time   ESRSEDRATE 97 (H) 02/05/2021 0143     I have reviewed the micro and lab results in Epic.  Imaging: CT HEAD WO CONTRAST (5MM)  Result Date: 11/25/2021 CLINICAL DATA:  Hemorrhagic stroke follow-up EXAM: CT HEAD WITHOUT CONTRAST TECHNIQUE: Contiguous axial images were obtained from the base of the skull through the vertex without intravenous contrast. RADIATION DOSE REDUCTION: This exam was performed according to the departmental dose-optimization program which includes automated exposure control, adjustment of the mA and/or kV according to patient size and/or use of iterative reconstruction technique. COMPARISON:  Three days ago FINDINGS: Brain: Blood clot in the lower fourth ventricle extending through the foramen of [place]. Some redistribution into the occipital horns of the lateral ventricles by CT but  stable from MRI comparison 4 days ago. Stable to slightly decreased ventricular volume. No evidence of interval hemorrhage or infarct. Vascular: Extensive arterial calcification. Skull: Normal. Negative for fracture or focal lesion. Sinuses/Orbits: No acute finding. IMPRESSION: Unchanged intraventricular clot with stable or slightly decreased ventricular volume. No new or progressive finding. Electronically Signed   By: Jorje Guild M.D.   On: 11/25/2021 05:26     Imaging independently reviewed in Epic.    [facility] for Infectious Disease [facility] [phone] pager 11/25/2021, 8:08 AM

## 2021-11-25 NOTE — Progress Notes (Signed)
STROKE TEAM PROGRESS NOTE   INTERVAL HISTORY RN is at the bedside for HD. Patient reclining in bed, AO x3, just finished talking with family over the phone, calm and good spirit today. Not in good appetite, largely due to persistent hiccup overnight and this am. BP still on the high end, but most his BP measure was from right lower leg. When check with right forearm, the BP was lower. However, pt had old right AVF before for HD, not good place for BP checking either.   Vitals:   11/25/21 0800 11/25/21 0815 11/25/21 0830 11/25/21 0846  BP: 137/75 (!) 146/72 (!) 141/78 (!) 142/81  Pulse: 69 69 70   Resp: (!) 0 10 11   Temp: (!) 100.4 F (38 C)     TempSrc: Axillary     SpO2: 97% 96% 98%   Weight:      Height:       CBC:  Recent Labs  Lab 11/24/21 0758 11/25/21 0759  WBC 9.7 10.0  HGB 8.4* 9.2*  HCT 28.4* 29.6*  MCV 76.1* 73.6*  PLT 119* 376*   Basic Metabolic Panel:  Recent Labs  Lab 11/21/21 1117 11/22/21 0054 11/24/21 0758 11/25/21 0759  NA 139   < > 136 135  K 3.4*   < > 4.5 3.8  CL 103   < > 97* 94*  CO2 25   < > 19* 22  GLUCOSE 88   < > 71 90  BUN 28*   < > 61* 31*  CREATININE 7.69*   < > 14.22* 8.71*  CALCIUM 8.4*   < > 8.3* 8.6*  MG 1.9  --   --   --   PHOS  --   --  9.0*  --    < > = values in this interval not displayed.   Lipid Panel:  Recent Labs  Lab 11/21/21 1117  CHOL 94  TRIG 106  HDL 39*  CHOLHDL 2.4  VLDL 21  LDLCALC 34   HgbA1c:  Recent Labs  Lab 11/21/21 1105  HGBA1C 4.5*   Urine Drug Screen: No results for input(s): "LABOPIA", "COCAINSCRNUR", "LABBENZ", "AMPHETMU", "THCU", "LABBARB" in the last 168 hours.  Alcohol Level No results for input(s): "ETH" in the last 168 hours.  IMAGING past 24 hours CT HEAD WO CONTRAST (5MM)  Result Date: 11/25/2021 CLINICAL DATA:  Hemorrhagic stroke follow-up EXAM: CT HEAD WITHOUT CONTRAST TECHNIQUE: Contiguous axial images were obtained from the base of the skull through the vertex without  intravenous contrast. RADIATION DOSE REDUCTION: This exam was performed according to the departmental dose-optimization program which includes automated exposure control, adjustment of the mA and/or kV according to patient size and/or use of iterative reconstruction technique. COMPARISON:  Three days ago FINDINGS: Brain: Blood clot in the lower fourth ventricle extending through the foramen of [place]. Some redistribution into the occipital horns of the lateral ventricles by CT but stable from MRI comparison 4 days ago. Stable to slightly decreased ventricular volume. No evidence of interval hemorrhage or infarct. Vascular: Extensive arterial calcification. Skull: Normal. Negative for fracture or focal lesion. Sinuses/Orbits: No acute finding. IMPRESSION: Unchanged intraventricular clot with stable or slightly decreased ventricular volume. No new or progressive finding. Electronically Signed   By: Jorje Guild M.D.   On: 11/25/2021 05:26    PHYSICAL EXAM General:  Drowsy, well-nourished, well-developed patient s/p removal of left eye in no acute distress Respiratory:  Regular, unlabored respirations  NEURO:  Patient AAO x 3, better spirit  today after talking with family over the phone, awake alert, orientated to age and place.  No aphasia, no significant dysarthria, follow simple commands.  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  Bilateral upper extremity 4/5, bilateral lower extremity 3/5.  Sensation symmetrical subjectively and finger-to-nose grossly intact.  Gait not tested   ASSESSMENT/PLAN [name]. is a 51 y.o. 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  He presented to the ED and was found to be hypertensive with SBP in the 200s.  Head CT revealed ICH in the fourth ventricle.   Eliquis was reversed with Andexxa. It is uncertain when this occurred as patient's symptoms had been going on for several days.  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 The pericardial effusion is circumferential. There is no evidence of cardiac tamponade LDL 34 HgbA1c 4.5 VTE prophylaxis - SCDs clopidogrel 75 mg daily and Eliquis (apixaban) daily prior to admission, now on No antithrombotic secondary to IVH Therapy recommendations:  CIR vs. SNF Disposition:  pending  Hypertensive emergency Home meds:  carvedilol 25 mg BID, amlodipine 5 mg daily, clonidine  0.1 mg TID PRN Unstable, requiring Cleviprex Now on amlodipine 10, Coreg 25 twice daily, clonidine 0.1->0.2->0.3 to 8 hours Add hydralazine 100 Q8h Keep SBP <160 Long-term BP goal normotensive  History of stroke and seizure 12/2020 admitted for syncope and unresponsiveness, CT negative 01/2021 admitted for transient slurred speech.  MRI showed left hippocampus punctate infarct.  MRA and carotid Doppler unremarkable, EEG no seizure.  LDL 38, A1c 5.7, EF 55 to 60%.  Discharged on Eliquis, Plavix and Lipitor 40 03/2021 admitted for left upper extremity weakness, CT negative.  CTA head and neck poor visualization but no significant finding.  MRI showed bilateral MCA/ACA watershed infarcts as well as left splenium small infarct.  EEG no seizure 06/2021 admitted for jerking movement for weeks.  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  MRI no acute infarct 07/2021  admitted for seizure-like activity in the setting of recent dialysis and glucose 77.  Long-term EEG monitoring no seizure.  Put on Keppra and discontinue Klonopin.  Fevers Persistent fevers Tmax 102->101.7->100.4 1 of 2 blood cultures growing MRSE, typically a contaminant, however given risk (HD catheter) of real infection and continue fevers, now on Vanc.  Repeat blood culture so far NGTD ID consulted  Hyperlipidemia Home meds: atorvastatin 80 mg daily LDL 34, goal < 70 Hold off statin for now due to acute IVH Consider to resume statin at discharge  ESRD on HD Patient receives dialysis T, Th, Sat Appreciate nephrology input Continue dialysis on outpatient schedule  Large pericardial effusion Found on both CT chest and 2D echo Likely due to ESRD No tamponade on 2D echo Hemodynamically stable Close monitoring  Hiccup Persistent hiccup overnight Not able to give thorazine due to QT prolongation Not able to  give baclofen due to ESRD Now on reglan Q8h PRN monitoring  Other Stroke Risk Factors PVD dCHF  Other Active Problems Nodules in left lung Chest CT shows ground glass nodules in bilateral lungs Recommend follow up chest CT every 6-12 months to monitor nodules  Hospital day # 4   Rosalin Hawking, MD PhD Stroke Neurology 11/25/2021 10:03 AM  This patient is critically ill due to [facility], hypertensive emergency, history of seizure and at significant risk of neurological worsening, death form recurrent IVH, hydrocephalus, hypertensive encephalopathy, status epilepticus. This patient's care requires constant monitoring of vital signs, hemodynamics, respiratory and cardiac monitoring, review of multiple databases, neurological assessment, discussion with family, other specialists and medical decision making of high complexity. I spent 35 minutes of neurocritical care time in the care of this patient.

## 2021-11-25 NOTE — Progress Notes (Signed)
Pt clearly w/ positive C. Diff 04/26/2021 but no order for Isolation. This RN w/ concern of infection control. Dr. Leonel Ramsay informed after RN not finding any mention of this most recent diagnosis in any notes prior to this note. No response back as of yet as what we should do about pt's isolation status, if any at all.

## 2021-11-25 NOTE — Progress Notes (Signed)
Physical Therapy Treatment Patient Details Name: [name]. MRN: [id number] DOB: Mar 31, 1970 Today's Date: 11/25/2021   History of Present Illness 51 yo male admitted 9/30 ICH 4th ventricle and lateral ventricles  PMH ESRD TTS, HTN, amputation L big toe, L eye absent, CVA afib on plavix and Eliquis Glaucoma, seizure PVD CHF    PT Comments    Progressing to OOB this session but very weak and fatigued quickly with tight BP control and SBP 103 after seated in recliner.  Feel his normal high blood pressure is making him feel weak with tight control.  Patient incontinent of stool as well in standing so assisted with hygiene.  PT will continue to follow.  Remains appropriate for acute inpatient rehab at d/c.    Recommendations for follow up therapy are one component of a multi-disciplinary discharge planning process, led by the attending physician.  Recommendations may be updated based on patient status, additional functional criteria and insurance authorization.  Follow Up Recommendations  Acute inpatient rehab (3hours/day)     Assistance Recommended at Discharge Frequent or constant Supervision/Assistance  Patient can return home with the following Two people to help with walking and/or transfers;Direct supervision/assist for medications management;Assist for transportation;Assistance with cooking/housework;Direct supervision/assist for financial management;A lot of help with bathing/dressing/bathroom   Equipment Recommendations  Other (comment) (TBA)    Recommendations for Other Services       Precautions / Restrictions Precautions Precautions: Fall Precaution Comments: seizure, SBP 130-150; HD port R chest     Mobility  Bed Mobility Overal bed mobility: Needs Assistance Bed Mobility: Supine to Sit   Sidelying to sit: Min assist, HOB elevated       General bed mobility comments: assist for scooting hips and lifting trunk with HOB up    Transfers Overall transfer  level: Needs assistance Equipment used: Rolling walker (2 wheels) Transfers: Sit to/from Stand, Bed to chair/wheelchair/BSC Sit to Stand: Mod assist     Squat pivot transfers: Mod assist     General transfer comment: up to stand with RW x 2 due to BM and assist for hygiene, to chair via squat pivot with mod A and mod cues    Ambulation/Gait                   Stairs             Wheelchair Mobility    Modified Rankin (Stroke Patients Only) Modified Rankin (Stroke Patients Only) Pre-Morbid Rankin Score: Moderate disability Modified Rankin: Severe disability     Balance Overall balance assessment: Needs assistance   Sitting balance-Leahy Scale: Poor Sitting balance - Comments: upright in midline but leaning forward onto walker due to feeling weakn   Standing balance support: During functional activity, Bilateral upper extremity supported Standing balance-Leahy Scale: Poor Standing balance comment: UE support and min to mod A while assist for hygiene                            Cognition Arousal/Alertness: Awake/alert Behavior During Therapy: Flat affect Overall Cognitive Status: Impaired/Different from baseline Area of Impairment: Attention, Following commands, Memory                   Current Attention Level: Sustained Memory: Decreased recall of precautions, Decreased short-term memory Following Commands: Follows one step commands consistently, Follows one step commands with increased time       General Comments: slow to respond with lethargy, increased time to  follow commands        Exercises Other Exercises Other Exercises: heel slides x 5 AAROM    General Comments General comments (skin integrity, edema, etc.): BP in sitting after standing tasks 850 systolic, back to 277 next measurement      Pertinent Vitals/Pain Pain Assessment Pain Assessment: Faces Faces Pain Scale: Hurts little more Pain Location: back with  mobility Pain Descriptors / Indicators: Discomfort, Grimacing Pain Intervention(s): Monitored during session, Repositioned    Home Living                          Prior Function            PT Goals (current goals can now be found in the care plan section) Progress towards PT goals: Progressing toward goals    Frequency    Min 4X/week      PT Plan Current plan remains appropriate    Co-evaluation              AM-PAC PT "6 Clicks" Mobility   Outcome Measure  Help needed turning from your back to your side while in a flat bed without using bedrails?: A Lot Help needed moving from lying on your back to sitting on the side of a flat bed without using bedrails?: A Lot Help needed moving to and from a bed to a chair (including a wheelchair)?: A Lot Help needed standing up from a chair using your arms (e.g., wheelchair or bedside chair)?: A Lot Help needed to walk in hospital room?: Total Help needed climbing 3-5 steps with a railing? : Total 6 Click Score: 10    End of Session Equipment Utilized During Treatment: Gait belt Activity Tolerance: Patient limited by fatigue Patient left: in chair;with call bell/phone within reach;with chair alarm set Nurse Communication: Mobility status PT Visit Diagnosis: Other abnormalities of gait and mobility (R26.89);Other symptoms and signs involving the nervous system (R29.898);Muscle weakness (generalized) (M62.81)     Time: 1028-1100 PT Time Calculation (min) (ACUTE ONLY): 32 min  Charges:  $Therapeutic Activity: 23-37 mins                     [name], PT Acute Rehabilitation Services Office:[phone] 11/25/2021    [name] 11/25/2021, 11:22 AM

## 2021-11-26 DIAGNOSIS — I629 Nontraumatic intracranial hemorrhage, unspecified: Secondary | ICD-10-CM | POA: Diagnosis not present

## 2021-11-26 DIAGNOSIS — I615 Nontraumatic intracerebral hemorrhage, intraventricular: Secondary | ICD-10-CM | POA: Diagnosis not present

## 2021-11-26 LAB — CBC
HCT: 30.4 % — ABNORMAL LOW (ref 39.0–52.0)
Hemoglobin: 9.1 g/dL — ABNORMAL LOW (ref 13.0–17.0)
MCH: 22 pg — ABNORMAL LOW (ref 26.0–34.0)
MCHC: 29.9 g/dL — ABNORMAL LOW (ref 30.0–36.0)
MCV: 73.4 fL — ABNORMAL LOW (ref 80.0–100.0)
Platelets: 129 10*3/uL — ABNORMAL LOW (ref 150–400)
RBC: 4.14 MIL/uL — ABNORMAL LOW (ref 4.22–5.81)
RDW: 20.2 % — ABNORMAL HIGH (ref 11.5–15.5)
WBC: 9.4 10*3/uL (ref 4.0–10.5)
nRBC: 0 % (ref 0.0–0.2)

## 2021-11-26 LAB — BASIC METABOLIC PANEL
Anion gap: 17 — ABNORMAL HIGH (ref 5–15)
BUN: 46 mg/dL — ABNORMAL HIGH (ref 6–20)
CO2: 19 mmol/L — ABNORMAL LOW (ref 22–32)
Calcium: 7.8 mg/dL — ABNORMAL LOW (ref 8.9–10.3)
Chloride: 96 mmol/L — ABNORMAL LOW (ref 98–111)
Creatinine, Ser: 10.44 mg/dL — ABNORMAL HIGH (ref 0.61–1.24)
GFR, Estimated: 5 mL/min — ABNORMAL LOW (ref >60)
Glucose, Bld: 99 mg/dL (ref 70–99)
Potassium: 4 mmol/L (ref 3.5–5.1)
Sodium: 132 mmol/L — ABNORMAL LOW (ref 135–145)

## 2021-11-26 LAB — TRIGLYCERIDES: Triglycerides: 124 mg/dL (ref <150)

## 2021-11-26 MED ORDER — HEPARIN SODIUM (PORCINE) 5000 UNIT/ML IJ SOLN
5000.0000 [IU] | Freq: Three times a day (TID) | INTRAMUSCULAR | Status: DC
  Administered 2021-11-26 – 2021-12-05 (×26): 5000 [IU] via SUBCUTANEOUS
  Filled 2021-11-26 (×27): qty 1

## 2021-11-26 MED ORDER — PNEUMOCOCCAL 20-VAL CONJ VACC 0.5 ML IM SUSY
0.5000 mL | PREFILLED_SYRINGE | INTRAMUSCULAR | Status: DC
  Filled 2021-11-26: qty 0.5

## 2021-11-26 NOTE — Progress Notes (Signed)
IP rehab admissions - noted patient with decreased responsiveness and not participating fully with therapies at this time.  I will hold off on opening the case with [facility] until patient is participating fully.  I will have my partner follow up tomorrow.  Call me for questions.  [phone]

## 2021-11-26 NOTE — Progress Notes (Signed)
Patient ID: [name]., male   DOB: 01/18/1971, 51 y.o.   MRN: [id number] [facility] Progress Note   Assessment/ Plan:   1.  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  Not on antithrombotic therapy with ICH. 2. ESRD: He is on schedule for hemodialysis today to continue his outpatient TTS schedule via right IJ TDC.  His left brachiocephalic fistula appears to be poorly developed and based on exam, possibly with inflow stenosis versus a collateral vein that will be addressed las an OP. 3. Anemia: Hgb currently lower without any overt losses, begin ESA.   4. CKD-MBD: Calcium level acceptable, will monitor phosphorus trends. 5. Nutrition: Continue current renal diet/fluid restriction with monitoring albumin levels to decide on need for nutritional supplementation. 6. Hypertension: Blood pressure appears to be currently under good control with ongoing amlodipine, carvedilol and clevidipine.  Subjective:   Expresses concern about timing of dialysis; anticipates family to come in from [place] to visit with him.  Noted to be febrile this morning.   Objective:   BP 99/82   Pulse 71   Temp (!) 100.8 F (38.2 C) (Axillary)   Resp 18   Ht 6\' 3"  (1.905 m)   Wt 80.5 kg   SpO2 96%   BMI 22.18 kg/m   Physical Exam: Gen: Appears comfortable resting in bed CVS: Pulse regular rhythm, normal rate, S1 and S2 normal Resp: Clear to auscultation bilaterally, no distinct rales or rhonchi.  Right IJ TDC Abd: Soft, flat, nontender, bowel sounds normal Ext: No lower extremity edema.  Status post left foot hallux amputation.  Left brachiocephalic fistula with poor augmentation and thrill in outflow.  Labs: BMET Recent Labs  Lab 11/21/21 1117 11/22/21 0054 11/24/21 0758 11/25/21 0759 11/26/21 0327  NA 139 137 136 135 132*  K 3.4* 4.0 4.5 3.8 4.0  CL 103 102  97* 94* 96*  CO2 25 20* 19* 22 19*  GLUCOSE 88 76 71 90 99  BUN 28* 32* 61* 31* 46*  CREATININE 7.69* 9.54* 14.22* 8.71* 10.44*  CALCIUM 8.4* 8.7* 8.3* 8.6* 7.8*  PHOS  --   --  9.0*  --   --    CBC Recent Labs  Lab 11/22/21 0054 11/24/21 0758 11/25/21 0759 11/26/21 0327  WBC 7.2 9.7 10.0 9.4  HGB 10.0* 8.4* 9.2* 9.1*  HCT 34.2* 28.4* 29.6* 30.4*  MCV 76.9* 76.1* 73.6* 73.4*  PLT 120* 119* 125* 129*     Medications:     amiodarone  200 mg Oral Daily   amLODipine  10 mg Oral Daily   calcium acetate  1,334 mg Oral TID WC   carvedilol  25 mg Oral BID   Chlorhexidine Gluconate Cloth  6 each Topical Daily   cloNIDine  0.3 mg Oral Q8H   darbepoetin (ARANESP) injection - DIALYSIS  150 mcg Intravenous Q Tue-HD   dorzolamide-timolol  1 drop Right Eye BID   heparin injection (subcutaneous)  5,000 Units Subcutaneous Q8H   hydrALAZINE  100 mg Oral Q8H   levETIRAcetam  250 mg Oral Q T,Th,Sa-HD   levETIRAcetam  500 mg Oral BID   pantoprazole  40 mg Oral Daily   prednisoLONE acetate  1 drop Left Eye Q4H while awake   senna-docusate  1 tablet Oral BID   tamsulosin  0.4 mg Oral Daily   Elmarie Shiley, MD 11/26/2021, 9:52 AM

## 2021-11-26 NOTE — Progress Notes (Addendum)
STROKE TEAM PROGRESS NOTE   INTERVAL HISTORY Transfer out today, hospitalist to pickup tomorrow. Lowering BP medications due to orthostatic hypotension when working with PT. Planning for dialysis today as well.   Vitals:   11/26/21 0501 11/26/21 0517 11/26/21 0600 11/26/21 0700  BP:  117/63 (!) 130/51 106/63  Pulse: 70 70 70 70  Resp: 20 18 18 13   Temp:      TempSrc:      SpO2:  96% 96% 96%  Weight:      Height:       CBC:  Recent Labs  Lab 11/25/21 0759 11/26/21 0327  WBC 10.0 9.4  HGB 9.2* 9.1*  HCT 29.6* 30.4*  MCV 73.6* 73.4*  PLT 125* 129*    Basic Metabolic Panel:  Recent Labs  Lab 11/21/21 1117 11/22/21 0054 11/24/21 0758 11/25/21 0759 11/26/21 0327  NA 139   < > 136 135 132*  K 3.4*   < > 4.5 3.8 4.0  CL 103   < > 97* 94* 96*  CO2 25   < > 19* 22 19*  GLUCOSE 88   < > 71 90 99  BUN 28*   < > 61* 31* 46*  CREATININE 7.69*   < > 14.22* 8.71* 10.44*  CALCIUM 8.4*   < > 8.3* 8.6* 7.8*  MG 1.9  --   --   --   --   PHOS  --   --  9.0*  --   --    < > = values in this interval not displayed.    Lipid Panel:  Recent Labs  Lab 11/21/21 1117 11/26/21 0327  CHOL 94  --   TRIG 106 124  HDL 39*  --   CHOLHDL 2.4  --   VLDL 21  --   LDLCALC 34  --     HgbA1c:  Recent Labs  Lab 11/21/21 1105  HGBA1C 4.5*    Urine Drug Screen: No results for input(s): "LABOPIA", "COCAINSCRNUR", "LABBENZ", "AMPHETMU", "THCU", "LABBARB" in the last 168 hours.  Alcohol Level No results for input(s): "ETH" in the last 168 hours.  IMAGING past 24 hours No results found.  PHYSICAL EXAM General:  Drowsy, well-nourished, well-developed patient s/p removal of left eye in no acute distress Respiratory:  Regular, unlabored respirations  NEURO:  Patient AAO x 3, tired today but awakens to voice, orientated to age and place.  No aphasia, no significant dysarthria, follow simple commands.  No gaze palsy, visual field full on the right eye, left eyeball s/p removal due to  glaucoma, facial symmetrical.  Bilateral upper extremity 4/5, bilateral lower extremity 3/5.  Sensation symmetrical subjectively and finger-to-nose grossly intact.  Gait not tested   ASSESSMENT/PLAN [name]. is a 51 y.o. 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  He presented to the ED and was found to be hypertensive with SBP in the 200s.  Head CT revealed ICH in the fourth ventricle.  Eliquis was reversed with Andexxa. Nephrology and infectious disease on board. Dialysis TTS. Cleviprex off, bp well controlled with PO medications today.   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 The pericardial effusion is circumferential. There is no evidence of cardiac tamponade LDL 34 HgbA1c 4.5 VTE prophylaxis - SCDs clopidogrel 75 mg daily and Eliquis (apixaban) daily prior to admission, now on No antithrombotic secondary to IVH Therapy recommendations:  CIR Disposition:  pending  Hypertensive emergency Home meds:  carvedilol 25 mg BID, amlodipine 5 mg daily, clonidine  0.1 mg TID PRN Unstable, requiring Cleviprex Now on amlodipine 10, Coreg 25 twice daily, clonidine 0.1->0.2->0.3 to 8 hours Was on hydralazine 100 Q8h -> orthostasis 10/5 with therapy ->d/c hydralazine Keep SBP <160 Long-term BP goal normotensive  History of stroke and seizure 12/2020 admitted for syncope and unresponsiveness, CT  negative 01/2021 admitted for transient slurred speech.  MRI showed left hippocampus punctate infarct.  MRA and carotid Doppler unremarkable, EEG no seizure.  LDL 38, A1c 5.7, EF 55 to 60%.  Discharged on Eliquis, Plavix and Lipitor 40 03/2021 admitted for left upper extremity weakness, CT negative.  CTA head and neck poor visualization but no significant finding.  MRI showed bilateral MCA/ACA watershed infarcts as well as left splenium small infarct.  EEG no seizure 06/2021 admitted for jerking movement for weeks.  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  MRI no acute infarct 07/2021 admitted for seizure-like activity in the setting of recent dialysis and glucose 77.  Long-term EEG monitoring no seizure.  Put on Keppra and discontinue Klonopin.  Fevers Persistent fevers Tmax 102->101.7->100.4->101.6 1 of 2 blood cultures growing MRSE, typically a contaminant, however given risk (HD catheter) of real infection and continue fevers, put on Vanc.  Repeat blood culture 4/4 so far NGTD ID consulted -> d/c vanco  Hyperlipidemia Home meds: atorvastatin 80 mg daily LDL 34, goal < 70 Hold off statin for now due to acute IVH Consider to resume statin at discharge  ESRD on HD Patient receives dialysis T, Th, Sat Appreciate nephrology input Continue dialysis on outpatient schedule  Large pericardial effusion Found on both CT chest and 2D echo Likely due to ESRD No tamponade on 2D echo Hemodynamically stable Close monitoring  Hiccup- Resolved Not able to give thorazine due to QT prolongation Not able to give baclofen due to ESRD Now on reglan Q8h PRN monitoring  Other Stroke Risk Factors PVD dCHF  Other Active Problems Nodules in left lung Chest CT shows ground glass nodules in bilateral lungs Recommend follow up chest CT every 6-12 months to monitor nodules  Hospital day # 5  Patient seen and examined by NP/APP with MD. MD to update note as needed.   Janine Ores, DNP, FNP-BC [facility] Pager: [phone]  ATTENDING NOTE: I reviewed above note and agree with the assessment and plan. Pt was seen and examined.   No family at the bedside. Neuro stable, unchanged, moving all extremities, AAOx3. Off cleviprex. Orthostasis reported by therapy this am, will d/c hydralazine. Continue other BP meds, BP monitoring. ID has d/c vanco given only 1/6 growing strep epi.   For detailed assessment and plan, please refer to above/below as I have made changes wherever appropriate.   Rosalin Hawking, MD PhD Stroke Neurology 11/26/2021 11:29 PM  This patient is critically ill due to IVH, hypertensive emergency, history of seizure and at significant risk of neurological worsening, death form recurrent IVH, hydrocephalus,  hypertensive encephalopathy, status epilepticus. This patient's care requires constant monitoring of vital signs, hemodynamics, respiratory and cardiac monitoring, review of multiple databases, neurological assessment, discussion with family, other specialists and medical decision making of high complexity. I spent 30 minutes of neurocritical care time in the care of this patient.

## 2021-11-26 NOTE — Progress Notes (Signed)
Occupational Therapy Treatment Patient Details Name: [name]. MRN: [id number] DOB: [birth year] Today's Date: 11/26/2021   History of present illness 51 yo male admitted 9/30 ICH 4th ventricle and lateral ventricles.  PMH includes: ESRD on HD TTS, HTN, amputation L big toe, L eye absent, CVA, afib on plavix and Eliquis, Glaucoma, seizure, PVD, and CHF.   OT comments  Able to progress to EOB with Mod A. Posterior bias when sitting, however able to maintain midline postural control with min A during ADL tasks EOB. Pt participating with OT/PT when he became unresponsive, falling toward L with RUE extended. PT/OT returned pt to supine with total A and nsg made aware. See PT note for BP. Pt became alert once he was returned to supine. Given prior level of function, recommend AIR to Maximize functional level of independence to facilitate safe DC home with assistance of family. Will continue to follow.    Recommendations for follow up therapy are one component of a multi-disciplinary discharge planning process, led by the attending physician.  Recommendations may be updated based on patient status, additional functional criteria and insurance authorization.    Follow Up Recommendations  Acute inpatient rehab (3hours/day)    Assistance Recommended at Discharge Frequent or constant Supervision/Assistance  Patient can return home with the following  Two people to help with walking and/or transfers;Two people to help with bathing/dressing/bathroom   Equipment Recommendations  BSC/3in1;Wheelchair (measurements OT);Wheelchair cushion (measurements OT)    Recommendations for Other Services      Precautions / Restrictions Precautions Precautions: Fall Precaution Comments: seizure, SBP 130-150; HD port R chest Restrictions Weight Bearing Restrictions: No       Mobility Bed Mobility Overal bed mobility: Needs Assistance Bed Mobility: Supine to Sit, Sit to Supine     Supine to sit: Min  assist, HOB elevated Sit to supine: Max assist, +2 for physical assistance   General bed mobility comments: minA with pt moving LE to EOB and then minA with sequential cues and assist at trunk to complete transition to sitting. pt with decreased responsiveness resulting in need for maxA of 2 to return to supine    Transfers                   General transfer comment: deferred due to decreased responsiveness     Balance Overall balance assessment: Needs assistance Sitting-balance support: Feet supported Sitting balance-Leahy Scale: Poor Sitting balance - Comments: initially minG with UE support, but progressively stronger L lateral lean needing up to mod-maxA as pt responsiveness decreased. Postural control: Posterior lean, Left lateral lean                                 ADL either performed or assessed with clinical judgement   ADL Overall ADL's : Needs assistance/impaired     Grooming: Minimal assistance   Upper Body Bathing: Minimal assistance   Lower Body Bathing: Moderate assistance Lower Body Bathing Details (indicate cue type and reason): pt washing groin area and upper legs               Toileting - Clothing Manipulation Details (indicate cue type and reason): incontinent of BM during sesison     Functional mobility during ADLs: Maximal assistance      Extremity/Trunk Assessment Upper Extremity Assessment Upper Extremity Assessment: RUE deficits/detail;LUE deficits/detail RUE Deficits / Details: generalized weakness; finger amputation LUE Deficits / Details: generalized weakness; limited shoulder  ROM at basleine form apparent RTC insufficiency LUE Coordination: decreased fine motor;decreased gross motor   Lower Extremity Assessment Lower Extremity Assessment: Defer to PT evaluation        Vision   Additional Comments: blind L eye; poor vision   Perception     Praxis      Cognition Arousal/Alertness: Awake/alert Behavior  During Therapy: Flat affect Overall Cognitive Status: Impaired/Different from baseline Area of Impairment: Attention, Following commands, Memory, Safety/judgement, Awareness, Problem solving                 Orientation Level: Disoriented to, Place, Time, Situation Current Attention Level: Sustained Memory: Decreased recall of precautions, Decreased short-term memory Following Commands: Follows one step commands inconsistently, Follows one step commands with increased time Safety/Judgement: Decreased awareness of safety, Decreased awareness of deficits Awareness: Intellectual, Emergent Problem Solving: Slow processing, Decreased initiation, Difficulty sequencing, Requires verbal cues General Comments: pt slow to respond with progressively slowed responses and at times needing repetition of cues to follow instructions. pt with episode of decreased responsiveness, LOB needing increased assist and pt making no attempts to correct        Exercises Other Exercises Other Exercises: pulling forwards on bed rails x3 with minimal movement of trunk    Shoulder Instructions       General Comments BP unable to be taken in sitting due to urgent return to supine. 112/82 (89) in supine after return to bed, 92/72 (78) sitting in bed in chair position with HOB at 55 deg    Pertinent Vitals/ Pain       Pain Assessment Pain Assessment: Faces Faces Pain Scale: Hurts a little bit Pain Location: back with position in bed Pain Descriptors / Indicators: Discomfort, Grimacing Pain Intervention(s): Limited activity within patient's tolerance  Home Living                                          Prior Functioning/Environment              Frequency  Min 2X/week        Progress Toward Goals  OT Goals(current goals can now be found in the care plan section)  Progress towards OT goals: Progressing toward goals  Acute Rehab OT Goals Patient Stated Goal: to wash his  armpits OT Goal Formulation: With patient Time For Goal Achievement: 12/07/21 Potential to Achieve Goals: Good ADL Goals Pt Will Perform Grooming: with min assist;sitting Pt Will Transfer to Toilet: with +2 assist;with mod assist;bedside commode;stand pivot transfer Additional ADL Goal #1: Pt will complete bed mobility mod (A) as precursor to adls. Additional ADL Goal #2: Pt will static sit EOB min (A) as precursor to adls  Plan Discharge plan remains appropriate    Co-evaluation    PT/OT/SLP Co-Evaluation/Treatment: Yes Reason for Co-Treatment: Complexity of the patient's impairments (multi-system involvement);For patient/therapist safety;To address functional/ADL transfers PT goals addressed during session: Mobility/safety with mobility;Balance;Strengthening/ROM        AM-PAC OT "6 Clicks" Daily Activity     Outcome Measure   Help from another person eating meals?: A Little Help from another person taking care of personal grooming?: A Little Help from another person toileting, which includes using toliet, bedpan, or urinal?: Total Help from another person bathing (including washing, rinsing, drying)?: A Lot Help from another person to put on and taking off regular upper body clothing?: A Lot Help from  another person to put on and taking off regular lower body clothing?: Total 6 Click Score: 12    End of Session Equipment Utilized During Treatment: Gait belt  OT Visit Diagnosis: Unsteadiness on feet (R26.81);Muscle weakness (generalized) (M62.81);Low vision, both eyes (H54.2);Other symptoms and signs involving cognitive function   Activity Tolerance Treatment limited secondary to medical complications (Comment) (decreased responsiveness EOB)   Patient Left in bed;with call bell/phone within reach;with bed alarm set;with SCD's reapplied   Nurse Communication Mobility status;Other (comment) (decreased responsiveness)        Time: 0945 [phone] OT Time Calculation  (min): 31 min  Charges: OT General Charges $OT Visit: 1 Visit OT Treatments $Self Care/Home Management : 8-22 mins  Maurie Boettcher, OT/L   Acute OT Clinical Specialist [place] Pager [phone] Office [phone]   [facility] 11/26/2021, 12:33 PM

## 2021-11-26 NOTE — Progress Notes (Signed)
   11/26/21 1745  Vitals  Temp 99.4 F (37.4 C)  Pulse Rate 68  Resp (!) 9  BP 117/77  SpO2 100 %  O2 Device Room Air  Weight 82.6 kg  Type of Weight Post-Dialysis  Oxygen Therapy  Patient Activity (if Appropriate) In bed  Pulse Oximetry Type Continuous  Oximetry Probe Site Changed No  Post Treatment  Dialyzer Clearance Clear  Duration of HD Treatment -hour(s) 3.5 hour(s)  Hemodialysis Intake (mL) 0 mL  Liters Processed 82.6  Fluid Removed 700 mL  Tolerated HD Treatment Yes   Received patient in bed  Alert and oriented.  Informed consent signed and in chart.   Treatment initiated: 1400 Treatment completed: 7159  Patient tolerated well.    Alert, without acute distress.  Hand-off given to patient's nurse.   Access used: [facility] Access issues: NONE PT TERMINATED TX 30 MINS EARLY PER REQUEST NO C/OS NO DISTRESS NOTED ADVISED OF RISKS PT VERBILIZED UNDERSTANDING MD NOTIFIED, FAMILY AT [facility]

## 2021-11-26 NOTE — Progress Notes (Signed)
Physical Therapy Treatment Patient Details Name: [name]. MRN: [id number] DOB: 06-16-1970 Today's Date: 11/26/2021   History of Present Illness 51 yo male admitted 9/30 ICH 4th ventricle and lateral ventricles.  PMH includes: ESRD on HD TTS, HTN, amputation L big toe, L eye absent, CVA, afib on plavix and Eliquis, Glaucoma, seizure, PVD, and CHF.    PT Comments    The pt was agreeable to session this morning with goal of progressing OOB mobility. The pt was able to progress to sitting EOB with minA and sequential cues for movement of all extremities, but then after continued sitting EOB required progressively increased assist to manage L lateral lean and was progressively less responsive to questions and commands. The pt was then returned to supine position where alertness improved. Will continue to attempt to progress OOB mobility and further assess possible impact of BP on mobility and responsiveness.   VITALS:  - supine in bed- BP: 124/65 (76); - supine after sitting - BP: 112/82 (89); - supine after 3 min - BP: 131/39 (68); - sitting in chair position in bed at 55 deg - BP: 92/72 (78)    Recommendations for follow up therapy are one component of a multi-disciplinary discharge planning process, led by the attending physician.  Recommendations may be updated based on patient status, additional functional criteria and insurance authorization.  Follow Up Recommendations  Acute inpatient rehab (3hours/day)     Assistance Recommended at Discharge Frequent or constant Supervision/Assistance  Patient can return home with the following Two people to help with walking and/or transfers;Direct supervision/assist for medications management;Assist for transportation;Assistance with cooking/housework;Direct supervision/assist for financial management;A lot of help with bathing/dressing/bathroom   Equipment Recommendations  Other (comment) (defer to post acute)    Recommendations for  Other Services       Precautions / Restrictions Precautions Precautions: Fall Precaution Comments: seizure, SBP 130-150; HD port R chest Restrictions Weight Bearing Restrictions: No     Mobility  Bed Mobility Overal bed mobility: Needs Assistance Bed Mobility: Supine to Sit, Sit to Supine     Supine to sit: Min assist, HOB elevated Sit to supine: Max assist, +2 for physical assistance   General bed mobility comments: minA with pt moving LE to EOB and then minA with sequential cues and assist at trunk to complete transition to sitting. pt with decreased responsiveness resulting in need for maxA of 2 to return to supine    Transfers                   General transfer comment: deferred due to decreased responsiveness       Balance Overall balance assessment: Needs assistance Sitting-balance support: Feet supported Sitting balance-Leahy Scale: Poor Sitting balance - Comments: initially minG with UE support, but progressively stronger L lateral lean needing up to mod-maxA as pt responsiveness decreased. Postural control: Posterior lean, Left lateral lean                                  Cognition Arousal/Alertness: Awake/alert Behavior During Therapy: Flat affect Overall Cognitive Status: Impaired/Different from baseline Area of Impairment: Attention, Following commands, Memory, Safety/judgement, Awareness, Problem solving                 Orientation Level: Disoriented to, Place, Time, Situation Current Attention Level: Sustained Memory: Decreased recall of precautions, Decreased short-term memory Following Commands: Follows one step commands inconsistently, Follows one step commands  with increased time Safety/Judgement: Decreased awareness of safety, Decreased awareness of deficits Awareness: Intellectual Problem Solving: Slow processing, Decreased initiation, Difficulty sequencing, Requires verbal cues General Comments: pt slow to respond  with progressively slowed responses and at times needing repetition of cues to follow instructions. pt with episode of decreased responsiveness, LOB needing increased assist and pt making no attempts to correct        Exercises General Exercises - Lower Extremity Ankle Circles/Pumps: AROM, Both, 5 reps Short Arc Quad: AROM, Both, 10 reps, Supine Other Exercises Other Exercises: pulling forwards on bed rails x3 with minimal movement of trunk    General Comments General comments (skin integrity, edema, etc.): BP unable to be taken in sitting due to urgent return to supine. 112/82 (89) in supine after return to bed, 92/72 (78) sitting in bed in chair position with HOB at 55 deg      Pertinent Vitals/Pain Pain Assessment Pain Assessment: Faces Faces Pain Scale: Hurts a little bit Pain Location: back with position in bed Pain Descriptors / Indicators: Discomfort, Grimacing Pain Intervention(s): Limited activity within patient's tolerance, Monitored during session, Repositioned     PT Goals (current goals can now be found in the care plan section) Acute Rehab PT Goals Patient Stated Goal: unable to state PT Goal Formulation: Patient unable to participate in goal setting Time For Goal Achievement: 12/07/21 Potential to Achieve Goals: Good Progress towards PT goals: Progressing toward goals    Frequency    Min 4X/week      PT Plan Current plan remains appropriate    Co-evaluation PT/OT/SLP Co-Evaluation/Treatment: Yes Reason for Co-Treatment: Complexity of the patient's impairments (multi-system involvement);Necessary to address cognition/behavior during functional activity;For patient/therapist safety;To address functional/ADL transfers PT goals addressed during session: Mobility/safety with mobility;Balance;Strengthening/ROM        AM-PAC PT "6 Clicks" Mobility   Outcome Measure  Help needed turning from your back to your side while in a flat bed without using  bedrails?: A Lot Help needed moving from lying on your back to sitting on the side of a flat bed without using bedrails?: A Lot Help needed moving to and from a bed to a chair (including a wheelchair)?: A Lot Help needed standing up from a chair using your arms (e.g., wheelchair or bedside chair)?: A Lot Help needed to walk in hospital room?: Total Help needed climbing 3-5 steps with a railing? : Total 6 Click Score: 10    End of Session Equipment Utilized During Treatment: Gait belt Activity Tolerance: Patient limited by fatigue Patient left: in bed;with call bell/phone within reach;with bed alarm set;with SCD's reapplied Nurse Communication: Mobility status PT Visit Diagnosis: Other abnormalities of gait and mobility (R26.89);Other symptoms and signs involving the nervous system (R29.898);Muscle weakness (generalized) (M62.81)     Time: 1194-1740 PT Time Calculation (min) (ACUTE ONLY): 34 min  Charges:  $Therapeutic Activity: 8-22 mins                     [name], PT, DPT   Acute Rehabilitation Department   Sandra Cockayne 11/26/2021, 10:59 AM

## 2021-11-27 DIAGNOSIS — I629 Nontraumatic intracranial hemorrhage, unspecified: Secondary | ICD-10-CM | POA: Diagnosis not present

## 2021-11-27 DIAGNOSIS — I615 Nontraumatic intracerebral hemorrhage, intraventricular: Secondary | ICD-10-CM | POA: Diagnosis not present

## 2021-11-27 DIAGNOSIS — T827XXA Infection and inflammatory reaction due to other cardiac and vascular devices, implants and grafts, initial encounter: Secondary | ICD-10-CM | POA: Diagnosis not present

## 2021-11-27 LAB — BASIC METABOLIC PANEL
Anion gap: 18 — ABNORMAL HIGH (ref 5–15)
BUN: 46 mg/dL — ABNORMAL HIGH (ref 6–20)
CO2: 22 mmol/L (ref 22–32)
Calcium: 9 mg/dL (ref 8.9–10.3)
Chloride: 93 mmol/L — ABNORMAL LOW (ref 98–111)
Creatinine, Ser: 10.28 mg/dL — ABNORMAL HIGH (ref 0.61–1.24)
GFR, Estimated: 5 mL/min — ABNORMAL LOW (ref >60)
Glucose, Bld: 83 mg/dL (ref 70–99)
Potassium: 3.9 mmol/L (ref 3.5–5.1)
Sodium: 133 mmol/L — ABNORMAL LOW (ref 135–145)

## 2021-11-27 LAB — CBC
HCT: 30.2 % — ABNORMAL LOW (ref 39.0–52.0)
Hemoglobin: 9 g/dL — ABNORMAL LOW (ref 13.0–17.0)
MCH: 21.9 pg — ABNORMAL LOW (ref 26.0–34.0)
MCHC: 29.8 g/dL — ABNORMAL LOW (ref 30.0–36.0)
MCV: 73.5 fL — ABNORMAL LOW (ref 80.0–100.0)
Platelets: 159 10*3/uL (ref 150–400)
RBC: 4.11 MIL/uL — ABNORMAL LOW (ref 4.22–5.81)
RDW: 20.1 % — ABNORMAL HIGH (ref 11.5–15.5)
WBC: 7.8 10*3/uL (ref 4.0–10.5)
nRBC: 0 % (ref 0.0–0.2)

## 2021-11-27 MED ORDER — IRBESARTAN 300 MG PO TABS
300.0000 mg | ORAL_TABLET | Freq: Every day | ORAL | Status: DC
  Administered 2021-11-27 – 2021-12-05 (×9): 300 mg via ORAL
  Filled 2021-11-27 (×9): qty 1

## 2021-11-27 NOTE — Progress Notes (Signed)
Patient ID: [name] Cooper., male   DOB: Jun 26, 1970, 51 y.o.   MRN: [id number] [facility] Progress Note   Assessment/ Plan:   1.  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  Not on antithrombotic therapy with ICH. 2. ESRD: Ordered for dialysis tomorrow to continue TTS schedule via right IJ TDC.  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 3. Anemia: Hgb currently lower without any overt losses, begin ESA.   4. CKD-MBD: Calcium level acceptable, will monitor phosphorus trends. 5. Nutrition: Continue current renal diet/fluid restriction with monitoring albumin levels to decide on need for nutritional supplementation. 6. Hypertension: Blood pressure abated on amlodipine, carvedilol and clonidine.  I will begin him on olmesartan 40 mg daily to help augment blood pressure control and wean him off of clonidine.  Subjective:   Expresses some frustration with dialysis timing yesterday interfering with OOT visiting family.   Objective:   BP (!) 164/80 (BP Location: Right Arm)   Pulse 71   Temp 99.9 F (37.7 C) (Oral)   Resp 16   Ht 6\' 3"  (1.905 m)   Wt 79.7 kg   SpO2 94%   BMI 21.96 kg/m   Physical Exam: Gen: Comfortably resting in bed, speaking on cell phone CVS: Pulse regular rhythm, normal rate, S1 and S2 normal Resp: Clear to auscultation bilaterally, no distinct rales or rhonchi.  Right IJ TDC Abd: Soft, flat, nontender, bowel sounds normal Ext: No lower extremity edema.  Status post left foot hallux amputation.  Left brachiocephalic fistula with poor augmentation and thrill in outflow.  Labs: BMET Recent Labs  Lab 11/21/21 1117 11/22/21 0054 11/24/21 0758 11/25/21 0759 11/26/21 0327 11/27/21 0429  NA 139 137 136 135 132* 133*  K 3.4* 4.0 4.5 3.8 4.0 3.9  CL 103 102  97* 94* 96* 93*  CO2 25 20* 19* 22 19* 22  GLUCOSE 88 76 71 90 99 83  BUN 28* 32* 61* 31* 46* 46*  CREATININE 7.69* 9.54* 14.22* 8.71* 10.44* 10.28*  CALCIUM 8.4* 8.7* 8.3* 8.6* 7.8* 9.0  PHOS  --   --  9.0*  --   --   --    CBC Recent Labs  Lab 11/24/21 0758 11/25/21 0759 11/26/21 0327 11/27/21 0429  WBC 9.7 10.0 9.4 7.8  HGB 8.4* 9.2* 9.1* 9.0*  HCT 28.4* 29.6* 30.4* 30.2*  MCV 76.1* 73.6* 73.4* 73.5*  PLT 119* 125* 129* 159     Medications:     amiodarone  200 mg Oral Daily   amLODipine  10 mg Oral Daily   calcium acetate  1,334 mg Oral TID WC   carvedilol  25 mg Oral BID   Chlorhexidine Gluconate Cloth  6 each Topical Daily   cloNIDine  0.3 mg Oral Q8H   darbepoetin (ARANESP) injection - DIALYSIS  150 mcg Intravenous Q Tue-HD   dorzolamide-timolol  1 drop Right Eye BID   heparin injection (subcutaneous)  5,000 Units Subcutaneous Q8H   levETIRAcetam  250 mg Oral Q T,Th,Sa-HD   levETIRAcetam  500 mg Oral BID   pantoprazole  40 mg Oral Daily   pneumococcal 20-valent conjugate vaccine  0.5 mL Intramuscular Tomorrow-1000   prednisoLONE acetate  1 drop Left Eye Q4H while awake   senna-docusate  1 tablet Oral BID   tamsulosin  0.4 mg Oral Daily   Elmarie Shiley, MD 11/27/2021, 10:22 AM

## 2021-11-27 NOTE — Progress Notes (Signed)
Speech Language Pathology Treatment: Cognitive-Linquistic  Patient Details Name: [name]. MRN: [id number] DOB: 04-06-1970 Today's Date: 11/27/2021 Time: 2751-7001 SLP Time Calculation (min) (ACUTE ONLY): 10 min  Assessment / Plan / Recommendation Clinical Impression  Pt was reluctant but agreeable to trying therapy today, telling me that the questions I ask him are too much for his brain right now. He says that he is able to participate in higher level cognitive tasks at home, such as financial management, but he has significant difficulty doing simple mental math today. With heavy cueing to break down the problem solving and assist with working memory, he is able to more accurately complete tasks. Attempted a delayed recall task today as well with pt increasing accuracy from 1/4 to 4/4 words when given cues (mostly multiple choice cues). Pt will benefit from ongoing cognitive-linguistic therapy with emphasis on ongoing differential diagnosis of abilities.    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 Pt was seen by SLP on CIR in March 2023, discharging at a supervision level for cognition. PMH: ESRD TTS, HTN, amputation L big toe, L eye absent, CVA, afib on plavix and Eliquis, Glaucoma, seizure, PVD, CHF      SLP Plan  Continue with current plan of care      Recommendations for follow up therapy are one component of a multi-disciplinary discharge planning process, led by the attending physician.  Recommendations may be updated based on patient status, additional functional criteria and insurance authorization.    Recommendations                   Follow Up Recommendations: Acute inpatient rehab (3hours/day) Assistance recommended at discharge: Frequent or constant Supervision/Assistance SLP Visit Diagnosis: Cognitive communication deficit (V49.449) Plan: Continue with current plan of care            Osie Bond., M.A. [place] Office [phone]  Secure chat preferred   11/27/2021, 12:37 PM

## 2021-11-27 NOTE — Progress Notes (Signed)
Patient stated that he does not wear CPAP regularly at home and prefers not to wear here in the hospital.

## 2021-11-27 NOTE — Care Management Important Message (Signed)
Important Message  Patient Details  Name: [name]. MRN: [id number] Date of Birth: 1971/01/18   Medicare Important Message Given:  Yes     Kammie Scioli 11/27/2021, 4:20 PM

## 2021-11-27 NOTE — Progress Notes (Signed)
Brief ID follow-up note  Blood cultures remain the same with only 1 out of 6 bottles growing Staph epidermidis prior to antibiotics being administered earlier this admission.  Discussed with hospitalist.  At this time feel that this is most consistent with contaminated blood culture.  Of note, patient continues to have ongoing fevers suspicious for possible neurologic mediated fever in the setting of intraventricular hemorrhage.  For now, we will continue to monitor off antibiotics.   Raynelle Highland [facility] 11/27/2021, 10:32 AM

## 2021-11-27 NOTE — Plan of Care (Signed)
  Problem: Education: Goal: Knowledge of disease or condition will improve Outcome: Progressing Goal: Knowledge of secondary prevention will improve (SELECT ALL) Outcome: Progressing Goal: Knowledge of patient specific risk factors will improve (INDIVIDUALIZE FOR PATIENT) Outcome: Progressing Goal: Individualized Educational Video(s) Outcome: Progressing   Problem: Education: Goal: Knowledge of General Education information will improve Description: Including pain rating scale, medication(s)/side effects and non-pharmacologic comfort measures Outcome: Progressing   Problem: Health Behavior/Discharge Planning: Goal: Ability to manage health-related needs will improve Outcome: Progressing   Problem: Clinical Measurements: Goal: Ability to maintain clinical measurements within normal limits will improve Outcome: Progressing Goal: Diagnostic test results will improve Outcome: Progressing Goal: Respiratory complications will improve Outcome: Progressing Goal: Cardiovascular complication will be avoided Outcome: Progressing   Problem: Activity: Goal: Risk for activity intolerance will decrease Outcome: Progressing   Problem: Nutrition: Goal: Adequate nutrition will be maintained Outcome: Progressing   Problem: Coping: Goal: Level of anxiety will decrease Outcome: Progressing   Problem: Elimination: Goal: Will not experience complications related to bowel motility Outcome: Progressing   Problem: Pain Managment: Goal: General experience of comfort will improve Outcome: Progressing   Problem: Safety: Goal: Ability to remain free from injury will improve Outcome: Progressing   Problem: Skin Integrity: Goal: Risk for impaired skin integrity will decrease Outcome: Progressing   Problem: Clinical Measurements: Goal: Will remain free from infection Outcome: Not Progressing   Problem: Elimination: Goal: Will not experience complications related to urinary  retention Outcome: Not Applicable

## 2021-11-27 NOTE — Progress Notes (Signed)
Inpatient Rehab Admissions Coordinator:   Continuing to follow patient for medical readiness. Will need updated therapy notes (requested) to submit to insurance for potential admission to CIR when ready and bed available. May be able to open with insurance on Monday.  Rehab Admissons Coordinator [place], [place], [place] [phone]

## 2021-11-27 NOTE — Progress Notes (Addendum)
STROKE TEAM PROGRESS NOTE   INTERVAL HISTORY Patient is laying in bed asleep. Responds to voice. Patient is annoyed with examiner bothering him. Neurological exam is unchanged. VSS   Vitals:   11/27/21 0033 11/27/21 0437 11/27/21 0636 11/27/21 0723  BP: (!) 130/50 (!) 151/67  (!) 164/80  Pulse: 71 74  71  Resp: 14   16  Temp: 99.1 F (37.3 C) (!) 101.1 F (38.4 C) 99.9 F (37.7 C) 99.9 F (37.7 C)  TempSrc:  Oral Oral Oral  SpO2: 97% 98%  94%  Weight:      Height:       CBC:  Recent Labs  Lab 11/26/21 0327 11/27/21 0429  WBC 9.4 7.8  HGB 9.1* 9.0*  HCT 30.4* 30.2*  MCV 73.4* 73.5*  PLT 129* 102    Basic Metabolic Panel:  Recent Labs  Lab 11/21/21 1117 11/22/21 0054 11/24/21 0758 11/25/21 0759 11/26/21 0327 11/27/21 0429  NA 139   < > 136   < > 132* 133*  K 3.4*   < > 4.5   < > 4.0 3.9  CL 103   < > 97*   < > 96* 93*  CO2 25   < > 19*   < > 19* 22  GLUCOSE 88   < > 71   < > 99 83  BUN 28*   < > 61*   < > 46* 46*  CREATININE 7.69*   < > 14.22*   < > 10.44* 10.28*  CALCIUM 8.4*   < > 8.3*   < > 7.8* 9.0  MG 1.9  --   --   --   --   --   PHOS  --   --  9.0*  --   --   --    < > = values in this interval not displayed.    Lipid Panel:  Recent Labs  Lab 11/21/21 1117 11/26/21 0327  CHOL 94  --   TRIG 106 124  HDL 39*  --   CHOLHDL 2.4  --   VLDL 21  --   LDLCALC 34  --     HgbA1c:  Recent Labs  Lab 11/21/21 1105  HGBA1C 4.5*    Urine Drug Screen: No results for input(s): "LABOPIA", "COCAINSCRNUR", "LABBENZ", "AMPHETMU", "THCU", "LABBARB" in the last 168 hours.  Alcohol Level No results for input(s): "ETH" in the last 168 hours.  IMAGING past 24 hours No results found.  PHYSICAL EXAM General:  Drowsy, well-nourished, well-developed patient s/p removal of left eye in no acute distress Respiratory:  Regular, unlabored respirations  NEURO:  Patient AAO x 3, tired today but awakens to voice, orientated to age and place.  No aphasia, no  significant dysarthria, follow simple commands.  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  Bilateral upper extremity 4/5, bilateral lower extremity 3/5.  Sensation symmetrical subjectively and finger-to-nose grossly intact.  Gait not tested   ASSESSMENT/PLAN [name]. is a 51 y.o. 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  He presented to the ED and was found to be hypertensive with SBP in the 200s.  Head CT revealed ICH in the fourth ventricle.  Eliquis was reversed with Andexxa. Nephrology and infectious disease on board. Dialysis TTS. Cleviprex off, bp well  controlled with PO medications today.   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 The pericardial effusion is circumferential. There is no evidence of cardiac tamponade LDL 34 HgbA1c 4.5 VTE prophylaxis - SCDs clopidogrel 75 mg daily and Eliquis (apixaban) daily prior to admission, now on No antithrombotic secondary to IVH Therapy recommendations:  CIR Disposition:  pending  Hypertensive emergency Home meds:  carvedilol 25 mg BID, amlodipine 5 mg daily, clonidine  0.1 mg TID PRN Unstable, requiring Cleviprex Now on amlodipine 10, Coreg 25 twice daily, clonidine 0.1->0.2->0.3 to 8 hours Was on hydralazine 100 Q8h -> orthostasis 10/5 with therapy ->d/c hydralazine Keep SBP  <160 Long-term BP goal normotensive  History of stroke and seizure 12/2020 admitted for syncope and unresponsiveness, CT negative 01/2021 admitted for transient slurred speech.  MRI showed left hippocampus punctate infarct.  MRA and carotid Doppler unremarkable, EEG no seizure.  LDL 38, A1c 5.7, EF 55 to 60%.  Discharged on Eliquis, Plavix and Lipitor 40 03/2021 admitted for left upper extremity weakness, CT negative.  CTA head and neck poor visualization but no significant finding.  MRI showed bilateral MCA/ACA watershed infarcts as well as left splenium small infarct.  EEG no seizure 06/2021 admitted for jerking movement for weeks.  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  MRI no acute infarct 07/2021 admitted for seizure-like activity in the setting of recent dialysis and glucose 77.  Long-term EEG monitoring no seizure.  Put on Keppra and discontinue Klonopin.  Fevers Persistent fevers Tmax 102->101.7->100.4->101.6->101.1 1 of 2 blood cultures growing MRSE, typically a contaminant, however given risk (HD catheter) of real infection and continue fevers, put on Vanc.  Repeat blood culture 4/4 so far NGTD ID consulted -> d/c vanco  Hyperlipidemia Home meds: atorvastatin 80 mg daily LDL 34, goal < 70 Hold off statin for now due to acute IVH Consider to resume statin at discharge  ESRD on HD Patient receives dialysis T, Th, Sat Appreciate nephrology input Continue dialysis on outpatient schedule  Large pericardial effusion Found on both CT chest and 2D echo Likely due to ESRD No tamponade on 2D echo Hemodynamically stable Close monitoring  Hiccup- Resolved Not able to give thorazine due to QT prolongation Not able to give baclofen due to ESRD Now on reglan Q8h PRN monitoring  Other Stroke Risk Factors PVD dCHF  Other Active Problems Nodules in left lung Chest CT shows ground glass nodules in bilateral lungs Recommend follow up chest CT every 6-12  months to monitor nodules  Hospital day # 6  Patient seen and examined by NP/APP with MD. MD to update note as needed.   Beulah Gandy DNP, ACNPC-AG  [facility] Pager: [phone]  ATTENDING NOTE: I reviewed above note and agree with the assessment and plan. Pt was seen and examined.   No family at bedside.  Patient lying bed, eyes closed, but open on voice, neurological stable, denies headache.  BP stable on current p.o. medication.  Still has low-grade fever, however blood culture still negative.  Off antibiotic per ID.  Dialysis tomorrow.  For detailed assessment and plan, please refer to above/below as I have made changes wherever appropriate.   Rosalin Hawking, MD PhD Stroke Neurology 11/27/2021 7:09 PM

## 2021-11-27 NOTE — Progress Notes (Signed)
PROGRESS NOTE    [name].  MOQ:[id number] DOB: 23-Dec-1970 DOA: 11/21/2021 PCP: [name], MD   Brief Narrative:  [name]. is a 51 y.o. male with a history of stroke, atrial fibrillation, NSTEMI and CHF with chronic on Plavix and apixaban who has been having general malaise for the past few days.  Also a cough.  He has had some nausea and vomiting over the past 2 days, but only mild headache.  When he presented to the emergency department, his blood pressure was in the 200s.  Given the nausea and vomiting and severe hypertension, the ER physician at [facility] decided to perform a head CT which demonstrates a fourth ventricle bleed. Transitioned to [facility] care 11/27/21.  Assessment & Plan:   Principal Problem:   Bacteremia associated with intravascular line ([place]) Active Problems:   Intracranial bleed (HCC)   ICH (intracerebral hemorrhage) (HCC)   Hypertensive emergency   Interventricular hemorrhage - bilateral lateral ventricles/4th ventricle without hydrocephaly Complicated by uncontrolled hypertension Confirmed on imaging -  CTA head & neck No evidence of aneurysm, no LVO or high grade stenosis Follow up CT 10/1, 10/4 stable IVH 2D Echo EF 65 to 70%, large pericardial effusion - no evidence of tamponade LDL 34 HgbA1c 4.5 VTE prophylaxis - SCDs Previously on plavix/eliquis - on hold now given active IVH  Therapy recommendations - CIR Disposition:  pending   Hypertensive emergency Home meds:  carvedilol 25 mg BID, amlodipine 5 mg daily, clonidine  0.1 mg TID PRN Currently on amlodipine 10, coreg 25, clonidine 0.3 Previously requiring Cleviprex - now off Keep SBP <160 - Long-term BP goal normotensive  History of seizure Continue keppra;   Fevers Unclear etiology - ID following Hold all abx and follow clinically Cultures negative; MRSE considered contaminant Possibly secondary to IVH   Hyperlipidemia Home meds: atorvastatin 80 mg daily LDL 34,  goal < 70 Resume statin at DC   ESRD on HD Patient receives dialysis T, Th, Sat Appreciate nephrology input Continue dialysis per nephrology   Large pericardial effusion Found on both CT chest and 2D echo Likely due to ESRD - hoping volume management will improve effusion No tamponade on 2D echo Hemodynamically stable Close monitoring   Hiccup- Resolved Not able to give thorazine due to QT prolongation Not able to give baclofen due to ESRD Now on reglan Q8h PRN    Incidental L lung nodule Recommend follow up chest CT every 6-12 months to monitor nodules  DVT prophylaxis: heparin Code Status: Full Family Communication: None present  Status is: INpt  Dispo: The patient is from: Home              Anticipated d/c is to: TBD              Anticipated d/c date is: 48-72h              Patient currently NOT medically stable for discharge  Consultants:  Neuro, nephro  Procedures:  None  Antimicrobials:  Discontinued 10/6   Subjective: No acute issues/events overnight  Objective: Vitals:   11/27/21 0033 11/27/21 0437 11/27/21 0636 11/27/21 0723  BP: (!) 130/50 (!) 151/67  (!) 164/80  Pulse: 71 74  71  Resp: 14   16  Temp: 99.1 F (37.3 C) (!) 101.1 F (38.4 C) 99.9 F (37.7 C) 99.9 F (37.7 C)  TempSrc:  Oral Oral Oral  SpO2: 97% 98%  94%  Weight:      Height:  Intake/Output Summary (Last 24 hours) at 11/27/2021 0759 Last data filed at 11/26/2021 1745 Gross per 24 hour  Intake 100 ml  Output 700 ml  Net -600 ml   Filed Weights   11/26/21 1345 11/26/21 1745 11/26/21 1957  Weight: 83.5 kg 82.6 kg 79.7 kg    Examination:  General exam: Appears calm and comfortable  Respiratory system: Clear to auscultation. Respiratory effort normal. Cardiovascular system: S1 & S2 heard, RRR. No JVD, murmurs, rubs, gallops or clicks. No pedal edema. Gastrointestinal system: Abdomen is nondistended, soft and nontender. No organomegaly or masses felt. Normal bowel  sounds heard. Central nervous system: Alert and oriented. No focal neurological deficits. Extremities: Symmetric 5 x 5 power. Skin: No rashes, lesions or ulcers Psychiatry: Judgement and insight appear normal. Mood & affect appropriate.     Data Reviewed: I have personally reviewed following labs and imaging studies  CBC: Recent Labs  Lab 11/22/21 0054 11/24/21 0758 11/25/21 0759 11/26/21 0327 11/27/21 0429  WBC 7.2 9.7 10.0 9.4 7.8  HGB 10.0* 8.4* 9.2* 9.1* 9.0*  HCT 34.2* 28.4* 29.6* 30.4* 30.2*  MCV 76.9* 76.1* 73.6* 73.4* 73.5*  PLT 120* 119* 125* 129* 779   Basic Metabolic Panel: Recent Labs  Lab 11/21/21 1117 11/22/21 0054 11/24/21 0758 11/25/21 0759 11/26/21 0327 11/27/21 0429  NA 139 137 136 135 132* 133*  K 3.4* 4.0 4.5 3.8 4.0 3.9  CL 103 102 97* 94* 96* 93*  CO2 25 20* 19* 22 19* 22  GLUCOSE 88 76 71 90 99 83  BUN 28* 32* 61* 31* 46* 46*  CREATININE 7.69* 9.54* 14.22* 8.71* 10.44* 10.28*  CALCIUM 8.4* 8.7* 8.3* 8.6* 7.8* 9.0  MG 1.9  --   --   --   --   --   PHOS  --   --  9.0*  --   --   --    GFR: Estimated Creatinine Clearance: 9.4 mL/min (A) (by C-G formula based on SCr of 10.28 mg/dL (H)). Liver Function Tests: Recent Labs  Lab 11/21/21 1117 11/24/21 0758  AST 12*  --   ALT 8  --   ALKPHOS 110  --   BILITOT 1.2  --   PROT 6.5  --   ALBUMIN 3.3* 2.7*   No results for input(s): "LIPASE", "AMYLASE" in the last 168 hours. No results for input(s): "AMMONIA" in the last 168 hours. Coagulation Profile: Recent Labs  Lab 11/21/21 1537  INR 1.6*   Cardiac Enzymes: No results for input(s): "CKTOTAL", "CKMB", "CKMBINDEX", "TROPONINI" in the last 168 hours. BNP (last 3 results) No results for input(s): "PROBNP" in the last 8760 hours. HbA1C: No results for input(s): "HGBA1C" in the last 72 hours. CBG: No results for input(s): "GLUCAP" in the last 168 hours. Lipid Profile: Recent Labs    11/26/21 0327  TRIG 124   Thyroid Function  Tests: No results for input(s): "TSH", "T4TOTAL", "FREET4", "T3FREE", "THYROIDAB" in the last 72 hours. Anemia Panel: No results for input(s): "VITAMINB12", "FOLATE", "FERRITIN", "TIBC", "IRON", "RETICCTPCT" in the last 72 hours. Sepsis Labs: No results for input(s): "PROCALCITON", "LATICACIDVEN" in the last 168 hours.  Recent Results (from the past 240 hour(s))  Resp Panel by RT-PCR (Flu A&B, Covid) Anterior Nasal Swab     Status: None   Collection Time: 11/21/21  5:58 PM   Specimen: Anterior Nasal Swab  Result Value Ref Range Status   SARS Coronavirus 2 by RT PCR NEGATIVE NEGATIVE Final    Comment: (NOTE) SARS-CoV-2 target  nucleic acids are NOT DETECTED.  The SARS-CoV-2 RNA is generally detectable in upper respiratory specimens during the acute phase of infection. The lowest concentration of SARS-CoV-2 viral copies this assay can detect is 138 copies/mL. A negative result does not preclude SARS-Cov-2 infection and should not be used as the sole basis for treatment or other patient management decisions. A negative result may occur with  improper specimen collection/handling, submission of specimen other than nasopharyngeal swab, presence of viral mutation(s) within the areas targeted by this assay, and inadequate number of viral copies(<138 copies/mL). A negative result must be combined with clinical observations, patient history, and epidemiological information. The expected result is Negative.  Fact Sheet for Patients:  EntrepreneurPulse.com.au  Fact Sheet for Healthcare Providers:  IncredibleEmployment.be  This test is no t yet approved or cleared by the [place] FDA and  has been authorized for detection and/or diagnosis of SARS-CoV-2 by FDA under an Emergency Use Authorization (EUA). This EUA will remain  in effect (meaning this test can be used) for the duration of the COVID-19 declaration under Section 564(b)(1) of the Act,  21 U.S.C.section 360bbb-3(b)(1), unless the authorization is terminated  or revoked sooner.       Influenza A by PCR NEGATIVE NEGATIVE Final   Influenza B by PCR NEGATIVE NEGATIVE Final    Comment: (NOTE) The Xpert Xpress SARS-CoV-2/FLU/RSV plus assay is intended as an aid in the diagnosis of influenza from Nasopharyngeal swab specimens and should not be used as a sole basis for treatment. Nasal washings and aspirates are unacceptable for Xpert Xpress SARS-CoV-2/FLU/RSV testing.  Fact Sheet for Patients: EntrepreneurPulse.com.au  Fact Sheet for Healthcare Providers: IncredibleEmployment.be  This test is not yet approved or cleared by the [place] FDA and has been authorized for detection and/or diagnosis of SARS-CoV-2 by FDA under an Emergency Use Authorization (EUA). This EUA will remain in effect (meaning this test can be used) for the duration of the COVID-19 declaration under Section 564(b)(1) of the Act, 21 U.S.C. section 360bbb-3(b)(1), unless the authorization is terminated or revoked.  Performed at [facility], [address], [place], [place] [address]   MRSA Next Gen by PCR, Nasal     Status: None   Collection Time: 11/21/21 11:28 PM   Specimen: Nasal Mucosa; Nasal Swab  Result Value Ref Range Status   MRSA by PCR Next Gen NOT DETECTED NOT DETECTED Final    Comment: (NOTE) The GeneXpert MRSA Assay (FDA approved for NASAL specimens only), is one component of a comprehensive MRSA colonization surveillance program. It is not intended to diagnose MRSA infection nor to guide or monitor treatment for MRSA infections. Test performance is not FDA approved in patients less than 45 years old. Performed at [facility], [place] [address], [place],  [address]   Culture, blood (Routine X 2) w Reflex to ID Panel     Status: Abnormal   Collection Time: 11/22/21 12:54 AM   Specimen: BLOOD LEFT ARM  Result Value Ref Range  Status   Specimen Description BLOOD LEFT ARM  Final   Special Requests   Final    BOTTLES DRAWN AEROBIC AND ANAEROBIC Blood Culture results may not be optimal due to an inadequate volume of blood received in culture bottles   Culture  Setup Time   Final    GRAM POSITIVE COCCI ANAEROBIC BOTTLE ONLY CRITICAL RESULT CALLED TO, READ BACK BY AND VERIFIED WITH: V BRYK,PHARMD@[id number]  11/23/21 [place]    Culture (A)  Final  STAPHYLOCOCCUS EPIDERMIDIS THE SIGNIFICANCE OF ISOLATING THIS ORGANISM FROM A SINGLE SET OF BLOOD CULTURES WHEN MULTIPLE SETS ARE DRAWN IS UNCERTAIN. PLEASE NOTIFY THE MICROBIOLOGY DEPARTMENT WITHIN ONE WEEK IF SPECIATION AND SENSITIVITIES ARE REQUIRED. Performed at [facility], [place] [address] [place], [place] [address]    Report Status 11/25/2021 FINAL  Final  Blood Culture ID Panel (Reflexed)     Status: Abnormal   Collection Time: 11/22/21 12:54 AM  Result Value Ref Range Status   Enterococcus faecalis NOT DETECTED NOT DETECTED Final   Enterococcus Faecium NOT DETECTED NOT DETECTED Final   Listeria monocytogenes NOT DETECTED NOT DETECTED Final   Staphylococcus species DETECTED (A) NOT DETECTED Final    Comment: CRITICAL RESULT CALLED TO, READ BACK BY AND VERIFIED WITH: V BRYK,PHARMD@0530  11/23/21 [place]    Staphylococcus aureus (BCID) NOT DETECTED NOT DETECTED Final   Staphylococcus epidermidis DETECTED (A) NOT DETECTED Final    Comment: Methicillin (oxacillin) resistant coagulase negative staphylococcus. Possible blood culture contaminant (unless isolated from more than one blood culture draw or clinical case suggests pathogenicity). No antibiotic treatment is indicated for blood  culture contaminants. CRITICAL RESULT CALLED TO, READ BACK BY AND VERIFIED WITH: V BRYK,PHARMD@0533  11/23/21 [place]    Staphylococcus lugdunensis NOT DETECTED NOT DETECTED Final   Streptococcus species NOT DETECTED NOT DETECTED Final   Streptococcus agalactiae NOT DETECTED NOT DETECTED Final    Streptococcus pneumoniae NOT DETECTED NOT DETECTED Final   Streptococcus pyogenes NOT DETECTED NOT DETECTED Final   A.calcoaceticus-baumannii NOT DETECTED NOT DETECTED Final   Bacteroides fragilis NOT DETECTED NOT DETECTED Final   Enterobacterales NOT DETECTED NOT DETECTED Final   Enterobacter cloacae complex NOT DETECTED NOT DETECTED Final   Escherichia coli NOT DETECTED NOT DETECTED Final   Klebsiella aerogenes NOT DETECTED NOT DETECTED Final   Klebsiella oxytoca NOT DETECTED NOT DETECTED Final   Klebsiella pneumoniae NOT DETECTED NOT DETECTED Final   Proteus species NOT DETECTED NOT DETECTED Final   Salmonella species NOT DETECTED NOT DETECTED Final   Serratia marcescens NOT DETECTED NOT DETECTED Final   Haemophilus influenzae NOT DETECTED NOT DETECTED Final   Neisseria meningitidis NOT DETECTED NOT DETECTED Final   Pseudomonas aeruginosa NOT DETECTED NOT DETECTED Final   Stenotrophomonas maltophilia NOT DETECTED NOT DETECTED Final   Candida albicans NOT DETECTED NOT DETECTED Final   Candida auris NOT DETECTED NOT DETECTED Final   Candida glabrata NOT DETECTED NOT DETECTED Final   Candida krusei NOT DETECTED NOT DETECTED Final   Candida parapsilosis NOT DETECTED NOT DETECTED Final   Candida tropicalis NOT DETECTED NOT DETECTED Final   Cryptococcus neoformans/gattii NOT DETECTED NOT DETECTED Final   Methicillin resistance mecA/C DETECTED (A) NOT DETECTED Final    Comment: CRITICAL RESULT CALLED TO, READ BACK BY AND VERIFIED WITH: V BRYK,PHARMD@0532  11/23/21 [place] Performed at [facility], [address] [place], [place] [address]   Culture, blood (Routine X 2) w Reflex to ID Panel     Status: None (Preliminary result)   Collection Time: 11/23/21 10:52 AM   Specimen: BLOOD  Result Value Ref Range Status   Specimen Description BLOOD BLOOD RIGHT HAND  Final   Special Requests   Final    BOTTLES DRAWN AEROBIC AND ANAEROBIC Blood Culture adequate volume   Culture   Final    NO  GROWTH 3 DAYS Performed at [facility], [place] [address], [place], [place] [address]    Report Status PENDING  Incomplete  Culture, blood (Routine X  2) w Reflex to ID Panel     Status: None (Preliminary result)   Collection Time: 11/23/21 10:53 AM   Specimen: BLOOD  Result Value Ref Range Status   Specimen Description BLOOD SITE NOT SPECIFIED  Final   Special Requests   Final    BOTTLES DRAWN AEROBIC AND ANAEROBIC Blood Culture adequate volume   Culture   Final    NO GROWTH 3 DAYS Performed at [facility], [address] [place], [place] [address]    Report Status PENDING  Incomplete         Radiology Studies: No results found.      Scheduled Meds:  amiodarone  200 mg Oral Daily   amLODipine  10 mg Oral Daily   calcium acetate  1,334 mg Oral TID WC   carvedilol  25 mg Oral BID   Chlorhexidine Gluconate Cloth  6 each Topical Daily   cloNIDine  0.3 mg Oral Q8H   darbepoetin (ARANESP) injection - DIALYSIS  150 mcg Intravenous Q Tue-HD   dorzolamide-timolol  1 drop Right Eye BID   heparin injection (subcutaneous)  5,000 Units Subcutaneous Q8H   levETIRAcetam  250 mg Oral Q T,Th,Sa-HD   levETIRAcetam  500 mg Oral BID   pantoprazole  40 mg Oral Daily   pneumococcal 20-valent conjugate vaccine  0.5 mL Intramuscular Tomorrow-1000   prednisoLONE acetate  1 drop Left Eye Q4H while awake   senna-docusate  1 tablet Oral BID   tamsulosin  0.4 mg Oral Daily   Continuous Infusions:  anticoagulant sodium citrate     anticoagulant sodium citrate       LOS: 6 days   Time spent: 57min  Trayvon Trumbull C Janus Vlcek, DO [facility]  If 7PM-7AM, please contact night-coverage [email or web address]  11/27/2021, 7:59 AM

## 2021-11-28 DIAGNOSIS — R7881 Bacteremia: Secondary | ICD-10-CM | POA: Diagnosis not present

## 2021-11-28 DIAGNOSIS — I161 Hypertensive emergency: Secondary | ICD-10-CM | POA: Diagnosis not present

## 2021-11-28 DIAGNOSIS — I629 Nontraumatic intracranial hemorrhage, unspecified: Secondary | ICD-10-CM | POA: Diagnosis not present

## 2021-11-28 DIAGNOSIS — N186 End stage renal disease: Secondary | ICD-10-CM | POA: Diagnosis not present

## 2021-11-28 DIAGNOSIS — T827XXA Infection and inflammatory reaction due to other cardiac and vascular devices, implants and grafts, initial encounter: Secondary | ICD-10-CM | POA: Diagnosis not present

## 2021-11-28 DIAGNOSIS — I615 Nontraumatic intracerebral hemorrhage, intraventricular: Secondary | ICD-10-CM | POA: Diagnosis not present

## 2021-11-28 LAB — CBC
HCT: 29.2 % — ABNORMAL LOW (ref 39.0–52.0)
Hemoglobin: 8.8 g/dL — ABNORMAL LOW (ref 13.0–17.0)
MCH: 22.2 pg — ABNORMAL LOW (ref 26.0–34.0)
MCHC: 30.1 g/dL (ref 30.0–36.0)
MCV: 73.7 fL — ABNORMAL LOW (ref 80.0–100.0)
Platelets: 200 10*3/uL (ref 150–400)
RBC: 3.96 MIL/uL — ABNORMAL LOW (ref 4.22–5.81)
RDW: 19.6 % — ABNORMAL HIGH (ref 11.5–15.5)
WBC: 7.4 10*3/uL (ref 4.0–10.5)
nRBC: 0 % (ref 0.0–0.2)

## 2021-11-28 LAB — CULTURE, BLOOD (ROUTINE X 2)
Culture: NO GROWTH
Culture: NO GROWTH
Special Requests: ADEQUATE
Special Requests: ADEQUATE

## 2021-11-28 MED ORDER — HEPARIN SODIUM (PORCINE) 1000 UNIT/ML IJ SOLN
INTRAMUSCULAR | Status: AC
  Administered 2021-11-28: 1000 [IU]
  Filled 2021-11-28: qty 5

## 2021-11-28 NOTE — Procedures (Signed)
Patient seen on Hemodialysis. BP (!) 165/87   Pulse (!) 58   Temp 99.3 F (37.4 C) (Oral)   Resp (!) 0   Ht 6\' 3"  (1.905 m)   Wt 79.2 kg   SpO2 99%   BMI 21.82 kg/m   QB 400, UF goal 1L Tolerating treatment without complaints at this time.   Elmarie Shiley MD [facility]. Office # [phone] Pager # [phone] 9:34 AM

## 2021-11-28 NOTE — Progress Notes (Signed)
PROGRESS NOTE    [name].  FTD:[id number] DOB: Dec 11, 1970 DOA: 11/21/2021 PCP: [name], MD   Brief Narrative:  [name] Rudnick. is a 51 y.o. male with a history of stroke, atrial fibrillation, NSTEMI and CHF with chronic on Plavix and apixaban who has been having general malaise for the past few days.  Also a cough.  He has had some nausea and vomiting over the past 2 days, but only mild headache.  When he presented to the emergency department, his blood pressure was in the 200s.  Given the nausea and vomiting and severe hypertension, the ER physician at [facility] decided to perform a head CT which demonstrates a fourth ventricle bleed. Transitioned to [facility] care 11/27/21.  Assessment & Plan:   Principal Problem:   Bacteremia associated with intravascular line ([place]) Active Problems:   Intracranial bleed (HCC)   ICH (intracerebral hemorrhage) (HCC)   Hypertensive emergency   Interventricular hemorrhage - bilateral lateral ventricles/4th ventricle without hydrocephaly Complicated by uncontrolled hypertension Confirmed on imaging -  CTA head & neck No evidence of aneurysm, no LVO or high grade stenosis Follow up CT 10/1, 10/4 stable IVH 2D Echo EF 65 to 70%, large pericardial effusion - no evidence of tamponade LDL 34 HgbA1c 4.5 VTE prophylaxis - SCDs Previously on plavix/eliquis - on hold now given active IVH    Hypertensive emergency Home meds:  carvedilol 25 mg BID, amlodipine 5 mg daily, clonidine  0.1 mg TID PRN Currently on amlodipine 10, coreg 25, clonidine 0.3 Previously requiring Cleviprex - now off Keep SBP <160 - Long-term BP goal normotensive  History of seizure Continue keppra;   Fevers Unclear etiology - ID following Hold all abx and follow clinically Cultures negative; MRSE considered contaminant Possibly secondary to IVH   Hyperlipidemia Home meds: atorvastatin 80 mg daily LDL 34, goal < 70 Resume statin at DC   ESRD on HD Patient  receives dialysis T, Th, Sat Appreciate nephrology input Continue dialysis per nephrology   Large pericardial effusion Found on both CT chest and 2D echo Likely due to ESRD - hoping volume management will improve effusion No tamponade on 2D echo Hemodynamically stable Close monitoring   Hiccup- Resolved Not able to give thorazine due to QT prolongation Not able to give baclofen due to ESRD Now on reglan Q8h PRN    Incidental L lung nodule Recommend follow up chest CT every 6-12 months to monitor nodules  DVT prophylaxis: heparin Code Status: Full Family Communication: None present  Status is: INpt  Dispo: The patient is from: Home              Anticipated d/c is to: CIR per PT recs              Anticipated d/c date is: 48-72h              Patient currently NOT medically stable for discharge  Consultants:  Neuro, nephro  Procedures:  None  Antimicrobials:  Discontinued 10/6   Subjective: No acute issues/events overnight, tolerating HD well today  Objective: Vitals:   11/28/21 0023 11/28/21 0027 11/28/21 0316 11/28/21 0757  BP: (!) 168/73  (!) 161/80 (!) 147/52  Pulse: 66  72 63  Resp: 18  18   Temp: (!) 100.8 F (38.2 C)  (!) 100.5 F (38.1 C) 99.9 F (37.7 C)  TempSrc: Oral  Oral Oral  SpO2: 99%  98% 100%  Weight:  79.1 kg    Height:  Intake/Output Summary (Last 24 hours) at 11/28/2021 0803 Last data filed at 11/27/2021 1300 Gross per 24 hour  Intake 200 ml  Output --  Net 200 ml    Filed Weights   11/26/21 1745 11/26/21 1957 11/28/21 0027  Weight: 82.6 kg 79.7 kg 79.1 kg    Examination:  General exam: Appears calm and comfortable  Respiratory system: Clear to auscultation. Respiratory effort normal. Cardiovascular system: S1 & S2 heard, RRR. No JVD, murmurs, rubs, gallops or clicks. No pedal edema. Gastrointestinal system: Abdomen is nondistended, soft and nontender. No organomegaly or masses felt. Normal bowel sounds heard. Central  nervous system: Alert and oriented. No focal neurological deficits. Extremities: Symmetric 5 x 5 power. Skin: No rashes, lesions or ulcers Psychiatry: Judgement and insight appear normal. Mood & affect appropriate.     Data Reviewed: I have personally reviewed following labs and imaging studies  CBC: Recent Labs  Lab 11/24/21 0758 11/25/21 0759 11/26/21 0327 11/27/21 0429 11/28/21 0334  WBC 9.7 10.0 9.4 7.8 7.4  HGB 8.4* 9.2* 9.1* 9.0* 8.8*  HCT 28.4* 29.6* 30.4* 30.2* 29.2*  MCV 76.1* 73.6* 73.4* 73.5* 73.7*  PLT 119* 125* 129* 159 027    Basic Metabolic Panel: Recent Labs  Lab 11/21/21 1117 11/22/21 0054 11/24/21 0758 11/25/21 0759 11/26/21 0327 11/27/21 0429  NA 139 137 136 135 132* 133*  K 3.4* 4.0 4.5 3.8 4.0 3.9  CL 103 102 97* 94* 96* 93*  CO2 25 20* 19* 22 19* 22  GLUCOSE 88 76 71 90 99 83  BUN 28* 32* 61* 31* 46* 46*  CREATININE 7.69* 9.54* 14.22* 8.71* 10.44* 10.28*  CALCIUM 8.4* 8.7* 8.3* 8.6* 7.8* 9.0  MG 1.9  --   --   --   --   --   PHOS  --   --  9.0*  --   --   --     GFR: Estimated Creatinine Clearance: 9.3 mL/min (A) (by C-G formula based on SCr of 10.28 mg/dL (H)). Liver Function Tests: Recent Labs  Lab 11/21/21 1117 11/24/21 0758  AST 12*  --   ALT 8  --   ALKPHOS 110  --   BILITOT 1.2  --   PROT 6.5  --   ALBUMIN 3.3* 2.7*    No results for input(s): "LIPASE", "AMYLASE" in the last 168 hours. No results for input(s): "AMMONIA" in the last 168 hours. Coagulation Profile: Recent Labs  Lab 11/21/21 1537  INR 1.6*    Cardiac Enzymes: No results for input(s): "CKTOTAL", "CKMB", "CKMBINDEX", "TROPONINI" in the last 168 hours. BNP (last 3 results) No results for input(s): "PROBNP" in the last 8760 hours. HbA1C: No results for input(s): "HGBA1C" in the last 72 hours. CBG: No results for input(s): "GLUCAP" in the last 168 hours. Lipid Profile: Recent Labs    11/26/21 0327  TRIG 124    Thyroid Function Tests: No results  for input(s): "TSH", "T4TOTAL", "FREET4", "T3FREE", "THYROIDAB" in the last 72 hours. Anemia Panel: No results for input(s): "VITAMINB12", "FOLATE", "FERRITIN", "TIBC", "IRON", "RETICCTPCT" in the last 72 hours. Sepsis Labs: No results for input(s): "PROCALCITON", "LATICACIDVEN" in the last 168 hours.  Recent Results (from the past 240 hour(s))  Resp Panel by RT-PCR (Flu A&B, Covid) Anterior Nasal Swab     Status: None   Collection Time: 11/21/21  5:58 PM   Specimen: Anterior Nasal Swab  Result Value Ref Range Status   SARS Coronavirus 2 by RT PCR NEGATIVE NEGATIVE Final  Comment: (NOTE) SARS-CoV-2 target nucleic acids are NOT DETECTED.  The SARS-CoV-2 RNA is generally detectable in upper respiratory specimens during the acute phase of infection. The lowest concentration of SARS-CoV-2 viral copies this assay can detect is 138 copies/mL. A negative result does not preclude SARS-Cov-2 infection and should not be used as the sole basis for treatment or other patient management decisions. A negative result may occur with  improper specimen collection/handling, submission of specimen other than nasopharyngeal swab, presence of viral mutation(s) within the areas targeted by this assay, and inadequate number of viral copies(<138 copies/mL). A negative result must be combined with clinical observations, patient history, and epidemiological information. The expected result is Negative.  Fact Sheet for Patients:  EntrepreneurPulse.com.au  Fact Sheet for Healthcare Providers:  IncredibleEmployment.be  This test is no t yet approved or cleared by the [place] FDA and  has been authorized for detection and/or diagnosis of SARS-CoV-2 by FDA under an Emergency Use Authorization (EUA). This EUA will remain  in effect (meaning this test can be used) for the duration of the COVID-19 declaration under Section 564(b)(1) of the Act, 21 U.S.C.section  360bbb-3(b)(1), unless the authorization is terminated  or revoked sooner.       Influenza A by PCR NEGATIVE NEGATIVE Final   Influenza B by PCR NEGATIVE NEGATIVE Final    Comment: (NOTE) The Xpert Xpress SARS-CoV-2/FLU/RSV plus assay is intended as an aid in the diagnosis of influenza from Nasopharyngeal swab specimens and should not be used as a sole basis for treatment. Nasal washings and aspirates are unacceptable for Xpert Xpress SARS-CoV-2/FLU/RSV testing.  Fact Sheet for Patients: EntrepreneurPulse.com.au  Fact Sheet for Healthcare Providers: IncredibleEmployment.be  This test is not yet approved or cleared by the [place] FDA and has been authorized for detection and/or diagnosis of SARS-CoV-2 by FDA under an Emergency Use Authorization (EUA). This EUA will remain in effect (meaning this test can be used) for the duration of the COVID-19 declaration under Section 564(b)(1) of the Act, 21 U.S.C. section 360bbb-3(b)(1), unless the authorization is terminated or revoked.  Performed at [facility], [address] [place], [place] [address]   MRSA Next Gen by PCR, Nasal     Status: None   Collection Time: 11/21/21 11:28 PM   Specimen: Nasal Mucosa; Nasal Swab  Result Value Ref Range Status   MRSA by PCR Next Gen NOT DETECTED NOT DETECTED Final    Comment: (NOTE) The GeneXpert MRSA Assay (FDA approved for NASAL specimens only), is one component of a comprehensive MRSA colonization surveillance program. It is not intended to diagnose MRSA infection nor to guide or monitor treatment for MRSA infections. Test performance is not FDA approved in patients less than 69 years old. Performed at [facility], [place] [address], [place], [place] [address]   Culture, blood (Routine X 2) w Reflex to ID Panel     Status: Abnormal   Collection Time: 11/22/21 12:54 AM   Specimen: BLOOD LEFT ARM  Result Value Ref Range Status   Specimen  Description BLOOD LEFT ARM  Final   Special Requests   Final    BOTTLES DRAWN AEROBIC AND ANAEROBIC Blood Culture results may not be optimal due to an inadequate volume of blood received in culture bottles   Culture  Setup Time   Final    GRAM POSITIVE COCCI ANAEROBIC BOTTLE ONLY CRITICAL RESULT CALLED TO, READ BACK BY AND VERIFIED WITH: V BRYK,PHARMD@0531  11/23/21 [place]    Culture (A)  Final    STAPHYLOCOCCUS EPIDERMIDIS THE SIGNIFICANCE OF ISOLATING THIS ORGANISM FROM A SINGLE SET OF BLOOD CULTURES WHEN MULTIPLE SETS ARE DRAWN IS UNCERTAIN. PLEASE NOTIFY THE MICROBIOLOGY DEPARTMENT WITHIN ONE WEEK IF SPECIATION AND SENSITIVITIES ARE REQUIRED. Performed at [facility], [place] [address], [place], [place] [address]    Report Status 11/25/2021 FINAL  Final  Blood Culture ID Panel (Reflexed)     Status: Abnormal   Collection Time: 11/22/21 12:54 AM  Result Value Ref Range Status   Enterococcus faecalis NOT DETECTED NOT DETECTED Final   Enterococcus Faecium NOT DETECTED NOT DETECTED Final   Listeria monocytogenes NOT DETECTED NOT DETECTED Final   Staphylococcus species DETECTED (A) NOT DETECTED Final    Comment: CRITICAL RESULT CALLED TO, READ BACK BY AND VERIFIED WITH: V BRYK,PHARMD@0530  11/23/21 [place]    Staphylococcus aureus (BCID) NOT DETECTED NOT DETECTED Final   Staphylococcus epidermidis DETECTED (A) NOT DETECTED Final    Comment: Methicillin (oxacillin) resistant coagulase negative staphylococcus. Possible blood culture contaminant (unless isolated from more than one blood culture draw or clinical case suggests pathogenicity). No antibiotic treatment is indicated for blood  culture contaminants. CRITICAL RESULT CALLED TO, READ BACK BY AND VERIFIED WITH: V BRYK,PHARMD@0533  11/23/21 [place]    Staphylococcus lugdunensis NOT DETECTED NOT DETECTED Final   Streptococcus species NOT DETECTED NOT DETECTED Final   Streptococcus agalactiae NOT DETECTED NOT DETECTED Final   Streptococcus  pneumoniae NOT DETECTED NOT DETECTED Final   Streptococcus pyogenes NOT DETECTED NOT DETECTED Final   A.calcoaceticus-baumannii NOT DETECTED NOT DETECTED Final   Bacteroides fragilis NOT DETECTED NOT DETECTED Final   Enterobacterales NOT DETECTED NOT DETECTED Final   Enterobacter cloacae complex NOT DETECTED NOT DETECTED Final   Escherichia coli NOT DETECTED NOT DETECTED Final   Klebsiella aerogenes NOT DETECTED NOT DETECTED Final   Klebsiella oxytoca NOT DETECTED NOT DETECTED Final   Klebsiella pneumoniae NOT DETECTED NOT DETECTED Final   Proteus species NOT DETECTED NOT DETECTED Final   Salmonella species NOT DETECTED NOT DETECTED Final   Serratia marcescens NOT DETECTED NOT DETECTED Final   Haemophilus influenzae NOT DETECTED NOT DETECTED Final   Neisseria meningitidis NOT DETECTED NOT DETECTED Final   Pseudomonas aeruginosa NOT DETECTED NOT DETECTED Final   Stenotrophomonas maltophilia NOT DETECTED NOT DETECTED Final   Candida albicans NOT DETECTED NOT DETECTED Final   Candida auris NOT DETECTED NOT DETECTED Final   Candida glabrata NOT DETECTED NOT DETECTED Final   Candida krusei NOT DETECTED NOT DETECTED Final   Candida parapsilosis NOT DETECTED NOT DETECTED Final   Candida tropicalis NOT DETECTED NOT DETECTED Final   Cryptococcus neoformans/gattii NOT DETECTED NOT DETECTED Final   Methicillin resistance mecA/C DETECTED (A) NOT DETECTED Final    Comment: CRITICAL RESULT CALLED TO, READ BACK BY AND VERIFIED WITH: V BRYK,PHARMD@0532  11/23/21 [place] Performed at [facility], [address] [place], [place] [address]   Culture, blood (Routine X 2) w Reflex to ID Panel     Status: None   Collection Time: 11/23/21 10:52 AM   Specimen: BLOOD  Result Value Ref Range Status   Specimen Description BLOOD BLOOD RIGHT HAND  Final   Special Requests   Final    BOTTLES DRAWN AEROBIC AND ANAEROBIC Blood Culture adequate volume   Culture   Final    NO GROWTH 5 DAYS Performed at [facility], [place] [address] [place],  [address]    Report Status 11/28/2021 FINAL  Final  Culture,  blood (Routine X 2) w Reflex to ID Panel     Status: None   Collection Time: 11/23/21 10:53 AM   Specimen: BLOOD  Result Value Ref Range Status   Specimen Description BLOOD SITE NOT SPECIFIED  Final   Special Requests   Final    BOTTLES DRAWN AEROBIC AND ANAEROBIC Blood Culture adequate volume   Culture   Final    NO GROWTH 5 DAYS Performed at [facility], [address], [place], [place] [address]    Report Status 11/28/2021 FINAL  Final         Radiology Studies: No results found.      Scheduled Meds:  amiodarone  200 mg Oral Daily   amLODipine  10 mg Oral Daily   calcium acetate  1,334 mg Oral TID WC   carvedilol  25 mg Oral BID   Chlorhexidine Gluconate Cloth  6 each Topical Daily   cloNIDine  0.3 mg Oral Q8H   darbepoetin (ARANESP) injection - DIALYSIS  150 mcg Intravenous Q Tue-HD   dorzolamide-timolol  1 drop Right Eye BID   heparin injection (subcutaneous)  5,000 Units Subcutaneous Q8H   irbesartan  300 mg Oral Daily   levETIRAcetam  250 mg Oral Q T,Th,Sa-HD   levETIRAcetam  500 mg Oral BID   pantoprazole  40 mg Oral Daily   pneumococcal 20-valent conjugate vaccine  0.5 mL Intramuscular Tomorrow-1000   prednisoLONE acetate  1 drop Left Eye Q4H while awake   senna-docusate  1 tablet Oral BID   tamsulosin  0.4 mg Oral Daily   Continuous Infusions:  anticoagulant sodium citrate     anticoagulant sodium citrate       LOS: 7 days   Time spent: 49min  [name] Frede C Garren Greenman, DO [facility]  If 7PM-7AM, please contact night-coverage [email or web address]  11/28/2021, 8:03 AM

## 2021-11-28 NOTE — Progress Notes (Signed)
PT Cancellation Note  Patient Details Name: [name]. MRN: [id number] DOB: 01-24-1971   Cancelled Treatment:    Reason Eval/Treat Not Completed: Patient at procedure or test/unavailable. Pt in HD.   Lorriane Shire 11/28/2021, 8:52 AM

## 2021-11-28 NOTE — Plan of Care (Signed)
?  Problem: Education: ?Goal: Knowledge of disease or condition will improve ?Outcome: Progressing ?Goal: Knowledge of secondary prevention will improve ([place]) ?Outcome: Progressing ?Goal: Knowledge of patient specific risk factors will improve (INDIVIDUALIZE FOR PATIENT) ?Outcome: Progressing ?  ?

## 2021-11-28 NOTE — Progress Notes (Signed)
Received patient in bed to unit.  Alert and oriented.  Informed consent signed and in chart.   Treatment initiated: 3672 Treatment completed: 1220  Patient tolerated well.  Transported back to the room  Alert, without acute distress.  Hand-off given to patient's nurse.   Access used: catheter Access issues: none  Total UF removed: 1000 Medication(s) given: none Post HD VS: 99.3, 174/74(101), HR-63, RR-16, SP02-98 Post HD weight: 78.2kg   [facility]

## 2021-11-28 NOTE — Progress Notes (Addendum)
STROKE TEAM PROGRESS NOTE   INTERVAL HISTORY Patient seen after HD. He is asleep in his bed in NAD. Easily awakens, No new neurological events overnight. No family at the bedside. VSS  Vitals:   11/28/21 0900 11/28/21 0930 11/28/21 1000 11/28/21 1030  BP: (!) 165/87 (!) 147/76 (!) 154/73 (!) 165/75  Pulse: (!) 58 (!) 59 62 62  Resp: (!) 0 (!) 0 (!) 4 13  Temp:      TempSrc:      SpO2: 99% 98% 97% 99%  Weight:      Height:       CBC:  Recent Labs  Lab 11/27/21 0429 11/28/21 0334  WBC 7.8 7.4  HGB 9.0* 8.8*  HCT 30.2* 29.2*  MCV 73.5* 73.7*  PLT 159 161    Basic Metabolic Panel:  Recent Labs  Lab 11/21/21 1117 11/22/21 0054 11/24/21 0758 11/25/21 0759 11/26/21 0327 11/27/21 0429  NA 139   < > 136   < > 132* 133*  K 3.4*   < > 4.5   < > 4.0 3.9  CL 103   < > 97*   < > 96* 93*  CO2 25   < > 19*   < > 19* 22  GLUCOSE 88   < > 71   < > 99 83  BUN 28*   < > 61*   < > 46* 46*  CREATININE 7.69*   < > 14.22*   < > 10.44* 10.28*  CALCIUM 8.4*   < > 8.3*   < > 7.8* 9.0  MG 1.9  --   --   --   --   --   PHOS  --   --  9.0*  --   --   --    < > = values in this interval not displayed.    Lipid Panel:  Recent Labs  Lab 11/21/21 1117 11/26/21 0327  CHOL 94  --   TRIG 106 124  HDL 39*  --   CHOLHDL 2.4  --   VLDL 21  --   LDLCALC 34  --     HgbA1c:  Recent Labs  Lab 11/21/21 1105  HGBA1C 4.5*    Urine Drug Screen: No results for input(s): "LABOPIA", "COCAINSCRNUR", "LABBENZ", "AMPHETMU", "THCU", "LABBARB" in the last 168 hours.  Alcohol Level No results for input(s): "ETH" in the last 168 hours.  IMAGING past 24 hours No results found.  PHYSICAL EXAM General:  Drowsy, well-nourished, well-developed patient s/p removal of left eye in no acute distress Respiratory:  Regular, unlabored respirations  NEURO:  Patient AAO x 3, tired today but awakens to voice, orientated to age and place.  No aphasia, no significant dysarthria, follow simple commands.  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  Bilateral upper extremity 4/5, bilateral lower extremity 3/5.  Sensation symmetrical subjectively and finger-to-nose grossly intact.  Gait not tested   ASSESSMENT/PLAN [name]. is a 51 y.o. 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  He presented to the ED and was found to be hypertensive with SBP in the 200s.  Head CT revealed ICH in the fourth ventricle.  Eliquis was reversed with Andexxa. Nephrology and infectious disease on board. Dialysis TTS. Cleviprex off, bp well controlled with PO medications  today.   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 The pericardial effusion is circumferential. There is no evidence of cardiac tamponade LDL 34 HgbA1c 4.5 VTE prophylaxis - SCDs clopidogrel 75 mg daily and Eliquis (apixaban) daily prior to admission, now on No antithrombotic secondary to IVH Therapy recommendations:  CIR Disposition:  pending  Hypertensive emergency Home meds:  carvedilol 25 mg BID, amlodipine 5 mg daily, clonidine  0.1 mg TID PRN Now on amlodipine 10, Coreg 25 twice daily, clonidine 0.1->0.2->0.3 q8 hours Was on hydralazine 100 Q8h -> orthostasis 10/5 with therapy ->d/c hydralazine Keep SBP <160 Long-term BP goal normotensive  History of stroke and seizure 12/2020  admitted for syncope and unresponsiveness, CT negative 01/2021 admitted for transient slurred speech.  MRI showed left hippocampus punctate infarct.  MRA and carotid Doppler unremarkable, EEG no seizure.  LDL 38, A1c 5.7, EF 55 to 60%.  Discharged on Eliquis, Plavix and Lipitor 40 03/2021 admitted for left upper extremity weakness, CT negative.  CTA head and neck poor visualization but no significant finding.  MRI showed bilateral MCA/ACA watershed infarcts as well as left splenium small infarct.  EEG no seizure 06/2021 admitted for jerking movement for weeks.  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  MRI no acute infarct 07/2021 admitted for seizure-like activity in the setting of recent dialysis and glucose 77.  Long-term EEG monitoring no seizure.  Put on Keppra and discontinue Klonopin.  Fevers Persistent fevers Tmax 102->101.7->100.4->101.6->101.1-100.8-100.5 1 of 2 blood cultures growing MRSE, typically a contaminant, however given risk (HD catheter) of real infection and continue fevers, put on Vanc.  Repeat blood culture 4/4 neg ID consulted -> d/c vanco  Hyperlipidemia Home meds: atorvastatin 80 mg daily LDL 34, goal < 70 Hold off statin for now due to acute IVH Consider to resume statin at discharge  ESRD on HD Patient receives dialysis T, Th, Sat Appreciate nephrology input Continue dialysis on outpatient schedule  Large pericardial effusion Found on both CT chest and 2D echo Likely due to ESRD No tamponade on 2D echo Hemodynamically stable Close monitoring  Hiccup- Resolved Not able to give thorazine due to QT prolongation Not able to give baclofen due to ESRD Now on reglan Q8h PRN monitoring  Other Stroke Risk Factors PVD dCHF  Other Active Problems Nodules in left lung Chest CT shows ground glass nodules in bilateral lungs Recommend follow up chest CT every 6-12 months to monitor nodules  Hospital day # 7  Neurology will sign off.  Please call with questions or concerns.  Patient seen and examined by NP/APP with MD. MD to update note as needed.   Beulah Gandy DNP, ACNPC-AG  [facility]  ATTENDING NOTE: I reviewed above note and agree with the assessment and plan. Pt was seen and examined.   No acute event overnight, patient tolerating HD well today.  BP still on the higher end, continue BP meds and add hydralazine if needed.  Last time he had hydralazine but developed orthostatic hypotension.  Still has intermittent low-grade fever, blood culture  repeat negative.  Off vancomycin per ID.  PT/OT recommend CIR.  Neurology will sign off. Please call with questions. Pt will follow up with stroke clinic NP at [facility] in about 4 weeks. Thanks for the consult.  For detailed assessment and plan, please refer to above/below as I have made changes wherever appropriate.   Rosalin Hawking, MD PhD Stroke Neurology 11/28/2021 2:55 PM

## 2021-11-28 NOTE — Progress Notes (Signed)
Pt. Refused cpap. 

## 2021-11-28 NOTE — Plan of Care (Signed)
  Problem: Education: Goal: Knowledge of disease or condition will improve 11/28/2021 0110 by Zadie Rhine, RN Outcome: Progressing 11/28/2021 0109 by Zadie Rhine, RN Outcome: Progressing Goal: Knowledge of secondary prevention will improve (SELECT ALL) 11/28/2021 0110 by Zadie Rhine, RN Outcome: Progressing 11/28/2021 0109 by Zadie Rhine, RN Outcome: Progressing Goal: Knowledge of patient specific risk factors will improve (INDIVIDUALIZE FOR PATIENT) Outcome: Progressing

## 2021-11-29 DIAGNOSIS — I161 Hypertensive emergency: Secondary | ICD-10-CM | POA: Diagnosis not present

## 2021-11-29 DIAGNOSIS — N186 End stage renal disease: Secondary | ICD-10-CM | POA: Diagnosis not present

## 2021-11-29 DIAGNOSIS — L8990 Pressure ulcer of unspecified site, unspecified stage: Secondary | ICD-10-CM | POA: Insufficient documentation

## 2021-11-29 DIAGNOSIS — T827XXA Infection and inflammatory reaction due to other cardiac and vascular devices, implants and grafts, initial encounter: Secondary | ICD-10-CM | POA: Diagnosis not present

## 2021-11-29 DIAGNOSIS — R7881 Bacteremia: Secondary | ICD-10-CM | POA: Diagnosis not present

## 2021-11-29 LAB — CBC
HCT: 30.4 % — ABNORMAL LOW (ref 39.0–52.0)
Hemoglobin: 9.4 g/dL — ABNORMAL LOW (ref 13.0–17.0)
MCH: 22.8 pg — ABNORMAL LOW (ref 26.0–34.0)
MCHC: 30.9 g/dL (ref 30.0–36.0)
MCV: 73.8 fL — ABNORMAL LOW (ref 80.0–100.0)
Platelets: 216 10*3/uL (ref 150–400)
RBC: 4.12 MIL/uL — ABNORMAL LOW (ref 4.22–5.81)
RDW: 19.8 % — ABNORMAL HIGH (ref 11.5–15.5)
WBC: 7.7 10*3/uL (ref 4.0–10.5)
nRBC: 0 % (ref 0.0–0.2)

## 2021-11-29 LAB — GLUCOSE, CAPILLARY: Glucose-Capillary: 116 mg/dL — ABNORMAL HIGH (ref 70–99)

## 2021-11-29 NOTE — Progress Notes (Signed)
PROGRESS NOTE    Mel [name].  TTS:[id number] DOB: 1970-04-13 DOA: 11/21/2021 PCP: [name], MD   Brief Narrative:  [name]. is a 51 y.o. male with a history of stroke, atrial fibrillation, NSTEMI and CHF with chronic on Plavix and apixaban who has been having general malaise for the past few days.  Also a cough.  He has had some nausea and vomiting over the past 2 days, but only mild headache.  When he presented to the emergency department, his blood pressure was in the 200s.  Given the nausea and vomiting and severe hypertension, the ER physician at [facility] decided to perform a head CT which demonstrates a fourth ventricle bleed. Transitioned to [facility] care 11/27/21.  Assessment & Plan:   Principal Problem:   Bacteremia associated with intravascular line ([place]) Active Problems:   Intracranial bleed (HCC)   ICH (intracerebral hemorrhage) (HCC)   Hypertensive emergency   Pressure injury of skin  Interventricular hemorrhage - bilateral lateral ventricles/4th ventricle without hydrocephaly Complicated by uncontrolled hypertension Confirmed on imaging -  CTA head & neck No evidence of aneurysm, no LVO or high grade stenosis Follow up CT 10/1, 10/4 stable IVH 2D Echo EF 65 to 70%, large pericardial effusion - no evidence of tamponade LDL 34 HgbA1c 4.5 VTE prophylaxis - SCDs Previously on plavix/eliquis - on hold now given active IVH  Neuro signing off   Hypertensive emergency Home meds:  carvedilol 25 mg BID, amlodipine 5 mg daily, clonidine  0.1 mg TID PRN Currently on amlodipine 10, coreg 25, clonidine 0.3 Previously requiring Cleviprex - now off Keep SBP <160 - Long-term BP goal normotensive  History of seizure Continue keppra;   Fevers, ongoing Unclear etiology - ID following Hold all abx and follow clinically Cultures negative; MRSE considered contaminant Possibly secondary to IVH/reactive   Hyperlipidemia Home meds: atorvastatin 80 mg daily LDL  34, goal < 70 Resume statin at DC   ESRD on HD Patient receives dialysis T, Th, Sat Appreciate nephrology input Continue dialysis per nephrology   Large pericardial effusion Found on both CT chest and 2D echo Likely due to ESRD - hoping volume management will improve effusion No tamponade on 2D echo Hemodynamically stable Close monitoring   Hiccup- Resolved Not able to give thorazine due to QT prolongation Not able to give baclofen due to ESRD Now on reglan Q8h PRN    Incidental L lung nodule Recommend follow up chest CT every 6-12 months to monitor nodules  DVT prophylaxis: heparin Code Status: Full Family Communication: None present  Status is: INpt  Dispo: The patient is from: Home              Anticipated d/c is to: CIR per PT recs              Anticipated d/c date is: 24-48h              Patient currently NOT medically stable for discharge  Consultants:  Neuro, nephro  Procedures:  None  Antimicrobials:  Discontinued 10/6   Subjective: No acute issues/events overnight -episode of orthostatic hypotension today, continue to evaluate with physical therapy  Objective: Vitals:   11/28/21 2034 11/29/21 0104 11/29/21 0517 11/29/21 0743  BP: (!) 129/48 (!) 111/26 (!) 149/32 (!) 154/36  Pulse: 65 69 65 65  Resp: 18 19 18 20   Temp: 98.8 F (37.1 C) 100.2 F (37.9 C) 100.1 F (37.8 C) (!) 100.4 F (38 C)  TempSrc: Oral Oral Oral  Oral  SpO2: 97% 94% 100% 96%  Weight:      Height:        Intake/Output Summary (Last 24 hours) at 11/29/2021 0757 Last data filed at 11/28/2021 1904 Gross per 24 hour  Intake 237 ml  Output 1000 ml  Net -763 ml    Filed Weights   11/28/21 0027 11/28/21 0827 11/28/21 1243  Weight: 79.1 kg 79.2 kg 78.2 kg    Examination:  General exam: Appears calm and comfortable  Respiratory system: Clear to auscultation. Respiratory effort normal. Cardiovascular system: S1 & S2 heard, RRR. No JVD, murmurs, rubs, gallops or clicks. No  pedal edema. Gastrointestinal system: Abdomen is nondistended, soft and nontender. No organomegaly or masses felt. Normal bowel sounds heard. Central nervous system: Alert and oriented. No focal neurological deficits. Extremities: Symmetric 5 x 5 power. Skin: No rashes, lesions or ulcers Psychiatry: Judgement and insight appear normal. Mood & affect appropriate.     Data Reviewed: I have personally reviewed following labs and imaging studies  CBC: Recent Labs  Lab 11/25/21 0759 11/26/21 0327 11/27/21 0429 11/28/21 0334 11/29/21 0643  WBC 10.0 9.4 7.8 7.4 7.7  HGB 9.2* 9.1* 9.0* 8.8* 9.4*  HCT 29.6* 30.4* 30.2* 29.2* 30.4*  MCV 73.6* 73.4* 73.5* 73.7* 73.8*  PLT 125* 129* 159 200 427    Basic Metabolic Panel: Recent Labs  Lab 11/24/21 0758 11/25/21 0759 11/26/21 0327 11/27/21 0429  NA 136 135 132* 133*  K 4.5 3.8 4.0 3.9  CL 97* 94* 96* 93*  CO2 19* 22 19* 22  GLUCOSE 71 90 99 83  BUN 61* 31* 46* 46*  CREATININE 14.22* 8.71* 10.44* 10.28*  CALCIUM 8.3* 8.6* 7.8* 9.0  PHOS 9.0*  --   --   --     GFR: Estimated Creatinine Clearance: 9.2 mL/min (A) (by C-G formula based on SCr of 10.28 mg/dL (H)). Liver Function Tests: Recent Labs  Lab 11/24/21 0758  ALBUMIN 2.7*    No results for input(s): "LIPASE", "AMYLASE" in the last 168 hours. No results for input(s): "AMMONIA" in the last 168 hours. Coagulation Profile: No results for input(s): "INR", "PROTIME" in the last 168 hours.  Cardiac Enzymes: No results for input(s): "CKTOTAL", "CKMB", "CKMBINDEX", "TROPONINI" in the last 168 hours. BNP (last 3 results) No results for input(s): "PROBNP" in the last 8760 hours. HbA1C: No results for input(s): "HGBA1C" in the last 72 hours. CBG: No results for input(s): "GLUCAP" in the last 168 hours. Lipid Profile: No results for input(s): "CHOL", "HDL", "LDLCALC", "TRIG", "CHOLHDL", "LDLDIRECT" in the last 72 hours.  Thyroid Function Tests: No results for input(s):  "TSH", "T4TOTAL", "FREET4", "T3FREE", "THYROIDAB" in the last 72 hours. Anemia Panel: No results for input(s): "VITAMINB12", "FOLATE", "FERRITIN", "TIBC", "IRON", "RETICCTPCT" in the last 72 hours. Sepsis Labs: No results for input(s): "PROCALCITON", "LATICACIDVEN" in the last 168 hours.  Recent Results (from the past 240 hour(s))  Resp Panel by RT-PCR (Flu A&B, Covid) Anterior Nasal Swab     Status: None   Collection Time: 11/21/21  5:58 PM   Specimen: Anterior Nasal Swab  Result Value Ref Range Status   SARS Coronavirus 2 by RT PCR NEGATIVE NEGATIVE Final    Comment: (NOTE) SARS-CoV-2 target nucleic acids are NOT DETECTED.  The SARS-CoV-2 RNA is generally detectable in upper respiratory specimens during the acute phase of infection. The lowest concentration of SARS-CoV-2 viral copies this assay can detect is 138 copies/mL. A negative result does not preclude SARS-Cov-2 infection and should  not be used as the sole basis for treatment or other patient management decisions. A negative result may occur with  improper specimen collection/handling, submission of specimen other than nasopharyngeal swab, presence of viral mutation(s) within the areas targeted by this assay, and inadequate number of viral copies(<138 copies/mL). A negative result must be combined with clinical observations, patient history, and epidemiological information. The expected result is Negative.  Fact Sheet for Patients:  EntrepreneurPulse.com.au  Fact Sheet for Healthcare Providers:  IncredibleEmployment.be  This test is no t yet approved or cleared by the [place] FDA and  has been authorized for detection and/or diagnosis of SARS-CoV-2 by FDA under an Emergency Use Authorization (EUA). This EUA will remain  in effect (meaning this test can be used) for the duration of the COVID-19 declaration under Section 564(b)(1) of the Act, 21 U.S.C.section 360bbb-3(b)(1),  unless the authorization is terminated  or revoked sooner.       Influenza A by PCR NEGATIVE NEGATIVE Final   Influenza B by PCR NEGATIVE NEGATIVE Final    Comment: (NOTE) The Xpert Xpress SARS-CoV-2/FLU/RSV plus assay is intended as an aid in the diagnosis of influenza from Nasopharyngeal swab specimens and should not be used as a sole basis for treatment. Nasal washings and aspirates are unacceptable for Xpert Xpress SARS-CoV-2/FLU/RSV testing.  Fact Sheet for Patients: EntrepreneurPulse.com.au  Fact Sheet for Healthcare Providers: IncredibleEmployment.be  This test is not yet approved or cleared by the [place] FDA and has been authorized for detection and/or diagnosis of SARS-CoV-2 by FDA under an Emergency Use Authorization (EUA). This EUA will remain in effect (meaning this test can be used) for the duration of the COVID-19 declaration under Section 564(b)(1) of the Act, 21 U.S.C. section 360bbb-3(b)(1), unless the authorization is terminated or revoked.  Performed at [facility], [address], [place], [place] [address]   MRSA Next Gen by PCR, Nasal     Status: None   Collection Time: 11/21/21 11:28 PM   Specimen: Nasal Mucosa; Nasal Swab  Result Value Ref Range Status   MRSA by PCR Next Gen NOT DETECTED NOT DETECTED Final    Comment: (NOTE) The GeneXpert MRSA Assay (FDA approved for NASAL specimens only), is one component of a comprehensive MRSA colonization surveillance program. It is not intended to diagnose MRSA infection nor to guide or monitor treatment for MRSA infections. Test performance is not FDA approved in patients less than 30 years old. Performed at [facility], [place] [address], [place], [place] [address]   Culture, blood (Routine X 2) w Reflex to ID Panel     Status: Abnormal   Collection Time: 11/22/21 12:54 AM   Specimen: BLOOD LEFT ARM  Result Value Ref Range Status   Specimen Description BLOOD  LEFT ARM  Final   Special Requests   Final    BOTTLES DRAWN AEROBIC AND ANAEROBIC Blood Culture results may not be optimal due to an inadequate volume of blood received in culture bottles   Culture  Setup Time   Final    GRAM POSITIVE COCCI ANAEROBIC BOTTLE ONLY CRITICAL RESULT CALLED TO, READ BACK BY AND VERIFIED WITH: V BRYK,PHARMD@[id number]  11/23/21 [place]    Culture (A)  Final    STAPHYLOCOCCUS EPIDERMIDIS THE SIGNIFICANCE OF ISOLATING THIS ORGANISM FROM A SINGLE SET OF BLOOD CULTURES WHEN MULTIPLE SETS ARE DRAWN IS UNCERTAIN. PLEASE NOTIFY THE MICROBIOLOGY DEPARTMENT WITHIN ONE WEEK IF SPECIATION AND SENSITIVITIES ARE REQUIRED. Performed at [facility], [place] [address],  [place], [place] [address]    Report Status 11/25/2021 FINAL  Final  Blood Culture ID Panel (Reflexed)     Status: Abnormal   Collection Time: 11/22/21 12:54 AM  Result Value Ref Range Status   Enterococcus faecalis NOT DETECTED NOT DETECTED Final   Enterococcus Faecium NOT DETECTED NOT DETECTED Final   Listeria monocytogenes NOT DETECTED NOT DETECTED Final   Staphylococcus species DETECTED (A) NOT DETECTED Final    Comment: CRITICAL RESULT CALLED TO, READ BACK BY AND VERIFIED WITH: V BRYK,PHARMD@0530  11/23/21 [place]    Staphylococcus aureus (BCID) NOT DETECTED NOT DETECTED Final   Staphylococcus epidermidis DETECTED (A) NOT DETECTED Final    Comment: Methicillin (oxacillin) resistant coagulase negative staphylococcus. Possible blood culture contaminant (unless isolated from more than one blood culture draw or clinical case suggests pathogenicity). No antibiotic treatment is indicated for blood  culture contaminants. CRITICAL RESULT CALLED TO, READ BACK BY AND VERIFIED WITH: V BRYK,PHARMD@0533  11/23/21 [place]    Staphylococcus lugdunensis NOT DETECTED NOT DETECTED Final   Streptococcus species NOT DETECTED NOT DETECTED Final   Streptococcus agalactiae NOT DETECTED NOT DETECTED Final   Streptococcus pneumoniae NOT DETECTED  NOT DETECTED Final   Streptococcus pyogenes NOT DETECTED NOT DETECTED Final   A.calcoaceticus-baumannii NOT DETECTED NOT DETECTED Final   Bacteroides fragilis NOT DETECTED NOT DETECTED Final   Enterobacterales NOT DETECTED NOT DETECTED Final   Enterobacter cloacae complex NOT DETECTED NOT DETECTED Final   Escherichia coli NOT DETECTED NOT DETECTED Final   Klebsiella aerogenes NOT DETECTED NOT DETECTED Final   Klebsiella oxytoca NOT DETECTED NOT DETECTED Final   Klebsiella pneumoniae NOT DETECTED NOT DETECTED Final   Proteus species NOT DETECTED NOT DETECTED Final   Salmonella species NOT DETECTED NOT DETECTED Final   Serratia marcescens NOT DETECTED NOT DETECTED Final   Haemophilus influenzae NOT DETECTED NOT DETECTED Final   Neisseria meningitidis NOT DETECTED NOT DETECTED Final   Pseudomonas aeruginosa NOT DETECTED NOT DETECTED Final   Stenotrophomonas maltophilia NOT DETECTED NOT DETECTED Final   Candida albicans NOT DETECTED NOT DETECTED Final   Candida auris NOT DETECTED NOT DETECTED Final   Candida glabrata NOT DETECTED NOT DETECTED Final   Candida krusei NOT DETECTED NOT DETECTED Final   Candida parapsilosis NOT DETECTED NOT DETECTED Final   Candida tropicalis NOT DETECTED NOT DETECTED Final   Cryptococcus neoformans/gattii NOT DETECTED NOT DETECTED Final   Methicillin resistance mecA/C DETECTED (A) NOT DETECTED Final    Comment: CRITICAL RESULT CALLED TO, READ BACK BY AND VERIFIED WITH: V BRYK,PHARMD@0532  11/23/21 [place] Performed at [facility], [address] [place], [place] [address]   Culture, blood (Routine X 2) w Reflex to ID Panel     Status: None   Collection Time: 11/23/21 10:52 AM   Specimen: BLOOD  Result Value Ref Range Status   Specimen Description BLOOD BLOOD RIGHT HAND  Final   Special Requests   Final    BOTTLES DRAWN AEROBIC AND ANAEROBIC Blood Culture adequate volume   Culture   Final    NO GROWTH 5 DAYS Performed at [facility], [place] [address] [place], [place] [address]    Report Status 11/28/2021 FINAL  Final  Culture, blood (Routine X 2) w Reflex to ID Panel     Status: None   Collection Time: 11/23/21 10:53 AM   Specimen: BLOOD  Result Value Ref Range Status   Specimen Description BLOOD SITE NOT SPECIFIED  Final   Special Requests   Final  BOTTLES DRAWN AEROBIC AND ANAEROBIC Blood Culture adequate volume   Culture   Final    NO GROWTH 5 DAYS Performed at [facility], [place] [address], [place], [place] [address]    Report Status 11/28/2021 FINAL  Final         Radiology Studies: No results found.      Scheduled Meds:  amiodarone  200 mg Oral Daily   amLODipine  10 mg Oral Daily   calcium acetate  1,334 mg Oral TID WC   carvedilol  25 mg Oral BID   Chlorhexidine Gluconate Cloth  6 each Topical Daily   cloNIDine  0.3 mg Oral Q8H   darbepoetin (ARANESP) injection - DIALYSIS  150 mcg Intravenous Q Tue-HD   dorzolamide-timolol  1 drop Right Eye BID   heparin injection (subcutaneous)  5,000 Units Subcutaneous Q8H   irbesartan  300 mg Oral Daily   levETIRAcetam  250 mg Oral Q T,Th,Sa-HD   levETIRAcetam  500 mg Oral BID   pantoprazole  40 mg Oral Daily   pneumococcal 20-valent conjugate vaccine  0.5 mL Intramuscular Tomorrow-1000   prednisoLONE acetate  1 drop Left Eye Q4H while awake   senna-docusate  1 tablet Oral BID   tamsulosin  0.4 mg Oral Daily   Continuous Infusions:     LOS: 8 days   Time spent: 26min  Sheron Robin C Jaquon Gingerich, DO [facility]  If 7PM-7AM, please contact night-coverage [email or web address]  11/29/2021, 7:57 AM

## 2021-11-29 NOTE — Progress Notes (Signed)
Patient ID: [name]., male   DOB: Aug 09, 1970, 51 y.o.   MRN: [id number] [facility] Progress Note   Assessment/ Plan:   1.  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  Antithrombotic therapy contraindicated with ICH. 2. ESRD: We will continue TTS schedule via right IJ TDC; next dialysis due 10/10.  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 3. Anemia: Hgb currently lower without any overt losses, begin ESA.   4. CKD-MBD: Calcium level acceptable, will monitor phosphorus trends. 5. Nutrition: Continue current renal diet/fluid restriction with monitoring albumin levels to decide on need for nutritional supplementation. 6. Hypertension: Blood pressure improving on amlodipine, carvedilol, irbesartan and clonidine.  Subjective:   Underwent dialysis yesterday without problems, anticipating admission to CIR.   Objective:   BP (!) 154/36 (BP Location: Right Leg)   Pulse 65   Temp (!) 100.4 F (38 C) (Oral) Comment: RN Notified  Resp 20   Ht 6\' 3"  (1.905 m)   Wt 78.2 kg   SpO2 96%   BMI 21.55 kg/m   Physical Exam: Gen: Comfortably resting in bed, wife at bedside CVS: Pulse regular rhythm, normal rate, S1 and S2 normal Resp: Clear to auscultation bilaterally, no distinct rales or rhonchi.  Right IJ TDC Abd: Soft, flat, nontender, bowel sounds normal Ext: No lower extremity edema.  Status post left foot hallux amputation.  Left brachiocephalic fistula with poor augmentation and thrill in outflow.  Labs: BMET Recent Labs  Lab 11/24/21 0758 11/25/21 0759 11/26/21 0327 11/27/21 0429  NA 136 135 132* 133*  K 4.5 3.8 4.0 3.9  CL 97* 94* 96* 93*  CO2 19* 22 19* 22  GLUCOSE 71 90 99 83  BUN 61* 31* 46* 46*  CREATININE 14.22* 8.71* 10.44* 10.28*  CALCIUM 8.3* 8.6* 7.8* 9.0   PHOS 9.0*  --   --   --    CBC Recent Labs  Lab 11/26/21 0327 11/27/21 0429 11/28/21 0334 11/29/21 0643  WBC 9.4 7.8 7.4 7.7  HGB 9.1* 9.0* 8.8* 9.4*  HCT 30.4* 30.2* 29.2* 30.4*  MCV 73.4* 73.5* 73.7* 73.8*  PLT 129* 159 200 216     Medications:     amiodarone  200 mg Oral Daily   amLODipine  10 mg Oral Daily   calcium acetate  1,334 mg Oral TID WC   carvedilol  25 mg Oral BID   Chlorhexidine Gluconate Cloth  6 each Topical Daily   cloNIDine  0.3 mg Oral Q8H   darbepoetin (ARANESP) injection - DIALYSIS  150 mcg Intravenous Q Tue-HD   dorzolamide-timolol  1 drop Right Eye BID   heparin injection (subcutaneous)  5,000 Units Subcutaneous Q8H   irbesartan  300 mg Oral Daily   levETIRAcetam  250 mg Oral Q T,Th,Sa-HD   levETIRAcetam  500 mg Oral BID   pantoprazole  40 mg Oral Daily   pneumococcal 20-valent conjugate vaccine  0.5 mL Intramuscular Tomorrow-1000   prednisoLONE acetate  1 drop Left Eye Q4H while awake   senna-docusate  1 tablet Oral BID   tamsulosin  0.4 mg Oral Daily   Elmarie Shiley, MD 11/29/2021, 9:49 AM

## 2021-11-29 NOTE — Progress Notes (Signed)
Physical Therapy Treatment Patient Details Name: [name]. MRN: [id number] DOB: 16-Mar-1970 Today's Date: 11/29/2021   History of Present Illness 51 yo male admitted 9/30 ICH 4th ventricle and lateral ventricles.  PMH includes: ESRD on HD TTS, HTN, amputation L big toe, L eye absent, CVA, afib on plavix and Eliquis, Glaucoma, seizure, PVD, and CHF.    PT Comments    Pt required mod assist supine to sit, mod assist sit to stand from elevated surface, and max assist sit to supine. He demonstrated fair sitting balance and poor standing balance. Poor activity tolerance resulting in inability to transfer to recliner after standing trial. Bed placed in chair position with pillows under BUE at end of session.    Recommendations for follow up therapy are one component of a multi-disciplinary discharge planning process, led by the attending physician.  Recommendations may be updated based on patient status, additional functional criteria and insurance authorization.  Follow Up Recommendations  Acute inpatient rehab (3hours/day)     Assistance Recommended at Discharge Frequent or constant Supervision/Assistance  Patient can return home with the following Two people to help with walking and/or transfers;Direct supervision/assist for medications management;Assist for transportation;Assistance with cooking/housework;Direct supervision/assist for financial management;A lot of help with bathing/dressing/bathroom   Equipment Recommendations  Other (comment) (defer to post acute)    Recommendations for Other Services       Precautions / Restrictions Precautions Precautions: Fall;Other (comment) Precaution Comments: seizure, SBP 130-150; HD port R chest, HD fistulas BUE     Mobility  Bed Mobility Overal bed mobility: Needs Assistance Bed Mobility: Supine to Sit, Sit to Supine     Supine to sit: HOB elevated, Mod assist Sit to supine: Max assist   General bed mobility comments:  increased time, cues for sequencing, assist with BLE and trunk. Fatigued with standing attempt resulting in need for increased assist for back to bed.    Transfers Overall transfer level: Needs assistance Equipment used: Rolling walker (2 wheels) Transfers: Sit to/from Stand Sit to Stand: Mod assist, From elevated surface           General transfer comment: cues for hand placement and sequencing, assist to power up and stabilize balance, increased time. Pt sat impulsively. Fatigued and unable to attempt pivot to recliner.    Ambulation/Gait                   Stairs             Wheelchair Mobility    Modified Rankin (Stroke Patients Only) Modified Rankin (Stroke Patients Only) Pre-Morbid Rankin Score: Moderate disability Modified Rankin: Severe disability     Balance Overall balance assessment: Needs assistance Sitting-balance support: Feet supported Sitting balance-Leahy Scale: Fair     Standing balance support: During functional activity, Bilateral upper extremity supported, Reliant on assistive device for balance Standing balance-Leahy Scale: Poor Standing balance comment: retropulsive requiring mod assist to maintain standing balance                            Cognition Arousal/Alertness: Awake/alert Behavior During Therapy: Flat affect Overall Cognitive Status: Impaired/Different from baseline Area of Impairment: Attention, Following commands, Memory, Safety/judgement, Awareness, Problem solving                 Orientation Level: Disoriented to, Place, Time, Situation Current Attention Level: Sustained Memory: Decreased recall of precautions, Decreased short-term memory Following Commands: Follows one step commands inconsistently, Follows one step  commands with increased time Safety/Judgement: Decreased awareness of safety, Decreased awareness of deficits Awareness: Emergent Problem Solving: Slow processing, Decreased  initiation, Difficulty sequencing, Requires verbal cues General Comments: Slow to respond. Easily agitated.        Exercises      General Comments General comments (skin integrity, edema, etc.): Pt fatigued, with decreased responsiveness, after standing trial. Assisted back to bed. BP stable.      Pertinent Vitals/Pain Pain Assessment Pain Assessment: Faces Faces Pain Scale: Hurts even more Pain Location: back/bottom with mobility Pain Descriptors / Indicators: Discomfort, Grimacing, Moaning Pain Intervention(s): Limited activity within patient's tolerance, Repositioned, Monitored during session    Home Living                          Prior Function            PT Goals (current goals can now be found in the care plan section) Acute Rehab PT Goals Patient Stated Goal: not stated Progress towards PT goals: Progressing toward goals    Frequency    Min 4X/week      PT Plan Current plan remains appropriate    Co-evaluation              AM-PAC PT "6 Clicks" Mobility   Outcome Measure  Help needed turning from your back to your side while in a flat bed without using bedrails?: A Lot Help needed moving from lying on your back to sitting on the side of a flat bed without using bedrails?: A Lot Help needed moving to and from a bed to a chair (including a wheelchair)?: A Lot Help needed standing up from a chair using your arms (e.g., wheelchair or bedside chair)?: A Lot Help needed to walk in hospital room?: Total Help needed climbing 3-5 steps with a railing? : Total 6 Click Score: 10    End of Session Equipment Utilized During Treatment: Gait belt Activity Tolerance: Patient limited by fatigue Patient left: in bed;with call bell/phone within reach;with bed alarm set;with family/visitor present Nurse Communication: Mobility status PT Visit Diagnosis: Other abnormalities of gait and mobility (R26.89);Other symptoms and signs involving the nervous  system (R29.898);Muscle weakness (generalized) (M62.81)     Time: 8250-5397 PT Time Calculation (min) (ACUTE ONLY): 23 min  Charges:  $Therapeutic Activity: 23-37 mins                     Lorrin Goodell, PT  Office # [phone] Pager [phone]    Lorriane Shire 11/29/2021, 12:12 PM

## 2021-11-30 DIAGNOSIS — T827XXA Infection and inflammatory reaction due to other cardiac and vascular devices, implants and grafts, initial encounter: Secondary | ICD-10-CM | POA: Diagnosis not present

## 2021-11-30 DIAGNOSIS — N186 End stage renal disease: Secondary | ICD-10-CM | POA: Diagnosis not present

## 2021-11-30 DIAGNOSIS — I615 Nontraumatic intracerebral hemorrhage, intraventricular: Secondary | ICD-10-CM | POA: Diagnosis not present

## 2021-11-30 DIAGNOSIS — I161 Hypertensive emergency: Secondary | ICD-10-CM | POA: Diagnosis not present

## 2021-11-30 NOTE — Progress Notes (Signed)
Inpatient Rehab Admissions Coordinator:    Met with patient at bedside he is agreeable to pursue CIR admission. Lives with his wife who has spoken with my co-worker and also wanted CIR over SNF. Insurance has been opened for potential admission. Will continue to follow.   Rehab Admissons Coordinator [place], [place], [place] [phone]

## 2021-11-30 NOTE — Progress Notes (Signed)
Pt continues to refuse cpap.  Order will now be d/c'd.  RT will continue to monitor.

## 2021-11-30 NOTE — Progress Notes (Signed)
PROGRESS NOTE    [name].  FAO:[id number] DOB: 01/21/1971 DOA: 11/21/2021 PCP: [name], MD   Brief Narrative:  [name] Spittler. is a 51 y.o. male with a history of stroke, atrial fibrillation, NSTEMI and CHF with chronic on Plavix and apixaban who has been having general malaise for the past few days.  Also a cough.  He has had some nausea and vomiting over the past 2 days, but only mild headache.  When he presented to the emergency department, his blood pressure was in the 200s.  Given the nausea and vomiting and severe hypertension, the ER physician at [facility] decided to perform a head CT which demonstrates a fourth ventricle bleed. Transitioned to [facility] care 11/27/21.  Assessment & Plan:   Principal Problem:   Bacteremia associated with intravascular line ([place]) Active Problems:   Intracranial bleed (HCC)   ICH (intracerebral hemorrhage) (HCC)   Hypertensive emergency   Pressure injury of skin  Interventricular hemorrhage - bilateral lateral ventricles/4th ventricle without hydrocephaly Complicated by uncontrolled hypertension Confirmed on imaging -  CTA head & neck No evidence of aneurysm, no LVO or high grade stenosis Follow up CT 10/1, 10/4 stable IVH 2D Echo EF 65 to 70%, large pericardial effusion - no evidence of tamponade LDL 34 HgbA1c 4.5 VTE prophylaxis - SCDs Previously on plavix/eliquis - on hold now given active IVH  Neuro signing off  Hypertensive emergency Home meds:  carvedilol 25 mg BID, amlodipine 5 mg daily, clonidine  0.1 mg TID PRN Currently on amlodipine 10, coreg 25, clonidine 0.3 Previously requiring Cleviprex - now off Keep SBP <160 - Long-term BP goal normotensive  History of seizure Continue keppra;   Fevers, resolving Unclear etiology - ID following Hold all abx and follow clinically Cultures negative; MRSE considered contaminant Possibly secondary to IVH/reactive  Hyperlipidemia Home meds: atorvastatin 80 mg daily LDL  34, goal < 70 Resume statin at DC   ESRD on HD Patient receives dialysis T, Th, Sat Appreciate nephrology input Continue dialysis per nephrology   Large pericardial effusion Found on both CT chest and 2D echo Likely due to ESRD - hoping volume management will improve effusion No tamponade on 2D echo Hemodynamically stable Close monitoring   Hiccup- Resolved Not able to give thorazine due to QT prolongation Not able to give baclofen due to ESRD Now on reglan Q8h PRN    Incidental L lung nodule Recommend follow up chest CT every 6-12 months to monitor nodules  DVT prophylaxis: heparin Code Status: Full Family Communication: None present  Status is: INpt  Dispo: The patient is from: Home              Anticipated d/c is to: CIR per PT recs              Anticipated d/c date is: 24-48h              Patient currently NOT medically stable for discharge  Consultants:  Neuro, nephro  Procedures:  None  Antimicrobials:  Discontinued 10/6   Subjective: No acute issues/events overnight -episode of orthostatic hypotension today, continue to evaluate with physical therapy  Objective: Vitals:   11/29/21 1630 11/29/21 1944 11/30/21 0009 11/30/21 0500  BP: 122/70 (!) 159/73 (!) 151/83 (!) 158/75  Pulse: (!) 58 (!) 55 (!) 58 (!) 59  Resp: 20 14 14 16   Temp: 98.5 F (36.9 C) 98 F (36.7 C) 97.7 F (36.5 C) 98.4 F (36.9 C)  TempSrc: Oral Oral Oral  Axillary  SpO2: 93% 100% 98% 99%  Weight:      Height:       No intake or output data in the 24 hours ending 11/30/21 0659  Filed Weights   11/28/21 0027 11/28/21 0827 11/28/21 1243  Weight: 79.1 kg 79.2 kg 78.2 kg    Examination:  General exam: Appears calm and comfortable  Respiratory system: Clear to auscultation. Respiratory effort normal. Cardiovascular system: S1 & S2 heard, RRR. No JVD, murmurs, rubs, gallops or clicks. No pedal edema. Gastrointestinal system: Abdomen is nondistended, soft and nontender. No  organomegaly or masses felt. Normal bowel sounds heard. Central nervous system: Alert and oriented. No focal neurological deficits. Extremities: Symmetric 5 x 5 power. Skin: No rashes, lesions or ulcers Psychiatry: Judgement and insight appear normal. Mood & affect appropriate.     Data Reviewed: I have personally reviewed following labs and imaging studies  CBC: Recent Labs  Lab 11/25/21 0759 11/26/21 0327 11/27/21 0429 11/28/21 0334 11/29/21 0643  WBC 10.0 9.4 7.8 7.4 7.7  HGB 9.2* 9.1* 9.0* 8.8* 9.4*  HCT 29.6* 30.4* 30.2* 29.2* 30.4*  MCV 73.6* 73.4* 73.5* 73.7* 73.8*  PLT 125* 129* 159 200 160    Basic Metabolic Panel: Recent Labs  Lab 11/24/21 0758 11/25/21 0759 11/26/21 0327 11/27/21 0429  NA 136 135 132* 133*  K 4.5 3.8 4.0 3.9  CL 97* 94* 96* 93*  CO2 19* 22 19* 22  GLUCOSE 71 90 99 83  BUN 61* 31* 46* 46*  CREATININE 14.22* 8.71* 10.44* 10.28*  CALCIUM 8.3* 8.6* 7.8* 9.0  PHOS 9.0*  --   --   --     GFR: Estimated Creatinine Clearance: 9.2 mL/min (A) (by C-G formula based on SCr of 10.28 mg/dL (H)). Liver Function Tests: Recent Labs  Lab 11/24/21 0758  ALBUMIN 2.7*    No results for input(s): "LIPASE", "AMYLASE" in the last 168 hours. No results for input(s): "AMMONIA" in the last 168 hours. Coagulation Profile: No results for input(s): "INR", "PROTIME" in the last 168 hours.  Cardiac Enzymes: No results for input(s): "CKTOTAL", "CKMB", "CKMBINDEX", "TROPONINI" in the last 168 hours. BNP (last 3 results) No results for input(s): "PROBNP" in the last 8760 hours. HbA1C: No results for input(s): "HGBA1C" in the last 72 hours. CBG: Recent Labs  Lab 11/29/21 1003  GLUCAP 116*   Lipid Profile: No results for input(s): "CHOL", "HDL", "LDLCALC", "TRIG", "CHOLHDL", "LDLDIRECT" in the last 72 hours.  Thyroid Function Tests: No results for input(s): "TSH", "T4TOTAL", "FREET4", "T3FREE", "THYROIDAB" in the last 72 hours. Anemia Panel: No  results for input(s): "VITAMINB12", "FOLATE", "FERRITIN", "TIBC", "IRON", "RETICCTPCT" in the last 72 hours. Sepsis Labs: No results for input(s): "PROCALCITON", "LATICACIDVEN" in the last 168 hours.  Recent Results (from the past 240 hour(s))  Resp Panel by RT-PCR (Flu A&B, Covid) Anterior Nasal Swab     Status: None   Collection Time: 11/21/21  5:58 PM   Specimen: Anterior Nasal Swab  Result Value Ref Range Status   SARS Coronavirus 2 by RT PCR NEGATIVE NEGATIVE Final    Comment: (NOTE) SARS-CoV-2 target nucleic acids are NOT DETECTED.  The SARS-CoV-2 RNA is generally detectable in upper respiratory specimens during the acute phase of infection. The lowest concentration of SARS-CoV-2 viral copies this assay can detect is 138 copies/mL. A negative result does not preclude SARS-Cov-2 infection and should not be used as the sole basis for treatment or other patient management decisions. A negative result may occur with  improper specimen collection/handling, submission of specimen other than nasopharyngeal swab, presence of viral mutation(s) within the areas targeted by this assay, and inadequate number of viral copies(<138 copies/mL). A negative result must be combined with clinical observations, patient history, and epidemiological information. The expected result is Negative.  Fact Sheet for Patients:  EntrepreneurPulse.com.au  Fact Sheet for Healthcare Providers:  IncredibleEmployment.be  This test is no t yet approved or cleared by the [place] FDA and  has been authorized for detection and/or diagnosis of SARS-CoV-2 by FDA under an Emergency Use Authorization (EUA). This EUA will remain  in effect (meaning this test can be used) for the duration of the COVID-19 declaration under Section 564(b)(1) of the Act, 21 U.S.C.section 360bbb-3(b)(1), unless the authorization is terminated  or revoked sooner.       Influenza A by PCR  NEGATIVE NEGATIVE Final   Influenza B by PCR NEGATIVE NEGATIVE Final    Comment: (NOTE) The Xpert Xpress SARS-CoV-2/FLU/RSV plus assay is intended as an aid in the diagnosis of influenza from Nasopharyngeal swab specimens and should not be used as a sole basis for treatment. Nasal washings and aspirates are unacceptable for Xpert Xpress SARS-CoV-2/FLU/RSV testing.  Fact Sheet for Patients: EntrepreneurPulse.com.au  Fact Sheet for Healthcare Providers: IncredibleEmployment.be  This test is not yet approved or cleared by the [place] FDA and has been authorized for detection and/or diagnosis of SARS-CoV-2 by FDA under an Emergency Use Authorization (EUA). This EUA will remain in effect (meaning this test can be used) for the duration of the COVID-19 declaration under Section 564(b)(1) of the Act, 21 U.S.C. section 360bbb-3(b)(1), unless the authorization is terminated or revoked.  Performed at [facility], [address], [place], [place] [address]   MRSA Next Gen by PCR, Nasal     Status: None   Collection Time: 11/21/21 11:28 PM   Specimen: Nasal Mucosa; Nasal Swab  Result Value Ref Range Status   MRSA by PCR Next Gen NOT DETECTED NOT DETECTED Final    Comment: (NOTE) The GeneXpert MRSA Assay (FDA approved for NASAL specimens only), is one component of a comprehensive MRSA colonization surveillance program. It is not intended to diagnose MRSA infection nor to guide or monitor treatment for MRSA infections. Test performance is not FDA approved in patients less than 67 years old. Performed at [facility], [place] [address], [place], [place] [address]   Culture, blood (Routine X 2) w Reflex to ID Panel     Status: Abnormal   Collection Time: 11/22/21 12:54 AM   Specimen: BLOOD LEFT ARM  Result Value Ref Range Status   Specimen Description BLOOD LEFT ARM  Final   Special Requests   Final    BOTTLES DRAWN AEROBIC AND ANAEROBIC Blood  Culture results may not be optimal due to an inadequate volume of blood received in culture bottles   Culture  Setup Time   Final    GRAM POSITIVE COCCI ANAEROBIC BOTTLE ONLY CRITICAL RESULT CALLED TO, READ BACK BY AND VERIFIED WITH: V BRYK,PHARMD@0531  11/23/21 [place]    Culture (A)  Final    STAPHYLOCOCCUS EPIDERMIDIS THE SIGNIFICANCE OF ISOLATING THIS ORGANISM FROM A SINGLE SET OF BLOOD CULTURES WHEN MULTIPLE SETS ARE DRAWN IS UNCERTAIN. PLEASE NOTIFY THE MICROBIOLOGY DEPARTMENT WITHIN ONE WEEK IF SPECIATION AND SENSITIVITIES ARE REQUIRED. Performed at [facility], [place] [address], [place],  [address]    Report Status 11/25/2021 FINAL  Final  Blood Culture ID Panel (Reflexed)  Status: Abnormal   Collection Time: 11/22/21 12:54 AM  Result Value Ref Range Status   Enterococcus faecalis NOT DETECTED NOT DETECTED Final   Enterococcus Faecium NOT DETECTED NOT DETECTED Final   Listeria monocytogenes NOT DETECTED NOT DETECTED Final   Staphylococcus species DETECTED (A) NOT DETECTED Final    Comment: CRITICAL RESULT CALLED TO, READ BACK BY AND VERIFIED WITH: V BRYK,PHARMD@0530  11/23/21 [place]    Staphylococcus aureus (BCID) NOT DETECTED NOT DETECTED Final   Staphylococcus epidermidis DETECTED (A) NOT DETECTED Final    Comment: Methicillin (oxacillin) resistant coagulase negative staphylococcus. Possible blood culture contaminant (unless isolated from more than one blood culture draw or clinical case suggests pathogenicity). No antibiotic treatment is indicated for blood  culture contaminants. CRITICAL RESULT CALLED TO, READ BACK BY AND VERIFIED WITH: V BRYK,PHARMD@0533  11/23/21 [place]    Staphylococcus lugdunensis NOT DETECTED NOT DETECTED Final   Streptococcus species NOT DETECTED NOT DETECTED Final   Streptococcus agalactiae NOT DETECTED NOT DETECTED Final   Streptococcus pneumoniae NOT DETECTED NOT DETECTED Final   Streptococcus pyogenes NOT DETECTED NOT DETECTED Final    A.calcoaceticus-baumannii NOT DETECTED NOT DETECTED Final   Bacteroides fragilis NOT DETECTED NOT DETECTED Final   Enterobacterales NOT DETECTED NOT DETECTED Final   Enterobacter cloacae complex NOT DETECTED NOT DETECTED Final   Escherichia coli NOT DETECTED NOT DETECTED Final   Klebsiella aerogenes NOT DETECTED NOT DETECTED Final   Klebsiella oxytoca NOT DETECTED NOT DETECTED Final   Klebsiella pneumoniae NOT DETECTED NOT DETECTED Final   Proteus species NOT DETECTED NOT DETECTED Final   Salmonella species NOT DETECTED NOT DETECTED Final   Serratia marcescens NOT DETECTED NOT DETECTED Final   Haemophilus influenzae NOT DETECTED NOT DETECTED Final   Neisseria meningitidis NOT DETECTED NOT DETECTED Final   Pseudomonas aeruginosa NOT DETECTED NOT DETECTED Final   Stenotrophomonas maltophilia NOT DETECTED NOT DETECTED Final   Candida albicans NOT DETECTED NOT DETECTED Final   Candida auris NOT DETECTED NOT DETECTED Final   Candida glabrata NOT DETECTED NOT DETECTED Final   Candida krusei NOT DETECTED NOT DETECTED Final   Candida parapsilosis NOT DETECTED NOT DETECTED Final   Candida tropicalis NOT DETECTED NOT DETECTED Final   Cryptococcus neoformans/gattii NOT DETECTED NOT DETECTED Final   Methicillin resistance mecA/C DETECTED (A) NOT DETECTED Final    Comment: CRITICAL RESULT CALLED TO, READ BACK BY AND VERIFIED WITH: V BRYK,PHARMD@0532  11/23/21 [place] Performed at [facility], [address], [place], [place] [address]   Culture, blood (Routine X 2) w Reflex to ID Panel     Status: None   Collection Time: 11/23/21 10:52 AM   Specimen: BLOOD  Result Value Ref Range Status   Specimen Description BLOOD BLOOD RIGHT HAND  Final   Special Requests   Final    BOTTLES DRAWN AEROBIC AND ANAEROBIC Blood Culture adequate volume   Culture   Final    NO GROWTH 5 DAYS Performed at [facility], [place] [address] [place], [place] [address]    Report Status 11/28/2021 FINAL  Final   Culture, blood (Routine X 2) w Reflex to ID Panel     Status: None   Collection Time: 11/23/21 10:53 AM   Specimen: BLOOD  Result Value Ref Range Status   Specimen Description BLOOD SITE NOT SPECIFIED  Final   Special Requests   Final    BOTTLES DRAWN AEROBIC AND ANAEROBIC Blood Culture adequate volume   Culture   Final    NO GROWTH  5 DAYS Performed at [facility], [place] [address] [place], [place] [address]    Report Status 11/28/2021 FINAL  Final         Radiology Studies: No results found.      Scheduled Meds:  amiodarone  200 mg Oral Daily   amLODipine  10 mg Oral Daily   calcium acetate  1,334 mg Oral TID WC   carvedilol  25 mg Oral BID   Chlorhexidine Gluconate Cloth  6 each Topical Daily   cloNIDine  0.3 mg Oral Q8H   darbepoetin (ARANESP) injection - DIALYSIS  150 mcg Intravenous Q Tue-HD   dorzolamide-timolol  1 drop Right Eye BID   heparin injection (subcutaneous)  5,000 Units Subcutaneous Q8H   irbesartan  300 mg Oral Daily   levETIRAcetam  250 mg Oral Q T,Th,Sa-HD   levETIRAcetam  500 mg Oral BID   pantoprazole  40 mg Oral Daily   pneumococcal 20-valent conjugate vaccine  0.5 mL Intramuscular Tomorrow-1000   prednisoLONE acetate  1 drop Left Eye Q4H while awake   senna-docusate  1 tablet Oral BID   tamsulosin  0.4 mg Oral Daily   Continuous Infusions:     LOS: 9 days   Time spent: 34min  [name] Haji C Deandrew Hoecker, DO [facility]  If 7PM-7AM, please contact night-coverage [email or web address]  11/30/2021, 6:59 AM

## 2021-11-30 NOTE — Progress Notes (Addendum)
Physical Therapy Treatment Patient Details Name: [name]. MRN: [id number] DOB: 1970/03/01 Today's Date: 11/30/2021   History of Present Illness 51 yo male admitted 9/30 ICH 4th ventricle and lateral ventricles.  PMH includes: ESRD on HD TTS, HTN, amputation L big toe, L eye absent, CVA, afib on plavix and Eliquis, Glaucoma, seizure, PVD, and CHF.    PT Comments    Pt received in supine, A&O to self/situation/time but mildly irritable with cognitive assessment and memory related questions, following simple 1-step commands with increased time. Pt needing up to +2 maxA for seated scooting and +2 modA for sit<>stand from EOB and standing BP assessment. Pt with symptoms of orthostatic hypotension with standing and while seated EOB after standing. He was also coughing frequently and PTA notified SLP as he had eaten just prior to session and may have needed follow-up related to swallowing/digestion. Discussed benefits of gradually increased mobility and likely progression of therapies, pt agreeable to attempt OOB transfer to chair next session if BP more stable. He may benefit from TED hose for improved hemodynamics during therapies. Pt continues to benefit from PT services to progress toward functional mobility goals.   Recommendations for follow up therapy are one component of a multi-disciplinary discharge planning process, led by the attending physician.  Recommendations may be updated based on patient status, additional functional criteria and insurance authorization.  Follow Up Recommendations  Acute inpatient rehab (3hours/day)     Assistance Recommended at Discharge Frequent or constant Supervision/Assistance  Patient can return home with the following Two people to help with walking and/or transfers;Direct supervision/assist for medications management;Assist for transportation;Assistance with cooking/housework;Direct supervision/assist for financial management;A lot of help with  bathing/dressing/bathroom;Help with stairs or ramp for entrance   Equipment Recommendations  Other (comment) (defer to post-acute)    Recommendations for Other Services Speech consult (may need swallow consult-nausea/coughing recently after eating)     Precautions / Restrictions Precautions Precautions: Fall Precaution Comments: seizure, HD port R chest, HD fistulas BUE, orthostatic hypotension watch BP Restrictions Weight Bearing Restrictions: No     Mobility  Bed Mobility Overal bed mobility: Needs Assistance Bed Mobility: Rolling, Sidelying to Sit, Sit to Sidelying Rolling: Min guard Sidelying to sit: HOB elevated, Min assist, +2 for safety/equipment     Sit to sidelying: Mod assist, +2 for safety/equipment General bed mobility comments: increased time, cues for sequencing, assist with BLE and trunk. Fatigued with standing attempt resulting in need for increased assist for back to bed.    Transfers Overall transfer level: Needs assistance Equipment used: Rolling walker (2 wheels) Transfers: Sit to/from Stand Sit to Stand: From elevated surface, +2 physical assistance, Min assist          Lateral/Scoot Transfers: Max assist, +2 physical assistance General transfer comment: cues for hand placement and sequencing, assist to power up and stabilize balance, increased time. Pt with symptoms of orthostatic hypotension after standing and c/o nausea, also fatigued and unable to attempt pivot to recliner. +2 maxA to scoot to his R side x2 reps toward [facility]. SBP soft sitting after standing trial so return to supine for pt safety given worsening seated balance.    Ambulation/Gait               General Gait Details: defer, pt with orthostatic symptoms standing and seated and too fatigued.   Modified Rankin (Stroke Patients Only) Modified Rankin (Stroke Patients Only) Pre-Morbid Rankin Score: Moderate disability Modified Rankin: Severe disability     Balance Overall  balance  assessment: Needs assistance Sitting-balance support: Feet supported, Bilateral upper extremity supported Sitting balance-Leahy Scale: Poor Sitting balance - Comments: min-max A at EOB with varying UE support, pt at times needing +2 for safety due to extreme anterior trunk flexion while c/o nausea Postural control: Right lateral lean, Other (comment) (anterior lean after standing) Standing balance support: Bilateral upper extremity supported, Reliant on assistive device for balance Standing balance-Leahy Scale: Poor Standing balance comment: min to modA +2 for safety during standing BP check at RW, pt with evolving sx dizziness/fatigue and nausea and unable to attempt dynamic standing tasks                            Cognition Arousal/Alertness: Awake/alert Behavior During Therapy: Flat affect, Anxious Overall Cognitive Status: Impaired/Different from baseline Area of Impairment: Attention, Following commands, Safety/judgement, Awareness, Problem solving, Memory                   Current Attention Level: Sustained Memory: Decreased short-term memory Following Commands: Follows one step commands with increased time, Follows multi-step commands inconsistently Safety/Judgement: Decreased awareness of deficits, Decreased awareness of safety Awareness: Emergent Problem Solving: Decreased initiation, Requires verbal cues, Slow processing, Difficulty sequencing, Requires tactile cues General Comments: Slow to respond. Easily agitated but may be partly due to anxiety/fear of mobility due to previous orthostatic episodes. Pt stating "no" when asked if he was dizzy, but when standing pt states "the room is spinning" and then agreeing that he feels dizzy.        Exercises General Exercises - Lower Extremity Ankle Circles/Pumps: AROM, Both, 10 reps Heel Slides: AROM, AAROM, Both, 5 reps, Supine Hip ABduction/ADduction: AROM, AAROM, Both (~3 reps with max cues) Other  Exercises Other Exercises: pulling forwards on R bed rail for "crunch" forward x 2 reps with limited movement Other Exercises: lateral leans with elbow taps x 3 reps ea Other Exercises: anterior reaching with lean to L and R side Other Exercises: midline static sitting ~3 mins prior to reaching/leaning activity    General Comments General comments (skin integrity, edema, etc.): BP 174/93 supine; BP 141/83 seated EOB; BP 146/132 (138) standing; BP 140/49 (72) seated EOB after standing; BP 137/54 (74) supine with BLE elevated slightly; HR 60-62 bpm in each position checked and SpO2 WFL on RA.      Pertinent Vitals/Pain Pain Assessment Pain Assessment: No/denies pain Pain Intervention(s): Monitored during session, Repositioned     PT Goals (current goals can now be found in the care plan section) Acute Rehab PT Goals Patient Stated Goal: to get better like I did after my last stroke PT Goal Formulation: Patient unable to participate in goal setting Time For Goal Achievement: 12/07/21 Progress towards PT goals: Progressing toward goals    Frequency    Min 4X/week      PT Plan Current plan remains appropriate       AM-PAC PT "6 Clicks" Mobility   Outcome Measure  Help needed turning from your back to your side while in a flat bed without using bedrails?: A Little Help needed moving from lying on your back to sitting on the side of a flat bed without using bedrails?: A Lot Help needed moving to and from a bed to a chair (including a wheelchair)?: Total Help needed standing up from a chair using your arms (e.g., wheelchair or bedside chair)?: A Lot Help needed to walk in hospital room?: Total Help needed climbing 3-5 steps with  a railing? : Total 6 Click Score: 10    End of Session Equipment Utilized During Treatment: Gait belt Activity Tolerance: Patient limited by fatigue;Treatment limited secondary to medical complications (Comment);Other (comment) (orthostatic  hypotension) Patient left: in bed;with call bell/phone within reach;with bed alarm set;Other (comment) (BLE elevated) Nurse Communication: Mobility status;Other (comment) (orthostatic BP) PT Visit Diagnosis: Other abnormalities of gait and mobility (R26.89);Other symptoms and signs involving the nervous system (R29.898);Muscle weakness (generalized) (M62.81)     Time: 8875-7972 PT Time Calculation (min) (ACUTE ONLY): 31 min  Charges:  $Therapeutic Exercise: 8-22 mins $Therapeutic Activity: 8-22 mins                     Kache Mcclurg P., PTA Acute Rehabilitation Services Secure Chat Preferred 9a-5:30pm Office: [place] 11/30/2021, 5:12 PM

## 2021-11-30 NOTE — Progress Notes (Signed)
Patient ID: [name]., male   DOB: [birth year], 51 y.o.   MRN: [id number] S: No events overnight O:BP (!) 187/91 (BP Location: Right Leg)   Pulse (!) 59   Temp 98 F (36.7 C) (Oral)   Resp 16   Ht 6\' 3"  (1.905 m)   Wt 78.2 kg   SpO2 98%   BMI 21.55 kg/m   Intake/Output Summary (Last 24 hours) at 11/30/2021 1107 Last data filed at 11/30/2021 0900 Gross per 24 hour  Intake 360 ml  Output --  Net 360 ml   Intake/Output: I/O last 3 completed shifts: In: 21 [P.O.:237] Out: -   Intake/Output this shift:  Total I/O In: 360 [P.O.:360] Out: -  Weight change:  Gen: NAD CVS: bradycardic at 59 Resp: CTA  Abd:+BS, soft, NT/ND Ext: no edema, LUE AVF +T/B  Recent Labs  Lab 11/24/21 0758 11/25/21 0759 11/26/21 0327 11/27/21 0429  NA 136 135 132* 133*  K 4.5 3.8 4.0 3.9  CL 97* 94* 96* 93*  CO2 19* 22 19* 22  GLUCOSE 71 90 99 83  BUN 61* 31* 46* 46*  CREATININE 14.22* 8.71* 10.44* 10.28*  ALBUMIN 2.7*  --   --   --   CALCIUM 8.3* 8.6* 7.8* 9.0  PHOS 9.0*  --   --   --    Liver Function Tests: Recent Labs  Lab 11/24/21 0758  ALBUMIN 2.7*   No results for input(s): "LIPASE", "AMYLASE" in the last 168 hours. No results for input(s): "AMMONIA" in the last 168 hours. CBC: Recent Labs  Lab 11/25/21 0759 11/26/21 0327 11/27/21 0429 11/28/21 0334 11/29/21 0643  WBC 10.0 9.4 7.8 7.4 7.7  HGB 9.2* 9.1* 9.0* 8.8* 9.4*  HCT 29.6* 30.4* 30.2* 29.2* 30.4*  MCV 73.6* 73.4* 73.5* 73.7* 73.8*  PLT 125* 129* 159 200 216   Cardiac Enzymes: No results for input(s): "CKTOTAL", "CKMB", "CKMBINDEX", "TROPONINI" in the last 168 hours. CBG: Recent Labs  Lab 11/29/21 1003  GLUCAP 116*    Iron Studies: No results for input(s): "IRON", "TIBC", "TRANSFERRIN", "FERRITIN" in the last 72 hours. Studies/Results: No results found.  amiodarone  200 mg Oral Daily   amLODipine  10 mg Oral Daily   calcium acetate  1,334 mg Oral TID WC   carvedilol  25 mg Oral BID    Chlorhexidine Gluconate Cloth  6 each Topical Daily   cloNIDine  0.3 mg Oral Q8H   darbepoetin (ARANESP) injection - DIALYSIS  150 mcg Intravenous Q Tue-HD   dorzolamide-timolol  1 drop Right Eye BID   heparin injection (subcutaneous)  5,000 Units Subcutaneous Q8H   irbesartan  300 mg Oral Daily   levETIRAcetam  250 mg Oral Q T,Th,Sa-HD   levETIRAcetam  500 mg Oral BID   pantoprazole  40 mg Oral Daily   pneumococcal 20-valent conjugate vaccine  0.5 mL Intramuscular Tomorrow-1000   prednisoLONE acetate  1 drop Left Eye Q4H while awake   senna-docusate  1 tablet Oral BID   tamsulosin  0.4 mg Oral Daily    BMET    Component Value Date/Time   NA 133 (L) 11/27/2021 0429   K 3.9 11/27/2021 0429   CL 93 (L) 11/27/2021 0429   CO2 22 11/27/2021 0429   GLUCOSE 83 11/27/2021 0429   BUN 46 (H) 11/27/2021 0429   CREATININE 10.28 (H) 11/27/2021 0429   CALCIUM 9.0 11/27/2021 0429   GFRNONAA 5 (L) 11/27/2021 0429   GFRAA 5 (L) 08/27/2019 1540   CBC  Component Value Date/Time   WBC 7.7 11/29/2021 0643   RBC 4.12 (L) 11/29/2021 0643   HGB 9.4 (L) 11/29/2021 0643   HCT 30.4 (L) 11/29/2021 0643   PLT 216 11/29/2021 0643   MCV 73.8 (L) 11/29/2021 0643   MCH 22.8 (L) 11/29/2021 0643   MCHC 30.9 11/29/2021 0643   RDW 19.8 (H) 11/29/2021 0643   LYMPHSABS 1.5 10/24/2021 0805   MONOABS 0.6 10/24/2021 0805   EOSABS 0.3 10/24/2021 0805   BASOSABS 0.0 10/24/2021 0805   Dialysis Orders: Center: [facility]  on TTS . EDW 87 kg HD Bath 2K/2.5Ca  Time 3:30 Heparin . 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  Antithrombotic therapy contraindicated with ICH. ESRD: We will continue TTS schedule via  right IJ TDC; next dialysis due 10/10.  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 Anemia: Hgb currently lower without any overt losses, begin ESA.   CKD-MBD: Calcium level acceptable, will monitor phosphorus trends. Nutrition: Continue current renal diet/fluid restriction with monitoring albumin levels to decide on need for nutritional supplementation. Hypertension: Blood pressure improving on amlodipine, carvedilol, irbesartan and clonidine.    Donetta Potts, MD [facility]

## 2021-11-30 NOTE — Progress Notes (Signed)
11/30/21 0714 11/30/21 0715 11/30/21 0716  OT Visit Information  Last OT Received On 11/30/21  --   --   Assistance Needed +2  --   --   History of Present Illness 51 yo male admitted 9/30 ICH 4th ventricle and lateral ventricles.  PMH includes: ESRD on HD TTS, HTN, amputation L big toe, L eye absent, CVA, afib on plavix and Eliquis, Glaucoma, seizure, PVD, and CHF.  --   --   Precautions  Precautions Fall  --   --   Precaution Comments seizure, HD port R chest, HD fistulas BUE, monitor BPs have been soft  --   --   Restrictions  Weight Bearing Restrictions No  --   --   Pain Assessment  Pain Assessment  --  No/denies pain  --   Cognition  Arousal/Alertness  --  Awake/alert  --   Behavior During Therapy  --  Flat affect  --   Overall Cognitive Status  --  Impaired/Different from baseline  --   Area of Impairment  --  Attention;Following commands;Safety/judgement;Awareness;Problem solving  --   Current Attention Level  --  Sustained  --   Following Commands  --  Follows one step commands with increased time  --   Safety/Judgement  --  Decreased awareness of deficits;Decreased awareness of safety  --   Awareness  --  Emergent  --   Problem Solving  --  Decreased initiation;Requires verbal cues  --   Upper Extremity Assessment  RUE Deficits / Details  --   --  generalized weakness; finger amputation  LUE Deficits / Details  --   --  generalized weakness; limited shoulder ROM at basleine from apparent RTC insufficiency  LUE Coordination  --   --  decreased fine motor;decreased gross motor  ADL  Overall ADL's   --   --  Needs assistance/impaired  Grooming Details (indicate cue type and reason)  --   --  minimal A in supported trunk position; cannot maintain sitting balance in unsupported position thus cannot do grooming in this position  General ADL Comments  --   --  Pt needs min-mod A for sitting balance even with UE support at times. He is able to cross one leg over the other while  seated on unsupported surface but needs Mod sitting support and cannot get to his feet in this position even if supported due to tightness in his hips  Bed Mobility  Overal bed mobility  --   --   --   Bed Mobility  --   --   --   Sidelying to sit  --   --   --   Supine to sit  --   --   --   Transfers  Overall transfer level  --   --   --   Equipment used  --   --   --   Transfers  --   --   --   Sit to Stand  --   --   --   Balance  Overall balance assessment  --   --   --   Sitting-balance support  --   --   --   Sitting balance-Leahy Scale  --   --   --   Sitting balance - Comments  --   --   --   Standing balance support  --   --   --   Standing balance-Leahy Scale  --   --   --  OT - End of Session  Equipment Utilized During Treatment  --   --   --   Activity Tolerance  --   --   --   Patient left  --   --   --   OT Assessment/Plan  OT Plan  --   --   --   OT Visit Diagnosis  --   --   --   OT Frequency (ACUTE ONLY)  --   --   --   Recommendations for Other Services  --   --   --   Follow Up Recommendations  --   --   --   Assistance recommended at discharge  --   --   --   Patient can return home with the following  --   --   --   OT Equipment  --   --   --   AM-PAC OT "6 Clicks" Daily Activity Outcome Measure (Version 2)  Help from another person eating meals?  --   --   --   Help from another person taking care of personal grooming?  --   --   --   Help from another person toileting, which includes using toliet, bedpan, or urinal?  --   --   --   Help from another person bathing (including washing, rinsing, drying)?  --   --   --   Help from another person to put on and taking off regular upper body clothing?  --   --   --   Help from another person to put on and taking off regular lower body clothing?  --   --   --   6 Click Score  --   --   --   Progressive Mobility  What is the highest level of mobility based on the progressive mobility assessment?  --   --   --    Activity  --   --   --   OT Goal Progression  Progress towards OT goals  --   --   --   Acute Rehab OT Goals  Patient Stated Goal  --   --   --   OT Goal Formulation  --   --   --   Time For Goal Achievement  --   --   --   Potential to Achieve Goals  --   --   --   OT Time Calculation  OT Start Time (ACUTE ONLY)  --   --   --   OT Stop Time (ACUTE ONLY)  --   --   --   OT Time Calculation (min)  --   --   --   OT General Charges  $OT Visit  --   --   --   OT Treatments  $Self Care/Home Management   --   --   --     11/30/21 0720 11/30/21 0721 11/30/21 0722  OT Visit Information  Last OT Received On  --   --   --   Assistance Needed  --   --   --   History of Present Illness  --   --   --   Precautions  Precautions  --   --   --   Precaution Comments  --   --   --   Restrictions  Weight Bearing Restrictions  --   --   --   Pain Assessment  Pain Assessment  --   --   --  Cognition  Arousal/Alertness  --   --   --   Behavior During Therapy  --   --   --   Overall Cognitive Status  --   --   --   Area of Impairment  --   --   --   Current Attention Level  --   --   --   Following Commands  --   --   --   Safety/Judgement  --   --   --   Awareness  --   --   --   Problem Solving  --   --   --   Upper Extremity Assessment  RUE Deficits / Details  --   --   --   LUE Deficits / Details  --   --   --   LUE Coordination  --   --   --   ADL  Overall ADL's   --   --   --   Grooming Details (indicate cue type and reason)  --   --   --   General ADL Comments  --   --   --   Bed Mobility  Overal bed mobility Needs Assistance  --   --   Bed Mobility Supine to Sit;Sit to Supine  --   --   Sidelying to sit Min guard;HOB elevated (increased time)  --   --   Supine to sit Min guard;HOB elevated (increased time fro legs)  --   --   Transfers  Overall transfer level Needs assistance  --   --   Equipment used Rolling walker (2 wheels)  --   --   Transfers Sit to/from Stand  --    --   Sit to Stand Mod assist;From elevated surface  --   --   Balance  Overall balance assessment  --  Needs assistance  --   Sitting-balance support  --  Feet supported;Bilateral upper extremity supported  --   Sitting balance-Leahy Scale  --  Poor  --   Sitting balance - Comments  --  min-mod A at EOB with varying UE support  --   Standing balance support  --  Bilateral upper extremity supported;Reliant on assistive device for balance  --   Standing balance-Leahy Scale  --  Poor  --   OT - End of Session  Equipment Utilized During Treatment  --   --  Gait belt  Activity Tolerance  --   --   (reports dizziness; BP supine 95/51 HR 59 ; BP sitting 85/54  HR 60; unable to get in standing before he had to sit)  Patient left  --   --  in bed;with call bell/phone within reach;with bed alarm set  OT Assessment/Plan  OT Plan  --   --  Discharge plan remains appropriate  OT Visit Diagnosis  --   --  Unsteadiness on feet (R26.81);Muscle weakness (generalized) (M62.81);Low vision, both eyes (H54.2);Other symptoms and signs involving cognitive function  OT Frequency (ACUTE ONLY)  --   --   --   Recommendations for Other Services  --   --   --   Follow Up Recommendations  --   --   --   Assistance recommended at discharge  --   --   --   Patient can return home with the following  --   --   --   OT Equipment  --   --   --  AM-PAC OT "6 Clicks" Daily Activity Outcome Measure (Version 2)  Help from another person eating meals?  --   --   --   Help from another person taking care of personal grooming?  --   --   --   Help from another person toileting, which includes using toliet, bedpan, or urinal?  --   --   --   Help from another person bathing (including washing, rinsing, drying)?  --   --   --   Help from another person to put on and taking off regular upper body clothing?  --   --   --   Help from another person to put on and taking off regular lower body clothing?  --   --   --   6 Click Score   --   --   --   Progressive Mobility  What is the highest level of mobility based on the progressive mobility assessment?  --   --   --   Activity  --   --   --   OT Goal Progression  Progress towards OT goals  --   --   --   Acute Rehab OT Goals  Patient Stated Goal  --   --   --   OT Goal Formulation  --   --   --   Time For Goal Achievement  --   --   --   Potential to Achieve Goals  --   --   --   OT Time Calculation  OT Start Time (ACUTE ONLY)  --   --   --   OT Stop Time (ACUTE ONLY)  --   --   --   OT Time Calculation (min)  --   --   --   OT General Charges  $OT Visit  --   --   --   OT Treatments  $Self Care/Home Management   --   --   --     11/30/21 0725 11/30/21 0726  OT Visit Information  Last OT Received On  --   --   Assistance Needed  --   --   History of Present Illness  --   --   Precautions  Precautions  --   --   Precaution Comments  --   --   Restrictions  Weight Bearing Restrictions  --   --   Pain Assessment  Pain Assessment  --   --   Cognition  Arousal/Alertness  --   --   Behavior During Therapy  --   --   Overall Cognitive Status  --   --   Area of Impairment  --   --   Current Attention Level  --   --   Following Commands  --   --   Safety/Judgement  --   --   Awareness  --   --   Problem Solving  --   --   Upper Extremity Assessment  RUE Deficits / Details  --   --   LUE Deficits / Details  --   --   LUE Coordination  --   --   ADL  Overall ADL's   --   --   Grooming Details (indicate cue type and reason)  --   --   General ADL Comments  --   --   Bed Mobility  Overal bed mobility  --   --   Bed Mobility  --   --  Sidelying to sit  --   --   Supine to sit  --   --   Transfers  Overall transfer level  --   --   Equipment used  --   --   Transfers  --   --   Sit to Stand  --   --   Balance  Overall balance assessment  --   --   Sitting-balance support  --   --   Sitting balance-Leahy Scale  --   --   Sitting balance -  Comments  --   --   Standing balance support  --   --   Standing balance-Leahy Scale  --   --   OT - End of Session  Equipment Utilized During Treatment  --   --   Activity Tolerance  --   --   Patient left  --   --   OT Assessment/Plan  OT Plan  --   --   OT Visit Diagnosis  --   --   OT Frequency (ACUTE ONLY) Min 2X/week  --   Recommendations for Other Services Rehab consult  --   Follow Up Recommendations Acute inpatient rehab (3hours/day)  --   Assistance recommended at discharge Frequent or constant Supervision/Assistance  --   Patient can return home with the following Two people to help with walking and/or transfers;Two people to help with bathing/dressing/bathroom;Assistance with cooking/housework;Help with stairs or ramp for entrance;Assist for transportation;Direct supervision/assist for financial management;Direct supervision/assist for medications management  --   OT Equipment BSC/3in1;Wheelchair (measurements OT);Wheelchair cushion (measurements OT)  --   AM-PAC OT "6 Clicks" Daily Activity Outcome Measure (Version 2)  Help from another person eating meals? 3  --   Help from another person taking care of personal grooming? 3  --   Help from another person toileting, which includes using toliet, bedpan, or urinal? 2  --   Help from another person bathing (including washing, rinsing, drying)? 2  --   Help from another person to put on and taking off regular upper body clothing? 2  --   Help from another person to put on and taking off regular lower body clothing? 1  --   6 Click Score 13  --   Progressive Mobility  What is the highest level of mobility based on the progressive mobility assessment? Level 2 (Chairfast) - Balance while sitting on edge of bed and cannot stand  --   Activity Dangled on edge of bed  --   OT Goal Progression  Progress towards OT goals  --  Progressing toward goals  Acute Rehab OT Goals  Patient Stated Goal  --  to not  feel dizzy  OT Goal  Formulation  --  With patient  Time For Goal Achievement  --  12/07/21  Potential to Achieve Goals  --  Good  OT Time Calculation  OT Start Time (ACUTE ONLY)  --  1449  OT Stop Time (ACUTE ONLY)  --  1517  OT Time Calculation (min)  --  28 min  OT General Charges  $OT Visit  --  1 Visit  OT Treatments  $Self Care/Home Management   --  23-37 mins

## 2021-12-01 DIAGNOSIS — T827XXA Infection and inflammatory reaction due to other cardiac and vascular devices, implants and grafts, initial encounter: Secondary | ICD-10-CM | POA: Diagnosis not present

## 2021-12-01 DIAGNOSIS — I161 Hypertensive emergency: Secondary | ICD-10-CM | POA: Diagnosis not present

## 2021-12-01 DIAGNOSIS — I615 Nontraumatic intracerebral hemorrhage, intraventricular: Secondary | ICD-10-CM | POA: Diagnosis not present

## 2021-12-01 DIAGNOSIS — N186 End stage renal disease: Secondary | ICD-10-CM | POA: Diagnosis not present

## 2021-12-01 LAB — RENAL FUNCTION PANEL
Albumin: 2.5 g/dL — ABNORMAL LOW (ref 3.5–5.0)
Anion gap: 11 (ref 5–15)
BUN: 38 mg/dL — ABNORMAL HIGH (ref 6–20)
CO2: 27 mmol/L (ref 22–32)
Calcium: 8.2 mg/dL — ABNORMAL LOW (ref 8.9–10.3)
Chloride: 96 mmol/L — ABNORMAL LOW (ref 98–111)
Creatinine, Ser: 8.21 mg/dL — ABNORMAL HIGH (ref 0.61–1.24)
GFR, Estimated: 7 mL/min — ABNORMAL LOW (ref >60)
Glucose, Bld: 109 mg/dL — ABNORMAL HIGH (ref 70–99)
Phosphorus: 3.7 mg/dL (ref 2.5–4.6)
Potassium: 3.5 mmol/L (ref 3.5–5.1)
Sodium: 134 mmol/L — ABNORMAL LOW (ref 135–145)

## 2021-12-01 LAB — CBC
HCT: 30.7 % — ABNORMAL LOW (ref 39.0–52.0)
Hemoglobin: 9.3 g/dL — ABNORMAL LOW (ref 13.0–17.0)
MCH: 22.5 pg — ABNORMAL LOW (ref 26.0–34.0)
MCHC: 30.3 g/dL (ref 30.0–36.0)
MCV: 74.2 fL — ABNORMAL LOW (ref 80.0–100.0)
Platelets: 268 10*3/uL (ref 150–400)
RBC: 4.14 MIL/uL — ABNORMAL LOW (ref 4.22–5.81)
RDW: 20 % — ABNORMAL HIGH (ref 11.5–15.5)
WBC: 6.1 10*3/uL (ref 4.0–10.5)
nRBC: 0 % (ref 0.0–0.2)

## 2021-12-01 LAB — GLUCOSE, CAPILLARY: Glucose-Capillary: 119 mg/dL — ABNORMAL HIGH (ref 70–99)

## 2021-12-01 MED ORDER — HEPARIN SODIUM (PORCINE) 1000 UNIT/ML IJ SOLN
INTRAMUSCULAR | Status: AC
  Administered 2021-12-01: 1000 [IU]
  Filled 2021-12-01: qty 5

## 2021-12-01 NOTE — Progress Notes (Signed)
Physical Therapy Treatment Patient Details Name: [name]. 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  PMH includes: ESRD on HD TTS, HTN, amputation L big toe, L eye absent, CVA, afib on plavix and Eliquis, Glaucoma, seizure, PVD, and CHF.    PT Comments    Pt received in supine, agreeable to therapy session with heavy encouragement after recently returning from HD session. Pt with episode coughing and nausea/vomiting after sitting EOB ~3 mins (of note pt recently finished eating) and does not report dizziness, however SBP significant drop from supine to seated posture. DBP and MAP significantly elevated while pt seated EOB but once he transferred to recliner, MAP and DBP WFL. Pt needing up to +2 modA for log roll transfer to R EOB and for lateral scoot transfer from bed>chair after episode of n/v. RN notified of pt symptoms and safe technique for return transfer via lateral scooting. Pt continues to benefit from PT services to progress toward functional mobility goals.   Orthostatic BP (all taken with knee-high XL TED hose donned in R calf (BP reading inaccurately in R forearm and fistulas in BUE)  Supine 192/61 (99);  **Addendum 10/11: BP also taken in R wrist during session (to avoid fistula site) just after supine RLE reading taken on 10/10 and reading 145/43 (69) Pt subsequent BP taken in RLE to check for orthostatics with TED hose donned, unsure of accuracy of initial supine RLE reading given TED hose placed during session and may be artificially elevating BP. Decided to keep seated/standing readings in RLE to check trend of SBP changes.**  Sitting 156/112 (123)  Sitting after 3 min 165/118 (130)  Episode n/v during assessment  Reclined in chair 155/80 (101)  Pt HR 62 bpm resting and up to 70 bpm with seated posture during n/v.   Recommendations for follow up therapy  are one component of a multi-disciplinary discharge planning process, led by the attending physician.  Recommendations may be updated based on patient status, additional functional criteria and insurance authorization.  Follow Up Recommendations  Acute inpatient rehab (3hours/day)     Assistance Recommended at Discharge Frequent or constant Supervision/Assistance  Patient can return home with the following Two people to help with walking and/or transfers;Direct supervision/assist for medications management;Assist for transportation;Assistance with cooking/housework;Direct supervision/assist for financial management;A lot of help with bathing/dressing/bathroom;Help with stairs or ramp for entrance   Equipment Recommendations  Other (comment) (defer to post-acute)    Recommendations for Other Services Speech consult;Other (comment) (may need swallow consult-nausea/coughing recently after eating with esophageal type symptoms)     Precautions / Restrictions Precautions Precautions: Fall Precaution Comments: SBP goal <160; seizure, HD port R chest, HD fistulas BUE, orthostatic hypotension Restrictions Weight Bearing Restrictions: No     Mobility  Bed Mobility Overal bed mobility: Needs Assistance Bed Mobility: Rolling, Sidelying to Sit Rolling: Min guard Sidelying to sit: +2 for physical assistance, Mod assist       General bed mobility comments: increased time, cues for sequencing, assist with BLE and trunk, modA for trunk raising and minA for BLE guidance to sit on R side of bed.    Transfers Overall transfer level: Needs assistance Equipment used: None Transfers: Bed to chair/wheelchair/BSC            Lateral/Scoot Transfers: +2 physical assistance, Mod assist General transfer comment: cues for hand placement and to forward weight shift with head/hips relationship  in prep for scooting laterally to R side; x3 scoots with bed pad assist needed and some manual assist for BLE  placement/safety    Ambulation/Gait               General Gait Details: episode n/v seated EOB so pt defer, agreeable to scooting OOB to chair   Stairs             Wheelchair Mobility    Modified Rankin (Stroke Patients Only) Modified Rankin (Stroke Patients Only) Pre-Morbid Rankin Score: Moderate disability Modified Rankin: Severe disability     Balance Overall balance assessment: Needs assistance Sitting-balance support: Feet supported, Bilateral upper extremity supported Sitting balance-Leahy Scale: Poor Sitting balance - Comments: poor initially, approaching fair, needing cues to stay at midline, pt with R lateral lean at EOB but after propping on L elbow, improved midline posture Postural control: Right lateral lean, Other (comment)     Standing balance comment: deferred, pt vomiting seated EOB                            Cognition Arousal/Alertness: Awake/alert Behavior During Therapy: Flat affect, Anxious Overall Cognitive Status: Impaired/Different from baseline Area of Impairment: Attention, Following commands, Safety/judgement, Awareness, Problem solving, Memory                   Current Attention Level: Sustained Memory: Decreased short-term memory Following Commands: Follows one step commands with increased time, Follows multi-step commands inconsistently Safety/Judgement: Decreased awareness of deficits, Decreased awareness of safety Awareness: Emergent Problem Solving: Slow processing, Decreased initiation, Difficulty sequencing, Requires verbal cues, Requires tactile cues General Comments: Pt initially anxious prior to attempting OOB mobility but agreeable after discussion on importance of mobility for strength/endurance building.        Exercises General Exercises - Lower Extremity Ankle Circles/Pumps: AROM, Both, 10 reps Heel Slides: AROM, AAROM, Both, 5 reps, Supine Other Exercises Other Exercises: lateral leans with  elbow taps x 3 reps ea    General Comments General comments (skin integrity, edema, etc.): BP 192/61 (99) in RLE (TED hose donned throughout); BP 156/112 (123) seated initially; BP 165/118 (130) seated after ~3 mins, episode n/v began; BP 155/80 (101) after reclined in chair, nausea improving; HR 62-70 bpm with exertion.      Pertinent Vitals/Pain Pain Assessment Pain Assessment: Faces Faces Pain Scale: Hurts even more Pain Location: back/bottom with mobility (especially when pad removed from underneath him- shearing type forces) Pain Descriptors / Indicators: Discomfort, Grimacing, Moaning Pain Intervention(s): Monitored during session, Repositioned, Limited activity within patient's tolerance (pillow on chair under his hips for comfort)     PT Goals (current goals can now be found in the care plan section) Acute Rehab PT Goals Patient Stated Goal: to get better like I did after my last stroke PT Goal Formulation: Patient unable to participate in goal setting Time For Goal Achievement: 12/07/21 Progress towards PT goals: Progressing toward goals    Frequency    Min 4X/week      PT Plan Current plan remains appropriate    Co-evaluation PT/OT/SLP Co-Evaluation/Treatment: Yes Reason for Co-Treatment: Necessary to address cognition/behavior during functional activity;For patient/therapist safety;To address functional/ADL transfers PT goals addressed during session: Mobility/safety with mobility;Balance;Strengthening/ROM OT goals addressed during session: ADL's and self-care;Strengthening/ROM      AM-PAC PT "6 Clicks" Mobility   Outcome Measure  Help needed turning from your back to your side while in a flat bed without using bedrails?:  A Little Help needed moving from lying on your back to sitting on the side of a flat bed without using bedrails?: A Lot Help needed moving to and from a bed to a chair (including a wheelchair)?: A Lot Help needed standing up from a chair  using your arms (e.g., wheelchair or bedside chair)?: A Lot Help needed to walk in hospital room?: Total Help needed climbing 3-5 steps with a railing? : Total 6 Click Score: 11    End of Session Equipment Utilized During Treatment: Gait belt Activity Tolerance: Treatment limited secondary to medical complications (Comment);Other (comment) (episode nausea/vomiting (multiple) while seated EOB and elevated DBP, pt sx improved once in recliner, RN notified) Patient left: in chair;with call bell/phone within reach;with chair alarm set;Other (comment) (TED hose remained donned) Nurse Communication: Mobility status;Other (comment) (drop in SBP/raise in DBP with EOB and also pt with episode n/v needing anti-nausea meds; safe technique for return to bed from the chair) PT Visit Diagnosis: Other abnormalities of gait and mobility (R26.89);Other symptoms and signs involving the nervous system (R29.898);Muscle weakness (generalized) (M62.81)     Time: 0258-5277 PT Time Calculation (min) (ACUTE ONLY): 33 min  Charges:  $Therapeutic Activity: 8-22 mins                     Soleia Badolato P., PTA Acute Rehabilitation Services Secure Chat Preferred 9a-5:30pm Office: [place] 12/01/2021, 5:32 PM

## 2021-12-01 NOTE — Procedures (Signed)
I was present at this dialysis session. I have reviewed the session itself and made appropriate changes.   Given orthostatic hypotension while working with PT will keep even with HD today.   Vital signs in last 24 hours:  Temp:  [97.7 F (36.5 C)-98.5 F (36.9 C)] 98.3 F (36.8 C) (10/10 0755) Pulse Rate:  [60-62] 61 (10/10 0755) Resp:  [16-19] 19 (10/10 0755) BP: (141-197)/(38-93) 155/75 (10/10 0755) SpO2:  [94 %-99 %] 94 % (10/10 0755) Weight change:  Filed Weights   11/28/21 0027 11/28/21 0827 11/28/21 1243  Weight: 79.1 kg 79.2 kg 78.2 kg    Recent Labs  Lab 11/27/21 0429  NA 133*  K 3.9  CL 93*  CO2 22  GLUCOSE 83  BUN 46*  CREATININE 10.28*  CALCIUM 9.0    Recent Labs  Lab 11/27/21 0429 11/28/21 0334 11/29/21 0643  WBC 7.8 7.4 7.7  HGB 9.0* 8.8* 9.4*  HCT 30.2* 29.2* 30.4*  MCV 73.5* 73.7* 73.8*  PLT 159 200 216    Scheduled Meds:  amiodarone  200 mg Oral Daily   amLODipine  10 mg Oral Daily   calcium acetate  1,334 mg Oral TID WC   carvedilol  25 mg Oral BID   Chlorhexidine Gluconate Cloth  6 each Topical Daily   cloNIDine  0.3 mg Oral Q8H   darbepoetin (ARANESP) injection - DIALYSIS  150 mcg Intravenous Q Tue-HD   dorzolamide-timolol  1 drop Right Eye BID   heparin injection (subcutaneous)  5,000 Units Subcutaneous Q8H   irbesartan  300 mg Oral Daily   levETIRAcetam  250 mg Oral Q T,Th,Sa-HD   levETIRAcetam  500 mg Oral BID   pantoprazole  40 mg Oral Daily   pneumococcal 20-valent conjugate vaccine  0.5 mL Intramuscular Tomorrow-1000   prednisoLONE acetate  1 drop Left Eye Q4H while awake   senna-docusate  1 tablet Oral BID   tamsulosin  0.4 mg Oral Daily   Continuous Infusions: PRN Meds:.acetaminophen **OR** acetaminophen (TYLENOL) oral liquid 160 mg/5 mL **OR** acetaminophen, hydrALAZINE, labetalol, metoCLOPramide (REGLAN) injection   Donetta Potts,  MD 12/01/2021, 8:38 AM

## 2021-12-01 NOTE — Progress Notes (Addendum)
Occupational Therapy Treatment Patient Details Name: [name]. 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  PMH includes: ESRD on HD TTS, HTN, amputation L big toe, L eye absent, CVA, afib on plavix and Eliquis, Glaucoma, seizure, PVD, and CHF.   OT comments  Pt with slow progression towards goals, seen after HD session, pt fatigued but agreeable to therapy session. Pt needing min -max A for seated/bed level ADLs, min A+2 for bed mobility, and mod-max A +2 for lateral scoot transfer to chair on R side. Pt able to sit EOB for x10 mins for seated ADL and BP measures. Pt with emesis after sitting upright for ~3 mins, RN notified. Pt presenting with impairments listed below, will follow acutely. Continue to recommend AIR at d/c.   Recommendations for follow up therapy are one component of a multi-disciplinary discharge planning process, led by the attending physician.  Recommendations may be updated based on patient status, additional functional criteria and insurance authorization.    Follow Up Recommendations  Acute inpatient rehab (3hours/day)    Assistance Recommended at Discharge Frequent or constant Supervision/Assistance  Patient can return home with the following  Two people to help with walking and/or transfers;Two people to help with bathing/dressing/bathroom;Assistance with cooking/housework;Help with stairs or ramp for entrance;Assist for transportation;Direct supervision/assist for financial management;Direct supervision/assist for medications management   Equipment Recommendations  BSC/3in1;Wheelchair (measurements OT);Wheelchair cushion (measurements OT)    Recommendations for Other Services Rehab consult    Precautions / Restrictions Precautions Precautions: Fall Precaution Comments: seizure, HD port R chest, HD fistulas BUE, orthostatic hypotension watch  BP Restrictions Weight Bearing Restrictions: No       Mobility Bed Mobility Overal bed mobility: Needs Assistance Bed Mobility: Rolling, Sidelying to Sit   Sidelying to sit: Min assist, +2 for physical assistance            Transfers Overall transfer level: Needs assistance Equipment used: None              Lateral/Scoot Transfers: Max assist, +2 physical assistance, Mod assist General transfer comment: cues for hand placement and to forward weight shift in prep for scooting laterally to R side     Balance Overall balance assessment: Needs assistance Sitting-balance support: Feet supported, Bilateral upper extremity supported Sitting balance-Leahy Scale: Poor Sitting balance - Comments: poor initially, approaching fair, needing cues to stay at midline, pt with R lateral lean at EOB       Standing balance comment: deferred, pt vomiting                           ADL either performed or assessed with clinical judgement   ADL Overall ADL's : Needs assistance/impaired     Grooming: Minimal assistance Grooming Details (indicate cue type and reason): washing face at EOB             Lower Body Dressing: Maximal assistance Lower Body Dressing Details (indicate cue type and reason): donning ted hose and socks Toilet Transfer: Maximal assistance;+2 for physical assistance;Moderate assistance Toilet Transfer Details (indicate cue type and reason): lateral scoot         Functional mobility during ADLs: Moderate assistance;+2 for physical assistance      Extremity/Trunk Assessment Upper Extremity Assessment Upper Extremity Assessment: RUE deficits/detail RUE Deficits / Details: generalized weakness; finger amputation LUE Deficits / Details: generalized weakness; limited shoulder  ROM at basleine from apparent RTC insufficiency LUE Coordination: decreased fine motor;decreased gross motor   Lower Extremity Assessment Lower Extremity Assessment: Defer  to PT evaluation        Vision   Additional Comments: blind L eye, poor vision, cues to find bedrail on R side   Perception Perception Perception: Not tested   Praxis Praxis Praxis: Not tested    Cognition Arousal/Alertness: Awake/alert Behavior During Therapy: Flat affect, Anxious Overall Cognitive Status: Impaired/Different from baseline Area of Impairment: Attention, Following commands, Safety/judgement, Awareness, Problem solving, Memory                   Current Attention Level: Sustained Memory: Decreased short-term memory Following Commands: Follows one step commands with increased time, Follows multi-step commands inconsistently Safety/Judgement: Decreased awareness of deficits, Decreased awareness of safety Awareness: Emergent            Exercises      Shoulder Instructions       General Comments see PT notes for BP, pt with n/v during session, RN notified    Pertinent Vitals/ Pain       Pain Assessment Pain Assessment: Faces Pain Score: 6  Faces Pain Scale: Hurts even more Pain Location: back/bottom with mobility Pain Descriptors / Indicators: Discomfort, Grimacing, Moaning Pain Intervention(s): Monitored during session, Limited activity within patient's tolerance, Repositioned  Home Living                                          Prior Functioning/Environment              Frequency  Min 2X/week        Progress Toward Goals  OT Goals(current goals can now be found in the care plan section)  Progress towards OT goals: Progressing toward goals  Acute Rehab OT Goals Patient Stated Goal: none stated OT Goal Formulation: With patient Time For Goal Achievement: 12/07/21 Potential to Achieve Goals: Good ADL Goals Pt Will Perform Grooming: with min assist;sitting Pt Will Transfer to Toilet: with +2 assist;with mod assist;bedside commode;stand pivot transfer Additional ADL Goal #1: Pt will complete bed mobility  mod (A) as precursor to adls. Additional ADL Goal #2: Pt will static sit EOB min (A) as precursor to adls  Plan Discharge plan remains appropriate    Co-evaluation    PT/OT/SLP Co-Evaluation/Treatment: Yes Reason for Co-Treatment: For patient/therapist safety   OT goals addressed during session: ADL's and self-care;Strengthening/ROM      AM-PAC OT "6 Clicks" Daily Activity     Outcome Measure   Help from another person eating meals?: A Little Help from another person taking care of personal grooming?: A Little Help from another person toileting, which includes using toliet, bedpan, or urinal?: A Lot Help from another person bathing (including washing, rinsing, drying)?: A Lot Help from another person to put on and taking off regular upper body clothing?: A Lot Help from another person to put on and taking off regular lower body clothing?: Total 6 Click Score: 13    End of Session Equipment Utilized During Treatment: Gait belt  OT Visit Diagnosis: Unsteadiness on feet (R26.81);Muscle weakness (generalized) (M62.81);Low vision, both eyes (H54.2);Other symptoms and signs involving cognitive function   Activity Tolerance Patient limited by fatigue   Patient Left with call bell/phone within reach;in chair;with chair alarm set   Nurse Communication Mobility status;Other (comment) (pt with n/v)  Time: 0981-1914 OT Time Calculation (min): 35 min  Charges: OT General Charges $OT Visit: 1 Visit OT Treatments $Self Care/Home Management : 8-22 mins  Lynnda Child, OTD, OTR/L Acute Rehab [phone] - [name] 12/01/2021, 5:14 PM

## 2021-12-01 NOTE — Progress Notes (Signed)
Received patient in bed to unit.  Alert and oriented.  Informed consent signed and in chart.   Treatment initiated: 4591 Treatment completed: 1217  Patient tolerated well.  Transported back to the room  Alert, without acute distress.  Hand-off given to patient's nurse.   Access used: Right HD catheter  Access issues: no  Total UF removed: 0 Medication(s) given: tylenol, heparin locks catheter   Post HD VS: 97.9, 61, 16, 166/82 Post HD weight: 78.3kg    [facility]

## 2021-12-01 NOTE — Progress Notes (Signed)
PROGRESS NOTE   [name].  DPO:[id number] DOB: 1970/11/13 DOA: 11/21/2021 PCP: [name], MD  Brief Narrative:  [name] Taha. is a 51 y.o. male with a history of stroke, atrial fibrillation, NSTEMI and CHF with chronic on Plavix and apixaban who has been having general malaise for the past few days.  Also a cough.  He has had some nausea and vomiting over the past 2 days, but only mild headache.  When he presented to the emergency department, his blood pressure was in the 200s.  Given the nausea and vomiting and severe hypertension, the ER physician at [facility] decided to perform a head CT which demonstrates a fourth ventricle bleed. Transitioned to [facility] care 11/27/21.  Assessment & Plan:   Principal Problem:   Bacteremia associated with intravascular line ([place]) Active Problems:   Intracranial bleed (HCC)   ICH (intracerebral hemorrhage) (HCC)   Hypertensive emergency   Pressure injury of skin  Interventricular hemorrhage - bilateral lateral ventricles/4th ventricle without hydrocephaly Complicated by uncontrolled hypertension Confirmed on imaging -  CTA head & neck No evidence of aneurysm, no LVO or high grade stenosis Follow up CT 10/1, 10/4 stable IVH 2D Echo EF 65 to 70%, large pericardial effusion - no evidence of tamponade LDL 34 HgbA1c 4.5 VTE prophylaxis - SCDs Previously on plavix/eliquis - on hold now given active IVH  Neuro signing off  Hypertensive emergency Home meds:  carvedilol 25 mg BID, amlodipine 5 mg daily, clonidine  0.1 mg TID PRN Currently on amlodipine 10, coreg 25, clonidine 0.3 Previously requiring Cleviprex - now off Keep SBP <160 - Long-term BP goal normotensive  History of seizure Continue keppra;   Fevers, resolving Unclear etiology - ID following Hold all abx and follow clinically Cultures negative; MRSE considered contaminant Possibly secondary to IVH/reactive  Hyperlipidemia Home meds: atorvastatin 80 mg daily LDL 34,  goal < 70 Resume statin at DC   ESRD on HD Patient receives dialysis T, Th, Sat Appreciate nephrology input Continue dialysis per nephrology   Large pericardial effusion Found on both CT chest and 2D echo Likely due to ESRD - hoping volume management will improve effusion No tamponade on 2D echo Hemodynamically stable Close monitoring   Hiccup- Resolved Not able to give thorazine due to QT prolongation Not able to give baclofen due to ESRD Now on reglan Q8h PRN    Incidental L lung nodule Recommend follow up chest CT every 6-12 months to monitor nodules  DVT prophylaxis: heparin Code Status: Full Family Communication: None present  Status is: INpt  Dispo: The patient is from: Home              Anticipated d/c is to: CIR per PT recs              Anticipated d/c date is: 24-48h              Patient currently NOT medically stable for discharge  Consultants:  Neuro, nephro  Procedures:  None  Antimicrobials:  Discontinued 10/6   Subjective: No acute issues/events overnight -episode of orthostatic hypotension today, continue to evaluate with physical therapy  Objective: Vitals:   12/01/21 0042 12/01/21 0312 12/01/21 0412 12/01/21 0755  BP: (!) 157/73 (!) 182/52 (!) 141/74 (!) 155/75  Pulse: 60 61 60 61  Resp: 18 18  19   Temp: 98 F (36.7 C) 98.3 F (36.8 C)  98.3 F (36.8 C)  TempSrc: Oral Oral  Oral  SpO2: 99% 97%  94%  Weight:      Height:        Intake/Output Summary (Last 24 hours) at 12/01/2021 0759 Last data filed at 11/30/2021 1731 Gross per 24 hour  Intake 600 ml  Output --  Net 600 ml    Filed Weights   11/28/21 0027 11/28/21 0827 11/28/21 1243  Weight: 79.1 kg 79.2 kg 78.2 kg    Examination:  General exam: Appears calm and comfortable  Respiratory system: Clear to auscultation. Respiratory effort normal. Cardiovascular system: S1 & S2 heard, RRR. No JVD, murmurs, rubs, gallops or clicks. No pedal edema. Gastrointestinal system:  Abdomen is nondistended, soft and nontender. No organomegaly or masses felt. Normal bowel sounds heard. Central nervous system: Alert and oriented. No focal neurological deficits. Extremities: Symmetric 5 x 5 power. Skin: No rashes, lesions or ulcers Psychiatry: Judgement and insight appear normal. Mood & affect appropriate.     Data Reviewed: I have personally reviewed following labs and imaging studies  CBC: Recent Labs  Lab 11/25/21 0759 11/26/21 0327 11/27/21 0429 11/28/21 0334 11/29/21 0643  WBC 10.0 9.4 7.8 7.4 7.7  HGB 9.2* 9.1* 9.0* 8.8* 9.4*  HCT 29.6* 30.4* 30.2* 29.2* 30.4*  MCV 73.6* 73.4* 73.5* 73.7* 73.8*  PLT 125* 129* 159 200 027    Basic Metabolic Panel: Recent Labs  Lab 11/25/21 0759 11/26/21 0327 11/27/21 0429  NA 135 132* 133*  K 3.8 4.0 3.9  CL 94* 96* 93*  CO2 22 19* 22  GLUCOSE 90 99 83  BUN 31* 46* 46*  CREATININE 8.71* 10.44* 10.28*  CALCIUM 8.6* 7.8* 9.0    GFR: Estimated Creatinine Clearance: 9.2 mL/min (A) (by C-G formula based on SCr of 10.28 mg/dL (H)). Liver Function Tests: No results for input(s): "AST", "ALT", "ALKPHOS", "BILITOT", "PROT", "ALBUMIN" in the last 168 hours.  No results for input(s): "LIPASE", "AMYLASE" in the last 168 hours. No results for input(s): "AMMONIA" in the last 168 hours. Coagulation Profile: No results for input(s): "INR", "PROTIME" in the last 168 hours.  Cardiac Enzymes: No results for input(s): "CKTOTAL", "CKMB", "CKMBINDEX", "TROPONINI" in the last 168 hours. BNP (last 3 results) No results for input(s): "PROBNP" in the last 8760 hours. HbA1C: No results for input(s): "HGBA1C" in the last 72 hours. CBG: Recent Labs  Lab 11/29/21 1003 12/01/21 0309  GLUCAP 116* 119*    Lipid Profile: No results for input(s): "CHOL", "HDL", "LDLCALC", "TRIG", "CHOLHDL", "LDLDIRECT" in the last 72 hours.  Thyroid Function Tests: No results for input(s): "TSH", "T4TOTAL", "FREET4", "T3FREE", "THYROIDAB" in  the last 72 hours. Anemia Panel: No results for input(s): "VITAMINB12", "FOLATE", "FERRITIN", "TIBC", "IRON", "RETICCTPCT" in the last 72 hours. Sepsis Labs: No results for input(s): "PROCALCITON", "LATICACIDVEN" in the last 168 hours.  Recent Results (from the past 240 hour(s))  Resp Panel by RT-PCR (Flu A&B, Covid) Anterior Nasal Swab     Status: None   Collection Time: 11/21/21  5:58 PM   Specimen: Anterior Nasal Swab  Result Value Ref Range Status   SARS Coronavirus 2 by RT PCR NEGATIVE NEGATIVE Final    Comment: (NOTE) SARS-CoV-2 target nucleic acids are NOT DETECTED.  The SARS-CoV-2 RNA is generally detectable in upper respiratory specimens during the acute phase of infection. The lowest concentration of SARS-CoV-2 viral copies this assay can detect is 138 copies/mL. A negative result does not preclude SARS-Cov-2 infection and should not be used as the sole basis for treatment or other patient management decisions. A negative result may occur with  improper  specimen collection/handling, submission of specimen other than nasopharyngeal swab, presence of viral mutation(s) within the areas targeted by this assay, and inadequate number of viral copies(<138 copies/mL). A negative result must be combined with clinical observations, patient history, and epidemiological information. The expected result is Negative.  Fact Sheet for Patients:  EntrepreneurPulse.com.au  Fact Sheet for Healthcare Providers:  IncredibleEmployment.be  This test is no t yet approved or cleared by the [place] FDA and  has been authorized for detection and/or diagnosis of SARS-CoV-2 by FDA under an Emergency Use Authorization (EUA). This EUA will remain  in effect (meaning this test can be used) for the duration of the COVID-19 declaration under Section 564(b)(1) of the Act, 21 U.S.C.section 360bbb-3(b)(1), unless the authorization is terminated  or revoked  sooner.       Influenza A by PCR NEGATIVE NEGATIVE Final   Influenza B by PCR NEGATIVE NEGATIVE Final    Comment: (NOTE) The Xpert Xpress SARS-CoV-2/FLU/RSV plus assay is intended as an aid in the diagnosis of influenza from Nasopharyngeal swab specimens and should not be used as a sole basis for treatment. Nasal washings and aspirates are unacceptable for Xpert Xpress SARS-CoV-2/FLU/RSV testing.  Fact Sheet for Patients: EntrepreneurPulse.com.au  Fact Sheet for Healthcare Providers: IncredibleEmployment.be  This test is not yet approved or cleared by the [place] FDA and has been authorized for detection and/or diagnosis of SARS-CoV-2 by FDA under an Emergency Use Authorization (EUA). This EUA will remain in effect (meaning this test can be used) for the duration of the COVID-19 declaration under Section 564(b)(1) of the Act, 21 U.S.C. section 360bbb-3(b)(1), unless the authorization is terminated or revoked.  Performed at [facility], [address] [place], [place] [address]   MRSA Next Gen by PCR, Nasal     Status: None   Collection Time: 11/21/21 11:28 PM   Specimen: Nasal Mucosa; Nasal Swab  Result Value Ref Range Status   MRSA by PCR Next Gen NOT DETECTED NOT DETECTED Final    Comment: (NOTE) The GeneXpert MRSA Assay (FDA approved for NASAL specimens only), is one component of a comprehensive MRSA colonization surveillance program. It is not intended to diagnose MRSA infection nor to guide or monitor treatment for MRSA infections. Test performance is not FDA approved in patients less than 64 years old. Performed at [facility], [place] [address] [place], [place] [address]   Culture, blood (Routine X 2) w Reflex to ID Panel     Status: Abnormal   Collection Time: 11/22/21 12:54 AM   Specimen: BLOOD LEFT ARM  Result Value Ref Range Status   Specimen Description BLOOD LEFT ARM  Final   Special Requests   Final     BOTTLES DRAWN AEROBIC AND ANAEROBIC Blood Culture results may not be optimal due to an inadequate volume of blood received in culture bottles   Culture  Setup Time   Final    GRAM POSITIVE COCCI ANAEROBIC BOTTLE ONLY CRITICAL RESULT CALLED TO, READ BACK BY AND VERIFIED WITH: V BRYK,PHARMD@0531  11/23/21 [place]    Culture (A)  Final    STAPHYLOCOCCUS EPIDERMIDIS THE SIGNIFICANCE OF ISOLATING THIS ORGANISM FROM A SINGLE SET OF BLOOD CULTURES WHEN MULTIPLE SETS ARE DRAWN IS UNCERTAIN. PLEASE NOTIFY THE MICROBIOLOGY DEPARTMENT WITHIN ONE WEEK IF SPECIATION AND SENSITIVITIES ARE REQUIRED. Performed at [facility], [place] [address] [place], [place] [address]    Report Status 11/25/2021 FINAL  Final  Blood Culture ID Panel (Reflexed)  Status: Abnormal   Collection Time: 11/22/21 12:54 AM  Result Value Ref Range Status   Enterococcus faecalis NOT DETECTED NOT DETECTED Final   Enterococcus Faecium NOT DETECTED NOT DETECTED Final   Listeria monocytogenes NOT DETECTED NOT DETECTED Final   Staphylococcus species DETECTED (A) NOT DETECTED Final    Comment: CRITICAL RESULT CALLED TO, READ BACK BY AND VERIFIED WITH: V BRYK,PHARMD@0530  11/23/21 [place]    Staphylococcus aureus (BCID) NOT DETECTED NOT DETECTED Final   Staphylococcus epidermidis DETECTED (A) NOT DETECTED Final    Comment: Methicillin (oxacillin) resistant coagulase negative staphylococcus. Possible blood culture contaminant (unless isolated from more than one blood culture draw or clinical case suggests pathogenicity). No antibiotic treatment is indicated for blood  culture contaminants. CRITICAL RESULT CALLED TO, READ BACK BY AND VERIFIED WITH: V BRYK,PHARMD@0533  11/23/21 [place]    Staphylococcus lugdunensis NOT DETECTED NOT DETECTED Final   Streptococcus species NOT DETECTED NOT DETECTED Final   Streptococcus agalactiae NOT DETECTED NOT DETECTED Final   Streptococcus pneumoniae NOT DETECTED NOT DETECTED Final   Streptococcus pyogenes NOT  DETECTED NOT DETECTED Final   A.calcoaceticus-baumannii NOT DETECTED NOT DETECTED Final   Bacteroides fragilis NOT DETECTED NOT DETECTED Final   Enterobacterales NOT DETECTED NOT DETECTED Final   Enterobacter cloacae complex NOT DETECTED NOT DETECTED Final   Escherichia coli NOT DETECTED NOT DETECTED Final   Klebsiella aerogenes NOT DETECTED NOT DETECTED Final   Klebsiella oxytoca NOT DETECTED NOT DETECTED Final   Klebsiella pneumoniae NOT DETECTED NOT DETECTED Final   Proteus species NOT DETECTED NOT DETECTED Final   Salmonella species NOT DETECTED NOT DETECTED Final   Serratia marcescens NOT DETECTED NOT DETECTED Final   Haemophilus influenzae NOT DETECTED NOT DETECTED Final   Neisseria meningitidis NOT DETECTED NOT DETECTED Final   Pseudomonas aeruginosa NOT DETECTED NOT DETECTED Final   Stenotrophomonas maltophilia NOT DETECTED NOT DETECTED Final   Candida albicans NOT DETECTED NOT DETECTED Final   Candida auris NOT DETECTED NOT DETECTED Final   Candida glabrata NOT DETECTED NOT DETECTED Final   Candida krusei NOT DETECTED NOT DETECTED Final   Candida parapsilosis NOT DETECTED NOT DETECTED Final   Candida tropicalis NOT DETECTED NOT DETECTED Final   Cryptococcus neoformans/gattii NOT DETECTED NOT DETECTED Final   Methicillin resistance mecA/C DETECTED (A) NOT DETECTED Final    Comment: CRITICAL RESULT CALLED TO, READ BACK BY AND VERIFIED WITH: V BRYK,PHARMD@0532  11/23/21 [place] Performed at [facility], [address] [place], [place] [address]   Culture, blood (Routine X 2) w Reflex to ID Panel     Status: None   Collection Time: 11/23/21 10:52 AM   Specimen: BLOOD  Result Value Ref Range Status   Specimen Description BLOOD BLOOD RIGHT HAND  Final   Special Requests   Final    BOTTLES DRAWN AEROBIC AND ANAEROBIC Blood Culture adequate volume   Culture   Final    NO GROWTH 5 DAYS Performed at [facility], [place] [address], [place], [place] [address]    Report  Status 11/28/2021 FINAL  Final  Culture, blood (Routine X 2) w Reflex to ID Panel     Status: None   Collection Time: 11/23/21 10:53 AM   Specimen: BLOOD  Result Value Ref Range Status   Specimen Description BLOOD SITE NOT SPECIFIED  Final   Special Requests   Final    BOTTLES DRAWN AEROBIC AND ANAEROBIC Blood Culture adequate volume   Culture   Final    NO GROWTH  5 DAYS Performed at [facility], [place] [address], [place], [place] [address]    Report Status 11/28/2021 FINAL  Final         Radiology Studies: No results found.      Scheduled Meds:  amiodarone  200 mg Oral Daily   amLODipine  10 mg Oral Daily   calcium acetate  1,334 mg Oral TID WC   carvedilol  25 mg Oral BID   Chlorhexidine Gluconate Cloth  6 each Topical Daily   cloNIDine  0.3 mg Oral Q8H   darbepoetin (ARANESP) injection - DIALYSIS  150 mcg Intravenous Q Tue-HD   dorzolamide-timolol  1 drop Right Eye BID   heparin injection (subcutaneous)  5,000 Units Subcutaneous Q8H   irbesartan  300 mg Oral Daily   levETIRAcetam  250 mg Oral Q T,Th,Sa-HD   levETIRAcetam  500 mg Oral BID   pantoprazole  40 mg Oral Daily   pneumococcal 20-valent conjugate vaccine  0.5 mL Intramuscular Tomorrow-1000   prednisoLONE acetate  1 drop Left Eye Q4H while awake   senna-docusate  1 tablet Oral BID   tamsulosin  0.4 mg Oral Daily   Continuous Infusions:     LOS: 10 days   Time spent: 68min  Jazlynne Milliner C Param Capri, DO [facility]  If 7PM-7AM, please contact night-coverage [email or web address]  12/01/2021, 7:59 AM

## 2021-12-01 NOTE — Progress Notes (Signed)
SLP Cancellation Note  Patient Details Name: [name]. MRN: [id number] DOB: Sep 25, 1970   Cancelled treatment:       Reason Eval/Treat Not Completed: Patient at procedure or test/unavailable (HD). Will f/u as able.    Osie Bond., M.A. [place] Office [phone]  Secure chat preferred  12/01/2021, 8:47 AM

## 2021-12-02 DIAGNOSIS — I161 Hypertensive emergency: Secondary | ICD-10-CM | POA: Diagnosis not present

## 2021-12-02 DIAGNOSIS — I629 Nontraumatic intracranial hemorrhage, unspecified: Secondary | ICD-10-CM | POA: Diagnosis not present

## 2021-12-02 MED ORDER — DARBEPOETIN ALFA 150 MCG/0.3ML IJ SOSY
150.0000 ug | PREFILLED_SYRINGE | Freq: Once | INTRAMUSCULAR | Status: AC
  Administered 2021-12-02: 150 ug via SUBCUTANEOUS
  Filled 2021-12-02: qty 0.3

## 2021-12-02 NOTE — Progress Notes (Signed)
PT notified RN of SBP still reading high at 171 even after scheduled meds given. MD notified, PRN hydralazine given. No new orders at this time

## 2021-12-02 NOTE — TOC Initial Note (Signed)
Transition of Care (TOC) - Initial/Assessment Note    Patient Details  Name: [name]. MRN: [id number] Date of Birth: Sep 20, 1970  Transition of Care [facility]) CM/SW Contact:    Pollie Friar, RN Phone Number: 12/02/2021, 3:22 PM  Clinical Narrative:                 Per CIR pt has received a denial for CIR. CM met with the patient to see if her and his wife prefer home with [facility] vs SNF rehab. Per pt he has not been able to discuss discharge plans with his spouse as she is at work.  TOC will f/u in the am to see what they have decided.     Barriers to Discharge: Continued Medical Work up   Patient Goals and CMS Choice   CMS Medicare.gov Compare Post Acute Care list provided to:: Patient Choice offered to / list presented to : Patient, Spouse  Expected Discharge Plan and Services   In-house Referral: Clinical Social Work Discharge Planning Services: CM Consult   Living arrangements for the past 2 months: Single Family Home                                      Prior Living Arrangements/Services Living arrangements for the past 2 months: Single Family Home Lives with:: Spouse Patient language and need for interpreter reviewed:: Yes Do you feel safe going back to the place where you live?: Yes      Need for Family Participation in Patient Care: Yes (Comment) Care giver support system in place?: No (comment)   Criminal Activity/Legal Involvement Pertinent to Current Situation/Hospitalization: No - Comment as needed  Activities of Daily Living Home Assistive Devices/Equipment: None, Walker (specify type) ADL Screening (condition at time of admission) Patient's cognitive ability adequate to safely complete daily activities?: Yes Is the patient deaf or have difficulty hearing?: No Does the patient have difficulty seeing, even when wearing glasses/contacts?: No Does the patient have difficulty concentrating, remembering, or making decisions?: No Patient able to  express need for assistance with ADLs?: Yes Does the patient have difficulty dressing or bathing?: No Independently performs ADLs?: No Does the patient have difficulty walking or climbing stairs?: No Weakness of Legs: None Weakness of Arms/Hands: None  Permission Sought/Granted                  Emotional Assessment Appearance:: Appears stated age Attitude/Demeanor/Rapport: Engaged Affect (typically observed): Accepting Orientation: : Oriented to Self, Oriented to Place, Oriented to  Time, Oriented to Situation   Psych Involvement: No (comment)  Admission diagnosis:  ICH (intracerebral hemorrhage) ([place]) [I61.9] Intracranial bleed ([place]) [I62.9] Hypertensive emergency [I16.1] Patient Active Problem List   Diagnosis Date Noted   Pressure injury of skin 11/29/2021   Hypertensive emergency    Bacteremia associated with intravascular line ([place])    Intracranial bleed ([place]) 11/21/2021   ICH (intracerebral hemorrhage) ([place]) 11/21/2021   Seizure ([place]) 07/26/2021   Seasonal allergic rhinitis 07/08/2021   Immunization due 07/08/2021   Hyperglycemia due to type 2 diabetes mellitus ([place]) 07/08/2021   Diabetic renal disease ([place]) 07/08/2021   Aspiration pneumonia ([place]) 06/27/2021   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 difficile colitis 05/05/2021   Cerebral ischemic stroke due to global hypoperfusion with watershed infarct [facility]) 04/24/2021   Peritonitis ([place]) 04/22/2021   Hypertensive crisis 04/21/2021   Acute Bil  CVAs of Bil Frontal amd Rt  Parietal  in [employer] [employer] 04/21/2021   Exposure of orbital implant 03/16/2021   CVA (cerebral vascular accident) ([place]) 02/13/2021   Steal syndrome as complication of dialysis access ([place]) 02/06/2021   Folate deficiency 02/06/2021   Hyperkalemia 02/05/2021   Hyperphosphatemia 02/05/2021   Hypermagnesemia 02/05/2021   Coronary artery disease 02/05/2021   Hypernatremia 02/05/2021   Osteomyelitis ([place]) 02/05/2021   Wound infection 02/04/2021    Infected wound 01/29/2021   Wound dehiscence, surgical, initial encounter 01/29/2021   ESRD on hemodialysis [facility])    Prolonged Q-T interval on ECG    Anemia due to chronic kidney disease, on chronic dialysis ([place])    Atrial fibrillation with RVR ([place]) 12/08/2020   Finger amputation, 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 43/32/9518   Atrial fibrillation with rapid ventricular response ([place])    Gangrene of finger of right hand ([place]) 11/25/2020   Postural dizziness with presyncope 11/24/2020   Elevated troponin 11/24/2020   Left leg cellulitis 11/24/2020   Frequent falls 11/24/2020   Osteomyelitis of finger of right hand ([place]) 11/24/2020   Paroxysmal atrial fibrillation ([place]) 11/24/2020   Hypotension 11/24/2020   NSTEMI (non-ST elevated myocardial infarction) ([place]) 11/24/2020   Abnormal gait 08/19/2020   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[place]) 08/19/2020   Anxiety state 08/19/2020   Callosity 08/19/2020   ED (erectile dysfunction) of organic origin 08/19/2020   Heart failure ([place]) 08/19/2020   Long term (current) use of insulin ([place]) 08/19/2020   Myalgia 08/19/2020   Obstructive uropathy 08/19/2020   Reactive depression 08/19/2020   CAD/ H/o prior STEMI 02/2020 s/p DES to LAD 08/19/2020   ESRD (end stage renal disease) ([place]) 08/19/2020   Blindness of left eye 08/15/2020   Combined forms of age-related cataract of right eye 04/17/2020   Supraventricular tachycardia 02/26/2020   Acute anterior wall MI ([place])    Benign prostatic hyperplasia with lower urinary tract symptoms 11/07/2019   Fluid overload, unspecified 08/15/2019   Hypertension secondary to other renal disorders 07/20/2019   NVG (neovascular glaucoma), left, 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   Anemia 06/05/2019   Aftercare including intermittent dialysis ([place]) 05/15/2019   Moderate protein-calorie malnutrition ([place]) 05/14/2019   Anaphylactic reaction due to adverse effect of correct drug or medicament properly administered, initial encounter  05/08/2019   Chronic anemia 05/07/2019   Chest pain, unspecified 05/07/2019   Coagulation defect, unspecified ([place]) 05/07/2019   Disorder of phosphorus metabolism, unspecified 05/07/2019   Encounter for fitting and adjustment of extracorporeal dialysis catheter ([place]) 05/07/2019   Iron deficiency anemia, unspecified 05/07/2019   Pain, unspecified 05/07/2019   Pruritus, unspecified 05/07/2019   Dizziness 84/16/6063   Metabolic acidosis 01/60/1093   Syncope 04/06/2019   Urinary retention 04/06/2019   Hypertensive chronic kidney disease with stage 1 through stage 4 chronic kidney disease, or unspecified chronic kidney disease 11/28/2018   Edema 11/28/2018   Hypokalemia 11/28/2018   Hypo-osmolality and hyponatremia 11/28/2018   Proteinuria 11/28/2018   Secondary hyperparathyroidism of renal origin ([place]) 11/28/2018   Lymphedema 01/09/2018   Chronic Grade 2 diastolic heart failure/EF 55 to 60% 12/13/2017   Vomiting 11/28/2017   Acute on chronic heart failure ([place]) 11/23/2017   Diabetic retinopathy ([place]) 07/05/2017   Uncontrolled type 2 diabetes mellitus with hyperglycemia, with long-term current use of insulin ([place]) 04/20/2016   Osteomyelitis of toe of left foot ([place]) 03/15/2016   Acute renal failure with tubular necrosis ([place]) 06/23/2015   Diarrhea 06/23/2015   Mixed hyperlipidemia    Essential hypertension, benign    Heart palpitations  05/06/2015   Sepsis ([place]) 03/07/2015   PCP:  [name], MD Pharmacy:   [place], [place] - [address] [place] [address] Phone: [phone] Fax: [place], [place] [address] AT [place]. [place] [address] Phone: [phone] Fax: [phone]     Social Determinants of Health (SDOH) Interventions Food Insecurity Interventions: Intervention Not Indicated Housing Interventions: Intervention  Not Indicated Transportation Interventions: Intervention Not Indicated Utilities Interventions: Intervention Not Indicated  Readmission Risk Interventions    07/27/2021   11:49 AM 11/27/2020    3:06 PM 02/28/2020    1:31 PM  Readmission Risk Prevention Plan  Transportation Screening Complete Complete Complete  PCP or Specialist Appt within 3-5 Days  Complete Complete  HRI or [place]  Complete Complete  Social Work Consult for [place] Planning/Counseling  Complete Complete  Palliative Care Screening  Not Applicable Not Applicable  Medication Review Press photographer) Complete Complete Complete  SW Recovery Care/Counseling Consult Complete    [place] Not Applicable

## 2021-12-02 NOTE — Plan of Care (Signed)
  Problem: Education: Goal: Knowledge of disease or condition will improve Outcome: Progressing Goal: Knowledge of secondary prevention will improve (SELECT ALL) Outcome: Progressing Goal: Knowledge of patient specific risk factors will improve (INDIVIDUALIZE FOR PATIENT) Outcome: Progressing Goal: Individualized Educational Video(s) Outcome: Progressing   Problem: Education: Goal: Knowledge of General Education information will improve Description: Including pain rating scale, medication(s)/side effects and non-pharmacologic comfort measures Outcome: Progressing   Problem: Health Behavior/Discharge Planning: Goal: Ability to manage health-related needs will improve Outcome: Progressing   Problem: Clinical Measurements: Goal: Ability to maintain clinical measurements within normal limits will improve Outcome: Progressing Goal: Will remain free from infection Outcome: Progressing Goal: Diagnostic test results will improve Outcome: Progressing Goal: Respiratory complications will improve Outcome: Progressing Goal: Cardiovascular complication will be avoided Outcome: Progressing   Problem: Activity: Goal: Risk for activity intolerance will decrease Outcome: Progressing   Problem: Nutrition: Goal: Adequate nutrition will be maintained Outcome: Progressing   Problem: Coping: Goal: Level of anxiety will decrease Outcome: Progressing   Problem: Elimination: Goal: Will not experience complications related to bowel motility Outcome: Progressing   Problem: Pain Managment: Goal: General experience of comfort will improve Outcome: Progressing   Problem: Safety: Goal: Ability to remain free from injury will improve Outcome: Progressing   Problem: Skin Integrity: Goal: Risk for impaired skin integrity will decrease Outcome: Progressing

## 2021-12-02 NOTE — Progress Notes (Addendum)
PT Cancellation Note  Patient Details Name: [name]. MRN: [id number] DOB: 1970/12/28   Cancelled Treatment:    Reason Eval/Treat Not Completed: Medical issues which prohibited therapy  BP in right calf reading 231/54. RN made aware and in to give bP meds. Will attempt to see later if bP allows.   Addendum-11:16- Supine BP rt calf 171/46. RN made aware and is contacting primary team. I had contacted neurology and they do still want [place] to use max SBP<160 as his goal even when taking BP in his calf (due to bil UE fistulas).   [place]  Office [phone]   Rexanne Mano 12/02/2021, 10:09 AM

## 2021-12-02 NOTE — Progress Notes (Signed)
Speech Language Pathology Treatment: Cognitive-Linquistic  Patient Details Name: [name]. MRN: [id number] DOB: 01/29/1971 Today's Date: 12/02/2021 Time: 8182-9937 SLP Time Calculation (min) (ACUTE ONLY): 9 min  Assessment / Plan / Recommendation Clinical Impression  Pt asleep upon entry but roused upon reposistioning. Pt was initially agreeable session and participated briefly before before refusing further questions. Session targeted functional cognitive skills including problem solving and mental math. The pt answered 4/4 problem solving and 3/3 simple mental math questions with minimal cueing. [name] speech continues to present as within defined limits. Pt reports controlling his finances and medications. No family member present to provide insight into pt's baseline cognitive functioning, however, suspect pt is close to or at baseline. Recommend pt receive supervision for functional tasks such as finances/medication management. ST to sign off.   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 Pt was seen by SLP on CIR in March 2023, discharging at a supervision level for cognition. PMH: ESRD TTS, HTN, amputation L big toe, L eye absent, CVA, afib on plavix and Eliquis, Glaucoma, seizure, PVD, CHF      SLP Plan  Continue with current plan of care      Recommendations for follow up therapy are one component of a multi-disciplinary discharge planning process, led by the attending physician.  Recommendations may be updated based on patient status, additional functional criteria and insurance authorization.    Recommendations                   Oral Care Recommendations: Oral care BID Follow Up Recommendations: Acute inpatient rehab (3hours/day) Assistance recommended at discharge: Frequent or constant Supervision/Assistance SLP Visit Diagnosis: Cognitive communication deficit (J69.678) Plan: Continue with  current plan of care          [facility] Student SLP   12/02/2021, 2:28 PM

## 2021-12-02 NOTE — Progress Notes (Signed)
Inpatient Rehabilitation Admissions Coordinator   I have received a denial from [facility] for CIR admit. I spoke with patient at bedside and contacted his wife by phone. They are aware of denial and their right to appeal. She will discuss with him concerning SNF vs Home with Home health and caregiver supports. I have alerted acute team and TOC. We will sign off at this time.  Danne Baxter, RN, MSN Rehab Admissions Coordinator [phone] 12/02/2021 11:06 AM

## 2021-12-02 NOTE — Progress Notes (Signed)
Patient ID: [name]., male   DOB: February 18, 1971, 51 y.o.   MRN: [id number] S: tolerated HD well yesterday. O:BP 129/75   Pulse 63   Temp 98.2 F (36.8 C) (Oral)   Resp 18   Ht 6\' 3"  (1.905 m)   Wt 78.3 kg   SpO2 97%   BMI 21.58 kg/m   Intake/Output Summary (Last 24 hours) at 12/02/2021 1105 Last data filed at 12/01/2021 1217 Gross per 24 hour  Intake --  Output 0 ml  Net 0 ml   Intake/Output: No intake/output data recorded.  Intake/Output this shift:  No intake/output data recorded. Weight change:  Gen: NAD CVS: RRR Resp: CTA Abd: +BS, soft, NT/ND Ext: no edema LUE AVF +T/B  Recent Labs  Lab 11/26/21 0327 11/27/21 0429 12/01/21 0929  NA 132* 133* 134*  K 4.0 3.9 3.5  CL 96* 93* 96*  CO2 19* 22 27  GLUCOSE 99 83 109*  BUN 46* 46* 38*  CREATININE 10.44* 10.28* 8.21*  ALBUMIN  --   --  2.5*  CALCIUM 7.8* 9.0 8.2*  PHOS  --   --  3.7   Liver Function Tests: Recent Labs  Lab 12/01/21 0929  ALBUMIN 2.5*   No results for input(s): "LIPASE", "AMYLASE" in the last 168 hours. No results for input(s): "AMMONIA" in the last 168 hours. CBC: Recent Labs  Lab 11/26/21 0327 11/27/21 0429 11/28/21 0334 11/29/21 0643 12/01/21 0929  WBC 9.4 7.8 7.4 7.7 6.1  HGB 9.1* 9.0* 8.8* 9.4* 9.3*  HCT 30.4* 30.2* 29.2* 30.4* 30.7*  MCV 73.4* 73.5* 73.7* 73.8* 74.2*  PLT 129* 159 200 216 268   Cardiac Enzymes: No results for input(s): "CKTOTAL", "CKMB", "CKMBINDEX", "TROPONINI" in the last 168 hours. CBG: Recent Labs  Lab 11/29/21 1003 12/01/21 0309  GLUCAP 116* 119*    Iron Studies: No results for input(s): "IRON", "TIBC", "TRANSFERRIN", "FERRITIN" in the last 72 hours. Studies/Results: No results found.  amiodarone  200 mg Oral Daily   amLODipine  10 mg Oral Daily   calcium acetate  1,334 mg Oral TID WC   carvedilol  25 mg Oral BID   Chlorhexidine Gluconate Cloth  6 each Topical Daily   cloNIDine  0.3 mg Oral Q8H   darbepoetin (ARANESP) injection -  DIALYSIS  150 mcg Intravenous Q Tue-HD   dorzolamide-timolol  1 drop Right Eye BID   heparin injection (subcutaneous)  5,000 Units Subcutaneous Q8H   irbesartan  300 mg Oral Daily   levETIRAcetam  250 mg Oral Q T,Th,Sa-HD   levETIRAcetam  500 mg Oral BID   pantoprazole  40 mg Oral Daily   pneumococcal 20-valent conjugate vaccine  0.5 mL Intramuscular Tomorrow-1000   prednisoLONE acetate  1 drop Left Eye Q4H while awake   senna-docusate  1 tablet Oral BID   tamsulosin  0.4 mg Oral Daily    BMET    Component Value Date/Time   NA 134 (L) 12/01/2021 0929   K 3.5 12/01/2021 0929   CL 96 (L) 12/01/2021 0929   CO2 27 12/01/2021 0929   GLUCOSE 109 (H) 12/01/2021 0929   BUN 38 (H) 12/01/2021 0929   CREATININE 8.21 (H) 12/01/2021 0929   CALCIUM 8.2 (L) 12/01/2021 0929   GFRNONAA 7 (L) 12/01/2021 0929   GFRAA 5 (L) 08/27/2019 1540   CBC    Component Value Date/Time   WBC 6.1 12/01/2021 0929   RBC 4.14 (L) 12/01/2021 0929   HGB 9.3 (L) 12/01/2021 0929   HCT  30.7 (L) 12/01/2021 0929   PLT 268 12/01/2021 0929   MCV 74.2 (L) 12/01/2021 0929   MCH 22.5 (L) 12/01/2021 0929   MCHC 30.3 12/01/2021 0929   RDW 20.0 (H) 12/01/2021 0929   LYMPHSABS 1.5 10/24/2021 0805   MONOABS 0.6 10/24/2021 0805   EOSABS 0.3 10/24/2021 0805   BASOSABS 0.0 10/24/2021 0805    Dialysis Orders: Center: [facility]  on TTS . EDW 87 kg HD Bath 2K/2.5Ca  Time 3:30 Heparin . 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  Antithrombotic therapy contraindicated with ICH. ESRD: We will continue TTS schedule via right IJ TDC; next dialysis due 10/10.  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 Anemia: Hgb currently lower without any overt losses, begin ESA.   CKD-MBD: Calcium level acceptable, will monitor phosphorus trends. Nutrition: Continue current renal diet/fluid restriction with monitoring albumin levels to decide on need for nutritional supplementation. Hypertension/Volume: Blood pressure improving on amlodipine, carvedilol, irbesartan and clonidine.  Has had some orthostatic hypotension which interferes with cooperation with PT/OT.  Kept even with HD on 12/01/21 and did better.  Not taking much in by mouth.  Limit UF.   Donetta Potts, MD [facility]

## 2021-12-02 NOTE — Progress Notes (Signed)
PROGRESS NOTE   [name].  RXV:[id number] DOB: 06-03-1970 DOA: 11/21/2021 PCP: [name], MD  Brief Narrative:  [name]. is a 51 y.o. male with a history of stroke, atrial fibrillation, NSTEMI and CHF with chronic on Plavix and apixaban who has been having general malaise for the past few days.  Also a cough.  He has had some nausea and vomiting over the past 2 days, but only mild headache.  When he presented to the emergency department, his blood pressure was in the 200s.  Given the nausea and vomiting and severe hypertension, the ER physician at [facility] decided to perform a head CT which demonstrates a fourth ventricle bleed. Transitioned to [facility] care 11/27/21.  Assessment & Plan:   Interventricular hemorrhage - bilateral lateral ventricles/4th ventricle without hydrocephaly Complicated by uncontrolled hypertension Confirmed on imaging -  CTA head & neck No evidence of aneurysm, no LVO or high grade stenosis Follow up CT 10/1, 10/4 stable IVH 2D Echo EF 65 to 70%, large pericardial effusion - no evidence of tamponade LDL 34 HgbA1c 4.5 VTE prophylaxis - SCDs Previously on plavix/eliquis - on hold now given active IVH  Neuro signing off  Hypertensive emergency Home meds:  carvedilol 25 mg BID, amlodipine 5 mg daily, clonidine  0.1 mg TID PRN Currently on amlodipine 10, coreg 25, clonidine 0.3 three times daily, irbesartan Previously requiring Cleviprex - now off Keep SBP <160 - Long-term BP goal normotensive  History of seizure Continue keppra;   Fevers, resolving Unclear etiology -ID was consulted.  Antibiotics were held.  Thought to be central in origin.  Fevers appear to be subsiding.  Cultures of been negative. MRSE considered contaminant  Hyperlipidemia Home meds: atorvastatin 80 mg daily LDL 34, goal < 70 Resume statin at DC   ESRD on HD Patient receives dialysis T, Th, Sat Nephrology continues to follow   Large pericardial effusion Found  on both CT chest and 2D echo Likely due to ESRD - hoping volume management will improve effusion No tamponade on 2D echo Hemodynamically stable Close monitoring Will need repeat echocardiogram in the next few weeks.   Hiccup- Resolved Not able to give thorazine due to QT prolongation Not able to give baclofen due to ESRD Now on reglan Q8h PRN  Normocytic anemia Likely anemia of chronic disease.  No evidence of overt bleeding.    Incidental L lung nodule Recommend follow up chest CT every 6-12 months to monitor nodules  DVT prophylaxis: heparin Code Status: Full Family Communication: None present  Status is: INpt  Dispo: The patient is from: Home              Anticipated d/c is to: CIR was declined by insurance.  Family to decide regarding SNF versus home with home health.           Patient remains medically stable for discharge  Consultants:  Neuro, nephro  Procedures:  None  Antimicrobials:  Discontinued 10/6   Subjective: Patient is stable.  Denies any complaints this morning.  Specifically no chest pain or shortness of breath.   Objective: Vitals:   12/01/21 2328 12/02/21 0327 12/02/21 0900 12/02/21 0906  BP: (!) 152/76 (!) 173/81 129/75 129/75  Pulse: 64 64 63 63  Resp: 18 20 18    Temp: 98.4 F (36.9 C) 98.4 F (36.9 C) 98.2 F (36.8 C)   TempSrc: Oral Oral Oral   SpO2: 98% 98% 97% 97%  Weight:      Height:  Intake/Output Summary (Last 24 hours) at 12/02/2021 1129 Last data filed at 12/01/2021 1217 Gross per 24 hour  Intake --  Output 0 ml  Net 0 ml    Filed Weights   11/28/21 0827 11/28/21 1243 12/01/21 1217  Weight: 79.2 kg 78.2 kg 78.3 kg    Examination:  General appearance: Awake alert.  In no distress Resp: Clear to auscultation bilaterally.  Normal effort Cardio: S1-S2 is normal regular.  No S3-S4.  No rubs murmurs or bruit GI: Abdomen is soft.  Nontender nondistended.  Bowel sounds are present normal.  No masses  organomegaly Extremities: No edema.     Data Reviewed: I have personally reviewed following labs and imaging studies  CBC: Recent Labs  Lab 11/26/21 0327 11/27/21 0429 11/28/21 0334 11/29/21 0643 12/01/21 0929  WBC 9.4 7.8 7.4 7.7 6.1  HGB 9.1* 9.0* 8.8* 9.4* 9.3*  HCT 30.4* 30.2* 29.2* 30.4* 30.7*  MCV 73.4* 73.5* 73.7* 73.8* 74.2*  PLT 129* 159 200 216 627    Basic Metabolic Panel: Recent Labs  Lab 11/26/21 0327 11/27/21 0429 12/01/21 0929  NA 132* 133* 134*  K 4.0 3.9 3.5  CL 96* 93* 96*  CO2 19* 22 27  GLUCOSE 99 83 109*  BUN 46* 46* 38*  CREATININE 10.44* 10.28* 8.21*  CALCIUM 7.8* 9.0 8.2*  PHOS  --   --  3.7    GFR: Estimated Creatinine Clearance: 11.5 mL/min (A) (by C-G formula based on SCr of 8.21 mg/dL (H)). Liver Function Tests: Recent Labs  Lab 12/01/21 0929  ALBUMIN 2.5*     CBG: Recent Labs  Lab 11/29/21 1003 12/01/21 0309  GLUCAP 116* 119*      Recent Results (from the past 240 hour(s))  Culture, blood (Routine X 2) w Reflex to ID Panel     Status: None   Collection Time: 11/23/21 10:52 AM   Specimen: BLOOD  Result Value Ref Range Status   Specimen Description BLOOD BLOOD RIGHT HAND  Final   Special Requests   Final    BOTTLES DRAWN AEROBIC AND ANAEROBIC Blood Culture adequate volume   Culture   Final    NO GROWTH 5 DAYS Performed at [facility], [place] [address], [place], [place] [address]    Report Status 11/28/2021 FINAL  Final  Culture, blood (Routine X 2) w Reflex to ID Panel     Status: None   Collection Time: 11/23/21 10:53 AM   Specimen: BLOOD  Result Value Ref Range Status   Specimen Description BLOOD SITE NOT SPECIFIED  Final   Special Requests   Final    BOTTLES DRAWN AEROBIC AND ANAEROBIC Blood Culture adequate volume   Culture   Final    NO GROWTH 5 DAYS Performed at [facility], [place] [address] [place], [place] [address]    Report Status 11/28/2021 FINAL  Final         Radiology  Studies: No results found.      Scheduled Meds:  amiodarone  200 mg Oral Daily   amLODipine  10 mg Oral Daily   calcium acetate  1,334 mg Oral TID WC   carvedilol  25 mg Oral BID   Chlorhexidine Gluconate Cloth  6 each Topical Daily   cloNIDine  0.3 mg Oral Q8H   darbepoetin (ARANESP) injection - DIALYSIS  150 mcg Intravenous Q Tue-HD   dorzolamide-timolol  1 drop Right Eye BID   heparin injection (subcutaneous)  5,000 Units Subcutaneous Q8H  irbesartan  300 mg Oral Daily   levETIRAcetam  250 mg Oral Q T,Th,Sa-HD   levETIRAcetam  500 mg Oral BID   pantoprazole  40 mg Oral Daily   pneumococcal 20-valent conjugate vaccine  0.5 mL Intramuscular Tomorrow-1000   prednisoLONE acetate  1 drop Left Eye Q4H while awake   senna-docusate  1 tablet Oral BID   tamsulosin  0.4 mg Oral Daily   Continuous Infusions:     LOS: 11 days    Bonnielee Haff,  [facility]  If 7PM-7AM, please contact night-coverage [email or web address]  12/02/2021, 11:29 AM

## 2021-12-03 DIAGNOSIS — I629 Nontraumatic intracranial hemorrhage, unspecified: Secondary | ICD-10-CM | POA: Diagnosis not present

## 2021-12-03 LAB — CBC
HCT: 29.4 % — ABNORMAL LOW (ref 39.0–52.0)
Hemoglobin: 9 g/dL — ABNORMAL LOW (ref 13.0–17.0)
MCH: 22.4 pg — ABNORMAL LOW (ref 26.0–34.0)
MCHC: 30.6 g/dL (ref 30.0–36.0)
MCV: 73.3 fL — ABNORMAL LOW (ref 80.0–100.0)
Platelets: 244 10*3/uL (ref 150–400)
RBC: 4.01 MIL/uL — ABNORMAL LOW (ref 4.22–5.81)
RDW: 20.4 % — ABNORMAL HIGH (ref 11.5–15.5)
WBC: 4.3 10*3/uL (ref 4.0–10.5)
nRBC: 0 % (ref 0.0–0.2)

## 2021-12-03 LAB — RENAL FUNCTION PANEL
Albumin: 2.4 g/dL — ABNORMAL LOW (ref 3.5–5.0)
Anion gap: 11 (ref 5–15)
BUN: 34 mg/dL — ABNORMAL HIGH (ref 6–20)
CO2: 27 mmol/L (ref 22–32)
Calcium: 8.2 mg/dL — ABNORMAL LOW (ref 8.9–10.3)
Chloride: 95 mmol/L — ABNORMAL LOW (ref 98–111)
Creatinine, Ser: 7.5 mg/dL — ABNORMAL HIGH (ref 0.61–1.24)
GFR, Estimated: 8 mL/min — ABNORMAL LOW (ref >60)
Glucose, Bld: 131 mg/dL — ABNORMAL HIGH (ref 70–99)
Phosphorus: 3.9 mg/dL (ref 2.5–4.6)
Potassium: 3.3 mmol/L — ABNORMAL LOW (ref 3.5–5.1)
Sodium: 133 mmol/L — ABNORMAL LOW (ref 135–145)

## 2021-12-03 LAB — GLUCOSE, CAPILLARY: Glucose-Capillary: 150 mg/dL — ABNORMAL HIGH (ref 70–99)

## 2021-12-03 MED ORDER — ANTICOAGULANT SODIUM CITRATE 4% (200MG/5ML) IV SOLN: 5.0000 mL | Status: DC | PRN

## 2021-12-03 MED ORDER — AMLODIPINE BESYLATE 10 MG PO TABS: 10.0000 mg | ORAL_TABLET | Freq: Every day | ORAL | 2 refills | Status: DC

## 2021-12-03 MED ORDER — IRBESARTAN 300 MG PO TABS: 300.0000 mg | ORAL_TABLET | Freq: Every day | ORAL | 2 refills | Status: DC

## 2021-12-03 MED ORDER — CLONIDINE HCL 0.3 MG PO TABS: 0.3000 mg | ORAL_TABLET | Freq: Three times a day (TID) | ORAL | 2 refills | Status: DC

## 2021-12-03 MED ORDER — HEPARIN SODIUM (PORCINE) 1000 UNIT/ML DIALYSIS
1000.0000 [IU] | INTRAMUSCULAR | Status: DC | PRN
  Administered 2021-12-03: 1000 [IU]
  Filled 2021-12-03: qty 1

## 2021-12-03 MED ORDER — ALTEPLASE 2 MG IJ SOLR: 2.0000 mg | Freq: Once | INTRAMUSCULAR | Status: DC | PRN

## 2021-12-03 MED ORDER — LEVETIRACETAM IN NACL 1000 MG/100ML IV SOLN
1000.0000 mg | Freq: Once | INTRAVENOUS | Status: AC
  Administered 2021-12-03: 1000 mg via INTRAVENOUS
  Filled 2021-12-03: qty 100

## 2021-12-03 MED ORDER — LEVETIRACETAM 750 MG PO TABS
750.0000 mg | ORAL_TABLET | Freq: Two times a day (BID) | ORAL | Status: DC
  Administered 2021-12-04 – 2021-12-05 (×3): 750 mg via ORAL
  Filled 2021-12-03 (×3): qty 1

## 2021-12-03 NOTE — Procedures (Signed)
I was present at this dialysis session. I have reviewed the session itself and made appropriate changes.   Vital signs in last 24 hours:  Temp:  [98.2 F (36.8 C)-98.8 F (37.1 C)] 98.8 F (37.1 C) (10/12 0723) Pulse Rate:  [52-63] 52 (10/12 0734) Resp:  [13-18] 13 (10/12 0734) BP: (129-168)/(46-88) 149/81 (10/12 0734) SpO2:  [96 %-99 %] 99 % (10/12 0734) Weight:  [78.1 kg] 78.1 kg (10/12 0723) Weight change:  Filed Weights   11/28/21 1243 12/01/21 1217 12/03/21 0723  Weight: 78.2 kg 78.3 kg 78.1 kg    Recent Labs  Lab 12/01/21 0929  NA 134*  K 3.5  CL 96*  CO2 27  GLUCOSE 109*  BUN 38*  CREATININE 8.21*  CALCIUM 8.2*  PHOS 3.7    Recent Labs  Lab 11/29/21 0643 12/01/21 0929 12/03/21 0749  WBC 7.7 6.1 4.3  HGB 9.4* 9.3* 9.0*  HCT 30.4* 30.7* 29.4*  MCV 73.8* 74.2* 73.3*  PLT 216 268 244    Scheduled Meds:  amiodarone  200 mg Oral Daily   amLODipine  10 mg Oral Daily   calcium acetate  1,334 mg Oral TID WC   carvedilol  25 mg Oral BID   Chlorhexidine Gluconate Cloth  6 each Topical Daily   cloNIDine  0.3 mg Oral Q8H   darbepoetin (ARANESP) injection - DIALYSIS  150 mcg Intravenous Q Tue-HD   dorzolamide-timolol  1 drop Right Eye BID   heparin injection (subcutaneous)  5,000 Units Subcutaneous Q8H   irbesartan  300 mg Oral Daily   levETIRAcetam  250 mg Oral Q T,Th,Sa-HD   levETIRAcetam  500 mg Oral BID   pantoprazole  40 mg Oral Daily   pneumococcal 20-valent conjugate vaccine  0.5 mL Intramuscular Tomorrow-1000   prednisoLONE acetate  1 drop Left Eye Q4H while awake   senna-docusate  1 tablet Oral BID   tamsulosin  0.4 mg Oral Daily   Continuous Infusions:  anticoagulant sodium citrate     PRN Meds:.acetaminophen **OR** acetaminophen (TYLENOL) oral liquid 160 mg/5 mL **OR** acetaminophen, alteplase, anticoagulant sodium citrate, heparin, hydrALAZINE, labetalol, metoCLOPramide (REGLAN) injection   Donetta Potts,  MD 12/03/2021, 8:23 AM

## 2021-12-03 NOTE — Plan of Care (Signed)
  Problem: Education: Goal: Knowledge of disease or condition will improve Outcome: Adequate for Discharge Goal: Knowledge of secondary prevention will improve (SELECT ALL) Outcome: Adequate for Discharge Goal: Knowledge of patient specific risk factors will improve (INDIVIDUALIZE FOR PATIENT) Outcome: Adequate for Discharge Goal: Individualized Educational Video(s) Outcome: Adequate for Discharge   Problem: Education: Goal: Knowledge of General Education information will improve Description: Including pain rating scale, medication(s)/side effects and non-pharmacologic comfort measures Outcome: Adequate for Discharge   Problem: Health Behavior/Discharge Planning: Goal: Ability to manage health-related needs will improve Outcome: Adequate for Discharge   Problem: Clinical Measurements: Goal: Ability to maintain clinical measurements within normal limits will improve Outcome: Adequate for Discharge Goal: Will remain free from infection Outcome: Adequate for Discharge Goal: Diagnostic test results will improve Outcome: Adequate for Discharge Goal: Respiratory complications will improve Outcome: Adequate for Discharge Goal: Cardiovascular complication will be avoided Outcome: Adequate for Discharge   Problem: Activity: Goal: Risk for activity intolerance will decrease Outcome: Adequate for Discharge   Problem: Nutrition: Goal: Adequate nutrition will be maintained Outcome: Adequate for Discharge   Problem: Coping: Goal: Level of anxiety will decrease Outcome: Adequate for Discharge   Problem: Elimination: Goal: Will not experience complications related to bowel motility Outcome: Adequate for Discharge   Problem: Pain Managment: Goal: General experience of comfort will improve Outcome: Adequate for Discharge   Problem: Safety: Goal: Ability to remain free from injury will improve Outcome: Adequate for Discharge   Problem: Skin Integrity: Goal: Risk for impaired skin  integrity will decrease Outcome: Adequate for Discharge   Problem: Acute Rehab OT Goals (only OT should resolve) Goal: Pt. Will Perform Grooming Outcome: Adequate for Discharge Goal: Pt. Will Transfer To Toilet Outcome: Adequate for Discharge Goal: OT Additional ADL Goal #1 Outcome: Adequate for Discharge Goal: OT Additional ADL Goal #2 Outcome: Adequate for Discharge   Problem: Acute Rehab PT Goals(only PT should resolve) Goal: Pt will Roll Supine to Side Outcome: Adequate for Discharge Goal: Pt Will Go Supine/Side To Sit Outcome: Adequate for Discharge Goal: Patient Will Perform Sitting Balance Outcome: Adequate for Discharge Goal: Patient Will Transfer Sit To/From Stand Outcome: Adequate for Discharge Goal: Pt Will Transfer Bed To Chair/Chair To Bed Outcome: Adequate for Discharge Goal: Pt Will Ambulate Outcome: Adequate for Discharge

## 2021-12-03 NOTE — Progress Notes (Signed)
PT Cancellation Note  Patient Details Name: [name]. MRN: [id number] DOB: 05-09-1970   Cancelled Treatment:    Reason Eval/Treat Not Completed: Patient at procedure or test/unavailable; patient in HD, will check back when schedule allows.    [name] 12/03/2021, 9:28 AM [name], PT Acute Rehabilitation Services Office:[phone] 12/03/2021

## 2021-12-03 NOTE — Discharge Summary (Addendum)
[facility]  Physician Discharge Summary   Patient ID: [name]. MRN: [id number] DOB/AGE: [birth year] 51 y.o.  Admit date: 11/21/2021 Discharge date:   12/03/2021   PCP: [name], MD  DISCHARGE DIAGNOSES:    Bacteremia associated with intravascular line ([place])   Intracranial bleed ([place])   [place]an (intracerebral hemorrhage) ([place])   Hypertensive emergency   Pressure injury of skin Seizure Disorder  RECOMMENDATIONS FOR OUTPATIENT FOLLOW UP: Patient to go for dialysis as per his usual schedule Transthoracic echocardiogram to be repeated in 1 month Lung nodule to be followed up every 6 to 51-months   Home Health: PT and OT Equipment/Devices: None  CODE STATUS: Full code  DISCHARGE CONDITION: fair  Diet recommendation: As before  INITIAL HISTORY: [name]. is a 51 y.o. male with a history of stroke, atrial fibrillation, NSTEMI and CHF with chronic on Plavix and apixaban who has been having general malaise for the past few days.  Also a cough.  He has had some nausea and vomiting over the past 2 days, but only mild headache.  When he presented to the emergency department, his blood pressure was in the 200s.  Given the nausea and vomiting and severe hypertension, the ER physician at [facility] decided to perform a head CT which demonstrates a fourth ventricle bleed. Transitioned to [facility] care 11/27/21.   Consultations: Nephrology Neurology   HOSPITAL COURSE:   Interventricular hemorrhage - bilateral lateral ventricles/4th ventricle without hydrocephaly Complicated by uncontrolled hypertension Confirmed on imaging -  CTA head & neck No evidence of aneurysm, no LVO or high grade stenosis Follow up CT 10/1, 10/4 stable IVH 2D Echo EF 65 to 70%, large pericardial effusion - no evidence of tamponade LDL 34 HgbA1c 4.5 VTE prophylaxis - SCDs Previously on plavix/eliquis - on hold now given active IVH    Hypertensive emergency Home meds:  carvedilol  25 mg BID, amlodipine 5 mg daily, clonidine  0.1 mg TID PRN Currently on amlodipine 10, coreg 25, clonidine 0.3 three times daily, irbesartan Previously requiring Cleviprex - now off Keep SBP <160 - Long-term BP goal normotensive   History of seizure Continue keppra;    Fevers, resolving Unclear etiology -ID was consulted.  Antibiotics were held.  Thought to be central in origin.  Fevers have subsided.  Cultures have been negative.   MRSE considered contaminant   Hyperlipidemia Home meds: atorvastatin 80 mg daily LDL 34, goal < 70 Resume statin at DC   ESRD on HD Patient receives dialysis T, Th, Sat   Large pericardial effusion Found on both CT chest and 2D echo Likely due to ESRD - hoping volume management will improve effusion No tamponade on 2D echo Hemodynamically stable Close monitoring Will need repeat echocardiogram in the next few weeks.   Hiccup- Resolved Not able to give thorazine due to QT prolongation Not able to give baclofen due to ESRD Now on reglan Q8h PRN   Normocytic anemia Likely anemia of chronic disease.  No evidence of overt bleeding.    Incidental L lung nodule Recommend follow up chest CT every 6-12 months to monitor nodules      Pressure Injury 11/28/21 Scrotum Medial Stage 2 -  Partial thickness loss of dermis presenting as a shallow open injury with a red, pink wound bed without slough. round open red area (Active)  11/28/21 2200  Location: Scrotum  Location Orientation: Medial  Staging: Stage 2 -  Partial thickness loss of dermis presenting as a shallow open injury  with a red, pink wound bed without slough.  Wound Description (Comments): round open red area  Present on Admission:      Pressure Injury 11/28/21 Buttocks Left Stage 2 -  Partial thickness loss of dermis presenting as a shallow open injury with a red, pink wound bed without slough. blister (Active)  11/28/21 2200  Location: Buttocks  Location Orientation: Left  Staging:  Stage 2 -  Partial thickness loss of dermis presenting as a shallow open injury with a red, pink wound bed without slough.  Wound Description (Comments): blister  Present on Admission:     Patient is stable.  Initially plan was to go to CIR but this was denied by his insurance.  Skilled nursing facility for rehab was offered but patient and wife have decided to go home with home health.  Okay for discharge home today after dialysis.  ADDENDUM Just when the patient was about to be wheeled out of the hospital he apparently had a generalized tonic-clonic seizure activity.  Patient returned to baseline very quickly.  In view of this it would be prudent to monitor the patient overnight.  We will load him with a dose of Keppra.  We will increase the dose of his maintenance dose as well.  Patient seen at bedside.  Vital signs noted to be stable.  Patient denies any headaches.  No dizziness or lightheadedness.  CBGs noted to be 150.  Continue plan as outlined above.  No clear indication to repeat imaging studies at this time.  PERTINENT LABS:  The results of significant diagnostics from this hospitalization (including imaging, microbiology, ancillary and laboratory) are listed below for reference.       Labs:   Basic Metabolic Panel: Recent Labs  Lab 11/27/21 0429 12/01/21 0929 12/03/21 0749  NA 133* 134* 133*  K 3.9 3.5 3.3*  CL 93* 96* 95*  CO2 22 27 27   GLUCOSE 83 109* 131*  BUN 46* 38* 34*  CREATININE 10.28* 8.21* 7.50*  CALCIUM 9.0 8.2* 8.2*  PHOS  --  3.7 3.9   Liver Function Tests: Recent Labs  Lab 12/01/21 0929 12/03/21 0749  ALBUMIN 2.5* 2.4*    CBC: Recent Labs  Lab 11/27/21 0429 11/28/21 0334 11/29/21 0643 12/01/21 0929 12/03/21 0749  WBC 7.8 7.4 7.7 6.1 4.3  HGB 9.0* 8.8* 9.4* 9.3* 9.0*  HCT 30.2* 29.2* 30.4* 30.7* 29.4*  MCV 73.5* 73.7* 73.8* 74.2* 73.3*  PLT 159 200 216 268 244    BNP: BNP (last 3 results) Recent Labs    12/08/20 1004  07/26/21 1415 11/21/21 1117  BNP 482.0* 1,785.0* 1,416.0*     CBG: Recent Labs  Lab 11/29/21 1003 12/01/21 0309  GLUCAP 116* 119*     IMAGING STUDIES CT HEAD WO CONTRAST (5MM)  Result Date: 11/25/2021 CLINICAL DATA:  Hemorrhagic stroke follow-up EXAM: CT HEAD WITHOUT CONTRAST TECHNIQUE: Contiguous axial images were obtained from the base of the skull through the vertex without intravenous contrast. RADIATION DOSE REDUCTION: This exam was performed according to the departmental dose-optimization program which includes automated exposure control, adjustment of the mA and/or kV according to patient size and/or use of iterative reconstruction technique. COMPARISON:  Three days ago FINDINGS: Brain: Blood clot in the lower fourth ventricle extending through the foramen of Magendie. Some redistribution into the occipital horns of the lateral ventricles by CT but stable from MRI comparison 4 days ago. Stable to slightly decreased ventricular volume. No evidence of interval hemorrhage or infarct. Vascular: Extensive arterial calcification.  Skull: Normal. Negative for fracture or focal lesion. Sinuses/Orbits: No acute finding. IMPRESSION: Unchanged intraventricular clot with stable or slightly decreased ventricular volume. No new or progressive finding. Electronically Signed   By: Jorje Guild M.D.   On: 11/25/2021 05:26   CT HEAD WO CONTRAST (5MM)  Result Date: 11/22/2021 CLINICAL DATA:  Stroke, hemorrhagic EXAM: CT HEAD WITHOUT CONTRAST TECHNIQUE: Contiguous axial images were obtained from the base of the skull through the vertex without intravenous contrast. RADIATION DOSE REDUCTION: This exam was performed according to the departmental dose-optimization program which includes automated exposure control, adjustment of the mA and/or kV according to patient size and/or use of iterative reconstruction technique. COMPARISON:  11/21/2021 CT head, correlation is also made with 11/21/2021 MRI head and CT  head 07/26/2021 FINDINGS: Brain: Redemonstrated hyperdense hemorrhage in the inferior fourth ventricle, extending through the foramen of Magendie, and hypodense hemorrhage layering in the bilateral occipital horns, unchanged from the prior exam. No additional acute hemorrhage is seen. No evidence of acute infarction, mass, mass effect, or midline shift. No additional extra-axial fluid collection. Unchanged mild ventricular enlargement compared to more remote head CTs, but unchanged compared to 11/21/2021. Vascular: No hyperdense vessel. Atherosclerotic calcifications in the intracranial carotid and vertebral arteries. Skull: Normal. Negative for fracture or focal lesion. Sinuses/Orbits: Mild mucosal thickening in the maxillary sinuses, sphenoid sinuses, and ethmoid air cells. Redemonstrated left enucleation. Unchanged appearance of the right globe, status post right lens replacement. Other: The mastoid air cells are well aerated. IMPRESSION: 1. Unchanged hemorrhage in the inferior fourth ventricle, extending through the foramen of Magendie, and hypodense hemorrhage layering in the bilateral occipital horns. Unchanged size and configuration of the ventricular system compared to 11/21/2021. 2. No additional acute intracranial process. Electronically Signed   By: Merilyn Baba M.D.   On: 11/22/2021 19:58   ECHOCARDIOGRAM COMPLETE  Result Date: 11/22/2021    ECHOCARDIOGRAM REPORT   Patient Name:   Del Overfelt. Date of Exam: 11/22/2021 Medical Rec #:  [id number]            Height:       75.0 in Accession #:    [id number]           Weight:       187.0 lb Date of Birth:  1971-02-12             BSA:          2.133 m Patient Age:    27 years             BP:           124/54 mmHg Patient Gender: M                    HR:           68 bpm. Exam Location:  Inpatient Procedure: 2D Echo, Cardiac Doppler and Color Doppler Indications:    I51.7 Cardiomegaly  History:        Patient has prior history of Echocardiogram  examinations, most                 recent 04/22/2021. Cardiomegaly; Risk Factors:Hypertension,                 Diabetes and Dyslipidemia. H/O prior STEMI 02/2020 s/p DES to                 LAD, ESRD on hemodialysis [facility]), CVA (cerebral vascular  accident) [facility]).  Sonographer:    Alvino Chapel RCS Referring Phys: [phone] MCNEILL P [place]  1. Left ventricular ejection fraction, by estimation, is 65 to 70%. The left ventricle has normal function. The left ventricle has no regional wall motion abnormalities. There is severe concentric left ventricular hypertrophy. Left ventricular diastolic  parameters are consistent with Grade II diastolic dysfunction (pseudonormalization).  2. Right ventricular systolic function is normal. The right ventricular size is normal. Tricuspid regurgitation signal is inadequate for assessing PA pressure.  3. Left atrial size was severely dilated.  4. Large pericardial effusion. The pericardial effusion is circumferential. There is no evidence of cardiac tamponade.  5. The mitral valve is normal in structure. Trivial mitral valve regurgitation.  6. The aortic valve is tricuspid. Aortic valve regurgitation is not visualized. Aortic valve sclerosis is present, with no evidence of aortic valve stenosis.  7. Aortic dilatation noted. There is mild dilatation of the aortic root, measuring 40 mm.  8. The inferior vena cava is dilated in size with >50% respiratory variability, suggesting right atrial pressure of 8 mmHg. Comparison(s): When compared to prior TTE on 04/2021, the pericardial effusion has now increased in size and is more prominent around the RA. There is no evidence of tamponade. FINDINGS  Left Ventricle: Left ventricular ejection fraction, by estimation, is 65 to 70%. The left ventricle has normal function. The left ventricle has no regional wall motion abnormalities. The left ventricular internal cavity size was normal in size. There is  severe concentric  left ventricular hypertrophy. Left ventricular diastolic parameters are consistent with Grade II diastolic dysfunction (pseudonormalization). Right Ventricle: The right ventricular size is normal. No increase in right ventricular wall thickness. Right ventricular systolic function is normal. Tricuspid regurgitation signal is inadequate for assessing PA pressure. Left Atrium: Left atrial size was severely dilated. Right Atrium: Right atrial size was normal in size. Pericardium: A large pericardial effusion is present. The pericardial effusion is circumferential. There is no evidence of cardiac tamponade. Mitral Valve: The mitral valve is normal in structure. Trivial mitral valve regurgitation. Tricuspid Valve: The tricuspid valve is normal in structure. Tricuspid valve regurgitation is trivial. Aortic Valve: The aortic valve is tricuspid. Aortic valve regurgitation is not visualized. Aortic valve sclerosis is present, with no evidence of aortic valve stenosis. Aortic valve mean gradient measures 7.0 mmHg. Aortic valve peak gradient measures 10.9 mmHg. Aortic valve area, by VTI measures 4.28 cm. Pulmonic Valve: The pulmonic valve was normal in structure. Pulmonic valve regurgitation is trivial. Aorta: Aortic dilatation noted. There is mild dilatation of the aortic root, measuring 40 mm. Venous: The inferior vena cava is dilated in size with greater than 50% respiratory variability, suggesting right atrial pressure of 8 mmHg. IAS/Shunts: The atrial septum is grossly normal.  LEFT VENTRICLE PLAX 2D LVIDd:         5.30 cm   Diastology LVIDs:         2.80 cm   LV e' medial:    5.22 cm/s LV PW:         1.80 cm   LV E/e' medial:  15.8 LV IVS:        1.90 cm   LV e' lateral:   7.18 cm/s LVOT diam:     2.60 cm   LV E/e' lateral: 11.5 LV SV:         159 LV SV Index:   75 LVOT Area:     5.31 cm  RIGHT VENTRICLE RV S prime:  14.00 cm/s TAPSE (M-mode): 2.2 cm LEFT ATRIUM              Index        RIGHT ATRIUM           Index  LA diam:        3.90 cm  1.83 cm/m   RA Area:     21.40 cm LA Vol (A2C):   132.0 ml 61.90 ml/m  RA Volume:   68.40 ml  32.07 ml/m LA Vol (A4C):   152.0 ml 71.27 ml/m LA Biplane Vol: 143.0 ml 67.05 ml/m  AORTIC VALVE AV Area (Vmax):    4.70 cm AV Area (Vmean):   4.23 cm AV Area (VTI):     4.28 cm AV Vmax:           165.00 cm/s AV Vmean:          122.000 cm/s AV VTI:            0.372 m AV Peak Grad:      10.9 mmHg AV Mean Grad:      7.0 mmHg LVOT Vmax:         146.00 cm/s LVOT Vmean:        97.300 cm/s LVOT VTI:          0.300 m LVOT/AV VTI ratio: 0.81  AORTA Ao Root diam: 4.00 cm MITRAL VALVE MV Area (PHT): 2.91 cm    SHUNTS MV Decel Time: 261 msec    Systemic VTI:  0.30 m MV E velocity: 82.30 cm/s  Systemic Diam: 2.60 cm MV A velocity: 80.80 cm/s MV E/A ratio:  1.02 Gwyndolyn Kaufman MD Electronically signed by Gwyndolyn Kaufman MD Signature Date/Time: 11/22/2021/3:16:41 PM    Final    MR BRAIN WO CONTRAST  Result Date: 11/21/2021 CLINICAL DATA:  Generalized weakness, stroke suspected EXAM: MRI HEAD WITHOUT CONTRAST TECHNIQUE: Multiplanar, multiecho pulse sequences of the brain and surrounding structures were obtained without intravenous contrast. COMPARISON:  06/23/2021 MRI, correlation is also made with CT head 11/21/2021 FINDINGS: Brain: No restricted diffusion to suggest acute or subacute infarct. T1 isointense and T2 hypointense material in the fourth ventricle and foramen of Magendie, consistent with acute hemorrhage (1-3 days), which correlates with the hyperdense hemorrhage seen on the same-day head CT. Hemorrhage is also noted layering in the bilateral occipital horns (series 11, image 14). Additional areas of hemosiderin deposition without evidence of current hemorrhage in the bilateral temporal lobe sulci and sylvian fissures (series 14, image 31), along the anterior falx (series 14, image 39), along the bilateral operculum (series 14, image 34), and along the cerebellar folia (series 14,  image 21). Additional punctate focus of hemosiderin deposition in the right periventricular frontal lobe white matter (series 14, image 20). All of this hemosiderin deposition is new compared to 06/23/2021. No acute parenchymal hemorrhage. No mass, mass effect, or midline shift. Mild enlargement of the lateral ventricles compared to 06/23/2021, concerning for mild hydrocephalus. Vascular: Normal arterial flow voids. Skull and upper cervical spine: Normal marrow signal. Sinuses/Orbits: Mucosal thickening in the maxillary sinuses and ethmoid air cells. Unchanged appearance of the orbits, with removal of the left globe. Status post right lens replacement. Other: The mastoids are well aerated. IMPRESSION: 1. Acute hemorrhage in the fourth ventricle and foramen of Magendie, which correlates with the hyperdense hemorrhage seen on the same-day head CT. 2. Hemorrhage layering within the bilateral occipital horns, with mild enlargement of the lateral ventricles compared to 06/23/2021, concerning for mild hydrocephalus.  Ventricular size is unchanged compared to the 11/21/2021 CT head. 3. Additional areas of hemosiderin deposition without evidence of current hemorrhage in the bilateral temporal lobe sulci and Sylvian fissures, along the anterior falx, along the bilateral operculum, and along the cerebellar folia, all of which are new compared to 06/23/2021. These findings were discussed by telephone on 11/21/2021 at 10:09 pm with provider MCNEILL KIRKPATRICK . Electronically Signed   By: Merilyn Baba M.D.   On: 11/21/2021 22:11   CT Chest Wo Contrast  Result Date: 11/21/2021 CLINICAL DATA:  Respiratory illness, nondiagnostic xray EXAM: CT CHEST WITHOUT CONTRAST TECHNIQUE: Multidetector CT imaging of the chest was performed following the standard protocol without IV contrast. RADIATION DOSE REDUCTION: This exam was performed according to the departmental dose-optimization program which includes automated exposure control,  adjustment of the mA and/or kV according to patient size and/or use of iterative reconstruction technique. COMPARISON:  Same day radiograph FINDINGS: Cardiovascular: Cardiomegaly. Moderate to large pericardial effusion. Three-vessel coronary artery atherosclerotic calcifications. RIGHT IJ CVC tip terminates in the RIGHT atrium. Scattered atherosclerotic calcifications of the nonaneurysmal thoracic aorta. Mediastinum/Nodes: Visualized thyroid is unremarkable. No axillary or mediastinal adenopathy. Lungs/Pleura: Trace RIGHT-sided pleural effusion. RIGHT basilar homogeneous opacity, likely atelectasis. No pneumothorax. LEFT apical ground-glass nodularity. RIGHT upper lobe 7 mm by 5 mm ground-glass pulmonary nodule versus ground-glass opacity (series 4, image 53). Upper Abdomen: Distension of the gallbladder. Musculoskeletal: No acute osseous abnormality. IMPRESSION: 1. Cardiomegaly with a moderate to large pericardial effusion. 2. LEFT apical ground-glass nodularity, likely infectious or inflammatory in etiology. There is a more focal RIGHT upper lobe ground-glass nodule measuring up to 6 mm. 6 mm right ground-glass pulmonary nodule within the upper lobe. Per Fleischner Society Guidelines, recommend a non-contrast Chest CT at 6-12 months to confirm persistence, then additional non-contrast Chest CTs every 2 years until 5 years. If nodule grows or develops solid component(s), consider resection. These guidelines do not apply to immunocompromised patients and patients with cancer. Follow up in patients with significant comorbidities as clinically warranted. For lung cancer screening, adhere to Lung-RADS guidelines. Reference: Radiology. 2017; 284(1):228-43. 3. Trace RIGHT pleural effusion. 4. Distension of the gallbladder. Recommend correlation with physical exam. If concern for acute cholecystitis, recommend dedicated HIDA scan. Aortic Atherosclerosis (ICD10-I70.0). Electronically Signed   By: Valentino Saxon M.D.    On: 11/21/2021 19:49   CT ANGIO HEAD NECK W WO CM (CODE STROKE)  Result Date: 11/21/2021 CLINICAL DATA:  Provided history: Neuro deficit, acute, stroke suspected. EXAM: CT ANGIOGRAPHY HEAD AND NECK TECHNIQUE: Multidetector CT imaging of the head and neck was performed using the standard protocol during bolus administration of intravenous contrast. Multiplanar CT image reconstructions and MIPs were obtained to evaluate the vascular anatomy. Carotid stenosis measurements (when applicable) are obtained utilizing NASCET criteria, using the distal internal carotid diameter as the denominator. RADIATION DOSE REDUCTION: This exam was performed according to the departmental dose-optimization program which includes automated exposure control, adjustment of the mA and/or kV according to patient size and/or use of iterative reconstruction technique. CONTRAST:  12mL OMNIPAQUE IOHEXOL 350 MG/ML SOLN COMPARISON:  Performed earlier today 11/21/2021. CT angiogram head/neck 04/21/2021. FINDINGS: CTA NECK FINDINGS Aortic arch: Standard aortic branching. Atherosclerotic plaque within the visualized aortic arch and proximal major branch vessels of the neck. No hemodynamically significant innominate or proximal subclavian artery stenosis. Right carotid system: CCA and ICA patent within the neck without stenosis. Minimal atherosclerotic about the carotid bifurcation. Left carotid system: CCA and ICA patent within the neck without  stenosis. Minimal atherosclerotic plaque of the carotid bifurcation Vertebral arteries: Vertebral arteries patent within the neck. The right vertebral artery is dominant. Nonstenotic calcified plaque within the proximal right vertebral artery. Skeleton: Cervical spondylosis, greatest at C3-C4. No acute fracture or aggressive osseous lesion. Other neck: Subcentimeter thyroid nodules, not meeting consensus criteria for ultrasound follow-up based on size. No follow-up imaging recommended. Reference: J Am Coll  Radiol. 2015 Feb;12(2): 143-50. Generalized soft tissue edema within the neck. Upper chest: Clustered small nodules within the left lung apex. Review of the MIP images confirms the above findings CTA HEAD FINDINGS Anterior circulation: An ICA nonstenotic atherosclerotic plaque within both vessels. The M1 middle cerebral arteries are patent. No M2 proximal branch occlusion or high-grade proximal stenosis. The anterior cerebral arteries are patent. No intracranial aneurysm is identified. Posterior circulation: The intracranial vertebral arteries are patent. Atherosclerotic plaque within the intracranial right vertebral artery with no more than mild stenosis. The basilar artery is patent. The posterior cerebral arteries are patent. No evidence of an aneurysm or AVM within the posterior fossa. Venous sinuses: Within the limitations of contrast timing, no convincing thrombus. Anatomic variants: As described Review of the MIP images confirms the above findings IMPRESSION: CTA neck: 1. The common carotid and internal carotid arteries are patent within the neck without stenosis. Minimal atherosclerotic plaque about both carotid bifurcations. 2. Cerebral arteries patent within the neck. Nonstenotic plaque within the proximal right vertebral artery. 3.  Aortic Atherosclerosis (ICD10-I70.0). 4. Clustered small nodules within the left lung apex. Findings may reflect infectious/inflammatory process. However, pulmonary neoplasm cannot be excluded. A dedicated chest CT is recommended for complete evaluation. CTA head: 1. No evidence of an aneurysm or AVM within the posterior fossa. 2. No intracranial large vessel occlusion or proximal high-grade arterial stenosis identified. Mild atherosclerotic disease, as described. Electronically Signed   By: Kellie Simmering D.O.   On: 11/21/2021 16:17   CT Head Wo Contrast  Result Date: 11/21/2021 CLINICAL DATA:  Transient ischemic attack. Additional history provided: Generalized weakness for  the past week. EXAM: CT HEAD WITHOUT CONTRAST TECHNIQUE: Contiguous axial images were obtained from the base of the skull through the vertex without intravenous contrast. RADIATION DOSE REDUCTION: This exam was performed according to the departmental dose-optimization program which includes automated exposure control, adjustment of the mA and/or kV according to patient size and/or use of iterative reconstruction technique. COMPARISON:  Head CT 07/26/2021.  Brain MRI 06/23/2021. FINDINGS: Brain: No age advanced or lobar predominant parenchymal atrophy. Acute hemorrhage within the inferior aspect of the fourth ventricle, extending inferiorly through the foramen Magendie, and measuring 1.4 x 1.5 x 3.1 cm (for instance as seen on series 6, image 34). No evidence of obstructive hydrocephalus at this time. 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 Mild multifocal T2 FLAIR hyperintense signal abnormality within the cerebral white matter, nonspecific but compatible with chronic small vessel disease. No acute demarcated cortical infarct. No evidence of an intracranial mass. No midline shift. Vascular: No hyperdense vessel. Atherosclerotic calcifications. Skull: No fracture or aggressive osseous lesion. Sinuses/Orbits: Absent left globe. No significant paranasal sinus disease at the imaged levels. These results were called by telephone at the time of interpretation on 11/21/2021 at 2:18 pm to provider Dr. Roderic Palau, who verbally acknowledged these results. IMPRESSION: 1.4 x 1.5 x 3.1 cm focus of acute hemorrhage within the inferior aspect of the fourth ventricle, and extending inferiorly through the foramen of foramen of [place]. No evidence of obstructive  hydrocephalus at this time. This hemorrhage is indeterminate in etiology, and a CTA of the head may be obtained to help exclude an intracranial aneurysm or AVM. Background chronic small vessel ischemic disease  and chronic infarcts, as described. Electronically Signed   By: Kellie Simmering D.O.   On: 11/21/2021 14:20   DG Chest Port 1 View  Result Date: 11/21/2021 CLINICAL DATA:  Shortness of breath and weakness for 1 week. EXAM: PORTABLE CHEST 1 VIEW COMPARISON:  Chest radiograph 10/24/2021 and earlier FINDINGS: Of note, the patient is mildly rotated on this portable examination. Stable enlarged cardiac silhouette. Right IJ central venous catheter tip projects at the level of the superior cavoatrial junction. No focal consolidation, pleural effusion, or pneumothorax. IMPRESSION: 1. Stable cardiomegaly. 2. Lungs are clear. Electronically Signed   By: Ileana Roup M.D.   On: 11/21/2021 11:32    DISCHARGE EXAMINATION: Vitals:   12/03/21 1030 12/03/21 1112 12/03/21 1117 12/03/21 1638  BP: (!) 161/88 (!) 156/81 (!) 179/88 138/69  Pulse: (!) 55 (!) 56 (!) 57 (!) 57  Resp: 11 13 11 19   Temp:   98 F (36.7 C) 97.9 F (36.6 C)  TempSrc:   Oral Oral  SpO2: 99% 100% 100% 100%  Weight:   77.6 kg   Height:       General appearance: Awake alert.  In no distress Resp: Clear to auscultation bilaterally.  Normal effort Cardio: S1-S2 is normal regular.  No S3-S4.  No rubs murmurs or bruit GI: Abdomen is soft.  Nontender nondistended.  Bowel sounds are present normal.  No masses organomegaly    DISPOSITION: Home  Discharge Instructions     Ambulatory referral to Neurology   Complete by: As directed    Follow up with stroke clinic NP (Jessica Vanschaick or Cecille Rubin, if both not available, consider Zachery Dauer, or [place]) at [facility] in about 4 weeks. Thanks.   Call MD for:  difficulty breathing, headache or visual disturbances   Complete by: As directed    Call MD for:  extreme fatigue   Complete by: As directed    Call MD for:  persistant dizziness or light-headedness   Complete by: As directed    Call MD for:  persistant nausea and vomiting   Complete by: As directed    Call MD for:  temperature  >100.4   Complete by: As directed    Diet - low sodium heart healthy   Complete by: As directed    Discharge instructions   Complete by: As directed    Home health has been ordered.  Please be sure to keep your follow-up appointments.  Please take your blood pressure medications consistently.  You were cared for by a hospitalist during your hospital stay. If you have any questions about your discharge medications or the care you received while you were in the hospital after you are discharged, you can call the unit and asked to speak with the hospitalist on call if the hospitalist that took care of you is not available. Once you are discharged, your primary care physician will handle any further medical issues. Please note that NO REFILLS for any discharge medications will be authorized once you are discharged, as it is imperative that you return to your primary care physician (or establish a relationship with a primary care physician if you do not have one) for your aftercare needs so that they can reassess your need for medications and monitor your lab values. If you do not  have a primary care physician, you can call [phone] for a physician referral.   Increase activity slowly   Complete by: As directed    No wound care   Complete by: As directed           Allergies as of 12/03/2021       Reactions   Promethazine Diarrhea, Other (See Comments)   Muscle cramps   Promethazine Hcl Other (See Comments)   Cramping all over Other reaction(s): cramp all over   Other    Other reaction(s): Unknown Other reaction(s): Unknown   Malt Other (See Comments)   Cramping        Medication List     STOP taking these medications    clonazePAM 0.5 MG disintegrating tablet Commonly known as: KLONOPIN   clopidogrel 75 MG tablet Commonly known as: PLAVIX   Eliquis 2.5 MG Tabs tablet Generic drug: apixaban   prednisoLONE acetate 1 % ophthalmic suspension Commonly known as: PRED FORTE        TAKE these medications    acarbose 50 MG tablet Commonly known as: PRECOSE Take 50 mg by mouth 3 (three) times daily.   acetaminophen 325 MG tablet Commonly known as: TYLENOL Take 1-2 tablets (325-650 mg total) by mouth every 4 (four) hours as needed for mild pain.   amiodarone 200 MG tablet Commonly known as: PACERONE Take 1 tablet (200 mg total) by mouth every morning.   amLODipine 10 MG tablet Commonly known as: NORVASC Take 1 tablet (10 mg total) by mouth daily. What changed:  medication strength how much to take   atorvastatin 80 MG tablet Commonly known as: LIPITOR Take 1 tablet (80 mg total) by mouth daily. What changed: when to take this   calcium acetate 667 MG capsule Commonly known as: PHOSLO Take 2 capsules (1,334 mg total) by mouth 3 (three) times daily with meals.   carvedilol 25 MG tablet Commonly known as: COREG Take 25 mg by mouth 2 (two) times daily.   cholestyramine 4 GM/DOSE powder Commonly known as: QUESTRAN Take 4 g by mouth 2 (two) times daily.   cloNIDine 0.3 MG tablet Commonly known as: CATAPRES Take 1 tablet (0.3 mg total) by mouth every 8 (eight) hours. What changed:  medication strength how much to take when to take this reasons to take this   dorzolamide-timolol 2-0.5 % ophthalmic solution Commonly known as: COSOPT Place 1 drop into the right eye 2 (two) times daily.   irbesartan 300 MG tablet Commonly known as: AVAPRO Take 1 tablet (300 mg total) by mouth daily.   levETIRAcetam 500 MG tablet Commonly known as: KEPPRA Take 1 tablet (500 mg total) by mouth 2 (two) times daily.   levETIRAcetam 250 MG tablet Commonly known as: KEPPRA Take 1 tablet (250 mg total) by mouth Every Tuesday,Thursday,and Saturday with dialysis.   nitroGLYCERIN 0.4 MG SL tablet Commonly known as: NITROSTAT Place 1 tablet (0.4 mg total) under the tongue every 5 (five) minutes as needed for chest pain.   tamsulosin 0.4 MG Caps  capsule Commonly known as: FLOMAX Take 0.4 mg by mouth daily.          Follow-up Information     [facility]. Schedule an appointment as soon as possible for a visit in 1 month(s).   Specialty: Neurology Why: stroke clinic Contact information: [address] [place], [facility] Follow up.   Specialty: Home  Health Services Why: The home health agency will contact you for the first home visit. Contact information: [place] [address] [phone]                 TOTAL DISCHARGE TIME: 35 minutes  [facility] Pager on [email or web address]  12/03/2021, 5:07 PM

## 2021-12-03 NOTE — Progress Notes (Signed)
Received patient in bed to unit.  Alert and oriented.  Informed consent signed and in chart.   Treatment initiated: 0734 Treatment completed: 1112  Patient tolerated well.  Transported back to the room  Alert, without acute distress.  Hand-off given to patient's nurse.   Access used: HD cath Access issues: NA  Total UF removed: 500 ml Medication(s) given: heparin dwells 4200 units Post HD VS  98    179/88    57    11    100% RA Post HD weight: 77.6 kg   [facility]

## 2021-12-03 NOTE — Progress Notes (Signed)
Physical Therapy Treatment Patient Details Name: [name]. MRN: [id number] DOB: [birth year] Today's Date: 12/03/2021   History of Present Illness 51 yo male admitted 9/30 ICH 4th ventricle and lateral ventricles.  PMH includes: ESRD on HD TTS, HTN, amputation L big toe, L eye absent, CVA, afib on plavix and Eliquis, Glaucoma, seizure, PVD, and CHF.    PT Comments    Patient seen after HD noting plans for d/c home so assisted for getting dressed and noted pt improved with transfers with +1 A though poor activity tolerance needing to lay down and noted BP 100/72 with RN aware.  Pt declined for AIR so recommend HHPT as pt to d/c home.   States he has wheelchair and ramp for home entry.  Recommendations for follow up therapy are one component of a multi-disciplinary discharge planning process, led by the attending physician.  Recommendations may be updated based on patient status, additional functional criteria and insurance authorization.  Follow Up Recommendations  Home health PT     Assistance Recommended at Discharge Frequent or constant Supervision/Assistance  Patient can return home with the following A little help with walking and/or transfers;A lot of help with bathing/dressing/bathroom;Assistance with cooking/housework;Assist for transportation;Help with stairs or ramp for entrance;Direct supervision/assist for medications management   Equipment Recommendations  None recommended by PT    Recommendations for Other Services       Precautions / Restrictions Precautions Precautions: Fall Precaution Comments: SBP goal <160, seizure, HD port R chest, HD fistulas BUE, orthostatic hypotension     Mobility  Bed Mobility Overal bed mobility: Needs Assistance Bed Mobility: Supine to Sit, Sit to Supine     Supine to sit: HOB elevated, Min assist Sit to supine: Min assist   General bed mobility comments: assist to reposition head and legs to supine as pt leaning back from  seated EOB; assist for lifting trunk to sit    Transfers Overall transfer level: Needs assistance Equipment used: Rolling walker (2 wheels) Transfers: Sit to/from Stand Sit to Stand: Min assist, From elevated surface           General transfer comment: stood to allow PT to help pull up briefs and pants from EOB and A for balance    Ambulation/Gait Ambulation/Gait assistance: Min assist Gait Distance (Feet): 2 Feet Assistive device: Rolling walker (2 wheels) Gait Pattern/deviations: Step-to pattern       General Gait Details: side steps to [facility] prior to sitting with cues and assist for balance   Stairs             Wheelchair Mobility    Modified Rankin (Stroke Patients Only) Modified Rankin (Stroke Patients Only) Pre-Morbid Rankin Score: Moderate disability Modified Rankin: Moderately severe disability     Balance Overall balance assessment: Needs assistance Sitting-balance support: Feet supported Sitting balance-Leahy Scale: Poor Sitting balance - Comments: trouble staying on EOB after initially sitting with S likely due to fatigue and BP drop     Standing balance-Leahy Scale: Poor Standing balance comment: UE support for balance while PT pulled up briefs and pants                            Cognition Arousal/Alertness: Awake/alert Behavior During Therapy: Flat affect Overall Cognitive Status: Impaired/Different from baseline Area of Impairment: Attention, Following commands, Safety/judgement, Awareness, Problem solving, Memory                   Current Attention  Level: Sustained Memory: Decreased short-term memory Following Commands: Follows one step commands with increased time, Follows multi-step commands inconsistently Safety/Judgement: Decreased awareness of deficits, Decreased awareness of safety   Problem Solving: Slow processing, Difficulty sequencing, Requires verbal cues, Requires tactile cues General Comments: states he  got up about 30 minutes ago, but RN and NT stated they did not help him        Exercises      General Comments General comments (skin integrity, edema, etc.): Donned shirt on EOB with mod A; he was waiting on wife and ready to dress.  BP 100/72 seated EOB; pt feeling weak, RN aware      Pertinent Vitals/Pain Pain Assessment Pain Assessment: Faces Faces Pain Scale: Hurts even more Pain Location: back/bottom with mobility Pain Descriptors / Indicators: Discomfort, Grimacing, Moaning Pain Intervention(s): Monitored during session, Repositioned    Home Living                          Prior Function            PT Goals (current goals can now be found in the care plan section) Progress towards PT goals: Progressing toward goals    Frequency    Min 4X/week      PT Plan Discharge plan needs to be updated    Co-evaluation              AM-PAC PT "6 Clicks" Mobility   Outcome Measure  Help needed turning from your back to your side while in a flat bed without using bedrails?: A Little Help needed moving from lying on your back to sitting on the side of a flat bed without using bedrails?: A Little Help needed moving to and from a bed to a chair (including a wheelchair)?: A Little Help needed standing up from a chair using your arms (e.g., wheelchair or bedside chair)?: A Little Help needed to walk in hospital room?: Total Help needed climbing 3-5 steps with a railing? : Total 6 Click Score: 14    End of Session Equipment Utilized During Treatment: Gait belt Activity Tolerance: Patient limited by fatigue Patient left: in bed;with call bell/phone within reach Nurse Communication: Other (comment) (dressed for d/c, low BP) PT Visit Diagnosis: Other abnormalities of gait and mobility (R26.89);Other symptoms and signs involving the nervous system (R29.898);Muscle weakness (generalized) (M62.81)     Time: 6440-3474 PT Time Calculation (min) (ACUTE ONLY): 17  min  Charges:  $Therapeutic Activity: 8-22 mins                     [name], PT Acute Rehabilitation Services Office:[phone] 12/03/2021    [name] 12/03/2021, 5:07 PM

## 2021-12-03 NOTE — Progress Notes (Signed)
OT Cancellation Note  Patient Details Name: [name]. MRN: [id number] DOB: 1970-05-28   Cancelled Treatment:    Reason Eval/Treat Not Completed: Patient at procedure or test/ unavailable. Pt at HD. Will return as schedule allows.   Shanda Howells, OTR/L [facility] Acute Rehabilitation Office: [phone]   Lula Olszewski 12/03/2021, 7:52 AM

## 2021-12-03 NOTE — Progress Notes (Addendum)
D/C order noted. Contacted [facility] and spoke to [place]. Clinic advised pt will d/c today and resume care on Saturday. Will fax d/c summary to clinic once completed for continuation of care.   Melven Sartorius Renal Navigator [phone]  Addendum at 4:19 pm: H & P and renal consult/notes faxed to clinic for continuation of care. D/C summary has not been completed as of yet.   Addendum at 6:24 pm: D/C summary faxed [phone]) to clinic for continuation of care.

## 2021-12-03 NOTE — TOC Transition Note (Signed)
Transition of Care [facility]) - CM/SW Discharge Note   Patient Details  Name: [name]. MRN: [id number] Date of Birth: 10/02/1970  [facility] of [facility]) CM/SW Contact:  Pollie Friar, RN Phone Number: 12/03/2021, 12:23 PM   Clinical Narrative:    Pt discharging home with spouse. Pt with orders for [facility] services. Pt has used [facility] in the past and he would like to use them again. Cory with [facility] accepted and information on the AVS.  [name] spouse to provide transport home.    Final next level of care: Home w Home Health Services Barriers to Discharge: No Barriers Identified   Patient Goals and CMS Choice   CMS Medicare.gov Compare Post Acute Care list provided to:: Patient Choice offered to / list presented to : Patient, Spouse  Discharge Placement                       Discharge Plan and Services In-house Referral: Clinical Social Work Discharge Planning Services: CM Consult                      HH Arranged: PT, OT [place] Agency: [place] Date [place]: 12/03/21   Representative spoke with at [place]: [place] Determinants of Health ([place]) Interventions Food Insecurity Interventions: Intervention Not Indicated Housing Interventions: Intervention Not Indicated Transportation Interventions: Intervention Not Indicated Utilities Interventions: Intervention Not Indicated   Readmission Risk Interventions    07/27/2021   11:49 AM 11/27/2020    3:06 PM 02/28/2020    1:31 PM  Readmission Risk Prevention Plan  Transportation Screening Complete Complete Complete  PCP or Specialist Appt within 3-5 Days  Complete Complete  HRI or [place]  Complete Complete  Social Work Consult for [place] Planning/Counseling  Complete Complete  Palliative Care Screening  Not Applicable Not Applicable  Medication Review Press photographer) Complete Complete Complete  SW Recovery Care/Counseling Consult Complete    [place] Not Applicable

## 2021-12-03 NOTE — Procedures (Signed)
j

## 2021-12-03 NOTE — Plan of Care (Signed)
  Problem: Education: Goal: Knowledge of disease or condition will improve Outcome: Progressing Goal: Knowledge of secondary prevention will improve (SELECT ALL) Outcome: Progressing Goal: Knowledge of patient specific risk factors will improve (INDIVIDUALIZE FOR PATIENT) Outcome: Progressing Goal: Individualized Educational Video(s) Outcome: Progressing   Problem: Education: Goal: Knowledge of General Education information will improve Description: Including pain rating scale, medication(s)/side effects and non-pharmacologic comfort measures Outcome: Progressing   Problem: Health Behavior/Discharge Planning: Goal: Ability to manage health-related needs will improve Outcome: Progressing   Problem: Clinical Measurements: Goal: Ability to maintain clinical measurements within normal limits will improve Outcome: Progressing Goal: Will remain free from infection Outcome: Progressing Goal: Diagnostic test results will improve Outcome: Progressing Goal: Respiratory complications will improve Outcome: Progressing Goal: Cardiovascular complication will be avoided Outcome: Progressing   Problem: Activity: Goal: Risk for activity intolerance will decrease Outcome: Progressing   Problem: Nutrition: Goal: Adequate nutrition will be maintained Outcome: Progressing   Problem: Coping: Goal: Level of anxiety will decrease Outcome: Progressing   Problem: Elimination: Goal: Will not experience complications related to bowel motility Outcome: Progressing   Problem: Pain Managment: Goal: General experience of comfort will improve Outcome: Progressing   Problem: Safety: Goal: Ability to remain free from injury will improve Outcome: Progressing   Problem: Skin Integrity: Goal: Risk for impaired skin integrity will decrease Outcome: Progressing   Problem: Education: Goal: Knowledge of disease or condition will improve Outcome: Progressing Goal: Knowledge of secondary prevention  will improve (SELECT ALL) Outcome: Progressing Goal: Knowledge of patient specific risk factors will improve (INDIVIDUALIZE FOR PATIENT) Outcome: Progressing Goal: Individualized Educational Video(s) Outcome: Progressing   Problem: Education: Goal: Knowledge of General Education information will improve Description: Including pain rating scale, medication(s)/side effects and non-pharmacologic comfort measures Outcome: Progressing   Problem: Health Behavior/Discharge Planning: Goal: Ability to manage health-related needs will improve Outcome: Progressing   Problem: Clinical Measurements: Goal: Ability to maintain clinical measurements within normal limits will improve Outcome: Progressing Goal: Will remain free from infection Outcome: Progressing Goal: Diagnostic test results will improve Outcome: Progressing Goal: Respiratory complications will improve Outcome: Progressing Goal: Cardiovascular complication will be avoided Outcome: Progressing   Problem: Activity: Goal: Risk for activity intolerance will decrease Outcome: Progressing   Problem: Nutrition: Goal: Adequate nutrition will be maintained Outcome: Progressing   Problem: Coping: Goal: Level of anxiety will decrease Outcome: Progressing   Problem: Elimination: Goal: Will not experience complications related to bowel motility Outcome: Progressing   Problem: Pain Managment: Goal: General experience of comfort will improve Outcome: Progressing   Problem: Safety: Goal: Ability to remain free from injury will improve Outcome: Progressing   Problem: Skin Integrity: Goal: Risk for impaired skin integrity will decrease Outcome: Progressing

## 2021-12-04 ENCOUNTER — Inpatient Hospital Stay (HOSPITAL_COMMUNITY): Payer: Medicare HMO

## 2021-12-04 DIAGNOSIS — I629 Nontraumatic intracranial hemorrhage, unspecified: Secondary | ICD-10-CM | POA: Diagnosis not present

## 2021-12-04 DIAGNOSIS — I615 Nontraumatic intracerebral hemorrhage, intraventricular: Secondary | ICD-10-CM | POA: Diagnosis not present

## 2021-12-04 DIAGNOSIS — E43 Unspecified severe protein-calorie malnutrition: Secondary | ICD-10-CM | POA: Insufficient documentation

## 2021-12-04 DIAGNOSIS — R7881 Bacteremia: Secondary | ICD-10-CM | POA: Diagnosis not present

## 2021-12-04 DIAGNOSIS — R569 Unspecified convulsions: Secondary | ICD-10-CM

## 2021-12-04 DIAGNOSIS — T827XXA Infection and inflammatory reaction due to other cardiac and vascular devices, implants and grafts, initial encounter: Secondary | ICD-10-CM | POA: Diagnosis not present

## 2021-12-04 DIAGNOSIS — I161 Hypertensive emergency: Secondary | ICD-10-CM | POA: Diagnosis not present

## 2021-12-04 LAB — COMPREHENSIVE METABOLIC PANEL
ALT: 6 U/L (ref 0–44)
AST: 10 U/L — ABNORMAL LOW (ref 15–41)
Albumin: 2.5 g/dL — ABNORMAL LOW (ref 3.5–5.0)
Alkaline Phosphatase: 78 U/L (ref 38–126)
Anion gap: 11 (ref 5–15)
BUN: 27 mg/dL — ABNORMAL HIGH (ref 6–20)
CO2: 26 mmol/L (ref 22–32)
Calcium: 8.5 mg/dL — ABNORMAL LOW (ref 8.9–10.3)
Chloride: 97 mmol/L — ABNORMAL LOW (ref 98–111)
Creatinine, Ser: 6.77 mg/dL — ABNORMAL HIGH (ref 0.61–1.24)
GFR, Estimated: 9 mL/min — ABNORMAL LOW (ref >60)
Glucose, Bld: 145 mg/dL — ABNORMAL HIGH (ref 70–99)
Potassium: 3.7 mmol/L (ref 3.5–5.1)
Sodium: 134 mmol/L — ABNORMAL LOW (ref 135–145)
Total Bilirubin: 0.3 mg/dL (ref 0.3–1.2)
Total Protein: 5.8 g/dL — ABNORMAL LOW (ref 6.5–8.1)

## 2021-12-04 LAB — CBC
HCT: 31.2 % — ABNORMAL LOW (ref 39.0–52.0)
Hemoglobin: 9.5 g/dL — ABNORMAL LOW (ref 13.0–17.0)
MCH: 22.5 pg — ABNORMAL LOW (ref 26.0–34.0)
MCHC: 30.4 g/dL (ref 30.0–36.0)
MCV: 73.9 fL — ABNORMAL LOW (ref 80.0–100.0)
Platelets: 218 10*3/uL (ref 150–400)
RBC: 4.22 MIL/uL (ref 4.22–5.81)
RDW: 20.5 % — ABNORMAL HIGH (ref 11.5–15.5)
WBC: 6.5 10*3/uL (ref 4.0–10.5)
nRBC: 0 % (ref 0.0–0.2)

## 2021-12-04 MED ORDER — RENA-VITE PO TABS
1.0000 | ORAL_TABLET | Freq: Every day | ORAL | Status: DC
  Administered 2021-12-04: 1 via ORAL
  Filled 2021-12-04: qty 1

## 2021-12-04 MED ORDER — NEPRO/CARBSTEADY PO LIQD
237.0000 mL | Freq: Two times a day (BID) | ORAL | Status: DC
  Administered 2021-12-04: 237 mL via ORAL

## 2021-12-04 MED ORDER — POLYETHYLENE GLYCOL 3350 17 G PO PACK
17.0000 g | PACK | Freq: Every day | ORAL | Status: DC
  Administered 2021-12-04: 17 g via ORAL
  Filled 2021-12-04: qty 1

## 2021-12-04 MED ORDER — PROSOURCE PLUS PO LIQD
30.0000 mL | Freq: Two times a day (BID) | ORAL | Status: DC
  Administered 2021-12-04: 30 mL via ORAL
  Filled 2021-12-04 (×2): qty 30

## 2021-12-04 NOTE — Progress Notes (Signed)
PROGRESS NOTE   [name].  JOA:[id number] DOB: 10-24-1970 DOA: 11/21/2021 PCP: [name], MD  Brief Narrative:  [name]. is a 51 y.o. male with a history of stroke, atrial fibrillation, NSTEMI and CHF with chronic on Plavix and apixaban who has been having general malaise for the past few days.  Also a cough.  He has had some nausea and vomiting over the past 2 days, but only mild headache.  When he presented to the emergency department, his blood pressure was in the 200s.  Given the nausea and vomiting and severe hypertension, the ER physician at [facility] decided to perform a head CT which demonstrates a fourth ventricle bleed. Transitioned to [facility] care 11/27/21.  Assessment & Plan:  Seizure in the setting of known seizure disorder While he was being prepared for discharge patient had a seizure activity on 10/12.  Patient was seen at bedside.  His mentation was back to baseline.  CBG was 150.  Vital signs are stable.  Denied any headache.  He was given a loading dose of Keppra and his maintenance dose was increased.  Monitored overnight Felt well this morning but when he was mobilized he felt dizzy lightheaded did not feel well.  We will proceed with CT head and check labs again today.  Discussed with Dr. Leonie Man with neurology who will evaluate patient today.  Interventricular hemorrhage - bilateral lateral ventricles/4th ventricle without hydrocephaly Complicated by uncontrolled hypertension Confirmed on imaging -  CTA head & neck No evidence of aneurysm, no LVO or high grade stenosis Follow up CT 10/1, 10/4 stable IVH 2D Echo EF 65 to 70%, large pericardial effusion - no evidence of tamponade LDL 34 HgbA1c 4.5 VTE prophylaxis - SCDs Previously on plavix/eliquis - on hold now given active IVH   Hypertensive emergency Home meds:  carvedilol 25 mg BID, amlodipine 5 mg daily, clonidine  0.1 mg TID PRN Currently on amlodipine 10, coreg 25, clonidine 0.3 three times  daily, irbesartan Previously requiring Cleviprex - now off Keep SBP <160 - Long-term BP goal normotensive Blood pressure very well controlled on current regimen.  Fevers, resolving Unclear etiology -ID was consulted.  Antibiotics were held.  Thought to be central in origin.  Fevers appear to be subsiding.  Cultures of been negative. MRSE considered contaminant  Hyperlipidemia Home meds: atorvastatin 80 mg daily LDL 34, goal < 70 Resume statin at DC   ESRD on HD Patient receives dialysis T, Th, Sat Nephrology continues to follow   Large pericardial effusion Found on both CT chest and 2D echo Likely due to ESRD - hoping volume management will improve effusion No tamponade on 2D echo Hemodynamically stable Close monitoring Will need repeat echocardiogram in the next few weeks.   Hiccup- Resolved Not able to give thorazine due to QT prolongation Not able to give baclofen due to ESRD Now on reglan Q8h PRN  Normocytic anemia Likely anemia of chronic disease.  No evidence of overt bleeding.    Incidental L lung nodule Recommend follow up chest CT every 6-12 months to monitor nodules  DVT prophylaxis: heparin Code Status: Full Family Communication: None present  Status is: INpt  Dispo: The patient is from: Home              Anticipated d/c is to: CIR was declined by insurance.  Home with home health when medically stable           Patient remains medically stable for discharge  Consultants:  Neuro, nephro  Procedures:  None  Antimicrobials:  Discontinued 10/6   Subjective: Patient felt well this morning but when he was mobilized he felt nauseated and lightheaded.  No headaches were reported.   Objective: Vitals:   12/03/21 1930 12/04/21 0000 12/04/21 0400 12/04/21 0959  BP: (!) 144/76 (!) 156/81 (!) 142/83 139/84  Pulse: 60 (!) 59 66 70  Resp: 18 18 18 18   Temp: 97.9 F (36.6 C) 97.6 F (36.4 C) 98.4 F (36.9 C) 98.6 F (37 C)  TempSrc: Oral Axillary  Oral Oral  SpO2: 98% 98% 100% 100%  Weight:      Height:        Intake/Output Summary (Last 24 hours) at 12/04/2021 1109 Last data filed at 12/04/2021 0400 Gross per 24 hour  Intake 220 ml  Output 500 ml  Net -280 ml    Filed Weights   12/01/21 1217 12/03/21 0723 12/03/21 1117  Weight: 78.3 kg 78.1 kg 77.6 kg    Examination:  General appearance: Awake alert.  In no distress Resp: Clear to auscultation bilaterally.  Normal effort Cardio: S1-S2 is normal regular.  No S3-S4.  No rubs murmurs or bruit GI: Abdomen is soft.  Nontender nondistended.  Bowel sounds are present normal.  No masses organomegaly Extremities: No edema.      Data Reviewed: I have personally reviewed following labs and imaging studies  CBC: Recent Labs  Lab 11/28/21 0334 11/29/21 0643 12/01/21 0929 12/03/21 0749  WBC 7.4 7.7 6.1 4.3  HGB 8.8* 9.4* 9.3* 9.0*  HCT 29.2* 30.4* 30.7* 29.4*  MCV 73.7* 73.8* 74.2* 73.3*  PLT 200 216 268 106    Basic Metabolic Panel: Recent Labs  Lab 12/01/21 0929 12/03/21 0749  NA 134* 133*  K 3.5 3.3*  CL 96* 95*  CO2 27 27  GLUCOSE 109* 131*  BUN 38* 34*  CREATININE 8.21* 7.50*  CALCIUM 8.2* 8.2*  PHOS 3.7 3.9    GFR: Estimated Creatinine Clearance: 12.5 mL/min (A) (by C-G formula based on SCr of 7.5 mg/dL (H)). Liver Function Tests: Recent Labs  Lab 12/01/21 0929 12/03/21 0749  ALBUMIN 2.5* 2.4*     CBG: Recent Labs  Lab 11/29/21 1003 12/01/21 0309 12/03/21 1731  GLUCAP 116* 119* 150*      No results found for this or any previous visit (from the past 240 hour(s)).        Radiology Studies: No results found.      Scheduled Meds:  amiodarone  200 mg Oral Daily   amLODipine  10 mg Oral Daily   calcium acetate  1,334 mg Oral TID WC   carvedilol  25 mg Oral BID   Chlorhexidine Gluconate Cloth  6 each Topical Daily   cloNIDine  0.3 mg Oral Q8H   darbepoetin (ARANESP) injection - DIALYSIS  150 mcg Intravenous Q Tue-HD    dorzolamide-timolol  1 drop Right Eye BID   heparin injection (subcutaneous)  5,000 Units Subcutaneous Q8H   irbesartan  300 mg Oral Daily   levETIRAcetam  250 mg Oral Q T,Th,Sa-HD   levETIRAcetam  750 mg Oral BID   pantoprazole  40 mg Oral Daily   pneumococcal 20-valent conjugate vaccine  0.5 mL Intramuscular Tomorrow-1000   prednisoLONE acetate  1 drop Left Eye Q4H while awake   senna-docusate  1 tablet Oral BID   tamsulosin  0.4 mg Oral Daily   Continuous Infusions:     LOS: 13 days    Bonnielee Haff,  [facility]  If 7PM-7AM, please contact night-coverage [email or web address]  12/04/2021, 11:09 AM

## 2021-12-04 NOTE — Plan of Care (Signed)
  Problem: Education: Goal: Knowledge of disease or condition will improve Outcome: Progressing Goal: Knowledge of secondary prevention will improve (SELECT ALL) Outcome: Progressing Goal: Knowledge of patient specific risk factors will improve (INDIVIDUALIZE FOR PATIENT) Outcome: Progressing   Problem: Education: Goal: Knowledge of General Education information will improve Description: Including pain rating scale, medication(s)/side effects and non-pharmacologic comfort measures Outcome: Progressing   Problem: Health Behavior/Discharge Planning: Goal: Ability to manage health-related needs will improve Outcome: Progressing   Problem: Clinical Measurements: Goal: Ability to maintain clinical measurements within normal limits will improve Outcome: Progressing Goal: Will remain free from infection Outcome: Progressing Goal: Diagnostic test results will improve Outcome: Progressing Goal: Respiratory complications will improve Outcome: Progressing Goal: Cardiovascular complication will be avoided Outcome: Progressing

## 2021-12-04 NOTE — Progress Notes (Signed)
Mobility Specialist: Progress Note   12/04/21 1126  Mobility  Activity Dangled on edge of bed  Level of Assistance Moderate assist, patient does 50-74%  Assistive Device None  Activity Response Tolerated poorly  $Mobility charge 1 Mobility   Pt independent with bed mobility. C/o nausea and dizziness upon sitting EOB; BP stable. Observed pt with R lateral lean and decreased verbal response; BP elevated with MAP (115). Pt required modA to correct R lateral lean. Observed LUE and LLE termor with "seizure like activity." RN in room and aware; refer to RN notes for exact BPs/vitals.   [place] Stephanee Barcomb Mobility Specialist Secure Chat Only

## 2021-12-04 NOTE — Progress Notes (Signed)
Patient ID: [name] Cooper., male   DOB: Jan 05, 1971, 51 y.o.   MRN: [id number] S: Had seizure before he could be discharged yesterday.  Not feeling that well this morning. O:BP 139/84 (BP Location: Right Arm)   Pulse 70   Temp 98.6 F (37 C) (Oral)   Resp 18   Ht 6\' 3"  (1.905 m)   Wt 77.6 kg   SpO2 100%   BMI 21.38 kg/m   Intake/Output Summary (Last 24 hours) at 12/04/2021 1330 Last data filed at 12/04/2021 0400 Gross per 24 hour  Intake 220 ml  Output --  Net 220 ml   Intake/Output: I/O last 3 completed shifts: In: 380 [P.O.:380] Out: 500 [Other:500]  Intake/Output this shift:  No intake/output data recorded. Weight change:  Gen:NAD CVS: RRR Resp:CTA Abd: +BS,soft, NT/ND Ext: no edema, LUE AVF +T/B  Recent Labs  Lab 12/01/21 0929 12/03/21 0749 12/04/21 1112  NA 134* 133* 134*  K 3.5 3.3* 3.7  CL 96* 95* 97*  CO2 27 27 26   GLUCOSE 109* 131* 145*  BUN 38* 34* 27*  CREATININE 8.21* 7.50* 6.77*  ALBUMIN 2.5* 2.4* 2.5*  CALCIUM 8.2* 8.2* 8.5*  PHOS 3.7 3.9  --   AST  --   --  10*  ALT  --   --  6   Liver Function Tests: Recent Labs  Lab 12/01/21 0929 12/03/21 0749 12/04/21 1112  AST  --   --  10*  ALT  --   --  6  ALKPHOS  --   --  78  BILITOT  --   --  0.3  PROT  --   --  5.8*  ALBUMIN 2.5* 2.4* 2.5*   No results for input(s): "LIPASE", "AMYLASE" in the last 168 hours. No results for input(s): "AMMONIA" in the last 168 hours. CBC: Recent Labs  Lab 11/28/21 0334 11/29/21 0643 12/01/21 0929 12/03/21 0749 12/04/21 1112  WBC 7.4 7.7 6.1 4.3 6.5  HGB 8.8* 9.4* 9.3* 9.0* 9.5*  HCT 29.2* 30.4* 30.7* 29.4* 31.2*  MCV 73.7* 73.8* 74.2* 73.3* 73.9*  PLT 200 216 268 244 218   Cardiac Enzymes: No results for input(s): "CKTOTAL", "CKMB", "CKMBINDEX", "TROPONINI" in the last 168 hours. CBG: Recent Labs  Lab 11/29/21 1003 12/01/21 0309 12/03/21 1731  GLUCAP 116* 119* 150*    Iron Studies: No results for input(s): "IRON", "TIBC",  "TRANSFERRIN", "FERRITIN" in the last 72 hours. Studies/Results: CT HEAD WO CONTRAST (5MM)  Result Date: 12/04/2021 CLINICAL DATA:  Stroke, follow up seizure decreased volume EXAM: CT HEAD WITHOUT CONTRAST TECHNIQUE: Contiguous axial images were obtained from the base of the skull through the vertex without intravenous contrast. RADIATION DOSE REDUCTION: This exam was performed according to the departmental dose-optimization program which includes automated exposure control, adjustment of the mA and/or kV according to patient size and/or use of iterative reconstruction technique. COMPARISON:  CT head November 25, 2021. FINDINGS: Brain: Decreased volume and attenuation of small amount of hemorrhage layering within the ventricles. Similar size of the ventricles. No evidence of acute large vascular territory infarct, mass lesion, midline shift or extra-axial fluid collection. Vascular: Calcific atherosclerosis. Skull: No acute fracture. Sinuses/Orbits: Clear sinuses.  Chronic left orbital changes. Other: No mastoid effusions. IMPRESSION: Decreased volume and attenuation of small amount of hemorrhage layering within the ventricles. Similar size of the ventricles. Electronically Signed   By: Margaretha Sheffield M.D.   On: 12/04/2021 12:36    amiodarone  200 mg Oral Daily   amLODipine  10 mg Oral Daily   calcium acetate  1,334 mg Oral TID WC   carvedilol  25 mg Oral BID   Chlorhexidine Gluconate Cloth  6 each Topical Daily   cloNIDine  0.3 mg Oral Q8H   darbepoetin (ARANESP) injection - DIALYSIS  150 mcg Intravenous Q Tue-HD   dorzolamide-timolol  1 drop Right Eye BID   heparin injection (subcutaneous)  5,000 Units Subcutaneous Q8H   irbesartan  300 mg Oral Daily   levETIRAcetam  250 mg Oral Q T,Th,Sa-HD   levETIRAcetam  750 mg Oral BID   pantoprazole  40 mg Oral Daily   pneumococcal 20-valent conjugate vaccine  0.5 mL Intramuscular Tomorrow-1000   prednisoLONE acetate  1 drop Left Eye Q4H while awake    senna-docusate  1 tablet Oral BID   tamsulosin  0.4 mg Oral Daily    BMET    Component Value Date/Time   NA 134 (L) 12/04/2021 1112   K 3.7 12/04/2021 1112   CL 97 (L) 12/04/2021 1112   CO2 26 12/04/2021 1112   GLUCOSE 145 (H) 12/04/2021 1112   BUN 27 (H) 12/04/2021 1112   CREATININE 6.77 (H) 12/04/2021 1112   CALCIUM 8.5 (L) 12/04/2021 1112   GFRNONAA 9 (L) 12/04/2021 1112   GFRAA 5 (L) 08/27/2019 1540   CBC    Component Value Date/Time   WBC 6.5 12/04/2021 1112   RBC 4.22 12/04/2021 1112   HGB 9.5 (L) 12/04/2021 1112   HCT 31.2 (L) 12/04/2021 1112   PLT 218 12/04/2021 1112   MCV 73.9 (L) 12/04/2021 1112   MCH 22.5 (L) 12/04/2021 1112   MCHC 30.4 12/04/2021 1112   RDW 20.5 (H) 12/04/2021 1112   LYMPHSABS 1.5 10/24/2021 0805   MONOABS 0.6 10/24/2021 0805   EOSABS 0.3 10/24/2021 0805   BASOSABS 0.0 10/24/2021 0805    Dialysis Orders: Center: [facility]  on TTS . EDW 87 kg HD Bath 2K/2.5Ca  Time 3:30 Heparin . Access Left Gillette catheter, RUE AVF 1800 units IVP then 400 units/hr BFR 400 DFR 800    Micera 150 mcg IVP every 2 weeks, Venofer 50 mg IV TTS, calcitriol 0.5 mcg TTS   Assessment/Plan: Intraventricular hemorrhage at fourth ventricle/lateral ventricles: Likely related to uncontrolled hypertension and ongoing management per neurology service.  Antithrombotic therapy contraindicated with ICH. ESRD: We will continue TTS schedule via right IJ TDC; next dialysis due 10/10.  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 Anemia: Hgb currently lower without any overt losses, begin ESA.   CKD-MBD: Calcium level acceptable, will monitor phosphorus trends. Nutrition: Continue current renal diet/fluid restriction with monitoring albumin levels to decide on need for nutritional supplementation. Hypertension/Volume: Blood pressure improving on amlodipine, carvedilol, irbesartan and clonidine.  Has had  some orthostatic hypotension which interferes with cooperation with PT/OT.  Kept even with HD on 12/01/21 and did better.  Not taking much in by mouth.  Limit UF.  Will need new, lower EDW when discharged.  Seizure disorder- on Keppra.  Donetta Potts, MD [facility]

## 2021-12-04 NOTE — Progress Notes (Signed)
OT Cancellation Note  Patient Details Name: Thaddeaus Monica. MRN: [id number] DOB: 09-Jan-1971   Cancelled Treatment:    Reason Eval/Treat Not Completed: Patient at procedure or test/ unavailable;Medical issues which prohibited therapy;Other (comment) (CT in room preparing for STAT CT. RN reports seizure yesterday and light headedness with mobility specialist this morning. RN requesting wait until afternoon. WIll return as schedule allows.)  Shanda Howells, OTR/L [address] [facility] Acute Rehabilitation Office: [phone]   Lula Olszewski 12/04/2021, 11:22 AM

## 2021-12-04 NOTE — Progress Notes (Signed)
Initial Nutrition Assessment  DOCUMENTATION CODES:  Severe malnutrition in context of chronic illness  INTERVENTION:  Liberalize diet to regular with no added salt to encourage PO intake in the setting of malnutrition and poor PO Nepro Shake po BID, each supplement provides 425 kcal and 19 grams protein Prosource Plus BID to provide 100kcal and 15g of protein Renavite daily  NUTRITION DIAGNOSIS:   Severe Malnutrition related to chronic illness (ESRD om HD) as evidenced by severe fat depletion, severe muscle depletion.  GOAL:   Patient will meet greater than or equal to 90% of their needs  MONITOR:   PO intake, Labs, I & O's, Weight trends, Supplement acceptance  REASON FOR ASSESSMENT:   Malnutrition Screening Tool    ASSESSMENT:   Pt with hx of CVA, atrial fibrillation, CHF, hx NSTEMI, CAD, type 2 DM, ESRD on HD, GERD, gastroparesis, HTN, HLD, hx CVA presented to ED with malaise. Found to be in HTN crisis with acute IVH.  Pt resting in bed at the time of assessment. States that his appetite is good, but he is having a difficult time ordering food.   Usual dry weight is 85kg, currently down to 77.6kg and pt reports that they are still trying to pull fluid off of him in HD. Significant muscle and fat depletions present on exam suggestive of undernourishment and severe malnutrition.   Liberalized diet and added nutrition supplements to encourage adequate PO intake.   Average Meal Intake: 10/4-10/12: 54% intake x 5 recorded meals  Nutritionally Relevant Medications: Scheduled Meds:  (feeding supplement) PROSource Plus  30 mL Oral BID BM   calcium acetate  1,334 mg Oral TID WC   feeding supplement (NEPRO CARB STEADY)  237 mL Oral BID BM   multivitamin  1 tablet Oral QHS   pantoprazole  40 mg Oral Daily   senna-docusate  1 tablet Oral BID   Labs Reviewed: Na 134, chloride 97 BUN 27, creatinine 6.77  NUTRITION - FOCUSED PHYSICAL EXAM:  Flowsheet Row Most Recent Value   Orbital Region Mild depletion  Upper Arm Region Severe depletion  Thoracic and Lumbar Region Severe depletion  Buccal Region Mild depletion  Temple Region Moderate depletion  Clavicle Bone Region Mild depletion  Clavicle and Acromion Bone Region Mild depletion  Scapular Bone Region Mild depletion  Dorsal Hand Severe depletion  Patellar Region Severe depletion  Anterior Thigh Region Severe depletion  Posterior Calf Region Severe depletion  Edema (RD Assessment) None  Hair Reviewed  Eyes Reviewed  Mouth Reviewed  Skin Reviewed  Nails Reviewed   Diet Order:   Diet Order             Diet regular Room service appropriate? Yes; Fluid consistency: Thin  Diet effective now           Diet - low sodium heart healthy                   EDUCATION NEEDS:   Education needs have been addressed  Skin:  Skin Assessment: Reviewed RN Assessment (stage 2, scrotum, buttocks)  Last BM:  10/9  Height:   Ht Readings from Last 1 Encounters:  11/26/21 6\' 3"  (1.905 m)    Weight:   Wt Readings from Last 1 Encounters:  12/03/21 77.6 kg    Ideal Body Weight:  89.1 kg  BMI:  Body mass index is 21.38 kg/m.  Estimated Nutritional Needs:   Kcal:  2300-2500 kcal/d  Protein:  115-130g/d  Fluid:  1L+UOP  Ranell Patrick, RD, LDN Clinical Dietitian RD pager # available in [place]  After hours/weekend pager # available in [facility]

## 2021-12-04 NOTE — Progress Notes (Signed)
Occupational Therapy Treatment Patient Details Name: [name]. MRN: [id number] DOB: [birth year] Today's Date: 12/04/2021   History of present illness 51 yo male admitted 9/30 ICH 4th ventricle and lateral ventricles.  PMH includes: ESRD on HD TTS, HTN, amputation L big toe, L eye absent, CVA, afib on plavix and Eliquis, Glaucoma, seizure, PVD, and CHF.   OT comments  Pt with stat CT this morning after seizure-like activity yesterday and onset of R lateral lean sitting EOB this morning. CT negative and RN requesting OT/Mobility see together to obtain orthostatic vital signs (See below ADL general comments). Pt requiring min A for bed mobility and sit<>stand transfer during session. Onset of dizziness following sit<>stand transfer and RN present. Pt additionally with R lateral lean after returning to EOB, requiring min A to maintain upright. Returning to supine for pt safety. RN present and aware. Pt remains confident that family can provide care needed, however, due to need for up to min physical support continue to assess to assure pt has appropriate support at home.    Recommendations for follow up therapy are one component of a multi-disciplinary discharge planning process, led by the attending physician.  Recommendations may be updated based on patient status, additional functional criteria and insurance authorization.    Follow Up Recommendations  Home health OT    Assistance Recommended at Discharge Frequent or constant Supervision/Assistance  Patient can return home with the following  A little help with walking and/or transfers;A little help with bathing/dressing/bathroom;Assistance with cooking/housework;Direct supervision/assist for medications management;Direct supervision/assist for financial management;Assist for transportation;Help with stairs or ramp for entrance   Equipment Recommendations  BSC/3in1;Wheelchair (measurements OT);Wheelchair cushion (measurements OT)     Recommendations for Other Services      Precautions / Restrictions Precautions Precautions: Fall Precaution Comments: SBP goal <150, seizure, HD port R chest, HD fistulas BUE, orthostatic hypotension Restrictions Weight Bearing Restrictions: No       Mobility Bed Mobility Overal bed mobility: Needs Assistance Bed Mobility: Supine to Sit, Sit to Supine     Supine to sit: HOB elevated, Min assist Sit to supine: Mod assist   General bed mobility comments: Assist for lifting trunk; assist for elevating BLE following onset of R lateral lean    Transfers Overall transfer level: Needs assistance Equipment used: Rolling walker (2 wheels) Transfers: Sit to/from Stand Sit to Stand: Min assist, From elevated surface (+2 provided this session due to RN request as pt with onset of R lateral lean with light dizziness this morning)                 Balance Overall balance assessment: Needs assistance Sitting-balance support: Feet supported Sitting balance-Leahy Scale: Poor Sitting balance - Comments: Trouble staying in seated position after onset of dizziness. Minor elevation in BP. See ADL general comments   Standing balance support: Bilateral upper extremity supported, Reliant on assistive device for balance Standing balance-Leahy Scale: Poor                             ADL either performed or assessed with clinical judgement   ADL Overall ADL's : Needs assistance/impaired     Grooming: Set up;Sitting;Wash/dry face Grooming Details (indicate cue type and reason): Washing face at EOB                 Toilet Transfer: Minimal assistance;+2 for physical assistance Toilet Transfer Details (indicate cue type and reason): +2 for safety this session  due to recent seizure yesterday and decresed periods of focus with mobility this morning.         Functional mobility during ADLs: Minimal assistance General ADL Comments: Up to mod A for sitting balance with  onset of dizziness. Pt with elevated BP this morning during mobility session and seizure like activity resulting in STAT CT; therefore monitoring BP throughout session. Supine: 137/81(98); seated EOB: 332/951(884) and RN entering room agreeable for pt to attempt to stand with OT and mobility tech for safety; Following stand in which pt took 2 steps and self initiated sitting back down BP was 147/106 (119). VSS throughout remainder of session and RN present. Pt with decreased balance sitting EOB with R lateral lean and one episode of blank stare ~3 second requiring repeated question from mobility specialist regarding whether pt would like A to bring ble into bed. Discontinued session for safety due to elevated BPs and intermittent brief episode of decresed responsiveness.    Extremity/Trunk Assessment              Vision   Additional Comments: Poor vision. Keeps L eye closed, but per chart, L eye blindness   Perception     Praxis      Cognition Arousal/Alertness: Awake/alert Behavior During Therapy: Flat affect Overall Cognitive Status: Impaired/Different from baseline Area of Impairment: Attention, Following commands, Safety/judgement, Awareness, Problem solving, Memory                   Current Attention Level: Sustained Memory: Decreased short-term memory Following Commands: Follows one step commands with increased time, Follows multi-step commands inconsistently Safety/Judgement: Decreased awareness of deficits, Decreased awareness of safety Awareness: Emergent Problem Solving: Slow processing, Difficulty sequencing, Requires verbal cues, Requires tactile cues General Comments: Pt with limited verbalizations throughout session. pt        Exercises      Shoulder Instructions       General Comments Pt continues to report that family can provide needed level of assist at home.    Pertinent Vitals/ Pain       Pain Assessment Pain Assessment: Faces Faces Pain Scale:  Hurts even more Pain Location: generalized discomfort; pain in BLE with mobility; dizzy Pain Descriptors / Indicators: Discomfort, Grimacing, Headache Pain Intervention(s): Monitored during session, Limited activity within patient's tolerance  Home Living                                          Prior Functioning/Environment              Frequency  Min 2X/week        Progress Toward Goals  OT Goals(current goals can now be found in the care plan section)  Progress towards OT goals: Progressing toward goals  Acute Rehab OT Goals Patient Stated Goal: Not to have any more seizures OT Goal Formulation: With patient Time For Goal Achievement: 12/07/21 Potential to Achieve Goals: Good ADL Goals Pt Will Perform Grooming: with min assist;sitting Pt Will Transfer to Toilet: with +2 assist;with mod assist;bedside commode;stand pivot transfer Additional ADL Goal #1: Pt will complete bed mobility mod (A) as precursor to adls. Additional ADL Goal #2: Pt will static sit EOB min (A) as precursor to adls  Plan Discharge plan needs to be updated    Co-evaluation                 AM-PAC  OT "6 Clicks" Daily Activity     Outcome Measure   Help from another person eating meals?: A Little Help from another person taking care of personal grooming?: A Little Help from another person toileting, which includes using toliet, bedpan, or urinal?: A Little Help from another person bathing (including washing, rinsing, drying)?: A Lot Help from another person to put on and taking off regular upper body clothing?: A Little Help from another person to put on and taking off regular lower body clothing?: A Lot 6 Click Score: 16    End of Session Equipment Utilized During Treatment: Gait belt;Rolling walker (2 wheels)  OT Visit Diagnosis: Unsteadiness on feet (R26.81);Muscle weakness (generalized) (M62.81);Low vision, both eyes (H54.2);Other symptoms and signs involving  cognitive function   Activity Tolerance Patient limited by fatigue   Patient Left in bed;with call bell/phone within reach;with bed alarm set;with nursing/sitter in room   Nurse Communication Mobility status;Other (comment) (RN present for >80% of session)        Time: 9432-7614 OT Time Calculation (min): 24 min  Charges: OT General Charges $OT Visit: 1 Visit OT Treatments $Self Care/Home Management : 8-22 mins $Therapeutic Activity: 8-22 mins  Shanda Howells, OTR/L [facility] Acute Rehabilitation Office: [phone]   Lula Olszewski 12/04/2021, 5:34 PM

## 2021-12-04 NOTE — Progress Notes (Addendum)
STROKE TEAM PROGRESS NOTE   INTERVAL HISTORY Called back to see patient again. He was being discharged yesterday when he suddenly had a seizure. He was given 1000mg  IV Keppra and maintenance dose increased to 750mg  BID from 500mg   Today he is awake and alert, oriented. He is moving everything spontaneously and equal. Neurological exam is non focal. He c/o of dizziness and lightheadedness when sitting up. This is most likely due to the extra Keppra given yesterday versus orthostasis.    Vitals:   12/04/21 0000 12/04/21 0400 12/04/21 0959 12/04/21 1328  BP: (!) 156/81 (!) 142/83 139/84 (!) 145/75  Pulse: (!) 59 66 70 62  Resp: 18 18 18 18   Temp: 97.6 F (36.4 C) 98.4 F (36.9 C) 98.6 F (37 C) 98.1 F (36.7 C)  TempSrc: Axillary Oral Oral Oral  SpO2: 98% 100% 100% 99%  Weight:      Height:       CBC:  Recent Labs  Lab 12/03/21 0749 12/04/21 1112  WBC 4.3 6.5  HGB 9.0* 9.5*  HCT 29.4* 31.2*  MCV 73.3* 73.9*  PLT 244 607    Basic Metabolic Panel:  Recent Labs  Lab 12/01/21 0929 12/03/21 0749 12/04/21 1112  NA 134* 133* 134*  K 3.5 3.3* 3.7  CL 96* 95* 97*  CO2 27 27 26   GLUCOSE 109* 131* 145*  BUN 38* 34* 27*  CREATININE 8.21* 7.50* 6.77*  CALCIUM 8.2* 8.2* 8.5*  PHOS 3.7 3.9  --     Lipid Panel:  No results for input(s): "CHOL", "TRIG", "HDL", "CHOLHDL", "VLDL", "LDLCALC" in the last 168 hours.  HgbA1c:  No results for input(s): "HGBA1C" in the last 168 hours.  Urine Drug Screen: No results for input(s): "LABOPIA", "COCAINSCRNUR", "LABBENZ", "AMPHETMU", "THCU", "LABBARB" in the last 168 hours.  Alcohol Level No results for input(s): "ETH" in the last 168 hours.  IMAGING past 24 hours CT HEAD WO CONTRAST (5MM)  Result Date: 12/04/2021 CLINICAL DATA:  Stroke, follow up seizure decreased volume EXAM: CT HEAD WITHOUT CONTRAST TECHNIQUE: Contiguous axial images were obtained from the base of the skull through the vertex without intravenous contrast. RADIATION  DOSE REDUCTION: This exam was performed according to the departmental dose-optimization program which includes automated exposure control, adjustment of the mA and/or kV according to patient size and/or use of iterative reconstruction technique. COMPARISON:  CT head November 25, 2021. FINDINGS: Brain: Decreased volume and attenuation of small amount of hemorrhage layering within the ventricles. Similar size of the ventricles. No evidence of acute large vascular territory infarct, mass lesion, midline shift or extra-axial fluid collection. Vascular: Calcific atherosclerosis. Skull: No acute fracture. Sinuses/Orbits: Clear sinuses.  Chronic left orbital changes. Other: No mastoid effusions. IMPRESSION: Decreased volume and attenuation of small amount of hemorrhage layering within the ventricles. Similar size of the ventricles. Electronically Signed   By: Margaretha Sheffield M.D.   On: 12/04/2021 12:36    PHYSICAL EXAM General:  Drowsy, well-nourished, well-developed patient s/p removal of left eye in no acute distress Respiratory:  Regular, unlabored respirations  NEURO:  Patient AAO x 3, tired today but awakens to voice, orientated to age and place.  No aphasia, no significant dysarthria, follow simple commands.  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  Bilateral upper extremity 4/5, bilateral lower extremity 3/5.  Sensation symmetrical subjectively and finger-to-nose grossly intact.  Gait not tested   ASSESSMENT/PLAN [name]. is a  51 y.o. 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  He presented to the ED and was found to be hypertensive with SBP in the 200s.  Head CT revealed ICH in the fourth ventricle.  Eliquis was reversed with Andexxa. Nephrology and infectious disease on board. Dialysis TTS.  Cleviprex off, bp well controlled with PO medications today.   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 The pericardial effusion is circumferential. There is no evidence of cardiac tamponade LDL 34 HgbA1c 4.5 VTE prophylaxis - SCDs clopidogrel 75 mg daily and Eliquis (apixaban) daily prior to admission, now on No antithrombotic secondary to IVH Therapy recommendations:  CIR Disposition:  pending  Hypertensive emergency Home meds:  carvedilol 25 mg BID, amlodipine 5 mg daily, clonidine  0.1 mg TID PRN Now on amlodipine 10, Coreg 25 twice daily, clonidine 0.1->0.2->0.3 q8 hours Was on hydralazine 100 Q8h -> orthostasis 10/5 with therapy ->d/c hydralazine Keep SBP <160 Long-term BP goal normotensive  History of stroke and seizure 12/2020 admitted for syncope and unresponsiveness, CT negative 01/2021 admitted for transient slurred speech.  MRI showed left hippocampus punctate infarct.  MRA and carotid Doppler unremarkable, EEG no seizure.  LDL 38, A1c 5.7, EF 55 to 60%.  Discharged on Eliquis, Plavix and Lipitor 40 03/2021 admitted for left upper extremity weakness, CT negative.  CTA head and neck poor visualization but no significant finding.  MRI showed bilateral MCA/ACA watershed infarcts as well as left splenium small infarct.  EEG no seizure 06/2021 admitted for jerking movement for weeks.  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  MRI no acute infarct 07/2021 admitted for seizure-like activity in the setting of  recent dialysis and glucose 77.  Long-term EEG monitoring no seizure.  Put on Keppra and discontinue Klonopin. Had a seizure yesterday 10/12 and was given 1000mg  IV Keppra and dose increased to 750mg  from 500mg   Fevers Persistent fevers Tmax 102->101.7->100.4->101.6->101.1-100.8-100.5 1 of 2 blood cultures growing MRSE, typically a contaminant, however given risk (HD catheter) of real infection and continue fevers, put on Vanc.  Repeat blood culture 4/4 neg ID consulted -> d/c vanco  Hyperlipidemia Home meds: atorvastatin 80 mg daily LDL 34, goal < 70 Hold off statin for now due to acute IVH Consider to resume statin at discharge  ESRD on HD Patient receives dialysis T, Th, Sat Appreciate nephrology input Continue dialysis on outpatient schedule  Large pericardial effusion Found on both CT chest and 2D echo Likely due to ESRD No tamponade on 2D echo Hemodynamically stable Close monitoring  Hiccup- Resolved Not able to give thorazine due to QT prolongation Not able to give baclofen due to ESRD Now on reglan Q8h PRN monitoring  Other Stroke Risk Factors PVD dCHF  Other Active Problems Nodules in left lung Chest CT shows ground glass nodules in bilateral lungs Recommend follow up chest CT every 6-12 months to monitor nodules  Recommendations:  Keep patient until  tomorrow after HD Continue Keppra @ 750mg  BID  Neurology follow up in 2 months after discharge   Hospital day # 13  Patient seen and examined by NP/APP with MD. MD to update note as needed.   Beulah Gandy DNP, ACNPC-AG  [facility]   I have personally obtained history,examined this patient, reviewed notes, independently viewed imaging studies, participated in medical decision making and plan of care.ROS completed by me personally and pertinent positives fully documented  I have made any additions or clarifications directly to the above note. Agree with note above.  Patient had a breakthrough  seizure yesterday and agree with increasing maintenance dose of Keppra to 750 twice daily.  Check orthostatic vital signs.  Mobilize out of bed.  Discharge home tomorrow if stable.  Discussed with patient and Dr. [place] Pink and answered questions.  Patient may also consider possible participation in the ASPIRE stroke study (Eliquis versus aspirin in patients with intracerebral hemorrhage with A-fib ) if interested  greater than 50% time during this 50-minute visit was spent in counseling and coordination of care about his breakthrough seizure and discussion about seizure management and answering questions.  Stroke team will sign off.  Kindly call for questions  Antony Contras, MD Medical Director [facility] Pager: [phone] 12/04/2021 3:37 PM

## 2021-12-05 DIAGNOSIS — I161 Hypertensive emergency: Secondary | ICD-10-CM | POA: Diagnosis not present

## 2021-12-05 DIAGNOSIS — I629 Nontraumatic intracranial hemorrhage, unspecified: Secondary | ICD-10-CM | POA: Diagnosis not present

## 2021-12-05 DIAGNOSIS — R569 Unspecified convulsions: Secondary | ICD-10-CM | POA: Diagnosis not present

## 2021-12-05 MED ORDER — HEPARIN SODIUM (PORCINE) 1000 UNIT/ML IJ SOLN
INTRAMUSCULAR | Status: AC
  Administered 2021-12-05: 1000 [IU]
  Filled 2021-12-05: qty 4

## 2021-12-05 MED ORDER — SENNOSIDES-DOCUSATE SODIUM 8.6-50 MG PO TABS: 1.0000 | ORAL_TABLET | Freq: Two times a day (BID) | ORAL | Status: DC | PRN

## 2021-12-05 MED ORDER — LEVETIRACETAM 750 MG PO TABS: 750.0000 mg | ORAL_TABLET | Freq: Two times a day (BID) | ORAL | 2 refills | Status: DC

## 2021-12-05 MED ORDER — GERHARDT'S BUTT CREAM
TOPICAL_CREAM | Freq: Every day | CUTANEOUS | Status: DC
  Filled 2021-12-05: qty 1

## 2021-12-05 NOTE — Progress Notes (Signed)
Patient ID: [name]., male   DOB: 08/19/1970, 51 y.o.   MRN: [id number] S: Was constipated and given laxative with resultant diarrhea. O:BP (!) 148/84 (BP Location: Right Leg)   Pulse 61   Temp 98.5 F (36.9 C) (Oral)   Resp 18   Ht 6\' 3"  (1.905 m)   Wt 77.6 kg   SpO2 100%   BMI 21.38 kg/m   Intake/Output Summary (Last 24 hours) at 12/05/2021 1118 Last data filed at 12/04/2021 1600 Gross per 24 hour  Intake 644 ml  Output --  Net 644 ml   Intake/Output: I/O last 3 completed shifts: In: 1219 [P.O.:1219] Out: -   Intake/Output this shift:  No intake/output data recorded. Weight change:  Gen: NAD CVS: RRR Resp: CTA Abd: +BS, soft, NT/ND Ext: no edema  Recent Labs  Lab 12/01/21 0929 12/03/21 0749 12/04/21 1112  NA 134* 133* 134*  K 3.5 3.3* 3.7  CL 96* 95* 97*  CO2 27 27 26   GLUCOSE 109* 131* 145*  BUN 38* 34* 27*  CREATININE 8.21* 7.50* 6.77*  ALBUMIN 2.5* 2.4* 2.5*  CALCIUM 8.2* 8.2* 8.5*  PHOS 3.7 3.9  --   AST  --   --  10*  ALT  --   --  6   Liver Function Tests: Recent Labs  Lab 12/01/21 0929 12/03/21 0749 12/04/21 1112  AST  --   --  10*  ALT  --   --  6  ALKPHOS  --   --  78  BILITOT  --   --  0.3  PROT  --   --  5.8*  ALBUMIN 2.5* 2.4* 2.5*   No results for input(s): "LIPASE", "AMYLASE" in the last 168 hours. No results for input(s): "AMMONIA" in the last 168 hours. CBC: Recent Labs  Lab 11/29/21 0643 12/01/21 0929 12/03/21 0749 12/04/21 1112  WBC 7.7 6.1 4.3 6.5  HGB 9.4* 9.3* 9.0* 9.5*  HCT 30.4* 30.7* 29.4* 31.2*  MCV 73.8* 74.2* 73.3* 73.9*  PLT 216 268 244 218   Cardiac Enzymes: No results for input(s): "CKTOTAL", "CKMB", "CKMBINDEX", "TROPONINI" in the last 168 hours. CBG: Recent Labs  Lab 11/29/21 1003 12/01/21 0309 12/03/21 1731  GLUCAP 116* 119* 150*    Iron Studies: No results for input(s): "IRON", "TIBC", "TRANSFERRIN", "FERRITIN" in the last 72 hours. Studies/Results: CT HEAD WO CONTRAST  (5MM)  Result Date: 12/04/2021 CLINICAL DATA:  Stroke, follow up seizure decreased volume EXAM: CT HEAD WITHOUT CONTRAST TECHNIQUE: Contiguous axial images were obtained from the base of the skull through the vertex without intravenous contrast. RADIATION DOSE REDUCTION: This exam was performed according to the departmental dose-optimization program which includes automated exposure control, adjustment of the mA and/or kV according to patient size and/or use of iterative reconstruction technique. COMPARISON:  CT head November 25, 2021. FINDINGS: Brain: Decreased volume and attenuation of small amount of hemorrhage layering within the ventricles. Similar size of the ventricles. No evidence of acute large vascular territory infarct, mass lesion, midline shift or extra-axial fluid collection. Vascular: Calcific atherosclerosis. Skull: No acute fracture. Sinuses/Orbits: Clear sinuses.  Chronic left orbital changes. Other: No mastoid effusions. IMPRESSION: Decreased volume and attenuation of small amount of hemorrhage layering within the ventricles. Similar size of the ventricles. Electronically Signed   By: Margaretha Sheffield M.D.   On: 12/04/2021 12:36    (feeding supplement) PROSource Plus  30 mL Oral BID BM   amiodarone  200 mg Oral Daily   amLODipine  10  mg Oral Daily   calcium acetate  1,334 mg Oral TID WC   carvedilol  25 mg Oral BID   Chlorhexidine Gluconate Cloth  6 each Topical Daily   cloNIDine  0.3 mg Oral Q8H   darbepoetin (ARANESP) injection - DIALYSIS  150 mcg Intravenous Q Tue-HD   dorzolamide-timolol  1 drop Right Eye BID   feeding supplement (NEPRO CARB STEADY)  237 mL Oral BID BM   Gerhardt's butt cream   Topical Daily   heparin injection (subcutaneous)  5,000 Units Subcutaneous Q8H   irbesartan  300 mg Oral Daily   levETIRAcetam  250 mg Oral Q T,Th,Sa-HD   levETIRAcetam  750 mg Oral BID   multivitamin  1 tablet Oral QHS   pantoprazole  40 mg Oral Daily   pneumococcal 20-valent  conjugate vaccine  0.5 mL Intramuscular Tomorrow-1000   polyethylene glycol  17 g Oral Daily   prednisoLONE acetate  1 drop Left Eye Q4H while awake   tamsulosin  0.4 mg Oral Daily    BMET    Component Value Date/Time   NA 134 (L) 12/04/2021 1112   K 3.7 12/04/2021 1112   CL 97 (L) 12/04/2021 1112   CO2 26 12/04/2021 1112   GLUCOSE 145 (H) 12/04/2021 1112   BUN 27 (H) 12/04/2021 1112   CREATININE 6.77 (H) 12/04/2021 1112   CALCIUM 8.5 (L) 12/04/2021 1112   GFRNONAA 9 (L) 12/04/2021 1112   GFRAA 5 (L) 08/27/2019 1540   CBC    Component Value Date/Time   WBC 6.5 12/04/2021 1112   RBC 4.22 12/04/2021 1112   HGB 9.5 (L) 12/04/2021 1112   HCT 31.2 (L) 12/04/2021 1112   PLT 218 12/04/2021 1112   MCV 73.9 (L) 12/04/2021 1112   MCH 22.5 (L) 12/04/2021 1112   MCHC 30.4 12/04/2021 1112   RDW 20.5 (H) 12/04/2021 1112   LYMPHSABS 1.5 10/24/2021 0805   MONOABS 0.6 10/24/2021 0805   EOSABS 0.3 10/24/2021 0805   BASOSABS 0.0 10/24/2021 0805      Dialysis Orders: Center: [facility]  on TTS . EDW 87 kg HD Bath 2K/2.5Ca  Time 3:30 Heparin . Access Left [place] catheter, RUE AVF 1800 units IVP then 400 units/hr BFR 400 DFR 800    Micera 150 mcg IVP every 2 weeks, Venofer 50 mg IV TTS, calcitriol 0.5 mcg TTS   Assessment/Plan: Intraventricular hemorrhage at fourth ventricle/lateral ventricles: Likely related to uncontrolled hypertension and ongoing management per neurology service.  Antithrombotic therapy contraindicated with ICH. ESRD: We will continue TTS schedule via right IJ TDC; next dialysis due 10/10.  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 Anemia: Hgb currently lower without any overt losses, begin ESA.   CKD-MBD: Calcium level acceptable, will monitor phosphorus trends. Nutrition: Continue current renal diet/fluid restriction with monitoring albumin levels to decide on need for nutritional  supplementation. Hypertension/Volume: Blood pressure improving on amlodipine, carvedilol, irbesartan and clonidine.  Has had some orthostatic hypotension which interferes with cooperation with PT/OT.  Kept even with HD on 12/01/21 and did better.  Not taking much in by mouth.  Limit UF.  Will need new, lower EDW when discharged.  Seizure disorder- on Keppra. Disposition - hopeful discharge to home today after HD.   Donetta Potts, MD [facility]

## 2021-12-05 NOTE — Progress Notes (Signed)
PROGRESS NOTE   [name].  WUG:[id number] DOB: [birth year] DOA: 11/21/2021 PCP: [name], MD  Brief Narrative:  [name] Treanor. is a 51 y.o. male with a history of stroke, atrial fibrillation, NSTEMI and CHF with chronic on Plavix and apixaban who has been having general malaise for the past few days.  Also a cough.  He has had some nausea and vomiting over the past 2 days, but only mild headache.  When he presented to the emergency department, his blood pressure was in the 200s.  Given the nausea and vomiting and severe hypertension, the ER physician at [facility] decided to perform a head CT which demonstrates a fourth ventricle bleed. Transitioned to [facility] care 11/27/21.  Assessment & Plan:  Seizure in the setting of known seizure disorder While he was being prepared for discharge patient had seizure activity on 10/12.   He was loaded with Keppra and his maintenance dose of Keppra was increased.   Seen by neurology.  Underwent CT head which did not show any new findings.  Neurology agreed with increasing the maintenance dose of Keppra.   Neurologically he appears to be stable this morning.  No further recurrence of seizure activity. If he does well with hemodialysis today he may be able to go home this afternoon.  Interventricular hemorrhage - bilateral lateral ventricles/4th ventricle without hydrocephaly Complicated by uncontrolled hypertension Confirmed on imaging -  CTA head & neck No evidence of aneurysm, no LVO or high grade stenosis Follow up CT 10/1, 10/4 stable IVH 2D Echo EF 65 to 70%, large pericardial effusion - no evidence of tamponade LDL 34 HgbA1c 4.5 VTE prophylaxis - SCDs Previously on plavix/eliquis - on hold now given active IVH   Hypertensive emergency Home meds:  carvedilol 25 mg BID, amlodipine 5 mg daily, clonidine  0.1 mg TID PRN Currently on amlodipine 10, coreg 25, clonidine 0.3 three times daily, irbesartan Previously requiring Cleviprex -  now off Keep SBP <160 - Long-term BP goal normotensive Blood pressure is well controlled  Fevers, resolving Unclear etiology -ID was consulted.  Antibiotics were held.  Thought to be central in origin.  Fevers appear to be subsiding.  Cultures of been negative. MRSE considered contaminant  Hyperlipidemia Home meds: atorvastatin 80 mg daily LDL 34, goal < 70 Resume statin at DC   ESRD on HD Patient receives dialysis T, Th, Sat Nephrology continues to follow   Large pericardial effusion Found on both CT chest and 2D echo Likely due to ESRD - hoping volume management will improve effusion No tamponade on 2D echo Hemodynamically stable Close monitoring Will need repeat echocardiogram in the next few weeks.   Hiccup- Resolved Not able to give thorazine due to QT prolongation Not able to give baclofen due to ESRD Now on reglan Q8h PRN  Normocytic anemia Likely anemia of chronic disease.  No evidence of overt bleeding.    Incidental L lung nodule Recommend follow up chest CT every 6-12 months to monitor nodules.  We will send ambulatory referral to pulmonology.  Constipation Was given laxatives with which she had multiple bowel movements overnight.  Abdomen is benign.  Patient reassured.   DVT prophylaxis: heparin Code Status: Full Family Communication: None present  Status is: INpt  Dispo: The patient is from: Home              Anticipated d/c is to: CIR was declined by insurance.  Home with home health when medically stable  Patient remains medically stable for discharge  Consultants:  Neuro, nephro  Procedures:  None  Antimicrobials:  Discontinued 10/6   Subjective: Reports multiple bowel movements overnight.  Denies any abdominal pain nausea vomiting.  Hoping to go home this afternoon.   Objective: Vitals:   12/04/21 2300 12/05/21 0326 12/05/21 0327 12/05/21 0845  BP: (!) 146/80 (!) 147/85 (!) 141/78 (!) 157/85  Pulse: 63 98 63 63  Resp: 17 17  18 18   Temp: 99.3 F (37.4 C) 98.2 F (36.8 C) 98.2 F (36.8 C) 98.5 F (36.9 C)  TempSrc: Oral Oral Oral Oral  SpO2: 95% 96% 97% 99%  Weight:      Height:        Intake/Output Summary (Last 24 hours) at 12/05/2021 1007 Last data filed at 12/04/2021 1600 Gross per 24 hour  Intake 999 ml  Output --  Net 999 ml    Filed Weights   12/01/21 1217 12/03/21 0723 12/03/21 1117  Weight: 78.3 kg 78.1 kg 77.6 kg    Examination:  General appearance: Awake alert.  In no distress Resp: Clear to auscultation bilaterally.  Normal effort Cardio: S1-S2 is normal regular.  No S3-S4.  No rubs murmurs or bruit GI: Abdomen is soft.  Nontender nondistended.  Bowel sounds are present normal.  No masses organomegaly     Data Reviewed: I have personally reviewed following labs and imaging studies  CBC: Recent Labs  Lab 11/29/21 0643 12/01/21 0929 12/03/21 0749 12/04/21 1112  WBC 7.7 6.1 4.3 6.5  HGB 9.4* 9.3* 9.0* 9.5*  HCT 30.4* 30.7* 29.4* 31.2*  MCV 73.8* 74.2* 73.3* 73.9*  PLT 216 268 244 607    Basic Metabolic Panel: Recent Labs  Lab 12/01/21 0929 12/03/21 0749 12/04/21 1112  NA 134* 133* 134*  K 3.5 3.3* 3.7  CL 96* 95* 97*  CO2 27 27 26   GLUCOSE 109* 131* 145*  BUN 38* 34* 27*  CREATININE 8.21* 7.50* 6.77*  CALCIUM 8.2* 8.2* 8.5*  PHOS 3.7 3.9  --     GFR: Estimated Creatinine Clearance: 13.9 mL/min (A) (by C-G formula based on SCr of 6.77 mg/dL (H)). Liver Function Tests: Recent Labs  Lab 12/01/21 0929 12/03/21 0749 12/04/21 1112  AST  --   --  10*  ALT  --   --  6  ALKPHOS  --   --  78  BILITOT  --   --  0.3  PROT  --   --  5.8*  ALBUMIN 2.5* 2.4* 2.5*     CBG: Recent Labs  Lab 11/29/21 1003 12/01/21 0309 12/03/21 1731  GLUCAP 116* 119* 150*      No results found for this or any previous visit (from the past 240 hour(s)).     Radiology Studies: CT HEAD WO CONTRAST (5MM)  Result Date: 12/04/2021 CLINICAL DATA:  Stroke, follow up  seizure decreased volume EXAM: CT HEAD WITHOUT CONTRAST TECHNIQUE: Contiguous axial images were obtained from the base of the skull through the vertex without intravenous contrast. RADIATION DOSE REDUCTION: This exam was performed according to the departmental dose-optimization program which includes automated exposure control, adjustment of the mA and/or kV according to patient size and/or use of iterative reconstruction technique. COMPARISON:  CT head November 25, 2021. FINDINGS: Brain: Decreased volume and attenuation of small amount of hemorrhage layering within the ventricles. Similar size of the ventricles. No evidence of acute large vascular territory infarct, mass lesion, midline shift or extra-axial fluid collection. Vascular: Calcific atherosclerosis. Skull: No  acute fracture. Sinuses/Orbits: Clear sinuses.  Chronic left orbital changes. Other: No mastoid effusions. IMPRESSION: Decreased volume and attenuation of small amount of hemorrhage layering within the ventricles. Similar size of the ventricles. Electronically Signed   By: Margaretha Sheffield M.D.   On: 12/04/2021 12:36        Scheduled Meds:  (feeding supplement) PROSource Plus  30 mL Oral BID BM   amiodarone  200 mg Oral Daily   amLODipine  10 mg Oral Daily   calcium acetate  1,334 mg Oral TID WC   carvedilol  25 mg Oral BID   Chlorhexidine Gluconate Cloth  6 each Topical Daily   cloNIDine  0.3 mg Oral Q8H   darbepoetin (ARANESP) injection - DIALYSIS  150 mcg Intravenous Q Tue-HD   dorzolamide-timolol  1 drop Right Eye BID   feeding supplement (NEPRO CARB STEADY)  237 mL Oral BID BM   heparin injection (subcutaneous)  5,000 Units Subcutaneous Q8H   irbesartan  300 mg Oral Daily   levETIRAcetam  250 mg Oral Q T,Th,Sa-HD   levETIRAcetam  750 mg Oral BID   multivitamin  1 tablet Oral QHS   pantoprazole  40 mg Oral Daily   pneumococcal 20-valent conjugate vaccine  0.5 mL Intramuscular Tomorrow-1000   polyethylene glycol  17 g Oral  Daily   prednisoLONE acetate  1 drop Left Eye Q4H while awake   tamsulosin  0.4 mg Oral Daily   Continuous Infusions:     LOS: 14 days    [name],  [facility]  If 7PM-7AM, please contact night-coverage [email or web address]  12/05/2021, 10:07 AM

## 2021-12-05 NOTE — Progress Notes (Signed)
Received patient in bed to unit.  Alert and oriented.  Informed consent signed and in chart.   Treatment initiated: 1247 Treatment completed: 1600  Patient tolerated well.  Transported back to the room  Alert, without acute distress.  Hand-off given to patient's nurse.   Access used: Catheter Access issues: none  Total UF removed: 400 Medication(s) given: none Post HD VS: 98.1, 177/87(114), HR-57, RR-13, SP02-100 Post HD weight: 80.2kg   [facility]

## 2021-12-16 ENCOUNTER — Encounter (INDEPENDENT_AMBULATORY_CARE_PROVIDER_SITE_OTHER): Payer: Medicare HMO

## 2021-12-16 ENCOUNTER — Ambulatory Visit (INDEPENDENT_AMBULATORY_CARE_PROVIDER_SITE_OTHER): Payer: Medicare HMO | Admitting: Nurse Practitioner

## 2021-12-21 ENCOUNTER — Encounter (INDEPENDENT_AMBULATORY_CARE_PROVIDER_SITE_OTHER): Payer: Self-pay

## 2022-01-04 ENCOUNTER — Encounter (INDEPENDENT_AMBULATORY_CARE_PROVIDER_SITE_OTHER): Payer: Medicare HMO

## 2022-01-04 ENCOUNTER — Ambulatory Visit (INDEPENDENT_AMBULATORY_CARE_PROVIDER_SITE_OTHER): Payer: Medicare HMO | Admitting: Nurse Practitioner

## 2022-01-09 ENCOUNTER — Other Ambulatory Visit: Payer: Self-pay

## 2022-01-09 ENCOUNTER — Emergency Department (HOSPITAL_COMMUNITY): Payer: Medicare HMO

## 2022-01-09 ENCOUNTER — Emergency Department (HOSPITAL_COMMUNITY)
Admission: EM | Admit: 2022-01-09 | Discharge: 2022-01-09 | Disposition: A | Discharge status: Home / Self Care | Payer: Medicare HMO | Attending: Emergency Medicine | Admitting: Emergency Medicine

## 2022-01-09 ENCOUNTER — Encounter (HOSPITAL_COMMUNITY): Payer: Self-pay

## 2022-01-09 DIAGNOSIS — R519 Headache, unspecified: Secondary | ICD-10-CM | POA: Diagnosis not present

## 2022-01-09 DIAGNOSIS — Z992 Dependence on renal dialysis: Secondary | ICD-10-CM | POA: Insufficient documentation

## 2022-01-09 DIAGNOSIS — N186 End stage renal disease: Secondary | ICD-10-CM | POA: Insufficient documentation

## 2022-01-09 DIAGNOSIS — I509 Heart failure, unspecified: Secondary | ICD-10-CM | POA: Insufficient documentation

## 2022-01-09 DIAGNOSIS — M7989 Other specified soft tissue disorders: Secondary | ICD-10-CM | POA: Diagnosis present

## 2022-01-09 LAB — CBC WITH DIFFERENTIAL/PLATELET
Abs Immature Granulocytes: 0.02 10*3/uL (ref 0.00–0.07)
Basophils Absolute: 0 10*3/uL (ref 0.0–0.1)
Basophils Relative: 1 %
Eosinophils Absolute: 0.2 10*3/uL (ref 0.0–0.5)
Eosinophils Relative: 4 %
HCT: 35.2 % — ABNORMAL LOW (ref 39.0–52.0)
Hemoglobin: 10.4 g/dL — ABNORMAL LOW (ref 13.0–17.0)
Immature Granulocytes: 0 %
Lymphocytes Relative: 26 %
Lymphs Abs: 1.4 10*3/uL (ref 0.7–4.0)
MCH: 21.8 pg — ABNORMAL LOW (ref 26.0–34.0)
MCHC: 29.5 g/dL — ABNORMAL LOW (ref 30.0–36.0)
MCV: 73.6 fL — ABNORMAL LOW (ref 80.0–100.0)
Monocytes Absolute: 0.8 10*3/uL (ref 0.1–1.0)
Monocytes Relative: 15 %
Neutro Abs: 2.8 10*3/uL (ref 1.7–7.7)
Neutrophils Relative %: 54 %
Platelets: 123 10*3/uL — ABNORMAL LOW (ref 150–400)
RBC: 4.78 MIL/uL (ref 4.22–5.81)
RDW: 23.4 % — ABNORMAL HIGH (ref 11.5–15.5)
WBC: 5.2 10*3/uL (ref 4.0–10.5)
nRBC: 0 % (ref 0.0–0.2)

## 2022-01-09 LAB — BASIC METABOLIC PANEL
Anion gap: 8 (ref 5–15)
BUN: 28 mg/dL — ABNORMAL HIGH (ref 6–20)
CO2: 23 mmol/L (ref 22–32)
Calcium: 7.9 mg/dL — ABNORMAL LOW (ref 8.9–10.3)
Chloride: 106 mmol/L (ref 98–111)
Creatinine, Ser: 6.28 mg/dL — ABNORMAL HIGH (ref 0.61–1.24)
GFR, Estimated: 10 mL/min — ABNORMAL LOW (ref >60)
Glucose, Bld: 130 mg/dL — ABNORMAL HIGH (ref 70–99)
Potassium: 3.6 mmol/L (ref 3.5–5.1)
Sodium: 137 mmol/L (ref 135–145)

## 2022-01-09 LAB — CK: Total CK: 73 U/L (ref 49–397)

## 2022-01-09 NOTE — ED Notes (Signed)
Pt transported to CT ?

## 2022-01-09 NOTE — Discharge Instructions (Signed)
You are seen in the ER for headache and left upper extremity swelling.  We completed a CT scan of your brain which shows no evidence of brain bleed. We also discussed your case with a vascular doctor.  They recommend that you proceed with ultrasound as planned on Monday.  We also recommend that you keep your arm elevated.  Return to the ER if you start having severe pain, worsening swelling, numbness or tingling in your arms.

## 2022-01-09 NOTE — ED Provider Notes (Signed)
[facility] EMERGENCY DEPARTMENT Provider Note   CSN: [id number] Arrival date & time: 01/09/22  1614     History  Chief Complaint  Patient presents with   left arm pain/swelling    [name] Marsel Gail. is a 51 y.o. male.  HPI     51 y.o. male with a history of stroke, atrial fibrillation, NSTEMI and CHF with recent intracranial bleed in October comes in with 2 complaints.  Patient complains of left upper extremity swelling and also some head pressure.  Patient indicates that he has been having left upper extremity swelling for the last several weeks.  He had gone to his hemodialysis earlier today, they accessed his right chest tunneled cath.  The nursing staff or there were concerned about the swelling and advised that he go to the ER.  He thinks that perhaps the swelling is slightly worse.  He is supposed to get an ultrasound on Monday and has a follow-up appointment with vascular surgery in Wednesday.  He gets his dialysis and vascular care in [place].  Patient also complains of head pressure.  He states that throughout the day, he will have split-second episode of " whooshing noise", like " pressure being released from the tire".  He had similar symptoms last time when he was found to have hemorrhagic stroke.  Patient denies any one-sided weakness, numbness, vision change.  He continues to have some balance issues, but it is not worse than usual.  He denies any slurred speech, difficulty in swallowing.   Home Medications Prior to Admission medications   Medication Sig Start Date End Date Taking? Authorizing Provider  acetaminophen (TYLENOL) 325 MG tablet Take 1-2 tablets (325-650 mg total) by mouth every 4 (four) hours as needed for mild pain. 05/04/21  Yes Love, Ivan Anchors, PA-C  amiodarone (PACERONE) 200 MG tablet Take 1 tablet (200 mg total) by mouth every morning. 08/10/21 08/11/22 Yes Swinyer, Lanice Schwab, NP  amLODipine (NORVASC) 10 MG tablet Take 1 tablet (10 mg total) by  mouth daily. 12/03/21  Yes Bonnielee Haff, MD  atorvastatin (LIPITOR) 80 MG tablet Take 1 tablet (80 mg total) by mouth daily. Patient taking differently: Take 80 mg by mouth at bedtime. 05/04/21 04/29/22 Yes Love, Ivan Anchors, PA-C  calcium acetate (PHOSLO) 667 MG capsule Take 2 capsules (1,334 mg total) by mouth 3 (three) times daily with meals. 05/04/21  Yes Love, Ivan Anchors, PA-C  carvedilol (COREG) 25 MG tablet Take 25 mg by mouth 2 (two) times daily. 06/16/21  Yes [provider]  cholestyramine Lucrezia Starch) 4 GM/DOSE powder Take 4 g by mouth 2 (two) times daily. 10/20/21  Yes [provider]  cloNIDine (CATAPRES) 0.3 MG tablet Take 1 tablet (0.3 mg total) by mouth every 8 (eight) hours. Patient taking differently: Take 0.3 mg by mouth every 8 (eight) hours. Only if BP over 200 12/03/21  Yes Bonnielee Haff, MD  dorzolamide-timolol (COSOPT) 22.3-6.8 MG/ML ophthalmic solution Place 1 drop into the right eye 2 (two) times daily.   Yes [provider]  hydrocortisone (ANUSOL-HC) 2.5 % rectal cream Place 1 Application rectally 2 (two) times daily. 04/06/21  Yes [provider]  irbesartan (AVAPRO) 300 MG tablet Take 1 tablet (300 mg total) by mouth daily. 12/03/21  Yes Bonnielee Haff, MD  levETIRAcetam (KEPPRA) 750 MG tablet Take 1 tablet (750 mg total) by mouth 2 (two) times daily. Patient taking differently: Take 750 mg by mouth 2 (two) times daily. Twice daily Only on dialysis days and once  on all other days 12/05/21  Yes Bonnielee Haff, MD  lidocaine-prilocaine (EMLA) cream Apply topically. 12/24/21  Yes [provider]  nitroGLYCERIN (NITROSTAT) 0.4 MG SL tablet Place 1 tablet (0.4 mg total) under the tongue every 5 (five) minutes as needed for chest pain. 12/09/20 01/09/22 Yes Johnson, Clanford L, MD  ondansetron (ZOFRAN-ODT) 4 MG disintegrating tablet Take 4 mg by mouth every 12 (twelve) hours as needed for vomiting or nausea.   Yes [provider]   acarbose (PRECOSE) 50 MG tablet Take 50 mg by mouth 3 (three) times daily. Patient not taking: Reported on 01/09/2022 10/20/21   [provider]  hydrocortisone cream 1 %  05/04/21   [provider]  levETIRAcetam (KEPPRA) 250 MG tablet Take 1 tablet (250 mg total) by mouth Every Tuesday,Thursday,and Saturday with dialysis. Patient not taking: Reported on 01/09/2022 07/30/21   Nita Sells, MD  tamsulosin (FLOMAX) 0.4 MG CAPS capsule Take 0.4 mg by mouth daily. Patient not taking: Reported on 01/09/2022 05/19/21   [provider]      Allergies    Promethazine, Promethazine hcl, Other, and Malt    Review of Systems   Review of Systems  All other systems reviewed and are negative.   Physical Exam Updated Vital Signs BP (!) 179/78   Pulse (!) 42   Temp 98.6 F (37 C) (Oral)   Resp 13   Ht 6\' 3"  (1.905 m)   Wt 79.4 kg   SpO2 99%   BMI 21.87 kg/m  Physical Exam Vitals and nursing note reviewed.  Constitutional:      Appearance: He is well-developed.  HENT:     Head: Atraumatic.  Eyes:     Extraocular Movements: Extraocular movements intact.     Pupils: Pupils are equal, round, and reactive to light.     Comments: Left eye lid is droopy  Cardiovascular:     Rate and Rhythm: Normal rate.  Pulmonary:     Effort: Pulmonary effort is normal.  Musculoskeletal:     Cervical back: Neck supple.     Comments: Left upper extremity swollen. Fistula has a thrill No skin discoloration noted  Skin:    General: Skin is warm.  Neurological:     Mental Status: He is alert and oriented to person, place, and time.     Cranial Nerves: No cranial nerve deficit.     Sensory: No sensory deficit.     Motor: No weakness.     Coordination: Coordination normal.     ED Results / Procedures / Treatments   Labs (all labs ordered are listed, but only abnormal results are displayed) Labs Reviewed  BASIC METABOLIC PANEL - Abnormal; Notable for the following  components:      Result Value   Glucose, Bld 130 (*)    BUN 28 (*)    Creatinine, Ser 6.28 (*)    Calcium 7.9 (*)    GFR, Estimated 10 (*)    All other components within normal limits  CBC WITH DIFFERENTIAL/PLATELET - Abnormal; Notable for the following components:   Hemoglobin 10.4 (*)    HCT 35.2 (*)    MCV 73.6 (*)    MCH 21.8 (*)    MCHC 29.5 (*)    RDW 23.4 (*)    Platelets 123 (*)    All other components within normal limits  CK    EKG None  Radiology CT Head Wo Contrast  Result Date: 01/09/2022 CLINICAL DATA:  Head pressure. Hx of  hemorrhage. c/o left arm pain/swelling. Pt does dialysis on Tuesday/Thursday/Saturday with last dialysis tx today for full 3.5 hours. Pt states he "doesn't feel right" and that he gets lightheaded that comes .*comment was truncated*History of hemorrhage, "head pressure" EXAM: CT HEAD WITHOUT CONTRAST TECHNIQUE: Contiguous axial images were obtained from the base of the skull through the vertex without intravenous contrast. RADIATION DOSE REDUCTION: This exam was performed according to the departmental dose-optimization program which includes automated exposure control, adjustment of the mA and/or kV according to patient size and/or use of iterative reconstruction technique. COMPARISON:  CT 10/13/200 23, MRI 11/21/2021 FINDINGS: Brain: No evidence of acute infarction, hemorrhage, hydrocephalus, extra-axial collection or mass lesion/mass effect. Small amount of intermediate density fluid in the posterior RIGHT lateral ventricle similar to comparison recent CT. Small amount of intraventricular hemorrhage noted on multiple recent comparison exams. (Image 16/3). Vascular: No hyperdense vessel or unexpected calcification. Skull: Normal. Negative for fracture or focal lesion. Sinuses/Orbits: Other: None. IMPRESSION: 1. No interval change from recent exams. 2. Small amount of hemorrhage layers within the posterior dependent ventricles seen on multiple recent  comparison exams. 3. No hydrocephalus. Electronically Signed   By: Suzy Bouchard M.D.   On: 01/09/2022 19:01    Procedures Procedures    Medications Ordered in ED Medications - No data to display  ED Course/ Medical Decision Making/ A&P Clinical Course as of 01/09/22 1957  Sat Jan 09, 2022  1954 Discussed case with Dr. Doren Custard, vascular surgery.  I discussed with him that patient has worsening left upper extremity swelling that has been now present over several months, with fistula placed in June.  I discussed with him that patient is supposed to get ultrasound on Monday.  I also discussed that with the swelling, patient has a palpable thrill and there is no systemic symptoms from the patient.  Dr. Doren Custard clears the patient for outpatient work-up on Monday as planned.  [AN]    Clinical Course User Index [AN] Varney Biles, MD                           Medical Decision Making This patient presents to the ED with chief complaint(s) of intermittent dizziness, intermittent " whooshing noise" in his head with with pertinent past medical history of hemorrhagic stroke and seizures just a few months back.he also complains of left upper extremity swelling with history of AV fistula placement on the same side, with pending ultrasound on Monday. the complaint involves an extensive differential diagnosis and also carries with it a high risk of complications and morbidity.    The differential diagnosis for the nonspecific head discomfort includes worsening bleed/intracranial bleed, cerebral aneurysm, residual effects from his previous stroke and bleed.  The differential diagnosis for left upper extremity swelling includes pseudoaneurysm, DVT, cellulitis, graft complication.  Patient has a palpable thrill, which is reassuring.  No evidence of compartment syndrome.  The initial plan is to get basic labs, get CT scan of the brain and discussed the case with vascular surgery.  I reviewed patient's  previous work-up including CT angiogram.  Patient did not have any aneurysm when he was found to have intracerebral bleed.  Bleed thought to be hypertensive.   Additional history obtained: Records reviewed notes from vascular surgery, including fistula operative note, fistulogram that was done in July and the last office visit in August.  Independent labs interpretation:  The following labs were independently interpreted: CBC and BMP are reassuring.  Independent visualization and interpretation of imaging: - I independently visualized the following imaging with scope of interpretation limited to determining acute life threatening conditions related to emergency care: CT scan of the brain, which revealed no acute brain bleed.  Evidence of previous bleed noted.  Treatment and Reassessment: Patient reassessed after CT scan.  Results discussed with him.  Informed him that I am still awaiting call from vascular surgery.  Consultation: - Consulted or discussed management/test interpretation with external professional: Vascular surgery, they recommend that patient can proceed with outpatient ultrasound as planned for Monday.   Problems Addressed: ESRD (end stage renal disease) on dialysis [facility]): chronic illness or injury Left upper extremity swelling: undiagnosed new problem with uncertain prognosis Nonintractable headache, unspecified chronicity pattern, unspecified headache type: acute illness or injury  Amount and/or Complexity of Data Reviewed Labs: ordered. Radiology: ordered.    Final Clinical Impression(s) / ED Diagnoses Final diagnoses:  Nonintractable headache, unspecified chronicity pattern, unspecified headache type  Left upper extremity swelling  ESRD (end stage renal disease) on dialysis [facility])    Rx / DC Orders ED Discharge Orders     None         Varney Biles, MD 01/09/22 [id number]

## 2022-01-09 NOTE — ED Triage Notes (Signed)
Patient arrives from home c/o left arm pain/swelling. Pt does dialysis on Tuesday/Thursday/Saturday with last dialysis tx today for full 3.5 hours. Pt states a fistula was placed in left arm several months ago and that the swelling in left arm significantly increased over the past week. Pt states he "doesn't feel right" and that he gets lightheaded that comes and goes. Patient states the last time "he felt like this was when he had the stroke." Pt states he had a "brain bleed last month."

## 2022-01-09 NOTE — ED Notes (Signed)
Pt d/c home per MD order. Discharged home with wife, discharge summary reviewed, verbalize understanding. Off unit via WC- No s/s of acute distress noted at discharge.

## 2022-01-12 ENCOUNTER — Emergency Department (HOSPITAL_COMMUNITY)
Admission: EM | Admit: 2022-01-12 | Discharge: 2022-01-12 | Disposition: A | Discharge status: Home / Self Care | Payer: Medicare HMO | Attending: Emergency Medicine | Admitting: Emergency Medicine

## 2022-01-12 ENCOUNTER — Other Ambulatory Visit: Payer: Self-pay

## 2022-01-12 DIAGNOSIS — Z992 Dependence on renal dialysis: Secondary | ICD-10-CM | POA: Insufficient documentation

## 2022-01-12 DIAGNOSIS — R001 Bradycardia, unspecified: Secondary | ICD-10-CM

## 2022-01-12 DIAGNOSIS — Z79899 Other long term (current) drug therapy: Secondary | ICD-10-CM | POA: Insufficient documentation

## 2022-01-12 NOTE — ED Provider Notes (Signed)
[facility] EMERGENCY DEPARTMENT  Provider Note  CSN: [id number] Arrival date & time: 01/12/22 3295  History Chief Complaint  Patient presents with   Bradycardia    [name] Johnston Maddocks. is a 51 y.o. male brought to the ED via EMS from dialysis for reported HR in the 30s. EMS reports on their arrival his HR was 72 but staff still requested transport. He is asymptomatic. Never had CP, SOB, lightheadedness. He wants to go back to complete his dialysis now.    Home Medications Prior to Admission medications   Medication Sig Start Date End Date Taking? Authorizing Provider  acarbose (PRECOSE) 50 MG tablet Take 50 mg by mouth 3 (three) times daily. Patient not taking: Reported on 01/09/2022 10/20/21   [provider]  acetaminophen (TYLENOL) 325 MG tablet Take 1-2 tablets (325-650 mg total) by mouth every 4 (four) hours as needed for mild pain. 05/04/21   Love, Ivan Anchors, PA-C  amiodarone (PACERONE) 200 MG tablet Take 1 tablet (200 mg total) by mouth every morning. 08/10/21 08/11/22  Swinyer, Lanice Schwab, NP  amLODipine (NORVASC) 10 MG tablet Take 1 tablet (10 mg total) by mouth daily. 12/03/21   Bonnielee Haff, MD  atorvastatin (LIPITOR) 80 MG tablet Take 1 tablet (80 mg total) by mouth daily. Patient taking differently: Take 80 mg by mouth at bedtime. 05/04/21 04/29/22  Love, Ivan Anchors, PA-C  calcium acetate (PHOSLO) 667 MG capsule Take 2 capsules (1,334 mg total) by mouth 3 (three) times daily with meals. 05/04/21   Love, Ivan Anchors, PA-C  carvedilol (COREG) 25 MG tablet Take 25 mg by mouth 2 (two) times daily. 06/16/21   [provider]  cholestyramine Lucrezia Starch) 4 GM/DOSE powder Take 4 g by mouth 2 (two) times daily. 10/20/21   [provider]  cloNIDine (CATAPRES) 0.3 MG tablet Take 1 tablet (0.3 mg total) by mouth every 8 (eight) hours. Patient taking differently: Take 0.3 mg by mouth every 8 (eight) hours. Only if BP over 200 12/03/21   Bonnielee Haff, MD   dorzolamide-timolol (COSOPT) 22.3-6.8 MG/ML ophthalmic solution Place 1 drop into the right eye 2 (two) times daily.    [provider]  hydrocortisone (ANUSOL-HC) 2.5 % rectal cream Place 1 Application rectally 2 (two) times daily. 04/06/21   [provider]  hydrocortisone cream 1 %  05/04/21   [provider]  irbesartan (AVAPRO) 300 MG tablet Take 1 tablet (300 mg total) by mouth daily. 12/03/21   Bonnielee Haff, MD  levETIRAcetam (KEPPRA) 250 MG tablet Take 1 tablet (250 mg total) by mouth Every Tuesday,Thursday,and Saturday with dialysis. Patient not taking: Reported on 01/09/2022 07/30/21   Nita Sells, MD  levETIRAcetam (KEPPRA) 750 MG tablet Take 1 tablet (750 mg total) by mouth 2 (two) times daily. Patient taking differently: Take 750 mg by mouth 2 (two) times daily. Twice daily Only on dialysis days and once on all other days 12/05/21   Bonnielee Haff, MD  lidocaine-prilocaine (EMLA) cream Apply topically. 12/24/21   [provider]  nitroGLYCERIN (NITROSTAT) 0.4 MG SL tablet Place 1 tablet (0.4 mg total) under the tongue every 5 (five) minutes as needed for chest pain. 12/09/20 01/09/22  Johnson, Clanford L, MD  ondansetron (ZOFRAN-ODT) 4 MG disintegrating tablet Take 4 mg by mouth every 12 (twelve) hours as needed for vomiting or nausea.    [provider]  tamsulosin (FLOMAX) 0.4 MG CAPS capsule Take 0.4 mg by mouth daily. Patient not taking: Reported on 01/09/2022 05/19/21  [provider]     Allergies    Promethazine, Promethazine hcl, Other, and Malt   Review of Systems   Review of Systems Please see HPI for pertinent positives and negatives  Physical Exam BP 135/72   Pulse 85   Temp 98.3 F (36.8 C)   Resp 19   Ht 6\' 3"  (1.905 m)   Wt 79.5 kg   SpO2 100%   BMI 21.91 kg/m   Physical Exam Vitals and nursing note reviewed.  Constitutional:      Appearance: Normal appearance.  HENT:     Head:  Normocephalic and atraumatic.     Nose: Nose normal.     Mouth/Throat:     Mouth: Mucous membranes are moist.  Eyes:     Extraocular Movements: Extraocular movements intact.     Conjunctiva/sclera: Conjunctivae normal.  Cardiovascular:     Rate and Rhythm: Normal rate.  Pulmonary:     Effort: Pulmonary effort is normal.     Breath sounds: Normal breath sounds.     Comments: Dialysis access in R upper chest without signs of bleeding or infection Abdominal:     General: Abdomen is flat.     Palpations: Abdomen is soft.     Tenderness: There is no abdominal tenderness.  Musculoskeletal:        General: No swelling. Normal range of motion.     Cervical back: Neck supple.     Comments: Dialysis fistula in RUE without palpable thrill  Skin:    General: Skin is warm and dry.  Neurological:     General: No focal deficit present.     Mental Status: He is alert.  Psychiatric:        Mood and Affect: Mood normal.     ED Results / Procedures / Treatments   EKG None  Procedures Procedures  Medications Ordered in the ED Medications - No data to display  Initial Impression and Plan  Patient here with reported asymptomatic bradycardia at dialysis has normal vitals now and wants to go back to dialysis to complete his treatment. He is currently feeling well, no SOB or feeling dizzy. No further ED workup is needed.   ED Course       MDM Rules/Calculators/A&P Medical Decision Making Problems Addressed: Asymptomatic bradycardia: self-limited or minor problem    Final Clinical Impression(s) / ED Diagnoses Final diagnoses:  Asymptomatic bradycardia    Rx / DC Orders ED Discharge Orders     None        Truddie Hidden, MD 01/12/22 [phone]

## 2022-01-12 NOTE — ED Notes (Signed)
Notified [facility] of patient needing transportation back to [facility] dialysis.

## 2022-01-12 NOTE — ED Triage Notes (Signed)
EMS states they were called out by [facility] dialysis, reported pt had heart rate of 30. EMS states pt HR was 74 upon arrival. Pt states this has happened before and they said their machine doesn't always read accurately. Pt denies any symptoms and would like to be transported back to finish his dialysis.

## 2022-01-13 ENCOUNTER — Ambulatory Visit (INDEPENDENT_AMBULATORY_CARE_PROVIDER_SITE_OTHER): Payer: Medicare HMO

## 2022-01-13 DIAGNOSIS — Z992 Dependence on renal dialysis: Secondary | ICD-10-CM | POA: Diagnosis not present

## 2022-01-13 DIAGNOSIS — N186 End stage renal disease: Secondary | ICD-10-CM

## 2022-01-18 ENCOUNTER — Telehealth (INDEPENDENT_AMBULATORY_CARE_PROVIDER_SITE_OTHER): Payer: Self-pay

## 2022-01-18 ENCOUNTER — Ambulatory Visit (INDEPENDENT_AMBULATORY_CARE_PROVIDER_SITE_OTHER): Payer: Medicare HMO | Admitting: Nurse Practitioner

## 2022-01-18 ENCOUNTER — Encounter (INDEPENDENT_AMBULATORY_CARE_PROVIDER_SITE_OTHER): Payer: Self-pay | Admitting: Nurse Practitioner

## 2022-01-18 VITALS — BP 130/72 | HR 60 | Resp 16

## 2022-01-18 DIAGNOSIS — N186 End stage renal disease: Secondary | ICD-10-CM | POA: Diagnosis not present

## 2022-01-18 DIAGNOSIS — E1165 Type 2 diabetes mellitus with hyperglycemia: Secondary | ICD-10-CM | POA: Diagnosis not present

## 2022-01-18 DIAGNOSIS — Z992 Dependence on renal dialysis: Secondary | ICD-10-CM | POA: Diagnosis not present

## 2022-01-18 DIAGNOSIS — I10 Essential (primary) hypertension: Secondary | ICD-10-CM

## 2022-01-18 DIAGNOSIS — Z794 Long term (current) use of insulin: Secondary | ICD-10-CM

## 2022-01-18 NOTE — Telephone Encounter (Signed)
Spoke with the patient and he is scheduled with Dr. Lucky Cowboy on 01/21/22 with a 8:00 am [facility]. Pre-procedure instructions were discussed and patient stated he understood.

## 2022-01-20 ENCOUNTER — Encounter (INDEPENDENT_AMBULATORY_CARE_PROVIDER_SITE_OTHER): Payer: Self-pay | Admitting: Nurse Practitioner

## 2022-01-20 ENCOUNTER — Other Ambulatory Visit (INDEPENDENT_AMBULATORY_CARE_PROVIDER_SITE_OTHER): Payer: Self-pay | Admitting: Nurse Practitioner

## 2022-01-20 ENCOUNTER — Telehealth (INDEPENDENT_AMBULATORY_CARE_PROVIDER_SITE_OTHER): Payer: Self-pay

## 2022-01-20 ENCOUNTER — Other Ambulatory Visit (INDEPENDENT_AMBULATORY_CARE_PROVIDER_SITE_OTHER): Payer: Self-pay

## 2022-01-20 MED ORDER — OXYCODONE-ACETAMINOPHEN 5-325 MG PO TABS: 1.0000 | ORAL_TABLET | ORAL | 0 refills | Status: DC | PRN

## 2022-01-20 NOTE — Telephone Encounter (Signed)
Patient called in stating his left arm is swollen and feels like its bursting, hot to the touch and very painful. After speaking with Eulogio Ditch NP, the patient was offered pain medication or he could go to the ED for evaluation. Patient was given this recommendation and requested that pain medication be called in. Eulogio Ditch NP was notified.

## 2022-01-20 NOTE — H&P (View-Only) (Signed)
Subjective:    Patient ID: [name]., male    DOB: 04/29/1970, 51 y.o.   MRN: [id number] Chief Complaint  Patient presents with   Follow-up    Ultrasound follow up    The patient is a 51 year old male well-known to our practice that presents for sooner follow-up due to pain and discomfort in his left upper extremity.  He notes that he has had the pain and swelling and discomfort going on for several weeks.  The pain and swelling has increased to the point where it is tolerable or bearable for the patient.  The patient previously had this issue with a central venous stenosis.  The patient currently has a PermCath in place and utilizes the PermCath for dialysis.  The PermCath is currently in his right chest.  The patient has a flow volume of 1322 but with no significant stenosis noted on ultrasound    Review of Systems  Cardiovascular:        Arm swelling  All other systems reviewed and are negative.      Objective:   Physical Exam Vitals reviewed.  HENT:     Head: Normocephalic.  Cardiovascular:     Rate and Rhythm: Normal rate.     Pulses:          Carotid pulses are 1+ on the left side.    Arteriovenous access: Left arteriovenous access is present.    Comments: Good thrill by heart palpitations solitary extremity swelling Pulmonary:     Effort: Pulmonary effort is normal.  Skin:    General: Skin is warm and dry.  Neurological:     Mental Status: He is alert and oriented to person, place, and time.  Psychiatric:        Mood and Affect: Mood normal.        Behavior: Behavior normal.        Thought Content: Thought content normal.        Judgment: Judgment normal.     BP 130/72 (BP Location: Right Arm)   Pulse 60   Resp 16   Past Medical History:  Diagnosis Date   Anxiety    a.) on BZO (clonazepam) PRN   Aortic dilatation ([place]) 11/25/2020   a.) TTE 11/25/2020: Ao root measured 38 mm. b.) TTE 04/22/2021: Ao root measured 44 mm.   Atrial fibrillation  ([place])    a.) CHA2DS2-VASc = 6 (CHF, HTN, CVA x2, prior MI, T2DM). b.) rate/rhythm maintained on oral amiodarone + carvedilol; chronically anticoagulated using dose reduced apixaban + clopidogrel   BPH (benign prostatic hyperplasia)    Cardiac arrest ([place]) 07/26/2021   a.) in setting of hyperkalemia, NSTEMI, and seizure following missed HD; pulseless and apneic --> required 1 round of CPR prior to ROSC   Cellulitis    CHF (congestive heart failure) ([place]) 07/19/2017   a.) TTE 07/19/2017: EF 75%; sev LVH; mild MR/PR/TR; G1DD. b.) TTE 11/25/2020: EF 55-60%; mod LVH; GLS -13.0; mild-mod BAE; triv MR; mild dilated PA; G2DD. c.) TTE 04/22/2021: EF 55-60%; sev LVH; sev LAE, triv MR; G1DD.   Chronic diarrhea    Chronic pain of both ankles    Coronary artery disease 02/26/2020   a.) anterior STEMI 02/26/2020 --> LHC/PCI --> EF 65%; 90% mLAD (3.5 x 30 mm Resolyte Onyx DES x 1). b.) NSTEMI 07/26/2021   Diabetes mellitus, type II ([place])    Diabetic neuropathy ([place])    Esophagitis    ESRD (end stage renal disease) on dialysis ([place])  a.) [facility]; T-Th-Sat   Frequent falls    Gait instability    Gastritis    Gastroparesis    GERD (gastroesophageal reflux disease)    H/O enucleation of left eyeball    Heart palpitations    History of kidney stones    HLD (hyperlipidemia)    Hypertension    Insomnia    a.) uses melatonin PRN   Long term current use of anticoagulant    a.) dose reduced apixaban   Long term current use of antithrombotics/antiplatelets    a.) clopidogrel   Nausea and vomiting in adult    recurrent   NSTEMI (non-ST elevated myocardial infarction) ([place]) 07/26/2021   a.) hyperkalemic from missed HD; seizures; decompensated to cardiac arrest and required CPR x 1 round prior to ROSC.   Seizure ([place])    a.) last 07/26/2021 in setting of missed HD --> hyperkalemic at 6.5 --> pulseless/apneic and required CPR; discharged home on levetiracetam.   ST elevation myocardial infarction (STEMI)  of anterior wall ([place]) 02/26/2020   a.) LHC/PCI 02/26/2020 --> EF 65%; LVEDP 11 mmHg; 90% mLAD (3.5 x 20 mm Resolute Onyx DES x 1)   Stroke ([place]) 04/2021   a.) documented as x 3 between 04/2021 and 06/2021    Social History   Socioeconomic History   Marital status: Married    Spouse name: [name]    Number of children: 2   Years of education: Not on file   Highest education level: High school graduate  Occupational History   Occupation: Disability    Comment: not employed  Tobacco Use   Smoking status: Never   Smokeless tobacco: Never  Vaping Use   Vaping Use: Never used  Substance and Sexual Activity   Alcohol use: No   Drug use: No   Sexual activity: Yes  Other Topics Concern   Not on file  Social History Narrative   Oldest son killed in car crash June 2020.    Social Determinants of Health   Financial Resource Strain: High Risk (07/05/2017)   Overall Financial Resource Strain (CARDIA)    Difficulty of Paying Living Expenses: Hard  Food Insecurity: No Food Insecurity (11/26/2021)   Hunger Vital Sign    Worried About Running Out of Food in the Last Year: Never true    Ran Out of Food in the Last Year: Never true  Transportation Needs: No Transportation Needs (11/26/2021)   PRAPARE - Hydrologist (Medical): No    Lack of Transportation (Non-Medical): No  Physical Activity: Inactive (07/05/2017)   Exercise Vital Sign    Days of Exercise per Week: 0 days    Minutes of Exercise per Session: 0 min  Stress: Stress Concern Present (07/05/2017)   [place]    Feeling of Stress : Rather much  Social Connections: Moderately Integrated (07/05/2017)   Social Connection and Isolation Panel [NHANES]    Frequency of Communication with Friends and Family: Three times a week    Frequency of Social Gatherings with Friends and Family: Once a week    Attends Religious Services: More than 4  times per year    Active Member of [employer] or Organizations: No    Attends Archivist Meetings: Never    Marital Status: Married  Human resources officer Violence: Not At Risk (11/26/2021)   Humiliation, Afraid, Rape, and Kick questionnaire    Fear of Current or Ex-Partner: No    Emotionally  Abused: No    Physically Abused: No    Sexually Abused: No    Past Surgical History:  Procedure Laterality Date   A/V FISTULAGRAM Left 08/31/2021   Procedure: A/V Fistulagram;  Surgeon: Algernon Huxley, MD;  Location: [place] [facility];  Service: Cardiovascular;  Laterality: Left;   AMPUTATION Left 03/16/2016   Procedure: AMPUTATION DIGIT LEFT HALLUX;  Surgeon: Trula Slade, DPM;  Location: [place];  Service: Podiatry;  Laterality: Left;  can start around 5    AMPUTATION Right 02/09/2021   Procedure: AMPUTATION DIGIT;  Surgeon: Sherilyn Cooter, MD;  Location: [place];  Service: Orthopedics;  Laterality: Right;   ARTHROSCOPIC REPAIR ACL Left    AV FISTULA PLACEMENT Right 05/31/2019   Procedure: Brachiocephalic AV fistula creation;  Surgeon: Algernon Huxley, MD;  Location: [name];  Service: Vascular;  Laterality: Right;   AV FISTULA PLACEMENT Left 08/13/2021   Procedure: ARTERIOVENOUS (AV) FISTULA CREATION (BRACHIALCEPHALIC);  Surgeon: Algernon Huxley, MD;  Location: [name];  Service: Vascular;  Laterality: Left;   COLONOSCOPY WITH PROPOFOL N/A 10/28/2015   Procedure: COLONOSCOPY WITH PROPOFOL;  Surgeon: Lollie Sails, MD;  Location: [facility];  Service: Endoscopy;  Laterality: N/A;   COLONOSCOPY WITH PROPOFOL N/A 10/29/2015   Procedure: COLONOSCOPY WITH PROPOFOL;  Surgeon: Lollie Sails, MD;  Location: [facility] ENDOSCOPY;  Service: Endoscopy;  Laterality: N/A;   CORONARY/GRAFT ACUTE MI REVASCULARIZATION N/A 02/26/2020   Procedure: Coronary/Graft Acute MI Revascularization;  Surgeon: Jettie Booze, MD;  Location: [place] [facility];  Service: Cardiovascular;  Laterality: N/A;    DIALYSIS/PERMA CATHETER INSERTION Right 04/26/2019   Perm Cath    DIALYSIS/PERMA CATHETER INSERTION N/A 04/26/2019   Procedure: DIALYSIS/PERMA CATHETER INSERTION;  Surgeon: Algernon Huxley, MD;  Location: [place] [facility];  Service: Cardiovascular;  Laterality: N/A;   DIALYSIS/PERMA CATHETER INSERTION N/A 05/25/2021   Procedure: DIALYSIS/PERMA CATHETER INSERTION;  Surgeon: Algernon Huxley, MD;  Location: [place] [facility];  Service: Cardiovascular;  Laterality: N/A;   DIALYSIS/PERMA CATHETER INSERTION N/A 08/31/2021   Procedure: DIALYSIS/PERMA CATHETER INSERTION;  Surgeon: Algernon Huxley, MD;  Location: [place] [facility];  Service: Cardiovascular;  Laterality: N/A;   DIALYSIS/PERMA CATHETER REMOVAL N/A 08/15/2019   Procedure: DIALYSIS/PERMA CATHETER REMOVAL;  Surgeon: Katha Cabal, MD;  Location: [place] [facility];  Service: Cardiovascular;  Laterality: N/A;   EMBOLIZATION Right 02/06/2021   Procedure: EMBOLIZATION;  Surgeon: Algernon Huxley, MD;  Location: [place] [facility];  Service: Cardiovascular;  Laterality: Right;  Right Upper Extremity Dialysis Access, Permcath Placement   ESOPHAGOGASTRODUODENOSCOPY (EGD) WITH PROPOFOL N/A 12/27/2017   Procedure: ESOPHAGOGASTRODUODENOSCOPY (EGD) WITH PROPOFOL;  Surgeon: [place], Benay Pike, MD;  Location: [name] ENDOSCOPY;  Service: Gastroenterology;  Laterality: N/A;   EYE SURGERY     INTRAVASCULAR ULTRASOUND/IVUS N/A 02/26/2020   Procedure: Intravascular Ultrasound/IVUS;  Surgeon: Jettie Booze, MD;  Location: [place] [facility];  Service: Cardiovascular;  Laterality: N/A;   LEFT HEART CATH AND CORONARY ANGIOGRAPHY N/A 02/26/2020   Procedure: LEFT HEART CATH AND CORONARY ANGIOGRAPHY;  Surgeon: Jettie Booze, MD;  Location: [place] [facility];  Service: Cardiovascular;  Laterality: N/A;   PROSTATE SURGERY  2016   TONSILECTOMY/ADENOIDECTOMY WITH MYRINGOTOMY     TONSILLECTOMY     UPPER EXTREMITY ANGIOGRAPHY Right 11/26/2020    Procedure: Upper Extremity Angiography;  Surgeon: Algernon Huxley, MD;  Location: [place] [facility];  Service: Cardiovascular;  Laterality: Right;   UPPER EXTREMITY  ANGIOGRAPHY Right 02/05/2021   Procedure: UPPER EXTREMITY ANGIOGRAPHY;  Surgeon: Algernon Huxley, MD;  Location: [place] [facility];  Service: Cardiovascular;  Laterality: Right;    Family History  Problem Relation Age of Onset   CAD Father    Stroke Father    Diabetes Mellitus II Mother    Kidney failure Mother    Schizophrenia Mother     Allergies  Allergen Reactions   Promethazine Diarrhea and Other (See Comments)    Muscle cramps   Promethazine Hcl Other (See Comments)    Cramping all over  Other reaction(s): cramp all over   Other     Other reaction(s): Unknown Other reaction(s): Unknown   Malt Other (See Comments)    Cramping       Latest Ref Rng & Units 01/09/2022    6:12 PM 12/04/2021   11:12 AM 12/03/2021    7:49 AM  CBC  WBC 4.0 - 10.5 K/uL 5.2  6.5  4.3   Hemoglobin 13.0 - 17.0 g/dL 10.4  9.5  9.0   Hematocrit 39.0 - 52.0 % 35.2  31.2  29.4   Platelets 150 - 400 K/uL 123  218  244       CMP     Component Value Date/Time   NA 137 01/09/2022 1812   K 3.6 01/09/2022 1812   CL 106 01/09/2022 1812   CO2 23 01/09/2022 1812   GLUCOSE 130 (H) 01/09/2022 1812   BUN 28 (H) 01/09/2022 1812   CREATININE 6.28 (H) 01/09/2022 1812   CALCIUM 7.9 (L) 01/09/2022 1812   PROT 5.8 (L) 12/04/2021 1112   ALBUMIN 2.5 (L) 12/04/2021 1112   AST 10 (L) 12/04/2021 1112   ALT 6 12/04/2021 1112   ALKPHOS 78 12/04/2021 1112   BILITOT 0.3 12/04/2021 1112   GFRNONAA 10 (L) 01/09/2022 1812   GFRAA 5 (L) 08/27/2019 1540     No results found.     Assessment & Plan:   1. ESRD on hemodialysis [facility]) Recommend:  The patient is experiencing increasing problems with their dialysis access.  Patient should have a fistulagram with the intention for intervention.  The intention for intervention is to restore  appropriate flow and prevent thrombosis and possible loss of the access.  As well as improve the quality of dialysis therapy.  The risks, benefits and alternative therapies were reviewed in detail with the patient.  All questions were answered.  The patient agrees to proceed with angio/intervention.    The patient will follow up with me in the office after the procedure.   2. Essential hypertension, benign Continue antihypertensive medications as already ordered, these medications have been reviewed and there are no changes at this time.  3. Uncontrolled type 2 diabetes mellitus with hyperglycemia, with long-term current use of insulin (HCC) Continue hypoglycemic medications as already ordered, these medications have been reviewed and there are no changes at this time.  Hgb A1C to be monitored as already arranged by primary service   Current Outpatient Medications on File Prior to Visit  Medication Sig Dispense Refill   acetaminophen (TYLENOL) 325 MG tablet Take 1-2 tablets (325-650 mg total) by mouth every 4 (four) hours as needed for mild pain.     amiodarone (PACERONE) 200 MG tablet Take 1 tablet (200 mg total) by mouth every morning. 90 tablet 3   amLODipine (NORVASC) 10 MG tablet Take 1 tablet (10 mg total) by mouth daily. 30 tablet 2   atorvastatin (LIPITOR) 80 MG tablet  Take 1 tablet (80 mg total) by mouth daily. (Patient taking differently: Take 80 mg by mouth at bedtime.) 30 tablet 0   calcium acetate (PHOSLO) 667 MG capsule Take 2 capsules (1,334 mg total) by mouth 3 (three) times daily with meals. 180 capsule 0   carvedilol (COREG) 25 MG tablet Take 25 mg by mouth 2 (two) times daily.     cholestyramine (QUESTRAN) 4 GM/DOSE powder Take 4 g by mouth 2 (two) times daily.     cloNIDine (CATAPRES) 0.3 MG tablet Take 1 tablet (0.3 mg total) by mouth every 8 (eight) hours. (Patient taking differently: Take 0.3 mg by mouth every 8 (eight) hours. Only if BP over 200) 90 tablet 2    dorzolamide-timolol (COSOPT) 22.3-6.8 MG/ML ophthalmic solution Place 1 drop into the right eye 2 (two) times daily.     hydrocortisone (ANUSOL-HC) 2.5 % rectal cream Place 1 Application rectally 2 (two) times daily.     irbesartan (AVAPRO) 300 MG tablet Take 1 tablet (300 mg total) by mouth daily. 30 tablet 2   levETIRAcetam (KEPPRA) 750 MG tablet Take 1 tablet (750 mg total) by mouth 2 (two) times daily. (Patient taking differently: Take 750 mg by mouth 2 (two) times daily. Twice daily Only on dialysis days and once on all other days) 60 tablet 2   lidocaine-prilocaine (EMLA) cream Apply topically.     ondansetron (ZOFRAN-ODT) 4 MG disintegrating tablet Take 4 mg by mouth every 12 (twelve) hours as needed for vomiting or nausea.     acarbose (PRECOSE) 50 MG tablet Take 50 mg by mouth 3 (three) times daily. (Patient not taking: Reported on 01/09/2022)     hydrocortisone cream 1 %  (Patient not taking: Reported on 01/09/2022)     levETIRAcetam (KEPPRA) 250 MG tablet Take 1 tablet (250 mg total) by mouth Every Tuesday,Thursday,and Saturday with dialysis. (Patient not taking: Reported on 01/09/2022) 15 tablet 1   nitroGLYCERIN (NITROSTAT) 0.4 MG SL tablet Place 1 tablet (0.4 mg total) under the tongue every 5 (five) minutes as needed for chest pain.     tamsulosin (FLOMAX) 0.4 MG CAPS capsule Take 0.4 mg by mouth daily. (Patient not taking: Reported on 01/09/2022)     No current facility-administered medications on file prior to visit.    There are no Patient Instructions on file for this visit. No follow-ups on file.   Kris Hartmann, NP

## 2022-01-20 NOTE — Progress Notes (Signed)
Subjective:    Patient ID: [name], male    DOB: [birth year], 51 y.o.   MRN: [id number] Chief Complaint  Patient presents with   Follow-up    Ultrasound follow up    The patient is a 51 year old male well-known to our practice that presents for sooner follow-up due to pain and discomfort in his left upper extremity.  He notes that he has had the pain and swelling and discomfort going on for several weeks.  The pain and swelling has increased to the point where it is tolerable or bearable for the patient.  The patient previously had this issue with a central venous stenosis.  The patient currently has a PermCath in place and utilizes the PermCath for dialysis.  The PermCath is currently in his right chest.  The patient has a flow volume of 1322 but with no significant stenosis noted on ultrasound    Review of Systems  Cardiovascular:        Arm swelling  All other systems reviewed and are negative.      Objective:   Physical Exam Vitals reviewed.  HENT:     Head: Normocephalic.  Cardiovascular:     Rate and Rhythm: Normal rate.     Pulses:          Carotid pulses are 1+ on the left side.    Arteriovenous access: Left arteriovenous access is present.    Comments: Good thrill by heart palpitations solitary extremity swelling Pulmonary:     Effort: Pulmonary effort is normal.  Skin:    General: Skin is warm and dry.  Neurological:     Mental Status: He is alert and oriented to person, place, and time.  Psychiatric:        Mood and Affect: Mood normal.        Behavior: Behavior normal.        Thought Content: Thought content normal.        Judgment: Judgment normal.     BP 130/72 (BP Location: Right Arm)   Pulse 60   Resp 16   Past Medical History:  Diagnosis Date   Anxiety    a.) on BZO (clonazepam) PRN   Aortic dilatation ([place]) 11/25/2020   a.) TTE 11/25/2020: Ao root measured 38 mm. b.) TTE 04/22/2021: Ao root measured 44 mm.   Atrial fibrillation  ([place])    a.) CHA2DS2-VASc = 6 (CHF, HTN, CVA x2, prior MI, T2DM). b.) rate/rhythm maintained on oral amiodarone + carvedilol; chronically anticoagulated using dose reduced apixaban + clopidogrel   BPH (benign prostatic hyperplasia)    Cardiac arrest ([place]) 07/26/2021   a.) in setting of hyperkalemia, NSTEMI, and seizure following missed HD; pulseless and apneic --> required 1 round of CPR prior to ROSC   Cellulitis    CHF (congestive heart failure) ([place]) 07/19/2017   a.) TTE 07/19/2017: EF 75%; sev LVH; mild MR/PR/TR; G1DD. b.) TTE 11/25/2020: EF 55-60%; mod LVH; GLS -13.0; mild-mod BAE; triv MR; mild dilated PA; G2DD. c.) TTE 04/22/2021: EF 55-60%; sev LVH; sev LAE, triv MR; G1DD.   Chronic diarrhea    Chronic pain of both ankles    Coronary artery disease 02/26/2020   a.) anterior STEMI 02/26/2020 --> LHC/PCI --> EF 65%; 90% mLAD (3.5 x 30 mm Resolyte Onyx DES x 1). b.) NSTEMI 07/26/2021   Diabetes mellitus, type II ([place])    Diabetic neuropathy ([place])    Esophagitis    ESRD (end stage renal disease) on dialysis ([place])  a.) [facility]; T-Th-Sat   Frequent falls    Gait instability    Gastritis    Gastroparesis    GERD (gastroesophageal reflux disease)    H/O enucleation of left eyeball    Heart palpitations    History of kidney stones    HLD (hyperlipidemia)    Hypertension    Insomnia    a.) uses melatonin PRN   Long term current use of anticoagulant    a.) dose reduced apixaban   Long term current use of antithrombotics/antiplatelets    a.) clopidogrel   Nausea and vomiting in adult    recurrent   NSTEMI (non-ST elevated myocardial infarction) ([place]) 07/26/2021   a.) hyperkalemic from missed HD; seizures; decompensated to cardiac arrest and required CPR x 1 round prior to ROSC.   Seizure ([place])    a.) last 07/26/2021 in setting of missed HD --> hyperkalemic at 6.5 --> pulseless/apneic and required CPR; discharged home on levetiracetam.   ST elevation myocardial infarction (STEMI)  of anterior wall ([place]) 02/26/2020   a.) LHC/PCI 02/26/2020 --> EF 65%; LVEDP 11 mmHg; 90% mLAD (3.5 x 20 mm Resolute Onyx DES x 1)   Stroke ([place]) 04/2021   a.) documented as x 3 between 04/2021 and 06/2021    Social History   Socioeconomic History   Marital status: Married    Spouse name: [name]    Number of children: 2   Years of education: Not on file   Highest education level: High school graduate  Occupational History   Occupation: Disability    Comment: not employed  Tobacco Use   Smoking status: Never   Smokeless tobacco: Never  Vaping Use   Vaping Use: Never used  Substance and Sexual Activity   Alcohol use: No   Drug use: No   Sexual activity: Yes  Other Topics Concern   Not on file  Social History Narrative   Oldest son killed in car crash June 2020.    Social Determinants of Health   Financial Resource Strain: High Risk (07/05/2017)   Overall Financial Resource Strain (CARDIA)    Difficulty of Paying Living Expenses: Hard  Food Insecurity: No Food Insecurity (11/26/2021)   Hunger Vital Sign    Worried About Running Out of Food in the Last Year: Never true    Ran Out of Food in the Last Year: Never true  Transportation Needs: No Transportation Needs (11/26/2021)   PRAPARE - Hydrologist (Medical): No    Lack of Transportation (Non-Medical): No  Physical Activity: Inactive (07/05/2017)   Exercise Vital Sign    Days of Exercise per Week: 0 days    Minutes of Exercise per Session: 0 min  Stress: Stress Concern Present (07/05/2017)   [place]    Feeling of Stress : Rather much  Social Connections: Moderately Integrated (07/05/2017)   Social Connection and Isolation Panel [NHANES]    Frequency of Communication with Friends and Family: Three times a week    Frequency of Social Gatherings with Friends and Family: Once a week    Attends Religious Services: More than 4  times per year    Active Member of [employer] or Organizations: No    Attends Archivist Meetings: Never    Marital Status: Married  Human resources officer Violence: Not At Risk (11/26/2021)   Humiliation, Afraid, Rape, and Kick questionnaire    Fear of Current or Ex-Partner: No    Emotionally  Abused: No    Physically Abused: No    Sexually Abused: No    Past Surgical History:  Procedure Laterality Date   A/V FISTULAGRAM Left 08/31/2021   Procedure: A/V Fistulagram;  Surgeon: Algernon Huxley, MD;  Location: [facility];  Service: Cardiovascular;  Laterality: Left;   AMPUTATION Left 03/16/2016   Procedure: AMPUTATION DIGIT LEFT HALLUX;  Surgeon: Trula Slade, DPM;  Location: [facility];  Service: Podiatry;  Laterality: Left;  can start around 5    AMPUTATION Right 02/09/2021   Procedure: AMPUTATION DIGIT;  Surgeon: Sherilyn Cooter, MD;  Location: [facility];  Service: Orthopedics;  Laterality: Right;   ARTHROSCOPIC REPAIR ACL Left    AV FISTULA PLACEMENT Right 05/31/2019   Procedure: Brachiocephalic AV fistula creation;  Surgeon: Algernon Huxley, MD;  Location: [name];  Service: Vascular;  Laterality: Right;   AV FISTULA PLACEMENT Left 08/13/2021   Procedure: ARTERIOVENOUS (AV) FISTULA CREATION (BRACHIALCEPHALIC);  Surgeon: Algernon Huxley, MD;  Location: [name];  Service: Vascular;  Laterality: Left;   COLONOSCOPY WITH PROPOFOL N/A 10/28/2015   Procedure: COLONOSCOPY WITH PROPOFOL;  Surgeon: Lollie Sails, MD;  [facility];  Service: Endoscopy;  Laterality: N/A;   COLONOSCOPY WITH PROPOFOL N/A 10/29/2015   Procedure: COLONOSCOPY WITH PROPOFOL;  Surgeon: Lollie Sails, MD;  Location: [facility];  Service: Endoscopy;  Laterality: N/A;   CORONARY/GRAFT ACUTE MI REVASCULARIZATION N/A 02/26/2020   Procedure: Coronary/Graft Acute MI Revascularization;  Surgeon: Jettie Booze, MD;  Location: [place] [facility];  Service: Cardiovascular;  Laterality: N/A;    DIALYSIS/PERMA CATHETER INSERTION Right 04/26/2019   Perm Cath    DIALYSIS/PERMA CATHETER INSERTION N/A 04/26/2019   Procedure: DIALYSIS/PERMA CATHETER INSERTION;  Surgeon: Algernon Huxley, MD;  Location: [place] [facility];  Service: Cardiovascular;  Laterality: N/A;   DIALYSIS/PERMA CATHETER INSERTION N/A 05/25/2021   Procedure: DIALYSIS/PERMA CATHETER INSERTION;  Surgeon: Algernon Huxley, MD;  Location: [place] [facility];  Service: Cardiovascular;  Laterality: N/A;   DIALYSIS/PERMA CATHETER INSERTION N/A 08/31/2021   Procedure: DIALYSIS/PERMA CATHETER INSERTION;  Surgeon: Algernon Huxley, MD;  Location: [place] [facility];  Service: Cardiovascular;  Laterality: N/A;   DIALYSIS/PERMA CATHETER REMOVAL N/A 08/15/2019   Procedure: DIALYSIS/PERMA CATHETER REMOVAL;  Surgeon: Katha Cabal, MD;  Location: [place] [facility];  Service: Cardiovascular;  Laterality: N/A;   EMBOLIZATION Right 02/06/2021   Procedure: EMBOLIZATION;  Surgeon: Algernon Huxley, MD;  Location: [place] [facility];  Service: Cardiovascular;  Laterality: Right;  Right Upper Extremity Dialysis Access, Permcath Placement   ESOPHAGOGASTRODUODENOSCOPY (EGD) WITH PROPOFOL N/A 12/27/2017   Procedure: ESOPHAGOGASTRODUODENOSCOPY (EGD) WITH PROPOFOL;  Surgeon: [place], Benay Pike, MD;  Location: [name] ENDOSCOPY;  Service: Gastroenterology;  Laterality: N/A;   EYE SURGERY     INTRAVASCULAR ULTRASOUND/IVUS N/A 02/26/2020   Procedure: Intravascular Ultrasound/IVUS;  Surgeon: Jettie Booze, MD;  Location: [place] [facility];  Service: Cardiovascular;  Laterality: N/A;   LEFT HEART CATH AND CORONARY ANGIOGRAPHY N/A 02/26/2020   Procedure: LEFT HEART CATH AND CORONARY ANGIOGRAPHY;  Surgeon: Jettie Booze, MD;  Location: [place] [facility];  Service: Cardiovascular;  Laterality: N/A;   PROSTATE SURGERY  2016   TONSILECTOMY/ADENOIDECTOMY WITH MYRINGOTOMY     TONSILLECTOMY     UPPER EXTREMITY ANGIOGRAPHY Right 11/26/2020    Procedure: Upper Extremity Angiography;  Surgeon: Algernon Huxley, MD;  Location: [place] [facility];  Service: Cardiovascular;  Laterality: Right;   UPPER EXTREMITY  ANGIOGRAPHY Right 02/05/2021   Procedure: UPPER EXTREMITY ANGIOGRAPHY;  Surgeon: Algernon Huxley, MD;  Location: [place] [facility];  Service: Cardiovascular;  Laterality: Right;    Family History  Problem Relation Age of Onset   CAD Father    Stroke Father    Diabetes Mellitus II Mother    Kidney failure Mother    Schizophrenia Mother     Allergies  Allergen Reactions   Promethazine Diarrhea and Other (See Comments)    Muscle cramps   Promethazine Hcl Other (See Comments)    Cramping all over  Other reaction(s): cramp all over   Other     Other reaction(s): Unknown Other reaction(s): Unknown   Malt Other (See Comments)    Cramping       Latest Ref Rng & Units 01/09/2022    6:12 PM 12/04/2021   11:12 AM 12/03/2021    7:49 AM  CBC  WBC 4.0 - 10.5 K/uL 5.2  6.5  4.3   Hemoglobin 13.0 - 17.0 g/dL 10.4  9.5  9.0   Hematocrit 39.0 - 52.0 % 35.2  31.2  29.4   Platelets 150 - 400 K/uL 123  218  244       CMP     Component Value Date/Time   NA 137 01/09/2022 1812   K 3.6 01/09/2022 1812   CL 106 01/09/2022 1812   CO2 23 01/09/2022 1812   GLUCOSE 130 (H) 01/09/2022 1812   BUN 28 (H) 01/09/2022 1812   CREATININE 6.28 (H) 01/09/2022 1812   CALCIUM 7.9 (L) 01/09/2022 1812   PROT 5.8 (L) 12/04/2021 1112   ALBUMIN 2.5 (L) 12/04/2021 1112   AST 10 (L) 12/04/2021 1112   ALT 6 12/04/2021 1112   ALKPHOS 78 12/04/2021 1112   BILITOT 0.3 12/04/2021 1112   GFRNONAA 10 (L) 01/09/2022 1812   GFRAA 5 (L) 08/27/2019 1540     No results found.     Assessment & Plan:   1. ESRD on hemodialysis [facility]) Recommend:  The patient is experiencing increasing problems with their dialysis access.  Patient should have a fistulagram with the intention for intervention.  The intention for intervention is to restore  appropriate flow and prevent thrombosis and possible loss of the access.  As well as improve the quality of dialysis therapy.  The risks, benefits and alternative therapies were reviewed in detail with the patient.  All questions were answered.  The patient agrees to proceed with angio/intervention.    The patient will follow up with me in the office after the procedure.   2. Essential hypertension, benign Continue antihypertensive medications as already ordered, these medications have been reviewed and there are no changes at this time.  3. Uncontrolled type 2 diabetes mellitus with hyperglycemia, with long-term current use of insulin (HCC) Continue hypoglycemic medications as already ordered, these medications have been reviewed and there are no changes at this time.  Hgb A1C to be monitored as already arranged by primary service   Current Outpatient Medications on File Prior to Visit  Medication Sig Dispense Refill   acetaminophen (TYLENOL) 325 MG tablet Take 1-2 tablets (325-650 mg total) by mouth every 4 (four) hours as needed for mild pain.     amiodarone (PACERONE) 200 MG tablet Take 1 tablet (200 mg total) by mouth every morning. 90 tablet 3   amLODipine (NORVASC) 10 MG tablet Take 1 tablet (10 mg total) by mouth daily. 30 tablet 2   atorvastatin (LIPITOR) 80 MG tablet  Take 1 tablet (80 mg total) by mouth daily. (Patient taking differently: Take 80 mg by mouth at bedtime.) 30 tablet 0   calcium acetate (PHOSLO) 667 MG capsule Take 2 capsules (1,334 mg total) by mouth 3 (three) times daily with meals. 180 capsule 0   carvedilol (COREG) 25 MG tablet Take 25 mg by mouth 2 (two) times daily.     cholestyramine (QUESTRAN) 4 GM/DOSE powder Take 4 g by mouth 2 (two) times daily.     cloNIDine (CATAPRES) 0.3 MG tablet Take 1 tablet (0.3 mg total) by mouth every 8 (eight) hours. (Patient taking differently: Take 0.3 mg by mouth every 8 (eight) hours. Only if BP over 200) 90 tablet 2    dorzolamide-timolol (COSOPT) 22.3-6.8 MG/ML ophthalmic solution Place 1 drop into the right eye 2 (two) times daily.     hydrocortisone (ANUSOL-HC) 2.5 % rectal cream Place 1 Application rectally 2 (two) times daily.     irbesartan (AVAPRO) 300 MG tablet Take 1 tablet (300 mg total) by mouth daily. 30 tablet 2   levETIRAcetam (KEPPRA) 750 MG tablet Take 1 tablet (750 mg total) by mouth 2 (two) times daily. (Patient taking differently: Take 750 mg by mouth 2 (two) times daily. Twice daily Only on dialysis days and once on all other days) 60 tablet 2   lidocaine-prilocaine (EMLA) cream Apply topically.     ondansetron (ZOFRAN-ODT) 4 MG disintegrating tablet Take 4 mg by mouth every 12 (twelve) hours as needed for vomiting or nausea.     acarbose (PRECOSE) 50 MG tablet Take 50 mg by mouth 3 (three) times daily. (Patient not taking: Reported on 01/09/2022)     hydrocortisone cream 1 %  (Patient not taking: Reported on 01/09/2022)     levETIRAcetam (KEPPRA) 250 MG tablet Take 1 tablet (250 mg total) by mouth Every Tuesday,Thursday,and Saturday with dialysis. (Patient not taking: Reported on 01/09/2022) 15 tablet 1   nitroGLYCERIN (NITROSTAT) 0.4 MG SL tablet Place 1 tablet (0.4 mg total) under the tongue every 5 (five) minutes as needed for chest pain.     tamsulosin (FLOMAX) 0.4 MG CAPS capsule Take 0.4 mg by mouth daily. (Patient not taking: Reported on 01/09/2022)     No current facility-administered medications on file prior to visit.    There are no Patient Instructions on file for this visit. No follow-ups on file.   Kris Hartmann, NP

## 2022-01-21 ENCOUNTER — Encounter: Admission: RE | Payer: Self-pay | Source: Home / Self Care

## 2022-01-21 ENCOUNTER — Ambulatory Visit: Admission: RE | Admit: 2022-01-21 | Payer: Medicare HMO | Source: Home / Self Care | Admitting: Vascular Surgery

## 2022-01-21 ENCOUNTER — Telehealth (INDEPENDENT_AMBULATORY_CARE_PROVIDER_SITE_OTHER): Payer: Self-pay

## 2022-01-21 DIAGNOSIS — N186 End stage renal disease: Secondary | ICD-10-CM

## 2022-01-21 SURGERY — A/V FISTULAGRAM
Anesthesia: Moderate Sedation | Laterality: Left

## 2022-01-21 MED ORDER — SODIUM CHLORIDE 0.9 % IV SOLN: INTRAVENOUS | Status: DC

## 2022-01-21 MED ORDER — FAMOTIDINE 20 MG PO TABS: 40.0000 mg | ORAL_TABLET | Freq: Once | ORAL | Status: DC | PRN

## 2022-01-21 MED ORDER — CEFAZOLIN SODIUM-DEXTROSE 1-4 GM/50ML-% IV SOLN: 1.0000 g | INTRAVENOUS | Status: DC

## 2022-01-21 MED ORDER — METHYLPREDNISOLONE SODIUM SUCC 125 MG IJ SOLR: 125.0000 mg | Freq: Once | INTRAMUSCULAR | Status: DC | PRN

## 2022-01-21 MED ORDER — DIPHENHYDRAMINE HCL 50 MG/ML IJ SOLN: 50.0000 mg | Freq: Once | INTRAMUSCULAR | Status: DC | PRN

## 2022-01-21 MED ORDER — MIDAZOLAM HCL 2 MG/ML PO SYRP: 8.0000 mg | ORAL_SOLUTION | Freq: Once | ORAL | Status: DC | PRN

## 2022-01-21 NOTE — Telephone Encounter (Signed)
Patient called back complaining about I told him to go to [facility] and he went to the place he gets treatment. I advised that he was told to go to [facility] at 8:00 am and that it was documented when I was talking to him. Patient and 2 other people on the phone then stated that his arm was bleeding from the back and draining fluid. I advised that the earliest he could be rescheduled was 01/25/22 and he should go to the ED to be evaluated. Patient stated he was going to go to the appt and hung up.

## 2022-01-21 NOTE — Telephone Encounter (Signed)
I attempted to contact the patient as he was supposed to have his procedure today with Dr. Lucky Cowboy and came to our office instead. A message was left for a return call.

## 2022-01-24 NOTE — Progress Notes (Deleted)
Cardiology Office Note   Date:  01/24/2022   ID:  [name] Cooper., DOB 1970/03/19, MRN [id number]  PCP:  [name], MD    No chief complaint on file.  CAD  Wt Readings from Last 3 Encounters:  01/12/22 175 lb 4.3 oz (79.5 kg)  01/09/22 175 lb (79.4 kg)  12/05/21 176 lb 12.9 oz (80.2 kg)       History of Present Illness: [name] Dominique Calvey. is a 51 y.o. male  has a hx of uncontrolled DM, ESRD on HD (waiting organ transplant), HTN, HLD, gastroparesis, and diastolic dysfunction who was seen by our team after transfer from [facility] with code STEMI.    He underwent an echo 06/2017 that showed an LVEF of 75%, grade 1 DD and mild MR. He has been recently followed at [facility] for organ transplant (nephrology). Prior to his admission, he was at HD when he developed a sudden episode of hypotension with SOB in 60s and tachycardia at 120s. Per reports, he had completed > 3 hours of dialysis.There was no improvement in BP despite 900cc of fluids at dialysis center. On EMS arrival, EKG showed STE in anterior leads with reciprocal change in inferior lateral leads therefore a code STEMI activated. He was given ASA and heparin bolus and was transfered to [facility] for further evaluation.    Left heart cath in Jan 2022 showed: "The left ventricular systolic function is normal. LV end diastolic pressure is normal. The left ventricular ejection fraction is greater than 65% by visual estimate. There is no aortic valve stenosis. Mid LAD lesion is 90% stenosed. A drug-eluting stent was successfully placed using a STENT RESOLUTE ONYX 3.5X30, postdilated to > 4 mm. Stent optimized with IVUS. Post intervention, there is a 0% residual stenosis.   I suspect his abnormal ECG and anterior injury pattern was more from demand ischemia in the setting of hypotension and AFib with RVR, with severe mid LAD lesion.     During the cath, he converted to NSR briefly and his pulse pressure and BP returned to normal without  any pressors. In addition, LVEDP only 11 mm Hg despite having 2.4 L NS administered on transport.  He may have been a little dehydrated after 3 L fluid removal after dialysis.    In regards to AFib.  Continue IV Amiodarone.  Will have to decide if he needs longterm anticoagulation along with antiplatelet therapy.  If so, would change Brilinta to clopidogrel.  Would have to check what options work in the setting of ESRD."   Changed to Plavix in 2/22 due to cost.    He had eye surgery in 3/22.    Past Medical History:  Diagnosis Date   Anxiety    a.) on BZO (clonazepam) PRN   Aortic dilatation ([place]) 11/25/2020   a.) TTE 11/25/2020: Ao root measured 38 mm. b.) TTE 04/22/2021: Ao root measured 44 mm.   Atrial fibrillation ([place])    a.) CHA2DS2-VASc = 6 (CHF, HTN, CVA x2, prior MI, T2DM). b.) rate/rhythm maintained on oral amiodarone + carvedilol; chronically anticoagulated using dose reduced apixaban + clopidogrel   BPH (benign prostatic hyperplasia)    Cardiac arrest ([place]) 07/26/2021   a.) in setting of hyperkalemia, NSTEMI, and seizure following missed HD; pulseless and apneic --> required 1 round of CPR prior to ROSC   Cellulitis    CHF (congestive heart failure) ([place]) 07/19/2017   a.) TTE 07/19/2017: EF 75%; sev LVH; mild MR/PR/TR; G1DD. b.) TTE 11/25/2020: EF  55-60%; mod LVH; GLS -13.0; mild-mod BAE; triv MR; mild dilated PA; G2DD. c.) TTE 04/22/2021: EF 55-60%; sev LVH; sev LAE, triv MR; G1DD.   Chronic diarrhea    Chronic pain of both ankles    Coronary artery disease 02/26/2020   a.) anterior STEMI 02/26/2020 --> LHC/PCI --> EF 65%; 90% mLAD (3.5 x 30 mm Resolyte Onyx DES x 1). b.) NSTEMI 07/26/2021   Diabetes mellitus, type II ([place])    Diabetic neuropathy (HCC)    Esophagitis    ESRD (end stage renal disease) on dialysis ([place])    a.) [facility]; T-Th-Sat   Frequent falls    Gait instability    Gastritis    Gastroparesis    GERD (gastroesophageal reflux disease)    H/O  enucleation of left eyeball    Heart palpitations    History of kidney stones    HLD (hyperlipidemia)    Hypertension    Insomnia    a.) uses melatonin PRN   Long term current use of anticoagulant    a.) dose reduced apixaban   Long term current use of antithrombotics/antiplatelets    a.) clopidogrel   Nausea and vomiting in adult    recurrent   NSTEMI (non-ST elevated myocardial infarction) ([place]) 07/26/2021   a.) hyperkalemic from missed HD; seizures; decompensated to cardiac arrest and required CPR x 1 round prior to ROSC.   Seizure ([place])    a.) last 07/26/2021 in setting of missed HD --> hyperkalemic at 6.5 --> pulseless/apneic and required CPR; discharged home on levetiracetam.   ST elevation myocardial infarction (STEMI) of anterior wall ([place]) 02/26/2020   a.) LHC/PCI 02/26/2020 --> EF 65%; LVEDP 11 mmHg; 90% mLAD (3.5 x 20 mm Resolute Onyx DES x 1)   Stroke ([place]) 04/2021   a.) documented as x 3 between 04/2021 and 06/2021    Past Surgical History:  Procedure Laterality Date   A/V FISTULAGRAM Left 08/31/2021   Procedure: A/V Fistulagram;  Surgeon: Algernon Huxley, MD;  Location: [facility];  Service: Cardiovascular;  Laterality: Left;   AMPUTATION Left 03/16/2016   Procedure: AMPUTATION DIGIT LEFT HALLUX;  Surgeon: Trula Slade, DPM;  Location: [place];  Service: Podiatry;  Laterality: Left;  can start around 5    AMPUTATION Right 02/09/2021   Procedure: AMPUTATION DIGIT;  Surgeon: Sherilyn Cooter, MD;  Location: [place];  Service: Orthopedics;  Laterality: Right;   ARTHROSCOPIC REPAIR ACL Left    AV FISTULA PLACEMENT Right 05/31/2019   Procedure: Brachiocephalic AV fistula creation;  Surgeon: Algernon Huxley, MD;  Location: [name];  Service: Vascular;  Laterality: Right;   AV FISTULA PLACEMENT Left 08/13/2021   Procedure: ARTERIOVENOUS (AV) FISTULA CREATION (BRACHIALCEPHALIC);  Surgeon: Algernon Huxley, MD;  Location: [name];  Service: Vascular;  Laterality: Left;    COLONOSCOPY WITH PROPOFOL N/A 10/28/2015   Procedure: COLONOSCOPY WITH PROPOFOL;  Surgeon: Lollie Sails, MD;  Location: [facility];  Service: Endoscopy;  Laterality: N/A;   COLONOSCOPY WITH PROPOFOL N/A 10/29/2015   Procedure: COLONOSCOPY WITH PROPOFOL;  Surgeon: Lollie Sails, MD;  Location: [facility] ENDOSCOPY;  Service: Endoscopy;  Laterality: N/A;   CORONARY/GRAFT ACUTE MI REVASCULARIZATION N/A 02/26/2020   Procedure: Coronary/Graft Acute MI Revascularization;  Surgeon: Jettie Booze, MD;  Location: [place] [facility];  Service: Cardiovascular;  Laterality: N/A;   DIALYSIS/PERMA CATHETER INSERTION Right 04/26/2019   Perm Cath    DIALYSIS/PERMA CATHETER INSERTION N/A 04/26/2019   Procedure: DIALYSIS/PERMA CATHETER INSERTION;  Surgeon:  Algernon Huxley, MD;  Location: [place] [facility];  Service: Cardiovascular;  Laterality: N/A;   DIALYSIS/PERMA CATHETER INSERTION N/A 05/25/2021   Procedure: DIALYSIS/PERMA CATHETER INSERTION;  Surgeon: Algernon Huxley, MD;  Location: [place] [facility];  Service: Cardiovascular;  Laterality: N/A;   DIALYSIS/PERMA CATHETER INSERTION N/A 08/31/2021   Procedure: DIALYSIS/PERMA CATHETER INSERTION;  Surgeon: Algernon Huxley, MD;  Location: [place] [facility];  Service: Cardiovascular;  Laterality: N/A;   DIALYSIS/PERMA CATHETER REMOVAL N/A 08/15/2019   Procedure: DIALYSIS/PERMA CATHETER REMOVAL;  Surgeon: Katha Cabal, MD;  Location: [place] [facility];  Service: Cardiovascular;  Laterality: N/A;   EMBOLIZATION Right 02/06/2021   Procedure: EMBOLIZATION;  Surgeon: Algernon Huxley, MD;  Location: [place] [facility];  Service: Cardiovascular;  Laterality: Right;  Right Upper Extremity Dialysis Access, Permcath Placement   ESOPHAGOGASTRODUODENOSCOPY (EGD) WITH PROPOFOL N/A 12/27/2017   Procedure: ESOPHAGOGASTRODUODENOSCOPY (EGD) WITH PROPOFOL;  Surgeon: [place], Benay Pike, MD;  Location: [name] ENDOSCOPY;  Service: Gastroenterology;  Laterality: N/A;    EYE SURGERY     INTRAVASCULAR ULTRASOUND/IVUS N/A 02/26/2020   Procedure: Intravascular Ultrasound/IVUS;  Surgeon: Jettie Booze, MD;  Location: [place] [facility];  Service: Cardiovascular;  Laterality: N/A;   LEFT HEART CATH AND CORONARY ANGIOGRAPHY N/A 02/26/2020   Procedure: LEFT HEART CATH AND CORONARY ANGIOGRAPHY;  Surgeon: Jettie Booze, MD;  Location: [place] [facility];  Service: Cardiovascular;  Laterality: N/A;   PROSTATE SURGERY  2016   TONSILECTOMY/ADENOIDECTOMY WITH MYRINGOTOMY     TONSILLECTOMY     UPPER EXTREMITY ANGIOGRAPHY Right 11/26/2020   Procedure: Upper Extremity Angiography;  Surgeon: Algernon Huxley, MD;  Location: [place] [facility];  Service: Cardiovascular;  Laterality: Right;   UPPER EXTREMITY ANGIOGRAPHY Right 02/05/2021   Procedure: UPPER EXTREMITY ANGIOGRAPHY;  Surgeon: Algernon Huxley, MD;  Location: [place] [facility];  Service: Cardiovascular;  Laterality: Right;     Current Outpatient Medications  Medication Sig Dispense Refill   oxyCODONE-acetaminophen (PERCOCET/ROXICET) 5-325 MG tablet Take 1 tablet by mouth every 4 (four) hours as needed for severe pain. 20 tablet 0   acarbose (PRECOSE) 50 MG tablet Take 50 mg by mouth 3 (three) times daily. (Patient not taking: Reported on 01/09/2022)     acetaminophen (TYLENOL) 325 MG tablet Take 1-2 tablets (325-650 mg total) by mouth every 4 (four) hours as needed for mild pain.     amiodarone (PACERONE) 200 MG tablet Take 1 tablet (200 mg total) by mouth every morning. 90 tablet 3   amLODipine (NORVASC) 10 MG tablet Take 1 tablet (10 mg total) by mouth daily. 30 tablet 2   atorvastatin (LIPITOR) 80 MG tablet Take 1 tablet (80 mg total) by mouth daily. (Patient taking differently: Take 80 mg by mouth at bedtime.) 30 tablet 0   calcium acetate (PHOSLO) 667 MG capsule Take 2 capsules (1,334 mg total) by mouth 3 (three) times daily with meals. 180 capsule 0   carvedilol (COREG) 25 MG tablet Take 25 mg by  mouth 2 (two) times daily.     cholestyramine (QUESTRAN) 4 GM/DOSE powder Take 4 g by mouth 2 (two) times daily.     cloNIDine (CATAPRES) 0.3 MG tablet Take 1 tablet (0.3 mg total) by mouth every 8 (eight) hours. (Patient taking differently: Take 0.3 mg by mouth every 8 (eight) hours. Only if BP over 200) 90 tablet 2   dorzolamide-timolol (COSOPT) 22.3-6.8 MG/ML ophthalmic solution Place 1 drop into the right eye 2 (two)  times daily.     hydrocortisone (ANUSOL-HC) 2.5 % rectal cream Place 1 Application rectally 2 (two) times daily.     hydrocortisone cream 1 %  (Patient not taking: Reported on 01/09/2022)     irbesartan (AVAPRO) 300 MG tablet Take 1 tablet (300 mg total) by mouth daily. 30 tablet 2   levETIRAcetam (KEPPRA) 250 MG tablet Take 1 tablet (250 mg total) by mouth Every Tuesday,Thursday,and Saturday with dialysis. (Patient not taking: Reported on 01/09/2022) 15 tablet 1   levETIRAcetam (KEPPRA) 750 MG tablet Take 1 tablet (750 mg total) by mouth 2 (two) times daily. (Patient taking differently: Take 750 mg by mouth 2 (two) times daily. Twice daily Only on dialysis days and once on all other days) 60 tablet 2   lidocaine-prilocaine (EMLA) cream Apply topically.     nitroGLYCERIN (NITROSTAT) 0.4 MG SL tablet Place 1 tablet (0.4 mg total) under the tongue every 5 (five) minutes as needed for chest pain.     ondansetron (ZOFRAN-ODT) 4 MG disintegrating tablet Take 4 mg by mouth every 12 (twelve) hours as needed for vomiting or nausea.     tamsulosin (FLOMAX) 0.4 MG CAPS capsule Take 0.4 mg by mouth daily. (Patient not taking: Reported on 01/09/2022)     No current facility-administered medications for this visit.    Allergies:   Promethazine, Promethazine hcl, Other, and Malt    Social History:  The patient  reports that he has never smoked. He has never used smokeless tobacco. He reports that he does not drink alcohol and does not use drugs.   Family History:  The patient's ***family  history includes CAD in his father; Diabetes Mellitus II in his mother; Kidney failure in his mother; Schizophrenia in his mother; Stroke in his father.    ROS:  Please see the history of present illness.   Otherwise, review of systems are positive for ***.   All other systems are reviewed and negative.    PHYSICAL EXAM: VS:  There were no vitals taken for this visit. , BMI There is no height or weight on file to calculate BMI. GEN: Well nourished, well developed, in no acute distress HEENT: normal Neck: no JVD, carotid bruits, or masses Cardiac: ***RRR; no murmurs, rubs, or gallops,no edema  Respiratory:  clear to auscultation bilaterally, normal work of breathing GI: soft, nontender, nondistended, + BS MS: no deformity or atrophy Skin: warm and dry, no rash Neuro:  Strength and sensation are intact Psych: euthymic mood, full affect   EKG:   The ekg ordered today demonstrates ***   Recent Labs: 04/22/2021: TSH 3.772 11/21/2021: B Natriuretic Peptide 1,416.0; Magnesium 1.9 12/04/2021: ALT 6 01/09/2022: BUN 28; Creatinine, Ser 6.28; Hemoglobin 10.4; Platelets 123; Potassium 3.6; Sodium 137   Lipid Panel    Component Value Date/Time   CHOL 94 11/21/2021 1117   TRIG 124 11/26/2021 0327   HDL 39 (L) 11/21/2021 1117   CHOLHDL 2.4 11/21/2021 1117   VLDL 21 11/21/2021 1117   LDLCALC 34 11/21/2021 1117   LDLCALC 102 (H) 04/25/2019 1159     Other studies Reviewed: Additional studies/ records that were reviewed today with results demonstrating: ***.   ASSESSMENT AND PLAN:  CAD/old MI: ESRD: PAF: Hypertension: Hyperlipidemia:   Current medicines are reviewed at length with the patient today.  The patient concerns regarding his medicines were addressed.  The following changes have been made:  No change***  Labs/ tests ordered today include: *** No orders of the defined types were  placed in this encounter.   Recommend 150 minutes/week of aerobic exercise Low fat, low  carb, high fiber diet recommended  Disposition:   FU in ***   Signed, Larae Grooms, MD  01/24/2022 4:47 PM    [facility] [address], [place], [place]  [address] Phone: [phone]; Fax: [phone]

## 2022-01-25 ENCOUNTER — Ambulatory Visit: Payer: Medicare HMO | Admitting: Interventional Cardiology

## 2022-01-25 ENCOUNTER — Other Ambulatory Visit: Payer: Self-pay

## 2022-01-25 ENCOUNTER — Encounter: Payer: Self-pay | Admitting: Vascular Surgery

## 2022-01-25 ENCOUNTER — Encounter
Admission: RE | Disposition: A | Discharge status: Home / Self Care | Payer: Self-pay | Source: Home / Self Care | Attending: Vascular Surgery

## 2022-01-25 ENCOUNTER — Ambulatory Visit
Admission: RE | Admit: 2022-01-25 | Discharge: 2022-01-25 | Disposition: A | Discharge status: Home / Self Care | Payer: Medicare HMO | Attending: Vascular Surgery | Admitting: Vascular Surgery

## 2022-01-25 DIAGNOSIS — Z992 Dependence on renal dialysis: Secondary | ICD-10-CM | POA: Diagnosis not present

## 2022-01-25 DIAGNOSIS — I480 Paroxysmal atrial fibrillation: Secondary | ICD-10-CM

## 2022-01-25 DIAGNOSIS — N186 End stage renal disease: Secondary | ICD-10-CM

## 2022-01-25 DIAGNOSIS — I120 Hypertensive chronic kidney disease with stage 5 chronic kidney disease or end stage renal disease: Secondary | ICD-10-CM | POA: Diagnosis not present

## 2022-01-25 DIAGNOSIS — I252 Old myocardial infarction: Secondary | ICD-10-CM

## 2022-01-25 DIAGNOSIS — T82858A Stenosis of vascular prosthetic devices, implants and grafts, initial encounter: Secondary | ICD-10-CM | POA: Insufficient documentation

## 2022-01-25 DIAGNOSIS — I25118 Atherosclerotic heart disease of native coronary artery with other forms of angina pectoris: Secondary | ICD-10-CM

## 2022-01-25 DIAGNOSIS — E1165 Type 2 diabetes mellitus with hyperglycemia: Secondary | ICD-10-CM | POA: Diagnosis not present

## 2022-01-25 DIAGNOSIS — E1122 Type 2 diabetes mellitus with diabetic chronic kidney disease: Secondary | ICD-10-CM | POA: Diagnosis not present

## 2022-01-25 DIAGNOSIS — Y841 Kidney dialysis as the cause of abnormal reaction of the patient, or of later complication, without mention of misadventure at the time of the procedure: Secondary | ICD-10-CM | POA: Insufficient documentation

## 2022-01-25 DIAGNOSIS — I10 Essential (primary) hypertension: Secondary | ICD-10-CM

## 2022-01-25 DIAGNOSIS — E785 Hyperlipidemia, unspecified: Secondary | ICD-10-CM

## 2022-01-25 HISTORY — PX: A/V FISTULAGRAM: CATH118298

## 2022-01-25 LAB — POTASSIUM (ARMC VASCULAR LAB ONLY): Potassium (ARMC vascular lab): 5.5 mmol/L — ABNORMAL HIGH (ref 3.5–5.1)

## 2022-01-25 SURGERY — A/V FISTULAGRAM
Anesthesia: Moderate Sedation | Laterality: Left

## 2022-01-25 MED ORDER — SODIUM CHLORIDE 0.9 % IV SOLN: INTRAVENOUS | Status: DC

## 2022-01-25 MED ORDER — HEPARIN SODIUM (PORCINE) 1000 UNIT/ML IJ SOLN
INTRAMUSCULAR | Status: DC | PRN
  Administered 2022-01-25: 4000 [IU] via INTRAVENOUS

## 2022-01-25 MED ORDER — CEFAZOLIN SODIUM-DEXTROSE 1-4 GM/50ML-% IV SOLN
1.0000 g | Freq: Once | INTRAVENOUS | Status: AC
  Administered 2022-01-25: 1 g via INTRAVENOUS

## 2022-01-25 MED ORDER — HYDROMORPHONE HCL 1 MG/ML IJ SOLN: 1.0000 mg | Freq: Once | INTRAMUSCULAR | Status: DC | PRN

## 2022-01-25 MED ORDER — FENTANYL CITRATE PF 50 MCG/ML IJ SOSY
PREFILLED_SYRINGE | INTRAMUSCULAR | Status: AC
  Filled 2022-01-25: qty 1

## 2022-01-25 MED ORDER — MIDAZOLAM HCL 2 MG/2ML IJ SOLN
INTRAMUSCULAR | Status: DC | PRN
  Administered 2022-01-25: 2 mg via INTRAVENOUS

## 2022-01-25 MED ORDER — IODIXANOL 320 MG/ML IV SOLN
INTRAVENOUS | Status: DC | PRN
  Administered 2022-01-25: 30 mL

## 2022-01-25 MED ORDER — CEFAZOLIN SODIUM-DEXTROSE 1-4 GM/50ML-% IV SOLN
INTRAVENOUS | Status: AC
  Filled 2022-01-25: qty 50

## 2022-01-25 MED ORDER — FENTANYL CITRATE (PF) 100 MCG/2ML IJ SOLN
INTRAMUSCULAR | Status: DC | PRN
  Administered 2022-01-25: 50 ug via INTRAVENOUS

## 2022-01-25 MED ORDER — HEPARIN SODIUM (PORCINE) 1000 UNIT/ML IJ SOLN
INTRAMUSCULAR | Status: AC
  Filled 2022-01-25: qty 10

## 2022-01-25 MED ORDER — MIDAZOLAM HCL 2 MG/2ML IJ SOLN
INTRAMUSCULAR | Status: AC
  Filled 2022-01-25: qty 2

## 2022-01-25 SURGICAL SUPPLY — 13 items
BALLN LUTONIX 7X80X130 (BALLOONS) ×1
BALLOON LUTONIX 7X80X130 (BALLOONS) IMPLANT
CANNULA 5F STIFF (CANNULA) IMPLANT
CATH BEACON 5 .035 40 KMP TP (CATHETERS) IMPLANT
CATH BEACON 5 .038 40 KMP TP (CATHETERS)
CATH KUMPE SOFT-VU 5FR 65 (CATHETERS) IMPLANT
COVER PROBE ULTRASOUND 5X96 (MISCELLANEOUS) IMPLANT
DRAPE BRACHIAL (DRAPES) IMPLANT
KIT ENCORE 26 ADVANTAGE (KITS) IMPLANT
PACK ANGIOGRAPHY (CUSTOM PROCEDURE TRAY) ×1 IMPLANT
SHEATH BRITE TIP 6FRX5.5 (SHEATH) IMPLANT
SUT MNCRL AB 4-0 PS2 18 (SUTURE) IMPLANT
WIRE SUPRACORE 190CM (MISCELLANEOUS) IMPLANT

## 2022-01-25 NOTE — Interval H&P Note (Signed)
History and Physical Interval Note:  01/25/2022 10:12 AM  [name].  has presented today for surgery, with the diagnosis of LUE Fistulagram   End Stage Renal.  The various methods of treatment have been discussed with the patient and family. After consideration of risks, benefits and other options for treatment, the patient has consented to  Procedure(s): A/V Fistulagram (Left) as a surgical intervention.  The patient's history has been reviewed, patient examined, no change in status, stable for surgery.  I have reviewed the patient's chart and labs.  Questions were answered to the patient's satisfaction.     [name]

## 2022-01-25 NOTE — Op Note (Signed)
[facility]    OPERATIVE NOTE   PROCEDURE: 1.   Left brachiocephalic arteriovenous fistula cannulation under ultrasound guidance 2.   Left arm fistulagram including central venogram 3.   Percutaneous transluminal angioplasty of mid to distal upper arm cephalic vein with 7 mm diameter by 10 cm length Lutonix drug-coated angioplasty balloon  PRE-OPERATIVE DIAGNOSIS: 1. ESRD 2. Poorly functional left brachiocephalic AVF  POST-OPERATIVE DIAGNOSIS: same as above   SURGEON: [name], MD  ANESTHESIA: local with MCS  ESTIMATED BLOOD LOSS: 3 cc  FINDING(S): High-grade stenosis in the mid to distal upper arm cephalic vein in the 54% range.  Some degree of narrowing in the perianastomotic area but difficult to discern as it was very close proximity to her access site.  The cephalic vein subclavian vein confluence and the central venous flow was normal without significant stenosis.  SPECIMEN(S):  None  CONTRAST: 30 cc  FLUORO TIME: 3 minutes  MODERATE CONSCIOUS SEDATION TIME: Approximately 25 minutes with 2 mg of Versed and 50 mcg of Fentanyl   INDICATIONS: [name]. is a 51 y.o. male who presents with malfunctioning left brachiocephalic arteriovenous fistula.  The patient is scheduled for left arm fistulagram.  The patient is aware the risks include but are not limited to: bleeding, infection, thrombosis of the cannulated access, and possible anaphylactic reaction to the contrast.  The patient is aware of the risks of the procedure and elects to proceed forward.  DESCRIPTION: After full informed written consent was obtained, the patient was brought back to the angiography suite and placed supine upon the angiography table.  The patient was connected to monitoring equipment. Moderate conscious sedation was administered with a face to face encounter with the patient throughout the procedure with my supervision of the RN administering medicines and  monitoring the patient's vital signs and mental status throughout from the start of the procedure until the patient was taken to the recovery room. The left arm was prepped and draped in the standard fashion for a percutaneous access intervention.  Under ultrasound guidance, the left brachiocephalic arteriovenous fistula was cannulated with a micropuncture needle under direct ultrasound guidance where it was patent in the distal upper arm cephalic vein in an antegrade fashion and a permanent image was performed.  The microwire was advanced into the fistula and the needle was exchanged for the a microsheath.  I then upsized to a 6 Fr Sheath and imaging was performed.  Hand injections were completed to image the access including the central venous system. This demonstrated a high-grade stenosis in the mid to distal upper arm cephalic vein in the 49% range.  Some degree of narrowing in the perianastomotic area but difficult to discern as it was very close proximity to her access site.  The cephalic vein subclavian vein confluence and the central venous flow was normal without significant stenosis.  Based on the images, this patient will need intervention to the stenosis. I then gave the patient 4000 units of intravenous heparin.  I then crossed the stenosis with a supra core wire.  Based on the imaging, a 7 mm x 10 cm Lutonix drug-coated angioplasty balloon was selected.  The balloon was centered around the mid to distal upper arm cephalic vein stenosis and inflated to 14 ATM for 1 minute(s).  On completion imaging, a 20-25% residual stenosis was present.  I did not treat the possible stenosis in the perianastomotic cephalic vein due to its close proximity to our access site  and the requirement of a retrograde access in the area where we just angioplastied, this did not appear as high-grade as the area treated.  If he continues to have symptoms, we can come back and address this from a retrograde approach.   Based  on the completion imaging, no further intervention is necessary.  The wire and balloon were removed from the sheath.  A 4-0 Monocryl purse-string suture was sewn around the sheath.  The sheath was removed while tying down the suture.  A sterile bandage was applied to the puncture site.  COMPLICATIONS: None  CONDITION: Stable   [name]  01/25/2022 1:00 PM   This note was created with Dragon Medical transcription system. Any errors in dictation are purely unintentional.

## 2022-01-26 ENCOUNTER — Encounter: Payer: Self-pay | Admitting: Vascular Surgery

## 2022-01-30 ENCOUNTER — Other Ambulatory Visit: Payer: Self-pay

## 2022-01-30 ENCOUNTER — Encounter (HOSPITAL_COMMUNITY): Payer: Self-pay

## 2022-01-30 ENCOUNTER — Emergency Department (HOSPITAL_COMMUNITY)
Admission: EM | Admit: 2022-01-30 | Discharge: 2022-01-30 | Discharge status: Against Medical Advice | Payer: Medicare HMO | Attending: Emergency Medicine | Admitting: Emergency Medicine

## 2022-01-30 ENCOUNTER — Emergency Department (HOSPITAL_COMMUNITY): Payer: Medicare HMO

## 2022-01-30 DIAGNOSIS — Z79899 Other long term (current) drug therapy: Secondary | ICD-10-CM | POA: Insufficient documentation

## 2022-01-30 DIAGNOSIS — I120 Hypertensive chronic kidney disease with stage 5 chronic kidney disease or end stage renal disease: Secondary | ICD-10-CM | POA: Diagnosis not present

## 2022-01-30 DIAGNOSIS — Z5329 Procedure and treatment not carried out because of patient's decision for other reasons: Secondary | ICD-10-CM | POA: Insufficient documentation

## 2022-01-30 DIAGNOSIS — Y838 Other surgical procedures as the cause of abnormal reaction of the patient, or of later complication, without mention of misadventure at the time of the procedure: Secondary | ICD-10-CM | POA: Diagnosis not present

## 2022-01-30 DIAGNOSIS — E1122 Type 2 diabetes mellitus with diabetic chronic kidney disease: Secondary | ICD-10-CM | POA: Insufficient documentation

## 2022-01-30 DIAGNOSIS — T82590A Other mechanical complication of surgically created arteriovenous fistula, initial encounter: Secondary | ICD-10-CM | POA: Diagnosis present

## 2022-01-30 DIAGNOSIS — Z992 Dependence on renal dialysis: Secondary | ICD-10-CM | POA: Insufficient documentation

## 2022-01-30 DIAGNOSIS — N186 End stage renal disease: Secondary | ICD-10-CM | POA: Insufficient documentation

## 2022-01-30 DIAGNOSIS — T829XXA Unspecified complication of cardiac and vascular prosthetic device, implant and graft, initial encounter: Secondary | ICD-10-CM

## 2022-01-30 LAB — CBC WITH DIFFERENTIAL/PLATELET
Abs Immature Granulocytes: 0.06 10*3/uL (ref 0.00–0.07)
Basophils Absolute: 0 10*3/uL (ref 0.0–0.1)
Basophils Relative: 1 %
Eosinophils Absolute: 0.4 10*3/uL (ref 0.0–0.5)
Eosinophils Relative: 6 %
HCT: 30.9 % — ABNORMAL LOW (ref 39.0–52.0)
Hemoglobin: 9.3 g/dL — ABNORMAL LOW (ref 13.0–17.0)
Immature Granulocytes: 1 %
Lymphocytes Relative: 24 %
Lymphs Abs: 1.5 10*3/uL (ref 0.7–4.0)
MCH: 20.8 pg — ABNORMAL LOW (ref 26.0–34.0)
MCHC: 30.1 g/dL (ref 30.0–36.0)
MCV: 69 fL — ABNORMAL LOW (ref 80.0–100.0)
Monocytes Absolute: 0.8 10*3/uL (ref 0.1–1.0)
Monocytes Relative: 12 %
Neutro Abs: 3.6 10*3/uL (ref 1.7–7.7)
Neutrophils Relative %: 56 %
Platelets: 203 10*3/uL (ref 150–400)
RBC: 4.48 MIL/uL (ref 4.22–5.81)
RDW: 25.3 % — ABNORMAL HIGH (ref 11.5–15.5)
WBC: 6.3 10*3/uL (ref 4.0–10.5)
nRBC: 0.3 % — ABNORMAL HIGH (ref 0.0–0.2)

## 2022-01-30 LAB — BASIC METABOLIC PANEL
Anion gap: 11 (ref 5–15)
BUN: 88 mg/dL — ABNORMAL HIGH (ref 6–20)
CO2: 18 mmol/L — ABNORMAL LOW (ref 22–32)
Calcium: 8.5 mg/dL — ABNORMAL LOW (ref 8.9–10.3)
Chloride: 113 mmol/L — ABNORMAL HIGH (ref 98–111)
Creatinine, Ser: 10.89 mg/dL — ABNORMAL HIGH (ref 0.61–1.24)
GFR, Estimated: 5 mL/min — ABNORMAL LOW (ref >60)
Glucose, Bld: 90 mg/dL (ref 70–99)
Potassium: 5 mmol/L (ref 3.5–5.1)
Sodium: 142 mmol/L (ref 135–145)

## 2022-01-30 NOTE — ED Triage Notes (Signed)
Patient went to receive dialysis this morning and was told to come here due to fistula infection. Patient recently had surgery last Thursday due to his fistula in the left arm not working properly. Patient now has warmth, swelling extending to left hand and pain.

## 2022-01-30 NOTE — ED Provider Notes (Signed)
[facility] EMERGENCY DEPARTMENT Provider Note   CSN: [id number] Arrival date & time: 01/30/22  8563     History  Chief Complaint  Patient presents with   Vascular Access Problem    [name]. is a 51 y.o. male.  Pt is a 51 yo male with a pmhx significant for ESRD on HD (Tu, Th and Sat), DM, HTN, HLD, DM2.  Pt had a malfunctioning left brachiocephalic AVF due to stenosis of the mid to distal upper arm cephalic vein.  Pt had surgery by Dr. Lucky Cowboy [facility]) on 12/4 who dilated the stenotic area with an angioplasty balloon.  Pt has had pain in his left upper arm since the procedure, but thought that was normal.  Pt presented to dialysis today and they told him to come get evaluated because they were concerned it was infected.  Pt denies f/c.         Home Medications Prior to Admission medications   Medication Sig Start Date End Date Taking? Authorizing Provider  oxyCODONE-acetaminophen (PERCOCET/ROXICET) 5-325 MG tablet Take 1 tablet by mouth every 4 (four) hours as needed for severe pain. 01/20/22   Kris Hartmann, NP  acarbose (PRECOSE) 50 MG tablet Take 50 mg by mouth 3 (three) times daily. 10/20/21   [provider]  acetaminophen (TYLENOL) 325 MG tablet Take 1-2 tablets (325-650 mg total) by mouth every 4 (four) hours as needed for mild pain. 05/04/21   Love, Ivan Anchors, PA-C  amiodarone (PACERONE) 200 MG tablet Take 1 tablet (200 mg total) by mouth every morning. 08/10/21 08/11/22  Swinyer, Lanice Schwab, NP  amLODipine (NORVASC) 10 MG tablet Take 1 tablet (10 mg total) by mouth daily. 12/03/21   Bonnielee Haff, MD  atorvastatin (LIPITOR) 80 MG tablet Take 1 tablet (80 mg total) by mouth daily. Patient not taking: Reported on 01/25/2022 05/04/21 04/29/22  Love, Ivan Anchors, PA-C  calcium acetate (PHOSLO) 667 MG capsule Take 2 capsules (1,334 mg total) by mouth 3 (three) times daily with meals. 05/04/21   Love, Ivan Anchors, PA-C  carvedilol (COREG) 25 MG tablet Take 25  mg by mouth 2 (two) times daily. 06/16/21   [provider]  cholestyramine Lucrezia Starch) 4 GM/DOSE powder Take 4 g by mouth 2 (two) times daily. 10/20/21   [provider]  cloNIDine (CATAPRES) 0.3 MG tablet Take 1 tablet (0.3 mg total) by mouth every 8 (eight) hours. Patient taking differently: Take 0.3 mg by mouth every 8 (eight) hours. Only if BP over 200 12/03/21   Bonnielee Haff, MD  dorzolamide-timolol (COSOPT) 22.3-6.8 MG/ML ophthalmic solution Place 1 drop into the right eye 2 (two) times daily.    [provider]  hydrocortisone (ANUSOL-HC) 2.5 % rectal cream Place 1 Application rectally 2 (two) times daily. 04/06/21   [provider]  hydrocortisone cream 1 %  05/04/21   [provider]  irbesartan (AVAPRO) 300 MG tablet Take 1 tablet (300 mg total) by mouth daily. 12/03/21   Bonnielee Haff, MD  levETIRAcetam (KEPPRA) 250 MG tablet Take 1 tablet (250 mg total) by mouth Every Tuesday,Thursday,and Saturday with dialysis. 07/30/21   Nita Sells, MD  levETIRAcetam (KEPPRA) 750 MG tablet Take 1 tablet (750 mg total) by mouth 2 (two) times daily. Patient taking differently: Take 750 mg by mouth 2 (two) times daily. Twice daily Only on dialysis days and once on all other days 12/05/21   Bonnielee Haff, MD  lidocaine-prilocaine (EMLA) cream Apply topically. 12/24/21   [provider]  nitroGLYCERIN (NITROSTAT) 0.4 MG SL tablet Place 1 tablet (0.4 mg total) under the tongue every 5 (five) minutes as needed for chest pain. 12/09/20 01/09/22  Johnson, Clanford L, MD  ondansetron (ZOFRAN-ODT) 4 MG disintegrating tablet Take 4 mg by mouth every 12 (twelve) hours as needed for vomiting or nausea.    [provider]  tamsulosin (FLOMAX) 0.4 MG CAPS capsule Take 0.4 mg by mouth daily. Patient not taking: Reported on 01/09/2022 05/19/21   [provider]      Allergies    Promethazine, Promethazine hcl, Other, and Malt    Review  of Systems   Review of Systems  Musculoskeletal:        Left upper arm pain  All other systems reviewed and are negative.   Physical Exam Updated Vital Signs BP (!) 148/70 (BP Location: Right Arm)   Pulse (!) 58   Resp 18   Ht 6\' 3"  (1.905 m)   Wt 83 kg   SpO2 96%   BMI 22.87 kg/m  Physical Exam Vitals and nursing note reviewed.  Constitutional:      Appearance: Normal appearance.  HENT:     Head: Normocephalic and atraumatic.     Right Ear: External ear normal.     Left Ear: External ear normal.     Nose: Nose normal.     Mouth/Throat:     Mouth: Mucous membranes are moist.     Pharynx: Oropharynx is clear.  Eyes:     Comments: Pirate's patch over left eye  Cardiovascular:     Rate and Rhythm: Normal rate and regular rhythm.     Pulses: Normal pulses.     Heart sounds: Normal heart sounds.  Pulmonary:     Effort: Pulmonary effort is normal.     Breath sounds: Normal breath sounds.  Chest:     Comments: Dialysis catheter right upper chest Abdominal:     General: Abdomen is flat. Bowel sounds are normal.     Palpations: Abdomen is soft.  Musculoskeletal:        General: Normal range of motion.     Cervical back: Normal range of motion and neck supple.     Comments: Right upper arm nonfunctioning AVF Left AVF in left ac with good thrill.  Left upper arm swollen and tender.  Skin:    General: Skin is warm.     Capillary Refill: Capillary refill takes less than 2 seconds.  Neurological:     General: No focal deficit present.     Mental Status: He is alert and oriented to person, place, and time.  Psychiatric:        Mood and Affect: Mood normal.        Behavior: Behavior normal.     ED Results / Procedures / Treatments   Labs (all labs ordered are listed, but only abnormal results are displayed) Labs Reviewed  BASIC METABOLIC PANEL - Abnormal; Notable for the following components:      Result Value   Chloride 113 (*)    CO2 18 (*)    BUN 88 (*)     Creatinine, Ser 10.89 (*)    Calcium 8.5 (*)    GFR, Estimated 5 (*)    All other components within normal limits  CBC WITH DIFFERENTIAL/PLATELET - Abnormal; Notable for the following components:   Hemoglobin 9.3 (*)    HCT 30.9 (*)    MCV 69.0 (*)    MCH 20.8 (*)  RDW 25.3 (*)    nRBC 0.3 (*)    All other components within normal limits    EKG None  Radiology No results found.  Procedures Procedures    Medications Ordered in ED Medications - No data to display  ED Course/ Medical Decision Making/ A&P                           Medical Decision Making Amount and/or Complexity of Data Reviewed Labs: ordered.   This patient presents to the ED for concern of av fistula malfunction, this involves an extensive number of treatment options, and is a complaint that carries with it a high risk of complications and morbidity.  The differential diagnosis includes av fistula infection, cephalic vein thrombosis   Co morbidities that complicate the patient evaluation  ESRD on HD (Tu, Th and Sat), DM, HTN, HLD, DM2   Additional history obtained:  Additional history obtained from epic chart review    Lab Tests:  I Ordered, and personally interpreted labs.  The pertinent results include:  cbc with hgb 9.3 (chronic); bun 88 and cr 10.89 (chronic); k 5.0   Imaging Studies ordered:  I ordered imaging studies including [place].  Pt left prior to [place].   Cardiac Monitoring:  The patient was maintained on a cardiac monitor.  I personally viewed and interpreted the cardiac monitored which showed an underlying rhythm of: nsr   Medicines ordered and prescription drug management:  I have reviewed the patients home medicines and have made adjustments as needed   Test Considered:  [place]   Consultations Obtained:  I requested consultation with [facility] (Dr. Lorenso Courier),  and discussed lab and imaging findings as well as pertinent plan -she recommended [place]   Problem List / ED  Course:  Left AV fistula problem:  pt told the nurse he did not want to wait any more and signed out AMA.  I did not realize he'd gone until I went to check on him.    Social Determinants of Health:  Lives at home   Dispostion:  Pt left AMA prior to my being able to talk with him. Unfortunately, he did not stay to get his AVF evaluated by [place].  However, Dr. Lorenso Courier did get his name when I spoke with her and had planned to have pt f/u in the office.        Final Clinical Impression(s) / ED Diagnoses Final diagnoses:  ESRD (end stage renal disease) on dialysis ([place])  Complication of AV dialysis fistula, initial encounter    Rx / DC Orders ED Discharge Orders     None         Isla Pence, MD 01/30/22 1234

## 2022-02-01 ENCOUNTER — Telehealth (INDEPENDENT_AMBULATORY_CARE_PROVIDER_SITE_OTHER): Payer: Self-pay

## 2022-02-01 NOTE — Telephone Encounter (Signed)
The patient needs to be seen at f/u before we can send that.  The last time I saw him his arm was so swollen there is no way they could stick it.  It has only been a week so I'm not certain the swelling decreased that much.  He should be seen in the next 2 weeks with HDA

## 2022-02-01 NOTE — Telephone Encounter (Signed)
Patient wants a note sent to his dialysis center stating that they can stick his access. Please advise.

## 2022-02-11 ENCOUNTER — Other Ambulatory Visit (INDEPENDENT_AMBULATORY_CARE_PROVIDER_SITE_OTHER): Payer: Self-pay | Admitting: Vascular Surgery

## 2022-02-11 DIAGNOSIS — Z9862 Peripheral vascular angioplasty status: Secondary | ICD-10-CM

## 2022-02-11 DIAGNOSIS — N186 End stage renal disease: Secondary | ICD-10-CM

## 2022-02-17 ENCOUNTER — Ambulatory Visit (INDEPENDENT_AMBULATORY_CARE_PROVIDER_SITE_OTHER): Payer: Medicare HMO | Admitting: Nurse Practitioner

## 2022-02-17 ENCOUNTER — Encounter (INDEPENDENT_AMBULATORY_CARE_PROVIDER_SITE_OTHER): Payer: Self-pay | Admitting: Nurse Practitioner

## 2022-02-17 ENCOUNTER — Ambulatory Visit (INDEPENDENT_AMBULATORY_CARE_PROVIDER_SITE_OTHER): Payer: Medicare HMO

## 2022-02-17 VITALS — BP 167/84 | HR 63 | Resp 16

## 2022-02-17 DIAGNOSIS — N186 End stage renal disease: Secondary | ICD-10-CM

## 2022-02-17 DIAGNOSIS — Z992 Dependence on renal dialysis: Secondary | ICD-10-CM

## 2022-02-17 DIAGNOSIS — Z9862 Peripheral vascular angioplasty status: Secondary | ICD-10-CM

## 2022-02-17 DIAGNOSIS — I10 Essential (primary) hypertension: Secondary | ICD-10-CM | POA: Diagnosis not present

## 2022-02-17 DIAGNOSIS — E1165 Type 2 diabetes mellitus with hyperglycemia: Secondary | ICD-10-CM | POA: Diagnosis not present

## 2022-02-17 DIAGNOSIS — Z794 Long term (current) use of insulin: Secondary | ICD-10-CM

## 2022-02-17 NOTE — H&P (View-Only) (Signed)
Subjective:    Patient ID: [name] Cooper., male    DOB: January 28, 1971, 51 y.o.   MRN: [id number] Chief Complaint  Patient presents with   Follow-up    Ultrasound follow up    [name] is a 51 year old male who presents today for follow-up evaluation of recent intervention on his left upper extremity brachiocephalic AV fistula.  The patient had significant swelling in the right upper extremity.  He underwent angioplasty of the mid cephalic vein and since that time the swelling has drastically decreased and he has been able to resume use of his dialysis access.  He denies any pain or discomfort currently left upper extremity.  He currently has a flow volume of 2830.  No areas of significant stenosis noted.    Review of Systems  Neurological:  Positive for weakness.  All other systems reviewed and are negative.      Objective:   Physical Exam Vitals reviewed.  HENT:     Head: Normocephalic.  Cardiovascular:     Rate and Rhythm: Normal rate.     Pulses:          Radial pulses are 2+ on the left side.  Pulmonary:     Effort: Pulmonary effort is normal.  Skin:    General: Skin is warm and dry.  Neurological:     Mental Status: He is alert.  Psychiatric:        Mood and Affect: Mood normal.        Behavior: Behavior normal.        Thought Content: Thought content normal.        Judgment: Judgment normal.     BP (!) 167/84 (BP Location: Right Arm)   Pulse 63   Resp 16   Past Medical History:  Diagnosis Date   Anxiety    a.) on BZO (clonazepam) PRN   Aortic dilatation ([place]) 11/25/2020   a.) TTE 11/25/2020: Ao root measured 38 mm. b.) TTE 04/22/2021: Ao root measured 44 mm.   Atrial fibrillation ([place])    a.) CHA2DS2-VASc = 6 (CHF, HTN, CVA x2, prior MI, T2DM). b.) rate/rhythm maintained on oral amiodarone + carvedilol; chronically anticoagulated using dose reduced apixaban + clopidogrel   BPH (benign prostatic hyperplasia)    Cardiac arrest ([place]) 07/26/2021    a.) in setting of hyperkalemia, NSTEMI, and seizure following missed HD; pulseless and apneic --> required 1 round of CPR prior to ROSC   Cellulitis    CHF (congestive heart failure) ([place]) 07/19/2017   a.) TTE 07/19/2017: EF 75%; sev LVH; mild MR/PR/TR; G1DD. b.) TTE 11/25/2020: EF 55-60%; mod LVH; GLS -13.0; mild-mod BAE; triv MR; mild dilated PA; G2DD. c.) TTE 04/22/2021: EF 55-60%; sev LVH; sev LAE, triv MR; G1DD.   Chronic diarrhea    Chronic pain of both ankles    Coronary artery disease 02/26/2020   a.) anterior STEMI 02/26/2020 --> LHC/PCI --> EF 65%; 90% mLAD (3.5 x 30 mm Resolyte Onyx DES x 1). b.) NSTEMI 07/26/2021   Diabetes mellitus, type II ([place])    Diabetic neuropathy ([place])    Esophagitis    ESRD (end stage renal disease) on dialysis ([place])    a.) [facility]; T-Th-Sat   Frequent falls    Gait instability    Gastritis    Gastroparesis    GERD (gastroesophageal reflux disease)    H/O enucleation of left eyeball    Heart palpitations    History of kidney stones    HLD (hyperlipidemia)  Hypertension    Insomnia    a.) uses melatonin PRN   Long term current use of anticoagulant    a.) dose reduced apixaban   Long term current use of antithrombotics/antiplatelets    a.) clopidogrel   Nausea and vomiting in adult    recurrent   NSTEMI (non-ST elevated myocardial infarction) ([place]) 07/26/2021   a.) hyperkalemic from missed HD; seizures; decompensated to cardiac arrest and required CPR x 1 round prior to ROSC.   Seizure ([place])    a.) last 07/26/2021 in setting of missed HD --> hyperkalemic at 6.5 --> pulseless/apneic and required CPR; discharged home on levetiracetam.   ST elevation myocardial infarction (STEMI) of anterior wall ([place]) 02/26/2020   a.) LHC/PCI 02/26/2020 --> EF 65%; LVEDP 11 mmHg; 90% mLAD (3.5 x 20 mm Resolute Onyx DES x 1)   Stroke ([place]) 04/2021   a.) documented as x 3 between 04/2021 and 06/2021    Social History   Socioeconomic History   Marital status:  Married    Spouse name: [name]    Number of children: 2   Years of education: Not on file   Highest education level: High school graduate  Occupational History   Occupation: Disability    Comment: not employed  Tobacco Use   Smoking status: Never   Smokeless tobacco: Never  Vaping Use   Vaping Use: Never used  Substance and Sexual Activity   Alcohol use: No   Drug use: No   Sexual activity: Yes  Other Topics Concern   Not on file  Social History Narrative   Oldest son killed in car crash June 2020.    Social Determinants of Health   Financial Resource Strain: High Risk (07/05/2017)   Overall Financial Resource Strain (CARDIA)    Difficulty of Paying Living Expenses: Hard  Food Insecurity: No Food Insecurity (11/26/2021)   Hunger Vital Sign    Worried About Running Out of Food in the Last Year: Never true    Ran Out of Food in the Last Year: Never true  Transportation Needs: No Transportation Needs (11/26/2021)   PRAPARE - Hydrologist (Medical): No    Lack of Transportation (Non-Medical): No  Physical Activity: Inactive (07/05/2017)   Exercise Vital Sign    Days of Exercise per Week: 0 days    Minutes of Exercise per Session: 0 min  Stress: Stress Concern Present (07/05/2017)   [place]    Feeling of Stress : Rather much  Social Connections: Moderately Integrated (07/05/2017)   Social Connection and Isolation Panel [NHANES]    Frequency of Communication with Friends and Family: Three times a week    Frequency of Social Gatherings with Friends and Family: Once a week    Attends Religious Services: More than 4 times per year    Active Member of [employer] or Organizations: No    Attends Archivist Meetings: Never    Marital Status: Married  Human resources officer Violence: Not At Risk (11/26/2021)   Humiliation, Afraid, Rape, and Kick questionnaire    Fear of Current or  Ex-Partner: No    Emotionally Abused: No    Physically Abused: No    Sexually Abused: No    Past Surgical History:  Procedure Laterality Date   A/V FISTULAGRAM Left 08/31/2021   Procedure: A/V Fistulagram;  Surgeon: Algernon Huxley, MD;  Location: [place] [facility];  Service: Cardiovascular;  Laterality: Left;  A/V FISTULAGRAM Left 01/25/2022   Procedure: A/V Fistulagram;  Surgeon: Algernon Huxley, MD;  Location: [place] [facility];  Service: Cardiovascular;  Laterality: Left;   AMPUTATION Left 03/16/2016   Procedure: AMPUTATION DIGIT LEFT HALLUX;  Surgeon: Trula Slade, DPM;  Location: [place];  Service: Podiatry;  Laterality: Left;  can start around 5    AMPUTATION Right 02/09/2021   Procedure: AMPUTATION DIGIT;  Surgeon: Sherilyn Cooter, MD;  Location: [place];  Service: Orthopedics;  Laterality: Right;   ARTHROSCOPIC REPAIR ACL Left    AV FISTULA PLACEMENT Right 05/31/2019   Procedure: Brachiocephalic AV fistula creation;  Surgeon: Algernon Huxley, MD;  Location: [name];  Service: Vascular;  Laterality: Right;   AV FISTULA PLACEMENT Left 08/13/2021   Procedure: ARTERIOVENOUS (AV) FISTULA CREATION (BRACHIALCEPHALIC);  Surgeon: Algernon Huxley, MD;  Location: [name];  Service: Vascular;  Laterality: Left;   COLONOSCOPY WITH PROPOFOL N/A 10/28/2015   Procedure: COLONOSCOPY WITH PROPOFOL;  Surgeon: Lollie Sails, MD;  Location: [facility];  Service: Endoscopy;  Laterality: N/A;   COLONOSCOPY WITH PROPOFOL N/A 10/29/2015   Procedure: COLONOSCOPY WITH PROPOFOL;  Surgeon: Lollie Sails, MD;  Location: [facility];  Service: Endoscopy;  Laterality: N/A;   CORONARY/GRAFT ACUTE MI REVASCULARIZATION N/A 02/26/2020   Procedure: Coronary/Graft Acute MI Revascularization;  Surgeon: Jettie Booze, MD;  Location: [place] [facility];  Service: Cardiovascular;  Laterality: N/A;   DIALYSIS/PERMA CATHETER INSERTION Right 04/26/2019   Perm Cath    DIALYSIS/PERMA CATHETER INSERTION N/A  04/26/2019   Procedure: DIALYSIS/PERMA CATHETER INSERTION;  Surgeon: Algernon Huxley, MD;  Location: [place] [facility];  Service: Cardiovascular;  Laterality: N/A;   DIALYSIS/PERMA CATHETER INSERTION N/A 05/25/2021   Procedure: DIALYSIS/PERMA CATHETER INSERTION;  Surgeon: Algernon Huxley, MD;  Location: [place] [facility];  Service: Cardiovascular;  Laterality: N/A;   DIALYSIS/PERMA CATHETER INSERTION N/A 08/31/2021   Procedure: DIALYSIS/PERMA CATHETER INSERTION;  Surgeon: Algernon Huxley, MD;  Location: [place] [facility];  Service: Cardiovascular;  Laterality: N/A;   DIALYSIS/PERMA CATHETER REMOVAL N/A 08/15/2019   Procedure: DIALYSIS/PERMA CATHETER REMOVAL;  Surgeon: Katha Cabal, MD;  Location: [place] [facility];  Service: Cardiovascular;  Laterality: N/A;   EMBOLIZATION Right 02/06/2021   Procedure: EMBOLIZATION;  Surgeon: Algernon Huxley, MD;  Location: [place] [facility];  Service: Cardiovascular;  Laterality: Right;  Right Upper Extremity Dialysis Access, Permcath Placement   ESOPHAGOGASTRODUODENOSCOPY (EGD) WITH PROPOFOL N/A 12/27/2017   Procedure: ESOPHAGOGASTRODUODENOSCOPY (EGD) WITH PROPOFOL;  Surgeon: [place], Benay Pike, MD;  Location: [name];  Service: Gastroenterology;  Laterality: N/A;   EYE SURGERY     INTRAVASCULAR ULTRASOUND/IVUS N/A 02/26/2020   Procedure: Intravascular Ultrasound/IVUS;  Surgeon: Jettie Booze, MD;  Location: [place] [facility];  Service: Cardiovascular;  Laterality: N/A;   LEFT HEART CATH AND CORONARY ANGIOGRAPHY N/A 02/26/2020   Procedure: LEFT HEART CATH AND CORONARY ANGIOGRAPHY;  Surgeon: Jettie Booze, MD;  Location: [place] [facility];  Service: Cardiovascular;  Laterality: N/A;   PROSTATE SURGERY  2016   TONSILECTOMY/ADENOIDECTOMY WITH MYRINGOTOMY     TONSILLECTOMY     UPPER EXTREMITY ANGIOGRAPHY Right 11/26/2020   Procedure: Upper Extremity Angiography;  Surgeon: Algernon Huxley, MD;  Location: [place] [facility];  Service:  Cardiovascular;  Laterality: Right;   UPPER EXTREMITY ANGIOGRAPHY Right 02/05/2021   Procedure: UPPER EXTREMITY ANGIOGRAPHY;  Surgeon: Algernon Huxley, MD;  Location: [place] [facility];  Service: Cardiovascular;  Laterality:  Right;    Family History  Problem Relation Age of Onset   CAD Father    Stroke Father    Diabetes Mellitus II Mother    Kidney failure Mother    Schizophrenia Mother     Allergies  Allergen Reactions   Promethazine Diarrhea and Other (See Comments)    Muscle cramps   Promethazine Hcl Other (See Comments)    Cramping all over  Other reaction(s): cramp all over   Other     Other reaction(s): Unknown Other reaction(s): Unknown   Malt Other (See Comments)    Cramping       Latest Ref Rng & Units 01/30/2022    7:34 AM 01/09/2022    6:12 PM 12/04/2021   11:12 AM  CBC  WBC 4.0 - 10.5 K/uL 6.3  5.2  6.5   Hemoglobin 13.0 - 17.0 g/dL 9.3  10.4  9.5   Hematocrit 39.0 - 52.0 % 30.9  35.2  31.2   Platelets 150 - 400 K/uL 203  123  218       CMP     Component Value Date/Time   NA 142 01/30/2022 0734   K 5.0 01/30/2022 0734   CL 113 (H) 01/30/2022 0734   CO2 18 (L) 01/30/2022 0734   GLUCOSE 90 01/30/2022 0734   BUN 88 (H) 01/30/2022 0734   CREATININE 10.89 (H) 01/30/2022 0734   CALCIUM 8.5 (L) 01/30/2022 0734   PROT 5.8 (L) 12/04/2021 1112   ALBUMIN 2.5 (L) 12/04/2021 1112   AST 10 (L) 12/04/2021 1112   ALT 6 12/04/2021 1112   ALKPHOS 78 12/04/2021 1112   BILITOT 0.3 12/04/2021 1112   GFRNONAA 5 (L) 01/30/2022 0734   GFRAA 5 (L) 08/27/2019 1540     No results found.     Assessment & Plan:   1. ESRD on hemodialysis [facility]) Recommend:  The patient is doing well and currently has adequate dialysis access. The patient's dialysis center is not reporting any access issues. Flow pattern is stable when compared to the prior ultrasound.  The patient should have a duplex ultrasound of the dialysis access in 6 months. The patient will follow-up  with me in the office after each ultrasound    2. Essential hypertension, benign Continue antihypertensive medications as already ordered, these medications have been reviewed and there are no changes at this time.  3. Uncontrolled type 2 diabetes mellitus with hyperglycemia, with long-term current use of insulin (HCC) Continue hypoglycemic medications as already ordered, these medications have been reviewed and there are no changes at this time.  Hgb A1C to be monitored as already arranged by primary service   Current Outpatient Medications on File Prior to Visit  Medication Sig Dispense Refill   acarbose (PRECOSE) 50 MG tablet Take 50 mg by mouth 3 (three) times daily.     acetaminophen (TYLENOL) 325 MG tablet Take 1-2 tablets (325-650 mg total) by mouth every 4 (four) hours as needed for mild pain.     amiodarone (PACERONE) 200 MG tablet Take 1 tablet (200 mg total) by mouth every morning. 90 tablet 3   amLODipine (NORVASC) 10 MG tablet Take 1 tablet (10 mg total) by mouth daily. 30 tablet 2   calcium acetate (PHOSLO) 667 MG capsule Take 2 capsules (1,334 mg total) by mouth 3 (three) times daily with meals. 180 capsule 0   carvedilol (COREG) 25 MG tablet Take 25 mg by mouth 2 (two) times daily.     cholestyramine (QUESTRAN) 4  GM/DOSE powder Take 4 g by mouth 2 (two) times daily.     cloNIDine (CATAPRES) 0.3 MG tablet Take 1 tablet (0.3 mg total) by mouth every 8 (eight) hours. (Patient taking differently: Take 0.3 mg by mouth every 8 (eight) hours. Only if BP over 200) 90 tablet 2   dorzolamide-timolol (COSOPT) 22.3-6.8 MG/ML ophthalmic solution Place 1 drop into the right eye 2 (two) times daily.     hydrocortisone (ANUSOL-HC) 2.5 % rectal cream Place 1 Application rectally 2 (two) times daily.     hydrocortisone cream 1 %      irbesartan (AVAPRO) 300 MG tablet Take 1 tablet (300 mg total) by mouth daily. 30 tablet 2   levETIRAcetam (KEPPRA) 250 MG tablet Take 1 tablet (250 mg total) by  mouth Every Tuesday,Thursday,and Saturday with dialysis. 15 tablet 1   levETIRAcetam (KEPPRA) 750 MG tablet Take 1 tablet (750 mg total) by mouth 2 (two) times daily. (Patient taking differently: Take 750 mg by mouth 2 (two) times daily. Twice daily Only on dialysis days and once on all other days) 60 tablet 2   lidocaine-prilocaine (EMLA) cream Apply topically.     omeprazole (PRILOSEC) 40 MG capsule Take 40 mg by mouth as needed.     ondansetron (ZOFRAN-ODT) 4 MG disintegrating tablet Take 4 mg by mouth every 12 (twelve) hours as needed for vomiting or nausea.     oxyCODONE-acetaminophen (PERCOCET/ROXICET) 5-325 MG tablet Take 1 tablet by mouth every 4 (four) hours as needed for severe pain. 20 tablet 0   atorvastatin (LIPITOR) 80 MG tablet Take 1 tablet (80 mg total) by mouth daily. (Patient not taking: Reported on 01/25/2022) 30 tablet 0   nitroGLYCERIN (NITROSTAT) 0.4 MG SL tablet Place 1 tablet (0.4 mg total) under the tongue every 5 (five) minutes as needed for chest pain.     tamsulosin (FLOMAX) 0.4 MG CAPS capsule Take 0.4 mg by mouth daily. (Patient not taking: Reported on 01/09/2022)     No current facility-administered medications on file prior to visit.    There are no Patient Instructions on file for this visit. No follow-ups on file.   Kris Hartmann, NP

## 2022-02-17 NOTE — Progress Notes (Signed)
Subjective:    Patient ID: [name]., male    DOB: 04-Dec-1970, 51 y.o.   MRN: [id number] Chief Complaint  Patient presents with   Follow-up    Ultrasound follow up    [name] is a 51 year old male who presents today for follow-up evaluation of recent intervention on his left upper extremity brachiocephalic AV fistula.  The patient had significant swelling in the right upper extremity.  He underwent angioplasty of the mid cephalic vein and since that time the swelling has drastically decreased and he has been able to resume use of his dialysis access.  He denies any pain or discomfort currently left upper extremity.  He currently has a flow volume of 2830.  No areas of significant stenosis noted.    Review of Systems  Neurological:  Positive for weakness.  All other systems reviewed and are negative.      Objective:   Physical Exam Vitals reviewed.  HENT:     Head: Normocephalic.  Cardiovascular:     Rate and Rhythm: Normal rate.     Pulses:          Radial pulses are 2+ on the left side.  Pulmonary:     Effort: Pulmonary effort is normal.  Skin:    General: Skin is warm and dry.  Neurological:     Mental Status: He is alert.  Psychiatric:        Mood and Affect: Mood normal.        Behavior: Behavior normal.        Thought Content: Thought content normal.        Judgment: Judgment normal.     BP (!) 167/84 (BP Location: Right Arm)   Pulse 63   Resp 16   Past Medical History:  Diagnosis Date   Anxiety    a.) on BZO (clonazepam) PRN   Aortic dilatation ([place]) 11/25/2020   a.) TTE 11/25/2020: Ao root measured 38 mm. b.) TTE 04/22/2021: Ao root measured 44 mm.   Atrial fibrillation ([place])    a.) CHA2DS2-VASc = 6 (CHF, HTN, CVA x2, prior MI, T2DM). b.) rate/rhythm maintained on oral amiodarone + carvedilol; chronically anticoagulated using dose reduced apixaban + clopidogrel   BPH (benign prostatic hyperplasia)    Cardiac arrest ([place]) 07/26/2021    a.) in setting of hyperkalemia, NSTEMI, and seizure following missed HD; pulseless and apneic --> required 1 round of CPR prior to ROSC   Cellulitis    CHF (congestive heart failure) ([place]) 07/19/2017   a.) TTE 07/19/2017: EF 75%; sev LVH; mild MR/PR/TR; G1DD. b.) TTE 11/25/2020: EF 55-60%; mod LVH; GLS -13.0; mild-mod BAE; triv MR; mild dilated PA; G2DD. c.) TTE 04/22/2021: EF 55-60%; sev LVH; sev LAE, triv MR; G1DD.   Chronic diarrhea    Chronic pain of both ankles    Coronary artery disease 02/26/2020   a.) anterior STEMI 02/26/2020 --> LHC/PCI --> EF 65%; 90% mLAD (3.5 x 30 mm Resolyte Onyx DES x 1). b.) NSTEMI 07/26/2021   Diabetes mellitus, type II ([place])    Diabetic neuropathy ([place])    Esophagitis    ESRD (end stage renal disease) on dialysis ([place])    a.) [facility]; T-Th-Sat   Frequent falls    Gait instability    Gastritis    Gastroparesis    GERD (gastroesophageal reflux disease)    H/O enucleation of left eyeball    Heart palpitations    History of kidney stones    HLD (hyperlipidemia)  Hypertension    Insomnia    a.) uses melatonin PRN   Long term current use of anticoagulant    a.) dose reduced apixaban   Long term current use of antithrombotics/antiplatelets    a.) clopidogrel   Nausea and vomiting in adult    recurrent   NSTEMI (non-ST elevated myocardial infarction) ([place]) 07/26/2021   a.) hyperkalemic from missed HD; seizures; decompensated to cardiac arrest and required CPR x 1 round prior to ROSC.   Seizure ([place])    a.) last 07/26/2021 in setting of missed HD --> hyperkalemic at 6.5 --> pulseless/apneic and required CPR; discharged home on levetiracetam.   ST elevation myocardial infarction (STEMI) of anterior wall ([place]) 02/26/2020   a.) LHC/PCI 02/26/2020 --> EF 65%; LVEDP 11 mmHg; 90% mLAD (3.5 x 20 mm Resolute Onyx DES x 1)   Stroke ([place]) 04/2021   a.) documented as x 3 between 04/2021 and 06/2021    Social History   Socioeconomic History   Marital status:  Married    Spouse name: [name]    Number of children: 2   Years of education: Not on file   Highest education level: High school graduate  Occupational History   Occupation: Disability    Comment: not employed  Tobacco Use   Smoking status: Never   Smokeless tobacco: Never  Vaping Use   Vaping Use: Never used  Substance and Sexual Activity   Alcohol use: No   Drug use: No   Sexual activity: Yes  Other Topics Concern   Not on file  Social History Narrative   Oldest son killed in car crash June 2020.    Social Determinants of Health   Financial Resource Strain: High Risk (07/05/2017)   Overall Financial Resource Strain (CARDIA)    Difficulty of Paying Living Expenses: Hard  Food Insecurity: No Food Insecurity (11/26/2021)   Hunger Vital Sign    Worried About Running Out of Food in the Last Year: Never true    Ran Out of Food in the Last Year: Never true  Transportation Needs: No Transportation Needs (11/26/2021)   PRAPARE - Hydrologist (Medical): No    Lack of Transportation (Non-Medical): No  Physical Activity: Inactive (07/05/2017)   Exercise Vital Sign    Days of Exercise per Week: 0 days    Minutes of Exercise per Session: 0 min  Stress: Stress Concern Present (07/05/2017)   [place]    Feeling of Stress : Rather much  Social Connections: Moderately Integrated (07/05/2017)   Social Connection and Isolation Panel [NHANES]    Frequency of Communication with Friends and Family: Three times a week    Frequency of Social Gatherings with Friends and Family: Once a week    Attends Religious Services: More than 4 times per year    Active Member of [employer] or Organizations: No    Attends Archivist Meetings: Never    Marital Status: Married  Human resources officer Violence: Not At Risk (11/26/2021)   Humiliation, Afraid, Rape, and Kick questionnaire    Fear of Current or  Ex-Partner: No    Emotionally Abused: No    Physically Abused: No    Sexually Abused: No    Past Surgical History:  Procedure Laterality Date   A/V FISTULAGRAM Left 08/31/2021   Procedure: A/V Fistulagram;  Surgeon: Algernon Huxley, MD;  Location: [facility];  Service: Cardiovascular;  Laterality: Left;  A/V FISTULAGRAM Left 01/25/2022   Procedure: A/V Fistulagram;  Surgeon: Algernon Huxley, MD;  Location: [facility];  Service: Cardiovascular;  Laterality: Left;   AMPUTATION Left 03/16/2016   Procedure: AMPUTATION DIGIT LEFT HALLUX;  Surgeon: Trula Slade, DPM;  Location: [facility];  Service: Podiatry;  Laterality: Left;  can start around 5    AMPUTATION Right 02/09/2021   Procedure: AMPUTATION DIGIT;  Surgeon: Sherilyn Cooter, MD;  Location: [facility];  Service: Orthopedics;  Laterality: Right;   ARTHROSCOPIC REPAIR ACL Left    AV FISTULA PLACEMENT Right 05/31/2019   Procedure: Brachiocephalic AV fistula creation;  Surgeon: Algernon Huxley, MD;  Location: [name];  Service: Vascular;  Laterality: Right;   AV FISTULA PLACEMENT Left 08/13/2021   Procedure: ARTERIOVENOUS (AV) FISTULA CREATION (BRACHIALCEPHALIC);  Surgeon: Algernon Huxley, MD;  Location: [name];  Service: Vascular;  Laterality: Left;   COLONOSCOPY WITH PROPOFOL N/A 10/28/2015   Procedure: COLONOSCOPY WITH PROPOFOL;  Surgeon: Lollie Sails, MD;  Location: [facility];  Service: Endoscopy;  Laterality: N/A;   COLONOSCOPY WITH PROPOFOL N/A 10/29/2015   Procedure: COLONOSCOPY WITH PROPOFOL;  Surgeon: Lollie Sails, MD;  Location: [facility] ENDOSCOPY;  Service: Endoscopy;  Laterality: N/A;   CORONARY/GRAFT ACUTE MI REVASCULARIZATION N/A 02/26/2020   Procedure: Coronary/Graft Acute MI Revascularization;  Surgeon: Jettie Booze, MD;  Location: [facility];  Service: Cardiovascular;  Laterality: N/A;   DIALYSIS/PERMA CATHETER INSERTION Right 04/26/2019   Perm Cath    DIALYSIS/PERMA CATHETER INSERTION N/A  04/26/2019   Procedure: DIALYSIS/PERMA CATHETER INSERTION;  Surgeon: Algernon Huxley, MD;  Location: [facility];  Service: Cardiovascular;  Laterality: N/A;   DIALYSIS/PERMA CATHETER INSERTION N/A 05/25/2021   Procedure: DIALYSIS/PERMA CATHETER INSERTION;  Surgeon: Algernon Huxley, MD;  Location: [facility];  Service: Cardiovascular;  Laterality: N/A;   DIALYSIS/PERMA CATHETER INSERTION N/A 08/31/2021   Procedure: DIALYSIS/PERMA CATHETER INSERTION;  Surgeon: Algernon Huxley, MD;  Location: [facility];  Service: Cardiovascular;  Laterality: N/A;   DIALYSIS/PERMA CATHETER REMOVAL N/A 08/15/2019   Procedure: DIALYSIS/PERMA CATHETER REMOVAL;  Surgeon: Katha Cabal, MD;  Location: [facility];  Service: Cardiovascular;  Laterality: N/A;   EMBOLIZATION Right 02/06/2021   Procedure: EMBOLIZATION;  Surgeon: Algernon Huxley, MD;  Location: [facility];  Service: Cardiovascular;  Laterality: Right;  Right Upper Extremity Dialysis Access, Permcath Placement   ESOPHAGOGASTRODUODENOSCOPY (EGD) WITH PROPOFOL N/A 12/27/2017   Procedure: ESOPHAGOGASTRODUODENOSCOPY (EGD) WITH PROPOFOL;  Surgeon: [place], Benay Pike, MD;  Location: [name] ENDOSCOPY;  Service: Gastroenterology;  Laterality: N/A;   EYE SURGERY     INTRAVASCULAR ULTRASOUND/IVUS N/A 02/26/2020   Procedure: Intravascular Ultrasound/IVUS;  Surgeon: Jettie Booze, MD;  Location: [facility];  Service: Cardiovascular;  Laterality: N/A;   LEFT HEART CATH AND CORONARY ANGIOGRAPHY N/A 02/26/2020   Procedure: LEFT HEART CATH AND CORONARY ANGIOGRAPHY;  Surgeon: Jettie Booze, MD;  Location: [facility];  Service: Cardiovascular;  Laterality: N/A;   PROSTATE SURGERY  2016   TONSILECTOMY/ADENOIDECTOMY WITH MYRINGOTOMY     TONSILLECTOMY     UPPER EXTREMITY ANGIOGRAPHY Right 11/26/2020   Procedure: Upper Extremity Angiography;  Surgeon: Algernon Huxley, MD;  Location: [facility];  Service:  Cardiovascular;  Laterality: Right;   UPPER EXTREMITY ANGIOGRAPHY Right 02/05/2021   Procedure: UPPER EXTREMITY ANGIOGRAPHY;  Surgeon: Algernon Huxley, MD;  Location: [facility];  Service: Cardiovascular;  Laterality:  Right;    Family History  Problem Relation Age of Onset   CAD Father    Stroke Father    Diabetes Mellitus II Mother    Kidney failure Mother    Schizophrenia Mother     Allergies  Allergen Reactions   Promethazine Diarrhea and Other (See Comments)    Muscle cramps   Promethazine Hcl Other (See Comments)    Cramping all over  Other reaction(s): cramp all over   Other     Other reaction(s): Unknown Other reaction(s): Unknown   Malt Other (See Comments)    Cramping       Latest Ref Rng & Units 01/30/2022    7:34 AM 01/09/2022    6:12 PM 12/04/2021   11:12 AM  CBC  WBC 4.0 - 10.5 K/uL 6.3  5.2  6.5   Hemoglobin 13.0 - 17.0 g/dL 9.3  10.4  9.5   Hematocrit 39.0 - 52.0 % 30.9  35.2  31.2   Platelets 150 - 400 K/uL 203  123  218       CMP     Component Value Date/Time   NA 142 01/30/2022 0734   K 5.0 01/30/2022 0734   CL 113 (H) 01/30/2022 0734   CO2 18 (L) 01/30/2022 0734   GLUCOSE 90 01/30/2022 0734   BUN 88 (H) 01/30/2022 0734   CREATININE 10.89 (H) 01/30/2022 0734   CALCIUM 8.5 (L) 01/30/2022 0734   PROT 5.8 (L) 12/04/2021 1112   ALBUMIN 2.5 (L) 12/04/2021 1112   AST 10 (L) 12/04/2021 1112   ALT 6 12/04/2021 1112   ALKPHOS 78 12/04/2021 1112   BILITOT 0.3 12/04/2021 1112   GFRNONAA 5 (L) 01/30/2022 0734   GFRAA 5 (L) 08/27/2019 1540     No results found.     Assessment & Plan:   1. ESRD on hemodialysis [facility]) Recommend:  The patient is doing well and currently has adequate dialysis access. The patient's dialysis center is not reporting any access issues. Flow pattern is stable when compared to the prior ultrasound.  The patient should have a duplex ultrasound of the dialysis access in 6 months. The patient will follow-up  with me in the office after each ultrasound    2. Essential hypertension, benign Continue antihypertensive medications as already ordered, these medications have been reviewed and there are no changes at this time.  3. Uncontrolled type 2 diabetes mellitus with hyperglycemia, with long-term current use of insulin (HCC) Continue hypoglycemic medications as already ordered, these medications have been reviewed and there are no changes at this time.  Hgb A1C to be monitored as already arranged by primary service   Current Outpatient Medications on File Prior to Visit  Medication Sig Dispense Refill   acarbose (PRECOSE) 50 MG tablet Take 50 mg by mouth 3 (three) times daily.     acetaminophen (TYLENOL) 325 MG tablet Take 1-2 tablets (325-650 mg total) by mouth every 4 (four) hours as needed for mild pain.     amiodarone (PACERONE) 200 MG tablet Take 1 tablet (200 mg total) by mouth every morning. 90 tablet 3   amLODipine (NORVASC) 10 MG tablet Take 1 tablet (10 mg total) by mouth daily. 30 tablet 2   calcium acetate (PHOSLO) 667 MG capsule Take 2 capsules (1,334 mg total) by mouth 3 (three) times daily with meals. 180 capsule 0   carvedilol (COREG) 25 MG tablet Take 25 mg by mouth 2 (two) times daily.     cholestyramine (QUESTRAN) 4  GM/DOSE powder Take 4 g by mouth 2 (two) times daily.     cloNIDine (CATAPRES) 0.3 MG tablet Take 1 tablet (0.3 mg total) by mouth every 8 (eight) hours. (Patient taking differently: Take 0.3 mg by mouth every 8 (eight) hours. Only if BP over 200) 90 tablet 2   dorzolamide-timolol (COSOPT) 22.3-6.8 MG/ML ophthalmic solution Place 1 drop into the right eye 2 (two) times daily.     hydrocortisone (ANUSOL-HC) 2.5 % rectal cream Place 1 Application rectally 2 (two) times daily.     hydrocortisone cream 1 %      irbesartan (AVAPRO) 300 MG tablet Take 1 tablet (300 mg total) by mouth daily. 30 tablet 2   levETIRAcetam (KEPPRA) 250 MG tablet Take 1 tablet (250 mg total) by  mouth Every Tuesday,Thursday,and Saturday with dialysis. 15 tablet 1   levETIRAcetam (KEPPRA) 750 MG tablet Take 1 tablet (750 mg total) by mouth 2 (two) times daily. (Patient taking differently: Take 750 mg by mouth 2 (two) times daily. Twice daily Only on dialysis days and once on all other days) 60 tablet 2   lidocaine-prilocaine (EMLA) cream Apply topically.     omeprazole (PRILOSEC) 40 MG capsule Take 40 mg by mouth as needed.     ondansetron (ZOFRAN-ODT) 4 MG disintegrating tablet Take 4 mg by mouth every 12 (twelve) hours as needed for vomiting or nausea.     oxyCODONE-acetaminophen (PERCOCET/ROXICET) 5-325 MG tablet Take 1 tablet by mouth every 4 (four) hours as needed for severe pain. 20 tablet 0   atorvastatin (LIPITOR) 80 MG tablet Take 1 tablet (80 mg total) by mouth daily. (Patient not taking: Reported on 01/25/2022) 30 tablet 0   nitroGLYCERIN (NITROSTAT) 0.4 MG SL tablet Place 1 tablet (0.4 mg total) under the tongue every 5 (five) minutes as needed for chest pain.     tamsulosin (FLOMAX) 0.4 MG CAPS capsule Take 0.4 mg by mouth daily. (Patient not taking: Reported on 01/09/2022)     No current facility-administered medications on file prior to visit.    There are no Patient Instructions on file for this visit. No follow-ups on file.   Kris Hartmann, NP

## 2022-02-18 ENCOUNTER — Telehealth: Payer: Self-pay | Admitting: Interventional Cardiology

## 2022-02-18 NOTE — Telephone Encounter (Signed)
Pt would like a callback from nurse regarding surgery. Please advise

## 2022-02-18 NOTE — Telephone Encounter (Signed)
I spoke with patient. He reports he was going to have endoscopy but was told it could not be done at this time due to his heart issues.  Patient states he needs to have procedure done.  I told him I did not see a cardiac clearance request for endoscopy in his chart.  I asked him to reach out to provider doing procedure and ask for cardiac clearance request to be sent to Dr Irish Lack.

## 2022-02-19 NOTE — Telephone Encounter (Signed)
   Name: [name].  DOB: [birth year]  MRN: [id number]  Primary Cardiologist: Larae Grooms, MD   Preoperative team, please contact this patient and set up a phone call appointment for further preoperative risk assessment. Please obtain consent and complete medication review. Thank you for your help.  I confirm that guidance regarding antiplatelet and oral anticoagulation therapy has been completed and, if necessary, noted below (none requested).    Lenna Sciara, NP 02/19/2022, 4:58 PM [place]

## 2022-02-19 NOTE — Telephone Encounter (Signed)
Pt is requesting to speak to a nurse again in regards to a "request form" that he needs for upcoming procedure. Requesting return call.

## 2022-02-19 NOTE — Telephone Encounter (Signed)
   Pre-operative Risk Assessment    Patient Name: [name].  DOB: Jul 02, 1970 MRN: [id number]      Request for Surgical Clearance    Procedure:   EGD  Date of Surgery:  Clearance TBD  (CLEARANCE FORM SAYS 07/06/22, HOWEVER; THE PT STATES THAT GI OFFICE SAID AS SOON AS HE HAS BEEN CLEARED THEY CAN GET HIS PROCEDURE MOVED UP.) PT STATES HE NEEDS TO GET THIS PROCEDURE DONE ASAP.                                Surgeon:  [name] Cover, MD  Surgeon's Group or Practice Name:  [facility]; [place]  Phone number:  [phone] Fax number:  [phone]   Type of Clearance Requested:   - Medical ; NO MEDICATIONS LISTED AS NEEDING TO BE HELD   Type of Anesthesia:   PROPOFOL   Additional requests/questions:    [name]   02/19/2022, 4:46 PM

## 2022-02-23 NOTE — Telephone Encounter (Signed)
Left message for the pt to call back to schedule a tele pre op appt 

## 2022-02-25 ENCOUNTER — Telehealth: Payer: Self-pay | Admitting: *Deleted

## 2022-02-25 NOTE — Telephone Encounter (Signed)
I s/w the pt and he would like tele appt next week if we could, as he is hoping his procedure can be moved up sooner, see notes. 03/03/22 @ 9 am. Med rec and consent are done.       Patient Consent for Virtual Visit        [name]. has provided verbal consent on 02/25/2022 for a virtual visit (video or telephone).   CONSENT FOR VIRTUAL VISIT FOR:  [name].  By participating in this virtual visit I agree to the following:  I hereby voluntarily request, consent and authorize [facility] and its employed or contracted physicians, physician assistants, nurse practitioners or other licensed health care professionals (the Practitioner), to provide me with telemedicine health care services (the "Services") as deemed necessary by the treating Practitioner. I acknowledge and consent to receive the Services by the Practitioner via telemedicine. I understand that the telemedicine visit will involve communicating with the Practitioner through live audiovisual communication technology and the disclosure of certain medical information by electronic transmission. I acknowledge that I have been given the opportunity to request an in-person assessment or other available alternative prior to the telemedicine visit and am voluntarily participating in the telemedicine visit.  I understand that I have the right to withhold or withdraw my consent to the use of telemedicine in the course of my care at any time, without affecting my right to future care or treatment, and that the Practitioner or I may terminate the telemedicine visit at any time. I understand that I have the right to inspect all information obtained and/or recorded in the course of the telemedicine visit and may receive copies of available information for a reasonable fee.  I understand that some of the potential risks of receiving the Services via telemedicine include:  Delay or interruption in medical evaluation due to  technological equipment failure or disruption; Information transmitted may not be sufficient (e.g. poor resolution of images) to allow for appropriate medical decision making by the Practitioner; and/or  In rare instances, security protocols could fail, causing a breach of personal health information.  Furthermore, I acknowledge that it is my responsibility to provide information about my medical history, conditions and care that is complete and accurate to the best of my ability. I acknowledge that Practitioner's advice, recommendations, and/or decision may be based on factors not within their control, such as incomplete or inaccurate data provided by me or distortions of diagnostic images or specimens that may result from electronic transmissions. I understand that the practice of medicine is not an exact science and that Practitioner makes no warranties or guarantees regarding treatment outcomes. I acknowledge that a copy of this consent can be made available to me via my patient portal ([facility]), or I can request a printed copy by calling the office of [facility].    I understand that my insurance will be billed for this visit.   I have read or had this consent read to me. I understand the contents of this consent, which adequately explains the benefits and risks of the Services being provided via telemedicine.  I have been provided ample opportunity to ask questions regarding this consent and the Services and have had my questions answered to my satisfaction. I give my informed consent for the services to be provided through the use of telemedicine in my medical care

## 2022-02-25 NOTE — Telephone Encounter (Signed)
I s/w the pt and he would like tele appt next week if we could, as he is hoping his procedure can be moved up sooner, see notes. 03/03/22 @ 9 am. Med rec and consent are done.

## 2022-02-26 ENCOUNTER — Ambulatory Visit: Payer: Medicare HMO | Admitting: Podiatry

## 2022-02-26 ENCOUNTER — Ambulatory Visit (INDEPENDENT_AMBULATORY_CARE_PROVIDER_SITE_OTHER): Payer: Medicare HMO

## 2022-02-26 ENCOUNTER — Other Ambulatory Visit: Payer: Self-pay

## 2022-02-26 ENCOUNTER — Inpatient Hospital Stay
Admission: EM | Admit: 2022-02-26 | Discharge: 2022-02-28 | DRG: 617 | Disposition: A | Discharge status: Home / Self Care | Payer: Medicare HMO | Attending: Internal Medicine | Admitting: Internal Medicine

## 2022-02-26 ENCOUNTER — Emergency Department: Payer: Medicare HMO

## 2022-02-26 VITALS — BP 118/52 | HR 61

## 2022-02-26 DIAGNOSIS — I480 Paroxysmal atrial fibrillation: Secondary | ICD-10-CM | POA: Diagnosis present

## 2022-02-26 DIAGNOSIS — D631 Anemia in chronic kidney disease: Secondary | ICD-10-CM | POA: Diagnosis present

## 2022-02-26 DIAGNOSIS — I252 Old myocardial infarction: Secondary | ICD-10-CM | POA: Diagnosis not present

## 2022-02-26 DIAGNOSIS — D649 Anemia, unspecified: Secondary | ICD-10-CM

## 2022-02-26 DIAGNOSIS — Z9001 Acquired absence of eye: Secondary | ICD-10-CM

## 2022-02-26 DIAGNOSIS — L97519 Non-pressure chronic ulcer of other part of right foot with unspecified severity: Secondary | ICD-10-CM | POA: Diagnosis present

## 2022-02-26 DIAGNOSIS — I5030 Unspecified diastolic (congestive) heart failure: Secondary | ICD-10-CM | POA: Diagnosis not present

## 2022-02-26 DIAGNOSIS — M79671 Pain in right foot: Secondary | ICD-10-CM

## 2022-02-26 DIAGNOSIS — N186 End stage renal disease: Secondary | ICD-10-CM | POA: Diagnosis present

## 2022-02-26 DIAGNOSIS — G40909 Epilepsy, unspecified, not intractable, without status epilepticus: Secondary | ICD-10-CM | POA: Diagnosis present

## 2022-02-26 DIAGNOSIS — Z8673 Personal history of transient ischemic attack (TIA), and cerebral infarction without residual deficits: Secondary | ICD-10-CM

## 2022-02-26 DIAGNOSIS — Z8674 Personal history of sudden cardiac arrest: Secondary | ICD-10-CM

## 2022-02-26 DIAGNOSIS — Z841 Family history of disorders of kidney and ureter: Secondary | ICD-10-CM

## 2022-02-26 DIAGNOSIS — E875 Hyperkalemia: Secondary | ICD-10-CM | POA: Diagnosis present

## 2022-02-26 DIAGNOSIS — Z79899 Other long term (current) drug therapy: Secondary | ICD-10-CM

## 2022-02-26 DIAGNOSIS — Z87891 Personal history of nicotine dependence: Secondary | ICD-10-CM

## 2022-02-26 DIAGNOSIS — I2510 Atherosclerotic heart disease of native coronary artery without angina pectoris: Secondary | ICD-10-CM | POA: Diagnosis present

## 2022-02-26 DIAGNOSIS — M869 Osteomyelitis, unspecified: Principal | ICD-10-CM

## 2022-02-26 DIAGNOSIS — I739 Peripheral vascular disease, unspecified: Secondary | ICD-10-CM | POA: Insufficient documentation

## 2022-02-26 DIAGNOSIS — E785 Hyperlipidemia, unspecified: Secondary | ICD-10-CM | POA: Diagnosis present

## 2022-02-26 DIAGNOSIS — I5032 Chronic diastolic (congestive) heart failure: Secondary | ICD-10-CM | POA: Diagnosis present

## 2022-02-26 DIAGNOSIS — I2583 Coronary atherosclerosis due to lipid rich plaque: Secondary | ICD-10-CM | POA: Diagnosis not present

## 2022-02-26 DIAGNOSIS — N400 Enlarged prostate without lower urinary tract symptoms: Secondary | ICD-10-CM | POA: Diagnosis present

## 2022-02-26 DIAGNOSIS — E1122 Type 2 diabetes mellitus with diabetic chronic kidney disease: Secondary | ICD-10-CM | POA: Diagnosis present

## 2022-02-26 DIAGNOSIS — Z955 Presence of coronary angioplasty implant and graft: Secondary | ICD-10-CM | POA: Diagnosis not present

## 2022-02-26 DIAGNOSIS — Z888 Allergy status to other drugs, medicaments and biological substances status: Secondary | ICD-10-CM | POA: Diagnosis not present

## 2022-02-26 DIAGNOSIS — M86171 Other acute osteomyelitis, right ankle and foot: Secondary | ICD-10-CM

## 2022-02-26 DIAGNOSIS — Z7901 Long term (current) use of anticoagulants: Secondary | ICD-10-CM | POA: Diagnosis not present

## 2022-02-26 DIAGNOSIS — K3184 Gastroparesis: Secondary | ICD-10-CM | POA: Diagnosis present

## 2022-02-26 DIAGNOSIS — I3139 Other pericardial effusion (noninflammatory): Secondary | ICD-10-CM | POA: Diagnosis not present

## 2022-02-26 DIAGNOSIS — Z01818 Encounter for other preprocedural examination: Secondary | ICD-10-CM

## 2022-02-26 DIAGNOSIS — E1143 Type 2 diabetes mellitus with diabetic autonomic (poly)neuropathy: Secondary | ICD-10-CM | POA: Diagnosis present

## 2022-02-26 DIAGNOSIS — M79672 Pain in left foot: Secondary | ICD-10-CM

## 2022-02-26 DIAGNOSIS — E1140 Type 2 diabetes mellitus with diabetic neuropathy, unspecified: Secondary | ICD-10-CM | POA: Diagnosis present

## 2022-02-26 DIAGNOSIS — I10 Essential (primary) hypertension: Secondary | ICD-10-CM | POA: Diagnosis present

## 2022-02-26 DIAGNOSIS — M86671 Other chronic osteomyelitis, right ankle and foot: Secondary | ICD-10-CM | POA: Diagnosis not present

## 2022-02-26 DIAGNOSIS — Z818 Family history of other mental and behavioral disorders: Secondary | ICD-10-CM

## 2022-02-26 DIAGNOSIS — Z89421 Acquired absence of other right toe(s): Secondary | ICD-10-CM

## 2022-02-26 DIAGNOSIS — E119 Type 2 diabetes mellitus without complications: Secondary | ICD-10-CM

## 2022-02-26 DIAGNOSIS — Z992 Dependence on renal dialysis: Secondary | ICD-10-CM | POA: Diagnosis not present

## 2022-02-26 DIAGNOSIS — E1152 Type 2 diabetes mellitus with diabetic peripheral angiopathy with gangrene: Secondary | ICD-10-CM | POA: Diagnosis present

## 2022-02-26 DIAGNOSIS — Z87898 Personal history of other specified conditions: Secondary | ICD-10-CM

## 2022-02-26 DIAGNOSIS — E1169 Type 2 diabetes mellitus with other specified complication: Principal | ICD-10-CM | POA: Diagnosis present

## 2022-02-26 DIAGNOSIS — Z823 Family history of stroke: Secondary | ICD-10-CM

## 2022-02-26 DIAGNOSIS — Z8679 Personal history of other diseases of the circulatory system: Secondary | ICD-10-CM

## 2022-02-26 DIAGNOSIS — Z833 Family history of diabetes mellitus: Secondary | ICD-10-CM

## 2022-02-26 DIAGNOSIS — I132 Hypertensive heart and chronic kidney disease with heart failure and with stage 5 chronic kidney disease, or end stage renal disease: Secondary | ICD-10-CM | POA: Diagnosis present

## 2022-02-26 DIAGNOSIS — Z8249 Family history of ischemic heart disease and other diseases of the circulatory system: Secondary | ICD-10-CM

## 2022-02-26 DIAGNOSIS — Z89422 Acquired absence of other left toe(s): Secondary | ICD-10-CM

## 2022-02-26 DIAGNOSIS — K219 Gastro-esophageal reflux disease without esophagitis: Secondary | ICD-10-CM | POA: Diagnosis present

## 2022-02-26 HISTORY — DX: Other pericardial effusion (noninflammatory): I31.39

## 2022-02-26 HISTORY — DX: Osteomyelitis, unspecified: M86.9

## 2022-02-26 HISTORY — DX: Nontraumatic intracerebral hemorrhage, unspecified: I61.9

## 2022-02-26 HISTORY — DX: Other acute osteomyelitis, right ankle and foot: M86.171

## 2022-02-26 LAB — CBC WITH DIFFERENTIAL/PLATELET
Abs Immature Granulocytes: 0.02 10*3/uL (ref 0.00–0.07)
Basophils Absolute: 0.1 10*3/uL (ref 0.0–0.1)
Basophils Relative: 1 %
Eosinophils Absolute: 0.4 10*3/uL (ref 0.0–0.5)
Eosinophils Relative: 6 %
HCT: 27.4 % — ABNORMAL LOW (ref 39.0–52.0)
Hemoglobin: 7.8 g/dL — ABNORMAL LOW (ref 13.0–17.0)
Immature Granulocytes: 0 %
Lymphocytes Relative: 28 %
Lymphs Abs: 1.8 10*3/uL (ref 0.7–4.0)
MCH: 20.5 pg — ABNORMAL LOW (ref 26.0–34.0)
MCHC: 28.5 g/dL — ABNORMAL LOW (ref 30.0–36.0)
MCV: 72.1 fL — ABNORMAL LOW (ref 80.0–100.0)
Monocytes Absolute: 0.7 10*3/uL (ref 0.1–1.0)
Monocytes Relative: 11 %
Neutro Abs: 3.5 10*3/uL (ref 1.7–7.7)
Neutrophils Relative %: 54 %
Platelets: 154 10*3/uL (ref 150–400)
RBC: 3.8 MIL/uL — ABNORMAL LOW (ref 4.22–5.81)
RDW: 28.1 % — ABNORMAL HIGH (ref 11.5–15.5)
Smear Review: NORMAL
WBC: 6.5 10*3/uL (ref 4.0–10.5)
nRBC: 0.5 % — ABNORMAL HIGH (ref 0.0–0.2)

## 2022-02-26 LAB — COMPREHENSIVE METABOLIC PANEL
ALT: 12 U/L (ref 0–44)
AST: 13 U/L — ABNORMAL LOW (ref 15–41)
Albumin: 3.3 g/dL — ABNORMAL LOW (ref 3.5–5.0)
Alkaline Phosphatase: 78 U/L (ref 38–126)
Anion gap: 10 (ref 5–15)
BUN: 40 mg/dL — ABNORMAL HIGH (ref 6–20)
CO2: 22 mmol/L (ref 22–32)
Calcium: 8.4 mg/dL — ABNORMAL LOW (ref 8.9–10.3)
Chloride: 105 mmol/L (ref 98–111)
Creatinine, Ser: 6.62 mg/dL — ABNORMAL HIGH (ref 0.61–1.24)
GFR, Estimated: 9 mL/min — ABNORMAL LOW (ref >60)
Glucose, Bld: 76 mg/dL (ref 70–99)
Potassium: 4.1 mmol/L (ref 3.5–5.1)
Sodium: 137 mmol/L (ref 135–145)
Total Bilirubin: 0.8 mg/dL (ref 0.3–1.2)
Total Protein: 6.7 g/dL (ref 6.5–8.1)

## 2022-02-26 LAB — RETICULOCYTES
Immature Retic Fract: 40.3 % — ABNORMAL HIGH (ref 2.3–15.9)
RBC.: 3.78 MIL/uL — ABNORMAL LOW (ref 4.22–5.81)
Retic Count, Absolute: 102.8 10*3/uL (ref 19.0–186.0)
Retic Ct Pct: 2.7 % (ref 0.4–3.1)

## 2022-02-26 LAB — LACTIC ACID, PLASMA
Lactic Acid, Venous: 0.8 mmol/L (ref 0.5–1.9)
Lactic Acid, Venous: 0.7 mmol/L (ref 0.5–1.9)

## 2022-02-26 LAB — IRON AND TIBC
Iron: 40 ug/dL — ABNORMAL LOW (ref 45–182)
Saturation Ratios: 23 % (ref 17.9–39.5)
TIBC: 172 ug/dL — ABNORMAL LOW (ref 250–450)
UIBC: 132 ug/dL

## 2022-02-26 LAB — FERRITIN: Ferritin: 206 ng/mL (ref 24–336)

## 2022-02-26 LAB — FOLATE: Folate: 6.1 ng/mL (ref >5.9)

## 2022-02-26 MED ORDER — INSULIN ASPART 100 UNIT/ML IJ SOLN: 0.0000 [IU] | INTRAMUSCULAR | Status: DC

## 2022-02-26 MED ORDER — ACETAMINOPHEN 325 MG PO TABS: 650.0000 mg | ORAL_TABLET | Freq: Four times a day (QID) | ORAL | Status: DC | PRN

## 2022-02-26 MED ORDER — ACETAMINOPHEN 650 MG RE SUPP: 650.0000 mg | Freq: Four times a day (QID) | RECTAL | Status: DC | PRN

## 2022-02-26 MED ORDER — CALCIUM ACETATE (PHOS BINDER) 667 MG PO CAPS
1334.0000 mg | ORAL_CAPSULE | Freq: Three times a day (TID) | ORAL | Status: DC
  Administered 2022-02-27 – 2022-02-28 (×3): 1334 mg via ORAL
  Filled 2022-02-26 (×5): qty 2

## 2022-02-26 MED ORDER — IRBESARTAN 150 MG PO TABS
300.0000 mg | ORAL_TABLET | Freq: Every day | ORAL | Status: DC
  Administered 2022-02-27 – 2022-02-28 (×2): 300 mg via ORAL
  Filled 2022-02-26 (×2): qty 2

## 2022-02-26 MED ORDER — HYDROCODONE-ACETAMINOPHEN 5-325 MG PO TABS
1.0000 | ORAL_TABLET | ORAL | Status: DC | PRN
  Filled 2022-02-26: qty 1

## 2022-02-26 MED ORDER — CARVEDILOL 25 MG PO TABS
25.0000 mg | ORAL_TABLET | Freq: Two times a day (BID) | ORAL | Status: DC
  Administered 2022-02-27 – 2022-02-28 (×3): 25 mg via ORAL
  Filled 2022-02-26 (×2): qty 1
  Filled 2022-02-26: qty 4

## 2022-02-26 MED ORDER — VANCOMYCIN HCL 1500 MG/300ML IV SOLN
1500.0000 mg | Freq: Once | INTRAVENOUS | Status: AC
  Administered 2022-02-26: 1500 mg via INTRAVENOUS
  Filled 2022-02-26: qty 300

## 2022-02-26 MED ORDER — OXYCODONE-ACETAMINOPHEN 5-325 MG PO TABS: 1.0000 | ORAL_TABLET | ORAL | Status: DC | PRN

## 2022-02-26 MED ORDER — MORPHINE SULFATE (PF) 2 MG/ML IV SOLN: 2.0000 mg | INTRAVENOUS | Status: DC | PRN

## 2022-02-26 MED ORDER — SODIUM CHLORIDE 0.9 % IV SOLN
1.0000 g | INTRAVENOUS | Status: DC
  Administered 2022-02-27: 1 g via INTRAVENOUS
  Filled 2022-02-26 (×2): qty 10

## 2022-02-26 MED ORDER — LEVETIRACETAM 750 MG PO TABS
750.0000 mg | ORAL_TABLET | ORAL | Status: DC
  Administered 2022-02-27 (×2): 750 mg via ORAL
  Filled 2022-02-26 (×2): qty 1

## 2022-02-26 MED ORDER — LEVETIRACETAM 750 MG PO TABS
750.0000 mg | ORAL_TABLET | ORAL | Status: DC
  Administered 2022-02-28: 750 mg via ORAL
  Filled 2022-02-26: qty 1

## 2022-02-26 MED ORDER — IOHEXOL 350 MG/ML SOLN
125.0000 mL | Freq: Once | INTRAVENOUS | Status: AC | PRN
  Administered 2022-02-26: 125 mL via INTRAVENOUS

## 2022-02-26 MED ORDER — SODIUM CHLORIDE 0.9 % IV SOLN
2.0000 g | Freq: Once | INTRAVENOUS | Status: AC
  Administered 2022-02-26: 2 g via INTRAVENOUS
  Filled 2022-02-26: qty 12.5

## 2022-02-26 MED ORDER — VANCOMYCIN HCL IN DEXTROSE 1-5 GM/200ML-% IV SOLN: 1000.0000 mg | Freq: Once | INTRAVENOUS | Status: DC

## 2022-02-26 MED ORDER — LACTATED RINGERS IV SOLN: INTRAVENOUS | Status: AC

## 2022-02-26 NOTE — ED Provider Notes (Signed)
[facility] Provider Note   Event Date/Time   First MD Initiated Contact with Patient 02/26/22 1554     (approximate) History  Wound Infection  HPI [name] Kenneith Stief. is a 52 y.o. male with a past medical history of end-stage renal disease on dialysis Tuesday/Thursday/Saturday as well as nonhealing ulcerative wound over the right fifth toe who presents at the request of his podiatrist with concerns for osteomyelitis in this toe and need for likely amputation.  Patient states that pain in this toe has been slightly worsened over the past week.  Patient denies any spreading erythema up his foot. ROS: Patient currently denies any vision changes, tinnitus, difficulty speaking, facial droop, sore throat, chest pain, shortness of breath, abdominal pain, nausea/vomiting/diarrhea, dysuria, or weakness/numbness/paresthesias in any extremity   Physical Exam  Triage Vital Signs: ED Triage Vitals [02/26/22 1442]  Enc Vitals Group     BP (!) 135/48     Pulse Rate (!) 57     Resp 18     Temp 97.9 F (36.6 C)     Temp Source Oral     SpO2 99 %     Weight      Height      Head Circumference      Peak Flow      Pain Score 7     Pain Loc      Pain Edu?      Excl. in [place]?    Most recent vital signs: Vitals:   02/26/22 1442  BP: (!) 135/48  Pulse: (!) 57  Resp: 18  Temp: 97.9 F (36.6 C)  SpO2: 99%   General: Awake, oriented x4. CV:  Good peripheral perfusion.  Resp:  Normal effort.  Abd:  No distention.  Other:  Middle-aged overweight African-American male laying in bed in no acute distress.  Right fifth toe shows eschar with surrounding erythema and ED Results / Procedures / Treatments  Labs (all labs ordered are listed, but only abnormal results are displayed) Labs Reviewed  COMPREHENSIVE METABOLIC PANEL - Abnormal; Notable for the following components:      Result Value   BUN 40 (*)    Creatinine, Ser 6.62 (*)    Calcium 8.4 (*)    Albumin 3.3 (*)     AST 13 (*)    GFR, Estimated 9 (*)    All other components within normal limits  CBC WITH DIFFERENTIAL/PLATELET - Abnormal; Notable for the following components:   RBC 3.80 (*)    Hemoglobin 7.8 (*)    HCT 27.4 (*)    MCV 72.1 (*)    MCH 20.5 (*)    MCHC 28.5 (*)    RDW 28.1 (*)    nRBC 0.5 (*)    All other components within normal limits  CULTURE, BLOOD (ROUTINE X 2)  CULTURE, BLOOD (ROUTINE X 2)  LACTIC ACID, PLASMA  LACTIC ACID, PLASMA  URINALYSIS, ROUTINE W REFLEX MICROSCOPIC  RADIOLOGY ED MD interpretation: CT angio aortobifemoral with runoff interpreted independently by me and shows no significant aortoiliac or femoral-popliteal occlusive disease.  X-ray of the right foot independently interpreted by me shows signs of acute osteomyelitis involving the head of the fifth proximal phalanx fifth middle phalanx and fifth distal phalanx with diffuse soft tissue edema and extensive vascular calcifications -Agree with radiology assessment Official radiology report(s): CT Angio Aortobifemoral W and/or Wo Contrast  Result Date: 02/26/2022 CLINICAL DATA:  Peripheral arterial disease, osteomyelitis EXAM: CT ANGIOGRAPHY OF ABDOMINAL AORTA  WITH ILIOFEMORAL RUNOFF TECHNIQUE: Multidetector CT imaging of the abdomen, pelvis and lower extremities was performed using the standard protocol during bolus administration of intravenous contrast. Multiplanar CT image reconstructions and MIPs were obtained to evaluate the vascular anatomy. RADIATION DOSE REDUCTION: This exam was performed according to the departmental dose-optimization program which includes automated exposure control, adjustment of the mA and/or kV according to patient size and/or use of iterative reconstruction technique. CONTRAST:  145mL OMNIPAQUE IOHEXOL 350 MG/ML SOLN COMPARISON:  07/18/2017 FINDINGS: VASCULAR Coronary calcifications. Aorta: Minimal calcified atheromatous plaque. No aneurysm, dissection, or stenosis. Celiac: Widely  patent. Left gastric artery arises directly from the aorta, an anatomic variant SMA: Patent without evidence of aneurysm, dissection, vasculitis or significant stenosis. Renals: Both renal arteries are patent without evidence of aneurysm, dissection, vasculitis, fibromuscular dysplasia or significant stenosis. IMA: Patent without evidence of aneurysm, dissection, vasculitis or significant stenosis. RIGHT Lower Extremity Inflow: Common iliac: Scattered calcified plaque; no aneurysm, dissection, or significant stenosis. Internal iliac: Mildly atheromatous, patent. External iliac: Patent. Outflow: Common femoral: Patent. Deep femoral branches patent. SFA scattered calcified plaque without stenosis. Popliteal moderate calcified plaque below the knee, without significant stenosis. Runoff: Evaluation limited due to poor scan timing. There is heavy medial calcification. LEFT Lower Extremity Inflow: Common iliac minimally atheromatous, patent. Internal iliac mildly atheromatous, patent External iliac patent. Outflow: Common femoral patent. Deep femoral branches patent. SFA scattered calcified plaque, no stenosis or occlusion. Popliteal moderate calcified plaque without significant stenosis. Runoff: Limited due to poor arterial opacification, and heavy medial calcification in trifurcation runoff vessels. Veins: Limited venous opacification on this arterial phase study. Curvilinear calcifications in bilateral popliteal and femoral veins suggesting stigmata of previous thrombosis. Patent splenic and portal veins. Review of the MIP images confirms the above findings. NON-VASCULAR Lower chest: Moderate pericardial effusion. No pleural effusion. Minimal atelectasis in the visualized lung bases. Hepatobiliary: No focal liver abnormality is seen. No gallstones, gallbladder wall thickening, or biliary dilatation. Pancreas: Unremarkable. No pancreatic ductal dilatation or surrounding inflammatory changes. Spleen: Normal in size  without focal abnormality. Adrenals/Urinary Tract: No adrenal mass. Symmetric renal enhancement without focal lesion or hydronephrosis. Nondistended urinary bladder with wall thickening. Stomach/Bowel: Stomach is incompletely distended, unremarkable. Small bowel decompressed. Normal appendix. The colon is incompletely distended. Circumferential wall thickening in the rectum with mild regional inflammatory/edematous change. Lymphatic: No abdominal or pelvic adenopathy. Reproductive: Prostate unremarkable. Other: No ascites.  No free air. Musculoskeletal: Cortical destruction involving proximal and middle phalanges of the right fifth toe of uncertain chronicity. 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 Subchondral destructive changes at the left second MTP joint. IMPRESSION: 1. No significant aortoiliac or femoropopliteal occlusive disease. 2. Limited evaluation of bilateral tibial runoff due to poor arterial opacification and heavy medial calcification. 3. Moderate pericardial effusion. 4. Wall thickening in the rectum suggesting proctitis. 5. Destructive changes in the right fifth toe and left second MTP joint of uncertain chronicity. 6. Coronary and aortic Atherosclerosis (ICD10-I70.0). Electronically Signed   By: Lucrezia Europe M.D.   On: 02/26/2022 19:02   DG Foot Complete Right  Result Date: 02/26/2022 CLINICAL DATA:  Chronic wound infection.  Concern for osteomyelitis. EXAM: RIGHT FOOT COMPLETE - 3+ VIEW COMPARISON:  02/26/2022, earlier today. FINDINGS: There is diffuse soft tissue edema. Extensive vascular calcifications identified. Signs of bony destruction involving the head of the fifth proximal phalanx, fifth middle phalanx and fifth distal phalanx. Imaging findings are compatible with underlying acute osteomyelitis. No additional areas of bone erosion identified. No acute fracture.  IMPRESSION: 1. Signs of acute osteomyelitis involving the head of the fifth proximal phalanx, fifth middle phalanx and  fifth distal phalanx. 2. Diffuse soft tissue edema. 3. Extensive vascular calcifications. Electronically Signed   By: Kerby Moors M.D.   On: 02/26/2022 18:58   PROCEDURES: Critical Care performed: Yes, see critical care procedure note(s) .1-3 Lead EKG Interpretation  Performed by: [name] Plummer, MD Authorized by: [name] Plummer, MD     Interpretation: normal     ECG rate:  71   ECG rate assessment: normal     Rhythm: sinus rhythm     Ectopy: none     Conduction: normal   CRITICAL CARE Performed by: [name]  Total critical care time: 31 minutes  Critical care time was exclusive of separately billable procedures and treating other patients.  Critical care was necessary to treat or prevent imminent or life-threatening deterioration.  Critical care was time spent personally by me on the following activities: development of treatment plan with patient and/or surrogate as well as nursing, discussions with consultants, evaluation of patient's response to treatment, examination of patient, obtaining history from patient or surrogate, ordering and performing treatments and interventions, ordering and review of laboratory studies, ordering and review of radiographic studies, pulse oximetry and re-evaluation of patient's condition.  MEDICATIONS ORDERED IN ED: Medications  lactated ringers infusion ( Intravenous New Bag/Given 02/26/22 1726)  ceFEPIme (MAXIPIME) 2 g in sodium chloride 0.9 % 100 mL IVPB (0 g Intravenous Stopped 02/26/22 1726)  vancomycin (VANCOREADY) IVPB 1500 mg/300 mL (1,500 mg Intravenous New Bag/Given 02/26/22 1728)  iohexol (OMNIPAQUE) 350 MG/ML injection 125 mL (125 mLs Intravenous Contrast Given 02/26/22 1757)   IMPRESSION / MDM / ASSESSMENT AND PLAN / ED COURSE  I reviewed the triage vital signs and the nursing notes.                             The patient is on the cardiac monitor to evaluate for evidence of arrhythmia and/or significant heart rate  changes. Patient's presentation is most consistent with acute presentation with potential threat to life or bodily function. Patient is a 53yo male who presents for possible bony infection of of the lower extremity Given history, exam, and workup I have low suspicion for necrotizing fasciitis, abscess, DVT, or other emergent problem as a cause for this presentation  Interventions: Vancomycin, cefepime Consults: Hospitalist, podiatry Dispo: Admit   FINAL CLINICAL IMPRESSION(S) / ED DIAGNOSES   Final diagnoses:  Osteomyelitis of fifth toe of right foot ([place])   Rx / DC Orders   ED Discharge Orders     None      Note:  This document was prepared using Dragon voice recognition software and may include unintentional dictation errors.   [name] Plummer, MD 02/26/22 [phone]

## 2022-02-26 NOTE — Progress Notes (Signed)
02/26/22 Elink will follow per sepsis protocol.

## 2022-02-26 NOTE — Assessment & Plan Note (Signed)
Patient appears to be off DOAC and antiplatelets at this time

## 2022-02-26 NOTE — Assessment & Plan Note (Signed)
Continue Keppra.

## 2022-02-26 NOTE — Assessment & Plan Note (Signed)
No acute issues.

## 2022-02-26 NOTE — Progress Notes (Signed)
PHARMACY -  BRIEF ANTIBIOTIC NOTE   Pharmacy has received consult(s) for cefepime, vancomycin from an ED provider.  The patient's profile has been reviewed for ht/wt/allergies/indication/available labs.    One time order(s) placed for cefepime 2 gm IV x 1 and Vancomycin 1500 mg IV x 1  Further antibiotics/pharmacy consults should be ordered by admitting physician if indicated.                       Thank you, Alison Murray 02/26/2022  4:24 PM

## 2022-02-26 NOTE — Assessment & Plan Note (Signed)
Nephrology consult for continuation of dialysis 

## 2022-02-26 NOTE — H&P (Signed)
History and Physical    Patient: [name]. HBZ:[id number] DOB: December 19, 1970 DOA: 02/26/2022 DOS: the patient was seen and examined on 02/26/2022 PCP: [name], MD  Patient coming from: Home  Chief Complaint:  Chief Complaint  Patient presents with   Wound Infection    HPI: [name]. is a 52 y.o. male with medical history significant for ESRD on HD TTS, PAD and osteomyelitis requiring finger amputation on 01/2021, multiple prior CVAs, intraventricular hemorrhage 10/2021 in the setting of DAPT and DOAC and hypertensive emergency, CAD with history of STEMI, HFpEF, permanent A-fib not, on amiodarone, not on anticoag  due to history of brain bleed,  chronic anemia, left eye enucleation, history of seizure, who was sent for admission by podiatry, Dr Jacqualyn Posey for acute osteomyelitis of the right fifth toe that will need amputation.  Patient has had no fever or chills wound follow-up He denies chest pain or shortness of breath and has otherwise been in his usual state of health. ED course and data review: BP 135/48 with pulse 57 and otherwise normal vitals.  Blood work at baseline except for hemoglobin which was 7.8, down from 9.3 on 01/30/2022.  WBC and lactic acid were normal. Foot x-ray showed acute osteomyelitis right fifth toe as detailed below: IMPRESSION: 1. Signs of acute osteomyelitis involving the head of the fifth proximal phalanx, fifth middle phalanx and fifth distal phalanx. 2. Diffuse soft tissue edema. 3. Extensive vascular calcifications.  CT angio aortobifemoral with and without contrast showed the following: IMPRESSION: 1. No significant aortoiliac or femoropopliteal occlusive disease. 2. Limited evaluation of bilateral tibial runoff due to poor arterial opacification and heavy medial calcification. 3. Moderate pericardial effusion. 4. Wall thickening in the rectum suggesting proctitis. 5. Destructive changes in the right fifth toe and left second  MTP joint of uncertain chronicity. 6. Coronary and aortic Atherosclerosis (ICD10-I70.0)  Patient started on vancomycin and cefepime and hospitalist consulted for admission.   Review of Systems: As mentioned in the history of present illness. All other systems reviewed and are negative.  Past Medical History:  Diagnosis Date   Anxiety    a.) on BZO (clonazepam) PRN   Aortic dilatation ([place]) 11/25/2020   a.) TTE 11/25/2020: Ao root measured 38 mm. b.) TTE 04/22/2021: Ao root measured 44 mm.   Atrial fibrillation ([place])    a.) CHA2DS2-VASc = 6 (CHF, HTN, CVA x2, prior MI, T2DM). b.) rate/rhythm maintained on oral amiodarone + carvedilol; chronically anticoagulated using dose reduced apixaban + clopidogrel   BPH (benign prostatic hyperplasia)    Cardiac arrest ([place]) 07/26/2021   a.) in setting of hyperkalemia, NSTEMI, and seizure following missed HD; pulseless and apneic --> required 1 round of CPR prior to ROSC   Cellulitis    CHF (congestive heart failure) ([place]) 07/19/2017   a.) TTE 07/19/2017: EF 75%; sev LVH; mild MR/PR/TR; G1DD. b.) TTE 11/25/2020: EF 55-60%; mod LVH; GLS -13.0; mild-mod BAE; triv MR; mild dilated PA; G2DD. c.) TTE 04/22/2021: EF 55-60%; sev LVH; sev LAE, triv MR; G1DD.   Chronic diarrhea    Chronic pain of both ankles    Coronary artery disease 02/26/2020   a.) anterior STEMI 02/26/2020 --> LHC/PCI --> EF 65%; 90% mLAD (3.5 x 30 mm Resolyte Onyx DES x 1). b.) NSTEMI 07/26/2021   Diabetes mellitus, type II ([place])    Diabetic neuropathy ([place])    Esophagitis    ESRD (end stage renal disease) on dialysis ([place])    a.) [facility]; [facility]-Sat  Frequent falls    Gait instability    Gastritis    Gastroparesis    GERD (gastroesophageal reflux disease)    H/O enucleation of left eyeball    Heart palpitations    History of kidney stones    HLD (hyperlipidemia)    Hypertension    Insomnia    a.) uses melatonin PRN   Long term current use of anticoagulant    a.) dose reduced  apixaban   Long term current use of antithrombotics/antiplatelets    a.) clopidogrel   Nausea and vomiting in adult    recurrent   NSTEMI (non-ST elevated myocardial infarction) ([place]) 07/26/2021   a.) hyperkalemic from missed HD; seizures; decompensated to cardiac arrest and required CPR x 1 round prior to ROSC.   Seizure ([place])    a.) last 07/26/2021 in setting of missed HD --> hyperkalemic at 6.5 --> pulseless/apneic and required CPR; discharged home on levetiracetam.   ST elevation myocardial infarction (STEMI) of anterior wall ([place]) 02/26/2020   a.) LHC/PCI 02/26/2020 --> EF 65%; LVEDP 11 mmHg; 90% mLAD (3.5 x 20 mm Resolute Onyx DES x 1)   Stroke ([place]) 04/2021   a.) documented as x 3 between 04/2021 and 06/2021   Past Surgical History:  Procedure Laterality Date   A/V FISTULAGRAM Left 08/31/2021   Procedure: A/V Fistulagram;  Surgeon: Algernon Huxley, MD;  Location: [facility] [facility];  Service: Cardiovascular;  Laterality: Left;   A/V FISTULAGRAM Left 01/25/2022   Procedure: A/V Fistulagram;  Surgeon: Algernon Huxley, MD;  Location: [place] [facility];  Service: Cardiovascular;  Laterality: Left;   AMPUTATION Left 03/16/2016   Procedure: AMPUTATION DIGIT LEFT HALLUX;  Surgeon: Trula Slade, DPM;  Location: [facility];  Service: Podiatry;  Laterality: Left;  can start around 5    AMPUTATION Right 02/09/2021   Procedure: AMPUTATION DIGIT;  Surgeon: Sherilyn Cooter, MD;  Location: [place] [facility];  Service: Orthopedics;  Laterality: Right;   ARTHROSCOPIC REPAIR ACL Left    AV FISTULA PLACEMENT Right 05/31/2019   Procedure: Brachiocephalic AV fistula creation;  Surgeon: Algernon Huxley, MD;  Location: [name];  Service: Vascular;  Laterality: Right;   AV FISTULA PLACEMENT Left 08/13/2021   Procedure: ARTERIOVENOUS (AV) FISTULA CREATION (BRACHIALCEPHALIC);  Surgeon: Algernon Huxley, MD;  Location: [name];  Service: Vascular;  Laterality: Left;   COLONOSCOPY WITH PROPOFOL N/A 10/28/2015   Procedure:  COLONOSCOPY WITH PROPOFOL;  Surgeon: Lollie Sails, MD;  Location: [facility];  Service: Endoscopy;  Laterality: N/A;   COLONOSCOPY WITH PROPOFOL N/A 10/29/2015   Procedure: COLONOSCOPY WITH PROPOFOL;  Surgeon: Lollie Sails, MD;  Location: [facility] ENDOSCOPY;  Service: Endoscopy;  Laterality: N/A;   CORONARY/GRAFT ACUTE MI REVASCULARIZATION N/A 02/26/2020   Procedure: Coronary/Graft Acute MI Revascularization;  Surgeon: Jettie Booze, MD;  Location: [place] [facility];  Service: Cardiovascular;  Laterality: N/A;   DIALYSIS/PERMA CATHETER INSERTION Right 04/26/2019   Perm Cath    DIALYSIS/PERMA CATHETER INSERTION N/A 04/26/2019   Procedure: DIALYSIS/PERMA CATHETER INSERTION;  Surgeon: Algernon Huxley, MD;  Location: [place] [facility];  Service: Cardiovascular;  Laterality: N/A;   DIALYSIS/PERMA CATHETER INSERTION N/A 05/25/2021   Procedure: DIALYSIS/PERMA CATHETER INSERTION;  Surgeon: Algernon Huxley, MD;  Location: [place] [facility];  Service: Cardiovascular;  Laterality: N/A;   DIALYSIS/PERMA CATHETER INSERTION N/A 08/31/2021   Procedure: DIALYSIS/PERMA CATHETER INSERTION;  Surgeon: Algernon Huxley, MD;  Location: [place] [facility];  Service: Cardiovascular;  Laterality:  N/A;   DIALYSIS/PERMA CATHETER REMOVAL N/A 08/15/2019   Procedure: DIALYSIS/PERMA CATHETER REMOVAL;  Surgeon: Katha Cabal, MD;  Location: [place] [facility];  Service: Cardiovascular;  Laterality: N/A;   EMBOLIZATION Right 02/06/2021   Procedure: EMBOLIZATION;  Surgeon: Algernon Huxley, MD;  Location: [place] [facility];  Service: Cardiovascular;  Laterality: Right;  Right Upper Extremity Dialysis Access, Permcath Placement   ESOPHAGOGASTRODUODENOSCOPY (EGD) WITH PROPOFOL N/A 12/27/2017   Procedure: ESOPHAGOGASTRODUODENOSCOPY (EGD) WITH PROPOFOL;  Surgeon: Toledo, Benay Pike, MD;  Location: [name] ENDOSCOPY;  Service: Gastroenterology;  Laterality: N/A;   EYE SURGERY     INTRAVASCULAR ULTRASOUND/IVUS N/A  02/26/2020   Procedure: Intravascular Ultrasound/IVUS;  Surgeon: Jettie Booze, MD;  Location: [place] [facility];  Service: Cardiovascular;  Laterality: N/A;   LEFT HEART CATH AND CORONARY ANGIOGRAPHY N/A 02/26/2020   Procedure: LEFT HEART CATH AND CORONARY ANGIOGRAPHY;  Surgeon: Jettie Booze, MD;  Location: [facility];  Service: Cardiovascular;  Laterality: N/A;   PROSTATE SURGERY  2016   TONSILECTOMY/ADENOIDECTOMY WITH MYRINGOTOMY     TONSILLECTOMY     UPPER EXTREMITY ANGIOGRAPHY Right 11/26/2020   Procedure: Upper Extremity Angiography;  Surgeon: Algernon Huxley, MD;  Location: [place] [facility];  Service: Cardiovascular;  Laterality: Right;   UPPER EXTREMITY ANGIOGRAPHY Right 02/05/2021   Procedure: UPPER EXTREMITY ANGIOGRAPHY;  Surgeon: Algernon Huxley, MD;  Location: [place] [facility];  Service: Cardiovascular;  Laterality: Right;   Social History:  reports that he has never smoked. He has never used smokeless tobacco. He reports that he does not drink alcohol and does not use drugs.  Allergies  Allergen Reactions   Promethazine Diarrhea and Other (See Comments)    Muscle cramps   Promethazine Hcl Other (See Comments)    Cramping all over  Other reaction(s): cramp all over   Other     Other reaction(s): Unknown Other reaction(s): Unknown   Malt Other (See Comments)    Cramping    Family History  Problem Relation Age of Onset   CAD Father    Stroke Father    Diabetes Mellitus II Mother    Kidney failure Mother    Schizophrenia Mother     Prior to Admission medications   Medication Sig Start Date End Date Taking? Authorizing Provider  acarbose (PRECOSE) 50 MG tablet Take 50 mg by mouth 3 (three) times daily. 10/20/21   [provider]  acetaminophen (TYLENOL) 325 MG tablet Take 1-2 tablets (325-650 mg total) by mouth every 4 (four) hours as needed for mild pain. 05/04/21   Love, Ivan Anchors, PA-C  amiodarone (PACERONE) 200 MG tablet Take 1  tablet (200 mg total) by mouth every morning. 08/10/21 08/11/22  Swinyer, Lanice Schwab, NP  amLODipine (NORVASC) 10 MG tablet Take 1 tablet (10 mg total) by mouth daily. 12/03/21   Bonnielee Haff, MD  atorvastatin (LIPITOR) 80 MG tablet Take 1 tablet (80 mg total) by mouth daily. Patient not taking: Reported on 01/25/2022 05/04/21 04/29/22  Love, Ivan Anchors, PA-C  calcium acetate (PHOSLO) 667 MG capsule Take 2 capsules (1,334 mg total) by mouth 3 (three) times daily with meals. 05/04/21   Love, Ivan Anchors, PA-C  carvedilol (COREG) 25 MG tablet Take 25 mg by mouth 2 (two) times daily. 06/16/21   [provider]  cholestyramine Lucrezia Starch) 4 GM/DOSE powder Take 4 g by mouth 2 (two) times daily. 10/20/21   [provider]  cloNIDine (CATAPRES) 0.3 MG tablet Take 1  tablet (0.3 mg total) by mouth every 8 (eight) hours. Patient taking differently: Take 0.3 mg by mouth every 8 (eight) hours. Only if BP over 200 12/03/21   Bonnielee Haff, MD  dorzolamide-timolol (COSOPT) 22.3-6.8 MG/ML ophthalmic solution Place 1 drop into the right eye 2 (two) times daily.    [provider]  hydrocortisone (ANUSOL-HC) 2.5 % rectal cream Place 1 Application rectally 2 (two) times daily. 04/06/21   [provider]  hydrocortisone cream 1 %  05/04/21   [provider]  irbesartan (AVAPRO) 300 MG tablet Take 1 tablet (300 mg total) by mouth daily. 12/03/21   Bonnielee Haff, MD  levETIRAcetam (KEPPRA) 250 MG tablet Take 1 tablet (250 mg total) by mouth Every Tuesday,Thursday,and Saturday with dialysis. 07/30/21   Nita Sells, MD  levETIRAcetam (KEPPRA) 750 MG tablet Take 1 tablet (750 mg total) by mouth 2 (two) times daily. Patient taking differently: Take 750 mg by mouth 2 (two) times daily. Twice daily Only on dialysis days and once on all other days 12/05/21   Bonnielee Haff, MD  lidocaine-prilocaine (EMLA) cream Apply topically. 12/24/21   [provider]  nitroGLYCERIN  (NITROSTAT) 0.4 MG SL tablet Place 1 tablet (0.4 mg total) under the tongue every 5 (five) minutes as needed for chest pain. Patient not taking: Reported on 02/25/2022 12/09/20 01/09/22  Murlean Iba, MD  omeprazole (PRILOSEC) 40 MG capsule Take 40 mg by mouth as needed.    [provider]  ondansetron (ZOFRAN-ODT) 4 MG disintegrating tablet Take 4 mg by mouth every 12 (twelve) hours as needed for vomiting or nausea.    [provider]  oxyCODONE-acetaminophen (PERCOCET/ROXICET) 5-325 MG tablet Take 1 tablet by mouth every 4 (four) hours as needed for severe pain. 01/20/22   Kris Hartmann, NP  tamsulosin (FLOMAX) 0.4 MG CAPS capsule Take 0.4 mg by mouth daily. Patient not taking: Reported on 01/09/2022 05/19/21   [provider]    Physical Exam: Vitals:   02/26/22 1442  BP: (!) 135/48  Pulse: (!) 57  Resp: 18  Temp: 97.9 F (36.6 C)  TempSrc: Oral  SpO2: 99%   Physical Exam Vitals and nursing note reviewed.  Constitutional:      General: He is not in acute distress. HENT:     Head: Normocephalic and atraumatic.  Cardiovascular:     Rate and Rhythm: Normal rate and regular rhythm.     Heart sounds: Normal heart sounds.  Pulmonary:     Effort: Pulmonary effort is normal.     Breath sounds: Normal breath sounds.  Abdominal:     Palpations: Abdomen is soft.     Tenderness: There is no abdominal tenderness.  Neurological:     Mental Status: Mental status is at baseline.          Labs on Admission: I have personally reviewed following labs and imaging studies  CBC: Recent Labs  Lab 02/26/22 1445  WBC 6.5  NEUTROABS 3.5  HGB 7.8*  HCT 27.4*  MCV 72.1*  PLT 166   Basic Metabolic Panel: Recent Labs  Lab 02/26/22 1445  NA 137  K 4.1  CL 105  CO2 22  GLUCOSE 76  BUN 40*  CREATININE 6.62*  CALCIUM 8.4*   GFR: CrCl cannot be calculated (Unknown ideal weight.). Liver Function Tests: Recent Labs  Lab 02/26/22 1445  AST  13*  ALT 12  ALKPHOS 78  BILITOT 0.8  PROT 6.7  ALBUMIN 3.3*   No results for  input(s): "LIPASE", "AMYLASE" in the last 168 hours. No results for input(s): "AMMONIA" in the last 168 hours. Coagulation Profile: No results for input(s): "INR", "PROTIME" in the last 168 hours. Cardiac Enzymes: No results for input(s): "CKTOTAL", "CKMB", "CKMBINDEX", "TROPONINI" in the last 168 hours. BNP (last 3 results) No results for input(s): "PROBNP" in the last 8760 hours. HbA1C: No results for input(s): "HGBA1C" in the last 72 hours. CBG: No results for input(s): "GLUCAP" in the last 168 hours. Lipid Profile: No results for input(s): "CHOL", "HDL", "LDLCALC", "TRIG", "CHOLHDL", "LDLDIRECT" in the last 72 hours. Thyroid Function Tests: No results for input(s): "TSH", "T4TOTAL", "FREET4", "T3FREE", "THYROIDAB" in the last 72 hours. Anemia Panel: No results for input(s): "VITAMINB12", "FOLATE", "FERRITIN", "TIBC", "IRON", "RETICCTPCT" in the last 72 hours. Urine analysis:    Component Value Date/Time   COLORURINE YELLOW (A) 11/07/2017 1855   APPEARANCEUR CLOUDY (A) 11/07/2017 1855   LABSPEC 1.016 11/07/2017 1855   PHURINE 5.0 11/07/2017 1855   GLUCOSEU >=500 (A) 11/07/2017 1855   HGBUR MODERATE (A) 11/07/2017 1855   BILIRUBINUR NEGATIVE 11/07/2017 1855   KETONESUR NEGATIVE 11/07/2017 1855   PROTEINUR >=300 (A) 11/07/2017 1855   NITRITE NEGATIVE 11/07/2017 1855   LEUKOCYTESUR NEGATIVE 11/07/2017 1855    Radiological Exams on Admission: CT Angio Aortobifemoral W and/or Wo Contrast  Result Date: 02/26/2022 CLINICAL DATA:  Peripheral arterial disease, osteomyelitis EXAM: CT ANGIOGRAPHY OF ABDOMINAL AORTA WITH ILIOFEMORAL RUNOFF TECHNIQUE: Multidetector CT imaging of the abdomen, pelvis and lower extremities was performed using the standard protocol during bolus administration of intravenous contrast. Multiplanar CT image reconstructions and MIPs were obtained to evaluate the vascular anatomy.  RADIATION DOSE REDUCTION: This exam was performed according to the departmental dose-optimization program which includes automated exposure control, adjustment of the mA and/or kV according to patient size and/or use of iterative reconstruction technique. CONTRAST:  140mL OMNIPAQUE IOHEXOL 350 MG/ML SOLN COMPARISON:  07/18/2017 FINDINGS: VASCULAR Coronary calcifications. Aorta: Minimal calcified atheromatous plaque. No aneurysm, dissection, or stenosis. Celiac: Widely patent. Left gastric artery arises directly from the aorta, an anatomic variant SMA: Patent without evidence of aneurysm, dissection, vasculitis or significant stenosis. Renals: Both renal arteries are patent without evidence of aneurysm, dissection, vasculitis, fibromuscular dysplasia or significant stenosis. IMA: Patent without evidence of aneurysm, dissection, vasculitis or significant stenosis. RIGHT Lower Extremity Inflow: Common iliac: Scattered calcified plaque; no aneurysm, dissection, or significant stenosis. Internal iliac: Mildly atheromatous, patent. External iliac: Patent. Outflow: Common femoral: Patent. Deep femoral branches patent. SFA scattered calcified plaque without stenosis. Popliteal moderate calcified plaque below the knee, without significant stenosis. Runoff: Evaluation limited due to poor scan timing. There is heavy medial calcification. LEFT Lower Extremity Inflow: Common iliac minimally atheromatous, patent. Internal iliac mildly atheromatous, patent External iliac patent. Outflow: Common femoral patent. Deep femoral branches patent. SFA scattered calcified plaque, no stenosis or occlusion. Popliteal moderate calcified plaque without significant stenosis. Runoff: Limited due to poor arterial opacification, and heavy medial calcification in trifurcation runoff vessels. Veins: Limited venous opacification on this arterial phase study. Curvilinear calcifications in bilateral popliteal and femoral veins suggesting stigmata of  previous thrombosis. Patent splenic and portal veins. Review of the MIP images confirms the above findings. NON-VASCULAR Lower chest: Moderate pericardial effusion. No pleural effusion. Minimal atelectasis in the visualized lung bases. Hepatobiliary: No focal liver abnormality is seen. No gallstones, gallbladder wall thickening, or biliary dilatation. Pancreas: Unremarkable. No pancreatic ductal dilatation or surrounding inflammatory changes. Spleen: Normal in size without focal abnormality. Adrenals/Urinary Tract: No adrenal mass.  Symmetric renal enhancement without focal lesion or hydronephrosis. Nondistended urinary bladder with wall thickening. Stomach/Bowel: Stomach is incompletely distended, unremarkable. Small bowel decompressed. Normal appendix. The colon is incompletely distended. Circumferential wall thickening in the rectum with mild regional inflammatory/edematous change. Lymphatic: No abdominal or pelvic adenopathy. Reproductive: Prostate unremarkable. Other: No ascites.  No free air. Musculoskeletal: Cortical destruction involving proximal and middle phalanges of the right fifth toe of uncertain chronicity. 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 Subchondral destructive changes at the left second MTP joint. IMPRESSION: 1. No significant aortoiliac or femoropopliteal occlusive disease. 2. Limited evaluation of bilateral tibial runoff due to poor arterial opacification and heavy medial calcification. 3. Moderate pericardial effusion. 4. Wall thickening in the rectum suggesting proctitis. 5. Destructive changes in the right fifth toe and left second MTP joint of uncertain chronicity. 6. Coronary and aortic Atherosclerosis (ICD10-I70.0). Electronically Signed   By: Lucrezia Europe M.D.   On: 02/26/2022 19:02   DG Foot Complete Right  Result Date: 02/26/2022 CLINICAL DATA:  Chronic wound infection.  Concern for osteomyelitis. EXAM: RIGHT FOOT COMPLETE - 3+ VIEW COMPARISON:  02/26/2022, earlier today.  FINDINGS: There is diffuse soft tissue edema. Extensive vascular calcifications identified. Signs of bony destruction involving the head of the fifth proximal phalanx, fifth middle phalanx and fifth distal phalanx. Imaging findings are compatible with underlying acute osteomyelitis. No additional areas of bone erosion identified. No acute fracture. IMPRESSION: 1. Signs of acute osteomyelitis involving the head of the fifth proximal phalanx, fifth middle phalanx and fifth distal phalanx. 2. Diffuse soft tissue edema. 3. Extensive vascular calcifications. Electronically Signed   By: Kerby Moors M.D.   On: 02/26/2022 18:58     Data Reviewed: Relevant notes from primary care and specialist visits, past discharge summaries as available in EHR, including Care Everywhere. Prior diagnostic testing as pertinent to current admission diagnoses Updated medications and problem lists for reconciliation ED course, including vitals, labs, imaging, treatment and response to treatment Triage notes, nursing and pharmacy notes and ED provider's notes Notable results as noted in HPI   Assessment and Plan: * Acute osteomyelitis of toe of right foot with gangrene (HCC) PVD Continue cefepime and vancomycin started in the ED Foot x-ray showing osteomyelitis, acute of right fifth toe.  CTA showing no occlusion Will get vascular consult Podiatry consult N.p.o. from midnight anticipated amputation  Preoperative clearance Will get cardiology consult for clearance given extensive history  Acute on chronic anemia Hemoglobin 7.8, down from baseline of 9.3 Acute bleeding not suspected at this time patient denies melena, hematemesis Continue to monitor Will type and screen and transfuse if under 7 Anemia panel  Paroxysmal atrial fibrillation (HCC) Continue amiodarone and carvedilol No longer on systemic anticoagulation, likely secondary to intraventricular bleed in September 2023  CAD/ H/o prior STEMI 02/2020 s/p  DES to LAD Patient denies chest pain Will get baseline EKG and troponin as part of clearance for upcoming surgery Continue nitroglycerin, carvedilol, atorvastatin, irbesartan  Chronic Grade 2 diastolic heart failure/EF 55 to 60% Clinically euvolemic Continue carvedilol, irbesartan  ESRD on hemodialysis [facility]) Nephrology consult for continuation of dialysis  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  No antiplatelets seen on med list  Essential hypertension, benign BP controlled.  Continue carvedilol and irbesartan.  Holding clonidine and amlodipine  Diabetes mellitus, type II (HCC) Sliding scale insulin coverage  History of seizure Continue Keppra  H/O enucleation of left eyeball No acute issues        DVT prophylaxis: SCD  Consults: Dr Joylene John, renal and Dr Blenda Mounts , podiatry, Dr Shelia Media, vascular, [facility] cardiology Dr. Harrington Challenger  Advance Care Planning:   Code Status: Prior   Family Communication: none  Disposition Plan: Back to previous home environment  Severity of Illness: The appropriate patient status for this patient is INPATIENT. Inpatient status is judged to be reasonable and necessary in order to provide the required intensity of service to ensure the patient's safety. The patient's presenting symptoms, physical exam findings, and initial radiographic and laboratory data in the context of their chronic comorbidities is felt to place them at high risk for further clinical deterioration. Furthermore, it is not anticipated that the patient will be medically stable for discharge from the hospital within 2 midnights of admission.   * I certify that at the point of admission it is my clinical judgment that the patient will require inpatient hospital care spanning beyond 2 midnights from the point of admission due to high  intensity of service, high risk for further deterioration and high frequency of surveillance required.*  Author: Athena Masse, MD 02/26/2022 8:45 PM  For on call review [email or web address].

## 2022-02-26 NOTE — Assessment & Plan Note (Signed)
Continue atorvastatin.  No antiplatelets seen on med list

## 2022-02-26 NOTE — Assessment & Plan Note (Signed)
Clinically euvolemic Continue carvedilol, irbesartan

## 2022-02-26 NOTE — Assessment & Plan Note (Signed)
BP controlled.  Continue carvedilol and irbesartan.  Holding clonidine and amlodipine

## 2022-02-26 NOTE — Assessment & Plan Note (Signed)
Hemoglobin 7.8, down from baseline of 9.3 Acute bleeding not suspected at this time patient denies melena, hematemesis Continue to monitor Will type and screen and transfuse if under 7 Anemia panel

## 2022-02-26 NOTE — ED Triage Notes (Signed)
Pt to ED via POV from home. Pt was sent by [name] for evaluation of right foot infection. Note reads, "Given osteomyelitis, PAD recommend admission to the hospital for ABI, vascular work up and will need amputation of the 5th toe at least."

## 2022-02-26 NOTE — Assessment & Plan Note (Addendum)
PVD Continue cefepime and vancomycin started in the ED Foot x-ray showing osteomyelitis, acute of right fifth toe.  CTA showing no occlusion Will get vascular consult Podiatry consult N.p.o. from midnight anticipated amputation

## 2022-02-26 NOTE — Assessment & Plan Note (Signed)
Patient denies chest pain Will get baseline EKG and troponin as part of clearance for upcoming surgery Continue nitroglycerin, carvedilol, atorvastatin, irbesartan

## 2022-02-26 NOTE — Progress Notes (Unsigned)
Subjective:   Patient ID: [name]., male   DOB: 52 y.o.   MRN: [id number]   HPI Chief Complaint  Patient presents with   Diabetic Ulcer    Diabetic ulcer 5th toe, A1c- 4.9, started 3 months ago, drainage    He has had a nurse come out and look at it. He has been using neosprin and a spay. He gets some clear drainage. No fevers, chills.    ROS      Objective:  Physical Exam  General: AAO x3, NAD  Dermatological:          Vascular:   Neruologic: Sensation decreased  Musculoskeletal:   Gait: Unassisted, Nonantalgic.       Assessment:  ***     Plan:  ***    -Given osteomyelitis, PAD recommend admission to the hospital for ABI, vascular work up and will need amputation of the 5th toe at least.

## 2022-02-26 NOTE — Progress Notes (Signed)
CODE SEPSIS - PHARMACY COMMUNICATION  **Broad Spectrum Antibiotics should be administered within 1 hour of Sepsis diagnosis**  Time Code Sepsis Called/Page Received: 1607  Antibiotics Ordered: cefepime, vancomycin  Time of 1st antibiotic administration: 1723  Additional action taken by pharmacy: N/A    Alison Murray ,PharmD Clinical Pharmacist  02/26/2022  5:33 PM

## 2022-02-26 NOTE — ED Notes (Signed)
Patient refused glucose check.

## 2022-02-26 NOTE — Assessment & Plan Note (Signed)
Sliding scale insulin coverage 

## 2022-02-26 NOTE — Assessment & Plan Note (Signed)
Continue amiodarone and carvedilol No longer on systemic anticoagulation, likely secondary to intraventricular bleed in September 2023

## 2022-02-27 ENCOUNTER — Encounter: Payer: Self-pay | Admitting: Internal Medicine

## 2022-02-27 DIAGNOSIS — Z01818 Encounter for other preprocedural examination: Secondary | ICD-10-CM

## 2022-02-27 DIAGNOSIS — M86171 Other acute osteomyelitis, right ankle and foot: Secondary | ICD-10-CM | POA: Diagnosis not present

## 2022-02-27 DIAGNOSIS — I3139 Other pericardial effusion (noninflammatory): Secondary | ICD-10-CM

## 2022-02-27 DIAGNOSIS — I5032 Chronic diastolic (congestive) heart failure: Secondary | ICD-10-CM | POA: Diagnosis not present

## 2022-02-27 DIAGNOSIS — I739 Peripheral vascular disease, unspecified: Secondary | ICD-10-CM

## 2022-02-27 DIAGNOSIS — I2510 Atherosclerotic heart disease of native coronary artery without angina pectoris: Secondary | ICD-10-CM | POA: Diagnosis not present

## 2022-02-27 DIAGNOSIS — N186 End stage renal disease: Secondary | ICD-10-CM | POA: Diagnosis not present

## 2022-02-27 DIAGNOSIS — I2583 Coronary atherosclerosis due to lipid rich plaque: Secondary | ICD-10-CM

## 2022-02-27 DIAGNOSIS — Z992 Dependence on renal dialysis: Secondary | ICD-10-CM

## 2022-02-27 DIAGNOSIS — M86671 Other chronic osteomyelitis, right ankle and foot: Secondary | ICD-10-CM

## 2022-02-27 DIAGNOSIS — I5030 Unspecified diastolic (congestive) heart failure: Secondary | ICD-10-CM

## 2022-02-27 LAB — RENAL FUNCTION PANEL
Albumin: 2.7 g/dL — ABNORMAL LOW (ref 3.5–5.0)
Anion gap: 8 (ref 5–15)
BUN: 44 mg/dL — ABNORMAL HIGH (ref 6–20)
CO2: 22 mmol/L (ref 22–32)
Calcium: 7.9 mg/dL — ABNORMAL LOW (ref 8.9–10.3)
Chloride: 107 mmol/L (ref 98–111)
Creatinine, Ser: 7.62 mg/dL — ABNORMAL HIGH (ref 0.61–1.24)
GFR, Estimated: 8 mL/min — ABNORMAL LOW
Glucose, Bld: 101 mg/dL — ABNORMAL HIGH (ref 70–99)
Phosphorus: 5.2 mg/dL — ABNORMAL HIGH (ref 2.5–4.6)
Potassium: 4.1 mmol/L (ref 3.5–5.1)
Sodium: 137 mmol/L (ref 135–145)

## 2022-02-27 LAB — CBC
HCT: 26.6 % — ABNORMAL LOW (ref 39.0–52.0)
Hemoglobin: 7.7 g/dL — ABNORMAL LOW (ref 13.0–17.0)
MCH: 20.7 pg — ABNORMAL LOW (ref 26.0–34.0)
MCHC: 28.9 g/dL — ABNORMAL LOW (ref 30.0–36.0)
MCV: 71.5 fL — ABNORMAL LOW (ref 80.0–100.0)
Platelets: 137 10*3/uL — ABNORMAL LOW (ref 150–400)
RBC: 3.72 MIL/uL — ABNORMAL LOW (ref 4.22–5.81)
RDW: 28 % — ABNORMAL HIGH (ref 11.5–15.5)
WBC: 5.4 10*3/uL (ref 4.0–10.5)
nRBC: 0 % (ref 0.0–0.2)

## 2022-02-27 LAB — TYPE AND SCREEN
ABO/RH(D): A POS
Antibody Screen: NEGATIVE

## 2022-02-27 LAB — PREALBUMIN: Prealbumin: 22 mg/dL (ref 18–38)

## 2022-02-27 LAB — MAGNESIUM: Magnesium: 1.9 mg/dL (ref 1.7–2.4)

## 2022-02-27 LAB — HEPATITIS B SURFACE ANTIGEN: Hepatitis B Surface Ag: NONREACTIVE

## 2022-02-27 LAB — VITAMIN B12: Vitamin B-12: 344 pg/mL (ref 180–914)

## 2022-02-27 LAB — SEDIMENTATION RATE: Sed Rate: 9 mm/hr (ref 0–20)

## 2022-02-27 LAB — HEMOGLOBIN: Hemoglobin: 8.1 g/dL — ABNORMAL LOW (ref 13.0–17.0)

## 2022-02-27 LAB — C-REACTIVE PROTEIN: CRP: <0.5 mg/dL (ref <1.0)

## 2022-02-27 MED ORDER — HEPARIN SODIUM (PORCINE) 1000 UNIT/ML DIALYSIS: 1000.0000 [IU] | INTRAMUSCULAR | Status: DC | PRN

## 2022-02-27 MED ORDER — LIDOCAINE-PRILOCAINE 2.5-2.5 % EX CREA: 1.0000 | TOPICAL_CREAM | CUTANEOUS | Status: DC | PRN

## 2022-02-27 MED ORDER — CHLORHEXIDINE GLUCONATE CLOTH 2 % EX PADS
6.0000 | MEDICATED_PAD | Freq: Once | CUTANEOUS | Status: AC
  Administered 2022-02-27: 6 via TOPICAL

## 2022-02-27 MED ORDER — CLONIDINE HCL 0.1 MG PO TABS
0.3000 mg | ORAL_TABLET | Freq: Three times a day (TID) | ORAL | Status: DC
  Administered 2022-02-27: 0.3 mg via ORAL
  Filled 2022-02-27 (×2): qty 3

## 2022-02-27 MED ORDER — HYDRALAZINE HCL 20 MG/ML IJ SOLN
10.0000 mg | Freq: Four times a day (QID) | INTRAMUSCULAR | Status: DC | PRN
  Administered 2022-02-27 – 2022-02-28 (×2): 10 mg via INTRAVENOUS
  Filled 2022-02-27 (×2): qty 1

## 2022-02-27 MED ORDER — ANTICOAGULANT SODIUM CITRATE 4% (200MG/5ML) IV SOLN: 5.0000 mL | Status: DC | PRN

## 2022-02-27 MED ORDER — VANCOMYCIN HCL IN DEXTROSE 1-5 GM/200ML-% IV SOLN
1000.0000 mg | INTRAVENOUS | Status: DC
  Administered 2022-02-27: 1000 mg via INTRAVENOUS
  Filled 2022-02-27: qty 200

## 2022-02-27 MED ORDER — LIDOCAINE HCL (PF) 1 % IJ SOLN: 5.0000 mL | INTRAMUSCULAR | Status: DC | PRN

## 2022-02-27 MED ORDER — AMLODIPINE BESYLATE 10 MG PO TABS
10.0000 mg | ORAL_TABLET | Freq: Every day | ORAL | Status: DC
  Administered 2022-02-27 – 2022-02-28 (×2): 10 mg via ORAL
  Filled 2022-02-27 (×2): qty 1

## 2022-02-27 MED ORDER — PENTAFLUOROPROP-TETRAFLUOROETH EX AERO: 1.0000 | INHALATION_SPRAY | CUTANEOUS | Status: DC | PRN

## 2022-02-27 MED ORDER — ATORVASTATIN CALCIUM 20 MG PO TABS
80.0000 mg | ORAL_TABLET | Freq: Every day | ORAL | Status: DC
  Administered 2022-02-27 – 2022-02-28 (×2): 80 mg via ORAL
  Filled 2022-02-27 (×2): qty 4

## 2022-02-27 MED ORDER — DEXTROSE 50 % IV SOLN
INTRAVENOUS | Status: AC
  Filled 2022-02-27: qty 50

## 2022-02-27 MED ORDER — CHLORHEXIDINE GLUCONATE CLOTH 2 % EX PADS
6.0000 | MEDICATED_PAD | Freq: Every day | CUTANEOUS | Status: DC
  Administered 2022-02-28: 6 via TOPICAL
  Filled 2022-02-27: qty 6

## 2022-02-27 MED ORDER — ALTEPLASE 2 MG IJ SOLR: 2.0000 mg | Freq: Once | INTRAMUSCULAR | Status: DC | PRN

## 2022-02-27 MED ORDER — CHLORHEXIDINE GLUCONATE CLOTH 2 % EX PADS
6.0000 | MEDICATED_PAD | Freq: Once | CUTANEOUS | Status: AC
  Administered 2022-02-28: 6 via TOPICAL

## 2022-02-27 NOTE — Inpatient Diabetes Management (Signed)
Inpatient Diabetes Program Recommendations  AACE/ADA: New Consensus Statement on Inpatient Glycemic Control  Target Ranges:  Prepandial:   less than 140 mg/dL      Peak postprandial:   less than 180 mg/dL (1-2 hours)      Critically ill patients:  140 - 180 mg/dL    Latest Reference Range & Units 02/26/22 14:45  Glucose 70 - 99 mg/dL 76    Latest Reference Range & Units 11/21/21 11:05  Hemoglobin A1C 4.8 - 5.6 % 4.5 (L)   Review of Glycemic Control  Diabetes history: DM2 Outpatient Diabetes medications: None Current orders for Inpatient glycemic control: Novolog 0-6 units Q4H  Inpatient Diabetes Program Recommendations:    Insulin: Agree with current insulin orders.  NOTE: Noted consult for Diabetes Coordinator (per lower extremity wound order set). Diabetes Coordinator is not on campus over the weekend but available by pager from 8am to 5pm for questions or concerns. Per H&P on 02/26/22, patient was sent for admission by podiatry, Dr Jacqualyn Posey for acute osteomyelitis of the right fifth toe that will need amputation. Glucose on labs 76 mg/dl at 14:45 on 02/26/22. Per note by M. Ashburn, RN today at 3:12 am, patient refusing CBGs and insulin. Please consider patient to allow nursing staff to check glucose and administer insulin per correction scale if needed.  Thanks, Barnie Alderman, RN, MSN, [place] Diabetes Coordinator Inpatient Diabetes Program [phone] (Team Pager from 8am to [place])

## 2022-02-27 NOTE — Progress Notes (Signed)
Pt completed 3.5 hour HD treatment w/ 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s. Alert, stable, no c/o, report to primary RN.  Start: 1234 End: 1606 205ml fluid removed 84L BVP 85kg post hd bed weight Vanc 218ml with HD

## 2022-02-27 NOTE — Progress Notes (Signed)
Pharmacy Antibiotic Note  [name]. is a 52 y.o. male admitted on 02/26/2022 with  wound infectin/osteomyelitis .  Pharmacy has been consulted for Vancomycin , cefepime dosing.  Pt is on HD Q T-Thu-Sat.    Plan: Cefepime 2 gm IV X 1 given in ED on 1/5 @ 1723. Cefepime 1 gm IV Q24H ordered to start on 1/6 @ 1700.   Vancomycin 1500 mg IV X 1 given in ED on 1/5 @ 1728. Vancomycin 1 gm IV Q T-Th-Sat ordered to start on 1/6 @ 1200.  No trough currently ordered.   Height: 6\' 3"  (190.5 cm) Weight: 81.6 kg (180 lb) IBW/kg (Calculated) : 84.5  Temp (24hrs), Avg:98.6 F (37 C), Min:97.9 F (36.6 C), Max:99.3 F (37.4 C)  Recent Labs  Lab 02/26/22 1445 02/26/22 1722  WBC 6.5  --   CREATININE 6.62*  --   LATICACIDVEN 0.8 0.7    Estimated Creatinine Clearance: 14.7 mL/min (A) (by C-G formula based on SCr of 6.62 mg/dL (H)).    Allergies  Allergen Reactions   Promethazine Diarrhea and Other (See Comments)    Muscle cramps   Promethazine Hcl Other (See Comments)    Cramping all over  Other reaction(s): cramp all over   Other     Other reaction(s): Unknown Other reaction(s): Unknown   Malt Other (See Comments)    Cramping    Antimicrobials this admission:   >>    >>   Dose adjustments this admission:   Microbiology results:  BCx:   UCx:    Sputum:    MRSA PCR:   Thank you for allowing pharmacy to be a part of this patient's care.  Geneen Dieter D 02/27/2022 1:34 AM

## 2022-02-27 NOTE — Progress Notes (Signed)
Post hd rn assessment 

## 2022-02-27 NOTE — ED Notes (Signed)
Pt refusing blood sugars and insulin

## 2022-02-27 NOTE — Consult Note (Signed)
Subjective:  Patient ID: [name]., male    DOB: Sep 02, 1970,  MRN: [id number]  Patient with past medical history of ESRD on HD, PAD and osteomyelitis in finger, CVA, CAD, STEMI, HFpEF seen at beside today for concern of right fith digit wound and osteomyelitis. Was seen in the office by [name] and advised to head to ED. X-rays were concerned for osteomyelitis and history of PAD needing vascular workup Patient relates feeling ok and not too much pain .   Past Medical History:  Diagnosis Date   Anxiety    a.) on BZO (clonazepam) PRN   Aortic dilatation ([place]) 11/25/2020   a.) TTE 11/25/2020: Ao root measured 38 mm. b.) TTE 04/22/2021: Ao root measured 44 mm; c. 11/2021 Echo: Ao root 41mm.   Atrial fibrillation ([place])    a.) CHA2DS2-VASc = 6 (CHF, HTN, CVA x2, prior MI, T2DM). b.) rate/rhythm maintained on oral amiodarone + carvedilol; chronically anticoagulated using dose reduced apixaban + clopidogrel   BPH (benign prostatic hyperplasia)    Cardiac arrest ([place]) 07/26/2021   a.) in setting of hyperkalemia, NSTEMI, and seizure following missed HD; pulseless and apneic --> required 1 round of CPR prior to ROSC   Cellulitis    Cerebral hemorrhage ([place])    a. 10/2021 interventricular hemorrhage in setting of HTN emergency and DOAC/APT-->eliquis/plavix d/c'd.   Chronic diarrhea    Chronic heart failure with preserved ejection fraction (HFpEF) ([place]) 07/19/2017   a. 06/2017 Echo: EF 75%; b. 11/2020 Echo: EF 55-60%; c. 04/2021 Echo: EF 55-60%; d. 11/2021 Echo: EF 65-70%, GrII DD, nl RV fxn, sev dil LA, large circumferential pericardial eff w/o tamponade, triv MR, AoV sclerosis. Ao root 4mm.   Chronic pain of both ankles    Coronary artery disease 02/26/2020   a. 02/2020 Ant STEMI/PCI: LM nl, LAD 63m (3.5x30 Resolute Onyx), RI nl, LCX min irregs, RCA min irregs. EF 65%.   Diabetes mellitus, type II ([place])    Diabetic neuropathy ([place])    Esophagitis    ESRD (end stage renal disease) on  dialysis ([place])    a.) [facility]; T-Th-Sat   Frequent falls    Gait instability    Gastritis    Gastroparesis    GERD (gastroesophageal reflux disease)    H/O enucleation of left eyeball    Heart palpitations    History of kidney stones    HLD (hyperlipidemia)    Hypertension    Insomnia    a.) uses melatonin PRN   Nausea and vomiting in adult    recurrent   NSTEMI (non-ST elevated myocardial infarction) ([place]) 07/26/2021   a.) hyperkalemic from missed HD; seizures; decompensated to cardiac arrest and required CPR x 1 round prior to ROSC.   Osteomyelitis ([facility])    Pericardial effusion    a. 11/2021 Echo: EF 65-70%, GrII DD, nl RV fxn, sev dil LA, large circumferential pericardial eff w/o tamponade, triv MR, AoV sclerosis. Ao root 65mm.   Seizure ([place])    a.) last 07/26/2021 in setting of missed HD --> hyperkalemic at 6.5 --> pulseless/apneic and required CPR; discharged home on levetiracetam.   ST elevation myocardial infarction (STEMI) of anterior wall ([place]) 02/26/2020   a.) LHC/PCI 02/26/2020 --> EF 65%; LVEDP 11 mmHg; 90% mLAD (3.5 x 20 mm Resolute Onyx DES x 1)   Stroke ([place]) 04/2021   a.) documented as x 3 between 04/2021 and 06/2021     Past Surgical History:  Procedure Laterality Date   A/V FISTULAGRAM Left 08/31/2021  Procedure: A/V Fistulagram;  Surgeon: Algernon Huxley, MD;  Location: [facility];  Service: Cardiovascular;  Laterality: Left;   A/V FISTULAGRAM Left 01/25/2022   Procedure: A/V Fistulagram;  Surgeon: Algernon Huxley, MD;  Location: [facility];  Service: Cardiovascular;  Laterality: Left;   AMPUTATION Left 03/16/2016   Procedure: AMPUTATION DIGIT LEFT HALLUX;  Surgeon: Trula Slade, DPM;  Location: [facility];  Service: Podiatry;  Laterality: Left;  can start around 5    AMPUTATION Right 02/09/2021   Procedure: AMPUTATION DIGIT;  Surgeon: Sherilyn Cooter, MD;  Location: [place] [facility];  Service: Orthopedics;  Laterality: Right;   ARTHROSCOPIC REPAIR ACL  Left    AV FISTULA PLACEMENT Right 05/31/2019   Procedure: Brachiocephalic AV fistula creation;  Surgeon: Algernon Huxley, MD;  Location: [name];  Service: Vascular;  Laterality: Right;   AV FISTULA PLACEMENT Left 08/13/2021   Procedure: ARTERIOVENOUS (AV) FISTULA CREATION (BRACHIALCEPHALIC);  Surgeon: Algernon Huxley, MD;  Location: [name];  Service: Vascular;  Laterality: Left;   COLONOSCOPY WITH PROPOFOL N/A 10/28/2015   Procedure: COLONOSCOPY WITH PROPOFOL;  Surgeon: Lollie Sails, MD;  Location: [facility];  Service: Endoscopy;  Laterality: N/A;   COLONOSCOPY WITH PROPOFOL N/A 10/29/2015   Procedure: COLONOSCOPY WITH PROPOFOL;  Surgeon: Lollie Sails, MD;  Location: [facility] ENDOSCOPY;  Service: Endoscopy;  Laterality: N/A;   CORONARY/GRAFT ACUTE MI REVASCULARIZATION N/A 02/26/2020   Procedure: Coronary/Graft Acute MI Revascularization;  Surgeon: Jettie Booze, MD;  Location: [place] [facility];  Service: Cardiovascular;  Laterality: N/A;   DIALYSIS/PERMA CATHETER INSERTION Right 04/26/2019   Perm Cath    DIALYSIS/PERMA CATHETER INSERTION N/A 04/26/2019   Procedure: DIALYSIS/PERMA CATHETER INSERTION;  Surgeon: Algernon Huxley, MD;  Location: [facility];  Service: Cardiovascular;  Laterality: N/A;   DIALYSIS/PERMA CATHETER INSERTION N/A 05/25/2021   Procedure: DIALYSIS/PERMA CATHETER INSERTION;  Surgeon: Algernon Huxley, MD;  Location: [facility];  Service: Cardiovascular;  Laterality: N/A;   DIALYSIS/PERMA CATHETER INSERTION N/A 08/31/2021   Procedure: DIALYSIS/PERMA CATHETER INSERTION;  Surgeon: Algernon Huxley, MD;  Location: [facility];  Service: Cardiovascular;  Laterality: N/A;   DIALYSIS/PERMA CATHETER REMOVAL N/A 08/15/2019   Procedure: DIALYSIS/PERMA CATHETER REMOVAL;  Surgeon: Katha Cabal, MD;  Location: [facility];  Service: Cardiovascular;  Laterality: N/A;   EMBOLIZATION Right 02/06/2021   Procedure: EMBOLIZATION;  Surgeon: Algernon Huxley, MD;  Location: [facility];  Service: Cardiovascular;  Laterality: Right;  Right Upper Extremity Dialysis Access, Permcath Placement   ESOPHAGOGASTRODUODENOSCOPY (EGD) WITH PROPOFOL N/A 12/27/2017   Procedure: ESOPHAGOGASTRODUODENOSCOPY (EGD) WITH PROPOFOL;  Surgeon: [place], Benay Pike, MD;  Location: [name];  Service: Gastroenterology;  Laterality: N/A;   EYE SURGERY     INTRAVASCULAR ULTRASOUND/IVUS N/A 02/26/2020   Procedure: Intravascular Ultrasound/IVUS;  Surgeon: Jettie Booze, MD;  Location: [facility];  Service: Cardiovascular;  Laterality: N/A;   LEFT HEART CATH AND CORONARY ANGIOGRAPHY N/A 02/26/2020   Procedure: LEFT HEART CATH AND CORONARY ANGIOGRAPHY;  Surgeon: Jettie Booze, MD;  Location: [facility];  Service: Cardiovascular;  Laterality: N/A;   PROSTATE SURGERY  2016   TONSILECTOMY/ADENOIDECTOMY WITH MYRINGOTOMY     TONSILLECTOMY     UPPER EXTREMITY ANGIOGRAPHY Right 11/26/2020   Procedure: Upper Extremity Angiography;  Surgeon: Algernon Huxley, MD;  Location: [facility];  Service: Cardiovascular;  Laterality: Right;   UPPER EXTREMITY ANGIOGRAPHY Right 02/05/2021  Procedure: UPPER EXTREMITY ANGIOGRAPHY;  Surgeon: Algernon Huxley, MD;  Location: [facility];  Service: Cardiovascular;  Laterality: Right;       Latest Ref Rng & Units 02/27/2022   12:55 AM 02/26/2022    2:45 PM 01/30/2022    7:34 AM  CBC  WBC 4.0 - 10.5 K/uL  6.5  6.3   Hemoglobin 13.0 - 17.0 g/dL 8.1  7.8  9.3   Hematocrit 39.0 - 52.0 %  27.4  30.9   Platelets 150 - 400 K/uL  154  203        Latest Ref Rng & Units 02/26/2022    2:45 PM 01/30/2022    7:34 AM 01/09/2022    6:12 PM  BMP  Glucose 70 - 99 mg/dL 76  90  130   BUN 6 - 20 mg/dL 40  88  28   Creatinine 0.61 - 1.24 mg/dL 6.62  10.89  6.28   Sodium 135 - 145 mmol/L 137  142  137   Potassium 3.5 - 5.1 mmol/L 4.1  5.0  3.6   Chloride 98 - 111 mmol/L 105  113  106   CO2 22 - 32  mmol/L 22  18  23    Calcium 8.9 - 10.3 mg/dL 8.4  8.5  7.9      Objective:   Vitals:   02/27/22 0615 02/27/22 0630  BP:    Pulse: 68 62  Resp: 15 16  Temp:    SpO2: 95% 100%    General:AA&O x 3. Normal mood and affect   Vascular: DP and PT non palpable.  Slowed capillary refill to all digits. Pedal hair present   Neruological. Epicritic sensation grossly intact.   Derm: Ladean Raya rightl fifth digit wound wound with necrotic base measuring 1cm. No erythema or edema present. No purulence. Does probe to bone. Interspaces clears of maceration. Nails well groomed and normal in appearance  MSK: MMT 5/5 in dorsiflexion, plantar flexion, inversion and eversion. Normal joint ROM without pain or crepitus.       Assessment & Plan:  Patient was evaluated and treated and all questions answered.  DX: Right fifth digit osteomyeliits  Wound care: betadine, DSD  Antibiotics: Per primary  DME: post-op shoe  Discussed with patient diagnosis and treatment options.  Imaging reviewed. Radiographs showing destruction of proximal fifth digit phalanx indicating osteomyelitis Discussed infection in toe with patient and need for removal of the infected bone. Patient understanding and in agreement. Discussed we will need to make sure he is optimized from a vascular standpoint and clear for surgery from cardiology.   Patient in agreement with plan and all questions answered.  Awaiting clearance but patient will need right fifth digit amputation. If clear from other services will likely take to OR tomorrow if not will await recommendations.   Lorenda Peck, DPM  Accessible via secure chat for questions or concerns.

## 2022-02-27 NOTE — Progress Notes (Signed)
Referring Provider: No ref. provider found Primary Care Physician:  [name], MD Primary Nephrologist:  Dr.   Luiz Iron for Consultation: ESRD.  HPI: 52 year old male with a history of hypertension, coronary artery disease, congestive heart failure, history of CVA, diabetes, peripheral vascular disease, history of osteomyelitis, end-stage renal disease with anemia on dialysis on a Tuesday Thursday Saturday schedule now admitted with history of nonhealing toe ulcers.  Past Medical History:  Diagnosis Date   Anxiety    a.) on BZO (clonazepam) PRN   Aortic dilatation ([place]) 11/25/2020   a.) TTE 11/25/2020: Ao root measured 38 mm. b.) TTE 04/22/2021: Ao root measured 44 mm; c. 11/2021 Echo: Ao root 24mm.   Atrial fibrillation ([place])    a.) CHA2DS2-VASc = 6 (CHF, HTN, CVA x2, prior MI, T2DM). b.) rate/rhythm maintained on oral amiodarone + carvedilol; chronically anticoagulated using dose reduced apixaban + clopidogrel   BPH (benign prostatic hyperplasia)    Cardiac arrest ([place]) 07/26/2021   a.) in setting of hyperkalemia, NSTEMI, and seizure following missed HD; pulseless and apneic --> required 1 round of CPR prior to ROSC   Cellulitis    Cerebral hemorrhage ([place])    a. 10/2021 interventricular hemorrhage in setting of HTN emergency and DOAC/APT-->eliquis/plavix d/c'd.   Chronic diarrhea    Chronic heart failure with preserved ejection fraction (HFpEF) ([place]) 07/19/2017   a. 06/2017 Echo: EF 75%; b. 11/2020 Echo: EF 55-60%; c. 04/2021 Echo: EF 55-60%; d. 11/2021 Echo: EF 65-70%, GrII DD, nl RV fxn, sev dil LA, large circumferential pericardial eff w/o tamponade, triv MR, AoV sclerosis. Ao root 29mm.   Chronic pain of both ankles    Coronary artery disease 02/26/2020   a. 02/2020 Ant STEMI/PCI: LM nl, LAD 69m (3.5x30 Resolute Onyx), RI nl, LCX min irregs, RCA min irregs. EF 65%.   Diabetes mellitus, type II ([place])    Diabetic neuropathy ([place])    Esophagitis    ESRD (end stage renal disease) on  dialysis ([place])    a.) [facility]; T-Th-Sat   Frequent falls    Gait instability    Gastritis    Gastroparesis    GERD (gastroesophageal reflux disease)    H/O enucleation of left eyeball    Heart palpitations    History of kidney stones    HLD (hyperlipidemia)    Hypertension    Insomnia    a.) uses melatonin PRN   Nausea and vomiting in adult    recurrent   NSTEMI (non-ST elevated myocardial infarction) ([place]) 07/26/2021   a.) hyperkalemic from missed HD; seizures; decompensated to cardiac arrest and required CPR x 1 round prior to ROSC.   Osteomyelitis ([facility])    Pericardial effusion    a. 11/2021 Echo: EF 65-70%, GrII DD, nl RV fxn, sev dil LA, large circumferential pericardial eff w/o tamponade, triv MR, AoV sclerosis. Ao root 60mm.   Seizure ([place])    a.) last 07/26/2021 in setting of missed HD --> hyperkalemic at 6.5 --> pulseless/apneic and required CPR; discharged home on levetiracetam.   ST elevation myocardial infarction (STEMI) of anterior wall ([place]) 02/26/2020   a.) LHC/PCI 02/26/2020 --> EF 65%; LVEDP 11 mmHg; 90% mLAD (3.5 x 20 mm Resolute Onyx DES x 1)   Stroke ([place]) 04/2021   a.) documented as x 3 between 04/2021 and 06/2021    Past Surgical History:  Procedure Laterality Date   A/V FISTULAGRAM Left 08/31/2021   Procedure: A/V Fistulagram;  Surgeon: Algernon Huxley, MD;  Location: [facility];  Service: Cardiovascular;  Laterality: Left;   A/V FISTULAGRAM Left 01/25/2022   Procedure: A/V Fistulagram;  Surgeon: Algernon Huxley, MD;  Location: [facility];  Service: Cardiovascular;  Laterality: Left;   AMPUTATION Left 03/16/2016   Procedure: AMPUTATION DIGIT LEFT HALLUX;  Surgeon: Trula Slade, DPM;  Location: [facility];  Service: Podiatry;  Laterality: Left;  can start around 5    AMPUTATION Right 02/09/2021   Procedure: AMPUTATION DIGIT;  Surgeon: Sherilyn Cooter, MD;  Location: [facility];  Service: Orthopedics;  Laterality: Right;   ARTHROSCOPIC REPAIR ACL  Left    AV FISTULA PLACEMENT Right 05/31/2019   Procedure: Brachiocephalic AV fistula creation;  Surgeon: Algernon Huxley, MD;  Location: [name];  Service: Vascular;  Laterality: Right;   AV FISTULA PLACEMENT Left 08/13/2021   Procedure: ARTERIOVENOUS (AV) FISTULA CREATION (BRACHIALCEPHALIC);  Surgeon: Algernon Huxley, MD;  Location: [name];  Service: Vascular;  Laterality: Left;   COLONOSCOPY WITH PROPOFOL N/A 10/28/2015   Procedure: COLONOSCOPY WITH PROPOFOL;  Surgeon: Lollie Sails, MD;  Location: [facility];  Service: Endoscopy;  Laterality: N/A;   COLONOSCOPY WITH PROPOFOL N/A 10/29/2015   Procedure: COLONOSCOPY WITH PROPOFOL;  Surgeon: Lollie Sails, MD;  Location: [facility] ENDOSCOPY;  Service: Endoscopy;  Laterality: N/A;   CORONARY/GRAFT ACUTE MI REVASCULARIZATION N/A 02/26/2020   Procedure: Coronary/Graft Acute MI Revascularization;  Surgeon: Jettie Booze, MD;  Location: [facility];  Service: Cardiovascular;  Laterality: N/A;   DIALYSIS/PERMA CATHETER INSERTION Right 04/26/2019   Perm Cath    DIALYSIS/PERMA CATHETER INSERTION N/A 04/26/2019   Procedure: DIALYSIS/PERMA CATHETER INSERTION;  Surgeon: Algernon Huxley, MD;  Location: [facility];  Service: Cardiovascular;  Laterality: N/A;   DIALYSIS/PERMA CATHETER INSERTION N/A 05/25/2021   Procedure: DIALYSIS/PERMA CATHETER INSERTION;  Surgeon: Algernon Huxley, MD;  Location: [facility];  Service: Cardiovascular;  Laterality: N/A;   DIALYSIS/PERMA CATHETER INSERTION N/A 08/31/2021   Procedure: DIALYSIS/PERMA CATHETER INSERTION;  Surgeon: Algernon Huxley, MD;  Location: [facility];  Service: Cardiovascular;  Laterality: N/A;   DIALYSIS/PERMA CATHETER REMOVAL N/A 08/15/2019   Procedure: DIALYSIS/PERMA CATHETER REMOVAL;  Surgeon: Katha Cabal, MD;  Location: [facility];  Service: Cardiovascular;  Laterality: N/A;   EMBOLIZATION Right 02/06/2021   Procedure: EMBOLIZATION;  Surgeon: Algernon Huxley, MD;  Location: [facility];  Service: Cardiovascular;  Laterality: Right;  Right Upper Extremity Dialysis Access, Permcath Placement   ESOPHAGOGASTRODUODENOSCOPY (EGD) WITH PROPOFOL N/A 12/27/2017   Procedure: ESOPHAGOGASTRODUODENOSCOPY (EGD) WITH PROPOFOL;  Surgeon: [place], Benay Pike, MD;  Location: [name] ENDOSCOPY;  Service: Gastroenterology;  Laterality: N/A;   EYE SURGERY     INTRAVASCULAR ULTRASOUND/IVUS N/A 02/26/2020   Procedure: Intravascular Ultrasound/IVUS;  Surgeon: Jettie Booze, MD;  Location: [facility];  Service: Cardiovascular;  Laterality: N/A;   LEFT HEART CATH AND CORONARY ANGIOGRAPHY N/A 02/26/2020   Procedure: LEFT HEART CATH AND CORONARY ANGIOGRAPHY;  Surgeon: Jettie Booze, MD;  Location: [facility];  Service: Cardiovascular;  Laterality: N/A;   PROSTATE SURGERY  2016   TONSILECTOMY/ADENOIDECTOMY WITH MYRINGOTOMY     TONSILLECTOMY     UPPER EXTREMITY ANGIOGRAPHY Right 11/26/2020   Procedure: Upper Extremity Angiography;  Surgeon: Algernon Huxley, MD;  Location: [facility];  Service: Cardiovascular;  Laterality: Right;   UPPER EXTREMITY ANGIOGRAPHY Right 02/05/2021   Procedure: UPPER EXTREMITY ANGIOGRAPHY;  Surgeon: Algernon Huxley, MD;  Location: [facility];  Service: Cardiovascular;  Laterality: Right;    Prior to Admission medications   Medication Sig Start Date End Date Taking? Authorizing Provider  acarbose (PRECOSE) 50 MG tablet Take 50 mg by mouth 3 (three) times daily. 10/20/21   [provider]  acetaminophen (TYLENOL) 325 MG tablet Take 1-2 tablets (325-650 mg total) by mouth every 4 (four) hours as needed for mild pain. 05/04/21   Love, Ivan Anchors, PA-C  amiodarone (PACERONE) 200 MG tablet Take 1 tablet (200 mg total) by mouth every morning. 08/10/21 08/11/22  Swinyer, Lanice Schwab, NP  amLODipine (NORVASC) 10 MG tablet Take 1 tablet (10 mg total) by mouth daily. 12/03/21   Bonnielee Haff, MD   atorvastatin (LIPITOR) 80 MG tablet Take 1 tablet (80 mg total) by mouth daily. Patient not taking: Reported on 01/25/2022 05/04/21 04/29/22  Love, Ivan Anchors, PA-C  calcium acetate (PHOSLO) 667 MG capsule Take 2 capsules (1,334 mg total) by mouth 3 (three) times daily with meals. 05/04/21   Love, Ivan Anchors, PA-C  carvedilol (COREG) 25 MG tablet Take 25 mg by mouth 2 (two) times daily. 06/16/21   [provider]  cholestyramine Lucrezia Starch) 4 GM/DOSE powder Take 4 g by mouth 2 (two) times daily. 10/20/21   [provider]  cloNIDine (CATAPRES) 0.3 MG tablet Take 1 tablet (0.3 mg total) by mouth every 8 (eight) hours. Patient taking differently: Take 0.3 mg by mouth every 8 (eight) hours. Only if BP over 200 12/03/21   Bonnielee Haff, MD  dorzolamide-timolol (COSOPT) 22.3-6.8 MG/ML ophthalmic solution Place 1 drop into the right eye 2 (two) times daily.    [provider]  hydrocortisone (ANUSOL-HC) 2.5 % rectal cream Place 1 Application rectally 2 (two) times daily. 04/06/21   [provider]  hydrocortisone cream 1 %  05/04/21   [provider]  irbesartan (AVAPRO) 300 MG tablet Take 1 tablet (300 mg total) by mouth daily. 12/03/21   Bonnielee Haff, MD  levETIRAcetam (KEPPRA) 250 MG tablet Take 1 tablet (250 mg total) by mouth Every Tuesday,Thursday,and Saturday with dialysis. 07/30/21   Nita Sells, MD  levETIRAcetam (KEPPRA) 750 MG tablet Take 1 tablet (750 mg total) by mouth 2 (two) times daily. Patient taking differently: Take 750 mg by mouth 2 (two) times daily. Twice daily Only on dialysis days and once on all other days 12/05/21   Bonnielee Haff, MD  lidocaine-prilocaine (EMLA) cream Apply topically. 12/24/21   [provider]  nitroGLYCERIN (NITROSTAT) 0.4 MG SL tablet Place 1 tablet (0.4 mg total) under the tongue every 5 (five) minutes as needed for chest pain. Patient not taking: Reported on 02/25/2022 12/09/20 01/09/22  Murlean Iba, MD  omeprazole (PRILOSEC) 40 MG capsule Take 40 mg by mouth as needed.    [provider]  ondansetron (ZOFRAN-ODT) 4 MG disintegrating tablet Take 4 mg by mouth every 12 (twelve) hours as needed for vomiting or nausea.    [provider]  oxyCODONE-acetaminophen (PERCOCET/ROXICET) 5-325 MG tablet Take 1 tablet by mouth every 4 (four) hours as needed for severe pain. 01/20/22   Kris Hartmann, NP  tamsulosin (FLOMAX) 0.4 MG CAPS capsule Take 0.4 mg by mouth daily. Patient not taking: Reported on 01/09/2022 05/19/21   [provider]    Current Facility-Administered Medications  Medication Dose Route Frequency Provider Last Rate Last Admin   acetaminophen (TYLENOL) tablet 650 mg  650 mg Oral Q6H PRN Athena Masse, MD  Or   acetaminophen (TYLENOL) suppository 650 mg  650 mg Rectal Q6H PRN Athena Masse, MD       alteplase (CATHFLO ACTIVASE) injection 2 mg  2 mg Intracatheter Once PRN Lyla Son, MD       anticoagulant sodium citrate solution 5 mL  5 mL Intracatheter PRN Lyla Son, MD       atorvastatin (LIPITOR) tablet 80 mg  80 mg Oral Daily Theora Gianotti, NP   80 mg at 02/27/22 1029   calcium acetate (PHOSLO) capsule 1,334 mg  1,334 mg Oral TID WC Athena Masse, MD   1,334 mg at 02/27/22 1024   carvedilol (COREG) tablet 25 mg  25 mg Oral BID Athena Masse, MD   25 mg at 02/27/22 1023   ceFEPIme (MAXIPIME) 1 g in sodium chloride 0.9 % 100 mL IVPB  1 g Intravenous Q24H Athena Masse, MD       Chlorhexidine Gluconate Cloth 2 % PADS 6 each  6 each Topical Q0600 Lanora Manis, Johan Antonacci, MD       heparin injection 1,000 Units  1,000 Units Intracatheter PRN Lyla Son, MD       HYDROcodone-acetaminophen (NORCO/VICODIN) 5-325 MG per tablet 1-2 tablet  1-2 tablet Oral Q4H PRN Athena Masse, MD       insulin aspart (novoLOG) injection 0-6 Units  0-6 Units Subcutaneous Q4H Athena Masse, MD       irbesartan (AVAPRO) tablet 300 mg   300 mg Oral Daily Judd Gaudier V, MD   300 mg at 02/27/22 1024   levETIRAcetam (KEPPRA) tablet 750 mg  750 mg Oral 2 times per day on Tue Thu Sat Athena Masse, MD   750 mg at 02/27/22 1024   [START ON 02/28/2022] levETIRAcetam (KEPPRA) tablet 750 mg  750 mg Oral Once per day on Sun Mon Wed Fri Athena Masse, MD       lidocaine (PF) (XYLOCAINE) 1 % injection 5 mL  5 mL Intradermal PRN Lyla Son, MD       lidocaine-prilocaine (EMLA) cream 1 Application  1 Application Topical PRN Lyla Son, MD       morphine (PF) 2 MG/ML injection 2 mg  2 mg Intravenous Q2H PRN Athena Masse, MD       oxyCODONE-acetaminophen (PERCOCET/ROXICET) 5-325 MG per tablet 1 tablet  1 tablet Oral Q4H PRN Athena Masse, MD       pentafluoroprop-tetrafluoroeth (GEBAUERS) aerosol 1 Application  1 Application Topical PRN Lyla Son, MD       vancomycin (VANCOCIN) IVPB 1000 mg/200 mL premix  1,000 mg Intravenous Q T,Th,Sa-HD Athena Masse, MD       Current Outpatient Medications  Medication Sig Dispense Refill   acarbose (PRECOSE) 50 MG tablet Take 50 mg by mouth 3 (three) times daily.     acetaminophen (TYLENOL) 325 MG tablet Take 1-2 tablets (325-650 mg total) by mouth every 4 (four) hours as needed for mild pain.     amiodarone (PACERONE) 200 MG tablet Take 1 tablet (200 mg total) by mouth every morning. 90 tablet 3   amLODipine (NORVASC) 10 MG tablet Take 1 tablet (10 mg total) by mouth daily. 30 tablet 2   atorvastatin (LIPITOR) 80 MG tablet Take 1 tablet (80 mg total) by mouth daily. (Patient not taking: Reported on 01/25/2022) 30 tablet 0   calcium acetate (PHOSLO) 667 MG capsule Take 2 capsules (1,334 mg total) by mouth 3 (three) times daily with meals.  180 capsule 0   carvedilol (COREG) 25 MG tablet Take 25 mg by mouth 2 (two) times daily.     cholestyramine (QUESTRAN) 4 GM/DOSE powder Take 4 g by mouth 2 (two) times daily.     cloNIDine (CATAPRES) 0.3 MG tablet Take 1 tablet (0.3 mg total) by  mouth every 8 (eight) hours. (Patient taking differently: Take 0.3 mg by mouth every 8 (eight) hours. Only if BP over 200) 90 tablet 2   dorzolamide-timolol (COSOPT) 22.3-6.8 MG/ML ophthalmic solution Place 1 drop into the right eye 2 (two) times daily.     hydrocortisone (ANUSOL-HC) 2.5 % rectal cream Place 1 Application rectally 2 (two) times daily.     hydrocortisone cream 1 %      irbesartan (AVAPRO) 300 MG tablet Take 1 tablet (300 mg total) by mouth daily. 30 tablet 2   levETIRAcetam (KEPPRA) 250 MG tablet Take 1 tablet (250 mg total) by mouth Every Tuesday,Thursday,and Saturday with dialysis. 15 tablet 1   levETIRAcetam (KEPPRA) 750 MG tablet Take 1 tablet (750 mg total) by mouth 2 (two) times daily. (Patient taking differently: Take 750 mg by mouth 2 (two) times daily. Twice daily Only on dialysis days and once on all other days) 60 tablet 2   lidocaine-prilocaine (EMLA) cream Apply topically.     nitroGLYCERIN (NITROSTAT) 0.4 MG SL tablet Place 1 tablet (0.4 mg total) under the tongue every 5 (five) minutes as needed for chest pain. (Patient not taking: Reported on 02/25/2022)     omeprazole (PRILOSEC) 40 MG capsule Take 40 mg by mouth as needed.     ondansetron (ZOFRAN-ODT) 4 MG disintegrating tablet Take 4 mg by mouth every 12 (twelve) hours as needed for vomiting or nausea.     oxyCODONE-acetaminophen (PERCOCET/ROXICET) 5-325 MG tablet Take 1 tablet by mouth every 4 (four) hours as needed for severe pain. 20 tablet 0   tamsulosin (FLOMAX) 0.4 MG CAPS capsule Take 0.4 mg by mouth daily. (Patient not taking: Reported on 01/09/2022)      Allergies as of 02/26/2022 - Review Complete 02/26/2022  Allergen Reaction Noted   Promethazine Diarrhea and Other (See Comments) 03/06/2015   Promethazine hcl Other (See Comments) 03/06/2015   Other  05/29/2020   Malt Other (See Comments) 06/24/2017    Family History  Problem Relation Age of Onset   CAD Father    Stroke Father    Diabetes Mellitus  II Mother    Kidney failure Mother    Schizophrenia Mother     Social History   Socioeconomic History   Marital status: Married    Spouse name: [name]    Number of children: 2   Years of education: Not on file   Highest education level: High school graduate  Occupational History   Occupation: Disability    Comment: not employed  Tobacco Use   Smoking status: Never   Smokeless tobacco: Never  Vaping Use   Vaping Use: Never used  Substance and Sexual Activity   Alcohol use: No   Drug use: No   Sexual activity: Yes  Other Topics Concern   Not on file  Social History Narrative   Oldest son killed in car crash June 2020.  Lives in [place] with his wife.   Social Determinants of Health   Financial Resource Strain: High Risk (07/05/2017)   Overall Financial Resource Strain (CARDIA)    Difficulty of Paying Living Expenses: Hard  Food Insecurity: No Food Insecurity (11/26/2021)   Hunger Vital Sign  Worried About Charity fundraiser in the Last Year: Never true    [place] in the Last Year: Never true  Transportation Needs: No Transportation Needs (11/26/2021)   PRAPARE - Hydrologist (Medical): No    Lack of Transportation (Non-Medical): No  Physical Activity: Inactive (07/05/2017)   Exercise Vital Sign    Days of Exercise per Week: 0 days    Minutes of Exercise per Session: 0 min  Stress: Stress Concern Present (07/05/2017)   Door    Feeling of Stress : Rather much  Social Connections: Moderately Integrated (07/05/2017)   Social Connection and Isolation Panel [NHANES]    Frequency of Communication with Friends and Family: Three times a week    Frequency of Social Gatherings with Friends and Family: Once a week    Attends Religious Services: More than 4 times per year    Active Member of [employer] or Organizations: No    Attends Archivist Meetings: Never     Marital Status: Married  Human resources officer Violence: Not At Risk (11/26/2021)   Humiliation, Afraid, Rape, and Kick questionnaire    Fear of Current or Ex-Partner: No    Emotionally Abused: No    Physically Abused: No    Sexually Abused: No    Physical Exam: Vital signs in last 24 hours: Temp:  [97.6 F (36.4 C)-99.3 F (37.4 C)] 97.6 F (36.4 C) (01/06 1220) Pulse Rate:  [56-68] 58 (01/06 1330) Resp:  [11-19] 14 (01/06 1330) BP: (125-189)/(46-91) 154/77 (01/06 1330) SpO2:  [95 %-100 %] 100 % (01/06 1330) Weight:  [81.6 kg-87.8 kg] 87.8 kg (01/06 1222) Last BM Date : 02/27/22 General:   Alert,  Well-developed, well-nourished, pleasant and cooperative in NAD Head:  Normocephalic and atraumatic. Eyes:  Sclera clear, no icterus.   Conjunctiva pink. Ears:  Normal auditory acuity. Nose:  No deformity, discharge,  or lesions. Lungs:  Clear throughout to auscultation.   No wheezes, crackles, or rhonchi. No acute distress. Heart:  Regular rate and rhythm; no murmurs, clicks, rubs,  or gallops. Abdomen:  Soft, nontender and nondistended. No masses, hepatosplenomegaly or hernias noted. Normal bowel sounds, without guarding, and without rebound.   Extremities:  Without clubbing or edema.  Intake/Output from previous day: No intake/output data recorded. Intake/Output this shift: No intake/output data recorded.  Lab Results: Recent Labs    02/26/22 1445 02/27/22 0055  WBC 6.5  --   HGB 7.8* 8.1*  HCT 27.4*  --   PLT 154  --    BMET Recent Labs    02/26/22 1445  NA 137  K 4.1  CL 105  CO2 22  GLUCOSE 76  BUN 40*  CREATININE 6.62*  CALCIUM 8.4*   LFT Recent Labs    02/26/22 1445  PROT 6.7  ALBUMIN 3.3*  AST 13*  ALT 12  ALKPHOS 78  BILITOT 0.8   PT/INR No results for input(s): "LABPROT", "INR" in the last 72 hours. Hepatitis Panel No results for input(s): "HEPBSAG", "HCVAB", "HEPAIGM", "HEPBIGM" in the last 72 hours.  Studies/Results: CT Angio  Aortobifemoral W and/or Wo Contrast  Result Date: 02/26/2022 CLINICAL DATA:  Peripheral arterial disease, osteomyelitis EXAM: CT ANGIOGRAPHY OF ABDOMINAL AORTA WITH ILIOFEMORAL RUNOFF TECHNIQUE: Multidetector CT imaging of the abdomen, pelvis and lower extremities was performed using the standard protocol during bolus administration of intravenous contrast. Multiplanar CT image reconstructions and MIPs were obtained to evaluate  the vascular anatomy. RADIATION DOSE REDUCTION: This exam was performed according to the departmental dose-optimization program which includes automated exposure control, adjustment of the mA and/or kV according to patient size and/or use of iterative reconstruction technique. CONTRAST:  148mL OMNIPAQUE IOHEXOL 350 MG/ML SOLN COMPARISON:  07/18/2017 FINDINGS: VASCULAR Coronary calcifications. Aorta: Minimal calcified atheromatous plaque. No aneurysm, dissection, or stenosis. Celiac: Widely patent. Left gastric artery arises directly from the aorta, an anatomic variant SMA: Patent without evidence of aneurysm, dissection, vasculitis or significant stenosis. Renals: Both renal arteries are patent without evidence of aneurysm, dissection, vasculitis, fibromuscular dysplasia or significant stenosis. IMA: Patent without evidence of aneurysm, dissection, vasculitis or significant stenosis. RIGHT Lower Extremity Inflow: Common iliac: Scattered calcified plaque; no aneurysm, dissection, or significant stenosis. Internal iliac: Mildly atheromatous, patent. External iliac: Patent. Outflow: Common femoral: Patent. Deep femoral branches patent. SFA scattered calcified plaque without stenosis. Popliteal moderate calcified plaque below the knee, without significant stenosis. Runoff: Evaluation limited due to poor scan timing. There is heavy medial calcification. LEFT Lower Extremity Inflow: Common iliac minimally atheromatous, patent. Internal iliac mildly atheromatous, patent External iliac patent.  Outflow: Common femoral patent. Deep femoral branches patent. SFA scattered calcified plaque, no stenosis or occlusion. Popliteal moderate calcified plaque without significant stenosis. Runoff: Limited due to poor arterial opacification, and heavy medial calcification in trifurcation runoff vessels. Veins: Limited venous opacification on this arterial phase study. Curvilinear calcifications in bilateral popliteal and femoral veins suggesting stigmata of previous thrombosis. Patent splenic and portal veins. Review of the MIP images confirms the above findings. NON-VASCULAR Lower chest: Moderate pericardial effusion. No pleural effusion. Minimal atelectasis in the visualized lung bases. Hepatobiliary: No focal liver abnormality is seen. No gallstones, gallbladder wall thickening, or biliary dilatation. Pancreas: Unremarkable. No pancreatic ductal dilatation or surrounding inflammatory changes. Spleen: Normal in size without focal abnormality. Adrenals/Urinary Tract: No adrenal mass. Symmetric renal enhancement without focal lesion or hydronephrosis. Nondistended urinary bladder with wall thickening. Stomach/Bowel: Stomach is incompletely distended, unremarkable. Small bowel decompressed. Normal appendix. The colon is incompletely distended. Circumferential wall thickening in the rectum with mild regional inflammatory/edematous change. Lymphatic: No abdominal or pelvic adenopathy. Reproductive: Prostate unremarkable. Other: No ascites.  No free air. Musculoskeletal: Cortical destruction involving proximal and middle phalanges of the right fifth toe of uncertain chronicity. 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 Subchondral destructive changes at the left second MTP joint. IMPRESSION: 1. No significant aortoiliac or femoropopliteal occlusive disease. 2. Limited evaluation of bilateral tibial runoff due to poor arterial opacification and heavy medial calcification. 3. Moderate pericardial effusion. 4. Wall  thickening in the rectum suggesting proctitis. 5. Destructive changes in the right fifth toe and left second MTP joint of uncertain chronicity. 6. Coronary and aortic Atherosclerosis (ICD10-I70.0). Electronically Signed   By: Lucrezia Europe M.D.   On: 02/26/2022 19:02   DG Foot Complete Right  Result Date: 02/26/2022 CLINICAL DATA:  Chronic wound infection.  Concern for osteomyelitis. EXAM: RIGHT FOOT COMPLETE - 3+ VIEW COMPARISON:  02/26/2022, earlier today. FINDINGS: There is diffuse soft tissue edema. Extensive vascular calcifications identified. Signs of bony destruction involving the head of the fifth proximal phalanx, fifth middle phalanx and fifth distal phalanx. Imaging findings are compatible with underlying acute osteomyelitis. No additional areas of bone erosion identified. No acute fracture. IMPRESSION: 1. Signs of acute osteomyelitis involving the head of the fifth proximal phalanx, fifth middle phalanx and fifth distal phalanx. 2. Diffuse soft tissue edema. 3. Extensive vascular calcifications. Electronically Signed   By: Lovena Le  Clovis Riley M.D.   On: 02/26/2022 18:58    Assessment/Plan: 52 year old male with a history of hypertension, coronary artery disease, congestive heart failure, history of CVA, diabetes, peripheral vascular disease, history of osteomyelitis, end-stage renal disease with anemia on dialysis on a Tuesday Thursday Saturday schedule now admitted with history of nonhealing toe ulcers.  ESRD: Dialysis via AV fistula today.  Will attempt to remove 2 to 3 L of fluid as tolerated.  ANEMIA: We will continue to monitor closely and follow anemia protocols.  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  Will continue calcium acetate.  HTN/VOL: Blood pressure is well-controlled.  ACCESS: Access works very well.  Acute osteomyelitis: On cefepime and vancomycin.  Vascular follow-up.  Will follow closely.   LOS: [place], MD [facility] @TODAY @1 :49 PM

## 2022-02-27 NOTE — Progress Notes (Addendum)
       CROSS COVER NOTE  NAME: [name]. MRN: [id number] DOB : 08-07-1970 ATTENDING PHYSICIAN: Athena Masse, MD    Date of Service   02/27/2022   HPI/Events of Note   Message received from nursing reported ongoing hypertension, current BP 204/98.  [name] is asymptomatic at this time.  Attending physician ordered PRN IV hydralazine that was given at 1823 with poor response. Clonidine and amlodipine also reordered today and currently in signed and held orders. Instructed nursing staff to release signed and held orders and administer ordered medications.  Interventions   Assessment/Plan: Nursing to release signed and held orders Give Clonidine 0.3 mg PO now      To reach the provider On-Call:   7AM- 7PM see care teams to locate the attending and reach out to them via [email or web address]. 7PM-7AM contact night-coverage If you still have difficulty reaching the appropriate provider, please page the [facility] (Director on Call) for [facility] on amion for assistance  This document was prepared using Set designer software and may include unintentional dictation errors.  Neomia Glass DNP, MBA, FNP-BC Nurse Practitioner [facility] Pager [phone]

## 2022-02-27 NOTE — Consult Note (Signed)
Reason for Consult: Osteomyelitis right fifth toe Referring Physician: Dr. Leroy Kennedy [name] Dolores. is an 52 y.o. male.  HPI: This is a gentleman who is 52 years of age, poorly controlled insulin-dependent diabetes, hypertension, dyslipidemia, renal failure, coronary disease, who comes to the emergency room as a referral for right toe wound that has been nonhealing.  Of note he has had toe amputations performed on the left foot that have healed uneventfully in the past.  This has been staged.  He had a CTA done that demonstrated macrovascular patency from the aorta to the popliteals, but there is concern that he had significant tibial occlusive disease given the calcification seen with ill time to contrast filling.  No PVL study has been done.  He has been seen by podiatry and the desires to do amputation of this portion of the foot, and cardiology.  I am asked to see him for clearance from a vascular standpoint.  He has not had imaging done previously.  He has had a multitude of dialysis procedures.  Past Medical History:  Diagnosis Date   Anxiety    a.) on BZO (clonazepam) PRN   Aortic dilatation ([place]) 11/25/2020   a.) TTE 11/25/2020: Ao root measured 38 mm. b.) TTE 04/22/2021: Ao root measured 44 mm; c. 11/2021 Echo: Ao root 58mm.   Atrial fibrillation ([place])    a.) CHA2DS2-VASc = 6 (CHF, HTN, CVA x2, prior MI, T2DM). b.) rate/rhythm maintained on oral amiodarone + carvedilol; chronically anticoagulated using dose reduced apixaban + clopidogrel   BPH (benign prostatic hyperplasia)    Cardiac arrest ([place]) 07/26/2021   a.) in setting of hyperkalemia, NSTEMI, and seizure following missed HD; pulseless and apneic --> required 1 round of CPR prior to ROSC   Cellulitis    Cerebral hemorrhage ([place])    a. 10/2021 interventricular hemorrhage in setting of HTN emergency and DOAC/APT-->eliquis/plavix d/c'd.   Chronic diarrhea    Chronic heart failure with preserved ejection fraction (HFpEF) ([place])  07/19/2017   a. 06/2017 Echo: EF 75%; b. 11/2020 Echo: EF 55-60%; c. 04/2021 Echo: EF 55-60%; d. 11/2021 Echo: EF 65-70%, GrII DD, nl RV fxn, sev dil LA, large circumferential pericardial eff w/o tamponade, triv MR, AoV sclerosis. Ao root 74mm.   Chronic pain of both ankles    Coronary artery disease 02/26/2020   a. 02/2020 Ant STEMI/PCI: LM nl, LAD 33m (3.5x30 Resolute Onyx), RI nl, LCX min irregs, RCA min irregs. EF 65%.   Diabetes mellitus, type II ([place])    Diabetic neuropathy ([place])    Esophagitis    ESRD (end stage renal disease) on dialysis ([place])    a.) [facility]; T-Th-Sat   Frequent falls    Gait instability    Gastritis    Gastroparesis    GERD (gastroesophageal reflux disease)    H/O enucleation of left eyeball    Heart palpitations    History of kidney stones    HLD (hyperlipidemia)    Hypertension    Insomnia    a.) uses melatonin PRN   Nausea and vomiting in adult    recurrent   NSTEMI (non-ST elevated myocardial infarction) ([place]) 07/26/2021   a.) hyperkalemic from missed HD; seizures; decompensated to cardiac arrest and required CPR x 1 round prior to ROSC.   Osteomyelitis (H[facility])    Pericardial effusion    a. 11/2021 Echo: EF 65-70%, GrII DD, nl RV fxn, sev dil LA, large circumferential pericardial eff w/o tamponade, triv MR, AoV sclerosis. Ao root 51mm.  Seizure ([place])    a.) last 07/26/2021 in setting of missed HD --> hyperkalemic at 6.5 --> pulseless/apneic and required CPR; discharged home on levetiracetam.   ST elevation myocardial infarction (STEMI) of anterior wall ([place]) 02/26/2020   a.) LHC/PCI 02/26/2020 --> EF 65%; LVEDP 11 mmHg; 90% mLAD (3.5 x 20 mm Resolute Onyx DES x 1)   Stroke ([place]) 04/2021   a.) documented as x 3 between 04/2021 and 06/2021    Past Surgical History:  Procedure Laterality Date   A/V FISTULAGRAM Left 08/31/2021   Procedure: A/V Fistulagram;  Surgeon: Algernon Huxley, MD;  Location: [place] [facility];  Service: Cardiovascular;  Laterality:  Left;   A/V FISTULAGRAM Left 01/25/2022   Procedure: A/V Fistulagram;  Surgeon: Algernon Huxley, MD;  Location: [place] [facility];  Service: Cardiovascular;  Laterality: Left;   AMPUTATION Left 03/16/2016   Procedure: AMPUTATION DIGIT LEFT HALLUX;  Surgeon: Trula Slade, DPM;  Location: [place];  Service: Podiatry;  Laterality: Left;  can start around 5    AMPUTATION Right 02/09/2021   Procedure: AMPUTATION DIGIT;  Surgeon: Sherilyn Cooter, MD;  Location: [place];  Service: Orthopedics;  Laterality: Right;   ARTHROSCOPIC REPAIR ACL Left    AV FISTULA PLACEMENT Right 05/31/2019   Procedure: Brachiocephalic AV fistula creation;  Surgeon: Algernon Huxley, MD;  Location: [name];  Service: Vascular;  Laterality: Right;   AV FISTULA PLACEMENT Left 08/13/2021   Procedure: ARTERIOVENOUS (AV) FISTULA CREATION (BRACHIALCEPHALIC);  Surgeon: Algernon Huxley, MD;  Location: [name];  Service: Vascular;  Laterality: Left;   COLONOSCOPY WITH PROPOFOL N/A 10/28/2015   Procedure: COLONOSCOPY WITH PROPOFOL;  Surgeon: Lollie Sails, MD;  Location: [facility];  Service: Endoscopy;  Laterality: N/A;   COLONOSCOPY WITH PROPOFOL N/A 10/29/2015   Procedure: COLONOSCOPY WITH PROPOFOL;  Surgeon: Lollie Sails, MD;  Location: [facility]OSCOPY;  Service: Endoscopy;  Laterality: N/A;   CORONARY/GRAFT ACUTE MI REVASCULARIZATION N/A 02/26/2020   Procedure: Coronary/Graft Acute MI Revascularization;  Surgeon: Jettie Booze, MD;  Location: [place] [facility];  Service: Cardiovascular;  Laterality: N/A;   DIALYSIS/PERMA CATHETER INSERTION Right 04/26/2019   Perm Cath    DIALYSIS/PERMA CATHETER INSERTION N/A 04/26/2019   Procedure: DIALYSIS/PERMA CATHETER INSERTION;  Surgeon: Algernon Huxley, MD;  Location: [place] [facility];  Service: Cardiovascular;  Laterality: N/A;   DIALYSIS/PERMA CATHETER INSERTION N/A 05/25/2021   Procedure: DIALYSIS/PERMA CATHETER INSERTION;  Surgeon: Algernon Huxley, MD;  Location: [place] [facility];  Service: Cardiovascular;  Laterality: N/A;   DIALYSIS/PERMA CATHETER INSERTION N/A 08/31/2021   Procedure: DIALYSIS/PERMA CATHETER INSERTION;  Surgeon: Algernon Huxley, MD;  Location: [place] [facility];  Service: Cardiovascular;  Laterality: N/A;   DIALYSIS/PERMA CATHETER REMOVAL N/A 08/15/2019   Procedure: DIALYSIS/PERMA CATHETER REMOVAL;  Surgeon: Katha Cabal, MD;  Location: [place] [facility];  Service: Cardiovascular;  Laterality: N/A;   EMBOLIZATION Right 02/06/2021   Procedure: EMBOLIZATION;  Surgeon: Algernon Huxley, MD;  Location: [place] [facility];  Service: Cardiovascular;  Laterality: Right;  Right Upper Extremity Dialysis Access, Permcath Placement   ESOPHAGOGASTRODUODENOSCOPY (EGD) WITH PROPOFOL N/A 12/27/2017   Procedure: ESOPHAGOGASTRODUODENOSCOPY (EGD) WITH PROPOFOL;  Surgeon: Toledo, Benay Pike, MD;  Location: [name] ENDOSCOPY;  Service: Gastroenterology;  Laterality: N/A;   EYE SURGERY     INTRAVASCULAR ULTRASOUND/IVUS N/A 02/26/2020   Procedure: Intravascular Ultrasound/IVUS;  Surgeon: Jettie Booze, MD;  Location: [place] [facility];  Service: Cardiovascular;  Laterality:  N/A;   LEFT HEART CATH AND CORONARY ANGIOGRAPHY N/A 02/26/2020   Procedure: LEFT HEART CATH AND CORONARY ANGIOGRAPHY;  Surgeon: Jettie Booze, MD;  Location: [facility] [facility];  Service: Cardiovascular;  Laterality: N/A;   PROSTATE SURGERY  2016   TONSILECTOMY/ADENOIDECTOMY WITH MYRINGOTOMY     TONSILLECTOMY     UPPER EXTREMITY ANGIOGRAPHY Right 11/26/2020   Procedure: Upper Extremity Angiography;  Surgeon: Algernon Huxley, MD;  Location: [place] [facility];  Service: Cardiovascular;  Laterality: Right;   UPPER EXTREMITY ANGIOGRAPHY Right 02/05/2021   Procedure: UPPER EXTREMITY ANGIOGRAPHY;  Surgeon: Algernon Huxley, MD;  Location: [place] [facility];  Service: Cardiovascular;  Laterality: Right;    Family History  Problem Relation Age of Onset   CAD Father    Stroke  Father    Diabetes Mellitus II Mother    Kidney failure Mother    Schizophrenia Mother     Social History:  reports that he has never smoked. He has never used smokeless tobacco. He reports that he does not drink alcohol and does not use drugs.  Allergies:  Allergies  Allergen Reactions   Promethazine Diarrhea and Other (See Comments)    Muscle cramps   Promethazine Hcl Other (See Comments)    Cramping all over  Other reaction(s): cramp all over   Other     Other reaction(s): Unknown Other reaction(s): Unknown   Malt Other (See Comments)    Cramping    Medications: I have reviewed the patient's current medications.  Results for orders placed or performed during the hospital encounter of 02/26/22 (from the past 48 hour(s))  Lactic acid, plasma     Status: None   Collection Time: 02/26/22  2:45 PM  Result Value Ref Range   Lactic Acid, Venous 0.8 0.5 - 1.9 mmol/L    Comment: Performed at [facility], [address], [place], [place] [address]  Comprehensive metabolic panel     Status: Abnormal   Collection Time: 02/26/22  2:45 PM  Result Value Ref Range   Sodium 137 135 - 145 mmol/L   Potassium 4.1 3.5 - 5.1 mmol/L   Chloride 105 98 - 111 mmol/L   CO2 22 22 - 32 mmol/L   Glucose, Bld 76 70 - 99 mg/dL    Comment: Glucose reference range applies only to samples taken after fasting for at least 8 hours.   BUN 40 (H) 6 - 20 mg/dL   Creatinine, Ser 6.62 (H) 0.61 - 1.24 mg/dL   Calcium 8.4 (L) 8.9 - 10.3 mg/dL   Total Protein 6.7 6.5 - 8.1 g/dL   Albumin 3.3 (L) 3.5 - 5.0 g/dL   AST 13 (L) 15 - 41 U/L   ALT 12 0 - 44 U/L   Alkaline Phosphatase 78 38 - 126 U/L   Total Bilirubin 0.8 0.3 - 1.2 mg/dL   GFR, Estimated 9 (L) >60 mL/min    Comment: (NOTE) Calculated using the CKD-EPI Creatinine Equation (2021)    Anion gap 10 5 - 15    Comment: Performed at [facility], [address] [place], [place] [address]  CBC with Differential     Status:  Abnormal   Collection Time: 02/26/22  2:45 PM  Result Value Ref Range   WBC 6.5 4.0 - 10.5 K/uL   RBC 3.80 (L) 4.22 - 5.81 MIL/uL   Hemoglobin 7.8 (L) 13.0 - 17.0 g/dL    Comment: Reticulocyte Hemoglobin testing may be clinically indicated, consider  ordering this additional test [id number]    HCT 27.4 (L) 39.0 - 52.0 %   MCV 72.1 (L) 80.0 - 100.0 fL   MCH 20.5 (L) 26.0 - 34.0 pg   MCHC 28.5 (L) 30.0 - 36.0 g/dL   RDW 28.1 (H) 11.5 - 15.5 %   Platelets 154 150 - 400 K/uL   nRBC 0.5 (H) 0.0 - 0.2 %   Neutrophils Relative % 54 %   Neutro Abs 3.5 1.7 - 7.7 K/uL   Lymphocytes Relative 28 %   Lymphs Abs 1.8 0.7 - 4.0 K/uL   Monocytes Relative 11 %   Monocytes Absolute 0.7 0.1 - 1.0 K/uL   Eosinophils Relative 6 %   Eosinophils Absolute 0.4 0.0 - 0.5 K/uL   Basophils Relative 1 %   Basophils Absolute 0.1 0.0 - 0.1 K/uL   WBC Morphology MORPHOLOGY UNREMARKABLE    RBC Morphology MIXED RBC MORPHOLOGY    Smear Review Normal platelet morphology    Immature Granulocytes 0 %   Abs Immature Granulocytes 0.02 0.00 - 0.07 K/uL    Comment: Performed at [facility], [address], [place], [place] [address]  Folate     Status: None   Collection Time: 02/26/22  2:45 PM  Result Value Ref Range   Folate 6.1 >5.9 ng/mL    Comment: Performed at [facility], [address] [place], [place] [address]  Iron and TIBC     Status: Abnormal   Collection Time: 02/26/22  2:45 PM  Result Value Ref Range   Iron 40 (L) 45 - 182 ug/dL   TIBC 172 (L) 250 - 450 ug/dL   Saturation Ratios 23 17.9 - 39.5 %   UIBC 132 ug/dL    Comment: Performed at [facility] [address] [place], [place] [address]  Ferritin     Status: None   Collection Time: 02/26/22  2:45 PM  Result Value Ref Range   Ferritin 206 24 - 336 ng/mL    Comment: Performed at [facility], [address] [place], [place] [address]  Reticulocytes     Status: Abnormal   Collection Time: 02/26/22   2:45 PM  Result Value Ref Range   Retic Ct Pct 2.7 0.4 - 3.1 %   RBC. 3.78 (L) 4.22 - 5.81 MIL/uL   Retic Count, Absolute 102.8 19.0 - 186.0 K/uL   Immature Retic Fract 40.3 (H) 2.3 - 15.9 %    Comment: Performed at [facility], [address] [place], [place] [address]  Lactic acid, plasma     Status: None   Collection Time: 02/26/22  5:22 PM  Result Value Ref Range   Lactic Acid, Venous 0.7 0.5 - 1.9 mmol/L    Comment: Performed at [facility], [address] [place], [place] [address]  Blood culture (routine x 2)     Status: None (Preliminary result)   Collection Time: 02/26/22  5:22 PM   Specimen: BLOOD  Result Value Ref Range   Specimen Description BLOOD BLOOD RIGHT ARM    Special Requests      BOTTLES DRAWN AEROBIC AND ANAEROBIC Blood Culture adequate volume   Culture      NO GROWTH < 24 HOURS Performed at [facility], [address] [place],  [address]    Report Status PENDING   Blood culture (routine x 2)     Status: None (Preliminary result)   Collection Time: 02/27/22 12:55 AM   Specimen: BLOOD  Result Value Ref Range   Specimen Description BLOOD BLOOD RIGHT HAND    Special Requests      BOTTLES DRAWN AEROBIC AND ANAEROBIC Blood Culture results may not be optimal due to an inadequate volume of blood received in culture bottles   Culture      NO GROWTH < 12 HOURS Performed at [facility], [address] [place], [place] [address]    Report Status PENDING   Vitamin B12     Status: None   Collection Time: 02/27/22 12:55 AM  Result Value Ref Range   Vitamin B-12 344 180 - 914 pg/mL    Comment: (NOTE) This assay is not validated for testing neonatal or myeloproliferative syndrome specimens for Vitamin B12 levels. Performed at [facility], [place] [address], [place], [place] [address]   Sedimentation rate     Status: None   Collection Time: 02/27/22 12:55 AM  Result Value Ref Range   Sed Rate 9 0 - 20  mm/hr    Comment: Performed at [facility], [address] [place], [place] [address]  C-reactive protein     Status: None   Collection Time: 02/27/22 12:55 AM  Result Value Ref Range   CRP <0.5 <1.0 mg/dL    Comment: Performed at [facility], [place] [address], [place], [place] [address]  Prealbumin     Status: None   Collection Time: 02/27/22 12:55 AM  Result Value Ref Range   Prealbumin 22 18 - 38 mg/dL    Comment: Performed at [facility] [address] [place], [place] [address]  Type and screen [place]     Status: None   Collection Time: 02/27/22 12:55 AM  Result Value Ref Range   ABO/RH(D) A POS    Antibody Screen NEG    Sample Expiration      03/02/2022,2359 Performed at [facility], [address] [place], [place] [address]   Hemoglobin     Status: Abnormal   Collection Time: 02/27/22 12:55 AM  Result Value Ref Range   Hemoglobin 8.1 (L) 13.0 - 17.0 g/dL    Comment: Performed at [facility], [place], [place] [address]    CT Angio Aortobifemoral W and/or Wo Contrast  Result Date: 02/26/2022 CLINICAL DATA:  Peripheral arterial disease, osteomyelitis EXAM: CT ANGIOGRAPHY OF ABDOMINAL AORTA WITH ILIOFEMORAL RUNOFF TECHNIQUE: Multidetector CT imaging of the abdomen, pelvis and lower extremities was performed using the standard protocol during bolus administration of intravenous contrast. Multiplanar CT image reconstructions and MIPs were obtained to evaluate the vascular anatomy. RADIATION DOSE REDUCTION: This exam was performed according to the departmental dose-optimization program which includes automated exposure control, adjustment of the mA and/or kV according to patient size and/or use of iterative reconstruction technique. CONTRAST:  158mL OMNIPAQUE IOHEXOL 350 MG/ML SOLN COMPARISON:  07/18/2017 FINDINGS: VASCULAR Coronary calcifications. Aorta: Minimal calcified atheromatous plaque. No  aneurysm, dissection, or stenosis. Celiac: Widely patent. Left gastric artery arises directly from the aorta, an anatomic variant SMA: Patent without evidence of aneurysm, dissection, vasculitis or significant stenosis. Renals: Both renal arteries are patent without evidence of aneurysm, dissection, vasculitis, fibromuscular dysplasia or significant stenosis. IMA: Patent without evidence of aneurysm, dissection, vasculitis or significant stenosis. RIGHT Lower Extremity Inflow: Common iliac: Scattered calcified plaque; no aneurysm, dissection, or significant stenosis. Internal iliac: Mildly atheromatous, patent. External iliac: Patent. Outflow: Common femoral: Patent. Deep femoral branches patent. SFA scattered calcified plaque without stenosis.  Popliteal moderate calcified plaque below the knee, without significant stenosis. Runoff: Evaluation limited due to poor scan timing. There is heavy medial calcification. LEFT Lower Extremity Inflow: Common iliac minimally atheromatous, patent. Internal iliac mildly atheromatous, patent External iliac patent. Outflow: Common femoral patent. Deep femoral branches patent. SFA scattered calcified plaque, no stenosis or occlusion. Popliteal moderate calcified plaque without significant stenosis. Runoff: Limited due to poor arterial opacification, and heavy medial calcification in trifurcation runoff vessels. Veins: Limited venous opacification on this arterial phase study. Curvilinear calcifications in bilateral popliteal and femoral veins suggesting stigmata of previous thrombosis. Patent splenic and portal veins. Review of the MIP images confirms the above findings. NON-VASCULAR Lower chest: Moderate pericardial effusion. No pleural effusion. Minimal atelectasis in the visualized lung bases. Hepatobiliary: No focal liver abnormality is seen. No gallstones, gallbladder wall thickening, or biliary dilatation. Pancreas: Unremarkable. No pancreatic ductal dilatation or surrounding  inflammatory changes. Spleen: Normal in size without focal abnormality. Adrenals/Urinary Tract: No adrenal mass. Symmetric renal enhancement without focal lesion or hydronephrosis. Nondistended urinary bladder with wall thickening. Stomach/Bowel: Stomach is incompletely distended, unremarkable. Small bowel decompressed. Normal appendix. The colon is incompletely distended. Circumferential wall thickening in the rectum with mild regional inflammatory/edematous change. Lymphatic: No abdominal or pelvic adenopathy. Reproductive: Prostate unremarkable. Other: No ascites.  No free air. Musculoskeletal: Cortical destruction involving proximal and middle phalanges of the right fifth toe of uncertain chronicity. 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 Subchondral destructive changes at the left second MTP joint. IMPRESSION: 1. No significant aortoiliac or femoropopliteal occlusive disease. 2. Limited evaluation of bilateral tibial runoff due to poor arterial opacification and heavy medial calcification. 3. Moderate pericardial effusion. 4. Wall thickening in the rectum suggesting proctitis. 5. Destructive changes in the right fifth toe and left second MTP joint of uncertain chronicity. 6. Coronary and aortic Atherosclerosis (ICD10-I70.0). Electronically Signed   By: Lucrezia Europe M.D.   On: 02/26/2022 19:02   DG Foot Complete Right  Result Date: 02/26/2022 CLINICAL DATA:  Chronic wound infection.  Concern for osteomyelitis. EXAM: RIGHT FOOT COMPLETE - 3+ VIEW COMPARISON:  02/26/2022, earlier today. FINDINGS: There is diffuse soft tissue edema. Extensive vascular calcifications identified. Signs of bony destruction involving the head of the fifth proximal phalanx, fifth middle phalanx and fifth distal phalanx. Imaging findings are compatible with underlying acute osteomyelitis. No additional areas of bone erosion identified. No acute fracture. IMPRESSION: 1. Signs of acute osteomyelitis involving the head of the  fifth proximal phalanx, fifth middle phalanx and fifth distal phalanx. 2. Diffuse soft tissue edema. 3. Extensive vascular calcifications. Electronically Signed   By: Kerby Moors M.D.   On: 02/26/2022 18:58    Review of Systems Blood pressure (!) 168/77, pulse 62, temperature 99.3 F (37.4 C), resp. rate 16, height 6\' 3"  (1.905 m), weight 81.6 kg, SpO2 100 %. Physical Exam  Patient is in the bed, alert and oriented, no distress.  Vital signs are stable as enumerated above.  He has excellent popliteal pulses, no pulses in the foot, but both feet are equally warm.  He has excellent signal by Doppler in the dorsalis pedis and posterior tibial, slightly less so in the peroneal.  He has a wound as described previously, with pictures in the chart.    Assessment/Plan: PAD with osteomyelitis: This gentleman has classic severe medial calcification in the tibial distribution due to diabetes and renal failure.  However, he has excellent patency based on his signals.  There is no indication for arteriogram under  this setting.  Healing will be dictated by his medical issues, diabetes and renal failure.  He is warm and well-perfused currently.  I will contact podiatry and cardiology and primary service with these recommendations.  All questions were answered.  Kehlani Vancamp 02/27/2022, 10:22 AM

## 2022-02-27 NOTE — Assessment & Plan Note (Signed)
Will get cardiology consult for clearance given extensive history

## 2022-02-27 NOTE — ED Notes (Signed)
Refused CBG saying he has been off antidiabetics for several years.   Also sts he does not produce urine.

## 2022-02-27 NOTE — Progress Notes (Signed)
Pre hd rn assessment 

## 2022-02-27 NOTE — Consult Note (Addendum)
Cardiology Consult    Patient ID: [name]. MRN: [id number], DOB/AGE: 07/08/1970   Admit date: 02/26/2022 Date of Consult: 02/27/2022  Primary Physician: [name], MD Primary Cardiologist: Larae Grooms, MD Requesting Provider: B. Mal Misty, MD  Patient Profile    [name]. is a 52 y.o. male with a history of CAD s/p ant STEMI in 02/2020 w/ DES  LAD, HTN, HL, DMII, PAD, PAF (no longer on [place]), strokes, intracranial hemorrhage (10/2021 in setting of HTN emergency, NOAC, and APT), chronic HFpEF, large pericardial effusion (11/2021), ESRD on HD, sz d/o, and Ao dilatation (39mm 11/2021 echo), who is being seen today for preoperative cardiovascular risk stratification in the setting of R foot osteomyelitis pending amputation at the request of Dr. Mal Misty.  Past Medical History   Past Medical History:  Diagnosis Date   Anxiety    a.) on BZO (clonazepam) PRN   Aortic dilatation ([place]) 11/25/2020   a.) TTE 11/25/2020: Ao root measured 38 mm. b.) TTE 04/22/2021: Ao root measured 44 mm; c. 11/2021 Echo: Ao root 71mm.   Atrial fibrillation ([place])    a.) CHA2DS2-VASc = 6 (CHF, HTN, CVA x2, prior MI, T2DM). b.) rate/rhythm maintained on oral amiodarone + carvedilol; chronically anticoagulated using dose reduced apixaban + clopidogrel   BPH (benign prostatic hyperplasia)    Cardiac arrest ([place]) 07/26/2021   a.) in setting of hyperkalemia, NSTEMI, and seizure following missed HD; pulseless and apneic --> required 1 round of CPR prior to ROSC   Cellulitis    Cerebral hemorrhage ([place])    a. 10/2021 interventricular hemorrhage in setting of HTN emergency and DOAC/APT-->eliquis/plavix d/c'd.   Chronic diarrhea    Chronic heart failure with preserved ejection fraction (HFpEF) ([place]) 07/19/2017   a. 06/2017 Echo: EF 75%; b. 11/2020 Echo: EF 55-60%; c. 04/2021 Echo: EF 55-60%; d. 11/2021 Echo: EF 65-70%, GrII DD, nl RV fxn, sev dil LA, large circumferential pericardial eff w/o tamponade,  triv MR, AoV sclerosis. Ao root 39mm.   Chronic pain of both ankles    Coronary artery disease 02/26/2020   a. 02/2020 Ant STEMI/PCI: LM nl, LAD 13m (3.5x30 Resolute Onyx), RI nl, LCX min irregs, RCA min irregs. EF 65%.   Diabetes mellitus, type II ([place])    Diabetic neuropathy ([place])    Esophagitis    ESRD (end stage renal disease) on dialysis ([place])    a.) [facility]; T-Th-Sat   Frequent falls    Gait instability    Gastritis    Gastroparesis    GERD (gastroesophageal reflux disease)    H/O enucleation of left eyeball    Heart palpitations    History of kidney stones    HLD (hyperlipidemia)    Hypertension    Insomnia    a.) uses melatonin PRN   Nausea and vomiting in adult    recurrent   NSTEMI (non-ST elevated myocardial infarction) ([place]) 07/26/2021   a.) hyperkalemic from missed HD; seizures; decompensated to cardiac arrest and required CPR x 1 round prior to ROSC.   Osteomyelitis ([facility])    Pericardial effusion    a. 11/2021 Echo: EF 65-70%, GrII DD, nl RV fxn, sev dil LA, large circumferential pericardial eff w/o tamponade, triv MR, AoV sclerosis. Ao root 48mm.   Seizure ([place])    a.) last 07/26/2021 in setting of missed HD --> hyperkalemic at 6.5 --> pulseless/apneic and required CPR; discharged home on levetiracetam.   ST elevation myocardial infarction (STEMI) of anterior wall ([place]) 02/26/2020   a.) LHC/PCI  02/26/2020 --> EF 65%; LVEDP 11 mmHg; 90% mLAD (3.5 x 20 mm Resolute Onyx DES x 1)   Stroke ([place]) 04/2021   a.) documented as x 3 between 04/2021 and 06/2021    Past Surgical History:  Procedure Laterality Date   A/V FISTULAGRAM Left 08/31/2021   Procedure: A/V Fistulagram;  Surgeon: Algernon Huxley, MD;  Location: [facility];  Service: Cardiovascular;  Laterality: Left;   A/V FISTULAGRAM Left 01/25/2022   Procedure: A/V Fistulagram;  Surgeon: Algernon Huxley, MD;  Location: [facility];  Service: Cardiovascular;  Laterality: Left;   AMPUTATION Left 03/16/2016    Procedure: AMPUTATION DIGIT LEFT HALLUX;  Surgeon: Trula Slade, DPM;  Location: [facility];  Service: Podiatry;  Laterality: Left;  can start around 5    AMPUTATION Right 02/09/2021   Procedure: AMPUTATION DIGIT;  Surgeon: Sherilyn Cooter, MD;  Location: [facility];  Service: Orthopedics;  Laterality: Right;   ARTHROSCOPIC REPAIR ACL Left    AV FISTULA PLACEMENT Right 05/31/2019   Procedure: Brachiocephalic AV fistula creation;  Surgeon: Algernon Huxley, MD;  Location: [name];  Service: Vascular;  Laterality: Right;   AV FISTULA PLACEMENT Left 08/13/2021   Procedure: ARTERIOVENOUS (AV) FISTULA CREATION (BRACHIALCEPHALIC);  Surgeon: Algernon Huxley, MD;  Location: [name];  Service: Vascular;  Laterality: Left;   COLONOSCOPY WITH PROPOFOL N/A 10/28/2015   Procedure: COLONOSCOPY WITH PROPOFOL;  Surgeon: Lollie Sails, MD;  Location[facility];  Service: Endoscopy;  Laterality: N/A;   COLONOSCOPY WITH PROPOFOL N/A 10/29/2015   Procedure: COLONOSCOPY WITH PROPOFOL;  Surgeon: Lollie Sails, MD;  Location: [facility] ENDOSCOPY;  Service: Endoscopy;  Laterality: N/A;   CORONARY/GRAFT ACUTE MI REVASCULARIZATION N/A 02/26/2020   Procedure: Coronary/Graft Acute MI Revascularization;  Surgeon: Jettie Booze, MD;  Location: [place] [facility];  Service: Cardiovascular;  Laterality: N/A;   DIALYSIS/PERMA CATHETER INSERTION Right 04/26/2019   Perm Cath    DIALYSIS/PERMA CATHETER INSERTION N/A 04/26/2019   Procedure: DIALYSIS/PERMA CATHETER INSERTION;  Surgeon: Algernon Huxley, MD;  Location: [place] [facility];  Service: Cardiovascular;  Laterality: N/A;   DIALYSIS/PERMA CATHETER INSERTION N/A 05/25/2021   Procedure: DIALYSIS/PERMA CATHETER INSERTION;  Surgeon: Algernon Huxley, MD;  Location: [place] [facility];  Service: Cardiovascular;  Laterality: N/A;   DIALYSIS/PERMA CATHETER INSERTION N/A 08/31/2021   Procedure: DIALYSIS/PERMA CATHETER INSERTION;  Surgeon: Algernon Huxley, MD;  Location: [place]  [facility];  Service: Cardiovascular;  Laterality: N/A;   DIALYSIS/PERMA CATHETER REMOVAL N/A 08/15/2019   Procedure: DIALYSIS/PERMA CATHETER REMOVAL;  Surgeon: Katha Cabal, MD;  Location: [place] [facility];  Service: Cardiovascular;  Laterality: N/A;   EMBOLIZATION Right 02/06/2021   Procedure: EMBOLIZATION;  Surgeon: Algernon Huxley, MD;  Location: [place] [facility] [place] [facility];  Service: Cardiovascular;  Laterality: Right;  Right Upper Extremity Dialysis Access, Permcath Placement   ESOPHAGOGASTRODUODENOSCOPY (EGD) WITH PROPOFOL N/A 12/27/2017   Procedure: ESOPHAGOGASTRODUODENOSCOPY (EGD) WITH PROPOFOL;  Surgeon: [place], Benay Pike, MD;  Location: [name]OSCOPY;  Service: Gastroenterology;  Laterality: N/A;   EYE SURGERY     INTRAVASCULAR ULTRASOUND/IVUS N/A 02/26/2020   Procedure: Intravascular Ultrasound/IVUS;  Surgeon: Jettie Booze, MD;  Location: [facility] [facility];  Service: Cardiovascular;  Laterality: N/A;   LEFT HEART CATH AND CORONARY ANGIOGRAPHY N/A 02/26/2020   Procedure: LEFT HEART CATH AND CORONARY ANGIOGRAPHY;  Surgeon: Jettie Booze, MD;  Location: [place] [facility];  Service: Cardiovascular;  Laterality: N/A;   PROSTATE SURGERY  2016   TONSILECTOMY/ADENOIDECTOMY WITH MYRINGOTOMY     TONSILLECTOMY     UPPER EXTREMITY ANGIOGRAPHY Right 11/26/2020   Procedure: Upper Extremity Angiography;  Surgeon: Algernon Huxley, MD;  Location: [place] [facility];  Service: Cardiovascular;  Laterality: Right;   UPPER EXTREMITY ANGIOGRAPHY Right 02/05/2021   Procedure: UPPER EXTREMITY ANGIOGRAPHY;  Surgeon: Algernon Huxley, MD;  Location: [facility] [facility];  Service: Cardiovascular;  Laterality: Right;     Allergies  Allergies  Allergen Reactions   Promethazine Diarrhea and Other (See Comments)    Muscle cramps   Promethazine Hcl Other (See Comments)    Cramping all over  Other reaction(s): cramp all over   Other     Other reaction(s): Unknown Other reaction(s): Unknown    Malt Other (See Comments)    Cramping    History of Present Illness     52 y.o. male with a history of CAD s/p ant STEMI in 02/2020 w/ DES  LAD, HTN, HL, DMII, PAD, PAF (no longer on [place]), strokes, cerebral hemorrhage (10/2021 in setting of HTN emergency, NOAC, and APT), chronic HFpEF, large pericardial effusion (11/2021), ESRD on HD, sz d/o, and Ao dilatation (25mm 11/2021 echo).  As noted, he presented in 02/2020 w/ chest pain and anterior ST elevation and was subsequently found to have severe LAD dzs and otw nonobs CAD.  The LAD was successfully treated w/ a DES.  He had PAF during hospitalization and was managed w/ amio and eliquis.  In 01/2021, he suffered a CVA (l hippocampus) while holding [place] for amputation of R ring finger.  He had recurrent strokes in April and May 2023 w/ chronic infarct of bilat high frontal lobes and R parietal lobe.  He has since had seizures managed w/ keppra.  In June 2023 he suffered seizures and cardiac arrest in the setting of hyperkalemia and missing HD.  Notes indicate that he briefly required CPR.  In late Sept 2023, he was admitted w/ malaise, h/a, n/v w/ finding of systolic BP in the 983'J and intracranial hemorrhage of 4th ventricle.  Echo showed EF of 65-70% w/ large circumferential pericardial effusion w/o tamponade.  Plavix and eliquis were d/c'd and adjustments were made to antihypertensive regimen.  Since his hospitalization in September, he notes that his activity has been more limited.  He does ambulate using a walker and says in general, he does not experience any limitations but he does struggle going up stairs due to leg weakness.  He does not experience chest pain or dyspnea on exertion.  He denies palpitations, PND, orthopnea, dizziness, syncope, or early satiety.  He does experience mild ankle edema.  He was seen by podiatry on 1/5 in the setting of r foot pain w/ diabetic ulcer of the 5th toe.  Osteomyelitis was present and he was sent to the ED for  admission, vasc w/u, and amputation.  He currently has no complaints.  It is his understanding that he will have surgery later today.  Inpatient Medications     calcium acetate  1,334 mg Oral TID WC   carvedilol  25 mg Oral BID   insulin aspart  0-6 Units Subcutaneous Q4H   irbesartan  300 mg Oral Daily   levETIRAcetam  750 mg Oral 2 times per day on Tue Thu Sat   [START ON 02/28/2022] levETIRAcetam  750 mg Oral Once per day on Sun Mon Wed Fri    Family History    Family History  Problem Relation Age  of Onset   CAD Father    Stroke Father    Diabetes Mellitus II Mother    Kidney failure Mother    Schizophrenia Mother    He indicated that his mother is alive. He indicated that his father is deceased. He indicated that his sister is alive. He indicated that his brother is deceased.   Social History    Social History   Socioeconomic History   Marital status: Married    Spouse name: [name]    Number of children: 2   Years of education: Not on file   Highest education level: High school graduate  Occupational History   Occupation: Disability    Comment: not employed  Tobacco Use   Smoking status: Never   Smokeless tobacco: Never  Vaping Use   Vaping Use: Never used  Substance and Sexual Activity   Alcohol use: No   Drug use: No   Sexual activity: Yes  Other Topics Concern   Not on file  Social History Narrative   Oldest son killed in car crash June 2020.  Lives in [place] with his wife.   Social Determinants of Health   Financial Resource Strain: High Risk (07/05/2017)   Overall Financial Resource Strain (CARDIA)    Difficulty of Paying Living Expenses: Hard  Food Insecurity: No Food Insecurity (11/26/2021)   Hunger Vital Sign    Worried About Running Out of Food in the Last Year: Never true    Ran Out of Food in the Last Year: Never true  Transportation Needs: No Transportation Needs (11/26/2021)   PRAPARE - Hydrologist  (Medical): No    Lack of Transportation (Non-Medical): No  Physical Activity: Inactive (07/05/2017)   Exercise Vital Sign    Days of Exercise per Week: 0 days    Minutes of Exercise per Session: 0 min  Stress: Stress Concern Present (07/05/2017)   [place]    Feeling of Stress : Rather much  Social Connections: Moderately Integrated (07/05/2017)   Social Connection and Isolation Panel [NHANES]    Frequency of Communication with Friends and Family: Three times a week    Frequency of Social Gatherings with Friends and Family: Once a week    Attends Religious Services: More than 4 times per year    Active Member of [employer] or Organizations: No    Attends Archivist Meetings: Never    Marital Status: Married  Human resources officer Violence: Not At Risk (11/26/2021)   Humiliation, Afraid, Rape, and Kick questionnaire    Fear of Current or Ex-Partner: No    Emotionally Abused: No    Physically Abused: No    Sexually Abused: No     Review of Systems    General:  No chills, fever, night sweats or weight changes.  Cardiovascular:  No chest pain, dyspnea on exertion, edema, orthopnea, palpitations, paroxysmal nocturnal dyspnea. Dermatological: No rash, lesions/masses Respiratory: No cough, dyspnea Urologic: No hematuria, dysuria Abdominal:   No nausea, vomiting, diarrhea, bright red blood per rectum, melena, or hematemesis Neurologic:  No visual changes, wkns, changes in mental status. MSK:  leg wkns - uses walker.  R foot pain. All other systems reviewed and are otherwise negative except as noted above.  Physical Exam    Blood pressure (!) 168/77, pulse 62, temperature 99.3 F (37.4 C), resp. rate 16, height 6\' 3"  (1.905 m), weight 81.6 kg, SpO2 100 %.  General: Pleasant, NAD  Psych: Normal affect. Neuro: Alert and oriented X 3. Moves all extremities spontaneously. HEENT: Normal  Neck: Supple without bruits or  JVD. Lungs:  Resp regular and unlabored, CTA. Heart: RRR no s3, s4, or murmurs. Abdomen: Soft, non-tender, non-distended, BS + x 4.  Extremities: No clubbing, cyanosis or edema. DP/PT2+, Radials 2+ and equal bilaterally. LUE AVF w/ bruit/thrill.  Labs    Lab Results  Component Value Date   WBC 6.5 02/26/2022   HGB 8.1 (L) 02/27/2022   HCT 27.4 (L) 02/26/2022   MCV 72.1 (L) 02/26/2022   PLT 154 02/26/2022    Recent Labs  Lab 02/26/22 1445  NA 137  K 4.1  CL 105  CO2 22  BUN 40*  CREATININE 6.62*  CALCIUM 8.4*  PROT 6.7  BILITOT 0.8  ALKPHOS 78  ALT 12  AST 13*  GLUCOSE 76   Lab Results  Component Value Date   CHOL 94 11/21/2021   HDL 39 (L) 11/21/2021   LDLCALC 34 11/21/2021   TRIG 124 11/26/2021   Radiology Studies    CT Angio Aortobifemoral W and/or Wo Contrast  Result Date: 02/26/2022 CLINICAL DATA:  Peripheral arterial disease, osteomyelitis EXAM: CT ANGIOGRAPHY OF ABDOMINAL AORTA WITH ILIOFEMORAL RUNOFF TECHNIQUE: Multidetector CT imaging of the abdomen, pelvis and lower extremities was performed using the standard protocol during bolus administration of intravenous contrast. Multiplanar CT image reconstructions and MIPs were obtained to evaluate the vascular anatomy. RADIATION DOSE REDUCTION: This exam was performed according to the departmental dose-optimization program which includes automated exposure control, adjustment of the mA and/or kV according to patient size and/or use of iterative reconstruction technique. CONTRAST:  155mL OMNIPAQUE IOHEXOL 350 MG/ML SOLN COMPARISON:  07/18/2017 FINDINGS: VASCULAR Coronary calcifications. Aorta: Minimal calcified atheromatous plaque. No aneurysm, dissection, or stenosis. Celiac: Widely patent. Left gastric artery arises directly from the aorta, an anatomic variant SMA: Patent without evidence of aneurysm, dissection, vasculitis or significant stenosis. Renals: Both renal arteries are patent without evidence of aneurysm,  dissection, vasculitis, fibromuscular dysplasia or significant stenosis. IMA: Patent without evidence of aneurysm, dissection, vasculitis or significant stenosis. RIGHT Lower Extremity Inflow: Common iliac: Scattered calcified plaque; no aneurysm, dissection, or significant stenosis. Internal iliac: Mildly atheromatous, patent. External iliac: Patent. Outflow: Common femoral: Patent. Deep femoral branches patent. SFA scattered calcified plaque without stenosis. Popliteal moderate calcified plaque below the knee, without significant stenosis. Runoff: Evaluation limited due to poor scan timing. There is heavy medial calcification. LEFT Lower Extremity Inflow: Common iliac minimally atheromatous, patent. Internal iliac mildly atheromatous, patent External iliac patent. Outflow: Common femoral patent. Deep femoral branches patent. SFA scattered calcified plaque, no stenosis or occlusion. Popliteal moderate calcified plaque without significant stenosis. Runoff: Limited due to poor arterial opacification, and heavy medial calcification in trifurcation runoff vessels. Veins: Limited venous opacification on this arterial phase study. Curvilinear calcifications in bilateral popliteal and femoral veins suggesting stigmata of previous thrombosis. Patent splenic and portal veins. Review of the MIP images confirms the above findings. NON-VASCULAR Lower chest: Moderate pericardial effusion. No pleural effusion. Minimal atelectasis in the visualized lung bases. Hepatobiliary: No focal liver abnormality is seen. No gallstones, gallbladder wall thickening, or biliary dilatation. Pancreas: Unremarkable. No pancreatic ductal dilatation or surrounding inflammatory changes. Spleen: Normal in size without focal abnormality. Adrenals/Urinary Tract: No adrenal mass. Symmetric renal enhancement without focal lesion or hydronephrosis. Nondistended urinary bladder with wall thickening. Stomach/Bowel: Stomach is incompletely distended,  unremarkable. Small bowel decompressed. Normal appendix. The colon is incompletely distended. Circumferential wall thickening in  the rectum with mild regional inflammatory/edematous change. Lymphatic: No abdominal or pelvic adenopathy. Reproductive: Prostate unremarkable. Other: No ascites.  No free air. Musculoskeletal: Cortical destruction involving proximal and middle phalanges of the right fifth toe of uncertain chronicity. 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 Subchondral destructive changes at the left second MTP joint. IMPRESSION: 1. No significant aortoiliac or femoropopliteal occlusive disease. 2. Limited evaluation of bilateral tibial runoff due to poor arterial opacification and heavy medial calcification. 3. Moderate pericardial effusion. 4. Wall thickening in the rectum suggesting proctitis. 5. Destructive changes in the right fifth toe and left second MTP joint of uncertain chronicity. 6. Coronary and aortic Atherosclerosis (ICD10-I70.0). Electronically Signed   By: Lucrezia Europe M.D.   On: 02/26/2022 19:02   DG Foot Complete Right  Result Date: 02/26/2022 CLINICAL DATA:  Chronic wound infection.  Concern for osteomyelitis. EXAM: RIGHT FOOT COMPLETE - 3+ VIEW COMPARISON:  02/26/2022, earlier today. FINDINGS: There is diffuse soft tissue edema. Extensive vascular calcifications identified. Signs of bony destruction involving the head of the fifth proximal phalanx, fifth middle phalanx and fifth distal phalanx. Imaging findings are compatible with underlying acute osteomyelitis. No additional areas of bone erosion identified. No acute fracture. IMPRESSION: 1. Signs of acute osteomyelitis involving the head of the fifth proximal phalanx, fifth middle phalanx and fifth distal phalanx. 2. Diffuse soft tissue edema. 3. Extensive vascular calcifications. Electronically Signed   By: Kerby Moors M.D.   On: 02/26/2022 18:58   ECG & Cardiac Imaging    RSR, 71, LVH, prolonged QT - personally  reviewed.  Assessment & Plan    1.  Preoperative cardiovascular examination/CAD: Patient with a history of CAD status post anterior ST segment elevation myocardial infarction in January 2022 status post drug-eluting stent placement to the LAD.  He otherwise had nonobstructive disease.  He had been maintained on Plavix and Eliquis in the setting of paroxysmal atrial fibrillation and prior strokes, but has been off of both in the setting of intracranial hemorrhage of the left fourth ventricle in late September 2023.  Activity has been limited since his hospitalization in September but he is able to ambulate with a walker without symptoms or limitations.  Peak achievable metabolic equivalents appears to be 5.25 (sexual relations).  He goes to dialysis 3 days a week and tolerates well without chest pain or dyspnea.  He is now pending amputation involving his right foot in the setting of osteomyelitis of the right fifth toe and diabetic foot ulcer.  I have placed an order for a limited echocardiogram to reevaluate large pericardial effusion noted on echo in October 2023.  Provided stability without tamponade, he is low risk for proceeding with amputation and should not require any additional ischemic evaluation.  Continue beta-blocker and statin therapy throughout the perioperative period.  He is not on any antiplatelet therapy in the setting of intracranial bleed in September.  2.  Osteomyelitis of the right fifth toe/diabetic foot ulcer: Pending amputation.  Per podiatry, vascular surgery, and internal medicine.  See above.  3.  Chronic HFpEF: Normal LV function by echo in October 2023. Management per hemodialysis 3 times a week.  Currently hypertensive.  Resume home medications and follow-up.  4.  Large pericardial effusion: Noted on echocardiogram in October 2023.  Has not had a follow-up.  Denies chest pain or dyspnea with activity somewhat limited.  No rub on exam.  Follow-up limited echo to ensure  stability.  5.  Essential hypertension: As outlined above, blood  pressure elevated in the emergency department.  He has not received any of his usual home medications.  Resume and follow.  6.  Hyperlipidemia: LDL of 34 in September.  Continue atorvastatin therapy.  7.  Paroxysmal atrial fibrillation: Diagnosed at the time of his STEMI in January 2022.  Maintaining sinus rhythm.  Off of oral anticoagulation in the setting of intracranial bleed in September 2023.  On amiodarone as outpatient the QTc is long at 510 ms.  Reasonable to hold for now.  Follow-up magnesium.  K nl.  Cont ? blocker.  8.  End-stage renal disease: Per nephrology.  9.  Type 2 diabetes mellitus: Per medicine team.  10.  Seizure disorder: Managed with Keppra.  Sees neurology as an outpatient.  No recent seizures.  11.  History of intracranial hemorrhage: In September 2023 in the setting of hypertensive urgency, antiplatelet therapy and Eliquis.  No longer on anticoagulation.  12.  Acute on chronic microcytic anemia: Per internal medicine.  Low threshold to transfuse in the setting of known CAD.  13.  Prolonged QT: Amiodarone currently on hold.  Follow-up magnesium.  Potassium normal.  Risk Assessment/Risk Scores:           Signed, Murray Hodgkins, NP 02/27/2022, 8:49 AM  For questions or updates, please contact   Please consult [email or web address] for contact info under Cardiology/STEMI.

## 2022-02-27 NOTE — H&P (View-Only) (Signed)
Subjective:  Patient ID: [name]., male    DOB: Feb 13, 1971,  MRN: [id number]  Patient with past medical history of ESRD on HD, PAD and osteomyelitis in finger, CVA, CAD, STEMI, HFpEF seen at beside today for concern of right fith digit wound and osteomyelitis. Was seen in the office by [name] and advised to head to ED. X-rays were concerned for osteomyelitis and history of PAD needing vascular workup Patient relates feeling ok and not too much pain .   Past Medical History:  Diagnosis Date   Anxiety    a.) on BZO (clonazepam) PRN   Aortic dilatation ([place]) 11/25/2020   a.) TTE 11/25/2020: Ao root measured 38 mm. b.) TTE 04/22/2021: Ao root measured 44 mm; c. 11/2021 Echo: Ao root 66mm.   Atrial fibrillation ([place])    a.) CHA2DS2-VASc = 6 (CHF, HTN, CVA x2, prior MI, T2DM). b.) rate/rhythm maintained on oral amiodarone + carvedilol; chronically anticoagulated using dose reduced apixaban + clopidogrel   BPH (benign prostatic hyperplasia)    Cardiac arrest ([place]) 07/26/2021   a.) in setting of hyperkalemia, NSTEMI, and seizure following missed HD; pulseless and apneic --> required 1 round of CPR prior to ROSC   Cellulitis    Cerebral hemorrhage ([place])    a. 10/2021 interventricular hemorrhage in setting of HTN emergency and DOAC/APT-->eliquis/plavix d/c'd.   Chronic diarrhea    Chronic heart failure with preserved ejection fraction (HFpEF) ([place]) 07/19/2017   a. 06/2017 Echo: EF 75%; b. 11/2020 Echo: EF 55-60%; c. 04/2021 Echo: EF 55-60%; d. 11/2021 Echo: EF 65-70%, GrII DD, nl RV fxn, sev dil LA, large circumferential pericardial eff w/o tamponade, triv MR, AoV sclerosis. Ao root 8mm.   Chronic pain of both ankles    Coronary artery disease 02/26/2020   a. 02/2020 Ant STEMI/PCI: LM nl, LAD 46m (3.5x30 Resolute Onyx), RI nl, LCX min irregs, RCA min irregs. EF 65%.   Diabetes mellitus, type II ([place])    Diabetic neuropathy ([place])    Esophagitis    ESRD (end stage renal disease) on  dialysis ([place])    a.) [facility]; T-Th-Sat   Frequent falls    Gait instability    Gastritis    Gastroparesis    GERD (gastroesophageal reflux disease)    H/O enucleation of left eyeball    Heart palpitations    History of kidney stones    HLD (hyperlipidemia)    Hypertension    Insomnia    a.) uses melatonin PRN   Nausea and vomiting in adult    recurrent   NSTEMI (non-ST elevated myocardial infarction) ([place]) 07/26/2021   a.) hyperkalemic from missed HD; seizures; decompensated to cardiac arrest and required CPR x 1 round prior to ROSC.   Osteomyelitis (HCC)    Pericardial effusion    a. 11/2021 Echo: EF 65-70%, GrII DD, nl RV fxn, sev dil LA, large circumferential pericardial eff w/o tamponade, triv MR, AoV sclerosis. Ao root 80mm.   Seizure ([place])    a.) last 07/26/2021 in setting of missed HD --> hyperkalemic at 6.5 --> pulseless/apneic and required CPR; discharged home on levetiracetam.   ST elevation myocardial infarction (STEMI) of anterior wall ([place]) 02/26/2020   a.) LHC/PCI 02/26/2020 --> EF 65%; LVEDP 11 mmHg; 90% mLAD (3.5 x 20 mm Resolute Onyx DES x 1)   Stroke ([place]) 04/2021   a.) documented as x 3 between 04/2021 and 06/2021     Past Surgical History:  Procedure Laterality Date   A/V FISTULAGRAM Left 08/31/2021  Procedure: A/V Fistulagram;  Surgeon: Algernon Huxley, MD;  Location: [place] [facility];  Service: Cardiovascular;  Laterality: Left;   A/V FISTULAGRAM Left 01/25/2022   Procedure: A/V Fistulagram;  Surgeon: Algernon Huxley, MD;  Location: [place] [facility];  Service: Cardiovascular;  Laterality: Left;   AMPUTATION Left 03/16/2016   Procedure: AMPUTATION DIGIT LEFT HALLUX;  Surgeon: Trula Slade, DPM;  Location: [facility];  Service: Podiatry;  Laterality: Left;  can start around 5    AMPUTATION Right 02/09/2021   Procedure: AMPUTATION DIGIT;  Surgeon: Sherilyn Cooter, MD;  Location: [place] [facility];  Service: Orthopedics;  Laterality: Right;   ARTHROSCOPIC REPAIR ACL  Left    AV FISTULA PLACEMENT Right 05/31/2019   Procedure: Brachiocephalic AV fistula creation;  Surgeon: Algernon Huxley, MD;  Location: [name];  Service: Vascular;  Laterality: Right;   AV FISTULA PLACEMENT Left 08/13/2021   Procedure: ARTERIOVENOUS (AV) FISTULA CREATION (BRACHIALCEPHALIC);  Surgeon: Algernon Huxley, MD;  Location: [name];  Service: Vascular;  Laterality: Left;   COLONOSCOPY WITH PROPOFOL N/A 10/28/2015   Procedure: COLONOSCOPY WITH PROPOFOL;  Surgeon: Lollie Sails, MD;  Location: [facility];  Service: Endoscopy;  Laterality: N/A;   COLONOSCOPY WITH PROPOFOL N/A 10/29/2015   Procedure: COLONOSCOPY WITH PROPOFOL;  Surgeon: Lollie Sails, MD;  Location: [facility];  Service: Endoscopy;  Laterality: N/A;   CORONARY/GRAFT ACUTE MI REVASCULARIZATION N/A 02/26/2020   Procedure: Coronary/Graft Acute MI Revascularization;  Surgeon: Jettie Booze, MD;  Location: [place] [facility];  Service: Cardiovascular;  Laterality: N/A;   DIALYSIS/PERMA CATHETER INSERTION Right 04/26/2019   Perm Cath    DIALYSIS/PERMA CATHETER INSERTION N/A 04/26/2019   Procedure: DIALYSIS/PERMA CATHETER INSERTION;  Surgeon: Algernon Huxley, MD;  Location: [place] [facility];  Service: Cardiovascular;  Laterality: N/A;   DIALYSIS/PERMA CATHETER INSERTION N/A 05/25/2021   Procedure: DIALYSIS/PERMA CATHETER INSERTION;  Surgeon: Algernon Huxley, MD;  Location: [place] [facility];  Service: Cardiovascular;  Laterality: N/A;   DIALYSIS/PERMA CATHETER INSERTION N/A 08/31/2021   Procedure: DIALYSIS/PERMA CATHETER INSERTION;  Surgeon: Algernon Huxley, MD;  Location: [place] [facility];  Service: Cardiovascular;  Laterality: N/A;   DIALYSIS/PERMA CATHETER REMOVAL N/A 08/15/2019   Procedure: DIALYSIS/PERMA CATHETER REMOVAL;  Surgeon: Katha Cabal, MD;  Location: [place] [facility];  Service: Cardiovascular;  Laterality: N/A;   EMBOLIZATION Right 02/06/2021   Procedure: EMBOLIZATION;  Surgeon: Algernon Huxley, MD;  Location: [place] [facility];  Service: Cardiovascular;  Laterality: Right;  Right Upper Extremity Dialysis Access, Permcath Placement   ESOPHAGOGASTRODUODENOSCOPY (EGD) WITH PROPOFOL N/A 12/27/2017   Procedure: ESOPHAGOGASTRODUODENOSCOPY (EGD) WITH PROPOFOL;  Surgeon: Toledo, Benay Pike, MD;  Location: [name] ENDOSCOPY;  Service: Gastroenterology;  Laterality: N/A;   EYE SURGERY     INTRAVASCULAR ULTRASOUND/IVUS N/A 02/26/2020   Procedure: Intravascular Ultrasound/IVUS;  Surgeon: Jettie Booze, MD;  Location: [place] [facility];  Service: Cardiovascular;  Laterality: N/A;   LEFT HEART CATH AND CORONARY ANGIOGRAPHY N/A 02/26/2020   Procedure: LEFT HEART CATH AND CORONARY ANGIOGRAPHY;  Surgeon: Jettie Booze, MD;  Location: [place] [facility];  Service: Cardiovascular;  Laterality: N/A;   PROSTATE SURGERY  2016   TONSILECTOMY/ADENOIDECTOMY WITH MYRINGOTOMY     TONSILLECTOMY     UPPER EXTREMITY ANGIOGRAPHY Right 11/26/2020   Procedure: Upper Extremity Angiography;  Surgeon: Algernon Huxley, MD;  Location: [place] [facility];  Service: Cardiovascular;  Laterality: Right;   UPPER EXTREMITY ANGIOGRAPHY Right 02/05/2021  Procedure: UPPER EXTREMITY ANGIOGRAPHY;  Surgeon: Algernon Huxley, MD;  Location: [place] [facility];  Service: Cardiovascular;  Laterality: Right;       Latest Ref Rng & Units 02/27/2022   12:55 AM 02/26/2022    2:45 PM 01/30/2022    7:34 AM  CBC  WBC 4.0 - 10.5 K/uL  6.5  6.3   Hemoglobin 13.0 - 17.0 g/dL 8.1  7.8  9.3   Hematocrit 39.0 - 52.0 %  27.4  30.9   Platelets 150 - 400 K/uL  154  203        Latest Ref Rng & Units 02/26/2022    2:45 PM 01/30/2022    7:34 AM 01/09/2022    6:12 PM  BMP  Glucose 70 - 99 mg/dL 76  90  130   BUN 6 - 20 mg/dL 40  88  28   Creatinine 0.61 - 1.24 mg/dL 6.62  10.89  6.28   Sodium 135 - 145 mmol/L 137  142  137   Potassium 3.5 - 5.1 mmol/L 4.1  5.0  3.6   Chloride 98 - 111 mmol/L 105  113  106   CO2 22 - 32  mmol/L 22  18  23    Calcium 8.9 - 10.3 mg/dL 8.4  8.5  7.9      Objective:   Vitals:   02/27/22 0615 02/27/22 0630  BP:    Pulse: 68 62  Resp: 15 16  Temp:    SpO2: 95% 100%    General:AA&O x 3. Normal mood and affect   Vascular: DP and PT non palpable.  Slowed capillary refill to all digits. Pedal hair present   Neruological. Epicritic sensation grossly intact.   Derm: Ladean Raya rightl fifth digit wound wound with necrotic base measuring 1cm. No erythema or edema present. No purulence. Does probe to bone. Interspaces clears of maceration. Nails well groomed and normal in appearance  MSK: MMT 5/5 in dorsiflexion, plantar flexion, inversion and eversion. Normal joint ROM without pain or crepitus.       Assessment & Plan:  Patient was evaluated and treated and all questions answered.  DX: Right fifth digit osteomyeliits  Wound care: betadine, DSD  Antibiotics: Per primary  DME: post-op shoe  Discussed with patient diagnosis and treatment options.  Imaging reviewed. Radiographs showing destruction of proximal fifth digit phalanx indicating osteomyelitis Discussed infection in toe with patient and need for removal of the infected bone. Patient understanding and in agreement. Discussed we will need to make sure he is optimized from a vascular standpoint and clear for surgery from cardiology.   Patient in agreement with plan and all questions answered.  Awaiting clearance but patient will need right fifth digit amputation. If clear from other services will likely take to OR tomorrow if not will await recommendations.   Lorenda Peck, DPM  Accessible via secure chat for questions or concerns.

## 2022-02-27 NOTE — Progress Notes (Signed)
Patient refusing q4 CBG monitoring. He states "I am not a diabetic." NPO at midnight. Pt educated on blood glucose monitoring during NPO status. Neomia Glass, NP made aware.

## 2022-02-27 NOTE — Progress Notes (Signed)
Progress Note    [name].  GBT:[id number] DOB: 1971-01-13  DOA: 02/26/2022 PCP: [name], MD      Brief Narrative:    Medical records reviewed and are as summarized below:  [name]. is a 52 y.o. male with medical history significant for CAD, STEMI in July 2022 s/p drug-eluting stent to LAD, hypertension, hyperlipidemia, type II DM, peripheral artery disease, ESRD on hemodialysis, paroxysmal atrial fibrillation (no longer on Eliquis) stroke, intracranial hemorrhage in September 2023 in the setting of hypertensive emergency, Eliquis and antiplatelet therapy) chronic diastolic CHF, large pericardial effusion in October 2023, seizure disorder, aortic dilatation (40 mm in October 2023 by echo), left eye enucleation.  [name] presented to the hospital because of right toe wound infection at the behest of [name], podiatrist..   [name] was admitted to the hospital for acute osteomyelitis of right fifth toe in the setting of peripheral vascular disease.       Assessment/Plan:   Principal Problem:   Acute osteomyelitis of toe of right foot with gangrene [facility]) Active Problems:   Preoperative clearance   Acute on chronic anemia   Paroxysmal atrial fibrillation (HCC)   Chronic Grade 2 diastolic heart failure/EF 55 to 60%   CAD/ H/o prior STEMI 02/2020 s/p DES to LAD   ESRD on hemodialysis [facility])   H/O spontaneous intraventricular intracranial hemorrhage 10/2021   History of CVA (cerebrovascular accident)   Essential hypertension, benign   Diabetes mellitus, type II ([place])   H/O enucleation of left eyeball   PVD (peripheral vascular disease) ([place])   History of seizure    Body mass index is 24.19 kg/m.   Right fifth toe osteomyelitis in the setting of peripheral vascular disease: Continue with IV antibiotics.  Plan for right fifth digit amputation.  Follow-up with podiatrist   ESRD on T,T&S: Plan for hemodialysis today.   Paroxysmal atrial  fibrillation: Continue carvedilol.  [name] is no longer on anticoagulation because of history of intracranial bleed in September 2023  CAD, previous therapy in July 2022 s/p DES to LAD: [name] is not on antiplatelet therapy because of previous intracranial hemorrhage.   Acute on chronic anemia: No evidence of blood loss at this time.  This is likely from anemia of chronic disease.  Monitor H&H and transfuse as needed.   Prolonged QT interval: Amiodarone on hold.    Other comorbidities include seizure disorder on Keppra, history of stroke, history of intracranial hemorrhage, chronic diastolic CHF, large pericardial effusion without tamponade type II DM, hypertension     Diet Order             Diet NPO time specified  Diet effective midnight           Diet renal with fluid restriction Fluid restriction: 1200 mL Fluid; Room service appropriate? Yes; Fluid consistency: Thin  Diet effective now                            Consultants: Nephrologist Podiatrist Vascular surgeon Cardiologist  Procedures: None    Medications:    atorvastatin  80 mg Oral Daily   calcium acetate  1,334 mg Oral TID WC   carvedilol  25 mg Oral BID   Chlorhexidine Gluconate Cloth  6 each Topical Q0600   insulin aspart  0-6 Units Subcutaneous Q4H   irbesartan  300 mg Oral Daily   levETIRAcetam  750 mg Oral 2 times per day on Tue  Thu Sat   [START ON 02/28/2022] levETIRAcetam  750 mg Oral Once per day on Sun Mon Wed Fri   Continuous Infusions:  anticoagulant sodium citrate     ceFEPime (MAXIPIME) IV     vancomycin       Anti-infectives (From admission, onward)    Start     Dose/Rate Route Frequency Ordered Stop   02/27/22 1700  ceFEPIme (MAXIPIME) 1 g in sodium chloride 0.9 % 100 mL IVPB        1 g 200 mL/hr over 30 Minutes Intravenous Every 24 hours 02/26/22 2138     02/27/22 1200  vancomycin (VANCOCIN) IVPB 1000 mg/200 mL premix        1,000 mg 200 mL/hr over 60 Minutes Intravenous  Every T-Th-Sa (Hemodialysis) 02/27/22 0133     02/26/22 1700  vancomycin (VANCOREADY) IVPB 1500 mg/300 mL        1,500 mg 150 mL/hr over 120 Minutes Intravenous  Once 02/26/22 1624 02/26/22 2130   02/26/22 1615  ceFEPIme (MAXIPIME) 2 g in sodium chloride 0.9 % 100 mL IVPB        2 g 200 mL/hr over 30 Minutes Intravenous  Once 02/26/22 1608 02/26/22 1726   02/26/22 1615  vancomycin (VANCOCIN) IVPB 1000 mg/200 mL premix  Status:  Discontinued        1,000 mg 200 mL/hr over 60 Minutes Intravenous  Once 02/26/22 1608 02/26/22 1623              Family Communication/Anticipated D/C date and plan/Code Status   DVT prophylaxis: SCDs Start: 02/26/22 2130     Code Status: Full Code  Family Communication: None Disposition Plan: Plan to discharge home in 2 to 3 days   Status is: Inpatient Remains inpatient appropriate because: Osteomyelitis right foot       Subjective:   Interval events noted.  [name] complains of pain in the right fifth toe..  Objective:    Vitals:   02/27/22 1300 02/27/22 1330 02/27/22 1400 02/27/22 1430  BP: (!) 152/76 (!) 154/77 (!) 169/79 (!) 169/79  Pulse: (!) 56 (!) 58 (!) 58 (!) 58  Resp: 19 14 11 11   Temp:      TempSrc:      SpO2: 100% 100% 100% 99%  Weight:      Height:       No data found.  No intake or output data in the 24 hours ending 02/27/22 1448 Filed Weights   02/27/22 0123 02/27/22 1222  Weight: 81.6 kg 87.8 kg    Exam:  GEN: NAD SKIN: No rash EYES: EOMI ENT: MMM CV: RRR PULM: CTA B ABD: soft, ND, NT, +BS CNS: AAO x 3, non focal EXT: Right leg and pedal edema.  Right fifth toe wound     Media Information       Data Reviewed:   I have personally reviewed following labs and imaging studies:  Labs: Labs show the following:   Basic Metabolic Panel: Recent Labs  Lab 02/26/22 1445 02/27/22 1236  NA 137  --   K 4.1  --   CL 105  --   CO2 22  --   GLUCOSE 76  --   BUN 40*  --   CREATININE 6.62*  --    CALCIUM 8.4*  --   MG  --  1.9   GFR Estimated Creatinine Clearance: 15.2 mL/min (A) (by C-G formula based on SCr of 6.62 mg/dL (H)). Liver Function Tests: Recent Labs  Lab 02/26/22  1445  AST 13*  ALT 12  ALKPHOS 78  BILITOT 0.8  PROT 6.7  ALBUMIN 3.3*   No results for input(s): "LIPASE", "AMYLASE" in the last 168 hours. No results for input(s): "AMMONIA" in the last 168 hours. Coagulation profile No results for input(s): "INR", "PROTIME" in the last 168 hours.  CBC: Recent Labs  Lab 02/26/22 1445 02/27/22 0055 02/27/22 1236  WBC 6.5  --  5.4  NEUTROABS 3.5  --   --   HGB 7.8* 8.1* 7.7*  HCT 27.4*  --  26.6*  MCV 72.1*  --  71.5*  PLT 154  --  137*   Cardiac Enzymes: No results for input(s): "CKTOTAL", "CKMB", "CKMBINDEX", "TROPONINI" in the last 168 hours. BNP (last 3 results) No results for input(s): "PROBNP" in the last 8760 hours. CBG: No results for input(s): "GLUCAP" in the last 168 hours. D-Dimer: No results for input(s): "DDIMER" in the last 72 hours. Hgb A1c: No results for input(s): "HGBA1C" in the last 72 hours. Lipid Profile: No results for input(s): "CHOL", "HDL", "LDLCALC", "TRIG", "CHOLHDL", "LDLDIRECT" in the last 72 hours. Thyroid function studies: No results for input(s): "TSH", "T4TOTAL", "T3FREE", "THYROIDAB" in the last 72 hours.  Invalid input(s): "FREET3" Anemia work up: Recent Labs    02/26/22 1445 02/27/22 0055  VITAMINB12  --  344  FOLATE 6.1  --   FERRITIN 206  --   TIBC 172*  --   IRON 40*  --   RETICCTPCT 2.7  --    Sepsis Labs: Recent Labs  Lab 02/26/22 1445 02/26/22 1722 02/27/22 1236  WBC 6.5  --  5.4  LATICACIDVEN 0.8 0.7  --     Microbiology Recent Results (from the past 240 hour(s))  Blood culture (routine x 2)     Status: None (Preliminary result)   Collection Time: 02/26/22  5:22 PM   Specimen: BLOOD  Result Value Ref Range Status   Specimen Description BLOOD BLOOD RIGHT ARM  Final   Special Requests    Final    BOTTLES DRAWN AEROBIC AND ANAEROBIC Blood Culture adequate volume   Culture   Final    NO GROWTH < 24 HOURS Performed at [facility], [address], [place], [place] [address]    Report Status PENDING  Incomplete  Blood culture (routine x 2)     Status: None (Preliminary result)   Collection Time: 02/27/22 12:55 AM   Specimen: BLOOD  Result Value Ref Range Status   Specimen Description BLOOD BLOOD RIGHT HAND  Final   Special Requests   Final    BOTTLES DRAWN AEROBIC AND ANAEROBIC Blood Culture results may not be optimal due to an inadequate volume of blood received in culture bottles   Culture   Final    NO GROWTH < 12 HOURS Performed at [facility], [address], [place], [place] [address]    Report Status PENDING  Incomplete    Procedures and diagnostic studies:  CT Angio Aortobifemoral W and/or Wo Contrast  Result Date: 02/26/2022 CLINICAL DATA:  Peripheral arterial disease, osteomyelitis EXAM: CT ANGIOGRAPHY OF ABDOMINAL AORTA WITH ILIOFEMORAL RUNOFF TECHNIQUE: Multidetector CT imaging of the abdomen, pelvis and lower extremities was performed using the standard protocol during bolus administration of intravenous contrast. Multiplanar CT image reconstructions and MIPs were obtained to evaluate the vascular anatomy. RADIATION DOSE REDUCTION: This exam was performed according to the departmental dose-optimization program which includes automated exposure control, adjustment of the mA and/or kV according to patient  size and/or use of iterative reconstruction technique. CONTRAST:  138mL OMNIPAQUE IOHEXOL 350 MG/ML SOLN COMPARISON:  07/18/2017 FINDINGS: VASCULAR Coronary calcifications. Aorta: Minimal calcified atheromatous plaque. No aneurysm, dissection, or stenosis. Celiac: Widely patent. Left gastric artery arises directly from the aorta, an anatomic variant SMA: Patent without evidence of aneurysm, dissection, vasculitis or significant stenosis. Renals:  Both renal arteries are patent without evidence of aneurysm, dissection, vasculitis, fibromuscular dysplasia or significant stenosis. IMA: Patent without evidence of aneurysm, dissection, vasculitis or significant stenosis. RIGHT Lower Extremity Inflow: Common iliac: Scattered calcified plaque; no aneurysm, dissection, or significant stenosis. Internal iliac: Mildly atheromatous, patent. External iliac: Patent. Outflow: Common femoral: Patent. Deep femoral branches patent. SFA scattered calcified plaque without stenosis. Popliteal moderate calcified plaque below the knee, without significant stenosis. Runoff: Evaluation limited due to poor scan timing. There is heavy medial calcification. LEFT Lower Extremity Inflow: Common iliac minimally atheromatous, patent. Internal iliac mildly atheromatous, patent External iliac patent. Outflow: Common femoral patent. Deep femoral branches patent. SFA scattered calcified plaque, no stenosis or occlusion. Popliteal moderate calcified plaque without significant stenosis. Runoff: Limited due to poor arterial opacification, and heavy medial calcification in trifurcation runoff vessels. Veins: Limited venous opacification on this arterial phase study. Curvilinear calcifications in bilateral popliteal and femoral veins suggesting stigmata of previous thrombosis. Patent splenic and portal veins. Review of the MIP images confirms the above findings. NON-VASCULAR Lower chest: Moderate pericardial effusion. No pleural effusion. Minimal atelectasis in the visualized lung bases. Hepatobiliary: No focal liver abnormality is seen. No gallstones, gallbladder wall thickening, or biliary dilatation. Pancreas: Unremarkable. No pancreatic ductal dilatation or surrounding inflammatory changes. Spleen: Normal in size without focal abnormality. Adrenals/Urinary Tract: No adrenal mass. Symmetric renal enhancement without focal lesion or hydronephrosis. Nondistended urinary bladder with wall  thickening. Stomach/Bowel: Stomach is incompletely distended, unremarkable. Small bowel decompressed. Normal appendix. The colon is incompletely distended. Circumferential wall thickening in the rectum with mild regional inflammatory/edematous change. Lymphatic: No abdominal or pelvic adenopathy. Reproductive: Prostate unremarkable. Other: No ascites.  No free air. Musculoskeletal: Cortical destruction involving proximal and middle phalanges of the right fifth toe of uncertain chronicity. 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 Subchondral destructive changes at the left second MTP joint. IMPRESSION: 1. No significant aortoiliac or femoropopliteal occlusive disease. 2. Limited evaluation of bilateral tibial runoff due to poor arterial opacification and heavy medial calcification. 3. Moderate pericardial effusion. 4. Wall thickening in the rectum suggesting proctitis. 5. Destructive changes in the right fifth toe and left second MTP joint of uncertain chronicity. 6. Coronary and aortic Atherosclerosis (ICD10-I70.0). Electronically Signed   By: Lucrezia Europe M.D.   On: 02/26/2022 19:02   DG Foot Complete Right  Result Date: 02/26/2022 CLINICAL DATA:  Chronic wound infection.  Concern for osteomyelitis. EXAM: RIGHT FOOT COMPLETE - 3+ VIEW COMPARISON:  02/26/2022, earlier today. FINDINGS: There is diffuse soft tissue edema. Extensive vascular calcifications identified. Signs of bony destruction involving the head of the fifth proximal phalanx, fifth middle phalanx and fifth distal phalanx. Imaging findings are compatible with underlying acute osteomyelitis. No additional areas of bone erosion identified. No acute fracture. IMPRESSION: 1. Signs of acute osteomyelitis involving the head of the fifth proximal phalanx, fifth middle phalanx and fifth distal phalanx. 2. Diffuse soft tissue edema. 3. Extensive vascular calcifications. Electronically Signed   By: Kerby Moors M.D.   On: 02/26/2022 18:58                LOS: 1 day   Cosby Proby  [employer], advertising on [email or web address]. If 7PM-7AM, please contact night-coverage at [email or web address]     02/27/2022, 2:48 PM

## 2022-02-28 ENCOUNTER — Other Ambulatory Visit: Payer: Self-pay

## 2022-02-28 ENCOUNTER — Inpatient Hospital Stay: Payer: Medicare HMO | Admitting: Certified Registered Nurse Anesthetist

## 2022-02-28 ENCOUNTER — Inpatient Hospital Stay (HOSPITAL_COMMUNITY)
Admit: 2022-02-28 | Discharge: 2022-02-28 | Disposition: A | Discharge status: Home / Self Care | Payer: Medicare HMO | Attending: Nurse Practitioner | Admitting: Nurse Practitioner

## 2022-02-28 ENCOUNTER — Encounter
Admission: EM | Disposition: A | Discharge status: Home / Self Care | Payer: Self-pay | Source: Home / Self Care | Attending: Internal Medicine

## 2022-02-28 DIAGNOSIS — M86671 Other chronic osteomyelitis, right ankle and foot: Secondary | ICD-10-CM

## 2022-02-28 DIAGNOSIS — Z992 Dependence on renal dialysis: Secondary | ICD-10-CM | POA: Diagnosis not present

## 2022-02-28 DIAGNOSIS — N186 End stage renal disease: Secondary | ICD-10-CM | POA: Diagnosis not present

## 2022-02-28 DIAGNOSIS — I5032 Chronic diastolic (congestive) heart failure: Secondary | ICD-10-CM | POA: Diagnosis not present

## 2022-02-28 DIAGNOSIS — M86171 Other acute osteomyelitis, right ankle and foot: Secondary | ICD-10-CM | POA: Diagnosis not present

## 2022-02-28 DIAGNOSIS — I3139 Other pericardial effusion (noninflammatory): Secondary | ICD-10-CM

## 2022-02-28 HISTORY — PX: AMPUTATION TOE: SHX6595

## 2022-02-28 LAB — CBC
HCT: 28.8 % — ABNORMAL LOW (ref 39.0–52.0)
Hemoglobin: 8.4 g/dL — ABNORMAL LOW (ref 13.0–17.0)
MCH: 20.6 pg — ABNORMAL LOW (ref 26.0–34.0)
MCHC: 29.2 g/dL — ABNORMAL LOW (ref 30.0–36.0)
MCV: 70.6 fL — ABNORMAL LOW (ref 80.0–100.0)
Platelets: 130 10*3/uL — ABNORMAL LOW (ref 150–400)
RBC: 4.08 MIL/uL — ABNORMAL LOW (ref 4.22–5.81)
RDW: 28.1 % — ABNORMAL HIGH (ref 11.5–15.5)
WBC: 5.6 10*3/uL (ref 4.0–10.5)
nRBC: 0 % (ref 0.0–0.2)

## 2022-02-28 LAB — HIV ANTIBODY (ROUTINE TESTING W REFLEX): HIV Screen 4th Generation wRfx: NONREACTIVE

## 2022-02-28 LAB — HEPATITIS B SURFACE ANTIBODY, QUANTITATIVE: Hep B S AB Quant (Post): 68.2 m[IU]/mL (ref >9.9)

## 2022-02-28 LAB — ECHOCARDIOGRAM LIMITED
Height: 75 in
S' Lateral: 3.2 cm
Weight: 2998.26 oz

## 2022-02-28 LAB — GLUCOSE, CAPILLARY
Glucose-Capillary: 120 mg/dL — ABNORMAL HIGH (ref 70–99)
Glucose-Capillary: 61 mg/dL — ABNORMAL LOW (ref 70–99)

## 2022-02-28 SURGERY — AMPUTATION, TOE
Anesthesia: General | Site: Toe | Laterality: Right

## 2022-02-28 MED ORDER — DEXTROSE 50 % IV SOLN
INTRAVENOUS | Status: AC
  Filled 2022-02-28: qty 50

## 2022-02-28 MED ORDER — ACETAMINOPHEN 10 MG/ML IV SOLN: 1000.0000 mg | Freq: Once | INTRAVENOUS | Status: DC | PRN

## 2022-02-28 MED ORDER — DEXTROSE 50 % IV SOLN
12.5000 g | INTRAVENOUS | Status: AC
  Administered 2022-02-28: 12.5 g via INTRAVENOUS

## 2022-02-28 MED ORDER — AMLODIPINE BESYLATE 10 MG PO TABS: 10.0000 mg | ORAL_TABLET | Freq: Every day | ORAL | 2 refills | Status: DC

## 2022-02-28 MED ORDER — LIDOCAINE HCL (CARDIAC) PF 100 MG/5ML IV SOSY
PREFILLED_SYRINGE | INTRAVENOUS | Status: DC | PRN
  Administered 2022-02-28: 50 mg via INTRAVENOUS

## 2022-02-28 MED ORDER — 0.9 % SODIUM CHLORIDE (POUR BTL) OPTIME
TOPICAL | Status: DC | PRN
  Administered 2022-02-28: 500 mL

## 2022-02-28 MED ORDER — PROSOURCE PLUS PO LIQD: 30.0000 mL | Freq: Two times a day (BID) | ORAL | Status: DC

## 2022-02-28 MED ORDER — PROPOFOL 500 MG/50ML IV EMUL
INTRAVENOUS | Status: DC | PRN
  Administered 2022-02-28: 150 ug/kg/min via INTRAVENOUS

## 2022-02-28 MED ORDER — FENTANYL CITRATE (PF) 100 MCG/2ML IJ SOLN
INTRAMUSCULAR | Status: AC
  Filled 2022-02-28: qty 2

## 2022-02-28 MED ORDER — LIDOCAINE HCL 1 % IJ SOLN
INTRAMUSCULAR | Status: DC | PRN
  Administered 2022-02-28: 7 mL

## 2022-02-28 MED ORDER — DEXTROSE 50 % IV SOLN: 12.5000 g | INTRAVENOUS | Status: AC

## 2022-02-28 MED ORDER — FENTANYL CITRATE (PF) 100 MCG/2ML IJ SOLN: 25.0000 ug | INTRAMUSCULAR | Status: DC | PRN

## 2022-02-28 MED ORDER — RENA-VITE PO TABS
1.0000 | ORAL_TABLET | Freq: Every day | ORAL | Status: DC
  Filled 2022-02-28: qty 1

## 2022-02-28 MED ORDER — FENTANYL CITRATE (PF) 100 MCG/2ML IJ SOLN
INTRAMUSCULAR | Status: DC | PRN
  Administered 2022-02-28 (×2): 50 ug via INTRAVENOUS

## 2022-02-28 MED ORDER — NEPRO/CARBSTEADY PO LIQD
237.0000 mL | ORAL | Status: DC
  Filled 2022-02-28 (×2): qty 237

## 2022-02-28 MED ORDER — OXYCODONE HCL 5 MG PO TABS: 5.0000 mg | ORAL_TABLET | Freq: Once | ORAL | Status: DC | PRN

## 2022-02-28 MED ORDER — LACTATED RINGERS IV SOLN: INTRAVENOUS | Status: DC

## 2022-02-28 MED ORDER — OXYCODONE HCL 5 MG/5ML PO SOLN: 5.0000 mg | Freq: Once | ORAL | Status: DC | PRN

## 2022-02-28 MED ORDER — SODIUM CHLORIDE 0.9 % IV SOLN: INTRAVENOUS | Status: DC | PRN

## 2022-02-28 SURGICAL SUPPLY — 28 items
BNDG ELASTIC 4X5.8 VLCR STR LF (GAUZE/BANDAGES/DRESSINGS) ×1 IMPLANT
BNDG GAUZE DERMACEA FLUFF 4 (GAUZE/BANDAGES/DRESSINGS) IMPLANT
BNDG GZE DERMACEA 4 6PLY (GAUZE/BANDAGES/DRESSINGS) ×1
CNTNR SPEC 2.5X3XGRAD LEK (MISCELLANEOUS) ×1
CONT SPEC 4OZ STRL OR WHT (MISCELLANEOUS) ×1
CONTAINER SPEC 2.5X3XGRAD LEK (MISCELLANEOUS) ×1 IMPLANT
DURAPREP 26ML APPLICATOR (WOUND CARE) ×1 IMPLANT
ELECT REM PT RETURN 9FT ADLT (ELECTROSURGICAL) ×1
ELECTRODE REM PT RTRN 9FT ADLT (ELECTROSURGICAL) ×1 IMPLANT
GAUZE SPONGE 4X4 12PLY STRL (GAUZE/BANDAGES/DRESSINGS) ×2 IMPLANT
GAUZE XEROFORM 1X8 LF (GAUZE/BANDAGES/DRESSINGS) ×1 IMPLANT
GLOVE BIO SURGEON STRL SZ7 (GLOVE) ×1 IMPLANT
GLOVE SURG UNDER LTX SZ7 (GLOVE) ×1 IMPLANT
GOWN STRL REUS W/ TWL LRG LVL3 (GOWN DISPOSABLE) ×2 IMPLANT
GOWN STRL REUS W/TWL LRG LVL3 (GOWN DISPOSABLE) ×2
KIT TURNOVER KIT A (KITS) ×1 IMPLANT
MANIFOLD NEPTUNE II (INSTRUMENTS) ×1 IMPLANT
NDL FILTER BLUNT 18X1 1/2 (NEEDLE) ×1 IMPLANT
NDL HYPO 25X1 1.5 SAFETY (NEEDLE) ×2 IMPLANT
NEEDLE FILTER BLUNT 18X1 1/2 (NEEDLE) ×1 IMPLANT
NEEDLE HYPO 25X1 1.5 SAFETY (NEEDLE) ×2 IMPLANT
NS IRRIG 500ML POUR BTL (IV SOLUTION) ×1 IMPLANT
PACK EXTREMITY ARMC (MISCELLANEOUS) ×1 IMPLANT
SUT ETHILON 3-0 FS-10 30 BLK (SUTURE) ×1
SUTURE EHLN 3-0 FS-10 30 BLK (SUTURE) ×2 IMPLANT
SWAB CULTURE AMIES ANAERIB BLU (MISCELLANEOUS) IMPLANT
SYR 10ML LL (SYRINGE) ×1 IMPLANT
TRAP FLUID SMOKE EVACUATOR (MISCELLANEOUS) ×1 IMPLANT

## 2022-02-28 NOTE — Plan of Care (Signed)

## 2022-02-28 NOTE — Anesthesia Postprocedure Evaluation (Signed)
Anesthesia Post Note  Patient: [name].  Procedure(s) Performed: AMPUTATION TOE (Right: Toe)  Patient location during evaluation: PACU Anesthesia Type: General Level of consciousness: awake and alert, oriented and patient cooperative Pain management: pain level controlled Vital Signs Assessment: post-procedure vital signs reviewed and stable Respiratory status: spontaneous breathing, nonlabored ventilation and respiratory function stable Cardiovascular status: blood pressure returned to baseline and stable Postop Assessment: adequate PO intake Anesthetic complications: no   No notable events documented.   Last Vitals:  Vitals:   02/28/22 1115 02/28/22 1125  BP: 137/72 139/77  Pulse: (!) 59 60  Resp: 14 13  Temp:  36.6 C  SpO2: 95% 97%    Last Pain:  Vitals:   02/28/22 1125  TempSrc:   PainSc: 0-No pain                 Darrin Nipper

## 2022-02-28 NOTE — Op Note (Signed)
   OPERATIVE REPORT Patient name: [name]. MRN: [id number] DOB: May 31, 1970  DOS:  02/28/22  Preop Dx: Osteomyelitis of fifth toe of right foot ([place]) Postop Dx: same  Procedure:  1. Right fifth digit amputation   Surgeon: Lorenda Peck, DPM  Anesthesia: 50-50 mixture of 2% lidocaine plain with 0.5% Marcaine plain totaling 10 infiltrated in the patient's right  lower extremity  Hemostasis: No TQ necessary   EBL: minimal  Materials: n/a Injectables: 10 cc lidocaine  Pathology: Right fifth digit for culture and pathology.   Condition: The patient tolerated the procedure and anesthesia well. No complications noted or reported   Justification for procedure: The patient is a 52 y.o. male who presents today for surgical treatment of right fifth digit osteomyelitis  All conservative modalities of been unsuccessful in providing any sort of satisfactory alleviation of symptoms with the patient. The patient was told benefits as well as possible side effects of the surgery. The patient consented for surgical correction. The patient consent form was reviewed. All patient questions were answered. No guarantees were expressed or implied. The patient and the surgeon both signed the patient consent form with the witness present and placed in the patient's chart.   Procedure in Detail: The patient was brought to the operating room, placed in the operating table in the supine position at which time an aseptic scrub and drape were performed about the patient's respective lower extremity after anesthesia was induced as described above. Attention was then directed to the surgical area where procedure number one commenced.  Procedure #1:  Attention was directed to the right fifth digit digit were an incision was preformed encircling the base of the toe. Incision was made down to bone and digit was amputated at the level of the metatarsophalangeal joint. After the toe was disarticulated it was  passed off to the back table to be sent to pathology for further evaluation. The extensor and flexor tendons were identified and resected as far proximally as possible. Any necrotic tissue was removed to healthy bleeding tissue. The metatarsal head was identified and noted to be hard. The area was irrigated copiously sterile saline and residual bone was sent to microbiology. Any bleeders noted were cauterized as necessary. The skin was re-approximated utilizing 3-0 nylon suture.   Dry sterile compressive dressings were then applied to all previously mentioned incision sites about the patient's lower extremity. The patient was then transferred from the operating room to the recovery room having tolerated the procedure and anesthesia well. All vital signs are stable. After a brief stay in the recovery room the patient was readmitted to the floor.    Disposition:  Patient is ok for discharge from podiatry standpoint. Will keep dressing clean dry and intact until follow-up. No antibiotics necessary at this time.  Will need post-op shoe and will follow up in one week with podiatry.    Lorenda Peck, DPM [facility]  Dr. Lorenda Peck, DPM    [address] [place], [place] [address]                Office [phone]  Fax [phone]

## 2022-02-28 NOTE — Progress Notes (Signed)
[facility]  PROGRESS NOTE   Subjective:   Patient had toe surgery this morning.  Anxious to go home.  Objective:  Vital signs: Blood pressure (!) 153/80, pulse 60, temperature 97.8 F (36.6 C), resp. rate 16, height 6\' 3"  (1.905 m), weight 85 kg, SpO2 100 %.  Intake/Output Summary (Last 24 hours) at 02/28/2022 1422 Last data filed at 02/28/2022 1052 Gross per 24 hour  Intake 411.72 ml  Output 2010 ml  Net -1598.28 ml   Filed Weights   02/27/22 1222 02/27/22 1615 02/27/22 2015  Weight: 87.8 kg 85 kg 85 kg     Physical Exam: General:  No acute distress  Head:  Normocephalic, atraumatic. Moist oral mucosal membranes  Eyes:  Anicteric  Neck:  Supple  Lungs:   Clear to auscultation, normal effort  Heart:  S1S2 no rubs  Abdomen:   Soft, nontender, bowel sounds present  Extremities: S/p right fifth toe amputation.  Neurologic:  Awake, alert, following commands  Skin:  No lesions  Access:     Basic Metabolic Panel: Recent Labs  Lab 02/26/22 1445 02/27/22 1236  NA 137 137  K 4.1 4.1  CL 105 107  CO2 22 22  GLUCOSE 76 101*  BUN 40* 44*  CREATININE 6.62* 7.62*  CALCIUM 8.4* 7.9*  MG  --  1.9  PHOS  --  5.2*   GFR: Estimated Creatinine Clearance: 13.2 mL/min (A) (by C-G formula based on SCr of 7.62 mg/dL (H)).  Liver Function Tests: Recent Labs  Lab 02/26/22 1445 02/27/22 1236  AST 13*  --   ALT 12  --   ALKPHOS 78  --   BILITOT 0.8  --   PROT 6.7  --   ALBUMIN 3.3* 2.7*   No results for input(s): "LIPASE", "AMYLASE" in the last 168 hours. No results for input(s): "AMMONIA" in the last 168 hours.  CBC: Recent Labs  Lab 02/26/22 1445 02/27/22 0055 02/27/22 1236 02/28/22 0850  WBC 6.5  --  5.4 5.6  NEUTROABS 3.5  --   --   --   HGB 7.8* 8.1* 7.7* 8.4*  HCT 27.4*  --  26.6* 28.8*  MCV 72.1*  --  71.5* 70.6*  PLT 154  --  137* 130*     HbA1C: Hemoglobin A1C  Date/Time Value Ref Range Status  08/30/2019 12:00 AM 5.2  Final   12/26/2018 12:00 AM 5.9  Final   Hgb A1c MFr Bld  Date/Time Value Ref Range Status  11/21/2021 11:05 AM 4.5 (L) 4.8 - 5.6 % Final    Comment:    (NOTE) Pre diabetes:          5.7%-6.4%  Diabetes:              >6.4%  Glycemic control for   <7.0% adults with diabetes   04/21/2021 10:04 AM 4.7 (L) 4.8 - 5.6 % Final    Comment:    (NOTE) Pre diabetes:          5.7%-6.4%  Diabetes:              >6.4%  Glycemic control for   <7.0% adults with diabetes     Urinalysis: No results for input(s): "COLORURINE", "LABSPEC", "PHURINE", "GLUCOSEU", "HGBUR", "BILIRUBINUR", "KETONESUR", "PROTEINUR", "UROBILINOGEN", "NITRITE", "LEUKOCYTESUR" in the last 72 hours.  Invalid input(s): "APPERANCEUR"    Imaging: ECHOCARDIOGRAM LIMITED  Result Date: 02/28/2022    ECHOCARDIOGRAM LIMITED REPORT   Patient Name:   [name]. Date of Exam: 02/28/2022  Medical Rec #:  [id number]            Height:       75.0 in Accession #:    [id number]           Weight:       187.4 lb Date of Birth:  09-23-1970             BSA:          2.134 m Patient Age:    52 years             BP:           162/84 mmHg Patient Gender: M                    HR:           60 bpm. Exam Location:  [name] Procedure: Limited Echo and Cardiac Doppler Indications:     Pericardial effusion I31.3  History:         Patient has prior history of Echocardiogram examinations, most                  recent 11/22/2021.  Sonographer:     Kathlen Brunswick RDCS Referring Phys:  [id number] CHRISTOPHER RONALD BERGE Diagnosing Phys: Fransico Him MD IMPRESSIONS  1. Left ventricular ejection fraction, by estimation, is 60 to 65%. The left ventricle has normal function. The left ventricle has no regional wall motion abnormalities. There is severe concentric left ventricular hypertrophy.  2. Right ventricular systolic function is normal. The right ventricular size is normal. Tricuspid regurgitation signal is inadequate for assessing PA pressure.  3. The mitral valve is  normal in structure. No evidence of mitral valve regurgitation. No evidence of mitral stenosis. Severe mitral annular calcification.  4. The aortic valve is normal in structure. Aortic valve regurgitation is not visualized. Aortic valve sclerosis/calcification is present, without any evidence of aortic stenosis.  5. The inferior vena cava is normal in size with greater than 50% respiratory variability, suggesting right atrial pressure of 3 mmHg.  6. There is a moderate circumferential pericardial with greatest diameter measuring 2.84cm. There is RA inversion but no RV diastolic collapse. There is no significant respirophasic changes in MV inflow velocities. THe IVC is not dilated. There is no evidence of cardiac tamponade.  7. Compared to study dated 02/26/20, the pericardial effusion is slightly larger. Also, with severe LVH would consider PYP scan to rule out amyloidosis. FINDINGS  Left Ventricle: Left ventricular ejection fraction, by estimation, is 60 to 65%. The left ventricle has normal function. The left ventricle has no regional wall motion abnormalities. The left ventricular internal cavity size was normal in size. There is  severe concentric left ventricular hypertrophy. Right Ventricle: The right ventricular size is normal. No increase in right ventricular wall thickness. Right ventricular systolic function is normal. Tricuspid regurgitation signal is inadequate for assessing PA pressure. Left Atrium: Left atrial size was normal in size. Right Atrium: Right atrial size was normal in size. Pericardium: There is a circumferential pericardial effusion with greatest diameter 2.84cm. A moderately sized pericardial effusion is present. The pericardial effusion is posterior to the left ventricle. There is diastolic collapse of the right atrial wall. There is no evidence of cardiac tamponade. Mitral Valve: The mitral valve is normal in structure. Severe mitral annular calcification. No evidence of mitral valve  stenosis. Tricuspid Valve: The tricuspid valve is normal in structure. Tricuspid valve regurgitation is not demonstrated. No evidence of tricuspid  stenosis. Aortic Valve: The aortic valve is normal in structure. Aortic valve regurgitation is not visualized. Aortic valve sclerosis/calcification is present, without any evidence of aortic stenosis. Pulmonic Valve: The pulmonic valve was normal in structure. Pulmonic valve regurgitation is not visualized. No evidence of pulmonic stenosis. Aorta: The aortic root is normal in size and structure. Venous: The inferior vena cava is normal in size with greater than 50% respiratory variability, suggesting right atrial pressure of 3 mmHg. IAS/Shunts: No atrial level shunt detected by color flow Doppler. LEFT VENTRICLE PLAX 2D LVIDd:         4.60 cm LVIDs:         3.20 cm LV PW:         2.20 cm LV IVS:        2.10 cm  LEFT ATRIUM         Index LA diam:    4.70 cm 2.20 cm/m Fransico Him MD Electronically signed by Fransico Him MD Signature Date/Time: 02/28/2022/9:55:42 AM    Final    CT Angio Aortobifemoral W and/or Wo Contrast  Result Date: 02/26/2022 CLINICAL DATA:  Peripheral arterial disease, osteomyelitis EXAM: CT ANGIOGRAPHY OF ABDOMINAL AORTA WITH ILIOFEMORAL RUNOFF TECHNIQUE: Multidetector CT imaging of the abdomen, pelvis and lower extremities was performed using the standard protocol during bolus administration of intravenous contrast. Multiplanar CT image reconstructions and MIPs were obtained to evaluate the vascular anatomy. RADIATION DOSE REDUCTION: This exam was performed according to the departmental dose-optimization program which includes automated exposure control, adjustment of the mA and/or kV according to patient size and/or use of iterative reconstruction technique. CONTRAST:  176mL OMNIPAQUE IOHEXOL 350 MG/ML SOLN COMPARISON:  07/18/2017 FINDINGS: VASCULAR Coronary calcifications. Aorta: Minimal calcified atheromatous plaque. No aneurysm, dissection, or  stenosis. Celiac: Widely patent. Left gastric artery arises directly from the aorta, an anatomic variant SMA: Patent without evidence of aneurysm, dissection, vasculitis or significant stenosis. Renals: Both renal arteries are patent without evidence of aneurysm, dissection, vasculitis, fibromuscular dysplasia or significant stenosis. IMA: Patent without evidence of aneurysm, dissection, vasculitis or significant stenosis. RIGHT Lower Extremity Inflow: Common iliac: Scattered calcified plaque; no aneurysm, dissection, or significant stenosis. Internal iliac: Mildly atheromatous, patent. External iliac: Patent. Outflow: Common femoral: Patent. Deep femoral branches patent. SFA scattered calcified plaque without stenosis. Popliteal moderate calcified plaque below the knee, without significant stenosis. Runoff: Evaluation limited due to poor scan timing. There is heavy medial calcification. LEFT Lower Extremity Inflow: Common iliac minimally atheromatous, patent. Internal iliac mildly atheromatous, patent External iliac patent. Outflow: Common femoral patent. Deep femoral branches patent. SFA scattered calcified plaque, no stenosis or occlusion. Popliteal moderate calcified plaque without significant stenosis. Runoff: Limited due to poor arterial opacification, and heavy medial calcification in trifurcation runoff vessels. Veins: Limited venous opacification on this arterial phase study. Curvilinear calcifications in bilateral popliteal and femoral veins suggesting stigmata of previous thrombosis. Patent splenic and portal veins. Review of the MIP images confirms the above findings. NON-VASCULAR Lower chest: Moderate pericardial effusion. No pleural effusion. Minimal atelectasis in the visualized lung bases. Hepatobiliary: No focal liver abnormality is seen. No gallstones, gallbladder wall thickening, or biliary dilatation. Pancreas: Unremarkable. No pancreatic ductal dilatation or surrounding inflammatory changes.  Spleen: Normal in size without focal abnormality. Adrenals/Urinary Tract: No adrenal mass. Symmetric renal enhancement without focal lesion or hydronephrosis. Nondistended urinary bladder with wall thickening. Stomach/Bowel: Stomach is incompletely distended, unremarkable. Small bowel decompressed. Normal appendix. The colon is incompletely distended. Circumferential wall thickening in the rectum with mild regional inflammatory/edematous  change. Lymphatic: No abdominal or pelvic adenopathy. Reproductive: Prostate unremarkable. Other: No ascites.  No free air. Musculoskeletal: Cortical destruction involving proximal and middle phalanges of the right fifth toe of uncertain chronicity. 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 Subchondral destructive changes at the left second MTP joint. IMPRESSION: 1. No significant aortoiliac or femoropopliteal occlusive disease. 2. Limited evaluation of bilateral tibial runoff due to poor arterial opacification and heavy medial calcification. 3. Moderate pericardial effusion. 4. Wall thickening in the rectum suggesting proctitis. 5. Destructive changes in the right fifth toe and left second MTP joint of uncertain chronicity. 6. Coronary and aortic Atherosclerosis (ICD10-I70.0). Electronically Signed   By: Lucrezia Europe M.D.   On: 02/26/2022 19:02   DG Foot Complete Right  Result Date: 02/26/2022 CLINICAL DATA:  Chronic wound infection.  Concern for osteomyelitis. EXAM: RIGHT FOOT COMPLETE - 3+ VIEW COMPARISON:  02/26/2022, earlier today. FINDINGS: There is diffuse soft tissue edema. Extensive vascular calcifications identified. Signs of bony destruction involving the head of the fifth proximal phalanx, fifth middle phalanx and fifth distal phalanx. Imaging findings are compatible with underlying acute osteomyelitis. No additional areas of bone erosion identified. No acute fracture. IMPRESSION: 1. Signs of acute osteomyelitis involving the head of the fifth proximal phalanx,  fifth middle phalanx and fifth distal phalanx. 2. Diffuse soft tissue edema. 3. Extensive vascular calcifications. Electronically Signed   By: Kerby Moors M.D.   On: 02/26/2022 18:58     Medications:    anticoagulant sodium citrate     ceFEPime (MAXIPIME) IV 1 g (02/27/22 1953)   vancomycin 1,000 mg (02/27/22 1509)    (feeding supplement) PROSource Plus  30 mL Oral BID BM   amLODipine  10 mg Oral Daily   atorvastatin  80 mg Oral Daily   calcium acetate  1,334 mg Oral TID WC   carvedilol  25 mg Oral BID   Chlorhexidine Gluconate Cloth  6 each Topical Q0600   cloNIDine  0.3 mg Oral Q8H   dextrose       feeding supplement (NEPRO CARB STEADY)  237 mL Oral Q24H   insulin aspart  0-6 Units Subcutaneous Q4H   irbesartan  300 mg Oral Daily   levETIRAcetam  750 mg Oral 2 times per day on Tue Thu Sat   levETIRAcetam  750 mg Oral Once per day on Sun Mon Wed Fri   multivitamin  1 tablet Oral QHS    Assessment/ Plan:     52 year old male with a history of hypertension, coronary artery disease, congestive heart failure, history of CVA, diabetes, peripheral vascular disease, history of osteomyelitis, end-stage renal disease with anemia on dialysis on a Tuesday Thursday Saturday schedule now admitted with history of nonhealing toe ulcers.   ESRD: Dialysis via AV fistula.  Had stable dialysis yesterday.  Patient to resume TTS schedule.   ANEMIA: We will continue to monitor closely and follow anemia protocols.   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  Will continue calcium acetate.   HTN/VOL: Blood pressure is well-controlled.   ACCESS: Access works very well.   Acute osteomyelitis: On cefepime and vancomycin.  S/p toe amputation.     Will follow closely.    LOS: [place], MD [facility] 1/7/20242:22 PM

## 2022-02-28 NOTE — Anesthesia Preprocedure Evaluation (Addendum)
Anesthesia Evaluation  Patient identified by MRN, date of birth, ID band Patient awake    Reviewed: Allergy & Precautions, NPO status , Patient's Chart, lab work & pertinent test results  History of Anesthesia Complications Negative for: history of anesthetic complications  Airway Mallampati: IV   Neck ROM: Full    Dental  (+) Poor Dentition   Pulmonary neg pulmonary ROS   Pulmonary exam normal breath sounds clear to auscultation       Cardiovascular hypertension, + CAD (s/p stents), + Past MI, + Peripheral Vascular Disease and +CHF  Normal cardiovascular exam+ dysrhythmias (a fib)  Rhythm:Regular Rate:Normal  Echo 02/28/22:   1. Left ventricular ejection fraction, by estimation, is 60 to 65%. The left ventricle has normal function. The left ventricle has no regional wall motion abnormalities. There is severe concentric left ventricular hypertrophy.   2. Right ventricular systolic function is normal. The right ventricular size is normal. Tricuspid regurgitation signal is inadequate for assessing PA pressure.   3. The mitral valve is normal in structure. No evidence of mitral valve regurgitation. No evidence of mitral stenosis. Severe mitral annular calcification.   4. The aortic valve is normal in structure. Aortic valve regurgitation is not visualized. Aortic valve sclerosis/calcification is present, without any evidence of aortic stenosis.   5. The inferior vena cava is normal in size with greater than 50% respiratory variability, suggesting right atrial pressure of 3 mmHg.   6. There is a moderate circumferential pericardial with greatest diameter measuring 2.84cm. There is RA inversion but no RV diastolic collapse. There is no significant respirophasic changes in MV inflow velocities. The IVC is not dilated. There is no evidence of cardiac tamponade.   7. Compared to study dated 02/26/20, the pericardial effusion is slightly  larger.  Also, with severe LVH would consider PYP scan to rule out amyloidosis.   ECG 11/25/21:  Normal sinus rhythm Moderate voltage criteria for LVH, may be normal variant ( Sokolow-Lyon , [employer] product ) Prolonged QT Abnormal ECG When compared with ECG of 21-Nov-2021 10:29, No significant change since last tracing   Neuro/Psych Seizures - (in setting of hyperkalemia 2/2 missed dialysis),  PSYCHIATRIC DISORDERS Anxiety     Left eye enucleation; cerebral hemorrhage 10/2021 2/2 hypertensive emergency  Neuromuscular disease (diabetic neuropathy) CVA (multiple), No Residual Symptoms    GI/Hepatic negative GI ROS,,,  Endo/Other  diabetes, Type 2    Renal/GU ESRF and DialysisRenal disease (nephrolithiasis; last HD 02/27/22)   BPH    Musculoskeletal Osteomyelitis    Abdominal   Peds  Hematology  (+) Blood dyscrasia, anemia   Anesthesia Other Findings   Reproductive/Obstetrics                             Anesthesia Physical Anesthesia Plan  ASA: 4 and emergent  Anesthesia Plan: General   Post-op Pain Management:    Induction: Intravenous  PONV Risk Score and Plan: 2 and Ondansetron, Dexamethasone, Treatment may vary due to age or medical condition, Propofol infusion and TIVA  Airway Management Planned: Natural Airway  Additional Equipment:   Intra-op Plan:   Post-operative Plan:   Informed Consent: I have reviewed the patients History and Physical, chart, labs and discussed the procedure including the risks, benefits and alternatives for the proposed anesthesia with the patient or authorized representative who has indicated his/her understanding and acceptance.       Plan Discussed with: CRNA  Anesthesia Plan Comments: (LMA/GETA backup  discussed.  Patient consented for risks of anesthesia including but not limited to:  - adverse reactions to medications - damage to eyes, teeth, lips or other oral mucosa - nerve damage due to positioning  -  sore throat or hoarseness - damage to heart, brain, nerves, lungs, other parts of body or loss of life  Informed patient about role of CRNA in peri- and intra-operative care.  Patient voiced understanding.)        Anesthesia Quick Evaluation

## 2022-02-28 NOTE — Progress Notes (Signed)
Patient refused AM Finger stick. Nurse Shaun notified.

## 2022-02-28 NOTE — Progress Notes (Signed)
  Echocardiogram 2D Echocardiogram has been performed. Limited Echo was requested and completed.  [name] 02/28/2022, 8:39 AM

## 2022-02-28 NOTE — Progress Notes (Signed)
Pt refused finger stick after surgery in PACU.

## 2022-02-28 NOTE — Transfer of Care (Signed)
Immediate Anesthesia Transfer of Care Note  Patient: [name].  Procedure(s) Performed: AMPUTATION TOE (Right: Toe)  Patient Location: PACU  Anesthesia Type:General  Level of Consciousness: awake, alert , and oriented  Airway & Oxygen Therapy: Patient Spontanous Breathing  Post-op Assessment: Report given to RN and Post -op Vital signs reviewed and stable  Post vital signs: Reviewed and stable  Last Vitals:  Vitals Value Taken Time  BP 130/73 02/28/22 1101  Temp    Pulse 58 02/28/22 1101  Resp 5 02/28/22 1101  SpO2 98 % 02/28/22 1101  Vitals shown include unvalidated device data.  Last Pain:  Vitals:   02/27/22 2028  TempSrc:   PainSc: 0-No pain      Patients Stated Pain Goal: 0 (88/64/84 7207)  Complications: No notable events documented.

## 2022-02-28 NOTE — Anesthesia Procedure Notes (Signed)
Date/Time: 02/28/2022 10:35 AM  Performed by: Johnna Acosta, CRNAPre-anesthesia Checklist: Patient identified, Emergency Drugs available, Suction available, Patient being monitored and Timeout performed Patient Re-evaluated:Patient Re-evaluated prior to induction Oxygen Delivery Method: Simple face mask Preoxygenation: Pre-oxygenation with 100% oxygen Induction Type: IV induction

## 2022-02-28 NOTE — Brief Op Note (Signed)
02/26/2022 - 02/28/2022  10:33 AM  PATIENT:  [name].  52 y.o. male  PRE-OPERATIVE DIAGNOSIS:  right fifth digit osteomyelitis  POST-OPERATIVE DIAGNOSIS:  * No post-op diagnosis entered *  PROCEDURE:  Procedure(s) with comments: AMPUTATION TOE (Right) - right fifth toe  SURGEON:  Surgeon(s) and Role:    * Lorenda Peck, DPM - Primary  PHYSICIAN ASSISTANT:   ASSISTANTS: none   ANESTHESIA:   local and MAC  EBL:  Minimal    BLOOD ADMINISTERED:none  DRAINS: none   LOCAL MEDICATIONS USED:  LIDOCAINE  10cc  SPECIMEN:  Source of Specimen:  Right fifth toe for pathology and swab for culture   DISPOSITION OF SPECIMEN:   Pathology and culture   COUNTS:  YES  TOURNIQUET:  No TQ   DICTATION: .Note written in EPIC  PLAN OF CARE: Admit to inpatient   PATIENT DISPOSITION:  PACU - hemodynamically stable.   Delay start of Pharmacological VTE agent (>24hrs) due to surgical blood loss or risk of bleeding: no

## 2022-02-28 NOTE — Progress Notes (Signed)
Patient refused lunch finger stick. Nurse Shaun notified.

## 2022-02-28 NOTE — Progress Notes (Signed)
Initial Nutrition Assessment RD working remotely.   DOCUMENTATION CODES:   Not applicable  INTERVENTION:  - ordered Nepro Shake once/day, each supplement provides 425 kcal and 19 grams protein.  - ordered 30 ml Prosource Plus BID, each supplement provides 100 kcal and 15 grams protein.   - ordered 1 tablet rena-vite/day.  - complete NFPE when feasible.   NUTRITION DIAGNOSIS:   Increased nutrient needs related to acute illness, chronic illness, post-op healing as evidenced by estimated needs.  GOAL:   Patient will meet greater than or equal to 90% of their needs  MONITOR:   PO intake, Supplement acceptance, Labs, Weight trends  REASON FOR ASSESSMENT:   Consult Wound healing  ASSESSMENT:   52 y.o. male with medical history of ESRD on HD TTS, PAD and osteomyelitis requiring finger amputation on 01/2021, multiple prior CVAs, intraventricular hemorrhage 10/2021 in the setting of DAPT and DOAC and hypertensive emergency, CAD with hx of STEMI, HFpEF, permanent A-fib not on anticoagulation due to history of brain bleed,  chronic anemia, L eye enucleation, seizure, HTN, HLD, DM, chronic diarrhea, GERD, and gastroparesis. He was sent to the ED by Podiatry due to osteomyelitis of R fifth toe requiring amputation.  Diet advanced from NPO to Renal with 1.2 L fluid restriction yesterday at 1019; no meal intake percentages documented in the flow sheet. He was made NPO again at midnight.  Patient is currently out of the room to OR for R fifth toe amputation.   He was most recently assessed by a [place] RD on 12/04/21. At that time, patient met criteria for severe malnutrition in the context of chronic disease as evidenced by severe fat depletions and severe muscle depletions. Suspect this is ongoing but unable to confirm without completing NFPE.  Weight yesterday was 187 lb and weight is +11 lb compared to weight on 12/05/21. Non-pitting edema to all extremities documented in the  edema section of flow sheet.    Labs reviewed; CBGs: 61 and 120 mg/dl, BUN: 44 mg/dl, creatinine: 7.62 mg/dl, Ca: 7.9 mg/dl, Phos: 5.2 mg/dl, Mg WDL, GFR: 8.  Medications reviewed; 1334 mg phoslo TID, sliding scale novolog.  IVF; LR @ 125 ml/hr.     NUTRITION - FOCUSED PHYSICAL EXAM:  RD working remotely.  Diet Order:   Diet Order             Diet NPO time specified  Diet effective midnight                   EDUCATION NEEDS:   No education needs have been identified at this time  Skin:  Skin Assessment: Reviewed RN Assessment  Last BM:  1/7 (type 6 x1, large amount)  Height:   Ht Readings from Last 1 Encounters:  02/27/22 6\' 3"  (1.905 m)    Weight:   Wt Readings from Last 1 Encounters:  02/27/22 85 kg    BMI:  Body mass index is 23.42 kg/m.  Estimated Nutritional Needs:  Kcal:  2300-2600 kcal Protein:  115-130 grams Fluid:  UOP + 1 L/day     Jarome Matin, MS, RD, LDN, CNSC Clinical Dietitian PRN/Relief staff On-call/weekend pager # available in [facility]

## 2022-02-28 NOTE — Interval H&P Note (Signed)
History and Physical Interval Note:  02/28/2022 10:18 AM  [name].  has presented today for surgery, with the diagnosis of right fifth digit osteomyelitis.  The various methods of treatment have been discussed with the patient and family. After consideration of risks, benefits and other options for treatment, the patient has consented to  Procedure(s) with comments: AMPUTATION TOE (Right) - right fifth toe as a surgical intervention.  The patient's history has been reviewed, patient examined, no change in status, stable for surgery.  I have reviewed the patient's chart and labs.  Questions were answered to the patient's satisfaction.     Lorenda Peck

## 2022-02-28 NOTE — Anesthesia Procedure Notes (Signed)
Date/Time: 02/28/2022 10:42 AM  Performed by: Johnna Acosta, CRNAPre-anesthesia Checklist: Patient identified, Emergency Drugs available, Suction available, Patient being monitored and Timeout performed Patient Re-evaluated:Patient Re-evaluated prior to induction Oxygen Delivery Method: Simple face mask Preoxygenation: Pre-oxygenation with 100% oxygen Induction Type: IV induction

## 2022-02-28 NOTE — Discharge Summary (Signed)
Physician Discharge Summary   Patient: [name]. MRN: [id number] DOB: [birth year]  Admit date:     02/26/2022  Discharge date: {dischdate:26783}  Discharge Physician: Jennye Boroughs   PCP: [name], MD   Recommendations at discharge:  {Tip this will not be part of the note when signed- Example include specific recommendations for outpatient follow-up, pending tests to follow-up on. (Optional):26781}  ***  Discharge Diagnoses: Principal Problem:   Acute osteomyelitis of toe of right foot with gangrene [facility]) Active Problems:   Preoperative clearance   Acute on chronic anemia   Paroxysmal atrial fibrillation (HCC)   Chronic Grade 2 diastolic heart failure/EF 55 to 60%   CAD/ H/o prior STEMI 02/2020 s/p DES to LAD   ESRD on hemodialysis [facility])   H/O spontaneous intraventricular intracranial hemorrhage 10/2021   History of CVA (cerebrovascular accident)   Essential hypertension, benign   Diabetes mellitus, type II ([place])   H/O enucleation of left eyeball   PVD (peripheral vascular disease) ([place])   History of seizure  Resolved Problems:   * No resolved hospital problems. [place] [facility] Course:  Plan discussed with patient and his wife at the bedside.  Assessment and Plan: * Acute osteomyelitis of toe of right foot with gangrene (HCC) PVD Continue cefepime and vancomycin started in the ED Foot x-ray showing osteomyelitis, acute of right fifth toe.  CTA showing no occlusion Will get vascular consult Podiatry consult N.p.o. from midnight anticipated amputation  Preoperative clearance Will get cardiology consult for clearance given extensive history  Acute on chronic anemia Hemoglobin 7.8, down from baseline of 9.3 Acute bleeding not suspected at this time patient denies melena, hematemesis Continue to monitor Will type and screen and transfuse if under 7 Anemia panel  Paroxysmal atrial fibrillation (HCC) Continue amiodarone and carvedilol No longer on systemic  anticoagulation, likely secondary to intraventricular bleed in September 2023  CAD/ H/o prior STEMI 02/2020 s/p DES to LAD Patient denies chest pain Will get baseline EKG and troponin as part of clearance for upcoming surgery Continue nitroglycerin, carvedilol, atorvastatin, irbesartan  Chronic Grade 2 diastolic heart failure/EF 55 to 60% Clinically euvolemic Continue carvedilol, irbesartan  ESRD on hemodialysis [facility]) Nephrology consult for continuation of dialysis  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  No antiplatelets seen on med list  Essential hypertension, benign BP controlled.  Continue carvedilol and irbesartan.  Holding clonidine and amlodipine  Diabetes mellitus, type II (HCC) Sliding scale insulin coverage  History of seizure Continue Keppra  H/O enucleation of left eyeball No acute issues      {Tip this will not be part of the note when signed Body mass index is 23.42 kg/m. ,  Nutrition Documentation    [place] ED to Hosp-Admission (Current) from 02/26/2022 in [place] (1A)  Nutrition Problem Increased nutrient needs  Etiology acute illness, chronic illness, post-op healing  Nutrition Goal Patient will meet greater than or equal to 90% of their needs  Interventions Nepro shake, Prostat, Juven, MVI     ,  Active Pressure Injury/Wound(s)     Pressure Ulcer  Duration          Pressure Injury 11/28/21 Buttocks Left Stage 2 -  Partial thickness loss of dermis presenting as a shallow open injury with a red, pink wound bed without slough. blister 91 days   Pressure Injury 11/28/21 Scrotum Medial Stage  2 -  Partial thickness loss of dermis presenting as a shallow open injury with a red, pink wound bed without slough. round open red area 91 days            (Optional):26781}  {(NOTE) Pain control PDMP Statment (Optional):26782} Consultants: *** Procedures performed: ***  Disposition: {Plan; Disposition:26390} Diet recommendation:  Discharge Diet Orders (From admission, onward)     Start     Ordered   02/28/22 0000  Diet - low sodium heart healthy        02/28/22 1356           Renal diet DISCHARGE MEDICATION: Allergies as of 02/28/2022       Reactions   Promethazine Diarrhea, Other (See Comments)   Muscle cramps   Promethazine Hcl Other (See Comments)   Cramping all over Other reaction(s): cramp all over   Other    Other reaction(s): Unknown Other reaction(s): Unknown   Malt Other (See Comments)   Cramping        Medication List     STOP taking these medications    Anusol-HC 2.5 % rectal cream Generic drug: hydrocortisone   cholestyramine 4 GM/DOSE powder Commonly known as: QUESTRAN   hydrocortisone cream 1 %   nitroGLYCERIN 0.4 MG SL tablet Commonly known as: NITROSTAT   ondansetron 4 MG disintegrating tablet Commonly known as: ZOFRAN-ODT   oxyCODONE-acetaminophen 5-325 MG tablet Commonly known as: PERCOCET/ROXICET   tamsulosin 0.4 MG Caps capsule Commonly known as: FLOMAX       TAKE these medications    acarbose 50 MG tablet Commonly known as: PRECOSE Take 50 mg by mouth 3 (three) times daily.   acetaminophen 325 MG tablet Commonly known as: TYLENOL Take 1-2 tablets (325-650 mg total) by mouth every 4 (four) hours as needed for mild pain.   amiodarone 200 MG tablet Commonly known as: PACERONE Take 1 tablet (200 mg total) by mouth every morning.   amLODipine 10 MG tablet Commonly known as: NORVASC Take 1 tablet (10 mg total) by mouth daily.   atorvastatin 80 MG tablet Commonly known as: LIPITOR Take 1 tablet (80 mg total) by mouth daily.   calcium acetate 667 MG capsule Commonly known as: PHOSLO Take 2 capsules (1,334 mg total) by mouth 3 (three) times daily with meals.    carvedilol 25 MG tablet Commonly known as: COREG Take 25 mg by mouth 2 (two) times daily.   cloNIDine 0.3 MG tablet Commonly known as: CATAPRES Take 1 tablet (0.3 mg total) by mouth every 8 (eight) hours. What changed: additional instructions   dorzolamide-timolol 2-0.5 % ophthalmic solution Commonly known as: COSOPT Place 1 drop into the right eye 2 (two) times daily.   irbesartan 300 MG tablet Commonly known as: AVAPRO Take 1 tablet (300 mg total) by mouth daily.   levETIRAcetam 250 MG tablet Commonly known as: KEPPRA Take 1 tablet (250 mg total) by mouth Every Tuesday,Thursday,and Saturday with dialysis. What changed: Another medication with the same name was changed. Make sure you understand how and when to take each.   levETIRAcetam 750 MG tablet Commonly known as: KEPPRA Take 1 tablet (750 mg total) by mouth 2 (two) times daily. What changed: additional instructions   lidocaine-prilocaine cream Commonly known as: EMLA Apply topically.   omeprazole 40 MG capsule Commonly known as: PRILOSEC Take 40 mg by mouth as needed.        Follow-up Information     Lorenda Peck, DPM. Schedule an appointment as soon as  possible for a visit in 1 week(s).   Specialty: Podiatry Contact information: [place] [address] [phone]                Discharge Exam: Danley Danker Weights   02/27/22 1222 02/27/22 1615 02/27/22 2015  Weight: 87.8 kg 85 kg 85 kg   GEN: NAD SKIN: No rash*** EYES: Abnormal left eye ENT: MMM CV: RRR PULM: CTA B ABD: soft, ND, NT, +BS CNS: AAO x 3, non focal EXT: Dressing on right foot is clean, dry and intact.  Postop shoe on right foot.   Condition at discharge: {DC Condition:26389}  The results of significant diagnostics from this hospitalization (including imaging, microbiology, ancillary and laboratory) are listed below for reference.   Imaging Studies: ECHOCARDIOGRAM LIMITED  Result Date: 02/28/2022     ECHOCARDIOGRAM LIMITED REPORT   Patient Name:   [name]. Date of Exam: 02/28/2022 Medical Rec #:  [id number]            Height:       75.0 in Accession #:    [id number]           Weight:       187.4 lb Date of Birth:  [birth year]             BSA:          2.134 m Patient Age:    85 years             BP:           162/84 mmHg Patient Gender: M                    HR:           60 bpm. Exam Location:  [name] Procedure: Limited Echo and Cardiac Doppler Indications:     Pericardial effusion I31.3  History:         Patient has prior history of Echocardiogram examinations, most                  recent 11/22/2021.  Sonographer:     Kathlen Brunswick RDCS Referring Phys:  [id number] CHRISTOPHER RONALD BERGE Diagnosing Phys: Fransico Him MD IMPRESSIONS  1. Left ventricular ejection fraction, by estimation, is 60 to 65%. The left ventricle has normal function. The left ventricle has no regional wall motion abnormalities. There is severe concentric left ventricular hypertrophy.  2. Right ventricular systolic function is normal. The right ventricular size is normal. Tricuspid regurgitation signal is inadequate for assessing PA pressure.  3. The mitral valve is normal in structure. No evidence of mitral valve regurgitation. No evidence of mitral stenosis. Severe mitral annular calcification.  4. The aortic valve is normal in structure. Aortic valve regurgitation is not visualized. Aortic valve sclerosis/calcification is present, without any evidence of aortic stenosis.  5. The inferior vena cava is normal in size with greater than 50% respiratory variability, suggesting right atrial pressure of 3 mmHg.  6. There is a moderate circumferential pericardial with greatest diameter measuring 2.84cm. There is RA inversion but no RV diastolic collapse. There is no significant respirophasic changes in MV inflow velocities. THe IVC is not dilated. There is no evidence of cardiac tamponade.  7. Compared to study dated 02/26/20, the pericardial  effusion is slightly larger. Also, with severe LVH would consider PYP scan to rule out amyloidosis. FINDINGS  Left Ventricle: Left ventricular ejection fraction, by estimation, is 60 to 65%. The left ventricle has normal function.  The left ventricle has no regional wall motion abnormalities. The left ventricular internal cavity size was normal in size. There is  severe concentric left ventricular hypertrophy. Right Ventricle: The right ventricular size is normal. No increase in right ventricular wall thickness. Right ventricular systolic function is normal. Tricuspid regurgitation signal is inadequate for assessing PA pressure. Left Atrium: Left atrial size was normal in size. Right Atrium: Right atrial size was normal in size. Pericardium: There is a circumferential pericardial effusion with greatest diameter 2.84cm. A moderately sized pericardial effusion is present. The pericardial effusion is posterior to the left ventricle. There is diastolic collapse of the right atrial wall. There is no evidence of cardiac tamponade. Mitral Valve: The mitral valve is normal in structure. Severe mitral annular calcification. No evidence of mitral valve stenosis. Tricuspid Valve: The tricuspid valve is normal in structure. Tricuspid valve regurgitation is not demonstrated. No evidence of tricuspid stenosis. Aortic Valve: The aortic valve is normal in structure. Aortic valve regurgitation is not visualized. Aortic valve sclerosis/calcification is present, without any evidence of aortic stenosis. Pulmonic Valve: The pulmonic valve was normal in structure. Pulmonic valve regurgitation is not visualized. No evidence of pulmonic stenosis. Aorta: The aortic root is normal in size and structure. Venous: The inferior vena cava is normal in size with greater than 50% respiratory variability, suggesting right atrial pressure of 3 mmHg. IAS/Shunts: No atrial level shunt detected by color flow Doppler. LEFT VENTRICLE PLAX 2D LVIDd:          4.60 cm LVIDs:         3.20 cm LV PW:         2.20 cm LV IVS:        2.10 cm  LEFT ATRIUM         Index LA diam:    4.70 cm 2.20 cm/m Fransico Him MD Electronically signed by Fransico Him MD Signature Date/Time: 02/28/2022/9:55:42 AM    Final    CT Angio Aortobifemoral W and/or Wo Contrast  Result Date: 02/26/2022 CLINICAL DATA:  Peripheral arterial disease, osteomyelitis EXAM: CT ANGIOGRAPHY OF ABDOMINAL AORTA WITH ILIOFEMORAL RUNOFF TECHNIQUE: Multidetector CT imaging of the abdomen, pelvis and lower extremities was performed using the standard protocol during bolus administration of intravenous contrast. Multiplanar CT image reconstructions and MIPs were obtained to evaluate the vascular anatomy. RADIATION DOSE REDUCTION: This exam was performed according to the departmental dose-optimization program which includes automated exposure control, adjustment of the mA and/or kV according to patient size and/or use of iterative reconstruction technique. CONTRAST:  134mL OMNIPAQUE IOHEXOL 350 MG/ML SOLN COMPARISON:  07/18/2017 FINDINGS: VASCULAR Coronary calcifications. Aorta: Minimal calcified atheromatous plaque. No aneurysm, dissection, or stenosis. Celiac: Widely patent. Left gastric artery arises directly from the aorta, an anatomic variant SMA: Patent without evidence of aneurysm, dissection, vasculitis or significant stenosis. Renals: Both renal arteries are patent without evidence of aneurysm, dissection, vasculitis, fibromuscular dysplasia or significant stenosis. IMA: Patent without evidence of aneurysm, dissection, vasculitis or significant stenosis. RIGHT Lower Extremity Inflow: Common iliac: Scattered calcified plaque; no aneurysm, dissection, or significant stenosis. Internal iliac: Mildly atheromatous, patent. External iliac: Patent. Outflow: Common femoral: Patent. Deep femoral branches patent. SFA scattered calcified plaque without stenosis. Popliteal moderate calcified plaque below the knee, without  significant stenosis. Runoff: Evaluation limited due to poor scan timing. There is heavy medial calcification. LEFT Lower Extremity Inflow: Common iliac minimally atheromatous, patent. Internal iliac mildly atheromatous, patent External iliac patent. Outflow: Common femoral patent. Deep femoral branches patent. SFA scattered calcified  plaque, no stenosis or occlusion. Popliteal moderate calcified plaque without significant stenosis. Runoff: Limited due to poor arterial opacification, and heavy medial calcification in trifurcation runoff vessels. Veins: Limited venous opacification on this arterial phase study. Curvilinear calcifications in bilateral popliteal and femoral veins suggesting stigmata of previous thrombosis. Patent splenic and portal veins. Review of the MIP images confirms the above findings. NON-VASCULAR Lower chest: Moderate pericardial effusion. No pleural effusion. Minimal atelectasis in the visualized lung bases. Hepatobiliary: No focal liver abnormality is seen. No gallstones, gallbladder wall thickening, or biliary dilatation. Pancreas: Unremarkable. No pancreatic ductal dilatation or surrounding inflammatory changes. Spleen: Normal in size without focal abnormality. Adrenals/Urinary Tract: No adrenal mass. Symmetric renal enhancement without focal lesion or hydronephrosis. Nondistended urinary bladder with wall thickening. Stomach/Bowel: Stomach is incompletely distended, unremarkable. Small bowel decompressed. Normal appendix. The colon is incompletely distended. Circumferential wall thickening in the rectum with mild regional inflammatory/edematous change. Lymphatic: No abdominal or pelvic adenopathy. Reproductive: Prostate unremarkable. Other: No ascites.  No free air. Musculoskeletal: Cortical destruction involving proximal and middle phalanges of the right fifth toe of uncertain chronicity. 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 Subchondral destructive changes at the left second  MTP joint. IMPRESSION: 1. No significant aortoiliac or femoropopliteal occlusive disease. 2. Limited evaluation of bilateral tibial runoff due to poor arterial opacification and heavy medial calcification. 3. Moderate pericardial effusion. 4. Wall thickening in the rectum suggesting proctitis. 5. Destructive changes in the right fifth toe and left second MTP joint of uncertain chronicity. 6. Coronary and aortic Atherosclerosis (ICD10-I70.0). Electronically Signed   By: Lucrezia Europe M.D.   On: 02/26/2022 19:02   DG Foot Complete Right  Result Date: 02/26/2022 CLINICAL DATA:  Chronic wound infection.  Concern for osteomyelitis. EXAM: RIGHT FOOT COMPLETE - 3+ VIEW COMPARISON:  02/26/2022, earlier today. FINDINGS: There is diffuse soft tissue edema. Extensive vascular calcifications identified. Signs of bony destruction involving the head of the fifth proximal phalanx, fifth middle phalanx and fifth distal phalanx. Imaging findings are compatible with underlying acute osteomyelitis. No additional areas of bone erosion identified. No acute fracture. IMPRESSION: 1. Signs of acute osteomyelitis involving the head of the fifth proximal phalanx, fifth middle phalanx and fifth distal phalanx. 2. Diffuse soft tissue edema. 3. Extensive vascular calcifications. Electronically Signed   By: Kerby Moors M.D.   On: 02/26/2022 18:58   VAS US DUPLEX DIALYSIS ACCESS (AVF, AVG)  Result Date: 02/18/2022 DIALYSIS ACCESS Patient Name:  [name].  Date of Exam:   02/17/2022 Medical Rec #: [id number]             Accession #:    [id number] Date of Birth: 1970-11-03              Patient Gender: M Patient Age:   50 years Exam Location:  [facility] Procedure:      VAS US DUPLEX DIALYSIS ACCESS (AVF, AVG) Referring Phys: [name] --------------------------------------------------------------------------------  Reason for Exam: Routine follow up. Access Site: Left Upper Extremity. Access Type: Brachial-cephalic  AVF. History: S/P PTA of mid cephalic vein 61/6073. Performing Technologist: Concha Norway RVT  Examination Guidelines: A complete evaluation includes B-mode imaging, spectral Doppler, color Doppler, and power Doppler as needed of all accessible portions of each vessel. Unilateral testing is considered an integral part of a complete examination. Limited examinations for reoccurring indications may be performed as noted.  Findings: +--------------------+----------+-----------------+--------+ AVF  PSV (cm/s)Flow Vol (mL/min)Comments +--------------------+----------+-----------------+--------+ Native artery inflow   276          2830                +--------------------+----------+-----------------+--------+ AVF Anastomosis        411                              +--------------------+----------+-----------------+--------+  +---------------+----------+-------------+----------+--------+ OUTFLOW VEIN   PSV (cm/s)Diameter (cm)Depth (cm)Describe +---------------+----------+-------------+----------+--------+ Subclavian vein    83                                    +---------------+----------+-------------+----------+--------+ Confluence        158                                    +---------------+----------+-------------+----------+--------+ Prox UA           360                                    +---------------+----------+-------------+----------+--------+ Mid UA            342                                    +---------------+----------+-------------+----------+--------+ Dist UA           477                                    +---------------+----------+-------------+----------+--------+  +---------------+------------+----------+---------+--------+------------------+                  Diameter  Depth (cm)Branching  PSV      Flow Volume                        (cm)                        (cm/s)      (ml/min)       +---------------+------------+----------+---------+--------+------------------+ Left rad art                                     44                      dis                                                                      +---------------+------------+----------+---------+--------+------------------+ antegrade                                                                +---------------+------------+----------+---------+--------+------------------+  Summary: Patent arteriovenous fistula.  Improved flow volume s/p PTA of mid cephalic vein.  *See table(s) above for measurements and observations.  Diagnosing physician: [name] MD Electronically signed by [name] MD on 02/18/2022 at 9:57:57 AM.   --------------------------------------------------------------------------------   Final     Microbiology: Results for orders placed or performed during the hospital encounter of 02/26/22  Blood culture (routine x 2)     Status: None (Preliminary result)   Collection Time: 02/26/22  5:22 PM   Specimen: BLOOD  Result Value Ref Range Status   Specimen Description BLOOD BLOOD RIGHT ARM  Final   Special Requests   Final    BOTTLES DRAWN AEROBIC AND ANAEROBIC Blood Culture adequate volume   Culture   Final    NO GROWTH 2 DAYS Performed at [facility], [address] [place], [place] [address]    Report Status PENDING  Incomplete  Blood culture (routine x 2)     Status: None (Preliminary result)   Collection Time: 02/27/22 12:55 AM   Specimen: BLOOD  Result Value Ref Range Status   Specimen Description BLOOD BLOOD RIGHT HAND  Final   Special Requests   Final    BOTTLES DRAWN AEROBIC AND ANAEROBIC Blood Culture results may not be optimal due to an inadequate volume of blood received in culture bottles   Culture   Final    NO GROWTH 1 DAY Performed at [facility], [address] [place], [place] [address]    Report Status PENDING  Incomplete     Labs: CBC: Recent Labs  Lab 02/26/22 1445 02/27/22 0055 02/27/22 1236 02/28/22 0850  WBC 6.5  --  5.4 5.6  NEUTROABS 3.5  --   --   --   HGB 7.8* 8.1* 7.7* 8.4*  HCT 27.4*  --  26.6* 28.8*  MCV 72.1*  --  71.5* 70.6*  PLT 154  --  137* 494*   Basic Metabolic Panel: Recent Labs  Lab 02/26/22 1445 02/27/22 1236  NA 137 137  K 4.1 4.1  CL 105 107  CO2 22 22  GLUCOSE 76 101*  BUN 40* 44*  CREATININE 6.62* 7.62*  CALCIUM 8.4* 7.9*  MG  --  1.9  PHOS  --  5.2*   Liver Function Tests: Recent Labs  Lab 02/26/22 1445 02/27/22 1236  AST 13*  --   ALT 12  --   ALKPHOS 78  --   BILITOT 0.8  --   PROT 6.7  --   ALBUMIN 3.3* 2.7*   CBG: Recent Labs  Lab 02/28/22 0542 02/28/22 0610  GLUCAP 61* 120*    Discharge time spent: {LESS THAN/GREATER THAN:26388} 30 minutes.  Signed: Jennye Boroughs, MD [facility] 02/28/2022

## 2022-02-28 NOTE — Progress Notes (Signed)
   02/27/22 1900  Assess: MEWS Score  BP (!) 204/98 (MD Notified)  Assess: MEWS Score  MEWS Temp 0  MEWS Systolic 2  MEWS Pulse 0  MEWS RR 0  MEWS LOC 0  MEWS Score 2  MEWS Score Color Yellow  Assess: if the MEWS score is Yellow or Red  Were vital signs taken at a resting state? Yes  Focused Assessment No change from prior assessment  MEWS guidelines implemented *See Row Information* Yes (MEWS yellow initiated on day shift at 1700)  Treat  MEWS Interventions Administered scheduled meds/treatments  Take Vital Signs  Increase Vital Sign Frequency  Yellow: Q 2hr X 2 then Q 4hr X 2, if remains yellow, continue Q 4hrs  Escalate  MEWS: Escalate Yellow: discuss with charge nurse/RN and consider discussing with provider and RRT  Notify: Charge Nurse/RN  Name of Charge Nurse/RN Notified Lorriane Shire, RN  Date Charge Nurse/RN Notified 02/27/22  Time Charge Nurse/RN Notified 1900  Provider Notification  Provider Name/Title Neomia Glass, NP  Date Provider Notified 02/27/22  Time Provider Notified 1927  Method of Notification  (epic secure chat)  Notification Reason Other (Comment) (Day shift RN notified provider regarding blood pressure. Night shift RN was added to secure chat by day shift RN.)  Provider response Other (Comment) (signed and held orders released)  Date of Provider Response 02/27/22  Time of Provider Response 1935  Notify: Rapid Response  Name of Rapid Response RN Notified Not needed at this time  Document  Patient Outcome Other (Comment) (stable/ treatment ongoing)  Progress note created (see row info) Yes

## 2022-03-01 ENCOUNTER — Emergency Department (HOSPITAL_COMMUNITY): Payer: Medicare HMO

## 2022-03-01 ENCOUNTER — Telehealth: Payer: Self-pay | Admitting: Podiatry

## 2022-03-01 ENCOUNTER — Other Ambulatory Visit: Payer: Self-pay

## 2022-03-01 ENCOUNTER — Emergency Department (HOSPITAL_COMMUNITY)
Admission: EM | Admit: 2022-03-01 | Discharge: 2022-03-01 | Disposition: A | Discharge status: Home / Self Care | Payer: Medicare HMO | Attending: Emergency Medicine | Admitting: Emergency Medicine

## 2022-03-01 ENCOUNTER — Encounter: Payer: Self-pay | Admitting: Podiatry

## 2022-03-01 DIAGNOSIS — Z20822 Contact with and (suspected) exposure to covid-19: Secondary | ICD-10-CM | POA: Diagnosis not present

## 2022-03-01 DIAGNOSIS — R29818 Other symptoms and signs involving the nervous system: Secondary | ICD-10-CM | POA: Diagnosis not present

## 2022-03-01 DIAGNOSIS — N186 End stage renal disease: Secondary | ICD-10-CM | POA: Insufficient documentation

## 2022-03-01 DIAGNOSIS — Z79899 Other long term (current) drug therapy: Secondary | ICD-10-CM | POA: Diagnosis not present

## 2022-03-01 DIAGNOSIS — R531 Weakness: Secondary | ICD-10-CM | POA: Insufficient documentation

## 2022-03-01 LAB — CBC
HCT: 28.6 % — ABNORMAL LOW (ref 39.0–52.0)
Hemoglobin: 8.5 g/dL — ABNORMAL LOW (ref 13.0–17.0)
MCH: 21.3 pg — ABNORMAL LOW (ref 26.0–34.0)
MCHC: 29.7 g/dL — ABNORMAL LOW (ref 30.0–36.0)
MCV: 71.5 fL — ABNORMAL LOW (ref 80.0–100.0)
Platelets: 122 10*3/uL — ABNORMAL LOW (ref 150–400)
RBC: 4 MIL/uL — ABNORMAL LOW (ref 4.22–5.81)
RDW: 28.1 % — ABNORMAL HIGH (ref 11.5–15.5)
WBC: 6.8 10*3/uL (ref 4.0–10.5)
nRBC: 0 % (ref 0.0–0.2)

## 2022-03-01 LAB — DIFFERENTIAL
Abs Immature Granulocytes: 0.03 10*3/uL (ref 0.00–0.07)
Basophils Absolute: 0 10*3/uL (ref 0.0–0.1)
Basophils Relative: 1 %
Eosinophils Absolute: 0.3 10*3/uL (ref 0.0–0.5)
Eosinophils Relative: 4 %
Immature Granulocytes: 0 %
Lymphocytes Relative: 17 %
Lymphs Abs: 1.2 10*3/uL (ref 0.7–4.0)
Monocytes Absolute: 0.9 10*3/uL (ref 0.1–1.0)
Monocytes Relative: 13 %
Neutro Abs: 4.4 10*3/uL (ref 1.7–7.7)
Neutrophils Relative %: 65 %
Smear Review: NORMAL

## 2022-03-01 LAB — APTT: aPTT: 31 seconds (ref 24–36)

## 2022-03-01 LAB — I-STAT CHEM 8, ED
BUN: 38 mg/dL — ABNORMAL HIGH (ref 6–20)
Calcium, Ion: 1.07 mmol/L — ABNORMAL LOW (ref 1.15–1.40)
Chloride: 103 mmol/L (ref 98–111)
Creatinine, Ser: 8.3 mg/dL — ABNORMAL HIGH (ref 0.61–1.24)
Glucose, Bld: 90 mg/dL (ref 70–99)
HCT: 31 % — ABNORMAL LOW (ref 39.0–52.0)
Hemoglobin: 10.5 g/dL — ABNORMAL LOW (ref 13.0–17.0)
Potassium: 4.2 mmol/L (ref 3.5–5.1)
Sodium: 138 mmol/L (ref 135–145)
TCO2: 24 mmol/L (ref 22–32)

## 2022-03-01 LAB — COMPREHENSIVE METABOLIC PANEL
ALT: 9 U/L (ref 0–44)
AST: 11 U/L — ABNORMAL LOW (ref 15–41)
Albumin: 3 g/dL — ABNORMAL LOW (ref 3.5–5.0)
Alkaline Phosphatase: 83 U/L (ref 38–126)
Anion gap: 10 (ref 5–15)
BUN: 39 mg/dL — ABNORMAL HIGH (ref 6–20)
CO2: 24 mmol/L (ref 22–32)
Calcium: 8.1 mg/dL — ABNORMAL LOW (ref 8.9–10.3)
Chloride: 102 mmol/L (ref 98–111)
Creatinine, Ser: 7.54 mg/dL — ABNORMAL HIGH (ref 0.61–1.24)
GFR, Estimated: 8 mL/min — ABNORMAL LOW (ref >60)
Glucose, Bld: 94 mg/dL (ref 70–99)
Potassium: 4 mmol/L (ref 3.5–5.1)
Sodium: 136 mmol/L (ref 135–145)
Total Bilirubin: 0.8 mg/dL (ref 0.3–1.2)
Total Protein: 6.3 g/dL — ABNORMAL LOW (ref 6.5–8.1)

## 2022-03-01 LAB — GLUCOSE, CAPILLARY: Glucose-Capillary: 66 mg/dL — ABNORMAL LOW (ref 70–99)

## 2022-03-01 LAB — RESP PANEL BY RT-PCR (RSV, FLU A&B, COVID)  RVPGX2
Influenza A by PCR: NEGATIVE
Influenza B by PCR: NEGATIVE
Resp Syncytial Virus by PCR: NEGATIVE
SARS Coronavirus 2 by RT PCR: NEGATIVE

## 2022-03-01 LAB — PROTIME-INR
INR: 1.1 (ref 0.8–1.2)
Prothrombin Time: 14.3 seconds (ref 11.4–15.2)

## 2022-03-01 LAB — ETHANOL: Alcohol, Ethyl (B): <10 mg/dL (ref <10)

## 2022-03-01 LAB — CBG MONITORING, ED: Glucose-Capillary: 87 mg/dL (ref 70–99)

## 2022-03-01 MED ORDER — CARVEDILOL 12.5 MG PO TABS
25.0000 mg | ORAL_TABLET | Freq: Once | ORAL | Status: AC
  Administered 2022-03-01: 25 mg via ORAL
  Filled 2022-03-01: qty 2

## 2022-03-01 MED ORDER — CLONIDINE HCL 0.2 MG PO TABS
0.3000 mg | ORAL_TABLET | Freq: Once | ORAL | Status: AC
  Administered 2022-03-01: 0.3 mg via ORAL
  Filled 2022-03-01: qty 1

## 2022-03-01 MED ORDER — SODIUM CHLORIDE 0.9% FLUSH: 3.0000 mL | Freq: Once | INTRAVENOUS | Status: DC

## 2022-03-01 MED ORDER — HYDRALAZINE HCL 20 MG/ML IJ SOLN: 10.0000 mg | Freq: Once | INTRAMUSCULAR | Status: DC

## 2022-03-01 MED ORDER — IOHEXOL 350 MG/ML SOLN
75.0000 mL | Freq: Once | INTRAVENOUS | Status: AC | PRN
  Administered 2022-03-01: 75 mL via INTRAVENOUS

## 2022-03-01 NOTE — Code Documentation (Signed)
Stroke Response Nurse Documentation Code Documentation  [name]. is a 52 y.o. male arriving to [facility]  via [facility] on 03/01/22 with past medical hx of hypertension, coronary artery disease, congestive heart failure, history of CVA, diabetes, peripheral vascular disease, history of osteomyelitis, end-stage renal disease with anemia on dialysis . On No antithrombotic. Code stroke was activated by EMS.   Patient from home where he was LKW at 1700 on 02/28/22 and now complaining of left arm jerking, speech difficulty. Patient was admitted to [facility] on Friday (per pt's wife) for an infection into the bone on patient's right foot. Pt received dialysis on Saturday in the hospital and had a toe amputation on Sunday. He was treated with Keppra for some jerking in his left arm in the hospital before he went home. [name] said he still had some jerking that afternoon and then this am but was still able to talk until this am around 0600 when his speech changed. Wife says this has happened in the past when he was having a stroke. EMS was called and patient was taken to [facility]. Pt hypertensive with sys in the 200's.   Stroke team at the bedside on patient arrival. Labs drawn and patient cleared for CT by Dr. Ronnald Nian. Patient to CT with team. NIHSS 1, see documentation for details and code stroke times. Patient with left limb ataxia on exam. The following imaging was completed:  CT Head, CTA, and CTP. Patient is not a candidate for IV Thrombolytic due to outside the window and history of cerebral hemorrhage in September. Patient is not a candidate for IR due to no LVO per MD.   Care Plan: Q2x12 then Q4 NIHSS/vitals, permissive HTN.   Bedside handoff with ED RN Legrand Como.    Barba Solt, Rande Brunt  Stroke Response RN

## 2022-03-01 NOTE — ED Triage Notes (Signed)
Pt BIB [facility] with reports of left sided weakness and aphasia that started yesterday at 1700. PT was paged as Code Stroke PTA.

## 2022-03-01 NOTE — Consult Note (Signed)
Neurology Consultation Reason for Consult: Tremors Referring Physician: Vernell Barrier, A  CC: Tremors  History is obtained from: Patient, wife  HPI: [name]. is a 52 y.o. male with a history of atrial fibrillation, seizure in the setting of missed dialysis and cardiac arrest, fourth ventricular hemorrhage in the setting of hypertensive emergency, multifocal myoclonus who presents with increasing tremors.  He was just admitted for osteomyelitis with amputation of his toe yesterday following which she was discharged home.  At home, he states that he got very little sleep, was feeling "off" even prior to bed last night and then this morning was having increasing tremors and difficulty speaking and therefore EMS was called.  EMS activated a code stroke and he was evaluated as such on arrival to the emergency department.  Of note, he has had similar episodes of difficulty speaking and tremors when sick or in the hospital in the past as well.  LKW: 1/7 tnk given?: no, outside window  Past Medical History:  Diagnosis Date   Anxiety    a.) on BZO (clonazepam) PRN   Aortic dilatation ([place]) 11/25/2020   a.) TTE 11/25/2020: Ao root measured 38 mm. b.) TTE 04/22/2021: Ao root measured 44 mm; c. 11/2021 Echo: Ao root 20mm.   Atrial fibrillation ([place])    a.) CHA2DS2-VASc = 6 (CHF, HTN, CVA x2, prior MI, T2DM). b.) rate/rhythm maintained on oral amiodarone + carvedilol; chronically anticoagulated using dose reduced apixaban + clopidogrel   BPH (benign prostatic hyperplasia)    Cardiac arrest ([name]) 07/26/2021   a.) in setting of hyperkalemia, NSTEMI, and seizure following missed HD; pulseless and apneic --> required 1 round of CPR prior to ROSC   Cellulitis    Cerebral hemorrhage ([place])    a. 10/2021 interventricular hemorrhage in setting of HTN emergency and DOAC/APT-->eliquis/plavix d/c'd.   Chronic diarrhea    Chronic heart failure with preserved ejection fraction (HFpEF) ([place]) 07/19/2017    a. 06/2017 Echo: EF 75%; b. 11/2020 Echo: EF 55-60%; c. 04/2021 Echo: EF 55-60%; d. 11/2021 Echo: EF 65-70%, GrII DD, nl RV fxn, sev dil LA, large circumferential pericardial eff w/o tamponade, triv MR, AoV sclerosis. Ao root 94mm.   Chronic pain of both ankles    Coronary artery disease 02/26/2020   a. 02/2020 Ant STEMI/PCI: LM nl, LAD 22m (3.5x30 Resolute Onyx), RI nl, LCX min irregs, RCA min irregs. EF 65%.   Diabetes mellitus, type II ([place])    Diabetic neuropathy ([place])    Esophagitis    ESRD (end stage renal disease) on dialysis ([place])    a.) [facility]; T-Th-Sat   Frequent falls    Gait instability    Gastritis    Gastroparesis    GERD (gastroesophageal reflux disease)    H/O enucleation of left eyeball    Heart palpitations    History of kidney stones    HLD (hyperlipidemia)    Hypertension    Insomnia    a.) uses melatonin PRN   Nausea and vomiting in adult    recurrent   NSTEMI (non-ST elevated myocardial infarction) ([place]) 07/26/2021   a.) hyperkalemic from missed HD; seizures; decompensated to cardiac arrest and required CPR x 1 round prior to ROSC.   Osteomyelitis ([facility])    Pericardial effusion    a. 11/2021 Echo: EF 65-70%, GrII DD, nl RV fxn, sev dil LA, large circumferential pericardial eff w/o tamponade, triv MR, AoV sclerosis. Ao root 64mm.   Seizure ([place])    a.) last 07/26/2021 in setting of  missed HD --> hyperkalemic at 6.5 --> pulseless/apneic and required CPR; discharged home on levetiracetam.   ST elevation myocardial infarction (STEMI) of anterior wall ([place]) 02/26/2020   a.) LHC/PCI 02/26/2020 --> EF 65%; LVEDP 11 mmHg; 90% mLAD (3.5 x 20 mm Resolute Onyx DES x 1)   Stroke ([place]) 04/2021   a.) documented as x 3 between 04/2021 and 06/2021     Family History  Problem Relation Age of Onset   CAD Father    Stroke Father    Diabetes Mellitus II Mother    Kidney failure Mother    Schizophrenia Mother      Social History:  reports that he has never smoked. He  has never used smokeless tobacco. He reports that he does not drink alcohol and does not use drugs.   Exam: Current vital signs: BP (!) 224/62   Pulse 66   Temp 98.2 F (36.8 C) (Oral)   Resp 11   Wt 87.6 kg   SpO2 98%   BMI 24.14 kg/m  Vital signs in last 24 hours: Temp:  [97.8 F (36.6 C)-98.2 F (36.8 C)] 98.2 F (36.8 C) (01/08 0815) Pulse Rate:  [58-66] 66 (01/08 0815) Resp:  [5-16] 11 (01/08 0815) BP: (130-224)/(62-80) 224/62 (01/08 0815) SpO2:  [95 %-100 %] 98 % (01/08 0815) Weight:  [87.6 kg] 87.6 kg (01/08 0700)   Physical Exam  Appears well-developed and well-nourished.   Neuro: Mental Status: Patient is awake, alert, he is unable to give me the month. At times it appears that he is having increased latency of speech, but at other times he speaks relatively fluently.,  He is able to name objects, though it does take him some time to get the words out. Cranial Nerves: II: Visual Fields are full. R pupil slightly irregular, sluggish but reactive, left enucleation III,IV, VI: EOMI in the right eye VII: Facial movement with?  Left facial weakness Motor: His exam is markedly effort dependent, at times stating that he cannot lift his left arm at all, and at other times when distracted he lifts it and hold it without drift.  He has a clearly distractible tremor. Sensory: He endorses sensation symmetrically Cerebellar: He has no ataxia on finger-nose-finger, though when performing it on the left he does start to have a distractible tremor.     I have reviewed labs in epic and the results pertinent to this consultation are: BUN 38 Ionized calcium 1.07 Hemoglobin 8.5(stable from previous)  I have reviewed the images obtained: CT/CTA-negative  Impression: 52 year old male with a history of multiple medical problems including stroke, hemorrhage, seizure who presents with increasing tremors.  My suspicion is that this possibly represents recrudescence in the setting  of his acute physiological stress (postoperative).  The distractible nature of his tremors as well as the inconsistency of his exam does raise the question of psychogenic component to his presentation.   The tremors that I witnessed are similar to the one seen at home, and do not appear to be seizure activity.  Recommendations: 1) MRI brain 2) continue home Keppra 3) if MRI is negative, would treat with supportive care and neurology will be available as needed.   Roland Rack, MD [facility] [phone]  If 7pm- 7am, please page neurology on call as listed in [place].

## 2022-03-01 NOTE — ED Notes (Signed)
Pt transported to MRI. NIH will be obtained when patient returns.

## 2022-03-01 NOTE — Telephone Encounter (Signed)
error 

## 2022-03-01 NOTE — ED Provider Notes (Signed)
[facility] EMERGENCY DEPARTMENT Provider Note   CSN: [id number] Arrival date & time: 03/01/22  7017  An emergency department physician performed an initial assessment on this suspected stroke patient at 103.  History  Chief Complaint  Patient presents with   Code Stroke    [name] Johnrobert Foti. is a 52 y.o. male.  Per EMS patient last seen normal around 530 yesterday.  Was just discharged from the hospital at [facility] following right toe amputation for osteomyelitis.  He had dialysis yesterday he states.  Possibly some aphasia, left-sided weakness.  Concern for LVO made code stroke in the field.  History of stroke, seizures, end-stage renal disease.  Denies any chest pain or shortness of breath.  The history is provided by the patient and the EMS personnel.       Home Medications Prior to Admission medications   Medication Sig Start Date End Date Taking? Authorizing Provider  acarbose (PRECOSE) 50 MG tablet Take 50 mg by mouth 3 (three) times daily. 10/20/21   [provider]  acetaminophen (TYLENOL) 325 MG tablet Take 1-2 tablets (325-650 mg total) by mouth every 4 (four) hours as needed for mild pain. 05/04/21   Love, Ivan Anchors, PA-C  amiodarone (PACERONE) 200 MG tablet Take 1 tablet (200 mg total) by mouth every morning. 08/10/21 08/11/22  Swinyer, Lanice Schwab, NP  amLODipine (NORVASC) 10 MG tablet Take 1 tablet (10 mg total) by mouth daily. 02/28/22   Jennye Boroughs, MD  atorvastatin (LIPITOR) 80 MG tablet Take 1 tablet (80 mg total) by mouth daily. 05/04/21 04/29/22  Love, Ivan Anchors, PA-C  calcium acetate (PHOSLO) 667 MG capsule Take 2 capsules (1,334 mg total) by mouth 3 (three) times daily with meals. 05/04/21   Love, Ivan Anchors, PA-C  carvedilol (COREG) 25 MG tablet Take 25 mg by mouth 2 (two) times daily. 06/16/21   [provider]  cloNIDine (CATAPRES) 0.3 MG tablet Take 1 tablet (0.3 mg total) by mouth every 8 (eight) hours. Patient taking  differently: Take 0.3 mg by mouth every 8 (eight) hours. Only if BP over 200 12/03/21   Bonnielee Haff, MD  dorzolamide-timolol (COSOPT) 22.3-6.8 MG/ML ophthalmic solution Place 1 drop into the right eye 2 (two) times daily.    [provider]  irbesartan (AVAPRO) 300 MG tablet Take 1 tablet (300 mg total) by mouth daily. 12/03/21   Bonnielee Haff, MD  levETIRAcetam (KEPPRA) 250 MG tablet Take 1 tablet (250 mg total) by mouth Every Tuesday,Thursday,and Saturday with dialysis. 07/30/21   Nita Sells, MD  levETIRAcetam (KEPPRA) 750 MG tablet Take 1 tablet (750 mg total) by mouth 2 (two) times daily. Patient taking differently: Take 750 mg by mouth 2 (two) times daily. Twice daily Only on dialysis days and once on all other days 12/05/21   Bonnielee Haff, MD  lidocaine-prilocaine (EMLA) cream Apply topically. 12/24/21   [provider]  omeprazole (PRILOSEC) 40 MG capsule Take 40 mg by mouth as needed.    [provider]      Allergies    Promethazine, Promethazine hcl, Other, and Malt    Review of Systems   Review of Systems  Physical Exam Updated Vital Signs BP (!) 152/55   Pulse 62   Temp 98 F (36.7 C) (Oral)   Resp 15   Wt 87.6 kg   SpO2 100%   BMI 24.14 kg/m  Physical Exam Vitals and nursing note reviewed.  Constitutional:      General: He is not  in acute distress.    Appearance: He is well-developed. He is not ill-appearing.  HENT:     Head: Normocephalic and atraumatic.  Eyes:     Conjunctiva/sclera: Conjunctivae normal.  Cardiovascular:     Rate and Rhythm: Normal rate and regular rhythm.     Heart sounds: No murmur heard. Pulmonary:     Effort: Pulmonary effort is normal. No respiratory distress.     Breath sounds: Normal breath sounds.  Abdominal:     Palpations: Abdomen is soft.     Tenderness: There is no abdominal tenderness.  Musculoskeletal:        General: No swelling.     Cervical back: Neck supple.  Skin:    General:  Skin is warm and dry.     Capillary Refill: Capillary refill takes less than 2 seconds.     Comments: Surgical site in the right foot clean dry and intact  Neurological:     Mental Status: He is alert.     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     ED Results / Procedures / Treatments   Labs (all labs ordered are listed, but only abnormal results are displayed) Labs Reviewed  CBC - Abnormal; Notable for the following components:      Result Value   RBC 4.00 (*)    Hemoglobin 8.5 (*)    HCT 28.6 (*)    MCV 71.5 (*)    MCH 21.3 (*)    MCHC 29.7 (*)    RDW 28.1 (*)    Platelets 122 (*)    All other components within normal limits  COMPREHENSIVE METABOLIC PANEL - Abnormal; Notable for the following components:   BUN 39 (*)    Creatinine, Ser 7.54 (*)    Calcium 8.1 (*)    Total Protein 6.3 (*)    Albumin 3.0 (*)    AST 11 (*)    GFR, Estimated 8 (*)    All other components within normal limits  I-STAT CHEM 8, ED - Abnormal; Notable for the following components:   BUN 38 (*)    Creatinine, Ser 8.30 (*)    Calcium, Ion 1.07 (*)    Hemoglobin 10.5 (*)    HCT 31.0 (*)    All other components within normal limits  RESP PANEL BY RT-PCR (RSV, FLU A&B, COVID)  RVPGX2  PROTIME-INR  APTT  DIFFERENTIAL  ETHANOL  CBG MONITORING, ED  I-STAT CHEM 8, ED  CBG MONITORING, ED    EKG None  Radiology MR BRAIN WO CONTRAST  Result Date: 03/01/2022 CLINICAL DATA:  Neuro deficit, acute, stroke suspected EXAM: MRI HEAD WITHOUT CONTRAST TECHNIQUE: Multiplanar, multiecho pulse sequences of the brain and surrounding structures were obtained without intravenous contrast. COMPARISON:  CT head from the same day.  MRI head 11/21/2021. FINDINGS: Brain: No acute infarction, acute hemorrhage, hydrocephalus,  extra-axial collection or mass lesion. 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 Remote bifrontal cortical infarcts are similar. Vascular: Major arterial flow voids are maintained at the skull base. Skull and upper cervical spine: Normal marrow signal. Sinuses/Orbits: Mild paranasal sinus mucosal thickening. No acute orbital findings. Chronic left globe findings. Other: No mastoid effusions. IMPRESSION: 1. No evidence of acute intracranial abnormality. 2. Similar remote bifrontal cortical  infarcts. 3. 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 Electronically Signed   By: Margaretha Sheffield M.D.   On: 03/01/2022 10:54   DG Chest Portable 1 View  Result Date: 03/01/2022 CLINICAL DATA:  52 year old male with left side weakness, code stroke presentation this morning. EXAM: PORTABLE CHEST 1 VIEW COMPARISON:  Chest CT 11/21/2021 and earlier. FINDINGS: Portable AP semi upright view at 0921 hours. Right chest dual lumen dialysis catheter appears stable since September. Stable cardiomegaly and mediastinal contours, with pericardial effusion present in September. No superimposed pneumothorax, pulmonary edema, pleural effusion or consolidation. No acute osseous abnormality identified. Negative visible bowel gas. IMPRESSION: 1. Stable cardiomegaly, with ongoing pericardial effusion suspected as demonstrated by CT in September. 2. No superimposed acute cardiopulmonary abnormality. Electronically Signed   By: Genevie [name] M.D.   On: 03/01/2022 09:32   CT ANGIO HEAD NECK W WO CM  Result Date: 03/01/2022 CLINICAL DATA:  Stroke, follow-up. Additional history obtained from EMR: Left-sided weakness, aphasia. EXAM: CT ANGIOGRAPHY HEAD AND NECK TECHNIQUE: Multidetector CT imaging of the head and neck was performed using the standard protocol during bolus administration of intravenous contrast.  Multiplanar CT image reconstructions and MIPs were obtained to evaluate the vascular anatomy. Carotid stenosis measurements (when applicable) are obtained utilizing NASCET criteria, using the distal internal carotid diameter as the denominator. RADIATION DOSE REDUCTION: This exam was performed according to the departmental dose-optimization program which includes automated exposure control, adjustment of the mA and/or kV according to patient size and/or use of iterative reconstruction technique. CONTRAST:  42mL OMNIPAQUE IOHEXOL 350 MG/ML SOLN COMPARISON:  Noncontrast head CT performed earlier today 03/01/2022. CT angiogram head/neck 11/21/2021. FINDINGS: CTA NECK FINDINGS Aortic arch: Standard aortic branching. The visualized aortic arch is normal in caliber. Atherosclerotic plaque within the proximal major branch vessels of the neck. No hemodynamically significant stenosis within the innominate or proximal subclavian arteries. Right carotid system: CCA and ICA patent within the neck without stenosis. Minimal atherosclerotic plaque within the distal CCA. Left carotid system: CCA and ICA patent within the neck without stenosis. Minimal atherosclerotic plaque about the carotid bifurcation and within the proximal ICA. Vertebral arteries: Vertebral arteries patent within the neck without hemodynamically significant stenosis. Mild atherosclerotic plaque scattered within the dominant cervical right vertebral artery. Skeleton: Cervical spondylosis. No acute fracture or aggressive osseous lesion. Other neck: Nonspecific 6.4 x 3.7 cm focus of increased density within the skin and subcutaneous soft tissues and occipital scalp, increased in size from the prior CT angiogram head/neck 11/21/2021 (for instance as seen on series 9, image 99) (series 7, image 147). No cervical lymphadenopathy. Upper chest: No consolidation within the imaged lung apices. Review of the MIP images confirms the above findings CTA HEAD FINDINGS  Anterior circulation: The intracranial internal carotid arteries are patent. Nonstenotic atherosclerotic plaque within both vessels. The M1 middle cerebral arteries are patent. No M2 proximal branch occlusion or high-grade proximal stenosis. The anterior cerebral arteries are patent. No intracranial aneurysm is identified. Posterior circulation: The intracranial vertebral arteries are patent. Nonstenotic atherosclerotic plaque within both vessels. The basilar artery is patent. The posterior cerebral arteries are patent. A right posterior communicating artery is present. The left posterior communicating artery is diminutive or absent the Venous sinuses: Within the limitations of contrast timing, no convincing thrombus. Anatomic variants: As described. Review of the MIP images confirms the above findings No emergent large vessel occlusion identified. These results were communicated to Dr. Leonel Ramsay at 8:34 amon 1/8/2024by text  page via the [facility] messaging system. IMPRESSION: CTA neck: 1. The common carotid, internal carotid and vertebral arteries are patent within the neck without hemodynamically significant stenosis. Mild atherosclerotic disease within these vessels in the neck, as described. 2. Nonspecific 6.4 x 3.7 cm lesion within the skin and subcutaneous soft tissues of the posterior upper neck and occipital scalp. Direct visualization recommended. CTA head: Intracranial atherosclerotic disease, as described. No intracranial large vessel occlusion or proximal high-grade arterial stenosis identified. Electronically Signed   By: Kellie Simmering D.O.   On: 03/01/2022 08:34   CT HEAD CODE STROKE WO CONTRAST  Result Date: 03/01/2022 CLINICAL DATA:  Code stroke. EXAM: CT HEAD WITHOUT CONTRAST TECHNIQUE: Contiguous axial images were obtained from the base of the skull through the vertex without intravenous contrast. RADIATION DOSE REDUCTION: This exam was performed according to the departmental dose-optimization  program which includes automated exposure control, adjustment of the mA and/or kV according to patient size and/or use of iterative reconstruction technique. COMPARISON:  01/09/2022 FINDINGS: Brain: No evidence of acute infarction, hemorrhage, hydrocephalus, extra-axial collection or mass lesion/mass effect. Small chronic infarcts at the superior and anterior frontal lobes. Chronic small vessel ischemia in the cerebral white matter. Chronic lacunar infarcts at the lower left thalamus. Vascular: Extensive arterial calcification in the setting of end-stage renal disease. Skull: Normal. Negative for fracture or focal lesion. Sinuses/Orbits: No acute finding. Other: These results were communicated to Dr. Leonel Ramsay at 7:59 am on 03/01/2022 by text page via the [facility] messaging system. IMPRESSION: 1. No acute finding. 2. Chronic small vessel disease and small bifrontal cortex infarcts. Electronically Signed   By: Jorje Guild M.D.   On: 03/01/2022 07:59   ECHOCARDIOGRAM LIMITED  Result Date: 02/28/2022    ECHOCARDIOGRAM LIMITED REPORT   Patient Name:   [name] Pargas. Date of Exam: 02/28/2022 Medical Rec #:  [id number]            Height:       75.0 in Accession #:    [id number]           Weight:       187.4 lb Date of Birth:  Sep 27, 1970             BSA:          2.134 m Patient Age:    35 years             BP:           162/84 mmHg Patient Gender: M                    HR:           60 bpm. Exam Location:  [name] Procedure: Limited Echo and Cardiac Doppler Indications:     Pericardial effusion I31.3  History:         Patient has prior history of Echocardiogram examinations, most                  recent 11/22/2021.  Sonographer:     Kathlen Brunswick RDCS Referring Phys:  [id number] CHRISTOPHER RONALD BERGE Diagnosing Phys: Fransico Him MD IMPRESSIONS  1. Left ventricular ejection fraction, by estimation, is 60 to 65%. The left ventricle has normal function. The left ventricle has no regional wall motion abnormalities. There is  severe concentric left ventricular hypertrophy.  2. Right ventricular systolic function is normal. The right ventricular size is normal. Tricuspid regurgitation signal is inadequate for assessing PA pressure.  3. The mitral  valve is normal in structure. No evidence of mitral valve regurgitation. No evidence of mitral stenosis. Severe mitral annular calcification.  4. The aortic valve is normal in structure. Aortic valve regurgitation is not visualized. Aortic valve sclerosis/calcification is present, without any evidence of aortic stenosis.  5. The inferior vena cava is normal in size with greater than 50% respiratory variability, suggesting right atrial pressure of 3 mmHg.  6. There is a moderate circumferential pericardial with greatest diameter measuring 2.84cm. There is RA inversion but no RV diastolic collapse. There is no significant respirophasic changes in MV inflow velocities. THe IVC is not dilated. There is no evidence of cardiac tamponade.  7. Compared to study dated 02/26/20, the pericardial effusion is slightly larger. Also, with severe LVH would consider PYP scan to rule out amyloidosis. FINDINGS  Left Ventricle: Left ventricular ejection fraction, by estimation, is 60 to 65%. The left ventricle has normal function. The left ventricle has no regional wall motion abnormalities. The left ventricular internal cavity size was normal in size. There is  severe concentric left ventricular hypertrophy. Right Ventricle: The right ventricular size is normal. No increase in right ventricular wall thickness. Right ventricular systolic function is normal. Tricuspid regurgitation signal is inadequate for assessing PA pressure. Left Atrium: Left atrial size was normal in size. Right Atrium: Right atrial size was normal in size. Pericardium: There is a circumferential pericardial effusion with greatest diameter 2.84cm. A moderately sized pericardial effusion is present. The pericardial effusion is posterior to the left  ventricle. There is diastolic collapse of the right atrial wall. There is no evidence of cardiac tamponade. Mitral Valve: The mitral valve is normal in structure. Severe mitral annular calcification. No evidence of mitral valve stenosis. Tricuspid Valve: The tricuspid valve is normal in structure. Tricuspid valve regurgitation is not demonstrated. No evidence of tricuspid stenosis. Aortic Valve: The aortic valve is normal in structure. Aortic valve regurgitation is not visualized. Aortic valve sclerosis/calcification is present, without any evidence of aortic stenosis. Pulmonic Valve: The pulmonic valve was normal in structure. Pulmonic valve regurgitation is not visualized. No evidence of pulmonic stenosis. Aorta: The aortic root is normal in size and structure. Venous: The inferior vena cava is normal in size with greater than 50% respiratory variability, suggesting right atrial pressure of 3 mmHg. IAS/Shunts: No atrial level shunt detected by color flow Doppler. LEFT VENTRICLE PLAX 2D LVIDd:         4.60 cm LVIDs:         3.20 cm LV PW:         2.20 cm LV IVS:        2.10 cm  LEFT ATRIUM         Index LA diam:    4.70 cm 2.20 cm/m Fransico Him MD Electronically signed by Fransico Him MD Signature Date/Time: 02/28/2022/9:55:42 AM    Final     Procedures Procedures    Medications Ordered in ED Medications  sodium chloride flush (NS) 0.9 % injection 3 mL (0 mLs Intravenous Hold 03/01/22 0910)  iohexol (OMNIPAQUE) 350 MG/ML injection 75 mL (75 mLs Intravenous Contrast Given 03/01/22 0805)  cloNIDine (CATAPRES) tablet 0.3 mg (0.3 mg Oral Given 03/01/22 0837)  carvedilol (COREG) tablet 25 mg (25 mg Oral Given 03/01/22 7564)    ED Course/ Medical Decision Making/ A&P                           Medical Decision Making Amount and/or  Complexity of Data Reviewed Labs: ordered. Radiology: ordered.  Risk Prescription drug management.   [name] Keymon Mcelroy. is here as a code stroke with aphasia, left-sided  weakness.  Overall effort dependent exam.  Normal vitals.  No fever.  Dr. Leonel Ramsay with neurology at the bedside upon patient arrival to the ED.  Head CT shows no head bleed.  This was per my review and interpretation.  Will pursue MRI to further evaluate stroke process.  Differential could be metabolic process, seems less likely to be infectious process or cardiac process or pulmonary process.  Not sure if he just tired from recent hospital stay or having some metabolic effects from recent surgery and anesthesia.  He has really no specific complaints.  He thinks that maybe he is having trouble speaking but he is able to answer questions appropriately.  Overall neurology workup is unremarkable.  MRI with no acute process.  No stroke.  Lab work otherwise unremarkable including no significant anemia.  Creatinine at baseline.  COVID and flu test are negative.  Given home blood pressure medications.  Overall suspect symptoms are secondary to recent hospital admission.  There does not appear to be any obvious infectious process.  Blood pressure improved after home medications 150/55.   This chart was dictated using voice recognition software.  Despite best efforts to proofread,  errors can occur which can change the documentation meaning.         Final Clinical Impression(s) / ED Diagnoses Final diagnoses:  Weakness    Rx / DC Orders ED Discharge Orders     None         Lennice Sites, DO 03/01/22 1237

## 2022-03-02 ENCOUNTER — Encounter: Payer: Medicare HMO | Admitting: Podiatry

## 2022-03-03 ENCOUNTER — Telehealth: Payer: Self-pay | Admitting: *Deleted

## 2022-03-03 ENCOUNTER — Ambulatory Visit: Payer: Medicare HMO

## 2022-03-03 LAB — CULTURE, BLOOD (ROUTINE X 2)
Culture: NO GROWTH
Special Requests: ADEQUATE

## 2022-03-03 LAB — SURGICAL PATHOLOGY

## 2022-03-03 NOTE — Telephone Encounter (Signed)
LVM pt did not need phone appt for pre-op clearance due to pt's surgery is not till May and pt has upcoming appt with JV.  JV can address at ov.

## 2022-03-03 NOTE — Progress Notes (Unsigned)
Virtual Visit via Telephone Note   Because of [name] Maxfield Jr.'s co-morbid illnesses, he is at least at moderate risk for complications without adequate follow up.  This format is felt to be most appropriate for this patient at this time.  The patient did not have access to video technology/had technical difficulties with video requiring transitioning to audio format only (telephone).  All issues noted in this document were discussed and addressed.  No physical exam could be performed with this format.  Please refer to the patient's chart for his consent to telehealth for [facility].  Evaluation Performed:  Preoperative cardiovascular risk assessment _____________   Date:  03/03/2022   Patient ID:  [name] Cooper., DOB [birth year], MRN [id number] Patient Location:  Home Provider location:   Office  Primary Care Provider:  [name], MD Primary Cardiologist:  Larae Grooms, MD  Chief Complaint / Patient Profile   52 y.o. y/o male with a h/o *** who is pending *** and presents today for telephonic preoperative cardiovascular risk assessment.  History of Present Illness    [name] Germaine Shenker. is a 52 y.o. male who presents via audio/video conferencing for a telehealth visit today.  Pt was last seen in cardiology clinic on *** by ***.  At that time [name] [name]. was doing well ***.  The patient is now pending procedure as outlined above. Since his last visit, he ***  Past Medical History    Past Medical History:  Diagnosis Date   Anxiety    a.) on BZO (clonazepam) PRN   Aortic dilatation ([place]) 11/25/2020   a.) TTE 11/25/2020: Ao root measured 38 mm. b.) TTE 04/22/2021: Ao root measured 44 mm; c. 11/2021 Echo: Ao root 21mm.   Atrial fibrillation ([place])    a.) CHA2DS2-VASc = 6 (CHF, HTN, CVA x2, prior MI, T2DM). b.) rate/rhythm maintained on oral amiodarone + carvedilol; chronically anticoagulated using dose reduced apixaban + clopidogrel   BPH  (benign prostatic hyperplasia)    Cardiac arrest ([place]) 07/26/2021   a.) in setting of hyperkalemia, NSTEMI, and seizure following missed HD; pulseless and apneic --> required 1 round of CPR prior to ROSC   Cellulitis    Cerebral hemorrhage ([place])    a. 10/2021 interventricular hemorrhage in setting of HTN emergency and DOAC/APT-->eliquis/plavix d/c'd.   Chronic diarrhea    Chronic heart failure with preserved ejection fraction (HFpEF) ([place]) 07/19/2017   a. 06/2017 Echo: EF 75%; b. 11/2020 Echo: EF 55-60%; c. 04/2021 Echo: EF 55-60%; d. 11/2021 Echo: EF 65-70%, GrII DD, nl RV fxn, sev dil LA, large circumferential pericardial eff w/o tamponade, triv MR, AoV sclerosis. Ao root 80mm.   Chronic pain of both ankles    Coronary artery disease 02/26/2020   a. 02/2020 Ant STEMI/PCI: LM nl, LAD 47m (3.5x30 Resolute Onyx), RI nl, LCX min irregs, RCA min irregs. EF 65%.   Diabetes mellitus, type II ([place])    Diabetic neuropathy ([place])    Esophagitis    ESRD (end stage renal disease) on dialysis ([place])    a.) [facility]; T-Th-Sat   Frequent falls    Gait instability    Gastritis    Gastroparesis    GERD (gastroesophageal reflux disease)    H/O enucleation of left eyeball    Heart palpitations    History of kidney stones    HLD (hyperlipidemia)    Hypertension    Insomnia    a.) uses melatonin PRN   Nausea and vomiting in adult  recurrent   NSTEMI (non-ST elevated myocardial infarction) ([place]) 07/26/2021   a.) hyperkalemic from missed HD; seizures; decompensated to cardiac arrest and required CPR x 1 round prior to ROSC.   Osteomyelitis (HCC)    Pericardial effusion    a. 11/2021 Echo: EF 65-70%, GrII DD, nl RV fxn, sev dil LA, large circumferential pericardial eff w/o tamponade, triv MR, AoV sclerosis. Ao root 16mm.   Seizure ([place])    a.) last 07/26/2021 in setting of missed HD --> hyperkalemic at 6.5 --> pulseless/apneic and required CPR; discharged home on levetiracetam.   ST elevation myocardial  infarction (STEMI) of anterior wall ([place]) 02/26/2020   a.) LHC/PCI 02/26/2020 --> EF 65%; LVEDP 11 mmHg; 90% mLAD (3.5 x 20 mm Resolute Onyx DES x 1)   Stroke ([place]) 04/2021   a.) documented as x 3 between 04/2021 and 06/2021   Past Surgical History:  Procedure Laterality Date   A/V FISTULAGRAM Left 08/31/2021   Procedure: A/V Fistulagram;  Surgeon: Algernon Huxley, MD;  Location: [place] [facility];  Service: Cardiovascular;  Laterality: Left;   A/V FISTULAGRAM Left 01/25/2022   Procedure: A/V Fistulagram;  Surgeon: Algernon Huxley, MD;  Location: [place] [facility];  Service: Cardiovascular;  Laterality: Left;   AMPUTATION Left 03/16/2016   Procedure: AMPUTATION DIGIT LEFT HALLUX;  Surgeon: Trula Slade, DPM;  Location: [place];  Service: Podiatry;  Laterality: Left;  can start around 5    AMPUTATION Right 02/09/2021   Procedure: AMPUTATION DIGIT;  Surgeon: Sherilyn Cooter, MD;  Location: [place];  Service: Orthopedics;  Laterality: Right;   AMPUTATION TOE Right 02/28/2022   Procedure: AMPUTATION TOE;  Surgeon: Lorenda Peck, DPM;  Location: [name];  Service: Podiatry;  Laterality: Right;  right fifth toe   ARTHROSCOPIC REPAIR ACL Left    AV FISTULA PLACEMENT Right 05/31/2019   Procedure: Brachiocephalic AV fistula creation;  Surgeon: Algernon Huxley, MD;  Location: [name];  Service: Vascular;  Laterality: Right;   AV FISTULA PLACEMENT Left 08/13/2021   Procedure: ARTERIOVENOUS (AV) FISTULA CREATION (BRACHIALCEPHALIC);  Surgeon: Algernon Huxley, MD;  Location: [name];  Service: Vascular;  Laterality: Left;   COLONOSCOPY WITH PROPOFOL N/A 10/28/2015   Procedure: COLONOSCOPY WITH PROPOFOL;  Surgeon: Lollie Sails, MD;  Location: [facility];  Service: Endoscopy;  Laterality: N/A;   COLONOSCOPY WITH PROPOFOL N/A 10/29/2015   Procedure: COLONOSCOPY WITH PROPOFOL;  Surgeon: Lollie Sails, MD;  Location: [facility] ENDOSCOPY;  Service: Endoscopy;  Laterality: N/A;   CORONARY/GRAFT ACUTE MI  REVASCULARIZATION N/A 02/26/2020   Procedure: Coronary/Graft Acute MI Revascularization;  Surgeon: Jettie Booze, MD;  Location: [place] [facility];  Service: Cardiovascular;  Laterality: N/A;   DIALYSIS/PERMA CATHETER INSERTION Right 04/26/2019   Perm Cath    DIALYSIS/PERMA CATHETER INSERTION N/A 04/26/2019   Procedure: DIALYSIS/PERMA CATHETER INSERTION;  Surgeon: Algernon Huxley, MD;  Location: [place] [facility];  Service: Cardiovascular;  Laterality: N/A;   DIALYSIS/PERMA CATHETER INSERTION N/A 05/25/2021   Procedure: DIALYSIS/PERMA CATHETER INSERTION;  Surgeon: Algernon Huxley, MD;  Location: [place] [facility];  Service: Cardiovascular;  Laterality: N/A;   DIALYSIS/PERMA CATHETER INSERTION N/A 08/31/2021   Procedure: DIALYSIS/PERMA CATHETER INSERTION;  Surgeon: Algernon Huxley, MD;  Location: [place] [facility];  Service: Cardiovascular;  Laterality: N/A;   DIALYSIS/PERMA CATHETER REMOVAL N/A 08/15/2019   Procedure: DIALYSIS/PERMA CATHETER REMOVAL;  Surgeon: Katha Cabal, MD;  Location: [place] [facility];  Service: Cardiovascular;  Laterality: N/A;  EMBOLIZATION Right 02/06/2021   Procedure: EMBOLIZATION;  Surgeon: Algernon Huxley, MD;  Location: [place] [facility];  Service: Cardiovascular;  Laterality: Right;  Right Upper Extremity Dialysis Access, Permcath Placement   ESOPHAGOGASTRODUODENOSCOPY (EGD) WITH PROPOFOL N/A 12/27/2017   Procedure: ESOPHAGOGASTRODUODENOSCOPY (EGD) WITH PROPOFOL;  Surgeon: [place], Benay Pike, MD;  Location: [name] ENDOSCOPY;  Service: Gastroenterology;  Laterality: N/A;   EYE SURGERY     INTRAVASCULAR ULTRASOUND/IVUS N/A 02/26/2020   Procedure: Intravascular Ultrasound/IVUS;  Surgeon: Jettie Booze, MD;  Location: [place] [facility];  Service: Cardiovascular;  Laterality: N/A;   LEFT HEART CATH AND CORONARY ANGIOGRAPHY N/A 02/26/2020   Procedure: LEFT HEART CATH AND CORONARY ANGIOGRAPHY;  Surgeon: Jettie Booze, MD;  Location: [place]  [facility];  Service: Cardiovascular;  Laterality: N/A;   PROSTATE SURGERY  2016   TONSILECTOMY/ADENOIDECTOMY WITH MYRINGOTOMY     TONSILLECTOMY     UPPER EXTREMITY ANGIOGRAPHY Right 11/26/2020   Procedure: Upper Extremity Angiography;  Surgeon: Algernon Huxley, MD;  Location: [place] [facility];  Service: Cardiovascular;  Laterality: Right;   UPPER EXTREMITY ANGIOGRAPHY Right 02/05/2021   Procedure: UPPER EXTREMITY ANGIOGRAPHY;  Surgeon: Algernon Huxley, MD;  Location: [place] [facility];  Service: Cardiovascular;  Laterality: Right;    Allergies  Allergies  Allergen Reactions   Promethazine Diarrhea and Other (See Comments)    Muscle cramps   Promethazine Hcl Other (See Comments)    Cramping all over  Other reaction(s): cramp all over   Other     Other reaction(s): Unknown Other reaction(s): Unknown   Malt Other (See Comments)    Cramping    Home Medications    Prior to Admission medications   Medication Sig Start Date End Date Taking? Authorizing Provider  acarbose (PRECOSE) 50 MG tablet Take 50 mg by mouth 3 (three) times daily. 10/20/21   [provider]  acetaminophen (TYLENOL) 325 MG tablet Take 1-2 tablets (325-650 mg total) by mouth every 4 (four) hours as needed for mild pain. 05/04/21   Love, Ivan Anchors, PA-C  amiodarone (PACERONE) 200 MG tablet Take 1 tablet (200 mg total) by mouth every morning. 08/10/21 08/11/22  Swinyer, Lanice Schwab, NP  amLODipine (NORVASC) 10 MG tablet Take 1 tablet (10 mg total) by mouth daily. 02/28/22   Jennye Boroughs, MD  atorvastatin (LIPITOR) 80 MG tablet Take 1 tablet (80 mg total) by mouth daily. 05/04/21 04/29/22  Love, Ivan Anchors, PA-C  calcium acetate (PHOSLO) 667 MG capsule Take 2 capsules (1,334 mg total) by mouth 3 (three) times daily with meals. 05/04/21   Love, Ivan Anchors, PA-C  carvedilol (COREG) 25 MG tablet Take 25 mg by mouth 2 (two) times daily. 06/16/21   [provider]  cloNIDine (CATAPRES) 0.3 MG tablet Take 1 tablet (0.3 mg  total) by mouth every 8 (eight) hours. Patient taking differently: Take 0.3 mg by mouth every 8 (eight) hours. Only if BP over 200 12/03/21   Bonnielee Haff, MD  dorzolamide-timolol (COSOPT) 22.3-6.8 MG/ML ophthalmic solution Place 1 drop into the right eye 2 (two) times daily.    [provider]  irbesartan (AVAPRO) 300 MG tablet Take 1 tablet (300 mg total) by mouth daily. 12/03/21   Bonnielee Haff, MD  levETIRAcetam (KEPPRA) 250 MG tablet Take 1 tablet (250 mg total) by mouth Every Tuesday,Thursday,and Saturday with dialysis. 07/30/21   Nita Sells, MD  levETIRAcetam (KEPPRA) 750 MG tablet Take 1 tablet (750 mg total) by mouth 2 (two) times daily.  Patient taking differently: Take 750 mg by mouth 2 (two) times daily. Twice daily Only on dialysis days and once on all other days 12/05/21   Bonnielee Haff, MD  lidocaine-prilocaine (EMLA) cream Apply topically. 12/24/21   [provider]  omeprazole (PRILOSEC) 40 MG capsule Take 40 mg by mouth as needed.    [provider]    Physical Exam    Vital Signs:  [name] [name]. does not have vital signs available for review today.***  Given telephonic nature of communication, physical exam is limited. AAOx3. NAD. Normal affect.  Speech and respirations are unlabored.  Accessory Clinical Findings    None  Assessment & Plan    1.  Preoperative Cardiovascular Risk Assessment:  The patient was advised that if he develops new symptoms prior to surgery to contact our office to arrange for a follow-up visit, and he verbalized understanding.  (Reminder: Include SBE prophylaxis/Antiplatelet/Anticoag Instructions***)  A copy of this note will be routed to requesting surgeon.  Time:   Today, I have spent *** minutes with the patient with telehealth technology discussing medical history, symptoms, and management plan.     Elgie Collard, PA-C  03/03/2022, 7:41 AM

## 2022-03-04 ENCOUNTER — Encounter (HOSPITAL_COMMUNITY): Payer: Self-pay | Admitting: *Deleted

## 2022-03-04 ENCOUNTER — Other Ambulatory Visit: Payer: Self-pay

## 2022-03-04 ENCOUNTER — Emergency Department (HOSPITAL_COMMUNITY): Payer: Medicare HMO

## 2022-03-04 ENCOUNTER — Emergency Department (HOSPITAL_COMMUNITY)
Admission: EM | Admit: 2022-03-04 | Discharge: 2022-03-04 | Disposition: A | Discharge status: Home / Self Care | Payer: Medicare HMO | Attending: Student | Admitting: Student

## 2022-03-04 DIAGNOSIS — E1122 Type 2 diabetes mellitus with diabetic chronic kidney disease: Secondary | ICD-10-CM | POA: Diagnosis not present

## 2022-03-04 DIAGNOSIS — I129 Hypertensive chronic kidney disease with stage 1 through stage 4 chronic kidney disease, or unspecified chronic kidney disease: Secondary | ICD-10-CM | POA: Diagnosis not present

## 2022-03-04 DIAGNOSIS — W01198A Fall on same level from slipping, tripping and stumbling with subsequent striking against other object, initial encounter: Secondary | ICD-10-CM | POA: Diagnosis not present

## 2022-03-04 DIAGNOSIS — W19XXXA Unspecified fall, initial encounter: Secondary | ICD-10-CM

## 2022-03-04 DIAGNOSIS — N189 Chronic kidney disease, unspecified: Secondary | ICD-10-CM | POA: Diagnosis not present

## 2022-03-04 DIAGNOSIS — R519 Headache, unspecified: Secondary | ICD-10-CM | POA: Diagnosis present

## 2022-03-04 DIAGNOSIS — M542 Cervicalgia: Secondary | ICD-10-CM | POA: Insufficient documentation

## 2022-03-04 DIAGNOSIS — Z8673 Personal history of transient ischemic attack (TIA), and cerebral infarction without residual deficits: Secondary | ICD-10-CM | POA: Insufficient documentation

## 2022-03-04 LAB — CULTURE, BLOOD (ROUTINE X 2): Culture: NO GROWTH

## 2022-03-04 NOTE — ED Notes (Signed)
Patient transported to CT 

## 2022-03-04 NOTE — Discharge Instructions (Signed)
You were seen in the emergency department today for a fall.  The imaging of your head and neck are both normal.  Please return to the emergency department for confusion or sudden dizziness or increasing pain.  Please follow-up with your primary care provider.  You may use Tylenol as needed for pain relief.

## 2022-03-04 NOTE — ED Triage Notes (Addendum)
Pt with fall while sitting down to eat, chair tilted causing pt to fall, hitting his head on the wall and back.  C/o head and back pain. Denies LOC.  EMS placed pt in c-collar PTA. Denies any blood thinners. Pt is a hemodialysis pt and had full treatment this morning.

## 2022-03-04 NOTE — ED Provider Notes (Signed)
[facility] EMERGENCY DEPARTMENT Provider Note   CSN: [id number] Arrival date & time: 03/04/22  1150     History  Chief Complaint  Patient presents with   Fall    [name]. is a 52 y.o. male. With past medical history of NSTEMI, stroke, type 2 diabetes, HTN, CKD on HD who presents to the emergency department with fall.  States that he was sitting down for lunch when he fell.  He states that he was sitting up on a higher chair like a barstool when the chair tilted away from him and he fell backwards striking his head on the wall and then the ground.  He denies loss of consciousness.  He is not anticoagulated.  He does complain of pain to the head and neck.  He denies having any syncope, vomiting, changes to his vision since the event.  He denies any prodromal symptoms to his fall such as lightheadedness or dizziness, chest pain, palpitations or shortness of breath.   Fall Associated symptoms include headaches.       Home Medications Prior to Admission medications   Medication Sig Start Date End Date Taking? Authorizing Provider  acarbose (PRECOSE) 50 MG tablet Take 50 mg by mouth 3 (three) times daily. 10/20/21   [provider]  acetaminophen (TYLENOL) 325 MG tablet Take 1-2 tablets (325-650 mg total) by mouth every 4 (four) hours as needed for mild pain. 05/04/21   Love, Ivan Anchors, PA-C  amiodarone (PACERONE) 200 MG tablet Take 1 tablet (200 mg total) by mouth every morning. 08/10/21 08/11/22  Swinyer, Lanice Schwab, NP  amLODipine (NORVASC) 10 MG tablet Take 1 tablet (10 mg total) by mouth daily. 02/28/22   Jennye Boroughs, MD  atorvastatin (LIPITOR) 80 MG tablet Take 1 tablet (80 mg total) by mouth daily. 05/04/21 04/29/22  Love, Ivan Anchors, PA-C  calcium acetate (PHOSLO) 667 MG capsule Take 2 capsules (1,334 mg total) by mouth 3 (three) times daily with meals. 05/04/21   Love, Ivan Anchors, PA-C  carvedilol (COREG) 25 MG tablet Take 25 mg by mouth 2 (two) times daily. 06/16/21    [provider]  cloNIDine (CATAPRES) 0.3 MG tablet Take 1 tablet (0.3 mg total) by mouth every 8 (eight) hours. Patient taking differently: Take 0.3 mg by mouth every 8 (eight) hours. Only if BP over 200 12/03/21   Bonnielee Haff, MD  dorzolamide-timolol (COSOPT) 22.3-6.8 MG/ML ophthalmic solution Place 1 drop into the right eye 2 (two) times daily.    [provider]  irbesartan (AVAPRO) 300 MG tablet Take 1 tablet (300 mg total) by mouth daily. 12/03/21   Bonnielee Haff, MD  levETIRAcetam (KEPPRA) 250 MG tablet Take 1 tablet (250 mg total) by mouth Every Tuesday,Thursday,and Saturday with dialysis. 07/30/21   Nita Sells, MD  levETIRAcetam (KEPPRA) 750 MG tablet Take 1 tablet (750 mg total) by mouth 2 (two) times daily. Patient taking differently: Take 750 mg by mouth 2 (two) times daily. Twice daily Only on dialysis days and once on all other days 12/05/21   Bonnielee Haff, MD  lidocaine-prilocaine (EMLA) cream Apply topically. 12/24/21   [provider]  omeprazole (PRILOSEC) 40 MG capsule Take 40 mg by mouth as needed.    [provider]      Allergies    Promethazine, Promethazine hcl, Other, and Malt    Review of Systems   Review of Systems  Musculoskeletal:  Positive for neck pain.  Neurological:  Positive for headaches.  All other systems  reviewed and are negative.   Physical Exam Updated Vital Signs BP (!) 132/104 (BP Location: Right Arm)   Pulse (!) 56   Temp 97.8 F (36.6 C) (Oral)   Resp 16   Ht 6\' 3"  (1.905 m)   Wt 85.3 kg   SpO2 96%   BMI 23.50 kg/m  Physical Exam Vitals and nursing note reviewed.  Constitutional:      General: He is not in acute distress.    Appearance: Normal appearance. He is not ill-appearing or toxic-appearing.  HENT:     Head: Normocephalic and atraumatic.  Eyes:     General: No scleral icterus.    Extraocular Movements: Extraocular movements intact.     Pupils: Pupils are equal, round, and  reactive to light.  Cardiovascular:     Pulses: Normal pulses.     Heart sounds: No murmur heard. Pulmonary:     Effort: Pulmonary effort is normal. No respiratory distress.  Musculoskeletal:        General: Normal range of motion.     Cervical back: Neck supple. Tenderness present.  Skin:    General: Skin is warm and dry.     Capillary Refill: Capillary refill takes less than 2 seconds.     Findings: No rash.  Neurological:     General: No focal deficit present.     Mental Status: He is alert and oriented to person, place, and time. Mental status is at baseline.     Motor: No weakness.  Psychiatric:        Mood and Affect: Mood normal.        Behavior: Behavior normal.        Thought Content: Thought content normal.        Judgment: Judgment normal.     ED Results / Procedures / Treatments   Labs (all labs ordered are listed, but only abnormal results are displayed) Labs Reviewed - No data to display  EKG None  Radiology CT Head Wo Contrast  Result Date: 03/04/2022 CLINICAL DATA:  [place]. Hit head. EXAM: CT HEAD WITHOUT CONTRAST CT CERVICAL SPINE WITHOUT CONTRAST TECHNIQUE: Multidetector CT imaging of the head and cervical spine was performed following the standard protocol without intravenous contrast. Multiplanar CT image reconstructions of the cervical spine were also generated. RADIATION DOSE REDUCTION: This exam was performed according to the departmental dose-optimization program which includes automated exposure control, adjustment of the mA and/or kV according to patient size and/or use of iterative reconstruction technique. COMPARISON:  Head CT 03/01/2022 FINDINGS: CT HEAD FINDINGS Brain: Stable age advanced cerebral atrophy, ventriculomegaly and periventricular white matter disease. No extra-axial fluid collections are identified. No CT findings for acute hemispheric infarction or intracranial hemorrhage. No mass lesions. The brainstem and cerebellum are normal. Vascular:  Age advanced vascular calcifications but no aneurysm or hyperdense vessels. Skull: No skull fracture or bone lesions. Sinuses/Orbits: The paranasal sinuses and mastoid air cells are clear. The right globe is intact. Prosthetic left globe is noted. Other: No scalp lesions or scalp hematoma. Extensive scalp vascular calcifications. CT CERVICAL SPINE FINDINGS Alignment: Normal Skull base and vertebrae: No acute fracture. Soft tissues and spinal canal: No prevertebral fluid or swelling. No visible canal hematoma. Disc levels: Advanced degenerative disc disease at C3-4. No large disc protrusions, significant spinal or foraminal stenosis. Upper chest: The lung apices are grossly clear. Other: Extensive vascular calcifications. IMPRESSION: 1. Stable age advanced cerebral atrophy, ventriculomegaly and periventricular white matter disease. 2. No acute intracranial findings or skull fracture.  3. Normal alignment of the cervical spine fracture and no acute cervical spine fracture. Electronically Signed   By: Marijo Sanes M.D.   On: 03/04/2022 13:27   CT Cervical Spine Wo Contrast  Result Date: 03/04/2022 CLINICAL DATA:  [place]. Hit head. EXAM: CT HEAD WITHOUT CONTRAST CT CERVICAL SPINE WITHOUT CONTRAST TECHNIQUE: Multidetector CT imaging of the head and cervical spine was performed following the standard protocol without intravenous contrast. Multiplanar CT image reconstructions of the cervical spine were also generated. RADIATION DOSE REDUCTION: This exam was performed according to the departmental dose-optimization program which includes automated exposure control, adjustment of the mA and/or kV according to patient size and/or use of iterative reconstruction technique. COMPARISON:  Head CT 03/01/2022 FINDINGS: CT HEAD FINDINGS Brain: Stable age advanced cerebral atrophy, ventriculomegaly and periventricular white matter disease. No extra-axial fluid collections are identified. No CT findings for acute hemispheric  infarction or intracranial hemorrhage. No mass lesions. The brainstem and cerebellum are normal. Vascular: Age advanced vascular calcifications but no aneurysm or hyperdense vessels. Skull: No skull fracture or bone lesions. Sinuses/Orbits: The paranasal sinuses and mastoid air cells are clear. The right globe is intact. Prosthetic left globe is noted. Other: No scalp lesions or scalp hematoma. Extensive scalp vascular calcifications. CT CERVICAL SPINE FINDINGS Alignment: Normal Skull base and vertebrae: No acute fracture. Soft tissues and spinal canal: No prevertebral fluid or swelling. No visible canal hematoma. Disc levels: Advanced degenerative disc disease at C3-4. No large disc protrusions, significant spinal or foraminal stenosis. Upper chest: The lung apices are grossly clear. Other: Extensive vascular calcifications. IMPRESSION: 1. Stable age advanced cerebral atrophy, ventriculomegaly and periventricular white matter disease. 2. No acute intracranial findings or skull fracture. 3. Normal alignment of the cervical spine fracture and no acute cervical spine fracture. Electronically Signed   By: Marijo Sanes M.D.   On: 03/04/2022 13:27    Procedures Procedures   Medications Ordered in ED Medications - No data to display  ED Course/ Medical Decision Making/ A&P                           Medical Decision Making Amount and/or Complexity of Data Reviewed Radiology: ordered.  Initial Impression and Ddx 52 year old male who presents to the emergency department with mechanical fall.  He is overall well-appearing.  Arrives in a c-collar.  He does have some tenderness to palpation of the C-spine.  No tenderness to palpation of the T or L-spine.  He complains of pain to the back of his head.  There is no acute traumatic findings to the scalp.  Will image his head and C-spine. Patient PMH that increases complexity of ED encounter: NSTEMI, stroke, hypertension, chronic kidney disease  Interpretation  of Diagnostics I independent reviewed and interpreted the labs as followed: Not indicated  - I independently visualized the following imaging with scope of interpretation limited to determining acute life threatening conditions related to emergency care: CT head and C-spine, which revealed no acute findings  Patient Reassessment and Ultimate Disposition/Management Imaging without any acute findings. He has no extremity trauma or trauma to the thorax or abdomen or low back. C-collar was removed. No prodromal symptoms concerning for other etiology of his fall.  Story consistent with mechanical fall.  Can use Tylenol home for pain relief.  The patient has been appropriately medically screened and/or stabilized in the ED. I have low suspicion for any other emergent medical condition which would require further screening, evaluation  or treatment in the ED or require inpatient management. At time of discharge the patient is hemodynamically stable and in no acute distress. I have discussed work-up results and diagnosis with patient and answered all questions. Patient is agreeable with discharge plan. We discussed strict return precautions for returning to the emergency department and they verbalized understanding.     Patient management required discussion with the following services or consulting groups:  None  Complexity of Problems Addressed Acute complicated illness or Injury  Additional Data Reviewed and Analyzed Further history obtained from: Care Everywhere  Patient Encounter Risk Assessment None  Final Clinical Impression(s) / ED Diagnoses Final diagnoses:  Fall, initial encounter    Rx / DC Orders ED Discharge Orders     None         Mickie Hillier, PA-C 03/04/22 1352    Teressa Lower, MD 03/04/22 2156

## 2022-03-05 ENCOUNTER — Telehealth: Payer: Self-pay | Admitting: Interventional Cardiology

## 2022-03-05 LAB — AEROBIC/ANAEROBIC CULTURE W GRAM STAIN (SURGICAL/DEEP WOUND): Gram Stain: NONE SEEN

## 2022-03-05 NOTE — Telephone Encounter (Signed)
[name] medication issue:  1. Name of Medication: Nitroglycerin   2. How are you currently taking this medication (dosage and times per day)?   3. Are you having a reaction (difficulty breathing--STAT)?   4. What is your medication issue? Pt needs a refill on nitroglycerin, did not see on med list anymore but he would like it sent to pharmacy on [facility]

## 2022-03-05 NOTE — Telephone Encounter (Signed)
Called pt to inform him that  his medication nitroglycerin was D/C while pt was in the hospital and medication is no longer on pt's medication list. I advised the pt that if he has any other problems, questions or concerns, to give our office a call.

## 2022-03-09 ENCOUNTER — Telehealth (INDEPENDENT_AMBULATORY_CARE_PROVIDER_SITE_OTHER): Payer: Self-pay

## 2022-03-09 NOTE — Telephone Encounter (Addendum)
I was sent a referral from [facility] at [facility] for a permcath removal for the patient. Patient is scheduled with Dr. Lucky Cowboy on 03/15/22 for a permcath removal with a 1:00 pm [facility]. Pre-procedure instructions were discussed and will be faxed to [facility] at [facility].

## 2022-03-15 ENCOUNTER — Ambulatory Visit
Admission: RE | Admit: 2022-03-15 | Discharge: 2022-03-15 | Disposition: A | Discharge status: Home / Self Care | Payer: Medicare HMO | Source: Ambulatory Visit | Attending: Vascular Surgery | Admitting: Vascular Surgery

## 2022-03-15 ENCOUNTER — Encounter
Admission: RE | Disposition: A | Discharge status: Home / Self Care | Payer: Self-pay | Source: Ambulatory Visit | Attending: Vascular Surgery

## 2022-03-15 DIAGNOSIS — Z452 Encounter for adjustment and management of vascular access device: Secondary | ICD-10-CM | POA: Diagnosis not present

## 2022-03-15 DIAGNOSIS — Z992 Dependence on renal dialysis: Secondary | ICD-10-CM | POA: Insufficient documentation

## 2022-03-15 DIAGNOSIS — N186 End stage renal disease: Secondary | ICD-10-CM | POA: Insufficient documentation

## 2022-03-15 DIAGNOSIS — E1165 Type 2 diabetes mellitus with hyperglycemia: Secondary | ICD-10-CM | POA: Diagnosis not present

## 2022-03-15 DIAGNOSIS — I132 Hypertensive heart and chronic kidney disease with heart failure and with stage 5 chronic kidney disease, or end stage renal disease: Secondary | ICD-10-CM | POA: Diagnosis not present

## 2022-03-15 DIAGNOSIS — Z95828 Presence of other vascular implants and grafts: Secondary | ICD-10-CM

## 2022-03-15 DIAGNOSIS — I5022 Chronic systolic (congestive) heart failure: Secondary | ICD-10-CM | POA: Diagnosis not present

## 2022-03-15 DIAGNOSIS — E1122 Type 2 diabetes mellitus with diabetic chronic kidney disease: Secondary | ICD-10-CM | POA: Insufficient documentation

## 2022-03-15 HISTORY — PX: DIALYSIS/PERMA CATHETER REMOVAL: CATH118289

## 2022-03-15 SURGERY — DIALYSIS/PERMA CATHETER REMOVAL
Anesthesia: LOCAL

## 2022-03-15 MED ORDER — LIDOCAINE-EPINEPHRINE (PF) 1 %-1:200000 IJ SOLN
INTRAMUSCULAR | Status: DC | PRN
  Administered 2022-03-15: 15 mL via INTRADERMAL

## 2022-03-15 SURGICAL SUPPLY — 3 items
CHLORAPREP W/TINT 26 (MISCELLANEOUS) IMPLANT
FORCEPS HALSTEAD CVD 5IN STRL (INSTRUMENTS) IMPLANT
TRAY LACERAT/PLASTIC (MISCELLANEOUS) IMPLANT

## 2022-03-15 NOTE — Op Note (Signed)
Operative Note     Preoperative diagnosis:   1. ESRD with functional permanent access  Postoperative diagnosis:  1. ESRD with functional permanent access  Procedure:  Removal of right jugular Permcath  Surgeon:  [name], MD  Assistant: Annalee Genta, NP  Anesthesia:  Local  EBL:  Minimal  Indication for the Procedure:  The patient has a functional permanent dialysis access and no longer needs their permcath.  This can be removed.  Risks and benefits are discussed and informed consent is obtained.  Description of the Procedure:  The patient's right neck, chest and existing catheter were sterilely prepped and draped. The area around the catheter was anesthetized copiously with 1% lidocaine. The catheter was dissected out with curved hemostats until the cuff was freed from the surrounding fibrous sheath. The fiber sheath was transected, and the catheter was then removed in its entirety using gentle traction. Pressure was held and sterile dressings were placed. The patient tolerated the procedure well and was taken to the recovery room in stable condition.     [name]  03/15/2022, 3:16 PM This note was created with Dragon Medical transcription system. Any errors in dictation are purely unintentional.

## 2022-03-15 NOTE — Discharge Instructions (Signed)
Tunneled Catheter Removal, Care After Refer to this sheet in the next few weeks. These instructions provide you with information about caring for yourself after your procedure. Your health care provider may also give you more specific instructions. Your treatment has been planned according to current medical practices, but problems sometimes occur. Call your health care provider if you have any problems or questions after your procedure. What can I expect after the procedure? After the procedure, it is common to have: Some mild redness, swelling, and pain around your catheter site.   Follow these instructions at home: Incision care  Check your removal site  every day for signs of infection. Check for: More redness, swelling, or pain. More fluid or blood. Warmth. Pus or a bad smell. Remove your dressing in 48hrs leave open to air  Activity  Return to your normal activities as told by your health care provider. Ask your health care provider what activities are safe for you. Do not lift anything that is heavier than 10 lb (4.5 kg) for 3 days  You may shower tomorrow  Contact a health care provider if: You have more fluid or blood coming from your removal site You have more redness, swelling, or pain at your incisions or around the area where your catheter was removed Your removal site feel warm to the touch. You feel unusually weak. You feel nauseous.. Get help right away if You have swelling in your arm, shoulder, neck, or face. You develop chest pain. You have difficulty breathing. You feel dizzy or light-headed. You have pus or a bad smell coming from your removal site You have a fever. You develop bleeding from your removal site, and your bleeding does not stop. This information is not intended to replace advice given to you by your health care provider. Make sure you discuss any questions you have with your health care provider. Document Released: 01/26/2012 Document Revised:  10/12/2015 Document Reviewed: 11/04/2014 Elsevier Interactive Patient Education  2017 [employer] 

## 2022-03-15 NOTE — Interval H&P Note (Signed)
History and Physical Interval Note:  03/15/2022 3:14 PM  [name].  has presented today for surgery, with the diagnosis of Perma Cath Removal   End Stage Renal.  The various methods of treatment have been discussed with the patient and family. After consideration of risks, benefits and other options for treatment, the patient has consented to  Procedure(s): DIALYSIS/PERMA CATHETER REMOVAL (N/A) as a surgical intervention.  The patient's history has been reviewed, patient examined, no change in status, stable for surgery.  I have reviewed the patient's chart and labs.  Questions were answered to the patient's satisfaction.     [name]

## 2022-03-16 ENCOUNTER — Encounter: Payer: Self-pay | Admitting: Vascular Surgery

## 2022-03-22 ENCOUNTER — Ambulatory Visit (INDEPENDENT_AMBULATORY_CARE_PROVIDER_SITE_OTHER): Payer: Medicare HMO

## 2022-03-22 ENCOUNTER — Ambulatory Visit (INDEPENDENT_AMBULATORY_CARE_PROVIDER_SITE_OTHER): Payer: Medicare HMO | Admitting: Podiatry

## 2022-03-22 ENCOUNTER — Other Ambulatory Visit: Payer: Self-pay | Admitting: Podiatry

## 2022-03-22 DIAGNOSIS — M86071 Acute hematogenous osteomyelitis, right ankle and foot: Secondary | ICD-10-CM

## 2022-03-22 DIAGNOSIS — M79671 Pain in right foot: Secondary | ICD-10-CM

## 2022-03-22 DIAGNOSIS — M869 Osteomyelitis, unspecified: Secondary | ICD-10-CM

## 2022-03-22 DIAGNOSIS — Z9889 Other specified postprocedural states: Secondary | ICD-10-CM

## 2022-03-22 DIAGNOSIS — I739 Peripheral vascular disease, unspecified: Secondary | ICD-10-CM

## 2022-03-22 DIAGNOSIS — S98131A Complete traumatic amputation of one right lesser toe, initial encounter: Secondary | ICD-10-CM

## 2022-03-22 NOTE — Progress Notes (Signed)
  Subjective: Chief Complaint  Patient presents with   Post-op Follow-up    Right foot, 5th digit amputation     [name]. is a 52 y.o. is seen today in office s/p right fifth toe amputation preformed on 02/28/2022 with Dr. Blenda Mounts.  He has not had any drainage.  States he is doing well.  No fever or chills.  No pain.  He is walking in the boot.  Objective: General: No acute distress, AAOx3  DP/PT pulses decreased, CRT < 3 sec to all digits.  Right foot: Incision is well coapted without any evidence of dehiscence.  Sutures are intact.  There is no surrounding erythema, ascending cellulitis, fluctuance, crepitus, malodor, drainage/purulence. There is minimal edema around the surgical site. There is no significant pain along the surgical site.  No pain with calf compression, swelling, warmth, erythema.     Assessment and Plan:  Status post right fifth amputation, doing well with 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s   -Treatment options discussed including all alternatives, risks, and complications -X-rays obtained reviewed of the right foot.  Status post fifth application.  Significant vessel calcifications present.  No evidence of acute fracture. -Incision appears to be healing well.  I did not remove the sutures today.  Small moderate was applied followed by dressing.  Keep dressing clean, dry, intact but if needed he can apply a similar dressing. -Continue offloading and minimal weightbearing -Monitor for any clinical signs or symptoms of infection and DVT/PE and directed to call the office immediately should any occur or go to the ER. -Follow-up scheduled for suture movable or sooner if any problems arise. In the meantime, encouraged to call the office with any questions, concerns, change in symptoms.   Celesta Gentile, DPM

## 2022-03-24 ENCOUNTER — Ambulatory Visit: Payer: Medicare HMO | Attending: Interventional Cardiology | Admitting: Interventional Cardiology

## 2022-03-24 ENCOUNTER — Encounter: Payer: Self-pay | Admitting: Interventional Cardiology

## 2022-03-24 VITALS — BP 118/72 | HR 58 | Ht 75.0 in | Wt 192.6 lb

## 2022-03-24 DIAGNOSIS — I480 Paroxysmal atrial fibrillation: Secondary | ICD-10-CM

## 2022-03-24 DIAGNOSIS — N186 End stage renal disease: Secondary | ICD-10-CM | POA: Diagnosis not present

## 2022-03-24 DIAGNOSIS — I10 Essential (primary) hypertension: Secondary | ICD-10-CM | POA: Diagnosis not present

## 2022-03-24 DIAGNOSIS — I2510 Atherosclerotic heart disease of native coronary artery without angina pectoris: Secondary | ICD-10-CM

## 2022-03-24 DIAGNOSIS — E785 Hyperlipidemia, unspecified: Secondary | ICD-10-CM

## 2022-03-24 DIAGNOSIS — I3139 Other pericardial effusion (noninflammatory): Secondary | ICD-10-CM

## 2022-03-24 MED ORDER — NITROGLYCERIN 0.4 MG SL SUBL: 0.4000 mg | SUBLINGUAL_TABLET | SUBLINGUAL | 6 refills | Status: DC | PRN

## 2022-03-24 NOTE — Patient Instructions (Addendum)
Medication Instructions:  Your physician recommends that you continue on your current medications as directed. Please refer to the Current Medication list given to you today.  *If you need a refill on your cardiac medications before your next appointment, please call your pharmacy*   Lab Work: none If you have labs (blood work) drawn today and your tests are completely normal, you will receive your results only by: [facility] (if you have MyChart) OR A paper copy in the mail If you have any lab test that is abnormal or we need to change your treatment, we will call you to review the results.   Testing/Procedures: none   Follow-Up: At [facility], you and your health needs are our priority.  As part of our continuing mission to provide you with exceptional heart care, we have created designated Provider Care Teams.  These Care Teams include your primary Cardiologist (physician) and Advanced Practice Providers (APPs -  Physician Assistants and Nurse Practitioners) who all work together to provide you with the care you need, when you need it.  We recommend signing up for the patient portal called "MyChart".  Sign up information is provided on this After Visit Summary.  MyChart is used to connect with patients for Virtual Visits (Telemedicine).  Patients are able to view lab/test results, encounter notes, upcoming appointments, etc.  Non-urgent messages can be sent to your provider as well.   To learn more about what you can do with MyChart, go to [email or web address].    Your next appointment:   10/01/22 at 8:40  Provider:   Larae Grooms, MD     Other Instructions

## 2022-03-24 NOTE — Progress Notes (Signed)
Cardiology Office Note   Date:  03/24/2022   ID:  [name]., DOB 10/22/1970, MRN [id number]  PCP:  [name], MD    No chief complaint on file.  CAD  Wt Readings from Last 3 Encounters:  03/24/22 192 lb 9.6 oz (87.4 kg)  03/04/22 188 lb (85.3 kg)  03/01/22 193 lb 2 oz (87.6 kg)       History of Present Illness: [name]. is a 52 y.o. male   has a hx of uncontrolled DM, ESRD on HD (waiting organ transplant), HTN, HLD, gastroparesis, and diastolic dysfunction who was seen by our team after transfer from [facility] with code STEMI.    He underwent an echo 06/2017 that showed an LVEF of 75%, grade 1 DD and mild MR. He has been recently followed at [facility] for organ transplant (nephrology). Prior to his admission, he was at HD when he developed a sudden episode of hypotension with SOB in 60s and tachycardia at 120s. Per reports, he had completed > 3 hours of dialysis.There was no improvement in BP despite 900cc of fluids at dialysis center. On EMS arrival, EKG showed STE in anterior leads with reciprocal change in inferior lateral leads therefore a code STEMI activated. He was given ASA and heparin bolus and was transfered to [facility] for further evaluation.    Left heart cath in Jan 2022 showed: "The left ventricular systolic function is normal. LV end diastolic pressure is normal. The left ventricular ejection fraction is greater than 65% by visual estimate. There is no aortic valve stenosis. Mid LAD lesion is 90% stenosed. A drug-eluting stent was successfully placed using a STENT RESOLUTE ONYX 3.5X30, postdilated to > 4 mm. Stent optimized with IVUS. Post intervention, there is a 0% residual stenosis.   I suspect his abnormal ECG and anterior injury pattern was more from demand ischemia in the setting of hypotension and AFib with RVR, with severe mid LAD lesion.     During the cath, he converted to NSR briefly and his pulse pressure and BP returned to normal without  any pressors. In addition, LVEDP only 11 mm Hg despite having 2.4 L NS administered on transport.  He may have been a little dehydrated after 3 L fluid removal after dialysis.    In regards to AFib.  Continue IV Amiodarone.  Will have to decide if he needs longterm anticoagulation along with antiplatelet therapy.  If so, would change Brilinta to clopidogrel.  Would have to check what options work in the setting of ESRD."   Changed to Plavix in 2/22 due to cost.    He had eye surgery in 3/22.  Wife was donating a kidney to him but this was postponed due to a health reasons with her.   Had a right pinky toe amputated.   Denies : Chest pain. Dizziness. Leg edema. Nitroglycerin use. Orthopnea. Palpitations. Paroxysmal nocturnal dyspnea. Shortness of breath. Syncope.    Eliquis and clopidogrel stopped in October 2023 due to intracranial hemorrhage.  Pericardial effusion also noted at that time.  This has been present for over a year  Has EGD scheduled in 5/24.  Walks regularly for exercise.  Went to carowinds and walked a lot without issues.   Past Medical History:  Diagnosis Date   Anxiety    a.) on BZO (clonazepam) PRN   Aortic dilatation ([place]) 11/25/2020   a.) TTE 11/25/2020: Ao root measured 38 mm. b.) TTE 04/22/2021: Ao root measured 44 mm; c. 11/2021  Echo: Ao root 69mm.   Atrial fibrillation ([place])    a.) CHA2DS2-VASc = 6 (CHF, HTN, CVA x2, prior MI, T2DM). b.) rate/rhythm maintained on oral amiodarone + carvedilol; chronically anticoagulated using dose reduced apixaban + clopidogrel   BPH (benign prostatic hyperplasia)    Cardiac arrest ([place]) 07/26/2021   a.) in setting of hyperkalemia, NSTEMI, and seizure following missed HD; pulseless and apneic --> required 1 round of CPR prior to ROSC   Cellulitis    Cerebral hemorrhage ([place])    a. 10/2021 interventricular hemorrhage in setting of HTN emergency and DOAC/APT-->eliquis/plavix d/c'd.   Chronic diarrhea    Chronic heart failure with  preserved ejection fraction (HFpEF) ([place]) 07/19/2017   a. 06/2017 Echo: EF 75%; b. 11/2020 Echo: EF 55-60%; c. 04/2021 Echo: EF 55-60%; d. 11/2021 Echo: EF 65-70%, GrII DD, nl RV fxn, sev dil LA, large circumferential pericardial eff w/o tamponade, triv MR, AoV sclerosis. Ao root 33mm.   Chronic pain of both ankles    Coronary artery disease 02/26/2020   a. 02/2020 Ant STEMI/PCI: LM nl, LAD 16m (3.5x30 Resolute Onyx), RI nl, LCX min irregs, RCA min irregs. EF 65%.   Diabetes mellitus, type II ([place])    Diabetic neuropathy ([place])    Esophagitis    ESRD (end stage renal disease) on dialysis ([place])    a.) [facility]; T-Th-Sat   Frequent falls    Gait instability    Gastritis    Gastroparesis    GERD (gastroesophageal reflux disease)    H/O enucleation of left eyeball    Heart palpitations    History of kidney stones    HLD (hyperlipidemia)    Hypertension    Insomnia    a.) uses melatonin PRN   Nausea and vomiting in adult    recurrent   NSTEMI (non-ST elevated myocardial infarction) ([place]) 07/26/2021   a.) hyperkalemic from missed HD; seizures; decompensated to cardiac arrest and required CPR x 1 round prior to ROSC.   Osteomyelitis (H[facility])    Pericardial effusion    a. 11/2021 Echo: EF 65-70%, GrII DD, nl RV fxn, sev dil LA, large circumferential pericardial eff w/o tamponade, triv MR, AoV sclerosis. Ao root 83mm.   Seizure ([place])    a.) last 07/26/2021 in setting of missed HD --> hyperkalemic at 6.5 --> pulseless/apneic and required CPR; discharged home on levetiracetam.   ST elevation myocardial infarction (STEMI) of anterior wall ([place]) 02/26/2020   a.) LHC/PCI 02/26/2020 --> EF 65%; LVEDP 11 mmHg; 90% mLAD (3.5 x 20 mm Resolute Onyx DES x 1)   Stroke ([place]) 04/2021   a.) documented as x 3 between 04/2021 and 06/2021    Past Surgical History:  Procedure Laterality Date   A/V FISTULAGRAM Left 08/31/2021   Procedure: A/V Fistulagram;  Surgeon: Algernon Huxley, MD;  Location: [place] [facility];   Service: Cardiovascular;  Laterality: Left;   A/V FISTULAGRAM Left 01/25/2022   Procedure: A/V Fistulagram;  Surgeon: Algernon Huxley, MD;  Location: [place] [facility];  Service: Cardiovascular;  Laterality: Left;   AMPUTATION Left 03/16/2016   Procedure: AMPUTATION DIGIT LEFT HALLUX;  Surgeon: Trula Slade, DPM;  Location: [place];  Service: Podiatry;  Laterality: Left;  can start around 5    AMPUTATION Right 02/09/2021   Procedure: AMPUTATION DIGIT;  Surgeon: Sherilyn Cooter, MD;  Location: [place];  Service: Orthopedics;  Laterality: Right;   AMPUTATION TOE Right 02/28/2022   Procedure: AMPUTATION TOE;  Surgeon: Lorenda Peck, DPM;  Location:  [name];  Service: Podiatry;  Laterality: Right;  right fifth toe   ARTHROSCOPIC REPAIR ACL Left    AV FISTULA PLACEMENT Right 05/31/2019   Procedure: Brachiocephalic AV fistula creation;  Surgeon: Algernon Huxley, MD;  Location: [name];  Service: Vascular;  Laterality: Right;   AV FISTULA PLACEMENT Left 08/13/2021   Procedure: ARTERIOVENOUS (AV) FISTULA CREATION (BRACHIALCEPHALIC);  Surgeon: Algernon Huxley, MD;  Location: [name];  Service: Vascular;  Laterality: Left;   COLONOSCOPY WITH PROPOFOL N/A 10/28/2015   Procedure: COLONOSCOPY WITH PROPOFOL;  Surgeon: Lollie Sails, MD;  Location: [facility];  Service: Endoscopy;  Laterality: N/A;   COLONOSCOPY WITH PROPOFOL N/A 10/29/2015   Procedure: COLONOSCOPY WITH PROPOFOL;  Surgeon: Lollie Sails, MD;  Location: [facility];  Service: Endoscopy;  Laterality: N/A;   CORONARY/GRAFT ACUTE MI REVASCULARIZATION N/A 02/26/2020   Procedure: Coronary/Graft Acute MI Revascularization;  Surgeon: Jettie Booze, MD;  Location: [place] [facility];  Service: Cardiovascular;  Laterality: N/A;   DIALYSIS/PERMA CATHETER INSERTION Right 04/26/2019   Perm Cath    DIALYSIS/PERMA CATHETER INSERTION N/A 04/26/2019   Procedure: DIALYSIS/PERMA CATHETER INSERTION;  Surgeon: Algernon Huxley, MD;  Location:  [place] [facility];  Service: Cardiovascular;  Laterality: N/A;   DIALYSIS/PERMA CATHETER INSERTION N/A 05/25/2021   Procedure: DIALYSIS/PERMA CATHETER INSERTION;  Surgeon: Algernon Huxley, MD;  Location: [place] [facility];  Service: Cardiovascular;  Laterality: N/A;   DIALYSIS/PERMA CATHETER INSERTION N/A 08/31/2021   Procedure: DIALYSIS/PERMA CATHETER INSERTION;  Surgeon: Algernon Huxley, MD;  Location: [place] [facility];  Service: Cardiovascular;  Laterality: N/A;   DIALYSIS/PERMA CATHETER REMOVAL N/A 08/15/2019   Procedure: DIALYSIS/PERMA CATHETER REMOVAL;  Surgeon: Katha Cabal, MD;  Location: [place] [facility];  Service: Cardiovascular;  Laterality: N/A;   DIALYSIS/PERMA CATHETER REMOVAL N/A 03/15/2022   Procedure: DIALYSIS/PERMA CATHETER REMOVAL;  Surgeon: Algernon Huxley, MD;  Location: [place] [facility];  Service: Cardiovascular;  Laterality: N/A;   EMBOLIZATION Right 02/06/2021   Procedure: EMBOLIZATION;  Surgeon: Algernon Huxley, MD;  Location: [place] [facility];  Service: Cardiovascular;  Laterality: Right;  Right Upper Extremity Dialysis Access, Permcath Placement   ESOPHAGOGASTRODUODENOSCOPY (EGD) WITH PROPOFOL N/A 12/27/2017   Procedure: ESOPHAGOGASTRODUODENOSCOPY (EGD) WITH PROPOFOL;  Surgeon: [place], Benay Pike, MD;  Location: [name];  Service: Gastroenterology;  Laterality: N/A;   EYE SURGERY     INTRAVASCULAR ULTRASOUND/IVUS N/A 02/26/2020   Procedure: Intravascular Ultrasound/IVUS;  Surgeon: Jettie Booze, MD;  Location: [place] [facility];  Service: Cardiovascular;  Laterality: N/A;   LEFT HEART CATH AND CORONARY ANGIOGRAPHY N/A 02/26/2020   Procedure: LEFT HEART CATH AND CORONARY ANGIOGRAPHY;  Surgeon: Jettie Booze, MD;  Location: [place] [facility];  Service: Cardiovascular;  Laterality: N/A;   PROSTATE SURGERY  2016   TONSILECTOMY/ADENOIDECTOMY WITH MYRINGOTOMY     TONSILLECTOMY     UPPER EXTREMITY ANGIOGRAPHY Right 11/26/2020   Procedure:  Upper Extremity Angiography;  Surgeon: Algernon Huxley, MD;  Location: [place] [facility];  Service: Cardiovascular;  Laterality: Right;   UPPER EXTREMITY ANGIOGRAPHY Right 02/05/2021   Procedure: UPPER EXTREMITY ANGIOGRAPHY;  Surgeon: Algernon Huxley, MD;  Location: [place] [facility];  Service: Cardiovascular;  Laterality: Right;     Current Outpatient Medications  Medication Sig Dispense Refill   acarbose (PRECOSE) 50 MG tablet Take 50 mg by mouth 3 (three) times daily.     acetaminophen (TYLENOL) 325 MG tablet Take 1-2 tablets (325-650 mg  total) by mouth every 4 (four) hours as needed for mild pain.     amiodarone (PACERONE) 200 MG tablet Take 1 tablet (200 mg total) by mouth every morning. 90 tablet 3   amLODipine (NORVASC) 10 MG tablet Take 1 tablet (10 mg total) by mouth daily. 30 tablet 2   atorvastatin (LIPITOR) 80 MG tablet Take 1 tablet (80 mg total) by mouth daily. 30 tablet 0   calcium acetate (PHOSLO) 667 MG capsule Take 2 capsules (1,334 mg total) by mouth 3 (three) times daily with meals. 180 capsule 0   carvedilol (COREG) 25 MG tablet Take 25 mg by mouth 2 (two) times daily.     cloNIDine (CATAPRES) 0.3 MG tablet Take 1 tablet (0.3 mg total) by mouth every 8 (eight) hours. (Patient taking differently: Take 0.3 mg by mouth every 8 (eight) hours. Only if BP over 200) 90 tablet 2   diphenoxylate-atropine (LOMOTIL) 2.5-0.025 MG tablet Take as needed for diarrhea. Can take up to 4 times daily.     dorzolamide-timolol (COSOPT) 22.3-6.8 MG/ML ophthalmic solution Place 1 drop into the right eye 2 (two) times daily.     irbesartan (AVAPRO) 300 MG tablet Take 1 tablet (300 mg total) by mouth daily. 30 tablet 2   levETIRAcetam (KEPPRA) 250 MG tablet Take 1 tablet (250 mg total) by mouth Every Tuesday,Thursday,and Saturday with dialysis. 15 tablet 1   levETIRAcetam (KEPPRA) 750 MG tablet Take 1 tablet (750 mg total) by mouth 2 (two) times daily. (Patient taking differently: Take 750 mg by  mouth 2 (two) times daily. Twice daily Only on dialysis days and once on all other days) 60 tablet 2   lidocaine-prilocaine (EMLA) cream Apply topically.     omeprazole (PRILOSEC) 40 MG capsule Take 40 mg by mouth as needed.     Wheat Dextrin (BENEFIBER) POWD 2 teaspoon in 6-8 ounces of water Orally twice a day for 30 days     No current facility-administered medications for this visit.    Allergies:   Promethazine, Promethazine hcl, and Other    Social History:  The patient  reports that he has never smoked. He has never used smokeless tobacco. He reports that he does not drink alcohol and does not use drugs.   Family History:  The patient's family history includes CAD in his father; Diabetes Mellitus II in his mother; Kidney failure in his mother; Schizophrenia in his mother; Stroke in his father.    ROS:  Please see the history of present illness.   Otherwise, review of systems are positive for foot infection, s/p toe anputation.   All other systems are reviewed and negative.    PHYSICAL EXAM: VS:  BP 118/72   Pulse (!) 58   Ht 6\' 3"  (1.905 m)   Wt 192 lb 9.6 oz (87.4 kg)   SpO2 98%   BMI 24.07 kg/m  , BMI Body mass index is 24.07 kg/m. GEN: Well nourished, well developed, in no acute distress HEENT: normal Neck: no JVD, carotid bruits, or masses Cardiac: RRR; no murmurs, rubs, or gallops,; bilateral pitting edema (missed a dialysis treatment) Respiratory:  clear to auscultation bilaterally, normal work of breathing GI: soft, nontender, nondistended, + BS MS: no deformity or atrophy, right foot in a boot Skin: warm and dry, no rash Neuro:  Strength and sensation are intact Psych: euthymic mood, full affect   EKG:   The ekg ordered 03/01/22 demonstrates NSR, no ST changes   Recent Labs: 04/22/2021: TSH 3.772 11/21/2021:  B Natriuretic Peptide 1,416.0 02/27/2022: Magnesium 1.9 03/01/2022: ALT 9; BUN 38; Creatinine, Ser 8.30; Hemoglobin 10.5; Platelets 122; Potassium 4.2; Sodium  138   Lipid Panel    Component Value Date/Time   CHOL 94 11/21/2021 1117   TRIG 124 11/26/2021 0327   HDL 39 (L) 11/21/2021 1117   CHOLHDL 2.4 11/21/2021 1117   VLDL 21 11/21/2021 1117   LDLCALC 34 11/21/2021 1117   LDLCALC 102 (H) 04/25/2019 1159     Other studies Reviewed: Additional studies/ records that were reviewed today with results demonstrating: LDL 34 in 9/23.   ASSESSMENT AND PLAN:  CAD/Old MI: No angina on medical therapy. Continue aggressive secondary prevention.  Intracranial hemorrhage in September 23 in the setting of hypertensive emergency.  Eliquis and clopidogrel were stopped.  Would check with neurology to see when it would be safe to restart either clopidogrel 75 mg a day or aspirin 81 mg daily. Pericardial effusion noted on prior echo.  No signs clinically of tamponade.  Heart sounds are normal and do not sound distant.  Follow-up study in January described the effusion is moderate. ESRD: TOlerating dialysis well.  PAF: No palpitations.  On low dose Amio.  HTN: rare increased BP at dialysis.  COntrolled for the most part.  Hyperlipidemia: Continue atorvastatin.  Preoperative cardiovascular: Has EGD in 5/24.  No further cardiovascular testing needed before this procedure.    Current medicines are reviewed at length with the patient today.  The patient concerns regarding his medicines were addressed.  The following changes have been made:  No change  Labs/ tests ordered today include:  No orders of the defined types were placed in this encounter.   Recommend 150 minutes/week of aerobic exercise Low fat, low carb, high fiber diet recommended  Disposition:   FU in 1 year   Signed, Larae Grooms, MD  03/24/2022 10:13 AM    [address], [place], [place]  [address] Phone: [phone]; Fax: [phone]

## 2022-03-24 NOTE — Addendum Note (Signed)
Addended by: Thompson Grayer on: 03/24/2022 05:53 PM   Modules accepted: Orders

## 2022-03-29 ENCOUNTER — Other Ambulatory Visit: Payer: Medicare HMO

## 2022-03-30 ENCOUNTER — Telehealth: Payer: Self-pay | Admitting: Podiatry

## 2022-03-30 ENCOUNTER — Ambulatory Visit (INDEPENDENT_AMBULATORY_CARE_PROVIDER_SITE_OTHER): Payer: Medicare HMO | Admitting: Podiatry

## 2022-03-30 DIAGNOSIS — Z91199 Patient's noncompliance with other medical treatment and regimen due to unspecified reason: Secondary | ICD-10-CM

## 2022-03-30 NOTE — Telephone Encounter (Signed)
Left message for pt to call to r/s his appt he missed on 2.5 for suture removal..

## 2022-03-30 NOTE — Progress Notes (Signed)
No show

## 2022-03-30 NOTE — Telephone Encounter (Signed)
Pt called and is scheduled to see Dr Blenda Mounts today at 1245.

## 2022-03-31 ENCOUNTER — Ambulatory Visit (INDEPENDENT_AMBULATORY_CARE_PROVIDER_SITE_OTHER): Payer: Medicare HMO

## 2022-03-31 VITALS — BP 144/90 | HR 69

## 2022-03-31 DIAGNOSIS — S98131A Complete traumatic amputation of one right lesser toe, initial encounter: Secondary | ICD-10-CM

## 2022-03-31 NOTE — Progress Notes (Signed)
Patient presents today for post op visit # 2, patient of [name].   POV # 2 DOS 02/28/22 Right foot 5th digit amputation    Sutures removed today without complication. Patient states he is doing well. Patient denies nausea, vomiting, fever and chills. No concerns voiced today by patient.    Reviewed icing and elevation. Patient will follow up with [name]  for POV# 3 in 3 weeks.

## 2022-04-06 ENCOUNTER — Emergency Department (HOSPITAL_COMMUNITY): Payer: Medicare HMO

## 2022-04-06 ENCOUNTER — Observation Stay (HOSPITAL_COMMUNITY)
Admission: EM | Admit: 2022-04-06 | Discharge: 2022-04-08 | Disposition: A | Discharge status: Home / Self Care | Payer: Medicare HMO | Attending: Family Medicine | Admitting: Family Medicine

## 2022-04-06 ENCOUNTER — Other Ambulatory Visit: Payer: Self-pay

## 2022-04-06 ENCOUNTER — Encounter (HOSPITAL_COMMUNITY): Payer: Self-pay

## 2022-04-06 DIAGNOSIS — R4182 Altered mental status, unspecified: Secondary | ICD-10-CM | POA: Diagnosis present

## 2022-04-06 DIAGNOSIS — I2510 Atherosclerotic heart disease of native coronary artery without angina pectoris: Secondary | ICD-10-CM | POA: Insufficient documentation

## 2022-04-06 DIAGNOSIS — Z1152 Encounter for screening for COVID-19: Secondary | ICD-10-CM | POA: Insufficient documentation

## 2022-04-06 DIAGNOSIS — Z87898 Personal history of other specified conditions: Secondary | ICD-10-CM

## 2022-04-06 DIAGNOSIS — E1165 Type 2 diabetes mellitus with hyperglycemia: Secondary | ICD-10-CM | POA: Insufficient documentation

## 2022-04-06 DIAGNOSIS — I5032 Chronic diastolic (congestive) heart failure: Secondary | ICD-10-CM | POA: Diagnosis not present

## 2022-04-06 DIAGNOSIS — R569 Unspecified convulsions: Secondary | ICD-10-CM | POA: Diagnosis not present

## 2022-04-06 DIAGNOSIS — R410 Disorientation, unspecified: Secondary | ICD-10-CM | POA: Diagnosis not present

## 2022-04-06 DIAGNOSIS — I132 Hypertensive heart and chronic kidney disease with heart failure and with stage 5 chronic kidney disease, or end stage renal disease: Secondary | ICD-10-CM | POA: Diagnosis not present

## 2022-04-06 DIAGNOSIS — E1169 Type 2 diabetes mellitus with other specified complication: Secondary | ICD-10-CM

## 2022-04-06 DIAGNOSIS — I160 Hypertensive urgency: Secondary | ICD-10-CM | POA: Diagnosis not present

## 2022-04-06 DIAGNOSIS — I480 Paroxysmal atrial fibrillation: Secondary | ICD-10-CM | POA: Diagnosis not present

## 2022-04-06 DIAGNOSIS — E875 Hyperkalemia: Secondary | ICD-10-CM

## 2022-04-06 DIAGNOSIS — E1140 Type 2 diabetes mellitus with diabetic neuropathy, unspecified: Secondary | ICD-10-CM | POA: Insufficient documentation

## 2022-04-06 DIAGNOSIS — Z79899 Other long term (current) drug therapy: Secondary | ICD-10-CM | POA: Diagnosis not present

## 2022-04-06 DIAGNOSIS — Z8673 Personal history of transient ischemic attack (TIA), and cerebral infarction without residual deficits: Secondary | ICD-10-CM | POA: Diagnosis not present

## 2022-04-06 DIAGNOSIS — Z992 Dependence on renal dialysis: Secondary | ICD-10-CM

## 2022-04-06 DIAGNOSIS — G9340 Encephalopathy, unspecified: Principal | ICD-10-CM

## 2022-04-06 DIAGNOSIS — Z794 Long term (current) use of insulin: Secondary | ICD-10-CM

## 2022-04-06 DIAGNOSIS — E1122 Type 2 diabetes mellitus with diabetic chronic kidney disease: Secondary | ICD-10-CM | POA: Insufficient documentation

## 2022-04-06 DIAGNOSIS — I159 Secondary hypertension, unspecified: Secondary | ICD-10-CM

## 2022-04-06 DIAGNOSIS — N186 End stage renal disease: Secondary | ICD-10-CM | POA: Diagnosis not present

## 2022-04-06 LAB — COMPREHENSIVE METABOLIC PANEL
ALT: 25 U/L (ref 0–44)
AST: 21 U/L (ref 15–41)
Albumin: 3.3 g/dL — ABNORMAL LOW (ref 3.5–5.0)
Alkaline Phosphatase: 96 U/L (ref 38–126)
Anion gap: 12 (ref 5–15)
BUN: 72 mg/dL — ABNORMAL HIGH (ref 6–20)
CO2: 17 mmol/L — ABNORMAL LOW (ref 22–32)
Calcium: 8.5 mg/dL — ABNORMAL LOW (ref 8.9–10.3)
Chloride: 114 mmol/L — ABNORMAL HIGH (ref 98–111)
Creatinine, Ser: 10.66 mg/dL — ABNORMAL HIGH (ref 0.61–1.24)
GFR, Estimated: 5 mL/min — ABNORMAL LOW (ref >60)
Glucose, Bld: 78 mg/dL (ref 70–99)
Potassium: 6.1 mmol/L — ABNORMAL HIGH (ref 3.5–5.1)
Sodium: 143 mmol/L (ref 135–145)
Total Bilirubin: 0.6 mg/dL (ref 0.3–1.2)
Total Protein: 6.2 g/dL — ABNORMAL LOW (ref 6.5–8.1)

## 2022-04-06 LAB — CBC
HCT: 33.3 % — ABNORMAL LOW (ref 39.0–52.0)
Hemoglobin: 10 g/dL — ABNORMAL LOW (ref 13.0–17.0)
MCH: 22 pg — ABNORMAL LOW (ref 26.0–34.0)
MCHC: 30 g/dL (ref 30.0–36.0)
MCV: 73.3 fL — ABNORMAL LOW (ref 80.0–100.0)
Platelets: 161 10*3/uL (ref 150–400)
RBC: 4.54 MIL/uL (ref 4.22–5.81)
RDW: 25.9 % — ABNORMAL HIGH (ref 11.5–15.5)
WBC: 6.4 10*3/uL (ref 4.0–10.5)
nRBC: 2.3 % — ABNORMAL HIGH (ref 0.0–0.2)

## 2022-04-06 LAB — RESP PANEL BY RT-PCR (RSV, FLU A&B, COVID)  RVPGX2
Influenza A by PCR: NEGATIVE
Influenza B by PCR: NEGATIVE
Resp Syncytial Virus by PCR: NEGATIVE
SARS Coronavirus 2 by RT PCR: NEGATIVE

## 2022-04-06 LAB — TROPONIN I (HIGH SENSITIVITY)
Troponin I (High Sensitivity): 29 ng/L — ABNORMAL HIGH (ref <18)
Troponin I (High Sensitivity): 29 ng/L — ABNORMAL HIGH (ref <18)

## 2022-04-06 LAB — GLUCOSE, CAPILLARY: Glucose-Capillary: 177 mg/dL — ABNORMAL HIGH (ref 70–99)

## 2022-04-06 LAB — CBG MONITORING, ED: Glucose-Capillary: 82 mg/dL (ref 70–99)

## 2022-04-06 LAB — HEPATITIS B SURFACE ANTIGEN: Hepatitis B Surface Ag: NONREACTIVE

## 2022-04-06 LAB — ETHANOL: Alcohol, Ethyl (B): <10 mg/dL (ref <10)

## 2022-04-06 MED ORDER — ALBUTEROL SULFATE (2.5 MG/3ML) 0.083% IN NEBU
10.0000 mg | INHALATION_SOLUTION | Freq: Once | RESPIRATORY_TRACT | Status: AC
  Administered 2022-04-06: 10 mg via RESPIRATORY_TRACT
  Filled 2022-04-06: qty 12

## 2022-04-06 MED ORDER — LIDOCAINE HCL (PF) 1 % IJ SOLN: 5.0000 mL | INTRAMUSCULAR | Status: DC | PRN

## 2022-04-06 MED ORDER — HYDRALAZINE HCL 20 MG/ML IJ SOLN: 10.0000 mg | INTRAMUSCULAR | Status: DC | PRN

## 2022-04-06 MED ORDER — CARVEDILOL 12.5 MG PO TABS
25.0000 mg | ORAL_TABLET | Freq: Two times a day (BID) | ORAL | Status: DC
  Administered 2022-04-06 – 2022-04-08 (×4): 25 mg via ORAL
  Filled 2022-04-06 (×4): qty 2

## 2022-04-06 MED ORDER — CALCIUM ACETATE (PHOS BINDER) 667 MG PO CAPS
1334.0000 mg | ORAL_CAPSULE | Freq: Three times a day (TID) | ORAL | Status: DC
  Administered 2022-04-07 – 2022-04-08 (×4): 1334 mg via ORAL
  Filled 2022-04-06 (×4): qty 2

## 2022-04-06 MED ORDER — ACETAMINOPHEN 325 MG PO TABS
650.0000 mg | ORAL_TABLET | Freq: Four times a day (QID) | ORAL | Status: DC | PRN
  Administered 2022-04-07: 650 mg via ORAL
  Filled 2022-04-06: qty 2

## 2022-04-06 MED ORDER — LEVETIRACETAM 500 MG PO TABS
750.0000 mg | ORAL_TABLET | ORAL | Status: DC
  Administered 2022-04-07: 750 mg via ORAL
  Filled 2022-04-06: qty 1

## 2022-04-06 MED ORDER — POLYETHYLENE GLYCOL 3350 17 G PO PACK
17.0000 g | PACK | Freq: Every day | ORAL | Status: DC | PRN
  Filled 2022-04-06: qty 1

## 2022-04-06 MED ORDER — ACETAMINOPHEN 650 MG RE SUPP: 650.0000 mg | Freq: Four times a day (QID) | RECTAL | Status: DC | PRN

## 2022-04-06 MED ORDER — HYDRALAZINE HCL 20 MG/ML IJ SOLN: 10.0000 mg | Freq: Once | INTRAMUSCULAR | Status: DC

## 2022-04-06 MED ORDER — LEVETIRACETAM 500 MG PO TABS: 750.0000 mg | ORAL_TABLET | Freq: Two times a day (BID) | ORAL | Status: DC

## 2022-04-06 MED ORDER — LIDOCAINE-PRILOCAINE 2.5-2.5 % EX CREA: 1.0000 | TOPICAL_CREAM | CUTANEOUS | Status: DC | PRN

## 2022-04-06 MED ORDER — HYDRALAZINE HCL 25 MG PO TABS
25.0000 mg | ORAL_TABLET | Freq: Three times a day (TID) | ORAL | Status: DC
  Administered 2022-04-06 – 2022-04-08 (×5): 25 mg via ORAL
  Filled 2022-04-06 (×5): qty 1

## 2022-04-06 MED ORDER — HEPARIN SODIUM (PORCINE) 5000 UNIT/ML IJ SOLN
5000.0000 [IU] | Freq: Three times a day (TID) | INTRAMUSCULAR | Status: DC
  Administered 2022-04-06 – 2022-04-08 (×6): 5000 [IU] via SUBCUTANEOUS
  Filled 2022-04-06 (×6): qty 1

## 2022-04-06 MED ORDER — AMIODARONE HCL 200 MG PO TABS
200.0000 mg | ORAL_TABLET | ORAL | Status: DC
  Administered 2022-04-07 – 2022-04-08 (×2): 200 mg via ORAL
  Filled 2022-04-06 (×2): qty 1

## 2022-04-06 MED ORDER — ATORVASTATIN CALCIUM 40 MG PO TABS
80.0000 mg | ORAL_TABLET | Freq: Every day | ORAL | Status: DC
  Administered 2022-04-06 – 2022-04-07 (×2): 80 mg via ORAL
  Filled 2022-04-06 (×2): qty 2

## 2022-04-06 MED ORDER — LABETALOL HCL 5 MG/ML IV SOLN
20.0000 mg | INTRAVENOUS | Status: AC
  Administered 2022-04-06: 20 mg via INTRAVENOUS
  Filled 2022-04-06: qty 4

## 2022-04-06 MED ORDER — SODIUM ZIRCONIUM CYCLOSILICATE 5 G PO PACK
10.0000 g | PACK | ORAL | Status: AC
  Administered 2022-04-06: 10 g via ORAL
  Filled 2022-04-06: qty 2

## 2022-04-06 MED ORDER — LEVETIRACETAM 500 MG PO TABS
750.0000 mg | ORAL_TABLET | ORAL | Status: DC
  Administered 2022-04-06: 750 mg via ORAL
  Filled 2022-04-06: qty 1

## 2022-04-06 MED ORDER — AMLODIPINE BESYLATE 5 MG PO TABS
10.0000 mg | ORAL_TABLET | Freq: Every day | ORAL | Status: DC
  Administered 2022-04-07 – 2022-04-08 (×2): 10 mg via ORAL
  Filled 2022-04-06 (×2): qty 2

## 2022-04-06 MED ORDER — PENTAFLUOROPROP-TETRAFLUOROETH EX AERO
1.0000 | INHALATION_SPRAY | CUTANEOUS | Status: DC | PRN
  Filled 2022-04-06: qty 30

## 2022-04-06 NOTE — Assessment & Plan Note (Addendum)
Resume Keppra -Add on Keppra level

## 2022-04-06 NOTE — ED Triage Notes (Signed)
Patient was at dialysis and was not acting right so they sent him here.  Patient will moan and grubble but not really wanting to talk to anyone.  Did tell ems to call his wife.  CBG 87 Did not complete any of dialysis treatment and complains of butt pain.

## 2022-04-06 NOTE — ED Notes (Signed)
Notified by dialysis RN that patient elected to end his treatment early and understands the life threatening risk of his potassium being too high due to not receiving his dialysis treatments fully.  Patient had already skipped his dialysis treatment on Saturday.

## 2022-04-06 NOTE — Consult Note (Signed)
[name]. Admit Date: 04/06/2022 04/06/2022 [name] Requesting Physician:  Philip Aspen MD  Reason for Consult:  ESRD, AMS HPI:  35M ESRD THS at [facility] in [place] presented to outpatient dialysis this morning where he was noted to have confusion and presented to the ED.  PMH includes admission in fall of last year for intracerebral hemorrhage, blood pressure related, atrial fibrillation, CAD, DM2, GERD, hypertension.  He was more recently admitted with osteomyelitis of the toe which was amputated.  Apparently he went to dialysis on 2/10 which was uneventful.  At baseline he is interactive, engaging, ambulatory.  Over the past 24 hours he has had slower speech and some twitching.  No recent medications.  No recent antibiotics.  In ED patient had labs notable for potassium of 6.1, BUN 72, hemoglobin 10.0.  Respiratory panel negative.  CT head with no acute intracranial abnormality but redemonstrated encephalomalacia along the posterior aspects of the bilateral superior frontal gyri.  Portable chest x-ray with no acute findings.  ROS Balance of 12 systems is negative w/ exceptions as above  PMH  Past Medical History:  Diagnosis Date   Anxiety    a.) on BZO (clonazepam) PRN   Aortic dilatation ([place]) 11/25/2020   a.) TTE 11/25/2020: Ao root measured 38 mm. b.) TTE 04/22/2021: Ao root measured 44 mm; c. 11/2021 Echo: Ao root 19m.   Atrial fibrillation ([facility]    a.) CHA2DS2-VASc = 6 (CHF, HTN, CVA x2, prior MI, T2DM). b.) rate/rhythm maintained on oral amiodarone + carvedilol; chronically anticoagulated using dose reduced apixaban + clopidogrel   BPH (benign prostatic hyperplasia)    Cardiac arrest ([facility] 07/26/2021   a.) in setting of hyperkalemia, NSTEMI, and seizure following missed HD; pulseless and apneic --> required 1 round of CPR prior to ROSC   Cellulitis    Cerebral hemorrhage ([facility]    a. 10/2021 interventricular hemorrhage in setting of HTN emergency and  DOAC/APT-->eliquis/plavix d/c'd.   Chronic diarrhea    Chronic heart failure with preserved ejection fraction (HFpEF) ([facility] 07/19/2017   a. 06/2017 Echo: EF 75%; b. 11/2020 Echo: EF 55-60%; c. 04/2021 Echo: EF 55-60%; d. 11/2021 Echo: EF 65-70%, GrII DD, nl RV fxn, sev dil LA, large circumferential pericardial eff w/o tamponade, triv MR, AoV sclerosis. Ao root 449m   Chronic pain of both ankles    Coronary artery disease 02/26/2020   a. 02/2020 Ant STEMI/PCI: LM nl, LAD 9010m.5x30 Resolute Onyx), RI nl, LCX min irregs, RCA min irregs. EF 65%.   Diabetes mellitus, type II ([facility]  Diabetic neuropathy ([facility]  Esophagitis    ESRD (end stage renal disease) on dialysis ([facility]  a.) [facility]; T-Th-Sat   Frequent falls    Gait instability    Gastritis    Gastroparesis    GERD (gastroesophageal reflux disease)    H/O enucleation of left eyeball    Heart palpitations    History of kidney stones    HLD (hyperlipidemia)    Hypertension    Insomnia    a.) uses melatonin PRN   Nausea and vomiting in adult    recurrent   NSTEMI (non-ST elevated myocardial infarction) ([facility]CC[place]6/05/2021   a.) hyperkalemic from missed HD; seizures; decompensated to cardiac arrest and required CPR x 1 round prior to ROSC.   Osteomyelitis (HCC)    Pericardial effusion    a. 11/2021 Echo: EF 65-70%, GrII DD, nl RV fxn, sev dil LA, large circumferential pericardial eff w/o tamponade,  triv MR, AoV sclerosis. Ao root 67m.   Seizure ([facility]    a.) last 07/26/2021 in setting of missed HD --> hyperkalemic at 6.5 --> pulseless/apneic and required CPR; discharged home on levetiracetam.   ST elevation myocardial infarction (STEMI) of anterior wall ([facility] 02/26/2020   a.) LHC/PCI 02/26/2020 --> EF 65%; LVEDP 11 mmHg; 90% mLAD (3.5 x 20 mm Resolute Onyx DES x 1)   Stroke ([facility] 04/2021   a.) documented as x 3 between 04/2021 and 06/2021   [facility]:  Procedure Laterality Date   A/V FISTULAGRAM Left 08/31/2021    Procedure: A/V Fistulagram;  Surgeon: DAlgernon Huxley MD;  [place] [facility];  Service: Cardiovascular;  Laterality: Left;   A/V FISTULAGRAM Left 01/25/2022   Procedure: A/V Fistulagram;  Surgeon: DAlgernon Huxley MD;  [place];  Service: Cardiovascular;  Laterality: Left;   AMPUTATION Left 03/16/2016   Procedure: AMPUTATION DIGIT LEFT HALLUX;  Surgeon: MTrula Slade DPM;  [place]  Service: Podiatry;  Laterality: Left;  can start around 5    AMPUTATION Right 02/09/2021   Procedure: AMPUTATION DIGIT;  Surgeon: BSherilyn Cooter MD;  Location[place]  Service: Orthopedics;  Laterality: Right;   AMPUTATION TOE Right 02/28/2022   Procedure: AMPUTATION TOE;  Surgeon: SLorenda Peck DPM;  Location: [name];  Service: Podiatry;  Laterality: Right;  right fifth toe   ARTHROSCOPIC REPAIR ACL Left    AV FISTULA PLACEMENT Right 05/31/2019   Procedure: Brachiocephalic AV fistula creation;  Surgeon: DAlgernon Huxley MD;  Location: [name];  Service: Vascular;  Laterality: Right;   AV FISTULA PLACEMENT Left 08/13/2021   Procedure: ARTERIOVENOUS (AV) FISTULA CREATION (BRACHIALCEPHALIC);  Surgeon: DAlgernon Huxley MD;  Location: [name];  Service: Vascular;  Laterality: Left;   COLONOSCOPY WITH PROPOFOL N/A 10/28/2015   Procedure: COLONOSCOPY WITH PROPOFOL;  Surgeon: MLollie Sails MD;  [facility];  Service: Endoscopy;  Laterality: N/A;   COLONOSCOPY WITH PROPOFOL N/A 10/29/2015   Procedure: COLONOSCOPY WITH PROPOFOL;  Surgeon: MLollie Sails MD;  [facility];  Service: Endoscopy;  Laterality: N/A;   CORONARY/GRAFT ACUTE MI REVASCULARIZATION N/A 02/26/2020   Procedure: Coronary/Graft Acute MI Revascularization;  Surgeon: VJettie Booze MD;  [place];  Service: Cardiovascular;  Laterality: N/A;   DIALYSIS/PERMA CATHETER INSERTION Right 04/26/2019   Perm Cath    DIALYSIS/PERMA CATHETER INSERTION N/A 04/26/2019    Procedure: DIALYSIS/PERMA CATHETER INSERTION;  Surgeon: DAlgernon Huxley MD;  [place];  Service: Cardiovascular;  Laterality: N/A;   DIALYSIS/PERMA CATHETER INSERTION N/A 05/25/2021   Procedure: DIALYSIS/PERMA CATHETER INSERTION;  Surgeon: DAlgernon Huxley MD;  [place];  Service: Cardiovascular;  Laterality: N/A;   DIALYSIS/PERMA CATHETER INSERTION N/A 08/31/2021   Procedure: DIALYSIS/PERMA CATHETER INSERTION;  Surgeon: DAlgernon Huxley MD;  [place];  Service: Cardiovascular;  Laterality: N/A;   DIALYSIS/PERMA CATHETER REMOVAL N/A 08/15/2019   Procedure: DIALYSIS/PERMA CATHETER REMOVAL;  Surgeon: SKatha Cabal MD;  [place];  Service: Cardiovascular;  Laterality: N/A;   DIALYSIS/PERMA CATHETER REMOVAL N/A 03/15/2022   Procedure: DIALYSIS/PERMA CATHETER REMOVAL;  Surgeon: DAlgernon Huxley MD;  [place] [facility];  Service: Cardiovascular;  Laterality: N/A;   EMBOLIZATION Right 02/06/2021   Procedure: EMBOLIZATION;  Surgeon: DAlgernon Huxley MD;  [place];  Service: Cardiovascular;  Laterality: Right;  Right  Upper Extremity Dialysis Access, Permcath Placement   ESOPHAGOGASTRODUODENOSCOPY (EGD) WITH PROPOFOL N/A 12/27/2017   Procedure: ESOPHAGOGASTRODUODENOSCOPY (EGD) WITH PROPOFOL;  Surgeon: Toledo, Benay Pike, MD;  Location: [name];  Service: Gastroenterology;  Laterality: N/A;   EYE SURGERY     INTRAVASCULAR ULTRASOUND/IVUS N/A 02/26/2020   Procedure: Intravascular Ultrasound/IVUS;  Surgeon: Jettie Booze, MD;  Location: [place] CV LAB;  Service: Cardiovascular;  Laterality: N/A;   LEFT HEART CATH AND CORONARY ANGIOGRAPHY N/A 02/26/2020   Procedure: LEFT HEART CATH AND CORONARY ANGIOGRAPHY;  Surgeon: Jettie Booze, MD;  Location: [name] CV LAB;  Service: Cardiovascular;  Laterality: N/A;   PROSTATE SURGERY  2016   TONSILECTOMY/ADENOIDECTOMY WITH  MYRINGOTOMY     TONSILLECTOMY     UPPER EXTREMITY ANGIOGRAPHY Right 11/26/2020   Procedure: Upper Extremity Angiography;  Surgeon: Algernon Huxley, MD;  Location: [place] CV LAB;  Service: Cardiovascular;  Laterality: Right;   UPPER EXTREMITY ANGIOGRAPHY Right 02/05/2021   Procedure: UPPER EXTREMITY ANGIOGRAPHY;  Surgeon: Algernon Huxley, MD;  Location: [place] [facility];  Service: Cardiovascular;  Laterality: Right;   FH  Family History  Problem Relation Age of Onset   CAD Father    Stroke Father    Diabetes Mellitus II Mother    Kidney failure Mother    Schizophrenia Mother    SH  reports that he has never smoked. He has never used smokeless tobacco. He reports that he does not drink alcohol and does not use drugs. Allergies  Allergies  Allergen Reactions   Promethazine Diarrhea and Other (See Comments)    Muscle cramps   Promethazine Hcl Other (See Comments)    Cramping all over  Other reaction(s): cramp all over   Other     Other reaction(s): Unknown Other reaction(s): Unknown   Home medications Prior to Admission medications   Medication Sig Start Date End Date Taking? Authorizing Provider  acarbose (PRECOSE) 50 MG tablet Take 50 mg by mouth 3 (three) times daily. 10/20/21   [provider]  acetaminophen (TYLENOL) 325 MG tablet Take 1-2 tablets (325-650 mg total) by mouth every 4 (four) hours as needed for mild pain. 05/04/21   Love, Ivan Anchors, PA-C  amiodarone (PACERONE) 200 MG tablet Take 1 tablet (200 mg total) by mouth every morning. 08/10/21 08/11/22  Swinyer, Lanice Schwab, NP  amLODipine (NORVASC) 10 MG tablet Take 1 tablet (10 mg total) by mouth daily. 02/28/22   Jennye Boroughs, MD  atorvastatin (LIPITOR) 80 MG tablet Take 1 tablet (80 mg total) by mouth daily. 05/04/21 04/29/22  Love, Ivan Anchors, PA-C  calcium acetate (PHOSLO) 667 MG capsule Take 2 capsules (1,334 mg total) by mouth 3 (three) times daily with meals. 05/04/21   Love, Ivan Anchors, PA-C  carvedilol (COREG)  25 MG tablet Take 25 mg by mouth 2 (two) times daily. 06/16/21   [provider]  cloNIDine (CATAPRES) 0.3 MG tablet Take 1 tablet (0.3 mg total) by mouth every 8 (eight) hours. Patient taking differently: Take 0.3 mg by mouth every 8 (eight) hours. Only if BP over 200 12/03/21   Bonnielee Haff, MD  diphenoxylate-atropine (LOMOTIL) 2.5-0.025 MG tablet Take as needed for diarrhea. Can take up to 4 times daily. 03/05/22   [provider]  dorzolamide-timolol (COSOPT) 22.3-6.8 MG/ML ophthalmic solution Place 1 drop into the right eye 2 (two) times daily.    [provider]  irbesartan (AVAPRO) 300 MG tablet Take 1 tablet (300 mg total) by mouth daily.  12/03/21   Bonnielee Haff, MD  levETIRAcetam (KEPPRA) 250 MG tablet Take 1 tablet (250 mg total) by mouth Every Tuesday,Thursday,and Saturday with dialysis. 07/30/21   Nita Sells, MD  levETIRAcetam (KEPPRA) 750 MG tablet Take 1 tablet (750 mg total) by mouth 2 (two) times daily. Patient taking differently: Take 750 mg by mouth 2 (two) times daily. Twice daily Only on dialysis days and once on all other days 12/05/21   Bonnielee Haff, MD  lidocaine-prilocaine (EMLA) cream Apply topically. 12/24/21   [provider]  nitroGLYCERIN (NITROSTAT) 0.4 MG SL tablet Place 1 tablet (0.4 mg total) under the tongue every 5 (five) minutes as needed for chest pain. 03/24/22 06/22/22  Jettie Booze, MD  omeprazole (PRILOSEC) 40 MG capsule Take 40 mg by mouth as needed.    [provider]  Wheat Dextrin (BENEFIBER) POWD 2 teaspoon in 6-8 ounces of water Orally twice a day for 30 days 03/11/22   [provider]    Current Medications Scheduled Meds: Continuous Infusions: PRN Meds:.  CBC Recent Labs  Lab 04/06/22 0751  WBC 6.4  HGB 10.0*  HCT 33.3*  MCV 73.3*  PLT [id number]   Basic Metabolic Panel Recent Labs  Lab 04/06/22 0751  NA 143  K 6.1*  CL 114*  CO2 17*  GLUCOSE 78  BUN 72*   CREATININE 10.66*  CALCIUM 8.5*    Physical Exam  Blood pressure (!) 150/88, pulse 63, temperature 97.8 F (36.6 C), temperature source Rectal, resp. rate 16, height 6' 3"$  (1.905 m), weight 87.1 kg, SpO2 97 %. GEN: Groggy, wakes up to verbal stimuli ENT: NCAT EYES: Eyes closed CV: Regular, normal S1 and S2 PULM: Clear bilaterally ABD: Soft, nontender SKIN: No rashes or lesions EXT: No significant edema VASCULAR ACCESS: LUE AVF with bruit and thrill  Assessment 46M ESRD here with AMS  ESRD THS LUE AVF [facility] AMS, unclera etiology, per ED/[name]; doubt uremia 4th Ventrical ICH 11/2021, sig improvement and was living at home, ambulatory, conversant after recovery HTN, no obvious hypervolemia Anemia: Hb stable CKD-BMM  Plan HD today, AVF, 3.5h, 2K, no heparin, 2-3L UF SBP > 137mHg Will follow along Daily weights, Daily Renal Panel, Strict I/Os, Avoid nephrotoxins (NSAIDs, judicious IV Contrast)    RRexene Agent 04/06/2022, 10:13 AM

## 2022-04-06 NOTE — ED Provider Notes (Signed)
[facility] Provider Note   CSN: [id number] Arrival date & time: 04/06/22  4098     History  Chief Complaint  Patient presents with   Altered Mental Status    [name] Keinan Brouillet. is a 52 y.o. male.  52 year old male with a history of CAD, ICH, ESRD on Tuesday/Thursday/Saturday IHD who presents emergency department with altered mental status.  History obtained per the patient and his wife.  The patient's wife states that over the past few months he is been intermittently slow to respond with tremors.  Reports that yesterday he started becoming more slow to respond.  Has been seen by neurologist and has had an MRI on 03/01/2022 that did not show any new changes.  She says that they are unsure what is causing his symptoms.  Went to dialysis today after missing Saturday and was feeling "unwell" according to the patient but is unable to further characterize what he means by that.  Denies any chest pain, shortness of breath, runny nose, cough, nausea or vomiting or diarrhea.  Does not make urine currently.  No new medication changes.  Denies any focal neurologic deficits or slurred speech.        Home Medications Prior to Admission medications   Medication Sig Start Date End Date Taking? Authorizing Provider  acetaminophen (TYLENOL) 325 MG tablet Take 1-2 tablets (325-650 mg total) by mouth every 4 (four) hours as needed for mild pain. 05/04/21  Yes Love, Ivan Anchors, PA-C  amiodarone (PACERONE) 200 MG tablet Take 1 tablet (200 mg total) by mouth every morning. 08/10/21 08/11/22 Yes Swinyer, Lanice Schwab, NP  amLODipine (NORVASC) 10 MG tablet Take 1 tablet (10 mg total) by mouth daily. Patient taking differently: Take 5 mg by mouth daily. 02/28/22  Yes Jennye Boroughs, MD  atorvastatin (LIPITOR) 80 MG tablet Take 1 tablet (80 mg total) by mouth daily. 05/04/21 04/29/22 Yes Love, Ivan Anchors, PA-C  calcium acetate (PHOSLO) 667 MG capsule Take 2 capsules (1,334 mg  total) by mouth 3 (three) times daily with meals. 05/04/21  Yes Love, Ivan Anchors, PA-C  carvedilol (COREG) 25 MG tablet Take 25 mg by mouth 2 (two) times daily. 06/16/21  Yes [provider]  cloNIDine (CATAPRES) 0.3 MG tablet Take 1 tablet (0.3 mg total) by mouth every 8 (eight) hours. 12/03/21  Yes Bonnielee Haff, MD  diphenoxylate-atropine (LOMOTIL) 2.5-0.025 MG tablet Take as needed for diarrhea. Can take up to 4 times daily. 03/05/22  Yes [provider]  dorzolamide-timolol (COSOPT) 22.3-6.8 MG/ML ophthalmic solution Place 1 drop into the right eye 2 (two) times daily.   Yes [provider]  levETIRAcetam (KEPPRA) 750 MG tablet Take 1 tablet (750 mg total) by mouth 2 (two) times daily. Patient taking differently: Take 750 mg by mouth 2 (two) times daily. Twice daily Only on dialysis days and once on all other days 12/05/21  Yes Bonnielee Haff, MD  lidocaine-prilocaine (EMLA) cream Apply topically. 12/24/21  Yes [provider]  nitroGLYCERIN (NITROSTAT) 0.4 MG SL tablet Place 1 tablet (0.4 mg total) under the tongue every 5 (five) minutes as needed for chest pain. 03/24/22 06/22/22 Yes Jettie Booze, MD  hydrALAZINE (APRESOLINE) 25 MG tablet Take 25 mg by mouth 3 (three) times daily. 03/30/22   [provider]  irbesartan (AVAPRO) 300 MG tablet Take 1 tablet (300 mg total) by mouth daily. Patient not taking: Reported on 04/06/2022 12/03/21   Bonnielee Haff, MD  levETIRAcetam ([place]) 250  MG tablet Take 1 tablet (250 mg total) by mouth Every Tuesday,Thursday,and Saturday with dialysis. Patient not taking: Reported on 04/06/2022 07/30/21   Nita Sells, MD  Wheat Dextrin (BENEFIBER) POWD 2 teaspoon in 6-8 ounces of water Orally twice a day for 30 days Patient not taking: Reported on 04/06/2022 03/11/22   [provider]      Allergies    Promethazine, Promethazine hcl, and Other    Review of Systems   Review of Systems  Physical  Exam Updated Vital Signs BP (!) 189/74   Pulse 74   Temp 98.1 F (36.7 C) (Oral)   Resp (!) 7   Ht 6\' 3"  (1.905 m)   Wt 85.3 kg   SpO2 98%   BMI 23.50 kg/m  Physical Exam Vitals and nursing note reviewed.  Constitutional:      General: He is not in acute distress.    Appearance: He is well-developed.     Comments: Tired appearing but responds appropriately to questions  HENT:     Head: Normocephalic and atraumatic.     Right Ear: External ear normal.     Left Ear: External ear normal.     Nose: Nose normal.  Eyes:     Extraocular Movements: Extraocular movements intact.     Conjunctiva/sclera: Conjunctivae normal.     Comments: Enucleated left eye.  Right pupil 4 mm and reactive  Cardiovascular:     Rate and Rhythm: Normal rate and regular rhythm.     Heart sounds: Normal heart sounds.  Pulmonary:     Effort: Pulmonary effort is normal. No respiratory distress.     Breath sounds: Normal breath sounds.  Abdominal:     General: There is no distension.     Palpations: Abdomen is soft. There is no mass.     Tenderness: There is no abdominal tenderness. There is no guarding.  Musculoskeletal:     Cervical back: Normal range of motion and neck supple.     Right lower leg: No edema.     Left lower leg: No edema.     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  Neurological:     General: No focal deficit present.     Mental Status: He is alert and oriented to person, place, and time. Mental status is at baseline.     Cranial Nerves: No cranial nerve deficit.     Sensory: No sensory deficit.     Comments: Generalized weakness of all 4 extremities  Psychiatric:        Mood and Affect: Mood normal.        Behavior: Behavior normal.     ED Results / Procedures / Treatments   Labs (all labs ordered are listed, but only abnormal results are displayed) Labs Reviewed  COMPREHENSIVE METABOLIC PANEL - Abnormal;  Notable for the following components:      Result Value   Potassium 6.1 (*)    Chloride 114 (*)    CO2 17 (*)    BUN 72 (*)    Creatinine, Ser 10.66 (*)    Calcium 8.5 (*)    Total Protein 6.2 (*)    Albumin 3.3 (*)    GFR, Estimated 5 (*)    All other components within normal limits  CBC - Abnormal; Notable for the following components:   Hemoglobin 10.0 (*)    HCT 33.3 (*)    MCV  73.3 (*)    MCH 22.0 (*)    RDW 25.9 (*)    nRBC 2.3 (*)    All other components within normal limits  TROPONIN I (HIGH SENSITIVITY) - Abnormal; Notable for the following components:   Troponin I (High Sensitivity) 29 (*)    All other components within normal limits  TROPONIN I (HIGH SENSITIVITY) - Abnormal; Notable for the following components:   Troponin I (High Sensitivity) 29 (*)    All other components within normal limits  RESP PANEL BY RT-PCR (RSV, FLU A&B, COVID)  RVPGX2  ETHANOL  HEPATITIS B SURFACE ANTIGEN  HEPATITIS B SURFACE ANTIBODY, QUANTITATIVE  CBG MONITORING, ED    EKG EKG Interpretation  Date/Time:  Tuesday April 06 2022 07:58:56 EST Ventricular Rate:  62 PR Interval:  200 QRS Duration: 111 QT Interval:  452 QTC Calculation: 459 R Axis:   44 Text Interpretation: Sinus rhythm Probable left atrial enlargement Left ventricular hypertrophy Anterior Q waves, possibly due to LVH Baseline wander in lead(s) V5 No significant change since last tracing Confirmed by Margaretmary Eddy [phone]) on 04/06/2022 8:15:32 AM  Radiology DG Chest Port 1 View  Result Date: 04/06/2022 CLINICAL DATA:  SOB EXAM: PORTABLE CHEST 1 VIEW COMPARISON:  03/01/2022 FINDINGS: Cardiac silhouette appears prominent. No pneumonia or pulmonary edema. No pneumothorax or pleural effusion. IMPRESSION: Enlarged cardiac silhouette. Electronically Signed   By: Sammie Bench M.D.   On: 04/06/2022 08:47   CT Head Wo Contrast  Result Date: 04/06/2022 CLINICAL DATA:  Mental status change, unknown cause. EXAM: CT  HEAD WITHOUT CONTRAST TECHNIQUE: Contiguous axial images were obtained from the base of the skull through the vertex without intravenous contrast. RADIATION DOSE REDUCTION: This exam was performed according to the departmental dose-optimization program which includes automated exposure control, adjustment of the mA and/or kV according to patient size and/or use of iterative reconstruction technique. COMPARISON:  Head CT 03/04/2022. FINDINGS: Brain: No acute intracranial hemorrhage. 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 Mild chronic small-vessel disease. No hydrocephalus or extra-axial collection. Vascular: No hyperdense vessel. Skull: No calvarial fracture or suspicious bone lesion. Skull base is unremarkable. Sinuses/Orbits: Mastoids and paranasal sinuses are well aerated. Partial left globe prosthesis. Other: Extensive calcifications of the scalp vessels. IMPRESSION: 1. No acute intracranial abnormality. 2. 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 Electronically Signed   By: Emmit Alexanders M.D.   On: 04/06/2022 08:42    Procedures Procedures   Medications Ordered in ED Medications  pentafluoroprop-tetrafluoroeth (GEBAUERS) aerosol 1 Application (has no administration in time range)  lidocaine (PF) (XYLOCAINE) 1 % injection 5 mL (has no administration in time range)  lidocaine-prilocaine (EMLA) cream 1 Application (has no administration in time range)  sodium zirconium cyclosilicate (LOKELMA) packet 10 g (10 g Oral Given 04/06/22 0936)  albuterol (PROVENTIL) (2.5 MG/3ML) 0.083% nebulizer solution 10 mg (10 mg Nebulization Given 04/06/22 0939)  labetalol (NORMODYNE) injection 20 mg (20  mg Intravenous Given 04/06/22 1607)    ED Course/ Medical Decision Making/ A&P Clinical Course as of 04/06/22 1758  Tue Apr 06, 2022  0948 Dr Joelyn Oms from nephrology aware.  Will evaluate the patient in the emergency department for iHD [RP]  1054 Patient to receive dialysis here in the emergency department [RP]  1627 Patient received 2 hours  and 45 minutes of his 3-hour and 30-minute session.  Patient request to be taken off the pump.  Dr Denton Brick from hospitalist to admit.  [RP]    Clinical Course User Index [RP] Fransico Meadow, MD                            Medical Decision Making Amount and/or Complexity of Data Reviewed Labs: ordered. Radiology: ordered.  Risk Prescription drug management. Decision regarding hospitalization.   Melody Demarrius Guerrero. is a 52 y.o. male with comorbidities that complicate the patient evaluation including CAD, ICH, ESRD on IHD who presents emergency department with altered mental status at dialysis but appears to not be at baseline per the patient's wife  Initial Ddx:  Delirium, ICH, stroke, dialysis disequilibrium, electrolyte abnormality, uremia, MI  MDM:  Unclear was causing the patient's symptoms at this time.  Appears to have a similar presentation and this appears to be a subacute process rather than acute.  Has already had MRI and stroke evaluation which was reassuring.  Do not feel that patient has any new deficits on exam today that would warrant repeat stroke evaluation.  May be due to some process that is causing delirium given the waxing and waning nature or may be early onset dementia.  Will check for signs of infection.  Will also check for electrolyte abnormalities including uremia.  Will obtain CT head to evaluate for ICH.  Patient has been on dialysis for a long period of time so dialysis disequilibrium less likely.  With his generalized weakness will get EKG and troponin to assess for MI.  Plan:  Labs Troponin COVID/flu Chest  x-ray CT head EKG  ED Summary/Re-evaluation:  Patient was reevaluated in the emergency department and remained stable.  Labs showed elevated BUN and potassium 6.1.  No EKG changes.  Was shifted with albuterol and also given Lokelma.  Arranged for emergent dialysis.  Upon returning from dialysis patient was hypertensive.  Appeared to be confused again and is only alert and oriented to self.  Unclear what is causing the patient's symptoms.  No new focal neurologic deficits and his pupils are equal.  Was given labetalol and will be admitted to medicine for further management of his change in mental status.  At this time given his history do not feel that repeat imaging is necessary at this time.  This patient presents to the ED for concern of complaints listed in HPI, this involves an extensive number of treatment options, and is a complaint that carries with it a high risk of complications and morbidity. Disposition including potential need for admission considered.   Dispo: Admit to Floor  Additional history obtained from spouse Records reviewed DC Summary The following labs were independently interpreted: Chemistry and show  hyperkalemia I independently reviewed the following imaging with scope of interpretation limited to determining acute life threatening conditions related to emergency care: CT Head and agree with the radiologist interpretation with the following exceptions: None I personally reviewed and interpreted cardiac monitoring: normal sinus rhythm  I personally reviewed and interpreted the pt's EKG: see above for interpretation  I have reviewed the patients home medications and made adjustments as needed Consults: Nephrology  Final Clinical Impression(s) / ED Diagnoses Final diagnoses:  Disorientation  Hyperkalemia  Secondary hypertension    Rx / DC Orders ED Discharge Orders     None         Fransico Meadow, MD 04/06/22  1759  

## 2022-04-06 NOTE — Assessment & Plan Note (Signed)
Rate controlled on amiodarone and carvedilol.  Not on anticoagulation. -Resume amiodarone and carvedilol

## 2022-04-06 NOTE — H&P (Signed)
History and Physical    [name]. PF:[id number] DOB: 06-29-1970 DOA: 04/06/2022  PCP: [name], MD   Patient coming from: Home  I have personally briefly reviewed patient's old medical records in [place]  Chief Complaint: AMS  HPI: [name]. is a 52 y.o. male with medical history significant for diabetes mellitus, seizure, atrial fibrillation, diastolic CHF, coronary artery disease, ESRD. Patient was sent to the ED from HD center reports of change in speech, altered mental status. At the time of my evaluation, patient is answering questions appropriately, sometimes choosing not to answer, and occasionally answer that he does not remember-like saying he wanted to stop his dialysis, and if and what blood pressure medications he took today.  He otherwise denies pain or any other symptoms, does not make urine.  He tells me he is here because of his blood pressure. Per ED notes, patient was sent to the ED from dialysis center because he was not acting right. Wife reported that patient has been slow to respond, with tremors over the past few months.  Evaluated by his neurologist and had MRI 03/01/2022 which was unremarkable for any new changes.  Patient missed his dialysis on Saturday- 2/10, because he was not feeling well.  Did not get dialyzed today either.  ED Course: Blood pressure initially 120s to 150s, later increased to 212/89 after he was brought down from dialysis.  Potassium 6.1.  BUN 78. Head CT negative for acute abnormality, showed encephalomalacia along the posterior aspect of the bilateral frontal gyri and right greater than left paracentral low-dose likely sequelae of prior ACA or ACA MCA watershed territory infarcts. Mental status was initially okay, he was sent up for dialysis, patient completed 2 hours 45 minutes of his 3-1/2 planned session.  He requested to stop HD, and that he wanted to sign out AMA, he was told about the potentially  life-threatening complications of hyperkalemia, he requested to be discharged anyway.  He was sent down to the ED, and it was noticed that he was more confused and blood pressure was elevated.  Patient was refusing to answer questions. 31m IV labetalol given its some improvement in blood pressure to 180s. Hospitalist to admit for elevated blood pressure and altered mental status.  Review of Systems: As per HPI all other systems reviewed and negative.  Past Medical History:  Diagnosis Date   Anxiety    a.) on BZO (clonazepam) PRN   Aortic dilatation ([place] 11/25/2020   a.) TTE 11/25/2020: Ao root measured 38 mm. b.) TTE 04/22/2021: Ao root measured 44 mm; c. 11/2021 Echo: Ao root 442m   Atrial fibrillation ([facility]   a.) CHA2DS2-VASc = 6 (CHF, HTN, CVA x2, prior MI, T2DM). b.) rate/rhythm maintained on oral amiodarone + carvedilol; chronically anticoagulated using dose reduced apixaban + clopidogrel   BPH (benign prostatic hyperplasia)    Cardiac arrest ([facility]/05/2021   a.) in setting of hyperkalemia, NSTEMI, and seizure following missed HD; pulseless and apneic --> required 1 round of CPR prior to ROSC   Cellulitis    Cerebral hemorrhage ([facility]   a. 10/2021 interventricular hemorrhage in setting of HTN emergency and DOAC/APT-->eliquis/plavix d/c'd.   Chronic diarrhea    Chronic heart failure with preserved ejection fraction (HFpEF) (HC[place]05/28/2019   a. 06/2017 Echo: EF 75%; b. 11/2020 Echo: EF 55-60%; c. 04/2021 Echo: EF 55-60%; d. 11/2021 Echo: EF 65-70%, GrII DD, nl RV fxn, sev dil LA, large circumferential pericardial eff w/o tamponade,  triv MR, AoV sclerosis. Ao root 56m.   Chronic pain of both ankles    Coronary artery disease 02/26/2020   a. 02/2020 Ant STEMI/PCI: LM nl, LAD 978m3.5x30 Resolute Onyx), RI nl, LCX min irregs, RCA min irregs. EF 65%.   Diabetes mellitus, type II ([facility]   Diabetic neuropathy ([facility]   Esophagitis    ESRD (end stage renal disease) on dialysis ([facility]    a.) [facility]; T-Th-Sat   Frequent falls    Gait instability    Gastritis    Gastroparesis    GERD (gastroesophageal reflux disease)    H/O enucleation of left eyeball    Heart palpitations    History of kidney stones    HLD (hyperlipidemia)    Hypertension    Insomnia    a.) uses melatonin PRN   Nausea and vomiting in adult    recurrent   NSTEMI (non-ST elevated myocardial infarction) (HCAsh[place]06/05/2021   a.) hyperkalemic from missed HD; seizures; decompensated to cardiac arrest and required CPR x 1 round prior to ROSC.   Osteomyelitis (HCC)    Pericardial effusion    a. 11/2021 Echo: EF 65-70%, GrII DD, nl RV fxn, sev dil LA, large circumferential pericardial eff w/o tamponade, triv MR, AoV sclerosis. Ao root 4068m  Seizure (HCCAthens  a.) last 07/26/2021 in setting of missed HD --> hyperkalemic at 6.5 --> pulseless/apneic and required CPR; discharged home on levetiracetam.   ST elevation myocardial infarction (STEMI) of anterior wall (HCCWadena1/05/2020   a.) LHC/PCI 02/26/2020 --> EF 65%; LVEDP 11 mmHg; 90% mLAD (3.5 x 20 mm Resolute Onyx DES x 1)   Stroke (HCCCuba3/2023   a.) documented as x 3 between 04/2021 and 06/2021    Past Surgical History:  Procedure Laterality Date   A/V FISTULAGRAM Left 08/31/2021   Procedure: A/V Fistulagram;  Surgeon: DewAlgernon HuxleyD;  [facility];  Service: Cardiovascular;  Laterality: Left;   A/V FISTULAGRAM Left 01/25/2022   Procedure: A/V Fistulagram;  Surgeon: DewAlgernon HuxleyD;  [facility];  Service: Cardiovascular;  Laterality: Left;   AMPUTATION Left 03/16/2016   Procedure: AMPUTATION DIGIT LEFT HALLUX;  Surgeon: MatTrula SladePM;  [facility] [place] [place];  Laterality: Left;  can start around 5    AMPUTATION Right 02/09/2021   Procedure: AMPUTATION DIGIT;  Surgeon: BenSherilyn CooterD;  [facility] [place] [facility];  Laterality: Right;   AMPUTATION TOE Right 02/28/2022   Procedure:  AMPUTATION TOE;  Surgeon: SikLorenda PeckPM;  [name];  Service: Podiatry;  Laterality: Right;  right fifth toe   ARTHROSCOPIC REPAIR ACL Left    AV FISTULA PLACEMENT Right 05/31/2019   Procedure: Brachiocephalic AV fistula creation;  Surgeon: DewAlgernon HuxleyD;  [name];  Service: Vascular;  Laterality: Right;   AV FISTULA PLACEMENT Left 08/13/2021   Procedure: ARTERIOVENOUS (AV) FISTULA CREATION (BRACHIALCEPHALIC);  Surgeon: DewAlgernon HuxleyD;  [name];  Service: Vascular;  Laterality: Left;   COLONOSCOPY WITH PROPOFOL N/A 10/28/2015   Procedure: COLONOSCOPY WITH PROPOFOL;  Surgeon: MarLollie SailsD;  [facility];  Service: Endoscopy;  Laterality: N/A;   COLONOSCOPY WITH PROPOFOL N/A 10/29/2015   Procedure: COLONOSCOPY WITH PROPOFOL;  Surgeon: MarLollie SailsD;  [facility];  Service: Endoscopy;  Laterality: N/A;   CORONARY/GRAFT ACUTE MI REVASCULARIZATION N/A 02/26/2020   Procedure: Coronary/Graft Acute MI Revascularization;  Surgeon: Jettie Booze, MD;  Location: [place] [facility];  Service: Cardiovascular;  Laterality: N/A;   DIALYSIS/PERMA CATHETER INSERTION Right 04/26/2019   Perm Cath    DIALYSIS/PERMA CATHETER INSERTION N/A 04/26/2019   Procedure: DIALYSIS/PERMA CATHETER INSERTION;  Surgeon: Algernon Huxley, MD;  Location: [place] [facility];  Service: Cardiovascular;  Laterality: N/A;   DIALYSIS/PERMA CATHETER INSERTION N/A 05/25/2021   Procedure: DIALYSIS/PERMA CATHETER INSERTION;  Surgeon: Algernon Huxley, MD;  Location: [place] [facility];  Service: Cardiovascular;  Laterality: N/A;   DIALYSIS/PERMA CATHETER INSERTION N/A 08/31/2021   Procedure: DIALYSIS/PERMA CATHETER INSERTION;  Surgeon: Algernon Huxley, MD;  Location: [place] [facility];  Service: Cardiovascular;  Laterality: N/A;   DIALYSIS/PERMA CATHETER REMOVAL N/A 08/15/2019   Procedure: DIALYSIS/PERMA CATHETER REMOVAL;  Surgeon: Katha Cabal, MD;   Location: [place] [facility];  Service: Cardiovascular;  Laterality: N/A;   DIALYSIS/PERMA CATHETER REMOVAL N/A 03/15/2022   Procedure: DIALYSIS/PERMA CATHETER REMOVAL;  Surgeon: Algernon Huxley, MD;  Location: [place] [facility];  Service: Cardiovascular;  Laterality: N/A;   EMBOLIZATION Right 02/06/2021   Procedure: EMBOLIZATION;  Surgeon: Algernon Huxley, MD;  Location: [place] [facility];  Service: Cardiovascular;  Laterality: Right;  Right Upper Extremity Dialysis Access, Permcath Placement   ESOPHAGOGASTRODUODENOSCOPY (EGD) WITH PROPOFOL N/A 12/27/2017   Procedure: ESOPHAGOGASTRODUODENOSCOPY (EGD) WITH PROPOFOL;  Surgeon: [place], Benay Pike, MD;  [name];  Service: Gastroenterology;  Laterality: N/A;   EYE SURGERY     INTRAVASCULAR ULTRASOUND/IVUS N/A 02/26/2020   Procedure: Intravascular Ultrasound/IVUS;  Surgeon: Jettie Booze, MD;  Location: [place] [facility];  Service: Cardiovascular;  Laterality: N/A;   LEFT HEART CATH AND CORONARY ANGIOGRAPHY N/A 02/26/2020   Procedure: LEFT HEART CATH AND CORONARY ANGIOGRAPHY;  Surgeon: Jettie Booze, MD;  Location: [place] [facility];  Service: Cardiovascular;  Laterality: N/A;   PROSTATE SURGERY  2016   TONSILECTOMY/ADENOIDECTOMY WITH MYRINGOTOMY     TONSILLECTOMY     UPPER EXTREMITY ANGIOGRAPHY Right 11/26/2020   Procedure: Upper Extremity Angiography;  Surgeon: Algernon Huxley, MD;  Location: [place] [facility];  Service: Cardiovascular;  Laterality: Right;   UPPER EXTREMITY ANGIOGRAPHY Right 02/05/2021   Procedure: UPPER EXTREMITY ANGIOGRAPHY;  Surgeon: Algernon Huxley, MD;  Location: [place] [facility];  Service: Cardiovascular;  Laterality: Right;     reports that he has never smoked. He has never used smokeless tobacco. He reports that he does not drink alcohol and does not use drugs.  Allergies  Allergen Reactions   Promethazine Diarrhea and Other (See Comments)    Muscle cramps   Promethazine Hcl Other  (See Comments)    Cramping all over  Other reaction(s): cramp all over   Other     Other reaction(s): Unknown Other reaction(s): Unknown    Family History  Problem Relation Age of Onset   CAD Father    Stroke Father    Diabetes Mellitus II Mother    Kidney failure Mother    Schizophrenia Mother    Prior to Admission medications   Medication Sig Start Date End Date Taking? Authorizing Provider  acetaminophen (TYLENOL) 325 MG tablet Take 1-2 tablets (325-650 mg total) by mouth every 4 (four) hours as needed for mild pain. 05/04/21  Yes Love, Ivan Anchors, PA-C  amiodarone (PACERONE) 200 MG tablet Take 1 tablet (200 mg total) by mouth every morning. 08/10/21 08/11/22 Yes Swinyer, Lanice Schwab, NP  amLODipine (NORVASC) 10 MG tablet Take 1  tablet (10 mg total) by mouth daily. Patient taking differently: Take 5 mg by mouth daily. 02/28/22  Yes Jennye Boroughs, MD  atorvastatin (LIPITOR) 80 MG tablet Take 1 tablet (80 mg total) by mouth daily. 05/04/21 04/29/22 Yes Love, Ivan Anchors, PA-C  calcium acetate (PHOSLO) 667 MG capsule Take 2 capsules (1,334 mg total) by mouth 3 (three) times daily with meals. 05/04/21  Yes Love, Ivan Anchors, PA-C  carvedilol (COREG) 25 MG tablet Take 25 mg by mouth 2 (two) times daily. 06/16/21  Yes [provider]  cloNIDine (CATAPRES) 0.3 MG tablet Take 1 tablet (0.3 mg total) by mouth every 8 (eight) hours. 12/03/21  Yes Bonnielee Haff, MD  diphenoxylate-atropine (LOMOTIL) 2.5-0.025 MG tablet Take as needed for diarrhea. Can take up to 4 times daily. 03/05/22  Yes [provider]  dorzolamide-timolol (COSOPT) 22.3-6.8 MG/ML ophthalmic solution Place 1 drop into the right eye 2 (two) times daily.   Yes [provider]  levETIRAcetam (KEPPRA) 750 MG tablet Take 1 tablet (750 mg total) by mouth 2 (two) times daily. Patient taking differently: Take 750 mg by mouth 2 (two) times daily. Twice daily Only on dialysis days and once on all other days 12/05/21  Yes  Bonnielee Haff, MD  lidocaine-prilocaine (EMLA) cream Apply topically. 12/24/21  Yes [provider]  nitroGLYCERIN (NITROSTAT) 0.4 MG SL tablet Place 1 tablet (0.4 mg total) under the tongue every 5 (five) minutes as needed for chest pain. 03/24/22 06/22/22 Yes Jettie Booze, MD  hydrALAZINE (APRESOLINE) 25 MG tablet Take 25 mg by mouth 3 (three) times daily. 03/30/22   [provider]  irbesartan (AVAPRO) 300 MG tablet Take 1 tablet (300 mg total) by mouth daily. Patient not taking: Reported on 04/06/2022 12/03/21   Bonnielee Haff, MD  levETIRAcetam (KEPPRA) 250 MG tablet Take 1 tablet (250 mg total) by mouth Every Tuesday,Thursday,and Saturday with dialysis. Patient not taking: Reported on 04/06/2022 07/30/21   Nita Sells, MD  Wheat Dextrin (BENEFIBER) POWD 2 teaspoon in 6-8 ounces of water Orally twice a day for 30 days Patient not taking: Reported on 04/06/2022 03/11/22   [provider]    Physical Exam: Vitals:   04/06/22 1630 04/06/22 1745 04/06/22 1751 04/06/22 1835  BP: (!) 187/84 (!) 189/74  (!) 206/69  Pulse: 72 74  77  Resp: 11 (!) 7  16  Temp:   98.1 F (36.7 C) 98.2 F (36.8 C)  TempSrc:   Oral   SpO2: 98% 98%  95%  Weight:      Height:        Constitutional: Awake, but consciously keeping his eyes closed, calm, comfortable Vitals:   04/06/22 1630 04/06/22 1745 04/06/22 1751 04/06/22 1835  BP: (!) 187/84 (!) 189/74  (!) 206/69  Pulse: 72 74  77  Resp: 11 (!) 7  16  Temp:   98.1 F (36.7 C) 98.2 F (36.8 C)  TempSrc:   Oral   SpO2: 98% 98%  95%  Weight:      Height:       Eyes: Enucleation of left eyeball.  Left eyelids closed.  ENMT: Mucous membranes are moist.  Neck: normal, supple, no masses, no thyromegaly Respiratory: clear to auscultation bilaterally, no wheezing, no crackles. Normal respiratory effort. No accessory muscle use.  Cardiovascular: Regular rate and rhythm, no murmurs / rubs / gallops.  1+ pitting  bilateral lower extremity edema to mid leg. Abdomen: no tenderness, no masses palpated. No hepatosplenomegaly. Bowel sounds positive.  Musculoskeletal: no clubbing / cyanosis. No joint deformity upper and lower extremities.  Skin: no rashes, lesions, ulcers. No induration Neurologic: Moving all extremities spontaneously, 4/5 strength bilateral lower extremity, good grip strength bilateral upper extremity, no facial asymmetry except for left eye enucleation, speech clear and fluent without evidence of aphasia, sometimes requires prompting to answer questions Psychiatric: Appears to have improved at the time of my evaluation compared to initial presentation in ED - awake, eyes closed, may be slightly lethargic- hard to tell, some of this may be poor patient effort or electively not answering questions   Labs on Admission: I have personally reviewed following labs and imaging studies  CBC: Recent Labs  Lab 04/06/22 0751  WBC 6.4  HGB 10.0*  HCT 33.3*  MCV 73.3*  PLT [id number]   Basic Metabolic Panel: Recent Labs  Lab 04/06/22 0751  NA 143  K 6.1*  CL 114*  CO2 17*  GLUCOSE 78  BUN 72*  CREATININE 10.66*  CALCIUM 8.5*   GFR: Estimated Creatinine Clearance: 9.5 mL/min (A) (by C-G formula based on SCr of 10.66 mg/dL (H)). Liver Function Tests: Recent Labs  Lab 04/06/22 0751  AST 21  ALT 25  ALKPHOS 96  BILITOT 0.6  PROT 6.2*  ALBUMIN 3.3*   CBG: Recent Labs  Lab 04/06/22 0745  GLUCAP 82    Radiological Exams on Admission: DG Chest Port 1 View  Result Date: 04/06/2022 CLINICAL DATA:  SOB EXAM: PORTABLE CHEST 1 VIEW COMPARISON:  03/01/2022 FINDINGS: Cardiac silhouette appears prominent. No pneumonia or pulmonary edema. No pneumothorax or pleural effusion. IMPRESSION: Enlarged cardiac silhouette. Electronically Signed   By: Sammie Bench M.D.   On: 04/06/2022 08:47   CT Head Wo Contrast  Result Date: 04/06/2022 CLINICAL DATA:  Mental status change, unknown cause. EXAM:  CT HEAD WITHOUT CONTRAST TECHNIQUE: Contiguous axial images were obtained from the base of the skull through the vertex without intravenous contrast. RADIATION DOSE REDUCTION: This exam was performed according to the departmental dose-optimization program which includes automated exposure control, adjustment of the mA and/or kV according to patient size and/or use of iterative reconstruction technique. COMPARISON:  Head CT 03/04/2022. FINDINGS: Brain: No acute intracranial hemorrhage. 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 Mild chronic small-vessel disease. No hydrocephalus or extra-axial collection. Vascular: No hyperdense vessel. Skull: No calvarial fracture or suspicious bone lesion. Skull base is unremarkable. Sinuses/Orbits: Mastoids and paranasal sinuses are well aerated. Partial left globe prosthesis. Other: Extensive calcifications of the scalp vessels. IMPRESSION: 1. No acute intracranial abnormality. 2. 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 Electronically Signed   By: Emmit Alexanders M.D.   On: 04/06/2022 08:42    EKG: Independently reviewed.  Sinus rhythm, rate 62, QTc 459.  No significant ST or T wave changes from prior.  Assessment/Plan Principal Problem:   Encephalopathy Active Problems:   Seizure ([place])   Uncontrolled type 2 diabetes mellitus with hyperglycemia, with long-term current use of insulin (HCC)   Paroxysmal atrial fibrillation (HCC)   Chronic Grade 2 diastolic heart failure/EF 55 to 60%   ESRD on hemodialysis (HCC)  Assessment and Plan: * Encephalopathy Encephalopathy type as yet unspecified.  Currently appears to have improved.  Differentials include  hypertensive encephalopathy, ? encephalomalacia as etiology.  Afebrile without leukocytosis.  Blood  pressure as high as 99991111 systolic.  Symptoms ongoing over the past few months. 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 -Recent MRI 03/01/2022 without acute abnormality, shows similar remote bifrontal cortical infarcts, prior hemorrhage. -IV hydralazine 10 mg as needed. -Will consult neurology of seizures, and if further brain imaging is needed   Seizure ([place]) Resume Keppra -Add on Keppra level  Uncontrolled type 2 diabetes mellitus with hyperglycemia, with long-term current use of insulin ([place]) Not on medication. - CBG BID  Paroxysmal atrial fibrillation (HCC) Rate controlled on amiodarone and carvedilol.  Not on anticoagulation. -Resume amiodarone and carvedilol  Chronic Grade 2 diastolic heart failure/EF 55 to 60% 1+ bilateral lower extremity edema, patient stable chronic and unchanged.  Recent echo 02/2022 EF of 60 to 65% severe LVH. -Volume status per HD  ESRD on hemodialysis ([place]) Completed 2 hours 45 minutes of initial plan to 3-1/2 hours of dialysis.  Potassium 6.1.  BUN 78. Milly Jakob given in ED -BMP in a.m. -Resume renal meds  Hypertensive urgency Blood pressure up to 99991111 systolic.  Given 20 mg of labetalol with improvement to 180s. -Continue with IV hydralazine 10 mg for systolic greater than [id number] -Resume home carvedilol, clonidine 0.3, hydralazine 25 3 times daily   DVT prophylaxis: Heparin Code Status: full Family Communication: None at bedside. Disposition Plan: ~ 1 - 2 days Consults called: neurology Admission status:  obs tele   Author: Bethena Roys, MD 04/06/2022 8:31 PM  For on call review [email or web address].

## 2022-04-06 NOTE — ED Notes (Signed)
When assessing bottom patient did not have any open sores noted.  Wound to left heel.

## 2022-04-06 NOTE — Plan of Care (Signed)
  Problem: Education: Goal: Knowledge of General Education information will improve Description Including pain rating scale, medication(s)/side effects and non-pharmacologic comfort measures Outcome: Progressing   Problem: Health Behavior/Discharge Planning: Goal: Ability to manage health-related needs will improve Outcome: Progressing   

## 2022-04-06 NOTE — Assessment & Plan Note (Signed)
1+ bilateral lower extremity edema, patient stable chronic and unchanged.  Recent echo 02/2022 EF of 60 to 65% severe LVH. -Volume status per HD

## 2022-04-06 NOTE — ED Notes (Signed)
ED TO INPATIENT HANDOFF REPORT  ED Nurse Name and Phone #: [phone]   S Name/Age/Gender [name]. 52 y.o. male Room/Bed: APA19/APA19  Code Status   Code Status: Prior  Home/SNF/Other Home Patient oriented to: self and place Is this baseline? No   Triage Complete: Triage complete  Chief Complaint Encephalopathy [G93.40]  Triage Note Patient was at dialysis and was not acting right so they sent him here.  Patient will moan and grubble but not really wanting to talk to anyone.  Did tell ems to call his wife.  CBG 87 Did not complete any of dialysis treatment and complains of butt pain.    Allergies Allergies  Allergen Reactions   Promethazine Diarrhea and Other (See Comments)    Muscle cramps   Promethazine Hcl Other (See Comments)    Cramping all over  Other reaction(s): cramp all over   Other     Other reaction(s): Unknown Other reaction(s): Unknown    Level of Care/Admitting Diagnosis ED Disposition     ED Disposition  Admit   Condition  --   [place]: [facility] [id number]  Level of Care: Telemetry [5]  Covid Evaluation: Asymptomatic - no recent exposure (last 10 days) testing not required  Diagnosis: Encephalopathy [id number]  Admitting Physician: Bethena Roys [id number]  Attending Physician: Bethena Roys Nessa.Cuff          B Medical/Surgery History Past Medical History:  Diagnosis Date   Anxiety    a.) on BZO (clonazepam) PRN   Aortic dilatation ([place]) 11/25/2020   a.) TTE 11/25/2020: Ao root measured 38 mm. b.) TTE 04/22/2021: Ao root measured 44 mm; c. 11/2021 Echo: Ao root 43m.   Atrial fibrillation ([place]    a.) CHA2DS2-VASc = 6 (CHF, HTN, CVA x2, prior MI, T2DM). b.) rate/rhythm maintained on oral amiodarone + carvedilol; chronically anticoagulated using dose reduced apixaban + clopidogrel   BPH (benign prostatic hyperplasia)    Cardiac arrest ([place] 07/26/2021   a.) in setting of hyperkalemia,  NSTEMI, and seizure following missed HD; pulseless and apneic --> required 1 round of CPR prior to ROSC   Cellulitis    Cerebral hemorrhage ([facility]    a. 10/2021 interventricular hemorrhage in setting of HTN emergency and DOAC/APT-->eliquis/plavix d/c'd.   Chronic diarrhea    Chronic heart failure with preserved ejection fraction (HFpEF) ([place] 07/19/2017   a. 06/2017 Echo: EF 75%; b. 11/2020 Echo: EF 55-60%; c. 04/2021 Echo: EF 55-60%; d. 11/2021 Echo: EF 65-70%, GrII DD, nl RV fxn, sev dil LA, large circumferential pericardial eff w/o tamponade, triv MR, AoV sclerosis. Ao root 482m   Chronic pain of both ankles    Coronary artery disease 02/26/2020   a. 02/2020 Ant STEMI/PCI: LM nl, LAD 9056m.5x30 Resolute Onyx), RI nl, LCX min irregs, RCA min irregs. EF 65%.   Diabetes mellitus, type II ([place]  Diabetic neuropathy ([facility]  Esophagitis    ESRD (end stage renal disease) on dialysis ([facility]  a.) [facility]; T-Th-Sat   Frequent falls    Gait instability    Gastritis    Gastroparesis    GERD (gastroesophageal reflux disease)    H/O enucleation of left eyeball    Heart palpitations    History of kidney stones    HLD (hyperlipidemia)    Hypertension    Insomnia    a.) uses melatonin PRN   Nausea and vomiting in adult    recurrent   NSTEMI (  non-ST elevated myocardial infarction) ([place]) 07/26/2021   a.) hyperkalemic from missed HD; seizures; decompensated to cardiac arrest and required CPR x 1 round prior to ROSC.   Osteomyelitis (HCC)    Pericardial effusion    a. 11/2021 Echo: EF 65-70%, GrII DD, nl RV fxn, sev dil LA, large circumferential pericardial eff w/o tamponade, triv MR, AoV sclerosis. Ao root 39m.   Seizure ([facility]    a.) last 07/26/2021 in setting of missed HD --> hyperkalemic at 6.5 --> pulseless/apneic and required CPR; discharged home on levetiracetam.   ST elevation myocardial infarction (STEMI) of anterior wall ([facility] 02/26/2020   a.) LHC/PCI 02/26/2020 --> EF 65%; LVEDP 11  mmHg; 90% mLAD (3.5 x 20 mm Resolute Onyx DES x 1)   Stroke ([place] 04/2021   a.) documented as x 3 between 04/2021 and 06/2021   Past Surgical History:  Procedure Laterality Date   A/V FISTULAGRAM Left 08/31/2021   Procedure: A/V Fistulagram;  Surgeon: DAlgernon Huxley MD;  [facility];  Service: Cardiovascular;  Laterality: Left;   A/V FISTULAGRAM Left 01/25/2022   Procedure: A/V Fistulagram;  Surgeon: DAlgernon Huxley MD;  [facility]: [facility];  Service: Cardiovascular;  Laterality: Left;   AMPUTATION Left 03/16/2016   Procedure: AMPUTATION DIGIT LEFT HALLUX;  Surgeon: MTrula Slade DPM;  Location: [facility]  Service: Podiatry;  Laterality: Left;  can start around 5    AMPUTATION Right 02/09/2021   Procedure: AMPUTATION DIGIT;  Surgeon: BSherilyn Cooter MD;  [facility]  Service: Orthopedics;  Laterality: Right;   AMPUTATION TOE Right 02/28/2022   Procedure: AMPUTATION TOE;  Surgeon: SLorenda Peck DPM;  [name];  Service: Podiatry;  Laterality: Right;  right fifth toe   ARTHROSCOPIC REPAIR ACL Left    AV FISTULA PLACEMENT Right 05/31/2019   Procedure: Brachiocephalic AV fistula creation;  Surgeon: DAlgernon Huxley MD;  Location: [name];  Service: Vascular;  Laterality: Right;   AV FISTULA PLACEMENT Left 08/13/2021   Procedure: ARTERIOVENOUS (AV) FISTULA CREATION (BRACHIALCEPHALIC);  Surgeon: DAlgernon Huxley MD;  Location: [name];  Service: Vascular;  Laterality: Left;   COLONOSCOPY WITH PROPOFOL N/A 10/28/2015   Procedure: COLONOSCOPY WITH PROPOFOL;  Surgeon: MLollie Sails MD;  [facility];  Service: Endoscopy;  Laterality: N/A;   COLONOSCOPY WITH PROPOFOL N/A 10/29/2015   Procedure: COLONOSCOPY WITH PROPOFOL;  Surgeon: MLollie Sails MD;  [facility]  Service: Endoscopy;  Laterality: N/A;   CORONARY/GRAFT ACUTE MI REVASCULARIZATION N/A 02/26/2020   Procedure: Coronary/Graft Acute MI Revascularization;  Surgeon:  VJettie Booze MD;  [facility];  Service: Cardiovascular;  Laterality: N/A;   DIALYSIS/PERMA CATHETER INSERTION Right 04/26/2019   Perm Cath    DIALYSIS/PERMA CATHETER INSERTION N/A 04/26/2019   Procedure: DIALYSIS/PERMA CATHETER INSERTION;  Surgeon: DAlgernon Huxley MD;  [facility];  Service: Cardiovascular;  Laterality: N/A;   DIALYSIS/PERMA CATHETER INSERTION N/A 05/25/2021   Procedure: DIALYSIS/PERMA CATHETER INSERTION;  Surgeon: DAlgernon Huxley MD;  [place];  Service: Cardiovascular;  Laterality: N/A;   DIALYSIS/PERMA CATHETER INSERTION N/A 08/31/2021   Procedure: DIALYSIS/PERMA CATHETER INSERTION;  Surgeon: DAlgernon Huxley MD;  [place];  Service: Cardiovascular;  Laterality: N/A;   DIALYSIS/PERMA CATHETER REMOVAL N/A 08/15/2019   Procedure: DIALYSIS/PERMA CATHETER REMOVAL;  Surgeon: SKatha Cabal MD;  [facility];  Service: Cardiovascular;  Laterality: N/A;   DIALYSIS/PERMA CATHETER  REMOVAL N/A 03/15/2022   Procedure: DIALYSIS/PERMA CATHETER REMOVAL;  Surgeon: Algernon Huxley, MD;  Location: [place] CV LAB;  Service: Cardiovascular;  Laterality: N/A;   EMBOLIZATION Right 02/06/2021   Procedure: EMBOLIZATION;  Surgeon: Algernon Huxley, MD;  Location: [place] [facility] LAB;  Service: Cardiovascular;  Laterality: Right;  Right Upper Extremity Dialysis Access, Permcath Placement   ESOPHAGOGASTRODUODENOSCOPY (EGD) WITH PROPOFOL N/A 12/27/2017   Procedure: ESOPHAGOGASTRODUODENOSCOPY (EGD) WITH PROPOFOL;  Surgeon: [place], Benay Pike, MD;  Location: [name];  Service: Gastroenterology;  Laterality: N/A;   EYE SURGERY     INTRAVASCULAR ULTRASOUND/IVUS N/A 02/26/2020   Procedure: Intravascular Ultrasound/IVUS;  Surgeon: Jettie Booze, MD;  Location: [place] [facility];  Service: Cardiovascular;  Laterality: N/A;   LEFT HEART CATH AND CORONARY ANGIOGRAPHY N/A 02/26/2020   Procedure:  LEFT HEART CATH AND CORONARY ANGIOGRAPHY;  Surgeon: Jettie Booze, MD;  Location: [place] CV LAB;  Service: Cardiovascular;  Laterality: N/A;   PROSTATE SURGERY  2016   TONSILECTOMY/ADENOIDECTOMY WITH MYRINGOTOMY     TONSILLECTOMY     UPPER EXTREMITY ANGIOGRAPHY Right 11/26/2020   Procedure: Upper Extremity Angiography;  Surgeon: Algernon Huxley, MD;  Location: [place] CV LAB;  Service: Cardiovascular;  Laterality: Right;   UPPER EXTREMITY ANGIOGRAPHY Right 02/05/2021   Procedure: UPPER EXTREMITY ANGIOGRAPHY;  Surgeon: Algernon Huxley, MD;  Location: [place] CV LAB;  Service: Cardiovascular;  Laterality: Right;     A IV Location/Drains/Wounds Patient Lines/Drains/Airways Status     Active Line/Drains/Airways     Name Placement date Placement time Site Days   Peripheral IV 04/06/22 20 G Right;Posterior Hand 04/06/22  0750  Hand  less than 1   Fistula / Graft Left Upper arm --  --  Upper arm  --   Pressure Injury 11/28/21 Scrotum Medial Stage 2 -  Partial thickness loss of dermis presenting as a shallow open injury with a red, pink wound bed without slough. round open red area 11/28/21  2200  -- 129   Pressure Injury 11/28/21 Buttocks Left Stage 2 -  Partial thickness loss of dermis presenting as a shallow open injury with a red, pink wound bed without slough. blister 11/28/21  2200  -- 129   Wound / Incision (Open or Dehisced) 04/06/22 Diabetic ulcer Heel Left;Posterior 04/06/22  1034  Heel  less than 1            Intake/Output Last 24 hours  Intake/Output Summary (Last 24 hours) at 04/06/2022 1733 Last data filed at 04/06/2022 1450 Gross per 24 hour  Intake --  Output 2000 ml  Net -2000 ml    Labs/Imaging Results for orders placed or performed during the hospital encounter of 04/06/22 (from the past 48 hour(s))  CBG monitoring, ED     Status: None   Collection Time: 04/06/22  7:45 AM  Result Value Ref Range   Glucose-Capillary 82 70 - 99 mg/dL    Comment:  Glucose reference range applies only to samples taken after fasting for at least 8 hours.  Comprehensive metabolic panel     Status: Abnormal   Collection Time: 04/06/22  7:51 AM  Result Value Ref Range   Sodium 143 135 - 145 mmol/L   Potassium 6.1 (H) 3.5 - 5.1 mmol/L   Chloride 114 (H) 98 - 111 mmol/L   CO2 17 (L) 22 - 32 mmol/L   Glucose, Bld 78 70 - 99 mg/dL    Comment: Glucose reference range applies only to  samples taken after fasting for at least 8 hours.   BUN 72 (H) 6 - 20 mg/dL   Creatinine, Ser 10.66 (H) 0.61 - 1.24 mg/dL   Calcium 8.5 (L) 8.9 - 10.3 mg/dL   Total Protein 6.2 (L) 6.5 - 8.1 g/dL   Albumin 3.3 (L) 3.5 - 5.0 g/dL   AST 21 15 - 41 U/L   ALT 25 0 - 44 U/L   Alkaline Phosphatase 96 38 - 126 U/L   Total Bilirubin 0.6 0.3 - 1.2 mg/dL   GFR, Estimated 5 (L) >60 mL/min    Comment: (NOTE) Calculated using the CKD-EPI Creatinine Equation (2021)    Anion gap 12 5 - 15    Comment: Performed at [facility], [address] [place], [place] [address]  CBC     Status: Abnormal   Collection Time: 04/06/22  7:51 AM  Result Value Ref Range   WBC 6.4 4.0 - 10.5 K/uL   RBC 4.54 4.22 - 5.81 MIL/uL   Hemoglobin 10.0 (L) 13.0 - 17.0 g/dL   HCT 33.3 (L) 39.0 - 52.0 %   MCV 73.3 (L) 80.0 - 100.0 fL   MCH 22.0 (L) 26.0 - 34.0 pg   MCHC 30.0 30.0 - 36.0 g/dL   RDW 25.9 (H) 11.5 - 15.5 %   Platelets 161 150 - 400 K/uL   nRBC 2.3 (H) 0.0 - 0.2 %    Comment: Performed at [facility], [address], [place], [place] [address]  Troponin I (High Sensitivity)     Status: Abnormal   Collection Time: 04/06/22  7:51 AM  Result Value Ref Range   Troponin I (High Sensitivity) 29 (H) <18 ng/L    Comment: (NOTE) Elevated high sensitivity troponin I (hsTnI) values and significant  changes across serial measurements may suggest ACS but many other  chronic and acute conditions are known to elevate hsTnI results.  Refer to the "Links" section for chest pain algorithms and additional   guidance. Performed at [facility], [address], [place], [place] [address]   Ethanol     Status: None   Collection Time: 04/06/22  7:51 AM  Result Value Ref Range   Alcohol, Ethyl (B) <10 <10 mg/dL    Comment: (NOTE) Lowest detectable limit for serum alcohol is 10 mg/dL.  For medical purposes only. Performed at [name], [address], [place], [place] [address]   Resp panel by RT-PCR (RSV, Flu A&B, Covid) Anterior Nasal Swab     Status: None   Collection Time: 04/06/22  8:45 AM   Specimen: Anterior Nasal Swab  Result Value Ref Range   SARS Coronavirus 2 by RT PCR NEGATIVE NEGATIVE    Comment: (NOTE) SARS-CoV-2 target nucleic acids are NOT DETECTED.  The SARS-CoV-2 RNA is generally detectable in upper respiratory specimens during the acute phase of infection. The lowest concentration of SARS-CoV-2 viral copies this assay can detect is 138 copies/mL. A negative result does not preclude SARS-Cov-2 infection and should not be used as the sole basis for treatment or other patient management decisions. A negative result may occur with  improper specimen collection/handling, submission of specimen other than nasopharyngeal swab, presence of viral mutation(s) within the areas targeted by this assay, and inadequate number of viral copies(<138 copies/mL). A negative result must be combined with clinical observations, patient history, and epidemiological information. The expected result is Negative.  Fact Sheet for Patients:  EntrepreneurPulse.com.au  Fact Sheet for Healthcare Providers:  IncredibleEmployment.be  This test is  no t yet approved or cleared by the [place] and  has been authorized for detection and/or diagnosis of SARS-CoV-2 by FDA under an Emergency Use Authorization (EUA). This EUA will remain  in effect (meaning this test can be used) for the duration of the COVID-19 declaration under Section 564(b)(1) of the  Act, 21 U.S.C.section 360bbb-3(b)(1), unless the authorization is terminated  or revoked sooner.       Influenza A by PCR NEGATIVE NEGATIVE   Influenza B by PCR NEGATIVE NEGATIVE    Comment: (NOTE) The Xpert Xpress SARS-CoV-2/FLU/RSV plus assay is intended as an aid in the diagnosis of influenza from Nasopharyngeal swab specimens and should not be used as a sole basis for treatment. Nasal washings and aspirates are unacceptable for Xpert Xpress SARS-CoV-2/FLU/RSV testing.  Fact Sheet for Patients: EntrepreneurPulse.com.au  Fact Sheet for Healthcare Providers: IncredibleEmployment.be  This test is not yet approved or cleared by the [place] FDA and has been authorized for detection and/or diagnosis of SARS-CoV-2 by FDA under an Emergency Use Authorization (EUA). This EUA will remain in effect (meaning this test can be used) for the duration of the COVID-19 declaration under Section 564(b)(1) of the Act, 21 U.S.C. section 360bbb-3(b)(1), unless the authorization is terminated or revoked.     Resp Syncytial Virus by PCR NEGATIVE NEGATIVE    Comment: (NOTE) Fact Sheet for Patients: EntrepreneurPulse.com.au  Fact Sheet for Healthcare Providers: IncredibleEmployment.be  This test is not yet approved or cleared by the [place] FDA and has been authorized for detection and/or diagnosis of SARS-CoV-2 by FDA under an Emergency Use Authorization (EUA). This EUA will remain in effect (meaning this test can be used) for the duration of the COVID-19 declaration under Section 564(b)(1) of the Act, 21 U.S.C. section 360bbb-3(b)(1), unless the authorization is terminated or revoked.  Performed at [facility], [address], [place], [place] [address]   Troponin I (High Sensitivity)     Status: Abnormal   Collection Time: 04/06/22  9:56 AM  Result Value Ref Range   Troponin I (High Sensitivity) 29 (H)  <18 ng/L    Comment: (NOTE) Elevated high sensitivity troponin I (hsTnI) values and significant  changes across serial measurements may suggest ACS but many other  chronic and acute conditions are known to elevate hsTnI results.  Refer to the "Links" section for chest pain algorithms and additional  guidance. Performed at [facility], [address] [place], [place] [address]   Hepatitis B surface antigen     Status: None   Collection Time: 04/06/22 10:42 AM  Result Value Ref Range   Hepatitis B Surface Ag NON REACTIVE NON REACTIVE    Comment: Performed at [facility] [address] [place], [place] [address]   DG Chest Port 1 View  Result Date: 04/06/2022 CLINICAL DATA:  SOB EXAM: PORTABLE CHEST 1 VIEW COMPARISON:  03/01/2022 FINDINGS: Cardiac silhouette appears prominent. No pneumonia or pulmonary edema. No pneumothorax or pleural effusion. IMPRESSION: Enlarged cardiac silhouette. Electronically Signed   By: Sammie Bench M.D.   On: 04/06/2022 08:47   CT Head Wo Contrast  Result Date: 04/06/2022 CLINICAL DATA:  Mental status change, unknown cause. EXAM: CT HEAD WITHOUT CONTRAST TECHNIQUE: Contiguous axial images were obtained from the base of the skull through the vertex without intravenous contrast. RADIATION DOSE REDUCTION: This exam was performed according to the departmental dose-optimization program which includes automated exposure control, adjustment of the mA and/or kV according to patient size and/or use  of iterative reconstruction technique. COMPARISON:  Head CT 03/04/2022. FINDINGS: Brain: No acute intracranial hemorrhage. 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 Mild chronic small-vessel disease. No hydrocephalus or extra-axial collection. Vascular: No hyperdense vessel. Skull: No calvarial fracture or  suspicious bone lesion. Skull base is unremarkable. Sinuses/Orbits: Mastoids and paranasal sinuses are well aerated. Partial left globe prosthesis. Other: Extensive calcifications of the scalp vessels. IMPRESSION: 1. No acute intracranial abnormality. 2. 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 Electronically Signed   By: Emmit Alexanders M.D.   On: 04/06/2022 08:42    Pending Labs Unresulted Labs (From admission, onward)     Start     Ordered   04/06/22 1022  Hepatitis B surface antibody,quantitative  (New Admission Hemo Labs (Hepatitis B))  Once,   URGENT        04/06/22 1022            Vitals/Pain Today's Vitals   04/06/22 1550 04/06/22 1600 04/06/22 1615 04/06/22 1630  BP: (!) 204/80 (!) 212/89 (!) 201/84 (!) 187/84  Pulse: 78 78 71 72  Resp: 16 14 13 11  $ Temp:      TempSrc:      SpO2: 93% 96% 98% 98%  Weight:      Height:      PainSc:        Isolation Precautions Airborne and Contact precautions  Medications Medications  pentafluoroprop-tetrafluoroeth (GEBAUERS) aerosol 1 Application (has no administration in time range)  lidocaine (PF) (XYLOCAINE) 1 % injection 5 mL (has no administration in time range)  lidocaine-prilocaine (EMLA) cream 1 Application (has no administration in time range)  sodium zirconium cyclosilicate (LOKELMA) packet 10 g (10 g Oral Given 04/06/22 0936)  albuterol (PROVENTIL) (2.5 MG/3ML) 0.083% nebulizer solution 10 mg (10 mg Nebulization Given 04/06/22 0939)  labetalol (NORMODYNE) injection 20 mg (20 mg Intravenous Given 04/06/22 1607)    Mobility walks with person assist     Focused Assessments Neuro Assessment Handoff:  Swallow screen pass? Yes  Cardiac Rhythm: Normal sinus rhythm NIH Stroke Scale  Dizziness Present: No Headache Present: No Interval: Initial Level of  Consciousness (1a.)   : Not alert, but arousable by minor stimulation to obey, answer, or respond LOC Questions (1b. )   : Answers one question correctly LOC Commands (1c. )   : Performs both tasks correctly Best Gaze (2. )  : Normal Visual (3. )  : No visual loss Facial Palsy (4. )    : Normal symmetrical movements Motor Arm, Left (5a. )   : Some effort against gravity Motor Arm, Right (5b. ) : No drift Motor Leg, Left (6a. )  : Some effort against gravity Motor Leg, Right (6b. ) : No drift Limb Ataxia (7. ): Absent Sensory (8. )  : Normal, no sensory loss Best Language (9. )  : Mild-to-moderate aphasia Dysarthria (10. ): Mild-to-moderate dysarthria, patient slurs at least some words and, at worst, can be understood with some difficulty Extinction/Inattention (11.)   : No Abnormality Complete NIHSS TOTAL: 8     Neuro Assessment: Exceptions to WDL Neuro Checks:   Initial (04/06/22 0806)  Has TPA been given? No If patient is a Neuro Trauma and patient is going to OR before floor call report to [place] nurse: [phone] or  [phone]   R Recommendations: See Admitting Provider Note  Report given to:   Additional Notes:

## 2022-04-06 NOTE — ED Notes (Signed)
Transported to dialysis.

## 2022-04-06 NOTE — Assessment & Plan Note (Signed)
Not on medication. - CBG BID

## 2022-04-06 NOTE — ED Notes (Signed)
Patient to return from dialysis around 1600-1630

## 2022-04-06 NOTE — Assessment & Plan Note (Signed)
Completed 2 hours 45 minutes of initial plan to 3-1/2 hours of dialysis.  Potassium 6.1.  BUN 78. [name] given in ED -BMP in a.m. -Resume renal meds

## 2022-04-06 NOTE — Progress Notes (Addendum)
  HEMODIALYSIS TREATMENT NOTE:  "Get me Leretha Dykes here!"  Reluctantly consents to dialysis.  3.5 hour heparin-free treatment was initiated without problems.  Minimally interactive. Confirms usual run time is 3.5 hours.  Answers yes/no questions appropriately.  Treatment was paused 2 hours and 45 minutes in due to need for cartridge exchange (wet transducer).  He grew impatient while waiting for [place] to re-prime and declared he would not resume therapy.  Potentially life-threatening complications of hyperkalemia were explained.  Pt nodded affirmatively and stated, "I've got to get out of here now. I'll sign the [facility] deleted] paper!"  He made his mark on an [place] waiver (witnessed by Benay Pillow RN), Dr. Joelyn Oms was notified, and AVF needles were removed.  Total run time: 2 hours 45 minutes Liters processed:  50.7 Net UF removed:  2 liters. No blood loss.  POST-HD:  04/06/22 1523  Vital Signs  Temp 98 F (36.7 C)  Temp Source Oral  Pulse Rate 76  Pulse Rate Source Monitor  Resp 16  BP (!) 169/76  BP Location Right Arm  BP Method Automatic  Patient Position (if appropriate) Lying  Oxygen Therapy  SpO2 100 %  O2 Device Room Air    Pt was transported back to ED to await disposition.  Hand-off was given to Eulogio Ditch, RN.   Rockwell [place], RN  AP [facility]

## 2022-04-06 NOTE — Assessment & Plan Note (Signed)
Blood pressure up to 99991111 systolic.  Given 20 mg of labetalol with improvement to 180s. -Continue with IV hydralazine 10 mg for systolic greater than [id number] -Resume home carvedilol, clonidine 0.3, hydralazine 25 3 times daily

## 2022-04-06 NOTE — ED Notes (Signed)
Phleb confirmed they drew the repeat troponin

## 2022-04-06 NOTE — Assessment & Plan Note (Addendum)
Encephalopathy type as yet unspecified.  Currently appears to have improved.  Differentials include hypertensive encephalopathy, ? encephalomalacia as etiology.  Afebrile without leukocytosis.  Blood pressure as high as 99991111 systolic.  Symptoms ongoing over the past few months. 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 -Recent MRI 03/01/2022 without acute abnormality, shows similar remote bifrontal cortical infarcts, prior hemorrhage. -IV hydralazine 10 mg as needed. -Will consult neurology of seizures, and if further brain imaging is needed

## 2022-04-06 NOTE — ED Notes (Signed)
Patient returned from dialysis and is more confused and hypertensive.  MD wants to admit patient.  Patient refusing to answer questions.

## 2022-04-07 DIAGNOSIS — N186 End stage renal disease: Secondary | ICD-10-CM | POA: Diagnosis not present

## 2022-04-07 DIAGNOSIS — E1165 Type 2 diabetes mellitus with hyperglycemia: Secondary | ICD-10-CM | POA: Diagnosis not present

## 2022-04-07 DIAGNOSIS — G9340 Encephalopathy, unspecified: Secondary | ICD-10-CM | POA: Diagnosis not present

## 2022-04-07 DIAGNOSIS — I160 Hypertensive urgency: Secondary | ICD-10-CM | POA: Diagnosis not present

## 2022-04-07 LAB — BASIC METABOLIC PANEL
Anion gap: 11 (ref 5–15)
BUN: 61 mg/dL — ABNORMAL HIGH (ref 6–20)
CO2: 21 mmol/L — ABNORMAL LOW (ref 22–32)
Calcium: 8.2 mg/dL — ABNORMAL LOW (ref 8.9–10.3)
Chloride: 106 mmol/L (ref 98–111)
Creatinine, Ser: 8.89 mg/dL — ABNORMAL HIGH (ref 0.61–1.24)
GFR, Estimated: 6 mL/min — ABNORMAL LOW (ref >60)
Glucose, Bld: 100 mg/dL — ABNORMAL HIGH (ref 70–99)
Potassium: 5.2 mmol/L — ABNORMAL HIGH (ref 3.5–5.1)
Sodium: 138 mmol/L (ref 135–145)

## 2022-04-07 LAB — CBC
HCT: 34.4 % — ABNORMAL LOW (ref 39.0–52.0)
Hemoglobin: 10.4 g/dL — ABNORMAL LOW (ref 13.0–17.0)
MCH: 22.2 pg — ABNORMAL LOW (ref 26.0–34.0)
MCHC: 30.2 g/dL (ref 30.0–36.0)
MCV: 73.5 fL — ABNORMAL LOW (ref 80.0–100.0)
Platelets: 151 10*3/uL (ref 150–400)
RBC: 4.68 MIL/uL (ref 4.22–5.81)
RDW: 26.4 % — ABNORMAL HIGH (ref 11.5–15.5)
WBC: 5.5 10*3/uL (ref 4.0–10.5)
nRBC: 1.8 % — ABNORMAL HIGH (ref 0.0–0.2)

## 2022-04-07 LAB — TSH: TSH: 6.017 u[IU]/mL — ABNORMAL HIGH (ref 0.350–4.500)

## 2022-04-07 LAB — HEPATITIS B SURFACE ANTIBODY, QUANTITATIVE: Hep B S AB Quant (Post): 45.8 m[IU]/mL (ref >9.9)

## 2022-04-07 LAB — T4, FREE: Free T4: 0.85 ng/dL (ref 0.61–1.12)

## 2022-04-07 LAB — GLUCOSE, CAPILLARY: Glucose-Capillary: 80 mg/dL (ref 70–99)

## 2022-04-07 MED ORDER — CINACALCET HCL 30 MG PO TABS
30.0000 mg | ORAL_TABLET | ORAL | Status: DC
  Administered 2022-04-08: 30 mg via ORAL
  Filled 2022-04-07: qty 1

## 2022-04-07 MED ORDER — LEVETIRACETAM 500 MG PO TABS: 500.0000 mg | ORAL_TABLET | Freq: Every day | ORAL | Status: DC

## 2022-04-07 MED ORDER — LEVETIRACETAM 500 MG PO TABS
750.0000 mg | ORAL_TABLET | Freq: Every day | ORAL | Status: DC
  Administered 2022-04-07: 750 mg via ORAL
  Filled 2022-04-07: qty 1

## 2022-04-07 MED ORDER — SODIUM ZIRCONIUM CYCLOSILICATE 10 G PO PACK
10.0000 g | PACK | Freq: Three times a day (TID) | ORAL | Status: AC
  Administered 2022-04-07 (×3): 10 g via ORAL
  Filled 2022-04-07 (×3): qty 1

## 2022-04-07 MED ORDER — LEVETIRACETAM 250 MG PO TABS
250.0000 mg | ORAL_TABLET | ORAL | Status: DC
  Administered 2022-04-08: 250 mg via ORAL
  Filled 2022-04-07: qty 1

## 2022-04-07 MED ORDER — CALCITRIOL 0.25 MCG PO CAPS
1.5000 ug | ORAL_CAPSULE | ORAL | Status: DC
  Filled 2022-04-07: qty 6

## 2022-04-07 NOTE — Progress Notes (Signed)
Mobility Specialist Progress Note:    04/07/22 1102  Mobility  Activity Ambulated with assistance to bathroom (*from bathroom)  Level of Assistance Moderate assist, patient does 50-74%  Assistive Device Front wheel walker  Distance Ambulated (ft) 10 ft  Activity Response Tolerated well  Mobility Referral Yes  $Mobility charge 1 Mobility   Pt was received on toilet in bathroom, requesting assistance to return to bed. Required ModA to stand, MinG during ambulation. Pt had limited strength/control of left hand when cued to use bathroom handrails for support. Tolerated ambulation well, returned pt to bed. Needed assistance with returning to supine position. Added a thick blanket between left bed rail and pt's shoulder to help stay upright. Left pt in bed in company of his Doristine Bosworth, all needs met.   Royetta Crochet Mobility Specialist Please contact via Solicitor or  Rehab office at [phone]

## 2022-04-07 NOTE — Consult Note (Signed)
I connected with  [name]. on 04/07/22 by a video enabled telemedicine application and verified that I am speaking with the correct person using two identifiers.   I discussed the limitations of evaluation and management by telemedicine. The patient expressed understanding and agreed to proceed.  Location of patient: [facility] Location of physician: [facility]  Neurology Consultation Reason for Consult: Altered mental status Referring Physician: Dr Jenetta Downer  CC: Altered mental status  History is obtained from: Patient, chart review  HPI: [name]. is a 52 y.o. male with past medical history of atrial fibrillation, intracerebral hemorrhage, hypertension, hyperlipidemia, diabetes, end-stage renal disease on hemodialysis, seizures on Keppra who presented with altered mental status.   Patient states he missed dialysis on Saturday. While at dialysis on Tuesday he felt "unwell" and therefore came to ER. On arrival his BP was 189/74 mm hg. He got HD and is significantly better today. I called patient's wife [name] who reported he had some head jerks on Monday intermittently, awake during jerking. On Tuesday during dialysis, he has tremor like movement of bilateral upper extremities. Denies any GTC seizure like movement. States he has not been taking any 29m keppra but is taking 7521mBID. Denies recent illness. Wife does report he has been excessively tired. Patient states he sleeps most day.   ROS: All other systems reviewed and negative except as noted in the HPI.  Past Medical History:  Diagnosis Date   Anxiety    a.) on BZO (clonazepam) PRN   Aortic dilatation (HCS[place]10/05/2020   a.) TTE 11/25/2020: Ao root measured 38 mm. b.) TTE 04/22/2021: Ao root measured 44 mm; c. 11/2021 Echo: Ao root 4026m  Atrial fibrillation ([place]  a.) CHA2DS2-VASc = 6 (CHF, HTN, CVA x2, prior MI, T2DM). b.) rate/rhythm maintained on oral amiodarone +  carvedilol; chronically anticoagulated using dose reduced apixaban + clopidogrel   BPH (benign prostatic hyperplasia)    Cardiac arrest ([place]CC[place]6/05/2021   a.) in setting of hyperkalemia, NSTEMI, and seizure following missed HD; pulseless and apneic --> required 1 round of CPR prior to ROSC   Cellulitis    Cerebral hemorrhage ([facility]CC[place]  a. 10/2021 interventricular hemorrhage in setting of HTN emergency and DOAC/APT-->eliquis/plavix d/c'd.   Chronic diarrhea    Chronic heart failure with preserved ejection fraction (HFpEF) ([place]CC[place]5/28/2019   a. 06/2017 Echo: EF 75%; b. 11/2020 Echo: EF 55-60%; c. 04/2021 Echo: EF 55-60%; d. 11/2021 Echo: EF 65-70%, GrII DD, nl RV fxn, sev dil LA, large circumferential pericardial eff w/o tamponade, triv MR, AoV sclerosis. Ao root 69m57m Chronic pain of both ankles    Coronary artery disease 02/26/2020   a. 02/2020 Ant STEMI/PCI: LM nl, LAD 41m 46mx30 Resolute Onyx), RI nl, LCX min irregs, RCA min irregs. EF 65%.   Diabetes mellitus, type II (HCC) [place]abetic neuropathy (HCC) [place]    ESRD (end stage renal disease) on dialysis (HCC) [place] [facility]; T-Th-Sat   Frequent falls    Gait instability    Gastritis    Gastroparesis    GERD (gastroesophageal reflux disease)    H/O enucleation of left eyeball    Heart palpitations    History of kidney stones    HLD (hyperlipidemia)    Hypertension    Insomnia    a.) uses melatonin PRN   Nausea and vomiting in adult    recurrent   NSTEMI (  non-ST elevated myocardial infarction) ([place]) 07/26/2021   a.) hyperkalemic from missed HD; seizures; decompensated to cardiac arrest and required CPR x 1 round prior to ROSC.   Osteomyelitis (HCC)    Pericardial effusion    a. 11/2021 Echo: EF 65-70%, GrII DD, nl RV fxn, sev dil LA, large circumferential pericardial eff w/o tamponade, triv MR, AoV sclerosis. Ao root 3m.   Seizure (HImmok[place]    a.) last 07/26/2021 in setting of missed HD --> hyperkalemic at 6.5 -->  pulseless/apneic and required CPR; discharged home on levetiracetam.   ST elevation myocardial infarction (STEMI) of anterior wall ([place] 02/26/2020   a.) LHC/PCI 02/26/2020 --> EF 65%; LVEDP 11 mmHg; 90% mLAD (3.5 x 20 mm Resolute Onyx DES x 1)   Stroke (HBer[place] 04/2021   a.) documented as x 3 between 04/2021 and 06/2021    Family History  Problem Relation Age of Onset   CAD Father    Stroke Father    Diabetes Mellitus II Mother    Kidney failure Mother    Schizophrenia Mother     Social History:  reports that he has never smoked. He has never used smokeless tobacco. He reports that he does not drink alcohol and does not use drugs.   Medications Prior to Admission  Medication Sig Dispense Refill Last Dose   acetaminophen (TYLENOL) 325 MG tablet Take 1-2 tablets (325-650 mg total) by mouth every 4 (four) hours as needed for mild pain.   unknown   amiodarone (PACERONE) 200 MG tablet Take 1 tablet (200 mg total) by mouth every morning. 90 tablet 3 04/06/2022   amLODipine (NORVASC) 10 MG tablet Take 1 tablet (10 mg total) by mouth daily. (Patient taking differently: Take 5 mg by mouth daily.) 30 tablet 2 04/06/2022   atorvastatin (LIPITOR) 80 MG tablet Take 1 tablet (80 mg total) by mouth daily. 30 tablet 0 04/05/2022   calcium acetate (PHOSLO) 667 MG capsule Take 2 capsules (1,334 mg total) by mouth 3 (three) times daily with meals. 180 capsule 0 04/05/2022   carvedilol (COREG) 25 MG tablet Take 25 mg by mouth 2 (two) times daily.   04/06/2022 at 0500   cloNIDine (CATAPRES) 0.3 MG tablet Take 1 tablet (0.3 mg total) by mouth every 8 (eight) hours. 90 tablet 2 unknown   diphenoxylate-atropine (LOMOTIL) 2.5-0.025 MG tablet Take as needed for diarrhea. Can take up to 4 times daily.   unknown   dorzolamide-timolol (COSOPT) 22.3-6.8 MG/ML ophthalmic solution Place 1 drop into the right eye 2 (two) times daily.   04/05/2022   levETIRAcetam (KEPPRA) 750 MG tablet Take 1 tablet (750 mg total) by mouth 2  (two) times daily. (Patient taking differently: Take 750 mg by mouth 2 (two) times daily. Twice daily Only on dialysis days and once on all other days) 60 tablet 2 04/06/2022   lidocaine-prilocaine (EMLA) cream Apply topically.   unknown   nitroGLYCERIN (NITROSTAT) 0.4 MG SL tablet Place 1 tablet (0.4 mg total) under the tongue every 5 (five) minutes as needed for chest pain. 25 tablet 6 unknown   hydrALAZINE (APRESOLINE) 25 MG tablet Take 25 mg by mouth 3 (three) times daily.      irbesartan (AVAPRO) 300 MG tablet Take 1 tablet (300 mg total) by mouth daily. (Patient not taking: Reported on 04/06/2022) 30 tablet 2 Not Taking   levETIRAcetam (KEPPRA) 250 MG tablet Take 1 tablet (250 mg total) by mouth Every Tuesday,Thursday,and Saturday with dialysis. (Patient not taking: Reported on 04/06/2022) 15 tablet  1 Not Taking   Wheat Dextrin (BENEFIBER) POWD 2 teaspoon in 6-8 ounces of water Orally twice a day for 30 days (Patient not taking: Reported on 04/06/2022)   Not Taking      Exam: Current vital signs: BP (!) 166/74 (BP Location: Right Arm)   Pulse 70   Temp 97.8 F (36.6 C)   Resp 18   Ht 6' 3"$  (1.905 m)   Wt 85.3 kg   SpO2 100%   BMI 23.50 kg/m  Vital signs in last 24 hours: Temp:  [97.8 F (36.6 C)-98.3 F (36.8 C)] 97.8 F (36.6 C) (02/14 0427) Pulse Rate:  [66-86] 70 (02/14 0926) Resp:  [7-19] 18 (02/14 0926) BP: (116-212)/(59-93) 166/74 (02/14 0926) SpO2:  [93 %-100 %] 100 % (02/14 0926) Weight:  [85.3 kg-87.2 kg] 85.3 kg (02/13 1523)   Physical Exam  Constitutional: Appears well-developed and well-nourished.  Psych: Affect appropriate to situation Eyes: No scleral injection Neuro: Aox3, no aphasia, CN 2-12 grossly intact, antigravity strength in all extremities, fine tremor in BL UP extremities when outstretched ( action tremor)  I have reviewed labs in epic and the results pertinent to this consultation are: CBC:  Recent Labs  Lab 04/06/22 0751 04/07/22 0449  WBC 6.4  5.5  HGB 10.0* 10.4*  HCT 33.3* 34.4*  MCV 73.3* 73.5*  PLT 161 [id number]    Basic Metabolic Panel:  Lab Results  Component Value Date   NA 138 04/07/2022   K 5.2 (H) 04/07/2022   CO2 21 (L) 04/07/2022   GLUCOSE 100 (H) 04/07/2022   BUN 61 (H) 04/07/2022   CREATININE 8.89 (H) 04/07/2022   CALCIUM 8.2 (L) 04/07/2022   GFRNONAA 6 (L) 04/07/2022   GFRAA 5 (L) 08/27/2019   Lipid Panel:  Lab Results  Component Value Date   LDLCALC 34 11/21/2021   HgbA1c:  Lab Results  Component Value Date   HGBA1C 4.5 (L) 11/21/2021   Urine Drug Screen:     Component Value Date/Time   LABOPIA NONE DETECTED 10/14/2017 1800   COCAINSCRNUR NONE DETECTED 10/14/2017 1800   COCAINSCRNUR NONE DETECTED 07/18/2017 1652   LABBENZ NONE DETECTED 10/14/2017 1800   AMPHETMU NONE DETECTED 10/14/2017 1800   THCU NONE DETECTED 10/14/2017 1800   LABBARB NONE DETECTED 10/14/2017 1800    Alcohol Level     Component Value Date/Time   ETH <10 04/06/2022 0751    I have reviewed the images obtained:  CT Head without contrast 04/06/2022:  No acute intracranial abnormality.  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   ASSESSMENT/PLAN: 52yo M with ESRD on HD TTS, seizures on keppra presented with ams in setting of uremia ( missed dialysis) and HTN urgency.  ESRD  Seizures HTN urgency Tremor like movement Acute encephalopathy, resolved - Encephalopathy likely due to uremia and HTN urgency. Could have had PRES but now back to baseline so will defer mri - Tremor like movement could be due to myoclonus sue to uremia, dialysis dysequilibrium syndrome. Less likely epileptic due to semiology  Recommendations - Reduce Keppra to 537m QAM and continue 750 mg QHS, additionally 255mafter dialysis on TTS to adjust for renal disease - Discussed with wife to take  video next time he is jerking - Will also check TSH levels due to action tremor - discussed importance of BP control and dialysis - continue seizure precautions including DO NOT drive -  discussed plan with patient at bedside, wife on phone and Dr Wynetta Emery via secure chat - F/u with Dr April Manson at [facility] neuro in 3 months. Specifically discussed with wife the importance of this appointment, She is agreeable to drive him   Thank you for allowing [place] to participate in the care of this patient. If you have any further questions, please contact  me or neurohospitalist.   Zeb Comfort Epilepsy [facility] neurohospitalist

## 2022-04-07 NOTE — Plan of Care (Signed)
  Problem: Education: Goal: Knowledge of General Education information will improve Description Including pain rating scale, medication(s)/side effects and non-pharmacologic comfort measures Outcome: Progressing   Problem: Health Behavior/Discharge Planning: Goal: Ability to manage health-related needs will improve Outcome: Progressing   

## 2022-04-07 NOTE — Progress Notes (Signed)
[facility] Progress Note    Assessment/ Plan:   ESRD -outpatient orders: [facility].  TTS.  3.5 hours.  EDW 6 kg.  Flow rates 350/600.  2K/2.5 calcium.  Meds: Calcitriol 1.5 mcg q. treatment, Sensipar 30 mg q. treatment, Venofer 50 mg q. weekly, Mircera 200 mcg every 2 weeks (due 04/06/2022) -HD on TTS schedule, HD tomorrow  AMS -w/u and mgmt per primary service  Hyperkalemia -improved with short run of HD yesterday, K down to 5.2. Agree with lokelma for today. Low K diet  HTN urgency/Volume -BP better. Will UF as tolerated. Can increase hydralazine if needed  AOCD -hgb at goal. Transfuse PRN. Will resume his ESA if/when indicated  Secondary hyperparathyroidism -c/w phoslo. Low phos diet/renal diet -will resume his calcitriol and sensipar  Gean Quint, MD [facility]  Subjective:   Did only 2 hrs 45 min of HD yesterday, was agitated, signed AMA waiver before Tablo machine could be re-primed. Patient seen and examined bedside this AM. No acute events otherwise. Calm this AM. No complaints   Objective:   BP (!) 173/74 (BP Location: Right Arm)   Pulse 66   Temp 97.8 F (36.6 C)   Resp 18   Ht 6' 3"$  (1.905 m)   Wt 85.3 kg   SpO2 96%   BMI 23.50 kg/m   Intake/Output Summary (Last 24 hours) at 04/07/2022 [id number] Last data filed at 04/06/2022 1450 Gross per 24 hour  Intake --  Output 2000 ml  Net -2000 ml   Weight change:   Physical Exam: Gen: NAD, laying flat in bed CVS: RRR Resp: CTA B/L Abd: nondistended Ext: no sig edema b/l Les Neuro: awake, alert, calm Dialysis access: LUE AVF +b/t  Imaging: DG Chest Port 1 View  Result Date: 04/06/2022 CLINICAL DATA:  SOB EXAM: PORTABLE CHEST 1 VIEW COMPARISON:  03/01/2022 FINDINGS: Cardiac silhouette appears prominent. No pneumonia or pulmonary edema. No pneumothorax or pleural effusion. IMPRESSION: Enlarged cardiac silhouette. Electronically Signed   By: Sammie Bench M.D.   On:  04/06/2022 08:47   CT Head Wo Contrast  Result Date: 04/06/2022 CLINICAL DATA:  Mental status change, unknown cause. EXAM: CT HEAD WITHOUT CONTRAST TECHNIQUE: Contiguous axial images were obtained from the base of the skull through the vertex without intravenous contrast. RADIATION DOSE REDUCTION: This exam was performed according to the departmental dose-optimization program which includes automated exposure control, adjustment of the mA and/or kV according to patient size and/or use of iterative reconstruction technique. COMPARISON:  Head CT 03/04/2022. FINDINGS: Brain: No acute intracranial hemorrhage. 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 Mild chronic small-vessel disease. No hydrocephalus or extra-axial collection. Vascular: No hyperdense vessel. Skull: No calvarial fracture or suspicious bone lesion. Skull base is unremarkable. Sinuses/Orbits: Mastoids and paranasal sinuses are well aerated. Partial left globe prosthesis. Other: Extensive calcifications of the scalp vessels. IMPRESSION: 1. No acute intracranial abnormality. 2. 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 Electronically Signed   By: Emmit Alexanders M.D.   On: 04/06/2022 08:42    Labs: BMET Recent Labs  Lab 04/06/22 0751 04/07/22 0449  NA 143 138  K 6.1* 5.2*  CL 114* 106  CO2 17* 21*  GLUCOSE 78 100*  BUN 72* 61*  CREATININE 10.66* 8.89*  CALCIUM 8.5* 8.2*  CBC Recent Labs  Lab 04/06/22 0751 04/07/22 0449  WBC 6.4 5.5  HGB 10.0* 10.4*  HCT 33.3* 34.4*  MCV 73.3* 73.5*  PLT 161 151    Medications:     amiodarone  200 mg Oral BH-q7a   amLODipine  10 mg Oral Daily   atorvastatin  80 mg Oral QHS    calcium acetate  1,334 mg Oral TID WC   carvedilol  25 mg Oral BID   heparin  5,000 Units Subcutaneous Q8H   hydrALAZINE  25 mg Oral TID   levETIRAcetam  750 mg Oral Once per day on Tue Thu Sat   And   levETIRAcetam  750 mg Oral 2 times per day on Sun Mon Wed Fri   sodium zirconium cyclosilicate  10 g Oral TID      Gean Quint, MD [facility] 04/07/2022, 8:22 AM

## 2022-04-07 NOTE — Care Management Obs Status (Signed)
[facility] NOTIFICATION   Patient Details  Name: [name]. MRN: [id number] Date of Birth: 11/24/1970   Medicare Observation Status Notification Given:  Yes    Tommy Medal 04/07/2022, 4:13 PM

## 2022-04-07 NOTE — Progress Notes (Signed)
PROGRESS NOTE   [name].  PF:[id number] DOB: December 30, 1970 DOA: 04/06/2022 PCP: [name], MD   Chief Complaint  Patient presents with   Altered Mental Status   Level of care: Telemetry  Brief Admission History:  52 y.o. male with medical history significant for diabetes mellitus, seizure, atrial fibrillation, diastolic CHF, coronary artery disease, ESRD. Patient was sent to the ED from HD center reports of change in speech, altered mental status.  At the time of my evaluation, patient is answering questions appropriately, sometimes choosing not to answer, and occasionally answer that he does not remember-like saying he wanted to stop his dialysis, and if and what blood pressure medications he took today.  He otherwise denies pain or any other symptoms, does not make urine.  He tells me he is here because of his blood pressure.  Per ED notes, patient was sent to the ED from dialysis center because he was not acting right. Wife reported that patient has been slow to respond, with tremors over the past few months.  Evaluated by his neurologist and had MRI 03/01/2022 which was unremarkable for any new changes.  Patient missed his dialysis on Saturday- 2/10, because he was not feeling well.  Did not get dialyzed today either.     Assessment and Plan: * Encephalopathy Encephalopathy type as yet unspecified.  Currently appears to have improved.  Differentials include hypertensive encephalopathy, ? encephalomalacia as etiology.  Afebrile without leukocytosis.  Blood pressure as high as 99991111 systolic.  Symptoms ongoing over the past few months. 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 -Recent MRI 03/01/2022 without acute abnormality, shows similar remote bifrontal cortical infarcts,  prior hemorrhage. -IV hydralazine 10 mg as needed. -Appreciate neurology consultation and recommendations -anticipate DC home after HD on 2/15 if remains stable;    Seizure [facility]) -neurology adjusted doses of keppra and close follow up with Dr April Manson recommended  Hypertensive urgency Blood pressure up to 99991111 systolic.  Given 20 mg of labetalol with improvement to 180s. -Continue with IV hydralazine 10 mg for systolic greater than [id number] -Resume home carvedilol, clonidine 0.3, hydralazine 25 - 3 times daily  ESRD on hemodialysis ([place]) -More HD planned for 2/15 -anticipate DC home 2/15 after HD if remain stable   Paroxysmal atrial fibrillation (HCC) Rate controlled on amiodarone and carvedilol.  Not on anticoagulation. -Resume amiodarone and carvedilol  Chronic Grade 2 diastolic heart failure/EF 55 to 60% 1+ bilateral lower extremity edema, patient stable chronic and unchanged.  Recent echo 02/2022 EF of 60 to 65% severe LVH. -Volume status per HD  Uncontrolled type 2 diabetes mellitus with hyperglycemia, with long-term current use of insulin (HCC) CBG (last 3)  Recent Labs    04/06/22 0745 04/06/22 2332  GLUCAP 82 177*      DVT prophylaxis: [place] heparin Code Status: Full  Family Communication:  Disposition: Status is: Observation The patient remains OBS appropriate and will d/c before 2 midnights.   Consultants:  Nephrology Neurology   Procedures:  Hemodialysis 2/13, Antimicrobials:    Subjective: Pt much less confused today.  He received HD yesterday.  BP a little better today.  Objective: Vitals:   04/06/22 2049 04/06/22 2300 04/07/22 0427 04/07/22 0926  BP: (!) 168/87 (!) 190/77 (!) 173/74 (!) 166/74  Pulse: 86 76 66 70  Resp: 18 18 18 18  $ Temp: 98.3 F (36.8 C)  97.8 F (36.6 C)   TempSrc:      SpO2: 96% 100% 96% 100%  Weight:      Height:       No intake or output data in the 24 hours ending 04/07/22 1637 Filed Weights   04/06/22 0742 04/06/22 1138  04/06/22 1523  Weight: 87.1 kg 87.2 kg 85.3 kg   Examination:  General exam: Appears calm and comfortable  Respiratory system: Clear to auscultation. Respiratory effort normal. Cardiovascular system: normal S1 & S2 heard. No JVD, murmurs, rubs, gallops or clicks. No pedal edema. Gastrointestinal system: Abdomen is nondistended, soft and nontender. No organomegaly or masses felt. Normal bowel sounds heard. Central nervous system: Alert and oriented. No focal neurological deficits. Extremities: Symmetric 5 x 5 power. Skin: No rashes, lesions or ulcers. Psychiatry: Judgement and insight appear normal. Mood & affect appropriate.   Data Reviewed: I have personally reviewed following labs and imaging studies  CBC: Recent Labs  Lab 04/06/22 0751 04/07/22 0449  WBC 6.4 5.5  HGB 10.0* 10.4*  HCT 33.3* 34.4*  MCV 73.3* 73.5*  PLT 161 [id number]    Basic Metabolic Panel: Recent Labs  Lab 04/06/22 0751 04/07/22 0449  NA 143 138  K 6.1* 5.2*  CL 114* 106  CO2 17* 21*  GLUCOSE 78 100*  BUN 72* 61*  CREATININE 10.66* 8.89*  CALCIUM 8.5* 8.2*    CBG: Recent Labs  Lab 04/06/22 0745 04/06/22 2332  GLUCAP 82 177*    Recent Results (from the past 240 hour(s))  Resp panel by RT-PCR (RSV, Flu A&B, Covid) Anterior Nasal Swab     Status: None   Collection Time: 04/06/22  8:45 AM   Specimen: Anterior Nasal Swab  Result Value Ref Range Status   SARS Coronavirus 2 by RT PCR NEGATIVE NEGATIVE Final    Comment: (NOTE) SARS-CoV-2 target nucleic acids are NOT DETECTED.  The SARS-CoV-2 RNA is generally detectable in upper respiratory specimens during the acute phase of infection. The lowest concentration of SARS-CoV-2 viral copies this assay can detect is 138 copies/mL. A negative result does not preclude SARS-Cov-2 infection and should not be used as the sole basis for treatment or other patient management decisions. A negative result may occur with  improper specimen collection/handling,  submission of specimen other than nasopharyngeal swab, presence of viral mutation(s) within the areas targeted by this assay, and inadequate number of viral copies(<138 copies/mL). A negative result must be combined with clinical observations, patient history, and epidemiological information. The expected result is Negative.  Fact Sheet for Patients:  EntrepreneurPulse.com.au  Fact Sheet for Healthcare Providers:  IncredibleEmployment.be  This test is no t yet approved or cleared by the [place] FDA and  has been authorized for detection and/or diagnosis of SARS-CoV-2 by FDA under an Emergency Use Authorization (EUA). This EUA will remain  in effect (meaning this test can be used) for the duration of the COVID-19 declaration under Section 564(b)(1) of the Act, 21 U.S.C.section 360bbb-3(b)(1), unless the authorization is terminated  or revoked sooner.       Influenza A by PCR NEGATIVE NEGATIVE Final   Influenza B by PCR NEGATIVE NEGATIVE Final    Comment: (NOTE) The Xpert Xpress SARS-CoV-2/FLU/RSV plus assay is intended as an aid in the diagnosis of influenza from Nasopharyngeal swab specimens and should not be used as a sole basis for treatment. Nasal washings and aspirates are unacceptable for Xpert Xpress SARS-CoV-2/FLU/RSV  testing.  Fact Sheet for Patients: EntrepreneurPulse.com.au  Fact Sheet for Healthcare Providers: IncredibleEmployment.be  This test is not yet approved or cleared by the [place] FDA and has been authorized for detection and/or diagnosis of SARS-CoV-2 by FDA under an Emergency Use Authorization (EUA). This EUA will remain in effect (meaning this test can be used) for the duration of the COVID-19 declaration under Section 564(b)(1) of the Act, 21 U.S.C. section 360bbb-3(b)(1), unless the authorization is terminated or revoked.     Resp Syncytial Virus by PCR NEGATIVE  NEGATIVE Final    Comment: (NOTE) Fact Sheet for Patients: EntrepreneurPulse.com.au  Fact Sheet for Healthcare Providers: IncredibleEmployment.be  This test is not yet approved or cleared by the [place] FDA and has been authorized for detection and/or diagnosis of SARS-CoV-2 by FDA under an Emergency Use Authorization (EUA). This EUA will remain in effect (meaning this test can be used) for the duration of the COVID-19 declaration under Section 564(b)(1) of the Act, 21 U.S.C. section 360bbb-3(b)(1), unless the authorization is terminated or revoked.  Performed at [facility], [address], [place], [place] [address]      Radiology Studies: [facility] 1 View  Result Date: 04/06/2022 CLINICAL DATA:  SOB EXAM: PORTABLE CHEST 1 VIEW COMPARISON:  03/01/2022 FINDINGS: Cardiac silhouette appears prominent. No pneumonia or pulmonary edema. No pneumothorax or pleural effusion. IMPRESSION: Enlarged cardiac silhouette. Electronically Signed   By: Sammie Bench M.D.   On: 04/06/2022 08:47   CT Head Wo Contrast  Result Date: 04/06/2022 CLINICAL DATA:  Mental status change, unknown cause. EXAM: CT HEAD WITHOUT CONTRAST TECHNIQUE: Contiguous axial images were obtained from the base of the skull through the vertex without intravenous contrast. RADIATION DOSE REDUCTION: This exam was performed according to the departmental dose-optimization program which includes automated exposure control, adjustment of the mA and/or kV according to patient size and/or use of iterative reconstruction technique. COMPARISON:  Head CT 03/04/2022. FINDINGS: Brain: No acute intracranial hemorrhage. 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 Mild chronic small-vessel disease. No hydrocephalus or extra-axial collection.  Vascular: No hyperdense vessel. Skull: No calvarial fracture or suspicious bone lesion. Skull base is unremarkable. Sinuses/Orbits: Mastoids and paranasal sinuses are well aerated. Partial left globe prosthesis. Other: Extensive calcifications of the scalp vessels. IMPRESSION: 1. No acute intracranial abnormality. 2. 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 Electronically Signed   By: Emmit Alexanders M.D.   On: 04/06/2022 08:42    Scheduled Meds:  amiodarone  200 mg Oral BH-q7a   amLODipine  10 mg Oral Daily   atorvastatin  80 mg Oral QHS   [START ON 04/08/2022] calcitRIOL  1.5 mcg Oral Q T,Th,Sa-HD   calcium acetate  1,334 mg Oral TID WC   carvedilol  25 mg Oral BID   [START ON 04/08/2022] cinacalcet  30 mg Oral Q T,Th,Sa-HD   heparin  5,000 Units Subcutaneous Q8H   hydrALAZINE  25 mg Oral TID   [START ON 04/08/2022] levETIRAcetam  500 mg Oral QPC breakfast   And   levETIRAcetam  750 mg Oral QHS   And   [START ON 04/08/2022] levETIRAcetam  250 mg Oral Q T,Th,Sa-HD   sodium zirconium cyclosilicate  10 g Oral TID   Continuous Infusions:   LOS: 0 days  Time spent: 36 mins  [facility] [place] Wynetta Emery, MD How to contact the [facility] Attending or Consulting provider [place] or covering provider during after hours [place], for this patient?  Check the care team in [facility] and look for a) attending/consulting [name] provider listed and b) the [facility] team listed Log into [email or web address] and use [place]'s universal password to access. If you do not have the password, please contact the hospital operator. Locate the [facility] provider you are looking for under [facility] and page to a number that you can be directly reached. If you still have difficulty reaching the provider, please page the [facility] (Director on Call) for the Hospitalists listed on amion for  assistance.  04/07/2022, 4:37 PM

## 2022-04-07 NOTE — Hospital Course (Signed)
52 y.o. male with medical history significant for diabetes mellitus, seizure, atrial fibrillation, diastolic CHF, coronary artery disease, ESRD. Patient was sent to the ED from HD center reports of change in speech, altered mental status.  At the time of my evaluation, patient is answering questions appropriately, sometimes choosing not to answer, and occasionally answer that he does not remember-like saying he wanted to stop his dialysis, and if and what blood pressure medications he took today.  He otherwise denies pain or any other symptoms, does not make urine.  He tells me he is here because of his blood pressure.  Per ED notes, patient was sent to the ED from dialysis center because he was not acting right. Wife reported that patient has been slow to respond, with tremors over the past few months.  Evaluated by his neurologist and had MRI 03/01/2022 which was unremarkable for any new changes.  Patient missed his dialysis on Saturday- 2/10, because he was not feeling well.  Did not get dialyzed today either.

## 2022-04-07 NOTE — Progress Notes (Signed)
  Transition of Care [facility]) Screening Note   Patient Details  Name: [name]. Date of Birth: 1970-11-19   Transition of Care [facility]) CM/SW Contact:    Ihor Gully, LCSW Phone Number: 04/07/2022, 9:36 AM    Transition of Care [facility]) has reviewed patient and no TOC needs have been identified at this time. We will continue to monitor patient advancement through interdisciplinary progression rounds. If new patient transition needs arise, please place a TOC consult.

## 2022-04-08 DIAGNOSIS — I5032 Chronic diastolic (congestive) heart failure: Secondary | ICD-10-CM | POA: Diagnosis not present

## 2022-04-08 DIAGNOSIS — E1165 Type 2 diabetes mellitus with hyperglycemia: Secondary | ICD-10-CM | POA: Diagnosis not present

## 2022-04-08 DIAGNOSIS — G9340 Encephalopathy, unspecified: Secondary | ICD-10-CM | POA: Diagnosis not present

## 2022-04-08 DIAGNOSIS — N186 End stage renal disease: Secondary | ICD-10-CM | POA: Diagnosis not present

## 2022-04-08 LAB — RENAL FUNCTION PANEL
Albumin: 3.2 g/dL — ABNORMAL LOW (ref 3.5–5.0)
Anion gap: 12 (ref 5–15)
BUN: 73 mg/dL — ABNORMAL HIGH (ref 6–20)
CO2: 20 mmol/L — ABNORMAL LOW (ref 22–32)
Calcium: 8 mg/dL — ABNORMAL LOW (ref 8.9–10.3)
Chloride: 102 mmol/L (ref 98–111)
Creatinine, Ser: 9.95 mg/dL — ABNORMAL HIGH (ref 0.61–1.24)
GFR, Estimated: 6 mL/min — ABNORMAL LOW (ref >60)
Glucose, Bld: 86 mg/dL (ref 70–99)
Phosphorus: 7.9 mg/dL — ABNORMAL HIGH (ref 2.5–4.6)
Potassium: 5.5 mmol/L — ABNORMAL HIGH (ref 3.5–5.1)
Sodium: 134 mmol/L — ABNORMAL LOW (ref 135–145)

## 2022-04-08 LAB — LEVETIRACETAM LEVEL: Levetiracetam Lvl: 20.1 ug/mL (ref 10.0–40.0)

## 2022-04-08 MED ORDER — PENTAFLUOROPROP-TETRAFLUOROETH EX AERO
INHALATION_SPRAY | CUTANEOUS | Status: AC
  Administered 2022-04-08: 1 via TOPICAL
  Filled 2022-04-08: qty 30

## 2022-04-08 MED ORDER — LIDOCAINE HCL (PF) 1 % IJ SOLN
INTRAMUSCULAR | Status: AC
  Administered 2022-04-08: 0.5 mL via INTRADERMAL
  Filled 2022-04-08: qty 5

## 2022-04-08 MED ORDER — LEVETIRACETAM 750 MG PO TABS: 750.0000 mg | ORAL_TABLET | Freq: Every day | ORAL | 2 refills | Status: DC

## 2022-04-08 MED ORDER — LEVETIRACETAM 250 MG PO TABS: 250.0000 mg | ORAL_TABLET | ORAL | 1 refills | Status: DC

## 2022-04-08 MED ORDER — CALCITRIOL 0.25 MCG PO CAPS
ORAL_CAPSULE | ORAL | Status: AC
  Administered 2022-04-08: 1.5 ug via ORAL
  Filled 2022-04-08: qty 6

## 2022-04-08 MED ORDER — LEVETIRACETAM 500 MG PO TABS: 500.0000 mg | ORAL_TABLET | Freq: Every day | ORAL | 1 refills | Status: DC

## 2022-04-08 NOTE — Discharge Instructions (Addendum)
Keppra Instructions:  Take 500 mg every morning after breakfast Take 750 mg every evening  On dialysis days take 250 mg every Tue, Thu, Sat     IMPORTANT INFORMATION: PAY CLOSE ATTENTION   PHYSICIAN DISCHARGE INSTRUCTIONS  Follow with Primary care provider  [name], MD  and other consultants as instructed by your Hospitalist Physician  [place] IF SYMPTOMS COME BACK, WORSEN OR NEW PROBLEM DEVELOPS   Please note: You were cared for by a hospitalist during your hospital stay. Every effort will be made to forward records to your primary care provider.  You can request that your primary care provider send for your hospital records if they have not received them.  Once you are discharged, your primary care physician will handle any further medical issues. Please note that NO REFILLS for any discharge medications will be authorized once you are discharged, as it is imperative that you return to your primary care physician (or establish a relationship with a primary care physician if you do not have one) for your post hospital discharge needs so that they can reassess your need for medications and monitor your lab values.  Please get a complete blood count and chemistry panel checked by your Primary MD at your next visit, and again as instructed by your Primary MD.  Get Medicines reviewed and adjusted: Please take all your medications with you for your next visit with your Primary MD  Laboratory/radiological data: Please request your Primary MD to go over all hospital tests and procedure/radiological results at the follow up, please ask your primary care provider to get all Hospital records sent to his/her office.  In some cases, they will be blood work, cultures and biopsy results pending at the time of your discharge. Please request that your primary care provider follow up on these results.  If you are diabetic, please bring your blood sugar  readings with you to your follow up appointment with primary care.    Please call and make your follow up appointments as soon as possible.    Also Note the following: If you experience worsening of your admission symptoms, develop shortness of breath, life threatening emergency, suicidal or homicidal thoughts you must seek medical attention immediately by calling 911 or calling your MD immediately  if symptoms less severe.  You must read complete instructions/literature along with all the possible adverse reactions/side effects for all the Medicines you take and that have been prescribed to you. Take any new Medicines after you have completely understood and accpet all the possible adverse reactions/side effects.   Do not drive when taking Pain medications or sleeping medications (Benzodiazepines)  Do not take more than prescribed Pain, Sleep and Anxiety Medications. It is not advisable to combine anxiety,sleep and pain medications without talking with your primary care practitioner  Special Instructions: If you have smoked or chewed Tobacco  in the last 2 yrs please stop smoking, stop any regular Alcohol  and or any Recreational drug use.  Wear Seat belts while driving.  Do not drive if taking any narcotic, mind altering or controlled substances or recreational drugs or alcohol.

## 2022-04-08 NOTE — Progress Notes (Signed)
  [facility] Progress Note    Assessment/ Plan:   ESRD -outpatient orders: [facility].  TTS.  3.5 hours.  EDW 6 kg.  Flow rates 350/600.  2K/2.5 calcium.  Meds: Calcitriol 1.5 mcg q. treatment, Sensipar 30 mg q. treatment, Venofer 50 mg q. weekly, Mircera 200 mcg every 2 weeks (due 04/06/2022) -HD on TTS schedule, HD today  AMS -w/u and mgmt per primary service. Neuro consulted. Possible PRES but with improvement in BP seems that his mentation improved  Hyperkalemia -K5.5 today, HD today  HTN urgency/Volume -Will UF as tolerated. 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 Transfuse PRN. Will resume his ESA if/when indicated  Secondary hyperparathyroidism -c/w phoslo. Low phos diet/renal diet -on calcitriol and sensipar  Gean Quint, MD [facility]  Subjective:   No acute events, no complaints.   Objective:   BP (!) 185/79 (BP Location: Right Arm)   Pulse 67   Temp 98.5 F (36.9 C) (Oral)   Resp 14   Ht 6\' 3"  (1.905 m)   Wt 83.2 kg   SpO2 98%   BMI 22.93 kg/m  No intake or output data in the 24 hours ending 04/08/22 0854  Weight change:   Physical Exam: Gen: NAD, laying flat in bed CVS: RRR Resp: CTA B/L Abd: nondistended Ext: trace edema b/l Les Neuro: awake, alert, calm Dialysis access: LUE AVF +b/t  Imaging: No results found.  Labs: BMET Recent Labs  Lab 04/06/22 0751 04/07/22 0449 04/08/22 0438  NA 143 138 134*  K 6.1* 5.2* 5.5*  CL 114* 106 102  CO2 17* 21* 20*  GLUCOSE 78 100* 86  BUN 72* 61* 73*  CREATININE 10.66* 8.89* 9.95*  CALCIUM 8.5* 8.2* 8.0*  PHOS  --   --  7.9*   CBC Recent Labs  Lab 04/06/22 0751 04/07/22 0449  WBC 6.4 5.5  HGB 10.0* 10.4*  HCT 33.3* 34.4*  MCV 73.3* 73.5*  PLT 161 151    Medications:     amiodarone  200 mg Oral BH-q7a   amLODipine  10 mg Oral Daily   atorvastatin  80 mg Oral QHS   calcitRIOL       calcitRIOL  1.5 mcg Oral Q T,Th,Sa-HD    calcium acetate  1,334 mg Oral TID WC   carvedilol  25 mg Oral BID   cinacalcet  30 mg Oral Q T,Th,Sa-HD   heparin  5,000 Units Subcutaneous Q8H   hydrALAZINE  25 mg Oral TID   levETIRAcetam  500 mg Oral QPC breakfast   And   levETIRAcetam  750 mg Oral QHS   And   levETIRAcetam  250 mg Oral Q T,Th,Sa-HD   lidocaine (PF)       pentafluoroprop-tetrafluoroeth          Gean Quint, MD [facility] 04/08/2022, 8:54 AM

## 2022-04-08 NOTE — Procedures (Signed)
I was present at this dialysis session. I have reviewed the session itself and made appropriate changes.  Access issues currently, high VP, running on lower BFR and doing okay so far. Will utilize 1K bath temporarily otherwise 2K bath. Can have access evaluated as an outpatient. Discussed with HD RN, will send report to his outpatient dialysis unit as he is open for potential discharge after HD today.  Filed Weights   04/06/22 1138 04/06/22 1523 04/08/22 0846  Weight: 87.2 kg 85.3 kg 83.2 kg    Recent Labs  Lab 04/08/22 0438  NA 134*  K 5.5*  CL 102  CO2 20*  GLUCOSE 86  BUN 73*  CREATININE 9.95*  CALCIUM 8.0*  PHOS 7.9*    Recent Labs  Lab 04/06/22 0751 04/07/22 0449  WBC 6.4 5.5  HGB 10.0* 10.4*  HCT 33.3* 34.4*  MCV 73.3* 73.5*  PLT 161 151    Scheduled Meds:  amiodarone  200 mg Oral BH-q7a   amLODipine  10 mg Oral Daily   atorvastatin  80 mg Oral QHS   calcitRIOL       calcitRIOL  1.5 mcg Oral Q T,Th,Sa-HD   calcium acetate  1,334 mg Oral TID WC   carvedilol  25 mg Oral BID   cinacalcet  30 mg Oral Q T,Th,Sa-HD   heparin  5,000 Units Subcutaneous Q8H   hydrALAZINE  25 mg Oral TID   levETIRAcetam  500 mg Oral QPC breakfast   And   levETIRAcetam  750 mg Oral QHS   And   levETIRAcetam  250 mg Oral Q T,Th,Sa-HD   Continuous Infusions: PRN Meds:.acetaminophen **OR** acetaminophen, calcitRIOL, hydrALAZINE, lidocaine (PF), lidocaine-prilocaine, pentafluoroprop-tetrafluoroeth, polyethylene glycol   Gean Quint, MD [facility] 04/08/2022, 9:20 AM

## 2022-04-08 NOTE — Procedures (Signed)
   HEMODIALYSIS TREATMENT NOTE:  3 hour session completed.  Pt elected to end session 30 minutes early / AMA.- - nephrologist notified.  3 liters removed.  All blood was returned.  Eager to eat lunch and discharge home.    Rockwell Alexandria, RN  AP [facility]

## 2022-04-08 NOTE — Discharge Summary (Signed)
Physician Discharge Summary  [name] [id number] DOB: 08-Oct-1970 DOA: 04/06/2022  PCP: [name], MD  Admit date: 04/06/2022 Discharge date: 04/08/2022  Admitted From:  Home  Disposition: Home   Recommendations for Outpatient Follow-up:  Follow up with PCP in 1 weeks Resume regular TTS HD schedule Please follow up with [facility] Dr. April Manson in 3 months   Discharge Condition: stable  CODE STATUS: FULL DIET: RESUME RENAL DIET    Brief Hospitalization Summary: Please see all hospital notes, images, labs for full details of the hospitalization. ADMISSION HPI:   52 y.o. male with medical history significant for diabetes mellitus, seizure, atrial fibrillation, diastolic CHF, coronary artery disease, ESRD.  Patient was sent to the ED from HD center reports of change in speech, altered mental status.  At the time of my evaluation, patient is answering questions appropriately, sometimes choosing not to answer, and occasionally answer that he does not remember-like saying he wanted to stop his dialysis, and if and what blood pressure medications he took today.  He otherwise denies pain or any other symptoms, does not make urine.  He tells me he is here because of his blood pressure.  Per ED notes, patient was sent to the ED from dialysis center because he was not acting right. Wife reported that patient has been slow to respond, with tremors over the past few months.  Evaluated by his neurologist and had MRI 03/01/2022 which was unremarkable for any new changes.  Patient missed his dialysis on Saturday- 2/10, because he was not feeling well.  Did not get dialyzed today either.     Assessment and Plan:  Encephalopathy - RESOLVED  Encephalopathy type as yet unspecified.  Currently appears to have improved.  Differentials include hypertensive encephalopathy, ? encephalomalacia as etiology.  Afebrile without leukocytosis.  Blood pressure as high as 99991111 systolic.  Symptoms ongoing over the past  few months. 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 -Recent MRI 03/01/2022 without acute abnormality, shows similar remote bifrontal cortical infarcts, prior hemorrhage. -IV hydralazine 10 mg as needed. -Appreciate neurology consultation and recommendations -anticipate DC home after HD on 2/15 if remains stable;     Seizure  -neurology adjusted doses of keppra and close follow up with Dr April Manson recommended   Hypertensive urgency Blood pressure up to 99991111 systolic.  Given 20 mg of labetalol with improvement to 180s. -Continued with IV hydralazine 10 mg for systolic greater than [id number] -Resumed home carvedilol, clonidine 0.3, hydralazine 25 - 3 times daily and BP much better controlled    ESRD on hemodialysis  -More HD planned for 2/15  -anticipate DC home 2/15 after HD if remain stable    Paroxysmal atrial fibrillation Rate controlled on amiodarone and carvedilol.  Not on anticoagulation. -Resume amiodarone and carvedilol   Chronic Grade 2 diastolic heart failure/EF 55 to 60% 1+ bilateral lower extremity edema, patient stable chronic and unchanged.  Recent echo 02/2022 EF of 60 to 65% severe LVH. -Volume status per HD   Uncontrolled type 2 diabetes mellitus with hyperglycemia, with long-term current use of insulin (HCC) CBG (last 3)      Recent Labs    04/06/22 0745 04/06/22 2332  GLUCAP 82 177*        Discharge Diagnoses:  Principal Problem:   Encephalopathy Active Problems:  Uncontrolled type 2 diabetes mellitus with hyperglycemia, with long-term current use of insulin (HCC)   Chronic Grade 2 diastolic heart failure/EF 55 to 60%   Paroxysmal atrial fibrillation (HCC)   ESRD on hemodialysis ([place])   Hypertensive urgency   Seizure [facility])   Discharge  Instructions: Discharge Instructions     Ambulatory referral to Neurology   Complete by: As directed    An appointment is requested in approximately: 12 weeks      Allergies as of 04/08/2022       Reactions   Promethazine Diarrhea, Other (See Comments)   Muscle cramps   Promethazine Hcl Other (See Comments)   Cramping all over Other reaction(s): cramp all over   Other    Other reaction(s): Unknown Other reaction(s): Unknown        Medication List     STOP taking these medications    Benefiber Powd   irbesartan 300 MG tablet Commonly known as: AVAPRO       TAKE these medications    acetaminophen 325 MG tablet Commonly known as: TYLENOL Take 1-2 tablets (325-650 mg total) by mouth every 4 (four) hours as needed for mild pain.   amiodarone 200 MG tablet Commonly known as: PACERONE Take 1 tablet (200 mg total) by mouth every morning.   amLODipine 10 MG tablet Commonly known as: NORVASC Take 1 tablet (10 mg total) by mouth daily. What changed: how much to take   atorvastatin 80 MG tablet Commonly known as: LIPITOR Take 1 tablet (80 mg total) by mouth daily.   calcium acetate 667 MG capsule Commonly known as: PHOSLO Take 2 capsules (1,334 mg total) by mouth 3 (three) times daily with meals.   carvedilol 25 MG tablet Commonly known as: COREG Take 25 mg by mouth 2 (two) times daily.   cloNIDine 0.3 MG tablet Commonly known as: CATAPRES Take 1 tablet (0.3 mg total) by mouth every 8 (eight) hours.   diphenoxylate-atropine 2.5-0.025 MG tablet Commonly known as: LOMOTIL Take as needed for diarrhea. Can take up to 4 times daily.   dorzolamide-timolol 2-0.5 % ophthalmic solution Commonly known as: COSOPT Place 1 drop into the right eye 2 (two) times daily.   hydrALAZINE 25 MG tablet Commonly known as: APRESOLINE Take 25 mg by mouth 3 (three) times daily.   levETIRAcetam 250 MG tablet Commonly known as: KEPPRA Take 1 tablet (250 mg total) by mouth  Every Tuesday,Thursday,and Saturday with dialysis. What changed:  Another medication with the same name was added. Make sure you understand how and when to take each. Another medication with the same name was changed. Make sure you understand how and when to take each.   levETIRAcetam 750 MG tablet Commonly known as: KEPPRA Take 1 tablet (750 mg total) by mouth at bedtime. What changed: when to take this   levETIRAcetam 500 MG tablet Commonly known as: KEPPRA Take 1 tablet (500 mg total) by mouth daily after breakfast. What changed: You were already taking a medication with the same name, and this prescription was added. Make sure you understand how and when to take each.   lidocaine-prilocaine cream Commonly known as: EMLA Apply topically.   nitroGLYCERIN 0.4 MG SL tablet Commonly known as: NITROSTAT Place 1 tablet (0.4 mg total) under the tongue every 5 (five) minutes as needed for chest pain.        Follow-up Information     [name], MD Follow up in 1 week(s).   Specialty: Family Medicine Why: Hospital Follow  Up Contact information: [address] [place] [address] [phone]         Alric Ran, MD. Schedule an appointment as soon as possible for a visit in 3 month(s).   Specialty: Neurology Why: Hospital Follow Up Contact information: [address] [place] [address] [phone]                Allergies  Allergen Reactions   Promethazine Diarrhea and Other (See Comments)    Muscle cramps   Promethazine Hcl Other (See Comments)    Cramping all over  Other reaction(s): cramp all over   Other     Other reaction(s): Unknown Other reaction(s): Unknown   Allergies as of 04/08/2022       Reactions   Promethazine Diarrhea, Other (See Comments)   Muscle cramps   Promethazine Hcl Other (See Comments)   Cramping all over Other reaction(s): cramp all over   Other    Other reaction(s): Unknown Other reaction(s): Unknown         Medication List     STOP taking these medications    Benefiber Powd   irbesartan 300 MG tablet Commonly known as: AVAPRO       TAKE these medications    acetaminophen 325 MG tablet Commonly known as: TYLENOL Take 1-2 tablets (325-650 mg total) by mouth every 4 (four) hours as needed for mild pain.   amiodarone 200 MG tablet Commonly known as: PACERONE Take 1 tablet (200 mg total) by mouth every morning.   amLODipine 10 MG tablet Commonly known as: NORVASC Take 1 tablet (10 mg total) by mouth daily. What changed: how much to take   atorvastatin 80 MG tablet Commonly known as: LIPITOR Take 1 tablet (80 mg total) by mouth daily.   calcium acetate 667 MG capsule Commonly known as: PHOSLO Take 2 capsules (1,334 mg total) by mouth 3 (three) times daily with meals.   carvedilol 25 MG tablet Commonly known as: COREG Take 25 mg by mouth 2 (two) times daily.   cloNIDine 0.3 MG tablet Commonly known as: CATAPRES Take 1 tablet (0.3 mg total) by mouth every 8 (eight) hours.   diphenoxylate-atropine 2.5-0.025 MG tablet Commonly known as: LOMOTIL Take as needed for diarrhea. Can take up to 4 times daily.   dorzolamide-timolol 2-0.5 % ophthalmic solution Commonly known as: COSOPT Place 1 drop into the right eye 2 (two) times daily.   hydrALAZINE 25 MG tablet Commonly known as: APRESOLINE Take 25 mg by mouth 3 (three) times daily.   levETIRAcetam 250 MG tablet Commonly known as: KEPPRA Take 1 tablet (250 mg total) by mouth Every Tuesday,Thursday,and Saturday with dialysis. What changed:  Another medication with the same name was added. Make sure you understand how and when to take each. Another medication with the same name was changed. Make sure you understand how and when to take each.   levETIRAcetam 750 MG tablet Commonly known as: KEPPRA Take 1 tablet (750 mg total) by mouth at bedtime. What changed: when to take this   levETIRAcetam 500 MG  tablet Commonly known as: KEPPRA Take 1 tablet (500 mg total) by mouth daily after breakfast. What changed: You were already taking a medication with the same name, and this prescription was added. Make sure you understand how and when to take each.   lidocaine-prilocaine cream Commonly known as: EMLA Apply topically.   nitroGLYCERIN 0.4 MG SL tablet Commonly known as: NITROSTAT Place 1 tablet (0.4 mg total) under  the tongue every 5 (five) minutes as needed for chest pain.        Procedures/Studies: DG Chest Port 1 View  Result Date: 04/06/2022 CLINICAL DATA:  SOB EXAM: PORTABLE CHEST 1 VIEW COMPARISON:  03/01/2022 FINDINGS: Cardiac silhouette appears prominent. No pneumonia or pulmonary edema. No pneumothorax or pleural effusion. IMPRESSION: Enlarged cardiac silhouette. Electronically Signed   By: Sammie Bench M.D.   On: 04/06/2022 08:47   CT Head Wo Contrast  Result Date: 04/06/2022 CLINICAL DATA:  Mental status change, unknown cause. EXAM: CT HEAD WITHOUT CONTRAST TECHNIQUE: Contiguous axial images were obtained from the base of the skull through the vertex without intravenous contrast. RADIATION DOSE REDUCTION: This exam was performed according to the departmental dose-optimization program which includes automated exposure control, adjustment of the mA and/or kV according to patient size and/or use of iterative reconstruction technique. COMPARISON:  Head CT 03/04/2022. FINDINGS: Brain: No acute intracranial hemorrhage. 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 Mild chronic small-vessel disease. No hydrocephalus or extra-axial collection. Vascular: No hyperdense vessel. Skull: No calvarial fracture or suspicious bone lesion. Skull base is unremarkable. Sinuses/Orbits: Mastoids and paranasal sinuses are well aerated. Partial left  globe prosthesis. Other: Extensive calcifications of the scalp vessels. IMPRESSION: 1. No acute intracranial abnormality. 2. 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 Electronically Signed   By: Emmit Alexanders M.D.   On: 04/06/2022 08:42   DG Foot Complete Right  Result Date: 03/22/2022 Please see detailed radiograph report in office note.  PERIPHERAL VASCULAR CATHETERIZATION  Result Date: 03/15/2022 See surgical note for result.    Subjective: Pt reports that he is feeling much better, he is alert and oriented. He will follow up with his neurologist.   Discharge Exam: Vitals:   04/08/22 1200 04/08/22 1215  BP: (!) 175/79 (!) 157/76  Pulse:    Resp: 13 14  Temp:    SpO2:     Vitals:   04/08/22 1100 04/08/22 1130 04/08/22 1200 04/08/22 1215  BP: (!) 157/84 (!) 159/85 (!) 175/79 (!) 157/76  Pulse:      Resp: 13 18 13 14  $ Temp:      TempSrc:      SpO2:      Weight:      Height:        General: Pt is alert, awake, not in acute distress Cardiovascular: RRR, S1/S2 +, no rubs, no gallops Respiratory: CTA bilaterally, no wheezing, no rhonchi Abdominal: Soft, NT, ND, bowel sounds + Extremities: no edema, no cyanosis   The results of significant diagnostics from this hospitalization (including imaging, microbiology, ancillary and laboratory) are listed below for reference.     Microbiology: Recent Results (from the past 240 hour(s))  Resp panel by RT-PCR (RSV, Flu A&B, Covid) Anterior Nasal Swab     Status: None   Collection Time: 04/06/22  8:45 AM   Specimen: Anterior Nasal Swab  Result Value Ref Range Status   SARS Coronavirus 2 by RT PCR NEGATIVE NEGATIVE Final    Comment: (NOTE) SARS-CoV-2 target nucleic acids are NOT DETECTED.  The SARS-CoV-2 RNA is generally detectable in upper respiratory specimens during  the acute phase of infection. The lowest concentration of SARS-CoV-2 viral copies this assay can detect is 138 copies/mL. A negative result does not preclude SARS-Cov-2  infection and should not be used as the sole basis for treatment or other patient management decisions. A negative result may occur with  improper specimen collection/handling, submission of specimen other than nasopharyngeal swab, presence of viral mutation(s) within the areas targeted by this assay, and inadequate number of viral copies(<138 copies/mL). A negative result must be combined with clinical observations, patient history, and epidemiological information. The expected result is Negative.  Fact Sheet for Patients:  EntrepreneurPulse.com.au  Fact Sheet for Healthcare Providers:  IncredibleEmployment.be  This test is no t yet approved or cleared by the [place] FDA and  has been authorized for detection and/or diagnosis of SARS-CoV-2 by FDA under an Emergency Use Authorization (EUA). This EUA will remain  in effect (meaning this test can be used) for the duration of the COVID-19 declaration under Section 564(b)(1) of the Act, 21 U.S.C.section 360bbb-3(b)(1), unless the authorization is terminated  or revoked sooner.       Influenza A by PCR NEGATIVE NEGATIVE Final   Influenza B by PCR NEGATIVE NEGATIVE Final    Comment: (NOTE) The Xpert Xpress SARS-CoV-2/FLU/RSV plus assay is intended as an aid in the diagnosis of influenza from Nasopharyngeal swab specimens and should not be used as a sole basis for treatment. Nasal washings and aspirates are unacceptable for Xpert Xpress SARS-CoV-2/FLU/RSV testing.  Fact Sheet for Patients: EntrepreneurPulse.com.au  Fact Sheet for Healthcare Providers: IncredibleEmployment.be  This test is not yet approved or cleared by the [place] FDA and has been authorized for detection and/or  diagnosis of SARS-CoV-2 by FDA under an Emergency Use Authorization (EUA). This EUA will remain in effect (meaning this test can be used) for the duration of the COVID-19 declaration under Section 564(b)(1) of the Act, 21 U.S.C. section 360bbb-3(b)(1), unless the authorization is terminated or revoked.     Resp Syncytial Virus by PCR NEGATIVE NEGATIVE Final    Comment: (NOTE) Fact Sheet for Patients: EntrepreneurPulse.com.au  Fact Sheet for Healthcare Providers: IncredibleEmployment.be  This test is not yet approved or cleared by the [place] FDA and has been authorized for detection and/or diagnosis of SARS-CoV-2 by FDA under an Emergency Use Authorization (EUA). This EUA will remain in effect (meaning this test can be used) for the duration of the COVID-19 declaration under Section 564(b)(1) of the Act, 21 U.S.C. section 360bbb-3(b)(1), unless the authorization is terminated or revoked.  Performed at [facility], [address] [place], [place] [address]      Labs: BNP (last 3 results) Recent Labs    07/26/21 1415 11/21/21 1117  BNP 1,785.0* Q000111Q*   Basic Metabolic Panel: Recent Labs  Lab 04/06/22 0751 04/07/22 0449 04/08/22 0438  NA 143 138 134*  K 6.1* 5.2* 5.5*  CL 114* 106 102  CO2 17* 21* 20*  GLUCOSE 78 100* 86  BUN 72* 61* 73*  CREATININE 10.66* 8.89* 9.95*  CALCIUM 8.5* 8.2* 8.0*  PHOS  --   --  7.9*   Liver Function Tests: Recent Labs  Lab 04/06/22 0751 04/08/22 0438  AST 21  --   ALT 25  --   ALKPHOS 96  --   BILITOT 0.6  --   PROT 6.2*  --   ALBUMIN 3.3* 3.2*   No results for input(s): "LIPASE", "AMYLASE" in the last 168 hours. No results for input(s): "AMMONIA" in the last 168 hours. CBC: Recent Labs  Lab 04/06/22 0751 04/07/22 0449  WBC 6.4 5.5  HGB 10.0* 10.4*  HCT 33.3* 34.4*  MCV 73.3* 73.5*  PLT 161 151   Cardiac Enzymes: No results for input(s): "CKTOTAL", "CKMB", "CKMBINDEX",  "TROPONINI" in the last 168 hours. BNP: Invalid input(s): "POCBNP" CBG: Recent Labs  Lab 04/06/22 0745 04/06/22 2332 04/07/22 2157  GLUCAP 82 177* 80   D-Dimer No results for input(s): "DDIMER" in the last 72 hours. Hgb A1c No results for input(s): "HGBA1C" in the last 72 hours. Lipid Profile No results for input(s): "CHOL", "HDL", "LDLCALC", "TRIG", "CHOLHDL", "LDLDIRECT" in the last 72 hours. Thyroid function studies Recent Labs    04/07/22 1500  TSH 6.017*   Anemia work up No results for input(s): "VITAMINB12", "FOLATE", "FERRITIN", "TIBC", "IRON", "RETICCTPCT" in the last 72 hours. Urinalysis    Component Value Date/Time   COLORURINE YELLOW (A) 11/07/2017 1855   APPEARANCEUR CLOUDY (A) 11/07/2017 1855   LABSPEC 1.016 11/07/2017 1855   PHURINE 5.0 11/07/2017 1855   GLUCOSEU >=500 (A) 11/07/2017 1855   HGBUR MODERATE (A) 11/07/2017 1855   BILIRUBINUR NEGATIVE 11/07/2017 1855   KETONESUR NEGATIVE 11/07/2017 1855   PROTEINUR >=300 (A) 11/07/2017 1855   NITRITE NEGATIVE 11/07/2017 1855   LEUKOCYTESUR NEGATIVE 11/07/2017 1855   Sepsis Labs Recent Labs  Lab 04/06/22 0751 04/07/22 0449  WBC 6.4 5.5   Microbiology Recent Results (from the past 240 hour(s))  Resp panel by RT-PCR (RSV, Flu A&B, Covid) Anterior Nasal Swab     Status: None   Collection Time: 04/06/22  8:45 AM   Specimen: Anterior Nasal Swab  Result Value Ref Range Status   SARS Coronavirus 2 by RT PCR NEGATIVE NEGATIVE Final    Comment: (NOTE) SARS-CoV-2 target nucleic acids are NOT DETECTED.  The SARS-CoV-2 RNA is generally detectable in upper respiratory specimens during the acute phase of infection. The lowest concentration of SARS-CoV-2 viral copies this assay can detect is 138 copies/mL. A negative result does not preclude SARS-Cov-2 infection and should not be used as the sole basis for treatment or other patient management decisions. A negative result may occur with  improper specimen  collection/handling, submission of specimen other than nasopharyngeal swab, presence of viral mutation(s) within the areas targeted by this assay, and inadequate number of viral copies(<138 copies/mL). A negative result must be combined with clinical observations, patient history, and epidemiological information. The expected result is Negative.  Fact Sheet for Patients:  EntrepreneurPulse.com.au  Fact Sheet for Healthcare Providers:  IncredibleEmployment.be  This test is no t yet approved or cleared by the [place] FDA and  has been authorized for detection and/or diagnosis of SARS-CoV-2 by FDA under an Emergency Use Authorization (EUA). This EUA will remain  in effect (meaning this test can be used) for the duration of the COVID-19 declaration under Section 564(b)(1) of the Act, 21 U.S.C.section 360bbb-3(b)(1), unless the authorization is terminated  or revoked sooner.       Influenza A by PCR NEGATIVE NEGATIVE Final   Influenza B by PCR NEGATIVE NEGATIVE Final    Comment: (NOTE) The Xpert Xpress SARS-CoV-2/FLU/RSV plus assay is intended as an aid in the diagnosis of influenza from Nasopharyngeal swab specimens and should not be used as a sole basis for treatment. Nasal washings and aspirates are unacceptable for Xpert Xpress SARS-CoV-2/FLU/RSV testing.  Fact Sheet for Patients: EntrepreneurPulse.com.au  Fact Sheet for Healthcare Providers: IncredibleEmployment.be  This test is not yet approved or cleared by the [place] FDA and has been authorized for detection and/or diagnosis of SARS-CoV-2 by FDA under an Emergency Use Authorization (EUA). This EUA will remain in effect (meaning this  test can be used) for the duration of the COVID-19 declaration under Section 564(b)(1) of the Act, 21 U.S.C. section 360bbb-3(b)(1), unless the authorization is terminated or revoked.     Resp Syncytial  Virus by PCR NEGATIVE NEGATIVE Final    Comment: (NOTE) Fact Sheet for Patients: EntrepreneurPulse.com.au  Fact Sheet for Healthcare Providers: IncredibleEmployment.be  This test is not yet approved or cleared by the [place] FDA and has been authorized for detection and/or diagnosis of SARS-CoV-2 by FDA under an Emergency Use Authorization (EUA). This EUA will remain in effect (meaning this test can be used) for the duration of the COVID-19 declaration under Section 564(b)(1) of the Act, 21 U.S.C. section 360bbb-3(b)(1), unless the authorization is terminated or revoked.  Performed at [facility], [address] [place], [place] [address]     Time coordinating discharge: 36 mins   SIGNED:  Irwin Brakeman, MD  [facility] 04/08/2022, 12:46 PM How to contact the [facility] Attending or Consulting provider [place] or covering provider during after hours [place], for this patient?  Check the care team in [facility] and look for a) attending/consulting [name] provider listed and b) the [facility] team listed Log into [email or web address] and use [place]'s universal password to access. If you do not have the password, please contact the hospital operator. Locate the [facility] provider you are looking for under [facility] and page to a number that you can be directly reached. If you still have difficulty reaching the provider, please page the [facility] (Director on Call) for the Hospitalists listed on amion for assistance.

## 2022-04-19 ENCOUNTER — Ambulatory Visit: Payer: Medicare HMO | Admitting: Interventional Cardiology

## 2022-04-22 ENCOUNTER — Encounter: Payer: Self-pay | Admitting: Radiology

## 2022-04-25 NOTE — Progress Notes (Signed)
COVID/Flu ordered to check for common causes of infection which could lead to delirium in this patient.

## 2022-04-26 ENCOUNTER — Encounter: Payer: Self-pay | Admitting: Podiatry

## 2022-04-26 ENCOUNTER — Ambulatory Visit: Payer: Medicare HMO | Admitting: Podiatry

## 2022-04-26 DIAGNOSIS — S98131A Complete traumatic amputation of one right lesser toe, initial encounter: Secondary | ICD-10-CM

## 2022-04-26 NOTE — Progress Notes (Signed)
Subjective:  Patient ID: [name]., male    DOB: [birth year],  MRN: [id number]  No chief complaint on file.   DOS: 02/28/22 Procedure: Right fifth digit amputation  52 y.o. male returns for POV#3. Doing well with minimal pain.   Review of Systems: Negative except as noted in the HPI. Denies N/V/F/Ch.  Past Medical History:  Diagnosis Date   Anxiety    a.) on BZO (clonazepam) PRN   Aortic dilatation ([place]) 11/25/2020   a.) TTE 11/25/2020: Ao root measured 38 mm. b.) TTE 04/22/2021: Ao root measured 44 mm; c. 11/2021 Echo: Ao root 4m.   Atrial fibrillation ([place]    a.) CHA2DS2-VASc = 6 (CHF, HTN, CVA x2, prior MI, T2DM). b.) rate/rhythm maintained on oral amiodarone + carvedilol; chronically anticoagulated using dose reduced apixaban + clopidogrel   BPH (benign prostatic hyperplasia)    Cardiac arrest ([place] 07/26/2021   a.) in setting of hyperkalemia, NSTEMI, and seizure following missed HD; pulseless and apneic --> required 1 round of CPR prior to ROSC   Cellulitis    Cerebral hemorrhage ([place]    a. 10/2021 interventricular hemorrhage in setting of HTN emergency and DOAC/APT-->eliquis/plavix d/c'd.   Chronic diarrhea    Chronic heart failure with preserved ejection fraction (HFpEF) ([place] 07/19[place]   a. 06/2017 Echo: EF 75%; b. 11/2020 Echo: EF 55-60%; c. 04/2021 Echo: EF 55-60%; d. 11/2021 Echo: EF 65-70%, GrII DD, nl RV fxn, sev dil LA, large circumferential pericardial eff w/o tamponade, triv MR, AoV sclerosis. Ao root 457m   Chronic pain of both ankles    Coronary artery disease 02/26/2020   a. 02/2020 Ant STEMI/PCI: LM nl, LAD 9072m.5x30 Resolute Onyx), RI nl, LCX min irregs, RCA min irregs. EF 65%.   Diabetes mellitus, type II ([facility]  Diabetic neuropathy ([place]  Esophagitis    ESRD (end stage renal disease) on dialysis ([place]  a.) [facility]; T-Th-Sat   Frequent falls    Gait instability    Gastritis    Gastroparesis    GERD (gastroesophageal reflux disease)     H/O enucleation of left eyeball    Heart palpitations    History of kidney stones    HLD (hyperlipidemia)    Hypertension    Insomnia    a.) uses melatonin PRN   Nausea and vomiting in adult    recurrent   NSTEMI (non-ST elevated myocardial infarction) ([facility]/05/2021   a.) hyperkalemic from missed HD; seizures; decompensated to cardiac arrest and required CPR x 1 round prior to ROSC.   Osteomyelitis (HCC)    Pericardial effusion    a. 11/2021 Echo: EF 65-70%, GrII DD, nl RV fxn, sev dil LA, large circumferential pericardial eff w/o tamponade, triv MR, AoV sclerosis. Ao root 82m81m Seizure (HCC)[place] a.) last 07/26/2021 in setting of missed HD --> hyperkalemic at 6.5 --> pulseless/apneic and required CPR; discharged home on levetiracetam.   ST elevation myocardial infarction (STEMI) of anterior wall (HCC)[place]/05/2020   a.) LHC/PCI 02/26/2020 --> EF 65%; LVEDP 11 mmHg; 90% mLAD (3.5 x 20 mm Resolute Onyx DES x 1)   Stroke (HCC)[place]/2023   a.) documented as x 3 between 04/2021 and 06/2021    Current Outpatient Medications:    acetaminophen (TYLENOL) 325 MG tablet, Take 1-2 tablets (325-650 mg total) by mouth every 4 (four) hours as needed for mild pain., Disp: , Rfl:    amiodarone (PACERONE) 200 MG tablet, Take 1 tablet (  200 mg total) by mouth every morning., Disp: 90 tablet, Rfl: 3   amLODipine (NORVASC) 10 MG tablet, Take 1 tablet (10 mg total) by mouth daily. (Patient taking differently: Take 5 mg by mouth daily.), Disp: 30 tablet, Rfl: 2   atorvastatin (LIPITOR) 80 MG tablet, Take 1 tablet (80 mg total) by mouth daily., Disp: 30 tablet, Rfl: 0   calcium acetate (PHOSLO) 667 MG capsule, Take 2 capsules (1,334 mg total) by mouth 3 (three) times daily with meals., Disp: 180 capsule, Rfl: 0   carvedilol (COREG) 25 MG tablet, Take 25 mg by mouth 2 (two) times daily., Disp: , Rfl:    cloNIDine (CATAPRES) 0.3 MG tablet, Take 1 tablet (0.3 mg total) by mouth every 8 (eight) hours., Disp: 90  tablet, Rfl: 2   diphenoxylate-atropine (LOMOTIL) 2.5-0.025 MG tablet, Take as needed for diarrhea. Can take up to 4 times daily., Disp: , Rfl:    dorzolamide-timolol (COSOPT) 22.3-6.8 MG/ML ophthalmic solution, Place 1 drop into the right eye 2 (two) times daily., Disp: , Rfl:    hydrALAZINE (APRESOLINE) 25 MG tablet, Take 25 mg by mouth 3 (three) times daily., Disp: , Rfl:    levETIRAcetam (KEPPRA) 250 MG tablet, Take 1 tablet (250 mg total) by mouth Every Tuesday,Thursday,and Saturday with dialysis., Disp: 15 tablet, Rfl: 1   levETIRAcetam (KEPPRA) 500 MG tablet, Take 1 tablet (500 mg total) by mouth daily after breakfast., Disp: 30 tablet, Rfl: 1   levETIRAcetam (KEPPRA) 750 MG tablet, Take 1 tablet (750 mg total) by mouth at bedtime., Disp: 60 tablet, Rfl: 2   lidocaine-prilocaine (EMLA) cream, Apply topically., Disp: , Rfl:    nitroGLYCERIN (NITROSTAT) 0.4 MG SL tablet, Place 1 tablet (0.4 mg total) under the tongue every 5 (five) minutes as needed for chest pain., Disp: 25 tablet, Rfl: 6  Social History   Tobacco Use  Smoking Status Never  Smokeless Tobacco Never    Allergies  Allergen Reactions   Promethazine Diarrhea and Other (See Comments)    Muscle cramps   Promethazine Hcl Other (See Comments)    Cramping all over  Other reaction(s): cramp all over   Other     Other reaction(s): Unknown Other reaction(s): Unknown   Objective:  There were no vitals filed for this visit. There is no height or weight on file to calculate BMI. Constitutional Well developed. Well nourished.  Vascular Foot warm and well perfused. Capillary refill normal to all digits.   Neurologic Normal speech. Oriented to person, place, and time. Epicritic sensation to light touch grossly present bilaterally.  Dermatologic Skin healing well without signs of infection. Skin edges well coapted without signs of infection.  Orthopedic: Tenderness to palpation noted about the surgical site.    Radiographs: Interval loss of right fifth digit.  Assessment:  No diagnosis found. Plan:  Patient was evaluated and treated and all questions answered.  S/p foot surgery right -Progressing as expected post-operatively. -WB Status: WBAT in regular shoe -Medications: n/a Patient to return in 3 months for re check/ rfc   No follow-ups on file.

## 2022-05-05 ENCOUNTER — Emergency Department (HOSPITAL_COMMUNITY)
Admission: EM | Admit: 2022-05-05 | Discharge: 2022-05-06 | Disposition: A | Discharge status: Home / Self Care | Payer: Medicare HMO | Attending: Emergency Medicine | Admitting: Emergency Medicine

## 2022-05-05 ENCOUNTER — Other Ambulatory Visit: Payer: Self-pay

## 2022-05-05 ENCOUNTER — Emergency Department (HOSPITAL_COMMUNITY): Payer: Medicare HMO

## 2022-05-05 ENCOUNTER — Encounter (HOSPITAL_COMMUNITY): Payer: Self-pay

## 2022-05-05 DIAGNOSIS — W01198A Fall on same level from slipping, tripping and stumbling with subsequent striking against other object, initial encounter: Secondary | ICD-10-CM | POA: Diagnosis not present

## 2022-05-05 DIAGNOSIS — I120 Hypertensive chronic kidney disease with stage 5 chronic kidney disease or end stage renal disease: Secondary | ICD-10-CM | POA: Insufficient documentation

## 2022-05-05 DIAGNOSIS — S60222A Contusion of left hand, initial encounter: Secondary | ICD-10-CM | POA: Insufficient documentation

## 2022-05-05 DIAGNOSIS — S20212A Contusion of left front wall of thorax, initial encounter: Secondary | ICD-10-CM | POA: Diagnosis not present

## 2022-05-05 DIAGNOSIS — Z79899 Other long term (current) drug therapy: Secondary | ICD-10-CM | POA: Diagnosis not present

## 2022-05-05 DIAGNOSIS — Z992 Dependence on renal dialysis: Secondary | ICD-10-CM | POA: Insufficient documentation

## 2022-05-05 DIAGNOSIS — N186 End stage renal disease: Secondary | ICD-10-CM | POA: Insufficient documentation

## 2022-05-05 DIAGNOSIS — Y92009 Unspecified place in unspecified non-institutional (private) residence as the place of occurrence of the external cause: Secondary | ICD-10-CM

## 2022-05-05 DIAGNOSIS — S0083XA Contusion of other part of head, initial encounter: Secondary | ICD-10-CM | POA: Diagnosis not present

## 2022-05-05 DIAGNOSIS — S0993XA Unspecified injury of face, initial encounter: Secondary | ICD-10-CM | POA: Diagnosis present

## 2022-05-05 DIAGNOSIS — E1122 Type 2 diabetes mellitus with diabetic chronic kidney disease: Secondary | ICD-10-CM | POA: Diagnosis not present

## 2022-05-05 DIAGNOSIS — Y92019 Unspecified place in single-family (private) house as the place of occurrence of the external cause: Secondary | ICD-10-CM | POA: Diagnosis not present

## 2022-05-05 NOTE — ED Triage Notes (Signed)
PT presents to ED from home with spouse- pt reports falling this morning while ambulating with walker, fell face first, landing on left side, injuring nose, rib cage and left hand. Pt says left hand has increased in swelling through out the day. Pt walks with walker due to toe amputations, fistula noted to left arm.

## 2022-05-05 NOTE — ED Provider Notes (Incomplete)
[facility] Provider Note   CSN: [id number] Arrival date & time: 05/05/22  2243     History {Add pertinent medical, surgical, social history, OB history to HPI:1} Chief Complaint  Patient presents with  . Fall    [name]. is a 52 y.o. male.  The history is provided by the patient.  Fall       Home Medications Prior to Admission medications   Medication Sig Start Date End Date Taking? Authorizing Provider  acetaminophen (TYLENOL) 325 MG tablet Take 1-2 tablets (325-650 mg total) by mouth every 4 (four) hours as needed for mild pain. 05/04/21   Love, Ivan Anchors, PA-C  amiodarone (PACERONE) 200 MG tablet Take 1 tablet (200 mg total) by mouth every morning. 08/10/21 08/11/22  Swinyer, Lanice Schwab, NP  amLODipine (NORVASC) 10 MG tablet Take 1 tablet (10 mg total) by mouth daily. Patient taking differently: Take 5 mg by mouth daily. 02/28/22   Jennye Boroughs, MD  atorvastatin (LIPITOR) 80 MG tablet Take 1 tablet (80 mg total) by mouth daily. 05/04/21 04/29/22  Love, Ivan Anchors, PA-C  calcium acetate (PHOSLO) 667 MG capsule Take 2 capsules (1,334 mg total) by mouth 3 (three) times daily with meals. 05/04/21   Love, Ivan Anchors, PA-C  carvedilol (COREG) 25 MG tablet Take 25 mg by mouth 2 (two) times daily. 06/16/21   [provider]  cloNIDine (CATAPRES) 0.3 MG tablet Take 1 tablet (0.3 mg total) by mouth every 8 (eight) hours. 12/03/21   Bonnielee Haff, MD  diphenoxylate-atropine (LOMOTIL) 2.5-0.025 MG tablet Take as needed for diarrhea. Can take up to 4 times daily. 03/05/22   [provider]  dorzolamide-timolol (COSOPT) 22.3-6.8 MG/ML ophthalmic solution Place 1 drop into the right eye 2 (two) times daily.    [provider]  hydrALAZINE (APRESOLINE) 25 MG tablet Take 25 mg by mouth 3 (three) times daily. 03/30/22   [provider]  levETIRAcetam (KEPPRA) 250 MG tablet Take 1 tablet (250 mg total) by mouth  Every Tuesday,Thursday,and Saturday with dialysis. 04/08/22   Murlean Iba, MD  levETIRAcetam (KEPPRA) 500 MG tablet Take 1 tablet (500 mg total) by mouth daily after breakfast. 04/08/22   Wynetta Emery, Clanford L, MD  levETIRAcetam (KEPPRA) 750 MG tablet Take 1 tablet (750 mg total) by mouth at bedtime. 04/08/22   Johnson, Clanford L, MD  lidocaine-prilocaine (EMLA) cream Apply topically. 12/24/21   [provider]  nitroGLYCERIN (NITROSTAT) 0.4 MG SL tablet Place 1 tablet (0.4 mg total) under the tongue every 5 (five) minutes as needed for chest pain. 03/24/22 06/22/22  Jettie Booze, MD      Allergies    Promethazine, Promethazine hcl, and Other    Review of Systems   Review of Systems  All other systems reviewed and are negative.   Physical Exam Updated Vital Signs BP (!) 179/78 (BP Location: Right Arm)   Pulse (!) 57   Temp 98.2 F (36.8 C) (Oral)   Resp 17   Ht '6\' 3"'$  (1.905 m)   Wt 81.6 kg   SpO2 97%   BMI 22.50 kg/m  Physical Exam Vitals and nursing note reviewed.     ED Results / Procedures / Treatments   Labs (all labs ordered are listed, but only abnormal results are displayed) Labs Reviewed - No data to display  EKG None  Radiology No results found.  Procedures Procedures  {Document cardiac monitor, telemetry assessment procedure when appropriate:1}  Medications  Ordered in ED Medications - No data to display  ED Course/ Medical Decision Making/ A&P   {   Click here for ABCD2, HEART and other calculatorsREFRESH Note before signing :1}                          Medical Decision Making Amount and/or Complexity of Data Reviewed Radiology: ordered.   ***  {Document critical care time when appropriate:1} {Document review of labs and clinical decision tools ie heart score, Chads2Vasc2 etc:1}  {Document your independent review of radiology images, and any outside records:1} {Document your discussion with family members, caretakers, and  with consultants:1} {Document social determinants of health affecting pt's care:1} {Document your decision making why or why not admission, treatments were needed:1} Final Clinical Impression(s) / ED Diagnoses Final diagnoses:  None    Rx / DC Orders ED Discharge Orders     None

## 2022-05-05 NOTE — ED Provider Notes (Signed)
[facility] Provider Note   CSN: [id number] Arrival date & time: 05/05/22  2243     History  Chief Complaint  Patient presents with   Fall    [name]. is a 52 y.o. male.  The history is provided by the patient.  Fall  He has history of hypertension, diabetes, hyperlipidemia, end-stage renal disease on hemodialysis, heart failure, GERD, coronary artery disease, stroke, gastroparesis and comes in following a fall at home.  He fell on his left side.  He is complaining of pain in his left rib cage and his left hand and also states he hit his face.  He denies loss of consciousness.   Home Medications Prior to Admission medications   Medication Sig Start Date End Date Taking? Authorizing Provider  acetaminophen (TYLENOL) 325 MG tablet Take 1-2 tablets (325-650 mg total) by mouth every 4 (four) hours as needed for mild pain. 05/04/21   Love, Ivan Anchors, PA-C  amiodarone (PACERONE) 200 MG tablet Take 1 tablet (200 mg total) by mouth every morning. 08/10/21 08/11/22  Swinyer, Lanice Schwab, NP  amLODipine (NORVASC) 10 MG tablet Take 1 tablet (10 mg total) by mouth daily. Patient taking differently: Take 5 mg by mouth daily. 02/28/22   Jennye Boroughs, MD  atorvastatin (LIPITOR) 80 MG tablet Take 1 tablet (80 mg total) by mouth daily. 05/04/21 04/29/22  Love, Ivan Anchors, PA-C  calcium acetate (PHOSLO) 667 MG capsule Take 2 capsules (1,334 mg total) by mouth 3 (three) times daily with meals. 05/04/21   Love, Ivan Anchors, PA-C  carvedilol (COREG) 25 MG tablet Take 25 mg by mouth 2 (two) times daily. 06/16/21   [provider]  cloNIDine (CATAPRES) 0.3 MG tablet Take 1 tablet (0.3 mg total) by mouth every 8 (eight) hours. 12/03/21   Bonnielee Haff, MD  diphenoxylate-atropine (LOMOTIL) 2.5-0.025 MG tablet Take as needed for diarrhea. Can take up to 4 times daily. 03/05/22   [provider]  dorzolamide-timolol (COSOPT) 22.3-6.8 MG/ML  ophthalmic solution Place 1 drop into the right eye 2 (two) times daily.    [provider]  hydrALAZINE (APRESOLINE) 25 MG tablet Take 25 mg by mouth 3 (three) times daily. 03/30/22   [provider]  levETIRAcetam (KEPPRA) 250 MG tablet Take 1 tablet (250 mg total) by mouth Every Tuesday,Thursday,and Saturday with dialysis. 04/08/22   Murlean Iba, MD  levETIRAcetam (KEPPRA) 500 MG tablet Take 1 tablet (500 mg total) by mouth daily after breakfast. 04/08/22   Wynetta Emery, Clanford L, MD  levETIRAcetam (KEPPRA) 750 MG tablet Take 1 tablet (750 mg total) by mouth at bedtime. 04/08/22   Johnson, Clanford L, MD  lidocaine-prilocaine (EMLA) cream Apply topically. 12/24/21   [provider]  nitroGLYCERIN (NITROSTAT) 0.4 MG SL tablet Place 1 tablet (0.4 mg total) under the tongue every 5 (five) minutes as needed for chest pain. 03/24/22 06/22/22  Jettie Booze, MD      Allergies    Promethazine, Promethazine hcl, and Other    Review of Systems   Review of Systems  All other systems reviewed and are negative.   Physical Exam Updated Vital Signs BP (!) 179/78 (BP Location: Right Arm)   Pulse (!) 57   Temp 98.2 F (36.8 C) (Oral)   Resp 17   Ht '6\' 3"'$  (1.905 m)   Wt 81.6 kg   SpO2 97%   BMI 22.50 kg/m  Physical Exam Vitals and nursing note reviewed.  52 year old male, resting comfortably and in no acute distress. Vital signs are significant for elevated blood pressure and borderline slow heart rate. Oxygen saturation is 97%, which is normal. Head is normocephalic.  Status post enucleation of left eye.  Right pupil has full range of extraocular movements and response to light.  Mild swelling noted of the nose without deformity.  Superficial laceration present over the bridge of the nose. Neck is nontender without adenopathy or JVD. Back is nontender and there is no CVA tenderness. Lungs are clear without rales, wheezes, or rhonchi. Chest is tender diffusely  through the left lateral chest wall.  There is no crepitus. Heart has regular rate and rhythm without murmur. Abdomen is soft, flat, nontender. Extremities: There is moderate soft tissue swelling of the left hand with tenderness diffusely.  No tenderness over the left wrist or forearm or elbow or upper arm.  AV fistula is present in the left upper arm with thrill present.  There is 2-3+ pretibial and pedal edema bilaterally. Skin is warm and dry without rash. Neurologic: Mental status is normal, cranial nerves are intact, moves all extremities equally.  ED Results / Procedures / Treatments    Radiology DG Hand Complete Left  Result Date: 05/06/2022 CLINICAL DATA:  Fall with pain in the left hip EXAM: LEFT HAND - COMPLETE 3+ VIEW COMPARISON:  None Available. FINDINGS: Extensive vascular calcifications. No fracture or malalignment. Previous amputation of third digit at the level of the D IP joint. Dorsal soft tissue swelling. IMPRESSION: 1. No acute osseous abnormality. Dorsal soft tissue swelling. 2. Extensive vascular calcifications. Previous amputation of third digit at the level of the distal IP joint. Electronically Signed   By: Donavan Foil M.D.   On: 05/06/2022 00:15   DG Ribs Unilateral W/Chest Left  Result Date: 05/06/2022 CLINICAL DATA:  Fall with left-sided rib pain EXAM: LEFT RIBS AND CHEST - 3+ VIEW COMPARISON:  04/06/2022 FINDINGS: Single-view chest demonstrates globular cardiac enlargement similar compared to previous. No acute airspace disease. No visible pneumothorax. Left rib series demonstrates no acute displaced left rib fracture. IMPRESSION: No acute displaced left rib fracture. Globular cardiac enlargement similar compared to previous, likely combination of cardiomegaly and pericardial effusion. Electronically Signed   By: Donavan Foil M.D.   On: 05/06/2022 00:13   CT Maxillofacial Wo Contrast  Result Date: 05/06/2022 CLINICAL DATA:  Facial trauma, blunt. Fall landing on  left side of face and injuring nose. EXAM: CT MAXILLOFACIAL WITHOUT CONTRAST TECHNIQUE: Multidetector CT imaging of the maxillofacial structures was performed. Multiplanar CT image reconstructions were also generated. RADIATION DOSE REDUCTION: This exam was performed according to the departmental dose-optimization program which includes automated exposure control, adjustment of the mA and/or kV according to patient size and/or use of iterative reconstruction technique. COMPARISON:  None Available. FINDINGS: Osseous: No fracture or mandibular dislocation. No destructive process. Orbits: The right orbit is within normal limits. There is a prosthetic globe on the left. Sinuses: Minimal mucosal thickening in the maxillary sinuses. No air-fluid levels. Soft tissues: Mild soft tissue swelling is noted over the nose and forehead. Limited intracranial: Atrophy is noted. Periventricular white matter hypodensities are present bilaterally. No hydrocephalus. IMPRESSION: No acute fracture. Electronically Signed   By: Brett Fairy M.D.   On: 05/06/2022 00:07    Procedures Procedures    Medications Ordered in ED Medications - No data to display  ED Course/ Medical Decision Making/ A&P  Medical Decision Making Amount and/or Complexity of Data Reviewed Radiology: ordered.   Fall with injury to face, left-sided ribs, left hand.  I have ordered x-rays of left hand, left ribs and CTs scan of maxillofacial bones.  X-rays show no evidence of fractures.  I have independently viewed the images, and agree with radiologist's interpretation.  I have advised the patient to apply ice, use over-the-counter NSAIDs and acetaminophen as needed for pain.  Final Clinical Impression(s) / ED Diagnoses Final diagnoses:  Fall at home, initial encounter  Contusion of face, initial encounter  Chest wall contusion, left, initial encounter  Contusion of left hand, initial encounter    Rx / DC  Orders ED Discharge Orders     None         Delora Fuel, MD [id number]

## 2022-05-06 NOTE — Discharge Instructions (Signed)
Apply ice for 30 minutes at a time, 4 times a day.  Take ibuprofen and/or acetaminophen as needed for pain.  Please be aware that if you combine ibuprofen and acetaminophen, you will get better pain relief and you get from taking either medication by itself.

## 2022-05-07 ENCOUNTER — Other Ambulatory Visit (HOSPITAL_BASED_OUTPATIENT_CLINIC_OR_DEPARTMENT_OTHER): Payer: Self-pay | Admitting: Family

## 2022-05-25 ENCOUNTER — Ambulatory Visit: Payer: Medicare HMO

## 2022-05-25 ENCOUNTER — Ambulatory Visit: Payer: Medicare HMO | Admitting: Podiatry

## 2022-05-25 DIAGNOSIS — L97422 Non-pressure chronic ulcer of left heel and midfoot with fat layer exposed: Secondary | ICD-10-CM

## 2022-05-25 DIAGNOSIS — L853 Xerosis cutis: Secondary | ICD-10-CM

## 2022-05-25 DIAGNOSIS — M79674 Pain in right toe(s): Secondary | ICD-10-CM | POA: Diagnosis not present

## 2022-05-25 DIAGNOSIS — B351 Tinea unguium: Secondary | ICD-10-CM

## 2022-05-25 DIAGNOSIS — R52 Pain, unspecified: Secondary | ICD-10-CM

## 2022-05-25 DIAGNOSIS — M79675 Pain in left toe(s): Secondary | ICD-10-CM | POA: Diagnosis not present

## 2022-05-25 MED ORDER — AMMONIUM LACTATE 12 % EX CREA: 1.0000 | TOPICAL_CREAM | CUTANEOUS | 0 refills | Status: DC | PRN

## 2022-05-25 MED ORDER — SILVER SULFADIAZINE 1 % EX CREA: 1.0000 | TOPICAL_CREAM | Freq: Every day | CUTANEOUS | 0 refills | Status: DC

## 2022-05-26 NOTE — Progress Notes (Signed)
Subjective: Chief Complaint  Patient presents with   Foot Pain    Pain located in bilateral heels, patient says that pain is intermittent within the last 3 weeks, pain has become better but still occurs    52 year old male presents the office today with concerns to his heels.  His wife was trimming some dead skin off the heel and created an ulcer of the left heel.  Is been getting better but still present and is not as tender as what it was.  Denies any drainage or pus.  No injuries otherwise.  Also get his nails trimmed today as are thickened elongated he did not do them himself.  Objective: AAO x3, NAD DP/PT pulses palpable bilaterally, CRT less than 3 seconds-foot is warm and well perfused. On the posterior aspect of the left heel is hyperkeratotic lesion with central ulceration measuring 0.5 x 0.5 x 0.3 cm without any probing, or tunneling.  There is no surrounding erythema, ascending cellulitis.  No drainage or pus.  There is no fluctuance or crepitation.  There is no malodor.  No other open lesions are present but there is dry skin present bilaterally.   Nails are hypertrophic, dystrophic with brown discoloration 2 through 5 on the left and 1 through 4 on the right.  No edema, erythema or signs of infection. No pain with calf compression, swelling, warmth, erythema  Assessment: Left heel ulceration, symptomatic onychomycosis  Plan: -All treatment options discussed with the patient including all alternatives, risks, complications.  -X-rays were obtained and reviewed bilaterally.  Status post left hallux amputation.  No evidence of acute osteomyelitis particularly along the heel.  Vessel calcifications present.  Status post fifth toe amputation on the left. -Medically necessary wound debridement performed left heel.  Prior to debridement the wound was mostly covered with callus and only a pinpoint opening after debridement the wound measurements are above.  I sharply debrided the wound with  #312 with scalpel to healthy tissue removed viable devitalized tissue promote wound healing.  There was no blood loss.  Cleaned with saline.  Silvadene was applied followed by dressing.  Recommend daily dressing changes, offloading. -No significant swelling.  Patient report.  If no improvement arterial studies. -Amlactin for dry skin -Sharply debrided nails x 8 without complications or bleeding  Return in about 2 weeks (around 06/08/2022).  Trula Slade DPM

## 2022-06-01 ENCOUNTER — Encounter (INDEPENDENT_AMBULATORY_CARE_PROVIDER_SITE_OTHER): Payer: Self-pay | Admitting: Nurse Practitioner

## 2022-06-01 ENCOUNTER — Other Ambulatory Visit (INDEPENDENT_AMBULATORY_CARE_PROVIDER_SITE_OTHER): Payer: Self-pay | Admitting: Nurse Practitioner

## 2022-06-01 DIAGNOSIS — N186 End stage renal disease: Secondary | ICD-10-CM

## 2022-06-01 DIAGNOSIS — M79601 Pain in right arm: Secondary | ICD-10-CM

## 2022-06-02 ENCOUNTER — Encounter: Payer: Self-pay | Admitting: Neurology

## 2022-06-02 ENCOUNTER — Ambulatory Visit: Payer: Medicare HMO | Admitting: Neurology

## 2022-06-08 ENCOUNTER — Emergency Department (HOSPITAL_COMMUNITY): Payer: Medicare HMO

## 2022-06-08 ENCOUNTER — Other Ambulatory Visit: Payer: Self-pay

## 2022-06-08 ENCOUNTER — Emergency Department (HOSPITAL_COMMUNITY)
Admission: EM | Admit: 2022-06-08 | Discharge: 2022-06-08 | Disposition: A | Discharge status: Home / Self Care | Payer: Medicare HMO | Attending: Emergency Medicine | Admitting: Emergency Medicine

## 2022-06-08 ENCOUNTER — Encounter (HOSPITAL_COMMUNITY): Payer: Self-pay | Admitting: *Deleted

## 2022-06-08 DIAGNOSIS — I120 Hypertensive chronic kidney disease with stage 5 chronic kidney disease or end stage renal disease: Secondary | ICD-10-CM | POA: Diagnosis not present

## 2022-06-08 DIAGNOSIS — Z992 Dependence on renal dialysis: Secondary | ICD-10-CM | POA: Diagnosis not present

## 2022-06-08 DIAGNOSIS — N186 End stage renal disease: Secondary | ICD-10-CM | POA: Diagnosis not present

## 2022-06-08 DIAGNOSIS — R531 Weakness: Secondary | ICD-10-CM | POA: Diagnosis not present

## 2022-06-08 DIAGNOSIS — Z79899 Other long term (current) drug therapy: Secondary | ICD-10-CM | POA: Diagnosis not present

## 2022-06-08 DIAGNOSIS — R4182 Altered mental status, unspecified: Secondary | ICD-10-CM | POA: Insufficient documentation

## 2022-06-08 LAB — COMPREHENSIVE METABOLIC PANEL
ALT: 12 U/L (ref 0–44)
AST: 9 U/L — ABNORMAL LOW (ref 15–41)
Albumin: 3.7 g/dL (ref 3.5–5.0)
Alkaline Phosphatase: 82 U/L (ref 38–126)
Anion gap: 14 (ref 5–15)
BUN: 78 mg/dL — ABNORMAL HIGH (ref 6–20)
CO2: 14 mmol/L — ABNORMAL LOW (ref 22–32)
Calcium: 8.5 mg/dL — ABNORMAL LOW (ref 8.9–10.3)
Chloride: 111 mmol/L (ref 98–111)
Creatinine, Ser: 12.98 mg/dL — ABNORMAL HIGH (ref 0.61–1.24)
GFR, Estimated: 4 mL/min — ABNORMAL LOW (ref >60)
Glucose, Bld: 51 mg/dL — ABNORMAL LOW (ref 70–99)
Potassium: 3.8 mmol/L (ref 3.5–5.1)
Sodium: 139 mmol/L (ref 135–145)
Total Bilirubin: 0.9 mg/dL (ref 0.3–1.2)
Total Protein: 6.9 g/dL (ref 6.5–8.1)

## 2022-06-08 LAB — CBC WITH DIFFERENTIAL/PLATELET
Abs Immature Granulocytes: 0.06 10*3/uL (ref 0.00–0.07)
Basophils Absolute: 0.1 10*3/uL (ref 0.0–0.1)
Basophils Relative: 1 %
Eosinophils Absolute: 0.4 10*3/uL (ref 0.0–0.5)
Eosinophils Relative: 6 %
HCT: 37.4 % — ABNORMAL LOW (ref 39.0–52.0)
Hemoglobin: 11.7 g/dL — ABNORMAL LOW (ref 13.0–17.0)
Immature Granulocytes: 1 %
Lymphocytes Relative: 15 %
Lymphs Abs: 0.9 10*3/uL (ref 0.7–4.0)
MCH: 22.2 pg — ABNORMAL LOW (ref 26.0–34.0)
MCHC: 31.3 g/dL (ref 30.0–36.0)
MCV: 70.8 fL — ABNORMAL LOW (ref 80.0–100.0)
Monocytes Absolute: 0.9 10*3/uL (ref 0.1–1.0)
Monocytes Relative: 13 %
Neutro Abs: 4.2 10*3/uL (ref 1.7–7.7)
Neutrophils Relative %: 64 %
Platelets: 105 10*3/uL — ABNORMAL LOW (ref 150–400)
RBC: 5.28 MIL/uL (ref 4.22–5.81)
RDW: 24.7 % — ABNORMAL HIGH (ref 11.5–15.5)
WBC: 6.4 10*3/uL (ref 4.0–10.5)
nRBC: 1.4 % — ABNORMAL HIGH (ref 0.0–0.2)

## 2022-06-08 LAB — CBG MONITORING, ED
Glucose-Capillary: 151 mg/dL — ABNORMAL HIGH (ref 70–99)
Glucose-Capillary: 146 mg/dL — ABNORMAL HIGH (ref 70–99)

## 2022-06-08 NOTE — ED Triage Notes (Signed)
Pt in from [facility] on [address] via [facility], per report pt was altered during treatment, pt rcvd 2 hours of tx, pt rcvd 250 mL bolus at HD, per EMS pts CBG 66 and he rcvd 15 grams of oral glucose, CBG did not improve, pt A&O x 4, pt hypertensive pta BP 200/100, per HD the pts BP normally is high, pt reported that he missed his HD tx on Saturday

## 2022-06-08 NOTE — Discharge Instructions (Addendum)
As discussed, your evaluation today has been largely reassuring.  But, it is important that you monitor your condition carefully, and do not hesitate to return to the ED if you develop new, or concerning changes in your condition. ? ?Otherwise, please follow-up with your physician for appropriate ongoing care. ? ?

## 2022-06-08 NOTE — ED Notes (Signed)
This RN called lab to inquire on results, the labs were collected, this RN spoke with the phlebotomist and the labs are being taken to be processed now, will update pt and family when results are available

## 2022-06-08 NOTE — ED Notes (Signed)
Dc instructions reviewed with pt. Pt wheeled to lobby to wait for wife to transport home.

## 2022-06-08 NOTE — ED Provider Notes (Signed)
[facility] AT [facility] Provider Note   CSN: [id number] Arrival date & time: 06/08/22  4098     History  Chief Complaint  Patient presents with   Hypoglycemia    [name]. is a 52 y.o. male.  HPI Patient presents from dialysis via EMS after an episode of altered mental status.  Per EMS the patient was undergoing dialysis, after having missed his most recent session.  At dialysis he was already listless, hypoglycemic, and had slow improvement after receiving IV dextrose. Patient also noted to be hypertensive there.  Currently patient denies complaints, states that he feels normal, denies pain, dyspnea, acknowledges an episode that he is unsure about earlier in the day.  He also acknowledges that he missed a dialysis session on Saturday, last dialysis 5 days ago.  Patient receives dialysis Tuesday, Thursday, Saturday at [facility] dialysis center.     Home Medications Prior to Admission medications   Medication Sig Start Date End Date Taking? Authorizing Provider  levETIRAcetam (KEPPRA) 500 MG tablet Take 1 tablet (500 mg total) by mouth daily after breakfast. 04/08/22  Yes Johnson, Clanford L, MD  levETIRAcetam (KEPPRA) 750 MG tablet Take 1 tablet (750 mg total) by mouth at bedtime. 04/08/22  Yes Johnson, Clanford L, MD  acetaminophen (TYLENOL) 325 MG tablet Take 1-2 tablets (325-650 mg total) by mouth every 4 (four) hours as needed for mild pain. 05/04/21   Love, Evlyn Kanner, PA-C  amiodarone (PACERONE) 200 MG tablet Take 1 tablet (200 mg total) by mouth every morning. 08/10/21 08/11/22  Swinyer, Zachary George, NP  amLODipine (NORVASC) 10 MG tablet Take 1 tablet (10 mg total) by mouth daily. Patient taking differently: Take 5 mg by mouth daily. 02/28/22   Lurene Shadow, MD  ammonium lactate (AMLACTIN) 12 % cream Apply 1 Application topically as needed for dry skin. 05/25/22   Vivi Barrack, DPM  atorvastatin (LIPITOR) 80 MG tablet TAKE ONE TABLET BY  MOUTH AT BEDTIME. 05/07/22   Corky Crafts, MD  calcium acetate (PHOSLO) 667 MG capsule Take 2 capsules (1,334 mg total) by mouth 3 (three) times daily with meals. 05/04/21   Love, Evlyn Kanner, PA-C  carvedilol (COREG) 25 MG tablet Take 25 mg by mouth 2 (two) times daily. 06/16/21   [provider]  cloNIDine (CATAPRES) 0.3 MG tablet Take 1 tablet (0.3 mg total) by mouth every 8 (eight) hours. 12/03/21   Osvaldo Shipper, MD  diphenoxylate-atropine (LOMOTIL) 2.5-0.025 MG tablet Take as needed for diarrhea. Can take up to 4 times daily. 03/05/22   [provider]  dorzolamide-timolol (COSOPT) 22.3-6.8 MG/ML ophthalmic solution Place 1 drop into the right eye 2 (two) times daily.    [provider]  hydrALAZINE (APRESOLINE) 25 MG tablet Take 25 mg by mouth 3 (three) times daily. 03/30/22   [provider]  levETIRAcetam (KEPPRA) 250 MG tablet Take 1 tablet (250 mg total) by mouth Every Tuesday,Thursday,and Saturday with dialysis. 04/08/22   Johnson, Clanford L, MD  lidocaine-prilocaine (EMLA) cream Apply topically. 12/24/21   [provider]  nitroGLYCERIN (NITROSTAT) 0.4 MG SL tablet Place 1 tablet (0.4 mg total) under the tongue every 5 (five) minutes as needed for chest pain. 03/24/22 06/22/22  Corky Crafts, MD  silver sulfADIAZINE (SILVADENE) 1 % cream Apply 1 Application topically daily. 05/25/22   Vivi Barrack, DPM      Allergies    Promethazine, Promethazine hcl, and Other    Review of Systems  Review of Systems  All other systems reviewed and are negative.   Physical Exam Updated Vital Signs BP (!) 182/73   Pulse 79   Temp 97.8 F (36.6 C)   Resp 14   Ht 6\' 3"  (1.905 m)   Wt 81.6 kg   SpO2 99%   BMI 22.50 kg/m  Physical Exam Vitals and nursing note reviewed.  Constitutional:      General: He is not in acute distress.    Appearance: He is well-developed.  HENT:     Head: Normocephalic and atraumatic.  Eyes:     Comments:  Eyepatch left eye  Cardiovascular:     Rate and Rhythm: Normal rate and regular rhythm.  Pulmonary:     Effort: Pulmonary effort is normal. No respiratory distress.     Breath sounds: No stridor.  Abdominal:     General: There is no distension.  Musculoskeletal:     Comments: Left upper arm dialysis access unremarkable  Skin:    General: Skin is warm and dry.  Neurological:     Mental Status: He is alert and oriented to person, place, and time.     ED Results / Procedures / Treatments   Labs (all labs ordered are listed, but only abnormal results are displayed) Labs Reviewed  CBC WITH DIFFERENTIAL/PLATELET - Abnormal; Notable for the following components:      Result Value   Hemoglobin 11.7 (*)    HCT 37.4 (*)    MCV 70.8 (*)    MCH 22.2 (*)    RDW 24.7 (*)    Platelets 105 (*)    nRBC 1.4 (*)    All other components within normal limits  CBG MONITORING, ED - Abnormal; Notable for the following components:   Glucose-Capillary 151 (*)    All other components within normal limits  CBG MONITORING, ED - Abnormal; Notable for the following components:   Glucose-Capillary 146 (*)    All other components within normal limits  COMPREHENSIVE METABOLIC PANEL    EKG EKG Interpretation  Date/Time:  Tuesday June 08 2022 09:46:41 EDT Ventricular Rate:  76 PR Interval:  182 QRS Duration: 121 QT Interval:  435 QTC Calculation: 490 R Axis:   25 Text Interpretation: Sinus rhythm LVH with secondary repolarization abnormality Anterior ST elevation, probably due to LVH Borderline prolonged QT interval Abnormal ECG Confirmed by Gerhard Munch [phone]) on 06/08/2022 10:29:32 AM  Radiology DG Chest Port 1 View  Result Date: 06/08/2022 CLINICAL DATA:  Altered mental status EXAM: PORTABLE CHEST 1 VIEW COMPARISON:  X-ray 05/05/2022 and older.  CT chest 11/21/2021 FINDINGS: Enlarged cardiopericardial silhouette. No consolidation, pneumothorax or effusion. No edema. Overlapping cardiac  leads. IMPRESSION: Enlarged cardiopericardial silhouette. Patient has a history of a known pericardial effusion. No acute cardiopulmonary disease Electronically Signed   By: Karen Kays M.D.   On: 06/08/2022 10:34    Procedures Procedures    Medications Ordered in ED Medications - No data to display  ED Course/ Medical Decision Making/ A&P                             Medical Decision Making With multiple medical problems including end-stage renal disease, hypertension presents from dialysis after episode of listlessness, hypoglycemia, hypertension. Suspicion for dialysis related phenomena as the patient currently has no complaints, is mildly hypertensive, but otherwise hemodynamically unremarkable.  Patient is not known to be diabetic, and will require monitoring, labs.  No early  evidence for infection or other acute phenomena. Cardiac 75 sinus normal Pulse ox 100% room air normal   Amount and/or Complexity of Data Reviewed Independent Historian: EMS External Data Reviewed: notes. Labs: ordered. Decision-making details documented in ED Course. Radiology: ordered and independent interpretation performed. Decision-making details documented in ED Course. ECG/medicine tests: ordered and independent interpretation performed. Decision-making details documented in ED Course.   12:35 PM Patient in no distress, calm labs reviewed, patient monitored for hours, no substantial abnormalities.  He is mildly hypertensive, but has a history of this.  ECG without alarming findings, arrhythmia, ischemia x-ray without new disease.  Patient appropriate for discharge with anticipated dialysis tomorrow.          Final Clinical Impression(s) / ED Diagnoses Final diagnoses:  Weakness     Gerhard Munch, MD 06/08/22 1241

## 2022-06-09 ENCOUNTER — Encounter (INDEPENDENT_AMBULATORY_CARE_PROVIDER_SITE_OTHER): Payer: Medicare HMO

## 2022-06-09 ENCOUNTER — Ambulatory Visit (INDEPENDENT_AMBULATORY_CARE_PROVIDER_SITE_OTHER): Payer: Medicare HMO | Admitting: Nurse Practitioner

## 2022-06-10 ENCOUNTER — Ambulatory Visit: Payer: Medicare HMO | Admitting: Podiatry

## 2022-06-18 ENCOUNTER — Ambulatory Visit (INDEPENDENT_AMBULATORY_CARE_PROVIDER_SITE_OTHER): Payer: Medicare HMO

## 2022-06-18 ENCOUNTER — Ambulatory Visit (INDEPENDENT_AMBULATORY_CARE_PROVIDER_SITE_OTHER): Payer: Medicare HMO | Admitting: Nurse Practitioner

## 2022-06-18 ENCOUNTER — Encounter (INDEPENDENT_AMBULATORY_CARE_PROVIDER_SITE_OTHER): Payer: Medicare HMO

## 2022-06-18 ENCOUNTER — Encounter (INDEPENDENT_AMBULATORY_CARE_PROVIDER_SITE_OTHER): Payer: Self-pay | Admitting: Nurse Practitioner

## 2022-06-18 VITALS — BP 156/78 | HR 69 | Resp 16

## 2022-06-18 DIAGNOSIS — M79601 Pain in right arm: Secondary | ICD-10-CM

## 2022-06-18 DIAGNOSIS — I10 Essential (primary) hypertension: Secondary | ICD-10-CM | POA: Diagnosis not present

## 2022-06-18 DIAGNOSIS — N186 End stage renal disease: Secondary | ICD-10-CM

## 2022-06-18 DIAGNOSIS — Z794 Long term (current) use of insulin: Secondary | ICD-10-CM | POA: Diagnosis not present

## 2022-06-18 DIAGNOSIS — E1165 Type 2 diabetes mellitus with hyperglycemia: Secondary | ICD-10-CM

## 2022-07-05 ENCOUNTER — Encounter (INDEPENDENT_AMBULATORY_CARE_PROVIDER_SITE_OTHER): Payer: Self-pay | Admitting: Nurse Practitioner

## 2022-07-05 NOTE — Progress Notes (Unsigned)
Subjective:    Patient ID: [name] Barns., male    DOB: 16-Oct-1970, 52 y.o.   MRN: [id number] No chief complaint on file.   HPI  Review of Systems     Objective:   Physical Exam  BP (!) 156/78 (BP Location: Right Arm)   Pulse 69   Resp 16   Past Medical History:  Diagnosis Date  . Anxiety    a.) on BZO (clonazepam) PRN  . Aortic dilatation (HCC) 11/25/2020   a.) TTE 11/25/2020: Ao root measured 38 mm. b.) TTE 04/22/2021: Ao root measured 44 mm; c. 11/2021 Echo: Ao root 40mm.  . Atrial fibrillation (HCC)    a.) CHA2DS2-VASc = 6 (CHF, HTN, CVA x2, prior MI, T2DM). b.) rate/rhythm maintained on oral amiodarone + carvedilol; chronically anticoagulated using dose reduced apixaban + clopidogrel  . BPH (benign prostatic hyperplasia)   . Cardiac arrest (HCC) 07/26/2021   a.) in setting of hyperkalemia, NSTEMI, and seizure following missed HD; pulseless and apneic --> required 1 round of CPR prior to ROSC  . Cellulitis   . Cerebral hemorrhage (HCC)    a. 10/2021 interventricular hemorrhage in setting of HTN emergency and DOAC/APT-->eliquis/plavix d/c'd.  . Chronic diarrhea   . Chronic heart failure with preserved ejection fraction (HFpEF) (HCC) 07/19/2017   a. 06/2017 Echo: EF 75%; b. 11/2020 Echo: EF 55-60%; c. 04/2021 Echo: EF 55-60%; d. 11/2021 Echo: EF 65-70%, GrII DD, nl RV fxn, sev dil LA, large circumferential pericardial eff w/o tamponade, triv MR, AoV sclerosis. Ao root 40mm.  . Chronic pain of both ankles   . Coronary artery disease 02/26/2020   a. 02/2020 Ant STEMI/PCI: LM nl, LAD 22m (3.5x30 Resolute Onyx), RI nl, LCX min irregs, RCA min irregs. EF 65%.  . Diabetes mellitus, type II (HCC)   . Diabetic neuropathy (HCC)   . Esophagitis   . ESRD (end stage renal disease) on dialysis [facility])    a.) [facility]; T-Th-Sat  . Frequent falls   . Gait instability   . Gastritis   . Gastroparesis   . GERD (gastroesophageal reflux disease)   . H/O enucleation of left eyeball   .  Heart palpitations   . History of kidney stones   . HLD (hyperlipidemia)   . Hypertension   . Insomnia    a.) uses melatonin PRN  . Nausea and vomiting in adult    recurrent  . NSTEMI (non-ST elevated myocardial infarction) (HCC) 07/26/2021   a.) hyperkalemic from missed HD; seizures; decompensated to cardiac arrest and required CPR x 1 round prior to ROSC.  . Osteomyelitis (HCC)   . Pericardial effusion    a. 11/2021 Echo: EF 65-70%, GrII DD, nl RV fxn, sev dil LA, large circumferential pericardial eff w/o tamponade, triv MR, AoV sclerosis. Ao root 40mm.  . Seizure (HCC)    a.) last 07/26/2021 in setting of missed HD --> hyperkalemic at 6.5 --> pulseless/apneic and required CPR; discharged home on levetiracetam.  . ST elevation myocardial infarction (STEMI) of anterior wall (HCC) 02/26/2020   a.) LHC/PCI 02/26/2020 --> EF 65%; LVEDP 11 mmHg; 90% mLAD (3.5 x 20 mm Resolute Onyx DES x 1)  . Stroke [facility]) 04/2021   a.) documented as x 3 between 04/2021 and 06/2021    Social History   Socioeconomic History  . Marital status: Married    Spouse name: Martie Lee   . Number of children: 2  . Years of education: Not on file  . Highest education level: High school  graduate  Occupational History  . Occupation: Disability    Comment: not employed  Tobacco Use  . Smoking status: Never  . Smokeless tobacco: Never  Vaping Use  . Vaping Use: Never used  Substance and Sexual Activity  . Alcohol use: No  . Drug use: No  . Sexual activity: Yes  Other Topics Concern  . Not on file  Social History Narrative   Oldest son killed in car crash June 2020.  Lives in [place] with his wife.   Social Determinants of Health   Financial Resource Strain: High Risk (07/05/2017)   Overall Financial Resource Strain (CARDIA)   . Difficulty of Paying Living Expenses: Hard  Food Insecurity: No Food Insecurity (02/28/2022)   Hunger Vital Sign   . Worried About Programme researcher, broadcasting/film/video in the Last Year:  Never true   . Ran Out of Food in the Last Year: Never true  Transportation Needs: No Transportation Needs (02/28/2022)   PRAPARE - Transportation   . Lack of Transportation (Medical): No   . Lack of Transportation (Non-Medical): No  Physical Activity: Inactive (07/05/2017)   Exercise Vital Sign   . Days of Exercise per Week: 0 days   . Minutes of Exercise per Session: 0 min  Stress: Stress Concern Present (07/05/2017)   [employer] of Occupational Health - Occupational Stress Questionnaire   . Feeling of Stress : Rather much  Social Connections: Moderately Integrated (07/05/2017)   Social Connection and Isolation Panel [NHANES]   . Frequency of Communication with Friends and Family: Three times a week   . Frequency of Social Gatherings with Friends and Family: Once a week   . Attends Religious Services: More than 4 times per year   . Active Member of Clubs or Organizations: No   . Attends Banker Meetings: Never   . Marital Status: Married  Catering manager Violence: Not At Risk (02/28/2022)   Humiliation, Afraid, Rape, and Kick questionnaire   . Fear of Current or Ex-Partner: No   . Emotionally Abused: No   . Physically Abused: No   . Sexually Abused: No    Past Surgical History:  Procedure Laterality Date  . A/V FISTULAGRAM Left 08/31/2021   Procedure: A/V Fistulagram;  Surgeon: Annice Needy, MD;  Location: [name] INVASIVE CV LAB;  Service: Cardiovascular;  Laterality: Left;  . A/V FISTULAGRAM Left 01/25/2022   Procedure: A/V Fistulagram;  Surgeon: Annice Needy, MD;  Location: [name] INVASIVE CV LAB;  Service: Cardiovascular;  Laterality: Left;  . AMPUTATION Left 03/16/2016   Procedure: AMPUTATION DIGIT LEFT HALLUX;  Surgeon: Vivi Barrack, DPM;  Location: [facility] OR;  Service: Podiatry;  Laterality: Left;  can start around 5   . AMPUTATION Right 02/09/2021   Procedure: AMPUTATION DIGIT;  Surgeon: Marlyne Beards, MD;  Location: [facility] OR;  Service: Orthopedics;   Laterality: Right;  . AMPUTATION TOE Right 02/28/2022   Procedure: AMPUTATION TOE;  Surgeon: Louann Sjogren, DPM;  Location: [name] ORS;  Service: Podiatry;  Laterality: Right;  right fifth toe  . ARTHROSCOPIC REPAIR ACL Left   . AV FISTULA PLACEMENT Right 05/31/2019   Procedure: Brachiocephalic AV fistula creation;  Surgeon: Annice Needy, MD;  Location: [name] ORS;  Service: Vascular;  Laterality: Right;  . AV FISTULA PLACEMENT Left 08/13/2021   Procedure: ARTERIOVENOUS (AV) FISTULA CREATION (BRACHIALCEPHALIC);  Surgeon: Annice Needy, MD;  Location: [name] ORS;  Service: Vascular;  Laterality: Left;  . COLONOSCOPY WITH PROPOFOL N/A 10/28/2015   Procedure:  COLONOSCOPY WITH PROPOFOL;  Surgeon: Christena Deem, MD;  Location: [name] ENDOSCOPY;  Service: Endoscopy;  Laterality: N/A;  . COLONOSCOPY WITH PROPOFOL N/A 10/29/2015   Procedure: COLONOSCOPY WITH PROPOFOL;  Surgeon: Christena Deem, MD;  Location: [facility] ENDOSCOPY;  Service: Endoscopy;  Laterality: N/A;  . CORONARY ULTRASOUND/IVUS N/A 02/26/2020   Procedure: Intravascular Ultrasound/IVUS;  Surgeon: Corky Crafts, MD;  Location: [facility] INVASIVE CV LAB;  Service: Cardiovascular;  Laterality: N/A;  . CORONARY/GRAFT ACUTE MI REVASCULARIZATION N/A 02/26/2020   Procedure: Coronary/Graft Acute MI Revascularization;  Surgeon: Corky Crafts, MD;  Location: [facility] INVASIVE CV LAB;  Service: Cardiovascular;  Laterality: N/A;  . DIALYSIS/PERMA CATHETER INSERTION Right 04/26/2019   Perm Cath   . DIALYSIS/PERMA CATHETER INSERTION N/A 04/26/2019   Procedure: DIALYSIS/PERMA CATHETER INSERTION;  Surgeon: Annice Needy, MD;  Location: [name] INVASIVE CV LAB;  Service: Cardiovascular;  Laterality: N/A;  . DIALYSIS/PERMA CATHETER INSERTION N/A 05/25/2021   Procedure: DIALYSIS/PERMA CATHETER INSERTION;  Surgeon: Annice Needy, MD;  Location: [name] INVASIVE CV LAB;  Service: Cardiovascular;  Laterality: N/A;  . DIALYSIS/PERMA CATHETER INSERTION N/A 08/31/2021    Procedure: DIALYSIS/PERMA CATHETER INSERTION;  Surgeon: Annice Needy, MD;  Location: [name] INVASIVE CV LAB;  Service: Cardiovascular;  Laterality: N/A;  . DIALYSIS/PERMA CATHETER REMOVAL N/A 08/15/2019   Procedure: DIALYSIS/PERMA CATHETER REMOVAL;  Surgeon: Renford Dills, MD;  Location: [name] INVASIVE CV LAB;  Service: Cardiovascular;  Laterality: N/A;  . DIALYSIS/PERMA CATHETER REMOVAL N/A 03/15/2022   Procedure: DIALYSIS/PERMA CATHETER REMOVAL;  Surgeon: Annice Needy, MD;  Location: [name] INVASIVE CV LAB;  Service: Cardiovascular;  Laterality: N/A;  . EMBOLIZATION Right 02/06/2021   Procedure: EMBOLIZATION;  Surgeon: Annice Needy, MD;  Location: [name] INVASIVE CV LAB;  Service: Cardiovascular;  Laterality: Right;  Right Upper Extremity Dialysis Access, Permcath Placement  . ESOPHAGOGASTRODUODENOSCOPY (EGD) WITH PROPOFOL N/A 12/27/2017   Procedure: ESOPHAGOGASTRODUODENOSCOPY (EGD) WITH PROPOFOL;  Surgeon: Toledo, Boykin Nearing, MD;  Location: [name] ENDOSCOPY;  Service: Gastroenterology;  Laterality: N/A;  . EYE SURGERY    . LEFT HEART CATH AND CORONARY ANGIOGRAPHY N/A 02/26/2020   Procedure: LEFT HEART CATH AND CORONARY ANGIOGRAPHY;  Surgeon: Corky Crafts, MD;  Location: [facility] INVASIVE CV LAB;  Service: Cardiovascular;  Laterality: N/A;  . PROSTATE SURGERY  2016  . TONSILECTOMY/ADENOIDECTOMY WITH MYRINGOTOMY    . TONSILLECTOMY    . UPPER EXTREMITY ANGIOGRAPHY Right 11/26/2020   Procedure: Upper Extremity Angiography;  Surgeon: Annice Needy, MD;  Location: [name] INVASIVE CV LAB;  Service: Cardiovascular;  Laterality: Right;  . UPPER EXTREMITY ANGIOGRAPHY Right 02/05/2021   Procedure: UPPER EXTREMITY ANGIOGRAPHY;  Surgeon: Annice Needy, MD;  Location: [name] INVASIVE CV LAB;  Service: Cardiovascular;  Laterality: Right;    Family History  Problem Relation Age of Onset  . CAD Father   . Stroke Father   . Diabetes Mellitus II Mother   . Kidney failure Mother   . Schizophrenia Mother      Allergies  Allergen Reactions  . Promethazine Diarrhea and Other (See Comments)    Muscle cramps  . Promethazine Hcl Other (See Comments)    Cramping all over  Other reaction(s): cramp all over  . Other     Other reaction(s): Unknown Other reaction(s): Unknown       Latest Ref Rng & Units 06/08/2022   11:29 AM 04/07/2022    4:49 AM 04/06/2022    7:51 AM  CBC  WBC 4.0 - 10.5 K/uL 6.4  5.5  6.4  Hemoglobin 13.0 - 17.0 g/dL 16.1  09.6  04.5   Hematocrit 39.0 - 52.0 % 37.4  34.4  33.3   Platelets 150 - 400 K/uL 105  151  161       CMP     Component Value Date/Time   NA 139 06/08/2022 1129   K 3.8 06/08/2022 1129   CL 111 06/08/2022 1129   CO2 14 (L) 06/08/2022 1129   GLUCOSE 51 (L) 06/08/2022 1129   BUN 78 (H) 06/08/2022 1129   CREATININE 12.98 (H) 06/08/2022 1129   CALCIUM 8.5 (L) 06/08/2022 1129   PROT 6.9 06/08/2022 1129   ALBUMIN 3.7 06/08/2022 1129   AST 9 (L) 06/08/2022 1129   ALT 12 06/08/2022 1129   ALKPHOS 82 06/08/2022 1129   BILITOT 0.9 06/08/2022 1129   GFRNONAA 4 (L) 06/08/2022 1129   GFRAA 5 (L) 08/27/2019 1540     No results found.     Assessment & Plan:   1. ESRD (end stage renal disease) (HCC) ***  2. Essential hypertension, benign ***  3. Uncontrolled type 2 diabetes mellitus with hyperglycemia, with long-term current use of insulin (HCC) ***   Current Outpatient Medications on File Prior to Visit  Medication Sig Dispense Refill  . acetaminophen (TYLENOL) 325 MG tablet Take 1-2 tablets (325-650 mg total) by mouth every 4 (four) hours as needed for mild pain.    Marland Kitchen amiodarone (PACERONE) 200 MG tablet Take 1 tablet (200 mg total) by mouth every morning. 90 tablet 3  . amLODipine (NORVASC) 10 MG tablet Take 1 tablet (10 mg total) by mouth daily. (Patient taking differently: Take 5 mg by mouth daily.) 30 tablet 2  . ammonium lactate (AMLACTIN) 12 % cream Apply 1 Application topically as needed for dry skin. 385 g 0  . atorvastatin  (LIPITOR) 80 MG tablet TAKE ONE TABLET BY MOUTH AT BEDTIME. 90 tablet 3  . calcium acetate (PHOSLO) 667 MG capsule Take 2 capsules (1,334 mg total) by mouth 3 (three) times daily with meals. 180 capsule 0  . carvedilol (COREG) 25 MG tablet Take 25 mg by mouth 2 (two) times daily.    . cloNIDine (CATAPRES) 0.3 MG tablet Take 1 tablet (0.3 mg total) by mouth every 8 (eight) hours. 90 tablet 2  . diphenoxylate-atropine (LOMOTIL) 2.5-0.025 MG tablet Take as needed for diarrhea. Can take up to 4 times daily.    . dorzolamide-timolol (COSOPT) 22.3-6.8 MG/ML ophthalmic solution Place 1 drop into the right eye 2 (two) times daily.    . hydrALAZINE (APRESOLINE) 25 MG tablet Take 25 mg by mouth 3 (three) times daily.    Marland Kitchen levETIRAcetam (KEPPRA) 250 MG tablet Take 1 tablet (250 mg total) by mouth Every Tuesday,Thursday,and Saturday with dialysis. 15 tablet 1  . levETIRAcetam (KEPPRA) 500 MG tablet Take 1 tablet (500 mg total) by mouth daily after breakfast. 30 tablet 1  . levETIRAcetam (KEPPRA) 750 MG tablet Take 1 tablet (750 mg total) by mouth at bedtime. 60 tablet 2  . lidocaine-prilocaine (EMLA) cream Apply topically.    . nitroGLYCERIN (NITROSTAT) 0.4 MG SL tablet Place 1 tablet (0.4 mg total) under the tongue every 5 (five) minutes as needed for chest pain. 25 tablet 6  . silver sulfADIAZINE (SILVADENE) 1 % cream Apply 1 Application topically daily. 50 g 0   No current facility-administered medications on file prior to visit.    There are no Patient Instructions on file for this visit. No follow-ups on file.   Vivia Birmingham  Earley Favor, NP

## 2022-07-07 ENCOUNTER — Telehealth (INDEPENDENT_AMBULATORY_CARE_PROVIDER_SITE_OTHER): Payer: Self-pay

## 2022-07-07 NOTE — Telephone Encounter (Signed)
Spoke with the patient and he is scheduled with Dr. Wyn Quaker on 07/22/22 for a right arm AV fistula resection at [facility]. Pre-op phone call is on 07/15/22 between 8-1 pm. Pre- surgical instructions were discussed and will be sent to Mychart and mailed.

## 2022-07-09 ENCOUNTER — Encounter (INDEPENDENT_AMBULATORY_CARE_PROVIDER_SITE_OTHER): Payer: Self-pay | Admitting: Nurse Practitioner

## 2022-07-11 NOTE — Progress Notes (Incomplete)
Subjective:    Patient ID: [name]., male    DOB: August 21, 1970, 52 y.o.   MRN: [id number] No chief complaint on file.   [name] is a 52 year old male who presents today due to pain in his right fistula area.  The patient previously had a fistula in the upper extremity that was   Right arm venously had coils placed and today and notes that the close remain in place with some clotting around the area.  There still remains significant residual flow.  The patient notes significant pain in this area.    Review of Systems  Cardiovascular:        Arm pain and discomfort  All other systems reviewed and are negative.      Objective:   Physical Exam Vitals reviewed.  HENT:     Head: Normocephalic.  Pulmonary:     Effort: Pulmonary effort is normal.  Skin:    General: Skin is warm and dry.  Neurological:     Mental Status: He is alert and oriented to person, place, and time.  Psychiatric:        Mood and Affect: Mood normal.        Behavior: Behavior normal.        Thought Content: Thought content normal.        Judgment: Judgment normal.     BP (!) 156/78 (BP Location: Right Arm)   Pulse 69   Resp 16   Past Medical History:  Diagnosis Date  . Anxiety    a.) on BZO (clonazepam) PRN  . Aortic dilatation (HCC) 11/25/2020   a.) TTE 11/25/2020: Ao root measured 38 mm. b.) TTE 04/22/2021: Ao root measured 44 mm; c. 11/2021 Echo: Ao root 40mm.  . Atrial fibrillation (HCC)    a.) CHA2DS2-VASc = 6 (CHF, HTN, CVA x2, prior MI, T2DM). b.) rate/rhythm maintained on oral amiodarone + carvedilol; chronically anticoagulated using dose reduced apixaban + clopidogrel  . BPH (benign prostatic hyperplasia)   . Cardiac arrest (HCC) 07/26/2021   a.) in setting of hyperkalemia, NSTEMI, and seizure following missed HD; pulseless and apneic --> required 1 round of CPR prior to ROSC  . Cellulitis   . Cerebral hemorrhage (HCC)    a. 10/2021 interventricular hemorrhage in setting  of HTN emergency and DOAC/APT-->eliquis/plavix d/c'd.  . Chronic diarrhea   . Chronic heart failure with preserved ejection fraction (HFpEF) (HCC) 07/19/2017   a. 06/2017 Echo: EF 75%; b. 11/2020 Echo: EF 55-60%; c. 04/2021 Echo: EF 55-60%; d. 11/2021 Echo: EF 65-70%, GrII DD, nl RV fxn, sev dil LA, large circumferential pericardial eff w/o tamponade, triv MR, AoV sclerosis. Ao root 40mm.  . Chronic pain of both ankles   . Coronary artery disease 02/26/2020   a. 02/2020 Ant STEMI/PCI: LM nl, LAD 52m (3.5x30 Resolute Onyx), RI nl, LCX min irregs, RCA min irregs. EF 65%.  . Diabetes mellitus, type II (HCC)   . Diabetic neuropathy (HCC)   . Esophagitis   . ESRD (end stage renal disease) on dialysis [facility])    a.) [facility]; T-Th-Sat  . Frequent falls   . Gait instability   . Gastritis   . Gastroparesis   . GERD (gastroesophageal reflux disease)   . H/O enucleation of left eyeball   . Heart palpitations   . History of kidney stones   . HLD (hyperlipidemia)   . Hypertension   . Insomnia    a.) uses melatonin PRN  . Nausea and vomiting in adult  recurrent  . NSTEMI (non-ST elevated myocardial infarction) (HCC) 07/26/2021   a.) hyperkalemic from missed HD; seizures; decompensated to cardiac arrest and required CPR x 1 round prior to ROSC.  . Osteomyelitis (HCC)   . Pericardial effusion    a. 11/2021 Echo: EF 65-70%, GrII DD, nl RV fxn, sev dil LA, large circumferential pericardial eff w/o tamponade, triv MR, AoV sclerosis. Ao root 40mm.  . Seizure (HCC)    a.) last 07/26/2021 in setting of missed HD --> hyperkalemic at 6.5 --> pulseless/apneic and required CPR; discharged home on levetiracetam.  . ST elevation myocardial infarction (STEMI) of anterior wall (HCC) 02/26/2020   a.) LHC/PCI 02/26/2020 --> EF 65%; LVEDP 11 mmHg; 90% mLAD (3.5 x 20 mm Resolute Onyx DES x 1)  . Stroke [facility]) 04/2021   a.) documented as x 3 between 04/2021 and 06/2021    Social History   Socioeconomic History  .  Marital status: Married    Spouse name: [name]   . Number of children: 2  . Years of education: Not on file  . Highest education level: High school graduate  Occupational History  . Occupation: Disability    Comment: not employed  Tobacco Use  . Smoking status: Never  . Smokeless tobacco: Never  Vaping Use  . Vaping Use: Never used  Substance and Sexual Activity  . Alcohol use: No  . Drug use: No  . Sexual activity: Yes  Other Topics Concern  . Not on file  Social History Narrative   Oldest son killed in car crash June 2020.  Lives in [place] with his wife.   Social Determinants of Health   Financial Resource Strain: High Risk (07/05/2017)   Overall Financial Resource Strain (CARDIA)   . Difficulty of Paying Living Expenses: Hard  Food Insecurity: No Food Insecurity (02/28/2022)   Hunger Vital Sign   . Worried About Programme researcher, broadcasting/film/video in the Last Year: Never true   . Ran Out of Food in the Last Year: Never true  Transportation Needs: No Transportation Needs (02/28/2022)   PRAPARE - Transportation   . Lack of Transportation (Medical): No   . Lack of Transportation (Non-Medical): No  Physical Activity: Inactive (07/05/2017)   Exercise Vital Sign   . Days of Exercise per Week: 0 days   . Minutes of Exercise per Session: 0 min  Stress: Stress Concern Present (07/05/2017)   [employer] of Occupational Health - Occupational Stress Questionnaire   . Feeling of Stress : Rather much  Social Connections: Moderately Integrated (07/05/2017)   Social Connection and Isolation Panel [NHANES]   . Frequency of Communication with Friends and Family: Three times a week   . Frequency of Social Gatherings with Friends and Family: Once a week   . Attends Religious Services: More than 4 times per year   . Active Member of Clubs or Organizations: No   . Attends Banker Meetings: Never   . Marital Status: Married  Catering manager Violence: Not At Risk (02/28/2022)    Humiliation, Afraid, Rape, and Kick questionnaire   . Fear of Current or Ex-Partner: No   . Emotionally Abused: No   . Physically Abused: No   . Sexually Abused: No    Past Surgical History:  Procedure Laterality Date  . A/V FISTULAGRAM Left 08/31/2021   Procedure: A/V Fistulagram;  Surgeon: Annice Needy, MD;  Location: [name] INVASIVE CV LAB;  Service: Cardiovascular;  Laterality: Left;  . A/V FISTULAGRAM Left 01/25/2022  Procedure: A/V Fistulagram;  Surgeon: Annice Needy, MD;  Location: [name] INVASIVE CV LAB;  Service: Cardiovascular;  Laterality: Left;  . AMPUTATION Left 03/16/2016   Procedure: AMPUTATION DIGIT LEFT HALLUX;  Surgeon: Vivi Barrack, DPM;  Location: [facility] OR;  Service: Podiatry;  Laterality: Left;  can start around 5   . AMPUTATION Right 02/09/2021   Procedure: AMPUTATION DIGIT;  Surgeon: Marlyne Beards, MD;  Location: [facility] OR;  Service: Orthopedics;  Laterality: Right;  . AMPUTATION TOE Right 02/28/2022   Procedure: AMPUTATION TOE;  Surgeon: Louann Sjogren, DPM;  Location: [name] ORS;  Service: Podiatry;  Laterality: Right;  right fifth toe  . ARTHROSCOPIC REPAIR ACL Left   . AV FISTULA PLACEMENT Right 05/31/2019   Procedure: Brachiocephalic AV fistula creation;  Surgeon: Annice Needy, MD;  Location: [name] ORS;  Service: Vascular;  Laterality: Right;  . AV FISTULA PLACEMENT Left 08/13/2021   Procedure: ARTERIOVENOUS (AV) FISTULA CREATION (BRACHIALCEPHALIC);  Surgeon: Annice Needy, MD;  Location: [name] ORS;  Service: Vascular;  Laterality: Left;  . COLONOSCOPY WITH PROPOFOL N/A 10/28/2015   Procedure: COLONOSCOPY WITH PROPOFOL;  Surgeon: Christena Deem, MD;  Location: [facility] ENDOSCOPY;  Service: Endoscopy;  Laterality: N/A;  . COLONOSCOPY WITH PROPOFOL N/A 10/29/2015   Procedure: COLONOSCOPY WITH PROPOFOL;  Surgeon: Christena Deem, MD;  Location[facility] ENDOSCOPY;  Service: Endoscopy;  Laterality: N/A;  . CORONARY ULTRASOUND/IVUS N/A 02/26/2020   Procedure: Intravascular  Ultrasound/IVUS;  Surgeon: Corky Crafts, MD;  Location: [facility] INVASIVE CV LAB;  Service: Cardiovascular;  Laterality: N/A;  . CORONARY/GRAFT ACUTE MI REVASCULARIZATION N/A 02/26/2020   Procedure: Coronary/Graft Acute MI Revascularization;  Surgeon: Corky Crafts, MD;  Location: [facility] INVASIVE CV LAB;  Service: Cardiovascular;  Laterality: N/A;  . DIALYSIS/PERMA CATHETER INSERTION Right 04/26/2019   Perm Cath   . DIALYSIS/PERMA CATHETER INSERTION N/A 04/26/2019   Procedure: DIALYSIS/PERMA CATHETER INSERTION;  Surgeon: Annice Needy, MD;  Location: [name] INVASIVE CV LAB;  Service: Cardiovascular;  Laterality: N/A;  . DIALYSIS/PERMA CATHETER INSERTION N/A 05/25/2021   Procedure: DIALYSIS/PERMA CATHETER INSERTION;  Surgeon: Annice Needy, MD;  Location: [name] INVASIVE CV LAB;  Service: Cardiovascular;  Laterality: N/A;  . DIALYSIS/PERMA CATHETER INSERTION N/A 08/31/2021   Procedure: DIALYSIS/PERMA CATHETER INSERTION;  Surgeon: Annice Needy, MD;  Location: [name] INVASIVE CV LAB;  Service: Cardiovascular;  Laterality: N/A;  . DIALYSIS/PERMA CATHETER REMOVAL N/A 08/15/2019   Procedure: DIALYSIS/PERMA CATHETER REMOVAL;  Surgeon: Renford Dills, MD;  Location: [name] INVASIVE CV LAB;  Service: Cardiovascular;  Laterality: N/A;  . DIALYSIS/PERMA CATHETER REMOVAL N/A 03/15/2022   Procedure: DIALYSIS/PERMA CATHETER REMOVAL;  Surgeon: Annice Needy, MD;  Location: [name] INVASIVE CV LAB;  Service: Cardiovascular;  Laterality: N/A;  . EMBOLIZATION Right 02/06/2021   Procedure: EMBOLIZATION;  Surgeon: Annice Needy, MD;  Location: [name] INVASIVE CV LAB;  Service: Cardiovascular;  Laterality: Right;  Right Upper Extremity Dialysis Access, Permcath Placement  . ESOPHAGOGASTRODUODENOSCOPY (EGD) WITH PROPOFOL N/A 12/27/2017   Procedure: ESOPHAGOGASTRODUODENOSCOPY (EGD) WITH PROPOFOL;  Surgeon: Toledo, Boykin Nearing, MD;  Location: [name] ENDOSCOPY;  Service: Gastroenterology;  Laterality: N/A;  . EYE SURGERY    . LEFT  HEART CATH AND CORONARY ANGIOGRAPHY N/A 02/26/2020   Procedure: LEFT HEART CATH AND CORONARY ANGIOGRAPHY;  Surgeon: Corky Crafts, MD;  Location: [facility] INVASIVE CV LAB;  Service: Cardiovascular;  Laterality: N/A;  . PROSTATE SURGERY  2016  . TONSILECTOMY/ADENOIDECTOMY WITH MYRINGOTOMY    . TONSILLECTOMY    . UPPER EXTREMITY ANGIOGRAPHY Right  11/26/2020   Procedure: Upper Extremity Angiography;  Surgeon: Annice Needy, MD;  Location[facility] INVASIVE CV LAB;  Service: Cardiovascular;  Laterality: Right;  . UPPER EXTREMITY ANGIOGRAPHY Right 02/05/2021   Procedure: UPPER EXTREMITY ANGIOGRAPHY;  Surgeon: Annice Needy, MD;  Location: [name] INVASIVE CV LAB;  Service: Cardiovascular;  Laterality: Right;    Family History  Problem Relation Age of Onset  . CAD Father   . Stroke Father   . Diabetes Mellitus II Mother   . Kidney failure Mother   . Schizophrenia Mother     Allergies  Allergen Reactions  . Promethazine Diarrhea and Other (See Comments)    Muscle cramps  . Promethazine Hcl Other (See Comments)    Cramping all over  Other reaction(s): cramp all over  . Other     Other reaction(s): Unknown Other reaction(s): Unknown       Latest Ref Rng & Units 06/08/2022   11:29 AM 04/07/2022    4:49 AM 04/06/2022    7:51 AM  CBC  WBC 4.0 - 10.5 K/uL 6.4  5.5  6.4   Hemoglobin 13.0 - 17.0 g/dL 40.9  81.1  91.4   Hematocrit 39.0 - 52.0 % 37.4  34.4  33.3   Platelets 150 - 400 K/uL 105  151  161       CMP     Component Value Date/Time   NA 139 06/08/2022 1129   K 3.8 06/08/2022 1129   CL 111 06/08/2022 1129   CO2 14 (L) 06/08/2022 1129   GLUCOSE 51 (L) 06/08/2022 1129   BUN 78 (H) 06/08/2022 1129   CREATININE 12.98 (H) 06/08/2022 1129   CALCIUM 8.5 (L) 06/08/2022 1129   PROT 6.9 06/08/2022 1129   ALBUMIN 3.7 06/08/2022 1129   AST 9 (L) 06/08/2022 1129   ALT 12 06/08/2022 1129   ALKPHOS 82 06/08/2022 1129   BILITOT 0.9 06/08/2022 1129   GFRNONAA 4 (L) 06/08/2022 1129    GFRAA 5 (L) 08/27/2019 1540     No results found.     Assessment & Plan:   1. ESRD (end stage renal disease) (HCC) ***  2. Essential hypertension, benign Continue antihypertensive medications as already ordered, these medications have been reviewed and there are no changes at this time.  3. Uncontrolled type 2 diabetes mellitus with hyperglycemia, with long-term current use of insulin (HCC) Continue hypoglycemic medications as already ordered, these medications have been reviewed and there are no changes at this time.  Hgb A1C to be monitored as already arranged by primary service   Current Outpatient Medications on File Prior to Visit  Medication Sig Dispense Refill  . acetaminophen (TYLENOL) 325 MG tablet Take 1-2 tablets (325-650 mg total) by mouth every 4 (four) hours as needed for mild pain.    Marland Kitchen amiodarone (PACERONE) 200 MG tablet Take 1 tablet (200 mg total) by mouth every morning. 90 tablet 3  . amLODipine (NORVASC) 10 MG tablet Take 1 tablet (10 mg total) by mouth daily. (Patient taking differently: Take 5 mg by mouth daily.) 30 tablet 2  . ammonium lactate (AMLACTIN) 12 % cream Apply 1 Application topically as needed for dry skin. 385 g 0  . atorvastatin (LIPITOR) 80 MG tablet TAKE ONE TABLET BY MOUTH AT BEDTIME. 90 tablet 3  . calcium acetate (PHOSLO) 667 MG capsule Take 2 capsules (1,334 mg total) by mouth 3 (three) times daily with meals. 180 capsule 0  . carvedilol (COREG) 25 MG tablet Take 25 mg by  mouth 2 (two) times daily.    . cloNIDine (CATAPRES) 0.3 MG tablet Take 1 tablet (0.3 mg total) by mouth every 8 (eight) hours. 90 tablet 2  . diphenoxylate-atropine (LOMOTIL) 2.5-0.025 MG tablet Take as needed for diarrhea. Can take up to 4 times daily.    . dorzolamide-timolol (COSOPT) 22.3-6.8 MG/ML ophthalmic solution Place 1 drop into the right eye 2 (two) times daily.    . hydrALAZINE (APRESOLINE) 25 MG tablet Take 25 mg by mouth 3 (three) times daily.    Marland Kitchen  levETIRAcetam (KEPPRA) 250 MG tablet Take 1 tablet (250 mg total) by mouth Every Tuesday,Thursday,and Saturday with dialysis. 15 tablet 1  . levETIRAcetam (KEPPRA) 500 MG tablet Take 1 tablet (500 mg total) by mouth daily after breakfast. 30 tablet 1  . levETIRAcetam (KEPPRA) 750 MG tablet Take 1 tablet (750 mg total) by mouth at bedtime. 60 tablet 2  . lidocaine-prilocaine (EMLA) cream Apply topically.    . nitroGLYCERIN (NITROSTAT) 0.4 MG SL tablet Place 1 tablet (0.4 mg total) under the tongue every 5 (five) minutes as needed for chest pain. 25 tablet 6  . silver sulfADIAZINE (SILVADENE) 1 % cream Apply 1 Application topically daily. 50 g 0   No current facility-administered medications on file prior to visit.    There are no Patient Instructions on file for this visit. No follow-ups on file.   Georgiana Spinner, NP

## 2022-07-14 ENCOUNTER — Other Ambulatory Visit (INDEPENDENT_AMBULATORY_CARE_PROVIDER_SITE_OTHER): Payer: Self-pay | Admitting: Nurse Practitioner

## 2022-07-14 DIAGNOSIS — N186 End stage renal disease: Secondary | ICD-10-CM

## 2022-07-15 ENCOUNTER — Other Ambulatory Visit: Payer: Self-pay

## 2022-07-15 ENCOUNTER — Encounter
Admission: RE | Admit: 2022-07-15 | Discharge: 2022-07-15 | Disposition: A | Discharge status: Home / Self Care | Payer: Medicare HMO | Source: Ambulatory Visit | Attending: Vascular Surgery | Admitting: Vascular Surgery

## 2022-07-15 ENCOUNTER — Telehealth: Payer: Self-pay | Admitting: *Deleted

## 2022-07-15 VITALS — Ht 75.0 in | Wt 180.0 lb

## 2022-07-15 DIAGNOSIS — E1121 Type 2 diabetes mellitus with diabetic nephropathy: Secondary | ICD-10-CM

## 2022-07-15 NOTE — Telephone Encounter (Signed)
-----   Message from Verlee Monte, NP sent at 07/15/2022 10:59 AM EDT ----- Regarding: Request for pre-operative cardiac clearance Request for pre-operative cardiac clearance:  1. What type of surgery is being performed?  REVISON OF ARTERIOVENOUS FISTULA (RESECTION)  2. When is this surgery scheduled?  07/22/2022  3. Type of clearance being requested (medical, pharmacy, both)? MEDICAL   4. Are there any medications that need to be held prior to surgery? NONE  5. Practice name and name of physician performing surgery?  Performing surgeon: Dr. Festus Barren, MD Requesting clearance: Quentin Mulling, FNP-C    6. Anesthesia type (none, local, MAC, general)? GENERAL  7. What is the office phone and fax number?   [phone]  ATTENTION: Unable to create telephone message as per your standard workflow. Directed by [facility] providers to send requests for cardiac clearance to this pool for appropriate distribution to provider covering pre-operative clearances.   Quentin Mulling, MSN, APRN, FNP-C, CEN [facility]  Peri-operative Services Nurse Practitioner Phone: [phone] 07/15/22 10:59 AM

## 2022-07-15 NOTE — Telephone Encounter (Signed)
   Name: [name].  DOB: 1970/05/18  MRN: [id number]  Primary Cardiologist: Lance Muss, MD   Preoperative team, please contact this patient and set up a phone call appointment for further preoperative risk assessment. Please obtain consent and complete medication review. Thank you for your help.  I confirm that guidance regarding antiplatelet and oral anticoagulation therapy has been completed and, if necessary, noted below.  No medications requested.   Napoleon Form, Leodis Rains, NP 07/15/2022, 11:23 AM [facility]

## 2022-07-15 NOTE — Telephone Encounter (Signed)
Pt has been added ok per pre op APP Robin Searing, NP today due to procedure date. We just received request today. Med rec and consent are done.

## 2022-07-15 NOTE — Patient Instructions (Addendum)
Your procedure is scheduled on: Thursday 07/22/22 To find out your arrival time, please call [phone] between 1PM - 3PM on:   Wednesday 07/21/22 Report to the Registration Desk on the 1st floor of the Medical Mall. Valet parking is available.  If your arrival time is 6:00 am, do not arrive before that time as the [facility] entrance doors do not open until 6:00 am.  REMEMBER: Instructions that are not followed completely may result in serious medical risk, up to and including death; or upon the discretion of your surgeon and anesthesiologist your surgery may need to be rescheduled.  Do not eat food or drink any liquids after midnight the night before surgery.  No gum chewing or hard candies.  One week prior to surgery: Stop Anti-inflammatories (NSAIDS) such as Advil, Aleve, Ibuprofen, Motrin, Naproxen, Naprosyn and Aspirin based products such as Excedrin, Goody's Powder, BC Powder. You may however, continue to take Tylenol if needed for pain up until the day of surgery.  Stop ANY OVER THE COUNTER supplements or vitamins until after surgery.  Continue taking all prescribed medications.   TAKE ONLY THESE MEDICATIONS THE MORNING OF SURGERY WITH A SIP OF WATER:  amiodarone (PACERONE)  amLODipine (NORVASC)  atorvastatin (LIPITOR)  carvedilol (COREG)  cloNIDine (CATAPRES)  hydrALAZINE (APRESOLINE)  levETIRAcetam (KEPPRA)  May use your eye drops  No Alcohol for 24 hours before or after surgery.  No Smoking including e-cigarettes for 24 hours before surgery.  No chewable tobacco products for at least 6 hours before surgery.  No nicotine patches on the day of surgery.  Do not use any "recreational" drugs for at least a week (preferably 2 weeks) before your surgery.  Please be advised that the combination of cocaine and anesthesia may have negative outcomes, up to and including death. If you test positive for cocaine, your surgery will be cancelled.  On the morning of surgery  brush your teeth with toothpaste and water, you may rinse your mouth with mouthwash if you wish. Do not swallow any toothpaste or mouthwash.  Use CHG Soap or wipes as directed on instruction sheet. You may shower with an antibacterial soap like Dial  Do not wear lotions, powders, or perfumes. No Deodorant the morning of your surgery.  Do not shave body hair from the neck down 48 hours before surgery.  Wear comfortable clothing (specific to your surgery type) to the hospital.  Do not wear jewelry, make-up, hairpins, clips or nail polish.  Contact lenses, hearing aids and dentures may not be worn into surgery.  Do not bring valuables to the hospital. [facility] is not responsible for any missing/lost belongings or valuables.   Notify your doctor if there is any change in your medical condition (cold, fever, infection).  If you are being discharged the day of surgery, you will not be allowed to drive home. You will need a responsible individual to drive you home and stay with you for 24 hours after surgery.   If you are taking public transportation, you will need to have a responsible individual with you.  If you are being admitted to the hospital overnight, leave your suitcase in the car. After surgery it may be brought to your room.  In case of increased patient census, it may be necessary for you, the patient, to continue your postoperative care in the Same Day Surgery department.  After surgery, you can help prevent lung complications by doing breathing exercises.  Take deep breaths and cough every 1-2  hours. Your doctor may order a device called an Incentive Spirometer to help you take deep breaths. When coughing or sneezing, hold a pillow firmly against your incision with both hands. This is called "splinting." Doing this helps protect your incision. It also decreases belly discomfort.  Surgery Visitation Policy:  Patients undergoing a surgery or procedure may have two family  members or support persons with them as long as the person is not COVID-19 positive or experiencing its symptoms.   Inpatient Visitation:    Visiting hours are 7 a.m. to 8 p.m. Up to four visitors are allowed at one time in a patient room. The visitors may rotate out with other people during the day. One designated support person (adult) may remain overnight.  Please call the Pre-admissions Testing Dept. at [phone] if you have any questions about these instructions.

## 2022-07-15 NOTE — Telephone Encounter (Signed)
Pt has been added ok per pre op APP Robin Searing, NP today due to procedure date. We just received request today. Med rec and consent are done.      Patient Consent for Virtual Visit        [name]. has provided verbal consent on 07/15/2022 for a virtual visit (video or telephone).   CONSENT FOR VIRTUAL VISIT FOR:  [name].  By participating in this virtual visit I agree to the following:  I hereby voluntarily request, consent and authorize [facility] and its employed or contracted physicians, physician assistants, nurse practitioners or other licensed health care professionals (the Practitioner), to provide me with telemedicine health care services (the "Services") as deemed necessary by the treating Practitioner. I acknowledge and consent to receive the Services by the Practitioner via telemedicine. I understand that the telemedicine visit will involve communicating with the Practitioner through live audiovisual communication technology and the disclosure of certain medical information by electronic transmission. I acknowledge that I have been given the opportunity to request an in-person assessment or other available alternative prior to the telemedicine visit and am voluntarily participating in the telemedicine visit.  I understand that I have the right to withhold or withdraw my consent to the use of telemedicine in the course of my care at any time, without affecting my right to future care or treatment, and that the Practitioner or I may terminate the telemedicine visit at any time. I understand that I have the right to inspect all information obtained and/or recorded in the course of the telemedicine visit and may receive copies of available information for a reasonable fee.  I understand that some of the potential risks of receiving the Services via telemedicine include:  Delay or interruption in medical evaluation due to technological equipment failure or  disruption; Information transmitted may not be sufficient (e.g. poor resolution of images) to allow for appropriate medical decision making by the Practitioner; and/or  In rare instances, security protocols could fail, causing a breach of personal health information.  Furthermore, I acknowledge that it is my responsibility to provide information about my medical history, conditions and care that is complete and accurate to the best of my ability. I acknowledge that Practitioner's advice, recommendations, and/or decision may be based on factors not within their control, such as incomplete or inaccurate data provided by me or distortions of diagnostic images or specimens that may result from electronic transmissions. I understand that the practice of medicine is not an exact science and that Practitioner makes no warranties or guarantees regarding treatment outcomes. I acknowledge that a copy of this consent can be made available to me via my patient portal [facility] MyChart), or I can request a printed copy by calling the office of [facility].    I understand that my insurance will be billed for this visit.   I have read or had this consent read to me. I understand the contents of this consent, which adequately explains the benefits and risks of the Services being provided via telemedicine.  I have been provided ample opportunity to ask questions regarding this consent and the Services and have had my questions answered to my satisfaction. I give my informed consent for the services to be provided through the use of telemedicine in my medical care

## 2022-07-20 ENCOUNTER — Encounter: Payer: Self-pay | Admitting: Vascular Surgery

## 2022-07-20 ENCOUNTER — Ambulatory Visit: Payer: Medicare HMO | Attending: Cardiology

## 2022-07-20 DIAGNOSIS — Z01810 Encounter for preprocedural cardiovascular examination: Secondary | ICD-10-CM | POA: Diagnosis not present

## 2022-07-20 NOTE — Progress Notes (Signed)
Virtual Visit via Telephone Note   Because of [name] Herrera Jr.'s co-morbid illnesses, he is at least at moderate risk for complications without adequate follow up.  This format is felt to be most appropriate for this patient at this time.  The patient did not have access to video technology/had technical difficulties with video requiring transitioning to audio format only (telephone).  All issues noted in this document were discussed and addressed.  No physical exam could be performed with this format.  Please refer to the patient's chart for his consent to telehealth for [facility].  Evaluation Performed:  Preoperative cardiovascular risk assessment _____________   Date:  07/20/2022   Patient ID:  [name] Barns., DOB 1971-01-01, MRN [id number] Patient Location:  Home Provider location:   Office  Primary Care Provider:  [facility] Primary Cardiologist:  Lance Muss, MD  Chief Complaint / Patient Profile   52 y.o. y/o male with a h/o CAD S/P Anterior  STEMI  with DES toLM, uncontrolled DM, ESRD on HD (waiting organ transplant), HTN, HLD, gastroparesis, and diastolic dysfunction, ICH who is pending AV fistula revision and presents today for telephonic preoperative cardiovascular risk assessment.  History of Present Illness    [name] Jua Kiker. is a 52 y.o. male who presents via audio/video conferencing for a telehealth visit today.  Pt was last seen in cardiology clinic on 03/24/2022 by Dr. Eldridge Dace.  At that time Laterrance Finlee Stoetzel. was doing well with no new cardiac complaints and was walking a lot.  The patient is now pending procedure as outlined above. Since his last visit, he reports doing well with no cardiac changes.  He denies chest pain, shortness of breath, lower extremity edema, fatigue, palpitations, melena, hematuria, hemoptysis, diaphoresis, weakness, presyncope, syncope, orthopnea, and PND.   Past Medical History     Past Medical History:  Diagnosis Date   Anxiety    a.) on BZO (clonazepam) PRN   Aortic dilatation (HCC) 11/25/2020   a.) TTE 11/25/2020: Ao root measured 38 mm. b.) TTE 04/22/2021: Ao root measured 44 mm; c. 11/2021 Echo: Ao root 40mm.   Atrial fibrillation (HCC)    a.) CHA2DS2-VASc = 6 (CHF, HTN, CVA x2, prior MI, T2DM). b.) rate/rhythm maintained on oral amiodarone + carvedilol; chronically anticoagulated using dose reduced apixaban + clopidogrel   BPH (benign prostatic hyperplasia)    Cardiac arrest (HCC) 07/26/2021   a.) in setting of hyperkalemia, NSTEMI, and seizure following missed HD; pulseless and apneic --> required 1 round of CPR prior to ROSC   Cellulitis    Cerebral hemorrhage (HCC)    a. 10/2021 interventricular hemorrhage in setting of HTN emergency and DOAC/APT-->eliquis/plavix d/c'd.   Chronic diarrhea    Chronic heart failure with preserved ejection fraction (HFpEF) (HCC) 07/19/2017   a. 06/2017 Echo: EF 75%; b. 11/2020 Echo: EF 55-60%; c. 04/2021 Echo: EF 55-60%; d. 11/2021 Echo: EF 65-70%, GrII DD, nl RV fxn, sev dil LA, large circumferential pericardial eff w/o tamponade, triv MR, AoV sclerosis. Ao root 40mm.   Chronic pain of both ankles    Coronary artery disease 02/26/2020   a. 02/2020 Ant STEMI/PCI: LM nl, LAD 4m (3.5x30 Resolute Onyx), RI nl, LCX min irregs, RCA min irregs. EF 65%.   Diabetes mellitus, type II (HCC)    Diabetic neuropathy (HCC)    Esophagitis    ESRD (end stage renal disease) on dialysis (HCC)    a.) [facility]; T-Th-Sat   Frequent falls  Gait instability    Gastritis    Gastroparesis    GERD (gastroesophageal reflux disease)    H/O enucleation of left eyeball    Heart palpitations    History of kidney stones    HLD (hyperlipidemia)    Hypertension    Insomnia    a.) uses melatonin PRN   Nausea and vomiting in adult    recurrent   NSTEMI (non-ST elevated myocardial infarction) (HCC) 07/26/2021   a.) hyperkalemic from missed HD;  seizures; decompensated to cardiac arrest and required CPR x 1 round prior to ROSC.   Osteomyelitis (HCC)    Pericardial effusion    a. 11/2021 Echo: EF 65-70%, GrII DD, nl RV fxn, sev dil LA, large circumferential pericardial eff w/o tamponade, triv MR, AoV sclerosis. Ao root 40mm.   Seizure (HCC)    a.) last 07/26/2021 in setting of missed HD --> hyperkalemic at 6.5 --> pulseless/apneic and required CPR; discharged home on levetiracetam.   ST elevation myocardial infarction (STEMI) of anterior wall (HCC) 02/26/2020   a.) LHC/PCI 02/26/2020 --> EF 65%; LVEDP 11 mmHg; 90% mLAD (3.5 x 20 mm Resolute Onyx DES x 1)   Stroke (HCC) 04/2021   a.) documented as x 3 between 04/2021 and 06/2021   Past Surgical History:  Procedure Laterality Date   A/V FISTULAGRAM Left 08/31/2021   Procedure: A/V Fistulagram;  Surgeon: Annice Needy, MD;  Location: [name] INVASIVE CV LAB;  Service: Cardiovascular;  Laterality: Left;   A/V FISTULAGRAM Left 01/25/2022   Procedure: A/V Fistulagram;  Surgeon: Annice Needy, MD;  Location: [name] INVASIVE CV LAB;  Service: Cardiovascular;  Laterality: Left;   AMPUTATION Left 03/16/2016   Procedure: AMPUTATION DIGIT LEFT HALLUX;  Surgeon: Vivi Barrack, DPM;  Location: [facility] OR;  Service: Podiatry;  Laterality: Left;  can start around 5    AMPUTATION Right 02/09/2021   Procedure: AMPUTATION DIGIT;  Surgeon: Marlyne Beards, MD;  Location: [facility] OR;  Service: Orthopedics;  Laterality: Right;   AMPUTATION TOE Right 02/28/2022   Procedure: AMPUTATION TOE;  Surgeon: Louann Sjogren, DPM;  Location: [name] ORS;  Service: Podiatry;  Laterality: Right;  right fifth toe   ARTHROSCOPIC REPAIR ACL Left    AV FISTULA PLACEMENT Right 05/31/2019   Procedure: Brachiocephalic AV fistula creation;  Surgeon: Annice Needy, MD;  Location: [name] ORS;  Service: Vascular;  Laterality: Right;   AV FISTULA PLACEMENT Left 08/13/2021   Procedure: ARTERIOVENOUS (AV) FISTULA CREATION (BRACHIALCEPHALIC);  Surgeon:  Annice Needy, MD;  Location: [name] ORS;  Service: Vascular;  Laterality: Left;   COLONOSCOPY WITH PROPOFOL N/A 10/28/2015   Procedure: COLONOSCOPY WITH PROPOFOL;  Surgeon: Christena Deem, MD;  Location: [facility] ENDOSCOPY;  Service: Endoscopy;  Laterality: N/A;   COLONOSCOPY WITH PROPOFOL N/A 10/29/2015   Procedure: COLONOSCOPY WITH PROPOFOL;  Surgeon: Christena Deem, MD;  [facility] ENDOSCOPY;  Service: Endoscopy;  Laterality: N/A;   CORONARY ULTRASOUND/IVUS N/A 02/26/2020   Procedure: Intravascular Ultrasound/IVUS;  Surgeon: Corky Crafts, MD;  Location: [facility] INVASIVE CV LAB;  Service: Cardiovascular;  Laterality: N/A;   CORONARY/GRAFT ACUTE MI REVASCULARIZATION N/A 02/26/2020   Procedure: Coronary/Graft Acute MI Revascularization;  Surgeon: Corky Crafts, MD;  Location: [facility] INVASIVE CV LAB;  Service: Cardiovascular;  Laterality: N/A;   DIALYSIS/PERMA CATHETER INSERTION Right 04/26/2019   Perm Cath    DIALYSIS/PERMA CATHETER INSERTION N/A 04/26/2019   Procedure: DIALYSIS/PERMA CATHETER INSERTION;  Surgeon: Annice Needy, MD;  Location: [name] INVASIVE CV LAB;  Service: Cardiovascular;  Laterality: N/A;   DIALYSIS/PERMA CATHETER INSERTION N/A 05/25/2021   Procedure: DIALYSIS/PERMA CATHETER INSERTION;  Surgeon: Annice Needy, MD;  Location: [name] INVASIVE CV LAB;  Service: Cardiovascular;  Laterality: N/A;   DIALYSIS/PERMA CATHETER INSERTION N/A 08/31/2021   Procedure: DIALYSIS/PERMA CATHETER INSERTION;  Surgeon: Annice Needy, MD;  Location: [name] INVASIVE CV LAB;  Service: Cardiovascular;  Laterality: N/A;   DIALYSIS/PERMA CATHETER REMOVAL N/A 08/15/2019   Procedure: DIALYSIS/PERMA CATHETER REMOVAL;  Surgeon: Renford Dills, MD;  Location: [name] INVASIVE CV LAB;  Service: Cardiovascular;  Laterality: N/A;   DIALYSIS/PERMA CATHETER REMOVAL N/A 03/15/2022   Procedure: DIALYSIS/PERMA CATHETER REMOVAL;  Surgeon: Annice Needy, MD;  Location: [name] INVASIVE CV LAB;  Service: Cardiovascular;   Laterality: N/A;   EMBOLIZATION Right 02/06/2021   Procedure: EMBOLIZATION;  Surgeon: Annice Needy, MD;  Location: [name] INVASIVE CV LAB;  Service: Cardiovascular;  Laterality: Right;  Right Upper Extremity Dialysis Access, Permcath Placement   ESOPHAGOGASTRODUODENOSCOPY (EGD) WITH PROPOFOL N/A 12/27/2017   Procedure: ESOPHAGOGASTRODUODENOSCOPY (EGD) WITH PROPOFOL;  Surgeon: Toledo, Boykin Nearing, MD;  Location: [name] ENDOSCOPY;  Service: Gastroenterology;  Laterality: N/A;   EYE SURGERY     LEFT HEART CATH AND CORONARY ANGIOGRAPHY N/A 02/26/2020   Procedure: LEFT HEART CATH AND CORONARY ANGIOGRAPHY;  Surgeon: Corky Crafts, MD;  Location: [facility]VASIVE CV LAB;  Service: Cardiovascular;  Laterality: N/A;   PROSTATE SURGERY  2016   TONSILECTOMY/ADENOIDECTOMY WITH MYRINGOTOMY     TONSILLECTOMY     UPPER EXTREMITY ANGIOGRAPHY Right 11/26/2020   Procedure: Upper Extremity Angiography;  Surgeon: Annice Needy, MD;  Location: [name] INVASIVE CV LAB;  Service: Cardiovascular;  Laterality: Right;   UPPER EXTREMITY ANGIOGRAPHY Right 02/05/2021   Procedure: UPPER EXTREMITY ANGIOGRAPHY;  Surgeon: Annice Needy, MD;  Location: [name] INVASIVE CV LAB;  Service: Cardiovascular;  Laterality: Right;    Allergies  Allergies  Allergen Reactions   Promethazine Diarrhea and Other (See Comments)    Muscle cramps   Promethazine Hcl Other (See Comments)    Cramping all over  Other reaction(s): cramp all over   Other     Other reaction(s): Unknown Other reaction(s): Unknown    Home Medications    Prior to Admission medications   Medication Sig Start Date End Date Taking? Authorizing Provider  acetaminophen (TYLENOL) 325 MG tablet Take 1-2 tablets (325-650 mg total) by mouth every 4 (four) hours as needed for mild pain. 05/04/21   Love, Evlyn Kanner, PA-C  amiodarone (PACERONE) 200 MG tablet Take 1 tablet (200 mg total) by mouth every morning. 08/10/21 08/11/22  Swinyer, Zachary George, NP  amLODipine (NORVASC) 10 MG  tablet Take 1 tablet (10 mg total) by mouth daily. Patient taking differently: Take 5 mg by mouth daily. 02/28/22   Lurene Shadow, MD  ammonium lactate (AMLACTIN) 12 % cream Apply 1 Application topically as needed for dry skin. 05/25/22   Vivi Barrack, DPM  atorvastatin (LIPITOR) 80 MG tablet TAKE ONE TABLET BY MOUTH AT BEDTIME. 05/07/22   Corky Crafts, MD  calcium acetate (PHOSLO) 667 MG capsule Take 2 capsules (1,334 mg total) by mouth 3 (three) times daily with meals. 05/04/21   Love, Evlyn Kanner, PA-C  carvedilol (COREG) 25 MG tablet Take 25 mg by mouth 2 (two) times daily. 06/16/21   [provider]  cloNIDine (CATAPRES) 0.3 MG tablet Take 1 tablet (0.3 mg total) by mouth every 8 (eight) hours. 12/03/21   Osvaldo Shipper, MD  diphenoxylate-atropine (LOMOTIL) 2.5-0.025 MG tablet Take  as needed for diarrhea. Can take up to 4 times daily. 03/05/22   [provider]  dorzolamide-timolol (COSOPT) 22.3-6.8 MG/ML ophthalmic solution Place 1 drop into the right eye 2 (two) times daily.    [provider]  hydrALAZINE (APRESOLINE) 25 MG tablet Take 25 mg by mouth 3 (three) times daily. 03/30/22   [provider]  levETIRAcetam (KEPPRA) 250 MG tablet Take 1 tablet (250 mg total) by mouth Every Tuesday,Thursday,and Saturday with dialysis. 04/08/22   Cleora Fleet, MD  levETIRAcetam (KEPPRA) 500 MG tablet Take 1 tablet (500 mg total) by mouth daily after breakfast. 04/08/22   Laural Benes, Clanford L, MD  levETIRAcetam (KEPPRA) 750 MG tablet Take 1 tablet (750 mg total) by mouth at bedtime. Patient not taking: Reported on 07/15/2022 04/08/22   Cleora Fleet, MD  lidocaine-prilocaine (EMLA) cream Apply topically. 12/24/21   [provider]  nitroGLYCERIN (NITROSTAT) 0.4 MG SL tablet Place 1 tablet (0.4 mg total) under the tongue every 5 (five) minutes as needed for chest pain. 03/24/22 07/15/22  Corky Crafts, MD  silver sulfADIAZINE (SILVADENE) 1 % cream  Apply 1 Application topically daily. 05/25/22   Vivi Barrack, DPM    Physical Exam    Vital Signs:  [name] Barns. does not have vital signs available for review today.  Given telephonic nature of communication, physical exam is limited. AAOx3. NAD. Normal affect.  Speech and respirations are unlabored.  Accessory Clinical Findings    None  Assessment & Plan    1.  Preoperative Cardiovascular Risk Assessment:  Patient's RCRI is 11%  The patient affirms he has been doing well without any new cardiac symptoms. They are able to achieve 5 METS without cardiac limitations. Therefore, based on ACC/AHA guidelines, the patient would be at acceptable risk for the planned procedure without further cardiovascular testing. The patient was advised that if he develops new symptoms prior to surgery to contact our office to arrange for a follow-up visit, and he verbalized understanding.   The patient was advised that if he develops new symptoms prior to surgery to contact our office to arrange for a follow-up visit, and he verbalized understanding.  No medications requested to be held A copy of this note will be routed to requesting surgeon.  Time:   Today, I have spent 6 minutes with the patient with telehealth technology discussing medical history, symptoms, and management plan.     Napoleon Form, Leodis Rains, NP  07/20/2022, 7:57 AM

## 2022-07-20 NOTE — Progress Notes (Signed)
Perioperative / Anesthesia Services  Pre-Admission Testing Clinical Review / Preoperative Anesthesia Consult  Date: 07/20/22  Patient Demographics:  Name: [name]. DOB:   1970-06-13 MRN:   [id number]  Planned Surgical Procedure(s):    Case: [id number] Date/Time: 07/22/22 0919   Procedure: REVISON OF ARTERIOVENOUS FISTULA (RESECTION) (Right)   Anesthesia type: General   Pre-op diagnosis: ESRD   Location: [name] OR ROOM 08 / [name]S FOR [facility] GROUP   Surgeons: Annice Needy, MD     NOTE: Available PAT nursing documentation and vital signs have been reviewed. Clinical nursing staff has updated patient's PMH/PSHx, current medication list, and drug allergies/intolerances to ensure comprehensive history available to assist in medical decision making as it pertains to the aforementioned surgical procedure and anticipated anesthetic course. Extensive review of available clinical information personally performed. [facility] and PSHx updated with any diagnoses/procedures that  may have been inadvertently omitted during his intake with the pre-admission testing department's nursing staff.  Clinical Discussion:  [name] Zyree [place]. is a 52 y.o. male who is submitted for pre-surgical anesthesia review and clearance prior to him undergoing the above procedure. Patient has never been a smoker. Pertinent PMH includes: CAD, cardiac arrest, STEMI, NSTEMI, atrial fibrillation, HFpEF, aortic dilatation, palpitations, CVA x 3, HTN, HLD, T2DM, ESRD (on hemodialysis T-Th-Sat), GERD (no daily Tx), anemia of chronic renal disease, gastroparesis, gait instability/frequent falls, diabetic neuropathy, BPH, seizures, cervical DDD, anxiety, LEFT eye enucleation, insomnia.  Patient is followed by cardiology Eldridge Dace, MD). He was last seen in the cardiology clinic on 03/24/2022; notes reviewed. At the time of his clinic visit, patient doing well overall from a cardiovascular perspective. Patient denied  any chest pain, shortness of breath, PND, orthopnea, palpitations, significant peripheral edema, weakness, fatigue, vertiginous symptoms, or presyncope/syncope. Patient with a past medical history significant for cardiovascular diagnoses. Documented physical exam was grossly benign, providing no evidence of acute exacerbation and/or decompensation of the patient's known cardiovascular conditions.  Patient suffered an anterior wall STEMI in 02/26/2020.  Diagnostic left heart catheterization revealed normal left ventricular systolic function with an EF of 65%.  LVEDP 11 mmHg.  There was single-vessel CAD with a 90% stenosis noted to the mid LAD.  Subsequent PCI was performed placing a 3.5 x 20 mm Resolute Onyx DES (mLAD) yielding excellent angiographic result and TIMI-3 flow.   While being seen for procedures related to hyperkalemia (6.5 mmol/L) secondary to missed dialysis treatment, patient went into cardiac arrest while in the ED.  CPR was required.  Notes indicate that 1 round of CPR was provided prior to ROSC.  Patient presented for care at [facility], however was transferred to [facility] for neurology evaluation and testing.   Patient has suffered multiple strokes in the past.    MRI brain 02/13/2021 --> punctate acute infarct of the LEFT hippocampus.   MRI brain 04/21/2021 --> patchy acute cortically-based infarcts within the BILATERAL high frontal lobes and RIGHT parietal lobe (predominantly in a watershed distribution). Subcentimeter acute infarct within the LEFT callosal splenium.  CT head 11/21/2021 --> 1.4 x 1.5 x 3.1 cm focus of acute hemorrhage within the inferior aspect of the fourth ventricle, and extending inferiorly through the foramen of [place].  Most recent TTE was performed on 02/28/2022 revealing a normal left ventricular systolic function with an EF of 60 to 65%.  There was severe concentric LVH noted.  Right ventricular size and function normal.  Diastolic  Doppler parameters indeterminant.  Severe mitral annular calcification  was noted.  Aortic valve sclerosis/calcification present.  All transvalvular gradients were noted to be normal providing no evidence suggestive of valvular stenosis.  Moderate circumferential pericardial effusion with the greatest diameter of 2.84 cm noted.  No evidence of cardiac tamponade present.  With severe LVH, PYP scan to rule out amyloidosis was recommended.  Patient with an atrial fibrillation diagnosis; CHA2DS2-VASc Score = 6 (CHF, HTN, CVA x 2, prior MI, T2DM) . His rate and rhythm are currently being maintained on oral amiodarone + carvedilol.  Following his ICH, oral anticoagulation and antithrombotic therapies have been discontinued.  Recommended following up with neurology to discuss the feasibility of restarting either clopidogrel and/or daily low-dose ASA.  Blood pressure well-controlled at 118/72 mmHg on currently prescribed CCB (amlodipine), beta-blocker (carvedilol), and alpha-blocker (clonidine) therapies.  Patient is on atorvastatin for his HLD diagnosis.  Short acting nitrates (NTG) to use on a as needed basis patient has a supply for recurrent angina/anginal equivalent symptoms; denied recent use. T2DM well-controlled with diet lifestyle modification alone; last HgbA1c was 4.5% when checked on 11/21/2021.  Patient making efforts to maintain an active lifestyle. Functional capacity, as defined by DASI, is documented as being >/= 4 METS.  No changes were made to his medication regimen.  Patient follow-up with outpatient cardiology in 1 year or sooner if needed.  [name] Glori Luis. is scheduled for REVISON OF ARTERIOVENOUS FISTULA (RESECTION) (Right) on 07/22/2022 with Dr. Festus Barren, MD.  Given patient's past medical history significant for cardiovascular diagnoses, presurgical cardiac clearance was sought by the PAT team. Per cardiology, "patient's RCRI is 11%. The patient affirms he has been doing well without any new  cardiac symptoms. They are able to achieve 5 METS without cardiac limitations. Therefore, based on ACC/AHA guidelines, the patient would be at ACCEPTABLE risk for the planned procedure without further cardiovascular testing".   As previously mentioned, patient is off all of his oral anticoagulation and antithrombotic medications following previous ICH.  Patient denies previous perioperative complications with anesthesia in the past. In review of the available records, it is noted that patient underwent a MAC anesthetic course at [facility] ([facility]) in 06/2022 without documented complications.      07/15/2022   10:17 AM 06/18/2022    3:31 PM 06/08/2022   12:30 PM  Vitals with BMI  Height 6\' 3"     Weight 180 lbs    BMI 22.5    Systolic  156 195  Diastolic  78 88  Pulse  69 78    Providers/Specialists:   NOTE: Primary physician provider listed below. Patient may have been seen by APP or partner within same practice.   PROVIDER ROLE / SPECIALTY LAST Shanna Cisco, MD Vascular Surgery (Surgeon) 06/18/2022  [facility] Primary Care Provider 07/16/2022  Corky Crafts, MD   Cardiology 03/24/2022  Cristopher Peru, MD Neurology 05/03/2022   Allergies:  Promethazine, Promethazine hcl, and Other  Current Home Medications:   No current facility-administered medications for this encounter.    acetaminophen (TYLENOL) 325 MG tablet   amiodarone (PACERONE) 200 MG tablet   amLODipine (NORVASC) 10 MG tablet   ammonium lactate (AMLACTIN) 12 % cream   atorvastatin (LIPITOR) 80 MG tablet   calcium acetate (PHOSLO) 667 MG capsule   carvedilol (COREG) 25 MG tablet   cloNIDine (CATAPRES) 0.3 MG tablet   diphenoxylate-atropine (LOMOTIL) 2.5-0.025 MG tablet   dorzolamide-timolol (COSOPT) 22.3-6.8 MG/ML ophthalmic solution   hydrALAZINE (APRESOLINE) 25  MG tablet   levETIRAcetam (KEPPRA) 250 MG tablet   levETIRAcetam (KEPPRA) 500 MG tablet    levETIRAcetam (KEPPRA) 750 MG tablet   lidocaine-prilocaine (EMLA) cream   nitroGLYCERIN (NITROSTAT) 0.4 MG SL tablet   silver sulfADIAZINE (SILVADENE) 1 % cream   History:   Past Medical History:  Diagnosis Date   Acute bilateral cerebral infarction in a watershed distribution [facility]) 04/21/2021   a.) MRI brain 04/21/2021 --> patchy ACUTE cortically-based infarcts within the bilateral high frontal lobes and right parietal lobe   Acute cerebral infarction (HCC) 02/13/2021   a.) MRI brain 02/13/2021: acute LEFT hippocampal infarct   Acute cerebral infarction (HCC) 04/21/2021   a.) MRI brain 04/21/2021: subcentimeter ACUTE infarct within the callosal splenium on the LEFT   Anemia of chronic renal failure    Anxiety    Aortic dilatation (HCC) 11/25/2020   a.) TTE 11/25/2020: Ao root measured 38 mm. b.) TTE 04/22/2021: Ao root measured 44 mm; c. 11/2021 Echo: Ao root 40mm.   Atrial fibrillation (HCC)    a.) CHA2DS2-VASc = 6 (CHF, HTN, CVA x2, prior MI, T2DM). b.) rate/rhythm maintained on oral amiodarone + carvedilol; chronic OAC/AP therapy discontinued following ICH   BPH (benign prostatic hyperplasia)    Cardiac arrest (HCC) 07/26/2021   a.) in setting of hyperkalemia, NSTEMI, and seizure following missed HD; pulseless and apneic --> required 1 round of CPR prior to ROSC   Cellulitis    Cerebral hemorrhage (HCC) 11/21/2021   a.) CT head 11/21/2021 --> 1.4 x 1.5 x 3.1 cm ACUTE hemorrhage within the inferior aspect of the fourth ventricle, and extending inferiorly through the foramen of foramen of Magendie --> occurred in setting of HTN emergency and DOAC/APT-->eliquis/plavix d/c'd.   Cerebral microvascular disease    Chronic diarrhea    Chronic heart failure with preserved ejection fraction (HFpEF) (HCC) 07/19/2017   a.) 06/2017 Echo: EF 75%; b.) 11/2020 Echo: EF 55-60%; c.) 04/2021 Echo: EF 55-60%; d.) 11/2021 Echo: EF 65-70%, GrII DD, nl RV fxn, sev dil LA, large circumferential pericardial  eff w/o tamponade, triv MR, AoV sclerosis. Ao root 40mm; e.) TTE 02/28/2025: EF 60-65%, sev LVH, mod posterior LV effusion, AoV sclerosis   Chronic pain of both ankles    Coronary artery disease 02/26/2020   a. 02/2020 Ant STEMI/PCI: LM nl, LAD 53m (3.5x30 Resolute Onyx), RI nl, LCX min irregs, RCA min irregs. EF 65%.   DDD (degenerative disc disease), cervical    Diabetes mellitus, type II (HCC)    Diabetic neuropathy (HCC)    Esophagitis    ESRD (end stage renal disease) on dialysis (HCC)    a.) [facility]; T-Th-Sat   Frequent falls    Gait instability    Gastritis    Gastroparesis    GERD (gastroesophageal reflux disease)    H/O enucleation of left eyeball    Heart palpitations    History of kidney stones    HLD (hyperlipidemia)    Hypertension    Insomnia    a.) uses melatonin PRN   Long term current use of amiodarone    Nausea and vomiting in adult    recurrent   NSTEMI (non-ST elevated myocardial infarction) (HCC) 07/26/2021   a.) hyperkalemic from missed HD; seizures; decompensated to cardiac arrest and required CPR x 1 round prior to ROSC.   Osteomyelitis (HCC)    Pericardial effusion    a. 11/2021 Echo: EF 65-70%, GrII DD, nl RV fxn, sev dil LA, large circumferential pericardial eff w/o tamponade, triv  MR, AoV sclerosis. Ao root 40mm.   Seizure (HCC)    a.) last 07/26/2021 in setting of missed HD --> hyperkalemic at 6.5 --> pulseless/apneic and required CPR; discharged home on levetiracetam.   ST elevation myocardial infarction (STEMI) of anterior wall (HCC) 02/26/2020   a.) LHC/PCI 02/26/2020 --> EF 65%; LVEDP 11 mmHg; 90% mLAD (3.5 x 20 mm Resolute Onyx DES x 1)   Past Surgical History:  Procedure Laterality Date   A/V FISTULAGRAM Left 08/31/2021   Procedure: A/V Fistulagram;  Surgeon: Annice Needy, MD;  Location: [name] INVASIVE CV LAB;  Service: Cardiovascular;  Laterality: Left;   A/V FISTULAGRAM Left 01/25/2022   Procedure: A/V Fistulagram;  Surgeon: Annice Needy, MD;   Location: [name] INVASIVE CV LAB;  Service: Cardiovascular;  Laterality: Left;   AMPUTATION Left 03/16/2016   Procedure: AMPUTATION DIGIT LEFT HALLUX;  Surgeon: Vivi Barrack, DPM;  Location: [facility] OR;  Service: Podiatry;  Laterality: Left;  can start around 5    AMPUTATION Right 02/09/2021   Procedure: AMPUTATION DIGIT;  Surgeon: Marlyne Beards, MD;  Location: [facility] OR;  Service: Orthopedics;  Laterality: Right;   AMPUTATION TOE Right 02/28/2022   Procedure: AMPUTATION TOE;  Surgeon: Louann Sjogren, DPM;  Location: [name] ORS;  Service: Podiatry;  Laterality: Right;  right fifth toe   ARTHROSCOPIC REPAIR ACL Left    AV FISTULA PLACEMENT Right 05/31/2019   Procedure: Brachiocephalic AV fistula creation;  Surgeon: Annice Needy, MD;  Location: [name] ORS;  Service: Vascular;  Laterality: Right;   AV FISTULA PLACEMENT Left 08/13/2021   Procedure: ARTERIOVENOUS (AV) FISTULA CREATION (BRACHIALCEPHALIC);  Surgeon: Annice Needy, MD;  Location: [name] ORS;  Service: Vascular;  Laterality: Left;   COLONOSCOPY WITH PROPOFOL N/A 10/28/2015   Procedure: COLONOSCOPY WITH PROPOFOL;  Surgeon: Christena Deem, MD;  Location: [facility] ENDOSCOPY;  Service: Endoscopy;  Laterality: N/A;   COLONOSCOPY WITH PROPOFOL N/A 10/29/2015   Procedure: COLONOSCOPY WITH PROPOFOL;  Surgeon: Christena Deem, MD;  Location: [facility] ENDOSCOPY;  Service: Endoscopy;  Laterality: N/A;   CORONARY ULTRASOUND/IVUS N/A 02/26/2020   Procedure: Intravascular Ultrasound/IVUS;  Surgeon: Corky Crafts, MD;  Location: [facility] INVASIVE CV LAB;  Service: Cardiovascular;  Laterality: N/A;   CORONARY/GRAFT ACUTE MI REVASCULARIZATION N/A 02/26/2020   Procedure: Coronary/Graft Acute MI Revascularization;  Surgeon: Corky Crafts, MD;  Location: [facility] INVASIVE CV LAB;  Service: Cardiovascular;  Laterality: N/A;   DIALYSIS/PERMA CATHETER INSERTION Right 04/26/2019   Perm Cath    DIALYSIS/PERMA CATHETER INSERTION N/A 04/26/2019   Procedure: DIALYSIS/PERMA  CATHETER INSERTION;  Surgeon: Annice Needy, MD;  Location: [name] INVASIVE CV LAB;  Service: Cardiovascular;  Laterality: N/A;   DIALYSIS/PERMA CATHETER INSERTION N/A 05/25/2021   Procedure: DIALYSIS/PERMA CATHETER INSERTION;  Surgeon: Annice Needy, MD;  Location: [name] INVASIVE CV LAB;  Service: Cardiovascular;  Laterality: N/A;   DIALYSIS/PERMA CATHETER INSERTION N/A 08/31/2021   Procedure: DIALYSIS/PERMA CATHETER INSERTION;  Surgeon: Annice Needy, MD;  Location: [name] INVASIVE CV LAB;  Service: Cardiovascular;  Laterality: N/A;   DIALYSIS/PERMA CATHETER REMOVAL N/A 08/15/2019   Procedure: DIALYSIS/PERMA CATHETER REMOVAL;  Surgeon: Renford Dills, MD;  Location: [name] INVASIVE CV LAB;  Service: Cardiovascular;  Laterality: N/A;   DIALYSIS/PERMA CATHETER REMOVAL N/A 03/15/2022   Procedure: DIALYSIS/PERMA CATHETER REMOVAL;  Surgeon: Annice Needy, MD;  Location: [name] INVASIVE CV LAB;  Service: Cardiovascular;  Laterality: N/A;   EMBOLIZATION Right 02/06/2021   Procedure: EMBOLIZATION;  Surgeon: Annice Needy, MD;  Location: [facility] INVASIVE  CV LAB;  Service: Cardiovascular;  Laterality: Right;  Right Upper Extremity Dialysis Access, Permcath Placement   ESOPHAGOGASTRODUODENOSCOPY (EGD) WITH PROPOFOL N/A 12/27/2017   Procedure: ESOPHAGOGASTRODUODENOSCOPY (EGD) WITH PROPOFOL;  Surgeon: Toledo, Boykin Nearing, MD;  Location: [name] ENDOSCOPY;  Service: Gastroenterology;  Laterality: N/A;   EYE SURGERY     LEFT HEART CATH AND CORONARY ANGIOGRAPHY N/A 02/26/2020   Procedure: LEFT HEART CATH AND CORONARY ANGIOGRAPHY;  Surgeon: Corky Crafts, MD;  Location: [facility] INVASIVE CV LAB;  Service: Cardiovascular;  Laterality: N/A;   PROSTATE SURGERY  2016   TONSILECTOMY/ADENOIDECTOMY WITH MYRINGOTOMY     TONSILLECTOMY     UPPER EXTREMITY ANGIOGRAPHY Right 11/26/2020   Procedure: Upper Extremity Angiography;  Surgeon: Annice Needy, MD;  Location: [name] INVASIVE CV LAB;  Service: Cardiovascular;  Laterality: Right;   UPPER  EXTREMITY ANGIOGRAPHY Right 02/05/2021   Procedure: UPPER EXTREMITY ANGIOGRAPHY;  Surgeon: Annice Needy, MD;  Location: [name] INVASIVE CV LAB;  Service: Cardiovascular;  Laterality: Right;   Family History  Problem Relation Age of Onset   CAD Father    Stroke Father    Diabetes Mellitus II Mother    Kidney failure Mother    Schizophrenia Mother    Social History   Tobacco Use   Smoking status: Never   Smokeless tobacco: Never  Vaping Use   Vaping Use: Never used  Substance Use Topics   Alcohol use: No   Drug use: No    Pertinent Clinical Results:  LABS:   No visits with results within 3 Day(s) from this visit.  Latest known visit with results is:  Admission on 06/08/2022, Discharged on 06/08/2022  Component Date Value Ref Range Status   Glucose-Capillary 06/08/2022 151 (H)  70 - 99 mg/dL Final   Glucose reference range applies only to samples taken after fasting for at least 8 hours.   Sodium 06/08/2022 139  135 - 145 mmol/L Final   Potassium 06/08/2022 3.8  3.5 - 5.1 mmol/L Final   Chloride 06/08/2022 111  98 - 111 mmol/L Final   CO2 06/08/2022 14 (L)  22 - 32 mmol/L Final   Glucose, Bld 06/08/2022 51 (L)  70 - 99 mg/dL Final   Glucose reference range applies only to samples taken after fasting for at least 8 hours.   BUN 06/08/2022 78 (H)  6 - 20 mg/dL Final   Creatinine, Ser 06/08/2022 12.98 (H)  0.61 - 1.24 mg/dL Final   Calcium 16/11/9602 8.5 (L)  8.9 - 10.3 mg/dL Final   Total Protein 54/10/8117 6.9  6.5 - 8.1 g/dL Final   Albumin 14/78/2956 3.7  3.5 - 5.0 g/dL Final   AST 21/30/8657 9 (L)  15 - 41 U/L Final   ALT 06/08/2022 12  0 - 44 U/L Final   Alkaline Phosphatase 06/08/2022 82  38 - 126 U/L Final   Total Bilirubin 06/08/2022 0.9  0.3 - 1.2 mg/dL Final   GFR, Estimated 06/08/2022 4 (L)  >60 mL/min Final   Comment: (NOTE) Calculated using the CKD-EPI Creatinine Equation (2021)    Anion gap 06/08/2022 14  5 - 15 Final   Performed at [facility], [address], [place], [place] [address]   WBC 06/08/2022 6.4  4.0 - 10.5 K/uL Final   RBC 06/08/2022 5.28  4.22 - 5.81 MIL/uL Final   Hemoglobin 06/08/2022 11.7 (L)  13.0 - 17.0 g/dL Final   HCT 29/52/8413 37.4 (L)  39.0 - 52.0 % Final   MCV 06/08/2022  70.8 (L)  80.0 - 100.0 fL Final   MCH 06/08/2022 22.2 (L)  26.0 - 34.0 pg Final   MCHC 06/08/2022 31.3  30.0 - 36.0 g/dL Final   RDW 78/46/9629 24.7 (H)  11.5 - 15.5 % Final   Platelets 06/08/2022 105 (L)  150 - 400 K/uL Final   Comment: Immature Platelet Fraction may be clinically indicated, consider ordering this additional test [id number]    nRBC 06/08/2022 1.4 (H)  0.0 - 0.2 % Final   Neutrophils Relative % 06/08/2022 64  % Final   Neutro Abs 06/08/2022 4.2  1.7 - 7.7 K/uL Final   Lymphocytes Relative 06/08/2022 15  % Final   Lymphs Abs 06/08/2022 0.9  0.7 - 4.0 K/uL Final   Monocytes Relative 06/08/2022 13  % Final   Monocytes Absolute 06/08/2022 0.9  0.1 - 1.0 K/uL Final   Eosinophils Relative 06/08/2022 6  % Final   Eosinophils Absolute 06/08/2022 0.4  0.0 - 0.5 K/uL Final   Basophils Relative 06/08/2022 1  % Final   Basophils Absolute 06/08/2022 0.1  0.0 - 0.1 K/uL Final   WBC Morphology 06/08/2022 MORPHOLOGY UNREMARKABLE   Final   RBC Morphology 06/08/2022 ANISOCYTOSIS   Final   Smear Review 06/08/2022 MORPHOLOGY UNREMARKABLE   Final   Immature Granulocytes 06/08/2022 1  % Final   Abs Immature Granulocytes 06/08/2022 0.06  0.00 - 0.07 K/uL Final   Schistocytes 06/08/2022 PRESENT   Final   Polychromasia 06/08/2022 PRESENT   Final   Target Cells 06/08/2022 PRESENT   Final   Ovalocytes 06/08/2022 PRESENT   Final   Performed at [facility], [name], [place], [place] [address]   Glucose-Capillary 06/08/2022 146 (H)  70 - 99 mg/dL Final   Glucose reference range applies only to samples taken after fasting for at least 8 hours.    ECG: Date: 06/08/2022 Time ECG obtained: 0946 AM Rate: 76 bpm Rhythm:  Sinus rhythm Axis (leads  I and aVF): Normal Intervals: PR 182 ms. QRS 121 ms. QTc 490 ms. ST segment and T wave changes: LVH with secondary repolarization abnormality.  Anterior STE probably due to LVH. Borderline prolonged QT interval  Comparison: Similar to previous tracing obtained on 04/06/2022   IMAGING / PROCEDURES: DIAGNOSTIC RADIOGRAPHS OF CHEST PORTABLE 1 VIEW performed on 06/08/2022 LEFT chest wall dialysis catheter was identified with tip in the cavoatrial junction. Enlarged cardiopericardial silhouette History of known pericardial effusion No consolidation, pneumothorax, or pulmonary effusion No acute cardiopulmonary process   CT HEAD WO CONTRAST performed on 04/06/2022 No acute intracranial abnormality. 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  CT CERVICAL SPINE WO CONTRAST performed on 03/04/2022 Stable age advanced cerebral atrophy, ventriculomegaly, and periventricular white matter disease No acute intracranial findings or skull fractures Normal alignment of the cervical spine with no acute cervical spine fracture  TRANSTHORACIC ECHOCARDIOGRAM performed on 02/28/2022 Left ventricular ejection fraction, by estimation, is 60 to 65%. The left ventricle has normal function. The left ventricle has no regional wall motion abnormalities. There is severe concentric left ventricular hypertrophy.  Right ventricular systolic function is normal. The right ventricular size is normal. Tricuspid regurgitation signal is inadequate for assessing PA pressure.  The mitral valve is normal in structure. No evidence of mitral valve regurgitation. No evidence of mitral stenosis. Severe mitral annular calcification.  The aortic valve is normal in structure. Aortic valve regurgitation is  not visualized. Aortic valve sclerosis/calcification is present, without any  evidence of aortic stenosis.  The inferior vena cava is normal in size with greater than 50% respiratory variability, suggesting right atrial pressure of 3 mmHg.  There is a moderate circumferential pericardial with greatest diameter measuring 2.84cm. There is RA inversion but no RV diastolic collapse. There is no significant respirophasic changes in MV inflow velocities. The IVC is not dilated. There is no evidence of cardiac tamponade.  Compared to study dated 02/26/20, the pericardial effusion is slightly larger. Also, with severe LVH would consider PYP scan to rule out amyloidosis.   LEFT HEART CATHETERIZATION AND CORONARY ANGIOGRAPHY performed on 02/26/2020 Normal left ventricular systolic function with an EF of 65% LVEDP 11 mmHg despite 2.4 L normal saline during transport.  Question dehydration after 3 L fluid removal during hemodialysis. No aortic valve stenosis Single-vessel CAD 90% stenosis of the mid LAD Successful PCI 3.5 x 30 mm resolute Onyx stent, post dilated to greater than 4 mm, placed to the mid LAD resulting in 0% residual stenosis and TIMI-3 flow    Impression and Plan:  [name] Glori Luis. has been referred for pre-anesthesia review and clearance prior to him undergoing the planned anesthetic and procedural courses. Available labs, pertinent testing, and imaging results were personally reviewed by me in preparation for upcoming operative/procedural course. [facility] medical record has been updated following extensive record review and patient interview with PAT staff.   This patient has been appropriately cleared by cardiology with an overall ACCEPTABLE risk of experiencing significant perioperative cardiovascular complications. Based on clinical review performed today (07/20/22), barring any significant acute changes in the patient's overall condition, it is anticipated that he will be able to proceed with the planned surgical intervention. Any acute changes in clinical  condition may necessitate his procedure being postponed and/or cancelled. Patient will meet with anesthesia team (MD and/or CRNA) on the day of his procedure for preoperative evaluation/assessment. Questions regarding anesthetic course will be fielded at that time.   Pre-surgical instructions were reviewed with the patient during his PAT appointment, and questions were fielded to satisfaction by PAT clinical staff. He has been instructed on which medications that he will need to hold prior to surgery, as well as the ones that have been deemed safe/appropriate to take on the day of his procedure. As part of the general education provided by PAT, patient made aware both verbally and in writing, that he would need to abstain from the use of any illegal substances during his perioperative course.  He was advised that failure to follow the provided instructions could necessitate case cancellation or result in serious perioperative complications up to and including death. Patient encouraged to contact PAT and/or his surgeon's office to discuss any questions or concerns that may arise prior to surgery; verbalized understanding.   Quentin Mulling, MSN, APRN, FNP-C, CEN [facility]  Peri-operative Services Nurse Practitioner Phone: [phone] Fax: [phone] 07/20/22 12:01 PM  NOTE: This note has been prepared using Dragon dictation software. Despite my best ability to proofread, there is always the potential that unintentional transcriptional errors may still occur from this process.

## 2022-07-22 ENCOUNTER — Ambulatory Visit: Payer: Medicare HMO | Admitting: Urgent Care

## 2022-07-22 ENCOUNTER — Ambulatory Visit
Admission: RE | Admit: 2022-07-22 | Discharge: 2022-07-22 | Disposition: A | Discharge status: Home / Self Care | Payer: Medicare HMO | Attending: Vascular Surgery | Admitting: Vascular Surgery

## 2022-07-22 ENCOUNTER — Encounter: Payer: Self-pay | Admitting: Vascular Surgery

## 2022-07-22 ENCOUNTER — Other Ambulatory Visit: Payer: Self-pay

## 2022-07-22 ENCOUNTER — Encounter
Admission: RE | Disposition: A | Discharge status: Home / Self Care | Payer: Self-pay | Source: Home / Self Care | Attending: Vascular Surgery

## 2022-07-22 DIAGNOSIS — T82868A Thrombosis of vascular prosthetic devices, implants and grafts, initial encounter: Secondary | ICD-10-CM

## 2022-07-22 DIAGNOSIS — E1140 Type 2 diabetes mellitus with diabetic neuropathy, unspecified: Secondary | ICD-10-CM | POA: Diagnosis not present

## 2022-07-22 DIAGNOSIS — N186 End stage renal disease: Secondary | ICD-10-CM | POA: Insufficient documentation

## 2022-07-22 DIAGNOSIS — E1122 Type 2 diabetes mellitus with diabetic chronic kidney disease: Secondary | ICD-10-CM | POA: Diagnosis not present

## 2022-07-22 DIAGNOSIS — E1165 Type 2 diabetes mellitus with hyperglycemia: Secondary | ICD-10-CM | POA: Insufficient documentation

## 2022-07-22 DIAGNOSIS — Z9889 Other specified postprocedural states: Secondary | ICD-10-CM

## 2022-07-22 DIAGNOSIS — I4891 Unspecified atrial fibrillation: Secondary | ICD-10-CM | POA: Insufficient documentation

## 2022-07-22 DIAGNOSIS — Z992 Dependence on renal dialysis: Secondary | ICD-10-CM | POA: Diagnosis not present

## 2022-07-22 DIAGNOSIS — Z8673 Personal history of transient ischemic attack (TIA), and cerebral infarction without residual deficits: Secondary | ICD-10-CM | POA: Insufficient documentation

## 2022-07-22 DIAGNOSIS — Y832 Surgical operation with anastomosis, bypass or graft as the cause of abnormal reaction of the patient, or of later complication, without mention of misadventure at the time of the procedure: Secondary | ICD-10-CM | POA: Insufficient documentation

## 2022-07-22 DIAGNOSIS — I2510 Atherosclerotic heart disease of native coronary artery without angina pectoris: Secondary | ICD-10-CM | POA: Diagnosis not present

## 2022-07-22 DIAGNOSIS — R569 Unspecified convulsions: Secondary | ICD-10-CM | POA: Insufficient documentation

## 2022-07-22 DIAGNOSIS — E1121 Type 2 diabetes mellitus with diabetic nephropathy: Secondary | ICD-10-CM

## 2022-07-22 DIAGNOSIS — K3184 Gastroparesis: Secondary | ICD-10-CM | POA: Diagnosis not present

## 2022-07-22 DIAGNOSIS — I132 Hypertensive heart and chronic kidney disease with heart failure and with stage 5 chronic kidney disease, or end stage renal disease: Secondary | ICD-10-CM | POA: Diagnosis not present

## 2022-07-22 DIAGNOSIS — I5032 Chronic diastolic (congestive) heart failure: Secondary | ICD-10-CM | POA: Insufficient documentation

## 2022-07-22 DIAGNOSIS — Z794 Long term (current) use of insulin: Secondary | ICD-10-CM | POA: Insufficient documentation

## 2022-07-22 DIAGNOSIS — E785 Hyperlipidemia, unspecified: Secondary | ICD-10-CM | POA: Insufficient documentation

## 2022-07-22 DIAGNOSIS — E1151 Type 2 diabetes mellitus with diabetic peripheral angiopathy without gangrene: Secondary | ICD-10-CM | POA: Insufficient documentation

## 2022-07-22 DIAGNOSIS — E1143 Type 2 diabetes mellitus with diabetic autonomic (poly)neuropathy: Secondary | ICD-10-CM | POA: Insufficient documentation

## 2022-07-22 DIAGNOSIS — Z79899 Other long term (current) drug therapy: Secondary | ICD-10-CM | POA: Insufficient documentation

## 2022-07-22 DIAGNOSIS — T82898A Other specified complication of vascular prosthetic devices, implants and grafts, initial encounter: Secondary | ICD-10-CM

## 2022-07-22 DIAGNOSIS — D631 Anemia in chronic kidney disease: Secondary | ICD-10-CM | POA: Insufficient documentation

## 2022-07-22 DIAGNOSIS — N400 Enlarged prostate without lower urinary tract symptoms: Secondary | ICD-10-CM | POA: Diagnosis not present

## 2022-07-22 DIAGNOSIS — T82848A Pain from vascular prosthetic devices, implants and grafts, initial encounter: Secondary | ICD-10-CM | POA: Insufficient documentation

## 2022-07-22 DIAGNOSIS — I252 Old myocardial infarction: Secondary | ICD-10-CM | POA: Diagnosis not present

## 2022-07-22 DIAGNOSIS — Z8674 Personal history of sudden cardiac arrest: Secondary | ICD-10-CM | POA: Diagnosis not present

## 2022-07-22 DIAGNOSIS — Z95828 Presence of other vascular implants and grafts: Secondary | ICD-10-CM

## 2022-07-22 HISTORY — DX: Anemia in chronic kidney disease: D63.1

## 2022-07-22 HISTORY — DX: Other long term (current) drug therapy: Z79.899

## 2022-07-22 HISTORY — DX: Other cervical disc degeneration, unspecified cervical region: M50.30

## 2022-07-22 HISTORY — PX: REVISON OF ARTERIOVENOUS FISTULA: SHX6074

## 2022-07-22 HISTORY — DX: Other cerebrovascular disease: I67.89

## 2022-07-22 HISTORY — DX: Chronic kidney disease, unspecified: N18.9

## 2022-07-22 LAB — POCT I-STAT, CHEM 8
BUN: 50 mg/dL — ABNORMAL HIGH (ref 6–20)
Calcium, Ion: 1.11 mmol/L — ABNORMAL LOW (ref 1.15–1.40)
Chloride: 111 mmol/L (ref 98–111)
Creatinine, Ser: 13.6 mg/dL — ABNORMAL HIGH (ref 0.61–1.24)
Glucose, Bld: 80 mg/dL (ref 70–99)
HCT: 37 % — ABNORMAL LOW (ref 39.0–52.0)
Hemoglobin: 12.6 g/dL — ABNORMAL LOW (ref 13.0–17.0)
Potassium: 3.8 mmol/L (ref 3.5–5.1)
Sodium: 141 mmol/L (ref 135–145)
TCO2: 17 mmol/L — ABNORMAL LOW (ref 22–32)

## 2022-07-22 LAB — TYPE AND SCREEN
ABO/RH(D): A POS
Antibody Screen: NEGATIVE

## 2022-07-22 LAB — BASIC METABOLIC PANEL
Anion gap: 14 (ref 5–15)
BUN: 69 mg/dL — ABNORMAL HIGH (ref 6–20)
CO2: 19 mmol/L — ABNORMAL LOW (ref 22–32)
Calcium: 8.5 mg/dL — ABNORMAL LOW (ref 8.9–10.3)
Chloride: 110 mmol/L (ref 98–111)
Creatinine, Ser: 12.2 mg/dL — ABNORMAL HIGH (ref 0.61–1.24)
GFR, Estimated: 4 mL/min — ABNORMAL LOW (ref >60)
Glucose, Bld: 83 mg/dL (ref 70–99)
Potassium: 3.8 mmol/L (ref 3.5–5.1)
Sodium: 143 mmol/L (ref 135–145)

## 2022-07-22 LAB — CBC WITH DIFFERENTIAL/PLATELET
Abs Immature Granulocytes: 0.01 10*3/uL (ref 0.00–0.07)
Basophils Absolute: 0.1 10*3/uL (ref 0.0–0.1)
Basophils Relative: 1 %
Eosinophils Absolute: 0.8 10*3/uL — ABNORMAL HIGH (ref 0.0–0.5)
Eosinophils Relative: 12 %
HCT: 40.6 % (ref 39.0–52.0)
Hemoglobin: 12.1 g/dL — ABNORMAL LOW (ref 13.0–17.0)
Immature Granulocytes: 0 %
Lymphocytes Relative: 17 %
Lymphs Abs: 1.1 10*3/uL (ref 0.7–4.0)
MCH: 21.9 pg — ABNORMAL LOW (ref 26.0–34.0)
MCHC: 29.8 g/dL — ABNORMAL LOW (ref 30.0–36.0)
MCV: 73.4 fL — ABNORMAL LOW (ref 80.0–100.0)
Monocytes Absolute: 0.7 10*3/uL (ref 0.1–1.0)
Monocytes Relative: 11 %
Neutro Abs: 3.8 10*3/uL (ref 1.7–7.7)
Neutrophils Relative %: 59 %
Platelets: 94 10*3/uL — ABNORMAL LOW (ref 150–400)
RBC: 5.53 MIL/uL (ref 4.22–5.81)
RDW: 22.8 % — ABNORMAL HIGH (ref 11.5–15.5)
Smear Review: NORMAL
WBC: 6.4 10*3/uL (ref 4.0–10.5)
nRBC: 0 % (ref 0.0–0.2)

## 2022-07-22 LAB — GLUCOSE, CAPILLARY
Glucose-Capillary: 68 mg/dL — ABNORMAL LOW (ref 70–99)
Glucose-Capillary: 69 mg/dL — ABNORMAL LOW (ref 70–99)
Glucose-Capillary: 70 mg/dL (ref 70–99)

## 2022-07-22 SURGERY — REVISON OF ARTERIOVENOUS FISTULA
Anesthesia: General | Site: Arm Upper | Laterality: Right

## 2022-07-22 MED ORDER — MIDAZOLAM HCL 2 MG/2ML IJ SOLN
INTRAMUSCULAR | Status: DC | PRN
  Administered 2022-07-22: 2 mg via INTRAVENOUS

## 2022-07-22 MED ORDER — EPHEDRINE SULFATE (PRESSORS) 50 MG/ML IJ SOLN
INTRAMUSCULAR | Status: DC | PRN
  Administered 2022-07-22: 10 mg via INTRAVENOUS

## 2022-07-22 MED ORDER — ROCURONIUM BROMIDE 100 MG/10ML IV SOLN
INTRAVENOUS | Status: DC | PRN
  Administered 2022-07-22: 30 mg via INTRAVENOUS

## 2022-07-22 MED ORDER — ORAL CARE MOUTH RINSE: 15.0000 mL | Freq: Once | OROMUCOSAL | Status: AC

## 2022-07-22 MED ORDER — OXYCODONE HCL 5 MG/5ML PO SOLN: 5.0000 mg | Freq: Once | ORAL | Status: DC | PRN

## 2022-07-22 MED ORDER — CHLORHEXIDINE GLUCONATE CLOTH 2 % EX PADS: 6.0000 | MEDICATED_PAD | Freq: Once | CUTANEOUS | Status: DC

## 2022-07-22 MED ORDER — GLYCOPYRROLATE 0.2 MG/ML IJ SOLN
INTRAMUSCULAR | Status: DC | PRN
  Administered 2022-07-22: .2 mg via INTRAVENOUS

## 2022-07-22 MED ORDER — ONDANSETRON HCL 4 MG/2ML IJ SOLN
INTRAMUSCULAR | Status: DC | PRN
  Administered 2022-07-22: 4 mg via INTRAVENOUS

## 2022-07-22 MED ORDER — FENTANYL CITRATE (PF) 100 MCG/2ML IJ SOLN
INTRAMUSCULAR | Status: AC
  Filled 2022-07-22: qty 2

## 2022-07-22 MED ORDER — PROPOFOL 10 MG/ML IV BOLUS
INTRAVENOUS | Status: AC
  Filled 2022-07-22: qty 20

## 2022-07-22 MED ORDER — FENTANYL CITRATE (PF) 100 MCG/2ML IJ SOLN
INTRAMUSCULAR | Status: DC | PRN
  Administered 2022-07-22 (×3): 25 ug via INTRAVENOUS

## 2022-07-22 MED ORDER — CHLORHEXIDINE GLUCONATE 0.12 % MT SOLN
OROMUCOSAL | Status: AC
  Filled 2022-07-22: qty 15

## 2022-07-22 MED ORDER — CEFAZOLIN SODIUM-DEXTROSE 2-4 GM/100ML-% IV SOLN
INTRAVENOUS | Status: AC
  Filled 2022-07-22: qty 100

## 2022-07-22 MED ORDER — SUGAMMADEX SODIUM 200 MG/2ML IV SOLN
INTRAVENOUS | Status: DC | PRN
  Administered 2022-07-22: 200 mg via INTRAVENOUS

## 2022-07-22 MED ORDER — MIDAZOLAM HCL 2 MG/2ML IJ SOLN
INTRAMUSCULAR | Status: AC
  Filled 2022-07-22: qty 2

## 2022-07-22 MED ORDER — SUCCINYLCHOLINE CHLORIDE 200 MG/10ML IV SOSY
PREFILLED_SYRINGE | INTRAVENOUS | Status: DC | PRN
  Administered 2022-07-22: 140 mg via INTRAVENOUS

## 2022-07-22 MED ORDER — ONDANSETRON HCL 4 MG/2ML IJ SOLN
INTRAMUSCULAR | Status: AC
  Filled 2022-07-22: qty 2

## 2022-07-22 MED ORDER — DEXAMETHASONE SODIUM PHOSPHATE 10 MG/ML IJ SOLN
INTRAMUSCULAR | Status: AC
  Filled 2022-07-22: qty 1

## 2022-07-22 MED ORDER — HEPARIN SODIUM (PORCINE) 5000 UNIT/ML IJ SOLN
INTRAMUSCULAR | Status: AC
  Filled 2022-07-22: qty 1

## 2022-07-22 MED ORDER — ROCURONIUM BROMIDE 10 MG/ML (PF) SYRINGE
PREFILLED_SYRINGE | INTRAVENOUS | Status: AC
  Filled 2022-07-22: qty 10

## 2022-07-22 MED ORDER — PROPOFOL 10 MG/ML IV BOLUS
INTRAVENOUS | Status: DC | PRN
  Administered 2022-07-22 (×2): 40 mg via INTRAVENOUS
  Administered 2022-07-22: 70 mg via INTRAVENOUS

## 2022-07-22 MED ORDER — FENTANYL CITRATE (PF) 100 MCG/2ML IJ SOLN: 25.0000 ug | INTRAMUSCULAR | Status: DC | PRN

## 2022-07-22 MED ORDER — ACETAMINOPHEN 10 MG/ML IV SOLN: 1000.0000 mg | Freq: Once | INTRAVENOUS | Status: DC | PRN

## 2022-07-22 MED ORDER — SODIUM CHLORIDE 0.9 % IV SOLN: INTRAVENOUS | Status: DC

## 2022-07-22 MED ORDER — DEXAMETHASONE SODIUM PHOSPHATE 10 MG/ML IJ SOLN
INTRAMUSCULAR | Status: DC | PRN
  Administered 2022-07-22: 10 mg via INTRAVENOUS

## 2022-07-22 MED ORDER — SUCCINYLCHOLINE CHLORIDE 200 MG/10ML IV SOSY
PREFILLED_SYRINGE | INTRAVENOUS | Status: AC
  Filled 2022-07-22: qty 10

## 2022-07-22 MED ORDER — CEFAZOLIN SODIUM-DEXTROSE 2-4 GM/100ML-% IV SOLN
2.0000 g | INTRAVENOUS | Status: AC
  Administered 2022-07-22: 2 g via INTRAVENOUS

## 2022-07-22 MED ORDER — OXYCODONE HCL 5 MG PO TABS: 5.0000 mg | ORAL_TABLET | Freq: Once | ORAL | Status: DC | PRN

## 2022-07-22 MED ORDER — CHLORHEXIDINE GLUCONATE 0.12 % MT SOLN
15.0000 mL | Freq: Once | OROMUCOSAL | Status: AC
  Administered 2022-07-22: 15 mL via OROMUCOSAL

## 2022-07-22 MED ORDER — HYDROCODONE-ACETAMINOPHEN 5-325 MG PO TABS: 2.0000 | ORAL_TABLET | Freq: Four times a day (QID) | ORAL | 0 refills | Status: DC | PRN

## 2022-07-22 MED ORDER — SODIUM CHLORIDE 0.9 % IV SOLN
INTRAVENOUS | Status: DC | PRN
  Administered 2022-07-22: 501 mL

## 2022-07-22 SURGICAL SUPPLY — 60 items
ADH SKN CLS APL DERMABOND .7 (GAUZE/BANDAGES/DRESSINGS) ×1
APL PRP STRL LF DISP 70% ISPRP (MISCELLANEOUS) ×1
BAG DECANTER FOR FLEXI CONT (MISCELLANEOUS) ×1 IMPLANT
BLADE SURG SZ11 CARB STEEL (BLADE) ×1 IMPLANT
BOOT SUTURE VASCULAR YLW (MISCELLANEOUS) ×1
BRUSH SCRUB EZ  4% CHG (MISCELLANEOUS) ×1
BRUSH SCRUB EZ 4% CHG (MISCELLANEOUS) ×1 IMPLANT
CHLORAPREP W/TINT 26 (MISCELLANEOUS) ×1 IMPLANT
CLAMP SUTURE YELLOW 5 PAIRS (MISCELLANEOUS) ×1 IMPLANT
CLIP SPRNG 6 S-JAW DBL (CLIP) ×1 IMPLANT
CLIP SPRNG 6MM S-JAW DBL (CLIP) ×1
DERMABOND ADVANCED .7 DNX12 (GAUZE/BANDAGES/DRESSINGS) ×1 IMPLANT
ELECT CAUTERY BLADE 6.4 (BLADE) ×1 IMPLANT
ELECT REM PT RETURN 9FT ADLT (ELECTROSURGICAL) ×1
ELECTRODE REM PT RTRN 9FT ADLT (ELECTROSURGICAL) ×1 IMPLANT
GLOVE BIO SURGEON STRL SZ7 (GLOVE) ×2 IMPLANT
GOWN STRL REUS W/ TWL LRG LVL3 (GOWN DISPOSABLE) ×2 IMPLANT
GOWN STRL REUS W/ TWL XL LVL3 (GOWN DISPOSABLE) ×1 IMPLANT
GOWN STRL REUS W/TWL LRG LVL3 (GOWN DISPOSABLE) ×2
GOWN STRL REUS W/TWL XL LVL3 (GOWN DISPOSABLE) ×1
HEMOSTAT SURGICEL 2X3 (HEMOSTASIS) ×1 IMPLANT
IV NS 500ML (IV SOLUTION) ×1
IV NS 500ML BAXH (IV SOLUTION) ×1 IMPLANT
KIT TURNOVER KIT A (KITS) ×1 IMPLANT
LABEL OR SOLS (LABEL) ×1 IMPLANT
LOOP VESSEL MAXI  1X406 RED (MISCELLANEOUS) ×1
LOOP VESSEL MAXI 1X406 RED (MISCELLANEOUS) ×1 IMPLANT
LOOP VESSEL MINI 0.8X406 BLUE (MISCELLANEOUS) ×1 IMPLANT
MANIFOLD NEPTUNE II (INSTRUMENTS) ×1 IMPLANT
NDL FILTER BLUNT 18X1 1/2 (NEEDLE) ×1 IMPLANT
NEEDLE FILTER BLUNT 18X1 1/2 (NEEDLE) ×1 IMPLANT
NS IRRIG 500ML POUR BTL (IV SOLUTION) ×1 IMPLANT
PACK EXTREMITY ARMC (MISCELLANEOUS) ×1 IMPLANT
PAD PREP OB/GYN DISP 24X41 (PERSONAL CARE ITEMS) ×1 IMPLANT
SOLUTION CELL SAVER (CLIP) ×1 IMPLANT
SPIKE FLUID TRANSFER (MISCELLANEOUS) ×1 IMPLANT
STOCKINETTE 48X4 2 PLY STRL (GAUZE/BANDAGES/DRESSINGS) ×1 IMPLANT
STOCKINETTE STRL 4IN 9604848 (GAUZE/BANDAGES/DRESSINGS) ×1 IMPLANT
SUT ETHILON 3-0 FS-10 30 BLK (SUTURE) ×1
SUT GORETEX CV-6TTC-13 36IN (SUTURE) IMPLANT
SUT MNCRL AB 4-0 PS2 18 (SUTURE) ×1 IMPLANT
SUT PROLENE 5 0 RB 1 DA (SUTURE) IMPLANT
SUT PROLENE 6 0 BV (SUTURE) ×4 IMPLANT
SUT SILK 2 0 (SUTURE)
SUT SILK 2 0 SH (SUTURE) ×1 IMPLANT
SUT SILK 2-0 18XBRD TIE 12 (SUTURE) ×1 IMPLANT
SUT SILK 3 0 (SUTURE)
SUT SILK 3-0 18XBRD TIE 12 (SUTURE) ×1 IMPLANT
SUT SILK 4 0 (SUTURE)
SUT SILK 4-0 18XBRD TIE 12 (SUTURE) ×1 IMPLANT
SUT VIC AB 3-0 SH 27 (SUTURE) ×1
SUT VIC AB 3-0 SH 27X BRD (SUTURE) ×1 IMPLANT
SUTURE EHLN 3-0 FS-10 30 BLK (SUTURE) IMPLANT
SYR 20ML LL LF (SYRINGE) ×1 IMPLANT
SYR 3ML LL SCALE MARK (SYRINGE) ×1 IMPLANT
SYR TOOMEY IRRIG 70ML (MISCELLANEOUS)
SYRINGE TOOMEY IRRIG 70ML (MISCELLANEOUS) IMPLANT
TAG SUTURE CLAMP YLW 5PR (MISCELLANEOUS) ×1
TRAP FLUID SMOKE EVACUATOR (MISCELLANEOUS) ×1 IMPLANT
WATER STERILE IRR 500ML POUR (IV SOLUTION) ×1 IMPLANT

## 2022-07-22 NOTE — Transfer of Care (Signed)
Immediate Anesthesia Transfer of Care Note  Patient: [name].  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  Last Pain:  Vitals:   07/22/22 0742  PainSc: 0-No pain         Complications: No notable events documented.

## 2022-07-22 NOTE — Op Note (Signed)
[facility]   OPERATIVE NOTE  DATE: 07/22/2022  PRE-OPERATIVE DIAGNOSIS: Painful, aneurysmal, nonfunctional right brachiocephalic AV fistula  POST-OPERATIVE DIAGNOSIS: same as above  PROCEDURE: 1.   Ligation and resection of aneurysmal portion of right brachiocephalic AV fistula  SURGEON: Festus Barren  ASSISTANT(S): None  ANESTHESIA: general  ESTIMATED BLOOD LOSS: 25 cc  FINDING(S): 1.  Partially thrombosed but aneurysmal right brachiocephalic AV fistula   INDICATIONS:   [name]. is a 53 y.o. male who presents with a right brachiocephalic fistula that is not being used.  It has had coil embolization for steal syndrome in the past.  He continues to have some flow and is partially thrombosed but is quite aneurysmal and painful and he desires to have this excised.  Risks and benefits were discussed and informed consent was obtained.  DESCRIPTION: After obtaining full informed written consent, the patient was brought back to the operating room and placed supine upon the operating table.  The patient was prepped and draped in the standard fashion.  An elliptical incision was made overlying the aneurysmal portion of the right brachiocephalic AV fistula.  It was then dissected out up to the completely thrombosed nonaneurysmal portion.  It was then dissected down near the brachiocephalic anastomosis at the antecubital fossa.  It was then clamped proximally and distally and excised.  In the nonaneurysmal portion in the proximal to mid upper arm, it was oversewn with a 2-0 silk suture ligature.  In the area near the brachiocephalic anastomosis, a running 5-0 Prolene suture and a 2-0 silk suture ligature were used to ligate the fistula.  The aneurysmal sac was excised and sent off as a specimen.  The wound was then copiously irrigated.  It was then closed with a running 3-0 Vicryl and a 4-0 Monocryl.  Dermabond was placed as a dressing. At this point, we elected to  complete the procedure.  The patient was taken to the recovery room in stable condition.   COMPLICATIONS: None  CONDITION: Stable  Festus Barren  07/22/2022, 10:34 AM    This note was created with Dragon Medical transcription system. Any errors in dictation are purely unintentional.

## 2022-07-22 NOTE — Anesthesia Preprocedure Evaluation (Signed)
Anesthesia Evaluation  Patient identified by MRN, date of birth, ID band Patient awake    Reviewed: Allergy & Precautions, H&P , NPO status , Patient's Chart, lab work & pertinent test results, reviewed documented beta blocker date and time   Airway Mallampati: II  TM Distance: >3 FB Neck ROM: full    Dental  (+) Teeth Intact   Pulmonary pneumonia, resolved   Pulmonary exam normal        Cardiovascular Exercise Tolerance: Poor hypertension, On Medications + CAD, + Past MI and + Peripheral Vascular Disease  Normal cardiovascular exam Rhythm:regular Rate:Normal     Neuro/Psych Seizures -, Well Controlled,  PSYCHIATRIC DISORDERS Anxiety Depression    CVA    GI/Hepatic Neg liver ROS,GERD  Medicated,,  Endo/Other  negative endocrine ROSdiabetes    Renal/GU ESRF and DialysisRenal disease  negative genitourinary   Musculoskeletal   Abdominal   Peds  Hematology  (+) Blood dyscrasia, anemia   Anesthesia Other Findings Past Medical History: 04/21/2021: Acute bilateral cerebral infarction in a watershed  distribution [name])     Comment:  a.) MRI brain 04/21/2021 --> patchy ACUTE               cortically-based infarcts within the bilateral high               frontal lobes and right parietal lobe 02/13/2021: Acute cerebral infarction [facility])     Comment:  a.) MRI brain 02/13/2021: acute LEFT hippocampal infarct 04/21/2021: Acute cerebral infarction  [facility])     Comment:  a.) MRI brain 04/21/2021: subcentimeter ACUTE infarct               within the callosal splenium on the LEFT No date: Anemia of chronic renal failure No date: Anxiety 11/25/2020: Aortic dilatation (HCC)     Comment:  a.) TTE 11/25/2020: Ao root measured 38 mm. b.) TTE               04/22/2021: Ao root measured 44 mm; c. 11/2021 Echo: Ao               root 40mm. No date: Atrial fibrillation (HCC)     Comment:  a.) CHA2DS2-VASc = 6 (CHF, HTN, CVA x2, prior MI,  T2DM).              b.) rate/rhythm maintained on oral amiodarone +               carvedilol; chronic OAC/AP therapy discontinued following              ICH No date: BPH (benign prostatic hyperplasia) 07/26/2021: Cardiac arrest [facility])     Comment:  a.) in setting of hyperkalemia, NSTEMI, and seizure               following missed HD; pulseless and apneic --> required 1               round of CPR prior to ROSC No date: Cellulitis 11/21/2021: Cerebral hemorrhage (HCC)     Comment:  a.) CT head 11/21/2021 --> 1.4 x 1.5 x 3.1 cm ACUTE               hemorrhage within the inferior aspect of the fourth               ventricle, and extending inferiorly through the foramen               of foramen of [place] --> occurred in setting of HTN  emergency and DOAC/APT-->eliquis/plavix d/c'd. No date: Cerebral microvascular disease No date: Chronic diarrhea 07/19/2017: Chronic heart failure with preserved ejection fraction  (HFpEF) (HCC)     Comment:  a.) 06/2017 Echo: EF 75%; b.) 11/2020 Echo: EF 55-60%;               c.) 04/2021 Echo: EF 55-60%; d.) 11/2021 Echo: EF 65-70%,               GrII DD, nl RV fxn, sev dil LA, large circumferential               pericardial eff w/o tamponade, triv MR, AoV sclerosis. Ao              root 40mm; e.) TTE 02/28/2025: EF 60-65%, sev LVH, mod               posterior LV effusion, AoV sclerosis No date: Chronic pain of both ankles 02/26/2020: Coronary artery disease     Comment:  a. 02/2020 Ant STEMI/PCI: LM nl, LAD 47m (3.5x30 Resolute              Onyx), RI nl, LCX min irregs, RCA min irregs. EF 65%. No date: DDD (degenerative disc disease), cervical No date: Diabetes mellitus, type II (HCC) No date: Diabetic neuropathy (HCC) No date: Esophagitis No date: ESRD (end stage renal disease) on dialysis (HCC)     Comment:  a.) [facility]; T-Th-Sat No date: Frequent falls No date: Gait instability No date: Gastritis No date: Gastroparesis No date: GERD  (gastroesophageal reflux disease) No date: H/O enucleation of left eyeball No date: Heart palpitations No date: History of kidney stones No date: HLD (hyperlipidemia) No date: Hypertension No date: Insomnia     Comment:  a.) uses melatonin PRN No date: Long term current use of amiodarone No date: Nausea and vomiting in adult     Comment:  recurrent 07/26/2021: NSTEMI (non-ST elevated myocardial infarction) (HCC)     Comment:  a.) hyperkalemic from missed HD; seizures; decompensated              to cardiac arrest and required CPR x 1 round prior to               ROSC. No date: Osteomyelitis (HCC) No date: Pericardial effusion     Comment:  a. 11/2021 Echo: EF 65-70%, GrII DD, nl RV fxn, sev dil               LA, large circumferential pericardial eff w/o tamponade,               triv MR, AoV sclerosis. Ao root 40mm. No date: Seizure [facility])     Comment:  a.) last 07/26/2021 in setting of missed HD -->               hyperkalemic at 6.5 --> pulseless/apneic and required               CPR; discharged home on levetiracetam. 02/26/2020: ST elevation myocardial infarction (STEMI) of anterior  wall (HCC)     Comment:  a.) LHC/PCI 02/26/2020 --> EF 65%; LVEDP 11 mmHg; 90%               mLAD (3.5 x 20 mm Resolute Onyx DES x 1) Past Surgical History: 08/31/2021: A/V FISTULAGRAM; Left     Comment:  Procedure: A/V Fistulagram;  Surgeon: Annice Needy, MD;               Location: [facility]  INVASIVE CV LAB;  Service: Cardiovascular;              Laterality: Left; 01/25/2022: A/V FISTULAGRAM; Left     Comment:  Procedure: A/V Fistulagram;  Surgeon: Annice Needy, MD;               Location: [name] INVASIVE CV LAB;  Service: Cardiovascular;              Laterality: Left; 03/16/2016: AMPUTATION; Left     Comment:  Procedure: AMPUTATION DIGIT LEFT HALLUX;  Surgeon:               Vivi Barrack, DPM;  Location: [facility] OR;  Service:               Podiatry;  Laterality: Left;  can start around 5  02/09/2021:  AMPUTATION; Right     Comment:  Procedure: AMPUTATION DIGIT;  Surgeon: Marlyne Beards, MD;  Location: [facility] OR;  Service: Orthopedics;                Laterality: Right; 02/28/2022: AMPUTATION TOE; Right     Comment:  Procedure: AMPUTATION TOE;  Surgeon: Louann Sjogren,               DPM;  Location: [name] ORS;  Service: Podiatry;                Laterality: Right;  right fifth toe No date: ARTHROSCOPIC REPAIR ACL; Left 05/31/2019: AV FISTULA PLACEMENT; Right     Comment:  Procedure: Brachiocephalic AV fistula creation;                Surgeon: Annice Needy, MD;  Location: [name] ORS;  Service:              Vascular;  Laterality: Right; 08/13/2021: AV FISTULA PLACEMENT; Left     Comment:  Procedure: ARTERIOVENOUS (AV) FISTULA CREATION               (BRACHIALCEPHALIC);  Surgeon: Annice Needy, MD;                Location: [name] ORS;  Service: Vascular;  Laterality:               Left; 10/28/2015: COLONOSCOPY WITH PROPOFOL; N/A     Comment:  Procedure: COLONOSCOPY WITH PROPOFOL;  Surgeon: Christena Deem, MD;  Location: [facility] ENDOSCOPY;  Service:               Endoscopy;  Laterality: N/A; 10/29/2015: COLONOSCOPY WITH PROPOFOL; N/A     Comment:  Procedure: COLONOSCOPY WITH PROPOFOL;  Surgeon: Christena Deem, MD;  Location: [facility] ENDOSCOPY;  Service:               Endoscopy;  Laterality: N/A; 02/26/2020: CORONARY ULTRASOUND/IVUS; N/A     Comment:  Procedure: Intravascular Ultrasound/IVUS;  Surgeon:               Corky Crafts, MD;  Location: [facility] INVASIVE CV LAB;               Service: Cardiovascular;  Laterality: N/A; 02/26/2020: CORONARY/GRAFT ACUTE MI REVASCULARIZATION; N/A     Comment:  Procedure: Coronary/Graft Acute MI Revascularization;  Surgeon: Corky Crafts, MD;  Location: [facility] INVASIVE              CV LAB;  Service: Cardiovascular;  Laterality: N/A; 04/26/2019: DIALYSIS/PERMA CATHETER INSERTION; Right     Comment:  Perm  Cath  04/26/2019: DIALYSIS/PERMA CATHETER INSERTION; N/A     Comment:  Procedure: DIALYSIS/PERMA CATHETER INSERTION;  Surgeon:               Annice Needy, MD;  Location: [name] INVASIVE CV LAB;                Service: Cardiovascular;  Laterality: N/A; 05/25/2021: DIALYSIS/PERMA CATHETER INSERTION; N/A     Comment:  Procedure: DIALYSIS/PERMA CATHETER INSERTION;  Surgeon:               Annice Needy, MD;  Location: [name] INVASIVE CV LAB;                Service: Cardiovascular;  Laterality: N/A; 08/31/2021: DIALYSIS/PERMA CATHETER INSERTION; N/A     Comment:  Procedure: DIALYSIS/PERMA CATHETER INSERTION;  Surgeon:               Annice Needy, MD;  Location: [name] INVASIVE CV LAB;                Service: Cardiovascular;  Laterality: N/A; 08/15/2019: DIALYSIS/PERMA CATHETER REMOVAL; N/A     Comment:  Procedure: DIALYSIS/PERMA CATHETER REMOVAL;  Surgeon:               Renford Dills, MD;  Location: [name] INVASIVE CV LAB;               Service: Cardiovascular;  Laterality: N/A; 03/15/2022: DIALYSIS/PERMA CATHETER REMOVAL; N/A     Comment:  Procedure: DIALYSIS/PERMA CATHETER REMOVAL;  Surgeon:               Annice Needy, MD;  Location: [name] INVASIVE CV LAB;                Service: Cardiovascular;  Laterality: N/A; 02/06/2021: EMBOLIZATION; Right     Comment:  Procedure: EMBOLIZATION;  Surgeon: Annice Needy, MD;                Location: [name] INVASIVE CV LAB;  Service: Cardiovascular;              Laterality: Right;  Right Upper Extremity Dialysis               Access, Permcath Placement 12/27/2017: ESOPHAGOGASTRODUODENOSCOPY (EGD) WITH PROPOFOL; N/A     Comment:  Procedure: ESOPHAGOGASTRODUODENOSCOPY (EGD) WITH               PROPOFOL;  Surgeon: Toledo, Boykin Nearing, MD;  Location:               [name] ENDOSCOPY;  Service: Gastroenterology;  Laterality:               N/A; No date: EYE SURGERY 02/26/2020: LEFT HEART CATH AND CORONARY ANGIOGRAPHY; N/A     Comment:  Procedure: LEFT HEART CATH AND CORONARY  ANGIOGRAPHY;                Surgeon: Corky Crafts, MD;  Location: [facility] INVASIVE              CV LAB;  Service: Cardiovascular;  Laterality: N/A; 2016: PROSTATE SURGERY No date: TONSILECTOMY/ADENOIDECTOMY WITH MYRINGOTOMY No date: TONSILLECTOMY 11/26/2020: UPPER EXTREMITY ANGIOGRAPHY; Right     Comment:  Procedure: Upper Extremity Angiography;  Surgeon:  Annice Needy, MD;  Location: [name] INVASIVE CV LAB;  Service:               Cardiovascular;  Laterality: Right; 02/05/2021: UPPER EXTREMITY ANGIOGRAPHY; Right     Comment:  Procedure: UPPER EXTREMITY ANGIOGRAPHY;  Surgeon: Annice Needy, MD;  Location: [name] INVASIVE CV LAB;  Service:               Cardiovascular;  Laterality: Right; BMI    Body Mass Index: 22.49 kg/m     Reproductive/Obstetrics negative OB ROS                             Anesthesia Physical Anesthesia Plan  ASA: 4  Anesthesia Plan: General ETT   Post-op Pain Management:    Induction:   PONV Risk Score and Plan: 3  Airway Management Planned:   Additional Equipment:   Intra-op Plan:   Post-operative Plan:   Informed Consent: I have reviewed the patients History and Physical, chart, labs and discussed the procedure including the risks, benefits and alternatives for the proposed anesthesia with the patient or authorized representative who has indicated his/her understanding and acceptance.     Dental Advisory Given  Plan Discussed with: CRNA  Anesthesia Plan Comments:        Anesthesia Quick Evaluation

## 2022-07-22 NOTE — Discharge Instructions (Signed)

## 2022-07-22 NOTE — Anesthesia Postprocedure Evaluation (Signed)
Anesthesia Post Note  Patient: [name].  Procedure(s) Performed: RESECTION OF ANEURYSMAL RIGHT ARM ARTERIOVENOUS FISTULA (Right: Arm Upper)  Patient location during evaluation: PACU Anesthesia Type: General Level of consciousness: awake and alert Pain management: pain level controlled Vital Signs Assessment: post-procedure vital signs reviewed and stable Respiratory status: spontaneous breathing, nonlabored ventilation, respiratory function stable and patient connected to nasal cannula oxygen Cardiovascular status: blood pressure returned to baseline and stable Postop Assessment: no apparent nausea or vomiting Anesthetic complications: no   No notable events documented.   Last Vitals:  Vitals:   07/22/22 0742  BP: (!) 154/83  Pulse: (!) 52  Resp: 18  Temp: 36.4 C  SpO2: 98%    Last Pain:  Vitals:   07/22/22 0742  PainSc: 0-No pain                 Yevette Edwards

## 2022-07-22 NOTE — Anesthesia Procedure Notes (Signed)
Procedure Name: Intubation Date/Time: 07/22/2022 9:35 AM  Performed by: Lily Lovings, CRNAPre-anesthesia Checklist: Patient identified, Patient being monitored, Timeout performed, Emergency Drugs available and Suction available Patient Re-evaluated:Patient Re-evaluated prior to induction Oxygen Delivery Method: Circle system utilized Preoxygenation: Pre-oxygenation with 100% oxygen Induction Type: IV induction Ventilation: Mask ventilation without difficulty Laryngoscope Size: McGraph and 4 Grade View: Grade II Tube type: Oral Tube size: 7.5 mm Number of attempts: 1 Airway Equipment and Method: Stylet Placement Confirmation: ETT inserted through vocal cords under direct vision, positive ETCO2 and breath sounds checked- equal and bilateral Secured at: 23 cm Tube secured with: Tape Dental Injury: Teeth and Oropharynx as per pre-operative assessment

## 2022-07-22 NOTE — H&P (Signed)
[facility] Admission History & Physical  MRN : [id number]  [name]. is a 52 y.o. (Oct 22, 1970) male who presents with chief complaint of No chief complaint on file. Marland Kitchen History of Present Illness: [name] is a 53 year old male who presents today due to pain in his right fistula area.  The patient previously had a fistula in the upper extremity that was embolized due to steal syndrome.  Recently he has begun to have pain in that area of his fistula.  He notes it is very painful and he would like it removed at this time.  Has any problems with his left upper extremity fistula.     Right arm previously had coils placed and today and notes that the close remain in place with some clotting around the area.  There still remains significant residual flow.  The patient notes significant pain in this area.  Current Facility-Administered Medications  Medication Dose Route Frequency Provider Last Rate Last Admin   0.9 %  sodium chloride infusion   Intravenous Continuous Piscitello, Cleda Mccreedy, MD       ceFAZolin (ANCEF) IVPB 2g/100 mL premix  2 g Intravenous On Call to OR Georgiana Spinner, NP       chlorhexidine (PERIDEX) 0.12 % solution 15 mL  15 mL Mouth/Throat Once Piscitello, Cleda Mccreedy, MD       Or   Oral care mouth rinse  15 mL Mouth Rinse Once Piscitello, Cleda Mccreedy, MD       Chlorhexidine Gluconate Cloth 2 % PADS 6 each  6 each Topical Once Georgiana Spinner, NP       And   Chlorhexidine Gluconate Cloth 2 % PADS 6 each  6 each Topical Once Georgiana Spinner, NP        Past Medical History:  Diagnosis Date   Acute bilateral cerebral infarction in a watershed distribution [facility]) 04/21/2021   a.) MRI brain 04/21/2021 --> patchy ACUTE cortically-based infarcts within the bilateral high frontal lobes and right parietal lobe   Acute cerebral infarction (HCC) 02/13/2021   a.) MRI brain 02/13/2021: acute LEFT hippocampal infarct   Acute cerebral infarction (HCC)  04/21/2021   a.) MRI brain 04/21/2021: subcentimeter ACUTE infarct within the callosal splenium on the LEFT   Anemia of chronic renal failure    Anxiety    Aortic dilatation (HCC) 11/25/2020   a.) TTE 11/25/2020: Ao root measured 38 mm. b.) TTE 04/22/2021: Ao root measured 44 mm; c. 11/2021 Echo: Ao root 40mm.   Atrial fibrillation (HCC)    a.) CHA2DS2-VASc = 6 (CHF, HTN, CVA x2, prior MI, T2DM). b.) rate/rhythm maintained on oral amiodarone + carvedilol; chronic OAC/AP therapy discontinued following ICH   BPH (benign prostatic hyperplasia)    Cardiac arrest (HCC) 07/26/2021   a.) in setting of hyperkalemia, NSTEMI, and seizure following missed HD; pulseless and apneic --> required 1 round of CPR prior to ROSC   Cellulitis    Cerebral hemorrhage (HCC) 11/21/2021   a.) CT head 11/21/2021 --> 1.4 x 1.5 x 3.1 cm ACUTE hemorrhage within the inferior aspect of the fourth ventricle, and extending inferiorly through the foramen of foramen of [place] --> occurred in setting of HTN emergency and DOAC/APT-->eliquis/plavix d/c'd.   Cerebral microvascular disease    Chronic diarrhea    Chronic heart failure with preserved ejection fraction (HFpEF) (HCC) 07/19/2017   a.) 06/2017 Echo: EF 75%; b.) 11/2020 Echo: EF 55-60%; c.) 04/2021 Echo: EF 55-60%; d.) 11/2021 Echo:  EF 65-70%, GrII DD, nl RV fxn, sev dil LA, large circumferential pericardial eff w/o tamponade, triv MR, AoV sclerosis. Ao root 40mm; e.) TTE 02/28/2025: EF 60-65%, sev LVH, mod posterior LV effusion, AoV sclerosis   Chronic pain of both ankles    Coronary artery disease 02/26/2020   a. 02/2020 Ant STEMI/PCI: LM nl, LAD 63m (3.5x30 Resolute Onyx), RI nl, LCX min irregs, RCA min irregs. EF 65%.   DDD (degenerative disc disease), cervical    Diabetes mellitus, type II (HCC)    Diabetic neuropathy (HCC)    Esophagitis    ESRD (end stage renal disease) on dialysis (HCC)    a.) [facility]; T-Th-Sat   Frequent falls    Gait instability    Gastritis     Gastroparesis    GERD (gastroesophageal reflux disease)    H/O enucleation of left eyeball    Heart palpitations    History of kidney stones    HLD (hyperlipidemia)    Hypertension    Insomnia    a.) uses melatonin PRN   Long term current use of amiodarone    Nausea and vomiting in adult    recurrent   NSTEMI (non-ST elevated myocardial infarction) (HCC) 07/26/2021   a.) hyperkalemic from missed HD; seizures; decompensated to cardiac arrest and required CPR x 1 round prior to ROSC.   Osteomyelitis (HCC)    Pericardial effusion    a. 11/2021 Echo: EF 65-70%, GrII DD, nl RV fxn, sev dil LA, large circumferential pericardial eff w/o tamponade, triv MR, AoV sclerosis. Ao root 40mm.   Seizure (HCC)    a.) last 07/26/2021 in setting of missed HD --> hyperkalemic at 6.5 --> pulseless/apneic and required CPR; discharged home on levetiracetam.   ST elevation myocardial infarction (STEMI) of anterior wall (HCC) 02/26/2020   a.) LHC/PCI 02/26/2020 --> EF 65%; LVEDP 11 mmHg; 90% mLAD (3.5 x 20 mm Resolute Onyx DES x 1)    Past Surgical History:  Procedure Laterality Date   A/V FISTULAGRAM Left 08/31/2021   Procedure: A/V Fistulagram;  Surgeon: Annice Needy, MD;  Location: [name] INVASIVE [facility];  Service: Cardiovascular;  Laterality: Left;   A/V FISTULAGRAM Left 01/25/2022   Procedure: A/V Fistulagram;  Surgeon: Annice Needy, MD;  Location: [name];  Service: Cardiovascular;  Laterality: Left;   AMPUTATION Left 03/16/2016   Procedure: AMPUTATION DIGIT LEFT HALLUX;  Surgeon: Vivi Barrack, DPM;  Location: [facility] OR;  Service: Podiatry;  Laterality: Left;  can start around 5    AMPUTATION Right 02/09/2021   Procedure: AMPUTATION DIGIT;  Surgeon: Marlyne Beards, MD;  Location: [facility] OR;  Service: Orthopedics;  Laterality: Right;   AMPUTATION TOE Right 02/28/2022   Procedure: AMPUTATION TOE;  Surgeon: Louann Sjogren, DPM;  Location: [name] ORS;  Service: Podiatry;  Laterality: Right;   right fifth toe   ARTHROSCOPIC REPAIR ACL Left    AV FISTULA PLACEMENT Right 05/31/2019   Procedure: Brachiocephalic AV fistula creation;  Surgeon: Annice Needy, MD;  Location: [name] ORS;  Service: Vascular;  Laterality: Right;   AV FISTULA PLACEMENT Left 08/13/2021   Procedure: ARTERIOVENOUS (AV) FISTULA CREATION (BRACHIALCEPHALIC);  Surgeon: Annice Needy, MD;  Location: [name] ORS;  Service: Vascular;  Laterality: Left;   COLONOSCOPY WITH PROPOFOL N/A 10/28/2015   Procedure: COLONOSCOPY WITH PROPOFOL;  Surgeon: Christena Deem, MD;  Location: [facility] ENDOSCOPY;  Service: Endoscopy;  Laterality: N/A;   COLONOSCOPY WITH PROPOFOL N/A 10/29/2015   Procedure: COLONOSCOPY WITH PROPOFOL;  Surgeon:  Christena Deem, MD;  Location: [facility] ENDOSCOPY;  Service: Endoscopy;  Laterality: N/A;   CORONARY ULTRASOUND/IVUS N/A 02/26/2020   Procedure: Intravascular Ultrasound/IVUS;  Surgeon: Corky Crafts, MD;  Location: [facility] INVASIVE CV LAB;  Service: Cardiovascular;  Laterality: N/A;   CORONARY/GRAFT ACUTE MI REVASCULARIZATION N/A 02/26/2020   Procedure: Coronary/Graft Acute MI Revascularization;  Surgeon: Corky Crafts, MD;  Location: [facility] INVASIVE CV LAB;  Service: Cardiovascular;  Laterality: N/A;   DIALYSIS/PERMA CATHETER INSERTION Right 04/26/2019   Perm Cath    DIALYSIS/PERMA CATHETER INSERTION N/A 04/26/2019   Procedure: DIALYSIS/PERMA CATHETER INSERTION;  Surgeon: Annice Needy, MD;  Location: [name];  Service: Cardiovascular;  Laterality: N/A;   DIALYSIS/PERMA CATHETER INSERTION N/A 05/25/2021   Procedure: DIALYSIS/PERMA CATHETER INSERTION;  Surgeon: Annice Needy, MD;  Location: [name];  Service: Cardiovascular;  Laterality: N/A;   DIALYSIS/PERMA CATHETER INSERTION N/A 08/31/2021   Procedure: DIALYSIS/PERMA CATHETER INSERTION;  Surgeon: Annice Needy, MD;  Location: [name];  Service: Cardiovascular;  Laterality: N/A;   DIALYSIS/PERMA CATHETER REMOVAL N/A  08/15/2019   Procedure: DIALYSIS/PERMA CATHETER REMOVAL;  Surgeon: Renford Dills, MD;  Location: [name];  Service: Cardiovascular;  Laterality: N/A;   DIALYSIS/PERMA CATHETER REMOVAL N/A 03/15/2022   Procedure: DIALYSIS/PERMA CATHETER REMOVAL;  Surgeon: Annice Needy, MD;  Location: [name];  Service: Cardiovascular;  Laterality: N/A;   EMBOLIZATION Right 02/06/2021   Procedure: EMBOLIZATION;  Surgeon: Annice Needy, MD;  Location: [name];  Service: Cardiovascular;  Laterality: Right;  Right Upper Extremity Dialysis Access, Permcath Placement   ESOPHAGOGASTRODUODENOSCOPY (EGD) WITH PROPOFOL N/A 12/27/2017   Procedure: ESOPHAGOGASTRODUODENOSCOPY (EGD) WITH PROPOFOL;  Surgeon: Toledo, Boykin Nearing, MD;  Location: [name] ENDOSCOPY;  Service: Gastroenterology;  Laterality: N/A;   EYE SURGERY     LEFT HEART CATH AND CORONARY ANGIOGRAPHY N/A 02/26/2020   Procedure: LEFT HEART CATH AND CORONARY ANGIOGRAPHY;  Surgeon: Corky Crafts, MD;  Location: [facility] INVASIVE CV LAB;  Service: Cardiovascular;  Laterality: N/A;   PROSTATE SURGERY  2016   TONSILECTOMY/ADENOIDECTOMY WITH MYRINGOTOMY     TONSILLECTOMY     UPPER EXTREMITY ANGIOGRAPHY Right 11/26/2020   Procedure: Upper Extremity Angiography;  Surgeon: Annice Needy, MD;  Location: [name];  Service: Cardiovascular;  Laterality: Right;   UPPER EXTREMITY ANGIOGRAPHY Right 02/05/2021   Procedure: UPPER EXTREMITY ANGIOGRAPHY;  Surgeon: Annice Needy, MD;  Location: [name];  Service: Cardiovascular;  Laterality: Right;     Social History   Tobacco Use   Smoking status: Never   Smokeless tobacco: Never  Vaping Use   Vaping Use: Never used  Substance Use Topics   Alcohol use: No   Drug use: No     Family History  Problem Relation Age of Onset   CAD Father    Stroke Father    Diabetes Mellitus II Mother    Kidney failure Mother    Schizophrenia Mother     Allergies  Allergen  Reactions   Promethazine Diarrhea and Other (See Comments)    Muscle cramps   Promethazine Hcl Other (See Comments)    Cramping all over  Other reaction(s): cramp all over   Other     Other reaction(s): Unknown Other reaction(s): Unknown     REVIEW OF SYSTEMS (Negative unless checked)  Constitutional: [] Weight loss  [] Fever  [] Chills Cardiac: [] Chest pain   [] Chest pressure   [x] Palpitations   [] Shortness  of breath when laying flat   [] Shortness of breath at rest   [] Shortness of breath with exertion. Vascular:  [] Pain in legs with walking   [] Pain in legs at rest   [] Pain in legs when laying flat   [] Claudication   [] Pain in feet when walking  [] Pain in feet at rest  [] Pain in feet when laying flat   [] History of DVT   [] Phlebitis   [] Swelling in legs   [] Varicose veins   [] Non-healing ulcers Pulmonary:   [] Uses home oxygen   [] Productive cough   [] Hemoptysis   [] Wheeze  [] COPD   [] Asthma Neurologic:  [] Dizziness  [] Blackouts   [] Seizures   [] History of stroke   [] History of TIA  [] Aphasia   [] Temporary blindness   [] Dysphagia   [] Weakness or numbness in arms   [] Weakness or numbness in legs Musculoskeletal:  [] Arthritis   [] Joint swelling   [x] Joint pain   [] Low back pain Hematologic:  [] Easy bruising  [] Easy bleeding   [] Hypercoagulable state   [x] Anemic  [] Hepatitis Gastrointestinal:  [] Blood in stool   [] Vomiting blood  [x] Gastroesophageal reflux/heartburn   [] Difficulty swallowing. Genitourinary:  [x] Chronic kidney disease   [] Difficult urination  [] Frequent urination  [] Burning with urination   [] Blood in urine Skin:  [] Rashes   [] Ulcers   [] Wounds Psychological:  [x] History of anxiety   []  History of major depression.  Physical Examination  There were no vitals filed for this visit. There is no height or weight on file to calculate BMI. Gen: WD/WN, NAD Head: [place] Level/AT, No temporalis wasting.  Ear/Nose/Throat: Hearing grossly intact, nares w/o erythema or drainage, oropharynx w/o  Erythema/Exudate,  Eyes: Conjunctiva clear, sclera non-icteric Neck: Trachea midline.  No JVD.  Pulmonary:  Good air movement, respirations not labored, no use of accessory muscles.  Cardiac: RRR, normal S1, S2. Vascular: AVF with flow present on each side. Vessel Right Left  Radial Palpable Palpable                   Musculoskeletal: M/S 5/5 throughout.  Extremities without ischemic changes.  No deformity or atrophy.  Neurologic: Sensation grossly intact in extremities.  Symmetrical.  Speech is fluent. Motor exam as listed above. Psychiatric: Judgment intact, Mood & affect appropriate for pt's clinical situation. Dermatologic: No rashes or ulcers noted.  No cellulitis or open wounds.      CBC Lab Results  Component Value Date   WBC 6.4 06/08/2022   HGB 11.7 (L) 06/08/2022   HCT 37.4 (L) 06/08/2022   MCV 70.8 (L) 06/08/2022   PLT 105 (L) 06/08/2022    BMET    Component Value Date/Time   NA 139 06/08/2022 1129   K 3.8 06/08/2022 1129   CL 111 06/08/2022 1129   CO2 14 (L) 06/08/2022 1129   GLUCOSE 51 (L) 06/08/2022 1129   BUN 78 (H) 06/08/2022 1129   CREATININE 12.98 (H) 06/08/2022 1129   CALCIUM 8.5 (L) 06/08/2022 1129   GFRNONAA 4 (L) 06/08/2022 1129   GFRAA 5 (L) 08/27/2019 1540   CrCl cannot be calculated (Patient's most recent lab result is older than the maximum 21 days allowed.).  COAG Lab Results  Component Value Date   INR 1.1 03/01/2022   INR 1.6 (H) 11/21/2021   INR 1.3 (H) 08/12/2021    Radiology No results found.   Assessment/Plan 1. 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  This is causing the patient some pain and  discomfort.  Per his request we will ligate and remove the aneurysmal fistula.  We discussed the risk benefits and alternatives patient agrees to proceed.   2. Essential hypertension, benign Continue antihypertensive medications as already ordered, these medications have been  reviewed and there are no changes at this time.   3. Uncontrolled type 2 diabetes mellitus with hyperglycemia, with long-term current use of insulin (HCC) Continue hypoglycemic medications as already ordered, these medications have been reviewed and there are no changes at this time.   Festus Barren, MD  07/22/2022 7:29 AM

## 2022-07-27 LAB — SURGICAL PATHOLOGY

## 2022-07-28 ENCOUNTER — Ambulatory Visit (INDEPENDENT_AMBULATORY_CARE_PROVIDER_SITE_OTHER): Payer: Medicare HMO | Admitting: Nurse Practitioner

## 2022-07-28 ENCOUNTER — Telehealth (INDEPENDENT_AMBULATORY_CARE_PROVIDER_SITE_OTHER): Payer: Self-pay

## 2022-07-28 ENCOUNTER — Encounter (INDEPENDENT_AMBULATORY_CARE_PROVIDER_SITE_OTHER): Payer: Self-pay | Admitting: Nurse Practitioner

## 2022-07-28 VITALS — BP 134/72 | HR 60 | Resp 16

## 2022-07-28 DIAGNOSIS — N186 End stage renal disease: Secondary | ICD-10-CM

## 2022-07-28 NOTE — Telephone Encounter (Signed)
Patient called requesting an appt, stating that his wound is "leaking" He's wasn't able to give me a description, but he said it wasn't blood. Patient had a right arm fistula removed on 07/22/22.  Patient has been scheduled to come in at 2:30

## 2022-07-29 ENCOUNTER — Other Ambulatory Visit (INDEPENDENT_AMBULATORY_CARE_PROVIDER_SITE_OTHER): Payer: Self-pay | Admitting: Nurse Practitioner

## 2022-07-29 ENCOUNTER — Encounter (INDEPENDENT_AMBULATORY_CARE_PROVIDER_SITE_OTHER): Payer: Medicare HMO

## 2022-07-29 ENCOUNTER — Ambulatory Visit (INDEPENDENT_AMBULATORY_CARE_PROVIDER_SITE_OTHER): Payer: Medicare HMO

## 2022-07-29 ENCOUNTER — Telehealth (INDEPENDENT_AMBULATORY_CARE_PROVIDER_SITE_OTHER): Payer: Self-pay

## 2022-07-29 ENCOUNTER — Encounter (INDEPENDENT_AMBULATORY_CARE_PROVIDER_SITE_OTHER): Payer: Self-pay | Admitting: Nurse Practitioner

## 2022-07-29 DIAGNOSIS — N186 End stage renal disease: Secondary | ICD-10-CM

## 2022-07-29 NOTE — Telephone Encounter (Signed)
You can call the patient and we can re wrap his arm as well

## 2022-07-29 NOTE — Progress Notes (Addendum)
Subjective:    Patient ID: [name]., male    DOB: 11/12/1970, 52 y.o.   MRN: [id number] Chief Complaint  Patient presents with   Follow-up    Wound check    The patient presents today due to leaking from his right upper extremity following resection of the graft on 07/22/2022.  He notes that he began to have significant amounts of fluid coming along.  The wound itself is there is some bleeding over the area.  It is noted that he has some serous drainage from his arm leaking from the incision.  He is currently following a diabetic diet     Review of Systems  Skin:  Positive for wound.  All other systems reviewed and are negative.      Objective:   Physical Exam Vitals reviewed.  HENT:     Head: Normocephalic.  Cardiovascular:     Rate and Rhythm: Normal rate.  Pulmonary:     Effort: Pulmonary effort is normal.  Skin:    General: Skin is warm and dry.  Neurological:     Mental Status: He is alert and oriented to person, place, and time.     Motor: Weakness present.  Psychiatric:        Mood and Affect: Mood normal.        Behavior: Behavior normal.        Thought Content: Thought content normal.        Judgment: Judgment normal.     BP 134/72 (BP Location: Right Arm)   Pulse 60   Resp 16   Past Medical History:  Diagnosis Date   Acute bilateral cerebral infarction in a watershed distribution [facility]) 04/21/2021   a.) MRI brain 04/21/2021 --> patchy ACUTE cortically-based infarcts within the bilateral high frontal lobes and right parietal lobe   Acute cerebral infarction (HCC) 02/13/2021   a.) MRI brain 02/13/2021: acute LEFT hippocampal infarct   Acute cerebral infarction (HCC) 04/21/2021   a.) MRI brain 04/21/2021: subcentimeter ACUTE infarct within the callosal splenium on the LEFT   Anemia of chronic renal failure    Anxiety    Aortic dilatation (HCC) 11/25/2020   a.) TTE 11/25/2020: Ao root measured 38 mm. b.) TTE 04/22/2021: Ao root measured 44 mm;  c. 11/2021 Echo: Ao root 40mm.   Atrial fibrillation (HCC)    a.) CHA2DS2-VASc = 6 (CHF, HTN, CVA x2, prior MI, T2DM). b.) rate/rhythm maintained on oral amiodarone + carvedilol; chronic OAC/AP therapy discontinued following ICH   BPH (benign prostatic hyperplasia)    Cardiac arrest (HCC) 07/26/2021   a.) in setting of hyperkalemia, NSTEMI, and seizure following missed HD; pulseless and apneic --> required 1 round of CPR prior to ROSC   Cellulitis    Cerebral hemorrhage (HCC) 11/21/2021   a.) CT head 11/21/2021 --> 1.4 x 1.5 x 3.1 cm ACUTE hemorrhage within the inferior aspect of the fourth ventricle, and extending inferiorly through the foramen of foramen of [place] --> occurred in setting of HTN emergency and DOAC/APT-->eliquis/plavix d/c'd.   Cerebral microvascular disease    Chronic diarrhea    Chronic heart failure with preserved ejection fraction (HFpEF) (HCC) 07/19/2017   a.) 06/2017 Echo: EF 75%; b.) 11/2020 Echo: EF 55-60%; c.) 04/2021 Echo: EF 55-60%; d.) 11/2021 Echo: EF 65-70%, GrII DD, nl RV fxn, sev dil LA, large circumferential pericardial eff w/o tamponade, triv MR, AoV sclerosis. Ao root 40mm; e.) TTE 02/28/2025: EF 60-65%, sev LVH, mod posterior LV effusion, AoV sclerosis  Chronic pain of both ankles    Coronary artery disease 02/26/2020   a. 02/2020 Ant STEMI/PCI: LM nl, LAD 64m (3.5x30 Resolute Onyx), RI nl, LCX min irregs, RCA min irregs. EF 65%.   DDD (degenerative disc disease), cervical    Diabetes mellitus, type II (HCC)    Diabetic neuropathy (HCC)    Esophagitis    ESRD (end stage renal disease) on dialysis (HCC)    a.) [facility]; T-Th-Sat   Frequent falls    Gait instability    Gastritis    Gastroparesis    GERD (gastroesophageal reflux disease)    H/O enucleation of left eyeball    Heart palpitations    History of kidney stones    HLD (hyperlipidemia)    Hypertension    Insomnia    a.) uses melatonin PRN   Long term current use of amiodarone    Nausea and  vomiting in adult    recurrent   NSTEMI (non-ST elevated myocardial infarction) (HCC) 07/26/2021   a.) hyperkalemic from missed HD; seizures; decompensated to cardiac arrest and required CPR x 1 round prior to ROSC.   Osteomyelitis (HCC)    Pericardial effusion    a. 11/2021 Echo: EF 65-70%, GrII DD, nl RV fxn, sev dil LA, large circumferential pericardial eff w/o tamponade, triv MR, AoV sclerosis. Ao root 40mm.   Seizure (HCC)    a.) last 07/26/2021 in setting of missed HD --> hyperkalemic at 6.5 --> pulseless/apneic and required CPR; discharged home on levetiracetam.   ST elevation myocardial infarction (STEMI) of anterior wall (HCC) 02/26/2020   a.) LHC/PCI 02/26/2020 --> EF 65%; LVEDP 11 mmHg; 90% mLAD (3.5 x 20 mm Resolute Onyx DES x 1)    Social History   Socioeconomic History   Marital status: Married    Spouse name: Martie Lee    Number of children: 2   Years of education: Not on file   Highest education level: High school graduate  Occupational History   Occupation: Disability    Comment: not employed  Tobacco Use   Smoking status: Never   Smokeless tobacco: Never  Vaping Use   Vaping Use: Never used  Substance and Sexual Activity   Alcohol use: No   Drug use: No   Sexual activity: Yes  Other Topics Concern   Not on file  Social History Narrative   Oldest son killed in car crash June 2020.  Lives in [place] with his wife.   Social Determinants of Health   Financial Resource Strain: High Risk (07/05/2017)   Overall Financial Resource Strain (CARDIA)    Difficulty of Paying Living Expenses: Hard  Food Insecurity: No Food Insecurity (02/28/2022)   Hunger Vital Sign    Worried About Running Out of Food in the Last Year: Never true    Ran Out of Food in the Last Year: Never true  Transportation Needs: No Transportation Needs (02/28/2022)   PRAPARE - Administrator, Civil Service (Medical): No    Lack of Transportation (Non-Medical): No  Physical  Activity: Inactive (07/05/2017)   Exercise Vital Sign    Days of Exercise per Week: 0 days    Minutes of Exercise per Session: 0 min  Stress: Stress Concern Present (07/05/2017)   [employer] of Occupational Health - Occupational Stress Questionnaire    Feeling of Stress : Rather much  Social Connections: Moderately Integrated (07/05/2017)   Social Connection and Isolation Panel [NHANES]    Frequency of Communication with Friends and Family:  Three times a week    Frequency of Social Gatherings with Friends and Family: Once a week    Attends Religious Services: More than 4 times per year    Active Member of [employer] or Organizations: No    Attends Banker Meetings: Never    Marital Status: Married  Catering manager Violence: Not At Risk (02/28/2022)   Humiliation, Afraid, Rape, and Kick questionnaire    Fear of Current or Ex-Partner: No    Emotionally Abused: No    Physically Abused: No    Sexually Abused: No    Past Surgical History:  Procedure Laterality Date   A/V FISTULAGRAM Left 08/31/2021   Procedure: A/V Fistulagram;  Surgeon: Annice Needy, MD;  Location: [name] INVASIVE CV LAB;  Service: Cardiovascular;  Laterality: Left;   A/V FISTULAGRAM Left 01/25/2022   Procedure: A/V Fistulagram;  Surgeon: Annice Needy, MD;  Location: [name];  Service: Cardiovascular;  Laterality: Left;   AMPUTATION Left 03/16/2016   Procedure: AMPUTATION DIGIT LEFT HALLUX;  Surgeon: Vivi Barrack, DPM;  Location: [facility] OR;  Service: Podiatry;  Laterality: Left;  can start around 5    AMPUTATION Right 02/09/2021   Procedure: AMPUTATION DIGIT;  Surgeon: Marlyne Beards, MD;  Location: [facility] OR;  Service: Orthopedics;  Laterality: Right;   AMPUTATION TOE Right 02/28/2022   Procedure: AMPUTATION TOE;  Surgeon: Louann Sjogren, DPM;  Location: [name] ORS;  Service: Podiatry;  Laterality: Right;  right fifth toe   ARTHROSCOPIC REPAIR ACL Left    AV FISTULA PLACEMENT Right 05/31/2019    Procedure: Brachiocephalic AV fistula creation;  Surgeon: Annice Needy, MD;  Location: [name] ORS;  Service: Vascular;  Laterality: Right;   AV FISTULA PLACEMENT Left 08/13/2021   Procedure: ARTERIOVENOUS (AV) FISTULA CREATION (BRACHIALCEPHALIC);  Surgeon: Annice Needy, MD;  Location: [name] ORS;  Service: Vascular;  Laterality: Left;   COLONOSCOPY WITH PROPOFOL N/A 10/28/2015   Procedure: COLONOSCOPY WITH PROPOFOL;  Surgeon: Christena Deem, MD;  Location: [facility] ENDOSCOPY;  Service: Endoscopy;  Laterality: N/A;   COLONOSCOPY WITH PROPOFOL N/A 10/29/2015   Procedure: COLONOSCOPY WITH PROPOFOL;  Surgeon: Christena Deem, MD;  Location: [facility] ENDOSCOPY;  Service: Endoscopy;  Laterality: N/A;   CORONARY ULTRASOUND/IVUS N/A 02/26/2020   Procedure: Intravascular Ultrasound/IVUS;  Surgeon: Corky Crafts, MD;  Location: [facility] INVASIVE CV LAB;  Service: Cardiovascular;  Laterality: N/A;   CORONARY/GRAFT ACUTE MI REVASCULARIZATION N/A 02/26/2020   Procedure: Coronary/Graft Acute MI Revascularization;  Surgeon: Corky Crafts, MD;  Location: [facility] INVASIVE CV LAB;  Service: Cardiovascular;  Laterality: N/A;   DIALYSIS/PERMA CATHETER INSERTION Right 04/26/2019   Perm Cath    DIALYSIS/PERMA CATHETER INSERTION N/A 04/26/2019   Procedure: DIALYSIS/PERMA CATHETER INSERTION;  Surgeon: Annice Needy, MD;  Location: [name];  Service: Cardiovascular;  Laterality: N/A;   DIALYSIS/PERMA CATHETER INSERTION N/A 05/25/2021   Procedure: DIALYSIS/PERMA CATHETER INSERTION;  Surgeon: Annice Needy, MD;  Location: [name];  Service: Cardiovascular;  Laterality: N/A;   DIALYSIS/PERMA CATHETER INSERTION N/A 08/31/2021   Procedure: DIALYSIS/PERMA CATHETER INSERTION;  Surgeon: Annice Needy, MD;  Location: [name];  Service: Cardiovascular;  Laterality: N/A;   DIALYSIS/PERMA CATHETER REMOVAL N/A 08/15/2019   Procedure: DIALYSIS/PERMA CATHETER REMOVAL;  Surgeon: Renford Dills, MD;   Location: [name];  Service: Cardiovascular;  Laterality: N/A;   DIALYSIS/PERMA CATHETER REMOVAL N/A 03/15/2022   Procedure: DIALYSIS/PERMA CATHETER REMOVAL;  Surgeon: Annice Needy, MD;  Location: [name];  Service: Cardiovascular;  Laterality: N/A;   EMBOLIZATION Right 02/06/2021   Procedure: EMBOLIZATION;  Surgeon: Annice Needy, MD;  Location: [name];  Service: Cardiovascular;  Laterality: Right;  Right Upper Extremity Dialysis Access, Permcath Placement   ESOPHAGOGASTRODUODENOSCOPY (EGD) WITH PROPOFOL N/A 12/27/2017   Procedure: ESOPHAGOGASTRODUODENOSCOPY (EGD) WITH PROPOFOL;  Surgeon: Toledo, Boykin Nearing, MD;  Location: [name] ENDOSCOPY;  Service: Gastroenterology;  Laterality: N/A;   EYE SURGERY     LEFT HEART CATH AND CORONARY ANGIOGRAPHY N/A 02/26/2020   Procedure: LEFT HEART CATH AND CORONARY ANGIOGRAPHY;  Surgeon: Corky Crafts, MD;  Location: [facility] INVASIVE CV LAB;  Service: Cardiovascular;  Laterality: N/A;   PROSTATE SURGERY  2016   REVISON OF ARTERIOVENOUS FISTULA Right 07/22/2022   Procedure: RESECTION OF ANEURYSMAL RIGHT ARM ARTERIOVENOUS FISTULA;  Surgeon: Annice Needy, MD;  Location: [name] ORS;  Service: Vascular;  Laterality: Right;   TONSILECTOMY/ADENOIDECTOMY WITH MYRINGOTOMY     TONSILLECTOMY     UPPER EXTREMITY ANGIOGRAPHY Right 11/26/2020   Procedure: Upper Extremity Angiography;  Surgeon: Annice Needy, MD;  Location: [name];  Service: Cardiovascular;  Laterality: Right;   UPPER EXTREMITY ANGIOGRAPHY Right 02/05/2021   Procedure: UPPER EXTREMITY ANGIOGRAPHY;  Surgeon: Annice Needy, MD;  Location: [name] INV[facility];  Service: Cardiovascular;  Laterality: Right;    Family History  Problem Relation Age of Onset   CAD Father    Stroke Father    Diabetes Mellitus II Mother    Kidney failure Mother    Schizophrenia Mother     Allergies  Allergen Reactions   Promethazine Diarrhea and Other (See Comments)    Muscle  cramps   Promethazine Hcl Other (See Comments)    Cramping all over  Other reaction(s): cramp all over   Other     Other reaction(s): Unknown Other reaction(s): Unknown       Latest Ref Rng & Units 07/22/2022    7:34 AM 07/22/2022    7:33 AM 06/08/2022   11:29 AM  CBC  WBC 4.0 - 10.5 K/uL  6.4  6.4   Hemoglobin 13.0 - 17.0 g/dL 16.1  09.6  04.5   Hematocrit 39.0 - 52.0 % 37.0  40.6  37.4   Platelets 150 - 400 K/uL  94  105       CMP     Component Value Date/Time   NA 141 07/22/2022 0734   K 3.8 07/22/2022 0734   CL 111 07/22/2022 0734   CO2 19 (L) 07/22/2022 0733   GLUCOSE 80 07/22/2022 0734   BUN 50 (H) 07/22/2022 0734   CREATININE 13.60 (H) 07/22/2022 0734   CALCIUM 8.5 (L) 07/22/2022 0733   PROT 6.9 06/08/2022 1129   ALBUMIN 3.7 06/08/2022 1129   AST 9 (L) 06/08/2022 1129   ALT 12 06/08/2022 1129   ALKPHOS 82 06/08/2022 1129   BILITOT 0.9 06/08/2022 1129   GFRNONAA 4 (L) 07/22/2022 0733     No results found.     Assessment & Plan:   1. ESRD (end stage renal disease) (HCC) The patient has a large seroma.  We will culture the area to ensure there is no underlying infection but he does have a large seroma underneath.  It measures approximately 8.5 x 4 x 3 cm.  There is no internal flow noted.  This was done by bedside ultrasound.  Based on this we will try to obtain a wound vacuum for the patient  in order to help facilitate drainage from the area.   Current Outpatient Medications on File Prior to Visit  Medication Sig Dispense Refill   acetaminophen (TYLENOL) 325 MG tablet Take 1-2 tablets (325-650 mg total) by mouth every 4 (four) hours as needed for mild pain.     amiodarone (PACERONE) 200 MG tablet Take 1 tablet (200 mg total) by mouth every morning. 90 tablet 3   amLODipine (NORVASC) 10 MG tablet Take 1 tablet (10 mg total) by mouth daily. (Patient taking differently: Take 5 mg by mouth daily.) 30 tablet 2   ammonium lactate (AMLACTIN) 12 % cream Apply 1  Application topically as needed for dry skin. 385 g 0   atorvastatin (LIPITOR) 80 MG tablet TAKE ONE TABLET BY MOUTH AT BEDTIME. 90 tablet 3   calcium acetate (PHOSLO) 667 MG capsule Take 2 capsules (1,334 mg total) by mouth 3 (three) times daily with meals. 180 capsule 0   carvedilol (COREG) 25 MG tablet Take 25 mg by mouth 2 (two) times daily.     cloNIDine (CATAPRES) 0.3 MG tablet Take 1 tablet (0.3 mg total) by mouth every 8 (eight) hours. 90 tablet 2   diphenoxylate-atropine (LOMOTIL) 2.5-0.025 MG tablet Take as needed for diarrhea. Can take up to 4 times daily.     dorzolamide-timolol (COSOPT) 22.3-6.8 MG/ML ophthalmic solution Place 1 drop into the right eye 2 (two) times daily.     hydrALAZINE (APRESOLINE) 25 MG tablet Take 25 mg by mouth 3 (three) times daily.     HYDROcodone-acetaminophen (NORCO/VICODIN) 5-325 MG tablet Take 2 tablets by mouth every 6 (six) hours as needed for moderate pain. 20 tablet 0   levETIRAcetam (KEPPRA) 250 MG tablet Take 1 tablet (250 mg total) by mouth Every Tuesday,Thursday,and Saturday with dialysis. 15 tablet 1   levETIRAcetam (KEPPRA) 500 MG tablet Take 1 tablet (500 mg total) by mouth daily after breakfast. 30 tablet 1   lidocaine-prilocaine (EMLA) cream Apply topically.     silver sulfADIAZINE (SILVADENE) 1 % cream Apply 1 Application topically daily. 50 g 0   levETIRAcetam (KEPPRA) 750 MG tablet Take 1 tablet (750 mg total) by mouth at bedtime. (Patient not taking: Reported on 07/15/2022) 60 tablet 2   nitroGLYCERIN (NITROSTAT) 0.4 MG SL tablet Place 1 tablet (0.4 mg total) under the tongue every 5 (five) minutes as needed for chest pain. 25 tablet 6   No current facility-administered medications on file prior to visit.    There are no Patient Instructions on file for this visit. No follow-ups on file.   Georgiana Spinner, NP

## 2022-07-29 NOTE — Telephone Encounter (Signed)
Pt coming in this afternoon to be wrapped again

## 2022-07-30 ENCOUNTER — Telehealth (INDEPENDENT_AMBULATORY_CARE_PROVIDER_SITE_OTHER): Payer: Self-pay

## 2022-07-30 NOTE — Telephone Encounter (Signed)
I spoke with the patient to have him scheduled to come in to be seen on 07/29/22 based on him telling me that his wrap was soaked through and was sliding up his arm. Patient was scheduled for a 11:00 am appt that was rescheduled to 2:30 pm and then canceled per the patient. I have been attempted to get a wound vac through [facility] and homehealth through [place].

## 2022-08-02 ENCOUNTER — Other Ambulatory Visit: Payer: Self-pay

## 2022-08-02 ENCOUNTER — Ambulatory Visit: Payer: Medicare HMO | Admitting: Podiatry

## 2022-08-02 ENCOUNTER — Ambulatory Visit (INDEPENDENT_AMBULATORY_CARE_PROVIDER_SITE_OTHER): Payer: Medicare HMO | Admitting: Podiatry

## 2022-08-02 ENCOUNTER — Encounter: Payer: Self-pay | Admitting: Podiatry

## 2022-08-02 DIAGNOSIS — M79675 Pain in left toe(s): Secondary | ICD-10-CM | POA: Diagnosis not present

## 2022-08-02 DIAGNOSIS — I739 Peripheral vascular disease, unspecified: Secondary | ICD-10-CM

## 2022-08-02 DIAGNOSIS — L84 Corns and callosities: Secondary | ICD-10-CM

## 2022-08-02 DIAGNOSIS — M79674 Pain in right toe(s): Secondary | ICD-10-CM | POA: Diagnosis not present

## 2022-08-02 DIAGNOSIS — B351 Tinea unguium: Secondary | ICD-10-CM | POA: Diagnosis not present

## 2022-08-02 MED ORDER — AMIODARONE HCL 200 MG PO TABS: 200.0000 mg | ORAL_TABLET | ORAL | 2 refills | Status: DC

## 2022-08-02 NOTE — Progress Notes (Signed)
Subjective:  Patient ID: [name] Barns., male    DOB: August 18, 1970,   MRN: [id number]  Chief Complaint  Patient presents with   Nail Problem     Routine foot care    52 y.o. male presents for concern of thickened elongated and painful nails that are difficult to trim. Requesting to have them trimmed today. Relates burning and tingling in their feet. Patient is diabetic and last A1c was  Lab Results  Component Value Date   HGBA1C 4.5 (L) 11/21/2021   .   PCP:  [facility]    . Denies any other pedal complaints. Denies n/v/f/c.   Past Medical History:  Diagnosis Date   Acute bilateral cerebral infarction in a watershed distribution [facility]) 04/21/2021   a.) MRI brain 04/21/2021 --> patchy ACUTE cortically-based infarcts within the bilateral high frontal lobes and right parietal lobe   Acute cerebral infarction (HCC) 02/13/2021   a.) MRI brain 02/13/2021: acute LEFT hippocampal infarct   Acute cerebral infarction (HCC) 04/21/2021   a.) MRI brain 04/21/2021: subcentimeter ACUTE infarct within the callosal splenium on the LEFT   Anemia of chronic renal failure    Anxiety    Aortic dilatation (HCC) 11/25/2020   a.) TTE 11/25/2020: Ao root measured 38 mm. b.) TTE 04/22/2021: Ao root measured 44 mm; c. 11/2021 Echo: Ao root 40mm.   Atrial fibrillation (HCC)    a.) CHA2DS2-VASc = 6 (CHF, HTN, CVA x2, prior MI, T2DM). b.) rate/rhythm maintained on oral amiodarone + carvedilol; chronic OAC/AP therapy discontinued following ICH   BPH (benign prostatic hyperplasia)    Cardiac arrest (HCC) 07/26/2021   a.) in setting of hyperkalemia, NSTEMI, and seizure following missed HD; pulseless and apneic --> required 1 round of CPR prior to ROSC   Cellulitis    Cerebral hemorrhage (HCC) 11/21/2021   a.) CT head 11/21/2021 --> 1.4 x 1.5 x 3.1 cm ACUTE hemorrhage within the inferior aspect of the fourth ventricle, and extending inferiorly through the foramen of foramen of  [place] --> occurred in setting of HTN emergency and DOAC/APT-->eliquis/plavix d/c'd.   Cerebral microvascular disease    Chronic diarrhea    Chronic heart failure with preserved ejection fraction (HFpEF) (HCC) 07/19/2017   a.) 06/2017 Echo: EF 75%; b.) 11/2020 Echo: EF 55-60%; c.) 04/2021 Echo: EF 55-60%; d.) 11/2021 Echo: EF 65-70%, GrII DD, nl RV fxn, sev dil LA, large circumferential pericardial eff w/o tamponade, triv MR, AoV sclerosis. Ao root 40mm; e.) TTE 02/28/2025: EF 60-65%, sev LVH, mod posterior LV effusion, AoV sclerosis   Chronic pain of both ankles    Coronary artery disease 02/26/2020   a. 02/2020 Ant STEMI/PCI: LM nl, LAD 60m (3.5x30 Resolute Onyx), RI nl, LCX min irregs, RCA min irregs. EF 65%.   DDD (degenerative disc disease), cervical    Diabetes mellitus, type II (HCC)    Diabetic neuropathy (HCC)    Esophagitis    ESRD (end stage renal disease) on dialysis (HCC)    a.) [facility]; T-Th-Sat   Frequent falls    Gait instability    Gastritis    Gastroparesis    GERD (gastroesophageal reflux disease)    H/O enucleation of left eyeball    Heart palpitations    History of kidney stones    HLD (hyperlipidemia)    Hypertension    Insomnia    a.) uses melatonin PRN   Long term current use of amiodarone    Nausea and vomiting in adult  recurrent   NSTEMI (non-ST elevated myocardial infarction) (HCC) 07/26/2021   a.) hyperkalemic from missed HD; seizures; decompensated to cardiac arrest and required CPR x 1 round prior to ROSC.   Osteomyelitis (HCC)    Pericardial effusion    a. 11/2021 Echo: EF 65-70%, GrII DD, nl RV fxn, sev dil LA, large circumferential pericardial eff w/o tamponade, triv MR, AoV sclerosis. Ao root 40mm.   Seizure (HCC)    a.) last 07/26/2021 in setting of missed HD --> hyperkalemic at 6.5 --> pulseless/apneic and required CPR; discharged home on levetiracetam.   ST elevation myocardial infarction (STEMI) of anterior wall (HCC) 02/26/2020   a.)  LHC/PCI 02/26/2020 --> EF 65%; LVEDP 11 mmHg; 90% mLAD (3.5 x 20 mm Resolute Onyx DES x 1)    Objective:  Physical Exam: Vascular: DP/PT pulses 2/4 bilateral. CFT <3 seconds. Absent hair growth on digits. Edema noted to bilateral lower extremities. Xerosis noted bilaterally.  Skin. No lacerations or abrasions bilateral feet. Nails x4 bilateral  are thickened discolored and elongated with subungual debris. Hyperkeratotic area noted to the left heel. No underlying ulceration noted.  Musculoskeletal: MMT 5/5 bilateral lower extremities in DF, PF, Inversion and Eversion. Deceased ROM in DF of ankle joint. 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 Protective sensation diminished bilateral.    Assessment:   1. Dermatophytosis of nail   2. Pain in toes of both feet   3. PAD (peripheral artery disease) (HCC)   4. Callus of heel      Plan:  Patient was evaluated and treated and all questions answered. -Discussed and educated patient on diabetic foot care, especially with  regards to the vascular, neurological and musculoskeletal systems.  -Stressed the importance of good glycemic control and the detriment of not  controlling glucose levels in relation to the foot. -Discussed supportive shoes at all times and checking feet regularly.  -Mechanically debrided all nails x4 bilateral using sterile nail nipper and filed with dremel without incident  -Answered all patient questions -Patient to return  in 3 months for at risk foot care -Patient advised to call the office if any problems or questions arise in the meantime.   Louann Sjogren, DPM

## 2022-08-04 ENCOUNTER — Ambulatory Visit (INDEPENDENT_AMBULATORY_CARE_PROVIDER_SITE_OTHER): Payer: Medicare HMO | Admitting: Nurse Practitioner

## 2022-08-04 ENCOUNTER — Telehealth (INDEPENDENT_AMBULATORY_CARE_PROVIDER_SITE_OTHER): Payer: Self-pay

## 2022-08-04 ENCOUNTER — Encounter (INDEPENDENT_AMBULATORY_CARE_PROVIDER_SITE_OTHER): Payer: Self-pay | Admitting: Nurse Practitioner

## 2022-08-04 VITALS — BP 107/70 | HR 58 | Resp 18 | Ht 75.0 in | Wt 180.0 lb

## 2022-08-04 DIAGNOSIS — N186 End stage renal disease: Secondary | ICD-10-CM

## 2022-08-04 MED ORDER — HYDROCODONE-ACETAMINOPHEN 5-325 MG PO TABS: 2.0000 | ORAL_TABLET | Freq: Four times a day (QID) | ORAL | 0 refills | Status: DC | PRN

## 2022-08-04 NOTE — Telephone Encounter (Signed)
I have attempted to get a wound vac for this patient through [facility] as the patient's insurance and place of residence severely limits the options for homehealth and wound vac. I have contacted all insurances and they either don't take [facility] or his place of residence is out of network. The last and only group that takes his insurance is [facility] and it was faxed in and transferred from [place] to [place] with me being informed. [facility] in [place] does not have the nursing staff that can take the patient and supply the needs of a wound vac. This was per the agent at [facility].

## 2022-08-05 ENCOUNTER — Encounter (INDEPENDENT_AMBULATORY_CARE_PROVIDER_SITE_OTHER): Payer: Self-pay | Admitting: Nurse Practitioner

## 2022-08-05 NOTE — Progress Notes (Signed)
Subjective:    Patient ID: [name] Barns., male    DOB: March 21, 1970, 52 y.o.   MRN: [id number] Chief Complaint  Patient presents with   Follow-up    2 week wound chec    The patient presents today due to leaking from his right upper extremity following resection of the graft on 07/22/2022.  He had significant draining of the upper extremity however the wound itself is closed but a seroma remains.  He notes it is somewhat tender but not unmanageable.  The swelling is largely contained within the upper arm area.    Review of Systems  Cardiovascular:        Arm swelling  Neurological:  Positive for weakness.  All other systems reviewed and are negative.      Objective:   Physical Exam Vitals reviewed.  HENT:     Head: Normocephalic.  Cardiovascular:     Rate and Rhythm: Normal rate.  Pulmonary:     Effort: Pulmonary effort is normal.  Musculoskeletal:     Comments: Large seroma in right upper  Skin:    General: Skin is warm and dry.  Neurological:     Mental Status: He is alert and oriented to person, place, and time.  Psychiatric:        Mood and Affect: Mood normal.        Behavior: Behavior normal.        Thought Content: Thought content normal.        Judgment: Judgment normal.     BP 107/70 (BP Location: Right Wrist)   Pulse (!) 58   Resp 18   Ht 6\' 3"  (1.905 m)   Wt 180 lb (81.6 kg)   BMI 22.50 kg/m   Past Medical History:  Diagnosis Date   Acute bilateral cerebral infarction in a watershed distribution [facility] 2) 04/21/2021   a.) MRI brain 04/21/2021 --> patchy ACUTE cortically-based infarcts within the bilateral high frontal lobes and right parietal lobe   Acute cerebral infarction (HCC) 02/13/2021   a.) MRI brain 02/13/2021: acute LEFT hippocampal infarct   Acute cerebral infarction (HCC) 04/21/2021   a.) MRI brain 04/21/2021: subcentimeter ACUTE infarct within the callosal splenium on the LEFT   Anemia of chronic renal failure    Anxiety    Aortic  dilatation (HCC) 11/25/2020   a.) TTE 11/25/2020: Ao root measured 38 mm. b.) TTE 04/22/2021: Ao root measured 44 mm; c. 11/2021 Echo: Ao root 40mm.   Atrial fibrillation (HCC)    a.) CHA2DS2-VASc = 6 (CHF, HTN, CVA x2, prior MI, T2DM). b.) rate/rhythm maintained on oral amiodarone + carvedilol; chronic OAC/AP therapy discontinued following ICH   BPH (benign prostatic hyperplasia)    Cardiac arrest (HCC) 07/26/2021   a.) in setting of hyperkalemia, NSTEMI, and seizure following missed HD; pulseless and apneic --> required 1 round of CPR prior to ROSC   Cellulitis    Cerebral hemorrhage (HCC) 11/21/2021   a.) CT head 11/21/2021 --> 1.4 x 1.5 x 3.1 cm ACUTE hemorrhage within the inferior aspect of the fourth ventricle, and extending inferiorly through the foramen of foramen of [place] --> occurred in setting of HTN emergency and DOAC/APT-->eliquis/plavix d/c'd.   Cerebral microvascular disease    Chronic diarrhea    Chronic heart failure with preserved ejection fraction (HFpEF) (HCC) 07/19/2017   a.) 06/2017 Echo: EF 75%; b.) 11/2020 Echo: EF 55-60%; c.) 04/2021 Echo: EF 55-60%; d.) 11/2021 Echo: EF 65-70%, GrII DD, nl RV fxn, sev dil LA, large  circumferential pericardial eff w/o tamponade, triv MR, AoV sclerosis. Ao root 40mm; e.) TTE 02/28/2025: EF 60-65%, sev LVH, mod posterior LV effusion, AoV sclerosis   Chronic pain of both ankles    Coronary artery disease 02/26/2020   a. 02/2020 Ant STEMI/PCI: LM nl, LAD 82m (3.5x30 Resolute Onyx), RI nl, LCX min irregs, RCA min irregs. EF 65%.   DDD (degenerative disc disease), cervical    Diabetes mellitus, type II (HCC)    Diabetic neuropathy (HCC)    Esophagitis    ESRD (end stage renal disease) on dialysis (HCC)    a.) [facility]; T-Th-Sat   Frequent falls    Gait instability    Gastritis    Gastroparesis    GERD (gastroesophageal reflux disease)    H/O enucleation of left eyeball    Heart palpitations    History of kidney stones    HLD  (hyperlipidemia)    Hypertension    Insomnia    a.) uses melatonin PRN   Long term current use of amiodarone    Nausea and vomiting in adult    recurrent   NSTEMI (non-ST elevated myocardial infarction) (HCC) 07/26/2021   a.) hyperkalemic from missed HD; seizures; decompensated to cardiac arrest and required CPR x 1 round prior to ROSC.   Osteomyelitis (HCC)    Pericardial effusion    a. 11/2021 Echo: EF 65-70%, GrII DD, nl RV fxn, sev dil LA, large circumferential pericardial eff w/o tamponade, triv MR, AoV sclerosis. Ao root 40mm.   Seizure (HCC)    a.) last 07/26/2021 in setting of missed HD --> hyperkalemic at 6.5 --> pulseless/apneic and required CPR; discharged home on levetiracetam.   ST elevation myocardial infarction (STEMI) of anterior wall (HCC) 02/26/2020   a.) LHC/PCI 02/26/2020 --> EF 65%; LVEDP 11 mmHg; 90% mLAD (3.5 x 20 mm Resolute Onyx DES x 1)    Social History   Socioeconomic History   Marital status: Married    Spouse name: Martie Lee    Number of children: 2   Years of education: Not on file   Highest education level: High school graduate  Occupational History   Occupation: Disability    Comment: not employed  Tobacco Use   Smoking status: Never   Smokeless tobacco: Never  Vaping Use   Vaping Use: Never used  Substance and Sexual Activity   Alcohol use: No   Drug use: No   Sexual activity: Yes  Other Topics Concern   Not on file  Social History Narrative   Oldest son killed in car crash June 2020.  Lives in [place] with his wife.   Social Determinants of Health   Financial Resource Strain: High Risk (07/05/2017)   Overall Financial Resource Strain (CARDIA)    Difficulty of Paying Living Expenses: Hard  Food Insecurity: No Food Insecurity (02/28/2022)   Hunger Vital Sign    Worried About Running Out of Food in the Last Year: Never true    Ran Out of Food in the Last Year: Never true  Transportation Needs: No Transportation Needs (02/28/2022)    PRAPARE - Administrator, Civil Service (Medical): No    Lack of Transportation (Non-Medical): No  Physical Activity: Inactive (07/05/2017)   Exercise Vital Sign    Days of Exercise per Week: 0 days    Minutes of Exercise per Session: 0 min  Stress: Stress Concern Present (07/05/2017)   [employer] of Occupational Health - Occupational Stress Questionnaire    Feeling of Stress :  Rather much  Social Connections: Moderately Integrated (07/05/2017)   Social Connection and Isolation Panel [NHANES]    Frequency of Communication with Friends and Family: Three times a week    Frequency of Social Gatherings with Friends and Family: Once a week    Attends Religious Services: More than 4 times per year    Active Member of [employer] or Organizations: No    Attends Banker Meetings: Never    Marital Status: Married  Catering manager Violence: Not At Risk (02/28/2022)   Humiliation, Afraid, Rape, and Kick questionnaire    Fear of Current or Ex-Partner: No    Emotionally Abused: No    Physically Abused: No    Sexually Abused: No    Past Surgical History:  Procedure Laterality Date   A/V FISTULAGRAM Left 08/31/2021   Procedure: A/V Fistulagram;  Surgeon: Annice Needy, MD;  Location: [name] INVASIVE CV LAB;  Service: Cardiovascular;  Laterality: Left;   A/V FISTULAGRAM Left 01/25/2022   Procedure: A/V Fistulagram;  Surgeon: Annice Needy, MD;  Location: [name];  Service: Cardiovascular;  Laterality: Left;   AMPUTATION Left 03/16/2016   Procedure: AMPUTATION DIGIT LEFT HALLUX;  Surgeon: Vivi Barrack, DPM;  Location: [facility] OR;  Service: Podiatry;  Laterality: Left;  can start around 5    AMPUTATION Right 02/09/2021   Procedure: AMPUTATION DIGIT;  Surgeon: Marlyne Beards, MD;  Location: [facility] OR;  Service: Orthopedics;  Laterality: Right;   AMPUTATION TOE Right 02/28/2022   Procedure: AMPUTATION TOE;  Surgeon: Louann Sjogren, DPM;  Location: [name] ORS;  Service:  Podiatry;  Laterality: Right;  right fifth toe   ARTHROSCOPIC REPAIR ACL Left    AV FISTULA PLACEMENT Right 05/31/2019   Procedure: Brachiocephalic AV fistula creation;  Surgeon: Annice Needy, MD;  Location: [name] ORS;  Service: Vascular;  Laterality: Right;   AV FISTULA PLACEMENT Left 08/13/2021   Procedure: ARTERIOVENOUS (AV) FISTULA CREATION (BRACHIALCEPHALIC);  Surgeon: Annice Needy, MD;  Location: [name] ORS;  Service: Vascular;  Laterality: Left;   COLONOSCOPY WITH PROPOFOL N/A 10/28/2015   Procedure: COLONOSCOPY WITH PROPOFOL;  Surgeon: Christena Deem, MD;  Location: [facility] ENDOSCOPY;  Service: Endoscopy;  Laterality: N/A;   COLONOSCOPY WITH PROPOFOL N/A 10/29/2015   Procedure: COLONOSCOPY WITH PROPOFOL;  Surgeon: Christena Deem, MD;  Location: [facility] ENDOSCOPY;  Service: Endoscopy;  Laterality: N/A;   CORONARY ULTRASOUND/IVUS N/A 02/26/2020   Procedure: Intravascular Ultrasound/IVUS;  Surgeon: Corky Crafts, MD;  Location: [facility] INVASIVE CV LAB;  Service: Cardiovascular;  Laterality: N/A;   CORONARY/GRAFT ACUTE MI REVASCULARIZATION N/A 02/26/2020   Procedure: Coronary/Graft Acute MI Revascularization;  Surgeon: Corky Crafts, MD;  Location: [facility] INVASIVE CV LAB;  Service: Cardiovascular;  Laterality: N/A;   DIALYSIS/PERMA CATHETER INSERTION Right 04/26/2019   Perm Cath    DIALYSIS/PERMA CATHETER INSERTION N/A 04/26/2019   Procedure: DIALYSIS/PERMA CATHETER INSERTION;  Surgeon: Annice Needy, MD;  Location: [name] INV[facility] CV [facility];  Service: Cardiovascular;  Laterality: N/A;   DIALYSIS/PERMA CATHETER INSERTION N/A 05/25/2021   Procedure: DIALYSIS/PERMA CATHETER INSERTION;  Surgeon: Annice Needy, MD;  Location: [name];  Service: Cardiovascular;  Laterality: N/A;   DIALYSIS/PERMA CATHETER INSERTION N/A 08/31/2021   Procedure: DIALYSIS/PERMA CATHETER INSERTION;  Surgeon: Annice Needy, MD;  Location: [name] INV[facility];  Service: Cardiovascular;  Laterality: N/A;    DIALYSIS/PERMA CATHETER REMOVAL N/A 08/15/2019   Procedure: DIALYSIS/PERMA CATHETER REMOVAL;  Surgeon: Renford Dills, MD;  Location: [facility] INVASIVE CV  LAB;  Service: Cardiovascular;  Laterality: N/A;   DIALYSIS/PERMA CATHETER REMOVAL N/A 03/15/2022   Procedure: DIALYSIS/PERMA CATHETER REMOVAL;  Surgeon: Annice Needy, MD;  Location: [name] INVASIVE CV LAB;  Service: Cardiovascular;  Laterality: N/A;   EMBOLIZATION Right 02/06/2021   Procedure: EMBOLIZATION;  Surgeon: Annice Needy, MD;  Location: [name] INV[facility] CV [facility];  Service: Cardiovascular;  Laterality: Right;  Right Upper Extremity Dialysis Access, Permcath Placement   ESOPHAGOGASTRODUODENOSCOPY (EGD) WITH PROPOFOL N/A 12/27/2017   Procedure: ESOPHAGOGASTRODUODENOSCOPY (EGD) WITH PROPOFOL;  Surgeon: Toledo, Boykin Nearing, MD;  Location: [name] ENDOSCOPY;  Service: Gastroenterology;  Laterality: N/A;   EYE SURGERY     LEFT HEART CATH AND CORONARY ANGIOGRAPHY N/A 02/26/2020   Procedure: LEFT HEART CATH AND CORONARY ANGIOGRAPHY;  Surgeon: Corky Crafts, MD;  Location[facility] INVASIVE CV LAB;  Service: Cardiovascular;  Laterality: N/A;   PROSTATE SURGERY  2016   REVISON OF ARTERIOVENOUS FISTULA Right 07/22/2022   Procedure: RESECTION OF ANEURYSMAL RIGHT ARM ARTERIOVENOUS FISTULA;  Surgeon: Annice Needy, MD;  Location: [name] ORS;  Service: Vascular;  Laterality: Right;   TONSILECTOMY/ADENOIDECTOMY WITH MYRINGOTOMY     TONSILLECTOMY     UPPER EXTREMITY ANGIOGRAPHY Right 11/26/2020   Procedure: Upper Extremity Angiography;  Surgeon: Annice Needy, MD;  Location: [name] INVASIVE CV LAB;  Service: Cardiovascular;  Laterality: Right;   UPPER EXTREMITY ANGIOGRAPHY Right 02/05/2021   Procedure: UPPER EXTREMITY ANGIOGRAPHY;  Surgeon: Annice Needy, MD;  Location: [name] INVASIVE CV LAB;  Service: Cardiovascular;  Laterality: Right;    Family History  Problem Relation Age of Onset   CAD Father    Stroke Father    Diabetes Mellitus II Mother    Kidney failure  Mother    Schizophrenia Mother     Allergies  Allergen Reactions   Promethazine Diarrhea and Other (See Comments)    Muscle cramps   Promethazine Hcl Other (See Comments)    Cramping all over  Other reaction(s): cramp all over   Other     Other reaction(s): Unknown Other reaction(s): Unknown       Latest Ref Rng & Units 07/22/2022    7:34 AM 07/22/2022    7:33 AM 06/08/2022   11:29 AM  CBC  WBC 4.0 - 10.5 K/uL  6.4  6.4   Hemoglobin 13.0 - 17.0 g/dL 16.1  09.6  04.5   Hematocrit 39.0 - 52.0 % 37.0  40.6  37.4   Platelets 150 - 400 K/uL  94  105       CMP     Component Value Date/Time   NA 141 07/22/2022 0734   K 3.8 07/22/2022 0734   CL 111 07/22/2022 0734   CO2 19 (L) 07/22/2022 0733   GLUCOSE 80 07/22/2022 0734   BUN 50 (H) 07/22/2022 0734   CREATININE 13.60 (H) 07/22/2022 0734   CALCIUM 8.5 (L) 07/22/2022 0733   PROT 6.9 06/08/2022 1129   ALBUMIN 3.7 06/08/2022 1129   AST 9 (L) 06/08/2022 1129   ALT 12 06/08/2022 1129   ALKPHOS 82 06/08/2022 1129   BILITOT 0.9 06/08/2022 1129   GFRNONAA 4 (L) 07/22/2022 0733     No results found.     Assessment & Plan:   1. ESRD (end stage renal disease) (HCC) The wound itself is closed with patient still has a fairly large seroma.  This will likely take several weeks to resolve.  Patient is advised to elevate his upper extremity when not in use.  He can also  utilize warm compresses.  Will have patient return in about 3 to 4 weeks to reevaluate as well as to obtain an ultrasound to measure the size of the seroma.   Current Outpatient Medications on File Prior to Visit  Medication Sig Dispense Refill   acetaminophen (TYLENOL) 325 MG tablet Take 1-2 tablets (325-650 mg total) by mouth every 4 (four) hours as needed for mild pain.     amiodarone (PACERONE) 200 MG tablet Take 1 tablet (200 mg total) by mouth every morning. 90 tablet 2   amLODipine (NORVASC) 10 MG tablet Take 1 tablet (10 mg total) by mouth daily. (Patient  taking differently: Take 5 mg by mouth daily.) 30 tablet 2   ammonium lactate (AMLACTIN) 12 % cream Apply 1 Application topically as needed for dry skin. 385 g 0   atorvastatin (LIPITOR) 80 MG tablet TAKE ONE TABLET BY MOUTH AT BEDTIME. 90 tablet 3   calcium acetate (PHOSLO) 667 MG capsule Take 2 capsules (1,334 mg total) by mouth 3 (three) times daily with meals. 180 capsule 0   carvedilol (COREG) 25 MG tablet Take 25 mg by mouth 2 (two) times daily.     cloNIDine (CATAPRES) 0.3 MG tablet Take 1 tablet (0.3 mg total) by mouth every 8 (eight) hours. 90 tablet 2   diphenoxylate-atropine (LOMOTIL) 2.5-0.025 MG tablet Take as needed for diarrhea. Can take up to 4 times daily.     dorzolamide-timolol (COSOPT) 22.3-6.8 MG/ML ophthalmic solution Place 1 drop into the right eye 2 (two) times daily.     hydrALAZINE (APRESOLINE) 25 MG tablet Take 25 mg by mouth 3 (three) times daily.     levETIRAcetam (KEPPRA) 250 MG tablet Take 1 tablet (250 mg total) by mouth Every Tuesday,Thursday,and Saturday with dialysis. 15 tablet 1   levETIRAcetam (KEPPRA) 500 MG tablet Take 1 tablet (500 mg total) by mouth daily after breakfast. 30 tablet 1   lidocaine-prilocaine (EMLA) cream Apply topically.     silver sulfADIAZINE (SILVADENE) 1 % cream Apply 1 Application topically daily. 50 g 0   levETIRAcetam (KEPPRA) 750 MG tablet Take 1 tablet (750 mg total) by mouth at bedtime. (Patient not taking: Reported on 07/15/2022) 60 tablet 2   nitroGLYCERIN (NITROSTAT) 0.4 MG SL tablet Place 1 tablet (0.4 mg total) under the tongue every 5 (five) minutes as needed for chest pain. 25 tablet 6   No current facility-administered medications on file prior to visit.    There are no Patient Instructions on file for this visit. No follow-ups on file.   Georgiana Spinner, NP

## 2022-08-16 ENCOUNTER — Other Ambulatory Visit (INDEPENDENT_AMBULATORY_CARE_PROVIDER_SITE_OTHER): Payer: Self-pay | Admitting: Nurse Practitioner

## 2022-08-16 DIAGNOSIS — N186 End stage renal disease: Secondary | ICD-10-CM

## 2022-08-20 ENCOUNTER — Ambulatory Visit (INDEPENDENT_AMBULATORY_CARE_PROVIDER_SITE_OTHER): Payer: Medicare HMO

## 2022-08-20 ENCOUNTER — Ambulatory Visit (INDEPENDENT_AMBULATORY_CARE_PROVIDER_SITE_OTHER): Payer: Medicare HMO | Admitting: Vascular Surgery

## 2022-08-20 ENCOUNTER — Encounter (INDEPENDENT_AMBULATORY_CARE_PROVIDER_SITE_OTHER): Payer: Self-pay | Admitting: Vascular Surgery

## 2022-08-20 VITALS — BP 154/78 | HR 67 | Resp 18 | Ht 75.0 in | Wt 180.0 lb

## 2022-08-20 DIAGNOSIS — Z794 Long term (current) use of insulin: Secondary | ICD-10-CM | POA: Diagnosis not present

## 2022-08-20 DIAGNOSIS — I10 Essential (primary) hypertension: Secondary | ICD-10-CM

## 2022-08-20 DIAGNOSIS — E1165 Type 2 diabetes mellitus with hyperglycemia: Secondary | ICD-10-CM | POA: Diagnosis not present

## 2022-08-20 DIAGNOSIS — N186 End stage renal disease: Secondary | ICD-10-CM

## 2022-08-20 NOTE — Progress Notes (Signed)
MRN : [id number]  [name]. is a 52 y.o. ([birth year]) male who presents with chief complaint of  Chief Complaint  Patient presents with   Follow-up    Room 5- f/u with HDA  .  History of Present Illness: Patient returns today in follow up of his left brachiocephalic AV fistula.  He has had his nonfunctional right AV fistula resected and that has healed well.  His left brachiocephalic AV fistula is currently working well for dialysis.  It has required previous intervention about 6 months ago.  He currently has no prolonged bleeding, difficulty with access, or pulling clots with dialysis.  Duplex today shows a widely patent left brachiocephalic AV fistula.  Current Outpatient Medications  Medication Sig Dispense Refill   acetaminophen (TYLENOL) 325 MG tablet Take 1-2 tablets (325-650 mg total) by mouth every 4 (four) hours as needed for mild pain.     amiodarone (PACERONE) 200 MG tablet Take 1 tablet (200 mg total) by mouth every morning. 90 tablet 2   amLODipine (NORVASC) 10 MG tablet Take 1 tablet (10 mg total) by mouth daily. (Patient taking differently: Take 5 mg by mouth daily.) 30 tablet 2   ammonium lactate (AMLACTIN) 12 % cream Apply 1 Application topically as needed for dry skin. 385 g 0   atorvastatin (LIPITOR) 80 MG tablet TAKE ONE TABLET BY MOUTH AT BEDTIME. 90 tablet 3   calcium acetate (PHOSLO) 667 MG capsule Take 2 capsules (1,334 mg total) by mouth 3 (three) times daily with meals. 180 capsule 0   carvedilol (COREG) 25 MG tablet Take 25 mg by mouth 2 (two) times daily.     cloNIDine (CATAPRES) 0.3 MG tablet Take 1 tablet (0.3 mg total) by mouth every 8 (eight) hours. 90 tablet 2   diphenoxylate-atropine (LOMOTIL) 2.5-0.025 MG tablet Take as needed for diarrhea. Can take up to 4 times daily.     dorzolamide-timolol (COSOPT) 22.3-6.8 MG/ML ophthalmic solution Place 1 drop into the right eye 2 (two) times daily.     hydrALAZINE (APRESOLINE) 25 MG tablet Take 25 mg by  mouth 3 (three) times daily.     HYDROcodone-acetaminophen (NORCO/VICODIN) 5-325 MG tablet Take 2 tablets by mouth every 6 (six) hours as needed for moderate pain. 20 tablet 0   levETIRAcetam (KEPPRA) 250 MG tablet Take 1 tablet (250 mg total) by mouth Every Tuesday,Thursday,and Saturday with dialysis. 15 tablet 1   levETIRAcetam (KEPPRA) 500 MG tablet Take 1 tablet (500 mg total) by mouth daily after breakfast. 30 tablet 1   levETIRAcetam (KEPPRA) 750 MG tablet Take 1 tablet (750 mg total) by mouth at bedtime. 60 tablet 2   lidocaine-prilocaine (EMLA) cream Apply topically.     nitroGLYCERIN (NITROSTAT) 0.4 MG SL tablet Place 1 tablet (0.4 mg total) under the tongue every 5 (five) minutes as needed for chest pain. 25 tablet 6   silver sulfADIAZINE (SILVADENE) 1 % cream Apply 1 Application topically daily. 50 g 0   No current facility-administered medications for this visit.    Past Medical History:  Diagnosis Date   Acute bilateral cerebral infarction in a watershed distribution [facility]) 04/21/2021   a.) MRI brain 04/21/2021 --> patchy ACUTE cortically-based infarcts within the bilateral high frontal lobes and right parietal lobe   Acute cerebral infarction (HCC) 02/13/2021   a.) MRI brain 02/13/2021: acute LEFT hippocampal infarct   Acute cerebral infarction (HCC) 04/21/2021   a.) MRI brain 04/21/2021: subcentimeter ACUTE infarct within the callosal splenium on the LEFT  Anemia of chronic renal failure    Anxiety    Aortic dilatation (HCC) 11/25/2020   a.) TTE 11/25/2020: Ao root measured 38 mm. b.) TTE 04/22/2021: Ao root measured 44 mm; c. 11/2021 Echo: Ao root 40mm.   Atrial fibrillation (HCC)    a.) CHA2DS2-VASc = 6 (CHF, HTN, CVA x2, prior MI, T2DM). b.) rate/rhythm maintained on oral amiodarone + carvedilol; chronic OAC/AP therapy discontinued following ICH   BPH (benign prostatic hyperplasia)    Cardiac arrest (HCC) 07/26/2021   a.) in setting of hyperkalemia, NSTEMI, and seizure  following missed HD; pulseless and apneic --> required 1 round of CPR prior to ROSC   Cellulitis    Cerebral hemorrhage (HCC) 11/21/2021   a.) CT head 11/21/2021 --> 1.4 x 1.5 x 3.1 cm ACUTE hemorrhage within the inferior aspect of the fourth ventricle, and extending inferiorly through the foramen of foramen of [place] --> occurred in setting of HTN emergency and DOAC/APT-->eliquis/plavix d/c'd.   Cerebral microvascular disease    Chronic diarrhea    Chronic heart failure with preserved ejection fraction (HFpEF) (HCC) 07/19/2017   a.) 06/2017 Echo: EF 75%; b.) 11/2020 Echo: EF 55-60%; c.) 04/2021 Echo: EF 55-60%; d.) 11/2021 Echo: EF 65-70%, GrII DD, nl RV fxn, sev dil LA, large circumferential pericardial eff w/o tamponade, triv MR, AoV sclerosis. Ao root 40mm; e.) TTE 02/28/2025: EF 60-65%, sev LVH, mod posterior LV effusion, AoV sclerosis   Chronic pain of both ankles    Coronary artery disease 02/26/2020   a. 02/2020 Ant STEMI/PCI: LM nl, LAD 93m (3.5x30 Resolute Onyx), RI nl, LCX min irregs, RCA min irregs. EF 65%.   DDD (degenerative disc disease), cervical    Diabetes mellitus, type II (HCC)    Diabetic neuropathy (HCC)    Esophagitis    ESRD (end stage renal disease) on dialysis (HCC)    a.) [facility]; T-Th-Sat   Frequent falls    Gait instability    Gastritis    Gastroparesis    GERD (gastroesophageal reflux disease)    H/O enucleation of left eyeball    Heart palpitations    History of kidney stones    HLD (hyperlipidemia)    Hypertension    Insomnia    a.) uses melatonin PRN   Long term current use of amiodarone    Nausea and vomiting in adult    recurrent   NSTEMI (non-ST elevated myocardial infarction) (HCC) 07/26/2021   a.) hyperkalemic from missed HD; seizures; decompensated to cardiac arrest and required CPR x 1 round prior to ROSC.   Osteomyelitis (HCC)    Pericardial effusion    a. 11/2021 Echo: EF 65-70%, GrII DD, nl RV fxn, sev dil LA, large circumferential  pericardial eff w/o tamponade, triv MR, AoV sclerosis. Ao root 40mm.   Seizure (HCC)    a.) last 07/26/2021 in setting of missed HD --> hyperkalemic at 6.5 --> pulseless/apneic and required CPR; discharged home on levetiracetam.   ST elevation myocardial infarction (STEMI) of anterior wall (HCC) 02/26/2020   a.) LHC/PCI 02/26/2020 --> EF 65%; LVEDP 11 mmHg; 90% mLAD (3.5 x 20 mm Resolute Onyx DES x 1)    Past Surgical History:  Procedure Laterality Date   A/V FISTULAGRAM Left 08/31/2021   Procedure: A/V Fistulagram;  Surgeon: Annice Needy, MD;  Location: [name] INVASIVE CV LAB;  Service: Cardiovascular;  Laterality: Left;   A/V FISTULAGRAM Left 01/25/2022   Procedure: A/V Fistulagram;  Surgeon: Annice Needy, MD;  Location: [name] INVASIVE CV LAB;  Service: Cardiovascular;  Laterality: Left;   AMPUTATION Left 03/16/2016   Procedure: AMPUTATION DIGIT LEFT HALLUX;  Surgeon: Vivi Barrack, DPM;  Location: [facility] OR;  Service: Podiatry;  Laterality: Left;  can start around 5    AMPUTATION Right 02/09/2021   Procedure: AMPUTATION DIGIT;  Surgeon: Marlyne Beards, MD;  Location: [facility] OR;  Service: Orthopedics;  Laterality: Right;   AMPUTATION TOE Right 02/28/2022   Procedure: AMPUTATION TOE;  Surgeon: Louann Sjogren, DPM;  Location: [name] ORS;  Service: Podiatry;  Laterality: Right;  right fifth toe   ARTHROSCOPIC REPAIR ACL Left    AV FISTULA PLACEMENT Right 05/31/2019   Procedure: Brachiocephalic AV fistula creation;  Surgeon: Annice Needy, MD;  Location: [name] ORS;  Service: Vascular;  Laterality: Right;   AV FISTULA PLACEMENT Left 08/13/2021   Procedure: ARTERIOVENOUS (AV) FISTULA CREATION (BRACHIALCEPHALIC);  Surgeon: Annice Needy, MD;  Location: [name] ORS;  Service: Vascular;  Laterality: Left;   COLONOSCOPY WITH PROPOFOL N/A 10/28/2015   Procedure: COLONOSCOPY WITH PROPOFOL;  Surgeon: Christena Deem, MD;  Location: [facility] ENDOSCOPY;  Service: Endoscopy;  Laterality: N/A;   COLONOSCOPY WITH PROPOFOL  N/A 10/29/2015   Procedure: COLONOSCOPY WITH PROPOFOL;  Surgeon: Christena Deem, MD;  Location: [facility] ENDOSCOPY;  Service: Endoscopy;  Laterality: N/A;   CORONARY ULTRASOUND/IVUS N/A 02/26/2020   Procedure: Intravascular Ultrasound/IVUS;  Surgeon: Corky Crafts, MD;  Location: [facility] INVASIVE CV LAB;  Service: Cardiovascular;  Laterality: N/A;   CORONARY/GRAFT ACUTE MI REVASCULARIZATION N/A 02/26/2020   Procedure: Coronary/Graft Acute MI Revascularization;  Surgeon: Corky Crafts, MD;  Location: [facility] INVASIVE CV LAB;  Service: Cardiovascular;  Laterality: N/A;   DIALYSIS/PERMA CATHETER INSERTION Right 04/26/2019   Perm Cath    DIALYSIS/PERMA CATHETER INSERTION N/A 04/26/2019   Procedure: DIALYSIS/PERMA CATHETER INSERTION;  Surgeon: Annice Needy, MD;  Location: [name] INVASIVE CV LAB;  Service: Cardiovascular;  Laterality: N/A;   DIALYSIS/PERMA CATHETER INSERTION N/A 05/25/2021   Procedure: DIALYSIS/PERMA CATHETER INSERTION;  Surgeon: Annice Needy, MD;  Location: [name] INVASIVE CV LAB;  Service: Cardiovascular;  Laterality: N/A;   DIALYSIS/PERMA CATHETER INSERTION N/A 08/31/2021   Procedure: DIALYSIS/PERMA CATHETER INSERTION;  Surgeon: Annice Needy, MD;  Location: [name] INVASIVE CV LAB;  Service: Cardiovascular;  Laterality: N/A;   DIALYSIS/PERMA CATHETER REMOVAL N/A 08/15/2019   Procedure: DIALYSIS/PERMA CATHETER REMOVAL;  Surgeon: Renford Dills, MD;  Location: [name] INVASIVE CV LAB;  Service: Cardiovascular;  Laterality: N/A;   DIALYSIS/PERMA CATHETER REMOVAL N/A 03/15/2022   Procedure: DIALYSIS/PERMA CATHETER REMOVAL;  Surgeon: Annice Needy, MD;  Location: [name] INVASIVE CV LAB;  Service: Cardiovascular;  Laterality: N/A;   EMBOLIZATION Right 02/06/2021   Procedure: EMBOLIZATION;  Surgeon: Annice Needy, MD;  Location: [name] INVASIVE CV LAB;  Service: Cardiovascular;  Laterality: Right;  Right Upper Extremity Dialysis Access, Permcath Placement   ESOPHAGOGASTRODUODENOSCOPY (EGD) WITH  PROPOFOL N/A 12/27/2017   Procedure: ESOPHAGOGASTRODUODENOSCOPY (EGD) WITH PROPOFOL;  Surgeon: Toledo, Boykin Nearing, MD;  Location: [name] ENDOSCOPY;  Service: Gastroenterology;  Laterality: N/A;   EYE SURGERY     LEFT HEART CATH AND CORONARY ANGIOGRAPHY N/A 02/26/2020   Procedure: LEFT HEART CATH AND CORONARY ANGIOGRAPHY;  Surgeon: Corky Crafts, MD;  Location: [facility] INVASIVE CV LAB;  Service: Cardiovascular;  Laterality: N/A;   PROSTATE SURGERY  2016   REVISON OF ARTERIOVENOUS FISTULA Right 07/22/2022   Procedure: RESECTION OF ANEURYSMAL RIGHT ARM ARTERIOVENOUS FISTULA;  Surgeon: Annice Needy, MD;  Location: [name] ORS;  Service: Vascular;  Laterality: Right;  TONSILECTOMY/ADENOIDECTOMY WITH MYRINGOTOMY     TONSILLECTOMY     UPPER EXTREMITY ANGIOGRAPHY Right 11/26/2020   Procedure: Upper Extremity Angiography;  Surgeon: Annice Needy, MD;  Location: [name] INVASIVE CV LAB;  Service: Cardiovascular;  Laterality: Right;   UPPER EXTREMITY ANGIOGRAPHY Right 02/05/2021   Procedure: UPPER EXTREMITY ANGIOGRAPHY;  Surgeon: Annice Needy, MD;  Location: [name] INVASIVE CV LAB;  Service: Cardiovascular;  Laterality: Right;     Social History   Tobacco Use   Smoking status: Never   Smokeless tobacco: Never  Vaping Use   Vaping Use: Never used  Substance Use Topics   Alcohol use: No   Drug use: No       Family History  Problem Relation Age of Onset   CAD Father    Stroke Father    Diabetes Mellitus II Mother    Kidney failure Mother    Schizophrenia Mother      Allergies  Allergen Reactions   Promethazine Diarrhea and Other (See Comments)    Muscle cramps   Promethazine Hcl Other (See Comments)    Cramping all over  Other reaction(s): cramp all over   Other     Other reaction(s): Unknown Other reaction(s): Unknown     REVIEW OF SYSTEMS (Negative unless checked)   Constitutional: [] Weight loss  [] Fever  [] Chills Cardiac: [] Chest pain   [] Chest pressure   [] Palpitations    [] Shortness of breath when laying flat   [] Shortness of breath at rest   [] Shortness of breath with exertion. Vascular:  [] Pain in legs with walking   [] Pain in legs at rest   [] Pain in legs when laying flat   [] Claudication   [] Pain in feet when walking  [] Pain in feet at rest  [] Pain in feet when laying flat   [] History of DVT   [] Phlebitis   [x] Swelling in legs   [] Varicose veins   [] Non-healing ulcers Pulmonary:   [] Uses home oxygen   [] Productive cough   [] Hemoptysis   [] Wheeze  [] COPD   [] Asthma Neurologic:  [] Dizziness  [] Blackouts   [x] Seizures   [] History of stroke   [] History of TIA  [] Aphasia   [] Temporary blindness   [] Dysphagia   [] Weakness or numbness in arms   [] Weakness or numbness in legs Musculoskeletal:  [x] Arthritis   [] Joint swelling   [] Joint pain   [] Low back pain Hematologic:  [] Easy bruising  [] Easy bleeding   [] Hypercoagulable state   [x] Anemic   Gastrointestinal:  [] Blood in stool   [] Vomiting blood  [] Gastroesophageal reflux/heartburn   [] Abdominal pain Genitourinary:  [x] Chronic kidney disease   [] Difficult urination  [] Frequent urination  [] Burning with urination   [] Hematuria Skin:  [] Rashes   [] Ulcers   [] Wounds Psychological:  [x] History of anxiety   []  History of major depression.  Physical Examination  BP (!) 154/78 (BP Location: Right Wrist)   Pulse 67   Resp 18   Ht 6\' 3"  (1.905 m)   Wt 180 lb (81.6 kg)   BMI 22.50 kg/m  Gen:  WD/WN, NAD Head: [place]/AT, No temporalis wasting. Ear/Nose/Throat: Hearing grossly intact, nares w/o erythema or drainage Eyes: Patch over the left eye. Neck: Supple.  Trachea midline Pulmonary:  Good air movement, no use of accessory muscles.  Cardiac: RRR, no JVD Vascular: Excellent thrill is present in left brachiocephalic AV fistula. Vessel Right Left  Radial Palpable Palpable               Musculoskeletal: M/S 5/5 throughout.  No deformity or  atrophy.  Neurologic: Sensation grossly intact in extremities.  Symmetrical.   Speech is fluent.  Psychiatric: Judgment intact, Mood & affect appropriate for pt's clinical situation. Dermatologic: No rashes or ulcers noted.  No cellulitis or open wounds.      Labs Recent Results (from the past 2160 hour(s))  CBG monitoring, ED     Status: Abnormal   Collection Time: 06/08/22  9:49 AM  Result Value Ref Range   Glucose-Capillary 151 (H) 70 - 99 mg/dL    Comment: Glucose reference range applies only to samples taken after fasting for at least 8 hours.  CBG monitoring, ED     Status: Abnormal   Collection Time: 06/08/22 11:21 AM  Result Value Ref Range   Glucose-Capillary 146 (H) 70 - 99 mg/dL    Comment: Glucose reference range applies only to samples taken after fasting for at least 8 hours.  Comprehensive metabolic panel     Status: Abnormal   Collection Time: 06/08/22 11:29 AM  Result Value Ref Range   Sodium 139 135 - 145 mmol/L   Potassium 3.8 3.5 - 5.1 mmol/L   Chloride 111 98 - 111 mmol/L   CO2 14 (L) 22 - 32 mmol/L   Glucose, Bld 51 (L) 70 - 99 mg/dL    Comment: Glucose reference range applies only to samples taken after fasting for at least 8 hours.   BUN 78 (H) 6 - 20 mg/dL   Creatinine, Ser 16.10 (H) 0.61 - 1.24 mg/dL   Calcium 8.5 (L) 8.9 - 10.3 mg/dL   Total Protein 6.9 6.5 - 8.1 g/dL   Albumin 3.7 3.5 - 5.0 g/dL   AST 9 (L) 15 - 41 U/L   ALT 12 0 - 44 U/L   Alkaline Phosphatase 82 38 - 126 U/L   Total Bilirubin 0.9 0.3 - 1.2 mg/dL   GFR, Estimated 4 (L) >60 mL/min    Comment: (NOTE) Calculated using the CKD-EPI Creatinine Equation (2021)    Anion gap 14 5 - 15    Comment: Performed at [facility], [address] [place], [place] [address]  CBC with Differential     Status: Abnormal   Collection Time: 06/08/22 11:29 AM  Result Value Ref Range   WBC 6.4 4.0 - 10.5 K/uL   RBC 5.28 4.22 - 5.81 MIL/uL   Hemoglobin 11.7 (L) 13.0 - 17.0 g/dL   HCT 40.9 (L) 81.1 - 91.4 %   MCV 70.8 (L) 80.0 - 100.0 fL   MCH 22.2 (L) 26.0 - 34.0 pg   MCHC  31.3 30.0 - 36.0 g/dL   RDW 78.2 (H) 95.6 - 21.3 %   Platelets 105 (L) 150 - 400 K/uL    Comment: Immature Platelet Fraction may be clinically indicated, consider ordering this additional test [id number]    nRBC 1.4 (H) 0.0 - 0.2 %   Neutrophils Relative % 64 %   Neutro Abs 4.2 1.7 - 7.7 K/uL   Lymphocytes Relative 15 %   Lymphs Abs 0.9 0.7 - 4.0 K/uL   Monocytes Relative 13 %   Monocytes Absolute 0.9 0.1 - 1.0 K/uL   Eosinophils Relative 6 %   Eosinophils Absolute 0.4 0.0 - 0.5 K/uL   Basophils Relative 1 %   Basophils Absolute 0.1 0.0 - 0.1 K/uL   WBC Morphology MORPHOLOGY UNREMARKABLE    RBC Morphology ANISOCYTOSIS    Smear Review MORPHOLOGY UNREMARKABLE    Immature Granulocytes 1 %   Abs Immature Granulocytes 0.06 0.00 - 0.07  K/uL   Schistocytes PRESENT    Polychromasia PRESENT    Target Cells PRESENT    Ovalocytes PRESENT     Comment: Performed at [facility], [address], [place], [place] [address]  Type and screen     Status: None   Collection Time: 07/22/22  7:19 AM  Result Value Ref Range   ABO/RH(D) A POS    Antibody Screen NEG    Sample Expiration      07/25/2022,2359 Performed at [facility], [address] [place], [place] [address]   Basic metabolic panel     Status: Abnormal   Collection Time: 07/22/22  7:33 AM  Result Value Ref Range   Sodium 143 135 - 145 mmol/L   Potassium 3.8 3.5 - 5.1 mmol/L   Chloride 110 98 - 111 mmol/L   CO2 19 (L) 22 - 32 mmol/L   Glucose, Bld 83 70 - 99 mg/dL    Comment: Glucose reference range applies only to samples taken after fasting for at least 8 hours.   BUN 69 (H) 6 - 20 mg/dL   Creatinine, Ser 29.56 (H) 0.61 - 1.24 mg/dL   Calcium 8.5 (L) 8.9 - 10.3 mg/dL   GFR, Estimated 4 (L) >60 mL/min    Comment: (NOTE) Calculated using the CKD-EPI Creatinine Equation (2021)    Anion gap 14 5 - 15    Comment: Performed at [facility], [address] [place], [place] [address]  CBC with  Differential/Platelet     Status: Abnormal   Collection Time: 07/22/22  7:33 AM  Result Value Ref Range   WBC 6.4 4.0 - 10.5 K/uL   RBC 5.53 4.22 - 5.81 MIL/uL   Hemoglobin 12.1 (L) 13.0 - 17.0 g/dL   HCT 65.7 84.6 - 96.2 %   MCV 73.4 (L) 80.0 - 100.0 fL   MCH 21.9 (L) 26.0 - 34.0 pg   MCHC 29.8 (L) 30.0 - 36.0 g/dL   RDW 95.2 (H) 84.1 - 32.4 %   Platelets 94 (L) 150 - 400 K/uL    Comment: PLATELET COUNT CONFIRMED BY SMEAR   nRBC 0.0 0.0 - 0.2 %   Neutrophils Relative % 59 %   Neutro Abs 3.8 1.7 - 7.7 K/uL   Lymphocytes Relative 17 %   Lymphs Abs 1.1 0.7 - 4.0 K/uL   Monocytes Relative 11 %   Monocytes Absolute 0.7 0.1 - 1.0 K/uL   Eosinophils Relative 12 %   Eosinophils Absolute 0.8 (H) 0.0 - 0.5 K/uL   Basophils Relative 1 %   Basophils Absolute 0.1 0.0 - 0.1 K/uL   WBC Morphology MORPHOLOGY UNREMARKABLE    RBC Morphology MORPHOLOGY UNREMARKABLE    Smear Review Normal platelet morphology    Immature Granulocytes 0 %   Abs Immature Granulocytes 0.01 0.00 - 0.07 K/uL   Ovalocytes PRESENT     Comment: Performed at [facility], [address] [place], [place] [address]  I-STAT, [place] 8     Status: Abnormal   Collection Time: 07/22/22  7:34 AM  Result Value Ref Range   Sodium 141 135 - 145 mmol/L   Potassium 3.8 3.5 - 5.1 mmol/L   Chloride 111 98 - 111 mmol/L   BUN 50 (H) 6 - 20 mg/dL   Creatinine, Ser 72.53 (H) 0.61 - 1.24 mg/dL   Glucose, Bld 80 70 - 99 mg/dL    Comment: Glucose reference range applies only to samples taken after fasting for at least 8  hours.   Calcium, Ion 1.11 (L) 1.15 - 1.40 mmol/L   TCO2 17 (L) 22 - 32 mmol/L   Hemoglobin 12.6 (L) 13.0 - 17.0 g/dL   HCT 86.5 (L) 78.4 - 69.6 %  Surgical pathology     Status: None   Collection Time: 07/22/22 10:10 AM  Result Value Ref Range   SURGICAL PATHOLOGY      SURGICAL PATHOLOGY CASE: [id number] PATIENT: Ninetta Lights Surgical Pathology Report     Specimen Submitted: A. Fistula, right  AV  Clinical History: ESRD      DIAGNOSIS: A.  AV FISTULA, RIGHT; EXCISION: - FRAGMENTS OF DILATED VASCULAR STRUCTURE WITH ORGANIZING THROMBUS.  GROSS DESCRIPTION: A. Labeled: Right AV fistula Received: Fresh Collection time: 10:10 AM on 07/22/2022 Placed into formalin time: 10:30 AM on 07/22/2022 Tissue fragment(s): 1 Size: 7.5 x 2.5 x 2.2 cm Description: Received is a portion of tan tubular membranous tissue with multiple embedded fragments of gray metallic coiled wires.  The lumen contains friable red-brown soft tissue. Representative sections are submitted in 1 cassette.  RB 07/22/2022  Final Diagnosis performed by Elijah Birk, MD.   Electronically signed 07/27/2022 10:00:41AM The electronic signature indicates that the named Attending Pathologist has evaluated the specimen Technical component performed at [facility], [address], [place], [place] [address] Lab: [phone] Dir: Jolene Schimke, MD, MMM  Professional component performed at [facility], [address] [place], [place], [place] [address] Lab: [id number] Dir: Beryle Quant, MD   Glucose, capillary     Status: Abnormal   Collection Time: 07/22/22 10:47 AM  Result Value Ref Range   Glucose-Capillary 69 (L) 70 - 99 mg/dL    Comment: Glucose reference range applies only to samples taken after fasting for at least 8 hours.  Glucose, capillary     Status: Abnormal   Collection Time: 07/22/22 11:11 AM  Result Value Ref Range   Glucose-Capillary 68 (L) 70 - 99 mg/dL    Comment: Glucose reference range applies only to samples taken after fasting for at least 8 hours.  Glucose, capillary     Status: None   Collection Time: 07/22/22 11:38 AM  Result Value Ref Range   Glucose-Capillary 70 70 - 99 mg/dL    Comment: Glucose reference range applies only to samples taken after fasting for at least 8 hours.   Comment 1 Notify RN    Comment 2 Document in Chart     Radiology No results  found.  Assessment/Plan Essential hypertension, benign blood pressure control important in reducing the progression of atherosclerotic disease. On appropriate oral medications.  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     Uncontrolled type 2 diabetes mellitus with hyperglycemia, with long-term current use of insulin (HCC) blood glucose control important in reducing the progression of atherosclerotic disease. Also, involved in wound healing. On appropriate medications.  ESRD (end stage renal disease) (HCC) Duplex today shows a widely patent left brachiocephalic AV fistula.  Has required intervention in the past but currently working well.  Recheck in 6 months with duplex.    Festus Barren, MD  08/20/2022 11:56 AM    This note was created with Dragon medical transcription system.  Any errors from dictation are purely unintentional

## 2022-08-20 NOTE — Assessment & Plan Note (Signed)
Duplex today shows a widely patent left brachiocephalic AV fistula.  Has required intervention in the past but currently working well.  Recheck in 6 months with duplex.

## 2022-08-31 ENCOUNTER — Other Ambulatory Visit (INDEPENDENT_AMBULATORY_CARE_PROVIDER_SITE_OTHER): Payer: Self-pay | Admitting: Nurse Practitioner

## 2022-08-31 DIAGNOSIS — N186 End stage renal disease: Secondary | ICD-10-CM

## 2022-09-10 ENCOUNTER — Ambulatory Visit (INDEPENDENT_AMBULATORY_CARE_PROVIDER_SITE_OTHER): Payer: Medicare HMO | Admitting: Nurse Practitioner

## 2022-09-10 ENCOUNTER — Encounter (INDEPENDENT_AMBULATORY_CARE_PROVIDER_SITE_OTHER): Payer: Medicare HMO

## 2022-10-01 ENCOUNTER — Ambulatory Visit: Payer: Medicare HMO | Admitting: Interventional Cardiology

## 2022-10-05 ENCOUNTER — Ambulatory Visit
Admission: EM | Admit: 2022-10-05 | Discharge: 2022-10-05 | Disposition: A | Discharge status: Home / Self Care | Payer: Medicare HMO | Attending: Nurse Practitioner | Admitting: Nurse Practitioner

## 2022-10-05 ENCOUNTER — Encounter: Payer: Self-pay | Admitting: Emergency Medicine

## 2022-10-05 DIAGNOSIS — J069 Acute upper respiratory infection, unspecified: Secondary | ICD-10-CM

## 2022-10-05 DIAGNOSIS — K649 Unspecified hemorrhoids: Secondary | ICD-10-CM | POA: Diagnosis not present

## 2022-10-05 MED ORDER — HYDROCORTISONE (PERIANAL) 2.5 % EX CREA: 1.0000 | TOPICAL_CREAM | Freq: Two times a day (BID) | CUTANEOUS | 0 refills | Status: DC

## 2022-10-05 MED ORDER — GUAIFENESIN 100 MG/5ML PO LIQD: 10.0000 mL | Freq: Three times a day (TID) | ORAL | 0 refills | Status: AC | PRN

## 2022-10-05 MED ORDER — AMOXICILLIN-POT CLAVULANATE 875-125 MG PO TABS: 1.0000 | ORAL_TABLET | Freq: Two times a day (BID) | ORAL | 0 refills | Status: DC

## 2022-10-05 MED ORDER — FLUTICASONE PROPIONATE 50 MCG/ACT NA SUSP: 2.0000 | Freq: Every day | NASAL | 0 refills | Status: DC

## 2022-10-05 NOTE — ED Triage Notes (Signed)
Nasal congestion and cough x 1 week.  Eye redness and swelling to left eye since Sunday.  Just had dialysis today.

## 2022-10-05 NOTE — Discharge Instructions (Signed)
For your upper respiratory symptoms: Take medication as prescribed. Drink fluid according to fluid restrictions prescribed by your doctor, and allow for plenty of rest. Recommend over-the-counter Tylenol as needed for pain, fever, or general discomfort.  You may take Tylenol arthritis strength 650 mg tablets if needed. May use normal saline nasal spray throughout the day to help with nasal congestion and runny nose. Recommend using a humidifier in your bedroom at nighttime during sleep and sleeping elevated on pillows while cough symptoms persist.  For your hemorrhoids: Use medication as prescribed. Recommend using a stool softener such as over-the-counter senna to help with bowel movements during hemorrhoid flare. Drink fluid according to fluid restrictions prescribed by your doctor, and allow for plenty of rest. Warm Epsom salt soaks as tolerated to help with rectal pain or discomfort. If symptoms or not improving with this treatment, please follow-up with your primary care physician for further evaluation.  Follow-up as needed.

## 2022-10-05 NOTE — ED Provider Notes (Signed)
[facility]    CSN: [id number] Arrival date & time: 10/05/22  1000      History   Chief Complaint No chief complaint on file.   HPI [name]. is a 52 y.o. male.   The history is provided by the patient and the spouse.   Patient presents with his wife with a 1 week history of cough and nasal congestion.  Patient's wife states that patient's symptoms have not improved, she has been administering over-the-counter cough and cold medications with minimal relief.  Patient's wife denies fever, chills, headache, sore throat, wheezing, shortness of breath, difficulty breathing, chest pain, abdominal pain, nausea, vomiting, or diarrhea.  Patient spouse states patient is on dialysis, as they just left prior to this appointment.  Patient and spouse deny any obvious known sick contacts.  Patient also with complaints of hemorrhoids.  Patient spouse states symptoms have worsened over the past several days, which caused him to miss dialysis on 2 occasions last week.  Patient has been using an over-the-counter cream with minimal relief of symptoms.  Patient's spouse denies fever, chills, abdominal pain, bloody stools, melena stools, or diarrhea.  Patient with difficulty with bowel movements since symptoms started.  This is a chronic condition for the patient.  Past Medical History:  Diagnosis Date   Acute bilateral cerebral infarction in a watershed distribution [facility]) 04/21/2021   a.) MRI brain 04/21/2021 --> patchy ACUTE cortically-based infarcts within the bilateral high frontal lobes and right parietal lobe   Acute cerebral infarction (HCC) 02/13/2021   a.) MRI brain 02/13/2021: acute LEFT hippocampal infarct   Acute cerebral infarction (HCC) 04/21/2021   a.) MRI brain 04/21/2021: subcentimeter ACUTE infarct within the callosal splenium on the LEFT   Anemia of chronic renal failure    Anxiety    Aortic dilatation (HCC) 11/25/2020   a.) TTE 11/25/2020: Ao root measured 38  mm. b.) TTE 04/22/2021: Ao root measured 44 mm; c. 11/2021 Echo: Ao root 40mm.   Atrial fibrillation (HCC)    a.) CHA2DS2-VASc = 6 (CHF, HTN, CVA x2, prior MI, T2DM). b.) rate/rhythm maintained on oral amiodarone + carvedilol; chronic OAC/AP therapy discontinued following ICH   BPH (benign prostatic hyperplasia)    Cardiac arrest (HCC) 07/26/2021   a.) in setting of hyperkalemia, NSTEMI, and seizure following missed HD; pulseless and apneic --> required 1 round of CPR prior to ROSC   Cellulitis    Cerebral hemorrhage (HCC) 11/21/2021   a.) CT head 11/21/2021 --> 1.4 x 1.5 x 3.1 cm ACUTE hemorrhage within the inferior aspect of the fourth ventricle, and extending inferiorly through the foramen of foramen of [place] --> occurred in setting of HTN emergency and DOAC/APT-->eliquis/plavix d/c'd.   Cerebral microvascular disease    Chronic diarrhea    Chronic heart failure with preserved ejection fraction (HFpEF) (HCC) 07/19/2017   a.) 06/2017 Echo: EF 75%; b.) 11/2020 Echo: EF 55-60%; c.) 04/2021 Echo: EF 55-60%; d.) 11/2021 Echo: EF 65-70%, GrII DD, nl RV fxn, sev dil LA, large circumferential pericardial eff w/o tamponade, triv MR, AoV sclerosis. Ao root 40mm; e.) TTE 02/28/2025: EF 60-65%, sev LVH, mod posterior LV effusion, AoV sclerosis   Chronic pain of both ankles    Coronary artery disease 02/26/2020   a. 02/2020 Ant STEMI/PCI: LM nl, LAD 44m (3.5x30 Resolute Onyx), RI nl, LCX min irregs, RCA min irregs. EF 65%.   DDD (degenerative disc disease), cervical    Diabetes mellitus, type II (HCC)    Diabetic neuropathy (  HCC)    Esophagitis    ESRD (end stage renal disease) on dialysis (HCC)    a.) [facility]; T-Th-Sat   Frequent falls    Gait instability    Gastritis    Gastroparesis    GERD (gastroesophageal reflux disease)    H/O enucleation of left eyeball    Heart palpitations    History of kidney stones    HLD (hyperlipidemia)    Hypertension    Insomnia    a.) uses melatonin PRN   Long  term current use of amiodarone    Nausea and vomiting in adult    recurrent   NSTEMI (non-ST elevated myocardial infarction) (HCC) 07/26/2021   a.) hyperkalemic from missed HD; seizures; decompensated to cardiac arrest and required CPR x 1 round prior to ROSC.   Osteomyelitis (HCC)    Pericardial effusion    a. 11/2021 Echo: EF 65-70%, GrII DD, nl RV fxn, sev dil LA, large circumferential pericardial eff w/o tamponade, triv MR, AoV sclerosis. Ao root 40mm.   Seizure (HCC)    a.) last 07/26/2021 in setting of missed HD --> hyperkalemic at 6.5 --> pulseless/apneic and required CPR; discharged home on levetiracetam.   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   PVD (peripheral vascular disease) (HCC) 02/26/2022   History of seizure 02/26/2022   Protein-calorie malnutrition, severe 12/04/2021   Pressure injury of skin 11/29/2021   Hypertensive emergency    Bacteremia associated with intravascular line (HCC)    Intracranial bleed (HCC) 11/21/2021   ICH (intracerebral hemorrhage) (HCC) 11/21/2021   Seizure (HCC) 07/26/2021   Seasonal allergic rhinitis 07/08/2021   Immunization due 07/08/2021   Hyperglycemia due to type 2 diabetes mellitus (HCC) 07/08/2021   Diabetic renal disease (HCC) 07/08/2021   Aspiration pneumonia (HCC) 06/27/2021   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) 02/06/2021   Folate deficiency 02/06/2021   Hyperkalemia 02/05/2021   Hyperphosphatemia 02/05/2021   Hypermagnesemia 02/05/2021   Coronary artery disease 02/05/2021   Hypernatremia 02/05/2021   Wound infection 02/04/2021   Infected wound 01/29/2021   Wound dehiscence, surgical, initial encounter 01/29/2021   ESRD on hemodialysis (HCC)    Prolonged Q-T interval on ECG    Anemia due to chronic kidney disease, on chronic dialysis (HCC)    Atrial fibrillation with RVR (HCC) 12/08/2020   Finger amputation, 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 12/08/2020   Atrial fibrillation with rapid ventricular response (HCC)    Postural dizziness with presyncope 11/24/2020   Elevated troponin 11/24/2020   Left leg cellulitis 11/24/2020  Frequent falls 11/24/2020   Osteomyelitis of finger of right hand (HCC) 11/24/2020   Paroxysmal atrial fibrillation (HCC) 11/24/2020   Hypotension 11/24/2020   NSTEMI (non-ST elevated myocardial infarction) (HCC) 11/24/2020   Abnormal gait 08/19/2020   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) 08/19/2020   Anxiety state 08/19/2020   Callosity 08/19/2020   ED (erectile dysfunction) of organic origin 08/19/2020   Heart failure (HCC) 08/19/2020   Long term (current) use of insulin (HCC) 08/19/2020   Myalgia 08/19/2020   Obstructive uropathy 08/19/2020   Reactive depression 08/19/2020   CAD/ H/o prior STEMI 02/2020 s/p DES to LAD 08/19/2020   ESRD (end stage renal disease) (HCC) 08/19/2020   Blindness of left eye 08/15/2020   Combined forms of age-related cataract of right eye 04/17/2020    Supraventricular tachycardia 02/26/2020   Acute anterior wall MI (HCC)    Benign prostatic hyperplasia with lower urinary tract symptoms 11/07/2019   Fluid overload, unspecified 08/15/2019   Hypertension secondary to other renal disorders 07/20/2019   NVG (neovascular glaucoma), left, 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   Anemia 06/05/2019   Aftercare including intermittent dialysis (HCC) 05/15/2019   Moderate protein-calorie malnutrition (HCC) 05/14/2019   Anaphylactic reaction due to adverse effect of correct drug or medicament properly administered, initial encounter 05/08/2019   Chronic anemia 05/07/2019   Chest pain, unspecified 05/07/2019   Coagulation defect, unspecified (HCC) 05/07/2019   Disorder of phosphorus metabolism, unspecified 05/07/2019   Encounter for fitting and adjustment of extracorporeal dialysis catheter (HCC) 05/07/2019   Iron deficiency anemia, unspecified 05/07/2019   Pain, unspecified 05/07/2019   Pruritus, unspecified 05/07/2019   Dizziness 04/06/2019   Metabolic acidosis 04/06/2019   Syncope 04/06/2019   Urinary retention 04/06/2019   Hypertensive chronic kidney disease with stage 1 through stage 4 chronic kidney disease, or unspecified chronic kidney disease 11/28/2018   Edema 11/28/2018   Hypokalemia 11/28/2018   Hypo-osmolality and hyponatremia 11/28/2018   Proteinuria 11/28/2018   Secondary hyperparathyroidism of renal origin (HCC) 11/28/2018   Lymphedema 01/09/2018   Chronic Grade 2 diastolic heart failure/EF 55 to 60% 12/13/2017   Vomiting 11/28/2017   Acute on chronic heart failure (HCC) 11/23/2017   Diabetes mellitus, type II (HCC) 11/23/2017   Diabetic retinopathy (HCC) 07/05/2017   Uncontrolled type 2 diabetes mellitus with hyperglycemia, with long-term current use of insulin (HCC) 04/20/2016   Osteomyelitis of toe of left foot (HCC) 03/15/2016   Acute renal failure with tubular necrosis (HCC) 06/23/2015   Diarrhea 06/23/2015   Mixed  hyperlipidemia    Essential hypertension, benign    Heart palpitations 05/06/2015   Sepsis (HCC) 03/07/2015    Past Surgical History:  Procedure Laterality Date   A/V FISTULAGRAM Left 08/31/2021   Procedure: A/V Fistulagram;  Surgeon: Annice Needy, MD;  Location: [name] INVASIVE CV LAB;  Service: Cardiovascular;  Laterality: Left;   A/V FISTULAGRAM Left 01/25/2022   Procedure: A/V Fistulagram;  Surgeon: Annice Needy, MD;  Location: [name];  Service: Cardiovascular;  Laterality: Left;   AMPUTATION Left 03/16/2016   Procedure: AMPUTATION DIGIT LEFT HALLUX;  Surgeon: Vivi Barrack, DPM;  Location: [facility] OR;  Service: Podiatry;  Laterality: Left;  can start around 5    AMPUTATION Right 02/09/2021   Procedure: AMPUTATION DIGIT;  Surgeon: Marlyne Beards, MD;  Location: [facility] OR;  Service: Orthopedics;  Laterality: Right;   AMPUTATION TOE Right 02/28/2022   Procedure: AMPUTATION TOE;  Surgeon: Louann Sjogren, DPM;  Location: [name] ORS;  Service: Podiatry;  Laterality: Right;  right fifth toe   ARTHROSCOPIC REPAIR ACL Left    AV FISTULA PLACEMENT Right 05/31/2019   Procedure: Brachiocephalic AV fistula creation;  Surgeon: Annice Needy, MD;  Location: [name] ORS;  Service: Vascular;  Laterality: Right;   AV FISTULA PLACEMENT Left 08/13/2021   Procedure: ARTERIOVENOUS (AV) FISTULA CREATION (BRACHIALCEPHALIC);  Surgeon: Annice Needy, MD;  Location: [name] ORS;  Service: Vascular;  Laterality: Left;   COLONOSCOPY WITH PROPOFOL N/A 10/28/2015   Procedure: COLONOSCOPY WITH PROPOFOL;  Surgeon: Christena Deem, MD;  Location: [facility] ENDOSCOPY;  Service: Endoscopy;  Laterality: N/A;   COLONOSCOPY WITH PROPOFOL N/A 10/29/2015   Procedure: COLONOSCOPY WITH PROPOFOL;  Surgeon: Christena Deem, MD;  Location: [facility] ENDOSCOPY;  Service: Endoscopy;  Laterality: N/A;   CORONARY ULTRASOUND/IVUS N/A 02/26/2020   Procedure: Intravascular Ultrasound/IVUS;  Surgeon: Corky Crafts, MD;  Location: [facility]  INVASIVE CV LAB;  Service: Cardiovascular;  Laterality: N/A;   CORONARY/GRAFT ACUTE MI REVASCULARIZATION N/A 02/26/2020   Procedure: Coronary/Graft Acute MI Revascularization;  Surgeon: Corky Crafts, MD;  Location: [facility] INVASIVE CV LAB;  Service: Cardiovascular;  Laterality: N/A;   DIALYSIS/PERMA CATHETER INSERTION Right 04/26/2019   Perm Cath    DIALYSIS/PERMA CATHETER INSERTION N/A 04/26/2019   Procedure: DIALYSIS/PERMA CATHETER INSERTION;  Surgeon: Annice Needy, MD;  Location: [name];  Service: Cardiovascular;  Laterality: N/A;   DIALYSIS/PERMA CATHETER INSERTION N/A 05/25/2021   Procedure: DIALYSIS/PERMA CATHETER INSERTION;  Surgeon: Annice Needy, MD;  Location: [name];  Service: Cardiovascular;  Laterality: N/A;   DIALYSIS/PERMA CATHETER INSERTION N/A 08/31/2021   Procedure: DIALYSIS/PERMA CATHETER INSERTION;  Surgeon: Annice Needy, MD;  Location: [name];  Service: Cardiovascular;  Laterality: N/A;   DIALYSIS/PERMA CATHETER REMOVAL N/A 08/15/2019   Procedure: DIALYSIS/PERMA CATHETER REMOVAL;  Surgeon: Renford Dills, MD;  Location: [name];  Service: Cardiovascular;  Laterality: N/A;   DIALYSIS/PERMA CATHETER REMOVAL N/A 03/15/2022   Procedure: DIALYSIS/PERMA CATHETER REMOVAL;  Surgeon: Annice Needy, MD;  Location: [name];  Service: Cardiovascular;  Laterality: N/A;   EMBOLIZATION (CATH LAB) Right 02/06/2021   Procedure: EMBOLIZATION;  Surgeon: Annice Needy, MD;  Location: [name];  Service: Cardiovascular;  Laterality: Right;  Right Upper Extremity Dialysis Access, Permcath Placement   ESOPHAGOGASTRODUODENOSCOPY (EGD) WITH PROPOFOL N/A 12/27/2017   Procedure: ESOPHAGOGASTRODUODENOSCOPY (EGD) WITH PROPOFOL;  Surgeon: Toledo, Boykin Nearing, MD;  Location: [name] ENDOSCOPY;  Service: Gastroenterology;  Laterality: N/A;   EYE SURGERY     LEFT HEART CATH AND CORONARY ANGIOGRAPHY N/A 02/26/2020   Procedure: LEFT  HEART CATH AND CORONARY ANGIOGRAPHY;  Surgeon: Corky Crafts, MD;  Location: [facility] INVASIVE CV LAB;  Service: Cardiovascular;  Laterality: N/A;   PROSTATE SURGERY  2016   REVISON OF ARTERIOVENOUS FISTULA Right 07/22/2022   Procedure: RESECTION OF ANEURYSMAL RIGHT ARM ARTERIOVENOUS FISTULA;  Surgeon: Annice Needy, MD;  Location: [name] ORS;  Service: Vascular;  Laterality: Right;   TONSILECTOMY/ADENOIDECTOMY WITH MYRINGOTOMY     TONSILLECTOMY     UPPER EXTREMITY ANGIOGRAPHY Right 11/26/2020   Procedure: Upper Extremity Angiography;  Surgeon: Annice Needy, MD;  Location: [name];  Service: Cardiovascular;  Laterality: Right;   UPPER EXTREMITY ANGIOGRAPHY Right 02/05/2021   Procedure: UPPER EXTREMITY ANGIOGRAPHY;  Surgeon: Annice Needy, MD;  Location: [name] IN[facility];  Service: Cardiovascular;  Laterality: Right;       Home Medications    Prior  to Admission medications   Medication Sig Start Date End Date Taking? Authorizing Provider  amoxicillin-clavulanate (AUGMENTIN) 875-125 MG tablet Take 1 tablet by mouth every 12 (twelve) hours for 5 days. 10/05/22 10/10/22 Yes -Warren, Sadie Haber, NP  fluticasone (FLONASE) 50 MCG/ACT nasal spray Place 2 sprays into both nostrils daily. 10/05/22  Yes -Warren, Sadie Haber, NP  guaiFENesin (ROBITUSSIN) 100 MG/5ML liquid Take 10 mLs by mouth every 8 (eight) hours as needed for up to 10 days for cough or to loosen phlegm. 10/05/22 10/15/22 Yes -Warren, Sadie Haber, NP  hydrocortisone (ANUSOL-HC) 2.5 % rectal cream Place 1 Application rectally 2 (two) times daily. 10/05/22  Yes -Warren, Sadie Haber, NP  acetaminophen (TYLENOL) 325 MG tablet Take 1-2 tablets (325-650 mg total) by mouth every 4 (four) hours as needed for mild pain. 05/04/21   Love, Evlyn Kanner, PA-C  amiodarone (PACERONE) 200 MG tablet Take 1 tablet (200 mg total) by mouth every morning. 08/02/22 08/03/23  Corky Crafts, MD  amLODipine (NORVASC) 10 MG tablet Take 1  tablet (10 mg total) by mouth daily. Patient taking differently: Take 5 mg by mouth daily. 02/28/22   Lurene Shadow, MD  ammonium lactate (AMLACTIN) 12 % cream Apply 1 Application topically as needed for dry skin. 05/25/22   Vivi Barrack, DPM  atorvastatin (LIPITOR) 80 MG tablet TAKE ONE TABLET BY MOUTH AT BEDTIME. 05/07/22   Corky Crafts, MD  calcium acetate (PHOSLO) 667 MG capsule Take 2 capsules (1,334 mg total) by mouth 3 (three) times daily with meals. 05/04/21   Love, Evlyn Kanner, PA-C  carvedilol (COREG) 25 MG tablet Take 25 mg by mouth 2 (two) times daily. 06/16/21   [provider]  cloNIDine (CATAPRES) 0.3 MG tablet Take 1 tablet (0.3 mg total) by mouth every 8 (eight) hours. 12/03/21   Osvaldo Shipper, MD  diphenoxylate-atropine (LOMOTIL) 2.5-0.025 MG tablet Take as needed for diarrhea. Can take up to 4 times daily. 03/05/22   [provider]  dorzolamide-timolol (COSOPT) 22.3-6.8 MG/ML ophthalmic solution Place 1 drop into the right eye 2 (two) times daily.    [provider]  hydrALAZINE (APRESOLINE) 25 MG tablet Take 25 mg by mouth 3 (three) times daily. 03/30/22   [provider]  HYDROcodone-acetaminophen (NORCO/VICODIN) 5-325 MG tablet Take 2 tablets by mouth every 6 (six) hours as needed for moderate pain. 08/04/22 08/04/23  Georgiana Spinner, NP  levETIRAcetam (KEPPRA) 250 MG tablet Take 1 tablet (250 mg total) by mouth Every Tuesday,Thursday,and Saturday with dialysis. 04/08/22   Cleora Fleet, MD  levETIRAcetam (KEPPRA) 500 MG tablet Take 1 tablet (500 mg total) by mouth daily after breakfast. 04/08/22   Laural Benes, Clanford L, MD  levETIRAcetam (KEPPRA) 750 MG tablet Take 1 tablet (750 mg total) by mouth at bedtime. 04/08/22   Johnson, Clanford L, MD  lidocaine-prilocaine (EMLA) cream Apply topically. 12/24/21   [provider]  nitroGLYCERIN (NITROSTAT) 0.4 MG SL tablet Place 1 tablet (0.4 mg total) under the tongue every 5 (five)  minutes as needed for chest pain. 03/24/22   Corky Crafts, MD  silver sulfADIAZINE (SILVADENE) 1 % cream Apply 1 Application topically daily. 05/25/22   Vivi Barrack, DPM    Family History Family History  Problem Relation Age of Onset   CAD Father    Stroke Father    Diabetes Mellitus II Mother    Kidney failure Mother    Schizophrenia Mother     Social History Social History  Tobacco Use   Smoking status: Never   Smokeless tobacco: Never  Vaping Use   Vaping status: Never Used  Substance Use Topics   Alcohol use: No   Drug use: No     Allergies   Promethazine, Promethazine hcl, and Other   Review of Systems Review of Systems Per HPI  Physical Exam Triage Vital Signs ED Triage Vitals  Encounter Vitals Group     BP 10/05/22 1100 (!) 172/88     Systolic BP Percentile --      Diastolic BP Percentile --      Pulse Rate 10/05/22 1100 60     Resp 10/05/22 1100 16     Temp 10/05/22 1100 98 F (36.7 C)     Temp Source 10/05/22 1100 Oral     SpO2 10/05/22 1100 99 %     Weight --      Height --      Head Circumference --      Peak Flow --      Pain Score 10/05/22 1102 0     Pain Loc --      Pain Education --      Exclude from Growth Chart --    No data found.  Updated Vital Signs BP (!) 172/88 (BP Location: Right Arm)   Pulse 60   Temp 98 F (36.7 C) (Oral)   Resp 16   SpO2 99%   Visual Acuity Right Eye Distance:   Left Eye Distance:   Bilateral Distance:    Right Eye Near:   Left Eye Near:    Bilateral Near:     Physical Exam Vitals and nursing note reviewed.  Constitutional:      General: He is not in acute distress.    Appearance: Normal appearance.  HENT:     Head: Normocephalic.     Right Ear: Tympanic membrane, ear canal and external ear normal.     Left Ear: Tympanic membrane, ear canal and external ear normal.     Nose: Congestion present.     Mouth/Throat:     Mouth: Mucous membranes are moist.  Eyes:     Extraocular  Movements: Extraocular movements intact.     Conjunctiva/sclera: Conjunctivae normal.     Pupils: Pupils are equal, round, and reactive to light.  Cardiovascular:     Rate and Rhythm: Normal rate and regular rhythm.     Pulses: Normal pulses.     Heart sounds: Normal heart sounds.  Pulmonary:     Effort: Pulmonary effort is normal. No respiratory distress.     Breath sounds: Normal breath sounds. No stridor. No wheezing, rhonchi or rales.  Abdominal:     General: Bowel sounds are normal.     Palpations: Abdomen is soft.     Tenderness: There is no abdominal tenderness.  Musculoskeletal:     Cervical back: Normal range of motion.  Lymphadenopathy:     Cervical: No cervical adenopathy.  Skin:    General: Skin is warm and dry.  Neurological:     General: No focal deficit present.     Mental Status: He is alert and oriented to person, place, and time.  Psychiatric:        Mood and Affect: Mood normal.        Behavior: Behavior normal.      UC Treatments / Results  Labs (all labs ordered are listed, but only abnormal results are displayed) Labs Reviewed - No data to display  EKG  Radiology No results found.  Procedures Procedures (including critical care time)  Medications Ordered in UC Medications - No data to display  Initial Impression / Assessment and Plan / UC Course  I have reviewed the triage vital signs and the nursing notes.  Pertinent labs & imaging results that were available during my care of the patient were reviewed by me and considered in my medical decision making (see chart for details).  Patient presents for complaints of upper respiratory symptoms that been present for the past week.  On exam, patient is hypertensive, vital signs are otherwise stable.  Lung sounds are clear throughout.  Viral testing is not indicated as symptoms have been present for 1 week.  Will treat empirically given the patient's history of immunosuppression with Augmentin  875/125 mg tablets, fluticasone 50 mcg nasal spray, and Robitussin 100 mg.  For the patient's hemorrhoid flare, hydrocortisone 2.5% rectal cream was prescribed.  Supportive care recommendations were provided and discussed with the patient's spouse for his upper respiratory symptoms and hemorrhoids to include use of normal saline nasal spray throughout the day to help with cough, use of a humidifier in his bedroom at nighttime during sleep, and over-the-counter analgesics such as Tylenol for pain or discomfort.  For his hemorrhoids, recommend taking a stool softener daily, fluids according to fluid restrictions per his doctor, and warm Epsom salt soaks.  Patient spouse was advised if symptoms worsen or fail to improve, recommended the patient follow-up with his primary care physician for further evaluation.  Patient spouse verbalizes understanding.  All questions were answered.  Patient stable for discharge.   Final Clinical Impressions(s) / UC Diagnoses   Final diagnoses:  Acute upper respiratory infection  Hemorrhoids, unspecified hemorrhoid type     Discharge Instructions      For your upper respiratory symptoms: Take medication as prescribed. Drink fluid according to fluid restrictions prescribed by your doctor, and allow for plenty of rest. Recommend over-the-counter Tylenol as needed for pain, fever, or general discomfort.  You may take Tylenol arthritis strength 650 mg tablets if needed. May use normal saline nasal spray throughout the day to help with nasal congestion and runny nose. Recommend using a humidifier in your bedroom at nighttime during sleep and sleeping elevated on pillows while cough symptoms persist.  For your hemorrhoids: Use medication as prescribed. Recommend using a stool softener such as over-the-counter senna to help with bowel movements during hemorrhoid flare. Drink fluid according to fluid restrictions prescribed by your doctor, and allow for plenty of  rest. Warm Epsom salt soaks as tolerated to help with rectal pain or discomfort. If symptoms or not improving with this treatment, please follow-up with your primary care physician for further evaluation.  Follow-up as needed.     ED Prescriptions     Medication Sig Dispense Auth. Provider   guaiFENesin (ROBITUSSIN) 100 MG/5ML liquid Take 10 mLs by mouth every 8 (eight) hours as needed for up to 10 days for cough or to loosen phlegm. 300 mL -Warren, Sadie Haber, NP   amoxicillin-clavulanate (AUGMENTIN) 875-125 MG tablet Take 1 tablet by mouth every 12 (twelve) hours for 5 days. 10 tablet -Warren, Sadie Haber, NP   fluticasone (FLONASE) 50 MCG/ACT nasal spray Place 2 sprays into both nostrils daily. 16 g -Warren, Sadie Haber, NP   hydrocortisone (ANUSOL-HC) 2.5 % rectal cream Place 1 Application rectally 2 (two) times daily. 30 g -Warren, Sadie Haber, NP      PDMP not reviewed this encounter.  Abran Cantor, NP 10/05/22 1143

## 2022-10-07 NOTE — Progress Notes (Addendum)
Office Visit    Patient Name: [name]. Date of Encounter: 10/08/2022  Primary Care Provider:  [name], FNP Primary Cardiologist:  Lance Muss, MD Primary Electrophysiologist: None   Past Medical History    Past Medical History:  Diagnosis Date   Acute bilateral cerebral infarction in a watershed distribution [facility]) 04/21/2021   a.) MRI brain 04/21/2021 --> patchy ACUTE cortically-based infarcts within the bilateral high frontal lobes and right parietal lobe   Acute cerebral infarction (HCC) 02/13/2021   a.) MRI brain 02/13/2021: acute LEFT hippocampal infarct   Acute cerebral infarction (HCC) 04/21/2021   a.) MRI brain 04/21/2021: subcentimeter ACUTE infarct within the callosal splenium on the LEFT   Anemia of chronic renal failure    Anxiety    Aortic dilatation (HCC) 11/25/2020   a.) TTE 11/25/2020: Ao root measured 38 mm. b.) TTE 04/22/2021: Ao root measured 44 mm; c. 11/2021 Echo: Ao root 40mm.   Atrial fibrillation (HCC)    a.) CHA2DS2-VASc = 6 (CHF, HTN, CVA x2, prior MI, T2DM). b.) rate/rhythm maintained on oral amiodarone + carvedilol; chronic OAC/AP therapy discontinued following ICH   BPH (benign prostatic hyperplasia)    Cardiac arrest (HCC) 07/26/2021   a.) in setting of hyperkalemia, NSTEMI, and seizure following missed HD; pulseless and apneic --> required 1 round of CPR prior to ROSC   Cellulitis    Cerebral hemorrhage (HCC) 11/21/2021   a.) CT head 11/21/2021 --> 1.4 x 1.5 x 3.1 cm ACUTE hemorrhage within the inferior aspect of the fourth ventricle, and extending inferiorly through the foramen of foramen of [place] --> occurred in setting of HTN emergency and DOAC/APT-->eliquis/plavix d/c'd.   Cerebral microvascular disease    Chronic diarrhea    Chronic heart failure with preserved ejection fraction (HFpEF) (HCC) 07/19/2017   a.) 06/2017 Echo: EF 75%; b.) 11/2020 Echo: EF 55-60%; c.) 04/2021 Echo: EF 55-60%; d.) 11/2021 Echo: EF 65-70%,  GrII DD, nl RV fxn, sev dil LA, large circumferential pericardial eff w/o tamponade, triv MR, AoV sclerosis. Ao root 40mm; e.) TTE 02/28/2025: EF 60-65%, sev LVH, mod posterior LV effusion, AoV sclerosis   Chronic pain of both ankles    Coronary artery disease 02/26/2020   a. 02/2020 Ant STEMI/PCI: LM nl, LAD 64m (3.5x30 Resolute Onyx), RI nl, LCX min irregs, RCA min irregs. EF 65%.   DDD (degenerative disc disease), cervical    Diabetes mellitus, type II (HCC)    Diabetic neuropathy (HCC)    Esophagitis    ESRD (end stage renal disease) on dialysis (HCC)    a.) [facility]; T-Th-Sat   Frequent falls    Gait instability    Gastritis    Gastroparesis    GERD (gastroesophageal reflux disease)    H/O enucleation of left eyeball    Heart palpitations    History of kidney stones    HLD (hyperlipidemia)    Hypertension    Insomnia    a.) uses melatonin PRN   Long term current use of amiodarone    Nausea and vomiting in adult    recurrent   NSTEMI (non-ST elevated myocardial infarction) (HCC) 07/26/2021   a.) hyperkalemic from missed HD; seizures; decompensated to cardiac arrest and required CPR x 1 round prior to ROSC.   Osteomyelitis (HCC)    Pericardial effusion    a. 11/2021 Echo: EF 65-70%, GrII DD, nl RV fxn, sev dil LA, large circumferential pericardial eff w/o tamponade, triv MR, AoV sclerosis. Ao root 40mm.  Seizure (HCC)    a.) last 07/26/2021 in setting of missed HD --> hyperkalemic at 6.5 --> pulseless/apneic and required CPR; discharged home on levetiracetam.   ST elevation myocardial infarction (STEMI) of anterior wall (HCC) 02/26/2020   a.) LHC/PCI 02/26/2020 --> EF 65%; LVEDP 11 mmHg; 90% mLAD (3.5 x 20 mm Resolute Onyx DES x 1)   Past Surgical History:  Procedure Laterality Date   A/V FISTULAGRAM Left 08/31/2021   Procedure: A/V Fistulagram;  Surgeon: Annice Needy, MD;  Location: [name] INVASIVE CV LAB;  Service: Cardiovascular;  Laterality: Left;   A/V FISTULAGRAM Left  01/25/2022   Procedure: A/V Fistulagram;  Surgeon: Annice Needy, MD;  Location: [name] INVASIVE CV LAB;  Service: Cardiovascular;  Laterality: Left;   AMPUTATION Left 03/16/2016   Procedure: AMPUTATION DIGIT LEFT HALLUX;  Surgeon: Vivi Barrack, DPM;  Location: [facility] OR;  Service: Podiatry;  Laterality: Left;  can start around 5    AMPUTATION Right 02/09/2021   Procedure: AMPUTATION DIGIT;  Surgeon: Marlyne Beards, MD;  Location: [facility] OR;  Service: Orthopedics;  Laterality: Right;   AMPUTATION TOE Right 02/28/2022   Procedure: AMPUTATION TOE;  Surgeon: Louann Sjogren, DPM;  Location: [name] ORS;  Service: Podiatry;  Laterality: Right;  right fifth toe   ARTHROSCOPIC REPAIR ACL Left    AV FISTULA PLACEMENT Right 05/31/2019   Procedure: Brachiocephalic AV fistula creation;  Surgeon: Annice Needy, MD;  Location: [name] ORS;  Service: Vascular;  Laterality: Right;   AV FISTULA PLACEMENT Left 08/13/2021   Procedure: ARTERIOVENOUS (AV) FISTULA CREATION (BRACHIALCEPHALIC);  Surgeon: Annice Needy, MD;  Location: [name] ORS;  Service: Vascular;  Laterality: Left;   COLONOSCOPY WITH PROPOFOL N/A 10/28/2015   Procedure: COLONOSCOPY WITH PROPOFOL;  Surgeon: Christena Deem, MD;  Location: [facility] ENDOSCOPY;  Service: Endoscopy;  Laterality: N/A;   COLONOSCOPY WITH PROPOFOL N/A 10/29/2015   Procedure: COLONOSCOPY WITH PROPOFOL;  Surgeon: Christena Deem, MD;  Location: [facility] ENDOSCOPY;  Service: Endoscopy;  Laterality: N/A;   CORONARY ULTRASOUND/IVUS N/A 02/26/2020   Procedure: Intravascular Ultrasound/IVUS;  Surgeon: Corky Crafts, MD;  Location: [facility] INVASIVE CV LAB;  Service: Cardiovascular;  Laterality: N/A;   CORONARY/GRAFT ACUTE MI REVASCULARIZATION N/A 02/26/2020   Procedure: Coronary/Graft Acute MI Revascularization;  Surgeon: Corky Crafts, MD;  Location: [facility] INVASIVE CV LAB;  Service: Cardiovascular;  Laterality: N/A;   DIALYSIS/PERMA CATHETER INSERTION Right 04/26/2019   Perm Cath     DIALYSIS/PERMA CATHETER INSERTION N/A 04/26/2019   Procedure: DIALYSIS/PERMA CATHETER INSERTION;  Surgeon: Annice Needy, MD;  Location: [name] INVASIVE CV LAB;  Service: Cardiovascular;  Laterality: N/A;   DIALYSIS/PERMA CATHETER INSERTION N/A 05/25/2021   Procedure: DIALYSIS/PERMA CATHETER INSERTION;  Surgeon: Annice Needy, MD;  Location: [name] INVASIVE CV LAB;  Service: Cardiovascular;  Laterality: N/A;   DIALYSIS/PERMA CATHETER INSERTION N/A 08/31/2021   Procedure: DIALYSIS/PERMA CATHETER INSERTION;  Surgeon: Annice Needy, MD;  Location: [name] INVASIVE CV LAB;  Service: Cardiovascular;  Laterality: N/A;   DIALYSIS/PERMA CATHETER REMOVAL N/A 08/15/2019   Procedure: DIALYSIS/PERMA CATHETER REMOVAL;  Surgeon: Renford Dills, MD;  Location: [name] INVASIVE CV LAB;  Service: Cardiovascular;  Laterality: N/A;   DIALYSIS/PERMA CATHETER REMOVAL N/A 03/15/2022   Procedure: DIALYSIS/PERMA CATHETER REMOVAL;  Surgeon: Annice Needy, MD;  Location: [name] INVASIVE CV LAB;  Service: Cardiovascular;  Laterality: N/A;   EMBOLIZATION (CATH LAB) Right 02/06/2021   Procedure: EMBOLIZATION;  Surgeon: Annice Needy, MD;  Location: [name] INVASIVE CV LAB;  Service: Cardiovascular;  Laterality: Right;  Right Upper Extremity Dialysis Access, Permcath Placement   ESOPHAGOGASTRODUODENOSCOPY (EGD) WITH PROPOFOL N/A 12/27/2017   Procedure: ESOPHAGOGASTRODUODENOSCOPY (EGD) WITH PROPOFOL;  Surgeon: Toledo, Boykin Nearing, MD;  Location: [name] ENDOSCOPY;  Service: Gastroenterology;  Laterality: N/A;   EYE SURGERY     LEFT HEART CATH AND CORONARY ANGIOGRAPHY N/A 02/26/2020   Procedure: LEFT HEART CATH AND CORONARY ANGIOGRAPHY;  Surgeon: Corky Crafts, MD;  Location: [facility] INVASIVE CV LAB;  Service: Cardiovascular;  Laterality: N/A;   PROSTATE SURGERY  2016   REVISON OF ARTERIOVENOUS FISTULA Right 07/22/2022   Procedure: RESECTION OF ANEURYSMAL RIGHT ARM ARTERIOVENOUS FISTULA;  Surgeon: Annice Needy, MD;  Location: [name] ORS;  Service:  Vascular;  Laterality: Right;   TONSILECTOMY/ADENOIDECTOMY WITH MYRINGOTOMY     TONSILLECTOMY     UPPER EXTREMITY ANGIOGRAPHY Right 11/26/2020   Procedure: Upper Extremity Angiography;  Surgeon: Annice Needy, MD;  Location: [name] INVASIVE CV LAB;  Service: Cardiovascular;  Laterality: Right;   UPPER EXTREMITY ANGIOGRAPHY Right 02/05/2021   Procedure: UPPER EXTREMITY ANGIOGRAPHY;  Surgeon: Annice Needy, MD;  Location: [name] INVASIVE CV LAB;  Service: Cardiovascular;  Laterality: Right;    Allergies  Allergies  Allergen Reactions   Promethazine Diarrhea and Other (See Comments)    Muscle cramps   Promethazine Hcl Other (See Comments)    Cramping all over  Other reaction(s): cramp all over   Other     Other reaction(s): Unknown Other reaction(s): Unknown     History of Present Illness    [name] Aspyn Schrag.  is a 52 year old male with a PMH of CAD s/p anterior STEMI with DES to mid LAD 02/2020, paroxysmal AF (not on AC) ESRD, HTN, HLD, gastroparesis, diastolic dysfunction, CVA 01/2021, GERD, dry gangrene, chronic anemia, medical noncompliance who presents today for 65-month follow-up.  Mr. Biderman has been followed by Dr. Eldridge Dace since 02/2020 presented with an anterior STEMI and underwent PCI with DES to mid LAD.  He developed AF and was treated with amiodarone and Eliquis.  He suffered a CVA and 01/2021 after holding [facility] for patient procedure. In 07/2021 he suffered seizure and cardiac arrest after developing hyperkalemia due to missing HD session that required brief CPR.  In 07/2021 he suffered a seizure and cardiac arrest.  He was started on Keppra 2D echo was completed  with EF of 60-65%  with large circumferential pericardial effusion without tamponade.  Eliquis and clopidogrel stopped 10//23 due to ICH.  He recently underwent AV fistula revision and was last seen by Dr. Eldridge Dace on 03/24/2022 with no complaints  of angina.  He was tolerating dialysis without hypotension he was staying  controlled during sessions. Guidance for restarting Plavix  and Eliquis should come from neurology.  Since last being seen in the office patient reports that he has been experiencing hypotension during dialysis as well as spikes during his session.  He has gastroparesis and experiences severe diarrhea and does not eat anything before his dialysis session.  He is also suffering from severe hemorrhoids and will be following up with gastroenterology later this month.  We discussed possible supplementation with Ensure or boost but patient has really bad lactose intolerance and is unable to drink dairy.  His blood pressure today was controlled at 122/70 and heart rate was 55 bpm.  He is also having a severe cough that is currently being treated with antibiotics.  We discussed possible dietary adjustments and adding foods that are slowly digestible and will not promote  fast gastric emptying.  Patient denies chest pain, palpitations, dyspnea, PND, orthopnea, nausea, vomiting, dizziness, syncope, edema, weight gain, or early satiety.   Home Medications    Current Outpatient Medications  Medication Sig Dispense Refill   acetaminophen (TYLENOL) 325 MG tablet Take 1-2 tablets (325-650 mg total) by mouth every 4 (four) hours as needed for mild pain.     amiodarone (PACERONE) 200 MG tablet Take 1 tablet (200 mg total) by mouth every morning. 90 tablet 2   amLODipine (NORVASC) 10 MG tablet Take 1 tablet (10 mg total) by mouth daily. (Patient taking differently: Take 5 mg by mouth daily.) 30 tablet 2   ammonium lactate (AMLACTIN) 12 % cream Apply 1 Application topically as needed for dry skin. 385 g 0   atorvastatin (LIPITOR) 80 MG tablet TAKE ONE TABLET BY MOUTH AT BEDTIME. 90 tablet 3   calcium acetate (PHOSLO) 667 MG capsule Take 2 capsules (1,334 mg total) by mouth 3 (three) times daily with meals. 180 capsule 0   carvedilol (COREG) 25 MG tablet Take 25 mg by mouth 2 (two) times daily.     cloNIDine (CATAPRES)  0.3 MG tablet Take 1 tablet (0.3 mg total) by mouth every 8 (eight) hours. 90 tablet 2   fluticasone (FLONASE) 50 MCG/ACT nasal spray Place 2 sprays into both nostrils daily. 16 g 0   guaiFENesin (ROBITUSSIN) 100 MG/5ML liquid Take 10 mLs by mouth every 8 (eight) hours as needed for up to 10 days for cough or to loosen phlegm. 300 mL 0   hydrALAZINE (APRESOLINE) 25 MG tablet Take 25 mg by mouth 3 (three) times daily.     HYDROcodone-acetaminophen (NORCO/VICODIN) 5-325 MG tablet Take 2 tablets by mouth every 6 (six) hours as needed for moderate pain. 20 tablet 0   hydrocortisone (ANUSOL-HC) 2.5 % rectal cream Place 1 Application rectally 2 (two) times daily. 30 g 0   levETIRAcetam (KEPPRA) 250 MG tablet Take 1 tablet (250 mg total) by mouth Every Tuesday,Thursday,and Saturday with dialysis. 15 tablet 1   levETIRAcetam (KEPPRA) 500 MG tablet Take 1 tablet (500 mg total) by mouth daily after breakfast. 30 tablet 1   lidocaine-prilocaine (EMLA) cream Apply topically.     nitroGLYCERIN (NITROSTAT) 0.4 MG SL tablet Place 1 tablet (0.4 mg total) under the tongue every 5 (five) minutes as needed for chest pain. 25 tablet 6   No current facility-administered medications for this visit.     Review of Systems  Please see the history of present illness.    (+) Hypotension, (+) Chronic diarrhea and hemorrhoid pain  All other systems reviewed and are otherwise negative except as noted above.  Physical Exam    Wt Readings from Last 3 Encounters:  10/08/22 186 lb 9.6 oz (84.6 kg)  08/20/22 180 lb (81.6 kg)  08/04/22 180 lb (81.6 kg)   VS: Vitals:   10/08/22 1012  BP: 122/70  Pulse: (!) 55  SpO2: 99%  ,Body mass index is 23.32 kg/m.  Constitutional:      Appearance: Healthy appearance. Not in distress.  Neck:     Vascular: JVD normal.  Pulmonary:     Effort: Pulmonary effort is normal.     Breath sounds: No wheezing. No rales. Diminished in the bases Cardiovascular:     Normal rate.  Regular rhythm. Normal S1. Normal S2.      Murmurs: There is no murmur.  Edema:    Peripheral edema absent.  Abdominal:     Palpations: Abdomen is  soft non tender. There is no hepatomegaly.  Skin:    General: Skin is warm and dry.  Neurological:     General: No focal deficit present.     Mental Status: Alert and oriented to person, place and time.     Cranial Nerves: Cranial nerves are intact.  EKG/LABS/ Recent Cardiac Studies    ECG personally reviewed by me today -none completed today   CHA2DS2-VASc Score = 6   This indicates a 9.7% annual risk of stroke. The patient's score is based upon: CHF History: 1 HTN History: 1 Diabetes History: 1 Stroke History: 2 Vascular Disease History: 1 Age Score: 0 Gender Score: 0       Lab Results  Component Value Date   WBC 6.4 07/22/2022   HGB 12.6 (L) 07/22/2022   HCT 37.0 (L) 07/22/2022   MCV 73.4 (L) 07/22/2022   PLT 94 (L) 07/22/2022   Lab Results  Component Value Date   CREATININE 13.60 (H) 07/22/2022   BUN 50 (H) 07/22/2022   NA 141 07/22/2022   K 3.8 07/22/2022   CL 111 07/22/2022   CO2 19 (L) 07/22/2022   Lab Results  Component Value Date   ALT 12 06/08/2022   AST 9 (L) 06/08/2022   ALKPHOS 82 06/08/2022   BILITOT 0.9 06/08/2022   Lab Results  Component Value Date   CHOL 94 11/21/2021   HDL 39 (L) 11/21/2021   LDLCALC 34 11/21/2021   TRIG 124 11/26/2021   CHOLHDL 2.4 11/21/2021    Lab Results  Component Value Date   HGBA1C 4.5 (L) 11/21/2021     Assessment & Plan    1.  Coronary artery disease: -s/p rior MI with DES to LAD 02/2020. NSTEMI with seizure and following hypokalemia that required 1 round of CPR with return of ROSC  -Today patient reports that he is not experienced any chest pain anginal equivalent. -Continue carvedilol 25 mg twice daily, Lipitor 80 mg daily, Nitrostat 0.4 mg as needed -Repeat 2D echo to evaluate pericardial effusion  2.  Paroxysmal AF: -Patient currently on low-dose  amiodarone at 200 mg daily with controlled rate and no complaint of palpitations. -Continue Coreg 25 mg twice daily -CHA2DS2-VASc Score = 6 [CHF History: 1, HTN History: 1, Diabetes History: 1, Stroke History: 2, Vascular Disease History: 1, Age Score: 0, Gender Score: 0].  Therefore, the patient's annual risk of stroke is 9.7 %.      3.  Essential hypertension: -Patient's blood pressure today was controlled at 122/70 -Patient reports hypotension during dialysis and was encouraged to hold amlodipine, carvedilol, and hydralazine prior to his dialysis sessions. -Continue medications postdialysis session follow-up regular dosing schedule on nondialysis days.  4.  History of CVA/ICH: -History of multiple CVAs with most recent 01/2021 while holding OAC. -Continue GDMT with Lipitor 80 mg daily carvedilol 25 mg twice daily  5.  ESRD: -HD on Tu/Thu/Sat  Continue to follow nephrology and patient advised to hold above-mentioned medications prior to dialysis session to offset hypotension. -Patient will also try consuming more fiber and low-carb heart healthy snack before and during session.     Disposition: Follow-up with Lance Muss, MD or APP in 3 months    Medication Adjustments/Labs and Tests Ordered: Current medicines are reviewed at length with the patient today.  Concerns regarding medicines are outlined above.   Signed, Napoleon Form, Leodis Rains, NP 10/08/2022, 12:09 PM [facility]

## 2022-10-08 ENCOUNTER — Encounter (INDEPENDENT_AMBULATORY_CARE_PROVIDER_SITE_OTHER): Payer: Self-pay | Admitting: *Deleted

## 2022-10-08 ENCOUNTER — Ambulatory Visit: Admission: RE | Admit: 2022-10-08 | Payer: Medicare HMO | Source: Ambulatory Visit

## 2022-10-08 ENCOUNTER — Encounter: Payer: Self-pay | Admitting: Nurse Practitioner

## 2022-10-08 ENCOUNTER — Ambulatory Visit: Payer: Medicare HMO | Admitting: Nurse Practitioner

## 2022-10-08 VITALS — BP 122/70 | HR 55 | Ht 75.0 in | Wt 186.6 lb

## 2022-10-08 DIAGNOSIS — N186 End stage renal disease: Secondary | ICD-10-CM

## 2022-10-08 DIAGNOSIS — I10 Essential (primary) hypertension: Secondary | ICD-10-CM

## 2022-10-08 DIAGNOSIS — I4891 Unspecified atrial fibrillation: Secondary | ICD-10-CM

## 2022-10-08 DIAGNOSIS — Z992 Dependence on renal dialysis: Secondary | ICD-10-CM

## 2022-10-08 DIAGNOSIS — I25118 Atherosclerotic heart disease of native coronary artery with other forms of angina pectoris: Secondary | ICD-10-CM

## 2022-10-08 DIAGNOSIS — I639 Cerebral infarction, unspecified: Secondary | ICD-10-CM

## 2022-10-08 NOTE — Patient Instructions (Addendum)
Medication Instructions:  HOLD BEFORE DIALYSIS: Amlodipine, Coreg, Hydralazine TAKE AFTER DIALYSIS: Amlodipine and Keppra *If you need a refill on your cardiac medications before your next appointment, please call your pharmacy*   Lab Work: None ordered   Testing/Procedures: Your physician has requested that you have an echocardiogram. Echocardiography is a painless test that uses sound waves to create images of your heart. It provides your doctor with information about the size and shape of your heart and how well your heart's chambers and valves are working. This procedure takes approximately one hour. There are no restrictions for this procedure. Please do NOT wear cologne, perfume, aftershave, or lotions (deodorant is allowed). Please arrive 15 minutes prior to your appointment time.  A chest x-ray takes a picture of the organs and structures inside the chest, including the heart, lungs, and blood vessels. This test can show several things, including, whether the heart is enlarges; whether fluid is building up in the lungs; and whether pacemaker / defibrillator leads are still in place. THIS TEST WILL BE PERFORMED AT [address] [facility] ([facility])  PFTs at pulmonology   Follow-Up: At [facility], you and your health needs are our priority.  As part of our continuing mission to provide you with exceptional heart care, we have created designated Provider Care Teams.  These Care Teams include your primary Cardiologist (physician) and Advanced Practice Providers (APPs -  Physician Assistants and Nurse Practitioners) who all work together to provide you with the care you need, when you need it.  We recommend signing up for the patient portal called "MyChart".  Sign up information is provided on this After Visit Summary.  MyChart is used to connect with patients for Virtual Visits (Telemedicine).  Patients are able to view lab/test results, encounter notes, upcoming  appointments, etc.  Non-urgent messages can be sent to your provider as well.   To learn more about what you can do with MyChart, go to [email or web address].    Your next appointment:   3 month(s)  Provider:   Robin Searing, NP      Other Instructions

## 2022-10-13 ENCOUNTER — Telehealth: Payer: Self-pay | Admitting: Interventional Cardiology

## 2022-10-13 NOTE — Telephone Encounter (Signed)
Pt calling to f/u in CT results. Please advise

## 2022-10-13 NOTE — Telephone Encounter (Signed)
Gaston Islam., NP 10/08/2022  4:44 PM EDT     Please let patient know that his chest- Xray was normal with no acute changes   Plan: Please continue your antibiotic and contact your PCP for further questions.     Robin Searing, NP    The patient has been notified of the result and verbalized understanding.  All questions (if any) were answered.  Advised the pt to contact his PCP for further questions or ongoing URI symptoms.  Pt verbalized understanding and agrees with this plan.  Will route a copy of this report to the pts PCP on file.

## 2022-10-14 ENCOUNTER — Telehealth (INDEPENDENT_AMBULATORY_CARE_PROVIDER_SITE_OTHER): Payer: Self-pay

## 2022-10-14 NOTE — Telephone Encounter (Signed)
Spoke with the patient and he is scheduled with Dr. Wyn Quaker on 10/18/22 with a 11:30 am arrival time to the [facility]. Pre-procedure instructions were discussed and will be sent to Mychart.

## 2022-10-18 ENCOUNTER — Other Ambulatory Visit: Payer: Self-pay

## 2022-10-18 ENCOUNTER — Encounter: Payer: Self-pay | Admitting: Vascular Surgery

## 2022-10-18 ENCOUNTER — Ambulatory Visit
Admission: RE | Admit: 2022-10-18 | Discharge: 2022-10-18 | Disposition: A | Discharge status: Home / Self Care | Payer: Medicare HMO | Attending: Vascular Surgery | Admitting: Vascular Surgery

## 2022-10-18 ENCOUNTER — Encounter
Admission: RE | Disposition: A | Discharge status: Home / Self Care | Payer: Self-pay | Source: Home / Self Care | Attending: Vascular Surgery

## 2022-10-18 DIAGNOSIS — Z992 Dependence on renal dialysis: Secondary | ICD-10-CM | POA: Diagnosis not present

## 2022-10-18 DIAGNOSIS — T82898A Other specified complication of vascular prosthetic devices, implants and grafts, initial encounter: Secondary | ICD-10-CM | POA: Diagnosis not present

## 2022-10-18 DIAGNOSIS — I2510 Atherosclerotic heart disease of native coronary artery without angina pectoris: Secondary | ICD-10-CM | POA: Diagnosis not present

## 2022-10-18 DIAGNOSIS — T82858A Stenosis of vascular prosthetic devices, implants and grafts, initial encounter: Secondary | ICD-10-CM | POA: Insufficient documentation

## 2022-10-18 DIAGNOSIS — I132 Hypertensive heart and chronic kidney disease with heart failure and with stage 5 chronic kidney disease, or end stage renal disease: Secondary | ICD-10-CM | POA: Insufficient documentation

## 2022-10-18 DIAGNOSIS — I5032 Chronic diastolic (congestive) heart failure: Secondary | ICD-10-CM | POA: Diagnosis not present

## 2022-10-18 DIAGNOSIS — E1122 Type 2 diabetes mellitus with diabetic chronic kidney disease: Secondary | ICD-10-CM | POA: Insufficient documentation

## 2022-10-18 DIAGNOSIS — N186 End stage renal disease: Secondary | ICD-10-CM | POA: Diagnosis not present

## 2022-10-18 DIAGNOSIS — Y841 Kidney dialysis as the cause of abnormal reaction of the patient, or of later complication, without mention of misadventure at the time of the procedure: Secondary | ICD-10-CM | POA: Insufficient documentation

## 2022-10-18 HISTORY — PX: A/V FISTULAGRAM: CATH118298

## 2022-10-18 LAB — POTASSIUM (ARMC VASCULAR LAB ONLY): Potassium (ARMC vascular lab): 4.5 mmol/L (ref 3.5–5.1)

## 2022-10-18 SURGERY — A/V FISTULAGRAM
Anesthesia: Moderate Sedation | Laterality: Right

## 2022-10-18 MED ORDER — SODIUM CHLORIDE 0.9 % IV SOLN: INTRAVENOUS | Status: DC

## 2022-10-18 MED ORDER — HEPARIN SODIUM (PORCINE) 1000 UNIT/ML IJ SOLN
INTRAMUSCULAR | Status: DC | PRN
  Administered 2022-10-18: 3000 [IU] via INTRAVENOUS

## 2022-10-18 MED ORDER — IODIXANOL 320 MG/ML IV SOLN
INTRAVENOUS | Status: DC | PRN
  Administered 2022-10-18: 30 mL via INTRAVENOUS

## 2022-10-18 MED ORDER — HEPARIN (PORCINE) IN NACL 1000-0.9 UT/500ML-% IV SOLN
INTRAVENOUS | Status: DC | PRN
  Administered 2022-10-18: 1000 mL

## 2022-10-18 MED ORDER — CEFAZOLIN SODIUM-DEXTROSE 1-4 GM/50ML-% IV SOLN
1.0000 g | INTRAVENOUS | Status: AC
  Administered 2022-10-18: 1 g via INTRAVENOUS

## 2022-10-18 MED ORDER — MIDAZOLAM HCL 2 MG/ML PO SYRP: 8.0000 mg | ORAL_SOLUTION | Freq: Once | ORAL | Status: DC | PRN

## 2022-10-18 MED ORDER — METHYLPREDNISOLONE SODIUM SUCC 125 MG IJ SOLR: 125.0000 mg | Freq: Once | INTRAMUSCULAR | Status: DC | PRN

## 2022-10-18 MED ORDER — LIDOCAINE-EPINEPHRINE (PF) 1 %-1:200000 IJ SOLN
INTRAMUSCULAR | Status: DC | PRN
  Administered 2022-10-18: 5 mL via INTRADERMAL

## 2022-10-18 MED ORDER — FENTANYL CITRATE (PF) 100 MCG/2ML IJ SOLN
INTRAMUSCULAR | Status: DC | PRN
  Administered 2022-10-18 (×2): 50 ug via INTRAVENOUS

## 2022-10-18 MED ORDER — DIPHENHYDRAMINE HCL 50 MG/ML IJ SOLN: 50.0000 mg | Freq: Once | INTRAMUSCULAR | Status: DC | PRN

## 2022-10-18 MED ORDER — MIDAZOLAM HCL 2 MG/2ML IJ SOLN
INTRAMUSCULAR | Status: DC | PRN
  Administered 2022-10-18: 2 mg via INTRAVENOUS

## 2022-10-18 MED ORDER — HYDROMORPHONE HCL 1 MG/ML IJ SOLN: 1.0000 mg | Freq: Once | INTRAMUSCULAR | Status: DC | PRN

## 2022-10-18 MED ORDER — CEFAZOLIN SODIUM-DEXTROSE 1-4 GM/50ML-% IV SOLN
INTRAVENOUS | Status: AC
  Filled 2022-10-18: qty 50

## 2022-10-18 MED ORDER — HEPARIN SODIUM (PORCINE) 1000 UNIT/ML IJ SOLN
INTRAMUSCULAR | Status: AC
  Filled 2022-10-18: qty 10

## 2022-10-18 MED ORDER — MIDAZOLAM HCL 2 MG/2ML IJ SOLN
INTRAMUSCULAR | Status: AC
  Filled 2022-10-18: qty 2

## 2022-10-18 MED ORDER — FAMOTIDINE 20 MG PO TABS: 40.0000 mg | ORAL_TABLET | Freq: Once | ORAL | Status: DC | PRN

## 2022-10-18 MED ORDER — FENTANYL CITRATE (PF) 100 MCG/2ML IJ SOLN
INTRAMUSCULAR | Status: AC
  Filled 2022-10-18: qty 2

## 2022-10-18 SURGICAL SUPPLY — 17 items
BALLN ARMADA 12X60X80 (BALLOONS) ×1
BALLN LUTONIX 7X100X130 (BALLOONS) ×1
BALLN LUTONIX AV 7X60X75 (BALLOONS) ×1
BALLOON ARMADA 12X60X80 (BALLOONS) IMPLANT
BALLOON LUTONIX 7X100X130 (BALLOONS) IMPLANT
BALLOON LUTONIX AV 7X60X75 (BALLOONS) IMPLANT
BNDG CMPR 5X4 CHSV STRCH STRL (GAUZE/BANDAGES/DRESSINGS) ×1
BNDG COHESIVE 4X5 TAN STRL (GAUZE/BANDAGES/DRESSINGS) IMPLANT
BNDG COHESIVE 4X5 TAN STRL LF (GAUZE/BANDAGES/DRESSINGS) IMPLANT
CANNULA 5F STIFF (CANNULA) IMPLANT
COVER PROBE ULTRASOUND 5X96 (MISCELLANEOUS) IMPLANT
DRAPE BRACHIAL (DRAPES) IMPLANT
KIT ENCORE 26 ADVANTAGE (KITS) IMPLANT
PACK ANGIOGRAPHY (CUSTOM PROCEDURE TRAY) ×1 IMPLANT
SHEATH BRITE TIP 6FRX5.5 (SHEATH) IMPLANT
SUT MNCRL AB 4-0 PS2 18 (SUTURE) IMPLANT
WIRE SUPRACORE 190CM (WIRE) IMPLANT

## 2022-10-18 NOTE — Op Note (Signed)
[facility]    OPERATIVE NOTE   PROCEDURE: 1.   Left brachiocephalic arteriovenous fistula cannulation under ultrasound guidance 2.   Left arm fistulagram including central venogram 3.   Percutaneous transluminal angioplasty of the left cephalic vein subclavian vein confluence with 7 mm diameter by 6 cm length Lutonix drug-coated angioplasty balloon 4.  Percutaneous transluminal angioplasty of left innominate vein with 12 mm diameter by 6 cm length angioplasty balloon 5.  Percutaneous transluminal angioplasty of the mid to distal upper arm cephalic vein with 7 mm diameter by 10 cm length Lutonix drug-coated angioplasty balloon  PRE-OPERATIVE DIAGNOSIS: 1. ESRD 2. Poorly functional left brachiocephalic AVF  POST-OPERATIVE DIAGNOSIS: same as above   SURGEON: Festus Barren, MD  ANESTHESIA: local with MCS  ESTIMATED BLOOD LOSS: 5 cc  FINDING(S): Narrowing creating 60 to 70% stenosis in the cephalic vein in the mid to distal upper arm beyond the access sites and between access sites with pseudoaneurysms at both the arterial and venous access sites.  The proximal upper arm cephalic vein was fairly large and there was then about a 70% stenosis of the cephalic vein subclavian vein confluence.  Further down, there was about an 80% stenosis in the left innominate vein  SPECIMEN(S):  None  CONTRAST: 30 cc  FLUORO TIME: 3.3 minutes  MODERATE CONSCIOUS SEDATION TIME: Approximately 37 minutes with 2 mg of Versed and 100 mcg of Fentanyl   INDICATIONS: [name]. is a 52 y.o. male who presents with malfunctioning left brachiocephalic arteriovenous fistula.  The patient is scheduled for left arm fistulagram.  The patient is aware the risks include but are not limited to: bleeding, infection, thrombosis of the cannulated access, and possible anaphylactic reaction to the contrast.  The patient is aware of the risks of the procedure and elects to proceed  forward.  DESCRIPTION: After full informed written consent was obtained, the patient was brought back to the angiography suite and placed supine upon the angiography table.  The patient was connected to monitoring equipment. Moderate conscious sedation was administered with a face to face encounter with the patient throughout the procedure with my supervision of the RN administering medicines and monitoring the patient's vital signs and mental status throughout from the start of the procedure until the patient was taken to the recovery room. The left arm was prepped and draped in the standard fashion for a percutaneous access intervention.  Under ultrasound guidance, the left brachiocephalic arteriovenous fistula was cannulated with a micropuncture needle under direct ultrasound guidance where it was patent and a permanent image was performed.  The microwire was advanced into the fistula and the needle was exchanged for the a microsheath.  I then upsized to a 6 Fr Sheath and imaging was performed.  Hand injections were completed to image the access including the central venous system. This demonstrated 60 to 70% stenosis in the cephalic vein in the mid to distal upper arm beyond the access sites and between access sites with pseudoaneurysms at both the arterial and venous access sites.  The proximal upper arm cephalic vein was fairly large and there was then about a 70% stenosis of the cephalic vein subclavian vein confluence.  Further down, there was about an 80% stenosis in the left innominate vein.  Based on the images, this patient will need intervention to multiple areas. I then gave the patient 3000 units of intravenous heparin.  I then crossed the stenoses with a Supracore wire.  Based on the  imaging, a 7 mm x 6 cm Lutonix drug-coated angioplasty balloon was selected for the cephalic vein subclavian confluence stenosis the balloon was centered around the cephalic vein subclavian vein confluence stenosis  and inflated to 12 ATM for 1 minute(s).  On completion imaging, a 25-30% residual stenosis was present.   I then upsized to a 12 mm diameter by 6 cm length angioplasty balloon to treat the left innominate vein.  This was inflated to 8 atm for 1 minute.  Completion imaging showed significant improvement with only about a 20 to 25% residual stenosis.  I then turned my attention to the mid to distal upper arm cephalic vein.  The areas of stenosis distal to the access sites and just proximal to the distal access site were treated together with a 7 mm diameter by 10 cm length Lutonix drug-coated angioplasty balloon inflated to 10 atm for 1 minute.  Completion imaging showed about a 30% residual stenosis in the mid to distal upper arm cephalic vein.  The pseudoaneurysms at the access sites remain, and if further problems develop we will need to place a large covered stent across the area to exclude the pseudoaneurysms and treat any recurrent stenosis.  This would be best done by accessing the proximal upper arm cephalic vein in a retrograde fashion.  This was not required today, but would be an option for the future if things worsen.   Based on the completion imaging, no further intervention is necessary.  The wire and balloon were removed from the sheath.  A 4-0 Monocryl purse-string suture was sewn around the sheath.  The sheath was removed while tying down the suture.  A sterile bandage was applied to the puncture site.  COMPLICATIONS: None  CONDITION: Stable   Festus Barren  10/18/2022 1:26 PM   This note was created with Dragon Medical transcription system. Any errors in dictation are purely unintentional.

## 2022-10-18 NOTE — H&P (Signed)
[facility] Admission History & Physical  MRN : [id number]  [name]. is a 52 y.o. ([birth year]) male who presents with chief complaint of No chief complaint on file. Marland Kitchen  History of Present Illness: I am asked to evaluate the patient by the dialysis center. The patient was sent here because they were unable to achieve adequate dialysis this morning. Furthermore the Center states there is very poor thrill and bruit. The patient states there there have been increasing problems with the access, such as "pulling clots" during dialysis and prolonged bleeding after decannulation. The patient estimates these problems have been going on for several weeks. The patient is unaware of any other change.   Patient denies pain or tenderness overlying the access.  There is no pain with dialysis.  The patient denies hand pain or finger pain consistent with steal syndrome.    There have been past interventions or declots of this access.  The patient is not chronically hypotensive on dialysis.  Current Facility-Administered Medications  Medication Dose Route Frequency Provider Last Rate Last Admin   0.9 %  sodium chloride infusion   Intravenous Continuous Georgiana Spinner, NP       ceFAZolin (ANCEF) IVPB 1 g/50 mL premix  1 g Intravenous 30 min Pre-Op Georgiana Spinner, NP       diphenhydrAMINE (BENADRYL) injection 50 mg  50 mg Intravenous Once PRN Georgiana Spinner, NP       famotidine (PEPCID) tablet 40 mg  40 mg Oral Once PRN Georgiana Spinner, NP       HYDROmorphone (DILAUDID) injection 1 mg  1 mg Intravenous Once PRN Georgiana Spinner, NP       methylPREDNISolone sodium succinate (SOLU-MEDROL) 125 mg/2 mL injection 125 mg  125 mg Intravenous Once PRN Georgiana Spinner, NP       midazolam (VERSED) 2 MG/ML syrup 8 mg  8 mg Oral Once PRN Georgiana Spinner, NP        Past Medical History:  Diagnosis Date   Acute bilateral cerebral infarction in a watershed distribution [facility]) 04/21/2021    a.) MRI brain 04/21/2021 --> patchy ACUTE cortically-based infarcts within the bilateral high frontal lobes and right parietal lobe   Acute cerebral infarction (HCC) 02/13/2021   a.) MRI brain 02/13/2021: acute LEFT hippocampal infarct   Acute cerebral infarction (HCC) 04/21/2021   a.) MRI brain 04/21/2021: subcentimeter ACUTE infarct within the callosal splenium on the LEFT   Anemia of chronic renal failure    Anxiety    Aortic dilatation (HCC) 11/25/2020   a.) TTE 11/25/2020: Ao root measured 38 mm. b.) TTE 04/22/2021: Ao root measured 44 mm; c. 11/2021 Echo: Ao root 40mm.   Atrial fibrillation (HCC)    a.) CHA2DS2-VASc = 6 (CHF, HTN, CVA x2, prior MI, T2DM). b.) rate/rhythm maintained on oral amiodarone + carvedilol; chronic OAC/AP therapy discontinued following ICH   BPH (benign prostatic hyperplasia)    Cardiac arrest (HCC) 07/26/2021   a.) in setting of hyperkalemia, NSTEMI, and seizure following missed HD; pulseless and apneic --> required 1 round of CPR prior to ROSC   Cellulitis    Cerebral hemorrhage (HCC) 11/21/2021   a.) CT head 11/21/2021 --> 1.4 x 1.5 x 3.1 cm ACUTE hemorrhage within the inferior aspect of the fourth ventricle, and extending inferiorly through the foramen of foramen of [place] --> occurred in setting of HTN emergency and DOAC/APT-->eliquis/plavix d/c'd.   Cerebral microvascular disease    Chronic  diarrhea    Chronic heart failure with preserved ejection fraction (HFpEF) (HCC) 07/19/2017   a.) 06/2017 Echo: EF 75%; b.) 11/2020 Echo: EF 55-60%; c.) 04/2021 Echo: EF 55-60%; d.) 11/2021 Echo: EF 65-70%, GrII DD, nl RV fxn, sev dil LA, large circumferential pericardial eff w/o tamponade, triv MR, AoV sclerosis. Ao root 40mm; e.) TTE 02/28/2025: EF 60-65%, sev LVH, mod posterior LV effusion, AoV sclerosis   Chronic pain of both ankles    Coronary artery disease 02/26/2020   a. 02/2020 Ant STEMI/PCI: LM nl, LAD 77m (3.5x30 Resolute Onyx), RI nl, LCX min irregs, RCA min  irregs. EF 65%.   DDD (degenerative disc disease), cervical    Diabetes mellitus, type II (HCC)    Diabetic neuropathy (HCC)    Esophagitis    ESRD (end stage renal disease) on dialysis (HCC)    a.) [facility]; T-Th-Sat   Frequent falls    Gait instability    Gastritis    Gastroparesis    GERD (gastroesophageal reflux disease)    H/O enucleation of left eyeball    Heart palpitations    History of kidney stones    HLD (hyperlipidemia)    Hypertension    Insomnia    a.) uses melatonin PRN   Long term current use of amiodarone    Nausea and vomiting in adult    recurrent   NSTEMI (non-ST elevated myocardial infarction) (HCC) 07/26/2021   a.) hyperkalemic from missed HD; seizures; decompensated to cardiac arrest and required CPR x 1 round prior to ROSC.   Osteomyelitis (HCC)    Pericardial effusion    a. 11/2021 Echo: EF 65-70%, GrII DD, nl RV fxn, sev dil LA, large circumferential pericardial eff w/o tamponade, triv MR, AoV sclerosis. Ao root 40mm.   Seizure (HCC)    a.) last 07/26/2021 in setting of missed HD --> hyperkalemic at 6.5 --> pulseless/apneic and required CPR; discharged home on levetiracetam.   ST elevation myocardial infarction (STEMI) of anterior wall (HCC) 02/26/2020   a.) LHC/PCI 02/26/2020 --> EF 65%; LVEDP 11 mmHg; 90% mLAD (3.5 x 20 mm Resolute Onyx DES x 1)    Past Surgical History:  Procedure Laterality Date   A/V FISTULAGRAM Left 08/31/2021   Procedure: A/V Fistulagram;  Surgeon: Annice Needy, MD;  Location: [name] INVASIVE CV LAB;  Service: Cardiovascular;  Laterality: Left;   A/V FISTULAGRAM Left 01/25/2022   Procedure: A/V Fistulagram;  Surgeon: Annice Needy, MD;  Location: [name] INVASIVE CV [facility];  Service: Cardiovascular;  Laterality: Left;   AMPUTATION Left 03/16/2016   Procedure: AMPUTATION DIGIT LEFT HALLUX;  Surgeon: Vivi Barrack, DPM;  Location: [facility] OR;  Service: Podiatry;  Laterality: Left;  can start around 5    AMPUTATION Right 02/09/2021    Procedure: AMPUTATION DIGIT;  Surgeon: Marlyne Beards, MD;  Location: [facility] OR;  Service: Orthopedics;  Laterality: Right;   AMPUTATION TOE Right 02/28/2022   Procedure: AMPUTATION TOE;  Surgeon: Louann Sjogren, DPM;  Location: [name] ORS;  Service: Podiatry;  Laterality: Right;  right fifth toe   ARTHROSCOPIC REPAIR ACL Left    AV FISTULA PLACEMENT Right 05/31/2019   Procedure: Brachiocephalic AV fistula creation;  Surgeon: Annice Needy, MD;  Location: [name] ORS;  Service: Vascular;  Laterality: Right;   AV FISTULA PLACEMENT Left 08/13/2021   Procedure: ARTERIOVENOUS (AV) FISTULA CREATION (BRACHIALCEPHALIC);  Surgeon: Annice Needy, MD;  Location: [name] ORS;  Service: Vascular;  Laterality: Left;   COLONOSCOPY WITH PROPOFOL N/A 10/28/2015   Procedure:  COLONOSCOPY WITH PROPOFOL;  Surgeon: Christena Deem, MD;  Location: [facility] ENDOSCOPY;  Service: Endoscopy;  Laterality: N/A;   COLONOSCOPY WITH PROPOFOL N/A 10/29/2015   Procedure: COLONOSCOPY WITH PROPOFOL;  Surgeon: Christena Deem, MD;  Location: [facility] ENDOSCOPY;  Service: Endoscopy;  Laterality: N/A;   CORONARY ULTRASOUND/IVUS N/A 02/26/2020   Procedure: Intravascular Ultrasound/IVUS;  Surgeon: Corky Crafts, MD;  Location: [facility] INVASIVE CV LAB;  Service: Cardiovascular;  Laterality: N/A;   CORONARY/GRAFT ACUTE MI REVASCULARIZATION N/A 02/26/2020   Procedure: Coronary/Graft Acute MI Revascularization;  Surgeon: Corky Crafts, MD;  Location: [facility] INVASIVE CV LAB;  Service: Cardiovascular;  Laterality: N/A;   DIALYSIS/PERMA CATHETER INSERTION Right 04/26/2019   Perm Cath    DIALYSIS/PERMA CATHETER INSERTION N/A 04/26/2019   Procedure: DIALYSIS/PERMA CATHETER INSERTION;  Surgeon: Annice Needy, MD;  Location: [name] IN[facility];  Service: Cardiovascular;  Laterality: N/A;   DIALYSIS/PERMA CATHETER INSERTION N/A 05/25/2021   Procedure: DIALYSIS/PERMA CATHETER INSERTION;  Surgeon: Annice Needy, MD;  Location: [name];  Service:  Cardiovascular;  Laterality: N/A;   DIALYSIS/PERMA CATHETER INSERTION N/A 08/31/2021   Procedure: DIALYSIS/PERMA CATHETER INSERTION;  Surgeon: Annice Needy, MD;  Location: [name];  Service: Cardiovascular;  Laterality: N/A;   DIALYSIS/PERMA CATHETER REMOVAL N/A 08/15/2019   Procedure: DIALYSIS/PERMA CATHETER REMOVAL;  Surgeon: Renford Dills, MD;  Location: [name];  Service: Cardiovascular;  Laterality: N/A;   DIALYSIS/PERMA CATHETER REMOVAL N/A 03/15/2022   Procedure: DIALYSIS/PERMA CATHETER REMOVAL;  Surgeon: Annice Needy, MD;  Location: [name];  Service: Cardiovascular;  Laterality: N/A;   EMBOLIZATION (CATH LAB) Right 02/06/2021   Procedure: EMBOLIZATION;  Surgeon: Annice Needy, MD;  Location: [name];  Service: Cardiovascular;  Laterality: Right;  Right Upper Extremity Dialysis Access, Permcath Placement   ESOPHAGOGASTRODUODENOSCOPY (EGD) WITH PROPOFOL N/A 12/27/2017   Procedure: ESOPHAGOGASTRODUODENOSCOPY (EGD) WITH PROPOFOL;  Surgeon: Tol[place], Boykin Nearing, MD;  Location: [name] ENDOSCOPY;  Service: Gastroenterology;  Laterality: N/A;   EYE SURGERY     LEFT HEART CATH AND CORONARY ANGIOGRAPHY N/A 02/26/2020   Procedure: LEFT HEART CATH AND CORONARY ANGIOGRAPHY;  Surgeon: Corky Crafts, MD;  Location: [facility] INVASIVE CV LAB;  Service: Cardiovascular;  Laterality: N/A;   PROSTATE SURGERY  2016   REVISON OF ARTERIOVENOUS FISTULA Right 07/22/2022   Procedure: RESECTION OF ANEURYSMAL RIGHT ARM ARTERIOVENOUS FISTULA;  Surgeon: Annice Needy, MD;  Location: [name] ORS;  Service: Vascular;  Laterality: Right;   TONSILECTOMY/ADENOIDECTOMY WITH MYRINGOTOMY     TONSILLECTOMY     UPPER EXTREMITY ANGIOGRAPHY Right 11/26/2020   Procedure: Upper Extremity Angiography;  Surgeon: Annice Needy, MD;  Location: [name] INVAS[facility] CV LAB;  Service: Cardiovascular;  Laterality: Right;   UPPER EXTREMITY ANGIOGRAPHY Right 02/05/2021   Procedure: UPPER EXTREMITY  ANGIOGRAPHY;  Surgeon: Annice Needy, MD;  Location: [name] INVASIVE CV LAB;  Service: Cardiovascular;  Laterality: Right;     Social History   Tobacco Use   Smoking status: Never   Smokeless tobacco: Never  Vaping Use   Vaping status: Never Used  Substance Use Topics   Alcohol use: No   Drug use: No     Family History  Problem Relation Age of Onset   CAD Father    Stroke Father    Diabetes Mellitus II Mother    Kidney failure Mother    Schizophrenia Mother     No family history of bleeding or clotting disorders, autoimmune disease  or porphyria  Allergies  Allergen Reactions   Promethazine Diarrhea and Other (See Comments)    Muscle cramps   Promethazine Hcl Other (See Comments)    Cramping all over  Other reaction(s): cramp all over   Other     Other reaction(s): Unknown Other reaction(s): Unknown     REVIEW OF SYSTEMS (Negative unless checked)  Constitutional: [] Weight loss  [] Fever  [] Chills Cardiac: [] Chest pain   [] Chest pressure   [x] Palpitations   [] Shortness of breath when laying flat   [] Shortness of breath at rest   [x] Shortness of breath with exertion. Vascular:  [] Pain in legs with walking   [] Pain in legs at rest   [] Pain in legs when laying flat   [] Claudication   [] Pain in feet when walking  [] Pain in feet at rest  [] Pain in feet when laying flat   [] History of DVT   [] Phlebitis   [] Swelling in legs   [] Varicose veins   [] Non-healing ulcers Pulmonary:   [] Uses home oxygen   [] Productive cough   [] Hemoptysis   [] Wheeze  [] COPD   [] Asthma Neurologic:  [] Dizziness  [] Blackouts   [x] Seizures   [] History of stroke   [] History of TIA  [] Aphasia   [] Temporary blindness   [] Dysphagia   [] Weakness or numbness in arms   [] Weakness or numbness in legs Musculoskeletal:  [x] Arthritis   [] Joint swelling   [x] Joint pain   [] Low back pain Hematologic:  [] Easy bruising  [] Easy bleeding   [] Hypercoagulable state   [x] Anemic  [] Hepatitis Gastrointestinal:  [] Blood in stool    [] Vomiting blood  [] Gastroesophageal reflux/heartburn   [] Difficulty swallowing. Genitourinary:  [x] Chronic kidney disease   [] Difficult urination  [] Frequent urination  [] Burning with urination   [] Blood in urine Skin:  [] Rashes   [] Ulcers   [] Wounds Psychological:  [] History of anxiety   []  History of major depression.  Physical Examination  There were no vitals filed for this visit. There is no height or weight on file to calculate BMI. Gen: WD/WN, NAD Head: [place]/AT, No temporalis wasting.  Ear/Nose/Throat: Hearing grossly intact, nares w/o erythema or drainage, oropharynx w/o Erythema/Exudate,  Eyes: Conjunctiva clear, sclera non-icteric Neck: Trachea midline.  No JVD.  Pulmonary:  Good air movement, respirations not labored, no use of accessory muscles.  Cardiac: irregular Vascular: Left arm AVF somewhat pulsatile Vessel Right Left  Radial Palpable Palpable   Musculoskeletal: M/S 5/5 throughout.  Extremities without ischemic changes.  No deformity or atrophy.  Neurologic: Sensation grossly intact in extremities.  Symmetrical.  Speech is fluent. Motor exam as listed above. Psychiatric: Judgment intact, Mood & affect appropriate for pt's clinical situation. Dermatologic: No rashes or ulcers noted.  No cellulitis or open wounds.    CBC Lab Results  Component Value Date   WBC 6.4 07/22/2022   HGB 12.6 (L) 07/22/2022   HCT 37.0 (L) 07/22/2022   MCV 73.4 (L) 07/22/2022   PLT 94 (L) 07/22/2022    BMET    Component Value Date/Time   NA 141 07/22/2022 0734   K 3.8 07/22/2022 0734   CL 111 07/22/2022 0734   CO2 19 (L) 07/22/2022 0733   GLUCOSE 80 07/22/2022 0734   BUN 50 (H) 07/22/2022 0734   CREATININE 13.60 (H) 07/22/2022 0734   CALCIUM 8.5 (L) 07/22/2022 0733   GFRNONAA 4 (L) 07/22/2022 0733   GFRAA 5 (L) 08/27/2019 1540   CrCl cannot be calculated (Patient's most recent lab result is older than the maximum 21 days allowed.).  COAG  Lab Results  Component Value Date    INR 1.1 03/01/2022   INR 1.6 (H) 11/21/2021   INR 1.3 (H) 08/12/2021    Radiology DG Chest 2 View  Result Date: 10/08/2022 CLINICAL DATA:  Cough.  History of hypertension. EXAM: CHEST - 2 VIEW COMPARISON:  June 08, 2022 FINDINGS: The heart size and mediastinal contours are stable. Heart size is enlarged. Both lungs are clear. The visualized skeletal structures are stable. IMPRESSION: No active cardiopulmonary disease.  Cardiomegaly. Electronically Signed   By: Sherian Rein M.D.   On: 10/08/2022 16:05   DG Foot Complete Left  Result Date: 10/06/2022 Please see detailed radiograph report in office note.  DG Foot Complete Right  Result Date: 10/06/2022 Please see detailed radiograph report in office note.  DG Foot Complete Right  Result Date: 10/05/2022 Please see detailed radiograph report in office note.   Assessment/Plan 1.  Complication dialysis device with dysfunction of AV access:  Patient's dialysis access is malfunctioning. The patient will undergo angiography and correction of any problems using interventional techniques with the hope of restoring function to the access.  The risks and benefits were described to the patient.  All questions were answered.  The patient agrees to proceed with angiography and intervention. Potassium will be drawn to ensure that it is an appropriate level prior to performing intervention. 2.  End-stage renal disease requiring hemodialysis:  Patient will continue dialysis therapy without further interruption if a successful intervention is not achieved then a tunneled catheter will be placed. Dialysis has already been arranged. 3.  Hypertension:  Patient will continue medical management; nephrology is following no changes in oral medications. 4. Diabetes mellitus:  Glucose will be monitored and oral medications been held this morning once the patient has undergone the patient's procedure po intake will be reinitiated and again Accu-Cheks will be used  to assess the blood glucose level and treat as needed. The patient will be restarted on the patient's usual hypoglycemic regime 5.  Coronary artery disease:  EKG will be monitored. Nitrates will be used if needed. The patient's oral cardiac medications will be continued.    Festus Barren, MD  10/18/2022 10:44 AM

## 2022-10-19 ENCOUNTER — Encounter: Payer: Self-pay | Admitting: Vascular Surgery

## 2022-10-19 ENCOUNTER — Ambulatory Visit (INDEPENDENT_AMBULATORY_CARE_PROVIDER_SITE_OTHER): Payer: Medicare HMO | Admitting: Gastroenterology

## 2022-10-19 VITALS — BP 102/60 | HR 54 | Temp 97.1°F | Ht 75.0 in | Wt 188.1 lb

## 2022-10-19 DIAGNOSIS — K6289 Other specified diseases of anus and rectum: Secondary | ICD-10-CM

## 2022-10-19 DIAGNOSIS — G8929 Other chronic pain: Secondary | ICD-10-CM

## 2022-10-19 DIAGNOSIS — K909 Intestinal malabsorption, unspecified: Secondary | ICD-10-CM

## 2022-10-19 DIAGNOSIS — R195 Other fecal abnormalities: Secondary | ICD-10-CM | POA: Diagnosis not present

## 2022-10-19 DIAGNOSIS — R197 Diarrhea, unspecified: Secondary | ICD-10-CM

## 2022-10-19 NOTE — Patient Instructions (Addendum)
I will need to review your chart further in regards to any further testing/treatment for your diarrhea I did not see any obvious hemorrhoid on exam, however, you had hemorrhoids on your last colonoscopy As you have anusol cream already, we can try a [place] [facility] compound to see if this helps with your discomfort  Try to keep rectal area clean and dry with warm water, avoid harsh soaps or wet wipes in the rectal area  Follow up 3 months

## 2022-10-19 NOTE — Progress Notes (Unsigned)
Referring Provider: [name], FNP Primary Care Physician:  [name], FNP Primary GI Physician: new (Dr. Tasia Catchings)   Chief Complaint  Patient presents with   Hemorrhoids    Patient here today due to issues with painful hemorrhoids on going for a while. Patient with ESRD has HD Tues,Thurs,Sat. Patient denies seeing any blood in stools, but says the hemorrhoids are painful.   HPI:   [name]. is a 52 y.o. male with past medical history of ESRD on HD t/th/s, cardiac arrest, cerebral hemorrhage, DDD, DM, gastroparesis, HLD, HTN, NSTEMI, seizure, CHF, afib   Patient presenting today for hemorrhoids.  He notes longstanding issue with hemorrhoids, noting a lot of rectal pain, he feels pressure. He has a BM everytime he eats. Reports stools are always diarrhea. This has been ongoing for the past few years. He notes diarrhea since a surgery he had to help his gastroparesis/dumping syndrome (pyloroplasty?). He is having difficulty sitting due to discomfort. He has tried tucks, suppositories, anusol without improvement. He denies bleeding. Somewhat of a difficult historian during the encounter.   NSAID use: just tylenol  Social hx: no etoh or tobacco  Fam hx: no CRC or liver disease  Last Colonoscopy:10/2020 Preparation of the colon was inadequate. - Hemorrhoids found on perianal exam. - Congested mucosa in the entire examined colon. Query  congestive colopathy based on appearance and symptoms.  Biopsied to exclude microscopic colitis or other cause (Colonic mucosa with focal neutrophils in surface epithelium and lamina propria,No chronic colitis or granulomas are seen.) No evidence of lymphocytic or collagenous colitis is seen. of chronic diarrhea. - Diverticulosis in the sigmoid colon. - Non-bleeding internal hemorrhoids.  Last Endoscopy: 2024 Gastritis. Biopsied.  - Normal examined duodenum. Biopsied. Chronic inactive gastritis, no h pylori   Recommendations:     Past Medical History:  Diagnosis Date   Acute bilateral cerebral infarction in a watershed distribution [facility]) 04/21/2021   a.) MRI brain 04/21/2021 --> patchy ACUTE cortically-based infarcts within the bilateral high frontal lobes and right parietal lobe   Acute cerebral infarction (HCC) 02/13/2021   a.) MRI brain 02/13/2021: acute LEFT hippocampal infarct   Acute cerebral infarction (HCC) 04/21/2021   a.) MRI brain 04/21/2021: subcentimeter ACUTE infarct within the callosal splenium on the LEFT   Anemia of chronic renal failure    Anxiety    Aortic dilatation (HCC) 11/25/2020   a.) TTE 11/25/2020: Ao root measured 38 mm. b.) TTE 04/22/2021: Ao root measured 44 mm; c. 11/2021 Echo: Ao root 40mm.   Atrial fibrillation (HCC)    a.) CHA2DS2-VASc = 6 (CHF, HTN, CVA x2, prior MI, T2DM). b.) rate/rhythm maintained on oral amiodarone + carvedilol; chronic OAC/AP therapy discontinued following ICH   BPH (benign prostatic hyperplasia)    Cardiac arrest (HCC) 07/26/2021   a.) in setting of hyperkalemia, NSTEMI, and seizure following missed HD; pulseless and apneic --> required 1 round of CPR prior to ROSC   Cellulitis    Cerebral hemorrhage (HCC) 11/21/2021   a.) CT head 11/21/2021 --> 1.4 x 1.5 x 3.1 cm ACUTE hemorrhage within the inferior aspect of the fourth ventricle, and extending inferiorly through the foramen of foramen of [place] --> occurred in setting of HTN emergency and DOAC/APT-->eliquis/plavix d/c'd.   Cerebral microvascular disease    Chronic diarrhea    Chronic heart failure with preserved ejection fraction (HFpEF) (HCC) 07/19/2017   a.) 06/2017 Echo: EF 75%; b.) 11/2020 Echo: EF 55-60%; c.) 04/2021 Echo: EF 55-60%; d.)  11/2021 Echo: EF 65-70%, GrII DD, nl RV fxn, sev dil LA, large circumferential pericardial eff w/o tamponade, triv MR, AoV sclerosis. Ao root 40mm; e.) TTE 02/28/2025: EF 60-65%, sev LVH, mod posterior LV effusion, AoV sclerosis   Chronic pain of both ankles     Coronary artery disease 02/26/2020   a. 02/2020 Ant STEMI/PCI: LM nl, LAD 58m (3.5x30 Resolute Onyx), RI nl, LCX min irregs, RCA min irregs. EF 65%.   DDD (degenerative disc disease), cervical    Diabetes mellitus, type II (HCC)    Diabetic neuropathy (HCC)    Esophagitis    ESRD (end stage renal disease) on dialysis (HCC)    a.) [facility]; T-Th-Sat   Frequent falls    Gait instability    Gastritis    Gastroparesis    GERD (gastroesophageal reflux disease)    H/O enucleation of left eyeball    Heart palpitations    History of kidney stones    HLD (hyperlipidemia)    Hypertension    Insomnia    a.) uses melatonin PRN   Long term current use of amiodarone    Nausea and vomiting in adult    recurrent   NSTEMI (non-ST elevated myocardial infarction) (HCC) 07/26/2021   a.) hyperkalemic from missed HD; seizures; decompensated to cardiac arrest and required CPR x 1 round prior to ROSC.   Osteomyelitis (HCC)    Pericardial effusion    a. 11/2021 Echo: EF 65-70%, GrII DD, nl RV fxn, sev dil LA, large circumferential pericardial eff w/o tamponade, triv MR, AoV sclerosis. Ao root 40mm.   Seizure (HCC)    a.) last 07/26/2021 in setting of missed HD --> hyperkalemic at 6.5 --> pulseless/apneic and required CPR; discharged home on levetiracetam.   ST elevation myocardial infarction (STEMI) of anterior wall (HCC) 02/26/2020   a.) LHC/PCI 02/26/2020 --> EF 65%; LVEDP 11 mmHg; 90% mLAD (3.5 x 20 mm Resolute Onyx DES x 1)    Past Surgical History:  Procedure Laterality Date   A/V FISTULAGRAM Left 08/31/2021   Procedure: A/V Fistulagram;  Surgeon: Annice Needy, MD;  Location: [name] INVASIVE CV LAB;  Service: Cardiovascular;  Laterality: Left;   A/V FISTULAGRAM Left 01/25/2022   Procedure: A/V Fistulagram;  Surgeon: Annice Needy, MD;  Location: [name];  Service: Cardiovascular;  Laterality: Left;   A/V FISTULAGRAM Right 10/18/2022   Procedure: A/V Fistulagram;  Surgeon: Annice Needy, MD;   Location: [name];  Service: Cardiovascular;  Laterality: Right;   AMPUTATION Left 03/16/2016   Procedure: AMPUTATION DIGIT LEFT HALLUX;  Surgeon: Vivi Barrack, DPM;  Location: [facility] OR;  Service: Podiatry;  Laterality: Left;  can start around 5    AMPUTATION Right 02/09/2021   Procedure: AMPUTATION DIGIT;  Surgeon: Marlyne Beards, MD;  Location: [facility] OR;  Service: Orthopedics;  Laterality: Right;   AMPUTATION TOE Right 02/28/2022   Procedure: AMPUTATION TOE;  Surgeon: Louann Sjogren, DPM;  Location: [name] ORS;  Service: Podiatry;  Laterality: Right;  right fifth toe   ARTHROSCOPIC REPAIR ACL Left    AV FISTULA PLACEMENT Right 05/31/2019   Procedure: Brachiocephalic AV fistula creation;  Surgeon: Annice Needy, MD;  Location: [name] ORS;  Service: Vascular;  Laterality: Right;   AV FISTULA PLACEMENT Left 08/13/2021   Procedure: ARTERIOVENOUS (AV) FISTULA CREATION (BRACHIALCEPHALIC);  Surgeon: Annice Needy, MD;  Location: [name] ORS;  Service: Vascular;  Laterality: Left;   COLONOSCOPY WITH PROPOFOL N/A 10/28/2015   Procedure: COLONOSCOPY WITH PROPOFOL;  Surgeon: Christena Deem, MD;  Location: [facility] ENDOSCOPY;  Service: Endoscopy;  Laterality: N/A;   COLONOSCOPY WITH PROPOFOL N/A 10/29/2015   Procedure: COLONOSCOPY WITH PROPOFOL;  Surgeon: Christena Deem, MD;  Location: [facility] ENDOSCOPY;  Service: Endoscopy;  Laterality: N/A;   CORONARY ULTRASOUND/IVUS N/A 02/26/2020   Procedure: Intravascular Ultrasound/IVUS;  Surgeon: Corky Crafts, MD;  Location: [facility] INVASIVE CV LAB;  Service: Cardiovascular;  Laterality: N/A;   CORONARY/GRAFT ACUTE MI REVASCULARIZATION N/A 02/26/2020   Procedure: Coronary/Graft Acute MI Revascularization;  Surgeon: Corky Crafts, MD;  Location: [facility] INVASIVE CV LAB;  Service: Cardiovascular;  Laterality: N/A;   DIALYSIS/PERMA CATHETER INSERTION Right 04/26/2019   Perm Cath    DIALYSIS/PERMA CATHETER INSERTION N/A 04/26/2019   Procedure: DIALYSIS/PERMA  CATHETER INSERTION;  Surgeon: Annice Needy, MD;  Location: [name];  Service: Cardiovascular;  Laterality: N/A;   DIALYSIS/PERMA CATHETER INSERTION N/A 05/25/2021   Procedure: DIALYSIS/PERMA CATHETER INSERTION;  Surgeon: Annice Needy, MD;  Location: [name];  Service: Cardiovascular;  Laterality: N/A;   DIALYSIS/PERMA CATHETER INSERTION N/A 08/31/2021   Procedure: DIALYSIS/PERMA CATHETER INSERTION;  Surgeon: Annice Needy, MD;  Location: [name];  Service: Cardiovascular;  Laterality: N/A;   DIALYSIS/PERMA CATHETER REMOVAL N/A 08/15/2019   Procedure: DIALYSIS/PERMA CATHETER REMOVAL;  Surgeon: Renford Dills, MD;  Location: [name];  Service: Cardiovascular;  Laterality: N/A;   DIALYSIS/PERMA CATHETER REMOVAL N/A 03/15/2022   Procedure: DIALYSIS/PERMA CATHETER REMOVAL;  Surgeon: Annice Needy, MD;  Location: [name];  Service: Cardiovascular;  Laterality: N/A;   EMBOLIZATION (CATH LAB) Right 02/06/2021   Procedure: EMBOLIZATION;  Surgeon: Annice Needy, MD;  Location: [name];  Service: Cardiovascular;  Laterality: Right;  Right Upper Extremity Dialysis Access, Permcath Placement   ESOPHAGOGASTRODUODENOSCOPY (EGD) WITH PROPOFOL N/A 12/27/2017   Procedure: ESOPHAGOGASTRODUODENOSCOPY (EGD) WITH PROPOFOL;  Surgeon: Toledo, Boykin Nearing, MD;  Location: [name] ENDOSCOPY;  Service: Gastroenterology;  Laterality: N/A;   EYE SURGERY     LEFT HEART CATH AND CORONARY ANGIOGRAPHY N/A 02/26/2020   Procedure: LEFT HEART CATH AND CORONARY ANGIOGRAPHY;  Surgeon: Corky Crafts, MD;  Location: [facility] INVASIVE CV LAB;  Service: Cardiovascular;  Laterality: N/A;   PROSTATE SURGERY  2016   REVISON OF ARTERIOVENOUS FISTULA Right 07/22/2022   Procedure: RESECTION OF ANEURYSMAL RIGHT ARM ARTERIOVENOUS FISTULA;  Surgeon: Annice Needy, MD;  Location: [name] ORS;  Service: Vascular;  Laterality: Right;   TONSILECTOMY/ADENOIDECTOMY WITH MYRINGOTOMY      TONSILLECTOMY     UPPER EXTREMITY ANGIOGRAPHY Right 11/26/2020   Procedure: Upper Extremity Angiography;  Surgeon: Annice Needy, MD;  Location: [name];  Service: Cardiovascular;  Laterality: Right;   UPPER EXTREMITY ANGIOGRAPHY Right 02/05/2021   Procedure: UPPER EXTREMITY ANGIOGRAPHY;  Surgeon: Annice Needy, MD;  Location: [name];  Service: Cardiovascular;  Laterality: Right;    Current Outpatient Medications  Medication Sig Dispense Refill   acetaminophen (TYLENOL) 325 MG tablet Take 1-2 tablets (325-650 mg total) by mouth every 4 (four) hours as needed for mild pain.     amiodarone (PACERONE) 200 MG tablet Take 1 tablet (200 mg total) by mouth every morning. 90 tablet 2   amLODipine (NORVASC) 10 MG tablet Take 1 tablet (10 mg total) by mouth daily. (Patient taking differently: Take 5 mg by mouth daily.) 30 tablet 2   ammonium lactate (AMLACTIN) 12 % cream Apply 1 Application topically as needed for  dry skin. 385 g 0   amphetamine-dextroamphetamine (ADDERALL XR) 5 MG 24 hr capsule Take 5 mg by mouth daily.     atorvastatin (LIPITOR) 80 MG tablet TAKE ONE TABLET BY MOUTH AT BEDTIME. 90 tablet 3   calcium acetate (PHOSLO) 667 MG capsule Take 2 capsules (1,334 mg total) by mouth 3 (three) times daily with meals. 180 capsule 0   carvedilol (COREG) 25 MG tablet Take 25 mg by mouth 2 (two) times daily.     fluticasone (FLONASE) 50 MCG/ACT nasal spray Place 2 sprays into both nostrils daily. 16 g 0   hydrALAZINE (APRESOLINE) 25 MG tablet Take 25 mg by mouth 3 (three) times daily.     hydrocortisone (ANUSOL-HC) 2.5 % rectal cream Place 1 Application rectally 2 (two) times daily. 30 g 0   levETIRAcetam (KEPPRA) 250 MG tablet Take 1 tablet (250 mg total) by mouth Every Tuesday,Thursday,and Saturday with dialysis. 15 tablet 1   levETIRAcetam (KEPPRA) 500 MG tablet Take 1 tablet (500 mg total) by mouth daily after breakfast. 30 tablet 1   lidocaine-prilocaine (EMLA) cream  Apply topically.     nitroGLYCERIN (NITROSTAT) 0.4 MG SL tablet Place 1 tablet (0.4 mg total) under the tongue every 5 (five) minutes as needed for chest pain. 25 tablet 6   No current facility-administered medications for this visit.    Allergies as of 10/19/2022 - Review Complete 10/19/2022  Allergen Reaction Noted   Promethazine Diarrhea and Other (See Comments) 03/06/2015   Promethazine hcl Other (See Comments) 03/06/2015   Other  05/29/2020    Family History  Problem Relation Age of Onset   CAD Father    Stroke Father    Diabetes Mellitus II Mother    Kidney failure Mother    Schizophrenia Mother     Social History   Socioeconomic History   Marital status: Married    Spouse name: Martie Lee    Number of children: 2   Years of education: Not on file   Highest education level: High school graduate  Occupational History   Occupation: Disability    Comment: not employed  Tobacco Use   Smoking status: Never   Smokeless tobacco: Never  Vaping Use   Vaping status: Never Used  Substance and Sexual Activity   Alcohol use: No   Drug use: No   Sexual activity: Yes  Other Topics Concern   Not on file  Social History Narrative   Oldest son killed in car crash June 2020.  Lives in [place] with his wife.   Social Determinants of Health   Financial Resource Strain: High Risk (07/05/2017)   Overall Financial Resource Strain (CARDIA)    Difficulty of Paying Living Expenses: Hard  Food Insecurity: No Food Insecurity (02/28/2022)   Hunger Vital Sign    Worried About Running Out of Food in the Last Year: Never true    Ran Out of Food in the Last Year: Never true  Transportation Needs: No Transportation Needs (02/28/2022)   PRAPARE - Administrator, Civil Service (Medical): No    Lack of Transportation (Non-Medical): No  Physical Activity: Inactive (07/05/2017)   Exercise Vital Sign    Days of Exercise per Week: 0 days    Minutes of Exercise per Session: 0 min   Stress: Stress Concern Present (07/05/2017)   [employer] of Occupational Health - Occupational Stress Questionnaire    Feeling of Stress : Rather much  Social Connections: Moderately Integrated (07/05/2017)   Social Connection  and Isolation Panel [NHANES]    Frequency of Communication with Friends and Family: Three times a week    Frequency of Social Gatherings with Friends and Family: Once a week    Attends Religious Services: More than 4 times per year    Active Member of [employer] or Organizations: No    Attends Engineer, structural: Never    Marital Status: Married    Review of systems General: negative for malaise, night sweats, fever, chills, weight los Neck: Negative for lumps, goiter, pain and significant neck swelling Resp: Negative for cough, wheezing, dyspnea at rest CV: Negative for chest pain, leg swelling, palpitations, orthopnea GI: denies melena, hematochezia, nausea, vomiting, diarrhea, constipation, dysphagia, odyonophagia, early satiety or unintentional weight loss.  MSK: Negative for joint pain or swelling, back pain, and muscle pain. Derm: Negative for itching or rash Psych: Denies depression, anxiety, memory loss, confusion. No homicidal or suicidal ideation.  Heme: Negative for prolonged bleeding, bruising easily, and swollen nodes. Endocrine: Negative for cold or heat intolerance, polyuria, polydipsia and goiter. Neuro: negative for tremor, gait imbalance, syncope and seizures. The remainder of the review of systems is noncontributory.  Physical Exam: BP 102/60 (BP Location: Left Arm, Patient Position: Sitting, Cuff Size: Normal)   Pulse (!) 54   Temp (!) 97.1 F (36.2 C) (Temporal)   Ht 6\' 3"  (1.905 m)   Wt 188 lb 1.6 oz (85.3 kg)   BMI 23.51 kg/m  General:   Alert and oriented. No distress noted. Pleasant and cooperative.  Head:  Normocephalic and atraumatic. Eyes:  Conjuctiva clear without scleral icterus. Mouth:  Oral mucosa pink and  moist. Good dentition. No lesions. Heart: Normal rate and rhythm, s1 and s2 heart sounds present.  Lungs: Clear lung sounds in all lobes. Respirations equal and unlabored. Abdomen:  +BS, soft, non-tender and non-distended. No rebound or guarding. No HSM or masses noted. Rectal: crystal sutton CMA present as witness, no obvious masses, lesions or hemorrhoids, patient with a lot of discomfort on DRE, sphincter tone WNL Derm: No palmar erythema or jaundice Msk:  Symmetrical without gross deformities. Normal posture. Extremities:  Without edema. Neurologic:  Alert and  oriented x4 Psych:  Alert and cooperative. Normal mood and affect.  Invalid input(s): "6 MONTHS"   ASSESSMENT: [name]. is a 52 y.o. male presenting today for rectal pain/hemorrhoids and with ongoing diarrhea and fecal urgency.   Chronic diarrhea: Previously followed with GI at [facility], last saw them in May for Epigastric pain, he underwent EGD 2024 and colonoscopy in 2022, has had extensive workup for diarrhea without findings.   History of pyloroplasty in 2023, Was referred to dietician thereafter which patient reportedly went to thought no documentation in chart regarding this.   Last TSH in feb was 6.017. He has been on cholestyramine for diarrhea  and fecal urgency in the past, appears per the notes he had some improvement, though he tells me he did not. he also tried lomotil without improvement. Acarbose also did not improve anything for him, nor did anti spasmodics.   Celiac panel, hydrogen breath test and pancreatic elastase was 84, though does not appear he was tried on any pancreatic enzymes as it was thought low elastase secondary to watery stools from C diff that he had in 2023.  Biopsies on Colonoscopy in 2022 without microscopic colitis or evidence of IBD. Reassuringly he has had no weight loss, denies rectal bleeding or melena. He is not taking anything for his diarrhea.  Considering all other testing  thus far has been negative and low fecal elastase in the past, will start patient on Creon to see if EPI may actually the cause of his ongoing diarrhea and urgency.   Rectal Pain: Rectal exam without obvious lesions or hemorrhoids though patient presented with a lot of discomfort during DRE. Per chart review, There has been concern for possible pelvic floor dysfunction, referred to PFT which he did not go to, though no anorectal manometry has been performed to date that I can see.   He has tried anusol cream without results. Will send [facility] cream to try for possible hemorrhoids as last TCS showed hemorrhoids and no other findings on rectal exam today. There may be some underlying irritation from frequent bowel movements as well though no obvious erythema or skin irritation on exam.   PLAN:   [facility] Hemorrhoid cream QID  2.  Keep rectal area clean and dry 3. Start creon 72k units per meal/36kunits per snack  All questions were answered, patient verbalized understanding and is in agreement with plan as outlined above.   Follow Up: 3 months   Shameer Molstad L. Jeanmarie Hubert, MSN, APRN, AGNP-C Adult-Gerontology Nurse Practitioner [facility]

## 2022-10-29 DIAGNOSIS — G8929 Other chronic pain: Secondary | ICD-10-CM | POA: Insufficient documentation

## 2022-10-29 DIAGNOSIS — R195 Other fecal abnormalities: Secondary | ICD-10-CM | POA: Insufficient documentation

## 2022-11-01 ENCOUNTER — Telehealth (INDEPENDENT_AMBULATORY_CARE_PROVIDER_SITE_OTHER): Payer: Self-pay | Admitting: Gastroenterology

## 2022-11-01 MED ORDER — PANCRELIPASE (LIP-PROT-AMYL) 36000-114000 UNITS PO CPEP: ORAL_CAPSULE | ORAL | 11 refills | Status: DC

## 2022-11-01 NOTE — Telephone Encounter (Signed)
Discussed with patient per [facility] - Can we let patient know that after I was able to review his chart and records from previous GI, some labs he had indicate possible pancreatic insufficiency, meaning his pancreas does not produce appropriate amount of enzymes to break down his food, this could cause diarrhea, I have sent creon to his pharmacy to start on to see if this helps with his diarrhea.   Patient verbalized understanding.

## 2022-11-03 ENCOUNTER — Ambulatory Visit (INDEPENDENT_AMBULATORY_CARE_PROVIDER_SITE_OTHER): Payer: Medicare HMO | Admitting: Podiatry

## 2022-11-03 DIAGNOSIS — Z91199 Patient's noncompliance with other medical treatment and regimen due to unspecified reason: Secondary | ICD-10-CM

## 2022-11-03 NOTE — Progress Notes (Signed)
No show

## 2022-11-10 ENCOUNTER — Ambulatory Visit (HOSPITAL_COMMUNITY)
Admission: RE | Admit: 2022-11-10 | Discharge: 2022-11-10 | Disposition: A | Discharge status: Home / Self Care | Payer: Medicare HMO | Source: Ambulatory Visit | Attending: Nurse Practitioner | Admitting: Nurse Practitioner

## 2022-11-10 DIAGNOSIS — Z992 Dependence on renal dialysis: Secondary | ICD-10-CM | POA: Diagnosis present

## 2022-11-10 DIAGNOSIS — N186 End stage renal disease: Secondary | ICD-10-CM | POA: Diagnosis present

## 2022-11-10 DIAGNOSIS — I10 Essential (primary) hypertension: Secondary | ICD-10-CM | POA: Insufficient documentation

## 2022-11-10 DIAGNOSIS — I639 Cerebral infarction, unspecified: Secondary | ICD-10-CM | POA: Diagnosis present

## 2022-11-10 DIAGNOSIS — I25118 Atherosclerotic heart disease of native coronary artery with other forms of angina pectoris: Secondary | ICD-10-CM | POA: Insufficient documentation

## 2022-11-10 DIAGNOSIS — I3139 Other pericardial effusion (noninflammatory): Secondary | ICD-10-CM

## 2022-11-10 DIAGNOSIS — I4891 Unspecified atrial fibrillation: Secondary | ICD-10-CM | POA: Diagnosis present

## 2022-11-10 LAB — ECHOCARDIOGRAM COMPLETE
Area-P 1/2: 3.21 cm2
S' Lateral: 3.2 cm

## 2022-11-10 NOTE — Progress Notes (Signed)
*  PRELIMINARY RESULTS* Echocardiogram 2D Echocardiogram has been performed.  Stacey Drain 11/10/2022, 9:41 AM

## 2022-11-11 ENCOUNTER — Telehealth: Payer: Self-pay

## 2022-11-11 NOTE — Telephone Encounter (Signed)
Pt requesting call back to schedule colonoscopy.

## 2022-11-15 ENCOUNTER — Other Ambulatory Visit (INDEPENDENT_AMBULATORY_CARE_PROVIDER_SITE_OTHER): Payer: Self-pay | Admitting: Vascular Surgery

## 2022-11-15 DIAGNOSIS — N186 End stage renal disease: Secondary | ICD-10-CM

## 2022-11-16 ENCOUNTER — Telehealth (INDEPENDENT_AMBULATORY_CARE_PROVIDER_SITE_OTHER): Payer: Self-pay | Admitting: Gastroenterology

## 2022-11-16 NOTE — Telephone Encounter (Signed)
Pt left voicemail in regards to scheduling colonoscopy. Pt was in on 10/19/22. Please advise. Thank you

## 2022-11-17 ENCOUNTER — Ambulatory Visit (INDEPENDENT_AMBULATORY_CARE_PROVIDER_SITE_OTHER): Payer: Medicare HMO | Admitting: Nurse Practitioner

## 2022-11-17 ENCOUNTER — Encounter (INDEPENDENT_AMBULATORY_CARE_PROVIDER_SITE_OTHER): Payer: Medicare HMO

## 2022-11-17 NOTE — Telephone Encounter (Signed)
Patient states for over a year he has been having trouble with hemorrhoids. No bleeding but states since he has gastroparesis he has 4 -5 stools per day and having a lot of pain with BM. Has tried he states everything otc and anusol for hemorrhoids and nothing helping.

## 2022-11-17 NOTE — Telephone Encounter (Signed)
Left message to return call 

## 2022-11-18 NOTE — Telephone Encounter (Signed)
Discussed with patient he states no improvement with diarrhea since starting creon and he is taking one with each meal and one with snacks. Diarrhea is the same. I told him colonscopy is not warranted at this time for hemorrhoids alone and he told me he needs that colonoscopy because there is something else going on. I asked him what makes him think that and he said he has bad pain when having bowel movements. Has 2 -3 stools per day. States pain is so bad at times after bm he can't sit down and hurts to walk.

## 2022-11-22 NOTE — Telephone Encounter (Signed)
Discussed with patient. He does not want to increase creon. He said he needs colonoscopy. He did say to call in [facility] hemorrhoid cream and he will try that but he still wants colonoscopy scheduled. He has been scheduled for Thursday 10:30 with dr Levon Hedger for office visit. Pt aware of appt.

## 2022-11-24 ENCOUNTER — Encounter (INDEPENDENT_AMBULATORY_CARE_PROVIDER_SITE_OTHER): Payer: Medicare HMO

## 2022-11-25 ENCOUNTER — Ambulatory Visit (INDEPENDENT_AMBULATORY_CARE_PROVIDER_SITE_OTHER): Payer: Medicare HMO | Admitting: Gastroenterology

## 2022-11-25 ENCOUNTER — Encounter (INDEPENDENT_AMBULATORY_CARE_PROVIDER_SITE_OTHER): Payer: Self-pay | Admitting: Gastroenterology

## 2022-12-21 ENCOUNTER — Ambulatory Visit (HOSPITAL_COMMUNITY): Admission: RE | Admit: 2022-12-21 | Payer: Medicare HMO | Source: Ambulatory Visit

## 2022-12-30 ENCOUNTER — Encounter (INDEPENDENT_AMBULATORY_CARE_PROVIDER_SITE_OTHER): Payer: Self-pay | Admitting: Gastroenterology

## 2022-12-30 ENCOUNTER — Telehealth (INDEPENDENT_AMBULATORY_CARE_PROVIDER_SITE_OTHER): Payer: Self-pay | Admitting: *Deleted

## 2022-12-30 ENCOUNTER — Ambulatory Visit (INDEPENDENT_AMBULATORY_CARE_PROVIDER_SITE_OTHER): Payer: Medicare HMO | Admitting: Gastroenterology

## 2022-12-30 ENCOUNTER — Encounter (INDEPENDENT_AMBULATORY_CARE_PROVIDER_SITE_OTHER): Payer: Self-pay | Admitting: *Deleted

## 2022-12-30 VITALS — BP 102/59 | HR 62 | Temp 97.3°F | Ht 75.0 in | Wt 182.0 lb

## 2022-12-30 DIAGNOSIS — R197 Diarrhea, unspecified: Secondary | ICD-10-CM

## 2022-12-30 DIAGNOSIS — K6289 Other specified diseases of anus and rectum: Secondary | ICD-10-CM

## 2022-12-30 DIAGNOSIS — G8929 Other chronic pain: Secondary | ICD-10-CM

## 2022-12-30 MED ORDER — PEG 3350-KCL-NA BICARB-NACL 420 G PO SOLR: 4000.0000 mL | Freq: Once | ORAL | 0 refills | Status: AC

## 2022-12-30 NOTE — Telephone Encounter (Signed)
Called pt and made him aware of his pre-op appt details. He voiced understanding

## 2022-12-30 NOTE — Progress Notes (Signed)
Referring Provider: [name], FNP Primary Care Physician:  [name], FNP Primary GI Physician: Dr. Tasia Catchings   Chief Complaint  Patient presents with   Follow-up    Patient here today for a follow up on weight loss. He is still having diarrhea. When has a bowel movement puts pressure on his groin area. He wants to do a Colonoscopy.   HPI:   [name]. is a 52 y.o. male with past medical history of  ESRD on HD t/th/s, cardiac arrest, cerebral hemorrhage, DDD, DM, gastroparesis, HLD, HTN, NSTEMI, seizure, CHF, afib   Patient presenting today for follow up of diarrhea and rectal pain  Last seen August 2024, at that time, patient presenting for rectal pain, pressure and diarrhea. 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 Tried tucks, anusol without improvement.   Patient notably had previous testing at prior GI with low fecal elastase, recommended to start creon, [place] [facility] hemorrhoid cream, keep rectal area clean and dry  Present:  Patient states that he continues to have diarrhea everytime he eats. Notes this since his surgery, as above. Has mild abdominal discomfort but reports mostly rectal pain and pressure. He denies blood in stools or melena. Denies any improvement with use of creon which he took full prescription of. He denies any history of constipation. Appetite is not good. He is down about 6 pounds since August. He avoid eating due to diarrhea. He is not taking anything for his diarrhea. He has taken colestipol in the past that did not help either. Denies heartburn or acid reflux.   He has some nausea at times when he has not eaten. He notes he is very fatigued a lot as well.   Last TSH in February was 6.017. labs in may with potassium 3.8, sodium 143, calcium 8.5    Previous workup/treatments: Failed cholestyramine, lomotil, acarbose, creon and anti spasmodics    C Diff in  2023 Celiac panel, hydrogen breath test negative, calprotecin 135, calcium slightly low at 8.5 pancreatic elastase was 84  Biopsies on Colonoscopy in 2022 without microscopic colitis or evidence of IBD.  GES: 05/2021: delayed gastric emptying UGI series; 05/2021 normal Last Colonoscopy:10/2020 Preparation of the colon was inadequate. - Hemorrhoids found on perianal exam. - Congested mucosa in the entire examined colon. Query  congestive colopathy based on appearance and symptoms.  Biopsied to exclude microscopic colitis or other cause (Colonic mucosa with focal neutrophils in surface epithelium and lamina propria,No chronic colitis or granulomas are seen.) No evidence of lymphocytic or collagenous colitis is seen. of chronic diarrhea. - Diverticulosis in the sigmoid colon. - Non-bleeding internal hemorrhoids.  Last Endoscopy: 2024 Gastritis. Biopsied.  - Normal examined duodenum. Biopsied. Chronic inactive gastritis, no h pylori    Past Medical History:  Diagnosis Date   Acute bilateral cerebral infarction in a watershed distribution [facility]) 04/21/2021   a.) MRI brain 04/21/2021 --> patchy ACUTE cortically-based infarcts within the bilateral high frontal lobes and right parietal lobe   Acute cerebral infarction (HCC) 02/13/2021   a.) MRI brain 02/13/2021: acute LEFT hippocampal infarct   Acute cerebral infarction (HCC) 04/21/2021   a.) MRI brain 04/21/2021: subcentimeter ACUTE infarct within the callosal splenium on the LEFT   Anemia of chronic renal failure    Anxiety    Aortic dilatation (HCC) 11/25/2020   a.) TTE 11/25/2020: Ao root measured 38 mm. b.) TTE 04/22/2021: Ao root measured 44 mm; c. 11/2021 Echo:  Ao root 40mm.   Atrial fibrillation (HCC)    a.) CHA2DS2-VASc = 6 (CHF, HTN, CVA x2, prior MI, T2DM). b.) rate/rhythm maintained on oral amiodarone + carvedilol; chronic OAC/AP therapy discontinued following ICH   BPH (benign prostatic hyperplasia)    Cardiac arrest (HCC)  07/26/2021   a.) in setting of hyperkalemia, NSTEMI, and seizure following missed HD; pulseless and apneic --> required 1 round of CPR prior to ROSC   Cellulitis    Cerebral hemorrhage (HCC) 11/21/2021   a.) CT head 11/21/2021 --> 1.4 x 1.5 x 3.1 cm ACUTE hemorrhage within the inferior aspect of the fourth ventricle, and extending inferiorly through the foramen of foramen of [place] --> occurred in setting of HTN emergency and DOAC/APT-->eliquis/plavix d/c'd.   Cerebral microvascular disease    Chronic diarrhea    Chronic heart failure with preserved ejection fraction (HFpEF) (HCC) 07/19/2017   a.) 06/2017 Echo: EF 75%; b.) 11/2020 Echo: EF 55-60%; c.) 04/2021 Echo: EF 55-60%; d.) 11/2021 Echo: EF 65-70%, GrII DD, nl RV fxn, sev dil LA, large circumferential pericardial eff w/o tamponade, triv MR, AoV sclerosis. Ao root 40mm; e.) TTE 02/28/2025: EF 60-65%, sev LVH, mod posterior LV effusion, AoV sclerosis   Chronic pain of both ankles    Coronary artery disease 02/26/2020   a. 02/2020 Ant STEMI/PCI: LM nl, LAD 62m (3.5x30 Resolute Onyx), RI nl, LCX min irregs, RCA min irregs. EF 65%.   DDD (degenerative disc disease), cervical    Diabetes mellitus, type II (HCC)    Diabetic neuropathy (HCC)    Esophagitis    ESRD (end stage renal disease) on dialysis (HCC)    a.) [facility]; T-Th-Sat   Frequent falls    Gait instability    Gastritis    Gastroparesis    GERD (gastroesophageal reflux disease)    H/O enucleation of left eyeball    Heart palpitations    History of kidney stones    HLD (hyperlipidemia)    Hypertension    Insomnia    a.) uses melatonin PRN   Long term current use of amiodarone    Nausea and vomiting in adult    recurrent   NSTEMI (non-ST elevated myocardial infarction) (HCC) 07/26/2021   a.) hyperkalemic from missed HD; seizures; decompensated to cardiac arrest and required CPR x 1 round prior to ROSC.   Osteomyelitis (HCC)    Pericardial effusion    a. 11/2021 Echo: EF  65-70%, GrII DD, nl RV fxn, sev dil LA, large circumferential pericardial eff w/o tamponade, triv MR, AoV sclerosis. Ao root 40mm.   Seizure (HCC)    a.) last 07/26/2021 in setting of missed HD --> hyperkalemic at 6.5 --> pulseless/apneic and required CPR; discharged home on levetiracetam.   ST elevation myocardial infarction (STEMI) of anterior wall (HCC) 02/26/2020   a.) LHC/PCI 02/26/2020 --> EF 65%; LVEDP 11 mmHg; 90% mLAD (3.5 x 20 mm Resolute Onyx DES x 1)    Past Surgical History:  Procedure Laterality Date   A/V FISTULAGRAM Left 08/31/2021   Procedure: A/V Fistulagram;  Surgeon: Annice Needy, MD;  Location: [name] INVASIVE CV LAB;  Service: Cardiovascular;  Laterality: Left;   A/V FISTULAGRAM Left 01/25/2022   Procedure: A/V Fistulagram;  Surgeon: Annice Needy, MD;  Location: [name] INVASIVE CV LAB;  Service: Cardiovascular;  Laterality: Left;   A/V FISTULAGRAM Right 10/18/2022   Procedure: A/V Fistulagram;  Surgeon: Annice Needy, MD;  Location: [name] INVASIVE CV LAB;  Service: Cardiovascular;  Laterality: Right;  AMPUTATION Left 03/16/2016   Procedure: AMPUTATION DIGIT LEFT HALLUX;  Surgeon: Vivi Barrack, DPM;  Location: [facility] OR;  Service: Podiatry;  Laterality: Left;  can start around 5    AMPUTATION Right 02/09/2021   Procedure: AMPUTATION DIGIT;  Surgeon: Marlyne Beards, MD;  Location: [facility] OR;  Service: Orthopedics;  Laterality: Right;   AMPUTATION TOE Right 02/28/2022   Procedure: AMPUTATION TOE;  Surgeon: Louann Sjogren, DPM;  Location: [name] ORS;  Service: Podiatry;  Laterality: Right;  right fifth toe   ARTHROSCOPIC REPAIR ACL Left    AV FISTULA PLACEMENT Right 05/31/2019   Procedure: Brachiocephalic AV fistula creation;  Surgeon: Annice Needy, MD;  Location: [name] ORS;  Service: Vascular;  Laterality: Right;   AV FISTULA PLACEMENT Left 08/13/2021   Procedure: ARTERIOVENOUS (AV) FISTULA CREATION (BRACHIALCEPHALIC);  Surgeon: Annice Needy, MD;  Location: [name] ORS;  Service:  Vascular;  Laterality: Left;   COLONOSCOPY WITH PROPOFOL N/A 10/28/2015   Procedure: COLONOSCOPY WITH PROPOFOL;  Surgeon: Christena Deem, MD;  Location: [facility] ENDOSCOPY;  Service: Endoscopy;  Laterality: N/A;   COLONOSCOPY WITH PROPOFOL N/A 10/29/2015   Procedure: COLONOSCOPY WITH PROPOFOL;  Surgeon: Christena Deem, MD;  Location: [facility] ENDOSCOPY;  Service: Endoscopy;  Laterality: N/A;   CORONARY ULTRASOUND/IVUS N/A 02/26/2020   Procedure: Intravascular Ultrasound/IVUS;  Surgeon: Corky Crafts, MD;  Location: [facility] INVASIVE CV LAB;  Service: Cardiovascular;  Laterality: N/A;   CORONARY/GRAFT ACUTE MI REVASCULARIZATION N/A 02/26/2020   Procedure: Coronary/Graft Acute MI Revascularization;  Surgeon: Corky Crafts, MD;  Location: [facility] INVASIVE CV LAB;  Service: Cardiovascular;  Laterality: N/A;   DIALYSIS/PERMA CATHETER INSERTION Right 04/26/2019   Perm Cath    DIALYSIS/PERMA CATHETER INSERTION N/A 04/26/2019   Procedure: DIALYSIS/PERMA CATHETER INSERTION;  Surgeon: Annice Needy, MD;  Location: [name] INVASIVE CV LAB;  Service: Cardiovascular;  Laterality: N/A;   DIALYSIS/PERMA CATHETER INSERTION N/A 05/25/2021   Procedure: DIALYSIS/PERMA CATHETER INSERTION;  Surgeon: Annice Needy, MD;  Location: [name] INV[facility] CV [facility];  Service: Cardiovascular;  Laterality: N/A;   DIALYSIS/PERMA CATHETER INSERTION N/A 08/31/2021   Procedure: DIALYSIS/PERMA CATHETER INSERTION;  Surgeon: Annice Needy, MD;  Location: [name] INV[facility] CV [facility];  Service: Cardiovascular;  Laterality: N/A;   DIALYSIS/PERMA CATHETER REMOVAL N/A 08/15/2019   Procedure: DIALYSIS/PERMA CATHETER REMOVAL;  Surgeon: Renford Dills, MD;  Location: [name] INVASIVE CV LAB;  Service: Cardiovascular;  Laterality: N/A;   DIALYSIS/PERMA CATHETER REMOVAL N/A 03/15/2022   Procedure: DIALYSIS/PERMA CATHETER REMOVAL;  Surgeon: Annice Needy, MD;  Location: [name] INVAS[facility] CV LAB;  Service: Cardiovascular;  Laterality: N/A;   EMBOLIZATION (CATH LAB)  Right 02/06/2021   Procedure: EMBOLIZATION;  Surgeon: Annice Needy, MD;  Location: [name] INVASIVE CV LAB;  Service: Cardiovascular;  Laterality: Right;  Right Upper Extremity Dialysis Access, Permcath Placement   ESOPHAGOGASTRODUODENOSCOPY (EGD) WITH PROPOFOL N/A 12/27/2017   Procedure: ESOPHAGOGASTRODUODENOSCOPY (EGD) WITH PROPOFOL;  Surgeon: Tol[place], Boykin Nearing, MD;  Location: [name] ENDOSCOPY;  Service: Gastroenterology;  Laterality: N/A;   EYE SURGERY     LEFT HEART CATH AND CORONARY ANGIOGRAPHY N/A 02/26/2020   Procedure: LEFT HEART CATH AND CORONARY ANGIOGRAPHY;  Surgeon: Corky Crafts, MD;  Location: [facility] INVASIVE CV LAB;  Service: Cardiovascular;  Laterality: N/A;   PROSTATE SURGERY  2016   REVISON OF ARTERIOVENOUS FISTULA Right 07/22/2022   Procedure: RESECTION OF ANEURYSMAL RIGHT ARM ARTERIOVENOUS FISTULA;  Surgeon: Annice Needy, MD;  Location: [name] ORS;  Service: Vascular;  Laterality: Right;   TONSILECTOMY/ADENOIDECTOMY WITH MYRINGOTOMY  TONSILLECTOMY     UPPER EXTREMITY ANGIOGRAPHY Right 11/26/2020   Procedure: Upper Extremity Angiography;  Surgeon: Annice Needy, MD;  Location: [name] INVASIVE CV LAB;  Service: Cardiovascular;  Laterality: Right;   UPPER EXTREMITY ANGIOGRAPHY Right 02/05/2021   Procedure: UPPER EXTREMITY ANGIOGRAPHY;  Surgeon: Annice Needy, MD;  Location: [name] INVASIVE CV LAB;  Service: Cardiovascular;  Laterality: Right;    Current Outpatient Medications  Medication Sig Dispense Refill   acetaminophen (TYLENOL) 325 MG tablet Take 1-2 tablets (325-650 mg total) by mouth every 4 (four) hours as needed for mild pain.     amiodarone (PACERONE) 200 MG tablet Take 1 tablet (200 mg total) by mouth every morning. 90 tablet 2   amLODipine (NORVASC) 10 MG tablet Take 1 tablet (10 mg total) by mouth daily. (Patient taking differently: Take 5 mg by mouth daily.) 30 tablet 2   ammonium lactate (AMLACTIN) 12 % cream Apply 1 Application topically as needed for dry skin. 385 g 0    amphetamine-dextroamphetamine (ADDERALL XR) 5 MG 24 hr capsule Take 5 mg by mouth daily.     atorvastatin (LIPITOR) 80 MG tablet TAKE ONE TABLET BY MOUTH AT BEDTIME. 90 tablet 3   calcium acetate (PHOSLO) 667 MG capsule Take 2 capsules (1,334 mg total) by mouth 3 (three) times daily with meals. 180 capsule 0   carvedilol (COREG) 25 MG tablet Take 25 mg by mouth 2 (two) times daily.     fluticasone (FLONASE) 50 MCG/ACT nasal spray Place 2 sprays into both nostrils daily. 16 g 0   hydrALAZINE (APRESOLINE) 25 MG tablet Take 25 mg by mouth 3 (three) times daily.     hydrocortisone (ANUSOL-HC) 2.5 % rectal cream Place 1 Application rectally 2 (two) times daily. 30 g 0   levETIRAcetam (KEPPRA) 250 MG tablet Take 1 tablet (250 mg total) by mouth Every Tuesday,Thursday,and Saturday with dialysis. 15 tablet 1   levETIRAcetam (KEPPRA) 500 MG tablet Take 1 tablet (500 mg total) by mouth daily after breakfast. 30 tablet 1   lidocaine-prilocaine (EMLA) cream Apply topically.     lipase/protease/amylase (CREON) 36000 UNITS CPEP capsule Take 2 capsules (72,000 Units total) by mouth 3 (three) times daily with meals AND 1 capsule (36,000 Units total) with snacks. 240 capsule 11   nitroGLYCERIN (NITROSTAT) 0.4 MG SL tablet Place 1 tablet (0.4 mg total) under the tongue every 5 (five) minutes as needed for chest pain. 25 tablet 6   No current facility-administered medications for this visit.    Allergies as of 12/30/2022 - Review Complete 12/30/2022  Allergen Reaction Noted   Promethazine Diarrhea and Other (See Comments) 03/06/2015   Promethazine hcl Other (See Comments) 03/06/2015   Other  05/29/2020    Family History  Problem Relation Age of Onset   CAD Father    Stroke Father    Diabetes Mellitus II Mother    Kidney failure Mother    Schizophrenia Mother     Social History   Socioeconomic History   Marital status: Married    Spouse name: Martie Lee    Number of children: 2   Years of  education: Not on file   Highest education level: High school graduate  Occupational History   Occupation: Disability    Comment: not employed  Tobacco Use   Smoking status: Never   Smokeless tobacco: Never  Vaping Use   Vaping status: Never Used  Substance and Sexual Activity   Alcohol use: No   Drug use: No  Sexual activity: Yes  Other Topics Concern   Not on file  Social History Narrative   Oldest son killed in car crash June 2020.  Lives in [place] with his wife.   Social Determinants of Health   Financial Resource Strain: High Risk (07/05/2017)   Overall Financial Resource Strain (CARDIA)    Difficulty of Paying Living Expenses: Hard  Food Insecurity: No Food Insecurity (02/28/2022)   Hunger Vital Sign    Worried About Running Out of Food in the Last Year: Never true    Ran Out of Food in the Last Year: Never true  Transportation Needs: No Transportation Needs (02/28/2022)   PRAPARE - Administrator, Civil Service (Medical): No    Lack of Transportation (Non-Medical): No  Physical Activity: Inactive (07/05/2017)   Exercise Vital Sign    Days of Exercise per Week: 0 days    Minutes of Exercise per Session: 0 min  Stress: Stress Concern Present (07/05/2017)   [employer] of Occupational Health - Occupational Stress Questionnaire    Feeling of Stress : Rather much  Social Connections: Moderately Integrated (07/05/2017)   Social Connection and Isolation Panel [NHANES]    Frequency of Communication with Friends and Family: Three times a week    Frequency of Social Gatherings with Friends and Family: Once a week    Attends Religious Services: More than 4 times per year    Active Member of [employer] or Organizations: No    Attends Engineer, structural: Never    Marital Status: Married   Review of systems General: negative for malaise, night sweats, fever, chills +weight loss  Neck: Negative for lumps, goiter, pain and significant neck  swelling Resp: Negative for cough, wheezing, dyspnea at rest CV: Negative for chest pain, leg swelling, palpitations, orthopnea GI: denies melena, hematochezia, nausea, vomiting,constipation, dysphagia, odyonophagia, early satiety +weight loss +diarrhea +rectal pain The remainder of the review of systems is noncontributory.  Physical Exam: BP (!) 102/59 (BP Location: Left Arm, Patient Position: Sitting, Cuff Size: Normal)   Pulse 62   Temp (!) 97.3 F (36.3 C) (Temporal)   Ht 6\' 3"  (1.905 m)   Wt 182 lb (82.6 kg)   BMI 22.75 kg/m  General:   Alert and oriented. No distress noted. Pleasant and cooperative.  Head:  Normocephalic and atraumatic. Eyes:  Conjuctiva clear without scleral icterus. Mouth:  Oral mucosa pink and moist. Good dentition. No lesions. Heart: Normal rate and rhythm, s1 and s2 heart sounds present.  Lungs: Clear lung sounds in all lobes. Respirations equal and unlabored. Abdomen:  +BS, soft, non-tender and non-distended. No rebound or guarding. No HSM or masses noted. Derm: No palmar erythema or jaundice Msk:  Symmetrical without gross deformities. Normal posture. Extremities:  Without edema. Neurologic:  Alert and  oriented x4 Psych:  Alert and cooperative. Normal mood and affect.  Invalid input(s): "6 MONTHS"   ASSESSMENT: [name]. is a 52 y.o. male presenting today for follow up of diarrhea and rectal pain  Ongoing diarrhea for the past few years though patient reports continuing to lose weight. He has tried multiple therapies, as above. He has no abdominal pain, rectal bleeding or melena. Appetite is poor. He has significant rectal pain. Has been treated for hemorrhoids without improvement. No obvious lesions or masses on last rectal exam though he notes so much pain he has difficulty sitting down. At this time, etiology of his symptoms is unclear, diarrhea may be secondary to history  of dumping syndrome/malabsorption, pancreatic elastase was low in  the past though creon has not improved his symptoms. Very low suspicion for mesenteric ischemia given no abdominal pain. CT imaging in early 2023 without any obvious findings to explain symptoms. Rectal pain could be secondary to known hemorrhoids in setting of frequent BMs, however, would recommend proceeding with colonoscopy with random colonic biopsies for further evaluation of his symptoms. Indications, risks and benefits of procedure discussed in detail with patient. Patient verbalized understanding and is in agreement to proceed with Colonoscopy.      PLAN:  Schedule colonoscopy ASA III 2. Small meals throughout the day 3. Keep rectal area clean and dry, sitz baths PRN  All questions were answered, patient verbalized understanding and is in agreement with plan as outlined above.    Follow Up: 3 months   Da Authement L. Jeanmarie Hubert, MSN, APRN, AGNP-C Adult-Gerontology Nurse Practitioner [facility]

## 2022-12-30 NOTE — Patient Instructions (Addendum)
We will get you scheduled for colonoscopy for further evaluation of your symptoms Try to eat smaller meals throughout the day as you are able  Follow up 3 months

## 2022-12-30 NOTE — H&P (View-Only) (Signed)
 Referring Provider: [name], FNP Primary Care Physician:  [name], FNP Primary GI Physician: Dr. Tasia Catchings   Chief Complaint  Patient presents with   Follow-up    Patient here today for a follow up on weight loss. He is still having diarrhea. When has a bowel movement puts pressure on his groin area. He wants to do a Colonoscopy.   HPI:   [name]. is a 52 y.o. male with past medical history of  ESRD on HD t/th/s, cardiac arrest, cerebral hemorrhage, DDD, DM, gastroparesis, HLD, HTN, NSTEMI, seizure, CHF, afib   Patient presenting today for follow up of diarrhea and rectal pain  Last seen August 2024, at that time, patient presenting for rectal pain, pressure and diarrhea. 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 Tried tucks, anusol without improvement.   Patient notably had previous testing at prior GI with low fecal elastase, recommended to start creon, [place] [facility] hemorrhoid cream, keep rectal area clean and dry  Present:  Patient states that he continues to have diarrhea everytime he eats. Notes this since his surgery, as above. Has mild abdominal discomfort but reports mostly rectal pain and pressure. He denies blood in stools or melena. Denies any improvement with use of creon which he took full prescription of. He denies any history of constipation. Appetite is not good. He is down about 6 pounds since August. He avoid eating due to diarrhea. He is not taking anything for his diarrhea. He has taken colestipol in the past that did not help either. Denies heartburn or acid reflux.   He has some nausea at times when he has not eaten. He notes he is very fatigued a lot as well.   Last TSH in February was 6.017. labs in may with potassium 3.8, sodium 143, calcium 8.5    Previous workup/treatments: Failed cholestyramine, lomotil, acarbose, creon and anti spasmodics    C Diff in  2023 Celiac panel, hydrogen breath test negative, calprotecin 135, calcium slightly low at 8.5 pancreatic elastase was 84  Biopsies on Colonoscopy in 2022 without microscopic colitis or evidence of IBD.  GES: 05/2021: delayed gastric emptying UGI series; 05/2021 normal Last Colonoscopy:10/2020 Preparation of the colon was inadequate. - Hemorrhoids found on perianal exam. - Congested mucosa in the entire examined colon. Query  congestive colopathy based on appearance and symptoms.  Biopsied to exclude microscopic colitis or other cause (Colonic mucosa with focal neutrophils in surface epithelium and lamina propria,No chronic colitis or granulomas are seen.) No evidence of lymphocytic or collagenous colitis is seen. of chronic diarrhea. - Diverticulosis in the sigmoid colon. - Non-bleeding internal hemorrhoids.  Last Endoscopy: 2024 Gastritis. Biopsied.  - Normal examined duodenum. Biopsied. Chronic inactive gastritis, no h pylori    Past Medical History:  Diagnosis Date   Acute bilateral cerebral infarction in a watershed distribution [facility]) 04/21/2021   a.) MRI brain 04/21/2021 --> patchy ACUTE cortically-based infarcts within the bilateral high frontal lobes and right parietal lobe   Acute cerebral infarction (HCC) 02/13/2021   a.) MRI brain 02/13/2021: acute LEFT hippocampal infarct   Acute cerebral infarction (HCC) 04/21/2021   a.) MRI brain 04/21/2021: subcentimeter ACUTE infarct within the callosal splenium on the LEFT   Anemia of chronic renal failure    Anxiety    Aortic dilatation (HCC) 11/25/2020   a.) TTE 11/25/2020: Ao root measured 38 mm. b.) TTE 04/22/2021: Ao root measured 44 mm; c. 11/2021 Echo:  Ao root 40mm.   Atrial fibrillation (HCC)    a.) CHA2DS2-VASc = 6 (CHF, HTN, CVA x2, prior MI, T2DM). b.) rate/rhythm maintained on oral amiodarone + carvedilol; chronic OAC/AP therapy discontinued following ICH   BPH (benign prostatic hyperplasia)    Cardiac arrest (HCC)  07/26/2021   a.) in setting of hyperkalemia, NSTEMI, and seizure following missed HD; pulseless and apneic --> required 1 round of CPR prior to ROSC   Cellulitis    Cerebral hemorrhage (HCC) 11/21/2021   a.) CT head 11/21/2021 --> 1.4 x 1.5 x 3.1 cm ACUTE hemorrhage within the inferior aspect of the fourth ventricle, and extending inferiorly through the foramen of foramen of [place] --> occurred in setting of HTN emergency and DOAC/APT-->eliquis/plavix d/c'd.   Cerebral microvascular disease    Chronic diarrhea    Chronic heart failure with preserved ejection fraction (HFpEF) (HCC) 07/19/2017   a.) 06/2017 Echo: EF 75%; b.) 11/2020 Echo: EF 55-60%; c.) 04/2021 Echo: EF 55-60%; d.) 11/2021 Echo: EF 65-70%, GrII DD, nl RV fxn, sev dil LA, large circumferential pericardial eff w/o tamponade, triv MR, AoV sclerosis. Ao root 40mm; e.) TTE 02/28/2025: EF 60-65%, sev LVH, mod posterior LV effusion, AoV sclerosis   Chronic pain of both ankles    Coronary artery disease 02/26/2020   a. 02/2020 Ant STEMI/PCI: LM nl, LAD 62m (3.5x30 Resolute Onyx), RI nl, LCX min irregs, RCA min irregs. EF 65%.   DDD (degenerative disc disease), cervical    Diabetes mellitus, type II (HCC)    Diabetic neuropathy (HCC)    Esophagitis    ESRD (end stage renal disease) on dialysis (HCC)    a.) [facility]; T-Th-Sat   Frequent falls    Gait instability    Gastritis    Gastroparesis    GERD (gastroesophageal reflux disease)    H/O enucleation of left eyeball    Heart palpitations    History of kidney stones    HLD (hyperlipidemia)    Hypertension    Insomnia    a.) uses melatonin PRN   Long term current use of amiodarone    Nausea and vomiting in adult    recurrent   NSTEMI (non-ST elevated myocardial infarction) (HCC) 07/26/2021   a.) hyperkalemic from missed HD; seizures; decompensated to cardiac arrest and required CPR x 1 round prior to ROSC.   Osteomyelitis (HCC)    Pericardial effusion    a. 11/2021 Echo: EF  65-70%, GrII DD, nl RV fxn, sev dil LA, large circumferential pericardial eff w/o tamponade, triv MR, AoV sclerosis. Ao root 40mm.   Seizure (HCC)    a.) last 07/26/2021 in setting of missed HD --> hyperkalemic at 6.5 --> pulseless/apneic and required CPR; discharged home on levetiracetam.   ST elevation myocardial infarction (STEMI) of anterior wall (HCC) 02/26/2020   a.) LHC/PCI 02/26/2020 --> EF 65%; LVEDP 11 mmHg; 90% mLAD (3.5 x 20 mm Resolute Onyx DES x 1)    Past Surgical History:  Procedure Laterality Date   A/V FISTULAGRAM Left 08/31/2021   Procedure: A/V Fistulagram;  Surgeon: Annice Needy, MD;  Location: [name] INVASIVE CV LAB;  Service: Cardiovascular;  Laterality: Left;   A/V FISTULAGRAM Left 01/25/2022   Procedure: A/V Fistulagram;  Surgeon: Annice Needy, MD;  Location: [name] INVASIVE CV LAB;  Service: Cardiovascular;  Laterality: Left;   A/V FISTULAGRAM Right 10/18/2022   Procedure: A/V Fistulagram;  Surgeon: Annice Needy, MD;  Location: [name] INVASIVE CV LAB;  Service: Cardiovascular;  Laterality: Right;  AMPUTATION Left 03/16/2016   Procedure: AMPUTATION DIGIT LEFT HALLUX;  Surgeon: Vivi Barrack, DPM;  Location: [facility] OR;  Service: Podiatry;  Laterality: Left;  can start around 5    AMPUTATION Right 02/09/2021   Procedure: AMPUTATION DIGIT;  Surgeon: Marlyne Beards, MD;  Location: [facility] OR;  Service: Orthopedics;  Laterality: Right;   AMPUTATION TOE Right 02/28/2022   Procedure: AMPUTATION TOE;  Surgeon: Louann Sjogren, DPM;  Location: [name] ORS;  Service: Podiatry;  Laterality: Right;  right fifth toe   ARTHROSCOPIC REPAIR ACL Left    AV FISTULA PLACEMENT Right 05/31/2019   Procedure: Brachiocephalic AV fistula creation;  Surgeon: Annice Needy, MD;  Location: [name] ORS;  Service: Vascular;  Laterality: Right;   AV FISTULA PLACEMENT Left 08/13/2021   Procedure: ARTERIOVENOUS (AV) FISTULA CREATION (BRACHIALCEPHALIC);  Surgeon: Annice Needy, MD;  Location: [name] ORS;  Service:  Vascular;  Laterality: Left;   COLONOSCOPY WITH PROPOFOL N/A 10/28/2015   Procedure: COLONOSCOPY WITH PROPOFOL;  Surgeon: Christena Deem, MD;  Location: [facility] ENDOSCOPY;  Service: Endoscopy;  Laterality: N/A;   COLONOSCOPY WITH PROPOFOL N/A 10/29/2015   Procedure: COLONOSCOPY WITH PROPOFOL;  Surgeon: Christena Deem, MD;  Location: [facility] ENDOSCOPY;  Service: Endoscopy;  Laterality: N/A;   CORONARY ULTRASOUND/IVUS N/A 02/26/2020   Procedure: Intravascular Ultrasound/IVUS;  Surgeon: Corky Crafts, MD;  Location: [facility] INVASIVE CV LAB;  Service: Cardiovascular;  Laterality: N/A;   CORONARY/GRAFT ACUTE MI REVASCULARIZATION N/A 02/26/2020   Procedure: Coronary/Graft Acute MI Revascularization;  Surgeon: Corky Crafts, MD;  Location: [facility] INVASIVE CV LAB;  Service: Cardiovascular;  Laterality: N/A;   DIALYSIS/PERMA CATHETER INSERTION Right 04/26/2019   Perm Cath    DIALYSIS/PERMA CATHETER INSERTION N/A 04/26/2019   Procedure: DIALYSIS/PERMA CATHETER INSERTION;  Surgeon: Annice Needy, MD;  Location: [name] INVASIVE CV LAB;  Service: Cardiovascular;  Laterality: N/A;   DIALYSIS/PERMA CATHETER INSERTION N/A 05/25/2021   Procedure: DIALYSIS/PERMA CATHETER INSERTION;  Surgeon: Annice Needy, MD;  Location: [name] INV[facility] CV [facility];  Service: Cardiovascular;  Laterality: N/A;   DIALYSIS/PERMA CATHETER INSERTION N/A 08/31/2021   Procedure: DIALYSIS/PERMA CATHETER INSERTION;  Surgeon: Annice Needy, MD;  Location: [name] INV[facility] CV [facility];  Service: Cardiovascular;  Laterality: N/A;   DIALYSIS/PERMA CATHETER REMOVAL N/A 08/15/2019   Procedure: DIALYSIS/PERMA CATHETER REMOVAL;  Surgeon: Renford Dills, MD;  Location: [name] INVASIVE CV LAB;  Service: Cardiovascular;  Laterality: N/A;   DIALYSIS/PERMA CATHETER REMOVAL N/A 03/15/2022   Procedure: DIALYSIS/PERMA CATHETER REMOVAL;  Surgeon: Annice Needy, MD;  Location: [name] INVASIVE CV LAB;  Service: Cardiovascular;  Laterality: N/A;   EMBOLIZATION (CATH LAB)  Right 02/06/2021   Procedure: EMBOLIZATION;  Surgeon: Annice Needy, MD;  Location: [name] INVASIVE CV LAB;  Service: Cardiovascular;  Laterality: Right;  Right Upper Extremity Dialysis Access, Permcath Placement   ESOPHAGOGASTRODUODENOSCOPY (EGD) WITH PROPOFOL N/A 12/27/2017   Procedure: ESOPHAGOGASTRODUODENOSCOPY (EGD) WITH PROPOFOL;  Surgeon: Tol[place], Boykin Nearing, MD;  Location: [name] ENDOSCOPY;  Service: Gastroenterology;  Laterality: N/A;   EYE SURGERY     LEFT HEART CATH AND CORONARY ANGIOGRAPHY N/A 02/26/2020   Procedure: LEFT HEART CATH AND CORONARY ANGIOGRAPHY;  Surgeon: Corky Crafts, MD;  Location: [facility] INVASIVE CV LAB;  Service: Cardiovascular;  Laterality: N/A;   PROSTATE SURGERY  2016   REVISON OF ARTERIOVENOUS FISTULA Right 07/22/2022   Procedure: RESECTION OF ANEURYSMAL RIGHT ARM ARTERIOVENOUS FISTULA;  Surgeon: Annice Needy, MD;  Location: [name] ORS;  Service: Vascular;  Laterality: Right;   TONSILECTOMY/ADENOIDECTOMY WITH MYRINGOTOMY  TONSILLECTOMY     UPPER EXTREMITY ANGIOGRAPHY Right 11/26/2020   Procedure: Upper Extremity Angiography;  Surgeon: Annice Needy, MD;  Location: [name] INVASIVE CV LAB;  Service: Cardiovascular;  Laterality: Right;   UPPER EXTREMITY ANGIOGRAPHY Right 02/05/2021   Procedure: UPPER EXTREMITY ANGIOGRAPHY;  Surgeon: Annice Needy, MD;  Location: [name] INVASIVE CV LAB;  Service: Cardiovascular;  Laterality: Right;    Current Outpatient Medications  Medication Sig Dispense Refill   acetaminophen (TYLENOL) 325 MG tablet Take 1-2 tablets (325-650 mg total) by mouth every 4 (four) hours as needed for mild pain.     amiodarone (PACERONE) 200 MG tablet Take 1 tablet (200 mg total) by mouth every morning. 90 tablet 2   amLODipine (NORVASC) 10 MG tablet Take 1 tablet (10 mg total) by mouth daily. (Patient taking differently: Take 5 mg by mouth daily.) 30 tablet 2   ammonium lactate (AMLACTIN) 12 % cream Apply 1 Application topically as needed for dry skin. 385 g 0    amphetamine-dextroamphetamine (ADDERALL XR) 5 MG 24 hr capsule Take 5 mg by mouth daily.     atorvastatin (LIPITOR) 80 MG tablet TAKE ONE TABLET BY MOUTH AT BEDTIME. 90 tablet 3   calcium acetate (PHOSLO) 667 MG capsule Take 2 capsules (1,334 mg total) by mouth 3 (three) times daily with meals. 180 capsule 0   carvedilol (COREG) 25 MG tablet Take 25 mg by mouth 2 (two) times daily.     fluticasone (FLONASE) 50 MCG/ACT nasal spray Place 2 sprays into both nostrils daily. 16 g 0   hydrALAZINE (APRESOLINE) 25 MG tablet Take 25 mg by mouth 3 (three) times daily.     hydrocortisone (ANUSOL-HC) 2.5 % rectal cream Place 1 Application rectally 2 (two) times daily. 30 g 0   levETIRAcetam (KEPPRA) 250 MG tablet Take 1 tablet (250 mg total) by mouth Every Tuesday,Thursday,and Saturday with dialysis. 15 tablet 1   levETIRAcetam (KEPPRA) 500 MG tablet Take 1 tablet (500 mg total) by mouth daily after breakfast. 30 tablet 1   lidocaine-prilocaine (EMLA) cream Apply topically.     lipase/protease/amylase (CREON) 36000 UNITS CPEP capsule Take 2 capsules (72,000 Units total) by mouth 3 (three) times daily with meals AND 1 capsule (36,000 Units total) with snacks. 240 capsule 11   nitroGLYCERIN (NITROSTAT) 0.4 MG SL tablet Place 1 tablet (0.4 mg total) under the tongue every 5 (five) minutes as needed for chest pain. 25 tablet 6   No current facility-administered medications for this visit.    Allergies as of 12/30/2022 - Review Complete 12/30/2022  Allergen Reaction Noted   Promethazine Diarrhea and Other (See Comments) 03/06/2015   Promethazine hcl Other (See Comments) 03/06/2015   Other  05/29/2020    Family History  Problem Relation Age of Onset   CAD Father    Stroke Father    Diabetes Mellitus II Mother    Kidney failure Mother    Schizophrenia Mother     Social History   Socioeconomic History   Marital status: Married    Spouse name: Martie Lee    Number of children: 2   Years of  education: Not on file   Highest education level: High school graduate  Occupational History   Occupation: Disability    Comment: not employed  Tobacco Use   Smoking status: Never   Smokeless tobacco: Never  Vaping Use   Vaping status: Never Used  Substance and Sexual Activity   Alcohol use: No   Drug use: No  Sexual activity: Yes  Other Topics Concern   Not on file  Social History Narrative   Oldest son killed in car crash June 2020.  Lives in [place] with his wife.   Social Determinants of Health   Financial Resource Strain: High Risk (07/05/2017)   Overall Financial Resource Strain (CARDIA)    Difficulty of Paying Living Expenses: Hard  Food Insecurity: No Food Insecurity (02/28/2022)   Hunger Vital Sign    Worried About Running Out of Food in the Last Year: Never true    Ran Out of Food in the Last Year: Never true  Transportation Needs: No Transportation Needs (02/28/2022)   PRAPARE - Administrator, Civil Service (Medical): No    Lack of Transportation (Non-Medical): No  Physical Activity: Inactive (07/05/2017)   Exercise Vital Sign    Days of Exercise per Week: 0 days    Minutes of Exercise per Session: 0 min  Stress: Stress Concern Present (07/05/2017)   [employer] of Occupational Health - Occupational Stress Questionnaire    Feeling of Stress : Rather much  Social Connections: Moderately Integrated (07/05/2017)   Social Connection and Isolation Panel [NHANES]    Frequency of Communication with Friends and Family: Three times a week    Frequency of Social Gatherings with Friends and Family: Once a week    Attends Religious Services: More than 4 times per year    Active Member of [employer] or Organizations: No    Attends Engineer, structural: Never    Marital Status: Married   Review of systems General: negative for malaise, night sweats, fever, chills +weight loss  Neck: Negative for lumps, goiter, pain and significant neck  swelling Resp: Negative for cough, wheezing, dyspnea at rest CV: Negative for chest pain, leg swelling, palpitations, orthopnea GI: denies melena, hematochezia, nausea, vomiting,constipation, dysphagia, odyonophagia, early satiety +weight loss +diarrhea +rectal pain The remainder of the review of systems is noncontributory.  Physical Exam: BP (!) 102/59 (BP Location: Left Arm, Patient Position: Sitting, Cuff Size: Normal)   Pulse 62   Temp (!) 97.3 F (36.3 C) (Temporal)   Ht 6\' 3"  (1.905 m)   Wt 182 lb (82.6 kg)   BMI 22.75 kg/m  General:   Alert and oriented. No distress noted. Pleasant and cooperative.  Head:  Normocephalic and atraumatic. Eyes:  Conjuctiva clear without scleral icterus. Mouth:  Oral mucosa pink and moist. Good dentition. No lesions. Heart: Normal rate and rhythm, s1 and s2 heart sounds present.  Lungs: Clear lung sounds in all lobes. Respirations equal and unlabored. Abdomen:  +BS, soft, non-tender and non-distended. No rebound or guarding. No HSM or masses noted. Derm: No palmar erythema or jaundice Msk:  Symmetrical without gross deformities. Normal posture. Extremities:  Without edema. Neurologic:  Alert and  oriented x4 Psych:  Alert and cooperative. Normal mood and affect.  Invalid input(s): "6 MONTHS"   ASSESSMENT: Kardell Jeremyah Guglielmo. is a 52 y.o. male presenting today for follow up of diarrhea and rectal pain  Ongoing diarrhea for the past few years though patient reports continuing to lose weight. He has tried multiple therapies, as above. He has no abdominal pain, rectal bleeding or melena. Appetite is poor. He has significant rectal pain. Has been treated for hemorrhoids without improvement. No obvious lesions or masses on last rectal exam though he notes so much pain he has difficulty sitting down. At this time, etiology of his symptoms is unclear, diarrhea may be secondary to history  of dumping syndrome/malabsorption, pancreatic elastase was low in  the past though creon has not improved his symptoms. Very low suspicion for mesenteric ischemia given no abdominal pain. CT imaging in early 2023 without any obvious findings to explain symptoms. Rectal pain could be secondary to known hemorrhoids in setting of frequent BMs, however, would recommend proceeding with colonoscopy with random colonic biopsies for further evaluation of his symptoms. Indications, risks and benefits of procedure discussed in detail with patient. Patient verbalized understanding and is in agreement to proceed with Colonoscopy.      PLAN:  Schedule colonoscopy ASA III 2. Small meals throughout the day 3. Keep rectal area clean and dry, sitz baths PRN  All questions were answered, patient verbalized understanding and is in agreement with plan as outlined above.    Follow Up: 3 months   Da Authement L. Jeanmarie Hubert, MSN, APRN, AGNP-C Adult-Gerontology Nurse Practitioner [facility]

## 2022-12-30 NOTE — Telephone Encounter (Signed)
PA approved via [facility]here for colonoscopy  Authorization #[id number] DOS:  01/27/2023 - 02/22/2023

## 2023-01-05 NOTE — Progress Notes (Deleted)
Cardiology Office Note    Patient Name: [name]. Date of Encounter: 01/05/2023  Primary Care Provider:  [name], FNP Primary Cardiologist:  Lance Muss, MD Primary Electrophysiologist: None   Past Medical History    Past Medical History:  Diagnosis Date   Acute bilateral cerebral infarction in a watershed distribution [facility]) 04/21/2021   a.) MRI brain 04/21/2021 --> patchy ACUTE cortically-based infarcts within the bilateral high frontal lobes and right parietal lobe   Acute cerebral infarction (HCC) 02/13/2021   a.) MRI brain 02/13/2021: acute LEFT hippocampal infarct   Acute cerebral infarction (HCC) 04/21/2021   a.) MRI brain 04/21/2021: subcentimeter ACUTE infarct within the callosal splenium on the LEFT   Anemia of chronic renal failure    Anxiety    Aortic dilatation (HCC) 11/25/2020   a.) TTE 11/25/2020: Ao root measured 38 mm. b.) TTE 04/22/2021: Ao root measured 44 mm; c. 11/2021 Echo: Ao root 40mm.   Atrial fibrillation (HCC)    a.) CHA2DS2-VASc = 6 (CHF, HTN, CVA x2, prior MI, T2DM). b.) rate/rhythm maintained on oral amiodarone + carvedilol; chronic OAC/AP therapy discontinued following ICH   BPH (benign prostatic hyperplasia)    Cardiac arrest (HCC) 07/26/2021   a.) in setting of hyperkalemia, NSTEMI, and seizure following missed HD; pulseless and apneic --> required 1 round of CPR prior to ROSC   Cellulitis    Cerebral hemorrhage (HCC) 11/21/2021   a.) CT head 11/21/2021 --> 1.4 x 1.5 x 3.1 cm ACUTE hemorrhage within the inferior aspect of the fourth ventricle, and extending inferiorly through the foramen of foramen of [place] --> occurred in setting of HTN emergency and DOAC/APT-->eliquis/plavix d/c'd.   Cerebral microvascular disease    Chronic diarrhea    Chronic heart failure with preserved ejection fraction (HFpEF) (HCC) 07/19/2017   a.) 06/2017 Echo: EF 75%; b.) 11/2020 Echo: EF 55-60%; c.) 04/2021 Echo: EF 55-60%; d.) 11/2021 Echo: EF  65-70%, GrII DD, nl RV fxn, sev dil LA, large circumferential pericardial eff w/o tamponade, triv MR, AoV sclerosis. Ao root 40mm; e.) TTE 02/28/2025: EF 60-65%, sev LVH, mod posterior LV effusion, AoV sclerosis   Chronic pain of both ankles    Coronary artery disease 02/26/2020   a. 02/2020 Ant STEMI/PCI: LM nl, LAD 50m (3.5x30 Resolute Onyx), RI nl, LCX min irregs, RCA min irregs. EF 65%.   DDD (degenerative disc disease), cervical    Diabetes mellitus, type II (HCC)    Diabetic neuropathy (HCC)    Esophagitis    ESRD (end stage renal disease) on dialysis (HCC)    a.) [facility]; T-Th-Sat   Frequent falls    Gait instability    Gastritis    Gastroparesis    GERD (gastroesophageal reflux disease)    H/O enucleation of left eyeball    Heart palpitations    History of kidney stones    HLD (hyperlipidemia)    Hypertension    Insomnia    a.) uses melatonin PRN   Long term current use of amiodarone    Nausea and vomiting in adult    recurrent   NSTEMI (non-ST elevated myocardial infarction) (HCC) 07/26/2021   a.) hyperkalemic from missed HD; seizures; decompensated to cardiac arrest and required CPR x 1 round prior to ROSC.   Osteomyelitis (HCC)    Pericardial effusion    a. 11/2021 Echo: EF 65-70%, GrII DD, nl RV fxn, sev dil LA, large circumferential pericardial eff w/o tamponade, triv MR, AoV sclerosis. Ao root 40mm.   Seizure (  HCC)    a.) last 07/26/2021 in setting of missed HD --> hyperkalemic at 6.5 --> pulseless/apneic and required CPR; discharged home on levetiracetam.   ST elevation myocardial infarction (STEMI) of anterior wall (HCC) 02/26/2020   a.) LHC/PCI 02/26/2020 --> EF 65%; LVEDP 11 mmHg; 90% mLAD (3.5 x 20 mm Resolute Onyx DES x 1)    History of Present Illness  [name].  is a 52 year old male with a PMH of CAD s/p anterior STEMI with DES to mid LAD 02/2020, paroxysmal AF (not on AC) ESRD, HTN, HLD, gastroparesis, diastolic dysfunction, CVA 01/2021, GERD, dry  gangrene, chronic anemia, medical noncompliance who presents today for 25-month follow-up.  Mr. Maat was last seen on 10/08/2022 for 75-month follow-up.  During visit patient reported that he has been experiencing hypotension during dialysis as well as spikes of BP during his sessions.  He also noted gastroparesis and severe diarrhea that been causing severe hemorrhoids.  He was noted to have controlled BP and was not experiencing any chest pain.  2D echo was completed that showed EF of 60 to 65% with no RWMA and severe LVH with severely dilated LA and small pericardial effusion right atrium moderate pericardial effusion posterior to the left ventricle that has not increased in size.   During today's visit the patient reports*** .  Patient denies chest pain, palpitations, dyspnea, PND, orthopnea, nausea, vomiting, dizziness, syncope, edema, weight gain, or early satiety.  ***Notes: -Last ischemic evaluation: -Last echo: -Interim ED visits: Review of Systems  Please see the history of present illness.    All other systems reviewed and are otherwise negative except as noted above.  Physical Exam    Wt Readings from Last 3 Encounters:  12/30/22 182 lb (82.6 kg)  10/19/22 188 lb 1.6 oz (85.3 kg)  10/18/22 185 lb (83.9 kg)   WU:JWJXB were no vitals filed for this visit.,There is no height or weight on file to calculate BMI. GEN: Well nourished, well developed in no acute distress Neck: No JVD; No carotid bruits Pulmonary: Clear to auscultation without rales, wheezing or rhonchi  Cardiovascular: Normal rate. Regular rhythm. Normal S1. Normal S2.   Murmurs: There is no murmur.  ABDOMEN: Soft, non-tender, non-distended EXTREMITIES:  No edema; No deformity   EKG/LABS/ Recent Cardiac Studies   ECG personally reviewed by me today - ***  Risk Assessment/Calculations:   {Does this patient have ATRIAL FIBRILLATION?[phone]}      Lab Results  Component Value Date   WBC 6.4 07/22/2022    HGB 12.6 (L) 07/22/2022   HCT 37.0 (L) 07/22/2022   MCV 73.4 (L) 07/22/2022   PLT 94 (L) 07/22/2022   Lab Results  Component Value Date   CREATININE 13.60 (H) 07/22/2022   BUN 50 (H) 07/22/2022   NA 141 07/22/2022   K 3.8 07/22/2022   CL 111 07/22/2022   CO2 19 (L) 07/22/2022   Lab Results  Component Value Date   CHOL 94 11/21/2021   HDL 39 (L) 11/21/2021   LDLCALC 34 11/21/2021   TRIG 124 11/26/2021   CHOLHDL 2.4 11/21/2021    Lab Results  Component Value Date   HGBA1C 4.5 (L) 11/21/2021   Assessment & Plan    1.  Coronary artery disease: -s/p rior MI with DES to LAD 02/2020. NSTEMI with seizure and following hypokalemia that required 1 round of CPR with return of ROSC  -Today patient reports that he is not experienced any chest pain anginal equivalent. -Continue carvedilol  25 mg twice daily, Lipitor 80 mg daily, Nitrostat 0.4 mg as needed -Repeat 2D echo to evaluate pericardial effusion   2.  Paroxysmal AF: -Patient currently on low-dose amiodarone at 200 mg daily with controlled rate and no complaint of palpitations. -Continue Coreg 25 mg twice daily -CHA2DS2-VASc Score = 6 [CHF History: 1, HTN History: 1, Diabetes History: 1, Stroke History: 2, Vascular Disease History: 1, Age Score: 0, Gender Score: 0].  Therefore, the patient's annual risk of stroke is 9.7 %.       3.  Essential hypertension: -Patient's blood pressure today was controlled at 122/70 -Patient reports hypotension during dialysis and was encouraged to hold amlodipine, carvedilol, and hydralazine prior to his dialysis sessions. -Continue medications postdialysis session follow-up regular dosing schedule on nondialysis days.   4.  History of CVA/ICH: -History of multiple CVAs with most recent 01/2021 while holding OAC. -Continue GDMT with Lipitor 80 mg daily carvedilol 25 mg twice daily   5.  ESRD: -HD on Tu/Thu/Sat  Continue to follow nephrology and patient advised to hold above-mentioned medications  prior to dialysis session to offset hypotension. -Patient will also try consuming more fiber and low-carb heart healthy snack before and during session.      Disposition: Follow-up with Lance Muss, MD or APP in *** months {Are you ordering a CV Procedure (e.g. stress test, cath, DCCV, TEE, etc)?   Press F2        :[id number]}   Signed, Napoleon Form, Leodis Rains, NP 01/05/2023, 6:44 PM [facility]

## 2023-01-06 ENCOUNTER — Ambulatory Visit: Payer: Medicare HMO | Attending: Nurse Practitioner | Admitting: Nurse Practitioner

## 2023-01-06 DIAGNOSIS — I25118 Atherosclerotic heart disease of native coronary artery with other forms of angina pectoris: Secondary | ICD-10-CM

## 2023-01-06 DIAGNOSIS — I4891 Unspecified atrial fibrillation: Secondary | ICD-10-CM

## 2023-01-06 DIAGNOSIS — I10 Essential (primary) hypertension: Secondary | ICD-10-CM

## 2023-01-06 DIAGNOSIS — I639 Cerebral infarction, unspecified: Secondary | ICD-10-CM

## 2023-01-06 DIAGNOSIS — N186 End stage renal disease: Secondary | ICD-10-CM

## 2023-01-07 ENCOUNTER — Ambulatory Visit (INDEPENDENT_AMBULATORY_CARE_PROVIDER_SITE_OTHER): Payer: Medicare HMO | Admitting: Nurse Practitioner

## 2023-01-07 ENCOUNTER — Encounter (INDEPENDENT_AMBULATORY_CARE_PROVIDER_SITE_OTHER): Payer: Medicare HMO

## 2023-01-11 ENCOUNTER — Ambulatory Visit (INDEPENDENT_AMBULATORY_CARE_PROVIDER_SITE_OTHER): Payer: Medicare HMO | Admitting: Gastroenterology

## 2023-01-25 ENCOUNTER — Encounter (HOSPITAL_COMMUNITY)
Admission: RE | Admit: 2023-01-25 | Discharge: 2023-01-25 | Disposition: A | Discharge status: Home / Self Care | Payer: Medicare HMO | Source: Ambulatory Visit | Attending: Gastroenterology | Admitting: Gastroenterology

## 2023-01-25 ENCOUNTER — Other Ambulatory Visit: Payer: Self-pay

## 2023-01-25 ENCOUNTER — Encounter (HOSPITAL_COMMUNITY): Payer: Self-pay

## 2023-01-25 VITALS — Ht 75.0 in | Wt 182.1 lb

## 2023-01-25 DIAGNOSIS — Z992 Dependence on renal dialysis: Secondary | ICD-10-CM

## 2023-01-27 ENCOUNTER — Encounter (INDEPENDENT_AMBULATORY_CARE_PROVIDER_SITE_OTHER): Payer: Self-pay | Admitting: *Deleted

## 2023-01-27 ENCOUNTER — Ambulatory Visit (HOSPITAL_COMMUNITY)
Admission: RE | Admit: 2023-01-27 | Discharge: 2023-01-27 | Disposition: A | Discharge status: Home / Self Care | Payer: Medicare HMO | Source: Ambulatory Visit | Attending: Gastroenterology | Admitting: Gastroenterology

## 2023-01-27 ENCOUNTER — Ambulatory Visit (HOSPITAL_COMMUNITY): Payer: Medicare HMO | Admitting: Anesthesiology

## 2023-01-27 ENCOUNTER — Encounter (HOSPITAL_COMMUNITY)
Admission: RE | Disposition: A | Discharge status: Home / Self Care | Payer: Self-pay | Source: Ambulatory Visit | Attending: Gastroenterology

## 2023-01-27 ENCOUNTER — Encounter (HOSPITAL_COMMUNITY): Payer: Self-pay

## 2023-01-27 DIAGNOSIS — E785 Hyperlipidemia, unspecified: Secondary | ICD-10-CM | POA: Insufficient documentation

## 2023-01-27 DIAGNOSIS — R569 Unspecified convulsions: Secondary | ICD-10-CM | POA: Insufficient documentation

## 2023-01-27 DIAGNOSIS — K529 Noninfective gastroenteritis and colitis, unspecified: Secondary | ICD-10-CM | POA: Diagnosis present

## 2023-01-27 DIAGNOSIS — I5032 Chronic diastolic (congestive) heart failure: Secondary | ICD-10-CM | POA: Diagnosis not present

## 2023-01-27 DIAGNOSIS — R197 Diarrhea, unspecified: Secondary | ICD-10-CM

## 2023-01-27 DIAGNOSIS — Z8673 Personal history of transient ischemic attack (TIA), and cerebral infarction without residual deficits: Secondary | ICD-10-CM | POA: Insufficient documentation

## 2023-01-27 DIAGNOSIS — K6289 Other specified diseases of anus and rectum: Secondary | ICD-10-CM | POA: Insufficient documentation

## 2023-01-27 DIAGNOSIS — K3184 Gastroparesis: Secondary | ICD-10-CM | POA: Diagnosis not present

## 2023-01-27 DIAGNOSIS — E1143 Type 2 diabetes mellitus with diabetic autonomic (poly)neuropathy: Secondary | ICD-10-CM | POA: Diagnosis not present

## 2023-01-27 DIAGNOSIS — I2510 Atherosclerotic heart disease of native coronary artery without angina pectoris: Secondary | ICD-10-CM | POA: Diagnosis not present

## 2023-01-27 DIAGNOSIS — Z8679 Personal history of other diseases of the circulatory system: Secondary | ICD-10-CM | POA: Insufficient documentation

## 2023-01-27 DIAGNOSIS — E1122 Type 2 diabetes mellitus with diabetic chronic kidney disease: Secondary | ICD-10-CM | POA: Diagnosis not present

## 2023-01-27 DIAGNOSIS — K635 Polyp of colon: Secondary | ICD-10-CM | POA: Insufficient documentation

## 2023-01-27 DIAGNOSIS — K648 Other hemorrhoids: Secondary | ICD-10-CM | POA: Diagnosis not present

## 2023-01-27 DIAGNOSIS — Z992 Dependence on renal dialysis: Secondary | ICD-10-CM | POA: Insufficient documentation

## 2023-01-27 DIAGNOSIS — E1151 Type 2 diabetes mellitus with diabetic peripheral angiopathy without gangrene: Secondary | ICD-10-CM | POA: Insufficient documentation

## 2023-01-27 DIAGNOSIS — K6389 Other specified diseases of intestine: Secondary | ICD-10-CM | POA: Diagnosis not present

## 2023-01-27 DIAGNOSIS — I132 Hypertensive heart and chronic kidney disease with heart failure and with stage 5 chronic kidney disease, or end stage renal disease: Secondary | ICD-10-CM | POA: Diagnosis not present

## 2023-01-27 DIAGNOSIS — Z8674 Personal history of sudden cardiac arrest: Secondary | ICD-10-CM | POA: Insufficient documentation

## 2023-01-27 DIAGNOSIS — K644 Residual hemorrhoidal skin tags: Secondary | ICD-10-CM | POA: Diagnosis not present

## 2023-01-27 DIAGNOSIS — N186 End stage renal disease: Secondary | ICD-10-CM | POA: Insufficient documentation

## 2023-01-27 DIAGNOSIS — D631 Anemia in chronic kidney disease: Secondary | ICD-10-CM | POA: Diagnosis not present

## 2023-01-27 HISTORY — PX: COLONOSCOPY WITH PROPOFOL: SHX5780

## 2023-01-27 LAB — HM COLONOSCOPY

## 2023-01-27 LAB — POCT I-STAT, CHEM 8
BUN: 61 mg/dL — ABNORMAL HIGH (ref 6–20)
Calcium, Ion: 1.13 mmol/L — ABNORMAL LOW (ref 1.15–1.40)
Chloride: 105 mmol/L (ref 98–111)
Creatinine, Ser: 14.9 mg/dL — ABNORMAL HIGH (ref 0.61–1.24)
Glucose, Bld: 79 mg/dL (ref 70–99)
HCT: 30 % — ABNORMAL LOW (ref 39.0–52.0)
Hemoglobin: 10.2 g/dL — ABNORMAL LOW (ref 13.0–17.0)
Potassium: 4.1 mmol/L (ref 3.5–5.1)
Sodium: 139 mmol/L (ref 135–145)
TCO2: 19 mmol/L — ABNORMAL LOW (ref 22–32)

## 2023-01-27 SURGERY — COLONOSCOPY WITH PROPOFOL
Anesthesia: General

## 2023-01-27 MED ORDER — PROPOFOL 10 MG/ML IV BOLUS
INTRAVENOUS | Status: DC | PRN
  Administered 2023-01-27 (×2): 50 mg via INTRAVENOUS

## 2023-01-27 MED ORDER — PROPOFOL 500 MG/50ML IV EMUL
INTRAVENOUS | Status: DC | PRN
  Administered 2023-01-27: 200 ug/kg/min via INTRAVENOUS

## 2023-01-27 MED ORDER — PSYLLIUM 58.6 % PO PACK: 1.0000 | PACK | Freq: Two times a day (BID) | ORAL | 2 refills | Status: DC

## 2023-01-27 MED ORDER — LACTATED RINGERS IV SOLN: INTRAVENOUS | Status: DC

## 2023-01-27 MED ORDER — SODIUM CHLORIDE 0.9 % IV SOLN: INTRAVENOUS | Status: DC

## 2023-01-27 MED ORDER — POLYETHYLENE GLYCOL 3350 17 G PO PACK: 17.0000 g | PACK | Freq: Two times a day (BID) | ORAL | 0 refills | Status: DC

## 2023-01-27 NOTE — Transfer of Care (Signed)
Immediate Anesthesia Transfer of Care Note  Patient: [name].  Procedure(s) Performed: COLONOSCOPY WITH PROPOFOL  Patient Location: Short Stay  Anesthesia Type:General  Level of Consciousness: drowsy and responds to stimulation  Airway & Oxygen Therapy: Patient Spontanous Breathing  Post-op Assessment: Report given to RN, Post -op Vital signs reviewed and stable, and Patient moving all extremities X 4  Post vital signs: Reviewed and stable  Last Vitals:  Vitals Value Taken Time  BP 96/42 01/27/23 0954  Temp 36.5 C 01/27/23 0954  Pulse 48 01/27/23 0954  Resp 11 01/27/23 0954  SpO2 99 % 01/27/23 0954    Last Pain:  Vitals:   01/27/23 0954  TempSrc: Axillary  PainSc: 0-No pain      Patients Stated Pain Goal: 4 (01/27/23 0852)  Complications: No notable events documented.

## 2023-01-27 NOTE — Op Note (Signed)
[facility] Patient Name: [name] Procedure Date: 01/27/2023 9:20 AM MRN: [id number] Date of Birth: [birth year] Attending MD: Sanjuan Dame , MD, [id number] CSN: [id number] Age: 52 Admit Type: Outpatient Procedure:                Colonoscopy Indications:              Chronic diarrhea, Rectal pain Providers:                Sanjuan Dame, MD, Nena Polio, RN, Zena Amos, Elinor Parkinson Referring MD:              Medicines:                Monitored Anesthesia Care Complications:            No immediate complications. Estimated Blood Loss:     Estimated blood loss: none. Estimated blood loss:                            none. Procedure:                Pre-Anesthesia Assessment:                           - Prior to the procedure, a History and Physical                            was performed, and patient medications and                            allergies were reviewed. The patient's tolerance of                            previous anesthesia was also reviewed. The risks                            and benefits of the procedure and the sedation                            options and risks were discussed with the patient.                            All questions were answered, and informed consent                            was obtained. Prior Anticoagulants: The patient has                            taken no anticoagulant or antiplatelet agents. ASA                            Grade Assessment: III - A patient with severe                            systemic  disease. After reviewing the risks and                            benefits, the patient was deemed in satisfactory                            condition to undergo the procedure.                           After obtaining informed consent, the colonoscope                            was passed under direct vision. Throughout the                            procedure, the patient's blood  pressure, pulse, and                            oxygen saturations were monitored continuously. The                            [id number]) scope was introduced through the                            anus and advanced to the the cecum, identified by                            the appendiceal orifice. The colonoscopy was                            performed without difficulty. The patient tolerated                            the procedure well. The quality of the bowel                            preparation was poor. The appendiceal orifice and                            the rectum were photographed. The quality of the                            bowel preparation was evaluated using the BBPS                            [facility] Bowel Preparation Scale) with scores of:                            Right Colon = 1 (portion of mucosa seen, but other                            areas not well seen due to staining, residual stool  and/or opaque liquid), Transverse Colon = 1                            (portion of mucosa seen, but other areas not well                            seen due to staining, residual stool and/or opaque                            liquid) and Left Colon = 1 (portion of mucosa seen,                            but other areas not well seen due to staining,                            residual stool and/or opaque liquid). The total                            BBPS score equals 3. Scope In: 9:36:05 AM Scope Out: 9:47:25 AM Scope Withdrawal Time: 0 hours 6 minutes 29 seconds  Total Procedure Duration: 0 hours 11 minutes 20 seconds  Findings:      The perianal and digital rectal examinations were normal.      A large amount of stool was found in the entire colon, precluding       visualization. Lavage of the area was performed using a large amount of       sterile water, resulting in incomplete clearance with continued poor       visualization.      An  area of moderately congested mucosa was found in the entire colon.       Biopsies for histology were taken with a cold forceps for evaluation of       microscopic colitis.      Non-bleeding external and internal hemorrhoids were found. The       hemorrhoids were medium-sized. Impression:               - Preparation of the colon was poor.                           - Stool in the entire examined colon.                           - Congested mucosa in the entire examined colon.                            Biopsied.                           - Non-bleeding external and internal hemorrhoids. Moderate Sedation:      Per Anesthesia Care Recommendation:           - Patient has a contact number available for                            emergencies. The signs and symptoms of potential  delayed complications were discussed with the                            patient. Return to normal activities tomorrow.                            Written discharge instructions were provided to the                            patient.                           - Resume previous diet.                           - Continue present medications.                           - Await pathology results.                           - Repeat colonoscopy at next available appointment                            (within 3 months) because the bowel preparation was                            poor.                           - Return to GI clinic as previously scheduled.                           -This could be overflow diarrhea as large amount of                            stool seen despite bowel prep Procedure Code(s):        --- Professional ---                           [phone], Colonoscopy, flexible; with biopsy, single                            or multiple Diagnosis Code(s):        --- Professional ---                           K64.8, Other hemorrhoids                           K63.89, Other specified  diseases of intestine                           K52.9, Noninfective gastroenteritis and colitis,                            unspecified  K62.89, Other specified diseases of anus and rectum CPT copyright 2022 American Medical Association. All rights reserved. The codes documented in this report are preliminary and upon coder review may  be revised to meet current compliance requirements. Sanjuan Dame, MD Sanjuan Dame, MD 01/27/2023 9:54:13 AM This report has been signed electronically. Number of Addenda: 0

## 2023-01-27 NOTE — Anesthesia Preprocedure Evaluation (Signed)
Anesthesia Evaluation  Patient identified by MRN, date of birth, ID band Patient awake    Reviewed: Allergy & Precautions, H&P , NPO status , Patient's Chart, lab work & pertinent test results, reviewed documented beta blocker date and time   Airway Mallampati: II  TM Distance: >3 FB Neck ROM: full    Dental no notable dental hx.    Pulmonary neg pulmonary ROS, pneumonia   Pulmonary exam normal breath sounds clear to auscultation       Cardiovascular Exercise Tolerance: Good hypertension, + CAD, + Past MI, + Cardiac Stents and + Peripheral Vascular Disease   Rhythm:regular Rate:Normal     Neuro/Psych Seizures -,  PSYCHIATRIC DISORDERS Anxiety Depression    CVA negative neurological ROS  negative psych ROS   GI/Hepatic negative GI ROS, Neg liver ROS,GERD  ,,  Endo/Other  negative endocrine ROSdiabetes    Renal/GU Renal diseasenegative Renal ROS  negative genitourinary   Musculoskeletal   Abdominal   Peds  Hematology negative hematology ROS (+) Blood dyscrasia, anemia   Anesthesia Other Findings   Reproductive/Obstetrics negative OB ROS                             Anesthesia Physical Anesthesia Plan  ASA: 3  Anesthesia Plan: General   Post-op Pain Management:    Induction:   PONV Risk Score and Plan: Propofol infusion  Airway Management Planned:   Additional Equipment:   Intra-op Plan:   Post-operative Plan:   Informed Consent: I have reviewed the patients History and Physical, chart, labs and discussed the procedure including the risks, benefits and alternatives for the proposed anesthesia with the patient or authorized representative who has indicated his/her understanding and acceptance.     Dental Advisory Given  Plan Discussed with: CRNA  Anesthesia Plan Comments:        Anesthesia Quick Evaluation

## 2023-01-27 NOTE — Discharge Instructions (Signed)
  Discharge instructions Please read the instructions outlined below and refer to this sheet in the next few weeks. These discharge instructions provide you with general information on caring for yourself after you leave the hospital. Your doctor may also give you specific instructions. While your treatment has been planned according to the most current medical practices available, unavoidable complications occasionally occur. If you have any problems or questions after discharge, please call your doctor. ACTIVITY You may resume your regular activity but move at a slower pace for the next 24 hours.  Take frequent rest periods for the next 24 hours.  Walking will help expel (get rid of) the air and reduce the bloated feeling in your abdomen.  No driving for 24 hours (because of the anesthesia (medicine) used during the test).  You may shower.  Do not sign any important legal documents or operate any machinery for 24 hours (because of the anesthesia used during the test).  NUTRITION Drink plenty of fluids.  You may resume your normal diet.  Begin with a light meal and progress to your normal diet.  Avoid alcoholic beverages for 24 hours or as instructed by your caregiver.  MEDICATIONS You may resume your normal medications unless your caregiver tells you otherwise.  WHAT YOU CAN EXPECT TODAY You may experience abdominal discomfort such as a feeling of fullness or "gas" pains.  FOLLOW-UP Your doctor will discuss the results of your test with you.  SEEK IMMEDIATE MEDICAL ATTENTION IF ANY OF THE FOLLOWING OCCUR: Excessive nausea (feeling sick to your stomach) and/or vomiting.  Severe abdominal pain and distention (swelling).  Trouble swallowing.  Temperature over 101 F (37.8 C).  Rectal bleeding or vomiting of blood.      I hope you have a great rest of your week!   Vista Lawman , M.D.. Gastroenterology and Hepatology [facility]

## 2023-01-27 NOTE — Interval H&P Note (Signed)
History and Physical Interval Note:  01/27/2023 8:48 AM  [name].  has presented today for surgery, with the diagnosis of rectal pain, diarrhea, weight loss.  The various methods of treatment have been discussed with the patient and family. After consideration of risks, benefits and other options for treatment, the patient has consented to  Procedure(s) with comments: COLONOSCOPY WITH PROPOFOL (N/A) - 12:30pm, asa 3 as a surgical intervention.  The patient's history has been reviewed, patient examined, no change in status, stable for surgery.  I have reviewed the patient's chart and labs.  Questions were answered to the patient's satisfaction.     [name]

## 2023-01-28 LAB — SURGICAL PATHOLOGY

## 2023-01-28 NOTE — Anesthesia Postprocedure Evaluation (Signed)
Anesthesia Post Note  Patient: [name].  Procedure(s) Performed: COLONOSCOPY WITH PROPOFOL  Patient location during evaluation: Phase II Anesthesia Type: General Level of consciousness: awake Pain management: pain level controlled Vital Signs Assessment: post-procedure vital signs reviewed and stable Respiratory status: spontaneous breathing and respiratory function stable Cardiovascular status: blood pressure returned to baseline and stable Postop Assessment: no headache and no apparent nausea or vomiting Anesthetic complications: no Comments: Late entry   No notable events documented.   Last Vitals:  Vitals:   01/27/23 1003 01/27/23 1005  BP: (!) 98/51 (!) 101/51  Pulse: (!) 48 (!) 48  Resp: 14 18  Temp:    SpO2: 99% 99%    Last Pain:  Vitals:   01/28/23 1553  TempSrc:   PainSc: 0-No pain                 Windell Norfolk

## 2023-01-31 NOTE — Progress Notes (Signed)
[name] : patient had a poor bowel prep colonoscopy . Please schedule repeat in 1 month  Room 3 .  Extended bowel prep (MiraLAX twice daily plus regular bowel prep)  diagnosis: chronic diarrhea   I reviewed the pathology results. [name], can you send her a letter with the findings as described below please?  Thanks,  Vista Lawman, MD Gastroenterology and Hepatology [facility]  ---------------------------------------------------------------------------------------------  [facility] [address], [address], [place], [place] [address] Phone:  [phone]   01/31/23 [name], [place]   Dear [name].,  I am writing to inform you that the biopsies taken during your recent endoscopic examination showed:  Your colonoscopy was inadequate as there was a lot of stool hence I was not able to examine your colon completely.  Although a biopsy taking demonstrate hyperplastic polyp .  I recommend repeat colonoscopy in 1 month which can be scheduled directly.  Also I value your feedback , so if you get a survey , please take the time to fill it out and thank you for choosing [facility]  Please call us at [phone] if you have persistent problems or have questions about your condition that have not been fully answered at this time.  Sincerely,  Vista Lawman, MD Gastroenterology and Hepatology

## 2023-02-01 ENCOUNTER — Other Ambulatory Visit (INDEPENDENT_AMBULATORY_CARE_PROVIDER_SITE_OTHER): Payer: Self-pay | Admitting: Gastroenterology

## 2023-02-01 MED ORDER — PEG 3350-KCL-NA BICARB-NACL 420 G PO SOLR: 4000.0000 mL | Freq: Once | ORAL | 0 refills | Status: AC

## 2023-02-03 ENCOUNTER — Ambulatory Visit (INDEPENDENT_AMBULATORY_CARE_PROVIDER_SITE_OTHER): Payer: Medicare HMO

## 2023-02-03 ENCOUNTER — Encounter: Payer: Self-pay | Admitting: Podiatry

## 2023-02-03 ENCOUNTER — Other Ambulatory Visit: Payer: Self-pay

## 2023-02-03 ENCOUNTER — Inpatient Hospital Stay (HOSPITAL_COMMUNITY)
Admission: EM | Admit: 2023-02-03 | Discharge: 2023-02-23 | DRG: 239 | Disposition: A | Discharge status: Skilled Nursing Facility | Payer: Medicare HMO | Source: Ambulatory Visit | Attending: Family Medicine | Admitting: Family Medicine

## 2023-02-03 ENCOUNTER — Emergency Department (HOSPITAL_COMMUNITY): Payer: Medicare HMO

## 2023-02-03 ENCOUNTER — Encounter (INDEPENDENT_AMBULATORY_CARE_PROVIDER_SITE_OTHER): Payer: Self-pay | Admitting: *Deleted

## 2023-02-03 ENCOUNTER — Inpatient Hospital Stay (HOSPITAL_COMMUNITY): Payer: Medicare HMO

## 2023-02-03 ENCOUNTER — Encounter (HOSPITAL_COMMUNITY): Payer: Self-pay

## 2023-02-03 ENCOUNTER — Ambulatory Visit (INDEPENDENT_AMBULATORY_CARE_PROVIDER_SITE_OTHER): Payer: Medicare HMO | Admitting: Podiatry

## 2023-02-03 DIAGNOSIS — G40909 Epilepsy, unspecified, not intractable, without status epilepticus: Secondary | ICD-10-CM | POA: Diagnosis present

## 2023-02-03 DIAGNOSIS — Z87898 Personal history of other specified conditions: Secondary | ICD-10-CM

## 2023-02-03 DIAGNOSIS — E1169 Type 2 diabetes mellitus with other specified complication: Secondary | ICD-10-CM | POA: Diagnosis present

## 2023-02-03 DIAGNOSIS — E8809 Other disorders of plasma-protein metabolism, not elsewhere classified: Secondary | ICD-10-CM | POA: Diagnosis present

## 2023-02-03 DIAGNOSIS — I2510 Atherosclerotic heart disease of native coronary artery without angina pectoris: Secondary | ICD-10-CM | POA: Diagnosis present

## 2023-02-03 DIAGNOSIS — Z823 Family history of stroke: Secondary | ICD-10-CM

## 2023-02-03 DIAGNOSIS — E1165 Type 2 diabetes mellitus with hyperglycemia: Secondary | ICD-10-CM | POA: Diagnosis present

## 2023-02-03 DIAGNOSIS — L03115 Cellulitis of right lower limb: Secondary | ICD-10-CM | POA: Diagnosis present

## 2023-02-03 DIAGNOSIS — Z89511 Acquired absence of right leg below knee: Secondary | ICD-10-CM | POA: Diagnosis not present

## 2023-02-03 DIAGNOSIS — L97429 Non-pressure chronic ulcer of left heel and midfoot with unspecified severity: Secondary | ICD-10-CM | POA: Diagnosis present

## 2023-02-03 DIAGNOSIS — Z7901 Long term (current) use of anticoagulants: Secondary | ICD-10-CM

## 2023-02-03 DIAGNOSIS — L89891 Pressure ulcer of other site, stage 1: Secondary | ICD-10-CM | POA: Diagnosis not present

## 2023-02-03 DIAGNOSIS — I5032 Chronic diastolic (congestive) heart failure: Secondary | ICD-10-CM | POA: Diagnosis present

## 2023-02-03 DIAGNOSIS — K529 Noninfective gastroenteritis and colitis, unspecified: Secondary | ICD-10-CM | POA: Diagnosis present

## 2023-02-03 DIAGNOSIS — L02611 Cutaneous abscess of right foot: Secondary | ICD-10-CM | POA: Diagnosis present

## 2023-02-03 DIAGNOSIS — N2581 Secondary hyperparathyroidism of renal origin: Secondary | ICD-10-CM | POA: Diagnosis present

## 2023-02-03 DIAGNOSIS — Z794 Long term (current) use of insulin: Secondary | ICD-10-CM | POA: Diagnosis not present

## 2023-02-03 DIAGNOSIS — H5789 Other specified disorders of eye and adnexa: Secondary | ICD-10-CM | POA: Diagnosis not present

## 2023-02-03 DIAGNOSIS — E785 Hyperlipidemia, unspecified: Secondary | ICD-10-CM

## 2023-02-03 DIAGNOSIS — I480 Paroxysmal atrial fibrillation: Secondary | ICD-10-CM | POA: Diagnosis present

## 2023-02-03 DIAGNOSIS — L97413 Non-pressure chronic ulcer of right heel and midfoot with necrosis of muscle: Secondary | ICD-10-CM

## 2023-02-03 DIAGNOSIS — Z833 Family history of diabetes mellitus: Secondary | ICD-10-CM

## 2023-02-03 DIAGNOSIS — Z992 Dependence on renal dialysis: Secondary | ICD-10-CM

## 2023-02-03 DIAGNOSIS — Z955 Presence of coronary angioplasty implant and graft: Secondary | ICD-10-CM

## 2023-02-03 DIAGNOSIS — Z79890 Hormone replacement therapy (postmenopausal): Secondary | ICD-10-CM

## 2023-02-03 DIAGNOSIS — F32A Depression, unspecified: Secondary | ICD-10-CM | POA: Diagnosis present

## 2023-02-03 DIAGNOSIS — N186 End stage renal disease: Secondary | ICD-10-CM | POA: Diagnosis present

## 2023-02-03 DIAGNOSIS — I252 Old myocardial infarction: Secondary | ICD-10-CM

## 2023-02-03 DIAGNOSIS — E1142 Type 2 diabetes mellitus with diabetic polyneuropathy: Secondary | ICD-10-CM | POA: Diagnosis present

## 2023-02-03 DIAGNOSIS — E11649 Type 2 diabetes mellitus with hypoglycemia without coma: Secondary | ICD-10-CM | POA: Diagnosis not present

## 2023-02-03 DIAGNOSIS — I132 Hypertensive heart and chronic kidney disease with heart failure and with stage 5 chronic kidney disease, or end stage renal disease: Secondary | ICD-10-CM | POA: Diagnosis present

## 2023-02-03 DIAGNOSIS — R296 Repeated falls: Secondary | ICD-10-CM | POA: Diagnosis present

## 2023-02-03 DIAGNOSIS — I69354 Hemiplegia and hemiparesis following cerebral infarction affecting left non-dominant side: Secondary | ICD-10-CM | POA: Diagnosis not present

## 2023-02-03 DIAGNOSIS — Z89412 Acquired absence of left great toe: Secondary | ICD-10-CM

## 2023-02-03 DIAGNOSIS — I739 Peripheral vascular disease, unspecified: Secondary | ICD-10-CM | POA: Diagnosis present

## 2023-02-03 DIAGNOSIS — E872 Acidosis, unspecified: Secondary | ICD-10-CM | POA: Diagnosis present

## 2023-02-03 DIAGNOSIS — E11628 Type 2 diabetes mellitus with other skin complications: Secondary | ICD-10-CM | POA: Diagnosis not present

## 2023-02-03 DIAGNOSIS — Z89421 Acquired absence of other right toe(s): Secondary | ICD-10-CM

## 2023-02-03 DIAGNOSIS — L089 Local infection of the skin and subcutaneous tissue, unspecified: Secondary | ICD-10-CM | POA: Diagnosis not present

## 2023-02-03 DIAGNOSIS — I10 Essential (primary) hypertension: Secondary | ICD-10-CM | POA: Diagnosis present

## 2023-02-03 DIAGNOSIS — E876 Hypokalemia: Secondary | ICD-10-CM | POA: Diagnosis present

## 2023-02-03 DIAGNOSIS — Z91158 Patient's noncompliance with renal dialysis for other reason: Secondary | ICD-10-CM

## 2023-02-03 DIAGNOSIS — I70261 Atherosclerosis of native arteries of extremities with gangrene, right leg: Secondary | ICD-10-CM | POA: Diagnosis present

## 2023-02-03 DIAGNOSIS — E11621 Type 2 diabetes mellitus with foot ulcer: Secondary | ICD-10-CM | POA: Diagnosis not present

## 2023-02-03 DIAGNOSIS — Z8249 Family history of ischemic heart disease and other diseases of the circulatory system: Secondary | ICD-10-CM

## 2023-02-03 DIAGNOSIS — K219 Gastro-esophageal reflux disease without esophagitis: Secondary | ICD-10-CM | POA: Diagnosis present

## 2023-02-03 DIAGNOSIS — D631 Anemia in chronic kidney disease: Secondary | ICD-10-CM | POA: Diagnosis present

## 2023-02-03 DIAGNOSIS — L89321 Pressure ulcer of left buttock, stage 1: Secondary | ICD-10-CM | POA: Diagnosis present

## 2023-02-03 DIAGNOSIS — R54 Age-related physical debility: Secondary | ICD-10-CM | POA: Diagnosis present

## 2023-02-03 DIAGNOSIS — E871 Hypo-osmolality and hyponatremia: Secondary | ICD-10-CM | POA: Diagnosis present

## 2023-02-03 DIAGNOSIS — M778 Other enthesopathies, not elsewhere classified: Secondary | ICD-10-CM

## 2023-02-03 DIAGNOSIS — M869 Osteomyelitis, unspecified: Secondary | ICD-10-CM

## 2023-02-03 DIAGNOSIS — Z7409 Other reduced mobility: Secondary | ICD-10-CM | POA: Diagnosis present

## 2023-02-03 DIAGNOSIS — Z888 Allergy status to other drugs, medicaments and biological substances status: Secondary | ICD-10-CM

## 2023-02-03 DIAGNOSIS — Z751 Person awaiting admission to adequate facility elsewhere: Secondary | ICD-10-CM

## 2023-02-03 DIAGNOSIS — E1122 Type 2 diabetes mellitus with diabetic chronic kidney disease: Secondary | ICD-10-CM | POA: Diagnosis present

## 2023-02-03 DIAGNOSIS — Z8673 Personal history of transient ischemic attack (TIA), and cerebral infarction without residual deficits: Secondary | ICD-10-CM

## 2023-02-03 DIAGNOSIS — Z5986 Financial insecurity: Secondary | ICD-10-CM

## 2023-02-03 DIAGNOSIS — I96 Gangrene, not elsewhere classified: Secondary | ICD-10-CM

## 2023-02-03 DIAGNOSIS — Z79899 Other long term (current) drug therapy: Secondary | ICD-10-CM

## 2023-02-03 DIAGNOSIS — Z841 Family history of disorders of kidney and ureter: Secondary | ICD-10-CM

## 2023-02-03 DIAGNOSIS — M86171 Other acute osteomyelitis, right ankle and foot: Secondary | ICD-10-CM | POA: Diagnosis present

## 2023-02-03 DIAGNOSIS — Z818 Family history of other mental and behavioral disorders: Secondary | ICD-10-CM

## 2023-02-03 DIAGNOSIS — Z8679 Personal history of other diseases of the circulatory system: Secondary | ICD-10-CM

## 2023-02-03 DIAGNOSIS — Z8674 Personal history of sudden cardiac arrest: Secondary | ICD-10-CM

## 2023-02-03 DIAGNOSIS — G4700 Insomnia, unspecified: Secondary | ICD-10-CM | POA: Diagnosis present

## 2023-02-03 DIAGNOSIS — E119 Type 2 diabetes mellitus without complications: Secondary | ICD-10-CM

## 2023-02-03 DIAGNOSIS — Z89422 Acquired absence of other left toe(s): Secondary | ICD-10-CM

## 2023-02-03 DIAGNOSIS — E1152 Type 2 diabetes mellitus with diabetic peripheral angiopathy with gangrene: Principal | ICD-10-CM | POA: Diagnosis present

## 2023-02-03 DIAGNOSIS — Z87442 Personal history of urinary calculi: Secondary | ICD-10-CM

## 2023-02-03 DIAGNOSIS — Z7982 Long term (current) use of aspirin: Secondary | ICD-10-CM

## 2023-02-03 DIAGNOSIS — F419 Anxiety disorder, unspecified: Secondary | ICD-10-CM | POA: Diagnosis present

## 2023-02-03 DIAGNOSIS — N400 Enlarged prostate without lower urinary tract symptoms: Secondary | ICD-10-CM | POA: Diagnosis present

## 2023-02-03 DIAGNOSIS — R001 Bradycardia, unspecified: Secondary | ICD-10-CM | POA: Diagnosis present

## 2023-02-03 LAB — CBC WITH DIFFERENTIAL/PLATELET
Abs Immature Granulocytes: 0.37 10*3/uL — ABNORMAL HIGH (ref 0.00–0.07)
Basophils Absolute: 0 10*3/uL (ref 0.0–0.1)
Basophils Relative: 0 %
Eosinophils Absolute: 0 10*3/uL (ref 0.0–0.5)
Eosinophils Relative: 0 %
HCT: 28.7 % — ABNORMAL LOW (ref 39.0–52.0)
Hemoglobin: 8.9 g/dL — ABNORMAL LOW (ref 13.0–17.0)
Immature Granulocytes: 2 %
Lymphocytes Relative: 3 %
Lymphs Abs: 0.7 10*3/uL (ref 0.7–4.0)
MCH: 22.8 pg — ABNORMAL LOW (ref 26.0–34.0)
MCHC: 31 g/dL (ref 30.0–36.0)
MCV: 73.4 fL — ABNORMAL LOW (ref 80.0–100.0)
Monocytes Absolute: 1.7 10*3/uL — ABNORMAL HIGH (ref 0.1–1.0)
Monocytes Relative: 8 %
Neutro Abs: 19.5 10*3/uL — ABNORMAL HIGH (ref 1.7–7.7)
Neutrophils Relative %: 87 %
Platelets: 203 10*3/uL (ref 150–400)
RBC: 3.91 MIL/uL — ABNORMAL LOW (ref 4.22–5.81)
RDW: 20.5 % — ABNORMAL HIGH (ref 11.5–15.5)
WBC: 22.3 10*3/uL — ABNORMAL HIGH (ref 4.0–10.5)
nRBC: 0 % (ref 0.0–0.2)

## 2023-02-03 LAB — COMPREHENSIVE METABOLIC PANEL WITH GFR
ALT: 10 U/L (ref 0–44)
AST: 12 U/L — ABNORMAL LOW (ref 15–41)
Albumin: 2.7 g/dL — ABNORMAL LOW (ref 3.5–5.0)
Alkaline Phosphatase: 65 U/L (ref 38–126)
Anion gap: 24 — ABNORMAL HIGH (ref 5–15)
BUN: 122 mg/dL — ABNORMAL HIGH (ref 6–20)
CO2: 13 mmol/L — ABNORMAL LOW (ref 22–32)
Calcium: 9.6 mg/dL (ref 8.9–10.3)
Chloride: 104 mmol/L (ref 98–111)
Creatinine, Ser: 18.57 mg/dL — ABNORMAL HIGH (ref 0.61–1.24)
GFR, Estimated: 3 mL/min — ABNORMAL LOW
Glucose, Bld: 82 mg/dL (ref 70–99)
Potassium: 4.4 mmol/L (ref 3.5–5.1)
Sodium: 141 mmol/L (ref 135–145)
Total Bilirubin: 0.9 mg/dL
Total Protein: 6.9 g/dL (ref 6.5–8.1)

## 2023-02-03 LAB — CBG MONITORING, ED: Glucose-Capillary: 84 mg/dL (ref 70–99)

## 2023-02-03 LAB — MAGNESIUM: Magnesium: 2.4 mg/dL (ref 1.7–2.4)

## 2023-02-03 LAB — I-STAT CG4 LACTIC ACID, ED: Lactic Acid, Venous: 1.1 mmol/L (ref 0.5–1.9)

## 2023-02-03 MED ORDER — METRONIDAZOLE 500 MG/100ML IV SOLN
500.0000 mg | Freq: Once | INTRAVENOUS | Status: AC
  Administered 2023-02-03: 500 mg via INTRAVENOUS
  Filled 2023-02-03: qty 100

## 2023-02-03 MED ORDER — VANCOMYCIN HCL IN DEXTROSE 1-5 GM/200ML-% IV SOLN
1000.0000 mg | Freq: Once | INTRAVENOUS | Status: AC
  Administered 2023-02-03: 1000 mg via INTRAVENOUS
  Filled 2023-02-03: qty 200

## 2023-02-03 MED ORDER — AMLODIPINE BESYLATE 5 MG PO TABS
5.0000 mg | ORAL_TABLET | Freq: Every day | ORAL | Status: DC
  Administered 2023-02-04 – 2023-02-05 (×2): 5 mg via ORAL
  Filled 2023-02-03 (×2): qty 1

## 2023-02-03 MED ORDER — SODIUM CHLORIDE 0.9 % IV BOLUS
500.0000 mL | Freq: Once | INTRAVENOUS | Status: AC
Start: 2023-02-03 — End: 2023-02-03
  Administered 2023-02-03: 500 mL via INTRAVENOUS

## 2023-02-03 MED ORDER — CARVEDILOL 25 MG PO TABS
25.0000 mg | ORAL_TABLET | Freq: Two times a day (BID) | ORAL | Status: DC
  Administered 2023-02-03: 25 mg via ORAL
  Filled 2023-02-03: qty 1

## 2023-02-03 MED ORDER — ACETAMINOPHEN 325 MG PO TABS
650.0000 mg | ORAL_TABLET | Freq: Four times a day (QID) | ORAL | Status: DC | PRN
  Administered 2023-02-06: 650 mg via ORAL
  Filled 2023-02-03 (×3): qty 2

## 2023-02-03 MED ORDER — SODIUM CHLORIDE 0.9 % IV SOLN
2.0000 g | Freq: Once | INTRAVENOUS | Status: AC
  Administered 2023-02-03: 2 g via INTRAVENOUS
  Filled 2023-02-03: qty 12.5

## 2023-02-03 MED ORDER — AMIODARONE HCL 200 MG PO TABS
200.0000 mg | ORAL_TABLET | Freq: Every day | ORAL | Status: DC
  Administered 2023-02-04 – 2023-02-05 (×2): 200 mg via ORAL
  Filled 2023-02-03 (×2): qty 1

## 2023-02-03 MED ORDER — ATORVASTATIN CALCIUM 80 MG PO TABS
80.0000 mg | ORAL_TABLET | Freq: Every day | ORAL | Status: DC
  Administered 2023-02-04 – 2023-02-22 (×15): 80 mg via ORAL
  Filled 2023-02-03 (×18): qty 1

## 2023-02-03 MED ORDER — LEVETIRACETAM 500 MG PO TABS
500.0000 mg | ORAL_TABLET | Freq: Every day | ORAL | Status: DC
  Administered 2023-02-04 – 2023-02-06 (×3): 500 mg via ORAL
  Filled 2023-02-03 (×3): qty 1

## 2023-02-03 MED ORDER — CALCIUM ACETATE (PHOS BINDER) 667 MG PO CAPS
1334.0000 mg | ORAL_CAPSULE | Freq: Three times a day (TID) | ORAL | Status: DC
  Administered 2023-02-04 – 2023-02-23 (×37): 1334 mg via ORAL
  Filled 2023-02-03 (×38): qty 2

## 2023-02-03 MED ORDER — DORZOLAMIDE HCL-TIMOLOL MAL 2-0.5 % OP SOLN
1.0000 [drp] | Freq: Two times a day (BID) | OPHTHALMIC | Status: DC
  Administered 2023-02-03 – 2023-02-18 (×28): 1 [drp] via OPHTHALMIC
  Filled 2023-02-03: qty 10

## 2023-02-03 MED ORDER — SODIUM CHLORIDE 0.9% FLUSH
3.0000 mL | Freq: Two times a day (BID) | INTRAVENOUS | Status: DC
  Administered 2023-02-04 – 2023-02-09 (×10): 3 mL via INTRAVENOUS

## 2023-02-03 MED ORDER — SODIUM CHLORIDE 0.9 % IV BOLUS: 1000.0000 mL | Freq: Once | INTRAVENOUS | Status: DC

## 2023-02-03 MED ORDER — ACETAMINOPHEN 325 MG PO TABS: 325.0000 mg | ORAL_TABLET | ORAL | Status: DC | PRN

## 2023-02-03 MED ORDER — ONDANSETRON HCL 4 MG/2ML IJ SOLN
4.0000 mg | Freq: Once | INTRAMUSCULAR | Status: AC
  Administered 2023-02-03: 4 mg via INTRAVENOUS
  Filled 2023-02-03: qty 2

## 2023-02-03 MED ORDER — LEVETIRACETAM 250 MG PO TABS
250.0000 mg | ORAL_TABLET | ORAL | Status: DC
  Administered 2023-02-05: 250 mg via ORAL
  Filled 2023-02-03: qty 1

## 2023-02-03 MED ORDER — ACETAMINOPHEN 650 MG RE SUPP
650.0000 mg | Freq: Four times a day (QID) | RECTAL | Status: DC | PRN
  Administered 2023-02-08: 650 mg via RECTAL
  Filled 2023-02-03: qty 1

## 2023-02-03 MED ORDER — AMPHETAMINE-DEXTROAMPHET ER 5 MG PO CP24
5.0000 mg | ORAL_CAPSULE | Freq: Every day | ORAL | Status: DC
Start: 2023-02-04 — End: 2023-02-19
  Administered 2023-02-05 – 2023-02-18 (×12): 5 mg via ORAL
  Filled 2023-02-03 (×12): qty 1

## 2023-02-03 MED ORDER — HYDROMORPHONE HCL 1 MG/ML IJ SOLN
0.5000 mg | Freq: Once | INTRAMUSCULAR | Status: AC
  Administered 2023-02-03: 0.5 mg via INTRAVENOUS
  Filled 2023-02-03: qty 1

## 2023-02-03 MED ORDER — SODIUM CHLORIDE 0.9 % IV SOLN
1.0000 g | INTRAVENOUS | Status: AC
  Administered 2023-02-05 – 2023-02-09 (×6): 1 g via INTRAVENOUS
  Filled 2023-02-03: qty 10
  Filled 2023-02-03: qty 1
  Filled 2023-02-03 (×4): qty 10
  Filled 2023-02-03: qty 1

## 2023-02-03 NOTE — ED Notes (Signed)
This RN received transfer of care report from previous RN, Alana.

## 2023-02-03 NOTE — H&P (Signed)
History and Physical    [name]. NWG:[id number] DOB: Oct 12, 1970 DOA: 02/03/2023  PCP: [name], FNP  Patient coming from: Podiatry office  I have personally briefly reviewed patient's old medical records in [facility]  Chief Complaint: Right foot infected wound  HPI: [name]. is a 52 y.o. male with medical history significant of type 2 diabetes mellitus, acute osteomyelitis of toe of right foot with gangrene status post amputation of the great toe on the right foot, paroxysmal atrial fibrillation not on anticoagulation, hypertension, ESRD on HD, seizure disorder, BPH, diabetic polyneuropathy, GERD, gastroparesis, hypertension was sent to ED from podiatry office.  Per patient and his wife who is at the bedside, patient's wife noted a wound on his right foot on Sunday.  He called his podiatrist/Dr. Ardelle Anton and was given an appointment to see today.  Being seen in the podiatry office today, he was noted to have significant pus and infected wound, he was directed to the emergency department for further workup and admission.  Patient denies any complaint, no pain.  Per wife, patient typically does not notice things himself until she does.  No reports of fever, chills, sweating, nausea, vomiting any other complaint or any foot trauma, any recent travel or sick contact.  ED Course: Hemodynamically stable upon arrival.  X-ray right foot unremarkable.  MRI ordered and pending.  Patient received cefepime, Flagyl and vancomycin in the ED and nephrology was consulted for dialysis and hospitalist were consulted for admission.  Review of Systems: As per HPI otherwise negative.    Past Medical History:  Diagnosis Date   Acute bilateral cerebral infarction in a watershed distribution [facility]) 04/21/2021   a.) MRI brain 04/21/2021 --> patchy ACUTE cortically-based infarcts within the bilateral high frontal lobes and right parietal lobe   Acute cerebral infarction (HCC)  02/13/2021   a.) MRI brain 02/13/2021: acute LEFT hippocampal infarct   Acute cerebral infarction (HCC) 04/21/2021   a.) MRI brain 04/21/2021: subcentimeter ACUTE infarct within the callosal splenium on the LEFT   Anemia of chronic renal failure    Anxiety    Aortic dilatation (HCC) 11/25/2020   a.) TTE 11/25/2020: Ao root measured 38 mm. b.) TTE 04/22/2021: Ao root measured 44 mm; c. 11/2021 Echo: Ao root 40mm.   Atrial fibrillation (HCC)    a.) CHA2DS2-VASc = 6 (CHF, HTN, CVA x2, prior MI, T2DM). b.) rate/rhythm maintained on oral amiodarone + carvedilol; chronic OAC/AP therapy discontinued following ICH   BPH (benign prostatic hyperplasia)    Cardiac arrest (HCC) 07/26/2021   a.) in setting of hyperkalemia, NSTEMI, and seizure following missed HD; pulseless and apneic --> required 1 round of CPR prior to ROSC   Cellulitis    Cerebral hemorrhage (HCC) 11/21/2021   a.) CT head 11/21/2021 --> 1.4 x 1.5 x 3.1 cm ACUTE hemorrhage within the inferior aspect of the fourth ventricle, and extending inferiorly through the foramen of foramen of [place] --> occurred in setting of HTN emergency and DOAC/APT-->eliquis/plavix d/c'd.   Cerebral microvascular disease    Chronic diarrhea    Chronic heart failure with preserved ejection fraction (HFpEF) (HCC) 07/19/2017   a.) 06/2017 Echo: EF 75%; b.) 11/2020 Echo: EF 55-60%; c.) 04/2021 Echo: EF 55-60%; d.) 11/2021 Echo: EF 65-70%, GrII DD, nl RV fxn, sev dil LA, large circumferential pericardial eff w/o tamponade, triv MR, AoV sclerosis. Ao root 40mm; e.) TTE 02/28/2025: EF 60-65%, sev LVH, mod posterior LV effusion, AoV sclerosis   Chronic pain of  both ankles    Coronary artery disease 02/26/2020   a. 02/2020 Ant STEMI/PCI: LM nl, LAD 1m (3.5x30 Resolute Onyx), RI nl, LCX min irregs, RCA min irregs. EF 65%.   DDD (degenerative disc disease), cervical    Diabetes mellitus, type II (HCC)    Diabetic neuropathy (HCC)    Esophagitis    ESRD (end stage renal  disease) on dialysis (HCC)    a.) [facility]; T-Th-Sat   Frequent falls    Gait instability    Gastritis    Gastroparesis    GERD (gastroesophageal reflux disease)    H/O enucleation of left eyeball    Heart palpitations    History of kidney stones    HLD (hyperlipidemia)    Hypertension    Insomnia    a.) uses melatonin PRN   Long term current use of amiodarone    Nausea and vomiting in adult    recurrent   NSTEMI (non-ST elevated myocardial infarction) (HCC) 07/26/2021   a.) hyperkalemic from missed HD; seizures; decompensated to cardiac arrest and required CPR x 1 round prior to ROSC.   Osteomyelitis (HCC)    Pericardial effusion    a. 11/2021 Echo: EF 65-70%, GrII DD, nl RV fxn, sev dil LA, large circumferential pericardial eff w/o tamponade, triv MR, AoV sclerosis. Ao root 40mm.   Seizure (HCC)    a.) last 07/26/2021 in setting of missed HD --> hyperkalemic at 6.5 --> pulseless/apneic and required CPR; discharged home on levetiracetam.   ST elevation myocardial infarction (STEMI) of anterior wall (HCC) 02/26/2020   a.) LHC/PCI 02/26/2020 --> EF 65%; LVEDP 11 mmHg; 90% mLAD (3.5 x 20 mm Resolute Onyx DES x 1)    Past Surgical History:  Procedure Laterality Date   A/V FISTULAGRAM Left 08/31/2021   Procedure: A/V Fistulagram;  Surgeon: Annice Needy, MD;  Location: [name] INVASIVE CV LAB;  Service: Cardiovascular;  Laterality: Left;   A/V FISTULAGRAM Left 01/25/2022   Procedure: A/V Fistulagram;  Surgeon: Annice Needy, MD;  Location: [name] INVASIVE CV LAB;  Service: Cardiovascular;  Laterality: Left;   A/V FISTULAGRAM Right 10/18/2022   Procedure: A/V Fistulagram;  Surgeon: Annice Needy, MD;  Location: [name] INVASIVE CV LAB;  Service: Cardiovascular;  Laterality: Right;   AMPUTATION Left 03/16/2016   Procedure: AMPUTATION DIGIT LEFT HALLUX;  Surgeon: Vivi Barrack, DPM;  Location: [facility] OR;  Service: Podiatry;  Laterality: Left;  can start around 5    AMPUTATION Right 02/09/2021    Procedure: AMPUTATION DIGIT;  Surgeon: Marlyne Beards, MD;  Location: [facility] OR;  Service: Orthopedics;  Laterality: Right;   AMPUTATION TOE Right 02/28/2022   Procedure: AMPUTATION TOE;  Surgeon: Louann Sjogren, DPM;  Location: [name] ORS;  Service: Podiatry;  Laterality: Right;  right fifth toe   ARTHROSCOPIC REPAIR ACL Left    AV FISTULA PLACEMENT Right 05/31/2019   Procedure: Brachiocephalic AV fistula creation;  Surgeon: Annice Needy, MD;  Location: [name] ORS;  Service: Vascular;  Laterality: Right;   AV FISTULA PLACEMENT Left 08/13/2021   Procedure: ARTERIOVENOUS (AV) FISTULA CREATION (BRACHIALCEPHALIC);  Surgeon: Annice Needy, MD;  Location: [name] ORS;  Service: Vascular;  Laterality: Left;   COLONOSCOPY WITH PROPOFOL N/A 10/28/2015   Procedure: COLONOSCOPY WITH PROPOFOL;  Surgeon: Christena Deem, MD;  Location: [facility] ENDOSCOPY;  Service: Endoscopy;  Laterality: N/A;   COLONOSCOPY WITH PROPOFOL N/A 10/29/2015   Procedure: COLONOSCOPY WITH PROPOFOL;  Surgeon: Christena Deem, MD;  Location: [facility] ENDOSCOPY;  Service: Endoscopy;  Laterality: N/A;   CORONARY ULTRASOUND/IVUS N/A 02/26/2020   Procedure: Intravascular Ultrasound/IVUS;  Surgeon: Corky Crafts, MD;  Location: [facility] INVASIVE CV LAB;  Service: Cardiovascular;  Laterality: N/A;   CORONARY/GRAFT ACUTE MI REVASCULARIZATION N/A 02/26/2020   Procedure: Coronary/Graft Acute MI Revascularization;  Surgeon: Corky Crafts, MD;  Location: [facility] INVASIVE CV LAB;  Service: Cardiovascular;  Laterality: N/A;   DIALYSIS/PERMA CATHETER INSERTION Right 04/26/2019   Perm Cath    DIALYSIS/PERMA CATHETER INSERTION N/A 04/26/2019   Procedure: DIALYSIS/PERMA CATHETER INSERTION;  Surgeon: Annice Needy, MD;  Location: [name] INVASIVE CV LAB;  Service: Cardiovascular;  Laterality: N/A;   DIALYSIS/PERMA CATHETER INSERTION N/A 05/25/2021   Procedure: DIALYSIS/PERMA CATHETER INSERTION;  Surgeon: Annice Needy, MD;  Location: [name] INVASIVE CV LAB;  Service:  Cardiovascular;  Laterality: N/A;   DIALYSIS/PERMA CATHETER INSERTION N/A 08/31/2021   Procedure: DIALYSIS/PERMA CATHETER INSERTION;  Surgeon: Annice Needy, MD;  Location: [name] INVASIVE CV LAB;  Service: Cardiovascular;  Laterality: N/A;   DIALYSIS/PERMA CATHETER REMOVAL N/A 08/15/2019   Procedure: DIALYSIS/PERMA CATHETER REMOVAL;  Surgeon: Renford Dills, MD;  Location: [name] INVASIVE CV LAB;  Service: Cardiovascular;  Laterality: N/A;   DIALYSIS/PERMA CATHETER REMOVAL N/A 03/15/2022   Procedure: DIALYSIS/PERMA CATHETER REMOVAL;  Surgeon: Annice Needy, MD;  Location: [name] INV[facility] CV [facility];  Service: Cardiovascular;  Laterality: N/A;   EMBOLIZATION (CATH LAB) Right 02/06/2021   Procedure: EMBOLIZATION;  Surgeon: Annice Needy, MD;  Location: [name] INVAS[facility] CV [facility];  Service: Cardiovascular;  Laterality: Right;  Right Upper Extremity Dialysis Access, Permcath Placement   ESOPHAGOGASTRODUODENOSCOPY (EGD) WITH PROPOFOL N/A 12/27/2017   Procedure: ESOPHAGOGASTRODUODENOSCOPY (EGD) WITH PROPOFOL;  Surgeon: Toledo, Boykin Nearing, MD;  Location: [name] ENDOSCOPY;  Service: Gastroenterology;  Laterality: N/A;   EYE SURGERY     LEFT HEART CATH AND CORONARY ANGIOGRAPHY N/A 02/26/2020   Procedure: LEFT HEART CATH AND CORONARY ANGIOGRAPHY;  Surgeon: Corky Crafts, MD;  Location: [facility] INVASIVE CV LAB;  Service: Cardiovascular;  Laterality: N/A;   PROSTATE SURGERY  2016   REVISON OF ARTERIOVENOUS FISTULA Right 07/22/2022   Procedure: RESECTION OF ANEURYSMAL RIGHT ARM ARTERIOVENOUS FISTULA;  Surgeon: Annice Needy, MD;  Location: [name] ORS;  Service: Vascular;  Laterality: Right;   TONSILECTOMY/ADENOIDECTOMY WITH MYRINGOTOMY     TONSILLECTOMY     UPPER EXTREMITY ANGIOGRAPHY Right 11/26/2020   Procedure: Upper Extremity Angiography;  Surgeon: Annice Needy, MD;  Location: [name] INVASIVE CV LAB;  Service: Cardiovascular;  Laterality: Right;   UPPER EXTREMITY ANGIOGRAPHY Right 02/05/2021   Procedure: UPPER EXTREMITY  ANGIOGRAPHY;  Surgeon: Annice Needy, MD;  Location: [name] INVASIVE CV LAB;  Service: Cardiovascular;  Laterality: Right;     reports that he has never smoked. He has never used smokeless tobacco. He reports that he does not drink alcohol and does not use drugs.  Allergies  Allergen Reactions   Promethazine Diarrhea and Other (See Comments)    Muscle cramps   Promethazine Hcl Other (See Comments)    Cramping all over  Other reaction(s): cramp all over   Other     Other reaction(s): Unknown Other reaction(s): Unknown    Family History  Problem Relation Age of Onset   CAD Father    Stroke Father    Diabetes Mellitus II Mother    Kidney failure Mother    Schizophrenia Mother     Prior to Admission medications   Medication Sig Start Date End Date Taking? Authorizing Provider  acetaminophen (TYLENOL) 325 MG tablet  Take 1-2 tablets (325-650 mg total) by mouth every 4 (four) hours as needed for mild pain. 05/04/21  Yes Love, Evlyn Kanner, PA-C  amiodarone (PACERONE) 200 MG tablet Take 1 tablet (200 mg total) by mouth every morning. 08/02/22 08/03/23 Yes Corky Crafts, MD  amLODipine (NORVASC) 5 MG tablet Take 5 mg by mouth daily.   Yes [provider]  ammonium lactate (AMLACTIN) 12 % cream Apply 1 Application topically as needed for dry skin. 05/25/22  Yes Vivi Barrack, DPM  atorvastatin (LIPITOR) 80 MG tablet TAKE ONE TABLET BY MOUTH AT BEDTIME. 05/07/22  Yes Corky Crafts, MD  calcium acetate (PHOSLO) 667 MG capsule Take 2 capsules (1,334 mg total) by mouth 3 (three) times daily with meals. 05/04/21  Yes Love, Evlyn Kanner, PA-C  carvedilol (COREG) 25 MG tablet Take 25 mg by mouth 2 (two) times daily. 06/16/21  Yes [provider]  dorzolamide-timolol (COSOPT) 2-0.5 % ophthalmic solution Place 1 drop into the right eye 2 (two) times daily. 11/15/22 11/15/23 Yes [provider]  levETIRAcetam (KEPPRA) 250 MG tablet Take 1 tablet (250 mg total) by mouth Every  Tuesday,Thursday,and Saturday with dialysis. 04/08/22  Yes Johnson, Clanford L, MD  levETIRAcetam (KEPPRA) 500 MG tablet Take 1 tablet (500 mg total) by mouth daily after breakfast. 04/08/22  Yes Johnson, Clanford L, MD  lidocaine-prilocaine (EMLA) cream Apply topically. 12/24/21  Yes [provider]  amphetamine-dextroamphetamine (ADDERALL XR) 5 MG 24 hr capsule Take 5 mg by mouth daily.    [provider]  fluticasone (FLONASE) 50 MCG/ACT nasal spray Place 2 sprays into both nostrils daily. 10/05/22   Leath-Warren, Sadie Haber, NP  hydrALAZINE (APRESOLINE) 25 MG tablet Take 25 mg by mouth 3 (three) times daily. Patient not taking: Reported on 02/03/2023 03/30/22   [provider]  hydrocortisone (ANUSOL-HC) 2.5 % rectal cream Place 1 Application rectally 2 (two) times daily. Patient not taking: Reported on 02/03/2023 10/05/22   Leath-Warren, Sadie Haber, NP  nitroGLYCERIN (NITROSTAT) 0.4 MG SL tablet Place 1 tablet (0.4 mg total) under the tongue every 5 (five) minutes as needed for chest pain. 03/24/22   Corky Crafts, MD  polyethylene glycol (MIRALAX / GLYCOLAX) 17 g packet Take 17 g by mouth 2 (two) times daily. Patient taking differently: Take 17 g by mouth 2 (two) times daily. New medication from Dr. Burgess Estelle, have to pick up at Pharmacy. 01/27/23 04/27/23  Franky Macho, MD  psyllium (METAMUCIL) 58.6 % packet Take 1 packet by mouth 2 (two) times daily. Patient taking differently: Take 1 packet by mouth 2 (two) times daily. New medication from Dr. Burgess Estelle, have to pick up at Pharmacy. 01/27/23 04/27/23  Franky Macho, MD    Physical Exam: Vitals:   02/03/23 1052 02/03/23 1111  BP: (!) 107/58   Pulse: 61   Resp: 18   Temp: 99.7 F (37.6 C)   TempSrc: Oral   SpO2: 99%   Weight:  82.6 kg  Height:  6\' 3"  (1.905 m)    Constitutional: NAD, calm, comfortable Vitals:   02/03/23 1052 02/03/23 1111  BP: (!) 107/58   Pulse: 61   Resp: 18   Temp: 99.7 F  (37.6 C)   TempSrc: Oral   SpO2: 99%   Weight:  82.6 kg  Height:  6\' 3"  (1.905 m)   Eyes: PERRL, lids and conjunctivae normal ENMT: Mucous membranes are moist. Posterior pharynx clear of any exudate or lesions.Normal dentition.  Neck: normal, supple, no  masses, no thyromegaly Respiratory: clear to auscultation bilaterally, no wheezing, no crackles. Normal respiratory effort. No accessory muscle use.  Cardiovascular: Regular rate and rhythm, no murmurs / rubs / gallops. No extremity edema. 2+ pedal pulses. No carotid bruits.  Abdomen: no tenderness, no masses palpated. No hepatosplenomegaly. Bowel sounds positive.  Musculoskeletal: no clubbing / cyanosis. No joint deformity upper and lower extremities. Good ROM, no contractures. Normal muscle tone.  Skin: He has purulent wound on the lateral side of the right forefoot with some bleeding. Neurologic: CN 2-12 grossly intact. Sensation intact, DTR normal. Strength 5/5 in all 4.  Psychiatric: Normal judgment and insight. Alert and oriented x 3. Normal mood.  Following pictures were taken by wife on Sunday.  However it appears worse and purulent today.       Labs on Admission: I have personally reviewed following labs and imaging studies  CBC: Recent Labs  Lab 02/03/23 1115  WBC 22.3*  NEUTROABS 19.5*  HGB 8.9*  HCT 28.7*  MCV 73.4*  PLT 203   Basic Metabolic Panel: Recent Labs  Lab 02/03/23 1115  NA 141  K 4.4  CL 104  CO2 13*  GLUCOSE 82  BUN 122*  CREATININE 18.57*  CALCIUM 9.6   GFR: Estimated Creatinine Clearance: 5.3 mL/min (A) (by C-G formula based on SCr of 18.57 mg/dL (H)). Liver Function Tests: Recent Labs  Lab 02/03/23 1115  AST 12*  ALT 10  ALKPHOS 65  BILITOT 0.9  PROT 6.9  ALBUMIN 2.7*   No results for input(s): "LIPASE", "AMYLASE" in the last 168 hours. No results for input(s): "AMMONIA" in the last 168 hours. Coagulation Profile: No results for input(s): "INR", "PROTIME" in the last 168  hours. Cardiac Enzymes: No results for input(s): "CKTOTAL", "CKMB", "CKMBINDEX", "TROPONINI" in the last 168 hours. BNP (last 3 results) No results for input(s): "PROBNP" in the last 8760 hours. HbA1C: No results for input(s): "HGBA1C" in the last 72 hours. CBG: Recent Labs  Lab 02/03/23 1117  GLUCAP 84   Lipid Profile: No results for input(s): "CHOL", "HDL", "LDLCALC", "TRIG", "CHOLHDL", "LDLDIRECT" in the last 72 hours. Thyroid Function Tests: No results for input(s): "TSH", "T4TOTAL", "FREET4", "T3FREE", "THYROIDAB" in the last 72 hours. Anemia Panel: No results for input(s): "VITAMINB12", "FOLATE", "FERRITIN", "TIBC", "IRON", "RETICCTPCT" in the last 72 hours. Urine analysis:    Component Value Date/Time   COLORURINE YELLOW (A) 11/07/2017 1855   APPEARANCEUR CLOUDY (A) 11/07/2017 1855   LABSPEC 1.016 11/07/2017 1855   PHURINE 5.0 11/07/2017 1855   GLUCOSEU >=500 (A) 11/07/2017 1855   HGBUR MODERATE (A) 11/07/2017 1855   BILIRUBINUR NEGATIVE 11/07/2017 1855   KETONESUR NEGATIVE 11/07/2017 1855   PROTEINUR >=300 (A) 11/07/2017 1855   NITRITE NEGATIVE 11/07/2017 1855   LEUKOCYTESUR NEGATIVE 11/07/2017 1855    Radiological Exams on Admission: DG Foot Complete Right Result Date: 02/03/2023 CLINICAL DATA:  Right foot wound EXAM: RIGHT FOOT COMPLETE - 3 VIEW COMPARISON:  Earlier same day right foot radiographs, 10/06/2022 FINDINGS: Postsurgical changes from small toe amputation. Diffuse osteopenia. Extensive soft tissue vascular calcifications. Dressing material overlying the lateral forefoot, likely over the reported wound. No discrete soft tissue abnormality is seen. No definite cortical abnormality of the lateral forefoot. IMPRESSION: 1. Postsurgical changes from small toe amputation. No definite cortical abnormality of the lateral forefoot. 2. Diffuse osteopenia. Electronically Signed   By: Agustin Cree M.D.   On: 02/03/2023 12:16    EKG: Independently reviewed.  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  MRI pending.  Continue cefepime and Flagyl.  I have requested ED physician to notify Dr. Ardelle Anton and request official consult.  Paroxysmal atrial fibrillation: Currently in sinus rhythm.  Resume home amiodarone and patient is not on any anticoagulation due to history of intraventricular hemorrhage and 10/2021.  Type 2 diabetes mellitus: His wife says that his diabetes has been "reversed" since he had some sort of surgery for his gastroparesis several years ago and his recent hemoglobin A1c was 4.2.  I will start him on SSI and check hemoglobin A1c.  History of CVA/hyperlipidemia: He is not on any antiplatelets.  Continue atorvastatin.  Essential hypertension: Controlled.  Resume home dose of amlodipine and Coreg.  History of seizure: Resume Keppra.  ESRD on HD: Nephrology consulted.    CAD status post DES to LAD: Asymptomatic.  Interestingly, he is not on any antiplatelets due to intraventricular hemorrhage history last year.  DVT prophylaxis: Heparin Code Status: Full code Family Communication: Wife present at bedside.  Plan of care discussed with patient in length and he verbalized understanding and agreed with it. Disposition Plan: Will need hospitalization for couple of days. Consults called: Nephrology and podiatry  Hughie Closs MD [facility]  *Please note that this is a verbal dictation therefore any spelling or grammatical errors are due to the "[employer]agon [employer] One" system interpretation.  Please page via Amion and do not message via secure chat for urgent patient care  matters. Secure chat can be used for non urgent patient care matters. 02/03/2023, 1:51 PM  To contact the attending provider between 7A-7P or the covering provider during after hours 7P-7A, please log into the web site [email or web address]

## 2023-02-03 NOTE — Progress Notes (Signed)
   02/03/23 1857  Vitals  Temp (!) 97.4 F (36.3 C)  Temp Source Oral  BP 119/66  MAP (mmHg) 81  BP Location Right Arm  BP Method Automatic  Patient Position (if appropriate) Lying  Pulse Rate (!) 57  Pulse Rate Source Monitor  ECG Heart Rate (!) 58  Resp 11  MEWS COLOR  MEWS Score Color Green  Oxygen Therapy  SpO2 97 %  O2 Device Room Air  Pain Assessment  Pain Scale 0-10  Pain Score 0  MEWS Score  MEWS Temp 0  MEWS Systolic 0  MEWS Pulse 0  MEWS RR 1  MEWS LOC 0  MEWS Score 1   Received a pt from ED, vital signs monitored, CCMD notified, call bell in reach, patient is resting comfortably on his bed.

## 2023-02-03 NOTE — ED Triage Notes (Addendum)
Pt to ED from [facility] c/o right foot problem. Pt has hx of right small toe amputation aprox 2 years ago, aprox 1 week ago noticed wound to right foot, progressively getting worse, saw foot doctor today, dx with abscess and told to come to ED. Foot currently bandaged . HX DM, ESRD.  Dialysis Tue, Thur, Sat , did not receive today.

## 2023-02-03 NOTE — Progress Notes (Signed)
Pharmacy Antibiotic Note  [name]. is a 52 y.o. male for which pharmacy has been consulted for cefepime and vancomycin dosing for  wound infection .  Patient with a history of T2DM, AF, HTN, ESRD on HD, seizure disorder, BPH, GERD, gastroparesis, HTN. Patient presenting with rt foot wound.  WBC 22.3; LA 1.1; T 99.7; HR 61; RR 18  Plan: Cefepime 2g given x1 in ED will plan for Cefepime 1g q24hr moving forward (given after HD on HD days) Vancomycin 2g to be given in ED - further dosing pending nephrology plan for HD Monitor WBC, fever, renal function, cultures De-escalate when able F/u Nephrology plan  Height: 6\' 3"  (190.5 cm) Weight: 82.6 kg (182 lb) IBW/kg (Calculated) : 84.5  Temp (24hrs), Avg:99.7 F (37.6 C), Min:99.7 F (37.6 C), Max:99.7 F (37.6 C)  Recent Labs  Lab 02/03/23 1115 02/03/23 1256  WBC 22.3*  --   CREATININE 18.57*  --   LATICACIDVEN  --  1.1    Estimated Creatinine Clearance: 5.3 mL/min (A) (by C-G formula based on SCr of 18.57 mg/dL (H)).    Allergies  Allergen Reactions   Promethazine Diarrhea and Other (See Comments)    Muscle cramps   Promethazine Hcl Other (See Comments)    Cramping all over  Other reaction(s): cramp all over   Other     Other reaction(s): Unknown Other reaction(s): Unknown   Microbiology results: Pending  Thank you for allowing pharmacy to be a part of this patient's care.  Delmar Landau, PharmD, BCPS 02/03/2023 1:55 PM ED Clinical Pharmacist -  [phone]

## 2023-02-03 NOTE — Progress Notes (Signed)
ED Pharmacy Antibiotic Sign Off An antibiotic consult was received from an ED provider for vancomycin and cefepime per pharmacy dosing for wound infection. A chart review was completed to assess appropriateness. ESRD-HD usually TTS  The following one time order(s) were placed:  Vancomycin 2g IV x 1 Cefepime 2g IV x 1  Further antibiotic and/or antibiotic pharmacy consults should be ordered by the admitting provider if indicated.   Thank you for allowing pharmacy to be a part of this patient's care.   Daylene Posey, [facility]  Clinical Pharmacist 02/03/23 12:25 PM

## 2023-02-03 NOTE — ED Provider Notes (Signed)
[place] EMERGENCY DEPARTMENT AT [facility] Provider Note  CSN: [id number] Arrival date & time: 02/03/23 1047  Chief Complaint(s) Foot Problem (right)  HPI [name]. is a 52 y.o. male history of prior stroke, end-stage renal disease on dialysis, CHF, diabetes, presenting to the emergency department with foot wound.  Patient developed foot wound over the past couple days.  Wife noticed today that it was foul-smelling and draining fluid so she called his foot doctor who saw him and was concerned, who recommended coming to the hospital.  He reports that he also feels generalized weakness and lightheadedness, pain, nausea.  Did not go to dialysis today due to acute infectious process.  No fevers.  Had prior fifth digit amputation at the site around a year ago.  The skin is apparently always very dry in that area and wife thinks it may have cracked   Past Medical History Past Medical History:  Diagnosis Date   Acute bilateral cerebral infarction in a watershed distribution [facility]) 04/21/2021   a.) MRI brain 04/21/2021 --> patchy ACUTE cortically-based infarcts within the bilateral high frontal lobes and right parietal lobe   Acute cerebral infarction (HCC) 02/13/2021   a.) MRI brain 02/13/2021: acute LEFT hippocampal infarct   Acute cerebral infarction (HCC) 04/21/2021   a.) MRI brain 04/21/2021: subcentimeter ACUTE infarct within the callosal splenium on the LEFT   Anemia of chronic renal failure    Anxiety    Aortic dilatation (HCC) 11/25/2020   a.) TTE 11/25/2020: Ao root measured 38 mm. b.) TTE 04/22/2021: Ao root measured 44 mm; c. 11/2021 Echo: Ao root 40mm.   Atrial fibrillation (HCC)    a.) CHA2DS2-VASc = 6 (CHF, HTN, CVA x2, prior MI, T2DM). b.) rate/rhythm maintained on oral amiodarone + carvedilol; chronic OAC/AP therapy discontinued following ICH   BPH (benign prostatic hyperplasia)    Cardiac arrest (HCC) 07/26/2021   a.) in setting of hyperkalemia, NSTEMI,  and seizure following missed HD; pulseless and apneic --> required 1 round of CPR prior to ROSC   Cellulitis    Cerebral hemorrhage (HCC) 11/21/2021   a.) CT head 11/21/2021 --> 1.4 x 1.5 x 3.1 cm ACUTE hemorrhage within the inferior aspect of the fourth ventricle, and extending inferiorly through the foramen of foramen of [place] --> occurred in setting of HTN emergency and DOAC/APT-->eliquis/plavix d/c'd.   Cerebral microvascular disease    Chronic diarrhea    Chronic heart failure with preserved ejection fraction (HFpEF) (HCC) 07/19/2017   a.) 06/2017 Echo: EF 75%; b.) 11/2020 Echo: EF 55-60%; c.) 04/2021 Echo: EF 55-60%; d.) 11/2021 Echo: EF 65-70%, GrII DD, nl RV fxn, sev dil LA, large circumferential pericardial eff w/o tamponade, triv MR, AoV sclerosis. Ao root 40mm; e.) TTE 02/28/2025: EF 60-65%, sev LVH, mod posterior LV effusion, AoV sclerosis   Chronic pain of both ankles    Coronary artery disease 02/26/2020   a. 02/2020 Ant STEMI/PCI: LM nl, LAD 28m (3.5x30 Resolute Onyx), RI nl, LCX min irregs, RCA min irregs. EF 65%.   DDD (degenerative disc disease), cervical    Diabetes mellitus, type II (HCC)    Diabetic neuropathy (HCC)    Esophagitis    ESRD (end stage renal disease) on dialysis (HCC)    a.) [facility]; T-Th-Sat   Frequent falls    Gait instability    Gastritis    Gastroparesis    GERD (gastroesophageal reflux disease)    H/O enucleation of left eyeball    Heart palpitations  History of kidney stones    HLD (hyperlipidemia)    Hypertension    Insomnia    a.) uses melatonin PRN   Long term current use of amiodarone    Nausea and vomiting in adult    recurrent   NSTEMI (non-ST elevated myocardial infarction) (HCC) 07/26/2021   a.) hyperkalemic from missed HD; seizures; decompensated to cardiac arrest and required CPR x 1 round prior to ROSC.   Osteomyelitis (HCC)    Pericardial effusion    a. 11/2021 Echo: EF 65-70%, GrII DD, nl RV fxn, sev dil LA, large  circumferential pericardial eff w/o tamponade, triv MR, AoV sclerosis. Ao root 40mm.   Seizure (HCC)    a.) last 07/26/2021 in setting of missed HD --> hyperkalemic at 6.5 --> pulseless/apneic and required CPR; discharged home on levetiracetam.   ST elevation myocardial infarction (STEMI) of anterior wall (HCC) 02/26/2020   a.) LHC/PCI 02/26/2020 --> EF 65%; LVEDP 11 mmHg; 90% mLAD (3.5 x 20 mm Resolute Onyx DES x 1)   Patient Active Problem List   Diagnosis Date Noted   Diabetic foot ulcer (HCC) 02/03/2023   Noninfectious gastroenteritis 01/27/2023   Low fecal elastase level 10/29/2022   Rectal pain, chronic 10/29/2022   Encephalopathy 04/06/2022   Preoperative clearance 02/27/2022   Long term current use of anticoagulant 02/26/2022   Acute osteomyelitis of toe of right foot with gangrene (HCC) 02/26/2022   Acute on chronic anemia 02/26/2022   H/O spontaneous intraventricular intracranial hemorrhage 10/2021 02/26/2022   History of CVA (cerebrovascular accident) 02/26/2022   H/O enucleation of left eyeball 02/26/2022   PVD (peripheral vascular disease) (HCC) 02/26/2022   History of seizure 02/26/2022   Protein-calorie malnutrition, severe 12/04/2021   Pressure injury of skin 11/29/2021   Hypertensive emergency    Bacteremia associated with intravascular line (HCC)    Intracranial bleed (HCC) 11/21/2021   ICH (intracerebral hemorrhage) (HCC) 11/21/2021   Seizure (HCC) 07/26/2021   Seasonal allergic rhinitis 07/08/2021   Immunization due 07/08/2021   Hyperglycemia due to type 2 diabetes mellitus (HCC) 07/08/2021   Diabetic renal disease (HCC) 07/08/2021   Aspiration pneumonia (HCC) 06/27/2021   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 difficile colitis 05/05/2021   Cerebral ischemic stroke due to global hypoperfusion with watershed infarct [facility]) 04/24/2021   Peritonitis (HCC) 04/22/2021   Hypertensive urgency 04/21/2021   Acute Bil  CVAs of Bil Frontal amd Rt Parietal  in Watershed Distribution  04/21/2021   Exposure of orbital implant 03/16/2021   CVA (cerebral vascular accident) (HCC) 02/13/2021   Steal syndrome as complication of dialysis access (HCC) 02/06/2021   Folate deficiency 02/06/2021   Hyperkalemia 02/05/2021   Hyperphosphatemia 02/05/2021   Hypermagnesemia 02/05/2021   Coronary artery disease 02/05/2021   Hypernatremia 02/05/2021   Wound infection 02/04/2021   Infected wound 01/29/2021   Wound dehiscence, surgical, initial encounter 01/29/2021   ESRD on hemodialysis (HCC)    Prolonged Q-T interval on ECG    Anemia due to chronic kidney disease, on chronic dialysis (HCC)    Atrial fibrillation with RVR (HCC) 12/08/2020   Finger amputation, 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 12/08/2020   Atrial fibrillation with rapid ventricular response (HCC)    Postural dizziness with presyncope 11/24/2020   Elevated troponin 11/24/2020   Left leg cellulitis 11/24/2020   Frequent falls 11/24/2020   Osteomyelitis of finger of right hand (HCC) 11/24/2020   Paroxysmal atrial fibrillation (HCC) 11/24/2020   Hypotension 11/24/2020   NSTEMI (non-ST elevated myocardial infarction) (HCC) 11/24/2020   Abnormal  gait 08/19/2020   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) 08/19/2020   Anxiety state 08/19/2020   Callosity 08/19/2020   ED (erectile dysfunction) of organic origin 08/19/2020   Heart failure (HCC) 08/19/2020   Long term (current) use of insulin (HCC) 08/19/2020   Myalgia 08/19/2020   Obstructive uropathy 08/19/2020   Reactive depression 08/19/2020   CAD/ H/o prior STEMI 02/2020 s/p DES to LAD 08/19/2020   ESRD (end stage renal disease) (HCC) 08/19/2020   Blindness of left eye 08/15/2020   Combined forms of age-related cataract of right eye 04/17/2020   Supraventricular tachycardia (HCC) 02/26/2020   Acute anterior wall MI (HCC)    Benign prostatic hyperplasia with lower urinary tract symptoms 11/07/2019   Fluid overload, unspecified 08/15/2019   Hypertension secondary to other renal  disorders 07/20/2019   NVG (neovascular glaucoma), left, 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   Anemia 06/05/2019   Aftercare including intermittent dialysis (HCC) 05/15/2019   Moderate protein-calorie malnutrition (HCC) 05/14/2019   Anaphylactic reaction due to adverse effect of correct drug or medicament properly administered, initial encounter 05/08/2019   Chronic anemia 05/07/2019   Chest pain, unspecified 05/07/2019   Coagulation defect, unspecified (HCC) 05/07/2019   Disorder of phosphorus metabolism, unspecified 05/07/2019   Encounter for fitting and adjustment of extracorporeal dialysis catheter (HCC) 05/07/2019   Iron deficiency anemia, unspecified 05/07/2019   Pain, unspecified 05/07/2019   Pruritus, unspecified 05/07/2019   Dizziness 04/06/2019   Metabolic acidosis 04/06/2019   Syncope 04/06/2019   Urinary retention 04/06/2019   Hypertensive chronic kidney disease with stage 1 through stage 4 chronic kidney disease, or unspecified chronic kidney disease 11/28/2018   Edema 11/28/2018   Hypokalemia 11/28/2018   Hypo-osmolality and hyponatremia 11/28/2018   Proteinuria 11/28/2018   Secondary hyperparathyroidism of renal origin (HCC) 11/28/2018   Lymphedema 01/09/2018   Chronic Grade 2 diastolic heart failure/EF 55 to 60% 12/13/2017   Vomiting 11/28/2017   Acute on chronic heart failure (HCC) 11/23/2017   Diabetes mellitus, type II (HCC) 11/23/2017   Diabetic retinopathy (HCC) 07/05/2017   Uncontrolled type 2 diabetes mellitus with hyperglycemia, with long-term current use of insulin (HCC) 04/20/2016   Osteomyelitis of toe of left foot (HCC) 03/15/2016   Acute renal failure with tubular necrosis (HCC) 06/23/2015   Diarrhea 06/23/2015   Mixed hyperlipidemia    Essential hypertension, benign    Heart palpitations 05/06/2015   Sepsis (HCC) 03/07/2015   Home Medication(s) Prior to Admission medications   Medication Sig Start Date End Date Taking? Authorizing Provider   acetaminophen (TYLENOL) 325 MG tablet Take 1-2 tablets (325-650 mg total) by mouth every 4 (four) hours as needed for mild pain. 05/04/21  Yes Love, Evlyn Kanner, PA-C  amiodarone (PACERONE) 200 MG tablet Take 1 tablet (200 mg total) by mouth every morning. 08/02/22 08/03/23 Yes Corky Crafts, MD  amLODipine (NORVASC) 5 MG tablet Take 5 mg by mouth daily.   Yes [provider]  ammonium lactate (AMLACTIN) 12 % cream Apply 1 Application topically as needed for dry skin. 05/25/22  Yes Vivi Barrack, DPM  atorvastatin (LIPITOR) 80 MG tablet TAKE ONE TABLET BY MOUTH AT BEDTIME. 05/07/22  Yes Corky Crafts, MD  calcium acetate (PHOSLO) 667 MG capsule Take 2 capsules (1,334 mg total) by mouth 3 (three) times daily with meals. 05/04/21  Yes Love, Evlyn Kanner, PA-C  carvedilol (COREG) 25 MG tablet Take 25 mg by mouth 2 (two) times daily. 06/16/21  Yes [provider]  dorzolamide-timolol (COSOPT) 2-0.5 %  ophthalmic solution Place 1 drop into the right eye 2 (two) times daily. 11/15/22 11/15/23 Yes [provider]  levETIRAcetam (KEPPRA) 250 MG tablet Take 1 tablet (250 mg total) by mouth Every Tuesday,Thursday,and Saturday with dialysis. 04/08/22  Yes Johnson, Clanford L, MD  levETIRAcetam (KEPPRA) 500 MG tablet Take 1 tablet (500 mg total) by mouth daily after breakfast. 04/08/22  Yes Johnson, Clanford L, MD  lidocaine-prilocaine (EMLA) cream Apply topically. 12/24/21  Yes [provider]  amphetamine-dextroamphetamine (ADDERALL XR) 5 MG 24 hr capsule Take 5 mg by mouth daily.    [provider]  fluticasone (FLONASE) 50 MCG/ACT nasal spray Place 2 sprays into both nostrils daily. 10/05/22   Leath-Warren, Sadie Haber, NP  hydrALAZINE (APRESOLINE) 25 MG tablet Take 25 mg by mouth 3 (three) times daily. Patient not taking: Reported on 02/03/2023 03/30/22   [provider]  hydrocortisone (ANUSOL-HC) 2.5 % rectal cream Place 1 Application rectally 2 (two) times  daily. Patient not taking: Reported on 02/03/2023 10/05/22   Leath-Warren, Sadie Haber, NP  nitroGLYCERIN (NITROSTAT) 0.4 MG SL tablet Place 1 tablet (0.4 mg total) under the tongue every 5 (five) minutes as needed for chest pain. 03/24/22   Corky Crafts, MD  polyethylene glycol (MIRALAX / GLYCOLAX) 17 g packet Take 17 g by mouth 2 (two) times daily. Patient taking differently: Take 17 g by mouth 2 (two) times daily. New medication from Dr. Burgess Estelle, have to pick up at Pharmacy. 01/27/23 04/27/23  Franky Macho, MD  psyllium (METAMUCIL) 58.6 % packet Take 1 packet by mouth 2 (two) times daily. Patient taking differently: Take 1 packet by mouth 2 (two) times daily. New medication from Dr. Burgess Estelle, have to pick up at Pharmacy. 01/27/23 04/27/23  Franky Macho, MD                                                                                                                                    Past Surgical History Past Surgical History:  Procedure Laterality Date   A/V FISTULAGRAM Left 08/31/2021   Procedure: A/V Fistulagram;  Surgeon: Annice Needy, MD;  Location: [name] INVASIVE CV LAB;  Service: Cardiovascular;  Laterality: Left;   A/V FISTULAGRAM Left 01/25/2022   Procedure: A/V Fistulagram;  Surgeon: Annice Needy, MD;  Location: [name] INVASIVE CV LAB;  Service: Cardiovascular;  Laterality: Left;   A/V FISTULAGRAM Right 10/18/2022   Procedure: A/V Fistulagram;  Surgeon: Annice Needy, MD;  Location: [name] INVASIVE CV LAB;  Service: Cardiovascular;  Laterality: Right;   AMPUTATION Left 03/16/2016   Procedure: AMPUTATION DIGIT LEFT HALLUX;  Surgeon: Vivi Barrack, DPM;  Location: [facility] OR;  Service: Podiatry;  Laterality: Left;  can start around 5    AMPUTATION Right 02/09/2021   Procedure: AMPUTATION DIGIT;  Surgeon: Marlyne Beards, MD;  Location: [facility] OR;  Service: Orthopedics;  Laterality: Right;   AMPUTATION TOE Right 02/28/2022   Procedure:  AMPUTATION TOE;  Surgeon: Louann Sjogren, DPM;   Location: [name] ORS;  Service: Podiatry;  Laterality: Right;  right fifth toe   ARTHROSCOPIC REPAIR ACL Left    AV FISTULA PLACEMENT Right 05/31/2019   Procedure: Brachiocephalic AV fistula creation;  Surgeon: Annice Needy, MD;  Location: [name] ORS;  Service: Vascular;  Laterality: Right;   AV FISTULA PLACEMENT Left 08/13/2021   Procedure: ARTERIOVENOUS (AV) FISTULA CREATION (BRACHIALCEPHALIC);  Surgeon: Annice Needy, MD;  Location: [name] ORS;  Service: Vascular;  Laterality: Left;   COLONOSCOPY WITH PROPOFOL N/A 10/28/2015   Procedure: COLONOSCOPY WITH PROPOFOL;  Surgeon: Christena Deem, MD;  Location: [facility] ENDOSCOPY;  Service: Endoscopy;  Laterality: N/A;   COLONOSCOPY WITH PROPOFOL N/A 10/29/2015   Procedure: COLONOSCOPY WITH PROPOFOL;  Surgeon: Christena Deem, MD;  Location[facility] ENDOSCOPY;  Service: Endoscopy;  Laterality: N/A;   CORONARY ULTRASOUND/IVUS N/A 02/26/2020   Procedure: Intravascular Ultrasound/IVUS;  Surgeon: Corky Crafts, MD;  Location: [facility] INVASIVE CV LAB;  Service: Cardiovascular;  Laterality: N/A;   CORONARY/GRAFT ACUTE MI REVASCULARIZATION N/A 02/26/2020   Procedure: Coronary/Graft Acute MI Revascularization;  Surgeon: Corky Crafts, MD;  Location: [facility] INVASIVE CV LAB;  Service: Cardiovascular;  Laterality: N/A;   DIALYSIS/PERMA CATHETER INSERTION Right 04/26/2019   Perm Cath    DIALYSIS/PERMA CATHETER INSERTION N/A 04/26/2019   Procedure: DIALYSIS/PERMA CATHETER INSERTION;  Surgeon: Annice Needy, MD;  Location: [name] INVASIVE CV LAB;  Service: Cardiovascular;  Laterality: N/A;   DIALYSIS/PERMA CATHETER INSERTION N/A 05/25/2021   Procedure: DIALYSIS/PERMA CATHETER INSERTION;  Surgeon: Annice Needy, MD;  Location: [name] INVASIVE CV LAB;  Service: Cardiovascular;  Laterality: N/A;   DIALYSIS/PERMA CATHETER INSERTION N/A 08/31/2021   Procedure: DIALYSIS/PERMA CATHETER INSERTION;  Surgeon: Annice Needy, MD;  Location: [name] INVASIVE CV LAB;  Service: Cardiovascular;   Laterality: N/A;   DIALYSIS/PERMA CATHETER REMOVAL N/A 08/15/2019   Procedure: DIALYSIS/PERMA CATHETER REMOVAL;  Surgeon: Renford Dills, MD;  Location: [name] INVASIVE CV LAB;  Service: Cardiovascular;  Laterality: N/A;   DIALYSIS/PERMA CATHETER REMOVAL N/A 03/15/2022   Procedure: DIALYSIS/PERMA CATHETER REMOVAL;  Surgeon: Annice Needy, MD;  Location: [name] INVASIVE CV LAB;  Service: Cardiovascular;  Laterality: N/A;   EMBOLIZATION (CATH LAB) Right 02/06/2021   Procedure: EMBOLIZATION;  Surgeon: Annice Needy, MD;  Location: [name] INVASIVE CV LAB;  Service: Cardiovascular;  Laterality: Right;  Right Upper Extremity Dialysis Access, Permcath Placement   ESOPHAGOGASTRODUODENOSCOPY (EGD) WITH PROPOFOL N/A 12/27/2017   Procedure: ESOPHAGOGASTRODUODENOSCOPY (EGD) WITH PROPOFOL;  Surgeon: Toledo, Boykin Nearing, MD;  Location: [name] ENDOSCOPY;  Service: Gastroenterology;  Laterality: N/A;   EYE SURGERY     LEFT HEART CATH AND CORONARY ANGIOGRAPHY N/A 02/26/2020   Procedure: LEFT HEART CATH AND CORONARY ANGIOGRAPHY;  Surgeon: Corky Crafts, MD;  Location: [facility] INVASIVE CV LAB;  Service: Cardiovascular;  Laterality: N/A;   PROSTATE SURGERY  2016   REVISON OF ARTERIOVENOUS FISTULA Right 07/22/2022   Procedure: RESECTION OF ANEURYSMAL RIGHT ARM ARTERIOVENOUS FISTULA;  Surgeon: Annice Needy, MD;  Location: [name] ORS;  Service: Vascular;  Laterality: Right;   TONSILECTOMY/ADENOIDECTOMY WITH MYRINGOTOMY     TONSILLECTOMY     UPPER EXTREMITY ANGIOGRAPHY Right 11/26/2020   Procedure: Upper Extremity Angiography;  Surgeon: Annice Needy, MD;  Location: [name] INVASIVE CV LAB;  Service: Cardiovascular;  Laterality: Right;   UPPER EXTREMITY ANGIOGRAPHY Right 02/05/2021   Procedure: UPPER EXTREMITY ANGIOGRAPHY;  Surgeon: Annice Needy, MD;  Location: [name] INVASIVE CV LAB;  Service: Cardiovascular;  Laterality: Right;   Family History Family History  Problem Relation Age of Onset   CAD Father    Stroke Father     Diabetes Mellitus II Mother    Kidney failure Mother    Schizophrenia Mother     Social History Social History   Tobacco Use   Smoking status: Never   Smokeless tobacco: Never  Vaping Use   Vaping status: Never Used  Substance Use Topics   Alcohol use: No   Drug use: No   Allergies Promethazine, Promethazine hcl, and Other  Review of Systems Review of Systems  All other systems reviewed and are negative.   Physical Exam Vital Signs  I have reviewed the triage vital signs BP (!) 107/58 (BP Location: Right Arm)   Pulse 61   Temp 99.7 F (37.6 C) (Oral)   Resp 18   Ht 6\' 3"  (1.905 m)   Wt 82.6 kg   SpO2 99%   BMI 22.75 kg/m  Physical Exam Vitals and nursing note reviewed.  Constitutional:      General: He is not in acute distress.    Appearance: Normal appearance.  HENT:     Mouth/Throat:     Mouth: Mucous membranes are moist.  Eyes:     Conjunctiva/sclera: Conjunctivae normal.  Cardiovascular:     Rate and Rhythm: Normal rate and regular rhythm.  Pulmonary:     Effort: Pulmonary effort is normal. No respiratory distress.     Breath sounds: Normal breath sounds.  Abdominal:     General: Abdomen is flat.     Palpations: Abdomen is soft.     Tenderness: There is no abdominal tenderness.  Musculoskeletal:     Right lower leg: No edema.     Left lower leg: No edema.     Comments: Wound to the lateral aspect of the distal aspect of the right foot around site of fifth toe amputation.  Bloody and purulent drainage coming from the wound.  Skin:    General: Skin is warm and dry.     Capillary Refill: Capillary refill takes less than 2 seconds.  Neurological:     Mental Status: He is alert and oriented to person, place, and time. Mental status is at baseline.  Psychiatric:        Mood and Affect: Mood normal.        Behavior: Behavior normal.     ED Results and Treatments Labs (all labs ordered are listed, but only abnormal results are displayed) Labs  Reviewed  CBC WITH DIFFERENTIAL/PLATELET - Abnormal; Notable for the following components:      Result Value   WBC 22.3 (*)    RBC 3.91 (*)    Hemoglobin 8.9 (*)    HCT 28.7 (*)    MCV 73.4 (*)    MCH 22.8 (*)    RDW 20.5 (*)    Neutro Abs 19.5 (*)    Monocytes Absolute 1.7 (*)    Abs Immature Granulocytes 0.37 (*)    All other components within normal limits  COMPREHENSIVE METABOLIC PANEL - Abnormal; Notable for the following components:   CO2 13 (*)    BUN 122 (*)    Creatinine, Ser 18.57 (*)    Albumin 2.7 (*)    AST 12 (*)    GFR, Estimated 3 (*)    Anion gap 24 (*)    All other components within normal limits  CULTURE, BLOOD (ROUTINE X 2)  CULTURE, BLOOD (ROUTINE X 2)  MAGNESIUM  CBG MONITORING, ED  I-STAT CG4 LACTIC ACID, ED  I-STAT CG4 LACTIC ACID, ED                                                                                                                          Radiology DG Foot Complete Right Result Date: 02/03/2023 CLINICAL DATA:  Right foot wound EXAM: RIGHT FOOT COMPLETE - 3 VIEW COMPARISON:  Earlier same day right foot radiographs, 10/06/2022 FINDINGS: Postsurgical changes from small toe amputation. Diffuse osteopenia. Extensive soft tissue vascular calcifications. Dressing material overlying the lateral forefoot, likely over the reported wound. No discrete soft tissue abnormality is seen. No definite cortical abnormality of the lateral forefoot. IMPRESSION: 1. Postsurgical changes from small toe amputation. No definite cortical abnormality of the lateral forefoot. 2. Diffuse osteopenia. Electronically Signed   By: Agustin Cree M.D.   On: 02/03/2023 12:16    Pertinent labs & imaging results that were available during my care of the patient were reviewed by me and considered in my medical decision making (see MDM for details).  Medications Ordered in ED Medications  metroNIDAZOLE (FLAGYL) IVPB 500 mg (500 mg Intravenous New Bag/Given 02/03/23 1303)   vancomycin (VANCOCIN) IVPB 1000 mg/200 mL premix (1,000 mg Intravenous New Bag/Given 02/03/23 1307)    Followed by  vancomycin (VANCOCIN) IVPB 1000 mg/200 mL premix (has no administration in time range)  HYDROmorphone (DILAUDID) injection 0.5 mg (has no administration in time range)  ondansetron (ZOFRAN) injection 4 mg (has no administration in time range)  amiodarone (PACERONE) tablet 200 mg (has no administration in time range)  amLODipine (NORVASC) tablet 5 mg (has no administration in time range)  amphetamine-dextroamphetamine (ADDERALL XR) 24 hr capsule 5 mg (has no administration in time range)  atorvastatin (LIPITOR) tablet 80 mg (has no administration in time range)  calcium acetate (PHOSLO) capsule 1,334 mg (has no administration in time range)  carvedilol (COREG) tablet 25 mg (has no administration in time range)  dorzolamide-timolol (COSOPT) 2-0.5 % ophthalmic solution 1 drop (has no administration in time range)  levETIRAcetam (KEPPRA) tablet 250 mg (has no administration in time range)  levETIRAcetam (KEPPRA) tablet 500 mg (has no administration in time range)  sodium chloride flush (NS) 0.9 % injection 3 mL (has no administration in time range)  acetaminophen (TYLENOL) tablet 650 mg (has no administration in time range)    Or  acetaminophen (TYLENOL) suppository 650 mg (has no administration in time range)  ceFEPIme (MAXIPIME) 2 g in sodium chloride 0.9 % 100 mL IVPB (0 g Intravenous Stopped 02/03/23 1352)  sodium chloride 0.9 % bolus 500 mL (0 mLs Intravenous Stopped 02/03/23 1352)  Procedures Procedures  (including critical care time)  Medical Decision Making / ED Course   MDM:  52 year old male presenting to the emergency department with foot infection.  On exam, patient has obvious wound to the foot.  There is purulent drainage.  Is  very foul-smelling.  Definitely appears to be acutely infected.  He will need to be admitted for further management including possible surgical intervention.  Will order MRI.  X-ray with no evidence of acute osteomyelitis.  He does have a leukocytosis on labs.  His labs are also notable for acidosis and anion gap likely in the setting of uremia.  He did miss dialysis today and needs dialysis.  Discussed with Dr. Arta Silence who will see the patient for dialysis.  Will admit to the hospital.  Clinical Course as of 02/03/23 1356  Thu Feb 03, 2023  1355 Discussed with Dr. Jacqulyn Bath who will admit the patient  [WS]    Clinical Course User Index [WS] Lonell Grandchild, MD     Additional history obtained: -Additional history obtained from spouse -External records from outside source obtained and reviewed including: Chart review including previous notes, labs, imaging, consultation notes including prior notes    Lab Tests: -I ordered, reviewed, and interpreted labs.   The pertinent results include:   Labs Reviewed  CBC WITH DIFFERENTIAL/PLATELET - Abnormal; Notable for the following components:      Result Value   WBC 22.3 (*)    RBC 3.91 (*)    Hemoglobin 8.9 (*)    HCT 28.7 (*)    MCV 73.4 (*)    MCH 22.8 (*)    RDW 20.5 (*)    Neutro Abs 19.5 (*)    Monocytes Absolute 1.7 (*)    Abs Immature Granulocytes 0.37 (*)    All other components within normal limits  COMPREHENSIVE METABOLIC PANEL - Abnormal; Notable for the following components:   CO2 13 (*)    BUN 122 (*)    Creatinine, Ser 18.57 (*)    Albumin 2.7 (*)    AST 12 (*)    GFR, Estimated 3 (*)    Anion gap 24 (*)    All other components within normal limits  CULTURE, BLOOD (ROUTINE X 2)  CULTURE, BLOOD (ROUTINE X 2)  MAGNESIUM  CBG MONITORING, ED  I-STAT CG4 LACTIC ACID, ED  I-STAT CG4 LACTIC ACID, ED    Notable for leukocytosis, uremia, normal lactic   Imaging Studies ordered: I ordered imaging studies including XR  foot On my interpretation imaging demonstrates no acute process I independently visualized and interpreted imaging. I agree with the radiologist interpretation   Medicines ordered and prescription drug management: Meds ordered this encounter  Medications   DISCONTD: sodium chloride 0.9 % bolus 1,000 mL   metroNIDAZOLE (FLAGYL) IVPB 500 mg    Antibiotic Indication::   Other Indication (list below)   ceFEPIme (MAXIPIME) 2 g in sodium chloride 0.9 % 100 mL IVPB    Antibiotic Indication::   Wound Infection   FOLLOWED BY Linked Order Group    vancomycin (VANCOCIN) IVPB 1000 mg/200 mL premix     Indication::   Wound Infection    vancomycin (VANCOCIN) IVPB 1000 mg/200 mL premix     Indication::   Wound Infection   sodium chloride 0.9 % bolus 500 mL   HYDROmorphone (DILAUDID) injection 0.5 mg   ondansetron (ZOFRAN) injection 4 mg   DISCONTD: acetaminophen (TYLENOL) tablet 325-650 mg   amiodarone (PACERONE) tablet 200 mg  amLODipine (NORVASC) tablet 5 mg   amphetamine-dextroamphetamine (ADDERALL XR) 24 hr capsule 5 mg    Refill:  0   atorvastatin (LIPITOR) tablet 80 mg   calcium acetate (PHOSLO) capsule 1,334 mg   carvedilol (COREG) tablet 25 mg   dorzolamide-timolol (COSOPT) 2-0.5 % ophthalmic solution 1 drop   levETIRAcetam (KEPPRA) tablet 250 mg   levETIRAcetam (KEPPRA) tablet 500 mg   sodium chloride flush (NS) 0.9 % injection 3 mL   OR Linked Order Group    acetaminophen (TYLENOL) tablet 650 mg    acetaminophen (TYLENOL) suppository 650 mg    -I have reviewed the patients home medicines and have made adjustments as needed   Consultations Obtained: I requested consultation with the hospitalist,  and discussed lab and imaging findings as well as pertinent plan - they recommend: admission   Cardiac Monitoring: The patient was maintained on a cardiac monitor.  I personally viewed and interpreted the cardiac monitored which showed an underlying rhythm of:  NSR  Reevaluation: After the interventions noted above, I reevaluated the patient and found that their symptoms have improved  Co morbidities that complicate the patient evaluation  Past Medical History:  Diagnosis Date   Acute bilateral cerebral infarction in a watershed distribution [facility]) 04/21/2021   a.) MRI brain 04/21/2021 --> patchy ACUTE cortically-based infarcts within the bilateral high frontal lobes and right parietal lobe   Acute cerebral infarction (HCC) 02/13/2021   a.) MRI brain 02/13/2021: acute LEFT hippocampal infarct   Acute cerebral infarction (HCC) 04/21/2021   a.) MRI brain 04/21/2021: subcentimeter ACUTE infarct within the callosal splenium on the LEFT   Anemia of chronic renal failure    Anxiety    Aortic dilatation (HCC) 11/25/2020   a.) TTE 11/25/2020: Ao root measured 38 mm. b.) TTE 04/22/2021: Ao root measured 44 mm; c. 11/2021 Echo: Ao root 40mm.   Atrial fibrillation (HCC)    a.) CHA2DS2-VASc = 6 (CHF, HTN, CVA x2, prior MI, T2DM). b.) rate/rhythm maintained on oral amiodarone + carvedilol; chronic OAC/AP therapy discontinued following ICH   BPH (benign prostatic hyperplasia)    Cardiac arrest (HCC) 07/26/2021   a.) in setting of hyperkalemia, NSTEMI, and seizure following missed HD; pulseless and apneic --> required 1 round of CPR prior to ROSC   Cellulitis    Cerebral hemorrhage (HCC) 11/21/2021   a.) CT head 11/21/2021 --> 1.4 x 1.5 x 3.1 cm ACUTE hemorrhage within the inferior aspect of the fourth ventricle, and extending inferiorly through the foramen of foramen of [place] --> occurred in setting of HTN emergency and DOAC/APT-->eliquis/plavix d/c'd.   Cerebral microvascular disease    Chronic diarrhea    Chronic heart failure with preserved ejection fraction (HFpEF) (HCC) 07/19/2017   a.) 06/2017 Echo: EF 75%; b.) 11/2020 Echo: EF 55-60%; c.) 04/2021 Echo: EF 55-60%; d.) 11/2021 Echo: EF 65-70%, GrII DD, nl RV fxn, sev dil LA, large circumferential  pericardial eff w/o tamponade, triv MR, AoV sclerosis. Ao root 40mm; e.) TTE 02/28/2025: EF 60-65%, sev LVH, mod posterior LV effusion, AoV sclerosis   Chronic pain of both ankles    Coronary artery disease 02/26/2020   a. 02/2020 Ant STEMI/PCI: LM nl, LAD 64m (3.5x30 Resolute Onyx), RI nl, LCX min irregs, RCA min irregs. EF 65%.   DDD (degenerative disc disease), cervical    Diabetes mellitus, type II (HCC)    Diabetic neuropathy (HCC)    Esophagitis    ESRD (end stage renal disease) on dialysis (HCC)    a.)  [place]; T-Th-Sat   Frequent falls    Gait instability    Gastritis    Gastroparesis    GERD (gastroesophageal reflux disease)    H/O enucleation of left eyeball    Heart palpitations    History of kidney stones    HLD (hyperlipidemia)    Hypertension    Insomnia    a.) uses melatonin PRN   Long term current use of amiodarone    Nausea and vomiting in adult    recurrent   NSTEMI (non-ST elevated myocardial infarction) (HCC) 07/26/2021   a.) hyperkalemic from missed HD; seizures; decompensated to cardiac arrest and required CPR x 1 round prior to ROSC.   Osteomyelitis (HCC)    Pericardial effusion    a. 11/2021 Echo: EF 65-70%, GrII DD, nl RV fxn, sev dil LA, large circumferential pericardial eff w/o tamponade, triv MR, AoV sclerosis. Ao root 40mm.   Seizure (HCC)    a.) last 07/26/2021 in setting of missed HD --> hyperkalemic at 6.5 --> pulseless/apneic and required CPR; discharged home on levetiracetam.   ST elevation myocardial infarction (STEMI) of anterior wall (HCC) 02/26/2020   a.) LHC/PCI 02/26/2020 --> EF 65%; LVEDP 11 mmHg; 90% mLAD (3.5 x 20 mm Resolute Onyx DES x 1)      Dispostion: Disposition decision including need for hospitalization was considered, and patient admitted to the hospital.    Final Clinical Impression(s) / ED Diagnoses Final diagnoses:  Diabetic foot infection (HCC)     This chart was dictated using voice recognition software.  Despite  best efforts to proofread,  errors can occur which can change the documentation meaning.    Lonell Grandchild, MD 02/03/23 1356

## 2023-02-03 NOTE — ED Notes (Signed)
ED TO INPATIENT HANDOFF REPORT  ED Nurse Name and Phone #: Percival Spanish [phone]  S Name/Age/Gender [name]. 52 y.o. male Room/Bed: 031C/031C  Code Status   Code Status: Full Code  Home/SNF/Other Home Patient oriented to: self, place, time, and situation Is this baseline? Yes   Triage Complete: Triage complete  Chief Complaint Diabetic foot ulcer (HCC) [F62.130, L97.509]  Triage Note Pt to ED from [facility] c/o right foot problem. Pt has hx of right small toe amputation aprox 2 years ago, aprox 1 week ago noticed wound to right foot, progressively getting worse, saw foot doctor today, dx with abscess and told to come to ED. Foot currently bandaged . HX DM, ESRD.  Dialysis Tue, Thur, Sat , did not receive today.    Allergies Allergies  Allergen Reactions   Promethazine Diarrhea and Other (See Comments)    Muscle cramps   Promethazine Hcl Other (See Comments)    Cramping all over  Other reaction(s): cramp all over   Other     Other reaction(s): Unknown Other reaction(s): Unknown    Level of Care/Admitting Diagnosis ED Disposition     ED Disposition  Admit   Condition  --   Comment  Hospital Area: [facility] [[id number]]  Level of Care: Progressive [102]  Admit to Progressive based on following criteria: NEPHROLOGY stable condition requiring close monitoring for AKI, requiring Hemodialysis or Peritoneal Dialysis either from expected electrolyte imbalance, acidosis, or fluid overload that can be managed by NIPPV or high flow oxygen.  Admit to Progressive based on following criteria: MULTISYSTEM THREATS such as stable sepsis, metabolic/electrolyte imbalance with or without encephalopathy that is responding to early treatment.  May admit patient to [facility] or [facility] if equivalent level of care is available:: No  Covid Evaluation: Asymptomatic - no recent exposure (last 10 days) testing not required  Diagnosis: Diabetic foot ulcer  [facility]) [[id number]]  Admitting Physician: Hughie Closs [[id number]]  Attending Physician: Hughie Closs [phone]  Certification:: I certify this patient will need inpatient services for at least 2 midnights  Expected Medical Readiness: 02/05/2023          B Medical/Surgery History Past Medical History:  Diagnosis Date   Acute bilateral cerebral infarction in a watershed distribution [facility]) 04/21/2021   a.) MRI brain 04/21/2021 --> patchy ACUTE cortically-based infarcts within the bilateral high frontal lobes and right parietal lobe   Acute cerebral infarction (HCC) 02/13/2021   a.) MRI brain 02/13/2021: acute LEFT hippocampal infarct   Acute cerebral infarction (HCC) 04/21/2021   a.) MRI brain 04/21/2021: subcentimeter ACUTE infarct within the callosal splenium on the LEFT   Anemia of chronic renal failure    Anxiety    Aortic dilatation (HCC) 11/25/2020   a.) TTE 11/25/2020: Ao root measured 38 mm. b.) TTE 04/22/2021: Ao root measured 44 mm; c. 11/2021 Echo: Ao root 40mm.   Atrial fibrillation (HCC)    a.) CHA2DS2-VASc = 6 (CHF, HTN, CVA x2, prior MI, T2DM). b.) rate/rhythm maintained on oral amiodarone + carvedilol; chronic OAC/AP therapy discontinued following ICH   BPH (benign prostatic hyperplasia)    Cardiac arrest (HCC) 07/26/2021   a.) in setting of hyperkalemia, NSTEMI, and seizure following missed HD; pulseless and apneic --> required 1 round of CPR prior to ROSC   Cellulitis    Cerebral hemorrhage (HCC) 11/21/2021   a.) CT head 11/21/2021 --> 1.4 x 1.5 x 3.1 cm ACUTE hemorrhage within the inferior aspect of the fourth ventricle,  and extending inferiorly through the foramen of foramen of [place] --> occurred in setting of HTN emergency and DOAC/APT-->eliquis/plavix d/c'd.   Cerebral microvascular disease    Chronic diarrhea    Chronic heart failure with preserved ejection fraction (HFpEF) (HCC) 07/19/2017   a.) 06/2017 Echo: EF 75%; b.) 11/2020 Echo: EF 55-60%; c.) 04/2021 Echo:  EF 55-60%; d.) 11/2021 Echo: EF 65-70%, GrII DD, nl RV fxn, sev dil LA, large circumferential pericardial eff w/o tamponade, triv MR, AoV sclerosis. Ao root 40mm; e.) TTE 02/28/2025: EF 60-65%, sev LVH, mod posterior LV effusion, AoV sclerosis   Chronic pain of both ankles    Coronary artery disease 02/26/2020   a. 02/2020 Ant STEMI/PCI: LM nl, LAD 75m (3.5x30 Resolute Onyx), RI nl, LCX min irregs, RCA min irregs. EF 65%.   DDD (degenerative disc disease), cervical    Diabetes mellitus, type II (HCC)    Diabetic neuropathy (HCC)    Esophagitis    ESRD (end stage renal disease) on dialysis (HCC)    a.) [facility]; T-Th-Sat   Frequent falls    Gait instability    Gastritis    Gastroparesis    GERD (gastroesophageal reflux disease)    H/O enucleation of left eyeball    Heart palpitations    History of kidney stones    HLD (hyperlipidemia)    Hypertension    Insomnia    a.) uses melatonin PRN   Long term current use of amiodarone    Nausea and vomiting in adult    recurrent   NSTEMI (non-ST elevated myocardial infarction) (HCC) 07/26/2021   a.) hyperkalemic from missed HD; seizures; decompensated to cardiac arrest and required CPR x 1 round prior to ROSC.   Osteomyelitis (HCC)    Pericardial effusion    a. 11/2021 Echo: EF 65-70%, GrII DD, nl RV fxn, sev dil LA, large circumferential pericardial eff w/o tamponade, triv MR, AoV sclerosis. Ao root 40mm.   Seizure (HCC)    a.) last 07/26/2021 in setting of missed HD --> hyperkalemic at 6.5 --> pulseless/apneic and required CPR; discharged home on levetiracetam.   ST elevation myocardial infarction (STEMI) of anterior wall (HCC) 02/26/2020   a.) LHC/PCI 02/26/2020 --> EF 65%; LVEDP 11 mmHg; 90% mLAD (3.5 x 20 mm Resolute Onyx DES x 1)   Past Surgical History:  Procedure Laterality Date   A/V FISTULAGRAM Left 08/31/2021   Procedure: A/V Fistulagram;  Surgeon: Annice Needy, MD;  Location: [name] INVASIVE CV LAB;  Service: Cardiovascular;   Laterality: Left;   A/V FISTULAGRAM Left 01/25/2022   Procedure: A/V Fistulagram;  Surgeon: Annice Needy, MD;  Location: [name] INVASIVE CV LAB;  Service: Cardiovascular;  Laterality: Left;   A/V FISTULAGRAM Right 10/18/2022   Procedure: A/V Fistulagram;  Surgeon: Annice Needy, MD;  Location: [name] INVASIVE CV LAB;  Service: Cardiovascular;  Laterality: Right;   AMPUTATION Left 03/16/2016   Procedure: AMPUTATION DIGIT LEFT HALLUX;  Surgeon: Vivi Barrack, DPM;  Location: [facility] OR;  Service: Podiatry;  Laterality: Left;  can start around 5    AMPUTATION Right 02/09/2021   Procedure: AMPUTATION DIGIT;  Surgeon: Marlyne Beards, MD;  Location: [facility] OR;  Service: Orthopedics;  Laterality: Right;   AMPUTATION TOE Right 02/28/2022   Procedure: AMPUTATION TOE;  Surgeon: Louann Sjogren, DPM;  Location: [name] ORS;  Service: Podiatry;  Laterality: Right;  right fifth toe   ARTHROSCOPIC REPAIR ACL Left    AV FISTULA PLACEMENT Right 05/31/2019   Procedure: Brachiocephalic AV fistula creation;  Surgeon: Annice Needy, MD;  Location: [name] ORS;  Service: Vascular;  Laterality: Right;   AV FISTULA PLACEMENT Left 08/13/2021   Procedure: ARTERIOVENOUS (AV) FISTULA CREATION (BRACHIALCEPHALIC);  Surgeon: Annice Needy, MD;  Location: [name] ORS;  Service: Vascular;  Laterality: Left;   COLONOSCOPY WITH PROPOFOL N/A 10/28/2015   Procedure: COLONOSCOPY WITH PROPOFOL;  Surgeon: Christena Deem, MD;  Location: [facility] ENDOSCOPY;  Service: Endoscopy;  Laterality: N/A;   COLONOSCOPY WITH PROPOFOL N/A 10/29/2015   Procedure: COLONOSCOPY WITH PROPOFOL;  Surgeon: Christena Deem, MD;  Location[facility] ENDOSCOPY;  Service: Endoscopy;  Laterality: N/A;   CORONARY ULTRASOUND/IVUS N/A 02/26/2020   Procedure: Intravascular Ultrasound/IVUS;  Surgeon: Corky Crafts, MD;  Location: [facility] INVASIVE CV LAB;  Service: Cardiovascular;  Laterality: N/A;   CORONARY/GRAFT ACUTE MI REVASCULARIZATION N/A 02/26/2020   Procedure: Coronary/Graft  Acute MI Revascularization;  Surgeon: Corky Crafts, MD;  Location: [facility] INVASIVE CV LAB;  Service: Cardiovascular;  Laterality: N/A;   DIALYSIS/PERMA CATHETER INSERTION Right 04/26/2019   Perm Cath    DIALYSIS/PERMA CATHETER INSERTION N/A 04/26/2019   Procedure: DIALYSIS/PERMA CATHETER INSERTION;  Surgeon: Annice Needy, MD;  Location: [name] INVASIVE [facility] LAB;  Service: Cardiovascular;  Laterality: N/A;   DIALYSIS/PERMA CATHETER INSERTION N/A 05/25/2021   Procedure: DIALYSIS/PERMA CATHETER INSERTION;  Surgeon: Annice Needy, MD;  Location: [name] INV[facility];  Service: Cardiovascular;  Laterality: N/A;   DIALYSIS/PERMA CATHETER INSERTION N/A 08/31/2021   Procedure: DIALYSIS/PERMA CATHETER INSERTION;  Surgeon: Annice Needy, MD;  Location: [name] IN[facility];  Service: Cardiovascular;  Laterality: N/A;   DIALYSIS/PERMA CATHETER REMOVAL N/A 08/15/2019   Procedure: DIALYSIS/PERMA CATHETER REMOVAL;  Surgeon: Renford Dills, MD;  Location: [name];  Service: Cardiovascular;  Laterality: N/A;   DIALYSIS/PERMA CATHETER REMOVAL N/A 03/15/2022   Procedure: DIALYSIS/PERMA CATHETER REMOVAL;  Surgeon: Annice Needy, MD;  Location: [name] INV[facility];  Service: Cardiovascular;  Laterality: N/A;   EMBOLIZATION (CATH LAB) Right 02/06/2021   Procedure: EMBOLIZATION;  Surgeon: Annice Needy, MD;  Location: [name] INVASIVE CV [facility];  Service: Cardiovascular;  Laterality: Right;  Right Upper Extremity Dialysis Access, Permcath Placement   ESOPHAGOGASTRODUODENOSCOPY (EGD) WITH PROPOFOL N/A 12/27/2017   Procedure: ESOPHAGOGASTRODUODENOSCOPY (EGD) WITH PROPOFOL;  Surgeon: Toledo, Boykin Nearing, MD;  Location: [name] ENDOSCOPY;  Service: Gastroenterology;  Laterality: N/A;   EYE SURGERY     LEFT HEART CATH AND CORONARY ANGIOGRAPHY N/A 02/26/2020   Procedure: LEFT HEART CATH AND CORONARY ANGIOGRAPHY;  Surgeon: Corky Crafts, MD;  Location: [facility] INVASIVE CV LAB;  Service: Cardiovascular;  Laterality: N/A;    PROSTATE SURGERY  2016   REVISON OF ARTERIOVENOUS FISTULA Right 07/22/2022   Procedure: RESECTION OF ANEURYSMAL RIGHT ARM ARTERIOVENOUS FISTULA;  Surgeon: Annice Needy, MD;  Location: [name] ORS;  Service: Vascular;  Laterality: Right;   TONSILECTOMY/ADENOIDECTOMY WITH MYRINGOTOMY     TONSILLECTOMY     UPPER EXTREMITY ANGIOGRAPHY Right 11/26/2020   Procedure: Upper Extremity Angiography;  Surgeon: Annice Needy, MD;  Location: [name] INVASIVE CV LAB;  Service: Cardiovascular;  Laterality: Right;   UPPER EXTREMITY ANGIOGRAPHY Right 02/05/2021   Procedure: UPPER EXTREMITY ANGIOGRAPHY;  Surgeon: Annice Needy, MD;  Location: [name] INVASIVE CV LAB;  Service: Cardiovascular;  Laterality: Right;     A IV Location/Drains/Wounds Patient Lines/Drains/Airways Status     Active Line/Drains/Airways     Name Placement date Placement time Site Days   Peripheral IV 02/03/23 20 G Posterior;Right Hand 02/03/23  1256  Hand  less than 1   Peripheral IV 02/03/23 20 G Anterior;Right Forearm 02/03/23  1256  Forearm  less than 1   Fistula / Graft Left Upper arm --  --  Upper arm  --   Pressure Injury 11/28/21 Scrotum Medial Stage 2 -  Partial thickness loss of dermis presenting as a shallow open injury with a red, pink wound bed without slough. round open red area 11/28/21  2200  -- 432   Pressure Injury 11/28/21 Buttocks Left Stage 2 -  Partial thickness loss of dermis presenting as a shallow open injury with a red, pink wound bed without slough. blister 11/28/21  2200  -- 432   Wound / Incision (Open or Dehisced) 04/06/22 Diabetic ulcer Heel Left;Posterior 04/06/22  1034  Heel  303            Intake/Output Last 24 hours  Intake/Output Summary (Last 24 hours) at 02/03/2023 1505 Last data filed at 02/03/2023 1412 Gross per 24 hour  Intake 903.74 ml  Output --  Net 903.74 ml    Labs/Imaging Results for orders placed or performed during the hospital encounter of 02/03/23 (from the past 48 hours)  CBC with  Differential     Status: Abnormal   Collection Time: 02/03/23 11:15 AM  Result Value Ref Range   WBC 22.3 (H) 4.0 - 10.5 K/uL   RBC 3.91 (L) 4.22 - 5.81 MIL/uL   Hemoglobin 8.9 (L) 13.0 - 17.0 g/dL    Comment: Reticulocyte Hemoglobin testing may be clinically indicated, consider ordering this additional test [id number]    HCT 28.7 (L) 39.0 - 52.0 %   MCV 73.4 (L) 80.0 - 100.0 fL   MCH 22.8 (L) 26.0 - 34.0 pg   MCHC 31.0 30.0 - 36.0 g/dL   RDW 46.9 (H) 62.9 - 52.8 %   Platelets 203 150 - 400 K/uL    Comment: REPEATED TO VERIFY   nRBC 0.0 0.0 - 0.2 %   Neutrophils Relative % 87 %   Neutro Abs 19.5 (H) 1.7 - 7.7 K/uL   Lymphocytes Relative 3 %   Lymphs Abs 0.7 0.7 - 4.0 K/uL   Monocytes Relative 8 %   Monocytes Absolute 1.7 (H) 0.1 - 1.0 K/uL   Eosinophils Relative 0 %   Eosinophils Absolute 0.0 0.0 - 0.5 K/uL   Basophils Relative 0 %   Basophils Absolute 0.0 0.0 - 0.1 K/uL   Immature Granulocytes 2 %   Abs Immature Granulocytes 0.37 (H) 0.00 - 0.07 K/uL    Comment: Performed at [facility], [address] [place], [place] [address]  Comprehensive metabolic panel     Status: Abnormal   Collection Time: 02/03/23 11:15 AM  Result Value Ref Range   Sodium 141 135 - 145 mmol/L   Potassium 4.4 3.5 - 5.1 mmol/L   Chloride 104 98 - 111 mmol/L   CO2 13 (L) 22 - 32 mmol/L   Glucose, Bld 82 70 - 99 mg/dL    Comment: Glucose reference range applies only to samples taken after fasting for at least 8 hours.   BUN 122 (H) 6 - 20 mg/dL   Creatinine, Ser 40.10 (H) 0.61 - 1.24 mg/dL   Calcium 9.6 8.9 - 27.2 mg/dL   Total Protein 6.9 6.5 - 8.1 g/dL   Albumin 2.7 (L) 3.5 - 5.0 g/dL   AST 12 (L) 15 - 41 U/L   ALT 10 0 - 44 U/L   Alkaline Phosphatase 65 38 -  126 U/L   Total Bilirubin 0.9 <1.2 mg/dL   GFR, Estimated 3 (L) >60 mL/min    Comment: (NOTE) Calculated using the CKD-EPI Creatinine Equation (2021)    Anion gap 24 (H) 5 - 15    Comment: ELECTROLYTES REPEATED TO  VERIFY Performed at [facility], [address], [place], [place] [address]   CBG monitoring, ED     Status: None   Collection Time: 02/03/23 11:17 AM  Result Value Ref Range   Glucose-Capillary 84 70 - 99 mg/dL    Comment: Glucose reference range applies only to samples taken after fasting for at least 8 hours.  Magnesium     Status: None   Collection Time: 02/03/23 11:20 AM  Result Value Ref Range   Magnesium 2.4 1.7 - 2.4 mg/dL    Comment: Performed at [facility], [address], [place], [place] [address]  I-Stat Lactic Acid     Status: None   Collection Time: 02/03/23 12:56 PM  Result Value Ref Range   Lactic Acid, Venous 1.1 0.5 - 1.9 mmol/L   DG Foot Complete Right Result Date: 02/03/2023 Please see detailed radiograph report in office note.  DG Foot Complete Right Result Date: 02/03/2023 CLINICAL DATA:  Right foot wound EXAM: RIGHT FOOT COMPLETE - 3 VIEW COMPARISON:  Earlier same day right foot radiographs, 10/06/2022 FINDINGS: Postsurgical changes from small toe amputation. Diffuse osteopenia. Extensive soft tissue vascular calcifications. Dressing material overlying the lateral forefoot, likely over the reported wound. No discrete soft tissue abnormality is seen. No definite cortical abnormality of the lateral forefoot. IMPRESSION: 1. Postsurgical changes from small toe amputation. No definite cortical abnormality of the lateral forefoot. 2. Diffuse osteopenia. Electronically Signed   By: Agustin Cree M.D.   On: 02/03/2023 12:16    Pending Labs Unresulted Labs (From admission, onward)     Start     Ordered   02/04/23 0500  CBC  Tomorrow morning,   R        02/03/23 1351   02/04/23 0500  Basic metabolic panel  Tomorrow morning,   R        02/03/23 1351   02/03/23 1222  Culture, blood (routine x 2)  BLOOD CULTURE X 2,   R (with STAT occurrences)      02/03/23 1221            Vitals/Pain Today's Vitals   02/03/23 1345 02/03/23 1400 02/03/23 1430  02/03/23 1453  BP: (!) 149/58 (!) 140/62 (!) 120/49   Pulse: 63 62 61   Resp: 13 10 19    Temp:    98.7 F (37.1 C)  TempSrc:    Oral  SpO2: 100% 99% 100%   Weight:      Height:      PainSc:        Isolation Precautions No active isolations  Medications Medications  vancomycin (VANCOCIN) IVPB 1000 mg/200 mL premix (0 mg Intravenous Stopped 02/03/23 1412)    Followed by  vancomycin (VANCOCIN) IVPB 1000 mg/200 mL premix (1,000 mg Intravenous New Bag/Given 02/03/23 1411)  amiodarone (PACERONE) tablet 200 mg (has no administration in time range)  amLODipine (NORVASC) tablet 5 mg (has no administration in time range)  amphetamine-dextroamphetamine (ADDERALL XR) 24 hr capsule 5 mg (has no administration in time range)  atorvastatin (LIPITOR) tablet 80 mg (has no administration in time range)  calcium acetate (PHOSLO) capsule 1,334 mg (has no administration in time range)  carvedilol (COREG) tablet 25 mg (  has no administration in time range)  dorzolamide-timolol (COSOPT) 2-0.5 % ophthalmic solution 1 drop (has no administration in time range)  levETIRAcetam (KEPPRA) tablet 250 mg (has no administration in time range)  levETIRAcetam (KEPPRA) tablet 500 mg (has no administration in time range)  sodium chloride flush (NS) 0.9 % injection 3 mL (3 mLs Intravenous Not Given 02/03/23 1405)  acetaminophen (TYLENOL) tablet 650 mg (has no administration in time range)    Or  acetaminophen (TYLENOL) suppository 650 mg (has no administration in time range)  ceFEPIme (MAXIPIME) 1 g in sodium chloride 0.9 % 100 mL IVPB (has no administration in time range)  metroNIDAZOLE (FLAGYL) IVPB 500 mg (0 mg Intravenous Stopped 02/03/23 1402)  ceFEPIme (MAXIPIME) 2 g in sodium chloride 0.9 % 100 mL IVPB (0 g Intravenous Stopped 02/03/23 1352)  sodium chloride 0.9 % bolus 500 mL (0 mLs Intravenous Stopped 02/03/23 1352)  HYDROmorphone (DILAUDID) injection 0.5 mg (0.5 mg Intravenous Given 02/03/23 1403)   ondansetron (ZOFRAN) injection 4 mg (4 mg Intravenous Given 02/03/23 1403)    Mobility walks with device     Focused Assessments    R Recommendations: See Admitting Provider Note  Report given to:   Additional Notes:

## 2023-02-03 NOTE — ED Notes (Signed)
Pt has large abscess on outer side of right foot. The abscess is approx the size of a golf ball and is bleeding. The abscess has a foul smell. Pt has 1+ swelling of right foot. Pt has 1+ right pedal pulse, pt able to wiggle toes. I applied a dry dressing to abscess.

## 2023-02-03 NOTE — Progress Notes (Signed)
Subjective:   Patient ID: [name] Barns., male   DOB: 52 y.o.   MRN: [id number]   HPI Chief Complaint  Patient presents with   Foot Pain    Patient states he has a sore under his right foot , no medication for pain, patient states he would like to tho. It has been a while like this patient states and he just wants the pain to go away. Patient right  foot is leaking and his sore is on the side of his right foot.    52 year old male presents after the above concerns.  He states that he has had a wound to the side of the foot for some time and has had increased pain to the area as well as drainage.  He does report some chills and decreased appetite over the last couple weeks.  He has not had any treatment for his foot.   Review of Systems  All other systems reviewed and are negative.  Past Medical History:  Diagnosis Date   Acute bilateral cerebral infarction in a watershed distribution [facility]) 04/21/2021   a.) MRI brain 04/21/2021 --> patchy ACUTE cortically-based infarcts within the bilateral high frontal lobes and right parietal lobe   Acute cerebral infarction (HCC) 02/13/2021   a.) MRI brain 02/13/2021: acute LEFT hippocampal infarct   Acute cerebral infarction (HCC) 04/21/2021   a.) MRI brain 04/21/2021: subcentimeter ACUTE infarct within the callosal splenium on the LEFT   Anemia of chronic renal failure    Anxiety    Aortic dilatation (HCC) 11/25/2020   a.) TTE 11/25/2020: Ao root measured 38 mm. b.) TTE 04/22/2021: Ao root measured 44 mm; c. 11/2021 Echo: Ao root 40mm.   Atrial fibrillation (HCC)    a.) CHA2DS2-VASc = 6 (CHF, HTN, CVA x2, prior MI, T2DM). b.) rate/rhythm maintained on oral amiodarone + carvedilol; chronic OAC/AP therapy discontinued following ICH   BPH (benign prostatic hyperplasia)    Cardiac arrest (HCC) 07/26/2021   a.) in setting of hyperkalemia, NSTEMI, and seizure following missed HD; pulseless and apneic --> required 1 round of CPR prior to ROSC    Cellulitis    Cerebral hemorrhage (HCC) 11/21/2021   a.) CT head 11/21/2021 --> 1.4 x 1.5 x 3.1 cm ACUTE hemorrhage within the inferior aspect of the fourth ventricle, and extending inferiorly through the foramen of foramen of [place] --> occurred in setting of HTN emergency and DOAC/APT-->eliquis/plavix d/c'd.   Cerebral microvascular disease    Chronic diarrhea    Chronic heart failure with preserved ejection fraction (HFpEF) (HCC) 07/19/2017   a.) 06/2017 Echo: EF 75%; b.) 11/2020 Echo: EF 55-60%; c.) 04/2021 Echo: EF 55-60%; d.) 11/2021 Echo: EF 65-70%, GrII DD, nl RV fxn, sev dil LA, large circumferential pericardial eff w/o tamponade, triv MR, AoV sclerosis. Ao root 40mm; e.) TTE 02/28/2025: EF 60-65%, sev LVH, mod posterior LV effusion, AoV sclerosis   Chronic pain of both ankles    Coronary artery disease 02/26/2020   a. 02/2020 Ant STEMI/PCI: LM nl, LAD 20m (3.5x30 Resolute Onyx), RI nl, LCX min irregs, RCA min irregs. EF 65%.   DDD (degenerative disc disease), cervical    Diabetes mellitus, type II (HCC)    Diabetic neuropathy (HCC)    Esophagitis    ESRD (end stage renal disease) on dialysis (HCC)    a.) [facility]; T-Th-Sat   Frequent falls    Gait instability    Gastritis    Gastroparesis    GERD (gastroesophageal reflux disease)  H/O enucleation of left eyeball    Heart palpitations    History of kidney stones    HLD (hyperlipidemia)    Hypertension    Insomnia    a.) uses melatonin PRN   Long term current use of amiodarone    Nausea and vomiting in adult    recurrent   NSTEMI (non-ST elevated myocardial infarction) (HCC) 07/26/2021   a.) hyperkalemic from missed HD; seizures; decompensated to cardiac arrest and required CPR x 1 round prior to ROSC.   Osteomyelitis (HCC)    Pericardial effusion    a. 11/2021 Echo: EF 65-70%, GrII DD, nl RV fxn, sev dil LA, large circumferential pericardial eff w/o tamponade, triv MR, AoV sclerosis. Ao root 40mm.   Seizure (HCC)    a.)  last 07/26/2021 in setting of missed HD --> hyperkalemic at 6.5 --> pulseless/apneic and required CPR; discharged home on levetiracetam.   ST elevation myocardial infarction (STEMI) of anterior wall (HCC) 02/26/2020   a.) LHC/PCI 02/26/2020 --> EF 65%; LVEDP 11 mmHg; 90% mLAD (3.5 x 20 mm Resolute Onyx DES x 1)    Past Surgical History:  Procedure Laterality Date   A/V FISTULAGRAM Left 08/31/2021   Procedure: A/V Fistulagram;  Surgeon: Annice Needy, MD;  Location: [name] INVASIVE CV LAB;  Service: Cardiovascular;  Laterality: Left;   A/V FISTULAGRAM Left 01/25/2022   Procedure: A/V Fistulagram;  Surgeon: Annice Needy, MD;  Location: [name] INVASIVE CV [facility];  Service: Cardiovascular;  Laterality: Left;   A/V FISTULAGRAM Right 10/18/2022   Procedure: A/V Fistulagram;  Surgeon: Annice Needy, MD;  Location: [name] IN[facility] CV [facility];  Service: Cardiovascular;  Laterality: Right;   AMPUTATION Left 03/16/2016   Procedure: AMPUTATION DIGIT LEFT HALLUX;  Surgeon: Vivi Barrack, DPM;  Location: [facility] OR;  Service: Podiatry;  Laterality: Left;  can start around 5    AMPUTATION Right 02/09/2021   Procedure: AMPUTATION DIGIT;  Surgeon: Marlyne Beards, MD;  Location: [facility] OR;  Service: Orthopedics;  Laterality: Right;   AMPUTATION TOE Right 02/28/2022   Procedure: AMPUTATION TOE;  Surgeon: Louann Sjogren, DPM;  Location: [name] ORS;  Service: Podiatry;  Laterality: Right;  right fifth toe   ARTHROSCOPIC REPAIR ACL Left    AV FISTULA PLACEMENT Right 05/31/2019   Procedure: Brachiocephalic AV fistula creation;  Surgeon: Annice Needy, MD;  Location: [name] ORS;  Service: Vascular;  Laterality: Right;   AV FISTULA PLACEMENT Left 08/13/2021   Procedure: ARTERIOVENOUS (AV) FISTULA CREATION (BRACHIALCEPHALIC);  Surgeon: Annice Needy, MD;  Location: [name] ORS;  Service: Vascular;  Laterality: Left;   COLONOSCOPY WITH PROPOFOL N/A 10/28/2015   Procedure: COLONOSCOPY WITH PROPOFOL;  Surgeon: Christena Deem, MD;  Location: [facility]  ENDOSCOPY;  Service: Endoscopy;  Laterality: N/A;   COLONOSCOPY WITH PROPOFOL N/A 10/29/2015   Procedure: COLONOSCOPY WITH PROPOFOL;  Surgeon: Christena Deem, MD;  Location: [facility] ENDOSCOPY;  Service: Endoscopy;  Laterality: N/A;   CORONARY ULTRASOUND/IVUS N/A 02/26/2020   Procedure: Intravascular Ultrasound/IVUS;  Surgeon: Corky Crafts, MD;  Location: [facility] INVASIVE CV LAB;  Service: Cardiovascular;  Laterality: N/A;   CORONARY/GRAFT ACUTE MI REVASCULARIZATION N/A 02/26/2020   Procedure: Coronary/Graft Acute MI Revascularization;  Surgeon: Corky Crafts, MD;  Location: [facility] INVASIVE CV LAB;  Service: Cardiovascular;  Laterality: N/A;   DIALYSIS/PERMA CATHETER INSERTION Right 04/26/2019   Perm Cath    DIALYSIS/PERMA CATHETER INSERTION N/A 04/26/2019   Procedure: DIALYSIS/PERMA CATHETER INSERTION;  Surgeon: Annice Needy, MD;  Location: [name] INV[facility] CV [facility];  Service: Cardiovascular;  Laterality: N/A;   DIALYSIS/PERMA CATHETER INSERTION N/A 05/25/2021   Procedure: DIALYSIS/PERMA CATHETER INSERTION;  Surgeon: Annice Needy, MD;  Location: [name] INV[facility] CV [facility];  Service: Cardiovascular;  Laterality: N/A;   DIALYSIS/PERMA CATHETER INSERTION N/A 08/31/2021   Procedure: DIALYSIS/PERMA CATHETER INSERTION;  Surgeon: Annice Needy, MD;  Location: [name];  Service: Cardiovascular;  Laterality: N/A;   DIALYSIS/PERMA CATHETER REMOVAL N/A 08/15/2019   Procedure: DIALYSIS/PERMA CATHETER REMOVAL;  Surgeon: Renford Dills, MD;  Location: [name] INV[facility];  Service: Cardiovascular;  Laterality: N/A;   DIALYSIS/PERMA CATHETER REMOVAL N/A 03/15/2022   Procedure: DIALYSIS/PERMA CATHETER REMOVAL;  Surgeon: Annice Needy, MD;  Location: [name];  Service: Cardiovascular;  Laterality: N/A;   EMBOLIZATION (CATH LAB) Right 02/06/2021   Procedure: EMBOLIZATION;  Surgeon: Annice Needy, MD;  Location: [name] INV[facility];  Service: Cardiovascular;  Laterality: Right;  Right Upper  Extremity Dialysis Access, Permcath Placement   ESOPHAGOGASTRODUODENOSCOPY (EGD) WITH PROPOFOL N/A 12/27/2017   Procedure: ESOPHAGOGASTRODUODENOSCOPY (EGD) WITH PROPOFOL;  Surgeon: Toledo, Boykin Nearing, MD;  Location: [name] ENDOSCOPY;  Service: Gastroenterology;  Laterality: N/A;   EYE SURGERY     LEFT HEART CATH AND CORONARY ANGIOGRAPHY N/A 02/26/2020   Procedure: LEFT HEART CATH AND CORONARY ANGIOGRAPHY;  Surgeon: Corky Crafts, MD;  Location: [facility] INVASIVE CV LAB;  Service: Cardiovascular;  Laterality: N/A;   PROSTATE SURGERY  2016   REVISON OF ARTERIOVENOUS FISTULA Right 07/22/2022   Procedure: RESECTION OF ANEURYSMAL RIGHT ARM ARTERIOVENOUS FISTULA;  Surgeon: Annice Needy, MD;  Location: [name] ORS;  Service: Vascular;  Laterality: Right;   TONSILECTOMY/ADENOIDECTOMY WITH MYRINGOTOMY     TONSILLECTOMY     UPPER EXTREMITY ANGIOGRAPHY Right 11/26/2020   Procedure: Upper Extremity Angiography;  Surgeon: Annice Needy, MD;  Location: [name] INV[facility] CV [facility];  Service: Cardiovascular;  Laterality: Right;   UPPER EXTREMITY ANGIOGRAPHY Right 02/05/2021   Procedure: UPPER EXTREMITY ANGIOGRAPHY;  Surgeon: Annice Needy, MD;  Location: [name] INV[facility] CV [facility];  Service: Cardiovascular;  Laterality: Right;    No current facility-administered medications for this visit.  Current Outpatient Medications:    acetaminophen (TYLENOL) 325 MG tablet, Take 1-2 tablets (325-650 mg total) by mouth every 4 (four) hours as needed for mild pain., Disp: , Rfl:    amiodarone (PACERONE) 200 MG tablet, Take 1 tablet (200 mg total) by mouth every morning., Disp: 90 tablet, Rfl: 2   ammonium lactate (AMLACTIN) 12 % cream, Apply 1 Application topically as needed for dry skin., Disp: 385 g, Rfl: 0   amphetamine-dextroamphetamine (ADDERALL XR) 5 MG 24 hr capsule, Take 5 mg by mouth daily., Disp: , Rfl:    atorvastatin (LIPITOR) 80 MG tablet, TAKE ONE TABLET BY MOUTH AT BEDTIME., Disp: 90 tablet, Rfl: 3   calcium acetate (PHOSLO)  667 MG capsule, Take 2 capsules (1,334 mg total) by mouth 3 (three) times daily with meals., Disp: 180 capsule, Rfl: 0   carvedilol (COREG) 25 MG tablet, Take 25 mg by mouth 2 (two) times daily., Disp: , Rfl:    fluticasone (FLONASE) 50 MCG/ACT nasal spray, Place 2 sprays into both nostrils daily., Disp: 16 g, Rfl: 0   hydrALAZINE (APRESOLINE) 25 MG tablet, Take 25 mg by mouth 3 (three) times daily. (Patient not taking: Reported on 02/03/2023), Disp: , Rfl:    hydrocortisone (ANUSOL-HC) 2.5 % rectal cream, Place 1 Application rectally 2 (two) times daily. (Patient not taking: Reported on 02/03/2023), Disp: 30 g,  Rfl: 0   levETIRAcetam (KEPPRA) 250 MG tablet, Take 1 tablet (250 mg total) by mouth Every Tuesday,Thursday,and Saturday with dialysis., Disp: 15 tablet, Rfl: 1   levETIRAcetam (KEPPRA) 500 MG tablet, Take 1 tablet (500 mg total) by mouth daily after breakfast., Disp: 30 tablet, Rfl: 1   lidocaine-prilocaine (EMLA) cream, Apply topically., Disp: , Rfl:    nitroGLYCERIN (NITROSTAT) 0.4 MG SL tablet, Place 1 tablet (0.4 mg total) under the tongue every 5 (five) minutes as needed for chest pain., Disp: 25 tablet, Rfl: 6   polyethylene glycol (MIRALAX / GLYCOLAX) 17 g packet, Take 17 g by mouth 2 (two) times daily. (Patient taking differently: Take 17 g by mouth 2 (two) times daily. New medication from Dr. Burgess Estelle, have to pick up at Pharmacy.), Disp: 180 packet, Rfl: 0   psyllium (METAMUCIL) 58.6 % packet, Take 1 packet by mouth 2 (two) times daily. (Patient taking differently: Take 1 packet by mouth 2 (two) times daily. New medication from Dr. Burgess Estelle, have to pick up at Pharmacy.), Disp: 60 packet, Rfl: 2   amLODipine (NORVASC) 5 MG tablet, Take 5 mg by mouth daily., Disp: , Rfl:    dorzolamide-timolol (COSOPT) 2-0.5 % ophthalmic solution, Place 1 drop into the right eye 2 (two) times daily., Disp: , Rfl:   Facility-Administered Medications Ordered in Other Visits:    acetaminophen  (TYLENOL) tablet 650 mg, 650 mg, Oral, Q6H PRN **OR** acetaminophen (TYLENOL) suppository 650 mg, 650 mg, Rectal, Q6H PRN, Hughie Closs, MD   [START ON 02/04/2023] amiodarone (PACERONE) tablet 200 mg, 200 mg, Oral, Daily, Pahwani, Ravi, MD   [START ON 02/04/2023] amLODipine (NORVASC) tablet 5 mg, 5 mg, Oral, Daily, Pahwani, Ravi, MD   amphetamine-dextroamphetamine (ADDERALL XR) 24 hr capsule 5 mg, 5 mg, Oral, Daily, Pahwani, Daleen Bo, MD   [START ON 02/04/2023] atorvastatin (LIPITOR) tablet 80 mg, 80 mg, Oral, QHS, Pahwani, Ravi, MD   calcium acetate (PHOSLO) capsule 1,334 mg, 1,334 mg, Oral, TID WC, Pahwani, Ravi, MD   carvedilol (COREG) tablet 25 mg, 25 mg, Oral, BID, Pahwani, Daleen Bo, MD   [START ON 02/04/2023] ceFEPIme (MAXIPIME) 1 g in sodium chloride 0.9 % 100 mL IVPB, 1 g, Intravenous, Q24H, Pahwani, Ravi, MD   dorzolamide-timolol (COSOPT) 2-0.5 % ophthalmic solution 1 drop, 1 drop, Right Eye, BID, Hughie Closs, MD   [START ON 02/05/2023] levETIRAcetam (KEPPRA) tablet 250 mg, 250 mg, Oral, Q T,Th,Sa-HD, Pahwani, Daleen Bo, MD   [START ON 02/04/2023] levETIRAcetam (KEPPRA) tablet 500 mg, 500 mg, Oral, QPC breakfast, Pahwani, Ravi, MD   sodium chloride flush (NS) 0.9 % injection 3 mL, 3 mL, Intravenous, Q12H, Pahwani, Ravi, MD  Allergies  Allergen Reactions   Promethazine Diarrhea and Other (See Comments)    Muscle cramps   Promethazine Hcl Other (See Comments)    Cramping all over  Other reaction(s): cramp all over   Other     Other reaction(s): Unknown Other reaction(s): Unknown           Objective:  Physical Exam  General: AAO x3, NAD  Dermatological: On the lateral aspect of the foot is a large abscess noted which is open with purulent drainage.  There is increased temperature, cellulitis noted to the foot.  Vascular: Dorsalis Pedis artery and Posterior Tibial artery pedal pulses are decreased on right given swelling.  Neruologic: Decreased  Musculoskeletal: Diffuse pain to  the right foot     Assessment:   Abscess, cellulitis right foot     Plan:  -Treatment options discussed  including all alternatives, risks, and complications -Etiology of symptoms were discussed -X-rays were obtained and reviewed with the patient.  3 views right foot present.  Increased soft tissue density noted.  No definitive cortical changes suggest osteomyelitis -Given the extent of the infection I advised the patient to go directly to the emergency room.  His wife to drive him today and he is to go immediately to [facility].  We contacted the ER to let us know of his arrival.  I have also contacted Dr. Annamary Rummage who is coving for our office to let him know of his arrival. He will a MRI to further evaluate and anticipate surgical intervention.  Start broad-spectrum antibiotics. Would also order ABI

## 2023-02-03 NOTE — ED Notes (Addendum)
Pt to MRI then IP floor. Pt well appearing upon transport.

## 2023-02-04 ENCOUNTER — Inpatient Hospital Stay (HOSPITAL_COMMUNITY): Payer: Medicare HMO

## 2023-02-04 ENCOUNTER — Other Ambulatory Visit: Payer: Self-pay

## 2023-02-04 ENCOUNTER — Encounter (HOSPITAL_COMMUNITY): Payer: Self-pay | Admitting: Gastroenterology

## 2023-02-04 ENCOUNTER — Inpatient Hospital Stay (HOSPITAL_COMMUNITY): Payer: Medicare HMO | Admitting: Anesthesiology

## 2023-02-04 ENCOUNTER — Encounter (HOSPITAL_COMMUNITY)
Admission: EM | Disposition: A | Discharge status: Skilled Nursing Facility | Payer: Self-pay | Source: Ambulatory Visit | Attending: Family Medicine

## 2023-02-04 DIAGNOSIS — M869 Osteomyelitis, unspecified: Secondary | ICD-10-CM | POA: Insufficient documentation

## 2023-02-04 DIAGNOSIS — E11621 Type 2 diabetes mellitus with foot ulcer: Secondary | ICD-10-CM | POA: Diagnosis not present

## 2023-02-04 DIAGNOSIS — L089 Local infection of the skin and subcutaneous tissue, unspecified: Principal | ICD-10-CM

## 2023-02-04 DIAGNOSIS — L97413 Non-pressure chronic ulcer of right heel and midfoot with necrosis of muscle: Secondary | ICD-10-CM | POA: Diagnosis not present

## 2023-02-04 DIAGNOSIS — E11628 Type 2 diabetes mellitus with other skin complications: Secondary | ICD-10-CM | POA: Diagnosis not present

## 2023-02-04 DIAGNOSIS — E1169 Type 2 diabetes mellitus with other specified complication: Secondary | ICD-10-CM | POA: Diagnosis not present

## 2023-02-04 DIAGNOSIS — M86171 Other acute osteomyelitis, right ankle and foot: Secondary | ICD-10-CM

## 2023-02-04 HISTORY — PX: AMPUTATION: SHX166

## 2023-02-04 LAB — BASIC METABOLIC PANEL
Anion gap: 19 — ABNORMAL HIGH (ref 5–15)
BUN: 132 mg/dL — ABNORMAL HIGH (ref 6–20)
CO2: 15 mmol/L — ABNORMAL LOW (ref 22–32)
Calcium: 8.8 mg/dL — ABNORMAL LOW (ref 8.9–10.3)
Chloride: 104 mmol/L (ref 98–111)
Creatinine, Ser: 18.7 mg/dL — ABNORMAL HIGH (ref 0.61–1.24)
GFR, Estimated: 3 mL/min — ABNORMAL LOW (ref >60)
Glucose, Bld: 99 mg/dL (ref 70–99)
Potassium: 4.6 mmol/L (ref 3.5–5.1)
Sodium: 138 mmol/L (ref 135–145)

## 2023-02-04 LAB — CBC
HCT: 26.4 % — ABNORMAL LOW (ref 39.0–52.0)
Hemoglobin: 8.5 g/dL — ABNORMAL LOW (ref 13.0–17.0)
MCH: 22.5 pg — ABNORMAL LOW (ref 26.0–34.0)
MCHC: 32.2 g/dL (ref 30.0–36.0)
MCV: 69.8 fL — ABNORMAL LOW (ref 80.0–100.0)
Platelets: 206 10*3/uL (ref 150–400)
RBC: 3.78 MIL/uL — ABNORMAL LOW (ref 4.22–5.81)
RDW: 20.9 % — ABNORMAL HIGH (ref 11.5–15.5)
WBC: 22.5 10*3/uL — ABNORMAL HIGH (ref 4.0–10.5)
nRBC: 0 % (ref 0.0–0.2)

## 2023-02-04 LAB — HEPATITIS B SURFACE ANTIGEN: Hepatitis B Surface Ag: NONREACTIVE

## 2023-02-04 LAB — GLUCOSE, CAPILLARY
Glucose-Capillary: 97 mg/dL (ref 70–99)
Glucose-Capillary: 86 mg/dL (ref 70–99)
Glucose-Capillary: 97 mg/dL (ref 70–99)

## 2023-02-04 SURGERY — AMPUTATION, FOOT, RAY
Anesthesia: Regional | Laterality: Right

## 2023-02-04 MED ORDER — CARVEDILOL 12.5 MG PO TABS
12.5000 mg | ORAL_TABLET | Freq: Two times a day (BID) | ORAL | Status: DC
  Administered 2023-02-05 – 2023-02-18 (×19): 12.5 mg via ORAL
  Filled 2023-02-04 (×26): qty 1

## 2023-02-04 MED ORDER — GENTAMICIN SULFATE 40 MG/ML IJ SOLN
INTRAMUSCULAR | Status: AC
  Filled 2023-02-04: qty 2

## 2023-02-04 MED ORDER — MIDAZOLAM HCL 2 MG/2ML IJ SOLN
INTRAMUSCULAR | Status: AC
  Filled 2023-02-04: qty 2

## 2023-02-04 MED ORDER — VANCOMYCIN HCL 750 MG/150ML IV SOLN
750.0000 mg | Freq: Once | INTRAVENOUS | Status: AC
  Administered 2023-02-05: 750 mg via INTRAVENOUS
  Filled 2023-02-04: qty 150

## 2023-02-04 MED ORDER — ALBUMIN HUMAN 5 % IV SOLN: 12.5000 g | Freq: Once | INTRAVENOUS | Status: AC

## 2023-02-04 MED ORDER — VANCOMYCIN HCL 500 MG IV SOLR
INTRAVENOUS | Status: AC
  Filled 2023-02-04: qty 10

## 2023-02-04 MED ORDER — PHENYLEPHRINE 80 MCG/ML (10ML) SYRINGE FOR IV PUSH (FOR BLOOD PRESSURE SUPPORT)
PREFILLED_SYRINGE | INTRAVENOUS | Status: AC
  Filled 2023-02-04: qty 10

## 2023-02-04 MED ORDER — SODIUM CHLORIDE (PF) 0.9 % IJ SOLN
INTRAMUSCULAR | Status: AC
  Filled 2023-02-04: qty 10

## 2023-02-04 MED ORDER — ROPIVACAINE HCL 5 MG/ML IJ SOLN
INTRAMUSCULAR | Status: DC | PRN
  Administered 2023-02-04: 30 mL via PERINEURAL
  Administered 2023-02-04: 10 mL via PERINEURAL

## 2023-02-04 MED ORDER — FENTANYL CITRATE (PF) 100 MCG/2ML IJ SOLN
INTRAMUSCULAR | Status: AC
  Administered 2023-02-04: 50 ug via INTRAVENOUS
  Filled 2023-02-04: qty 2

## 2023-02-04 MED ORDER — PHENYLEPHRINE 80 MCG/ML (10ML) SYRINGE FOR IV PUSH (FOR BLOOD PRESSURE SUPPORT)
PREFILLED_SYRINGE | INTRAVENOUS | Status: DC | PRN
  Administered 2023-02-04: 80 ug via INTRAVENOUS

## 2023-02-04 MED ORDER — BUPIVACAINE HCL (PF) 0.5 % IJ SOLN
INTRAMUSCULAR | Status: AC
  Filled 2023-02-04: qty 30

## 2023-02-04 MED ORDER — PROPOFOL 10 MG/ML IV BOLUS
INTRAVENOUS | Status: DC | PRN
  Administered 2023-02-04: 50 mg via INTRAVENOUS

## 2023-02-04 MED ORDER — LIDOCAINE 2% (20 MG/ML) 5 ML SYRINGE
INTRAMUSCULAR | Status: AC
  Filled 2023-02-04: qty 5

## 2023-02-04 MED ORDER — ALBUMIN HUMAN 5 % IV SOLN
INTRAVENOUS | Status: AC
  Administered 2023-02-04: 12.5 g
  Filled 2023-02-04: qty 250

## 2023-02-04 MED ORDER — VANCOMYCIN HCL 750 MG/150ML IV SOLN
750.0000 mg | INTRAVENOUS | Status: DC
Start: 2023-02-08 — End: 2023-02-07

## 2023-02-04 MED ORDER — FENTANYL CITRATE (PF) 100 MCG/2ML IJ SOLN
25.0000 ug | INTRAMUSCULAR | Status: DC | PRN
Start: 2023-02-04 — End: 2023-02-04

## 2023-02-04 MED ORDER — VANCOMYCIN HCL 1000 MG IV SOLR
INTRAVENOUS | Status: AC
  Filled 2023-02-04: qty 20

## 2023-02-04 MED ORDER — CHLORHEXIDINE GLUCONATE 0.12 % MT SOLN
OROMUCOSAL | Status: AC
  Administered 2023-02-04: 15 mL via OROMUCOSAL
  Filled 2023-02-04: qty 15

## 2023-02-04 MED ORDER — PROPOFOL 10 MG/ML IV BOLUS
INTRAVENOUS | Status: AC
  Filled 2023-02-04: qty 20

## 2023-02-04 MED ORDER — ORAL CARE MOUTH RINSE: 15.0000 mL | Freq: Once | OROMUCOSAL | Status: AC

## 2023-02-04 MED ORDER — PROPOFOL 500 MG/50ML IV EMUL
INTRAVENOUS | Status: DC | PRN
  Administered 2023-02-04: 200 ug/kg/min via INTRAVENOUS

## 2023-02-04 MED ORDER — TOBRAMYCIN SULFATE 80 MG/2ML IJ SOLN
INTRAMUSCULAR | Status: AC
  Filled 2023-02-04: qty 4

## 2023-02-04 MED ORDER — CHLORHEXIDINE GLUCONATE CLOTH 2 % EX PADS
6.0000 | MEDICATED_PAD | Freq: Every day | CUTANEOUS | Status: DC
  Administered 2023-02-05 – 2023-02-10 (×4): 6 via TOPICAL

## 2023-02-04 MED ORDER — FENTANYL CITRATE (PF) 100 MCG/2ML IJ SOLN: 50.0000 ug | Freq: Once | INTRAMUSCULAR | Status: AC

## 2023-02-04 MED ORDER — SODIUM CHLORIDE 0.9 % IV SOLN
INTRAVENOUS | Status: DC
Start: 2023-02-04 — End: 2023-02-04

## 2023-02-04 MED ORDER — CHLORHEXIDINE GLUCONATE 0.12 % MT SOLN: 15.0000 mL | Freq: Once | OROMUCOSAL | Status: AC

## 2023-02-04 SURGICAL SUPPLY — 35 items
BEADS BIO ARTH CALC SULFAT 5CC (Bone Implant) IMPLANT
BIOBEADS ARTH CALC SULFATE 5CC (Bone Implant) ×1 IMPLANT
BLADE AVERAGE 25X9 (BLADE) IMPLANT
BLADE OSC/SAGITTAL MD 5.5X18 (BLADE) ×1 IMPLANT
BLADE SURG 10 STRL SS (BLADE) ×1 IMPLANT
BLADE SURG 15 STRL LF DISP TIS (BLADE) ×2 IMPLANT
BNDG COHESIVE 3X5 TAN ST LF (GAUZE/BANDAGES/DRESSINGS) ×1 IMPLANT
BNDG ELASTIC 3INX 5YD STR LF (GAUZE/BANDAGES/DRESSINGS) ×1 IMPLANT
BNDG ELASTIC 4INX 5YD STR LF (GAUZE/BANDAGES/DRESSINGS) IMPLANT
BNDG ESMARK 4X9 LF (GAUZE/BANDAGES/DRESSINGS) ×1 IMPLANT
CHLORAPREP W/TINT 26 (MISCELLANEOUS) ×1 IMPLANT
ELECT REM PT RETURN 9FT ADLT (ELECTROSURGICAL) ×1
ELECTRODE REM PT RTRN 9FT ADLT (ELECTROSURGICAL) ×1 IMPLANT
GAUZE SPONGE 2X2 STRL 8-PLY (GAUZE/BANDAGES/DRESSINGS) ×2 IMPLANT
GAUZE SPONGE 4X4 12PLY STRL (GAUZE/BANDAGES/DRESSINGS) ×1 IMPLANT
GAUZE STRETCH 2X75IN STRL (MISCELLANEOUS) ×1 IMPLANT
GAUZE XEROFORM 1X8 LF (GAUZE/BANDAGES/DRESSINGS) ×1 IMPLANT
GLOVE BIO SURGEON STRL SZ7.5 (GLOVE) ×1 IMPLANT
GLOVE BIOGEL PI IND STRL 7.5 (GLOVE) ×1 IMPLANT
GOWN STRL REUS W/ TWL LRG LVL3 (GOWN DISPOSABLE) ×2 IMPLANT
KIT BASIN OR (CUSTOM PROCEDURE TRAY) ×1 IMPLANT
NDL BIOPSY JAMSHIDI 8X6 (NEEDLE) IMPLANT
NDL HYPO 25X1 1.5 SAFETY (NEEDLE) ×2 IMPLANT
NEEDLE BIOPSY JAMSHIDI 8X6 (NEEDLE) IMPLANT
NEEDLE HYPO 25X1 1.5 SAFETY (NEEDLE) ×2 IMPLANT
PACK ORTHO EXTREMITY (CUSTOM PROCEDURE TRAY) ×1 IMPLANT
PADDING CAST ABS COTTON 4X4 ST (CAST SUPPLIES) ×2 IMPLANT
SPIKE FLUID TRANSFER (MISCELLANEOUS) ×2 IMPLANT
STAPLER VISISTAT 35W (STAPLE) IMPLANT
STOCKINETTE 4X48 STRL (DRAPES) ×1 IMPLANT
SUT ETHILON 3 0 FSLX (SUTURE) ×1 IMPLANT
SUT PROLENE 4 0 PS 2 18 (SUTURE) ×2 IMPLANT
SYR CONTROL 10ML LL (SYRINGE) ×2 IMPLANT
UNDERPAD 30X36 HEAVY ABSORB (UNDERPADS AND DIAPERS) ×1 IMPLANT
WATER STERILE IRR 1000ML POUR (IV SOLUTION) ×1 IMPLANT

## 2023-02-04 NOTE — Progress Notes (Signed)
[facility] Pre-op Review  Pre-op checklist: To be completed by bedside RN  NPO: Ordered  Labs: Creatinine 18.70, Hgb 8.5  Consent: Nor orders  H&P: Hospitalist  Vitals: pulse is low    O2 requirements: RA    MAR/PTA review: Coreg due @ 10  IV: 20g x 2  Floor nurse name:    Additional info:   Pt diabetic but is no longer on medications. Per H&P last A1c was 4.2 On tele for paroxysmal afib

## 2023-02-04 NOTE — Consult Note (Signed)
Renal Service Consult Note [facility]  [place]. 02/04/2023 Maree Krabbe, MD Requesting Physician: Dr. Benjamine Mola  Reason for Consult: ESRD pt w/  HPI: The patient is a 52 y.o. year-old w/ PMH as below who presented w/ infected foot wound w/ drainage and pain. Podiatry consulted, IV abx started and pt admitted. We are asked to see for dialysis.   Pt on HD at [facility] in [place] TTS schedule. Reports recent diarrhea, no abd pain, no SOB.   ROS - denies CP, no joint pain, no HA, no blurry vision, no rash, no diarrhea, no nausea/ vomiting  Past Medical History  Past Medical History:  Diagnosis Date   Acute bilateral cerebral infarction in a watershed distribution [facility]) 04/21/2021   a.) MRI brain 04/21/2021 --> patchy ACUTE cortically-based infarcts within the bilateral high frontal lobes and right parietal lobe   Acute cerebral infarction (HCC) 02/13/2021   a.) MRI brain 02/13/2021: acute LEFT hippocampal infarct   Acute cerebral infarction (HCC) 04/21/2021   a.) MRI brain 04/21/2021: subcentimeter ACUTE infarct within the callosal splenium on the LEFT   Anemia of chronic renal failure    Anxiety    Aortic dilatation (HCC) 11/25/2020   a.) TTE 11/25/2020: Ao root measured 38 mm. b.) TTE 04/22/2021: Ao root measured 44 mm; c. 11/2021 Echo: Ao root 40mm.   Atrial fibrillation (HCC)    a.) CHA2DS2-VASc = 6 (CHF, HTN, CVA x2, prior MI, T2DM). b.) rate/rhythm maintained on oral amiodarone + carvedilol; chronic OAC/AP therapy discontinued following ICH   BPH (benign prostatic hyperplasia)    Cardiac arrest (HCC) 07/26/2021   a.) in setting of hyperkalemia, NSTEMI, and seizure following missed HD; pulseless and apneic --> required 1 round of CPR prior to ROSC   Cellulitis    Cerebral hemorrhage (HCC) 11/21/2021   a.) CT head 11/21/2021 --> 1.4 x 1.5 x 3.1 cm ACUTE hemorrhage within the inferior aspect of the fourth ventricle, and extending inferiorly through the  foramen of foramen of [place] --> occurred in setting of HTN emergency and DOAC/APT-->eliquis/plavix d/c'd.   Cerebral microvascular disease    Chronic diarrhea    Chronic heart failure with preserved ejection fraction (HFpEF) (HCC) 07/19/2017   a.) 06/2017 Echo: EF 75%; b.) 11/2020 Echo: EF 55-60%; c.) 04/2021 Echo: EF 55-60%; d.) 11/2021 Echo: EF 65-70%, GrII DD, nl RV fxn, sev dil LA, large circumferential pericardial eff w/o tamponade, triv MR, AoV sclerosis. Ao root 40mm; e.) TTE 02/28/2025: EF 60-65%, sev LVH, mod posterior LV effusion, AoV sclerosis   Chronic pain of both ankles    Coronary artery disease 02/26/2020   a. 02/2020 Ant STEMI/PCI: LM nl, LAD 61m (3.5x30 Resolute Onyx), RI nl, LCX min irregs, RCA min irregs. EF 65%.   DDD (degenerative disc disease), cervical    Diabetes mellitus, type II (HCC)    Diabetic neuropathy (HCC)    Esophagitis    ESRD (end stage renal disease) on dialysis (HCC)    a.) [facility]; T-Th-Sat   Frequent falls    Gait instability    Gastritis    Gastroparesis    GERD (gastroesophageal reflux disease)    H/O enucleation of left eyeball    Heart palpitations    History of kidney stones    HLD (hyperlipidemia)    Hypertension    Insomnia    a.) uses melatonin PRN   Long term current use of amiodarone    Nausea and vomiting in adult    recurrent  NSTEMI (non-ST elevated myocardial infarction) (HCC) 07/26/2021   a.) hyperkalemic from missed HD; seizures; decompensated to cardiac arrest and required CPR x 1 round prior to ROSC.   Osteomyelitis (HCC)    Pericardial effusion    a. 11/2021 Echo: EF 65-70%, GrII DD, nl RV fxn, sev dil LA, large circumferential pericardial eff w/o tamponade, triv MR, AoV sclerosis. Ao root 40mm.   Seizure (HCC)    a.) last 07/26/2021 in setting of missed HD --> hyperkalemic at 6.5 --> pulseless/apneic and required CPR; discharged home on levetiracetam.   ST elevation myocardial infarction (STEMI) of anterior wall (HCC)  02/26/2020   a.) LHC/PCI 02/26/2020 --> EF 65%; LVEDP 11 mmHg; 90% mLAD (3.5 x 20 mm Resolute Onyx DES x 1)   Past Surgical History  Past Surgical History:  Procedure Laterality Date   A/V FISTULAGRAM Left 08/31/2021   Procedure: A/V Fistulagram;  Surgeon: Annice Needy, MD;  Location: [name] INVASIVE CV LAB;  Service: Cardiovascular;  Laterality: Left;   A/V FISTULAGRAM Left 01/25/2022   Procedure: A/V Fistulagram;  Surgeon: Annice Needy, MD;  Location: [name] INVASIVE CV LAB;  Service: Cardiovascular;  Laterality: Left;   A/V FISTULAGRAM Right 10/18/2022   Procedure: A/V Fistulagram;  Surgeon: Annice Needy, MD;  Location: [name] INVASIVE CV LAB;  Service: Cardiovascular;  Laterality: Right;   AMPUTATION Left 03/16/2016   Procedure: AMPUTATION DIGIT LEFT HALLUX;  Surgeon: Vivi Barrack, DPM;  Location: [facility] OR;  Service: Podiatry;  Laterality: Left;  can start around 5    AMPUTATION Right 02/09/2021   Procedure: AMPUTATION DIGIT;  Surgeon: Marlyne Beards, MD;  Location: [facility] OR;  Service: Orthopedics;  Laterality: Right;   AMPUTATION TOE Right 02/28/2022   Procedure: AMPUTATION TOE;  Surgeon: Louann Sjogren, DPM;  Location: [name] ORS;  Service: Podiatry;  Laterality: Right;  right fifth toe   ARTHROSCOPIC REPAIR ACL Left    AV FISTULA PLACEMENT Right 05/31/2019   Procedure: Brachiocephalic AV fistula creation;  Surgeon: Annice Needy, MD;  Location: [name] ORS;  Service: Vascular;  Laterality: Right;   AV FISTULA PLACEMENT Left 08/13/2021   Procedure: ARTERIOVENOUS (AV) FISTULA CREATION (BRACHIALCEPHALIC);  Surgeon: Annice Needy, MD;  Location: [name] ORS;  Service: Vascular;  Laterality: Left;   COLONOSCOPY WITH PROPOFOL N/A 10/28/2015   Procedure: COLONOSCOPY WITH PROPOFOL;  Surgeon: Christena Deem, MD;  Location: [facility] ENDOSCOPY;  Service: Endoscopy;  Laterality: N/A;   COLONOSCOPY WITH PROPOFOL N/A 10/29/2015   Procedure: COLONOSCOPY WITH PROPOFOL;  Surgeon: Christena Deem, MD;  Location: [facility]  ENDOSCOPY;  Service: Endoscopy;  Laterality: N/A;   COLONOSCOPY WITH PROPOFOL N/A 01/27/2023   Procedure: COLONOSCOPY WITH PROPOFOL;  Surgeon: Franky Macho, MD;  Location: [facility] ENDO SUITE;  Service: Endoscopy;  Laterality: N/A;  12:30pm, asa 3   CORONARY ULTRASOUND/IVUS N/A 02/26/2020   Procedure: Intravascular Ultrasound/IVUS;  Surgeon: Corky Crafts, MD;  Location: [facility] INVASIVE CV LAB;  Service: Cardiovascular;  Laterality: N/A;   CORONARY/GRAFT ACUTE MI REVASCULARIZATION N/A 02/26/2020   Procedure: Coronary/Graft Acute MI Revascularization;  Surgeon: Corky Crafts, MD;  Location: [facility] INVASIVE CV LAB;  Service: Cardiovascular;  Laterality: N/A;   DIALYSIS/PERMA CATHETER INSERTION Right 04/26/2019   Perm Cath    DIALYSIS/PERMA CATHETER INSERTION N/A 04/26/2019   Procedure: DIALYSIS/PERMA CATHETER INSERTION;  Surgeon: Annice Needy, MD;  Location: [name] INVASIVE CV LAB;  Service: Cardiovascular;  Laterality: N/A;   DIALYSIS/PERMA CATHETER INSERTION N/A 05/25/2021   Procedure: DIALYSIS/PERMA CATHETER INSERTION;  Surgeon: Wyn Quaker,  Marlow Baars, MD;  Location: [name] INVASIVE CV LAB;  Service: Cardiovascular;  Laterality: N/A;   DIALYSIS/PERMA CATHETER INSERTION N/A 08/31/2021   Procedure: DIALYSIS/PERMA CATHETER INSERTION;  Surgeon: Annice Needy, MD;  Location: [name] INVASIVE CV LAB;  Service: Cardiovascular;  Laterality: N/A;   DIALYSIS/PERMA CATHETER REMOVAL N/A 08/15/2019   Procedure: DIALYSIS/PERMA CATHETER REMOVAL;  Surgeon: Renford Dills, MD;  Location: [name] INVASIVE CV LAB;  Service: Cardiovascular;  Laterality: N/A;   DIALYSIS/PERMA CATHETER REMOVAL N/A 03/15/2022   Procedure: DIALYSIS/PERMA CATHETER REMOVAL;  Surgeon: Annice Needy, MD;  Location: [name] INVASIVE CV LAB;  Service: Cardiovascular;  Laterality: N/A;   EMBOLIZATION (CATH LAB) Right 02/06/2021   Procedure: EMBOLIZATION;  Surgeon: Annice Needy, MD;  Location: [name] INVASIVE CV LAB;  Service: Cardiovascular;  Laterality: Right;   Right Upper Extremity Dialysis Access, Permcath Placement   ESOPHAGOGASTRODUODENOSCOPY (EGD) WITH PROPOFOL N/A 12/27/2017   Procedure: ESOPHAGOGASTRODUODENOSCOPY (EGD) WITH PROPOFOL;  Surgeon: Toledo, Boykin Nearing, MD;  Location: [name] ENDOSCOPY;  Service: Gastroenterology;  Laterality: N/A;   EYE SURGERY     LEFT HEART CATH AND CORONARY ANGIOGRAPHY N/A 02/26/2020   Procedure: LEFT HEART CATH AND CORONARY ANGIOGRAPHY;  Surgeon: Corky Crafts, MD;  Location: [facility] INVASIVE CV LAB;  Service: Cardiovascular;  Laterality: N/A;   PROSTATE SURGERY  2016   REVISON OF ARTERIOVENOUS FISTULA Right 07/22/2022   Procedure: RESECTION OF ANEURYSMAL RIGHT ARM ARTERIOVENOUS FISTULA;  Surgeon: Annice Needy, MD;  Location: [name] ORS;  Service: Vascular;  Laterality: Right;   TONSILECTOMY/ADENOIDECTOMY WITH MYRINGOTOMY     TONSILLECTOMY     UPPER EXTREMITY ANGIOGRAPHY Right 11/26/2020   Procedure: Upper Extremity Angiography;  Surgeon: Annice Needy, MD;  Location: [name] INVASIVE CV LAB;  Service: Cardiovascular;  Laterality: Right;   UPPER EXTREMITY ANGIOGRAPHY Right 02/05/2021   Procedure: UPPER EXTREMITY ANGIOGRAPHY;  Surgeon: Annice Needy, MD;  Location: [name] INVASIVE CV LAB;  Service: Cardiovascular;  Laterality: Right;   Family History  Family History  Problem Relation Age of Onset   CAD Father    Stroke Father    Diabetes Mellitus II Mother    Kidney failure Mother    Schizophrenia Mother    Social History  reports that he has never smoked. He has never used smokeless tobacco. He reports that he does not drink alcohol and does not use drugs. Allergies  Allergies  Allergen Reactions   Promethazine Diarrhea and Other (See Comments)    Muscle cramps   Promethazine Hcl Other (See Comments)    Cramping all over  Other reaction(s): cramp all over   Other     Other reaction(s): Unknown Other reaction(s): Unknown   Home medications Prior to Admission medications   Medication Sig Start Date End Date  Taking? Authorizing Provider  acetaminophen (TYLENOL) 325 MG tablet Take 1-2 tablets (325-650 mg total) by mouth every 4 (four) hours as needed for mild pain. 05/04/21  Yes Love, Evlyn Kanner, PA-C  amiodarone (PACERONE) 200 MG tablet Take 1 tablet (200 mg total) by mouth every morning. 08/02/22 08/03/23 Yes Corky Crafts, MD  amLODipine (NORVASC) 5 MG tablet Take 5 mg by mouth daily.   Yes [provider]  ammonium lactate (AMLACTIN) 12 % cream Apply 1 Application topically as needed for dry skin. 05/25/22  Yes Vivi Barrack, DPM  atorvastatin (LIPITOR) 80 MG tablet TAKE ONE TABLET BY MOUTH AT BEDTIME. 05/07/22  Yes Corky Crafts, MD  calcium acetate (PHOSLO) 667 MG capsule Take 2 capsules (  1,334 mg total) by mouth 3 (three) times daily with meals. 05/04/21  Yes Love, Evlyn Kanner, PA-C  carvedilol (COREG) 25 MG tablet Take 25 mg by mouth 2 (two) times daily. 06/16/21  Yes [provider]  dorzolamide-timolol (COSOPT) 2-0.5 % ophthalmic solution Place 1 drop into the right eye 2 (two) times daily. 11/15/22 11/15/23 Yes [provider]  levETIRAcetam (KEPPRA) 250 MG tablet Take 1 tablet (250 mg total) by mouth Every Tuesday,Thursday,and Saturday with dialysis. 04/08/22  Yes Johnson, Clanford L, MD  levETIRAcetam (KEPPRA) 500 MG tablet Take 1 tablet (500 mg total) by mouth daily after breakfast. 04/08/22  Yes Johnson, Clanford L, MD  lidocaine-prilocaine (EMLA) cream Apply topically. 12/24/21  Yes [provider]  amphetamine-dextroamphetamine (ADDERALL XR) 5 MG 24 hr capsule Take 5 mg by mouth daily.    [provider]  fluticasone (FLONASE) 50 MCG/ACT nasal spray Place 2 sprays into both nostrils daily. 10/05/22   Leath-Warren, Sadie Haber, NP  hydrALAZINE (APRESOLINE) 25 MG tablet Take 25 mg by mouth 3 (three) times daily. Patient not taking: Reported on 02/03/2023 03/30/22   [provider]  hydrocortisone (ANUSOL-HC) 2.5 % rectal cream Place 1  Application rectally 2 (two) times daily. Patient not taking: Reported on 02/03/2023 10/05/22   Leath-Warren, Sadie Haber, NP  nitroGLYCERIN (NITROSTAT) 0.4 MG SL tablet Place 1 tablet (0.4 mg total) under the tongue every 5 (five) minutes as needed for chest pain. 03/24/22   Corky Crafts, MD  polyethylene glycol (MIRALAX / GLYCOLAX) 17 g packet Take 17 g by mouth 2 (two) times daily. Patient taking differently: Take 17 g by mouth 2 (two) times daily. New medication from Dr. Burgess Estelle, have to pick up at Pharmacy. 01/27/23 04/27/23  Franky Macho, MD  psyllium (METAMUCIL) 58.6 % packet Take 1 packet by mouth 2 (two) times daily. Patient taking differently: Take 1 packet by mouth 2 (two) times daily. New medication from Dr. Burgess Estelle, have to pick up at Pharmacy. 01/27/23 04/27/23  Franky Macho, MD     Vitals:   02/04/23 1315 02/04/23 1330 02/04/23 1345 02/04/23 1704  BP: 126/64 121/62 121/64 (!) 113/59  Pulse: (!) 57 (!) 56 (!) 56 (!) 57  Resp: 10 10 12 18   Temp:   97.8 F (36.6 C) 98.7 F (37.1 C)  TempSrc:   Oral Oral  SpO2: 100% 96% 97% 99%  Weight:      Height:       Exam Gen alert, no distress, chronically ill appearing No rash, cyanosis or gangrene Sclera anicteric, throat clear  No jvd or bruits Chest L clear, R dec'd at base, no rales/ wheezing RRR no RG Abd soft ntnd no mass or ascites +bs GU nl male MS no joint effusions or deformity Ext no LE or UE edema, no wounds or ulcers Neuro is alert, Ox 3 , nf     LUA AVF+bruit     Renal-related home meds: - norvasc, coreg, hydralazine - phoslo 2 ac tid    OP HD: [facility] TTS  3.5h  350/ 600   ? Dry wt   2/2.5 from feb 2024, needs updating     Na 138  BUN 132  creat 18  K+ 4.6    Assessment/ Plan: Diabetic foot infection - on IV abx, poss osteo per MRI. Per pmd/ podiatry.  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 Has missed dialysis w/ high Bun/Cr. Will try to fit him in for HD tonight.  HTN - bp's  good, cont home  meds Volume - get dry wt from OP unit Anemia of eskd - Hb 8- 9 here. Follow.  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 Hold any vdra. Add phos, cont binders w/ meals.      Vinson Moselle  MD [facility] 02/04/2023, 5:21 PM  Recent Labs  Lab 02/03/23 1115 02/04/23 0331  HGB 8.9* 8.5*  ALBUMIN 2.7*  --   CALCIUM 9.6 8.8*  CREATININE 18.57* 18.70*  K 4.4 4.6   Inpatient medications:  amiodarone  200 mg Oral Daily   amLODipine  5 mg Oral Daily   amphetamine-dextroamphetamine  5 mg Oral Daily   atorvastatin  80 mg Oral QHS   calcium acetate  1,334 mg Oral TID WC   carvedilol  12.5 mg Oral BID   dorzolamide-timolol  1 drop Right Eye BID   [START ON 02/05/2023] levETIRAcetam  250 mg Oral Q T,Th,Sa-HD   levETIRAcetam  500 mg Oral QPC breakfast   sodium chloride flush  3 mL Intravenous Q12H    ceFEPime (MAXIPIME) IV     acetaminophen **OR** acetaminophen

## 2023-02-04 NOTE — Progress Notes (Addendum)
Pharmacy Antibiotic Note  [name]. is a 52 y.o. male for which pharmacy has been consulted for cefepime and vancomycin dosing for R foot wound infection  with abscess and osteomyelitis s/p partial fourth ray amputation and resection of fifth metatarsal 12/13. Noted patient is on HD TTS outpatient.   Planning for HD today off schedule. Received vancomycin 2g on 12/12. Will give vancomycin post HD.   Plan: Cefepime 1g q24hr given after HD on HD days Vancomycin 750mg  Q post HD 12/14 and TTS Monitor cultures, clinical status, HD schedule, vancomycin level Narrow abx as able and f/u duration    Height: 6\' 3"  (190.5 cm) Weight: 79.4 kg (175 lb) IBW/kg (Calculated) : 84.5  Temp (24hrs), Avg:98.3 F (36.8 C), Min:97.6 F (36.4 C), Max:99.2 F (37.3 C)  Recent Labs  Lab 02/03/23 1115 02/03/23 1256 02/04/23 0331  WBC 22.3*  --  22.5*  CREATININE 18.57*  --  18.70*  LATICACIDVEN  --  1.1  --     Estimated Creatinine Clearance: 5.1 mL/min (A) (by C-G formula based on SCr of 18.7 mg/dL (H)).    Allergies  Allergen Reactions   Promethazine Diarrhea and Other (See Comments)    Muscle cramps   Promethazine Hcl Other (See Comments)    Cramping all over  Other reaction(s): cramp all over   Other     Other reaction(s): Unknown Other reaction(s): Unknown   Antimicrobials: cefe 12/13>> MTZ 12/13  Vanc 12/13>>  Microbiology  12/13 Bcx: ngtd 12/13 OR culture: abundant GPC, rare GNC   Thank you for allowing pharmacy to be a part of this patient's care.  Alphia Moh, PharmD, BCPS, BCCP Clinical Pharmacist  Please check AMION for all [facility] Pharmacy phone numbers After 10:00 PM, call Main Pharmacy [phone]

## 2023-02-04 NOTE — Plan of Care (Signed)
  Problem: Clinical Measurements: Goal: Will remain free from infection Outcome: Progressing   Problem: Activity: Goal: Risk for activity intolerance will decrease Outcome: Not Progressing

## 2023-02-04 NOTE — Progress Notes (Signed)
PROGRESS NOTE    [name] Glori Luis.  JWJ:[id number] DOB: 1970/10/27 DOA: 02/03/2023 PCP: [name], FNP    Brief Narrative:   [name] Dennie. is a 52 y.o. male with medical history significant of type 2 diabetes mellitus, acute osteomyelitis of toe of right foot with gangrene status post amputation of the great toe on the right foot, paroxysmal atrial fibrillation not on anticoagulation, hypertension, ESRD on HD, seizure disorder, BPH, diabetic polyneuropathy, GERD, gastroparesis, hypertension was sent to ED from podiatry office.  Per patient and his wife who is at the bedside, patient's wife noted a wound on his right foot on Sunday.  He called his podiatrist/Dr. Ardelle Anton and was given an appointment to see today.  Being seen in the podiatry office today, he was noted to have significant pus and infected wound, he was directed to the emergency department for further workup and admission.    Assessment and Plan: Diabetic infected right foot wound: Concern for osteomyelitis on MRI   -podiatry to take to the OR on 12/13 -IV abx per podiatry   Paroxysmal atrial fibrillation: Currently in sinus rhythm.  Resume home amiodarone and patient is not on any anticoagulation due to history of intraventricular hemorrhage and 10/2021.   Type 2 diabetes mellitus: His wife says that his diabetes has been "reversed" since he had some sort of surgery for his gastroparesis several years ago and his recent hemoglobin A1c was 4.2   History of CVA/hyperlipidemia:  - Continue atorvastatin.   Essential hypertension:  -amlodipine and Coreg.   History of seizure: Resume Keppra.   ESRD on HD: Nephrology consulted.     CAD status post DES to LAD: Asymptomatic - not on any antiplatelets due to intraventricular hemorrhage history last year.   DVT prophylaxis:     Code Status: Full Code Family Communication: at bedside  Disposition Plan:  Level of care: Progressive Status is: Inpatient Remains  inpatient appropriate     Consultants:  Nephrology podiatry   Subjective: Asking about MRI results   Objective: Vitals:   02/03/23 2300 02/04/23 0321 02/04/23 0827 02/04/23 1015  BP: 117/65 117/65 127/65 123/65  Pulse: (!) 56 (!) 55 (!) 58 (!) 58  Resp: 14 11 (!) 6 17  Temp: 98 F (36.7 C) 98.5 F (36.9 C) 99.2 F (37.3 C) 98.1 F (36.7 C)  TempSrc: Oral Oral Oral Oral  SpO2: 99% 93% 97% 96%  Weight:  79.6 kg  79.4 kg  Height:    6\' 3"  (1.905 m)    Intake/Output Summary (Last 24 hours) at 02/04/2023 1049 Last data filed at 02/04/2023 0018 Gross per 24 hour  Intake 1104.3 ml  Output --  Net 1104.3 ml   Filed Weights   02/03/23 1111 02/04/23 0321 02/04/23 1015  Weight: 82.6 kg 79.6 kg 79.4 kg    Examination:   General: Appearance:    Well developed, well nourished male in no acute distress     Lungs:     Clear to auscultation bilaterally, respirations unlabored  Heart:    Bradycardic.    MS:   Prior amputations noted   Neurologic:   Awake, alert, oriented x 3. No apparent focal neurological           defect.        Data Reviewed: I have personally reviewed following labs and imaging studies  CBC: Recent Labs  Lab 02/03/23 1115 02/04/23 0331  WBC 22.3* 22.5*  NEUTROABS 19.5*  --   HGB 8.9* 8.5*  HCT 28.7* 26.4*  MCV 73.4* 69.8*  PLT 203 206   Basic Metabolic Panel: Recent Labs  Lab 02/03/23 1115 02/03/23 1120 02/04/23 0331  NA 141  --  138  K 4.4  --  4.6  CL 104  --  104  CO2 13*  --  15*  GLUCOSE 82  --  99  BUN 122*  --  132*  CREATININE 18.57*  --  18.70*  CALCIUM 9.6  --  8.8*  MG  --  2.4  --    GFR: Estimated Creatinine Clearance: 5.1 mL/min (A) (by C-G formula based on SCr of 18.7 mg/dL (H)). Liver Function Tests: Recent Labs  Lab 02/03/23 1115  AST 12*  ALT 10  ALKPHOS 65  BILITOT 0.9  PROT 6.9  ALBUMIN 2.7*   No results for input(s): "LIPASE", "AMYLASE" in the last 168 hours. No results for input(s): "AMMONIA"  in the last 168 hours. Coagulation Profile: No results for input(s): "INR", "PROTIME" in the last 168 hours. Cardiac Enzymes: No results for input(s): "CKTOTAL", "CKMB", "CKMBINDEX", "TROPONINI" in the last 168 hours. BNP (last 3 results) No results for input(s): "PROBNP" in the last 8760 hours. HbA1C: No results for input(s): "HGBA1C" in the last 72 hours. CBG: Recent Labs  Lab 02/03/23 1117 02/04/23 1014  GLUCAP 84 97   Lipid Profile: No results for input(s): "CHOL", "HDL", "LDLCALC", "TRIG", "CHOLHDL", "LDLDIRECT" in the last 72 hours. Thyroid Function Tests: No results for input(s): "TSH", "T4TOTAL", "FREET4", "T3FREE", "THYROIDAB" in the last 72 hours. Anemia Panel: No results for input(s): "VITAMINB12", "FOLATE", "FERRITIN", "TIBC", "IRON", "RETICCTPCT" in the last 72 hours. Sepsis Labs: Recent Labs  Lab 02/03/23 1256  LATICACIDVEN 1.1    Recent Results (from the past 240 hours)  Culture, blood (routine x 2)     Status: None (Preliminary result)   Collection Time: 02/03/23 12:22 PM   Specimen: BLOOD RIGHT ARM  Result Value Ref Range Status   Specimen Description BLOOD RIGHT ARM  Final   Special Requests   Final    BOTTLES DRAWN AEROBIC AND ANAEROBIC Blood Culture results may not be optimal due to an inadequate volume of blood received in culture bottles   Culture   Final    NO GROWTH < 24 HOURS Performed at [facility], [address], [place], [place] [address]    Report Status PENDING  Incomplete  Culture, blood (routine x 2)     Status: None (Preliminary result)   Collection Time: 02/03/23 12:27 PM   Specimen: BLOOD RIGHT HAND  Result Value Ref Range Status   Specimen Description BLOOD RIGHT HAND  Final   Special Requests   Final    BOTTLES DRAWN AEROBIC AND ANAEROBIC Blood Culture results may not be optimal due to an inadequate volume of blood received in culture bottles   Culture   Final    NO GROWTH < 24 HOURS Performed at [facility],  [address] [place], [place] [address]    Report Status PENDING  Incomplete         Radiology Studies: MR FOOT RIGHT WO CONTRAST Result Date: 02/03/2023 CLINICAL DATA:  Soft tissue infection. EXAM: MRI OF THE RIGHT FOREFOOT WITHOUT CONTRAST TECHNIQUE: Multiplanar, multisequence MR imaging of the forefoot was performed. No intravenous contrast was administered. COMPARISON:  Radiographs 02/03/2023 FINDINGS: Despite efforts by the technologist and patient, motion artifact is present on today's exam and could not be eliminated. This reduces exam sensitivity and specificity. Bones/Joint/Cartilage  Previous small toe amputation at the MTP joint. Cortical irregularity and mild marrow edema in the head of the fifth metatarsal suspicious for early osteomyelitis. Ligaments Lisfranc ligament intact Muscles and Tendons Substantial fluid/edema tracking within along the quadratus plantae,, abductor hallucis, flexor digitorum brevis, third and fourth plantar and dorsal interosseous, and abductor digiti minimi muscles, confluent with subcutaneous edema which is primarily infiltrative but which may have small fluid pockets tracking dorsal lateral to the fifth metatarsal, dorsal to the fourth metatarsal and into the fourth toe, and in the plantar subcutaneous tissues below the distal fourth and fifth metatarsals. Appearance concerning for active infection. Soft tissues Subcutaneous edema and fluid contiguous with intramuscular and fascia plane collections in the forefoot as noted above. IMPRESSION: 1. Substantial fluid/edema tracking within along the quadratus plantae, abductor hallucis, flexor digitorum brevis, third and fourth plantar and dorsal interosseous, and abductor digiti minimi muscles, confluent with subcutaneous edema which is primarily infiltrative but which may have small fluid pockets tracking dorsal lateral to the fifth metatarsal, dorsal to the fourth metatarsal and into the fourth toe, and in the plantar  subcutaneous tissues below the distal fourth and fifth metatarsals. Appearance concerning for active infection. 2. Cortical irregularity and mild marrow edema in the head of the fifth metatarsal suspicious for early osteomyelitis. 3. Previous small toe amputation at the MTP joint. Electronically Signed   By: Gaylyn Rong M.D.   On: 02/03/2023 19:38   DG Foot Complete Right Result Date: 02/03/2023 Please see detailed radiograph report in office note.  DG Foot Complete Right Result Date: 02/03/2023 CLINICAL DATA:  Right foot wound EXAM: RIGHT FOOT COMPLETE - 3 VIEW COMPARISON:  Earlier same day right foot radiographs, 10/06/2022 FINDINGS: Postsurgical changes from small toe amputation. Diffuse osteopenia. Extensive soft tissue vascular calcifications. Dressing material overlying the lateral forefoot, likely over the reported wound. No discrete soft tissue abnormality is seen. No definite cortical abnormality of the lateral forefoot. IMPRESSION: 1. Postsurgical changes from small toe amputation. No definite cortical abnormality of the lateral forefoot. 2. Diffuse osteopenia. Electronically Signed   By: Agustin Cree M.D.   On: 02/03/2023 12:16        Scheduled Meds:  [MAR Hold] amiodarone  200 mg Oral Daily   [MAR Hold] amLODipine  5 mg Oral Daily   [MAR Hold] amphetamine-dextroamphetamine  5 mg Oral Daily   [MAR Hold] atorvastatin  80 mg Oral QHS   [MAR Hold] calcium acetate  1,334 mg Oral TID WC   [MAR Hold] carvedilol  25 mg Oral BID   [MAR Hold] dorzolamide-timolol  1 drop Right Eye BID   [MAR Hold] levETIRAcetam  250 mg Oral Q T,Th,Sa-HD   [MAR Hold] levETIRAcetam  500 mg Oral QPC breakfast   [MAR Hold] sodium chloride flush  3 mL Intravenous Q12H   Continuous Infusions:  sodium chloride 10 mL/hr at 02/04/23 1021   [MAR Hold] ceFEPime (MAXIPIME) IV       LOS: 1 day    Time spent: 45 minutes spent on chart review, discussion with nursing staff, consultants, updating family and  interview/physical exam; more than 50% of that time was spent in counseling and/or coordination of care.    Joseph Art, DO [facility] Available via Epic secure chat 7am-7pm After these hours, please refer to coverage provider listed on [email or web address] 02/04/2023, 10:49 AM

## 2023-02-04 NOTE — Anesthesia Preprocedure Evaluation (Addendum)
Anesthesia Evaluation  Patient identified by MRN, date of birth, ID band Patient awake    Reviewed: Allergy & Precautions, NPO status , Patient's Chart, lab work & pertinent test results, reviewed documented beta blocker date and time   Airway Mallampati: II  TM Distance: >3 FB Neck ROM: Full    Dental  (+) Teeth Intact, Dental Advisory Given   Pulmonary neg pulmonary ROS   Pulmonary exam normal breath sounds clear to auscultation       Cardiovascular hypertension, Pt. on home beta blockers and Pt. on medications (-) angina + CAD, + Past MI, + Cardiac Stents and + Peripheral Vascular Disease  Normal cardiovascular exam+ dysrhythmias Atrial Fibrillation  Rhythm:Regular Rate:Normal     Neuro/Psych Seizures -,  PSYCHIATRIC DISORDERS Anxiety Depression    CVA    GI/Hepatic Neg liver ROS,GERD  ,,  Endo/Other  diabetes, Type 2    Renal/GU ESRF and DialysisRenal disease (TTHSAT)     Musculoskeletal  (+) Arthritis ,  Osteomyelitis right foot   Abdominal   Peds  Hematology  (+) Blood dyscrasia, anemia   Anesthesia Other Findings   Reproductive/Obstetrics                             Anesthesia Physical Anesthesia Plan  ASA: 3  Anesthesia Plan: Regional   Post-op Pain Management: Tylenol PO (pre-op)*   Induction: Intravenous  PONV Risk Score and Plan: 2 and TIVA, Ondansetron and Dexamethasone  Airway Management Planned: Natural Airway and Simple Face Mask  Additional Equipment:   Intra-op Plan:   Post-operative Plan:   Informed Consent: I have reviewed the patients History and Physical, chart, labs and discussed the procedure including the risks, benefits and alternatives for the proposed anesthesia with the patient or authorized representative who has indicated his/her understanding and acceptance.     Dental advisory given  Plan Discussed with: CRNA  Anesthesia Plan Comments:         Anesthesia Quick Evaluation

## 2023-02-04 NOTE — Transfer of Care (Signed)
Immediate Anesthesia Transfer of Care Note  Patient: [name].  Procedure(s) Performed: AMPUTATION 4TH AND 5TH METATARSAL (Right)  Patient Location: PACU  Anesthesia Type:MAC and Regional  Level of Consciousness: drowsy  Airway & Oxygen Therapy: Patient Spontanous Breathing  Post-op Assessment: Report given to RN and Post -op Vital signs reviewed and stable  Post vital signs: Reviewed and stable  Last Vitals:  Vitals Value Taken Time  BP 80/44 02/04/23 1245  Temp    Pulse 55 02/04/23 1245  Resp 17 02/04/23 1245  SpO2 95 % 02/04/23 1245  Vitals shown include unfiled device data.  Last Pain:  Vitals:   02/04/23 1017  TempSrc:   PainSc: 9       Patients Stated Pain Goal: 1 (02/04/23 1017)  Complications: No notable events documented.

## 2023-02-04 NOTE — Anesthesia Procedure Notes (Signed)
Anesthesia Regional Block: Popliteal block   Pre-Anesthetic Checklist: , timeout performed,  Correct Patient, Correct Site, Correct Laterality,  Correct Procedure, Correct Position, site marked,  Risks and benefits discussed,  Surgical consent,  Pre-op evaluation,  At surgeon's request and post-op pain management  Laterality: Right  Prep: chloraprep       Needles:  Injection technique: Single-shot  Needle Type: Echogenic Needle     Needle Length: 9cm  Needle Gauge: 21     Additional Needles:   Procedures:,,,, ultrasound used (permanent image in chart),,    Narrative:  Start time: 02/04/2023 11:16 AM End time: 02/04/2023 11:21 AM Injection made incrementally with aspirations every 5 mL.  Performed by: Personally  Anesthesiologist: Collene Schlichter, MD  Additional Notes: No pain on injection. No increased resistance to injection. Injection made in 5cc increments.  Good needle visualization.  Patient tolerated procedure well.

## 2023-02-04 NOTE — Anesthesia Procedure Notes (Signed)
Anesthesia Regional Block: Adductor canal block   Pre-Anesthetic Checklist: , timeout performed,  Correct Patient, Correct Site, Correct Laterality,  Correct Procedure, Correct Position, site marked,  Risks and benefits discussed,  Surgical consent,  Pre-op evaluation,  At surgeon's request and post-op pain management  Laterality: Right  Prep: chloraprep       Needles:  Injection technique: Single-shot  Needle Type: Echogenic Needle     Needle Length: 9cm  Needle Gauge: 21     Additional Needles:   Procedures:,,,, ultrasound used (permanent image in chart),,    Narrative:  Start time: 02/04/2023 11:21 AM End time: 02/04/2023 11:26 AM Injection made incrementally with aspirations every 5 mL.  Performed by: Personally  Anesthesiologist: Collene Schlichter, MD  Additional Notes: No pain on injection. No increased resistance to injection. Injection made in 5cc increments.  Good needle visualization.  Patient tolerated procedure well.

## 2023-02-04 NOTE — Consult Note (Signed)
PODIATRY CONSULTATION  NAME [name]. MRN [id number] DOB 1970-12-10 DOA 02/03/2023   Reason for consult:  Chief Complaint  Patient presents with   Foot Problem    right    Attending/Consulting physician: Verner Mould DO  History of present illness from Dr. Benjamine Macdonald today: "52 y.o. male  with medical history significant of type 2 diabetes mellitus, acute osteomyelitis of toe of right foot with gangrene status post amputation of the great toe on the right foot, paroxysmal atrial fibrillation not on anticoagulation, hypertension, ESRD on HD, seizure disorder, BPH, diabetic polyneuropathy, GERD, gastroparesis, hypertension was sent to ED from podiatry office.  Per patient and his wife who is at the bedside, patient's wife noted a wound on his right foot on Sunday.  He called his podiatrist/Dr. Ardelle Anton and was given an appointment to see today.  Being seen in the podiatry office today, he was noted to have significant pus and infected wound, he was directed to the emergency department for further workup and admission. "  Patient states that the wound got worse recently.  Not sure how it started.  Does report some sensation in his right foot.  Past Medical History:  Diagnosis Date   Acute bilateral cerebral infarction in a watershed distribution [facility]) 04/21/2021   a.) MRI brain 04/21/2021 --> patchy ACUTE cortically-based infarcts within the bilateral high frontal lobes and right parietal lobe   Acute cerebral infarction (HCC) 02/13/2021   a.) MRI brain 02/13/2021: acute LEFT hippocampal infarct   Acute cerebral infarction (HCC) 04/21/2021   a.) MRI brain 04/21/2021: subcentimeter ACUTE infarct within the callosal splenium on the LEFT   Anemia of chronic renal failure    Anxiety    Aortic dilatation (HCC) 11/25/2020   a.) TTE 11/25/2020: Ao root measured 38 mm. b.) TTE 04/22/2021: Ao root measured 44 mm; c. 11/2021 Echo: Ao root 40mm.   Atrial fibrillation (HCC)    a.) CHA2DS2-VASc = 6  (CHF, HTN, CVA x2, prior MI, T2DM). b.) rate/rhythm maintained on oral amiodarone + carvedilol; chronic OAC/AP therapy discontinued following ICH   BPH (benign prostatic hyperplasia)    Cardiac arrest (HCC) 07/26/2021   a.) in setting of hyperkalemia, NSTEMI, and seizure following missed HD; pulseless and apneic --> required 1 round of CPR prior to ROSC   Cellulitis    Cerebral hemorrhage (HCC) 11/21/2021   a.) CT head 11/21/2021 --> 1.4 x 1.5 x 3.1 cm ACUTE hemorrhage within the inferior aspect of the fourth ventricle, and extending inferiorly through the foramen of foramen of [place] --> occurred in setting of HTN emergency and DOAC/APT-->eliquis/plavix d/c'd.   Cerebral microvascular disease    Chronic diarrhea    Chronic heart failure with preserved ejection fraction (HFpEF) (HCC) 07/19/2017   a.) 06/2017 Echo: EF 75%; b.) 11/2020 Echo: EF 55-60%; c.) 04/2021 Echo: EF 55-60%; d.) 11/2021 Echo: EF 65-70%, GrII DD, nl RV fxn, sev dil LA, large circumferential pericardial eff w/o tamponade, triv MR, AoV sclerosis. Ao root 40mm; e.) TTE 02/28/2025: EF 60-65%, sev LVH, mod posterior LV effusion, AoV sclerosis   Chronic pain of both ankles    Coronary artery disease 02/26/2020   a. 02/2020 Ant STEMI/PCI: LM nl, LAD 64m (3.5x30 Resolute Onyx), RI nl, LCX min irregs, RCA min irregs. EF 65%.   DDD (degenerative disc disease), cervical    Diabetes mellitus, type II (HCC)    Diabetic neuropathy (HCC)    Esophagitis    ESRD (end stage renal disease) on dialysis (HCC)  a.) [facility]; T-Th-Sat   Frequent falls    Gait instability    Gastritis    Gastroparesis    GERD (gastroesophageal reflux disease)    H/O enucleation of left eyeball    Heart palpitations    History of kidney stones    HLD (hyperlipidemia)    Hypertension    Insomnia    a.) uses melatonin PRN   Long term current use of amiodarone    Nausea and vomiting in adult    recurrent   NSTEMI (non-ST elevated myocardial infarction)  (HCC) 07/26/2021   a.) hyperkalemic from missed HD; seizures; decompensated to cardiac arrest and required CPR x 1 round prior to ROSC.   Osteomyelitis (HCC)    Pericardial effusion    a. 11/2021 Echo: EF 65-70%, GrII DD, nl RV fxn, sev dil LA, large circumferential pericardial eff w/o tamponade, triv MR, AoV sclerosis. Ao root 40mm.   Seizure (HCC)    a.) last 07/26/2021 in setting of missed HD --> hyperkalemic at 6.5 --> pulseless/apneic and required CPR; discharged home on levetiracetam.   ST elevation myocardial infarction (STEMI) of anterior wall (HCC) 02/26/2020   a.) LHC/PCI 02/26/2020 --> EF 65%; LVEDP 11 mmHg; 90% mLAD (3.5 x 20 mm Resolute Onyx DES x 1)       Latest Ref Rng & Units 02/04/2023    3:31 AM 02/03/2023   11:15 AM 01/27/2023    9:15 AM  CBC  WBC 4.0 - 10.5 K/uL 22.5  22.3    Hemoglobin 13.0 - 17.0 g/dL 8.5  8.9  16.1   Hematocrit 39.0 - 52.0 % 26.4  28.7  30.0   Platelets 150 - 400 K/uL 206  203         Latest Ref Rng & Units 02/04/2023    3:31 AM 02/03/2023   11:15 AM 01/27/2023    9:15 AM  BMP  Glucose 70 - 99 mg/dL 99  82  79   BUN 6 - 20 mg/dL 096  045  61   Creatinine 0.61 - 1.24 mg/dL 40.98  11.91  47.82   Sodium 135 - 145 mmol/L 138  141  139   Potassium 3.5 - 5.1 mmol/L 4.6  4.4  4.1   Chloride 98 - 111 mmol/L 104  104  105   CO2 22 - 32 mmol/L 15  13    Calcium 8.9 - 10.3 mg/dL 8.8  9.6        Physical Exam: Lower Extremity Exam Vasc: R - PT 1/ 4 palpable, DP 1/4 palpable. Cap refill < 3 sec to digits  L - PT 1/4 palpable, DP 1/4 palpable. Cap refill <3 sec to digits  Derm: R -dressing clean dry and intact to right foot per pictures large ulceration at the lateral aspect of the right foot near the fifth metatarsal head and neck area.  Purulence draining.  Erythema and edema of the area.  L - Normal temp/texture/turgor with no open lesion or clinical signs of infection    MSK:  R -prior right fifth toe amputation many years ago  L -  No  gross deformities. Compartments soft, non-tender, compressible  Neuro: R - Gross sensation diminished. Gross motor function intact   L - Gross sensation diminished. Gross motor function intact    ASSESSMENT/PLAN OF CARE 52 y.o. male with PMHx significant for DM2 with prior fifth toe amputation and PAD with osteomyelitis of the fifth metatarsal with associated cellulitis and abscess of the right lateral forefoot  WBC 22.5  02/03/2023 MRI right foot without contrast -Abscess dorsal lateral fifth metatarsal, cortical irregularity marrow edema of the head of the fifth metatarsal concerning for early osteomyelitis  -N.p.o. 4 OR today for partial resection of fifth metatarsal with incision and drainage of surrounding abscess, antibiotic bead application, likely delayed closure. -Due to extent of infection will likely require secondary operation in 2 to 3 days for repeat washout and closure - Continue IV abx broad spectrum pending further culture data - Anticoagulation: Hold pending OR and hold after due to delayed closure of wound - Wound care: Leave surgical dressing clean dry and intact postop - WB status: Nonweightbearing to the right foot postoperatively - Will continue to follow   Thank you for the consult.  Please contact me directly with any questions or concerns.           Corinna Gab, DPM [facility] / [facility]    [address] [place], [place] [address]                Office [phone]  Fax [phone]

## 2023-02-04 NOTE — Op Note (Addendum)
Full Operative Report  Date of Operation: 12:36 PM, 02/04/2023   Patient: [name]. - 52 y.o. male  Surgeon: Pilar Plate, DPM   Assistant: None  Diagnosis: Osteomyelitis right foot, gangrene right foot  Procedure:  1.  Partial fourth ray amputation, right foot 2.  Further resection of fifth metatarsal bone, right foot 3.  Application of antibiotic beads, right foot     Anesthesia: Anesthesia type not filed in the log.  Collene Schlichter, MD  Anesthesiologist: Collene Schlichter, MD CRNA: Camillia Herter, CRNA   Estimated Blood Loss: 20 mL  Hemostasis: 1) Anatomical dissection, mechanical compression, electrocautery 2) no tourniquet was used during procedure  Implants: Arthrex antibiotic beads dissolvable calcium sulfate 5 cc  Materials: Iodoform packing  Injectables: 1) Pre-operatively: 10 cc half percent Marcaine plain 2) Post-operatively: None   Specimens: Pathology: Fourth and fifth ray for pathology microbiology: Deep tissue for culture and deep tissue swab   Antibiotics: IV antibiotics given per schedule on the floor  Drains: None  Complications: Patient tolerated the procedure well without complication.   Operative findings: As below in detailed report  Indications for Procedure: [name]. presents to Pilar Plate, DPM with a chief complaint of right foot ulceration concerning for gangrenous changes in the right lateral forefoot as well as osteomyelitis of the fifth metatarsal.  Patient has a prior fifth toe amputation many years ago.  The patient has failed conservative treatments of various modalities. At this time the patient has elected to proceed with surgical correction. All alternatives, risks, and complications of the procedures were thoroughly explained to the patient. Patient exhibits appropriate understanding of all discussion points and informed consent was signed and obtained in the chart with no guarantees  to surgical outcome given or implied.  Description of Procedure: Patient was brought to the operating room. Patient remained on their hospital bed in the supine position. A surgical timeout was performed and all members of the operating room, the procedure, and the surgical site were identified. anesthesia occurred as per anesthesia record. Local anesthetic as previously described was then injected about the operative field in a local infiltrative block.  The operative lower extremity as noted above was then prepped and draped in the usual sterile manner. The following procedure then began.  Attention was directed to the right lateral forefoot.  Incision was made down to bone level on the fifth ray.  On incision there was noted to be extensive purulence that was expressed approximately 10 to 20 cc.  Elliptical incision was carried out with excision of the ulceration and underlying tissues on the right lateral forefoot.  There is noted to be extensive necrosis of the soft tissues surrounding the ulceration as well as tracking plantarly to the plantar compartment of the foot.  There was near complete necrosis of all plantar interossei as well as the abductor digiti minimi muscle and plantar tendons and plantar fat pad.  Excisional debridement was performed with 15 blade and rongeur with excision of all necrotic and fibrotic tissues to the level of the fifth metatarsal bone.  Resection of the fifth metatarsal was then performed with transverse osteotomy near the fifth metatarsal base.  The fifth metatarsal bone was passed off the field and sent with pathology specimen.  Next the infectious changes were noted to be tracking plantarly along the plantar compartment towards the medial forefoot.  There is also extensive gangrenous changes that was tracking distally in the forefoot near to the fourth MPJ.  Upon visual inspection there is noted to be necrosis of the fourth proximal phalanx as well as the fourth  metatarsal head.  Decision was made to perform fourth ray amputation.  Then the fourth toe was disarticulated at the MPJ and passed to the back table to be sent with the pathology specimen the bone was noted to be eroded necrotic and black at the base of the proximal phalanx consistent with osteomyelitis and necrosis due to gangrene.  Additionally the fourth metatarsal also appeared necrotic and black and was soft.  The fourth metatarsal was then resected with a transverse osteotomy near the base of the fourth metatarsal with sagittal saw.  This was also sent with pathology.  Additional gangrenous and necrotic tissues were noted in the plantar compartment as well as dorsally above the third metatarsal and cuneiforms.  Rongeur was used to excisionally debride all necrotic tissues.  There was purulence that was expressed from the dorsal and plantar compartments from the remaining forefoot.  Next irrigation was performed with 3 L of sterile saline via power pulse lavage.  Upon completion there was noted to be no further purulence able to be expressed from the forefoot medially through the amputation site.  Next antibiotic beads mixed with vancomycin and tobramycin were prepared on the back table.  Small beads x 5 cc were then implanted in the surgical site.  Iodoform 1 inch packing soaked in Betadine was then placed within the surgical cavity and packed into the plantar compartment and laterally to the surgical site.  The surgical site was then dressed with 4 x 4 ABD Kerlix Ace. The patient tolerated both the procedure and anesthesia well with vital signs stable throughout. The patient was transferred in good condition and all vital signs stable  from the OR to recovery under the discretion of anesthesia.  Condition: Vital signs stable, neurovascular status unchanged from preoperative   Surgical plan:  Extensive infection of the plantar compartment and lateral forefoot with significant necrosis and infected  tissue was resected.  Likely nonsalvageable lower extremity due to loss of extensive soft tissues lateral foot as well as plantar foot.  Decision to be made in the next day or 2 regarding repeat irrigation and debridement versus below the knee amputation.  Continue to follow cultures continue IV antibiotics broad-spectrum leave dressing clean dry and intact reinforce as needed if strikethrough.  Nonweightbearing to the right foot and soft dressing.  The patient will be nonweightbearing in a soft dressing to the operative limb until further instructed. The dressing is to remain clean, dry, and intact. Will continue to follow unless noted elsewhere.   Carlena Hurl, DPM [facility]

## 2023-02-05 DIAGNOSIS — E11628 Type 2 diabetes mellitus with other skin complications: Secondary | ICD-10-CM | POA: Diagnosis not present

## 2023-02-05 DIAGNOSIS — L089 Local infection of the skin and subcutaneous tissue, unspecified: Secondary | ICD-10-CM | POA: Diagnosis not present

## 2023-02-05 LAB — BASIC METABOLIC PANEL
Anion gap: 16 — ABNORMAL HIGH (ref 5–15)
BUN: 65 mg/dL — ABNORMAL HIGH (ref 6–20)
CO2: 22 mmol/L (ref 22–32)
Calcium: 8.9 mg/dL (ref 8.9–10.3)
Chloride: 101 mmol/L (ref 98–111)
Creatinine, Ser: 10.81 mg/dL — ABNORMAL HIGH (ref 0.61–1.24)
GFR, Estimated: 5 mL/min — ABNORMAL LOW (ref >60)
Glucose, Bld: 90 mg/dL (ref 70–99)
Potassium: 3.2 mmol/L — ABNORMAL LOW (ref 3.5–5.1)
Sodium: 139 mmol/L (ref 135–145)

## 2023-02-05 LAB — CBC
HCT: 25.3 % — ABNORMAL LOW (ref 39.0–52.0)
Hemoglobin: 8.5 g/dL — ABNORMAL LOW (ref 13.0–17.0)
MCH: 23.2 pg — ABNORMAL LOW (ref 26.0–34.0)
MCHC: 33.6 g/dL (ref 30.0–36.0)
MCV: 69.1 fL — ABNORMAL LOW (ref 80.0–100.0)
Platelets: 209 10*3/uL (ref 150–400)
RBC: 3.66 MIL/uL — ABNORMAL LOW (ref 4.22–5.81)
RDW: 19.9 % — ABNORMAL HIGH (ref 11.5–15.5)
WBC: 22.6 10*3/uL — ABNORMAL HIGH (ref 4.0–10.5)
nRBC: 0 % (ref 0.0–0.2)

## 2023-02-05 LAB — MAGNESIUM: Magnesium: 1.8 mg/dL (ref 1.7–2.4)

## 2023-02-05 LAB — PHOSPHORUS: Phosphorus: 5.5 mg/dL — ABNORMAL HIGH (ref 2.5–4.6)

## 2023-02-05 MED ORDER — AMIODARONE LOAD VIA INFUSION: 150.0000 mg | Freq: Once | INTRAVENOUS | Status: DC

## 2023-02-05 MED ORDER — AMIODARONE HCL IN DEXTROSE 360-4.14 MG/200ML-% IV SOLN: 30.0000 mg/h | INTRAVENOUS | Status: DC

## 2023-02-05 MED ORDER — CAMPHOR-MENTHOL 0.5-0.5 % EX LOTN
TOPICAL_LOTION | CUTANEOUS | Status: DC | PRN
  Filled 2023-02-05: qty 222

## 2023-02-05 MED ORDER — POTASSIUM CHLORIDE CRYS ER 20 MEQ PO TBCR
20.0000 meq | EXTENDED_RELEASE_TABLET | Freq: Once | ORAL | Status: AC
  Administered 2023-02-05: 20 meq via ORAL
  Filled 2023-02-05: qty 1

## 2023-02-05 MED ORDER — MAGNESIUM SULFATE 2 GM/50ML IV SOLN
2.0000 g | Freq: Once | INTRAVENOUS | Status: AC
  Administered 2023-02-05: 2 g via INTRAVENOUS
  Filled 2023-02-05: qty 50

## 2023-02-05 MED ORDER — AMIODARONE HCL IN DEXTROSE 360-4.14 MG/200ML-% IV SOLN: 60.0000 mg/h | INTRAVENOUS | Status: DC

## 2023-02-05 MED ORDER — AMIODARONE HCL 200 MG PO TABS
200.0000 mg | ORAL_TABLET | Freq: Every day | ORAL | Status: DC
Start: 2023-02-06 — End: 2023-02-23
  Administered 2023-02-06 – 2023-02-23 (×16): 200 mg via ORAL
  Filled 2023-02-05 (×16): qty 1

## 2023-02-05 NOTE — Plan of Care (Signed)
  Problem: Education: Goal: Knowledge of General Education information will improve Description: Including pain rating scale, medication(s)/side effects and non-pharmacologic comfort measures Outcome: Progressing   Problem: Clinical Measurements: Goal: Respiratory complications will improve Outcome: Progressing   Problem: Activity: Goal: Risk for activity intolerance will decrease Outcome: Progressing   Problem: Nutrition: Goal: Adequate nutrition will be maintained Outcome: Progressing   Problem: Coping: Goal: Level of anxiety will decrease Outcome: Progressing   Problem: Pain Management: Goal: General experience of comfort will improve Outcome: Progressing   Problem: Safety: Goal: Ability to remain free from injury will improve Outcome: Progressing

## 2023-02-05 NOTE — Progress Notes (Signed)
PODIATRY PROGRESS NOTE  NAME [name]. MRN [id number] DOB [birth year] DOA 02/03/2023   Reason for consult:  Chief Complaint  Patient presents with   Foot Problem    right     History of present illness: 52 y.o. male POD # 1 s/p Right foot debridement, ray amputation and found to have extensive necrosis, loss of tissue to the plantar and lateral portion of the foot. Still having some pain. No fevers or chills.   Vitals:   02/05/23 0800 02/05/23 1106  BP:  (!) 98/50  Pulse: (!) 37 60  Resp: 11 17  Temp:  98.4 F (36.9 C)  SpO2: 99% 100%       Latest Ref Rng & Units 02/05/2023    6:20 AM 02/04/2023    3:31 AM 02/03/2023   11:15 AM  CBC  WBC 4.0 - 10.5 K/uL 22.6  22.5  22.3   Hemoglobin 13.0 - 17.0 g/dL 8.5  8.5  8.9   Hematocrit 39.0 - 52.0 % 25.3  26.4  28.7   Platelets 150 - 400 K/uL 209  206  203        Latest Ref Rng & Units 02/05/2023    6:20 AM 02/04/2023    3:31 AM 02/03/2023   11:15 AM  BMP  Glucose 70 - 99 mg/dL 90  99  82   BUN 6 - 20 mg/dL 65  629  528   Creatinine 0.61 - 1.24 mg/dL 41.32  44.01  02.72   Sodium 135 - 145 mmol/L 139  138  141   Potassium 3.5 - 5.1 mmol/L 3.2  4.6  4.4   Chloride 98 - 111 mmol/L 101  104  104   CO2 22 - 32 mmol/L 22  15  13    Calcium 8.9 - 10.3 mg/dL 8.9  8.8  9.6       Physical Exam: General: AAO x 3, NAD   Dermatology: Large ulceration noted to the lateral aspect close picture below with antibiotic beads present.  There is still some necrosis present plantarly on the foot.  Increasing pain.  There is no fluctuation or crepitation.  No crepitation in the leg.      Vascular: Foot appears to be warm and perfused  Neurological: Decreased   Musculoskeletal Exam: There is pain to the foot but no pain in the leg.     ASSESSMENT/PLAN OF CARE  He is currently afebrile. His WBC has not improved over the last 2 days and there is extensive loss of soft tissue to the plantar and lateral portion of the  foot. We could attempt a proximal amputation of the foot but I think that a BKA would be his best option. I have discussed with the patient and his wife at bedside. The patient states he knew this was a possibility and he has no questions about it. His wife is asking about getting help at home for him as he cannot take care of his foot and he cannot see it well. We discussed that social work/case management will see him while he is here to help arrange the assistance that is needed. I would recommend consulting [name] at this time and I have let the patient/wife know.  No significant from arterial studies.  We will continue to follow for now.    Please contact me directly with any questions or concerns.     Ovid Curd, DPM [facility]  Dr. Lesia Sago. Ardelle Anton, DPM  [address] [place], [place] [address]                Office [phone]  Fax [phone]

## 2023-02-05 NOTE — Plan of Care (Signed)
  Problem: Education: Goal: Knowledge of General Education information will improve Description: Including pain rating scale, medication(s)/side effects and non-pharmacologic comfort measures Outcome: Progressing   Problem: Health Behavior/Discharge Planning: Goal: Ability to manage health-related needs will improve Outcome: Progressing   Problem: Clinical Measurements: Goal: Ability to maintain clinical measurements within normal limits will improve Outcome: Progressing Goal: Will remain free from infection Outcome: Progressing Goal: Diagnostic test results will improve Outcome: Progressing   Problem: Activity: Goal: Risk for activity intolerance will decrease Outcome: Progressing   Problem: Pain Management: Goal: General experience of comfort will improve Outcome: Progressing   Problem: Safety: Goal: Ability to remain free from injury will improve Outcome: Progressing   Problem: Skin Integrity: Goal: Risk for impaired skin integrity will decrease Outcome: Progressing

## 2023-02-05 NOTE — Plan of Care (Signed)
  Problem: Education: Goal: Knowledge of General Education information will improve Description: Including pain rating scale, medication(s)/side effects and non-pharmacologic comfort measures Outcome: Progressing   Problem: Health Behavior/Discharge Planning: Goal: Ability to manage health-related needs will improve Outcome: Progressing   Problem: Clinical Measurements: Goal: Ability to maintain clinical measurements within normal limits will improve Outcome: Progressing Goal: Will remain free from infection Outcome: Progressing Goal: Diagnostic test results will improve Outcome: Progressing Goal: Cardiovascular complication will be avoided Outcome: Progressing   Problem: Activity: Goal: Risk for activity intolerance will decrease Outcome: Progressing   Problem: Nutrition: Goal: Adequate nutrition will be maintained Outcome: Progressing   Problem: Elimination: Goal: Will not experience complications related to bowel motility Outcome: Progressing Goal: Will not experience complications related to urinary retention Outcome: Progressing

## 2023-02-05 NOTE — Progress Notes (Signed)
[facility] Progress Note  Subjective: seen in room, had HD last night w/ 3 L off. Looks better today overall  Vitals:   02/05/23 0227 02/05/23 0300 02/05/23 0800 02/05/23 1106  BP: 119/70 117/66  (!) 98/50  Pulse: 70 69 (!) 37 60  Resp: 19 18 11 17   Temp: 98.6 F (37 C) 98.8 F (37.1 C)  98.4 F (36.9 C)  TempSrc: Oral Oral  Oral  SpO2: 100% 100% 99% 100%  Weight:      Height:        Exam: Gen alert, no distress, chronically ill appearing Looks better No jvd or bruits Chest L clear, R dec'd at base RRR no RG Abd soft ntnd no mass or ascites +bs Ext no sig edema today, better Neuro is alert, Ox 3 , nf     LUA AVF+bruit       Renal-related home meds: - norvasc, coreg, hydralazine - phoslo 2 ac tid      OP HD: [facility] TTS  3.5h  350/ 600   ? Dry wt   2/2.5 (from feb 2024, needs updating)     Na 138  BUN 132  creat 18  K+ 4.6      Assessment/ Plan: Diabetic foot infection - on IV abx, poss osteo per MRI. Per pmd/ podiatry.  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 Has missed dialysis w/ high Bun/Cr. Got HD last night. Labs/ volume are fine today. Next HD off schedule on Monday, cont MWF while here and switch back to TTS when dc'd.  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 Follow.  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 Hold any vdra. Add phos, cont binders w/ meals.     Vinson Moselle MD  [facility] 02/05/2023, 4:23 PM  Recent Labs  Lab 02/03/23 1115 02/04/23 0331 02/05/23 0620  HGB 8.9* 8.5* 8.5*  ALBUMIN 2.7*  --   --   CALCIUM 9.6 8.8* 8.9  CREATININE 18.57* 18.70* 10.81*  K 4.4 4.6 3.2*   No results for input(s): "IRON", "TIBC", "FERRITIN" in the last 168 hours. Inpatient medications:  [START ON 02/06/2023] amiodarone  200 mg Oral Daily   amphetamine-dextroamphetamine  5 mg Oral Daily   atorvastatin  80 mg Oral QHS   calcium acetate  1,334 mg Oral TID WC   carvedilol  12.5 mg Oral BID   Chlorhexidine  Gluconate Cloth  6 each Topical Q0600   dorzolamide-timolol  1 drop Right Eye BID   levETIRAcetam  250 mg Oral Q T,Th,Sa-HD   levETIRAcetam  500 mg Oral QPC breakfast   sodium chloride flush  3 mL Intravenous Q12H    ceFEPime (MAXIPIME) IV 1 g (02/05/23 0324)   [START ON 02/08/2023] vancomycin     acetaminophen **OR** acetaminophen, camphor-menthol

## 2023-02-05 NOTE — Progress Notes (Addendum)
PROGRESS NOTE    [name].  ZOX:[id number] DOB: November 03, 1970 DOA: 02/03/2023 PCP: [name], FNP    Brief Narrative:   [name]. is a 52 y.o. male with medical history significant of type 2 diabetes mellitus, acute osteomyelitis of toe of right foot with gangrene status post amputation of the great toe on the right foot, paroxysmal atrial fibrillation not on anticoagulation, hypertension, ESRD on HD, seizure disorder, BPH, diabetic polyneuropathy, GERD, gastroparesis, hypertension was sent to ED from podiatry office.  Per patient and his wife who is at the bedside, patient's wife noted a wound on his right foot on Sunday.  He called his podiatrist/Dr. Ardelle Anton and was given an appointment to see today.  Being seen in the podiatry office today, he was noted to have significant pus and infected wound, he was directed to the emergency department for further workup and admission.    Assessment and Plan: Diabetic infected right foot wound: Concern for osteomyelitis on MRI   -podiatry took to the OR on 12/13 12/14: patient is going to need a BKA. There is loss of the plantar tissue and his WBC has not improved.  -IV abx -- cultures showing staph. Aureus  -consulted with [name]   Paroxysmal atrial fibrillation:  -went into a fib today with low BP but converted back -continue home amiodarone  - patient is not on any anticoagulation due to history of intraventricular hemorrhage and 10/2021.   Type 2 diabetes mellitus: His wife says that his diabetes has been "reversed" since he had some sort of surgery for his gastroparesis several years ago and his recent hemoglobin A1c was 4.2   History of CVA/hyperlipidemia:  - Continue atorvastatin.   Essential hypertension:  -BP low, hold norvasc so coreg can be adjust for better HR control   History of seizure: Resume Keppra.   ESRD on HD: Nephrology consulted.   -HD 12/13 PM   CAD status post DES to LAD: Asymptomatic - not  on any antiplatelets due to intraventricular hemorrhage history last year.  Hypokalemia -replete  DVT prophylaxis:     Code Status: Full Code Family Communication: at bedside  Disposition Plan:  Level of care: Progressive Status is: Inpatient Remains inpatient appropriate     Consultants:  Nephrology podiatry   Subjective: tired  Objective: Vitals:   02/05/23 0227 02/05/23 0300 02/05/23 0800 02/05/23 1106  BP: 119/70 117/66  (!) 98/50  Pulse: 70 69 (!) 37 60  Resp: 19 18 11 17   Temp: 98.6 F (37 C) 98.8 F (37.1 C)  98.4 F (36.9 C)  TempSrc: Oral Oral  Oral  SpO2: 100% 100% 99% 100%  Weight:      Height:        Intake/Output Summary (Last 24 hours) at 02/05/2023 1308 Last data filed at 02/05/2023 0600 Gross per 24 hour  Intake 250 ml  Output 2000 ml  Net -1750 ml   Filed Weights   02/03/23 1111 02/04/23 0321 02/04/23 1015  Weight: 82.6 kg 79.6 kg 79.4 kg    Examination:   General: Appearance:    Well developed, well nourished male in no acute distress     Lungs:    respirations unlabored  Heart:    Normal heart rate.    MS:   Prior amputations noted   Neurologic:   Awake, alert       Data Reviewed: I have personally reviewed following labs and imaging studies  CBC: Recent Labs  Lab 02/03/23 1115  02/04/23 0331 02/05/23 0620  WBC 22.3* 22.5* 22.6*  NEUTROABS 19.5*  --   --   HGB 8.9* 8.5* 8.5*  HCT 28.7* 26.4* 25.3*  MCV 73.4* 69.8* 69.1*  PLT 203 206 209   Basic Metabolic Panel: Recent Labs  Lab 02/03/23 1115 02/03/23 1120 02/04/23 0331 02/05/23 0620  NA 141  --  138 139  K 4.4  --  4.6 3.2*  CL 104  --  104 101  CO2 13*  --  15* 22  GLUCOSE 82  --  99 90  BUN 122*  --  132* 65*  CREATININE 18.57*  --  18.70* 10.81*  CALCIUM 9.6  --  8.8* 8.9  MG  --  2.4  --  1.8   GFR: Estimated Creatinine Clearance: 8.8 mL/min (A) (by C-G formula based on SCr of 10.81 mg/dL (H)). Liver Function Tests: Recent Labs  Lab  02/03/23 1115  AST 12*  ALT 10  ALKPHOS 65  BILITOT 0.9  PROT 6.9  ALBUMIN 2.7*   No results for input(s): "LIPASE", "AMYLASE" in the last 168 hours. No results for input(s): "AMMONIA" in the last 168 hours. Coagulation Profile: No results for input(s): "INR", "PROTIME" in the last 168 hours. Cardiac Enzymes: No results for input(s): "CKTOTAL", "CKMB", "CKMBINDEX", "TROPONINI" in the last 168 hours. BNP (last 3 results) No results for input(s): "PROBNP" in the last 8760 hours. HbA1C: No results for input(s): "HGBA1C" in the last 72 hours. CBG: Recent Labs  Lab 02/03/23 1117 02/04/23 1014 02/04/23 1242 02/04/23 1702  GLUCAP 84 97 86 97   Lipid Profile: No results for input(s): "CHOL", "HDL", "LDLCALC", "TRIG", "CHOLHDL", "LDLDIRECT" in the last 72 hours. Thyroid Function Tests: No results for input(s): "TSH", "T4TOTAL", "FREET4", "T3FREE", "THYROIDAB" in the last 72 hours. Anemia Panel: No results for input(s): "VITAMINB12", "FOLATE", "FERRITIN", "TIBC", "IRON", "RETICCTPCT" in the last 72 hours. Sepsis Labs: Recent Labs  Lab 02/03/23 1256  LATICACIDVEN 1.1    Recent Results (from the past 240 hours)  Culture, blood (routine x 2)     Status: None (Preliminary result)   Collection Time: 02/03/23 12:22 PM   Specimen: BLOOD RIGHT ARM  Result Value Ref Range Status   Specimen Description BLOOD RIGHT ARM  Final   Special Requests   Final    BOTTLES DRAWN AEROBIC AND ANAEROBIC Blood Culture results may not be optimal due to an inadequate volume of blood received in culture bottles   Culture   Final    NO GROWTH < 24 HOURS Performed at [facility], [address] [place], [place] [address]    Report Status PENDING  Incomplete  Culture, blood (routine x 2)     Status: None (Preliminary result)   Collection Time: 02/03/23 12:27 PM   Specimen: BLOOD RIGHT HAND  Result Value Ref Range Status   Specimen Description BLOOD RIGHT HAND  Final   Special Requests    Final    BOTTLES DRAWN AEROBIC AND ANAEROBIC Blood Culture results may not be optimal due to an inadequate volume of blood received in culture bottles   Culture   Final    NO GROWTH < 24 HOURS Performed at [facility], [address] [place], [place] [address]    Report Status PENDING  Incomplete  Aerobic/Anaerobic Culture w Gram Stain (surgical/deep wound)     Status: None (Preliminary result)   Collection Time: 02/04/23 12:06 PM   Specimen: Foot, Right; Tissue  Result Value Ref Range  Status   Specimen Description TISSUE  Final   Special Requests RIGHT FOOT  Final   Gram Stain   Final    FEW WBC PRESENT, PREDOMINANTLY PMN ABUNDANT GRAM POSITIVE COCCI IN PAIRS RARE GRAM NEGATIVE COCCOBACILLI    Culture   Final    ABUNDANT STAPHYLOCOCCUS AUREUS SUSCEPTIBILITIES TO FOLLOW Performed at [facility], [address] [place], [place] [address]    Report Status PENDING  Incomplete  Aerobic/Anaerobic Culture w Gram Stain (surgical/deep wound)     Status: None (Preliminary result)   Collection Time: 02/04/23 12:06 PM   Specimen: Foot, Right; Wound  Result Value Ref Range Status   Specimen Description WOUND  Final   Special Requests RIGHT FOOT  Final   Gram Stain   Final    MODERATE WBC PRESENT, PREDOMINANTLY PMN MODERATE GRAM POSITIVE COCCI Performed at [facility], [address], [place], [place] [address]    Culture ABUNDANT STAPHYLOCOCCUS AUREUS  Final   Report Status PENDING  Incomplete         Radiology Studies: DG Foot 2 Views Right Result Date: 02/04/2023 CLINICAL DATA:  Postoperative state.  Diabetic foot ulcer. EXAM: RIGHT FOOT - 2 VIEW COMPARISON:  Right foot radiographs 02/03/2023, MRI left forefoot FINDINGS: Interval revision amputation of the fifth digit, now to the proximal shaft of fifth metatarsal. New amputation of the fourth digit to the proximal shaft of the metatarsal. There are antibiotic beads overlying the prominently plantar aspect  of the second through fourth metatarsals. There is plantar midfoot soft tissue irregularity with overlying bandage material. High-grade vascular calcifications. IMPRESSION: 1. Interval revision amputation of the fifth digit, now to the proximal shaft of the fifth metatarsal. 2. New amputation of the fourth digit to the proximal shaft of the metatarsal. 3. Antibiotic beads overlying the prominently plantar aspect of the second through fourth metatarsals. 4. No unexpected postsurgical findings. Electronically Signed   By: Neita Garnet M.D.   On: 02/04/2023 15:27   MR FOOT RIGHT WO CONTRAST Result Date: 02/03/2023 CLINICAL DATA:  Soft tissue infection. EXAM: MRI OF THE RIGHT FOREFOOT WITHOUT CONTRAST TECHNIQUE: Multiplanar, multisequence MR imaging of the forefoot was performed. No intravenous contrast was administered. COMPARISON:  Radiographs 02/03/2023 FINDINGS: Despite efforts by the technologist and patient, motion artifact is present on today's exam and could not be eliminated. This reduces exam sensitivity and specificity. Bones/Joint/Cartilage Previous small toe amputation at the MTP joint. Cortical irregularity and mild marrow edema in the head of the fifth metatarsal suspicious for early osteomyelitis. Ligaments Lisfranc ligament intact Muscles and Tendons Substantial fluid/edema tracking within along the quadratus plantae,, abductor hallucis, flexor digitorum brevis, third and fourth plantar and dorsal interosseous, and abductor digiti minimi muscles, confluent with subcutaneous edema which is primarily infiltrative but which may have small fluid pockets tracking dorsal lateral to the fifth metatarsal, dorsal to the fourth metatarsal and into the fourth toe, and in the plantar subcutaneous tissues below the distal fourth and fifth metatarsals. Appearance concerning for active infection. Soft tissues Subcutaneous edema and fluid contiguous with intramuscular and fascia plane collections in the forefoot  as noted above. IMPRESSION: 1. Substantial fluid/edema tracking within along the quadratus plantae, abductor hallucis, flexor digitorum brevis, third and fourth plantar and dorsal interosseous, and abductor digiti minimi muscles, confluent with subcutaneous edema which is primarily infiltrative but which may have small fluid pockets tracking dorsal lateral to the fifth metatarsal, dorsal to the fourth metatarsal and into the fourth toe, and in  the plantar subcutaneous tissues below the distal fourth and fifth metatarsals. Appearance concerning for active infection. 2. Cortical irregularity and mild marrow edema in the head of the fifth metatarsal suspicious for early osteomyelitis. 3. Previous small toe amputation at the MTP joint. Electronically Signed   By: Gaylyn Rong M.D.   On: 02/03/2023 19:38   DG Foot Complete Right Result Date: 02/03/2023 Please see detailed radiograph report in office note.       Scheduled Meds:  amiodarone  150 mg Intravenous Once   amLODipine  5 mg Oral Daily   amphetamine-dextroamphetamine  5 mg Oral Daily   atorvastatin  80 mg Oral QHS   calcium acetate  1,334 mg Oral TID WC   carvedilol  12.5 mg Oral BID   Chlorhexidine Gluconate Cloth  6 each Topical Q0600   dorzolamide-timolol  1 drop Right Eye BID   levETIRAcetam  250 mg Oral Q T,Th,Sa-HD   levETIRAcetam  500 mg Oral QPC breakfast   sodium chloride flush  3 mL Intravenous Q12H   Continuous Infusions:  amiodarone     Followed by   amiodarone     ceFEPime (MAXIPIME) IV 1 g (02/05/23 0324)   [START ON 02/08/2023] vancomycin       LOS: 2 days    Time spent: 45 minutes spent on chart review, discussion with nursing staff, consultants, updating family and interview/physical exam; more than 50% of that time was spent in counseling and/or coordination of care.    Joseph Art, DO [facility] Available via Epic secure chat 7am-7pm After these hours, please refer to coverage provider  listed on [email or web address] 02/05/2023, 1:08 PM

## 2023-02-06 ENCOUNTER — Encounter (HOSPITAL_COMMUNITY): Payer: Self-pay | Admitting: Podiatry

## 2023-02-06 ENCOUNTER — Encounter (HOSPITAL_COMMUNITY): Payer: Medicare HMO

## 2023-02-06 DIAGNOSIS — L089 Local infection of the skin and subcutaneous tissue, unspecified: Secondary | ICD-10-CM | POA: Diagnosis not present

## 2023-02-06 DIAGNOSIS — E11628 Type 2 diabetes mellitus with other skin complications: Secondary | ICD-10-CM | POA: Diagnosis not present

## 2023-02-06 DIAGNOSIS — N186 End stage renal disease: Secondary | ICD-10-CM

## 2023-02-06 DIAGNOSIS — Z992 Dependence on renal dialysis: Secondary | ICD-10-CM | POA: Diagnosis not present

## 2023-02-06 MED ORDER — CHLORHEXIDINE GLUCONATE CLOTH 2 % EX PADS
6.0000 | MEDICATED_PAD | Freq: Every day | CUTANEOUS | Status: DC
  Administered 2023-02-07 – 2023-02-11 (×5): 6 via TOPICAL

## 2023-02-06 MED ORDER — HYDROCODONE-ACETAMINOPHEN 5-325 MG PO TABS
1.0000 | ORAL_TABLET | Freq: Four times a day (QID) | ORAL | Status: DC | PRN
  Administered 2023-02-06 – 2023-02-07 (×2): 1 via ORAL
  Administered 2023-02-09 – 2023-02-22 (×10): 2 via ORAL
  Filled 2023-02-06 (×4): qty 2
  Filled 2023-02-06: qty 1
  Filled 2023-02-06 (×3): qty 2
  Filled 2023-02-06: qty 1
  Filled 2023-02-06 (×2): qty 2
  Filled 2023-02-06: qty 1
  Filled 2023-02-06: qty 2

## 2023-02-06 NOTE — Progress Notes (Signed)
PODIATRY PROGRESS NOTE  NAME [name]. MRN [id number] DOB 18-Feb-1971 DOA 02/03/2023   Reason for consult:  Chief Complaint  Patient presents with   Foot Problem    right     History of present illness: 52 y.o. male POD # 2 s/p Right foot debridement, ray amputation and found to have extensive necrosis, loss of tissue to the plantar and lateral portion of the foot. Still having some pain. No fevers or chills. No acute changes.   Vitals:   02/06/23 0250 02/06/23 0741  BP: 111/62 (!) 100/55  Pulse: 68 (!) 54  Resp: 16 18  Temp:  99 F (37.2 C)  SpO2: 97% 99%     CBC    Component Value Date/Time   WBC 22.6 (H) 02/05/2023 0620   RBC 3.66 (L) 02/05/2023 0620   HGB 8.5 (L) 02/05/2023 0620   HCT 25.3 (L) 02/05/2023 0620   PLT 209 02/05/2023 0620   MCV 69.1 (L) 02/05/2023 0620   MCH 23.2 (L) 02/05/2023 0620   MCHC 33.6 02/05/2023 0620   RDW 19.9 (H) 02/05/2023 0620   LYMPHSABS 0.7 02/03/2023 1115   MONOABS 1.7 (H) 02/03/2023 1115   EOSABS 0.0 02/03/2023 1115   BASOSABS 0.0 02/03/2023 1115        Latest Ref Rng & Units 02/05/2023    6:20 AM 02/04/2023    3:31 AM 02/03/2023   11:15 AM  BMP  Glucose 70 - 99 mg/dL 90  99  82   BUN 6 - 20 mg/dL 65  295  284   Creatinine 0.61 - 1.24 mg/dL 13.24  40.10  27.25   Sodium 135 - 145 mmol/L 139  138  141   Potassium 3.5 - 5.1 mmol/L 3.2  4.6  4.4   Chloride 98 - 111 mmol/L 101  104  104   CO2 22 - 32 mmol/L 22  15  13    Calcium 8.9 - 10.3 mg/dL 8.9  8.8  9.6         Physical Exam: General: AAO x 3, NAD   Dermatology: Upon removal of the bandage there was was purulence noted on the bandage today. Large ulceration noted to the lateral aspect close picture below with antibiotic beads present.  There is still necrosis present plantarly on the foot.  There is no fluctuation or crepitation.  No crepitation in the leg.         Vascular: Foot appears to be warm and perfused at this time.  Neurological:  Decreased   Musculoskeletal Exam: There is pain to the foot but no pain in the leg.     ASSESSMENT/PLAN OF CARE  He is currently afebrile, Tmax 99. No labs today to review.  Dressing changed today.  Irrigated with saline.  Packed with a saline moistened 4 x 4 gauze followed by dressing.  Given the amount of soft tissue loss, infection is still think that a below-knee amputation is warranted at this time.  [name] has been consulted.  ABIs have been ordered.  Continue IV antibiotics for now.   Please contact me directly with any questions or concerns.     Ovid Curd, DPM [facility]  Dr. Lesia Sago. Chelsea Pedretti, DPM    [address]  [place], [place] [address]                Office [phone]  Fax [phone]

## 2023-02-06 NOTE — Progress Notes (Addendum)
[facility] Progress Note  Subjective: seen in room. No /co's.   Vitals:   02/06/23 0741 02/06/23 1126 02/06/23 1706 02/06/23 1730  BP: (!) 100/55 (!) 100/57 (!) 92/57 98/63  Pulse: (!) 54 (!) 58 62 60  Resp: 18 17 19 17   Temp: 99 F (37.2 C) 99.7 F (37.6 C) 99.3 F (37.4 C)   TempSrc: Oral Oral Oral   SpO2: 99% 97% 97% 97%  Weight:      Height:        Exam: Gen alert, no distress, chronically ill appearing Looks better No jvd or bruits Chest L clear, R dec'd at base RRR no RG Abd soft ntnd no mass or ascites +bs Ext no sig edema today, better Neuro is alert, Ox 3 , nf     LUA AVF+bruit       Renal-related home meds: - norvasc, coreg, hydralazine - phoslo 2 ac tid      OP HD: [facility]TS  3.5h  350/ 600   ? Dry wt   2/2.5 LUA AVF (from feb 2024, needs updating)     Assessment/ Plan: Diabetic foot infection - on IV abx, poss osteo per MRI. Per pmd/ podiatry. Will likely need R BKA.  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 Has missed dialysis w/ high Bun/Cr. Got HD last night. Labs/ volume stable. Next HD off schedule on Monday (cont MWF while here and switch back to TTS when dc'd).  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 Follow.  MBD ckd - CCa a bit high, phos in range. Hold any vdra. Cont binders w/ meals.      Vinson Moselle MD  [facility] 02/06/2023, 6:26 PM  Recent Labs  Lab 02/03/23 1115 02/04/23 0331 02/05/23 0620  HGB 8.9* 8.5* 8.5*  ALBUMIN 2.7*  --   --   CALCIUM 9.6 8.8* 8.9  PHOS  --   --  5.5*  CREATININE 18.57* 18.70* 10.81*  K 4.4 4.6 3.2*   No results for input(s): "IRON", "TIBC", "FERRITIN" in the last 168 hours. Inpatient medications:  amiodarone  200 mg Oral Daily   amphetamine-dextroamphetamine  5 mg Oral Daily   atorvastatin  80 mg Oral QHS   calcium acetate  1,334 mg Oral TID WC   carvedilol  12.5 mg Oral BID   Chlorhexidine Gluconate Cloth  6 each Topical Q0600    dorzolamide-timolol  1 drop Right Eye BID   levETIRAcetam  250 mg Oral Q T,Th,Sa-HD   levETIRAcetam  500 mg Oral QPC breakfast   sodium chloride flush  3 mL Intravenous Q12H    ceFEPime (MAXIPIME) IV 1 g (02/06/23 1751)   [START ON 02/08/2023] vancomycin     acetaminophen **OR** acetaminophen, camphor-menthol, HYDROcodone-acetaminophen

## 2023-02-06 NOTE — Plan of Care (Signed)
  Problem: Education: Goal: Knowledge of General Education information will improve Description: Including pain rating scale, medication(s)/side effects and non-pharmacologic comfort measures Outcome: Progressing   Problem: Health Behavior/Discharge Planning: Goal: Ability to manage health-related needs will improve Outcome: Progressing   Problem: Clinical Measurements: Goal: Ability to maintain clinical measurements within normal limits will improve Outcome: Progressing Goal: Will remain free from infection Outcome: Progressing Goal: Diagnostic test results will improve Outcome: Progressing   Problem: Activity: Goal: Risk for activity intolerance will decrease Outcome: Progressing   Problem: Pain Management: Goal: General experience of comfort will improve Outcome: Progressing   Problem: Safety: Goal: Ability to remain free from injury will improve Outcome: Progressing   Problem: Skin Integrity: Goal: Risk for impaired skin integrity will decrease Outcome: Progressing

## 2023-02-06 NOTE — Progress Notes (Signed)
PROGRESS NOTE    Keyvon Glori Luis.  ZOX:[id number] DOB: 11/15/1970 DOA: 02/03/2023 PCP: [name], FNP    Brief Narrative:   Dinnis Janowiak. is a 52 y.o. male with medical history significant of type 2 diabetes mellitus, acute osteomyelitis of toe of right foot with gangrene status post amputation of the great toe on the right foot, paroxysmal atrial fibrillation not on anticoagulation, hypertension, ESRD on HD, seizure disorder, BPH, diabetic polyneuropathy, GERD, gastroparesis, hypertension was sent to ED from podiatry office.  Per patient and his wife who is at the bedside, patient's wife noted a wound on his right foot on Sunday.  He called his podiatrist/Dr. Ardelle Anton and was given an appointment to see today.  Being seen in the podiatry office today, he was noted to have significant pus and infected wound, he was directed to the emergency department for further workup and admission.   Went to the OR on 12/13  with podiatry but now needs ABIs and possible BKA with [name].   Assessment and Plan: Diabetic infected right foot wound: Concern for osteomyelitis on MRI   -podiatry took to the OR on 12/13 12/14: per podiatry-  patient is going to need a BKA. There is loss of the plantar tissue and his WBC has not improved.  -IV abx -- cultures showing staph. Aureus  -consulted with [name] -ABIs ordered   Paroxysmal atrial fibrillation:  -went into a fib today with low BP but converted back -continue home amiodarone  - patient is not on any anticoagulation due to history of intraventricular hemorrhage and 10/2021.   Type 2 diabetes mellitus: His wife says that his diabetes has been "reversed" since he had some sort of surgery for his gastroparesis several years ago and his recent hemoglobin A1c was 4.2   History of CVA/hyperlipidemia:  - Continue atorvastatin.   Essential hypertension:  -BP low, hold norvasc so coreg can be adjust for better HR control   History of seizure:  Resume Keppra.   ESRD on HD: Nephrology consulted.   -HD 12/13 PM   CAD status post DES to LAD: Asymptomatic - not on any antiplatelets due to intraventricular hemorrhage history last year.  Hypokalemia -replete  DVT prophylaxis: Place and maintain sequential compression device Start: 02/05/23 1515    Code Status: Full Code Family Communication: at bedside  Disposition Plan:  Level of care: Progressive Status is: Inpatient Remains inpatient appropriate     Consultants:  Nephrology podiatry   Subjective: Did not sleep well  Objective: Vitals:   02/05/23 2109 02/05/23 2318 02/06/23 0250 02/06/23 0741  BP: (!) 102/55 106/60 111/62 (!) 100/55  Pulse: (!) 43 63 68 (!) 54  Resp: 15 20 16 18   Temp: 98.6 F (37 C) 98.3 F (36.8 C)  99 F (37.2 C)  TempSrc: Oral Oral Oral Oral  SpO2: 99% 98% 97% 99%  Weight:      Height:       No intake or output data in the 24 hours ending 02/06/23 1050  Filed Weights   02/03/23 1111 02/04/23 0321 02/04/23 1015  Weight: 82.6 kg 79.6 kg 79.4 kg    Examination:   General: Appearance:    frail male in no acute distress     Lungs:     respirations unlabored  Heart:    Bradycardic.   MS:   Amputation noted    Neurologic:   Awake, alert,flat       Data Reviewed: I have personally reviewed  following labs and imaging studies  CBC: Recent Labs  Lab 02/03/23 1115 02/04/23 0331 02/05/23 0620  WBC 22.3* 22.5* 22.6*  NEUTROABS 19.5*  --   --   HGB 8.9* 8.5* 8.5*  HCT 28.7* 26.4* 25.3*  MCV 73.4* 69.8* 69.1*  PLT 203 206 209   Basic Metabolic Panel: Recent Labs  Lab 02/03/23 1115 02/03/23 1120 02/04/23 0331 02/05/23 0620  NA 141  --  138 139  K 4.4  --  4.6 3.2*  CL 104  --  104 101  CO2 13*  --  15* 22  GLUCOSE 82  --  99 90  BUN 122*  --  132* 65*  CREATININE 18.57*  --  18.70* 10.81*  CALCIUM 9.6  --  8.8* 8.9  MG  --  2.4  --  1.8  PHOS  --   --   --  5.5*   GFR: Estimated Creatinine Clearance: 8.8  mL/min (A) (by C-G formula based on SCr of 10.81 mg/dL (H)). Liver Function Tests: Recent Labs  Lab 02/03/23 1115  AST 12*  ALT 10  ALKPHOS 65  BILITOT 0.9  PROT 6.9  ALBUMIN 2.7*   No results for input(s): "LIPASE", "AMYLASE" in the last 168 hours. No results for input(s): "AMMONIA" in the last 168 hours. Coagulation Profile: No results for input(s): "INR", "PROTIME" in the last 168 hours. Cardiac Enzymes: No results for input(s): "CKTOTAL", "CKMB", "CKMBINDEX", "TROPONINI" in the last 168 hours. BNP (last 3 results) No results for input(s): "PROBNP" in the last 8760 hours. HbA1C: No results for input(s): "HGBA1C" in the last 72 hours. CBG: Recent Labs  Lab 02/03/23 1117 02/04/23 1014 02/04/23 1242 02/04/23 1702  GLUCAP 84 97 86 97   Lipid Profile: No results for input(s): "CHOL", "HDL", "LDLCALC", "TRIG", "CHOLHDL", "LDLDIRECT" in the last 72 hours. Thyroid Function Tests: No results for input(s): "TSH", "T4TOTAL", "FREET4", "T3FREE", "THYROIDAB" in the last 72 hours. Anemia Panel: No results for input(s): "VITAMINB12", "FOLATE", "FERRITIN", "TIBC", "IRON", "RETICCTPCT" in the last 72 hours. Sepsis Labs: Recent Labs  Lab 02/03/23 1256  LATICACIDVEN 1.1    Recent Results (from the past 240 hours)  Culture, blood (routine x 2)     Status: None (Preliminary result)   Collection Time: 02/03/23 12:22 PM   Specimen: BLOOD RIGHT ARM  Result Value Ref Range Status   Specimen Description BLOOD RIGHT ARM  Final   Special Requests   Final    BOTTLES DRAWN AEROBIC AND ANAEROBIC Blood Culture results may not be optimal due to an inadequate volume of blood received in culture bottles   Culture   Final    NO GROWTH 3 DAYS Performed at [facility], [address] [place], [place] [address]    Report Status PENDING  Incomplete  Culture, blood (routine x 2)     Status: None (Preliminary result)   Collection Time: 02/03/23 12:27 PM   Specimen: BLOOD RIGHT HAND   Result Value Ref Range Status   Specimen Description BLOOD RIGHT HAND  Final   Special Requests   Final    BOTTLES DRAWN AEROBIC AND ANAEROBIC Blood Culture results may not be optimal due to an inadequate volume of blood received in culture bottles   Culture   Final    NO GROWTH 3 DAYS Performed at [facility], [address] [place], [place] [address]    Report Status PENDING  Incomplete  Aerobic/Anaerobic Culture w Gram Stain (surgical/deep wound)  Status: None (Preliminary result)   Collection Time: 02/04/23 12:06 PM   Specimen: Foot, Right; Tissue  Result Value Ref Range Status   Specimen Description TISSUE  Final   Special Requests RIGHT FOOT  Final   Gram Stain   Final    FEW WBC PRESENT, PREDOMINANTLY PMN ABUNDANT GRAM POSITIVE COCCI IN PAIRS RARE GRAM NEGATIVE COCCOBACILLI    Culture   Final    ABUNDANT STAPHYLOCOCCUS AUREUS CULTURE REINCUBATED FOR BETTER GROWTH Performed at [facility], [address] [place], [place] [address]    Report Status PENDING  Incomplete  Aerobic/Anaerobic Culture w Gram Stain (surgical/deep wound)     Status: None (Preliminary result)   Collection Time: 02/04/23 12:06 PM   Specimen: Foot, Right; Wound  Result Value Ref Range Status   Specimen Description WOUND  Final   Special Requests RIGHT FOOT  Final   Gram Stain   Final    MODERATE WBC PRESENT, PREDOMINANTLY PMN MODERATE GRAM POSITIVE COCCI    Culture   Final    ABUNDANT STAPHYLOCOCCUS AUREUS CULTURE REINCUBATED FOR BETTER GROWTH Performed at [facility], [address], [place], [place] [address]    Report Status PENDING  Incomplete         Radiology Studies: DG Foot 2 Views Right Result Date: 02/04/2023 CLINICAL DATA:  Postoperative state.  Diabetic foot ulcer. EXAM: RIGHT FOOT - 2 VIEW COMPARISON:  Right foot radiographs 02/03/2023, MRI left forefoot FINDINGS: Interval revision amputation of the fifth digit, now to the proximal shaft of fifth  metatarsal. New amputation of the fourth digit to the proximal shaft of the metatarsal. There are antibiotic beads overlying the prominently plantar aspect of the second through fourth metatarsals. There is plantar midfoot soft tissue irregularity with overlying bandage material. High-grade vascular calcifications. IMPRESSION: 1. Interval revision amputation of the fifth digit, now to the proximal shaft of the fifth metatarsal. 2. New amputation of the fourth digit to the proximal shaft of the metatarsal. 3. Antibiotic beads overlying the prominently plantar aspect of the second through fourth metatarsals. 4. No unexpected postsurgical findings. Electronically Signed   By: Neita Garnet M.D.   On: 02/04/2023 15:27        Scheduled Meds:  amiodarone  200 mg Oral Daily   amphetamine-dextroamphetamine  5 mg Oral Daily   atorvastatin  80 mg Oral QHS   calcium acetate  1,334 mg Oral TID WC   carvedilol  12.5 mg Oral BID   Chlorhexidine Gluconate Cloth  6 each Topical Q0600   dorzolamide-timolol  1 drop Right Eye BID   levETIRAcetam  250 mg Oral Q T,Th,Sa-HD   levETIRAcetam  500 mg Oral QPC breakfast   sodium chloride flush  3 mL Intravenous Q12H   Continuous Infusions:  ceFEPime (MAXIPIME) IV 1 g (02/05/23 1733)   [START ON 02/08/2023] vancomycin       LOS: 3 days    Time spent: 45 minutes spent on chart review, discussion with nursing staff, consultants, updating family and interview/physical exam; more than 50% of that time was spent in counseling and/or coordination of care.    Joseph Art, DO [facility] Available via Epic secure chat 7am-7pm After these hours, please refer to coverage provider listed on [email or web address] 02/06/2023, 10:50 AM

## 2023-02-07 ENCOUNTER — Encounter (HOSPITAL_COMMUNITY): Payer: Self-pay | Admitting: Family Medicine

## 2023-02-07 ENCOUNTER — Inpatient Hospital Stay (HOSPITAL_COMMUNITY): Payer: Medicare HMO

## 2023-02-07 DIAGNOSIS — I10 Essential (primary) hypertension: Secondary | ICD-10-CM | POA: Diagnosis not present

## 2023-02-07 DIAGNOSIS — I96 Gangrene, not elsewhere classified: Secondary | ICD-10-CM | POA: Diagnosis not present

## 2023-02-07 DIAGNOSIS — Z794 Long term (current) use of insulin: Secondary | ICD-10-CM

## 2023-02-07 DIAGNOSIS — D631 Anemia in chronic kidney disease: Secondary | ICD-10-CM

## 2023-02-07 DIAGNOSIS — E1165 Type 2 diabetes mellitus with hyperglycemia: Secondary | ICD-10-CM

## 2023-02-07 DIAGNOSIS — I739 Peripheral vascular disease, unspecified: Secondary | ICD-10-CM

## 2023-02-07 DIAGNOSIS — N186 End stage renal disease: Secondary | ICD-10-CM | POA: Diagnosis not present

## 2023-02-07 LAB — BASIC METABOLIC PANEL
Anion gap: 15 (ref 5–15)
BUN: 105 mg/dL — ABNORMAL HIGH (ref 6–20)
CO2: 19 mmol/L — ABNORMAL LOW (ref 22–32)
Calcium: 8.7 mg/dL — ABNORMAL LOW (ref 8.9–10.3)
Chloride: 101 mmol/L (ref 98–111)
Creatinine, Ser: 14.3 mg/dL — ABNORMAL HIGH (ref 0.61–1.24)
GFR, Estimated: 4 mL/min — ABNORMAL LOW (ref >60)
Glucose, Bld: 103 mg/dL — ABNORMAL HIGH (ref 70–99)
Potassium: 4 mmol/L (ref 3.5–5.1)
Sodium: 135 mmol/L (ref 135–145)

## 2023-02-07 LAB — CBC
HCT: 25.4 % — ABNORMAL LOW (ref 39.0–52.0)
Hemoglobin: 8.1 g/dL — ABNORMAL LOW (ref 13.0–17.0)
MCH: 22.6 pg — ABNORMAL LOW (ref 26.0–34.0)
MCHC: 31.9 g/dL (ref 30.0–36.0)
MCV: 70.8 fL — ABNORMAL LOW (ref 80.0–100.0)
Platelets: 224 10*3/uL (ref 150–400)
RBC: 3.59 MIL/uL — ABNORMAL LOW (ref 4.22–5.81)
RDW: 20.6 % — ABNORMAL HIGH (ref 11.5–15.5)
WBC: 17.9 10*3/uL — ABNORMAL HIGH (ref 4.0–10.5)
nRBC: 0 % (ref 0.0–0.2)

## 2023-02-07 LAB — SURGICAL PATHOLOGY

## 2023-02-07 LAB — GLUCOSE, CAPILLARY
Glucose-Capillary: 68 mg/dL — ABNORMAL LOW (ref 70–99)
Glucose-Capillary: 141 mg/dL — ABNORMAL HIGH (ref 70–99)
Glucose-Capillary: 90 mg/dL (ref 70–99)
Glucose-Capillary: 113 mg/dL — ABNORMAL HIGH (ref 70–99)

## 2023-02-07 MED ORDER — HEPARIN SODIUM (PORCINE) 1000 UNIT/ML DIALYSIS
1000.0000 [IU] | INTRAMUSCULAR | Status: DC | PRN
  Administered 2023-02-07: 2500 [IU]

## 2023-02-07 MED ORDER — HEPARIN SODIUM (PORCINE) 1000 UNIT/ML DIALYSIS
2500.0000 [IU] | Freq: Once | INTRAMUSCULAR | Status: DC
  Filled 2023-02-07: qty 3

## 2023-02-07 MED ORDER — NYSTATIN 100000 UNIT/GM EX POWD
Freq: Three times a day (TID) | CUTANEOUS | Status: DC
  Administered 2023-02-13 (×2): 1 via TOPICAL
  Filled 2023-02-07 (×2): qty 15

## 2023-02-07 MED ORDER — LEVETIRACETAM IN NACL 500 MG/100ML IV SOLN
500.0000 mg | Freq: Every day | INTRAVENOUS | Status: DC
  Administered 2023-02-07 – 2023-02-11 (×4): 500 mg via INTRAVENOUS
  Filled 2023-02-07 (×5): qty 100

## 2023-02-07 MED ORDER — HEPARIN SODIUM (PORCINE) 1000 UNIT/ML DIALYSIS
2500.0000 [IU] | INTRAMUSCULAR | Status: DC | PRN
Start: 2023-02-08 — End: 2023-02-07
  Filled 2023-02-07: qty 3

## 2023-02-07 MED ORDER — VANCOMYCIN HCL 750 MG/150ML IV SOLN
750.0000 mg | INTRAVENOUS | Status: AC
  Administered 2023-02-07: 750 mg via INTRAVENOUS
  Filled 2023-02-07 (×2): qty 150

## 2023-02-07 MED ORDER — LIDOCAINE-PRILOCAINE 2.5-2.5 % EX CREA: 1.0000 | TOPICAL_CREAM | CUTANEOUS | Status: DC | PRN

## 2023-02-07 MED ORDER — DEXTROSE 50 % IV SOLN
12.5000 g | INTRAVENOUS | Status: AC
  Administered 2023-02-07: 12.5 g via INTRAVENOUS

## 2023-02-07 MED ORDER — ANTICOAGULANT SODIUM CITRATE 4% (200MG/5ML) IV SOLN: 5.0000 mL | Status: DC | PRN

## 2023-02-07 MED ORDER — LIDOCAINE HCL (PF) 1 % IJ SOLN: 5.0000 mL | INTRAMUSCULAR | Status: DC | PRN

## 2023-02-07 MED ORDER — NEPRO/CARBSTEADY PO LIQD: 237.0000 mL | ORAL | Status: DC | PRN

## 2023-02-07 MED ORDER — ALTEPLASE 2 MG IJ SOLR: 2.0000 mg | Freq: Once | INTRAMUSCULAR | Status: DC | PRN

## 2023-02-07 MED ORDER — LIDOCAINE HCL URETHRAL/MUCOSAL 2 % EX GEL
1.0000 | Freq: Four times a day (QID) | CUTANEOUS | Status: DC | PRN
Start: 2023-02-07 — End: 2023-02-23

## 2023-02-07 MED ORDER — DEXTROSE 50 % IV SOLN
INTRAVENOUS | Status: AC
  Filled 2023-02-07: qty 50

## 2023-02-07 MED ORDER — SODIUM CHLORIDE 0.9 % IV SOLN
250.0000 mg | INTRAVENOUS | Status: DC
  Administered 2023-02-09: 250 mg via INTRAVENOUS
  Filled 2023-02-07 (×2): qty 2.5

## 2023-02-07 MED ORDER — DEXTROSE IN LACTATED RINGERS 5 % IV SOLN: INTRAVENOUS | Status: DC

## 2023-02-07 MED ORDER — OXYCODONE HCL 5 MG PO TABS
5.0000 mg | ORAL_TABLET | ORAL | Status: DC | PRN
  Administered 2023-02-08 – 2023-02-22 (×11): 5 mg via ORAL
  Filled 2023-02-07 (×14): qty 1

## 2023-02-07 MED ORDER — PENTAFLUOROPROP-TETRAFLUOROETH EX AERO: 1.0000 | INHALATION_SPRAY | CUTANEOUS | Status: DC | PRN

## 2023-02-07 MED ORDER — HEPARIN SODIUM (PORCINE) 1000 UNIT/ML IJ SOLN
2500.0000 [IU] | Freq: Once | INTRAMUSCULAR | Status: AC
  Administered 2023-02-07: 2500 [IU] via INTRAVENOUS

## 2023-02-07 NOTE — Progress Notes (Signed)
Family attempted to feed pt, this RN found pt w/ a severe, strong congested cough

## 2023-02-07 NOTE — Progress Notes (Signed)
Vascular surgery update:  Patient hypertensive in HD today and was having a mental status change with minimal responsiveness after HD session.  Will cancel angiogram for today and reschedule for tomorrow in the cath lab.  Carolynn Sayers, MD

## 2023-02-07 NOTE — Hospital Course (Addendum)
53 y.o. M with DM, HTN, ESRD on HD, pAF not on AC, gastroparesis, and hx foot ulcer right foot who presented from podiatry clinic for worsening ulcer, found to have osteomyelitis, now s/p BKA.      Assessment and Plan: * Gangrene of right foot (HCC) S/P BKA (below knee amputation), right (HCC) - 02-09-2023 -Postop care per orthopedics   Diabetic hypoglycemia (HCC) Resolved   PAD (peripheral artery disease) (HCC) -Continue low-dose aspirin, Lipitor   History of seizure No seizures observed - Continue Keppra   History of CVA (cerebrovascular accident) - with ICH while on plavix and Eliquis in 11-2021. 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 Not on systemic anticoagulation due to history of bleeding - Continue amiodarone, carvedilol   Uncontrolled type 2 diabetes mellitus with hyperglycemia, with long-term current use of insulin (HCC) Glucoses improved - Holding insulin     Essential hypertension, benign Blood pressure normal - Continue carvedilol   ADD - Continue Adderall                   Subjective: No new concerns.  No nursing concerns.         Physical Exam:  Adult male, lying in bed, appears weak and tired RRR, no murmurs, no peripheral edema Respiratory rate normal, lungs clear without rales or wheezes Abdomen soft without tenderness palpation or guarding Oriented to person, place, and situation.  Psychomotor slowing is mild.  Generalized weakness.     Data Reviewed:

## 2023-02-07 NOTE — Progress Notes (Signed)
[facility] Progress Note  Subjective: seen in HD, did not tolerated efforts to UF anything at HD this am. Did not get any fluid off.   Vitals:   02/07/23 1153 02/07/23 1158 02/07/23 1200 02/07/23 1400  BP: (!) 91/48 (!) 94/51 108/79 (!) 113/32  Pulse: (!) 118 (!) 108 (!) 111 67  Resp: 12 11 13 19   Temp: 97.8 F (36.6 C)   97.8 F (36.6 C)  TempSrc: Oral   Oral  SpO2: 99% 99% 97% 100%  Weight:   79.3 kg   Height:        Exam: Gen alert, no distress, chronically ill appearing Looks better No jvd or bruits Chest L clear, R dec'd at base RRR no RG Abd soft ntnd no mass or ascites +bs Ext no sig edema today, better Neuro is alert, Ox 3 , nf     LUA AVF+bruit       Renal-related home meds: - norvasc, coreg, hydralazine - phoslo 2 ac tid      OP HD: [facility]  3.5h  350/ 600   ? Dry wt   2/2.5 LUA AVF (from feb 2024, needs updating)     Assessment/ Plan: Diabetic foot infection - on IV abx, poss osteo per MRI. Per pmd/ podiatry. Will likely need R BKA.  ESKD - HD today (see below). Next HD Wednesday.  HD schedule: he is usually TTS. Plan is to cont MWF while here and switch back to TTS upon dc.  HTN - bp's good, cont home meds Volume - no vol to get off w/ HD today. As above.  Anemia of eskd - Hb 8- 9 here. Follow.  MBD ckd - CCa a bit high, phos in range. Hold any vdra. Cont binders w/ meals.      Vinson Moselle MD  [facility] 02/07/2023, 3:43 PM  Recent Labs  Lab 02/03/23 1115 02/04/23 0331 02/05/23 0620 02/07/23 0318  HGB 8.9*   < > 8.5* 8.1*  ALBUMIN 2.7*  --   --   --   CALCIUM 9.6   < > 8.9 8.7*  PHOS  --   --  5.5*  --   CREATININE 18.57*   < > 10.81* 14.30*  K 4.4   < > 3.2* 4.0   < > = values in this interval not displayed.   No results for input(s): "IRON", "TIBC", "FERRITIN" in the last 168 hours. Inpatient medications:  amiodarone  200 mg Oral Daily   amphetamine-dextroamphetamine  5 mg Oral Daily   atorvastatin  80 mg  Oral QHS   calcium acetate  1,334 mg Oral TID WC   carvedilol  12.5 mg Oral BID   Chlorhexidine Gluconate Cloth  6 each Topical Q0600   Chlorhexidine Gluconate Cloth  6 each Topical Q0600   dorzolamide-timolol  1 drop Right Eye BID   sodium chloride flush  3 mL Intravenous Q12H    ceFEPime (MAXIPIME) IV 1 g (02/06/23 1751)   [START ON 02/09/2023] levETIRAcetam     levETIRAcetam 500 mg (02/07/23 1454)   vancomycin     acetaminophen **OR** acetaminophen, camphor-menthol, HYDROcodone-acetaminophen, oxyCODONE

## 2023-02-07 NOTE — Consult Note (Signed)
ORTHOPAEDIC CONSULTATION  REQUESTING PHYSICIAN: Carollee Herter, DO  Chief Complaint: Necrotic gangrenous right foot with ischemic changes to the left heel.  HPI: [name]. is a 52 y.o. male who presents with type 2 diabetes with end-stage renal disease on dialysis with extensive gangrenous changes to the right foot with mild ischemic changes to the left heel.  Patient states they are both painful.  Patient is status post fourth and fifth ray amputations with podiatry with progressive ischemic changes.  Past Medical History:  Diagnosis Date   Acute bilateral cerebral infarction in a watershed distribution [facility]) 04/21/2021   a.) MRI brain 04/21/2021 --> patchy ACUTE cortically-based infarcts within the bilateral high frontal lobes and right parietal lobe   Acute cerebral infarction (HCC) 02/13/2021   a.) MRI brain 02/13/2021: acute LEFT hippocampal infarct   Acute cerebral infarction (HCC) 04/21/2021   a.) MRI brain 04/21/2021: subcentimeter ACUTE infarct within the callosal splenium on the LEFT   Anemia of chronic renal failure    Anxiety    Aortic dilatation (HCC) 11/25/2020   a.) TTE 11/25/2020: Ao root measured 38 mm. b.) TTE 04/22/2021: Ao root measured 44 mm; c. 11/2021 Echo: Ao root 40mm.   Atrial fibrillation (HCC)    a.) CHA2DS2-VASc = 6 (CHF, HTN, CVA x2, prior MI, T2DM). b.) rate/rhythm maintained on oral amiodarone + carvedilol; chronic OAC/AP therapy discontinued following ICH   BPH (benign prostatic hyperplasia)    Cardiac arrest (HCC) 07/26/2021   a.) in setting of hyperkalemia, NSTEMI, and seizure following missed HD; pulseless and apneic --> required 1 round of CPR prior to ROSC   Cellulitis    Cerebral hemorrhage (HCC) 11/21/2021   a.) CT head 11/21/2021 --> 1.4 x 1.5 x 3.1 cm ACUTE hemorrhage within the inferior aspect of the fourth ventricle, and extending inferiorly through the foramen of foramen of [place] --> occurred in setting of HTN emergency and  DOAC/APT-->eliquis/plavix d/c'd.   Cerebral microvascular disease    Chronic diarrhea    Chronic heart failure with preserved ejection fraction (HFpEF) (HCC) 07/19/2017   a.) 06/2017 Echo: EF 75%; b.) 11/2020 Echo: EF 55-60%; c.) 04/2021 Echo: EF 55-60%; d.) 11/2021 Echo: EF 65-70%, GrII DD, nl RV fxn, sev dil LA, large circumferential pericardial eff w/o tamponade, triv MR, AoV sclerosis. Ao root 40mm; e.) TTE 02/28/2025: EF 60-65%, sev LVH, mod posterior LV effusion, AoV sclerosis   Chronic pain of both ankles    Coronary artery disease 02/26/2020   a. 02/2020 Ant STEMI/PCI: LM nl, LAD 33m (3.5x30 Resolute Onyx), RI nl, LCX min irregs, RCA min irregs. EF 65%.   DDD (degenerative disc disease), cervical    Diabetes mellitus, type II (HCC)    Diabetic neuropathy (HCC)    Esophagitis    ESRD (end stage renal disease) on dialysis (HCC)    a.) [facility]; T-Th-Sat   Frequent falls    Gait instability    Gastritis    Gastroparesis    GERD (gastroesophageal reflux disease)    H/O enucleation of left eyeball    Heart palpitations    History of kidney stones    HLD (hyperlipidemia)    Hypertension    Insomnia    a.) uses melatonin PRN   Long term current use of amiodarone    Nausea and vomiting in adult    recurrent   NSTEMI (non-ST elevated myocardial infarction) (HCC) 07/26/2021   a.) hyperkalemic from missed HD; seizures; decompensated to cardiac arrest and required CPR x 1 round  prior to ROSC.   Osteomyelitis (HCC)    Pericardial effusion    a. 11/2021 Echo: EF 65-70%, GrII DD, nl RV fxn, sev dil LA, large circumferential pericardial eff w/o tamponade, triv MR, AoV sclerosis. Ao root 40mm.   Seizure (HCC)    a.) last 07/26/2021 in setting of missed HD --> hyperkalemic at 6.5 --> pulseless/apneic and required CPR; discharged home on levetiracetam.   ST elevation myocardial infarction (STEMI) of anterior wall (HCC) 02/26/2020   a.) LHC/PCI 02/26/2020 --> EF 65%; LVEDP 11 mmHg; 90% mLAD (3.5  x 20 mm Resolute Onyx DES x 1)   Past Surgical History:  Procedure Laterality Date   A/V FISTULAGRAM Left 08/31/2021   Procedure: A/V Fistulagram;  Surgeon: Annice Needy, MD;  Location: [name] INVASIVE CV LAB;  Service: Cardiovascular;  Laterality: Left;   A/V FISTULAGRAM Left 01/25/2022   Procedure: A/V Fistulagram;  Surgeon: Annice Needy, MD;  Location: [name] IN[facility];  Service: Cardiovascular;  Laterality: Left;   A/V FISTULAGRAM Right 10/18/2022   Procedure: A/V Fistulagram;  Surgeon: Annice Needy, MD;  Location: [name];  Service: Cardiovascular;  Laterality: Right;   AMPUTATION Left 03/16/2016   Procedure: AMPUTATION DIGIT LEFT HALLUX;  Surgeon: Vivi Barrack, DPM;  Location: [facility] OR;  Service: Podiatry;  Laterality: Left;  can start around 5    AMPUTATION Right 02/09/2021   Procedure: AMPUTATION DIGIT;  Surgeon: Marlyne Beards, MD;  Location: [facility] OR;  Service: Orthopedics;  Laterality: Right;   AMPUTATION Right 02/04/2023   Procedure: AMPUTATION 4TH AND 5TH METATARSAL;  Surgeon: Pilar Plate, DPM;  Location: [facility] OR;  Service: Orthopedics/Podiatry;  Laterality: Right;  I&D, partial ray resection, abx beads   AMPUTATION TOE Right 02/28/2022   Procedure: AMPUTATION TOE;  Surgeon: Louann Sjogren, DPM;  Location: [name] ORS;  Service: Podiatry;  Laterality: Right;  right fifth toe   ARTHROSCOPIC REPAIR ACL Left    AV FISTULA PLACEMENT Right 05/31/2019   Procedure: Brachiocephalic AV fistula creation;  Surgeon: Annice Needy, MD;  Location: [name] ORS;  Service: Vascular;  Laterality: Right;   AV FISTULA PLACEMENT Left 08/13/2021   Procedure: ARTERIOVENOUS (AV) FISTULA CREATION (BRACHIALCEPHALIC);  Surgeon: Annice Needy, MD;  Location: [name] ORS;  Service: Vascular;  Laterality: Left;   COLONOSCOPY WITH PROPOFOL N/A 10/28/2015   Procedure: COLONOSCOPY WITH PROPOFOL;  Surgeon: Christena Deem, MD;  Location: [facility] ENDOSCOPY;  Service: Endoscopy;  Laterality: N/A;    COLONOSCOPY WITH PROPOFOL N/A 10/29/2015   Procedure: COLONOSCOPY WITH PROPOFOL;  Surgeon: Christena Deem, MD;  Location: [facility] ENDOSCOPY;  Service: Endoscopy;  Laterality: N/A;   COLONOSCOPY WITH PROPOFOL N/A 01/27/2023   Procedure: COLONOSCOPY WITH PROPOFOL;  Surgeon: Franky Macho, MD;  Location: [facility];  Service: Endoscopy;  Laterality: N/A;  12:30pm, asa 3   CORONARY ULTRASOUND/IVUS N/A 02/26/2020   Procedure: Intravascular Ultrasound/IVUS;  Surgeon: Corky Crafts, MD;  Location: [facility] INVASIVE CV LAB;  Service: Cardiovascular;  Laterality: N/A;   CORONARY/GRAFT ACUTE MI REVASCULARIZATION N/A 02/26/2020   Procedure: Coronary/Graft Acute MI Revascularization;  Surgeon: Corky Crafts, MD;  Location: [facility] INVASIVE CV LAB;  Service: Cardiovascular;  Laterality: N/A;   DIALYSIS/PERMA CATHETER INSERTION Right 04/26/2019   Perm Cath    DIALYSIS/PERMA CATHETER INSERTION N/A 04/26/2019   Procedure: DIALYSIS/PERMA CATHETER INSERTION;  Surgeon: Annice Needy, MD;  Location: [name];  Service: Cardiovascular;  Laterality: N/A;   DIALYSIS/PERMA CATHETER INSERTION N/A 05/25/2021  Procedure: DIALYSIS/PERMA CATHETER INSERTION;  Surgeon: Annice Needy, MD;  Location: [name];  Service: Cardiovascular;  Laterality: N/A;   DIALYSIS/PERMA CATHETER INSERTION N/A 08/31/2021   Procedure: DIALYSIS/PERMA CATHETER INSERTION;  Surgeon: Annice Needy, MD;  Location: [name] INV[facility];  Service: Cardiovascular;  Laterality: N/A;   DIALYSIS/PERMA CATHETER REMOVAL N/A 08/15/2019   Procedure: DIALYSIS/PERMA CATHETER REMOVAL;  Surgeon: Renford Dills, MD;  Location: [name] INV[facility];  Service: Cardiovascular;  Laterality: N/A;   DIALYSIS/PERMA CATHETER REMOVAL N/A 03/15/2022   Procedure: DIALYSIS/PERMA CATHETER REMOVAL;  Surgeon: Annice Needy, MD;  Location: [name];  Service: Cardiovascular;  Laterality: N/A;   EMBOLIZATION (CATH LAB) Right 02/06/2021    Procedure: EMBOLIZATION;  Surgeon: Annice Needy, MD;  Location: [name] INV[facility] CV [facility];  Service: Cardiovascular;  Laterality: Right;  Right Upper Extremity Dialysis Access, Permcath Placement   ESOPHAGOGASTRODUODENOSCOPY (EGD) WITH PROPOFOL N/A 12/27/2017   Procedure: ESOPHAGOGASTRODUODENOSCOPY (EGD) WITH PROPOFOL;  Surgeon: Toledo, Boykin Nearing, MD;  Location: [name] ENDOSCOPY;  Service: Gastroenterology;  Laterality: N/A;   EYE SURGERY     LEFT HEART CATH AND CORONARY ANGIOGRAPHY N/A 02/26/2020   Procedure: LEFT HEART CATH AND CORONARY ANGIOGRAPHY;  Surgeon: Corky Crafts, MD;  Location: [facility] INVASIVE CV LAB;  Service: Cardiovascular;  Laterality: N/A;   PROSTATE SURGERY  2016   REVISON OF ARTERIOVENOUS FISTULA Right 07/22/2022   Procedure: RESECTION OF ANEURYSMAL RIGHT ARM ARTERIOVENOUS FISTULA;  Surgeon: Annice Needy, MD;  Location: [name] ORS;  Service: Vascular;  Laterality: Right;   TONSILECTOMY/ADENOIDECTOMY WITH MYRINGOTOMY     TONSILLECTOMY     UPPER EXTREMITY ANGIOGRAPHY Right 11/26/2020   Procedure: Upper Extremity Angiography;  Surgeon: Annice Needy, MD;  Location: [name];  Service: Cardiovascular;  Laterality: Right;   UPPER EXTREMITY ANGIOGRAPHY Right 02/05/2021   Procedure: UPPER EXTREMITY ANGIOGRAPHY;  Surgeon: Annice Needy, MD;  Location: [name] INV[facility] CV LAB;  Service: Cardiovascular;  Laterality: Right;   Social History   Socioeconomic History   Marital status: Married    Spouse name: Martie Lee    Number of children: 2   Years of education: Not on file   Highest education level: High school graduate  Occupational History   Occupation: Disability    Comment: not employed  Tobacco Use   Smoking status: Never   Smokeless tobacco: Never  Vaping Use   Vaping status: Never Used  Substance and Sexual Activity   Alcohol use: No   Drug use: No   Sexual activity: Yes  Other Topics Concern   Not on file  Social History Narrative   Oldest son killed in car crash  June 2020.  Lives in [place] with his wife.   Social Drivers of Health   Financial Resource Strain: High Risk (07/05/2017)   Overall Financial Resource Strain (CARDIA)    Difficulty of Paying Living Expenses: Hard  Food Insecurity: Patient Declined (02/04/2023)   Hunger Vital Sign    Worried About Running Out of Food in the Last Year: Patient declined    Ran Out of Food in the Last Year: Patient declined  Transportation Needs: Patient Declined (02/04/2023)   PRAPARE - Administrator, Civil Service (Medical): Patient declined    Lack of Transportation (Non-Medical): Patient declined  Physical Activity: Inactive (07/05/2017)   Exercise Vital Sign    Days of Exercise per Week: 0 days    Minutes of Exercise per Session: 0 min  Stress:  Stress Concern Present (07/05/2017)   [employer] of Occupational Health - Occupational Stress Questionnaire    Feeling of Stress : Rather much  Social Connections: Moderately Integrated (07/05/2017)   Social Connection and Isolation Panel [NHANES]    Frequency of Communication with Friends and Family: Three times a week    Frequency of Social Gatherings with Friends and Family: Once a week    Attends Religious Services: More than 4 times per year    Active Member of [employer] or Organizations: No    Attends Engineer, structural: Never    Marital Status: Married   Family History  Problem Relation Age of Onset   CAD Father    Stroke Father    Diabetes Mellitus II Mother    Kidney failure Mother    Schizophrenia Mother    - negative except otherwise stated in the family history section Allergies  Allergen Reactions   Promethazine Diarrhea and Other (See Comments)    Muscle cramps   Promethazine Hcl Other (See Comments)    Cramping all over  Other reaction(s): cramp all over   Other     Other reaction(s): Unknown Other reaction(s): Unknown   Prior to Admission medications   Medication Sig Start Date End Date Taking?  Authorizing Provider  acetaminophen (TYLENOL) 325 MG tablet Take 1-2 tablets (325-650 mg total) by mouth every 4 (four) hours as needed for mild pain. 05/04/21  Yes Love, Evlyn Kanner, PA-C  amiodarone (PACERONE) 200 MG tablet Take 1 tablet (200 mg total) by mouth every morning. 08/02/22 08/03/23 Yes Corky Crafts, MD  amLODipine (NORVASC) 5 MG tablet Take 5 mg by mouth daily.   Yes [provider]  ammonium lactate (AMLACTIN) 12 % cream Apply 1 Application topically as needed for dry skin. 05/25/22  Yes Vivi Barrack, DPM  atorvastatin (LIPITOR) 80 MG tablet TAKE ONE TABLET BY MOUTH AT BEDTIME. 05/07/22  Yes Corky Crafts, MD  calcium acetate (PHOSLO) 667 MG capsule Take 2 capsules (1,334 mg total) by mouth 3 (three) times daily with meals. 05/04/21  Yes Love, Evlyn Kanner, PA-C  carvedilol (COREG) 25 MG tablet Take 25 mg by mouth 2 (two) times daily. 06/16/21  Yes [provider]  dorzolamide-timolol (COSOPT) 2-0.5 % ophthalmic solution Place 1 drop into the right eye 2 (two) times daily. 11/15/22 11/15/23 Yes [provider]  levETIRAcetam (KEPPRA) 250 MG tablet Take 1 tablet (250 mg total) by mouth Every Tuesday,Thursday,and Saturday with dialysis. 04/08/22  Yes Johnson, Clanford L, MD  levETIRAcetam (KEPPRA) 500 MG tablet Take 1 tablet (500 mg total) by mouth daily after breakfast. 04/08/22  Yes Johnson, Clanford L, MD  lidocaine-prilocaine (EMLA) cream Apply topically. 12/24/21  Yes [provider]  amphetamine-dextroamphetamine (ADDERALL XR) 5 MG 24 hr capsule Take 5 mg by mouth daily.    [provider]  fluticasone (FLONASE) 50 MCG/ACT nasal spray Place 2 sprays into both nostrils daily. 10/05/22   Leath-Warren, Sadie Haber, NP  hydrALAZINE (APRESOLINE) 25 MG tablet Take 25 mg by mouth 3 (three) times daily. Patient not taking: Reported on 02/03/2023 03/30/22   [provider]  hydrocortisone (ANUSOL-HC) 2.5 % rectal cream Place 1 Application  rectally 2 (two) times daily. Patient not taking: Reported on 02/03/2023 10/05/22   Leath-Warren, Sadie Haber, NP  nitroGLYCERIN (NITROSTAT) 0.4 MG SL tablet Place 1 tablet (0.4 mg total) under the tongue every 5 (five) minutes as needed for chest pain. 03/24/22   Corky Crafts, MD  polyethylene glycol (MIRALAX / GLYCOLAX) 17 g packet Take 17 g by mouth 2 (two) times daily. Patient taking differently: Take 17 g by mouth 2 (two) times daily. New medication from Dr. Burgess Estelle, have to pick up at Pharmacy. 01/27/23 04/27/23  Franky Macho, MD  psyllium (METAMUCIL) 58.6 % packet Take 1 packet by mouth 2 (two) times daily. Patient taking differently: Take 1 packet by mouth 2 (two) times daily. New medication from Dr. Burgess Estelle, have to pick up at Pharmacy. 01/27/23 04/27/23  Franky Macho, MD   No results found. - pertinent xrays, CT, MRI studies were reviewed and independently interpreted  Positive ROS: All other systems have been reviewed and were otherwise negative with the exception of those mentioned in the HPI and as above.  Physical Exam: General: Alert, no acute distress Psychiatric: Patient is competent for consent with normal mood and affect Lymphatic: No axillary or cervical lymphadenopathy Cardiovascular: No pedal edema Respiratory: No cyanosis, no use of accessory musculature GI: No organomegaly, abdomen is soft and non-tender    Images:  @ENCIMAGES @  Labs:  Lab Results  Component Value Date   HGBA1C 4.5 (L) 11/21/2021   HGBA1C 4.7 (L) 04/21/2021   HGBA1C 5.7 (H) 02/13/2021   ESRSEDRATE 9 02/27/2022   ESRSEDRATE 97 (H) 02/05/2021   ESRSEDRATE 22 (H) 06/24/2015   CRP <0.5 02/27/2022   CRP 3.6 (H) 02/05/2021   CRP 0.6 06/24/2015   REPTSTATUS PENDING 02/04/2023   REPTSTATUS PENDING 02/04/2023   GRAMSTAIN  02/04/2023    FEW WBC PRESENT, PREDOMINANTLY PMN ABUNDANT GRAM POSITIVE COCCI IN PAIRS RARE GRAM NEGATIVE COCCOBACILLI Performed at [facility],  [address] [place], [place] [address]    GRAMSTAIN  02/04/2023    MODERATE WBC PRESENT, PREDOMINANTLY PMN MODERATE GRAM POSITIVE COCCI Performed at [facility], [address], [place], [place] [address]    CULT  02/04/2023    ABUNDANT STAPHYLOCOCCUS AUREUS CULTURE REINCUBATED FOR BETTER GROWTH NO ANAEROBES ISOLATED; CULTURE IN PROGRESS FOR 5 DAYS    CULT  02/04/2023    ABUNDANT STAPHYLOCOCCUS AUREUS CULTURE REINCUBATED FOR BETTER GROWTH NO ANAEROBES ISOLATED; CULTURE IN PROGRESS FOR 5 DAYS    LABORGA STAPHYLOCOCCUS AUREUS 02/28/2022    Lab Results  Component Value Date   ALBUMIN 2.7 (L) 02/03/2023   ALBUMIN 3.7 06/08/2022   ALBUMIN 3.2 (L) 04/08/2022   PREALBUMIN 22 02/27/2022        Latest Ref Rng & Units 02/07/2023    3:18 AM 02/05/2023    6:20 AM 02/04/2023    3:31 AM  CBC EXTENDED  WBC 4.0 - 10.5 K/uL 17.9  22.6  22.5   RBC 4.22 - 5.81 MIL/uL 3.59  3.66  3.78   Hemoglobin 13.0 - 17.0 g/dL 8.1  8.5  8.5   HCT 64.4 - 52.0 % 25.4  25.3  26.4   Platelets 150 - 400 K/uL 224  209  206     Neurologic: Patient does not have protective sensation bilateral lower extremities.   MUSCULOSKELETAL:   Skin: Examination patient has an open wound lateral aspect of the right foot status post fourth and fifth ray amputations.  There is ischemic changes throughout the hindfoot and midfoot.  Patient does not have a palpable pulse.  Examination the left foot he has some mild ischemic changes to the left heel he does not have palpable pulses.  Patient is also status post a ring finger amputation on the right.  White cell count 17.9  with a hemoglobin of 8.1.  Albumin 2.7.  Ankle-brachial indices studies are pending.  Assessment: Assessment: Diabetes with end-stage renal disease on dialysis with extensive gangrene right foot with mild ischemic changes left heel.  Plan: Will plan for a right transtibial amputation on Wednesday.  I will consult vascular vein surgery to see if  endovascular intervention on the left side is an option.  Thank you for the consult and the opportunity to see Mr. Toshiyuki Jensen, MD [facility] [phone] 7:25 AM

## 2023-02-07 NOTE — Assessment & Plan Note (Addendum)
02-07-2023 CBG low today. Pt confused. Will give 1/2 amp D50 IV. Hypoglycemia may be due to pt being NPO for his angiogram today. 02-08-2023 diabetic hypoglycemia improved with IVF with dextrose. 02-09-2023 continue D5 LR @ 25 ml/hr until po intake back to normal. 02-10-2023 pt advised he need to continue to eat 3 times a day, even if it is just a snack. 02-12-2023 continue encourage pt that he needs to continue to eat TID and have lots of protein in his diet to promote wound healing. 02-13-2023 pt not on any diabetic meds. I think his hypoglycemia is more related to lack of adequate PO intake. Advised pt that he need to eat food even if he does feel hungry or he doesn't like the taste of his food.

## 2023-02-07 NOTE — Assessment & Plan Note (Addendum)
02-11-2023 Chronic. Will convert back to po keppra.

## 2023-02-07 NOTE — Progress Notes (Addendum)
Pt receives out-pt HD at [facility] on [facility]. Pt has a 5:15 am chair time. Will assist as needed.   Olivia Canter Renal Navigator [phone]

## 2023-02-07 NOTE — Consult Note (Signed)
Hospital Consult   MRN #:  [id number]  History of Present Illness: This is a 52 y.o. male with history of diabetes, end-stage renal disease who presents with poorly healing right foot wound.  The patient recently had a ray amputation of the right foot, which is continued to deteriorate.  I was consulted by [name] to the femoral vein for possible angiography in an effort to lower the level of amputation moving forward.    On exam, Dellia Nims was resting comfortably in dialysis.  Certainly available his wife was bedside in his room, whom I spoke to at length.  Past Medical History:  Diagnosis Date   Acute bilateral cerebral infarction in a watershed distribution [facility]) 04/21/2021   a.) MRI brain 04/21/2021 --> patchy ACUTE cortically-based infarcts within the bilateral high frontal lobes and right parietal lobe   Acute cerebral infarction (HCC) 02/13/2021   a.) MRI brain 02/13/2021: acute LEFT hippocampal infarct   Acute cerebral infarction (HCC) 04/21/2021   a.) MRI brain 04/21/2021: subcentimeter ACUTE infarct within the callosal splenium on the LEFT   Anemia of chronic renal failure    Anxiety    Aortic dilatation (HCC) 11/25/2020   a.) TTE 11/25/2020: Ao root measured 38 mm. b.) TTE 04/22/2021: Ao root measured 44 mm; c. 11/2021 Echo: Ao root 40mm.   Atrial fibrillation (HCC)    a.) CHA2DS2-VASc = 6 (CHF, HTN, CVA x2, prior MI, T2DM). b.) rate/rhythm maintained on oral amiodarone + carvedilol; chronic OAC/AP therapy discontinued following ICH   BPH (benign prostatic hyperplasia)    Cardiac arrest (HCC) 07/26/2021   a.) in setting of hyperkalemia, NSTEMI, and seizure following missed HD; pulseless and apneic --> required 1 round of CPR prior to ROSC   Cellulitis    Cerebral hemorrhage (HCC) 11/21/2021   a.) CT head 11/21/2021 --> 1.4 x 1.5 x 3.1 cm ACUTE hemorrhage within the inferior aspect of the fourth ventricle, and extending inferiorly through the foramen of foramen of [place] -->  occurred in setting of HTN emergency and DOAC/APT-->eliquis/plavix d/c'd.   Cerebral microvascular disease    Chronic diarrhea    Chronic heart failure with preserved ejection fraction (HFpEF) (HCC) 07/19/2017   a.) 06/2017 Echo: EF 75%; b.) 11/2020 Echo: EF 55-60%; c.) 04/2021 Echo: EF 55-60%; d.) 11/2021 Echo: EF 65-70%, GrII DD, nl RV fxn, sev dil LA, large circumferential pericardial eff w/o tamponade, triv MR, AoV sclerosis. Ao root 40mm; e.) TTE 02/28/2025: EF 60-65%, sev LVH, mod posterior LV effusion, AoV sclerosis   Chronic pain of both ankles    Coronary artery disease 02/26/2020   a. 02/2020 Ant STEMI/PCI: LM nl, LAD 32m (3.5x30 Resolute Onyx), RI nl, LCX min irregs, RCA min irregs. EF 65%.   DDD (degenerative disc disease), cervical    Diabetes mellitus, type II (HCC)    Diabetic neuropathy (HCC)    Esophagitis    ESRD (end stage renal disease) on dialysis (HCC)    a.) [facility]; T-Th-Sat   Frequent falls    Gait instability    Gastritis    Gastroparesis    GERD (gastroesophageal reflux disease)    H/O enucleation of left eyeball    Heart palpitations    History of kidney stones    HLD (hyperlipidemia)    Hypertension    Insomnia    a.) uses melatonin PRN   Long term current use of amiodarone    Nausea and vomiting in adult    recurrent   NSTEMI (non-ST elevated myocardial infarction) (HCC)  07/26/2021   a.) hyperkalemic from missed HD; seizures; decompensated to cardiac arrest and required CPR x 1 round prior to ROSC.   Osteomyelitis (HCC)    Pericardial effusion    a. 11/2021 Echo: EF 65-70%, GrII DD, nl RV fxn, sev dil LA, large circumferential pericardial eff w/o tamponade, triv MR, AoV sclerosis. Ao root 40mm.   Seizure (HCC)    a.) last 07/26/2021 in setting of missed HD --> hyperkalemic at 6.5 --> pulseless/apneic and required CPR; discharged home on levetiracetam.   ST elevation myocardial infarction (STEMI) of anterior wall (HCC) 02/26/2020   a.) LHC/PCI 02/26/2020  --> EF 65%; LVEDP 11 mmHg; 90% mLAD (3.5 x 20 mm Resolute Onyx DES x 1)    Past Surgical History:  Procedure Laterality Date   A/V FISTULAGRAM Left 08/31/2021   Procedure: A/V Fistulagram;  Surgeon: Annice Needy, MD;  Location: [name] INVASIVE CV LAB;  Service: Cardiovascular;  Laterality: Left;   A/V FISTULAGRAM Left 01/25/2022   Procedure: A/V Fistulagram;  Surgeon: Annice Needy, MD;  Location: [name] INVASIVE CV LAB;  Service: Cardiovascular;  Laterality: Left;   A/V FISTULAGRAM Right 10/18/2022   Procedure: A/V Fistulagram;  Surgeon: Annice Needy, MD;  Location: [name] INVAS[facility] CV LAB;  Service: Cardiovascular;  Laterality: Right;   AMPUTATION Left 03/16/2016   Procedure: AMPUTATION DIGIT LEFT HALLUX;  Surgeon: Vivi Barrack, DPM;  Location: [facility] OR;  Service: Podiatry;  Laterality: Left;  can start around 5    AMPUTATION Right 02/09/2021   Procedure: AMPUTATION DIGIT;  Surgeon: Marlyne Beards, MD;  Location: [facility] OR;  Service: Orthopedics;  Laterality: Right;   AMPUTATION Right 02/04/2023   Procedure: AMPUTATION 4TH AND 5TH METATARSAL;  Surgeon: Pilar Plate, DPM;  Location: [facility] OR;  Service: Orthopedics/Podiatry;  Laterality: Right;  I&D, partial ray resection, abx beads   AMPUTATION TOE Right 02/28/2022   Procedure: AMPUTATION TOE;  Surgeon: Louann Sjogren, DPM;  Location: [name] ORS;  Service: Podiatry;  Laterality: Right;  right fifth toe   ARTHROSCOPIC REPAIR ACL Left    AV FISTULA PLACEMENT Right 05/31/2019   Procedure: Brachiocephalic AV fistula creation;  Surgeon: Annice Needy, MD;  Location: [name] ORS;  Service: Vascular;  Laterality: Right;   AV FISTULA PLACEMENT Left 08/13/2021   Procedure: ARTERIOVENOUS (AV) FISTULA CREATION (BRACHIALCEPHALIC);  Surgeon: Annice Needy, MD;  Location: [name] ORS;  Service: Vascular;  Laterality: Left;   COLONOSCOPY WITH PROPOFOL N/A 10/28/2015   Procedure: COLONOSCOPY WITH PROPOFOL;  Surgeon: Christena Deem, MD;  Location: [facility] ENDOSCOPY;   Service: Endoscopy;  Laterality: N/A;   COLONOSCOPY WITH PROPOFOL N/A 10/29/2015   Procedure: COLONOSCOPY WITH PROPOFOL;  Surgeon: Christena Deem, MD;  Location: [facility] ENDOSCOPY;  Service: Endoscopy;  Laterality: N/A;   COLONOSCOPY WITH PROPOFOL N/A 01/27/2023   Procedure: COLONOSCOPY WITH PROPOFOL;  Surgeon: Franky Macho, MD;  Location: [facility];  Service: Endoscopy;  Laterality: N/A;  12:30pm, asa 3   CORONARY ULTRASOUND/IVUS N/A 02/26/2020   Procedure: Intravascular Ultrasound/IVUS;  Surgeon: Corky Crafts, MD;  Location: [facility] INVASIVE CV LAB;  Service: Cardiovascular;  Laterality: N/A;   CORONARY/GRAFT ACUTE MI REVASCULARIZATION N/A 02/26/2020   Procedure: Coronary/Graft Acute MI Revascularization;  Surgeon: Corky Crafts, MD;  Location: [facility] INVASIVE CV LAB;  Service: Cardiovascular;  Laterality: N/A;   DIALYSIS/PERMA CATHETER INSERTION Right 04/26/2019   Perm Cath    DIALYSIS/PERMA CATHETER INSERTION N/A 04/26/2019   Procedure: DIALYSIS/PERMA CATHETER INSERTION;  Surgeon: Annice Needy, MD;  Location: [name] INV[facility]IVE CV LAB;  Service: Cardiovascular;  Laterality: N/A;   DIALYSIS/PERMA CATHETER INSERTION N/A 05/25/2021   Procedure: DIALYSIS/PERMA CATHETER INSERTION;  Surgeon: Annice Needy, MD;  Location: [name] CV [facility];  Service: Cardiovascular;  Laterality: N/A;   DIALYSIS/PERMA CATHETER INSERTION N/A 08/31/2021   Procedure: DIALYSIS/PERMA CATHETER INSERTION;  Surgeon: Annice Needy, MD;  Location: [name] INV[facility] CV LAB;  Service: Cardiovascular;  Laterality: N/A;   DIALYSIS/PERMA CATHETER REMOVAL N/A 08/15/2019   Procedure: DIALYSIS/PERMA CATHETER REMOVAL;  Surgeon: Renford Dills, MD;  Location: [name] INVASIVE CV LAB;  Service: Cardiovascular;  Laterality: N/A;   DIALYSIS/PERMA CATHETER REMOVAL N/A 03/15/2022   Procedure: DIALYSIS/PERMA CATHETER REMOVAL;  Surgeon: Annice Needy, MD;  Location: [name] INV[facility] CV [facility];  Service: Cardiovascular;  Laterality: N/A;    EMBOLIZATION (CATH LAB) Right 02/06/2021   Procedure: EMBOLIZATION;  Surgeon: Annice Needy, MD;  Location: [name] INVASIVE CV LAB;  Service: Cardiovascular;  Laterality: Right;  Right Upper Extremity Dialysis Access, Permcath Placement   ESOPHAGOGASTRODUODENOSCOPY (EGD) WITH PROPOFOL N/A 12/27/2017   Procedure: ESOPHAGOGASTRODUODENOSCOPY (EGD) WITH PROPOFOL;  Surgeon: Toledo, Boykin Nearing, MD;  Location: [name] ENDOSCOPY;  Service: Gastroenterology;  Laterality: N/A;   EYE SURGERY     LEFT HEART CATH AND CORONARY ANGIOGRAPHY N/A 02/26/2020   Procedure: LEFT HEART CATH AND CORONARY ANGIOGRAPHY;  Surgeon: Corky Crafts, MD;  Location: [facility] INVASIVE CV LAB;  Service: Cardiovascular;  Laterality: N/A;   PROSTATE SURGERY  2016   REVISON OF ARTERIOVENOUS FISTULA Right 07/22/2022   Procedure: RESECTION OF ANEURYSMAL RIGHT ARM ARTERIOVENOUS FISTULA;  Surgeon: Annice Needy, MD;  Location: [name] ORS;  Service: Vascular;  Laterality: Right;   TONSILECTOMY/ADENOIDECTOMY WITH MYRINGOTOMY     TONSILLECTOMY     UPPER EXTREMITY ANGIOGRAPHY Right 11/26/2020   Procedure: Upper Extremity Angiography;  Surgeon: Annice Needy, MD;  Location: [name] INV[facility] CV LAB;  Service: Cardiovascular;  Laterality: Right;   UPPER EXTREMITY ANGIOGRAPHY Right 02/05/2021   Procedure: UPPER EXTREMITY ANGIOGRAPHY;  Surgeon: Annice Needy, MD;  Location: [name] INV[facility] CV LAB;  Service: Cardiovascular;  Laterality: Right;    Allergies  Allergen Reactions   Promethazine Diarrhea and Other (See Comments)    Muscle cramps   Promethazine Hcl Other (See Comments)    Cramping all over  Other reaction(s): cramp all over   Other     Other reaction(s): Unknown Other reaction(s): Unknown    Prior to Admission medications   Medication Sig Start Date End Date Taking? Authorizing Provider  acetaminophen (TYLENOL) 325 MG tablet Take 1-2 tablets (325-650 mg total) by mouth every 4 (four) hours as needed for mild pain. 05/04/21  Yes Love, Evlyn Kanner, PA-C  amiodarone (PACERONE) 200 MG tablet Take 1 tablet (200 mg total) by mouth every morning. 08/02/22 08/03/23 Yes Corky Crafts, MD  amLODipine (NORVASC) 5 MG tablet Take 5 mg by mouth daily.   Yes [provider]  ammonium lactate (AMLACTIN) 12 % cream Apply 1 Application topically as needed for dry skin. 05/25/22  Yes Vivi Barrack, DPM  atorvastatin (LIPITOR) 80 MG tablet TAKE ONE TABLET BY MOUTH AT BEDTIME. 05/07/22  Yes Corky Crafts, MD  calcium acetate (PHOSLO) 667 MG capsule Take 2 capsules (1,334 mg total) by mouth 3 (three) times daily with meals. 05/04/21  Yes Love, Evlyn Kanner, PA-C  carvedilol (COREG) 25 MG tablet Take 25 mg by mouth 2 (two) times daily. 06/16/21  Yes [provider]  dorzolamide-timolol (COSOPT) 2-0.5 %  ophthalmic solution Place 1 drop into the right eye 2 (two) times daily. 11/15/22 11/15/23 Yes [provider]  levETIRAcetam (KEPPRA) 250 MG tablet Take 1 tablet (250 mg total) by mouth Every Tuesday,Thursday,and Saturday with dialysis. 04/08/22  Yes Johnson, Clanford L, MD  levETIRAcetam (KEPPRA) 500 MG tablet Take 1 tablet (500 mg total) by mouth daily after breakfast. 04/08/22  Yes Johnson, Clanford L, MD  lidocaine-prilocaine (EMLA) cream Apply topically. 12/24/21  Yes [provider]  amphetamine-dextroamphetamine (ADDERALL XR) 5 MG 24 hr capsule Take 5 mg by mouth daily.    [provider]  fluticasone (FLONASE) 50 MCG/ACT nasal spray Place 2 sprays into both nostrils daily. 10/05/22   Leath-Warren, Sadie Haber, NP  hydrALAZINE (APRESOLINE) 25 MG tablet Take 25 mg by mouth 3 (three) times daily. Patient not taking: Reported on 02/03/2023 03/30/22   [provider]  hydrocortisone (ANUSOL-HC) 2.5 % rectal cream Place 1 Application rectally 2 (two) times daily. Patient not taking: Reported on 02/03/2023 10/05/22   Leath-Warren, Sadie Haber, NP  nitroGLYCERIN (NITROSTAT) 0.4 MG SL tablet Place 1 tablet (0.4  mg total) under the tongue every 5 (five) minutes as needed for chest pain. 03/24/22   Corky Crafts, MD  polyethylene glycol (MIRALAX / GLYCOLAX) 17 g packet Take 17 g by mouth 2 (two) times daily. Patient taking differently: Take 17 g by mouth 2 (two) times daily. New medication from Dr. Burgess Estelle, have to pick up at Pharmacy. 01/27/23 04/27/23  Franky Macho, MD  psyllium (METAMUCIL) 58.6 % packet Take 1 packet by mouth 2 (two) times daily. Patient taking differently: Take 1 packet by mouth 2 (two) times daily. New medication from Dr. Burgess Estelle, have to pick up at Pharmacy. 01/27/23 04/27/23  Franky Macho, MD    Social History   Socioeconomic History   Marital status: Married    Spouse name: Martie Lee    Number of children: 2   Years of education: Not on file   Highest education level: High school graduate  Occupational History   Occupation: Disability    Comment: not employed  Tobacco Use   Smoking status: Never   Smokeless tobacco: Never  Vaping Use   Vaping status: Never Used  Substance and Sexual Activity   Alcohol use: No   Drug use: No   Sexual activity: Yes  Other Topics Concern   Not on file  Social History Narrative   Oldest son killed in car crash June 2020.  Lives in [place] with his wife.   Social Drivers of Health   Financial Resource Strain: High Risk (07/05/2017)   Overall Financial Resource Strain (CARDIA)    Difficulty of Paying Living Expenses: Hard  Food Insecurity: Patient Declined (02/04/2023)   Hunger Vital Sign    Worried About Running Out of Food in the Last Year: Patient declined    Ran Out of Food in the Last Year: Patient declined  Transportation Needs: Patient Declined (02/04/2023)   PRAPARE - Administrator, Civil Service (Medical): Patient declined    Lack of Transportation (Non-Medical): Patient declined  Physical Activity: Inactive (07/05/2017)   Exercise Vital Sign    Days of Exercise per Week: 0 days     Minutes of Exercise per Session: 0 min  Stress: Stress Concern Present (07/05/2017)   [employer] of Occupational Health - Occupational Stress Questionnaire    Feeling of Stress : Rather much  Social Connections: Moderately Integrated (07/05/2017)   Social Connection and Isolation  Panel [NHANES]    Frequency of Communication with Friends and Family: Three times a week    Frequency of Social Gatherings with Friends and Family: Once a week    Attends Religious Services: More than 4 times per year    Active Member of [employer] or Organizations: No    Attends Banker Meetings: Never    Marital Status: Married  Catering manager Violence: Patient Declined (02/04/2023)   Humiliation, Afraid, Rape, and Kick questionnaire    Fear of Current or Ex-Partner: Patient declined    Emotionally Abused: Patient declined    Physically Abused: Patient declined    Sexually Abused: Patient declined    Family History  Problem Relation Age of Onset   CAD Father    Stroke Father    Diabetes Mellitus II Mother    Kidney failure Mother    Schizophrenia Mother     ROS: Otherwise negative unless mentioned in HPI  Physical Examination  Vitals:   02/07/23 0815 02/07/23 0830  BP: (!) 100/55 (!) 86/34  Pulse: (!) 33 (!) 103  Resp: 19 19  Temp:    SpO2: 99% 100%   Body mass index is 21.8 kg/m.  General:  WDWN in NAD Gait: Not observed HENT: WNL, normocephalic Pulmonary: normal non-labored breathing, without Rales, rhonchi,  wheezing Cardiac: regular, Abdomen:  soft, NT/ND, no masses Skin: without rashes Vascular Exam/Pulses: 2+ ATs and femorals Extremities: with ischemic changes, with Gangrene , without cellulitis; with open wounds;  Musculoskeletal: no muscle wasting or atrophy  Neurologic: A&O X 3;  No focal weakness or paresthesias are detected; speech is fluent/normal Psychiatric:  The pt has Normal affect. Lymph:  Unremarkable  CBC    Component Value Date/Time   WBC 17.9  (H) 02/07/2023 0318   RBC 3.59 (L) 02/07/2023 0318   HGB 8.1 (L) 02/07/2023 0318   HCT 25.4 (L) 02/07/2023 0318   PLT 224 02/07/2023 0318   MCV 70.8 (L) 02/07/2023 0318   MCH 22.6 (L) 02/07/2023 0318   MCHC 31.9 02/07/2023 0318   RDW 20.6 (H) 02/07/2023 0318   LYMPHSABS 0.7 02/03/2023 1115   MONOABS 1.7 (H) 02/03/2023 1115   EOSABS 0.0 02/03/2023 1115   BASOSABS 0.0 02/03/2023 1115    BMET    Component Value Date/Time   NA 135 02/07/2023 0318   K 4.0 02/07/2023 0318   CL 101 02/07/2023 0318   CO2 19 (L) 02/07/2023 0318   GLUCOSE 103 (H) 02/07/2023 0318   BUN 105 (H) 02/07/2023 0318   CREATININE 14.30 (H) 02/07/2023 0318   CALCIUM 8.7 (L) 02/07/2023 0318   GFRNONAA 4 (L) 02/07/2023 0318   GFRAA 5 (L) 08/27/2019 1540    COAGS: Lab Results  Component Value Date   INR 1.1 03/01/2022   INR 1.6 (H) 11/21/2021   INR 1.3 (H) 08/12/2021       ASSESSMENT/PLAN: This is a 52 y.o. male with deteriorating right foot wound.  Medical comorbidities include diabetes, end-stage renal disease.  Although he has a palpable dorsalis pedis pulse at the level of the distal anterior tibial artery, there is no palpable dorsalis pedis.  I had a long conversation with him regarding his lower extremity perfusion.  With his comorbidities, I am very concerned he has severe microvascular disease which is leading to poor wound healing.  We discussed right lower extremity angiography in an effort to define and improve distal perfusion in hopes that this will provide enough improvement for wound healing.  After discussing  risk and benefits, he elected to proceed.  Patient is n.p.o. Will perform his angiogram today with Dr. Hetty Blend.  Victorino Sparrow MD [facility] [phone] 02/07/2023  8:34 AM

## 2023-02-07 NOTE — Assessment & Plan Note (Addendum)
On admission to 02-06-2023. Pt has osteomyelitis of right foot. Pt taken to OR on 02-04-2023 by podiatry. Pt had Partial fourth ray amputation, right foot.  Further resection of fifth metatarsal bone, right foot. [name] with orthopedics saw the patient and felt that pt had gangrene of the foot.  02-07-2023 pt will need right BKA by ortho. Planned on Wednesday Feb 09, 2023. 02-08-2023 angiogram today to see if he will need left lower extremity surgery. 02-10-2023 s/p right BKA on 02-09-2023. Gangrene has been surgically cured.

## 2023-02-07 NOTE — Assessment & Plan Note (Addendum)
02-07-2023 continue with amiodarone 200 mg qday 02-08-2023 off systemic anticoagulation in preparation BKA  02-09-2023 will need to ask ortho when systemic anticoagulation can be resumed after his right BKA. 02-10-2023 have asked ortho when eliquis can be restarted 02-11-2023 Continue amiodarone 200 mg qday. Upon further review of his old EMR and cardiology records. Pt was taken off systemic anticoagulation by neurology after his intracerebral hemorrhage in 11-2021.  Pt was also on plavix at that time. This was also stopped.  However, given he has PVD and it at risk of losing his left LE to amputation, I think that low dose ASA is the best that can be done to balance his PVD, CVA risk and minimize bleeding.  02-12-2023 continue with ASA 81 mg daily. 02-13-2023 had a bout of tachycardia this AM. RN though it was rapid afib. Pt found to be hypoglycemic. Continue with amiodarone. Only on ASA for CVA prophylaxis due to prior hx of ICH while taking eliquis in 11-2021.

## 2023-02-07 NOTE — Assessment & Plan Note (Addendum)
02-07-2023 thru 02-19-2023 continue with HD per nephrology 02-10-2023 thru 02-13-2023 continue HD

## 2023-02-07 NOTE — Progress Notes (Signed)
Pt arrived to the unit went to introduce myself and assess pt. Pt refuses to answer questions except for he is in pain and he then began to ignore this RN when trying to complete assessment

## 2023-02-07 NOTE — Assessment & Plan Note (Addendum)
02-07-2023 stable. 02-10-2023  Hgb 8.6 02-13-2023 HgB 8.1 g/dl. Stable.

## 2023-02-07 NOTE — Progress Notes (Signed)
I discussed this patient's care pathway with [name] this afternoon. There was some confusion about which limb was salvageable.  Unfortunately, due to the severe tissue loss in the right leg, this was deemed nonsalvageable.  The left leg has a small, superficial ulceration on the heel, and due to the rapid decline in the right leg, there is concern the same left and the left.  With left-sided critical limb ischemia and [place] 5 tissue loss, [name] would benefit from left lower extremity angiography. I have discussed this with the patient, Dr. Hetty Blend, and the Cath Lab.  Plan will be for LEFT-sided angiogram tomorrow in an effort to optimize perfusion to prevent limb loss. Plan for right sided below-knee amputation Wednesday with [name].   Victorino Sparrow MD

## 2023-02-07 NOTE — Progress Notes (Addendum)
Pharmacy Antibiotic Note  [name]. is a 52 y.o. male for which pharmacy has been consulted for cefepime and vancomycin dosing for right foot wound infection .  D5 abx. WBC down to 17.9; LA 1.1; afebrile. VVS to perform angiogram tomorrow with plans for ortho to perform R transtibial amputation on Wednesday.  Plan: Cefepime 1g q24h (given after HD on HD days) Vancomycin 750mg  qMWF after HD Monitor WBC, fever, renal function, cultures Obtain levels as needed De-escalate when able F/u Nephrology plan  Height: 6\' 3"  (190.5 cm) Weight: 79.3 kg (174 lb 13.2 oz) (bed) IBW/kg (Calculated) : 84.5  Temp (24hrs), Avg:98.8 F (37.1 C), Min:97.8 F (36.6 C), Max:100.3 F (37.9 C)  Recent Labs  Lab 02/03/23 1115 02/03/23 1256 02/04/23 0331 02/05/23 0620 02/07/23 0318  WBC 22.3*  --  22.5* 22.6* 17.9*  CREATININE 18.57*  --  18.70* 10.81* 14.30*  LATICACIDVEN  --  1.1  --   --   --     Estimated Creatinine Clearance: 6.6 mL/min (A) (by C-G formula based on SCr of 14.3 mg/dL (H)).    Allergies  Allergen Reactions   Promethazine Diarrhea and Other (See Comments)    Muscle cramps   Promethazine Hcl Other (See Comments)    Cramping all over  Other reaction(s): cramp all over   Other     Other reaction(s): Unknown Other reaction(s): Unknown   Microbiology results: 12/12 bcx ngtd4 12/13 R foot wound cx: abundant staph aureus, reincubating  Thank you for allowing pharmacy to be a part of this patient's care.  [name], PharmD PGY1 Pharmacy Resident 02/07/2023 2:39 PM

## 2023-02-07 NOTE — Subjective & Objective (Addendum)
Pt seen and examined.  Wife not at bedside this AM. Pt had another hypoglycemic episode this AM(0537) with CBG 57. Pt eating very little. States the food doesn't taste good.

## 2023-02-07 NOTE — H&P (View-Only) (Signed)
ORTHOPAEDIC CONSULTATION  REQUESTING PHYSICIAN: Carollee Herter, DO  Chief Complaint: Necrotic gangrenous right foot with ischemic changes to the left heel.  HPI: [name] Damine Benjamin. is a 52 y.o. male who presents with type 2 diabetes with end-stage renal disease on dialysis with extensive gangrenous changes to the right foot with mild ischemic changes to the left heel.  Patient states they are both painful.  Patient is status post fourth and fifth ray amputations with podiatry with progressive ischemic changes.  Past Medical History:  Diagnosis Date   Acute bilateral cerebral infarction in a watershed distribution [facility]) 04/21/2021   a.) MRI brain 04/21/2021 --> patchy ACUTE cortically-based infarcts within the bilateral high frontal lobes and right parietal lobe   Acute cerebral infarction (HCC) 02/13/2021   a.) MRI brain 02/13/2021: acute LEFT hippocampal infarct   Acute cerebral infarction (HCC) 04/21/2021   a.) MRI brain 04/21/2021: subcentimeter ACUTE infarct within the callosal splenium on the LEFT   Anemia of chronic renal failure    Anxiety    Aortic dilatation (HCC) 11/25/2020   a.) TTE 11/25/2020: Ao root measured 38 mm. b.) TTE 04/22/2021: Ao root measured 44 mm; c. 11/2021 Echo: Ao root 40mm.   Atrial fibrillation (HCC)    a.) CHA2DS2-VASc = 6 (CHF, HTN, CVA x2, prior MI, T2DM). b.) rate/rhythm maintained on oral amiodarone + carvedilol; chronic OAC/AP therapy discontinued following ICH   BPH (benign prostatic hyperplasia)    Cardiac arrest (HCC) 07/26/2021   a.) in setting of hyperkalemia, NSTEMI, and seizure following missed HD; pulseless and apneic --> required 1 round of CPR prior to ROSC   Cellulitis    Cerebral hemorrhage (HCC) 11/21/2021   a.) CT head 11/21/2021 --> 1.4 x 1.5 x 3.1 cm ACUTE hemorrhage within the inferior aspect of the fourth ventricle, and extending inferiorly through the foramen of foramen of [place] --> occurred in setting of HTN emergency and  DOAC/APT-->eliquis/plavix d/c'd.   Cerebral microvascular disease    Chronic diarrhea    Chronic heart failure with preserved ejection fraction (HFpEF) (HCC) 07/19/2017   a.) 06/2017 Echo: EF 75%; b.) 11/2020 Echo: EF 55-60%; c.) 04/2021 Echo: EF 55-60%; d.) 11/2021 Echo: EF 65-70%, GrII DD, nl RV fxn, sev dil LA, large circumferential pericardial eff w/o tamponade, triv MR, AoV sclerosis. Ao root 40mm; e.) TTE 02/28/2025: EF 60-65%, sev LVH, mod posterior LV effusion, AoV sclerosis   Chronic pain of both ankles    Coronary artery disease 02/26/2020   a. 02/2020 Ant STEMI/PCI: LM nl, LAD 33m (3.5x30 Resolute Onyx), RI nl, LCX min irregs, RCA min irregs. EF 65%.   DDD (degenerative disc disease), cervical    Diabetes mellitus, type II (HCC)    Diabetic neuropathy (HCC)    Esophagitis    ESRD (end stage renal disease) on dialysis (HCC)    a.) [facility]; T-Th-Sat   Frequent falls    Gait instability    Gastritis    Gastroparesis    GERD (gastroesophageal reflux disease)    H/O enucleation of left eyeball    Heart palpitations    History of kidney stones    HLD (hyperlipidemia)    Hypertension    Insomnia    a.) uses melatonin PRN   Long term current use of amiodarone    Nausea and vomiting in adult    recurrent   NSTEMI (non-ST elevated myocardial infarction) (HCC) 07/26/2021   a.) hyperkalemic from missed HD; seizures; decompensated to cardiac arrest and required CPR x 1 round  prior to ROSC.   Osteomyelitis (HCC)    Pericardial effusion    a. 11/2021 Echo: EF 65-70%, GrII DD, nl RV fxn, sev dil LA, large circumferential pericardial eff w/o tamponade, triv MR, AoV sclerosis. Ao root 40mm.   Seizure (HCC)    a.) last 07/26/2021 in setting of missed HD --> hyperkalemic at 6.5 --> pulseless/apneic and required CPR; discharged home on levetiracetam.   ST elevation myocardial infarction (STEMI) of anterior wall (HCC) 02/26/2020   a.) LHC/PCI 02/26/2020 --> EF 65%; LVEDP 11 mmHg; 90% mLAD (3.5  x 20 mm Resolute Onyx DES x 1)   Past Surgical History:  Procedure Laterality Date   A/V FISTULAGRAM Left 08/31/2021   Procedure: A/V Fistulagram;  Surgeon: Annice Needy, MD;  Location: [name] INVASIVE CV LAB;  Service: Cardiovascular;  Laterality: Left;   A/V FISTULAGRAM Left 01/25/2022   Procedure: A/V Fistulagram;  Surgeon: Annice Needy, MD;  Location: [name] IN[facility];  Service: Cardiovascular;  Laterality: Left;   A/V FISTULAGRAM Right 10/18/2022   Procedure: A/V Fistulagram;  Surgeon: Annice Needy, MD;  Location: [name];  Service: Cardiovascular;  Laterality: Right;   AMPUTATION Left 03/16/2016   Procedure: AMPUTATION DIGIT LEFT HALLUX;  Surgeon: Vivi Barrack, DPM;  Location: [facility] OR;  Service: Podiatry;  Laterality: Left;  can start around 5    AMPUTATION Right 02/09/2021   Procedure: AMPUTATION DIGIT;  Surgeon: Marlyne Beards, MD;  Location: [facility] OR;  Service: Orthopedics;  Laterality: Right;   AMPUTATION Right 02/04/2023   Procedure: AMPUTATION 4TH AND 5TH METATARSAL;  Surgeon: Pilar Plate, DPM;  Location: [facility] OR;  Service: Orthopedics/Podiatry;  Laterality: Right;  I&D, partial ray resection, abx beads   AMPUTATION TOE Right 02/28/2022   Procedure: AMPUTATION TOE;  Surgeon: Louann Sjogren, DPM;  Location: [name] ORS;  Service: Podiatry;  Laterality: Right;  right fifth toe   ARTHROSCOPIC REPAIR ACL Left    AV FISTULA PLACEMENT Right 05/31/2019   Procedure: Brachiocephalic AV fistula creation;  Surgeon: Annice Needy, MD;  Location: [name] ORS;  Service: Vascular;  Laterality: Right;   AV FISTULA PLACEMENT Left 08/13/2021   Procedure: ARTERIOVENOUS (AV) FISTULA CREATION (BRACHIALCEPHALIC);  Surgeon: Annice Needy, MD;  Location: [name] ORS;  Service: Vascular;  Laterality: Left;   COLONOSCOPY WITH PROPOFOL N/A 10/28/2015   Procedure: COLONOSCOPY WITH PROPOFOL;  Surgeon: Christena Deem, MD;  Location: [facility] ENDOSCOPY;  Service: Endoscopy;  Laterality: N/A;    COLONOSCOPY WITH PROPOFOL N/A 10/29/2015   Procedure: COLONOSCOPY WITH PROPOFOL;  Surgeon: Christena Deem, MD;  Location: [facility] ENDOSCOPY;  Service: Endoscopy;  Laterality: N/A;   COLONOSCOPY WITH PROPOFOL N/A 01/27/2023   Procedure: COLONOSCOPY WITH PROPOFOL;  Surgeon: Franky Macho, MD;  Location: [facility];  Service: Endoscopy;  Laterality: N/A;  12:30pm, asa 3   CORONARY ULTRASOUND/IVUS N/A 02/26/2020   Procedure: Intravascular Ultrasound/IVUS;  Surgeon: Corky Crafts, MD;  Location: [facility] INVASIVE CV LAB;  Service: Cardiovascular;  Laterality: N/A;   CORONARY/GRAFT ACUTE MI REVASCULARIZATION N/A 02/26/2020   Procedure: Coronary/Graft Acute MI Revascularization;  Surgeon: Corky Crafts, MD;  Location: [facility] INVASIVE CV LAB;  Service: Cardiovascular;  Laterality: N/A;   DIALYSIS/PERMA CATHETER INSERTION Right 04/26/2019   Perm Cath    DIALYSIS/PERMA CATHETER INSERTION N/A 04/26/2019   Procedure: DIALYSIS/PERMA CATHETER INSERTION;  Surgeon: Annice Needy, MD;  Location: [name];  Service: Cardiovascular;  Laterality: N/A;   DIALYSIS/PERMA CATHETER INSERTION N/A 05/25/2021  Procedure: DIALYSIS/PERMA CATHETER INSERTION;  Surgeon: Annice Needy, MD;  Location: [name];  Service: Cardiovascular;  Laterality: N/A;   DIALYSIS/PERMA CATHETER INSERTION N/A 08/31/2021   Procedure: DIALYSIS/PERMA CATHETER INSERTION;  Surgeon: Annice Needy, MD;  Location: [name] IN[facility];  Service: Cardiovascular;  Laterality: N/A;   DIALYSIS/PERMA CATHETER REMOVAL N/A 08/15/2019   Procedure: DIALYSIS/PERMA CATHETER REMOVAL;  Surgeon: Renford Dills, MD;  Location: [name];  Service: Cardiovascular;  Laterality: N/A;   DIALYSIS/PERMA CATHETER REMOVAL N/A 03/15/2022   Procedure: DIALYSIS/PERMA CATHETER REMOVAL;  Surgeon: Annice Needy, MD;  Location: [name];  Service: Cardiovascular;  Laterality: N/A;   EMBOLIZATION (CATH LAB) Right 02/06/2021    Procedure: EMBOLIZATION;  Surgeon: Annice Needy, MD;  Location: [name] INV[facility] CV [facility];  Service: Cardiovascular;  Laterality: Right;  Right Upper Extremity Dialysis Access, Permcath Placement   ESOPHAGOGASTRODUODENOSCOPY (EGD) WITH PROPOFOL N/A 12/27/2017   Procedure: ESOPHAGOGASTRODUODENOSCOPY (EGD) WITH PROPOFOL;  Surgeon: Toledo, Boykin Nearing, MD;  Location: [name] ENDOSCOPY;  Service: Gastroenterology;  Laterality: N/A;   EYE SURGERY     LEFT HEART CATH AND CORONARY ANGIOGRAPHY N/A 02/26/2020   Procedure: LEFT HEART CATH AND CORONARY ANGIOGRAPHY;  Surgeon: Corky Crafts, MD;  Location: [facility] INVASIVE CV LAB;  Service: Cardiovascular;  Laterality: N/A;   PROSTATE SURGERY  2016   REVISON OF ARTERIOVENOUS FISTULA Right 07/22/2022   Procedure: RESECTION OF ANEURYSMAL RIGHT ARM ARTERIOVENOUS FISTULA;  Surgeon: Annice Needy, MD;  Location: [name] ORS;  Service: Vascular;  Laterality: Right;   TONSILECTOMY/ADENOIDECTOMY WITH MYRINGOTOMY     TONSILLECTOMY     UPPER EXTREMITY ANGIOGRAPHY Right 11/26/2020   Procedure: Upper Extremity Angiography;  Surgeon: Annice Needy, MD;  Location: [name];  Service: Cardiovascular;  Laterality: Right;   UPPER EXTREMITY ANGIOGRAPHY Right 02/05/2021   Procedure: UPPER EXTREMITY ANGIOGRAPHY;  Surgeon: Annice Needy, MD;  Location: [name] INV[facility] CV [facility];  Service: Cardiovascular;  Laterality: Right;   Social History   Socioeconomic History   Marital status: Married    Spouse name: Martie Lee    Number of children: 2   Years of education: Not on file   Highest education level: High school graduate  Occupational History   Occupation: Disability    Comment: not employed  Tobacco Use   Smoking status: Never   Smokeless tobacco: Never  Vaping Use   Vaping status: Never Used  Substance and Sexual Activity   Alcohol use: No   Drug use: No   Sexual activity: Yes  Other Topics Concern   Not on file  Social History Narrative   Oldest son killed in car crash  June 2020.  Lives in [place] with his wife.   Social Drivers of Health   Financial Resource Strain: High Risk (07/05/2017)   Overall Financial Resource Strain (CARDIA)    Difficulty of Paying Living Expenses: Hard  Food Insecurity: Patient Declined (02/04/2023)   Hunger Vital Sign    Worried About Running Out of Food in the Last Year: Patient declined    Ran Out of Food in the Last Year: Patient declined  Transportation Needs: Patient Declined (02/04/2023)   PRAPARE - Administrator, Civil Service (Medical): Patient declined    Lack of Transportation (Non-Medical): Patient declined  Physical Activity: Inactive (07/05/2017)   Exercise Vital Sign    Days of Exercise per Week: 0 days    Minutes of Exercise per Session: 0 min  Stress:  Stress Concern Present (07/05/2017)   [employer] of Occupational Health - Occupational Stress Questionnaire    Feeling of Stress : Rather much  Social Connections: Moderately Integrated (07/05/2017)   Social Connection and Isolation Panel [NHANES]    Frequency of Communication with Friends and Family: Three times a week    Frequency of Social Gatherings with Friends and Family: Once a week    Attends Religious Services: More than 4 times per year    Active Member of [employer] or Organizations: No    Attends Engineer, structural: Never    Marital Status: Married   Family History  Problem Relation Age of Onset   CAD Father    Stroke Father    Diabetes Mellitus II Mother    Kidney failure Mother    Schizophrenia Mother    - negative except otherwise stated in the family history section Allergies  Allergen Reactions   Promethazine Diarrhea and Other (See Comments)    Muscle cramps   Promethazine Hcl Other (See Comments)    Cramping all over  Other reaction(s): cramp all over   Other     Other reaction(s): Unknown Other reaction(s): Unknown   Prior to Admission medications   Medication Sig Start Date End Date Taking?  Authorizing Provider  acetaminophen (TYLENOL) 325 MG tablet Take 1-2 tablets (325-650 mg total) by mouth every 4 (four) hours as needed for mild pain. 05/04/21  Yes Love, Evlyn Kanner, PA-C  amiodarone (PACERONE) 200 MG tablet Take 1 tablet (200 mg total) by mouth every morning. 08/02/22 08/03/23 Yes Corky Crafts, MD  amLODipine (NORVASC) 5 MG tablet Take 5 mg by mouth daily.   Yes [provider]  ammonium lactate (AMLACTIN) 12 % cream Apply 1 Application topically as needed for dry skin. 05/25/22  Yes Vivi Barrack, DPM  atorvastatin (LIPITOR) 80 MG tablet TAKE ONE TABLET BY MOUTH AT BEDTIME. 05/07/22  Yes Corky Crafts, MD  calcium acetate (PHOSLO) 667 MG capsule Take 2 capsules (1,334 mg total) by mouth 3 (three) times daily with meals. 05/04/21  Yes Love, Evlyn Kanner, PA-C  carvedilol (COREG) 25 MG tablet Take 25 mg by mouth 2 (two) times daily. 06/16/21  Yes [provider]  dorzolamide-timolol (COSOPT) 2-0.5 % ophthalmic solution Place 1 drop into the right eye 2 (two) times daily. 11/15/22 11/15/23 Yes [provider]  levETIRAcetam (KEPPRA) 250 MG tablet Take 1 tablet (250 mg total) by mouth Every Tuesday,Thursday,and Saturday with dialysis. 04/08/22  Yes Johnson, Clanford L, MD  levETIRAcetam (KEPPRA) 500 MG tablet Take 1 tablet (500 mg total) by mouth daily after breakfast. 04/08/22  Yes Johnson, Clanford L, MD  lidocaine-prilocaine (EMLA) cream Apply topically. 12/24/21  Yes [provider]  amphetamine-dextroamphetamine (ADDERALL XR) 5 MG 24 hr capsule Take 5 mg by mouth daily.    [provider]  fluticasone (FLONASE) 50 MCG/ACT nasal spray Place 2 sprays into both nostrils daily. 10/05/22   Leath-Warren, Sadie Haber, NP  hydrALAZINE (APRESOLINE) 25 MG tablet Take 25 mg by mouth 3 (three) times daily. Patient not taking: Reported on 02/03/2023 03/30/22   [provider]  hydrocortisone (ANUSOL-HC) 2.5 % rectal cream Place 1 Application  rectally 2 (two) times daily. Patient not taking: Reported on 02/03/2023 10/05/22   Leath-Warren, Sadie Haber, NP  nitroGLYCERIN (NITROSTAT) 0.4 MG SL tablet Place 1 tablet (0.4 mg total) under the tongue every 5 (five) minutes as needed for chest pain. 03/24/22   Corky Crafts, MD  polyethylene glycol (MIRALAX / GLYCOLAX) 17 g packet Take 17 g by mouth 2 (two) times daily. Patient taking differently: Take 17 g by mouth 2 (two) times daily. New medication from Dr. Burgess Estelle, have to pick up at Pharmacy. 01/27/23 04/27/23  Franky Macho, MD  psyllium (METAMUCIL) 58.6 % packet Take 1 packet by mouth 2 (two) times daily. Patient taking differently: Take 1 packet by mouth 2 (two) times daily. New medication from Dr. Burgess Estelle, have to pick up at Pharmacy. 01/27/23 04/27/23  Franky Macho, MD   No results found. - pertinent xrays, CT, MRI studies were reviewed and independently interpreted  Positive ROS: All other systems have been reviewed and were otherwise negative with the exception of those mentioned in the HPI and as above.  Physical Exam: General: Alert, no acute distress Psychiatric: Patient is competent for consent with normal mood and affect Lymphatic: No axillary or cervical lymphadenopathy Cardiovascular: No pedal edema Respiratory: No cyanosis, no use of accessory musculature GI: No organomegaly, abdomen is soft and non-tender    Images:  @ENCIMAGES @  Labs:  Lab Results  Component Value Date   HGBA1C 4.5 (L) 11/21/2021   HGBA1C 4.7 (L) 04/21/2021   HGBA1C 5.7 (H) 02/13/2021   ESRSEDRATE 9 02/27/2022   ESRSEDRATE 97 (H) 02/05/2021   ESRSEDRATE 22 (H) 06/24/2015   CRP <0.5 02/27/2022   CRP 3.6 (H) 02/05/2021   CRP 0.6 06/24/2015   REPTSTATUS PENDING 02/04/2023   REPTSTATUS PENDING 02/04/2023   GRAMSTAIN  02/04/2023    FEW WBC PRESENT, PREDOMINANTLY PMN ABUNDANT GRAM POSITIVE COCCI IN PAIRS RARE GRAM NEGATIVE COCCOBACILLI Performed at [facility],  [address] [place], [place] [address]    GRAMSTAIN  02/04/2023    MODERATE WBC PRESENT, PREDOMINANTLY PMN MODERATE GRAM POSITIVE COCCI Performed at [facility], [address], [place], [place] [address]    CULT  02/04/2023    ABUNDANT STAPHYLOCOCCUS AUREUS CULTURE REINCUBATED FOR BETTER GROWTH NO ANAEROBES ISOLATED; CULTURE IN PROGRESS FOR 5 DAYS    CULT  02/04/2023    ABUNDANT STAPHYLOCOCCUS AUREUS CULTURE REINCUBATED FOR BETTER GROWTH NO ANAEROBES ISOLATED; CULTURE IN PROGRESS FOR 5 DAYS    LABORGA STAPHYLOCOCCUS AUREUS 02/28/2022    Lab Results  Component Value Date   ALBUMIN 2.7 (L) 02/03/2023   ALBUMIN 3.7 06/08/2022   ALBUMIN 3.2 (L) 04/08/2022   PREALBUMIN 22 02/27/2022        Latest Ref Rng & Units 02/07/2023    3:18 AM 02/05/2023    6:20 AM 02/04/2023    3:31 AM  CBC EXTENDED  WBC 4.0 - 10.5 K/uL 17.9  22.6  22.5   RBC 4.22 - 5.81 MIL/uL 3.59  3.66  3.78   Hemoglobin 13.0 - 17.0 g/dL 8.1  8.5  8.5   HCT 64.4 - 52.0 % 25.4  25.3  26.4   Platelets 150 - 400 K/uL 224  209  206     Neurologic: Patient does not have protective sensation bilateral lower extremities.   MUSCULOSKELETAL:   Skin: Examination patient has an open wound lateral aspect of the right foot status post fourth and fifth ray amputations.  There is ischemic changes throughout the hindfoot and midfoot.  Patient does not have a palpable pulse.  Examination the left foot he has some mild ischemic changes to the left heel he does not have palpable pulses.  Patient is also status post a ring finger amputation on the right.  White cell count 17.9  with a hemoglobin of 8.1.  Albumin 2.7.  Ankle-brachial indices studies are pending.  Assessment: Assessment: Diabetes with end-stage renal disease on dialysis with extensive gangrene right foot with mild ischemic changes left heel.  Plan: Will plan for a right transtibial amputation on Wednesday.  I will consult vascular vein surgery to see if  endovascular intervention on the left side is an option.  Thank you for the consult and the opportunity to see Mr. [name] Jensen, MD [facility] [phone] 7:25 AM

## 2023-02-07 NOTE — Progress Notes (Signed)
   Asked to come to bedside to evaluate pt's scrotum.  [name] scrotum is macerated with some denuded skin. No vesicles. Wife denies pt having hx of genital herpes.  Will start nystatin powder and topical lidocaine jelly for pain.  Carollee Herter, DO [facility]

## 2023-02-07 NOTE — Assessment & Plan Note (Addendum)
02-07-2023 continue coreg 12.5 mg bid. 02-08-2023 stable. Continue coreg 02-09-2023 stable. 02-10-2023 wife states pt has a cough from HTN meds. Pt on coreg for BP and afib. Changing to toprol-xl will also likely give him cough. Wife agreeable to staying on coreg.  02-11-2023 hold parameters placed on coreg. Hold for SBP <120 or HR <60  02-12-2023 occ bradycardia with HR in the 50s

## 2023-02-07 NOTE — Progress Notes (Addendum)
PROGRESS NOTE    [name].  ZOX:[id number] DOB: [birth year] DOA: 02/03/2023 PCP: [name], FNP  Subjective: Pt seen and examined. Was called to bedside due to altered mental status. CBG noted to be 68. Pt given 1/2 amp D50. Pt was altered. Vascular surgery was at bedside. [name] family member at bedside    Hospital Course: HPI: [name]. is a 52 y.o. male with medical history significant of type 2 diabetes mellitus, acute osteomyelitis of toe of right foot with gangrene status post amputation of the great toe on the right foot, paroxysmal atrial fibrillation not on anticoagulation, hypertension, ESRD on HD, seizure disorder, BPH, diabetic polyneuropathy, GERD, gastroparesis, hypertension was sent to ED from podiatry office.  Per patient and his wife who is at the bedside, patient's wife noted a wound on his right foot on Sunday.  He called his podiatrist/Dr. Wagoner and was given an appointment to see today.  Being seen in the podiatry office today, he was noted to have significant pus and infected wound, he was directed to the emergency department for further workup and admission.  Patient denies any complaint, no pain.  Per wife, patient typically does not notice things himself until she does.  No reports of fever, chills, sweating, nausea, vomiting any other complaint or any foot trauma, any recent travel or sick contact.   ED Course: Hemodynamically stable upon arrival.  X-ray right foot unremarkable.  MRI ordered and pending.  Patient received cefepime, Flagyl and vancomycin in the ED and nephrology was consulted for dialysis and hospitalist were consulted for admission.  Significant Events: Admitted 02/03/2023 for diabetic right foot ulcer with concern for acute osteomyelitis   Significant Labs: Admission WBC 22.3, Bicarb 13, BUN 122, Scr 18.57  Significant Imaging Studies: MRI Right foot Substantial fluid/edema tracking within along the quadratus plantae,  abductor hallucis, flexor digitorum brevis, third and fourth plantar and dorsal interosseous, and abductor digiti minimi muscles, confluent with subcutaneous edema which is primarily infiltrative but which may have small fluid pockets tracking dorsal lateral to the fifth metatarsal, dorsal to the fourth metatarsal and into the fourth toe, and in the plantar subcutaneous tissues below the distal fourth and fifth metatarsals. Appearance concerning for active infection. 2. Cortical irregularity and mild marrow edema in the head of the fifth metatarsal suspicious for early osteomyelitis. 3. Previous small toe amputation at the MTP joint.  Antibiotic Therapy: Anti-infectives (From admission, onward)    Start     Dose/Rate Route Frequency Ordered Stop   02/08/23 1800  vancomycin (VANCOREADY) IVPB 750 mg/150 mL  Status:  Discontinued        750 mg 150 mL/hr over 60 Minutes Intravenous Every T-Th-Sa (Hemodialysis) 02/04/23 2042 02/07/23 1436   02/07/23 1530  vancomycin (VANCOREADY) IVPB 750 mg/150 mL        750 mg 150 mL/hr over 60 Minutes Intravenous Every M-W-F (Hemodialysis) 02/07/23 1436     02/05/23 0100  vancomycin (VANCOREADY) IVPB 750 mg/150 mL        750 mg 150 mL/hr over 60 Minutes Intravenous  Once 02/04/23 2042 02/05/23 0509   02/04/23 1800  ceFEPIme (MAXIPIME) 1 g in sodium chloride 0.9 % 100 mL IVPB        1 g 200 mL/hr over 30 Minutes Intravenous Every 24 hours 02/03/23 1401     12 /12/24 1330  vancomycin (VANCOCIN) IVPB 1000 mg/200 mL premix       Placed in "Followed by" Linked Group   1,000 mg  200 mL/hr over 60 Minutes Intravenous  Once 02/03/23 1224 02/03/23 1513   02/03/23 1230  metroNIDAZOLE (FLAGYL) IVPB 500 mg        500 mg 100 mL/hr over 60 Minutes Intravenous  Once 02/03/23 1221 02/03/23 1402   02/03/23 1230  ceFEPIme (MAXIPIME) 2 g in sodium chloride 0.9 % 100 mL IVPB        2 g 200 mL/hr over 30 Minutes Intravenous  Once 02/03/23 1224 02/03/23 1352   02/03/23 1230   vancomycin (VANCOCIN) IVPB 1000 mg/200 mL premix       Placed in "Followed by" Linked Group   1,000 mg 200 mL/hr over 60 Minutes Intravenous  Once 02/03/23 1224 02/03/23 1412       Procedures:   Consultants: Nephrology Vascular surgery orthopedics    Assessment and Plan: * Gangrene of right foot (HCC) On admission to 02-06-2023. Pt has osteomyelitis of right foot. Pt taken to OR on 02-04-2023 by podiatry. Pt had Partial fourth ray amputation, right foot.  Further resection of fifth metatarsal bone, right foot. [name] with orthopedics saw the patient and felt that pt had gangrene of the foot.  02-07-2023 pt will need right BKA by ortho. Planned on Wednesday Feb 09, 2023.  PAD (peripheral artery disease) (HCC) 02-07-2023 pt noted to hypoglycemic today. Angiogram canceled.  Angiogram to make sure that he doesn't need left BKA at the same time.  History of CVA (cerebrovascular accident) Chronic.  Coronary artery disease 02-07-2023 stable.  ESRD on hemodialysis (HCC) 02-07-2023 continue with HD per nephrology  Anemia due to chronic kidney disease, on chronic dialysis (HCC) 02-07-2023 stable.  Paroxysmal atrial fibrillation (HCC) 02-07-2023 continue with amiodarone 200 mg qday  Uncontrolled type 2 diabetes mellitus with hyperglycemia, with long-term current use of insulin (HCC) 02-07-2023 CBG low today. Pt confused. Will give 1/2 amp D50 IV. Hypoglycemia may be due to pt being NPO for his angiogram today.  Essential hypertension, benign 02-07-2023 continue coreg 12.5 mg bid.   DVT prophylaxis: Place and maintain sequential compression device Start: 02/05/23 1515     Code Status: Full Code Family Communication: discussed with pt's family member at bedside Disposition Plan: unknown but may need SNF at discharge Reason for continuing need for hospitalization: awaiting BKA, angiogram.  Objective: Vitals:   02/07/23 1153 02/07/23 1158 02/07/23 1200 02/07/23 1400  BP:  (!) 91/48 (!) 94/51 108/79 (!) 113/32  Pulse: (!) 118 (!) 108 (!) 111 67  Resp: 12 11 13 19   Temp: 97.8 F (36.6 C)   97.8 F (36.6 C)  TempSrc: Oral   Oral  SpO2: 99% 99% 97% 100%  Weight:   79.3 kg   Height:        Intake/Output Summary (Last 24 hours) at 02/07/2023 1522 Last data filed at 02/07/2023 1204 Gross per 24 hour  Intake --  Output -500 ml  Net 500 ml   Filed Weights   02/07/23 0354 02/07/23 0758 02/07/23 1200  Weight: 81 kg 79.1 kg 79.3 kg    Examination:  Physical Exam Vitals and nursing note reviewed.  Constitutional:      Comments: Altered Chronically ill appearing  HENT:     Head: Normocephalic.  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  Pulmonary:     Effort: Pulmonary effort is normal.     Breath sounds: Normal breath sounds.  Abdominal:     General: Bowel  sounds are normal.     Palpations: Abdomen is soft.  Musculoskeletal:     Comments: Both feet wrapped in dressings.     Data Reviewed: I have personally reviewed following labs and imaging studies  CBC: Recent Labs  Lab 02/03/23 1115 02/04/23 0331 02/05/23 0620 02/07/23 0318  WBC 22.3* 22.5* 22.6* 17.9*  NEUTROABS 19.5*  --   --   --   HGB 8.9* 8.5* 8.5* 8.1*  HCT 28.7* 26.4* 25.3* 25.4*  MCV 73.4* 69.8* 69.1* 70.8*  PLT 203 206 209 224   Basic Metabolic Panel: Recent Labs  Lab 02/03/23 1115 02/03/23 1120 02/04/23 0331 02/05/23 0620 02/07/23 0318  NA 141  --  138 139 135  K 4.4  --  4.6 3.2* 4.0  CL 104  --  104 101 101  CO2 13*  --  15* 22 19*  GLUCOSE 82  --  99 90 103*  BUN 122*  --  132* 65* 105*  CREATININE 18.57*  --  18.70* 10.81* 14.30*  CALCIUM 9.6  --  8.8* 8.9 8.7*  MG  --  2.4  --  1.8  --   PHOS  --   --   --  5.5*  --    GFR: Estimated Creatinine Clearance: 6.6 mL/min (A) (by C-G formula based on SCr of 14.3 mg/dL (H)). Liver Function Tests: Recent Labs  Lab 02/03/23 1115  AST 12*   ALT 10  ALKPHOS 65  BILITOT 0.9  PROT 6.9  ALBUMIN 2.7*   CBG: Recent Labs  Lab 02/04/23 1014 02/04/23 1242 02/04/23 1702 02/07/23 1415 02/07/23 1448  GLUCAP 97 86 97 68* 141*   Sepsis Labs: Recent Labs  Lab 02/03/23 1256  LATICACIDVEN 1.1    Recent Results (from the past 240 hours)  Culture, blood (routine x 2)     Status: None (Preliminary result)   Collection Time: 02/03/23 12:22 PM   Specimen: BLOOD RIGHT ARM  Result Value Ref Range Status   Specimen Description BLOOD RIGHT ARM  Final   Special Requests   Final    BOTTLES DRAWN AEROBIC AND ANAEROBIC Blood Culture results may not be optimal due to an inadequate volume of blood received in culture bottles   Culture   Final    NO GROWTH 4 DAYS Performed at [facility], [address], [place], [place] [address]    Report Status PENDING  Incomplete  Culture, blood (routine x 2)     Status: None (Preliminary result)   Collection Time: 02/03/23 12:27 PM   Specimen: BLOOD RIGHT HAND  Result Value Ref Range Status   Specimen Description BLOOD RIGHT HAND  Final   Special Requests   Final    BOTTLES DRAWN AEROBIC AND ANAEROBIC Blood Culture results may not be optimal due to an inadequate volume of blood received in culture bottles   Culture   Final    NO GROWTH 4 DAYS Performed at [facility], [address], [place], [place] [address]    Report Status PENDING  Incomplete  Aerobic/Anaerobic Culture w Gram Stain (surgical/deep wound)     Status: None (Preliminary result)   Collection Time: 02/04/23 12:06 PM   Specimen: Foot, Right; Tissue  Result Value Ref Range Status   Specimen Description TISSUE  Final   Special Requests RIGHT FOOT  Final   Gram Stain   Final    FEW WBC PRESENT, PREDOMINANTLY PMN ABUNDANT GRAM POSITIVE COCCI IN PAIRS RARE GRAM NEGATIVE COCCOBACILLI  Performed at [facility], [address] [place], [place] [address]    Culture   Final    ABUNDANT STAPHYLOCOCCUS  AUREUS CULTURE REINCUBATED FOR BETTER GROWTH NO ANAEROBES ISOLATED; CULTURE IN PROGRESS FOR 5 DAYS    Report Status PENDING  Incomplete  Aerobic/Anaerobic Culture w Gram Stain (surgical/deep wound)     Status: None (Preliminary result)   Collection Time: 02/04/23 12:06 PM   Specimen: Foot, Right; Wound  Result Value Ref Range Status   Specimen Description WOUND  Final   Special Requests RIGHT FOOT  Final   Gram Stain   Final    MODERATE WBC PRESENT, PREDOMINANTLY PMN MODERATE GRAM POSITIVE COCCI Performed at [facility], [address], [place], [place] [address]    Culture   Final    ABUNDANT STAPHYLOCOCCUS AUREUS CULTURE REINCUBATED FOR BETTER GROWTH NO ANAEROBES ISOLATED; CULTURE IN PROGRESS FOR 5 DAYS    Report Status PENDING  Incomplete     Radiology Studies: No results found.  Scheduled Meds:  amiodarone  200 mg Oral Daily   amphetamine-dextroamphetamine  5 mg Oral Daily   atorvastatin  80 mg Oral QHS   calcium acetate  1,334 mg Oral TID WC   carvedilol  12.5 mg Oral BID   Chlorhexidine Gluconate Cloth  6 each Topical Q0600   Chlorhexidine Gluconate Cloth  6 each Topical Q0600   dorzolamide-timolol  1 drop Right Eye BID   sodium chloride flush  3 mL Intravenous Q12H   Continuous Infusions:  ceFEPime (MAXIPIME) IV 1 g (02/06/23 1751)   [START ON 02/09/2023] levETIRAcetam     levETIRAcetam 500 mg (02/07/23 1454)   vancomycin       LOS: 4 days   Time spent: 40 minutes  Carollee Herter, DO  [facility]  02/07/2023, 3:22 PM

## 2023-02-07 NOTE — Progress Notes (Signed)
Received patient in bed to unit.  Alert and oriented.  Informed consent signed and in chart.   TX duration:3.5  Patient didn't tolerate tx with uf. Uf off majority of tx due to low bp. Dr. Arlean Hopping aware.  Transported back to the room  Alert, without acute distress.  Hand-off given to patient's nurse.   Access used: left AVF Access issues: none  Total UF removed: - Medication(s) given: none   02/07/23 1153  Vitals  Temp 97.8 F (36.6 C)  Temp Source Oral  BP (!) 91/48  MAP (mmHg) (!) 61  BP Location Right Arm  BP Method Automatic  Patient Position (if appropriate) Lying  Pulse Rate (!) 118  Pulse Rate Source Monitor  ECG Heart Rate (!) 115  Resp 12  Oxygen Therapy  SpO2 99 %  O2 Device Room Air  During Treatment Monitoring  Blood Flow Rate (mL/min) 350 mL/min  Arterial Pressure (mmHg) -115.95 mmHg  Venous Pressure (mmHg) 185.04 mmHg  TMP (mmHg) -3.43 mmHg  Ultrafiltration Rate (mL/min) 0 mL/min  Dialysate Flow Rate (mL/min) 300 ml/min  Dialysate Potassium Concentration 3  Dialysate Calcium Concentration 2.5  Duration of HD Treatment -hour(s) 3.49 hour(s)  Cumulative Fluid Removed (mL) per Treatment  -292.76  HD Safety Checks Performed Yes  Intra-Hemodialysis Comments Tx completed  Dialysis Fluid Bolus Normal Saline  Bolus Amount (mL) 500 mL    Murry Khiev S [facility]

## 2023-02-07 NOTE — Assessment & Plan Note (Addendum)
02-07-2023 pt noted to hypoglycemic today. Angiogram canceled.  Angiogram to make sure that he doesn't need left BKA at the same time. 02-08-2023 angiogram today to see if he will need left lower extremity surgery.   02-09-2023 angiogram on 02-08-2023 shows blood flow to left foot. "Inline flow to the foot with three-vessel runoff bilaterally but with microvascular disease and minimal filling pedal vessels bilaterally."  02-10-2023 remains on lipitor. Will need as least ASA 81 mg daily at discharge 02-11-2023 start ASA 81 mg daily. 02-12-2023, 02-13-2023 continue ASA 81 mg daily.

## 2023-02-07 NOTE — Assessment & Plan Note (Addendum)
02-07-2023 stable. 02-11-2023 restart ASA 81 mg daily. On lipitor 80 mg at bedtime. On coreg 12.5 mg bid 02-13-2023 stable. On ASA 81 mg daily, lipitor 80 mg at bedtime, coreg 12.5 mg bid.

## 2023-02-07 NOTE — Anesthesia Postprocedure Evaluation (Signed)
Anesthesia Post Note  Patient: [name].  Procedure(s) Performed: AMPUTATION 4TH AND 5TH METATARSAL (Right)     Patient location during evaluation: PACU Anesthesia Type: Regional Level of consciousness: awake and alert Pain management: pain level controlled Vital Signs Assessment: post-procedure vital signs reviewed and stable Respiratory status: spontaneous breathing, nonlabored ventilation, respiratory function stable and patient connected to nasal cannula oxygen Cardiovascular status: stable and blood pressure returned to baseline Postop Assessment: no apparent nausea or vomiting Anesthetic complications: no   No notable events documented.  Last Vitals:    Last Pain:                 Collene Schlichter

## 2023-02-08 ENCOUNTER — Encounter (HOSPITAL_COMMUNITY)
Admission: EM | Disposition: A | Discharge status: Skilled Nursing Facility | Payer: Self-pay | Source: Ambulatory Visit | Attending: Family Medicine

## 2023-02-08 DIAGNOSIS — I10 Essential (primary) hypertension: Secondary | ICD-10-CM | POA: Diagnosis not present

## 2023-02-08 DIAGNOSIS — I739 Peripheral vascular disease, unspecified: Secondary | ICD-10-CM | POA: Diagnosis not present

## 2023-02-08 DIAGNOSIS — E1165 Type 2 diabetes mellitus with hyperglycemia: Secondary | ICD-10-CM | POA: Diagnosis not present

## 2023-02-08 DIAGNOSIS — L97413 Non-pressure chronic ulcer of right heel and midfoot with necrosis of muscle: Secondary | ICD-10-CM | POA: Diagnosis not present

## 2023-02-08 DIAGNOSIS — N186 End stage renal disease: Secondary | ICD-10-CM | POA: Diagnosis not present

## 2023-02-08 HISTORY — PX: ABDOMINAL AORTOGRAM W/LOWER EXTREMITY: CATH118223

## 2023-02-08 LAB — CULTURE, BLOOD (ROUTINE X 2)
Culture: NO GROWTH
Culture: NO GROWTH

## 2023-02-08 LAB — GLUCOSE, CAPILLARY: Glucose-Capillary: 89 mg/dL (ref 70–99)

## 2023-02-08 LAB — SURGICAL PCR SCREEN
MRSA, PCR: NEGATIVE
Staphylococcus aureus: POSITIVE — AB

## 2023-02-08 LAB — HEPATITIS B SURFACE ANTIBODY, QUANTITATIVE: Hep B S AB Quant (Post): 16.2 m[IU]/mL

## 2023-02-08 SURGERY — ABDOMINAL AORTOGRAM W/LOWER EXTREMITY
Anesthesia: LOCAL | Laterality: Bilateral

## 2023-02-08 MED ORDER — CHLORHEXIDINE GLUCONATE CLOTH 2 % EX PADS: 6.0000 | MEDICATED_PAD | Freq: Every day | CUTANEOUS | Status: DC

## 2023-02-08 MED ORDER — LABETALOL HCL 5 MG/ML IV SOLN: 10.0000 mg | INTRAVENOUS | Status: DC | PRN

## 2023-02-08 MED ORDER — SODIUM CHLORIDE 0.9% FLUSH
3.0000 mL | Freq: Two times a day (BID) | INTRAVENOUS | Status: DC
  Administered 2023-02-08 – 2023-02-10 (×3): 3 mL via INTRAVENOUS

## 2023-02-08 MED ORDER — IODIXANOL 320 MG/ML IV SOLN
INTRAVENOUS | Status: DC | PRN
  Administered 2023-02-08: 95 mL

## 2023-02-08 MED ORDER — TRANEXAMIC ACID 1000 MG/10ML IV SOLN
2000.0000 mg | INTRAVENOUS | Status: DC
  Filled 2023-02-08: qty 20

## 2023-02-08 MED ORDER — CEFAZOLIN SODIUM-DEXTROSE 2-4 GM/100ML-% IV SOLN
2.0000 g | INTRAVENOUS | Status: DC
  Filled 2023-02-08: qty 100

## 2023-02-08 MED ORDER — TRANEXAMIC ACID-NACL 1000-0.7 MG/100ML-% IV SOLN
1000.0000 mg | INTRAVENOUS | Status: DC
  Filled 2023-02-08: qty 100

## 2023-02-08 MED ORDER — ONDANSETRON HCL 4 MG/2ML IJ SOLN
4.0000 mg | Freq: Four times a day (QID) | INTRAMUSCULAR | Status: DC | PRN
Start: 2023-02-08 — End: 2023-02-23

## 2023-02-08 MED ORDER — LIDOCAINE HCL (PF) 1 % IJ SOLN
INTRAMUSCULAR | Status: AC
  Filled 2023-02-08: qty 30

## 2023-02-08 MED ORDER — CHLORHEXIDINE GLUCONATE 4 % EX SOLN: 60.0000 mL | Freq: Once | CUTANEOUS | Status: DC

## 2023-02-08 MED ORDER — MUPIROCIN 2 % EX OINT
1.0000 | TOPICAL_OINTMENT | Freq: Two times a day (BID) | CUTANEOUS | Status: AC
  Administered 2023-02-09 – 2023-02-13 (×8): 1 via NASAL
  Filled 2023-02-08 (×2): qty 22

## 2023-02-08 MED ORDER — LIDOCAINE HCL (PF) 1 % IJ SOLN
INTRAMUSCULAR | Status: DC | PRN
  Administered 2023-02-08: 15 mL via INTRADERMAL

## 2023-02-08 MED ORDER — HEPARIN (PORCINE) IN NACL 1000-0.9 UT/500ML-% IV SOLN
INTRAVENOUS | Status: DC | PRN
  Administered 2023-02-08 (×2): 500 mL

## 2023-02-08 MED ORDER — SODIUM CHLORIDE 0.9 % IV SOLN: 250.0000 mL | INTRAVENOUS | Status: AC | PRN

## 2023-02-08 MED ORDER — POVIDONE-IODINE 10 % EX SWAB: 2.0000 | Freq: Once | CUTANEOUS | Status: DC

## 2023-02-08 MED ORDER — DEXTROSE IN LACTATED RINGERS 5 % IV SOLN: INTRAVENOUS | Status: AC

## 2023-02-08 MED ORDER — HYDRALAZINE HCL 20 MG/ML IJ SOLN
5.0000 mg | INTRAMUSCULAR | Status: DC | PRN
  Administered 2023-02-20: 5 mg via INTRAVENOUS
  Filled 2023-02-08: qty 1

## 2023-02-08 MED ORDER — ACETAMINOPHEN 325 MG PO TABS
650.0000 mg | ORAL_TABLET | ORAL | Status: DC | PRN
  Administered 2023-02-08: 650 mg via ORAL

## 2023-02-08 MED ORDER — FENTANYL CITRATE (PF) 100 MCG/2ML IJ SOLN
INTRAMUSCULAR | Status: AC
  Filled 2023-02-08: qty 2

## 2023-02-08 MED ORDER — SODIUM CHLORIDE 0.9% FLUSH: 3.0000 mL | INTRAVENOUS | Status: DC | PRN

## 2023-02-08 MED ORDER — MIDAZOLAM HCL 2 MG/2ML IJ SOLN
INTRAMUSCULAR | Status: AC
  Filled 2023-02-08: qty 2

## 2023-02-08 SURGICAL SUPPLY — 11 items
CATH OMNI FLUSH 5F 65CM (CATHETERS) IMPLANT
CLOSURE MYNX CONTROL 5F (Vascular Products) IMPLANT
COVER DOME SNAP 22 D (MISCELLANEOUS) IMPLANT
GLIDEWIRE ADV .035X260CM (WIRE) IMPLANT
KIT MICROPUNCTURE NIT STIFF (SHEATH) IMPLANT
KIT SYRINGE INJ CVI SPIKEX1 (MISCELLANEOUS) IMPLANT
SET ATX-X65L (MISCELLANEOUS) IMPLANT
SHEATH PINNACLE 5F 10CM (SHEATH) IMPLANT
SHEATH PROBE COVER 6X72 (BAG) IMPLANT
TRAY PV CATH (CUSTOM PROCEDURE TRAY) ×1 IMPLANT
WIRE STARTER BENTSON 035X150 (WIRE) IMPLANT

## 2023-02-08 NOTE — Progress Notes (Signed)
During AM pt was unresponsive. CT head completed this shift. 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

## 2023-02-08 NOTE — Progress Notes (Signed)
PROGRESS NOTE    [name].  GEX:[id number] DOB: 01-25-1971 DOA: 02/03/2023 PCP: [name], FNP  Subjective: Pt seen and examined.  Wife at bedside. Pt is much more awake and alert. Wife states pt is much more like himself today. Remains on IVF with dextrose. NPO for angiogram.   Hospital Course: HPI: [name] Kentrel Mayle. is a 52 y.o. male with medical history significant of type 2 diabetes mellitus, acute osteomyelitis of toe of right foot with gangrene status post amputation of the great toe on the right foot, paroxysmal atrial fibrillation not on anticoagulation, hypertension, ESRD on HD, seizure disorder, BPH, diabetic polyneuropathy, GERD, gastroparesis, hypertension was sent to ED from podiatry office.  Per patient and his wife who is at the bedside, patient's wife noted a wound on his right foot on Sunday.  He called his podiatrist/Dr. Wagoner and was given an appointment to see today.  Being seen in the podiatry office today, he was noted to have significant pus and infected wound, he was directed to the emergency department for further workup and admission.  Patient denies any complaint, no pain.  Per wife, patient typically does not notice things himself until she does.  No reports of fever, chills, sweating, nausea, vomiting any other complaint or any foot trauma, any recent travel or sick contact.   ED Course: Hemodynamically stable upon arrival.  X-ray right foot unremarkable.  MRI ordered and pending.  Patient received cefepime, Flagyl and vancomycin in the ED and nephrology was consulted for dialysis and hospitalist were consulted for admission.  Significant Events: Admitted 02/03/2023 for diabetic right foot ulcer with concern for acute osteomyelitis   Significant Labs: Admission WBC 22.3, Bicarb 13, BUN 122, Scr 18.57  Significant Imaging Studies: MRI Right foot Substantial fluid/edema tracking within along the quadratus plantae, abductor hallucis, flexor  digitorum brevis, third and fourth plantar and dorsal interosseous, and abductor digiti minimi muscles, confluent with subcutaneous edema which is primarily infiltrative but which may have small fluid pockets tracking dorsal lateral to the fifth metatarsal, dorsal to the fourth metatarsal and into the fourth toe, and in the plantar subcutaneous tissues below the distal fourth and fifth metatarsals. Appearance concerning for active infection. 2. Cortical irregularity and mild marrow edema in the head of the fifth metatarsal suspicious for early osteomyelitis. 3. Previous small toe amputation at the MTP joint.  Antibiotic Therapy: Anti-infectives (From admission, onward)    Start     Dose/Rate Route Frequency Ordered Stop   02/08/23 1800  vancomycin (VANCOREADY) IVPB 750 mg/150 mL  Status:  Discontinued        750 mg 150 mL/hr over 60 Minutes Intravenous Every T-Th-Sa (Hemodialysis) 02/04/23 2042 02/07/23 1436   02/07/23 1530  vancomycin (VANCOREADY) IVPB 750 mg/150 mL        750 mg 150 mL/hr over 60 Minutes Intravenous Every M-W-F (Hemodialysis) 02/07/23 1436     02/05/23 0100  vancomycin (VANCOREADY) IVPB 750 mg/150 mL        750 mg 150 mL/hr over 60 Minutes Intravenous  Once 02/04/23 2042 02/05/23 0509   02/04/23 1800  ceFEPIme (MAXIPIME) 1 g in sodium chloride 0.9 % 100 mL IVPB        1 g 200 mL/hr over 30 Minutes Intravenous Every 24 hours 02/03/23 1401     12 /12/24 1330  vancomycin (VANCOCIN) IVPB 1000 mg/200 mL premix       Placed in "Followed by" Linked Group   1,000 mg 200 mL/hr over 60 Minutes  Intravenous  Once 02/03/23 1224 02/03/23 1513   02/03/23 1230  metroNIDAZOLE (FLAGYL) IVPB 500 mg        500 mg 100 mL/hr over 60 Minutes Intravenous  Once 02/03/23 1221 02/03/23 1402   02/03/23 1230  ceFEPIme (MAXIPIME) 2 g in sodium chloride 0.9 % 100 mL IVPB        2 g 200 mL/hr over 30 Minutes Intravenous  Once 02/03/23 1224 02/03/23 1352   02/03/23 1230  vancomycin (VANCOCIN) IVPB 1000  mg/200 mL premix       Placed in "Followed by" Linked Group   1,000 mg 200 mL/hr over 60 Minutes Intravenous  Once 02/03/23 1224 02/03/23 1412       Procedures:   Consultants: Nephrology Vascular surgery orthopedics    Assessment and Plan: * Gangrene of right foot (HCC) On admission to 02-06-2023. Pt has osteomyelitis of right foot. Pt taken to OR on 02-04-2023 by podiatry. Pt had Partial fourth ray amputation, right foot.  Further resection of fifth metatarsal bone, right foot. [name] with orthopedics saw the patient and felt that pt had gangrene of the foot.  02-07-2023 pt will need right BKA by ortho. Planned on Wednesday Feb 09, 2023. 02-08-2023 angiogram today to see if he will need left lower extremity surgery.  PAD (peripheral artery disease) (HCC) 02-07-2023 pt noted to hypoglycemic today. Angiogram canceled.  Angiogram to make sure that he doesn't need left BKA at the same time. 02-08-2023 angiogram today to see if he will need left lower extremity surgery.   History of CVA (cerebrovascular accident) Chronic.  Coronary artery disease 02-07-2023 stable.  ESRD on hemodialysis (HCC) 02-07-2023, 02-08-2023 continue with HD per nephrology  Anemia due to chronic kidney disease, on chronic dialysis (HCC) 02-07-2023 stable.  Paroxysmal atrial fibrillation (HCC) 02-07-2023 continue with amiodarone 200 mg qday 02-08-2023 off systemic anticoagulation in preparation BKA  Uncontrolled type 2 diabetes mellitus with hyperglycemia, with long-term current use of insulin (HCC) 02-07-2023 CBG low today. Pt confused. Will give 1/2 amp D50 IV. Hypoglycemia may be due to pt being NPO for his angiogram today. 02-08-2023 diabetic hypoglycemia improved with IVF with dextrose.  Essential hypertension, benign 02-07-2023 continue coreg 12.5 mg bid. 02-08-2023 stable. Continue coreg   DVT prophylaxis: Place and maintain sequential compression device Start: 02/05/23 1515    Code  Status: Full Code Family Communication: discussed with pt's wife at bedside Disposition Plan: unknown Reason for continuing need for hospitalization: angiogram today. Right BKA tomorrow.  Objective: Vitals:   02/08/23 0800 02/08/23 1002 02/08/23 1308 02/08/23 1447  BP: 127/61 (!) 115/57 118/63   Pulse: (!) 50 64 (!) 58   Resp: 16 15 14    Temp:  98.4 F (36.9 C) 98.3 F (36.8 C)   TempSrc:  Oral Axillary   SpO2: 93% 99% 100% 99%  Weight:      Height:        Intake/Output Summary (Last 24 hours) at 02/08/2023 1456 Last data filed at 02/08/2023 0302 Gross per 24 hour  Intake 760.06 ml  Output --  Net 760.06 ml   Filed Weights   02/07/23 0758 02/07/23 1200 02/08/23 0439  Weight: 79.1 kg 79.3 kg 78.5 kg    Examination:  Physical Exam Vitals and nursing note reviewed.  Constitutional:      General: He is not in acute distress.    Appearance: He is not toxic-appearing.  HENT:     Head: Normocephalic and atraumatic.  Eyes:     Comments: Missing  his left eye  Cardiovascular:     Rate and Rhythm: Normal rate and regular rhythm.  Pulmonary:     Effort: Pulmonary effort is normal.  Abdominal:     General: Abdomen is flat. Bowel sounds are normal.  Skin:    Capillary Refill: Capillary refill takes less than 2 seconds.     Comments: Bilateral feet wrapped in bulky dressing  Neurological:     Mental Status: He is alert.     Data Reviewed: I have personally reviewed following labs and imaging studies  CBC: Recent Labs  Lab 02/03/23 1115 02/04/23 0331 02/05/23 0620 02/07/23 0318  WBC 22.3* 22.5* 22.6* 17.9*  NEUTROABS 19.5*  --   --   --   HGB 8.9* 8.5* 8.5* 8.1*  HCT 28.7* 26.4* 25.3* 25.4*  MCV 73.4* 69.8* 69.1* 70.8*  PLT 203 206 209 224   Basic Metabolic Panel: Recent Labs  Lab 02/03/23 1115 02/03/23 1120 02/04/23 0331 02/05/23 0620 02/07/23 0318  NA 141  --  138 139 135  K 4.4  --  4.6 3.2* 4.0  CL 104  --  104 101 101  CO2 13*  --  15* 22 19*   GLUCOSE 82  --  99 90 103*  BUN 122*  --  132* 65* 105*  CREATININE 18.57*  --  18.70* 10.81* 14.30*  CALCIUM 9.6  --  8.8* 8.9 8.7*  MG  --  2.4  --  1.8  --   PHOS  --   --   --  5.5*  --    GFR: Estimated Creatinine Clearance: 6.6 mL/min (A) (by C-G formula based on SCr of 14.3 mg/dL (H)). Liver Function Tests: Recent Labs  Lab 02/03/23 1115  AST 12*  ALT 10  ALKPHOS 65  BILITOT 0.9  PROT 6.9  ALBUMIN 2.7*   CBG: Recent Labs  Lab 02/07/23 1415 02/07/23 1448 02/07/23 1643 02/07/23 2132 02/08/23 1406  GLUCAP 68* 141* 90 113* 89   Sepsis Labs: Recent Labs  Lab 02/03/23 1256  LATICACIDVEN 1.1    Recent Results (from the past 240 hours)  Culture, blood (routine x 2)     Status: None   Collection Time: 02/03/23 12:22 PM   Specimen: BLOOD RIGHT ARM  Result Value Ref Range Status   Specimen Description BLOOD RIGHT ARM  Final   Special Requests   Final    BOTTLES DRAWN AEROBIC AND ANAEROBIC Blood Culture results may not be optimal due to an inadequate volume of blood received in culture bottles   Culture   Final    NO GROWTH 5 DAYS Performed at [facility], [address] [place], [place] [address]    Report Status 02/08/2023 FINAL  Final  Culture, blood (routine x 2)     Status: None   Collection Time: 02/03/23 12:27 PM   Specimen: BLOOD RIGHT HAND  Result Value Ref Range Status   Specimen Description BLOOD RIGHT HAND  Final   Special Requests   Final    BOTTLES DRAWN AEROBIC AND ANAEROBIC Blood Culture results may not be optimal due to an inadequate volume of blood received in culture bottles   Culture   Final    NO GROWTH 5 DAYS Performed at [facility], [address] [place], [place] [address]    Report Status 02/08/2023 FINAL  Final  Aerobic/Anaerobic Culture w Gram Stain (surgical/deep wound)     Status: None (Preliminary result)   Collection Time: 02/04/23 12:06  PM   Specimen: Foot, Right; Tissue  Result Value Ref Range Status    Specimen Description TISSUE  Final   Special Requests RIGHT FOOT  Final   Gram Stain   Final    FEW WBC PRESENT, PREDOMINANTLY PMN ABUNDANT GRAM POSITIVE COCCI IN PAIRS RARE GRAM NEGATIVE COCCOBACILLI Performed at [facility], [address], [place], [place] [address]    Culture   Final    ABUNDANT STAPHYLOCOCCUS AUREUS MODERATE CORYNEBACTERIUM STRIATUM Standardized susceptibility testing for this organism is not available. NO ANAEROBES ISOLATED; CULTURE IN PROGRESS FOR 5 DAYS    Report Status PENDING  Incomplete   Organism ID, Bacteria STAPHYLOCOCCUS AUREUS  Final      Susceptibility   Staphylococcus aureus - MIC*    CIPROFLOXACIN <=0.5 SENSITIVE Sensitive     ERYTHROMYCIN RESISTANT Resistant     GENTAMICIN <=0.5 SENSITIVE Sensitive     OXACILLIN <=0.25 SENSITIVE Sensitive     TETRACYCLINE <=1 SENSITIVE Sensitive     VANCOMYCIN 1 SENSITIVE Sensitive     TRIMETH/SULFA <=10 SENSITIVE Sensitive     CLINDAMYCIN RESISTANT Resistant     RIFAMPIN <=0.5 SENSITIVE Sensitive     Inducible Clindamycin POSITIVE Resistant     LINEZOLID 2 SENSITIVE Sensitive     * ABUNDANT STAPHYLOCOCCUS AUREUS  Aerobic/Anaerobic Culture w Gram Stain (surgical/deep wound)     Status: None (Preliminary result)   Collection Time: 02/04/23 12:06 PM   Specimen: Foot, Right; Wound  Result Value Ref Range Status   Specimen Description WOUND  Final   Special Requests RIGHT FOOT  Final   Gram Stain   Final    MODERATE WBC PRESENT, PREDOMINANTLY PMN MODERATE GRAM POSITIVE COCCI Performed at [facility], [address], [place], [place] [address]    Culture   Final    ABUNDANT STAPHYLOCOCCUS AUREUS SUSCEPTIBILITIES PERFORMED ON PREVIOUS CULTURE WITHIN THE LAST 5 DAYS. MODERATE CORYNEBACTERIUM STRIATUM Standardized susceptibility testing for this organism is not available. NO ANAEROBES ISOLATED; CULTURE IN PROGRESS FOR 5 DAYS    Report Status PENDING  Incomplete     Radiology Studies: CT  HEAD WO CONTRAST ( ) Result Date: 02/07/2023 CLINICAL DATA:  Delirium EXAM: CT HEAD WITHOUT CONTRAST TECHNIQUE: Contiguous axial images were obtained from the base of the skull through the vertex without intravenous contrast. RADIATION DOSE REDUCTION: This exam was performed according to the departmental dose-optimization program which includes automated exposure control, adjustment of the mA and/or kV according to patient size and/or use of iterative reconstruction technique. COMPARISON:  None Available. FINDINGS: Brain: Bilateral chronic superior frontal lobe infarcts. No mass, hemorrhage or extra-axial collection. No hydrocephalus. Vascular: Calcific atherosclerosis at the skull base. Skull: Negative Sinuses/Orbits: Paranasal sinuses are clear.  Absent left lobe. Other: None IMPRESSION: 1. No acute intracranial abnormality. 2. Chronic bilateral superior frontal lobe infarcts. Electronically Signed   By: Deatra Robinson M.D.   On: 02/07/2023 23:23   VAS [place] ABI WITH/WO TBI Result Date: 02/07/2023  LOWER EXTREMITY DOPPLER STUDY Patient Name:  [name].  Date of Exam:   02/07/2023 Medical Rec #: [id number]             Accession #:    [id number] Date of Birth: 1970-09-21              Patient Gender: M Patient Age:   61 years Exam Location:  [facility] Procedure:      VAS [place] ABI WITH/WO TBI Referring Phys: Marlin Canary --------------------------------------------------------------------------------  Indications:  PVD, A-fib High Risk Factors: Hypertension, Diabetes.  Comparison Study: Previous study on 1.23.2018. Performing Technologist: Fernande Bras  Examination Guidelines: A complete evaluation includes at minimum, Doppler waveform signals and systolic blood pressure reading at the level of bilateral brachial, anterior tibial, and posterior tibial arteries, when vessel segments are accessible. Bilateral testing is considered an integral part of a complete examination. Photoelectric  Plethysmograph (PPG) waveforms and toe systolic pressure readings are included as required and additional duplex testing as needed. Limited examinations for reoccurring indications may be performed as noted.  ABI Findings: +---------+------------------+-----+----------+--------+ Right    Rt Pressure (mmHg)IndexWaveform  Comment  +---------+------------------+-----+----------+--------+ Brachial 141                    biphasic           +---------+------------------+-----+----------+--------+ PTA      255               1.81 monophasic         +---------+------------------+-----+----------+--------+ DP       255               1.81 triphasic          +---------+------------------+-----+----------+--------+ Great Toe0                 0.00 Abnormal           +---------+------------------+-----+----------+--------+ +---------+------------------+-----+----------+----------------+ Left     Lt Pressure (mmHg)IndexWaveform  Comment          +---------+------------------+-----+----------+----------------+ Brachial                                  Dialysis access. +---------+------------------+-----+----------+----------------+ PTA      255               1.81 monophasic                 +---------+------------------+-----+----------+----------------+ DP       154               1.09 triphasic                  +---------+------------------+-----+----------+----------------+ Great Toe0                 0.00 Abnormal                   +---------+------------------+-----+----------+----------------+ Arterial wall calcification precludes accurate ankle pressures and ABIs.  Summary: Right: Resting right ankle-brachial index indicates noncompressible right lower extremity arteries. The right toe-brachial index is abnormal. Left: Resting left ankle-brachial index indicates noncompressible left lower extremity arteries. The left toe-brachial index is abnormal. *See table(s) above  for measurements and observations.  Electronically signed by Gerarda Fraction on 02/07/2023 at 6:51:11 PM.    Final     Scheduled Meds:  [MAR Hold] amiodarone  200 mg Oral Daily   [MAR Hold] amphetamine-dextroamphetamine  5 mg Oral Daily   [MAR Hold] atorvastatin  80 mg Oral QHS   [MAR Hold] calcium acetate  1,334 mg Oral TID WC   [MAR Hold] carvedilol  12.5 mg Oral BID   [MAR Hold] Chlorhexidine Gluconate Cloth  6 each Topical Q0600   [MAR Hold] Chlorhexidine Gluconate Cloth  6 each Topical Q0600   [MAR Hold] dorzolamide-timolol  1 drop Right Eye BID   [MAR Hold] nystatin   Topical TID   [MAR Hold] sodium chloride flush  3 mL Intravenous Q12H  Continuous Infusions:  [MAR Hold] ceFEPime (MAXIPIME) IV 1 g (02/07/23 1821)   [MAR Hold] levETIRAcetam     [MAR Hold] levETIRAcetam 500 mg (02/08/23 0927)   [MAR Hold] vancomycin 750 mg (02/07/23 1620)     LOS: 5 days   Time spent: 40 minutes  Carollee Herter, DO  [facility]  02/08/2023, 2:56 PM

## 2023-02-08 NOTE — Progress Notes (Signed)
Patient has declined CBG checks this AM and lunchtime.  Cognition seems to have improved since yesterday, 12/16.  Treatment plan will continue.

## 2023-02-08 NOTE — Plan of Care (Signed)
  Problem: Education: Goal: Knowledge of General Education information will improve Description: Including pain rating scale, medication(s)/side effects and non-pharmacologic comfort measures Outcome: Progressing   Problem: Health Behavior/Discharge Planning: Goal: Ability to manage health-related needs will improve Outcome: Progressing   Problem: Clinical Measurements: Goal: Ability to maintain clinical measurements within normal limits will improve Outcome: Progressing Goal: Will remain free from infection Outcome: Progressing   Problem: Activity: Goal: Risk for activity intolerance will decrease Outcome: Progressing   Problem: Nutrition: Goal: Adequate nutrition will be maintained Outcome: Progressing   Problem: Coping: Goal: Level of anxiety will decrease Outcome: Progressing   Problem: Pain Management: Goal: General experience of comfort will improve Outcome: Progressing   Problem: Skin Integrity: Goal: Risk for impaired skin integrity will decrease Outcome: Progressing

## 2023-02-08 NOTE — Progress Notes (Signed)
Patient's wife was at bedside assisting patient with eating.  Patient started to cough violently.  Patient's facial expression was wide eyed, hands tremoring with seizure-like activity.  Patient's wife states this hasn't happened before but he does cough at meal times.  She feels it is due to his "blood pressure medication."  Patient recovered immediately, was able to speak and breathe.  Dr Imogene Burn informed and speech eval requested.

## 2023-02-08 NOTE — Progress Notes (Signed)
[facility] Progress Note  Subjective: seen in room. No c/o today. Going for LE angiogram today per [facility].  Didn't tolerate UF w/ HD Monday.   Vitals:   02/08/23 1534 02/08/23 1539 02/08/23 1544 02/08/23 1603  BP: 132/61 (!) 142/64 133/66 (!) 124/58  Pulse: 66 65 65 65  Resp: 14 13 16 11   Temp:    98.6 F (37 C)  TempSrc:    Oral  SpO2: 100% 98% 100% 98%  Weight:      Height:        Exam: Gen alert, no distress, chronically ill appearing Looks better No jvd or bruits Chest L clear, R dec'd at base RRR no RG Abd soft ntnd no mass or ascites +bs Ext no sig edema today, better Neuro is alert, Ox 3 , nf     LUA AVF+bruit       Renal-related home meds: - norvasc, coreg, hydralazine - phoslo 2 ac tid      OP HD: [facility] TTS  3.5h  350/ 600   ? Dry wt   2/2.5 LUA AVF (from feb 2024, needs updating)     Assessment/ Plan: Diabetic foot infection - on IV abx, poss osteo per MRI. Per pmd/ podiatry. Will likely need R BKA.  ESKD - HD today (see below). HD tomorrow.  HD schedule: he is usually TTS. Plan is to cont MWF while here and switch back to TTS upon dc.  HTN - bp's good, cont home meds Volume - no vol to get off w/ HD today. As above.  Anemia of eskd - Hb 8- 9 here. Follow.  MBD ckd - CCa a bit high, phos in range. Hold any vdra. Cont binders w/ meals.      Vinson Moselle MD  [facility] 02/08/2023, 4:56 PM  Recent Labs  Lab 02/03/23 1115 02/04/23 0331 02/05/23 0620 02/07/23 0318  HGB 8.9*   < > 8.5* 8.1*  ALBUMIN 2.7*  --   --   --   CALCIUM 9.6   < > 8.9 8.7*  PHOS  --   --  5.5*  --   CREATININE 18.57*   < > 10.81* 14.30*  K 4.4   < > 3.2* 4.0   < > = values in this interval not displayed.   No results for input(s): "IRON", "TIBC", "FERRITIN" in the last 168 hours. Inpatient medications:  amiodarone  200 mg Oral Daily   amphetamine-dextroamphetamine  5 mg Oral Daily   atorvastatin  80 mg Oral QHS   calcium acetate  1,334 mg Oral TID WC    carvedilol  12.5 mg Oral BID   Chlorhexidine Gluconate Cloth  6 each Topical Q0600   Chlorhexidine Gluconate Cloth  6 each Topical Q0600   dorzolamide-timolol  1 drop Right Eye BID   nystatin   Topical TID   sodium chloride flush  3 mL Intravenous Q12H   sodium chloride flush  3 mL Intravenous Q12H    sodium chloride     ceFEPime (MAXIPIME) IV 1 g (02/07/23 1821)   [START ON 02/09/2023] levETIRAcetam     levETIRAcetam 500 mg (02/08/23 0927)   vancomycin 750 mg (02/07/23 1620)   sodium chloride, acetaminophen **OR** acetaminophen, acetaminophen, camphor-menthol, hydrALAZINE, HYDROcodone-acetaminophen, labetalol, lidocaine, ondansetron (ZOFRAN) IV, oxyCODONE, sodium chloride flush

## 2023-02-08 NOTE — Plan of Care (Signed)
  Problem: Clinical Measurements: Goal: Will remain free from infection Outcome: Not Progressing Goal: Cardiovascular complication will be avoided Outcome: Not Progressing   Problem: Activity: Goal: Risk for activity intolerance will decrease Outcome: Not Progressing   Problem: Coping: Goal: Level of anxiety will decrease Outcome: Not Progressing

## 2023-02-08 NOTE — Progress Notes (Signed)
Patient returned from Cath lab.  VSS.  Patient unwilling to answer questions regarding pain level, stating "what kind of question is that?"  Right groin unremarkable with no signs of hematoma.    02/08/23 1603  Vitals  Temp 98.6 F (37 C)  Temp Source Oral  BP (!) 124/58  MAP (mmHg) 76  BP Location Right Arm  BP Method Automatic  Patient Position (if appropriate) Lying  Pulse Rate 65  Pulse Rate Source Monitor  ECG Heart Rate 65  Resp 11  Level of Consciousness  Level of Consciousness Alert  Oxygen Therapy  SpO2 98 %  O2 Device Room Air  Pain Assessment  Pain Scale Faces  Faces Pain Scale 6 (Patient refuses to answer nurse - stating "what kind of question is that?")

## 2023-02-08 NOTE — Progress Notes (Addendum)
  Progress Note    02/08/2023 7:39 AM 4 Days Post-Op  Patient reported unresponsive overnight/ early this morning. Head CT ordered- negative for any acute processes. He is responding appropriately this morning per wife CLI of BLE with wounds RLE BKA tomorrow with [name] Plan is for Angiogram LLE today with Dr. Hetty Blend Keep NPO Consent ordered  Graceann Congress, PA-C [facility] [phone] 02/08/2023 7:39 AM   More responsive and interactive this morning. Plan for Aortogram with bilateral LE angiogram today.  Daria Pastures MD [facility] Phone Number: [phone] 02/08/2023 10:38 AM'

## 2023-02-08 NOTE — Op Note (Signed)
    Patient name: [name]. 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  He is planned to undergo right below the knee amputation with [name] on 02/09/2023 and was noted to have discoloration and possibly ischemic skin changes to the left heel.  Risks and benefits of angiogram with possible intervention were reviewed, he expressed understanding and is willing to proceed.  Findings: Widely patent aorta and bilateral renal arteries.  Widely patent bilateral iliac systems.    Left common femoral, profunda and SFA are widely patent without flow-limiting stenosis.  The popliteal artery and all 3 tibial vessels are patent but with medial calcinosis, no flow-limiting stenosis.  Three-vessel runoff to the foot.  There is microvascular disease in the foot and the pedal arch is noted to be absent.  Right common femoral, found and SFA are widely patent without flow-limiting stenosis.  The popliteal artery and all 3 tibial vessels are patent with medial calcinosis, no flow-limiting stenosis.  Three-vessel runoff to the foot but there is microvascular disease with minimal filling in pedal vessels.   Procedure:  The patient was identified in the holding area and taken to the cath lab  The patient was then placed supine on the table and prepped and draped in the usual sterile fashion.  A time out was called.   Ultrasound was used to evaluate the right common femoral artery.  It was patent .  A digital ultrasound image was acquired.  A micropuncture needle was used to access the right common femoral artery under ultrasound guidance.  An 018 wire was advanced without resistance and a micropuncture sheath was placed.  The 018 wire was removed and a benson wire was placed.  The micropuncture sheath was exchanged for a 5 french sheath.  An omniflush catheter was advanced over the wire to the level of L-1.  An abdominal angiogram was obtained.  Next, using the omniflush catheter and a glide advantage wire, the aortic bifurcation was crossed and the catheter was placed into theleft external iliac artery and left runoff was obtained. This demonstrated the above findings.  right runoff was performed via retrograde sheath injections which demonstrated the above findings.  Contrast: 95 cc  Impression: Inline flow to the foot with three-vessel runoff bilaterally but with microvascular disease and minimal filling pedal vessels bilaterally.   Daria Pastures MD [facility] Office: [phone]

## 2023-02-08 NOTE — TOC Initial Note (Signed)
Transition of Care (TOC) - Initial/Assessment Note    Patient Details  Name: [name]. MRN: [id number] Date of Birth: 1970-06-18  Transition of Care [facility]) CM/SW Contact:    Eduard Roux, LCSW Phone Number: 02/08/2023, 10:21 AM  Clinical Narrative:                  CSW met with patient and spouse at bedside. CSW introduced self and explained role. CSW discussed rehab at SNF if recommended. Patient and spouse states they are interested in rehab if recommended. Spouse states she believes patient would benefit from rehab before returning home. She states he will need more physical care at that time then she can provide. CSW explained the SNF process. No preferred SNF at this time. CSW explained will follow along and move forward base on recommendations. Patient/spouse states understanding.   Patient scheduled for right sided below-knee amputation on Wednesday  TOC will provide bed offers once available for possible SNF placement  TOC will continue to follow and assist with discharge planning.   Antony Blackbird, MSW, LCSW Clinical Social Worker     Barriers to Discharge: Continued Medical Work up   Patient Goals and CMS Choice            Expected Discharge Plan and Services In-house Referral: Clinical Social Work     Living arrangements for the past 2 months: Single Family Home                                      Prior Living Arrangements/Services Living arrangements for the past 2 months: Single Family Home Lives with:: Self, Spouse Patient language and need for interpreter reviewed:: No        Need for Family Participation in Patient Care: Yes (Comment) Care giver support system in place?: Yes (comment)   Criminal Activity/Legal Involvement Pertinent to Current Situation/Hospitalization: No - Comment as needed  Activities of Daily Living      Permission Sought/Granted Permission sought to share information with : Family  Supports Permission granted to share information with : Yes, Verbal Permission Granted  Share Information with NAME: [name]  Permission granted to share info w AGENCY: SNFs  Permission granted to share info w Relationship: spouse  Permission granted to share info w Contact Information: [phone]  Emotional Assessment Appearance:: Appears stated age Attitude/Demeanor/Rapport: Engaged Affect (typically observed): Accepting Orientation: : Oriented to Self, Oriented to Place, Oriented to  Time, Oriented to Situation Alcohol / Substance Use: Not Applicable Psych Involvement: No (comment)  Admission diagnosis:  Diabetic foot ulcer (HCC) [E95.284, L97.509] Diabetic foot infection (HCC) [X32.440, L08.9] Patient Active Problem List   Diagnosis Date Noted   Gangrene of right foot (HCC) 02/07/2023   Pyogenic inflammation of bone (HCC) 02/04/2023   Diabetic foot infection (HCC) 02/04/2023   H/O spontaneous intraventricular intracranial hemorrhage 10/2021 02/26/2022   History of CVA (cerebrovascular accident) 02/26/2022   H/O enucleation of left eyeball 02/26/2022   PAD (peripheral artery disease) (HCC) 02/26/2022   History of seizure 02/26/2022   Seasonal allergic rhinitis 07/08/2021   Myoclonus 06/23/2021   Exposure of orbital implant 03/16/2021   Steal syndrome as complication of dialysis access (HCC) 02/06/2021   Coronary artery disease 02/05/2021   ESRD on hemodialysis (HCC)    Anemia due to chronic kidney disease, on chronic dialysis (HCC)    Paroxysmal atrial fibrillation (HCC) 11/24/2020   Abnormal gait  08/19/2020   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) 08/19/2020   Anxiety state 08/19/2020   Callosity 08/19/2020   ED (erectile dysfunction) of organic origin 08/19/2020   Heart failure (HCC) 08/19/2020   Long term (current) use of insulin (HCC) 08/19/2020   Myalgia 08/19/2020   CAD/ H/o prior STEMI 02/2020 s/p DES to LAD 08/19/2020   Blindness of left eye 08/15/2020    Combined forms of age-related cataract of right eye 04/17/2020   Benign prostatic hyperplasia with lower urinary tract symptoms 11/07/2019   Hypertension secondary to other renal disorders 07/20/2019   NVG (neovascular glaucoma), left, 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   Moderate protein-calorie malnutrition (HCC) 05/14/2019   Chronic anemia 05/07/2019   Coagulation defect, unspecified (HCC) 05/07/2019   Disorder of phosphorus metabolism, unspecified 05/07/2019   Iron deficiency anemia, unspecified 05/07/2019   Pruritus, unspecified 05/07/2019   Proteinuria 11/28/2018   Secondary hyperparathyroidism of renal origin (HCC) 11/28/2018   Chronic Grade 2 diastolic heart failure/EF 55 to 60% 12/13/2017   Diabetes mellitus, type II (HCC) 11/23/2017   Diabetic retinopathy (HCC) 07/05/2017   Uncontrolled type 2 diabetes mellitus with hyperglycemia, with long-term current use of insulin (HCC) 04/20/2016   Mixed hyperlipidemia    Essential hypertension, benign    PCP:  [name], FNP Pharmacy:   [facility] - [place], [place] - [address] [place] [address] Phone: [phone] Fax: [phone]     Social Drivers of Health (SDOH) Social History: SDOH Screenings   Food Insecurity: Patient Declined (02/04/2023)  Housing: Patient Declined (02/04/2023)  Transportation Needs: Patient Declined (02/04/2023)  Utilities: Patient Declined (02/04/2023)  Financial Resource Strain: High Risk (07/05/2017)  Physical Activity: Inactive (07/05/2017)  Social Connections: Moderately Integrated (07/05/2017)  Stress: Stress Concern Present (07/05/2017)  Tobacco Use: Low Risk  (02/04/2023)  Health Literacy: Low Risk  (06/01/2020)   Received from [facility]   SDOH Interventions:     Readmission Risk Interventions    07/27/2021   11:49 AM 11/27/2020    3:06 PM  Readmission Risk Prevention Plan  Transportation Screening Complete Complete   PCP or Specialist Appt within 3-5 Days  Complete  HRI or Home Care Consult  Complete  Social Work Consult for Recovery Care Planning/Counseling  Complete  Palliative Care Screening  Not Applicable  Medication Review Oceanographer) Complete Complete  SW Recovery Care/Counseling Consult Complete   Palliative Care Screening Not Applicable   Skilled Nursing Facility Not Applicable

## 2023-02-09 ENCOUNTER — Inpatient Hospital Stay (HOSPITAL_COMMUNITY): Payer: Medicare HMO | Admitting: Anesthesiology

## 2023-02-09 ENCOUNTER — Encounter (HOSPITAL_COMMUNITY): Payer: Self-pay | Admitting: Vascular Surgery

## 2023-02-09 ENCOUNTER — Encounter (HOSPITAL_COMMUNITY)
Admission: EM | Disposition: A | Discharge status: Skilled Nursing Facility | Payer: Self-pay | Source: Ambulatory Visit | Attending: Family Medicine

## 2023-02-09 DIAGNOSIS — Z89511 Acquired absence of right leg below knee: Secondary | ICD-10-CM | POA: Diagnosis not present

## 2023-02-09 DIAGNOSIS — I739 Peripheral vascular disease, unspecified: Secondary | ICD-10-CM | POA: Diagnosis not present

## 2023-02-09 DIAGNOSIS — E1152 Type 2 diabetes mellitus with diabetic peripheral angiopathy with gangrene: Secondary | ICD-10-CM

## 2023-02-09 DIAGNOSIS — N186 End stage renal disease: Secondary | ICD-10-CM | POA: Diagnosis not present

## 2023-02-09 DIAGNOSIS — I96 Gangrene, not elsewhere classified: Secondary | ICD-10-CM | POA: Diagnosis not present

## 2023-02-09 DIAGNOSIS — I10 Essential (primary) hypertension: Secondary | ICD-10-CM | POA: Diagnosis not present

## 2023-02-09 HISTORY — PX: AMPUTATION: SHX166

## 2023-02-09 LAB — POCT I-STAT, CHEM 8
BUN: 38 mg/dL — ABNORMAL HIGH (ref 6–20)
Calcium, Ion: 1 mmol/L — ABNORMAL LOW (ref 1.15–1.40)
Chloride: 97 mmol/L — ABNORMAL LOW (ref 98–111)
Creatinine, Ser: 5.9 mg/dL — ABNORMAL HIGH (ref 0.61–1.24)
Glucose, Bld: 78 mg/dL (ref 70–99)
HCT: 29 % — ABNORMAL LOW (ref 39.0–52.0)
Hemoglobin: 9.9 g/dL — ABNORMAL LOW (ref 13.0–17.0)
Potassium: 4 mmol/L (ref 3.5–5.1)
Sodium: 134 mmol/L — ABNORMAL LOW (ref 135–145)
TCO2: 29 mmol/L (ref 22–32)

## 2023-02-09 LAB — AEROBIC/ANAEROBIC CULTURE W GRAM STAIN (SURGICAL/DEEP WOUND)

## 2023-02-09 LAB — BASIC METABOLIC PANEL
Anion gap: 15 (ref 5–15)
BUN: 72 mg/dL — ABNORMAL HIGH (ref 6–20)
CO2: 24 mmol/L (ref 22–32)
Calcium: 8.7 mg/dL — ABNORMAL LOW (ref 8.9–10.3)
Chloride: 97 mmol/L — ABNORMAL LOW (ref 98–111)
Creatinine, Ser: 9.4 mg/dL — ABNORMAL HIGH (ref 0.61–1.24)
GFR, Estimated: 6 mL/min — ABNORMAL LOW (ref >60)
Glucose, Bld: 98 mg/dL (ref 70–99)
Potassium: 4.1 mmol/L (ref 3.5–5.1)
Sodium: 136 mmol/L (ref 135–145)

## 2023-02-09 LAB — CBC
HCT: 25.5 % — ABNORMAL LOW (ref 39.0–52.0)
Hemoglobin: 7.9 g/dL — ABNORMAL LOW (ref 13.0–17.0)
MCH: 22.5 pg — ABNORMAL LOW (ref 26.0–34.0)
MCHC: 31 g/dL (ref 30.0–36.0)
MCV: 72.6 fL — ABNORMAL LOW (ref 80.0–100.0)
Platelets: 219 10*3/uL (ref 150–400)
RBC: 3.51 MIL/uL — ABNORMAL LOW (ref 4.22–5.81)
RDW: 20.2 % — ABNORMAL HIGH (ref 11.5–15.5)
WBC: 16.1 10*3/uL — ABNORMAL HIGH (ref 4.0–10.5)
nRBC: 0.2 % (ref 0.0–0.2)

## 2023-02-09 LAB — LIPID PANEL
Cholesterol: 60 mg/dL (ref 0–200)
HDL: 14 mg/dL — ABNORMAL LOW (ref >40)
LDL Cholesterol: 23 mg/dL (ref 0–99)
Total CHOL/HDL Ratio: 4.3 {ratio}
Triglycerides: 115 mg/dL (ref <150)
VLDL: 23 mg/dL (ref 0–40)

## 2023-02-09 LAB — GLUCOSE, CAPILLARY: Glucose-Capillary: 80 mg/dL (ref 70–99)

## 2023-02-09 LAB — TYPE AND SCREEN
ABO/RH(D): A POS
Antibody Screen: NEGATIVE

## 2023-02-09 SURGERY — AMPUTATION BELOW KNEE
Anesthesia: General | Site: Knee | Laterality: Right

## 2023-02-09 MED ORDER — CHLORHEXIDINE GLUCONATE 0.12 % MT SOLN: 15.0000 mL | Freq: Once | OROMUCOSAL | Status: DC

## 2023-02-09 MED ORDER — MIDAZOLAM HCL 2 MG/2ML IJ SOLN
1.0000 mg | Freq: Once | INTRAMUSCULAR | Status: AC
Start: 2023-02-09 — End: 2023-02-09

## 2023-02-09 MED ORDER — MIDAZOLAM HCL 2 MG/2ML IJ SOLN
INTRAMUSCULAR | Status: AC
  Administered 2023-02-09: 1 mg via INTRAVENOUS
  Filled 2023-02-09: qty 2

## 2023-02-09 MED ORDER — FENTANYL CITRATE (PF) 100 MCG/2ML IJ SOLN
25.0000 ug | INTRAMUSCULAR | Status: DC | PRN
  Administered 2023-02-09: 25 ug via INTRAVENOUS

## 2023-02-09 MED ORDER — PROPOFOL 10 MG/ML IV BOLUS
INTRAVENOUS | Status: AC
  Filled 2023-02-09: qty 20

## 2023-02-09 MED ORDER — SODIUM CHLORIDE 0.9 % IV SOLN: INTRAVENOUS | Status: DC

## 2023-02-09 MED ORDER — DEXAMETHASONE SODIUM PHOSPHATE 10 MG/ML IJ SOLN
INTRAMUSCULAR | Status: DC | PRN
  Administered 2023-02-09: 10 mg via INTRAVENOUS

## 2023-02-09 MED ORDER — TRANEXAMIC ACID-NACL 1000-0.7 MG/100ML-% IV SOLN
INTRAVENOUS | Status: AC
  Filled 2023-02-09: qty 100

## 2023-02-09 MED ORDER — FENTANYL CITRATE (PF) 100 MCG/2ML IJ SOLN: 50.0000 ug | Freq: Once | INTRAMUSCULAR | Status: AC

## 2023-02-09 MED ORDER — FENTANYL CITRATE (PF) 100 MCG/2ML IJ SOLN
INTRAMUSCULAR | Status: AC
  Administered 2023-02-09: 50 ug via INTRAVENOUS
  Filled 2023-02-09: qty 2

## 2023-02-09 MED ORDER — POTASSIUM CHLORIDE CRYS ER 20 MEQ PO TBCR
20.0000 meq | EXTENDED_RELEASE_TABLET | Freq: Every day | ORAL | Status: DC | PRN
Start: 2023-02-09 — End: 2023-02-11

## 2023-02-09 MED ORDER — FENTANYL CITRATE (PF) 250 MCG/5ML IJ SOLN
INTRAMUSCULAR | Status: AC
  Filled 2023-02-09: qty 5

## 2023-02-09 MED ORDER — CHLORHEXIDINE GLUCONATE 0.12 % MT SOLN
OROMUCOSAL | Status: AC
  Administered 2023-02-09: 15 mL via OROMUCOSAL
  Filled 2023-02-09: qty 15

## 2023-02-09 MED ORDER — ZINC SULFATE 220 (50 ZN) MG PO CAPS
220.0000 mg | ORAL_CAPSULE | Freq: Every day | ORAL | Status: DC
  Administered 2023-02-09 – 2023-02-18 (×10): 220 mg via ORAL
  Filled 2023-02-09 (×10): qty 1

## 2023-02-09 MED ORDER — JUVEN PO PACK
1.0000 | PACK | Freq: Two times a day (BID) | ORAL | Status: DC
  Administered 2023-02-10 – 2023-02-22 (×20): 1 via ORAL
  Filled 2023-02-09 (×21): qty 1

## 2023-02-09 MED ORDER — CEFAZOLIN SODIUM-DEXTROSE 2-3 GM-%(50ML) IV SOLR
INTRAVENOUS | Status: DC | PRN
  Administered 2023-02-09: 2 g via INTRAVENOUS

## 2023-02-09 MED ORDER — VITAMIN C 500 MG PO TABS
1000.0000 mg | ORAL_TABLET | Freq: Every day | ORAL | Status: DC
  Administered 2023-02-09 – 2023-02-23 (×15): 1000 mg via ORAL
  Filled 2023-02-09 (×14): qty 2

## 2023-02-09 MED ORDER — ACETAMINOPHEN 650 MG RE SUPP: 650.0000 mg | Freq: Four times a day (QID) | RECTAL | Status: DC | PRN

## 2023-02-09 MED ORDER — MIDAZOLAM HCL 2 MG/2ML IJ SOLN
INTRAMUSCULAR | Status: AC
  Filled 2023-02-09: qty 2

## 2023-02-09 MED ORDER — ONDANSETRON HCL 4 MG/2ML IJ SOLN
INTRAMUSCULAR | Status: DC | PRN
  Administered 2023-02-09: 4 mg via INTRAVENOUS

## 2023-02-09 MED ORDER — ACETAMINOPHEN 325 MG PO TABS: 650.0000 mg | ORAL_TABLET | Freq: Four times a day (QID) | ORAL | Status: DC | PRN

## 2023-02-09 MED ORDER — OXYCODONE HCL 5 MG PO TABS: 5.0000 mg | ORAL_TABLET | Freq: Once | ORAL | Status: DC | PRN

## 2023-02-09 MED ORDER — ACETAMINOPHEN 325 MG PO TABS: 325.0000 mg | ORAL_TABLET | Freq: Four times a day (QID) | ORAL | Status: DC | PRN

## 2023-02-09 MED ORDER — FENTANYL CITRATE (PF) 250 MCG/5ML IJ SOLN
INTRAMUSCULAR | Status: DC | PRN
  Administered 2023-02-09: 50 ug via INTRAVENOUS

## 2023-02-09 MED ORDER — TRANEXAMIC ACID 1000 MG/10ML IV SOLN
INTRAVENOUS | Status: DC | PRN
  Administered 2023-02-09: 1000 mg via INTRAVENOUS

## 2023-02-09 MED ORDER — ACETAMINOPHEN 160 MG/5ML PO SOLN: 325.0000 mg | ORAL | Status: DC | PRN

## 2023-02-09 MED ORDER — ACETAMINOPHEN 325 MG PO TABS
325.0000 mg | ORAL_TABLET | ORAL | Status: DC | PRN
Start: 2023-02-09 — End: 2023-02-09

## 2023-02-09 MED ORDER — OXYCODONE HCL 5 MG/5ML PO SOLN: 5.0000 mg | Freq: Once | ORAL | Status: DC | PRN

## 2023-02-09 MED ORDER — DROPERIDOL 2.5 MG/ML IJ SOLN: 0.6250 mg | Freq: Once | INTRAMUSCULAR | Status: DC | PRN

## 2023-02-09 MED ORDER — PROPOFOL 500 MG/50ML IV EMUL
INTRAVENOUS | Status: DC | PRN
  Administered 2023-02-09: 75 ug/kg/min via INTRAVENOUS

## 2023-02-09 MED ORDER — HYDROMORPHONE HCL 1 MG/ML IJ SOLN
0.5000 mg | INTRAMUSCULAR | Status: DC | PRN
  Administered 2023-02-13 – 2023-02-20 (×3): 0.5 mg via INTRAVENOUS
  Administered 2023-02-23: 1 mg via INTRAVENOUS
  Filled 2023-02-09 (×3): qty 0.5
  Filled 2023-02-09: qty 1

## 2023-02-09 MED ORDER — CEFAZOLIN SODIUM-DEXTROSE 2-4 GM/100ML-% IV SOLN: 2.0000 g | Freq: Three times a day (TID) | INTRAVENOUS | Status: DC

## 2023-02-09 MED ORDER — PROPOFOL 10 MG/ML IV BOLUS
INTRAVENOUS | Status: DC | PRN
  Administered 2023-02-09: 50 mg via INTRAVENOUS
  Administered 2023-02-09: 150 mg via INTRAVENOUS

## 2023-02-09 MED ORDER — MAGNESIUM SULFATE 2 GM/50ML IV SOLN: 2.0000 g | Freq: Every day | INTRAVENOUS | Status: DC | PRN

## 2023-02-09 MED ORDER — LIDOCAINE 2% (20 MG/ML) 5 ML SYRINGE
INTRAMUSCULAR | Status: DC | PRN
  Administered 2023-02-09: 60 mg via INTRAVENOUS

## 2023-02-09 MED ORDER — PHENYLEPHRINE 80 MCG/ML (10ML) SYRINGE FOR IV PUSH (FOR BLOOD PRESSURE SUPPORT)
PREFILLED_SYRINGE | INTRAVENOUS | Status: DC | PRN
  Administered 2023-02-09 (×2): 80 ug via INTRAVENOUS

## 2023-02-09 MED ORDER — ACETAMINOPHEN 10 MG/ML IV SOLN: 1000.0000 mg | Freq: Once | INTRAVENOUS | Status: DC | PRN

## 2023-02-09 MED ORDER — FENTANYL CITRATE (PF) 100 MCG/2ML IJ SOLN
INTRAMUSCULAR | Status: AC
  Administered 2023-02-09: 25 ug via INTRAVENOUS
  Filled 2023-02-09: qty 2

## 2023-02-09 MED ORDER — PHENYLEPHRINE HCL-NACL 20-0.9 MG/250ML-% IV SOLN
INTRAVENOUS | Status: DC | PRN
  Administered 2023-02-09: 25 ug/min via INTRAVENOUS

## 2023-02-09 MED ORDER — ORAL CARE MOUTH RINSE: 15.0000 mL | Freq: Once | OROMUCOSAL | Status: DC

## 2023-02-09 MED ORDER — VASHE WOUND IRRIGATION OPTIME
TOPICAL | Status: DC | PRN
  Administered 2023-02-09: 34 [oz_av]

## 2023-02-09 MED ORDER — ROPIVACAINE HCL 5 MG/ML IJ SOLN
INTRAMUSCULAR | Status: DC | PRN
  Administered 2023-02-09: 40 mL via PERINEURAL

## 2023-02-09 SURGICAL SUPPLY — 39 items
BAG COUNTER SPONGE SURGICOUNT (BAG) IMPLANT
BLADE SAW RECIP 87.9 MT (BLADE) ×1 IMPLANT
BLADE SURG 21 STRL SS (BLADE) ×1 IMPLANT
BNDG COHESIVE 6X5 TAN ST LF (GAUZE/BANDAGES/DRESSINGS) IMPLANT
CANISTER PREVENA PLUS 150 (CANNISTER) IMPLANT
CANISTER WOUND CARE 500ML ATS (WOUND CARE) ×1 IMPLANT
CANISTER WOUNDNEG PRESSURE 500 (CANNISTER) IMPLANT
CLEANSER WND VASHE 34 (WOUND CARE) IMPLANT
COVER SURGICAL LIGHT HANDLE (MISCELLANEOUS) ×1 IMPLANT
CUFF TRNQT CYL 34X4.125X (TOURNIQUET CUFF) ×1 IMPLANT
DRAPE DERMATAC (DRAPES) IMPLANT
DRAPE INCISE IOBAN 66X45 STRL (DRAPES) ×1 IMPLANT
DRAPE U-SHAPE 47X51 STRL (DRAPES) ×1 IMPLANT
DRESSING PREVENA PLUS CUSTOM (GAUZE/BANDAGES/DRESSINGS) ×1 IMPLANT
DRSG PREVENA PLUS CUSTOM (GAUZE/BANDAGES/DRESSINGS) ×2
DURAPREP 26ML APPLICATOR (WOUND CARE) ×1 IMPLANT
ELECT REM PT RETURN 9FT ADLT (ELECTROSURGICAL) ×1
ELECTRODE REM PT RTRN 9FT ADLT (ELECTROSURGICAL) ×1 IMPLANT
GLOVE BIOGEL PI IND STRL 9 (GLOVE) ×1 IMPLANT
GLOVE SURG ORTHO 9.0 STRL STRW (GLOVE) ×1 IMPLANT
GOWN STRL REUS W/ TWL XL LVL3 (GOWN DISPOSABLE) ×2 IMPLANT
GRAFT SKIN WND MICRO 38 (Tissue) IMPLANT
KIT BASIN OR (CUSTOM PROCEDURE TRAY) ×1 IMPLANT
KIT TURNOVER KIT B (KITS) ×1 IMPLANT
MANIFOLD NEPTUNE II (INSTRUMENTS) ×1 IMPLANT
NS IRRIG 1000ML POUR BTL (IV SOLUTION) ×1 IMPLANT
PACK ORTHO EXTREMITY (CUSTOM PROCEDURE TRAY) ×1 IMPLANT
PAD ARMBOARD 7.5X6 YLW CONV (MISCELLANEOUS) ×1 IMPLANT
PREVENA RESTOR ARTHOFORM 46X30 (CANNISTER) ×1 IMPLANT
SPONGE T-LAP 18X18 ~~LOC~~+RFID (SPONGE) IMPLANT
STAPLER SKIN PROX WIDE 3.9 (STAPLE) IMPLANT
STAPLER VISISTAT 35W (STAPLE) IMPLANT
STOCKINETTE IMPERVIOUS LG (DRAPES) ×1 IMPLANT
SUT ETHILON 2 0 PSLX (SUTURE) IMPLANT
SUT SILK 2-0 18XBRD TIE 12 (SUTURE) ×1 IMPLANT
SUT VIC AB 1 CTX 27 (SUTURE) ×2 IMPLANT
TOWEL GREEN STERILE (TOWEL DISPOSABLE) ×1 IMPLANT
TUBE CONNECTING 12X1/4 (SUCTIONS) ×1 IMPLANT
YANKAUER SUCT BULB TIP NO VENT (SUCTIONS) ×1 IMPLANT

## 2023-02-09 NOTE — Progress Notes (Signed)
PROGRESS NOTE    [name].  JWJ:[id number] DOB: [birth year] DOA: 02/03/2023 PCP: [name], FNP  Subjective: Attempted to see patient today. He's been gone to HD and then to OR today. I did see patient's wife at bedside. Angiogram shows flow to left lower leg. Doesn't look like he is going to get any surgery on left leg this admission.   Hospital Course: HPI: [name]. is a 52 y.o. male with medical history significant of type 2 diabetes mellitus, acute osteomyelitis of toe of right foot with gangrene status post amputation of the great toe on the right foot, paroxysmal atrial fibrillation not on anticoagulation, hypertension, ESRD on HD, seizure disorder, BPH, diabetic polyneuropathy, GERD, gastroparesis, hypertension was sent to ED from podiatry office.  Per patient and his wife who is at the bedside, patient's wife noted a wound on his right foot on Sunday.  He called his podiatrist/Dr. Wagoner and was given an appointment to see today.  Being seen in the podiatry office today, he was noted to have significant pus and infected wound, he was directed to the emergency department for further workup and admission.  Patient denies any complaint, no pain.  Per wife, patient typically does not notice things himself until she does.  No reports of fever, chills, sweating, nausea, vomiting any other complaint or any foot trauma, any recent travel or sick contact.   ED Course: Hemodynamically stable upon arrival.  X-ray right foot unremarkable.  MRI ordered and pending.  Patient received cefepime, Flagyl and vancomycin in the ED and nephrology was consulted for dialysis and hospitalist were consulted for admission.  Significant Events: Admitted 02/03/2023 for diabetic right foot ulcer with concern for acute osteomyelitis   Significant Labs: Admission WBC 22.3, Bicarb 13, BUN 122, Scr 18.57  Significant Imaging Studies: MRI Right foot Substantial fluid/edema tracking within  along the quadratus plantae, abductor hallucis, flexor digitorum brevis, third and fourth plantar and dorsal interosseous, and abductor digiti minimi muscles, confluent with subcutaneous edema which is primarily infiltrative but which may have small fluid pockets tracking dorsal lateral to the fifth metatarsal, dorsal to the fourth metatarsal and into the fourth toe, and in the plantar subcutaneous tissues below the distal fourth and fifth metatarsals. Appearance concerning for active infection. 2. Cortical irregularity and mild marrow edema in the head of the fifth metatarsal suspicious for early osteomyelitis. 3. Previous small toe amputation at the MTP joint.  Antibiotic Therapy: Anti-infectives (From admission, onward)    Start     Dose/Rate Route Frequency Ordered Stop   02/08/23 1800  vancomycin (VANCOREADY) IVPB 750 mg/150 mL  Status:  Discontinued        750 mg 150 mL/hr over 60 Minutes Intravenous Every T-Th-Sa (Hemodialysis) 02/04/23 2042 02/07/23 1436   02/07/23 1530  vancomycin (VANCOREADY) IVPB 750 mg/150 mL        750 mg 150 mL/hr over 60 Minutes Intravenous Every M-W-F (Hemodialysis) 02/07/23 1436     02/05/23 0100  vancomycin (VANCOREADY) IVPB 750 mg/150 mL        750 mg 150 mL/hr over 60 Minutes Intravenous  Once 02/04/23 2042 02/05/23 0509   02/04/23 1800  ceFEPIme (MAXIPIME) 1 g in sodium chloride 0.9 % 100 mL IVPB        1 g 200 mL/hr over 30 Minutes Intravenous Every 24 hours 02/03/23 1401     12 /12/24 1330  vancomycin (VANCOCIN) IVPB 1000 mg/200 mL premix       Placed in "Followed  by" Linked Group   1,000 mg 200 mL/hr over 60 Minutes Intravenous  Once 02/03/23 1224 02/03/23 1513   02/03/23 1230  metroNIDAZOLE (FLAGYL) IVPB 500 mg        500 mg 100 mL/hr over 60 Minutes Intravenous  Once 02/03/23 1221 02/03/23 1402   02/03/23 1230  ceFEPIme (MAXIPIME) 2 g in sodium chloride 0.9 % 100 mL IVPB        2 g 200 mL/hr over 30 Minutes Intravenous  Once 02/03/23 1224 02/03/23  1352   02/03/23 1230  vancomycin (VANCOCIN) IVPB 1000 mg/200 mL premix       Placed in "Followed by" Linked Group   1,000 mg 200 mL/hr over 60 Minutes Intravenous  Once 02/03/23 1224 02/03/23 1412       Procedures:   Consultants: Nephrology Vascular surgery orthopedics    Assessment and Plan: * Gangrene of right foot (HCC) On admission to 02-06-2023. Pt has osteomyelitis of right foot. Pt taken to OR on 02-04-2023 by podiatry. Pt had Partial fourth ray amputation, right foot.  Further resection of fifth metatarsal bone, right foot. [name] with orthopedics saw the patient and felt that pt had gangrene of the foot.  02-07-2023 pt will need right BKA by ortho. Planned on Wednesday Feb 09, 2023. 02-08-2023 angiogram today to see if he will need left lower extremity surgery.  PAD (peripheral artery disease) (HCC) 02-07-2023 pt noted to hypoglycemic today. Angiogram canceled.  Angiogram to make sure that he doesn't need left BKA at the same time. 02-08-2023 angiogram today to see if he will need left lower extremity surgery.   History of CVA (cerebrovascular accident) Chronic.  Coronary artery disease 02-07-2023 stable.  ESRD on hemodialysis (HCC) 02-07-2023, 02-08-2023 continue with HD per nephrology  Anemia due to chronic kidney disease, on chronic dialysis (HCC) 02-07-2023 stable.  Paroxysmal atrial fibrillation (HCC) 02-07-2023 continue with amiodarone 200 mg qday 02-08-2023 off systemic anticoagulation in preparation BKA  Uncontrolled type 2 diabetes mellitus with hyperglycemia, with long-term current use of insulin (HCC) 02-07-2023 CBG low today. Pt confused. Will give 1/2 amp D50 IV. Hypoglycemia may be due to pt being NPO for his angiogram today. 02-08-2023 diabetic hypoglycemia improved with IVF with dextrose.  Essential hypertension, benign 02-07-2023 continue coreg 12.5 mg bid. 02-08-2023 stable. Continue coreg   DVT prophylaxis: Place and maintain  sequential compression device Start: 02/05/23 1515     Code Status: Full Code Family Communication: discussed with pt's wife at bedside Disposition Plan: SNF vs home Reason for continuing need for hospitalization: going to OR today.  Objective: Vitals:   02/09/23 1505 02/09/23 1510 02/09/23 1515 02/09/23 1520  BP: (!) 147/62 (!) 128/57 (!) 124/54   Pulse: 62 66 (!) 44 (!) 53  Resp: 14 14 14 10   Temp:      TempSrc:      SpO2: 100% 97% 100% 99%  Weight:      Height:        Intake/Output Summary (Last 24 hours) at 02/09/2023 1609 Last data filed at 02/09/2023 1605 Gross per 24 hour  Intake 410.95 ml  Output 20 ml  Net 390.95 ml   Filed Weights   02/09/23 0918 02/09/23 1309 02/09/23 1334  Weight: 80.3 kg 80.5 kg 80.5 kg    Examination:  Pt not seen today.  Physical Exam  Data Reviewed: I have personally reviewed following labs and imaging studies  CBC: Recent Labs  Lab 02/03/23 1115 02/04/23 0331 02/05/23 0620 02/07/23 0318 02/09/23 0323 02/09/23  1420  WBC 22.3* 22.5* 22.6* 17.9* 16.1*  --   NEUTROABS 19.5*  --   --   --   --   --   HGB 8.9* 8.5* 8.5* 8.1* 7.9* 9.9*  HCT 28.7* 26.4* 25.3* 25.4* 25.5* 29.0*  MCV 73.4* 69.8* 69.1* 70.8* 72.6*  --   PLT 203 206 209 224 219  --    Basic Metabolic Panel: Recent Labs  Lab 02/03/23 1115 02/03/23 1120 02/04/23 0331 02/05/23 0620 02/07/23 0318 02/09/23 0323 02/09/23 1420  NA 141  --  138 139 135 136 134*  K 4.4  --  4.6 3.2* 4.0 4.1 4.0  CL 104  --  104 101 101 97* 97*  CO2 13*  --  15* 22 19* 24  --   GLUCOSE 82  --  99 90 103* 98 78  BUN 122*  --  132* 65* 105* 72* 38*  CREATININE 18.57*  --  18.70* 10.81* 14.30* 9.40* 5.90*  CALCIUM 9.6  --  8.8* 8.9 8.7* 8.7*  --   MG  --  2.4  --  1.8  --   --   --   PHOS  --   --   --  5.5*  --   --   --    GFR: Estimated Creatinine Clearance: 16.3 mL/min (A) (by C-G formula based on SCr of 5.9 mg/dL (H)). Liver Function Tests: Recent Labs  Lab 02/03/23 1115   AST 12*  ALT 10  ALKPHOS 65  BILITOT 0.9  PROT 6.9  ALBUMIN 2.7*   CBG: Recent Labs  Lab 02/07/23 1415 02/07/23 1448 02/07/23 1643 02/07/23 2132 02/08/23 1406  GLUCAP 68* 141* 90 113* 89   Lipid Profile: Recent Labs    02/09/23 0323  CHOL 60  HDL 14*  LDLCALC 23  TRIG 657  CHOLHDL 4.3   Sepsis Labs: Recent Labs  Lab 02/03/23 1256  LATICACIDVEN 1.1    Recent Results (from the past 240 hours)  Culture, blood (routine x 2)     Status: None   Collection Time: 02/03/23 12:22 PM   Specimen: BLOOD RIGHT ARM  Result Value Ref Range Status   Specimen Description BLOOD RIGHT ARM  Final   Special Requests   Final    BOTTLES DRAWN AEROBIC AND ANAEROBIC Blood Culture results may not be optimal due to an inadequate volume of blood received in culture bottles   Culture   Final    NO GROWTH 5 DAYS Performed at [facility], [address] [place], [place] [address]    Report Status 02/08/2023 FINAL  Final  Culture, blood (routine x 2)     Status: None   Collection Time: 02/03/23 12:27 PM   Specimen: BLOOD RIGHT HAND  Result Value Ref Range Status   Specimen Description BLOOD RIGHT HAND  Final   Special Requests   Final    BOTTLES DRAWN AEROBIC AND ANAEROBIC Blood Culture results may not be optimal due to an inadequate volume of blood received in culture bottles   Culture   Final    NO GROWTH 5 DAYS Performed at [facility], [address], [place], [place] [address]    Report Status 02/08/2023 FINAL  Final  Aerobic/Anaerobic Culture w Gram Stain (surgical/deep wound)     Status: None   Collection Time: 02/04/23 12:06 PM   Specimen: Foot, Right; Tissue  Result Value Ref Range Status   Specimen Description TISSUE  Final   Special Requests  RIGHT FOOT  Final   Gram Stain   Final    FEW WBC PRESENT, PREDOMINANTLY PMN ABUNDANT GRAM POSITIVE COCCI IN PAIRS RARE GRAM NEGATIVE COCCOBACILLI    Culture   Final    ABUNDANT STAPHYLOCOCCUS AUREUS MODERATE  CORYNEBACTERIUM STRIATUM Standardized susceptibility testing for this organism is not available. NO ANAEROBES ISOLATED Performed at [facility], [address], [place], [place] [address]    Report Status 02/09/2023 FINAL  Final   Organism ID, Bacteria STAPHYLOCOCCUS AUREUS  Final      Susceptibility   Staphylococcus aureus - MIC*    CIPROFLOXACIN <=0.5 SENSITIVE Sensitive     ERYTHROMYCIN RESISTANT Resistant     GENTAMICIN <=0.5 SENSITIVE Sensitive     OXACILLIN <=0.25 SENSITIVE Sensitive     TETRACYCLINE <=1 SENSITIVE Sensitive     VANCOMYCIN 1 SENSITIVE Sensitive     TRIMETH/SULFA <=10 SENSITIVE Sensitive     CLINDAMYCIN RESISTANT Resistant     RIFAMPIN <=0.5 SENSITIVE Sensitive     Inducible Clindamycin POSITIVE Resistant     LINEZOLID 2 SENSITIVE Sensitive     * ABUNDANT STAPHYLOCOCCUS AUREUS  Aerobic/Anaerobic Culture w Gram Stain (surgical/deep wound)     Status: None   Collection Time: 02/04/23 12:06 PM   Specimen: Foot, Right; Wound  Result Value Ref Range Status   Specimen Description WOUND  Final   Special Requests RIGHT FOOT  Final   Gram Stain   Final    MODERATE WBC PRESENT, PREDOMINANTLY PMN MODERATE GRAM POSITIVE COCCI    Culture   Final    ABUNDANT STAPHYLOCOCCUS AUREUS SUSCEPTIBILITIES PERFORMED ON PREVIOUS CULTURE WITHIN THE LAST 5 DAYS. MODERATE CORYNEBACTERIUM STRIATUM Standardized susceptibility testing for this organism is not available. NO ANAEROBES ISOLATED Performed at [facility], [address] [place], [place] [address]    Report Status 02/09/2023 FINAL  Final  Surgical pcr screen     Status: Abnormal   Collection Time: 02/08/23  8:05 PM   Specimen: Nasal Mucosa; Nasal Swab  Result Value Ref Range Status   MRSA, PCR NEGATIVE NEGATIVE Final   Staphylococcus aureus POSITIVE (A) NEGATIVE Final    Comment: (NOTE) The Xpert SA Assay (FDA approved for NASAL specimens in patients 40 years of age and older), is one component of a  comprehensive surveillance program. It is not intended to diagnose infection nor to guide or monitor treatment. Performed at [facility], [address] [place], [place] [address]      Radiology Studies: PERIPHERAL VASCULAR CATHETERIZATION Result Date: 02/09/2023 Images from the original result were not included.   Patient name: [name].  MRN: [id number]        DOB: 18-Nov-1970            Sex: male  02/03/2023 - 02/08/2023 Pre-operative Diagnosis: Bilateral foot wounds Post-operative diagnosis:  Same Surgeon:  Daria Pastures, MD Procedure Performed:  Ultrasound-guided access of right common femoral artery Second-order cannulation of left external iliac artery Aortogram and bilateral lower extremity angiogram Closure of right common femoral artery with Mynx closure device  Indications: 52 year old male with a significant history of type 2 diabetes, A-fib, HTN, ESRD on HD who is status post toe amputation on the right foot which has been nonhealing with recurrent infection and osteomyelitis.  He is planned to undergo right below the knee amputation with [name] on 02/09/2023 and was noted to have discoloration and possibly ischemic skin changes to the left heel.  Risks and benefits  of angiogram with possible intervention were reviewed, he expressed understanding and is willing to proceed.  Findings: Widely patent aorta and bilateral renal arteries.  Widely patent bilateral iliac systems.   Left common femoral, profunda and SFA are widely patent without flow-limiting stenosis.  The popliteal artery and all 3 tibial vessels are patent but with medial calcinosis, no flow-limiting stenosis.  Three-vessel runoff to the foot.  There is microvascular disease in the foot and the pedal arch is noted to be absent.  Right common femoral, found and SFA are widely patent without flow-limiting stenosis.  The popliteal artery and all 3 tibial vessels are patent with medial calcinosis, no flow-limiting  stenosis.  Three-vessel runoff to the foot but there is microvascular disease with minimal filling in pedal vessels.             Procedure:  The patient was identified in the holding area and taken to the cath lab  The patient was then placed supine on the table and prepped and draped in the usual sterile fashion.  A time out was called.  Ultrasound was used to evaluate the right common femoral artery.  It was patent .  A digital ultrasound image was acquired.  A micropuncture needle was used to access the right common femoral artery under ultrasound guidance.  An 018 wire was advanced without resistance and a micropuncture sheath was placed.  The 018 wire was removed and a benson wire was placed.  The micropuncture sheath was exchanged for a 5 french sheath.  An omniflush catheter was advanced over the wire to the level of L-1.  An abdominal angiogram was obtained.  Next, using the omniflush catheter and a glide advantage wire, the aortic bifurcation was crossed and the catheter was placed into theleft external iliac artery and left runoff was obtained. This demonstrated the above findings. right runoff was performed via retrograde sheath injections which demonstrated the above findings.  Contrast: 95 cc  Impression: Inline flow to the foot with three-vessel runoff bilaterally but with microvascular disease and minimal filling pedal vessels bilaterally.   Daria Pastures MD [facility] Office: [phone]   CT HEAD WO CONTRAST ( ) Result Date: 02/07/2023 CLINICAL DATA:  Delirium EXAM: CT HEAD WITHOUT CONTRAST TECHNIQUE: Contiguous axial images were obtained from the base of the skull through the vertex without intravenous contrast. RADIATION DOSE REDUCTION: This exam was performed according to the departmental dose-optimization program which includes automated exposure control, adjustment of the mA and/or kV according to patient size and/or use of iterative reconstruction  technique. COMPARISON:  None Available. FINDINGS: Brain: Bilateral chronic superior frontal lobe infarcts. No mass, hemorrhage or extra-axial collection. No hydrocephalus. Vascular: Calcific atherosclerosis at the skull base. Skull: Negative Sinuses/Orbits: Paranasal sinuses are clear.  Absent left lobe. Other: None IMPRESSION: 1. No acute intracranial abnormality. 2. Chronic bilateral superior frontal lobe infarcts. Electronically Signed   By: Deatra Robinson M.D.   On: 02/07/2023 23:23    Scheduled Meds:  [MAR Hold] amiodarone  200 mg Oral Daily   [MAR Hold] amphetamine-dextroamphetamine  5 mg Oral Daily   [MAR Hold] atorvastatin  80 mg Oral QHS   [MAR Hold] calcium acetate  1,334 mg Oral TID WC   [MAR Hold] carvedilol  12.5 mg Oral BID   [MAR Hold] Chlorhexidine Gluconate Cloth  6 each Topical Q0600   [MAR Hold] Chlorhexidine Gluconate Cloth  6 each Topical Q0600   [MAR Hold] dorzolamide-timolol  1 drop Right Eye BID   [  MAR Hold] mupirocin ointment  1 Application Nasal BID   [MAR Hold] nystatin   Topical TID   [MAR Hold] sodium chloride flush  3 mL Intravenous Q12H   [MAR Hold] sodium chloride flush  3 mL Intravenous Q12H   Continuous Infusions:  [MAR Hold] ceFEPime (MAXIPIME) IV 1 g (02/08/23 1857)   dextrose 5% lactated ringers 25 mL/hr at 02/09/23 0003   [MAR Hold] levETIRAcetam 250 mg (02/09/23 1232)   [MAR Hold] levETIRAcetam Stopped (02/09/23 0829)   [MAR Hold] vancomycin 750 mg (02/07/23 1620)     LOS: 6 days   Time spent: 15 minutes. No charge today.  Carollee Herter, DO  [facility]  02/09/2023, 4:09 PM

## 2023-02-09 NOTE — Anesthesia Procedure Notes (Signed)
Anesthesia Regional Block: Adductor canal block   Pre-Anesthetic Checklist: , timeout performed,  Correct Patient, Correct Site, Correct Laterality,  Correct Procedure, Correct Position, site marked,  Risks and benefits discussed,  Surgical consent,  Pre-op evaluation,  At surgeon's request and post-op pain management  Laterality: Right  Prep: chloraprep       Needles:  Injection technique: Single-shot  Needle Type: Stimiplex     Needle Length: 9cm  Needle Gauge: 21     Additional Needles:   Procedures:,,,, ultrasound used (permanent image in chart),,    Narrative:  Start time: 02/09/2023 3:10 PM End time: 02/09/2023 3:15 PM Injection made incrementally with aspirations every 5 mL.  Performed by: Personally  Anesthesiologist: Lowella Curb, MD

## 2023-02-09 NOTE — Assessment & Plan Note (Addendum)
02-09-2023 s/p right BKA. Management per ortho.  02-10-2023 family has decided on SNF at discharge. CM aware.  02-11-2023 awaiting SNF plans  02-12-2023 wife wants [facility] SNF. 02-13-2023 awaiting on CM.

## 2023-02-09 NOTE — Progress Notes (Addendum)
[facility] Progress Note  Subjective: No c/o today. Seen in HD. Went for angiogram yesterday. Looks like Dr [name] was consulted for R BKA which might be done today.   Vitals:   02/09/23 1505 02/09/23 1510 02/09/23 1515 02/09/23 1520  BP: (!) 147/62 (!) 128/57 (!) 124/54   Pulse: 62 66 (!) 44 (!) 53  Resp: 14 14 14 10   Temp:      TempSrc:      SpO2: 100% 97% 100% 99%  Weight:      Height:        Exam: Gen alert, no distress, chronically ill appearing Looks better No jvd or bruits Chest L clear, R dec'd at base RRR no RG Abd soft ntnd no mass or ascites +bs Ext no sig edema today Neuro is alert, Ox 3 , nf     LUA AVF+bruit       Renal-related home meds: - norvasc, coreg, hydralazine - phoslo 2 ac tid      OP HD: [facility]  3.5h  350/ 600   ? Dry wt   2/2.5 LUA AVF (from feb 2024, needs updating)     Assessment/ Plan: Diabetic foot infection - on IV abx, poss osteo per MRI. Per pmd/ podiatry. Angiogram showed small vessel disease. Looks like may be going for R BKA today.  ESKD - HD today (see below).  HD schedule: he is usually TTS. Had HD here last Friday, Monday and Wed (today). Next HD Sat to get back on schedule.  HTN - bp's good, cont home meds Volume - no vol to get off w/ HD today. As above.  Anemia of eskd - Hb 8- 9 here. Follow.  MBD ckd - CCa a bit high, phos in range. Hold any vdra. Cont binders w/ meals.      Vinson Moselle MD  [facility] 02/09/2023, 3:33 PM  Recent Labs  Lab 02/03/23 1115 02/04/23 0331 02/05/23 0620 02/07/23 0318 02/09/23 0323 02/09/23 1420  HGB 8.9*   < > 8.5* 8.1* 7.9* 9.9*  ALBUMIN 2.7*  --   --   --   --   --   CALCIUM 9.6   < > 8.9 8.7* 8.7*  --   PHOS  --   --  5.5*  --   --   --   CREATININE 18.57*   < > 10.81* 14.30* 9.40* 5.90*  K 4.4   < > 3.2* 4.0 4.1 4.0   < > = values in this interval not displayed.   No results for input(s): "IRON", "TIBC", "FERRITIN" in the last 168 hours. Inpatient  medications:  [MAR Hold] amiodarone  200 mg Oral Daily   [MAR Hold] amphetamine-dextroamphetamine  5 mg Oral Daily   [MAR Hold] atorvastatin  80 mg Oral QHS   [MAR Hold] calcium acetate  1,334 mg Oral TID WC   [MAR Hold] carvedilol  12.5 mg Oral BID   [MAR Hold] Chlorhexidine Gluconate Cloth  6 each Topical Q0600   [MAR Hold] Chlorhexidine Gluconate Cloth  6 each Topical Q0600   [MAR Hold] dorzolamide-timolol  1 drop Right Eye BID   [MAR Hold] mupirocin ointment  1 Application Nasal BID   [MAR Hold] nystatin   Topical TID   [MAR Hold] sodium chloride flush  3 mL Intravenous Q12H   [MAR Hold] sodium chloride flush  3 mL Intravenous Q12H    [MAR Hold] sodium chloride     [MAR Hold] ceFEPime (MAXIPIME) IV 1 g (02/08/23  1857)   dextrose 5% lactated ringers 25 mL/hr at 02/09/23 0003   [MAR Hold] levETIRAcetam 250 mg (02/09/23 1232)   [MAR Hold] levETIRAcetam Stopped (02/09/23 0829)   [MAR Hold] vancomycin 750 mg (02/07/23 1620)   [MAR Hold] sodium chloride, [MAR Hold] acetaminophen **OR** [MAR Hold] acetaminophen, [MAR Hold] camphor-menthol, [MAR Hold] hydrALAZINE, [MAR Hold] HYDROcodone-acetaminophen, [MAR Hold] labetalol, [MAR Hold] lidocaine, [MAR Hold] ondansetron (ZOFRAN) IV, [MAR Hold] oxyCODONE, [MAR Hold] sodium chloride flush

## 2023-02-09 NOTE — Progress Notes (Signed)
Report given to surgical short stay on patient. Patient is in dialysis at this time.

## 2023-02-09 NOTE — Progress Notes (Signed)
Patient is alert and oriented x4. Gave prn pain medication for scrotal pain. Wife at bedside. Has new R-BKA without output on wound vac. Patient is noncompliant and refused blood glucose checks. Had 2 runs of SVT with PVCs. Contacted MD on call. MD asked me to check patient's blood glucose-informed MD that patient had refused. Went into room a second time to explain patient's heart rhythm and how we needed to obtain blood glucose but he adamantly refuses. Reported 2nd refusal to MD.

## 2023-02-09 NOTE — Anesthesia Procedure Notes (Signed)
Anesthesia Regional Block: Popliteal block   Pre-Anesthetic Checklist: , timeout performed,  Correct Patient, Correct Site, Correct Laterality,  Correct Procedure, Correct Position, site marked,  Risks and benefits discussed,  Surgical consent,  Pre-op evaluation,  At surgeon's request and post-op pain management  Laterality: Right  Prep: chloraprep       Needles:  Injection technique: Single-shot  Needle Type: Stimiplex     Needle Length: 9cm  Needle Gauge: 21     Additional Needles:   Procedures:,,,, ultrasound used (permanent image in chart),,    Narrative:  Start time: 02/09/2023 3:11 PM End time: 02/09/2023 3:16 PM Injection made incrementally with aspirations every 5 mL.  Performed by: Personally  Anesthesiologist: Lowella Curb, MD

## 2023-02-09 NOTE — Progress Notes (Signed)
PROGRESS NOTE    [name].  ZOX:[id number] DOB: 1970/08/03 DOA: 02/03/2023 PCP: [name], FNP  Subjective: Pt seen in PACU. S/p right BKA. Pt sedated. Anesthesia attempting PIV access.   Hospital Course: HPI: [name]. is a 52 y.o. male with medical history significant of type 2 diabetes mellitus, acute osteomyelitis of toe of right foot with gangrene status post amputation of the great toe on the right foot, paroxysmal atrial fibrillation not on anticoagulation, hypertension, ESRD on HD, seizure disorder, BPH, diabetic polyneuropathy, GERD, gastroparesis, hypertension was sent to ED from podiatry office.  Per patient and his wife who is at the bedside, patient's wife noted a wound on his right foot on Sunday.  He called his podiatrist/Dr. Wagoner and was given an appointment to see today.  Being seen in the podiatry office today, he was noted to have significant pus and infected wound, he was directed to the emergency department for further workup and admission.  Patient denies any complaint, no pain.  Per wife, patient typically does not notice things himself until she does.  No reports of fever, chills, sweating, nausea, vomiting any other complaint or any foot trauma, any recent travel or sick contact.   ED Course: Hemodynamically stable upon arrival.  X-ray right foot unremarkable.  MRI ordered and pending.  Patient received cefepime, Flagyl and vancomycin in the ED and nephrology was consulted for dialysis and hospitalist were consulted for admission.  Significant Events: Admitted 02/03/2023 for diabetic right foot ulcer with concern for acute osteomyelitis   Significant Labs: Admission WBC 22.3, Bicarb 13, BUN 122, Scr 18.57  Significant Imaging Studies: MRI Right foot Substantial fluid/edema tracking within along the quadratus plantae, abductor hallucis, flexor digitorum brevis, third and fourth plantar and dorsal interosseous, and abductor digiti minimi  muscles, confluent with subcutaneous edema which is primarily infiltrative but which may have small fluid pockets tracking dorsal lateral to the fifth metatarsal, dorsal to the fourth metatarsal and into the fourth toe, and in the plantar subcutaneous tissues below the distal fourth and fifth metatarsals. Appearance concerning for active infection. 2. Cortical irregularity and mild marrow edema in the head of the fifth metatarsal suspicious for early osteomyelitis. 3. Previous small toe amputation at the MTP joint.  Antibiotic Therapy: Anti-infectives (From admission, onward)    Start     Dose/Rate Route Frequency Ordered Stop   02/08/23 1800  vancomycin (VANCOREADY) IVPB 750 mg/150 mL  Status:  Discontinued        750 mg 150 mL/hr over 60 Minutes Intravenous Every T-Th-Sa (Hemodialysis) 02/04/23 2042 02/07/23 1436   02/07/23 1530  vancomycin (VANCOREADY) IVPB 750 mg/150 mL        750 mg 150 mL/hr over 60 Minutes Intravenous Every M-W-F (Hemodialysis) 02/07/23 1436     02/05/23 0100  vancomycin (VANCOREADY) IVPB 750 mg/150 mL        750 mg 150 mL/hr over 60 Minutes Intravenous  Once 02/04/23 2042 02/05/23 0509   02/04/23 1800  ceFEPIme (MAXIPIME) 1 g in sodium chloride 0.9 % 100 mL IVPB        1 g 200 mL/hr over 30 Minutes Intravenous Every 24 hours 02/03/23 1401     12 /12/24 1330  vancomycin (VANCOCIN) IVPB 1000 mg/200 mL premix       Placed in "Followed by" Linked Group   1,000 mg 200 mL/hr over 60 Minutes Intravenous  Once 02/03/23 1224 02/03/23 1513   02/03/23 1230  metroNIDAZOLE (FLAGYL) IVPB 500 mg  500 mg 100 mL/hr over 60 Minutes Intravenous  Once 02/03/23 1221 02/03/23 1402   02/03/23 1230  ceFEPIme (MAXIPIME) 2 g in sodium chloride 0.9 % 100 mL IVPB        2 g 200 mL/hr over 30 Minutes Intravenous  Once 02/03/23 1224 02/03/23 1352   02/03/23 1230  vancomycin (VANCOCIN) IVPB 1000 mg/200 mL premix       Placed in "Followed by" Linked Group   1,000 mg 200 mL/hr over 60  Minutes Intravenous  Once 02/03/23 1224 02/03/23 1412       Procedures:   Consultants: Nephrology Vascular surgery orthopedics    Assessment and Plan: * Gangrene of right foot (HCC) On admission to 02-06-2023. Pt has osteomyelitis of right foot. Pt taken to OR on 02-04-2023 by podiatry. Pt had Partial fourth ray amputation, right foot.  Further resection of fifth metatarsal bone, right foot. [name] with orthopedics saw the patient and felt that pt had gangrene of the foot.  02-07-2023 pt will need right BKA by ortho. Planned on Wednesday Feb 09, 2023. 02-08-2023 angiogram today to see if he will need left lower extremity surgery.  S/P BKA (below knee amputation), right [facility]) - 02-09-2023 02-09-2023 s/p right BKA. Management per ortho.  PAD (peripheral artery disease) (HCC) 02-07-2023 pt noted to hypoglycemic today. Angiogram canceled.  Angiogram to make sure that he doesn't need left BKA at the same time. 02-08-2023 angiogram today to see if he will need left lower extremity surgery.   02-09-2023 angiogram on 02-08-2023 shows blood flow to left foot. "Inline flow to the foot with three-vessel runoff bilaterally but with microvascular disease and minimal filling pedal vessels bilaterally."  History of CVA (cerebrovascular accident) Chronic.  Coronary artery disease 02-07-2023 stable.  ESRD on hemodialysis (HCC) 02-07-2023 thru 02-19-2023 continue with HD per nephrology  Anemia due to chronic kidney disease, on chronic dialysis (HCC) 02-07-2023 stable.  Paroxysmal atrial fibrillation (HCC) 02-07-2023 continue with amiodarone 200 mg qday 02-08-2023 off systemic anticoagulation in preparation BKA  02-09-2023 will need to ask ortho when systemic anticoagulation can be resumed after his right BKA.  Uncontrolled type 2 diabetes mellitus with hyperglycemia, with long-term current use of insulin (HCC) 02-07-2023 CBG low today. Pt confused. Will give 1/2 amp D50 IV.  Hypoglycemia may be due to pt being NPO for his angiogram today. 02-08-2023 diabetic hypoglycemia improved with IVF with dextrose. 02-09-2023 continue D5 LR @ 25 ml/hr until po intake back to normal.  Essential hypertension, benign 02-07-2023 continue coreg 12.5 mg bid. 02-08-2023 stable. Continue coreg 02-09-2023 stable.       DVT prophylaxis: Place and maintain sequential compression device Start: 02/05/23 1515    Code Status: Full Code Family Communication: discussed with wife at bedside this AM Disposition Plan: SNF vs home Reason for continuing need for hospitalization: had BKA today.  Objective: Vitals:   02/09/23 1505 02/09/23 1510 02/09/23 1515 02/09/23 1520  BP: (!) 147/62 (!) 128/57 (!) 124/54   Pulse: 62 66 (!) 44 (!) 53  Resp: 14 14 14 10   Temp:      TempSrc:      SpO2: 100% 97% 100% 99%  Weight:      Height:        Intake/Output Summary (Last 24 hours) at 02/09/2023 1647 Last data filed at 02/09/2023 1606 Gross per 24 hour  Intake 460.95 ml  Output 20 ml  Net 440.95 ml   Filed Weights   02/09/23 0918 02/09/23 1309 02/09/23 1334  Weight:  80.3 kg 80.5 kg 80.5 kg    Examination:  Physical Exam Vitals and nursing note reviewed.  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  Pulmonary:     Effort: Pulmonary effort is normal.     Breath sounds: Normal breath sounds.  Musculoskeletal:     Comments: Right BKA     Data Reviewed: I have personally reviewed following labs and imaging studies  CBC: Recent Labs  Lab 02/03/23 1115 02/04/23 0331 02/05/23 0620 02/07/23 0318 02/09/23 0323 02/09/23 1420  WBC 22.3* 22.5* 22.6* 17.9* 16.1*  --   NEUTROABS 19.5*  --   --   --   --   --   HGB 8.9* 8.5* 8.5* 8.1* 7.9* 9.9*  HCT 28.7* 26.4* 25.3* 25.4* 25.5* 29.0*  MCV 73.4* 69.8* 69.1* 70.8* 72.6*  --   PLT 203 206 209 224 219  --     Basic Metabolic Panel: Recent Labs  Lab 02/03/23 1115 02/03/23 1120 02/04/23 0331 02/05/23 0620 02/07/23 0318 02/09/23 0323 02/09/23 1420  NA 141  --  138 139 135 136 134*  K 4.4  --  4.6 3.2* 4.0 4.1 4.0  CL 104  --  104 101 101 97* 97*  CO2 13*  --  15* 22 19* 24  --   GLUCOSE 82  --  99 90 103* 98 78  BUN 122*  --  132* 65* 105* 72* 38*  CREATININE 18.57*  --  18.70* 10.81* 14.30* 9.40* 5.90*  CALCIUM 9.6  --  8.8* 8.9 8.7* 8.7*  --   MG  --  2.4  --  1.8  --   --   --   PHOS  --   --   --  5.5*  --   --   --    GFR: Estimated Creatinine Clearance: 16.3 mL/min (A) (by C-G formula based on SCr of 5.9 mg/dL (H)). Liver Function Tests: Recent Labs  Lab 02/03/23 1115  AST 12*  ALT 10  ALKPHOS 65  BILITOT 0.9  PROT 6.9  ALBUMIN 2.7*   CBG: Recent Labs  Lab 02/07/23 1448 02/07/23 1643 02/07/23 2132 02/08/23 1406 02/09/23 1641  GLUCAP 141* 90 113* 89 80   Lipid Profile: Recent Labs    02/09/23 0323  CHOL 60  HDL 14*  LDLCALC 23  TRIG 147  CHOLHDL 4.3   Sepsis Labs: Recent Labs  Lab 02/03/23 1256  LATICACIDVEN 1.1    Recent Results (from the past 240 hours)  Culture, blood (routine x 2)     Status: None   Collection Time: 02/03/23 12:22 PM   Specimen: BLOOD RIGHT ARM  Result Value Ref Range Status   Specimen Description BLOOD RIGHT ARM  Final   Special Requests   Final    BOTTLES DRAWN AEROBIC AND ANAEROBIC Blood Culture results may not be optimal due to an inadequate volume of blood received in culture bottles   Culture   Final    NO GROWTH 5 DAYS Performed at [facility], [address], [place], [place] [address]    Report Status 02/08/2023 FINAL  Final  Culture, blood (routine x 2)     Status: None   Collection Time: 02/03/23 12:27 PM   Specimen: BLOOD RIGHT HAND  Result Value Ref Range Status   Specimen Description BLOOD RIGHT HAND  Final   Special Requests   Final    BOTTLES DRAWN AEROBIC AND ANAEROBIC Blood Culture results  may not be optimal due to an inadequate volume of blood received in culture bottles   Culture   Final    NO GROWTH 5 DAYS Performed at [facility], [address] [place], [place] [address]    Report Status 02/08/2023 FINAL  Final  Aerobic/Anaerobic Culture w Gram Stain (surgical/deep wound)     Status: None   Collection Time: 02/04/23 12:06 PM   Specimen: Foot, Right; Tissue  Result Value Ref Range Status   Specimen Description TISSUE  Final   Special Requests RIGHT FOOT  Final   Gram Stain   Final    FEW WBC PRESENT, PREDOMINANTLY PMN ABUNDANT GRAM POSITIVE COCCI IN PAIRS RARE GRAM NEGATIVE COCCOBACILLI    Culture   Final    ABUNDANT STAPHYLOCOCCUS AUREUS MODERATE CORYNEBACTERIUM STRIATUM Standardized susceptibility testing for this organism is not available. NO ANAEROBES ISOLATED Performed at [facility], [address], [place], [place] [address]    Report Status 02/09/2023 FINAL  Final   Organism ID, Bacteria STAPHYLOCOCCUS AUREUS  Final      Susceptibility   Staphylococcus aureus - MIC*    CIPROFLOXACIN <=0.5 SENSITIVE Sensitive     ERYTHROMYCIN RESISTANT Resistant     GENTAMICIN <=0.5 SENSITIVE Sensitive     OXACILLIN <=0.25 SENSITIVE Sensitive     TETRACYCLINE <=1 SENSITIVE Sensitive     VANCOMYCIN 1 SENSITIVE Sensitive     TRIMETH/SULFA <=10 SENSITIVE Sensitive     CLINDAMYCIN RESISTANT Resistant     RIFAMPIN <=0.5 SENSITIVE Sensitive     Inducible Clindamycin POSITIVE Resistant     LINEZOLID 2 SENSITIVE Sensitive     * ABUNDANT STAPHYLOCOCCUS AUREUS  Aerobic/Anaerobic Culture w Gram Stain (surgical/deep wound)     Status: None   Collection Time: 02/04/23 12:06 PM   Specimen: Foot, Right; Wound  Result Value Ref Range Status   Specimen Description WOUND  Final   Special Requests RIGHT FOOT  Final   Gram Stain   Final    MODERATE WBC PRESENT, PREDOMINANTLY PMN MODERATE GRAM POSITIVE COCCI    Culture   Final    ABUNDANT STAPHYLOCOCCUS  AUREUS SUSCEPTIBILITIES PERFORMED ON PREVIOUS CULTURE WITHIN THE LAST 5 DAYS. MODERATE CORYNEBACTERIUM STRIATUM Standardized susceptibility testing for this organism is not available. NO ANAEROBES ISOLATED Performed at [facility], [address], [place], [place] [address]    Report Status 02/09/2023 FINAL  Final  Surgical pcr screen     Status: Abnormal   Collection Time: 02/08/23  8:05 PM   Specimen: Nasal Mucosa; Nasal Swab  Result Value Ref Range Status   MRSA, PCR NEGATIVE NEGATIVE Final   Staphylococcus aureus POSITIVE (A) NEGATIVE Final    Comment: (NOTE) The Xpert SA Assay (FDA approved for NASAL specimens in patients 54 years of age and older), is one component of a comprehensive surveillance program. It is not intended to diagnose infection nor to guide or monitor treatment. Performed at [facility], [address] [place], [place] [address]      Radiology Studies: PERIPHERAL VASCULAR CATHETERIZATION Result Date: 02/09/2023 Images from the original result were not included.   Patient name: [name].  MRN: [id number]        DOB: Feb 11, 1971            Sex: male  02/03/2023 - 02/08/2023 Pre-operative Diagnosis: Bilateral foot wounds Post-operative  diagnosis:  Same Surgeon:  Daria Pastures, MD Procedure Performed:  Ultrasound-guided access of right common femoral artery Second-order cannulation of left external iliac artery Aortogram and bilateral lower extremity angiogram Closure of right common femoral artery with Mynx closure device  Indications: 52 year old male with a significant history of type 2 diabetes, A-fib, HTN, ESRD on HD who is status post toe amputation on the right foot which has been nonhealing with recurrent infection and osteomyelitis.  He is planned to undergo right below the knee amputation with [name] on 02/09/2023 and was noted to have discoloration and possibly ischemic skin changes to the left heel.  Risks and benefits of angiogram  with possible intervention were reviewed, he expressed understanding and is willing to proceed.  Findings: Widely patent aorta and bilateral renal arteries.  Widely patent bilateral iliac systems.   Left common femoral, profunda and SFA are widely patent without flow-limiting stenosis.  The popliteal artery and all 3 tibial vessels are patent but with medial calcinosis, no flow-limiting stenosis.  Three-vessel runoff to the foot.  There is microvascular disease in the foot and the pedal arch is noted to be absent.  Right common femoral, found and SFA are widely patent without flow-limiting stenosis.  The popliteal artery and all 3 tibial vessels are patent with medial calcinosis, no flow-limiting stenosis.  Three-vessel runoff to the foot but there is microvascular disease with minimal filling in pedal vessels.             Procedure:  The patient was identified in the holding area and taken to the cath lab  The patient was then placed supine on the table and prepped and draped in the usual sterile fashion.  A time out was called.  Ultrasound was used to evaluate the right common femoral artery.  It was patent .  A digital ultrasound image was acquired.  A micropuncture needle was used to access the right common femoral artery under ultrasound guidance.  An 018 wire was advanced without resistance and a micropuncture sheath was placed.  The 018 wire was removed and a benson wire was placed.  The micropuncture sheath was exchanged for a 5 french sheath.  An omniflush catheter was advanced over the wire to the level of L-1.  An abdominal angiogram was obtained.  Next, using the omniflush catheter and a glide advantage wire, the aortic bifurcation was crossed and the catheter was placed into theleft external iliac artery and left runoff was obtained. This demonstrated the above findings. right runoff was performed via retrograde sheath injections which demonstrated the above findings.  Contrast: 95 cc  Impression:  Inline flow to the foot with three-vessel runoff bilaterally but with microvascular disease and minimal filling pedal vessels bilaterally.   Daria Pastures MD [facility] Office: [phone]   CT HEAD WO CONTRAST ( ) Result Date: 02/07/2023 CLINICAL DATA:  Delirium EXAM: CT HEAD WITHOUT CONTRAST TECHNIQUE: Contiguous axial images were obtained from the base of the skull through the vertex without intravenous contrast. RADIATION DOSE REDUCTION: This exam was performed according to the departmental dose-optimization program which includes automated exposure control, adjustment of the mA and/or kV according to patient size and/or use of iterative reconstruction technique. COMPARISON:  None Available. FINDINGS: Brain: Bilateral chronic superior frontal lobe infarcts. No mass, hemorrhage or extra-axial collection. No hydrocephalus. Vascular: Calcific atherosclerosis at the skull base. Skull: Negative Sinuses/Orbits: Paranasal sinuses are clear.  Absent left lobe. Other: None IMPRESSION: 1. No acute intracranial  abnormality. 2. Chronic bilateral superior frontal lobe infarcts. Electronically Signed   By: Deatra Robinson M.D.   On: 02/07/2023 23:23    Scheduled Meds:  [MAR Hold] amiodarone  200 mg Oral Daily   [MAR Hold] amphetamine-dextroamphetamine  5 mg Oral Daily   [MAR Hold] atorvastatin  80 mg Oral QHS   [MAR Hold] calcium acetate  1,334 mg Oral TID WC   [MAR Hold] carvedilol  12.5 mg Oral BID   [MAR Hold] Chlorhexidine Gluconate Cloth  6 each Topical Q0600   [MAR Hold] Chlorhexidine Gluconate Cloth  6 each Topical Q0600   [MAR Hold] dorzolamide-timolol  1 drop Right Eye BID   [MAR Hold] mupirocin ointment  1 Application Nasal BID   [MAR Hold] nystatin   Topical TID   [MAR Hold] sodium chloride flush  3 mL Intravenous Q12H   [MAR Hold] sodium chloride flush  3 mL Intravenous Q12H   Continuous Infusions:  [MAR Hold] ceFEPime (MAXIPIME) IV 1 g (02/08/23 1857)    dextrose 5% lactated ringers 25 mL/hr at 02/09/23 0003   [MAR Hold] levETIRAcetam 250 mg (02/09/23 1232)   [MAR Hold] levETIRAcetam Stopped (02/09/23 0829)   [MAR Hold] vancomycin 750 mg (02/07/23 1620)     LOS: 6 days   Time spent: 40 minutes  Carollee Herter, DO  [facility]  02/09/2023, 4:47 PM

## 2023-02-09 NOTE — Anesthesia Procedure Notes (Signed)
Procedure Name: LMA Insertion Date/Time: 02/09/2023 3:34 PM  Performed by: Jimmey Ralph, CRNAPre-anesthesia Checklist: Patient identified, Emergency Drugs available, Suction available and Patient being monitored Patient Re-evaluated:Patient Re-evaluated prior to induction Oxygen Delivery Method: Circle System Utilized Preoxygenation: Pre-oxygenation with 100% oxygen Induction Type: IV induction Ventilation: Mask ventilation without difficulty LMA: LMA inserted LMA Size: 5.0 Number of attempts: 1 Placement Confirmation: positive ETCO2 Tube secured with: Tape Dental Injury: Teeth and Oropharynx as per pre-operative assessment

## 2023-02-09 NOTE — Anesthesia Postprocedure Evaluation (Signed)
Anesthesia Post Note  Patient: [name].  Procedure(s) Performed: RIGHT BELOW KNEE AMPUTATION (Right: Knee)     Patient location during evaluation: PACU Anesthesia Type: General Level of consciousness: awake and alert Pain management: pain level controlled Vital Signs Assessment: post-procedure vital signs reviewed and stable Respiratory status: spontaneous breathing, nonlabored ventilation, respiratory function stable and patient connected to nasal cannula oxygen Cardiovascular status: blood pressure returned to baseline and stable Postop Assessment: no apparent nausea or vomiting Anesthetic complications: no   No notable events documented.  Last Vitals:  Vitals:   02/09/23 1700 02/09/23 1715  BP: 117/60   Pulse: (!) 55   Resp: 12   Temp:  36.7 C  SpO2: 93%     Last Pain:  Vitals:   02/09/23 1635  TempSrc:   PainSc: Asleep                 Mariann Barter

## 2023-02-09 NOTE — Care Management Important Message (Signed)
Important Message  Patient Details  Name: [name]. MRN: [id number] Date of Birth: 11-Feb-1971   Important Message Given:  Yes - Medicare IM     Renie Ora 02/09/2023, 8:14 AM

## 2023-02-09 NOTE — Progress Notes (Signed)
  Progress Note    02/09/2023 8:17 AM 1 Day Post-Op  Subjective:  no complaints   Vitals:   02/09/23 0200 02/09/23 0316  BP: 122/66 123/63  Pulse: (!) 45 (!) 58  Resp:  13  Temp:  99.5 F (37.5 C)  SpO2: 94% 98%   Physical Exam: Lungs:  non labored Incisions:  R groin cath site without hematoma Extremities:  dressing left in place R foot Neurologic: A&O  CBC    Component Value Date/Time   WBC 16.1 (H) 02/09/2023 0323   RBC 3.51 (L) 02/09/2023 0323   HGB 7.9 (L) 02/09/2023 0323   HCT 25.5 (L) 02/09/2023 0323   PLT 219 02/09/2023 0323   MCV 72.6 (L) 02/09/2023 0323   MCH 22.5 (L) 02/09/2023 0323   MCHC 31.0 02/09/2023 0323   RDW 20.2 (H) 02/09/2023 0323   LYMPHSABS 0.7 02/03/2023 1115   MONOABS 1.7 (H) 02/03/2023 1115   EOSABS 0.0 02/03/2023 1115   BASOSABS 0.0 02/03/2023 1115    BMET    Component Value Date/Time   NA 136 02/09/2023 0323   K 4.1 02/09/2023 0323   CL 97 (L) 02/09/2023 0323   CO2 24 02/09/2023 0323   GLUCOSE 98 02/09/2023 0323   BUN 72 (H) 02/09/2023 0323   CREATININE 9.40 (H) 02/09/2023 0323   CALCIUM 8.7 (L) 02/09/2023 0323   GFRNONAA 6 (L) 02/09/2023 0323   GFRAA 5 (L) 08/27/2019 1540    INR    Component Value Date/Time   INR 1.1 03/01/2022 0745     Intake/Output Summary (Last 24 hours) at 02/09/2023 0817 Last data filed at 02/09/2023 0330 Gross per 24 hour  Intake 185.95 ml  Output --  Net 185.95 ml     Assessment/Plan:  52 y.o. male is s/p aortogram with BLE runoff 1 Day Post-Op   -R groin cath site well healed without hematoma -Angiography demonstrates inline flow to the foot with 3 vessel runoff bilaterally but with microvascular disease and minimal pedal vessel filling -He is considered optimized from a vascular surgery standpoint.  Unfortunately he is also at risk for limb loss of LLE   Emilie Rutter, PA-C [facility] [phone] 02/09/2023 8:17 AM

## 2023-02-09 NOTE — Anesthesia Preprocedure Evaluation (Addendum)
Anesthesia Evaluation  Patient identified by MRN, date of birth, ID band Patient awake    Reviewed: Allergy & Precautions, H&P , NPO status , Patient's Chart, lab work & pertinent test results  Airway Mallampati: II  TM Distance: >3 FB Neck ROM: Full    Dental no notable dental hx.    Pulmonary neg pulmonary ROS   Pulmonary exam normal breath sounds clear to auscultation       Cardiovascular hypertension, + CAD, + Past MI and + Peripheral Vascular Disease  negative cardio ROS Normal cardiovascular exam Rhythm:Regular Rate:Normal  Echo: 1. Left ventricular ejection fraction, by estimation, is 60 to 65%. The  left ventricle has normal function. The left ventricle has no regional  wall motion abnormalities. There is severe concentric left ventricular  hypertrophy.   2. Right ventricular systolic function is normal. The right ventricular  size is normal. Tricuspid regurgitation signal is inadequate for assessing  PA pressure.   3. The mitral valve is normal in structure. No evidence of mitral valve  regurgitation. No evidence of mitral stenosis. Severe mitral annular  calcification.   4. The aortic valve is normal in structure. Aortic valve regurgitation is  not visualized. Aortic valve sclerosis/calcification is present, without  any evidence of aortic stenosis.   5. The inferior vena cava is normal in size with greater than 50%  respiratory variability, suggesting right atrial pressure of 3 mmHg.   6. There is a moderate circumferential pericardial with greatest diameter  measuring 2.84cm. There is RA inversion but no RV diastolic collapse.  There is no significant respirophasic changes in MV inflow velocities. THe  IVC is not dilated. There is no  evidence of cardiac tamponade.   7. Compared to study dated 02/26/20, the pericardial effusion is slightly  larger. Also, with severe LVH would consider PYP scan to rule out   amyloidosis.     Neuro/Psych Seizures -,   Anxiety     CVA negative neurological ROS  negative psych ROS   GI/Hepatic negative GI ROS, Neg liver ROS,GERD  ,,  Endo/Other  negative endocrine ROSdiabetes    Renal/GU ESRF and DialysisRenal diseasenegative Renal ROS  negative genitourinary   Musculoskeletal negative musculoskeletal ROS (+) Arthritis ,    Abdominal   Peds negative pediatric ROS (+)  Hematology negative hematology ROS (+) Blood dyscrasia, anemia   Anesthesia Other Findings   Reproductive/Obstetrics negative OB ROS                             Anesthesia Physical Anesthesia Plan  ASA: 3  Anesthesia Plan: General   Post-op Pain Management: Regional block*   Induction: Intravenous  PONV Risk Score and Plan: 3 and Ondansetron, Dexamethasone, Midazolam and Treatment may vary due to age or medical condition  Airway Management Planned: LMA  Additional Equipment: None  Intra-op Plan:   Post-operative Plan: Extubation in OR  Informed Consent:   Plan Discussed with: CRNA  Anesthesia Plan Comments:        Anesthesia Quick Evaluation

## 2023-02-09 NOTE — Progress Notes (Signed)
Transport picked up patient for dialysis.

## 2023-02-09 NOTE — Plan of Care (Signed)
   Problem: Education: Goal: Knowledge of General Education information will improve Description: Including pain rating scale, medication(s)/side effects and non-pharmacologic comfort measures Outcome: Progressing   Problem: Health Behavior/Discharge Planning: Goal: Ability to manage health-related needs will improve Outcome: Progressing   Problem: Clinical Measurements: Goal: Ability to maintain clinical measurements within normal limits will improve Outcome: Progressing   Problem: Activity: Goal: Risk for activity intolerance will decrease Outcome: Progressing   Problem: Nutrition: Goal: Adequate nutrition will be maintained Outcome: Progressing   Problem: Pain Management: Goal: General experience of comfort will improve Outcome: Progressing   Problem: Safety: Goal: Ability to remain free from injury will improve Outcome: Progressing

## 2023-02-09 NOTE — Progress Notes (Signed)
SLP Cancellation Note  Patient Details Name: [name]. MRN: [id number] DOB: 12/02/1970   Cancelled treatment:       Reason Eval/Treat Not Completed: Medical issues which prohibited therapy (Pt NPO for R BKA scheduled for this date. Will continue efforts as appropriate.)  Clyde Canterbury, M.S., CCC-SLP Speech-Language Pathologist Secure Chat Preferred  O: [phone]  Woodroe Chen 02/09/2023, 7:58 AM

## 2023-02-09 NOTE — Progress Notes (Signed)
Patient brought back to 4E from OR. VSS. Wife present.  Kenard Gower, RN

## 2023-02-09 NOTE — Transfer of Care (Signed)
Immediate Anesthesia Transfer of Care Note  Patient: [name].  Procedure(s) Performed: RIGHT BELOW KNEE AMPUTATION (Right: Knee)  Patient Location: PACU  Anesthesia Type:General  Level of Consciousness: sedated and drowsy  Airway & Oxygen Therapy: Patient Spontanous Breathing  Post-op Assessment: Report given to RN and Post -op Vital signs reviewed and stable  Post vital signs: Reviewed and stable  Last Vitals:  Vitals Value Taken Time  BP 121/67 02/09/23 1635  Temp    Pulse 58 02/09/23 1639  Resp 12 02/09/23 1639  SpO2 100 % 02/09/23 1639  Vitals shown include unfiled device data.  Last Pain:  Vitals:   02/09/23 1334  TempSrc: Oral  PainSc:       Patients Stated Pain Goal: 1 (02/07/23 0500)  Complications: No notable events documented.

## 2023-02-09 NOTE — Op Note (Signed)
02/09/2023  4:12 PM  PATIENT:  [name].    PRE-OPERATIVE DIAGNOSIS:  Gangrene Right Foot  POST-OPERATIVE DIAGNOSIS:  Same  PROCEDURE:  RIGHT BELOW KNEE AMPUTATION Application of Kerecis micro graft 38 cm  Application of Prevena customizable and Prevena arthroform wound VAC dressings Application of Vive Wear stump shrinker and the [employer] limb protector  SURGEON:  [name], MD  ANESTHESIA:   General  PREOPERATIVE INDICATIONS:  [name]. is a  52 y.o. male with a diagnosis of Gangrene Right Foot who failed conservative measures and elected for surgical management.    The risks benefits and alternatives were discussed with the patient preoperatively including but not limited to the risks of infection, bleeding, nerve injury, cardiopulmonary complications, the need for revision surgery, among others, and the patient was willing to proceed.  OPERATIVE IMPLANTS:   Implant Name Type Inv. Item Serial No. Manufacturer Lot No. LRB No. Used Action  GRAFT SKIN WND MICRO 38 - [id number] Tissue GRAFT SKIN WND MICRO 38  [employer] [id number] Right 1 Implanted     OPERATIVE FINDINGS: Arteries calcified, muscle with poor color and contractility.  OPERATIVE PROCEDURE: Patient was brought to the operating room after undergoing a regional anesthetic.  After adequate levels anesthesia were obtained a thigh tourniquet was placed and the lower extremity was prepped using DuraPrep draped into a sterile field. The foot was draped out of the sterile field with impervious stockinette.  A timeout was called and the tourniquet inflated.  A transverse skin incision was made 12 cm distal to the tibial tubercle, the incision curved proximally, and a large posterior flap was created.  The tibia was transected just proximal to the skin incision and beveled anteriorly.  The fibula was transected just proximal to the tibial incision.  The sciatic nerve was pulled cut and allowed to  retract.  The vascular bundles were suture ligated with 2-0 silk.  The tourniquet was deflated and hemostasis obtained.        The Kerecis micro powder 38 cm was applied to the open wound that has a 200 cm surface area.   The deep and superficial fascial layers were closed using #1 Vicryl.  The skin was closed using staples.    The Prevena customizable dressing was applied this was overwrapped with the arthroform sponge.  [name] was used to secure the sponges and the circumferential compression was secured to the skin with Dermatac.  This was connected to the wound VAC pump and had a good suction fit this was covered with a stump shrinker and a limb protector.  Patient was taken to the PACU in stable condition.   DISCHARGE PLANNING:  Antibiotic duration: 24-hour antibiotics  Weightbearing: Nonweightbearing on the operative extremity  Pain medication: Opioid pathway  Dressing care/ Wound VAC: Continue wound VAC with the Prevena plus pump at discharge for 1 week  Ambulatory devices: Walker or kneeling scooter  Discharge to: Discharge planning based on recommendations per physical therapy  Follow-up: In the office 1 week after discharge.

## 2023-02-09 NOTE — Progress Notes (Signed)
   02/09/23 1309  Vitals  Temp 98.3 F (36.8 C)  Pulse Rate (!) 115  Resp 13  BP 138/65  SpO2 99 %  Weight 80.5 kg  Oxygen Therapy  Patient Activity (if Appropriate) In bed  Pulse Oximetry Type Continuous  Oximetry Probe Site Changed No  Post Treatment  Dialyzer Clearance Lightly streaked  Hemodialysis Intake (mL) 0 mL  Liters Processed 73.5  Fluid Removed (mL) 0 mL  Tolerated HD Treatment Yes  AVG/AVF Arterial Site Held (minutes) 5 minutes  AVG/AVF Venous Site Held (minutes) 5 minutes   Received patient in bed to unit.  Alert and oriented.  Informed consent signed and in chart.   TX duration:3.5hrs  Patient tolerated well.  Transported back to the room  Alert, without acute distress.  Hand-off given to patient's nurse.   Access used: LAVF Access issues: none  Total UF removed: 0 Medication(s) given: Keppra    [name]

## 2023-02-09 NOTE — Progress Notes (Signed)
CCC Pre-op Review 1.Surgical orders: Consent orders:   Consent signed:  Floor RN to have wife sign consent. Patient alert and oriented:  Only to self Antibiotic: Ancef 2G OCTOR Maxipime 02/08/23 1857 Pre-meds:  Profend   2.  Pre-procedure checklist completed:   3.  NPO:  MN  4.  CHG Bath completed:  Floor RN to complete after patient returns from dialysis      Belongings removed and placed in clean gown:  5.  Labs Performed: CBC    Abnormal 02/09/23  Hgb 7.9 CMP/BMP   Abnormal 02/09/23  Creat 10.8 Dialysis pt HA1C   Order placed, needs to be collected Type and Screen Order placed, needs to be collected EKG:    02/05/23 Chest x-ray:  10/08/22   6.  Recent H&P or progress note if inpatient: 02/09/23  7.  Language Barrier:  Oriented to self only  8.  Vital Signs:   Abnormal Oxygen:   RA Tele:  Can the patient travel without Tele  9.  Medications: Beta Blocker:   Carvedilol held this am, patient in dialysis  10. IV access:  Right hand 02/03/23 and right FA 02/03/23  11. Diabetic:   yes  CBG:    98  @      0323

## 2023-02-09 NOTE — Interval H&P Note (Signed)
History and Physical Interval Note:  02/09/2023 6:39 AM  [name].  has presented today for surgery, with the diagnosis of Gangrene Right Foot.  The various methods of treatment have been discussed with the patient and family. After consideration of risks, benefits and other options for treatment, the patient has consented to  Procedure(s): RIGHT BELOW KNEE AMPUTATION (Right) as a surgical intervention.  The patient's history has been reviewed, patient examined, no change in status, stable for surgery.  I have reviewed the patient's chart and labs.  Questions were answered to the patient's satisfaction.     [name]

## 2023-02-10 ENCOUNTER — Other Ambulatory Visit: Payer: Self-pay

## 2023-02-10 ENCOUNTER — Encounter (HOSPITAL_COMMUNITY): Payer: Self-pay | Admitting: Orthopedic Surgery

## 2023-02-10 DIAGNOSIS — I96 Gangrene, not elsewhere classified: Secondary | ICD-10-CM | POA: Diagnosis not present

## 2023-02-10 DIAGNOSIS — Z89511 Acquired absence of right leg below knee: Secondary | ICD-10-CM | POA: Diagnosis not present

## 2023-02-10 DIAGNOSIS — I10 Essential (primary) hypertension: Secondary | ICD-10-CM | POA: Diagnosis not present

## 2023-02-10 DIAGNOSIS — N186 End stage renal disease: Secondary | ICD-10-CM | POA: Diagnosis not present

## 2023-02-10 DIAGNOSIS — I480 Paroxysmal atrial fibrillation: Secondary | ICD-10-CM

## 2023-02-10 LAB — CBC WITH DIFFERENTIAL/PLATELET
Abs Immature Granulocytes: 0.41 10*3/uL — ABNORMAL HIGH (ref 0.00–0.07)
Basophils Absolute: 0 10*3/uL (ref 0.0–0.1)
Basophils Relative: 0 %
Eosinophils Absolute: 0 10*3/uL (ref 0.0–0.5)
Eosinophils Relative: 0 %
HCT: 28.8 % — ABNORMAL LOW (ref 39.0–52.0)
Hemoglobin: 8.6 g/dL — ABNORMAL LOW (ref 13.0–17.0)
Immature Granulocytes: 3 %
Lymphocytes Relative: 5 %
Lymphs Abs: 0.6 10*3/uL — ABNORMAL LOW (ref 0.7–4.0)
MCH: 22.3 pg — ABNORMAL LOW (ref 26.0–34.0)
MCHC: 29.9 g/dL — ABNORMAL LOW (ref 30.0–36.0)
MCV: 74.8 fL — ABNORMAL LOW (ref 80.0–100.0)
Monocytes Absolute: 0.4 10*3/uL (ref 0.1–1.0)
Monocytes Relative: 3 %
Neutro Abs: 11.4 10*3/uL — ABNORMAL HIGH (ref 1.7–7.7)
Neutrophils Relative %: 89 %
Platelets: 194 10*3/uL (ref 150–400)
RBC: 3.85 MIL/uL — ABNORMAL LOW (ref 4.22–5.81)
RDW: 20.2 % — ABNORMAL HIGH (ref 11.5–15.5)
WBC: 12.8 10*3/uL — ABNORMAL HIGH (ref 4.0–10.5)
nRBC: 0 % (ref 0.0–0.2)

## 2023-02-10 LAB — GLUCOSE, CAPILLARY
Glucose-Capillary: 103 mg/dL — ABNORMAL HIGH (ref 70–99)
Glucose-Capillary: 142 mg/dL — ABNORMAL HIGH (ref 70–99)
Glucose-Capillary: 197 mg/dL — ABNORMAL HIGH (ref 70–99)
Glucose-Capillary: 90 mg/dL (ref 70–99)
Glucose-Capillary: 91 mg/dL (ref 70–99)
Glucose-Capillary: 92 mg/dL (ref 70–99)

## 2023-02-10 LAB — COMPREHENSIVE METABOLIC PANEL
ALT: 7 U/L (ref 0–44)
AST: 10 U/L — ABNORMAL LOW (ref 15–41)
Albumin: 2 g/dL — ABNORMAL LOW (ref 3.5–5.0)
Alkaline Phosphatase: 67 U/L (ref 38–126)
Anion gap: 11 (ref 5–15)
BUN: 45 mg/dL — ABNORMAL HIGH (ref 6–20)
CO2: 25 mmol/L (ref 22–32)
Calcium: 8.4 mg/dL — ABNORMAL LOW (ref 8.9–10.3)
Chloride: 98 mmol/L (ref 98–111)
Creatinine, Ser: 6.73 mg/dL — ABNORMAL HIGH (ref 0.61–1.24)
GFR, Estimated: 9 mL/min — ABNORMAL LOW (ref >60)
Glucose, Bld: 149 mg/dL — ABNORMAL HIGH (ref 70–99)
Potassium: 3.7 mmol/L (ref 3.5–5.1)
Sodium: 134 mmol/L — ABNORMAL LOW (ref 135–145)
Total Bilirubin: 0.5 mg/dL (ref <1.2)
Total Protein: 6.3 g/dL — ABNORMAL LOW (ref 6.5–8.1)

## 2023-02-10 MED ORDER — SODIUM CHLORIDE 0.9% FLUSH: 3.0000 mL | Freq: Two times a day (BID) | INTRAVENOUS | Status: DC

## 2023-02-10 MED ORDER — GERHARDT'S BUTT CREAM
TOPICAL_CREAM | Freq: Three times a day (TID) | CUTANEOUS | Status: DC | PRN
  Administered 2023-02-13: 1 via TOPICAL
  Filled 2023-02-10 (×2): qty 60

## 2023-02-10 MED ORDER — SODIUM CHLORIDE 0.9% FLUSH
3.0000 mL | INTRAVENOUS | Status: DC | PRN
  Administered 2023-02-16: 10 mL via INTRAVENOUS

## 2023-02-10 MED ORDER — QUETIAPINE FUMARATE 25 MG PO TABS: 25.0000 mg | ORAL_TABLET | Freq: Every day | ORAL | Status: DC

## 2023-02-10 NOTE — Evaluation (Signed)
Occupational Therapy Evaluation Patient Details Name: [name]. MRN: [id number] DOB: 1970/05/20 Today's Date: 02/10/2023   History of Present Illness Pt is a 52 yr old male who presented 02/03/23 due to necrotic gangrenous R foot and ischemic changes to L heel.  Pt s/p 02/04/23 partial fourth ray amputation and resection of 5th metatarsal bone. 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 PMH ESRD  HTN, amputation L big toe, L eye absent, CVA, afib, Glaucoma, seizure,  PVD, CHF, CAD, chronic diarrhea,STEMI, osteomyelitis   Clinical Impression   Pt was seen with Physical Therapy at this time and wife was present in room. [name] reported that he was using a  rollator but then started to need an increase in care and assist. Pt at this time was limited due to pain reported 10/10 but they recently had pain medications. At this time he requires max to total assist for all ADLS at bed level. Wife is very concerned about the level of assistance at this time and agreeable to needing therapy prior to going home. Patient will benefit from continued inpatient follow up therapy, <3 hours/day.      If plan is discharge home, recommend the following: Two people to help with walking and/or transfers;Two people to help with bathing/dressing/bathroom;Assistance with cooking/housework;Assistance with feeding;Direct supervision/assist for medications management;Direct supervision/assist for financial management;Assist for transportation;Supervision due to cognitive status    Functional Status Assessment  Patient has had a recent decline in their functional status and demonstrates the ability to make significant improvements in function in a reasonable and predictable amount of time.  Equipment Recommendations  Hospital bed;Wheelchair cushion (measurements OT);Wheelchair (measurements OT) (slidding board- if they go home)    Recommendations for Other Services       Precautions / Restrictions  Precautions Precautions: Fall Precaution Comments: R BKA 12/18 with limb protector; watch BP (hx of syncope); wound vac R leg Restrictions Weight Bearing Restrictions Per Provider Order: Yes RLE Weight Bearing Per Provider Order: Non weight bearing Other Position/Activity Restrictions: limb protector      Mobility Bed Mobility Overal bed mobility: Needs Assistance Bed Mobility: Rolling, Supine to Sit, Sit to Supine Rolling: Min assist, Used rails   Supine to sit: Total assist, HOB elevated, +2 for physical assistance, +2 for safety/equipment Sit to supine: Total assist, +2 for physical assistance, +2 for safety/equipment, HOB elevated   General bed mobility comments: Pt able to roll to R pulling on bed rail with minA at trunk to rotate to place bed pan. Pt did not initiate and assist well with transition supine <> sit EOB, total assist x2 needed. Provided step-by-step cues and assistance to slide each leg proximal to L EOB, reach R hand towards L rail, and slide hips towards L EOB using bed pad to sit up. Pt immediately extended his trunk upon sitting up, needing assist at trunk and blocking at knees to prevent pt from sliding off EOB. Cues also provided to lean to L elbow to descend trunk when returning to supine.    Transfers                   General transfer comment: deferred this date due to drop in BP and 10/10 pain      Balance Overall balance assessment: Needs assistance Sitting-balance support: Bilateral upper extremity supported, Feet supported Sitting balance-Leahy Scale: Poor Sitting balance - Comments: Max-total assist needed to support his trunk due to pt leaning posteriorly. Assist needed to flex L knee to place  L foot on ground to also assist in preventing pt from sliding off EOB. Postural control: Posterior lean     Standing balance comment: deferred this date due to drop in BP and 10/10 pain                           ADL either performed or  assessed with clinical judgement   ADL Overall ADL's : Needs assistance/impaired Eating/Feeding: Moderate assistance;Maximal assistance;Sitting   Grooming: Wash/dry hands;Wash/dry face;Maximal assistance;Sitting   Upper Body Bathing: Maximal assistance;Total assistance;Bed level   Lower Body Bathing: Total assistance;Bed level;+2 for physical assistance;+2 for safety/equipment   Upper Body Dressing : Maximal assistance;Total assistance;Bed level   Lower Body Dressing: Maximal assistance;Total assistance;Bed level       Toileting- Clothing Manipulation and Hygiene: Total assistance;Bed level         General ADL Comments: deffered all mobility due to bp/balance at [facility]     Vision Patient Visual Report:  (unable to see out of L eye but that is baseline)       Perception         Praxis         Pertinent Vitals/Pain Pain Assessment Pain Assessment: 0-10 Pain Score: 10-Worst pain ever Breathing: normal Negative Vocalization: none Facial Expression: smiling or inexpressive Body Language: relaxed Consolability: no need to console PAINAD Score: 0 Pain Location: R leg, groin Pain Descriptors / Indicators: Discomfort, Grimacing, Guarding, Operative site guarding Pain Intervention(s): Limited activity within patient's tolerance, Monitored during session, Repositioned, Patient requesting pain meds-RN notified     Extremity/Trunk Assessment Upper Extremity Assessment Upper Extremity Assessment: RUE deficits/detail;LUE deficits/detail;Left hand dominant RUE Deficits / Details: Pt for BUE reported they are very weak in BUE and can not move much and when asked to complete AROM completed about 25 degrees in forward flexion but with tasks will increase to about 50-60 with RUE. Noted to have some edema. Pt does not have 4th digit RUE Sensation: WNL RUE Coordination: decreased gross motor;decreased fine motor LUE Deficits / Details: Pt at this time hx of CVA with trace movements of  LUE. Uncleat if there was neglect onto that side as they do not like to move much at all. LUE Sensation: decreased light touch;decreased proprioception LUE Coordination: decreased fine motor;decreased gross motor   Lower Extremity Assessment Lower Extremity Assessment: Defer to PT evaluation RLE Deficits / Details: BKA 12/18, reports decreased sensation distally, gross weakness noted with poor initiation by pt to move and lift leg off bed (often needed assistance to do so) RLE Sensation: decreased light touch LLE Deficits / Details: hx of CVA affecting L side, gross weakness with MMT scores </= 4-, noted discoloration plantar aspects of toes but no wounds at foot, reports sensation is intact   Cervical / Trunk Assessment Cervical / Trunk Assessment: Normal   Communication Communication Communication: No apparent difficulties   Cognition Arousal: Alert Behavior During Therapy: Flat affect Overall Cognitive Status: Difficult to assess                                 General Comments: Pt with flat affect throughout and initially resistive to mobility attempts but agreeable with encouragement. Needed encouragement to use his arms to assist with mobility. Needed cues to sequence all mobility. Unsure if poor initiation and active participation is personality. Unsure if baseline or not, may benefit from further assessment.  General Comments  BP - 107/62 supine, 76/63 sitting EOB, 103/65 supine end of session - pt symptomatic; educated pt and wife on pressure relief schedule and on mobilizing legs and using bed features to sit up as able and as BP remains stable    Exercises     Shoulder Instructions      Home Living Family/patient expects to be discharged to:: Private residence Living Arrangements: Spouse/significant other Available Help at Discharge: Family;Available PRN/intermittently Type of Home: House Home Access: Ramped entrance     Home Layout: One level      Bathroom Shower/Tub: Walk-in shower;Sponge bathes at baseline (at sink or bedside)   Bathroom Toilet: Standard Bathroom Accessibility: No (able to get walker into bathroom but can not turn around)   Home Equipment: Rollator (4 wheels);Rolling Walker (2 wheels);BSC/3in1          Prior Functioning/Environment Prior Level of Function : Needs assist       Physical Assist : Mobility (physical)     Mobility Comments: walks with rollator ADLs Comments: wife began to assist with dressing and bathing recently due to pt having more difficulty with hygeine recently        OT Problem List: Decreased strength;Decreased range of motion;Decreased activity tolerance;Impaired balance (sitting and/or standing);Decreased safety awareness;Decreased knowledge of use of DME or AE;Pain      OT Treatment/Interventions: Self-care/ADL training;DME and/or AE instruction;Therapeutic activities;Cognitive remediation/compensation;Patient/family education;Balance training    OT Goals(Current goals can be found in the care plan section) Acute Rehab OT Goals Patient Stated Goal: to sit up OT Goal Formulation: With patient Time For Goal Achievement: 02/24/23 Potential to Achieve Goals: Fair ADL Goals Pt Will Perform Eating: with modified independence;with adaptive utensils Pt Will Perform Grooming: with modified independence;sitting Pt Will Perform Upper Body Bathing: sitting;with min assist Pt Will Perform Lower Body Bathing: with max assist;sitting/lateral leans Pt Will Transfer to Toilet: with max assist;bedside commode  OT Frequency: Min 1X/week    Co-evaluation PT/OT/SLP Co-Evaluation/Treatment: Yes Reason for Co-Treatment: Complexity of the patient's impairments (multi-system involvement);Necessary to address cognition/behavior during functional activity;For patient/therapist safety;To address functional/ADL transfers PT goals addressed during session: Mobility/safety with mobility;Balance OT  goals addressed during session: ADL's and self-care;Proper use of Adaptive equipment and DME;Strengthening/ROM      AM-PAC OT "6 Clicks" Daily Activity     Outcome Measure Help from another person eating meals?: A Lot Help from another person taking care of personal grooming?: A Lot Help from another person toileting, which includes using toliet, bedpan, or urinal?: Total Help from another person bathing (including washing, rinsing, drying)?: Total Help from another person to put on and taking off regular upper body clothing?: A Lot Help from another person to put on and taking off regular lower body clothing?: Total 6 Click Score: 9   End of Session Nurse Communication: Mobility status  Activity Tolerance: Patient limited by pain;Patient limited by lethargy Patient left: in bed;with call bell/phone within reach;with family/visitor present;with bed alarm set  OT Visit Diagnosis: Unsteadiness on feet (R26.81);Other abnormalities of gait and mobility (R26.89);Repeated falls (R29.6);Muscle weakness (generalized) (M62.81);Pain;Hemiplegia and hemiparesis Hemiplegia - Right/Left: Left Hemiplegia - dominant/non-dominant: Dominant Pain - Right/Left: Right Pain - part of body: Leg                Time: 1610-9604 OT Time Calculation (min): 33 min Charges:  OT General Charges $OT Visit: 1 Visit OT Evaluation $OT Eval Moderate Complexity: 1 Mod OT Treatments $Self Care/Home Management : 8-22 mins  Morrell Riddle  K OTR/L  Acute Rehab Services  [phone] office number   Alphia Moh 02/10/2023, 12:15 PM

## 2023-02-10 NOTE — Progress Notes (Signed)
Placed pt on air-mattress bed.   Lawson Radar, RN

## 2023-02-10 NOTE — [place] FL2 (Signed)
[place] MEDICAID FL2 LEVEL OF CARE FORM     IDENTIFICATION  Patient Name: [name]. Birthdate: [birth year] Sex: male Admission Date (Current Location): 02/03/2023  [place] and [place] Number:  [employer] and Address:  [facility], [address] [place], [place] [address]      Provider Number: [id number]  Attending Physician Name and Address:  Carollee Herter, DO  Relative Name and Phone Number:       Current Level of Care: Hospital Recommended Level of Care: Skilled Nursing Facility Prior Approval Number:    Date Approved/Denied:   PASRR Number: [id number]  Discharge Plan: SNF    Current Diagnoses: Patient Active Problem List   Diagnosis Date Noted   S/P BKA (below knee amputation), right (HCC) - 02-09-2023 02/09/2023   Gangrene of right foot (HCC) 02/07/2023   Pyogenic inflammation of bone (HCC) 02/04/2023   Diabetic foot infection (HCC) 02/04/2023   H/O spontaneous intraventricular intracranial hemorrhage 10/2021 02/26/2022   History of CVA (cerebrovascular accident) 02/26/2022   H/O enucleation of left eyeball 02/26/2022   PAD (peripheral artery disease) (HCC) 02/26/2022   History of seizure 02/26/2022   Seasonal allergic rhinitis 07/08/2021   Myoclonus 06/23/2021   Exposure of orbital implant 03/16/2021   Steal syndrome as complication of dialysis access (HCC) 02/06/2021   Coronary artery disease 02/05/2021   ESRD on hemodialysis (HCC)    Anemia due to chronic kidney disease, on chronic dialysis (HCC)    Paroxysmal atrial fibrillation (HCC) 11/24/2020   Abnormal gait 08/19/2020   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) 08/19/2020   Anxiety state 08/19/2020   Callosity 08/19/2020   ED (erectile dysfunction) of organic origin 08/19/2020   Heart failure (HCC) 08/19/2020   Long term (current) use of insulin (HCC) 08/19/2020   Myalgia 08/19/2020   CAD/ H/o prior STEMI 02/2020 s/p DES to LAD 08/19/2020   Blindness  of left eye 08/15/2020   Combined forms of age-related cataract of right eye 04/17/2020   Benign prostatic hyperplasia with lower urinary tract symptoms 11/07/2019   Hypertension secondary to other renal disorders 07/20/2019   NVG (neovascular glaucoma), left, 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   Moderate protein-calorie malnutrition (HCC) 05/14/2019   Chronic anemia 05/07/2019   Coagulation defect, unspecified (HCC) 05/07/2019   Disorder of phosphorus metabolism, unspecified 05/07/2019   Iron deficiency anemia, unspecified 05/07/2019   Pruritus, unspecified 05/07/2019   Proteinuria 11/28/2018   Secondary hyperparathyroidism of renal origin (HCC) 11/28/2018   Chronic Grade 2 diastolic heart failure/EF 55 to 60% 12/13/2017   Diabetes mellitus, type II (HCC) 11/23/2017   Diabetic retinopathy (HCC) 07/05/2017   Uncontrolled type 2 diabetes mellitus with hyperglycemia, with long-term current use of insulin (HCC) 04/20/2016   Mixed hyperlipidemia    Essential hypertension, benign     Orientation RESPIRATION BLADDER Height & Weight     Self, Time, Situation, Place  Normal Continent Weight: 176 lb 5.9 oz (80 kg) Height:  6\' 3"  (190.5 cm)  BEHAVIORAL SYMPTOMS/MOOD NEUROLOGICAL BOWEL NUTRITION STATUS      Incontinent Diet (please see discharge summary)  AMBULATORY STATUS COMMUNICATION OF NEEDS Skin   Extensive Assist Verbally Surgical wounds (pressure injury scrotum med stage 2, closed incision foot)                       Personal Care Assistance Level of Assistance  Bathing, Feeding, Dressing Bathing Assistance: Maximum assistance Feeding assistance: Independent Dressing Assistance: Maximum assistance  Functional Limitations Info  Sight, Hearing, Speech Sight Info: Impaired Hearing Info: Adequate Speech Info: Adequate    SPECIAL CARE FACTORS FREQUENCY  PT (By licensed PT), OT (By licensed OT)     PT Frequency: 5x per wek OT Frequency: 5x per week             Contractures Contractures Info: Not present    Additional Factors Info  Code Status, Allergies Code Status Info: FULL Allergies Info: Promethazine,Promethazine Hcl,           Current Medications (02/10/2023):  This is the current hospital active medication list Current Facility-Administered Medications  Medication Dose Route Frequency Provider Last Rate Last Admin   0.9 %  sodium chloride infusion   Intravenous Continuous [name], MD 10 mL/hr at 02/09/23 1830 New Bag at 02/09/23 1830   acetaminophen (TYLENOL) tablet 650 mg  650 mg Oral Q6H PRN [name], MD       Or   acetaminophen (TYLENOL) suppository 650 mg  650 mg Rectal Q6H PRN [name], MD       amiodarone (PACERONE) tablet 200 mg  200 mg Oral Daily [name], MD   200 mg at 02/10/23 7425   amphetamine-dextroamphetamine (ADDERALL XR) 24 hr capsule 5 mg  5 mg Oral Daily [name], MD   5 mg at 02/10/23 9563   ascorbic acid (VITAMIN C) tablet 1,000 mg  1,000 mg Oral Daily [name], MD   1,000 mg at 02/10/23 0955   atorvastatin (LIPITOR) tablet 80 mg  80 mg Oral QHS [name], MD   80 mg at 02/09/23 2021   calcium acetate (PHOSLO) capsule 1,334 mg  1,334 mg Oral TID WC [name], MD   1,334 mg at 02/10/23 0955   camphor-menthol (SARNA) lotion   Topical PRN [name], MD   Given at 02/06/23 1742   carvedilol (COREG) tablet 12.5 mg  12.5 mg Oral BID [name], MD   12.5 mg at 02/10/23 0955   Chlorhexidine Gluconate Cloth 2 % PADS 6 each  6 each Topical Q0600 [name], MD   6 each at 02/10/23 0500   Chlorhexidine Gluconate Cloth 2 % PADS 6 each  6 each Topical Q0600 [name], MD   6 each at 02/10/23 0500   dorzolamide-timolol (COSOPT) 2-0.5 % ophthalmic solution 1 drop  1 drop Right Eye BID [name], MD   1 drop at 02/10/23 1013   Gerhardt's butt cream   Topical TID PRN Carollee Herter, DO       hydrALAZINE (APRESOLINE) injection 5 mg  5 mg Intravenous Q20 Min PRN  [name], MD       HYDROcodone-acetaminophen (NORCO/VICODIN) 5-325 MG per tablet 1-2 tablet  1-2 tablet Oral Q6H PRN [name], MD   2 tablet at 02/09/23 2015   HYDROmorphone (DILAUDID) injection 0.5-1 mg  0.5-1 mg Intravenous Q4H PRN [name], MD       labetalol (NORMODYNE) injection 10 mg  10 mg Intravenous Q10 min PRN [name], MD       levETIRAcetam (KEPPRA) 250 mg in sodium chloride 0.9 % 100 mL IVPB  250 mg Intravenous Q M,W,F-HD [name], MD 410 mL/hr at 02/09/23 1232 250 mg at 02/09/23 1232   levETIRAcetam (KEPPRA) IVPB 500 mg/100 mL premix  500 mg Intravenous QPC breakfast [name], MD 400 mL/hr at 02/10/23 1024 500 mg  at 02/10/23 1024   lidocaine (XYLOCAINE) 2 % jelly 1 Application  1 Application Topical Q6H PRN [name], MD       magnesium sulfate IVPB 2 g 50 mL  2 g Intravenous Daily PRN [name], MD       mupirocin ointment (BACTROBAN) 2 % 1 Application  1 Application Nasal BID [name], MD   1 Application at 02/09/23 2020   nutrition supplement (JUVEN) (JUVEN) powder packet 1 packet  1 packet Oral BID BM [name], MD   1 packet at 02/10/23 1011   nystatin (MYCOSTATIN/NYSTOP) topical powder   Topical TID [name], MD   Given at 02/10/23 1012   ondansetron (ZOFRAN) injection 4 mg  4 mg Intravenous Q6H PRN [name], MD       oxyCODONE (Oxy IR/ROXICODONE) immediate release tablet 5 mg  5 mg Oral Q4H PRN [name], MD   5 mg at 02/10/23 1010   potassium chloride SA (KLOR-CON M) CR tablet 20-40 mEq  20-40 mEq Oral Daily PRN [name], MD       sodium chloride flush (NS) 0.9 % injection 3 mL  3 mL Intravenous Q12H [name], MD   3 mL at 02/09/23 2018   sodium chloride flush (NS) 0.9 % injection 3 mL  3 mL Intravenous Q12H [name], MD   3 mL at 02/09/23 2015   sodium chloride flush (NS) 0.9 % injection 3 mL  3 mL Intravenous PRN [name], MD       vancomycin (VANCOREADY) IVPB 750 mg/150 mL  750  mg Intravenous Q M,W,F-HD [name], MD 150 mL/hr at 02/07/23 1620 750 mg at 02/07/23 1620   zinc sulfate (50mg  elemental zinc) capsule 220 mg  220 mg Oral Daily [name], MD   220 mg at 02/10/23 1610     Discharge Medications: Please see discharge summary for a list of discharge medications.  Relevant Imaging Results:  Relevant Lab Results:   Additional Information SSN [id number]   [facility] [place] [facility]  Eduard Roux, LCSW

## 2023-02-10 NOTE — Evaluation (Signed)
Clinical/Bedside Swallow Evaluation Patient Details  Name: [name]. MRN: [id number] Date of Birth: 12-17-1970  Today's Date: 02/10/2023 Time: SLP Start Time (ACUTE ONLY): 9562 SLP Stop Time (ACUTE ONLY): 1014 SLP Time Calculation (min) (ACUTE ONLY): 21 min  Past Medical History:  Past Medical History:  Diagnosis Date   Acute Bil  CVAs of Bil Frontal amd Rt Parietal  in Watershed Distribution 04/21/2021   Patchy acute cortically-based infarcts within the bilateral high  frontal lobes and right parietal lobe (predominantly in a watershed  distribution).   Subcentimeter acute infarct within the callosal splenium on the  left.     Acute bilateral cerebral infarction in a watershed distribution [facility]) 04/21/2021   a.) MRI brain 04/21/2021 --> patchy ACUTE cortically-based infarcts within the bilateral high frontal lobes and right parietal lobe   Acute cerebral infarction (HCC) 02/13/2021   a.) MRI brain 02/13/2021: acute LEFT hippocampal infarct   Acute cerebral infarction (HCC) 04/21/2021   a.) MRI brain 04/21/2021: subcentimeter ACUTE infarct within the callosal splenium on the LEFT   Acute osteomyelitis of toe of right foot with gangrene (HCC) 02/26/2022   Anaphylactic reaction due to adverse effect of correct drug or medicament properly administered, initial encounter 05/08/2019   Anemia of chronic renal failure    Anxiety    Aortic dilatation (HCC) 11/25/2020   a.) TTE 11/25/2020: Ao root measured 38 mm. b.) TTE 04/22/2021: Ao root measured 44 mm; c. 11/2021 Echo: Ao root 40mm.   Atrial fibrillation (HCC)    a.) CHA2DS2-VASc = 6 (CHF, HTN, CVA x2, prior MI, T2DM). b.) rate/rhythm maintained on oral amiodarone + carvedilol; chronic OAC/AP therapy discontinued following ICH   BPH (benign prostatic hyperplasia)    Cardiac arrest (HCC) 07/26/2021   a.) in setting of hyperkalemia, NSTEMI, and seizure following missed HD; pulseless and apneic --> required 1 round of CPR prior to ROSC    Cellulitis    Cerebral hemorrhage (HCC) 11/21/2021   a.) CT head 11/21/2021 --> 1.4 x 1.5 x 3.1 cm ACUTE hemorrhage within the inferior aspect of the fourth ventricle, and extending inferiorly through the foramen of foramen of [place] --> occurred in setting of HTN emergency and DOAC/APT-->eliquis/plavix d/c'd.   Cerebral microvascular disease    Chronic diarrhea    Chronic heart failure with preserved ejection fraction (HFpEF) (HCC) 07/19/2017   a.) 06/2017 Echo: EF 75%; b.) 11/2020 Echo: EF 55-60%; c.) 04/2021 Echo: EF 55-60%; d.) 11/2021 Echo: EF 65-70%, GrII DD, nl RV fxn, sev dil LA, large circumferential pericardial eff w/o tamponade, triv MR, AoV sclerosis. Ao root 40mm; e.) TTE 02/28/2025: EF 60-65%, sev LVH, mod posterior LV effusion, AoV sclerosis   Chronic pain of both ankles    Coronary artery disease 02/26/2020   a. 02/2020 Ant STEMI/PCI: LM nl, LAD 61m (3.5x30 Resolute Onyx), RI nl, LCX min irregs, RCA min irregs. EF 65%.   DDD (degenerative disc disease), cervical    Diabetes mellitus, type II (HCC)    Diabetic neuropathy (HCC)    Esophagitis    ESRD (end stage renal disease) on dialysis (HCC)    a.) [facility]; T[facility]Th-Sat   Frequent falls    Gait instability    Gastritis    Gastroparesis    GERD (gastroesophageal reflux disease)    H/O enucleation of left eyeball    Heart palpitations    History of kidney stones    HLD (hyperlipidemia)    Hypertension    Insomnia    a.) uses melatonin PRN  Long term current use of amiodarone    Nausea and vomiting in adult    recurrent   NSTEMI (non-ST elevated myocardial infarction) (HCC) 07/26/2021   a.) hyperkalemic from missed HD; seizures; decompensated to cardiac arrest and required CPR x 1 round prior to ROSC.   Osteomyelitis (HCC)    Pericardial effusion    a. 11/2021 Echo: EF 65-70%, GrII DD, nl RV fxn, sev dil LA, large circumferential pericardial eff w/o tamponade, triv MR, AoV sclerosis. Ao root 40mm.   Seizure (HCC)     a.) last 07/26/2021 in setting of missed HD --> hyperkalemic at 6.5 --> pulseless/apneic and required CPR; discharged home on levetiracetam.   ST elevation myocardial infarction (STEMI) of anterior wall (HCC) 02/26/2020   a.) LHC/PCI 02/26/2020 --> EF 65%; LVEDP 11 mmHg; 90% mLAD (3.5 x 20 mm Resolute Onyx DES x 1)   Past Surgical History:  Past Surgical History:  Procedure Laterality Date   A/V FISTULAGRAM Left 08/31/2021   Procedure: A/V Fistulagram;  Surgeon: Annice Needy, MD;  Location: [name] INVASIVE CV LAB;  Service: Cardiovascular;  Laterality: Left;   A/V FISTULAGRAM Left 01/25/2022   Procedure: A/V Fistulagram;  Surgeon: Annice Needy, MD;  Location: [name] INVASIVE CV LAB;  Service: Cardiovascular;  Laterality: Left;   A/V FISTULAGRAM Right 10/18/2022   Procedure: A/V Fistulagram;  Surgeon: Annice Needy, MD;  Location: [name] INVASIVE CV LAB;  Service: Cardiovascular;  Laterality: Right;   ABDOMINAL AORTOGRAM W/LOWER EXTREMITY Bilateral 02/08/2023   Procedure: ABDOMINAL AORTOGRAM W/LOWER EXTREMITY;  Surgeon: Daria Pastures, MD;  Location: [facility] INVASIVE CV LAB;  Service: Vascular;  Laterality: Bilateral;   AMPUTATION Left 03/16/2016   Procedure: AMPUTATION DIGIT LEFT HALLUX;  Surgeon: Vivi Barrack, DPM;  Location: [facility] OR;  Service: Podiatry;  Laterality: Left;  can start around 5    AMPUTATION Right 02/09/2021   Procedure: AMPUTATION DIGIT;  Surgeon: Marlyne Beards, MD;  Location: [facility] OR;  Service: Orthopedics;  Laterality: Right;   AMPUTATION Right 02/04/2023   Procedure: AMPUTATION 4TH AND 5TH METATARSAL;  Surgeon: Pilar Plate, DPM;  Location: [facility] OR;  Service: Orthopedics/Podiatry;  Laterality: Right;  I&D, partial ray resection, abx beads   AMPUTATION Right 02/09/2023   Procedure: RIGHT BELOW KNEE AMPUTATION;  Surgeon: [name], MD;  Location: [facility] OR;  Service: Orthopedics;  Laterality: Right;   AMPUTATION TOE Right 02/28/2022   Procedure: AMPUTATION TOE;  Surgeon:  Louann Sjogren, DPM;  Location: [name] ORS;  Service: Podiatry;  Laterality: Right;  right fifth toe   ARTHROSCOPIC REPAIR ACL Left    AV FISTULA PLACEMENT Right 05/31/2019   Procedure: Brachiocephalic AV fistula creation;  Surgeon: Annice Needy, MD;  Location: [name] ORS;  Service: Vascular;  Laterality: Right;   AV FISTULA PLACEMENT Left 08/13/2021   Procedure: ARTERIOVENOUS (AV) FISTULA CREATION (BRACHIALCEPHALIC);  Surgeon: Annice Needy, MD;  Location: [name] ORS;  Service: Vascular;  Laterality: Left;   COLONOSCOPY WITH PROPOFOL N/A 10/28/2015   Procedure: COLONOSCOPY WITH PROPOFOL;  Surgeon: Christena Deem, MD;  Location: [facility] ENDOSCOPY;  Service: Endoscopy;  Laterality: N/A;   COLONOSCOPY WITH PROPOFOL N/A 10/29/2015   Procedure: COLONOSCOPY WITH PROPOFOL;  Surgeon: Christena Deem, MD;  Location: [facility] ENDOSCOPY;  Service: Endoscopy;  Laterality: N/A;   COLONOSCOPY WITH PROPOFOL N/A 01/27/2023   Procedure: COLONOSCOPY WITH PROPOFOL;  Surgeon: Franky Macho, MD;  Location: [facility] ENDO SU[facility];  Service: Endoscopy;  Laterality: N/A;  12:30pm, asa 3   CORONARY ULTRASOUND/IVUS  N/A 02/26/2020   Procedure: Intravascular Ultrasound/IVUS;  Surgeon: Corky Crafts, MD;  Location: [facility] INVASIVE CV LAB;  Service: Cardiovascular;  Laterality: N/A;   CORONARY/GRAFT ACUTE MI REVASCULARIZATION N/A 02/26/2020   Procedure: Coronary/Graft Acute MI Revascularization;  Surgeon: Corky Crafts, MD;  Location: [facility] INVASIVE CV LAB;  Service: Cardiovascular;  Laterality: N/A;   DIALYSIS/PERMA CATHETER INSERTION Right 04/26/2019   Perm Cath    DIALYSIS/PERMA CATHETER INSERTION N/A 04/26/2019   Procedure: DIALYSIS/PERMA CATHETER INSERTION;  Surgeon: Annice Needy, MD;  Location: [name] INVASIVE CV LAB;  Service: Cardiovascular;  Laterality: N/A;   DIALYSIS/PERMA CATHETER INSERTION N/A 05/25/2021   Procedure: DIALYSIS/PERMA CATHETER INSERTION;  Surgeon: Annice Needy, MD;  Location: [name] INVASIVE CV LAB;  Service:  Cardiovascular;  Laterality: N/A;   DIALYSIS/PERMA CATHETER INSERTION N/A 08/31/2021   Procedure: DIALYSIS/PERMA CATHETER INSERTION;  Surgeon: Annice Needy, MD;  Location: [name] INVASIVE CV LAB;  Service: Cardiovascular;  Laterality: N/A;   DIALYSIS/PERMA CATHETER REMOVAL N/A 08/15/2019   Procedure: DIALYSIS/PERMA CATHETER REMOVAL;  Surgeon: Renford Dills, MD;  Location: [name] INVASIVE CV LAB;  Service: Cardiovascular;  Laterality: N/A;   DIALYSIS/PERMA CATHETER REMOVAL N/A 03/15/2022   Procedure: DIALYSIS/PERMA CATHETER REMOVAL;  Surgeon: Annice Needy, MD;  Location: [name] INVASIVE CV LAB;  Service: Cardiovascular;  Laterality: N/A;   EMBOLIZATION (CATH LAB) Right 02/06/2021   Procedure: EMBOLIZATION;  Surgeon: Annice Needy, MD;  Location: [name] INVASIVE CV LAB;  Service: Cardiovascular;  Laterality: Right;  Right Upper Extremity Dialysis Access, Permcath Placement   ESOPHAGOGASTRODUODENOSCOPY (EGD) WITH PROPOFOL N/A 12/27/2017   Procedure: ESOPHAGOGASTRODUODENOSCOPY (EGD) WITH PROPOFOL;  Surgeon: Toledo, Boykin Nearing, MD;  Location: [name] ENDOSCOPY;  Service: Gastroenterology;  Laterality: N/A;   EYE SURGERY     LEFT HEART CATH AND CORONARY ANGIOGRAPHY N/A 02/26/2020   Procedure: LEFT HEART CATH AND CORONARY ANGIOGRAPHY;  Surgeon: Corky Crafts, MD;  Location: [facility] INVASIVE CV LAB;  Service: Cardiovascular;  Laterality: N/A;   PROSTATE SURGERY  2016   REVISON OF ARTERIOVENOUS FISTULA Right 07/22/2022   Procedure: RESECTION OF ANEURYSMAL RIGHT ARM ARTERIOVENOUS FISTULA;  Surgeon: Annice Needy, MD;  Location: [name] ORS;  Service: Vascular;  Laterality: Right;   TONSILECTOMY/ADENOIDECTOMY WITH MYRINGOTOMY     TONSILLECTOMY     UPPER EXTREMITY ANGIOGRAPHY Right 11/26/2020   Procedure: Upper Extremity Angiography;  Surgeon: Annice Needy, MD;  Location: [name] INVASIVE CV LAB;  Service: Cardiovascular;  Laterality: Right;   UPPER EXTREMITY ANGIOGRAPHY Right 02/05/2021   Procedure: UPPER EXTREMITY  ANGIOGRAPHY;  Surgeon: Annice Needy, MD;  Location: [name] INVASIVE CV LAB;  Service: Cardiovascular;  Laterality: Right;   HPI:  [name]. is a 52 y.o. male who presents with type 2 diabetes with end-stage renal disease on dialysis with extensive gangrenous changes to the right foot s/p R BKA on 02/09/23    Assessment / Plan / Recommendation  Clinical Impression   Pt presents with a functional oropharyngeal swallow per clinical swallow assessment completed today. Oral prep and transit prompt with complete oral clearance. Pharyngeal swallow initiation appeared timely with laryngeal elevation noted. Pt able to take multiple pills whole without liquid. No overt or subtle s/s of aspiration noted across consistencies.   Recommend continue current diet at tolerated and PO meds as tolerated. No acute SLP needs identified at this time. SLP will sign off. Please re-consult if pt exhibits concerns for aspiration with PO intake.   SLP Visit Diagnosis:  (r/o dysphagia)    Aspiration  Risk  No limitations    Diet Recommendation Regular;Thin liquid    Liquid Administration via: Cup;Straw Medication Administration: Whole meds with liquid Supervision: Patient able to self feed Compensations: Small sips/bites;Slow rate Postural Changes: Other (Comment) (sitting upright at least at 45 degrees)    Other  Recommendations Oral Care Recommendations: Oral care BID    Recommendations for follow up therapy are one component of a multi-disciplinary discharge planning process, led by the attending physician.  Recommendations may be updated based on patient status, additional functional criteria and insurance authorization.  Follow up Recommendations No SLP follow up      Assistance Recommended at Discharge  Defer to PT/OT  Functional Status Assessment Patient has not had a recent decline in their functional status  Frequency and Duration   D/c acute SLP         Prognosis N/a oropharyngeal  swallow function WNL     Swallow Study   General Date of Onset: 02/03/23 HPI: [name]. is a 52 y.o. male who presents with type 2 diabetes with end-stage renal disease on dialysis with extensive gangrenous changes to the right foot s/p R BKA on 02/09/23 Type of Study: Bedside Swallow Evaluation Previous Swallow Assessment: 06/24/21: WNL Diet Prior to this Study: Regular;Thin liquids (Level 0) Temperature Spikes Noted: No Respiratory Status: Room air History of Recent Intubation: No Behavior/Cognition: Alert;Cooperative Oral Cavity Assessment: Within Functional Limits Oral Cavity - Dentition: Missing dentition;Poor condition Vision: Functional for self-feeding Self-Feeding Abilities: Able to feed self Patient Positioning: Upright in bed Baseline Vocal Quality: Normal Volitional Swallow: Able to elicit    Oral/Motor/Sensory Function Overall Oral Motor/Sensory Function: Within functional limits   Ice Chips Ice chips: Not tested   Thin Liquid Thin Liquid: Within functional limits Presentation: Cup;Self Fed    Nectar Thick Nectar Thick Liquid: Not tested   Honey Thick Honey Thick Liquid: Not tested   Puree Puree: Within functional limits Presentation: Spoon   Solid     Solid: Within functional limits Presentation: Self Fed      Ellery Plunk 02/10/2023,10:23 AM

## 2023-02-10 NOTE — Plan of Care (Signed)
  Problem: Education: Goal: Knowledge of General Education information will improve Description: Including pain rating scale, medication(s)/side effects and non-pharmacologic comfort measures Outcome: Progressing   Problem: Health Behavior/Discharge Planning: Goal: Ability to manage health-related needs will improve Outcome: Progressing   Problem: Clinical Measurements: Goal: Ability to maintain clinical measurements within normal limits will improve Outcome: Progressing Goal: Will remain free from infection Outcome: Progressing Goal: Diagnostic test results will improve Outcome: Progressing Goal: Respiratory complications will improve Outcome: Progressing Goal: Cardiovascular complication will be avoided Outcome: Progressing   Problem: Activity: Goal: Risk for activity intolerance will decrease Outcome: Progressing   Problem: Nutrition: Goal: Adequate nutrition will be maintained Outcome: Progressing   Problem: Coping: Goal: Level of anxiety will decrease Outcome: Progressing   Problem: Elimination: Goal: Will not experience complications related to bowel motility Outcome: Progressing Goal: Will not experience complications related to urinary retention Outcome: Progressing   Problem: Pain Management: Goal: General experience of comfort will improve Outcome: Progressing   Problem: Safety: Goal: Ability to remain free from injury will improve Outcome: Progressing   Problem: Skin Integrity: Goal: Risk for impaired skin integrity will decrease Outcome: Progressing   Problem: Education: Goal: Understanding of CV disease, CV risk reduction, and recovery process will improve Outcome: Progressing Goal: Individualized Educational Video(s) Outcome: Progressing   Problem: Activity: Goal: Ability to return to baseline activity level will improve Outcome: Progressing   Problem: Cardiovascular: Goal: Ability to achieve and maintain adequate cardiovascular perfusion  will improve Outcome: Progressing Goal: Vascular access site(s) Level 0-1 will be maintained Outcome: Progressing   Problem: Health Behavior/Discharge Planning: Goal: Ability to safely manage health-related needs after discharge will improve Outcome: Progressing   Problem: Education: Goal: Knowledge of the prescribed therapeutic regimen will improve Outcome: Progressing Goal: Ability to verbalize activity precautions or restrictions will improve Outcome: Progressing Goal: Understanding of discharge needs will improve Outcome: Progressing   Problem: Activity: Goal: Ability to perform//tolerate increased activity and mobilize with assistive devices will improve Outcome: Progressing   Problem: Clinical Measurements: Goal: Postoperative complications will be avoided or minimized Outcome: Progressing   Problem: Self-Care: Goal: Ability to meet self-care needs will improve Outcome: Progressing   Problem: Self-Concept: Goal: Ability to maintain and perform role responsibilities to the fullest extent possible will improve Outcome: Progressing   Problem: Pain Management: Goal: Pain level will decrease with appropriate interventions Outcome: Progressing   Problem: Skin Integrity: Goal: Demonstration of wound healing without infection will improve Outcome: Progressing

## 2023-02-10 NOTE — Evaluation (Signed)
Physical Therapy Evaluation Patient Details Name: [name]. MRN: [id number] DOB: June 17, 1970 Today's Date: 02/10/2023  History of Present Illness  Pt is a 52 yr old male who presented 02/03/23 due to necrotic gangrenous R foot and ischemic changes to L heel.  Pt s/p 02/04/23 partial fourth ray amputation and resection of 5th metatarsal bone. 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 PMH ESRD  HTN, amputation L big toe, L eye absent, CVA, afib, Glaucoma, seizure,  PVD, CHF, CAD, chronic diarrhea,STEMI, osteomyelitis   Clinical Impression  Pt presents with condition above and deficits mentioned below, see PT Problem List. PTA, he was mod I utilizing a rollator for functional mobility. He lives with his wife in a 1-level house with a ramped entrance. Currently, pt is limited by pain and his BP dropping, see below. The pt can be self limiting in the amount of initiation he provides with his arms, trunk, and L lower extremity to assist with functional mobility. At this time, he is requiring minA to roll but total assist x2 to transition supine <> sit EOB and max-total assist for static sitting balance due to pt having a posterior lean. He displays deficits in strength bil, balance, and activity tolerance and is at high risk for falls. The pt could benefit from short-term inpatient rehab, < 3 hours/day, prior to d/c'ing home. Will continue to follow acutely.   BP -  107/62 supine 76/63 sitting EOB - pt symptomatic 103/65 supine end of session        If plan is discharge home, recommend the following: Two people to help with walking and/or transfers;Two people to help with bathing/dressing/bathroom;Assistance with cooking/housework;Assist for transportation;Help with stairs or ramp for entrance   Can travel by private vehicle   No    Equipment Recommendations Wheelchair (measurements PT);Wheelchair cushion (measurements PT);Hospital bed;Hoyer lift;Other (comment) (sliding board)   Recommendations for Other Services       Functional Status Assessment Patient has had a recent decline in their functional status and demonstrates the ability to make significant improvements in function in a reasonable and predictable amount of time.     Precautions / Restrictions Precautions Precautions: Fall Precaution Comments: R BKA 12/18 with limb protector; watch BP (hx of syncope); wound vac R leg Restrictions Weight Bearing Restrictions Per Provider Order: Yes RLE Weight Bearing Per Provider Order: Non weight bearing Other Position/Activity Restrictions: limb protector      Mobility  Bed Mobility Overal bed mobility: Needs Assistance Bed Mobility: Rolling, Supine to Sit, Sit to Supine Rolling: Min assist, Used rails   Supine to sit: Total assist, HOB elevated, +2 for physical assistance, +2 for safety/equipment Sit to supine: Total assist, +2 for physical assistance, +2 for safety/equipment, HOB elevated   General bed mobility comments: Pt able to roll to R pulling on bed rail with minA at trunk to rotate to place bed pan. Pt did not initiate and assist well with transition supine <> sit EOB, total assist x2 needed. Provided step-by-step cues and assistance to slide each leg proximal to L EOB, reach R hand towards L rail, and slide hips towards L EOB using bed pad to sit up. Pt immediately extended his trunk upon sitting up, needing assist at trunk and blocking at knees to prevent pt from sliding off EOB. Cues also provided to lean to L elbow to descend trunk when returning to supine.    Transfers  General transfer comment: deferred this date due to drop in BP and 10/10 pain    Ambulation/Gait               General Gait Details: unable at this time  Stairs            Wheelchair Mobility     Tilt Bed    Modified Rankin (Stroke Patients Only)       Balance Overall balance assessment: Needs assistance Sitting-balance  support: Bilateral upper extremity supported, Feet supported Sitting balance-Leahy Scale: Poor Sitting balance - Comments: Max-total assist needed to support his trunk due to pt leaning posteriorly. Assist needed to flex L knee to place L foot on ground to also assist in preventing pt from sliding off EOB. Postural control: Posterior lean     Standing balance comment: deferred this date due to drop in BP and 10/10 pain                             Pertinent Vitals/Pain Pain Assessment Pain Assessment: 0-10 Pain Score: 10-Worst pain ever Pain Location: R leg, groin Pain Descriptors / Indicators: Discomfort, Grimacing, Guarding, Operative site guarding Pain Intervention(s): Limited activity within patient's tolerance, Monitored during session, Repositioned, Premedicated before session    Home Living Family/patient expects to be discharged to:: Private residence Living Arrangements: Spouse/significant other Available Help at Discharge: Family;Available PRN/intermittently Type of Home: House Home Access: Ramped entrance       Home Layout: One level Home Equipment: Rollator (4 wheels);Rolling Walker (2 wheels);BSC/3in1      Prior Function Prior Level of Function : Needs assist             Mobility Comments: walks with rollator ADLs Comments: wife began to assist with dressing and bathing recently due to pt having more difficulty with hygeine recently     Extremity/Trunk Assessment   Upper Extremity Assessment Upper Extremity Assessment: Defer to OT evaluation    Lower Extremity Assessment Lower Extremity Assessment: RLE deficits/detail;LLE deficits/detail RLE Deficits / Details: BKA 12/18, reports decreased sensation distally, gross weakness noted with poor initiation by pt to move and lift leg off bed (often needed assistance to do so) RLE Sensation: decreased light touch LLE Deficits / Details: hx of CVA affecting L side, gross weakness with MMT scores </=  4-, noted discoloration plantar aspects of toes but no wounds at foot, reports sensation is intact    Cervical / Trunk Assessment Cervical / Trunk Assessment: Normal  Communication   Communication Communication: No apparent difficulties  Cognition Arousal: Alert Behavior During Therapy: Flat affect Overall Cognitive Status: Difficult to assess                                 General Comments: Pt with flat affect throughout and initially resistive to mobility attempts but agreeable with encouragement. Needed encouragement to use his arms to assist with mobility. Needed cues to sequence all mobility. Unsure if poor initiation and active participation is personality. Unsure if baseline or not, may benefit from further assessment.        General Comments General comments (skin integrity, edema, etc.): BP - 107/62 supine, 76/63 sitting EOB, 103/65 supine end of session - pt symptomatic; educated pt and wife on pressure relief schedule and on mobilizing legs and using bed features to sit up as able and as BP remains stable  Exercises     Assessment/Plan    PT Assessment Patient needs continued PT services  PT Problem List Decreased strength;Decreased range of motion;Decreased activity tolerance;Decreased balance;Decreased mobility;Decreased knowledge of use of DME;Cardiopulmonary status limiting activity;Impaired sensation;Decreased skin integrity;Pain       PT Treatment Interventions DME instruction;Gait training;Therapeutic activities;Functional mobility training;Therapeutic exercise;Balance training;Neuromuscular re-education;Patient/family education;Wheelchair mobility training    PT Goals (Current goals can be found in the Care Plan section)  Acute Rehab PT Goals Patient Stated Goal: to reduce pain PT Goal Formulation: With patient/family Time For Goal Achievement: 02/24/23 Potential to Achieve Goals: Fair    Frequency Min 1X/week     Co-evaluation PT/OT/SLP  Co-Evaluation/Treatment: Yes Reason for Co-Treatment: For patient/therapist safety;To address functional/ADL transfers PT goals addressed during session: Mobility/safety with mobility;Balance         AM-PAC PT "6 Clicks" Mobility  Outcome Measure Help needed turning from your back to your side while in a flat bed without using bedrails?: A Little Help needed moving from lying on your back to sitting on the side of a flat bed without using bedrails?: Total Help needed moving to and from a bed to a chair (including a wheelchair)?: Total Help needed standing up from a chair using your arms (e.g., wheelchair or bedside chair)?: Total Help needed to walk in hospital room?: Total Help needed climbing 3-5 steps with a railing? : Total 6 Click Score: 8    End of Session   Activity Tolerance: Patient limited by pain;Other (comment) (limited by drop in BP) Patient left: in bed;with call bell/phone within reach;with bed alarm set;with family/visitor present Nurse Communication: Mobility status;Other (comment) (BP) PT Visit Diagnosis: Muscle weakness (generalized) (M62.81);Difficulty in walking, not elsewhere classified (R26.2);Pain Pain - Right/Left: Right Pain - part of body: Leg    Time: 2831-5176 PT Time Calculation (min) (ACUTE ONLY): 33 min   Charges:   PT Evaluation $PT Eval Moderate Complexity: 1 Mod   PT General Charges $$ ACUTE PT VISIT: 1 Visit         Virgil Benedict, PT, DPT Acute Rehabilitation Services  Office: [phone]   Bettina Gavia 02/10/2023, 12:01 PM

## 2023-02-10 NOTE — Progress Notes (Signed)
PHARMACIST LIPID MONITORING  [name]. is a 52 y.o. male admitted on 02/03/2023 with type 2 diabetes with end-stage renal disease on dialysis with extensive gangrenous changes to the right foot with mild ischemic changes to the left heel. Patient is s/p R BKA. Patient also has history of cardiac arrest, cerebral hemorrhage, DDD, gastroparesis, HLD, HTN, NSTEMI, seizure, CHF, and afib. Pharmacy has been consulted to optimize lipid-lowering therapy with the indication of secondary prevention for clinical ASCVD.  Recent Labs:  Lipid Panel (last 6 months):   Lab Results  Component Value Date   CHOL 60 02/09/2023   TRIG 115 02/09/2023   HDL 14 (L) 02/09/2023   CHOLHDL 4.3 02/09/2023   VLDL 23 02/09/2023   LDLCALC 23 02/09/2023    Hepatic function panel (last 6 months):   Lab Results  Component Value Date   AST 10 (L) 02/10/2023   ALT 7 02/10/2023   ALKPHOS 67 02/10/2023   BILITOT 0.5 02/10/2023    SCr (since admission):   Serum creatinine: 6.73 mg/dL (H) 85/27/78 2423 Estimated creatinine clearance: 14.2 mL/min (A)  The ASCVD Risk score (Arnett DK, et al., 2019) failed to calculate for the following reasons:   Risk score cannot be calculated because patient has a medical history suggesting prior/existing ASCVD   Current therapy and lipid therapy tolerance Current lipid-lowering therapy: atorvastatin 80 mg daily Previous lipid-lowering therapies (if applicable): rosuvastatin (unsure why it was discontinued) Documented or reported allergies or intolerances to lipid-lowering therapies (if applicable): none  Assessment:   Patient's lipid panel was obtained on 02/09/2023 and LDL was 23 (in goal). Patient's LDL goal would be <55 based on prior ASCVD history and current risk factors. Patient is currently on high-intensity statin (atorvastatin 80 mg). No changes in hyperlipidemia therapy are indicated at this time.   Plan:    1.Statin intensity (high intensity recommended for  all patients regardless of the LDL):  No statin changes. The patient is already on a high intensity statin.  2.Add ezetimibe (if any one of the following):   Not indicated at this time.  3.Refer to lipid clinic:   No  4.Follow-up with:  Primary care provider - [name], FNP  5.Follow-up labs after discharge:  No changes in lipid therapy, repeat a lipid panel in one year.     Roslyn Smiling, PharmD PGY1 Pharmacy Resident 02/10/2023 10:40 AM

## 2023-02-10 NOTE — Progress Notes (Signed)
PROGRESS NOTE    [name].  QMV:[id number] DOB: 02-09-1971 DOA: 02/03/2023 PCP: [name], FNP  Subjective: Pt seen and examined.  Pain well controlled. Family has decided on SNF placement instead of CIR Will make arrangements with CM. Medically stable from my point of view.   Hospital Course: HPI: [name]. is a 52 y.o. male with medical history significant of type 2 diabetes mellitus, acute osteomyelitis of toe of right foot with gangrene status post amputation of the great toe on the right foot, paroxysmal atrial fibrillation not on anticoagulation, hypertension, ESRD on HD, seizure disorder, BPH, diabetic polyneuropathy, GERD, gastroparesis, hypertension was sent to ED from podiatry office.  Per patient and his wife who is at the bedside, patient's wife noted a wound on his right foot on Sunday.  He called his podiatrist/Dr. Wagoner and was given an appointment to see today.  Being seen in the podiatry office today, he was noted to have significant pus and infected wound, he was directed to the emergency department for further workup and admission.  Patient denies any complaint, no pain.  Per wife, patient typically does not notice things himself until she does.  No reports of fever, chills, sweating, nausea, vomiting any other complaint or any foot trauma, any recent travel or sick contact.   ED Course: Hemodynamically stable upon arrival.  X-ray right foot unremarkable.  MRI ordered and pending.  Patient received cefepime, Flagyl and vancomycin in the ED and nephrology was consulted for dialysis and hospitalist were consulted for admission.  Significant Events: Admitted 02/03/2023 for diabetic right foot ulcer with concern for acute osteomyelitis   Significant Labs: Admission WBC 22.3, Bicarb 13, BUN 122, Scr 18.57  Significant Imaging Studies: MRI Right foot Substantial fluid/edema tracking within along the quadratus plantae, abductor hallucis, flexor  digitorum brevis, third and fourth plantar and dorsal interosseous, and abductor digiti minimi muscles, confluent with subcutaneous edema which is primarily infiltrative but which may have small fluid pockets tracking dorsal lateral to the fifth metatarsal, dorsal to the fourth metatarsal and into the fourth toe, and in the plantar subcutaneous tissues below the distal fourth and fifth metatarsals. Appearance concerning for active infection. 2. Cortical irregularity and mild marrow edema in the head of the fifth metatarsal suspicious for early osteomyelitis. 3. Previous small toe amputation at the MTP joint.  Antibiotic Therapy: Anti-infectives (From admission, onward)    Start     Dose/Rate Route Frequency Ordered Stop   02/08/23 1800  vancomycin (VANCOREADY) IVPB 750 mg/150 mL  Status:  Discontinued        750 mg 150 mL/hr over 60 Minutes Intravenous Every T-Th-Sa (Hemodialysis) 02/04/23 2042 02/07/23 1436   02/07/23 1530  vancomycin (VANCOREADY) IVPB 750 mg/150 mL        750 mg 150 mL/hr over 60 Minutes Intravenous Every M-W-F (Hemodialysis) 02/07/23 1436     02/05/23 0100  vancomycin (VANCOREADY) IVPB 750 mg/150 mL        750 mg 150 mL/hr over 60 Minutes Intravenous  Once 02/04/23 2042 02/05/23 0509   02/04/23 1800  ceFEPIme (MAXIPIME) 1 g in sodium chloride 0.9 % 100 mL IVPB        1 g 200 mL/hr over 30 Minutes Intravenous Every 24 hours 02/03/23 1401     12 /12/24 1330  vancomycin (VANCOCIN) IVPB 1000 mg/200 mL premix       Placed in "Followed by" Linked Group   1,000 mg 200 mL/hr over 60 Minutes Intravenous  Once  02/03/23 1224 02/03/23 1513   02/03/23 1230  metroNIDAZOLE (FLAGYL) IVPB 500 mg        500 mg 100 mL/hr over 60 Minutes Intravenous  Once 02/03/23 1221 02/03/23 1402   02/03/23 1230  ceFEPIme (MAXIPIME) 2 g in sodium chloride 0.9 % 100 mL IVPB        2 g 200 mL/hr over 30 Minutes Intravenous  Once 02/03/23 1224 02/03/23 1352   02/03/23 1230  vancomycin (VANCOCIN) IVPB 1000  mg/200 mL premix       Placed in "Followed by" Linked Group   1,000 mg 200 mL/hr over 60 Minutes Intravenous  Once 02/03/23 1224 02/03/23 1412       Procedures:   Consultants: Nephrology Vascular surgery orthopedics    Assessment and Plan: * Gangrene of right foot (HCC) On admission to 02-06-2023. Pt has osteomyelitis of right foot. Pt taken to OR on 02-04-2023 by podiatry. Pt had Partial fourth ray amputation, right foot.  Further resection of fifth metatarsal bone, right foot. [name] with orthopedics saw the patient and felt that pt had gangrene of the foot.  02-07-2023 pt will need right BKA by ortho. Planned on Wednesday Feb 09, 2023. 02-08-2023 angiogram today to see if he will need left lower extremity surgery. 02-10-2023 s/p right BKA on 02-09-2023. Gangrene has been surgically cured.  S/P BKA (below knee amputation), right [facility]) - 02-09-2023 02-09-2023 s/p right BKA. Management per ortho.  02-10-2023 family has decided on SNF at discharge. CM aware.  PAD (peripheral artery disease) (HCC) 02-07-2023 pt noted to hypoglycemic today. Angiogram canceled.  Angiogram to make sure that he doesn't need left BKA at the same time. 02-08-2023 angiogram today to see if he will need left lower extremity surgery.   02-09-2023 angiogram on 02-08-2023 shows blood flow to left foot. "Inline flow to the foot with three-vessel runoff bilaterally but with microvascular disease and minimal filling pedal vessels bilaterally."  02-10-2023 remains on lipitor. Will need as least ASA 81 mg daily at discharge  History of CVA (cerebrovascular accident) Chronic.  Coronary artery disease 02-07-2023 stable.  ESRD on hemodialysis (HCC) 02-07-2023 thru 02-19-2023 continue with HD per nephrology 02-10-2023 on HD  Anemia due to chronic kidney disease, on chronic dialysis (HCC) 02-07-2023 stable. 02-10-2023  Hgb 8.6  Paroxysmal atrial fibrillation (HCC) 02-07-2023 continue with amiodarone  200 mg qday 02-08-2023 off systemic anticoagulation in preparation BKA  02-09-2023 will need to ask ortho when systemic anticoagulation can be resumed after his right BKA. 02-10-2023 have asked ortho when eliquis can be restarted  Uncontrolled type 2 diabetes mellitus with hyperglycemia, with long-term current use of insulin (HCC) 02-07-2023 CBG low today. Pt confused. Will give 1/2 amp D50 IV. Hypoglycemia may be due to pt being NPO for his angiogram today. 02-08-2023 diabetic hypoglycemia improved with IVF with dextrose. 02-09-2023 continue D5 LR @ 25 ml/hr until po intake back to normal. 02-10-2023 pt advised he need to continue to eat 3 times a day, even if it is just a snack.  Essential hypertension, benign 02-07-2023 continue coreg 12.5 mg bid. 02-08-2023 stable. Continue coreg 02-09-2023 stable. 02-10-2023 wife states pt has a cough from HTN meds. Pt on coreg for BP and afib. Changing to toprol-xl will also likely give him cough. Wife agreeable to staying on coreg.   DVT prophylaxis: SCD's Start: 02/09/23 1743 Place and maintain sequential compression device Start: 02/05/23 1515     Code Status: Full Code Family Communication: discussed with wife and pt at bedside.  They have decided on SNF for discharge. CM working on plan Disposition Plan: SNF Reason for continuing need for hospitalization: medically stable for DC.  Objective: Vitals:   02/10/23 0350 02/10/23 0431 02/10/23 0738 02/10/23 1109  BP: 134/70  134/70 107/62  Pulse: (!) 52 (!) 57 (!) 39 62  Resp: 15 10 17 20   Temp: 98.5 F (36.9 C)  98.3 F (36.8 C) 98.8 F (37.1 C)  TempSrc: Oral  Oral Oral  SpO2: 98% 94% 98% 98%  Weight:  80 kg    Height:        Intake/Output Summary (Last 24 hours) at 02/10/2023 1533 Last data filed at 02/10/2023 0354 Gross per 24 hour  Intake 765.74 ml  Output 20 ml  Net 745.74 ml   Filed Weights   02/09/23 1309 02/09/23 1334 02/10/23 0431  Weight: 80.5 kg 80.5 kg 80 kg     Examination:  Physical Exam Vitals and nursing note reviewed.  Constitutional:      General: He is not in acute distress.    Appearance: He is not toxic-appearing.  HENT:     Head: Normocephalic and atraumatic.     Nose: Nose normal.  Eyes:     Comments: Missing left eye  Cardiovascular:     Pulses: Normal pulses.     Heart sounds: Normal heart sounds.  Pulmonary:     Effort: Pulmonary effort is normal.  Abdominal:     General: Abdomen is flat. There is no distension.     Tenderness: There is no abdominal tenderness.  Musculoskeletal:     Comments: Right BKA  Skin:    General: Skin is warm and dry.  Neurological:     Mental Status: He is alert.     Data Reviewed: I have personally reviewed following labs and imaging studies  CBC: Recent Labs  Lab 02/04/23 0331 02/05/23 0620 02/07/23 0318 02/09/23 0323 02/09/23 1420 02/10/23 0310  WBC 22.5* 22.6* 17.9* 16.1*  --  12.8*  NEUTROABS  --   --   --   --   --  11.4*  HGB 8.5* 8.5* 8.1* 7.9* 9.9* 8.6*  HCT 26.4* 25.3* 25.4* 25.5* 29.0* 28.8*  MCV 69.8* 69.1* 70.8* 72.6*  --  74.8*  PLT 206 209 224 219  --  194   Basic Metabolic Panel: Recent Labs  Lab 02/04/23 0331 02/05/23 0620 02/07/23 0318 02/09/23 0323 02/09/23 1420 02/10/23 0310  NA 138 139 135 136 134* 134*  K 4.6 3.2* 4.0 4.1 4.0 3.7  CL 104 101 101 97* 97* 98  CO2 15* 22 19* 24  --  25  GLUCOSE 99 90 103* 98 78 149*  BUN 132* 65* 105* 72* 38* 45*  CREATININE 18.70* 10.81* 14.30* 9.40* 5.90* 6.73*  CALCIUM 8.8* 8.9 8.7* 8.7*  --  8.4*  MG  --  1.8  --   --   --   --   PHOS  --  5.5*  --   --   --   --    GFR: Estimated Creatinine Clearance: 14.2 mL/min (A) (by C-G formula based on SCr of 6.73 mg/dL (H)). Liver Function Tests: Recent Labs  Lab 02/10/23 0310  AST 10*  ALT 7  ALKPHOS 67  BILITOT 0.5  PROT 6.3*  ALBUMIN 2.0*   CBG: Recent Labs  Lab 02/08/23 1406 02/09/23 1641 02/10/23 0353 02/10/23 0740 02/10/23 1110  GLUCAP 89  80 142* 103* 197*   Lipid Profile: Recent Labs    02/09/23  0323  CHOL 60  HDL 14*  LDLCALC 23  TRIG 027  CHOLHDL 4.3   Recent Results (from the past 240 hours)  Culture, blood (routine x 2)     Status: None   Collection Time: 02/03/23 12:22 PM   Specimen: BLOOD RIGHT ARM  Result Value Ref Range Status   Specimen Description BLOOD RIGHT ARM  Final   Special Requests   Final    BOTTLES DRAWN AEROBIC AND ANAEROBIC Blood Culture results may not be optimal due to an inadequate volume of blood received in culture bottles   Culture   Final    NO GROWTH 5 DAYS Performed at [facility], [address], [place], [place] [address]    Report Status 02/08/2023 FINAL  Final  Culture, blood (routine x 2)     Status: None   Collection Time: 02/03/23 12:27 PM   Specimen: BLOOD RIGHT HAND  Result Value Ref Range Status   Specimen Description BLOOD RIGHT HAND  Final   Special Requests   Final    BOTTLES DRAWN AEROBIC AND ANAEROBIC Blood Culture results may not be optimal due to an inadequate volume of blood received in culture bottles   Culture   Final    NO GROWTH 5 DAYS Performed at [facility], [address] [place], [place] [address]    Report Status 02/08/2023 FINAL  Final  Aerobic/Anaerobic Culture w Gram Stain (surgical/deep wound)     Status: None   Collection Time: 02/04/23 12:06 PM   Specimen: Foot, Right; Tissue  Result Value Ref Range Status   Specimen Description TISSUE  Final   Special Requests RIGHT FOOT  Final   Gram Stain   Final    FEW WBC PRESENT, PREDOMINANTLY PMN ABUNDANT GRAM POSITIVE COCCI IN PAIRS RARE GRAM NEGATIVE COCCOBACILLI    Culture   Final    ABUNDANT STAPHYLOCOCCUS AUREUS MODERATE CORYNEBACTERIUM STRIATUM Standardized susceptibility testing for this organism is not available. NO ANAEROBES ISOLATED Performed at [facility], [address], [place], [place] [address]    Report Status 02/09/2023 FINAL  Final   Organism ID,  Bacteria STAPHYLOCOCCUS AUREUS  Final      Susceptibility   Staphylococcus aureus - MIC*    CIPROFLOXACIN <=0.5 SENSITIVE Sensitive     ERYTHROMYCIN RESISTANT Resistant     GENTAMICIN <=0.5 SENSITIVE Sensitive     OXACILLIN <=0.25 SENSITIVE Sensitive     TETRACYCLINE <=1 SENSITIVE Sensitive     VANCOMYCIN 1 SENSITIVE Sensitive     TRIMETH/SULFA <=10 SENSITIVE Sensitive     CLINDAMYCIN RESISTANT Resistant     RIFAMPIN <=0.5 SENSITIVE Sensitive     Inducible Clindamycin POSITIVE Resistant     LINEZOLID 2 SENSITIVE Sensitive     * ABUNDANT STAPHYLOCOCCUS AUREUS  Aerobic/Anaerobic Culture w Gram Stain (surgical/deep wound)     Status: None   Collection Time: 02/04/23 12:06 PM   Specimen: Foot, Right; Wound  Result Value Ref Range Status   Specimen Description WOUND  Final   Special Requests RIGHT FOOT  Final   Gram Stain   Final    MODERATE WBC PRESENT, PREDOMINANTLY PMN MODERATE GRAM POSITIVE COCCI    Culture   Final    ABUNDANT STAPHYLOCOCCUS AUREUS SUSCEPTIBILITIES PERFORMED ON PREVIOUS CULTURE WITHIN THE LAST 5 DAYS. MODERATE CORYNEBACTERIUM STRIATUM Standardized susceptibility testing for this organism is not available. NO ANAEROBES ISOLATED Performed at [facility], [address], [place], [place] [address]  Report Status 02/09/2023 FINAL  Final  Surgical pcr screen     Status: Abnormal   Collection Time: 02/08/23  8:05 PM   Specimen: Nasal Mucosa; Nasal Swab  Result Value Ref Range Status   MRSA, PCR NEGATIVE NEGATIVE Final   Staphylococcus aureus POSITIVE (A) NEGATIVE Final    Comment: (NOTE) The Xpert SA Assay (FDA approved for NASAL specimens in patients 49 years of age and older), is one component of a comprehensive surveillance program. It is not intended to diagnose infection nor to guide or monitor treatment. Performed at [facility], [address], [place], [place] [address]      Radiology Studies: No results found.  Scheduled Meds:   amiodarone  200 mg Oral Daily   amphetamine-dextroamphetamine  5 mg Oral Daily   vitamin C  1,000 mg Oral Daily   atorvastatin  80 mg Oral QHS   calcium acetate  1,334 mg Oral TID WC   carvedilol  12.5 mg Oral BID   Chlorhexidine Gluconate Cloth  6 each Topical Q0600   Chlorhexidine Gluconate Cloth  6 each Topical Q0600   dorzolamide-timolol  1 drop Right Eye BID   mupirocin ointment  1 Application Nasal BID   nutrition supplement (JUVEN)  1 packet Oral BID BM   nystatin   Topical TID   sodium chloride flush  3 mL Intravenous Q12H   sodium chloride flush  3 mL Intravenous Q12H   sodium chloride flush  3-10 mL Intravenous Q12H   zinc sulfate (50mg  elemental zinc)  220 mg Oral Daily   Continuous Infusions:  levETIRAcetam 250 mg (02/09/23 1232)   levETIRAcetam 500 mg (02/10/23 1024)   magnesium sulfate bolus IVPB     vancomycin 750 mg (02/07/23 1620)     LOS: 7 days   Time spent: 40 minutes  Carollee Herter, DO  [facility]  02/10/2023, 3:33 PM

## 2023-02-10 NOTE — Progress Notes (Signed)
Orthopedic Tech Progress Note Patient Details:  [name] 1970-10-30 [id number] Called in order to [facility] for vive BK Patient ID: [name], male   DOB: [birth year], 52 y.o.   MRN: [id number]  [name] 02/10/2023, 8:52 AM

## 2023-02-10 NOTE — Consult Note (Signed)
Physical Medicine and Rehabilitation Consult Reason for Consult:impaired functional mobility Referring Physician: [name]   HPI: [name]. is a 52 y.o. male with a prior CVA with left hemiparesis, diabetes, PAD with prior toe amps and ESRD on HD who developed worsening ischemia and gangrene of his right foot. Pt underwent a right BKA on 02/09/23 by [name]. Pt with expected post op pain.  He continues on HD under supervision of nephrology. Therapy evals are pending. Pt lives at home with his daughter who works during the day. Daughter was having some increased safety concerns regarding her dad  prior to this admission.   Review of Systems  Unable to perform ROS: Mental acuity   Past Medical History:  Diagnosis Date   Acute Bil  CVAs of Bil Frontal amd Rt Parietal  in Watershed Distribution 04/21/2021   Patchy acute cortically-based infarcts within the bilateral high  frontal lobes and right parietal lobe (predominantly in a watershed  distribution).   Subcentimeter acute infarct within the callosal splenium on the  left.     Acute bilateral cerebral infarction in a watershed distribution [facility]) 04/21/2021   a.) MRI brain 04/21/2021 --> patchy ACUTE cortically-based infarcts within the bilateral high frontal lobes and right parietal lobe   Acute cerebral infarction (HCC) 02/13/2021   a.) MRI brain 02/13/2021: acute LEFT hippocampal infarct   Acute cerebral infarction (HCC) 04/21/2021   a.) MRI brain 04/21/2021: subcentimeter ACUTE infarct within the callosal splenium on the LEFT   Acute osteomyelitis of toe of right foot with gangrene (HCC) 02/26/2022   Anaphylactic reaction due to adverse effect of correct drug or medicament properly administered, initial encounter 05/08/2019   Anemia of chronic renal failure    Anxiety    Aortic dilatation (HCC) 11/25/2020   a.) TTE 11/25/2020: Ao root measured 38 mm. b.) TTE 04/22/2021: Ao root measured 44 mm; c. 11/2021 Echo: Ao root  40mm.   Atrial fibrillation (HCC)    a.) CHA2DS2-VASc = 6 (CHF, HTN, CVA x2, prior MI, T2DM). b.) rate/rhythm maintained on oral amiodarone + carvedilol; chronic OAC/AP therapy discontinued following ICH   BPH (benign prostatic hyperplasia)    Cardiac arrest (HCC) 07/26/2021   a.) in setting of hyperkalemia, NSTEMI, and seizure following missed HD; pulseless and apneic --> required 1 round of CPR prior to ROSC   Cellulitis    Cerebral hemorrhage (HCC) 11/21/2021   a.) CT head 11/21/2021 --> 1.4 x 1.5 x 3.1 cm ACUTE hemorrhage within the inferior aspect of the fourth ventricle, and extending inferiorly through the foramen of foramen of [place] --> occurred in setting of HTN emergency and DOAC/APT-->eliquis/plavix d/c'd.   Cerebral microvascular disease    Chronic diarrhea    Chronic heart failure with preserved ejection fraction (HFpEF) (HCC) 07/19/2017   a.) 06/2017 Echo: EF 75%; b.) 11/2020 Echo: EF 55-60%; c.) 04/2021 Echo: EF 55-60%; d.) 11/2021 Echo: EF 65-70%, GrII DD, nl RV fxn, sev dil LA, large circumferential pericardial eff w/o tamponade, triv MR, AoV sclerosis. Ao root 40mm; e.) TTE 02/28/2025: EF 60-65%, sev LVH, mod posterior LV effusion, AoV sclerosis   Chronic pain of both ankles    Coronary artery disease 02/26/2020   a. 02/2020 Ant STEMI/PCI: LM nl, LAD 41m (3.5x30 Resolute Onyx), RI nl, LCX min irregs, RCA min irregs. EF 65%.   DDD (degenerative disc disease), cervical    Diabetes mellitus, type II (HCC)    Diabetic neuropathy (HCC)    Esophagitis  ESRD (end stage renal disease) on dialysis [facility]    a.) [facility]; T-Th-Sat   Frequent falls    Gait instability    Gastritis    Gastroparesis    GERD (gastroesophageal reflux disease)    H/O enucleation of left eyeball    Heart palpitations    History of kidney stones    HLD (hyperlipidemia)    Hypertension    Insomnia    a.) uses melatonin PRN   Long term current use of amiodarone    Nausea and vomiting in adult     recurrent   NSTEMI (non-ST elevated myocardial infarction) (HCC) 07/26/2021   a.) hyperkalemic from missed HD; seizures; decompensated to cardiac arrest and required CPR x 1 round prior to ROSC.   Osteomyelitis (HCC)    Pericardial effusion    a. 11/2021 Echo: EF 65-70%, GrII DD, nl RV fxn, sev dil LA, large circumferential pericardial eff w/o tamponade, triv MR, AoV sclerosis. Ao root 40mm.   Seizure (HCC)    a.) last 07/26/2021 in setting of missed HD --> hyperkalemic at 6.5 --> pulseless/apneic and required CPR; discharged home on levetiracetam.   ST elevation myocardial infarction (STEMI) of anterior wall (HCC) 02/26/2020   a.) LHC/PCI 02/26/2020 --> EF 65%; LVEDP 11 mmHg; 90% mLAD (3.5 x 20 mm Resolute Onyx DES x 1)   Past Surgical History:  Procedure Laterality Date   A/V FISTULAGRAM Left 08/31/2021   Procedure: A/V Fistulagram;  Surgeon: Annice Needy, MD;  Location: [name] INVASIVE CV LAB;  Service: Cardiovascular;  Laterality: Left;   A/V FISTULAGRAM Left 01/25/2022   Procedure: A/V Fistulagram;  Surgeon: Annice Needy, MD;  Location: [name] INVASIVE CV LAB;  Service: Cardiovascular;  Laterality: Left;   A/V FISTULAGRAM Right 10/18/2022   Procedure: A/V Fistulagram;  Surgeon: Annice Needy, MD;  Location: [name] INVASIVE CV LAB;  Service: Cardiovascular;  Laterality: Right;   ABDOMINAL AORTOGRAM W/LOWER EXTREMITY Bilateral 02/08/2023   Procedure: ABDOMINAL AORTOGRAM W/LOWER EXTREMITY;  Surgeon: Daria Pastures, MD;  Location: [facility] INVASIVE CV LAB;  Service: Vascular;  Laterality: Bilateral;   AMPUTATION Left 03/16/2016   Procedure: AMPUTATION DIGIT LEFT HALLUX;  Surgeon: Vivi Barrack, DPM;  Location: [facility] OR;  Service: Podiatry;  Laterality: Left;  can start around 5    AMPUTATION Right 02/09/2021   Procedure: AMPUTATION DIGIT;  Surgeon: Marlyne Beards, MD;  Location: [facility] OR;  Service: Orthopedics;  Laterality: Right;   AMPUTATION Right 02/04/2023   Procedure: AMPUTATION 4TH AND 5TH  METATARSAL;  Surgeon: Pilar Plate, DPM;  Location: [facility] OR;  Service: Orthopedics/Podiatry;  Laterality: Right;  I&D, partial ray resection, abx beads   AMPUTATION Right 02/09/2023   Procedure: RIGHT BELOW KNEE AMPUTATION;  Surgeon: [name], MD;  Location: [facility] OR;  Service: Orthopedics;  Laterality: Right;   AMPUTATION TOE Right 02/28/2022   Procedure: AMPUTATION TOE;  Surgeon: Louann Sjogren, DPM;  Location: [name] ORS;  Service: Podiatry;  Laterality: Right;  right fifth toe   ARTHROSCOPIC REPAIR ACL Left    AV FISTULA PLACEMENT Right 05/31/2019   Procedure: Brachiocephalic AV fistula creation;  Surgeon: Annice Needy, MD;  Location: [name] ORS;  Service: Vascular;  Laterality: Right;   AV FISTULA PLACEMENT Left 08/13/2021   Procedure: ARTERIOVENOUS (AV) FISTULA CREATION (BRACHIALCEPHALIC);  Surgeon: Annice Needy, MD;  Location: [name] ORS;  Service: Vascular;  Laterality: Left;   COLONOSCOPY WITH PROPOFOL N/A 10/28/2015   Procedure: COLONOSCOPY WITH PROPOFOL;  Surgeon: Christena Deem, MD;  Location: [name] ENDOSCOPY;  Service: Endoscopy;  Laterality: N/A;   COLONOSCOPY WITH PROPOFOL N/A 10/29/2015   Procedure: COLONOSCOPY WITH PROPOFOL;  Surgeon: Christena Deem, MD;  Location: [facility] ENDOSCOPY;  Service: Endoscopy;  Laterality: N/A;   COLONOSCOPY WITH PROPOFOL N/A 01/27/2023   Procedure: COLONOSCOPY WITH PROPOFOL;  Surgeon: Franky Macho, MD;  Location: [facility] ENDO [facility];  Service: Endoscopy;  Laterality: N/A;  12:30pm, asa 3   CORONARY ULTRASOUND/IVUS N/A 02/26/2020   Procedure: Intravascular Ultrasound/IVUS;  Surgeon: Corky Crafts, MD;  Location: [facility] INVASIVE CV LAB;  Service: Cardiovascular;  Laterality: N/A;   CORONARY/GRAFT ACUTE MI REVASCULARIZATION N/A 02/26/2020   Procedure: Coronary/Graft Acute MI Revascularization;  Surgeon: Corky Crafts, MD;  Location: [facility] INVASIVE CV LAB;  Service: Cardiovascular;  Laterality: N/A;   DIALYSIS/PERMA CATHETER INSERTION Right  04/26/2019   Perm Cath    DIALYSIS/PERMA CATHETER INSERTION N/A 04/26/2019   Procedure: DIALYSIS/PERMA CATHETER INSERTION;  Surgeon: Annice Needy, MD;  Location: [name] INVASIVE CV LAB;  Service: Cardiovascular;  Laterality: N/A;   DIALYSIS/PERMA CATHETER INSERTION N/A 05/25/2021   Procedure: DIALYSIS/PERMA CATHETER INSERTION;  Surgeon: Annice Needy, MD;  Location: [name] INVASIVE CV LAB;  Service: Cardiovascular;  Laterality: N/A;   DIALYSIS/PERMA CATHETER INSERTION N/A 08/31/2021   Procedure: DIALYSIS/PERMA CATHETER INSERTION;  Surgeon: Annice Needy, MD;  Location: [name] INVASIVE CV LAB;  Service: Cardiovascular;  Laterality: N/A;   DIALYSIS/PERMA CATHETER REMOVAL N/A 08/15/2019   Procedure: DIALYSIS/PERMA CATHETER REMOVAL;  Surgeon: Renford Dills, MD;  Location: [name] INVASIVE CV LAB;  Service: Cardiovascular;  Laterality: N/A;   DIALYSIS/PERMA CATHETER REMOVAL N/A 03/15/2022   Procedure: DIALYSIS/PERMA CATHETER REMOVAL;  Surgeon: Annice Needy, MD;  Location: [name] INVASIVE CV LAB;  Service: Cardiovascular;  Laterality: N/A;   EMBOLIZATION (CATH LAB) Right 02/06/2021   Procedure: EMBOLIZATION;  Surgeon: Annice Needy, MD;  Location: [name] INVASIVE CV LAB;  Service: Cardiovascular;  Laterality: Right;  Right Upper Extremity Dialysis Access, Permcath Placement   ESOPHAGOGASTRODUODENOSCOPY (EGD) WITH PROPOFOL N/A 12/27/2017   Procedure: ESOPHAGOGASTRODUODENOSCOPY (EGD) WITH PROPOFOL;  Surgeon: Toledo, Boykin Nearing, MD;  Location: [name] ENDOSCOPY;  Service: Gastroenterology;  Laterality: N/A;   EYE SURGERY     LEFT HEART CATH AND CORONARY ANGIOGRAPHY N/A 02/26/2020   Procedure: LEFT HEART CATH AND CORONARY ANGIOGRAPHY;  Surgeon: Corky Crafts, MD;  Location: [facility] INVASIVE CV LAB;  Service: Cardiovascular;  Laterality: N/A;   PROSTATE SURGERY  2016   REVISON OF ARTERIOVENOUS FISTULA Right 07/22/2022   Procedure: RESECTION OF ANEURYSMAL RIGHT ARM ARTERIOVENOUS FISTULA;  Surgeon: Annice Needy, MD;   Location: [name] ORS;  Service: Vascular;  Laterality: Right;   TONSILECTOMY/ADENOIDECTOMY WITH MYRINGOTOMY     TONSILLECTOMY     UPPER EXTREMITY ANGIOGRAPHY Right 11/26/2020   Procedure: Upper Extremity Angiography;  Surgeon: Annice Needy, MD;  Location: [name] INVASIVE CV LAB;  Service: Cardiovascular;  Laterality: Right;   UPPER EXTREMITY ANGIOGRAPHY Right 02/05/2021   Procedure: UPPER EXTREMITY ANGIOGRAPHY;  Surgeon: Annice Needy, MD;  Location: [name] INVASIVE CV LAB;  Service: Cardiovascular;  Laterality: Right;   Family History  Problem Relation Age of Onset   CAD Father    Stroke Father    Diabetes Mellitus II Mother    Kidney failure Mother    Schizophrenia Mother    Social History:  reports that he has never smoked. He has never used smokeless tobacco. He reports that he does not drink alcohol and does not use drugs. Allergies:  Allergies  Allergen Reactions   Promethazine Diarrhea and Other (See Comments)    Muscle cramps   Promethazine Hcl Other (See Comments)    Cramping all over  Other reaction(s): cramp all over   Other     Other reaction(s): Unknown Other reaction(s): Unknown   Medications Prior to Admission  Medication Sig Dispense Refill   acetaminophen (TYLENOL) 325 MG tablet Take 1-2 tablets (325-650 mg total) by mouth every 4 (four) hours as needed for mild pain.     amiodarone (PACERONE) 200 MG tablet Take 1 tablet (200 mg total) by mouth every morning. 90 tablet 2   amLODipine (NORVASC) 5 MG tablet Take 5 mg by mouth daily.     ammonium lactate (AMLACTIN) 12 % cream Apply 1 Application topically as needed for dry skin. 385 g 0   atorvastatin (LIPITOR) 80 MG tablet TAKE ONE TABLET BY MOUTH AT BEDTIME. 90 tablet 3   calcium acetate (PHOSLO) 667 MG capsule Take 2 capsules (1,334 mg total) by mouth 3 (three) times daily with meals. 180 capsule 0   carvedilol (COREG) 25 MG tablet Take 25 mg by mouth 2 (two) times daily.     dorzolamide-timolol (COSOPT) 2-0.5 %  ophthalmic solution Place 1 drop into the right eye 2 (two) times daily.     levETIRAcetam (KEPPRA) 250 MG tablet Take 1 tablet (250 mg total) by mouth Every Tuesday,Thursday,and Saturday with dialysis. 15 tablet 1   levETIRAcetam (KEPPRA) 500 MG tablet Take 1 tablet (500 mg total) by mouth daily after breakfast. 30 tablet 1   lidocaine-prilocaine (EMLA) cream Apply topically.     amphetamine-dextroamphetamine (ADDERALL XR) 5 MG 24 hr capsule Take 5 mg by mouth daily.     fluticasone (FLONASE) 50 MCG/ACT nasal spray Place 2 sprays into both nostrils daily. 16 g 0   hydrALAZINE (APRESOLINE) 25 MG tablet Take 25 mg by mouth 3 (three) times daily. (Patient not taking: Reported on 02/03/2023)     hydrocortisone (ANUSOL-HC) 2.5 % rectal cream Place 1 Application rectally 2 (two) times daily. (Patient not taking: Reported on 02/03/2023) 30 g 0   nitroGLYCERIN (NITROSTAT) 0.4 MG SL tablet Place 1 tablet (0.4 mg total) under the tongue every 5 (five) minutes as needed for chest pain. 25 tablet 6   polyethylene glycol (MIRALAX / GLYCOLAX) 17 g packet Take 17 g by mouth 2 (two) times daily. (Patient taking differently: Take 17 g by mouth 2 (two) times daily. New medication from Dr. Burgess Estelle, have to pick up at Pharmacy.) 180 packet 0   psyllium (METAMUCIL) 58.6 % packet Take 1 packet by mouth 2 (two) times daily. (Patient taking differently: Take 1 packet by mouth 2 (two) times daily. New medication from Dr. Burgess Estelle, have to pick up at Pharmacy.) 60 packet 2    Home:    Functional History:   Functional Status:  Mobility:          ADL:    Cognition: Cognition Orientation Level: Disoriented to situation    Blood pressure 134/70, pulse (!) 39, temperature 98.3 F (36.8 C), temperature source Oral, resp. rate 17, height 6\' 3"  (1.905 m), weight 80 kg, SpO2 98%. Physical Exam Constitutional:      Comments: Pt awake, appears to be in pain  HENT:     Head: Normocephalic.     Right Ear:  External ear normal.     Left Ear: External ear normal.     Nose: Nose normal.     Mouth/Throat:     Mouth:  Mucous membranes are moist.  Eyes:     Conjunctiva/sclera: Conjunctivae normal.     Comments: Left eye lid closed  Cardiovascular:     Rate and Rhythm: Bradycardia present.  Pulmonary:     Effort: Pulmonary effort is normal.  Abdominal:     Palpations: Abdomen is soft.  Musculoskeletal:        General: Swelling and tenderness (RLE) present.     Cervical back: Normal range of motion.     Comments: Right leg with vac and in limb guard.   Skin:    General: Skin is warm.     Comments: AVF LUE. Right great toe amp  Neurological:     Comments: Pt is fairly alert. Borderline insight and awareness. Left eye closed. RUE 5/5. LUE 4/5. RLE not tested due to pain. LLE 3-4/5 prox to distal. Decreased LT distal LLE  Psychiatric:     Comments: Flat, distressed     Results for orders placed or performed during the hospital encounter of 02/03/23 (from the past 24 hours)  Type and screen [facility]     Status: None   Collection Time: 02/09/23  2:12 PM  Result Value Ref Range   ABO/RH(D) A POS    Antibody Screen NEG    Sample Expiration      02/12/2023,2359 Performed at [facility], [address], [place], [place] [address]   I-STAT, [place] 8     Status: Abnormal   Collection Time: 02/09/23  2:20 PM  Result Value Ref Range   Sodium 134 (L) 135 - 145 mmol/L   Potassium 4.0 3.5 - 5.1 mmol/L   Chloride 97 (L) 98 - 111 mmol/L   BUN 38 (H) 6 - 20 mg/dL   Creatinine, Ser 9.14 (H) 0.61 - 1.24 mg/dL   Glucose, Bld 78 70 - 99 mg/dL   Calcium, Ion 7.82 (L) 1.15 - 1.40 mmol/L   TCO2 29 22 - 32 mmol/L   Hemoglobin 9.9 (L) 13.0 - 17.0 g/dL   HCT 95.6 (L) 21.3 - 08.6 %  Glucose, capillary     Status: None   Collection Time: 02/09/23  4:41 PM  Result Value Ref Range   Glucose-Capillary 80 70 - 99 mg/dL   Comment 1 Notify RN   CBC with Differential/Platelet      Status: Abnormal   Collection Time: 02/10/23  3:10 AM  Result Value Ref Range   WBC 12.8 (H) 4.0 - 10.5 K/uL   RBC 3.85 (L) 4.22 - 5.81 MIL/uL   Hemoglobin 8.6 (L) 13.0 - 17.0 g/dL   HCT 57.8 (L) 46.9 - 62.9 %   MCV 74.8 (L) 80.0 - 100.0 fL   MCH 22.3 (L) 26.0 - 34.0 pg   MCHC 29.9 (L) 30.0 - 36.0 g/dL   RDW 52.8 (H) 41.3 - 24.4 %   Platelets 194 150 - 400 K/uL   nRBC 0.0 0.0 - 0.2 %   Neutrophils Relative % 89 %   Neutro Abs 11.4 (H) 1.7 - 7.7 K/uL   Lymphocytes Relative 5 %   Lymphs Abs 0.6 (L) 0.7 - 4.0 K/uL   Monocytes Relative 3 %   Monocytes Absolute 0.4 0.1 - 1.0 K/uL   Eosinophils Relative 0 %   Eosinophils Absolute 0.0 0.0 - 0.5 K/uL   Basophils Relative 0 %   Basophils Absolute 0.0 0.0 - 0.1 K/uL   Immature Granulocytes 3 %   Abs Immature Granulocytes 0.41 (H) 0.00 - 0.07 K/uL  Comprehensive metabolic panel     Status: Abnormal   Collection Time: 02/10/23  3:10 AM  Result Value Ref Range   Sodium 134 (L) 135 - 145 mmol/L   Potassium 3.7 3.5 - 5.1 mmol/L   Chloride 98 98 - 111 mmol/L   CO2 25 22 - 32 mmol/L   Glucose, Bld 149 (H) 70 - 99 mg/dL   BUN 45 (H) 6 - 20 mg/dL   Creatinine, Ser 6.57 (H) 0.61 - 1.24 mg/dL   Calcium 8.4 (L) 8.9 - 10.3 mg/dL   Total Protein 6.3 (L) 6.5 - 8.1 g/dL   Albumin 2.0 (L) 3.5 - 5.0 g/dL   AST 10 (L) 15 - 41 U/L   ALT 7 0 - 44 U/L   Alkaline Phosphatase 67 38 - 126 U/L   Total Bilirubin 0.5 <1.2 mg/dL   GFR, Estimated 9 (L) >60 mL/min   Anion gap 11 5 - 15  Glucose, capillary     Status: Abnormal   Collection Time: 02/10/23  3:53 AM  Result Value Ref Range   Glucose-Capillary 142 (H) 70 - 99 mg/dL   Comment 1 Notify RN    Comment 2 Document in Chart   Glucose, capillary     Status: Abnormal   Collection Time: 02/10/23  7:40 AM  Result Value Ref Range   Glucose-Capillary 103 (H) 70 - 99 mg/dL   PERIPHERAL VASCULAR CATHETERIZATION Result Date: 02/09/2023 Images from the original result were not included.   Patient name:  [name].  MRN: [id number]        DOB: 05-13-1970            Sex: male  02/03/2023 - 02/08/2023 Pre-operative Diagnosis: Bilateral foot wounds Post-operative diagnosis:  Same Surgeon:  Daria Pastures, MD Procedure Performed:  Ultrasound-guided access of right common femoral artery Second-order cannulation of left external iliac artery Aortogram and bilateral lower extremity angiogram Closure of right common femoral artery with Mynx closure device  Indications: 52 year old male with a significant history of type 2 diabetes, A-fib, HTN, ESRD on HD who is status post toe amputation on the right foot which has been nonhealing with recurrent infection and osteomyelitis.  He is planned to undergo right below the knee amputation with [name] on 02/09/2023 and was noted to have discoloration and possibly ischemic skin changes to the left heel.  Risks and benefits of angiogram with possible intervention were reviewed, he expressed understanding and is willing to proceed.  Findings: Widely patent aorta and bilateral renal arteries.  Widely patent bilateral iliac systems.   Left common femoral, profunda and SFA are widely patent without flow-limiting stenosis.  The popliteal artery and all 3 tibial vessels are patent but with medial calcinosis, no flow-limiting stenosis.  Three-vessel runoff to the foot.  There is microvascular disease in the foot and the pedal arch is noted to be absent.  Right common femoral, found and SFA are widely patent without flow-limiting stenosis.  The popliteal artery and all 3 tibial vessels are patent with medial calcinosis, no flow-limiting stenosis.  Three-vessel runoff to the foot but there is microvascular disease with minimal filling in pedal vessels.             Procedure:  The patient was identified in the holding area and taken to the cath lab  The patient was then placed supine on the table and prepped and draped in the usual sterile fashion.  A time out was called.  Ultrasound  was used  to evaluate the right common femoral artery.  It was patent .  A digital ultrasound image was acquired.  A micropuncture needle was used to access the right common femoral artery under ultrasound guidance.  An 018 wire was advanced without resistance and a micropuncture sheath was placed.  The 018 wire was removed and a benson wire was placed.  The micropuncture sheath was exchanged for a 5 french sheath.  An omniflush catheter was advanced over the wire to the level of L-1.  An abdominal angiogram was obtained.  Next, using the omniflush catheter and a glide advantage wire, the aortic bifurcation was crossed and the catheter was placed into theleft external iliac artery and left runoff was obtained. This demonstrated the above findings. right runoff was performed via retrograde sheath injections which demonstrated the above findings.  Contrast: 95 cc  Impression: Inline flow to the foot with three-vessel runoff bilaterally but with microvascular disease and minimal filling pedal vessels bilaterally.   Daria Pastures MD [facility] Office: [phone]    Assessment/Plan: Diagnosis: 52 yo male with hx of stroke and PAD who is now POD#1 right BKA Does the need for close, 24 hr/day medical supervision in concert with the patient's rehab needs make it unreasonable for this patient to be served in a less intensive setting? Potentially Co-Morbidities requiring supervision/potential complications:  -ESRD on HD -post-op pain -previous stroke Due to bladder management, bowel management, safety, skin/wound care, disease management, medication administration, and pain management, does the patient require 24 hr/day rehab nursing? Potentially Does the patient require coordinated care of a physician, rehab nurse, therapy disciplines of PT, OT to address physical and functional deficits in the context of the above medical diagnosis(es)? Potentially Addressing deficits in the  following areas: balance, endurance, locomotion, strength, transferring, bowel/bladder control, bathing, dressing, feeding, grooming, toileting, and psychosocial support Can the patient actively participate in an intensive therapy program of at least 3 hrs of therapy per day at least 5 days per week? Potentially The potential for patient to make measurable gains while on inpatient rehab is good and fair Anticipated functional outcomes upon discharge from inpatient rehab are supervision and min assist  with PT, supervision and min assist with OT, n/a with SLP. Estimated rehab length of stay to reach the above functional goals is: TBD Anticipated discharge destination:  TBD Overall Rehab/Functional Prognosis: good and fair  POST ACUTE RECOMMENDATIONS: This patient's condition is appropriate for continued rehabilitative care in the following setting:  see below Patient has agreed to participate in recommended program. N/A Note that insurance prior authorization may be required for reimbursement for recommended care.  Comment: Pt has potential to participate in our program. Goals would be at a wheelchair level. My concern is that his daughter works during the day, and she states that he was having some safety issues prior to this admit. Await therapy evals. Rehab Admissions Coordinator to follow up.    I  Thanks,  Ranelle Oyster, MD 02/10/2023

## 2023-02-10 NOTE — Progress Notes (Signed)
[facility] Progress Note  Subjective: No c/o today. 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   Vitals:   02/10/23 0350 02/10/23 0431 02/10/23 0738 02/10/23 1109  BP: 134/70  134/70 107/62  Pulse: (!) 52 (!) 57 (!) 39 62  Resp: 15 10 17 20   Temp: 98.5 F (36.9 C)  98.3 F (36.8 C) 98.8 F (37.1 C)  TempSrc: Oral  Oral Oral  SpO2: 98% 94% 98% 98%  Weight:  80 kg    Height:        Exam: Gen alert, no distress, chronically ill appearing Looks better No jvd or bruits Chest L clear, R dec'd at base RRR no RG Abd soft ntnd no mass or ascites +bs Ext R BKA , LLE no edema Neuro is alert nf     LUA AVF+bruit       Renal-related home meds: - norvasc, coreg, hydralazine - phoslo 2 ac tid      OP HD: [facility]  3.5h  bfr 400   85kg    2/2.5   LUA AVF   Heparin 1800 bolus + 400u/hr - rocaltrol 2.25 three times per week - sensipar 60mg  three times per week - mircera 90 mcg q 2     Assessment/ Plan: Diabetic foot infection - on IV abx, poss osteo per MRI. Per pmd/ podiatry. Angiogram showed small vessel disease. 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  ESKD - pt on TTS schedule. Had HD here Monday and Wed. Next HD Sat to get back on schedule.  HTN - bp's good, cont home meds Volume - well under dry wt, no fluid off w/ next HD Anemia of eskd - Hb 8- 9 here. Follow.  MBD ckd - CCa a bit high, phos in range. Hold any vdra. Cont binders w/ meals.      Vinson Moselle MD  [facility] 02/10/2023, 4:02 PM  Recent Labs  Lab 02/05/23 0620 02/07/23 0318 02/09/23 0323 02/09/23 1420 02/10/23 0310  HGB 8.5*   < > 7.9* 9.9* 8.6*  ALBUMIN  --   --   --   --  2.0*  CALCIUM 8.9   < > 8.7*  --  8.4*  PHOS 5.5*  --   --   --   --   CREATININE 10.81*   < > 9.40* 5.90* 6.73*  K 3.2*   < > 4.1 4.0 3.7   < > = values in this interval not displayed.   No results for input(s): "IRON", "TIBC", "FERRITIN" in the last 168 hours. Inpatient medications:  amiodarone  200 mg Oral Daily   amphetamine-dextroamphetamine   5 mg Oral Daily   vitamin C  1,000 mg Oral Daily   atorvastatin  80 mg Oral QHS   calcium acetate  1,334 mg Oral TID WC   carvedilol  12.5 mg Oral BID   Chlorhexidine Gluconate Cloth  6 each Topical Q0600   Chlorhexidine Gluconate Cloth  6 each Topical Q0600   dorzolamide-timolol  1 drop Right Eye BID   mupirocin ointment  1 Application Nasal BID   nutrition supplement (JUVEN)  1 packet Oral BID BM   nystatin   Topical TID   sodium chloride flush  3 mL Intravenous Q12H   sodium chloride flush  3 mL Intravenous Q12H   sodium chloride flush  3-10 mL Intravenous Q12H   zinc sulfate (50mg  elemental zinc)  220 mg Oral Daily    levETIRAcetam 250 mg (02/09/23 1232)   levETIRAcetam 500 mg (02/10/23 1024)  magnesium sulfate bolus IVPB     vancomycin 750 mg (02/07/23 1620)   acetaminophen **OR** acetaminophen, camphor-menthol, Gerhardt's butt cream, hydrALAZINE, HYDROcodone-acetaminophen, HYDROmorphone (DILAUDID) injection, labetalol, lidocaine, magnesium sulfate bolus IVPB, ondansetron (ZOFRAN) IV, oxyCODONE, potassium chloride, sodium chloride flush, sodium chloride flush

## 2023-02-10 NOTE — Progress Notes (Signed)
IP rehab admissions - Noted PT/OT recommending SNF placement.  I met with patient, wife and an uncle at the bedside.  All are in agreement to SNF placement.  I have let TOC know if desire for SNF placement.  Call me for questions.  [phone]

## 2023-02-10 NOTE — Progress Notes (Signed)
Patient ID: [name] Barns., male   DOB: [birth year], 52 y.o.   MRN: [id number] Patient is postoperative day 1 right transtibial amputation.  Patient has no complaints this morning.  The wound VAC has a good suction fit without drainage.  Physical therapy, discharge planning based on therapy recommendations.

## 2023-02-11 ENCOUNTER — Encounter (HOSPITAL_COMMUNITY): Payer: Self-pay | Admitting: Family Medicine

## 2023-02-11 DIAGNOSIS — I10 Essential (primary) hypertension: Secondary | ICD-10-CM | POA: Diagnosis not present

## 2023-02-11 DIAGNOSIS — Z87898 Personal history of other specified conditions: Secondary | ICD-10-CM

## 2023-02-11 DIAGNOSIS — N186 End stage renal disease: Secondary | ICD-10-CM | POA: Diagnosis not present

## 2023-02-11 DIAGNOSIS — I739 Peripheral vascular disease, unspecified: Secondary | ICD-10-CM | POA: Diagnosis not present

## 2023-02-11 DIAGNOSIS — Z89511 Acquired absence of right leg below knee: Secondary | ICD-10-CM | POA: Diagnosis not present

## 2023-02-11 LAB — GLUCOSE, CAPILLARY
Glucose-Capillary: 78 mg/dL (ref 70–99)
Glucose-Capillary: 79 mg/dL (ref 70–99)
Glucose-Capillary: 84 mg/dL (ref 70–99)
Glucose-Capillary: 88 mg/dL (ref 70–99)
Glucose-Capillary: 95 mg/dL (ref 70–99)
Glucose-Capillary: 98 mg/dL (ref 70–99)

## 2023-02-11 LAB — SURGICAL PATHOLOGY, GROSS ONLY (NOT ARMC)

## 2023-02-11 MED ORDER — LEVETIRACETAM ER 500 MG PO TB24: 500.0000 mg | ORAL_TABLET | ORAL | Status: DC

## 2023-02-11 MED ORDER — LEVETIRACETAM 750 MG PO TABS
750.0000 mg | ORAL_TABLET | ORAL | Status: DC
  Administered 2023-02-12 – 2023-02-17 (×3): 750 mg via ORAL
  Filled 2023-02-11 (×4): qty 1

## 2023-02-11 MED ORDER — LEVETIRACETAM 500 MG PO TABS
500.0000 mg | ORAL_TABLET | ORAL | Status: DC
  Administered 2023-02-13 – 2023-02-23 (×7): 500 mg via ORAL
  Filled 2023-02-11 (×7): qty 1

## 2023-02-11 MED ORDER — LOPERAMIDE HCL 2 MG PO CAPS
4.0000 mg | ORAL_CAPSULE | Freq: Once | ORAL | Status: AC
  Administered 2023-02-11: 4 mg via ORAL
  Filled 2023-02-11: qty 2

## 2023-02-11 MED ORDER — CHLORHEXIDINE GLUCONATE CLOTH 2 % EX PADS
6.0000 | MEDICATED_PAD | Freq: Every day | CUTANEOUS | Status: DC
  Administered 2023-02-12 – 2023-02-13 (×2): 6 via TOPICAL

## 2023-02-11 MED ORDER — APIXABAN 5 MG PO TABS: 5.0000 mg | ORAL_TABLET | Freq: Two times a day (BID) | ORAL | Status: DC

## 2023-02-11 MED ORDER — LEVETIRACETAM ER 750 MG PO TB24: 750.0000 mg | ORAL_TABLET | ORAL | Status: DC

## 2023-02-11 MED ORDER — ASPIRIN 81 MG PO TBEC
81.0000 mg | DELAYED_RELEASE_TABLET | Freq: Every day | ORAL | Status: DC
  Administered 2023-02-11 – 2023-02-18 (×8): 81 mg via ORAL
  Filled 2023-02-11 (×8): qty 1

## 2023-02-11 NOTE — TOC Progression Note (Signed)
Transition of Care (TOC) - Progression Note    Patient Details  Name: [name]. MRN: [id number] Date of Birth: [birth year]  Transition of Care [facility]) CM/SW Contact  Eduard Roux, [place] Phone Number: 02/11/2023, 4:55 PM  Clinical Narrative:     CSW spoke with patient's spouse- provided bed offers. She states she will discucss options w/ patient and informed weekend CSW of her choice.  TOC will continue to follow and assist with discharge planning.   Antony Blackbird, MSW, LCSW Clinical Social Worker       Barriers to Discharge: Continued Medical Work up  Expected Discharge Plan and Services In-house Referral: Clinical Social Work     Living arrangements for the past 2 months: Single Family Home                                       Social Determinants of Health (SDOH) Interventions SDOH Screenings   Food Insecurity: Patient Declined (02/04/2023)  Housing: Patient Declined (02/04/2023)  Transportation Needs: Patient Declined (02/04/2023)  Utilities: Patient Declined (02/04/2023)  Financial Resource Strain: High Risk (07/05/2017)  Physical Activity: Inactive (07/05/2017)  Social Connections: Moderately Integrated (07/05/2017)  Stress: Stress Concern Present (07/05/2017)  Tobacco Use: Low Risk  (02/09/2023)  Health Literacy: Low Risk  (06/01/2020)   Received from [facility], [facility]    Readmission Risk Interventions    07/27/2021   11:49 AM 11/27/2020    3:06 PM  Readmission Risk Prevention Plan  Transportation Screening Complete Complete  PCP or Specialist Appt within 3-5 Days  Complete  HRI or Home Care Consult  Complete  Social Work Consult for Recovery Care Planning/Counseling  Complete  Palliative Care Screening  Not Applicable  Medication Review Oceanographer) Complete Complete  SW Recovery Care/Counseling Consult Complete   Palliative Care Screening Not Applicable   Skilled Nursing Facility Not Applicable

## 2023-02-11 NOTE — Progress Notes (Signed)
Physical Therapy Treatment Patient Details Name: [name]. MRN: [id number] DOB: 06/10/1970 Today's Date: 02/11/2023   History of Present Illness Pt is a 52 yr old male who presented 02/03/23 due to necrotic gangrenous R foot and ischemic changes to L heel.  Pt s/p 02/04/23 partial fourth ray amputation and resection of 5th metatarsal bone. 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 PMH ESRD  HTN, amputation L big toe, L eye absent, CVA, afib, Glaucoma, seizure,  PVD, CHF, CAD, chronic diarrhea,STEMI, osteomyelitis    PT Comments  Pt seen for PT tx with pt received in bed. Pt with flat affect, minimal verbal conversation, minimal enragement, not receptive of education throughout session. PT encouraged pt to attempt sitting EOB, pt requiring total assist to move BLE off EOB 2/2 pt with limited to no participation but pt able to move both BLE back on to bed of his own volition. PT provided ongoing education re: need to participate in therapy & benefits of mobilizing. Will continue to follow pt acutely to progress mobility as able.   If plan is discharge home, recommend the following: Two people to help with walking and/or transfers;Two people to help with bathing/dressing/bathroom;Assistance with cooking/housework;Assist for transportation;Help with stairs or ramp for entrance   Can travel by private vehicle     No  Equipment Recommendations  Wheelchair (measurements PT);Wheelchair cushion (measurements PT);Hospital bed;Hoyer lift;Other (comment) (slide board)    Recommendations for Other Services       Precautions / Restrictions Precautions Precautions: Fall Precaution Comments: R BKA 12/18 with limb protector; watch BP (hx of syncope); wound vac R leg Restrictions Weight Bearing Restrictions Per Provider Order: Yes RLE Weight Bearing Per Provider Order: Non weight bearing Other Position/Activity Restrictions: limb protector     Mobility  Bed Mobility                General bed mobility comments: PT attempted to assist pt to sitting EOB (exit R side of bed). Pt requires TOTAL assist to move BLE to EOB as pt not participating despite encouragement/cuing, assistance to place LUE on R bed rail & cuing to assist with pulling but no participation. After multiple attempts, pt able to move BOTH BLE onto bed without assistance, Pt does require assistance for repositioning straight in bed 2/2 decreased willingness to attempt.    Transfers                        Ambulation/Gait                   Stairs             Wheelchair Mobility     Tilt Bed    Modified Rankin (Stroke Patients Only)       Balance                                            Cognition Arousal: Alert Behavior During Therapy: Flat affect Overall Cognitive Status: Difficult to assess                                 General Comments: Pt with limited engagement with PT, not receptive to education/encouragement, minimal participation throughout session.        Exercises      General Comments  General comments (skin integrity, edema, etc.): PT secured RLE limb protector on LE. BP in RUE at beginning of session 129/62 mmHg MAP 80.      Pertinent Vitals/Pain Pain Assessment Pain Assessment: Faces Faces Pain Scale: Hurts whole lot Pain Location: RLE Pain Descriptors / Indicators: Grimacing, Guarding, Discomfort Pain Intervention(s): Monitored during session, Repositioned, Limited activity within patient's tolerance    Home Living                          Prior Function            PT Goals (current goals can now be found in the care plan section) Acute Rehab PT Goals Patient Stated Goal: to reduce pain PT Goal Formulation: With patient/family Time For Goal Achievement: 02/24/23 Potential to Achieve Goals: Fair Additional Goals Additional Goal #1: Pt will be able to propel a w/c >/= 250 ft with  supervision Progress towards PT goals: PT to reassess next treatment    Frequency    Min 1X/week      PT Plan      Co-evaluation              AM-PAC PT "6 Clicks" Mobility   Outcome Measure  Help needed turning from your back to your side while in a flat bed without using bedrails?: A Lot Help needed moving from lying on your back to sitting on the side of a flat bed without using bedrails?: Total Help needed moving to and from a bed to a chair (including a wheelchair)?: Total Help needed standing up from a chair using your arms (e.g., wheelchair or bedside chair)?: Total Help needed to walk in hospital room?: Total Help needed climbing 3-5 steps with a railing? : Total 6 Click Score: 7    End of Session Equipment Utilized During Treatment:  (R limb guard) Activity Tolerance: Patient limited by pain (self limiting) Patient left: in bed;with call bell/phone within reach;with bed alarm set   PT Visit Diagnosis: Muscle weakness (generalized) (M62.81);Difficulty in walking, not elsewhere classified (R26.2);Pain;Other abnormalities of gait and mobility (R26.89) Pain - Right/Left: Right Pain - part of body: Leg     Time: 8295-6213 PT Time Calculation (min) (ACUTE ONLY): 16 min  Charges:    $Therapeutic Activity: 8-22 mins PT General Charges $$ ACUTE PT VISIT: 1 Visit                     Aleda Grana, PT, DPT 02/11/23, 12:14 PM   Sandi Mariscal 02/11/2023, 12:09 PM

## 2023-02-11 NOTE — Progress Notes (Signed)
PROGRESS NOTE    [name].  DDU:[id number] DOB: 1970/07/27 DOA: 02/03/2023 PCP: [name], FNP  Subjective: Pt seen and examined.  Pt on air mattress now. Being turned from side to side. Wife not at bedside. Pt awake. No distress   Hospital Course: HPI: [name]. is a 52 y.o. male with medical history significant of type 2 diabetes mellitus, acute osteomyelitis of toe of right foot with gangrene status post amputation of the great toe on the right foot, paroxysmal atrial fibrillation not on anticoagulation, hypertension, ESRD on HD, seizure disorder, BPH, diabetic polyneuropathy, GERD, gastroparesis, hypertension was sent to ED from podiatry office.  Per patient and his wife who is at the bedside, patient's wife noted a wound on his right foot on Sunday.  He called his podiatrist/Dr. Wagoner and was given an appointment to see today.  Being seen in the podiatry office today, he was noted to have significant pus and infected wound, he was directed to the emergency department for further workup and admission.  Patient denies any complaint, no pain.  Per wife, patient typically does not notice things himself until she does.  No reports of fever, chills, sweating, nausea, vomiting any other complaint or any foot trauma, any recent travel or sick contact.   ED Course: Hemodynamically stable upon arrival.  X-ray right foot unremarkable.  MRI ordered and pending.  Patient received cefepime, Flagyl and vancomycin in the ED and nephrology was consulted for dialysis and hospitalist were consulted for admission.  Significant Events: Admitted 02/03/2023 for diabetic right foot ulcer with concern for acute osteomyelitis   Significant Labs: Admission WBC 22.3, Bicarb 13, BUN 122, Scr 18.57  Significant Imaging Studies: MRI Right foot Substantial fluid/edema tracking within along the quadratus plantae, abductor hallucis, flexor digitorum brevis, third and fourth plantar and  dorsal interosseous, and abductor digiti minimi muscles, confluent with subcutaneous edema which is primarily infiltrative but which may have small fluid pockets tracking dorsal lateral to the fifth metatarsal, dorsal to the fourth metatarsal and into the fourth toe, and in the plantar subcutaneous tissues below the distal fourth and fifth metatarsals. Appearance concerning for active infection. 2. Cortical irregularity and mild marrow edema in the head of the fifth metatarsal suspicious for early osteomyelitis. 3. Previous small toe amputation at the MTP joint.  Antibiotic Therapy: Anti-infectives (From admission, onward)    Start     Dose/Rate Route Frequency Ordered Stop   02/08/23 1800  vancomycin (VANCOREADY) IVPB 750 mg/150 mL  Status:  Discontinued        750 mg 150 mL/hr over 60 Minutes Intravenous Every T-Th-Sa (Hemodialysis) 02/04/23 2042 02/07/23 1436   02/07/23 1530  vancomycin (VANCOREADY) IVPB 750 mg/150 mL        750 mg 150 mL/hr over 60 Minutes Intravenous Every M-W-F (Hemodialysis) 02/07/23 1436     02/05/23 0100  vancomycin (VANCOREADY) IVPB 750 mg/150 mL        750 mg 150 mL/hr over 60 Minutes Intravenous  Once 02/04/23 2042 02/05/23 0509   02/04/23 1800  ceFEPIme (MAXIPIME) 1 g in sodium chloride 0.9 % 100 mL IVPB        1 g 200 mL/hr over 30 Minutes Intravenous Every 24 hours 02/03/23 1401     12 /12/24 1330  vancomycin (VANCOCIN) IVPB 1000 mg/200 mL premix       Placed in "Followed by" Linked Group   1,000 mg 200 mL/hr over 60 Minutes Intravenous  Once 02/03/23 1224 02/03/23 1513  02/03/23 1230  metroNIDAZOLE (FLAGYL) IVPB 500 mg        500 mg 100 mL/hr over 60 Minutes Intravenous  Once 02/03/23 1221 02/03/23 1402   02/03/23 1230  ceFEPIme (MAXIPIME) 2 g in sodium chloride 0.9 % 100 mL IVPB        2 g 200 mL/hr over 30 Minutes Intravenous  Once 02/03/23 1224 02/03/23 1352   02/03/23 1230  vancomycin (VANCOCIN) IVPB 1000 mg/200 mL premix       Placed in "Followed by"  Linked Group   1,000 mg 200 mL/hr over 60 Minutes Intravenous  Once 02/03/23 1224 02/03/23 1412       Procedures:   Consultants: Nephrology Vascular surgery orthopedics    Assessment and Plan: * Gangrene of right foot (HCC) On admission to 02-06-2023. Pt has osteomyelitis of right foot. Pt taken to OR on 02-04-2023 by podiatry. Pt had Partial fourth ray amputation, right foot.  Further resection of fifth metatarsal bone, right foot. [name] with orthopedics saw the patient and felt that pt had gangrene of the foot.  02-07-2023 pt will need right BKA by ortho. Planned on Wednesday Feb 09, 2023. 02-08-2023 angiogram today to see if he will need left lower extremity surgery. 02-10-2023 s/p right BKA on 02-09-2023. Gangrene has been surgically cured.  S/P BKA (below knee amputation), right [facility]) - 02-09-2023 02-09-2023 s/p right BKA. Management per ortho.  02-10-2023 family has decided on SNF at discharge. CM aware.  02-11-2023 awaiting SNF plans  PAD (peripheral artery disease) (HCC) 02-07-2023 pt noted to hypoglycemic today. Angiogram canceled.  Angiogram to make sure that he doesn't need left BKA at the same time. 02-08-2023 angiogram today to see if he will need left lower extremity surgery.   02-09-2023 angiogram on 02-08-2023 shows blood flow to left foot. "Inline flow to the foot with three-vessel runoff bilaterally but with microvascular disease and minimal filling pedal vessels bilaterally."  02-10-2023 remains on lipitor. Will need as least ASA 81 mg daily at discharge 02-11-2023 start ASA 81 mg daily.  History of seizure 02-11-2023 change over to po keppra. Was on IV keppra due to NPO status. Will have pharmacy dose to match his regular outpatient schedule.  History of CVA (cerebrovascular accident) - with ICH while on plavix and Eliquis in 11-2021. 02-11-2023 Chronic. Will convert back to po keppra.  Coronary artery disease 02-07-2023 stable. 02-11-2023  restart ASA 81 mg daily. On lipitor 80 mg at bedtime. On coreg 12.5 mg bid  ESRD on hemodialysis (HCC) 02-07-2023 thru 02-19-2023 continue with HD per nephrology 02-10-2023 on HD  Anemia due to chronic kidney disease, on chronic dialysis (HCC) 02-07-2023 stable. 02-10-2023  Hgb 8.6  Paroxysmal atrial fibrillation (HCC) - not on systemic anticoagulation due to ICH while on Eliquis in 11-2021. 02-07-2023 continue with amiodarone 200 mg qday 02-08-2023 off systemic anticoagulation in preparation BKA  02-09-2023 will need to ask ortho when systemic anticoagulation can be resumed after his right BKA. 02-10-2023 have asked ortho when eliquis can be restarted 02-11-2023 Continue amiodarone 200 mg qday. Upon further review of his old EMR and cardiology records. Pt was taken off systemic anticoagulation by neurology after his intracerebral hemorrhage in 11-2021.  Pt was also on plavix at that time. This was also stopped.  However, given he has PVD and it at risk of losing his left LE to amputation, I think that low dose ASA is the best that can be done to balance his PVD, CVA risk and minimize bleeding.  Uncontrolled type 2 diabetes mellitus with hyperglycemia, with long-term current use of insulin (HCC) 02-07-2023 CBG low today. Pt confused. Will give 1/2 amp D50 IV. Hypoglycemia may be due to pt being NPO for his angiogram today. 02-08-2023 diabetic hypoglycemia improved with IVF with dextrose. 02-09-2023 continue D5 LR @ 25 ml/hr until po intake back to normal. 02-10-2023 pt advised he need to continue to eat 3 times a day, even if it is just a snack.  Essential hypertension, benign 02-07-2023 continue coreg 12.5 mg bid. 02-08-2023 stable. Continue coreg 02-09-2023 stable. 02-10-2023 wife states pt has a cough from HTN meds. Pt on coreg for BP and afib. Changing to toprol-xl will also likely give him cough. Wife agreeable to staying on coreg.  02-11-2023 hold parameters placed on coreg. Hold  for SBP <120 or HR <60   DVT prophylaxis: SCD's Start: 02/09/23 1743 Place and maintain sequential compression device Start: 02/05/23 1515    Code Status: Full Code Family Communication: no family at bedside Disposition Plan: SNF Reason for continuing need for hospitalization: medically stable.  Objective: Vitals:   02/11/23 0500 02/11/23 0754 02/11/23 0922 02/11/23 0927  BP: (!) 112/48 94/61 115/87   Pulse: 65 76 65   Resp: 16 19 17 13   Temp: 98.6 F (37 C) 98.6 F (37 C)    TempSrc: Oral Oral    SpO2: 98% 95%    Weight: 78.4 kg     Height:        Intake/Output Summary (Last 24 hours) at 02/11/2023 0941 Last data filed at 02/11/2023 0300 Gross per 24 hour  Intake 120 ml  Output 0 ml  Net 120 ml   Filed Weights   02/09/23 1334 02/10/23 0431 02/11/23 0500  Weight: 80.5 kg 80 kg 78.4 kg    Examination:  Physical Exam Vitals and nursing note reviewed.  Constitutional:      General: He is not in acute distress.    Appearance: He is not toxic-appearing or diaphoretic.  HENT:     Head: Normocephalic and atraumatic.     Nose: Nose normal.  Eyes:     Comments: Missing left eye  Cardiovascular:     Rate and Rhythm: Normal rate and regular rhythm.  Pulmonary:     Effort: Pulmonary effort is normal.     Breath sounds: Normal breath sounds.  Abdominal:     General: Bowel sounds are normal.     Palpations: Abdomen is soft.  Musculoskeletal:     Comments: Right BKA  Skin:    General: Skin is warm and dry.  Neurological:     Mental Status: He is alert.     Data Reviewed: I have personally reviewed following labs and imaging studies  CBC: Recent Labs  Lab 02/05/23 0620 02/07/23 0318 02/09/23 0323 02/09/23 1420 02/10/23 0310  WBC 22.6* 17.9* 16.1*  --  12.8*  NEUTROABS  --   --   --   --  11.4*  HGB 8.5* 8.1* 7.9* 9.9* 8.6*  HCT 25.3* 25.4* 25.5* 29.0* 28.8*  MCV 69.1* 70.8* 72.6*  --  74.8*  PLT 209 224 219  --  194   Basic Metabolic Panel: Recent  Labs  Lab 02/05/23 0620 02/07/23 0318 02/09/23 0323 02/09/23 1420 02/10/23 0310  NA 139 135 136 134* 134*  K 3.2* 4.0 4.1 4.0 3.7  CL 101 101 97* 97* 98  CO2 22 19* 24  --  25  GLUCOSE 90 103* 98 78 149*  BUN 65* 105*  72* 38* 45*  CREATININE 10.81* 14.30* 9.40* 5.90* 6.73*  CALCIUM 8.9 8.7* 8.7*  --  8.4*  MG 1.8  --   --   --   --   PHOS 5.5*  --   --   --   --    GFR: Estimated Creatinine Clearance: 13.9 mL/min (A) (by C-G formula based on SCr of 6.73 mg/dL (H)). Liver Function Tests: Recent Labs  Lab 02/10/23 0310  AST 10*  ALT 7  ALKPHOS 67  BILITOT 0.5  PROT 6.3*  ALBUMIN 2.0*   CBG: Recent Labs  Lab 02/10/23 1618 02/10/23 2002 02/10/23 2334 02/11/23 0528 02/11/23 0812  GLUCAP 90 91 92 79 95   Lipid Profile: Recent Labs    02/09/23 0323  CHOL 60  HDL 14*  LDLCALC 23  TRIG 308  CHOLHDL 4.3    Recent Results (from the past 240 hours)  Culture, blood (routine x 2)     Status: None   Collection Time: 02/03/23 12:22 PM   Specimen: BLOOD RIGHT ARM  Result Value Ref Range Status   Specimen Description BLOOD RIGHT ARM  Final   Special Requests   Final    BOTTLES DRAWN AEROBIC AND ANAEROBIC Blood Culture results may not be optimal due to an inadequate volume of blood received in culture bottles   Culture   Final    NO GROWTH 5 DAYS Performed at [facility], [address], [place], [place] [address]    Report Status 02/08/2023 FINAL  Final  Culture, blood (routine x 2)     Status: None   Collection Time: 02/03/23 12:27 PM   Specimen: BLOOD RIGHT HAND  Result Value Ref Range Status   Specimen Description BLOOD RIGHT HAND  Final   Special Requests   Final    BOTTLES DRAWN AEROBIC AND ANAEROBIC Blood Culture results may not be optimal due to an inadequate volume of blood received in culture bottles   Culture   Final    NO GROWTH 5 DAYS Performed at [facility], [address], [place], [place] [address]    Report Status 02/08/2023  FINAL  Final  Aerobic/Anaerobic Culture w Gram Stain (surgical/deep wound)     Status: None   Collection Time: 02/04/23 12:06 PM   Specimen: Foot, Right; Tissue  Result Value Ref Range Status   Specimen Description TISSUE  Final   Special Requests RIGHT FOOT  Final   Gram Stain   Final    FEW WBC PRESENT, PREDOMINANTLY PMN ABUNDANT GRAM POSITIVE COCCI IN PAIRS RARE GRAM NEGATIVE COCCOBACILLI    Culture   Final    ABUNDANT STAPHYLOCOCCUS AUREUS MODERATE CORYNEBACTERIUM STRIATUM Standardized susceptibility testing for this organism is not available. NO ANAEROBES ISOLATED Performed at [facility], [address], [place], [place] [address]    Report Status 02/09/2023 FINAL  Final   Organism ID, Bacteria STAPHYLOCOCCUS AUREUS  Final      Susceptibility   Staphylococcus aureus - MIC*    CIPROFLOXACIN <=0.5 SENSITIVE Sensitive     ERYTHROMYCIN RESISTANT Resistant     GENTAMICIN <=0.5 SENSITIVE Sensitive     OXACILLIN <=0.25 SENSITIVE Sensitive     TETRACYCLINE <=1 SENSITIVE Sensitive     VANCOMYCIN 1 SENSITIVE Sensitive     TRIMETH/SULFA <=10 SENSITIVE Sensitive     CLINDAMYCIN RESISTANT Resistant     RIFAMPIN <=0.5 SENSITIVE Sensitive     Inducible Clindamycin POSITIVE Resistant     LINEZOLID 2 SENSITIVE  Sensitive     * ABUNDANT STAPHYLOCOCCUS AUREUS  Aerobic/Anaerobic Culture w Gram Stain (surgical/deep wound)     Status: None   Collection Time: 02/04/23 12:06 PM   Specimen: Foot, Right; Wound  Result Value Ref Range Status   Specimen Description WOUND  Final   Special Requests RIGHT FOOT  Final   Gram Stain   Final    MODERATE WBC PRESENT, PREDOMINANTLY PMN MODERATE GRAM POSITIVE COCCI    Culture   Final    ABUNDANT STAPHYLOCOCCUS AUREUS SUSCEPTIBILITIES PERFORMED ON PREVIOUS CULTURE WITHIN THE LAST 5 DAYS. MODERATE CORYNEBACTERIUM STRIATUM Standardized susceptibility testing for this organism is not available. NO ANAEROBES ISOLATED Performed at [facility], [address] [place], [place] [address]    Report Status 02/09/2023 FINAL  Final  Surgical pcr screen     Status: Abnormal   Collection Time: 02/08/23  8:05 PM   Specimen: Nasal Mucosa; Nasal Swab  Result Value Ref Range Status   MRSA, PCR NEGATIVE NEGATIVE Final   Staphylococcus aureus POSITIVE (A) NEGATIVE Final    Comment: (NOTE) The Xpert SA Assay (FDA approved for NASAL specimens in patients 17 years of age and older), is one component of a comprehensive surveillance program. It is not intended to diagnose infection nor to guide or monitor treatment. Performed at [facility], [address] [place], [place] [address]     Scheduled Meds:  amiodarone  200 mg Oral Daily   amphetamine-dextroamphetamine  5 mg Oral Daily   vitamin C  1,000 mg Oral Daily   aspirin EC  81 mg Oral Daily   atorvastatin  80 mg Oral QHS   calcium acetate  1,334 mg Oral TID WC   carvedilol  12.5 mg Oral BID   Chlorhexidine Gluconate Cloth  6 each Topical Q0600   Chlorhexidine Gluconate Cloth  6 each Topical Q0600   dorzolamide-timolol  1 drop Right Eye BID   [START ON 02/12/2023] levETIRAcetam  500 mg Oral Once per day on Sunday Tuesday Thursday Saturday   [START ON 02/12/2023] levETIRAcetam  750 mg Oral Q M,W,F-HD   mupirocin ointment  1 Application Nasal BID   nutrition supplement (JUVEN)  1 packet Oral BID BM   nystatin   Topical TID   sodium chloride flush  3 mL Intravenous Q12H   sodium chloride flush  3 mL Intravenous Q12H   sodium chloride flush  3-10 mL Intravenous Q12H   zinc sulfate (50mg  elemental zinc)  220 mg Oral Daily   Continuous Infusions:  levETIRAcetam 250 mg (02/09/23 1232)     LOS: 8 days   Time spent: 40 minutes  Carollee Herter, DO  [facility]  02/11/2023, 9:41 AM

## 2023-02-11 NOTE — Assessment & Plan Note (Addendum)
02-11-2023 change over to po keppra. Was on IV keppra due to NPO status. Will have pharmacy dose to match his regular outpatient schedule. 02-12-2023 pt gets 500 mg po daily keppra. He gets an extra 250 mg of keppra on his HD days. So a total of 750 of keppra on HD days and only 500 mg of Keppra on NON-HD days.

## 2023-02-11 NOTE — Plan of Care (Signed)
  Problem: Education: Goal: Knowledge of General Education information will improve Description: Including pain rating scale, medication(s)/side effects and non-pharmacologic comfort measures Outcome: Progressing   Problem: Health Behavior/Discharge Planning: Goal: Ability to manage health-related needs will improve Outcome: Progressing   Problem: Clinical Measurements: Goal: Ability to maintain clinical measurements within normal limits will improve Outcome: Progressing Goal: Will remain free from infection Outcome: Progressing Goal: Diagnostic test results will improve Outcome: Progressing Goal: Respiratory complications will improve Outcome: Progressing Goal: Cardiovascular complication will be avoided Outcome: Progressing   Problem: Activity: Goal: Risk for activity intolerance will decrease Outcome: Progressing   Problem: Nutrition: Goal: Adequate nutrition will be maintained Outcome: Progressing   Problem: Coping: Goal: Level of anxiety will decrease Outcome: Progressing   Problem: Elimination: Goal: Will not experience complications related to bowel motility Outcome: Progressing Goal: Will not experience complications related to urinary retention Outcome: Progressing   Problem: Pain Management: Goal: General experience of comfort will improve Outcome: Progressing   Problem: Safety: Goal: Ability to remain free from injury will improve Outcome: Progressing   Problem: Skin Integrity: Goal: Risk for impaired skin integrity will decrease Outcome: Progressing   Problem: Education: Goal: Understanding of CV disease, CV risk reduction, and recovery process will improve Outcome: Progressing Goal: Individualized Educational Video(s) Outcome: Progressing   Problem: Activity: Goal: Ability to return to baseline activity level will improve Outcome: Progressing   Problem: Cardiovascular: Goal: Ability to achieve and maintain adequate cardiovascular perfusion  will improve Outcome: Progressing Goal: Vascular access site(s) Level 0-1 will be maintained Outcome: Progressing   Problem: Health Behavior/Discharge Planning: Goal: Ability to safely manage health-related needs after discharge will improve Outcome: Progressing   Problem: Education: Goal: Knowledge of the prescribed therapeutic regimen will improve Outcome: Progressing Goal: Ability to verbalize activity precautions or restrictions will improve Outcome: Progressing Goal: Understanding of discharge needs will improve Outcome: Progressing   Problem: Activity: Goal: Ability to perform//tolerate increased activity and mobilize with assistive devices will improve Outcome: Progressing   Problem: Clinical Measurements: Goal: Postoperative complications will be avoided or minimized Outcome: Progressing   Problem: Self-Care: Goal: Ability to meet self-care needs will improve Outcome: Progressing   Problem: Self-Concept: Goal: Ability to maintain and perform role responsibilities to the fullest extent possible will improve Outcome: Progressing   Problem: Pain Management: Goal: Pain level will decrease with appropriate interventions Outcome: Progressing   Problem: Skin Integrity: Goal: Demonstration of wound healing without infection will improve Outcome: Progressing

## 2023-02-11 NOTE — Progress Notes (Signed)
[facility] Progress Note  Subjective: No c/o today. Resting.   Vitals:   02/11/23 0922 02/11/23 0927 02/11/23 1115 02/11/23 1400  BP: 115/87  (!) 120/54   Pulse: 65  65 (!) 43  Resp: 17 13 18    Temp:   98.1 F (36.7 C)   TempSrc:   Oral   SpO2:   92%   Weight:      Height:        Exam: Gen alert, no distress, chronically ill appearing Looks better No jvd or bruits Chest L clear, R dec'd at base RRR no RG Abd soft ntnd no mass or ascites +bs Ext R BKA , LLE no edema Neuro is alert nf     LUA AVF+bruit       Renal-related home meds: - norvasc, coreg, hydralazine - phoslo 2 ac tid      OP HD: [facility]  3.5h  bfr 400   85kg    2/2.5   LUA AVF   Heparin 1800 bolus + 400u/hr - rocaltrol 2.25 three times per week - sensipar 60mg  three times per week - mircera 90 mcg q 2     Assessment/ Plan: Diabetic foot infection - on IV abx, poss osteo per MRI. Per pmd/ podiatry. Angiogram showed small vessel disease. 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  ESKD - pt on TTS schedule. Had HD here Monday and Wed. Next HD Sat to get back on schedule.  HTN - bp's good, cont home meds Volume - well under dry wt, no fluid off w/ next HD on Sat.  Anemia of eskd - Hb 8- 9 here. Follow.  MBD ckd - CCa a bit high, phos in range. Hold any vdra. Cont binders w/ meals.   Vinson Moselle MD  [facility] 02/11/2023, 3:10 PM  Recent Labs  Lab 02/05/23 0620 02/07/23 0318 02/09/23 0323 02/09/23 1420 02/10/23 0310  HGB 8.5*   < > 7.9* 9.9* 8.6*  ALBUMIN  --   --   --   --  2.0*  CALCIUM 8.9   < > 8.7*  --  8.4*  PHOS 5.5*  --   --   --   --   CREATININE 10.81*   < > 9.40* 5.90* 6.73*  K 3.2*   < > 4.1 4.0 3.7   < > = values in this interval not displayed.   No results for input(s): "IRON", "TIBC", "FERRITIN" in the last 168 hours. Inpatient medications:  amiodarone  200 mg Oral Daily   amphetamine-dextroamphetamine  5 mg Oral Daily   vitamin C  1,000 mg Oral Daily   aspirin EC  81 mg Oral  Daily   atorvastatin  80 mg Oral QHS   calcium acetate  1,334 mg Oral TID WC   carvedilol  12.5 mg Oral BID   Chlorhexidine Gluconate Cloth  6 each Topical Q0600   Chlorhexidine Gluconate Cloth  6 each Topical Q0600   dorzolamide-timolol  1 drop Right Eye BID   [START ON 02/12/2023] levETIRAcetam  500 mg Oral Once per day on Sunday Monday Wednesday Friday   [START ON 02/12/2023] levETIRAcetam  750 mg Oral Once per day on Tuesday Thursday Saturday   mupirocin ointment  1 Application Nasal BID   nutrition supplement (JUVEN)  1 packet Oral BID BM   nystatin   Topical TID   sodium chloride flush  3 mL Intravenous Q12H   sodium chloride flush  3 mL Intravenous Q12H   sodium chloride flush  3-10 mL Intravenous Q12H   zinc sulfate (50mg  elemental zinc)  220 mg Oral Daily     acetaminophen **OR** acetaminophen, camphor-menthol, Gerhardt's butt cream, hydrALAZINE, HYDROcodone-acetaminophen, HYDROmorphone (DILAUDID) injection, labetalol, lidocaine, ondansetron (ZOFRAN) IV, oxyCODONE, potassium chloride, sodium chloride flush, sodium chloride flush

## 2023-02-12 DIAGNOSIS — Z89511 Acquired absence of right leg below knee: Secondary | ICD-10-CM | POA: Diagnosis not present

## 2023-02-12 DIAGNOSIS — N186 End stage renal disease: Secondary | ICD-10-CM | POA: Diagnosis not present

## 2023-02-12 DIAGNOSIS — I10 Essential (primary) hypertension: Secondary | ICD-10-CM | POA: Diagnosis not present

## 2023-02-12 DIAGNOSIS — I739 Peripheral vascular disease, unspecified: Secondary | ICD-10-CM | POA: Diagnosis not present

## 2023-02-12 LAB — CBC
HCT: 25.2 % — ABNORMAL LOW (ref 39.0–52.0)
Hemoglobin: 7.6 g/dL — ABNORMAL LOW (ref 13.0–17.0)
MCH: 22.7 pg — ABNORMAL LOW (ref 26.0–34.0)
MCHC: 30.2 g/dL (ref 30.0–36.0)
MCV: 75.2 fL — ABNORMAL LOW (ref 80.0–100.0)
Platelets: 241 10*3/uL (ref 150–400)
RBC: 3.35 MIL/uL — ABNORMAL LOW (ref 4.22–5.81)
RDW: 20.3 % — ABNORMAL HIGH (ref 11.5–15.5)
WBC: 18.1 10*3/uL — ABNORMAL HIGH (ref 4.0–10.5)
nRBC: 0 % (ref 0.0–0.2)

## 2023-02-12 LAB — GLUCOSE, CAPILLARY
Glucose-Capillary: 73 mg/dL (ref 70–99)
Glucose-Capillary: 76 mg/dL (ref 70–99)
Glucose-Capillary: 124 mg/dL — ABNORMAL HIGH (ref 70–99)
Glucose-Capillary: 79 mg/dL (ref 70–99)
Glucose-Capillary: 77 mg/dL (ref 70–99)

## 2023-02-12 LAB — RENAL FUNCTION PANEL
Albumin: 1.8 g/dL — ABNORMAL LOW (ref 3.5–5.0)
Anion gap: 18 — ABNORMAL HIGH (ref 5–15)
BUN: 92 mg/dL — ABNORMAL HIGH (ref 6–20)
CO2: 20 mmol/L — ABNORMAL LOW (ref 22–32)
Calcium: 8.4 mg/dL — ABNORMAL LOW (ref 8.9–10.3)
Chloride: 100 mmol/L (ref 98–111)
Creatinine, Ser: 11.72 mg/dL — ABNORMAL HIGH (ref 0.61–1.24)
GFR, Estimated: 5 mL/min — ABNORMAL LOW (ref >60)
Glucose, Bld: 77 mg/dL (ref 70–99)
Phosphorus: 7.2 mg/dL — ABNORMAL HIGH (ref 2.5–4.6)
Potassium: 4 mmol/L (ref 3.5–5.1)
Sodium: 138 mmol/L (ref 135–145)

## 2023-02-12 MED ORDER — HEPARIN SODIUM (PORCINE) 1000 UNIT/ML DIALYSIS: 1000.0000 [IU] | INTRAMUSCULAR | Status: DC | PRN

## 2023-02-12 MED ORDER — PENTAFLUOROPROP-TETRAFLUOROETH EX AERO: 1.0000 | INHALATION_SPRAY | CUTANEOUS | Status: DC | PRN

## 2023-02-12 MED ORDER — LIDOCAINE-PRILOCAINE 2.5-2.5 % EX CREA: 1.0000 | TOPICAL_CREAM | CUTANEOUS | Status: DC | PRN

## 2023-02-12 MED ORDER — ALTEPLASE 2 MG IJ SOLR: 2.0000 mg | Freq: Once | INTRAMUSCULAR | Status: DC | PRN

## 2023-02-12 MED ORDER — HEPARIN SODIUM (PORCINE) 1000 UNIT/ML IJ SOLN
1500.0000 [IU] | Freq: Once | INTRAMUSCULAR | Status: AC
  Filled 2023-02-12: qty 2

## 2023-02-12 MED ORDER — LIDOCAINE HCL (PF) 1 % IJ SOLN: 5.0000 mL | INTRAMUSCULAR | Status: DC | PRN

## 2023-02-12 MED ORDER — HEPARIN SODIUM (PORCINE) 1000 UNIT/ML DIALYSIS: 1500.0000 [IU] | INTRAMUSCULAR | Status: DC | PRN

## 2023-02-12 MED ORDER — NEPRO/CARBSTEADY PO LIQD: 237.0000 mL | ORAL | Status: DC | PRN

## 2023-02-12 MED ORDER — CINACALCET HCL 30 MG PO TABS
60.0000 mg | ORAL_TABLET | ORAL | Status: DC
Start: 2023-02-15 — End: 2023-02-23
  Administered 2023-02-17 – 2023-02-22 (×3): 60 mg via ORAL
  Filled 2023-02-12 (×5): qty 2

## 2023-02-12 MED ORDER — ANTICOAGULANT SODIUM CITRATE 4% (200MG/5ML) IV SOLN: 5.0000 mL | Status: DC | PRN

## 2023-02-12 MED ORDER — HEPARIN SODIUM (PORCINE) 1000 UNIT/ML DIALYSIS
2000.0000 [IU] | Freq: Once | INTRAMUSCULAR | Status: AC
  Administered 2023-02-12: 2000 [IU] via INTRAVENOUS_CENTRAL

## 2023-02-12 MED ORDER — HEPARIN SODIUM (PORCINE) 1000 UNIT/ML IJ SOLN: 2000.0000 [IU] | Freq: Once | INTRAMUSCULAR | Status: DC

## 2023-02-12 MED ORDER — CALCITRIOL 0.25 MCG PO CAPS
2.2500 ug | ORAL_CAPSULE | ORAL | Status: DC
Start: 2023-02-15 — End: 2023-02-23
  Administered 2023-02-17 – 2023-02-22 (×3): 2.25 ug via ORAL
  Filled 2023-02-12 (×3): qty 9

## 2023-02-12 NOTE — Progress Notes (Signed)
Pt is transferred out of the floor for HD. He is stable hemodynamically, afebrile, no acute distress prior transferring.   Filiberto Pinks, RN

## 2023-02-12 NOTE — Progress Notes (Signed)
Received patient in bed to unit.  Alert and oriented.  Informed consent signed and in chart.   TX duration: 3 hours,  Patient tolerated well.  Transported back to the room  Alert, without acute distress.  Hand-off given to patient's nurse.   Access used: fistula  Access issues: none  Total UF removed: 0 Medication(s) given: none Post HD VS: 129/57 Post HD weight: unable to obtain    02/12/23 2328  Vitals  Temp 99.1 F (37.3 C)  Temp Source Oral  BP (!) 129/57  BP Location Right Arm  BP Method Automatic  Patient Position (if appropriate) Lying  Pulse Rate 77  Pulse Rate Source Monitor  Resp 13  Oxygen Therapy  SpO2 99 %  O2 Device Room Air  Patient Activity (if Appropriate) In bed  Pulse Oximetry Type Continuous  During Treatment Monitoring  Blood Flow Rate (mL/min) 350 mL/min  Arterial Pressure (mmHg) -183.63 mmHg  Venous Pressure (mmHg) 168.27 mmHg  TMP (mmHg) 0 mmHg  Ultrafiltration Rate (mL/min) 101 mL/min  Dialysate Flow Rate (mL/min) 299 ml/min  Dialysate Potassium Concentration 3  Dialysate Calcium Concentration 2.5  Duration of HD Treatment -hour(s) 3 hour(s)  Cumulative Fluid Removed (mL) per Treatment  0.02  HD Safety Checks Performed Yes  Intra-Hemodialysis Comments See progress note (post rinseback)  Post Treatment  Dialyzer Clearance Lightly streaked  Hemodialysis Intake (mL) 0 mL  Liters Processed 66.3  Fluid Removed (mL) 0 mL  Tolerated HD Treatment Yes  Post-Hemodialysis Comments HD completed, tolerated, no complaint.  pt is stable.  AVG/AVF Arterial Site Held (minutes) 10 minutes  AVG/AVF Venous Site Held (minutes) 15 minutes  Note  Patient Observations pt is in bed resting  Fistula / Graft Left Upper arm  No placement date or time found.   Orientation: Left  Access Location: Upper arm  Site Condition No complications  Fistula / Graft Assessment Present;Thrill;Bruit  Status Deaccessed  Drainage Description None

## 2023-02-12 NOTE — Plan of Care (Signed)
  Problem: Education: Goal: Knowledge of General Education information will improve Description: Including pain rating scale, medication(s)/side effects and non-pharmacologic comfort measures 02/12/2023 0532 by Charmian Muff, RN Outcome: Progressing 02/12/2023 0530 by Charmian Muff, RN Outcome: Progressing   Problem: Health Behavior/Discharge Planning: Goal: Ability to manage health-related needs will improve 02/12/2023 0532 by Charmian Muff, RN Outcome: Progressing 02/12/2023 0530 by Charmian Muff, RN Outcome: Progressing   Problem: Clinical Measurements: Goal: Ability to maintain clinical measurements within normal limits will improve Outcome: Progressing Goal: Will remain free from infection Outcome: Progressing

## 2023-02-12 NOTE — Assessment & Plan Note (Signed)
02-12-2023 his ICH while on plavix and Eliquis is reason he as taken off systemic anticoagulation and not started back on it.

## 2023-02-12 NOTE — Progress Notes (Signed)
[facility] Progress Note  Subjective: No c/o today.   Vitals:   02/12/23 0330 02/12/23 0758 02/12/23 1208 02/12/23 1620  BP: (!) 110/55 123/66 (!) 119/57 123/66  Pulse: (!) 47 (!) 54 62 (!) 113  Resp: 13 13 18 15   Temp: 98.3 F (36.8 C) 98.6 F (37 C) 98.7 F (37.1 C) 99.7 F (37.6 C)  TempSrc: Oral Oral Oral Oral  SpO2: 93% 98% 95% 97%  Weight:      Height:        Exam: Gen alert, no distress, chronically ill appearing Looks better No jvd or bruits Chest L clear, R dec'd at base RRR no RG Abd soft ntnd no mass or ascites +bs Ext sp recent R BKA , LLE no edema Neuro is alert nf     LUA AVF+bruit       Renal-related home meds: - norvasc, coreg, hydralazine - phoslo 2 ac tid      OP HD: [facility]  3.5h  bfr 400   85kg    2/2.5   LUA AVF   Heparin 1800 bolus + 400u/hr - rocaltrol 2.25 three times per week - sensipar 60mg  three times per week - mircera 90 mcg q 2     Assessment/ Plan: Diabetic foot infection - on IV abx, poss osteo per MRI. Angiogram showed small vessel disease. 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  ESKD - pt on TTS schedule. Had HD here Monday and Wed. HD today/ tonight to get back on schedule.  HTN - bp's good, cont home meds Volume - well under dry wt, no fluid off w/ next HD on Sat.  Anemia of eskd - Hb 8- 9 here. Follow.  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 Cont po vdra, sensipar and binders w/ meals.   Vinson Moselle MD  [facility] 02/12/2023, 7:30 PM  Recent Labs  Lab 02/09/23 0323 02/09/23 1420 02/10/23 0310  HGB 7.9* 9.9* 8.6*  ALBUMIN  --   --  2.0*  CALCIUM 8.7*  --  8.4*  CREATININE 9.40* 5.90* 6.73*  K 4.1 4.0 3.7   No results for input(s): "IRON", "TIBC", "FERRITIN" in the last 168 hours. Inpatient medications:  amiodarone  200 mg Oral Daily   amphetamine-dextroamphetamine  5 mg Oral Daily   vitamin C  1,000 mg Oral Daily   aspirin EC  81 mg Oral Daily   atorvastatin  80 mg Oral QHS   [START ON 02/15/2023] calcitRIOL  2.25  mcg Oral Q T,Th,Sat-1800   calcium acetate  1,334 mg Oral TID WC   carvedilol  12.5 mg Oral BID   Chlorhexidine Gluconate Cloth  6 each Topical Q0600   [START ON 02/15/2023] cinacalcet  60 mg Oral Q T,Th,Sat-1800   dorzolamide-timolol  1 drop Right Eye BID   [START ON 02/13/2023] heparin  2,000 Units Dialysis Once in dialysis   heparin sodium (porcine)  1,500 Units Intravenous Once   levETIRAcetam  500 mg Oral Once per day on Sunday Monday Wednesday Friday   levETIRAcetam  750 mg Oral Once per day on Tuesday Thursday Saturday   mupirocin ointment  1 Application Nasal BID   nutrition supplement (JUVEN)  1 packet Oral BID BM   nystatin   Topical TID   sodium chloride flush  3 mL Intravenous Q12H   sodium chloride flush  3 mL Intravenous Q12H   sodium chloride flush  3-10 mL Intravenous Q12H   zinc sulfate (50mg  elemental zinc)  220 mg Oral  Daily    anticoagulant sodium citrate      acetaminophen **OR** acetaminophen, alteplase, anticoagulant sodium citrate, camphor-menthol, feeding supplement (NEPRO CARB STEADY), Gerhardt's butt cream, heparin, [START ON 02/13/2023] heparin, hydrALAZINE, HYDROcodone-acetaminophen, HYDROmorphone (DILAUDID) injection, labetalol, lidocaine (PF), lidocaine, lidocaine-prilocaine, ondansetron (ZOFRAN) IV, oxyCODONE, pentafluoroprop-tetrafluoroeth, sodium chloride flush, sodium chloride flush

## 2023-02-12 NOTE — Progress Notes (Signed)
Patient ID: [name] Barns., male   DOB: Jul 07, 1970, 52 y.o.   MRN: [id number] Patient is postoperative day 3 transtibial amputation on the right.  The wound VAC has no drainage there is a good suction fit.  Discussed the importance of protein supplements with the Juven.  Anticipate discharge to skilled nursing.  Will discontinue the wound VAC at time of discharge.

## 2023-02-12 NOTE — TOC Progression Note (Signed)
Transition of Care (TOC) - Progression Note    Patient Details  Name: [name]. MRN: [id number] Date of Birth: 18-Sep-1970  Transition of Care [facility]) CM/SW Contact  Patrice Paradise, LCSW Phone Number: 02/12/2023, 12:10 PM  Clinical Narrative:     CSW started pt's auth with a start date for 02/14/2023. Reference # [id number].  TOC team will continue to assist with discharge planning needs.     Barriers to Discharge: Continued Medical Work up  Expected Discharge Plan and Services In-house Referral: Clinical Social Work     Living arrangements for the past 2 months: Single Family Home                                       Social Determinants of Health (SDOH) Interventions SDOH Screenings   Food Insecurity: Patient Declined (02/04/2023)  Housing: Patient Declined (02/04/2023)  Transportation Needs: Patient Declined (02/04/2023)  Utilities: Patient Declined (02/04/2023)  Financial Resource Strain: High Risk (07/05/2017)  Physical Activity: Inactive (07/05/2017)  Social Connections: Moderately Integrated (07/05/2017)  Stress: Stress Concern Present (07/05/2017)  Tobacco Use: Low Risk  (02/09/2023)  Health Literacy: Low Risk  (06/01/2020)   Received from [facility]    Readmission Risk Interventions    07/27/2021   11:49 AM 11/27/2020    3:06 PM  Readmission Risk Prevention Plan  Transportation Screening Complete Complete  PCP or Specialist Appt within 3-5 Days  Complete  HRI or Home Care Consult  Complete  Social Work Consult for Recovery Care Planning/Counseling  Complete  Palliative Care Screening  Not Applicable  Medication Review Oceanographer) Complete Complete  SW Recovery Care/Counseling Consult Complete   Palliative Care Screening Not Applicable   Skilled Nursing Facility Not Applicable

## 2023-02-12 NOTE — Plan of Care (Signed)

## 2023-02-12 NOTE — Progress Notes (Signed)
PROGRESS NOTE    [name].  LKG:[id number] DOB: 1970/04/30 DOA: 02/03/2023 PCP: [name], FNP  Subjective: Pt seen and examined.  Met with pt and wife at bedside Discussed pt's need to increase his protein intake if he wants his R BKA stump to heal. Examined his left foot. No ulceration or signs of infection.  Wife thinks she wants [facility]   Hospital Course: HPI: [name] Amanti Tio. is a 52 y.o. male with medical history significant of type 2 diabetes mellitus, acute osteomyelitis of toe of right foot with gangrene status post amputation of the great toe on the right foot, paroxysmal atrial fibrillation not on anticoagulation, hypertension, ESRD on HD, seizure disorder, BPH, diabetic polyneuropathy, GERD, gastroparesis, hypertension was sent to ED from podiatry office.  Per patient and his wife who is at the bedside, patient's wife noted a wound on his right foot on Sunday.  He called his podiatrist/Dr. Wagoner and was given an appointment to see today.  Being seen in the podiatry office today, he was noted to have significant pus and infected wound, he was directed to the emergency department for further workup and admission.  Patient denies any complaint, no pain.  Per wife, patient typically does not notice things himself until she does.  No reports of fever, chills, sweating, nausea, vomiting any other complaint or any foot trauma, any recent travel or sick contact.   ED Course: Hemodynamically stable upon arrival.  X-ray right foot unremarkable.  MRI ordered and pending.  Patient received cefepime, Flagyl and vancomycin in the ED and nephrology was consulted for dialysis and hospitalist were consulted for admission.  Significant Events: Admitted 02/03/2023 for diabetic right foot ulcer with concern for acute osteomyelitis   Significant Labs: Admission WBC 22.3, Bicarb 13, BUN 122, Scr 18.57  Significant Imaging Studies: MRI Right foot Substantial  fluid/edema tracking within along the quadratus plantae, abductor hallucis, flexor digitorum brevis, third and fourth plantar and dorsal interosseous, and abductor digiti minimi muscles, confluent with subcutaneous edema which is primarily infiltrative but which may have small fluid pockets tracking dorsal lateral to the fifth metatarsal, dorsal to the fourth metatarsal and into the fourth toe, and in the plantar subcutaneous tissues below the distal fourth and fifth metatarsals. Appearance concerning for active infection. 2. Cortical irregularity and mild marrow edema in the head of the fifth metatarsal suspicious for early osteomyelitis. 3. Previous small toe amputation at the MTP joint.  Antibiotic Therapy: Anti-infectives (From admission, onward)    Start     Dose/Rate Route Frequency Ordered Stop   02/08/23 1800  vancomycin (VANCOREADY) IVPB 750 mg/150 mL  Status:  Discontinued        750 mg 150 mL/hr over 60 Minutes Intravenous Every T-Th-Sa (Hemodialysis) 02/04/23 2042 02/07/23 1436   02/07/23 1530  vancomycin (VANCOREADY) IVPB 750 mg/150 mL        750 mg 150 mL/hr over 60 Minutes Intravenous Every M-W-F (Hemodialysis) 02/07/23 1436     02/05/23 0100  vancomycin (VANCOREADY) IVPB 750 mg/150 mL        750 mg 150 mL/hr over 60 Minutes Intravenous  Once 02/04/23 2042 02/05/23 0509   02/04/23 1800  ceFEPIme (MAXIPIME) 1 g in sodium chloride 0.9 % 100 mL IVPB        1 g 200 mL/hr over 30 Minutes Intravenous Every 24 hours 02/03/23 1401     12 /12/24 1330  vancomycin (VANCOCIN) IVPB 1000 mg/200 mL premix       Placed  in "Followed by" Linked Group   1,000 mg 200 mL/hr over 60 Minutes Intravenous  Once 02/03/23 1224 02/03/23 1513   02/03/23 1230  metroNIDAZOLE (FLAGYL) IVPB 500 mg        500 mg 100 mL/hr over 60 Minutes Intravenous  Once 02/03/23 1221 02/03/23 1402   02/03/23 1230  ceFEPIme (MAXIPIME) 2 g in sodium chloride 0.9 % 100 mL IVPB        2 g 200 mL/hr over 30 Minutes Intravenous   Once 02/03/23 1224 02/03/23 1352   02/03/23 1230  vancomycin (VANCOCIN) IVPB 1000 mg/200 mL premix       Placed in "Followed by" Linked Group   1,000 mg 200 mL/hr over 60 Minutes Intravenous  Once 02/03/23 1224 02/03/23 1412       Procedures:   Consultants: Nephrology Vascular surgery orthopedics    Assessment and Plan: * Gangrene of right foot (HCC) On admission to 02-06-2023. Pt has osteomyelitis of right foot. Pt taken to OR on 02-04-2023 by podiatry. Pt had Partial fourth ray amputation, right foot.  Further resection of fifth metatarsal bone, right foot. [name] with orthopedics saw the patient and felt that pt had gangrene of the foot.  02-07-2023 pt will need right BKA by ortho. Planned on Wednesday Feb 09, 2023. 02-08-2023 angiogram today to see if he will need left lower extremity surgery. 02-10-2023 s/p right BKA on 02-09-2023. Gangrene has been surgically cured.  S/P BKA (below knee amputation), right [facility]) - 02-09-2023 02-09-2023 s/p right BKA. Management per ortho.  02-10-2023 family has decided on SNF at discharge. CM aware.  02-11-2023 awaiting SNF plans  02-12-2023 wife wants [facility].  PAD (peripheral artery disease) (HCC) 02-07-2023 pt noted to hypoglycemic today. Angiogram canceled.  Angiogram to make sure that he doesn't need left BKA at the same time. 02-08-2023 angiogram today to see if he will need left lower extremity surgery.   02-09-2023 angiogram on 02-08-2023 shows blood flow to left foot. "Inline flow to the foot with three-vessel runoff bilaterally but with microvascular disease and minimal filling pedal vessels bilaterally."  02-10-2023 remains on lipitor. Will need as least ASA 81 mg daily at discharge 02-11-2023 start ASA 81 mg daily. 02-12-2023 continue ASA 81 mg daily.  History of seizure 02-11-2023 change over to po keppra. Was on IV keppra due to NPO status. Will have pharmacy dose to match his regular outpatient  schedule. 02-12-2023 pt gets 500 mg po daily keppra. He gets an extra 250 mg of keppra on his HD days. So a total of 750 of keppra on HD days and only 500 mg of Keppra on NON-HD days.  History of CVA (cerebrovascular accident) - with ICH while on plavix and Eliquis in 11-2021. 02-11-2023 Chronic. Will convert back to po keppra.  H/O spontaneous intraventricular intracranial hemorrhage 10/2021 - while on plavix and Eliquis 02-12-2023 his ICH while on plavix and Eliquis is reason he as taken off systemic anticoagulation and not started back on it.  Coronary artery disease 02-07-2023 stable. 02-11-2023 restart ASA 81 mg daily. On lipitor 80 mg at bedtime. On coreg 12.5 mg bid  ESRD on hemodialysis (HCC) 02-07-2023 thru 02-19-2023 continue with HD per nephrology 02-10-2023 on HD 02-12-2023 continue with HD  Anemia due to chronic kidney disease, on chronic dialysis (HCC) 02-07-2023 stable. 02-10-2023  Hgb 8.6  Paroxysmal atrial fibrillation (HCC) - not on systemic anticoagulation due to ICH while on Eliquis in 11-2021. 02-07-2023 continue with amiodarone 200 mg qday 02-08-2023 off  systemic anticoagulation in preparation BKA  02-09-2023 will need to ask ortho when systemic anticoagulation can be resumed after his right BKA. 02-10-2023 have asked ortho when eliquis can be restarted 02-11-2023 Continue amiodarone 200 mg qday. Upon further review of his old EMR and cardiology records. Pt was taken off systemic anticoagulation by neurology after his intracerebral hemorrhage in 11-2021.  Pt was also on plavix at that time. This was also stopped.  However, given he has PVD and it at risk of losing his left LE to amputation, I think that low dose ASA is the best that can be done to balance his PVD, CVA risk and minimize bleeding.  02-12-2023 continue with ASA 81 mg daily.  Uncontrolled type 2 diabetes mellitus with hyperglycemia, with long-term current use of insulin (HCC) 02-07-2023 CBG low  today. Pt confused. Will give 1/2 amp D50 IV. Hypoglycemia may be due to pt being NPO for his angiogram today. 02-08-2023 diabetic hypoglycemia improved with IVF with dextrose. 02-09-2023 continue D5 LR @ 25 ml/hr until po intake back to normal. 02-10-2023 pt advised he need to continue to eat 3 times a day, even if it is just a snack. 02-12-2023 continue encourage pt that he needs to continue to eat TID and have lots of protein in his diet to promote wound healing.  Essential hypertension, benign 02-07-2023 continue coreg 12.5 mg bid. 02-08-2023 stable. Continue coreg 02-09-2023 stable. 02-10-2023 wife states pt has a cough from HTN meds. Pt on coreg for BP and afib. Changing to toprol-xl will also likely give him cough. Wife agreeable to staying on coreg.  02-11-2023 hold parameters placed on coreg. Hold for SBP <120 or HR <60  02-12-2023 occ bradycardia with HR in the 50s   DVT prophylaxis: SCD's Start: 02/09/23 1743 Place and maintain sequential compression device Start: 02/05/23 1515    Code Status: Full Code Family Communication: spoke with wife at bedside. She has decided on [facility]. I have notified weekend CM of wife's choice. Disposition Plan: SNF Reason for continuing need for hospitalization: medically stable for DC  Objective: Vitals:   02/11/23 2000 02/11/23 2305 02/12/23 0330 02/12/23 0758  BP: 118/63 115/61 (!) 110/55 123/66  Pulse: (!) 55 (!) 58 (!) 47 (!) 54  Resp: 15 14 13 13   Temp: 98.4 F (36.9 C) 98.4 F (36.9 C) 98.3 F (36.8 C) 98.6 F (37 C)  TempSrc: Oral Oral Oral Oral  SpO2: 96% 99% 93% 98%  Weight:      Height:        Intake/Output Summary (Last 24 hours) at 02/12/2023 0924 Last data filed at 02/11/2023 1710 Gross per 24 hour  Intake 480 ml  Output --  Net 480 ml   Filed Weights   02/09/23 1334 02/10/23 0431 02/11/23 0500  Weight: 80.5 kg 80 kg 78.4 kg    Examination:  Physical Exam Vitals and nursing note reviewed.   Constitutional:      General: He is not in acute distress.    Appearance: He is not toxic-appearing or diaphoretic.  HENT:     Head: Normocephalic and atraumatic.     Nose: Nose normal.  Eyes:     Comments: Missing left eye  Cardiovascular:     Rate and Rhythm: Normal rate and regular rhythm.  Pulmonary:     Effort: Pulmonary effort is normal.  Abdominal:     General: Abdomen is flat.  Musculoskeletal:     Comments: Right BKA  Skin:    General: Skin  is warm and dry.     Capillary Refill: Capillary refill takes less than 2 seconds.     Comments: Left foot examined. He has a callus on his left heel but no ulceration. No signs of any infection of left foot.  Neurological:     Mental Status: He is alert and oriented to person, place, and time.     Data Reviewed: I have personally reviewed following labs and imaging studies  CBC: Recent Labs  Lab 02/07/23 0318 02/09/23 0323 02/09/23 1420 02/10/23 0310  WBC 17.9* 16.1*  --  12.8*  NEUTROABS  --   --   --  11.4*  HGB 8.1* 7.9* 9.9* 8.6*  HCT 25.4* 25.5* 29.0* 28.8*  MCV 70.8* 72.6*  --  74.8*  PLT 224 219  --  194   Basic Metabolic Panel: Recent Labs  Lab 02/07/23 0318 02/09/23 0323 02/09/23 1420 02/10/23 0310  NA 135 136 134* 134*  K 4.0 4.1 4.0 3.7  CL 101 97* 97* 98  CO2 19* 24  --  25  GLUCOSE 103* 98 78 149*  BUN 105* 72* 38* 45*  CREATININE 14.30* 9.40* 5.90* 6.73*  CALCIUM 8.7* 8.7*  --  8.4*   GFR: Estimated Creatinine Clearance: 13.9 mL/min (A) (by C-G formula based on SCr of 6.73 mg/dL (H)). Liver Function Tests: Recent Labs  Lab 02/10/23 0310  AST 10*  ALT 7  ALKPHOS 67  BILITOT 0.5  PROT 6.3*  ALBUMIN 2.0*   CBG: Recent Labs  Lab 02/11/23 1602 02/11/23 1958 02/11/23 2341 02/12/23 0351 02/12/23 0757  GLUCAP 98 84 78 73 76    Recent Results (from the past 240 hours)  Culture, blood (routine x 2)     Status: None   Collection Time: 02/03/23 12:22 PM   Specimen: BLOOD RIGHT ARM   Result Value Ref Range Status   Specimen Description BLOOD RIGHT ARM  Final   Special Requests   Final    BOTTLES DRAWN AEROBIC AND ANAEROBIC Blood Culture results may not be optimal due to an inadequate volume of blood received in culture bottles   Culture   Final    NO GROWTH 5 DAYS Performed at [facility], [address], [place], [place] [address]    Report Status 02/08/2023 FINAL  Final  Culture, blood (routine x 2)     Status: None   Collection Time: 02/03/23 12:27 PM   Specimen: BLOOD RIGHT HAND  Result Value Ref Range Status   Specimen Description BLOOD RIGHT HAND  Final   Special Requests   Final    BOTTLES DRAWN AEROBIC AND ANAEROBIC Blood Culture results may not be optimal due to an inadequate volume of blood received in culture bottles   Culture   Final    NO GROWTH 5 DAYS Performed at [facility], [address], [place], [place] [address]    Report Status 02/08/2023 FINAL  Final  Aerobic/Anaerobic Culture w Gram Stain (surgical/deep wound)     Status: None   Collection Time: 02/04/23 12:06 PM   Specimen: Foot, Right; Tissue  Result Value Ref Range Status   Specimen Description TISSUE  Final   Special Requests RIGHT FOOT  Final   Gram Stain   Final    FEW WBC PRESENT, PREDOMINANTLY PMN ABUNDANT GRAM POSITIVE COCCI IN PAIRS RARE GRAM NEGATIVE COCCOBACILLI    Culture   Final    ABUNDANT STAPHYLOCOCCUS AUREUS MODERATE CORYNEBACTERIUM STRIATUM Standardized susceptibility testing for this organism  is not available. NO ANAEROBES ISOLATED Performed at [facility], [address], [place], [place] [address]    Report Status 02/09/2023 FINAL  Final   Organism ID, Bacteria STAPHYLOCOCCUS AUREUS  Final      Susceptibility   Staphylococcus aureus - MIC*    CIPROFLOXACIN <=0.5 SENSITIVE Sensitive     ERYTHROMYCIN RESISTANT Resistant     GENTAMICIN <=0.5 SENSITIVE Sensitive     OXACILLIN <=0.25 SENSITIVE Sensitive     TETRACYCLINE <=1 SENSITIVE  Sensitive     VANCOMYCIN 1 SENSITIVE Sensitive     TRIMETH/SULFA <=10 SENSITIVE Sensitive     CLINDAMYCIN RESISTANT Resistant     RIFAMPIN <=0.5 SENSITIVE Sensitive     Inducible Clindamycin POSITIVE Resistant     LINEZOLID 2 SENSITIVE Sensitive     * ABUNDANT STAPHYLOCOCCUS AUREUS  Aerobic/Anaerobic Culture w Gram Stain (surgical/deep wound)     Status: None   Collection Time: 02/04/23 12:06 PM   Specimen: Foot, Right; Wound  Result Value Ref Range Status   Specimen Description WOUND  Final   Special Requests RIGHT FOOT  Final   Gram Stain   Final    MODERATE WBC PRESENT, PREDOMINANTLY PMN MODERATE GRAM POSITIVE COCCI    Culture   Final    ABUNDANT STAPHYLOCOCCUS AUREUS SUSCEPTIBILITIES PERFORMED ON PREVIOUS CULTURE WITHIN THE LAST 5 DAYS. MODERATE CORYNEBACTERIUM STRIATUM Standardized susceptibility testing for this organism is not available. NO ANAEROBES ISOLATED Performed at [facility], [address], [place], [place] [address]    Report Status 02/09/2023 FINAL  Final  Surgical pcr screen     Status: Abnormal   Collection Time: 02/08/23  8:05 PM   Specimen: Nasal Mucosa; Nasal Swab  Result Value Ref Range Status   MRSA, PCR NEGATIVE NEGATIVE Final   Staphylococcus aureus POSITIVE (A) NEGATIVE Final    Comment: (NOTE) The Xpert SA Assay (FDA approved for NASAL specimens in patients 58 years of age and older), is one component of a comprehensive surveillance program. It is not intended to diagnose infection nor to guide or monitor treatment. Performed at [facility], [address] [place], [place] [address]      Radiology Studies: No results found.  Scheduled Meds:  amiodarone  200 mg Oral Daily   amphetamine-dextroamphetamine  5 mg Oral Daily   vitamin C  1,000 mg Oral Daily   aspirin EC  81 mg Oral Daily   atorvastatin  80 mg Oral QHS   calcium acetate  1,334 mg Oral TID WC   carvedilol  12.5 mg Oral BID   Chlorhexidine Gluconate Cloth  6  each Topical Q0600   dorzolamide-timolol  1 drop Right Eye BID   levETIRAcetam  500 mg Oral Once per day on Sunday Monday Wednesday Friday   levETIRAcetam  750 mg Oral Once per day on Tuesday Thursday Saturday   mupirocin ointment  1 Application Nasal BID   nutrition supplement (JUVEN)  1 packet Oral BID BM   nystatin   Topical TID   sodium chloride flush  3 mL Intravenous Q12H   sodium chloride flush  3 mL Intravenous Q12H   sodium chloride flush  3-10 mL Intravenous Q12H   zinc sulfate (50mg  elemental zinc)  220 mg Oral Daily   Continuous Infusions:   LOS: 9 days   Time spent: 40 minutes  Carollee Herter, DO  [facility]  02/12/2023, 9:24 AM

## 2023-02-12 NOTE — TOC Progression Note (Signed)
Transition of Care (TOC) - Progression Note    Patient Details  Name: [name]. MRN: [id number] Date of Birth: Jul 10, 1970  Transition of Care [facility]) CM/SW Contact  Patrice Paradise, LCSW Phone Number: 02/12/2023, 9:58 AM  Clinical Narrative:     Per MD and RN [facility]. CSW attempted to follow up Bjorn Loser at [facility] and she confirmed that they have bed available however would need to get HD switch to a [place] location. She stated that could not do a chair time of 6am on TTHSA. CSW will start pt's auth.\\  TOC team will continue to assist with discharge planning needs.   TOC team will continue to assist with discharge planning needs.    Barriers to Discharge: Continued Medical Work up  Expected Discharge Plan and Services In-house Referral: Clinical Social Work     Living arrangements for the past 2 months: Single Family Home                                       Social Determinants of Health (SDOH) Interventions SDOH Screenings   Food Insecurity: Patient Declined (02/04/2023)  Housing: Patient Declined (02/04/2023)  Transportation Needs: Patient Declined (02/04/2023)  Utilities: Patient Declined (02/04/2023)  Financial Resource Strain: High Risk (07/05/2017)  Physical Activity: Inactive (07/05/2017)  Social Connections: Moderately Integrated (07/05/2017)  Stress: Stress Concern Present (07/05/2017)  Tobacco Use: Low Risk  (02/09/2023)  Health Literacy: Low Risk  (06/01/2020)   Received from [facility], [facility]    Readmission Risk Interventions    07/27/2021   11:49 AM 11/27/2020    3:06 PM  Readmission Risk Prevention Plan  Transportation Screening Complete Complete  PCP or Specialist Appt within 3-5 Days  Complete  HRI or Home Care Consult  Complete  Social Work Consult for Recovery Care Planning/Counseling  Complete  Palliative Care Screening  Not Applicable  Medication Review Oceanographer) Complete  Complete  SW Recovery Care/Counseling Consult Complete   Palliative Care Screening Not Applicable   Skilled Nursing Facility Not Applicable

## 2023-02-13 DIAGNOSIS — E11649 Type 2 diabetes mellitus with hypoglycemia without coma: Secondary | ICD-10-CM | POA: Diagnosis not present

## 2023-02-13 DIAGNOSIS — D631 Anemia in chronic kidney disease: Secondary | ICD-10-CM | POA: Diagnosis not present

## 2023-02-13 DIAGNOSIS — N186 End stage renal disease: Secondary | ICD-10-CM | POA: Diagnosis not present

## 2023-02-13 DIAGNOSIS — Z89511 Acquired absence of right leg below knee: Secondary | ICD-10-CM | POA: Diagnosis not present

## 2023-02-13 LAB — CBC WITH DIFFERENTIAL/PLATELET
Abs Immature Granulocytes: 0.32 10*3/uL — ABNORMAL HIGH (ref 0.00–0.07)
Basophils Absolute: 0.1 10*3/uL (ref 0.0–0.1)
Basophils Relative: 0 %
Eosinophils Absolute: 0.3 10*3/uL (ref 0.0–0.5)
Eosinophils Relative: 1 %
HCT: 26.2 % — ABNORMAL LOW (ref 39.0–52.0)
Hemoglobin: 8.1 g/dL — ABNORMAL LOW (ref 13.0–17.0)
Immature Granulocytes: 2 %
Lymphocytes Relative: 7 %
Lymphs Abs: 1.2 10*3/uL (ref 0.7–4.0)
MCH: 22.9 pg — ABNORMAL LOW (ref 26.0–34.0)
MCHC: 30.9 g/dL (ref 30.0–36.0)
MCV: 74.2 fL — ABNORMAL LOW (ref 80.0–100.0)
Monocytes Absolute: 1.2 10*3/uL — ABNORMAL HIGH (ref 0.1–1.0)
Monocytes Relative: 7 %
Neutro Abs: 15.3 10*3/uL — ABNORMAL HIGH (ref 1.7–7.7)
Neutrophils Relative %: 83 %
Platelets: 221 10*3/uL (ref 150–400)
RBC: 3.53 MIL/uL — ABNORMAL LOW (ref 4.22–5.81)
RDW: 20.8 % — ABNORMAL HIGH (ref 11.5–15.5)
WBC: 18.4 10*3/uL — ABNORMAL HIGH (ref 4.0–10.5)
nRBC: 0 % (ref 0.0–0.2)

## 2023-02-13 LAB — COMPREHENSIVE METABOLIC PANEL
ALT: 6 U/L (ref 0–44)
AST: 15 U/L (ref 15–41)
Albumin: 1.9 g/dL — ABNORMAL LOW (ref 3.5–5.0)
Alkaline Phosphatase: 64 U/L (ref 38–126)
Anion gap: 14 (ref 5–15)
BUN: 45 mg/dL — ABNORMAL HIGH (ref 6–20)
CO2: 25 mmol/L (ref 22–32)
Calcium: 8.5 mg/dL — ABNORMAL LOW (ref 8.9–10.3)
Chloride: 97 mmol/L — ABNORMAL LOW (ref 98–111)
Creatinine, Ser: 7.25 mg/dL — ABNORMAL HIGH (ref 0.61–1.24)
GFR, Estimated: 8 mL/min — ABNORMAL LOW (ref >60)
Glucose, Bld: 90 mg/dL (ref 70–99)
Potassium: 3.6 mmol/L (ref 3.5–5.1)
Sodium: 136 mmol/L (ref 135–145)
Total Bilirubin: 0.6 mg/dL (ref <1.2)
Total Protein: 5.5 g/dL — ABNORMAL LOW (ref 6.5–8.1)

## 2023-02-13 LAB — GLUCOSE, CAPILLARY
Glucose-Capillary: 59 mg/dL — ABNORMAL LOW (ref 70–99)
Glucose-Capillary: 87 mg/dL (ref 70–99)
Glucose-Capillary: 80 mg/dL (ref 70–99)
Glucose-Capillary: 74 mg/dL (ref 70–99)
Glucose-Capillary: 91 mg/dL (ref 70–99)
Glucose-Capillary: 180 mg/dL — ABNORMAL HIGH (ref 70–99)

## 2023-02-13 MED ORDER — CHLORHEXIDINE GLUCONATE CLOTH 2 % EX PADS
6.0000 | MEDICATED_PAD | Freq: Every day | CUTANEOUS | Status: DC
  Administered 2023-02-14: 6 via TOPICAL

## 2023-02-13 MED ORDER — DEXTROSE 50 % IV SOLN: 12.5000 g | INTRAVENOUS | Status: DC | PRN

## 2023-02-13 MED ORDER — DEXTROSE 50 % IV SOLN: 12.5000 g | INTRAVENOUS | Status: DC

## 2023-02-13 NOTE — Progress Notes (Signed)
This nurse addressed by phlebotomist, stating that patient has refused lab draw again this morning.  Patient has refused last 2 lab draws.  Care will continue.

## 2023-02-13 NOTE — TOC Progression Note (Signed)
Transition of Care (TOC) - Progression Note    Patient Details  Name: [name]. MRN: [id number] Date of Birth: February 04, 1971  Transition of Care [facility]) CM/SW Contact  Lillybeth Tal A [place], [place] Phone Number: 02/13/2023, 9:00 AM  Clinical Narrative:     [name] insurance authorization is still pending.    TOC will continue to follow.     Barriers to Discharge: Continued Medical Work up  Expected Discharge Plan and Services In-house Referral: Clinical Social Work     Living arrangements for the past 2 months: Single Family Home                                       Social Determinants of Health (SDOH) Interventions SDOH Screenings   Food Insecurity: Patient Declined (02/04/2023)  Housing: Patient Declined (02/04/2023)  Transportation Needs: Patient Declined (02/04/2023)  Utilities: Patient Declined (02/04/2023)  Financial Resource Strain: High Risk (07/05/2017)  Physical Activity: Inactive (07/05/2017)  Social Connections: Moderately Integrated (07/05/2017)  Stress: Stress Concern Present (07/05/2017)  Tobacco Use: Low Risk  (02/09/2023)  Health Literacy: Low Risk  (06/01/2020)   Received from [facility]    Readmission Risk Interventions    07/27/2021   11:49 AM 11/27/2020    3:06 PM  Readmission Risk Prevention Plan  Transportation Screening Complete Complete  PCP or Specialist Appt within 3-5 Days  Complete  HRI or Home Care Consult  Complete  Social Work Consult for Recovery Care Planning/Counseling  Complete  Palliative Care Screening  Not Applicable  Medication Review Oceanographer) Complete Complete  SW Recovery Care/Counseling Consult Complete   Palliative Care Screening Not Applicable   Skilled Nursing Facility Not Applicable

## 2023-02-13 NOTE — Assessment & Plan Note (Addendum)
02-13-2023 pt not on any diabetic meds. I think his hypoglycemia is more related to lack of adequate PO intake. Advised pt that he need to eat food even if he does feel hungry or he doesn't like the taste of his food.

## 2023-02-13 NOTE — Plan of Care (Signed)
  Problem: Activity: Goal: Risk for activity intolerance will decrease Outcome: Not Progressing   Problem: Nutrition: Goal: Adequate nutrition will be maintained Outcome: Not Progressing   Problem: Skin Integrity: Goal: Risk for impaired skin integrity will decrease Outcome: Not Progressing   

## 2023-02-13 NOTE — Progress Notes (Signed)
PROGRESS NOTE    [name].  VHQ:[id number] DOB: May 16, 1970 DOA: 02/03/2023 PCP: [name], FNP  Subjective: Pt seen and examined.  Wife not at bedside this AM. Pt had another hypoglycemic episode this AM(0537) with CBG 57. Pt eating very little. States the food doesn't taste good.   Hospital Course: HPI: [name]. is a 52 y.o. male with medical history significant of type 2 diabetes mellitus, acute osteomyelitis of toe of right foot with gangrene status post amputation of the great toe on the right foot, paroxysmal atrial fibrillation not on anticoagulation, hypertension, ESRD on HD, seizure disorder, BPH, diabetic polyneuropathy, GERD, gastroparesis, hypertension was sent to ED from podiatry office.  Per patient and his wife who is at the bedside, patient's wife noted a wound on his right foot on Sunday.  He called his podiatrist/Dr. Wagoner and was given an appointment to see today.  Being seen in the podiatry office today, he was noted to have significant pus and infected wound, he was directed to the emergency department for further workup and admission.  Patient denies any complaint, no pain.  Per wife, patient typically does not notice things himself until she does.  No reports of fever, chills, sweating, nausea, vomiting any other complaint or any foot trauma, any recent travel or sick contact.   ED Course: Hemodynamically stable upon arrival.  X-ray right foot unremarkable.  MRI ordered and pending.  Patient received cefepime, Flagyl and vancomycin in the ED and nephrology was consulted for dialysis and hospitalist were consulted for admission.  Significant Events: Admitted 02/03/2023 for diabetic right foot ulcer with concern for acute osteomyelitis   Significant Labs: Admission WBC 22.3, Bicarb 13, BUN 122, Scr 18.57  Significant Imaging Studies: MRI Right foot Substantial fluid/edema tracking within along the quadratus plantae, abductor hallucis,  flexor digitorum brevis, third and fourth plantar and dorsal interosseous, and abductor digiti minimi muscles, confluent with subcutaneous edema which is primarily infiltrative but which may have small fluid pockets tracking dorsal lateral to the fifth metatarsal, dorsal to the fourth metatarsal and into the fourth toe, and in the plantar subcutaneous tissues below the distal fourth and fifth metatarsals. Appearance concerning for active infection. 2. Cortical irregularity and mild marrow edema in the head of the fifth metatarsal suspicious for early osteomyelitis. 3. Previous small toe amputation at the MTP joint.  Antibiotic Therapy: Anti-infectives (From admission, onward)    Start     Dose/Rate Route Frequency Ordered Stop   02/08/23 1800  vancomycin (VANCOREADY) IVPB 750 mg/150 mL  Status:  Discontinued        750 mg 150 mL/hr over 60 Minutes Intravenous Every T-Th-Sa (Hemodialysis) 02/04/23 2042 02/07/23 1436   02/07/23 1530  vancomycin (VANCOREADY) IVPB 750 mg/150 mL        750 mg 150 mL/hr over 60 Minutes Intravenous Every M-W-F (Hemodialysis) 02/07/23 1436     02/05/23 0100  vancomycin (VANCOREADY) IVPB 750 mg/150 mL        750 mg 150 mL/hr over 60 Minutes Intravenous  Once 02/04/23 2042 02/05/23 0509   02/04/23 1800  ceFEPIme (MAXIPIME) 1 g in sodium chloride 0.9 % 100 mL IVPB        1 g 200 mL/hr over 30 Minutes Intravenous Every 24 hours 02/03/23 1401     12 /12/24 1330  vancomycin (VANCOCIN) IVPB 1000 mg/200 mL premix       Placed in "Followed by" Linked Group   1,000 mg 200 mL/hr over 60 Minutes Intravenous  Once 02/03/23 1224 02/03/23 1513   02/03/23 1230  metroNIDAZOLE (FLAGYL) IVPB 500 mg        500 mg 100 mL/hr over 60 Minutes Intravenous  Once 02/03/23 1221 02/03/23 1402   02/03/23 1230  ceFEPIme (MAXIPIME) 2 g in sodium chloride 0.9 % 100 mL IVPB        2 g 200 mL/hr over 30 Minutes Intravenous  Once 02/03/23 1224 02/03/23 1352   02/03/23 1230  vancomycin (VANCOCIN)  IVPB 1000 mg/200 mL premix       Placed in "Followed by" Linked Group   1,000 mg 200 mL/hr over 60 Minutes Intravenous  Once 02/03/23 1224 02/03/23 1412       Procedures:   Consultants: Nephrology Vascular surgery orthopedics    Assessment and Plan: * Gangrene of right foot (HCC) On admission to 02-06-2023. Pt has osteomyelitis of right foot. Pt taken to OR on 02-04-2023 by podiatry. Pt had Partial fourth ray amputation, right foot.  Further resection of fifth metatarsal bone, right foot. [name] with orthopedics saw the patient and felt that pt had gangrene of the foot.  02-07-2023 pt will need right BKA by ortho. Planned on Wednesday Feb 09, 2023. 02-08-2023 angiogram today to see if he will need left lower extremity surgery. 02-10-2023 s/p right BKA on 02-09-2023. Gangrene has been surgically cured.  Diabetic hypoglycemia (HCC) 02-13-2023 pt not on any diabetic meds. I think his hypoglycemia is more related to lack of adequate PO intake. Advised pt that he need to eat food even if he does feel hungry or he doesn't like the taste of his food.  S/P BKA (below knee amputation), right [facility]) - 02-09-2023 02-09-2023 s/p right BKA. Management per ortho.  02-10-2023 family has decided on SNF at discharge. CM aware.  02-11-2023 awaiting SNF plans  02-12-2023 wife wants [facility] SNF. 02-13-2023 awaiting on CM.  PAD (peripheral artery disease) (HCC) 02-07-2023 pt noted to hypoglycemic today. Angiogram canceled.  Angiogram to make sure that he doesn't need left BKA at the same time. 02-08-2023 angiogram today to see if he will need left lower extremity surgery.   02-09-2023 angiogram on 02-08-2023 shows blood flow to left foot. "Inline flow to the foot with three-vessel runoff bilaterally but with microvascular disease and minimal filling pedal vessels bilaterally."  02-10-2023 remains on lipitor. Will need as least ASA 81 mg daily at discharge 02-11-2023 start ASA 81 mg  daily. 02-12-2023, 02-13-2023 continue ASA 81 mg daily.  History of seizure 02-11-2023 change over to po keppra. Was on IV keppra due to NPO status. Will have pharmacy dose to match his regular outpatient schedule. 02-12-2023 pt gets 500 mg po daily keppra. He gets an extra 250 mg of keppra on his HD days. So a total of 750 of keppra on HD days and only 500 mg of Keppra on NON-HD days.  History of CVA (cerebrovascular accident) - with ICH while on plavix and Eliquis in 11-2021. 02-11-2023 Chronic. Will convert back to po keppra.  H/O spontaneous intraventricular intracranial hemorrhage 10/2021 - while on plavix and Eliquis 02-12-2023 his ICH while on plavix and Eliquis is reason he as taken off systemic anticoagulation and not started back on it.  Coronary artery disease 02-07-2023 stable. 02-11-2023 restart ASA 81 mg daily. On lipitor 80 mg at bedtime. On coreg 12.5 mg bid 02-13-2023 stable. On ASA 81 mg daily, lipitor 80 mg at bedtime, coreg 12.5 mg bid.  ESRD on hemodialysis (HCC) 02-07-2023 thru 02-19-2023 continue with HD per  nephrology 02-10-2023 thru 02-13-2023 continue HD  Anemia due to chronic kidney disease, on chronic dialysis (HCC) 02-07-2023 stable. 02-10-2023  Hgb 8.6 02-13-2023 HgB 8.1 g/dl. Stable.  Paroxysmal atrial fibrillation (HCC) - not on systemic anticoagulation due to ICH while on Eliquis in 11-2021. 02-07-2023 continue with amiodarone 200 mg qday 02-08-2023 off systemic anticoagulation in preparation BKA  02-09-2023 will need to ask ortho when systemic anticoagulation can be resumed after his right BKA. 02-10-2023 have asked ortho when eliquis can be restarted 02-11-2023 Continue amiodarone 200 mg qday. Upon further review of his old EMR and cardiology records. Pt was taken off systemic anticoagulation by neurology after his intracerebral hemorrhage in 11-2021.  Pt was also on plavix at that time. This was also stopped.  However, given he has PVD and it at  risk of losing his left LE to amputation, I think that low dose ASA is the best that can be done to balance his PVD, CVA risk and minimize bleeding.  02-12-2023 continue with ASA 81 mg daily. 02-13-2023 had a bout of tachycardia this AM. RN though it was rapid afib. Pt found to be hypoglycemic. Continue with amiodarone. Only on ASA for CVA prophylaxis due to prior hx of ICH while taking eliquis in 11-2021.  Uncontrolled type 2 diabetes mellitus with hyperglycemia, with long-term current use of insulin (HCC) 02-07-2023 CBG low today. Pt confused. Will give 1/2 amp D50 IV. Hypoglycemia may be due to pt being NPO for his angiogram today. 02-08-2023 diabetic hypoglycemia improved with IVF with dextrose. 02-09-2023 continue D5 LR @ 25 ml/hr until po intake back to normal. 02-10-2023 pt advised he need to continue to eat 3 times a day, even if it is just a snack. 02-12-2023 continue encourage pt that he needs to continue to eat TID and have lots of protein in his diet to promote wound healing. 02-13-2023 pt not on any diabetic meds. I think his hypoglycemia is more related to lack of adequate PO intake. Advised pt that he need to eat food even if he does feel hungry or he doesn't like the taste of his food.    Essential hypertension, benign 02-07-2023 continue coreg 12.5 mg bid. 02-08-2023 stable. Continue coreg 02-09-2023 stable. 02-10-2023 wife states pt has a cough from HTN meds. Pt on coreg for BP and afib. Changing to toprol-xl will also likely give him cough. Wife agreeable to staying on coreg.  02-11-2023 hold parameters placed on coreg. Hold for SBP <120 or HR <60  02-12-2023 occ bradycardia with HR in the 50s       DVT prophylaxis: SCD's Start: 02/09/23 1743 Place and maintain sequential compression device Start: 02/05/23 1515    Code Status: Full Code Family Communication: no family at bedside Disposition Plan: SNF Reason for continuing need for hospitalization: medically  stable for DC to SNF.  Objective: Vitals:   02/13/23 0449 02/13/23 0453 02/13/23 0533 02/13/23 0833  BP:    127/61  Pulse: (S) 65 65 64 60  Resp: 18 13 12 14   Temp:  98.4 F (36.9 C)  98.4 F (36.9 C)  TempSrc:  Oral  Oral  SpO2: 95% 98% 100% 100%  Weight:      Height:        Intake/Output Summary (Last 24 hours) at 02/13/2023 1108 Last data filed at 02/13/2023 1000 Gross per 24 hour  Intake 250 ml  Output 0 ml  Net 250 ml   Filed Weights   02/10/23 0431 02/11/23 0500  Weight: 80  kg 78.4 kg    Examination:  Physical Exam Vitals and nursing note reviewed.  HENT:     Head: Normocephalic and atraumatic.     Nose: Nose normal.  Eyes:     Comments: Missing left eye  Cardiovascular:     Rate and Rhythm: Normal rate and regular rhythm.  Pulmonary:     Effort: Pulmonary effort is normal.  Abdominal:     General: Bowel sounds are normal. There is no distension.  Musculoskeletal:     Comments: Right BKA  Skin:    General: Skin is warm and dry.     Capillary Refill: Capillary refill takes less than 2 seconds.     Comments: He does NOT have a left heel ulcer.  Neurological:     Mental Status: He is alert and oriented to person, place, and time.     Data Reviewed: I have personally reviewed following labs and imaging studies  CBC: Recent Labs  Lab 02/07/23 0318 02/09/23 0323 02/09/23 1420 02/10/23 0310 02/12/23 2011 02/13/23 0752  WBC 17.9* 16.1*  --  12.8* 18.1* 18.4*  NEUTROABS  --   --   --  11.4*  --  15.3*  HGB 8.1* 7.9* 9.9* 8.6* 7.6* 8.1*  HCT 25.4* 25.5* 29.0* 28.8* 25.2* 26.2*  MCV 70.8* 72.6*  --  74.8* 75.2* 74.2*  PLT 224 219  --  194 241 221   Basic Metabolic Panel: Recent Labs  Lab 02/07/23 0318 02/09/23 0323 02/09/23 1420 02/10/23 0310 02/12/23 2011 02/13/23 0752  NA 135 136 134* 134* 138 136  K 4.0 4.1 4.0 3.7 4.0 3.6  CL 101 97* 97* 98 100 97*  CO2 19* 24  --  25 20* 25  GLUCOSE 103* 98 78 149* 77 90  BUN 105* 72* 38* 45* 92*  45*  CREATININE 14.30* 9.40* 5.90* 6.73* 11.72* 7.25*  CALCIUM 8.7* 8.7*  --  8.4* 8.4* 8.5*  PHOS  --   --   --   --  7.2*  --    GFR: Estimated Creatinine Clearance: 12.9 mL/min (A) (by C-G formula based on SCr of 7.25 mg/dL (H)). Liver Function Tests: Recent Labs  Lab 02/10/23 0310 02/12/23 2011 02/13/23 0752  AST 10*  --  15  ALT 7  --  6  ALKPHOS 67  --  64  BILITOT 0.5  --  0.6  PROT 6.3*  --  5.5*  ALBUMIN 2.0* 1.8* 1.9*   CBG: Recent Labs  Lab 02/12/23 1619 02/12/23 2105 02/13/23 0537 02/13/23 0641 02/13/23 0836  GLUCAP 79 77 59* 87 80    Recent Results (from the past 240 hours)  Culture, blood (routine x 2)     Status: None   Collection Time: 02/03/23 12:22 PM   Specimen: BLOOD RIGHT ARM  Result Value Ref Range Status   Specimen Description BLOOD RIGHT ARM  Final   Special Requests   Final    BOTTLES DRAWN AEROBIC AND ANAEROBIC Blood Culture results may not be optimal due to an inadequate volume of blood received in culture bottles   Culture   Final    NO GROWTH 5 DAYS Performed at [facility], [address], [place], [place] [address]    Report Status 02/08/2023 FINAL  Final  Culture, blood (routine x 2)     Status: None   Collection Time: 02/03/23 12:27 PM   Specimen: BLOOD RIGHT HAND  Result Value Ref Range Status   Specimen Description BLOOD RIGHT  HAND  Final   Special Requests   Final    BOTTLES DRAWN AEROBIC AND ANAEROBIC Blood Culture results may not be optimal due to an inadequate volume of blood received in culture bottles   Culture   Final    NO GROWTH 5 DAYS Performed at [facility], [address] [place], [place] [address]    Report Status 02/08/2023 FINAL  Final  Aerobic/Anaerobic Culture w Gram Stain (surgical/deep wound)     Status: None   Collection Time: 02/04/23 12:06 PM   Specimen: Foot, Right; Tissue  Result Value Ref Range Status   Specimen Description TISSUE  Final   Special Requests RIGHT FOOT  Final   Gram  Stain   Final    FEW WBC PRESENT, PREDOMINANTLY PMN ABUNDANT GRAM POSITIVE COCCI IN PAIRS RARE GRAM NEGATIVE COCCOBACILLI    Culture   Final    ABUNDANT STAPHYLOCOCCUS AUREUS MODERATE CORYNEBACTERIUM STRIATUM Standardized susceptibility testing for this organism is not available. NO ANAEROBES ISOLATED Performed at [facility], [address] [place], [place] [address]    Report Status 02/09/2023 FINAL  Final   Organism ID, Bacteria STAPHYLOCOCCUS AUREUS  Final      Susceptibility   Staphylococcus aureus - MIC*    CIPROFLOXACIN <=0.5 SENSITIVE Sensitive     ERYTHROMYCIN RESISTANT Resistant     GENTAMICIN <=0.5 SENSITIVE Sensitive     OXACILLIN <=0.25 SENSITIVE Sensitive     TETRACYCLINE <=1 SENSITIVE Sensitive     VANCOMYCIN 1 SENSITIVE Sensitive     TRIMETH/SULFA <=10 SENSITIVE Sensitive     CLINDAMYCIN RESISTANT Resistant     RIFAMPIN <=0.5 SENSITIVE Sensitive     Inducible Clindamycin POSITIVE Resistant     LINEZOLID 2 SENSITIVE Sensitive     * ABUNDANT STAPHYLOCOCCUS AUREUS  Aerobic/Anaerobic Culture w Gram Stain (surgical/deep wound)     Status: None   Collection Time: 02/04/23 12:06 PM   Specimen: Foot, Right; Wound  Result Value Ref Range Status   Specimen Description WOUND  Final   Special Requests RIGHT FOOT  Final   Gram Stain   Final    MODERATE WBC PRESENT, PREDOMINANTLY PMN MODERATE GRAM POSITIVE COCCI    Culture   Final    ABUNDANT STAPHYLOCOCCUS AUREUS SUSCEPTIBILITIES PERFORMED ON PREVIOUS CULTURE WITHIN THE LAST 5 DAYS. MODERATE CORYNEBACTERIUM STRIATUM Standardized susceptibility testing for this organism is not available. NO ANAEROBES ISOLATED Performed at [facility], [address] [place], [place] [address]    Report Status 02/09/2023 FINAL  Final  Surgical pcr screen     Status: Abnormal   Collection Time: 02/08/23  8:05 PM   Specimen: Nasal Mucosa; Nasal Swab  Result Value Ref Range Status   MRSA, PCR NEGATIVE NEGATIVE Final    Staphylococcus aureus POSITIVE (A) NEGATIVE Final    Comment: (NOTE) The Xpert SA Assay (FDA approved for NASAL specimens in patients 23 years of age and older), is one component of a comprehensive surveillance program. It is not intended to diagnose infection nor to guide or monitor treatment. Performed at [facility], [address] [place], [place] [address]      Radiology Studies: No results found.  Scheduled Meds:  amiodarone  200 mg Oral Daily   amphetamine-dextroamphetamine  5 mg Oral Daily   vitamin C  1,000 mg Oral Daily   aspirin EC  81 mg Oral Daily   atorvastatin  80 mg Oral QHS   [START ON 02/15/2023] calcitRIOL  2.25 mcg Oral Q T,Th,Sat-1800   calcium acetate  1,334 mg Oral TID WC   carvedilol  12.5 mg Oral BID   Chlorhexidine Gluconate Cloth  6 each Topical Q0600   [START ON 02/15/2023] cinacalcet  60 mg Oral Q T,Th,Sat-1800   dorzolamide-timolol  1 drop Right Eye BID   levETIRAcetam  500 mg Oral Once per day on Sunday Monday Wednesday Friday   levETIRAcetam  750 mg Oral Once per day on Tuesday Thursday Saturday   mupirocin ointment  1 Application Nasal BID   nutrition supplement (JUVEN)  1 packet Oral BID BM   nystatin   Topical TID   zinc sulfate (50mg  elemental zinc)  220 mg Oral Daily   Continuous Infusions:   LOS: 10 days   Time spent: 40 minutes  Carollee Herter, DO  [facility]  02/13/2023, 11:08 AM

## 2023-02-13 NOTE — Progress Notes (Signed)
CCMD reported EKG on the monitor is now Atrial fibrillation with rate 100-110. BP stable. He was sinus rhythm with frequent PACs and PVCs. Pt has Hx of Atrial fib and already has been treated with Amiodarone 200 mg daily. We will continue to monitor.   Filiberto Pinks, RN

## 2023-02-13 NOTE — Progress Notes (Signed)
Pt refused a phlebotomist drawing blood for lab at this time. He is alert and fully oriented x 4. We will try again later at the next round.   Filiberto Pinks, RN

## 2023-02-13 NOTE — Progress Notes (Signed)
[facility] Progress Note  Subjective: No c/o today.   Vitals:   02/13/23 0533 02/13/23 0833 02/13/23 1242 02/13/23 1540  BP:  127/61 (!) 127/53 (!) 108/50  Pulse: 64 60 68 65  Resp: 12 14 14 15   Temp:  98.4 F (36.9 C) 98.7 F (37.1 C) 98.3 F (36.8 C)  TempSrc:  Oral Axillary Oral  SpO2: 100% 100% 99% 99%  Weight:      Height:        Exam: Gen drowsy, no distress, lying flat No jvd or bruits Chest L clear, R dec'd at base RRR no RG Abd soft ntnd no mass or ascites +bs Ext sp recent R BKA , LLE no edema Neuro is alert nf     LUA AVF+bruit       Renal-related home meds: - norvasc, coreg, hydralazine - phoslo 2 ac tid      OP HD: [facility]  3.5h  bfr 400   85kg    2/2.5   LUA AVF   Heparin 1800 bolus + 400u/hr - rocaltrol 2.25 three times per week - sensipar 60mg  three times per week - mircera 90 mcg q 2     Assessment/ Plan: Diabetic foot infection - on IV abx, poss osteo per MRI. Angiogram showed small vessel disease. SP right BKA 12/18.  ESKD - pt on TTS schedule. Had HD here Monday and Wed, then HD Sat so is now back on schedule. HD Monday for holiday schedule.  HTN - bp's good, cont home meds Volume - well under dry wt, not gaining and no signs of vol excess. UF 0-1 L as tol  Anemia of eskd - Hb 8- 9 here. Follow.  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 Cont po vdra, sensipar and binders w/ meals.   Vinson Moselle MD  [facility] 02/13/2023, 5:34 PM  Recent Labs  Lab 02/12/23 2011 02/13/23 0752  HGB 7.6* 8.1*  ALBUMIN 1.8* 1.9*  CALCIUM 8.4* 8.5*  PHOS 7.2*  --   CREATININE 11.72* 7.25*  K 4.0 3.6   No results for input(s): "IRON", "TIBC", "FERRITIN" in the last 168 hours. Inpatient medications:  amiodarone  200 mg Oral Daily   amphetamine-dextroamphetamine  5 mg Oral Daily   vitamin C  1,000 mg Oral Daily   aspirin EC  81 mg Oral Daily   atorvastatin  80 mg Oral QHS   [START ON 02/15/2023] calcitRIOL  2.25 mcg Oral Q T,Th,Sat-1800    calcium acetate  1,334 mg Oral TID WC   carvedilol  12.5 mg Oral BID   Chlorhexidine Gluconate Cloth  6 each Topical Q0600   [START ON 02/15/2023] cinacalcet  60 mg Oral Q T,Th,Sat-1800   dorzolamide-timolol  1 drop Right Eye BID   levETIRAcetam  500 mg Oral Once per day on Sunday Monday Wednesday Friday   levETIRAcetam  750 mg Oral Once per day on Tuesday Thursday Saturday   mupirocin ointment  1 Application Nasal BID   nutrition supplement (JUVEN)  1 packet Oral BID BM   nystatin   Topical TID   zinc sulfate (50mg  elemental zinc)  220 mg Oral Daily      acetaminophen **OR** acetaminophen, camphor-menthol, dextrose, Gerhardt's butt cream, hydrALAZINE, HYDROcodone-acetaminophen, HYDROmorphone (DILAUDID) injection, labetalol, lidocaine, ondansetron (ZOFRAN) IV, oxyCODONE, sodium chloride flush

## 2023-02-13 NOTE — Progress Notes (Signed)
Pt was transferred from HD back to 4E26. Alert and fully oriented x4, appearing agitated.   He did not want to be bother with nursing care. He refused his bedtime medications and refused staff to check blood glucose at night  time which schedules q 4 hrs. He also refused to eat his dinner and supper prior HD.   CBG at HD department was 77 mg/dl per documented. He denied pain or even when he showed signs of pain when RN helped him turning positions, he refused to take pain med.   He has been stable hemodynamically, afebrile, normal respiratory efforts, on room air, SPO2 98%, lungs clear on auscultations, no acute distress at arrival to 4E after HD.   We will try to check his CBG again later at am. Continue to monitor.  Filiberto Pinks, RN

## 2023-02-13 NOTE — Progress Notes (Signed)
    Latest Reference Range & Units 02/12/23 16:19 02/12/23 21:05 02/13/23 05:37 02/13/23 06:41  Glucose-Capillary 70 - 99 mg/dL 79 77 59 (L) 87  (L): Data is abnormally low  Pt refused to eat his meal last night. Hypoglycemic standing order initiated after his CBG this am was 59 mg/dl. Pt was asymptomatic, no distress.  We rechecked his CBG per protocol after he drank a cup of fruit juice, result as document above. We will hand off to the day shift team.  Filiberto Pinks, RN

## 2023-02-13 NOTE — Progress Notes (Signed)
Patient took 2 bites of breakfast and refused anything else.  Will continue to encourage food intake.

## 2023-02-14 ENCOUNTER — Encounter (INDEPENDENT_AMBULATORY_CARE_PROVIDER_SITE_OTHER): Payer: Self-pay

## 2023-02-14 ENCOUNTER — Encounter (INDEPENDENT_AMBULATORY_CARE_PROVIDER_SITE_OTHER): Payer: Medicare HMO

## 2023-02-14 ENCOUNTER — Ambulatory Visit (INDEPENDENT_AMBULATORY_CARE_PROVIDER_SITE_OTHER): Payer: Medicare HMO | Admitting: Nurse Practitioner

## 2023-02-14 DIAGNOSIS — I96 Gangrene, not elsewhere classified: Secondary | ICD-10-CM | POA: Diagnosis not present

## 2023-02-14 LAB — GLUCOSE, CAPILLARY
Glucose-Capillary: 102 mg/dL — ABNORMAL HIGH (ref 70–99)
Glucose-Capillary: 80 mg/dL (ref 70–99)
Glucose-Capillary: 90 mg/dL (ref 70–99)
Glucose-Capillary: 90 mg/dL (ref 70–99)

## 2023-02-14 LAB — CBC
HCT: 25.2 % — ABNORMAL LOW (ref 39.0–52.0)
Hemoglobin: 7.5 g/dL — ABNORMAL LOW (ref 13.0–17.0)
MCH: 22.6 pg — ABNORMAL LOW (ref 26.0–34.0)
MCHC: 29.8 g/dL — ABNORMAL LOW (ref 30.0–36.0)
MCV: 75.9 fL — ABNORMAL LOW (ref 80.0–100.0)
Platelets: 239 10*3/uL (ref 150–400)
RBC: 3.32 MIL/uL — ABNORMAL LOW (ref 4.22–5.81)
RDW: 20.3 % — ABNORMAL HIGH (ref 11.5–15.5)
WBC: 17.4 10*3/uL — ABNORMAL HIGH (ref 4.0–10.5)
nRBC: 0 % (ref 0.0–0.2)

## 2023-02-14 LAB — RENAL FUNCTION PANEL
Albumin: 1.8 g/dL — ABNORMAL LOW (ref 3.5–5.0)
Anion gap: 13 (ref 5–15)
BUN: 78 mg/dL — ABNORMAL HIGH (ref 6–20)
CO2: 23 mmol/L (ref 22–32)
Calcium: 8.6 mg/dL — ABNORMAL LOW (ref 8.9–10.3)
Chloride: 98 mmol/L (ref 98–111)
Creatinine, Ser: 10.39 mg/dL — ABNORMAL HIGH (ref 0.61–1.24)
GFR, Estimated: 5 mL/min — ABNORMAL LOW (ref >60)
Glucose, Bld: 82 mg/dL (ref 70–99)
Phosphorus: 6.6 mg/dL — ABNORMAL HIGH (ref 2.5–4.6)
Potassium: 4.2 mmol/L (ref 3.5–5.1)
Sodium: 134 mmol/L — ABNORMAL LOW (ref 135–145)

## 2023-02-14 MED ORDER — PENTAFLUOROPROP-TETRAFLUOROETH EX AERO: 1.0000 | INHALATION_SPRAY | CUTANEOUS | Status: DC | PRN

## 2023-02-14 MED ORDER — ANTICOAGULANT SODIUM CITRATE 4% (200MG/5ML) IV SOLN: 5.0000 mL | Status: DC | PRN

## 2023-02-14 MED ORDER — NEPRO/CARBSTEADY PO LIQD: 237.0000 mL | ORAL | Status: DC | PRN

## 2023-02-14 MED ORDER — LIDOCAINE-PRILOCAINE 2.5-2.5 % EX CREA: 1.0000 | TOPICAL_CREAM | CUTANEOUS | Status: DC | PRN

## 2023-02-14 MED ORDER — HEPARIN SODIUM (PORCINE) 1000 UNIT/ML DIALYSIS
2000.0000 [IU] | Freq: Once | INTRAMUSCULAR | Status: AC
  Administered 2023-02-14: 2000 [IU] via INTRAVENOUS_CENTRAL
  Filled 2023-02-14: qty 2

## 2023-02-14 MED ORDER — HEPARIN SODIUM (PORCINE) 1000 UNIT/ML DIALYSIS
1500.0000 [IU] | INTRAMUSCULAR | Status: DC | PRN
  Filled 2023-02-14: qty 2

## 2023-02-14 MED ORDER — ALTEPLASE 2 MG IJ SOLR: 2.0000 mg | Freq: Once | INTRAMUSCULAR | Status: DC | PRN

## 2023-02-14 MED ORDER — HEPARIN SODIUM (PORCINE) 1000 UNIT/ML DIALYSIS: 1000.0000 [IU] | INTRAMUSCULAR | Status: DC | PRN

## 2023-02-14 NOTE — Progress Notes (Signed)
Contacted by CSW this afternoon regarding pt's need for [facility] HD clinic due to snf placement at [facility]. Referral submitted to [facility] admissions this afternoon for review. Will assist as needed.   Olivia Canter Renal Navigator [phone]

## 2023-02-14 NOTE — Progress Notes (Addendum)
Physical Therapy Treatment Patient Details Name: [name]. MRN: [id number] DOB: 1970/11/25 Today's Date: 02/14/2023   History of Present Illness Pt is a 52 yr old male who presented 02/03/23 due to necrotic gangrenous R foot and ischemic changes to L heel.  Pt s/p 02/04/23 partial fourth ray amputation and resection of 5th metatarsal bone. 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 PMH ESRD  HTN, amputation L big toe, L eye absent, CVA, afib, Glaucoma, seizure,  PVD, CHF, CAD, chronic diarrhea,STEMI, osteomyelitis    PT Comments  Pt resting in bed on arrival and agreeable to session with steady progress towards acute goals. Pt benefiting from increased education on importance of mobility as pt with some hesitation to mobilize due to pain and anticipation of pain. Physically pt requiring max A to complete bed mobility and max A fading to min A with increased time to maintain sitting balance as pt with L lateral lean initially (suspect some of this due to air mattress). Pt unable to tolerate transfer attempts this session. Pt able to initiate BLEs back to bed at end of session and complete with mod A to reposition trunk once supine. Pt expressing thanks to this PTA for mobility at end of session. Current plan remains appropriate to address deficits and maximize functional independence and decrease caregiver burden. Pt continues to benefit from skilled PT services to progress toward functional mobility goals.     If plan is discharge home, recommend the following: Two people to help with walking and/or transfers;Two people to help with bathing/dressing/bathroom;Assistance with cooking/housework;Assist for transportation;Help with stairs or ramp for entrance   Can travel by private vehicle     No  Equipment Recommendations  Wheelchair (measurements PT);Wheelchair cushion (measurements PT);Hospital bed;Hoyer lift;Other (comment) (slide board)    Recommendations for Other Services        Precautions / Restrictions Precautions Precautions: Fall Precaution Comments: R BKA 12/18 with limb protector; watch BP (hx of syncope); wound vac R leg Restrictions Weight Bearing Restrictions Per Provider Order: Yes RLE Weight Bearing Per Provider Order: Non weight bearing Other Position/Activity Restrictions: limb protector     Mobility  Bed Mobility Overal bed mobility: Needs Assistance Bed Mobility: Rolling, Sidelying to Sit, Sit to Supine Rolling: Min assist, Used rails Sidelying to sit: Max assist, HOB elevated, Used rails   Sit to supine: Mod assist   General bed mobility comments: min A to facilitate rolling and grasp of bedrail, heavy max A to elevate trunk, pt able to initiate BLEs back to bed    Transfers                   General transfer comment: pt declining attempts    Ambulation/Gait                   Stairs             Wheelchair Mobility     Tilt Bed    Modified Rankin (Stroke Patients Only)       Balance Overall balance assessment: Needs assistance Sitting-balance support: Bilateral upper extremity supported, Feet supported Sitting balance-Leahy Scale: Poor Sitting balance - Comments: Max-total assist needed to support his trunk due to pt leaning L. with increased time pt able to maintain with min A Postural control: Left lateral lean  Cognition Arousal: Alert Behavior During Therapy: Flat affect Overall Cognitive Status: Difficult to assess                                 General Comments: pt needing increased encouragement to continue session and for participation with education on benefits and importance of mobility, pt thanking this PTA at end of session        Exercises      General Comments        Pertinent Vitals/Pain Pain Assessment Pain Assessment: 0-10 Pain Score: 10-Worst pain ever Pain Location: RLE Pain Descriptors / Indicators:  Grimacing, Guarding, Discomfort Pain Intervention(s): Monitored during session, Limited activity within patient's tolerance, Repositioned    Home Living                          Prior Function            PT Goals (current goals can now be found in the care plan section) Acute Rehab PT Goals Patient Stated Goal: to reduce pain PT Goal Formulation: With patient/family Time For Goal Achievement: 02/24/23 Progress towards PT goals: Progressing toward goals    Frequency    Min 1X/week      PT Plan      Co-evaluation              AM-PAC PT "6 Clicks" Mobility   Outcome Measure  Help needed turning from your back to your side while in a flat bed without using bedrails?: A Lot Help needed moving from lying on your back to sitting on the side of a flat bed without using bedrails?: Total Help needed moving to and from a bed to a chair (including a wheelchair)?: Total Help needed standing up from a chair using your arms (e.g., wheelchair or bedside chair)?: Total Help needed to walk in hospital room?: Total Help needed climbing 3-5 steps with a railing? : Total 6 Click Score: 7    End of Session Equipment Utilized During Treatment:  (R limb guard) Activity Tolerance: Patient limited by pain (self limiting) Patient left: in bed;with call bell/phone within reach;with bed alarm set Nurse Communication: Mobility status PT Visit Diagnosis: Muscle weakness (generalized) (M62.81);Difficulty in walking, not elsewhere classified (R26.2);Pain;Other abnormalities of gait and mobility (R26.89) Pain - Right/Left: Right Pain - part of body: Leg     Time: 4270-6237 PT Time Calculation (min) (ACUTE ONLY): 17 min  Charges:    $Therapeutic Activity: 8-22 mins PT General Charges $$ ACUTE PT VISIT: 1 Visit                     Montrez Marietta R. PTA Acute Rehabilitation Services Office: [phone]   Catalina Antigua 02/14/2023, 3:44 PM

## 2023-02-14 NOTE — TOC Progression Note (Signed)
Transition of Care (TOC) - Progression Note    Patient Details  Name: [name]. MRN: [id number] Date of Birth: 08/15/1970  Transition of Care [facility]) CM/SW Contact  Eduard Roux, [place] Phone Number: 02/14/2023, 3:01 PM  Clinical Narrative:     Insurance remains pending- insurance has requested updated PT note but patient has refused to work w/therapy. PT states she will try again if times allow to work with patient. CSW sent message to insurance reason for delay of requested information.   CSW spoke with patient's spouse,[name]. CSW advised, received message she inquired about [facility]. Martie Lee, states that is incorrect. She remains agreeable to [facility]. CSW asked for spouse assistance with encouraging patient to work  with PT.   CSW sent message to Renal Navigator, patient will need local HD dialysis clinic while at SNF.   TOC will continue to follow and assist with discharge planning.  Antony Blackbird, MSW, LCSW Clinical Social Worker      Barriers to Discharge: Continued Medical Work up  Expected Discharge Plan and Services In-house Referral: Clinical Social Work     Living arrangements for the past 2 months: Single Family Home                                       Social Determinants of Health (SDOH) Interventions SDOH Screenings   Food Insecurity: Patient Declined (02/04/2023)  Housing: Patient Declined (02/04/2023)  Transportation Needs: Patient Declined (02/04/2023)  Utilities: Patient Declined (02/04/2023)  Financial Resource Strain: High Risk (07/05/2017)  Physical Activity: Inactive (07/05/2017)  Social Connections: Moderately Integrated (07/05/2017)  Stress: Stress Concern Present (07/05/2017)  Tobacco Use: Low Risk  (02/09/2023)  Health Literacy: Low Risk  (06/01/2020)   Received from [facility]    Readmission Risk Interventions    07/27/2021   11:49 AM 11/27/2020    3:06 PM  Readmission Risk Prevention  Plan  Transportation Screening Complete Complete  PCP or Specialist Appt within 3-5 Days  Complete  HRI or Home Care Consult  Complete  Social Work Consult for Recovery Care Planning/Counseling  Complete  Palliative Care Screening  Not Applicable  Medication Review Oceanographer) Complete Complete  SW Recovery Care/Counseling Consult Complete   Palliative Care Screening Not Applicable   Skilled Nursing Facility Not Applicable

## 2023-02-14 NOTE — Progress Notes (Signed)
OT Cancellation Note  Patient Details Name: [name]. MRN: [id number] DOB: 12-26-1970   Cancelled Treatment:    Reason Eval/Treat Not Completed: (P) Patient declined, no reason specified, delivered built up grip for utensils, Pt declined to test them out and declined further therapy, will reattempt tomorrow.   Alexis Goodell 02/14/2023, 4:31 PM

## 2023-02-14 NOTE — Plan of Care (Signed)
  Problem: Education: Goal: Knowledge of General Education information will improve Description: Including pain rating scale, medication(s)/side effects and non-pharmacologic comfort measures Outcome: Progressing   Problem: Clinical Measurements: Goal: Diagnostic test results will improve Outcome: Progressing   

## 2023-02-14 NOTE — Progress Notes (Signed)
  Progress Note   Patient: [name]. MWN:[id number] DOB: May 11, 1970 DOA: 02/03/2023     11 DOS: the patient was seen and examined on 02/14/2023 at 11:50 AM      Brief hospital course: 52 y.o. M with DM, HTN, ESRD on HD, pAF not on AC, gastroparesis, and hx foot ulcer right foot who presented from podiatry clinic for worsening ulcer, found to have osteomyelitis, now s/p BKA.     Assessment and Plan: * Gangrene of right foot (HCC) S/P BKA (below knee amputation), right (HCC) - 02-09-2023 -Postop care per orthopedics  Diabetic hypoglycemia (HCC) Resolved  PAD (peripheral artery disease) (HCC) -Continue low-dose aspirin, Lipitor  History of seizure No seizures observed - Continue Keppra  History of CVA (cerebrovascular accident) - with ICH while on plavix and Eliquis in 11-2021. -Continue aspirin, Lipitor  H/O spontaneous intraventricular intracranial hemorrhage 10/2021 - while on plavix and Eliquis   Coronary artery disease -Continue Coreg  ESRD on hemodialysis (HCC) -Consult nephrology for maintenance HD  Anemia due to chronic kidney disease, on chronic dialysis (HCC) Hemoglobin stable at 8.1 yesterday  Paroxysmal atrial fibrillation (HCC) - not on systemic anticoagulation due to ICH while on Eliquis in 11-2021. Not on systemic anticoagulation due to history of bleeding - Continue amiodarone, carvedilol  Uncontrolled type 2 diabetes mellitus with hyperglycemia, with long-term current use of insulin (HCC) Glucoses improved - Holding insulin   Essential hypertension, benign Blood pressure normal - Continue carvedilol  ADD - Continue Adderall          Subjective: No new concerns.  No nursing concerns.     Physical Exam: BP (!) 112/54   Pulse (!) 52   Temp 98.9 F (37.2 C) (Oral)   Resp 16   Ht 6\' 3"  (1.905 m)   Wt 83.8 kg   SpO2 98%   BMI 23.09 kg/m   Adult male, lying in bed, appears weak and tired RRR, no murmurs, no peripheral  edema Respiratory rate normal, lungs clear without rales or wheezes Abdomen soft without tenderness palpation or guarding Oriented to person, place, and situation.  Psychomotor slowing is mild.  Generalized weakness.   Data Reviewed: Glucose normal CBC yesterday showed white blood cell count 18, hemoglobin 8.1 Yesterday comprehensive metabolic panel unremarkable  Family Communication: wife by phone    Disposition: Status is: Inpatient Medically optimized for discharge, but will need significant rehabilitation to return to independeny level of function        Author: Alberteen Sam, MD 02/14/2023 6:38 PM  For on call review [email or web address].

## 2023-02-14 NOTE — Progress Notes (Signed)
   02/14/23 2106  Vitals  Temp 98.8 F (37.1 C)  Temp Source Oral  BP 121/69  BP Location Right Arm  BP Method Automatic  Patient Position (if appropriate) Lying  Pulse Rate (!) 56  Pulse Rate Source Monitor  Resp (!) 6  Oxygen Therapy  SpO2 99 %  O2 Device Room Air  During Treatment Monitoring  Blood Flow Rate (mL/min) 19 mL/min  Arterial Pressure (mmHg) 6.46 mmHg  Venous Pressure (mmHg) 23.23 mmHg  TMP (mmHg) 0.2 mmHg  Ultrafiltration Rate (mL/min) 130 mL/min  Dialysate Flow Rate (mL/min) 300 ml/min  Dialysate Potassium Concentration 3  Dialysate Calcium Concentration 2.5  Duration of HD Treatment -hour(s) 3 hour(s)  Cumulative Fluid Removed (mL) per Treatment  0.03  HD Safety Checks Performed Yes  Intra-Hemodialysis Comments Tx completed  Post Treatment  Dialyzer Clearance Lightly streaked  Liters Processed 62.3  Fluid Removed (mL) 0 mL  Tolerated HD Treatment Yes  Post-Hemodialysis Comments pt stable  AVG/AVF Arterial Site Held (minutes) 10 minutes  AVG/AVF Venous Site Held (minutes) 10 minutes  Fistula / Graft Left Upper arm  No placement date or time found.   Orientation: Left  Access Location: Upper arm  Site Condition No complications  Fistula / Graft Assessment Present;Thrill;Bruit  Status Deaccessed  Needle Size 15  Drainage Description None   Pt stable post treatment

## 2023-02-14 NOTE — Plan of Care (Signed)
[name] plan of care was reviewed.  Problem: Clinical Measurements: Goal: Ability to maintain clinical measurements within normal limits will improve Outcome: Progressing Goal: Will remain free from infection Outcome: Progressing Goal: Diagnostic test results will improve Outcome: Progressing Goal: Respiratory complications will improve Outcome: Progressing Goal: Cardiovascular complication will be avoided Outcome: Progressing   Problem: Activity: Goal: Risk for activity intolerance will decrease Outcome: Progressing   Problem: Nutrition: Goal: Adequate nutrition will be maintained Outcome: Progressing   Problem: Elimination: Goal: Will not experience complications related to bowel motility Outcome: Progressing Goal: Will not experience complications related to urinary retention Outcome: Progressing   Problem: Pain Management: Goal: General experience of comfort will improve Outcome: Progressing   Problem: Skin Integrity: Goal: Risk for impaired skin integrity will decrease Outcome: Progressing   Problem: Activity: Goal: Ability to return to baseline activity level will improve Outcome: Progressing   Filiberto Pinks, RN

## 2023-02-14 NOTE — Progress Notes (Signed)
[facility] Progress Note   Subjective: Pleasant male lying in bed, RLE stump protector in place. No C/Os. HD later this PM.    Objective Vitals:   02/14/23 0034 02/14/23 0407 02/14/23 0744 02/14/23 1226  BP: (!) 126/57 126/61 111/66 128/70  Pulse: (!) 58 65 (!) 56 (!) 59  Resp: 18 15 17 12   Temp: 98.5 F (36.9 C) 98.6 F (37 C) 99.2 F (37.3 C) 98.2 F (36.8 C)  TempSrc: Axillary Oral Oral Oral  SpO2: 96% 98%  100%  Weight:      Height:       Physical Exam General: Chronically ill appearing male in NAD Heart: S1,S2 RRR No M/R/G Lungs: Decreased in bases otherwise CTAB. No WOB.  Abdomen: NABS. NT Extremities: R BKA in stump protector. No LLE edema Dialysis Access: L AVF + T/B   Additional Objective Labs: Basic Metabolic Panel: Recent Labs  Lab 02/10/23 0310 02/12/23 2011 02/13/23 0752  NA 134* 138 136  K 3.7 4.0 3.6  CL 98 100 97*  CO2 25 20* 25  GLUCOSE 149* 77 90  BUN 45* 92* 45*  CREATININE 6.73* 11.72* 7.25*  CALCIUM 8.4* 8.4* 8.5*  PHOS  --  7.2*  --    Liver Function Tests: Recent Labs  Lab 02/10/23 0310 02/12/23 2011 02/13/23 0752  AST 10*  --  15  ALT 7  --  6  ALKPHOS 67  --  64  BILITOT 0.5  --  0.6  PROT 6.3*  --  5.5*  ALBUMIN 2.0* 1.8* 1.9*   No results for input(s): "LIPASE", "AMYLASE" in the last 168 hours. CBC: Recent Labs  Lab 02/09/23 0323 02/09/23 1420 02/10/23 0310 02/12/23 2011 02/13/23 0752  WBC 16.1*  --  12.8* 18.1* 18.4*  NEUTROABS  --   --  11.4*  --  15.3*  HGB 7.9*   < > 8.6* 7.6* 8.1*  HCT 25.5*   < > 28.8* 25.2* 26.2*  MCV 72.6*  --  74.8* 75.2* 74.2*  PLT 219  --  194 241 221   < > = values in this interval not displayed.   Blood Culture    Component Value Date/Time   SDES TISSUE 02/04/2023 1206   SDES WOUND 02/04/2023 1206   SPECREQUEST RIGHT FOOT 02/04/2023 1206   SPECREQUEST RIGHT FOOT 02/04/2023 1206   CULT  02/04/2023 1206    ABUNDANT STAPHYLOCOCCUS AUREUS MODERATE  CORYNEBACTERIUM STRIATUM Standardized susceptibility testing for this organism is not available. NO ANAEROBES ISOLATED Performed at [facility], [address], [place], [place] [address]    CULT  02/04/2023 1206    ABUNDANT STAPHYLOCOCCUS AUREUS SUSCEPTIBILITIES PERFORMED ON PREVIOUS CULTURE WITHIN THE LAST 5 DAYS. MODERATE CORYNEBACTERIUM STRIATUM Standardized susceptibility testing for this organism is not available. NO ANAEROBES ISOLATED Performed at [facility], [address], [place], [place] [address]    REPTSTATUS 02/09/2023 FINAL 02/04/2023 1206   REPTSTATUS 02/09/2023 FINAL 02/04/2023 1206    Cardiac Enzymes: No results for input(s): "CKTOTAL", "CKMB", "CKMBINDEX", "TROPONINI" in the last 168 hours. CBG: Recent Labs  Lab 02/13/23 2019 02/14/23 0034 02/14/23 0405 02/14/23 0810 02/14/23 1225  GLUCAP 180* 90 90 80 102*   Iron Studies: No results for input(s): "IRON", "TIBC", "TRANSFERRIN", "FERRITIN" in the last 72 hours. @lablastinr3 @ Studies/Results: No results found. Medications:   amiodarone  200 mg Oral Daily   amphetamine-dextroamphetamine  5 mg Oral Daily   vitamin C  1,000 mg Oral Daily  aspirin EC  81 mg Oral Daily   atorvastatin  80 mg Oral QHS   [START ON 02/15/2023] calcitRIOL  2.25 mcg Oral Q T,Th,Sat-1800   calcium acetate  1,334 mg Oral TID WC   carvedilol  12.5 mg Oral BID   Chlorhexidine Gluconate Cloth  6 each Topical Q0600   [START ON 02/15/2023] cinacalcet  60 mg Oral Q T,Th,Sat-1800   dorzolamide-timolol  1 drop Right Eye BID   levETIRAcetam  500 mg Oral Once per day on Sunday Monday Wednesday Friday   levETIRAcetam  750 mg Oral Once per day on Tuesday Thursday Saturday   nutrition supplement (JUVEN)  1 packet Oral BID BM   nystatin   Topical TID   zinc sulfate (50mg  elemental zinc)  220 mg Oral Daily     OP HD: [facility]  3.5h  bfr 400   85kg    2/2.5   LUA AVF    -Heparin 1800 unit initial bolus +  400u/hr - rocaltrol 2.25 three times per week - sensipar 60mg  three times per week - mircera 90 mcg q 2     Assessment/ Plan: Diabetic foot infection - on IV abx, poss osteo per MRI. Angiogram showed small vessel disease. SP right BKA 02/09/2023.  ESKD - pt on TTS schedule. Had HD here Monday and Wed, then HD Sat so is now back on schedule. HD today for holiday schedule then next HD 02/17/2023  HTN - bp's good, cont home meds Volume - well under dry wt, not gaining and no signs of vol excess. UF 0-1 L as tol. Will need to lower EDW S/P BKA.  Anemia of eskd - Hb 8- 9 here. Follow.  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 Cont po vdra, sensipar and binders w/ meals.   Livana Yerian H. Claus Silvestro NP-C 02/14/2023, 2:09 PM  [employer] [phone]

## 2023-02-14 NOTE — Progress Notes (Signed)
PT Cancellation Note  Patient Details Name: [name]. MRN: [id number] DOB: 1970/09/25   Cancelled Treatment:    Reason Eval/Treat Not Completed: (P) Patient declined, no reason specified, pt declining all mobility stating "its just not a good time", max encouragement and education provided, pt agreeable to have this PTA re-attempt in PM. Will check back as schedule allows to continue with PT POC.  Lenora Boys. PTA Acute Rehabilitation Services Office: [phone]    Catalina Antigua 02/14/2023, 12:28 PM

## 2023-02-15 DIAGNOSIS — I96 Gangrene, not elsewhere classified: Secondary | ICD-10-CM | POA: Diagnosis not present

## 2023-02-15 LAB — GLUCOSE, CAPILLARY
Glucose-Capillary: 78 mg/dL (ref 70–99)
Glucose-Capillary: 75 mg/dL (ref 70–99)
Glucose-Capillary: 108 mg/dL — ABNORMAL HIGH (ref 70–99)

## 2023-02-15 MED ORDER — PROSOURCE PLUS PO LIQD
30.0000 mL | Freq: Three times a day (TID) | ORAL | Status: DC
  Administered 2023-02-16 – 2023-02-23 (×19): 30 mL via ORAL
  Filled 2023-02-15 (×22): qty 30

## 2023-02-15 MED ORDER — RENA-VITE PO TABS
1.0000 | ORAL_TABLET | Freq: Every day | ORAL | Status: DC
  Administered 2023-02-15 – 2023-02-22 (×7): 1 via ORAL
  Filled 2023-02-15 (×8): qty 1

## 2023-02-15 MED ORDER — LOPERAMIDE HCL 2 MG PO CAPS
2.0000 mg | ORAL_CAPSULE | ORAL | Status: DC | PRN
  Administered 2023-02-15 – 2023-02-21 (×6): 2 mg via ORAL
  Filled 2023-02-15 (×6): qty 1

## 2023-02-15 NOTE — Progress Notes (Signed)
  Progress Note   Patient: [name]. RUE:[id number] DOB: Feb 07, 1971 DOA: 02/03/2023     12 DOS: the patient was seen and examined on 02/15/2023       Brief hospital course: 52 y.o. M with DM, HTN, ESRD on HD, pAF not on AC, gastroparesis, and hx foot ulcer right foot who presented from podiatry clinic for worsening ulcer, found to have osteomyelitis, now s/p BKA.      Assessment and Plan: * Gangrene of right foot (HCC) S/P BKA (below knee amputation), right (HCC) - 02-09-2023 -Postop care per orthopedics   Diabetic hypoglycemia (HCC) Resolved   PAD (peripheral artery disease) (HCC) -Continue low-dose aspirin, Lipitor   History of seizure No seizures observed - Continue Keppra   History of CVA (cerebrovascular accident) - with ICH while on plavix and Eliquis in 11-2021. 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 Not on systemic anticoagulation due to history of bleeding - Continue amiodarone, carvedilol   Uncontrolled type 2 diabetes mellitus with hyperglycemia, with long-term current use of insulin (HCC) Glucoses improved - Holding insulin     Essential hypertension, benign Blood pressure normal - Continue carvedilol   ADD - Continue Adderall   Stage I pressure ulcer, left buttocks, present on admission Stage I pressure ulcer scrotum, not present on admission - Wound care -Consult nutrition             Subjective: New ulcer on scrotum.  Has chronic diarrhea, noted by wife today.  Other new concerns.  No nursing concerns.  Severely weak.          Physical Exam: BP (!) 145/79 (BP Location: Right Arm)   Pulse (!) 57   Temp 98.3 F (36.8 C) (Oral)   Resp 15   Ht 6\' 3"  (1.905 m)   Wt 83.8 kg   SpO2 96%   BMI 23.09 kg/m   Adult male, lying in bed, appears weak and tired RRR, no murmurs, no peripheral edema Respiratory rate normal, lungs clear without rales or wheezes Abdomen soft without tenderness palpation or guarding Oriented to person, place, and situation.  Psychomotor slowing is mild.  Generalized weakness. Ulcers observed, apear clean, stage I    Data Reviewed: Basic metabolic panel shows mild hyponatremia, elevated creatinine, consistent with renal failure Hypoalbuminemia CBC shows leukocytosis, anemia     Family Communication: Wife    Disposition: Status is: Inpatient         Author: Alberteen Sam, MD 02/15/2023 4:12 PM  For on call review [email or web address].

## 2023-02-15 NOTE — Progress Notes (Addendum)
Initial Nutrition Assessment  DOCUMENTATION CODES:   Not applicable  INTERVENTION:   -Continue 1000 mg vitamin C daily -Continue 220 mg zinc sulfate daily x 14 days -Renal MVI daily -1 packet Juven BID, each packet provides 95 calories, 2.5 grams of protein (collagen), and 9.8 grams of carbohydrate (3 grams sugar); also contains 7 grams of L-arginine and L-glutamine, 300 mg vitamin C, 15 mg vitamin E, 1.2 mcg vitamin B-12, 9.5 mg zinc, 200 mg calcium, and 1.5 g  Calcium Beta-hydroxy-Beta-methylbutyrate to support wound healing  -30 ml Prosource Plus TID, each supplement provides 100 kcals and 15 grams protein -Liberalize diet to regular for widest variety of meal selections; case discussed with nephrology who agrees with liberalized diet  NUTRITION DIAGNOSIS:   Increased nutrient needs related to post-op healing as evidenced by estimated needs.  GOAL:   Patient will meet greater than or equal to 90% of their needs  MONITOR:   PO intake, Supplement acceptance  REASON FOR ASSESSMENT:   Consult Assessment of nutrition requirement/status  ASSESSMENT:   Pt with medical history significant of type 2 diabetes mellitus, acute osteomyelitis of toe of right foot with gangrene status post amputation of the great toe on the right foot, paroxysmal atrial fibrillation not on anticoagulation, hypertension, ESRD on HD, seizure disorder, BPH, diabetic polyneuropathy, GERD, gastroparesis, hypertension.  Per patient and his wife who is at the bedside, patient's wife noted a wound on his right foot on 01/31/23.  Pt admitted with DM rt foot wound.   12/12- MRI concerning for osteomyelitis 12/13- s/p partial fourth ray amputation of rt foot, further resection of fifth metatarsal bone of rt foot, application of antibiotic beads of rt foot 12/14- per podiatry extensive loss of soft tissue on plantar and lateral portion of rt foot and WBC not improving; orthopedics consulted 12/16- angiogram cancelled  due to decreased responsiveness after HD; scrotum macerated with denuded skin- nystatin and lidocaine ordered by MD; per VVS, rt leg not salvageable due to severe tissue loss and lt leg with small, superficial ulceration on heel but also concern for ischemia (plan for angiography) 12/17- s/p angiogram- revealed inline flow to the foot with three-vessel runoff bilaterally but with microvascular disease and minimal filling pedal vessels bilaterally  12/18- s/p rt BKA with application of mirco graft, wound vac, and stump shrinker 12/19- s/p BSE- regular consistency diet with thin liquids  Reviewed I/O's: +0 ml x 24 hours and +2.9 L since admission  Pt unavailable at time of visit. Attempted to speak with pt via call to hospital room phone, however, unable to reach. RD unable to obtain further nutrition-related history or complete nutrition-focused physical exam at this time.     Per H&P, pt initially noticed wound on 01/31/23 and was seen as an outpatient in podiatry office on 02/03/23 where there was significant pus and infection.  Per nephrology notes, pt received HD on Tuesday, Thursday, and Saturday schedule. Unsure of dry weight. Receiving HD on Monday, Wednesday, Friday schedule while in hospital, but will transition back to home  schedule at d/c. Last HD yesterday (02/14/23) and next treatment scheduled for 02/17/23 secondary to holiday.  Per VVS notes, pt is also at high risk for limb loss for LLE.   Pt with variable oral intake. Noted meal completions 10-85%. Noted that pt has instances of refusing care, including therapy and CBG checks. Pt currently on a renal diet with a 1.2 L fluid restriction. Pt has juven ordered, however, is inconsistent with taking.  Reviewed wt hx; no wt loss noted over the past 6 months. Noted pt with history of malnutrition, which is ongoing. Unable to identify malnutrition at this time due to lack of history and completions of nutrition-focused physical exam. Pt  would greatly benefit from addition of oral nutrition supplements. Noted pt has like Prosource supplements in the past.   Case discussed with nephrology via secure chat to inquire about diet and/or fluid restriction liberalization. MD agreed to liberalized stating, stating diet can be adjusted if fluid or metabolic disturbances occur.   Medications reviewed and include adderall, sensipar, keppra, and zinc sulfate.  Per TOC notes, plan for SNF placement once medically stable for discharge. [name] insurance authorization pending and  will need to switch HD center due to SNF facility to [place], but home HD center is in [place].  Lab Results  Component Value Date   HGBA1C 4.5 (L) 11/21/2021   PTA DM medications are none. Per H&P, pt does not take any medications for DM PTA as DM has been in remission since undergoing procedure for gastroparesis.   Labs reviewed: Na: 134, Phos: 6.6 (on Phoslo), CBGS: 75-102 (inpatient orders for glycemic control are none).    Diet Order:   Diet Order             Diet renal with fluid restriction Fluid restriction: 1200 mL Fluid; Room service appropriate? Yes; Fluid consistency: Thin  Diet effective now                   EDUCATION NEEDS:   Not appropriate for education at this time  Skin:  Skin Assessment: Skin Integrity Issues: Skin Integrity Issues:: Stage II, Wound VAC Stage II: sacrum Wound Vac: s/p rt BKA  Last BM:  02/15/23  Height:   Ht Readings from Last 1 Encounters:  02/09/23 6\' 3"  (1.905 m)    Weight:   Wt Readings from Last 1 Encounters:  02/14/23 83.8 kg    Ideal Body Weight:  83.3 kg (adjusted for rt BKA)  BMI:  Body mass index is 23.09 kg/m.  Estimated Nutritional Needs:   Kcal:  2300-2500  Protein:  125-150 grams  Fluid:  1000 ml + UOP    Levada Schilling, RD, LDN, CDCES Registered Dietitian III Certified Diabetes Care and Education Specialist If unable to reach this RD, please use "RD Inpatient" group chat  on secure chat between hours of 8am-4 pm daily

## 2023-02-15 NOTE — Progress Notes (Signed)
[facility] Progress Note   OP HD: [facility]  3.5h  bfr 400   85kg    2/2.5   LUA AVF    -Heparin 1800 unit initial bolus + 400u/hr - rocaltrol 2.25 three times per week - sensipar 60mg  three times per week - mircera 90 mcg q 2     Assessment/ Plan: Diabetic foot infection - on IV abx, poss osteo per MRI. Angiogram showed small vessel disease. SP right BKA 02/09/2023.  ESKD - pt on TTS schedule. Had HD here Monday and Wed, then HD Sat so is now back on schedule.  Already hemodialysis overnight on Monday with no issues, no UF, breathing comfortably;  next HD 02/17/2023 holiday schedule HTN - bp's good, cont home meds; bradycardic, potassium is 4.2 Volume - well under dry wt, not gaining and no signs of vol excess. UF 0-1 L as tol. Will need to lower EDW S/P BKA.  Anemia of eskd - Hb 8- 9 here. Follow.  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 Cont po vdra, sensipar and binders w/ meals.   Subjective: Pleasant male lying in bed, RLE stump protector in place. No C/Os.  Other than diarrhea with approximately 3-4 bowel movements per day, does not sound like a large amount..    Objective Vitals:   02/15/23 0200 02/15/23 0300 02/15/23 0327 02/15/23 0737  BP:   (!) 141/66 (!) 145/79  Pulse: (!) 54 (!) 56 64 (!) 57  Resp: 20  14 15   Temp:   98.6 F (37 C) 98.3 F (36.8 C)  TempSrc:   Axillary Oral  SpO2:   97% 96%  Weight:      Height:       Physical Exam General: Chronically ill appearing male in NAD Heart: S1,S2 bradycardic no M/R/G Lungs: Decreased in bases otherwise CTAB. No WOB.  Abdomen: NABS. NT Extremities: R BKA in stump protector. No LLE edema Dialysis Access: L AVF + T/B   Additional Objective Labs: Basic Metabolic Panel: Recent Labs  Lab 02/12/23 2011 02/13/23 0752 02/14/23 1744  NA 138 136 134*  K 4.0 3.6 4.2  CL 100 97* 98  CO2 20* 25 23  GLUCOSE 77 90 82  BUN 92* 45* 78*  CREATININE 11.72* 7.25* 10.39*  CALCIUM 8.4* 8.5* 8.6*   PHOS 7.2*  --  6.6*   Liver Function Tests: Recent Labs  Lab 02/10/23 0310 02/12/23 2011 02/13/23 0752 02/14/23 1744  AST 10*  --  15  --   ALT 7  --  6  --   ALKPHOS 67  --  64  --   BILITOT 0.5  --  0.6  --   PROT 6.3*  --  5.5*  --   ALBUMIN 2.0* 1.8* 1.9* 1.8*   No results for input(s): "LIPASE", "AMYLASE" in the last 168 hours. CBC: Recent Labs  Lab 02/09/23 0323 02/09/23 1420 02/10/23 0310 02/12/23 2011 02/13/23 0752 02/14/23 1744  WBC 16.1*  --  12.8* 18.1* 18.4* 17.4*  NEUTROABS  --   --  11.4*  --  15.3*  --   HGB 7.9*   < > 8.6* 7.6* 8.1* 7.5*  HCT 25.5*   < > 28.8* 25.2* 26.2* 25.2*  MCV 72.6*  --  74.8* 75.2* 74.2* 75.9*  PLT 219  --  194 241 221 239   < > = values in this interval not displayed.   Blood Culture    Component Value Date/Time   SDES  TISSUE 02/04/2023 1206   SDES WOUND 02/04/2023 1206   SPECREQUEST RIGHT FOOT 02/04/2023 1206   SPECREQUEST RIGHT FOOT 02/04/2023 1206   CULT  02/04/2023 1206    ABUNDANT STAPHYLOCOCCUS AUREUS MODERATE CORYNEBACTERIUM STRIATUM Standardized susceptibility testing for this organism is not available. NO ANAEROBES ISOLATED Performed at [facility], [address], [place], [place] [address]    CULT  02/04/2023 1206    ABUNDANT STAPHYLOCOCCUS AUREUS SUSCEPTIBILITIES PERFORMED ON PREVIOUS CULTURE WITHIN THE LAST 5 DAYS. MODERATE CORYNEBACTERIUM STRIATUM Standardized susceptibility testing for this organism is not available. NO ANAEROBES ISOLATED Performed at [facility], [address], [place], [place] [address]    REPTSTATUS 02/09/2023 FINAL 02/04/2023 1206   REPTSTATUS 02/09/2023 FINAL 02/04/2023 1206    Cardiac Enzymes: No results for input(s): "CKTOTAL", "CKMB", "CKMBINDEX", "TROPONINI" in the last 168 hours. CBG: Recent Labs  Lab 02/14/23 0034 02/14/23 0405 02/14/23 0810 02/14/23 1225 02/15/23 0738  GLUCAP 90 90 80 102* 78   Iron Studies: No results for input(s): "IRON",  "TIBC", "TRANSFERRIN", "FERRITIN" in the last 72 hours. @lablastinr3 @ Studies/Results: No results found. Medications:   amiodarone  200 mg Oral Daily   amphetamine-dextroamphetamine  5 mg Oral Daily   vitamin C  1,000 mg Oral Daily   aspirin EC  81 mg Oral Daily   atorvastatin  80 mg Oral QHS   calcitRIOL  2.25 mcg Oral Q T,Th,Sat-1800   calcium acetate  1,334 mg Oral TID WC   carvedilol  12.5 mg Oral BID   Chlorhexidine Gluconate Cloth  6 each Topical Q0600   cinacalcet  60 mg Oral Q T,Th,Sat-1800   dorzolamide-timolol  1 drop Right Eye BID   levETIRAcetam  500 mg Oral Once per day on Sunday Monday Wednesday Friday   levETIRAcetam  750 mg Oral Once per day on Tuesday Thursday Saturday   nutrition supplement (JUVEN)  1 packet Oral BID BM   nystatin   Topical TID   zinc sulfate (50mg  elemental zinc)  220 mg Oral Daily

## 2023-02-16 DIAGNOSIS — I96 Gangrene, not elsewhere classified: Secondary | ICD-10-CM | POA: Diagnosis not present

## 2023-02-16 MED ORDER — DARBEPOETIN ALFA 150 MCG/0.3ML IJ SOSY
150.0000 ug | PREFILLED_SYRINGE | INTRAMUSCULAR | Status: DC
  Administered 2023-02-17: 150 ug via SUBCUTANEOUS
  Filled 2023-02-16: qty 0.3

## 2023-02-16 NOTE — Progress Notes (Signed)
  Progress Note   Patient: [name]. UJW:[id number] DOB: 07-03-1970 DOA: 02/03/2023     13 DOS: the patient was seen and examined on 02/16/2023 at 11:32AM      Brief hospital course: 52 y.o. M with DM, HTN, ESRD on HD, pAF not on AC, gastroparesis, and hx foot ulcer right foot who presented from podiatry clinic for worsening ulcer, found to have osteomyelitis, now s/p BKA.      Assessment and Plan: * Gangrene of right foot (HCC) S/P BKA (below knee amputation), right (HCC) - 02-09-2023 -Postop care per orthopedics   Diabetic hypoglycemia (HCC) Resolved   PAD (peripheral artery disease) (HCC) -Continue low-dose aspirin, Lipitor   History of seizure No seizures observed - Continue Keppra   History of CVA (cerebrovascular accident) - with ICH while on plavix and Eliquis in 11-2021. 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 Not on systemic anticoagulation due to history of bleeding - Continue amiodarone, carvedilol   Uncontrolled type 2 diabetes mellitus with hyperglycemia, with long-term current use of insulin (HCC) Glucoses improved - Holding insulin     Essential hypertension, benign Blood pressure normal - Continue carvedilol   ADD - Continue Adderall                   Subjective: Some left eye watery discharge today.  No nursing concerns.         Physical Exam: BP (!) 123/53 (BP Location: Right Arm)   Pulse (!) 58   Temp 98.2 F (36.8 C) (Oral)   Resp 17   Ht 6\' 3"  (1.905 m)   Wt 83.8 kg   SpO2 96%   BMI 23.09 kg/m   Adult  male, lying in bed, appears weak and tired RRR, no murmurs, no peripheral edema Respiratory rate normal, lungs clear without rales or wheezes Abdomen soft without tenderness palpation or guarding Oriented to person, place, and situation.  Psychomotor slowing is mild.  Generalized weakness.  Left eye unremarkable       Family Communication: Wife    Disposition: Status is: Inpatient         Author: Alberteen Sam, MD 02/16/2023 3:57 PM  For on call review [email or web address].

## 2023-02-16 NOTE — Progress Notes (Signed)
[facility] Progress Note   Subjective: Seen in room. Wife at bedside. Much more talkative today. C/O pain in R stump-RN notified for pain med. Denies SOB.      Objective Vitals:   02/15/23 1643 02/15/23 1927 02/16/23 0011 02/16/23 0936  BP: 114/75 135/66 (!) 106/55 (!) 105/57  Pulse: (!) 58 60 60 (!) 58  Resp: 14 15 18 17   Temp: 98.8 F (37.1 C) 98.9 F (37.2 C) 98.5 F (36.9 C) 98.2 F (36.8 C)  TempSrc: Oral Oral Oral Oral  SpO2: 96% 96% 100% 94%  Weight:      Height:       Physical Exam General: Chronically ill appearing male in NAD Heart: S1,S2 no M/R/G SR/SB on monitor Lungs: Decreased in bases otherwise CTAB. No WOB.  Abdomen: NABS. NT Extremities: R BKA in stump protector. No LLE edema Dialysis Access: L AVF + T/B  Additional Objective Labs: Basic Metabolic Panel: Recent Labs  Lab 02/12/23 2011 02/13/23 0752 02/14/23 1744  NA 138 136 134*  K 4.0 3.6 4.2  CL 100 97* 98  CO2 20* 25 23  GLUCOSE 77 90 82  BUN 92* 45* 78*  CREATININE 11.72* 7.25* 10.39*  CALCIUM 8.4* 8.5* 8.6*  PHOS 7.2*  --  6.6*   Liver Function Tests: Recent Labs  Lab 02/10/23 0310 02/12/23 2011 02/13/23 0752 02/14/23 1744  AST 10*  --  15  --   ALT 7  --  6  --   ALKPHOS 67  --  64  --   BILITOT 0.5  --  0.6  --   PROT 6.3*  --  5.5*  --   ALBUMIN 2.0* 1.8* 1.9* 1.8*   No results for input(s): "LIPASE", "AMYLASE" in the last 168 hours. CBC: Recent Labs  Lab 02/10/23 0310 02/12/23 2011 02/13/23 0752 02/14/23 1744  WBC 12.8* 18.1* 18.4* 17.4*  NEUTROABS 11.4*  --  15.3*  --   HGB 8.6* 7.6* 8.1* 7.5*  HCT 28.8* 25.2* 26.2* 25.2*  MCV 74.8* 75.2* 74.2* 75.9*  PLT 194 241 221 239   Blood Culture    Component Value Date/Time   SDES TISSUE 02/04/2023 1206   SDES WOUND 02/04/2023 1206   SPECREQUEST RIGHT FOOT 02/04/2023 1206   SPECREQUEST RIGHT FOOT 02/04/2023 1206   CULT  02/04/2023 1206    ABUNDANT STAPHYLOCOCCUS AUREUS MODERATE CORYNEBACTERIUM  STRIATUM Standardized susceptibility testing for this organism is not available. NO ANAEROBES ISOLATED Performed at [facility], [address] [place], [place] [address]    CULT  02/04/2023 1206    ABUNDANT STAPHYLOCOCCUS AUREUS SUSCEPTIBILITIES PERFORMED ON PREVIOUS CULTURE WITHIN THE LAST 5 DAYS. MODERATE CORYNEBACTERIUM STRIATUM Standardized susceptibility testing for this organism is not available. NO ANAEROBES ISOLATED Performed at [facility], [address] [place], [place] [address]    REPTSTATUS 02/09/2023 FINAL 02/04/2023 1206   REPTSTATUS 02/09/2023 FINAL 02/04/2023 1206    Cardiac Enzymes: No results for input(s): "CKTOTAL", "CKMB", "CKMBINDEX", "TROPONINI" in the last 168 hours. CBG: Recent Labs  Lab 02/14/23 0810 02/14/23 1225 02/15/23 0738 02/15/23 1122 02/15/23 1644  GLUCAP 80 102* 78 75 108*   Iron Studies: No results for input(s): "IRON", "TIBC", "TRANSFERRIN", "FERRITIN" in the last 72 hours. @lablastinr3 @ Studies/Results: No results found. Medications:   (feeding supplement) PROSource Plus  30 mL Oral TID BM   amiodarone  200 mg Oral Daily   amphetamine-dextroamphetamine  5 mg Oral Daily   vitamin C  1,000 mg Oral  Daily   aspirin EC  81 mg Oral Daily   atorvastatin  80 mg Oral QHS   calcitRIOL  2.25 mcg Oral Q T,Th,Sat-1800   calcium acetate  1,334 mg Oral TID WC   carvedilol  12.5 mg Oral BID   Chlorhexidine Gluconate Cloth  6 each Topical Q0600   cinacalcet  60 mg Oral Q T,Th,Sat-1800   dorzolamide-timolol  1 drop Right Eye BID   levETIRAcetam  500 mg Oral Once per day on Sunday Monday Wednesday Friday   levETIRAcetam  750 mg Oral Once per day on Tuesday Thursday Saturday   multivitamin  1 tablet Oral QHS   nutrition supplement (JUVEN)  1 packet Oral BID BM   nystatin   Topical TID   zinc sulfate (50mg  elemental zinc)  220 mg Oral Daily     OP HD: [facility]  TTS  3.5h  bfr 400   85kg    2/2.5   LUA AVF    -Heparin  1800 unit initial bolus + 400u/hr - rocaltrol 2.25 three times per week - sensipar 60mg  three times per week - mircera 90 mcg q 2     Assessment/ Plan: Diabetic foot infection - on IV abx, poss osteo per MRI. Angiogram showed small vessel disease. SP right BKA 02/09/2023.  ESKD - pt on TTS schedule. Had HD here Monday and Wed, then HD Sat so is now back on schedule.  Already hemodialysis overnight on Monday with no issues, no UF, breathing comfortably;  next HD 02/17/2023  HTN - BP and volume well controlled. UF as tolerated.  Volume - well under dry wt, not gaining and no signs of vol excess. UF 0-1 L as tol. Will need to lower EDW S/P BKA.  Anemia of eskd - HGB 7.5 02/14/2023. Check Iron panel with HD 02/17/2023. Give ESA with HD Aranesp 150 mcg SQ 02/17/2023  MBD ckd - CCa and phos in range. Cont po vdra, sensipar and binders w/ meals.   Tenicia Gural H. Ayaan Shutes NP-C 02/16/2023, 9:59 AM  [employer] [phone]

## 2023-02-16 NOTE — Progress Notes (Signed)
9:13AM: [name] insurance authorization is still pending. Updated PT has been uploaded as of 12/23.    TOC will continue to follow.   Johnnette Gourd, MSW, LCSWA Transitions of Care [phone]

## 2023-02-17 DIAGNOSIS — I96 Gangrene, not elsewhere classified: Secondary | ICD-10-CM | POA: Diagnosis not present

## 2023-02-17 LAB — RENAL FUNCTION PANEL
Albumin: 2 g/dL — ABNORMAL LOW (ref 3.5–5.0)
Anion gap: 17 — ABNORMAL HIGH (ref 5–15)
BUN: 103 mg/dL — ABNORMAL HIGH (ref 6–20)
CO2: 24 mmol/L (ref 22–32)
Calcium: 9 mg/dL (ref 8.9–10.3)
Chloride: 97 mmol/L — ABNORMAL LOW (ref 98–111)
Creatinine, Ser: 10.62 mg/dL — ABNORMAL HIGH (ref 0.61–1.24)
GFR, Estimated: 5 mL/min — ABNORMAL LOW (ref >60)
Glucose, Bld: 103 mg/dL — ABNORMAL HIGH (ref 70–99)
Phosphorus: 6.9 mg/dL — ABNORMAL HIGH (ref 2.5–4.6)
Potassium: 5 mmol/L (ref 3.5–5.1)
Sodium: 138 mmol/L (ref 135–145)

## 2023-02-17 LAB — CBC
HCT: 26.1 % — ABNORMAL LOW (ref 39.0–52.0)
Hemoglobin: 7.7 g/dL — ABNORMAL LOW (ref 13.0–17.0)
MCH: 22.6 pg — ABNORMAL LOW (ref 26.0–34.0)
MCHC: 29.5 g/dL — ABNORMAL LOW (ref 30.0–36.0)
MCV: 76.5 fL — ABNORMAL LOW (ref 80.0–100.0)
Platelets: 214 10*3/uL (ref 150–400)
RBC: 3.41 MIL/uL — ABNORMAL LOW (ref 4.22–5.81)
RDW: 20.9 % — ABNORMAL HIGH (ref 11.5–15.5)
WBC: 12.8 10*3/uL — ABNORMAL HIGH (ref 4.0–10.5)
nRBC: 0 % (ref 0.0–0.2)

## 2023-02-17 LAB — TYPE AND SCREEN
ABO/RH(D): A POS
Antibody Screen: NEGATIVE

## 2023-02-17 MED ORDER — PSYLLIUM 95 % PO PACK
1.0000 | PACK | Freq: Two times a day (BID) | ORAL | Status: DC
  Administered 2023-02-17 (×2): 1 via ORAL
  Filled 2023-02-17 (×6): qty 1

## 2023-02-17 MED ORDER — ANTICOAGULANT SODIUM CITRATE 4% (200MG/5ML) IV SOLN: 5.0000 mL | Status: DC | PRN

## 2023-02-17 MED ORDER — HEPARIN SODIUM (PORCINE) 1000 UNIT/ML DIALYSIS
1000.0000 [IU] | INTRAMUSCULAR | Status: DC | PRN
Start: 2023-02-17 — End: 2023-02-17

## 2023-02-17 MED ORDER — LIDOCAINE HCL (PF) 1 % IJ SOLN
5.0000 mL | INTRAMUSCULAR | Status: DC | PRN
Start: 2023-02-17 — End: 2023-02-17

## 2023-02-17 MED ORDER — HEPARIN SODIUM (PORCINE) 1000 UNIT/ML DIALYSIS
2000.0000 [IU] | Freq: Once | INTRAMUSCULAR | Status: AC
  Administered 2023-02-17: 2000 [IU] via INTRAVENOUS_CENTRAL
  Filled 2023-02-17: qty 2

## 2023-02-17 MED ORDER — ALTEPLASE 2 MG IJ SOLR: 2.0000 mg | Freq: Once | INTRAMUSCULAR | Status: DC | PRN

## 2023-02-17 MED ORDER — HEPARIN SODIUM (PORCINE) 1000 UNIT/ML DIALYSIS: 2000.0000 [IU] | INTRAMUSCULAR | Status: DC | PRN

## 2023-02-17 MED ORDER — AMMONIUM LACTATE 12 % EX LOTN: 1.0000 | TOPICAL_LOTION | CUTANEOUS | Status: DC | PRN

## 2023-02-17 MED ORDER — PENTAFLUOROPROP-TETRAFLUOROETH EX AERO: 1.0000 | INHALATION_SPRAY | CUTANEOUS | Status: DC | PRN

## 2023-02-17 NOTE — Progress Notes (Signed)
Additional information requested by [facility]/clinic. Information provided this morning. Awaiting determination from [facility]. Will assist as needed.   Olivia Canter Renal Navigator [phone]

## 2023-02-17 NOTE — Plan of Care (Signed)
  Problem: Education: Goal: Knowledge of General Education information will improve Description: Including pain rating scale, medication(s)/side effects and non-pharmacologic comfort measures Outcome: Progressing   Problem: Health Behavior/Discharge Planning: Goal: Ability to manage health-related needs will improve Outcome: Progressing   Problem: Clinical Measurements: Goal: Ability to maintain clinical measurements within normal limits will improve Outcome: Progressing Goal: Will remain free from infection Outcome: Progressing Goal: Diagnostic test results will improve Outcome: Progressing Goal: Respiratory complications will improve Outcome: Progressing Goal: Cardiovascular complication will be avoided Outcome: Progressing   Problem: Activity: Goal: Risk for activity intolerance will decrease Outcome: Progressing   Problem: Nutrition: Goal: Adequate nutrition will be maintained Outcome: Progressing   Problem: Coping: Goal: Level of anxiety will decrease Outcome: Progressing   Problem: Elimination: Goal: Will not experience complications related to bowel motility Outcome: Progressing Goal: Will not experience complications related to urinary retention Outcome: Progressing   Problem: Pain Management: Goal: General experience of comfort will improve Outcome: Progressing   Problem: Safety: Goal: Ability to remain free from injury will improve Outcome: Progressing   Problem: Skin Integrity: Goal: Risk for impaired skin integrity will decrease Outcome: Progressing   Problem: Activity: Goal: Ability to return to baseline activity level will improve Outcome: Progressing   Problem: Cardiovascular: Goal: Ability to achieve and maintain adequate cardiovascular perfusion will improve Outcome: Progressing Goal: Vascular access site(s) Level 0-1 will be maintained Outcome: Progressing   Problem: Health Behavior/Discharge Planning: Goal: Ability to safely manage  health-related needs after discharge will improve Outcome: Progressing   Problem: Education: Goal: Knowledge of the prescribed therapeutic regimen will improve Outcome: Progressing Goal: Ability to verbalize activity precautions or restrictions will improve Outcome: Progressing Goal: Understanding of discharge needs will improve Outcome: Progressing   Problem: Activity: Goal: Ability to perform//tolerate increased activity and mobilize with assistive devices will improve Outcome: Progressing   Problem: Clinical Measurements: Goal: Postoperative complications will be avoided or minimized Outcome: Progressing   Problem: Self-Care: Goal: Ability to meet self-care needs will improve Outcome: Progressing   Problem: Self-Concept: Goal: Ability to maintain and perform role responsibilities to the fullest extent possible will improve Outcome: Progressing   Problem: Pain Management: Goal: Pain level will decrease with appropriate interventions Outcome: Progressing   Problem: Skin Integrity: Goal: Demonstration of wound healing without infection will improve Outcome: Progressing

## 2023-02-17 NOTE — TOC Progression Note (Signed)
Transition of Care (TOC) - Progression Note    Patient Details  Name: [name]. MRN: [id number] Date of Birth: Oct 06, 1970  Transition of Care [facility]) CM/SW Contact  Eduard Roux, [place] Phone Number: 02/17/2023, 1:34 PM  Clinical Narrative:     CSW spoke with patient's wife- informed insurance was denied at this time but will re-submit once he has confirmed HD clinic and is stable for d/c.   TOC will continue to follow and assist with discharge planning.   Antony Blackbird, MSW, LCSW Clinical Social Worker      Barriers to Discharge: Insurance Authorization (clipping at local HD clinic while at [facility]- in progress)  Expected Discharge Plan and Services In-house Referral: Clinical Social Work     Living arrangements for the past 2 months: Single Family Home                                       Social Determinants of Health (SDOH) Interventions SDOH Screenings   Food Insecurity: Patient Declined (02/04/2023)  Housing: Patient Declined (02/04/2023)  Transportation Needs: Patient Declined (02/04/2023)  Utilities: Patient Declined (02/04/2023)  Financial Resource Strain: High Risk (07/05/2017)  Physical Activity: Inactive (07/05/2017)  Social Connections: Moderately Integrated (07/05/2017)  Stress: Stress Concern Present (07/05/2017)  Tobacco Use: Low Risk  (02/09/2023)  Health Literacy: Low Risk  (06/01/2020)   Received from [facility]    Readmission Risk Interventions    07/27/2021   11:49 AM 11/27/2020    3:06 PM  Readmission Risk Prevention Plan  Transportation Screening Complete Complete  PCP or Specialist Appt within 3-5 Days  Complete  HRI or Home Care Consult  Complete  Social Work Consult for Recovery Care Planning/Counseling  Complete  Palliative Care Screening  Not Applicable  Medication Review Oceanographer) Complete Complete  SW Recovery Care/Counseling Consult Complete   Palliative Care Screening Not Applicable    Skilled Nursing Facility Not Applicable

## 2023-02-17 NOTE — Progress Notes (Signed)
Received patient in bed to unit.  Alert and oriented.  Informed consent signed and in chart.   TX duration: 2 hours and 5 minutes.  Patient wanted to come off of machine and terminated dialysis session.  AMA paperwork signed, NP Alonna Buckler informed.  Patient tolerated well.  Transported back to the room  Alert, without acute distress.  Hand-off given to patient's nurse.   Access used: Left Upper arm fistula Access issues: BFR adjusted to 350 due to high arterial pressures.  Total UF removed: Medication(s) given: Heparin   02/17/23 1536  Vitals  Temp 98.4 F (36.9 C)  Temp Source Oral  BP 126/64  MAP (mmHg) 83  BP Location Right Arm  BP Method Automatic  Patient Position (if appropriate) Lying  Pulse Rate (!) 51  Pulse Rate Source Monitor  ECG Heart Rate (!) 51  Resp 13  Oxygen Therapy  SpO2 99 %  O2 Device Room Air  During Treatment Monitoring  Duration of HD Treatment -hour(s) 2.08 hour(s)  HD Safety Checks Performed Yes  Intra-Hemodialysis Comments See progress note (Patient terminated session early, AMA paperwork signed.  NP Manson Passey informed.)  Dialysis Fluid Bolus Normal Saline  Bolus Amount (mL) 300 mL  Post Treatment  Dialyzer Clearance Clear  Liters Processed 44.4  Fluid Removed (mL) 400 mL  Tolerated HD Treatment Yes  Fistula / Graft Left Upper arm  No placement date or time found.   Orientation: Left  Access Location: Upper arm  Status Deaccessed     Stacie Glaze LPN Kidney Dialysis Unit

## 2023-02-17 NOTE — TOC Progression Note (Signed)
Transition of Care (TOC) - Progression Note    Patient Details  Name: [name]. MRN: [id number] Date of Birth: 1970-10-01  Transition of Care [facility]) CM/SW Contact  Eduard Roux, [place] Phone Number: 02/17/2023, 10:19 AM  Clinical Narrative:     Insurance remains pending & still waiting on  HD clinic confirmation while at [facility], which is in progress .   Will send updated PT notes to insurance once completed- secured chat sent to PT  [facility] will continue to follow and assist with discharge planning.  Antony Blackbird, MSW, LCSW Clinical Social Worker     Barriers to Discharge: Insurance Authorization (clipping at local HD clinic while at [facility]- in progress)  Expected Discharge Plan and Services In-house Referral: Clinical Social Work     Living arrangements for the past 2 months: Single Family Home                                       Social Determinants of Health (SDOH) Interventions SDOH Screenings   Food Insecurity: Patient Declined (02/04/2023)  Housing: Patient Declined (02/04/2023)  Transportation Needs: Patient Declined (02/04/2023)  Utilities: Patient Declined (02/04/2023)  Financial Resource Strain: High Risk (07/05/2017)  Physical Activity: Inactive (07/05/2017)  Social Connections: Moderately Integrated (07/05/2017)  Stress: Stress Concern Present (07/05/2017)  Tobacco Use: Low Risk  (02/09/2023)  Health Literacy: Low Risk  (06/01/2020)   Received from [facility], [facility]    Readmission Risk Interventions    07/27/2021   11:49 AM 11/27/2020    3:06 PM  Readmission Risk Prevention Plan  Transportation Screening Complete Complete  PCP or Specialist Appt within 3-5 Days  Complete  HRI or Home Care Consult  Complete  Social Work Consult for Recovery Care Planning/Counseling  Complete  Palliative Care Screening  Not Applicable  Medication Review Oceanographer) Complete Complete  SW Recovery Care/Counseling Consult Complete    Palliative Care Screening Not Applicable   Skilled Nursing Facility Not Applicable

## 2023-02-17 NOTE — Progress Notes (Signed)
Orthopedic Tech Progress Note Patient Details:  [name]. 02/05/1971 [id number]  Ortho Devices Type of Ortho Device: Prafo boot/shoe Ortho Device/Splint Location: LLE Ortho Device/Splint Interventions: Ordered, Application, Adjustment   Post Interventions Patient Tolerated: Well Instructions Provided: Care of device, Adjustment of device  [name] [name] 02/17/2023, 12:00 PM

## 2023-02-17 NOTE — Progress Notes (Addendum)
[facility] Progress Note   Subjective: Seen in room. Got up with PT this AM and dropped BP to 70s. Consider a combination of pain meds and being slightly dry. Will reduce UFG for HD today. HGB low 7.5 02/14/2023 may need blood. Will order type and screen   C/O L eye lids being stuck together.  Objective Vitals:   02/16/23 2004 02/16/23 2329 02/17/23 0320 02/17/23 0814  BP: (!) 125/37 (!) 144/57 (!) 145/74 127/71  Pulse: 60 60 60 (!) 51  Resp: 16 18 16 16   Temp: 98.6 F (37 C) 98.1 F (36.7 C) 98.6 F (37 C)   TempSrc: Oral Oral Oral Oral  SpO2: 95% 100% 100% 97%  Weight:      Height:       Physical Exam General: Chronically ill appearing male in NAD HEENT: Dried white secretions noted L eye.  Heart: S1,S2 no M/R/G SR/SB on monitor Lungs: Decreased in bases otherwise CTAB. No WOB.  Abdomen: NABS. NT Extremities: R BKA in stump protector. No LLE edema Dialysis Access: L AVF + T/B    Additional Objective Labs: Basic Metabolic Panel: Recent Labs  Lab 02/12/23 2011 02/13/23 0752 02/14/23 1744  NA 138 136 134*  K 4.0 3.6 4.2  CL 100 97* 98  CO2 20* 25 23  GLUCOSE 77 90 82  BUN 92* 45* 78*  CREATININE 11.72* 7.25* 10.39*  CALCIUM 8.4* 8.5* 8.6*  PHOS 7.2*  --  6.6*   Liver Function Tests: Recent Labs  Lab 02/12/23 2011 02/13/23 0752 02/14/23 1744  AST  --  15  --   ALT  --  6  --   ALKPHOS  --  64  --   BILITOT  --  0.6  --   PROT  --  5.5*  --   ALBUMIN 1.8* 1.9* 1.8*   No results for input(s): "LIPASE", "AMYLASE" in the last 168 hours. CBC: Recent Labs  Lab 02/12/23 2011 02/13/23 0752 02/14/23 1744  WBC 18.1* 18.4* 17.4*  NEUTROABS  --  15.3*  --   HGB 7.6* 8.1* 7.5*  HCT 25.2* 26.2* 25.2*  MCV 75.2* 74.2* 75.9*  PLT 241 221 239   Blood Culture    Component Value Date/Time   SDES TISSUE 02/04/2023 1206   SDES WOUND 02/04/2023 1206   SPECREQUEST RIGHT FOOT 02/04/2023 1206   SPECREQUEST RIGHT FOOT 02/04/2023 1206   CULT   02/04/2023 1206    ABUNDANT STAPHYLOCOCCUS AUREUS MODERATE CORYNEBACTERIUM STRIATUM Standardized susceptibility testing for this organism is not available. NO ANAEROBES ISOLATED Performed at [facility], [address] [place], [place] [address]    CULT  02/04/2023 1206    ABUNDANT STAPHYLOCOCCUS AUREUS SUSCEPTIBILITIES PERFORMED ON PREVIOUS CULTURE WITHIN THE LAST 5 DAYS. MODERATE CORYNEBACTERIUM STRIATUM Standardized susceptibility testing for this organism is not available. NO ANAEROBES ISOLATED Performed at [facility], [address], [place], [place] [address]    REPTSTATUS 02/09/2023 FINAL 02/04/2023 1206   REPTSTATUS 02/09/2023 FINAL 02/04/2023 1206    Cardiac Enzymes: No results for input(s): "CKTOTAL", "CKMB", "CKMBINDEX", "TROPONINI" in the last 168 hours. CBG: Recent Labs  Lab 02/14/23 0810 02/14/23 1225 02/15/23 0738 02/15/23 1122 02/15/23 1644  GLUCAP 80 102* 78 75 108*   Iron Studies: No results for input(s): "IRON", "TIBC", "TRANSFERRIN", "FERRITIN" in the last 72 hours. @lablastinr3 @ Studies/Results: No results found. Medications:   (feeding supplement) PROSource Plus  30 mL Oral TID BM   amiodarone  200 mg Oral  Daily   amphetamine-dextroamphetamine  5 mg Oral Daily   vitamin C  1,000 mg Oral Daily   aspirin EC  81 mg Oral Daily   atorvastatin  80 mg Oral QHS   calcitRIOL  2.25 mcg Oral Q T,Th,Sat-1800   calcium acetate  1,334 mg Oral TID WC   carvedilol  12.5 mg Oral BID   cinacalcet  60 mg Oral Q T,Th,Sat-1800   darbepoetin (ARANESP) injection - DIALYSIS  150 mcg Subcutaneous Q Thu-1800   dorzolamide-timolol  1 drop Right Eye BID   levETIRAcetam  500 mg Oral Once per day on Sunday Monday Wednesday Friday   levETIRAcetam  750 mg Oral Once per day on Tuesday Thursday Saturday   multivitamin  1 tablet Oral QHS   nutrition supplement (JUVEN)  1 packet Oral BID BM   nystatin   Topical TID   psyllium  1 packet Oral BID   zinc sulfate  (50mg  elemental zinc)  220 mg Oral Daily     OP HD: [facility]  TTS  3.5h  bfr 400   85kg    2/2.5   LUA AVF    -Heparin 1800 unit initial bolus + 400u/hr - rocaltrol 2.25 three times per week - sensipar 60mg  three times per week - mircera 90 mcg q 2     Assessment/ Plan: Diabetic foot infection - SP right BKA 02/09/2023. Per primary. ESKD - pt on TTS schedule. Next HD 02/17/2023  HTN - BP and volume well controlled. UF as tolerated.  Volume - well under dry wt, not gaining and no signs of vol excess. UF 0-1 L as tol. Will need to lower EDW S/P BKA.  Anemia of eskd - HGB 7.5 02/14/2023. Check Iron panel with HD 02/17/2023. Give ESA with HD Aranesp 150 mcg SQ 02/17/2023. 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 Cont po vdra, sensipar and binders w/ meals.     Jaquanna Ballentine H. Eily Louvier NP-C 02/17/2023, 9:23 AM  [employer] [phone]

## 2023-02-17 NOTE — Progress Notes (Signed)
  Progress Note   Patient: [name]. [id number]:[id number] DOB: 26-Apr-1970 DOA: 02/03/2023     14 DOS: the patient was seen and examined on 02/17/2023 at 9:18AM      Brief hospital course: 52 y.o. M with DM, HTN, ESRD on HD, pAF not on AC, gastroparesis, and hx foot ulcer right foot who presented from podiatry clinic for worsening ulcer, found to have osteomyelitis, now s/p BKA.      Assessment and Plan: * Gangrene of right foot (HCC) S/P BKA (below knee amputation), right (HCC) - 02-09-2023 -Postop care per orthopedics   Diabetic hypoglycemia (HCC) Resolved   PAD (peripheral artery disease) (HCC) -Continue low-dose aspirin, Lipitor   History of seizure No seizures observed - Continue Keppra   History of CVA (cerebrovascular accident) - with ICH while on plavix and Eliquis in 11-2021. 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 Not on systemic anticoagulation due to history of bleeding - Continue amiodarone, carvedilol   Uncontrolled type 2 diabetes mellitus with hyperglycemia, with long-term current use of insulin (HCC) Glucoses improved - Holding insulin     Essential hypertension, benign Blood pressure normal - Continue carvedilol   ADD - Continue Adderall                   Subjective: Some eye discharge today.  No nursing concerns.         Physical Exam: BP 105/63   Pulse (!) 51   Temp 99 F (37.2 C) (Oral)   Resp 12   Ht 6\' 3"  (1.905 m)   Wt 84.1 kg   SpO2 100%   BMI 23.17 kg/m   Adult male, lying in bed, appears weak and  tired Scant serous drainage on eye lid.   RRR, no murmurs, no peripheral edema Respiratory rate normal, lungs clear without rales or wheezes Abdomen soft without tenderness palpation or guarding Oriented to person, place, and situation.  Psychomotor slowing is mild.  Generalized weakness.       Family Communication: Wife at bedside    Disposition: Status is: Inpatient         Author: Alberteen Sam, MD 02/17/2023 2:33 PM  For on call review [email or web address].

## 2023-02-17 NOTE — Progress Notes (Signed)
Physical Therapy Treatment Patient Details Name: [name]. MRN: [id number] DOB: [birth year] Today's Date: 02/17/2023   History of Present Illness Pt is a 52 yr old male who presented 02/03/23 due to necrotic gangrenous R foot and ischemic changes to L heel.  Pt s/p 02/04/23 partial fourth ray amputation and resection of 5th metatarsal bone. 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 PMH ESRD  HTN, amputation L big toe, L eye absent, CVA, afib, Glaucoma, seizure,  PVD, CHF, CAD, chronic diarrhea,STEMI, osteomyelitis    PT Comments  The pt was much more agreeable and eager to participate this date, motivated by his desire not to be in a nursing home long term and to eventually get a prosthetic leg. Focused session on R knee A/AAROM exercises and trying to progress pt's independence and safety with functional mobility. Pt is able to achieve full R knee extension AROM but demonstrates limitations in R knee flexion AAROM (achieving ~80'). The pt is also limited in being able to progress OOB this date by symptomatic orthostatic hypotension, see below. RN and MD notified. He did demonstrate improved initiation and independence with bed mobility though, only needing CGA to roll and modA to transition supine to sit EOB, suggesting progress with PT. [name] bed is currently not inflating properly. Called and coordinated with [employer] bed rep to look at pt's mattress and potentially replace it today. Provided pt and wife with generalized strengthening exercises and post-BKA exercises HEP handouts for pt to perform throughout the day. They verbalized understanding. Will continue to follow acutely.    BP -  142/75 (94) supine  84/33 (47) sitting EOB (symptomatic) 71/40 (49) sitting several min at EOB and performing AROM of legs (symptomatic) 116/62 (79) supine end of session    If plan is discharge home, recommend the following: Two people to help with walking and/or transfers;Two people to help with  bathing/dressing/bathroom;Assistance with cooking/housework;Assist for transportation;Help with stairs or ramp for entrance   Can travel by private vehicle     No  Equipment Recommendations  Wheelchair (measurements PT);Wheelchair cushion (measurements PT);Hospital bed;Hoyer lift;Other (comment);BSC/3in1 (slide board; drop-arm BSC)    Recommendations for Other Services       Precautions / Restrictions Precautions Precautions: Fall Precaution Comments: R BKA 12/18 with limb protector; watch BP (hx of syncope); wound vac R leg Restrictions Weight Bearing Restrictions Per Provider Order: Yes RLE Weight Bearing Per Provider Order: Non weight bearing Other Position/Activity Restrictions: limb protector     Mobility  Bed Mobility Overal bed mobility: Needs Assistance Bed Mobility: Rolling, Sit to Supine, Supine to Sit Rolling: Used rails, Contact guard assist   Supine to sit: Mod assist, HOB elevated, Used rails Sit to supine: HOB elevated, Max assist   General bed mobility comments: Cues provided for pt to reach UE to contralateral rail and bring leg across contralateral leg to roll L and R 1x each this date, CGA for safety, extra time to complete. Pt demonstrated good initiation to bring bil legs off R EOB to sit up R EOB, but needed modA to ascend trunk. MaxA to lift legs and direct trunk back to supine after BP continued to drop while sitting EOB.    Transfers                   General transfer comment: unable to attempt due to drop in BP this date    Ambulation/Gait               General  Gait Details: unable at this time   Stairs             Wheelchair Mobility     Tilt Bed    Modified Rankin (Stroke Patients Only)       Balance Overall balance assessment: Needs assistance Sitting-balance support: Bilateral upper extremity supported, Feet supported, Single extremity supported Sitting balance-Leahy Scale: Poor Sitting balance - Comments:  [name] bed not inflating evenly and pt thus leaned to his R while sitting EOB, modA for static sitting balance, briefly improves to minA intermittently, sitting EOB > 5 min Postural control: Right lateral lean     Standing balance comment: unable to attempt this date                            Cognition Arousal: Alert Behavior During Therapy: WFL for tasks assessed/performed Overall Cognitive Status: Within Functional Limits for tasks assessed                                 General Comments: Pt much more agreeable to session and participating in conversation. Pt willing to push himself to make progress as pt does not desire to be in a nursing home long term and does desire to eventually get a prosthetic leg.        Exercises Amputee Exercises Knee Flexion: AROM, AAROM, Right, 10 reps, Supine, 5 reps, Seated (while supine - x10 AROM, x5 AAROM to give more of a stretch at end range, only achieving ~80' knee flexion AAROM; while sitting EOB - x10 reps AROM) Knee Extension: AROM, Right, 15 reps, Supine, 10 reps, Seated (x15 supine, x10 more sitting) Straight Leg Raises: AROM, Right, 10 reps, Supine    General Comments General comments (skin integrity, edema, etc.): BP - 142/75 (94) supine, 84/33 (47) sitting EOB, 71/40 (49) sitting several min at EOB and performing AROM of legs, 116/62 (79) supine end of session, pt symptomatic; called and coordinated with [employer] bed rep to look at pt's mattress and potentially replace it today; provided pt and wife with generalized strengthening exercises and post-BKA exercises for pt to perform throughout the day, they verbalized understanding      Pertinent Vitals/Pain Pain Assessment Pain Assessment: Faces Faces Pain Scale: Hurts even more Pain Location: RLE Pain Descriptors / Indicators: Grimacing, Guarding, Discomfort, Operative site guarding, Moaning Pain Intervention(s): Limited activity within patient's tolerance,  Monitored during session, Repositioned, RN gave pain meds during session    Home Living                          Prior Function            PT Goals (current goals can now be found in the care plan section) Acute Rehab PT Goals Patient Stated Goal: to reduce pain PT Goal Formulation: With patient/family Time For Goal Achievement: 02/24/23 Potential to Achieve Goals: Fair Progress towards PT goals: Progressing toward goals    Frequency    Min 1X/week      PT Plan      Co-evaluation              AM-PAC PT "6 Clicks" Mobility   Outcome Measure  Help needed turning from your back to your side while in a flat bed without using bedrails?: A Little Help needed moving from lying on your back to sitting on  the side of a flat bed without using bedrails?: A Lot Help needed moving to and from a bed to a chair (including a wheelchair)?: Total Help needed standing up from a chair using your arms (e.g., wheelchair or bedside chair)?: Total Help needed to walk in hospital room?: Total Help needed climbing 3-5 steps with a railing? : Total 6 Click Score: 9    End of Session Equipment Utilized During Treatment:  (R limb guard) Activity Tolerance: Treatment limited secondary to medical complications (Comment) (limited y drop in BP) Patient left: in bed;with call bell/phone within reach;with bed alarm set Nurse Communication: Mobility status PT Visit Diagnosis: Muscle weakness (generalized) (M62.81);Difficulty in walking, not elsewhere classified (R26.2);Pain;Other abnormalities of gait and mobility (R26.89) Pain - Right/Left: Right Pain - part of body: Leg     Time: 0829-0910 PT Time Calculation (min) (ACUTE ONLY): 41 min  Charges:    $Therapeutic Exercise: 8-22 mins $Therapeutic Activity: 23-37 mins PT General Charges $$ ACUTE PT VISIT: 1 Visit                     Virgil Benedict, PT, DPT Acute Rehabilitation Services  Office: [phone]    Bettina Gavia 02/17/2023, 10:36 AM

## 2023-02-18 DIAGNOSIS — I96 Gangrene, not elsewhere classified: Secondary | ICD-10-CM | POA: Diagnosis not present

## 2023-02-18 NOTE — TOC Progression Note (Signed)
Transition of Care (TOC) - Progression Note    Patient Details  Name: [name]. MRN: [id number] Date of Birth: January 30, 1971  Transition [facility]) CM/SW Contact  Erin Sons, [place] Phone Number: 02/18/2023, 3:38 PM  Clinical Narrative:     CSW initiated SNF auth request in online portal; auth: [id number] Berkley Harvey is pending.      Barriers to Discharge: Insurance Authorization   Expected Discharge Plan and Services In-house Referral: Clinical Social Work     Living arrangements for the past 2 months: Single Family Home                                       Social Determinants of Health (SDOH) Interventions SDOH Screenings   Food Insecurity: Patient Declined (02/04/2023)  Housing: Patient Declined (02/04/2023)  Transportation Needs: Patient Declined (02/04/2023)  Utilities: Patient Declined (02/04/2023)  Financial Resource Strain: High Risk (07/05/2017)  Physical Activity: Inactive (07/05/2017)  Social Connections: Moderately Integrated (07/05/2017)  Stress: Stress Concern Present (07/05/2017)  Tobacco Use: Low Risk  (02/09/2023)  Health Literacy: Low Risk  (06/01/2020)   Received from [facility], [facility]    Readmission Risk Interventions    07/27/2021   11:49 AM 11/27/2020    3:06 PM  Readmission Risk Prevention Plan  Transportation Screening Complete Complete  PCP or Specialist Appt within 3-5 Days  Complete  HRI or Home Care Consult  Complete  Social Work Consult for Recovery Care Planning/Counseling  Complete  Palliative Care Screening  Not Applicable  Medication Review Oceanographer) Complete Complete  SW Recovery Care/Counseling Consult Complete   Palliative Care Screening Not Applicable   Skilled Nursing Facility Not Applicable

## 2023-02-18 NOTE — Progress Notes (Signed)
  Progress Note   Patient: [name]. [id number]:[id number] DOB: Oct 11, 1970 DOA: 02/03/2023     15 DOS: the patient was seen and examined on 02/18/2023 at 10:56AM      Brief hospital course: 52 y.o. M with DM, HTN, ESRD on HD, pAF not on AC, gastroparesis, and hx foot ulcer right foot who presented from podiatry clinic for worsening ulcer, found to have osteomyelitis, now s/p BKA.      Assessment and Plan: * Gangrene of right foot (HCC) S/P BKA (below knee amputation), right [facility]) - 02-09-2023 Doing well post-op - Follow up with [name] post discharge  PAD (peripheral artery disease) (HCC) History of CVA (cerebrovascular accident)   Coronary artery disease -Continue low-dose aspirin, Lipitor -Continue Coreg   History of seizure - Continue Keppra   ESRD on hemodialysis (HCC) -Consult nephrology for maintenance HD   Anemia due to chronic kidney disease, on chronic dialysis (HCC) Hgb stable   Paroxysmal atrial fibrillation  Not on systemic anticoagulation due to history of bleeding - Continue amiodarone, carvedilol   Uncontrolled type 2 diabetes mellitus with hyperglycemia, with long-term current use of insulin (HCC) Glucoses improved - Holding insulin   Essential hypertension, benign Blood pressure normal - Continue carvedilol   ADD - Continue Adderall                   Subjective: His bed is not working.  No further eye discharge.  No nursing concerns.         Physical Exam: BP 113/65 (BP Location: Right Arm)   Pulse (!) 59   Temp 98.2 F (36.8 C) (Oral)   Resp 16   Ht 6\' 3"  (1.905 m)   Wt 82.6 kg   SpO2 95%   BMI 22.76 kg/m   Adult male, lying in bed, appears weak and tired RRR, no murmurs, no peripheral edema Respiratory rate normal, lungs clear without rales or wheezes Abdomen soft without tenderness palpation or guarding Oriented to person, place, and situation.  Psychomotor slowing is mild.  Generalized weakness. The left eye has  no discharge      Data Reviewed: CBC shows hemoglobin up to 7.7.  White count down to 12.     Family Communication: Wife at the bedside    Disposition: Status is: Inpatient         Author: Alberteen Sam, MD 02/18/2023 4:00 PM  For on call review [email or web address].

## 2023-02-18 NOTE — Progress Notes (Addendum)
Contacted [facility] admissions to request an update on clinic acceptance/schedule. Awaiting a response. Will assist as needed.   Olivia Canter Renal Navigator [phone]  Addendum at 3:14 pm: Pt has been accepted at [facility] on MWF 10:55 am chair time. Pt can start on Tuesday, Dec 31 (holiday schedule due to clinic being closed on 1/1) and will need to arrive at 9:55 am for paperwork prior to treatment. Update provided to attending, nephrologist, and CSW. HD info placed on AVS as well. Contacted renal NP to be advised that orders need to be sent to clinic at d/c.

## 2023-02-18 NOTE — Progress Notes (Signed)
[facility] Progress Note   Subjective: Seen in room. Signed off HD early yesterday - he cannot recall why.  Family says was having some delirium/confusion yesterday which is better today.  UF 0.4L.   Objective Vitals:   02/17/23 1546 02/17/23 1647 02/17/23 2011 02/18/23 0358  BP:  133/68 (!) 119/56 112/61  Pulse:  (!) 53 (!) 59 (!) 57  Resp:  13 13 13   Temp:  98.2 F (36.8 C) 98.4 F (36.9 C) 98.2 F (36.8 C)  TempSrc:  Oral Oral Oral  SpO2:  100% 97% 97%  Weight: 82.6 kg     Height:       Physical Exam General: Chronically ill appearing male in NAD HEENT: Dried white secretions noted L eye.  Heart: S1,S2 no M/R/G SR/SB on monitor Lungs: Decreased in bases otherwise CTAB. No WOB.  Abdomen: NABS. NT Extremities: R BKA in stump protector. No LLE edema Dialysis Access: L AVF + T/B    Additional Objective Labs: Basic Metabolic Panel: Recent Labs  Lab 02/12/23 2011 02/13/23 0752 02/14/23 1744 02/17/23 1137  NA 138 136 134* 138  K 4.0 3.6 4.2 5.0  CL 100 97* 98 97*  CO2 20* 25 23 24   GLUCOSE 77 90 82 103*  BUN 92* 45* 78* 103*  CREATININE 11.72* 7.25* 10.39* 10.62*  CALCIUM 8.4* 8.5* 8.6* 9.0  PHOS 7.2*  --  6.6* 6.9*   Liver Function Tests: Recent Labs  Lab 02/13/23 0752 02/14/23 1744 02/17/23 1137  AST 15  --   --   ALT 6  --   --   ALKPHOS 64  --   --   BILITOT 0.6  --   --   PROT 5.5*  --   --   ALBUMIN 1.9* 1.8* 2.0*   No results for input(s): "LIPASE", "AMYLASE" in the last 168 hours. CBC: Recent Labs  Lab 02/12/23 2011 02/13/23 0752 02/14/23 1744 02/17/23 1137  WBC 18.1* 18.4* 17.4* 12.8*  NEUTROABS  --  15.3*  --   --   HGB 7.6* 8.1* 7.5* 7.7*  HCT 25.2* 26.2* 25.2* 26.1*  MCV 75.2* 74.2* 75.9* 76.5*  PLT 241 221 239 214   Blood Culture    Component Value Date/Time   SDES TISSUE 02/04/2023 1206   SDES WOUND 02/04/2023 1206   SPECREQUEST RIGHT FOOT 02/04/2023 1206   SPECREQUEST RIGHT FOOT 02/04/2023 1206   CULT   02/04/2023 1206    ABUNDANT STAPHYLOCOCCUS AUREUS MODERATE CORYNEBACTERIUM STRIATUM Standardized susceptibility testing for this organism is not available. NO ANAEROBES ISOLATED Performed at [facility], [address], [place], [place] [address]    CULT  02/04/2023 1206    ABUNDANT STAPHYLOCOCCUS AUREUS SUSCEPTIBILITIES PERFORMED ON PREVIOUS CULTURE WITHIN THE LAST 5 DAYS. MODERATE CORYNEBACTERIUM STRIATUM Standardized susceptibility testing for this organism is not available. NO ANAEROBES ISOLATED Performed at [facility], [address] [place], [place] [address]    REPTSTATUS 02/09/2023 FINAL 02/04/2023 1206   REPTSTATUS 02/09/2023 FINAL 02/04/2023 1206    Cardiac Enzymes: No results for input(s): "CKTOTAL", "CKMB", "CKMBINDEX", "TROPONINI" in the last 168 hours. CBG: Recent Labs  Lab 02/14/23 0810 02/14/23 1225 02/15/23 0738 02/15/23 1122 02/15/23 1644  GLUCAP 80 102* 78 75 108*   Iron Studies: No results for input(s): "IRON", "TIBC", "TRANSFERRIN", "FERRITIN" in the last 72 hours. @lablastinr3 @ Studies/Results: No results found. Medications:   (feeding supplement) PROSource Plus  30 mL Oral TID BM   amiodarone  200 mg Oral  Daily   amphetamine-dextroamphetamine  5 mg Oral Daily   vitamin C  1,000 mg Oral Daily   aspirin EC  81 mg Oral Daily   atorvastatin  80 mg Oral QHS   calcitRIOL  2.25 mcg Oral Q T,Th,Sat-1800   calcium acetate  1,334 mg Oral TID WC   carvedilol  12.5 mg Oral BID   cinacalcet  60 mg Oral Q T,Th,Sat-1800   darbepoetin (ARANESP) injection - DIALYSIS  150 mcg Subcutaneous Q Thu-1800   dorzolamide-timolol  1 drop Right Eye BID   levETIRAcetam  500 mg Oral Once per day on Sunday Monday Wednesday Friday   levETIRAcetam  750 mg Oral Once per day on Tuesday Thursday Saturday   multivitamin  1 tablet Oral QHS   nutrition supplement (JUVEN)  1 packet Oral BID BM   nystatin   Topical TID   psyllium  1 packet Oral BID   zinc sulfate  (50mg  elemental zinc)  220 mg Oral Daily     OP HD: [facility] TTS  3.5h  bfr 400   85kg    2/2.5   LUA AVF    -Heparin 1800 unit initial bolus + 400u/hr - rocaltrol 2.25 three times per week - sensipar 60mg  three times per week - mircera 90 mcg q 2     Assessment/ Plan: Diabetic foot infection - SP right BKA 02/09/2023. Per primary. ESKD - pt on TTS schedule. Next HD 02/19/2023  HTN - BP and volume well controlled. UF as tolerated.  Volume - well under dry wt, not gaining and no signs of vol excess. UF 0-1 L as tol. Will need to lower EDW S/P BKA.  Anemia of eskd - HGB 7.5 02/14/2023. Check Iron panel. Give ESA with HD Aranesp 150 mcg SQ 02/17/2023. 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 Cont po vdra, sensipar and binders w/ meals.    Estill Bakes MD [facility] Pager [phone]

## 2023-02-18 NOTE — Evaluation (Addendum)
Occupational Therapy Evaluation Patient Details Name: [name]. MRN: [id number] DOB: 1971-02-04 Today's Date: 02/18/2023   History of Present Illness Pt is a 52 yr old male who presented 02/03/23 due to necrotic gangrenous R foot and ischemic changes to L heel.  Pt s/p 02/04/23 partial fourth ray amputation and resection of 5th metatarsal bone. 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 PMH ESRD  HTN, amputation L big toe, L eye absent, CVA, afib, Glaucoma, seizure,  PVD, CHF, CAD, chronic diarrhea,STEMI, osteomyelitis   Clinical Impression   [name] minimal pain, agreeable to bed level activities. Pt displays increased ability to perform feeding using built up grips for silverware, is able to complete with set up sitting/bed level. Pt CGA for rolling L/R using bed rails for bed pan. Pt instructed on BUE exercises with theraband/squeeze ball. Pt requires max verbal cueing for L side exercises, decreased awareness and ability to complete exercises or activities with LUE. Pt continues to requite extensive assistance for ADLs/mobility and would benefit from postacute rehab <3hrs/day to maximize functional independence, strength, and instruct on compensatory strategies prior to returning home and to succeed in home setting. Will continue to follow acutely to progress as able.        If plan is discharge home, recommend the following: Two people to help with walking and/or transfers;Two people to help with bathing/dressing/bathroom;Assistance with cooking/housework;Assistance with feeding;Direct supervision/assist for medications management;Direct supervision/assist for financial management;Assist for transportation;Supervision due to cognitive status    Functional Status Assessment     Equipment Recommendations  Hospital bed;Wheelchair cushion (measurements OT);Wheelchair (measurements OT)    Recommendations for Other Services       Precautions / Restrictions Precautions Precautions:  Fall Precaution Comments: R BKA 12/18 with limb protector; watch BP (hx of syncope); wound vac R leg Restrictions Weight Bearing Restrictions Per Provider Order: Yes RLE Weight Bearing Per Provider Order: Non weight bearing Other Position/Activity Restrictions: limb protector      Mobility Bed Mobility Overal bed mobility: Needs Assistance Bed Mobility: Rolling Rolling: Used rails, Contact guard assist         General bed mobility comments: rolling for toilieting on bedpan    Transfers                   General transfer comment: declined      Balance                                           ADL either performed or assessed with clinical judgement   ADL Overall ADL's : Needs assistance/impaired                             Toileting- Clothing Manipulation and Hygiene: Total assistance;Bed level               Vision         Perception         Praxis         Pertinent Vitals/Pain Pain Assessment Pain Assessment: No/denies pain     Extremity/Trunk Assessment             Communication     Cognition Arousal: Alert Behavior During Therapy: WFL for tasks assessed/performed Overall Cognitive Status: Within Functional Limits for tasks assessed  General Comments: Pt agreeable to bed level activities     General Comments       Exercises Exercises: General Upper Extremity General Exercises - Upper Extremity Shoulder Flexion: Strengthening, 10 reps, Theraband Theraband Level (Shoulder Flexion): Level 3 (Green) Shoulder Horizontal ABduction: Strengthening, 10 reps, Theraband Theraband Level (Shoulder Horizontal Abduction): Level 3 (Green) Elbow Flexion: Strengthening, 10 reps, Theraband Theraband Level (Elbow Flexion): Level 3 (Green) Elbow Extension: Strengthening, 10 reps, Theraband Theraband Level (Elbow Extension): Level 3 (Green) Digit Composite Flexion:  Squeeze ball   Shoulder Instructions      Home Living                                          Prior Functioning/Environment                          OT Problem List:        OT Treatment/Interventions: Self-care/ADL training;DME and/or AE instruction;Therapeutic activities;Cognitive remediation/compensation;Patient/family education;Balance training    OT Goals(Current goals can be found in the care plan section) Acute Rehab OT Goals Patient Stated Goal: to improve strength OT Goal Formulation: With patient Time For Goal Achievement: 02/24/23 Potential to Achieve Goals: Fair ADL Goals Pt Will Perform Eating: with modified independence;with adaptive utensils Pt Will Perform Grooming: with modified independence;sitting Pt Will Perform Upper Body Bathing: sitting;with min assist Pt Will Perform Lower Body Bathing: with max assist;sitting/lateral leans Pt Will Transfer to Toilet: with max assist;bedside commode  OT Frequency: Min 1X/week    Co-evaluation              AM-PAC OT "6 Clicks" Daily Activity     Outcome Measure Help from another person eating meals?: A Lot Help from another person taking care of personal grooming?: A Lot Help from another person toileting, which includes using toliet, bedpan, or urinal?: Total Help from another person bathing (including washing, rinsing, drying)?: Total Help from another person to put on and taking off regular upper body clothing?: A Lot Help from another person to put on and taking off regular lower body clothing?: Total 6 Click Score: 9   End of Session Nurse Communication: Mobility status  Activity Tolerance: Patient tolerated treatment well Patient left: in bed;with call bell/phone within reach;with family/visitor present  OT Visit Diagnosis: Unsteadiness on feet (R26.81);Other abnormalities of gait and mobility (R26.89);Repeated falls (R29.6);Muscle weakness (generalized)  (M62.81);Pain;Hemiplegia and hemiparesis Hemiplegia - Right/Left: Left Hemiplegia - dominant/non-dominant: Dominant Pain - Right/Left: Right Pain - part of body: Leg                Time: 9563-8756 OT Time Calculation (min): 16 min Charges:  OT General Charges $OT Visit: 1 Visit OT Treatments $Therapeutic Activity: 8-22 mins  Vinod Mikesell, OTR/L   Alexis Goodell 02/18/2023, 4:06 PM

## 2023-02-19 DIAGNOSIS — I96 Gangrene, not elsewhere classified: Secondary | ICD-10-CM | POA: Diagnosis not present

## 2023-02-19 LAB — RENAL FUNCTION PANEL
Albumin: 2.1 g/dL — ABNORMAL LOW (ref 3.5–5.0)
Anion gap: 15 (ref 5–15)
BUN: 93 mg/dL — ABNORMAL HIGH (ref 6–20)
CO2: 24 mmol/L (ref 22–32)
Calcium: 8.9 mg/dL (ref 8.9–10.3)
Chloride: 99 mmol/L (ref 98–111)
Creatinine, Ser: 9.82 mg/dL — ABNORMAL HIGH (ref 0.61–1.24)
GFR, Estimated: 6 mL/min — ABNORMAL LOW (ref >60)
Glucose, Bld: 86 mg/dL (ref 70–99)
Phosphorus: 6 mg/dL — ABNORMAL HIGH (ref 2.5–4.6)
Potassium: 4.9 mmol/L (ref 3.5–5.1)
Sodium: 138 mmol/L (ref 135–145)

## 2023-02-19 LAB — CBC
HCT: 27.2 % — ABNORMAL LOW (ref 39.0–52.0)
Hemoglobin: 8 g/dL — ABNORMAL LOW (ref 13.0–17.0)
MCH: 22.6 pg — ABNORMAL LOW (ref 26.0–34.0)
MCHC: 29.4 g/dL — ABNORMAL LOW (ref 30.0–36.0)
MCV: 76.8 fL — ABNORMAL LOW (ref 80.0–100.0)
Platelets: 228 10*3/uL (ref 150–400)
RBC: 3.54 MIL/uL — ABNORMAL LOW (ref 4.22–5.81)
RDW: 20.1 % — ABNORMAL HIGH (ref 11.5–15.5)
WBC: 13 10*3/uL — ABNORMAL HIGH (ref 4.0–10.5)
nRBC: 0 % (ref 0.0–0.2)

## 2023-02-19 LAB — IRON AND TIBC
Iron: 34 ug/dL — ABNORMAL LOW (ref 45–182)
Saturation Ratios: 25 % (ref 17.9–39.5)
TIBC: 134 ug/dL — ABNORMAL LOW (ref 250–450)
UIBC: 100 ug/dL

## 2023-02-19 MED ORDER — CARVEDILOL 12.5 MG PO TABS
12.5000 mg | ORAL_TABLET | Freq: Two times a day (BID) | ORAL | Status: DC
  Administered 2023-02-19 – 2023-02-23 (×7): 12.5 mg via ORAL
  Filled 2023-02-19 (×8): qty 1

## 2023-02-19 MED ORDER — ASPIRIN 81 MG PO TBEC
81.0000 mg | DELAYED_RELEASE_TABLET | Freq: Every day | ORAL | Status: DC
  Administered 2023-02-19 – 2023-02-23 (×5): 81 mg via ORAL
  Filled 2023-02-19 (×5): qty 1

## 2023-02-19 MED ORDER — PSYLLIUM 95 % PO PACK
1.0000 | PACK | Freq: Two times a day (BID) | ORAL | Status: DC
  Administered 2023-02-21 – 2023-02-22 (×4): 1 via ORAL
  Filled 2023-02-19 (×10): qty 1

## 2023-02-19 MED ORDER — MIDODRINE HCL 5 MG PO TABS
2.5000 mg | ORAL_TABLET | Freq: Once | ORAL | Status: AC
  Administered 2023-02-19: 2.5 mg via ORAL
  Filled 2023-02-19: qty 1

## 2023-02-19 MED ORDER — NYSTATIN 100000 UNIT/GM EX POWD
Freq: Three times a day (TID) | CUTANEOUS | Status: DC
  Administered 2023-02-19: 1 via TOPICAL
  Filled 2023-02-19: qty 15

## 2023-02-19 MED ORDER — ZINC SULFATE 220 (50 ZN) MG PO CAPS
220.0000 mg | ORAL_CAPSULE | Freq: Every day | ORAL | Status: AC
  Administered 2023-02-19 – 2023-02-21 (×3): 220 mg via ORAL
  Filled 2023-02-19 (×3): qty 1

## 2023-02-19 MED ORDER — LEVETIRACETAM 750 MG PO TABS
750.0000 mg | ORAL_TABLET | ORAL | Status: DC
  Administered 2023-02-19 – 2023-02-22 (×2): 750 mg via ORAL
  Filled 2023-02-19 (×2): qty 1

## 2023-02-19 MED ORDER — DORZOLAMIDE HCL-TIMOLOL MAL 2-0.5 % OP SOLN
1.0000 [drp] | Freq: Two times a day (BID) | OPHTHALMIC | Status: DC
  Administered 2023-02-19 – 2023-02-23 (×9): 1 [drp] via OPHTHALMIC
  Filled 2023-02-19: qty 10

## 2023-02-19 MED ORDER — AMPHETAMINE-DEXTROAMPHET ER 5 MG PO CP24
5.0000 mg | ORAL_CAPSULE | Freq: Every day | ORAL | Status: DC
  Administered 2023-02-19 – 2023-02-23 (×5): 5 mg via ORAL
  Filled 2023-02-19 (×5): qty 1

## 2023-02-19 NOTE — Progress Notes (Signed)
   02/19/23 1647  Assess: MEWS Score  Temp 98.1 F (36.7 C)  BP (!) 93/45  MAP (mmHg) (!) 60  Pulse Rate (!) 56  ECG Heart Rate (!) 56  Resp 12  SpO2 98 %  O2 Device Room Air  Assess: MEWS Score  MEWS Temp 0  MEWS Systolic 1  MEWS Pulse 0  MEWS RR 1  MEWS LOC 0  MEWS Score 2  MEWS Score Color Yellow  Assess: if the MEWS score is Yellow or Red  Were vital signs accurate and taken at a resting state? Yes  Does the patient meet 2 or more of the SIRS criteria? No  MEWS guidelines implemented  Yes, yellow  Treat  MEWS Interventions Considered administering scheduled or prn medications/treatments as ordered  Take Vital Signs  Increase Vital Sign Frequency  Yellow: Q2hr x1, continue Q4hrs until patient remains green for 12hrs  Escalate  MEWS: Escalate Yellow: Discuss with charge nurse and consider notifying provider and/or RRT  Notify: Charge Nurse/RN  Name of Charge Nurse/RN Notified York Pellant RN  Provider Notification  Provider Name/Title dr. Maryfrances Bunnell  Date Provider Notified 02/19/23  Time Provider Notified 1649  Method of Notification Call  Notification Reason Change in status  Provider response At bedside;See new orders  Date of Provider Response 02/19/23  Time of Provider Response 1652  Assess: SIRS CRITERIA  SIRS Temperature  0  SIRS Respirations  0  SIRS Pulse 0  SIRS WBC 0  SIRS Score Sum  0

## 2023-02-19 NOTE — Progress Notes (Signed)
  Progress Note   Patient: [name]. [id number] DOB: 01-15-1971 DOA: 02/03/2023     16 DOS: the patient was seen and examined on 02/19/2023 at 10:48AM      Brief hospital course: 52 y.o. M with DM, HTN, ESRD on HD, pAF not on AC, gastroparesis, and hx foot ulcer right foot who presented from podiatry clinic for worsening ulcer, found to have osteomyelitis, now s/p BKA.      Assessment and Plan: * Gangrene of right foot (HCC) S/P BKA (below knee amputation), right (HCC) - 02-09-2023 -Postop care per orthopedics   Diabetic hypoglycemia (HCC) Resolved   PAD (peripheral artery disease) (HCC) -Continue low-dose aspirin, Lipitor   History of seizure No seizures observed - Continue Keppra   History of CVA (cerebrovascular accident) - with ICH while on plavix and Eliquis in 11-2021. 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 Not on systemic anticoagulation due to history of bleeding - Continue amiodarone, carvedilol   Uncontrolled type 2 diabetes mellitus with hyperglycemia, with long-term current use of insulin (HCC) Glucoses improved - Holding insulin     Essential hypertension, benign Blood pressure normal - Continue carvedilol   ADD - Continue Adderall                   Subjective: No new concerns.  No nursing concerns.         Physical Exam: BP (!) 93/45 (BP Location: Right Arm)   Pulse (!) 56   Temp 98.1 F (36.7 C) (Oral)   Resp 12   Ht 6\' 3"  (1.905 m)   Wt 81.7 kg   SpO2 98%   BMI 22.51 kg/m   Adult male, lying in bed,  appears weak and tired RRR, no murmurs, no peripheral edema Respiratory rate normal, lungs clear without rales or wheezes Abdomen soft without tenderness palpation or guarding Oriented to person, place, and situation.  Psychomotor slowing is mild.  Generalized weakness.        Author: Alberteen Sam, MD 02/19/2023 5:27 PM  For on call review [email or web address].

## 2023-02-19 NOTE — Progress Notes (Addendum)
Subjective: Seen in hemodialysis, no complaints, tolerating HD blood pressure stable,  is aware dialysis schedule TTS  Objective Vital signs in last 24 hours: Vitals:   02/18/23 1347 02/18/23 2057 02/18/23 2334 02/19/23 0555  BP: 113/65 113/69 (!) 154/74 (!) 145/73  Pulse: (!) 59 (!) 54 (!) 55 (!) 56  Resp: 16 18 16 15   Temp:  98.2 F (36.8 C) 97.7 F (36.5 C) 97.8 F (36.6 C)  TempSrc: Oral Oral Oral Oral  SpO2: 95% 95% 96% 96%  Weight:      Height:       Weight change:   Physical Exam: General: Chronically ill-appearing male on HD NAD Heart: RRR no MRG Lungs: CTA bilaterally nonlabored breathing Abdomen: NABS soft NT ND Extremities: Right BKA stump protector wound VAC no pedal edema  dialysis Access: Left arm AV fistula patent on HD   Problem/Plan: Right diabetic foot infection status post BKA 02/09/2023= plan per primary/surgery awaiting SNF placement ESRD -HD TTS on schedule HTN/volume -BP at goal, only on carvedilol, no volume excess, under EDW lower EDW status post BKA Anemia -Hgb 8.0 on ESA, Aranesp 150 mcg given 12/26/// 25% TSAT Secondary hyperparathyroidism -calcium in range phosphorus 6.0 improving on binders/Sensipar History of CVA History of A-fib= not on AC due to bleeding history on amiodarone/carvedilol History of uncontrolled type 2 diabetes mellitus= plan per primary History of ADD on Adderall per primary  Lenny Pastel, PA-C [facility] Beeper [phone] 02/19/2023,8:22 AM  LOS: 16 days   Labs: Basic Metabolic Panel: Recent Labs  Lab 02/14/23 1744 02/17/23 1137 02/19/23 0256  NA 134* 138 138  K 4.2 5.0 4.9  CL 98 97* 99  CO2 23 24 24   GLUCOSE 82 103* 86  BUN 78* 103* 93*  CREATININE 10.39* 10.62* 9.82*  CALCIUM 8.6* 9.0 8.9  PHOS 6.6* 6.9* 6.0*   Liver Function Tests: Recent Labs  Lab 02/13/23 0752 02/14/23 1744 02/17/23 1137 02/19/23 0256  AST 15  --   --   --   ALT 6  --   --   --   ALKPHOS 64  --   --   --    BILITOT 0.6  --   --   --   PROT 5.5*  --   --   --   ALBUMIN 1.9* 1.8* 2.0* 2.1*   No results for input(s): "LIPASE", "AMYLASE" in the last 168 hours. No results for input(s): "AMMONIA" in the last 168 hours. CBC: Recent Labs  Lab 02/12/23 2011 02/13/23 0752 02/14/23 1744 02/17/23 1137 02/19/23 0259  WBC 18.1* 18.4* 17.4* 12.8* 13.0*  NEUTROABS  --  15.3*  --   --   --   HGB 7.6* 8.1* 7.5* 7.7* 8.0*  HCT 25.2* 26.2* 25.2* 26.1* 27.2*  MCV 75.2* 74.2* 75.9* 76.5* 76.8*  PLT 241 221 239 214 228   Cardiac Enzymes: No results for input(s): "CKTOTAL", "CKMB", "CKMBINDEX", "TROPONINI" in the last 168 hours. CBG: Recent Labs  Lab 02/14/23 0810 02/14/23 1225 02/15/23 0738 02/15/23 1122 02/15/23 1644  GLUCAP 80 102* 78 75 108*    Studies/Results: No results found. Medications:   (feeding supplement) PROSource Plus  30 mL Oral TID BM   amiodarone  200 mg Oral Daily   amphetamine-dextroamphetamine  5 mg Oral Daily   vitamin C  1,000 mg Oral Daily   aspirin EC  81 mg Oral Daily   atorvastatin  80 mg Oral QHS   calcitRIOL  2.25 mcg Oral Q T,Th,Sat-1800  calcium acetate  1,334 mg Oral TID WC   carvedilol  12.5 mg Oral BID   cinacalcet  60 mg Oral Q T,Th,Sat-1800   darbepoetin (ARANESP) injection - DIALYSIS  150 mcg Subcutaneous Q Thu-1800   dorzolamide-timolol  1 drop Right Eye BID   levETIRAcetam  500 mg Oral Once per day on Sunday Monday Wednesday Friday   levETIRAcetam  750 mg Oral Once per day on Tuesday Thursday Saturday   multivitamin  1 tablet Oral QHS   nutrition supplement (JUVEN)  1 packet Oral BID BM   nystatin   Topical TID   psyllium  1 packet Oral BID   zinc sulfate (50mg  elemental zinc)  220 mg Oral Daily    I personally saw and evaluated the patient and agree with the assessment and plan by Lenny Pastel, PA-C.   Estill Bakes MD [facility] Pager [phone]

## 2023-02-19 NOTE — Progress Notes (Signed)
Received patient in bed to unit.  Alert and oriented.  Informed consent signed and in chart.   TX duration: 3 hours and 15 minutes  Patient tolerated well.  Transported back to the room  Alert, without acute distress.  Hand-off given to patient's nurse. Elberta Spaniel, RN  Access used: LUE AVF Access issues: none  Total UF removed: 1L Medication(s) given: none Post HD VS: BP 149/77 p 54 temp 97.4 oral, 02 Sat 100 RA Post HD weight: 81.7kg  Freddie Breech, RN [facility]

## 2023-02-19 NOTE — Plan of Care (Signed)
  Problem: Health Behavior/Discharge Planning: Goal: Ability to manage health-related needs will improve Outcome: Not Progressing   Problem: Activity: Goal: Risk for activity intolerance will decrease Outcome: Not Progressing

## 2023-02-20 DIAGNOSIS — I96 Gangrene, not elsewhere classified: Secondary | ICD-10-CM | POA: Diagnosis not present

## 2023-02-20 MED ORDER — HYDROCORTISONE (PERIANAL) 2.5 % EX CREA
TOPICAL_CREAM | Freq: Three times a day (TID) | CUTANEOUS | Status: DC
  Filled 2023-02-20: qty 28.35

## 2023-02-20 MED ORDER — HYDROCORTISONE ACETATE 25 MG RE SUPP: 25.0000 mg | Freq: Two times a day (BID) | RECTAL | Status: DC | PRN

## 2023-02-20 NOTE — Progress Notes (Signed)
°  Progress Note   Patient: [name]. ZOX:[id number] DOB: December 17, 1970 DOA: 02/03/2023     17 DOS: the patient was seen and examined on 02/20/2023 at 10:40AM      Brief hospital course: 52 y.o. M with DM, HTN, ESRD on HD, pAF not on AC, gastroparesis, and hx foot ulcer right foot who presented from podiatry clinic for worsening ulcer, found to have osteomyelitis, now s/p BKA.      Assessment and Plan: * Gangrene of right foot (HCC) S/P BKA (below knee amputation), right (HCC) - 02-09-2023 -Postop care per orthopedics   Diabetic hypoglycemia (HCC) Resolved   PAD (peripheral artery disease) (HCC) -Continue low-dose aspirin, Lipitor   History of seizure No seizures observed - Continue Keppra   History of CVA (cerebrovascular accident) - with ICH while on plavix and Eliquis in 11-2021. 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 Not on systemic anticoagulation due to history of bleeding - Continue amiodarone, carvedilol   Uncontrolled type 2 diabetes mellitus with hyperglycemia, with long-term current use of insulin (HCC) Glucoses improved - Holding insulin     Essential hypertension, benign Blood pressure normal - Continue carvedilol   ADD - Continue Adderall                   Subjective: No new concerns.  No nursing concerns.         Physical Exam: BP 121/72 (BP Location: Right Arm)   Pulse (!) 54   Temp 98.2 F (36.8 C) (Oral)   Resp 16   Ht 6\' 3"  (1.905 m)   Wt 81.7 kg   SpO2 97%   BMI 22.51 kg/m   Adult male, lying in bed,  appears weak and tired RRR, no murmurs, no peripheral edema Respiratory rate normal, lungs clear without rales or wheezes Abdomen soft without tenderness palpation or guarding Oriented to person, place, and situation.  Psychomotor slowing is mild.  Generalized weakness.           Author: Alberteen Sam, MD 02/20/2023 5:17 PM  For on call review [email or web address].

## 2023-02-20 NOTE — Progress Notes (Signed)
Subjective:  seen in rm , no cos,, said tolerated hd   Objective Vital signs in last 24 hours: Vitals:   02/20/23 0403 02/20/23 0431 02/20/23 0504 02/20/23 0841  BP: (!) 170/60 (!) 165/51 (!) 134/46 (!) 127/47  Pulse: 60 (!) 52 (!) 52 (!) 52  Resp: 17   17  Temp: 97.6 F (36.4 C)   97.6 F (36.4 C)  TempSrc: Oral   Oral  SpO2: 97% 98% 100% 95%  Weight:      Height:       Weight change:   Physical Exam: General: Chronically ill-appearing male NAD Heart: RRR no MRG Lungs: CTA bilaterally nonlabored breathing Abdomen: NABS soft NT ND Extremities: Right BKA stump protector wound VAC no pedal edema  dialysis Access: Left arm AVF + bruit      Problem/Plan: Right diabetic foot infection status post BKA 02/09/2023= plan per primary/surgery awaiting SNF placement ESRD -HD TTS on schedule , next hd tues here  HTN/volume -BP at goal, only on carvedilol, no volume excess, under EDW lower EDW status post BKA Anemia -Hgb 8.0 on ESA, Aranesp 150 mcg given 12/26/// 25% TSAT Secondary hyperparathyroidism -calcium in range phosphorus 6.0 improving on binders/Sensipar History of CVA History of A-fib= not on AC due to bleeding history on amiodarone/carvedilol History of uncontrolled type 2 diabetes mellitus= plan per primary History of ADD on Adderall per primary  Lenny Pastel, PA-C [facility] Beeper [phone] 02/20/2023,11:14 AM  LOS: 17 days   Labs: Basic Metabolic Panel: Recent Labs  Lab 02/14/23 1744 02/17/23 1137 02/19/23 0256  NA 134* 138 138  K 4.2 5.0 4.9  CL 98 97* 99  CO2 23 24 24   GLUCOSE 82 103* 86  BUN 78* 103* 93*  CREATININE 10.39* 10.62* 9.82*  CALCIUM 8.6* 9.0 8.9  PHOS 6.6* 6.9* 6.0*   Liver Function Tests: Recent Labs  Lab 02/14/23 1744 02/17/23 1137 02/19/23 0256  ALBUMIN 1.8* 2.0* 2.1*   No results for input(s): "LIPASE", "AMYLASE" in the last 168 hours. No results for input(s): "AMMONIA" in the last 168 hours. CBC: Recent Labs   Lab 02/14/23 1744 02/17/23 1137 02/19/23 0259  WBC 17.4* 12.8* 13.0*  HGB 7.5* 7.7* 8.0*  HCT 25.2* 26.1* 27.2*  MCV 75.9* 76.5* 76.8*  PLT 239 214 228   Cardiac Enzymes: No results for input(s): "CKTOTAL", "CKMB", "CKMBINDEX", "TROPONINI" in the last 168 hours. CBG: Recent Labs  Lab 02/14/23 0810 02/14/23 1225 02/15/23 0738 02/15/23 1122 02/15/23 1644  GLUCAP 80 102* 78 75 108*    Studies/Results: No results found. Medications:   (feeding supplement) PROSource Plus  30 mL Oral TID BM   amiodarone  200 mg Oral Daily   amphetamine-dextroamphetamine  5 mg Oral Daily   vitamin C  1,000 mg Oral Daily   aspirin EC  81 mg Oral Daily   atorvastatin  80 mg Oral QHS   calcitRIOL  2.25 mcg Oral Q T,Th,Sat-1800   calcium acetate  1,334 mg Oral TID WC   carvedilol  12.5 mg Oral BID   cinacalcet  60 mg Oral Q T,Th,Sat-1800   darbepoetin (ARANESP) injection - DIALYSIS  150 mcg Subcutaneous Q Thu-1800   dorzolamide-timolol  1 drop Right Eye BID   hydrocortisone   Rectal TID   levETIRAcetam  500 mg Oral Once per day on Sunday Monday Wednesday Friday   levETIRAcetam  750 mg Oral Once per day on Tuesday Thursday Saturday   multivitamin  1 tablet Oral QHS  nutrition supplement (JUVEN)  1 packet Oral BID BM   nystatin   Topical TID   psyllium  1 packet Oral BID   zinc sulfate (50mg  elemental zinc)  220 mg Oral Daily

## 2023-02-21 DIAGNOSIS — I96 Gangrene, not elsewhere classified: Secondary | ICD-10-CM | POA: Diagnosis not present

## 2023-02-21 LAB — CBC
HCT: 26.7 % — ABNORMAL LOW (ref 39.0–52.0)
Hemoglobin: 7.9 g/dL — ABNORMAL LOW (ref 13.0–17.0)
MCH: 22.8 pg — ABNORMAL LOW (ref 26.0–34.0)
MCHC: 29.6 g/dL — ABNORMAL LOW (ref 30.0–36.0)
MCV: 76.9 fL — ABNORMAL LOW (ref 80.0–100.0)
Platelets: 224 10*3/uL (ref 150–400)
RBC: 3.47 MIL/uL — ABNORMAL LOW (ref 4.22–5.81)
RDW: 20.2 % — ABNORMAL HIGH (ref 11.5–15.5)
WBC: 10.4 10*3/uL (ref 4.0–10.5)
nRBC: 0 % (ref 0.0–0.2)

## 2023-02-21 LAB — BASIC METABOLIC PANEL
Anion gap: 15 (ref 5–15)
BUN: 82 mg/dL — ABNORMAL HIGH (ref 6–20)
CO2: 24 mmol/L (ref 22–32)
Calcium: 8.6 mg/dL — ABNORMAL LOW (ref 8.9–10.3)
Chloride: 100 mmol/L (ref 98–111)
Creatinine, Ser: 8.73 mg/dL — ABNORMAL HIGH (ref 0.61–1.24)
GFR, Estimated: 7 mL/min — ABNORMAL LOW (ref >60)
Glucose, Bld: 88 mg/dL (ref 70–99)
Potassium: 4.5 mmol/L (ref 3.5–5.1)
Sodium: 139 mmol/L (ref 135–145)

## 2023-02-21 MED ORDER — CHLORHEXIDINE GLUCONATE CLOTH 2 % EX PADS
6.0000 | MEDICATED_PAD | Freq: Every day | CUTANEOUS | Status: DC
  Administered 2023-02-22 – 2023-02-23 (×2): 6 via TOPICAL

## 2023-02-21 NOTE — Progress Notes (Signed)
  Progress Note   Patient: [name]. JYN:[id number] DOB: [birth year] DOA: 02/03/2023     18 DOS: the patient was seen and examined on 02/21/2023 at 9:25AM      Brief hospital course: 52 y.o. M with DM, HTN, ESRD on HD, pAF not on AC, gastroparesis, and hx foot ulcer right foot who presented from podiatry clinic for worsening ulcer, found to have osteomyelitis, now s/p BKA.      Assessment and Plan: * Gangrene of right foot (HCC) S/P BKA (below knee amputation), right (HCC) - 02-09-2023 -Postop care per orthopedics   Diabetic hypoglycemia (HCC) Resolved   PAD (peripheral artery disease) (HCC) -Continue low-dose aspirin, Lipitor   History of seizure No seizures observed - Continue Keppra   History of CVA (cerebrovascular accident) - with ICH while on plavix and Eliquis in 11-2021. 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 Not on systemic anticoagulation due to history of bleeding - Continue amiodarone, carvedilol   Uncontrolled type 2 diabetes mellitus with hyperglycemia, with long-term current use of insulin (HCC) Glucoses improved - Holding insulin     Essential hypertension, benign Blood pressure normal - Continue carvedilol   ADD - Continue Adderall                   Subjective: No new concerns.  Still some diarrhea.  Might take Imodium *before* a meal to prevent diarrhea after.  No nursing concerns.         Physical Exam: BP (!) 112/58 (BP Location: Right Arm)   Pulse (!) 59   Temp 98.3 F (36.8 C) (Oral)   Resp 16   Ht 6\' 3"   (1.905 m)   Wt 81.7 kg   SpO2 93%   BMI 22.51 kg/m   Adult male, lying in bed, appears weak and tired RRR, no murmurs, no peripheral edema Respiratory rate normal, lungs clear without rales or wheezes Abdomen soft without tenderness palpation or guarding Oriented to person, place, and situation.  Psychomotor slowing is mild.  Generalized weakness.     Data Reviewed:           Author: Alberteen Sam, MD 02/21/2023 3:57 PM  For on call review [email or web address].

## 2023-02-21 NOTE — Progress Notes (Signed)
[facility] Progress Note   Subjective:   Reports bed is uncomfortable. Also feels like too much UF was removed during last HD. Denies SOB, CP, dizziness, nausea.   Objective Vitals:   02/20/23 2050 02/20/23 2323 02/21/23 0259 02/21/23 0834  BP: (!) 96/57 (!) 95/44 121/65 125/68  Pulse: (!) 58 68 (!) 56 (!) 58  Resp:  16 18 18   Temp:  98.1 F (36.7 C) 98.2 F (36.8 C) 98.3 F (36.8 C)  TempSrc:  Oral Oral Oral  SpO2: 96% 96% 96% 97%  Weight:      Height:       Physical Exam General: Alert male in NAD Heart: RRR, no murmurs, rubs or gallops Lungs: CTA bilaterally, respirations unlabored on RA Abdomen: Soft, non-distended, +BS Extremities: R BKA, no edema noted b/l lower extremities Dialysis Access: LUE AVF + t/b  Additional Objective Labs: Basic Metabolic Panel: Recent Labs  Lab 02/14/23 1744 02/17/23 1137 02/19/23 0256 02/21/23 0257  NA 134* 138 138 139  K 4.2 5.0 4.9 4.5  CL 98 97* 99 100  CO2 23 24 24 24   GLUCOSE 82 103* 86 88  BUN 78* 103* 93* 82*  CREATININE 10.39* 10.62* 9.82* 8.73*  CALCIUM 8.6* 9.0 8.9 8.6*  PHOS 6.6* 6.9* 6.0*  --    Liver Function Tests: Recent Labs  Lab 02/14/23 1744 02/17/23 1137 02/19/23 0256  ALBUMIN 1.8* 2.0* 2.1*   No results for input(s): "LIPASE", "AMYLASE" in the last 168 hours. CBC: Recent Labs  Lab 02/14/23 1744 02/17/23 1137 02/19/23 0259 02/21/23 0257  WBC 17.4* 12.8* 13.0* 10.4  HGB 7.5* 7.7* 8.0* 7.9*  HCT 25.2* 26.1* 27.2* 26.7*  MCV 75.9* 76.5* 76.8* 76.9*  PLT 239 214 228 224   Blood Culture    Component Value Date/Time   SDES TISSUE 02/04/2023 1206   SDES WOUND 02/04/2023 1206   SPECREQUEST RIGHT FOOT 02/04/2023 1206   SPECREQUEST RIGHT FOOT 02/04/2023 1206   CULT  02/04/2023 1206    ABUNDANT STAPHYLOCOCCUS AUREUS MODERATE CORYNEBACTERIUM STRIATUM Standardized susceptibility testing for this organism is not available. NO ANAEROBES ISOLATED Performed at [facility]  Lab, [address] [place], [place] [address]    CULT  02/04/2023 1206    ABUNDANT STAPHYLOCOCCUS AUREUS SUSCEPTIBILITIES PERFORMED ON PREVIOUS CULTURE WITHIN THE LAST 5 DAYS. MODERATE CORYNEBACTERIUM STRIATUM Standardized susceptibility testing for this organism is not available. NO ANAEROBES ISOLATED Performed at [facility] Lab, [address], [place], [place] [address]    REPTSTATUS 02/09/2023 FINAL 02/04/2023 1206   REPTSTATUS 02/09/2023 FINAL 02/04/2023 1206    Cardiac Enzymes: No results for input(s): "CKTOTAL", "CKMB", "CKMBINDEX", "TROPONINI" in the last 168 hours. CBG: Recent Labs  Lab 02/14/23 1225 02/15/23 0738 02/15/23 1122 02/15/23 1644  GLUCAP 102* 78 75 108*   Iron Studies:  Recent Labs    02/19/23 0259  IRON 34*  TIBC 134*   @lablastinr3 @ Studies/Results: No results found. Medications:   (feeding supplement) PROSource Plus  30 mL Oral TID BM   amiodarone  200 mg Oral Daily   amphetamine-dextroamphetamine  5 mg Oral Daily   vitamin C  1,000 mg Oral Daily   aspirin EC  81 mg Oral Daily   atorvastatin  80 mg Oral QHS   calcitRIOL  2.25 mcg Oral Q T,Th,Sat-1800   calcium acetate  1,334 mg Oral TID WC   carvedilol  12.5 mg Oral BID   cinacalcet  60 mg Oral Q T,Th,Sat-1800   darbepoetin (ARANESP) injection -  DIALYSIS  150 mcg Subcutaneous Q Thu-1800   dorzolamide-timolol  1 drop Right Eye BID   hydrocortisone   Rectal TID   levETIRAcetam  500 mg Oral Once per day on Sunday Monday Wednesday Friday   levETIRAcetam  750 mg Oral Once per day on Tuesday Thursday Saturday   multivitamin  1 tablet Oral QHS   nutrition supplement (JUVEN)  1 packet Oral BID BM   nystatin   Topical TID   psyllium  1 packet Oral BID    Dialysis Orders: [facility]  3.5h  bfr 400   85kg    2/2.5   LUA AVF    -Heparin 1800 unit initial bolus + 400u/hr - rocaltrol 2.25 three times per week - sensipar 60mg  three times per week - mircera 90 mcg q 2     Assessment/Plan: Right diabetic foot infection: status post BKA 02/09/2023, management per ortho ESRD: HD TTS on schedule , next hd tues here  HTN/volume: BP controlled, feels like too much UF was removed last HD. Will need lower EDW at d/c due to amputation Anemia: Hgb 7.9, continue weekly ESA Secondary hyperparathyroidism: Calcium controlled, phos improving. Continue calcitriol, sensipar, calcium acetate History of A-fib: not on AC due to bleeding history on amiodarone/carvedilol History of uncontrolled type 2 diabetes mellitus: mgt per primary  Rogers Blocker, PA-C 02/21/2023, 10:13 AM   [facility] Pager: [phone]

## 2023-02-21 NOTE — Progress Notes (Signed)
Contacted CSW to inquire about possible d/c date so update can be provided to out-pt HD clinic. Pt can start at clinic tomorrow if d/c today. Contacted renal PA to be advised [facility] will need orders at d/c. Will assist as needed.   Olivia Canter Renal Navigator [phone]

## 2023-02-21 NOTE — Progress Notes (Signed)
Physical Therapy Treatment Patient Details Name: [name]. MRN: [id number] DOB: Jun 13, 1970 Today's Date: 02/21/2023   History of Present Illness Pt is a 52 yr old male who presented 02/03/23 due to necrotic gangrenous R foot and ischemic changes to L heel.  Pt s/p 02/04/23 partial fourth ray amputation and resection of 5th metatarsal bone. 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 PMH ESRD  HTN, amputation L big toe, L eye absent, CVA, afib, Glaucoma, seizure,  PVD, CHF, CAD, chronic diarrhea,STEMI, osteomyelitis    PT Comments  Pt supine in bed on arrival.  He was asking for bed pan and required max cues for encouragement to participate in PT and move from bed to bedside commode.  He required max to total assistance overall.  He presents with generalized weakness.  Pt very despondent this session.  Continue to recommend rehab in a post acute setting.      If plan is discharge home, recommend the following: Two people to help with walking and/or transfers;Two people to help with bathing/dressing/bathroom;Assistance with cooking/housework;Assist for transportation;Help with stairs or ramp for entrance   Can travel by private vehicle     No  Equipment Recommendations  Wheelchair (measurements PT);Wheelchair cushion (measurements PT);Hospital bed;Hoyer lift;Other (comment);BSC/3in1 (slide board and drop arm bedside commode)    Recommendations for Other Services       Precautions / Restrictions Precautions Precautions: Fall Precaution Comments: R BKA 12/18 with limb protector; watch BP (hx of syncope); wound vac R leg Restrictions Weight Bearing Restrictions Per Provider Order: Yes RLE Weight Bearing Per Provider Order: Non weight bearing Other Position/Activity Restrictions: limb protector     Mobility  Bed Mobility Overal bed mobility: Needs Assistance   Rolling: Used rails, Contact guard assist Sidelying to sit: Max assist, HOB elevated, Min assist (placed in reverse  trend position to improve weight shifting to the R side.  Assist level varried based on balance)       General bed mobility comments: Pt able to sequence and advance LEs to edge of bed.  He required assistance for trunk elevation and scoot bottom forward to the edge if bed.    Transfers Overall transfer level: Needs assistance Equipment used: Ambulation equipment used, None Transfers: Sit to/from Stand, Bed to chair/wheelchair/BSC Sit to Stand: Total assist, +2 physical assistance (unable to achieve standing this session due to weakness.) Stand pivot transfers: Max assist, Total assist, +2 safety/equipment         General transfer comment: Pt required assistance to move from bed to bedside commode after failed attempt to stand in sara stedy due to pain.  He require max assistance to lift hips and pivot from bed to commode and commode to recliner.  He fatigues very quickly with activity. Transfer via Lift Equipment: Stedy  Ambulation/Gait                   Stairs             Wheelchair Mobility     Tilt Bed    Modified Rankin (Stroke Patients Only)       Balance Overall balance assessment: Needs assistance Sitting-balance support: Bilateral upper extremity supported, Feet supported, Single extremity supported Sitting balance-Leahy Scale: Poor       Standing balance-Leahy Scale: Poor                              Cognition Arousal: Alert Behavior During Therapy: [facility] for  tasks assessed/performed Overall Cognitive Status: Within Functional Limits for tasks assessed                                 General Comments: Required max encouragement for participation in PT tx.        Exercises      General Comments        Pertinent Vitals/Pain Pain Assessment Pain Assessment: Faces Faces Pain Scale: Hurts even more Pain Location: RLE and hemmorhoids on his bottom Pain Descriptors / Indicators: Grimacing, Guarding,  Discomfort, Operative site guarding, Moaning Pain Intervention(s): Monitored during session, Repositioned    Home Living                          Prior Function            PT Goals (current goals can now be found in the care plan section) Acute Rehab PT Goals Patient Stated Goal: to reduce pain Additional Goals Additional Goal #1: Pt will be able to propel a w/c >/= 250 ft with supervision Progress towards PT goals: Progressing toward goals    Frequency    Min 1X/week      PT Plan      Co-evaluation              AM-PAC PT "6 Clicks" Mobility   Outcome Measure  Help needed turning from your back to your side while in a flat bed without using bedrails?: A Little Help needed moving from lying on your back to sitting on the side of a flat bed without using bedrails?: A Lot Help needed moving to and from a bed to a chair (including a wheelchair)?: Total Help needed standing up from a chair using your arms (e.g., wheelchair or bedside chair)?: Total Help needed to walk in hospital room?: Total Help needed climbing 3-5 steps with a railing? : Total 6 Click Score: 9    End of Session Equipment Utilized During Treatment:  (R limb guard) Activity Tolerance: Treatment limited secondary to medical complications (Comment) Patient left: in bed;with call bell/phone within reach;with bed alarm set Nurse Communication: Mobility status PT Visit Diagnosis: Muscle weakness (generalized) (M62.81);Difficulty in walking, not elsewhere classified (R26.2);Pain;Other abnormalities of gait and mobility (R26.89) Pain - Right/Left: Right Pain - part of body: Leg     Time: 2725-3664 PT Time Calculation (min) (ACUTE ONLY): 45 min  Charges:    $Therapeutic Activity: 38-52 mins PT General Charges $$ ACUTE PT VISIT: 1 Visit                     Bonney Leitz , PTA Acute Rehabilitation Services Office [phone]    Florestine Avers 02/21/2023, 4:46 PM

## 2023-02-21 NOTE — TOC Progression Note (Signed)
Transition of Care (TOC) - Progression Note    Patient Details  Name: [name]. MRN: [id number] Date of Birth: 1970-10-18  Transition of Care [facility]) CM/SW Contact  Eduard Roux, [place] Phone Number: 02/21/2023, 2:32 PM  Clinical Narrative:      Insurance authorization for SNF- still pending    Barriers to Discharge: Insurance Authorization (clipping at local HD clinic while at SNF- in progress)  Expected Discharge Plan and Services In-house Referral: Clinical Social Work     Living arrangements for the past 2 months: Single Family Home                                       Social Determinants of Health (SDOH) Interventions SDOH Screenings   Food Insecurity: Patient Declined (02/04/2023)  Housing: Patient Declined (02/04/2023)  Transportation Needs: Patient Declined (02/04/2023)  Utilities: Patient Declined (02/04/2023)  Financial Resource Strain: High Risk (07/05/2017)  Physical Activity: Inactive (07/05/2017)  Social Connections: Moderately Integrated (07/05/2017)  Stress: Stress Concern Present (07/05/2017)  Tobacco Use: Low Risk  (02/09/2023)  Health Literacy: Low Risk  (06/01/2020)   Received from [facility]    Readmission Risk Interventions    07/27/2021   11:49 AM 11/27/2020    3:06 PM  Readmission Risk Prevention Plan  Transportation Screening Complete Complete  PCP or Specialist Appt within 3-5 Days  Complete  HRI or Home Care Consult  Complete  Social Work Consult for Recovery Care Planning/Counseling  Complete  Palliative Care Screening  Not Applicable  Medication Review Oceanographer) Complete Complete  SW Recovery Care/Counseling Consult Complete   Palliative Care Screening Not Applicable   Skilled Nursing Facility Not Applicable

## 2023-02-22 DIAGNOSIS — I96 Gangrene, not elsewhere classified: Secondary | ICD-10-CM | POA: Diagnosis not present

## 2023-02-22 LAB — RENAL FUNCTION PANEL
Albumin: 2.2 g/dL — ABNORMAL LOW (ref 3.5–5.0)
Anion gap: 18 — ABNORMAL HIGH (ref 5–15)
BUN: 137 mg/dL — ABNORMAL HIGH (ref 6–20)
CO2: 22 mmol/L (ref 22–32)
Calcium: 9 mg/dL (ref 8.9–10.3)
Chloride: 96 mmol/L — ABNORMAL LOW (ref 98–111)
Creatinine, Ser: 11.23 mg/dL — ABNORMAL HIGH (ref 0.61–1.24)
GFR, Estimated: 5 mL/min — ABNORMAL LOW (ref >60)
Glucose, Bld: 78 mg/dL (ref 70–99)
Phosphorus: 6.6 mg/dL — ABNORMAL HIGH (ref 2.5–4.6)
Potassium: 5.6 mmol/L — ABNORMAL HIGH (ref 3.5–5.1)
Sodium: 136 mmol/L (ref 135–145)

## 2023-02-22 LAB — CBC
HCT: 25.4 % — ABNORMAL LOW (ref 39.0–52.0)
Hemoglobin: 7.4 g/dL — ABNORMAL LOW (ref 13.0–17.0)
MCH: 22.6 pg — ABNORMAL LOW (ref 26.0–34.0)
MCHC: 29.1 g/dL — ABNORMAL LOW (ref 30.0–36.0)
MCV: 77.7 fL — ABNORMAL LOW (ref 80.0–100.0)
Platelets: 253 10*3/uL (ref 150–400)
RBC: 3.27 MIL/uL — ABNORMAL LOW (ref 4.22–5.81)
RDW: 20.3 % — ABNORMAL HIGH (ref 11.5–15.5)
WBC: 10.7 10*3/uL — ABNORMAL HIGH (ref 4.0–10.5)
nRBC: 0 % (ref 0.0–0.2)

## 2023-02-22 MED ORDER — MIDODRINE HCL 5 MG PO TABS
5.0000 mg | ORAL_TABLET | Freq: Three times a day (TID) | ORAL | Status: DC
  Administered 2023-02-22 – 2023-02-23 (×3): 5 mg via ORAL
  Filled 2023-02-22 (×3): qty 1

## 2023-02-22 NOTE — Plan of Care (Signed)
  Problem: Education: Goal: Knowledge of General Education information will improve Description: Including pain rating scale, medication(s)/side effects and non-pharmacologic comfort measures Outcome: Progressing   Problem: Health Behavior/Discharge Planning: Goal: Ability to manage health-related needs will improve Outcome: Progressing   Problem: Clinical Measurements: Goal: Ability to maintain clinical measurements within normal limits will improve Outcome: Progressing Goal: Will remain free from infection Outcome: Progressing   Problem: Safety: Goal: Ability to remain free from injury will improve Outcome: Progressing   Problem: Skin Integrity: Goal: Risk for impaired skin integrity will decrease Outcome: Progressing

## 2023-02-22 NOTE — TOC Progression Note (Signed)
 Transition of Care (TOC) - Progression Note    Patient Details  Name: [name]. MRN: [id number] Date of Birth: 04-16-1970  Transition [facility] Care [facility]) CM/SW Contact  Montie LOISE Louder, [place] Phone Number: 02/22/2023, 2:51 PM  Clinical Narrative:     Received insurance auth # [id number] reference # [id number] 12/31-01/03.     Barriers to Discharge: Barriers Resolved  Expected Discharge Plan and Services In-house Referral: Clinical Social Work     Living arrangements for the past 2 months: Single Family Home                                       Social Determinants of Health (SDOH) Interventions SDOH Screenings   Food Insecurity: Patient Declined (02/04/2023)  Housing: Patient Declined (02/04/2023)  Transportation Needs: Patient Declined (02/04/2023)  Utilities: Patient Declined (02/04/2023)  Financial Resource Strain: High Risk (07/05/2017)  Physical Activity: Inactive (07/05/2017)  Social Connections: Moderately Integrated (07/05/2017)  Stress: Stress Concern Present (07/05/2017)  Tobacco Use: Low Risk  (02/09/2023)  Health Literacy: Low Risk  (06/01/2020)   Received from [facility]    Readmission Risk Interventions    07/27/2021   11:49 AM 11/27/2020    3:06 PM  Readmission Risk Prevention Plan  Transportation Screening Complete Complete  PCP or Specialist Appt within 3-5 Days  Complete  HRI or Home Care Consult  Complete  Social Work Consult for Recovery Care Planning/Counseling  Complete  Palliative Care Screening  Not Applicable  Medication Review Oceanographer) Complete Complete  SW Recovery Care/Counseling Consult Complete   Palliative Care Screening Not Applicable   Skilled Nursing Facility Not Applicable

## 2023-02-22 NOTE — Progress Notes (Signed)
 [facility] Progress Note   Subjective:   Reports leg is a little sore today, no other concerns. Denies SOB, CP, dizziness, nausea.   Objective Vitals:   02/21/23 1956 02/22/23 0500 02/22/23 0552 02/22/23 0824  BP: 121/63  135/67 (!) 102/57  Pulse: (!) 59  (!) 56 (!) 54  Resp: 17  17   Temp: (!) 97.4 F (36.3 C)  97.8 F (36.6 C) 97.9 F (36.6 C)  TempSrc: Axillary  Oral Oral  SpO2: 97%  95% 97%  Weight:  80.4 kg    Height:       Physical Exam General: Alert male in NAD Heart: RRR, no murmurs, rubs or gallops Lungs: CTA bilaterally, respirations unlabored on RA Abdomen: Soft, non-distended, +BS Extremities: No edema b/l lower extremities Dialysis Access:  AVF +bruit  Additional Objective Labs: Basic Metabolic Panel: Recent Labs  Lab 02/17/23 1137 02/19/23 0256 02/21/23 0257  NA 138 138 139  K 5.0 4.9 4.5  CL 97* 99 100  CO2 24 24 24   GLUCOSE 103* 86 88  BUN 103* 93* 82*  CREATININE 10.62* 9.82* 8.73*  CALCIUM  9.0 8.9 8.6*  PHOS 6.9* 6.0*  --    Liver Function Tests: Recent Labs  Lab 02/17/23 1137 02/19/23 0256  ALBUMIN  2.0* 2.1*   No results for input(s): LIPASE, AMYLASE in the last 168 hours. CBC: Recent Labs  Lab 02/17/23 1137 02/19/23 0259 02/21/23 0257  WBC 12.8* 13.0* 10.4  HGB 7.7* 8.0* 7.9*  HCT 26.1* 27.2* 26.7*  MCV 76.5* 76.8* 76.9*  PLT 214 228 224   Blood Culture    Component Value Date/Time   SDES TISSUE 02/04/2023 1206   SDES WOUND 02/04/2023 1206   SPECREQUEST RIGHT FOOT 02/04/2023 1206   SPECREQUEST RIGHT FOOT 02/04/2023 1206   CULT  02/04/2023 1206    ABUNDANT STAPHYLOCOCCUS AUREUS MODERATE CORYNEBACTERIUM STRIATUM Standardized susceptibility testing for this organism is not available. NO ANAEROBES ISOLATED Performed at [facility], [address], [place], [place] [address]    CULT  02/04/2023 1206    ABUNDANT STAPHYLOCOCCUS AUREUS SUSCEPTIBILITIES PERFORMED ON PREVIOUS CULTURE WITHIN THE  LAST 5 DAYS. MODERATE CORYNEBACTERIUM STRIATUM Standardized susceptibility testing for this organism is not available. NO ANAEROBES ISOLATED Performed at [facility], [address], [place], [place] [address]    REPTSTATUS 02/09/2023 FINAL 02/04/2023 1206   REPTSTATUS 02/09/2023 FINAL 02/04/2023 1206    Cardiac Enzymes: No results for input(s): CKTOTAL, CKMB, CKMBINDEX, TROPONINI in the last 168 hours. CBG: Recent Labs  Lab 02/15/23 1122 02/15/23 1644  GLUCAP 75 108*   Iron Studies: No results for input(s): IRON, TIBC, TRANSFERRIN, FERRITIN in the last 72 hours. @lablastinr3 @ Studies/Results: No results found. Medications:   (feeding supplement) PROSource Plus  30 mL Oral TID BM   amiodarone   200 mg Oral Daily   amphetamine -dextroamphetamine   5 mg Oral Daily   vitamin C   1,000 mg Oral Daily   aspirin  EC  81 mg Oral Daily   atorvastatin   80 mg Oral QHS   calcitRIOL   2.25 mcg Oral Q T,Th,Sat-1800   calcium  acetate  1,334 mg Oral TID WC   carvedilol   12.5 mg Oral BID   Chlorhexidine  Gluconate Cloth  6 each Topical Q0600   cinacalcet   60 mg Oral Q T,Th,Sat-1800   darbepoetin (ARANESP ) injection - DIALYSIS  150 mcg Subcutaneous Q Thu-1800   dorzolamide -timolol   1 drop Right Eye BID   hydrocortisone    Rectal TID   levETIRAcetam   500 mg Oral Once per day on Sunday Monday Wednesday Friday   levETIRAcetam   750 mg Oral Once per day on Tuesday Thursday Saturday   multivitamin  1 tablet Oral QHS   nutrition supplement (JUVEN)  1 packet Oral BID BM   nystatin    Topical TID   psyllium  1 packet Oral BID    Dialysis Orders: [facility] TTS  3.5h  bfr 400   85kg    2/2.5   LUA AVF    -Heparin  1800 unit initial bolus + 400u/hr - rocaltrol  2.25 three times per week - sensipar  60mg  three times per week - mircera 90 mcg q 2   Has been accepted to [facility] pending SNF auth  Assessment/Plan: Right diabetic foot infection: 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 SNF pending ESRD: HD TTS on schedule, next HD today.  HTN/volume: BP controlled, feels like too much UF was removed last HD. Will need lower EDW at d/c due to amputation Anemia: Hgb 7.9, continue weekly ESA Secondary hyperparathyroidism: Calcium  controlled, phos improving. Continue calcitriol , sensipar , calcium  acetate History of A-fib: not on AC due to bleeding history on amiodarone /carvedilol  History of uncontrolled type 2 diabetes mellitus: mgt per primary    Lucie Collet, PA-C 02/22/2023, 10:12 AM  [facility] Pager: [phone]

## 2023-02-22 NOTE — Progress Notes (Signed)
Pt refused vitals. RN educated pt on the importance of taking his vitals.

## 2023-02-22 NOTE — Progress Notes (Signed)
 Physical Therapy Treatment Patient Details Name: [name]. MRN: [id number] DOB: 06-23-1970 Today's Date: 02/22/2023   History of Present Illness Pt is a 52 yr old male who presented 02/03/23 due to necrotic gangrenous R foot and ischemic changes to L heel.  Pt s/p 02/04/23 partial fourth ray amputation and resection of 5th metatarsal bone. 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 PMH ESRD  HTN, amputation L big toe, L eye absent, CVA, afib, Glaucoma, seizure,  PVD, CHF, CAD, chronic diarrhea,STEMI, osteomyelitis    PT Comments  Pt is progressing towards goals. Due to holiday weekend pt has not been seen as frequently. Pt was very motivated to participate in skilled physical therapy services and gave good effort throughout. Pt was Mod-Min A for bed mobility, Mod-Max A +2 for squat pivot transfer from EOB to chair. Pt attempted standing EOB but due to LLE weakness and impaired balance secondary to recent surgery unable to achieve standing this date with maximum pt effort. Due to pt current functional status, home set up and available assistance at home recommending skilled physical therapy services < 3 hours/day in order to address strength, balance and functional mobility to decrease risk for falls, injury, immobility, skin break down and re-hospitalization.      If plan is discharge home, recommend the following: Two people to help with walking and/or transfers;Assistance with cooking/housework;Assist for transportation;Help with stairs or ramp for entrance   Can travel by private vehicle     No  Equipment Recommendations  Other (comment) (defer to post acute)       Precautions / Restrictions Precautions Precautions: Fall Precaution Comments: R BKA 12/18 with limb protector; watch BP (hx of syncope); wound vac R leg Restrictions Weight Bearing Restrictions Per Provider Order: Yes RLE Weight Bearing Per Provider Order: Non weight bearing Other Position/Activity Restrictions: limb  protector     Mobility  Bed Mobility Overal bed mobility: Needs Assistance Bed Mobility: Supine to Sit, Sit to Supine   Sidelying to sit: Mod assist Supine to sit: Min assist     General bed mobility comments: Mod A for supine to sitting EOB with trunk to mid line and RLE due to weakness. Initially pt had to lay back down due to dizziness BP 97/51. Pt back to supine at Min A with assist with the RLE; good upper body control. Pt sat EOB again at Min - MOd A with assist with RLE and min A at trunk to get to mid line.    Transfers Overall transfer level: Needs assistance Equipment used: 2 person hand held assist Transfers: Bed to chair/wheelchair/BSC Sit to Stand: +2 physical assistance, Mod assist, Max assist     Squat pivot transfers: Mod assist, Max assist, +2 physical assistance     General transfer comment: Mod to Max +2 physical assist for squat pivot transfers from EOB to recliner. Pt gave good effort with assist with UE and WB through the LLE with limb protector in place. Pt was able to get ~25-50% of wgt through the LLE but due to weakness and dizziness was unable to fully stand through the LLE. Pt gave good effort following all directions and motivated to participate. Attempted standing EOB but due to LLE weakness pt unable to push through quad to obtain full erect standing    Ambulation/Gait   General Gait Details: unable to progress at this time due to weakness and impaired balance.      Balance Overall balance assessment: Needs assistance Sitting-balance support: Bilateral upper  extremity supported, Feet supported, Single extremity supported Sitting balance-Leahy Scale: Fair Sitting balance - Comments: no LOB sitting EOB.       Standing balance comment: Attempted sit to stand but pt unable due to LLE weakness.        Cognition Arousal: Alert Behavior During Therapy: WFL for tasks assessed/performed Overall Cognitive Status: Within Functional Limits for tasks  assessed         General Comments: pt pleasant and cooperative.           General Comments General comments (skin integrity, edema, etc.): EOB 97/51 MAP 65 after transfer sitting in chair 94/42 with MAP 57 After sitting in chair 3 min 97/53 MAP 65 again pt is symptomatic with nausea, dizziness and was reporting loss of vision after transfer. MD and RN made ware. Pt w/improving symptoms at end of session in reclined position.      Pertinent Vitals/Pain Pain Assessment Pain Assessment: 0-10 Pain Score: 8  Pain Location: R foot intermittently with movement. Pain Descriptors / Indicators: Burning, Cramping, Squeezing Pain Intervention(s): Monitored during session, Repositioned     PT Goals (current goals can now be found in the care plan section) Acute Rehab PT Goals Patient Stated Goal: to reduce pain PT Goal Formulation: With patient/family Time For Goal Achievement: 02/24/23 Potential to Achieve Goals: Fair Additional Goals Additional Goal #1: Pt will be able to propel a w/c >/= 250 ft with supervision Progress towards PT goals: Progressing toward goals    Frequency    Min 3X/week      PT Plan  Continue with current POC        AM-PAC PT 6 Clicks Mobility   Outcome Measure  Help needed turning from your back to your side while in a flat bed without using bedrails?: A Little Help needed moving from lying on your back to sitting on the side of a flat bed without using bedrails?: A Lot Help needed moving to and from a bed to a chair (including a wheelchair)?: A Lot Help needed standing up from a chair using your arms (e.g., wheelchair or bedside chair)?: Total Help needed to walk in hospital room?: Total Help needed climbing 3-5 steps with a railing? : Total 6 Click Score: 10    End of Session Equipment Utilized During Treatment: Gait belt Activity Tolerance: Patient tolerated treatment well Patient left: with call bell/phone within reach;in chair Nurse  Communication: Mobility status PT Visit Diagnosis: Muscle weakness (generalized) (M62.81);Difficulty in walking, not elsewhere classified (R26.2);Pain;Other abnormalities of gait and mobility (R26.89) Pain - Right/Left: Right Pain - part of body: Leg     Time: 8942-8864 PT Time Calculation (min) (ACUTE ONLY): 38 min  Charges:    $Therapeutic Activity: 38-52 mins PT General Charges $$ ACUTE PT VISIT: 1 Visit                     Dorothyann Maier, DPT, CLT  Acute Rehabilitation Services Office: [phone] (Secure chat preferred)    Dorothyann VEAR Maier 02/22/2023, 12:03 PM

## 2023-02-22 NOTE — Plan of Care (Signed)
  Problem: Education: Goal: Knowledge of General Education information will improve Description: Including pain rating scale, medication(s)/side effects and non-pharmacologic comfort measures Outcome: Progressing   Problem: Health Behavior/Discharge Planning: Goal: Ability to manage health-related needs will improve Outcome: Progressing   Problem: Clinical Measurements: Goal: Ability to maintain clinical measurements within normal limits will improve Outcome: Progressing Goal: Will remain free from infection Outcome: Progressing Goal: Diagnostic test results will improve Outcome: Progressing Goal: Respiratory complications will improve Outcome: Progressing Goal: Cardiovascular complication will be avoided Outcome: Progressing   Problem: Activity: Goal: Risk for activity intolerance will decrease Outcome: Progressing   Problem: Nutrition: Goal: Adequate nutrition will be maintained Outcome: Progressing   Problem: Coping: Goal: Level of anxiety will decrease Outcome: Progressing   Problem: Elimination: Goal: Will not experience complications related to bowel motility Outcome: Progressing   Problem: Pain Management: Goal: General experience of comfort will improve Outcome: Progressing   Problem: Safety: Goal: Ability to remain free from injury will improve Outcome: Progressing   Problem: Skin Integrity: Goal: Risk for impaired skin integrity will decrease Outcome: Progressing   Problem: Education: Goal: Knowledge of the prescribed therapeutic regimen will improve Outcome: Progressing Goal: Ability to verbalize activity precautions or restrictions will improve Outcome: Progressing Goal: Understanding of discharge needs will improve Outcome: Progressing   Problem: Activity: Goal: Ability to perform//tolerate increased activity and mobilize with assistive devices will improve Outcome: Progressing   Problem: Clinical Measurements: Goal: Postoperative  complications will be avoided or minimized Outcome: Progressing   Problem: Self-Care: Goal: Ability to meet self-care needs will improve Outcome: Progressing   Problem: Self-Concept: Goal: Ability to maintain and perform role responsibilities to the fullest extent possible will improve Outcome: Progressing   Problem: Pain Management: Goal: Pain level will decrease with appropriate interventions Outcome: Progressing   Problem: Skin Integrity: Goal: Demonstration of wound healing without infection will improve Outcome: Progressing

## 2023-02-22 NOTE — Progress Notes (Signed)
   02/22/23 2201  Vitals  Temp 98.2 F (36.8 C)  Temp Source Oral  BP (!) 142/71  MAP (mmHg) 93  BP Location Right Arm  BP Method Automatic  Patient Position (if appropriate) Lying  Pulse Rate (!) 54  Pulse Rate Source Monitor  ECG Heart Rate (!) 56  Resp 17  Oxygen Therapy  SpO2 100 %  During Treatment Monitoring  Blood Flow Rate (mL/min) 399 mL/min  Arterial Pressure (mmHg) -186.25 mmHg  Venous Pressure (mmHg) 185.04 mmHg  TMP (mmHg) 6.46 mmHg  Ultrafiltration Rate (mL/min) 555 mL/min  Dialysate Flow Rate (mL/min) 300 ml/min  Dialysate Potassium Concentration 3  Dialysate Calcium  Concentration 2.5  Duration of HD Treatment -hour(s) 1.77 hour(s)  Cumulative Fluid Removed (mL) per Treatment  -281.92  HD Safety Checks Performed Yes  Intra-Hemodialysis Comments Tx completed  Post Treatment  Dialyzer Clearance Lightly streaked  Liters Processed 44.1  Fluid Removed (mL) 200 mL  Post-Hemodialysis Comments  (pt request to D/C dialysis)  AVG/AVF Arterial Site Held (minutes) 10 minutes  AVG/AVF Venous Site Held (minutes) 10 minutes  Note  Patient Observations pt stable  Fistula / Graft Left Upper arm  No placement date or time found.   Orientation: Left  Access Location: Upper arm  Site Condition No complications  Fistula / Graft Assessment Thrill;Bruit  Status Deaccessed  Needle Size 15  Drainage Description None   Pt strongly request to discontinue treatment. The risk of shortening treatment was explained. Pre.

## 2023-02-22 NOTE — TOC Progression Note (Signed)
 Transition of Care (TOC) - Progression Note    Patient Details  Name: [name]. MRN: [id number] Date of Birth: 02-04-1971  Transition of Care [facility]) CM/SW Contact  Montie LOISE Louder, [place] Phone Number: 02/22/2023, 11:07 AM  Clinical Narrative:     P2P was requested by insurance and completed by MD- awaiting decision      Barriers to Discharge: Insurance Authorization (clipping at local HD clinic while at SNF- in progress)  Expected Discharge Plan and Services In-house Referral: Clinical Social Work     Living arrangements for the past 2 months: Single Family Home                                       Social Determinants of Health (SDOH) Interventions SDOH Screenings   Food Insecurity: Patient Declined (02/04/2023)  Housing: Patient Declined (02/04/2023)  Transportation Needs: Patient Declined (02/04/2023)  Utilities: Patient Declined (02/04/2023)  Financial Resource Strain: High Risk (07/05/2017)  Physical Activity: Inactive (07/05/2017)  Social Connections: Moderately Integrated (07/05/2017)  Stress: Stress Concern Present (07/05/2017)  Tobacco Use: Low Risk  (02/09/2023)  Health Literacy: Low Risk  (06/01/2020)   Received from [facility]    Readmission Risk Interventions    07/27/2021   11:49 AM 11/27/2020    3:06 PM  Readmission Risk Prevention Plan  Transportation Screening Complete Complete  PCP or Specialist Appt within 3-5 Days  Complete  HRI or Home Care Consult  Complete  Social Work Consult for Recovery Care Planning/Counseling  Complete  Palliative Care Screening  Not Applicable  Medication Review Oceanographer) Complete Complete  SW Recovery Care/Counseling Consult Complete   Palliative Care Screening Not Applicable   Skilled Nursing Facility Not Applicable

## 2023-02-22 NOTE — Progress Notes (Addendum)
 Nutrition Follow-up  DOCUMENTATION CODES:   Not applicable  INTERVENTION:   -Continue regular diet with encouragement.  -Continue Prosource Plus TID, each supplement provides 100 calories and 15 gm protein.  -Continue -1 packet Juven BID, each packet provides 95 calories, 2.5 grams of protein (collagen), and 9.8 grams of carbohydrate (3 grams sugar); also contains 7 grams of L-arginine and L-glutamine, 300 mg vitamin C , 15 mg vitamin E, 1.2 mcg vitamin B-12, 9.5 mg zinc , 200 mg calcium , and 1.5 g  Calcium  Beta-hydroxy-Beta-methylbutyrate to support wound healing  -Continue micronutrient supplementation of vitamin C , zinc  sulfate, renal MVI to support wound healing.   NUTRITION DIAGNOSIS:   Increased nutrient needs related to post-op healing as evidenced by estimated needs.  -ongoing, addressing with meals and oral supplements  GOAL:   Patient will meet greater than or equal to 90% of their needs  -progressing  MONITOR:     REASON FOR ASSESSMENT:   Follow up  ASSESSMENT:   Pt with medical history significant of type 2 diabetes mellitus, acute osteomyelitis of toe of right foot with gangrene status post amputation of the great toe on the right foot, paroxysmal atrial fibrillation not on anticoagulation, hypertension, ESRD on HD, seizure disorder, BPH, diabetic polyneuropathy, GERD, gastroparesis, hypertension.  Per patient and his wife who is at the bedside, patient's wife noted a wound on his right foot on 01/31/23.  12/12- MRI concerning for osteomyelitis 12/13- s/p partial fourth ray amputation of rt foot, further resection of fifth metatarsal bone of rt foot, application of antibiotic beads of rt foot 12/14- per podiatry extensive loss of soft tissue on plantar and lateral portion of rt foot and WBC not improving; orthopedics consulted 12/16- angiogram cancelled due to decreased responsiveness after HD; scrotum macerated with denuded skin- nystatin  and lidocaine  ordered  by MD; per VVS, rt leg not salvageable due to severe tissue loss and lt leg with small, superficial ulceration on heel but also concern for ischemia (plan for angiography) 12/17- s/p angiogram- revealed inline flow to the foot with three-vessel runoff bilaterally but with microvascular disease and minimal filling pedal vessels bilaterally  12/18- s/p rt BKA with application of mirco graft, wound vac, and stump shrinker 12/19- s/p BSE- regular consistency diet with thin liquids    Intakes recorded average 28% x 7 meals. Notes he does not care for lack of flavor of meals with the modified diet, reminded that he is on a regular diet now. He feels nausea when he is upright. Encouraged patient to communicate with RN when he feels nauseated. He does not recall what he ate for B, at L had roast beef.  Intakes are not consistently recorded. Need to obtain more detailed diet hx when his wife is present to determine if he meets criteria for malnutrition. States he was eating just fine at home, prior to admission. Notes he is taking the Prosource and Juven, RN confirmed.  Discussed importance of eating well in the hospital to maintain lean body mass.  Current weight down 4# from admit, monitor. Edema non-pitting to RLE, LLE.  Medications reviewed and include vitamin C  1,000 mg daily, calcitriol , phoslo , cinacalcet , Darbepoetin Alfa , Rena-vit-1 Tab daily, psyllium.  Labs: 12/30: BUN 82, creatinine 8.73, GFR 7  NUTRITION - FOCUSED PHYSICAL EXAM:  Flowsheet Row Most Recent Value  Orbital Region No depletion  Upper Arm Region No depletion  Thoracic and Lumbar Region No depletion  Buccal Region No depletion  Temple Region Mild depletion  Clavicle Bone Region Moderate depletion  Clavicle and Acromion Bone Region Moderate depletion  Scapular Bone Region Unable to assess  Dorsal Hand Moderate depletion  Patellar Region Moderate depletion  Anterior Thigh Region Moderate depletion  Posterior Calf Region  Moderate depletion  Edema (RD Assessment) None  Hair Reviewed  Eyes Reviewed  [pallor inner eye lids]  Mouth Reviewed  Skin Reviewed  Nails Reviewed       Diet Order:   Diet Order             Diet regular Room service appropriate? No; Fluid consistency: Thin  Diet effective now                   EDUCATION NEEDS:   Not appropriate for education at this time  Skin:  Skin Assessment: Skin Integrity Issues: Skin Integrity Issues:: Stage II, Wound VAC, Incisions Stage II: sacrum Wound Vac: R leg Incisions: R BKA  Last BM:  02/21/2023  Height:   Ht Readings from Last 1 Encounters:  02/09/23 6' 3 (1.905 m)    Weight:   Wt Readings from Last 1 Encounters:  02/22/23 80.4 kg    Ideal Body Weight:  83.3 kg (adjusted for rt BKA)  BMI:  Body mass index is 22.15 kg/m.  Estimated Nutritional Needs:   Kcal:  2300-2500  Protein:  125-150 grams  Fluid:  1000 ml + UOP    Elveria Sable, RDLD Clinical Dietitian If unable to reach, please contact RD Inpatient secure chat group between 8 am-4 pm daily

## 2023-02-22 NOTE — Care Management Important Message (Signed)
Important Message  Patient Details  Name: [name]. MRN: [id number] Date of Birth: Oct 12, 1970   Important Message Given:  Yes - Medicare IM     Renie Ora 02/22/2023, 10:20 AM

## 2023-02-22 NOTE — Progress Notes (Addendum)
 Advised staff at [facility] late yesterday afternoon that pt would not start today. Insurance auth for snf was still pending yesterday per CSW. Will assist as needed.   Randine Mungo Renal Navigator [phone]  Addendum at 4:25 pm: Advised pt will likely d/c to snf tomorrow. Contacted [facility] to be advised of pt's likely d/c date and that pt should start on Friday. Contacted renal PA to request that orders be sent to clinic at d/c. Added out-pt HD arrangements to AVS as well.

## 2023-02-22 NOTE — Progress Notes (Signed)
 Progress Note   Patient: [name]. FMW:[id number] DOB: [birth year] DOA: 02/03/2023     19 DOS: the patient was seen and examined on 02/22/2023 at 10:32AM      Brief hospital course: 52 y.o. M with DM, HTN, ESRD on HD, pAF not on AC, gastroparesis, and hx foot ulcer right foot who presented from podiatry clinic for worsening ulcer, found to have osteomyelitis, now s/p BKA.  Admitted on 12/12 for gangrenous wound on the right foot, MRI showed osteomyelitis.  Vascular surgery consult and he underwent aortogram on 12/17 that showed microvascular disease, no discrete intervenable lesion, and BKA was recommended.  Underwent BKA on 12/18 by Dr. Harden. Postoperative course complicated by anemia and delays arranging outpatient dialysis and was insurance authorization with consequent deconditioning.        Assessment and Plan: * Gangrene of right foot (HCC) S/P BKA (below knee amputation), right (HCC) - 02-09-2023 - Continue wound VAC - Wound VAC will be removed at the time of discharge and transition to 4 x 4's with a stump shrinker - Follow-up with Dr. Harden within 2 weeks of discharge     PAD (peripheral artery disease) (HCC) -Continue low-dose aspirin , Lipitor    History of seizure No seizures observed - Continue Keppra    History of CVA (cerebrovascular accident) - with ICH while on plavix  and Eliquis  in 11-2021. Coronary artery disease without angina -Continue aspirin , Lipitor , Coreg    H/O spontaneous intraventricular intracranial hemorrhage 10/2021 - while on plavix  and Eliquis    ESRD on hemodialysis [facility]) -Consult nephrology for maintenance HD   Anemia due to chronic kidney disease, on chronic dialysis (HCC) Hemoglobin stable ~8 g/dL, no clinical bleeding observed.  No transfusions needed this hospital stay   Paroxysmal atrial fibrillation (HCC) - not on systemic anticoagulation due to ICH while on Eliquis  in 11-2021. - Continue amiodarone , carvedilol     Uncontrolled type 2 diabetes mellitus with hyperglycemia, with long-term current use of insulin  (HCC) Glucose controlled - Holding insulin    Essential hypertension, benign Blood pressure normal - Continue carvedilol    ADD - Continue Adderall   Diabetic hypoglycemia (HCC) Resolved                  Subjective: No new concerns.  No nursing concerns.         Physical Exam: BP (!) 102/57 (BP Location: Right Arm)   Pulse (!) 54   Temp 97.9 F (36.6 C) (Oral)   Resp 17   Ht 6' 3 (1.905 m)   Wt 80.4 kg   SpO2 97%   BMI 22.15 kg/m   Adult male, lying in bed, appears weak and tired RRR, no murmurs, no peripheral edema Respiratory rate normal, lungs clear without rales or wheezes Abdomen soft without tenderness palpation or guarding Oriented to person, place, and situation.  Psychomotor slowing is mild.  Generalized weakness.         Disposition: Status is: Inpatient At baseline the patient lives with his wife, his modified dependent, can stand with assistance and walks with a walker without help.  Post BKA, he is obviously requiring assistance for bed mobility, transfers, will need significant rehabilitation return to his prior level of strength and independence.  At present, he is deconditioned and that is limiting his mobility to this point, but he was up in the chair for several hours yesterday, and is motivated to work with physical therapy.  He does not have any pain, focal weakness, anemia, or medical disease limiting his physical movement.  Author: Lonni SHAUNNA Dalton, MD 02/22/2023 10:39 AM  For on call review [email or web address].

## 2023-02-23 MED ORDER — AMPHETAMINE-DEXTROAMPHET ER 5 MG PO CP24: 5.0000 mg | ORAL_CAPSULE | Freq: Every day | ORAL | 0 refills | Status: DC

## 2023-02-23 MED ORDER — PROSOURCE PLUS PO LIQD: 30.0000 mL | Freq: Three times a day (TID) | ORAL | Status: DC

## 2023-02-23 MED ORDER — ASPIRIN 81 MG PO TBEC: 81.0000 mg | DELAYED_RELEASE_TABLET | Freq: Every day | ORAL | Status: DC

## 2023-02-23 MED ORDER — JUVEN PO PACK: 1.0000 | PACK | Freq: Two times a day (BID) | ORAL | Status: DC

## 2023-02-23 MED ORDER — RENA-VITE PO TABS: 1.0000 | ORAL_TABLET | Freq: Every day | ORAL | Status: DC

## 2023-02-23 MED ORDER — CARVEDILOL 12.5 MG PO TABS: 12.5000 mg | ORAL_TABLET | Freq: Two times a day (BID) | ORAL | Status: DC

## 2023-02-23 MED ORDER — LOPERAMIDE HCL 2 MG PO CAPS: 2.0000 mg | ORAL_CAPSULE | ORAL | Status: DC | PRN

## 2023-02-23 MED ORDER — ASCORBIC ACID 1000 MG PO TABS: 1000.0000 mg | ORAL_TABLET | Freq: Every day | ORAL | Status: DC

## 2023-02-23 MED ORDER — LEVETIRACETAM 750 MG PO TABS: 750.0000 mg | ORAL_TABLET | ORAL | Status: DC

## 2023-02-23 MED ORDER — LEVETIRACETAM 500 MG PO TABS: 500.0000 mg | ORAL_TABLET | ORAL | Status: DC

## 2023-02-23 MED ORDER — HYDROCODONE-ACETAMINOPHEN 5-325 MG PO TABS: 1.0000 | ORAL_TABLET | Freq: Four times a day (QID) | ORAL | 0 refills | Status: DC | PRN

## 2023-02-23 MED ORDER — CALCITRIOL 0.25 MCG PO CAPS: 2.2500 ug | ORAL_CAPSULE | ORAL | Status: DC

## 2023-02-23 MED ORDER — CINACALCET HCL 30 MG PO TABS: 60.0000 mg | ORAL_TABLET | ORAL | Status: DC

## 2023-02-23 MED ORDER — MIDODRINE HCL 5 MG PO TABS: 5.0000 mg | ORAL_TABLET | Freq: Three times a day (TID) | ORAL | Status: DC

## 2023-02-23 NOTE — Progress Notes (Signed)
 [facility] Progress Note   Subjective:   Pt signed off HD early yesterday, reports he was just tired. Denies SOB, CP, nausea. PMD reported he was getting dizziness with sitting yesterday. Today patient reports it happens intermittently. Has been started on midodrine .   Objective Vitals:   02/22/23 2201 02/22/23 2345 02/23/23 0302 02/23/23 0932  BP: (!) 142/71 (!) 154/70 (!) 124/58 139/68  Pulse: (!) 54 60 60 (!) 53  Resp: 17 16 16 18   Temp: 98.2 F (36.8 C) 98.1 F (36.7 C) 97.8 F (36.6 C) 98.1 F (36.7 C)  TempSrc: Oral Oral Oral Oral  SpO2: 100% 100% 100% 97%  Weight:      Height:       Physical Exam General: Alert male in NAD Heart: RRR, no murmurs, rubs or gallops Lungs: CTA bilaterally, respirations unlabored on RA Abdomen: Soft, non-distended, +BS Extremities: No edema b/l lower extremities Dialysis Access:  AVF +bruit  Additional Objective Labs: Basic Metabolic Panel: Recent Labs  Lab 02/17/23 1137 02/19/23 0256 02/21/23 0257 02/22/23 1955  NA 138 138 139 136  K 5.0 4.9 4.5 5.6*  CL 97* 99 100 96*  CO2 24 24 24 22   GLUCOSE 103* 86 88 78  BUN 103* 93* 82* 137*  CREATININE 10.62* 9.82* 8.73* 11.23*  CALCIUM  9.0 8.9 8.6* 9.0  PHOS 6.9* 6.0*  --  6.6*   Liver Function Tests: Recent Labs  Lab 02/17/23 1137 02/19/23 0256 02/22/23 1955  ALBUMIN  2.0* 2.1* 2.2*   No results for input(s): LIPASE, AMYLASE in the last 168 hours. CBC: Recent Labs  Lab 02/17/23 1137 02/19/23 0259 02/21/23 0257 02/22/23 1955  WBC 12.8* 13.0* 10.4 10.7*  HGB 7.7* 8.0* 7.9* 7.4*  HCT 26.1* 27.2* 26.7* 25.4*  MCV 76.5* 76.8* 76.9* 77.7*  PLT 214 228 224 253   Blood Culture    Component Value Date/Time   SDES TISSUE 02/04/2023 1206   SDES WOUND 02/04/2023 1206   SPECREQUEST RIGHT FOOT 02/04/2023 1206   SPECREQUEST RIGHT FOOT 02/04/2023 1206   CULT  02/04/2023 1206    ABUNDANT STAPHYLOCOCCUS AUREUS MODERATE CORYNEBACTERIUM STRIATUM Standardized  susceptibility testing for this organism is not available. NO ANAEROBES ISOLATED Performed at [facility], [address] [place], [place] [address]    CULT  02/04/2023 1206    ABUNDANT STAPHYLOCOCCUS AUREUS SUSCEPTIBILITIES PERFORMED ON PREVIOUS CULTURE WITHIN THE LAST 5 DAYS. MODERATE CORYNEBACTERIUM STRIATUM Standardized susceptibility testing for this organism is not available. NO ANAEROBES ISOLATED Performed at [facility], [address] [place], [place] [address]    REPTSTATUS 02/09/2023 FINAL 02/04/2023 1206   REPTSTATUS 02/09/2023 FINAL 02/04/2023 1206    Cardiac Enzymes: No results for input(s): CKTOTAL, CKMB, CKMBINDEX, TROPONINI in the last 168 hours. CBG: No results for input(s): GLUCAP in the last 168 hours. Iron Studies: No results for input(s): IRON, TIBC, TRANSFERRIN, FERRITIN in the last 72 hours. @lablastinr3 @ Studies/Results: No results found. Medications:   (feeding supplement) PROSource Plus  30 mL Oral TID BM   amiodarone   200 mg Oral Daily   amphetamine -dextroamphetamine   5 mg Oral Daily   vitamin C   1,000 mg Oral Daily   aspirin  EC  81 mg Oral Daily   atorvastatin   80 mg Oral QHS   calcitRIOL   2.25 mcg Oral Q T,Th,Sat-1800   calcium  acetate  1,334 mg Oral TID WC   carvedilol   12.5 mg Oral BID   Chlorhexidine  Gluconate Cloth  6 each Topical Q0600   cinacalcet   60 mg Oral Q T,Th,Sat-1800   darbepoetin (ARANESP ) injection - DIALYSIS  150 mcg Subcutaneous Q Thu-1800   dorzolamide -timolol   1 drop Right Eye BID   hydrocortisone    Rectal TID   levETIRAcetam   500 mg Oral Once per day on Sunday Monday Wednesday Friday   levETIRAcetam   750 mg Oral Once per day on Tuesday Thursday Saturday   midodrine   5 mg Oral TID WC   multivitamin  1 tablet Oral QHS   nutrition supplement (JUVEN)  1 packet Oral BID BM   nystatin    Topical TID   psyllium  1 packet Oral BID    Dialysis Orders: [facility] TTS  3.5h  bfr 400    85kg    2/2.5   LUA AVF    -Heparin  1800 unit initial bolus + 400u/hr - rocaltrol  2.25 three times per week - sensipar  60mg  three times per week - mircera 90 mcg q 2   Has been accepted to [facility] pending SNF auth  Assessment/Plan: Right diabetic foot infection: status post BKA 02/09/2023, management per ortho. Planning to d/c to SNF today ESRD: HD TTS on schedule, next HD tomorrow outpatient  HTN/volume: BP controlled, getting some dizziness so minimal UF with HD. Will need lower EDW at d/c due to amputation. Euvolemic on exam Anemia: Hgb 7.4, continue weekly ESA Secondary hyperparathyroidism: Calcium  controlled, phos improving. Continue calcitriol , sensipar , calcium  acetate History of A-fib: not on AC due to bleeding history on amiodarone /carvedilol  History of uncontrolled type 2 diabetes mellitus: mgt per primary    Lucie Collet, PA-C 02/23/2023, 11:50 AM   [facility] Pager: [phone]

## 2023-02-23 NOTE — TOC Transition Note (Signed)
 Transition of Care [facility]) - Discharge Note   Patient Details  Name: [name]. MRN: [id number] Date of Birth: 04-06-1970  Transition of Care [facility]) CM/SW Contact:  Montie LOISE Louder, LCSW Phone Number: 02/23/2023, 10:47 AM   Clinical Narrative:     Patient will Discharge to: [facility] Discharge Date: 02/23/2023 Family Notified: spouse Transport By: ROME  Per MD patient is ready for discharge. RN, patient, and facility notified of discharge. Discharge Summary sent to facility. RN given number for [phone] option 0, room 213. Ambulance transport requested for patient.   Clinical Social Worker signing off.  Montie Louder, MSW, LCSW Clinical Social Worker       Barriers to Discharge: Barriers Resolved   Patient Goals and CMS Choice            Discharge Placement                       Discharge Plan and Services Additional resources added to the After Visit Summary for   In-house Referral: Clinical Social Work                                   Social Drivers of Health (SDOH) Interventions SDOH Screenings   Food Insecurity: Patient Declined (02/04/2023)  Housing: Patient Declined (02/04/2023)  Transportation Needs: Patient Declined (02/04/2023)  Utilities: Patient Declined (02/04/2023)  Financial Resource Strain: High Risk (07/05/2017)  Physical Activity: Inactive (07/05/2017)  Social Connections: Moderately Integrated (07/05/2017)  Stress: Stress Concern Present (07/05/2017)  Tobacco Use: Low Risk  (02/09/2023)  Health Literacy: Low Risk  (06/01/2020)   Received from [facility]     Readmission Risk Interventions    07/27/2021   11:49 AM 11/27/2020    3:06 PM  Readmission Risk Prevention Plan  Transportation Screening Complete Complete  PCP or Specialist Appt within 3-5 Days  Complete  HRI or Home Care Consult  Complete  Social Work Consult for Recovery Care Planning/Counseling  Complete   Palliative Care Screening  Not Applicable  Medication Review Oceanographer) Complete Complete  SW Recovery Care/Counseling Consult Complete   Palliative Care Screening Not Applicable   Skilled Nursing Facility Not Applicable

## 2023-02-23 NOTE — Progress Notes (Addendum)
 Occupational Therapy Treatment Patient Details Name: [name]. MRN: [id number] DOB: 1970/12/03 Today's Date: 02/23/2023   History of present illness Pt is a 53 yr old male who presented 02/03/23 due to necrotic gangrenous R foot and ischemic changes to L heel.  Pt s/p 02/04/23 partial fourth ray amputation and resection of 5th metatarsal bone. 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 PMH ESRD  HTN, amputation L big toe, L eye absent, CVA, afib, Glaucoma, seizure,  PVD, CHF, CAD, chronic diarrhea,STEMI, osteomyelitis   OT comments  Pt making slow progress towards OT goals but hopeful for continued improvement with inpatient rehab stay at [facility]. Focused on bed mobility, EOB balance and transfer attempts. Pt requires Mod A for bed mobility and unsuccessful with standing attempts in [place] today despite +2 assist. Reinforced purpose of limb protector, other exercises that wife can assist pt with and general trajectory of rehab. Pt reporting wooziness and vision changes with activity though BP stable (100s/50-60s).      If plan is discharge home, recommend the following:  Two people to help with walking and/or transfers;Two people to help with bathing/dressing/bathroom;Assistance with cooking/housework;Assistance with feeding;Direct supervision/assist for medications management;Direct supervision/assist for financial management;Assist for transportation;Supervision due to cognitive status   Equipment Recommendations  Hospital bed;Wheelchair cushion (measurements OT);Wheelchair (measurements OT)    Recommendations for Other Services      Precautions / Restrictions Precautions Precautions: Fall Precaution Comments: R BKA 12/18 with limb protector; watch BP (hx of syncope); wound vac R leg Restrictions Weight Bearing Restrictions Per Provider Order: Yes RLE Weight Bearing Per Provider Order: Non weight bearing       Mobility Bed Mobility Overal bed mobility: Needs Assistance Bed  Mobility: Supine to Sit, Sit to Supine, Rolling Rolling: Min assist   Supine to sit: Mod assist Sit to supine: Mod assist, +2 for safety/equipment   General bed mobility comments: able to assist LE towards EOB and reach to bedrail to lift trunk but extra assist needed to lift trunk and scoot bottom. use of footboard for stability. +2 for safety to return to bed due to sitting close to edge after standing attempts    Transfers Overall transfer level: Needs assistance Equipment used: Ambulation equipment used Transfers: Sit to/from Stand             General transfer comment: attempted standing in [place] multiple times with pt able to clear bottom from bed but unable to push through LLE enough to stand fully upright Transfer via Lift Equipment: [place]   Balance Overall balance assessment: Needs assistance Sitting-balance support: Feet supported, No upper extremity supported Sitting balance-Leahy Scale: Fair     Standing balance support: Bilateral upper extremity supported, During functional activity Standing balance-Leahy Scale: Zero                             ADL either performed or assessed with clinical judgement   ADL Overall ADL's : Needs assistance/impaired                     Lower Body Dressing: Total assistance;Bed level       Toileting- Clothing Manipulation and Hygiene: Total assistance;Bed level Toileting - Clothing Manipulation Details (indicate cue type and reason): pt reported he felt he needed to be cleaned up. OT provided peri care though very minimal bowel noted       General ADL Comments: Focused on EOB attempts and standing attempts  Extremity/Trunk Assessment Upper Extremity Assessment Upper Extremity Assessment: Generalized weakness;Right hand dominant;LUE deficits/detail LUE Deficits / Details: hx of CVA with increased weakness, able to reach to stedy bar with increased time   Lower Extremity Assessment Lower Extremity  Assessment: Defer to PT evaluation        Vision   Vision Assessment?: No apparent visual deficits   Perception     Praxis      Cognition Arousal: Alert Behavior During Therapy: WFL for tasks assessed/performed Overall Cognitive Status: Impaired/Different from baseline Area of Impairment: Problem solving, Awareness                           Awareness: Emergent Problem Solving: Slow processing, Difficulty sequencing, Requires verbal cues General Comments: pt pleasant and cooperative, increased time to sequence and problem solve tasks        Exercises      Shoulder Instructions       General Comments Wife at bedside, supportive and has been helping pt with exercises    Pertinent Vitals/ Pain       Pain Assessment Pain Assessment: Faces Faces Pain Scale: Hurts little more Pain Location: R LE Pain Descriptors / Indicators: Sore, Grimacing, Guarding Pain Intervention(s): Monitored during session, Limited activity within patient's tolerance  Home Living                                          Prior Functioning/Environment              Frequency  Min 1X/week        Progress Toward Goals  OT Goals(current goals can now be found in the care plan section)  Progress towards OT goals: OT to reassess next treatment  Acute Rehab OT Goals Patient Stated Goal: progress quickly with rehab OT Goal Formulation: With patient Time For Goal Achievement: 02/24/23 Potential to Achieve Goals: Fair ADL Goals Pt Will Perform Eating: with modified independence;with adaptive utensils Pt Will Perform Grooming: with modified independence;sitting Pt Will Perform Upper Body Bathing: sitting;with min assist Pt Will Perform Lower Body Bathing: with max assist;sitting/lateral leans Pt Will Transfer to Toilet: with max assist;bedside commode  Plan      Co-evaluation                 AM-PAC OT 6 Clicks Daily Activity     Outcome  Measure   Help from another person eating meals?: A Little Help from another person taking care of personal grooming?: A Lot Help from another person toileting, which includes using toliet, bedpan, or urinal?: Total Help from another person bathing (including washing, rinsing, drying)?: Total Help from another person to put on and taking off regular upper body clothing?: A Lot Help from another person to put on and taking off regular lower body clothing?: Total 6 Click Score: 10    End of Session Equipment Utilized During Treatment: Gait belt  OT Visit Diagnosis: Unsteadiness on feet (R26.81);Other abnormalities of gait and mobility (R26.89);Repeated falls (R29.6);Muscle weakness (generalized) (M62.81);Pain;Hemiplegia and hemiparesis Hemiplegia - Right/Left: Left Hemiplegia - dominant/non-dominant: Dominant Pain - Right/Left: Right Pain - part of body: Leg   Activity Tolerance Patient tolerated treatment well;Patient limited by fatigue   Patient Left in bed;with call bell/phone within reach;with bed alarm set;with family/visitor present   Nurse Communication Mobility status  Time: 9041-8973 OT Time Calculation (min): 28 min  Charges: OT General Charges $OT Visit: 1 Visit OT Treatments $Self Care/Home Management : 8-22 mins $Therapeutic Activity: 8-22 mins  Mliss NOVAK, OTR/L Acute Rehab Services Office: [phone]   Mliss Getting 02/23/2023, 11:31 AM

## 2023-02-23 NOTE — Discharge Planning (Addendum)
 [facility]  Initial Hemodialysis Orders  Dialysis center: [facility]  Patient's name: [name]. DOB: [birth year] AKI or ESRD: ESRD  Discharge diagnosis: Gangrene of R foot s/p BKA 2. ESRD  Allergies:  Allergies  Allergen Reactions   Promethazine  Diarrhea and Other (See Comments)    Muscle cramps   Promethazine  Hcl Other (See Comments)    Cramping all over  Other reaction(s): cramp all over   Other     Other reaction(s): Unknown Other reaction(s): Unknown    Date of First Dialysis: unknown- on HD prior to admission Cause of renal disease: diabetes mellitus  Dialysis Prescription: Dialysis Frequency: TIW Tx duration: 3.5 hours  BFR: 400 DFR: 800  EDW: 80.5kg  Dialyzer: 180NRe UF profile/Sodium modeling?: no Dialysis Bath: 2 K 2 Ca  Dialysis access: Access type: AVF Date placed: 05/31/19 Surgeon: Dr. Selinda Gu Needle gauge: 15g  In Center Medications: Heparin  Dose: 2000 unit bolus  Type: pork VDRA: none  Venofer: none Mircera: 200mcg IV q 2 weeks Next dose due: 02/24/23 Sensipar : non  Discharge labs: Hgb: 7.4 K+: 5.6  Ca: 9.0  Phos: 6.6 Alb: 2.2  Please draw routine labs. Additional labs needed: none Additional notes/follow-up: discharging to SNF  Lucie Collet, PA-C 02/23/2023, 12:00 PM  [facility] Pager: [phone]

## 2023-02-23 NOTE — Discharge Summary (Addendum)
 Physician Discharge Summary   Patient: [name]. MRN: [id number] DOB: 1971/01/29  Admit date:     02/03/2023  Discharge date: 02/23/23  Discharge Physician: Lonni SHAUNNA Dalton   PCP: [name], FNP     Recommendations at discharge:  Follow up with Orthopedics Dr. Harden in 1 week from discharge to SNF Please schedule Neurology follow up for history of seizures and ?reduce/taper Keppra      Discharge Diagnoses: Principal Problem:   Gangrene of right foot [facility]) Active Problems:   S/P BKA (below knee amputation), right [facility]) - 02-09-2023   Diabetic hypoglycemia (HCC)   PAD (peripheral artery disease) (HCC)   Essential hypertension, benign   Uncontrolled type 2 diabetes mellitus with hyperglycemia, with long-term current use of insulin  (HCC)   Paroxysmal atrial fibrillation (HCC) - not on systemic anticoagulation due to ICH while on Eliquis  in 11-2021.   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   History of seizure      Hospital Course:  53 y.o. M with DM, HTN, ESRD on HD, pAF not on AC, gastroparesis, and hx foot ulcer right foot who presented from podiatry clinic for worsening ulcer, found to have osteomyelitis, now s/p BKA.   Admitted on 12/12 for gangrenous wound on the right foot, MRI showed osteomyelitis.  Vascular surgery consult and he underwent aortogram on 12/17 that showed microvascular disease, no discrete intervenable lesion, and BKA was recommended.   Underwent BKA on 12/18 by Dr. Harden. Postoperative course complicated by anemia and delays arranging outpatient dialysis and was insurance authorization with consequent deconditioning.               Gangrene of right foot  (HCC) S/P BKA (below knee amputation), right (HCC) - 02-09-2023 Wound VAC to be removed at the time of discharge and transition to 4 x 4's with a stump shrinker. Change 4x4 gauze daily. - Follow-up with Dr. Harden within 1 week of discharge       PAD (peripheral artery disease) (HCC) Peripheral vascular disease History of spontaneous intraventricular hemorrhage Essential hypertension Coronary artery disease On low-dose aspirin , Lipitor , Coreg    History of seizure No seizures observed here.  Continue Keppra  lower dose on non-dialysis days, 750 on dialysis days.  Recommend Neuro follow up   ESRD on hemodialysis (HCC)   Anemia due to chronic kidney disease, on chronic dialysis (HCC) Hemoglobin stable ~8 g/dL, no clinical bleeding observed.  No transfusions needed this hospital stay   Paroxysmal atrial fibrillation (HCC) - not on systemic anticoagulation due to ICH while on Eliquis  in 11-2021. On amiodarone , carvedilol , stable.   Uncontrolled type 2 diabetes mellitus with hyperglycemia, with long-term current use of insulin  (HCC) Insulins held.   ADD On Adderall   Diabetic hypoglycemia (HCC) Resolved                 The [place]  Controlled Substances Registry was reviewed for this patient prior to discharge.  Consultants:  Vascular surgery Orthopedics  Procedures performed:  Angiography of the right leg Right BKA  Disposition: Skilled nursing facility Diet recommendation:  Cardiac and Carb modified diet  DISCHARGE MEDICATION: Allergies as of 02/23/2023       Reactions   Promethazine  Diarrhea, Other (See Comments)  Muscle cramps   Promethazine  Hcl Other (See Comments)   Cramping all over Other reaction(s): cramp all over   Other    Other reaction(s): Unknown Other reaction(s): Unknown        Medication List     STOP taking these medications    amLODipine  5 MG tablet Commonly known as: NORVASC    hydrALAZINE  25 MG tablet Commonly known  as: APRESOLINE    nitroGLYCERIN  0.4 MG SL tablet Commonly known as: NITROSTAT    polyethylene glycol 17 g packet Commonly known as: MIRALAX  / GLYCOLAX        TAKE these medications    (feeding supplement) PROSource Plus liquid Take 30 mLs by mouth 3 (three) times daily between meals.   nutrition supplement (JUVEN) Pack Take 1 packet by mouth 2 (two) times daily between meals.   acetaminophen  325 MG tablet Commonly known as: TYLENOL  Take 1-2 tablets (325-650 mg total) by mouth every 4 (four) hours as needed for mild pain.   amiodarone  200 MG tablet Commonly known as: PACERONE  Take 1 tablet (200 mg total) by mouth every morning.   ammonium lactate  12 % cream Commonly known as: AMLACTIN Apply 1 Application topically as needed for dry skin.   amphetamine -dextroamphetamine  5 MG 24 hr capsule Commonly known as: ADDERALL XR Take 1 capsule (5 mg total) by mouth daily.   ascorbic acid  1000 MG tablet Commonly known as: VITAMIN C  Take 1 tablet (1,000 mg total) by mouth daily. Start taking on: February 24, 2023   aspirin  EC 81 MG tablet Take 1 tablet (81 mg total) by mouth daily. Swallow whole. Start taking on: February 24, 2023   atorvastatin  80 MG tablet Commonly known as: LIPITOR  TAKE ONE TABLET BY MOUTH AT BEDTIME.   calcitRIOL  0.25 MCG capsule Commonly known as: ROCALTROL  Take 9 capsules (2.25 mcg total) by mouth every Tuesday, Thursday, and Saturday at 6 PM. Start taking on: February 24, 2023   calcium  acetate 667 MG capsule Commonly known as: PHOSLO  Take 2 capsules (1,334 mg total) by mouth 3 (three) times daily with meals.   carvedilol  12.5 MG tablet Commonly known as: COREG  Take 1 tablet (12.5 mg total) by mouth 2 (two) times daily. What changed:  medication strength how much to take   cinacalcet  30 MG tablet Commonly known as: SENSIPAR  Take 2 tablets (60 mg total) by mouth every Tuesday, Thursday, and Saturday at 6 PM. Start taking on: February 24, 2023    dorzolamide -timolol  2-0.5 % ophthalmic solution Commonly known as: COSOPT  Place 1 drop into the right eye 2 (two) times daily.   fluticasone  50 MCG/ACT nasal spray Commonly known as: FLONASE  Place 2 sprays into both nostrils daily.   HYDROcodone -acetaminophen  5-325 MG tablet Commonly known as: NORCO/VICODIN Take 1 tablet by mouth every 6 (six) hours as needed for moderate pain (pain score 4-6).   hydrocortisone  2.5 % rectal cream Commonly known as: ANUSOL -HC Place 1 Application rectally 2 (two) times daily.   levETIRAcetam  750 MG tablet Commonly known as: KEPPRA  Take 1 tablet (750 mg total) by mouth 3 (three) times a week. On Tuesday Thursday and Saturday, on dialysis days Start taking on: February 24, 2023 What changed:  medication strength how much to take when to take this additional instructions   levETIRAcetam  500 MG tablet Commonly known as: KEPPRA  Take 1 tablet (500 mg total) by mouth 4 (four) times a week. On Sunday, Monday, Wednesday and Friday, all non-dialysis days Start taking on: February 25, 2023 What changed:  medication strength how much  to take when to take this additional instructions These instructions start on February 25, 2023. If you are unsure what to do until then, ask your doctor or other care provider.   lidocaine -prilocaine  cream Commonly known as: EMLA  Apply topically.   loperamide  2 MG capsule Commonly known as: IMODIUM  Take 1 capsule (2 mg total) by mouth as needed for diarrhea or loose stools.   midodrine  5 MG tablet Commonly known as: PROAMATINE  Take 1 tablet (5 mg total) by mouth 3 (three) times daily with meals.   multivitamin Tabs tablet Take 1 tablet by mouth at bedtime.   psyllium 58.6 % packet Commonly known as: METAMUCIL Take 1 packet by mouth 2 (two) times daily.               Discharge Care Instructions  (From admission, onward)           Start     Ordered   02/23/23 0000  Discharge wound care:        Comments: Cover with foam dressing Change once daily or with soiling Have wound care nurse re-evaluate in 1 week Mar 01 2023   02/23/23 0827   02/23/23 0000  Discharge wound care:       Comments: Remove wound vac at discharge Apply 4x4 gauze to wound Cover with stump shrinker Change gauze daily   02/23/23 9061            Follow-up Information     Harden Jerona GAILS, MD Follow up in 2 week(s).   Specialty: Orthopedic Surgery Contact information: [address] [place] [address] [phone]         [facility] Follow up.   Specialty: Vascular Surgery Why: As needed, If symptoms worsen Contact information: [address] [facility] [facility] [place]  [address] [phone]        [facility]. Go on 02/25/2023.   Why: Schedule is Monday, Wednesday, Friday with 10:55 am chair time.  Patient can start on Friday, Jan 3 and will need to arrive at 9:55 am for paperwork prior to treatment. Please send hoyer pad under pt in w/c to HD. Contact information: [address] [place] [address] [phone]                 Discharge Instructions     Discharge wound care:   Complete by: As directed    Cover with foam dressing Change once daily or with soiling Have wound care nurse re-evaluate in 1 week Mar 01 2023   Discharge wound care:   Complete by: As directed    Remove wound vac at discharge Apply 4x4 gauze to wound Cover with stump shrinker Change gauze daily       Discharge Exam: Filed Weights   02/19/23 0813 02/19/23 1157 02/22/23 0500  Weight: 82.5 kg 81.7 kg 80.4 kg    General: Pt is alert, awake, not in acute distress, tired, lying in bed Cardiovascular: RRR, nl S1-S2, no murmurs appreciated.   No LE edema.   Respiratory: Normal respiratory rate and rhythm.  CTAB without rales or wheezes. Abdominal: Abdomen soft and non-tender.  No distension or HSM.   Neuro/Psych: Strength symmetric in  upper extremities but quite weak.  Judgment and insight appear normal, left eye closed.  Speech fluent, oriented and appropriate.   Condition at discharge: poor  The results of significant diagnostics from this hospitalization (including imaging, microbiology, ancillary and laboratory) are listed below for reference.   Imaging  Studies: PERIPHERAL VASCULAR CATHETERIZATION Result Date: 02/09/2023 Images from the original result were not included.   Patient name: Keron Sovine Jr.  MRN: [id number]        DOB: 09/06/1970            Sex: male  02/03/2023 - 02/08/2023 Pre-operative Diagnosis: Bilateral foot wounds Post-operative diagnosis:  Same Surgeon:  Norman GORMAN Serve, MD Procedure Performed:  Ultrasound-guided access of right common femoral artery Second-order cannulation of left external iliac artery Aortogram and bilateral lower extremity angiogram Closure of right common femoral artery with Mynx closure device  Indications: 54 year old male with a significant history of type 2 diabetes, A-fib, HTN, ESRD on HD who is status post toe amputation on the right foot which has been nonhealing with recurrent infection and osteomyelitis.  He is planned to undergo right below the knee amputation with Dr. Harden on 02/09/2023 and was noted to have discoloration and possibly ischemic skin changes to the left heel.  Risks and benefits of angiogram with possible intervention were reviewed, he expressed understanding and is willing to proceed.  Findings: Widely patent aorta and bilateral renal arteries.  Widely patent bilateral iliac systems.   Left common femoral, profunda and SFA are widely patent without flow-limiting stenosis.  The popliteal artery and all 3 tibial vessels are patent but with medial calcinosis, no flow-limiting stenosis.  Three-vessel runoff to the foot.  There is microvascular disease in the foot and the pedal arch is noted to be absent.  Right common femoral, found and SFA are widely patent without  flow-limiting stenosis.  The popliteal artery and all 3 tibial vessels are patent with medial calcinosis, no flow-limiting stenosis.  Three-vessel runoff to the foot but there is microvascular disease with minimal filling in pedal vessels.             Procedure:  The patient was identified in the holding area and taken to the cath lab  The patient was then placed supine on the table and prepped and draped in the usual sterile fashion.  A time out was called.  Ultrasound was used to evaluate the right common femoral artery.  It was patent .  A digital ultrasound image was acquired.  A micropuncture needle was used to access the right common femoral artery under ultrasound guidance.  An 018 wire was advanced without resistance and a micropuncture sheath was placed.  The 018 wire was removed and a benson wire was placed.  The micropuncture sheath was exchanged for a 5 french sheath.  An omniflush catheter was advanced over the wire to the level of L-1.  An abdominal angiogram was obtained.  Next, using the omniflush catheter and a glide advantage wire, the aortic bifurcation was crossed and the catheter was placed into theleft external iliac artery and left runoff was obtained. This demonstrated the above findings. right runoff was performed via retrograde sheath injections which demonstrated the above findings.  Contrast: 95 cc  Impression: Inline flow to the foot with three-vessel runoff bilaterally but with microvascular disease and minimal filling pedal vessels bilaterally.   Norman GORMAN Serve MD [facility] [place] Office: [phone]   CT HEAD WO CONTRAST ( ) Result Date: 02/07/2023 CLINICAL DATA:  Delirium EXAM: CT HEAD WITHOUT CONTRAST TECHNIQUE: Contiguous axial images were obtained from the base of the skull through the vertex without intravenous contrast. RADIATION DOSE REDUCTION: This exam was performed according to the departmental dose-optimization program which includes  automated exposure control, adjustment of the mA  and/or kV according to patient size and/or use of iterative reconstruction technique. COMPARISON:  None Available. FINDINGS: Brain: Bilateral chronic superior frontal lobe infarcts. No mass, hemorrhage or extra-axial collection. No hydrocephalus. Vascular: Calcific atherosclerosis at the skull base. Skull: Negative Sinuses/Orbits: Paranasal sinuses are clear.  Absent left lobe. Other: None IMPRESSION: 1. No acute intracranial abnormality. 2. Chronic bilateral superior frontal lobe infarcts. Electronically Signed   By: Franky Stanford M.D.   On: 02/07/2023 23:23   VAS US  ABI WITH/WO TBI Result Date: 02/07/2023  LOWER EXTREMITY DOPPLER STUDY Patient Name:  CASHAWN YANKO.  Date of Exam:   02/07/2023 Medical Rec #: [id number]             Accession #:    [id number] Date of Birth: 12/28/1970              Patient Gender: M Patient Age:   64 years Exam Location:  [facility] Procedure:      VAS US  ABI WITH/WO TBI Referring Phys: HARLENE BOWL --------------------------------------------------------------------------------  Indications: PVD, A-fib High Risk Factors: Hypertension, Diabetes.  Comparison Study: Previous study on 1.23.2018. Performing Technologist: Edilia Elden Appl  Examination Guidelines: A complete evaluation includes at minimum, Doppler waveform signals and systolic blood pressure reading at the level of bilateral brachial, anterior tibial, and posterior tibial arteries, when vessel segments are accessible. Bilateral testing is considered an integral part of a complete examination. Photoelectric Plethysmograph (PPG) waveforms and toe systolic pressure readings are included as required and additional duplex testing as needed. Limited examinations for reoccurring indications may be performed as noted.  ABI Findings: +---------+------------------+-----+----------+--------+ Right    Rt Pressure (mmHg)IndexWaveform  Comment   +---------+------------------+-----+----------+--------+ Brachial 141                    biphasic           +---------+------------------+-----+----------+--------+ PTA      255               1.81 monophasic         +---------+------------------+-----+----------+--------+ DP       255               1.81 triphasic          +---------+------------------+-----+----------+--------+ Great Toe0                 0.00 Abnormal           +---------+------------------+-----+----------+--------+ +---------+------------------+-----+----------+----------------+ Left     Lt Pressure (mmHg)IndexWaveform  Comment          +---------+------------------+-----+----------+----------------+ Brachial                                  Dialysis access. +---------+------------------+-----+----------+----------------+ PTA      255               1.81 monophasic                 +---------+------------------+-----+----------+----------------+ DP       154               1.09 triphasic                  +---------+------------------+-----+----------+----------------+ Great Toe0                 0.00 Abnormal                   +---------+------------------+-----+----------+----------------+ Arterial wall calcification  precludes accurate ankle pressures and ABIs.  Summary: Right: Resting right ankle-brachial index indicates noncompressible right lower extremity arteries. The right toe-brachial index is abnormal. Left: Resting left ankle-brachial index indicates noncompressible left lower extremity arteries. The left toe-brachial index is abnormal. *See table(s) above for measurements and observations.  Electronically signed by Fonda Rim on 02/07/2023 at 6:51:11 PM.    Final    DG Foot 2 Views Right Result Date: 02/04/2023 CLINICAL DATA:  Postoperative state.  Diabetic foot ulcer. EXAM: RIGHT FOOT - 2 VIEW COMPARISON:  Right foot radiographs 02/03/2023, MRI left forefoot FINDINGS: Interval  revision amputation of the fifth digit, now to the proximal shaft of fifth metatarsal. New amputation of the fourth digit to the proximal shaft of the metatarsal. There are antibiotic beads overlying the prominently plantar aspect of the second through fourth metatarsals. There is plantar midfoot soft tissue irregularity with overlying bandage material. High-grade vascular calcifications. IMPRESSION: 1. Interval revision amputation of the fifth digit, now to the proximal shaft of the fifth metatarsal. 2. New amputation of the fourth digit to the proximal shaft of the metatarsal. 3. Antibiotic beads overlying the prominently plantar aspect of the second through fourth metatarsals. 4. No unexpected postsurgical findings. Electronically Signed   By: Tanda Lyons M.D.   On: 02/04/2023 15:27   MR FOOT RIGHT WO CONTRAST Result Date: 02/03/2023 CLINICAL DATA:  Soft tissue infection. EXAM: MRI OF THE RIGHT FOREFOOT WITHOUT CONTRAST TECHNIQUE: Multiplanar, multisequence MR imaging of the forefoot was performed. No intravenous contrast was administered. COMPARISON:  Radiographs 02/03/2023 FINDINGS: Despite efforts by the technologist and patient, motion artifact is present on today's exam and could not be eliminated. This reduces exam sensitivity and specificity. Bones/Joint/Cartilage Previous small toe amputation at the MTP joint. Cortical irregularity and mild marrow edema in the head of the fifth metatarsal suspicious for early osteomyelitis. Ligaments Lisfranc ligament intact Muscles and Tendons Substantial fluid/edema tracking within along the quadratus plantae,, abductor hallucis, flexor digitorum brevis, third and fourth plantar and dorsal interosseous, and abductor digiti minimi muscles, confluent with subcutaneous edema which is primarily infiltrative but which may have small fluid pockets tracking dorsal lateral to the fifth metatarsal, dorsal to the fourth metatarsal and into the fourth toe, and in the plantar  subcutaneous tissues below the distal fourth and fifth metatarsals. Appearance concerning for active infection. Soft tissues Subcutaneous edema and fluid contiguous with intramuscular and fascia plane collections in the forefoot as noted above. IMPRESSION: 1. Substantial fluid/edema tracking within along the quadratus plantae, abductor hallucis, flexor digitorum brevis, third and fourth plantar and dorsal interosseous, and abductor digiti minimi muscles, confluent with subcutaneous edema which is primarily infiltrative but which may have small fluid pockets tracking dorsal lateral to the fifth metatarsal, dorsal to the fourth metatarsal and into the fourth toe, and in the plantar subcutaneous tissues below the distal fourth and fifth metatarsals. Appearance concerning for active infection. 2. Cortical irregularity and mild marrow edema in the head of the fifth metatarsal suspicious for early osteomyelitis. 3. Previous small toe amputation at the MTP joint. Electronically Signed   By: Ryan Salvage M.D.   On: 02/03/2023 19:38   DG Foot Complete Right Result Date: 02/03/2023 Please see detailed radiograph report in office note.  DG Foot Complete Right Result Date: 02/03/2023 CLINICAL DATA:  Right foot wound EXAM: RIGHT FOOT COMPLETE - 3 VIEW COMPARISON:  Earlier same day right foot radiographs, 10/06/2022 FINDINGS: Postsurgical changes from small toe amputation. Diffuse osteopenia. Extensive soft tissue vascular calcifications. Dressing  material overlying the lateral forefoot, likely over the reported wound. No discrete soft tissue abnormality is seen. No definite cortical abnormality of the lateral forefoot. IMPRESSION: 1. Postsurgical changes from small toe amputation. No definite cortical abnormality of the lateral forefoot. 2. Diffuse osteopenia. Electronically Signed   By: Limin  Xu M.D.   On: 02/03/2023 12:16    Microbiology: Results for orders placed or performed during the hospital encounter  of 02/03/23  Culture, blood (routine x 2)     Status: None   Collection Time: 02/03/23 12:22 PM   Specimen: BLOOD RIGHT ARM  Result Value Ref Range Status   Specimen Description BLOOD RIGHT ARM  Final   Special Requests   Final    BOTTLES DRAWN AEROBIC AND ANAEROBIC Blood Culture results may not be optimal due to an inadequate volume of blood received in culture bottles   Culture   Final    NO GROWTH 5 DAYS Performed at [facility], [address], [place], [place] [address]    Report Status 02/08/2023 FINAL  Final  Culture, blood (routine x 2)     Status: None   Collection Time: 02/03/23 12:27 PM   Specimen: BLOOD RIGHT HAND  Result Value Ref Range Status   Specimen Description BLOOD RIGHT HAND  Final   Special Requests   Final    BOTTLES DRAWN AEROBIC AND ANAEROBIC Blood Culture results may not be optimal due to an inadequate volume of blood received in culture bottles   Culture   Final    NO GROWTH 5 DAYS Performed at [facility], [address] [place], [place] [address]    Report Status 02/08/2023 FINAL  Final  Aerobic/Anaerobic Culture w Gram Stain (surgical/deep wound)     Status: None   Collection Time: 02/04/23 12:06 PM   Specimen: Foot, Right; Tissue  Result Value Ref Range Status   Specimen Description TISSUE  Final   Special Requests RIGHT FOOT  Final   Gram Stain   Final    FEW WBC PRESENT, PREDOMINANTLY PMN ABUNDANT GRAM POSITIVE COCCI IN PAIRS RARE GRAM NEGATIVE COCCOBACILLI    Culture   Final    ABUNDANT STAPHYLOCOCCUS AUREUS MODERATE CORYNEBACTERIUM STRIATUM Standardized susceptibility testing for this organism is not available. NO ANAEROBES ISOLATED Performed at [facility], [address], [place], [place] [address]    Report Status 02/09/2023 FINAL  Final   Organism ID, Bacteria STAPHYLOCOCCUS AUREUS  Final      Susceptibility   Staphylococcus aureus - MIC*    CIPROFLOXACIN <=0.5 SENSITIVE Sensitive     ERYTHROMYCIN  RESISTANT  Resistant     GENTAMICIN  <=0.5 SENSITIVE Sensitive     OXACILLIN <=0.25 SENSITIVE Sensitive     TETRACYCLINE <=1 SENSITIVE Sensitive     VANCOMYCIN  1 SENSITIVE Sensitive     TRIMETH /SULFA  <=10 SENSITIVE Sensitive     CLINDAMYCIN  RESISTANT Resistant     RIFAMPIN <=0.5 SENSITIVE Sensitive     Inducible Clindamycin  POSITIVE Resistant     LINEZOLID 2 SENSITIVE Sensitive     * ABUNDANT STAPHYLOCOCCUS AUREUS  Aerobic/Anaerobic Culture w Gram Stain (surgical/deep wound)     Status: None   Collection Time: 02/04/23 12:06 PM   Specimen: Foot, Right; Wound  Result Value Ref Range Status   Specimen Description WOUND  Final   Special Requests RIGHT FOOT  Final   Gram Stain   Final    MODERATE WBC PRESENT, PREDOMINANTLY PMN MODERATE GRAM POSITIVE COCCI    Culture  Final    ABUNDANT STAPHYLOCOCCUS AUREUS SUSCEPTIBILITIES PERFORMED ON PREVIOUS CULTURE WITHIN THE LAST 5 DAYS. MODERATE CORYNEBACTERIUM STRIATUM Standardized susceptibility testing for this organism is not available. NO ANAEROBES ISOLATED Performed at [facility], [address], [place], [place] [address]    Report Status 02/09/2023 FINAL  Final  Surgical pcr screen     Status: Abnormal   Collection Time: 02/08/23  8:05 PM   Specimen: Nasal Mucosa; Nasal Swab  Result Value Ref Range Status   MRSA, PCR NEGATIVE NEGATIVE Final   Staphylococcus aureus POSITIVE (A) NEGATIVE Final    Comment: (NOTE) The Xpert SA Assay (FDA approved for NASAL specimens in patients 28 years of age and older), is one component of a comprehensive surveillance program. It is not intended to diagnose infection nor to guide or monitor treatment. Performed at [facility], [address] [place], [place] [address]     Labs: CBC: Recent Labs  Lab 02/17/23 1137 02/19/23 0259 02/21/23 0257 02/22/23 1955  WBC 12.8* 13.0* 10.4 10.7*  HGB 7.7* 8.0* 7.9* 7.4*  HCT 26.1* 27.2* 26.7* 25.4*  MCV 76.5* 76.8* 76.9* 77.7*  PLT 214 228 224  253   Basic Metabolic Panel: Recent Labs  Lab 02/17/23 1137 02/19/23 0256 02/21/23 0257 02/22/23 1955  NA 138 138 139 136  K 5.0 4.9 4.5 5.6*  CL 97* 99 100 96*  CO2 24 24 24 22   GLUCOSE 103* 86 88 78  BUN 103* 93* 82* 137*  CREATININE 10.62* 9.82* 8.73* 11.23*  CALCIUM  9.0 8.9 8.6* 9.0  PHOS 6.9* 6.0*  --  6.6*   Liver Function Tests: Recent Labs  Lab 02/17/23 1137 02/19/23 0256 02/22/23 1955  ALBUMIN  2.0* 2.1* 2.2*   CBG: No results for input(s): GLUCAP in the last 168 hours.  Discharge time spent: approximately 25 minutes spent on discharge counseling, evaluation of patient on day of discharge, and coordination of discharge planning with nursing, social work, pharmacy and case management  Signed: Lonni SHAUNNA Dalton, MD [facility] 02/23/2023

## 2023-02-24 ENCOUNTER — Telehealth (INDEPENDENT_AMBULATORY_CARE_PROVIDER_SITE_OTHER): Payer: Self-pay | Admitting: Gastroenterology

## 2023-02-24 NOTE — Telephone Encounter (Signed)
 PA for TCS approved via [employer]

## 2023-02-24 NOTE — Progress Notes (Signed)
 Late Note Entry- Feb 24, 2023  Pt was d/c yesterday. Contacted [facility] this morning to be advised of pt's d/c date and that pt should start tomorrow as planned.   Olivia Canter Renal Navigator [phone]

## 2023-02-25 ENCOUNTER — Telehealth (HOSPITAL_COMMUNITY): Payer: Self-pay | Admitting: Nephrology

## 2023-02-25 NOTE — Telephone Encounter (Signed)
 Paged by [facility] -- pt brought from SNF to HD unit for 1st treatment. He said he was too weak to sign the admission paperwork. Wife called - woken up - she refused to come in to sign for him. BP 101/50, HR 54. Afebrile. RN assessed him, discussed with clinic manager - unable to run him without consents signed. Plan is to send back to SNF and then have brought back to ED for dialysis. Renal navigator aware - will follow. Inpatient nephrology team notified to watch for him.   Izetta Boehringer, PA-C [employer] Pager [phone]

## 2023-02-27 NOTE — Progress Notes (Deleted)
 Cardiology Office Note    Patient Name: [name]. Date of Encounter: 02/27/2023  Primary Care Provider:  [name], FNP Primary Cardiologist:  Candyce Reek, MD Primary Electrophysiologist: None   Past Medical History    Past Medical History:  Diagnosis Date   Acute Bil  CVAs of Bil Frontal amd Rt Parietal  in Watershed Distribution 04/21/2021   Patchy acute cortically-based infarcts within the bilateral high  frontal lobes and right parietal lobe (predominantly in a watershed  distribution).   Subcentimeter acute infarct within the callosal splenium on the  left.     Acute bilateral cerebral infarction in a watershed distribution [facility]) 04/21/2021   a.) MRI brain 04/21/2021 --> patchy ACUTE cortically-based infarcts within the bilateral high frontal lobes and right parietal lobe   Acute cerebral infarction (HCC) 02/13/2021   a.) MRI brain 02/13/2021: acute LEFT hippocampal infarct   Acute cerebral infarction (HCC) 04/21/2021   a.) MRI brain 04/21/2021: subcentimeter ACUTE infarct within the callosal splenium on the LEFT   Acute osteomyelitis of toe of right foot with gangrene (HCC) 02/26/2022   Anaphylactic reaction due to adverse effect of correct drug or medicament properly administered, initial encounter 05/08/2019   Anemia of chronic renal failure    Anxiety    Aortic dilatation (HCC) 11/25/2020   a.) TTE 11/25/2020: Ao root measured 38 mm. b.) TTE 04/22/2021: Ao root measured 44 mm; c. 11/2021 Echo: Ao root 40mm.   Atrial fibrillation (HCC)    a.) CHA2DS2-VASc = 6 (CHF, HTN, CVA x2, prior MI, T2DM). b.) rate/rhythm maintained on oral amiodarone  + carvedilol ; chronic OAC/AP therapy discontinued following ICH   Benign prostatic hyperplasia with lower urinary tract symptoms 11/07/2019   BPH (benign prostatic hyperplasia)    Cardiac arrest (HCC) 07/26/2021   a.) in setting of hyperkalemia, NSTEMI, and seizure following missed HD; pulseless and apneic --> required  1 round of CPR prior to ROSC   Cellulitis    Cerebral hemorrhage (HCC) 11/21/2021   a.) CT head 11/21/2021 --> 1.4 x 1.5 x 3.1 cm ACUTE hemorrhage within the inferior aspect of the fourth ventricle, and extending inferiorly through the foramen of foramen of [place] --> occurred in setting of HTN emergency and DOAC/APT-->eliquis /plavix  d/c'd.   Cerebral microvascular disease    Chronic diarrhea    Chronic heart failure with preserved ejection fraction (HFpEF) (HCC) 07/19/2017   a.) 06/2017 Echo: EF 75%; b.) 11/2020 Echo: EF 55-60%; c.) 04/2021 Echo: EF 55-60%; d.) 11/2021 Echo: EF 65-70%, GrII DD, nl RV fxn, sev dil LA, large circumferential pericardial eff w/o tamponade, triv MR, AoV sclerosis. Ao root 40mm; e.) TTE 02/28/2025: EF 60-65%, sev LVH, mod posterior LV effusion, AoV sclerosis   Chronic pain of both ankles    Coronary artery disease 02/26/2020   a. 02/2020 Ant STEMI/PCI: LM nl, LAD 56m (3.5x30 Resolute Onyx), RI nl, LCX min irregs, RCA min irregs. EF 65%.   DDD (degenerative disc disease), cervical    Diabetes mellitus, type II (HCC)    Diabetic neuropathy (HCC)    Esophagitis    ESRD (end stage renal disease) on dialysis (HCC)    a.) [facility]; T-Th-Sat   Frequent falls    Gait instability    Gastritis    Gastroparesis    GERD (gastroesophageal reflux disease)    H/O enucleation of left eyeball    Heart palpitations    History of kidney stones    HLD (hyperlipidemia)    Hypertension    Insomnia  a.) uses melatonin PRN   Long term current use of amiodarone     Nausea and vomiting in adult    recurrent   NSTEMI (non-ST elevated myocardial infarction) (HCC) 07/26/2021   a.) hyperkalemic from missed HD; seizures; decompensated to cardiac arrest and required CPR x 1 round prior to ROSC.   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  Added automatically from request for surgery [id number]      Osteomyelitis [facility])    Pericardial effusion    a. 11/2021 Echo: EF 65-70%, GrII DD, nl RV fxn, sev dil LA, large circumferential pericardial eff w/o tamponade, triv MR, AoV sclerosis. Ao root 40mm.   Seizure (HCC)    a.) last 07/26/2021 in setting of missed HD --> hyperkalemic at 6.5 --> pulseless/apneic and required CPR; discharged home on levetiracetam .   ST elevation myocardial infarction (STEMI) of anterior wall (HCC) 02/26/2020   a.) LHC/PCI 02/26/2020 --> EF 65%; LVEDP 11 mmHg; 90% mLAD (3.5 x 20 mm Resolute Onyx DES x 1)    History of Present Illness  [name].  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   [name] was last seen in office on 10/08/2022 and reported experiencing hypotension during dialysis.  He was also dealing with gastroparesis and severe diarrhea.  He was advised to hold antihypertensive regimen until postdialysis to avoid hypotension.  Rate controlled and tolerating anticoagulation without any bleeding.  He underwent repeat 2D echo to evaluate status of pericardial effusion that showed normal EF with severe LVH and stable effusion with no growth since previous echo.  He underwent colonoscopy in 01/27/2023 but was canceled due to poor bowel prep.  He was admitted on 02/03/2023 due to worsening right foot pain.  He was found to have a gangrenous right foot wound with MRI confirming osteomyelitis.  He underwent an aortogram 12/17 that showed microvascular disease with no discrete lesion and BKA was recommended and performed on 02/09/2023.  He was discharged to SNF presents today for follow-up.   During today's visit the patient reports*** .  Patient denies chest pain, palpitations, dyspnea, PND,  orthopnea, nausea, vomiting, dizziness, syncope, edema, weight gain, or early satiety.  ***Notes: -Last ischemic evaluation: -Last echo: -Interim ED visits: Review of Systems  Please see the history of present illness.    All other systems reviewed and are otherwise negative except as noted above.  Physical Exam    Wt Readings from Last 3 Encounters:  02/22/23 177 lb 4 oz (80.4 kg)  01/27/23 182 lb 1.6 oz (82.6 kg)  01/25/23 182 lb 1.6 oz (82.6 kg)   CD:Uyzmz were no vitals filed for this visit.,There is no height or weight on file to calculate BMI. GEN: Well nourished, well developed in no acute distress Neck: No JVD; No carotid bruits Pulmonary: Clear to auscultation without rales, wheezing or rhonchi  Cardiovascular: Normal rate. Regular rhythm. Normal S1. Normal S2.   Murmurs: There is no murmur.  ABDOMEN: Soft, non-tender, non-distended EXTREMITIES:  No edema; No deformity   EKG/LABS/ Recent Cardiac Studies   ECG personally reviewed by me today - ***  Risk Assessment/Calculations:   {Does this patient have ATRIAL  FIBRILLATION?:[id number]}      Lab Results  Component Value Date   WBC 10.7 (H) 02/22/2023   HGB 7.4 (L) 02/22/2023   HCT 25.4 (L) 02/22/2023   MCV 77.7 (L) 02/22/2023   PLT 253 02/22/2023   Lab Results  Component Value Date   CREATININE 11.23 (H) 02/22/2023   BUN 137 (H) 02/22/2023   NA 136 02/22/2023   K 5.6 (H) 02/22/2023   CL 96 (L) 02/22/2023   CO2 22 02/22/2023   Lab Results  Component Value Date   CHOL 60 02/09/2023   HDL 14 (L) 02/09/2023   LDLCALC 23 02/09/2023   TRIG 115 02/09/2023   CHOLHDL 4.3 02/09/2023    Lab Results  Component Value Date   HGBA1C 4.5 (L) 11/21/2021   Assessment & Plan    1.Coronary artery disease: -s/p rior MI with DES to LAD 02/2020. NSTEMI with seizure and following hypokalemia that required 1 round of CPR with return of ROSC   2.Paroxysmal AF: -Patient currently on low-dose amiodarone  at 200 mg daily  with controlled rate and no complaint of palpitations.  3.Essential hypertension: -Patient's blood pressure today was   4.History of CVA/ICH: -History of multiple CVAs with most recent 01/2021 while holding OAC.  5. ESRD: -HD on Tu/Thu/Sat  6.s/p right BKA: -Recently diagnosed with osteomyelitis of right leg with BKA performed on 02/09/2023      Disposition: Follow-up with Candyce Reek, MD or APP in *** months {Are you ordering a CV Procedure (e.g. stress test, cath, DCCV, TEE, etc)?   Press F2        :[id number]}   Signed, Wyn Raddle, Jackee Shove, NP 02/27/2023, 6:04 PM [facility]

## 2023-02-28 ENCOUNTER — Encounter (HOSPITAL_COMMUNITY): Payer: Self-pay

## 2023-02-28 ENCOUNTER — Telehealth (INDEPENDENT_AMBULATORY_CARE_PROVIDER_SITE_OTHER): Payer: Self-pay | Admitting: Gastroenterology

## 2023-02-28 ENCOUNTER — Encounter (HOSPITAL_COMMUNITY)
Admission: RE | Admit: 2023-02-28 | Discharge: 2023-02-28 | Disposition: A | Discharge status: Home / Self Care | Payer: Medicare HMO | Source: Ambulatory Visit | Attending: Gastroenterology | Admitting: Gastroenterology

## 2023-02-28 ENCOUNTER — Other Ambulatory Visit: Payer: Self-pay

## 2023-02-28 VITALS — Ht 75.0 in | Wt 177.2 lb

## 2023-02-28 DIAGNOSIS — Z992 Dependence on renal dialysis: Secondary | ICD-10-CM

## 2023-02-28 NOTE — Telephone Encounter (Signed)
 Neece (nurse from LTC center that pt is at) called in and states that the pt told them that he had a TCS on Thursday. LTC center does not have any prep information. From what I can see in Epic, pt did pre op visit. Nurse states it may be better to reschedule. Pt has dialysis Monday, Wednesday,Friday. Advised that I do not have any thing for January but I could call once I have Feb schedule. Nurse verbalized understanding.   [facility] in [place] [phone] ask for [employer] (nurse direct number) [phone]

## 2023-03-01 ENCOUNTER — Ambulatory Visit: Payer: Medicare HMO | Attending: Nurse Practitioner | Admitting: Nurse Practitioner

## 2023-03-01 DIAGNOSIS — I639 Cerebral infarction, unspecified: Secondary | ICD-10-CM

## 2023-03-01 DIAGNOSIS — I4891 Unspecified atrial fibrillation: Secondary | ICD-10-CM

## 2023-03-01 DIAGNOSIS — I10 Essential (primary) hypertension: Secondary | ICD-10-CM

## 2023-03-01 DIAGNOSIS — N186 End stage renal disease: Secondary | ICD-10-CM

## 2023-03-01 DIAGNOSIS — I25118 Atherosclerotic heart disease of native coronary artery with other forms of angina pectoris: Secondary | ICD-10-CM

## 2023-03-02 ENCOUNTER — Encounter: Payer: Self-pay | Admitting: Nurse Practitioner

## 2023-03-02 ENCOUNTER — Encounter: Payer: Medicare HMO | Admitting: Family

## 2023-03-03 ENCOUNTER — Ambulatory Visit (HOSPITAL_COMMUNITY): Admit: 2023-03-03 | Payer: Medicare HMO | Admitting: Gastroenterology

## 2023-03-03 ENCOUNTER — Encounter (HOSPITAL_COMMUNITY): Payer: Self-pay

## 2023-03-03 SURGERY — COLONOSCOPY WITH PROPOFOL
Anesthesia: Monitor Anesthesia Care

## 2023-03-04 ENCOUNTER — Encounter: Payer: Medicare HMO | Admitting: Family

## 2023-03-08 ENCOUNTER — Encounter: Payer: Self-pay | Admitting: Family

## 2023-03-08 ENCOUNTER — Ambulatory Visit (INDEPENDENT_AMBULATORY_CARE_PROVIDER_SITE_OTHER): Payer: Medicare HMO | Admitting: Family

## 2023-03-08 DIAGNOSIS — Z89511 Acquired absence of right leg below knee: Secondary | ICD-10-CM

## 2023-03-08 NOTE — Progress Notes (Signed)
 Post-Op Visit Note   Patient: [name].           Date of Birth: 1970-11-25           MRN: [id number] Visit Date: 03/08/2023 PCP: [name], FNP  Chief Complaint:  Chief Complaint  Patient presents with   Right Knee - Routine Post Op    HPI:  HPI The patient is a 53 year old gentleman seen 4 weeks status post below-knee amputation on the right.  He has significant pain.  He is residing at skilled nursing. Ortho Exam On examination right residual limb there is significant ischemic change to his incision especially medially there is no dehiscence today eschar in the wound bed the largest eschar measures 5 cm x 2 cm.  Scant bloody drainage.  There is no surrounding erythema warmth  Visit Diagnoses: No diagnosis found.  Plan: Will continue with daily dose of cleansing and dry dressings continue shrinker.  Will reevaluate in 2 weeks.  Discussed possibility of further limb salvage surgery with the patient.  Follow-Up Instructions: No follow-ups on file.   Imaging: No results found.  Orders:  No orders of the defined types were placed in this encounter.  No orders of the defined types were placed in this encounter.    PMFS History: Patient Active Problem List   Diagnosis Date Noted   Diabetic hypoglycemia (HCC) 02/13/2023   S/P BKA (below knee amputation), right [facility]) - 02-09-2023 02/09/2023   Gangrene of right foot (HCC) 02/07/2023   H/O spontaneous intraventricular intracranial hemorrhage 10/2021 - while on plavix  and Eliquis  02/26/2022   History of CVA (cerebrovascular accident) - with ICH while on plavix  and Eliquis  in 11-2021. 02/26/2022   H/O enucleation of left eyeball 02/26/2022   PAD (peripheral artery disease) (HCC) 02/26/2022   History of seizure 02/26/2022   Seasonal allergic rhinitis 07/08/2021   Myoclonus 06/23/2021   Exposure of orbital implant 03/16/2021   Steal syndrome as complication of dialysis access (HCC) 02/06/2021   Coronary artery  disease 02/05/2021   ESRD on hemodialysis (HCC)    Anemia due to chronic kidney disease, on chronic dialysis (HCC)    Paroxysmal atrial fibrillation (HCC) - not on systemic anticoagulation due to ICH while on Eliquis  in 11-2021. 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) 08/19/2020   Anxiety state 08/19/2020   Callosity 08/19/2020   ED (erectile dysfunction) of organic origin 08/19/2020   Long term (current) use of insulin  (HCC) 08/19/2020   Myalgia 08/19/2020   CAD/ H/o prior STEMI 02/2020 s/p DES to LAD 08/19/2020   Blindness of left eye 08/15/2020   Combined forms of age-related cataract of right eye 04/17/2020   Hypertension secondary to other renal disorders 07/20/2019   Moderate protein-calorie malnutrition (HCC) 05/14/2019   Chronic anemia 05/07/2019   Coagulation defect, unspecified (HCC) 05/07/2019   Disorder of phosphorus metabolism, unspecified 05/07/2019   Iron deficiency anemia, unspecified 05/07/2019   Secondary hyperparathyroidism of renal origin (HCC) 11/28/2018   Chronic Grade 2 diastolic heart failure/EF 55 to 60% 12/13/2017   Diabetes mellitus, type II (HCC) 11/23/2017   Diabetic retinopathy (HCC) 07/05/2017   Uncontrolled type 2 diabetes mellitus with hyperglycemia, with long-term current use of insulin  (HCC) 04/20/2016   Mixed hyperlipidemia    Essential hypertension, benign    Past Medical History:  Diagnosis Date   Acute Bil  CVAs of Bil Frontal amd Rt Parietal  in Watershed Distribution 04/21/2021   Patchy acute cortically-based infarcts within the  bilateral high  frontal lobes and right parietal lobe (predominantly in a watershed  distribution).   Subcentimeter acute infarct within the callosal splenium on the  left.     Acute bilateral cerebral infarction in a watershed distribution [facility]) 04/21/2021   a.) MRI brain 04/21/2021 --> patchy ACUTE cortically-based infarcts within the bilateral high frontal lobes and right parietal lobe   Acute cerebral  infarction (HCC) 02/13/2021   a.) MRI brain 02/13/2021: acute LEFT hippocampal infarct   Acute cerebral infarction (HCC) 04/21/2021   a.) MRI brain 04/21/2021: subcentimeter ACUTE infarct within the callosal splenium on the LEFT   Acute osteomyelitis of toe of right foot with gangrene (HCC) 02/26/2022   Anaphylactic reaction due to adverse effect of correct drug or medicament properly administered, initial encounter 05/08/2019   Anemia of chronic renal failure    Anxiety    Aortic dilatation (HCC) 11/25/2020   a.) TTE 11/25/2020: Ao root measured 38 mm. b.) TTE 04/22/2021: Ao root measured 44 mm; c. 11/2021 Echo: Ao root 40mm.   Atrial fibrillation (HCC)    a.) CHA2DS2-VASc = 6 (CHF, HTN, CVA x2, prior MI, T2DM). b.) rate/rhythm maintained on oral amiodarone  + carvedilol ; chronic OAC/AP therapy discontinued following ICH   Benign prostatic hyperplasia with lower urinary tract symptoms 11/07/2019   BPH (benign prostatic hyperplasia)    Cardiac arrest (HCC) 07/26/2021   a.) in setting of hyperkalemia, NSTEMI, and seizure following missed HD; pulseless and apneic --> required 1 round of CPR prior to ROSC   Cellulitis    Cerebral hemorrhage (HCC) 11/21/2021   a.) CT head 11/21/2021 --> 1.4 x 1.5 x 3.1 cm ACUTE hemorrhage within the inferior aspect of the fourth ventricle, and extending inferiorly through the foramen of foramen of [place] --> occurred in setting of HTN emergency and DOAC/APT-->eliquis /plavix  d/c'd.   Cerebral microvascular disease    Chronic diarrhea    Chronic heart failure with preserved ejection fraction (HFpEF) (HCC) 07/19/2017   a.) 06/2017 Echo: EF 75%; b.) 11/2020 Echo: EF 55-60%; c.) 04/2021 Echo: EF 55-60%; d.) 11/2021 Echo: EF 65-70%, GrII DD, nl RV fxn, sev dil LA, large circumferential pericardial eff w/o tamponade, triv MR, AoV sclerosis. Ao root 40mm; e.) TTE 02/28/2025: EF 60-65%, sev LVH, mod posterior LV effusion, AoV sclerosis   Chronic pain of both ankles     Coronary artery disease 02/26/2020   a. 02/2020 Ant STEMI/PCI: LM nl, LAD 2m (3.5x30 Resolute Onyx), RI nl, LCX min irregs, RCA min irregs. EF 65%.   DDD (degenerative disc disease), cervical    Diabetes mellitus, type II (HCC)    Diabetic neuropathy (HCC)    Esophagitis    ESRD (end stage renal disease) on dialysis (HCC)    a.) [facility]; T-Th-Sat   Frequent falls    Gait instability    Gastritis    Gastroparesis    GERD (gastroesophageal reflux disease)    H/O enucleation of left eyeball    Heart palpitations    History of kidney stones    HLD (hyperlipidemia)    Hypertension    Insomnia    a.) uses melatonin PRN   Long term current use of amiodarone     Nausea and vomiting in adult    recurrent   NSTEMI (non-ST elevated myocardial infarction) (HCC) 07/26/2021   a.) hyperkalemic from missed HD; seizures; decompensated to cardiac arrest and required CPR x 1 round prior to ROSC.   NVG (neovascular glaucoma), left, 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   Formatting of this note  might be different from the original.  Added automatically from request for surgery [id number]     Osteomyelitis [facility])    Pericardial effusion    a. 11/2021 Echo: EF 65-70%, GrII DD, nl RV fxn, sev dil LA, large circumferential pericardial eff w/o tamponade, triv MR, AoV sclerosis. Ao root 40mm.   Seizure (HCC)    a.) last 07/26/2021 in setting of missed HD --> hyperkalemic at 6.5 --> pulseless/apneic and required CPR; discharged home on levetiracetam .   ST elevation myocardial infarction (STEMI) of anterior wall (HCC) 02/26/2020   a.) LHC/PCI 02/26/2020 --> EF 65%; LVEDP 11 mmHg; 90% mLAD (3.5 x 20 mm Resolute Onyx DES x 1)    Family History  Problem Relation Age of Onset   CAD Father    Stroke Father    Diabetes Mellitus II Mother    Kidney failure Mother    Schizophrenia Mother     Past Surgical History:  Procedure Laterality Date   A/V FISTULAGRAM Left 08/31/2021   Procedure: A/V Fistulagram;  Surgeon: Marea Selinda RAMAN, MD;  Location: [name] INVASIVE CV LAB;  Service: Cardiovascular;  Laterality: Left;   A/V FISTULAGRAM Left 01/25/2022   Procedure: A/V Fistulagram;  Surgeon: Marea Selinda RAMAN, MD;  Location: [name] INVASIVE CV LAB;  Service: Cardiovascular;  Laterality: Left;   A/V FISTULAGRAM Right 10/18/2022   Procedure: A/V Fistulagram;  Surgeon: Marea Selinda RAMAN, MD;  Location: [name] INVASIVE CV LAB;  Service: Cardiovascular;  Laterality: Right;   ABDOMINAL AORTOGRAM W/LOWER EXTREMITY Bilateral 02/08/2023   Procedure: ABDOMINAL AORTOGRAM W/LOWER EXTREMITY;  Surgeon: Pearline Norman RAMAN, MD;  Location: [facility] INVASIVE CV LAB;  Service: Vascular;  Laterality: Bilateral;   AMPUTATION Left 03/16/2016   Procedure: AMPUTATION DIGIT LEFT HALLUX;  Surgeon: Donnice JONELLE Fees, DPM;  Location: [facility] OR;  Service: Podiatry;  Laterality: Left;  can start around 5    AMPUTATION Right 02/09/2021   Procedure: AMPUTATION DIGIT;  Surgeon: Romona Harari, MD;  Location: [facility] OR;  Service: Orthopedics;  Laterality: Right;   AMPUTATION Right 02/04/2023   Procedure: AMPUTATION 4TH AND 5TH METATARSAL;  Surgeon: Malvin Marsa FALCON, DPM;  Location: [facility] OR;  Service: Orthopedics/Podiatry;  Laterality: Right;  I&D, partial ray resection, abx beads   AMPUTATION Right 02/09/2023   Procedure: RIGHT BELOW KNEE AMPUTATION;  Surgeon: Harden Jerona GAILS, MD;  Location: [facility] OR;  Service: Orthopedics;  Laterality: Right;   AMPUTATION TOE Right 02/28/2022   Procedure: AMPUTATION TOE;  Surgeon: Joya Stabs, DPM;  Location: [name] ORS;  Service: Podiatry;  Laterality: Right;  right fifth toe   ARTHROSCOPIC REPAIR ACL Left    AV FISTULA PLACEMENT Right 05/31/2019   Procedure: Brachiocephalic AV fistula creation;  Surgeon: Marea Selinda RAMAN, MD;  Location: [name] ORS;  Service: Vascular;  Laterality: Right;   AV FISTULA PLACEMENT Left 08/13/2021   Procedure: ARTERIOVENOUS (AV) FISTULA CREATION (BRACHIALCEPHALIC);  Surgeon: Marea Selinda RAMAN, MD;  Location: [name] ORS;  Service:  Vascular;  Laterality: Left;   COLONOSCOPY WITH PROPOFOL  N/A 10/28/2015   Procedure: COLONOSCOPY WITH PROPOFOL ;  Surgeon: Gladis RAYMOND Mariner, MD;  Location: [facility] ENDOSCOPY;  Service: Endoscopy;  Laterality: N/A;   COLONOSCOPY WITH PROPOFOL  N/A 10/29/2015   Procedure: COLONOSCOPY WITH PROPOFOL ;  Surgeon: Gladis RAYMOND Mariner, MD;  Location: [facility] ENDOSCOPY;  Service: Endoscopy;  Laterality: N/A;   COLONOSCOPY WITH PROPOFOL  N/A 01/27/2023   Procedure: COLONOSCOPY WITH PROPOFOL ;  Surgeon: Cinderella Deatrice FALCON, MD;  Location: [facility] ENDO SUITE;  Service: Endoscopy;  Laterality: N/A;  12:30pm, asa 3  CORONARY ULTRASOUND/IVUS N/A 02/26/2020   Procedure: Intravascular Ultrasound/IVUS;  Surgeon: Dann Candyce RAMAN, MD;  Location: [facility] INVASIVE CV LAB;  Service: Cardiovascular;  Laterality: N/A;   CORONARY/GRAFT ACUTE MI REVASCULARIZATION N/A 02/26/2020   Procedure: Coronary/Graft Acute MI Revascularization;  Surgeon: Dann Candyce RAMAN, MD;  Location: [facility] INVASIVE CV LAB;  Service: Cardiovascular;  Laterality: N/A;   DIALYSIS/PERMA CATHETER INSERTION Right 04/26/2019   Perm Cath    DIALYSIS/PERMA CATHETER INSERTION N/A 04/26/2019   Procedure: DIALYSIS/PERMA CATHETER INSERTION;  Surgeon: Marea Selinda RAMAN, MD;  Location: [name] INVASIVE CV LAB;  Service: Cardiovascular;  Laterality: N/A;   DIALYSIS/PERMA CATHETER INSERTION N/A 05/25/2021   Procedure: DIALYSIS/PERMA CATHETER INSERTION;  Surgeon: Marea Selinda RAMAN, MD;  Location: [name] INVASIVE CV LAB;  Service: Cardiovascular;  Laterality: N/A;   DIALYSIS/PERMA CATHETER INSERTION N/A 08/31/2021   Procedure: DIALYSIS/PERMA CATHETER INSERTION;  Surgeon: Marea Selinda RAMAN, MD;  Location: [name] INVASIVE CV LAB;  Service: Cardiovascular;  Laterality: N/A;   DIALYSIS/PERMA CATHETER REMOVAL N/A 08/15/2019   Procedure: DIALYSIS/PERMA CATHETER REMOVAL;  Surgeon: Jama Cordella MATSU, MD;  Location: [name] INVASIVE CV LAB;  Service: Cardiovascular;  Laterality: N/A;   DIALYSIS/PERMA CATHETER REMOVAL  N/A 03/15/2022   Procedure: DIALYSIS/PERMA CATHETER REMOVAL;  Surgeon: Marea Selinda RAMAN, MD;  Location: [name] INVASIVE CV LAB;  Service: Cardiovascular;  Laterality: N/A;   EMBOLIZATION (CATH LAB) Right 02/06/2021   Procedure: EMBOLIZATION;  Surgeon: Marea Selinda RAMAN, MD;  Location: [name] INVASIVE CV LAB;  Service: Cardiovascular;  Laterality: Right;  Right Upper Extremity Dialysis Access, Permcath Placement   ESOPHAGOGASTRODUODENOSCOPY (EGD) WITH PROPOFOL  N/A 12/27/2017   Procedure: ESOPHAGOGASTRODUODENOSCOPY (EGD) WITH PROPOFOL ;  Surgeon: Toledo, Ladell POUR, MD;  Location: [name] ENDOSCOPY;  Service: Gastroenterology;  Laterality: N/A;   EYE SURGERY     LEFT HEART CATH AND CORONARY ANGIOGRAPHY N/A 02/26/2020   Procedure: LEFT HEART CATH AND CORONARY ANGIOGRAPHY;  Surgeon: Dann Candyce RAMAN, MD;  Location: [facility] INVASIVE CV LAB;  Service: Cardiovascular;  Laterality: N/A;   PROSTATE SURGERY  2016   REVISON OF ARTERIOVENOUS FISTULA Right 07/22/2022   Procedure: RESECTION OF ANEURYSMAL RIGHT ARM ARTERIOVENOUS FISTULA;  Surgeon: Marea Selinda RAMAN, MD;  Location: [name] ORS;  Service: Vascular;  Laterality: Right;   TONSILECTOMY/ADENOIDECTOMY WITH MYRINGOTOMY     TONSILLECTOMY     UPPER EXTREMITY ANGIOGRAPHY Right 11/26/2020   Procedure: Upper Extremity Angiography;  Surgeon: Marea Selinda RAMAN, MD;  Location: [name] INVASIVE CV LAB;  Service: Cardiovascular;  Laterality: Right;   UPPER EXTREMITY ANGIOGRAPHY Right 02/05/2021   Procedure: UPPER EXTREMITY ANGIOGRAPHY;  Surgeon: Marea Selinda RAMAN, MD;  Location: [name] INVASIVE CV LAB;  Service: Cardiovascular;  Laterality: Right;   Social History   Occupational History   Occupation: Disability    Comment: not employed  Tobacco Use   Smoking status: Never   Smokeless tobacco: Never  Vaping Use   Vaping status: Never Used  Substance and Sexual Activity   Alcohol  use: No   Drug use: No   Sexual activity: Yes

## 2023-03-17 ENCOUNTER — Emergency Department (HOSPITAL_COMMUNITY): Payer: Medicare HMO

## 2023-03-17 ENCOUNTER — Emergency Department (HOSPITAL_COMMUNITY)
Admission: EM | Admit: 2023-03-17 | Discharge: 2023-03-17 | Disposition: A | Discharge status: Home / Self Care | Payer: Medicare HMO | Attending: Emergency Medicine | Admitting: Emergency Medicine

## 2023-03-17 ENCOUNTER — Other Ambulatory Visit: Payer: Self-pay

## 2023-03-17 ENCOUNTER — Encounter (HOSPITAL_COMMUNITY): Payer: Self-pay

## 2023-03-17 DIAGNOSIS — R110 Nausea: Secondary | ICD-10-CM | POA: Diagnosis not present

## 2023-03-17 DIAGNOSIS — R42 Dizziness and giddiness: Secondary | ICD-10-CM | POA: Insufficient documentation

## 2023-03-17 DIAGNOSIS — G9389 Other specified disorders of brain: Secondary | ICD-10-CM | POA: Diagnosis not present

## 2023-03-17 DIAGNOSIS — H538 Other visual disturbances: Secondary | ICD-10-CM | POA: Diagnosis present

## 2023-03-17 DIAGNOSIS — Z7982 Long term (current) use of aspirin: Secondary | ICD-10-CM | POA: Insufficient documentation

## 2023-03-17 DIAGNOSIS — N186 End stage renal disease: Secondary | ICD-10-CM | POA: Insufficient documentation

## 2023-03-17 DIAGNOSIS — R519 Headache, unspecified: Secondary | ICD-10-CM | POA: Insufficient documentation

## 2023-03-17 DIAGNOSIS — Z89511 Acquired absence of right leg below knee: Secondary | ICD-10-CM | POA: Diagnosis not present

## 2023-03-17 DIAGNOSIS — Z992 Dependence on renal dialysis: Secondary | ICD-10-CM | POA: Insufficient documentation

## 2023-03-17 LAB — COMPREHENSIVE METABOLIC PANEL
ALT: 26 U/L (ref 0–44)
AST: 31 U/L (ref 15–41)
Albumin: 2.8 g/dL — ABNORMAL LOW (ref 3.5–5.0)
Alkaline Phosphatase: 69 U/L (ref 38–126)
Anion gap: 14 (ref 5–15)
BUN: 54 mg/dL — ABNORMAL HIGH (ref 6–20)
CO2: 24 mmol/L (ref 22–32)
Calcium: 8.6 mg/dL — ABNORMAL LOW (ref 8.9–10.3)
Chloride: 102 mmol/L (ref 98–111)
Creatinine, Ser: 9.78 mg/dL — ABNORMAL HIGH (ref 0.61–1.24)
GFR, Estimated: 6 mL/min — ABNORMAL LOW (ref >60)
Glucose, Bld: 85 mg/dL (ref 70–99)
Potassium: 3 mmol/L — ABNORMAL LOW (ref 3.5–5.1)
Sodium: 140 mmol/L (ref 135–145)
Total Bilirubin: 0.7 mg/dL (ref 0.0–1.2)
Total Protein: 6.7 g/dL (ref 6.5–8.1)

## 2023-03-17 LAB — CBC
HCT: 26.4 % — ABNORMAL LOW (ref 39.0–52.0)
Hemoglobin: 8 g/dL — ABNORMAL LOW (ref 13.0–17.0)
MCH: 22.2 pg — ABNORMAL LOW (ref 26.0–34.0)
MCHC: 30.3 g/dL (ref 30.0–36.0)
MCV: 73.3 fL — ABNORMAL LOW (ref 80.0–100.0)
Platelets: 197 10*3/uL (ref 150–400)
RBC: 3.6 MIL/uL — ABNORMAL LOW (ref 4.22–5.81)
RDW: 20 % — ABNORMAL HIGH (ref 11.5–15.5)
WBC: 7 10*3/uL (ref 4.0–10.5)
nRBC: 0 % (ref 0.0–0.2)

## 2023-03-17 MED ORDER — POTASSIUM CHLORIDE CRYS ER 20 MEQ PO TBCR: 40.0000 meq | EXTENDED_RELEASE_TABLET | Freq: Once | ORAL | Status: DC

## 2023-03-17 MED ORDER — ONDANSETRON HCL 4 MG PO TABS: 4.0000 mg | ORAL_TABLET | Freq: Four times a day (QID) | ORAL | 0 refills | Status: DC | PRN

## 2023-03-17 MED ORDER — ONDANSETRON 4 MG PO TBDP
4.0000 mg | ORAL_TABLET | Freq: Once | ORAL | Status: AC
  Administered 2023-03-17: 4 mg via ORAL
  Filled 2023-03-17: qty 1

## 2023-03-17 MED ORDER — IOHEXOL 350 MG/ML SOLN
75.0000 mL | Freq: Once | INTRAVENOUS | Status: AC | PRN
  Administered 2023-03-17: 75 mL via INTRAVENOUS

## 2023-03-17 MED ORDER — CARVEDILOL 12.5 MG PO TABS
12.5000 mg | ORAL_TABLET | ORAL | Status: AC
  Administered 2023-03-17: 12.5 mg via ORAL
  Filled 2023-03-17: qty 1

## 2023-03-17 MED ORDER — ACETAMINOPHEN 500 MG PO TABS
1000.0000 mg | ORAL_TABLET | Freq: Once | ORAL | Status: AC
  Administered 2023-03-17: 1000 mg via ORAL
  Filled 2023-03-17: qty 2

## 2023-03-17 MED ORDER — MECLIZINE HCL 12.5 MG PO TABS
25.0000 mg | ORAL_TABLET | Freq: Once | ORAL | Status: AC
  Administered 2023-03-17: 25 mg via ORAL
  Filled 2023-03-17: qty 2

## 2023-03-17 NOTE — Progress Notes (Deleted)
 Cardiology Office Note:    Date:  03/17/2023   ID:  [name] Barns., DOB 25-Jul-1970, MRN [id number]  PCP:  [name], FNP  Cardiologist:  Lance Muss, MD { Click to update primary MD,subspecialty MD or APP then REFRESH:1}    Referring MD: [name], FNP   Chief Complaint: ***  History of Present Illness:    [name] Jairen Goldfarb. is a 53 y.o. male with a history of CAD s/p DES to mid LAD in 02/2020, chronic HFpEF, paroxysmal atrial fibrillation not on anticoagulation, recurrent near syncope/ syncope after dialysis, CVA in 01/2021 and 03/2021, intracranial hemorrhage in 10/2021,  hypertension, hyperlipidemia, ESRD on T/Th/Sat, gastroparesis, GERD, chronic anemia, dry gangrene, osteomyelitis s/p right 5th toe amputation in 02/2021, and medication non-compliance who presents today for routine follow-up.   Patient was admitted in 02/2020 as a code STEMI after presenting with a sudden onset of hypotension during dialysis and new onset atrial fibrillation with RVR. EKG showed ST elevations in anterior leads so code STEMI was called. Emergent LHC showed 90% stenosis of mid LAD but no other significant disease. He underwent successful PCI with DES to mid LAD at that time. EKG changes were felt to be more from demand ischemia in setting of hypotension and atrial fibrillation with underlying LAD disease rather than true ACS. He had no recurrent atrial fibrillation that admission so was not discharged on anticoagulation at that time. However, he subsequently had recurrent atrial fibrillation in 11/2020 and was started on Eliquis.   He has had multiple admission over the last couple of years for non-cardiac issues. He suffered a stroke in 01/2021 after Eliquis was held for a recent finger amputation. Neurology recommended continuing Eliquis and Plavix at that time. He had recurrent stroke in 04/2021 with watershed distribution felt to possibly be related to intra-dialytic hypotensive episode.  Echo at that time showed LVEF of 55-60% with severe LVH and grade 1 diastolic dysfunction, normal RV and size function, mild dilatation of the aortic root measuring 44 mm, and modreate pericardial effusion. Neurology recommended Eliquis and Plavix. In 07/2021, he suffered a seizure and brief cardiac arrest requiring CPR in setting of hyperkalemia due to missed hemodialysis sessions. He was started on Keppra. He was admitted again in 11/2022 for hypertensive emergency with systolic BP in the 200s and ICH after presenting with malaise, nausea/ vomiting, fevers, and a mild headache. Echo showed LVEF of 65-70% with severe LVH and grade 2 diastolic dysfunction as well as a large pericardial effusion but no evidence of tamponade. Hospitalization was complicated by seizure. Plavix and Eliquis were held in the setting of ICH. He was admitted with osteomyelitis in 02/2022 and underwent amputaiton of his right 5th toe. He was admitted in 03/2022 for acute encephalopathy.   Patient was lasts seen by Robin Searing, NP, in 09/2022 at which time he was experiencing both hypotensive episodes as well as spikes in his BP during dialysis. He has severe diarrhea due to gastroparesis and reported not eating anything prior to dialysis. BP was well controlled in the office and he seemed to be doing well from a cardiac standpoint with no chest pain or shortness of breath.   Most recent Echo in 10/2022 showed LVEF of 60-65% with no regional wall motion abnormalities and severe LVH, normal RV size and function, severe left atrial dilatation, small pericardial effusion along the right atrium and moderate pericardial effusion posterior to the left atrium, and mild dilatation of the aortic root measuring 44mm.  Patient was admitted from 02/03/2023 to 02/23/2023 for a gangrenous wound on the right foot and was found to have osteomyelitis. Vascular Surgery was consulted and he underwent a peripheral angiogram that showed microvascular disease but  no intervenable lesion. He underwent BKA on 02/09/2023.   ***     EKGs/Labs/Other Studies Reviewed:    The following studies were reviewed today: ***  EKG:  EKG *** ordered today. EKG personally reviewed and demonstrates ***.  Recent Labs: 04/07/2022: TSH 6.017 02/05/2023: Magnesium 1.8 03/17/2023: ALT 26; BUN 54; Creatinine, Ser 9.78; Hemoglobin 8.0; Platelets 197; Potassium 3.0; Sodium 140  Recent Lipid Panel    Component Value Date/Time   CHOL 60 02/09/2023 0323   TRIG 115 02/09/2023 0323   HDL 14 (L) 02/09/2023 0323   CHOLHDL 4.3 02/09/2023 0323   VLDL 23 02/09/2023 0323   LDLCALC 23 02/09/2023 0323   LDLCALC 102 (H) 04/25/2019 1159    Physical Exam:    Vital Signs: There were no vitals taken for this visit.    Wt Readings from Last 3 Encounters:  03/17/23 175 lb (79.4 kg)  02/28/23 177 lb 4 oz (80.4 kg)  02/22/23 177 lb 4 oz (80.4 kg)     General: 53 y.o. male in no acute distress. HEENT: Normocephalic and atraumatic. Sclera clear.  Neck: Supple. No carotid bruits. No JVD. Heart: *** RRR. Distinct S1 and S2. No murmurs, gallops, or rubs.  Lungs: No increased work of breathing. Clear to ausculation bilaterally. No wheezes, rhonchi, or rales.  Abdomen: Soft, non-distended, and non-tender to palpation.  Extremities: No lower extremity edema.  Radial and distal pedal pulses 2+ and equal bilaterally. Skin: Warm and dry. Neuro: No focal deficits. Psych: Normal affect. Responds appropriately.   Assessment:    No diagnosis found.  Plan:     Disposition: Follow up in ***   Signed, Corrin Parker, PA-C  03/17/2023 2:14 PM    [facility]

## 2023-03-17 NOTE — ED Provider Notes (Signed)
[place] [facility]MENT AT [facility] Provider Note   CSN: [id number] Arrival date & time: 03/17/23  3086     History  Chief Complaint  Patient presents with   Dizziness    [name]. is a 53 y.o. 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  Patient presents to ED for evaluation of dizziness, headache.  Patient reports that [name] has end-stage renal disease on dialysis Tuesday Thursday Saturday.  Reports that [name] missed his last dialysis on Tuesday, last dialysis session was last Friday.  [name] has been residing at [facility] for rehab secondary to his recent BKA.  Patient states that this morning [name] woke up with a headache but the headache worsen during the course of his treatment.  [name] reports that [name] was unable to withstand the entire course of dialysis and had to be removed from the machine 45 minutes early.  [name] states that [name] became dizzy during the course of his dialysis and developed blurred vision to his right eye.  [name] reports a history of the same while receiving dialysis.  States [name] has become dizzy with blurred vision in the past.  Reports his headache is rated 5 out of 10, not worse headache of his life.  Denies one-sided weakness or numbness, abdominal pain.  [name] is endorsing nausea without vomiting secondary to his dizziness.  Denies chest pain or shortness of breath.   Dizziness Associated symptoms: headaches and nausea   Associated symptoms: no chest pain, no shortness of breath, no vomiting and no weakness        Home Medications Prior to Admission medications   Medication Sig Start Date End Date Taking? Authorizing Provider  ondansetron (ZOFRAN) 4 MG tablet Take 1 tablet (4 mg total) by mouth every 6 (six) hours as needed for nausea or vomiting. 03/17/23  Yes Al Decant, PA-C  acetaminophen (TYLENOL)  325 MG tablet Take 1-2 tablets (325-650 mg total) by mouth every 4 (four) hours as needed for mild pain. 05/04/21   Love, Evlyn Kanner, PA-C  amiodarone (PACERONE) 200 MG tablet Take 1 tablet (200 mg total) by mouth every morning. 08/02/22 08/03/23  Corky Crafts, MD  ammonium lactate (AMLACTIN) 12 % cream Apply 1 Application topically as needed for dry skin. 05/25/22   Vivi Barrack, DPM  amphetamine-dextroamphetamine (ADDERALL XR) 5 MG 24 hr capsule Take 1 capsule (5 mg total) by mouth daily. 02/23/23   Danford, Earl Lites, MD  ascorbic acid (VITAMIN C) 1000 MG tablet Take 1 tablet (1,000 mg total) by mouth daily. 02/24/23   Danford, Earl Lites, MD  aspirin EC 81 MG tablet Take 1 tablet (81 mg total) by mouth daily. Swallow whole. 02/24/23   Alberteen Sam, MD  atorvastatin (LIPITOR) 80 MG tablet TAKE ONE TABLET BY MOUTH AT BEDTIME. 05/07/22   Corky Crafts, MD  calcitRIOL (ROCALTROL) 0.25 MCG capsule Take 9 capsules (2.25 mcg total) by mouth every Tuesday, Thursday, and Saturday at 6 PM. 02/24/23   Danford, Earl Lites, MD  calcium acetate (PHOSLO) 667 MG capsule Take 2 capsules (1,334 mg total) by mouth 3 (three) times daily with meals. 05/04/21   Love, Evlyn Kanner, PA-C  carvedilol (COREG) 12.5 MG tablet Take 1 tablet (12.5 mg total) by mouth 2 (two) times daily. 02/23/23   Danford, Earl Lites, MD  cinacalcet (SENSIPAR) 30 MG tablet Take 2 tablets (  60 mg total) by mouth every Tuesday, Thursday, and Saturday at 6 PM. 02/24/23   Danford, Earl Lites, MD  dorzolamide-timolol (COSOPT) 2-0.5 % ophthalmic solution Place 1 drop into the right eye 2 (two) times daily. 11/15/22 11/15/23  [provider]  fluticasone (FLONASE) 50 MCG/ACT nasal spray Place 2 sprays into both nostrils daily. 10/05/22   Leath-Warren, Sadie Haber, NP  HYDROcodone-acetaminophen (NORCO/VICODIN) 5-325 MG tablet Take 1 tablet by mouth every 6 (six) hours as needed for moderate pain (pain score 4-6). 02/23/23    Danford, Earl Lites, MD  hydrocortisone (ANUSOL-HC) 2.5 % rectal cream Place 1 Application rectally 2 (two) times daily. Patient not taking: Reported on 02/03/2023 10/05/22   Leath-Warren, Sadie Haber, NP  levETIRAcetam (KEPPRA) 500 MG tablet Take 1 tablet (500 mg total) by mouth 4 (four) times a week. On Sunday, Monday, Wednesday and Friday, all non-dialysis days 02/25/23   Alberteen Sam, MD  levETIRAcetam (KEPPRA) 750 MG tablet Take 1 tablet (750 mg total) by mouth 3 (three) times a week. On Tuesday Thursday and Saturday, on dialysis days 02/24/23   Alberteen Sam, MD  lidocaine-prilocaine (EMLA) cream Apply topically. 12/24/21   [provider]  loperamide (IMODIUM) 2 MG capsule Take 1 capsule (2 mg total) by mouth as needed for diarrhea or loose stools. 02/23/23   Danford, Earl Lites, MD  midodrine (PROAMATINE) 5 MG tablet Take 1 tablet (5 mg total) by mouth 3 (three) times daily with meals. 02/23/23   Danford, Earl Lites, MD  multivitamin (RENA-VIT) TABS tablet Take 1 tablet by mouth at bedtime. 02/23/23   Danford, Earl Lites, MD  nutrition supplement, JUVEN, (JUVEN) PACK Take 1 packet by mouth 2 (two) times daily between meals. 02/23/23   Danford, Earl Lites, MD  Nutritional Supplements (,FEEDING SUPPLEMENT, PROSOURCE PLUS) liquid Take 30 mLs by mouth 3 (three) times daily between meals. 02/23/23   Danford, Earl Lites, MD  psyllium (METAMUCIL) 58.6 % packet Take 1 packet by mouth 2 (two) times daily. Patient taking differently: Take 1 packet by mouth 2 (two) times daily. New medication from Dr. Burgess Estelle, have to pick up at Pharmacy. 01/27/23 04/27/23  Franky Macho, MD      Allergies    Promethazine, Promethazine hcl, and Other    Review of Systems   Review of Systems  Eyes:  Positive for visual disturbance.  Respiratory:  Negative for shortness of breath.   Cardiovascular:  Negative for chest pain.  Gastrointestinal:  Positive for nausea. Negative for  abdominal pain and vomiting.  Neurological:  Positive for dizziness and headaches. Negative for weakness and numbness.  All other systems reviewed and are negative.   Physical Exam Updated Vital Signs BP (!) 196/82 (BP Location: Right Arm)   Pulse 62   Temp 98.1 F (36.7 C) (Oral)   Resp (!) 6   Ht 6\' 3"  (1.905 m)   Wt 79.4 kg   SpO2 100%   BMI 21.87 kg/m  Physical Exam Vitals and nursing note reviewed.  Constitutional:      General: [name] is not in acute distress.    Appearance: Normal appearance. [name] is not ill-appearing, toxic-appearing or diaphoretic.  HENT:     Head: Normocephalic and atraumatic.     Nose: Nose normal.     Mouth/Throat:     Mouth: Mucous membranes are moist.     Pharynx: Oropharynx is clear.  Eyes:     Extraocular Movements: Extraocular movements intact.     Conjunctiva/sclera: Conjunctivae normal.  Pupils: Pupils are equal, round, and reactive to light.  Cardiovascular:     Rate and Rhythm: Normal rate and regular rhythm.  Pulmonary:     Effort: Pulmonary effort is normal.     Breath sounds: Normal breath sounds. No wheezing.  Abdominal:     General: Abdomen is flat. Bowel sounds are normal.     Palpations: Abdomen is soft.     Tenderness: There is no abdominal tenderness.  Musculoskeletal:     Cervical back: Normal range of motion and neck supple. No tenderness.     Comments: Right BKA  Skin:    General: Skin is warm and dry.     Capillary Refill: Capillary refill takes less than 2 seconds.  Neurological:     General: No focal deficit present.     Mental Status: [name] is alert and oriented to person, place, and time.     GCS: GCS eye subscore is 4. GCS verbal subscore is 5. GCS motor subscore is 6.     Cranial Nerves: Cranial nerves 2-12 are intact. No cranial nerve deficit.     Sensory: Sensation is intact. No sensory deficit.     Motor: Motor function is intact. No weakness.     Coordination: Coordination is intact. Heel to [facility] Test normal.      Comments: Right-sided horizontal nystagmus     ED Results / Procedures / Treatments   Labs (all labs ordered are listed, but only abnormal results are displayed) Labs Reviewed  CBC - Abnormal; Notable for the following components:      Result Value   RBC 3.60 (*)    Hemoglobin 8.0 (*)    HCT 26.4 (*)    MCV 73.3 (*)    MCH 22.2 (*)    RDW 20.0 (*)    All other components within normal limits  COMPREHENSIVE METABOLIC PANEL - Abnormal; Notable for the following components:   Potassium 3.0 (*)    BUN 54 (*)    Creatinine, Ser 9.78 (*)    Calcium 8.6 (*)    Albumin 2.8 (*)    GFR, Estimated 6 (*)    All other components within normal limits    EKG EKG Interpretation Date/Time:  Thursday March 17 2023 09:28:37 EST Ventricular Rate:  65 PR Interval:  226 QRS Duration:  118 QT Interval:  454 QTC Calculation: 473 R Axis:   61  Text Interpretation: Sinus rhythm Prolonged PR interval LVH with secondary repolarization abnormality T wave abnormality Abnormal ECG Confirmed by Gerhard Munch [phone]) on 03/17/2023 9:48:21 AM  Radiology MR BRAIN WO CONTRAST Result Date: 03/17/2023 CLINICAL DATA:  dizziness and blurred vision EXAM: MRI HEAD WITHOUT CONTRAST TECHNIQUE: Multiplanar, multiecho pulse sequences of the brain and surrounding structures were obtained without intravenous contrast. COMPARISON:  MRI head 03/01/2022. FINDINGS: Brain: Remote cortical infarcts in the high frontal lobes bilaterally and the right parietal lobe. Additional scattered T2/FLAIR hyperintensities in the white matter are nonspecific but compatible with chronic microvascular ischemic change. No convincing evidence of acute infarct. No acute hemorrhage, mass lesion, midline shift or hydrocephalus. Redemonstrated sequela prior hemorrhage include areas of superficial siderosis Vascular: Major arterial flow voids are maintained at the skull base. Skull and upper cervical spine: Normal marrow signal.  Sinuses/Orbits: Mild paranasal sinus mucosal thickening. No acute orbital findings. Other: Trace mastoid effusions. IMPRESSION: 1. No evidence of acute intracranial abnormality. 2. Prior cortical infarcts and chronic microvascular ischemic changes. 3. 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 Electronically Signed   By:  Feliberto Harts M.D.   On: 03/17/2023 17:28   MR ANGIO HEAD WO CONTRAST Result Date: 03/17/2023 CLINICAL DATA:  dizziness and blurred vision EXAM: MRI HEAD WITHOUT CONTRAST TECHNIQUE: Multiplanar, multiecho pulse sequences of the brain and surrounding structures were obtained without intravenous contrast. COMPARISON:  MRI head 03/01/2022. FINDINGS: Brain: Remote cortical infarcts in the high frontal lobes bilaterally and the right parietal lobe. Additional scattered T2/FLAIR hyperintensities in the white matter are nonspecific but compatible with chronic microvascular ischemic change. No convincing evidence of acute infarct. No acute hemorrhage, mass lesion, midline shift or hydrocephalus. Redemonstrated sequela prior hemorrhage include areas of superficial siderosis Vascular: Major arterial flow voids are maintained at the skull base. Skull and upper cervical spine: Normal marrow signal. Sinuses/Orbits: Mild paranasal sinus mucosal thickening. No acute orbital findings. Other: Trace mastoid effusions. IMPRESSION: 1. No evidence of acute intracranial abnormality. 2. Prior cortical infarcts and chronic microvascular ischemic changes. 3. 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 Electronically Signed   By: Feliberto Harts M.D.   On: 03/17/2023 17:28   CT ANGIO HEAD NECK W WO CM Result Date: 03/17/2023 CLINICAL DATA:  Vertigo, dizziness, headache, blurred vision EXAM: CT ANGIOGRAPHY HEAD AND NECK WITH AND WITHOUT CONTRAST TECHNIQUE: Multidetector CT imaging of the head and neck was performed using the standard protocol during bolus administration of intravenous contrast. Multiplanar CT  image reconstructions and MIPs were obtained to evaluate the vascular anatomy. Carotid stenosis measurements (when applicable) are obtained utilizing NASCET criteria, using the distal internal carotid diameter as the denominator. RADIATION DOSE REDUCTION: This exam was performed according to the departmental dose-optimization program which includes automated exposure control, adjustment of the mA and/or kV according to patient size and/or use of iterative reconstruction technique. CONTRAST:  75mL OMNIPAQUE IOHEXOL 350 MG/ML SOLN COMPARISON:  03/01/2022 CTA head and neck, 02/07/2023 CT head FINDINGS: CT HEAD FINDINGS Brain: No evidence of acute infarct, hemorrhage, mass, mass effect, or midline shift. No hydrocephalus or extra-axial fluid collection. Vascular: No hyperdense vessel. Skull: Negative for fracture or focal lesion. Sinuses/Orbits: No acute finding.  Left globe prosthesis. Other: The mastoid air cells are well aerated. CTA HEAD AND NECK FINDINGS Evaluation is significantly limited by bolus timing, with the majority of the contrast refluxed into venous structures or not yet having reached the SVC. This study is essentially nondiagnostic with regards to the vasculature. Skeleton: No acute osseous abnormality. Degenerative changes in the cervical spine. Other neck: No acute finding. Upper chest: No focal pulmonary opacity or pleural effusion. Review of the MIP images confirms the above findings IMPRESSION: 1. No acute intracranial process. 2. Evaluation of the vasculature is significantly limited by bolus timing, with the majority of the contrast refluxed into venous structures or not yet having reached the SVC. This study is essentially nondiagnostic with regards to the vasculature. If there is persistent clinical concern for an acute intracranial process, a repeat CTA head and neck is recommended. Electronically Signed   By: Wiliam Ke M.D.   On: 03/17/2023 16:02    Procedures Procedures    Medications Ordered in ED Medications  carvedilol (COREG) tablet 12.5 mg (has no administration in time range)  meclizine (ANTIVERT) tablet 25 mg (25 mg Oral Given 03/17/23 1048)  acetaminophen (TYLENOL) tablet 1,000 mg (1,000 mg Oral Given 03/17/23 1048)  ondansetron (ZOFRAN-ODT) disintegrating tablet 4 mg (4 mg Oral Given 03/17/23 1048)  iohexol (OMNIPAQUE) 350 MG/ML injection 75 mL (75 mLs Intravenous Contrast Given 03/17/23 1450)    ED Course/ Medical Decision Making/  A&P  Medical Decision Making Amount and/or Complexity of Data Reviewed Labs: ordered. Radiology: ordered.  Risk OTC drugs. Prescription drug management.    53 year old presents for evaluation.  Please see HPI for further details.  On examination patient is afebrile, nontachycardic.  Lung sounds are clear bilaterally, not hypoxic.  Abdomen is soft and compressible.  Neurological examinations at baseline without focal neurodeficits.  5 out of 5 strength bilateral lower extremities.  5 out of 5 grip strength upper extremities.  Intact finger-nose, heel shin.  Overall nontoxic in appearance.  Will collect basic labs and assess CT angiogram of head and neck.  Will provide patient meclizine for dizziness, Zofran for nausea, Tylenol for headache.  CBC with no leukocytosis, baseline hemoglobin 8.  Patient metabolic panel shows potassium 3.  Creatinine 9.78, GFR 6, anion gap 14.  Patient reports [name] was able to withstand all of his dialysis session today except for the last 45 minutes.  During the course of patient workup, the patient was found to be a very hard stick.  It was very difficult to obtain IV access on this patient.  IV access was finally obtained utilizing ultrasound IV however during the course of the patient CT angiogram, his IV did malfunction and infiltrated.  Patient was unable to have contrast bolused for proper visualization.  Patient was sent back to ED.  Nursing staff attempted to place IV in this patient  however unsuccessful.  Will do an MRI here at this time so we will have patient undergo MRI study to rule out further causes of dizziness.  Patient MR brain, MR angio unremarkable for acute changes.  On reassessment the patient reports that his dizziness has decreased.  Patient hypertensive however has not taken nighttime dose of carvedilol.  Will provide patient with 12.5 mg of carvedilol, discharge patient.  Will have him follow-up as an outpatient with PCP.  Have encouraged him to proceed for next scheduled dialysis appointment.  All patient questions answered his satisfaction.  Will send patient home with Zofran.  Stable to discharge home.  Final Clinical Impression(s) / ED Diagnoses Final diagnoses:  Dizziness    Rx / DC Orders ED Discharge Orders          Ordered    ondansetron (ZOFRAN) 4 MG tablet  Every 6 hours PRN        03/17/23 1758              Al Decant, PA-C 03/17/23 1759    Gerhard Munch, MD 03/18/23 [phone]

## 2023-03-17 NOTE — ED Notes (Signed)
Patient's R BKA cleaned and new bandage placed.  Discharge teaching completed with family member and patient.  All questions answered.  No complaints of pain at this time

## 2023-03-17 NOTE — ED Triage Notes (Signed)
Pt arrives via [facility] from dialysis where he was getting treatment when he started to feel dizzy. Pt reports having a headache and blurred vision in the right eye states had left eye removed 2 years ago. Dialysis was stopped about 45 minutes prior to finishing. Pt is Tuesday Thursday and Saturday dialysis. Did miss Tuesday. Last was on Friday at [place] pt was in rehab r/t recent BKA on right. EMS also reports wound on patients bottom that makes it uncomfortable for him to sit. Headache started before dialysis.

## 2023-03-17 NOTE — Discharge Instructions (Signed)
It was a pleasure taking part in your care.  As we discussed, your workup is reassuring.  The MRI of your brain did not show any evidence of an acute process.  Your dizziness could be secondary to having to much volume taken off of your dialysis.  Please follow-up with your dialysis center for the next scheduled dialysis appointment.  Please do not take your nighttime dose of carvedilol as we provided to you here.  Please follow-up with your PCP for further management and care.  Return to the ED with any new or worsening symptoms.

## 2023-03-21 ENCOUNTER — Other Ambulatory Visit (HOSPITAL_BASED_OUTPATIENT_CLINIC_OR_DEPARTMENT_OTHER): Payer: Self-pay | Admitting: Interventional Cardiology

## 2023-03-22 ENCOUNTER — Ambulatory Visit (INDEPENDENT_AMBULATORY_CARE_PROVIDER_SITE_OTHER): Payer: Medicare HMO | Admitting: Family

## 2023-03-22 ENCOUNTER — Encounter: Payer: Self-pay | Admitting: Family

## 2023-03-22 DIAGNOSIS — Z89511 Acquired absence of right leg below knee: Secondary | ICD-10-CM

## 2023-03-22 DIAGNOSIS — R5381 Other malaise: Secondary | ICD-10-CM

## 2023-03-22 MED ORDER — PENTOXIFYLLINE ER 400 MG PO TBCR: 400.0000 mg | EXTENDED_RELEASE_TABLET | Freq: Three times a day (TID) | ORAL | 3 refills | Status: DC

## 2023-03-22 NOTE — Progress Notes (Signed)
Post-Op Visit Note   Patient: [name].           Date of Birth: 1970-12-09           MRN: [id number] Visit Date: 03/22/2023 PCP: [name], FNP  Chief Complaint:  Chief Complaint  Patient presents with   Right Leg - Routine Post Op    02/09/2023 right BKA     HPI:  HPI The patient is a 53 year old gentleman who is seen in follow-up status post right below-knee amputation.  Surgery on December 18.   He has a decubitus ulcer of the left heel as well they have been doing dry dressing changes  Report home health did use petrolatum gauze for his incision and after this treatment had increased drainage and maceration to the incision they would like to resume dry dressings  Unfortunately the patient is been residing at home where he feels he has 2 grade and needs to be safely.  He discharged from rehab after 19 days and currently does have home health services for wound care he has been avoiding eating due to his gastroparesis he does not want to have incontinent episodes while he is at home alone he states that he falls almost daily.  They do have a lift at home unfortunately he is not strong enough to use this himself  He and his wife are currently working on getting more help for his increased care needs Ortho Exam On examination of the right lower extremity his incision is largely covered with eschar proximally there is an area of maceration and necrotic tissue.  There is no active drainage no surrounding erythema or warmth he has diffuse tenderness On examination left heel there is a pressure injury of the skin this is 6 cm in diameter stage II. no erythema or warmth Visit Diagnoses: No diagnosis found.  Plan: Daily Dial soap cleansing of all wounds dry dressings Allevyn foam dressing to the left heel with dry dressing and shrinker to the right residual limb we will work on the social work referral to assist with skilled nursing placement  Again discussed concern for  possible further limb salvage surgery at which time he could be discharged to rehab again  Follow-Up Instructions: No follow-ups on file.   Imaging: No results found.  Orders:  No orders of the defined types were placed in this encounter.  No orders of the defined types were placed in this encounter.    PMFS History: Patient Active Problem List   Diagnosis Date Noted   Diabetic hypoglycemia (HCC) 02/13/2023   S/P BKA (below knee amputation), right (HCC) - 02-09-2023 02/09/2023   Gangrene of right foot (HCC) 02/07/2023   H/O spontaneous intraventricular intracranial hemorrhage 10/2021 - while on plavix and Eliquis 02/26/2022   History of CVA (cerebrovascular accident) - with ICH while on plavix and Eliquis in 11-2021. 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)  08/19/2020   Anxiety state 08/19/2020   Callosity 08/19/2020   ED (erectile dysfunction) of organic origin 08/19/2020   Long term (current) use of insulin (HCC) 08/19/2020   Myalgia 08/19/2020   CAD/ H/o prior STEMI 02/2020 s/p DES to LAD 08/19/2020   Blindness of left eye 08/15/2020   Combined forms of age-related cataract of right eye 04/17/2020   Hypertension secondary to other renal disorders 07/20/2019   Moderate protein-calorie malnutrition (HCC) 05/14/2019   Chronic anemia 05/07/2019   Coagulation defect, unspecified (HCC) 05/07/2019   Disorder of  phosphorus metabolism, unspecified 05/07/2019   Iron deficiency anemia, unspecified 05/07/2019   Secondary hyperparathyroidism of renal origin (HCC) 11/28/2018   Chronic Grade 2 diastolic heart failure/EF 55 to 60% 12/13/2017   Diabetes mellitus, type II (HCC) 11/23/2017   Diabetic retinopathy (HCC) 07/05/2017   Uncontrolled type 2 diabetes mellitus with hyperglycemia, with long-term current use of insulin (HCC) 04/20/2016   Mixed hyperlipidemia    Essential hypertension, benign    Past Medical History:  Diagnosis Date   Acute Bil  CVAs of Bil Frontal amd Rt Parietal  in Watershed Distribution 04/21/2021   Patchy acute cortically-based infarcts within the bilateral high  frontal lobes and right parietal lobe (predominantly in a watershed  distribution).   Subcentimeter acute infarct within the callosal splenium on the  left.     Acute bilateral cerebral infarction in a watershed distribution [facility]) 04/21/2021   a.) MRI brain 04/21/2021 --> patchy ACUTE cortically-based infarcts within the bilateral high frontal lobes and right parietal lobe   Acute cerebral infarction (HCC) 02/13/2021   a.) MRI brain 02/13/2021: acute LEFT hippocampal infarct   Acute cerebral infarction (HCC) 04/21/2021   a.) MRI brain 04/21/2021: subcentimeter ACUTE infarct within the callosal splenium on the LEFT   Acute osteomyelitis of toe of right foot with gangrene (HCC) 02/26/2022   Anaphylactic reaction due to adverse effect of correct drug or medicament properly administered, initial encounter 05/08/2019   Anemia of chronic renal failure    Anxiety    Aortic dilatation (HCC) 11/25/2020   a.) TTE 11/25/2020: Ao root measured 38 mm. b.) TTE 04/22/2021: Ao root measured 44 mm; c. 11/2021 Echo: Ao root 40mm.   Atrial fibrillation (HCC)    a.) CHA2DS2-VASc = 6 (CHF, HTN, CVA x2, prior MI, T2DM). b.) rate/rhythm maintained on oral amiodarone + carvedilol; chronic OAC/AP therapy discontinued following ICH   Benign  prostatic hyperplasia with lower urinary tract symptoms 11/07/2019   BPH (benign prostatic hyperplasia)    Cardiac arrest (HCC) 07/26/2021   a.) in setting of hyperkalemia, NSTEMI, and seizure following missed HD; pulseless and apneic --> required 1 round of CPR prior to ROSC   Cellulitis    Cerebral hemorrhage (HCC) 11/21/2021   a.) CT head 11/21/2021 --> 1.4 x 1.5 x 3.1 cm ACUTE hemorrhage within the inferior aspect of the fourth ventricle, and extending inferiorly through the foramen of foramen of [place] --> occurred in setting of HTN emergency and DOAC/APT-->eliquis/plavix d/c'd.   Cerebral microvascular disease    Chronic diarrhea    Chronic heart failure with preserved ejection fraction (HFpEF) (HCC) 07/19/2017   a.) 06/2017 Echo: EF 75%; b.) 11/2020 Echo: EF 55-60%; c.) 04/2021 Echo: EF 55-60%; d.) 11/2021 Echo: EF 65-70%, GrII DD, nl RV fxn, sev dil LA, large circumferential pericardial eff w/o tamponade, triv MR, AoV sclerosis. Ao root 40mm; e.) TTE 02/28/2025: EF 60-65%, sev LVH, mod posterior LV effusion, AoV sclerosis   Chronic pain of both  ankles    Coronary artery disease 02/26/2020   a. 02/2020 Ant STEMI/PCI: LM nl, LAD 4m (3.5x30 Resolute Onyx), RI nl, LCX min irregs, RCA min irregs. EF 65%.   DDD (degenerative disc disease), cervical    Diabetes mellitus, type II (HCC)    Diabetic neuropathy (HCC)    Esophagitis    ESRD (end stage renal disease) on dialysis (HCC)    a.) [facility]; T-Th-Sat   Frequent falls    Gait instability    Gastritis    Gastroparesis    GERD (gastroesophageal reflux disease)    H/O enucleation of left eyeball    Heart palpitations    History of kidney stones    HLD (hyperlipidemia)    Hypertension    Insomnia    a.) uses melatonin PRN   Long term current use of amiodarone    Nausea and vomiting in adult    recurrent   NSTEMI (non-ST elevated myocardial infarction) (HCC) 07/26/2021   a.) hyperkalemic from missed HD; seizures; decompensated to  cardiac arrest and required CPR x 1 round prior to ROSC.   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  Added automatically from request for surgery [id number]     Osteomyelitis [facility])    Pericardial effusion    a. 11/2021 Echo: EF 65-70%, GrII DD, nl RV fxn, sev dil LA, large circumferential pericardial eff w/o tamponade, triv MR, AoV sclerosis. Ao root 40mm.   Seizure (HCC)    a.) last 07/26/2021 in setting of missed HD --> hyperkalemic at 6.5 --> pulseless/apneic and required CPR; discharged home on levetiracetam.   ST elevation myocardial infarction (STEMI) of anterior wall (HCC) 02/26/2020   a.) LHC/PCI 02/26/2020 --> EF 65%; LVEDP 11 mmHg; 90% mLAD (3.5 x 20 mm Resolute Onyx DES x 1)    Family History  Problem Relation Age of Onset   CAD Father    Stroke Father    Diabetes Mellitus II Mother    Kidney failure Mother    Schizophrenia Mother     Past Surgical History:  Procedure Laterality Date   A/V FISTULAGRAM Left 08/31/2021   Procedure: A/V Fistulagram;  Surgeon: Annice Needy, MD;  Location: [name] INVASIVE CV LAB;  Service: Cardiovascular;  Laterality: Left;   A/V FISTULAGRAM Left 01/25/2022   Procedure: A/V Fistulagram;  Surgeon: Annice Needy, MD;  Location: [name] INVASIVE CV LAB;  Service: Cardiovascular;  Laterality: Left;   A/V FISTULAGRAM Right 10/18/2022   Procedure: A/V Fistulagram;  Surgeon: Annice Needy, MD;  Location: [name] INVASIVE CV LAB;  Service: Cardiovascular;  Laterality: Right;   ABDOMINAL AORTOGRAM W/LOWER EXTREMITY Bilateral 02/08/2023   Procedure: ABDOMINAL AORTOGRAM W/LOWER EXTREMITY;  Surgeon: Daria Pastures, MD;  Location: [facility] INVASIVE CV LAB;  Service: Vascular;  Laterality: Bilateral;   AMPUTATION Left 03/16/2016   Procedure: AMPUTATION DIGIT LEFT HALLUX;  Surgeon: Vivi Barrack, DPM;  Location: [facility] OR;  Service: Podiatry;  Laterality: Left;  can start around 5    AMPUTATION  Right 02/09/2021   Procedure: AMPUTATION DIGIT;  Surgeon: Marlyne Beards, MD;  Location: [facility] OR;  Service: Orthopedics;  Laterality: Right;   AMPUTATION Right 02/04/2023   Procedure: AMPUTATION 4TH AND 5TH METATARSAL;  Surgeon: Pilar Plate, DPM;  Location: [facility] OR;  Service: Orthopedics/Podiatry;  Laterality: Right;  I&D, partial ray resection, abx beads   AMPUTATION Right 02/09/2023   Procedure: RIGHT BELOW KNEE AMPUTATION;  Surgeon: Aldean Baker  V, MD;  Location: [facility] OR;  Service: Orthopedics;  Laterality: Right;   AMPUTATION TOE Right 02/28/2022   Procedure: AMPUTATION TOE;  Surgeon: Louann Sjogren, DPM;  Location: [name] ORS;  Service: Podiatry;  Laterality: Right;  right fifth toe   ARTHROSCOPIC REPAIR ACL Left    AV FISTULA PLACEMENT Right 05/31/2019   Procedure: Brachiocephalic AV fistula creation;  Surgeon: Annice Needy, MD;  Location: [name] ORS;  Service: Vascular;  Laterality: Right;   AV FISTULA PLACEMENT Left 08/13/2021   Procedure: ARTERIOVENOUS (AV) FISTULA CREATION (BRACHIALCEPHALIC);  Surgeon: Annice Needy, MD;  Location: [name] ORS;  Service: Vascular;  Laterality: Left;   COLONOSCOPY WITH PROPOFOL N/A 10/28/2015   Procedure: COLONOSCOPY WITH PROPOFOL;  Surgeon: Christena Deem, MD;  Location: [facility] ENDOSCOPY;  Service: Endoscopy;  Laterality: N/A;   COLONOSCOPY WITH PROPOFOL N/A 10/29/2015   Procedure: COLONOSCOPY WITH PROPOFOL;  Surgeon: Christena Deem, MD;  Location: [facility] ENDOSCOPY;  Service: Endoscopy;  Laterality: N/A;   COLONOSCOPY WITH PROPOFOL N/A 01/27/2023   Procedure: COLONOSCOPY WITH PROPOFOL;  Surgeon: Franky Macho, MD;  Location: [facility];  Service: Endoscopy;  Laterality: N/A;  12:30pm, asa 3   CORONARY ULTRASOUND/IVUS N/A 02/26/2020   Procedure: Intravascular Ultrasound/IVUS;  Surgeon: Corky Crafts, MD;  Location: [facility] INVASIVE CV LAB;  Service: Cardiovascular;  Laterality: N/A;   CORONARY/GRAFT ACUTE MI REVASCULARIZATION N/A 02/26/2020    Procedure: Coronary/Graft Acute MI Revascularization;  Surgeon: Corky Crafts, MD;  Location: [facility] INVASIVE CV LAB;  Service: Cardiovascular;  Laterality: N/A;   DIALYSIS/PERMA CATHETER INSERTION Right 04/26/2019   Perm Cath    DIALYSIS/PERMA CATHETER INSERTION N/A 04/26/2019   Procedure: DIALYSIS/PERMA CATHETER INSERTION;  Surgeon: Annice Needy, MD;  Location: [name] INVASIVE CV LAB;  Service: Cardiovascular;  Laterality: N/A;   DIALYSIS/PERMA CATHETER INSERTION N/A 05/25/2021   Procedure: DIALYSIS/PERMA CATHETER INSERTION;  Surgeon: Annice Needy, MD;  Location: [name] INVASIVE CV LAB;  Service: Cardiovascular;  Laterality: N/A;   DIALYSIS/PERMA CATHETER INSERTION N/A 08/31/2021   Procedure: DIALYSIS/PERMA CATHETER INSERTION;  Surgeon: Annice Needy, MD;  Location: [name] INVASIVE CV LAB;  Service: Cardiovascular;  Laterality: N/A;   DIALYSIS/PERMA CATHETER REMOVAL N/A 08/15/2019   Procedure: DIALYSIS/PERMA CATHETER REMOVAL;  Surgeon: Renford Dills, MD;  Location: [name] INVASIVE CV LAB;  Service: Cardiovascular;  Laterality: N/A;   DIALYSIS/PERMA CATHETER REMOVAL N/A 03/15/2022   Procedure: DIALYSIS/PERMA CATHETER REMOVAL;  Surgeon: Annice Needy, MD;  Location: [name] INVASIVE CV LAB;  Service: Cardiovascular;  Laterality: N/A;   EMBOLIZATION (CATH LAB) Right 02/06/2021   Procedure: EMBOLIZATION;  Surgeon: Annice Needy, MD;  Location: [name] INVASIVE CV LAB;  Service: Cardiovascular;  Laterality: Right;  Right Upper Extremity Dialysis Access, Permcath Placement   ESOPHAGOGASTRODUODENOSCOPY (EGD) WITH PROPOFOL N/A 12/27/2017   Procedure: ESOPHAGOGASTRODUODENOSCOPY (EGD) WITH PROPOFOL;  Surgeon: Tol[place], Boykin Nearing, MD;  Location: [name] ENDOSCOPY;  Service: Gastroenterology;  Laterality: N/A;   EYE SURGERY     LEFT HEART CATH AND CORONARY ANGIOGRAPHY N/A 02/26/2020   Procedure: LEFT HEART CATH AND CORONARY ANGIOGRAPHY;  Surgeon: Corky Crafts, MD;  Location: [facility] INVASIVE CV LAB;  Service:  Cardiovascular;  Laterality: N/A;   PROSTATE SURGERY  2016   REVISON OF ARTERIOVENOUS FISTULA Right 07/22/2022   Procedure: RESECTION OF ANEURYSMAL RIGHT ARM ARTERIOVENOUS FISTULA;  Surgeon: Annice Needy, MD;  Location: [name] ORS;  Service: Vascular;  Laterality: Right;   TONSILECTOMY/ADENOIDECTOMY WITH MYRINGOTOMY     TONSILLECTOMY     UPPER EXTREMITY  ANGIOGRAPHY Right 11/26/2020   Procedure: Upper Extremity Angiography;  Surgeon: Annice Needy, MD;  Location: [name] INVASIVE CV LAB;  Service: Cardiovascular;  Laterality: Right;   UPPER EXTREMITY ANGIOGRAPHY Right 02/05/2021   Procedure: UPPER EXTREMITY ANGIOGRAPHY;  Surgeon: Annice Needy, MD;  Location: [name] INVASIVE CV LAB;  Service: Cardiovascular;  Laterality: Right;   Social History   Occupational History   Occupation: Disability    Comment: not employed  Tobacco Use   Smoking status: Never   Smokeless tobacco: Never  Vaping Use   Vaping status: Never Used  Substance and Sexual Activity   Alcohol use: No   Drug use: No   Sexual activity: Yes

## 2023-03-24 ENCOUNTER — Inpatient Hospital Stay (HOSPITAL_COMMUNITY)
Admission: EM | Admit: 2023-03-24 | Discharge: 2023-04-14 | DRG: 939 | Disposition: A | Discharge status: Skilled Nursing Facility | Payer: Medicare HMO | Attending: Internal Medicine | Admitting: Internal Medicine

## 2023-03-24 ENCOUNTER — Emergency Department (HOSPITAL_COMMUNITY): Payer: Medicare HMO

## 2023-03-24 ENCOUNTER — Other Ambulatory Visit: Payer: Self-pay

## 2023-03-24 DIAGNOSIS — Z823 Family history of stroke: Secondary | ICD-10-CM

## 2023-03-24 DIAGNOSIS — Z87898 Personal history of other specified conditions: Secondary | ICD-10-CM

## 2023-03-24 DIAGNOSIS — Z992 Dependence on renal dialysis: Secondary | ICD-10-CM

## 2023-03-24 DIAGNOSIS — M5030 Other cervical disc degeneration, unspecified cervical region: Secondary | ICD-10-CM | POA: Diagnosis present

## 2023-03-24 DIAGNOSIS — L8990 Pressure ulcer of unspecified site, unspecified stage: Secondary | ICD-10-CM | POA: Insufficient documentation

## 2023-03-24 DIAGNOSIS — Z833 Family history of diabetes mellitus: Secondary | ICD-10-CM

## 2023-03-24 DIAGNOSIS — Z8679 Personal history of other diseases of the circulatory system: Secondary | ICD-10-CM

## 2023-03-24 DIAGNOSIS — Z7982 Long term (current) use of aspirin: Secondary | ICD-10-CM

## 2023-03-24 DIAGNOSIS — D631 Anemia in chronic kidney disease: Secondary | ICD-10-CM

## 2023-03-24 DIAGNOSIS — H5462 Unqualified visual loss, left eye, normal vision right eye: Secondary | ICD-10-CM | POA: Diagnosis present

## 2023-03-24 DIAGNOSIS — Z5320 Procedure and treatment not carried out because of patient's decision for unspecified reasons: Secondary | ICD-10-CM | POA: Diagnosis present

## 2023-03-24 DIAGNOSIS — E1136 Type 2 diabetes mellitus with diabetic cataract: Secondary | ICD-10-CM | POA: Diagnosis present

## 2023-03-24 DIAGNOSIS — H5440 Blindness, one eye, unspecified eye: Secondary | ICD-10-CM | POA: Diagnosis present

## 2023-03-24 DIAGNOSIS — F988 Other specified behavioral and emotional disorders with onset usually occurring in childhood and adolescence: Secondary | ICD-10-CM | POA: Diagnosis present

## 2023-03-24 DIAGNOSIS — K219 Gastro-esophageal reflux disease without esophagitis: Secondary | ICD-10-CM | POA: Diagnosis present

## 2023-03-24 DIAGNOSIS — Z681 Body mass index (BMI) 19 or less, adult: Secondary | ICD-10-CM

## 2023-03-24 DIAGNOSIS — K5900 Constipation, unspecified: Secondary | ICD-10-CM | POA: Diagnosis not present

## 2023-03-24 DIAGNOSIS — Z8249 Family history of ischemic heart disease and other diseases of the circulatory system: Secondary | ICD-10-CM

## 2023-03-24 DIAGNOSIS — G40909 Epilepsy, unspecified, not intractable, without status epilepticus: Secondary | ICD-10-CM | POA: Diagnosis present

## 2023-03-24 DIAGNOSIS — Z961 Presence of intraocular lens: Secondary | ICD-10-CM | POA: Diagnosis present

## 2023-03-24 DIAGNOSIS — I493 Ventricular premature depolarization: Secondary | ICD-10-CM | POA: Diagnosis present

## 2023-03-24 DIAGNOSIS — Z79899 Other long term (current) drug therapy: Secondary | ICD-10-CM

## 2023-03-24 DIAGNOSIS — Z751 Person awaiting admission to adequate facility elsewhere: Secondary | ICD-10-CM

## 2023-03-24 DIAGNOSIS — E119 Type 2 diabetes mellitus without complications: Secondary | ICD-10-CM

## 2023-03-24 DIAGNOSIS — I70261 Atherosclerosis of native arteries of extremities with gangrene, right leg: Secondary | ICD-10-CM | POA: Diagnosis not present

## 2023-03-24 DIAGNOSIS — Z818 Family history of other mental and behavioral disorders: Secondary | ICD-10-CM

## 2023-03-24 DIAGNOSIS — K529 Noninfective gastroenteritis and colitis, unspecified: Secondary | ICD-10-CM | POA: Diagnosis present

## 2023-03-24 DIAGNOSIS — I480 Paroxysmal atrial fibrillation: Secondary | ICD-10-CM | POA: Diagnosis present

## 2023-03-24 DIAGNOSIS — N400 Enlarged prostate without lower urinary tract symptoms: Secondary | ICD-10-CM | POA: Diagnosis present

## 2023-03-24 DIAGNOSIS — I2510 Atherosclerotic heart disease of native coronary artery without angina pectoris: Secondary | ICD-10-CM | POA: Diagnosis present

## 2023-03-24 DIAGNOSIS — Z9181 History of falling: Secondary | ICD-10-CM

## 2023-03-24 DIAGNOSIS — I132 Hypertensive heart and chronic kidney disease with heart failure and with stage 5 chronic kidney disease, or end stage renal disease: Secondary | ICD-10-CM | POA: Diagnosis present

## 2023-03-24 DIAGNOSIS — Z87442 Personal history of urinary calculi: Secondary | ICD-10-CM

## 2023-03-24 DIAGNOSIS — I252 Old myocardial infarction: Secondary | ICD-10-CM

## 2023-03-24 DIAGNOSIS — T8781 Dehiscence of amputation stump: Secondary | ICD-10-CM | POA: Diagnosis not present

## 2023-03-24 DIAGNOSIS — I739 Peripheral vascular disease, unspecified: Secondary | ICD-10-CM | POA: Diagnosis not present

## 2023-03-24 DIAGNOSIS — Z1152 Encounter for screening for COVID-19: Secondary | ICD-10-CM

## 2023-03-24 DIAGNOSIS — N2581 Secondary hyperparathyroidism of renal origin: Secondary | ICD-10-CM | POA: Diagnosis present

## 2023-03-24 DIAGNOSIS — E875 Hyperkalemia: Secondary | ICD-10-CM | POA: Diagnosis not present

## 2023-03-24 DIAGNOSIS — L89623 Pressure ulcer of left heel, stage 3: Secondary | ICD-10-CM | POA: Diagnosis present

## 2023-03-24 DIAGNOSIS — T8743 Infection of amputation stump, right lower extremity: Secondary | ICD-10-CM | POA: Diagnosis present

## 2023-03-24 DIAGNOSIS — E785 Hyperlipidemia, unspecified: Secondary | ICD-10-CM | POA: Diagnosis present

## 2023-03-24 DIAGNOSIS — M898X9 Other specified disorders of bone, unspecified site: Secondary | ICD-10-CM | POA: Diagnosis present

## 2023-03-24 DIAGNOSIS — Z888 Allergy status to other drugs, medicaments and biological substances status: Secondary | ICD-10-CM

## 2023-03-24 DIAGNOSIS — L89890 Pressure ulcer of other site, unstageable: Secondary | ICD-10-CM | POA: Diagnosis present

## 2023-03-24 DIAGNOSIS — Z955 Presence of coronary angioplasty implant and graft: Secondary | ICD-10-CM

## 2023-03-24 DIAGNOSIS — Z9001 Acquired absence of eye: Secondary | ICD-10-CM

## 2023-03-24 DIAGNOSIS — N186 End stage renal disease: Secondary | ICD-10-CM | POA: Diagnosis not present

## 2023-03-24 DIAGNOSIS — R5381 Other malaise: Secondary | ICD-10-CM

## 2023-03-24 DIAGNOSIS — E1122 Type 2 diabetes mellitus with diabetic chronic kidney disease: Secondary | ICD-10-CM | POA: Diagnosis present

## 2023-03-24 DIAGNOSIS — R296 Repeated falls: Secondary | ICD-10-CM | POA: Diagnosis present

## 2023-03-24 DIAGNOSIS — T8753 Necrosis of amputation stump, right lower extremity: Secondary | ICD-10-CM | POA: Diagnosis not present

## 2023-03-24 DIAGNOSIS — I10 Essential (primary) hypertension: Secondary | ICD-10-CM | POA: Diagnosis present

## 2023-03-24 DIAGNOSIS — Z8674 Personal history of sudden cardiac arrest: Secondary | ICD-10-CM

## 2023-03-24 DIAGNOSIS — E1143 Type 2 diabetes mellitus with diabetic autonomic (poly)neuropathy: Secondary | ICD-10-CM | POA: Diagnosis present

## 2023-03-24 DIAGNOSIS — H4052X4 Glaucoma secondary to other eye disorders, left eye, indeterminate stage: Secondary | ICD-10-CM | POA: Diagnosis present

## 2023-03-24 DIAGNOSIS — R531 Weakness: Secondary | ICD-10-CM | POA: Diagnosis not present

## 2023-03-24 DIAGNOSIS — Y835 Amputation of limb(s) as the cause of abnormal reaction of the patient, or of later complication, without mention of misadventure at the time of the procedure: Secondary | ICD-10-CM | POA: Diagnosis not present

## 2023-03-24 DIAGNOSIS — E1165 Type 2 diabetes mellitus with hyperglycemia: Secondary | ICD-10-CM | POA: Diagnosis not present

## 2023-03-24 DIAGNOSIS — E440 Moderate protein-calorie malnutrition: Secondary | ICD-10-CM | POA: Diagnosis present

## 2023-03-24 DIAGNOSIS — E11649 Type 2 diabetes mellitus with hypoglycemia without coma: Secondary | ICD-10-CM | POA: Diagnosis present

## 2023-03-24 DIAGNOSIS — D638 Anemia in other chronic diseases classified elsewhere: Principal | ICD-10-CM

## 2023-03-24 DIAGNOSIS — E1152 Type 2 diabetes mellitus with diabetic peripheral angiopathy with gangrene: Secondary | ICD-10-CM | POA: Diagnosis not present

## 2023-03-24 DIAGNOSIS — H538 Other visual disturbances: Secondary | ICD-10-CM

## 2023-03-24 DIAGNOSIS — E1169 Type 2 diabetes mellitus with other specified complication: Secondary | ICD-10-CM

## 2023-03-24 DIAGNOSIS — Z794 Long term (current) use of insulin: Secondary | ICD-10-CM

## 2023-03-24 DIAGNOSIS — Z841 Family history of disorders of kidney and ureter: Secondary | ICD-10-CM

## 2023-03-24 DIAGNOSIS — E113591 Type 2 diabetes mellitus with proliferative diabetic retinopathy without macular edema, right eye: Secondary | ICD-10-CM | POA: Diagnosis present

## 2023-03-24 DIAGNOSIS — H25811 Combined forms of age-related cataract, right eye: Secondary | ICD-10-CM | POA: Diagnosis present

## 2023-03-24 DIAGNOSIS — I5032 Chronic diastolic (congestive) heart failure: Secondary | ICD-10-CM | POA: Diagnosis present

## 2023-03-24 DIAGNOSIS — Z8673 Personal history of transient ischemic attack (TIA), and cerebral infarction without residual deficits: Secondary | ICD-10-CM

## 2023-03-24 DIAGNOSIS — Z5986 Financial insecurity: Secondary | ICD-10-CM

## 2023-03-24 DIAGNOSIS — K3184 Gastroparesis: Secondary | ICD-10-CM | POA: Diagnosis present

## 2023-03-24 LAB — COMPREHENSIVE METABOLIC PANEL
ALT: 14 U/L (ref 0–44)
AST: 13 U/L — ABNORMAL LOW (ref 15–41)
Albumin: 2.8 g/dL — ABNORMAL LOW (ref 3.5–5.0)
Alkaline Phosphatase: 62 U/L (ref 38–126)
Anion gap: 12 (ref 5–15)
BUN: 43 mg/dL — ABNORMAL HIGH (ref 6–20)
CO2: 23 mmol/L (ref 22–32)
Calcium: 8.8 mg/dL — ABNORMAL LOW (ref 8.9–10.3)
Chloride: 104 mmol/L (ref 98–111)
Creatinine, Ser: 7.8 mg/dL — ABNORMAL HIGH (ref 0.61–1.24)
GFR, Estimated: 8 mL/min — ABNORMAL LOW (ref >60)
Glucose, Bld: 76 mg/dL (ref 70–99)
Potassium: 4 mmol/L (ref 3.5–5.1)
Sodium: 139 mmol/L (ref 135–145)
Total Bilirubin: 0.7 mg/dL (ref 0.0–1.2)
Total Protein: 6.4 g/dL — ABNORMAL LOW (ref 6.5–8.1)

## 2023-03-24 LAB — CBC WITH DIFFERENTIAL/PLATELET
Abs Immature Granulocytes: 0.02 10*3/uL (ref 0.00–0.07)
Basophils Absolute: 0.1 10*3/uL (ref 0.0–0.1)
Basophils Relative: 1 %
Eosinophils Absolute: 0.4 10*3/uL (ref 0.0–0.5)
Eosinophils Relative: 8 %
HCT: 25.1 % — ABNORMAL LOW (ref 39.0–52.0)
Hemoglobin: 7.5 g/dL — ABNORMAL LOW (ref 13.0–17.0)
Immature Granulocytes: 0 %
Lymphocytes Relative: 30 %
Lymphs Abs: 1.6 10*3/uL (ref 0.7–4.0)
MCH: 22.5 pg — ABNORMAL LOW (ref 26.0–34.0)
MCHC: 29.9 g/dL — ABNORMAL LOW (ref 30.0–36.0)
MCV: 75.1 fL — ABNORMAL LOW (ref 80.0–100.0)
Monocytes Absolute: 0.5 10*3/uL (ref 0.1–1.0)
Monocytes Relative: 10 %
Neutro Abs: 2.7 10*3/uL (ref 1.7–7.7)
Neutrophils Relative %: 51 %
Platelets: 167 10*3/uL (ref 150–400)
RBC: 3.34 MIL/uL — ABNORMAL LOW (ref 4.22–5.81)
RDW: 21.1 % — ABNORMAL HIGH (ref 11.5–15.5)
Smear Review: ADEQUATE
WBC: 5.3 10*3/uL (ref 4.0–10.5)
nRBC: 0 % (ref 0.0–0.2)

## 2023-03-24 LAB — CBG MONITORING, ED
Glucose-Capillary: 192 mg/dL — ABNORMAL HIGH (ref 70–99)
Glucose-Capillary: 63 mg/dL — ABNORMAL LOW (ref 70–99)
Glucose-Capillary: 66 mg/dL — ABNORMAL LOW (ref 70–99)
Glucose-Capillary: 80 mg/dL (ref 70–99)

## 2023-03-24 LAB — RESP PANEL BY RT-PCR (RSV, FLU A&B, COVID)  RVPGX2
Influenza A by PCR: NEGATIVE
Influenza B by PCR: NEGATIVE
Resp Syncytial Virus by PCR: NEGATIVE
SARS Coronavirus 2 by RT PCR: NEGATIVE

## 2023-03-24 MED ORDER — TETRACAINE HCL 0.5 % OP SOLN
1.0000 [drp] | Freq: Once | OPHTHALMIC | Status: AC
  Administered 2023-03-24: 1 [drp] via OPHTHALMIC
  Filled 2023-03-24: qty 4

## 2023-03-24 MED ORDER — MIDODRINE HCL 5 MG PO TABS
5.0000 mg | ORAL_TABLET | Freq: Three times a day (TID) | ORAL | Status: DC
  Administered 2023-03-25 – 2023-04-02 (×20): 5 mg via ORAL
  Filled 2023-03-24 (×22): qty 1

## 2023-03-24 MED ORDER — HYDRALAZINE HCL 20 MG/ML IJ SOLN: 2.0000 mg | INTRAMUSCULAR | Status: DC | PRN

## 2023-03-24 MED ORDER — ATORVASTATIN CALCIUM 80 MG PO TABS
80.0000 mg | ORAL_TABLET | Freq: Every day | ORAL | Status: DC
  Administered 2023-03-25 – 2023-04-13 (×21): 80 mg via ORAL
  Filled 2023-03-24 (×2): qty 2
  Filled 2023-03-24: qty 1
  Filled 2023-03-24: qty 2
  Filled 2023-03-24: qty 1
  Filled 2023-03-24: qty 2
  Filled 2023-03-24 (×2): qty 1
  Filled 2023-03-24: qty 2
  Filled 2023-03-24 (×7): qty 1
  Filled 2023-03-24: qty 2
  Filled 2023-03-24 (×3): qty 1
  Filled 2023-03-24: qty 2

## 2023-03-24 MED ORDER — CARVEDILOL 12.5 MG PO TABS
12.5000 mg | ORAL_TABLET | Freq: Two times a day (BID) | ORAL | Status: DC
  Administered 2023-03-25 – 2023-04-13 (×37): 12.5 mg via ORAL
  Filled 2023-03-24 (×40): qty 1

## 2023-03-24 MED ORDER — ACETAMINOPHEN 500 MG PO TABS
1000.0000 mg | ORAL_TABLET | Freq: Once | ORAL | Status: AC
  Administered 2023-03-24: 1000 mg via ORAL
  Filled 2023-03-24: qty 2

## 2023-03-24 MED ORDER — JUVEN PO PACK
1.0000 | PACK | Freq: Two times a day (BID) | ORAL | Status: DC
  Administered 2023-03-25 – 2023-04-13 (×32): 1 via ORAL
  Filled 2023-03-24 (×35): qty 1

## 2023-03-24 MED ORDER — MECLIZINE HCL 12.5 MG PO TABS
25.0000 mg | ORAL_TABLET | Freq: Once | ORAL | Status: AC
  Administered 2023-03-24: 25 mg via ORAL
  Filled 2023-03-24: qty 2

## 2023-03-24 MED ORDER — AMIODARONE HCL 200 MG PO TABS
200.0000 mg | ORAL_TABLET | Freq: Every day | ORAL | Status: DC
  Administered 2023-03-25 – 2023-04-14 (×21): 200 mg via ORAL
  Filled 2023-03-24 (×22): qty 1

## 2023-03-24 MED ORDER — LEVETIRACETAM 500 MG PO TABS
500.0000 mg | ORAL_TABLET | ORAL | Status: DC
  Administered 2023-03-25 – 2023-04-03 (×7): 500 mg via ORAL
  Filled 2023-03-24 (×9): qty 1

## 2023-03-24 MED ORDER — FLUTICASONE PROPIONATE 50 MCG/ACT NA SUSP
2.0000 | Freq: Every day | NASAL | Status: DC
Start: 2023-03-24 — End: 2023-03-25

## 2023-03-24 MED ORDER — CALCITRIOL 0.5 MCG PO CAPS
2.2500 ug | ORAL_CAPSULE | ORAL | Status: DC
  Administered 2023-03-27 – 2023-04-12 (×7): 2.25 ug via ORAL
  Filled 2023-03-24: qty 1
  Filled 2023-03-24: qty 9
  Filled 2023-03-24 (×3): qty 1
  Filled 2023-03-24: qty 9
  Filled 2023-03-24: qty 1
  Filled 2023-03-24: qty 9

## 2023-03-24 MED ORDER — PROSOURCE PLUS PO LIQD
30.0000 mL | Freq: Three times a day (TID) | ORAL | Status: DC
  Administered 2023-03-25 – 2023-04-14 (×53): 30 mL via ORAL
  Filled 2023-03-24 (×56): qty 30

## 2023-03-24 MED ORDER — VITAMIN C 500 MG PO TABS
1000.0000 mg | ORAL_TABLET | Freq: Every day | ORAL | Status: DC
Start: 2023-03-24 — End: 2023-03-25

## 2023-03-24 MED ORDER — RENA-VITE PO TABS
1.0000 | ORAL_TABLET | Freq: Every day | ORAL | Status: DC
  Administered 2023-03-25 – 2023-04-13 (×20): 1 via ORAL
  Filled 2023-03-24 (×20): qty 1

## 2023-03-24 MED ORDER — PENTOXIFYLLINE ER 400 MG PO TBCR: 400.0000 mg | EXTENDED_RELEASE_TABLET | Freq: Three times a day (TID) | ORAL | Status: DC

## 2023-03-24 MED ORDER — ONDANSETRON 4 MG PO TBDP
4.0000 mg | ORAL_TABLET | Freq: Once | ORAL | Status: AC
  Administered 2023-03-24: 4 mg via ORAL
  Filled 2023-03-24: qty 1

## 2023-03-24 MED ORDER — FLUORESCEIN SODIUM 1 MG OP STRP
1.0000 | ORAL_STRIP | Freq: Once | OPHTHALMIC | Status: AC
  Administered 2023-03-24: 1 via OPHTHALMIC
  Filled 2023-03-24: qty 1

## 2023-03-24 MED ORDER — ACETAMINOPHEN 325 MG PO TABS
650.0000 mg | ORAL_TABLET | Freq: Four times a day (QID) | ORAL | Status: DC | PRN
  Administered 2023-03-25 – 2023-04-07 (×6): 650 mg via ORAL
  Filled 2023-03-24 (×8): qty 2

## 2023-03-24 MED ORDER — ASPIRIN 81 MG PO TBEC
81.0000 mg | DELAYED_RELEASE_TABLET | Freq: Every day | ORAL | Status: DC
  Administered 2023-03-25 – 2023-04-14 (×21): 81 mg via ORAL
  Filled 2023-03-24 (×21): qty 1

## 2023-03-24 MED ORDER — CALCIUM ACETATE (PHOS BINDER) 667 MG PO CAPS
1334.0000 mg | ORAL_CAPSULE | Freq: Three times a day (TID) | ORAL | Status: DC
  Administered 2023-03-25 – 2023-04-13 (×48): 1334 mg via ORAL
  Filled 2023-03-24 (×53): qty 2

## 2023-03-24 MED ORDER — AMPHETAMINE-DEXTROAMPHET ER 5 MG PO CP24
5.0000 mg | ORAL_CAPSULE | Freq: Every day | ORAL | Status: DC
Start: 2023-03-25 — End: 2023-03-25

## 2023-03-24 MED ORDER — DORZOLAMIDE HCL-TIMOLOL MAL 2-0.5 % OP SOLN
1.0000 [drp] | Freq: Two times a day (BID) | OPHTHALMIC | Status: DC
  Administered 2023-03-27 – 2023-04-14 (×36): 1 [drp] via OPHTHALMIC
  Filled 2023-03-24 (×2): qty 10

## 2023-03-24 MED ORDER — CINACALCET HCL 30 MG PO TABS
60.0000 mg | ORAL_TABLET | ORAL | Status: DC
  Administered 2023-03-26 – 2023-04-12 (×7): 60 mg via ORAL
  Filled 2023-03-24 (×8): qty 2

## 2023-03-24 MED ORDER — LEVETIRACETAM 500 MG PO TABS
750.0000 mg | ORAL_TABLET | ORAL | Status: DC
  Administered 2023-03-25 – 2023-04-01 (×4): 750 mg via ORAL
  Filled 2023-03-24 (×5): qty 1

## 2023-03-24 MED ORDER — ACETAMINOPHEN 650 MG RE SUPP: 650.0000 mg | Freq: Four times a day (QID) | RECTAL | Status: DC | PRN

## 2023-03-24 NOTE — H&P (Signed)
[name] H&P   Patient Demographics:    [name], is a 53 y.o. male  MRN: [id number]   DOB - 08-Feb-1971  Admit Date - 03/24/2023  Outpatient Primary MD for the patient is [name], FNP  Referring MD/NP/PA: PA Savannah  Outpatient Specialists: Orthopedic [name]  Patient coming from: HD center  Chief Complaint  Patient presents with   Weakness      HPI:    [name]  is a 53 y.o. male, with past medical history of recent right-sided BKA by [name], CAD, CVA, left eye enucleation, paroxysmal A-fib not on anticoagulation due to ICH, gastroparesis, diabetes mellitus, not anymore on insulin due to recurrent hypoglycemia, known right eye diabetic retinopathy and glaucoma, patient presents to ED secondary to dizziness, blurry vision and lightheadedness while in dialysis, patient report he was discharged from hospital to SNF, he was discharged there after 3 weeks, reports been home for last 10 days, but he is unable to take care of himself, too weak, wife works all the time, comes to the request placement. -In ED MRA head with no acute findings, no significant lab abnormalities, but he was noted to be extremely frail, had low CBGs initially which improved with fluid intake, he was seen by PT recommended SNF placement while in ED, so [facility] hospitalist consulted to admit to assist with placement.    Review of systems:     A full 10 point Review of Systems was done, except as stated above, all other Review of Systems were negative.   With Past History of the following :    Past Medical History:  Diagnosis Date   Acute Bil  CVAs of Bil Frontal amd Rt Parietal  in Watershed Distribution 04/21/2021   Patchy acute cortically-based infarcts within the bilateral high  frontal lobes and right parietal lobe (predominantly in a watershed  distribution).   Subcentimeter acute  infarct within the callosal splenium on the  left.     Acute bilateral cerebral infarction in a watershed distribution [facility]) 04/21/2021   a.) MRI brain 04/21/2021 --> patchy ACUTE cortically-based infarcts within the bilateral high frontal lobes and right parietal lobe   Acute cerebral infarction (HCC) 02/13/2021   a.) MRI brain 02/13/2021: acute LEFT hippocampal infarct   Acute cerebral infarction (HCC) 04/21/2021   a.) MRI brain 04/21/2021: subcentimeter ACUTE infarct within the callosal splenium on the LEFT   Acute osteomyelitis of toe of right foot with gangrene (HCC) 02/26/2022   Anaphylactic reaction due to adverse effect of correct drug or medicament properly administered, initial encounter 05/08/2019   Anemia of chronic renal failure    Anxiety    Aortic dilatation (HCC) 11/25/2020   a.) TTE 11/25/2020: Ao root measured 38 mm. b.) TTE 04/22/2021: Ao root measured 44 mm; c. 11/2021 Echo: Ao root 40mm.   Atrial fibrillation (HCC)    a.) CHA2DS2-VASc =  6 (CHF, HTN, CVA x2, prior MI, T2DM). b.) rate/rhythm maintained on oral amiodarone + carvedilol; chronic OAC/AP therapy discontinued following ICH   Benign prostatic hyperplasia with lower urinary tract symptoms 11/07/2019   BPH (benign prostatic hyperplasia)    Cardiac arrest (HCC) 07/26/2021   a.) in setting of hyperkalemia, NSTEMI, and seizure following missed HD; pulseless and apneic --> required 1 round of CPR prior to ROSC   Cellulitis    Cerebral hemorrhage (HCC) 11/21/2021   a.) CT head 11/21/2021 --> 1.4 x 1.5 x 3.1 cm ACUTE hemorrhage within the inferior aspect of the fourth ventricle, and extending inferiorly through the foramen of foramen of [place] --> occurred in setting of HTN emergency and DOAC/APT-->eliquis/plavix d/c'd.   Cerebral microvascular disease    Chronic diarrhea    Chronic heart failure with preserved ejection fraction (HFpEF) (HCC) 07/19/2017   a.) 06/2017 Echo: EF 75%; b.) 11/2020 Echo: EF 55-60%; c.) 04/2021  Echo: EF 55-60%; d.) 11/2021 Echo: EF 65-70%, GrII DD, nl RV fxn, sev dil LA, large circumferential pericardial eff w/o tamponade, triv MR, AoV sclerosis. Ao root 40mm; e.) TTE 02/28/2025: EF 60-65%, sev LVH, mod posterior LV effusion, AoV sclerosis   Chronic pain of both ankles    Coronary artery disease 02/26/2020   a. 02/2020 Ant STEMI/PCI: LM nl, LAD 21m (3.5x30 Resolute Onyx), RI nl, LCX min irregs, RCA min irregs. EF 65%.   DDD (degenerative disc disease), cervical    Diabetes mellitus, type II (HCC)    Diabetic neuropathy (HCC)    Esophagitis    ESRD (end stage renal disease) on dialysis (HCC)    a.) [facility]; T-Th-Sat   Frequent falls    Gait instability    Gastritis    Gastroparesis    GERD (gastroesophageal reflux disease)    H/O enucleation of left eyeball    Heart palpitations    History of kidney stones    HLD (hyperlipidemia)    Hypertension    Insomnia    a.) uses melatonin PRN   Long term current use of amiodarone    Nausea and vomiting in adult    recurrent   NSTEMI (non-ST elevated myocardial infarction) (HCC) 07/26/2021   a.) hyperkalemic from missed HD; seizures; decompensated to cardiac arrest and required CPR x 1 round prior to ROSC.   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  Added automatically from request for surgery [id number]     Osteomyelitis [facility])    Pericardial effusion    a. 11/2021 Echo: EF 65-70%, GrII DD, nl RV fxn, sev dil LA, large circumferential pericardial eff w/o tamponade, triv MR, AoV sclerosis. Ao root 40mm.   Seizure (HCC)    a.) last 07/26/2021 in setting of missed HD --> hyperkalemic at 6.5 --> pulseless/apneic and required CPR; discharged home on levetiracetam.   ST elevation myocardial infarction (STEMI) of anterior wall (HCC) 02/26/2020   a.) LHC/PCI 02/26/2020 --> EF 65%; LVEDP 11 mmHg; 90% mLAD (3.5 x 20 mm Resolute Onyx DES x 1)      Past  Surgical History:  Procedure Laterality Date   A/V FISTULAGRAM Left 08/31/2021   Procedure: A/V Fistulagram;  Surgeon: Annice Needy, MD;  Location: [name] INVASIVE CV LAB;  Service: Cardiovascular;  Laterality: Left;   A/V FISTULAGRAM Left 01/25/2022   Procedure: A/V Fistulagram;  Surgeon: Annice Needy, MD;  Location: [name] INVASIVE CV LAB;  Service: Cardiovascular;  Laterality: Left;  A/V FISTULAGRAM Right 10/18/2022   Procedure: A/V Fistulagram;  Surgeon: Annice Needy, MD;  Location: [name] INVASIVE CV LAB;  Service: Cardiovascular;  Laterality: Right;   ABDOMINAL AORTOGRAM W/LOWER EXTREMITY Bilateral 02/08/2023   Procedure: ABDOMINAL AORTOGRAM W/LOWER EXTREMITY;  Surgeon: Daria Pastures, MD;  Location: [facility] INVASIVE CV LAB;  Service: Vascular;  Laterality: Bilateral;   AMPUTATION Left 03/16/2016   Procedure: AMPUTATION DIGIT LEFT HALLUX;  Surgeon: Vivi Barrack, DPM;  Location: [facility] OR;  Service: Podiatry;  Laterality: Left;  can start around 5    AMPUTATION Right 02/09/2021   Procedure: AMPUTATION DIGIT;  Surgeon: Marlyne Beards, MD;  Location: [facility] OR;  Service: Orthopedics;  Laterality: Right;   AMPUTATION Right 02/04/2023   Procedure: AMPUTATION 4TH AND 5TH METATARSAL;  Surgeon: Pilar Plate, DPM;  Location: [facility] OR;  Service: Orthopedics/Podiatry;  Laterality: Right;  I&D, partial ray resection, abx beads   AMPUTATION Right 02/09/2023   Procedure: RIGHT BELOW KNEE AMPUTATION;  Surgeon: [name], MD;  Location: [facility] OR;  Service: Orthopedics;  Laterality: Right;   AMPUTATION TOE Right 02/28/2022   Procedure: AMPUTATION TOE;  Surgeon: Louann Sjogren, DPM;  Location: [name] ORS;  Service: Podiatry;  Laterality: Right;  right fifth toe   ARTHROSCOPIC REPAIR ACL Left    AV FISTULA PLACEMENT Right 05/31/2019   Procedure: Brachiocephalic AV fistula creation;  Surgeon: Annice Needy, MD;  Location: [name] ORS;  Service: Vascular;  Laterality: Right;   AV FISTULA PLACEMENT Left 08/13/2021    Procedure: ARTERIOVENOUS (AV) FISTULA CREATION (BRACHIALCEPHALIC);  Surgeon: Annice Needy, MD;  Location: [name] ORS;  Service: Vascular;  Laterality: Left;   COLONOSCOPY WITH PROPOFOL N/A 10/28/2015   Procedure: COLONOSCOPY WITH PROPOFOL;  Surgeon: Christena Deem, MD;  Location: [facility] ENDOSCOPY;  Service: Endoscopy;  Laterality: N/A;   COLONOSCOPY WITH PROPOFOL N/A 10/29/2015   Procedure: COLONOSCOPY WITH PROPOFOL;  Surgeon: Christena Deem, MD;  Location: [facility] ENDOSCOPY;  Service: Endoscopy;  Laterality: N/A;   COLONOSCOPY WITH PROPOFOL N/A 01/27/2023   Procedure: COLONOSCOPY WITH PROPOFOL;  Surgeon: Franky Macho, MD;  Location: [facility]O [facility];  Service: Endoscopy;  Laterality: N/A;  12:30pm, asa 3   CORONARY ULTRASOUND/IVUS N/A 02/26/2020   Procedure: Intravascular Ultrasound/IVUS;  Surgeon: Corky Crafts, MD;  Location: [facility] INVASIVE CV LAB;  Service: Cardiovascular;  Laterality: N/A;   CORONARY/GRAFT ACUTE MI REVASCULARIZATION N/A 02/26/2020   Procedure: Coronary/Graft Acute MI Revascularization;  Surgeon: Corky Crafts, MD;  Location: [facility] INVASIVE CV LAB;  Service: Cardiovascular;  Laterality: N/A;   DIALYSIS/PERMA CATHETER INSERTION Right 04/26/2019   Perm Cath    DIALYSIS/PERMA CATHETER INSERTION N/A 04/26/2019   Procedure: DIALYSIS/PERMA CATHETER INSERTION;  Surgeon: Annice Needy, MD;  Location: [name] INVASIVE CV LAB;  Service: Cardiovascular;  Laterality: N/A;   DIALYSIS/PERMA CATHETER INSERTION N/A 05/25/2021   Procedure: DIALYSIS/PERMA CATHETER INSERTION;  Surgeon: Annice Needy, MD;  Location: [name] INVASIVE CV LAB;  Service: Cardiovascular;  Laterality: N/A;   DIALYSIS/PERMA CATHETER INSERTION N/A 08/31/2021   Procedure: DIALYSIS/PERMA CATHETER INSERTION;  Surgeon: Annice Needy, MD;  Location: [name] INVASIVE CV LAB;  Service: Cardiovascular;  Laterality: N/A;   DIALYSIS/PERMA CATHETER REMOVAL N/A 08/15/2019   Procedure: DIALYSIS/PERMA CATHETER REMOVAL;  Surgeon: Renford Dills, MD;  Location: [name] INVASIVE CV LAB;  Service: Cardiovascular;  Laterality: N/A;   DIALYSIS/PERMA CATHETER REMOVAL N/A 03/15/2022   Procedure: DIALYSIS/PERMA CATHETER REMOVAL;  Surgeon: Annice Needy, MD;  Location: [name] INVASIVE CV LAB;  Service:  Cardiovascular;  Laterality: N/A;   EMBOLIZATION (CATH LAB) Right 02/06/2021   Procedure: EMBOLIZATION;  Surgeon: Annice Needy, MD;  Location: [name] INVASIVE CV LAB;  Service: Cardiovascular;  Laterality: Right;  Right Upper Extremity Dialysis Access, Permcath Placement   ESOPHAGOGASTRODUODENOSCOPY (EGD) WITH PROPOFOL N/A 12/27/2017   Procedure: ESOPHAGOGASTRODUODENOSCOPY (EGD) WITH PROPOFOL;  Surgeon: Toledo, Boykin Nearing, MD;  Location: [name] ENDOSCOPY;  Service: Gastroenterology;  Laterality: N/A;   EYE SURGERY     LEFT HEART CATH AND CORONARY ANGIOGRAPHY N/A 02/26/2020   Procedure: LEFT HEART CATH AND CORONARY ANGIOGRAPHY;  Surgeon: Corky Crafts, MD;  Location: [facility] INVASIVE CV LAB;  Service: Cardiovascular;  Laterality: N/A;   PROSTATE SURGERY  2016   REVISON OF ARTERIOVENOUS FISTULA Right 07/22/2022   Procedure: RESECTION OF ANEURYSMAL RIGHT ARM ARTERIOVENOUS FISTULA;  Surgeon: Annice Needy, MD;  Location: [name] ORS;  Service: Vascular;  Laterality: Right;   TONSILECTOMY/ADENOIDECTOMY WITH MYRINGOTOMY     TONSILLECTOMY     UPPER EXTREMITY ANGIOGRAPHY Right 11/26/2020   Procedure: Upper Extremity Angiography;  Surgeon: Annice Needy, MD;  Location: [name] INVASIVE CV LAB;  Service: Cardiovascular;  Laterality: Right;   UPPER EXTREMITY ANGIOGRAPHY Right 02/05/2021   Procedure: UPPER EXTREMITY ANGIOGRAPHY;  Surgeon: Annice Needy, MD;  Location: [name] INVASIVE CV LAB;  Service: Cardiovascular;  Laterality: Right;      Social History:     Social History   Tobacco Use   Smoking status: Never   Smokeless tobacco: Never  Substance Use Topics   Alcohol use: No       Family History :     Family History  Problem Relation Age of Onset    CAD Father    Stroke Father    Diabetes Mellitus II Mother    Kidney failure Mother    Schizophrenia Mother      Home Medications:   Prior to Admission medications   Medication Sig Start Date End Date Taking? Authorizing Provider  acetaminophen (TYLENOL) 325 MG tablet Take 1-2 tablets (325-650 mg total) by mouth every 4 (four) hours as needed for mild pain. 05/04/21   Love, Evlyn Kanner, PA-C  amiodarone (PACERONE) 200 MG tablet Take 1 tablet (200 mg total) by mouth every morning. 08/02/22 08/03/23  Corky Crafts, MD  ammonium lactate (AMLACTIN) 12 % cream Apply 1 Application topically as needed for dry skin. 05/25/22   Vivi Barrack, DPM  amphetamine-dextroamphetamine (ADDERALL XR) 5 MG 24 hr capsule Take 1 capsule (5 mg total) by mouth daily. 02/23/23   Danford, Earl Lites, MD  ascorbic acid (VITAMIN C) 1000 MG tablet Take 1 tablet (1,000 mg total) by mouth daily. 02/24/23   Danford, Earl Lites, MD  aspirin EC 81 MG tablet Take 1 tablet (81 mg total) by mouth daily. Swallow whole. 02/24/23   Alberteen Sam, MD  atorvastatin (LIPITOR) 80 MG tablet TAKE ONE TABLET BY MOUTH AT BEDTIME. 03/22/23   Gaston Islam., NP  calcitRIOL (ROCALTROL) 0.25 MCG capsule Take 9 capsules (2.25 mcg total) by mouth every Tuesday, Thursday, and Saturday at 6 PM. 02/24/23   Danford, Earl Lites, MD  calcium acetate (PHOSLO) 667 MG capsule Take 2 capsules (1,334 mg total) by mouth 3 (three) times daily with meals. 05/04/21   Love, Evlyn Kanner, PA-C  carvedilol (COREG) 12.5 MG tablet Take 1 tablet (12.5 mg total) by mouth 2 (two) times daily. 02/23/23   Danford, Earl Lites, MD  cinacalcet (SENSIPAR) 30 MG tablet Take 2 tablets (60  mg total) by mouth every Tuesday, Thursday, and Saturday at 6 PM. 02/24/23   Danford, Earl Lites, MD  clindamycin (CLEOCIN) 300 MG capsule Take 300 mg by mouth 3 (three) times daily. 03/21/23 03/28/23  [provider]  dorzolamide-timolol (COSOPT) 2-0.5 % ophthalmic  solution Place 1 drop into the right eye 2 (two) times daily. 11/15/22 11/15/23  [provider]  fluticasone (FLONASE) 50 MCG/ACT nasal spray Place 2 sprays into both nostrils daily. 10/05/22   Leath-Warren, Sadie Haber, NP  HYDROcodone-acetaminophen (NORCO/VICODIN) 5-325 MG tablet Take 1 tablet by mouth every 6 (six) hours as needed for moderate pain (pain score 4-6). 02/23/23   Danford, Earl Lites, MD  hydrocortisone (ANUSOL-HC) 2.5 % rectal cream Place 1 Application rectally 2 (two) times daily. Patient not taking: Reported on 02/03/2023 10/05/22   Leath-Warren, Sadie Haber, NP  levETIRAcetam (KEPPRA) 500 MG tablet Take 1 tablet (500 mg total) by mouth 4 (four) times a week. On Sunday, Monday, Wednesday and Friday, all non-dialysis days 02/25/23   Alberteen Sam, MD  levETIRAcetam (KEPPRA) 750 MG tablet Take 1 tablet (750 mg total) by mouth 3 (three) times a week. On Tuesday Thursday and Saturday, on dialysis days 02/24/23   Alberteen Sam, MD  lidocaine-prilocaine (EMLA) cream Apply topically. 12/24/21   [provider]  loperamide (IMODIUM) 2 MG capsule Take 1 capsule (2 mg total) by mouth as needed for diarrhea or loose stools. 02/23/23   Danford, Earl Lites, MD  midodrine (PROAMATINE) 5 MG tablet Take 1 tablet (5 mg total) by mouth 3 (three) times daily with meals. 02/23/23   Danford, Earl Lites, MD  multivitamin (RENA-VIT) TABS tablet Take 1 tablet by mouth at bedtime. 02/23/23   Danford, Earl Lites, MD  nutrition supplement, JUVEN, (JUVEN) PACK Take 1 packet by mouth 2 (two) times daily between meals. 02/23/23   Danford, Earl Lites, MD  Nutritional Supplements (,FEEDING SUPPLEMENT, PROSOURCE PLUS) liquid Take 30 mLs by mouth 3 (three) times daily between meals. 02/23/23   Danford, Earl Lites, MD  ondansetron (ZOFRAN) 4 MG tablet Take 1 tablet (4 mg total) by mouth every 6 (six) hours as needed for nausea or vomiting. 03/17/23   Al Decant, PA-C   pentoxifylline (TRENTAL) 400 MG CR tablet Take 1 tablet (400 mg total) by mouth 3 (three) times daily with meals. 03/22/23   Adonis Huguenin, NP  psyllium (METAMUCIL) 58.6 % packet Take 1 packet by mouth 2 (two) times daily. Patient taking differently: Take 1 packet by mouth 2 (two) times daily. New medication from Dr. Burgess Estelle, have to pick up at Pharmacy. 01/27/23 04/27/23  Franky Macho, MD     Allergies:     Allergies  Allergen Reactions   Promethazine Diarrhea and Other (See Comments)    Muscle cramps   Promethazine Hcl Other (See Comments)    Cramping all over  Other reaction(s): cramp all over   Other     Other reaction(s): Unknown Other reaction(s): Unknown     Physical Exam:   Vitals  Blood pressure (!) 147/79, pulse 64, temperature 97.7 F (36.5 C), temperature source Oral, resp. rate 10, height 6\' 3"  (1.905 m), weight 79.4 kg, SpO2 100%.   1. General Well, ill-appearing male, laying in bed in no apparent distress  2. Normal affect and insight, Not Suicidal or Homicidal, Awake Alert, Oriented X 3.  3. No F.N deficits, ALL C.Nerves Intact, Strength 5/5 all 4 extremities, Sensation intact all 4 extremities, Plantars down going.  4. Ears Normal, left eye short/enucleated, right eye conjunctivae clear,  Moist Oral Mucosa.  5. Supple Neck, No JVD, No cervical lymphadenopathy appriciated, No Carotid Bruits.  6. Symmetrical Chest wall movement, Good air movement bilaterally, CTAB.  7. RRR, No Gallops, Rubs or Murmurs, No Parasternal Heave.  8. Positive Bowel Sounds, Abdomen Soft, No tenderness, No organomegaly appriciated,No rebound -guarding or rigidity.  9.  No Cyanosis, Normal Skin Turgor, No Skin Rash or Bruise.  10. Good muscle tone,  joints appear normal , right BKA, bandaged     Data Review:    CBC Recent Labs  Lab 03/24/23 1034  WBC 5.3  HGB 7.5*  HCT 25.1*  PLT 167  MCV 75.1*  MCH 22.5*  MCHC 29.9*  RDW 21.1*  LYMPHSABS 1.6  MONOABS 0.5   EOSABS 0.4  BASOSABS 0.1   ------------------------------------------------------------------------------------------------------------------  Chemistries  Recent Labs  Lab 03/24/23 1034  NA 139  K 4.0  CL 104  CO2 23  GLUCOSE 76  BUN 43*  CREATININE 7.80*  CALCIUM 8.8*  AST 13*  ALT 14  ALKPHOS 62  BILITOT 0.7   ------------------------------------------------------------------------------------------------------------------ estimated creatinine clearance is 12 mL/min (A) (by C-G formula based on SCr of 7.8 mg/dL (H)). ------------------------------------------------------------------------------------------------------------------ No results for input(s): "TSH", "T4TOTAL", "T3FREE", "THYROIDAB" in the last 72 hours.  Invalid input(s): "FREET3"  Coagulation profile No results for input(s): "INR", "PROTIME" in the last 168 hours. ------------------------------------------------------------------------------------------------------------------- No results for input(s): "DDIMER" in the last 72 hours. -------------------------------------------------------------------------------------------------------------------  Cardiac Enzymes No results for input(s): "CKMB", "TROPONINI", "MYOGLOBIN" in the last 168 hours.  Invalid input(s): "CK" ------------------------------------------------------------------------------------------------------------------    Component Value Date/Time   BNP 1,416.0 (H) 11/21/2021 1117     ---------------------------------------------------------------------------------------------------------------  Urinalysis    Component Value Date/Time   COLORURINE YELLOW (A) 11/07/2017 1855   APPEARANCEUR CLOUDY (A) 11/07/2017 1855   LABSPEC 1.016 11/07/2017 1855   PHURINE 5.0 11/07/2017 1855   GLUCOSEU >=500 (A) 11/07/2017 1855   HGBUR MODERATE (A) 11/07/2017 1855   BILIRUBINUR NEGATIVE 11/07/2017 1855   KETONESUR NEGATIVE 11/07/2017 1855    PROTEINUR >=300 (A) 11/07/2017 1855   NITRITE NEGATIVE 11/07/2017 1855   LEUKOCYTESUR NEGATIVE 11/07/2017 1855    ----------------------------------------------------------------------------------------------------------------   Imaging Results:    MR ANGIO HEAD WO CONTRAST Result Date: 03/24/2023 CLINICAL DATA:  Vision loss, monocular EXAM: MRA HEAD WITHOUT CONTRAST TECHNIQUE: Angiographic images of the Circle of Willis were acquired using MRA technique without intravenous contrast. COMPARISON:  03/17/2023 MRA FINDINGS: Anterior circulation: Both internal carotid arteries are patent to the termini, without significant stenosis. A1 segments patent. Normal anterior communicating artery. Possible focal stenosis in the left A3 (series 3, image 120) is favored to be artifactual given motion artifact in several vessels at that location; on the prior exam, there was no motion in this location and no stenosis. Anterior cerebral arteries are patent to their distal aspects without significant stenosis. No M1 stenosis or occlusion. Distal MCA branches perfused to their distal aspects without significant stenosis. Posterior circulation: Vertebral arteries patent to the vertebrobasilar junction without stenosis. Posterior inferior cerebral arteries patent bilaterally. Basilar patent to its distal aspect. Superior cerebellar arteries patent proximally. Patent P1 segments. PCAs perfused to their distal aspects without significant stenosis. The right posterior communicating artery is patent. Anatomic variants: None significant IMPRESSION: No intracranial large vessel occlusion or significant stenosis. Electronically Signed   By: Wiliam Ke M.D.   On: 03/24/2023 13:30      Assessment & Plan:    Principal Problem:  Weakness Active Problems:   PAD (peripheral artery disease) (HCC)   Essential hypertension, benign   Uncontrolled type 2 diabetes mellitus with hyperglycemia, with long-term current use of insulin  (HCC)   Paroxysmal atrial fibrillation (HCC) - not on systemic anticoagulation due to ICH while on Eliquis in 11-2021.   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   History of seizure   Chronic Grade 2 diastolic heart failure/EF 55 to 60%   CAD/ H/o prior STEMI 02/2020 s/p DES to LAD   Diabetes mellitus, type II (HCC)   Moderate protein-calorie malnutrition (HCC)   Blindness of left eye   Combined forms of age-related cataract of right eye    Generalized weakness Deconditioning Dizziness -Patient with recent prolonged hospital stay, but subacute rehab, was just discharged 10 days ago, presents with these complaints. -PT consulted in ED, recommendation for SNF placement, TOC is following -Patient with recent left BKA, he will benefit with subacute rehab placement, -Continue with PT/OT -Patient with anemia as well, he will benefit from blood transfusion to keep hemoglobin> 8, he is not making urine, so 1 unit PRBC can be ordered if he is here till Saturday. -MRI head with no acute findings  Right eye blurry vision -[facility] with ophthalmology at [facility], he is already known to have NVD left eye status post enucleation -Reports progressive blurry vision over the right eye, known to have history of pseudophakia, cataract and Quiescent progressive diabetic retinopathy in the right eye, with evidence of ischemia, to have guarding prognosis -ED discussed with ophthalmology on-call, acute intervention needed, he needs to follow-up with his neurology team as an outpatient.    Gangrene of right foot (HCC)S/P BKA (below knee amputation), right [facility]) - 02-09-2023 -Wound is currently bandaged, patient is following fluid office, was just recently seen there,   documentation from ortho office visit 1/28    Ortho Exam On examination of the right lower extremity his incision is largely covered with eschar proximally there is an area of maceration and necrotic tissue.  There is no active drainage no surrounding erythema or warmth he has diffuse tenderness On examination left heel there is a pressure injury of the skin this is 6 cm in diameter stage II. no erythema or warmth Visit Diagnoses: No diagnosis found.  Plan: Daily Dial soap cleansing of all wounds dry dressings Allevyn foam dressing to the left heel with dry dressing and shrinker to the right residual limb we will work on the social work referral to assist with skilled nursing placement  -Will reorder of recommended wound care during hospital stay   PAD (peripheral artery disease) (HCC) Peripheral vascular disease History of spontaneous intraventricular hemorrhage Essential hypertension Coronary artery disease On low-dose aspirin, Lipitor, Coreg   History of seizure No seizures observed here.  Continue Keppra lower dose on non-dialysis days, 750 on dialysis days.  Follow-up with neurology as an outpatient   ESRD on hemodialysis [facility]) -On hemodialysis TTS schedule, he was dialyzed today, next dialysis on Saturday, patient is not discharged tomorrow will have morning team to place a consult for renal to dialyze on Saturday.   Anemia due to chronic kidney disease, on chronic dialysis (HCC) -hemoglobin 7.5, he is with generalized weakness and dizziness, he will benefit from manage PRBC  transfusion to target hemoglobin> 8, it is agreeable for transfusion, so can receive 1 unit PRBC if he is he will call Saturday for HD -Procrit and IV iron per renal    Paroxysmal atrial fibrillation (HCC)  - not on systemic anticoagulation due to ICH while on Eliquis in 11-2021. - On amiodarone, carvedilol, stable.   Uncontrolled type 2 diabetes mellitus with hyperglycemia and hypoglycemia, -His blood  sugar was on the lower side when he came, he did not require any insulin during given recent hospitalization for low CBGs, so will monitor CBG and if they started to increase then will start on sliding scale   ADD On Adderall     DVT Prophylaxis SCDs   AM Labs Ordered, also please review Full Orders  Family Communication: Admission, patients condition and plan of care including tests being ordered have been discussed with the patient and wife who indicate understanding and agree with the plan and Code Status.  Code Status full code  Likely DC to need subacute rehab placement Consults called: ED discussed with ophthalmology  Admission status: Observation  Time spent in minutes : 70 minutes   Huey Bienenstock M.D on 03/24/2023 at 9:08 PM   [facility] - Office  [phone]

## 2023-03-24 NOTE — [place] FL2 (Signed)
[place] MEDICAID FL2 LEVEL OF CARE FORM     IDENTIFICATION  Patient Name: [name]. Birthdate: 28-Jul-1970 Sex: male Admission Date (Current Location): 03/24/2023  [place] and [place] Number:  [employer] and Address:  [facility],  [address], [name] [address]      Provider Number: [id number]  Attending Physician Name and Address:  Terrilee Files, MD  Relative Name and Phone Number:  [name] ( spouse ) [phone]    Current Level of Care: Hospital Recommended Level of Care: Skilled Nursing Facility Prior Approval Number:    Date Approved/Denied:   PASRR Number: [id number]  Discharge Plan: SNF    Current Diagnoses: Patient Active Problem List   Diagnosis Date Noted   Diabetic hypoglycemia (HCC) 02/13/2023   S/P BKA (below knee amputation), right [facility]) - 02-09-2023 02/09/2023   Gangrene of right foot (HCC) 02/07/2023   H/O spontaneous intraventricular intracranial hemorrhage 10/2021 - while on plavix and Eliquis 02/26/2022   History of CVA (cerebrovascular accident) - with ICH while on plavix and Eliquis in 11-2021. 02/26/2022   H/O enucleation of left eyeball 02/26/2022   PAD (peripheral artery disease) (HCC) 02/26/2022   History of seizure 02/26/2022   Seasonal allergic rhinitis 07/08/2021   Myoclonus 06/23/2021   Exposure of orbital implant 03/16/2021   Steal syndrome as complication of dialysis access [facility]) 02/06/2021   Coronary artery disease 02/05/2021   ESRD on hemodialysis (HCC)    Anemia due to chronic kidney disease, on chronic dialysis (HCC)    Paroxysmal atrial fibrillation (HCC) - not on systemic anticoagulation due to ICH while on Eliquis in 11-2021. 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) 08/19/2020   Anxiety state 08/19/2020   Callosity 08/19/2020   ED (erectile dysfunction) of organic origin 08/19/2020   Long term (current) use of insulin (HCC) 08/19/2020   Myalgia 08/19/2020    CAD/ H/o prior STEMI 02/2020 s/p DES to LAD 08/19/2020   Blindness of left eye 08/15/2020   Combined forms of age-related cataract of right eye 04/17/2020   Hypertension secondary to other renal disorders 07/20/2019   Moderate protein-calorie malnutrition (HCC) 05/14/2019   Chronic anemia 05/07/2019   Coagulation defect, unspecified (HCC) 05/07/2019   Disorder of phosphorus metabolism, unspecified 05/07/2019   Iron deficiency anemia, unspecified 05/07/2019   Secondary hyperparathyroidism of renal origin (HCC) 11/28/2018   Chronic Grade 2 diastolic heart failure/EF 55 to 60% 12/13/2017   Diabetes mellitus, type II (HCC) 11/23/2017   Diabetic retinopathy (HCC) 07/05/2017   Uncontrolled type 2 diabetes mellitus with hyperglycemia, with long-term current use of insulin (HCC) 04/20/2016   Mixed hyperlipidemia    Essential hypertension, benign     Orientation RESPIRATION BLADDER Height & Weight     Self, Time, Situation, Place  Normal Continent Weight: 175 lb (79.4 kg) Height:  6\' 3"  (190.5 cm)  BEHAVIORAL SYMPTOMS/MOOD NEUROLOGICAL BOWEL NUTRITION STATUS      Continent Diet (See AVS)  AMBULATORY STATUS COMMUNICATION OF NEEDS Skin   Extensive Assist Verbally Normal                       Personal Care Assistance Level of Assistance  Bathing, Feeding, Dressing Bathing Assistance: Maximum assistance Feeding assistance: Independent Dressing Assistance: Maximum assistance     Functional Limitations Info  Sight, Hearing, Speech Sight Info: Adequate Hearing Info: Adequate Speech Info: Adequate    SPECIAL CARE FACTORS FREQUENCY  PT (By licensed PT), OT (By  licensed OT)     PT Frequency: 5 x a week OT Frequency: 5 x a week            Contractures Contractures Info: Not present    Additional Factors Info  Code Status, Allergies Code Status Info: FULL Allergies Info: Promethazine , Promethazine Hcl           Current Medications (03/24/2023):  This is the current  hospital active medication list No current facility-administered medications for this encounter.   Current Outpatient Medications  Medication Sig Dispense Refill   acetaminophen (TYLENOL) 325 MG tablet Take 1-2 tablets (325-650 mg total) by mouth every 4 (four) hours as needed for mild pain.     amiodarone (PACERONE) 200 MG tablet Take 1 tablet (200 mg total) by mouth every morning. 90 tablet 2   ammonium lactate (AMLACTIN) 12 % cream Apply 1 Application topically as needed for dry skin. 385 g 0   amphetamine-dextroamphetamine (ADDERALL XR) 5 MG 24 hr capsule Take 1 capsule (5 mg total) by mouth daily. 5 capsule 0   ascorbic acid (VITAMIN C) 1000 MG tablet Take 1 tablet (1,000 mg total) by mouth daily.     aspirin EC 81 MG tablet Take 1 tablet (81 mg total) by mouth daily. Swallow whole.     atorvastatin (LIPITOR) 80 MG tablet TAKE ONE TABLET BY MOUTH AT BEDTIME. 90 tablet 2   calcitRIOL (ROCALTROL) 0.25 MCG capsule Take 9 capsules (2.25 mcg total) by mouth every Tuesday, Thursday, and Saturday at 6 PM.     calcium acetate (PHOSLO) 667 MG capsule Take 2 capsules (1,334 mg total) by mouth 3 (three) times daily with meals. 180 capsule 0   carvedilol (COREG) 12.5 MG tablet Take 1 tablet (12.5 mg total) by mouth 2 (two) times daily.     cinacalcet (SENSIPAR) 30 MG tablet Take 2 tablets (60 mg total) by mouth every Tuesday, Thursday, and Saturday at 6 PM.     dorzolamide-timolol (COSOPT) 2-0.5 % ophthalmic solution Place 1 drop into the right eye 2 (two) times daily.     fluticasone (FLONASE) 50 MCG/ACT nasal spray Place 2 sprays into both nostrils daily. 16 g 0   HYDROcodone-acetaminophen (NORCO/VICODIN) 5-325 MG tablet Take 1 tablet by mouth every 6 (six) hours as needed for moderate pain (pain score 4-6). 12 tablet 0   hydrocortisone (ANUSOL-HC) 2.5 % rectal cream Place 1 Application rectally 2 (two) times daily. (Patient not taking: Reported on 02/03/2023) 30 g 0   levETIRAcetam (KEPPRA) 500 MG  tablet Take 1 tablet (500 mg total) by mouth 4 (four) times a week. On Sunday, Monday, Wednesday and Friday, all non-dialysis days     levETIRAcetam (KEPPRA) 750 MG tablet Take 1 tablet (750 mg total) by mouth 3 (three) times a week. On Tuesday Thursday and Saturday, on dialysis days     lidocaine-prilocaine (EMLA) cream Apply topically.     loperamide (IMODIUM) 2 MG capsule Take 1 capsule (2 mg total) by mouth as needed for diarrhea or loose stools.     midodrine (PROAMATINE) 5 MG tablet Take 1 tablet (5 mg total) by mouth 3 (three) times daily with meals.     multivitamin (RENA-VIT) TABS tablet Take 1 tablet by mouth at bedtime.     nutrition supplement, JUVEN, (JUVEN) PACK Take 1 packet by mouth 2 (two) times daily between meals.     Nutritional Supplements (,FEEDING SUPPLEMENT, PROSOURCE PLUS) liquid Take 30 mLs by mouth 3 (three) times daily between meals.  ondansetron (ZOFRAN) 4 MG tablet Take 1 tablet (4 mg total) by mouth every 6 (six) hours as needed for nausea or vomiting. 12 tablet 0   pentoxifylline (TRENTAL) 400 MG CR tablet Take 1 tablet (400 mg total) by mouth 3 (three) times daily with meals. 90 tablet 3   psyllium (METAMUCIL) 58.6 % packet Take 1 packet by mouth 2 (two) times daily. (Patient taking differently: Take 1 packet by mouth 2 (two) times daily. New medication from Dr. Burgess Estelle, have to pick up at Pharmacy.) 60 packet 2     Discharge Medications: Please see after visit summary for a list of discharge medications.  Relevant Imaging Results:  Relevant Lab Results:   Additional Information SSN [id number]  //  [facility] in [facility] [place], [place]

## 2023-03-24 NOTE — ED Provider Notes (Signed)
[place] EMERGENCY DEPARTMENT AT [facility] Provider Note   CSN: [id number] Arrival date & time: 03/24/23  4098     History  Chief Complaint  Patient presents with   Weakness    [name]. is a 53 y.o. male with PMHx right-sided BKA secondary to foot infection (01/2023), NSTEMI, CVA, left eye removal, paroxysmal A-fib not on anticoagulation, gastroparesis who presents to ED concerned for dizziness and blurry vision during dialysis treatment today. Patient also with a headache that started when he woke up this morning. Patient stating that these symptoms are baseline for him but the blurry vision was worse during dialysis today. Patient also stating that he was discharged from SNF recently and states that he is too weak to take care of himself after his recent BKA. Patient also stating that he has had problems with diarrhea over the past year.   HPI     Home Medications Prior to Admission medications   Medication Sig Start Date End Date Taking? Authorizing Provider  acetaminophen (TYLENOL) 325 MG tablet Take 1-2 tablets (325-650 mg total) by mouth every 4 (four) hours as needed for mild pain. 05/04/21   Love, Evlyn Kanner, PA-C  amiodarone (PACERONE) 200 MG tablet Take 1 tablet (200 mg total) by mouth every morning. 08/02/22 08/03/23  Corky Crafts, MD  ammonium lactate (AMLACTIN) 12 % cream Apply 1 Application topically as needed for dry skin. 05/25/22   Vivi Barrack, DPM  amphetamine-dextroamphetamine (ADDERALL XR) 5 MG 24 hr capsule Take 1 capsule (5 mg total) by mouth daily. 02/23/23   Danford, Earl Lites, MD  ascorbic acid (VITAMIN C) 1000 MG tablet Take 1 tablet (1,000 mg total) by mouth daily. 02/24/23   Danford, Earl Lites, MD  aspirin EC 81 MG tablet Take 1 tablet (81 mg total) by mouth daily. Swallow whole. 02/24/23   Alberteen Sam, MD  atorvastatin (LIPITOR) 80 MG tablet TAKE ONE TABLET BY MOUTH AT BEDTIME. 03/22/23   Gaston Islam., NP   calcitRIOL (ROCALTROL) 0.25 MCG capsule Take 9 capsules (2.25 mcg total) by mouth every Tuesday, Thursday, and Saturday at 6 PM. 02/24/23   Danford, Earl Lites, MD  calcium acetate (PHOSLO) 667 MG capsule Take 2 capsules (1,334 mg total) by mouth 3 (three) times daily with meals. 05/04/21   Love, Evlyn Kanner, PA-C  carvedilol (COREG) 12.5 MG tablet Take 1 tablet (12.5 mg total) by mouth 2 (two) times daily. 02/23/23   Alberteen Sam, MD  cinacalcet (SENSIPAR) 30 MG tablet Take 2 tablets (60 mg total) by mouth every Tuesday, Thursday, and Saturday at 6 PM. 02/24/23   Danford, Earl Lites, MD  clindamycin (CLEOCIN) 300 MG capsule Take 300 mg by mouth 3 (three) times daily. 03/21/23 03/28/23  [provider]  dorzolamide-timolol (COSOPT) 2-0.5 % ophthalmic solution Place 1 drop into the right eye 2 (two) times daily. 11/15/22 11/15/23  [provider]  fluticasone (FLONASE) 50 MCG/ACT nasal spray Place 2 sprays into both nostrils daily. 10/05/22   Leath-Warren, Sadie Haber, NP  HYDROcodone-acetaminophen (NORCO/VICODIN) 5-325 MG tablet Take 1 tablet by mouth every 6 (six) hours as needed for moderate pain (pain score 4-6). 02/23/23   Danford, Earl Lites, MD  hydrocortisone (ANUSOL-HC) 2.5 % rectal cream Place 1 Application rectally 2 (two) times daily. Patient not taking: Reported on 02/03/2023 10/05/22   Leath-Warren, Sadie Haber, NP  levETIRAcetam (KEPPRA) 500 MG tablet Take 1 tablet (500 mg total) by mouth 4 (four) times a  week. On Sunday, Monday, Wednesday and Friday, all non-dialysis days 02/25/23   Alberteen Sam, MD  levETIRAcetam (KEPPRA) 750 MG tablet Take 1 tablet (750 mg total) by mouth 3 (three) times a week. On Tuesday Thursday and Saturday, on dialysis days 02/24/23   Alberteen Sam, MD  lidocaine-prilocaine (EMLA) cream Apply topically. 12/24/21   [provider]  loperamide (IMODIUM) 2 MG capsule Take 1 capsule (2 mg total) by mouth as needed for diarrhea  or loose stools. 02/23/23   Danford, Earl Lites, MD  midodrine (PROAMATINE) 5 MG tablet Take 1 tablet (5 mg total) by mouth 3 (three) times daily with meals. 02/23/23   Danford, Earl Lites, MD  multivitamin (RENA-VIT) TABS tablet Take 1 tablet by mouth at bedtime. 02/23/23   Danford, Earl Lites, MD  nutrition supplement, JUVEN, (JUVEN) PACK Take 1 packet by mouth 2 (two) times daily between meals. 02/23/23   Danford, Earl Lites, MD  Nutritional Supplements (,FEEDING SUPPLEMENT, PROSOURCE PLUS) liquid Take 30 mLs by mouth 3 (three) times daily between meals. 02/23/23   Danford, Earl Lites, MD  ondansetron (ZOFRAN) 4 MG tablet Take 1 tablet (4 mg total) by mouth every 6 (six) hours as needed for nausea or vomiting. 03/17/23   Al Decant, PA-C  pentoxifylline (TRENTAL) 400 MG CR tablet Take 1 tablet (400 mg total) by mouth 3 (three) times daily with meals. 03/22/23   Adonis Huguenin, NP  psyllium (METAMUCIL) 58.6 % packet Take 1 packet by mouth 2 (two) times daily. Patient taking differently: Take 1 packet by mouth 2 (two) times daily. New medication from Dr. Burgess Estelle, have to pick up at Pharmacy. 01/27/23 04/27/23  Franky Macho, MD      Allergies    Promethazine, Promethazine hcl, and Other    Review of Systems   Review of Systems  Neurological:        Weakness    Physical Exam Updated Vital Signs BP (!) 147/79   Pulse 64   Temp 97.7 F (36.5 C) (Oral)   Resp 10   Ht 6\' 3"  (1.905 m)   Wt 79.4 kg   SpO2 100%   BMI 21.87 kg/m  Physical Exam Vitals and nursing note reviewed.  Constitutional:      General: He is not in acute distress.    Appearance: He is ill-appearing (chronically ill-appearing). He is not toxic-appearing.  HENT:     Head: Normocephalic and atraumatic.     Mouth/Throat:     Mouth: Mucous membranes are moist.  Eyes:     General: No scleral icterus.       Right eye: No discharge.        Left eye: No discharge.     Conjunctiva/sclera: Conjunctivae  normal.  Cardiovascular:     Rate and Rhythm: Normal rate and regular rhythm.     Pulses: Normal pulses.     Heart sounds: Normal heart sounds. No murmur heard. Pulmonary:     Effort: Pulmonary effort is normal. No respiratory distress.     Breath sounds: Normal breath sounds. No wheezing, rhonchi or rales.  Abdominal:     General: Abdomen is flat. Bowel sounds are normal. There is no distension.     Palpations: Abdomen is soft. There is no mass.     Tenderness: There is no abdominal tenderness.  Musculoskeletal:     Right lower leg: No edema.     Left lower leg: No edema.  Skin:    General: Skin  is warm and dry.     Findings: No rash.  Neurological:     General: No focal deficit present.     Mental Status: He is alert. Mental status is at baseline.     Comments: GCS 15. Speech is goal oriented. No deficits appreciated to CN III-XII; symmetric eyebrow raise, no facial drooping, tongue midline. Patient has equal grip strength bilaterally with 4/5 strength against resistance in all major muscle groups bilaterally. Sensation to light touch intact. Patient moves extremities without ataxia.    Psychiatric:        Mood and Affect: Mood normal.     ED Results / Procedures / Treatments   Labs (all labs ordered are listed, but only abnormal results are displayed) Labs Reviewed  CBC WITH DIFFERENTIAL/PLATELET - Abnormal; Notable for the following components:      Result Value   RBC 3.34 (*)    Hemoglobin 7.5 (*)    HCT 25.1 (*)    MCV 75.1 (*)    MCH 22.5 (*)    MCHC 29.9 (*)    RDW 21.1 (*)    All other components within normal limits  COMPREHENSIVE METABOLIC PANEL - Abnormal; Notable for the following components:   BUN 43 (*)    Creatinine, Ser 7.80 (*)    Calcium 8.8 (*)    Total Protein 6.4 (*)    Albumin 2.8 (*)    AST 13 (*)    GFR, Estimated 8 (*)    All other components within normal limits  CBG MONITORING, ED - Abnormal; Notable for the following components:    Glucose-Capillary 66 (*)    All other components within normal limits  CBG MONITORING, ED - Abnormal; Notable for the following components:   Glucose-Capillary 63 (*)    All other components within normal limits  RESP PANEL BY RT-PCR (RSV, FLU A&B, COVID)  [id number]  CBG MONITORING, ED    EKG EKG Interpretation Date/Time:  Thursday March 24 2023 09:20:24 EST Ventricular Rate:  62 PR Interval:  213 QRS Duration:  113 QT Interval:  455 QTC Calculation: 463 R Axis:   59  Text Interpretation: Sinus rhythm Ventricular premature complex Prolonged PR interval LVH with IVCD and secondary repol abnrm ectopy new from prior 1/25 Confirmed by Meridee Score [phone]) on 03/24/2023 9:21:34 AM  Radiology MR ANGIO HEAD WO CONTRAST Result Date: 03/24/2023 CLINICAL DATA:  Vision loss, monocular EXAM: MRA HEAD WITHOUT CONTRAST TECHNIQUE: Angiographic images of the Circle of Willis were acquired using MRA technique without intravenous contrast. COMPARISON:  03/17/2023 MRA FINDINGS: Anterior circulation: Both internal carotid arteries are patent to the termini, without significant stenosis. A1 segments patent. Normal anterior communicating artery. Possible focal stenosis in the left A3 (series 3, image 120) is favored to be artifactual given motion artifact in several vessels at that location; on the prior exam, there was no motion in this location and no stenosis. Anterior cerebral arteries are patent to their distal aspects without significant stenosis. No M1 stenosis or occlusion. Distal MCA branches perfused to their distal aspects without significant stenosis. Posterior circulation: Vertebral arteries patent to the vertebrobasilar junction without stenosis. Posterior inferior cerebral arteries patent bilaterally. Basilar patent to its distal aspect. Superior cerebellar arteries patent proximally. Patent P1 segments. PCAs perfused to their distal aspects without significant stenosis. The right posterior  communicating artery is patent. Anatomic variants: None significant IMPRESSION: No intracranial large vessel occlusion or significant stenosis. Electronically Signed   By: Elaina Pattee.D.  On: 03/24/2023 13:30    Procedures Procedures    Medications Ordered in ED Medications  meclizine (ANTIVERT) tablet 25 mg (25 mg Oral Given 03/24/23 1025)  acetaminophen (TYLENOL) tablet 1,000 mg (1,000 mg Oral Given 03/24/23 1025)  ondansetron (ZOFRAN-ODT) disintegrating tablet 4 mg (4 mg Oral Given 03/24/23 1421)  tetracaine (PONTOCAINE) 0.5 % ophthalmic solution 1 drop (1 drop Right Eye Given by Other 03/24/23 1853)  fluorescein ophthalmic strip 1 strip (1 strip Both Eyes Given 03/24/23 1855)    ED Course/ Medical Decision Making/ A&P                                 Medical Decision Making Amount and/or Complexity of Data Reviewed Labs: ordered. Radiology: ordered.  Risk OTC drugs. Prescription drug management.   This patient presents to the ED for concern of weakness, this involves an extensive number of treatment options, and is a complaint that carries with it a high risk of complications and morbidity.  The differential diagnosis includes Ischemic stroke, intracerebral hemorrhage, subarachnoid hemorrhage, Guillain-Barr syndrome, hypoglycemia, electrolyte abnormality, sepsis, ACS, carbon monoxide poisoning, anemia, dehydration.   Co morbidities that complicate the patient evaluation  right-sided BKA secondary to foot infection (01/2023), NSTEMI, CVA, left eye removal, paroxysmal A-fib not on anticoagulation, gastroparesis   Additional history obtained:  Patient with similar presentation to ED 7 days ago. Patient recently discharged from SNF.    Problem List / ED Course / Critical interventions / Medication management  Admitting patient for weakness, symptomatic anemia and hypoglycemia. Patient presents to ED concerned for headache, dizziness, vision loss. Patient stating that  these symptoms are baseline for him when he gets dialysis, but was lasting longer than usual today. These symptoms were relieved with tylenol and  meclizine. Patient stating that he is mostly concerned for not being able to take care of himself anymore. Stating that he has gastroparesis and has not been eating and his wife has to force him to eat when she gets home from her job. Patient also stating that he has been experiencing and increase in falls recently. Patient initially refusing to eat/drink in ED, but eventually was able to eat after Zofran. Patient also with hx of anemia of chronic disease and wondering if this is also contributing to symptoms. I Ordered, and personally interpreted labs.  BUN/creatinine elevated patient's baseline of 43/7.80.  CBC with anemia with hemoglobin at 7.5.  Respiratory panel negative.  CBG in the 60s initially which eventually increased to 80. The patient was maintained on a cardiac monitor.  I personally viewed and interpreted the EKG/cardiac monitored which showed an underlying rhythm of: sinus rhythm and PVC I ordered imaging studies including MRA head . I independently visualized and interpreted imaging which showed no acute process. I agree with the radiologist interpretation. PT able to see patient in ED and stating that patient would benefit from continued skinned physical therapy in hospital. Overall, I believe patient would benefit from a hospital admission/observation to help prevent hypoglycemia and have PT assist. Also consulting TOC to help assist with possible SNF placement. As for patient's blurry vision. Patient denies eye discharge, eye trauma or eye pain. States that he gets blurry vision with dialysis but the episode of blurry vision today is lasting longer than usual. Patient does have history of proliferative diabetic retinopathy, syneresis, and slightly elevated IOP which he takes Cosopt for. Tonopen attempted at least 10 times in ED and  is  malfunctioning. I attempted to find another Tonopen and Charge Nurse also helping me but there is not other Tonopen in the ED. I spoke with Optho on-call [name] and shared pertinent history and results. [name] stating that patient does not need to be transferred to [facility] for emergent eye workup. Patient's vision should instead be monitored and follow up with his outpatient retina specialist.  I have reviewed the patients home medicines and have made adjustments as needed Dr. Randol [name] admitting provider. I appreciate all of his help today.   Social Determinants of Health:  Recent SNF resident - states that he cannot take care of himself         Final Clinical Impression(s) / ED Diagnoses Final diagnoses:  Anemia of chronic disease  Physical deconditioning  Blurry vision, right eye    Rx / DC Orders ED Discharge Orders     None         Margarita Rana 03/24/23 1910    Terrilee Files, MD 03/25/23 1036

## 2023-03-24 NOTE — ED Notes (Signed)
CBG 80

## 2023-03-24 NOTE — ED Notes (Signed)
Transition of Care [facility]) - Emergency Department Mini Assessment   Patient Details  Name: [name]. MRN: [id number] Date of Birth: 1970/08/16  Transition of Care [facility]) CM/SW Contact:    Beather Arbour Phone Number: 03/24/2023, 4:07 PM   Clinical Narrative:  CSW was not consulted for placement. Did reach out to MD for consult to be placed to assess. CSW still reached out to pt wife regarding PT recommendation. Spouse shared that pt goes to [facility] dialysis in [place] Tuesday, Thursday, and Saturday's. Spouse stated that pt was at [facility] called Joetta Manners  in January of this year. Spouse states that she does everything for pt and request for a referral to be sent to [facility] [place] rehab. TOC will continue to follow.    ED Mini Assessment: What brought you to the Emergency Department? : Blurry vision  Barriers to Discharge:  (Awaiting for PT notes to send out with clincal documents)  Barrier interventions: Send pt referral to [facility] rehab , per wife request  Means of departure: Ambulance  Interventions which prevented an admission or readmission: SNF Placement    Patient Contact and Communications Key Contact 1: Harle Battiest   Spoke with: wife Contact Date: 03/24/23,   Contact time: 1606 Contact Phone Number: [phone] Call outcome: Designated SNF placement  Patient states their goals for this hospitalization and ongoing recovery are:: Dc to [facility] rehab if they will accept pt CMS Medicare.gov Compare Post Acute Care list provided to:: Patient Represenative (must comment) (Spouse- [name]) Choice offered to / list presented to : Spouse Martie Lee)  Admission diagnosis:  Blurred vision Patient Active Problem List   Diagnosis Date Noted   Diabetic hypoglycemia (HCC) 02/13/2023   S/P BKA (below knee amputation), right (HCC) - 02-09-2023 02/09/2023   Gangrene of right foot (HCC) 02/07/2023   H/O spontaneous intraventricular intracranial hemorrhage  10/2021 - while on plavix and Eliquis 02/26/2022   History of CVA (cerebrovascular accident) - with ICH while on plavix and Eliquis in 11-2021. 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) 08/19/2020   Anxiety state 08/19/2020   Callosity 08/19/2020   ED (erectile dysfunction) of organic origin 08/19/2020   Long term (current) use of insulin (HCC) 08/19/2020   Myalgia 08/19/2020   CAD/ H/o prior STEMI 02/2020 s/p DES to LAD 08/19/2020   Blindness of left eye 08/15/2020   Combined forms of age-related cataract of right eye 04/17/2020   Hypertension secondary to other renal disorders 07/20/2019   Moderate protein-calorie malnutrition (HCC) 05/14/2019   Chronic anemia 05/07/2019   Coagulation defect, unspecified (HCC) 05/07/2019   Disorder of phosphorus metabolism, unspecified 05/07/2019   Iron deficiency anemia, unspecified 05/07/2019   Secondary hyperparathyroidism of renal origin (HCC) 11/28/2018   Chronic Grade 2 diastolic heart failure/EF 55 to 60% 12/13/2017   Diabetes mellitus, type II (HCC) 11/23/2017   Diabetic retinopathy (HCC) 07/05/2017   Uncontrolled type 2 diabetes mellitus with  hyperglycemia, with long-term current use of insulin (HCC) 04/20/2016   Mixed hyperlipidemia    Essential hypertension, benign    PCP:  [name], FNP Pharmacy:   [facility] - [place], [place] - [address] [place] [address] Phone: [phone] Fax: [phone]

## 2023-03-24 NOTE — ED Notes (Signed)
Provider notified on CBG

## 2023-03-24 NOTE — ED Notes (Signed)
CT angio delayed. Pt has minimal access. Provider notified

## 2023-03-24 NOTE — Plan of Care (Signed)
  Problem: Acute Rehab PT Goals(only PT should resolve) Goal: Pt Will Go Supine/Side To Sit Outcome: Progressing Flowsheets (Taken 03/24/2023 1608) Pt will go Supine/Side to Sit:  with contact guard assist  with supervision Goal: Patient Will Transfer Sit To/From Stand Outcome: Progressing Flowsheets (Taken 03/24/2023 1608) Patient will transfer sit to/from stand: with moderate assist Goal: Pt Will Transfer Bed To Chair/Chair To Bed Outcome: Progressing Flowsheets (Taken 03/24/2023 1608) Pt will Transfer Bed to Chair/Chair to Bed: with mod assist Goal: Pt Will Ambulate Outcome: Progressing Flowsheets (Taken 03/24/2023 1608) Pt will Ambulate:  10 feet  with moderate assist  with rolling walker   4:09 PM, 03/24/23 Ocie Bob, MPT Physical Therapist with [facility] [phone] office [phone] mobile phone

## 2023-03-24 NOTE — ED Triage Notes (Addendum)
Pt arrived [facility] from Dialysis with c/o increased blurriness while he does dialysis and increased weakness since he got his amputation 2 weeks ago. Pt does have a cough and states it is hard for his wife to take care of him., Pt received all but 50 mins of dialysis today.

## 2023-03-24 NOTE — ED Notes (Signed)
Pt given Gatorade for low blood sugar.

## 2023-03-24 NOTE — Evaluation (Signed)
Physical Therapy Evaluation Patient Details Name: [name]. MRN: [id number] DOB: [birth year] Today's Date: 03/24/2023  History of Present Illness  Pt arrived [facility] from Dialysis with c/o increased blurriness while he does dialysis and increased weakness since he got his amputation 2 weeks ago. Pt does have a cough and states it is hard for his wife to take care of him., Pt received all but 50 mins of dialysis today.   Clinical Impression  Patient demonstrates slow labored movement for sitting up at bedside, limited to scooting to EOB and laterally scooting using mostly BUE due to LLE weakness and unable to attempt sit to stands or transferring to chair due to weakness, fatigue.  Patient will benefit from continued skilled physical therapy in hospital and recommended venue below to increase strength, balance, endurance for safe ADLs and gait.          If plan is discharge home, recommend the following: A lot of help with walking and/or transfers;A lot of help with bathing/dressing/bathroom;Assistance with cooking/housework;Help with stairs or ramp for entrance   Can travel by private vehicle   No    Equipment Recommendations None recommended by PT  Recommendations for Other Services       Functional Status Assessment Patient has had a recent decline in their functional status and demonstrates the ability to make significant improvements in function in a reasonable and predictable amount of time.     Precautions / Restrictions Precautions Precautions: Fall Restrictions Weight Bearing Restrictions Per Provider Order: No      Mobility  Bed Mobility Overal bed mobility: Needs Assistance Bed Mobility: Rolling, Sidelying to Sit, Sit to Sidelying Rolling: Supervision Sidelying to sit: Min assist     Sit to sidelying: Min assist General bed mobility comments: increased time, labored movement    Transfers                        Ambulation/Gait                   Stairs            Wheelchair Mobility     Tilt Bed    Modified Rankin (Stroke Patients Only)       Balance Overall balance assessment: Needs assistance Sitting-balance support: Feet unsupported, No upper extremity supported Sitting balance-Leahy Scale: Fair Sitting balance - Comments: seated at EOB                                     Pertinent Vitals/Pain Pain Assessment Pain Assessment: No/denies pain    Home Living Family/patient expects to be discharged to:: Private residence Living Arrangements: Spouse/significant other Available Help at Discharge: Family;Available PRN/intermittently Type of Home: House Home Access: Ramped entrance       Home Layout: One level Home Equipment: Rollator (4 wheels);Rolling Walker (2 wheels);BSC/3in1      Prior Function Prior Level of Function : Needs assist       Physical Assist : Mobility (physical)     Mobility Comments: was household ambulator using Rollator prior to R BKA ADLs Comments: assisted by family     Extremity/Trunk Assessment   Upper Extremity Assessment Upper Extremity Assessment: Generalized weakness    Lower Extremity Assessment Lower Extremity Assessment: Generalized weakness    Cervical / Trunk Assessment Cervical / Trunk Assessment: Normal  Communication   Communication Communication: No apparent difficulties Cueing Techniques:  Verbal cues;Tactile cues  Cognition Arousal: Alert Behavior During Therapy: WFL for tasks assessed/performed Overall Cognitive Status: Within Functional Limits for tasks assessed                                          General Comments      Exercises     Assessment/Plan    PT Assessment Patient needs continued PT services  PT Problem List Decreased strength;Decreased activity tolerance;Decreased balance;Decreased mobility       PT Treatment Interventions DME instruction;Gait training;Stair training;Functional  mobility training;Therapeutic activities;Therapeutic exercise;Balance training;Patient/family education    PT Goals (Current goals can be found in the Care Plan section)  Acute Rehab PT Goals Patient Stated Goal: return home after rehab PT Goal Formulation: With patient Time For Goal Achievement: 04/07/23 Potential to Achieve Goals: Good    Frequency Min 2X/week     Co-evaluation               AM-PAC PT "6 Clicks" Mobility  Outcome Measure Help needed turning from your back to your side while in a flat bed without using bedrails?: A Little Help needed moving from lying on your back to sitting on the side of a flat bed without using bedrails?: A Little Help needed moving to and from a bed to a chair (including a wheelchair)?: Total Help needed standing up from a chair using your arms (e.g., wheelchair or bedside chair)?: A Lot Help needed to walk in hospital room?: Total Help needed climbing 3-5 steps with a railing? : Total 6 Click Score: 11    End of Session   Activity Tolerance: Patient tolerated treatment well;Patient limited by fatigue Patient left: in bed;with call bell/phone within reach Nurse Communication: Mobility status PT Visit Diagnosis: Unsteadiness on feet (R26.81);Other abnormalities of gait and mobility (R26.89);Muscle weakness (generalized) (M62.81);Other symptoms and signs involving the nervous system (R29.898)    Time: 1610-9604 PT Time Calculation (min) (ACUTE ONLY): 14 min   Charges:   PT Evaluation $PT Eval Low Complexity: 1 Low PT Treatments $Therapeutic Activity: 8-22 mins PT General Charges $$ ACUTE PT VISIT: 1 Visit         4:04 PM, 03/24/23 Ocie Bob, MPT Physical Therapist with [facility] [phone] office [phone] mobile phone

## 2023-03-25 ENCOUNTER — Encounter (HOSPITAL_COMMUNITY): Payer: Self-pay | Admitting: Internal Medicine

## 2023-03-25 DIAGNOSIS — R531 Weakness: Principal | ICD-10-CM

## 2023-03-25 LAB — CBC
HCT: 29 % — ABNORMAL LOW (ref 39.0–52.0)
Hemoglobin: 8.4 g/dL — ABNORMAL LOW (ref 13.0–17.0)
MCH: 21.9 pg — ABNORMAL LOW (ref 26.0–34.0)
MCHC: 29 g/dL — ABNORMAL LOW (ref 30.0–36.0)
MCV: 75.5 fL — ABNORMAL LOW (ref 80.0–100.0)
Platelets: 183 10*3/uL (ref 150–400)
RBC: 3.84 MIL/uL — ABNORMAL LOW (ref 4.22–5.81)
RDW: 21.6 % — ABNORMAL HIGH (ref 11.5–15.5)
WBC: 5.4 10*3/uL (ref 4.0–10.5)
nRBC: 0 % (ref 0.0–0.2)

## 2023-03-25 LAB — C DIFFICILE QUICK SCREEN W PCR REFLEX
C Diff antigen: NEGATIVE
C Diff interpretation: NOT DETECTED
C Diff toxin: NEGATIVE

## 2023-03-25 LAB — BASIC METABOLIC PANEL
Anion gap: 13 (ref 5–15)
BUN: 48 mg/dL — ABNORMAL HIGH (ref 6–20)
CO2: 20 mmol/L — ABNORMAL LOW (ref 22–32)
Calcium: 9.1 mg/dL (ref 8.9–10.3)
Chloride: 108 mmol/L (ref 98–111)
Creatinine, Ser: 9.25 mg/dL — ABNORMAL HIGH (ref 0.61–1.24)
GFR, Estimated: 6 mL/min — ABNORMAL LOW (ref >60)
Glucose, Bld: 81 mg/dL (ref 70–99)
Potassium: 4.6 mmol/L (ref 3.5–5.1)
Sodium: 141 mmol/L (ref 135–145)

## 2023-03-25 LAB — HIV ANTIBODY (ROUTINE TESTING W REFLEX): HIV Screen 4th Generation wRfx: NONREACTIVE

## 2023-03-25 LAB — GLUCOSE, CAPILLARY: Glucose-Capillary: 91 mg/dL (ref 70–99)

## 2023-03-25 MED ORDER — PNEUMOCOCCAL 20-VAL CONJ VACC 0.5 ML IM SUSY: 0.5000 mL | PREFILLED_SYRINGE | INTRAMUSCULAR | Status: DC

## 2023-03-25 MED ORDER — DARBEPOETIN ALFA 150 MCG/0.3ML IJ SOSY
150.0000 ug | PREFILLED_SYRINGE | INTRAMUSCULAR | Status: DC
  Administered 2023-03-29 – 2023-04-06 (×2): 150 ug via SUBCUTANEOUS
  Filled 2023-03-25 (×3): qty 0.3

## 2023-03-25 MED ORDER — CHLORHEXIDINE GLUCONATE CLOTH 2 % EX PADS
6.0000 | MEDICATED_PAD | Freq: Every day | CUTANEOUS | Status: DC
  Administered 2023-03-26 – 2023-04-01 (×7): 6 via TOPICAL

## 2023-03-25 NOTE — Consult Note (Signed)
[facility] Renal Consultation Note  Requesting MD: Eric U[place], DO Indication for Consultation: ESRD  Chief complaint: dizziness and weakness  HPI:  [name]. is a 53 y.o. male with a history of ESRD on HD, DM, gastroparesis, recent right BKA, chronic heart failure with preserved EF, CAD, hx CVA, and paroxysmal afib not on anticoagulation who presented to the hospital with blurry vision.  He has also had generalized weakness and dizziness/lightheadedness during dialysis and states that his blood pressure is running low.  He last had HD on 1/30 outpatient and EMS was called to the HD unit.  He was brought to the ER and had MR angio head with no intracranial large vessel occlusion or significant stenosis.  Nephrology is consulted for assistance with management of HD.  Per team, he is pending SNF placement.  He had been home for 10 days after a 3-week stay at a SNF and found that he was unable to take care of himself.  PT was consulted and recommended SNF placement.  Note that with regard to dialysis he was previously at [facility] TTS then transitioned to [facility] per charting after a recent hospitalization.  Had been back at [facility] recently.  He states that he is "just too weak."  Spoke with outpatient HD unit for orders as below - he hasn't yet had labs there and they have been working to establish his EDW after an amputation and hospitalization.  He denies any shortness of breath.     PMHx:   Past Medical History:  Diagnosis Date   Acute Bil  CVAs of Bil Frontal amd Rt Parietal  in Watershed Distribution 04/21/2021   Patchy acute cortically-based infarcts within the bilateral high  frontal lobes and right parietal lobe (predominantly in a watershed  distribution).   Subcentimeter acute infarct within the callosal splenium on the  left.     Acute bilateral cerebral infarction in a watershed distribution [facility]) 04/21/2021   a.) MRI brain 04/21/2021 --> patchy ACUTE  cortically-based infarcts within the bilateral high frontal lobes and right parietal lobe   Acute cerebral infarction (HCC) 02/13/2021   a.) MRI brain 02/13/2021: acute LEFT hippocampal infarct   Acute cerebral infarction (HCC) 04/21/2021   a.) MRI brain 04/21/2021: subcentimeter ACUTE infarct within the callosal splenium on the LEFT   Acute osteomyelitis of toe of right foot with gangrene (HCC) 02/26/2022   Anaphylactic reaction due to adverse effect of correct drug or medicament properly administered, initial encounter 05/08/2019   Anemia of chronic renal failure    Anxiety    Aortic dilatation (HCC) 11/25/2020   a.) TTE 11/25/2020: Ao root measured 38 mm. b.) TTE 04/22/2021: Ao root measured 44 mm; c. 11/2021 Echo: Ao root 40mm.   Atrial fibrillation (HCC)    a.) CHA2DS2-VASc = 6 (CHF, HTN, CVA x2, prior MI, T2DM). b.) rate/rhythm maintained on oral amiodarone + carvedilol; chronic OAC/AP therapy discontinued following ICH   Benign prostatic hyperplasia with lower urinary tract symptoms 11/07/2019   BPH (benign prostatic hyperplasia)    Cardiac arrest (HCC) 07/26/2021   a.) in setting of hyperkalemia, NSTEMI, and seizure following missed HD; pulseless and apneic --> required 1 round of CPR prior to ROSC   Cellulitis    Cerebral hemorrhage (HCC) 11/21/2021   a.) CT head 11/21/2021 --> 1.4 x 1.5 x 3.1 cm ACUTE hemorrhage within the inferior aspect of the fourth ventricle, and extending inferiorly through the foramen of foramen of [place] --> occurred in setting of  HTN emergency and DOAC/APT-->eliquis/plavix d/c'd.   Cerebral microvascular disease    Chronic diarrhea    Chronic heart failure with preserved ejection fraction (HFpEF) (HCC) 07/19/2017   a.) 06/2017 Echo: EF 75%; b.) 11/2020 Echo: EF 55-60%; c.) 04/2021 Echo: EF 55-60%; d.) 11/2021 Echo: EF 65-70%, GrII DD, nl RV fxn, sev dil LA, large circumferential pericardial eff w/o tamponade, triv MR, AoV sclerosis. Ao root 40mm; e.) TTE  02/28/2025: EF 60-65%, sev LVH, mod posterior LV effusion, AoV sclerosis   Chronic pain of both ankles    Coronary artery disease 02/26/2020   a. 02/2020 Ant STEMI/PCI: LM nl, LAD 3m (3.5x30 Resolute Onyx), RI nl, LCX min irregs, RCA min irregs. EF 65%.   DDD (degenerative disc disease), cervical    Diabetes mellitus, type II (HCC)    Diabetic neuropathy (HCC)    Esophagitis    ESRD (end stage renal disease) on dialysis (HCC)    a.) [facility]; T-Th-Sat   Frequent falls    Gait instability    Gastritis    Gastroparesis    GERD (gastroesophageal reflux disease)    H/O enucleation of left eyeball    Heart palpitations    History of kidney stones    HLD (hyperlipidemia)    Hypertension    Insomnia    a.) uses melatonin PRN   Long term current use of amiodarone    Nausea and vomiting in adult    recurrent   NSTEMI (non-ST elevated myocardial infarction) (HCC) 07/26/2021   a.) hyperkalemic from missed HD; seizures; decompensated to cardiac arrest and required CPR x 1 round prior to ROSC.   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  Added automatically from request for surgery [id number]     Osteomyelitis [facility])    Pericardial effusion    a. 11/2021 Echo: EF 65-70%, GrII DD, nl RV fxn, sev dil LA, large circumferential pericardial eff w/o tamponade, triv MR, AoV sclerosis. Ao root 40mm.   Seizure (HCC)    a.) last 07/26/2021 in setting of missed HD --> hyperkalemic at 6.5 --> pulseless/apneic and required CPR; discharged home on levetiracetam.   ST elevation myocardial infarction (STEMI) of anterior wall (HCC) 02/26/2020   a.) LHC/PCI 02/26/2020 --> EF 65%; LVEDP 11 mmHg; 90% mLAD (3.5 x 20 mm Resolute Onyx DES x 1)    Past Surgical History:  Procedure Laterality Date   A/V FISTULAGRAM Left 08/31/2021   Procedure: A/V Fistulagram;  Surgeon: Annice Needy, MD;  Location: [name] INVASIVE CV LAB;  Service:  Cardiovascular;  Laterality: Left;   A/V FISTULAGRAM Left 01/25/2022   Procedure: A/V Fistulagram;  Surgeon: Annice Needy, MD;  Location: [name] INVASIVE CV LAB;  Service: Cardiovascular;  Laterality: Left;   A/V FISTULAGRAM Right 10/18/2022   Procedure: A/V Fistulagram;  Surgeon: Annice Needy, MD;  Location: [name] INVASIVE CV LAB;  Service: Cardiovascular;  Laterality: Right;   ABDOMINAL AORTOGRAM W/LOWER EXTREMITY Bilateral 02/08/2023   Procedure: ABDOMINAL AORTOGRAM W/LOWER EXTREMITY;  Surgeon: Daria Pastures, MD;  Location: [facility] INVASIVE CV LAB;  Service: Vascular;  Laterality: Bilateral;   AMPUTATION Left 03/16/2016   Procedure: AMPUTATION DIGIT LEFT HALLUX;  Surgeon: Vivi Barrack, DPM;  Location: [facility] OR;  Service: Podiatry;  Laterality: Left;  can start around 5    AMPUTATION Right 02/09/2021   Procedure: AMPUTATION DIGIT;  Surgeon: Marlyne Beards, MD;  Location: [facility] OR;  Service: Orthopedics;  Laterality: Right;  AMPUTATION Right 02/04/2023   Procedure: AMPUTATION 4TH AND 5TH METATARSAL;  Surgeon: Pilar Plate, DPM;  Location: [facility] OR;  Service: Orthopedics/Podiatry;  Laterality: Right;  I&D, partial ray resection, abx beads   AMPUTATION Right 02/09/2023   Procedure: RIGHT BELOW KNEE AMPUTATION;  Surgeon: [name], MD;  Location: [facility] OR;  Service: Orthopedics;  Laterality: Right;   AMPUTATION TOE Right 02/28/2022   Procedure: AMPUTATION TOE;  Surgeon: Louann Sjogren, DPM;  Location: [name] ORS;  Service: Podiatry;  Laterality: Right;  right fifth toe   ARTHROSCOPIC REPAIR ACL Left    AV FISTULA PLACEMENT Right 05/31/2019   Procedure: Brachiocephalic AV fistula creation;  Surgeon: Annice Needy, MD;  Location: [name] ORS;  Service: Vascular;  Laterality: Right;   AV FISTULA PLACEMENT Left 08/13/2021   Procedure: ARTERIOVENOUS (AV) FISTULA CREATION (BRACHIALCEPHALIC);  Surgeon: Annice Needy, MD;  Location: [name] ORS;  Service: Vascular;  Laterality: Left;   COLONOSCOPY WITH  PROPOFOL N/A 10/28/2015   Procedure: COLONOSCOPY WITH PROPOFOL;  Surgeon: Christena Deem, MD;  Location: [facility] ENDOSCOPY;  Service: Endoscopy;  Laterality: N/A;   COLONOSCOPY WITH PROPOFOL N/A 10/29/2015   Procedure: COLONOSCOPY WITH PROPOFOL;  Surgeon: Christena Deem, MD;  Location: [facility] ENDOSCOPY;  Service: Endoscopy;  Laterality: N/A;   COLONOSCOPY WITH PROPOFOL N/A 01/27/2023   Procedure: COLONOSCOPY WITH PROPOFOL;  Surgeon: Franky Macho, MD;  Location: [facility]O [facility];  Service: Endoscopy;  Laterality: N/A;  12:30pm, asa 3   CORONARY ULTRASOUND/IVUS N/A 02/26/2020   Procedure: Intravascular Ultrasound/IVUS;  Surgeon: Corky Crafts, MD;  Location[facility] INVASIVE CV LAB;  Service: Cardiovascular;  Laterality: N/A;   CORONARY/GRAFT ACUTE MI REVASCULARIZATION N/A 02/26/2020   Procedure: Coronary/Graft Acute MI Revascularization;  Surgeon: Corky Crafts, MD;  Location: [facility] INVASIVE CV LAB;  Service: Cardiovascular;  Laterality: N/A;   DIALYSIS/PERMA CATHETER INSERTION Right 04/26/2019   Perm Cath    DIALYSIS/PERMA CATHETER INSERTION N/A 04/26/2019   Procedure: DIALYSIS/PERMA CATHETER INSERTION;  Surgeon: Annice Needy, MD;  Location: [name] INVASIVE CV LAB;  Service: Cardiovascular;  Laterality: N/A;   DIALYSIS/PERMA CATHETER INSERTION N/A 05/25/2021   Procedure: DIALYSIS/PERMA CATHETER INSERTION;  Surgeon: Annice Needy, MD;  Location: [name] INVASIVE CV LAB;  Service: Cardiovascular;  Laterality: N/A;   DIALYSIS/PERMA CATHETER INSERTION N/A 08/31/2021   Procedure: DIALYSIS/PERMA CATHETER INSERTION;  Surgeon: Annice Needy, MD;  Location: [name] INVASIVE CV LAB;  Service: Cardiovascular;  Laterality: N/A;   DIALYSIS/PERMA CATHETER REMOVAL N/A 08/15/2019   Procedure: DIALYSIS/PERMA CATHETER REMOVAL;  Surgeon: Renford Dills, MD;  Location: [name] INVASIVE CV LAB;  Service: Cardiovascular;  Laterality: N/A;   DIALYSIS/PERMA CATHETER REMOVAL N/A 03/15/2022   Procedure: DIALYSIS/PERMA  CATHETER REMOVAL;  Surgeon: Annice Needy, MD;  Location: [name] INVASIVE CV LAB;  Service: Cardiovascular;  Laterality: N/A;   EMBOLIZATION (CATH LAB) Right 02/06/2021   Procedure: EMBOLIZATION;  Surgeon: Annice Needy, MD;  Location: [name] INVASIVE CV LAB;  Service: Cardiovascular;  Laterality: Right;  Right Upper Extremity Dialysis Access, Permcath Placement   ESOPHAGOGASTRODUODENOSCOPY (EGD) WITH PROPOFOL N/A 12/27/2017   Procedure: ESOPHAGOGASTRODUODENOSCOPY (EGD) WITH PROPOFOL;  Surgeon: Toledo, Boykin Nearing, MD;  Location: [name] ENDOSCOPY;  Service: Gastroenterology;  Laterality: N/A;   EYE SURGERY     LEFT HEART CATH AND CORONARY ANGIOGRAPHY N/A 02/26/2020   Procedure: LEFT HEART CATH AND CORONARY ANGIOGRAPHY;  Surgeon: Corky Crafts, MD;  Location: [facility] INVASIVE CV LAB;  Service: Cardiovascular;  Laterality: N/A;   PROSTATE SURGERY  2016  REVISON OF ARTERIOVENOUS FISTULA Right 07/22/2022   Procedure: RESECTION OF ANEURYSMAL RIGHT ARM ARTERIOVENOUS FISTULA;  Surgeon: Annice Needy, MD;  Location: [name] ORS;  Service: Vascular;  Laterality: Right;   TONSILECTOMY/ADENOIDECTOMY WITH MYRINGOTOMY     TONSILLECTOMY     UPPER EXTREMITY ANGIOGRAPHY Right 11/26/2020   Procedure: Upper Extremity Angiography;  Surgeon: Annice Needy, MD;  Location: [name] INVASIVE CV LAB;  Service: Cardiovascular;  Laterality: Right;   UPPER EXTREMITY ANGIOGRAPHY Right 02/05/2021   Procedure: UPPER EXTREMITY ANGIOGRAPHY;  Surgeon: Annice Needy, MD;  Location: [name] INVASIVE CV LAB;  Service: Cardiovascular;  Laterality: Right;    Family Hx:  Family History  Problem Relation Age of Onset   CAD Father    Stroke Father    Diabetes Mellitus II Mother    Kidney failure Mother    Schizophrenia Mother     Social History:  reports that he has never smoked. He has never used smokeless tobacco. He reports that he does not drink alcohol and does not use drugs.  Allergies:  Allergies  Allergen Reactions   Promethazine  Diarrhea and Other (See Comments)    Muscle cramps, cramping "all over"    Medications: Prior to Admission medications   Medication Sig Start Date End Date Taking? Authorizing Provider  acetaminophen (TYLENOL) 325 MG tablet Take 1-2 tablets (325-650 mg total) by mouth every 4 (four) hours as needed for mild pain. 05/04/21  Yes Love, Evlyn Kanner, PA-C  amiodarone (PACERONE) 200 MG tablet Take 1 tablet (200 mg total) by mouth every morning. 08/02/22 08/03/23 Yes Corky Crafts, MD  atorvastatin (LIPITOR) 80 MG tablet TAKE ONE TABLET BY MOUTH AT BEDTIME. 03/22/23  Yes Gaston Islam., NP  calcitRIOL (ROCALTROL) 0.25 MCG capsule Take 9 capsules (2.25 mcg total) by mouth every Tuesday, Thursday, and Saturday at 6 PM. 02/24/23  Yes Danford, Earl Lites, MD  calcium acetate (PHOSLO) 667 MG capsule Take 2 capsules (1,334 mg total) by mouth 3 (three) times daily with meals. 05/04/21  Yes Love, Evlyn Kanner, PA-C  carvedilol (COREG) 12.5 MG tablet Take 1 tablet (12.5 mg total) by mouth 2 (two) times daily. 02/23/23  Yes Danford, Earl Lites, MD  cinacalcet (SENSIPAR) 30 MG tablet Take 2 tablets (60 mg total) by mouth every Tuesday, Thursday, and Saturday at 6 PM. 02/24/23  Yes Danford, Earl Lites, MD  clindamycin (CLEOCIN) 300 MG capsule Take 300 mg by mouth 3 (three) times daily. 03/21/23 03/28/23 Yes [provider]  dorzolamide-timolol (COSOPT) 2-0.5 % ophthalmic solution Place 1 drop into the right eye 2 (two) times daily. 11/15/22 11/15/23 Yes [provider]  hydrocortisone (ANUSOL-HC) 2.5 % rectal cream Place 1 Application rectally 2 (two) times daily. 10/05/22  Yes Leath-Warren, Sadie Haber, NP  levETIRAcetam (KEPPRA) 500 MG tablet Take 1 tablet (500 mg total) by mouth 4 (four) times a week. On Sunday, Monday, Wednesday and Friday, all non-dialysis days 02/25/23  Yes Danford, Earl Lites, MD  levETIRAcetam (KEPPRA) 750 MG tablet Take 1 tablet (750 mg total) by mouth 3 (three) times a week.  On Tuesday Thursday and Saturday, on dialysis days 02/24/23  Yes Danford, Earl Lites, MD  lidocaine-prilocaine (EMLA) cream Apply 1 Application topically 3 (three) times a week. 12/24/21  Yes [provider]  loperamide (IMODIUM) 2 MG capsule Take 1 capsule (2 mg total) by mouth as needed for diarrhea or loose stools. 02/23/23  Yes Danford, Earl Lites, MD  midodrine (PROAMATINE) 5 MG tablet Take 1 tablet (  5 mg total) by mouth 3 (three) times daily with meals. 02/23/23  Yes Danford, Earl Lites, MD  nutrition supplement, JUVEN, (JUVEN) PACK Take 1 packet by mouth 2 (two) times daily between meals. 02/23/23  Yes Danford, Earl Lites, MD  Nutritional Supplements (,FEEDING SUPPLEMENT, PROSOURCE PLUS) liquid Take 30 mLs by mouth 3 (three) times daily between meals. 02/23/23  Yes Danford, Earl Lites, MD  ondansetron (ZOFRAN) 4 MG tablet Take 1 tablet (4 mg total) by mouth every 6 (six) hours as needed for nausea or vomiting. 03/17/23  Yes Al Decant, PA-C    I have reviewed the patient's current medications.  Labs:     Latest Ref Rng & Units 03/25/2023    3:43 AM 03/24/2023   10:34 AM 03/17/2023   10:25 AM  BMP  Glucose 70 - 99 mg/dL 81  76  85   BUN 6 - 20 mg/dL 48  43  54   Creatinine 0.61 - 1.24 mg/dL 6.57  8.46  9.62   Sodium 135 - 145 mmol/L 141  139  140   Potassium 3.5 - 5.1 mmol/L 4.6  4.0  3.0   Chloride 98 - 111 mmol/L 108  104  102   CO2 22 - 32 mmol/L 20  23  24    Calcium 8.9 - 10.3 mg/dL 9.1  8.8  8.6      ROS:  Pertinent items noted in HPI and remainder of comprehensive ROS otherwise negative.  Physical Exam: Vitals:   03/25/23 0355 03/25/23 0748  BP: (!) 143/76 (!) 162/70  Pulse: 70 64  Resp: 16 18  Temp: 98.9 F (37.2 C) 98.9 F (37.2 C)  SpO2: 95% 97%     General: adult male in stretcher in NAD  HEENT: NCAT Eyes: EOMI sclera anicteric Neck: supple trachea midline  Heart: S1S2 no rub Lungs: clear and unlabored; on room air Abdomen:  soft/nt/nd Extremities: right BKA; no edema appreciated either extremity Skin: no rash on extremities exposed Neuro: alert and conversant; provides hx and follows commands Psych normal mood and affect  Access LUE AVF with bruit and thrill   Outpatient HD orders:  [facility]  TTS  3 hrs and 15 minutes EDW 72 kg but being assessed after amputation and hospitalization  BF 400  DF 800  LUE AVF  2K/2Ca Last post weight was 71.7 kg on on 1/30 Medications - they have not gotten labs yet at [facility] so he hasn't gotten ESA restarted or any other scheduled in-center meds.  He came back to [facility] on 03/17/23 Heparin 400 units/hr and a loading dose of 1000 units   Assessment/Plan:  # ESRD  - HD per TTS schedule  - He has most recently been at [facility] but this may need to change depending on SNF placement   # Atrial fibrillation  - agents per primary team - he is on amio and coreg - note that midodrine is ordered for support to allow these agents as well   # Anemia of CKD - No ESA given yet at [facility] per outpatient RN - she states that he has been there since 03/17/23  - ESA can be given at least 03/29/23 - will order in the event that he is inpatient.  (Aranesp 150 mcg every Tuesday is ordered)  # Metabolic bone disease - on sensipar and calcitriol per prior regimen currently - calcium acceptable   - renal panel in AM  # Weakness - Patient/family unable to care for him  at home and SNF has been recommended   Disposition per primary team.  Per charting awaiting SNF.  Note that once his SNF is confirmed will need to ensure that he has a HD unit nearby.  Nephrology will review his chart and labs over the weekend and he will next been seen on Monday.  Please do not hesitate to reach out to the on call provider over the weekend with questions   Estanislado Emms 03/25/2023, 11:36 AM

## 2023-03-25 NOTE — Plan of Care (Signed)
  Problem: Acute Rehab OT Goals (only OT should resolve) Goal: Pt. Will Perform Grooming Flowsheets (Taken 03/25/2023 1058) Pt Will Perform Grooming:  with modified independence  sitting Goal: Pt. Will Perform Upper Body Dressing Flowsheets (Taken 03/25/2023 1058) Pt Will Perform Upper Body Dressing:  with modified independence  sitting Goal: Pt. Will Perform Lower Body Dressing Flowsheets (Taken 03/25/2023 1058) Pt Will Perform Lower Body Dressing:  with contact guard assist  sitting/lateral leans Goal: Pt. Will Transfer To Toilet Flowsheets (Taken 03/25/2023 1058) Pt Will Transfer to Toilet:  with contact guard assist  with min assist  stand pivot transfer Goal: Pt. Will Perform Toileting-Clothing Manipulation Flowsheets (Taken 03/25/2023 1058) Pt Will Perform Toileting - Clothing Manipulation and hygiene:  with contact guard assist  with supervision  sitting/lateral leans Goal: Pt/Caregiver Will Perform Home Exercise Program Flowsheets (Taken 03/25/2023 1058) Pt/caregiver will Perform Home Exercise Program:  Increased ROM  Increased strength  Both right and left upper extremity  Independently  Misaki Sozio OT, MOT

## 2023-03-25 NOTE — Evaluation (Signed)
Occupational Therapy Evaluation Patient Details Name: [name]. MRN: [id number] DOB: 07/15/1970 Today's Date: 03/25/2023   History of Present Illness Pt arrived [facility] from Dialysis with c/o increased blurriness while he does dialysis and increased weakness since he got his amputation 2 weeks ago. Pt does have a cough and states it is hard for his wife to take care of him., Pt received all but 50 mins of dialysis today.   Clinical Impression   Pt agreeable to seated OT evaluation. Pt reported he could not stand today. Pt is assisted for bathing and dressing at baseline. Pt demonstrates weak B UE with limited active and passive range of motion. Pt required CGA for bed mobility with HOB raised substantially. Pt declined to attempt standing today. Reports that he wishes to get stronger. Pt likely required min A for upper body dressing and mod to max for lower body ADL tasks. Pt left in the bed in the ED hall. Pt will benefit from continued OT in the hospital and recommended venue below to increase strength, balance, and endurance for safe ADL's.         If plan is discharge home, recommend the following: A lot of help with walking and/or transfers;A lot of help with bathing/dressing/bathroom;Assistance with cooking/housework;Assist for transportation;Help with stairs or ramp for entrance    Functional Status Assessment  Patient has had a recent decline in their functional status and demonstrates the ability to make significant improvements in function in a reasonable and predictable amount of time.  Equipment Recommendations  None recommended by OT           Precautions / Restrictions Precautions Precautions: Fall Restrictions Weight Bearing Restrictions Per Provider Order: No      Mobility Bed Mobility Overal bed mobility: Needs Assistance Bed Mobility: Supine to Sit, Sit to Supine     Supine to sit: Supervision, Contact guard, HOB elevated Sit to supine: Supervision,  Contact guard assist, HOB elevated   General bed mobility comments: labored effort; mild extended time    Transfers                   General transfer comment: Pt declined to attempt today.      Balance Overall balance assessment: Needs assistance Sitting-balance support: No upper extremity supported, Feet supported Sitting balance-Leahy Scale: Fair Sitting balance - Comments: fair to good seated at EOB                                   ADL either performed or assessed with clinical judgement   ADL Overall ADL's : Needs assistance/impaired Eating/Feeding: Set up;Sitting   Grooming: Set up;Minimal assistance;Sitting   Upper Body Bathing: Minimal assistance;Sitting   Lower Body Bathing: Maximal assistance;Sitting/lateral leans   Upper Body Dressing : Minimal assistance;Sitting   Lower Body Dressing: Maximal assistance;Sitting/lateral leans   Toilet Transfer:  (not tested)   Toileting- Clothing Manipulation and Hygiene: Moderate assistance;Sitting/lateral lean               Vision Baseline Vision/History: 2 Legally blind Ability to See in Adequate Light: 2 Moderately impaired Patient Visual Report: Blurring of vision Vision Assessment?: Vision impaired- to be further tested in functional context (Pt is missing l eye. Reports continued blurry vision in R eye.)     Perception Perception: Not tested       Praxis Praxis: Not tested       Pertinent Vitals/Pain  Pain Assessment Pain Assessment: Faces Faces Pain Scale: No hurt     Extremity/Trunk Assessment Upper Extremity Assessment Upper Extremity Assessment: RUE deficits/detail;LUE deficits/detail RUE Deficits / Details: Limited to 2+/5 A/ROm seated puright in bed. 75% P/ROM shoulder flexion. Generally weak otherwise. LUE Deficits / Details: Limited to 2+/5 A/ROm seated puright in bed. near 75% P/ROM shoulder flexion. Generally weak otherwise.   Lower Extremity Assessment Lower  Extremity Assessment: Defer to PT evaluation       Communication Communication Communication: No apparent difficulties   Cognition Arousal: Alert Behavior During Therapy: WFL for tasks assessed/performed Overall Cognitive Status: Within Functional Limits for tasks assessed                                                        Home Living Family/patient expects to be discharged to:: Private residence Living Arrangements: Spouse/significant other Available Help at Discharge: Family;Available PRN/intermittently Type of Home: House Home Access: Ramped entrance     Home Layout: One level     Bathroom Shower/Tub: Walk-in shower;Sponge bathes at baseline   Bathroom Toilet: Standard Bathroom Accessibility: No   Home Equipment: Rollator (4 wheels);Rolling Walker (2 wheels);BSC/3in1   Additional Comments: per chart      Prior Functioning/Environment Prior Level of Function : Needs assist       Physical Assist : Mobility (physical);ADLs (physical)   ADLs (physical): IADLs;Bathing;Dressing Mobility Comments: 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 Reports using bed pan at baseline. Reports ability to feed himself.        OT Problem List: Decreased strength;Decreased range of motion;Decreased activity tolerance;Impaired balance (sitting and/or standing);Impaired UE functional use      OT Treatment/Interventions: Self-care/ADL training;Therapeutic exercise;DME and/or AE instruction;Therapeutic activities;Patient/family education    OT Goals(Current goals can be found in the care plan section) Acute Rehab OT Goals Patient Stated Goal: get stronger OT Goal Formulation: With patient Time For Goal Achievement: 04/08/23 Potential to Achieve Goals: Fair  OT Frequency: Min 2X/week                  AM-PAC OT "6 Clicks" Daily Activity     Outcome Measure Help from  another person eating meals?: A Little Help from another person taking care of personal grooming?: A Little Help from another person toileting, which includes using toliet, bedpan, or urinal?: A Lot Help from another person bathing (including washing, rinsing, drying)?: A Lot Help from another person to put on and taking off regular upper body clothing?: A Little Help from another person to put on and taking off regular lower body clothing?: A Lot 6 Click Score: 15   End of Session Nurse Communication:  (Left in ED hall bed whre pt was at the start of the session.)  Activity Tolerance: Patient tolerated treatment well Patient left: in bed  OT Visit Diagnosis: Unsteadiness on feet (R26.81);Other abnormalities of gait and mobility (R26.89);Muscle weakness (generalized) (M62.81)                Time: 1610-9604 OT Time Calculation (min): 6 min Charges:  OT General Charges $OT Visit: 1 Visit OT Evaluation $OT Eval Low Complexity: 1 Low  Evann Koelzer OT, MOT  Danie Chandler 03/25/2023, 10:56 AM

## 2023-03-25 NOTE — ED Notes (Signed)
Pt taken of bedpan, unable to have BM. Pt set up with breakfast tray.

## 2023-03-25 NOTE — Progress Notes (Signed)
PROGRESS NOTE    Teron Glori Luis.  JYN:[id number] DOB: 05/11/1970 DOA: 03/24/2023 PCP: [name], FNP    Brief Narrative:   Takahiro Godinho. is a 53 y.o. male with past medical history significant for ESRD on HD TTS, recent right BKA ([name]), CAD, CVA, paroxysmal atrial fibrillation not on anticoagulation due to history of ICH, gastroparesis, type 2 diabetes mellitus, diabetic retinopathy, glaucoma, history of left eye enucleation who presented to [name], ED on 1/30 via EMS from dialysis unit with complaints of increased blurriness, weakness, cough and difficulty managing at home.  Patient states symptoms worse with dialysis.  Recently discharged from the hospital to SNF for 3 weeks and has been home over the last 10 days.  Patient reports is unable to take care of himself due to weakness and spouse working all the time.  Requesting placement.  In the ED, temperature 97.7 F, HR 64, RR 10, BP 147/79, SpO2 100% on room air.  WBC 5.3, hemoglobin 7.5, platelet count 167.  Sodium 139, potassium 4.0, chloride 104, CO2 23, glucose 76, BUN 43, creatinine 7.80.  AST 13, ALT 14, total bilirubin 0.7.  COVID/influenza/RSV PCR negative.  MR angiogram head without contrast with no intracranial large vessel occlusion or significant stenosis.  [name] discussed with ophthalmology on-call, [name] regarding his blurred vision in the setting of known diabetic retinopathy, glaucoma and history of left eye enucleation; ophthalmology recommended no other workup at this time and vision to be monitored and outpatient follow-up with his retina specialist.  [name] consulted for admission for further evaluation management of progressive weakness with TOC involvement for placement.  Assessment & Plan:   Generalized weakness/deconditioning/gait disturbance Patient presenting to the ED from his HD unit with dizziness, progressive weakness and difficulty managing at home.  Recent prolonged hospitalization followed  by subacute rehab after right BKA by Dr [name] on 02/09/2023. -- Continue PT/OT efforts while inpatient --[facility] consulted for SNF placement  History of gangrene right foot s/p BKA Patient with recent BKA February 09, 2023 by [name].  Recently seen in outpatient office visit 03/22/2023. --Continue Daily Dial soap cleansing of all wounds, dry dressings.  Allevyn foam dressing to the left heel with dry dressing and shrinker to the right residual limb  -- Outpatient follow-up with orthopedics  ESRD on HD TTS Currently dialyzes at [facility]. -- Nephrology consulted for continued HD while inpatient  Paroxysmal atrial fibrillation -- Amiodarone 2 mg p.o. daily -- Carvedilol 12.5 mg p.o. twice daily -- Not on anticoagulation due to history of ICH  Type 2 diabetes mellitus Hemoglobin A1c 4.5% on 11/21/2021.  Diet controlled at baseline. -- Glucose 81 on BMP this a.m., continue intermittent monitoring of glucose  Diabetic retinopathy, glaucoma and history of left eye enucleation Patient complaining of dizziness, known diabetic retinopathy, left eye enucleation follows with retina specialist outpatient.  [name] discussed with ophthalmology on-call, [name] regarding his blurred vision with no further recommendations other than outpatient follow-up with his retina specialist.  MRA brain with no large vessel occlusion or significant stenosis. -- Outpatient follow-up with ophthalmology  Seizure disorder -- Keppra 500 mg daily on Sunday/Tuesday/Thursday/Saturday -- Keppra 750 mg p.o. daily on Monday/Wednesday/Friday -- Outpatient follow-up with neurology  Anemia of chronic medical/renal disease -- Hemoglobin 8.4 -- Repeat CBC in the a.m.  Autonomic dysfunction -- Midodrine 5 mg p.o. 3 times daily   DVT prophylaxis: SCDs Start: 03/24/23 2107    Code Status: Full Code Family Communication: No family  present at bedside this morning  Disposition Plan:  Level of care:  Med-Surg Status is: Observation The patient remains OBS appropriate and will d/c before 2 midnights.    Consultants:  Nephrology  Procedures:  None  Antimicrobials:  None   Subjective: Patient seen examined bedside, resting calmly.  Lying in bed.  Remains in ED holding area.  Continues to endorse generalized weakness.  Awaiting SNF placement.  No other specific questions, concerns or complaints at this time.  Denies headache, no chest pain, no palpitations, no shortness of breath, no abdominal pain, no fever/chills/night sweats, no nausea/vomiting/diarrhea, no focal weakness, no fatigue, no paresthesia.  No acute events overnight per nursing.  Objective: Vitals:   03/24/23 0930 03/25/23 0355 03/25/23 0748 03/25/23 1116  BP:  (!) 143/76 (!) 162/70 139/77  Pulse: 64 70 64 69  Resp: 10 16 18 16   Temp:  98.9 F (37.2 C) 98.9 F (37.2 C) 98.2 F (36.8 C)  TempSrc:  Oral Oral Oral  SpO2: 100% 95% 97% 96%  Weight:      Height:       No intake or output data in the 24 hours ending 03/25/23 1128 Filed Weights   03/24/23 0925  Weight: 79.4 kg    Examination:  Physical Exam: GEN: NAD, alert and oriented x 3, chronically ill in appearance, appears on stated age HEENT: NCAT, noted enucleated left eye, MMM PULM: CTAB w/o wheezes/crackles, normal respiratory effort, on room air CV: RRR w/o M/G/R GI: abd soft, NTND, NABS, no R/G/M MSK: Right BKA site noted with Kerlix dressing in place, clean/dry/intact NEURO: CN II-XII intact, no focal deficits, sensation to light touch intact PSYCH: normal mood/affect Integumentary: Right BKA site as above, otherwise no other concerning rashes/lesions/wounds noted on exposed skin surfaces.    Data Reviewed: I have personally reviewed following labs and imaging studies  CBC: Recent Labs  Lab 03/24/23 1034 03/25/23 0343  WBC 5.3 5.4  NEUTROABS 2.7  --   HGB 7.5* 8.4*  HCT 25.1* 29.0*  MCV 75.1* 75.5*  PLT 167 183   Basic Metabolic  Panel: Recent Labs  Lab 03/24/23 1034 03/25/23 0343  NA 139 141  K 4.0 4.6  CL 104 108  CO2 23 20*  GLUCOSE 76 81  BUN 43* 48*  CREATININE 7.80* 9.25*  CALCIUM 8.8* 9.1   GFR: Estimated Creatinine Clearance: 10.1 mL/min (A) (by C-G formula based on SCr of 9.25 mg/dL (H)). Liver Function Tests: Recent Labs  Lab 03/24/23 1034  AST 13*  ALT 14  ALKPHOS 62  BILITOT 0.7  PROT 6.4*  ALBUMIN 2.8*   No results for input(s): "LIPASE", "AMYLASE" in the last 168 hours. No results for input(s): "AMMONIA" in the last 168 hours. Coagulation Profile: No results for input(s): "INR", "PROTIME" in the last 168 hours. Cardiac Enzymes: No results for input(s): "CKTOTAL", "CKMB", "CKMBINDEX", "TROPONINI" in the last 168 hours. BNP (last 3 results) No results for input(s): "PROBNP" in the last 8760 hours. HbA1C: No results for input(s): "HGBA1C" in the last 72 hours. CBG: Recent Labs  Lab 03/24/23 1029 03/24/23 1133 03/24/23 1541 03/24/23 2333  GLUCAP 66* 63* 80 192*   Lipid Profile: No results for input(s): "CHOL", "HDL", "LDLCALC", "TRIG", "CHOLHDL", "LDLDIRECT" in the last 72 hours. Thyroid Function Tests: No results for input(s): "TSH", "T4TOTAL", "FREET4", "T3FREE", "THYROIDAB" in the last 72 hours. Anemia Panel: No results for input(s): "VITAMINB12", "FOLATE", "FERRITIN", "TIBC", "IRON", "RETICCTPCT" in the last 72 hours. Sepsis Labs: No results for input(s): "  PROCALCITON", "LATICACIDVEN" in the last 168 hours.  Recent Results (from the past 240 hours)  Resp panel by RT-PCR (RSV, Flu A&B, Covid) Anterior Nasal Swab     Status: None   Collection Time: 03/24/23 10:04 AM   Specimen: Anterior Nasal Swab  Result Value Ref Range Status   SARS Coronavirus 2 by RT PCR NEGATIVE NEGATIVE Final    Comment: (NOTE) SARS-CoV-2 target nucleic acids are NOT DETECTED.  The SARS-CoV-2 RNA is generally detectable in upper respiratory specimens during the acute phase of infection. The  lowest concentration of SARS-CoV-2 viral copies this assay can detect is 138 copies/mL. A negative result does not preclude SARS-Cov-2 infection and should not be used as the sole basis for treatment or other patient management decisions. A negative result may occur with  improper specimen collection/handling, submission of specimen other than nasopharyngeal swab, presence of viral mutation(s) within the areas targeted by this assay, and inadequate number of viral copies(<138 copies/mL). A negative result must be combined with clinical observations, patient history, and epidemiological information. The expected result is Negative.  Fact Sheet for Patients:  BloggerCourse.com  Fact Sheet for Healthcare Providers:  SeriousBroker.it  This test is no t yet approved or cleared by the [place] FDA and  has been authorized for detection and/or diagnosis of SARS-CoV-2 by FDA under an Emergency Use Authorization (EUA). This EUA will remain  in effect (meaning this test can be used) for the duration of the COVID-19 declaration under Section 564(b)(1) of the Act, 21 U.S.C.section 360bbb-3(b)(1), unless the authorization is terminated  or revoked sooner.       Influenza A by PCR NEGATIVE NEGATIVE Final   Influenza B by PCR NEGATIVE NEGATIVE Final    Comment: (NOTE) The Xpert Xpress SARS-CoV-2/FLU/RSV plus assay is intended as an aid in the diagnosis of influenza from Nasopharyngeal swab specimens and should not be used as a sole basis for treatment. Nasal washings and aspirates are unacceptable for Xpert Xpress SARS-CoV-2/FLU/RSV testing.  Fact Sheet for Patients: BloggerCourse.com  Fact Sheet for Healthcare Providers: SeriousBroker.it  This test is not yet approved or cleared by the [place] FDA and has been authorized for detection and/or diagnosis of SARS-CoV-2 by FDA under  an Emergency Use Authorization (EUA). This EUA will remain in effect (meaning this test can be used) for the duration of the COVID-19 declaration under Section 564(b)(1) of the Act, 21 U.S.C. section 360bbb-3(b)(1), unless the authorization is terminated or revoked.     Resp Syncytial Virus by PCR NEGATIVE NEGATIVE Final    Comment: (NOTE) Fact Sheet for Patients: BloggerCourse.com  Fact Sheet for Healthcare Providers: SeriousBroker.it  This test is not yet approved or cleared by the [place] FDA and has been authorized for detection and/or diagnosis of SARS-CoV-2 by FDA under an Emergency Use Authorization (EUA). This EUA will remain in effect (meaning this test can be used) for the duration of the COVID-19 declaration under Section 564(b)(1) of the Act, 21 U.S.C. section 360bbb-3(b)(1), unless the authorization is terminated or revoked.  Performed at [facility], [address] [place], [place] [address]          Radiology Studies: MR ANGIO HEAD WO CONTRAST Result Date: 03/24/2023 CLINICAL DATA:  Vision loss, monocular EXAM: MRA HEAD WITHOUT CONTRAST TECHNIQUE: Angiographic images of the Circle of Willis were acquired using MRA technique without intravenous contrast. COMPARISON:  03/17/2023 MRA FINDINGS: Anterior circulation: Both internal carotid arteries are patent to the termini, without significant stenosis. A1 segments patent.  Normal anterior communicating artery. Possible focal stenosis in the left A3 (series 3, image 120) is favored to be artifactual given motion artifact in several vessels at that location; on the prior exam, there was no motion in this location and no stenosis. Anterior cerebral arteries are patent to their distal aspects without significant stenosis. No M1 stenosis or occlusion. Distal MCA branches perfused to their distal aspects without significant stenosis. Posterior circulation: Vertebral arteries  patent to the vertebrobasilar junction without stenosis. Posterior inferior cerebral arteries patent bilaterally. Basilar patent to its distal aspect. Superior cerebellar arteries patent proximally. Patent P1 segments. PCAs perfused to their distal aspects without significant stenosis. The right posterior communicating artery is patent. Anatomic variants: None significant IMPRESSION: No intracranial large vessel occlusion or significant stenosis. Electronically Signed   By: Wiliam Ke M.D.   On: 03/24/2023 13:30        Scheduled Meds:  (feeding supplement) PROSource Plus  30 mL Oral TID BM   amiodarone  200 mg Oral Daily   aspirin EC  81 mg Oral Daily   atorvastatin  80 mg Oral QHS   [START ON 03/26/2023] calcitRIOL  2.25 mcg Oral Q T,Th,Sat-1800   calcium acetate  1,334 mg Oral TID WC   carvedilol  12.5 mg Oral BID   [START ON 03/26/2023] cinacalcet  60 mg Oral Q T,Th,Sat-1800   [START ON 03/29/2023] darbepoetin (ARANESP) injection - DIALYSIS  150 mcg Subcutaneous Q Tue-1800   dorzolamide-timolol  1 drop Right Eye BID   levETIRAcetam  500 mg Oral Once per day on Sunday Tuesday Thursday Saturday   levETIRAcetam  750 mg Oral Once per day on Monday Wednesday Friday   midodrine  5 mg Oral TID WC   multivitamin  1 tablet Oral QHS   nutrition supplement (JUVEN)  1 packet Oral BID BM   Continuous Infusions:   LOS: 0 days    Time spent: 51 minutes spent on chart review, discussion with nursing staff, consultants, updating family and interview/physical exam; more than 50% of that time was spent in counseling and/or coordination of care.    Alvira Philips [place], DO [facility] Available via Epic secure chat 7am-7pm After these hours, please refer to coverage provider listed on [email or web address] 03/25/2023, 11:28 AM

## 2023-03-25 NOTE — TOC Progression Note (Addendum)
Transition of Care (TOC) - Progression Note    Patient Details  Name: [name]. MRN: [id number] Date of Birth: 1970/11/29  Transition of Care [facility]) CM/SW Contact  Elliot Gault, LCSW Phone Number: 03/25/2023, 11:33 AM  Clinical Narrative:     TOC following. Reviewed SNF bed offer with pt's wife who accepts [facility]. Updated Jill [facility] at [facility] and [facility] to start insurance authorization.  Anticipating possible dc tomorrow after HD if insurance auth obtained. Asked HD RN if pt can be first run tomorrow in case we get the auth for SNF and he can dc.  TOC will follow.    Barriers to Discharge: Insurance Authorization  Expected Discharge Plan and Services                                               Social Determinants of Health (SDOH) Interventions SDOH Screenings   Food Insecurity: Patient Declined (02/04/2023)  Housing: Patient Declined (02/04/2023)  Transportation Needs: Patient Declined (02/04/2023)  Utilities: Patient Declined (02/04/2023)  Financial Resource Strain: High Risk (07/05/2017)  Physical Activity: Inactive (07/05/2017)  Social Connections: Moderately Integrated (07/05/2017)  Stress: Stress Concern Present (07/05/2017)  Tobacco Use: Low Risk  (03/25/2023)  Health Literacy: Low Risk  (06/01/2020)   Received from [facility]    Readmission Risk Interventions    07/27/2021   11:49 AM 11/27/2020    3:06 PM  Readmission Risk Prevention Plan  Transportation Screening Complete Complete  PCP or Specialist Appt within 3-5 Days  Complete  HRI or Home Care Consult  Complete  Social Work Consult for Recovery Care Planning/Counseling  Complete  Palliative Care Screening  Not Applicable  Medication Review Oceanographer) Complete Complete  SW Recovery Care/Counseling Consult Complete   Palliative Care Screening Not Applicable   Skilled Nursing Facility Not Applicable

## 2023-03-25 NOTE — Care Management Obs Status (Signed)
MEDICARE OBSERVATION STATUS NOTIFICATION   Patient Details  Name: [name]. MRN: [id number] Date of Birth: 02-Feb-1971   Medicare Observation Status Notification Given:  Yes [name], CMA, verbally reviewed observation notice with [name]  [name] stated he could only write an "x" as his signature due to a previous stroke. Signature provided was his best attempt.)    [name] 03/25/2023, 4:02 PM

## 2023-03-26 DIAGNOSIS — R531 Weakness: Secondary | ICD-10-CM | POA: Diagnosis not present

## 2023-03-26 LAB — RENAL FUNCTION PANEL
Albumin: 2.9 g/dL — ABNORMAL LOW (ref 3.5–5.0)
Anion gap: 10 (ref 5–15)
BUN: 70 mg/dL — ABNORMAL HIGH (ref 6–20)
CO2: 21 mmol/L — ABNORMAL LOW (ref 22–32)
Calcium: 8.8 mg/dL — ABNORMAL LOW (ref 8.9–10.3)
Chloride: 106 mmol/L (ref 98–111)
Creatinine, Ser: 10.25 mg/dL — ABNORMAL HIGH (ref 0.61–1.24)
GFR, Estimated: 5 mL/min — ABNORMAL LOW (ref >60)
Glucose, Bld: 70 mg/dL (ref 70–99)
Phosphorus: 6.4 mg/dL — ABNORMAL HIGH (ref 2.5–4.6)
Potassium: 6 mmol/L — ABNORMAL HIGH (ref 3.5–5.1)
Sodium: 137 mmol/L (ref 135–145)

## 2023-03-26 LAB — CBC
HCT: 27.4 % — ABNORMAL LOW (ref 39.0–52.0)
Hemoglobin: 7.8 g/dL — ABNORMAL LOW (ref 13.0–17.0)
MCH: 21.5 pg — ABNORMAL LOW (ref 26.0–34.0)
MCHC: 28.5 g/dL — ABNORMAL LOW (ref 30.0–36.0)
MCV: 75.5 fL — ABNORMAL LOW (ref 80.0–100.0)
Platelets: 182 10*3/uL (ref 150–400)
RBC: 3.63 MIL/uL — ABNORMAL LOW (ref 4.22–5.81)
RDW: 21.7 % — ABNORMAL HIGH (ref 11.5–15.5)
WBC: 7.2 10*3/uL (ref 4.0–10.5)
nRBC: 0 % (ref 0.0–0.2)

## 2023-03-26 LAB — RETICULOCYTES
Immature Retic Fract: 11.5 % (ref 2.3–15.9)
RBC.: 3.63 MIL/uL — ABNORMAL LOW (ref 4.22–5.81)
Retic Count, Absolute: 39.2 10*3/uL (ref 19.0–186.0)
Retic Ct Pct: 1.1 % (ref 0.4–3.1)

## 2023-03-26 LAB — GASTROINTESTINAL PANEL BY PCR, STOOL (REPLACES STOOL CULTURE)

## 2023-03-26 LAB — VITAMIN B12: Vitamin B-12: 501 pg/mL (ref 180–914)

## 2023-03-26 LAB — FOLATE: Folate: 6.1 ng/mL (ref >5.9)

## 2023-03-26 LAB — IRON AND TIBC
Iron: 43 ug/dL — ABNORMAL LOW (ref 45–182)
Saturation Ratios: 31 % (ref 17.9–39.5)
TIBC: 141 ug/dL — ABNORMAL LOW (ref 250–450)
UIBC: 98 ug/dL

## 2023-03-26 LAB — HEPATITIS B SURFACE ANTIGEN: Hepatitis B Surface Ag: NONREACTIVE

## 2023-03-26 LAB — FERRITIN: Ferritin: 820 ng/mL — ABNORMAL HIGH (ref 24–336)

## 2023-03-26 MED ORDER — OXYCODONE HCL 5 MG PO TABS
5.0000 mg | ORAL_TABLET | Freq: Once | ORAL | Status: AC | PRN
  Administered 2023-03-27: 5 mg via ORAL
  Filled 2023-03-26: qty 1

## 2023-03-26 MED ORDER — LIDOCAINE-PRILOCAINE 2.5-2.5 % EX CREA: 1.0000 | TOPICAL_CREAM | CUTANEOUS | Status: DC | PRN

## 2023-03-26 MED ORDER — LOPERAMIDE HCL 2 MG PO CAPS
4.0000 mg | ORAL_CAPSULE | Freq: Once | ORAL | Status: AC
  Administered 2023-03-27: 4 mg via ORAL
  Filled 2023-03-26: qty 2

## 2023-03-26 MED ORDER — OXYCODONE HCL 5 MG PO TABS
5.0000 mg | ORAL_TABLET | Freq: Once | ORAL | Status: AC | PRN
  Administered 2023-03-26: 5 mg via ORAL
  Filled 2023-03-26: qty 1

## 2023-03-26 MED ORDER — LIDOCAINE HCL (PF) 1 % IJ SOLN: 5.0000 mL | INTRAMUSCULAR | Status: DC | PRN

## 2023-03-26 MED ORDER — LOPERAMIDE HCL 2 MG PO CAPS
4.0000 mg | ORAL_CAPSULE | Freq: Once | ORAL | Status: AC
  Administered 2023-03-26: 4 mg via ORAL
  Filled 2023-03-26: qty 2

## 2023-03-26 MED ORDER — HEPARIN SODIUM (PORCINE) 1000 UNIT/ML DIALYSIS: 20.0000 [IU]/kg | INTRAMUSCULAR | Status: DC | PRN

## 2023-03-26 MED ORDER — PENTAFLUOROPROP-TETRAFLUOROETH EX AERO: 1.0000 | INHALATION_SPRAY | CUTANEOUS | Status: DC | PRN

## 2023-03-26 MED ORDER — PENTAFLUOROPROP-TETRAFLUOROETH EX AERO
INHALATION_SPRAY | CUTANEOUS | Status: AC
  Filled 2023-03-26: qty 30

## 2023-03-26 NOTE — Progress Notes (Signed)
PROGRESS NOTE    [name].  ZOX:[id number] DOB: 1971/02/11 DOA: 03/24/2023 PCP: [name], FNP    Brief Narrative:   [name] Cassaday. is a 53 y.o. male with past medical history significant for ESRD on HD TTS, recent right BKA ([name]), CAD, CVA, paroxysmal atrial fibrillation not on anticoagulation due to history of ICH, gastroparesis, type 2 diabetes mellitus, diabetic retinopathy, glaucoma, history of left eye enucleation who presented to [name], ED on 1/30 via EMS from dialysis unit with complaints of increased blurriness, weakness, cough and difficulty managing at home.  Patient states symptoms worse with dialysis.  Recently discharged from the hospital to SNF for 3 weeks and has been home over the last 10 days.  Patient reports is unable to take care of himself due to weakness and spouse working all the time.  Requesting placement.  In the ED, temperature 97.7 F, HR 64, RR 10, BP 147/79, SpO2 100% on room air.  WBC 5.3, hemoglobin 7.5, platelet count 167.  Sodium 139, potassium 4.0, chloride 104, CO2 23, glucose 76, BUN 43, creatinine 7.80.  AST 13, ALT 14, total bilirubin 0.7.  COVID/influenza/RSV PCR negative.  MR angiogram head without contrast with no intracranial large vessel occlusion or significant stenosis.  [name] discussed with ophthalmology on-call, [name] regarding his blurred vision in the setting of known diabetic retinopathy, glaucoma and history of left eye enucleation; ophthalmology recommended no other workup at this time and vision to be monitored and outpatient follow-up with his retina specialist.  [name] consulted for admission for further evaluation management of progressive weakness with TOC involvement for placement.  Assessment & Plan:   Generalized weakness/deconditioning/gait disturbance Patient presenting to the ED from his HD unit with dizziness, progressive weakness and difficulty managing at home.  Recent prolonged hospitalization followed  by subacute rehab after right BKA by Dr [name] on 02/09/2023. -- Continue PT/OT efforts while inpatient -- [facility] consulted for SNF placement; pending insurance authorization  History of gangrene right foot s/p BKA Patient with recent BKA February 09, 2023 by [name].  Recently seen in outpatient office visit 03/22/2023. -- Continue Daily Dial soap cleansing of all wounds, dry dressings.  Allevyn foam dressing to the left heel with dry dressing and shrinker to the right residual limb  -- Outpatient follow-up with orthopedics  ESRD on HD TTS Currently dialyzes at [facility]. -- Nephrology consulted for continued HD while inpatient; due today  Paroxysmal atrial fibrillation -- Amiodarone 2 mg p.o. daily -- Carvedilol 12.5 mg p.o. twice daily -- Not on anticoagulation due to history of ICH  Type 2 diabetes mellitus Hemoglobin A1c 4.5% on 11/21/2021.  Diet controlled at baseline. -- continue intermittent monitoring of glucose  Diabetic retinopathy, glaucoma and history of left eye enucleation Patient complaining of dizziness, known diabetic retinopathy, left eye enucleation follows with retina specialist outpatient.  [name] discussed with ophthalmology on-call, [name] regarding his blurred vision with no further recommendations other than outpatient follow-up with his retina specialist.  MRA brain with no large vessel occlusion or significant stenosis. -- Outpatient follow-up with ophthalmology  Seizure disorder -- Keppra 500 mg daily on Sunday/Tuesday/Thursday/Saturday -- Keppra 750 mg p.o. daily on Monday/Wednesday/Friday -- Outpatient follow-up with neurology  Anemia of chronic medical/renal disease -- Hemoglobin 7.8, MCV 75 -- Check anemia panel -- Transfuse for hemoglobin less than 7.0  Autonomic dysfunction -- Midodrine 5 mg p.o. 3 times daily   DVT prophylaxis: SCDs Start: 03/24/23 2107  Code Status: Full Code Family Communication: No family present at bedside  this morning  Disposition Plan:  Level of care: Med-Surg Status is: Observation The patient remains OBS appropriate and will d/c before 2 midnights.    Consultants:  Nephrology  Procedures:  None  Antimicrobials:  None   Subjective: Patient seen examined bedside, resting calmly.  Lying in bed.  Sleeping but easily arousable.  Awaiting for HD this morning.  Continues with generalized weakness.  No other specific questions, concerns or complaints at this time.  Denies headache, no chest pain, no palpitations, no shortness of breath, no abdominal pain, no fever/chills/night sweats, no nausea/vomiting/diarrhea, no focal weakness, no fatigue, no paresthesia.  No acute events overnight per nursing.  Medically stable for discharge to SNF once insurance authorization received  Objective: Vitals:   03/25/23 1116 03/25/23 1200 03/25/23 2125 03/26/23 0536  BP: 139/77 130/77 (!) 142/89 (!) 173/86  Pulse: 69 72 76 66  Resp: 16 18 18 18   Temp: 98.2 F (36.8 C) 98.2 F (36.8 C) 99.2 F (37.3 C) 97.6 F (36.4 C)  TempSrc: Oral Oral Oral Oral  SpO2: 96% 100% 98% 100%  Weight:      Height:        Intake/Output Summary (Last 24 hours) at 03/26/2023 1610 Last data filed at 03/25/2023 1804 Gross per 24 hour  Intake 480 ml  Output --  Net 480 ml   Filed Weights   03/24/23 0925  Weight: 79.4 kg    Examination:  Physical Exam: GEN: NAD, alert and oriented x 3, chronically ill in appearance, appears older than stated age HEENT: NCAT, noted enucleated left eye, MMM PULM: CTAB w/o wheezes/crackles, normal respiratory effort, on room air CV: RRR w/o M/G/R GI: abd soft, NTND, NABS, no R/G/M MSK: Right BKA site noted with Kerlix dressing in place, clean/dry/intact NEURO: CN II-XII intact, no focal deficits, sensation to light touch intact PSYCH: normal mood/affect Integumentary: Right BKA site as above, otherwise no other concerning rashes/lesions/wounds noted on exposed skin  surfaces.    Data Reviewed: I have personally reviewed following labs and imaging studies  CBC: Recent Labs  Lab 03/24/23 1034 03/25/23 0343 03/26/23 0526  WBC 5.3 5.4 7.2  NEUTROABS 2.7  --   --   HGB 7.5* 8.4* 7.8*  HCT 25.1* 29.0* 27.4*  MCV 75.1* 75.5* 75.5*  PLT 167 183 182   Basic Metabolic Panel: Recent Labs  Lab 03/24/23 1034 03/25/23 0343 03/26/23 0526  NA 139 141 137  K 4.0 4.6 6.0*  CL 104 108 106  CO2 23 20* 21*  GLUCOSE 76 81 70  BUN 43* 48* 70*  CREATININE 7.80* 9.25* 10.25*  CALCIUM 8.8* 9.1 8.8*  PHOS  --   --  6.4*   GFR: Estimated Creatinine Clearance: 9.1 mL/min (A) (by C-G formula based on SCr of 10.25 mg/dL (H)). Liver Function Tests: Recent Labs  Lab 03/24/23 1034 03/26/23 0526  AST 13*  --   ALT 14  --   ALKPHOS 62  --   BILITOT 0.7  --   PROT 6.4*  --   ALBUMIN 2.8* 2.9*   No results for input(s): "LIPASE", "AMYLASE" in the last 168 hours. No results for input(s): "AMMONIA" in the last 168 hours. Coagulation Profile: No results for input(s): "INR", "PROTIME" in the last 168 hours. Cardiac Enzymes: No results for input(s): "CKTOTAL", "CKMB", "CKMBINDEX", "TROPONINI" in the last 168 hours. BNP (last 3 results) No results for input(s): "PROBNP" in the last 8760  hours. HbA1C: No results for input(s): "HGBA1C" in the last 72 hours. CBG: Recent Labs  Lab 03/24/23 1029 03/24/23 1133 03/24/23 1541 03/24/23 2333 03/25/23 1558  GLUCAP 66* 63* 80 192* 91   Lipid Profile: No results for input(s): "CHOL", "HDL", "LDLCALC", "TRIG", "CHOLHDL", "LDLDIRECT" in the last 72 hours. Thyroid Function Tests: No results for input(s): "TSH", "T4TOTAL", "FREET4", "T3FREE", "THYROIDAB" in the last 72 hours. Anemia Panel: No results for input(s): "VITAMINB12", "FOLATE", "FERRITIN", "TIBC", "IRON", "RETICCTPCT" in the last 72 hours. Sepsis Labs: No results for input(s): "PROCALCITON", "LATICACIDVEN" in the last 168 hours.  Recent Results (from  the past 240 hours)  Resp panel by RT-PCR (RSV, Flu A&B, Covid) Anterior Nasal Swab     Status: None   Collection Time: 03/24/23 10:04 AM   Specimen: Anterior Nasal Swab  Result Value Ref Range Status   SARS Coronavirus 2 by RT PCR NEGATIVE NEGATIVE Final    Comment: (NOTE) SARS-CoV-2 target nucleic acids are NOT DETECTED.  The SARS-CoV-2 RNA is generally detectable in upper respiratory specimens during the acute phase of infection. The lowest concentration of SARS-CoV-2 viral copies this assay can detect is 138 copies/mL. A negative result does not preclude SARS-Cov-2 infection and should not be used as the sole basis for treatment or other patient management decisions. A negative result may occur with  improper specimen collection/handling, submission of specimen other than nasopharyngeal swab, presence of viral mutation(s) within the areas targeted by this assay, and inadequate number of viral copies(<138 copies/mL). A negative result must be combined with clinical observations, patient history, and epidemiological information. The expected result is Negative.  Fact Sheet for Patients:  BloggerCourse.com  Fact Sheet for Healthcare Providers:  SeriousBroker.it  This test is no t yet approved or cleared by the [place] FDA and  has been authorized for detection and/or diagnosis of SARS-CoV-2 by FDA under an Emergency Use Authorization (EUA). This EUA will remain  in effect (meaning this test can be used) for the duration of the COVID-19 declaration under Section 564(b)(1) of the Act, 21 U.S.C.section 360bbb-3(b)(1), unless the authorization is terminated  or revoked sooner.       Influenza A by PCR NEGATIVE NEGATIVE Final   Influenza B by PCR NEGATIVE NEGATIVE Final    Comment: (NOTE) The Xpert Xpress SARS-CoV-2/FLU/RSV plus assay is intended as an aid in the diagnosis of influenza from Nasopharyngeal swab specimens  and should not be used as a sole basis for treatment. Nasal washings and aspirates are unacceptable for Xpert Xpress SARS-CoV-2/FLU/RSV testing.  Fact Sheet for Patients: BloggerCourse.com  Fact Sheet for Healthcare Providers: SeriousBroker.it  This test is not yet approved or cleared by the [place] FDA and has been authorized for detection and/or diagnosis of SARS-CoV-2 by FDA under an Emergency Use Authorization (EUA). This EUA will remain in effect (meaning this test can be used) for the duration of the COVID-19 declaration under Section 564(b)(1) of the Act, 21 U.S.C. section 360bbb-3(b)(1), unless the authorization is terminated or revoked.     Resp Syncytial Virus by PCR NEGATIVE NEGATIVE Final    Comment: (NOTE) Fact Sheet for Patients: BloggerCourse.com  Fact Sheet for Healthcare Providers: SeriousBroker.it  This test is not yet approved or cleared by the [place] FDA and has been authorized for detection and/or diagnosis of SARS-CoV-2 by FDA under an Emergency Use Authorization (EUA). This EUA will remain in effect (meaning this test can be used) for the duration of the COVID-19 declaration under Section 564(b)(1) of  the Act, 21 U.S.C. section 360bbb-3(b)(1), unless the authorization is terminated or revoked.  Performed at [facility], [address], [place], [place] [address]   C Difficile Quick Screen w PCR reflex     Status: None   Collection Time: 03/25/23  3:50 AM   Specimen: Stool  Result Value Ref Range Status   C Diff antigen NEGATIVE NEGATIVE Final   C Diff toxin NEGATIVE NEGATIVE Final   C Diff interpretation No C. difficile detected.  Final    Comment: Performed at [facility], [address], [place], [place] [address]         Radiology Studies: MR ANGIO HEAD WO CONTRAST Result Date: 03/24/2023 CLINICAL DATA:  Vision loss, monocular  EXAM: MRA HEAD WITHOUT CONTRAST TECHNIQUE: Angiographic images of the Circle of Willis were acquired using MRA technique without intravenous contrast. COMPARISON:  03/17/2023 MRA FINDINGS: Anterior circulation: Both internal carotid arteries are patent to the termini, without significant stenosis. A1 segments patent. Normal anterior communicating artery. Possible focal stenosis in the left A3 (series 3, image 120) is favored to be artifactual given motion artifact in several vessels at that location; on the prior exam, there was no motion in this location and no stenosis. Anterior cerebral arteries are patent to their distal aspects without significant stenosis. No M1 stenosis or occlusion. Distal MCA branches perfused to their distal aspects without significant stenosis. Posterior circulation: Vertebral arteries patent to the vertebrobasilar junction without stenosis. Posterior inferior cerebral arteries patent bilaterally. Basilar patent to its distal aspect. Superior cerebellar arteries patent proximally. Patent P1 segments. PCAs perfused to their distal aspects without significant stenosis. The right posterior communicating artery is patent. Anatomic variants: None significant IMPRESSION: No intracranial large vessel occlusion or significant stenosis. Electronically Signed   By: Wiliam Ke M.D.   On: 03/24/2023 13:30        Scheduled Meds:  (feeding supplement) PROSource Plus  30 mL Oral TID BM   amiodarone  200 mg Oral Daily   aspirin EC  81 mg Oral Daily   atorvastatin  80 mg Oral QHS   calcitRIOL  2.25 mcg Oral Q T,Th,Sat-1800   calcium acetate  1,334 mg Oral TID WC   carvedilol  12.5 mg Oral BID   Chlorhexidine Gluconate Cloth  6 each Topical Q0600   cinacalcet  60 mg Oral Q T,Th,Sat-1800   [START ON 03/29/2023] darbepoetin (ARANESP) injection - DIALYSIS  150 mcg Subcutaneous Q Tue-1800   dorzolamide-timolol  1 drop Right Eye BID   levETIRAcetam  500 mg Oral Once per day on Sunday Tuesday  Thursday Saturday   levETIRAcetam  750 mg Oral Once per day on Monday Wednesday Friday   midodrine  5 mg Oral TID WC   multivitamin  1 tablet Oral QHS   nutrition supplement (JUVEN)  1 packet Oral BID BM   pneumococcal 20-valent conjugate vaccine  0.5 mL Intramuscular Tomorrow-1000   Continuous Infusions:   LOS: 0 days    Time spent: 45 minutes spent on chart review, discussion with nursing staff, consultants, updating family and interview/physical exam; more than 50% of that time was spent in counseling and/or coordination of care.    Alvira Philips [place], DO [facility] Available via Epic secure chat 7am-7pm After these hours, please refer to coverage provider listed on [email or web address] 03/26/2023, 8:34 AM

## 2023-03-26 NOTE — Procedures (Signed)
   NEPHROLOGY NURSING NOTE:  Below are serologies obtained from [facility]Everywhere:  LAB RESULTS - Infectious Diseases Infectious Diseases Result Type Result Value Relevant Reference Range Interpretation Date  Hep B core Ab Total (anti-HBc) Negative No Reference Range Provided - February 28, 2023  HCV Ab (anti-HCV) Nonreactive No Reference Range Provided - February 28, 2023  Hep B Surface Ab (anti-HBs) 15 mIU/mL No Reference Range Provided - February 28, 2023  Hep B Surface Ag (HBsAg) Negative No Reference Range Provided - February 28, 2023    Arman Filter, RN AP KDU

## 2023-03-26 NOTE — Progress Notes (Signed)
   HEMODIALYSIS TREATMENT NOTE:  3.25 hour heparin-free treatment completed using left upper arm AVF (15g/antegrade). Goal met: 1 liter removed without interruption in UF.  Pt asked to end session 90m early.  "I only run 3 hours.  I done 3:15."  All blood was returned.  Hemostasis was achieved in 20 minutes.  Post-HD:  03/26/23 2155  Vitals  Temp 98.3 F (36.8 C)  Temp Source Oral  BP 127/80  MAP (mmHg) 95  BP Location Right Arm  BP Method Automatic  Patient Position (if appropriate) Sitting  Pulse Rate 64  Pulse Rate Source Monitor  Resp 18  Oxygen Therapy  SpO2 99 %  O2 Device Room Air  Post Treatment  Dialyzer Clearance Lightly streaked  Hemodialysis Intake (mL) 0 mL  Liters Processed 72.4  Fluid Removed (mL) 1000 mL  Tolerated HD Treatment No (Comment)  Post-Hemodialysis Comments Signed off 33m early / AMA.  AVG/AVF Arterial Site Held (minutes) 10 minutes  AVG/AVF Venous Site Held (minutes) 10 minutes  Fistula / Graft Left Upper arm  No placement date or time found.   Orientation: Left  Access Location: Upper arm  Fistula / Graft Assessment Thrill;Bruit  Status Patent    Pt was transported back to 202.  Dinner was heated for him.  Hand-off was given to Ezequiel Ganser, Charity fundraiser.   Arman Filter, RN AP KDU

## 2023-03-26 NOTE — TOC Progression Note (Signed)
Transition of Care (TOC) - Progression Note    Patient Details  Name: Eagan Shifflett. MRN: [id number] Date of Birth: Sep 01, 1970  Transition of Care [facility]) CM/SW Contact  Otelia Santee, LCSW Phone Number: 03/26/2023, 11:23 AM  Clinical Narrative:    Insurance auth still pending for SNF. Will continue to check for approval throughout the day.      Barriers to Discharge: Insurance Authorization  Expected Discharge Plan and Services                                               Social Determinants of Health (SDOH) Interventions SDOH Screenings   Food Insecurity: No Food Insecurity (03/25/2023)  Housing: Low Risk  (03/25/2023)  Transportation Needs: No Transportation Needs (03/25/2023)  Utilities: Not At Risk (03/25/2023)  Financial Resource Strain: High Risk (07/05/2017)  Physical Activity: Inactive (07/05/2017)  Social Connections: Moderately Integrated (07/05/2017)  Stress: Stress Concern Present (07/05/2017)  Tobacco Use: Low Risk  (03/25/2023)  Health Literacy: Low Risk  (06/01/2020)   [facility] from [facility]    Readmission Risk Interventions    07/27/2021   11:49 AM 11/27/2020    3:06 PM  Readmission Risk Prevention Plan  Transportation Screening Complete Complete  PCP or Specialist Appt within 3-5 Days  Complete  HRI or Home Care Consult  Complete  Social Work Consult for Recovery Care Planning/Counseling  Complete  Palliative Care Screening  Not Applicable  Medication Review Oceanographer) Complete Complete  SW Recovery Care/Counseling Consult Complete   Palliative Care Screening Not Applicable   Skilled Nursing Facility Not Applicable

## 2023-03-27 DIAGNOSIS — R531 Weakness: Secondary | ICD-10-CM | POA: Diagnosis not present

## 2023-03-27 LAB — HEPATITIS B SURFACE ANTIBODY, QUANTITATIVE: Hep B S AB Quant (Post): 21.2 m[IU]/mL

## 2023-03-27 MED ORDER — OXYCODONE HCL 5 MG PO TABS
5.0000 mg | ORAL_TABLET | ORAL | Status: DC | PRN
  Administered 2023-03-27 – 2023-04-09 (×14): 5 mg via ORAL
  Filled 2023-03-27 (×15): qty 1

## 2023-03-27 MED ORDER — LOPERAMIDE HCL 2 MG PO CAPS
2.0000 mg | ORAL_CAPSULE | ORAL | Status: DC | PRN
  Administered 2023-03-27 – 2023-04-04 (×5): 2 mg via ORAL
  Filled 2023-03-27 (×5): qty 1

## 2023-03-27 MED ORDER — DIPHENHYDRAMINE HCL 25 MG PO CAPS
25.0000 mg | ORAL_CAPSULE | Freq: Once | ORAL | Status: AC
  Administered 2023-03-27: 25 mg via ORAL
  Filled 2023-03-27: qty 1

## 2023-03-27 MED ORDER — MEDIHONEY WOUND/BURN DRESSING EX PSTE
1.0000 | PASTE | Freq: Every day | CUTANEOUS | Status: AC
  Administered 2023-03-27 – 2023-04-09 (×12): 1 via TOPICAL
  Filled 2023-03-27 (×2): qty 44

## 2023-03-27 NOTE — TOC Progression Note (Signed)
Transition of Care (TOC) - Progression Note    Patient Details  Name: [name]. MRN: [id number] Date of Birth: [birth year]  Transition of Care [facility]) CM/SW Contact  Karn Cassis, [place] Phone Number: 03/27/2023, 2:15 PM  Clinical Narrative:  SNF auth still pending. TOC will continue to follow.        Barriers to Discharge: Insurance Authorization  Expected Discharge Plan and Services                                               Social Determinants of Health (SDOH) Interventions SDOH Screenings   Food Insecurity: No Food Insecurity (03/25/2023)  Housing: Low Risk  (03/25/2023)  Transportation Needs: No Transportation Needs (03/25/2023)  Utilities: Not At Risk (03/25/2023)  Financial Resource Strain: High Risk (07/05/2017)  Physical Activity: Inactive (07/05/2017)  Social Connections: Moderately Integrated (07/05/2017)  Stress: Stress Concern Present (07/05/2017)  Tobacco Use: Low Risk  (03/25/2023)  Health Literacy: Low Risk  (06/01/2020)   [facility] from [facility]    Readmission Risk Interventions    07/27/2021   11:49 AM 11/27/2020    3:06 PM  Readmission Risk Prevention Plan  Transportation Screening Complete Complete  PCP or Specialist Appt within 3-5 Days  Complete  HRI or Home Care Consult  Complete  Social Work Consult for Recovery Care Planning/Counseling  Complete  Palliative Care Screening  Not Applicable  Medication Review Oceanographer) Complete Complete  SW Recovery Care/Counseling Consult Complete   Palliative Care Screening Not Applicable   Skilled Nursing Facility Not Applicable

## 2023-03-27 NOTE — Consult Note (Signed)
WOC Nurse Consult Note: Reason for Consult: LE wounds Patient ESRD, recent amputation RLE per [name], presents with necrotic surgical incision and heel pressure injuries on the left Wound type: PAD and surgical  Pressure Injury POA: Yes left heel Measurement: see nursing flowsheet Wound bed: Left heel; lateral Stage 3 Pressure injury: macerated and pink Left heel; medial; healing; darkness of skin  Right stump; 100% necrosis of surgical incision with skin separation at wound edges  Drainage (amount, consistency, odor)  Periwound: intact  Dressing procedure/placement/frequency: Cleanse stump wound and left heel wounds with Ree Kida Hart [place] # [phone]), paint both areas with betadine, top with foam  Apply Medihoney to the stump wound, top with saline moist gauze, and kerlix with ACE wrap.   Consider consultation with [name] inpatient for necrotic stump wound  FU with Dr.Duda outpatient as scheudle Re consult if needed, will not follow at this time. Thanks  Tayson Schnelle [employer], RN,CWOCN, CNS, CWON-AP [phone])

## 2023-03-27 NOTE — Progress Notes (Addendum)
PROGRESS NOTE    [name].  BMW:[id number] DOB: Apr 05, 1970 DOA: 03/24/2023 PCP: [name], FNP    Brief Narrative:   [name]. is a 53 y.o. male with past medical history significant for ESRD on HD TTS, recent right BKA ([name]), CAD, CVA, paroxysmal atrial fibrillation not on anticoagulation due to history of ICH, gastroparesis, type 2 diabetes mellitus, diabetic retinopathy, glaucoma, history of left eye enucleation who presented to [name], ED on 1/30 via EMS from dialysis unit with complaints of increased blurriness, weakness, cough and difficulty managing at home.  Patient states symptoms worse with dialysis.  Recently discharged from the hospital to SNF for 3 weeks and has been home over the last 10 days.  Patient reports is unable to take care of himself due to weakness and spouse working all the time.  Requesting placement.  In the ED, temperature 97.7 F, HR 64, RR 10, BP 147/79, SpO2 100% on room air.  WBC 5.3, hemoglobin 7.5, platelet count 167.  Sodium 139, potassium 4.0, chloride 104, CO2 23, glucose 76, BUN 43, creatinine 7.80.  AST 13, ALT 14, total bilirubin 0.7.  COVID/influenza/RSV PCR negative.  MR angiogram head without contrast with no intracranial large vessel occlusion or significant stenosis.  [name] discussed with ophthalmology on-call, [name] regarding his blurred vision in the setting of known diabetic retinopathy, glaucoma and history of left eye enucleation; ophthalmology recommended no other workup at this time and vision to be monitored and outpatient follow-up with his retina specialist.  [name] consulted for admission for further evaluation management of progressive weakness with TOC involvement for placement.  Assessment & Plan:   Generalized weakness/deconditioning/gait disturbance Patient presenting to the ED from his HD unit with dizziness, progressive weakness and difficulty managing at home.  Recent prolonged hospitalization followed  by subacute rehab after right BKA by Dr [name] on 02/09/2023. -- Continue PT/OT efforts while inpatient -- [facility] consulted for SNF placement; pending insurance authorization  History of gangrene right foot s/p BKA Patient with recent BKA February 09, 2023 by [name].  Recently seen in outpatient office visit 03/22/2023. -- Cleanse stump wound and left heel wounds with Vahse, paint both areas with betadine, top with foam  -- Apply Medihoney to the stump wound, top with saline moist gauze, and kerlix with ACE wrap.  -- Outpatient follow-up with orthopedics  ESRD on HD TTS Currently dialyzes at [facility]. -- Nephrology consulted for continued HD while inpatient  Paroxysmal atrial fibrillation -- Amiodarone 2 mg p.o. daily -- Carvedilol 12.5 mg p.o. twice daily -- Not on anticoagulation due to history of ICH  Type 2 diabetes mellitus Hemoglobin A1c 4.5% on 11/21/2021.  Diet controlled at baseline. -- continue intermittent monitoring of glucose  Diabetic retinopathy, glaucoma and history of left eye enucleation Patient complaining of dizziness, known diabetic retinopathy, left eye enucleation follows with retina specialist outpatient.  [name] discussed with ophthalmology on-call, [name] regarding his blurred vision with no further recommendations other than outpatient follow-up with his retina specialist.  MRA brain with no large vessel occlusion or significant stenosis. -- Outpatient follow-up with ophthalmology  Seizure disorder -- Keppra 500 mg daily on Sunday/Tuesday/Thursday/Saturday -- Keppra 750 mg p.o. daily on Monday/Wednesday/Friday -- Outpatient follow-up with neurology  Anemia of chronic medical/renal disease -- Hemoglobin 7.8, MCV 75 -- Check anemia panel -- Transfuse for hemoglobin less than 7.0  Autonomic dysfunction -- Midodrine 5 mg p.o. 3 times daily   DVT prophylaxis: SCDs Start:  03/24/23 2107    Code Status: Full Code Family Communication: No  family present at bedside this morning  Disposition Plan:  Level of care: Med-Surg Status is: Observation The patient remains OBS appropriate and will d/c before 2 midnights.    Consultants:  Nephrology  Procedures:  None  Antimicrobials:  None   Subjective: Patient seen examined bedside, resting calmly.  Lying in bed.  Sleeping but easily arousable.  No complaints, concerns or questions this morning.  Had dialysis yesterday.  Denies headache, no chest pain, no palpitations, no shortness of breath, no abdominal pain, no fever/chills/night sweats, no nausea/vomiting/diarrhea, no focal weakness, no fatigue, no paresthesia.  No acute events overnight per nursing.  Medically stable for discharge to SNF once insurance authorization received  Objective: Vitals:   03/26/23 2130 03/26/23 2155 03/26/23 2247 03/27/23 0500  BP: (!) 148/78 127/80 113/78 118/72  Pulse: 69 64 66 62  Resp: 18 18 20 16   Temp:  98.3 F (36.8 C) 98.1 F (36.7 C) 98 F (36.7 C)  TempSrc:  Oral Oral Oral  SpO2:  99% 100% 99%  Weight:  69.3 kg    Height:        Intake/Output Summary (Last 24 hours) at 03/27/2023 0853 Last data filed at 03/27/2023 0242 Gross per 24 hour  Intake 240 ml  Output 1000 ml  Net -760 ml   Filed Weights   03/24/23 0925 03/26/23 1810 03/26/23 2155  Weight: 79.4 kg 70.2 kg 69.3 kg    Examination:  Physical Exam: GEN: NAD, alert and oriented x 3, chronically ill in appearance, appears older than stated age HEENT: NCAT, noted enucleated left eye, MMM PULM: CTAB w/o wheezes/crackles, normal respiratory effort, on room air CV: RRR w/o M/G/R GI: abd soft, NTND, NABS, no R/G/M MSK: Right BKA site noted with Kerlix dressing in place, clean/dry/intact NEURO: CN II-XII intact, no focal deficits, sensation to light touch intact PSYCH: normal mood/affect Integumentary: Right BKA site as above, otherwise no other concerning rashes/lesions/wounds noted on exposed skin  surfaces.    Data Reviewed: I have personally reviewed following labs and imaging studies  CBC: Recent Labs  Lab 03/24/23 1034 03/25/23 0343 03/26/23 0526  WBC 5.3 5.4 7.2  NEUTROABS 2.7  --   --   HGB 7.5* 8.4* 7.8*  HCT 25.1* 29.0* 27.4*  MCV 75.1* 75.5* 75.5*  PLT 167 183 182   Basic Metabolic Panel: Recent Labs  Lab 03/24/23 1034 03/25/23 0343 03/26/23 0526  NA 139 141 137  K 4.0 4.6 6.0*  CL 104 108 106  CO2 23 20* 21*  GLUCOSE 76 81 70  BUN 43* 48* 70*  CREATININE 7.80* 9.25* 10.25*  CALCIUM 8.8* 9.1 8.8*  PHOS  --   --  6.4*   GFR: Estimated Creatinine Clearance: 8 mL/min (A) (by C-G formula based on SCr of 10.25 mg/dL (H)). Liver Function Tests: Recent Labs  Lab 03/24/23 1034 03/26/23 0526  AST 13*  --   ALT 14  --   ALKPHOS 62  --   BILITOT 0.7  --   PROT 6.4*  --   ALBUMIN 2.8* 2.9*   No results for input(s): "LIPASE", "AMYLASE" in the last 168 hours. No results for input(s): "AMMONIA" in the last 168 hours. Coagulation Profile: No results for input(s): "INR", "PROTIME" in the last 168 hours. Cardiac Enzymes: No results for input(s): "CKTOTAL", "CKMB", "CKMBINDEX", "TROPONINI" in the last 168 hours. BNP (last 3 results) No results for input(s): "PROBNP" in the last  8760 hours. HbA1C: No results for input(s): "HGBA1C" in the last 72 hours. CBG: Recent Labs  Lab 03/24/23 1029 03/24/23 1133 03/24/23 1541 03/24/23 2333 03/25/23 1558  GLUCAP 66* 63* 80 192* 91   Lipid Profile: No results for input(s): "CHOL", "HDL", "LDLCALC", "TRIG", "CHOLHDL", "LDLDIRECT" in the last 72 hours. Thyroid Function Tests: No results for input(s): "TSH", "T4TOTAL", "FREET4", "T3FREE", "THYROIDAB" in the last 72 hours. Anemia Panel: Recent Labs    03/26/23 0526  VITAMINB12 501  FOLATE 6.1  FERRITIN 820*  TIBC 141*  IRON 43*  RETICCTPCT 1.1   Sepsis Labs: No results for input(s): "PROCALCITON", "LATICACIDVEN" in the last 168 hours.  Recent Results  (from the past 240 hours)  Resp panel by RT-PCR (RSV, Flu A&B, Covid) Anterior Nasal Swab     Status: None   Collection Time: 03/24/23 10:04 AM   Specimen: Anterior Nasal Swab  Result Value Ref Range Status   SARS Coronavirus 2 by RT PCR NEGATIVE NEGATIVE Final    Comment: (NOTE) SARS-CoV-2 target nucleic acids are NOT DETECTED.  The SARS-CoV-2 RNA is generally detectable in upper respiratory specimens during the acute phase of infection. The lowest concentration of SARS-CoV-2 viral copies this assay can detect is 138 copies/mL. A negative result does not preclude SARS-Cov-2 infection and should not be used as the sole basis for treatment or other patient management decisions. A negative result may occur with  improper specimen collection/handling, submission of specimen other than nasopharyngeal swab, presence of viral mutation(s) within the areas targeted by this assay, and inadequate number of viral copies(<138 copies/mL). A negative result must be combined with clinical observations, patient history, and epidemiological information. The expected result is Negative.  Fact Sheet for Patients:  BloggerCourse.com  Fact Sheet for Healthcare Providers:  SeriousBroker.it  This test is no t yet approved or cleared by the [place] FDA and  has been authorized for detection and/or diagnosis of SARS-CoV-2 by FDA under an Emergency Use Authorization (EUA). This EUA will remain  in effect (meaning this test can be used) for the duration of the COVID-19 declaration under Section 564(b)(1) of the Act, 21 U.S.C.section 360bbb-3(b)(1), unless the authorization is terminated  or revoked sooner.       Influenza A by PCR NEGATIVE NEGATIVE Final   Influenza B by PCR NEGATIVE NEGATIVE Final    Comment: (NOTE) The Xpert Xpress SARS-CoV-2/FLU/RSV plus assay is intended as an aid in the diagnosis of influenza from Nasopharyngeal swab  specimens and should not be used as a sole basis for treatment. Nasal washings and aspirates are unacceptable for Xpert Xpress SARS-CoV-2/FLU/RSV testing.  Fact Sheet for Patients: BloggerCourse.com  Fact Sheet for Healthcare Providers: SeriousBroker.it  This test is not yet approved or cleared by the [place] FDA and has been authorized for detection and/or diagnosis of SARS-CoV-2 by FDA under an Emergency Use Authorization (EUA). This EUA will remain in effect (meaning this test can be used) for the duration of the COVID-19 declaration under Section 564(b)(1) of the Act, 21 U.S.C. section 360bbb-3(b)(1), unless the authorization is terminated or revoked.     Resp Syncytial Virus by PCR NEGATIVE NEGATIVE Final    Comment: (NOTE) Fact Sheet for Patients: BloggerCourse.com  Fact Sheet for Healthcare Providers: SeriousBroker.it  This test is not yet approved or cleared by the [place] FDA and has been authorized for detection and/or diagnosis of SARS-CoV-2 by FDA under an Emergency Use Authorization (EUA). This EUA will remain in effect (meaning this test can  be used) for the duration of the COVID-19 declaration under Section 564(b)(1) of the Act, 21 U.S.C. section 360bbb-3(b)(1), unless the authorization is terminated or revoked.  Performed at [facility], [address], [place], [place] [address]   Gastrointestinal Panel by PCR , Stool     Status: None   Collection Time: 03/25/23  3:49 AM   Specimen: Stool  Result Value Ref Range Status   Campylobacter species NOT DETECTED NOT DETECTED Final   Plesimonas shigelloides NOT DETECTED NOT DETECTED Final   Salmonella species NOT DETECTED NOT DETECTED Final   Yersinia enterocolitica NOT DETECTED NOT DETECTED Final   Vibrio species NOT DETECTED NOT DETECTED Final   Vibrio cholerae NOT DETECTED NOT DETECTED Final    Enteroaggregative E coli (EAEC) NOT DETECTED NOT DETECTED Final   Enteropathogenic E coli (EPEC) NOT DETECTED NOT DETECTED Final   Enterotoxigenic E coli (ETEC) NOT DETECTED NOT DETECTED Final   Shiga like toxin producing E coli (STEC) NOT DETECTED NOT DETECTED Final   Shigella/Enteroinvasive E coli (EIEC) NOT DETECTED NOT DETECTED Final   Cryptosporidium NOT DETECTED NOT DETECTED Final   Cyclospora cayetanensis NOT DETECTED NOT DETECTED Final   Entamoeba histolytica NOT DETECTED NOT DETECTED Final   Giardia lamblia NOT DETECTED NOT DETECTED Final   Adenovirus F40/41 NOT DETECTED NOT DETECTED Final   Astrovirus NOT DETECTED NOT DETECTED Final   Norovirus GI/GII NOT DETECTED NOT DETECTED Final   Rotavirus A NOT DETECTED NOT DETECTED Final   Sapovirus (I, II, IV, and V) NOT DETECTED NOT DETECTED Final    Comment: Performed at [facility], [address] [place], [place] [address]  C Difficile Quick Screen w PCR reflex     Status: None   Collection Time: 03/25/23  3:50 AM   Specimen: Stool  Result Value Ref Range Status   C Diff antigen NEGATIVE NEGATIVE Final   C Diff toxin NEGATIVE NEGATIVE Final   C Diff interpretation No C. difficile detected.  Final    Comment: Performed at [facility], [address], [place], [place] [address]         Radiology Studies: No results found.       Scheduled Meds:  (feeding supplement) PROSource Plus  30 mL Oral TID BM   amiodarone  200 mg Oral Daily   aspirin EC  81 mg Oral Daily   atorvastatin  80 mg Oral QHS   calcitRIOL  2.25 mcg Oral Q T,Th,Sat-1800   calcium acetate  1,334 mg Oral TID WC   carvedilol  12.5 mg Oral BID   Chlorhexidine Gluconate Cloth  6 each Topical Q0600   cinacalcet  60 mg Oral Q T,Th,Sat-1800   [START ON 03/29/2023] darbepoetin (ARANESP) injection - DIALYSIS  150 mcg Subcutaneous Q Tue-1800   dorzolamide-timolol  1 drop Right Eye BID   levETIRAcetam  500 mg Oral Once per day on Sunday Tuesday Thursday  Saturday   levETIRAcetam  750 mg Oral Once per day on Monday Wednesday Friday   midodrine  5 mg Oral TID WC   multivitamin  1 tablet Oral QHS   nutrition supplement (JUVEN)  1 packet Oral BID BM   pneumococcal 20-valent conjugate vaccine  0.5 mL Intramuscular Tomorrow-1000   Continuous Infusions:   LOS: 0 days    Time spent: 45 minutes spent on chart review, discussion with nursing staff, consultants, updating family and interview/physical exam; more than 50% of that time was spent in counseling and/or coordination of care.  Alvira Philips [place], DO [facility] Available via Epic secure chat 7am-7pm After these hours, please refer to coverage provider listed on [email or web address] 03/27/2023, 8:53 AM

## 2023-03-28 ENCOUNTER — Ambulatory Visit: Payer: Medicare HMO | Admitting: Student

## 2023-03-28 DIAGNOSIS — R531 Weakness: Secondary | ICD-10-CM | POA: Diagnosis not present

## 2023-03-28 MED ORDER — ORAL CARE MOUTH RINSE: 15.0000 mL | OROMUCOSAL | Status: DC | PRN

## 2023-03-28 NOTE — Progress Notes (Signed)
PROGRESS NOTE    [name].  YNW:[id number] DOB: 06-Feb-1971 DOA: 03/24/2023 PCP: [name], FNP    Brief Narrative:   [name] Keziah. is a 53 y.o. male with past medical history significant for ESRD on HD TTS, recent right BKA ([name]), CAD, CVA, paroxysmal atrial fibrillation not on anticoagulation due to history of ICH, gastroparesis, type 2 diabetes mellitus, diabetic retinopathy, glaucoma, history of left eye enucleation who presented to [name], ED on 1/30 via EMS from dialysis unit with complaints of increased blurriness, weakness, cough and difficulty managing at home.  Patient states symptoms worse with dialysis.  Recently discharged from the hospital to SNF for 3 weeks and has been home over the last 10 days.  Patient reports is unable to take care of himself due to weakness and spouse working all the time.  Requesting placement.  In the ED, temperature 97.7 F, HR 64, RR 10, BP 147/79, SpO2 100% on room air.  WBC 5.3, hemoglobin 7.5, platelet count 167.  Sodium 139, potassium 4.0, chloride 104, CO2 23, glucose 76, BUN 43, creatinine 7.80.  AST 13, ALT 14, total bilirubin 0.7.  COVID/influenza/RSV PCR negative.  MR angiogram head without contrast with no intracranial large vessel occlusion or significant stenosis.  [name] discussed with ophthalmology on-call, [name] regarding his blurred vision in the setting of known diabetic retinopathy, glaucoma and history of left eye enucleation; ophthalmology recommended no other workup at this time and vision to be monitored and outpatient follow-up with his retina specialist.  [name] consulted for admission for further evaluation management of progressive weakness with TOC involvement for placement.  Assessment & Plan:   Generalized weakness/deconditioning/gait disturbance Patient presenting to the ED from his HD unit with dizziness, progressive weakness and difficulty managing at home.  Recent prolonged hospitalization followed  by subacute rehab after right BKA by Dr [name] on 02/09/2023. -- Continue PT/OT efforts while inpatient -- [facility] consulted for SNF placement; pending insurance authorization  History of gangrene right foot s/p BKA Patient with recent BKA February 09, 2023 by [name].  Recently seen in outpatient office visit 03/22/2023. -- Cleanse stump wound and left heel wounds with Vahse, paint both areas with betadine, top with foam  -- Apply Medihoney to the stump wound, top with saline moist gauze, and kerlix with ACE wrap.  -- Outpatient follow-up with orthopedics  ESRD on HD TTS Currently dialyzes at [facility]. -- Nephrology consulted for continued HD while inpatient  Paroxysmal atrial fibrillation -- Amiodarone 200 mg p.o. daily -- Carvedilol 12.5 mg p.o. twice daily -- Not on anticoagulation due to history of ICH  Type 2 diabetes mellitus Hemoglobin A1c 4.5% on 11/21/2021.  Diet controlled at baseline. -- continue intermittent monitoring of glucose  Diabetic retinopathy, glaucoma and history of left eye enucleation Patient complaining of dizziness, known diabetic retinopathy, left eye enucleation follows with retina specialist outpatient.  [name] discussed with ophthalmology on-call, [name] regarding his blurred vision with no further recommendations other than outpatient follow-up with his retina specialist.  MRA brain with no large vessel occlusion or significant stenosis. -- Outpatient follow-up with ophthalmology  Seizure disorder -- Keppra 500 mg daily on Sunday/Tuesday/Thursday/Saturday -- Keppra 750 mg p.o. daily on Monday/Wednesday/Friday -- Outpatient follow-up with neurology  Anemia of chronic medical/renal disease -- Hemoglobin 7.8, MCV 75 -- Check anemia panel -- Transfuse for hemoglobin less than 7.0  Autonomic dysfunction -- Midodrine 5 mg p.o. 3 times daily  Chronic diarrhea -- Imodium as  needed   DVT prophylaxis: SCDs Start: 03/24/23 2107    Code  Status: Full Code Family Communication: No family present at bedside this morning  Disposition Plan:  Level of care: Med-Surg Status is: Observation The patient remains OBS appropriate and will d/c before 2 midnights.    Consultants:  Nephrology  Procedures:  None  Antimicrobials:  None   Subjective: Patient seen examined bedside, resting calmly.  Lying in bed.  Sleeping but easily arousable.  Continues to report intermittent diarrhea which is chronic for him, improved with Imodium.  No complaints, concerns or questions this morning.  Denies headache, no chest pain, no palpitations, no shortness of breath, no abdominal pain, no fever/chills/night sweats, no nausea/vomiting, no focal weakness, no fatigue, no paresthesia.  No acute events overnight per nursing staff.  Medically stable for discharge to SNF once insurance authorization received  Objective: Vitals:   03/27/23 1356 03/27/23 1952 03/27/23 2124 03/28/23 0345  BP: 121/72 106/69 106/69 128/69  Pulse:  65 65 72  Resp: 16 16  16   Temp: 98.6 F (37 C) 98.2 F (36.8 C)  98.7 F (37.1 C)  TempSrc: Oral Oral  Oral  SpO2: 99% 99%  97%  Weight:      Height:       No intake or output data in the 24 hours ending 03/28/23 1128  Filed Weights   03/24/23 0925 03/26/23 1810 03/26/23 2155  Weight: 79.4 kg 70.2 kg 69.3 kg    Examination:  Physical Exam: GEN: NAD, alert and oriented x 3, chronically ill in appearance, appears older than stated age HEENT: NCAT, noted enucleated left eye, MMM PULM: CTAB w/o wheezes/crackles, normal respiratory effort, on room air CV: RRR w/o M/G/R GI: abd soft, NTND, NABS, no R/G/M MSK: Right BKA site noted with Kerlix dressing in place, clean/dry/intact NEURO: CN II-XII intact, no focal deficits, sensation to light touch intact PSYCH: normal mood/affect Integumentary: Right BKA site as above, otherwise no other concerning rashes/lesions/wounds noted on exposed skin surfaces.    Data  Reviewed: I have personally reviewed following labs and imaging studies  CBC: Recent Labs  Lab 03/24/23 1034 03/25/23 0343 03/26/23 0526  WBC 5.3 5.4 7.2  NEUTROABS 2.7  --   --   HGB 7.5* 8.4* 7.8*  HCT 25.1* 29.0* 27.4*  MCV 75.1* 75.5* 75.5*  PLT 167 183 182   Basic Metabolic Panel: Recent Labs  Lab 03/24/23 1034 03/25/23 0343 03/26/23 0526  NA 139 141 137  K 4.0 4.6 6.0*  CL 104 108 106  CO2 23 20* 21*  GLUCOSE 76 81 70  BUN 43* 48* 70*  CREATININE 7.80* 9.25* 10.25*  CALCIUM 8.8* 9.1 8.8*  PHOS  --   --  6.4*   GFR: Estimated Creatinine Clearance: 8 mL/min (A) (by C-G formula based on SCr of 10.25 mg/dL (H)). Liver Function Tests: Recent Labs  Lab 03/24/23 1034 03/26/23 0526  AST 13*  --   ALT 14  --   ALKPHOS 62  --   BILITOT 0.7  --   PROT 6.4*  --   ALBUMIN 2.8* 2.9*   No results for input(s): "LIPASE", "AMYLASE" in the last 168 hours. No results for input(s): "AMMONIA" in the last 168 hours. Coagulation Profile: No results for input(s): "INR", "PROTIME" in the last 168 hours. Cardiac Enzymes: No results for input(s): "CKTOTAL", "CKMB", "CKMBINDEX", "TROPONINI" in the last 168 hours. BNP (last 3 results) No results for input(s): "PROBNP" in the last 8760 hours. HbA1C: No  results for input(s): "HGBA1C" in the last 72 hours. CBG: Recent Labs  Lab 03/24/23 1029 03/24/23 1133 03/24/23 1541 03/24/23 2333 03/25/23 1558  GLUCAP 66* 63* 80 192* 91   Lipid Profile: No results for input(s): "CHOL", "HDL", "LDLCALC", "TRIG", "CHOLHDL", "LDLDIRECT" in the last 72 hours. Thyroid Function Tests: No results for input(s): "TSH", "T4TOTAL", "FREET4", "T3FREE", "THYROIDAB" in the last 72 hours. Anemia Panel: Recent Labs    03/26/23 0526  VITAMINB12 501  FOLATE 6.1  FERRITIN 820*  TIBC 141*  IRON 43*  RETICCTPCT 1.1   Sepsis Labs: No results for input(s): "PROCALCITON", "LATICACIDVEN" in the last 168 hours.  Recent Results (from the past 240  hours)  Resp panel by RT-PCR (RSV, Flu A&B, Covid) Anterior Nasal Swab     Status: None   Collection Time: 03/24/23 10:04 AM   Specimen: Anterior Nasal Swab  Result Value Ref Range Status   SARS Coronavirus 2 by RT PCR NEGATIVE NEGATIVE Final    Comment: (NOTE) SARS-CoV-2 target nucleic acids are NOT DETECTED.  The SARS-CoV-2 RNA is generally detectable in upper respiratory specimens during the acute phase of infection. The lowest concentration of SARS-CoV-2 viral copies this assay can detect is 138 copies/mL. A negative result does not preclude SARS-Cov-2 infection and should not be used as the sole basis for treatment or other patient management decisions. A negative result may occur with  improper specimen collection/handling, submission of specimen other than nasopharyngeal swab, presence of viral mutation(s) within the areas targeted by this assay, and inadequate number of viral copies(<138 copies/mL). A negative result must be combined with clinical observations, patient history, and epidemiological information. The expected result is Negative.  Fact Sheet for Patients:  BloggerCourse.com  Fact Sheet for Healthcare Providers:  SeriousBroker.it  This test is no t yet approved or cleared by the [place] FDA and  has been authorized for detection and/or diagnosis of SARS-CoV-2 by FDA under an Emergency Use Authorization (EUA). This EUA will remain  in effect (meaning this test can be used) for the duration of the COVID-19 declaration under Section 564(b)(1) of the Act, 21 U.S.C.section 360bbb-3(b)(1), unless the authorization is terminated  or revoked sooner.       Influenza A by PCR NEGATIVE NEGATIVE Final   Influenza B by PCR NEGATIVE NEGATIVE Final    Comment: (NOTE) The Xpert Xpress SARS-CoV-2/FLU/RSV plus assay is intended as an aid in the diagnosis of influenza from Nasopharyngeal swab specimens and should not  be used as a sole basis for treatment. Nasal washings and aspirates are unacceptable for Xpert Xpress SARS-CoV-2/FLU/RSV testing.  Fact Sheet for Patients: BloggerCourse.com  Fact Sheet for Healthcare Providers: SeriousBroker.it  This test is not yet approved or cleared by the [place] FDA and has been authorized for detection and/or diagnosis of SARS-CoV-2 by FDA under an Emergency Use Authorization (EUA). This EUA will remain in effect (meaning this test can be used) for the duration of the COVID-19 declaration under Section 564(b)(1) of the Act, 21 U.S.C. section 360bbb-3(b)(1), unless the authorization is terminated or revoked.     Resp Syncytial Virus by PCR NEGATIVE NEGATIVE Final    Comment: (NOTE) Fact Sheet for Patients: BloggerCourse.com  Fact Sheet for Healthcare Providers: SeriousBroker.it  This test is not yet approved or cleared by the [place] FDA and has been authorized for detection and/or diagnosis of SARS-CoV-2 by FDA under an Emergency Use Authorization (EUA). This EUA will remain in effect (meaning this test can be used) for the  duration of the COVID-19 declaration under Section 564(b)(1) of the Act, 21 U.S.C. section 360bbb-3(b)(1), unless the authorization is terminated or revoked.  Performed at [facility], [address] [place], [place] [address]   Gastrointestinal Panel by PCR , Stool     Status: None   Collection Time: 03/25/23  3:49 AM   Specimen: Stool  Result Value Ref Range Status   Campylobacter species NOT DETECTED NOT DETECTED Final   Plesimonas shigelloides NOT DETECTED NOT DETECTED Final   Salmonella species NOT DETECTED NOT DETECTED Final   Yersinia enterocolitica NOT DETECTED NOT DETECTED Final   Vibrio species NOT DETECTED NOT DETECTED Final   Vibrio cholerae NOT DETECTED NOT DETECTED Final   Enteroaggregative E coli (EAEC)  NOT DETECTED NOT DETECTED Final   Enteropathogenic E coli (EPEC) NOT DETECTED NOT DETECTED Final   Enterotoxigenic E coli (ETEC) NOT DETECTED NOT DETECTED Final   Shiga like toxin producing E coli (STEC) NOT DETECTED NOT DETECTED Final   Shigella/Enteroinvasive E coli (EIEC) NOT DETECTED NOT DETECTED Final   Cryptosporidium NOT DETECTED NOT DETECTED Final   Cyclospora cayetanensis NOT DETECTED NOT DETECTED Final   Entamoeba histolytica NOT DETECTED NOT DETECTED Final   Giardia lamblia NOT DETECTED NOT DETECTED Final   Adenovirus F40/41 NOT DETECTED NOT DETECTED Final   Astrovirus NOT DETECTED NOT DETECTED Final   Norovirus GI/GII NOT DETECTED NOT DETECTED Final   Rotavirus A NOT DETECTED NOT DETECTED Final   Sapovirus (I, II, IV, and V) NOT DETECTED NOT DETECTED Final    Comment: Performed at [facility], [address] [place], [place] [address]  C Difficile Quick Screen w PCR reflex     Status: None   Collection Time: 03/25/23  3:50 AM   Specimen: Stool  Result Value Ref Range Status   C Diff antigen NEGATIVE NEGATIVE Final   C Diff toxin NEGATIVE NEGATIVE Final   C Diff interpretation No C. difficile detected.  Final    Comment: Performed at [facility], [address] [place], [place] [address]         Radiology Studies: No results found.       Scheduled Meds:  (feeding supplement) PROSource Plus  30 mL Oral TID BM   amiodarone  200 mg Oral Daily   aspirin EC  81 mg Oral Daily   atorvastatin  80 mg Oral QHS   calcitRIOL  2.25 mcg Oral Q T,Th,Sat-1800   calcium acetate  1,334 mg Oral TID WC   carvedilol  12.5 mg Oral BID   Chlorhexidine Gluconate Cloth  6 each Topical Q0600   cinacalcet  60 mg Oral Q T,Th,Sat-1800   [START ON 03/29/2023] darbepoetin (ARANESP) injection - DIALYSIS  150 mcg Subcutaneous Q Tue-1800   dorzolamide-timolol  1 drop Right Eye BID   leptospermum manuka honey  1 Application Topical Daily   levETIRAcetam  500 mg Oral Once per day  on Sunday Tuesday Thursday Saturday   levETIRAcetam  750 mg Oral Once per day on Monday Wednesday Friday   midodrine  5 mg Oral TID WC   multivitamin  1 tablet Oral QHS   nutrition supplement (JUVEN)  1 packet Oral BID BM   pneumococcal 20-valent conjugate vaccine  0.5 mL Intramuscular Tomorrow-1000   Continuous Infusions:   LOS: 0 days    Time spent: 45 minutes spent on chart review, discussion with nursing staff, consultants, updating family and interview/physical exam; more than 50% of that time was spent in counseling and/or  coordination of care.    Alvira Philips [place], DO [facility] Available via Epic secure chat 7am-7pm After these hours, please refer to coverage provider listed on [email or web address] 03/28/2023, 11:28 AM

## 2023-03-28 NOTE — Plan of Care (Signed)

## 2023-03-28 NOTE — Plan of Care (Signed)
  Problem: Education: Goal: Knowledge of General Education information will improve Description: Including pain rating scale, medication(s)/side effects and non-pharmacologic comfort measures Outcome: Progressing   Problem: Nutrition: Goal: Adequate nutrition will be maintained Outcome: Progressing   Problem: Coping: Goal: Level of anxiety will decrease Outcome: Progressing   Problem: Activity: Goal: Risk for activity intolerance will decrease Outcome: Not Progressing   

## 2023-03-28 NOTE — Progress Notes (Signed)
Patient ID: [name] Barns., male   DOB: [birth year], 53 y.o.   MRN: [id number] S: No new complaints O:BP 128/69 (BP Location: Right Arm)   Pulse 72   Temp 98.7 F (37.1 C) (Oral)   Resp 16   Ht 6\' 3"  (1.905 m)   Wt 69.3 kg   SpO2 97%   BMI 19.10 kg/m  No intake or output data in the 24 hours ending 03/28/23 0918 Intake/Output: I/O last 3 completed shifts: In: 240 [P.O.:240] Out: 1000 [Other:1000]  Intake/Output this shift:  No intake/output data recorded. Weight change:  Gen: NAD CVS: RRR Resp:CTA Abd: +BS, soft, TN/ND Ext: no edema, LUE AVF +T/B  Recent Labs  Lab 03/24/23 1034 03/25/23 0343 03/26/23 0526  NA 139 141 137  K 4.0 4.6 6.0*  CL 104 108 106  CO2 23 20* 21*  GLUCOSE 76 81 70  BUN 43* 48* 70*  CREATININE 7.80* 9.25* 10.25*  ALBUMIN 2.8*  --  2.9*  CALCIUM 8.8* 9.1 8.8*  PHOS  --   --  6.4*  AST 13*  --   --   ALT 14  --   --    Liver Function Tests: Recent Labs  Lab 03/24/23 1034 03/26/23 0526  AST 13*  --   ALT 14  --   ALKPHOS 62  --   BILITOT 0.7  --   PROT 6.4*  --   ALBUMIN 2.8* 2.9*   No results for input(s): "LIPASE", "AMYLASE" in the last 168 hours. No results for input(s): "AMMONIA" in the last 168 hours. CBC: Recent Labs  Lab 03/24/23 1034 03/25/23 0343 03/26/23 0526  WBC 5.3 5.4 7.2  NEUTROABS 2.7  --   --   HGB 7.5* 8.4* 7.8*  HCT 25.1* 29.0* 27.4*  MCV 75.1* 75.5* 75.5*  PLT 167 183 182   Cardiac Enzymes: No results for input(s): "CKTOTAL", "CKMB", "CKMBINDEX", "TROPONINI" in the last 168 hours. CBG: Recent Labs  Lab 03/24/23 1029 03/24/23 1133 03/24/23 1541 03/24/23 2333 03/25/23 1558  GLUCAP 66* 63* 80 192* 91    Iron Studies:  Recent Labs    03/26/23 0526  IRON 43*  TIBC 141*  FERRITIN 820*   Studies/Results: No results found.  (feeding supplement) PROSource Plus  30 mL Oral TID BM   amiodarone  200 mg Oral Daily   aspirin EC  81 mg Oral Daily   atorvastatin  80 mg Oral QHS   calcitRIOL  2.25  mcg Oral Q T,Th,Sat-1800   calcium acetate  1,334 mg Oral TID WC   carvedilol  12.5 mg Oral BID   Chlorhexidine Gluconate Cloth  6 each Topical Q0600   cinacalcet  60 mg Oral Q T,Th,Sat-1800   [START ON 03/29/2023] darbepoetin (ARANESP) injection - DIALYSIS  150 mcg Subcutaneous Q Tue-1800   dorzolamide-timolol  1 drop Right Eye BID   leptospermum manuka honey  1 Application Topical Daily   levETIRAcetam  500 mg Oral Once per day on Sunday Tuesday Thursday Saturday   levETIRAcetam  750 mg Oral Once per day on Monday Wednesday Friday   midodrine  5 mg Oral TID WC   multivitamin  1 tablet Oral QHS   nutrition supplement (JUVEN)  1 packet Oral BID BM   pneumococcal 20-valent conjugate vaccine  0.5 mL Intramuscular Tomorrow-1000    BMET    Component Value Date/Time   NA 137 03/26/2023 0526   K 6.0 (H) 03/26/2023 0526   CL 106 03/26/2023 0526   CO2  21 (L) 03/26/2023 0526   GLUCOSE 70 03/26/2023 0526   BUN 70 (H) 03/26/2023 0526   CREATININE 10.25 (H) 03/26/2023 0526   CALCIUM 8.8 (L) 03/26/2023 0526   GFRNONAA 5 (L) 03/26/2023 0526   GFRAA 5 (L) 08/27/2019 1540   CBC    Component Value Date/Time   WBC 7.2 03/26/2023 0526   RBC 3.63 (L) 03/26/2023 0526   RBC 3.63 (L) 03/26/2023 0526   HGB 7.8 (L) 03/26/2023 0526   HCT 27.4 (L) 03/26/2023 0526   PLT 182 03/26/2023 0526   MCV 75.5 (L) 03/26/2023 0526   MCH 21.5 (L) 03/26/2023 0526   MCHC 28.5 (L) 03/26/2023 0526   RDW 21.7 (H) 03/26/2023 0526   LYMPHSABS 1.6 03/24/2023 1034   MONOABS 0.5 03/24/2023 1034   EOSABS 0.4 03/24/2023 1034   BASOSABS 0.1 03/24/2023 1034   Outpatient HD orders:  [facility]  TTS  3 hrs and 15 minutes EDW 72 kg but being assessed after amputation and hospitalization  BF 400  DF 800  LUE AVF  2K/2Ca Last post weight was 71.7 kg on on 1/30 Medications - they have not gotten labs yet at [name] so he hasn't gotten ESA restarted or any other scheduled in-center meds.  He came back to [facility] on  03/17/23 Heparin 400 units/hr and a loading dose of 1000 units     Assessment/Plan:   # ESRD  - HD per TTS schedule  - He has most recently been at [facility] but this may need to change depending on SNF placement    # Atrial fibrillation  - agents per primary team - he is on amio and coreg - note that midodrine is ordered for support to allow these agents as well    # Anemia of CKD - No ESA given yet at [facility] per outpatient RN - she states that he has been there since 03/17/23  - ESA can be given at least 03/29/23 - will order in the event that he is inpatient.  (Aranesp 150 mcg every Tuesday is ordered)   # Metabolic bone disease - on sensipar and calcitriol per prior regimen currently - calcium acceptable   - renal panel in AM   # Weakness - Patient/family unable to care for him at home and SNF has been recommended    Disposition per primary team.  Per charting awaiting SNF.  Note that once his SNF is confirmed will need to ensure that he has a HD unit nearby  Irena Cords, MD [facility]

## 2023-03-29 DIAGNOSIS — R531 Weakness: Secondary | ICD-10-CM | POA: Diagnosis not present

## 2023-03-29 LAB — CBC
HCT: 25.4 % — ABNORMAL LOW (ref 39.0–52.0)
Hemoglobin: 7.7 g/dL — ABNORMAL LOW (ref 13.0–17.0)
MCH: 22.8 pg — ABNORMAL LOW (ref 26.0–34.0)
MCHC: 30.3 g/dL (ref 30.0–36.0)
MCV: 75.1 fL — ABNORMAL LOW (ref 80.0–100.0)
Platelets: 181 10*3/uL (ref 150–400)
RBC: 3.38 MIL/uL — ABNORMAL LOW (ref 4.22–5.81)
RDW: 21.2 % — ABNORMAL HIGH (ref 11.5–15.5)
WBC: 6 10*3/uL (ref 4.0–10.5)
nRBC: 0 % (ref 0.0–0.2)

## 2023-03-29 LAB — RENAL FUNCTION PANEL
Albumin: 2.7 g/dL — ABNORMAL LOW (ref 3.5–5.0)
Anion gap: 14 (ref 5–15)
BUN: 89 mg/dL — ABNORMAL HIGH (ref 6–20)
CO2: 22 mmol/L (ref 22–32)
Calcium: 9 mg/dL (ref 8.9–10.3)
Chloride: 99 mmol/L (ref 98–111)
Creatinine, Ser: 10.54 mg/dL — ABNORMAL HIGH (ref 0.61–1.24)
GFR, Estimated: 5 mL/min — ABNORMAL LOW (ref >60)
Glucose, Bld: 75 mg/dL (ref 70–99)
Phosphorus: 7.1 mg/dL — ABNORMAL HIGH (ref 2.5–4.6)
Potassium: 6 mmol/L — ABNORMAL HIGH (ref 3.5–5.1)
Sodium: 135 mmol/L (ref 135–145)

## 2023-03-29 MED ORDER — PENTAFLUOROPROP-TETRAFLUOROETH EX AERO
INHALATION_SPRAY | CUTANEOUS | Status: AC
  Filled 2023-03-29: qty 30

## 2023-03-29 NOTE — Progress Notes (Signed)
 Patient ID: [name]., male   DOB: 08/07/1970, 53 y.o.   MRN: [id number] S: No complaints or events overnight O:BP 126/72 (BP Location: Right Arm)   Pulse 65   Temp 98 F (36.7 C) (Oral)   Resp 18   Ht 6' 3 (1.905 m)   Wt 69.3 kg   SpO2 97%   BMI 19.10 kg/m  No intake or output data in the 24 hours ending 03/29/23 0927 Intake/Output: No intake/output data recorded.  Intake/Output this shift:  No intake/output data recorded. Weight change:  Gen: NAD CVS: RRR Resp:CTA Abd: +BS, soft, NT/ND Ext: no edema, s/p RBKA, LUE AVG +T/B  Recent Labs  Lab 03/24/23 1034 03/25/23 0343 03/26/23 0526  NA 139 141 137  K 4.0 4.6 6.0*  CL 104 108 106  CO2 23 20* 21*  GLUCOSE 76 81 70  BUN 43* 48* 70*  CREATININE 7.80* 9.25* 10.25*  ALBUMIN  2.8*  --  2.9*  CALCIUM  8.8* 9.1 8.8*  PHOS  --   --  6.4*  AST 13*  --   --   ALT 14  --   --    Liver Function Tests: Recent Labs  Lab 03/24/23 1034 03/26/23 0526  AST 13*  --   ALT 14  --   ALKPHOS 62  --   BILITOT 0.7  --   PROT 6.4*  --   ALBUMIN  2.8* 2.9*   No results for input(s): LIPASE, AMYLASE in the last 168 hours. No results for input(s): AMMONIA in the last 168 hours. CBC: Recent Labs  Lab 03/24/23 1034 03/25/23 0343 03/26/23 0526  WBC 5.3 5.4 7.2  NEUTROABS 2.7  --   --   HGB 7.5* 8.4* 7.8*  HCT 25.1* 29.0* 27.4*  MCV 75.1* 75.5* 75.5*  PLT 167 183 182   Cardiac Enzymes: No results for input(s): CKTOTAL, CKMB, CKMBINDEX, TROPONINI in the last 168 hours. CBG: Recent Labs  Lab 03/24/23 1029 03/24/23 1133 03/24/23 1541 03/24/23 2333 03/25/23 1558  GLUCAP 66* 63* 80 192* 91    Iron Studies: No results for input(s): IRON, TIBC, TRANSFERRIN, FERRITIN in the last 72 hours. Studies/Results: No results found.  (feeding supplement) PROSource Plus  30 mL Oral TID BM   amiodarone   200 mg Oral Daily   aspirin  EC  81 mg Oral Daily   atorvastatin   80 mg Oral QHS   calcitRIOL   2.25  mcg Oral Q T,Th,Sat-1800   calcium  acetate  1,334 mg Oral TID WC   carvedilol   12.5 mg Oral BID   Chlorhexidine  Gluconate Cloth  6 each Topical Q0600   cinacalcet   60 mg Oral Q T,Th,Sat-1800   darbepoetin (ARANESP ) injection - DIALYSIS  150 mcg Subcutaneous Q Tue-1800   dorzolamide -timolol   1 drop Right Eye BID   leptospermum manuka honey  1 Application Topical Daily   levETIRAcetam   500 mg Oral Once per day on Sunday Tuesday Thursday Saturday   levETIRAcetam   750 mg Oral Once per day on Monday Wednesday Friday   midodrine   5 mg Oral TID WC   multivitamin  1 tablet Oral QHS   nutrition supplement (JUVEN)  1 packet Oral BID BM   pneumococcal 20-valent conjugate vaccine  0.5 mL Intramuscular Tomorrow-1000    BMET    Component Value Date/Time   NA 137 03/26/2023 0526   K 6.0 (H) 03/26/2023 0526   CL 106 03/26/2023 0526   CO2 21 (L) 03/26/2023 0526   GLUCOSE 70 03/26/2023 0526  BUN 70 (H) 03/26/2023 0526   CREATININE 10.25 (H) 03/26/2023 0526   CALCIUM  8.8 (L) 03/26/2023 0526   GFRNONAA 5 (L) 03/26/2023 0526   GFRAA 5 (L) 08/27/2019 1540   CBC    Component Value Date/Time   WBC 7.2 03/26/2023 0526   RBC 3.63 (L) 03/26/2023 0526   RBC 3.63 (L) 03/26/2023 0526   HGB 7.8 (L) 03/26/2023 0526   HCT 27.4 (L) 03/26/2023 0526   PLT 182 03/26/2023 0526   MCV 75.5 (L) 03/26/2023 0526   MCH 21.5 (L) 03/26/2023 0526   MCHC 28.5 (L) 03/26/2023 0526   RDW 21.7 (H) 03/26/2023 0526   LYMPHSABS 1.6 03/24/2023 1034   MONOABS 0.5 03/24/2023 1034   EOSABS 0.4 03/24/2023 1034   BASOSABS 0.1 03/24/2023 1034    Outpatient HD orders:  [facility]  TTS  3 hrs and 15 minutes EDW 72 kg but being assessed after amputation and hospitalization  BF 400  DF 800  LUE AVF  2K/2Ca Last post weight was 71.7 kg on on 1/30 Medications - they have not gotten labs yet at [facility] so he hasn't gotten ESA restarted or any other scheduled in-center meds.  He came back to [facility] on 03/17/23 Heparin   400 units/hr and a loading dose of 1000 units     Assessment/Plan:   # ESRD  - HD per TTS schedule  - He has most recently been at [facility] but this may need to change depending on SNF placement    # Atrial fibrillation  - agents per primary team - he is on amio and coreg  - note that midodrine  is ordered for support to allow these agents as well    # Anemia of CKD - No ESA given yet at [facility] per outpatient RN - she states that he has been there since 03/17/23  - ESA can be given at least 03/29/23 - will order in the event that he is inpatient.  (Aranesp  150 mcg every Tuesday is ordered)   # Metabolic bone disease - on sensipar  and calcitriol  per prior regimen currently - calcium  acceptable   - renal panel in AM   # Weakness - Patient/family unable to care for him at home and SNF has been recommended    Disposition per primary team.  Per charting awaiting SNF.  Note that once his SNF is confirmed will need to ensure that he has a HD unit nearby  Fairy RONAL Sellar, MD [facility]

## 2023-03-29 NOTE — Plan of Care (Signed)
  Problem: Education: Goal: Knowledge of General Education information will improve Description: Including pain rating scale, medication(s)/side effects and non-pharmacologic comfort measures Outcome: Progressing   Problem: Health Behavior/Discharge Planning: Goal: Ability to manage health-related needs will improve Outcome: Progressing   Problem: Activity: Goal: Risk for activity intolerance will decrease Outcome: Progressing   Problem: Skin Integrity: Goal: Risk for impaired skin integrity will decrease Outcome: Not Progressing

## 2023-03-29 NOTE — Progress Notes (Signed)
 Physical Therapy Treatment Patient Details Name: [name]. MRN: [id number] DOB: 26-Sep-1970 Today's Date: 03/29/2023   History of Present Illness [name]  is a 53 y.o. male, with past medical history of recent right-sided BKA by Dr. Harden, CAD, CVA, left eye enucleation, paroxysmal A-fib not on anticoagulation due to ICH, gastroparesis, diabetes mellitus, not anymore on insulin  due to recurrent hypoglycemia, known right eye diabetic retinopathy and glaucoma, patient presents to ED secondary to dizziness, blurry vision and lightheadedness while in dialysis, patient report he was discharged from hospital to SNF, he was discharged there after 3 weeks, reports been home for last 10 days, but he is unable to take care of himself, too weak, wife works all the time, comes to the request placement.  -In ED MRA head with no acute findings, no significant lab abnormalities, but he was noted to be extremely frail, had low CBGs initially which improved with fluid intake, he was seen by PT recommended SNF placement while in ED, so [facility] hospitalist consulted to admit to assist with placement.    PT Comments  Patient agreeable and motivated for therapy.  Patient demonstrates slow labored movement for sitting up at bedside, once seated demonstrates fair/good return for scooting forward and laterally at bedside with verbal cueing, able to lift bottom off bed during attempts to sit to stand, but unable to fully extend left knee due to weakness and required Max assist stand pivot to transfer to chair. Patient tolerated sitting up in chair after therapy - RN notified. Patient will benefit from continued skilled physical therapy in hospital and recommended venue below to increase strength, balance, endurance for safe ADLs and gait.       If plan is discharge home, recommend the following: A lot of help with bathing/dressing/bathroom;A lot of help with walking and/or transfers;Assistance with  cooking/housework;Help with stairs or ramp for entrance   Can travel by private vehicle     No  Equipment Recommendations  None recommended by PT    Recommendations for Other Services       Precautions / Restrictions Precautions Precautions: Fall Restrictions Weight Bearing Restrictions Per Provider Order: No     Mobility  Bed Mobility Overal bed mobility: Needs Assistance Bed Mobility: Supine to Sit     Supine to sit: Mod assist     General bed mobility comments: increased time, labored movement with HOB flat    Transfers Overall transfer level: Needs assistance Equipment used: 1 person hand held assist Transfers: Sit to/from Stand, Bed to chair/wheelchair/BSC Sit to Stand: Max assist Stand pivot transfers: Mod assist         General transfer comment: Patient unable to fully extend LLE due to weakness,    Ambulation/Gait                   Stairs             Wheelchair Mobility     Tilt Bed    Modified Rankin (Stroke Patients Only)       Balance Overall balance assessment: Needs assistance Sitting-balance support: Feet supported, No upper extremity supported Sitting balance-Leahy Scale: Fair Sitting balance - Comments: fair/good seated at EOB     Standing balance-Leahy Scale: Zero Standing balance comment: poor/zero using RW                            Cognition Arousal: Alert Behavior During Therapy: WFL for tasks assessed/performed Overall Cognitive  Status: Within Functional Limits for tasks assessed                                          Exercises General Exercises - Lower Extremity Long Arc Quad: Seated, AROM, Strengthening, Both, 10 reps Hip Flexion/Marching: Seated, AROM, Strengthening, 10 reps, Both Toe Raises: Seated, AROM, Strengthening, Left, 10 reps Heel Raises: Seated, AROM, Strengthening, Left, 10 reps    General Comments        Pertinent Vitals/Pain Pain Assessment Pain  Assessment: Faces Faces Pain Scale: Hurts even more Pain Location: right stump with pressure Pain Descriptors / Indicators: Grimacing, Guarding, Sharp Pain Intervention(s): Limited activity within patient's tolerance, Monitored during session, Repositioned    Home Living                          Prior Function            PT Goals (current goals can now be found in the care plan section) Acute Rehab PT Goals Patient Stated Goal: return home after rehab PT Goal Formulation: With patient Time For Goal Achievement: 04/07/23 Potential to Achieve Goals: Good Progress towards PT goals: Progressing toward goals    Frequency    Min 3X/week      PT Plan      Co-evaluation              AM-PAC PT 6 Clicks Mobility   Outcome Measure  Help needed turning from your back to your side while in a flat bed without using bedrails?: A Little Help needed moving from lying on your back to sitting on the side of a flat bed without using bedrails?: A Lot Help needed moving to and from a bed to a chair (including a wheelchair)?: A Lot Help needed standing up from a chair using your arms (e.g., wheelchair or bedside chair)?: A Lot Help needed to walk in hospital room?: Total Help needed climbing 3-5 steps with a railing? : Total 6 Click Score: 11    End of Session   Activity Tolerance: Patient tolerated treatment well;Patient limited by fatigue;Patient limited by pain Patient left: in chair;with call bell/phone within reach Nurse Communication: Mobility status PT Visit Diagnosis: Unsteadiness on feet (R26.81);Other abnormalities of gait and mobility (R26.89);Muscle weakness (generalized) (M62.81)     Time: 9049-8983 PT Time Calculation (min) (ACUTE ONLY): 26 min  Charges:    $Therapeutic Exercise: 8-22 mins $Therapeutic Activity: 8-22 mins PT General Charges $$ ACUTE PT VISIT: 1 Visit                     10:33 AM, 03/29/23 Lynwood Music, MPT Physical Therapist  with [facility] 336 [phone] office [phone] mobile phone

## 2023-03-29 NOTE — Progress Notes (Signed)
 PROGRESS NOTE    [name].  FMW:[id number] DOB: [birth year] DOA: 03/24/2023 PCP: [name], FNP    Brief Narrative:   [name]. is a 53 y.o. male with past medical history significant for ESRD on HD TTS, recent right BKA (Dr. Harden), CAD, CVA, paroxysmal atrial fibrillation not on anticoagulation due to history of ICH, gastroparesis, type 2 diabetes mellitus, diabetic retinopathy, glaucoma, history of left eye enucleation who presented to [name] ED on 1/30 via EMS from dialysis unit with complaints of increased blurriness, weakness, cough and difficulty managing at home.  Patient states symptoms worse with dialysis.  Recently discharged from the hospital to SNF for 3 weeks and has been home over the last 10 days.  Patient reports is unable to take care of himself due to weakness and spouse working all the time.  Requesting placement.  In the ED, temperature 97.7 F, HR 64, RR 10, BP 147/79, SpO2 100% on room air.  WBC 5.3, hemoglobin 7.5, platelet count 167.  Sodium 139, potassium 4.0, chloride 104, CO2 23, glucose 76, BUN 43, creatinine 7.80.  AST 13, ALT 14, total bilirubin 0.7.  COVID/influenza/RSV PCR negative.  MR angiogram head without contrast with no intracranial large vessel occlusion or significant stenosis.  [name] discussed with ophthalmology on-call, Dr. [name] regarding his blurred vision in the setting of known diabetic retinopathy, glaucoma and history of left eye enucleation; ophthalmology recommended no other workup at this time and vision to be monitored and outpatient follow-up with his retina specialist.  [name] consulted for admission for further evaluation management of progressive weakness with TOC involvement for placement.  Assessment & Plan:   Generalized weakness/deconditioning/gait disturbance Patient presenting to the ED from his HD unit with dizziness, progressive weakness and difficulty managing at home.  Recent prolonged hospitalization followed  by subacute rehab after right BKA by Dr Harden on 02/09/2023.  Prior to surgical invention patient was independent at baseline but has suffered his significant decline since. -- Continue PT/OT efforts while inpatient -- TOC consulted for SNF placement; insurance denied SNF authorization, patient will have to appeal  History of gangrene right foot s/p BKA Patient with recent BKA February 09, 2023 by Dr. Harden.  Recently seen in outpatient office visit 03/22/2023. -- Cleanse stump wound and left heel wounds with Vahse, paint both areas with betadine , top with foam  -- Apply Medihoney to the stump wound, top with saline moist gauze, and kerlix with ACE wrap.  -- Outpatient follow-up with orthopedics  ESRD on HD TTS Currently dialyzes at [facility]. -- Nephrology consulted for continued HD while inpatient; pending HD today  Paroxysmal atrial fibrillation -- Amiodarone  200 mg p.o. daily -- Carvedilol  12.5 mg p.o. twice daily -- Not on anticoagulation due to history of ICH  Type 2 diabetes mellitus Hemoglobin A1c 4.5% on 11/21/2021.  Diet controlled at baseline. -- continue intermittent monitoring of glucose  Diabetic retinopathy, glaucoma and history of left eye enucleation Patient complaining of dizziness, known diabetic retinopathy, left eye enucleation follows with retina specialist outpatient.  [name] discussed with ophthalmology on-call, Dr. [name] regarding his blurred vision with no further recommendations other than outpatient follow-up with his retina specialist.  MRA brain with no large vessel occlusion or significant stenosis. -- Outpatient follow-up with ophthalmology  Seizure disorder -- Keppra  500 mg daily on Sunday/Tuesday/Thursday/Saturday -- Keppra  750 mg p.o. daily on Monday/Wednesday/Friday -- Outpatient follow-up with neurology  Anemia of chronic medical/renal disease -- Hemoglobin 7.8, MCV 75 -- Check  anemia panel -- Transfuse for hemoglobin less than  7.0  Autonomic dysfunction -- Midodrine  5 mg p.o. 3 times daily PRN  Chronic diarrhea -- Imodium  as needed   DVT prophylaxis: SCDs Start: 03/24/23 2107    Code Status: Full Code Family Communication: No family present at bedside this morning  Disposition Plan:  Level of care: Med-Surg Status is: Observation The patient remains OBS appropriate and will d/c before 2 midnights.    Consultants:  Nephrology  Procedures:  None  Antimicrobials:  None   Subjective: Patient seen examined bedside, resting calmly.  Lying in bed.  Watching TV.  Eager to work with therapy this morning.  No complaints, concerns or questions this morning.  Denies headache, no chest pain, no palpitations, no shortness of breath, no abdominal pain, no fever/chills/night sweats, no nausea/vomiting, no focal weakness, no fatigue, no paresthesia.  No acute events overnight per nursing staff.  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  Patient will have to appeal insurance denial.  Objective: Vitals:   03/28/23 1611 03/28/23 1925 03/28/23 2118 03/29/23 0239  BP: 121/74 130/74 130/74 126/72  Pulse: 64 63 63 65  Resp: 18 18  18   Temp: 98.1 F (36.7 C) 98.2 F (36.8 C)  98 F (36.7 C)  TempSrc: Oral Oral  Oral  SpO2: 99% 99%  97%  Weight:      Height:       No intake or output data in the 24 hours ending 03/29/23 1106  Filed Weights   03/24/23 0925 03/26/23 1810 03/26/23 2155  Weight: 79.4 kg 70.2 kg 69.3 kg    Examination:  Physical Exam: GEN: NAD, alert and oriented x 3, chronically ill in appearance, appears older than stated age HEENT: NCAT, noted enucleated left eye, MMM PULM: CTAB w/o wheezes/crackles, normal respiratory effort, on room air CV: RRR w/o M/G/R GI: abd soft, NTND, NABS, no R/G/M MSK: Right BKA site noted with Kerlix  dressing in place, clean/dry/intact NEURO: CN II-XII intact, no focal deficits, sensation to light touch intact PSYCH: normal mood/affect Integumentary: Right BKA site as above, otherwise no other concerning rashes/lesions/wounds noted on exposed skin surfaces.    Data Reviewed: I have personally reviewed following labs and imaging studies  CBC: Recent Labs  Lab 03/24/23 1034 03/25/23 0343 03/26/23 0526  WBC 5.3 5.4 7.2  NEUTROABS 2.7  --   --   HGB 7.5* 8.4* 7.8*  HCT 25.1* 29.0* 27.4*  MCV 75.1* 75.5* 75.5*  PLT 167 183 182   Basic Metabolic Panel: Recent Labs  Lab 03/24/23 1034 03/25/23 0343 03/26/23 0526  NA 139 141 137  K 4.0 4.6 6.0*  CL 104 108 106  CO2 23 20* 21*  GLUCOSE 76 81 70  BUN 43* 48* 70*  CREATININE 7.80* 9.25* 10.25*  CALCIUM  8.8* 9.1 8.8*  PHOS  --   --  6.4*   GFR: Estimated Creatinine Clearance: 8 mL/min (A) (by C-G formula based on SCr of 10.25 mg/dL (H)). Liver Function Tests: Recent Labs  Lab 03/24/23 1034 03/26/23 0526  AST 13*  --   ALT 14  --   ALKPHOS 62  --   BILITOT 0.7  --   PROT 6.4*  --   ALBUMIN  2.8* 2.9*   No results for input(s): LIPASE, AMYLASE in the last 168 hours. No results for input(s): AMMONIA in  the last 168 hours. Coagulation Profile: No results for input(s): INR, PROTIME in the last 168 hours. Cardiac Enzymes: No results for input(s): CKTOTAL, CKMB, CKMBINDEX, TROPONINI in the last 168 hours. BNP (last 3 results) No results for input(s): PROBNP in the last 8760 hours. HbA1C: No results for input(s): HGBA1C in the last 72 hours. CBG: Recent Labs  Lab 03/24/23 1029 03/24/23 1133 03/24/23 1541 03/24/23 2333 03/25/23 1558  GLUCAP 66* 63* 80 192* 91   Lipid Profile: No results for input(s): CHOL, HDL, LDLCALC, TRIG, CHOLHDL, LDLDIRECT in the last 72 hours. Thyroid  Function Tests: No results for input(s): TSH, T4TOTAL, FREET4, T3FREE, THYROIDAB in the last 72  hours. Anemia Panel: No results for input(s): VITAMINB12, FOLATE, FERRITIN, TIBC, IRON, RETICCTPCT in the last 72 hours.  Sepsis Labs: No results for input(s): PROCALCITON, LATICACIDVEN in the last 168 hours.  Recent Results (from the past 240 hours)  Resp panel by RT-PCR (RSV, Flu A&B, Covid) Anterior Nasal Swab     Status: None   Collection Time: 03/24/23 10:04 AM   Specimen: Anterior Nasal Swab  Result Value Ref Range Status   SARS Coronavirus 2 by RT PCR NEGATIVE NEGATIVE Final    Comment: (NOTE) SARS-CoV-2 target nucleic acids are NOT DETECTED.  The SARS-CoV-2 RNA is generally detectable in upper respiratory specimens during the acute phase of infection. The lowest concentration of SARS-CoV-2 viral copies this assay can detect is 138 copies/mL. A negative result does not preclude SARS-Cov-2 infection and should not be used as the sole basis for treatment or other patient management decisions. A negative result may occur with  improper specimen collection/handling, submission of specimen other than nasopharyngeal swab, presence of viral mutation(s) within the areas targeted by this assay, and inadequate number of viral copies(<138 copies/mL). A negative result must be combined with clinical observations, patient history, and epidemiological information. The expected result is Negative.  Fact Sheet for Patients:  bloggercourse.com  Fact Sheet for Healthcare Providers:  seriousbroker.it  This test is no t yet approved or cleared by the [place]  FDA and  has been authorized for detection and/or diagnosis of SARS-CoV-2 by FDA under an Emergency Use Authorization (EUA). This EUA will remain  in effect (meaning this test can be used) for the duration of the COVID-19 declaration under Section 564(b)(1) of the Act, 21 U.S.C.section 360bbb-3(b)(1), unless the authorization is terminated  or revoked sooner.        Influenza A by PCR NEGATIVE NEGATIVE Final   Influenza B by PCR NEGATIVE NEGATIVE Final    Comment: (NOTE) The Xpert Xpress SARS-CoV-2/FLU/RSV plus assay is intended as an aid in the diagnosis of influenza from Nasopharyngeal swab specimens and should not be used as a sole basis for treatment. Nasal washings and aspirates are unacceptable for Xpert Xpress SARS-CoV-2/FLU/RSV testing.  Fact Sheet for Patients: bloggercourse.com  Fact Sheet for Healthcare Providers: seriousbroker.it  This test is not yet approved or cleared by the [place]  FDA and has been authorized for detection and/or diagnosis of SARS-CoV-2 by FDA under an Emergency Use Authorization (EUA). This EUA will remain in effect (meaning this test can be used) for the duration of the COVID-19 declaration under Section 564(b)(1) of the Act, 21 U.S.C. section 360bbb-3(b)(1), unless the authorization is terminated or revoked.     Resp Syncytial Virus by PCR NEGATIVE NEGATIVE Final    Comment: (NOTE) Fact Sheet for Patients: bloggercourse.com  Fact Sheet for Healthcare Providers: seriousbroker.it  This test is not yet approved or cleared  by the [place]  FDA and has been authorized for detection and/or diagnosis of SARS-CoV-2 by FDA under an Emergency Use Authorization (EUA). This EUA will remain in effect (meaning this test can be used) for the duration of the COVID-19 declaration under Section 564(b)(1) of the Act, 21 U.S.C. section 360bbb-3(b)(1), unless the authorization is terminated or revoked.  Performed at [facility], [address], [place], [place] [address]   Gastrointestinal Panel by PCR , Stool     Status: None   Collection Time: 03/25/23  3:49 AM   Specimen: Stool  Result Value Ref Range Status   Campylobacter species NOT DETECTED NOT DETECTED Final   Plesimonas shigelloides NOT  DETECTED NOT DETECTED Final   Salmonella species NOT DETECTED NOT DETECTED Final   Yersinia enterocolitica NOT DETECTED NOT DETECTED Final   Vibrio species NOT DETECTED NOT DETECTED Final   Vibrio cholerae NOT DETECTED NOT DETECTED Final   Enteroaggregative E coli (EAEC) NOT DETECTED NOT DETECTED Final   Enteropathogenic E coli (EPEC) NOT DETECTED NOT DETECTED Final   Enterotoxigenic E coli (ETEC) NOT DETECTED NOT DETECTED Final   Shiga like toxin producing E coli (STEC) NOT DETECTED NOT DETECTED Final   Shigella/Enteroinvasive E coli (EIEC) NOT DETECTED NOT DETECTED Final   Cryptosporidium NOT DETECTED NOT DETECTED Final   Cyclospora cayetanensis NOT DETECTED NOT DETECTED Final   Entamoeba histolytica NOT DETECTED NOT DETECTED Final   Giardia lamblia NOT DETECTED NOT DETECTED Final   Adenovirus F40/41 NOT DETECTED NOT DETECTED Final   Astrovirus NOT DETECTED NOT DETECTED Final   Norovirus GI/GII NOT DETECTED NOT DETECTED Final   Rotavirus A NOT DETECTED NOT DETECTED Final   Sapovirus (I, II, IV, and V) NOT DETECTED NOT DETECTED Final    Comment: Performed at [facility], [address] [place], [place] [address]  C Difficile Quick Screen w PCR reflex     Status: None   Collection Time: 03/25/23  3:50 AM   Specimen: Stool  Result Value Ref Range Status   C Diff antigen NEGATIVE NEGATIVE Final   C Diff toxin NEGATIVE NEGATIVE Final   C Diff interpretation No C. difficile detected.  Final    Comment: Performed at [facility], [address], [place], [place] [address]         Radiology Studies: No results found.       Scheduled Meds:  (feeding supplement) PROSource Plus  30 mL Oral TID BM   amiodarone   200 mg Oral Daily   aspirin  EC  81 mg Oral Daily   atorvastatin   80 mg Oral QHS   calcitRIOL   2.25 mcg Oral Q T,Th,Sat-1800   calcium  acetate  1,334 mg Oral TID WC   carvedilol   12.5 mg Oral BID   Chlorhexidine  Gluconate Cloth  6 each Topical Q0600    cinacalcet   60 mg Oral Q T,Th,Sat-1800   darbepoetin (ARANESP ) injection - DIALYSIS  150 mcg Subcutaneous Q Tue-1800   dorzolamide -timolol   1 drop Right Eye BID   leptospermum manuka honey  1 Application Topical Daily   levETIRAcetam   500 mg Oral Once per day on Sunday Tuesday Thursday Saturday   levETIRAcetam   750 mg Oral Once per day on Monday Wednesday Friday   midodrine   5 mg Oral TID WC   multivitamin  1 tablet Oral QHS   nutrition supplement (JUVEN)  1 packet Oral BID BM   pneumococcal 20-valent conjugate vaccine  0.5 mL Intramuscular Tomorrow-1000   Continuous Infusions:   LOS:  0 days    Time spent: 45 minutes spent on chart review, discussion with nursing staff, consultants, updating family and interview/physical exam; more than 50% of that time was spent in counseling and/or coordination of care.    Camellia PARAS Braniya Farrugia, DO [facility] Available via Epic secure chat 7am-7pm After these hours, please refer to coverage provider listed on [email or web address] 03/29/2023, 11:06 AM

## 2023-03-29 NOTE — Plan of Care (Signed)

## 2023-03-29 NOTE — TOC Progression Note (Signed)
 Transition of Care (TOC) - Progression Note    Patient Details  Name: [name]. MRN: [id number] Date of Birth: [birth year]  Transition of Care [facility]) CM/SW Contact  Rollo Petri, LCSW Phone Number: 03/29/2023, 1:25 PM  Clinical Narrative:     TOC following. Attending MD completed a peer to peer review with insurance MD today. Insurance MD then denied pt's SNF authorization.  Reviewed with pt's wife and also with pt. Pt requested TOC assistance with appeal process of that denial. Called the [employer] appeal line and filed the appeal.  Pt and his wife were given the private pay rates for care at [facility]. Encouraged pt's wife to go to [facility] Social Services to apply for Disability Medicaid for pt. She stated she would do this.   [name] states that if appeal denial is upheld, she will have to take pt home. She will need hospital bed, trapeze bar, and Hoyer lift ordered through [employer]. Wife previously purchased a hospital bed and lift from [employer] and she states that the items are not functional enough for pt. She also states that they were not able to arrange [facility] services for pt previously due to options where they live and the fact that pt's wife not home during the day.  Provided pt's wife with information on private pay caregiver options as well as [employer] phone number for transportation needs for pt's outpatient HD appointments.  TOC will follow.      Barriers to Discharge: Insurance Authorization  Expected Discharge Plan and Services                                               Social Determinants of Health (SDOH) Interventions SDOH Screenings   Food Insecurity: No Food Insecurity (03/25/2023)  Housing: Low Risk  (03/25/2023)  Transportation Needs: No Transportation Needs (03/25/2023)  Utilities: Not At Risk (03/25/2023)  Financial Resource Strain: High Risk (07/05/2017)  Physical Activity: Inactive (07/05/2017)  Social  Connections: Moderately Integrated (07/05/2017)  Stress: Stress Concern Present (07/05/2017)  Tobacco Use: Low Risk  (03/25/2023)  Health Literacy: Low Risk  (06/01/2020)   Received from [facility], [facility]    Readmission Risk Interventions    07/27/2021   11:49 AM 11/27/2020    3:06 PM  Readmission Risk Prevention Plan  Transportation Screening Complete Complete  PCP or Specialist Appt within 3-5 Days  Complete  HRI or Home Care Consult  Complete  Social Work Consult for Recovery Care Planning/Counseling  Complete  Palliative Care Screening  Not Applicable  Medication Review Oceanographer) Complete Complete  SW Recovery Care/Counseling Consult Complete   Palliative Care Screening Not Applicable   Skilled Nursing Facility Not Applicable

## 2023-03-29 NOTE — Plan of Care (Signed)
  Problem: Education: Goal: Knowledge of General Education information will improve Description: Including pain rating scale, medication(s)/side effects and non-pharmacologic comfort measures Outcome: Not Progressing   Problem: Activity: Goal: Risk for activity intolerance will decrease Outcome: Not Progressing   Problem: Pain Managment: Goal: General experience of comfort will improve and/or be controlled Outcome: Progressing   Problem: Safety: Goal: Ability to remain free from injury will improve Outcome: Progressing

## 2023-03-29 NOTE — Progress Notes (Signed)
   HEMODIALYSIS TREATMENT NOTE:  3 hour heparin -free treatment completed.  Pt announced at commencement of HD, 3:15.  BP began declining after 2.75 hours of therapy and UFR was lowered.  Pt then asked to end session after 3 hours.  All blood was returned.  Net UF 500 ml.  Post-HD:  03/29/23 1520  Vital Signs  Temp 97.9 F (36.6 C)  Temp Source Oral  Pulse Rate 61  Pulse Rate Source Monitor  Resp 18  BP 111/61  BP Location Right Arm  BP Method Automatic  Patient Position (if appropriate) Lying  Oxygen Therapy  SpO2 100 %  O2 Device Room Air  Pain Assessment  Pain Score 0  Dialysis Weight  Weight 72.1 kg  Type of Weight Post-Dialysis  Post Treatment  Dialyzer Clearance Lightly streaked  Hemodialysis Intake (mL) 300 mL  Liters Processed 70  Fluid Removed (mL) 500 mL  Tolerated HD Treatment No (Comment)  Post-Hemodialysis Comments Signed off early / AMA with 25m RTD  AVG/AVF Arterial Site Held (minutes) 7 minutes  AVG/AVF Venous Site Held (minutes) 7 minutes  Fistula / Graft Left Upper arm  No placement date or time found.   Orientation: Left  Access Location: Upper arm  Fistula / Graft Assessment Thrill;Bruit  Status Patent    Jon Laos, RN AP [facility]

## 2023-03-30 ENCOUNTER — Telehealth: Payer: Self-pay | Admitting: Orthopedic Surgery

## 2023-03-30 DIAGNOSIS — R531 Weakness: Secondary | ICD-10-CM | POA: Diagnosis not present

## 2023-03-30 MED ORDER — SODIUM ZIRCONIUM CYCLOSILICATE 10 G PO PACK
10.0000 g | PACK | Freq: Once | ORAL | Status: AC
  Administered 2023-03-30: 10 g via ORAL
  Filled 2023-03-30: qty 1

## 2023-03-30 NOTE — Progress Notes (Signed)
 Patient ID: [name] Daune Raddle., male   DOB: 01/03/1971, 53 y.o.   MRN: [id number] S: No new complaints and reports that HD went well yesterday.  O:BP (!) 158/81 (BP Location: Right Arm)   Pulse 69   Temp 97.8 F (36.6 C) (Oral)   Resp 18   Ht 6' 3 (1.905 m)   Wt 72.1 kg   SpO2 100%   BMI 19.87 kg/m   Intake/Output Summary (Last 24 hours) at 03/30/2023 1059 Last data filed at 03/29/2023 1520 Gross per 24 hour  Intake --  Output 500 ml  Net -500 ml   Intake/Output: I/O last 3 completed shifts: In: -  Out: 500 [Other:500]  Intake/Output this shift:  No intake/output data recorded. Weight change:  Gen:NAD CVS: RRR  Resp:CTA Abd: +BS, soft, NT/ND Ext: no edema, s/p RBKA, lue avg +T/B  Recent Labs  Lab 03/24/23 1034 03/25/23 0343 03/26/23 0526 03/29/23 1211  NA 139 141 137 135  K 4.0 4.6 6.0* 6.0*  CL 104 108 106 99  CO2 23 20* 21* 22  GLUCOSE 76 81 70 75  BUN 43* 48* 70* 89*  CREATININE 7.80* 9.25* 10.25* 10.54*  ALBUMIN  2.8*  --  2.9* 2.7*  CALCIUM  8.8* 9.1 8.8* 9.0  PHOS  --   --  6.4* 7.1*  AST 13*  --   --   --   ALT 14  --   --   --    Liver Function Tests: Recent Labs  Lab 03/24/23 1034 03/26/23 0526 03/29/23 1211  AST 13*  --   --   ALT 14  --   --   ALKPHOS 62  --   --   BILITOT 0.7  --   --   PROT 6.4*  --   --   ALBUMIN  2.8* 2.9* 2.7*   No results for input(s): LIPASE, AMYLASE in the last 168 hours. No results for input(s): AMMONIA in the last 168 hours. CBC: Recent Labs  Lab 03/24/23 1034 03/25/23 0343 03/26/23 0526 03/29/23 1211  WBC 5.3 5.4 7.2 6.0  NEUTROABS 2.7  --   --   --   HGB 7.5* 8.4* 7.8* 7.7*  HCT 25.1* 29.0* 27.4* 25.4*  MCV 75.1* 75.5* 75.5* 75.1*  PLT 167 183 182 181   Cardiac Enzymes: No results for input(s): CKTOTAL, CKMB, CKMBINDEX, TROPONINI in the last 168 hours. CBG: Recent Labs  Lab 03/24/23 1029 03/24/23 1133 03/24/23 1541 03/24/23 2333 03/25/23 1558  GLUCAP 66* 63* 80 192* 91    Iron  Studies: No results for input(s): IRON, TIBC, TRANSFERRIN, FERRITIN in the last 72 hours. Studies/Results: No results found.  (feeding supplement) PROSource Plus  30 mL Oral TID BM   amiodarone   200 mg Oral Daily   aspirin  EC  81 mg Oral Daily   atorvastatin   80 mg Oral QHS   calcitRIOL   2.25 mcg Oral Q T,Th,Sat-1800   calcium  acetate  1,334 mg Oral TID WC   carvedilol   12.5 mg Oral BID   Chlorhexidine  Gluconate Cloth  6 each Topical Q0600   cinacalcet   60 mg Oral Q T,Th,Sat-1800   darbepoetin (ARANESP ) injection - DIALYSIS  150 mcg Subcutaneous Q Tue-1800   dorzolamide -timolol   1 drop Right Eye BID   leptospermum manuka honey  1 Application Topical Daily   levETIRAcetam   500 mg Oral Once per day on Sunday Tuesday Thursday Saturday   levETIRAcetam   750 mg Oral Once per day on Monday Wednesday Friday   midodrine   5 mg Oral TID WC   multivitamin  1 tablet Oral QHS   nutrition supplement (JUVEN)  1 packet Oral BID BM   pneumococcal 20-valent conjugate vaccine  0.5 mL Intramuscular Tomorrow-1000    BMET    Component Value Date/Time   NA 135 03/29/2023 1211   K 6.0 (H) 03/29/2023 1211   CL 99 03/29/2023 1211   CO2 22 03/29/2023 1211   GLUCOSE 75 03/29/2023 1211   BUN 89 (H) 03/29/2023 1211   CREATININE 10.54 (H) 03/29/2023 1211   CALCIUM  9.0 03/29/2023 1211   GFRNONAA 5 (L) 03/29/2023 1211   GFRAA 5 (L) 08/27/2019 1540   CBC    Component Value Date/Time   WBC 6.0 03/29/2023 1211   RBC 3.38 (L) 03/29/2023 1211   HGB 7.7 (L) 03/29/2023 1211   HCT 25.4 (L) 03/29/2023 1211   PLT 181 03/29/2023 1211   MCV 75.1 (L) 03/29/2023 1211   MCH 22.8 (L) 03/29/2023 1211   MCHC 30.3 03/29/2023 1211   RDW 21.2 (H) 03/29/2023 1211   LYMPHSABS 1.6 03/24/2023 1034   MONOABS 0.5 03/24/2023 1034   EOSABS 0.4 03/24/2023 1034   BASOSABS 0.1 03/24/2023 1034     Outpatient HD orders:  [facility]  TTS  3 hrs and 15 minutes EDW 72 kg but being assessed after amputation and  hospitalization  BF 400  DF 800  LUE AVF  2K/2Ca Last post weight was 71.7 kg on on 1/30 Medications - they have not gotten labs yet at [facility] so he hasn't gotten ESA restarted or any other scheduled in-center meds.  He came back to [facility] on 03/17/23 Heparin  400 units/hr and a loading dose of 1000 units     Assessment/Plan:   # ESRD  - HD per TTS schedule  - He has most recently been at [facility] but this may need to change depending on SNF placement    # Atrial fibrillation  - agents per primary team - he is on amio and coreg  - note that midodrine  is ordered for support to allow these agents as well    # Anemia of CKD - No ESA given yet at [facility] per outpatient RN - she states that he has been there since 03/17/23  - ESA can be given at least 03/29/23 - will order in the event that he is inpatient.  (Aranesp  150 mcg every Tuesday is ordered)   # Metabolic bone disease - on sensipar  and calcitriol  per prior regimen currently - calcium  acceptable   - renal panel in AM   # Weakness - Patient/family unable to care for him at home and SNF has been recommended    Disposition per primary team.  Per charting awaiting SNF.  Note that once his SNF is confirmed will need to ensure that he has a HD unit nearby  Fairy RONAL Sellar, MD [facility]

## 2023-03-30 NOTE — Progress Notes (Signed)
 Physical Therapy Treatment Patient Details Name: [name]. MRN: [id number] DOB: 16-Mar-1970 Today's Date: 03/30/2023   History of Present Illness [name]  is a 53 y.o. male, with past medical history of recent right-sided BKA by Dr. Harden, CAD, CVA, left eye enucleation, paroxysmal A-fib not on anticoagulation due to ICH, gastroparesis, diabetes mellitus, not anymore on insulin  due to recurrent hypoglycemia, known right eye diabetic retinopathy and glaucoma, patient presents to ED secondary to dizziness, blurry vision and lightheadedness while in dialysis, patient report he was discharged from hospital to SNF, he was discharged there after 3 weeks, reports been home for last 10 days, but he is unable to take care of himself, too weak, wife works all the time, comes to the request placement.  -In ED MRA head with no acute findings, no significant lab abnormalities, but he was noted to be extremely frail, had low CBGs initially which improved with fluid intake, he was seen by PT recommended SNF placement while in ED, so [facility] hospitalist consulted to admit to assist with placement.    PT Comments  Patient demonstrates increased endurance/tolerance for standing with RW while having left knee partially blocked and able to complete a couple of shuffling side steps during transfer to chair.  Patient tolerated sitting up in chair after therapy. Patient will benefit from continued skilled physical therapy in hospital and recommended venue below to increase strength, balance, endurance for safe ADLs and gait.      If plan is discharge home, recommend the following: A lot of help with bathing/dressing/bathroom;A lot of help with walking and/or transfers;Assistance with cooking/housework;Help with stairs or ramp for entrance   Can travel by private vehicle     No  Equipment Recommendations  None recommended by PT    Recommendations for Other Services       Precautions / Restrictions  Precautions Precautions: Fall Restrictions Weight Bearing Restrictions Per Provider Order: No     Mobility  Bed Mobility Overal bed mobility: Needs Assistance Bed Mobility: Supine to Sit     Supine to sit: Min assist, HOB elevated     General bed mobility comments: increase time with labored movment, fair/good return for scooting to EOB    Transfers Overall transfer level: Needs assistance Equipment used: Rolling walker (2 wheels) Transfers: Sit to/from Stand, Bed to chair/wheelchair/BSC Sit to Stand: Max assist   Step pivot transfers: Mod assist, Max assist       General transfer comment: tolerated standing with RW with left knee partially blocked, good return for supporting self with BUE    Ambulation/Gait Ambulation/Gait assistance: Max assist Gait Distance (Feet): 2 Feet Assistive device: Rolling walker (2 wheels) Gait Pattern/deviations: Decreased step length - right, Decreased step length - left, Decreased stride length, Shuffle Gait velocity: slow     General Gait Details: lmited to a couple fo shuffling side steps on LLE during transfer to chair   Stairs             Wheelchair Mobility     Tilt Bed    Modified Rankin (Stroke Patients Only)       Balance Overall balance assessment: Needs assistance Sitting-balance support: Feet supported, No upper extremity supported Sitting balance-Leahy Scale: Fair Sitting balance - Comments: fair/good seated at EOB   Standing balance support: Bilateral upper extremity supported, During functional activity, Reliant on assistive device for balance Standing balance-Leahy Scale: Poor Standing balance comment: using RW  Cognition Arousal: Alert Behavior During Therapy: WFL for tasks assessed/performed Overall Cognitive Status: Within Functional Limits for tasks assessed                                          Exercises      General Comments         Pertinent Vitals/Pain Pain Assessment Pain Assessment: Faces Faces Pain Scale: Hurts a little bit Pain Location: R stump Pain Descriptors / Indicators: Discomfort, Sore Pain Intervention(s): Limited activity within patient's tolerance, Monitored during session, Repositioned    Home Living                          Prior Function            PT Goals (current goals can now be found in the care plan section) Acute Rehab PT Goals Patient Stated Goal: return home after rehab PT Goal Formulation: With patient Time For Goal Achievement: 04/07/23 Potential to Achieve Goals: Good    Frequency    Min 3X/week      PT Plan      Co-evaluation PT/OT/SLP Co-Evaluation/Treatment: Yes Reason for Co-Treatment: To address functional/ADL transfers   OT goals addressed during session: ADL's and self-care      AM-PAC PT 6 Clicks Mobility   Outcome Measure  Help needed turning from your back to your side while in a flat bed without using bedrails?: A Little Help needed moving from lying on your back to sitting on the side of a flat bed without using bedrails?: A Little Help needed moving to and from a bed to a chair (including a wheelchair)?: A Lot Help needed standing up from a chair using your arms (e.g., wheelchair or bedside chair)?: A Lot Help needed to walk in hospital room?: A Lot Help needed climbing 3-5 steps with a railing? : Total 6 Click Score: 13    End of Session Equipment Utilized During Treatment: Gait belt Activity Tolerance: Patient tolerated treatment well;Patient limited by fatigue;Patient limited by pain Patient left: in chair;with call bell/phone within reach Nurse Communication: Mobility status PT Visit Diagnosis: Unsteadiness on feet (R26.81);Other abnormalities of gait and mobility (R26.89);Muscle weakness (generalized) (M62.81)     Time: 9179-9153 PT Time Calculation (min) (ACUTE ONLY): 26 min  Charges:    $Therapeutic Activity:  23-37 mins PT General Charges $$ ACUTE PT VISIT: 1 Visit                     10:33 AM, 03/30/23 Lynwood Music, MPT Physical Therapist with [facility] [phone] office [phone] mobile phone

## 2023-03-30 NOTE — Telephone Encounter (Signed)
 Patient called. He is in the hospital. He says he can not go home because he can not take care of himself. He would like a RX for home health aid and PT. His cb# [phone]

## 2023-03-30 NOTE — Progress Notes (Signed)
 Occupational Therapy Treatment Patient Details Name: [name]. MRN: [id number] DOB: 02-24-1970 Today's Date: 03/30/2023   History of present illness [name]  is a 53 y.o. male, with past medical history of recent right-sided BKA by Dr. Harden, CAD, CVA, left eye enucleation, paroxysmal A-fib not on anticoagulation due to ICH, gastroparesis, diabetes mellitus, not anymore on insulin  due to recurrent hypoglycemia, known right eye diabetic retinopathy and glaucoma, patient presents to ED secondary to dizziness, blurry vision and lightheadedness while in dialysis, patient report he was discharged from hospital to SNF, he was discharged there after 3 weeks, reports been home for last 10 days, but he is unable to take care of himself, too weak, wife works all the time, comes to the request placement.  -In ED MRA head with no acute findings, no significant lab abnormalities, but he was noted to be extremely frail, had low CBGs initially which improved with fluid intake, he was seen by PT recommended SNF placement while in ED, so [facility] hospitalist consulted to admit to assist with placement.   OT comments  Pt agreeable to OT and PT co-treatment. Pt able to improve mobility with min A for bed mobility and mod to max for transfers/standing. Max A still needed for lower body dressing. Able to use B UE functionally on the RW for transfers. Pt able to stand with assist for 1 minute and 17 seconds with the RW today. Pt left in the chair with call bell within reach. Pt will benefit from continued OT in the hospital and recommended venue below to increase strength, balance, and endurance for safe ADL's.         If plan is discharge home, recommend the following:  A lot of help with walking and/or transfers;A lot of help with bathing/dressing/bathroom;Assistance with cooking/housework;Assist for transportation;Help with stairs or ramp for entrance   Equipment Recommendations  None recommended by OT           Precautions / Restrictions Precautions Precautions: Fall Restrictions Weight Bearing Restrictions Per Provider Order: No       Mobility Bed Mobility Overal bed mobility: Needs Assistance Bed Mobility: Supine to Sit     Supine to sit: Min assist, HOB elevated     General bed mobility comments: labored movement; increased time; assist to pull to sit    Transfers Overall transfer level: Needs assistance Equipment used: Rolling walker (2 wheels) Transfers: Sit to/from Stand, Bed to chair/wheelchair/BSC Sit to Stand: Max assist     Step pivot transfers: Mod assist, Max assist     General transfer comment: Able to stand fr 1 minute and 17 seconds consecuitively with RW and max A. Pt also able to complete transfer to chair with slgiht L LE shuffle to get to the chair using the RW.     Balance Overall balance assessment: Needs assistance Sitting-balance support: No upper extremity supported, Feet supported Sitting balance-Leahy Scale: Fair Sitting balance - Comments: fair/good seated at EOB   Standing balance support: Bilateral upper extremity supported, During functional activity, Reliant on assistive device for balance Standing balance-Leahy Scale: Poor Standing balance comment: using RW                           ADL either performed or assessed with clinical judgement   ADL Overall ADL's : Needs assistance/impaired                     Lower Body Dressing:  Maximal assistance;Sitting/lateral leans Lower Body Dressing Details (indicate cue type and reason): Pt attemtped to don L LE sock with much effort and time, but assist still needed. Attempted seated in recliner. Toilet Transfer: Maximal assistance;Stand-pivot;Rolling walker (2 wheels) Toilet Transfer Details (indicate cue type and reason): Simulated via EOB to chair transfer with RW.                Extremity/Trunk Assessment Upper Extremity Assessment Upper Extremity  Assessment: RUE deficits/detail;LUE deficits/detail RUE Deficits / Details: 3-/5 MMT shoulder flexion; able to functionally use RW. LUE Deficits / Details: Limited to 2+/5 MMT for shoulder flexion.   Lower Extremity Assessment Lower Extremity Assessment: Defer to PT evaluation                          Cognition Arousal: Alert Behavior During Therapy: WFL for tasks assessed/performed Overall Cognitive Status: Within Functional Limits for tasks assessed                                                             Pertinent Vitals/ Pain       Pain Assessment Pain Assessment: Faces Faces Pain Scale: Hurts a little bit Pain Location: R stump Pain Descriptors / Indicators: Discomfort Pain Intervention(s): Limited activity within patient's tolerance, Monitored during session, Repositioned                                                          Frequency  Min 2X/week        Progress Toward Goals  OT Goals(current goals can now be found in the care plan section)  Progress towards OT goals: Progressing toward goals  Acute Rehab OT Goals Patient Stated Goal: get stronger OT Goal Formulation: With patient Time For Goal Achievement: 04/08/23 Potential to Achieve Goals: Fair ADL Goals Pt Will Perform Grooming: with modified independence;sitting Pt Will Perform Upper Body Dressing: with modified independence;sitting Pt Will Perform Lower Body Dressing: with contact guard assist;sitting/lateral leans Pt Will Transfer to Toilet: with contact guard assist;with min assist;stand pivot transfer Pt Will Perform Toileting - Clothing Manipulation and hygiene: with contact guard assist;with supervision;sitting/lateral leans Pt/caregiver will Perform Home Exercise Program: Increased ROM;Increased strength;Both right and left upper extremity;Independently  Plan      Co-evaluation    PT/OT/SLP Co-Evaluation/Treatment: Yes Reason  for Co-Treatment: To address functional/ADL transfers   OT goals addressed during session: ADL's and self-care                          End of Session Equipment Utilized During Treatment: Rolling walker (2 wheels)  OT Visit Diagnosis: Unsteadiness on feet (R26.81);Other abnormalities of gait and mobility (R26.89);Muscle weakness (generalized) (M62.81)   Activity Tolerance Patient tolerated treatment well   Patient Left in chair;with call bell/phone within reach             Time: 0828-0849 OT Time Calculation (min): 21 min  Charges: OT General Charges $OT Visit: 1 Visit  [facility] OT, MOT   Jayson Person 03/30/2023, 10:08 AM

## 2023-03-30 NOTE — Progress Notes (Signed)
 PROGRESS NOTE    [name].  FMW:[id number] DOB: 09/15/1970 DOA: 03/24/2023 PCP: [name], FNP    Brief Narrative:   [name]. is a 53 y.o. male with past medical history significant for ESRD on HD TTS, recent right BKA (Dr. Harden), CAD, CVA, paroxysmal atrial fibrillation not on anticoagulation due to history of ICH, gastroparesis, type 2 diabetes mellitus, diabetic retinopathy, glaucoma, history of left eye enucleation who presented to [name] on 1/30 via EMS from dialysis unit with complaints of increased blurriness, weakness, cough and difficulty managing at home.  Patient states symptoms worse with dialysis.  Recently discharged from the hospital to SNF for 3 weeks and has been home over the last 10 days.  Patient reports is unable to take care of himself due to weakness and spouse working all the time.  Requesting placement.  In the ED, temperature 97.7 F, HR 64, RR 10, BP 147/79, SpO2 100% on room air.  WBC 5.3, hemoglobin 7.5, platelet count 167.  Sodium 139, potassium 4.0, chloride 104, CO2 23, glucose 76, BUN 43, creatinine 7.80.  AST 13, ALT 14, total bilirubin 0.7.  COVID/influenza/RSV PCR negative.  MR angiogram head without contrast with no intracranial large vessel occlusion or significant stenosis.  [name] discussed with ophthalmology on-call, Dr. [name] regarding his blurred vision in the setting of known diabetic retinopathy, glaucoma and history of left eye enucleation; ophthalmology recommended no other workup at this time and vision to be monitored and outpatient follow-up with his retina specialist.  [name] consulted for admission for further evaluation management of progressive weakness with TOC involvement for placement.  Assessment & Plan:   Generalized weakness/deconditioning/gait disturbance Patient presenting to the ED from his HD unit with dizziness, progressive weakness and difficulty managing at home.  Recent prolonged hospitalization followed  by subacute rehab after right BKA by Dr Harden on 02/09/2023.  Prior to surgical invention patient was independent at baseline but has suffered his significant decline since. -- Continue PT/OT efforts while inpatient -- TOC consulted for SNF placement; insurance denied SNF authorization, pending appeal  History of gangrene right foot s/p BKA Patient with recent BKA February 09, 2023 by Dr. Harden.  Recently seen in outpatient office visit 03/22/2023. -- Cleanse stump wound and left heel wounds with Vahse, paint both areas with betadine , top with foam  -- Apply Medihoney to the stump wound, top with saline moist gauze, and kerlix with ACE wrap.  -- Outpatient follow-up with orthopedics  ESRD on HD TTS Currently dialyzes at [facility]. -- Nephrology consulted for continued HD while inpatient; HD yesterday  Paroxysmal atrial fibrillation -- Amiodarone  200 mg p.o. daily -- Carvedilol  12.5 mg p.o. twice daily -- Not on anticoagulation due to history of ICH  Type 2 diabetes mellitus Hemoglobin A1c 4.5% on 11/21/2021.  Diet controlled at baseline. -- continue intermittent monitoring of glucose  Diabetic retinopathy, glaucoma and history of left eye enucleation Patient complaining of dizziness, known diabetic retinopathy, left eye enucleation follows with retina specialist outpatient.  [name] discussed with ophthalmology on-call, Dr. [name] regarding his blurred vision with no further recommendations other than outpatient follow-up with his retina specialist.  MRA brain with no large vessel occlusion or significant stenosis. -- Outpatient follow-up with ophthalmology  Seizure disorder -- Keppra  500 mg daily on Sunday/Tuesday/Thursday/Saturday -- Keppra  750 mg p.o. daily on Monday/Wednesday/Friday -- Outpatient follow-up with neurology  Anemia of chronic medical/renal disease Hemoglobin 7.7, MCV 75.  Anemia panel with iron 43, TIBC  141, ferritin 820, folate 6.1, B12 501; consistent with  anemia of chronic medical/renal disease. -- Transfuse for hemoglobin less than 7.0  Autonomic dysfunction -- Midodrine  5 mg p.o. 3 times daily PRN  Chronic diarrhea -- Imodium  as needed   DVT prophylaxis: SCDs Start: 03/24/23 2107    Code Status: Full Code Family Communication: No family present at bedside this morning  Disposition Plan:  Level of care: Med-Surg Status is: Observation The patient remains OBS appropriate and will d/c before 2 midnights.    Consultants:  Nephrology  Procedures:  None  Antimicrobials:  None   Subjective: Patient seen examined bedside, resting calmly.  Lying in bed.  Watching TV.  No specific complaints this morning.Denies headache, no chest pain, no palpitations, no shortness of breath, no abdominal pain, no fever/chills/night sweats, no nausea/vomiting, no focal weakness, no fatigue, no paresthesia.  No acute events overnight per nursing staff.  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  Patient currently appealing denial.  Objective: Vitals:   03/29/23 1520 03/29/23 1535 03/29/23 1949 03/30/23 0409  BP: 111/61 116/68 123/69 (!) 158/81  Pulse: 61 60 67 69  Resp: 18 17 18 18   Temp: 97.9 F (36.6 C) 97.7 F (36.5 C) 98.9 F (37.2 C) 97.8 F (36.6 C)  TempSrc: Oral Oral Oral Oral  SpO2: 100% 100% 100% 100%  Weight: 72.1 kg     Height:        Intake/Output Summary (Last 24 hours) at 03/30/2023 9072 Last data filed at 03/29/2023 1520 Gross per 24 hour  Intake --  Output 500 ml  Net -500 ml    Filed Weights   03/26/23 2155 03/29/23 1138 03/29/23 1520  Weight: 69.3 kg 72.5 kg 72.1 kg    Examination:  Physical Exam: GEN: NAD, alert and oriented x 3, chronically ill in appearance, appears older than stated age HEENT: NCAT, noted enucleated left eye, MMM PULM: CTAB w/o  wheezes/crackles, normal respiratory effort, on room air CV: RRR w/o M/G/R GI: abd soft, NTND, NABS, no R/G/M MSK: Right BKA site noted with Kerlix dressing in place, clean/dry/intact NEURO: CN II-XII intact, no focal deficits, sensation to light touch intact PSYCH: normal mood/affect Integumentary: Right BKA site as above, otherwise no other concerning rashes/lesions/wounds noted on exposed skin surfaces.    Data Reviewed: I have personally reviewed following labs and imaging studies  CBC: Recent Labs  Lab 03/24/23 1034 03/25/23 0343 03/26/23 0526 03/29/23 1211  WBC 5.3 5.4 7.2 6.0  NEUTROABS 2.7  --   --   --   HGB 7.5* 8.4* 7.8* 7.7*  HCT 25.1* 29.0* 27.4* 25.4*  MCV 75.1* 75.5* 75.5* 75.1*  PLT 167 183 182 181   Basic Metabolic Panel: Recent Labs  Lab 03/24/23 1034 03/25/23 0343 03/26/23 0526 03/29/23 1211  NA 139 141 137 135  K 4.0 4.6 6.0* 6.0*  CL 104 108 106 99  CO2 23 20* 21* 22  GLUCOSE 76 81 70 75  BUN 43* 48* 70* 89*  CREATININE 7.80* 9.25* 10.25* 10.54*  CALCIUM  8.8* 9.1 8.8* 9.0  PHOS  --   --  6.4* 7.1*   GFR: Estimated Creatinine Clearance: 8.1 mL/min (A) (by C-G formula based on SCr of 10.54 mg/dL (H)). Liver Function Tests: Recent Labs  Lab 03/24/23 1034 03/26/23 0526 03/29/23 1211  AST 13*  --   --  ALT 14  --   --   ALKPHOS 62  --   --   BILITOT 0.7  --   --   PROT 6.4*  --   --   ALBUMIN  2.8* 2.9* 2.7*   No results for input(s): LIPASE, AMYLASE in the last 168 hours. No results for input(s): AMMONIA in the last 168 hours. Coagulation Profile: No results for input(s): INR, PROTIME in the last 168 hours. Cardiac Enzymes: No results for input(s): CKTOTAL, CKMB, CKMBINDEX, TROPONINI in the last 168 hours. BNP (last 3 results) No results for input(s): PROBNP in the last 8760 hours. HbA1C: No results for input(s): HGBA1C in the last 72 hours. CBG: Recent Labs  Lab 03/24/23 1029 03/24/23 1133 03/24/23 1541  03/24/23 2333 03/25/23 1558  GLUCAP 66* 63* 80 192* 91   Lipid Profile: No results for input(s): CHOL, HDL, LDLCALC, TRIG, CHOLHDL, LDLDIRECT in the last 72 hours. Thyroid  Function Tests: No results for input(s): TSH, T4TOTAL, FREET4, T3FREE, THYROIDAB in the last 72 hours. Anemia Panel: No results for input(s): VITAMINB12, FOLATE, FERRITIN, TIBC, IRON, RETICCTPCT in the last 72 hours.  Sepsis Labs: No results for input(s): PROCALCITON, LATICACIDVEN in the last 168 hours.  Recent Results (from the past 240 hours)  Resp panel by RT-PCR (RSV, Flu A&B, Covid) Anterior Nasal Swab     Status: None   Collection Time: 03/24/23 10:04 AM   Specimen: Anterior Nasal Swab  Result Value Ref Range Status   SARS Coronavirus 2 by RT PCR NEGATIVE NEGATIVE Final    Comment: (NOTE) SARS-CoV-2 target nucleic acids are NOT DETECTED.  The SARS-CoV-2 RNA is generally detectable in upper respiratory specimens during the acute phase of infection. The lowest concentration of SARS-CoV-2 viral copies this assay can detect is 138 copies/mL. A negative result does not preclude SARS-Cov-2 infection and should not be used as the sole basis for treatment or other patient management decisions. A negative result may occur with  improper specimen collection/handling, submission of specimen other than nasopharyngeal swab, presence of viral mutation(s) within the areas targeted by this assay, and inadequate number of viral copies(<138 copies/mL). A negative result must be combined with clinical observations, patient history, and epidemiological information. The expected result is Negative.  Fact Sheet for Patients:  bloggercourse.com  Fact Sheet for Healthcare Providers:  seriousbroker.it  This test is no t yet approved or cleared by the [place]  FDA and  has been authorized for detection and/or diagnosis of SARS-CoV-2  by FDA under an Emergency Use Authorization (EUA). This EUA will remain  in effect (meaning this test can be used) for the duration of the COVID-19 declaration under Section 564(b)(1) of the Act, 21 U.S.C.section 360bbb-3(b)(1), unless the authorization is terminated  or revoked sooner.       Influenza A by PCR NEGATIVE NEGATIVE Final   Influenza B by PCR NEGATIVE NEGATIVE Final    Comment: (NOTE) The Xpert Xpress SARS-CoV-2/FLU/RSV plus assay is intended as an aid in the diagnosis of influenza from Nasopharyngeal swab specimens and should not be used as a sole basis for treatment. Nasal washings and aspirates are unacceptable for Xpert Xpress SARS-CoV-2/FLU/RSV testing.  Fact Sheet for Patients: bloggercourse.com  Fact Sheet for Healthcare Providers: seriousbroker.it  This test is not yet approved or cleared by the [place]  FDA and has been authorized for detection and/or diagnosis of SARS-CoV-2 by FDA under an Emergency Use Authorization (EUA). This EUA will remain in effect (meaning this test can be used) for the duration  of the COVID-19 declaration under Section 564(b)(1) of the Act, 21 U.S.C. section 360bbb-3(b)(1), unless the authorization is terminated or revoked.     Resp Syncytial Virus by PCR NEGATIVE NEGATIVE Final    Comment: (NOTE) Fact Sheet for Patients: bloggercourse.com  Fact Sheet for Healthcare Providers: seriousbroker.it  This test is not yet approved or cleared by the [place]  FDA and has been authorized for detection and/or diagnosis of SARS-CoV-2 by FDA under an Emergency Use Authorization (EUA). This EUA will remain in effect (meaning this test can be used) for the duration of the COVID-19 declaration under Section 564(b)(1) of the Act, 21 U.S.C. section 360bbb-3(b)(1), unless the authorization is terminated or revoked.  Performed at  [facility], [address] [place], [place] [address]   Gastrointestinal Panel by PCR , Stool     Status: None   Collection Time: 03/25/23  3:49 AM   Specimen: Stool  Result Value Ref Range Status   Campylobacter species NOT DETECTED NOT DETECTED Final   Plesimonas shigelloides NOT DETECTED NOT DETECTED Final   Salmonella species NOT DETECTED NOT DETECTED Final   Yersinia enterocolitica NOT DETECTED NOT DETECTED Final   Vibrio species NOT DETECTED NOT DETECTED Final   Vibrio cholerae NOT DETECTED NOT DETECTED Final   Enteroaggregative E coli (EAEC) NOT DETECTED NOT DETECTED Final   Enteropathogenic E coli (EPEC) NOT DETECTED NOT DETECTED Final   Enterotoxigenic E coli (ETEC) NOT DETECTED NOT DETECTED Final   Shiga like toxin producing E coli (STEC) NOT DETECTED NOT DETECTED Final   Shigella/Enteroinvasive E coli (EIEC) NOT DETECTED NOT DETECTED Final   Cryptosporidium NOT DETECTED NOT DETECTED Final   Cyclospora cayetanensis NOT DETECTED NOT DETECTED Final   Entamoeba histolytica NOT DETECTED NOT DETECTED Final   Giardia lamblia NOT DETECTED NOT DETECTED Final   Adenovirus F40/41 NOT DETECTED NOT DETECTED Final   Astrovirus NOT DETECTED NOT DETECTED Final   Norovirus GI/GII NOT DETECTED NOT DETECTED Final   Rotavirus A NOT DETECTED NOT DETECTED Final   Sapovirus (I, II, IV, and V) NOT DETECTED NOT DETECTED Final    Comment: Performed at [facility], [address] [place], [place] [address]  C Difficile Quick Screen w PCR reflex     Status: None   Collection Time: 03/25/23  3:50 AM   Specimen: Stool  Result Value Ref Range Status   C Diff antigen NEGATIVE NEGATIVE Final   C Diff toxin NEGATIVE NEGATIVE Final   C Diff interpretation No C. difficile detected.  Final    Comment: Performed at [facility], [address], [place], [place] [address]         Radiology Studies: No results found.       Scheduled Meds:  (feeding supplement) PROSource Plus  30 mL  Oral TID BM   amiodarone   200 mg Oral Daily   aspirin  EC  81 mg Oral Daily   atorvastatin   80 mg Oral QHS   calcitRIOL   2.25 mcg Oral Q T,Th,Sat-1800   calcium  acetate  1,334 mg Oral TID WC   carvedilol   12.5 mg Oral BID   Chlorhexidine  Gluconate Cloth  6 each Topical Q0600   cinacalcet   60 mg Oral Q T,Th,Sat-1800   darbepoetin (ARANESP ) injection - DIALYSIS  150 mcg Subcutaneous Q Tue-1800   dorzolamide -timolol   1 drop Right Eye BID   leptospermum manuka honey  1 Application Topical Daily   levETIRAcetam   500 mg Oral Once per day on Sunday Tuesday Thursday Saturday  levETIRAcetam   750 mg Oral Once per day on Monday Wednesday Friday   midodrine   5 mg Oral TID WC   multivitamin  1 tablet Oral QHS   nutrition supplement (JUVEN)  1 packet Oral BID BM   pneumococcal 20-valent conjugate vaccine  0.5 mL Intramuscular Tomorrow-1000   Continuous Infusions:   LOS: 0 days    Time spent: 45 minutes spent on chart review, discussion with nursing staff, consultants, updating family and interview/physical exam; more than 50% of that time was spent in counseling and/or coordination of care.    Camellia PARAS Heitor Steinhoff, DO [facility] Available via Epic secure chat 7am-7pm After these hours, please refer to coverage provider listed on [email or web address] 03/30/2023, 9:27 AM

## 2023-03-30 NOTE — TOC Progression Note (Signed)
 Transition of Care (TOC) - Progression Note    Patient Details  Name: Lekeith Wulf. MRN: [id number] Date of Birth: 11-06-1970  Transition of Care [facility]) CM/SW Contact  Lucie Lunger, [place] Phone Number: 03/30/2023, 10:30 AM  Clinical Narrative:    Updated OT note and MD progress notes added to clinical packet and faxed to [facility] for review of fast track SNF appeal. TOC to follow.     Barriers to Discharge: Insurance Authorization  Expected Discharge Plan and Services                                               Social Determinants of Health (SDOH) Interventions SDOH Screenings   Food Insecurity: No Food Insecurity (03/25/2023)  Housing: Low Risk  (03/25/2023)  Transportation Needs: No Transportation Needs (03/25/2023)  Utilities: Not At Risk (03/25/2023)  Financial Resource Strain: High Risk (07/05/2017)  Physical Activity: Inactive (07/05/2017)  Social Connections: Moderately Integrated (07/05/2017)  Stress: Stress Concern Present (07/05/2017)  Tobacco Use: Low Risk  (03/25/2023)  Health Literacy: Low Risk  (06/01/2020)   Received from [facility], [facility]    Readmission Risk Interventions    07/27/2021   11:49 AM 11/27/2020    3:06 PM  Readmission Risk Prevention Plan  Transportation Screening Complete Complete  PCP or Specialist Appt within 3-5 Days  Complete  HRI or Home Care Consult  Complete  Social Work Consult for Recovery Care Planning/Counseling  Complete  Palliative Care Screening  Not Applicable  Medication Review Oceanographer) Complete Complete  SW Recovery Care/Counseling Consult Complete   Palliative Care Screening Not Applicable   Skilled Nursing Facility Not Applicable

## 2023-03-30 NOTE — Plan of Care (Signed)
  Problem: Education: Goal: Knowledge of General Education information will improve Description: Including pain rating scale, medication(s)/side effects and non-pharmacologic comfort measures Outcome: Progressing   Problem: Nutrition: Goal: Adequate nutrition will be maintained Outcome: Progressing   Problem: Pain Managment: Goal: General experience of comfort will improve and/or be controlled Outcome: Progressing

## 2023-03-31 ENCOUNTER — Encounter: Payer: Medicare HMO | Admitting: Orthopedic Surgery

## 2023-03-31 ENCOUNTER — Encounter (HOSPITAL_COMMUNITY): Payer: Self-pay | Admitting: Internal Medicine

## 2023-03-31 DIAGNOSIS — R531 Weakness: Secondary | ICD-10-CM | POA: Diagnosis not present

## 2023-03-31 LAB — CBC
HCT: 25.4 % — ABNORMAL LOW (ref 39.0–52.0)
Hemoglobin: 7.5 g/dL — ABNORMAL LOW (ref 13.0–17.0)
MCH: 22.3 pg — ABNORMAL LOW (ref 26.0–34.0)
MCHC: 29.5 g/dL — ABNORMAL LOW (ref 30.0–36.0)
MCV: 75.6 fL — ABNORMAL LOW (ref 80.0–100.0)
Platelets: 183 10*3/uL (ref 150–400)
RBC: 3.36 MIL/uL — ABNORMAL LOW (ref 4.22–5.81)
RDW: 21.4 % — ABNORMAL HIGH (ref 11.5–15.5)
WBC: 6.5 10*3/uL (ref 4.0–10.5)
nRBC: 0 % (ref 0.0–0.2)

## 2023-03-31 LAB — RENAL FUNCTION PANEL
Albumin: 2.7 g/dL — ABNORMAL LOW (ref 3.5–5.0)
Anion gap: 12 (ref 5–15)
BUN: 62 mg/dL — ABNORMAL HIGH (ref 6–20)
CO2: 24 mmol/L (ref 22–32)
Calcium: 8.9 mg/dL (ref 8.9–10.3)
Chloride: 103 mmol/L (ref 98–111)
Creatinine, Ser: 9.02 mg/dL — ABNORMAL HIGH (ref 0.61–1.24)
GFR, Estimated: 6 mL/min — ABNORMAL LOW (ref >60)
Glucose, Bld: 89 mg/dL (ref 70–99)
Phosphorus: 5.2 mg/dL — ABNORMAL HIGH (ref 2.5–4.6)
Potassium: 6.8 mmol/L (ref 3.5–5.1)
Sodium: 139 mmol/L (ref 135–145)

## 2023-03-31 LAB — GLUCOSE, CAPILLARY: Glucose-Capillary: 86 mg/dL (ref 70–99)

## 2023-03-31 MED ORDER — HEPARIN SODIUM (PORCINE) 1000 UNIT/ML DIALYSIS: 20.0000 [IU]/kg | INTRAMUSCULAR | Status: DC | PRN

## 2023-03-31 NOTE — Plan of Care (Signed)
   Problem: Health Behavior/Discharge Planning: Goal: Ability to manage health-related needs will improve Outcome: Progressing

## 2023-03-31 NOTE — Telephone Encounter (Signed)
 Advised Dr. Julio Ohm that pt is in the hospital and does have incisional dehiscence of BKA from 01/2023. Per Dr. Julio Ohm sent message to Cristine Done to ask if they would transfer pt to [facility] for eval of revision and left heel ulcer.

## 2023-03-31 NOTE — Progress Notes (Signed)
 Pt alert and oriented throughout shift. Multiple calls to nurses station to be placed on bedpan. Patient placed on bedpan approximately 5 times overnight with 2 Large BM only. No acute events over night. [place] RN

## 2023-03-31 NOTE — Telephone Encounter (Signed)
 This pt is s/p a right BKA 01/2023 incision dehiscence noted at last office visit. he is in the hospital please see message below.

## 2023-03-31 NOTE — TOC Progression Note (Signed)
 Transition of Care (TOC) - Progression Note    Patient Details  Name: [name]. MRN: [id number] Date of Birth: 07/08/1970  Transition of Care [facility]) CM/SW Contact  Rollo Petri, LCSW Phone Number: 03/31/2023, 11:23 AM  Clinical Narrative:      TOC following. Per MD, pt transferring to [facility] for debridement. Will update insurance handling SNF auth appeal.   Updated [facility] at [facility]. [facility] TOC will follow and start new SNF auth when pt stable.   Barriers to Discharge: Insurance Authorization  Expected Discharge Plan and Services                                               Social Determinants of Health (SDOH) Interventions SDOH Screenings   Food Insecurity: No Food Insecurity (03/25/2023)  Housing: Low Risk  (03/25/2023)  Transportation Needs: No Transportation Needs (03/25/2023)  Utilities: Not At Risk (03/25/2023)  Financial Resource Strain: High Risk (07/05/2017)  Physical Activity: Inactive (07/05/2017)  Social Connections: Moderately Integrated (07/05/2017)  Stress: Stress Concern Present (07/05/2017)  Tobacco Use: Low Risk  (03/25/2023)  Health Literacy: Low Risk  (06/01/2020)   Received from [facility], [facility]    Readmission Risk Interventions    07/27/2021   11:49 AM 11/27/2020    3:06 PM  Readmission Risk Prevention Plan  Transportation Screening Complete Complete  PCP or Specialist Appt within 3-5 Days  Complete  HRI or Home Care Consult  Complete  Social Work Consult for Recovery Care Planning/Counseling  Complete  Palliative Care Screening  Not Applicable  Medication Review Oceanographer) Complete Complete  SW Recovery Care/Counseling Consult Complete   Palliative Care Screening Not Applicable   Skilled Nursing Facility Not Applicable

## 2023-03-31 NOTE — Progress Notes (Signed)
 Patient ID: [name] Daune Raddle., male   DOB: 1971/01/26, 53 y.o.   MRN: [id number] S: No events overnight. O:BP 137/73 (BP Location: Right Arm)   Pulse 66   Temp 98.9 F (37.2 C) (Oral)   Resp 18   Ht 6' 3 (1.905 m)   Wt 72.1 kg   SpO2 100%   BMI 19.87 kg/m   Intake/Output Summary (Last 24 hours) at 03/31/2023 1013 Last data filed at 03/31/2023 0217 Gross per 24 hour  Intake 240 ml  Output --  Net 240 ml   Intake/Output: I/O last 3 completed shifts: In: 240 [P.O.:240] Out: -   Intake/Output this shift:  No intake/output data recorded. Weight change:  Gen: NAD CVS: RRR Resp:CTA Abd:+BS, soft, NT/ND Ext: no edema, s/p RBKA, LUE AVG +T/B  Recent Labs  Lab 03/24/23 1034 03/25/23 0343 03/26/23 0526 03/29/23 1211  NA 139 141 137 135  K 4.0 4.6 6.0* 6.0*  CL 104 108 106 99  CO2 23 20* 21* 22  GLUCOSE 76 81 70 75  BUN 43* 48* 70* 89*  CREATININE 7.80* 9.25* 10.25* 10.54*  ALBUMIN  2.8*  --  2.9* 2.7*  CALCIUM  8.8* 9.1 8.8* 9.0  PHOS  --   --  6.4* 7.1*  AST 13*  --   --   --   ALT 14  --   --   --    Liver Function Tests: Recent Labs  Lab 03/24/23 1034 03/26/23 0526 03/29/23 1211  AST 13*  --   --   ALT 14  --   --   ALKPHOS 62  --   --   BILITOT 0.7  --   --   PROT 6.4*  --   --   ALBUMIN  2.8* 2.9* 2.7*   No results for input(s): LIPASE, AMYLASE in the last 168 hours. No results for input(s): AMMONIA in the last 168 hours. CBC: Recent Labs  Lab 03/24/23 1034 03/25/23 0343 03/26/23 0526 03/29/23 1211  WBC 5.3 5.4 7.2 6.0  NEUTROABS 2.7  --   --   --   HGB 7.5* 8.4* 7.8* 7.7*  HCT 25.1* 29.0* 27.4* 25.4*  MCV 75.1* 75.5* 75.5* 75.1*  PLT 167 183 182 181   Cardiac Enzymes: No results for input(s): CKTOTAL, CKMB, CKMBINDEX, TROPONINI in the last 168 hours. CBG: Recent Labs  Lab 03/24/23 1029 03/24/23 1133 03/24/23 1541 03/24/23 2333 03/25/23 1558  GLUCAP 66* 63* 80 192* 91    Iron Studies: No results for input(s): IRON,  TIBC, TRANSFERRIN, FERRITIN in the last 72 hours. Studies/Results: No results found.  (feeding supplement) PROSource Plus  30 mL Oral TID BM   amiodarone   200 mg Oral Daily   aspirin  EC  81 mg Oral Daily   atorvastatin   80 mg Oral QHS   calcitRIOL   2.25 mcg Oral Q T,Th,Sat-1800   calcium  acetate  1,334 mg Oral TID WC   carvedilol   12.5 mg Oral BID   Chlorhexidine  Gluconate Cloth  6 each Topical Q0600   cinacalcet   60 mg Oral Q T,Th,Sat-1800   darbepoetin (ARANESP ) injection - DIALYSIS  150 mcg Subcutaneous Q Tue-1800   dorzolamide -timolol   1 drop Right Eye BID   leptospermum manuka honey  1 Application Topical Daily   levETIRAcetam   500 mg Oral Once per day on Sunday Tuesday Thursday Saturday   levETIRAcetam   750 mg Oral Once per day on Monday Wednesday Friday   midodrine   5 mg Oral TID WC   multivitamin  1 tablet Oral QHS   nutrition supplement (JUVEN)  1 packet Oral BID BM   pneumococcal 20-valent conjugate vaccine  0.5 mL Intramuscular Tomorrow-1000    BMET    Component Value Date/Time   NA 135 03/29/2023 1211   K 6.0 (H) 03/29/2023 1211   CL 99 03/29/2023 1211   CO2 22 03/29/2023 1211   GLUCOSE 75 03/29/2023 1211   BUN 89 (H) 03/29/2023 1211   CREATININE 10.54 (H) 03/29/2023 1211   CALCIUM  9.0 03/29/2023 1211   GFRNONAA 5 (L) 03/29/2023 1211   GFRAA 5 (L) 08/27/2019 1540   CBC    Component Value Date/Time   WBC 6.0 03/29/2023 1211   RBC 3.38 (L) 03/29/2023 1211   HGB 7.7 (L) 03/29/2023 1211   HCT 25.4 (L) 03/29/2023 1211   PLT 181 03/29/2023 1211   MCV 75.1 (L) 03/29/2023 1211   MCH 22.8 (L) 03/29/2023 1211   MCHC 30.3 03/29/2023 1211   RDW 21.2 (H) 03/29/2023 1211   LYMPHSABS 1.6 03/24/2023 1034   MONOABS 0.5 03/24/2023 1034   EOSABS 0.4 03/24/2023 1034   BASOSABS 0.1 03/24/2023 1034     Outpatient HD orders:  [facility]  TTS  3 hrs and 15 minutes EDW 72 kg but being assessed after amputation and hospitalization  BF 400  DF 800  LUE AVF   2K/2Ca Last post weight was 71.7 kg on on 1/30 Medications - they have not gotten labs yet at [facility] so he hasn't gotten ESA restarted or any other scheduled in-center meds.  He came back to [facility] on 03/17/23 Heparin  400 units/hr and a loading dose of 1000 units     Assessment/Plan:   # ESRD  - HD per TTS schedule  - He has most recently been at [facility] but this may need to change depending on SNF placement    # Atrial fibrillation  - agents per primary team - he is on amio and coreg  - note that midodrine  is ordered for support to allow these agents as well    # Anemia of CKD - No ESA given yet at [facility] per outpatient RN - she states that he has been there since 03/17/23  - ESA can be given at least 03/29/23 - will order in the event that he is inpatient.  (Aranesp  150 mcg every Tuesday is ordered)   # Metabolic bone disease - on sensipar  and calcitriol  per prior regimen currently - calcium  acceptable   - renal panel in AM  # Hyperkalemia  -gave dose of Lokelma  yesterday and will use 1K bath for first 30 min today with HD.   # Weakness - Patient/family unable to care for him at home and SNF has been recommended    Disposition per primary team.  Per charting awaiting insurance authorization for SNF.  Note that once his SNF is confirmed will need to ensure that he has a HD unit nearby  [name] RONAL Sellar, MD [employer] [phone]

## 2023-03-31 NOTE — Procedures (Addendum)
 3.25 Hr  hemodialysis txmt completed , slept the entire Txmt , Notified Dr of K+ level  of 6.8 that was drawn pre txmt. New order obtain by Dr.Left via stretcher , via paramedics to be transported to [facility], for follow up on foot care from previous surgery. Alert, and hungry.Ate potato chips prior to coming txmt, stated his wife brought in. Educated pt on need to follow low K+ diet. Post assess , no changes from pre assess, Dry clean dressing intact

## 2023-03-31 NOTE — Progress Notes (Addendum)
 PROGRESS NOTE    [name].  FMW:[id number] DOB: [birth year] DOA: 03/24/2023 PCP: [name], FNP    Brief Narrative:   [name] Cervi. is a 53 y.o. male with past medical history significant for ESRD on HD TTS, recent right BKA (Dr. Harden), CAD, CVA, paroxysmal atrial fibrillation not on anticoagulation due to history of ICH, gastroparesis, type 2 diabetes mellitus, diabetic retinopathy, glaucoma, history of left eye enucleation who presented to [name] ED on 1/30 via EMS from dialysis unit with complaints of increased blurriness, weakness, cough and difficulty managing at home.  Patient states symptoms worse with dialysis.  Recently discharged from the hospital to SNF for 3 weeks and has been home over the last 10 days.  Patient reports is unable to take care of himself due to weakness and spouse working all the time.  Requesting placement.  In the ED, temperature 97.7 F, HR 64, RR 10, BP 147/79, SpO2 100% on room air.  WBC 5.3, hemoglobin 7.5, platelet count 167.  Sodium 139, potassium 4.0, chloride 104, CO2 23, glucose 76, BUN 43, creatinine 7.80.  AST 13, ALT 14, total bilirubin 0.7.  COVID/influenza/RSV PCR negative.  MR angiogram head without contrast with no intracranial large vessel occlusion or significant stenosis.  [name] discussed with ophthalmology on-call, Dr. [name] regarding his blurred vision in the setting of known diabetic retinopathy, glaucoma and history of left eye enucleation; ophthalmology recommended no other workup at this time and vision to be monitored and outpatient follow-up with his retina specialist.  [name] consulted for admission for further evaluation management of progressive weakness with TOC involvement for placement.  Assessment & Plan:   Generalized weakness/deconditioning/gait disturbance Patient presenting to the ED from his HD unit with dizziness, progressive weakness and difficulty managing at home.  Recent prolonged hospitalization followed  by subacute rehab after right BKA by Dr Harden on 02/09/2023.  Prior to surgical invention patient was independent at baseline but has suffered his significant decline since. -- Continue PT/OT efforts while inpatient -- TOC consulted for SNF placement; insurance denied SNF authorization, pending appeal  History of gangrene right foot s/p BKA Patient with recent BKA February 09, 2023 by Dr. Harden.  Recently seen in outpatient office visit 03/22/2023. -- Cleanse stump wound and left heel wounds with Vahse, paint both areas with betadine , top with foam  -- Apply Medihoney to the stump wound, top with saline moist gauze, and kerlix with ACE wrap.  -- Pending transfer to [facility] for evaluation by his orthopedic surgeon, Dr. Harden with likely plans surgical debridement  ESRD on HD TTS Currently dialyzes at [facility]. -- Nephrology consulted for continued HD while inpatient; HD today  Paroxysmal atrial fibrillation -- Amiodarone  200 mg p.o. daily -- Carvedilol  12.5 mg p.o. twice daily -- Not on anticoagulation due to history of ICH  Type 2 diabetes mellitus Hemoglobin A1c 4.5% on 11/21/2021.  Diet controlled at baseline. -- continue intermittent monitoring of glucose  Diabetic retinopathy, glaucoma and history of left eye enucleation Patient complaining of dizziness, known diabetic retinopathy, left eye enucleation follows with retina specialist outpatient.  [name] discussed with ophthalmology on-call, Dr. [name] regarding his blurred vision with no further recommendations other than outpatient follow-up with his retina specialist.  MRA brain with no large vessel occlusion or significant stenosis. -- Outpatient follow-up with ophthalmology  Seizure disorder -- Keppra  500 mg daily on Sunday/Tuesday/Thursday/Saturday -- Keppra  750 mg p.o. daily on Monday/Wednesday/Friday -- Outpatient follow-up with neurology  Anemia of  chronic medical/renal disease Hemoglobin 7.7, MCV 75.  Anemia  panel with iron 43, TIBC 141, ferritin 820, folate 6.1, B12 501; consistent with anemia of chronic medical/renal disease. -- Transfuse for hemoglobin less than 7.0  Autonomic dysfunction -- Midodrine  5 mg p.o. 3 times daily PRN  Chronic diarrhea -- Imodium  as needed   DVT prophylaxis: SCDs Start: 03/24/23 2107    Code Status: Full Code Family Communication: No family present at bedside this morning  Disposition Plan:  Level of care: Med-Surg Status is: Observation The patient remains OBS appropriate and will d/c before 2 midnights.    Consultants:  Nephrology  Procedures:  None  Antimicrobials:  None   Subjective: Patient seen examined bedside, resting calmly.  Lying in bed.  Watching TV.  No specific complaints this morning.eager to hopefully work with physical therapy today.  Due for HD today.  Denies headache, no chest pain, no palpitations, no shortness of breath, no abdominal pain, no fever/chills/night sweats, no nausea/vomiting, no focal weakness, no fatigue, no paresthesia.  No acute events overnight per nursing staff.  Peer-to-peer performed on 03/29/2023, insurance provider declined authorization; pending patient appeal.    Received message from orthopedics, Dr. Crist office requesting transfer to [facility] for need for further surgical debridement of BKA stump wound.  Objective: Vitals:   03/30/23 0409 03/30/23 1356 03/30/23 1954 03/31/23 0447  BP: (!) 158/81 (!) 118/16 (!) 159/75 137/73  Pulse: 69 67 64 66  Resp: 18 18 18 18   Temp: 97.8 F (36.6 C) 98.1 F (36.7 C) 98 F (36.7 C) 98.9 F (37.2 C)  TempSrc: Oral Oral Oral Oral  SpO2: 100% 100% 100% 100%  Weight:      Height:        Intake/Output Summary (Last 24 hours) at 03/31/2023 9147 Last data filed at 03/31/2023 0217 Gross per 24 hour  Intake 240 ml  Output --  Net 240 ml    Filed Weights   03/26/23 2155 03/29/23 1138 03/29/23 1520  Weight: 69.3 kg 72.5 kg 72.1 kg     Examination:  Physical Exam: GEN: NAD, alert and oriented x 3, chronically ill in appearance, appears older than stated age HEENT: NCAT, noted enucleated left eye, MMM PULM: CTAB w/o wheezes/crackles, normal respiratory effort, on room air CV: RRR w/o M/G/R GI: abd soft, NTND, NABS, no R/G/M MSK: Right BKA site noted with Kerlix dressing in place, clean/dry/intact NEURO: CN II-XII intact, no focal deficits, sensation to light touch intact PSYCH: normal mood/affect Integumentary: Right BKA site as above, otherwise no other concerning rashes/lesions/wounds noted on exposed skin surfaces.    Data Reviewed: I have personally reviewed following labs and imaging studies  CBC: Recent Labs  Lab 03/24/23 1034 03/25/23 0343 03/26/23 0526 03/29/23 1211  WBC 5.3 5.4 7.2 6.0  NEUTROABS 2.7  --   --   --   HGB 7.5* 8.4* 7.8* 7.7*  HCT 25.1* 29.0* 27.4* 25.4*  MCV 75.1* 75.5* 75.5* 75.1*  PLT 167 183 182 181   Basic Metabolic Panel: Recent Labs  Lab 03/24/23 1034 03/25/23 0343 03/26/23 0526 03/29/23 1211  NA 139 141 137 135  K 4.0 4.6 6.0* 6.0*  CL 104 108 106 99  CO2 23 20* 21* 22  GLUCOSE 76 81 70 75  BUN 43* 48* 70* 89*  CREATININE 7.80* 9.25* 10.25* 10.54*  CALCIUM  8.8* 9.1 8.8* 9.0  PHOS  --   --  6.4* 7.1*   GFR: Estimated Creatinine Clearance: 8.1 mL/min (A) (by C-G  formula based on SCr of 10.54 mg/dL (H)). Liver Function Tests: Recent Labs  Lab 03/24/23 1034 03/26/23 0526 03/29/23 1211  AST 13*  --   --   ALT 14  --   --   ALKPHOS 62  --   --   BILITOT 0.7  --   --   PROT 6.4*  --   --   ALBUMIN  2.8* 2.9* 2.7*   No results for input(s): LIPASE, AMYLASE in the last 168 hours. No results for input(s): AMMONIA in the last 168 hours. Coagulation Profile: No results for input(s): INR, PROTIME in the last 168 hours. Cardiac Enzymes: No results for input(s): CKTOTAL, CKMB, CKMBINDEX, TROPONINI in the last 168 hours. BNP (last 3  results) No results for input(s): PROBNP in the last 8760 hours. HbA1C: No results for input(s): HGBA1C in the last 72 hours. CBG: Recent Labs  Lab 03/24/23 1029 03/24/23 1133 03/24/23 1541 03/24/23 2333 03/25/23 1558  GLUCAP 66* 63* 80 192* 91   Lipid Profile: No results for input(s): CHOL, HDL, LDLCALC, TRIG, CHOLHDL, LDLDIRECT in the last 72 hours. Thyroid  Function Tests: No results for input(s): TSH, T4TOTAL, FREET4, T3FREE, THYROIDAB in the last 72 hours. Anemia Panel: No results for input(s): VITAMINB12, FOLATE, FERRITIN, TIBC, IRON, RETICCTPCT in the last 72 hours.  Sepsis Labs: No results for input(s): PROCALCITON, LATICACIDVEN in the last 168 hours.  Recent Results (from the past 240 hours)  Resp panel by RT-PCR (RSV, Flu A&B, Covid) Anterior Nasal Swab     Status: None   Collection Time: 03/24/23 10:04 AM   Specimen: Anterior Nasal Swab  Result Value Ref Range Status   SARS Coronavirus 2 by RT PCR NEGATIVE NEGATIVE Final    Comment: (NOTE) SARS-CoV-2 target nucleic acids are NOT DETECTED.  The SARS-CoV-2 RNA is generally detectable in upper respiratory specimens during the acute phase of infection. The lowest concentration of SARS-CoV-2 viral copies this assay can detect is 138 copies/mL. A negative result does not preclude SARS-Cov-2 infection and should not be used as the sole basis for treatment or other patient management decisions. A negative result may occur with  improper specimen collection/handling, submission of specimen other than nasopharyngeal swab, presence of viral mutation(s) within the areas targeted by this assay, and inadequate number of viral copies(<138 copies/mL). A negative result must be combined with clinical observations, patient history, and epidemiological information. The expected result is Negative.  Fact Sheet for Patients:  bloggercourse.com  Fact Sheet for  Healthcare Providers:  seriousbroker.it  This test is no t yet approved or cleared by the [place]  FDA and  has been authorized for detection and/or diagnosis of SARS-CoV-2 by FDA under an Emergency Use Authorization (EUA). This EUA will remain  in effect (meaning this test can be used) for the duration of the COVID-19 declaration under Section 564(b)(1) of the Act, 21 U.S.C.section 360bbb-3(b)(1), unless the authorization is terminated  or revoked sooner.       Influenza A by PCR NEGATIVE NEGATIVE Final   Influenza B by PCR NEGATIVE NEGATIVE Final    Comment: (NOTE) The Xpert Xpress SARS-CoV-2/FLU/RSV plus assay is intended as an aid in the diagnosis of influenza from Nasopharyngeal swab specimens and should not be used as a sole basis for treatment. Nasal washings and aspirates are unacceptable for Xpert Xpress SARS-CoV-2/FLU/RSV testing.  Fact Sheet for Patients: bloggercourse.com  Fact Sheet for Healthcare Providers: seriousbroker.it  This test is not yet approved or cleared by the [place]  FDA and has  been authorized for detection and/or diagnosis of SARS-CoV-2 by FDA under an Emergency Use Authorization (EUA). This EUA will remain in effect (meaning this test can be used) for the duration of the COVID-19 declaration under Section 564(b)(1) of the Act, 21 U.S.C. section 360bbb-3(b)(1), unless the authorization is terminated or revoked.     Resp Syncytial Virus by PCR NEGATIVE NEGATIVE Final    Comment: (NOTE) Fact Sheet for Patients: bloggercourse.com  Fact Sheet for Healthcare Providers: seriousbroker.it  This test is not yet approved or cleared by the [place]  FDA and has been authorized for detection and/or diagnosis of SARS-CoV-2 by FDA under an Emergency Use Authorization (EUA). This EUA will remain in effect (meaning this  test can be used) for the duration of the COVID-19 declaration under Section 564(b)(1) of the Act, 21 U.S.C. section 360bbb-3(b)(1), unless the authorization is terminated or revoked.  Performed at [facility], [address] [place], [place] [address]   Gastrointestinal Panel by PCR , Stool     Status: None   Collection Time: 03/25/23  3:49 AM   Specimen: Stool  Result Value Ref Range Status   Campylobacter species NOT DETECTED NOT DETECTED Final   Plesimonas shigelloides NOT DETECTED NOT DETECTED Final   Salmonella species NOT DETECTED NOT DETECTED Final   Yersinia enterocolitica NOT DETECTED NOT DETECTED Final   Vibrio species NOT DETECTED NOT DETECTED Final   Vibrio cholerae NOT DETECTED NOT DETECTED Final   Enteroaggregative E coli (EAEC) NOT DETECTED NOT DETECTED Final   Enteropathogenic E coli (EPEC) NOT DETECTED NOT DETECTED Final   Enterotoxigenic E coli (ETEC) NOT DETECTED NOT DETECTED Final   Shiga like toxin producing E coli (STEC) NOT DETECTED NOT DETECTED Final   Shigella/Enteroinvasive E coli (EIEC) NOT DETECTED NOT DETECTED Final   Cryptosporidium NOT DETECTED NOT DETECTED Final   Cyclospora cayetanensis NOT DETECTED NOT DETECTED Final   Entamoeba histolytica NOT DETECTED NOT DETECTED Final   Giardia lamblia NOT DETECTED NOT DETECTED Final   Adenovirus F40/41 NOT DETECTED NOT DETECTED Final   Astrovirus NOT DETECTED NOT DETECTED Final   Norovirus GI/GII NOT DETECTED NOT DETECTED Final   Rotavirus A NOT DETECTED NOT DETECTED Final   Sapovirus (I, II, IV, and V) NOT DETECTED NOT DETECTED Final    Comment: Performed at [facility], [address] [place], [place] [address]  C Difficile Quick Screen w PCR reflex     Status: None   Collection Time: 03/25/23  3:50 AM   Specimen: Stool  Result Value Ref Range Status   C Diff antigen NEGATIVE NEGATIVE Final   C Diff toxin NEGATIVE NEGATIVE Final   C Diff interpretation No C. difficile detected.  Final     Comment: Performed at [facility], [address], [place], [place] [address]         Radiology Studies: No results found.       Scheduled Meds:  (feeding supplement) PROSource Plus  30 mL Oral TID BM   amiodarone   200 mg Oral Daily   aspirin  EC  81 mg Oral Daily   atorvastatin   80 mg Oral QHS   calcitRIOL   2.25 mcg Oral Q T,Th,Sat-1800   calcium  acetate  1,334 mg Oral TID WC   carvedilol   12.5 mg Oral BID   Chlorhexidine  Gluconate Cloth  6 each Topical Q0600   cinacalcet   60 mg Oral Q T,Th,Sat-1800   darbepoetin (ARANESP ) injection - DIALYSIS  150 mcg Subcutaneous Q Tue-1800   dorzolamide -timolol   1 drop Right Eye BID   leptospermum manuka honey  1 Application Topical Daily   levETIRAcetam   500 mg Oral Once per day on Sunday Tuesday Thursday Saturday   levETIRAcetam   750 mg Oral Once per day on Monday Wednesday Friday   midodrine   5 mg Oral TID WC   multivitamin  1 tablet Oral QHS   nutrition supplement (JUVEN)  1 packet Oral BID BM   pneumococcal 20-valent conjugate vaccine  0.5 mL Intramuscular Tomorrow-1000   Continuous Infusions:   LOS: 0 days    Time spent: 45 minutes spent on chart review, discussion with nursing staff, consultants, updating family and interview/physical exam; more than 50% of that time was spent in counseling and/or coordination of care.    Camellia PARAS Shanena Pellegrino, DO [facility] Available via Epic secure chat 7am-7pm After these hours, please refer to coverage provider listed on [email or web address] 03/31/2023, 8:52 AM

## 2023-04-01 DIAGNOSIS — R531 Weakness: Secondary | ICD-10-CM | POA: Diagnosis not present

## 2023-04-01 DIAGNOSIS — I480 Paroxysmal atrial fibrillation: Secondary | ICD-10-CM | POA: Diagnosis not present

## 2023-04-01 DIAGNOSIS — D638 Anemia in other chronic diseases classified elsewhere: Secondary | ICD-10-CM | POA: Diagnosis not present

## 2023-04-01 DIAGNOSIS — R5381 Other malaise: Secondary | ICD-10-CM

## 2023-04-01 DIAGNOSIS — I10 Essential (primary) hypertension: Secondary | ICD-10-CM

## 2023-04-01 DIAGNOSIS — N186 End stage renal disease: Secondary | ICD-10-CM | POA: Diagnosis not present

## 2023-04-01 LAB — BASIC METABOLIC PANEL
Anion gap: 11 (ref 5–15)
BUN: 47 mg/dL — ABNORMAL HIGH (ref 6–20)
CO2: 26 mmol/L (ref 22–32)
Calcium: 8.8 mg/dL — ABNORMAL LOW (ref 8.9–10.3)
Chloride: 101 mmol/L (ref 98–111)
Creatinine, Ser: 6.76 mg/dL — ABNORMAL HIGH (ref 0.61–1.24)
GFR, Estimated: 9 mL/min — ABNORMAL LOW (ref >60)
Glucose, Bld: 132 mg/dL — ABNORMAL HIGH (ref 70–99)
Potassium: 5.9 mmol/L — ABNORMAL HIGH (ref 3.5–5.1)
Sodium: 138 mmol/L (ref 135–145)

## 2023-04-01 LAB — SURGICAL PCR SCREEN
MRSA, PCR: NEGATIVE
Staphylococcus aureus: NEGATIVE

## 2023-04-01 MED ORDER — SODIUM ZIRCONIUM CYCLOSILICATE 10 G PO PACK
10.0000 g | PACK | Freq: Two times a day (BID) | ORAL | Status: AC
  Administered 2023-04-01 (×2): 10 g via ORAL
  Filled 2023-04-01 (×2): qty 1

## 2023-04-01 MED ORDER — CHLORHEXIDINE GLUCONATE CLOTH 2 % EX PADS
6.0000 | MEDICATED_PAD | Freq: Every day | CUTANEOUS | Status: DC
  Administered 2023-04-01 – 2023-04-03 (×3): 6 via TOPICAL

## 2023-04-01 MED ORDER — MUPIROCIN 2 % EX OINT
1.0000 | TOPICAL_OINTMENT | Freq: Two times a day (BID) | CUTANEOUS | Status: AC
  Administered 2023-04-01 – 2023-04-06 (×9): 1 via NASAL
  Filled 2023-04-01 (×2): qty 22

## 2023-04-01 NOTE — Progress Notes (Addendum)
 [facility]                                                                               [name], is a 53 y.o. male, DOB - 09-23-1970, FMW:[id number] Admit date - 03/24/2023    Outpatient Primary MD for the patient is [name], FNP  LOS - 0  days    Brief summary   [name]. is a 53 y.o. male with past medical history significant for ESRD on HD TTS, recent right BKA (Dr. Harden), CAD, CVA, paroxysmal atrial fibrillation not on anticoagulation due to history of ICH, gastroparesis, type 2 diabetes mellitus, diabetic retinopathy, glaucoma, history of left eye enucleation who presented to [name] on 1/30 via EMS from dialysis unit with complaints of increased blurriness, weakness, cough and difficulty managing at home.  Patient states symptoms worse with dialysis.  Recently discharged from the hospital to SNF for 3 weeks and has been home over the last 10 days.  Patient reports is unable to take care of himself due to weakness and spouse working all the time.  Requesting placement.  MR angiogram head without contrast with no intracranial large vessel occlusion or significant stenosis.  [name] discussed with ophthalmology on-call, Dr. [name] regarding his blurred vision in the setting of known diabetic retinopathy, glaucoma and history of left eye enucleation; ophthalmology recommended no other workup at this time and vision to be monitored and outpatient follow-up with his retina specialist.  [name] consulted for admission for further evaluation management of progressive weakness with TOC involvement for placement.   Therapy evaluations recommending SNF unfortunately insurance denied SNF authorization he is currently appealing the denial.   Assessment & Plan    Assessment and Plan:   Generalized weakness/deconditioning and gait disturbances With recent prolonged hospitalization followed by subacute rehab after right BKA by Dr. Harden on 02/09/2023.  Therapy  evaluations recommending SNF, insurance denied and patient is appealing the denial.    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 Harden.  Recently seen in outpatient office visit 03/22/2023. -- Cleanse stump wound and left heel wounds with Vahse, paint both areas with betadine , top with foam  -- Apply Medihoney to the stump wound, top with saline moist gauze, and kerlix with ACE wrap.  Awaiting Dr. Crist recommendations/plan/surgical debridement      Paroxysmal atrial fibrillation Rate controlled with amiodarone  and Coreg  Not on anticoagulation due to history of ICH.   Type 2 diabetes mellitus Control CBGs CBG (last 3)  Recent Labs    03/31/23 1440  GLUCAP 86      Seizure disorder Continue with Keppra  500 mg on Sunday Tuesday Thursday and Saturday and 750 mg daily on Monday Wednesday and Friday No new seizures since admission.   Anemia of chronic disease Transfuse to keep hemoglobin greater than 7.    ESRD on dialysis on TTS Patient currently gets his dialysis at [facility] clinic nephrology on board and appreciate input   Autonomic dysfunction Patient on midodrine  5 mg 3 times daily as needed   Hyperkalemia Continue with lokelma .  Recheck bmp in  am.     Pressure injury :  Pressure Injury 03/25/23 Thigh Right Unstageable - Full thickness tissue loss in which the base of the injury is covered by slough (yellow, tan, gray, green or brown) and/or eschar (tan, brown or black) in the wound bed. (Active)  03/25/23 1200  Location: Thigh  Location Orientation: Right  Staging: Unstageable - Full thickness tissue loss in which the base of the injury is covered by slough (yellow, tan, gray, green or brown) and/or eschar (tan, brown or black) in the wound bed.  Wound Description (Comments):   Present on Admission: Yes     Pressure Injury 03/25/23 Other (Comment) Right Unstageable - Full thickness  tissue loss in which the base of the injury is covered by slough (yellow, tan, gray, green or brown) and/or eschar (tan, brown or black) in the wound bed. (Active)  03/25/23 1200  Location: Other (Comment) (stump)  Location Orientation: Right  Staging: Unstageable - Full thickness tissue loss in which the base of the injury is covered by slough (yellow, tan, gray, green or brown) and/or eschar (tan, brown or black) in the wound bed.  Wound Description (Comments):   Present on Admission: Yes     Pressure Injury 03/25/23 Heel Left;Lateral Stage 2 -  Partial thickness loss of dermis presenting as a shallow open injury with a red, pink wound bed without slough. (Active)  03/25/23 1200  Location: Heel  Location Orientation: Left;Lateral  Staging: Stage 2 -  Partial thickness loss of dermis presenting as a shallow open injury with a red, pink wound bed without slough.  Wound Description (Comments):   Present on Admission: Yes     Pressure Injury 03/25/23 Heel Left;Medial Deep Tissue Pressure Injury - Purple or maroon localized area of discolored intact skin or blood-filled blister due to damage of underlying soft tissue from pressure and/or shear. (Active)  03/25/23 1200  Location: Heel  Location Orientation: Left;Medial  Staging: Deep Tissue Pressure Injury - Purple or maroon localized area of discolored intact skin or blood-filled blister due to damage of underlying soft tissue from pressure and/or shear.  Wound Description (Comments):   Present on Admission: Yes       Estimated body mass index is 20.03 kg/m as calculated from the following:   Height as of this encounter: 6' 3 (1.905 m).   Weight as of this encounter: 72.7 kg.  Code Status: Full code DVT Prophylaxis:  SCDs Start: 03/24/23 2107   Level of Care: Level of care: Med-Surg Family Communication: None at bedside  Disposition Plan:     Remains inpatient appropriate: Pending further evaluation by orthopedics  Procedures:   None  Consultants:   Nephrology.  Orthopedics Dr Harden.  Antimicrobials:   Anti-infectives (From admission, onward)    None        Medications  Scheduled Meds:  (feeding supplement) PROSource Plus  30 mL Oral TID BM   amiodarone   200 mg Oral Daily   aspirin  EC  81 mg Oral Daily   atorvastatin   80 mg Oral QHS   calcitRIOL   2.25 mcg Oral Q T,Th,Sat-1800   calcium  acetate  1,334 mg Oral TID WC   carvedilol   12.5 mg Oral BID   Chlorhexidine  Gluconate Cloth  6 each Topical Q0600   cinacalcet   60 mg Oral Q T,Th,Sat-1800   darbepoetin (ARANESP ) injection - DIALYSIS  150 mcg Subcutaneous Q Tue-1800   dorzolamide -timolol   1 drop Right Eye BID   leptospermum manuka honey  1 Application Topical  Daily   levETIRAcetam   500 mg Oral Once per day on Sunday Tuesday Thursday Saturday   levETIRAcetam   750 mg Oral Once per day on Monday Wednesday Friday   midodrine   5 mg Oral TID WC   multivitamin  1 tablet Oral QHS   nutrition supplement (JUVEN)  1 packet Oral BID BM   pneumococcal 20-valent conjugate vaccine  0.5 mL Intramuscular Tomorrow-1000   sodium zirconium cyclosilicate   10 g Oral BID   Continuous Infusions: PRN Meds:.acetaminophen  **OR** acetaminophen , heparin , heparin , hydrALAZINE , lidocaine  (PF), lidocaine -prilocaine , loperamide , mouth rinse, oxyCODONE , pentafluoroprop-tetrafluoroeth    Subjective:   Ozie Lager was seen and examined today.  No new complaints.   Objective:   Vitals:   03/31/23 2201 04/01/23 0727 04/01/23 1120 04/01/23 1624  BP: (!) 131/59 (!) 141/78 (!) 176/82 (!) 147/75  Pulse: 61 (!) 58 (!) 59 66  Resp:  16 20 18   Temp:  98.3 F (36.8 C) 98.6 F (37 C) 98.5 F (36.9 C)  TempSrc:  Oral Oral Oral  SpO2:  100% 100% 99%  Weight:      Height:        Intake/Output Summary (Last 24 hours) at 04/01/2023 1656 Last data filed at 03/31/2023 1834 Gross per 24 hour  Intake 240 ml  Output --  Net 240 ml   Filed Weights   03/29/23 1520 03/31/23  1044 03/31/23 1458  Weight: 72.1 kg 73.8 kg 72.7 kg     Exam General exam: Appears calm and comfortable  Respiratory system: Clear to auscultation. Respiratory effort normal. Cardiovascular system: S1 & S2 heard, RRR.  Gastrointestinal system: Abdomen is nondistended, soft and nontender.  Central nervous system: Alert and oriented. No focal neurological deficits. Extremities: Symmetric 5 x 5 power. Skin: No rashes,    Data Reviewed:  I have personally reviewed following labs and imaging studies   CBC Lab Results  Component Value Date   WBC 6.5 03/31/2023   RBC 3.36 (L) 03/31/2023   HGB 7.5 (L) 03/31/2023   HCT 25.4 (L) 03/31/2023   MCV 75.6 (L) 03/31/2023   MCH 22.3 (L) 03/31/2023   PLT 183 03/31/2023   MCHC 29.5 (L) 03/31/2023   RDW 21.4 (H) 03/31/2023   LYMPHSABS 1.6 03/24/2023   MONOABS 0.5 03/24/2023   EOSABS 0.4 03/24/2023   BASOSABS 0.1 03/24/2023     Last metabolic panel Lab Results  Component Value Date   NA 138 04/01/2023   K 5.9 (H) 04/01/2023   CL 101 04/01/2023   CO2 26 04/01/2023   BUN 47 (H) 04/01/2023   CREATININE 6.76 (H) 04/01/2023   GLUCOSE 132 (H) 04/01/2023   GFRNONAA 9 (L) 04/01/2023   GFRAA 5 (L) 08/27/2019   CALCIUM  8.8 (L) 04/01/2023   PHOS 5.2 (H) 03/31/2023   PROT 6.4 (L) 03/24/2023   ALBUMIN  2.7 (L) 03/31/2023   LABGLOB 2.9 08/19/2016   AGRATIO 0.9 08/19/2016   BILITOT 0.7 03/24/2023   ALKPHOS 62 03/24/2023   AST 13 (L) 03/24/2023   ALT 14 03/24/2023   ANIONGAP 11 04/01/2023    CBG (last 3)  Recent Labs    03/31/23 1440  GLUCAP 86      Coagulation Profile: No results for input(s): INR, PROTIME in the last 168 hours.   Radiology Studies: No results found.     Elgie Butter M.D. [facility] 04/01/2023, 4:56 PM  Available via Epic secure chat 7am-7pm After 7 pm, please refer to night coverage provider listed on amion.

## 2023-04-01 NOTE — Progress Notes (Signed)
 Physical Therapy Treatment Patient Details Name: [name]. MRN: [id number] DOB: 08-15-1970 Today's Date: 04/01/2023   History of Present Illness The pt is a 53 yo male presenting 1/30 from HD with blurriness and weakness since BKA on 02/09/23. Admitted to AP or FTT, inability to manage at home, transferred to [facility] 2/6 for surgical debridement. PMH includes:  ESRD on HD TTS, recent right BKA (Dr. Harden), CAD, CVA, paroxysmal atrial fibrillation not on anticoagulation due to history of ICH, gastroparesis, type 2 diabetes mellitus, diabetic retinopathy, glaucoma, and history of left eye enucleation.    PT Comments  The pt was agreeable to session with focus on attempting to complete stand-pivot transfer to recliner. He demos good ability to roll with bed rails, but still needs minA to mange trunk elevation due to poor core strength and seated balance. He continues to need min-modA to complete sit-stand transfers and was unable to complete pivot transfer today due to increased pain in L heel with standing. Will continue to benefit from skilled PT acutely and continued inpatient rehab <3hours/day after d/c as pt currently needs more assistance than is available at home (home alone during the day).     If plan is discharge home, recommend the following: A lot of help with bathing/dressing/bathroom;A lot of help with walking and/or transfers;Assistance with cooking/housework;Help with stairs or ramp for entrance   Can travel by private vehicle     No  Equipment Recommendations  Wheelchair (measurements PT);Wheelchair cushion (measurements PT);Hoyer lift    Recommendations for Other Services       Precautions / Restrictions Precautions Precautions: Fall Precaution Comments: R BKA Restrictions Weight Bearing Restrictions Per Provider Order: No     Mobility  Bed Mobility Overal bed mobility: Needs Assistance Bed Mobility: Supine to Sit, Rolling Rolling: Used rails, Supervision    Supine to sit: Min assist, HOB elevated, Used rails     General bed mobility comments: rolled with rails, increased time, cues for LE movement and assist at trunk to sit EOB    Transfers Overall transfer level: Needs assistance   Transfers: Sit to/from Stand, Bed to chair/wheelchair/BSC Sit to Stand: Mod assist, Min assist, +2 physical assistance          Lateral/Scoot Transfers: Mod assist, +2 physical assistance General transfer comment: pt able to stand with min-modA of 2 with use of RW, poor standing tolerance due to LLE wounds, modA of 2 to scoot with bed pad along EOB    Ambulation/Gait               General Gait Details: unable to complete due to pain in L foot wounds      Balance Overall balance assessment: Needs assistance Sitting-balance support: Feet supported, No upper extremity supported Sitting balance-Leahy Scale: Fair Sitting balance - Comments: fair/good seated at EOB   Standing balance support: Bilateral upper extremity supported, During functional activity, Reliant on assistive device for balance Standing balance-Leahy Scale: Poor Standing balance comment: using RW                            Cognition Arousal: Alert Behavior During Therapy: WFL for tasks assessed/performed Overall Cognitive Status: Within Functional Limits for tasks assessed  General Comments General comments (skin integrity, edema, etc.): VSS, pt reports limited in tolerance of wt bearing on LLE wounds      Pertinent Vitals/Pain Pain Assessment Pain Assessment: Faces Faces Pain Scale: Hurts a little bit Pain Location: R stump Pain Descriptors / Indicators: Discomfort, Sore Pain Intervention(s): Limited activity within patient's tolerance, Monitored during session, Repositioned     PT Goals (current goals can now be found in the care plan section) Acute Rehab PT Goals Patient Stated Goal: to go  to rehab PT Goal Formulation: With patient Time For Goal Achievement: 04/07/23 Potential to Achieve Goals: Fair Progress towards PT goals: Progressing toward goals    Frequency    Min 3X/week       AM-PAC PT 6 Clicks Mobility   Outcome Measure  Help needed turning from your back to your side while in a flat bed without using bedrails?: A Little Help needed moving from lying on your back to sitting on the side of a flat bed without using bedrails?: A Little Help needed moving to and from a bed to a chair (including a wheelchair)?: A Lot Help needed standing up from a chair using your arms (e.g., wheelchair or bedside chair)?: A Lot Help needed to walk in hospital room?: Total Help needed climbing 3-5 steps with a railing? : Total 6 Click Score: 12    End of Session Equipment Utilized During Treatment: Gait belt Activity Tolerance: Patient tolerated treatment well;Patient limited by fatigue;Patient limited by pain Patient left: with call bell/phone within reach;in bed;with bed alarm set (sitting EOB) Nurse Communication: Mobility status PT Visit Diagnosis: Unsteadiness on feet (R26.81);Other abnormalities of gait and mobility (R26.89);Muscle weakness (generalized) (M62.81)     Time: 8845-8787 PT Time Calculation (min) (ACUTE ONLY): 18 min  Charges:    $Therapeutic Exercise: 8-22 mins PT General Charges $$ ACUTE PT VISIT: 1 Visit                     Izetta Call, PT, DPT   Acute Rehabilitation Department Office [phone] Secure Chat Communication Preferred   Izetta JULIANNA Call 04/01/2023, 1:33 PM

## 2023-04-01 NOTE — Progress Notes (Signed)
 Patient ID: [name] Daune Raddle., male   DOB: [birth year], 53 y.o.   MRN: [id number] S: No events overnight. O:BP (!) 141/78 (BP Location: Right Arm)   Pulse (!) 58   Temp 98.3 F (36.8 C) (Oral)   Resp 16   Ht 6' 3 (1.905 m)   Wt 72.7 kg   SpO2 100%   BMI 20.03 kg/m  Exam: Gen: NAD CVS: RRR Resp:CTA Abd:+BS, soft, NT/ND Ext: no edema, s/p RBKA, LUE AVG +T/B  OP HD: [facility] TTS 3h  72kg  B400  LUA AVF  Heparin  1000+ 400 u/hr       Assessment/Plan:  # Gen weakness/ debility - patient/family unable to care for him at home and SNF has been recommended. This was admission diagnosis.   # ESRD - HD TTS. At [facility], but may need to change depending on SNF placement. HD tomorrow.   # Atrial fibrillation - on amio and coreg , midodrine  is ordered for BP support at 5 mg tid    # HTN/ volume - BP's are good, no vol excess on exam.   # Anemia of CKD - Hb 7- 9 range. No ESA given yet at outpt [facility] per staff - he just went back to [facility] after a SNF stay during which he was dialyzing w/ [facility]. We have started Aranesp  150 mcg every Tuesday sq while here.    # Secondary hyperparathyroidism - cont binders, sensipar  and calcitriol   # Hyperkalemia - persistent, will use 1K bath to help keep K+ down. Pt refusing renal diet, says he can't eat it, is getting regular diet.    # h/o R BKA - in dec 2024. May need surgical debridement by ortho here.        Myer Fret  MD  [facility] 04/01/2023, 10:13 AM  Recent Labs  Lab 03/29/23 1211 03/31/23 1055 03/31/23 1056 04/01/23 0924  HGB 7.7*  --  7.5*  --   ALBUMIN  2.7* 2.7*  --   --   CALCIUM  9.0 8.9  --  8.8*  PHOS 7.1* 5.2*  --   --   CREATININE 10.54* 9.02*  --  6.76*  K 6.0* 6.8*  --  5.9*    Inpatient medications:  (feeding supplement) PROSource Plus  30 mL Oral TID BM   amiodarone   200 mg Oral Daily   aspirin  EC  81 mg Oral Daily   atorvastatin   80 mg Oral QHS   calcitRIOL   2.25 mcg Oral Q T,Th,Sat-1800    calcium  acetate  1,334 mg Oral TID WC   carvedilol   12.5 mg Oral BID   Chlorhexidine  Gluconate Cloth  6 each Topical Q0600   cinacalcet   60 mg Oral Q T,Th,Sat-1800   darbepoetin (ARANESP ) injection - DIALYSIS  150 mcg Subcutaneous Q Tue-1800   dorzolamide -timolol   1 drop Right Eye BID   leptospermum manuka honey  1 Application Topical Daily   levETIRAcetam   500 mg Oral Once per day on Sunday Tuesday Thursday Saturday   levETIRAcetam   750 mg Oral Once per day on Monday Wednesday Friday   midodrine   5 mg Oral TID WC   multivitamin  1 tablet Oral QHS   nutrition supplement (JUVEN)  1 packet Oral BID BM   pneumococcal 20-valent conjugate vaccine  0.5 mL Intramuscular Tomorrow-1000    acetaminophen  **OR** acetaminophen , heparin , heparin , hydrALAZINE , lidocaine  (PF), lidocaine -prilocaine , loperamide , mouth rinse, oxyCODONE , pentafluoroprop-tetrafluoroeth

## 2023-04-01 NOTE — Plan of Care (Signed)

## 2023-04-02 DIAGNOSIS — N186 End stage renal disease: Secondary | ICD-10-CM | POA: Diagnosis not present

## 2023-04-02 DIAGNOSIS — I480 Paroxysmal atrial fibrillation: Secondary | ICD-10-CM | POA: Diagnosis not present

## 2023-04-02 DIAGNOSIS — I70261 Atherosclerosis of native arteries of extremities with gangrene, right leg: Secondary | ICD-10-CM | POA: Diagnosis not present

## 2023-04-02 DIAGNOSIS — R531 Weakness: Secondary | ICD-10-CM | POA: Diagnosis not present

## 2023-04-02 DIAGNOSIS — T8781 Dehiscence of amputation stump: Secondary | ICD-10-CM

## 2023-04-02 DIAGNOSIS — D638 Anemia in other chronic diseases classified elsewhere: Secondary | ICD-10-CM | POA: Diagnosis not present

## 2023-04-02 LAB — CBC WITH DIFFERENTIAL/PLATELET
Abs Immature Granulocytes: 0.02 10*3/uL (ref 0.00–0.07)
Basophils Absolute: 0.1 10*3/uL (ref 0.0–0.1)
Basophils Relative: 1 %
Eosinophils Absolute: 0.8 10*3/uL — ABNORMAL HIGH (ref 0.0–0.5)
Eosinophils Relative: 10 %
HCT: 26.5 % — ABNORMAL LOW (ref 39.0–52.0)
Hemoglobin: 7.8 g/dL — ABNORMAL LOW (ref 13.0–17.0)
Immature Granulocytes: 0 %
Lymphocytes Relative: 19 %
Lymphs Abs: 1.5 10*3/uL (ref 0.7–4.0)
MCH: 22.2 pg — ABNORMAL LOW (ref 26.0–34.0)
MCHC: 29.4 g/dL — ABNORMAL LOW (ref 30.0–36.0)
MCV: 75.5 fL — ABNORMAL LOW (ref 80.0–100.0)
Monocytes Absolute: 1 10*3/uL (ref 0.1–1.0)
Monocytes Relative: 13 %
Neutro Abs: 4.5 10*3/uL (ref 1.7–7.7)
Neutrophils Relative %: 57 %
Platelets: 187 10*3/uL (ref 150–400)
RBC: 3.51 MIL/uL — ABNORMAL LOW (ref 4.22–5.81)
RDW: 21 % — ABNORMAL HIGH (ref 11.5–15.5)
WBC: 7.8 10*3/uL (ref 4.0–10.5)
nRBC: 0 % (ref 0.0–0.2)

## 2023-04-02 LAB — BASIC METABOLIC PANEL
Anion gap: 14 (ref 5–15)
BUN: 94 mg/dL — ABNORMAL HIGH (ref 6–20)
CO2: 26 mmol/L (ref 22–32)
Calcium: 8.8 mg/dL — ABNORMAL LOW (ref 8.9–10.3)
Chloride: 98 mmol/L (ref 98–111)
Creatinine, Ser: 8.69 mg/dL — ABNORMAL HIGH (ref 0.61–1.24)
GFR, Estimated: 7 mL/min — ABNORMAL LOW (ref >60)
Glucose, Bld: 86 mg/dL (ref 70–99)
Potassium: 5.9 mmol/L — ABNORMAL HIGH (ref 3.5–5.1)
Sodium: 138 mmol/L (ref 135–145)

## 2023-04-02 MED ORDER — SODIUM ZIRCONIUM CYCLOSILICATE 10 G PO PACK
10.0000 g | PACK | Freq: Two times a day (BID) | ORAL | Status: AC
  Administered 2023-04-02: 10 g via ORAL
  Filled 2023-04-02: qty 1

## 2023-04-02 NOTE — Progress Notes (Signed)
 [facility]                                                                               [name], is a 53 y.o. male, DOB - Jul 17, 1970, FMW:[id number] Admit date - 03/24/2023    Outpatient Primary MD for the patient is [name] Gibney T, FNP  LOS - 0  days    Brief summary   [name]. is a 53 y.o. male with past medical history significant for ESRD on HD TTS, recent right BKA (Dr. Harden), CAD, CVA, paroxysmal atrial fibrillation not on anticoagulation due to history of ICH, gastroparesis, type 2 diabetes mellitus, diabetic retinopathy, glaucoma, history of left eye enucleation who presented to [name], ED on 1/30 via EMS from dialysis unit with complaints of increased blurriness, weakness, cough and difficulty managing at home.  Patient states symptoms worse with dialysis.  Recently discharged from the hospital to SNF for 3 weeks and has been home over the last 10 days.  Patient reports is unable to take care of himself due to weakness and spouse working all the time.  Requesting placement.  MR angiogram head without contrast with no intracranial large vessel occlusion or significant stenosis.  [name] discussed with ophthalmology on-call, Dr. [name] regarding his blurred vision in the setting of known diabetic retinopathy, glaucoma and history of left eye enucleation; ophthalmology recommended no other workup at this time and vision to be monitored and outpatient follow-up with his retina specialist.  [name] consulted for admission for further evaluation management of progressive weakness with TOC involvement for placement.   Therapy evaluations recommending SNF unfortunately insurance denied SNF authorization he is currently appealing the denial.   Assessment & Plan    Assessment and Plan:   Generalized weakness/deconditioning and gait disturbances With recent prolonged hospitalization followed by subacute rehab after right BKA by Dr. Harden on 02/09/2023.  Therapy  evaluations recommending SNF, insurance denied and patient is appealing the denial.    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 Harden.  Recently seen in outpatient office visit 03/22/2023. -- Cleanse stump wound and left heel wounds with Vahse, paint both areas with betadine , top with foam  -- Apply Medihoney to the stump wound, top with saline moist gauze, and kerlix with ACE wrap.  - plan for  right AKA on Wednesday.       Paroxysmal atrial fibrillation Rate controlled with amiodarone  and Coreg  Not on anticoagulation due to history of ICH.   Type 2 diabetes mellitus Control CBGs CBG (last 3)  Recent Labs    03/31/23 1440  GLUCAP 86      Seizure disorder Continue with Keppra  500 mg on Sunday Tuesday Thursday and Saturday and 750 mg daily on Monday Wednesday and Friday No new seizures since admission.   Anemia of chronic disease Repeat H&h in am.  Transfuse to keep hemoglobin greater than 8 prior to ortho surgery.    ESRD on dialysis on TTS Patient currently gets his dialysis at [facility] clinic nephrology on board and appreciate input.   Hyperkalemia  Lokelma  ordered, repeat BMP today.    Autonomic dysfunction Patient on midodrine  5 mg 3 times daily as needed      Pressure injury :  Pressure Injury 03/25/23 Thigh Right Unstageable - Full thickness tissue loss in which the base of the injury is covered by slough (yellow, tan, gray, green or brown) and/or eschar (tan, brown or black) in the wound bed. (Active)  03/25/23 1200  Location: Thigh  Location Orientation: Right  Staging: Unstageable - Full thickness tissue loss in which the base of the injury is covered by slough (yellow, tan, gray, green or brown) and/or eschar (tan, brown or black) in the wound bed.  Wound Description (Comments):   Present on Admission: Yes     Pressure Injury  03/25/23 Other (Comment) Right Unstageable - Full thickness tissue loss in which the base of the injury is covered by slough (yellow, tan, gray, green or brown) and/or eschar (tan, brown or black) in the wound bed. (Active)  03/25/23 1200  Location: Other (Comment) (stump)  Location Orientation: Right  Staging: Unstageable - Full thickness tissue loss in which the base of the injury is covered by slough (yellow, tan, gray, green or brown) and/or eschar (tan, brown or black) in the wound bed.  Wound Description (Comments):   Present on Admission: Yes     Pressure Injury 03/25/23 Heel Left;Lateral Stage 2 -  Partial thickness loss of dermis presenting as a shallow open injury with a red, pink wound bed without slough. (Active)  03/25/23 1200  Location: Heel  Location Orientation: Left;Lateral  Staging: Stage 2 -  Partial thickness loss of dermis presenting as a shallow open injury with a red, pink wound bed without slough.  Wound Description (Comments):   Present on Admission: Yes     Pressure Injury 03/25/23 Heel Left;Medial Deep Tissue Pressure Injury - Purple or maroon localized area of discolored intact skin or blood-filled blister due to damage of underlying soft tissue from pressure and/or shear. (Active)  03/25/23 1200  Location: Heel  Location Orientation: Left;Medial  Staging: Deep Tissue Pressure Injury - Purple or maroon localized area of discolored intact skin or blood-filled blister due to damage of underlying soft tissue from pressure and/or shear.  Wound Description (Comments):   Present on Admission: Yes       Estimated body mass index is 20.03 kg/m as calculated from the following:   Height as of this encounter: 6' 3 (1.905 m).   Weight as of this encounter: 72.7 kg.  Code Status: Full code DVT Prophylaxis:  SCDs Start: 03/24/23 2107   Level of Care: Level of care: Med-Surg Family Communication: None at bedside  Disposition Plan:     Remains inpatient  appropriate: Pending further evaluation by orthopedics  Procedures:  None  Consultants:   Nephrology.  Orthopedics Dr Harden.  Antimicrobials:   Anti-infectives (From admission, onward)    None        Medications  Scheduled Meds:  (feeding supplement) PROSource Plus  30 mL Oral TID BM   amiodarone   200 mg Oral Daily   aspirin  EC  81 mg Oral Daily   atorvastatin   80 mg Oral QHS   calcitRIOL   2.25 mcg Oral Q Macdonald,Th,Sat-1800   calcium  acetate  1,334 mg Oral TID WC   carvedilol   12.5 mg Oral BID   Chlorhexidine  Gluconate Cloth  6 each Topical Q0600   cinacalcet   60 mg Oral Q Macdonald,Th,Sat-1800   darbepoetin (ARANESP ) injection - DIALYSIS  150 mcg Subcutaneous  Q Tue-1800   dorzolamide -timolol   1 drop Right Eye BID   leptospermum manuka honey  1 Application Topical Daily   levETIRAcetam   500 mg Oral Once per day on Sunday Tuesday Thursday Saturday   levETIRAcetam   750 mg Oral Once per day on Monday Wednesday Friday   midodrine   5 mg Oral TID WC   multivitamin  1 tablet Oral QHS   mupirocin  ointment  1 Application Nasal BID   nutrition supplement (JUVEN)  1 packet Oral BID BM   pneumococcal 20-valent conjugate vaccine  0.5 mL Intramuscular Tomorrow-1000   Continuous Infusions: PRN Meds:.acetaminophen  **OR** acetaminophen , heparin , heparin , hydrALAZINE , lidocaine  (PF), lidocaine -prilocaine , loperamide , mouth rinse, oxyCODONE , pentafluoroprop-tetrafluoroeth    Subjective:   Haeden Hudock was seen and examined today. Sleeping comfortably, no new events overnight.   Objective:   Vitals:   04/01/23 1624 04/01/23 2005 04/02/23 0421 04/02/23 0900  BP: (!) 147/75 (!) 146/75 127/79 135/75  Pulse: 66 64 67 66  Resp: 18 17 18 17   Temp: 98.5 F (36.9 C) 98.4 F (36.9 C) 98.4 F (36.9 C) 98.2 F (36.8 C)  TempSrc: Oral Oral Oral Oral  SpO2: 99% 95% 96% 95%  Weight:      Height:        Intake/Output Summary (Last 24 hours) at 04/02/2023 0958 Last data filed at 04/01/2023  1729 Gross per 24 hour  Intake 120 ml  Output --  Net 120 ml   Filed Weights   03/29/23 1520 03/31/23 1044 03/31/23 1458  Weight: 72.1 kg 73.8 kg 72.7 kg     Exam General exam: Appears calm and comfortable  Respiratory system: Clear to auscultation. Respiratory effort normal. Cardiovascular system: S1 & S2 heard, RRR. Gastrointestinal system: Abdomen is nondistended, soft and nontender. Central nervous system: Alert and oriented.  Extremities: gangrenous changes at the transtibial amputation.  Skin: No rashes,   Data Reviewed:  I have personally reviewed following labs and imaging studies   CBC Lab Results  Component Value Date   WBC 6.5 03/31/2023   RBC 3.36 (L) 03/31/2023   HGB 7.5 (L) 03/31/2023   HCT 25.4 (L) 03/31/2023   MCV 75.6 (L) 03/31/2023   MCH 22.3 (L) 03/31/2023   PLT 183 03/31/2023   MCHC 29.5 (L) 03/31/2023   RDW 21.4 (H) 03/31/2023   LYMPHSABS 1.6 03/24/2023   MONOABS 0.5 03/24/2023   EOSABS 0.4 03/24/2023   BASOSABS 0.1 03/24/2023     Last metabolic panel Lab Results  Component Value Date   NA 138 04/01/2023   K 5.9 (H) 04/01/2023   CL 101 04/01/2023   CO2 26 04/01/2023   BUN 47 (H) 04/01/2023   CREATININE 6.76 (H) 04/01/2023   GLUCOSE 132 (H) 04/01/2023   GFRNONAA 9 (L) 04/01/2023   GFRAA 5 (L) 08/27/2019   CALCIUM  8.8 (L) 04/01/2023   PHOS 5.2 (H) 03/31/2023   PROT 6.4 (L) 03/24/2023   ALBUMIN  2.7 (L) 03/31/2023   LABGLOB 2.9 08/19/2016   AGRATIO 0.9 08/19/2016   BILITOT 0.7 03/24/2023   ALKPHOS 62 03/24/2023   AST 13 (L) 03/24/2023   ALT 14 03/24/2023   ANIONGAP 11 04/01/2023    CBG (last 3)  Recent Labs    03/31/23 1440  GLUCAP 86      Coagulation Profile: No results for input(s): INR, PROTIME in the last 168 hours.   Radiology Studies: No results found.     Elgie Butter M.D. [facility] 04/02/2023, 9:58 AM  Available via Epic secure chat 7am-7pm  After 7 pm, please refer to night coverage provider  listed on amion.

## 2023-04-02 NOTE — Progress Notes (Addendum)
 Went into patient's room,he was refusing his treatment,explained the importance of having hd tx today.Patient agreed this time. He is awake alert and oriented x 4.Consent verified.  Access used : Left upper arm AVF that worked well.  Duration of treatment : 2 hrs.  Net uf goal : Even.  Tolerated treatment: Yes.  He signed AMA treatment form.  Hand off to the patient's nurse: Back into his room with stable medical condition via transporter.

## 2023-04-02 NOTE — Plan of Care (Signed)

## 2023-04-02 NOTE — Progress Notes (Addendum)
 Subjective: Patient with some right leg discomfort and frustrated about his predicament with noted Dr. Harden consult recommended R AKA on Wednesday/for HD today/initially said was not going to dialysis but then said he would.  Also agrees to try renal diet in the setting of his ongoing hyperkalemia  Objective Vital signs in last 24 hours: Vitals:   04/01/23 1624 04/01/23 2005 04/02/23 0421 04/02/23 0900  BP: (!) 147/75 (!) 146/75 127/79 135/75  Pulse: 66 64 67 66  Resp: 18 17 18 17   Temp: 98.5 F (36.9 C) 98.4 F (36.9 C) 98.4 F (36.9 C) 98.2 F (36.8 C)  TempSrc: Oral Oral Oral Oral  SpO2: 99% 95% 96% 95%  Weight:      Height:       Weight change:   Physical Exam: General: Alert chronically ill male NAD Heart: RRR no MRG Lungs: CTA bilaterally nonlabored breathing Abdomen: NABS soft NT ND Extremities: No pedal edema, R BKA dressing dry clear, no pedal edema Dialysis Access:  LU A  AVG + bruit  OP HD: [facility] TTS 3h  72kg  B400  LUA AVF  Heparin  1000+ 400 u/hr  Problem/Plan: Right BKA with painful gangrenous wound= per Dr. Harden plans for right AKA Wednesday antibiotics per admit team/Dr. Harden ESRD -HD TTS/this a.m. said he did not want dialysis this a.m. did not want to stop it but after discussion agreed to 3 hours today, no excess volume keep even today General Weakness/debility= secondary #1 patient family unable to care for patient SNF recommended Hyperkalemia= using 1K bath acute K down, patient has been refusing renal diet, today tells me he will try Carb mod pheobe diet HTN/volume= no excess volume on exam / BP okay/on midodrine  5 mg 3 times daily Anemia -Hgb 7.5/no reported outpatient ESA/Aranesp  150 mcg q. Tuesday subcu while here Secondary hyperparathyroidism -corrected calcium  okay, phosphorus 5.2, on calcitriol , Sensipar , and calcium  acetate as binder Atrial fibrillation= on amiodarone  and Coreg  DM T2= per admit  [name], PA-C [facility] [phone] 04/02/2023,11:22 AM  LOS: 0 days   Labs: Basic Metabolic Panel: Recent Labs  Lab 03/29/23 1211 03/31/23 1055 04/01/23 0924  NA 135 139 138  K 6.0* 6.8* 5.9*  CL 99 103 101  CO2 22 24 26   GLUCOSE 75 89 132*  BUN 89* 62* 47*  CREATININE 10.54* 9.02* 6.76*  CALCIUM  9.0 8.9 8.8*  PHOS 7.1* 5.2*  --    Liver Function Tests: Recent Labs  Lab 03/29/23 1211 03/31/23 1055  ALBUMIN  2.7* 2.7*   No results for input(s): LIPASE, AMYLASE in the last 168 hours. No results for input(s): AMMONIA in the last 168 hours. CBC: Recent Labs  Lab 03/29/23 1211 03/31/23 1056  WBC 6.0 6.5  HGB 7.7* 7.5*  HCT 25.4* 25.4*  MCV 75.1* 75.6*  PLT 181 183   Cardiac Enzymes: No results for input(s): CKTOTAL, CKMB, CKMBINDEX, TROPONINI in the last 168 hours. CBG: Recent Labs  Lab 03/31/23 1440  GLUCAP 86    Studies/Results: No results found. Medications:   (feeding supplement) PROSource Plus  30 mL Oral TID BM   amiodarone   200 mg Oral Daily   aspirin  EC  81 mg Oral Daily   atorvastatin   80 mg Oral QHS   calcitRIOL   2.25 mcg Oral Q T,Th,Sat-1800   calcium  acetate  1,334 mg Oral TID WC   carvedilol   12.5 mg Oral BID   Chlorhexidine  Gluconate Cloth  6 each Topical Q0600  cinacalcet   60 mg Oral Q T,Th,Sat-1800   darbepoetin (ARANESP ) injection - DIALYSIS  150 mcg Subcutaneous Q Tue-1800   dorzolamide -timolol   1 drop Right Eye BID   leptospermum manuka honey  1 Application Topical Daily   levETIRAcetam   500 mg Oral Once per day on Sunday Tuesday Thursday Saturday   levETIRAcetam   750 mg Oral Once per day on Monday Wednesday Friday   midodrine   5 mg Oral TID WC   multivitamin  1 tablet Oral QHS   mupirocin  ointment  1 Application Nasal BID   nutrition supplement (JUVEN)  1 packet Oral BID BM   pneumococcal 20-valent conjugate vaccine  0.5 mL Intramuscular Tomorrow-1000

## 2023-04-02 NOTE — Consult Note (Signed)
 ORTHOPAEDIC CONSULTATION  REQUESTING PHYSICIAN: Cherlyn Labella, MD  Chief Complaint: Painful gangrenous ulceration right below-knee amputation.  HPI: [name] Eyad Rochford. is a 53 y.o. male who presents with gangrenous changes and pain right below-knee amputation.  Patient is 2 months status post right transtibial amputation.  Patient is also status post endovascular evaluation which shows no stenotic lesions.  Patient had significant microvascular disease.  Past Medical History:  Diagnosis Date   Acute Bil  CVAs of Bil Frontal amd Rt Parietal  in Watershed Distribution 04/21/2021   Patchy acute cortically-based infarcts within the bilateral high  frontal lobes and right parietal lobe (predominantly in a watershed  distribution).   Subcentimeter acute infarct within the callosal splenium on the  left.     Acute bilateral cerebral infarction in a watershed distribution [facility]) 04/21/2021   a.) MRI brain 04/21/2021 --> patchy ACUTE cortically-based infarcts within the bilateral high frontal lobes and right parietal lobe   Acute cerebral infarction (HCC) 02/13/2021   a.) MRI brain 02/13/2021: acute LEFT hippocampal infarct   Acute cerebral infarction (HCC) 04/21/2021   a.) MRI brain 04/21/2021: subcentimeter ACUTE infarct within the callosal splenium on the LEFT   Acute osteomyelitis of toe of right foot with gangrene (HCC) 02/26/2022   Anaphylactic reaction due to adverse effect of correct drug or medicament properly administered, initial encounter 05/08/2019   Anemia of chronic renal failure    Anxiety    Aortic dilatation (HCC) 11/25/2020   a.) TTE 11/25/2020: Ao root measured 38 mm. b.) TTE 04/22/2021: Ao root measured 44 mm; c. 11/2021 Echo: Ao root 40mm.   Atrial fibrillation (HCC)    a.) CHA2DS2-VASc = 6 (CHF, HTN, CVA x2, prior MI, T2DM). b.) rate/rhythm maintained on oral amiodarone  + carvedilol ; chronic OAC/AP therapy discontinued following ICH   Benign prostatic hyperplasia with  lower urinary tract symptoms 11/07/2019   BPH (benign prostatic hyperplasia)    Cardiac arrest (HCC) 07/26/2021   a.) in setting of hyperkalemia, NSTEMI, and seizure following missed HD; pulseless and apneic --> required 1 round of CPR prior to ROSC   Cellulitis    Cerebral hemorrhage (HCC) 11/21/2021   a.) CT head 11/21/2021 --> 1.4 x 1.5 x 3.1 cm ACUTE hemorrhage within the inferior aspect of the fourth ventricle, and extending inferiorly through the foramen of foramen of [place] --> occurred in setting of HTN emergency and DOAC/APT-->eliquis /plavix  d/c'd.   Cerebral microvascular disease    Chronic diarrhea    Chronic heart failure with preserved ejection fraction (HFpEF) (HCC) 07/19/2017   a.) 06/2017 Echo: EF 75%; b.) 11/2020 Echo: EF 55-60%; c.) 04/2021 Echo: EF 55-60%; d.) 11/2021 Echo: EF 65-70%, GrII DD, nl RV fxn, sev dil LA, large circumferential pericardial eff w/o tamponade, triv MR, AoV sclerosis. Ao root 40mm; e.) TTE 02/28/2025: EF 60-65%, sev LVH, mod posterior LV effusion, AoV sclerosis   Chronic pain of both ankles    Coronary artery disease 02/26/2020   a. 02/2020 Ant STEMI/PCI: LM nl, LAD 50m (3.5x30 Resolute Onyx), RI nl, LCX min irregs, RCA min irregs. EF 65%.   DDD (degenerative disc disease), cervical    Diabetes mellitus, type II (HCC)    Diabetic neuropathy (HCC)    Esophagitis    ESRD (end stage renal disease) on dialysis (HCC)    a.) [facility]; T-Th-Sat   Frequent falls    Gait instability    Gastritis    Gastroparesis    GERD (gastroesophageal reflux disease)    H/O enucleation of  left eyeball    Heart palpitations    History of kidney stones    HLD (hyperlipidemia)    Hypertension    Insomnia    a.) uses melatonin PRN   Long term current use of amiodarone     Nausea and vomiting in adult    recurrent   NSTEMI (non-ST elevated myocardial infarction) (HCC) 07/26/2021   a.) hyperkalemic from missed HD; seizures; decompensated to cardiac arrest and required  CPR x 1 round prior to ROSC.   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  Added automatically from request for surgery [id number]     Osteomyelitis [facility])    Pericardial effusion    a. 11/2021 Echo: EF 65-70%, GrII DD, nl RV fxn, sev dil LA, large circumferential pericardial eff w/o tamponade, triv MR, AoV sclerosis. Ao root 40mm.   Seizure (HCC)    a.) last 07/26/2021 in setting of missed HD --> hyperkalemic at 6.5 --> pulseless/apneic and required CPR; discharged home on levetiracetam .   ST elevation myocardial infarction (STEMI) of anterior wall (HCC) 02/26/2020   a.) LHC/PCI 02/26/2020 --> EF 65%; LVEDP 11 mmHg; 90% mLAD (3.5 x 20 mm Resolute Onyx DES x 1)   Past Surgical History:  Procedure Laterality Date   A/V FISTULAGRAM Left 08/31/2021   Procedure: A/V Fistulagram;  Surgeon: Marea Selinda RAMAN, MD;  Location: [name] INVASIVE CV LAB;  Service: Cardiovascular;  Laterality: Left;   A/V FISTULAGRAM Left 01/25/2022   Procedure: A/V Fistulagram;  Surgeon: Marea Selinda RAMAN, MD;  Location: [name] INVASIVE CV LAB;  Service: Cardiovascular;  Laterality: Left;   A/V FISTULAGRAM Right 10/18/2022   Procedure: A/V Fistulagram;  Surgeon: Marea Selinda RAMAN, MD;  Location: [name] INVASIVE CV LAB;  Service: Cardiovascular;  Laterality: Right;   ABDOMINAL AORTOGRAM W/LOWER EXTREMITY Bilateral 02/08/2023   Procedure: ABDOMINAL AORTOGRAM W/LOWER EXTREMITY;  Surgeon: Pearline Norman RAMAN, MD;  Location: [facility] INVASIVE CV LAB;  Service: Vascular;  Laterality: Bilateral;   AMPUTATION Left 03/16/2016   Procedure: AMPUTATION DIGIT LEFT HALLUX;  Surgeon: Donnice JONELLE Fees, DPM;  Location: [facility] OR;  Service: Podiatry;  Laterality: Left;  can start around 5    AMPUTATION Right 02/09/2021   Procedure: AMPUTATION DIGIT;  Surgeon: Romona Harari, MD;  Location: [facility] OR;  Service: Orthopedics;  Laterality: Right;   AMPUTATION Right 02/04/2023   Procedure:  AMPUTATION 4TH AND 5TH METATARSAL;  Surgeon: Malvin Marsa FALCON, DPM;  Location: [facility] OR;  Service: Orthopedics/Podiatry;  Laterality: Right;  I&D, partial ray resection, abx beads   AMPUTATION Right 02/09/2023   Procedure: RIGHT BELOW KNEE AMPUTATION;  Surgeon: Harden Jerona GAILS, MD;  Location[facility] OR;  Service: Orthopedics;  Laterality: Right;   AMPUTATION TOE Right 02/28/2022   Procedure: AMPUTATION TOE;  Surgeon: Joya Stabs, DPM;  Location: [name] ORS;  Service: Podiatry;  Laterality: Right;  right fifth toe   ARTHROSCOPIC REPAIR ACL Left    AV FISTULA PLACEMENT Right 05/31/2019   Procedure: Brachiocephalic AV fistula creation;  Surgeon: Marea Selinda RAMAN, MD;  Location: [name] ORS;  Service: Vascular;  Laterality: Right;   AV FISTULA PLACEMENT Left 08/13/2021   Procedure: ARTERIOVENOUS (AV) FISTULA CREATION (BRACHIALCEPHALIC);  Surgeon: Marea Selinda RAMAN, MD;  Location: [name] ORS;  Service: Vascular;  Laterality: Left;   COLONOSCOPY WITH PROPOFOL  N/A 10/28/2015   Procedure: COLONOSCOPY WITH PROPOFOL ;  Surgeon: Gladis RAYMOND Mariner, MD;  Location: [facility] ENDOSCOPY;  Service: Endoscopy;  Laterality: N/A;   COLONOSCOPY WITH  PROPOFOL  N/A 10/29/2015   Procedure: COLONOSCOPY WITH PROPOFOL ;  Surgeon: Gladis RAYMOND Mariner, MD;  Location: [facility] ENDOSCOPY;  Service: Endoscopy;  Laterality: N/A;   COLONOSCOPY WITH PROPOFOL  N/A 01/27/2023   Procedure: COLONOSCOPY WITH PROPOFOL ;  Surgeon: Cinderella Deatrice FALCON, MD;  Location: [facility] ENDO SUITE;  Service: Endoscopy;  Laterality: N/A;  12:30pm, asa 3   CORONARY ULTRASOUND/IVUS N/A 02/26/2020   Procedure: Intravascular Ultrasound/IVUS;  Surgeon: Dann Candyce RAMAN, MD;  Location: [facility] INVASIVE CV LAB;  Service: Cardiovascular;  Laterality: N/A;   CORONARY/GRAFT ACUTE MI REVASCULARIZATION N/A 02/26/2020   Procedure: Coronary/Graft Acute MI Revascularization;  Surgeon: Dann Candyce RAMAN, MD;  Location: [facility] INVASIVE CV LAB;  Service: Cardiovascular;  Laterality: N/A;   DIALYSIS/PERMA  CATHETER INSERTION Right 04/26/2019   Perm Cath    DIALYSIS/PERMA CATHETER INSERTION N/A 04/26/2019   Procedure: DIALYSIS/PERMA CATHETER INSERTION;  Surgeon: Marea Selinda RAMAN, MD;  Location: [name] INVASIVE CV LAB;  Service: Cardiovascular;  Laterality: N/A;   DIALYSIS/PERMA CATHETER INSERTION N/A 05/25/2021   Procedure: DIALYSIS/PERMA CATHETER INSERTION;  Surgeon: Marea Selinda RAMAN, MD;  Location: [name] INVASIVE CV LAB;  Service: Cardiovascular;  Laterality: N/A;   DIALYSIS/PERMA CATHETER INSERTION N/A 08/31/2021   Procedure: DIALYSIS/PERMA CATHETER INSERTION;  Surgeon: Marea Selinda RAMAN, MD;  Location: [name] INVASIVE CV LAB;  Service: Cardiovascular;  Laterality: N/A;   DIALYSIS/PERMA CATHETER REMOVAL N/A 08/15/2019   Procedure: DIALYSIS/PERMA CATHETER REMOVAL;  Surgeon: Jama Cordella MATSU, MD;  Location: [name] INVASIVE CV LAB;  Service: Cardiovascular;  Laterality: N/A;   DIALYSIS/PERMA CATHETER REMOVAL N/A 03/15/2022   Procedure: DIALYSIS/PERMA CATHETER REMOVAL;  Surgeon: Marea Selinda RAMAN, MD;  Location: [name] INVASIVE CV LAB;  Service: Cardiovascular;  Laterality: N/A;   EMBOLIZATION (CATH LAB) Right 02/06/2021   Procedure: EMBOLIZATION;  Surgeon: Marea Selinda RAMAN, MD;  Location: [name] INVASIVE CV LAB;  Service: Cardiovascular;  Laterality: Right;  Right Upper Extremity Dialysis Access, Permcath Placement   ESOPHAGOGASTRODUODENOSCOPY (EGD) WITH PROPOFOL  N/A 12/27/2017   Procedure: ESOPHAGOGASTRODUODENOSCOPY (EGD) WITH PROPOFOL ;  Surgeon: Toledo, Ladell POUR, MD;  Location: [name] ENDOSCOPY;  Service: Gastroenterology;  Laterality: N/A;   EYE SURGERY     LEFT HEART CATH AND CORONARY ANGIOGRAPHY N/A 02/26/2020   Procedure: LEFT HEART CATH AND CORONARY ANGIOGRAPHY;  Surgeon: Dann Candyce RAMAN, MD;  Location: [facility] INVASIVE CV LAB;  Service: Cardiovascular;  Laterality: N/A;   PROSTATE SURGERY  2016   REVISON OF ARTERIOVENOUS FISTULA Right 07/22/2022   Procedure: RESECTION OF ANEURYSMAL RIGHT ARM ARTERIOVENOUS FISTULA;  Surgeon:  Marea Selinda RAMAN, MD;  Location: [name] ORS;  Service: Vascular;  Laterality: Right;   TONSILECTOMY/ADENOIDECTOMY WITH MYRINGOTOMY     TONSILLECTOMY     UPPER EXTREMITY ANGIOGRAPHY Right 11/26/2020   Procedure: Upper Extremity Angiography;  Surgeon: Marea Selinda RAMAN, MD;  Location: [name] INVASIVE CV LAB;  Service: Cardiovascular;  Laterality: Right;   UPPER EXTREMITY ANGIOGRAPHY Right 02/05/2021   Procedure: UPPER EXTREMITY ANGIOGRAPHY;  Surgeon: Marea Selinda RAMAN, MD;  Location: [name] INVASIVE CV LAB;  Service: Cardiovascular;  Laterality: Right;   Social History   Socioeconomic History   Marital status: Married    Spouse name: Samule    Number of children: 2   Years of education: Not on file   Highest education level: High school graduate  Occupational History   Occupation: Disability    Comment: not employed  Tobacco Use   Smoking status: Never   Smokeless tobacco: Never  Vaping Use   Vaping status: Never Used  Substance and Sexual Activity   Alcohol   use: No   Drug use: No   Sexual activity: Yes  Other Topics Concern   Not on file  Social History Narrative   Oldest son killed in car crash June 2020.  Lives in [place] with his wife.   Social Drivers of Health   Financial Resource Strain: High Risk (07/05/2017)   Overall Financial Resource Strain (CARDIA)    Difficulty of Paying Living Expenses: Hard  Food Insecurity: Patient Declined (03/31/2023)   Hunger Vital Sign    Worried About Running Out of Food in the Last Year: Patient declined    Ran Out of Food in the Last Year: Patient declined  Transportation Needs: Patient Declined (03/31/2023)   PRAPARE - Administrator, Civil Service (Medical): Patient declined    Lack of Transportation (Non-Medical): Patient declined  Physical Activity: Inactive (07/05/2017)   Exercise Vital Sign    Days of Exercise per Week: 0 days    Minutes of Exercise per Session: 0 min  Stress: Stress Concern Present (07/05/2017)   [employer] - Occupational Stress Questionnaire    Feeling of Stress : Rather much  Social Connections: Unknown (03/31/2023)   Social Connection and Isolation Panel [NHANES]    Frequency of Communication with Friends and Family: Patient declined    Frequency of Social Gatherings with Friends and Family: Patient declined    Attends Religious Services: Not on Marketing Executive or Organizations: Patient declined    Attends Banker Meetings: Patient declined    Marital Status: Patient declined   Family History  Problem Relation Age of Onset   CAD Father    Stroke Father    Diabetes Mellitus II Mother    Kidney failure Mother    Schizophrenia Mother    - negative except otherwise stated in the family history section Allergies  Allergen Reactions   Promethazine  Diarrhea and Other (See Comments)    Muscle cramps, cramping all over   Prior to Admission medications   Medication Sig Start Date End Date Taking? Authorizing Provider  acetaminophen  (TYLENOL ) 325 MG tablet Take 1-2 tablets (325-650 mg total) by mouth every 4 (four) hours as needed for mild pain. 05/04/21  Yes Love, Sharlet RAMAN, PA-C  amiodarone  (PACERONE ) 200 MG tablet Take 1 tablet (200 mg total) by mouth every morning. 08/02/22 08/03/23 Yes Dann Candyce RAMAN, MD  atorvastatin  (LIPITOR ) 80 MG tablet TAKE ONE TABLET BY MOUTH AT BEDTIME. 03/22/23  Yes Wyn Jackee VEAR Mickey., NP  calcitRIOL  (ROCALTROL ) 0.25 MCG capsule Take 9 capsules (2.25 mcg total) by mouth every Tuesday, Thursday, and Saturday at 6 PM. 02/24/23  Yes Danford, Lonni SQUIBB, MD  calcium  acetate (PHOSLO ) 667 MG capsule Take 2 capsules (1,334 mg total) by mouth 3 (three) times daily with meals. 05/04/21  Yes Love, Sharlet RAMAN, PA-C  carvedilol  (COREG ) 12.5 MG tablet Take 1 tablet (12.5 mg total) by mouth 2 (two) times daily. 02/23/23  Yes Danford, Lonni SQUIBB, MD  cinacalcet  (SENSIPAR ) 30 MG tablet Take 2 tablets (60 mg total) by  mouth every Tuesday, Thursday, and Saturday at 6 PM. 02/24/23  Yes Danford, Lonni SQUIBB, MD  dorzolamide -timolol  (COSOPT ) 2-0.5 % ophthalmic solution Place 1 drop into the right eye 2 (two) times daily. 11/15/22 11/15/23 Yes [provider]  hydrocortisone  (ANUSOL -HC) 2.5 % rectal cream Place 1 Application rectally 2 (two) times daily. 10/05/22  Yes Leath-Warren, Etta PARAS, NP  levETIRAcetam  (KEPPRA ) 500 MG tablet Take 1  tablet (500 mg total) by mouth 4 (four) times a week. On Sunday, Monday, Wednesday and Friday, all non-dialysis days 02/25/23  Yes Danford, Lonni SQUIBB, MD  levETIRAcetam  (KEPPRA ) 750 MG tablet Take 1 tablet (750 mg total) by mouth 3 (three) times a week. On Tuesday Thursday and Saturday, on dialysis days 02/24/23  Yes Danford, Lonni SQUIBB, MD  lidocaine -prilocaine  (EMLA ) cream Apply 1 Application topically 3 (three) times a week. 12/24/21  Yes [provider]  loperamide  (IMODIUM ) 2 MG capsule Take 1 capsule (2 mg total) by mouth as needed for diarrhea or loose stools. 02/23/23  Yes Danford, Lonni SQUIBB, MD  midodrine  (PROAMATINE ) 5 MG tablet Take 1 tablet (5 mg total) by mouth 3 (three) times daily with meals. 02/23/23  Yes Danford, Lonni SQUIBB, MD  nutrition supplement, JUVEN, (JUVEN) PACK Take 1 packet by mouth 2 (two) times daily between meals. 02/23/23  Yes Danford, Lonni SQUIBB, MD  Nutritional Supplements (,FEEDING SUPPLEMENT, PROSOURCE PLUS) liquid Take 30 mLs by mouth 3 (three) times daily between meals. 02/23/23  Yes Danford, Lonni SQUIBB, MD  ondansetron  (ZOFRAN ) 4 MG tablet Take 1 tablet (4 mg total) by mouth every 6 (six) hours as needed for nausea or vomiting. 03/17/23  Yes Ruthell Lonni FALCON, PA-C   No results found. - pertinent xrays, CT, MRI studies were reviewed and independently interpreted  Positive ROS: All other systems have been reviewed and were otherwise negative with the exception of those mentioned in the HPI and as above.  Physical  Exam: General: Alert, no acute distress Psychiatric: Patient is competent for consent with normal mood and affect Lymphatic: No axillary or cervical lymphadenopathy Cardiovascular: No pedal edema Respiratory: No cyanosis, no use of accessory musculature GI: No organomegaly, abdomen is soft and non-tender    Images:  @ENCIMAGES @  Labs:  Lab Results  Component Value Date   HGBA1C 4.5 (L) 11/21/2021   HGBA1C 4.7 (L) 04/21/2021   HGBA1C 5.7 (H) 02/13/2021   ESRSEDRATE 9 02/27/2022   ESRSEDRATE 97 (H) 02/05/2021   ESRSEDRATE 22 (H) 06/24/2015   CRP <0.5 02/27/2022   CRP 3.6 (H) 02/05/2021   CRP 0.6 06/24/2015   REPTSTATUS 02/09/2023 FINAL 02/04/2023   REPTSTATUS 02/09/2023 FINAL 02/04/2023   GRAMSTAIN  02/04/2023    FEW WBC PRESENT, PREDOMINANTLY PMN ABUNDANT GRAM POSITIVE COCCI IN PAIRS RARE GRAM NEGATIVE COCCOBACILLI    GRAMSTAIN  02/04/2023    MODERATE WBC PRESENT, PREDOMINANTLY PMN MODERATE GRAM POSITIVE COCCI    CULT  02/04/2023    ABUNDANT STAPHYLOCOCCUS AUREUS MODERATE CORYNEBACTERIUM STRIATUM Standardized susceptibility testing for this organism is not available. NO ANAEROBES ISOLATED Performed at [facility], [address], [place], [place] [address]    CULT  02/04/2023    ABUNDANT STAPHYLOCOCCUS AUREUS SUSCEPTIBILITIES PERFORMED ON PREVIOUS CULTURE WITHIN THE LAST 5 DAYS. MODERATE CORYNEBACTERIUM STRIATUM Standardized susceptibility testing for this organism is not available. NO ANAEROBES ISOLATED Performed at [facility], [address], [place], [place] [address]    [facility] STAPHYLOCOCCUS AUREUS 02/04/2023    Lab Results  Component Value Date   ALBUMIN  2.7 (L) 03/31/2023   ALBUMIN  2.7 (L) 03/29/2023   ALBUMIN  2.9 (L) 03/26/2023   PREALBUMIN 22 02/27/2022        Latest Ref Rng & Units 03/31/2023   10:56 AM 03/29/2023   12:11 PM 03/26/2023    5:26 AM  CBC EXTENDED  WBC 4.0 - 10.5 K/uL 6.5  6.0  7.2   RBC 4.22 - 5.81 MIL/uL  3.36   3.38  3.63    3.63   Hemoglobin 13.0 - 17.0 g/dL 7.5  7.7  7.8   HCT 60.9 - 52.0 % 25.4  25.4  27.4   Platelets 150 - 400 K/uL 183  181  182     Neurologic: Patient does not have protective sensation bilateral lower extremities.   MUSCULOSKELETAL:   Skin: Examination patient has pain to touch to the right residual limb.  There is gangrenous changes circumferentially to the right transtibial amputation.  The tissue has decreased turgor.  Examination of the left foot patient does have some mild ischemic changes to the heel.  There is no full-thickness skin defect.  There is some maceration from drainage.  White cell count 6.5 with a hemoglobin of 7.5.  Most recent sed rate 9 with a C-reactive protein less than 0.5.  Assessment: Assessment: Peripheral vascular disease with painful gangrenous changes to the right transtibial amputation.  Plan: Plan: Will plan for a right above-knee amputation on Wednesday.  Will order a protective boot for the left lower extremity.  Thank you for the consult and the opportunity to see Mr. Amarie Tarte, MD [facility] [phone] 9:46 AM

## 2023-04-03 ENCOUNTER — Observation Stay (HOSPITAL_COMMUNITY): Payer: Medicare HMO

## 2023-04-03 DIAGNOSIS — R531 Weakness: Secondary | ICD-10-CM | POA: Diagnosis not present

## 2023-04-03 DIAGNOSIS — D638 Anemia in other chronic diseases classified elsewhere: Secondary | ICD-10-CM | POA: Diagnosis not present

## 2023-04-03 DIAGNOSIS — N186 End stage renal disease: Secondary | ICD-10-CM | POA: Diagnosis not present

## 2023-04-03 DIAGNOSIS — I480 Paroxysmal atrial fibrillation: Secondary | ICD-10-CM | POA: Diagnosis not present

## 2023-04-03 LAB — BASIC METABOLIC PANEL
Anion gap: 11 (ref 5–15)
BUN: 68 mg/dL — ABNORMAL HIGH (ref 6–20)
CO2: 27 mmol/L (ref 22–32)
Calcium: 8.6 mg/dL — ABNORMAL LOW (ref 8.9–10.3)
Chloride: 99 mmol/L (ref 98–111)
Creatinine, Ser: 6.56 mg/dL — ABNORMAL HIGH (ref 0.61–1.24)
GFR, Estimated: 9 mL/min — ABNORMAL LOW (ref >60)
Glucose, Bld: 106 mg/dL — ABNORMAL HIGH (ref 70–99)
Potassium: 4.4 mmol/L (ref 3.5–5.1)
Sodium: 137 mmol/L (ref 135–145)

## 2023-04-03 LAB — CBC WITH DIFFERENTIAL/PLATELET
Abs Immature Granulocytes: 0.03 10*3/uL (ref 0.00–0.07)
Basophils Absolute: 0.1 10*3/uL (ref 0.0–0.1)
Basophils Relative: 1 %
Eosinophils Absolute: 0.6 10*3/uL — ABNORMAL HIGH (ref 0.0–0.5)
Eosinophils Relative: 9 %
HCT: 24.4 % — ABNORMAL LOW (ref 39.0–52.0)
Hemoglobin: 7.1 g/dL — ABNORMAL LOW (ref 13.0–17.0)
Immature Granulocytes: 1 %
Lymphocytes Relative: 27 %
Lymphs Abs: 1.8 10*3/uL (ref 0.7–4.0)
MCH: 21.8 pg — ABNORMAL LOW (ref 26.0–34.0)
MCHC: 29.1 g/dL — ABNORMAL LOW (ref 30.0–36.0)
MCV: 74.8 fL — ABNORMAL LOW (ref 80.0–100.0)
Monocytes Absolute: 0.8 10*3/uL (ref 0.1–1.0)
Monocytes Relative: 11 %
Neutro Abs: 3.4 10*3/uL (ref 1.7–7.7)
Neutrophils Relative %: 51 %
Platelets: 179 10*3/uL (ref 150–400)
RBC: 3.26 MIL/uL — ABNORMAL LOW (ref 4.22–5.81)
RDW: 21.5 % — ABNORMAL HIGH (ref 11.5–15.5)
WBC: 6.6 10*3/uL (ref 4.0–10.5)
nRBC: 0 % (ref 0.0–0.2)

## 2023-04-03 LAB — PREPARE RBC (CROSSMATCH)

## 2023-04-03 LAB — GLUCOSE, CAPILLARY: Glucose-Capillary: 75 mg/dL (ref 70–99)

## 2023-04-03 MED ORDER — METHOCARBAMOL 1000 MG/10ML IJ SOLN
500.0000 mg | Freq: Three times a day (TID) | INTRAMUSCULAR | Status: DC | PRN
  Administered 2023-04-03 – 2023-04-07 (×3): 500 mg via INTRAVENOUS
  Filled 2023-04-03 (×4): qty 10

## 2023-04-03 MED ORDER — DIPHENHYDRAMINE HCL 25 MG PO CAPS
25.0000 mg | ORAL_CAPSULE | Freq: Once | ORAL | Status: AC | PRN
  Administered 2023-04-03: 25 mg via ORAL
  Filled 2023-04-03: qty 1

## 2023-04-03 MED ORDER — MIDODRINE HCL 5 MG PO TABS
5.0000 mg | ORAL_TABLET | Freq: Three times a day (TID) | ORAL | Status: DC | PRN
  Administered 2023-04-08: 5 mg via ORAL
  Filled 2023-04-03: qty 1

## 2023-04-03 MED ORDER — LIDOCAINE-PRILOCAINE 2.5-2.5 % EX CREA
1.0000 | TOPICAL_CREAM | CUTANEOUS | Status: DC
Start: 2023-04-04 — End: 2023-04-14
  Administered 2023-04-05: 1 via TOPICAL
  Filled 2023-04-03: qty 5

## 2023-04-03 MED ORDER — SODIUM CHLORIDE 0.9% IV SOLUTION: Freq: Once | INTRAVENOUS | Status: AC

## 2023-04-03 MED ORDER — DEXTROSE 50 % IV SOLN
INTRAVENOUS | Status: AC
  Administered 2023-04-03: 25 mL
  Filled 2023-04-03: qty 50

## 2023-04-03 NOTE — Progress Notes (Signed)
 Upon entering room, pt. Sleeping, attempted to wake up pt. Difficult to arouse, not answering questions appropriately, opens eyes with verbal stimuli and nods head yes when asked if he was ok, pt. Sweaty, blood glucose 75, withdraws with painful stimuli, VS per FS, respirations easy and unlabored, RR response and MD aware, wife at bedside, awaiting further orders  Doyal Sias

## 2023-04-03 NOTE — Plan of Care (Signed)
   Problem: Education: Goal: Knowledge of General Education information will improve Description: Including pain rating scale, medication(s)/side effects and non-pharmacologic comfort measures Outcome: Completed/Met

## 2023-04-03 NOTE — Progress Notes (Signed)
 Subjective: Seen in room with wife present, noted earlier a.m. episode of confusion/difficult to arouse per RN noted received Benadryl  earlier per patient and wife rarely used this.  Currently completely oriented only complaint is of right BKA discomfort and requesting earlier right AKA surgery then Wednesday  Objective Vital signs in last 24 hours: Vitals:   04/02/23 1823 04/02/23 2106 04/03/23 0433 04/03/23 0737  BP: (!) 173/79 (!) 142/75 132/65 (!) 156/81  Pulse: 66 72 65 60  Resp: 18 19 18 18   Temp: 98 F (36.7 C) 98.6 F (37 C) 98.2 F (36.8 C) 98 F (36.7 C)  TempSrc: Oral Oral Oral Oral  SpO2: 98% 97% 98% 100%  Weight:      Height:       Weight change:   Physical Exam: General: Alert chronically ill male NAD Heart: RRR no MRG Lungs: CTA bilaterally nonlabored breathing Abdomen: NABS soft NT ND Extremities: No pedal edema, R BKA dressing dry clear, no pedal edema Dialysis Access:  LU A  AVG + bruit   OP HD: [facility] TTS 3h  72kg  B400  LUA AVF  Heparin  1000+ 400 u/hr   Problem/Plan: Right BKA with painful gangrenous wound= per Dr. Harden plans for right AKA Wednesday antibiotics per admit team/Dr. Harden ESRD -HD TTS/patient HD yest.  After refusing then reconsidering General Weakness/debility= secondary #1 patient family unable to care for patient SNF recommended Hyperkalemia= yesterday used 1K bath am  k = 4.4, patient has been refusing renal diet, yesterday told me would try Carb mod pheobe diet/also per RN wife bringing food from outside HTN/volume= no excess volume on exam / BP okay/on midodrine  5 mg 3 times daily Anemia -Hgb 7.1 /no reported outpatient ESA/given Aranesp  150 mcg 02/04  q Tues. subcu while here Secondary hyperparathyroidism -corrected calcium  okay, phosphorus 5.2, on calcitriol , Sensipar , and calcium  acetate as binder Atrial fibrillation= on amiodarone  and Coreg  DM T2= per admit  [name], PA-C [facility]  [phone] 04/03/2023,9:32 AM  LOS: 0 days   Labs: Basic Metabolic Panel: Recent Labs  Lab 03/29/23 1211 03/31/23 1055 04/01/23 0924 04/02/23 1438 04/03/23 0151  NA 135 139 138 138 137  K 6.0* 6.8* 5.9* 5.9* 4.4  CL 99 103 101 98 99  CO2 22 24 26 26 27   GLUCOSE 75 89 132* 86 106*  BUN 89* 62* 47* 94* 68*  CREATININE 10.54* 9.02* 6.76* 8.69* 6.56*  CALCIUM  9.0 8.9 8.8* 8.8* 8.6*  PHOS 7.1* 5.2*  --   --   --    Liver Function Tests: Recent Labs  Lab 03/29/23 1211 03/31/23 1055  ALBUMIN  2.7* 2.7*   No results for input(s): LIPASE, AMYLASE in the last 168 hours. No results for input(s): AMMONIA in the last 168 hours. CBC: Recent Labs  Lab 03/29/23 1211 03/31/23 1056 04/02/23 1438  WBC 6.0 6.5 7.8  NEUTROABS  --   --  4.5  HGB 7.7* 7.5* 7.8*  HCT 25.4* 25.4* 26.5*  MCV 75.1* 75.6* 75.5*  PLT 181 183 187   Cardiac Enzymes: No results for input(s): CKTOTAL, CKMB, CKMBINDEX, TROPONINI in the last 168 hours. CBG: Recent Labs  Lab 03/31/23 1440 04/03/23 0731  GLUCAP 86 75    Studies/Results: No results found. Medications:   (feeding supplement) PROSource Plus  30 mL Oral TID BM   amiodarone   200 mg Oral Daily   aspirin  EC  81 mg Oral Daily   atorvastatin   80 mg Oral QHS  calcitRIOL   2.25 mcg Oral Q T,Th,Sat-1800   calcium  acetate  1,334 mg Oral TID WC   carvedilol   12.5 mg Oral BID   Chlorhexidine  Gluconate Cloth  6 each Topical Q0600   cinacalcet   60 mg Oral Q T,Th,Sat-1800   darbepoetin (ARANESP ) injection - DIALYSIS  150 mcg Subcutaneous Q Tue-1800   dorzolamide -timolol   1 drop Right Eye BID   leptospermum manuka honey  1 Application Topical Daily   levETIRAcetam   500 mg Oral Once per day on Sunday Tuesday Thursday Saturday   levETIRAcetam   750 mg Oral Once per day on Monday Wednesday Friday   midodrine   5 mg Oral TID WC   multivitamin  1 tablet Oral QHS   mupirocin  ointment  1 Application Nasal BID   nutrition supplement (JUVEN)  1  packet Oral BID BM   pneumococcal 20-valent conjugate vaccine  0.5 mL Intramuscular Tomorrow-1000   sodium zirconium cyclosilicate   10 g Oral BID

## 2023-04-03 NOTE — Plan of Care (Signed)

## 2023-04-03 NOTE — Progress Notes (Signed)
 [facility]                                                                               [name], is a 53 y.o. male, DOB - [birth year], FMW:[id number] Admit date - 03/24/2023    Outpatient Primary MD for the patient is [name] Gibney T, FNP  LOS - 0  days    Brief summary   [name]. is a 53 y.o. male with past medical history significant for ESRD on HD TTS, recent right BKA (Dr. Harden), CAD, CVA, paroxysmal atrial fibrillation not on anticoagulation due to history of ICH, gastroparesis, type 2 diabetes mellitus, diabetic retinopathy, glaucoma, history of left eye enucleation who presented to [name] ED on 1/30 via EMS from dialysis unit with complaints of increased blurriness, weakness, cough and difficulty managing at home.  Patient states symptoms worse with dialysis.  Recently discharged from the hospital to SNF for 3 weeks and has been home over the last 10 days.  Patient reports is unable to take care of himself due to weakness and spouse working all the time.  Requesting placement.  MR angiogram head without contrast with no intracranial large vessel occlusion or significant stenosis.  [name] discussed with ophthalmology on-call, Dr. [name] regarding his blurred vision in the setting of known diabetic retinopathy, glaucoma and history of left eye enucleation; ophthalmology recommended no other workup at this time and vision to be monitored and outpatient follow-up with his retina specialist.  [name] consulted for admission for further evaluation management of progressive weakness with TOC involvement for placement.   Therapy evaluations recommending SNF unfortunately insurance denied SNF authorization he is currently appealing the denial.   Assessment & Plan    Assessment and Plan:   Generalized weakness/deconditioning and gait disturbances With recent prolonged hospitalization followed by subacute rehab after right BKA by Dr. Harden on 02/09/2023.  Therapy  evaluations recommending SNF, insurance denied and patient is appealing the denial.    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 Harden.  Recently seen in outpatient office visit 03/22/2023. -- Cleanse stump wound and left heel wounds with Vahse, paint both areas with betadine , top with foam  -- Apply Medihoney to the stump wound, top with saline moist gauze, and kerlix with ACE wrap.  - plan for  right AKA on Wednesday.       Paroxysmal atrial fibrillation Rate controlled with amiodarone  and Coreg  Not on anticoagulation due to history of ICH.   Type 2 diabetes mellitus Control CBGs CBG (last 3)  Recent Labs    03/31/23 1440 04/03/23 0731  GLUCAP 86 75      Seizure disorder Continue with Keppra  500 mg on Sunday Tuesday Thursday and Saturday and 750 mg daily on Monday Wednesday and Friday No new seizures since admission.   Anemia of chronic disease Hemoglobin around 7.1 , transfuse 1 unit of prbc.  Transfuse to keep hemoglobin greater than 8 prior to ortho surgery.    ESRD on dialysis on TTS Patient currently gets his dialysis at [facility] clinic nephrology  on board and appreciate input.   Hyperkalemia Lokelma  ordered, repeat BMP today shows normal potassium.    Autonomic dysfunction Patient on midodrine  5 mg 3 times daily as needed   Lethargy earlier this am probably from benadryl  injection Vital signs wnl.  Mental status improved later in the day and back to baseline this afternoon.    Pressure injury :  Pressure Injury 03/25/23 Thigh Right Unstageable - Full thickness tissue loss in which the base of the injury is covered by slough (yellow, tan, gray, green or brown) and/or eschar (tan, brown or black) in the wound bed. (Active)  03/25/23 1200  Location: Thigh  Location Orientation: Right  Staging: Unstageable - Full thickness tissue loss in  which the base of the injury is covered by slough (yellow, tan, gray, green or brown) and/or eschar (tan, brown or black) in the wound bed.  Wound Description (Comments):   Present on Admission: Yes     Pressure Injury 03/25/23 Other (Comment) Right Unstageable - Full thickness tissue loss in which the base of the injury is covered by slough (yellow, tan, gray, green or brown) and/or eschar (tan, brown or black) in the wound bed. (Active)  03/25/23 1200  Location: Other (Comment) (stump)  Location Orientation: Right  Staging: Unstageable - Full thickness tissue loss in which the base of the injury is covered by slough (yellow, tan, gray, green or brown) and/or eschar (tan, brown or black) in the wound bed.  Wound Description (Comments):   Present on Admission: Yes     Pressure Injury 03/25/23 Heel Left;Lateral Stage 2 -  Partial thickness loss of dermis presenting as a shallow open injury with a red, pink wound bed without slough. (Active)  03/25/23 1200  Location: Heel  Location Orientation: Left;Lateral  Staging: Stage 2 -  Partial thickness loss of dermis presenting as a shallow open injury with a red, pink wound bed without slough.  Wound Description (Comments):   Present on Admission: Yes     Pressure Injury 03/25/23 Heel Left;Medial Deep Tissue Pressure Injury - Purple or maroon localized area of discolored intact skin or blood-filled blister due to damage of underlying soft tissue from pressure and/or shear. (Active)  03/25/23 1200  Location: Heel  Location Orientation: Left;Medial  Staging: Deep Tissue Pressure Injury - Purple or maroon localized area of discolored intact skin or blood-filled blister due to damage of underlying soft tissue from pressure and/or shear.  Wound Description (Comments):   Present on Admission: Yes       Estimated body mass index is 20.23 kg/m as calculated from the following:   Height as of this encounter: 6' 3 (1.905 m).   Weight as of this  encounter: 73.4 kg.  Code Status: Full code DVT Prophylaxis:  SCDs Start: 03/24/23 2107   Level of Care: Level of care: Med-Surg Family Communication: None at bedside  Disposition Plan:     Remains inpatient appropriate: Pending further evaluation by orthopedics  Procedures:  None  Consultants:   Nephrology.  Orthopedics Dr Harden.  Antimicrobials:   Anti-infectives (From admission, onward)    None        Medications  Scheduled Meds:  (feeding supplement) PROSource Plus  30 mL Oral TID BM   amiodarone   200 mg Oral Daily   aspirin  EC  81 mg Oral Daily   atorvastatin   80 mg Oral QHS   calcitRIOL   2.25 mcg Oral Q Macdonald,Th,Sat-1800   calcium  acetate  1,334 mg Oral TID WC  carvedilol   12.5 mg Oral BID   Chlorhexidine  Gluconate Cloth  6 each Topical Q0600   cinacalcet   60 mg Oral Q Macdonald,Th,Sat-1800   darbepoetin (ARANESP ) injection - DIALYSIS  150 mcg Subcutaneous Q Tue-1800   dorzolamide -timolol   1 drop Right Eye BID   leptospermum manuka honey  1 Application Topical Daily   levETIRAcetam   500 mg Oral Once per day on Sunday Tuesday Thursday Saturday   levETIRAcetam   750 mg Oral Once per day on Monday Wednesday Friday   midodrine   5 mg Oral TID WC   multivitamin  1 tablet Oral QHS   mupirocin  ointment  1 Application Nasal BID   nutrition supplement (JUVEN)  1 packet Oral BID BM   pneumococcal 20-valent conjugate vaccine  0.5 mL Intramuscular Tomorrow-1000   sodium zirconium cyclosilicate   10 g Oral BID   Continuous Infusions: PRN Meds:.acetaminophen  **OR** acetaminophen , hydrALAZINE , loperamide , mouth rinse, oxyCODONE     Subjective:   [name] was seen and examined today. Muscle spasms.   Objective:   Vitals:   04/02/23 1823 04/02/23 2106 04/03/23 0433 04/03/23 0737  BP: (!) 173/79 (!) 142/75 132/65 (!) 156/81  Pulse: 66 72 65 60  Resp: 18 19 18 18   Temp: 98 F (36.7 C) 98.6 F (37 C) 98.2 F (36.8 C) 98 F (36.7 C)  TempSrc: Oral Oral Oral Oral   SpO2: 98% 97% 98% 100%  Weight:      Height:        Intake/Output Summary (Last 24 hours) at 04/03/2023 0807 Last data filed at 04/02/2023 1739 Gross per 24 hour  Intake --  Output 0 ml  Net 0 ml   Filed Weights   03/31/23 1044 03/31/23 1458 04/02/23 1500  Weight: 73.8 kg 72.7 kg 73.4 kg     Exam General exam: Appears calm and comfortable  Respiratory system: Clear to auscultation. Respiratory effort normal. Cardiovascular system: S1 & S2 heard, RRR. No JVD Gastrointestinal system: Abdomen is nondistended, soft and non tender Central nervous system: Alert and oriented. Extremities: gangrenous changes at the transtibial amputation Skin: No rashes,  Psychiatry: Mood & affect appropriate.    Data Reviewed:  I have personally reviewed following labs and imaging studies   CBC Lab Results  Component Value Date   WBC 7.8 04/02/2023   RBC 3.51 (L) 04/02/2023   HGB 7.8 (L) 04/02/2023   HCT 26.5 (L) 04/02/2023   MCV 75.5 (L) 04/02/2023   MCH 22.2 (L) 04/02/2023   PLT 187 04/02/2023   MCHC 29.4 (L) 04/02/2023   RDW 21.0 (H) 04/02/2023   LYMPHSABS 1.5 04/02/2023   MONOABS 1.0 04/02/2023   EOSABS 0.8 (H) 04/02/2023   BASOSABS 0.1 04/02/2023     Last metabolic panel Lab Results  Component Value Date   NA 137 04/03/2023   K 4.4 04/03/2023   CL 99 04/03/2023   CO2 27 04/03/2023   BUN 68 (H) 04/03/2023   CREATININE 6.56 (H) 04/03/2023   GLUCOSE 106 (H) 04/03/2023   GFRNONAA 9 (L) 04/03/2023   GFRAA 5 (L) 08/27/2019   CALCIUM  8.6 (L) 04/03/2023   PHOS 5.2 (H) 03/31/2023   PROT 6.4 (L) 03/24/2023   ALBUMIN  2.7 (L) 03/31/2023   LABGLOB 2.9 08/19/2016   AGRATIO 0.9 08/19/2016   BILITOT 0.7 03/24/2023   ALKPHOS 62 03/24/2023   AST 13 (L) 03/24/2023   ALT 14 03/24/2023   ANIONGAP 11 04/03/2023    CBG (last 3)  Recent Labs    03/31/23 1440 04/03/23 0731  GLUCAP 86 75      Coagulation Profile: No results for input(s): INR, PROTIME in the last 168  hours.   Radiology Studies: No results found.     Elgie Butter M.D. [facility] 04/03/2023, 8:07 AM  Available via Epic secure chat 7am-7pm After 7 pm, please refer to night coverage provider listed on amion.

## 2023-04-03 NOTE — Consult Note (Signed)
 WOC Nurse Consult Note: Reason for Consult: Gangrenous changes to right lower extremity previous amputation site.  Planned right AKA 04/06/23.   Wound type: Left heel stage 3 pressure injury, lateral aspect Pressure Injury POA: Yes Measurement: see nursing flowsheet Wound bed: pink and dry with 0.2 cm darkened area. Unable to stage based on the presence of devitalized tissue.  Drainage (amount, consistency, odor) minimal serosanguinous   Periwound: peeling epithelium Dressing procedure/placement/frequency: Will stop betadine .  CLeanSE left heel with VASHE (LAWSON # [id number]) .  Apply VASHE moist gauze to wound bed.  COver with dry gauze and kerlix/tape.  CHange daily.  Will not follow at this time.  Please re-consult if needed.  Darice Cooley MSN, RN, FNP-BC CWON Wound, Ostomy, Continence Nurse Outpatient [facility] [phone] Pager [phone]

## 2023-04-03 NOTE — Progress Notes (Signed)
 1/2 amp of dextrose  given per Dr., Normand Beckwith Marquon Alcala

## 2023-04-04 ENCOUNTER — Inpatient Hospital Stay (HOSPITAL_COMMUNITY): Payer: Medicare HMO

## 2023-04-04 DIAGNOSIS — G40909 Epilepsy, unspecified, not intractable, without status epilepticus: Secondary | ICD-10-CM | POA: Diagnosis present

## 2023-04-04 DIAGNOSIS — R5381 Other malaise: Secondary | ICD-10-CM | POA: Diagnosis not present

## 2023-04-04 DIAGNOSIS — M6281 Muscle weakness (generalized): Secondary | ICD-10-CM | POA: Diagnosis not present

## 2023-04-04 DIAGNOSIS — E1152 Type 2 diabetes mellitus with diabetic peripheral angiopathy with gangrene: Secondary | ICD-10-CM | POA: Diagnosis not present

## 2023-04-04 DIAGNOSIS — E1165 Type 2 diabetes mellitus with hyperglycemia: Secondary | ICD-10-CM | POA: Diagnosis not present

## 2023-04-04 DIAGNOSIS — D638 Anemia in other chronic diseases classified elsewhere: Secondary | ICD-10-CM | POA: Diagnosis not present

## 2023-04-04 DIAGNOSIS — I132 Hypertensive heart and chronic kidney disease with heart failure and with stage 5 chronic kidney disease, or end stage renal disease: Secondary | ICD-10-CM | POA: Diagnosis present

## 2023-04-04 DIAGNOSIS — E440 Moderate protein-calorie malnutrition: Secondary | ICD-10-CM | POA: Diagnosis present

## 2023-04-04 DIAGNOSIS — N186 End stage renal disease: Secondary | ICD-10-CM | POA: Diagnosis present

## 2023-04-04 DIAGNOSIS — Z992 Dependence on renal dialysis: Secondary | ICD-10-CM | POA: Diagnosis not present

## 2023-04-04 DIAGNOSIS — R531 Weakness: Secondary | ICD-10-CM | POA: Diagnosis present

## 2023-04-04 DIAGNOSIS — L89623 Pressure ulcer of left heel, stage 3: Secondary | ICD-10-CM | POA: Diagnosis present

## 2023-04-04 DIAGNOSIS — I2510 Atherosclerotic heart disease of native coronary artery without angina pectoris: Secondary | ICD-10-CM | POA: Diagnosis not present

## 2023-04-04 DIAGNOSIS — H538 Other visual disturbances: Secondary | ICD-10-CM | POA: Diagnosis not present

## 2023-04-04 DIAGNOSIS — D631 Anemia in chronic kidney disease: Secondary | ICD-10-CM | POA: Diagnosis present

## 2023-04-04 DIAGNOSIS — I70261 Atherosclerosis of native arteries of extremities with gangrene, right leg: Secondary | ICD-10-CM | POA: Diagnosis not present

## 2023-04-04 DIAGNOSIS — Z681 Body mass index (BMI) 19 or less, adult: Secondary | ICD-10-CM | POA: Diagnosis not present

## 2023-04-04 DIAGNOSIS — I10 Essential (primary) hypertension: Secondary | ICD-10-CM | POA: Diagnosis not present

## 2023-04-04 DIAGNOSIS — I5032 Chronic diastolic (congestive) heart failure: Secondary | ICD-10-CM | POA: Diagnosis present

## 2023-04-04 DIAGNOSIS — L89890 Pressure ulcer of other site, unstageable: Secondary | ICD-10-CM | POA: Diagnosis present

## 2023-04-04 DIAGNOSIS — E1151 Type 2 diabetes mellitus with diabetic peripheral angiopathy without gangrene: Secondary | ICD-10-CM | POA: Diagnosis not present

## 2023-04-04 DIAGNOSIS — T8743 Infection of amputation stump, right lower extremity: Secondary | ICD-10-CM | POA: Diagnosis present

## 2023-04-04 DIAGNOSIS — I480 Paroxysmal atrial fibrillation: Secondary | ICD-10-CM | POA: Diagnosis present

## 2023-04-04 DIAGNOSIS — Y835 Amputation of limb(s) as the cause of abnormal reaction of the patient, or of later complication, without mention of misadventure at the time of the procedure: Secondary | ICD-10-CM | POA: Diagnosis not present

## 2023-04-04 DIAGNOSIS — L8990 Pressure ulcer of unspecified site, unspecified stage: Secondary | ICD-10-CM | POA: Insufficient documentation

## 2023-04-04 DIAGNOSIS — I739 Peripheral vascular disease, unspecified: Secondary | ICD-10-CM | POA: Diagnosis not present

## 2023-04-04 DIAGNOSIS — R2689 Other abnormalities of gait and mobility: Secondary | ICD-10-CM | POA: Diagnosis not present

## 2023-04-04 DIAGNOSIS — T8781 Dehiscence of amputation stump: Secondary | ICD-10-CM | POA: Diagnosis not present

## 2023-04-04 DIAGNOSIS — E1122 Type 2 diabetes mellitus with diabetic chronic kidney disease: Secondary | ICD-10-CM | POA: Diagnosis present

## 2023-04-04 DIAGNOSIS — N2581 Secondary hyperparathyroidism of renal origin: Secondary | ICD-10-CM | POA: Diagnosis present

## 2023-04-04 DIAGNOSIS — Z1152 Encounter for screening for COVID-19: Secondary | ICD-10-CM | POA: Diagnosis not present

## 2023-04-04 LAB — CBC
HCT: 27.6 % — ABNORMAL LOW (ref 39.0–52.0)
Hemoglobin: 8.3 g/dL — ABNORMAL LOW (ref 13.0–17.0)
MCH: 22.7 pg — ABNORMAL LOW (ref 26.0–34.0)
MCHC: 30.1 g/dL (ref 30.0–36.0)
MCV: 75.6 fL — ABNORMAL LOW (ref 80.0–100.0)
Platelets: 187 10*3/uL (ref 150–400)
RBC: 3.65 MIL/uL — ABNORMAL LOW (ref 4.22–5.81)
RDW: 20.3 % — ABNORMAL HIGH (ref 11.5–15.5)
WBC: 7.8 10*3/uL (ref 4.0–10.5)
nRBC: 0 % (ref 0.0–0.2)

## 2023-04-04 LAB — RENAL FUNCTION PANEL
Albumin: 2.5 g/dL — ABNORMAL LOW (ref 3.5–5.0)
Anion gap: 18 — ABNORMAL HIGH (ref 5–15)
BUN: 115 mg/dL — ABNORMAL HIGH (ref 6–20)
CO2: 25 mmol/L (ref 22–32)
Calcium: 9.1 mg/dL (ref 8.9–10.3)
Chloride: 96 mmol/L — ABNORMAL LOW (ref 98–111)
Creatinine, Ser: 9.32 mg/dL — ABNORMAL HIGH (ref 0.61–1.24)
GFR, Estimated: 6 mL/min — ABNORMAL LOW (ref >60)
Glucose, Bld: 96 mg/dL (ref 70–99)
Phosphorus: 6.6 mg/dL — ABNORMAL HIGH (ref 2.5–4.6)
Potassium: 5.2 mmol/L — ABNORMAL HIGH (ref 3.5–5.1)
Sodium: 139 mmol/L (ref 135–145)

## 2023-04-04 LAB — TYPE AND SCREEN
ABO/RH(D): A POS
Antibody Screen: NEGATIVE
Unit division: 0

## 2023-04-04 LAB — BPAM RBC
Blood Product Expiration Date: 202503082359
ISSUE DATE / TIME: 202502091525
Unit Type and Rh: 6200

## 2023-04-04 LAB — POTASSIUM: Potassium: 4.9 mmol/L (ref 3.5–5.1)

## 2023-04-04 MED ORDER — SODIUM ZIRCONIUM CYCLOSILICATE 10 G PO PACK
10.0000 g | PACK | Freq: Two times a day (BID) | ORAL | Status: AC
  Administered 2023-04-04: 10 g via ORAL
  Filled 2023-04-04: qty 1

## 2023-04-04 MED ORDER — LEVETIRACETAM 500 MG PO TABS
500.0000 mg | ORAL_TABLET | ORAL | Status: DC
  Administered 2023-04-04 – 2023-04-13 (×6): 500 mg via ORAL
  Filled 2023-04-04 (×6): qty 1

## 2023-04-04 MED ORDER — LEVETIRACETAM 500 MG PO TABS
750.0000 mg | ORAL_TABLET | ORAL | Status: DC
  Administered 2023-04-05 – 2023-04-12 (×4): 750 mg via ORAL
  Filled 2023-04-04 (×4): qty 1

## 2023-04-04 NOTE — Progress Notes (Signed)
 [facility]                                                                               [name], is a 53 y.o. male, DOB - 07-22-1970, ZOX:[id number] Admit date - 03/24/2023    Outpatient Primary MD for the patient is Eliott Guess T, FNP  LOS - 0  days    Brief summary   [name] Ardon Duggal. is a 53 y.o. male with past medical history significant for ESRD on HD TTS, recent right BKA (Dr. Julio Ohm), CAD, CVA, paroxysmal atrial fibrillation not on anticoagulation due to history of ICH, gastroparesis, type 2 diabetes mellitus, diabetic retinopathy, glaucoma, history of left eye enucleation who presented to [facility] ED on 1/30 via EMS from dialysis unit with complaints of increased blurriness, weakness, cough and difficulty managing at home.  Patient states symptoms worse with dialysis.  Recently discharged from the hospital to SNF for 3 weeks and has been home over the last 10 days.  Patient reports is unable to take care of himself due to weakness and spouse working all the time.  Requesting placement.  MR angiogram head without contrast with no intracranial large vessel occlusion or significant stenosis.  [name] discussed with ophthalmology on-call, Dr. Lydia Sams regarding his blurred vision in the setting of known diabetic retinopathy, glaucoma and history of left eye enucleation; ophthalmology recommended no other workup at this time and vision to be monitored and outpatient follow-up with his retina specialist.  [name] consulted for admission for further evaluation management of progressive weakness with TOC involvement for placement.   Therapy evaluations recommending SNF unfortunately insurance denied SNF authorization he is currently appealing the denial. He was transferred to [facility] for evaluation of the right BKA gangrene. Plan for AKA on Wednesday.    Assessment & Plan    Assessment and Plan:   Generalized weakness/deconditioning and gait disturbances With recent  prolonged hospitalization followed by subacute rehab after right BKA by Dr. Julio Ohm on 02/09/2023.  Therapy evaluations recommending SNF, insurance denied and patient is appealing the denial.   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 Julio Ohm.  Recently seen in outpatient office visit 03/22/2023. -- Cleanse stump wound and left heel wounds with Vahse, paint both areas with betadine , top with foam  -- Apply Medihoney to the stump wound, top with saline moist gauze, and kerlix with ACE wrap.  - plan for  right AKA on Wednesday.    Left heel stage 3 pressure injury:  Dry gangrene Pain control  Wound care consulted and recommendations given.     Paroxysmal atrial fibrillation Rate controlled with amiodarone  and Coreg  Not on anticoagulation due to history of ICH.   Type 2 diabetes mellitus Control CBGs CBG (last 3)  Recent Labs    04/03/23 0731  GLUCAP 75      Seizure disorder Continue with Keppra  500 mg on Sunday Tuesday Thursday and Saturday and 750 mg daily on Monday Wednesday and Friday No new seizures since admission.   Anemia of chronic disease Hemoglobin around 7.1 , transfuse 1 unit of  prbc.  Hemoglobin stable around 8.3.  Transfuse to keep hemoglobin greater than 8 prior to ortho surgery.    ESRD on dialysis on TTS Patient currently gets his dialysis at [facility] clinic nephrology on board and appreciate input.   Hyperkalemia Lokelma  ordered, repeat BMP today shows normal potassium.    Autonomic dysfunction Patient on midodrine  5 mg 3 times daily as needed   Lethargy  probably from benadryl  injection Vital signs wnl.  Mental status improved later in the day and back to baseline this afternoon.    Pressure injury :  Pressure Injury 03/25/23 Thigh Right Unstageable - Full thickness tissue loss in which the base of the injury is covered by slough (yellow,  tan, gray, green or brown) and/or eschar (tan, brown or black) in the wound bed. (Active)  03/25/23 1200  Location: Thigh  Location Orientation: Right  Staging: Unstageable - Full thickness tissue loss in which the base of the injury is covered by slough (yellow, tan, gray, green or brown) and/or eschar (tan, brown or black) in the wound bed.  Wound Description (Comments):   Present on Admission: Yes     Pressure Injury 03/25/23 Other (Comment) Right Unstageable - Full thickness tissue loss in which the base of the injury is covered by slough (yellow, tan, gray, green or brown) and/or eschar (tan, brown or black) in the wound bed. (Active)  03/25/23 1200  Location: Other (Comment) (stump)  Location Orientation: Right  Staging: Unstageable - Full thickness tissue loss in which the base of the injury is covered by slough (yellow, tan, gray, green or brown) and/or eschar (tan, brown or black) in the wound bed.  Wound Description (Comments):   Present on Admission: Yes     Pressure Injury 03/25/23 Heel Left;Lateral Stage 2 -  Partial thickness loss of dermis presenting as a shallow open injury with a red, pink wound bed without slough. (Active)  03/25/23 1200  Location: Heel  Location Orientation: Left;Lateral  Staging: Stage 2 -  Partial thickness loss of dermis presenting as a shallow open injury with a red, pink wound bed without slough.  Wound Description (Comments):   Present on Admission: Yes     Pressure Injury 03/25/23 Heel Left;Medial Deep Tissue Pressure Injury - Purple or maroon localized area of discolored intact skin or blood-filled blister due to damage of underlying soft tissue from pressure and/or shear. (Active)  03/25/23 1200  Location: Heel  Location Orientation: Left;Medial  Staging: Deep Tissue Pressure Injury - Purple or maroon localized area of discolored intact skin or blood-filled blister due to damage of underlying soft tissue from pressure and/or shear.  Wound  Description (Comments):   Present on Admission: Yes   Wound care consulted and recommendations given.      Estimated body mass index is 20.23 kg/m as calculated from the following:   Height as of this encounter: 6\' 3"  (1.905 m).   Weight as of this encounter: 73.4 kg.  Code Status: Full code DVT Prophylaxis:  SCDs Start: 03/24/23 2107   Level of Care: Level of care: Med-Surg Family Communication: None at bedside  Disposition Plan:     Remains inpatient appropriate: AKA scheduled on Wednesday.  Procedures:  None  Consultants:   Nephrology.  Orthopedics Dr Julio Ohm.  Antimicrobials:   Anti-infectives (From admission, onward)    None        Medications  Scheduled Meds:  (feeding supplement) PROSource Plus  30 mL Oral TID BM   amiodarone   200 mg  Oral Daily   aspirin  EC  81 mg Oral Daily   atorvastatin   80 mg Oral QHS   calcitRIOL   2.25 mcg Oral Q T,Th,Sat-1800   calcium  acetate  1,334 mg Oral TID WC   carvedilol   12.5 mg Oral BID   cinacalcet   60 mg Oral Q T,Th,Sat-1800   darbepoetin (ARANESP ) injection - DIALYSIS  150 mcg Subcutaneous Q Tue-1800   dorzolamide -timolol   1 drop Right Eye BID   leptospermum manuka honey  1 Application Topical Daily   levETIRAcetam   500 mg Oral Once per day on Sunday Monday Wednesday Friday   [START ON 04/05/2023] levETIRAcetam   750 mg Oral Once per day on Tuesday Thursday Saturday   lidocaine -prilocaine   1 Application Topical Once per day on Monday Wednesday Friday   multivitamin  1 tablet Oral QHS   mupirocin  ointment  1 Application Nasal BID   nutrition supplement (JUVEN)  1 packet Oral BID BM   pneumococcal 20-valent conjugate vaccine  0.5 mL Intramuscular Tomorrow-1000   sodium zirconium cyclosilicate   10 g Oral BID   Continuous Infusions: PRN Meds:.acetaminophen  **OR** acetaminophen , hydrALAZINE , loperamide , methocarbamol  (ROBAXIN ) injection, midodrine , mouth rinse, oxyCODONE     Subjective:   [name] was seen and  examined today. Wants to talk to Dr Julio Ohm.  Objective:   Vitals:   04/03/23 1546 04/03/23 1733 04/03/23 1951 04/04/23 0622  BP: 135/68 (!) 149/72 123/72 118/63  Pulse: (!) 58 64 66 69  Resp: 16 18 20 20   Temp: 97.9 F (36.6 C) 98.2 F (36.8 C) 98.1 F (36.7 C) 98.1 F (36.7 C)  TempSrc: Oral Oral Oral Oral  SpO2: 100% 100% 96% 99%  Weight:      Height:        Intake/Output Summary (Last 24 hours) at 04/04/2023 1548 Last data filed at 04/04/2023 0600 Gross per 24 hour  Intake 708 ml  Output 0 ml  Net 708 ml   Filed Weights   03/31/23 1044 03/31/23 1458 04/02/23 1500  Weight: 73.8 kg 72.7 kg 73.4 kg     Exam General exam: Appears calm and comfortable  Respiratory system: Clear to auscultation. Respiratory effort normal. Cardiovascular system: S1 & S2 heard, RRR. Gastrointestinal system: Abdomen is nondistended, soft and nontender.  Central nervous system: Alert and oriented.  Skin: stage 3 pressure injury on the left heel. Right BKA Stump gangrene. Psychiatry: Mood & affect appropriate.    Data Reviewed:  I have personally reviewed following labs and imaging studies   CBC Lab Results  Component Value Date   WBC 7.8 04/04/2023   RBC 3.65 (L) 04/04/2023   HGB 8.3 (L) 04/04/2023   HCT 27.6 (L) 04/04/2023   MCV 75.6 (L) 04/04/2023   MCH 22.7 (L) 04/04/2023   PLT 187 04/04/2023   MCHC 30.1 04/04/2023   RDW 20.3 (H) 04/04/2023   LYMPHSABS 1.8 04/03/2023   MONOABS 0.8 04/03/2023   EOSABS 0.6 (H) 04/03/2023   BASOSABS 0.1 04/03/2023     Last metabolic panel Lab Results  Component Value Date   NA 139 04/04/2023   K 4.9 04/04/2023   CL 96 (L) 04/04/2023   CO2 25 04/04/2023   BUN 115 (H) 04/04/2023   CREATININE 9.32 (H) 04/04/2023   GLUCOSE 96 04/04/2023   GFRNONAA 6 (L) 04/04/2023   GFRAA 5 (L) 08/27/2019   CALCIUM  9.1 04/04/2023   PHOS 6.6 (H) 04/04/2023   PROT 6.4 (L) 03/24/2023   ALBUMIN  2.5 (L) 04/04/2023   LABGLOB 2.9 08/19/2016   AGRATIO  0.9  08/19/2016   BILITOT 0.7 03/24/2023   ALKPHOS 62 03/24/2023   AST 13 (L) 03/24/2023   ALT 14 03/24/2023   ANIONGAP 18 (H) 04/04/2023    CBG (last 3)  Recent Labs    04/03/23 0731  GLUCAP 75      Coagulation Profile: No results for input(s): "INR", "PROTIME" in the last 168 hours.   Radiology Studies: No results found.     Feliciana Horn M.D. [facility] 04/04/2023, 3:48 PM  Available via Epic secure chat 7am-7pm After 7 pm, please refer to night coverage provider listed on amion.

## 2023-04-04 NOTE — Plan of Care (Signed)
   Problem: Health Behavior/Discharge Planning: Goal: Ability to manage health-related needs will improve Outcome: Progressing

## 2023-04-04 NOTE — Progress Notes (Signed)
 PT Cancellation Note  Patient Details Name: [name]. MRN: [id number] DOB: 31-Jul-1970   Cancelled Treatment:    Reason Eval/Treat Not Completed: Other (comment) Pt unavailable; moving bowels at first PT attempt;   Will follow up later today as time allows;  Otherwise, will follow up for PT tomorrow;   Thank you,  Darcus Eastern, PT  Acute Rehabilitation Services Office [phone]    Marcial Setting 04/04/2023, 12:18 PM

## 2023-04-04 NOTE — Plan of Care (Signed)
   Problem: Health Behavior/Discharge Planning: Goal: Ability to manage health-related needs will improve Outcome: Completed/Met

## 2023-04-04 NOTE — Progress Notes (Addendum)
 [name] Progress Note   Subjective:    Seen and examined patient at bedside. Appears oriented. Denies SOB, CP, and N/V. He expresses frustration with his food tray. Say he's not getting enough food causing him to outside foods. K+ now is 5.2. Next HD 2/11.  Objective Vitals:   04/03/23 1546 04/03/23 1733 04/03/23 1951 04/04/23 0622  BP: 135/68 (!) 149/72 123/72 118/63  Pulse: (!) 58 64 66 69  Resp: 16 18 20 20   Temp: 97.9 F (36.6 C) 98.2 F (36.8 C) 98.1 F (36.7 C) 98.1 F (36.7 C)  TempSrc: Oral Oral Oral Oral  SpO2: 100% 100% 96% 99%  Weight:      Height:       Physical Exam General: Alert chronically ill male NAD Heart: RRR no MRG Lungs: CTA bilaterally nonlabored breathing Abdomen: Soft and non-tender Extremities: No pedal edema, R BKA dressing dry clear, no pedal edema Dialysis Access:  LU A  AVG + bruit  Filed Weights   03/31/23 1044 03/31/23 1458 04/02/23 1500  Weight: 73.8 kg 72.7 kg 73.4 kg    Intake/Output Summary (Last 24 hours) at 04/04/2023 1022 Last data filed at 04/04/2023 0600 Gross per 24 hour  Intake 938 ml  Output 0 ml  Net 938 ml    Additional Objective Labs: Basic Metabolic Panel: Recent Labs  Lab 03/29/23 1211 03/31/23 1055 04/01/23 0924 04/02/23 1438 04/03/23 0151 04/04/23 0356  NA 135 139   < > 138 137 139  K 6.0* 6.8*   < > 5.9* 4.4 5.2*  CL 99 103   < > 98 99 96*  CO2 22 24   < > 26 27 25   GLUCOSE 75 89   < > 86 106* 96  BUN 89* 62*   < > 94* 68* 115*  CREATININE 10.54* 9.02*   < > 8.69* 6.56* 9.32*  CALCIUM  9.0 8.9   < > 8.8* 8.6* 9.1  PHOS 7.1* 5.2*  --   --   --  6.6*   < > = values in this interval not displayed.   Liver Function Tests: Recent Labs  Lab 03/29/23 1211 03/31/23 1055 04/04/23 0356  ALBUMIN  2.7* 2.7* 2.5*   No results for input(s): "LIPASE", "AMYLASE" in the last 168 hours. CBC: Recent Labs  Lab 03/29/23 1211 03/31/23 1056 04/02/23 1438 04/03/23 0955 04/04/23 0356  WBC 6.0  6.5 7.8 6.6 7.8  NEUTROABS  --   --  4.5 3.4  --   HGB 7.7* 7.5* 7.8* 7.1* 8.3*  HCT 25.4* 25.4* 26.5* 24.4* 27.6*  MCV 75.1* 75.6* 75.5* 74.8* 75.6*  PLT 181 183 187 179 187   Blood Culture    Component Value Date/Time   SDES TISSUE 02/04/2023 1206   SDES WOUND 02/04/2023 1206   SPECREQUEST RIGHT FOOT 02/04/2023 1206   SPECREQUEST RIGHT FOOT 02/04/2023 1206   CULT  02/04/2023 1206    ABUNDANT STAPHYLOCOCCUS AUREUS MODERATE CORYNEBACTERIUM STRIATUM Standardized susceptibility testing for this organism is not available. NO ANAEROBES ISOLATED Performed at [facility], [address], [place], [place] [address]    CULT  02/04/2023 1206    ABUNDANT STAPHYLOCOCCUS AUREUS SUSCEPTIBILITIES PERFORMED ON PREVIOUS CULTURE WITHIN THE LAST 5 DAYS. MODERATE CORYNEBACTERIUM STRIATUM Standardized susceptibility testing for this organism is not available. NO ANAEROBES ISOLATED Performed at [facility], [address] [place], [place] [address]    REPTSTATUS 02/09/2023 FINAL 02/04/2023 1206   REPTSTATUS 02/09/2023 FINAL 02/04/2023 1206  Cardiac Enzymes: No results for input(s): "CKTOTAL", "CKMB", "CKMBINDEX", "TROPONINI" in the last 168 hours. CBG: Recent Labs  Lab 03/31/23 1440 04/03/23 0731  GLUCAP 86 75   Iron Studies: No results for input(s): "IRON", "TIBC", "TRANSFERRIN", "FERRITIN" in the last 72 hours. Lab Results  Component Value Date   INR 1.1 03/01/2022   INR 1.6 (H) 11/21/2021   INR 1.3 (H) 08/12/2021   Studies/Results: No results found.  Medications:   (feeding supplement) PROSource Plus  30 mL Oral TID BM   amiodarone   200 mg Oral Daily   aspirin  EC  81 mg Oral Daily   atorvastatin   80 mg Oral QHS   calcitRIOL   2.25 mcg Oral Q T,Th,Sat-1800   calcium  acetate  1,334 mg Oral TID WC   carvedilol   12.5 mg Oral BID   cinacalcet   60 mg Oral Q T,Th,Sat-1800   darbepoetin (ARANESP ) injection - DIALYSIS  150 mcg Subcutaneous Q Tue-1800    dorzolamide -timolol   1 drop Right Eye BID   leptospermum manuka honey  1 Application Topical Daily   levETIRAcetam   500 mg Oral Once per day on Sunday Monday Wednesday Friday   [START ON 04/05/2023] levETIRAcetam   750 mg Oral Once per day on Tuesday Thursday Saturday   lidocaine -prilocaine   1 Application Topical Once per day on Monday Wednesday Friday   multivitamin  1 tablet Oral QHS   mupirocin  ointment  1 Application Nasal BID   nutrition supplement (JUVEN)  1 packet Oral BID BM   pneumococcal 20-valent conjugate vaccine  0.5 mL Intramuscular Tomorrow-1000   sodium zirconium cyclosilicate   10 g Oral BID    Dialysis Orders: [facility] TTS 3h  72kg  B400  LUA AVF  Heparin  1000+ 400 u/hr  Assessment/Plan: Right BKA with painful gangrenous wound: per Dr. Julio Ohm plans for right AKA on 2/12. Antibiotics per admit team/Dr. Julio Ohm ESRD -HD TTS. Next HD 2/11. General Weakness/debility: secondary #1 patient family unable to care for patient SNF recommended Hyperkalemia: K+ 5.2. He's upset with the renal diet and currently on carb mod /renal diet. He admits to eating outside foods. I advised following the proper diet and limiting outside foods to keep his K+ under control. Follow trend closely. May need to consider Lokelma  but will hold off for now. HTN/volume: no excess volume on exam . BP okay. On midodrine  5 mg 3 times daily.  Anemia: Hgb 7.1. Not reported outpatient ESA. Given Aranesp  150 mcg 02/04  q Tues. subcu while here Secondary hyperparathyroidism: corrected calcium  okay, phosphorus 5.2, on calcitriol , Sensipar , and calcium  acetate as binder Atrial fibrillation: on amiodarone  and Coreg  DM T2= per admit  [name], NP [facility] 04/04/2023,10:22 AM  LOS: 0 days

## 2023-04-04 NOTE — Progress Notes (Signed)
 Pt receives out-pt HD at [facility] on TTS 5:15 am chair time. Info provided to CSW in the event snf needed at d/c. Will assist as needed.   Lauraine Polite Renal Navigator [phone]

## 2023-04-05 DIAGNOSIS — N186 End stage renal disease: Secondary | ICD-10-CM | POA: Diagnosis not present

## 2023-04-05 DIAGNOSIS — I480 Paroxysmal atrial fibrillation: Secondary | ICD-10-CM

## 2023-04-05 DIAGNOSIS — D638 Anemia in other chronic diseases classified elsewhere: Secondary | ICD-10-CM | POA: Diagnosis not present

## 2023-04-05 DIAGNOSIS — I10 Essential (primary) hypertension: Secondary | ICD-10-CM | POA: Diagnosis not present

## 2023-04-05 DIAGNOSIS — R531 Weakness: Secondary | ICD-10-CM | POA: Diagnosis not present

## 2023-04-05 LAB — RENAL FUNCTION PANEL
Albumin: 2.5 g/dL — ABNORMAL LOW (ref 3.5–5.0)
Anion gap: 15 (ref 5–15)
BUN: 139 mg/dL — ABNORMAL HIGH (ref 6–20)
CO2: 24 mmol/L (ref 22–32)
Calcium: 9 mg/dL (ref 8.9–10.3)
Chloride: 103 mmol/L (ref 98–111)
Creatinine, Ser: 10.47 mg/dL — ABNORMAL HIGH (ref 0.61–1.24)
GFR, Estimated: 5 mL/min — ABNORMAL LOW (ref >60)
Glucose, Bld: 75 mg/dL (ref 70–99)
Phosphorus: 6.9 mg/dL — ABNORMAL HIGH (ref 2.5–4.6)
Potassium: 5.2 mmol/L — ABNORMAL HIGH (ref 3.5–5.1)
Sodium: 142 mmol/L (ref 135–145)

## 2023-04-05 LAB — CBC WITH DIFFERENTIAL/PLATELET
Abs Immature Granulocytes: 0.03 10*3/uL (ref 0.00–0.07)
Basophils Absolute: 0.1 10*3/uL (ref 0.0–0.1)
Basophils Relative: 1 %
Eosinophils Absolute: 0.7 10*3/uL — ABNORMAL HIGH (ref 0.0–0.5)
Eosinophils Relative: 10 %
HCT: 26.5 % — ABNORMAL LOW (ref 39.0–52.0)
Hemoglobin: 8 g/dL — ABNORMAL LOW (ref 13.0–17.0)
Immature Granulocytes: 0 %
Lymphocytes Relative: 21 %
Lymphs Abs: 1.4 10*3/uL (ref 0.7–4.0)
MCH: 22.8 pg — ABNORMAL LOW (ref 26.0–34.0)
MCHC: 30.2 g/dL (ref 30.0–36.0)
MCV: 75.5 fL — ABNORMAL LOW (ref 80.0–100.0)
Monocytes Absolute: 0.8 10*3/uL (ref 0.1–1.0)
Monocytes Relative: 12 %
Neutro Abs: 3.9 10*3/uL (ref 1.7–7.7)
Neutrophils Relative %: 56 %
Platelets: 180 10*3/uL (ref 150–400)
RBC: 3.51 MIL/uL — ABNORMAL LOW (ref 4.22–5.81)
RDW: 20.5 % — ABNORMAL HIGH (ref 11.5–15.5)
WBC: 6.9 10*3/uL (ref 4.0–10.5)
nRBC: 0 % (ref 0.0–0.2)

## 2023-04-05 MED ORDER — HEPARIN SODIUM (PORCINE) 1000 UNIT/ML IJ SOLN
1500.0000 [IU] | Freq: Once | INTRAMUSCULAR | Status: AC
  Administered 2023-04-05: 1500 [IU] via INTRAVENOUS
  Filled 2023-04-05: qty 2
  Filled 2023-04-05: qty 1.5

## 2023-04-05 MED ORDER — TRANEXAMIC ACID 1000 MG/10ML IV SOLN
2000.0000 mg | INTRAVENOUS | Status: DC
  Filled 2023-04-05: qty 20

## 2023-04-05 MED ORDER — SODIUM ZIRCONIUM CYCLOSILICATE 10 G PO PACK
10.0000 g | PACK | Freq: Two times a day (BID) | ORAL | Status: AC
  Administered 2023-04-05 – 2023-04-06 (×2): 10 g via ORAL
  Filled 2023-04-05 (×2): qty 1

## 2023-04-05 MED ORDER — TRANEXAMIC ACID-NACL 1000-0.7 MG/100ML-% IV SOLN
1000.0000 mg | INTRAVENOUS | Status: AC
  Administered 2023-04-06: 1000 mg via INTRAVENOUS
  Filled 2023-04-05 (×2): qty 100

## 2023-04-05 MED ORDER — CHLORHEXIDINE GLUCONATE 4 % EX SOLN
60.0000 mL | Freq: Once | CUTANEOUS | Status: AC
Start: 2023-04-05 — End: 2023-04-06
  Administered 2023-04-06: 4 via TOPICAL
  Filled 2023-04-05: qty 60

## 2023-04-05 MED ORDER — POVIDONE-IODINE 10 % EX SWAB: 2.0000 | Freq: Once | CUTANEOUS | Status: DC

## 2023-04-05 MED ORDER — CEFAZOLIN SODIUM-DEXTROSE 2-4 GM/100ML-% IV SOLN
2.0000 g | INTRAVENOUS | Status: AC
  Administered 2023-04-06: 2 g via INTRAVENOUS
  Filled 2023-04-05: qty 100

## 2023-04-05 MED ORDER — HEPARIN SODIUM (PORCINE) 1000 UNIT/ML IJ SOLN
1500.0000 [IU] | Freq: Once | INTRAMUSCULAR | Status: AC
  Filled 2023-04-05: qty 2
  Filled 2023-04-05: qty 1.5

## 2023-04-05 NOTE — Plan of Care (Signed)
Problem: Clinical Measurements: Goal: Will remain free from infection Outcome: Completed/Met

## 2023-04-05 NOTE — TOC Progression Note (Signed)
Transition of Care (TOC) - Progression Note    Patient Details  Name: [name]. MRN: [id number] Date of Birth: [birth year]  Transition of Care [facility]) CM/SW Contact  Carley Hammed, LCSW Phone Number: 04/05/2023, 1:52 PM  Clinical Narrative:    Per chart review, pt is to undergo AKA tomorrow. Pt has been worked up for SNF before transfer, though insurance issued a denial. TOC will follow and address disposition after procedure.      Barriers to Discharge: Insurance Authorization  Expected Discharge Plan and Services                                               Social Determinants of Health (SDOH) Interventions SDOH Screenings   Food Insecurity: Patient Declined (03/31/2023)  Housing: Patient Declined (03/31/2023)  Transportation Needs: Patient Declined (03/31/2023)  Utilities: Patient Declined (03/31/2023)  Financial Resource Strain: High Risk (07/05/2017)  Physical Activity: Inactive (07/05/2017)  Social Connections: Unknown (03/31/2023)  Stress: Stress Concern Present (07/05/2017)  Tobacco Use: Low Risk  (03/31/2023)  Health Literacy: Low Risk  (06/01/2020)   Received from [facility]    Readmission Risk Interventions    07/27/2021   11:49 AM 11/27/2020    3:06 PM  Readmission Risk Prevention Plan  Transportation Screening Complete Complete  PCP or Specialist Appt within 3-5 Days  Complete  HRI or Home Care Consult  Complete  Social Work Consult for Recovery Care Planning/Counseling  Complete  Palliative Care Screening  Not Applicable  Medication Review Oceanographer) Complete Complete  SW Recovery Care/Counseling Consult Complete   Palliative Care Screening Not Applicable   Skilled Nursing Facility Not Applicable

## 2023-04-05 NOTE — Progress Notes (Signed)
Physical Therapy Treatment Patient Details Name: [name]. MRN: [id number] DOB: May 16, 1970 Today's Date: 04/05/2023   History of Present Illness The pt is a 53 yo male presenting 1/30 from HD with blurriness and weakness since BKA on 02/09/23. Admitted to AP or FTT, inability to manage at home, transferred to [facility] 2/6 for surgical debridement. PMH includes:  ESRD on HD TTS, recent right BKA ([name]), CAD, CVA, paroxysmal atrial fibrillation not on anticoagulation due to history of ICH, gastroparesis, type 2 diabetes mellitus, diabetic retinopathy, glaucoma, and history of left eye enucleation.    PT Comments  Continuing work on functional mobility and activity tolerance;  worked on functional transfers during session today; Pt is motivated to get up, and asked to be up for lunch; We worked from [employer] on reciprocal scooting, and sit to stand transfers; Pt still with weakness, difficulty rising to full stand with 2 person assist and bil UEs pushing on RW; Opted to use Maximove for safe transfer OOB to recliner; Pt reported feeling like his BP was decreasing in the recliner; Nursing notified; Ended session with pt reclined, LEs elevated in recliner, pt eating lunch    If plan is discharge home, recommend the following: A lot of help with bathing/dressing/bathroom;A lot of help with walking and/or transfers;Assistance with cooking/housework;Help with stairs or ramp for entrance   Can travel by private vehicle     No  Equipment Recommendations  Wheelchair (measurements PT);Wheelchair cushion (measurements PT);Hoyer lift    Recommendations for Other Services       Precautions / Restrictions Precautions Precautions: Fall Precaution/Restrictions Comments: R BKA Restrictions Weight Bearing Restrictions Per Provider Order: No     Mobility  Bed Mobility Overal bed mobility: Needs Assistance Bed Mobility: Rolling, Supine to Sit, Sit to Supine Rolling: Used rails, Supervision   Supine  to sit: Min assist, HOB elevated, Used rails Sit to supine: Min assist   General bed mobility comments: rolled with rails, increased time, cues for LE movement and assist at trunk to sit EOB    Transfers Overall transfer level: Needs assistance Equipment used: Rolling walker (2 wheels) Transfers: Sit to/from Stand, Bed to chair/wheelchair/BSC Sit to Stand: Max assist, +2 physical assistance           General transfer comment: Attempted sit<>stands from EOB x 2 trials, bed elevated; unable to lift hips from EOB with bil support and L foot blocked on teh floor; Discussed trying basic pivot OOB to recliner, and pt apprehensive, asked not to today; still wanting up and OOB, so we got the [facility] to assist him up Transfer via Lift Equipment: Maximove  Ambulation/Gait                   Stairs             Wheelchair Mobility     Tilt Bed    Modified Rankin (Stroke Patients Only)       Balance     Sitting balance-Leahy Scale: Fair       Standing balance-Leahy Scale: Zero                              Communication    Cognition Arousal: Alert Behavior During Therapy: WFL for tasks assessed/performed   PT - Cognitive impairments: No apparent impairments  Cueing    Exercises Other Exercises Other Exercises: modified bridging , with knees blostered, x5    General Comments General comments (skin integrity, edema, etc.): Pt very hungry, and wanting to have a sandwich before HD; discussed with RN, who ok'd getting a sandwich from the 56M refrigerator      Pertinent Vitals/Pain Pain Assessment Pain Assessment: Faces Faces Pain Scale: Hurts little more Pain Location: R residual limb Pain Descriptors / Indicators: Discomfort, Sore Pain Intervention(s): Monitored during session, Repositioned    Home Living                          Prior Function            PT Goals (current goals  can now be found in the care plan section) Acute Rehab PT Goals Patient Stated Goal: to go to rehab PT Goal Formulation: With patient Time For Goal Achievement: 04/07/23 Potential to Achieve Goals: Fair Progress towards PT goals: Progressing toward goals    Frequency    Min 3X/week      PT Plan      Co-evaluation              AM-PAC PT "6 Clicks" Mobility   Outcome Measure  Help needed turning from your back to your side while in a flat bed without using bedrails?: A Little Help needed moving from lying on your back to sitting on the side of a flat bed without using bedrails?: A Little Help needed moving to and from a bed to a chair (including a wheelchair)?: A Lot Help needed standing up from a chair using your arms (e.g., wheelchair or bedside chair)?: A Lot Help needed to walk in hospital room?: Total Help needed climbing 3-5 steps with a railing? : Total 6 Click Score: 12    End of Session Equipment Utilized During Treatment: Gait belt Activity Tolerance: Patient tolerated treatment well;Patient limited by fatigue;Patient limited by pain Patient left: in chair;with call bell/phone within reach;with chair alarm set Nurse Communication: Mobility status;Need for lift equipment PT Visit Diagnosis: Unsteadiness on feet (R26.81);Other abnormalities of gait and mobility (R26.89);Muscle weakness (generalized) (M62.81)     Time: 8469-6295 (minus about 15 minutes gettin glift equipment) PT Time Calculation (min) (ACUTE ONLY): 70 min  Charges:    $Therapeutic Activity: 53-67 mins PT General Charges $$ ACUTE PT VISIT: 1 Visit                     Van Clines, PT  Acute Rehabilitation Services Office [phone] Secure Chat welcomed    Levi Aland 04/05/2023, 2:49 PM

## 2023-04-05 NOTE — Progress Notes (Signed)
Patient ID: [name] Barns., male   DOB: March 28, 1970, 53 y.o.   MRN: [id number] Patient is seen preoperatively.  Plan for right above-knee amputation tomorrow.  Examination of the left heel he does have a superficial ischemic ulcer.  Dressing was changed to a Mepilex dressing.  Patient was also placed in the [facility] boot.  He should wear the  [facility] boot at all times.  Patient has been using his left heel to move himself in bed and has caused mechanical shear to the left heel.

## 2023-04-05 NOTE — H&P (View-Only) (Signed)
Patient ID: [name] Barns., male   DOB: March 28, 1970, 53 y.o.   MRN: [id number] Patient is seen preoperatively.  Plan for right above-knee amputation tomorrow.  Examination of the left heel he does have a superficial ischemic ulcer.  Dressing was changed to a Mepilex dressing.  Patient was also placed in the [facility] boot.  He should wear the  [facility] boot at all times.  Patient has been using his left heel to move himself in bed and has caused mechanical shear to the left heel.

## 2023-04-05 NOTE — Plan of Care (Signed)
Problem: Clinical Measurements: Goal: Ability to maintain clinical measurements within normal limits will improve Outcome: Progressing

## 2023-04-05 NOTE — Progress Notes (Signed)
[facility]                                                                               [name], is a 53 y.o. male, DOB - May 22, 1970, BJY:[id number] Admit date - 03/24/2023    Outpatient Primary MD for the patient is [name] Sax, FNP  LOS - 1  days    Brief summary   [name]. is a 53 y.o. male with past medical history significant for ESRD on HD TTS, recent right BKA ([name]), CAD, CVA, paroxysmal atrial fibrillation not on anticoagulation due to history of ICH, gastroparesis, type 2 diabetes mellitus, diabetic retinopathy, glaucoma, history of left eye enucleation who presented to [name], ED on 1/30 via EMS from dialysis unit with complaints of increased blurriness, weakness, cough and difficulty managing at home.  Patient states symptoms worse with dialysis.  Recently discharged from the hospital to SNF for 3 weeks and has been home over the last 10 days.  Patient reports is unable to take care of himself due to weakness and spouse working all the time.  Requesting placement.  MR angiogram head without contrast with no intracranial large vessel occlusion or significant stenosis.  [name] discussed with ophthalmology on-call, [name] regarding his blurred vision in the setting of known diabetic retinopathy, glaucoma and history of left eye enucleation; ophthalmology recommended no other workup at this time and vision to be monitored and outpatient follow-up with his retina specialist.  [name] consulted for admission for further evaluation management of progressive weakness with TOC involvement for placement.   Therapy evaluations recommending SNF unfortunately insurance denied SNF authorization he is currently appealing the denial. He was transferred to [facility] for evaluation of the right BKA gangrene. Plan for AKA on Wednesday.    Assessment & Plan    Assessment and Plan:   Generalized weakness/deconditioning and gait disturbances With recent  prolonged hospitalization followed by subacute rehab after right BKA by [name] on 02/09/2023.  Therapy evaluations recommending SNF, insurance denied and patient is appealing the denial.   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.  Recently seen in outpatient office visit 03/22/2023. -- Cleanse stump wound and left heel wounds with Vahse, paint both areas with betadine, top with foam  -- Apply Medihoney to the stump wound, top with saline moist gauze, and kerlix with ACE wrap.  - plan for  right AKA on Wednesday.    Left heel stage 3 pressure injury:  Dry gangrene Pain control  Wound care consulted and recommendations given.     Paroxysmal atrial fibrillation Rate controlled with amiodarone and Coreg Not on anticoagulation due to history of ICH.   Type 2 diabetes mellitus Control CBGs CBG (last 3)  Recent Labs    04/03/23 0731  GLUCAP 75      Seizure disorder Continue with Keppra 500 mg on Sunday Tuesday Thursday and Saturday and 750 mg daily on Monday Wednesday and Friday No new seizures since admission.   Anemia of chronic disease Hemoglobin around 7.1 , transfuse 1 unit of  prbc.  Hemoglobin stable around 8.3.  Transfuse to keep hemoglobin greater than 8 prior to ortho surgery.    ESRD on dialysis on TTS Patient currently gets his dialysis at [facility] clinic nephrology on board and appreciate input.   Hyperkalemia Lokelma ordered,  Repeat BMP in am.    Autonomic dysfunction Patient on midodrine 5 mg 3 times daily as needed   Lethargy  probably from benadryl injection Vital signs wnl.  Mental status improved later in the day and back to baseline this afternoon.    Pressure injury :  Pressure Injury 03/25/23 Thigh Right Unstageable - Full thickness tissue loss in which the base of the injury is covered by slough (yellow, tan, gray, green or  brown) and/or eschar (tan, brown or black) in the wound bed. (Active)  03/25/23 1200  Location: Thigh  Location Orientation: Right  Staging: Unstageable - Full thickness tissue loss in which the base of the injury is covered by slough (yellow, tan, gray, green or brown) and/or eschar (tan, brown or black) in the wound bed.  Wound Description (Comments):   Present on Admission: Yes     Pressure Injury 03/25/23 Other (Comment) Right Unstageable - Full thickness tissue loss in which the base of the injury is covered by slough (yellow, tan, gray, green or brown) and/or eschar (tan, brown or black) in the wound bed. (Active)  03/25/23 1200  Location: Other (Comment) (stump)  Location Orientation: Right  Staging: Unstageable - Full thickness tissue loss in which the base of the injury is covered by slough (yellow, tan, gray, green or brown) and/or eschar (tan, brown or black) in the wound bed.  Wound Description (Comments):   Present on Admission: Yes     Pressure Injury 03/25/23 Heel Left;Lateral Stage 2 -  Partial thickness loss of dermis presenting as a shallow open injury with a red, pink wound bed without slough. (Active)  03/25/23 1200  Location: Heel  Location Orientation: Left;Lateral  Staging: Stage 2 -  Partial thickness loss of dermis presenting as a shallow open injury with a red, pink wound bed without slough.  Wound Description (Comments):   Present on Admission: Yes     Pressure Injury 03/25/23 Heel Left;Medial Deep Tissue Pressure Injury - Purple or maroon localized area of discolored intact skin or blood-filled blister due to damage of underlying soft tissue from pressure and/or shear. (Active)  03/25/23 1200  Location: Heel  Location Orientation: Left;Medial  Staging: Deep Tissue Pressure Injury - Purple or maroon localized area of discolored intact skin or blood-filled blister due to damage of underlying soft tissue from pressure and/or shear.  Wound Description (Comments):    Present on Admission: Yes   Wound care consulted and recommendations given.      Estimated body mass index is 20.23 kg/m as calculated from the following:   Height as of this encounter: 6\' 3"  (1.905 m).   Weight as of this encounter: 73.4 kg.  Code Status: Full code DVT Prophylaxis:  SCDs Start: 03/24/23 2107   Level of Care: Level of care: Med-Surg Family Communication: None at bedside  Disposition Plan:     Remains inpatient appropriate: AKA scheduled on Wednesday.  Procedures:  None  Consultants:   Nephrology.  Orthopedics Dr [name].  Antimicrobials:   Anti-infectives (From admission, onward)    None        Medications  Scheduled Meds:  (feeding supplement) PROSource Plus  30 mL Oral TID BM   amiodarone  200 mg Oral  Daily   aspirin EC  81 mg Oral Daily   atorvastatin  80 mg Oral QHS   calcitRIOL  2.25 mcg Oral Q T,Th,Sat-1800   calcium acetate  1,334 mg Oral TID WC   carvedilol  12.5 mg Oral BID   cinacalcet  60 mg Oral Q T,Th,Sat-1800   darbepoetin (ARANESP) injection - DIALYSIS  150 mcg Subcutaneous Q Tue-1800   dorzolamide-timolol  1 drop Right Eye BID   leptospermum manuka honey  1 Application Topical Daily   levETIRAcetam  500 mg Oral Once per day on Sunday Monday Wednesday Friday   levETIRAcetam  750 mg Oral Once per day on Tuesday Thursday Saturday   lidocaine-prilocaine  1 Application Topical Once per day on Monday Wednesday Friday   multivitamin  1 tablet Oral QHS   mupirocin ointment  1 Application Nasal BID   nutrition supplement (JUVEN)  1 packet Oral BID BM   pneumococcal 20-valent conjugate vaccine  0.5 mL Intramuscular Tomorrow-1000   sodium zirconium cyclosilicate  10 g Oral BID   Continuous Infusions: PRN Meds:.acetaminophen **OR** acetaminophen, hydrALAZINE, loperamide, methocarbamol (ROBAXIN) injection, midodrine, mouth rinse, oxyCODONE    Subjective:   Ninetta Lights was seen and examined today. No new complaints.  Objective:    Vitals:   04/04/23 1650 04/04/23 1654 04/05/23 0506 04/05/23 0741  BP: (!) 155/82 125/61 123/67 (!) 151/73  Pulse: 61 72 (!) 57 (!) 58  Resp: 18 18 20 16   Temp: 97.6 F (36.4 C) 97.6 F (36.4 C) 98.1 F (36.7 C) 98.1 F (36.7 C)  TempSrc: Oral  Oral Oral  SpO2: 100% 100% 99% 100%  Weight:      Height:        Intake/Output Summary (Last 24 hours) at 04/05/2023 1306 Last data filed at 04/05/2023 0500 Gross per 24 hour  Intake 360 ml  Output 0 ml  Net 360 ml   Filed Weights   03/31/23 1044 03/31/23 1458 04/02/23 1500  Weight: 73.8 kg 72.7 kg 73.4 kg     Exam General exam: alert and comfortable.  Respiratory system: Clear to auscultation. Respiratory effort normal. Cardiovascular system: S1 & S2 heard, RRR.  Gastrointestinal system: Abdomen is nondistended, soft and nontender. Central nervous system: Alert and oriented.  Extremities: Symmetric 5 x 5 power. Skin: stage 3 pressure injury on the left heel. Right BKA Stump gangrene. Psychiatry:  Mood & affect appropriate.     Data Reviewed:  I have personally reviewed following labs and imaging studies   CBC Lab Results  Component Value Date   WBC 6.9 04/05/2023   RBC 3.51 (L) 04/05/2023   HGB 8.0 (L) 04/05/2023   HCT 26.5 (L) 04/05/2023   MCV 75.5 (L) 04/05/2023   MCH 22.8 (L) 04/05/2023   PLT 180 04/05/2023   MCHC 30.2 04/05/2023   RDW 20.5 (H) 04/05/2023   LYMPHSABS 1.4 04/05/2023   MONOABS 0.8 04/05/2023   EOSABS 0.7 (H) 04/05/2023   BASOSABS 0.1 04/05/2023     Last metabolic panel Lab Results  Component Value Date   NA 142 04/05/2023   K 5.2 (H) 04/05/2023   CL 103 04/05/2023   CO2 24 04/05/2023   BUN 139 (H) 04/05/2023   CREATININE 10.47 (H) 04/05/2023   GLUCOSE 75 04/05/2023   GFRNONAA 5 (L) 04/05/2023   GFRAA 5 (L) 08/27/2019   CALCIUM 9.0 04/05/2023   PHOS 6.9 (H) 04/05/2023   PROT 6.4 (L) 03/24/2023   ALBUMIN 2.5 (L) 04/05/2023   LABGLOB 2.9 08/19/2016  AGRATIO 0.9 08/19/2016    BILITOT 0.7 03/24/2023   ALKPHOS 62 03/24/2023   AST 13 (L) 03/24/2023   ALT 14 03/24/2023   ANIONGAP 15 04/05/2023    CBG (last 3)  Recent Labs    04/03/23 0731  GLUCAP 75      Coagulation Profile: No results for input(s): "INR", "PROTIME" in the last 168 hours.   Radiology Studies: MR FOOT LEFT WO CONTRAST Result Date: 04/05/2023 CLINICAL DATA:  Osteomyelitis, foot dry gangrene EXAM: MRI OF THE LEFT FOOT WITHOUT CONTRAST TECHNIQUE: Multiplanar, multisequence MR imaging of the left forefoot was performed. No intravenous contrast was administered. COMPARISON:  None recent. Left foot radiographs 05/25/2022 and 06/11/2016. MRI of the left ankle 08/04/2016. FINDINGS: Bones/Joint/Cartilage Status post remote amputation of the great toe at the metatarsophalangeal joint. No significant abnormality of the 1st metatarsal head. 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 There are mild secondary degenerative changes. There is abnormal T2 hyperintensity within the 2nd proximal phalanx with the suggestion of a subacute fracture based on the short axis transverse axial images. No other significant marrow signal abnormalities identified in the forefoot. There is mild subchondral edema proximally in the navicular, likely reactive. Ligaments Intact Lisfranc ligament. The collateral ligaments of the remaining metatarsophalangeal joints appear intact. Muscles and Tendons Mild nonspecific generalized muscular atrophy and edema. There is a small amount of fluid surrounding the flexor digitorum longus tendons proximally in the forefoot. No significant distal tenosynovitis. Soft tissues Chronic linear metallic foreign body within the soft tissues lateral to the 5th metatarsal head with associated susceptibility artifact, unchanged from the most recent radiographs. This limits evaluation of the surrounding soft tissues. No  obvious skin ulceration or focal fluid collection identified. IMPRESSION: 1. 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 2. Abnormal T2 hyperintensity within the 2nd proximal phalanx with the suggestion of a subacute fracture. Recommend plain film correlation. 3. No findings highly suspicious for osteomyelitis in the forefoot. Recommend clinical and radiographic correlation. 4. Chronic linear metallic foreign body within the soft tissues lateral to the 5th metatarsal head with associated susceptibility artifact, unchanged from the most recent radiographs. This limits evaluation of the surrounding soft tissues. No obvious skin ulceration or focal fluid collection identified. 5. Mild nonspecific generalized muscular atrophy and edema. 6. Small amount of fluid surrounding the flexor digitorum longus tendons proximally in the forefoot, nonspecific. Electronically Signed   By: Carey Bullocks M.D.   On: 04/05/2023 08:39       Kathlen Mody M.D. [facility] 04/05/2023, 1:06 PM  Available via Epic secure chat 7am-7pm After 7 pm, please refer to night coverage provider listed on amion.

## 2023-04-05 NOTE — Progress Notes (Signed)
[facility] Progress Note   Subjective:    Seen and examined patient at bedside. Plan for R AKA tomorrow by [name]. Still c/o his diet but otherwise feels ok. Plan for HD today.  Objective Vitals:   04/04/23 1650 04/04/23 1654 04/05/23 0506 04/05/23 0741  BP: (!) 155/82 125/61 123/67 (!) 151/73  Pulse: 61 72 (!) 57 (!) 58  Resp: 18 18 20 16   Temp: 97.6 F (36.4 C) 97.6 F (36.4 C) 98.1 F (36.7 C) 98.1 F (36.7 C)  TempSrc: Oral  Oral Oral  SpO2: 100% 100% 99% 100%  Weight:      Height:       Physical Exam General: Alert chronically ill male NAD Heart: RRR no MRG Lungs: CTA bilaterally nonlabored breathing Abdomen: Soft and non-tender Extremities: No pedal edema, R BKA dressing dry clear, no pedal edema Dialysis Access:  LU A  AVG + bruit  Filed Weights   03/31/23 1044 03/31/23 1458 04/02/23 1500  Weight: 73.8 kg 72.7 kg 73.4 kg    Intake/Output Summary (Last 24 hours) at 04/05/2023 1036 Last data filed at 04/05/2023 0500 Gross per 24 hour  Intake 360 ml  Output 0 ml  Net 360 ml    Additional Objective Labs: Basic Metabolic Panel: Recent Labs  Lab 03/31/23 1055 04/01/23 0924 04/03/23 0151 04/04/23 0356 04/04/23 1134 04/05/23 0352  NA 139   < > 137 139  --  142  K 6.8*   < > 4.4 5.2* 4.9 5.2*  CL 103   < > 99 96*  --  103  CO2 24   < > 27 25  --  24  GLUCOSE 89   < > 106* 96  --  75  BUN 62*   < > 68* 115*  --  139*  CREATININE 9.02*   < > 6.56* 9.32*  --  10.47*  CALCIUM 8.9   < > 8.6* 9.1  --  9.0  PHOS 5.2*  --   --  6.6*  --  6.9*   < > = values in this interval not displayed.   Liver Function Tests: Recent Labs  Lab 03/31/23 1055 04/04/23 0356 04/05/23 0352  ALBUMIN 2.7* 2.5* 2.5*   No results for input(s): "LIPASE", "AMYLASE" in the last 168 hours. CBC: Recent Labs  Lab 03/31/23 1056 04/02/23 1438 04/03/23 0955 04/04/23 0356 04/05/23 0352  WBC 6.5 7.8 6.6 7.8 6.9  NEUTROABS  --  4.5 3.4  --  3.9  HGB 7.5* 7.8*  7.1* 8.3* 8.0*  HCT 25.4* 26.5* 24.4* 27.6* 26.5*  MCV 75.6* 75.5* 74.8* 75.6* 75.5*  PLT 183 187 179 187 180   Blood Culture    Component Value Date/Time   SDES TISSUE 02/04/2023 1206   SDES WOUND 02/04/2023 1206   SPECREQUEST RIGHT FOOT 02/04/2023 1206   SPECREQUEST RIGHT FOOT 02/04/2023 1206   CULT  02/04/2023 1206    ABUNDANT STAPHYLOCOCCUS AUREUS MODERATE CORYNEBACTERIUM STRIATUM Standardized susceptibility testing for this organism is not available. NO ANAEROBES ISOLATED Performed at [facility], [address], [place], [place] [address]    CULT  02/04/2023 1206    ABUNDANT STAPHYLOCOCCUS AUREUS SUSCEPTIBILITIES PERFORMED ON PREVIOUS CULTURE WITHIN THE LAST 5 DAYS. MODERATE CORYNEBACTERIUM STRIATUM Standardized susceptibility testing for this organism is not available. NO ANAEROBES ISOLATED Performed at [facility], [address], [place], [place] [address]    REPTSTATUS 02/09/2023 FINAL 02/04/2023 1206   REPTSTATUS 02/09/2023 FINAL 02/04/2023 1206  Cardiac Enzymes: No results for input(s): "CKTOTAL", "CKMB", "CKMBINDEX", "TROPONINI" in the last 168 hours. CBG: Recent Labs  Lab 03/31/23 1440 04/03/23 0731  GLUCAP 86 75   Iron Studies: No results for input(s): "IRON", "TIBC", "TRANSFERRIN", "FERRITIN" in the last 72 hours. Lab Results  Component Value Date   INR 1.1 03/01/2022   INR 1.6 (H) 11/21/2021   INR 1.3 (H) 08/12/2021   Studies/Results: MR FOOT LEFT WO CONTRAST Result Date: 04/05/2023 CLINICAL DATA:  Osteomyelitis, foot dry gangrene EXAM: MRI OF THE LEFT FOOT WITHOUT CONTRAST TECHNIQUE: Multiplanar, multisequence MR imaging of the left forefoot was performed. No intravenous contrast was administered. COMPARISON:  None recent. Left foot radiographs 05/25/2022 and 06/11/2016. MRI of the left ankle 08/04/2016. FINDINGS: Bones/Joint/Cartilage Status post remote amputation of the great toe at the metatarsophalangeal joint. No significant  abnormality of the 1st metatarsal head. 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 There are mild secondary degenerative changes. There is abnormal T2 hyperintensity within the 2nd proximal phalanx with the suggestion of a subacute fracture based on the short axis transverse axial images. No other significant marrow signal abnormalities identified in the forefoot. There is mild subchondral edema proximally in the navicular, likely reactive. Ligaments Intact Lisfranc ligament. The collateral ligaments of the remaining metatarsophalangeal joints appear intact. Muscles and Tendons Mild nonspecific generalized muscular atrophy and edema. There is a small amount of fluid surrounding the flexor digitorum longus tendons proximally in the forefoot. No significant distal tenosynovitis. Soft tissues Chronic linear metallic foreign body within the soft tissues lateral to the 5th metatarsal head with associated susceptibility artifact, unchanged from the most recent radiographs. This limits evaluation of the surrounding soft tissues. No obvious skin ulceration or focal fluid collection identified. IMPRESSION: 1. 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 2. Abnormal T2 hyperintensity within the 2nd proximal phalanx with the suggestion of a subacute fracture. Recommend plain film correlation. 3. No findings highly suspicious for osteomyelitis in the forefoot. Recommend clinical and radiographic correlation. 4. Chronic linear metallic foreign body within the soft tissues lateral to the 5th metatarsal head with associated susceptibility artifact, unchanged from the most recent radiographs. This limits evaluation of the surrounding soft tissues. No obvious skin ulceration or focal fluid collection identified. 5. Mild nonspecific generalized muscular atrophy and  edema. 6. Small amount of fluid surrounding the flexor digitorum longus tendons proximally in the forefoot, nonspecific. Electronically Signed   By: Carey Bullocks M.D.   On: 04/05/2023 08:39    Medications:   (feeding supplement) PROSource Plus  30 mL Oral TID BM   amiodarone  200 mg Oral Daily   aspirin EC  81 mg Oral Daily   atorvastatin  80 mg Oral QHS   calcitRIOL  2.25 mcg Oral Q T,Th,Sat-1800   calcium acetate  1,334 mg Oral TID WC   carvedilol  12.5 mg Oral BID   cinacalcet  60 mg Oral Q T,Th,Sat-1800   darbepoetin (ARANESP) injection - DIALYSIS  150 mcg Subcutaneous Q Tue-1800   dorzolamide-timolol  1 drop Right Eye BID   leptospermum manuka honey  1 Application Topical Daily   levETIRAcetam  500 mg Oral Once per day on Sunday Monday Wednesday Friday   levETIRAcetam  750 mg Oral Once per day on Tuesday Thursday Saturday   lidocaine-prilocaine  1 Application Topical Once per day on Monday Wednesday Friday  multivitamin  1 tablet Oral QHS   mupirocin ointment  1 Application Nasal BID   nutrition supplement (JUVEN)  1 packet Oral BID BM   pneumococcal 20-valent conjugate vaccine  0.5 mL Intramuscular Tomorrow-1000   sodium zirconium cyclosilicate  10 g Oral BID    Dialysis Orders: [facility] TTS 3h  72kg  B400  LUA AVF  Heparin 1000+ 400 u/hr  Assessment/Plan: Right BKA with painful gangrenous wound: per [name] plans for right AKA tomorrow. Antibiotics per admit team/[name] ESRD -HD TTS. Next today. General Weakness/debility: secondary #1 patient family unable to care for patient SNF recommended Hyperkalemia: K+ 5.2. He's upset with the renal diet and currently on carb mod /renal diet. He admits to eating outside foods. I advised following the proper diet and limiting outside foods to keep his K+ under control. Follow trend closely. May need to consider Lokelma but will hold off for now. HD today and checking labs in AM. HTN/volume: no excess volume on exam  . BP okay. On midodrine 5 mg 3 times daily.  Anemia: Hgb 7.1. Not reported outpatient ESA. Given Aranesp 150 mcg 02/04  q Tues. subcu while here Secondary hyperparathyroidism: corrected calcium okay, phosphorus 5.2, on calcitriol, Sensipar, and calcium acetate as binder Atrial fibrillation: on amiodarone and Coreg DM T2= per admit  Salome Holmes, NP [facility] 04/05/2023,10:36 AM  LOS: 1 day

## 2023-04-06 ENCOUNTER — Inpatient Hospital Stay (HOSPITAL_COMMUNITY): Payer: Medicare HMO | Admitting: Anesthesiology

## 2023-04-06 ENCOUNTER — Encounter (HOSPITAL_COMMUNITY): Payer: Self-pay | Admitting: Internal Medicine

## 2023-04-06 ENCOUNTER — Other Ambulatory Visit: Payer: Self-pay

## 2023-04-06 ENCOUNTER — Encounter (HOSPITAL_COMMUNITY)
Admission: EM | Disposition: A | Discharge status: Skilled Nursing Facility | Payer: Self-pay | Source: Home / Self Care | Attending: Internal Medicine

## 2023-04-06 DIAGNOSIS — Z992 Dependence on renal dialysis: Secondary | ICD-10-CM | POA: Diagnosis not present

## 2023-04-06 DIAGNOSIS — I2510 Atherosclerotic heart disease of native coronary artery without angina pectoris: Secondary | ICD-10-CM | POA: Diagnosis not present

## 2023-04-06 DIAGNOSIS — E1152 Type 2 diabetes mellitus with diabetic peripheral angiopathy with gangrene: Secondary | ICD-10-CM | POA: Diagnosis not present

## 2023-04-06 DIAGNOSIS — D638 Anemia in other chronic diseases classified elsewhere: Secondary | ICD-10-CM | POA: Diagnosis not present

## 2023-04-06 DIAGNOSIS — I132 Hypertensive heart and chronic kidney disease with heart failure and with stage 5 chronic kidney disease, or end stage renal disease: Secondary | ICD-10-CM

## 2023-04-06 DIAGNOSIS — T8781 Dehiscence of amputation stump: Secondary | ICD-10-CM | POA: Diagnosis not present

## 2023-04-06 DIAGNOSIS — E1165 Type 2 diabetes mellitus with hyperglycemia: Secondary | ICD-10-CM | POA: Diagnosis not present

## 2023-04-06 DIAGNOSIS — Z794 Long term (current) use of insulin: Secondary | ICD-10-CM

## 2023-04-06 DIAGNOSIS — N186 End stage renal disease: Secondary | ICD-10-CM | POA: Diagnosis not present

## 2023-04-06 DIAGNOSIS — I5032 Chronic diastolic (congestive) heart failure: Secondary | ICD-10-CM | POA: Diagnosis not present

## 2023-04-06 DIAGNOSIS — H538 Other visual disturbances: Secondary | ICD-10-CM

## 2023-04-06 DIAGNOSIS — R531 Weakness: Secondary | ICD-10-CM | POA: Diagnosis not present

## 2023-04-06 DIAGNOSIS — R5381 Other malaise: Secondary | ICD-10-CM | POA: Diagnosis not present

## 2023-04-06 HISTORY — PX: AMPUTATION: SHX166

## 2023-04-06 LAB — POCT I-STAT, CHEM 8
BUN: 105 mg/dL — ABNORMAL HIGH (ref 6–20)
Calcium, Ion: 1.1 mmol/L — ABNORMAL LOW (ref 1.15–1.40)
Chloride: 97 mmol/L — ABNORMAL LOW (ref 98–111)
Creatinine, Ser: 10.1 mg/dL — ABNORMAL HIGH (ref 0.61–1.24)
Glucose, Bld: 80 mg/dL (ref 70–99)
HCT: 26 % — ABNORMAL LOW (ref 39.0–52.0)
Hemoglobin: 8.8 g/dL — ABNORMAL LOW (ref 13.0–17.0)
Potassium: 4.5 mmol/L (ref 3.5–5.1)
Sodium: 134 mmol/L — ABNORMAL LOW (ref 135–145)
TCO2: 27 mmol/L (ref 22–32)

## 2023-04-06 LAB — CBC
HCT: 25.5 % — ABNORMAL LOW (ref 39.0–52.0)
Hemoglobin: 7.8 g/dL — ABNORMAL LOW (ref 13.0–17.0)
MCH: 22.8 pg — ABNORMAL LOW (ref 26.0–34.0)
MCHC: 30.6 g/dL (ref 30.0–36.0)
MCV: 74.6 fL — ABNORMAL LOW (ref 80.0–100.0)
Platelets: 182 K/uL (ref 150–400)
RBC: 3.42 MIL/uL — ABNORMAL LOW (ref 4.22–5.81)
RDW: 20.5 % — ABNORMAL HIGH (ref 11.5–15.5)
WBC: 8.1 K/uL (ref 4.0–10.5)
nRBC: 0 % (ref 0.0–0.2)

## 2023-04-06 LAB — GLUCOSE, CAPILLARY: Glucose-Capillary: 75 mg/dL (ref 70–99)

## 2023-04-06 SURGERY — AMPUTATION, ABOVE KNEE
Anesthesia: General | Site: Knee | Laterality: Right

## 2023-04-06 MED ORDER — JUVEN PO PACK
1.0000 | PACK | Freq: Two times a day (BID) | ORAL | Status: DC
Start: 2023-04-07 — End: 2023-04-06

## 2023-04-06 MED ORDER — 0.9 % SODIUM CHLORIDE (POUR BTL) OPTIME
TOPICAL | Status: DC | PRN
  Administered 2023-04-06: 1000 mL

## 2023-04-06 MED ORDER — HYDRALAZINE HCL 20 MG/ML IJ SOLN: 10.0000 mg | Freq: Four times a day (QID) | INTRAMUSCULAR | Status: DC | PRN

## 2023-04-06 MED ORDER — PROPOFOL 10 MG/ML IV BOLUS
INTRAVENOUS | Status: DC | PRN
  Administered 2023-04-06: 200 mg via INTRAVENOUS

## 2023-04-06 MED ORDER — FENTANYL CITRATE PF 50 MCG/ML IJ SOSY
25.0000 ug | PREFILLED_SYRINGE | Freq: Once | INTRAMUSCULAR | Status: AC
  Administered 2023-04-06: 25 ug via INTRAVENOUS
  Filled 2023-04-06: qty 1

## 2023-04-06 MED ORDER — CHLORHEXIDINE GLUCONATE CLOTH 2 % EX PADS: 6.0000 | MEDICATED_PAD | Freq: Every day | CUTANEOUS | Status: DC

## 2023-04-06 MED ORDER — OXYCODONE HCL 5 MG PO TABS: 5.0000 mg | ORAL_TABLET | Freq: Once | ORAL | Status: DC | PRN

## 2023-04-06 MED ORDER — PROPOFOL 10 MG/ML IV BOLUS
INTRAVENOUS | Status: AC
  Filled 2023-04-06: qty 20

## 2023-04-06 MED ORDER — MIDAZOLAM HCL 2 MG/2ML IJ SOLN
INTRAMUSCULAR | Status: AC
  Filled 2023-04-06: qty 2

## 2023-04-06 MED ORDER — HYDRALAZINE HCL 20 MG/ML IJ SOLN
5.0000 mg | Freq: Four times a day (QID) | INTRAMUSCULAR | Status: DC | PRN
  Filled 2023-04-06: qty 1

## 2023-04-06 MED ORDER — FENTANYL CITRATE (PF) 250 MCG/5ML IJ SOLN
INTRAMUSCULAR | Status: AC
  Filled 2023-04-06: qty 5

## 2023-04-06 MED ORDER — SODIUM CHLORIDE 0.9 % IV SOLN: INTRAVENOUS | Status: DC

## 2023-04-06 MED ORDER — KETAMINE HCL 10 MG/ML IJ SOLN
INTRAMUSCULAR | Status: DC | PRN
  Administered 2023-04-06: 10 mg via INTRAVENOUS

## 2023-04-06 MED ORDER — SODIUM CHLORIDE 0.9 % IV SOLN: INTRAVENOUS | Status: DC | PRN

## 2023-04-06 MED ORDER — AMISULPRIDE (ANTIEMETIC) 5 MG/2ML IV SOLN: 10.0000 mg | Freq: Once | INTRAVENOUS | Status: DC | PRN

## 2023-04-06 MED ORDER — ORAL CARE MOUTH RINSE: 15.0000 mL | Freq: Once | OROMUCOSAL | Status: AC

## 2023-04-06 MED ORDER — HYDROMORPHONE HCL 1 MG/ML IJ SOLN
0.2500 mg | INTRAMUSCULAR | Status: DC | PRN
  Administered 2023-04-06: 0.5 mg via INTRAVENOUS

## 2023-04-06 MED ORDER — FENTANYL CITRATE (PF) 250 MCG/5ML IJ SOLN
INTRAMUSCULAR | Status: DC | PRN
  Administered 2023-04-06 (×2): 50 ug via INTRAVENOUS

## 2023-04-06 MED ORDER — KETAMINE HCL 50 MG/5ML IJ SOSY
PREFILLED_SYRINGE | INTRAMUSCULAR | Status: AC
  Filled 2023-04-06: qty 5

## 2023-04-06 MED ORDER — OXYCODONE HCL 5 MG/5ML PO SOLN: 5.0000 mg | Freq: Once | ORAL | Status: DC | PRN

## 2023-04-06 MED ORDER — VITAMIN C 500 MG PO TABS
1000.0000 mg | ORAL_TABLET | Freq: Every day | ORAL | Status: DC
  Administered 2023-04-06 – 2023-04-14 (×9): 1000 mg via ORAL
  Filled 2023-04-06 (×9): qty 2

## 2023-04-06 MED ORDER — ZINC SULFATE 220 (50 ZN) MG PO CAPS
220.0000 mg | ORAL_CAPSULE | Freq: Every day | ORAL | Status: DC
  Administered 2023-04-06 – 2023-04-14 (×9): 220 mg via ORAL
  Filled 2023-04-06 (×9): qty 1

## 2023-04-06 MED ORDER — CHLORHEXIDINE GLUCONATE 0.12 % MT SOLN: 15.0000 mL | Freq: Once | OROMUCOSAL | Status: AC

## 2023-04-06 MED ORDER — CHLORHEXIDINE GLUCONATE 0.12 % MT SOLN
OROMUCOSAL | Status: AC
  Administered 2023-04-06: 15 mL via OROMUCOSAL
  Filled 2023-04-06: qty 15

## 2023-04-06 MED ORDER — POLYVINYL ALCOHOL 1.4 % OP SOLN
1.0000 [drp] | Freq: Once | OPHTHALMIC | Status: AC
  Administered 2023-04-07: 1 [drp] via OPHTHALMIC
  Filled 2023-04-06: qty 15

## 2023-04-06 MED ORDER — HYDROMORPHONE HCL 1 MG/ML IJ SOLN
INTRAMUSCULAR | Status: AC
Start: 2023-04-06 — End: 2023-04-07
  Filled 2023-04-06: qty 1

## 2023-04-06 SURGICAL SUPPLY — 36 items
BAG COUNTER SPONGE SURGICOUNT (BAG) IMPLANT
BLADE SAW RECIP 87.9 MT (BLADE) ×1 IMPLANT
BLADE SURG 21 STRL SS (BLADE) ×1 IMPLANT
BNDG COHESIVE 6X5 TAN ST LF (GAUZE/BANDAGES/DRESSINGS) ×1 IMPLANT
CANISTER WOUND CARE 500ML ATS (WOUND CARE) IMPLANT
COVER SURGICAL LIGHT HANDLE (MISCELLANEOUS) ×1 IMPLANT
CUFF TRNQT CYL 34X4.125X (TOURNIQUET CUFF) IMPLANT
DRAPE DERMATAC (DRAPES) IMPLANT
DRAPE INCISE IOBAN 66X45 STRL (DRAPES) ×2 IMPLANT
DRAPE U-SHAPE 47X51 STRL (DRAPES) ×1 IMPLANT
DRESSING PREVENA PLUS CUSTOM (GAUZE/BANDAGES/DRESSINGS) ×1 IMPLANT
DRSG PREVENA PLUS CUSTOM (GAUZE/BANDAGES/DRESSINGS) ×1 IMPLANT
DURAPREP 26ML APPLICATOR (WOUND CARE) ×1 IMPLANT
ELECT REM PT RETURN 9FT ADLT (ELECTROSURGICAL) ×1 IMPLANT
ELECTRODE REM PT RTRN 9FT ADLT (ELECTROSURGICAL) ×1 IMPLANT
GLOVE BIOGEL PI IND STRL 7.5 (GLOVE) ×1 IMPLANT
GLOVE BIOGEL PI IND STRL 9 (GLOVE) ×1 IMPLANT
GLOVE SURG ORTHO 9.0 STRL STRW (GLOVE) ×1 IMPLANT
GLOVE SURG SS PI 6.5 STRL IVOR (GLOVE) ×1 IMPLANT
GOWN STRL REUS W/ TWL LRG LVL3 (GOWN DISPOSABLE) ×1 IMPLANT
GOWN STRL REUS W/ TWL XL LVL3 (GOWN DISPOSABLE) ×2 IMPLANT
KIT BASIN OR (CUSTOM PROCEDURE TRAY) ×1 IMPLANT
KIT TURNOVER KIT B (KITS) ×1 IMPLANT
MANIFOLD NEPTUNE II (INSTRUMENTS) ×1 IMPLANT
NS IRRIG 1000ML POUR BTL (IV SOLUTION) ×1 IMPLANT
PACK ORTHO EXTREMITY (CUSTOM PROCEDURE TRAY) ×1 IMPLANT
PAD ARMBOARD 7.5X6 YLW CONV (MISCELLANEOUS) ×1 IMPLANT
PREVENA RESTOR ARTHOFORM 46X30 (CANNISTER) ×1 IMPLANT
STAPLER VISISTAT 35W (STAPLE) IMPLANT
STOCKINETTE IMPERVIOUS LG (DRAPES) IMPLANT
SUT ETHILON 2 0 PSLX (SUTURE) ×2 IMPLANT
SUT SILK 2-0 18XBRD TIE 12 (SUTURE) ×1 IMPLANT
SUT VIC AB 1 CTX36XBRD ANBCTRL (SUTURE) IMPLANT
TOWEL GREEN STERILE FF (TOWEL DISPOSABLE) ×1 IMPLANT
TUBE CONNECTING 20X1/4 (TUBING) ×1 IMPLANT
YANKAUER SUCT BULB TIP NO VENT (SUCTIONS) ×1 IMPLANT

## 2023-04-06 NOTE — Progress Notes (Signed)
[facility]                                                                              [name], is a 53 y.o. male, DOB - 1971-02-21, HYQ:[id number] Admit date - 03/24/2023    Outpatient Primary MD for the patient is [name], FNP  LOS - 2  days  Chief Complaint  Patient presents with   Weakness       Brief summary   Patient is a 53 y.o. male with ESRD on HD TTS, recent right BKA ([name]), CAD, CVA, paroxysmal atrial fibrillation not on anticoagulation due to history of ICH, gastroparesis, DM type II, diabetic retinopathy, glaucoma, history of left eye enucleation who presented to [name], ED on 1/30 via EMS from dialysis unit with complaints of increased blurriness, weakness, cough and difficulty managing at home.  Patient states symptoms worse with dialysis.  Recently discharged from the hospital to SNF for 3 weeks and has been home over the last 10 days.  Patient reports is unable to take care of himself due to weakness and spouse working all the time.  Requesting placement.  MRA showed no intracranial large vessel occlusion or significant stenosis.  [name] discussed with ophthalmology on-call, [name] regarding his blurred vision in the setting of known diabetic retinopathy, glaucoma and history of left eye enucleation; ophthalmology recommended no other workup at this time and vision to be monitored and outpatient follow-up with his retina specialist.   [name] consulted for admission for further evaluation management of progressive weakness with TOC involvement for placement.  PT recommended SNF however insurance denied SNF authorization he is currently appealing the denial. He was transferred to [facility] for evaluation of the right BKA gangrene.  Plan for AKA on Wednesday   Assessment & Plan      Generalized weakness/deconditioning and gait disturbances -With recent prolonged hospitalization followed by subacute rehab after right BKA by [name] on  02/09/2023.   -Therapy evaluations recommended SNF however was denied by the insurance, patient is now appealing the denial.  Unable to care for himself at home, was briefly at home for 10 days after discharge from SNF.     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.  Recently seen in outpatient office visit 03/22/2023. -Dressing changes and wound care were recommended, plan for right AKA today     Paroxysmal atrial fibrillation -Continue rate control with amiodarone, Coreg -Not on anticoagulation due to history of ICH    Diabetes mellitus type 2, with end-stage renal disease, peripheral vascular disease -CBGs currently controlled -Not on insulin   Essential hypertension -Continue Coreg, hydralazine IV as needed with parameters   Seizure disorder -Continue with Keppra 500 mg on Sunday Tuesday Thursday and Saturday and 750 mg daily on Monday Wednesday and Friday -No new seizures since admission.     Anemia of chronic disease -H&H currently stable, 8.0 on 2/11, will repeat CBC -May need packed RBC transfusion in a.m.     ESRD on dialysis on TTS -  Nephrology consulted, undergoing HD  -Receives outpatient dialysis at [facility] clinic      Hyperkalemia Recheck bmet in a.m.     Autonomic dysfunction Continue midodrine    Pressure injury documentation Right thigh unstageable, POA Left heel, lateral, stage II, POA Left heel medial, deep tissue injury, POA -Wound care per nursing  Pressure Injury 03/25/23 Thigh Right Unstageable - Full thickness tissue loss in which the base of the injury is covered by slough (yellow, tan, gray, green or brown) and/or eschar (tan, brown or black) in the wound bed. (Active)  03/25/23 1200  Location: Thigh  Location Orientation: Right  Staging: Unstageable - Full thickness tissue loss in which the base of the injury is covered by  slough (yellow, tan, gray, green or brown) and/or eschar (tan, brown or black) in the wound bed.  Wound Description (Comments):   Present on Admission: Yes     Pressure Injury 03/25/23 Other (Comment) Right Unstageable - Full thickness tissue loss in which the base of the injury is covered by slough (yellow, tan, gray, green or brown) and/or eschar (tan, brown or black) in the wound bed. (Active)  03/25/23 1200  Location: Other (Comment) (stump)  Location Orientation: Right  Staging: Unstageable - Full thickness tissue loss in which the base of the injury is covered by slough (yellow, tan, gray, green or brown) and/or eschar (tan, brown or black) in the wound bed.  Wound Description (Comments):   Present on Admission: Yes     Pressure Injury 03/25/23 Heel Left;Lateral Stage 2 -  Partial thickness loss of dermis presenting as a shallow open injury with a red, pink wound bed without slough. (Active)  03/25/23 1200  Location: Heel  Location Orientation: Left;Lateral  Staging: Stage 2 -  Partial thickness loss of dermis presenting as a shallow open injury with a red, pink wound bed without slough.  Wound Description (Comments):   Present on Admission: Yes     Pressure Injury 03/25/23 Heel Left;Medial Deep Tissue Pressure Injury - Purple or maroon localized area of discolored intact skin or blood-filled blister due to damage of underlying soft tissue from pressure and/or shear. (Active)  03/25/23 1200  Location: Heel  Location Orientation: Left;Medial  Staging: Deep Tissue Pressure Injury - Purple or maroon localized area of discolored intact skin or blood-filled blister due to damage of underlying soft tissue from pressure and/or shear.  Wound Description (Comments):   Present on Admission: Yes        Estimated body mass index is 22.04 kg/m as calculated from the following:   Height as of this encounter: 6\' 3"  (1.905 m).   Weight as of this encounter: 80 kg.  Code Status: Full  code DVT Prophylaxis:  SCDs Start: 03/24/23 2107   Level of Care: Level of care: Med-Surg Family Communication: Updated patient Disposition Plan:      Remains inpatient appropriate: Plan for right AKA today, will need SNF   Procedures:    Consultants:   Orthopedics  Antimicrobials:   Anti-infectives (From admission, onward)    Start     Dose/Rate Route Frequency Ordered Stop   04/06/23 0600  ceFAZolin (ANCEF) IVPB 2g/100 mL premix        2 g 200 mL/hr over 30 Minutes Intravenous On call to O.R. 04/05/23 1855 04/07/23 0559          Medications  (feeding supplement) PROSource Plus  30 mL Oral TID BM   amiodarone  200 mg Oral Daily   aspirin  EC  81 mg Oral Daily   atorvastatin  80 mg Oral QHS   calcitRIOL  2.25 mcg Oral Q T,Th,Sat-1800   calcium acetate  1,334 mg Oral TID WC   carvedilol  12.5 mg Oral BID   cinacalcet  60 mg Oral Q T,Th,Sat-1800   darbepoetin (ARANESP) injection - DIALYSIS  150 mcg Subcutaneous Q Tue-1800   dorzolamide-timolol  1 drop Right Eye BID   heparin sodium (porcine)  1,500 Units Intravenous Once   leptospermum manuka honey  1 Application Topical Daily   levETIRAcetam  500 mg Oral Once per day on Sunday Monday Wednesday Friday   levETIRAcetam  750 mg Oral Once per day on Tuesday Thursday Saturday   lidocaine-prilocaine  1 Application Topical Once per day on Monday Wednesday Friday   multivitamin  1 tablet Oral QHS   mupirocin ointment  1 Application Nasal BID   nutrition supplement (JUVEN)  1 packet Oral BID BM   pneumococcal 20-valent conjugate vaccine  0.5 mL Intramuscular Tomorrow-1000   povidone-iodine  2 Application Topical Once   tranexamic acid (CYKLOKAPRON) 2,000 mg in sodium chloride 0.9 % 50 mL Topical Application  2,000 mg Topical To OR      Subjective:   [name] was seen and examined today.  BP uncontrolled, no acute complaints, awaiting for surgery today.  Patient denies dizziness, chest pain, shortness of breath,  abdominal pain, N/V/D/C, new weakness, numbess, tingling. No acute events overnight.    Objective:   Vitals:   04/06/23 0130 04/06/23 0204 04/06/23 0528 04/06/23 0834  BP: (!) 151/73 (!) 176/74 (!) 168/72 (!) 170/75  Pulse: (!) 58  (!) 58 (!) 59  Resp: (!) 9  18 18   Temp: 98.4 F (36.9 C) 97.9 F (36.6 C) 97.6 F (36.4 C)   TempSrc:  Oral Oral   SpO2: 100% 97% 98% 94%  Weight: 80 kg     Height:        Intake/Output Summary (Last 24 hours) at 04/06/2023 1214 Last data filed at 04/06/2023 0600 Gross per 24 hour  Intake 120 ml  Output 0 ml  Net 120 ml     Wt Readings from Last 3 Encounters:  04/06/23 80 kg  03/17/23 79.4 kg  02/28/23 80.4 kg     Exam General: Alert and oriented x 3, NAD Cardiovascular: S1 S2 auscultated,  RRR Respiratory: Clear to auscultation bilaterally, no wheezing Gastrointestinal: Soft, nontender, nondistended, + bowel sounds Ext: right BKA, no left edema Neuro: no new deficits Skin: Pressure injury on the left heel, right BKA stump gangrene, dressing intact Psych: Normal affect     Data Reviewed:  I have personally reviewed following labs    CBC Lab Results  Component Value Date   WBC 6.9 04/05/2023   RBC 3.51 (L) 04/05/2023   HGB 8.0 (L) 04/05/2023   HCT 26.5 (L) 04/05/2023   MCV 75.5 (L) 04/05/2023   MCH 22.8 (L) 04/05/2023   PLT 180 04/05/2023   MCHC 30.2 04/05/2023   RDW 20.5 (H) 04/05/2023   LYMPHSABS 1.4 04/05/2023   MONOABS 0.8 04/05/2023   EOSABS 0.7 (H) 04/05/2023   BASOSABS 0.1 04/05/2023     Last metabolic panel Lab Results  Component Value Date   NA 142 04/05/2023   K 5.2 (H) 04/05/2023   CL 103 04/05/2023   CO2 24 04/05/2023   BUN 139 (H) 04/05/2023   CREATININE 10.47 (H) 04/05/2023   GLUCOSE 75 04/05/2023   GFRNONAA 5 (L) 04/05/2023  GFRAA 5 (L) 08/27/2019   CALCIUM 9.0 04/05/2023   PHOS 6.9 (H) 04/05/2023   PROT 6.4 (L) 03/24/2023   ALBUMIN 2.5 (L) 04/05/2023   LABGLOB 2.9 08/19/2016   AGRATIO 0.9  08/19/2016   BILITOT 0.7 03/24/2023   ALKPHOS 62 03/24/2023   AST 13 (L) 03/24/2023   ALT 14 03/24/2023   ANIONGAP 15 04/05/2023    CBG (last 3)  No results for input(s): "GLUCAP" in the last 72 hours.    Coagulation Profile: No results for input(s): "INR", "PROTIME" in the last 168 hours.   Radiology Studies: I have personally reviewed the imaging studies  MR FOOT LEFT WO CONTRAST Result Date: 04/05/2023 CLINICAL DATA:  Osteomyelitis, foot dry gangrene EXAM: MRI OF THE LEFT FOOT WITHOUT CONTRAST TECHNIQUE: Multiplanar, multisequence MR imaging of the left forefoot was performed. No intravenous contrast was administered. COMPARISON:  None recent. Left foot radiographs 05/25/2022 and 06/11/2016. MRI of the left ankle 08/04/2016. FINDINGS: Bones/Joint/Cartilage Status post remote amputation of the great toe at the metatarsophalangeal joint. No significant abnormality of the 1st metatarsal head. 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 There are mild secondary degenerative changes. There is abnormal T2 hyperintensity within the 2nd proximal phalanx with the suggestion of a subacute fracture based on the short axis transverse axial images. No other significant marrow signal abnormalities identified in the forefoot. There is mild subchondral edema proximally in the navicular, likely reactive. Ligaments Intact Lisfranc ligament. The collateral ligaments of the remaining metatarsophalangeal joints appear intact. Muscles and Tendons Mild nonspecific generalized muscular atrophy and edema. There is a small amount of fluid surrounding the flexor digitorum longus tendons proximally in the forefoot. No significant distal tenosynovitis. Soft tissues Chronic linear metallic foreign body within the soft tissues lateral to the 5th metatarsal head with associated susceptibility artifact, unchanged from the most recent  radiographs. This limits evaluation of the surrounding soft tissues. No obvious skin ulceration or focal fluid collection identified. IMPRESSION: 1. 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 2. Abnormal T2 hyperintensity within the 2nd proximal phalanx with the suggestion of a subacute fracture. Recommend plain film correlation. 3. No findings highly suspicious for osteomyelitis in the forefoot. Recommend clinical and radiographic correlation. 4. Chronic linear metallic foreign body within the soft tissues lateral to the 5th metatarsal head with associated susceptibility artifact, unchanged from the most recent radiographs. This limits evaluation of the surrounding soft tissues. No obvious skin ulceration or focal fluid collection identified. 5. Mild nonspecific generalized muscular atrophy and edema. 6. Small amount of fluid surrounding the flexor digitorum longus tendons proximally in the forefoot, nonspecific. Electronically Signed   By: Carey Bullocks M.D.   On: 04/05/2023 08:39       Gurnoor Sloop M.D. [facility] 04/06/2023, 12:14 PM  Available via Epic secure chat 7am-7pm After 7 pm, please refer to night coverage provider listed on amion.

## 2023-04-06 NOTE — Interval H&P Note (Signed)
History and Physical Interval Note:  04/06/2023 6:40 AM  [name].  has presented today for surgery, with the diagnosis of Gangrene Right Below Knee Amputation.  The various methods of treatment have been discussed with the patient and family. After consideration of risks, benefits and other options for treatment, the patient has consented to  Procedure(s): RIGHT ABOVE KNEE AMPUTATION (Right) as a surgical intervention.  The patient's history has been reviewed, patient examined, no change in status, stable for surgery.  I have reviewed the patient's chart and labs.  Questions were answered to the patient's satisfaction.     [name]

## 2023-04-06 NOTE — Anesthesia Preprocedure Evaluation (Signed)
Anesthesia Evaluation  Patient identified by MRN, date of birth, ID band Patient awake    Reviewed: Allergy & Precautions, H&P , NPO status , Patient's Chart, lab work & pertinent test results  Airway Mallampati: II  TM Distance: >3 FB Neck ROM: Full    Dental no notable dental hx.    Pulmonary neg pulmonary ROS   Pulmonary exam normal breath sounds clear to auscultation       Cardiovascular hypertension, + CAD, + Past MI and + Peripheral Vascular Disease  negative cardio ROS Normal cardiovascular exam Rhythm:Regular Rate:Normal  Echo: 1. Left ventricular ejection fraction, by estimation, is 60 to 65%. The  left ventricle has normal function. The left ventricle has no regional  wall motion abnormalities. There is severe concentric left ventricular  hypertrophy.   2. Right ventricular systolic function is normal. The right ventricular  size is normal. Tricuspid regurgitation signal is inadequate for assessing  PA pressure.   3. The mitral valve is normal in structure. No evidence of mitral valve  regurgitation. No evidence of mitral stenosis. Severe mitral annular  calcification.   4. The aortic valve is normal in structure. Aortic valve regurgitation is  not visualized. Aortic valve sclerosis/calcification is present, without  any evidence of aortic stenosis.   5. The inferior vena cava is normal in size with greater than 50%  respiratory variability, suggesting right atrial pressure of 3 mmHg.   6. There is a moderate circumferential pericardial with greatest diameter  measuring 2.84cm. There is RA inversion but no RV diastolic collapse.  There is no significant respirophasic changes in MV inflow velocities. THe  IVC is not dilated. There is no  evidence of cardiac tamponade.   7. Compared to study dated 02/26/20, the pericardial effusion is slightly  larger. Also, with severe LVH would consider PYP scan to rule out   amyloidosis.     Neuro/Psych Seizures -,   Anxiety     CVA negative neurological ROS  negative psych ROS   GI/Hepatic negative GI ROS, Neg liver ROS,GERD  ,,  Endo/Other  negative endocrine ROSdiabetes    Renal/GU ESRF and DialysisRenal diseasenegative Renal ROS  negative genitourinary   Musculoskeletal negative musculoskeletal ROS (+) Arthritis ,    Abdominal   Peds negative pediatric ROS (+)  Hematology negative hematology ROS (+) Blood dyscrasia, anemia   Anesthesia Other Findings   Reproductive/Obstetrics negative OB ROS                             Anesthesia Physical Anesthesia Plan  ASA: 3  Anesthesia Plan: General   Post-op Pain Management:    Induction: Intravenous  PONV Risk Score and Plan: 3 and Ondansetron, Dexamethasone, Midazolam and Treatment may vary due to age or medical condition  Airway Management Planned: LMA  Additional Equipment: None  Intra-op Plan:   Post-operative Plan: Extubation in OR  Informed Consent:   Plan Discussed with: CRNA  Anesthesia Plan Comments:        Anesthesia Quick Evaluation

## 2023-04-06 NOTE — Progress Notes (Signed)
[facility] Progress Note   Subjective:    Seen and examined patient at bedside. Tolerated yesterday's HD yesterday. Plan for R BKA later this afternoon. Next HD 2/13.  Objective Vitals:   04/06/23 0130 04/06/23 0204 04/06/23 0528 04/06/23 0834  BP: (!) 151/73 (!) 176/74 (!) 168/72 (!) 170/75  Pulse: (!) 58  (!) 58 (!) 59  Resp: (!) 9  18 18   Temp: 98.4 F (36.9 C) 97.9 F (36.6 C) 97.6 F (36.4 C)   TempSrc:  Oral Oral   SpO2: 100% 97% 98% 94%  Weight: 80 kg     Height:       Physical Exam General: Alert chronically ill male NAD Heart: RRR no MRG Lungs: CTA bilaterally nonlabored breathing Abdomen: Soft and non-tender Extremities: No pedal edema, R BKA dressing dry clear, no pedal edema Dialysis Access:  LU A  AVG + bruit  Filed Weights   04/05/23 2003 04/05/23 2243 04/06/23 0130  Weight: 79.7 kg 79.7 kg 80 kg    Intake/Output Summary (Last 24 hours) at 04/06/2023 1323 Last data filed at 04/06/2023 0600 Gross per 24 hour  Intake 120 ml  Output 0 ml  Net 120 ml    Additional Objective Labs: Basic Metabolic Panel: Recent Labs  Lab 03/31/23 1055 04/01/23 0924 04/03/23 0151 04/04/23 0356 04/04/23 1134 04/05/23 0352  NA 139   < > 137 139  --  142  K 6.8*   < > 4.4 5.2* 4.9 5.2*  CL 103   < > 99 96*  --  103  CO2 24   < > 27 25  --  24  GLUCOSE 89   < > 106* 96  --  75  BUN 62*   < > 68* 115*  --  139*  CREATININE 9.02*   < > 6.56* 9.32*  --  10.47*  CALCIUM 8.9   < > 8.6* 9.1  --  9.0  PHOS 5.2*  --   --  6.6*  --  6.9*   < > = values in this interval not displayed.   Liver Function Tests: Recent Labs  Lab 03/31/23 1055 04/04/23 0356 04/05/23 0352  ALBUMIN 2.7* 2.5* 2.5*   No results for input(s): "LIPASE", "AMYLASE" in the last 168 hours. CBC: Recent Labs  Lab 03/31/23 1056 04/02/23 1438 04/03/23 0955 04/04/23 0356 04/05/23 0352  WBC 6.5 7.8 6.6 7.8 6.9  NEUTROABS  --  4.5 3.4  --  3.9  HGB 7.5* 7.8* 7.1* 8.3* 8.0*  HCT  25.4* 26.5* 24.4* 27.6* 26.5*  MCV 75.6* 75.5* 74.8* 75.6* 75.5*  PLT 183 187 179 187 180   Blood Culture    Component Value Date/Time   SDES TISSUE 02/04/2023 1206   SDES WOUND 02/04/2023 1206   SPECREQUEST RIGHT FOOT 02/04/2023 1206   SPECREQUEST RIGHT FOOT 02/04/2023 1206   CULT  02/04/2023 1206    ABUNDANT STAPHYLOCOCCUS AUREUS MODERATE CORYNEBACTERIUM STRIATUM Standardized susceptibility testing for this organism is not available. NO ANAEROBES ISOLATED Performed at [facility], [address], [place], [place] [address]    CULT  02/04/2023 1206    ABUNDANT STAPHYLOCOCCUS AUREUS SUSCEPTIBILITIES PERFORMED ON PREVIOUS CULTURE WITHIN THE LAST 5 DAYS. MODERATE CORYNEBACTERIUM STRIATUM Standardized susceptibility testing for this organism is not available. NO ANAEROBES ISOLATED Performed at [facility], [address], [place], [place] [address]    REPTSTATUS 02/09/2023 FINAL 02/04/2023 1206   REPTSTATUS 02/09/2023 FINAL 02/04/2023 1206    Cardiac Enzymes:  No results for input(s): "CKTOTAL", "CKMB", "CKMBINDEX", "TROPONINI" in the last 168 hours. CBG: Recent Labs  Lab 03/31/23 1440 04/03/23 0731  GLUCAP 86 75   Iron Studies: No results for input(s): "IRON", "TIBC", "TRANSFERRIN", "FERRITIN" in the last 72 hours. Lab Results  Component Value Date   INR 1.1 03/01/2022   INR 1.6 (H) 11/21/2021   INR 1.3 (H) 08/12/2021   Studies/Results: MR FOOT LEFT WO CONTRAST Result Date: 04/05/2023 CLINICAL DATA:  Osteomyelitis, foot dry gangrene EXAM: MRI OF THE LEFT FOOT WITHOUT CONTRAST TECHNIQUE: Multiplanar, multisequence MR imaging of the left forefoot was performed. No intravenous contrast was administered. COMPARISON:  None recent. Left foot radiographs 05/25/2022 and 06/11/2016. MRI of the left ankle 08/04/2016. FINDINGS: Bones/Joint/Cartilage Status post remote amputation of the great toe at the metatarsophalangeal joint. No significant abnormality of the 1st  metatarsal head. 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 There are mild secondary degenerative changes. There is abnormal T2 hyperintensity within the 2nd proximal phalanx with the suggestion of a subacute fracture based on the short axis transverse axial images. No other significant marrow signal abnormalities identified in the forefoot. There is mild subchondral edema proximally in the navicular, likely reactive. Ligaments Intact Lisfranc ligament. The collateral ligaments of the remaining metatarsophalangeal joints appear intact. Muscles and Tendons Mild nonspecific generalized muscular atrophy and edema. There is a small amount of fluid surrounding the flexor digitorum longus tendons proximally in the forefoot. No significant distal tenosynovitis. Soft tissues Chronic linear metallic foreign body within the soft tissues lateral to the 5th metatarsal head with associated susceptibility artifact, unchanged from the most recent radiographs. This limits evaluation of the surrounding soft tissues. No obvious skin ulceration or focal fluid collection identified. IMPRESSION: 1. 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 2. Abnormal T2 hyperintensity within the 2nd proximal phalanx with the suggestion of a subacute fracture. Recommend plain film correlation. 3. No findings highly suspicious for osteomyelitis in the forefoot. Recommend clinical and radiographic correlation. 4. Chronic linear metallic foreign body within the soft tissues lateral to the 5th metatarsal head with associated susceptibility artifact, unchanged from the most recent radiographs. This limits evaluation of the surrounding soft tissues. No obvious skin ulceration or focal fluid collection identified. 5. Mild nonspecific generalized muscular atrophy and edema. 6. Small amount  of fluid surrounding the flexor digitorum longus tendons proximally in the forefoot, nonspecific. Electronically Signed   By: Carey Bullocks M.D.   On: 04/05/2023 08:39    Medications:   ceFAZolin (ANCEF) IV     tranexamic acid      (feeding supplement) PROSource Plus  30 mL Oral TID BM   amiodarone  200 mg Oral Daily   aspirin EC  81 mg Oral Daily   atorvastatin  80 mg Oral QHS   calcitRIOL  2.25 mcg Oral Q T,Th,Sat-1800   calcium acetate  1,334 mg Oral TID WC   carvedilol  12.5 mg Oral BID   cinacalcet  60 mg Oral Q T,Th,Sat-1800   darbepoetin (ARANESP) injection - DIALYSIS  150 mcg Subcutaneous Q Tue-1800   dorzolamide-timolol  1 drop Right Eye BID   heparin sodium (porcine)  1,500 Units Intravenous Once   leptospermum manuka honey  1 Application Topical Daily   levETIRAcetam  500 mg Oral Once per day on Sunday Monday Wednesday Friday   levETIRAcetam  750 mg Oral Once  per day on Tuesday Thursday Saturday   lidocaine-prilocaine  1 Application Topical Once per day on Monday Wednesday Friday   multivitamin  1 tablet Oral QHS   mupirocin ointment  1 Application Nasal BID   nutrition supplement (JUVEN)  1 packet Oral BID BM   pneumococcal 20-valent conjugate vaccine  0.5 mL Intramuscular Tomorrow-1000   povidone-iodine  2 Application Topical Once   tranexamic acid (CYKLOKAPRON) 2,000 mg in sodium chloride 0.9 % 50 mL Topical Application  2,000 mg Topical To OR    Dialysis Orders: [facility] TTS 3h  72kg  B400  LUA AVF  Heparin 1000+ 400 u/hr  Assessment/Plan: Right BKA with painful gangrenous wound: per [name] plans for right BKA later this afternoon. Antibiotics per admit team/[name] ESRD -HD TTS. Next 2/13.Marland Kitchen Anticipate his EDW will need to be adjusted after his procedure. General Weakness/debility: secondary #1 patient family unable to care for patient SNF recommended Hyperkalemia: K+ 5.2. He's upset with the renal diet and currently on carb mod /renal diet. He  admits to eating outside foods. I advised following the proper diet and limiting outside foods to keep his K+ under control. Follow trend closely. May need to consider Lokelma but will hold off for now. Awaiting labs to be drawn. HTN/volume: no excess volume on exam . BP okay. On midodrine 5 mg 3 times daily.  Anemia: Hgb 7.8. Not reported outpatient ESA. Given Aranesp 150 mcg 02/04  q Tues. subcu while here Secondary hyperparathyroidism: corrected calcium okay, phosphorus 5.2, on calcitriol, Sensipar, and calcium acetate as binder Atrial fibrillation: on amiodarone and Coreg DM T2= per admit  Salome Holmes, NP [facility] 04/06/2023,1:23 PM  LOS: 2 days

## 2023-04-06 NOTE — Progress Notes (Signed)
   04/06/23 0130  Vitals  Temp 98.4 F (36.9 C)  Pulse Rate (!) 58  Resp (!) 9  BP (!) 151/73  SpO2 100 %  O2 Device Room Air  Weight 80 kg  Type of Weight Post-Dialysis  Oxygen Therapy  Patient Activity (if Appropriate) In bed  Pulse Oximetry Type Continuous  Oximetry Probe Site Changed No  Post Treatment  Dialyzer Clearance Lightly streaked  Hemodialysis Intake (mL) 0 mL  Liters Processed 47.5  Fluid Removed (mL) 0 mL  Tolerated HD Treatment No (Comment)  Post-Hemodialysis Comments pt signed off ama  AVG/AVF Arterial Site Held (minutes) 10 minutes  AVG/AVF Venous Site Held (minutes) 10 minutes   Received patient in bed to unit.  Alert and oriented.  Informed consent signed and in chart.   TX duration:2.0 hours--pt signed off ama--dr foster notified  Patient tolerated well.  Transported back to the room  Alert, without acute distress.  Hand-off given to patient's nurse.   Access used: LUAF Access issues: 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s  Total UF removed: ran even per order Medication(s) given: none   [facility]

## 2023-04-06 NOTE — TOC Progression Note (Addendum)
Transition of Care (TOC) - Progression Note    Patient Details  Name: [name]. MRN: [id number] Date of Birth: Jan 27, 1971  Transition of Care [facility]) CM/SW Contact  Delilah Shan, LCSWA Phone Number: 04/06/2023, 5:20 PM  Clinical Narrative:     CSW plans to follow up with patient on disposition/dc plans after procedure. TOC will continue to follow.    Barriers to Discharge: Insurance Authorization  Expected Discharge Plan and Services                                               Social Determinants of Health (SDOH) Interventions SDOH Screenings   Food Insecurity: Patient Declined (03/31/2023)  Housing: Patient Declined (03/31/2023)  Transportation Needs: Patient Declined (03/31/2023)  Utilities: Patient Declined (03/31/2023)  Financial Resource Strain: High Risk (07/05/2017)  Physical Activity: Inactive (07/05/2017)  Social Connections: Unknown (03/31/2023)  Stress: Stress Concern Present (07/05/2017)  Tobacco Use: Low Risk  (04/06/2023)  Health Literacy: Low Risk  (06/01/2020)   [facility] from [facility]    Readmission Risk Interventions    07/27/2021   11:49 AM 11/27/2020    3:06 PM  Readmission Risk Prevention Plan  Transportation Screening Complete Complete  PCP or Specialist Appt within 3-5 Days  Complete  HRI or Home Care Consult  Complete  Social Work Consult for Recovery Care Planning/Counseling  Complete  Palliative Care Screening  Not Applicable  Medication Review Oceanographer) Complete Complete  SW Recovery Care/Counseling Consult Complete   Palliative Care Screening Not Applicable   Skilled Nursing Facility Not Applicable

## 2023-04-06 NOTE — Progress Notes (Signed)
Patient took telemetry leads off and refusing it back on. " I a tired"

## 2023-04-06 NOTE — Transfer of Care (Signed)
Immediate Anesthesia Transfer of Care Note  Patient: [name].  Procedure(s) Performed: RIGHT ABOVE KNEE AMPUTATION (Right: Knee)  Patient Location: PACU  Anesthesia Type:General  Level of Consciousness: sedated  Airway & Oxygen Therapy: Patient Spontanous Breathing and Patient connected to face mask oxygen  Post-op Assessment: Report given to RN  Post vital signs: stable  Last Vitals:  Vitals Value Taken Time  BP 131/64 04/06/23 1630  Temp    Pulse 54 04/06/23 1632  Resp 5 04/06/23 1632  SpO2 94 % 04/06/23 1632  Vitals shown include unfiled device data.  Last Pain:  Vitals:   04/06/23 1418  TempSrc:   PainSc: 0-No pain      Patients Stated Pain Goal: 2 (04/05/23 0234)  Complications: No notable events documented.

## 2023-04-06 NOTE — Op Note (Signed)
04/06/2023  4:34 PM  PATIENT:  [name].    PRE-OPERATIVE DIAGNOSIS:  Gangrene Right Below Knee Amputation  POST-OPERATIVE DIAGNOSIS:  Same  PROCEDURE:  RIGHT ABOVE KNEE AMPUTATION Application of Prevena customizable wound VAC.  SURGEON:  [name], MD  PHYSICIAN ASSISTANT:None ANESTHESIA:   General  PREOPERATIVE INDICATIONS:  [name]. is a  53 y.o. male with a diagnosis of Gangrene Right Below Knee Amputation who failed conservative measures and elected for surgical management.    The risks benefits and alternatives were discussed with the patient preoperatively including but not limited to the risks of infection, bleeding, nerve injury, cardiopulmonary complications, the need for revision surgery, among others, and the patient was willing to proceed.  OPERATIVE IMPLANTS:   * No implants in log *  @ENCIMAGES @  OPERATIVE FINDINGS: Patient had extremely calcified vessels.  Muscle had good color and contractility.  OPERATIVE PROCEDURE: Patient brought the operating room and underwent a general anesthetic.  After adequate levels anesthesia obtained patient's right lower extremity was prepped using DuraPrep draped into a sterile field a timeout was called.  The necrotic transtibial amputation was draped out of the sterile field with impervious stockinette.  A fishmouth incision was made just proximal to the patella.  This was carried down extra-articular lead to the femur.  The dissection was carried down to the intermuscular septum medially the vascular bundles were clamped and suture-ligated with 2-0 silk.  The remainder of the amputation was completed the sciatic nerve was pulled cut and allowed to retract.  Electrocautery was used hemostasis.  The femur was transected with a reciprocating saw.  After hemostasis was obtained the deep and superficial fascial layers were closed using #1 Vicryl the skin was closed using staples a Prevena customizable wound VAC was  applied this had a good suction fit patient was extubated taken the PACU in stable condition.   DISCHARGE PLANNING:  Antibiotic duration: Preoperative antibiotics.  Tissue margins clear  Weightbearing: Weightbearing as tolerated on the left  Pain medication: Continue current pain regimen  Dressing care/ Wound VAC: Wound VAC for 1 week  Ambulatory devices: Anticipate transfer training  Discharge to: Anticipate discharge to skilled nursing.  Follow-up: In the office 1 week post operative.

## 2023-04-07 ENCOUNTER — Encounter (HOSPITAL_COMMUNITY): Payer: Self-pay | Admitting: Orthopedic Surgery

## 2023-04-07 ENCOUNTER — Other Ambulatory Visit: Payer: Self-pay

## 2023-04-07 DIAGNOSIS — D638 Anemia in other chronic diseases classified elsewhere: Secondary | ICD-10-CM

## 2023-04-07 DIAGNOSIS — R5381 Other malaise: Secondary | ICD-10-CM | POA: Diagnosis not present

## 2023-04-07 DIAGNOSIS — H538 Other visual disturbances: Secondary | ICD-10-CM

## 2023-04-07 DIAGNOSIS — R531 Weakness: Secondary | ICD-10-CM | POA: Diagnosis not present

## 2023-04-07 LAB — BASIC METABOLIC PANEL
Anion gap: 18 — ABNORMAL HIGH (ref 5–15)
BUN: 123 mg/dL — ABNORMAL HIGH (ref 6–20)
CO2: 21 mmol/L — ABNORMAL LOW (ref 22–32)
Calcium: 8.5 mg/dL — ABNORMAL LOW (ref 8.9–10.3)
Chloride: 96 mmol/L — ABNORMAL LOW (ref 98–111)
Creatinine, Ser: 10.62 mg/dL — ABNORMAL HIGH (ref 0.61–1.24)
GFR, Estimated: 5 mL/min — ABNORMAL LOW (ref >60)
Glucose, Bld: 109 mg/dL — ABNORMAL HIGH (ref 70–99)
Potassium: 4.6 mmol/L (ref 3.5–5.1)
Sodium: 135 mmol/L (ref 135–145)

## 2023-04-07 LAB — CBC
HCT: 26.1 % — ABNORMAL LOW (ref 39.0–52.0)
Hemoglobin: 7.9 g/dL — ABNORMAL LOW (ref 13.0–17.0)
MCH: 22.7 pg — ABNORMAL LOW (ref 26.0–34.0)
MCHC: 30.3 g/dL (ref 30.0–36.0)
MCV: 75 fL — ABNORMAL LOW (ref 80.0–100.0)
Platelets: 172 10*3/uL (ref 150–400)
RBC: 3.48 MIL/uL — ABNORMAL LOW (ref 4.22–5.81)
RDW: 21.1 % — ABNORMAL HIGH (ref 11.5–15.5)
WBC: 8.8 10*3/uL (ref 4.0–10.5)
nRBC: 0 % (ref 0.0–0.2)

## 2023-04-07 MED ORDER — HYDROMORPHONE HCL 1 MG/ML IJ SOLN
1.0000 mg | INTRAMUSCULAR | Status: DC | PRN
  Administered 2023-04-07 – 2023-04-09 (×5): 1 mg via INTRAVENOUS
  Filled 2023-04-07 (×6): qty 1

## 2023-04-07 MED ORDER — DARBEPOETIN ALFA 200 MCG/0.4ML IJ SOSY
200.0000 ug | PREFILLED_SYRINGE | INTRAMUSCULAR | Status: DC
  Administered 2023-04-12: 200 ug via SUBCUTANEOUS
  Filled 2023-04-07: qty 0.4

## 2023-04-07 MED ORDER — PROCHLORPERAZINE EDISYLATE 10 MG/2ML IJ SOLN
2.5000 mg | Freq: Once | INTRAMUSCULAR | Status: AC
  Administered 2023-04-08: 2.5 mg via INTRAVENOUS
  Filled 2023-04-07: qty 2

## 2023-04-07 MED ORDER — CAMPHOR-MENTHOL 0.5-0.5 % EX LOTN
TOPICAL_LOTION | CUTANEOUS | Status: DC | PRN
  Filled 2023-04-07: qty 222

## 2023-04-07 MED ORDER — GERHARDT'S BUTT CREAM
TOPICAL_CREAM | Freq: Four times a day (QID) | CUTANEOUS | Status: AC | PRN
Start: 2023-04-07 — End: ?
  Filled 2023-04-07: qty 60

## 2023-04-07 MED ORDER — BISACODYL 10 MG RE SUPP
10.0000 mg | Freq: Once | RECTAL | Status: DC
  Filled 2023-04-07: qty 1

## 2023-04-07 MED ORDER — FENTANYL CITRATE PF 50 MCG/ML IJ SOSY
25.0000 ug | PREFILLED_SYRINGE | Freq: Once | INTRAMUSCULAR | Status: AC
  Administered 2023-04-07: 25 ug via INTRAVENOUS
  Filled 2023-04-07: qty 1

## 2023-04-07 NOTE — Progress Notes (Signed)
Inpatient Rehab Coordinator Note:  I met with patient and spouse at bedside to discuss CIR recommendations and goals/expectations of CIR stay.  We reviewed 3 hrs/day of therapy, physician follow up, and average length of stay 2 weeks (dependent upon progress) with goals of supervision to min assist w/c level.  They are in agreement to pursue CIR admission and spouse confirms she can provide some physical assist at discharge if needed.  The house is w/c accessible except for the bathroom per their report.  Somewhat unclear timeline of events, but appears that 6 months ago pt was independent and needed no assist and has had recent decline over the last 6-8 weeks to where they were seeking long term care.  Will ask rehab MD to see.   1501: Dr. Berline Chough saw pt/spouse at bedside.  Per their report to her, spouse is unable to provide any physical assist at discharge and she is unable to take time off from work to provide 24/7 supervision.  Based on current level of function he will almost certainly need 24/7 physical assist after a short rehab stay, and if this is not available I would recommend SNF.   Estill Dooms, PT, DPT Admissions Coordinator [phone] 04/07/23  3:03 PM

## 2023-04-07 NOTE — Progress Notes (Signed)
[facility] Progress Note   Subjective:    Seen and examined patient at bedside. S/p R BKA by [name] yesterday. Wound vac in place. He reports tolerating the procedure well. K+ now stable. Plan for HD this afternoon.  Objective Vitals:   04/07/23 0240 04/07/23 0337 04/07/23 0452 04/07/23 0800  BP: 138/77 (!) 140/63 (!) 171/70 122/63  Pulse: (!) 55 (!) 54 (!) 57 60  Resp:   18   Temp:   97.9 F (36.6 C) 98.1 F (36.7 C)  TempSrc:    Oral  SpO2: 96%  97% 97%  Weight:      Height:       Physical Exam General: Alert chronically ill male NAD Heart: RRR no MRG Lungs: CTA bilaterally nonlabored breathing Abdomen: Soft and non-tender Extremities: No pedal edema, R BKA-wound vac in place Dialysis Access:  LU A  AVG + bruit  Filed Weights   04/05/23 2003 04/05/23 2243 04/06/23 0130  Weight: 79.7 kg 79.7 kg 80 kg    Intake/Output Summary (Last 24 hours) at 04/07/2023 1339 Last data filed at 04/07/2023 0700 Gross per 24 hour  Intake 200 ml  Output 20 ml  Net 180 ml    Additional Objective Labs: Basic Metabolic Panel: Recent Labs  Lab 04/04/23 0356 04/04/23 1134 04/05/23 0352 04/06/23 1433 04/07/23 1159  NA 139  --  142 134* 135  K 5.2*   < > 5.2* 4.5 4.6  CL 96*  --  103 97* 96*  CO2 25  --  24  --  21*  GLUCOSE 96  --  75 80 109*  BUN 115*  --  139* 105* 123*  CREATININE 9.32*  --  10.47* 10.10* 10.62*  CALCIUM 9.1  --  9.0  --  8.5*  PHOS 6.6*  --  6.9*  --   --    < > = values in this interval not displayed.   Liver Function Tests: Recent Labs  Lab 04/04/23 0356 04/05/23 0352  ALBUMIN 2.5* 2.5*   No results for input(s): "LIPASE", "AMYLASE" in the last 168 hours. CBC: Recent Labs  Lab 04/02/23 1438 04/03/23 0955 04/04/23 0356 04/05/23 0352 04/06/23 1238 04/06/23 1433 04/07/23 1159  WBC 7.8 6.6 7.8 6.9 8.1  --  8.8  NEUTROABS 4.5 3.4  --  3.9  --   --   --   HGB 7.8* 7.1* 8.3* 8.0* 7.8* 8.8* 7.9*  HCT 26.5* 24.4* 27.6* 26.5*  25.5* 26.0* 26.1*  MCV 75.5* 74.8* 75.6* 75.5* 74.6*  --  75.0*  PLT 187 179 187 180 182  --  172   Blood Culture    Component Value Date/Time   SDES TISSUE 02/04/2023 1206   SDES WOUND 02/04/2023 1206   SPECREQUEST RIGHT FOOT 02/04/2023 1206   SPECREQUEST RIGHT FOOT 02/04/2023 1206   CULT  02/04/2023 1206    ABUNDANT STAPHYLOCOCCUS AUREUS MODERATE CORYNEBACTERIUM STRIATUM Standardized susceptibility testing for this organism is not available. NO ANAEROBES ISOLATED Performed at [facility], [address] [place], [place] [address]    CULT  02/04/2023 1206    ABUNDANT STAPHYLOCOCCUS AUREUS SUSCEPTIBILITIES PERFORMED ON PREVIOUS CULTURE WITHIN THE LAST 5 DAYS. MODERATE CORYNEBACTERIUM STRIATUM Standardized susceptibility testing for this organism is not available. NO ANAEROBES ISOLATED Performed at [facility], [address], [place], [place] [address]    REPTSTATUS 02/09/2023 FINAL 02/04/2023 1206   REPTSTATUS 02/09/2023 FINAL 02/04/2023 1206    Cardiac Enzymes: No results for input(s): "  CKTOTAL", "CKMB", "CKMBINDEX", "TROPONINI" in the last 168 hours. CBG: Recent Labs  Lab 03/31/23 1440 04/03/23 0731 04/06/23 1630  GLUCAP 86 75 75   Iron Studies: No results for input(s): "IRON", "TIBC", "TRANSFERRIN", "FERRITIN" in the last 72 hours. Lab Results  Component Value Date   INR 1.1 03/01/2022   INR 1.6 (H) 11/21/2021   INR 1.3 (H) 08/12/2021   Studies/Results: No results found.  Medications:  sodium chloride      (feeding supplement) PROSource Plus  30 mL Oral TID BM   amiodarone  200 mg Oral Daily   vitamin C  1,000 mg Oral Daily   aspirin EC  81 mg Oral Daily   atorvastatin  80 mg Oral QHS   calcitRIOL  2.25 mcg Oral Q T,Th,Sat-1800   calcium acetate  1,334 mg Oral TID WC   carvedilol  12.5 mg Oral BID   Chlorhexidine Gluconate Cloth  6 each Topical Q0600   cinacalcet  60 mg Oral Q T,Th,Sat-1800   darbepoetin (ARANESP) injection - DIALYSIS   150 mcg Subcutaneous Q Tue-1800   dorzolamide-timolol  1 drop Right Eye BID   heparin sodium (porcine)  1,500 Units Intravenous Once   leptospermum manuka honey  1 Application Topical Daily   levETIRAcetam  500 mg Oral Once per day on Sunday Monday Wednesday Friday   levETIRAcetam  750 mg Oral Once per day on Tuesday Thursday Saturday   lidocaine-prilocaine  1 Application Topical Once per day on Monday Wednesday Friday   multivitamin  1 tablet Oral QHS   nutrition supplement (JUVEN)  1 packet Oral BID BM   pneumococcal 20-valent conjugate vaccine  0.5 mL Intramuscular Tomorrow-1000   zinc sulfate (50mg  elemental zinc)  220 mg Oral Daily    Dialysis Orders: [facility] TTS 3h  72kg  B400  LUA AVF  Heparin 1000+ 400 u/hr  Assessment/Plan: Right BKA with painful gangrenous wound: S/p right BKA yesterday by [name]. Antibiotics per admit team/[name] ESRD -HD TTS. Next HD this afternoon.. Anticipate his EDW will need to be adjusted after his procedure. General Weakness/debility: secondary #1 patient family unable to care for patient SNF recommended Hyperkalemia: . I advised following the proper diet and limiting outside foods to keep his K+ under control. Follow trend closely. May need to consider Lokelma but will hold off for now. Awaiting labs to be drawn. HTN/volume: no excess volume on exam . BP okay. On midodrine 5 mg 3 times daily.  Anemia: Hgb 7.9. Not reported outpatient ESA. Will raise Aranesp to weekly. Transfuse for Hgb < 7 if needed given recent procedure. Secondary hyperparathyroidism: corrected calcium okay, phosphorus 5.2, on calcitriol, Sensipar, and calcium acetate as binder Atrial fibrillation: on amiodarone and Coreg DM T2= per admit    Salome Holmes, NP [facility] 04/07/2023,1:39 PM  LOS: 3 days

## 2023-04-07 NOTE — Progress Notes (Signed)
[facility]                                                                              [name], is a 53 y.o. male, DOB - [birth year], UUV:[id number] Admit date - 03/24/2023    Outpatient Primary MD for the patient is Tomasita Crumble T, FNP  LOS - 3  days  Chief Complaint  Patient presents with   Weakness       Brief summary   Patient is a 53 y.o. male with ESRD on HD TTS, recent right BKA ([name]), CAD, CVA, paroxysmal atrial fibrillation not on anticoagulation due to history of ICH, gastroparesis, DM type II, diabetic retinopathy, glaucoma, history of left eye enucleation who presented to [name], ED on 1/30 via EMS from dialysis unit with complaints of increased blurriness, weakness, cough and difficulty managing at home.  Patient states symptoms worse with dialysis.  Recently discharged from the hospital to SNF for 3 weeks and has been home over the last 10 days.  Patient reports is unable to take care of himself due to weakness and spouse working all the time.  Requesting placement.  MRA showed no intracranial large vessel occlusion or significant stenosis.  [name] discussed with ophthalmology on-call, [name] regarding his blurred vision in the setting of known diabetic retinopathy, glaucoma and history of left eye enucleation; ophthalmology recommended no other workup at this time and vision to be monitored and outpatient follow-up with his retina specialist.   [name] consulted for admission for further evaluation management of progressive weakness with TOC involvement for placement.  PT recommended SNF however insurance denied SNF authorization he is currently appealing the denial. He was transferred to [facility] for evaluation of the right BKA gangrene.  Plan for AKA on Wednesday   Assessment & Plan      Generalized weakness/deconditioning and gait disturbances -With recent prolonged hospitalization followed by subacute rehab after right BKA by [name] on  02/09/2023.   -Therapy evaluations recommended SNF however was denied by the insurance, patient is now appealing the denial.  Unable to care for himself at home, was briefly at home for 10 days after discharge from SNF.     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.  Recently seen in outpatient office visit 03/22/2023. -Dressing changes and wound care were recommended -Status post right above-knee amputation with application of Prevena wound VAC on 04/06/2023 by [name] -Complaining of pain     Paroxysmal atrial fibrillation -Continue rate control with amiodarone, Coreg -Not on anticoagulation due to history of ICH    Diabetes mellitus type 2, with end-stage renal disease, peripheral vascular disease -CBGs currently controlled -Not on insulin   Essential hypertension -Continue Coreg, hydralazine IV as needed with parameters   Seizure disorder -Continue with Keppra 500 mg on Sunday Tuesday Thursday and Saturday and 750 mg daily on Monday Wednesday and Friday -No new seizures since admission.     Anemia of chronic disease -Hemoglobin 7.9 -Continue to check CBC, will likely need packed effusive  transfusion for hemoglobin less than 7.5     ESRD on dialysis on TTS -Nephrology consulted, undergoing HD  -Receives outpatient dialysis at [facility]      Hyperkalemia -Stable     Autonomic dysfunction Continue midodrine    Pressure injury documentation Right thigh unstageable, POA Left heel, lateral, stage II, POA Left heel medial, deep tissue injury, POA -Wound care per nursing     Estimated body mass index is 22.04 kg/m as calculated from the following:   Height as of this encounter: 6\' 3"  (1.905 m).   Weight as of this encounter: 80 kg.  Code Status: Full code DVT Prophylaxis:  SCD's Start: 04/06/23 1738 SCDs Start: 03/24/23 2107   Level of Care:  Level of care: Med-Surg Family Communication: Updated patient's wife at the bedside Disposition Plan:      Remains inpatient appropriate: Patient being screened for CIR   Procedures:    Consultants:   Orthopedics  Antimicrobials:   Anti-infectives (From admission, onward)    Start     Dose/Rate Route Frequency Ordered Stop   04/06/23 0600  ceFAZolin (ANCEF) IVPB 2g/100 mL premix        2 g 200 mL/hr over 30 Minutes Intravenous On call to O.R. 04/05/23 1855 04/06/23 1547          Medications  (feeding supplement) PROSource Plus  30 mL Oral TID BM   amiodarone  200 mg Oral Daily   vitamin C  1,000 mg Oral Daily   aspirin EC  81 mg Oral Daily   atorvastatin  80 mg Oral QHS   calcitRIOL  2.25 mcg Oral Q T,Th,Sat-1800   calcium acetate  1,334 mg Oral TID WC   carvedilol  12.5 mg Oral BID   Chlorhexidine Gluconate Cloth  6 each Topical Q0600   cinacalcet  60 mg Oral Q T,Th,Sat-1800   darbepoetin (ARANESP) injection - DIALYSIS  150 mcg Subcutaneous Q Tue-1800   dorzolamide-timolol  1 drop Right Eye BID   heparin sodium (porcine)  1,500 Units Intravenous Once   leptospermum manuka honey  1 Application Topical Daily   levETIRAcetam  500 mg Oral Once per day on Sunday Monday Wednesday Friday   levETIRAcetam  750 mg Oral Once per day on Tuesday Thursday Saturday   lidocaine-prilocaine  1 Application Topical Once per day on Monday Wednesday Friday   multivitamin  1 tablet Oral QHS   nutrition supplement (JUVEN)  1 packet Oral BID BM   pneumococcal 20-valent conjugate vaccine  0.5 mL Intramuscular Tomorrow-1000   zinc sulfate (50mg  elemental zinc)  220 mg Oral Daily      Subjective:   [name] was seen and examined today.  States had significant pain in his right AKA stump.  Wife at the bedside.  No nausea vomiting, chest pain or shortness of breath, fevers.    Objective:   Vitals:   04/07/23 0240 04/07/23 0337 04/07/23 0452 04/07/23 0800  BP: 138/77 (!) 140/63  (!) 171/70 122/63  Pulse: (!) 55 (!) 54 (!) 57 60  Resp:   18   Temp:   97.9 F (36.6 C) 98.1 F (36.7 C)  TempSrc:    Oral  SpO2: 96%  97% 97%  Weight:      Height:        Intake/Output Summary (Last 24 hours) at 04/07/2023 1316 Last data filed at 04/07/2023 0700 Gross per 24 hour  Intake 200 ml  Output 20 ml  Net 180 ml  Wt Readings from Last 3 Encounters:  04/06/23 80 kg  03/17/23 79.4 kg  02/28/23 80.4 kg   Physical Exam General: Alert and oriented x 3, NAD Cardiovascular: S1 S2 clear, RRR.  Respiratory: CTAB Gastrointestinal: Soft, nontender, nondistended, NBS Ext: right AKA Neuro: no new deficits Psych: Normal affect       Data Reviewed:  I have personally reviewed following labs    CBC Lab Results  Component Value Date   WBC 8.8 04/07/2023   RBC 3.48 (L) 04/07/2023   HGB 7.9 (L) 04/07/2023   HCT 26.1 (L) 04/07/2023   MCV 75.0 (L) 04/07/2023   MCH 22.7 (L) 04/07/2023   PLT 172 04/07/2023   MCHC 30.3 04/07/2023   RDW 21.1 (H) 04/07/2023   LYMPHSABS 1.4 04/05/2023   MONOABS 0.8 04/05/2023   EOSABS 0.7 (H) 04/05/2023   BASOSABS 0.1 04/05/2023     Last metabolic panel Lab Results  Component Value Date   NA 135 04/07/2023   K 4.6 04/07/2023   CL 96 (L) 04/07/2023   CO2 21 (L) 04/07/2023   BUN 123 (H) 04/07/2023   CREATININE 10.62 (H) 04/07/2023   GLUCOSE 109 (H) 04/07/2023   GFRNONAA 5 (L) 04/07/2023   GFRAA 5 (L) 08/27/2019   CALCIUM 8.5 (L) 04/07/2023   PHOS 6.9 (H) 04/05/2023   PROT 6.4 (L) 03/24/2023   ALBUMIN 2.5 (L) 04/05/2023   LABGLOB 2.9 08/19/2016   AGRATIO 0.9 08/19/2016   BILITOT 0.7 03/24/2023   ALKPHOS 62 03/24/2023   AST 13 (L) 03/24/2023   ALT 14 03/24/2023   ANIONGAP 18 (H) 04/07/2023    CBG (last 3)  Recent Labs    04/06/23 1630  GLUCAP 75      Coagulation Profile: No results for input(s): "INR", "PROTIME" in the last 168 hours.   Radiology Studies: I have personally reviewed the imaging studies  No  results found.      Thad Ranger M.D. [facility] 04/07/2023, 1:16 PM  Available via Epic secure chat 7am-7pm After 7 pm, please refer to night coverage provider listed on amion.

## 2023-04-07 NOTE — Evaluation (Signed)
Physical Therapy Re-Evaluation Patient Details Name: [name]. MRN: [id number] DOB: 03-01-1970 Today's Date: 04/07/2023  History of Present Illness  The pt is a 53 yo male presenting 1/30 from HD with blurriness and weakness since BKA on 02/09/23. Admitted to [facility] for FTT, inability to manage at home, transferred to [facility] 2/6 for surgical debridement. 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 PMH includes:  ESRD on HD TTS, recent right BKA ([name]), CAD, CVA, paroxysmal atrial fibrillation not on anticoagulation due to history of ICH, gastroparesis, type 2 diabetes mellitus, diabetic retinopathy, glaucoma, and history of left eye enucleation.   Clinical Impression  The pt was seen for re-evaluation after R AKA 2/12, presents with significant limitations in RLE movement, ROM, strength, and general activity tolerance due to pain. With significant encouragement from his wife, he was able to attempt sitting EOB, but required modA of 2 to complete bed mobility and totalA of 2 to complete lateral scooting along EOB. Pt educated in ROM exercises for LLE and importance of continued attempts at upright and OOB mobility, but he will need significant rehab prior to return home. Recommend continued inpatient rehab >hours/day to facilitate return to maximal independence with transfers and mobility.       If plan is discharge home, recommend the following: A lot of help with bathing/dressing/bathroom;A lot of help with walking and/or transfers;Assistance with cooking/housework;Help with stairs or ramp for entrance   Can travel by private vehicle   No    Equipment Recommendations Wheelchair (measurements PT);Wheelchair cushion (measurements PT);Hoyer lift  Recommendations for [employer]  Rehab consult    Functional Status Assessment Patient has had a recent decline in their functional status and demonstrates the ability to make significant improvements in function in a reasonable and predictable amount of time.      Precautions / Restrictions Precautions Precautions: Fall Recall of Precautions/Restrictions: Intact Precaution/Restrictions Comments: R AKA with wound vac Restrictions Weight Bearing Restrictions Per Provider Order: No      Mobility  Bed Mobility Overal bed mobility: Needs Assistance Bed Mobility: Rolling, Sit to Supine, Sidelying to Sit Rolling: Used rails, Min assist Sidelying to sit: Mod assist, +2 for physical assistance, HOB elevated, Used rails   Sit to supine: Min assist, +2 for safety/equipment, HOB elevated   General bed mobility comments: increased assist due topain levels today, pt needing assist to manage LE and to elevate trunk. falling posteriorly    Transfers Overall transfer level: Needs assistance   Transfers: Bed to chair/wheelchair/BSC            Lateral/Scoot Transfers: Total assist, +2 physical assistance, +2 safety/equipment General transfer comment: pt declined due to pain initially, totalA (pt doing <25%) of lateral scoot along EOB       Balance Overall balance assessment: Needs assistance Sitting-balance support: Feet supported, Bilateral upper extremity supported Sitting balance-Leahy Scale: Fair Sitting balance - Comments: poor/fair as pt with posterior LOB without support, to CGA at times but easily loses balance with movement of LE or UE     Standing balance-Leahy Scale: Zero                               Pertinent Vitals/Pain Pain Assessment Pain Assessment: 0-10 Pain Score: 8  Faces Pain Scale: Hurts whole lot Pain Location: 8-9 for RLE, 6/10 for L heel Pain Descriptors / Indicators: Discomfort, Sore, Sharp, Throbbing Pain Intervention(s): Limited activity within patient's tolerance, Monitored during session, Premedicated before session,  Repositioned    Home Living Family/patient expects to be discharged to:: Private residence Living Arrangements: Spouse/significant other Available Help at Discharge:  Family;Available PRN/intermittently Type of Home: House Home Access: Ramped entrance       Home Layout: One level Home Equipment: Rollator (4 wheels);Rolling Walker (2 wheels);BSC/3in1      Prior Function Prior Level of Function : Needs assist             Mobility Comments: was household ambulator using Rollator prior to R BKA ADLs Comments: Pt reports assist for bathing and dressing. Reports using bed pan at baseline. Reports ability to feed himself.     Extremity/Trunk Assessment   Upper Extremity Assessment Upper Extremity Assessment: Defer to OT evaluation    Lower Extremity Assessment Lower Extremity Assessment: RLE deficits/detail;LLE deficits/detail RLE Deficits / Details: new R AKA, ROM limited by pain, reports sensation intact RLE: Unable to fully assess due to pain RLE Sensation: WNL RLE Coordination: WNL LLE Deficits / Details: gen weakness, grossly 4-/5 to MMT, pain at heel due to wound LLE: Unable to fully assess due to pain LLE Sensation: WNL    Cervical / Trunk Assessment Cervical / Trunk Assessment: Kyphotic  Communication   Communication Communication: No apparent difficulties    Cognition Arousal: Alert Behavior During Therapy: WFL for tasks assessed/performed, Agitated   PT - Cognitive impairments: No apparent impairments                       PT - Cognition Comments: impacted by pain, wife is a good motivator Following commands: Intact       Cueing Cueing Techniques: Verbal cues, Tactile cues     General Comments General comments (skin integrity, edema, etc.): VSS, wife present and is excellent motivator    Exercises General Exercises - Lower Extremity Long Arc Quad: AROM, Left, 5 reps, Seated Amputee Exercises Hip Extension: PROM, Right, 5 reps, Sidelying Hip ABduction/ADduction: PROM, Right, 5 reps, Sidelying Hip Flexion/Marching: AROM, Right, 5 reps, Seated Other Exercises Other Exercises: pt declined attempt at  chair push ups from EOB   Assessment/Plan    PT Assessment Patient needs continued PT services  PT Problem List Decreased strength;Decreased activity tolerance;Decreased balance;Decreased mobility       PT Treatment Interventions DME instruction;Gait training;Stair training;Functional mobility training;Therapeutic activities;Therapeutic exercise;Balance training;Patient/family education    PT Goals (Current goals can be found in the Care Plan section)  Acute Rehab PT Goals Patient Stated Goal: to be able to complete lateral transfer PT Goal Formulation: With patient Time For Goal Achievement: 04/21/23 Potential to Achieve Goals: Fair    Frequency Min 3X/week        AM-PAC PT "6 Clicks" Mobility  Outcome Measure Help needed turning from your back to your side while in a flat bed without using bedrails?: A Little Help needed moving from lying on your back to sitting on the side of a flat bed without using bedrails?: A Lot Help needed moving to and from a bed to a chair (including a wheelchair)?: Total Help needed standing up from a chair using your arms (e.g., wheelchair or bedside chair)?: Total Help needed to walk in hospital room?: Total Help needed climbing 3-5 steps with a railing? : Total 6 Click Score: 9    End of Session Equipment Utilized During Treatment: Gait belt Activity Tolerance: Patient limited by fatigue;Patient limited by pain Patient left: with call bell/phone within reach;in bed;with bed alarm set;with family/visitor present Nurse Communication: Mobility status;Need  for lift equipment PT Visit Diagnosis: Unsteadiness on feet (R26.81);Other abnormalities of gait and mobility (R26.89);Muscle weakness (generalized) (M62.81)    Time: 9604-5409 PT Time Calculation (min) (ACUTE ONLY): 29 min   Charges:   PT Evaluation $PT Re-evaluation: 1 Re-eval PT Treatments $Therapeutic Activity: 8-22 mins PT General Charges $$ ACUTE PT VISIT: 1 Visit          Vickki Muff, PT, DPT   Acute Rehabilitation Department Office [phone] Secure Chat Communication Preferred   Ronnie Derby 04/07/2023, 12:13 PM

## 2023-04-07 NOTE — Care Management Important Message (Signed)
Important Message  Patient Details  Name: [name]. MRN: [id number] Date of Birth: 09-10-1970   Important Message Given:  Yes - Medicare IM     Dorena Bodo 04/07/2023, 3:29 PM

## 2023-04-07 NOTE — Anesthesia Postprocedure Evaluation (Signed)
Anesthesia Post Note  Patient: [name].  Procedure(s) Performed: RIGHT ABOVE KNEE AMPUTATION (Right: Knee)     Patient location during evaluation: PACU Anesthesia Type: General Level of consciousness: awake and alert Pain management: pain level controlled Vital Signs Assessment: post-procedure vital signs reviewed and stable Respiratory status: spontaneous breathing, nonlabored ventilation and respiratory function stable Cardiovascular status: blood pressure returned to baseline and stable Postop Assessment: no apparent nausea or vomiting Anesthetic complications: no   No notable events documented.  Last Vitals:  Vitals:   04/07/23 0337 04/07/23 0452  BP: (!) 140/63 (!) 171/70  Pulse: (!) 54 (!) 57  Resp:  18  Temp:  36.6 C  SpO2:  97%    Last Pain:  Vitals:   04/07/23 0724  TempSrc:   PainSc: 10-Worst pain ever                 Lowella Curb

## 2023-04-07 NOTE — TOC Progression Note (Signed)
Transition of Care (TOC) - Progression Note    Patient Details  Name: [name]. MRN: [id number] Date of Birth: [birth year]  Transition of Care [facility]) CM/SW Contact  Erin Sons, [place] Phone Number: 04/07/2023, 1:15 PM  Clinical Narrative:     CSW met with pt and pt's spouse to confirm plan for SNF. They would still like pt to go to [facility]. CSW contacted [facility]. They are checking when an open bed would be available. Since this conversation, PT has updated rec to CIR. TOC will continue to follow.     Barriers to Discharge: English as a second language teacher                                          Social Determinants of Health (SDOH) Interventions SDOH Screenings   Food Insecurity: Patient Declined (03/31/2023)  Housing: Patient Declined (03/31/2023)  Transportation Needs: Patient Declined (03/31/2023)  Utilities: Patient Declined (03/31/2023)  Financial Resource Strain: High Risk (07/05/2017)  Physical Activity: Inactive (07/05/2017)  Social Connections: Unknown (03/31/2023)  Stress: Stress Concern Present (07/05/2017)  Tobacco Use: Low Risk  (04/06/2023)  Health Literacy: Low Risk  (06/01/2020)   Received from [facility], [facility]    Readmission Risk Interventions    07/27/2021   11:49 AM 11/27/2020    3:06 PM  Readmission Risk Prevention Plan  Transportation Screening Complete Complete  PCP or Specialist Appt within 3-5 Days  Complete  HRI or Home Care Consult  Complete  Social Work Consult for Recovery Care Planning/Counseling  Complete  Palliative Care Screening  Not Applicable  Medication Review Oceanographer) Complete Complete  SW Recovery Care/Counseling Consult Complete   Palliative Care Screening Not Applicable   Skilled Nursing Facility Not Applicable

## 2023-04-07 NOTE — Evaluation (Signed)
Occupational Therapy Re-Evaluation Patient Details Name: [name]. MRN: [id number] DOB: 1970/07/21 Today's Date: 04/07/2023   History of Present Illness   The pt is a 53 yo male presenting 1/30 from HD with blurriness and weakness since BKA on 02/09/23. Admitted to [facility] for FTT, inability to manage at home, transferred to [facility] 2/6 for surgical debridement. 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 PMH includes:  ESRD on HD TTS, recent right BKA ([name]), CAD, CVA, paroxysmal atrial fibrillation not on anticoagulation due to history of ICH, gastroparesis, type 2 diabetes mellitus, diabetic retinopathy, glaucoma, and history of left eye enucleation.     Clinical Impressions [name] significant pain with movement, 10/10 to R residual extremity, agreed to participate. Pt wife present during session, along with rehab MD. Pt motivated to improve, states his recent stay at SNF he only received therapy 1-2X/week and was not able to get stronger with so little OOB activity. Pt is hopeful to maximize therapy to quickly get stronger for returning home. Pt lives at home with wife who works during the day, hopeful that husband will be able to transfer to [facility] or w/c with lateral scoot at home setting. Pt currently requires mod A for bed mobility, max A with lateral scooting on bed with BUEs and LLE for support. Pt motivated to improve, recommending postacute intensive rehab>3hrs/day to maximize strength for transfers and bed mobility, will continue to see acutely to improve as able.      If plan is discharge home, recommend the following:   A lot of help with walking and/or transfers;A lot of help with bathing/dressing/bathroom;Assistance with cooking/housework;Assist for transportation;Help with stairs or ramp for entrance     Functional Status Assessment   Patient has had a recent decline in their functional status and demonstrates the ability to make significant improvements in function in a reasonable and predictable  amount of time.     Equipment Recommendations   Other (comment) (defer to next venue, may need hoyer)     Recommendations for Other Services   Rehab consult     Precautions/Restrictions   Precautions Precautions: Fall Recall of Precautions/Restrictions: Intact Precaution/Restrictions Comments: R AKA with wound vac Restrictions Weight Bearing Restrictions Per Provider Order: No     Mobility Bed Mobility Overal bed mobility: Needs Assistance Bed Mobility: Supine to Sit, Sit to Supine     Supine to sit: Mod assist, HOB elevated, Used rails Sit to supine: Mod assist, Used rails   General bed mobility comments: mod A in/out of bed    Transfers Overall transfer level: Needs assistance                Lateral/Scoot Transfers: Max assist, +2 physical assistance General transfer comment: max A to laterally scoot on EOB, assisted a little with LLE and BUEs      Balance Overall balance assessment: Needs assistance Sitting-balance support: Feet supported, Bilateral upper extremity supported Sitting balance-Leahy Scale: Fair Sitting balance - Comments: once sitting EOB able to maintain balance, does have difficulty getting to EOB without support     Standing balance-Leahy Scale: Zero                             ADL either performed or assessed with clinical judgement   ADL Overall ADL's : Needs assistance/impaired Eating/Feeding: Set up;Sitting   Grooming: Set up;Minimal assistance;Sitting   Upper Body Bathing: Minimal assistance;Sitting   Lower Body Bathing: Maximal assistance;Sitting/lateral leans  Upper Body Dressing : Minimal assistance;Sitting   Lower Body Dressing: Maximal assistance;Sitting/lateral leans       Toileting- Clothing Manipulation and Hygiene: Maximal assistance;Sitting/lateral lean         General ADL Comments: Pt has difficulty with LB ADLs, painful R residual extremity     Vision Baseline Vision/History: 2  Legally blind Ability to See in Adequate Light: 2 Moderately impaired Patient Visual Report: Blurring of vision       Perception         Praxis         Pertinent Vitals/Pain Pain Assessment Pain Assessment: Faces Faces Pain Scale: Hurts whole lot Pain Location: 8-9 for RLE, 6/10 for L heel Pain Descriptors / Indicators: Discomfort, Sore, Sharp, Throbbing Pain Intervention(s): Monitored during session     Extremity/Trunk Assessment Upper Extremity Assessment Upper Extremity Assessment: Generalized weakness RUE Deficits / Details: R 4th digit not present, generalized weakness LUE Deficits / Details: 3rd digit tip of finger distal from DIP. generalized weakness, L shoudler stiff/weak LUE: Shoulder pain with ROM   Lower Extremity Assessment Lower Extremity Assessment: RLE deficits/detail;LLE deficits/detail RLE Deficits / Details: new R AKA, ROM limited by pain, reports sensation intact RLE: Unable to fully assess due to pain RLE Sensation: WNL RLE Coordination: WNL LLE Deficits / Details: gen weakness, grossly 4-/5 to MMT, pain at heel due to wound LLE: Unable to fully assess due to pain LLE Sensation: WNL   Cervical / Trunk Assessment Cervical / Trunk Assessment: Kyphotic   Communication Communication Communication: No apparent difficulties   Cognition Arousal: Alert Behavior During Therapy: WFL for tasks assessed/performed, Agitated Cognition: No apparent impairments                               Following commands: Intact       Cueing  General Comments          Exercises     Shoulder Instructions      Home Living Family/patient expects to be discharged to:: Private residence Living Arrangements: Spouse/significant other Available Help at Discharge: Family;Available PRN/intermittently Type of Home: House Home Access: Ramped entrance     Home Layout: One level     Bathroom Shower/Tub: Walk-in shower;Sponge bathes at baseline    Bathroom Toilet: Standard Bathroom Accessibility: No   Home Equipment: Rollator (4 wheels);Rolling Walker (2 wheels);BSC/3in1          Prior Functioning/Environment Prior Level of Function : Needs assist             Mobility Comments: was household ambulator using Rollator prior to R BKA ADLs Comments: Pt reports assist for bathing and dressing. Reports using bed pan at baseline. Reports ability to feed himself.    OT Problem List: Decreased strength;Decreased range of motion;Decreased activity tolerance;Impaired balance (sitting and/or standing);Impaired UE functional use   OT Treatment/Interventions: Self-care/ADL training;Therapeutic exercise;DME and/or AE instruction;Therapeutic activities;Patient/family education      OT Goals(Current goals can be found in the care plan section)   Acute Rehab OT Goals Patient Stated Goal: to improve strength OT Goal Formulation: With patient/family Time For Goal Achievement: 04/21/23 Potential to Achieve Goals: Fair   OT Frequency:  Min 1X/week    Co-evaluation              AM-PAC OT "6 Clicks" Daily Activity     Outcome Measure Help from another person eating meals?: A Little Help from another person taking care of  personal grooming?: A Little Help from another person toileting, which includes using toliet, bedpan, or urinal?: A Lot Help from another person bathing (including washing, rinsing, drying)?: A Lot Help from another person to put on and taking off regular upper body clothing?: A Little Help from another person to put on and taking off regular lower body clothing?: A Lot 6 Click Score: 15   End of Session Equipment Utilized During Treatment: Gait belt Nurse Communication: Mobility status  Activity Tolerance: Patient tolerated treatment well Patient left: in bed;with call bell/phone within reach;with family/visitor present  OT Visit Diagnosis: Unsteadiness on feet (R26.81);Other abnormalities of gait and  mobility (R26.89);Muscle weakness (generalized) (M62.81)                Time: 1610-9604 OT Time Calculation (min): 32 min Charges:  OT General Charges $OT Visit: 1 Visit OT Evaluation $OT Re-eval: 1 Re-eval OT Treatments $Self Care/Home Management : 8-22 mins  Canyon Day, OTR/L   Alexis Goodell 04/07/2023, 2:57 PM

## 2023-04-07 NOTE — Progress Notes (Signed)
Inpatient Rehab Admissions Coordinator Note:   Per updated therapy recommendations patient was screened for CIR candidacy by Stephania Fragmin, PT. At this time, pt appears to be a potential candidate for CIR. I will place an order for rehab consult for full assessment, per our protocol.  Please contact me any with questions.Estill Dooms, PT, DPT [phone] 04/07/23 12:33 PM

## 2023-04-07 NOTE — Consult Note (Signed)
Physical Medicine and Rehabilitation Consult Reason for Consult:Rehab? Referring Physician: Dr [name]   HPI: [name]. is a 53 y.o.  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  Per pt, went home  10 days prior to admission- was at SNF for 19 days per wife and didn't get much better there- didn't have a lot of therapy and sent home early- unable to care for self due to weakness and was looking for more permanent placement at SNF since she couldn't care for him prior to admission.  Currently- pt reports pain is "awful"- has IV Dialudid he's been leaning on because pain so bad- VAC decreased to -75 mmhg from -125 mmhg by Dr [name] because pain was so bad he was screaming.  Said it's hard to do anything because is so sore.   LBM 2/11 before recent surgery . Doesn't form urine  So far worked with PT- mod-total A of 2 with PT and mod-max A with OT today.     ROS An entire ROS was completed and found to be negative except for HPI Past Medical History:  Diagnosis Date   Acute Bil  CVAs of Bil Frontal amd Rt Parietal  in Watershed Distribution 04/21/2021   Patchy acute cortically-based infarcts within the bilateral high  frontal lobes and right parietal lobe (predominantly in a watershed  distribution).   Subcentimeter acute infarct within the callosal splenium on the  left.     Acute bilateral cerebral infarction in a watershed distribution [facility]) 04/21/2021   a.) MRI brain 04/21/2021 --> patchy ACUTE cortically-based infarcts within the bilateral high frontal  lobes and right parietal lobe   Acute cerebral infarction (HCC) 02/13/2021   a.) MRI brain 02/13/2021: acute LEFT hippocampal infarct   Acute cerebral infarction (HCC) 04/21/2021   a.) MRI brain 04/21/2021: subcentimeter ACUTE infarct within the callosal splenium on the LEFT   Acute osteomyelitis of toe of right foot with gangrene (HCC) 02/26/2022   Anaphylactic reaction due to adverse effect of correct drug or medicament properly administered, initial encounter 05/08/2019   Anemia of chronic renal failure    Anxiety    Aortic dilatation (HCC) 11/25/2020   a.) TTE 11/25/2020: Ao root measured 38 mm. b.) TTE 04/22/2021: Ao root measured 44 mm; c. 11/2021 Echo: Ao root 40mm.   Atrial fibrillation (HCC)    a.) CHA2DS2-VASc = 6 (CHF, HTN, CVA x2, prior MI, T2DM). b.) rate/rhythm maintained on oral amiodarone + carvedilol; chronic OAC/AP therapy discontinued following ICH   Benign prostatic hyperplasia with lower urinary tract symptoms 11/07/2019   BPH (benign prostatic hyperplasia)    Cardiac arrest (HCC) 07/26/2021   a.) in setting of hyperkalemia, NSTEMI, and seizure following missed HD; pulseless and apneic --> required 1 round of CPR prior to ROSC   Cellulitis    Cerebral hemorrhage (HCC) 11/21/2021   a.) CT head 11/21/2021 --> 1.4 x 1.5 x 3.1 cm ACUTE hemorrhage within the inferior aspect of the fourth ventricle, and extending inferiorly through the foramen of foramen of [place] -->  occurred in setting of HTN emergency and DOAC/APT-->eliquis/plavix d/c'd.   Cerebral microvascular disease    Chronic diarrhea    Chronic heart failure with preserved ejection fraction (HFpEF) (HCC) 07/19/2017   a.) 06/2017 Echo: EF 75%; b.) 11/2020 Echo: EF 55-60%; c.) 04/2021 Echo: EF 55-60%; d.) 11/2021 Echo: EF 65-70%, GrII DD, nl RV fxn, sev dil LA, large circumferential pericardial eff w/o tamponade, triv MR, AoV sclerosis. Ao root 40mm; e.) TTE 02/28/2025: EF 60-65%, sev LVH, mod posterior LV effusion, AoV  sclerosis   Chronic pain of both ankles    Coronary artery disease 02/26/2020   a. 02/2020 Ant STEMI/PCI: LM nl, LAD 12m (3.5x30 Resolute Onyx), RI nl, LCX min irregs, RCA min irregs. EF 65%.   DDD (degenerative disc disease), cervical    Diabetes mellitus, type II (HCC)    Diabetic neuropathy (HCC)    Esophagitis    ESRD (end stage renal disease) on dialysis (HCC)    a.) [facility]; T-Th-Sat   Frequent falls    Gait instability    Gastritis    Gastroparesis    GERD (gastroesophageal reflux disease)    H/O enucleation of left eyeball    Heart palpitations    History of kidney stones    HLD (hyperlipidemia)    Hypertension    Insomnia    a.) uses melatonin PRN   Long term current use of amiodarone    Nausea and vomiting in adult    recurrent   NSTEMI (non-ST elevated myocardial infarction) (HCC) 07/26/2021   a.) hyperkalemic from missed HD; seizures; decompensated to cardiac arrest and required CPR x 1 round prior to ROSC.   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  Added automatically from request for surgery [id number]     Osteomyelitis [name])    Pericardial effusion    a. 11/2021 Echo: EF 65-70%, GrII DD, nl RV fxn, sev dil LA, large circumferential pericardial eff w/o tamponade, triv MR, AoV sclerosis. Ao root 40mm.   Seizure (HCC)    a.) last 07/26/2021 in setting of missed HD --> hyperkalemic at 6.5 --> pulseless/apneic and required CPR; discharged home on levetiracetam.   ST elevation myocardial infarction (STEMI) of anterior wall (HCC) 02/26/2020   a.) LHC/PCI 02/26/2020 --> EF 65%; LVEDP 11 mmHg; 90% mLAD (3.5 x 20 mm Resolute Onyx DES x 1)   Past Surgical History:  Procedure Laterality Date   A/V FISTULAGRAM Left 08/31/2021   Procedure: A/V Fistulagram;  Surgeon: Annice Needy, MD;  Location: [name] INVASIVE CV LAB;  Service: Cardiovascular;  Laterality: Left;   A/V FISTULAGRAM Left 01/25/2022    Procedure: A/V Fistulagram;  Surgeon: Annice Needy, MD;  Location: [name] INVASIVE CV LAB;  Service: Cardiovascular;  Laterality: Left;   A/V FISTULAGRAM Right 10/18/2022   Procedure: A/V Fistulagram;  Surgeon: Annice Needy, MD;  Location: [name] INVASIVE CV LAB;  Service: Cardiovascular;  Laterality: Right;   ABDOMINAL AORTOGRAM W/LOWER EXTREMITY Bilateral 02/08/2023   Procedure: ABDOMINAL AORTOGRAM W/LOWER EXTREMITY;  Surgeon: Daria Pastures, MD;  Location: [facility] INVASIVE CV LAB;  Service: Vascular;  Laterality: Bilateral;   AMPUTATION Left 03/16/2016   Procedure: AMPUTATION DIGIT LEFT HALLUX;  Surgeon: Vivi Barrack, DPM;  Location: [facility] OR;  Service: Podiatry;  Laterality: Left;  can start around 5    AMPUTATION Right 02/09/2021   Procedure: AMPUTATION DIGIT;  Surgeon: Marlyne Beards, MD;  Location: [facility] OR;  Service: Orthopedics;  Laterality: Right;   AMPUTATION Right 02/04/2023   Procedure: AMPUTATION 4TH AND 5TH METATARSAL;  Surgeon: Pilar Plate, DPM;  Location: [facility] OR;  Service: Orthopedics/Podiatry;  Laterality: Right;  I&D, partial ray resection, abx beads   AMPUTATION Right 02/09/2023   Procedure: RIGHT BELOW KNEE AMPUTATION;  Surgeon: [name], MD;  Location: [facility] OR;  Service: Orthopedics;  Laterality: Right;   AMPUTATION Right 04/06/2023   Procedure: RIGHT ABOVE KNEE AMPUTATION;  Surgeon: [name], MD;  Location: [facility] OR;  Service: Orthopedics;  Laterality: Right;   AMPUTATION TOE Right 02/28/2022   Procedure: AMPUTATION TOE;  Surgeon: Louann Sjogren, DPM;  Location: [name] ORS;  Service: Podiatry;  Laterality: Right;  right fifth toe   ARTHROSCOPIC REPAIR ACL Left    AV FISTULA PLACEMENT Right 05/31/2019   Procedure: Brachiocephalic AV fistula creation;  Surgeon: Annice Needy, MD;  Location: [name] ORS;  Service: Vascular;  Laterality: Right;   AV FISTULA PLACEMENT Left 08/13/2021   Procedure: ARTERIOVENOUS (AV) FISTULA CREATION (BRACHIALCEPHALIC);  Surgeon: Annice Needy, MD;  Location: [name] ORS;  Service: Vascular;  Laterality: Left;   COLONOSCOPY WITH PROPOFOL N/A 10/28/2015   Procedure: COLONOSCOPY WITH PROPOFOL;  Surgeon: Christena Deem, MD;  Location: [facility] ENDOSCOPY;  Service: Endoscopy;  Laterality: N/A;   COLONOSCOPY WITH PROPOFOL N/A 10/29/2015   Procedure: COLONOSCOPY WITH PROPOFOL;  Surgeon: Christena Deem, MD;  Location: [facility] ENDOSCOPY;  Service: Endoscopy;  Laterality: N/A;   COLONOSCOPY WITH PROPOFOL N/A 01/27/2023   Procedure: COLONOSCOPY WITH PROPOFOL;  Surgeon: Franky Macho, MD;  Location: [facility] ENDO SUITE;  Service: Endoscopy;  Laterality: N/A;  12:30pm, asa 3   CORONARY ULTRASOUND/IVUS N/A 02/26/2020   Procedure: Intravascular Ultrasound/IVUS;  Surgeon: Corky Crafts, MD;  Location: [facility] INVASIVE CV LAB;  Service: Cardiovascular;  Laterality: N/A;   CORONARY/GRAFT ACUTE MI REVASCULARIZATION N/A 02/26/2020   Procedure: Coronary/Graft Acute MI Revascularization;  Surgeon: Corky Crafts, MD;  Location: [facility] INVASIVE CV LAB;  Service: Cardiovascular;  Laterality: N/A;   DIALYSIS/PERMA CATHETER INSERTION Right 04/26/2019   Perm Cath    DIALYSIS/PERMA CATHETER INSERTION N/A 04/26/2019   Procedure: DIALYSIS/PERMA CATHETER INSERTION;  Surgeon: Annice Needy, MD;  Location: [name] INVASIVE CV LAB;  Service: Cardiovascular;  Laterality: N/A;   DIALYSIS/PERMA CATHETER INSERTION N/A 05/25/2021   Procedure: DIALYSIS/PERMA CATHETER INSERTION;  Surgeon: Annice Needy, MD;  Location: [name] INVASIVE CV LAB;  Service: Cardiovascular;  Laterality: N/A;   DIALYSIS/PERMA CATHETER INSERTION N/A 08/31/2021   Procedure: DIALYSIS/PERMA CATHETER INSERTION;  Surgeon: Annice Needy, MD;  Location: [name] INVASIVE CV LAB;  Service: Cardiovascular;  Laterality: N/A;   DIALYSIS/PERMA CATHETER REMOVAL N/A 08/15/2019   Procedure: DIALYSIS/PERMA CATHETER REMOVAL;  Surgeon: Renford Dills, MD;  Location: [name] INVASIVE CV LAB;  Service: Cardiovascular;  Laterality:  N/A;   DIALYSIS/PERMA CATHETER REMOVAL N/A 03/15/2022   Procedure: DIALYSIS/PERMA CATHETER REMOVAL;  Surgeon: Annice Needy, MD;  Location: [name] INVASIVE CV LAB;  Service: Cardiovascular;  Laterality: N/A;   EMBOLIZATION (CATH LAB) Right 02/06/2021   Procedure: EMBOLIZATION;  Surgeon: Annice Needy, MD;  Location: [name] INVASIVE CV LAB;  Service: Cardiovascular;  Laterality: Right;  Right Upper Extremity Dialysis Access, Permcath Placement   ESOPHAGOGASTRODUODENOSCOPY (EGD) WITH PROPOFOL N/A 12/27/2017   Procedure: ESOPHAGOGASTRODUODENOSCOPY (EGD) WITH PROPOFOL;  Surgeon: Toledo, Boykin Nearing, MD;  Location: [name] ENDOSCOPY;  Service: Gastroenterology;  Laterality: N/A;   EYE SURGERY     LEFT HEART CATH AND CORONARY ANGIOGRAPHY N/A 02/26/2020  Procedure: LEFT HEART CATH AND CORONARY ANGIOGRAPHY;  Surgeon: Corky Crafts, MD;  Location: [facility] INVASIVE CV LAB;  Service: Cardiovascular;  Laterality: N/A;   PROSTATE SURGERY  2016   REVISON OF ARTERIOVENOUS FISTULA Right 07/22/2022   Procedure: RESECTION OF ANEURYSMAL RIGHT ARM ARTERIOVENOUS FISTULA;  Surgeon: Annice Needy, MD;  Location: [name] ORS;  Service: Vascular;  Laterality: Right;   TONSILECTOMY/ADENOIDECTOMY WITH MYRINGOTOMY     TONSILLECTOMY     UPPER EXTREMITY ANGIOGRAPHY Right 11/26/2020   Procedure: Upper Extremity Angiography;  Surgeon: Annice Needy, MD;  Location: [name] INVASIVE CV LAB;  Service: Cardiovascular;  Laterality: Right;   UPPER EXTREMITY ANGIOGRAPHY Right 02/05/2021   Procedure: UPPER EXTREMITY ANGIOGRAPHY;  Surgeon: Annice Needy, MD;  Location: [name] INVASIVE CV LAB;  Service: Cardiovascular;  Laterality: Right;   Family History  Problem Relation Age of Onset   CAD Father    Stroke Father    Diabetes Mellitus II Mother    Kidney failure Mother    Schizophrenia Mother    Social History:  reports that he has never smoked. He has never used smokeless tobacco. He reports that he does not drink alcohol and does not use  drugs. Allergies:  Allergies  Allergen Reactions   Promethazine Diarrhea and Other (See Comments)    Muscle cramps, cramping "all over"   Medications Prior to Admission  Medication Sig Dispense Refill   acetaminophen (TYLENOL) 325 MG tablet Take 1-2 tablets (325-650 mg total) by mouth every 4 (four) hours as needed for mild pain.     amiodarone (PACERONE) 200 MG tablet Take 1 tablet (200 mg total) by mouth every morning. 90 tablet 2   atorvastatin (LIPITOR) 80 MG tablet TAKE ONE TABLET BY MOUTH AT BEDTIME. 90 tablet 2   calcitRIOL (ROCALTROL) 0.25 MCG capsule Take 9 capsules (2.25 mcg total) by mouth every Tuesday, Thursday, and Saturday at 6 PM.     calcium acetate (PHOSLO) 667 MG capsule Take 2 capsules (1,334 mg total) by mouth 3 (three) times daily with meals. 180 capsule 0   carvedilol (COREG) 12.5 MG tablet Take 1 tablet (12.5 mg total) by mouth 2 (two) times daily.     cinacalcet (SENSIPAR) 30 MG tablet Take 2 tablets (60 mg total) by mouth every Tuesday, Thursday, and Saturday at 6 PM.     [EXPIRED] clindamycin (CLEOCIN) 300 MG capsule Take 300 mg by mouth 3 (three) times daily.     dorzolamide-timolol (COSOPT) 2-0.5 % ophthalmic solution Place 1 drop into the right eye 2 (two) times daily.     hydrocortisone (ANUSOL-HC) 2.5 % rectal cream Place 1 Application rectally 2 (two) times daily. 30 g 0   levETIRAcetam (KEPPRA) 500 MG tablet Take 1 tablet (500 mg total) by mouth 4 (four) times a week. On Sunday, Monday, Wednesday and Friday, all non-dialysis days     levETIRAcetam (KEPPRA) 750 MG tablet Take 1 tablet (750 mg total) by mouth 3 (three) times a week. On Tuesday Thursday and Saturday, on dialysis days     lidocaine-prilocaine (EMLA) cream Apply 1 Application topically 3 (three) times a week.     loperamide (IMODIUM) 2 MG capsule Take 1 capsule (2 mg total) by mouth as needed for diarrhea or loose stools.     midodrine (PROAMATINE) 5 MG tablet Take 1 tablet (5 mg total) by mouth 3  (three) times daily with meals.     nutrition supplement, JUVEN, (JUVEN) PACK Take 1 packet by mouth 2 (two) times daily  between meals.     Nutritional Supplements (,FEEDING SUPPLEMENT, PROSOURCE PLUS) liquid Take 30 mLs by mouth 3 (three) times daily between meals.     ondansetron (ZOFRAN) 4 MG tablet Take 1 tablet (4 mg total) by mouth every 6 (six) hours as needed for nausea or vomiting. 12 tablet 0    Home: Home Living Family/patient expects to be discharged to:: Private residence Living Arrangements: Spouse/significant other Available Help at Discharge: Family, Available PRN/intermittently Type of Home: House Home Access: Ramped entrance Home Layout: One level Bathroom Shower/Tub: Walk-in shower, Sponge bathes at baseline Bathroom Toilet: Standard Bathroom Accessibility: No Home Equipment: Rollator (4 wheels), Rolling Walker (2 wheels), BSC/3in1 Additional Comments: per chart  Functional History: Prior Function Prior Level of Function : Needs assist Physical Assist : Mobility (physical), ADLs (physical) ADLs (physical): IADLs, Bathing, Dressing Mobility Comments: was household ambulator using Rollator prior to R BKA ADLs Comments: Pt reports assist for bathing and dressing. Reports using bed pan at baseline. Reports ability to feed himself. Functional Status:  Mobility: Bed Mobility Overal bed mobility: Needs Assistance Bed Mobility: Supine to Sit, Sit to Supine Rolling: Used rails, Min assist Sidelying to sit: Mod assist, +2 for physical assistance, HOB elevated, Used rails Supine to sit: Mod assist, HOB elevated, Used rails Sit to supine: Mod assist, Used rails Sit to sidelying: Min assist General bed mobility comments: mod A in/out of bed Transfers Overall transfer level: Needs assistance Equipment used: Rolling walker (2 wheels) Transfers: Bed to chair/wheelchair/BSC Sit to Stand: Max assist, +2 physical assistance Bed to/from chair/wheelchair/BSC transfer type::  Lateral/scoot transfer Stand pivot transfers: Mod assist Step pivot transfers: Mod assist, Max assist  Lateral/Scoot Transfers: Max assist, +2 physical assistance Transfer via Lift Equipment: Maximove General transfer comment: max A to laterally scoot on EOB, assisted a little with LLE and BUEs Ambulation/Gait Ambulation/Gait assistance: Max Chemical engineer (Feet): 2 Feet Assistive device: Rolling walker (2 wheels) Gait Pattern/deviations: Decreased step length - right, Decreased step length - left, Decreased stride length, Shuffle General Gait Details: unable to complete due to pain in L foot wounds Gait velocity: slow    ADL: ADL Overall ADL's : Needs assistance/impaired Eating/Feeding: Set up, Sitting Grooming: Set up, Minimal assistance, Sitting Upper Body Bathing: Minimal assistance, Sitting Lower Body Bathing: Maximal assistance, Sitting/lateral leans Upper Body Dressing : Minimal assistance, Sitting Lower Body Dressing: Maximal assistance, Sitting/lateral leans Lower Body Dressing Details (indicate cue type and reason): Pt attemtped to don L LE sock with much effort and time, but assist still needed. Attempted seated in recliner. Toilet Transfer: Maximal assistance, Stand-pivot, Rolling walker (2 wheels) Toilet Transfer Details (indicate cue type and reason): Simulated via EOB to chair transfer with RW. Toileting- Clothing Manipulation and Hygiene: Maximal assistance, Sitting/lateral lean General ADL Comments: Pt has difficulty with LB ADLs, painful R residual extremity  Cognition: Cognition Overall Cognitive Status: Within Functional Limits for tasks assessed Orientation Level: Oriented X4 Cognition Arousal: Alert Behavior During Therapy: WFL for tasks assessed/performed, Agitated Overall Cognitive Status: Within Functional Limits for tasks assessed  Blood pressure (!) 142/63, pulse 61, temperature 98.8 F (37.1 C), temperature source Oral, resp. rate 18, height  6\' 3"  (1.905 m), weight 80 kg, SpO2 100%. Physical Exam Vitals and nursing note reviewed. Exam conducted with a chaperone present.  Constitutional:      Comments: Pt awake, alert, appropriate, sitting up in bed; OT in room; and wife, in a lot of pain per pt as well as signs of acute pain, No  acute distress  HENT:     Head: Normocephalic.     Comments: L eye enucleation- kept eye closed R eye vision "hazy" No facial droop seen    Right Ear: External ear normal.     Left Ear: External ear normal.     Nose: Nose normal. No congestion.     Mouth/Throat:     Mouth: Mucous membranes are dry.     Pharynx: No oropharyngeal exudate.  Eyes:     General:        Right eye: No discharge.        Left eye: No discharge.  Cardiovascular:     Rate and Rhythm: Normal rate. Rhythm irregular.     Heart sounds: Normal heart sounds. No murmur heard.    No gallop.  Pulmonary:     Effort: Pulmonary effort is normal. No respiratory distress.     Breath sounds: Normal breath sounds. No wheezing, rhonchi or rales.  Abdominal:     General: Bowel sounds are normal. There is no distension.     Palpations: Abdomen is soft.     Tenderness: There is no abdominal tenderness.  Musculoskeletal:     Cervical back: Neck supple.     Comments: Ue on R- 5-/5 LUE- deltoid 2-/5 due to hx of RTC injury; otherwise 4/5 RLE- 2-/5 due to pain and R AKA with VAC in place LLE- 3-/5 throughout  Skin:    Comments: VAC on R AKA Wounds on L foot with dressings intact on L heel and forefoot  Neurological:     Comments: Decreased to light touch in L foot/ankle says rest of leg intact Ox3  Psychiatric:     Comments: Frustrated and somewhat anxious- likely due to pain     Results for orders placed or performed during the hospital encounter of 03/24/23 (from the past 24 hours)  CBC     Status: Abnormal   Collection Time: 04/07/23 11:59 AM  Result Value Ref Range   WBC 8.8 4.0 - 10.5 K/uL   RBC 3.48 (L) 4.22 - 5.81 MIL/uL    Hemoglobin 7.9 (L) 13.0 - 17.0 g/dL   HCT 16.1 (L) 09.6 - 04.5 %   MCV 75.0 (L) 80.0 - 100.0 fL   MCH 22.7 (L) 26.0 - 34.0 pg   MCHC 30.3 30.0 - 36.0 g/dL   RDW 40.9 (H) 81.1 - 91.4 %   Platelets 172 150 - 400 K/uL   nRBC 0.0 0.0 - 0.2 %  Basic metabolic panel     Status: Abnormal   Collection Time: 04/07/23 11:59 AM  Result Value Ref Range   Sodium 135 135 - 145 mmol/L   Potassium 4.6 3.5 - 5.1 mmol/L   Chloride 96 (L) 98 - 111 mmol/L   CO2 21 (L) 22 - 32 mmol/L   Glucose, Bld 109 (H) 70 - 99 mg/dL   BUN 782 (H) 6 - 20 mg/dL   Creatinine, Ser 95.62 (H) 0.61 - 1.24 mg/dL   Calcium 8.5 (L) 8.9 - 10.3 mg/dL   GFR, Estimated 5 (L) >60 mL/min   Anion gap 18 (H) 5 - 15   No results found.   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 Yes Co-Morbidities requiring supervision/potential complications: eye enucleation on L and visual changes-chronic in R eye; pAfib  no AC; seizure d/c; DM- hx of ICH/CVA; CAD; glaucoma; ESRD on HD T/H/S-  Due to bowel management, safety, skin/wound care, disease management, medication administration, pain management, and patient education, does the patient require 24 hr/day rehab nursing? Yes Does the patient require coordinated care of a physician, rehab nurse, therapy disciplines of PT and OT to address physical and functional deficits in the context of the above medical diagnosis(es)? Yes Addressing deficits in the following areas: balance, endurance, strength, transferring, bathing, dressing, feeding, grooming, and toileting Can the patient actively participate in an intensive therapy program of at least 3 hrs of therapy per day at least 5 days per week? Potentially The potential for patient to make measurable gains while on inpatient rehab is good and fair Anticipated  functional outcomes upon discharge from inpatient rehab are modified independent  with PT, modified independent with OT, n/a with SLP. Has to get to Mod I to go home- has no other assistance except wife at night Estimated rehab length of stay to reach the above functional goals is: 2-2.5 weeks? If can tolerate 3 hours/day Anticipated discharge destination:  not clear yet Overall Rehab/Functional Prognosis: good and fair  RECOMMENDATIONS: This patient's condition is appropriate for continued rehabilitative care in the following setting:  Not clear yet- pt has concerning dispo- wife is the only one who can help, but works 30-35 minutes away- says that an uncle and her mother can come check on him, but cannot help physically at all. Also is different dispo described to Admissions coordinator Patient has agreed to participate in recommended program. Potentially Note that insurance prior authorization may be required for reimbursement for recommended care.  Comment:  Pt needs to be able to do mod Assist at w/c level before comes to CIR- because right now is mod-Total A of 2 with PT and mod A just for supine to sit with OT and max assist to scoot < 1 foot- he needs to be able to get to mod I, which I'm concerned he will not be able to get to in the 2-3 weeks he's at rehab- he was at SNF for 19 days and made very little progress, however it could just be complicated by the fact was getting infection in R BKA, so want to get him the benefit of the doubt, to see if could maybe come to CIR.  It's also complicated by pt's ESRD- on HD, and enucleation of  eye and visual changes in other eye. He also has wounds on L foot as well and hx of pressure ulcers on his buttocks that are now sore.   Strongly suggest getting pain under better control- using Dilaudid IV, instead of PO Oxy since so much difference in pain control- suggest Oxy 10-15 mg q4 hours prn.  Will need assistance to work on his bowels due to pain  meds. Thank you for this consult- d/w admissions coordinator- will give to [facility] to deal with in more detail    I spent a total of  84  minutes on total care today- >50% coordination of care- due to d/w admission coordinator about what she got vs me; stayed for entire OT session to see what pt could do- interview and exam and d/w wife; and documentation     Genice Rouge, MD 04/07/2023

## 2023-04-07 NOTE — Progress Notes (Signed)
Patient ID: [name] Barns., male   DOB: 1970/04/24, 53 y.o.   MRN: [id number] Patient is postop day 1 right above-knee amputation.  Patient has no drainage in the wound VAC canister.  Patient complains of too much compression from the wound VAC.  I have changed the setting from -125 to -75.  I will add a prescription for Dilaudid for pain.  Discussed the importance of floating the left heel to ensure there is no pressure on the decubitus ulcer that patient developed while pushing himself around in bed.  Patient may ambulate without the boot.

## 2023-04-08 DIAGNOSIS — H538 Other visual disturbances: Secondary | ICD-10-CM

## 2023-04-08 DIAGNOSIS — D638 Anemia in other chronic diseases classified elsewhere: Secondary | ICD-10-CM | POA: Diagnosis not present

## 2023-04-08 DIAGNOSIS — R5381 Other malaise: Secondary | ICD-10-CM | POA: Diagnosis not present

## 2023-04-08 DIAGNOSIS — R531 Weakness: Secondary | ICD-10-CM | POA: Diagnosis not present

## 2023-04-08 LAB — CBC
HCT: 23.7 % — ABNORMAL LOW (ref 39.0–52.0)
Hemoglobin: 7.2 g/dL — ABNORMAL LOW (ref 13.0–17.0)
MCH: 23.1 pg — ABNORMAL LOW (ref 26.0–34.0)
MCHC: 30.4 g/dL (ref 30.0–36.0)
MCV: 76 fL — ABNORMAL LOW (ref 80.0–100.0)
Platelets: 163 10*3/uL (ref 150–400)
RBC: 3.12 MIL/uL — ABNORMAL LOW (ref 4.22–5.81)
RDW: 20.9 % — ABNORMAL HIGH (ref 11.5–15.5)
WBC: 7.3 10*3/uL (ref 4.0–10.5)
nRBC: 0 % (ref 0.0–0.2)

## 2023-04-08 LAB — BASIC METABOLIC PANEL
Anion gap: 15 (ref 5–15)
BUN: 151 mg/dL — ABNORMAL HIGH (ref 6–20)
CO2: 23 mmol/L (ref 22–32)
Calcium: 8.7 mg/dL — ABNORMAL LOW (ref 8.9–10.3)
Chloride: 99 mmol/L (ref 98–111)
Creatinine, Ser: 11.69 mg/dL — ABNORMAL HIGH (ref 0.61–1.24)
GFR, Estimated: 5 mL/min — ABNORMAL LOW (ref >60)
Glucose, Bld: 114 mg/dL — ABNORMAL HIGH (ref 70–99)
Potassium: 5.1 mmol/L (ref 3.5–5.1)
Sodium: 137 mmol/L (ref 135–145)

## 2023-04-08 LAB — SURGICAL PATHOLOGY

## 2023-04-08 MED ORDER — HEPARIN SODIUM (PORCINE) 1000 UNIT/ML IJ SOLN
INTRAMUSCULAR | Status: AC
  Administered 2023-04-08: 1500 [IU] via INTRAVENOUS
  Filled 2023-04-08: qty 4

## 2023-04-08 MED ORDER — LACTULOSE 10 GM/15ML PO SOLN
20.0000 g | Freq: Once | ORAL | Status: AC
  Administered 2023-04-08: 20 g via ORAL
  Filled 2023-04-08: qty 30

## 2023-04-08 MED ORDER — HYDROCERIN EX CREA
TOPICAL_CREAM | Freq: Two times a day (BID) | CUTANEOUS | Status: DC
  Filled 2023-04-08: qty 113

## 2023-04-08 MED ORDER — POLYETHYLENE GLYCOL 3350 17 G PO PACK
17.0000 g | PACK | Freq: Every day | ORAL | Status: DC | PRN
  Administered 2023-04-08: 17 g via ORAL
  Filled 2023-04-08: qty 1

## 2023-04-08 MED ORDER — CHLORHEXIDINE GLUCONATE CLOTH 2 % EX PADS
6.0000 | MEDICATED_PAD | Freq: Every day | CUTANEOUS | Status: DC
  Administered 2023-04-10 – 2023-04-13 (×4): 6 via TOPICAL

## 2023-04-08 NOTE — Progress Notes (Signed)
[facility]                                                                              [name], is a 53 y.o. male, DOB - [birth year], WJX:[id number] Admit date - 03/24/2023    Outpatient Primary MD for the patient is Tomasita Crumble T, FNP  LOS - 4  days  Chief Complaint  Patient presents with   Weakness       Brief summary   Patient is a 53 y.o. male with ESRD on HD TTS, recent right BKA ([name]), CAD, CVA, paroxysmal atrial fibrillation not on anticoagulation due to history of ICH, gastroparesis, DM type II, diabetic retinopathy, glaucoma, history of left eye enucleation who presented to [name], ED on 1/30 via EMS from dialysis unit with complaints of increased blurriness, weakness, cough and difficulty managing at home.  Patient states symptoms worse with dialysis.  Recently discharged from the hospital to SNF for 3 weeks and has been home over the last 10 days.  Patient reports is unable to take care of himself due to weakness and spouse working all the time.  Requesting placement.  MRA showed no intracranial large vessel occlusion or significant stenosis.  [name] discussed with ophthalmology on-call, [name] regarding his blurred vision in the setting of known diabetic retinopathy, glaucoma and history of left eye enucleation; ophthalmology recommended no other workup at this time and vision to be monitored and outpatient follow-up with his retina specialist.   [name] consulted for admission for further evaluation management of progressive weakness with TOC involvement for placement.  PT recommended SNF however insurance denied SNF authorization he is currently appealing the denial. He was transferred to [facility] for evaluation of the right BKA gangrene.  Plan for AKA on Wednesday   Assessment & Plan      Generalized weakness/deconditioning and gait disturbances -With recent prolonged hospitalization followed by subacute rehab after right BKA by [name] on  02/09/2023.   -Therapy evaluations recommended SNF however was denied by the insurance, patient is now appealing the denial.  Unable to care for himself at home, was briefly at home for 10 days after discharge from SNF.     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.  Recently seen in outpatient office visit 03/22/2023. -Dressing changes and wound care were recommended -Status post right above-knee amputation with application of Prevena wound VAC on 04/06/2023 by [name] -Continue pain control with oxycodone as needed for moderate pain, IV Dilaudid for severe pain as needed     Paroxysmal atrial fibrillation -Continue rate control with amiodarone, Coreg -Not on anticoagulation due to history of ICH    Diabetes mellitus type 2, with end-stage renal disease, peripheral vascular disease -CBGs currently controlled -Not on insulin   Essential hypertension -Continue Coreg, hydralazine IV as needed with parameters   Seizure disorder -Continue with Keppra 500 mg on Sunday Tuesday Thursday and Saturday and 750 mg daily on Monday Wednesday and Friday -No new seizures since admission.     Anemia  of chronic disease -Hemoglobin 7.2, recheck CBC in a.m., will transfuse if < 7. -Baseline hemoglobin between 7-8     ESRD on dialysis on TTS -Nephrology consulted, undergoing HD  -Receives outpatient dialysis at [facility] clinic      Hyperkalemia -Stable     Autonomic dysfunction Continue midodrine    Pressure injury documentation Right thigh unstageable, POA Left heel, lateral, stage II, POA Left heel medial, deep tissue injury, POA -Wound care per nursing     Estimated body mass index is 21.82 kg/m as calculated from the following:   Height as of this encounter: 6\' 3"  (1.905 m).   Weight as of this encounter: 79.2 kg.  Code Status: Full code DVT Prophylaxis:  SCD's  Start: 04/06/23 1738 SCDs Start: 03/24/23 2107   Level of Care: Level of care: Med-Surg Family Communication: Updated patient's wife at the bedside Disposition Plan:      Remains inpatient appropriate: Patient being screened for CIR   Procedures:    Consultants:   Orthopedics  Antimicrobials:   Anti-infectives (From admission, onward)    Start     Dose/Rate Route Frequency Ordered Stop   04/06/23 0600  ceFAZolin (ANCEF) IVPB 2g/100 mL premix        2 g 200 mL/hr over 30 Minutes Intravenous On call to O.R. 04/05/23 1855 04/06/23 1547          Medications  (feeding supplement) PROSource Plus  30 mL Oral TID BM   amiodarone  200 mg Oral Daily   vitamin C  1,000 mg Oral Daily   aspirin EC  81 mg Oral Daily   atorvastatin  80 mg Oral QHS   bisacodyl  10 mg Rectal Once   calcitRIOL  2.25 mcg Oral Q T,Th,Sat-1800   calcium acetate  1,334 mg Oral TID WC   carvedilol  12.5 mg Oral BID   Chlorhexidine Gluconate Cloth  6 each Topical Q0600   cinacalcet  60 mg Oral Q T,Th,Sat-1800   [START ON 04/12/2023] darbepoetin (ARANESP) injection - DIALYSIS  200 mcg Subcutaneous Q Tue-1800   dorzolamide-timolol  1 drop Right Eye BID   hydrocerin   Topical BID   leptospermum manuka honey  1 Application Topical Daily   levETIRAcetam  500 mg Oral Once per day on Sunday Monday Wednesday Friday   levETIRAcetam  750 mg Oral Once per day on Tuesday Thursday Saturday   lidocaine-prilocaine  1 Application Topical Once per day on Monday Wednesday Friday   multivitamin  1 tablet Oral QHS   nutrition supplement (JUVEN)  1 packet Oral BID BM   pneumococcal 20-valent conjugate vaccine  0.5 mL Intramuscular Tomorrow-1000   zinc sulfate (50mg  elemental zinc)  220 mg Oral Daily      Subjective:   [name] was seen and examined today.  Seen during HD, appears comfortable, no bleeding, nausea vomiting, chest pain or shortness of breath  Objective:   Vitals:   04/08/23 1000 04/08/23 1030  04/08/23 1100 04/08/23 1105  BP: 119/66 (!) 115/46 (!) 117/106 (!) 117/98  Pulse: (!) 58 (!) 57 (!) 57 (!) 58  Resp: 10 (!) 9 (!) 7 10  Temp:    98.5 F (36.9 C)  TempSrc:      SpO2: 100% 100% 100% 100%  Weight:    79.2 kg  Height:        Intake/Output Summary (Last 24 hours) at 04/08/2023 1445 Last data filed at 04/08/2023 1105 Gross per 24 hour  Intake --  Output 800 ml  Net -800 ml     Wt Readings from Last 3 Encounters:  04/08/23 79.2 kg  03/17/23 79.4 kg  02/28/23 80.4 kg    Physical Exam General: Alert and oriented x 3, NAD Cardiovascular: S1 S2 clear, RRR.  Respiratory: CTAB Gastrointestinal: Soft, nontender, nondistended, NBS Ext: right AKA, wound VAC in place  Neuro: no new deficits Skin: No rashes Psych: Normal affect       Data Reviewed:  I have personally reviewed following labs    CBC Lab Results  Component Value Date   WBC 7.3 04/08/2023   RBC 3.12 (L) 04/08/2023   HGB 7.2 (L) 04/08/2023   HCT 23.7 (L) 04/08/2023   MCV 76.0 (L) 04/08/2023   MCH 23.1 (L) 04/08/2023   PLT 163 04/08/2023   MCHC 30.4 04/08/2023   RDW 20.9 (H) 04/08/2023   LYMPHSABS 1.4 04/05/2023   MONOABS 0.8 04/05/2023   EOSABS 0.7 (H) 04/05/2023   BASOSABS 0.1 04/05/2023     Last metabolic panel Lab Results  Component Value Date   NA 137 04/08/2023   K 5.1 04/08/2023   CL 99 04/08/2023   CO2 23 04/08/2023   BUN 151 (H) 04/08/2023   CREATININE 11.69 (H) 04/08/2023   GLUCOSE 114 (H) 04/08/2023   GFRNONAA 5 (L) 04/08/2023   GFRAA 5 (L) 08/27/2019   CALCIUM 8.7 (L) 04/08/2023   PHOS 6.9 (H) 04/05/2023   PROT 6.4 (L) 03/24/2023   ALBUMIN 2.5 (L) 04/05/2023   LABGLOB 2.9 08/19/2016   AGRATIO 0.9 08/19/2016   BILITOT 0.7 03/24/2023   ALKPHOS 62 03/24/2023   AST 13 (L) 03/24/2023   ALT 14 03/24/2023   ANIONGAP 15 04/08/2023    CBG (last 3)  Recent Labs    04/06/23 1630  GLUCAP 75      Coagulation Profile: No results for input(s): "INR", "PROTIME" in  the last 168 hours.   Radiology Studies: I have personally reviewed the imaging studies  No results found.      Thad Ranger M.D. [facility] 04/08/2023, 2:45 PM  Available via Epic secure chat 7am-7pm After 7 pm, please refer to night coverage provider listed on amion.

## 2023-04-08 NOTE — Plan of Care (Signed)
  Problem: Clinical Measurements: Goal: Ability to maintain clinical measurements within normal limits will improve Outcome: Progressing Goal: Diagnostic test results will improve Outcome: Progressing Goal: Respiratory complications will improve Outcome: Progressing Goal: Cardiovascular complication will be avoided Outcome: Progressing   Problem: Activity: Goal: Risk for activity intolerance will decrease Outcome: Progressing   Problem: Nutrition: Goal: Adequate nutrition will be maintained Outcome: Progressing   Problem: Coping: Goal: Level of anxiety will decrease Outcome: Progressing   Problem: Elimination: Goal: Will not experience complications related to bowel motility Outcome: Progressing Goal: Will not experience complications related to urinary retention Outcome: Progressing   Problem: Pain Managment: Goal: General experience of comfort will improve and/or be controlled Outcome: Progressing   Problem: Safety: Goal: Ability to remain free from injury will improve Outcome: Progressing   Problem: Skin Integrity: Goal: Risk for impaired skin integrity will decrease Outcome: Progressing   Problem: Education: Goal: Knowledge of disease and its progression will improve Outcome: Progressing Goal: Individualized Educational Video(s) Outcome: Progressing   Problem: Fluid Volume: Goal: Compliance with measures to maintain balanced fluid volume will improve Outcome: Progressing   Problem: Health Behavior/Discharge Planning: Goal: Ability to manage health-related needs will improve Outcome: Progressing   Problem: Nutritional: Goal: Ability to make healthy dietary choices will improve Outcome: Progressing   Problem: Clinical Measurements: Goal: Complications related to the disease process, condition or treatment will be avoided or minimized Outcome: Progressing   Problem: Education: Goal: Knowledge of the prescribed therapeutic regimen will improve Outcome:  Progressing Goal: Ability to verbalize activity precautions or restrictions will improve Outcome: Progressing Goal: Understanding of discharge needs will improve Outcome: Progressing   Problem: Activity: Goal: Ability to perform//tolerate increased activity and mobilize with assistive devices will improve Outcome: Progressing   Problem: Clinical Measurements: Goal: Postoperative complications will be avoided or minimized Outcome: Progressing   Problem: Self-Care: Goal: Ability to meet self-care needs will improve Outcome: Progressing   Problem: Self-Concept: Goal: Ability to maintain and perform role responsibilities to the fullest extent possible will improve Outcome: Progressing   Problem: Pain Management: Goal: Pain level will decrease with appropriate interventions Outcome: Progressing   Problem: Skin Integrity: Goal: Demonstration of wound healing without infection will improve Outcome: Progressing

## 2023-04-08 NOTE — Progress Notes (Signed)
Physical Therapy Treatment Patient Details Name: [name]. MRN: [id number] DOB: Apr 28, 1970 Today's Date: 04/08/2023   History of Present Illness The pt is a 53 yo male presenting 1/30 from HD with blurriness and weakness since BKA on 02/09/23. Admitted to [facility] for [facility]TT, inability to manage at home, transferred to [facility] 2/6 for surgical debridement. 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 PMH includes:  ESRD on HD TTS, recent right BKA ([name]), CAD, CVA, paroxysmal atrial fibrillation not on anticoagulation due to history of ICH, gastroparesis, type 2 diabetes mellitus, diabetic retinopathy, glaucoma, and history of left eye enucleation.    PT Comments  The pt was agreeable to session, demos improvement in bed mobility and use of core and UE to complete sidelying to sitting transfer this session. He continues to demo deficits in core strength, balance, and UE strength to maintain seated balance without assistance or complete hip lift from bed (as is needed for lateral scooting). The pt however, demos significantly improved effort with exercises and better pain tolerance with mobility this session. He required modA for bed mobility and mod-maxA to complete lateral scooting along EOB at this time. Session focused on seated balance and UE strengthening in preparation for continued transfer training. Continue to recommend intensive therapies to maximize pt independence with transfer to allow for return home.     If plan is discharge home, recommend the following: A lot of help with bathing/dressing/bathroom;A lot of help with walking and/or transfers;Assistance with cooking/housework;Help with stairs or ramp for entrance   Can travel by private vehicle     No  Equipment Recommendations  Wheelchair (measurements PT);Wheelchair cushion (measurements PT);Hoyer lift    Recommendations for Other Services       Precautions / Restrictions Precautions Precautions: Fall Recall of Precautions/Restrictions:  Intact Precaution/Restrictions Comments: R AKA with wound vac Restrictions Weight Bearing Restrictions Per Provider Order: No     Mobility  Bed Mobility Overal bed mobility: Needs Assistance Bed Mobility: Rolling, Sit to Supine, Sidelying to Sit Rolling: Used rails, Min assist Sidelying to sit: Mod assist, Used rails   Sit to supine: Min assist, +2 for safety/equipment, HOB elevated   General bed mobility comments: modA of 1 to complete transfer to sitting EOB, assist to scoot hips forwards. once sitting, pt able to slowly complete repeated chair push ups toscoot hips forwards but is limited by pain. poor ability to use LLE toadvance    Transfers Overall transfer level: Needs assistance   Transfers: Bed to chair/wheelchair/BSC            Lateral/Scoot Transfers: +2 physical assistance, +2 safety/equipment, Max assist, Mod assist General transfer comment: pt declined OOB to chair, worked on repeated cahir push ups with BUE on bed to increase hip clearance for scooting. completed x5 stationary chair pushups then x5 lateral scoots along EOB with mod-mxA of 2 (pt completing 25-50%)    Ambulation/Gait               General Gait Details: pt unable   Stairs             Wheelchair Mobility     Tilt Bed    Modified Rankin (Stroke Patients Only)       Balance Overall balance assessment: Needs assistance Sitting-balance support: Feet supported, Bilateral upper extremity supported Sitting balance-Leahy Scale: Fair Sitting balance - Comments: poor/fair as pt initially with posterior LOB without support, to CGA at times but easily loses balance with movement of LE or UE. improved from last session  with pt able to correct more himself and needing only minA to correct Postural control: Posterior lean, Left lateral lean   Standing balance-Leahy Scale: Zero                              Communication Communication Communication: No apparent  difficulties  Cognition Arousal: Alert Behavior During Therapy: WFL for tasks assessed/performed, Agitated   PT - Cognitive impairments: No apparent impairments                       PT - Cognition Comments: impacted by pain, wife is a good motivator Following commands: Intact      Cueing Cueing Techniques: Verbal cues, Tactile cues  Exercises General Exercises - Lower Extremity Long Arc Quad: Left, 5 reps, Seated, Strengthening (against min resistance) Other Exercises Other Exercises: x5 seated row (against therapist assist) Other Exercises: x5 seated chest press (against therapist assist) Other Exercises: x5 stationary chair push up with 2 sec hold Other Exercises: x6 lateral scoot along EOB    General Comments        Pertinent Vitals/Pain Pain Assessment Pain Assessment: 0-10 Pain Score: 8  Faces Pain Scale: Hurts whole lot Pain Location: 8-9 for RLE, 6/10 for L heel Pain Descriptors / Indicators: Discomfort, Sore, Sharp, Throbbing Pain Intervention(s): Limited activity within patient's tolerance, Monitored during session, Premedicated before session, Repositioned    Home Living                          Prior Function            PT Goals (current goals can now be found in the care plan section) Acute Rehab PT Goals Patient Stated Goal: to be able to complete lateral transfer PT Goal Formulation: With patient Time For Goal Achievement: 04/21/23 Potential to Achieve Goals: Fair Progress towards PT goals: Progressing toward goals    Frequency    Min 3X/week      PT Plan      Co-evaluation              AM-PAC PT "6 Clicks" Mobility   Outcome Measure  Help needed turning from your back to your side while in a flat bed without using bedrails?: A Little Help needed moving from lying on your back to sitting on the side of a flat bed without using bedrails?: A Lot Help needed moving to and from a bed to a chair (including a  wheelchair)?: Total Help needed standing up from a chair using your arms (e.g., wheelchair or bedside chair)?: Total Help needed to walk in hospital room?: Total Help needed climbing 3-5 steps with a railing? : Total 6 Click Score: 9    End of Session   Activity Tolerance: Patient limited by fatigue;Patient limited by pain Patient left: with call bell/phone within reach;in bed;with bed alarm set;with family/visitor present Nurse Communication: Mobility status;Need for lift equipment PT Visit Diagnosis: Unsteadiness on feet (R26.81);Other abnormalities of gait and mobility (R26.89);Muscle weakness (generalized) (M62.81)     Time: 3086-5784 PT Time Calculation (min) (ACUTE ONLY): 34 min  Charges:    $Therapeutic Exercise: 8-22 mins $Therapeutic Activity: 8-22 mins PT General Charges $$ ACUTE PT VISIT: 1 Visit                     Vickki Muff, PT, DPT   Acute Rehabilitation Department Office [phone] Secure  Chat Communication Preferred   Ronnie Derby 04/08/2023, 3:49 PM

## 2023-04-08 NOTE — Progress Notes (Signed)
[facility] Progress Note   Subjective:    Seen and examined on HD. S/p R AKA from BKA by [name] 2/12. Tolerating UFG at 1L. BP is 99/34. He denies any acute issues and resting comfortably. Noted inpatient rehab was consulted.  Objective Vitals:   04/08/23 0518 04/08/23 0819 04/08/23 0845 04/08/23 0900  BP: (!) 111/50 (!) 105/33 (!) 105/42 (!) 99/34  Pulse: 60 (!) 59 (!) 58 (!) 57  Resp: 18 10 (!) 7 11  Temp: 98.1 F (36.7 C) 98.8 F (37.1 C)    TempSrc:      SpO2: 100% 100% 98% 99%  Weight:  80 kg    Height:       Physical Exam General: Alert chronically ill male NAD Heart: RRR no MRG Lungs: Clear anteriorly; nonlabored breathing Abdomen: Soft and non-tender Extremities: No pedal edema, R AKA-wound vac in place Dialysis Access:  LU A  AVG + bruit  Filed Weights   04/05/23 2243 04/06/23 0130 04/08/23 0819  Weight: 79.7 kg 80 kg 80 kg   No intake or output data in the 24 hours ending 04/08/23 0931  Additional Objective Labs: Basic Metabolic Panel: Recent Labs  Lab 04/04/23 0356 04/04/23 1134 04/05/23 0352 04/06/23 1433 04/07/23 1159  NA 139  --  142 134* 135  K 5.2*   < > 5.2* 4.5 4.6  CL 96*  --  103 97* 96*  CO2 25  --  24  --  21*  GLUCOSE 96  --  75 80 109*  BUN 115*  --  139* 105* 123*  CREATININE 9.32*  --  10.47* 10.10* 10.62*  CALCIUM 9.1  --  9.0  --  8.5*  PHOS 6.6*  --  6.9*  --   --    < > = values in this interval not displayed.   Liver Function Tests: Recent Labs  Lab 04/04/23 0356 04/05/23 0352  ALBUMIN 2.5* 2.5*   No results for input(s): "LIPASE", "AMYLASE" in the last 168 hours. CBC: Recent Labs  Lab 04/02/23 1438 04/03/23 0955 04/04/23 0356 04/05/23 0352 04/06/23 1238 04/06/23 1433 04/07/23 1159 04/08/23 0500  WBC 7.8 6.6 7.8 6.9 8.1  --  8.8 7.3  NEUTROABS 4.5 3.4  --  3.9  --   --   --   --   HGB 7.8* 7.1* 8.3* 8.0* 7.8* 8.8* 7.9* 7.2*  HCT 26.5* 24.4* 27.6* 26.5* 25.5* 26.0* 26.1* 23.7*  MCV 75.5*  74.8* 75.6* 75.5* 74.6*  --  75.0* 76.0*  PLT 187 179 187 180 182  --  172 163   Blood Culture    Component Value Date/Time   SDES TISSUE 02/04/2023 1206   SDES WOUND 02/04/2023 1206   SPECREQUEST RIGHT FOOT 02/04/2023 1206   SPECREQUEST RIGHT FOOT 02/04/2023 1206   CULT  02/04/2023 1206    ABUNDANT STAPHYLOCOCCUS AUREUS MODERATE CORYNEBACTERIUM STRIATUM Standardized susceptibility testing for this organism is not available. NO ANAEROBES ISOLATED Performed at [facility], [address], [place], [place] [address]    CULT  02/04/2023 1206    ABUNDANT STAPHYLOCOCCUS AUREUS SUSCEPTIBILITIES PERFORMED ON PREVIOUS CULTURE WITHIN THE LAST 5 DAYS. MODERATE CORYNEBACTERIUM STRIATUM Standardized susceptibility testing for this organism is not available. NO ANAEROBES ISOLATED Performed at [facility], [address] [place], [place] [address]    REPTSTATUS 02/09/2023 FINAL 02/04/2023 1206   REPTSTATUS 02/09/2023 FINAL 02/04/2023 1206    Cardiac Enzymes: No results for input(s): "CKTOTAL", "CKMB", "CKMBINDEX", "TROPONINI"  in the last 168 hours. CBG: Recent Labs  Lab 04/03/23 0731 04/06/23 1630  GLUCAP 75 75   Iron Studies: No results for input(s): "IRON", "TIBC", "TRANSFERRIN", "FERRITIN" in the last 72 hours. Lab Results  Component Value Date   INR 1.1 03/01/2022   INR 1.6 (H) 11/21/2021   INR 1.3 (H) 08/12/2021   Studies/Results: No results found.  Medications:  sodium chloride      (feeding supplement) PROSource Plus  30 mL Oral TID BM   amiodarone  200 mg Oral Daily   vitamin C  1,000 mg Oral Daily   aspirin EC  81 mg Oral Daily   atorvastatin  80 mg Oral QHS   bisacodyl  10 mg Rectal Once   calcitRIOL  2.25 mcg Oral Q T,Th,Sat-1800   calcium acetate  1,334 mg Oral TID WC   carvedilol  12.5 mg Oral BID   Chlorhexidine Gluconate Cloth  6 each Topical Q0600   cinacalcet  60 mg Oral Q T,Th,Sat-1800   [START ON 04/12/2023] darbepoetin (ARANESP)  injection - DIALYSIS  200 mcg Subcutaneous Q Tue-1800   dorzolamide-timolol  1 drop Right Eye BID   heparin sodium (porcine)       heparin sodium (porcine)  1,500 Units Intravenous Once   leptospermum manuka honey  1 Application Topical Daily   levETIRAcetam  500 mg Oral Once per day on Sunday Monday Wednesday Friday   levETIRAcetam  750 mg Oral Once per day on Tuesday Thursday Saturday   lidocaine-prilocaine  1 Application Topical Once per day on Monday Wednesday Friday   multivitamin  1 tablet Oral QHS   nutrition supplement (JUVEN)  1 packet Oral BID BM   pneumococcal 20-valent conjugate vaccine  0.5 mL Intramuscular Tomorrow-1000   zinc sulfate (50mg  elemental zinc)  220 mg Oral Daily    Dialysis Orders: [facility] TTS 3h  72kg  B400  LUA AVF  Heparin 1000+ 400 u/hr  Assessment/Plan: Right BKA with painful gangrenous wound: S/p right AKA from BKA 2/12 by [name]. Antibiotics per admit team/[name] ESRD -HD TTS. Unable to dialyze yesterday d/t high patient census so on HD now. Plan for HD again tomorrow (3hrs) to get back on schedule. Anticipate his EDW will need to be adjusted after his procedure. General Weakness/debility: secondary #1 patient family unable to care for patient. Inpatient rehab consulted yesterday. SNF recommended Hyperkalemia: I advised following the proper diet and limiting outside foods to keep his K+ under control. Follow trend closely. May need to consider Lokelma but will hold off for now. K+ currently stable, awaiting new lab results HTN/volume: no excess volume on exam . BP okay. On midodrine 5 mg 3 times daily.  Anemia: Hgb trending down which I expect given recent procedure. Not reported outpatient ESA. Raised Aranesp to weekly. Transfuse for Hgb < 7 if needed given recent procedure. Secondary hyperparathyroidism: corrected calcium okay, phosphorus 5.2, on calcitriol, Sensipar, and calcium acetate as binder Atrial fibrillation: on  amiodarone and Coreg DM T2= per admit  Salome Holmes, NP [facility] 04/08/2023,9:31 AM  LOS: 4 days

## 2023-04-08 NOTE — Progress Notes (Signed)
   04/08/23 1105  Vitals  Temp 98.5 F (36.9 C)  Pulse Rate (!) 58  Resp 10  BP (!) 117/98  SpO2 100 %  O2 Device Room Air  Weight 79.2 kg  Type of Weight Post-Dialysis  Oxygen Therapy  Patient Activity (if Appropriate) In bed  Pulse Oximetry Type Continuous  Oximetry Probe Site Changed No  During Treatment Monitoring  HD Safety Checks Performed Yes  Intra-Hemodialysis Comments Tx completed   Received patient in bed to unit.  Alert and oriented.  Informed consent signed and in chart.   TX duration: 2.5 hours  Patient tolerated well.  Transported back to the room  Alert, without acute distress.  Hand-off given to patient's nurse.   Access used: LAVF Access issues: None  Total UF removed: 800 Medication(s) given: Midodrine   Mar Daring, LPN  [facility]

## 2023-04-09 DIAGNOSIS — T8743 Infection of amputation stump, right lower extremity: Secondary | ICD-10-CM

## 2023-04-09 DIAGNOSIS — R531 Weakness: Secondary | ICD-10-CM | POA: Diagnosis not present

## 2023-04-09 DIAGNOSIS — R5381 Other malaise: Secondary | ICD-10-CM | POA: Diagnosis not present

## 2023-04-09 DIAGNOSIS — D638 Anemia in other chronic diseases classified elsewhere: Secondary | ICD-10-CM | POA: Diagnosis not present

## 2023-04-09 DIAGNOSIS — H538 Other visual disturbances: Secondary | ICD-10-CM | POA: Diagnosis not present

## 2023-04-09 LAB — BASIC METABOLIC PANEL
Anion gap: 13 (ref 5–15)
BUN: 101 mg/dL — ABNORMAL HIGH (ref 6–20)
CO2: 25 mmol/L (ref 22–32)
Calcium: 9.1 mg/dL (ref 8.9–10.3)
Chloride: 99 mmol/L (ref 98–111)
Creatinine, Ser: 8.82 mg/dL — ABNORMAL HIGH (ref 0.61–1.24)
GFR, Estimated: 7 mL/min — ABNORMAL LOW (ref >60)
Glucose, Bld: 88 mg/dL (ref 70–99)
Potassium: 5 mmol/L (ref 3.5–5.1)
Sodium: 137 mmol/L (ref 135–145)

## 2023-04-09 LAB — CBC
HCT: 26.7 % — ABNORMAL LOW (ref 39.0–52.0)
Hemoglobin: 8.1 g/dL — ABNORMAL LOW (ref 13.0–17.0)
MCH: 22.8 pg — ABNORMAL LOW (ref 26.0–34.0)
MCHC: 30.3 g/dL (ref 30.0–36.0)
MCV: 75 fL — ABNORMAL LOW (ref 80.0–100.0)
Platelets: 161 10*3/uL (ref 150–400)
RBC: 3.56 MIL/uL — ABNORMAL LOW (ref 4.22–5.81)
RDW: 21.2 % — ABNORMAL HIGH (ref 11.5–15.5)
WBC: 8.3 10*3/uL (ref 4.0–10.5)
nRBC: 0 % (ref 0.0–0.2)

## 2023-04-09 MED ORDER — HYDROMORPHONE HCL 1 MG/ML IJ SOLN: 1.0000 mg | INTRAMUSCULAR | Status: DC | PRN

## 2023-04-09 MED ORDER — OXYCODONE HCL 5 MG PO TABS
10.0000 mg | ORAL_TABLET | ORAL | Status: DC | PRN
  Administered 2023-04-10 – 2023-04-11 (×3): 10 mg via ORAL
  Filled 2023-04-09 (×3): qty 2

## 2023-04-09 MED ORDER — POLYETHYLENE GLYCOL 3350 17 G PO PACK
17.0000 g | PACK | Freq: Every day | ORAL | Status: DC
  Administered 2023-04-09 – 2023-04-10 (×2): 17 g via ORAL
  Filled 2023-04-09 (×2): qty 1

## 2023-04-09 MED ORDER — SMOG ENEMA
960.0000 mL | Freq: Once | RECTAL | Status: DC
  Filled 2023-04-09: qty 960

## 2023-04-09 NOTE — Progress Notes (Signed)
Patient ID: [name] Barns., male   DOB: 1970-12-04, 53 y.o.   MRN: [id number] Patient has no drainage in the wound VAC canister.  Patient states he still has pain from the wound VAC dressing.  Patient also complains of pain from the [facility] and is not wearing it.  He has cubitus ulcer on the left heel.  Will remove the wound VAC dressing today.  Discussed the importance of floating the left heel so he is not putting pressure on the ischemic ulcer.

## 2023-04-09 NOTE — Progress Notes (Signed)
[facility]                                                                              [name], is a 53 y.o. male, DOB - Jul 31, 1970, ZOX:[id number] Admit date - 03/24/2023    Outpatient Primary MD for the patient is [name] Crumble T, FNP  LOS - 5  days  Chief Complaint  Patient presents with   Weakness       Brief summary   Patient is a 53 y.o. male with ESRD on HD TTS, recent right BKA ([name]), CAD, CVA, paroxysmal atrial fibrillation not on anticoagulation due to history of ICH, gastroparesis, DM type II, diabetic retinopathy, glaucoma, history of left eye enucleation who presented to [name], ED on 1/30 via EMS from dialysis unit with complaints of increased blurriness, weakness, cough and difficulty managing at home.  Patient states symptoms worse with dialysis.  Recently discharged from the hospital to SNF for 3 weeks and has been home over the last 10 days.  Patient reports is unable to take care of himself due to weakness and spouse working all the time.  Requesting placement.  MRA showed no intracranial large vessel occlusion or significant stenosis.  [name] discussed with ophthalmology on-call, [name] regarding his blurred vision in the setting of known diabetic retinopathy, glaucoma and history of left eye enucleation; ophthalmology recommended no other workup at this time and vision to be monitored and outpatient follow-up with his retina specialist.   [name] consulted for admission for further evaluation management of progressive weakness with TOC involvement for placement.  PT recommended SNF however insurance denied SNF authorization he is currently appealing the denial. He was transferred to [facility] for evaluation of the right BKA gangrene.  Plan for AKA on Wednesday   Assessment & Plan      Generalized weakness/deconditioning and gait disturbances -With recent prolonged hospitalization followed by subacute rehab after right BKA by [name] on  02/09/2023.   -Therapy evaluations recommended SNF however was denied by the insurance, patient is now appealing the denial.  Unable to care for himself at home, was briefly at home for 10 days after discharge from SNF.  Now has new right AKA since 04/06/2023 with wound VAC.     Gangrene of the right foot status post BKA in the setting of peripheral vascular disease:  -Patient with recent BKA February 09, 2023 by [name].  Recently seen in outpatient office visit 03/22/2023. -Dressing changes and wound care were recommended -S/p right above-knee amputation with application of Prevena wound VAC on 04/06/2023 by [name] - continues to complain of significant pain, states not well-controlled.  Increased oxycodone to 10 mg every 4 hours as needed for moderate pain, Dilaudid 1 to 2 mg every 3 hours as needed for severe pain.     Paroxysmal atrial fibrillation -Continue rate control with amiodarone, Coreg -Not on anticoagulation due to history of ICH    Diabetes mellitus type 2, with end-stage renal disease, peripheral vascular disease -CBGs currently controlled -Not on insulin   Essential hypertension -Continue Coreg, hydralazine IV as needed with parameters   Seizure disorder -  Continue with Keppra 500 mg on Sunday Tuesday Thursday and Saturday and 750 mg daily on Monday Wednesday and Friday -No new seizures since admission.     Anemia of chronic disease -Baseline hemoglobin between 7-8 -Hemoglobin 8.1     ESRD on dialysis on TTS -Nephrology consulted, undergoing HD  -Receives outpatient dialysis at [facility] clinic      Hyperkalemia -Stable     Autonomic dysfunction Continue midodrine    Pressure injury documentation Right thigh unstageable, POA Left heel, lateral, stage II, POA Left heel medial, deep tissue injury, POA -Wound care per nursing     Estimated body mass index is 21.82 kg/m as calculated from the following:   Height as of this encounter: 6\' 3"   (1.905 m).   Weight as of this encounter: 79.2 kg.  Code Status: Full code DVT Prophylaxis:  SCD's Start: 04/06/23 1738 SCDs Start: 03/24/23 2107   Level of Care: Level of care: Med-Surg Family Communication: Updated patient's wife at the bedside Disposition Plan:      Remains inpatient appropriate: Patient being screened for CIR   Procedures:    Consultants:   Orthopedics  Antimicrobials:   Anti-infectives (From admission, onward)    Start     Dose/Rate Route Frequency Ordered Stop   04/06/23 0600  ceFAZolin (ANCEF) IVPB 2g/100 mL premix        2 g 200 mL/hr over 30 Minutes Intravenous On call to O.R. 04/05/23 1855 04/06/23 1547          Medications  (feeding supplement) PROSource Plus  30 mL Oral TID BM   amiodarone  200 mg Oral Daily   vitamin C  1,000 mg Oral Daily   aspirin EC  81 mg Oral Daily   atorvastatin  80 mg Oral QHS   bisacodyl  10 mg Rectal Once   calcitRIOL  2.25 mcg Oral Q Macdonald,Th,Sat-1800   calcium acetate  1,334 mg Oral TID WC   carvedilol  12.5 mg Oral BID   Chlorhexidine Gluconate Cloth  6 each Topical Q0600   cinacalcet  60 mg Oral Q Macdonald,Th,Sat-1800   [START ON 04/12/2023] darbepoetin (ARANESP) injection - DIALYSIS  200 mcg Subcutaneous Q Tue-1800   dorzolamide-timolol  1 drop Right Eye BID   hydrocerin   Topical BID   leptospermum manuka honey  1 Application Topical Daily   levETIRAcetam  500 mg Oral Once per day on Sunday Monday Wednesday Friday   levETIRAcetam  750 mg Oral Once per day on Tuesday Thursday Saturday   lidocaine-prilocaine  1 Application Topical Once per day on Monday Wednesday Friday   multivitamin  1 tablet Oral QHS   nutrition supplement (JUVEN)  1 packet Oral BID BM   pneumococcal 20-valent conjugate vaccine  0.5 mL Intramuscular Tomorrow-1000   polyethylene glycol  17 g Oral Daily   SMOG  960 mL Rectal Once   zinc sulfate (50mg  elemental zinc)  220 mg Oral Daily      Subjective:   [name] was seen and  examined today.  Complaining of pain in the leg, uncontrolled.  No nausea vomiting, chest pain or shortness of breath.  Objective:   Vitals:   04/08/23 1105 04/08/23 1539 04/08/23 2113 04/09/23 0850  BP: (!) 117/98 (!) 162/75 (!) 118/57 (!) 173/71  Pulse: (!) 58 (!) 57 62 (!) 59  Resp: 10 16 18 18   Temp: 98.5 F (36.9 C) 98 F (36.7 C) 98.6 F (37 C) 98.5 F (36.9 C)  TempSrc:  Oral Oral Oral  SpO2: 100% 100% 100% 100%  Weight: 79.2 kg     Height:       No intake or output data in the 24 hours ending 04/09/23 1200    Wt Readings from Last 3 Encounters:  04/08/23 79.2 kg  03/17/23 79.4 kg  02/28/23 80.4 kg   Physical Exam General: Alert and oriented x 3, NAD Cardiovascular: S1 S2 clear, RRR.  Respiratory: CTAB, no wheezing Gastrointestinal: Soft, nontender, nondistended, NBS Ext: right AKA, wound VAC in place Neuro: no new deficits Psych: Normal affect     Data Reviewed:  I have personally reviewed following labs    CBC Lab Results  Component Value Date   WBC 8.3 04/09/2023   RBC 3.56 (L) 04/09/2023   HGB 8.1 (L) 04/09/2023   HCT 26.7 (L) 04/09/2023   MCV 75.0 (L) 04/09/2023   MCH 22.8 (L) 04/09/2023   PLT 161 04/09/2023   MCHC 30.3 04/09/2023   RDW 21.2 (H) 04/09/2023   LYMPHSABS 1.4 04/05/2023   MONOABS 0.8 04/05/2023   EOSABS 0.7 (H) 04/05/2023   BASOSABS 0.1 04/05/2023     Last metabolic panel Lab Results  Component Value Date   NA 137 04/09/2023   K 5.0 04/09/2023   CL 99 04/09/2023   CO2 25 04/09/2023   BUN 101 (H) 04/09/2023   CREATININE 8.82 (H) 04/09/2023   GLUCOSE 88 04/09/2023   GFRNONAA 7 (L) 04/09/2023   GFRAA 5 (L) 08/27/2019   CALCIUM 9.1 04/09/2023   PHOS 6.9 (H) 04/05/2023   PROT 6.4 (L) 03/24/2023   ALBUMIN 2.5 (L) 04/05/2023   LABGLOB 2.9 08/19/2016   AGRATIO 0.9 08/19/2016   BILITOT 0.7 03/24/2023   ALKPHOS 62 03/24/2023   AST 13 (L) 03/24/2023   ALT 14 03/24/2023   ANIONGAP 13 04/09/2023    CBG (last 3)  Recent  Labs    04/06/23 1630  GLUCAP 75      Coagulation Profile: No results for input(s): "INR", "PROTIME" in the last 168 hours.   Radiology Studies: I have personally reviewed the imaging studies  No results found.      Thad Ranger M.D. [facility] 04/09/2023, 12:00 PM  Available via Epic secure chat 7am-7pm After 7 pm, please refer to night coverage provider listed on amion.

## 2023-04-09 NOTE — Progress Notes (Signed)
[facility] Progress Note   Subjective:    Seen and examined patient at bedside. Tolerated yesterday's HD with minimal UF. He will receive a short HD today (2hrs) to get back on his routine schedule. Ortho removed wound vac and dressing remains CDI.  Objective Vitals:   04/08/23 1105 04/08/23 1539 04/08/23 2113 04/09/23 0850  BP: (!) 117/98 (!) 162/75 (!) 118/57 (!) 173/71  Pulse: (!) 58 (!) 57 62 (!) 59  Resp: 10 16 18 18   Temp: 98.5 F (36.9 C) 98 F (36.7 C) 98.6 F (37 C) 98.5 F (36.9 C)  TempSrc:  Oral Oral Oral  SpO2: 100% 100% 100% 100%  Weight: 79.2 kg     Height:       Physical Exam  General: Alert chronically ill male NAD Heart: RRR no MRG Lungs: Clear anteriorly; nonlabored breathing Abdomen: Soft and non-tender Extremities: No pedal edema, R AKA-wound vac removed, dressing CDI Dialysis Access:  LU A  AVG + bruit  Filed Weights   04/06/23 0130 04/08/23 0819 04/08/23 1105  Weight: 80 kg 80 kg 79.2 kg   No intake or output data in the 24 hours ending 04/09/23 1520  Additional Objective Labs: Basic Metabolic Panel: Recent Labs  Lab 04/04/23 0356 04/04/23 1134 04/05/23 0352 04/06/23 1433 04/07/23 1159 04/08/23 0500 04/09/23 0955  NA 139  --  142   < > 135 137 137  K 5.2*   < > 5.2*   < > 4.6 5.1 5.0  CL 96*  --  103   < > 96* 99 99  CO2 25  --  24  --  21* 23 25  GLUCOSE 96  --  75   < > 109* 114* 88  BUN 115*  --  139*   < > 123* 151* 101*  CREATININE 9.32*  --  10.47*   < > 10.62* 11.69* 8.82*  CALCIUM 9.1  --  9.0  --  8.5* 8.7* 9.1  PHOS 6.6*  --  6.9*  --   --   --   --    < > = values in this interval not displayed.   Liver Function Tests: Recent Labs  Lab 04/04/23 0356 04/05/23 0352  ALBUMIN 2.5* 2.5*   No results for input(s): "LIPASE", "AMYLASE" in the last 168 hours. CBC: Recent Labs  Lab 04/03/23 0955 04/04/23 0356 04/05/23 0352 04/06/23 1238 04/06/23 1433 04/07/23 1159 04/08/23 0500 04/09/23 0955  WBC  6.6   < > 6.9 8.1  --  8.8 7.3 8.3  NEUTROABS 3.4  --  3.9  --   --   --   --   --   HGB 7.1*   < > 8.0* 7.8*   < > 7.9* 7.2* 8.1*  HCT 24.4*   < > 26.5* 25.5*   < > 26.1* 23.7* 26.7*  MCV 74.8*   < > 75.5* 74.6*  --  75.0* 76.0* 75.0*  PLT 179   < > 180 182  --  172 163 161   < > = values in this interval not displayed.   Blood Culture    Component Value Date/Time   SDES TISSUE 02/04/2023 1206   SDES WOUND 02/04/2023 1206   SPECREQUEST RIGHT FOOT 02/04/2023 1206   SPECREQUEST RIGHT FOOT 02/04/2023 1206   CULT  02/04/2023 1206    ABUNDANT STAPHYLOCOCCUS AUREUS MODERATE CORYNEBACTERIUM STRIATUM Standardized susceptibility testing for this organism is not available. NO ANAEROBES ISOLATED Performed at [facility],  [address] [place], [place] [address]    CULT  02/04/2023 1206    ABUNDANT STAPHYLOCOCCUS AUREUS SUSCEPTIBILITIES PERFORMED ON PREVIOUS CULTURE WITHIN THE LAST 5 DAYS. MODERATE CORYNEBACTERIUM STRIATUM Standardized susceptibility testing for this organism is not available. NO ANAEROBES ISOLATED Performed at [facility], [address], [place], [place] [address]    REPTSTATUS 02/09/2023 FINAL 02/04/2023 1206   REPTSTATUS 02/09/2023 FINAL 02/04/2023 1206    Cardiac Enzymes: No results for input(s): "CKTOTAL", "CKMB", "CKMBINDEX", "TROPONINI" in the last 168 hours. CBG: Recent Labs  Lab 04/03/23 0731 04/06/23 1630  GLUCAP 75 75   Iron Studies: No results for input(s): "IRON", "TIBC", "TRANSFERRIN", "FERRITIN" in the last 72 hours. Lab Results  Component Value Date   INR 1.1 03/01/2022   INR 1.6 (H) 11/21/2021   INR 1.3 (H) 08/12/2021   Studies/Results: No results found.  Medications:  sodium chloride      (feeding supplement) PROSource Plus  30 mL Oral TID BM   amiodarone  200 mg Oral Daily   vitamin C  1,000 mg Oral Daily   aspirin EC  81 mg Oral Daily   atorvastatin  80 mg Oral QHS   bisacodyl  10 mg Rectal Once   calcitRIOL   2.25 mcg Oral Q T,Th,Sat-1800   calcium acetate  1,334 mg Oral TID WC   carvedilol  12.5 mg Oral BID   Chlorhexidine Gluconate Cloth  6 each Topical Q0600   cinacalcet  60 mg Oral Q T,Th,Sat-1800   [START ON 04/12/2023] darbepoetin (ARANESP) injection - DIALYSIS  200 mcg Subcutaneous Q Tue-1800   dorzolamide-timolol  1 drop Right Eye BID   hydrocerin   Topical BID   leptospermum manuka honey  1 Application Topical Daily   levETIRAcetam  500 mg Oral Once per day on Sunday Monday Wednesday Friday   levETIRAcetam  750 mg Oral Once per day on Tuesday Thursday Saturday   lidocaine-prilocaine  1 Application Topical Once per day on Monday Wednesday Friday   multivitamin  1 tablet Oral QHS   nutrition supplement (JUVEN)  1 packet Oral BID BM   pneumococcal 20-valent conjugate vaccine  0.5 mL Intramuscular Tomorrow-1000   polyethylene glycol  17 g Oral Daily   SMOG  960 mL Rectal Once   zinc sulfate (50mg  elemental zinc)  220 mg Oral Daily    Dialysis Orders: [facility] TTS 3h  72kg  B400  LUA AVF  Heparin 1000+ 400 u/hr  Assessment/Plan: Right BKA with painful gangrenous wound: S/p right AKA from BKA 2/12 by [name]. Antibiotics per admit team/[name] ESRD -HD TTS. Plan for short (2hrs) HD today to get back on schedule. Anticipate his EDW will need to be adjusted after his procedure. General Weakness/debility: secondary #1 patient family unable to care for patient. Inpatient rehab consulted yesterday. SNF recommended Hyperkalemia: I advised following the proper diet and limiting outside foods to keep his K+ under control. Follow trend closely. May need to consider Lokelma but will hold off for now. K+ currently stable, awaiting new lab results HTN/volume: no excess volume on exam . BP okay. On midodrine 5 mg 3 times daily.  Anemia: Hgb now 8.1. On Aranesp to weekly (recently raised). Transfuse for Hgb < 7 if needed given recent procedure. Secondary hyperparathyroidism:  corrected calcium okay, phosphorus 5.2, on calcitriol, Sensipar, and calcium acetate as binder Atrial fibrillation: on amiodarone and Coreg DM T2= per admit  Salome Holmes, NP [facility] 04/09/2023,3:20 PM  LOS: 5 days

## 2023-04-09 NOTE — Plan of Care (Signed)
  Problem: Clinical Measurements: Goal: Ability to maintain clinical measurements within normal limits will improve Outcome: Progressing Goal: Diagnostic test results will improve Outcome: Progressing Goal: Respiratory complications will improve Outcome: Progressing Goal: Cardiovascular complication will be avoided Outcome: Progressing   Problem: Activity: Goal: Risk for activity intolerance will decrease Outcome: Progressing   Problem: Nutrition: Goal: Adequate nutrition will be maintained Outcome: Progressing   Problem: Coping: Goal: Level of anxiety will decrease Outcome: Progressing   Problem: Elimination: Goal: Will not experience complications related to bowel motility Outcome: Progressing Goal: Will not experience complications related to urinary retention Outcome: Progressing   Problem: Pain Managment: Goal: General experience of comfort will improve and/or be controlled Outcome: Progressing   Problem: Safety: Goal: Ability to remain free from injury will improve Outcome: Progressing   Problem: Skin Integrity: Goal: Risk for impaired skin integrity will decrease Outcome: Progressing   Problem: Education: Goal: Knowledge of disease and its progression will improve Outcome: Progressing Goal: Individualized Educational Video(s) Outcome: Progressing   Problem: Fluid Volume: Goal: Compliance with measures to maintain balanced fluid volume will improve Outcome: Progressing   Problem: Health Behavior/Discharge Planning: Goal: Ability to manage health-related needs will improve Outcome: Progressing   Problem: Nutritional: Goal: Ability to make healthy dietary choices will improve Outcome: Progressing   Problem: Clinical Measurements: Goal: Complications related to the disease process, condition or treatment will be avoided or minimized Outcome: Progressing   Problem: Education: Goal: Knowledge of the prescribed therapeutic regimen will improve Outcome:  Progressing Goal: Ability to verbalize activity precautions or restrictions will improve Outcome: Progressing Goal: Understanding of discharge needs will improve Outcome: Progressing   Problem: Activity: Goal: Ability to perform//tolerate increased activity and mobilize with assistive devices will improve Outcome: Progressing   Problem: Clinical Measurements: Goal: Postoperative complications will be avoided or minimized Outcome: Progressing   Problem: Self-Care: Goal: Ability to meet self-care needs will improve Outcome: Progressing   Problem: Self-Concept: Goal: Ability to maintain and perform role responsibilities to the fullest extent possible will improve Outcome: Progressing   Problem: Pain Management: Goal: Pain level will decrease with appropriate interventions Outcome: Progressing   Problem: Skin Integrity: Goal: Demonstration of wound healing without infection will improve Outcome: Progressing

## 2023-04-10 DIAGNOSIS — R5381 Other malaise: Secondary | ICD-10-CM | POA: Diagnosis not present

## 2023-04-10 DIAGNOSIS — H538 Other visual disturbances: Secondary | ICD-10-CM | POA: Diagnosis not present

## 2023-04-10 DIAGNOSIS — D638 Anemia in other chronic diseases classified elsewhere: Secondary | ICD-10-CM | POA: Diagnosis not present

## 2023-04-10 DIAGNOSIS — R531 Weakness: Secondary | ICD-10-CM | POA: Diagnosis not present

## 2023-04-10 MED ORDER — BISACODYL 5 MG PO TBEC
5.0000 mg | DELAYED_RELEASE_TABLET | Freq: Once | ORAL | Status: AC
Start: 2023-04-10 — End: 2023-04-10
  Administered 2023-04-10: 5 mg via ORAL
  Filled 2023-04-10: qty 1

## 2023-04-10 MED ORDER — POLYETHYLENE GLYCOL 3350 17 G PO PACK
17.0000 g | PACK | Freq: Two times a day (BID) | ORAL | Status: DC
  Administered 2023-04-11 – 2023-04-14 (×6): 17 g via ORAL
  Filled 2023-04-10 (×6): qty 1

## 2023-04-10 MED ORDER — LACTULOSE 10 GM/15ML PO SOLN
20.0000 g | Freq: Once | ORAL | Status: AC
  Administered 2023-04-10: 20 g via ORAL
  Filled 2023-04-10: qty 30

## 2023-04-10 NOTE — Plan of Care (Signed)
  Problem: Clinical Measurements: Goal: Ability to maintain clinical measurements within normal limits will improve Outcome: Progressing Goal: Diagnostic test results will improve Outcome: Progressing Goal: Respiratory complications will improve Outcome: Progressing Goal: Cardiovascular complication will be avoided Outcome: Progressing   Problem: Activity: Goal: Risk for activity intolerance will decrease Outcome: Progressing   Problem: Nutrition: Goal: Adequate nutrition will be maintained Outcome: Progressing   Problem: Coping: Goal: Level of anxiety will decrease Outcome: Progressing   Problem: Elimination: Goal: Will not experience complications related to bowel motility Outcome: Progressing Goal: Will not experience complications related to urinary retention Outcome: Progressing   Problem: Pain Managment: Goal: General experience of comfort will improve and/or be controlled Outcome: Progressing   Problem: Safety: Goal: Ability to remain free from injury will improve Outcome: Progressing   Problem: Skin Integrity: Goal: Risk for impaired skin integrity will decrease Outcome: Progressing   Problem: Education: Goal: Knowledge of disease and its progression will improve Outcome: Progressing Goal: Individualized Educational Video(s) Outcome: Progressing   Problem: Fluid Volume: Goal: Compliance with measures to maintain balanced fluid volume will improve Outcome: Progressing   Problem: Health Behavior/Discharge Planning: Goal: Ability to manage health-related needs will improve Outcome: Progressing   Problem: Clinical Measurements: Goal: Complications related to the disease process, condition or treatment will be avoided or minimized Outcome: Progressing   Problem: Education: Goal: Knowledge of the prescribed therapeutic regimen will improve Outcome: Progressing Goal: Ability to verbalize activity precautions or restrictions will improve Outcome:  Progressing Goal: Understanding of discharge needs will improve Outcome: Progressing   Problem: Activity: Goal: Ability to perform//tolerate increased activity and mobilize with assistive devices will improve Outcome: Progressing   Problem: Clinical Measurements: Goal: Postoperative complications will be avoided or minimized Outcome: Progressing   Problem: Self-Care: Goal: Ability to meet self-care needs will improve Outcome: Progressing   Problem: Self-Concept: Goal: Ability to maintain and perform role responsibilities to the fullest extent possible will improve Outcome: Progressing   Problem: Pain Management: Goal: Pain level will decrease with appropriate interventions Outcome: Progressing   Problem: Skin Integrity: Goal: Demonstration of wound healing without infection will improve Outcome: Progressing

## 2023-04-10 NOTE — Progress Notes (Signed)
[facility] Progress Note   Subjective:    Seen and examined patient at bedside. No acute complaints. Dialysis was not done yesterday d/t high patient census. Next HD 2/17.  Objective Vitals:   04/09/23 1645 04/09/23 2027 04/10/23 0427 04/10/23 0802  BP: (!) 155/64 (!) 172/64 (!) 163/75 (!) 150/55  Pulse: (!) 59 (!) 59 (!) 56 62  Resp: 17 18 18 18   Temp: 98.1 F (36.7 C) 97.8 F (36.6 C) 98.1 F (36.7 C) 98.2 F (36.8 C)  TempSrc:  Oral Oral Oral  SpO2: 94% 98% 98% 98%  Weight:      Height:       Physical Exam  General: Alert chronically ill male NAD Heart: RRR no MRG Lungs: Clear anteriorly; nonlabored breathing Abdomen: Soft and non-tender Extremities: No pedal edema, R AKA-wound vac removed, dressing CDI Dialysis Access:  LU A  AVG + bruit  Filed Weights   04/06/23 0130 04/08/23 0819 04/08/23 1105  Weight: 80 kg 80 kg 79.2 kg    Intake/Output Summary (Last 24 hours) at 04/10/2023 1422 Last data filed at 04/09/2023 1500 Gross per 24 hour  Intake --  Output 0 ml  Net 0 ml    Additional Objective Labs: Basic Metabolic Panel: Recent Labs  Lab 04/04/23 0356 04/04/23 1134 04/05/23 0352 04/06/23 1433 04/07/23 1159 04/08/23 0500 04/09/23 0955  NA 139  --  142   < > 135 137 137  K 5.2*   < > 5.2*   < > 4.6 5.1 5.0  CL 96*  --  103   < > 96* 99 99  CO2 25  --  24  --  21* 23 25  GLUCOSE 96  --  75   < > 109* 114* 88  BUN 115*  --  139*   < > 123* 151* 101*  CREATININE 9.32*  --  10.47*   < > 10.62* 11.69* 8.82*  CALCIUM 9.1  --  9.0  --  8.5* 8.7* 9.1  PHOS 6.6*  --  6.9*  --   --   --   --    < > = values in this interval not displayed.   Liver Function Tests: Recent Labs  Lab 04/04/23 0356 04/05/23 0352  ALBUMIN 2.5* 2.5*   No results for input(s): "LIPASE", "AMYLASE" in the last 168 hours. CBC: Recent Labs  Lab 04/05/23 0352 04/06/23 1238 04/06/23 1433 04/07/23 1159 04/08/23 0500 04/09/23 0955  WBC 6.9 8.1  --  8.8 7.3 8.3   NEUTROABS 3.9  --   --   --   --   --   HGB 8.0* 7.8*   < > 7.9* 7.2* 8.1*  HCT 26.5* 25.5*   < > 26.1* 23.7* 26.7*  MCV 75.5* 74.6*  --  75.0* 76.0* 75.0*  PLT 180 182  --  172 163 161   < > = values in this interval not displayed.   Blood Culture    Component Value Date/Time   SDES TISSUE 02/04/2023 1206   SDES WOUND 02/04/2023 1206   SPECREQUEST RIGHT FOOT 02/04/2023 1206   SPECREQUEST RIGHT FOOT 02/04/2023 1206   CULT  02/04/2023 1206    ABUNDANT STAPHYLOCOCCUS AUREUS MODERATE CORYNEBACTERIUM STRIATUM Standardized susceptibility testing for this organism is not available. NO ANAEROBES ISOLATED Performed at [facility], [address] [place], [place] [address]    CULT  02/04/2023 1206    ABUNDANT STAPHYLOCOCCUS AUREUS SUSCEPTIBILITIES PERFORMED ON PREVIOUS CULTURE WITHIN THE LAST  5 DAYS. MODERATE CORYNEBACTERIUM STRIATUM Standardized susceptibility testing for this organism is not available. NO ANAEROBES ISOLATED Performed at [facility], [address] [place], [place] [address]    REPTSTATUS 02/09/2023 FINAL 02/04/2023 1206   REPTSTATUS 02/09/2023 FINAL 02/04/2023 1206    Cardiac Enzymes: No results for input(s): "CKTOTAL", "CKMB", "CKMBINDEX", "TROPONINI" in the last 168 hours. CBG: Recent Labs  Lab 04/06/23 1630  GLUCAP 75   Iron Studies: No results for input(s): "IRON", "TIBC", "TRANSFERRIN", "FERRITIN" in the last 72 hours. Lab Results  Component Value Date   INR 1.1 03/01/2022   INR 1.6 (H) 11/21/2021   INR 1.3 (H) 08/12/2021   Studies/Results: No results found.  Medications:  sodium chloride      (feeding supplement) PROSource Plus  30 mL Oral TID BM   amiodarone  200 mg Oral Daily   vitamin C  1,000 mg Oral Daily   aspirin EC  81 mg Oral Daily   atorvastatin  80 mg Oral QHS   bisacodyl  10 mg Rectal Once   calcitRIOL  2.25 mcg Oral Q T,Th,Sat-1800   calcium acetate  1,334 mg Oral TID WC   carvedilol  12.5 mg Oral BID    Chlorhexidine Gluconate Cloth  6 each Topical Q0600   cinacalcet  60 mg Oral Q T,Th,Sat-1800   [START ON 04/12/2023] darbepoetin (ARANESP) injection - DIALYSIS  200 mcg Subcutaneous Q Tue-1800   dorzolamide-timolol  1 drop Right Eye BID   hydrocerin   Topical BID   levETIRAcetam  500 mg Oral Once per day on Sunday Monday Wednesday Friday   levETIRAcetam  750 mg Oral Once per day on Tuesday Thursday Saturday   lidocaine-prilocaine  1 Application Topical Once per day on Monday Wednesday Friday   multivitamin  1 tablet Oral QHS   nutrition supplement (JUVEN)  1 packet Oral BID BM   pneumococcal 20-valent conjugate vaccine  0.5 mL Intramuscular Tomorrow-1000   polyethylene glycol  17 g Oral BID   SMOG  960 mL Rectal Once   zinc sulfate (50mg  elemental zinc)  220 mg Oral Daily    Dialysis Orders: [facility] TTS 3h  72kg  B400  LUA AVF  Heparin 1000+ 400 u/hr  Assessment/Plan: Right BKA with painful gangrenous wound: S/p right AKA from BKA 2/12 by [name]. Antibiotics per admit team/[name] ESRD -HD TTS. Off schedule. Dialysis was not done yesterday d/t high patient census. Next HD 2/17. Anticipate his EDW will need to be adjusted after his procedure. General Weakness/debility: secondary #1 patient family unable to care for patient. Inpatient rehab consulted yesterday. SNF recommended Hyperkalemia: I advised following the proper diet and limiting outside foods to keep his K+ under control. Follow trend closely. May need to consider Lokelma but will hold off for now. K+ 5, awaiting new lab results HTN/volume: no excess volume on exam . BP okay. On midodrine 5 mg 3 times daily.  Anemia: Hgb now 8.1. On Aranesp to weekly (recently raised). Transfuse for Hgb < 7 if needed given recent procedure. Secondary hyperparathyroidism: corrected calcium okay, phosphorus 5.2, on calcitriol, Sensipar, and calcium acetate as binder Atrial fibrillation: on amiodarone and Coreg DM T2= per  admit  Salome Holmes, NP [facility] 04/10/2023,2:22 PM  LOS: 6 days

## 2023-04-10 NOTE — Progress Notes (Signed)
[facility]                                                                              [name], is a 53 y.o. male, DOB - Dec 02, 1970, BJY:[id number] Admit date - 03/24/2023    Outpatient Primary MD for the patient is Tomasita Crumble T, FNP  LOS - 6  days  Chief Complaint  Patient presents with   Weakness       Brief summary   Patient is a 53 y.o. male with ESRD on HD TTS, recent right BKA ([name]), CAD, CVA, paroxysmal atrial fibrillation not on anticoagulation due to history of ICH, gastroparesis, DM type II, diabetic retinopathy, glaucoma, history of left eye enucleation who presented to [name], ED on 1/30 via EMS from dialysis unit with complaints of increased blurriness, weakness, cough and difficulty managing at home.  Patient states symptoms worse with dialysis.  Recently discharged from the hospital to SNF for 3 weeks and has been home over the last 10 days.  Patient reports is unable to take care of himself due to weakness and spouse working all the time.  Requesting placement.  MRA showed no intracranial large vessel occlusion or significant stenosis.  [name] discussed with ophthalmology on-call, [name] regarding his blurred vision in the setting of known diabetic retinopathy, glaucoma and history of left eye enucleation; ophthalmology recommended no other workup at this time and vision to be monitored and outpatient follow-up with his retina specialist.   [name] consulted for admission for further evaluation management of progressive weakness with TOC involvement for placement.  PT recommended SNF however insurance denied SNF authorization he is currently appealing the denial. He was transferred to [facility] for evaluation of the right BKA gangrene.  Plan for AKA on Wednesday   Assessment & Plan      Generalized weakness/deconditioning and gait disturbances -With recent prolonged hospitalization followed by subacute rehab after right BKA by [name] on  02/09/2023.   -Therapy evaluations recommended SNF however was denied by the insurance, patient is now appealing the denial.  Unable to care for himself at home, was briefly at home for 10 days after discharge from SNF.  Now has new right AKA since 04/06/2023 with wound VAC.     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.  Recently seen in outpatient office visit 03/22/2023. -Dressing changes and wound care were recommended -S/p right above-knee amputation with application of Prevena wound VAC on 04/06/2023 by [name] -Pain controlled, continue current pain medication regimen, no further escalation of pain meds.     Paroxysmal atrial fibrillation -Continue rate control with amiodarone, Coreg -Not on anticoagulation due to history of ICH    Diabetes mellitus type 2, with end-stage renal disease, peripheral vascular disease -CBGs currently controlled -Not on insulin   Essential hypertension -Continue Coreg, hydralazine IV as needed with parameters   Seizure disorder -Continue with Keppra 500 mg on Sunday Tuesday Thursday and Saturday and 750 mg daily on Monday Wednesday and Friday -No new seizures since  admission.     Anemia of chronic disease -Baseline hemoglobin between 7-8 -Hemoglobin 8.1     ESRD on dialysis on TTS -Nephrology consulted, undergoing HD  -Receives outpatient dialysis at [facility] clinic      Hyperkalemia -Stable     Autonomic dysfunction Continue midodrine    Pressure injury documentation Right thigh unstageable, POA Left heel, lateral, stage II, POA Left heel medial, deep tissue injury, POA -Wound care per nursing   Constipation -Patient reports that he has constipation for last 4 days, refuses enema -Increased MiraLAX to twice daily, lactulose 20 g p.o. x 1, added Dulcolax 5 mg p.o. x 1, continue Colace    Estimated body mass index is  21.82 kg/m as calculated from the following:   Height as of this encounter: 6\' 3"  (1.905 m).   Weight as of this encounter: 79.2 kg.  Code Status: Full code DVT Prophylaxis:  SCD's Start: 04/06/23 1738 SCDs Start: 03/24/23 2107   Level of Care: Level of care: Med-Surg Family Communication: Updated patient's wife at the bedside Disposition Plan:      Remains inpatient appropriate: Patient being screened for CIR   Procedures:    Consultants:   Orthopedics  Antimicrobials:   Anti-infectives (From admission, onward)    Start     Dose/Rate Route Frequency Ordered Stop   04/06/23 0600  ceFAZolin (ANCEF) IVPB 2g/100 mL premix        2 g 200 mL/hr over 30 Minutes Intravenous On call to O.R. 04/05/23 1855 04/06/23 1547          Medications  (feeding supplement) PROSource Plus  30 mL Oral TID BM   amiodarone  200 mg Oral Daily   vitamin C  1,000 mg Oral Daily   aspirin EC  81 mg Oral Daily   atorvastatin  80 mg Oral QHS   bisacodyl  10 mg Rectal Once   calcitRIOL  2.25 mcg Oral Q T,Th,Sat-1800   calcium acetate  1,334 mg Oral TID WC   carvedilol  12.5 mg Oral BID   Chlorhexidine Gluconate Cloth  6 each Topical Q0600   cinacalcet  60 mg Oral Q T,Th,Sat-1800   [START ON 04/12/2023] darbepoetin (ARANESP) injection - DIALYSIS  200 mcg Subcutaneous Q Tue-1800   dorzolamide-timolol  1 drop Right Eye BID   hydrocerin   Topical BID   levETIRAcetam  500 mg Oral Once per day on Sunday Monday Wednesday Friday   levETIRAcetam  750 mg Oral Once per day on Tuesday Thursday Saturday   lidocaine-prilocaine  1 Application Topical Once per day on Monday Wednesday Friday   multivitamin  1 tablet Oral QHS   nutrition supplement (JUVEN)  1 packet Oral BID BM   pneumococcal 20-valent conjugate vaccine  0.5 mL Intramuscular Tomorrow-1000   polyethylene glycol  17 g Oral Daily   SMOG  960 mL Rectal Once   zinc sulfate (50mg  elemental zinc)  220 mg Oral Daily      Subjective:   [name]  Macdonald was seen and examined today.  Pain controlled, not on wound VAC.  States conservators for last 4 days.  No nausea vomiting, chest pain, abdominal pain  Objective:   Vitals:   04/09/23 1645 04/09/23 2027 04/10/23 0427 04/10/23 0802  BP: (!) 155/64 (!) 172/64 (!) 163/75 (!) 150/55  Pulse: (!) 59 (!) 59 (!) 56 62  Resp: 17 18 18 18   Temp: 98.1 F (36.7 C) 97.8 F (36.6 C) 98.1 F (36.7 C) 98.2 F (36.8  C)  TempSrc:  Oral Oral Oral  SpO2: 94% 98% 98% 98%  Weight:      Height:        Intake/Output Summary (Last 24 hours) at 04/10/2023 1111 Last data filed at 04/09/2023 1500 Gross per 24 hour  Intake 240 ml  Output 0 ml  Net 240 ml      Wt Readings from Last 3 Encounters:  04/08/23 79.2 kg  03/17/23 79.4 kg  02/28/23 80.4 kg    Physical Exam General: Alert and oriented x 3, NAD Cardiovascular: S1 S2 clear, RRR.  Respiratory: CTAB, no wheezing,  Gastrointestinal: Soft, nontender, nondistended, NBS Ext: right AKA Neuro: no new deficits Skin: Dressing intact on right AKA stump Psych: Normal affect     Data Reviewed:  I have personally reviewed following labs    CBC Lab Results  Component Value Date   WBC 8.3 04/09/2023   RBC 3.56 (L) 04/09/2023   HGB 8.1 (L) 04/09/2023   HCT 26.7 (L) 04/09/2023   MCV 75.0 (L) 04/09/2023   MCH 22.8 (L) 04/09/2023   PLT 161 04/09/2023   MCHC 30.3 04/09/2023   RDW 21.2 (H) 04/09/2023   LYMPHSABS 1.4 04/05/2023   MONOABS 0.8 04/05/2023   EOSABS 0.7 (H) 04/05/2023   BASOSABS 0.1 04/05/2023     Last metabolic panel Lab Results  Component Value Date   NA 137 04/09/2023   K 5.0 04/09/2023   CL 99 04/09/2023   CO2 25 04/09/2023   BUN 101 (H) 04/09/2023   CREATININE 8.82 (H) 04/09/2023   GLUCOSE 88 04/09/2023   GFRNONAA 7 (L) 04/09/2023   GFRAA 5 (L) 08/27/2019   CALCIUM 9.1 04/09/2023   PHOS 6.9 (H) 04/05/2023   PROT 6.4 (L) 03/24/2023   ALBUMIN 2.5 (L) 04/05/2023   LABGLOB 2.9 08/19/2016   AGRATIO 0.9  08/19/2016   BILITOT 0.7 03/24/2023   ALKPHOS 62 03/24/2023   AST 13 (L) 03/24/2023   ALT 14 03/24/2023   ANIONGAP 13 04/09/2023    CBG (last 3)  No results for input(s): "GLUCAP" in the last 72 hours.     Coagulation Profile: No results for input(s): "INR", "PROTIME" in the last 168 hours.   Radiology Studies: I have personally reviewed the imaging studies  No results found.      Thad Ranger M.D. [facility] 04/10/2023, 11:11 AM  Available via Epic secure chat 7am-7pm After 7 pm, please refer to night coverage provider listed on amion.

## 2023-04-11 DIAGNOSIS — R5381 Other malaise: Secondary | ICD-10-CM

## 2023-04-11 DIAGNOSIS — H538 Other visual disturbances: Secondary | ICD-10-CM | POA: Diagnosis not present

## 2023-04-11 DIAGNOSIS — D638 Anemia in other chronic diseases classified elsewhere: Secondary | ICD-10-CM | POA: Diagnosis not present

## 2023-04-11 DIAGNOSIS — R531 Weakness: Secondary | ICD-10-CM | POA: Diagnosis not present

## 2023-04-11 MED ORDER — ONDANSETRON HCL 4 MG/2ML IJ SOLN
4.0000 mg | Freq: Four times a day (QID) | INTRAMUSCULAR | Status: DC | PRN
  Administered 2023-04-11: 4 mg via INTRAVENOUS
  Filled 2023-04-11: qty 2

## 2023-04-11 MED ORDER — OXYCODONE HCL 5 MG PO TABS
5.0000 mg | ORAL_TABLET | ORAL | Status: DC | PRN
  Administered 2023-04-11 – 2023-04-14 (×5): 5 mg via ORAL
  Filled 2023-04-11 (×6): qty 1

## 2023-04-11 MED ORDER — HYDROMORPHONE HCL 1 MG/ML IJ SOLN: 1.0000 mg | INTRAMUSCULAR | Status: DC | PRN

## 2023-04-11 MED ORDER — HYDROXYZINE HCL 25 MG PO TABS
25.0000 mg | ORAL_TABLET | Freq: Three times a day (TID) | ORAL | Status: DC | PRN
Start: 2023-04-11 — End: 2023-04-14
  Administered 2023-04-11: 25 mg via ORAL
  Filled 2023-04-11 (×2): qty 1

## 2023-04-11 NOTE — Progress Notes (Signed)
Physical Therapy Treatment Patient Details Name: [name]. MRN: [id number] DOB: [birth year] Today's Date: 04/11/2023   History of Present Illness The pt is a 53 yo male presenting 1/30 from HD with blurriness and weakness since BKA on 02/09/23. Admitted to [facility] for FTT, inability to manage at home, transferred to [facility] 2/6 for surgical debridement. 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 PMH includes:  ESRD on HD TTS, recent right BKA ([name]), CAD, CVA, paroxysmal atrial fibrillation not on anticoagulation due to history of ICH, gastroparesis, type 2 diabetes mellitus, diabetic retinopathy, glaucoma, and history of left eye enucleation.    PT Comments  The pt was agreeable to session after premedication. He reports continued pain in RLE and LLE, as well as difficulty breathing this afternoon (SpO2 95% on RA). The pt required increased assistance to complete bed mobility due to pain at this time. He also required increased assistance to maintain static sitting EOB, maintains strong posterior lean despite cues, and eventually needed max-totalA to maintain sitting EOB. The pt attempted various transfer methods (lateral with use of slideboard, A-P with assist to chuck pad, and eventually lift). He fatigued quickly and was unable to generate appropriate clearance of hips to complete scooting, even with maxA of 2. Will continue to benefit from skilled PT acutely and continued inpatient rehab <3hours/day to maximize functional recovery. Recommendations updated to SNF as pt remains unable to return home at current mobility level and CIR denied.    If plan is discharge home, recommend the following: A lot of help with bathing/dressing/bathroom;A lot of help with walking and/or transfers;Assistance with cooking/housework;Help with stairs or ramp for entrance   Can travel by private vehicle     No  Equipment Recommendations  Wheelchair (measurements PT);Wheelchair cushion (measurements PT);Hoyer lift    Recommendations for  Other Services       Precautions / Restrictions Precautions Precautions: Fall Recall of Precautions/Restrictions: Intact Precaution/Restrictions Comments: R AKA with wound vac Restrictions Weight Bearing Restrictions Per Provider Order: No     Mobility  Bed Mobility Overal bed mobility: Needs Assistance Bed Mobility: Rolling, Sidelying to Sit Rolling: Used rails, Mod assist, +2 for physical assistance Sidelying to sit: Mod assist, Used rails, Max assist, +2 for physical assistance       General bed mobility comments: mod-maxA to complete transfer to EOB today, pt reports significant pain, needing increased cues for positioning, sequencing, and encouragement    Transfers Overall transfer level: Needs assistance Equipment used: Sliding board Transfers: Bed to chair/wheelchair/BSC Sit to Stand: Via lift equipment, Max assist, +2 physical assistance, +2 safety/equipment          Lateral/Scoot Transfers: +2 physical assistance, +2 safety/equipment, Max assist, Mod assist, With slide board General transfer comment: attempted lateral scoot with slideboard with pt unable to wt shift or create much clearance with UE to complete scooting along slideboard. then attempted A-P scoot with use of pad and max, pt only tolerated single posterior scoot before needing totalA to maintain sitting EOB and therefore we opted for use of lift for pt and therapist safety Transfer via Lift Equipment: Maximove  Ambulation/Gait               General Gait Details: pt unable   Stairs             Wheelchair Mobility     Tilt Bed    Modified Rankin (Stroke Patients Only)       Balance Overall balance assessment: Needs assistance Sitting-balance support: Feet supported, Bilateral  upper extremity supported Sitting balance-Leahy Scale: Fair Sitting balance - Comments: poor/fair as pt initially with posterior LOB without support, to CGA at times but easily loses balance with movement  of LE or UE. improved from last session with pt able to correct more himself and needing only minA to correct Postural control: Posterior lean, Left lateral lean   Standing balance-Leahy Scale: Zero                              Communication Communication Communication: No apparent difficulties  Cognition Arousal: Alert Behavior During Therapy: Agitated, Flat affect   PT - Cognitive impairments: No apparent impairments, Memory, Problem solving, Safety/Judgement, Initiation                       PT - Cognition Comments: impacted by pain, pt with limited verbalizations, needing repeated cues and instructions. no initiation, especially with RLE until directly cued. pt also with decreased awareness, delayed responses. Following commands: Intact      Cueing Cueing Techniques: Verbal cues, Tactile cues  Exercises      General Comments General comments (skin integrity, edema, etc.): VSS on RA, pt reports difficulty breathing, SpO2 95%      Pertinent Vitals/Pain Pain Assessment Pain Assessment: 0-10 Pain Score: 8  Faces Pain Scale: Hurts whole lot Pain Location: 8-9 for RLE, 6/10 for L heel Pain Descriptors / Indicators: Discomfort, Sore, Sharp, Throbbing Pain Intervention(s): Limited activity within patient's tolerance, Monitored during session, Repositioned, Premedicated before session     PT Goals (current goals can now be found in the care plan section) Acute Rehab PT Goals Patient Stated Goal: to be able to complete lateral transfer PT Goal Formulation: With patient Time For Goal Achievement: 04/21/23 Potential to Achieve Goals: Fair Progress towards PT goals: Progressing toward goals    Frequency    Min 3X/week       AM-PAC PT "6 Clicks" Mobility   Outcome Measure  Help needed turning from your back to your side while in a flat bed without using bedrails?: A Little Help needed moving from lying on your back to sitting on the side of a flat  bed without using bedrails?: A Lot Help needed moving to and from a bed to a chair (including a wheelchair)?: Total Help needed standing up from a chair using your arms (e.g., wheelchair or bedside chair)?: Total Help needed to walk in hospital room?: Total Help needed climbing 3-5 steps with a railing? : Total 6 Click Score: 9    End of Session Equipment Utilized During Treatment: Gait belt;Other (comment) (lift, slideboard) Activity Tolerance: Patient limited by fatigue;Patient limited by pain Patient left: with call bell/phone within reach;in bed;with bed alarm set;with family/visitor present Nurse Communication: Mobility status;Need for lift equipment PT Visit Diagnosis: Unsteadiness on feet (R26.81);Other abnormalities of gait and mobility (R26.89);Muscle weakness (generalized) (M62.81)     Time: 0981-1914 PT Time Calculation (min) (ACUTE ONLY): 55 min  Charges:    $Therapeutic Exercise: 23-37 mins $Therapeutic Activity: 23-37 mins PT General Charges $$ ACUTE PT VISIT: 1 Visit                    Vickki Muff, PT, DPT   Acute Rehabilitation Department Office [phone] Secure Chat Communication Preferred   Ronnie Derby 04/11/2023, 3:49 PM

## 2023-04-11 NOTE — Progress Notes (Signed)
[facility]                                                                              [name], is a 53 y.o. male, DOB - 1971/01/16, VWU:[id number] Admit date - 03/24/2023    Outpatient Primary MD for the patient is Tomasita Crumble T, FNP  LOS - 7  days  Chief Complaint  Patient presents with   Weakness       Brief summary   Patient is a 53 y.o. male with ESRD on HD TTS, recent right BKA ([name]), CAD, CVA, paroxysmal atrial fibrillation not on anticoagulation due to history of ICH, gastroparesis, DM type II, diabetic retinopathy, glaucoma, history of left eye enucleation who presented to [name], ED on 1/30 via EMS from dialysis unit with complaints of increased blurriness, weakness, cough and difficulty managing at home.  Patient states symptoms worse with dialysis.  Recently discharged from the hospital to SNF for 3 weeks and has been home over the last 10 days.  Patient reports is unable to take care of himself due to weakness and spouse working all the time.  Requesting placement.  MRA showed no intracranial large vessel occlusion or significant stenosis.  [name] discussed with ophthalmology on-call, [name] regarding his blurred vision in the setting of known diabetic retinopathy, glaucoma and history of left eye enucleation; ophthalmology recommended no other workup at this time and vision to be monitored and outpatient follow-up with his retina specialist.   [name] consulted for admission for further evaluation management of progressive weakness with TOC involvement for placement.  PT recommended SNF however insurance denied SNF authorization he is currently appealing the denial. He was transferred to [facility] for evaluation of the right BKA gangrene.  Plan for AKA on Wednesday   Assessment & Plan      Generalized weakness/deconditioning and gait disturbances -With recent prolonged hospitalization followed by subacute rehab after right BKA by [name] on  02/09/2023.   -Therapy evaluations recommended SNF however was denied by the insurance, patient is now appealing the denial.  Unable to care for himself at home, was briefly at home for 10 days after discharge from SNF.  Now has new right AKA since 04/06/2023 with wound VAC.     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.  Recently seen in outpatient office visit 03/22/2023. -Dressing changes and wound care were recommended -S/p right above-knee amputation with application of Prevena wound VAC on 04/06/2023 by [name] -Pain controlled, continue current pain medication regimen, no further escalation of pain meds.     Paroxysmal atrial fibrillation -Continue rate control with amiodarone, Coreg -Not on anticoagulation due to history of ICH    Diabetes mellitus type 2, with end-stage renal disease, peripheral vascular disease -CBGs currently controlled -Not on insulin   Essential hypertension -Continue Coreg, hydralazine IV as needed with parameters   Seizure disorder -Continue with Keppra 500 mg on Sunday Tuesday Thursday and Saturday and 750 mg daily on Monday Wednesday and Friday -No new seizures since  admission.     Anemia of chronic disease -Baseline hemoglobin between 7-8 -Hemoglobin 8.1     ESRD on dialysis on TTS -Nephrology consulted, undergoing HD  -Receives outpatient dialysis at [facility] clinic      Hyperkalemia -Stable     Autonomic dysfunction Continue midodrine    Pressure injury documentation Right thigh unstageable, POA Left heel, lateral, stage II, POA Left heel medial, deep tissue injury, POA -Wound care per nursing   Constipation -Continue MiraLAX, Colace, Refuses enema    Estimated body mass index is 21.82 kg/m as calculated from the following:   Height as of this encounter: 6\' 3"  (1.905 m).   Weight as of this encounter: 79.2  kg.  Code Status: Full code DVT Prophylaxis:  SCD's Start: 04/06/23 1738 SCDs Start: 03/24/23 2107   Level of Care: Level of care: Med-Surg Family Communication: Updated patient's wife at the bedside Disposition Plan:      Remains inpatient appropriate: Screened by CIR, recommended SNF   Procedures:    Consultants:   Orthopedics  Antimicrobials:   Anti-infectives (From admission, onward)    Start     Dose/Rate Route Frequency Ordered Stop   04/06/23 0600  ceFAZolin (ANCEF) IVPB 2g/100 mL premix        2 g 200 mL/hr over 30 Minutes Intravenous On call to O.R. 04/05/23 1855 04/06/23 1547          Medications  (feeding supplement) PROSource Plus  30 mL Oral TID BM   amiodarone  200 mg Oral Daily   vitamin C  1,000 mg Oral Daily   aspirin EC  81 mg Oral Daily   atorvastatin  80 mg Oral QHS   bisacodyl  10 mg Rectal Once   calcitRIOL  2.25 mcg Oral Q T,Th,Sat-1800   calcium acetate  1,334 mg Oral TID WC   carvedilol  12.5 mg Oral BID   Chlorhexidine Gluconate Cloth  6 each Topical Q0600   cinacalcet  60 mg Oral Q T,Th,Sat-1800   [START ON 04/12/2023] darbepoetin (ARANESP) injection - DIALYSIS  200 mcg Subcutaneous Q Tue-1800   dorzolamide-timolol  1 drop Right Eye BID   hydrocerin   Topical BID   levETIRAcetam  500 mg Oral Once per day on Sunday Monday Wednesday Friday   levETIRAcetam  750 mg Oral Once per day on Tuesday Thursday Saturday   lidocaine-prilocaine  1 Application Topical Once per day on Monday Wednesday Friday   multivitamin  1 tablet Oral QHS   nutrition supplement (JUVEN)  1 packet Oral BID BM   pneumococcal 20-valent conjugate vaccine  0.5 mL Intramuscular Tomorrow-1000   polyethylene glycol  17 g Oral BID   SMOG  960 mL Rectal Once   zinc sulfate (50mg  elemental zinc)  220 mg Oral Daily      Subjective:   [name] was seen and examined today.  No acute complaints.  Not on back anymore.  R LE stump looks healing.   Objective:    Vitals:   04/10/23 2143 04/11/23 0536 04/11/23 0605 04/11/23 0802  BP:  (!) 128/26 (!) 160/58 (!) 119/43  Pulse: 73 (!) 58 62 (!) 58  Resp:  18 17 18   Temp:  98.3 F (36.8 C) 98.7 F (37.1 C) 98 F (36.7 C)  TempSrc:  Oral Oral Oral  SpO2: 98% 100% 100% 100%  Weight:      Height:        Intake/Output Summary (Last 24 hours) at 04/11/2023 1256 Last data  filed at 04/10/2023 1700 Gross per 24 hour  Intake 120 ml  Output --  Net 120 ml      Wt Readings from Last 3 Encounters:  04/08/23 79.2 kg  03/17/23 79.4 kg  02/28/23 80.4 kg   Physical Exam General: Alert and oriented x 3, NAD Cardiovascular: S1 S2 clear, RRR.  Respiratory: CTAB, no wheezing Gastrointestinal: Soft, nontender, nondistended, NBS Ext: right AKA, staples intact Neuro: no new deficits Psych: Normal affect     Data Reviewed:  I have personally reviewed following labs    CBC Lab Results  Component Value Date   WBC 8.3 04/09/2023   RBC 3.56 (L) 04/09/2023   HGB 8.1 (L) 04/09/2023   HCT 26.7 (L) 04/09/2023   MCV 75.0 (L) 04/09/2023   MCH 22.8 (L) 04/09/2023   PLT 161 04/09/2023   MCHC 30.3 04/09/2023   RDW 21.2 (H) 04/09/2023   LYMPHSABS 1.4 04/05/2023   MONOABS 0.8 04/05/2023   EOSABS 0.7 (H) 04/05/2023   BASOSABS 0.1 04/05/2023     Last metabolic panel Lab Results  Component Value Date   NA 137 04/09/2023   K 5.0 04/09/2023   CL 99 04/09/2023   CO2 25 04/09/2023   BUN 101 (H) 04/09/2023   CREATININE 8.82 (H) 04/09/2023   GLUCOSE 88 04/09/2023   GFRNONAA 7 (L) 04/09/2023   GFRAA 5 (L) 08/27/2019   CALCIUM 9.1 04/09/2023   PHOS 6.9 (H) 04/05/2023   PROT 6.4 (L) 03/24/2023   ALBUMIN 2.5 (L) 04/05/2023   LABGLOB 2.9 08/19/2016   AGRATIO 0.9 08/19/2016   BILITOT 0.7 03/24/2023   ALKPHOS 62 03/24/2023   AST 13 (L) 03/24/2023   ALT 14 03/24/2023   ANIONGAP 13 04/09/2023    CBG (last 3)  No results for input(s): "GLUCAP" in the last 72 hours.     Coagulation Profile: No  results for input(s): "INR", "PROTIME" in the last 168 hours.   Radiology Studies: I have personally reviewed the imaging studies  No results found.      Thad Ranger M.D. [facility] 04/11/2023, 12:56 PM  Available via Epic secure chat 7am-7pm After 7 pm, please refer to night coverage provider listed on amion.

## 2023-04-11 NOTE — Plan of Care (Signed)
  Problem: Clinical Measurements: Goal: Ability to maintain clinical measurements within normal limits will improve Outcome: Progressing Goal: Diagnostic test results will improve Outcome: Progressing Goal: Respiratory complications will improve Outcome: Progressing Goal: Cardiovascular complication will be avoided Outcome: Progressing   Problem: Activity: Goal: Risk for activity intolerance will decrease Outcome: Progressing   Problem: Nutrition: Goal: Adequate nutrition will be maintained Outcome: Progressing   Problem: Coping: Goal: Level of anxiety will decrease Outcome: Progressing   Problem: Elimination: Goal: Will not experience complications related to bowel motility Outcome: Progressing Goal: Will not experience complications related to urinary retention Outcome: Progressing   Problem: Pain Managment: Goal: General experience of comfort will improve and/or be controlled Outcome: Progressing   Problem: Safety: Goal: Ability to remain free from injury will improve Outcome: Progressing   Problem: Skin Integrity: Goal: Risk for impaired skin integrity will decrease Outcome: Progressing   Problem: Education: Goal: Knowledge of disease and its progression will improve Outcome: Progressing Goal: Individualized Educational Video(s) Outcome: Progressing   Problem: Fluid Volume: Goal: Compliance with measures to maintain balanced fluid volume will improve Outcome: Progressing   Problem: Health Behavior/Discharge Planning: Goal: Ability to manage health-related needs will improve Outcome: Progressing   Problem: Nutritional: Goal: Ability to make healthy dietary choices will improve Outcome: Progressing   Problem: Clinical Measurements: Goal: Complications related to the disease process, condition or treatment will be avoided or minimized Outcome: Progressing   Problem: Education: Goal: Knowledge of the prescribed therapeutic regimen will improve Outcome:  Progressing Goal: Ability to verbalize activity precautions or restrictions will improve Outcome: Progressing Goal: Understanding of discharge needs will improve Outcome: Progressing   Problem: Activity: Goal: Ability to perform//tolerate increased activity and mobilize with assistive devices will improve Outcome: Progressing   Problem: Clinical Measurements: Goal: Postoperative complications will be avoided or minimized Outcome: Progressing   Problem: Self-Care: Goal: Ability to meet self-care needs will improve Outcome: Progressing   Problem: Self-Concept: Goal: Ability to maintain and perform role responsibilities to the fullest extent possible will improve Outcome: Progressing   Problem: Pain Management: Goal: Pain level will decrease with appropriate interventions Outcome: Progressing   Problem: Skin Integrity: Goal: Demonstration of wound healing without infection will improve Outcome: Progressing

## 2023-04-11 NOTE — Progress Notes (Signed)
[facility] Progress Note   Subjective: Seen lying flat in bed, eyes closed. Denies pain/discomfort.    Objective Vitals:   04/10/23 2143 04/11/23 0536 04/11/23 0605 04/11/23 0802  BP:  (!) 128/26 (!) 160/58 (!) 119/43  Pulse: 73 (!) 58 62 (!) 58  Resp:  18 17 18   Temp:  98.3 F (36.8 C) 98.7 F (37.1 C) 98 F (36.7 C)  TempSrc:  Oral Oral Oral  SpO2: 98% 100% 100% 100%  Weight:      Height:       Physical Exam General: Chronically ill appearing male in NAD Heart: S1,S2 RRR No M/R/G Lungs: CTAB No WOB Abdomen:NABS, NT Extremities: R AKA LLE no pedal edema Dialysis Access: LUA AVG + T/B  Additional Objective Labs: Basic Metabolic Panel: Recent Labs  Lab 04/05/23 0352 04/06/23 1433 04/07/23 1159 04/08/23 0500 04/09/23 0955  NA 142   < > 135 137 137  K 5.2*   < > 4.6 5.1 5.0  CL 103   < > 96* 99 99  CO2 24  --  21* 23 25  GLUCOSE 75   < > 109* 114* 88  BUN 139*   < > 123* 151* 101*  CREATININE 10.47*   < > 10.62* 11.69* 8.82*  CALCIUM 9.0  --  8.5* 8.7* 9.1  PHOS 6.9*  --   --   --   --    < > = values in this interval not displayed.   Liver Function Tests: Recent Labs  Lab 04/05/23 0352  ALBUMIN 2.5*   No results for input(s): "LIPASE", "AMYLASE" in the last 168 hours. CBC: Recent Labs  Lab 04/05/23 0352 04/06/23 1238 04/06/23 1433 04/07/23 1159 04/08/23 0500 04/09/23 0955  WBC 6.9 8.1  --  8.8 7.3 8.3  NEUTROABS 3.9  --   --   --   --   --   HGB 8.0* 7.8*   < > 7.9* 7.2* 8.1*  HCT 26.5* 25.5*   < > 26.1* 23.7* 26.7*  MCV 75.5* 74.6*  --  75.0* 76.0* 75.0*  PLT 180 182  --  172 163 161   < > = values in this interval not displayed.   Blood Culture    Component Value Date/Time   SDES TISSUE 02/04/2023 1206   SDES WOUND 02/04/2023 1206   SPECREQUEST RIGHT FOOT 02/04/2023 1206   SPECREQUEST RIGHT FOOT 02/04/2023 1206   CULT  02/04/2023 1206    ABUNDANT STAPHYLOCOCCUS AUREUS MODERATE CORYNEBACTERIUM STRIATUM Standardized  susceptibility testing for this organism is not available. NO ANAEROBES ISOLATED Performed at [facility], [address] [place], [place] [address]    CULT  02/04/2023 1206    ABUNDANT STAPHYLOCOCCUS AUREUS SUSCEPTIBILITIES PERFORMED ON PREVIOUS CULTURE WITHIN THE LAST 5 DAYS. MODERATE CORYNEBACTERIUM STRIATUM Standardized susceptibility testing for this organism is not available. NO ANAEROBES ISOLATED Performed at [facility], [address] [place], [place] [address]    REPTSTATUS 02/09/2023 FINAL 02/04/2023 1206   REPTSTATUS 02/09/2023 FINAL 02/04/2023 1206    Cardiac Enzymes: No results for input(s): "CKTOTAL", "CKMB", "CKMBINDEX", "TROPONINI" in the last 168 hours. CBG: Recent Labs  Lab 04/06/23 1630  GLUCAP 75   Iron Studies: No results for input(s): "IRON", "TIBC", "TRANSFERRIN", "FERRITIN" in the last 72 hours. @lablastinr3 @ Studies/Results: No results found. Medications:  sodium chloride      (feeding supplement) PROSource Plus  30 mL Oral TID BM   amiodarone  200 mg Oral Daily  vitamin C  1,000 mg Oral Daily   aspirin EC  81 mg Oral Daily   atorvastatin  80 mg Oral QHS   bisacodyl  10 mg Rectal Once   calcitRIOL  2.25 mcg Oral Q T,Th,Sat-1800   calcium acetate  1,334 mg Oral TID WC   carvedilol  12.5 mg Oral BID   Chlorhexidine Gluconate Cloth  6 each Topical Q0600   cinacalcet  60 mg Oral Q T,Th,Sat-1800   [START ON 04/12/2023] darbepoetin (ARANESP) injection - DIALYSIS  200 mcg Subcutaneous Q Tue-1800   dorzolamide-timolol  1 drop Right Eye BID   hydrocerin   Topical BID   levETIRAcetam  500 mg Oral Once per day on Sunday Monday Wednesday Friday   levETIRAcetam  750 mg Oral Once per day on Tuesday Thursday Saturday   lidocaine-prilocaine  1 Application Topical Once per day on Monday Wednesday Friday   multivitamin  1 tablet Oral QHS   nutrition supplement (JUVEN)  1 packet Oral BID BM   pneumococcal 20-valent conjugate vaccine  0.5 mL  Intramuscular Tomorrow-1000   polyethylene glycol  17 g Oral BID   SMOG  960 mL Rectal Once   zinc sulfate (50mg  elemental zinc)  220 mg Oral Daily     Dialysis Orders: [facility] TTS 3h  72kg  B400  LUA AVF  Heparin 1000+ 400 u/hr   Assessment/Plan: Right BKA with painful gangrenous wound: S/p right AKA from BKA 2/12 by [name]. Antibiotics per admit team/[name] ESRD -HD TTS. Off schedule. Dialysis was not done yesterday d/t high patient census. Next HD 2/17. Schedule for week will be Monday, Thursday 04/14/2023 and Saturday 04/16/2023 General Weakness/debility: secondary #1 patient family unable to care for patient. Inpatient rehab consulted yesterday. SNF recommended Hyperkalemia: I advised following the proper diet and limiting outside foods to keep his K+ under control. Follow trend closely. May need to consider Lokelma but will hold off for now. K+ 5, awaiting new lab results HTN/volume: no excess volume on exam . BP okay. On midodrine 5 mg 3 times daily.  Anemia: Hgb now 8.1. On Aranesp to weekly (recently raised). Transfuse for Hgb < 7 if needed given recent procedure. Secondary hyperparathyroidism: corrected calcium okay, phosphorus 5.2, on calcitriol, Sensipar, and calcium acetate as binder Atrial fibrillation: on amiodarone and Coreg, no anticoag DMT2- per primary  Howard Bunte H. Ayeza Therriault NP-C 04/11/2023, 10:19 AM  [employer] [phone]

## 2023-04-11 NOTE — Progress Notes (Signed)
Inpatient Rehab Admissions Coordinator:   Pt continues to require significant assist for limited mobility at this time.  Spouse unable to provide physical assist at d/c and not able to provide consistent 24/7 assist.  He will likely benefit from SNF at discharge from the acute setting.  I will sign off for CIR at this time.   Estill Dooms, PT, DPT Admissions Coordinator [phone] 04/11/23  11:18 AM

## 2023-04-11 NOTE — TOC Progression Note (Addendum)
Transition of Care (TOC) - Progression Note    Patient Details  Name: [name]. MRN: [id number] Date of Birth: 13-Dec-1970  Transition of Care [facility]) CM/SW Contact  Mearl Latin, LCSW Phone Number: 04/11/2023, 10:25 AM  Clinical Narrative:    10:25am-CSW following and sent update to [facility] rehab; CIR also following.   11:38 AM-Per CIR, SNF more appropriate. [facility] awaiting male rehab bed.   3:51 PM-[facility] opening up a bed tomorrow. CSW confirming they can accommodate his dialysis schedule and will begin insurance process once updated therapy note is in.   4:13 PM-CSW submitted clinicals for review, Ref# [id number].    Barriers to Discharge: Insurance Authorization  Expected Discharge Plan and Services                                               Social Determinants of Health (SDOH) Interventions SDOH Screenings   Food Insecurity: Patient Declined (03/31/2023)  Housing: Patient Declined (03/31/2023)  Transportation Needs: Patient Declined (03/31/2023)  Utilities: Patient Declined (03/31/2023)  Financial Resource Strain: High Risk (07/05/2017)  Physical Activity: Inactive (07/05/2017)  Social Connections: Unknown (03/31/2023)  Stress: Stress Concern Present (07/05/2017)  Tobacco Use: Low Risk  (04/06/2023)  Health Literacy: Low Risk  (06/01/2020)   Received from [facility]    Readmission Risk Interventions    07/27/2021   11:49 AM 11/27/2020    3:06 PM  Readmission Risk Prevention Plan  Transportation Screening Complete Complete  PCP or Specialist Appt within 3-5 Days  Complete  HRI or Home Care Consult  Complete  Social Work Consult for Recovery Care Planning/Counseling  Complete  Palliative Care Screening  Not Applicable  Medication Review Oceanographer) Complete Complete  SW Recovery Care/Counseling Consult Complete   Palliative Care Screening Not Applicable   Skilled Nursing Facility Not Applicable

## 2023-04-12 DIAGNOSIS — D638 Anemia in other chronic diseases classified elsewhere: Secondary | ICD-10-CM | POA: Diagnosis not present

## 2023-04-12 DIAGNOSIS — H538 Other visual disturbances: Secondary | ICD-10-CM | POA: Diagnosis not present

## 2023-04-12 DIAGNOSIS — R5381 Other malaise: Secondary | ICD-10-CM | POA: Diagnosis not present

## 2023-04-12 DIAGNOSIS — R531 Weakness: Secondary | ICD-10-CM | POA: Diagnosis not present

## 2023-04-12 LAB — CBC
HCT: 28.1 % — ABNORMAL LOW (ref 39.0–52.0)
Hemoglobin: 8.6 g/dL — ABNORMAL LOW (ref 13.0–17.0)
MCH: 22.9 pg — ABNORMAL LOW (ref 26.0–34.0)
MCHC: 30.6 g/dL (ref 30.0–36.0)
MCV: 74.9 fL — ABNORMAL LOW (ref 80.0–100.0)
Platelets: 178 10*3/uL (ref 150–400)
RBC: 3.75 MIL/uL — ABNORMAL LOW (ref 4.22–5.81)
RDW: 20.6 % — ABNORMAL HIGH (ref 11.5–15.5)
WBC: 12.9 10*3/uL — ABNORMAL HIGH (ref 4.0–10.5)
nRBC: 0 % (ref 0.0–0.2)

## 2023-04-12 LAB — RENAL FUNCTION PANEL
Albumin: 2.5 g/dL — ABNORMAL LOW (ref 3.5–5.0)
Anion gap: 19 — ABNORMAL HIGH (ref 5–15)
BUN: 167 mg/dL — ABNORMAL HIGH (ref 6–20)
CO2: 20 mmol/L — ABNORMAL LOW (ref 22–32)
Calcium: 9.4 mg/dL (ref 8.9–10.3)
Chloride: 99 mmol/L (ref 98–111)
Creatinine, Ser: 12.34 mg/dL — ABNORMAL HIGH (ref 0.61–1.24)
GFR, Estimated: 4 mL/min — ABNORMAL LOW (ref >60)
Glucose, Bld: 78 mg/dL (ref 70–99)
Phosphorus: 8.4 mg/dL — ABNORMAL HIGH (ref 2.5–4.6)
Potassium: 6.4 mmol/L (ref 3.5–5.1)
Sodium: 138 mmol/L (ref 135–145)

## 2023-04-12 LAB — GLUCOSE, CAPILLARY: Glucose-Capillary: 79 mg/dL (ref 70–99)

## 2023-04-12 MED ORDER — DOCUSATE SODIUM 100 MG PO CAPS: 100.0000 mg | ORAL_CAPSULE | Freq: Two times a day (BID) | ORAL | Status: DC

## 2023-04-12 MED ORDER — OXYCODONE HCL 5 MG PO TABS: 5.0000 mg | ORAL_TABLET | Freq: Four times a day (QID) | ORAL | 0 refills | Status: DC | PRN

## 2023-04-12 MED ORDER — ASPIRIN 81 MG PO TBEC
81.0000 mg | DELAYED_RELEASE_TABLET | Freq: Every day | ORAL | Status: DC
Start: 2023-04-12 — End: 2023-05-26

## 2023-04-12 MED ORDER — ASCORBIC ACID 1000 MG PO TABS: 1000.0000 mg | ORAL_TABLET | Freq: Every day | ORAL | Status: AC

## 2023-04-12 MED ORDER — ZINC SULFATE 220 (50 ZN) MG PO CAPS: 220.0000 mg | ORAL_CAPSULE | Freq: Every day | ORAL | Status: AC

## 2023-04-12 MED ORDER — RENA-VITE PO TABS: 1.0000 | ORAL_TABLET | Freq: Every day | ORAL | Status: DC

## 2023-04-12 MED ORDER — LIDOCAINE HCL (PF) 1 % IJ SOLN: 5.0000 mL | INTRAMUSCULAR | Status: DC | PRN

## 2023-04-12 MED ORDER — HEPARIN SODIUM (PORCINE) 1000 UNIT/ML DIALYSIS: 1000.0000 [IU] | INTRAMUSCULAR | Status: DC | PRN

## 2023-04-12 MED ORDER — HEPARIN SODIUM (PORCINE) 1000 UNIT/ML DIALYSIS
3500.0000 [IU] | Freq: Once | INTRAMUSCULAR | Status: AC
  Administered 2023-04-12: 3500 [IU] via INTRAVENOUS_CENTRAL
  Filled 2023-04-12: qty 4

## 2023-04-12 MED ORDER — LIDOCAINE-PRILOCAINE 2.5-2.5 % EX CREA: 1.0000 | TOPICAL_CREAM | CUTANEOUS | Status: DC | PRN

## 2023-04-12 MED ORDER — ALTEPLASE 2 MG IJ SOLR: 2.0000 mg | Freq: Once | INTRAMUSCULAR | Status: DC | PRN

## 2023-04-12 MED ORDER — HEPARIN SODIUM (PORCINE) 1000 UNIT/ML IJ SOLN: 4200.0000 [IU] | INTRAMUSCULAR | Status: DC | PRN

## 2023-04-12 MED ORDER — POLYETHYLENE GLYCOL 3350 17 G PO PACK: 17.0000 g | PACK | Freq: Every day | ORAL | Status: DC

## 2023-04-12 MED ORDER — PENTAFLUOROPROP-TETRAFLUOROETH EX AERO: 1.0000 | INHALATION_SPRAY | CUTANEOUS | Status: DC | PRN

## 2023-04-12 NOTE — Progress Notes (Signed)
Advised by CSW of pt's d/c to snf today. Contacted [facility] to be advised of pt's d/c today and that pt should resume care on Thursday. CSW provided pt's clinic and schedule info to provide to snf. D/C summary and today's renal note faxed to clinic for continuation of care.   Olivia Canter Renal Navigator [phone]

## 2023-04-12 NOTE — TOC Progression Note (Signed)
Transition of Care (TOC) - Progression Note    Patient Details  Name: [name]. MRN: [id number] Date of Birth: Sep 07, 1970  Transition of Care [facility]) CM/SW Contact  Carley Hammed, LCSW Phone Number: 04/12/2023, 3:26 PM  Clinical Narrative:     CSW notified by SNF that transport to HD for their facility was canceled by the transportation company for Thursday due to inclement weather. Renal Navigator and facility rep attempted to put other services in place, none available. CSW and facility rep discussed options, and at this time, DC is being held due to not being able to safely get pt HD on Thursday. Medical team to review other options. CSW left VM for spouse to update on change of plans. TOC will continue to follow.     Barriers to Discharge: Barriers Resolved  Expected Discharge Plan and Services         Expected Discharge Date: 04/12/23                                     Social Determinants of Health (SDOH) Interventions SDOH Screenings   Food Insecurity: Patient Declined (03/31/2023)  Housing: Patient Declined (03/31/2023)  Transportation Needs: Patient Declined (03/31/2023)  Utilities: Patient Declined (03/31/2023)  Financial Resource Strain: High Risk (07/05/2017)  Physical Activity: Inactive (07/05/2017)  Social Connections: Unknown (03/31/2023)  Stress: Stress Concern Present (07/05/2017)  Tobacco Use: Low Risk  (04/06/2023)  Health Literacy: Low Risk  (06/01/2020)   Received from [facility]    Readmission Risk Interventions    07/27/2021   11:49 AM 11/27/2020    3:06 PM  Readmission Risk Prevention Plan  Transportation Screening Complete Complete  PCP or Specialist Appt within 3-5 Days  Complete  HRI or Home Care Consult  Complete  Social Work Consult for Recovery Care Planning/Counseling  Complete  Palliative Care Screening  Not Applicable  Medication Review Oceanographer) Complete Complete  SW Recovery Care/Counseling Consult  Complete   Palliative Care Screening Not Applicable   Skilled Nursing Facility Not Applicable

## 2023-04-12 NOTE — Progress Notes (Signed)
Lab informed me of critical Potassium of 6.4.  Informed NP Alonna Buckler on Dialysis Unit:  NP Manson Passey instructed to give 1K bath for 1 hour and 2K the rest of the session.  Lamiah Marmol-LPN in [place]

## 2023-04-12 NOTE — Progress Notes (Signed)
[facility] Progress Note   Subjective:  Hyperkalemia-K+ 6.4. Start on 1.0 K bath for one hour then switch to 2.0 K bath and complete treatment. Lethargic this AM. Minimally responsive to verbal stimuli but he doesn't talk very much.    Objective Vitals:   04/12/23 0406 04/12/23 0858 04/12/23 0859 04/12/23 0903  BP: (!) 167/60  (!) 123/56 (!) 119/49  Pulse: 60  (!) 58 (!) 58  Resp: 14  (!) 9 10  Temp: 98 F (36.7 C)  98.9 F (37.2 C)   TempSrc: Oral     SpO2: 96%  97% 96%  Weight:  79.1 kg    Height:       Physical Exam General: Chronically ill appearing male in NAD Heart: S1,S2 RRR No M/R/G Lungs: CTAB No WOB Abdomen:NABS, NT Extremities: R AKA LLE no pedal edema Dialysis Access: LUA AVG + T/B  Additional Objective Labs: Basic Metabolic Panel: Recent Labs  Lab 04/08/23 0500 04/09/23 0955 04/12/23 0757  NA 137 137 138  K 5.1 5.0 6.4*  CL 99 99 99  CO2 23 25 20*  GLUCOSE 114* 88 78  BUN 151* 101* 167*  CREATININE 11.69* 8.82* 12.34*  CALCIUM 8.7* 9.1 9.4  PHOS  --   --  8.4*   Liver Function Tests: Recent Labs  Lab 04/12/23 0757  ALBUMIN 2.5*   No results for input(s): "LIPASE", "AMYLASE" in the last 168 hours. CBC: Recent Labs  Lab 04/06/23 1238 04/06/23 1433 04/07/23 1159 04/08/23 0500 04/09/23 0955 04/12/23 0757  WBC 8.1  --  8.8 7.3 8.3 12.9*  HGB 7.8*   < > 7.9* 7.2* 8.1* 8.6*  HCT 25.5*   < > 26.1* 23.7* 26.7* 28.1*  MCV 74.6*  --  75.0* 76.0* 75.0* 74.9*  PLT 182  --  172 163 161 178   < > = values in this interval not displayed.   Blood Culture    Component Value Date/Time   SDES TISSUE 02/04/2023 1206   SDES WOUND 02/04/2023 1206   SPECREQUEST RIGHT FOOT 02/04/2023 1206   SPECREQUEST RIGHT FOOT 02/04/2023 1206   CULT  02/04/2023 1206    ABUNDANT STAPHYLOCOCCUS AUREUS MODERATE CORYNEBACTERIUM STRIATUM Standardized susceptibility testing for this organism is not available. NO ANAEROBES ISOLATED Performed at [facility], [address] [place], [place] [address]    CULT  02/04/2023 1206    ABUNDANT STAPHYLOCOCCUS AUREUS SUSCEPTIBILITIES PERFORMED ON PREVIOUS CULTURE WITHIN THE LAST 5 DAYS. MODERATE CORYNEBACTERIUM STRIATUM Standardized susceptibility testing for this organism is not available. NO ANAEROBES ISOLATED Performed at [facility], [address], [place], [place] [address]    REPTSTATUS 02/09/2023 FINAL 02/04/2023 1206   REPTSTATUS 02/09/2023 FINAL 02/04/2023 1206    Cardiac Enzymes: No results for input(s): "CKTOTAL", "CKMB", "CKMBINDEX", "TROPONINI" in the last 168 hours. CBG: Recent Labs  Lab 04/06/23 1630 04/12/23 0412  GLUCAP 75 79   Iron Studies: No results for input(s): "IRON", "TIBC", "TRANSFERRIN", "FERRITIN" in the last 72 hours. @lablastinr3 @ Studies/Results: No results found. Medications:  sodium chloride      (feeding supplement) PROSource Plus  30 mL Oral TID BM   amiodarone  200 mg Oral Daily   vitamin C  1,000 mg Oral Daily   aspirin EC  81 mg Oral Daily   atorvastatin  80 mg Oral QHS   bisacodyl  10 mg Rectal Once   calcitRIOL  2.25 mcg Oral Q T,Th,Sat-1800   calcium acetate  1,334 mg Oral  TID WC   carvedilol  12.5 mg Oral BID   Chlorhexidine Gluconate Cloth  6 each Topical Q0600   cinacalcet  60 mg Oral Q T,Th,Sat-1800   darbepoetin (ARANESP) injection - DIALYSIS  200 mcg Subcutaneous Q Tue-1800   dorzolamide-timolol  1 drop Right Eye BID   hydrocerin   Topical BID   levETIRAcetam  500 mg Oral Once per day on Sunday Monday Wednesday Friday   levETIRAcetam  750 mg Oral Once per day on Tuesday Thursday Saturday   lidocaine-prilocaine  1 Application Topical Once per day on Monday Wednesday Friday   multivitamin  1 tablet Oral QHS   nutrition supplement (JUVEN)  1 packet Oral BID BM   pneumococcal 20-valent conjugate vaccine  0.5 mL Intramuscular Tomorrow-1000   polyethylene glycol  17 g Oral BID   SMOG  960 mL Rectal Once   zinc  sulfate (50mg  elemental zinc)  220 mg Oral Daily     Dialysis Orders: [facility] TTS 3h  72kg  B400  LUA AVF  Heparin 1000+ 400 u/hr   Assessment/Plan: Right BKA with painful gangrenous wound: S/p right AKA from BKA 2/12 by [name]. Antibiotics per admit team/[name] Hyperkalemia-K+ 6.4 this AM. 1.0 K bath for 1 hour then switch to 2.0 K bath. Follow labs.  ESRD -HD TTS. Last HD 04/09/2023. Next HD 04/14/2023  General Weakness/debility: secondary #1 patient family unable to care for patient. Inpatient rehab consulted yesterday. SNF recommended Hyperkalemia: I advised following the proper diet and limiting outside foods to keep his K+ under control. Follow trend closely. May need to consider Lokelma but will hold off for now. K+ 5, awaiting new lab results HTN/volume: no excess volume on exam . BP okay. On midodrine 5 mg 3 times daily.  Anemia: Hgb now 8.6. On Aranesp to weekly (recently raised). Transfuse for Hgb < 7 if needed given recent procedure. Secondary hyperparathyroidism: corrected calcium okay, phosphorus 5.2, on calcitriol, Sensipar, and calcium acetate as binder Atrial fibrillation: on amiodarone and Coreg, no anticoag DMT2- per primary  Staci Carver H. Reese Stockman NP-C 04/12/2023, 9:22 AM  [employer] [phone]

## 2023-04-12 NOTE — TOC Transition Note (Signed)
Transition of Care [facility]) - Discharge Note   Patient Details  Name: [name]. MRN: [id number] Date of Birth: 1970/05/09  Transition of Care [facility]) CM/SW Contact:  Carley Hammed, LCSW Phone Number: 04/12/2023, 1:58 PM   Clinical Narrative:     Pt to be transported to [facility] rehab via [place]. Nurse to call report to [phone]. Rm# [phone]. HD confirmed with renal navigator.   Final next level of care: Skilled Nursing Facility Barriers to Discharge: Barriers Resolved   Patient Goals and CMS Choice Patient states their goals for this hospitalization and ongoing recovery are:: Dc to [facility] rehab if they will accept pt CMS Medicare.gov Compare Post Acute Care list provided to:: Patient Represenative (must comment) (Spouse- [name]) Choice offered to / list presented to : Spouse Martie Lee)      Discharge Placement              Patient chooses bed at: [facility] Patient to be transferred to facility by: [facility] Name of family member notified: [name] Patient and family notified of of transfer: 04/12/23  Discharge Plan and Services Additional resources added to the After Visit Summary for                                       Social Drivers of Health (SDOH) Interventions SDOH Screenings   Food Insecurity: Patient Declined (03/31/2023)  Housing: Patient Declined (03/31/2023)  Transportation Needs: Patient Declined (03/31/2023)  Utilities: Patient Declined (03/31/2023)  Financial Resource Strain: High Risk (07/05/2017)  Physical Activity: Inactive (07/05/2017)  Social Connections: Unknown (03/31/2023)  Stress: Stress Concern Present (07/05/2017)  Tobacco Use: Low Risk  (04/06/2023)  Health Literacy: Low Risk  (06/01/2020)   Received from [facility]     Readmission Risk Interventions    07/27/2021   11:49 AM 11/27/2020    3:06 PM  Readmission Risk Prevention Plan  Transportation Screening Complete Complete  PCP or Specialist Appt within 3-5  Days  Complete  HRI or Home Care Consult  Complete  Social Work Consult for Recovery Care Planning/Counseling  Complete  Palliative Care Screening  Not Applicable  Medication Review Oceanographer) Complete Complete  SW Recovery Care/Counseling Consult Complete   Palliative Care Screening Not Applicable   Skilled Nursing Facility Not Applicable

## 2023-04-12 NOTE — Progress Notes (Signed)
Received patient in bed to unit.  Alert and oriented.  Informed consent signed and in chart.   TX duration: 2 hours and 30 minutes.  Patient terminated session and signed AMA paperwork.  Nephrology team informed.  Patient tolerated well.  Transported back to the room  Alert, without acute distress.  Hand-off given to patient's nurse.   Access used: Left upper arm graft Access issues: none  Total UF removed: 1L Medication(s) given: none   04/12/23 1156  Vitals  Temp 98.5 F (36.9 C)  Temp Source Oral  BP (!) 135/109  BP Location Right Wrist  Pulse Rate 63  Resp 13  Oxygen Therapy  SpO2 100 %  O2 Device Room Air  Patient Activity (if Appropriate) In bed  Pulse Oximetry Type Continuous  During Treatment Monitoring  Dialysate Potassium Concentration 2  Dialysate Calcium Concentration 2.5  Duration of HD Treatment -hour(s) 2.5 hour(s)  Cumulative Fluid Removed (mL) per Treatment  1033.77  HD Safety Checks Performed Yes  Intra-Hemodialysis Comments See progress note (Patient wanted to come off early.  AMA paperwork signed.)  Dialysis Fluid Bolus Normal Saline  Bolus Amount (mL) 300 mL  Post Treatment  Dialyzer Clearance Lightly streaked  Liters Processed 59.9  Fluid Removed (mL) 1000 mL  Tolerated HD Treatment No (Comment)  Post-Hemodialysis Comments signed off 45 minutes early  AVG/AVF Arterial Site Held (minutes) 5 minutes  AVG/AVF Venous Site Held (minutes) 5 minutes  Fistula / Graft Left Upper arm  No placement date or time found.   Orientation: Left  Access Location: Upper arm  Status Deaccessed     Stacie Glaze LPN [facility]

## 2023-04-12 NOTE — Discharge Summary (Signed)
Physician Discharge Summary   Patient: [name]. MRN: [id number] DOB: 09-13-1970  Admit date:     03/24/2023  Discharge date: 04/12/23  Discharge Physician: Thad Ranger, MD    PCP: [name], FNP   Recommendations at discharge:    Next HD 04/14/2023  Outpatient follow-up with 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 male with ESRD on HD TTS, recent right BKA ([name]), CAD, CVA, paroxysmal atrial fibrillation not on anticoagulation due to history of ICH, gastroparesis, DM type II, diabetic retinopathy, glaucoma, history of left eye enucleation who presented to [name], ED on 1/30 via EMS from dialysis unit with complaints of increased blurriness, weakness, cough and difficulty managing at home.  Patient states symptoms worse with dialysis.  Recently discharged  from the hospital to SNF for 3 weeks and has been home over the last 10 days.  Patient reports is unable to take care of himself due to weakness and spouse working all the time.  Requesting placement.  MRA showed no intracranial large vessel occlusion or significant stenosis.  [name] discussed with ophthalmology on-call, [name] regarding his blurred vision in the setting of known diabetic retinopathy, glaucoma and history of left eye enucleation; ophthalmology recommended no other workup at this time and vision to be monitored and outpatient follow-up with his retina specialist.   [name] consulted for admission for further evaluation management of progressive weakness with TOC involvement for placement.  PT recommended SNF however insurance denied SNF authorization he is currently appealing the denial. He was transferred to [facility] for evaluation of the right BKA gangrene.    Assessment and Plan:   Generalized weakness/deconditioning and gait disturbances -Patient had recent prolonged hospitalization followed by subacute rehab after right BKA by [name] on 02/09/2023.   -PT evaluation recommended SNF, has new right AKA since 04/06/2023      Gangrene of the right foot status post BKA in the setting of peripheral vascular disease:  -Patient with recent BKA February 09, 2023 by [name].  Recently seen in outpatient office visit 03/22/2023 and was noted to have gangrenous changes to the RLE previous amputation site d -Dressing changes and wound care were recommended -Dr [name] was consulted, patient underwent right above-knee amputation with application of Prevena wound VAC on 04/06/2023  -  Pain controlled, continue current pain medication regimen and bowel regimen. -Outpatient follow-up with [name] in 1 week   Paroxysmal atrial fibrillation -Continue rate control with amiodarone, Coreg -Not on anticoagulation due to history of ICH     Diabetes mellitus type 2, with end-stage renal disease, peripheral  vascular disease -CBGs currently controlled -Not on insulin   Essential hypertension -Continue Coreg   Seizure disorder -Continue with Keppra 500 mg on Sunday Tuesday Thursday and Saturday and 750 mg daily on Monday Wednesday and Friday -No new seizures since admission.     Anemia of chronic disease -Baseline hemoglobin between 7-8 -Hemoglobin 8.1     ESRD on dialysis on TTS, hyperkalemia -Nephrology consulted, undergoing HD  -Receives outpatient dialysis at [facility]  -Underwent hemodialysis today but did not complete it.  Received 1.0K bath for 1 hour and then switched to 2.0K bath. -Cleared by nephrology to discharge to SNF, next HD on 04/14/2023     Autonomic dysfunction Continue midodrine    Pressure injury documentation Right thigh unstageable, POA Left heel, lateral, stage II, POA Left heel medial, deep tissue injury, POA -Wound care per nursing    Constipation -Continue MiraLAX, Colace, Refuses enema   Estimated body mass index is 21.82 kg/m as calculated from the following:   Height as of this encounter: 6\' 3" (1.905 m).   Weight as of this encounter: 79.2 kg.      Pain control - [place] Controlled Substance Reporting System database was reviewed. and patient was instructed, not to drive, operate heavy machinery, perform activities at heights, swimming or participation in water activities or provide baby-sitting services while on Pain, Sleep and Anxiety Medications; until their outpatient Physician has advised to do so again. Also recommended to not to take more than prescribed Pain, Sleep and Anxiety Medications.  Consultants: Nephrology, orthopedics Procedures performed: Right AKA Disposition: Skilled nursing facility Diet recommendation:  Discharge Diet Orders (From admission, onward)     Start     Ordered   04/12/23 0000  Diet - low sodium heart healthy        02 /18/25 1330            DISCHARGE MEDICATION: Allergies as  of 04/12/2023       Reactions   Promethazine Diarrhea, Other (See Comments)   Muscle cramps, cramping "all over"        Medication List     TAKE these medications    (feeding supplement) PROSource Plus liquid Take 30 mLs by mouth 3 (three) times daily between meals.   nutrition supplement (JUVEN) Pack Take 1 packet by mouth 2 (two) times daily between meals.   acetaminophen 325 MG tablet Commonly known as: TYLENOL Take 1-2 tablets (325-650 mg total) by mouth every 4 (four) hours as needed for mild pain.   amiodarone 200 MG tablet Commonly known as: PACERONE Take 1 tablet (200 mg total) by mouth every morning.   ascorbic acid 1000 MG tablet Commonly known as: VITAMIN C Take 1 tablet (1,000 mg total) by mouth daily for 14 days. Start taking on: April 13, 2023   aspirin EC 81 MG tablet Take 1 tablet (81 mg total) by mouth daily. Swallow whole.   atorvastatin 80 MG tablet Commonly known as: LIPITOR TAKE ONE TABLET BY MOUTH AT BEDTIME.   calcitRIOL 0.25 MCG capsule Commonly known as: ROCALTROL Take 9 capsules (2.25 mcg total) by mouth every Tuesday, Thursday, and Saturday at 6 PM.   calcium acetate 667 MG capsule  Commonly known as: PHOSLO Take 2 capsules (1,334 mg total) by mouth 3 (three) times daily with meals.   carvedilol 12.5 MG tablet Commonly known as: COREG Take 1 tablet (12.5 mg total) by mouth 2 (two) times daily.   cinacalcet 30 MG tablet Commonly known as: SENSIPAR Take 2 tablets (60 mg total) by mouth every Tuesday, Thursday, and Saturday at 6 PM.   docusate sodium 100 MG capsule Commonly known as: Colace Take 1 capsule (100 mg total) by mouth 2 (two) times daily.   dorzolamide-timolol 2-0.5 % ophthalmic solution Commonly known as: COSOPT Place 1 drop into the right eye 2 (two) times daily.   hydrocortisone 2.5 % rectal cream Commonly known as: ANUSOL-HC Place 1 Application rectally 2 (two) times daily.   levETIRAcetam 750 MG  tablet Commonly known as: KEPPRA Take 1 tablet (750 mg total) by mouth 3 (three) times a week. On Tuesday Thursday and Saturday, on dialysis days   levETIRAcetam 500 MG tablet Commonly known as: KEPPRA Take 1 tablet (500 mg total) by mouth 4 (four) times a week. On Sunday, Monday, Wednesday and Friday, all non-dialysis days   lidocaine-prilocaine cream Commonly known as: EMLA Apply 1 Application topically 3 (three) times a week.   loperamide 2 MG capsule Commonly known as: IMODIUM Take 1 capsule (2 mg total) by mouth as needed for diarrhea or loose stools.   midodrine 5 MG tablet Commonly known as: PROAMATINE Take 1 tablet (5 mg total) by mouth 3 (three) times daily with meals.   multivitamin Tabs tablet Take 1 tablet by mouth at bedtime.   ondansetron 4 MG tablet Commonly known as: ZOFRAN Take 1 tablet (4 mg total) by mouth every 6 (six) hours as needed for nausea or vomiting.   oxyCODONE 5 MG immediate release tablet Commonly known as: Oxy IR/ROXICODONE Take 1 tablet (5 mg total) by mouth every 6 (six) hours as needed for moderate pain (pain score 4-6) or severe pain (pain score 7-10).   polyethylene glycol 17 g packet Commonly known as: MIRALAX / GLYCOLAX Take 17 g by mouth daily.   zinc sulfate (50mg  elemental zinc) 220 (50 Zn) MG capsule Take 1 capsule (220 mg total) by mouth daily for 14 days. Start taking on: April 13, 2023               Discharge Care Instructions  (From admission, onward)           Start     Ordered   04/12/23 0000  Discharge wound care:       Comments: Dressing procedure/placement/frequency: Cleanse stump wound and left heel wounds with Vahse.  Apply VASHE moist gauze to wound bed, cover with dry gauze and kerlix/tape.  Change daily.   Apply Medihoney to the right  stump wound, top with saline moist gauze, and kerlix with ACE wrap.   04/12/23 1330            Contact information for follow-up providers     [name], MD Follow up in 1 week(s).   Specialty: Orthopedic Surgery Why: for hospital follow-up Contact information: [address] [place] [address] [phone]         [name], FNP. Schedule an appointment as soon as possible for a visit in 2 week(s).   Specialty: Nurse Practitioner Why: for hospital follow-up Contact information: [address] [place] [address] [phone]              Contact information  for after-discharge care     Destination     [facility] .   Service: Skilled Nursing Contact information: [address] [place] [address] [phone]                    Discharge Exam: Ceasar Mons Weights   04/08/23 1105 04/12/23 0858 04/12/23 1217  Weight: 79.2 kg 79.1 kg 78.1 kg   S: No acute complaints, did not sleep well, not very interactive today.  States does not want to be bothered.  BP 116/63   Pulse 63   Temp 98.5 F (36.9 C) (Oral)   Resp 12   Ht 6\' 3"  (1.905 m)   Wt 78.1 kg   SpO2 99%   BMI 21.52 kg/m   Physical Exam General: Alert and oriented x 3, NAD Cardiovascular: S1 S2 clear, RRR.  Respiratory: CTAB, no wheezing Gastrointestinal: Soft, nontender, nondistended, NBS Ext: right AKA, staples intact Neuro: no new deficits Psych: flat affect     Condition at discharge: fair  The results of significant diagnostics from this hospitalization (including imaging, microbiology, ancillary and laboratory) are listed below for reference.   Imaging Studies: MR FOOT LEFT WO CONTRAST Result Date: 04/05/2023 CLINICAL DATA:  Osteomyelitis, foot dry gangrene EXAM: MRI OF THE LEFT FOOT WITHOUT CONTRAST TECHNIQUE: Multiplanar, multisequence MR imaging of the left forefoot was performed. No intravenous contrast was administered. COMPARISON:  None recent. Left foot radiographs 05/25/2022 and 06/11/2016. MRI of the left ankle 08/04/2016. FINDINGS: Bones/Joint/Cartilage Status post  remote amputation of the great toe at the metatarsophalangeal joint. No significant abnormality of the 1st metatarsal head. 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 There are mild secondary degenerative changes. There is abnormal T2 hyperintensity within the 2nd proximal phalanx with the suggestion of a subacute fracture based on the short axis transverse axial images. No other significant marrow signal abnormalities identified in the forefoot. There is mild subchondral edema proximally in the navicular, likely reactive. Ligaments Intact Lisfranc ligament. The collateral ligaments of the remaining metatarsophalangeal joints appear intact. Muscles and Tendons Mild nonspecific generalized muscular atrophy and edema. There is a small amount of fluid surrounding the flexor digitorum longus tendons proximally in the forefoot. No significant distal tenosynovitis. Soft tissues Chronic linear metallic foreign body within the soft tissues lateral to the 5th metatarsal head with associated susceptibility artifact, unchanged from the most recent radiographs. This limits evaluation of the surrounding soft tissues. No obvious skin ulceration or focal fluid collection identified. IMPRESSION: 1. 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 2. Abnormal T2 hyperintensity within the 2nd proximal phalanx with the suggestion of a subacute fracture. Recommend plain film correlation. 3. No findings highly suspicious for osteomyelitis in the forefoot. Recommend clinical and radiographic correlation. 4. Chronic linear metallic foreign body within the soft tissues lateral to the 5th metatarsal head with associated susceptibility artifact, unchanged from the most recent radiographs. This limits evaluation of the surrounding soft tissues. No obvious skin ulceration or focal  fluid collection identified. 5. Mild nonspecific generalized muscular atrophy and edema. 6. Small amount of fluid surrounding the flexor digitorum longus tendons proximally in the forefoot, nonspecific. Electronically Signed   By: Carey Bullocks M.D.   On: 04/05/2023 08:39   MR ANGIO HEAD WO CONTRAST Result Date: 03/24/2023 CLINICAL DATA:  Vision loss, monocular EXAM: MRA HEAD WITHOUT CONTRAST  TECHNIQUE: Angiographic images of the Circle of Willis were acquired using MRA technique without intravenous contrast. COMPARISON:  03/17/2023 MRA FINDINGS: Anterior circulation: Both internal carotid arteries are patent to the termini, without significant stenosis. A1 segments patent. Normal anterior communicating artery. Possible focal stenosis in the left A3 (series 3, image 120) is favored to be artifactual given motion artifact in several vessels at that location; on the prior exam, there was no motion in this location and no stenosis. Anterior cerebral arteries are patent to their distal aspects without significant stenosis. No M1 stenosis or occlusion. Distal MCA branches perfused to their distal aspects without significant stenosis. Posterior circulation: Vertebral arteries patent to the vertebrobasilar junction without stenosis. Posterior inferior cerebral arteries patent bilaterally. Basilar patent to its distal aspect. Superior cerebellar arteries patent proximally. Patent P1 segments. PCAs perfused to their distal aspects without significant stenosis. The right posterior communicating artery is patent. Anatomic variants: None significant IMPRESSION: No intracranial large vessel occlusion or significant stenosis. Electronically Signed   By: Wiliam Ke M.D.   On: 03/24/2023 13:30   MR BRAIN WO CONTRAST Result Date: 03/17/2023 CLINICAL DATA:  dizziness and blurred vision EXAM: MRI HEAD WITHOUT CONTRAST TECHNIQUE: Multiplanar, multiecho pulse sequences of the brain and surrounding structures were obtained  without intravenous contrast. COMPARISON:  MRI head 03/01/2022. FINDINGS: Brain: Remote cortical infarcts in the high frontal lobes bilaterally and the right parietal lobe. Additional scattered T2/FLAIR hyperintensities in the white matter are nonspecific but compatible with chronic microvascular ischemic change. No convincing evidence of acute infarct. No acute hemorrhage, mass lesion, midline shift or hydrocephalus. Redemonstrated sequela prior hemorrhage include areas of superficial siderosis Vascular: Major arterial flow voids are maintained at the skull base. Skull and upper cervical spine: Normal marrow signal. Sinuses/Orbits: Mild paranasal sinus mucosal thickening. No acute orbital findings. Other: Trace mastoid effusions. IMPRESSION: 1. No evidence of acute intracranial abnormality. 2. Prior cortical infarcts and chronic microvascular ischemic changes. 3. 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 Electronically Signed   By: Feliberto Harts M.D.   On: 03/17/2023 17:28   MR ANGIO HEAD WO CONTRAST Result Date: 03/17/2023 CLINICAL DATA:  dizziness and blurred vision EXAM: MRI HEAD WITHOUT CONTRAST TECHNIQUE: Multiplanar, multiecho pulse sequences of the brain and surrounding structures were obtained without intravenous contrast. COMPARISON:  MRI head 03/01/2022. FINDINGS: Brain: Remote cortical infarcts in the high frontal lobes bilaterally and the right parietal lobe. Additional scattered T2/FLAIR hyperintensities in the white matter are nonspecific but compatible with chronic microvascular ischemic change. No convincing evidence of acute infarct. No acute hemorrhage, mass lesion, midline shift or hydrocephalus. Redemonstrated sequela prior hemorrhage include areas of superficial siderosis Vascular: Major arterial flow voids are maintained at the skull base. Skull and upper cervical spine: Normal marrow signal. Sinuses/Orbits: Mild paranasal sinus mucosal thickening. No acute orbital findings. Other:  Trace mastoid effusions. IMPRESSION: 1. No evidence of acute intracranial abnormality. 2. Prior cortical infarcts and chronic microvascular ischemic changes. 3. 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 Electronically Signed   By: Feliberto Harts M.D.   On: 03/17/2023 17:28   CT ANGIO HEAD NECK W WO CM Result Date: 03/17/2023 CLINICAL DATA:  Vertigo, dizziness, headache, blurred vision EXAM: CT ANGIOGRAPHY HEAD AND NECK WITH AND WITHOUT CONTRAST TECHNIQUE: Multidetector CT imaging of the head and neck was performed using the standard protocol during bolus administration of intravenous contrast. Multiplanar CT image reconstructions and MIPs were obtained to evaluate the vascular anatomy. Carotid stenosis measurements (when applicable) are obtained utilizing NASCET criteria, using the  distal internal carotid diameter as the denominator. RADIATION DOSE REDUCTION: This exam was performed according to the departmental dose-optimization program which includes automated exposure control, adjustment of the mA and/or kV according to patient size and/or use of iterative reconstruction technique. CONTRAST:  75mL OMNIPAQUE IOHEXOL 350 MG/ML SOLN COMPARISON:  03/01/2022 CTA head and neck, 02/07/2023 CT head FINDINGS: CT HEAD FINDINGS Brain: No evidence of acute infarct, hemorrhage, mass, mass effect, or midline shift. No hydrocephalus or extra-axial fluid collection. Vascular: No hyperdense vessel. Skull: Negative for fracture or focal lesion. Sinuses/Orbits: No acute finding.  Left globe prosthesis. Other: The mastoid air cells are well aerated. CTA HEAD AND NECK FINDINGS Evaluation is significantly limited by bolus timing, with the majority of the contrast refluxed into venous structures or not yet having reached the SVC. This study is essentially nondiagnostic with regards to the vasculature. Skeleton: No acute osseous abnormality. Degenerative changes in the cervical spine. Other neck: No acute finding. Upper chest:  No focal pulmonary opacity or pleural effusion. Review of the MIP images confirms the above findings IMPRESSION: 1. No acute intracranial process. 2. Evaluation of the vasculature is significantly limited by bolus timing, with the majority of the contrast refluxed into venous structures or not yet having reached the SVC. This study is essentially nondiagnostic with regards to the vasculature. If there is persistent clinical concern for an acute intracranial process, a repeat CTA head and neck is recommended. Electronically Signed   By: Wiliam Ke M.D.   On: 03/17/2023 16:02    Microbiology: Results for orders placed or performed during the hospital encounter of 03/24/23  Resp panel by RT-PCR (RSV, Flu A&B, Covid) Anterior Nasal Swab     Status: None   Collection Time: 03/24/23 10:04 AM   Specimen: Anterior Nasal Swab  Result Value Ref Range Status   SARS Coronavirus 2 by RT PCR NEGATIVE NEGATIVE Final    Comment: (NOTE) SARS-CoV-2 target nucleic acids are NOT DETECTED.  The SARS-CoV-2 RNA is generally detectable in upper respiratory specimens during the acute phase of infection. The lowest concentration of SARS-CoV-2 viral copies this assay can detect is 138 copies/mL. A negative result does not preclude SARS-Cov-2 infection and should not be used as the sole basis for treatment or other patient management decisions. A negative result may occur with  improper specimen collection/handling, submission of specimen other than nasopharyngeal swab, presence of viral mutation(s) within the areas targeted by this assay, and inadequate number of viral copies(<138 copies/mL). A negative result must be combined with clinical observations, patient history, and epidemiological information. The expected result is Negative.  Fact Sheet for Patients:  BloggerCourse.com  Fact Sheet for Healthcare Providers:  SeriousBroker.it  This test is no t yet  approved or cleared by the [place] FDA and  has been authorized for detection and/or diagnosis of SARS-CoV-2 by FDA under an Emergency Use Authorization (EUA). This EUA will remain  in effect (meaning this test can be used) for the duration of the COVID-19 declaration under Section 564(b)(1) of the Act, 21 U.S.C.section 360bbb-3(b)(1), unless the authorization is terminated  or revoked sooner.       Influenza A by PCR NEGATIVE NEGATIVE Final   Influenza B by PCR NEGATIVE NEGATIVE Final    Comment: (NOTE) The Xpert Xpress SARS-CoV-2/FLU/RSV plus assay is intended as an aid in the diagnosis of influenza from Nasopharyngeal swab specimens and should not be used as a sole basis for treatment. Nasal washings and aspirates are unacceptable for Xpert Xpress  SARS-CoV-2/FLU/RSV testing.  Fact Sheet for Patients: BloggerCourse.com  Fact Sheet for Healthcare Providers: SeriousBroker.it  This test is not yet approved or cleared by the [place] FDA and has been authorized for detection and/or diagnosis of SARS-CoV-2 by FDA under an Emergency Use Authorization (EUA). This EUA will remain in effect (meaning this test can be used) for the duration of the COVID-19 declaration under Section 564(b)(1) of the Act, 21 U.S.C. section 360bbb-3(b)(1), unless the authorization is terminated or revoked.     Resp Syncytial Virus by PCR NEGATIVE NEGATIVE Final    Comment: (NOTE) Fact Sheet for Patients: BloggerCourse.com  Fact Sheet for Healthcare Providers: SeriousBroker.it  This test is not yet approved or cleared by the [place] FDA and has been authorized for detection and/or diagnosis of SARS-CoV-2 by FDA under an Emergency Use Authorization (EUA). This EUA will remain in effect (meaning this test can be used) for the duration of the COVID-19 declaration under Section 564(b)(1) of  the Act, 21 U.S.C. section 360bbb-3(b)(1), unless the authorization is terminated or revoked.  Performed at [facility], [address], [place], [place] [address]   Gastrointestinal Panel by PCR , Stool     Status: None   Collection Time: 03/25/23  3:49 AM   Specimen: Stool  Result Value Ref Range Status   Campylobacter species NOT DETECTED NOT DETECTED Final   Plesimonas shigelloides NOT DETECTED NOT DETECTED Final   Salmonella species NOT DETECTED NOT DETECTED Final   Yersinia enterocolitica NOT DETECTED NOT DETECTED Final   Vibrio species NOT DETECTED NOT DETECTED Final   Vibrio cholerae NOT DETECTED NOT DETECTED Final   Enteroaggregative E coli (EAEC) NOT DETECTED NOT DETECTED Final   Enteropathogenic E coli (EPEC) NOT DETECTED NOT DETECTED Final   Enterotoxigenic E coli (ETEC) NOT DETECTED NOT DETECTED Final   Shiga like toxin producing E coli (STEC) NOT DETECTED NOT DETECTED Final   Shigella/Enteroinvasive E coli (EIEC) NOT DETECTED NOT DETECTED Final   Cryptosporidium NOT DETECTED NOT DETECTED Final   Cyclospora cayetanensis NOT DETECTED NOT DETECTED Final   Entamoeba histolytica NOT DETECTED NOT DETECTED Final   Giardia lamblia NOT DETECTED NOT DETECTED Final   Adenovirus F40/41 NOT DETECTED NOT DETECTED Final   Astrovirus NOT DETECTED NOT DETECTED Final   Norovirus GI/GII NOT DETECTED NOT DETECTED Final   Rotavirus A NOT DETECTED NOT DETECTED Final   Sapovirus (I, II, IV, and V) NOT DETECTED NOT DETECTED Final    Comment: Performed at [facility], [address] [place], [place] [address]  C Difficile Quick Screen w PCR reflex     Status: None   Collection Time: 03/25/23  3:50 AM   Specimen: Stool  Result Value Ref Range Status   C Diff antigen NEGATIVE NEGATIVE Final   C Diff toxin NEGATIVE NEGATIVE Final   C Diff interpretation No C. difficile detected.  Final    Comment: Performed at [facility], [address], [place], [place] [address]  Surgical PCR  screen     Status: None   Collection Time: 04/01/23  6:05 PM   Specimen: Nasal Mucosa; Nasal Swab  Result Value Ref Range Status   MRSA, PCR NEGATIVE NEGATIVE Final   Staphylococcus aureus NEGATIVE NEGATIVE Final    Comment: (NOTE) The Xpert SA Assay (FDA approved for NASAL specimens in patients 48 years of age and older), is one component of a comprehensive surveillance program. It is not intended to diagnose infection nor to guide or monitor treatment. Performed  at [facility], [address], [place], [place] [address]     Labs: CBC: Recent Labs  Lab 04/06/23 1238 04/06/23 1433 04/07/23 1159 04/08/23 0500 04/09/23 0955 04/12/23 0757  WBC 8.1  --  8.8 7.3 8.3 12.9*  HGB 7.8* 8.8* 7.9* 7.2* 8.1* 8.6*  HCT 25.5* 26.0* 26.1* 23.7* 26.7* 28.1*  MCV 74.6*  --  75.0* 76.0* 75.0* 74.9*  PLT 182  --  172 163 161 178   Basic Metabolic Panel: Recent Labs  Lab 04/06/23 1433 04/07/23 1159 04/08/23 0500 04/09/23 0955 04/12/23 0757  NA 134* 135 137 137 138  K 4.5 4.6 5.1 5.0 6.4*  CL 97* 96* 99 99 99  CO2  --  21* 23 25 20*  GLUCOSE 80 109* 114* 88 78  BUN 105* 123* 151* 101* 167*  CREATININE 10.10* 10.62* 11.69* 8.82* 12.34*  CALCIUM  --  8.5* 8.7* 9.1 9.4  PHOS  --   --   --   --  8.4*   Liver Function Tests: Recent Labs  Lab 04/12/23 0757  ALBUMIN 2.5*   CBG: Recent Labs  Lab 04/06/23 1630 04/12/23 0412  GLUCAP 75 79    Discharge time spent: greater than 30 minutes.  Signed: Thad Ranger, MD [facility] 04/12/2023

## 2023-04-12 NOTE — Plan of Care (Signed)
  Problem: Clinical Measurements: Goal: Ability to maintain clinical measurements within normal limits will improve Outcome: Progressing Goal: Diagnostic test results will improve Outcome: Progressing Goal: Respiratory complications will improve Outcome: Progressing Goal: Cardiovascular complication will be avoided Outcome: Progressing   Problem: Activity: Goal: Risk for activity intolerance will decrease Outcome: Progressing   Problem: Nutrition: Goal: Adequate nutrition will be maintained Outcome: Progressing   Problem: Coping: Goal: Level of anxiety will decrease Outcome: Progressing   Problem: Elimination: Goal: Will not experience complications related to bowel motility Outcome: Progressing Goal: Will not experience complications related to urinary retention Outcome: Progressing   Problem: Pain Managment: Goal: General experience of comfort will improve and/or be controlled Outcome: Progressing   Problem: Safety: Goal: Ability to remain free from injury will improve Outcome: Progressing   Problem: Skin Integrity: Goal: Risk for impaired skin integrity will decrease Outcome: Progressing   Problem: Education: Goal: Knowledge of disease and its progression will improve Outcome: Progressing Goal: Individualized Educational Video(s) Outcome: Progressing   Problem: Fluid Volume: Goal: Compliance with measures to maintain balanced fluid volume will improve Outcome: Progressing   Problem: Health Behavior/Discharge Planning: Goal: Ability to manage health-related needs will improve Outcome: Progressing   Problem: Nutritional: Goal: Ability to make healthy dietary choices will improve Outcome: Progressing   Problem: Clinical Measurements: Goal: Complications related to the disease process, condition or treatment will be avoided or minimized Outcome: Progressing   Problem: Education: Goal: Knowledge of the prescribed therapeutic regimen will improve Outcome:  Progressing Goal: Ability to verbalize activity precautions or restrictions will improve Outcome: Progressing Goal: Understanding of discharge needs will improve Outcome: Progressing   Problem: Activity: Goal: Ability to perform//tolerate increased activity and mobilize with assistive devices will improve Outcome: Progressing   Problem: Clinical Measurements: Goal: Postoperative complications will be avoided or minimized Outcome: Progressing   Problem: Self-Care: Goal: Ability to meet self-care needs will improve Outcome: Progressing   Problem: Self-Concept: Goal: Ability to maintain and perform role responsibilities to the fullest extent possible will improve Outcome: Progressing   Problem: Pain Management: Goal: Pain level will decrease with appropriate interventions Outcome: Progressing   Problem: Skin Integrity: Goal: Demonstration of wound healing without infection will improve Outcome: Progressing

## 2023-04-13 DIAGNOSIS — R2689 Other abnormalities of gait and mobility: Secondary | ICD-10-CM

## 2023-04-13 DIAGNOSIS — R5381 Other malaise: Secondary | ICD-10-CM | POA: Diagnosis not present

## 2023-04-13 DIAGNOSIS — E1122 Type 2 diabetes mellitus with diabetic chronic kidney disease: Secondary | ICD-10-CM

## 2023-04-13 DIAGNOSIS — E1151 Type 2 diabetes mellitus with diabetic peripheral angiopathy without gangrene: Secondary | ICD-10-CM

## 2023-04-13 DIAGNOSIS — G40909 Epilepsy, unspecified, not intractable, without status epilepticus: Secondary | ICD-10-CM

## 2023-04-13 DIAGNOSIS — K5900 Constipation, unspecified: Secondary | ICD-10-CM

## 2023-04-13 DIAGNOSIS — M6281 Muscle weakness (generalized): Secondary | ICD-10-CM

## 2023-04-13 DIAGNOSIS — D638 Anemia in other chronic diseases classified elsewhere: Secondary | ICD-10-CM | POA: Diagnosis not present

## 2023-04-13 DIAGNOSIS — R531 Weakness: Secondary | ICD-10-CM | POA: Diagnosis not present

## 2023-04-13 DIAGNOSIS — Z89511 Acquired absence of right leg below knee: Secondary | ICD-10-CM

## 2023-04-13 DIAGNOSIS — H538 Other visual disturbances: Secondary | ICD-10-CM | POA: Diagnosis not present

## 2023-04-13 LAB — RENAL FUNCTION PANEL
Albumin: 2.4 g/dL — ABNORMAL LOW (ref 3.5–5.0)
Anion gap: 16 — ABNORMAL HIGH (ref 5–15)
BUN: 111 mg/dL — ABNORMAL HIGH (ref 6–20)
CO2: 23 mmol/L (ref 22–32)
Calcium: 9 mg/dL (ref 8.9–10.3)
Chloride: 96 mmol/L — ABNORMAL LOW (ref 98–111)
Creatinine, Ser: 9.04 mg/dL — ABNORMAL HIGH (ref 0.61–1.24)
GFR, Estimated: 6 mL/min — ABNORMAL LOW (ref >60)
Glucose, Bld: 76 mg/dL (ref 70–99)
Phosphorus: 7 mg/dL — ABNORMAL HIGH (ref 2.5–4.6)
Potassium: 5.3 mmol/L — ABNORMAL HIGH (ref 3.5–5.1)
Sodium: 135 mmol/L (ref 135–145)

## 2023-04-13 LAB — CBC
HCT: 27.3 % — ABNORMAL LOW (ref 39.0–52.0)
Hemoglobin: 8.4 g/dL — ABNORMAL LOW (ref 13.0–17.0)
MCH: 23 pg — ABNORMAL LOW (ref 26.0–34.0)
MCHC: 30.8 g/dL (ref 30.0–36.0)
MCV: 74.6 fL — ABNORMAL LOW (ref 80.0–100.0)
Platelets: 163 10*3/uL (ref 150–400)
RBC: 3.66 MIL/uL — ABNORMAL LOW (ref 4.22–5.81)
RDW: 20.3 % — ABNORMAL HIGH (ref 11.5–15.5)
WBC: 11.8 10*3/uL — ABNORMAL HIGH (ref 4.0–10.5)
nRBC: 0 % (ref 0.0–0.2)

## 2023-04-13 MED ORDER — LACTULOSE 10 GM/15ML PO SOLN
20.0000 g | Freq: Once | ORAL | Status: AC
  Administered 2023-04-13: 20 g via ORAL
  Filled 2023-04-13: qty 30

## 2023-04-13 MED ORDER — SODIUM ZIRCONIUM CYCLOSILICATE 10 G PO PACK
10.0000 g | PACK | Freq: Once | ORAL | Status: AC
  Administered 2023-04-13: 10 g via ORAL
  Filled 2023-04-13: qty 1

## 2023-04-13 NOTE — Progress Notes (Signed)
Physical Therapy Treatment Patient Details Name: [name]. MRN: [id number] DOB: [birth year] Today's Date: 04/13/2023   History of Present Illness The pt is a 53 yo male presenting 1/30 from HD with blurriness and weakness since BKA on 02/09/23. Admitted to [facility] for FTT, inability to manage at home, transferred to [facility] 2/6 for surgical debridement. 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 PMH includes:  ESRD on HD TTS, recent right BKA ([name]), CAD, CVA, paroxysmal atrial fibrillation not on anticoagulation due to history of ICH, gastroparesis, type 2 diabetes mellitus, diabetic retinopathy, glaucoma, and history of left eye enucleation.    PT Comments  Pt seen for PT tx with pt received in bed, initially talking with PT but once PT informs pt [name] here for therapy, pt with decreased engagement & eyes closed requiring ongoing verbal conversation to increase pt's engagement. Pt requires max assist for supine>sit, max<>total assist for lateral scoot from elevated EOB to drop arm recliner. Pt with very poor core/trunk control with frequent LOB with poor ability to correct. Once in recliner, PT attempted to engage pt in reaching anteriorly to focus on core/trunk strengthening with pt only engaging in a couple attempts before demonstrating worsening initiation/engagement. Pt would benefit from ongoing PT services to address strengthening, balance, bed mobility & transfers.   If plan is discharge home, recommend the following: Two people to help with walking and/or transfers;Two people to help with bathing/dressing/bathroom;Direct supervision/assist for medications management;Help with stairs or ramp for entrance;Assist for transportation;Assistance with cooking/housework;Direct supervision/assist for financial management;Supervision due to cognitive status   Can travel by private vehicle     No  Equipment Recommendations  Wheelchair (measurements PT);Wheelchair cushion (measurements PT);Hoyer lift    Recommendations for  [facility] consult     Precautions / Restrictions Precautions Precautions: Fall Precaution/Restrictions Comments: R AKA Restrictions Weight Bearing Restrictions Per Provider Order: No     Mobility  Bed Mobility Overal bed mobility: Needs Assistance       Supine to sit: Max assist, HOB elevated, Used rails (pt minimally assists with moving BLE to EOB & uprighting trunk, pt also with decreased core/trunk, UE strength, requires max assist to transition to sitting EOB)          Transfers Overall transfer level: Needs assistance   Transfers: Bed to chair/wheelchair/BSC            Lateral/Scoot Transfers: Total assist, Max assist, From elevated surface General transfer comment: Pt transfers bed>recliner via lateral scoot from elevated to lower surface, PT provides cuing & pt minimally able to assist with scooting. Pt with poor awareness of head/hips relationship, poor anterior weight shift to increase ease of transfer, & decreased ability to push with LLE, BUE.    Ambulation/Gait                   Stairs             Wheelchair Mobility     Tilt Bed    Modified Rankin (Stroke Patients Only)       Balance Overall balance assessment: Needs assistance Sitting-balance support: Feet supported, Bilateral upper extremity supported Sitting balance-Leahy Scale: Poor Sitting balance - Comments: Pt with poor sitting balance, frequent posterior LOB with total assist to correct as pt with absent righting reactions even though pt reports he can feel himself falling backwards.  Communication Communication Communication: No apparent difficulties  Cognition Arousal: Alert Behavior During Therapy: Flat affect   PT - Cognitive impairments: No family/caregiver present to determine baseline, Memory, Awareness, Attention, Initiation, Sequencing, Problem solving, Safety/Judgement                        PT - Cognition Comments: Pt with very poor initiation, low frustration tolerance, decreased awareness, delayed responses, requires multimodal cuing throughout session, will state "you've gotta give me time" even when already given extra time for simple tasks. Following commands: Impaired Following commands impaired: Follows one step commands inconsistently, Follows one step commands with increased time    Cueing Cueing Techniques: Verbal cues, Tactile cues, Visual cues  Exercises      General Comments        Pertinent Vitals/Pain Pain Assessment Pain Assessment: Faces Faces Pain Scale: No hurt    Home Living                          Prior Function            PT Goals (current goals can now be found in the care plan section) Acute Rehab PT Goals Patient Stated Goal: to be able to complete lateral transfer PT Goal Formulation: With patient Time For Goal Achievement: 04/21/23 Potential to Achieve Goals: Fair Progress towards PT goals: Progressing toward goals    Frequency    Min 1X/week      PT Plan      Co-evaluation              AM-PAC PT "6 Clicks" Mobility   Outcome Measure  Help needed turning from your back to your side while in a flat bed without using bedrails?: A Lot Help needed moving from lying on your back to sitting on the side of a flat bed without using bedrails?: Total Help needed moving to and from a bed to a chair (including a wheelchair)?: Total Help needed standing up from a chair using your arms (e.g., wheelchair or bedside chair)?: Total Help needed to walk in hospital room?: Total Help needed climbing 3-5 steps with a railing? : Total 6 Click Score: 7    End of Session   Activity Tolerance: Patient limited by fatigue Patient left: in chair;with chair alarm set;with call bell/phone within reach   PT Visit Diagnosis: Unsteadiness on feet (R26.81);Other abnormalities of gait and mobility (R26.89);Muscle weakness  (generalized) (M62.81)     Time: 1610-9604 PT Time Calculation (min) (ACUTE ONLY): 15 min  Charges:    $Therapeutic Activity: 8-22 mins PT General Charges $$ ACUTE PT VISIT: 1 Visit                     Aleda Grana, PT, DPT 04/13/23, 9:39 AM  Sandi Mariscal 04/13/2023, 9:37 AM

## 2023-04-13 NOTE — Progress Notes (Signed)
[facility]                                                                              [name], is a 53 y.o. male, DOB - 06-Aug-1970, EPP:[id number] Admit date - 03/24/2023    Outpatient Primary MD for the patient is Tomasita Crumble T, FNP  LOS - 9  days  Chief Complaint  Patient presents with   Weakness       Brief summary   Patient is a 53 y.o. male with ESRD on HD TTS, recent right BKA ([name]), CAD, CVA, paroxysmal atrial fibrillation not on anticoagulation due to history of ICH, gastroparesis, DM type II, diabetic retinopathy, glaucoma, history of left eye enucleation who presented to [name], ED on 1/30 via EMS from dialysis unit with complaints of increased blurriness, weakness, cough and difficulty managing at home.  Patient states symptoms worse with dialysis.  Recently discharged from the hospital to SNF for 3 weeks and has been home over the last 10 days.  Patient reports is unable to take care of himself due to weakness and spouse working all the time.  Requesting placement.  MRA showed no intracranial large vessel occlusion or significant stenosis.  [name] discussed with ophthalmology on-call, [name] regarding his blurred vision in the setting of known diabetic retinopathy, glaucoma and history of left eye enucleation; ophthalmology recommended no other workup at this time and vision to be monitored and outpatient follow-up with his retina specialist.   [name] consulted for admission for further evaluation management of progressive weakness with TOC involvement for placement.  PT recommended SNF however insurance denied SNF authorization he is currently appealing the denial. He was transferred to [facility] for evaluation of the right BKA gangrene.  Plan for AKA on Wednesday  Discharge held on 2/18 as SNF could not provide transportation to outpatient center due to inclement weather  Assessment & Plan   Generalized weakness/deconditioning and gait  disturbances -Patient had recent prolonged hospitalization followed by subacute rehab after right BKA by [name] on 02/09/2023.   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.  Recently seen in outpatient office visit 03/22/2023 and was noted to have gangrenous changes to the RLE previous amputation site d -Dressing changes and wound care were recommended -Dr [name] was consulted, patient underwent right above-knee amputation with application of Prevena wound VAC on 04/06/2023  -Pain controlled, continue current pain medication regimen and bowel regimen. -Outpatient follow-up with [name] in 1 week   Paroxysmal atrial fibrillation -Continue rate control with amiodarone, Coreg -Not on anticoagulation due to history of ICH    Diabetes mellitus type 2, with end-stage renal disease, peripheral vascular disease -CBGs currently controlled -Not on insulin   Essential hypertension -Continue Coreg   Seizure disorder -Continue with Keppra 500 mg on Sunday Tuesday Thursday and Saturday and 750 mg daily on Monday Wednesday and Friday -No new seizures since admission.  Anemia of chronic disease -Baseline hemoglobin between 7-8 -H&H stable, 8.4     ESRD on dialysis on TTS, hyperkalemia -Nephrology consulted, undergoing HD  -Receives outpatient dialysis at [facility] clinic  -Continue HD per schedule.  Discharge to SNF on hold as SNF unable to provide transportation to the HD center due to increment weather  -Receiving [facility] for K5.3, last HD on 2/18   Autonomic dysfunction Continue midodrine    Pressure injury documentation Right thigh unstageable, POA Left heel, lateral, stage II, POA Left heel medial, deep tissue injury, POA -Wound care per nursing    Constipation -Continue MiraLAX,  Colace, Refuses enema.  Last BM on 2/16  -Lactulose 20 g p.o. x 1    Estimated body mass index is 21.52 kg/m as calculated from the following:   Height as of this encounter: 6\' 3"  (1.905 m).   Weight as of this encounter: 78.1 kg.  Code Status: Full code DVT Prophylaxis:  SCD's Start: 04/06/23 1738 SCDs Start: 03/24/23 2107   Level of Care: Level of care: Med-Surg Family Communication:  Disposition Plan:      Remains inpatient appropriate SNF likely tomorrow after HD   Procedures:    Consultants:   Orthopedics  Antimicrobials:   Anti-infectives (From admission, onward)    Start     Dose/Rate Route Frequency Ordered Stop   04/06/23 0600  ceFAZolin (ANCEF) IVPB 2g/100 mL premix        2 g 200 mL/hr over 30 Minutes Intravenous On call to O.R. 04/05/23 1855 04/06/23 1547          Medications  (feeding supplement) PROSource Plus  30 mL Oral TID BM   amiodarone  200 mg Oral Daily   vitamin C  1,000 mg Oral Daily   aspirin EC  81 mg Oral Daily   atorvastatin  80 mg Oral QHS   bisacodyl  10 mg Rectal Once   calcitRIOL  2.25 mcg Oral Q T,Th,Sat-1800   calcium acetate  1,334 mg Oral TID WC   carvedilol  12.5 mg Oral BID   Chlorhexidine Gluconate Cloth  6 each Topical Q0600   cinacalcet  60 mg Oral Q T,Th,Sat-1800   darbepoetin (ARANESP) injection - DIALYSIS  200 mcg Subcutaneous Q Tue-1800   dorzolamide-timolol  1 drop Right Eye BID   hydrocerin   Topical BID   levETIRAcetam  500 mg Oral Once per day on Sunday Monday Wednesday Friday   levETIRAcetam  750 mg Oral Once per day on Tuesday Thursday Saturday   lidocaine-prilocaine  1 Application Topical Once per day on Monday Wednesday Friday   multivitamin  1 tablet Oral QHS   nutrition supplement (JUVEN)  1 packet Oral BID BM   pneumococcal 20-valent conjugate vaccine  0.5 mL Intramuscular Tomorrow-1000   polyethylene glycol  17 g Oral BID   SMOG  960 mL Rectal Once   zinc sulfate (50mg  elemental zinc)  220 mg Oral  Daily      Subjective:   [name] was seen and examined today.  No acute complaints, awaiting discharge to SNF.  No chest pain, shortness of breath, fevers, nausea or vomiting.  Objective:   Vitals:   04/12/23 1532 04/12/23 2029 04/13/23 0401 04/13/23 0744  BP: (!) 168/73 123/68 121/60 (!) 104/40  Pulse: 66 61 61 64  Resp: 18 18 18 18   Temp: 98.5 F (36.9 C) 98.3 F (36.8 C) 98.2 F (36.8 C) 98.7 F (37.1 C)  TempSrc: Oral Oral Oral Oral  SpO2: 93% 98% 96% 98%  Weight:      Height:        Intake/Output Summary (Last 24 hours) at 04/13/2023 1101 Last data filed at 04/12/2023 2229 Gross per 24 hour  Intake --  Output 1001 ml  Net -1001 ml      Wt Readings from Last 3 Encounters:  04/12/23 78.1 kg  03/17/23 79.4 kg  02/28/23 80.4 kg    Physical Exam General: Alert and oriented x 3, NAD Cardiovascular: S1 S2 clear, RRR.  Respiratory: CTAB, no wheezing Gastrointestinal: Soft, nontender, nondistended, NBS Ext: right AKA dressing intact Neuro: no new deficits Psych: flat affect    Data Reviewed:  I have personally reviewed following labs    CBC Lab Results  Component Value Date   WBC 11.8 (H) 04/13/2023   RBC 3.66 (L) 04/13/2023   HGB 8.4 (L) 04/13/2023   HCT 27.3 (L) 04/13/2023   MCV 74.6 (L) 04/13/2023   MCH 23.0 (L) 04/13/2023   PLT 163 04/13/2023   MCHC 30.8 04/13/2023   RDW 20.3 (H) 04/13/2023   LYMPHSABS 1.4 04/05/2023   MONOABS 0.8 04/05/2023   EOSABS 0.7 (H) 04/05/2023   BASOSABS 0.1 04/05/2023     Last metabolic panel Lab Results  Component Value Date   NA 135 04/13/2023   K 5.3 (H) 04/13/2023   CL 96 (L) 04/13/2023   CO2 23 04/13/2023   BUN 111 (H) 04/13/2023   CREATININE 9.04 (H) 04/13/2023   GLUCOSE 76 04/13/2023   GFRNONAA 6 (L) 04/13/2023   GFRAA 5 (L) 08/27/2019   CALCIUM 9.0 04/13/2023   PHOS 7.0 (H) 04/13/2023   PROT 6.4 (L) 03/24/2023   ALBUMIN 2.4 (L) 04/13/2023   LABGLOB 2.9 08/19/2016   AGRATIO 0.9  08/19/2016   BILITOT 0.7 03/24/2023   ALKPHOS 62 03/24/2023   AST 13 (L) 03/24/2023   ALT 14 03/24/2023   ANIONGAP 16 (H) 04/13/2023    CBG (last 3)  Recent Labs    04/12/23 0412  GLUCAP 79       Coagulation Profile: No results for input(s): "INR", "PROTIME" in the last 168 hours.   Radiology Studies: I have personally reviewed the imaging studies  No results found.      Thad Ranger M.D. [facility] 04/13/2023, 11:01 AM  Available via Epic secure chat 7am-7pm After 7 pm, please refer to night coverage provider listed on amion.

## 2023-04-13 NOTE — Progress Notes (Signed)
Contacted [facility] this morning to advise staff that snf does not have transportation to pt's appt tomorrow (transportation has canceled transportation for tomorrow due to weather). Clinic aware pt will not be at treatment tomorrow for this reason. Update provided to renal NP as well.   Olivia Canter Renal Navigator [phone]

## 2023-04-13 NOTE — Plan of Care (Signed)
  Problem: Clinical Measurements: Goal: Ability to maintain clinical measurements within normal limits will improve Outcome: Progressing Goal: Diagnostic test results will improve Outcome: Progressing Goal: Respiratory complications will improve Outcome: Progressing Goal: Cardiovascular complication will be avoided Outcome: Progressing   Problem: Activity: Goal: Risk for activity intolerance will decrease Outcome: Progressing   Problem: Nutrition: Goal: Adequate nutrition will be maintained Outcome: Progressing   Problem: Coping: Goal: Level of anxiety will decrease Outcome: Progressing   Problem: Elimination: Goal: Will not experience complications related to bowel motility Outcome: Progressing Goal: Will not experience complications related to urinary retention Outcome: Progressing   Problem: Pain Managment: Goal: General experience of comfort will improve and/or be controlled Outcome: Progressing   Problem: Safety: Goal: Ability to remain free from injury will improve Outcome: Progressing   Problem: Skin Integrity: Goal: Risk for impaired skin integrity will decrease Outcome: Progressing   Problem: Education: Goal: Knowledge of disease and its progression will improve Outcome: Progressing Goal: Individualized Educational Video(s) Outcome: Progressing   Problem: Fluid Volume: Goal: Compliance with measures to maintain balanced fluid volume will improve Outcome: Progressing   Problem: Health Behavior/Discharge Planning: Goal: Ability to manage health-related needs will improve Outcome: Progressing   Problem: Nutritional: Goal: Ability to make healthy dietary choices will improve Outcome: Progressing   Problem: Clinical Measurements: Goal: Complications related to the disease process, condition or treatment will be avoided or minimized Outcome: Progressing   Problem: Education: Goal: Knowledge of the prescribed therapeutic regimen will improve Outcome:  Progressing Goal: Ability to verbalize activity precautions or restrictions will improve Outcome: Progressing Goal: Understanding of discharge needs will improve Outcome: Progressing   Problem: Activity: Goal: Ability to perform//tolerate increased activity and mobilize with assistive devices will improve Outcome: Progressing   Problem: Clinical Measurements: Goal: Postoperative complications will be avoided or minimized Outcome: Progressing   Problem: Self-Care: Goal: Ability to meet self-care needs will improve Outcome: Progressing   Problem: Self-Concept: Goal: Ability to maintain and perform role responsibilities to the fullest extent possible will improve Outcome: Progressing   Problem: Pain Management: Goal: Pain level will decrease with appropriate interventions Outcome: Progressing   Problem: Skin Integrity: Goal: Demonstration of wound healing without infection will improve Outcome: Progressing

## 2023-04-13 NOTE — Progress Notes (Signed)
[name] Progress Note   Subjective: Supposed to DC to SNF and resume care at OP HD unit 04/14/2023.  K+ 5.3 this AM. Lokelma ordered.   Objective Vitals:   04/12/23 1532 04/12/23 2029 04/13/23 0401 04/13/23 0744  BP: (!) 168/73 123/68 121/60 (!) 104/40  Pulse: 66 61 61 64  Resp: 18 18 18 18   Temp: 98.5 F (36.9 C) 98.3 F (36.8 C) 98.2 F (36.8 C) 98.7 F (37.1 C)  TempSrc: Oral Oral Oral Oral  SpO2: 93% 98% 96% 98%  Weight:      Height:       Physical Exam General: Chronically ill appearing male in NAD Heart: S1,S2 RRR No M/R/G Lungs: CTAB No WOB Abdomen:NABS, NT Extremities: R AKA LLE no pedal edema Dialysis Access: LUA AVG + T/B    Additional Objective Labs: Basic Metabolic Panel: Recent Labs  Lab 04/09/23 0955 04/12/23 0757 04/13/23 0734  NA 137 138 135  K 5.0 6.4* 5.3*  CL 99 99 96*  CO2 25 20* 23  GLUCOSE 88 78 76  BUN 101* 167* 111*  CREATININE 8.82* 12.34* 9.04*  CALCIUM 9.1 9.4 9.0  PHOS  --  8.4* 7.0*   Liver Function Tests: Recent Labs  Lab 04/12/23 0757 04/13/23 0734  ALBUMIN 2.5* 2.4*   No results for input(s): "LIPASE", "AMYLASE" in the last 168 hours. CBC: Recent Labs  Lab 04/07/23 1159 04/08/23 0500 04/09/23 0955 04/12/23 0757 04/13/23 0734  WBC 8.8 7.3 8.3 12.9* 11.8*  HGB 7.9* 7.2* 8.1* 8.6* 8.4*  HCT 26.1* 23.7* 26.7* 28.1* 27.3*  MCV 75.0* 76.0* 75.0* 74.9* 74.6*  PLT 172 163 161 178 163   Blood Culture    Component Value Date/Time   SDES TISSUE 02/04/2023 1206   SDES WOUND 02/04/2023 1206   SPECREQUEST RIGHT FOOT 02/04/2023 1206   SPECREQUEST RIGHT FOOT 02/04/2023 1206   CULT  02/04/2023 1206    ABUNDANT STAPHYLOCOCCUS AUREUS MODERATE CORYNEBACTERIUM STRIATUM Standardized susceptibility testing for this organism is not available. NO ANAEROBES ISOLATED Performed at [facility], [address], [place], [place] [address]    CULT  02/04/2023 1206    ABUNDANT STAPHYLOCOCCUS  AUREUS SUSCEPTIBILITIES PERFORMED ON PREVIOUS CULTURE WITHIN THE LAST 5 DAYS. MODERATE CORYNEBACTERIUM STRIATUM Standardized susceptibility testing for this organism is not available. NO ANAEROBES ISOLATED Performed at [facility], [address] [place], [place] [address]    REPTSTATUS 02/09/2023 FINAL 02/04/2023 1206   REPTSTATUS 02/09/2023 FINAL 02/04/2023 1206    Cardiac Enzymes: No results for input(s): "CKTOTAL", "CKMB", "CKMBINDEX", "TROPONINI" in the last 168 hours. CBG: Recent Labs  Lab 04/06/23 1630 04/12/23 0412  GLUCAP 75 79   Iron Studies: No results for input(s): "IRON", "TIBC", "TRANSFERRIN", "FERRITIN" in the last 72 hours. @lablastinr3 @ Studies/Results: No results found. Medications:  sodium chloride      (feeding supplement) PROSource Plus  30 mL Oral TID BM   amiodarone  200 mg Oral Daily   vitamin C  1,000 mg Oral Daily   aspirin EC  81 mg Oral Daily   atorvastatin  80 mg Oral QHS   bisacodyl  10 mg Rectal Once   calcitRIOL  2.25 mcg Oral Q T,Th,Sat-1800   calcium acetate  1,334 mg Oral TID WC   carvedilol  12.5 mg Oral BID   Chlorhexidine Gluconate Cloth  6 each Topical Q0600   cinacalcet  60 mg Oral Q T,Th,Sat-1800   darbepoetin (ARANESP) injection - DIALYSIS  200 mcg Subcutaneous Q  Tue-1800   dorzolamide-timolol  1 drop Right Eye BID   hydrocerin   Topical BID   levETIRAcetam  500 mg Oral Once per day on Sunday Monday Wednesday Friday   levETIRAcetam  750 mg Oral Once per day on Tuesday Thursday Saturday   lidocaine-prilocaine  1 Application Topical Once per day on Monday Wednesday Friday   multivitamin  1 tablet Oral QHS   nutrition supplement (JUVEN)  1 packet Oral BID BM   pneumococcal 20-valent conjugate vaccine  0.5 mL Intramuscular Tomorrow-1000   polyethylene glycol  17 g Oral BID   SMOG  960 mL Rectal Once   zinc sulfate (50mg  elemental zinc)  220 mg Oral Daily     Dialysis Orders: [facility] TTS 3h  72kg  B400   LUA AVF  Heparin 1000+ 400 u/hr   Assessment/Plan: Right BKA with painful gangrenous wound: S/p right AKA from BKA 2/12 by [name]. Antibiotics per admit team/[name] Hyperkalemia-K+ 6.4 this AM. 1.0 K bath for 1 hour then switch to 2.0 K bath. Follow labs.  ESRD -HD TTS. Last HD 04/09/2023. Next HD 04/14/2023 supposedly at OP clinic but will enter orders in case he does not leave hospital today.   General Weakness/debility: secondary #1 patient family unable to care for patient. Inpatient rehab consulted yesterday. SNF recommended Hyperkalemia: K+ 5.3 this AM. Lokelma 10 grams PO X 1 dose.  HTN/volume: no excess volume on exam . BP okay. On midodrine 5 mg 3 times daily.  Anemia: Hgb now 8.6. On Aranesp to weekly (recently raised). Transfuse for Hgb < 7 if needed given recent procedure. Secondary hyperparathyroidism: corrected calcium okay, phosphorus 5.2, on calcitriol, Sensipar, and calcium acetate as binder Atrial fibrillation: on amiodarone and Coreg, no anticoag DMT2- per primary  Disposition: DC to SNF, resume care at OP HD unit 04/14/2023  [name] H. Montine Hight NP-C 04/13/2023, 9:47 AM  [employer] [phone]

## 2023-04-13 NOTE — Progress Notes (Signed)
Occupational Therapy Treatment Patient Details Name: [name]. MRN: [id number] DOB: 05-05-1970 Today's Date: 04/13/2023   History of present illness The pt is a 53 yo male presenting 1/30 from HD with blurriness and weakness since BKA on 02/09/23. Admitted to [facility] for FTT, inability to manage at home, transferred to [facility] 2/6 for surgical debridement. 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 PMH includes:  ESRD on HD TTS, recent right BKA ([name]), CAD, CVA, paroxysmal atrial fibrillation not on anticoagulation due to history of ICH, gastroparesis, type 2 diabetes mellitus, diabetic retinopathy, glaucoma, and history of left eye enucleation.   OT comments  [name] no pain at rest, has pain to R residual LE with activity. Pt eager to improve and actively participated. Attempted to use Stedy to assist with STS, able to slightly lift weight off bed, but not able to fully lift off bed. Pt completed BUE/LLE exercises at bedside. Pt has poor sitting balance, posterior lean, required BUE holding onto rails to maintain balance, eventually needed pillows to lean back on. Pt max A for in/out of bed. Pt would benefit from continued acute OT to maximize strength and participation, DC to postacute rehab <3hrs/day appropriate.       If plan is discharge home, recommend the following:  Two people to help with walking and/or transfers;A lot of help with bathing/dressing/bathroom;Assistance with cooking/housework;Assist for transportation;Help with stairs or ramp for entrance   Equipment Recommendations  Other (comment) (defer)    Recommendations for Other Services      Precautions / Restrictions Precautions Precautions: Fall Recall of Precautions/Restrictions: Intact Precaution/Restrictions Comments: R AKA Restrictions Weight Bearing Restrictions Per Provider Order: No       Mobility Bed Mobility Overal bed mobility: Needs Assistance Bed Mobility: Supine to Sit, Sit to Supine     Supine to sit: Max assist, HOB  elevated, Used rails Sit to supine: Max assist   General bed mobility comments: max A in/out of bed, to assist with sitting up and scotting to EOB and back.    Transfers Overall transfer level: Needs assistance                 General transfer comment: attempted to use Stedy, not able to stand     Balance Overall balance assessment: Needs assistance Sitting-balance support: Feet supported, Bilateral upper extremity supported Sitting balance-Leahy Scale: Poor Sitting balance - Comments: poor L side coordination due to hx of stroke, posterior lean, poor trunk control Postural control: Posterior lean, Left lateral lean Standing balance support: Bilateral upper extremity supported, During functional activity, Reliant on assistive device for balance Standing balance-Leahy Scale: Zero                             ADL either performed or assessed with clinical judgement   ADL Overall ADL's : Needs assistance/impaired                                            Extremity/Trunk Assessment Upper Extremity Assessment Upper Extremity Assessment: RUE deficits/detail;LUE deficits/detail RUE Deficits / Details: R 4th digit not present, generalized weakness LUE Deficits / Details: 3rd digit tip of finger distal from DIP. generalized weakness, L shoudler stiff/weak.            Vision       Perception     Praxis  Communication Communication Communication: No apparent difficulties   Cognition Arousal: Alert Behavior During Therapy: Flat affect Cognition: No apparent impairments                               Following commands: Intact        Cueing   Cueing Techniques: Verbal cues, Tactile cues, Visual cues  Exercises Other Exercises Other Exercises: LLE exercises Other Exercises: BUE theraband exercises Other Exercises: attempted STS with Stedy    Shoulder Instructions       General Comments      Pertinent Vitals/  Pain       Pain Assessment Pain Assessment: Faces Faces Pain Scale: Hurts even more Pain Location: R residual LE with activity Pain Descriptors / Indicators: Discomfort, Sore, Sharp, Throbbing Pain Intervention(s): Monitored during session  Home Living                                          Prior Functioning/Environment              Frequency  Min 1X/week        Progress Toward Goals  OT Goals(current goals can now be found in the care plan section)  Progress towards OT goals: Progressing toward goals  Acute Rehab OT Goals Patient Stated Goal: to improve strength OT Goal Formulation: With patient Time For Goal Achievement: 04/21/23 Potential to Achieve Goals: Fair ADL Goals Pt Will Perform Grooming: with modified independence;sitting Pt Will Perform Upper Body Dressing: with set-up;sitting Pt Will Perform Lower Body Dressing: with set-up;sitting/lateral leans Pt Will Transfer to Toilet: with contact guard assist;bedside commode Pt Will Perform Toileting - Clothing Manipulation and hygiene: with set-up;with supervision;sitting/lateral leans Pt/caregiver will Perform Home Exercise Program: Increased ROM;Increased strength;Both right and left upper extremity;Independently  Plan      Co-evaluation                 AM-PAC OT "6 Clicks" Daily Activity     Outcome Measure   Help from another person eating meals?: A Little Help from another person taking care of personal grooming?: A Little Help from another person toileting, which includes using toliet, bedpan, or urinal?: A Lot Help from another person bathing (including washing, rinsing, drying)?: A Lot Help from another person to put on and taking off regular upper body clothing?: A Little Help from another person to put on and taking off regular lower body clothing?: A Lot 6 Click Score: 15    End of Session Equipment Utilized During Treatment: Gait belt  OT Visit Diagnosis:  Unsteadiness on feet (R26.81);Other abnormalities of gait and mobility (R26.89);Muscle weakness (generalized) (M62.81)   Activity Tolerance Patient tolerated treatment well   Patient Left in bed;with call bell/phone within reach;with bed alarm set   Nurse Communication Mobility status        Time: 1610-9604 OT Time Calculation (min): 35 min  Charges: OT General Charges $OT Visit: 1 Visit OT Treatments $Therapeutic Activity: 8-22 mins $Therapeutic Exercise: 8-22 mins  Elysha Daw, OTR/L   Alexis Goodell 04/13/2023, 1:59 PM

## 2023-04-14 DIAGNOSIS — H538 Other visual disturbances: Secondary | ICD-10-CM | POA: Diagnosis not present

## 2023-04-14 DIAGNOSIS — R531 Weakness: Secondary | ICD-10-CM | POA: Diagnosis not present

## 2023-04-14 DIAGNOSIS — Z87898 Personal history of other specified conditions: Secondary | ICD-10-CM

## 2023-04-14 DIAGNOSIS — R5381 Other malaise: Secondary | ICD-10-CM | POA: Diagnosis not present

## 2023-04-14 DIAGNOSIS — D638 Anemia in other chronic diseases classified elsewhere: Secondary | ICD-10-CM | POA: Diagnosis not present

## 2023-04-14 LAB — RENAL FUNCTION PANEL
Albumin: 2.3 g/dL — ABNORMAL LOW (ref 3.5–5.0)
Anion gap: 18 — ABNORMAL HIGH (ref 5–15)
BUN: 145 mg/dL — ABNORMAL HIGH (ref 6–20)
CO2: 22 mmol/L (ref 22–32)
Calcium: 9.2 mg/dL (ref 8.9–10.3)
Chloride: 96 mmol/L — ABNORMAL LOW (ref 98–111)
Creatinine, Ser: 11.03 mg/dL — ABNORMAL HIGH (ref 0.61–1.24)
GFR, Estimated: 5 mL/min — ABNORMAL LOW (ref >60)
Glucose, Bld: 79 mg/dL (ref 70–99)
Phosphorus: 8.2 mg/dL — ABNORMAL HIGH (ref 2.5–4.6)
Potassium: 5.6 mmol/L — ABNORMAL HIGH (ref 3.5–5.1)
Sodium: 136 mmol/L (ref 135–145)

## 2023-04-14 LAB — CBC
HCT: 25.4 % — ABNORMAL LOW (ref 39.0–52.0)
Hemoglobin: 7.8 g/dL — ABNORMAL LOW (ref 13.0–17.0)
MCH: 22.7 pg — ABNORMAL LOW (ref 26.0–34.0)
MCHC: 30.7 g/dL (ref 30.0–36.0)
MCV: 73.8 fL — ABNORMAL LOW (ref 80.0–100.0)
Platelets: 157 10*3/uL (ref 150–400)
RBC: 3.44 MIL/uL — ABNORMAL LOW (ref 4.22–5.81)
RDW: 20.4 % — ABNORMAL HIGH (ref 11.5–15.5)
WBC: 11.2 10*3/uL — ABNORMAL HIGH (ref 4.0–10.5)
nRBC: 0 % (ref 0.0–0.2)

## 2023-04-14 LAB — GLUCOSE, CAPILLARY: Glucose-Capillary: 74 mg/dL (ref 70–99)

## 2023-04-14 MED ORDER — LIDOCAINE-PRILOCAINE 2.5-2.5 % EX CREA: 1.0000 | TOPICAL_CREAM | CUTANEOUS | Status: DC | PRN

## 2023-04-14 MED ORDER — HEPARIN SODIUM (PORCINE) 1000 UNIT/ML IJ SOLN
3000.0000 [IU] | Freq: Once | INTRAMUSCULAR | Status: AC
  Administered 2023-04-14: 3000 [IU]

## 2023-04-14 MED ORDER — LIDOCAINE HCL (PF) 1 % IJ SOLN: 5.0000 mL | INTRAMUSCULAR | Status: DC | PRN

## 2023-04-14 NOTE — Progress Notes (Signed)
[facility] Progress Note   Subjective: Seen in room. No C/Os. Plan is to have HD today then DC back to SNF.     Objective Vitals:   04/14/23 1042 04/14/23 1149 04/14/23 1203 04/14/23 1230  BP: (!) 126/52 135/61 (!) 122/54 138/71  Pulse: (!) 55 (!) 55 (!) 53 (!) 105  Resp:  11 (!) 9 11  Temp: 98.2 F (36.8 C) 98.3 F (36.8 C)    TempSrc: Oral Oral    SpO2: 94%  96% 95%  Weight:  75.6 kg    Height:       Physical Exam General: Chronically ill appearing male in NAD Heart: S1,S2 RRR No M/R/G Lungs: CTAB No WOB Abdomen:NABS, NT Extremities: R AKA LLE no pedal edema Dialysis Access: LUA AVG + T/B    Additional Objective Labs: Basic Metabolic Panel: Recent Labs  Lab 04/12/23 0757 04/13/23 0734 04/14/23 1155  NA 138 135 136  K 6.4* 5.3* 5.6*  CL 99 96* 96*  CO2 20* 23 22  GLUCOSE 78 76 79  BUN 167* 111* 145*  CREATININE 12.34* 9.04* 11.03*  CALCIUM 9.4 9.0 9.2  PHOS 8.4* 7.0* 8.2*   Liver Function Tests: Recent Labs  Lab 04/12/23 0757 04/13/23 0734 04/14/23 1155  ALBUMIN 2.5* 2.4* 2.3*   No results for input(s): "LIPASE", "AMYLASE" in the last 168 hours. CBC: Recent Labs  Lab 04/08/23 0500 04/09/23 0955 04/12/23 0757 04/13/23 0734 04/14/23 1130  WBC 7.3 8.3 12.9* 11.8* 11.2*  HGB 7.2* 8.1* 8.6* 8.4* 7.8*  HCT 23.7* 26.7* 28.1* 27.3* 25.4*  MCV 76.0* 75.0* 74.9* 74.6* 73.8*  PLT 163 161 178 163 157   Blood Culture    Component Value Date/Time   SDES TISSUE 02/04/2023 1206   SDES WOUND 02/04/2023 1206   SPECREQUEST RIGHT FOOT 02/04/2023 1206   SPECREQUEST RIGHT FOOT 02/04/2023 1206   CULT  02/04/2023 1206    ABUNDANT STAPHYLOCOCCUS AUREUS MODERATE CORYNEBACTERIUM STRIATUM Standardized susceptibility testing for this organism is not available. NO ANAEROBES ISOLATED Performed at [facility], [address], [place], [place] [address]    CULT  02/04/2023 1206    ABUNDANT STAPHYLOCOCCUS AUREUS SUSCEPTIBILITIES PERFORMED  ON PREVIOUS CULTURE WITHIN THE LAST 5 DAYS. MODERATE CORYNEBACTERIUM STRIATUM Standardized susceptibility testing for this organism is not available. NO ANAEROBES ISOLATED Performed at [facility], [address] [place], [place] [address]    REPTSTATUS 02/09/2023 FINAL 02/04/2023 1206   REPTSTATUS 02/09/2023 FINAL 02/04/2023 1206    Cardiac Enzymes: No results for input(s): "CKTOTAL", "CKMB", "CKMBINDEX", "TROPONINI" in the last 168 hours. CBG: Recent Labs  Lab 04/12/23 0412 04/14/23 1128  GLUCAP 79 74   Iron Studies: No results for input(s): "IRON", "TIBC", "TRANSFERRIN", "FERRITIN" in the last 72 hours. @lablastinr3 @ Studies/Results: No results found. Medications:  sodium chloride      (feeding supplement) PROSource Plus  30 mL Oral TID BM   amiodarone  200 mg Oral Daily   vitamin C  1,000 mg Oral Daily   aspirin EC  81 mg Oral Daily   atorvastatin  80 mg Oral QHS   bisacodyl  10 mg Rectal Once   calcitRIOL  2.25 mcg Oral Q T,Th,Sat-1800   calcium acetate  1,334 mg Oral TID WC   carvedilol  12.5 mg Oral BID   cinacalcet  60 mg Oral Q T,Th,Sat-1800   darbepoetin (ARANESP) injection - DIALYSIS  200 mcg Subcutaneous Q Tue-1800   dorzolamide-timolol  1 drop Right Eye BID  heparin sodium (porcine)  3,000 Units Intracatheter Once   hydrocerin   Topical BID   levETIRAcetam  500 mg Oral Once per day on Sunday Monday Wednesday Friday   levETIRAcetam  750 mg Oral Once per day on Tuesday Thursday Saturday   lidocaine-prilocaine  1 Application Topical Once per day on Monday Wednesday Friday   multivitamin  1 tablet Oral QHS   nutrition supplement (JUVEN)  1 packet Oral BID BM   pneumococcal 20-valent conjugate vaccine  0.5 mL Intramuscular Tomorrow-1000   polyethylene glycol  17 g Oral BID   zinc sulfate (50mg  elemental zinc)  220 mg Oral Daily     Dialysis Orders: [facility] TTS 3h  72kg  B400  LUA AVF  Heparin 1000+ 400 u/hr    Assessment/Plan: Right BKA with painful gangrenous wound: S/p right AKA from BKA 2/12 by [name]. Antibiotics per admit team/[name] Hyperkalemia-K+ 6.4 this AM. 1.0 K bath for 1 hour then switch to 2.0 K bath. Follow labs.  ESRD -HD TTS. Last HD 04/09/2023. Next HD 04/14/2023  General Weakness/debility: secondary #1 patient family unable to care for patient. Inpatient rehab consulted yesterday. SNF recommended Hyperkalemia: K+ 5.3 this AM. Lokelma 10 grams PO X 1 dose.  HTN/volume: no excess volume on exam . BP okay. On midodrine 5 mg 3 times daily.  Anemia: Hgb now 8.6. On Aranesp to weekly (recently raised). Transfuse for Hgb < 7 if needed given recent procedure. Secondary hyperparathyroidism: corrected calcium okay, phosphorus 5.2, on calcitriol, Sensipar, and calcium acetate as binder Atrial fibrillation: on amiodarone and Coreg, no anticoag DMT2- per primary   Disposition: DC to SNF, resume care at OP HD unit 04/16/2023  Jerusalen Mateja H. Caelan Branden NP-C 04/14/2023, 1:02 PM  [employer] [phone]

## 2023-04-14 NOTE — Progress Notes (Signed)
Received patient in bed.Awake,alert and orientd x 4. Consent verified.  Access used : Left AVF that worked well.  Duration of treatment: 3 .25 hours.  Uf goal:  2.2 liters.  Tolerated treatment: Yes .  Medicine given: Heparin 3,000 units pre-dose.  Hand off to the patient's nurse:Back into his room with stable medical condition via transporter.

## 2023-04-14 NOTE — Progress Notes (Signed)
D/C order noted. Contacted [facility] to advise staff of pt's d/c today and that pt should resume care on Saturday. Clinic aware pt is going to snf and snf name provided. D/C summary and today's renal note faxed to clinic for continuation of care. [name] HD info placed on AVS as well.   Olivia Canter Renal Navigator [phone]

## 2023-04-14 NOTE — TOC Transition Note (Signed)
Transition of Care [facility]) - Discharge Note   Patient Details  Name: [name]. MRN: [id number] Date of Birth: Dec 13, 1970  [facility] of [facility]) CM/SW Contact:  Deatra Robinson, [place] Phone Number: 04/14/2023, 12:55 PM   Clinical Narrative: Pt for dc to [facility] today after HD. Spoke to [place] [facility] who confirmed they are prepared to admit pt to room 503-1. [name] Martie Lee aware of dc and reports agreeable. RN provided with number for report and PTAR arranged for transport. SW signing off at dc.   Dellie Burns, MSW, LCSW [phone] (coverage)        Final next level of care: Skilled Nursing Facility Barriers to Discharge: Barriers Resolved   Patient Goals and CMS Choice Patient states their goals for this hospitalization and ongoing recovery are:: Dc to [facility] rehab if they will accept pt CMS Medicare.gov Compare Post Acute Care list provided to:: Patient Choice offered to / list presented to : Patient, Spouse [place] ownership interest in [facility].provided to:: Patient    Discharge Placement              Patient chooses bed at: Other - please specify in the comment section below: [facility]) Patient to be transferred to facility by: PTAR Name of family member notified: [name]/wife Patient and family notified of of transfer: 04/14/23  Discharge Plan and Services Additional resources added to the After Visit Summary for                                       Social Drivers of Health (SDOH) Interventions SDOH Screenings   Food Insecurity: Patient Declined (03/31/2023)  Housing: Patient Declined (03/31/2023)  Transportation Needs: Patient Declined (03/31/2023)  Utilities: Patient Declined (03/31/2023)  Financial Resource Strain: High Risk (07/05/2017)  Physical Activity: Inactive (07/05/2017)  Social Connections: Unknown (03/31/2023)  Stress: Stress Concern Present (07/05/2017)  Tobacco Use: Low Risk  (04/06/2023)  Health  Literacy: Low Risk  (06/01/2020)   [facility] from [facility]     Readmission Risk Interventions    07/27/2021   11:49 AM 11/27/2020    3:06 PM  Readmission Risk Prevention Plan  Transportation Screening Complete Complete  PCP or Specialist Appt within 3-5 Days  Complete  HRI or Home Care Consult  Complete  Social Work Consult for Recovery Care Planning/Counseling  Complete  Palliative Care Screening  Not Applicable  Medication Review Oceanographer) Complete Complete  SW Recovery Care/Counseling Consult Complete   Palliative Care Screening Not Applicable   Skilled Nursing Facility Not Applicable

## 2023-04-14 NOTE — Discharge Summary (Addendum)
Physician Discharge Summary   Patient: [name]. MRN: [id number] DOB: 1970-11-26  Admit date:     03/24/2023  Discharge date: 04/14/23  Discharge Physician: Thad Ranger, MD     PCP: [name], FNP   Recommendations at discharge:    Outpatient follow-up with 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 male with ESRD on HD TTS, recent right BKA ([name]), CAD, CVA, paroxysmal atrial fibrillation not on anticoagulation due to history of ICH, gastroparesis, DM type II, diabetic retinopathy, glaucoma, history of left eye enucleation who presented to [name], ED on 1/30 via EMS from dialysis unit with complaints of increased blurriness, weakness, cough and difficulty managing at home.  Patient states symptoms worse with dialysis.  Recently  discharged from the hospital to SNF for 3 weeks and has been home over the last 10 days.  Patient reports is unable to take care of himself due to weakness and spouse working all the time.  Requesting placement.  MRA showed no intracranial large vessel occlusion or significant stenosis.  [name] discussed with ophthalmology on-call, [name] regarding his blurred vision in the setting of known diabetic retinopathy, glaucoma and history of left eye enucleation; ophthalmology recommended no other workup at this time and vision to be monitored and outpatient follow-up with his retina specialist.   [name] consulted for admission for further evaluation management of progressive weakness with TOC involvement for placement.  PT recommended SNF however insurance denied SNF authorization he is currently appealing the denial. He was transferred to [facility] for evaluation of the right BKA gangrene   Assessment and Plan:  Generalized weakness/deconditioning and gait disturbances -Patient had recent prolonged hospitalization followed by subacute rehab after right BKA by [name] on 02/09/2023.   -PT evaluation recommended SNF, has new right AKA since 04/06/2023      Gangrene of the right foot status post BKA in the setting of peripheral vascular disease:  -Patient with recent BKA February 09, 2023 by [name].  Recently seen in outpatient office visit 03/22/2023 and was noted to have gangrenous changes to the RLE previous amputation site d -Dressing changes and wound care were recommended -Dr [name] was consulted, patient underwent right above-knee amputation with application of Prevena wound VAC on 04/06/2023  -Pain controlled,  Outpatient follow-up with [name] in 1 week   Paroxysmal atrial fibrillation -Continue rate control with amiodarone, Coreg -Not on anticoagulation due to history of ICH     Diabetes mellitus type 2, with end-stage renal disease, peripheral vascular disease -CBGs currently controlled -Not on  insulin   Essential hypertension -Continue Coreg   Seizure disorder -Continue with Keppra 500 mg on Sunday Tuesday Thursday and Saturday and 750 mg daily on Monday Wednesday and Friday -No new seizures since admission.     Anemia of chronic disease -Baseline hemoglobin between 7-8 -Hemoglobin 8.4 at discharge     ESRD on dialysis on TTS, hyperkalemia -Nephrology consulted, undergoing HD  -Receives outpatient dialysis at [facility]  -Patient will discharge to SNF today after the dialysis.  Next dialysis will be 04/16/2023.     Autonomic dysfunction Continue midodrine    Pressure injury documentation Right thigh unstageable, POA Left heel, lateral, stage II, POA Left heel medial, deep tissue injury, POA -Wound care per nursing    Constipation -Continue MiraLAX, Colace,   Estimated body mass index is 21.82 kg/m as calculated from the following:   Height as of this encounter: 6\' 3" (1.905 m).   Weight as of this encounter: 79.2 kg.      Pain control - [place] Controlled Substance Reporting System database was reviewed. and patient was instructed, not to drive, operate heavy machinery, perform activities at heights, swimming or participation in water activities or provide baby-sitting services while on Pain, Sleep and Anxiety Medications; until their outpatient Physician has advised to do so again. Also recommended to not to take more than prescribed Pain, Sleep and Anxiety Medications.  Consultants: Nephrology, orthopedics, [name] Procedures performed: Right AKA Disposition: Skilled nursing facility Diet recommendation:  Discharge Diet Orders (From admission, onward)     Start     Ordered   04/12/23 0000  Diet - low sodium heart healthy        02 /18/25 1330            DISCHARGE MEDICATION: Allergies as of 04/14/2023       Reactions   Promethazine Diarrhea, Other (See Comments)   Muscle cramps, cramping "all over"        Medication  List     TAKE these medications    (feeding supplement) PROSource Plus liquid Take 30 mLs by mouth 3 (three) times daily between meals.   nutrition supplement (JUVEN) Pack Take 1 packet by mouth 2 (two) times daily between meals.   acetaminophen 325 MG tablet Commonly known as: TYLENOL Take 1-2 tablets (325-650 mg total) by mouth every 4 (four) hours as needed for mild pain.   amiodarone 200 MG tablet Commonly known as: PACERONE Take 1 tablet (200 mg total) by mouth every morning.   ascorbic acid 1000 MG tablet Commonly known as: VITAMIN C Take 1 tablet (1,000 mg total) by mouth daily for 14 days.   aspirin EC 81 MG tablet Take 1 tablet (81 mg total) by mouth daily. Swallow whole.   atorvastatin 80 MG tablet Commonly known as: LIPITOR TAKE ONE TABLET BY MOUTH AT BEDTIME.   calcitRIOL 0.25 MCG capsule Commonly known as: ROCALTROL Take 9 capsules (2.25 mcg total) by mouth every Tuesday, Thursday, and Saturday at 6 PM.   calcium acetate 667 MG capsule Commonly known as: PHOSLO Take 2 capsules (1,334 mg total) by mouth 3 (three) times daily with meals.   carvedilol 12.5 MG tablet Commonly known as: COREG Take 1 tablet (  12.5 mg total) by mouth 2 (two) times daily.   cinacalcet 30 MG tablet Commonly known as: SENSIPAR Take 2 tablets (60 mg total) by mouth every Tuesday, Thursday, and Saturday at 6 PM.   docusate sodium 100 MG capsule Commonly known as: Colace Take 1 capsule (100 mg total) by mouth 2 (two) times daily.   dorzolamide-timolol 2-0.5 % ophthalmic solution Commonly known as: COSOPT Place 1 drop into the right eye 2 (two) times daily.   hydrocortisone 2.5 % rectal cream Commonly known as: ANUSOL-HC Place 1 Application rectally 2 (two) times daily.   levETIRAcetam 750 MG tablet Commonly known as: KEPPRA Take 1 tablet (750 mg total) by mouth 3 (three) times a week. On Tuesday Thursday and Saturday, on dialysis days   levETIRAcetam 500 MG  tablet Commonly known as: KEPPRA Take 1 tablet (500 mg total) by mouth 4 (four) times a week. On Sunday, Monday, Wednesday and Friday, all non-dialysis days   lidocaine-prilocaine cream Commonly known as: EMLA Apply 1 Application topically 3 (three) times a week.   loperamide 2 MG capsule Commonly known as: IMODIUM Take 1 capsule (2 mg total) by mouth as needed for diarrhea or loose stools.   midodrine 5 MG tablet Commonly known as: PROAMATINE Take 1 tablet (5 mg total) by mouth 3 (three) times daily with meals.   multivitamin Tabs tablet Take 1 tablet by mouth at bedtime.   ondansetron 4 MG tablet Commonly known as: ZOFRAN Take 1 tablet (4 mg total) by mouth every 6 (six) hours as needed for nausea or vomiting.   polyethylene glycol 17 g packet Commonly known as: MIRALAX / GLYCOLAX Take 17 g by mouth daily.   zinc sulfate (50mg  elemental zinc) 220 (50 Zn) MG capsule Take 1 capsule (220 mg total) by mouth daily for 14 days.               Discharge Care Instructions  (From admission, onward)           Start     Ordered   04/12/23 0000  Discharge wound care:       Comments: Dressing procedure/placement/frequency: Cleanse stump wound and left heel wounds with Vahse.  Apply VASHE moist gauze to wound bed, cover with dry gauze and kerlix/tape.  Change daily.   Apply Medihoney to the right  stump wound, top with saline moist gauze, and kerlix with ACE wrap.   04/12/23 1330            Contact information for follow-up providers     [name], MD Follow up in 1 week(s).   Specialty: Orthopedic Surgery Why: for hospital follow-up Contact information: [address] [place] [address] [phone]         [name], FNP. Schedule an appointment as soon as possible for a visit in 2 week(s).   Specialty: Nurse Practitioner Why: for hospital follow-up Contact information: [address] [place] [address] [phone]          Dialysis, [facility]. Go on 04/16/2023.   Why: Schedule is Tuesday, Thursday, Saturday with 5:15 am chair time. Contact information: [address] [name] [address] [phone]              Contact information for after-discharge care     Destination     [facility] .   Service: Skilled Nursing Contact information: [address] [place] [address] [phone]  Discharge Exam: Filed Weights   04/14/23 1149 04/14/23 1528 04/14/23 1532  Weight: 75.6 kg 73.3 kg 71 kg   S: No acute complaints, will discharge after hemodialysis today.  No chest pain, shortness of breath, fevers or chills.  Tolerating diet without difficulty.  BP (!) 105/55   Pulse (!) 58   Temp 98.1 F (36.7 C) (Oral)   Resp 10   Ht 6\' 3"  (1.905 m)   Wt 71 kg   SpO2 100%   BMI 19.56 kg/m   Physical Exam General: Alert and oriented x 3, NAD Cardiovascular: S1 S2 clear, RRR.  Respiratory: CTAB, no wheezing Gastrointestinal: Soft, nontender, nondistended, NBS Ext: right AKA, staples intact Neuro: no new deficits Psych: Normal affect    Condition at discharge: fair  The results of significant diagnostics from this hospitalization (including imaging, microbiology, ancillary and laboratory) are listed below for reference.   Imaging Studies: MR FOOT LEFT WO CONTRAST Result Date: 04/05/2023 CLINICAL DATA:  Osteomyelitis, foot dry gangrene EXAM: MRI OF THE LEFT FOOT WITHOUT CONTRAST TECHNIQUE: Multiplanar, multisequence MR imaging of the left forefoot was performed. No intravenous contrast was administered. COMPARISON:  None recent. Left foot radiographs 05/25/2022 and 06/11/2016. MRI of the left ankle 08/04/2016. FINDINGS: Bones/Joint/Cartilage Status post remote amputation of the great toe at the metatarsophalangeal joint. No significant abnormality of the 1st metatarsal head. As seen on the more recent radiographs, there  is fragmentation and collapse of the 2nd metatarsal head consistent with Freiberg infraction. There are mild secondary degenerative changes. There is abnormal T2 hyperintensity within the 2nd proximal phalanx with the suggestion of a subacute fracture based on the short axis transverse axial images. No other significant marrow signal abnormalities identified in the forefoot. There is mild subchondral edema proximally in the navicular, likely reactive. Ligaments Intact Lisfranc ligament. The collateral ligaments of the remaining metatarsophalangeal joints appear intact. Muscles and Tendons Mild nonspecific generalized muscular atrophy and edema. There is a small amount of fluid surrounding the flexor digitorum longus tendons proximally in the forefoot. No significant distal tenosynovitis. Soft tissues Chronic linear metallic foreign body within the soft tissues lateral to the 5th metatarsal head with associated susceptibility artifact, unchanged from the most recent radiographs. This limits evaluation of the surrounding soft tissues. No obvious skin ulceration or focal fluid collection identified. IMPRESSION: 1. 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 2. Abnormal T2 hyperintensity within the 2nd proximal phalanx with the suggestion of a subacute fracture. Recommend plain film correlation. 3. No findings highly suspicious for osteomyelitis in the forefoot. Recommend clinical and radiographic correlation. 4. Chronic linear metallic foreign body within the soft tissues lateral to the 5th metatarsal head with associated susceptibility artifact, unchanged from the most recent radiographs. This limits evaluation of the surrounding soft tissues. No obvious skin ulceration or focal fluid collection identified. 5. Mild nonspecific generalized muscular atrophy and edema. 6. Small amount of fluid surrounding the flexor digitorum longus tendons  proximally in the forefoot, nonspecific. Electronically Signed   By: Carey Bullocks M.D.   On: 04/05/2023 08:39   MR ANGIO HEAD WO CONTRAST Result Date: 03/24/2023 CLINICAL DATA:  Vision loss, monocular EXAM: MRA HEAD WITHOUT CONTRAST TECHNIQUE: Angiographic images of the Circle of Willis were acquired using MRA technique without intravenous contrast. COMPARISON:  03/17/2023 MRA FINDINGS: Anterior circulation: Both internal carotid arteries are patent to the termini, without significant stenosis. A1 segments patent. Normal anterior communicating artery. Possible focal stenosis in the  left A3 (series 3, image 120) is favored to be artifactual given motion artifact in several vessels at that location; on the prior exam, there was no motion in this location and no stenosis. Anterior cerebral arteries are patent to their distal aspects without significant stenosis. No M1 stenosis or occlusion. Distal MCA branches perfused to their distal aspects without significant stenosis. Posterior circulation: Vertebral arteries patent to the vertebrobasilar junction without stenosis. Posterior inferior cerebral arteries patent bilaterally. Basilar patent to its distal aspect. Superior cerebellar arteries patent proximally. Patent P1 segments. PCAs perfused to their distal aspects without significant stenosis. The right posterior communicating artery is patent. Anatomic variants: None significant IMPRESSION: No intracranial large vessel occlusion or significant stenosis. Electronically Signed   By: Wiliam Ke M.D.   On: 03/24/2023 13:30   MR BRAIN WO CONTRAST Result Date: 03/17/2023 CLINICAL DATA:  dizziness and blurred vision EXAM: MRI HEAD WITHOUT CONTRAST TECHNIQUE: Multiplanar, multiecho pulse sequences of the brain and surrounding structures were obtained without intravenous contrast. COMPARISON:  MRI head 03/01/2022. FINDINGS: Brain: Remote cortical infarcts in the high frontal lobes bilaterally and the right  parietal lobe. Additional scattered T2/FLAIR hyperintensities in the white matter are nonspecific but compatible with chronic microvascular ischemic change. No convincing evidence of acute infarct. No acute hemorrhage, mass lesion, midline shift or hydrocephalus. Redemonstrated sequela prior hemorrhage include areas of superficial siderosis Vascular: Major arterial flow voids are maintained at the skull base. Skull and upper cervical spine: Normal marrow signal. Sinuses/Orbits: Mild paranasal sinus mucosal thickening. No acute orbital findings. Other: Trace mastoid effusions. IMPRESSION: 1. No evidence of acute intracranial abnormality. 2. Prior cortical infarcts and chronic microvascular ischemic changes. 3. 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 Electronically Signed   By: Feliberto Harts M.D.   On: 03/17/2023 17:28   MR ANGIO HEAD WO CONTRAST Result Date: 03/17/2023 CLINICAL DATA:  dizziness and blurred vision EXAM: MRI HEAD WITHOUT CONTRAST TECHNIQUE: Multiplanar, multiecho pulse sequences of the brain and surrounding structures were obtained without intravenous contrast. COMPARISON:  MRI head 03/01/2022. FINDINGS: Brain: Remote cortical infarcts in the high frontal lobes bilaterally and the right parietal lobe. Additional scattered T2/FLAIR hyperintensities in the white matter are nonspecific but compatible with chronic microvascular ischemic change. No convincing evidence of acute infarct. No acute hemorrhage, mass lesion, midline shift or hydrocephalus. Redemonstrated sequela prior hemorrhage include areas of superficial siderosis Vascular: Major arterial flow voids are maintained at the skull base. Skull and upper cervical spine: Normal marrow signal. Sinuses/Orbits: Mild paranasal sinus mucosal thickening. No acute orbital findings. Other: Trace mastoid effusions. IMPRESSION: 1. No evidence of acute intracranial abnormality. 2. Prior cortical infarcts and chronic microvascular ischemic changes.  3. 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 Electronically Signed   By: Feliberto Harts M.D.   On: 03/17/2023 17:28   CT ANGIO HEAD NECK W WO CM Result Date: 03/17/2023 CLINICAL DATA:  Vertigo, dizziness, headache, blurred vision EXAM: CT ANGIOGRAPHY HEAD AND NECK WITH AND WITHOUT CONTRAST TECHNIQUE: Multidetector CT imaging of the head and neck was performed using the standard protocol during bolus administration of intravenous contrast. Multiplanar CT image reconstructions and MIPs were obtained to evaluate the vascular anatomy. Carotid stenosis measurements (when applicable) are obtained utilizing NASCET criteria, using the distal internal carotid diameter as the denominator. RADIATION DOSE REDUCTION: This exam was performed according to the departmental dose-optimization program which includes automated exposure control, adjustment of the mA and/or kV according to patient size and/or use of iterative reconstruction technique. CONTRAST:  75mL OMNIPAQUE IOHEXOL 350  MG/ML SOLN COMPARISON:  03/01/2022 CTA head and neck, 02/07/2023 CT head FINDINGS: CT HEAD FINDINGS Brain: No evidence of acute infarct, hemorrhage, mass, mass effect, or midline shift. No hydrocephalus or extra-axial fluid collection. Vascular: No hyperdense vessel. Skull: Negative for fracture or focal lesion. Sinuses/Orbits: No acute finding.  Left globe prosthesis. Other: The mastoid air cells are well aerated. CTA HEAD AND NECK FINDINGS Evaluation is significantly limited by bolus timing, with the majority of the contrast refluxed into venous structures or not yet having reached the SVC. This study is essentially nondiagnostic with regards to the vasculature. Skeleton: No acute osseous abnormality. Degenerative changes in the cervical spine. Other neck: No acute finding. Upper chest: No focal pulmonary opacity or pleural effusion. Review of the MIP images confirms the above findings IMPRESSION: 1. No acute intracranial process. 2.  Evaluation of the vasculature is significantly limited by bolus timing, with the majority of the contrast refluxed into venous structures or not yet having reached the SVC. This study is essentially nondiagnostic with regards to the vasculature. If there is persistent clinical concern for an acute intracranial process, a repeat CTA head and neck is recommended. Electronically Signed   By: Wiliam Ke M.D.   On: 03/17/2023 16:02    Microbiology: Results for orders placed or performed during the hospital encounter of 03/24/23  Resp panel by RT-PCR (RSV, Flu A&B, Covid) Anterior Nasal Swab     Status: None   Collection Time: 03/24/23 10:04 AM   Specimen: Anterior Nasal Swab  Result Value Ref Range Status   SARS Coronavirus 2 by RT PCR NEGATIVE NEGATIVE Final    Comment: (NOTE) SARS-CoV-2 target nucleic acids are NOT DETECTED.  The SARS-CoV-2 RNA is generally detectable in upper respiratory specimens during the acute phase of infection. The lowest concentration of SARS-CoV-2 viral copies this assay can detect is 138 copies/mL. A negative result does not preclude SARS-Cov-2 infection and should not be used as the sole basis for treatment or other patient management decisions. A negative result may occur with  improper specimen collection/handling, submission of specimen other than nasopharyngeal swab, presence of viral mutation(s) within the areas targeted by this assay, and inadequate number of viral copies(<138 copies/mL). A negative result must be combined with clinical observations, patient history, and epidemiological information. The expected result is Negative.  Fact Sheet for Patients:  BloggerCourse.com  Fact Sheet for Healthcare Providers:  SeriousBroker.it  This test is no t yet approved or cleared by the [place] FDA and  has been authorized for detection and/or diagnosis of SARS-CoV-2 by FDA under an Emergency Use  Authorization (EUA). This EUA will remain  in effect (meaning this test can be used) for the duration of the COVID-19 declaration under Section 564(b)(1) of the Act, 21 U.S.C.section 360bbb-3(b)(1), unless the authorization is terminated  or revoked sooner.       Influenza A by PCR NEGATIVE NEGATIVE Final   Influenza B by PCR NEGATIVE NEGATIVE Final    Comment: (NOTE) The Xpert Xpress SARS-CoV-2/FLU/RSV plus assay is intended as an aid in the diagnosis of influenza from Nasopharyngeal swab specimens and should not be used as a sole basis for treatment. Nasal washings and aspirates are unacceptable for Xpert Xpress SARS-CoV-2/FLU/RSV testing.  Fact Sheet for Patients: BloggerCourse.com  Fact Sheet for Healthcare Providers: SeriousBroker.it  This test is not yet approved or cleared by the [place] FDA and has been authorized for detection and/or diagnosis of SARS-CoV-2 by FDA under an Emergency Use Authorization (EUA).  This EUA will remain in effect (meaning this test can be used) for the duration of the COVID-19 declaration under Section 564(b)(1) of the Act, 21 U.S.C. section 360bbb-3(b)(1), unless the authorization is terminated or revoked.     Resp Syncytial Virus by PCR NEGATIVE NEGATIVE Final    Comment: (NOTE) Fact Sheet for Patients: BloggerCourse.com  Fact Sheet for Healthcare Providers: SeriousBroker.it  This test is not yet approved or cleared by the [place] FDA and has been authorized for detection and/or diagnosis of SARS-CoV-2 by FDA under an Emergency Use Authorization (EUA). This EUA will remain in effect (meaning this test can be used) for the duration of the COVID-19 declaration under Section 564(b)(1) of the Act, 21 U.S.C. section 360bbb-3(b)(1), unless the authorization is terminated or revoked.  Performed at [facility], [address],  [place], [place] [address]   Gastrointestinal Panel by PCR , Stool     Status: None   Collection Time: 03/25/23  3:49 AM   Specimen: Stool  Result Value Ref Range Status   Campylobacter species NOT DETECTED NOT DETECTED Final   Plesimonas shigelloides NOT DETECTED NOT DETECTED Final   Salmonella species NOT DETECTED NOT DETECTED Final   Yersinia enterocolitica NOT DETECTED NOT DETECTED Final   Vibrio species NOT DETECTED NOT DETECTED Final   Vibrio cholerae NOT DETECTED NOT DETECTED Final   Enteroaggregative E coli (EAEC) NOT DETECTED NOT DETECTED Final   Enteropathogenic E coli (EPEC) NOT DETECTED NOT DETECTED Final   Enterotoxigenic E coli (ETEC) NOT DETECTED NOT DETECTED Final   Shiga like toxin producing E coli (STEC) NOT DETECTED NOT DETECTED Final   Shigella/Enteroinvasive E coli (EIEC) NOT DETECTED NOT DETECTED Final   Cryptosporidium NOT DETECTED NOT DETECTED Final   Cyclospora cayetanensis NOT DETECTED NOT DETECTED Final   Entamoeba histolytica NOT DETECTED NOT DETECTED Final   Giardia lamblia NOT DETECTED NOT DETECTED Final   Adenovirus F40/41 NOT DETECTED NOT DETECTED Final   Astrovirus NOT DETECTED NOT DETECTED Final   Norovirus GI/GII NOT DETECTED NOT DETECTED Final   Rotavirus A NOT DETECTED NOT DETECTED Final   Sapovirus (I, II, IV, and V) NOT DETECTED NOT DETECTED Final    Comment: Performed at [facility], [address] [place], [place] [address]  C Difficile Quick Screen w PCR reflex     Status: None   Collection Time: 03/25/23  3:50 AM   Specimen: Stool  Result Value Ref Range Status   C Diff antigen NEGATIVE NEGATIVE Final   C Diff toxin NEGATIVE NEGATIVE Final   C Diff interpretation No C. difficile detected.  Final    Comment: Performed at [facility], [address], [place], [place] [address]  Surgical PCR screen     Status: None   Collection Time: 04/01/23  6:05 PM   Specimen: Nasal Mucosa; Nasal Swab  Result Value Ref Range Status   MRSA, PCR  NEGATIVE NEGATIVE Final   Staphylococcus aureus NEGATIVE NEGATIVE Final    Comment: (NOTE) The Xpert SA Assay (FDA approved for NASAL specimens in patients 22 years of age and older), is one component of a comprehensive surveillance program. It is not intended to diagnose infection nor to guide or monitor treatment. Performed at [facility], [address], [place], [place] [address]     Labs: CBC: Recent Labs  Lab 04/08/23 0500 04/09/23 0955 04/12/23 0757 04/13/23 0734 04/14/23 1130  WBC 7.3 8.3 12.9* 11.8* 11.2*  HGB 7.2* 8.1* 8.6* 8.4* 7.8*  HCT 23.7* 26.7* 28.1* 27.3* 25.4*  MCV 76.0* 75.0* 74.9* 74.6* 73.8*  PLT 163 161 178 163 157   Basic Metabolic Panel: Recent Labs  Lab 04/08/23 0500 04/09/23 0955 04/12/23 0757 04/13/23 0734 04/14/23 1155  NA 137 137 138 135 136  K 5.1 5.0 6.4* 5.3* 5.6*  CL 99 99 99 96* 96*  CO2 23 25 20* 23 22  GLUCOSE 114* 88 78 76 79  BUN 151* 101* 167* 111* 145*  CREATININE 11.69* 8.82* 12.34* 9.04* 11.03*  CALCIUM 8.7* 9.1 9.4 9.0 9.2  PHOS  --   --  8.4* 7.0* 8.2*   Liver Function Tests: Recent Labs  Lab 04/12/23 0757 04/13/23 0734 04/14/23 1155  ALBUMIN 2.5* 2.4* 2.3*   CBG: Recent Labs  Lab 04/12/23 0412 04/14/23 1128  GLUCAP 79 74    Discharge time spent: greater than 30 minutes.  Signed: Thad Ranger, MD [facility] 04/14/2023

## 2023-04-14 NOTE — Plan of Care (Signed)
Report called to receiving nurse at [facility]

## 2023-04-22 ENCOUNTER — Ambulatory Visit (INDEPENDENT_AMBULATORY_CARE_PROVIDER_SITE_OTHER): Payer: Medicare HMO | Admitting: Family

## 2023-04-22 ENCOUNTER — Telehealth: Payer: Self-pay

## 2023-04-22 DIAGNOSIS — S78111A Complete traumatic amputation at level between right hip and knee, initial encounter: Secondary | ICD-10-CM

## 2023-04-22 DIAGNOSIS — Z89611 Acquired absence of right leg above knee: Secondary | ICD-10-CM

## 2023-04-22 NOTE — Telephone Encounter (Signed)
 Victorino Dike with [facility] in [place] would like a call back concerning a shrinker for right AKA for patient.  Stated that they don' have them at the facility.  CB# [phone].  Please advise.  Thank you.

## 2023-04-26 NOTE — Progress Notes (Signed)
 Post-Op Visit Note   Patient: [name].           Date of Birth: 06-Nov-1970           MRN: [id number] Visit Date: 04/22/2023 PCP: [name], FNP  Chief Complaint:  Chief Complaint  Patient presents with   Right Leg - Routine Post Op    04/06/23 right AKA    HPI:  HPI The patient is a 53 year old gentleman who is seen status post right above-knee amputation February 12 Ortho Exam On examination right above-knee amputation this is well consolidated well-healed staples are in place there is no active drainage  Visit Diagnoses: No diagnosis found.  Plan: Staples harvested today without incident.  Provided an order for his prosthesis set up he will follow-up with [place] in the office in 4 weeks.  Continue daily hygiene shrinker around-the-clock when obtained.  Follow-Up Instructions: No follow-ups on file.   Imaging: No results found.  Orders:  No orders of the defined types were placed in this encounter.  No orders of the defined types were placed in this encounter.    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 02/26/2022   H/O enucleation of left eyeball 02/26/2022   PAD (peripheral artery disease) (HCC) 02/26/2022   History of seizure 02/26/2022   Seasonal allergic rhinitis 07/08/2021   Myoclonus 06/23/2021   Exposure of orbital implant 03/16/2021   Steal syndrome as complication of dialysis access (HCC)  02/06/2021   Coronary artery disease 02/05/2021   Dehiscence of amputation stump of right lower extremity (HCC) 01/29/2021   ESRD on hemodialysis (HCC)    Anemia due to chronic kidney disease, on chronic dialysis (HCC)    Paroxysmal atrial fibrillation (HCC) - not on systemic anticoagulation due to ICH while on Eliquis in 11-2021. 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) 08/19/2020   Anxiety state 08/19/2020   Callosity 08/19/2020   ED (erectile dysfunction) of organic origin 08/19/2020   Long term (current) use of insulin (HCC) 08/19/2020   Myalgia 08/19/2020   CAD/ H/o prior STEMI 02/2020 s/p DES to LAD 08/19/2020   Blindness of left eye 08/15/2020   Combined forms of age-related cataract of right eye 04/17/2020   Hypertension secondary to other renal disorders 07/20/2019   Moderate protein-calorie malnutrition (HCC) 05/14/2019   Chronic anemia 05/07/2019   Coagulation defect, unspecified (HCC) 05/07/2019   Disorder of phosphorus metabolism, unspecified 05/07/2019   Iron deficiency anemia, unspecified 05/07/2019   Secondary hyperparathyroidism of renal origin (HCC) 11/28/2018   Chronic Grade 2 diastolic heart failure/EF 55 to 60% 12/13/2017   Diabetes mellitus, type II (HCC) 11/23/2017   Diabetic retinopathy (HCC) 07/05/2017   Uncontrolled type 2 diabetes mellitus with hyperglycemia, with long-term current use of insulin (HCC) 04/20/2016   Mixed hyperlipidemia    Essential hypertension, benign    Past Medical History:  Diagnosis Date   Acute Bil  CVAs of Bil Frontal amd  Rt Parietal  in Watershed Distribution 04/21/2021   Patchy acute cortically-based infarcts within the bilateral high  frontal lobes and right parietal lobe (predominantly in a watershed  distribution).   Subcentimeter acute infarct within the callosal splenium on the  left.     Acute bilateral cerebral infarction in a watershed distribution [name]) 04/21/2021   a.) MRI brain 04/21/2021 --> patchy ACUTE  cortically-based infarcts within the bilateral high frontal lobes and right parietal lobe   Acute cerebral infarction (HCC) 02/13/2021   a.) MRI brain 02/13/2021: acute LEFT hippocampal infarct   Acute cerebral infarction (HCC) 04/21/2021   a.) MRI brain 04/21/2021: subcentimeter ACUTE infarct within the callosal splenium on the LEFT   Acute osteomyelitis of toe of right foot with gangrene (HCC) 02/26/2022   Anaphylactic reaction due to adverse effect of correct drug or medicament properly administered, initial encounter 05/08/2019   Anemia of chronic renal failure    Anxiety    Aortic dilatation (HCC) 11/25/2020   a.) TTE 11/25/2020: Ao root measured 38 mm. b.) TTE 04/22/2021: Ao root measured 44 mm; c. 11/2021 Echo: Ao root 40mm.   Atrial fibrillation (HCC)    a.) CHA2DS2-VASc = 6 (CHF, HTN, CVA x2, prior MI, T2DM). b.) rate/rhythm maintained on oral amiodarone + carvedilol; chronic OAC/AP therapy discontinued following ICH   Benign prostatic hyperplasia with lower urinary tract symptoms 11/07/2019   BPH (benign prostatic hyperplasia)    Cardiac arrest (HCC) 07/26/2021   a.) in setting of hyperkalemia, NSTEMI, and seizure following missed HD; pulseless and apneic --> required 1 round of CPR prior to ROSC   Cellulitis    Cerebral hemorrhage (HCC) 11/21/2021   a.) CT head 11/21/2021 --> 1.4 x 1.5 x 3.1 cm ACUTE hemorrhage within the inferior aspect of the fourth ventricle, and extending inferiorly through the foramen of foramen of [place] --> occurred in setting of HTN emergency and DOAC/APT-->eliquis/plavix d/c'd.   Cerebral microvascular disease    Chronic diarrhea    Chronic heart failure with preserved ejection fraction (HFpEF) (HCC) 07/19/2017   a.) 06/2017 Echo: EF 75%; b.) 11/2020 Echo: EF 55-60%; c.) 04/2021 Echo: EF 55-60%; d.) 11/2021 Echo: EF 65-70%, GrII DD, nl RV fxn, sev dil LA, large circumferential pericardial eff w/o tamponade, triv MR, AoV sclerosis. Ao root 40mm; e.) TTE  02/28/2025: EF 60-65%, sev LVH, mod posterior LV effusion, AoV sclerosis   Chronic pain of both ankles    Coronary artery disease 02/26/2020   a. 02/2020 Ant STEMI/PCI: LM nl, LAD 20m (3.5x30 Resolute Onyx), RI nl, LCX min irregs, RCA min irregs. EF 65%.   DDD (degenerative disc disease), cervical    Diabetes mellitus, type II (HCC)    Diabetic neuropathy (HCC)    Esophagitis    ESRD (end stage renal disease) on dialysis (HCC)    a.) [facility]; T-Th-Sat   Frequent falls    Gait instability    Gastritis    Gastroparesis    GERD (gastroesophageal reflux disease)    H/O enucleation of left eyeball    Heart palpitations    History of kidney stones    HLD (hyperlipidemia)    Hypertension    Insomnia    a.) uses melatonin PRN   Long term current use of amiodarone    Nausea and vomiting in adult    recurrent   NSTEMI (non-ST elevated myocardial infarction) (HCC) 07/26/2021   a.) hyperkalemic from missed HD; seizures; decompensated to cardiac arrest and required CPR x 1 round prior to ROSC.  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  Added automatically from request for surgery [id number]     Osteomyelitis [facility])    Pericardial effusion    a. 11/2021 Echo: EF 65-70%, GrII DD, nl RV fxn, sev dil LA, large circumferential pericardial eff w/o tamponade, triv MR, AoV sclerosis. Ao root 40mm.   Seizure (HCC)    a.) last 07/26/2021 in setting of missed HD --> hyperkalemic at 6.5 --> pulseless/apneic and required CPR; discharged home on levetiracetam.   ST elevation myocardial infarction (STEMI) of anterior wall (HCC) 02/26/2020   a.) LHC/PCI 02/26/2020 --> EF 65%; LVEDP 11 mmHg; 90% mLAD (3.5 x 20 mm Resolute Onyx DES x 1)    Family History  Problem Relation Age of Onset   CAD Father    Stroke Father    Diabetes Mellitus II Mother    Kidney failure Mother    Schizophrenia Mother     Past Surgical History:   Procedure Laterality Date   A/V FISTULAGRAM Left 08/31/2021   Procedure: A/V Fistulagram;  Surgeon: Annice Needy, MD;  Location: [name] INVASIVE CV LAB;  Service: Cardiovascular;  Laterality: Left;   A/V FISTULAGRAM Left 01/25/2022   Procedure: A/V Fistulagram;  Surgeon: Annice Needy, MD;  Location: [name] INVASIVE CV LAB;  Service: Cardiovascular;  Laterality: Left;   A/V FISTULAGRAM Right 10/18/2022   Procedure: A/V Fistulagram;  Surgeon: Annice Needy, MD;  Location: [name] INVASIVE CV LAB;  Service: Cardiovascular;  Laterality: Right;   ABDOMINAL AORTOGRAM W/LOWER EXTREMITY Bilateral 02/08/2023   Procedure: ABDOMINAL AORTOGRAM W/LOWER EXTREMITY;  Surgeon: Daria Pastures, MD;  Location: [facility] INVASIVE CV LAB;  Service: Vascular;  Laterality: Bilateral;   AMPUTATION Left 03/16/2016   Procedure: AMPUTATION DIGIT LEFT HALLUX;  Surgeon: Vivi Barrack, DPM;  Location: [facility] OR;  Service: Podiatry;  Laterality: Left;  can start around 5    AMPUTATION Right 02/09/2021   Procedure: AMPUTATION DIGIT;  Surgeon: Marlyne Beards, MD;  Location: [facility] OR;  Service: Orthopedics;  Laterality: Right;   AMPUTATION Right 02/04/2023   Procedure: AMPUTATION 4TH AND 5TH METATARSAL;  Surgeon: Pilar Plate, DPM;  Location: [facility] OR;  Service: Orthopedics/Podiatry;  Laterality: Right;  I&D, partial ray resection, abx beads   AMPUTATION Right 02/09/2023   Procedure: RIGHT BELOW KNEE AMPUTATION;  Surgeon: [name], MD;  Location: [facility] OR;  Service: Orthopedics;  Laterality: Right;   AMPUTATION Right 04/06/2023   Procedure: RIGHT ABOVE KNEE AMPUTATION;  Surgeon: [name], MD;  Location[facility] OR;  Service: Orthopedics;  Laterality: Right;   AMPUTATION TOE Right 02/28/2022   Procedure: AMPUTATION TOE;  Surgeon: Louann Sjogren, DPM;  Location: [name] ORS;  Service: Podiatry;  Laterality: Right;  right fifth toe   ARTHROSCOPIC REPAIR ACL Left    AV FISTULA PLACEMENT Right 05/31/2019   Procedure: Brachiocephalic AV  fistula creation;  Surgeon: Annice Needy, MD;  Location: [name] ORS;  Service: Vascular;  Laterality: Right;   AV FISTULA PLACEMENT Left 08/13/2021   Procedure: ARTERIOVENOUS (AV) FISTULA CREATION (BRACHIALCEPHALIC);  Surgeon: Annice Needy, MD;  Location: [name] ORS;  Service: Vascular;  Laterality: Left;   COLONOSCOPY WITH PROPOFOL N/A 10/28/2015   Procedure: COLONOSCOPY WITH PROPOFOL;  Surgeon: Christena Deem, MD;  Location: [facility] ENDOSCOPY;  Service: Endoscopy;  Laterality: N/A;   COLONOSCOPY WITH PROPOFOL N/A 10/29/2015   Procedure: COLONOSCOPY WITH PROPOFOL;  Surgeon: Christena Deem, MD;  Location: [facility]  ENDOSCOPY;  Service: Endoscopy;  Laterality: N/A;   COLONOSCOPY WITH PROPOFOL N/A 01/27/2023   Procedure: COLONOSCOPY WITH PROPOFOL;  Surgeon: Franky Macho, MD;  Location: [facility] ENDO SUITE;  Service: Endoscopy;  Laterality: N/A;  12:30pm, asa 3   CORONARY ULTRASOUND/IVUS N/A 02/26/2020   Procedure: Intravascular Ultrasound/IVUS;  Surgeon: Corky Crafts, MD;  Location: [facility] INVASIVE CV LAB;  Service: Cardiovascular;  Laterality: N/A;   CORONARY/GRAFT ACUTE MI REVASCULARIZATION N/A 02/26/2020   Procedure: Coronary/Graft Acute MI Revascularization;  Surgeon: Corky Crafts, MD;  Location: [facility] INVASIVE CV LAB;  Service: Cardiovascular;  Laterality: N/A;   DIALYSIS/PERMA CATHETER INSERTION Right 04/26/2019   Perm Cath    DIALYSIS/PERMA CATHETER INSERTION N/A 04/26/2019   Procedure: DIALYSIS/PERMA CATHETER INSERTION;  Surgeon: Annice Needy, MD;  Location: [name] INVASIVE CV LAB;  Service: Cardiovascular;  Laterality: N/A;   DIALYSIS/PERMA CATHETER INSERTION N/A 05/25/2021   Procedure: DIALYSIS/PERMA CATHETER INSERTION;  Surgeon: Annice Needy, MD;  Location: [name] INVASIVE CV LAB;  Service: Cardiovascular;  Laterality: N/A;   DIALYSIS/PERMA CATHETER INSERTION N/A 08/31/2021   Procedure: DIALYSIS/PERMA CATHETER INSERTION;  Surgeon: Annice Needy, MD;  Location: [name] INVASIVE CV LAB;  Service:  Cardiovascular;  Laterality: N/A;   DIALYSIS/PERMA CATHETER REMOVAL N/A 08/15/2019   Procedure: DIALYSIS/PERMA CATHETER REMOVAL;  Surgeon: Renford Dills, MD;  Location: [name] INVASIVE CV LAB;  Service: Cardiovascular;  Laterality: N/A;   DIALYSIS/PERMA CATHETER REMOVAL N/A 03/15/2022   Procedure: DIALYSIS/PERMA CATHETER REMOVAL;  Surgeon: Annice Needy, MD;  Location: [name] INVASIVE CV LAB;  Service: Cardiovascular;  Laterality: N/A;   EMBOLIZATION (CATH LAB) Right 02/06/2021   Procedure: EMBOLIZATION;  Surgeon: Annice Needy, MD;  Location: [name] INVASIVE CV LAB;  Service: Cardiovascular;  Laterality: Right;  Right Upper Extremity Dialysis Access, Permcath Placement   ESOPHAGOGASTRODUODENOSCOPY (EGD) WITH PROPOFOL N/A 12/27/2017   Procedure: ESOPHAGOGASTRODUODENOSCOPY (EGD) WITH PROPOFOL;  Surgeon: Toledo, Boykin Nearing, MD;  Location: [name] ENDOSCOPY;  Service: Gastroenterology;  Laterality: N/A;   EYE SURGERY     LEFT HEART CATH AND CORONARY ANGIOGRAPHY N/A 02/26/2020   Procedure: LEFT HEART CATH AND CORONARY ANGIOGRAPHY;  Surgeon: Corky Crafts, MD;  Location: [facility] INVASIVE CV LAB;  Service: Cardiovascular;  Laterality: N/A;   PROSTATE SURGERY  2016   REVISON OF ARTERIOVENOUS FISTULA Right 07/22/2022   Procedure: RESECTION OF ANEURYSMAL RIGHT ARM ARTERIOVENOUS FISTULA;  Surgeon: Annice Needy, MD;  Location: [name] ORS;  Service: Vascular;  Laterality: Right;   TONSILECTOMY/ADENOIDECTOMY WITH MYRINGOTOMY     TONSILLECTOMY     UPPER EXTREMITY ANGIOGRAPHY Right 11/26/2020   Procedure: Upper Extremity Angiography;  Surgeon: Annice Needy, MD;  Location: [name] INVASIVE CV LAB;  Service: Cardiovascular;  Laterality: Right;   UPPER EXTREMITY ANGIOGRAPHY Right 02/05/2021   Procedure: UPPER EXTREMITY ANGIOGRAPHY;  Surgeon: Annice Needy, MD;  Location: [name] INVASIVE CV LAB;  Service: Cardiovascular;  Laterality: Right;   Social History   Occupational History   Occupation: Disability    Comment: not  employed  Tobacco Use   Smoking status: Never   Smokeless tobacco: Never  Vaping Use   Vaping status: Never Used  Substance and Sexual Activity   Alcohol use: No   Drug use: No   Sexual activity: Yes

## 2023-04-27 NOTE — Telephone Encounter (Signed)
 Shrinker Rx sent to [facility]. Rehab center informed.

## 2023-05-02 ENCOUNTER — Ambulatory Visit (INDEPENDENT_AMBULATORY_CARE_PROVIDER_SITE_OTHER): Admitting: Orthopedic Surgery

## 2023-05-02 ENCOUNTER — Encounter: Payer: Self-pay | Admitting: Orthopedic Surgery

## 2023-05-02 DIAGNOSIS — T8781 Dehiscence of amputation stump: Secondary | ICD-10-CM

## 2023-05-02 DIAGNOSIS — Z89611 Acquired absence of right leg above knee: Secondary | ICD-10-CM

## 2023-05-02 DIAGNOSIS — S78111A Complete traumatic amputation at level between right hip and knee, initial encounter: Secondary | ICD-10-CM

## 2023-05-02 NOTE — Progress Notes (Signed)
 Office Visit Note   Patient: [name].           Date of Birth: [birth year]           MRN: [id number] Visit Date: 05/02/2023              Requested by: [name], FNP [address] [place],  [place] [address] PCP: [name], FNP  Chief Complaint  Patient presents with   Right Leg - Routine Post Op    04/06/2023 right AKA      HPI: Patient is a 53 year old gentleman who presents in follow-up for right above-knee amputation.  Patient states that he has had an opening of the wound and drainage.  Assessment & Plan: Visit Diagnoses:  1. Above knee amputation of right lower extremity (HCC)   2. Dehiscence of amputation stump (HCC)     Plan: Patient is provided a written prescription for doxycycline to use at the skilled nursing facility.  This wound should be cleansed daily with soap and water dry dressing applied daily.  Reevaluate in 1 week.  Discussed with the patient and family over the phone that we most likely will need to proceed with a revision of the amputation.  Follow-Up Instructions: Return in about 1 week (around 05/09/2023).   Ortho Exam  Patient is alert, oriented, no adenopathy, well-dressed, normal affect, normal respiratory effort. Examination there is dehiscence of the above-knee amputation.  There is no purulent drainage there is a wound with necrotic tissue at the base.  No exposed bone.  Imaging: No results found. No images are attached to the encounter.  Labs: Lab Results  Component Value Date   HGBA1C 4.5 (L) 11/21/2021   HGBA1C 4.7 (L) 04/21/2021   HGBA1C 5.7 (H) 02/13/2021   ESRSEDRATE 9 02/27/2022   ESRSEDRATE 97 (H) 02/05/2021   ESRSEDRATE 22 (H) 06/24/2015   CRP <0.5 02/27/2022   CRP 3.6 (H) 02/05/2021   CRP 0.6 06/24/2015   REPTSTATUS 02/09/2023 FINAL 02/04/2023   REPTSTATUS 02/09/2023 FINAL 02/04/2023   GRAMSTAIN  02/04/2023    FEW WBC PRESENT, PREDOMINANTLY PMN ABUNDANT GRAM POSITIVE COCCI IN PAIRS RARE GRAM  NEGATIVE COCCOBACILLI    GRAMSTAIN  02/04/2023    MODERATE WBC PRESENT, PREDOMINANTLY PMN MODERATE GRAM POSITIVE COCCI    CULT  02/04/2023    ABUNDANT STAPHYLOCOCCUS AUREUS MODERATE CORYNEBACTERIUM STRIATUM Standardized susceptibility testing for this organism is not available. NO ANAEROBES ISOLATED Performed at [facility], [address], [place], [place] [address]    CULT  02/04/2023    ABUNDANT STAPHYLOCOCCUS AUREUS SUSCEPTIBILITIES PERFORMED ON PREVIOUS CULTURE WITHIN THE LAST 5 DAYS. MODERATE CORYNEBACTERIUM STRIATUM Standardized susceptibility testing for this organism is not available. NO ANAEROBES ISOLATED Performed at [facility], [address], [place], [place] [address]    [facility] STAPHYLOCOCCUS AUREUS 02/04/2023     Lab Results  Component Value Date   ALBUMIN 2.3 (L) 04/14/2023   ALBUMIN 2.4 (L) 04/13/2023   ALBUMIN 2.5 (L) 04/12/2023   PREALBUMIN 22 02/27/2022    Lab Results  Component Value Date   MG 1.8 02/05/2023   MG 2.4 02/03/2023   MG 1.9 02/27/2022   No results found for: "VD25OH"  Lab Results  Component Value Date   PREALBUMIN 22 02/27/2022      Latest Ref Rng & Units 04/14/2023   11:30 AM 04/13/2023    7:34 AM 04/12/2023    7:57 AM  CBC EXTENDED  WBC 4.0 -  10.5 K/uL 11.2  11.8  12.9   RBC 4.22 - 5.81 MIL/uL 3.44  3.66  3.75   Hemoglobin 13.0 - 17.0 g/dL 7.8  8.4  8.6   HCT 16.1 - 52.0 % 25.4  27.3  28.1   Platelets 150 - 400 K/uL 157  163  178      There is no height or weight on file to calculate BMI.  Orders:  No orders of the defined types were placed in this encounter.  No orders of the defined types were placed in this encounter.    Procedures: No procedures performed  Clinical Data: No additional findings.  ROS:  All other systems negative, except as noted in the HPI. Review of Systems  Objective: Vital Signs: There were no vitals taken for this visit.  Specialty Comments:  No specialty comments  available.  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) 08/19/2020   Anxiety state 08/19/2020   Callosity 08/19/2020   ED (erectile dysfunction) of organic origin 08/19/2020   Long term (current) use of insulin (HCC) 08/19/2020   Myalgia 08/19/2020   CAD/ H/o prior STEMI 02/2020 s/p DES to LAD 08/19/2020   Blindness of left eye 08/15/2020   Combined forms of age-related cataract of right eye 04/17/2020   Hypertension secondary to other renal disorders 07/20/2019   Moderate protein-calorie malnutrition (HCC) 05/14/2019   Chronic anemia 05/07/2019   Coagulation defect, unspecified (HCC) 05/07/2019   Disorder of phosphorus metabolism, unspecified 05/07/2019    Iron deficiency anemia, unspecified 05/07/2019   Secondary hyperparathyroidism of renal origin (HCC) 11/28/2018   Chronic Grade 2 diastolic heart failure/EF 55 to 60% 12/13/2017   Diabetes mellitus, type II (HCC) 11/23/2017   Diabetic retinopathy (HCC) 07/05/2017   Uncontrolled type 2 diabetes mellitus with hyperglycemia, with long-term current use of insulin (HCC) 04/20/2016   Mixed hyperlipidemia    Essential hypertension, benign    Past Medical History:  Diagnosis Date   Acute Bil  CVAs of Bil Frontal amd Rt Parietal  in Watershed Distribution 04/21/2021   Patchy acute cortically-based infarcts within the bilateral high  frontal lobes and right parietal lobe (predominantly in a watershed  distribution).   Subcentimeter acute infarct within the callosal splenium on the  left.  Acute bilateral cerebral infarction in a watershed distribution [facility]) 04/21/2021   a.) MRI brain 04/21/2021 --> patchy ACUTE cortically-based infarcts within the bilateral high frontal lobes and right parietal lobe   Acute cerebral infarction (HCC) 02/13/2021   a.) MRI brain 02/13/2021: acute LEFT hippocampal infarct   Acute cerebral infarction (HCC) 04/21/2021   a.) MRI brain 04/21/2021: subcentimeter ACUTE infarct within the callosal splenium on the LEFT   Acute osteomyelitis of toe of right foot with gangrene (HCC) 02/26/2022   Anaphylactic reaction due to adverse effect of correct drug or medicament properly administered, initial encounter 05/08/2019   Anemia of chronic renal failure    Anxiety    Aortic dilatation (HCC) 11/25/2020   a.) TTE 11/25/2020: Ao root measured 38 mm. b.) TTE 04/22/2021: Ao root measured 44 mm; c. 11/2021 Echo: Ao root 40mm.   Atrial fibrillation (HCC)    a.) CHA2DS2-VASc = 6 (CHF, HTN, CVA x2, prior MI, T2DM). b.) rate/rhythm maintained on oral amiodarone + carvedilol; chronic OAC/AP therapy discontinued following ICH   Benign prostatic hyperplasia with lower urinary tract symptoms  11/07/2019   BPH (benign prostatic hyperplasia)    Cardiac arrest (HCC) 07/26/2021   a.) in setting of hyperkalemia, NSTEMI, and seizure following missed HD; pulseless and apneic --> required 1 round of CPR prior to ROSC   Cellulitis    Cerebral hemorrhage (HCC) 11/21/2021   a.) CT head 11/21/2021 --> 1.4 x 1.5 x 3.1 cm ACUTE hemorrhage within the inferior aspect of the fourth ventricle, and extending inferiorly through the foramen of foramen of [place] --> occurred in setting of HTN emergency and DOAC/APT-->eliquis/plavix d/c'd.   Cerebral microvascular disease    Chronic diarrhea    Chronic heart failure with preserved ejection fraction (HFpEF) (HCC) 07/19/2017   a.) 06/2017 Echo: EF 75%; b.) 11/2020 Echo: EF 55-60%; c.) 04/2021 Echo: EF 55-60%; d.) 11/2021 Echo: EF 65-70%, GrII DD, nl RV fxn, sev dil LA, large circumferential pericardial eff w/o tamponade, triv MR, AoV sclerosis. Ao root 40mm; e.) TTE 02/28/2025: EF 60-65%, sev LVH, mod posterior LV effusion, AoV sclerosis   Chronic pain of both ankles    Coronary artery disease 02/26/2020   a. 02/2020 Ant STEMI/PCI: LM nl, LAD 13m (3.5x30 Resolute Onyx), RI nl, LCX min irregs, RCA min irregs. EF 65%.   DDD (degenerative disc disease), cervical    Diabetes mellitus, type II (HCC)    Diabetic neuropathy (HCC)    Esophagitis    ESRD (end stage renal disease) on dialysis (HCC)    a.) [facility]; T-Th-Sat   Frequent falls    Gait instability    Gastritis    Gastroparesis    GERD (gastroesophageal reflux disease)    H/O enucleation of left eyeball    Heart palpitations    History of kidney stones    HLD (hyperlipidemia)    Hypertension    Insomnia    a.) uses melatonin PRN   Long term current use of amiodarone    Nausea and vomiting in adult    recurrent   NSTEMI (non-ST elevated myocardial infarction) (HCC) 07/26/2021   a.) hyperkalemic from missed HD; seizures; decompensated to cardiac arrest and required CPR x 1 round prior to ROSC.    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  Added automatically from request for surgery [id number]     Osteomyelitis (HCC)    Pericardial effusion    a. 11/2021 Echo: EF  65-70%, GrII DD, nl RV fxn, sev dil LA, large circumferential pericardial eff w/o tamponade, triv MR, AoV sclerosis. Ao root 40mm.   Seizure (HCC)    a.) last 07/26/2021 in setting of missed HD --> hyperkalemic at 6.5 --> pulseless/apneic and required CPR; discharged home on levetiracetam.   ST elevation myocardial infarction (STEMI) of anterior wall (HCC) 02/26/2020   a.) LHC/PCI 02/26/2020 --> EF 65%; LVEDP 11 mmHg; 90% mLAD (3.5 x 20 mm Resolute Onyx DES x 1)    Family History  Problem Relation Age of Onset   CAD Father    Stroke Father    Diabetes Mellitus II Mother    Kidney failure Mother    Schizophrenia Mother     Past Surgical History:  Procedure Laterality Date   A/V FISTULAGRAM Left 08/31/2021   Procedure: A/V Fistulagram;  Surgeon: Annice Needy, MD;  Location: [name] INVASIVE CV LAB;  Service: Cardiovascular;  Laterality: Left;   A/V FISTULAGRAM Left 01/25/2022   Procedure: A/V Fistulagram;  Surgeon: Annice Needy, MD;  Location: [name] INV[facility];  Service: Cardiovascular;  Laterality: Left;   A/V FISTULAGRAM Right 10/18/2022   Procedure: A/V Fistulagram;  Surgeon: Annice Needy, MD;  Location: [name] INV[facility] LAB;  Service: Cardiovascular;  Laterality: Right;   ABDOMINAL AORTOGRAM W/LOWER EXTREMITY Bilateral 02/08/2023   Procedure: ABDOMINAL AORTOGRAM W/LOWER EXTREMITY;  Surgeon: Daria Pastures, MD;  Location: [facility] INVASIVE CV LAB;  Service: Vascular;  Laterality: Bilateral;   AMPUTATION Left 03/16/2016   Procedure: AMPUTATION DIGIT LEFT HALLUX;  Surgeon: Vivi Barrack, DPM;  Location: [facility] OR;  Service: Podiatry;  Laterality: Left;  can start around 5    AMPUTATION Right 02/09/2021   Procedure: AMPUTATION DIGIT;  Surgeon:  Marlyne Beards, MD;  Location: [facility] OR;  Service: Orthopedics;  Laterality: Right;   AMPUTATION Right 02/04/2023   Procedure: AMPUTATION 4TH AND 5TH METATARSAL;  Surgeon: Pilar Plate, DPM;  Location: [facility] OR;  Service: Orthopedics/Podiatry;  Laterality: Right;  I&D, partial ray resection, abx beads   AMPUTATION Right 02/09/2023   Procedure: RIGHT BELOW KNEE AMPUTATION;  Surgeon: [name], MD;  Location: [facility] OR;  Service: Orthopedics;  Laterality: Right;   AMPUTATION Right 04/06/2023   Procedure: RIGHT ABOVE KNEE AMPUTATION;  Surgeon: [name], MD;  Location: [facility] OR;  Service: Orthopedics;  Laterality: Right;   AMPUTATION TOE Right 02/28/2022   Procedure: AMPUTATION TOE;  Surgeon: Louann Sjogren, DPM;  Location: [name] ORS;  Service: Podiatry;  Laterality: Right;  right fifth toe   ARTHROSCOPIC REPAIR ACL Left    AV FISTULA PLACEMENT Right 05/31/2019   Procedure: Brachiocephalic AV fistula creation;  Surgeon: Annice Needy, MD;  Location: [name] ORS;  Service: Vascular;  Laterality: Right;   AV FISTULA PLACEMENT Left 08/13/2021   Procedure: ARTERIOVENOUS (AV) FISTULA CREATION (BRACHIALCEPHALIC);  Surgeon: Annice Needy, MD;  Location: [name] ORS;  Service: Vascular;  Laterality: Left;   COLONOSCOPY WITH PROPOFOL N/A 10/28/2015   Procedure: COLONOSCOPY WITH PROPOFOL;  Surgeon: Christena Deem, MD;  Location: [facility] ENDOSCOPY;  Service: Endoscopy;  Laterality: N/A;   COLONOSCOPY WITH PROPOFOL N/A 10/29/2015   Procedure: COLONOSCOPY WITH PROPOFOL;  Surgeon: Christena Deem, MD;  Location: [facility] ENDOSCOPY;  Service: Endoscopy;  Laterality: N/A;   COLONOSCOPY WITH PROPOFOL N/A 01/27/2023   Procedure: COLONOSCOPY WITH PROPOFOL;  Surgeon: Franky Macho, MD;  Location: [facility]O [facility];  Service: Endoscopy;  Laterality: N/A;  12:30pm, asa 3   CORONARY ULTRASOUND/IVUS N/A  02/26/2020   Procedure: Intravascular Ultrasound/IVUS;  Surgeon: Corky Crafts, MD;  Location: [facility] INVASIVE CV LAB;   Service: Cardiovascular;  Laterality: N/A;   CORONARY/GRAFT ACUTE MI REVASCULARIZATION N/A 02/26/2020   Procedure: Coronary/Graft Acute MI Revascularization;  Surgeon: Corky Crafts, MD;  Location: [facility] INVASIVE CV LAB;  Service: Cardiovascular;  Laterality: N/A;   DIALYSIS/PERMA CATHETER INSERTION Right 04/26/2019   Perm Cath    DIALYSIS/PERMA CATHETER INSERTION N/A 04/26/2019   Procedure: DIALYSIS/PERMA CATHETER INSERTION;  Surgeon: Annice Needy, MD;  Location: [name] INVASIVE CV LAB;  Service: Cardiovascular;  Laterality: N/A;   DIALYSIS/PERMA CATHETER INSERTION N/A 05/25/2021   Procedure: DIALYSIS/PERMA CATHETER INSERTION;  Surgeon: Annice Needy, MD;  Location: [name] INV[facility] CV LAB;  Service: Cardiovascular;  Laterality: N/A;   DIALYSIS/PERMA CATHETER INSERTION N/A 08/31/2021   Procedure: DIALYSIS/PERMA CATHETER INSERTION;  Surgeon: Annice Needy, MD;  Location: [name] INV[facility] CV LAB;  Service: Cardiovascular;  Laterality: N/A;   DIALYSIS/PERMA CATHETER REMOVAL N/A 08/15/2019   Procedure: DIALYSIS/PERMA CATHETER REMOVAL;  Surgeon: Renford Dills, MD;  Location: [name] INVASIVE CV LAB;  Service: Cardiovascular;  Laterality: N/A;   DIALYSIS/PERMA CATHETER REMOVAL N/A 03/15/2022   Procedure: DIALYSIS/PERMA CATHETER REMOVAL;  Surgeon: Annice Needy, MD;  Location: [name] INV[facility] CV LAB;  Service: Cardiovascular;  Laterality: N/A;   EMBOLIZATION (CATH LAB) Right 02/06/2021   Procedure: EMBOLIZATION;  Surgeon: Annice Needy, MD;  Location: [name] INV[facility] CV LAB;  Service: Cardiovascular;  Laterality: Right;  Right Upper Extremity Dialysis Access, Permcath Placement   ESOPHAGOGASTRODUODENOSCOPY (EGD) WITH PROPOFOL N/A 12/27/2017   Procedure: ESOPHAGOGASTRODUODENOSCOPY (EGD) WITH PROPOFOL;  Surgeon: Toledo, Boykin Nearing, MD;  Location: [name] ENDOSCOPY;  Service: Gastroenterology;  Laterality: N/A;   EYE SURGERY     LEFT HEART CATH AND CORONARY ANGIOGRAPHY N/A 02/26/2020   Procedure: LEFT HEART CATH AND  CORONARY ANGIOGRAPHY;  Surgeon: Corky Crafts, MD;  Location: [facility] INVASIVE CV LAB;  Service: Cardiovascular;  Laterality: N/A;   PROSTATE SURGERY  2016   REVISON OF ARTERIOVENOUS FISTULA Right 07/22/2022   Procedure: RESECTION OF ANEURYSMAL RIGHT ARM ARTERIOVENOUS FISTULA;  Surgeon: Annice Needy, MD;  Location: [name] ORS;  Service: Vascular;  Laterality: Right;   TONSILECTOMY/ADENOIDECTOMY WITH MYRINGOTOMY     TONSILLECTOMY     UPPER EXTREMITY ANGIOGRAPHY Right 11/26/2020   Procedure: Upper Extremity Angiography;  Surgeon: Annice Needy, MD;  Location: [name] INV[facility] LAB;  Service: Cardiovascular;  Laterality: Right;   UPPER EXTREMITY ANGIOGRAPHY Right 02/05/2021   Procedure: UPPER EXTREMITY ANGIOGRAPHY;  Surgeon: Annice Needy, MD;  Location: [name] INVASIVE CV LAB;  Service: Cardiovascular;  Laterality: Right;   Social History   Occupational History   Occupation: Disability    Comment: not employed  Tobacco Use   Smoking status: Never   Smokeless tobacco: Never  Vaping Use   Vaping status: Never Used  Substance and Sexual Activity   Alcohol use: No   Drug use: No   Sexual activity: Yes

## 2023-05-04 ENCOUNTER — Telehealth: Payer: Self-pay

## 2023-05-04 ENCOUNTER — Telehealth: Payer: Self-pay | Admitting: Orthopedic Surgery

## 2023-05-04 NOTE — Telephone Encounter (Signed)
 [name] called and states that facility MD advised that the pt has infection down to the bone. Is afraid that he needs to be seen in the office. I advised that we saw the pt on Monday and that [name] gave rx for ABX which she confirmed the pt is taking and advised of daily wound care but that the pt would most likely need a revision of his amputation. Advised that if the pt is running a fever or worsening in any way he should proceed to the ER. Otherwise he can continue with current care and keep appt next week for follow up and possibly post for surgery. Advised to call with any other questions and or concerns.

## 2023-05-05 ENCOUNTER — Inpatient Hospital Stay (HOSPITAL_COMMUNITY)

## 2023-05-05 ENCOUNTER — Other Ambulatory Visit: Payer: Self-pay

## 2023-05-05 ENCOUNTER — Telehealth: Payer: Self-pay

## 2023-05-05 ENCOUNTER — Emergency Department (HOSPITAL_COMMUNITY)

## 2023-05-05 ENCOUNTER — Inpatient Hospital Stay (HOSPITAL_COMMUNITY)
Admission: EM | Admit: 2023-05-05 | Discharge: 2023-05-17 | DRG: 981 | Disposition: A | Discharge status: Home Health | Source: Other Acute Inpatient Hospital | Attending: Internal Medicine | Admitting: Internal Medicine

## 2023-05-05 ENCOUNTER — Encounter (HOSPITAL_COMMUNITY): Payer: Self-pay

## 2023-05-05 DIAGNOSIS — Z1152 Encounter for screening for COVID-19: Secondary | ICD-10-CM | POA: Diagnosis not present

## 2023-05-05 DIAGNOSIS — D62 Acute posthemorrhagic anemia: Secondary | ICD-10-CM | POA: Diagnosis present

## 2023-05-05 DIAGNOSIS — I10 Essential (primary) hypertension: Secondary | ICD-10-CM | POA: Diagnosis not present

## 2023-05-05 DIAGNOSIS — Z833 Family history of diabetes mellitus: Secondary | ICD-10-CM

## 2023-05-05 DIAGNOSIS — L8990 Pressure ulcer of unspecified site, unspecified stage: Secondary | ICD-10-CM | POA: Diagnosis present

## 2023-05-05 DIAGNOSIS — A0472 Enterocolitis due to Clostridium difficile, not specified as recurrent: Secondary | ICD-10-CM | POA: Diagnosis present

## 2023-05-05 DIAGNOSIS — I132 Hypertensive heart and chronic kidney disease with heart failure and with stage 5 chronic kidney disease, or end stage renal disease: Secondary | ICD-10-CM | POA: Diagnosis present

## 2023-05-05 DIAGNOSIS — E11649 Type 2 diabetes mellitus with hypoglycemia without coma: Secondary | ICD-10-CM | POA: Diagnosis not present

## 2023-05-05 DIAGNOSIS — G40909 Epilepsy, unspecified, not intractable, without status epilepticus: Secondary | ICD-10-CM | POA: Diagnosis present

## 2023-05-05 DIAGNOSIS — F419 Anxiety disorder, unspecified: Secondary | ICD-10-CM | POA: Diagnosis present

## 2023-05-05 DIAGNOSIS — Z8674 Personal history of sudden cardiac arrest: Secondary | ICD-10-CM

## 2023-05-05 DIAGNOSIS — Z823 Family history of stroke: Secondary | ICD-10-CM

## 2023-05-05 DIAGNOSIS — N2581 Secondary hyperparathyroidism of renal origin: Secondary | ICD-10-CM | POA: Diagnosis present

## 2023-05-05 DIAGNOSIS — K219 Gastro-esophageal reflux disease without esophagitis: Secondary | ICD-10-CM | POA: Diagnosis present

## 2023-05-05 DIAGNOSIS — H5462 Unqualified visual loss, left eye, normal vision right eye: Secondary | ICD-10-CM | POA: Diagnosis present

## 2023-05-05 DIAGNOSIS — Z888 Allergy status to other drugs, medicaments and biological substances status: Secondary | ICD-10-CM

## 2023-05-05 DIAGNOSIS — L89620 Pressure ulcer of left heel, unstageable: Secondary | ICD-10-CM | POA: Diagnosis present

## 2023-05-05 DIAGNOSIS — Z841 Family history of disorders of kidney and ureter: Secondary | ICD-10-CM

## 2023-05-05 DIAGNOSIS — Z5320 Procedure and treatment not carried out because of patient's decision for unspecified reasons: Secondary | ICD-10-CM | POA: Diagnosis present

## 2023-05-05 DIAGNOSIS — Z992 Dependence on renal dialysis: Secondary | ICD-10-CM

## 2023-05-05 DIAGNOSIS — E782 Mixed hyperlipidemia: Secondary | ICD-10-CM | POA: Diagnosis present

## 2023-05-05 DIAGNOSIS — I739 Peripheral vascular disease, unspecified: Secondary | ICD-10-CM | POA: Diagnosis present

## 2023-05-05 DIAGNOSIS — Z794 Long term (current) use of insulin: Secondary | ICD-10-CM

## 2023-05-05 DIAGNOSIS — Z89611 Acquired absence of right leg above knee: Secondary | ICD-10-CM

## 2023-05-05 DIAGNOSIS — E1122 Type 2 diabetes mellitus with diabetic chronic kidney disease: Secondary | ICD-10-CM | POA: Diagnosis present

## 2023-05-05 DIAGNOSIS — I5032 Chronic diastolic (congestive) heart failure: Secondary | ICD-10-CM | POA: Diagnosis present

## 2023-05-05 DIAGNOSIS — Z89511 Acquired absence of right leg below knee: Secondary | ICD-10-CM

## 2023-05-05 DIAGNOSIS — D631 Anemia in chronic kidney disease: Secondary | ICD-10-CM | POA: Diagnosis present

## 2023-05-05 DIAGNOSIS — E1165 Type 2 diabetes mellitus with hyperglycemia: Secondary | ICD-10-CM | POA: Diagnosis present

## 2023-05-05 DIAGNOSIS — E1143 Type 2 diabetes mellitus with diabetic autonomic (poly)neuropathy: Secondary | ICD-10-CM | POA: Diagnosis present

## 2023-05-05 DIAGNOSIS — I69331 Monoplegia of upper limb following cerebral infarction affecting right dominant side: Secondary | ICD-10-CM | POA: Diagnosis not present

## 2023-05-05 DIAGNOSIS — N186 End stage renal disease: Secondary | ICD-10-CM | POA: Diagnosis present

## 2023-05-05 DIAGNOSIS — H5440 Blindness, one eye, unspecified eye: Secondary | ICD-10-CM | POA: Diagnosis present

## 2023-05-05 DIAGNOSIS — T8781 Dehiscence of amputation stump: Secondary | ICD-10-CM | POA: Diagnosis present

## 2023-05-05 DIAGNOSIS — R569 Unspecified convulsions: Secondary | ICD-10-CM

## 2023-05-05 DIAGNOSIS — Z79899 Other long term (current) drug therapy: Secondary | ICD-10-CM

## 2023-05-05 DIAGNOSIS — I2510 Atherosclerotic heart disease of native coronary artery without angina pectoris: Secondary | ICD-10-CM | POA: Diagnosis present

## 2023-05-05 DIAGNOSIS — Y835 Amputation of limb(s) as the cause of abnormal reaction of the patient, or of later complication, without mention of misadventure at the time of the procedure: Secondary | ICD-10-CM | POA: Diagnosis present

## 2023-05-05 DIAGNOSIS — E875 Hyperkalemia: Secondary | ICD-10-CM | POA: Diagnosis present

## 2023-05-05 DIAGNOSIS — R296 Repeated falls: Secondary | ICD-10-CM | POA: Diagnosis present

## 2023-05-05 DIAGNOSIS — J90 Pleural effusion, not elsewhere classified: Secondary | ICD-10-CM | POA: Diagnosis not present

## 2023-05-05 DIAGNOSIS — I151 Hypertension secondary to other renal disorders: Secondary | ICD-10-CM

## 2023-05-05 DIAGNOSIS — Z91158 Patient's noncompliance with renal dialysis for other reason: Secondary | ICD-10-CM

## 2023-05-05 DIAGNOSIS — E1151 Type 2 diabetes mellitus with diabetic peripheral angiopathy without gangrene: Secondary | ICD-10-CM | POA: Diagnosis present

## 2023-05-05 DIAGNOSIS — Z5986 Financial insecurity: Secondary | ICD-10-CM

## 2023-05-05 DIAGNOSIS — Z7982 Long term (current) use of aspirin: Secondary | ICD-10-CM

## 2023-05-05 DIAGNOSIS — N401 Enlarged prostate with lower urinary tract symptoms: Secondary | ICD-10-CM | POA: Diagnosis present

## 2023-05-05 DIAGNOSIS — K3184 Gastroparesis: Secondary | ICD-10-CM | POA: Diagnosis present

## 2023-05-05 DIAGNOSIS — Z955 Presence of coronary angioplasty implant and graft: Secondary | ICD-10-CM

## 2023-05-05 DIAGNOSIS — E119 Type 2 diabetes mellitus without complications: Secondary | ICD-10-CM | POA: Diagnosis not present

## 2023-05-05 DIAGNOSIS — J690 Pneumonitis due to inhalation of food and vomit: Secondary | ICD-10-CM | POA: Diagnosis present

## 2023-05-05 DIAGNOSIS — Z87898 Personal history of other specified conditions: Secondary | ICD-10-CM

## 2023-05-05 DIAGNOSIS — I482 Chronic atrial fibrillation, unspecified: Secondary | ICD-10-CM | POA: Diagnosis present

## 2023-05-05 DIAGNOSIS — Z9001 Acquired absence of eye: Secondary | ICD-10-CM

## 2023-05-05 DIAGNOSIS — H4050X0 Glaucoma secondary to other eye disorders, unspecified eye, stage unspecified: Secondary | ICD-10-CM | POA: Diagnosis present

## 2023-05-05 DIAGNOSIS — Z8249 Family history of ischemic heart disease and other diseases of the circulatory system: Secondary | ICD-10-CM

## 2023-05-05 DIAGNOSIS — Z751 Person awaiting admission to adequate facility elsewhere: Secondary | ICD-10-CM

## 2023-05-05 DIAGNOSIS — Z818 Family history of other mental and behavioral disorders: Secondary | ICD-10-CM

## 2023-05-05 DIAGNOSIS — I252 Old myocardial infarction: Secondary | ICD-10-CM

## 2023-05-05 DIAGNOSIS — Z87442 Personal history of urinary calculi: Secondary | ICD-10-CM

## 2023-05-05 DIAGNOSIS — D649 Anemia, unspecified: Secondary | ICD-10-CM | POA: Diagnosis present

## 2023-05-05 DIAGNOSIS — T85398A Other mechanical complication of other ocular prosthetic devices, implants and grafts, initial encounter: Secondary | ICD-10-CM | POA: Diagnosis present

## 2023-05-05 DIAGNOSIS — E1169 Type 2 diabetes mellitus with other specified complication: Secondary | ICD-10-CM

## 2023-05-05 LAB — TYPE AND SCREEN
ABO/RH(D): A POS
Antibody Screen: NEGATIVE
Unit division: 0

## 2023-05-05 LAB — COMPREHENSIVE METABOLIC PANEL
ALT: 10 U/L (ref 0–44)
AST: 13 U/L — ABNORMAL LOW (ref 15–41)
Albumin: 2.3 g/dL — ABNORMAL LOW (ref 3.5–5.0)
Alkaline Phosphatase: 58 U/L (ref 38–126)
Anion gap: 14 (ref 5–15)
BUN: 53 mg/dL — ABNORMAL HIGH (ref 6–20)
CO2: 20 mmol/L — ABNORMAL LOW (ref 22–32)
Calcium: 9.1 mg/dL (ref 8.9–10.3)
Chloride: 103 mmol/L (ref 98–111)
Creatinine, Ser: 8.93 mg/dL — ABNORMAL HIGH (ref 0.61–1.24)
GFR, Estimated: 6 mL/min — ABNORMAL LOW (ref >60)
Glucose, Bld: 92 mg/dL (ref 70–99)
Potassium: 5.4 mmol/L — ABNORMAL HIGH (ref 3.5–5.1)
Sodium: 137 mmol/L (ref 135–145)
Total Bilirubin: 0.6 mg/dL (ref 0.0–1.2)
Total Protein: 6.5 g/dL (ref 6.5–8.1)

## 2023-05-05 LAB — CBC WITH DIFFERENTIAL/PLATELET
Abs Immature Granulocytes: 0.04 10*3/uL (ref 0.00–0.07)
Basophils Absolute: 0.1 10*3/uL (ref 0.0–0.1)
Basophils Relative: 1 %
Eosinophils Absolute: 0.5 10*3/uL (ref 0.0–0.5)
Eosinophils Relative: 7 %
HCT: 25 % — ABNORMAL LOW (ref 39.0–52.0)
Hemoglobin: 7.1 g/dL — ABNORMAL LOW (ref 13.0–17.0)
Immature Granulocytes: 1 %
Lymphocytes Relative: 13 %
Lymphs Abs: 1 10*3/uL (ref 0.7–4.0)
MCH: 20.4 pg — ABNORMAL LOW (ref 26.0–34.0)
MCHC: 28.4 g/dL — ABNORMAL LOW (ref 30.0–36.0)
MCV: 71.8 fL — ABNORMAL LOW (ref 80.0–100.0)
Monocytes Absolute: 1.1 10*3/uL — ABNORMAL HIGH (ref 0.1–1.0)
Monocytes Relative: 14 %
Neutro Abs: 5.3 10*3/uL (ref 1.7–7.7)
Neutrophils Relative %: 64 %
Platelets: 249 10*3/uL (ref 150–400)
RBC: 3.48 MIL/uL — ABNORMAL LOW (ref 4.22–5.81)
RDW: 21.5 % — ABNORMAL HIGH (ref 11.5–15.5)
WBC: 8.1 10*3/uL (ref 4.0–10.5)
nRBC: 0 % (ref 0.0–0.2)

## 2023-05-05 LAB — PREPARE RBC (CROSSMATCH)

## 2023-05-05 LAB — RESPIRATORY PANEL BY PCR

## 2023-05-05 LAB — LACTATE DEHYDROGENASE, PLEURAL OR PERITONEAL FLUID: LD, Fluid: 199 U/L — ABNORMAL HIGH (ref 3–23)

## 2023-05-05 LAB — RESP PANEL BY RT-PCR (RSV, FLU A&B, COVID)  RVPGX2
Influenza A by PCR: NEGATIVE
Influenza B by PCR: NEGATIVE
Resp Syncytial Virus by PCR: NEGATIVE
SARS Coronavirus 2 by RT PCR: NEGATIVE

## 2023-05-05 LAB — BODY FLUID CELL COUNT WITH DIFFERENTIAL
Eos, Fluid: 1 %
Lymphs, Fluid: 40 %
Monocyte-Macrophage-Serous Fluid: 39 % — ABNORMAL LOW (ref 50–90)
Neutrophil Count, Fluid: 20 % (ref 0–25)
Total Nucleated Cell Count, Fluid: 640 uL (ref 0–1000)

## 2023-05-05 LAB — GRAM STAIN

## 2023-05-05 LAB — BPAM RBC
Blood Product Expiration Date: 202503252359
Unit Type and Rh: 6200

## 2023-05-05 LAB — GLUCOSE, PLEURAL OR PERITONEAL FLUID: Glucose, Fluid: 82 mg/dL

## 2023-05-05 LAB — PROTEIN, PLEURAL OR PERITONEAL FLUID: Total protein, fluid: 3.8 g/dL

## 2023-05-05 LAB — GLUCOSE, CAPILLARY
Glucose-Capillary: 78 mg/dL (ref 70–99)
Glucose-Capillary: 106 mg/dL — ABNORMAL HIGH (ref 70–99)

## 2023-05-05 LAB — HEMOGLOBIN A1C
Hgb A1c MFr Bld: 5 % (ref 4.8–5.6)
Mean Plasma Glucose: 96.8 mg/dL

## 2023-05-05 LAB — ETHANOL: Alcohol, Ethyl (B): <10 mg/dL (ref <10)

## 2023-05-05 LAB — HEMOGLOBIN AND HEMATOCRIT, BLOOD
HCT: 30.8 % — ABNORMAL LOW (ref 39.0–52.0)
Hemoglobin: 8.7 g/dL — ABNORMAL LOW (ref 13.0–17.0)

## 2023-05-05 LAB — C-REACTIVE PROTEIN: CRP: 9.9 mg/dL — ABNORMAL HIGH (ref <1.0)

## 2023-05-05 LAB — PHOSPHORUS: Phosphorus: 6.8 mg/dL — ABNORMAL HIGH (ref 2.5–4.6)

## 2023-05-05 LAB — SEDIMENTATION RATE: Sed Rate: 74 mm/h — ABNORMAL HIGH (ref 0–16)

## 2023-05-05 LAB — PROTIME-INR
INR: 1.2 (ref 0.8–1.2)
Prothrombin Time: 15 s (ref 11.4–15.2)

## 2023-05-05 LAB — MAGNESIUM: Magnesium: 2.4 mg/dL (ref 1.7–2.4)

## 2023-05-05 LAB — HEPATITIS B SURFACE ANTIGEN: Hepatitis B Surface Ag: NONREACTIVE

## 2023-05-05 MED ORDER — LEVETIRACETAM 500 MG PO TABS
500.0000 mg | ORAL_TABLET | ORAL | Status: DC
  Administered 2023-05-06 – 2023-05-16 (×7): 500 mg via ORAL
  Filled 2023-05-05 (×8): qty 1

## 2023-05-05 MED ORDER — SODIUM CHLORIDE 0.9% FLUSH: 3.0000 mL | INTRAVENOUS | Status: DC | PRN

## 2023-05-05 MED ORDER — CALCITRIOL 0.5 MCG PO CAPS
2.2500 ug | ORAL_CAPSULE | ORAL | Status: DC
  Administered 2023-05-05: 2.25 ug via ORAL
  Filled 2023-05-05: qty 1

## 2023-05-05 MED ORDER — OXYCODONE HCL 5 MG PO TABS
5.0000 mg | ORAL_TABLET | ORAL | Status: DC | PRN
  Administered 2023-05-10 – 2023-05-14 (×6): 5 mg via ORAL
  Filled 2023-05-05 (×7): qty 1

## 2023-05-05 MED ORDER — LIDOCAINE-PRILOCAINE 2.5-2.5 % EX CREA
1.0000 | TOPICAL_CREAM | CUTANEOUS | Status: DC
  Filled 2023-05-05: qty 5

## 2023-05-05 MED ORDER — HYDROMORPHONE HCL 1 MG/ML IJ SOLN
0.5000 mg | INTRAMUSCULAR | Status: DC | PRN
  Administered 2023-05-06 – 2023-05-14 (×13): 1 mg via INTRAVENOUS
  Filled 2023-05-05 (×14): qty 1

## 2023-05-05 MED ORDER — IPRATROPIUM BROMIDE 0.02 % IN SOLN: 0.5000 mg | Freq: Four times a day (QID) | RESPIRATORY_TRACT | Status: DC | PRN

## 2023-05-05 MED ORDER — ONDANSETRON HCL 4 MG PO TABS
4.0000 mg | ORAL_TABLET | Freq: Four times a day (QID) | ORAL | Status: DC | PRN
  Administered 2023-05-12: 4 mg via ORAL
  Filled 2023-05-05: qty 1

## 2023-05-05 MED ORDER — HYDROCORTISONE (PERIANAL) 2.5 % EX CREA
1.0000 | TOPICAL_CREAM | Freq: Two times a day (BID) | CUTANEOUS | Status: DC
  Administered 2023-05-06 – 2023-05-17 (×16): 1 via RECTAL
  Filled 2023-05-05 (×2): qty 28.35

## 2023-05-05 MED ORDER — DORZOLAMIDE HCL-TIMOLOL MAL 2-0.5 % OP SOLN
1.0000 [drp] | Freq: Two times a day (BID) | OPHTHALMIC | Status: DC
  Administered 2023-05-06 – 2023-05-17 (×21): 1 [drp] via OPHTHALMIC
  Filled 2023-05-05 (×2): qty 10

## 2023-05-05 MED ORDER — ACETAMINOPHEN 650 MG RE SUPP: 650.0000 mg | Freq: Four times a day (QID) | RECTAL | Status: DC | PRN

## 2023-05-05 MED ORDER — SODIUM CHLORIDE 0.9% IV SOLUTION: Freq: Once | INTRAVENOUS | Status: AC

## 2023-05-05 MED ORDER — ZOLPIDEM TARTRATE 5 MG PO TABS
5.0000 mg | ORAL_TABLET | Freq: Every evening | ORAL | Status: DC | PRN
  Administered 2023-05-07 – 2023-05-11 (×3): 5 mg via ORAL
  Filled 2023-05-05 (×4): qty 1

## 2023-05-05 MED ORDER — ONDANSETRON HCL 4 MG/2ML IJ SOLN: 4.0000 mg | Freq: Four times a day (QID) | INTRAMUSCULAR | Status: DC | PRN

## 2023-05-05 MED ORDER — INSULIN ASPART 100 UNIT/ML IJ SOLN: 0.0000 [IU] | Freq: Three times a day (TID) | INTRAMUSCULAR | Status: DC

## 2023-05-05 MED ORDER — ATORVASTATIN CALCIUM 80 MG PO TABS
80.0000 mg | ORAL_TABLET | Freq: Every day | ORAL | Status: DC
Start: 2023-05-05 — End: 2023-05-18
  Administered 2023-05-05 – 2023-05-16 (×12): 80 mg via ORAL
  Filled 2023-05-05 (×12): qty 1

## 2023-05-05 MED ORDER — LIDOCAINE HCL 1 % IJ SOLN
INTRAMUSCULAR | Status: AC
  Filled 2023-05-05: qty 20

## 2023-05-05 MED ORDER — SODIUM CHLORIDE 0.9% FLUSH: 3.0000 mL | Freq: Two times a day (BID) | INTRAVENOUS | Status: DC

## 2023-05-05 MED ORDER — CINACALCET HCL 30 MG PO TABS
60.0000 mg | ORAL_TABLET | ORAL | Status: DC
  Administered 2023-05-05 – 2023-05-14 (×4): 60 mg via ORAL
  Filled 2023-05-05 (×7): qty 2

## 2023-05-05 MED ORDER — SODIUM ZIRCONIUM CYCLOSILICATE 5 G PO PACK
10.0000 g | PACK | ORAL | Status: AC
  Administered 2023-05-05: 10 g via ORAL
  Filled 2023-05-05: qty 2

## 2023-05-05 MED ORDER — CHLORHEXIDINE GLUCONATE CLOTH 2 % EX PADS: 6.0000 | MEDICATED_PAD | Freq: Every day | CUTANEOUS | Status: DC

## 2023-05-05 MED ORDER — LEVETIRACETAM IN NACL 1500 MG/100ML IV SOLN
1500.0000 mg | Freq: Once | INTRAVENOUS | Status: AC
  Administered 2023-05-05: 1500 mg via INTRAVENOUS
  Filled 2023-05-05: qty 100

## 2023-05-05 MED ORDER — CARVEDILOL 12.5 MG PO TABS
12.5000 mg | ORAL_TABLET | Freq: Two times a day (BID) | ORAL | Status: DC
Start: 2023-05-05 — End: 2023-05-18
  Administered 2023-05-05 – 2023-05-17 (×23): 12.5 mg via ORAL
  Filled 2023-05-05 (×24): qty 1

## 2023-05-05 MED ORDER — LEVALBUTEROL HCL 0.63 MG/3ML IN NEBU
0.6300 mg | INHALATION_SOLUTION | Freq: Four times a day (QID) | RESPIRATORY_TRACT | Status: DC | PRN
  Administered 2023-05-10: 0.63 mg via RESPIRATORY_TRACT
  Filled 2023-05-05: qty 3

## 2023-05-05 MED ORDER — CALCIUM ACETATE (PHOS BINDER) 667 MG PO CAPS
1334.0000 mg | ORAL_CAPSULE | Freq: Three times a day (TID) | ORAL | Status: DC
  Administered 2023-05-05 – 2023-05-12 (×12): 1334 mg via ORAL
  Filled 2023-05-05 (×13): qty 2

## 2023-05-05 MED ORDER — SODIUM ZIRCONIUM CYCLOSILICATE 10 G PO PACK
10.0000 g | PACK | Freq: Once | ORAL | Status: AC
  Administered 2023-05-05: 10 g via ORAL
  Filled 2023-05-05: qty 1

## 2023-05-05 MED ORDER — HEPARIN SODIUM (PORCINE) 5000 UNIT/ML IJ SOLN
5000.0000 [IU] | Freq: Three times a day (TID) | INTRAMUSCULAR | Status: DC
  Administered 2023-05-05 – 2023-05-17 (×27): 5000 [IU] via SUBCUTANEOUS
  Filled 2023-05-05 (×29): qty 1

## 2023-05-05 MED ORDER — LIDOCAINE HCL (PF) 1 % IJ SOLN
30.0000 mL | Freq: Once | INTRAMUSCULAR | Status: AC
  Administered 2023-05-05: 10 mL
  Filled 2023-05-05: qty 30

## 2023-05-05 MED ORDER — BISACODYL 5 MG PO TBEC: 5.0000 mg | DELAYED_RELEASE_TABLET | Freq: Every day | ORAL | Status: DC | PRN

## 2023-05-05 MED ORDER — FLEET ENEMA RE ENEM: 1.0000 | ENEMA | Freq: Once | RECTAL | Status: DC | PRN

## 2023-05-05 MED ORDER — SENNOSIDES-DOCUSATE SODIUM 8.6-50 MG PO TABS: 1.0000 | ORAL_TABLET | Freq: Every evening | ORAL | Status: DC | PRN

## 2023-05-05 MED ORDER — ASPIRIN 81 MG PO TBEC
81.0000 mg | DELAYED_RELEASE_TABLET | Freq: Every day | ORAL | Status: DC
  Administered 2023-05-05 – 2023-05-17 (×13): 81 mg via ORAL
  Filled 2023-05-05 (×13): qty 1

## 2023-05-05 MED ORDER — LEVETIRACETAM 500 MG PO TABS
750.0000 mg | ORAL_TABLET | ORAL | Status: DC
  Administered 2023-05-05 – 2023-05-14 (×4): 750 mg via ORAL
  Filled 2023-05-05 (×7): qty 1

## 2023-05-05 MED ORDER — SODIUM CHLORIDE 0.9% FLUSH
3.0000 mL | Freq: Two times a day (BID) | INTRAVENOUS | Status: DC
  Administered 2023-05-05 – 2023-05-17 (×25): 3 mL via INTRAVENOUS

## 2023-05-05 MED ORDER — HYDRALAZINE HCL 20 MG/ML IJ SOLN
10.0000 mg | INTRAMUSCULAR | Status: DC | PRN
  Administered 2023-05-05: 10 mg via INTRAVENOUS
  Filled 2023-05-05: qty 1

## 2023-05-05 MED ORDER — SODIUM CHLORIDE 0.9 % IV SOLN
3.0000 g | INTRAVENOUS | Status: AC
  Administered 2023-05-05 – 2023-05-09 (×5): 3 g via INTRAVENOUS
  Filled 2023-05-05 (×5): qty 8

## 2023-05-05 MED ORDER — AMIODARONE HCL 200 MG PO TABS
100.0000 mg | ORAL_TABLET | ORAL | Status: DC
  Administered 2023-05-06 – 2023-05-17 (×12): 100 mg via ORAL
  Filled 2023-05-05 (×12): qty 1

## 2023-05-05 MED ORDER — ACETAMINOPHEN 325 MG PO TABS: 650.0000 mg | ORAL_TABLET | Freq: Four times a day (QID) | ORAL | Status: DC | PRN

## 2023-05-05 MED ORDER — RENA-VITE PO TABS
1.0000 | ORAL_TABLET | Freq: Every day | ORAL | Status: DC
  Administered 2023-05-05 – 2023-05-16 (×12): 1 via ORAL
  Filled 2023-05-05 (×12): qty 1

## 2023-05-05 MED ORDER — DOCUSATE SODIUM 100 MG PO CAPS
100.0000 mg | ORAL_CAPSULE | Freq: Two times a day (BID) | ORAL | Status: DC
  Administered 2023-05-06 – 2023-05-07 (×3): 100 mg via ORAL
  Filled 2023-05-05 (×7): qty 1

## 2023-05-05 NOTE — Assessment & Plan Note (Signed)
 Of hypertension, with hypertensive episodes -Home medication reviewed, amiodarone reduced from 200 mg to 100 mg p.o. daily, Coreg 12.5 mg p.o. twice daily,  -Midodrine 5 mg p.o. 3 times daily-on hold as patient is hypertensive now

## 2023-05-05 NOTE — Assessment & Plan Note (Signed)
-   Asymptomatic, with mild shortness of breath, -Denies any chest pain -Large right-sided pleural effusion -Plan for thoracentesis, IR consulted, -Also [name]-discussed and consulted Dr. Imogene Burn pulmonary critical care  To evaluate and pursue with thoracentesis -as this procedure may be complicated due to large volume.

## 2023-05-05 NOTE — Assessment & Plan Note (Signed)
-   On hemodialysis TTS -Not sure if patient completed hemodialysis today - -Dr. Kathe Mariner nephrologist consulted: Recommended-10 mg p.o. x 1 for hyperkalemia, and 1U PRBC blood transfusion for anemia He will follow-up with [facility]-for possible further hemodialysis

## 2023-05-05 NOTE — Assessment & Plan Note (Signed)
Continue statin Lipitor ?

## 2023-05-05 NOTE — Assessment & Plan Note (Signed)
-   Stable denies any chest pain -Continue current home medication, beta-blocker, statins, aspirin

## 2023-05-05 NOTE — Procedures (Signed)
 PROCEDURE SUMMARY:  Successful image-guided right thoracentesis. Yielded 1.5 L of hazy amber fluid. Pt tolerated procedure well. No immediate complications. EBL = trace   Specimen was  sent for labs. CXR ordered.  Please see imaging section of Epic for full dictation.  Lynann Bologna Linnette Panella PA-C 05/05/2023 2:56 PM

## 2023-05-05 NOTE — H&P (Signed)
 History and Physical   Patient: [name].                            PCP: [name], FNP                    DOB: 1970-12-23            DOA: 05/05/2023 AOZ:[id number]             DOS: 05/05/2023, 11:14 AM  [name], FNP  Patient coming from:   HOME  I have personally reviewed patient's medical records, in electronic medical records, including:  [place] link, and care everywhere.    Chief Complaint:   Chief Complaint  Patient presents with   Seizures   Sore    History of present illness:    [name]. Is a 53 year old male from [place] [facility] with extensive history of ESRD (on HD - TTS), right BKA, CVA, A-fib -not any anticoagulant, DM2, seizures on Keppra-presented to the ED with seizure-like activity during hemodialysis today.  Patient reports he has been compliant with his medication,, was complaining of some shortness of breath for past 2 days when laying down, but did not have any chest pain.  Had some mild nonproductive cough.  Recent had a below amputation by [name].  Patient and wife concerned if the stump may be infected-also has been advised in the past stump may be revised. Denies of having for fever chills, with no report of extensive drainage from the stump.   ED course/evaluation; Blood pressure (!) 164/88, pulse 63, temperature 98 F (36.7 C), temperature source Oral, height 6\' 3"  (1.905 m), weight 79.8 kg, SpO2 94%.  Labs: CBC: Hemoglobin 7.1, hematocrit 25.0,, CMP; sodium 137, potassium 5.4, BUN 53, creatinine 8.93, calcium 9.1, magnesium 2.4, albumin 2.3, AST 13, GFR 6,  Viral panel-all negative Blood cultures obtained  Right knee x-ray: *Status post right above-knee amputation. No focal bony erosions. Thinning of the overlying stump with probable small area of focal ulcer.   [name]: Dr. Rolly Pancake discussed with Dr. Due to with a complicated right knee amputation for reevaluation and revision-requested patient to be  admitted to [facility] and he will follow. [name] also discussed PCCM Dr. Imogene Burn patient has a large right pleural effusion that needed thoracentesis-I risk he has agreed to evaluate the patient at: 4 thoracentesis  Patient will be admitted to [facility] for [name] evaluation, PCCM Dr. Imogene Burn evaluation.   Consults nephrology IR/nephrology/Ortho/PCCM /neurology [name], Dr. Due to, Dr. Johny Drilling, Dr. Melynda Ripple     Patient Denies having: Fever, Chills, Cough, SOB, Chest Pain, Abd pain, N/V/D, headache, dizziness, lightheadedness,  Dysuria, Joint pain, rash, open wounds   Review of Systems: As per HPI, otherwise 10 point review of systems were negative.   ----------------------------------------------------------------------------------------------------------------------  Allergies  Allergen Reactions   Promethazine Diarrhea and Other (See Comments)    Muscle cramps, cramping "all over"    Home MEDs:  Prior to Admission medications   Medication Sig Start Date End Date Taking? Authorizing Provider  acetaminophen (TYLENOL) 325 MG tablet Take 1-2 tablets (325-650 mg total) by mouth every 4 (four) hours as needed for mild pain. 05/04/21   Love, Evlyn Kanner, PA-C  amiodarone (PACERONE) 200 MG tablet Take 1 tablet (200 mg total) by mouth every morning. 08/02/22 08/03/23  Corky Crafts, MD  aspirin EC 81 MG tablet Take 1 tablet (81  mg total) by mouth daily. Swallow whole. 04/12/23   Rai, Delene Ruffini, MD  atorvastatin (LIPITOR) 80 MG tablet TAKE ONE TABLET BY MOUTH AT BEDTIME. 03/22/23   Gaston Islam., NP  calcitRIOL (ROCALTROL) 0.25 MCG capsule Take 9 capsules (2.25 mcg total) by mouth every Tuesday, Thursday, and Saturday at 6 PM. 02/24/23   Danford, Earl Lites, MD  calcium acetate (PHOSLO) 667 MG capsule Take 2 capsules (1,334 mg total) by mouth 3 (three) times daily with meals. 05/04/21   Love, Evlyn Kanner, PA-C  carvedilol (COREG) 12.5 MG tablet Take 1 tablet (12.5 mg total) by mouth 2 (two) times daily.  02/23/23   Alberteen Sam, MD  cinacalcet (SENSIPAR) 30 MG tablet Take 2 tablets (60 mg total) by mouth every Tuesday, Thursday, and Saturday at 6 PM. 02/24/23   Danford, Earl Lites, MD  docusate sodium (COLACE) 100 MG capsule Take 1 capsule (100 mg total) by mouth 2 (two) times daily. 04/12/23 04/11/24  Rai, Ripudeep K, MD  dorzolamide-timolol (COSOPT) 2-0.5 % ophthalmic solution Place 1 drop into the right eye 2 (two) times daily. 11/15/22 11/15/23  [provider]  hydrocortisone (ANUSOL-HC) 2.5 % rectal cream Place 1 Application rectally 2 (two) times daily. 10/05/22   Leath-Warren, Sadie Haber, NP  levETIRAcetam (KEPPRA) 500 MG tablet Take 1 tablet (500 mg total) by mouth 4 (four) times a week. On Sunday, Monday, Wednesday and Friday, all non-dialysis days 02/25/23   Alberteen Sam, MD  levETIRAcetam (KEPPRA) 750 MG tablet Take 1 tablet (750 mg total) by mouth 3 (three) times a week. On Tuesday Thursday and Saturday, on dialysis days 02/24/23   Alberteen Sam, MD  lidocaine-prilocaine (EMLA) cream Apply 1 Application topically 3 (three) times a week. 12/24/21   [provider]  loperamide (IMODIUM) 2 MG capsule Take 1 capsule (2 mg total) by mouth as needed for diarrhea or loose stools. 02/23/23   Danford, Earl Lites, MD  midodrine (PROAMATINE) 5 MG tablet Take 1 tablet (5 mg total) by mouth 3 (three) times daily with meals. 02/23/23   Danford, Earl Lites, MD  multivitamin (RENA-VIT) TABS tablet Take 1 tablet by mouth at bedtime. 04/12/23   Rai, Delene Ruffini, MD  nutrition supplement, JUVEN, (JUVEN) PACK Take 1 packet by mouth 2 (two) times daily between meals. 02/23/23   Danford, Earl Lites, MD  Nutritional Supplements (,FEEDING SUPPLEMENT, PROSOURCE PLUS) liquid Take 30 mLs by mouth 3 (three) times daily between meals. 02/23/23   Danford, Earl Lites, MD  ondansetron (ZOFRAN) 4 MG tablet Take 1 tablet (4 mg total) by mouth every 6 (six) hours as needed for nausea or  vomiting. 03/17/23   Al Decant, PA-C  polyethylene glycol (MIRALAX / GLYCOLAX) 17 g packet Take 17 g by mouth daily. 04/12/23   Rai, Delene Ruffini, MD    PRN MEDs: acetaminophen **OR** acetaminophen, bisacodyl, hydrALAZINE, HYDROmorphone (DILAUDID) injection, ipratropium, levalbuterol, ondansetron **OR** ondansetron (ZOFRAN) IV, oxyCODONE, senna-docusate, sodium chloride flush, sodium phosphate, zolpidem  Past Medical History:  Diagnosis Date   Acute Bil  CVAs of Bil Frontal amd Rt Parietal  in Watershed Distribution 04/21/2021   Patchy acute cortically-based infarcts within the bilateral high  frontal lobes and right parietal lobe (predominantly in a watershed  distribution).   Subcentimeter acute infarct within the callosal splenium on the  left.     Acute bilateral cerebral infarction in a watershed distribution [facility]) 04/21/2021   a.) MRI brain 04/21/2021 --> patchy ACUTE cortically-based infarcts within the  bilateral high frontal lobes and right parietal lobe   Acute cerebral infarction (HCC) 02/13/2021   a.) MRI brain 02/13/2021: acute LEFT hippocampal infarct   Acute cerebral infarction (HCC) 04/21/2021   a.) MRI brain 04/21/2021: subcentimeter ACUTE infarct within the callosal splenium on the LEFT   Acute osteomyelitis of toe of right foot with gangrene (HCC) 02/26/2022   Anaphylactic reaction due to adverse effect of correct drug or medicament properly administered, initial encounter 05/08/2019   Anemia of chronic renal failure    Anxiety    Aortic dilatation (HCC) 11/25/2020   a.) TTE 11/25/2020: Ao root measured 38 mm. b.) TTE 04/22/2021: Ao root measured 44 mm; c. 11/2021 Echo: Ao root 40mm.   Atrial fibrillation (HCC)    a.) CHA2DS2-VASc = 6 (CHF, HTN, CVA x2, prior MI, T2DM). b.) rate/rhythm maintained on oral amiodarone + carvedilol; chronic OAC/AP therapy discontinued following ICH   Benign prostatic hyperplasia with lower urinary tract symptoms 11/07/2019   BPH (benign  prostatic hyperplasia)    Cardiac arrest (HCC) 07/26/2021   a.) in setting of hyperkalemia, NSTEMI, and seizure following missed HD; pulseless and apneic --> required 1 round of CPR prior to ROSC   Cellulitis    Cerebral hemorrhage (HCC) 11/21/2021   a.) CT head 11/21/2021 --> 1.4 x 1.5 x 3.1 cm ACUTE hemorrhage within the inferior aspect of the fourth ventricle, and extending inferiorly through the foramen of foramen of [place] --> occurred in setting of HTN emergency and DOAC/APT-->eliquis/plavix d/c'd.   Cerebral microvascular disease    Chronic diarrhea    Chronic heart failure with preserved ejection fraction (HFpEF) (HCC) 07/19/2017   a.) 06/2017 Echo: EF 75%; b.) 11/2020 Echo: EF 55-60%; c.) 04/2021 Echo: EF 55-60%; d.) 11/2021 Echo: EF 65-70%, GrII DD, nl RV fxn, sev dil LA, large circumferential pericardial eff w/o tamponade, triv MR, AoV sclerosis. Ao root 40mm; e.) TTE 02/28/2025: EF 60-65%, sev LVH, mod posterior LV effusion, AoV sclerosis   Chronic pain of both ankles    Coronary artery disease 02/26/2020   a. 02/2020 Ant STEMI/PCI: LM nl, LAD 56m (3.5x30 Resolute Onyx), RI nl, LCX min irregs, RCA min irregs. EF 65%.   DDD (degenerative disc disease), cervical    Diabetes mellitus, type II (HCC)    Diabetic neuropathy (HCC)    Esophagitis    ESRD (end stage renal disease) on dialysis (HCC)    a.) [facility]; T-Th-Sat   Frequent falls    Gait instability    Gastritis    Gastroparesis    GERD (gastroesophageal reflux disease)    H/O enucleation of left eyeball    Heart palpitations    History of kidney stones    HLD (hyperlipidemia)    Hypertension    Insomnia    a.) uses melatonin PRN   Long term current use of amiodarone    Nausea and vomiting in adult    recurrent   NSTEMI (non-ST elevated myocardial infarction) (HCC) 07/26/2021   a.) hyperkalemic from missed HD; seizures; decompensated to cardiac arrest and required CPR x 1 round prior to ROSC.   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  Added automatically from request for surgery [id number]     Osteomyelitis [facility])    Pericardial effusion    a. 11/2021 Echo: EF 65-70%, GrII DD, nl RV fxn, sev dil LA, large circumferential pericardial eff w/o tamponade, triv MR, AoV sclerosis. Ao root 40mm.  Seizure (HCC)    a.) last 07/26/2021 in setting of missed HD --> hyperkalemic at 6.5 --> pulseless/apneic and required CPR; discharged home on levetiracetam.   ST elevation myocardial infarction (STEMI) of anterior wall (HCC) 02/26/2020   a.) LHC/PCI 02/26/2020 --> EF 65%; LVEDP 11 mmHg; 90% mLAD (3.5 x 20 mm Resolute Onyx DES x 1)    Past Surgical History:  Procedure Laterality Date   A/V FISTULAGRAM Left 08/31/2021   Procedure: A/V Fistulagram;  Surgeon: Annice Needy, MD;  Location: [name] INVASIVE CV LAB;  Service: Cardiovascular;  Laterality: Left;   A/V FISTULAGRAM Left 01/25/2022   Procedure: A/V Fistulagram;  Surgeon: Annice Needy, MD;  Location: [name] INVASIVE CV LAB;  Service: Cardiovascular;  Laterality: Left;   A/V FISTULAGRAM Right 10/18/2022   Procedure: A/V Fistulagram;  Surgeon: Annice Needy, MD;  Location: [name] INVASIVE CV LAB;  Service: Cardiovascular;  Laterality: Right;   ABDOMINAL AORTOGRAM W/LOWER EXTREMITY Bilateral 02/08/2023   Procedure: ABDOMINAL AORTOGRAM W/LOWER EXTREMITY;  Surgeon: Daria Pastures, MD;  Location: [facility] INVASIVE CV LAB;  Service: Vascular;  Laterality: Bilateral;   AMPUTATION Left 03/16/2016   Procedure: AMPUTATION DIGIT LEFT HALLUX;  Surgeon: Vivi Barrack, DPM;  Location: [facility] OR;  Service: Podiatry;  Laterality: Left;  can start around 5    AMPUTATION Right 02/09/2021   Procedure: AMPUTATION DIGIT;  Surgeon: Marlyne Beards, MD;  Location: [facility] OR;  Service: Orthopedics;  Laterality: Right;   AMPUTATION Right 02/04/2023   Procedure: AMPUTATION 4TH AND 5TH METATARSAL;  Surgeon:  Pilar Plate, DPM;  Location: [facility] OR;  Service: Orthopedics/Podiatry;  Laterality: Right;  I&D, partial ray resection, abx beads   AMPUTATION Right 02/09/2023   Procedure: RIGHT BELOW KNEE AMPUTATION;  Surgeon: [name], MD;  Location: [facility] OR;  Service: Orthopedics;  Laterality: Right;   AMPUTATION Right 04/06/2023   Procedure: RIGHT ABOVE KNEE AMPUTATION;  Surgeon: [name], MD;  Location: [facility] OR;  Service: Orthopedics;  Laterality: Right;   AMPUTATION TOE Right 02/28/2022   Procedure: AMPUTATION TOE;  Surgeon: Louann Sjogren, DPM;  Location: [name] ORS;  Service: Podiatry;  Laterality: Right;  right fifth toe   ARTHROSCOPIC REPAIR ACL Left    AV FISTULA PLACEMENT Right 05/31/2019   Procedure: Brachiocephalic AV fistula creation;  Surgeon: Annice Needy, MD;  Location: [name] ORS;  Service: Vascular;  Laterality: Right;   AV FISTULA PLACEMENT Left 08/13/2021   Procedure: ARTERIOVENOUS (AV) FISTULA CREATION (BRACHIALCEPHALIC);  Surgeon: Annice Needy, MD;  Location: [name] ORS;  Service: Vascular;  Laterality: Left;   COLONOSCOPY WITH PROPOFOL N/A 10/28/2015   Procedure: COLONOSCOPY WITH PROPOFOL;  Surgeon: Christena Deem, MD;  Location: [facility] ENDOSCOPY;  Service: Endoscopy;  Laterality: N/A;   COLONOSCOPY WITH PROPOFOL N/A 10/29/2015   Procedure: COLONOSCOPY WITH PROPOFOL;  Surgeon: Christena Deem, MD;  Location: [facility] ENDOSCOPY;  Service: Endoscopy;  Laterality: N/A;   COLONOSCOPY WITH PROPOFOL N/A 01/27/2023   Procedure: COLONOSCOPY WITH PROPOFOL;  Surgeon: Franky Macho, MD;  Location: [facility] ENDO SUITE;  Service: Endoscopy;  Laterality: N/A;  12:30pm, asa 3   CORONARY ULTRASOUND/IVUS N/A 02/26/2020   Procedure: Intravascular Ultrasound/IVUS;  Surgeon: Corky Crafts, MD;  Location: [facility] INVASIVE CV LAB;  Service: Cardiovascular;  Laterality: N/A;   CORONARY/GRAFT ACUTE MI REVASCULARIZATION N/A 02/26/2020   Procedure: Coronary/Graft Acute MI Revascularization;  Surgeon:  Corky Crafts, MD;  Location: [facility] INVASIVE CV LAB;  Service: Cardiovascular;  Laterality: N/A;   DIALYSIS/PERMA CATHETER INSERTION  Right 04/26/2019   Perm Cath    DIALYSIS/PERMA CATHETER INSERTION N/A 04/26/2019   Procedure: DIALYSIS/PERMA CATHETER INSERTION;  Surgeon: Annice Needy, MD;  Location: [name] INVASIVE CV LAB;  Service: Cardiovascular;  Laterality: N/A;   DIALYSIS/PERMA CATHETER INSERTION N/A 05/25/2021   Procedure: DIALYSIS/PERMA CATHETER INSERTION;  Surgeon: Annice Needy, MD;  Location: [name] INVASIVE CV LAB;  Service: Cardiovascular;  Laterality: N/A;   DIALYSIS/PERMA CATHETER INSERTION N/A 08/31/2021   Procedure: DIALYSIS/PERMA CATHETER INSERTION;  Surgeon: Annice Needy, MD;  Location: [name] INVASIVE CV LAB;  Service: Cardiovascular;  Laterality: N/A;   DIALYSIS/PERMA CATHETER REMOVAL N/A 08/15/2019   Procedure: DIALYSIS/PERMA CATHETER REMOVAL;  Surgeon: Renford Dills, MD;  Location: [name] INVASIVE CV LAB;  Service: Cardiovascular;  Laterality: N/A;   DIALYSIS/PERMA CATHETER REMOVAL N/A 03/15/2022   Procedure: DIALYSIS/PERMA CATHETER REMOVAL;  Surgeon: Annice Needy, MD;  Location: [name] INVASIVE CV LAB;  Service: Cardiovascular;  Laterality: N/A;   EMBOLIZATION (CATH LAB) Right 02/06/2021   Procedure: EMBOLIZATION;  Surgeon: Annice Needy, MD;  Location: [name] INVASIVE CV LAB;  Service: Cardiovascular;  Laterality: Right;  Right Upper Extremity Dialysis Access, Permcath Placement   ESOPHAGOGASTRODUODENOSCOPY (EGD) WITH PROPOFOL N/A 12/27/2017   Procedure: ESOPHAGOGASTRODUODENOSCOPY (EGD) WITH PROPOFOL;  Surgeon: Toledo, Boykin Nearing, MD;  Location: [name] ENDOSCOPY;  Service: Gastroenterology;  Laterality: N/A;   EYE SURGERY     LEFT HEART CATH AND CORONARY ANGIOGRAPHY N/A 02/26/2020   Procedure: LEFT HEART CATH AND CORONARY ANGIOGRAPHY;  Surgeon: Corky Crafts, MD;  Location: [facility] INVASIVE CV LAB;  Service: Cardiovascular;  Laterality: N/A;   PROSTATE SURGERY  2016   REVISON OF  ARTERIOVENOUS FISTULA Right 07/22/2022   Procedure: RESECTION OF ANEURYSMAL RIGHT ARM ARTERIOVENOUS FISTULA;  Surgeon: Annice Needy, MD;  Location: [name] ORS;  Service: Vascular;  Laterality: Right;   TONSILECTOMY/ADENOIDECTOMY WITH MYRINGOTOMY     TONSILLECTOMY     UPPER EXTREMITY ANGIOGRAPHY Right 11/26/2020   Procedure: Upper Extremity Angiography;  Surgeon: Annice Needy, MD;  Location: [name] INVASIVE CV LAB;  Service: Cardiovascular;  Laterality: Right;   UPPER EXTREMITY ANGIOGRAPHY Right 02/05/2021   Procedure: UPPER EXTREMITY ANGIOGRAPHY;  Surgeon: Annice Needy, MD;  Location: [name] INVASIVE CV LAB;  Service: Cardiovascular;  Laterality: Right;     reports that he has never smoked. He has never used smokeless tobacco. He reports that he does not drink alcohol and does not use drugs.   Family History  Problem Relation Age of Onset   CAD Father    Stroke Father    Diabetes Mellitus II Mother    Kidney failure Mother    Schizophrenia Mother     Physical Exam:   Vitals:   05/05/23 0727 05/05/23 0729  BP: (!) 164/88   Pulse: 63   Temp: 98 F (36.7 C)   TempSrc: Oral   SpO2: 94%   Weight:  79.8 kg  Height:  6\' 3"  (1.905 m)   Constitutional: NAD, calm, comfortable Eyes: PERRL, lids and conjunctivae normal ENMT: Left eye blindness, with static eye in place,  mucous membranes are moist. Posterior pharynx clear of any exudate or lesions.Normal dentition.  Neck: normal, supple, no masses, no thyromegaly Respiratory: clear to auscultation bilaterally, no wheezing, no crackles. Normal respiratory effort. No accessory muscle use.  Cardiovascular: Regular rate and rhythm, no murmurs / rubs / gallops. No extremity edema. 2+ pedal pulses. No carotid bruits.  Abdomen: no tenderness, no masses palpated. No hepatosplenomegaly. Bowel sounds positive.  Musculoskeletal: Right  BKA, with open wound, no significant drainage no clubbing / cyanosis. No joint deformity upper and lower extremities.  Good ROM, no contractures. Normal muscle tone.  Neurologic: CN II-XII grossly intact. Sensation intact, DTR normal. Strength 5/5 in all 4.  Psychiatric: Normal judgment and insight. Alert and oriented x 3. Normal mood.  Skin: no rashes, lesions, ulcers. No induration Decubitus/ulcers: Please see note below Wounds: Please see note below  Pressure Injury 02/10/23 Sacrum Right;Upper;Mid Stage 2 -  Partial thickness loss of dermis presenting as a shallow open injury with a red, pink wound bed without slough. (Active)  02/10/23   Location: Sacrum  Location Orientation: Right;Upper;Mid  Staging: Stage 2 -  Partial thickness loss of dermis presenting as a shallow open injury with a red, pink wound bed without slough.  Wound Description (Comments):   Present on Admission:   Dressing Type Foam - Lift dressing to assess site every shift 04/13/23 1150     Pressure Injury 03/25/23 Other (Comment) Right Unstageable - Full thickness tissue loss in which the base of the injury is covered by slough (yellow, tan, gray, green or brown) and/or eschar (tan, brown or black) in the wound bed. (Active)  03/25/23 1200  Location: Other (Comment) (stump)  Location Orientation: Right  Staging: Unstageable - Full thickness tissue loss in which the base of the injury is covered by slough (yellow, tan, gray, green or brown) and/or eschar (tan, brown or black) in the wound bed.  Wound Description (Comments):   Present on Admission: Yes     Pressure Injury 04/13/23 Heel Left Unstageable - Full thickness tissue loss in which the base of the injury is covered by slough (yellow, tan, gray, green or brown) and/or eschar (tan, brown or black) in the wound bed. (Active)  04/13/23 1148  Location: Heel  Location Orientation: Left  Staging: Unstageable - Full thickness tissue loss in which the base of the injury is covered by slough (yellow, tan, gray, green or brown) and/or eschar (tan, brown or black) in the wound bed.  Wound  Description (Comments):   Present on Admission: Yes  Dressing Type Gauze (Comment) 04/13/23 1150       Labs on admission:    I have personally reviewed following labs and imaging studies  CBC: Recent Labs  Lab 05/05/23 0804  WBC 8.1  NEUTROABS 5.3  HGB 7.1*  HCT 25.0*  MCV 71.8*  PLT 249   Basic Metabolic Panel: Recent Labs  Lab 05/05/23 0804  NA 137  K 5.4*  CL 103  CO2 20*  GLUCOSE 92  BUN 53*  CREATININE 8.93*  CALCIUM 9.1  MG 2.4  PHOS 6.8*   GFR: Estimated Creatinine Clearance: 10.5 mL/min (A) (by C-G formula based on SCr of 8.93 mg/dL (H)). Liver Function Tests: Recent Labs  Lab 05/05/23 0804  AST 13*  ALT 10  ALKPHOS 58  BILITOT 0.6  PROT 6.5  ALBUMIN 2.3*    Urine analysis:    Component Value Date/Time   COLORURINE YELLOW (A) 11/07/2017 1855   APPEARANCEUR CLOUDY (A) 11/07/2017 1855   LABSPEC 1.016 11/07/2017 1855   PHURINE 5.0 11/07/2017 1855   GLUCOSEU >=500 (A) 11/07/2017 1855   HGBUR MODERATE (A) 11/07/2017 1855   BILIRUBINUR NEGATIVE 11/07/2017 1855   KETONESUR NEGATIVE 11/07/2017 1855   PROTEINUR >=300 (A) 11/07/2017 1855   NITRITE NEGATIVE 11/07/2017 1855   LEUKOCYTESUR NEGATIVE 11/07/2017 1855    Last A1C:  Lab Results  Component Value Date   HGBA1C 4.5 (  L) 11/21/2021     Radiologic Exams on Admission:   DG Knee 1-2 Views Right Result Date: 05/05/2023 CLINICAL DATA:  [id number] Pain 144615. Soreness in the region of the amputation. EXAM: RIGHT KNEE - 1-2 VIEW COMPARISON:  None Available. FINDINGS: Status post right above-knee amputation. The surgical margin is sharp. No focal bony erosions. There is thinning of the overlying stump especially on the cross-table view with probable small area of focal ulcer. Correlate with physical examination. No evidence of air within the stump. No acute fracture. No radiopaque foreign bodies. IMPRESSION: *Status post right above-knee amputation. No focal bony erosions. Thinning of the overlying  stump with probable small area of focal ulcer. Correlate with physical examination. Electronically Signed   By: Jules Schick M.D.   On: 05/05/2023 10:36   DG Chest Portable 1 View Result Date: 05/05/2023 CLINICAL DATA:  Shortness of breath. EXAM: PORTABLE CHEST 1 VIEW COMPARISON:  10/08/2022. FINDINGS: There is large right pleural effusion causing shift of mediastinal structures to the left. Mild aeration of the right upper lung zone noted. Left lung and left lateral costophrenic angle are clear. Evaluation of cardiomediastinal silhouette is nondiagnostic due to right lower hemithorax opacification. No acute osseous abnormalities. The soft tissues are within normal limits. IMPRESSION: *Large right pleural effusion causing shift of mediastinal structures to the left. Electronically Signed   By: Jules Schick M.D.   On: 05/05/2023 08:14    EKG:   Independently reviewed.  Orders placed or performed during the hospital encounter of 05/05/23   ED EKG   ED EKG   EKG 12-Lead   EKG 12-Lead   EKG 12-Lead   ---------------------------------------------------------------------------------------------------------------------------------------    Assessment / Plan:   Principal Problem:   Pleural effusion Active Problems:   Anemia due to chronic kidney disease, on chronic dialysis (HCC)   History of seizure   S/P BKA (below knee amputation), right (HCC) - 02-09-2023   Chronic Grade 2 diastolic heart failure/EF 55 to 60%   PAD (peripheral artery disease) (HCC)   Pressure injury of skin   Mixed hyperlipidemia   Essential hypertension, benign   Uncontrolled type 2 diabetes mellitus with hyperglycemia, with long-term current use of insulin (HCC)   Blindness of left eye   CAD/ H/o prior STEMI 02/2020 s/p DES to LAD   ESRD on hemodialysis [facility])   Coronary artery disease   H/O enucleation of left eyeball   Assessment and Plan: * Pleural effusion - Asymptomatic, with mild shortness of  breath, -Denies any chest pain -Large right-sided pleural effusion -Plan for thoracentesis, IR consulted, -Also [name]-discussed and consulted Dr. Imogene Burn pulmonary critical care  To evaluate and pursue with thoracentesis -as this procedure may be complicated due to large volume.  S/P BKA (below knee amputation), right [facility]) - 02-09-2023 -Recent BKA of the right lower extremity -Pursuing with imaging, to rule out osteomyelitis -[name] discussed with [name] who will see the patient on arrival to [facility] -Withholding antibiotics as patient is afebrile, no leukocytosis, no signs of infection -X-ray of the right stump did not reveal any signs of osteomyelitis *Status post right above-knee amputation. No focal bony erosions. Thinning of the overlying stump with probable small area of focal ulcer.    Right lower extremity BKA wound:      History of seizure -Possible breakthrough seizures -On home medication of Keppra -Obtaining Keppra level -Continue current regimen of Keppra per med rec  Anemia due to chronic kidney disease, on chronic dialysis (HCC) -Anemia of chronic  disease, CKD, iron deficiency, - with baseline hemoglobin around 8.0  -Hemoglobin down to 7.1 -With nephrology [name] who agreed to proceed with 1U PRBC transfusion today    Latest Ref Rng & Units 05/05/2023    8:04 AM 04/14/2023   11:30 AM 04/13/2023    7:34 AM  CBC  WBC 4.0 - 10.5 K/uL 8.1  11.2  11.8   Hemoglobin 13.0 - 17.0 g/dL 7.1  7.8  8.4   Hematocrit 39.0 - 52.0 % 25.0  25.4  27.3   Platelets 150 - 400 K/uL 249  157  163      Pressure injury of skin Continue wound care per nursing staff  PAD (peripheral artery disease) (HCC) - Aspirin and Lipitor -History of BKA  Chronic Grade 2 diastolic heart failure/EF 55 to 60% - Monitor closely, shortness of breath likely due to right pleural effusion -No signs of volume overload -Continue with hemodialysis, -Currently seems to be euvolemic -Home  medications  H/O enucleation of left eyeball Stable  Coronary artery disease - Stable denies any chest pain -Continue current home medication, beta-blocker, statins, aspirin  ESRD on hemodialysis (HCC) - On hemodialysis TTS -Not sure if patient completed hemodialysis today - -Dr. Kathe Mariner nephrologist consulted: Recommended-10 mg p.o. x 1 for hyperkalemia, and 1U PRBC blood transfusion for anemia He will follow-up with [facility]-for possible further hemodialysis  CAD/ H/o prior STEMI 02/2020 s/p DES to LAD - Stable, not complaining of any chest pain -Home medication including aspirin, Lipitor, Coreg  Blindness of left eye - Chronic, with prosthesis -Stable, monitoring  Uncontrolled type 2 diabetes mellitus with hyperglycemia, with long-term current use of insulin (HCC) -Currently on any medication at home (will reevaluate med rec) -Will initiate CBG q. ACHS, with Isai coverage -Check A1c last A1c 4.5    Essential hypertension, benign Of hypertension, with hypertensive episodes -Home medication reviewed, amiodarone reduced from 200 mg to 100 mg p.o. daily, Coreg 12.5 mg p.o. twice daily,  -Midodrine 5 mg p.o. 3 times daily-on hold as patient is hypertensive now  Mixed hyperlipidemia Continue statin-Lipitor  Hypertension secondary to other renal disorders-resolved as of 05/05/2023 Stable continue current meds: Amiodarone, Coreg, -      Consults called:  Consults nephrology IR/nephrology/Ortho/PCCM /neurology [name], Dr. Due to, Dr. Johny Drilling, Dr. Melynda Ripple -------------------------------------------------------------------------------------------------------------------------------------------- DVT prophylaxis:  heparin injection 5,000 Units Start: 05/05/23 2200 TED hose Start: 05/05/23 1023 SCDs Start: 05/05/23 1023   Code Status:   Code Status: Full Code   Admission status: Patient will be admitted as Inpatient, with a greater than 2 midnight length of stay. Level of care:  Telemetry Medical   Family Communication:  none at bedside  (The above findings and plan of care has been discussed with patient in detail, the patient expressed understanding and agreement of above plan)  --------------------------------------------------------------------------------------------------------------------------------------------------  Disposition Plan: >3 days Status is: Inpatient Remains inpatient appropriate because: Needing multidisciplinary consultation, Including hemodialysis, blood transfusion,, correcting hyperkalemia,-needing evaluation by orthopedic physician [name] for possible revision of right BKA     ----------------------------------------------------------------------------------------------------------------------------------------------------  Critical care time spent:  48  Min.  Was spent seeing and evaluating the patient, reviewing all medical records, drawn plan of care.  SIGNED: Kendell Bane, MD, FHM. FAAFP. [place] [facility], Pager  (Please use [email or web address] to page/ or secure chat through epic) If 7PM-7AM, please contact night-coverage [email or web address],  05/05/2023, 11:14 AM

## 2023-05-05 NOTE — Assessment & Plan Note (Signed)
 Stable

## 2023-05-05 NOTE — ED Triage Notes (Signed)
 Patient who lives in [place] was brought from dialysis by [facility] for 2 seizures. Patient has had shortness of breath for several days when laying down, bed sores, and sores on his amputated leg.

## 2023-05-05 NOTE — ED Notes (Signed)
[facility] called to transport patient. Nurse notified 

## 2023-05-05 NOTE — Assessment & Plan Note (Signed)
-   Monitor closely, shortness of breath likely due to right pleural effusion -No signs of volume overload -Continue with hemodialysis, -Currently seems to be euvolemic -Home medications

## 2023-05-05 NOTE — Assessment & Plan Note (Addendum)
-  Anemia of chronic disease, CKD, iron deficiency, - with baseline hemoglobin around 8.0  -Hemoglobin down to 7.1 -With nephrology [name] who agreed to proceed with 1U PRBC transfusion today    Latest Ref Rng & Units 05/05/2023    8:04 AM 04/14/2023   11:30 AM 04/13/2023    7:34 AM  CBC  WBC 4.0 - 10.5 K/uL 8.1  11.2  11.8   Hemoglobin 13.0 - 17.0 g/dL 7.1  7.8  8.4   Hematocrit 39.0 - 52.0 % 25.0  25.4  27.3   Platelets 150 - 400 K/uL 249  157  163

## 2023-05-05 NOTE — Assessment & Plan Note (Signed)
-  Currently on any medication at home (will reevaluate med rec) -Will initiate CBG q. ACHS, with Isai coverage -Check A1c last A1c 4.5

## 2023-05-05 NOTE — Assessment & Plan Note (Signed)
-   Continue wound care per nursing staff 

## 2023-05-05 NOTE — Assessment & Plan Note (Signed)
-   Chronic, with prosthesis -Stable, monitoring

## 2023-05-05 NOTE — Progress Notes (Signed)
 Patient did not receive unit of blood. Patient hgb is stable. Per MD ok to hold.

## 2023-05-05 NOTE — Assessment & Plan Note (Signed)
-  Possible breakthrough seizures -On home medication of Keppra -Obtaining Keppra level -Continue current regimen of Keppra per med rec

## 2023-05-05 NOTE — Assessment & Plan Note (Signed)
 Stable continue current meds: Amiodarone, Coreg, -

## 2023-05-05 NOTE — Progress Notes (Signed)
 Pharmacy Antibiotic Note  [name]. is a 53 y.o. male admitted on 05/05/2023 with  asp pna .  Pharmacy has been consulted for Unasyn dosing. Pt is ESRD - on HD.  Plan: Unasyn 3gm IV q24h Will f/u micro data and pt's clinicial condition.  Height: 6\' 3"  (190.5 cm) Weight: 79.8 kg (176 lb) IBW/kg (Calculated) : 84.5  Temp (24hrs), Avg:97.9 F (36.6 C), Min:97.7 F (36.5 C), Max:98.1 F (36.7 C)  Recent Labs  Lab 05/05/23 0804  WBC 8.1  CREATININE 8.93*    Estimated Creatinine Clearance: 10.5 mL/min (A) (by C-G formula based on SCr of 8.93 mg/dL (H)).    Allergies  Allergen Reactions   Promethazine Diarrhea and Other (See Comments)    Muscle cramps, cramping "all over"    Antimicrobials this admission: 3/13 Unasyn >>   Microbiology results: 3/13 BCx:  3/13 R pleural fluid:    Thank you for allowing pharmacy to be a part of this patient's care.  Christoper Fabian, PharmD, BCPS Please see amion for complete clinical pharmacist phone list 05/05/2023 4:44 PM

## 2023-05-05 NOTE — Assessment & Plan Note (Signed)
-   Stable, not complaining of any chest pain -Home medication including aspirin, Lipitor, Coreg

## 2023-05-05 NOTE — Consult Note (Signed)
 ESRD Consult Note  Reason for consult: ESRD, provision of dialysis  Assessment/Recommendations:  ESRD -outpatient HD orders: [name] TTS.  3 hours 15 minutes.  EDW: 77.5 kg (however likely well under this as per outpatient unit).  LUE AVF, 15-gauge needles.  Flow rates: 400/800.  37 degrees.  2K/2.5 calcium.  UF profile #2.  Heparin: Loading 1000 units, 400 units/h.  Meds: Mircera 100 mcg every 2 weeks (last dose 3/4) -HD tomorrow (short run) and then back on TTS schedule on Saturday.  No urgent indications for HD today  Right pleural effusion -Status post thoracentesis with IR 3/13 with 1.5 L drained.  Pulmonology consulted.  CT pending  Right BKA wound -Ortho ([name]) to see patient.  Antibiotics per primary service if indicated  Seizure disorder -Possible breakthrough seizure -Per primary  Hyperkalemia -Will give another dose of lokelma 10 g.  HD as above  Volume/ hypertension  Diastolic CHF -UF as tolerated, will need new EDW on discharge  Anemia of Chronic Kidney Disease Hemoglobin 8.7, status post 1 unit PRBC 3/13 for hemoglobin 7.1. -FU panel ordered -Start ESA 3/15 -Transfuse PRN for Hgb <7  Secondary Hyperparathyroidism/Hyperphosphatemia -Resume home binders if on any  DM2 -Per primary  # Additional recommendations: - Dose all meds for creatinine clearance < 10 ml/min  - Unless absolutely necessary, no MRIs with gadolinium.  - Implement save arm precautions.  Prefer needle sticks in the dorsum of the hands or wrists.  No blood pressure measurements in arm. - If blood transfusion is requested during hemodialysis sessions, please alert [place] prior to the session.  - If a hemodialysis catheter line culture is requested, please alert [place] as only hemodialysis nurses are able to collect those specimens.   Recommendations were discussed with the primary team.  Anthony Sar, MD [facility]  History of Present Illness: [name]. is  a/an 53 y.o. male with a past medical history of ESRD, right BKA, history of CVA, A-fib, DM2, CAD status post PCI, diastolic CHF, left eye blindness, PAD, hyperlipidemia, seizure disorder, chronic anemia who presents with seizure-like activity which occurred on dialysis.  Had dialysis for about 1.5 to 2 hours.  Found to have a large right pleural effusion with shift of mediastinal structures to the left.  Patient seen and examined postthoracentesis.  1.5 L drained.  Patient reports he has been having shortness of breath for about 3 weeks, unable to sleep because of this and has been having associated chest pressure.  He reports feeling much better after his thoracentesis.  He did miss his dialysis on Tuesday due to feeling poorly.  He reports that dialysis has been rough on him, typically has cramps.  Otherwise no complaints currently.  Denies any chest pain, worsening shortness of breath at this time.   Medications:  Current Facility-Administered Medications  Medication Dose Route Frequency Provider Last Rate Last Admin   acetaminophen (TYLENOL) tablet 650 mg  650 mg Oral Q6H PRN Shahmehdi, Seyed A, MD       Or   acetaminophen (TYLENOL) suppository 650 mg  650 mg Rectal Q6H PRN Kendell Bane, MD       [START ON 05/06/2023] amiodarone (PACERONE) tablet 100 mg  100 mg Oral BH-q7a Shahmehdi, Seyed A, MD       aspirin EC tablet 81 mg  81 mg Oral Daily Shahmehdi, Seyed A, MD   81 mg at 05/05/23 1221   atorvastatin (LIPITOR) tablet 80 mg  80 mg Oral QHS Shahmehdi,  Seyed A, MD       bisacodyl (DULCOLAX) EC tablet 5 mg  5 mg Oral Daily PRN Shahmehdi, Seyed A, MD       calcitRIOL (ROCALTROL) capsule 2.25 mcg  2.25 mcg Oral Q T,Th,Sat-1800 Shahmehdi, Seyed A, MD       calcium acetate (PHOSLO) capsule 1,334 mg  1,334 mg Oral TID WC Shahmehdi, Seyed A, MD       carvedilol (COREG) tablet 12.5 mg  12.5 mg Oral BID Shahmehdi, Seyed A, MD       cinacalcet (SENSIPAR) tablet 60 mg  60 mg Oral Q T,Th,Sat-1800  Shahmehdi, Seyed A, MD       docusate sodium (COLACE) capsule 100 mg  100 mg Oral BID Shahmehdi, Seyed A, MD       dorzolamide-timolol (COSOPT) 2-0.5 % ophthalmic solution 1 drop  1 drop Right Eye BID Shahmehdi, Seyed A, MD       heparin injection 5,000 Units  5,000 Units Subcutaneous Q8H Shahmehdi, Seyed A, MD       hydrALAZINE (APRESOLINE) injection 10 mg  10 mg Intravenous Q4H PRN Shahmehdi, Seyed A, MD   10 mg at 05/05/23 1341   hydrocortisone (ANUSOL-HC) 2.5 % rectal cream 1 Application  1 Application Rectal BID Shahmehdi, Seyed A, MD       HYDROmorphone (DILAUDID) injection 0.5-1 mg  0.5-1 mg Intravenous Q2H PRN Shahmehdi, Seyed A, MD       insulin aspart (novoLOG) injection 0-6 Units  0-6 Units Subcutaneous TID WC Shahmehdi, Seyed A, MD       ipratropium (ATROVENT) nebulizer solution 0.5 mg  0.5 mg Nebulization Q6H PRN Shahmehdi, Seyed A, MD       levalbuterol (XOPENEX) nebulizer solution 0.63 mg  0.63 mg Nebulization Q6H PRN Kendell Bane, MD       [START ON 05/06/2023] levETIRAcetam (KEPPRA) tablet 500 mg  500 mg Oral Once per day on Sunday Monday Wednesday Friday Kendell Bane, MD       levETIRAcetam (KEPPRA) tablet 750 mg  750 mg Oral Once per day on Tuesday Thursday Saturday Nevin Bloodgood A, MD   750 mg at 05/05/23 1221   [START ON 05/06/2023] lidocaine-prilocaine (EMLA) cream 1 Application  1 Application Topical Once per day on Monday Wednesday Friday Shahmehdi, Gemma Payor, MD       multivitamin (RENA-VIT) tablet 1 tablet  1 tablet Oral QHS Shahmehdi, Seyed A, MD       ondansetron (ZOFRAN) tablet 4 mg  4 mg Oral Q6H PRN Shahmehdi, Seyed A, MD       Or   ondansetron (ZOFRAN) injection 4 mg  4 mg Intravenous Q6H PRN Shahmehdi, Seyed A, MD       oxyCODONE (Oxy IR/ROXICODONE) immediate release tablet 5 mg  5 mg Oral Q4H PRN Shahmehdi, Seyed A, MD       senna-docusate (Senokot-S) tablet 1 tablet  1 tablet Oral QHS PRN Shahmehdi, Seyed A, MD       sodium chloride flush (NS) 0.9 %  injection 3 mL  3 mL Intravenous Q12H Shahmehdi, Seyed A, MD   3 mL at 05/05/23 1130   sodium chloride flush (NS) 0.9 % injection 3-10 mL  3-10 mL Intravenous PRN Shahmehdi, Seyed A, MD       sodium phosphate (FLEET) enema 1 enema  1 enema Rectal Once PRN Shahmehdi, Seyed A, MD       zolpidem (AMBIEN) tablet 5 mg  5 mg Oral QHS PRN,MR X 1 Shahmehdi,  Gemma Payor, MD         ALLERGIES Promethazine  MEDICAL HISTORY Past Medical History:  Diagnosis Date   Acute Bil  CVAs of Bil Frontal amd Rt Parietal  in Watershed Distribution 04/21/2021   Patchy acute cortically-based infarcts within the bilateral high  frontal lobes and right parietal lobe (predominantly in a watershed  distribution).   Subcentimeter acute infarct within the callosal splenium on the  left.     Acute bilateral cerebral infarction in a watershed distribution [facility]) 04/21/2021   a.) MRI brain 04/21/2021 --> patchy ACUTE cortically-based infarcts within the bilateral high frontal lobes and right parietal lobe   Acute cerebral infarction (HCC) 02/13/2021   a.) MRI brain 02/13/2021: acute LEFT hippocampal infarct   Acute cerebral infarction (HCC) 04/21/2021   a.) MRI brain 04/21/2021: subcentimeter ACUTE infarct within the callosal splenium on the LEFT   Acute osteomyelitis of toe of right foot with gangrene (HCC) 02/26/2022   Anaphylactic reaction due to adverse effect of correct drug or medicament properly administered, initial encounter 05/08/2019   Anemia of chronic renal failure    Anxiety    Aortic dilatation (HCC) 11/25/2020   a.) TTE 11/25/2020: Ao root measured 38 mm. b.) TTE 04/22/2021: Ao root measured 44 mm; c. 11/2021 Echo: Ao root 40mm.   Atrial fibrillation (HCC)    a.) CHA2DS2-VASc = 6 (CHF, HTN, CVA x2, prior MI, T2DM). b.) rate/rhythm maintained on oral amiodarone + carvedilol; chronic OAC/AP therapy discontinued following ICH   Benign prostatic hyperplasia with lower urinary tract symptoms 11/07/2019   BPH (benign  prostatic hyperplasia)    Cardiac arrest (HCC) 07/26/2021   a.) in setting of hyperkalemia, NSTEMI, and seizure following missed HD; pulseless and apneic --> required 1 round of CPR prior to ROSC   Cellulitis    Cerebral hemorrhage (HCC) 11/21/2021   a.) CT head 11/21/2021 --> 1.4 x 1.5 x 3.1 cm ACUTE hemorrhage within the inferior aspect of the fourth ventricle, and extending inferiorly through the foramen of foramen of [place] --> occurred in setting of HTN emergency and DOAC/APT-->eliquis/plavix d/c'd.   Cerebral microvascular disease    Chronic diarrhea    Chronic heart failure with preserved ejection fraction (HFpEF) (HCC) 07/19/2017   a.) 06/2017 Echo: EF 75%; b.) 11/2020 Echo: EF 55-60%; c.) 04/2021 Echo: EF 55-60%; d.) 11/2021 Echo: EF 65-70%, GrII DD, nl RV fxn, sev dil LA, large circumferential pericardial eff w/o tamponade, triv MR, AoV sclerosis. Ao root 40mm; e.) TTE 02/28/2025: EF 60-65%, sev LVH, mod posterior LV effusion, AoV sclerosis   Chronic pain of both ankles    Coronary artery disease 02/26/2020   a. 02/2020 Ant STEMI/PCI: LM nl, LAD 80m (3.5x30 Resolute Onyx), RI nl, LCX min irregs, RCA min irregs. EF 65%.   DDD (degenerative disc disease), cervical    Diabetes mellitus, type II (HCC)    Diabetic neuropathy (HCC)    Esophagitis    ESRD (end stage renal disease) on dialysis (HCC)    a.) [facility]; T-Th-Sat   Frequent falls    Gait instability    Gastritis    Gastroparesis    GERD (gastroesophageal reflux disease)    H/O enucleation of left eyeball    Heart palpitations    History of kidney stones    HLD (hyperlipidemia)    Hypertension    Insomnia    a.) uses melatonin PRN   Long term current use of amiodarone    Nausea and vomiting in adult  recurrent   NSTEMI (non-ST elevated myocardial infarction) (HCC) 07/26/2021   a.) hyperkalemic from missed HD; seizures; decompensated to cardiac arrest and required CPR x 1 round prior to ROSC.   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  Added automatically from request for surgery [id number]     Osteomyelitis [facility])    Pericardial effusion    a. 11/2021 Echo: EF 65-70%, GrII DD, nl RV fxn, sev dil LA, large circumferential pericardial eff w/o tamponade, triv MR, AoV sclerosis. Ao root 40mm.   Seizure (HCC)    a.) last 07/26/2021 in setting of missed HD --> hyperkalemic at 6.5 --> pulseless/apneic and required CPR; discharged home on levetiracetam.   ST elevation myocardial infarction (STEMI) of anterior wall (HCC) 02/26/2020   a.) LHC/PCI 02/26/2020 --> EF 65%; LVEDP 11 mmHg; 90% mLAD (3.5 x 20 mm Resolute Onyx DES x 1)     SOCIAL HISTORY Social History   Socioeconomic History   Marital status: Married    Spouse name: Martie Lee    Number of children: 2   Years of education: Not on file   Highest education level: High school graduate  Occupational History   Occupation: Disability    Comment: not employed  Tobacco Use   Smoking status: Never   Smokeless tobacco: Never  Vaping Use   Vaping status: Never Used  Substance and Sexual Activity   Alcohol use: No   Drug use: No   Sexual activity: Yes  Other Topics Concern   Not on file  Social History Narrative   Oldest son killed in car crash June 2020.  Lives in [place] with his wife.   Social Drivers of Health   Financial Resource Strain: High Risk (07/05/2017)   Overall Financial Resource Strain (CARDIA)    Difficulty of Paying Living Expenses: Hard  Food Insecurity: Patient Declined (03/31/2023)   Hunger Vital Sign    Worried About Running Out of Food in the Last Year: Patient declined    Ran Out of Food in the Last Year: Patient declined  Transportation Needs: Patient Declined (03/31/2023)   PRAPARE - Administrator, Civil Service (Medical): Patient declined    Lack of Transportation (Non-Medical): Patient declined  Physical Activity:  Inactive (07/05/2017)   Exercise Vital Sign    Days of Exercise per Week: 0 days    Minutes of Exercise per Session: 0 min  Stress: Stress Concern Present (07/05/2017)   [employer] of Occupational Health - Occupational Stress Questionnaire    Feeling of Stress : Rather much  Social Connections: Unknown (03/31/2023)   Social Connection and Isolation Panel [NHANES]    Frequency of Communication with Friends and Family: Patient declined    Frequency of Social Gatherings with Friends and Family: Patient declined    Attends Religious Services: Not on Insurance claims handler of Clubs or Organizations: Patient declined    Attends Banker Meetings: Patient declined    Marital Status: Patient declined  Intimate Partner Violence: Patient Declined (03/31/2023)   Humiliation, Afraid, Rape, and Kick questionnaire    Fear of Current or Ex-Partner: Patient declined    Emotionally Abused: Patient declined    Physically Abused: Patient declined    Sexually Abused: Patient declined     FAMILY HISTORY Family History  Problem Relation Age of Onset   CAD Father    Stroke Father    Diabetes Mellitus II  Mother    Kidney failure Mother    Schizophrenia Mother      Review of Systems: 70 systems were reviewed and negative except per HPI  Physical Exam: Vitals:   05/05/23 1225 05/05/23 1333  BP:  (!) 190/84  Pulse:  64  Resp:    Temp: 97.7 F (36.5 C) 98.1 F (36.7 C)  SpO2:  100%   Total I/O In: 96.5 [IV Piggyback:96.5] Out: -   Intake/Output Summary (Last 24 hours) at 05/05/2023 1601 Last data filed at 05/05/2023 0859 Gross per 24 hour  Intake 96.46 ml  Output --  Net 96.46 ml   General: well-appearing, no acute distress HEENT: anicteric sclera, MMM CV: normal rate, no murmurs, no edema Lungs: bilateral chest rise, normal wob, decreased breath sounds right base greater than left base, no rales/crackles appreciated Abd: soft, non-tender, non-distended Skin: no visible  lesions or rashes Extremities: Right BKA, no significant edema LLE Psych: alert, engaged, appropriate mood and affect Neuro: normal speech, no gross focal deficits  Dialysis access: LUE AVF +b/t  Test Results Reviewed Lab Results  Component Value Date   NA 137 05/05/2023   K 5.4 (H) 05/05/2023   CL 103 05/05/2023   CO2 20 (L) 05/05/2023   BUN 53 (H) 05/05/2023   CREATININE 8.93 (H) 05/05/2023   CALCIUM 9.1 05/05/2023   ALBUMIN 2.3 (L) 05/05/2023   PHOS 6.8 (H) 05/05/2023    I have reviewed relevant outside healthcare records

## 2023-05-05 NOTE — Consult Note (Signed)
 NEUROLOGY CONSULT NOTE   Date of service: May 05, 2023 Patient Name: [name]. MRN:  [id number] DOB:  November 25, 1970 Chief Complaint: "seizure like activity " Requesting Provider: Kendell Bane, MD  History of Present Illness  [name]. is a 53 y.o. male with hx of prior CVAs with residual left arm weakness, anxiety and depression, atrial fibrillation, cardiac arrest, ICH, CHF, CAD, diabetes, BPH, end-stage renal disease on dialysis, diabetic neuropathy, hypertension, hyperlipidemia, seizure disorder, who presents from dialysis today after having seizure-like activity.  Patient really does not remember event except he woke up with his hands in front of his face.  He has had a cough recently and he has had C. difficile for the last week, he also has an open wound on his right AKA.  Prior to today he states he has not had a seizure in years.  He states that he has been taking his Keppra as prescribed as he is in a rehab facility currently and has not missed any doses  Neurology consulted   ROS  Comprehensive ROS performed and pertinent positives documented in HPI    Past History   Past Medical History:  Diagnosis Date   Acute Bil  CVAs of Bil Frontal amd Rt Parietal  in Watershed Distribution 04/21/2021   Patchy acute cortically-based infarcts within the bilateral high  frontal lobes and right parietal lobe (predominantly in a watershed  distribution).   Subcentimeter acute infarct within the callosal splenium on the  left.     Acute bilateral cerebral infarction in a watershed distribution [facility]) 04/21/2021   a.) MRI brain 04/21/2021 --> patchy ACUTE cortically-based infarcts within the bilateral high frontal lobes and right parietal lobe   Acute cerebral infarction (HCC) 02/13/2021   a.) MRI brain 02/13/2021: acute LEFT hippocampal infarct   Acute cerebral infarction (HCC) 04/21/2021   a.) MRI brain 04/21/2021: subcentimeter ACUTE infarct within the callosal splenium  on the LEFT   Acute osteomyelitis of toe of right foot with gangrene (HCC) 02/26/2022   Anaphylactic reaction due to adverse effect of correct drug or medicament properly administered, initial encounter 05/08/2019   Anemia of chronic renal failure    Anxiety    Aortic dilatation (HCC) 11/25/2020   a.) TTE 11/25/2020: Ao root measured 38 mm. b.) TTE 04/22/2021: Ao root measured 44 mm; c. 11/2021 Echo: Ao root 40mm.   Atrial fibrillation (HCC)    a.) CHA2DS2-VASc = 6 (CHF, HTN, CVA x2, prior MI, T2DM). b.) rate/rhythm maintained on oral amiodarone + carvedilol; chronic OAC/AP therapy discontinued following ICH   Benign prostatic hyperplasia with lower urinary tract symptoms 11/07/2019   BPH (benign prostatic hyperplasia)    Cardiac arrest (HCC) 07/26/2021   a.) in setting of hyperkalemia, NSTEMI, and seizure following missed HD; pulseless and apneic --> required 1 round of CPR prior to ROSC   Cellulitis    Cerebral hemorrhage (HCC) 11/21/2021   a.) CT head 11/21/2021 --> 1.4 x 1.5 x 3.1 cm ACUTE hemorrhage within the inferior aspect of the fourth ventricle, and extending inferiorly through the foramen of foramen of [place] --> occurred in setting of HTN emergency and DOAC/APT-->eliquis/plavix d/c'd.   Cerebral microvascular disease    Chronic diarrhea    Chronic heart failure with preserved ejection fraction (HFpEF) (HCC) 07/19/2017   a.) 06/2017 Echo: EF 75%; b.) 11/2020 Echo: EF 55-60%; c.) 04/2021 Echo: EF 55-60%; d.) 11/2021 Echo: EF 65-70%, GrII DD, nl RV fxn, sev dil LA, large circumferential pericardial eff w/o  tamponade, triv MR, AoV sclerosis. Ao root 40mm; e.) TTE 02/28/2025: EF 60-65%, sev LVH, mod posterior LV effusion, AoV sclerosis   Chronic pain of both ankles    Coronary artery disease 02/26/2020   a. 02/2020 Ant STEMI/PCI: LM nl, LAD 82m (3.5x30 Resolute Onyx), RI nl, LCX min irregs, RCA min irregs. EF 65%.   DDD (degenerative disc disease), cervical    Diabetes mellitus, type II  (HCC)    Diabetic neuropathy (HCC)    Esophagitis    ESRD (end stage renal disease) on dialysis (HCC)    a.) [facility]; T-Th-Sat   Frequent falls    Gait instability    Gastritis    Gastroparesis    GERD (gastroesophageal reflux disease)    H/O enucleation of left eyeball    Heart palpitations    History of kidney stones    HLD (hyperlipidemia)    Hypertension    Insomnia    a.) uses melatonin PRN   Long term current use of amiodarone    Nausea and vomiting in adult    recurrent   NSTEMI (non-ST elevated myocardial infarction) (HCC) 07/26/2021   a.) hyperkalemic from missed HD; seizures; decompensated to cardiac arrest and required CPR x 1 round prior to ROSC.   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  Added automatically from request for surgery [id number]     Osteomyelitis [facility])    Pericardial effusion    a. 11/2021 Echo: EF 65-70%, GrII DD, nl RV fxn, sev dil LA, large circumferential pericardial eff w/o tamponade, triv MR, AoV sclerosis. Ao root 40mm.   Seizure (HCC)    a.) last 07/26/2021 in setting of missed HD --> hyperkalemic at 6.5 --> pulseless/apneic and required CPR; discharged home on levetiracetam.   ST elevation myocardial infarction (STEMI) of anterior wall (HCC) 02/26/2020   a.) LHC/PCI 02/26/2020 --> EF 65%; LVEDP 11 mmHg; 90% mLAD (3.5 x 20 mm Resolute Onyx DES x 1)    Past Surgical History:  Procedure Laterality Date   A/V FISTULAGRAM Left 08/31/2021   Procedure: A/V Fistulagram;  Surgeon: Annice Needy, MD;  Location: [name] INVASIVE CV LAB;  Service: Cardiovascular;  Laterality: Left;   A/V FISTULAGRAM Left 01/25/2022   Procedure: A/V Fistulagram;  Surgeon: Annice Needy, MD;  Location: [name] INVASIVE CV LAB;  Service: Cardiovascular;  Laterality: Left;   A/V FISTULAGRAM Right 10/18/2022   Procedure: A/V Fistulagram;  Surgeon: Annice Needy, MD;  Location: [name] INVASIVE CV LAB;   Service: Cardiovascular;  Laterality: Right;   ABDOMINAL AORTOGRAM W/LOWER EXTREMITY Bilateral 02/08/2023   Procedure: ABDOMINAL AORTOGRAM W/LOWER EXTREMITY;  Surgeon: Daria Pastures, MD;  Location: [facility] INVASIVE CV LAB;  Service: Vascular;  Laterality: Bilateral;   AMPUTATION Left 03/16/2016   Procedure: AMPUTATION DIGIT LEFT HALLUX;  Surgeon: Vivi Barrack, DPM;  Location: [facility] OR;  Service: Podiatry;  Laterality: Left;  can start around 5    AMPUTATION Right 02/09/2021   Procedure: AMPUTATION DIGIT;  Surgeon: Marlyne Beards, MD;  Location: [facility] OR;  Service: Orthopedics;  Laterality: Right;   AMPUTATION Right 02/04/2023   Procedure: AMPUTATION 4TH AND 5TH METATARSAL;  Surgeon: Pilar Plate, DPM;  Location: [facility] OR;  Service: Orthopedics/Podiatry;  Laterality: Right;  I&D, partial ray resection, abx beads   AMPUTATION Right 02/09/2023   Procedure: RIGHT BELOW KNEE AMPUTATION;  Surgeon: [name], MD;  Location: [facility] OR;  Service: Orthopedics;  Laterality: Right;  AMPUTATION Right 04/06/2023   Procedure: RIGHT ABOVE KNEE AMPUTATION;  Surgeon: [name], MD;  Location: [facility] OR;  Service: Orthopedics;  Laterality: Right;   AMPUTATION TOE Right 02/28/2022   Procedure: AMPUTATION TOE;  Surgeon: Louann Sjogren, DPM;  Location: [name] ORS;  Service: Podiatry;  Laterality: Right;  right fifth toe   ARTHROSCOPIC REPAIR ACL Left    AV FISTULA PLACEMENT Right 05/31/2019   Procedure: Brachiocephalic AV fistula creation;  Surgeon: Annice Needy, MD;  Location: [name] ORS;  Service: Vascular;  Laterality: Right;   AV FISTULA PLACEMENT Left 08/13/2021   Procedure: ARTERIOVENOUS (AV) FISTULA CREATION (BRACHIALCEPHALIC);  Surgeon: Annice Needy, MD;  Location: [name] ORS;  Service: Vascular;  Laterality: Left;   COLONOSCOPY WITH PROPOFOL N/A 10/28/2015   Procedure: COLONOSCOPY WITH PROPOFOL;  Surgeon: Christena Deem, MD;  Location: [facility] ENDOSCOPY;  Service: Endoscopy;  Laterality: N/A;    COLONOSCOPY WITH PROPOFOL N/A 10/29/2015   Procedure: COLONOSCOPY WITH PROPOFOL;  Surgeon: Christena Deem, MD;  Location: [facility] ENDOSCOPY;  Service: Endoscopy;  Laterality: N/A;   COLONOSCOPY WITH PROPOFOL N/A 01/27/2023   Procedure: COLONOSCOPY WITH PROPOFOL;  Surgeon: Franky Macho, MD;  Location: [facility] ENDO SUITE;  Service: Endoscopy;  Laterality: N/A;  12:30pm, asa 3   CORONARY ULTRASOUND/IVUS N/A 02/26/2020   Procedure: Intravascular Ultrasound/IVUS;  Surgeon: Corky Crafts, MD;  Location: [facility] INVASIVE CV LAB;  Service: Cardiovascular;  Laterality: N/A;   CORONARY/GRAFT ACUTE MI REVASCULARIZATION N/A 02/26/2020   Procedure: Coronary/Graft Acute MI Revascularization;  Surgeon: Corky Crafts, MD;  Location: [facility] INVASIVE CV LAB;  Service: Cardiovascular;  Laterality: N/A;   DIALYSIS/PERMA CATHETER INSERTION Right 04/26/2019   Perm Cath    DIALYSIS/PERMA CATHETER INSERTION N/A 04/26/2019   Procedure: DIALYSIS/PERMA CATHETER INSERTION;  Surgeon: Annice Needy, MD;  Location: [name] INVASIVE CV LAB;  Service: Cardiovascular;  Laterality: N/A;   DIALYSIS/PERMA CATHETER INSERTION N/A 05/25/2021   Procedure: DIALYSIS/PERMA CATHETER INSERTION;  Surgeon: Annice Needy, MD;  Location: [name] INVASIVE CV LAB;  Service: Cardiovascular;  Laterality: N/A;   DIALYSIS/PERMA CATHETER INSERTION N/A 08/31/2021   Procedure: DIALYSIS/PERMA CATHETER INSERTION;  Surgeon: Annice Needy, MD;  Location: [name] INVASIVE CV LAB;  Service: Cardiovascular;  Laterality: N/A;   DIALYSIS/PERMA CATHETER REMOVAL N/A 08/15/2019   Procedure: DIALYSIS/PERMA CATHETER REMOVAL;  Surgeon: Renford Dills, MD;  Location: [name] INVASIVE CV LAB;  Service: Cardiovascular;  Laterality: N/A;   DIALYSIS/PERMA CATHETER REMOVAL N/A 03/15/2022   Procedure: DIALYSIS/PERMA CATHETER REMOVAL;  Surgeon: Annice Needy, MD;  Location: [name] INVASIVE CV LAB;  Service: Cardiovascular;  Laterality: N/A;   EMBOLIZATION (CATH LAB) Right 02/06/2021    Procedure: EMBOLIZATION;  Surgeon: Annice Needy, MD;  Location: [name] INVASIVE CV LAB;  Service: Cardiovascular;  Laterality: Right;  Right Upper Extremity Dialysis Access, Permcath Placement   ESOPHAGOGASTRODUODENOSCOPY (EGD) WITH PROPOFOL N/A 12/27/2017   Procedure: ESOPHAGOGASTRODUODENOSCOPY (EGD) WITH PROPOFOL;  Surgeon: Toledo, Boykin Nearing, MD;  Location: [name] ENDOSCOPY;  Service: Gastroenterology;  Laterality: N/A;   EYE SURGERY     IR THORACENTESIS ASP PLEURAL SPACE W/IMG GUIDE  05/05/2023   LEFT HEART CATH AND CORONARY ANGIOGRAPHY N/A 02/26/2020   Procedure: LEFT HEART CATH AND CORONARY ANGIOGRAPHY;  Surgeon: Corky Crafts, MD;  Location: [facility] INVASIVE CV LAB;  Service: Cardiovascular;  Laterality: N/A;   PROSTATE SURGERY  2016   REVISON OF ARTERIOVENOUS FISTULA Right 07/22/2022   Procedure: RESECTION OF ANEURYSMAL RIGHT ARM ARTERIOVENOUS FISTULA;  Surgeon: Annice Needy, MD;  Location: [name] ORS;  Service: Vascular;  Laterality: Right;   TONSILECTOMY/ADENOIDECTOMY WITH MYRINGOTOMY     TONSILLECTOMY     UPPER EXTREMITY ANGIOGRAPHY Right 11/26/2020   Procedure: Upper Extremity Angiography;  Surgeon: Annice Needy, MD;  Location: [name] INVASIVE CV LAB;  Service: Cardiovascular;  Laterality: Right;   UPPER EXTREMITY ANGIOGRAPHY Right 02/05/2021   Procedure: UPPER EXTREMITY ANGIOGRAPHY;  Surgeon: Annice Needy, MD;  Location: [name] INVASIVE CV LAB;  Service: Cardiovascular;  Laterality: Right;    Family History: Family History  Problem Relation Age of Onset   CAD Father    Stroke Father    Diabetes Mellitus II Mother    Kidney failure Mother    Schizophrenia Mother     Social History  reports that he has never smoked. He has never used smokeless tobacco. He reports that he does not drink alcohol and does not use drugs.  Allergies  Allergen Reactions   Promethazine Diarrhea and Other (See Comments)    Muscle cramps, cramping "all over"    Medications   Current  Facility-Administered Medications:    acetaminophen (TYLENOL) tablet 650 mg, 650 mg, Oral, Q6H PRN **OR** acetaminophen (TYLENOL) suppository 650 mg, 650 mg, Rectal, Q6H PRN, Shahmehdi, Gemma Payor, MD   [START ON 05/06/2023] amiodarone (PACERONE) tablet 100 mg, 100 mg, Oral, BH-q7a, Shahmehdi, Seyed A, MD   Ampicillin-Sulbactam (UNASYN) 3 g in sodium chloride 0.9 % 100 mL IVPB, 3 g, Intravenous, Q24H, Titus Mould, RPH   aspirin EC tablet 81 mg, 81 mg, Oral, Daily, Shahmehdi, Seyed A, MD, 81 mg at 05/05/23 1221   atorvastatin (LIPITOR) tablet 80 mg, 80 mg, Oral, QHS, Shahmehdi, Seyed A, MD   bisacodyl (DULCOLAX) EC tablet 5 mg, 5 mg, Oral, Daily PRN, Shahmehdi, Seyed A, MD   calcitRIOL (ROCALTROL) capsule 2.25 mcg, 2.25 mcg, Oral, Q T,Th,Sat-1800, Shahmehdi, Seyed A, MD   calcium acetate (PHOSLO) capsule 1,334 mg, 1,334 mg, Oral, TID WC, Shahmehdi, Seyed A, MD   carvedilol (COREG) tablet 12.5 mg, 12.5 mg, Oral, BID, Shahmehdi, Seyed A, MD   [START ON 05/06/2023] Chlorhexidine Gluconate Cloth 2 % PADS 6 each, 6 each, Topical, Q0600, Anthony Sar, MD   cinacalcet (SENSIPAR) tablet 60 mg, 60 mg, Oral, Q T,Th,Sat-1800, Shahmehdi, Seyed A, MD   docusate sodium (COLACE) capsule 100 mg, 100 mg, Oral, BID, Shahmehdi, Seyed A, MD   dorzolamide-timolol (COSOPT) 2-0.5 % ophthalmic solution 1 drop, 1 drop, Right Eye, BID, Shahmehdi, Seyed A, MD   heparin injection 5,000 Units, 5,000 Units, Subcutaneous, Q8H, Shahmehdi, Seyed A, MD   hydrALAZINE (APRESOLINE) injection 10 mg, 10 mg, Intravenous, Q4H PRN, Shahmehdi, Seyed A, MD, 10 mg at 05/05/23 1341   hydrocortisone (ANUSOL-HC) 2.5 % rectal cream 1 Application, 1 Application, Rectal, BID, Shahmehdi, Seyed A, MD   HYDROmorphone (DILAUDID) injection 0.5-1 mg, 0.5-1 mg, Intravenous, Q2H PRN, Shahmehdi, Seyed A, MD   insulin aspart (novoLOG) injection 0-6 Units, 0-6 Units, Subcutaneous, TID WC, Shahmehdi, Seyed A, MD   ipratropium (ATROVENT) nebulizer solution 0.5  mg, 0.5 mg, Nebulization, Q6H PRN, Shahmehdi, Seyed A, MD   levalbuterol (XOPENEX) nebulizer solution 0.63 mg, 0.63 mg, Nebulization, Q6H PRN, Shahmehdi, Seyed A, MD   [START ON 05/06/2023] levETIRAcetam (KEPPRA) tablet 500 mg, 500 mg, Oral, Once per day on Sunday Monday Wednesday Friday, Shahmehdi, Seyed A, MD   levETIRAcetam (KEPPRA) tablet 750 mg, 750 mg, Oral, Once per day on Tuesday Thursday Saturday, Shahmehdi, Guadalupe Maple A, MD, 750 mg at 05/05/23 1221   [START  ON 05/06/2023] lidocaine-prilocaine (EMLA) cream 1 Application, 1 Application, Topical, Once per day on Monday Wednesday Friday, Shahmehdi, Seyed A, MD   multivitamin (RENA-VIT) tablet 1 tablet, 1 tablet, Oral, QHS, Shahmehdi, Seyed A, MD   ondansetron (ZOFRAN) tablet 4 mg, 4 mg, Oral, Q6H PRN **OR** ondansetron (ZOFRAN) injection 4 mg, 4 mg, Intravenous, Q6H PRN, Shahmehdi, Seyed A, MD   oxyCODONE (Oxy IR/ROXICODONE) immediate release tablet 5 mg, 5 mg, Oral, Q4H PRN, Shahmehdi, Seyed A, MD   senna-docusate (Senokot-S) tablet 1 tablet, 1 tablet, Oral, QHS PRN, Shahmehdi, Seyed A, MD   sodium chloride flush (NS) 0.9 % injection 3 mL, 3 mL, Intravenous, Q12H, Shahmehdi, Seyed A, MD, 3 mL at 2023/05/18 1130   sodium chloride flush (NS) 0.9 % injection 3-10 mL, 3-10 mL, Intravenous, PRN, Shahmehdi, Seyed A, MD   sodium phosphate (FLEET) enema 1 enema, 1 enema, Rectal, Once PRN, Shahmehdi, Seyed A, MD   sodium zirconium cyclosilicate (LOKELMA) packet 10 g, 10 g, Oral, Once, Anthony Sar, MD   zolpidem (AMBIEN) tablet 5 mg, 5 mg, Oral, QHS PRN,MR X 1, Shahmehdi, Seyed A, MD  Vitals   Vitals:   05-18-2023 1100 05-18-2023 1130 May 18, 2023 1225 May 18, 2023 1333  BP: (!) 208/90 (!) 186/88  (!) 190/84  Pulse: 63 63  64  Resp: 14 13    Temp:   97.7 F (36.5 C) 98.1 F (36.7 C)  TempSrc:   Oral Oral  SpO2: 100% 100%  100%  Weight:      Height:        Body mass index is 22 kg/m.  Physical Exam   Constitutional: Appears well-developed and  well-nourished.  Psych: Affect appropriate to situation.  Eyes: No scleral injection.  HENT: No OP obstruction.  Head: Normocephalic.  Cardiovascular: Normal rate and regular rhythm.  Respiratory: Effort normal, non-labored breathing.  GI: Soft.  No distension. There is no tenderness.  Skin: WDI.   Neurologic Examination   Mental Status -  Level of arousal and orientation to time, place, and person were intact. Language including expression, naming, repetition, comprehension was assessed and found intact. Attention span and concentration were normal. Recent and remote memory were intact. Fund of Knowledge was assessed and was intact.  Cranial Nerves II - XII - II - Visual field intact OU. III, IV, VI - Extraocular movements intact. V - Facial sensation intact bilaterally. VII - Facial movement intact bilaterally. VIII - Hearing & vestibular intact bilaterally. X - Palate elevates symmetrically. XI - Chin turning & shoulder shrug intact bilaterally. XII - Tongue protrusion intact.  Motor Strength - left arm 3/5, contracted hand, left leg 5/5, right arm 5/5, right leg AKA  Bulk was normal and fasciculations were absent.   Motor Tone - Muscle tone was assessed at the neck and appendages and was normal . Sensory - Light touch, temperature/pinprick were assessed and were symmetrical.   Coordination - The patient had normal movements in the hands and feet with no ataxia or dysmetria.  Tremor was absent. Gait and Station - deferred.  Labs/Imaging/Neurodiagnostic studies   CBC:  Recent Labs  Lab 2023-05-18 0804 2023-05-18 1124  WBC 8.1  --   NEUTROABS 5.3  --   HGB 7.1* 8.7*  HCT 25.0* 30.8*  MCV 71.8*  --   PLT 249  --    Basic Metabolic Panel:  Lab Results  Component Value Date   NA 137 05-18-2023   K 5.4 (H) 2023/05/18   CO2 20 (L) 05/18/2023  GLUCOSE 92 05/05/2023   BUN 53 (H) 05/05/2023   CREATININE 8.93 (H) 05/05/2023   CALCIUM 9.1 05/05/2023   GFRNONAA 6 (L)  05/05/2023   GFRAA 5 (L) 08/27/2019   Lipid Panel:  Lab Results  Component Value Date   LDLCALC 23 02/09/2023   HgbA1c:  Lab Results  Component Value Date   HGBA1C 5.0 05/05/2023   Urine Drug Screen:     Component Value Date/Time   LABOPIA NONE DETECTED 10/14/2017 1800   COCAINSCRNUR NONE DETECTED 10/14/2017 1800   COCAINSCRNUR NONE DETECTED 07/18/2017 1652   LABBENZ NONE DETECTED 10/14/2017 1800   AMPHETMU NONE DETECTED 10/14/2017 1800   THCU NONE DETECTED 10/14/2017 1800   LABBARB NONE DETECTED 10/14/2017 1800    Alcohol Level     Component Value Date/Time   ETH <10 05/05/2023 0804   INR  Lab Results  Component Value Date   INR 1.2 05/05/2023   APTT  Lab Results  Component Value Date   APTT 31 03/01/2022   AED levels:  Lab Results  Component Value Date   LEVETIRACETA 20.1 04/06/2022     ASSESSMENT   Stepan Ridgely Anastacio. is a 53 y.o. male with hx of prior CVAs with residual left arm weakness, anxiety and depression, atrial fibrillation, cardiac arrest, ICH, CHF, CAD, diabetes, BPH, end-stage renal disease on dialysis, diabetic neuropathy, hypertension, hyperlipidemia, seizure disorder, who presents from dialysis today after having seizure-like activity.  Patient really does not remember event except he woke up with his hands in front of his face.  He has had a cough recently and he has had C. difficile for the last week, he also has an open wound on his right AKA.  RECOMMENDATIONS  - Seizure precautions  - Continue home Keppra  - Continue to evaluate and treat infections - Correct metabolic derangements  ______________________________________________________________________  Gevena Mart DNP, ACNPC-AG  [facility]

## 2023-05-05 NOTE — Assessment & Plan Note (Signed)
-   Aspirin and Lipitor -History of BKA

## 2023-05-05 NOTE — ED Notes (Signed)
 ED TO INPATIENT HANDOFF REPORT  ED Nurse Name and Phone #: Haze Justin  S Name/Age/Gender [name]. 53 y.o. male Room/Bed: APA02/APA02  Code Status   Code Status: Full Code  Home/SNF/Other Nursing Home Patient oriented to: self, place, time, and situation Is this baseline? Yes   Triage Complete: Triage complete  Chief Complaint Pleural effusion [J90] Seizures (HCC) [R56.9]  Triage Note Patient who lives in [place] was brought from dialysis by [facility] for 2 seizures. Patient has had shortness of breath for several days when laying down, bed sores, and sores on his amputated leg.     Allergies Allergies  Allergen Reactions   Promethazine Diarrhea and Other (See Comments)    Muscle cramps, cramping "all over"    Level of Care/Admitting Diagnosis ED Disposition     ED Disposition  Admit   Condition  --   Comment  Hospital Area: [facility] [[id number]]  Level of Care: Telemetry Medical [104]  May admit patient to [facility] or [facility] if equivalent level of care is available:: No  Covid Evaluation: Confirmed COVID Negative  Diagnosis: Seizures [facility]) [[id number]]  Admitting Physician: Felipa Furnace  Attending Physician: Kendell Bane [phone]  Certification:: I certify this patient will need inpatient services for at least 2 midnights  Expected Medical Readiness: 05/09/2023          B Medical/Surgery History Past Medical History:  Diagnosis Date   Acute Bil  CVAs of Bil Frontal amd Rt Parietal  in Watershed Distribution 04/21/2021   Patchy acute cortically-based infarcts within the bilateral high  frontal lobes and right parietal lobe (predominantly in a watershed  distribution).   Subcentimeter acute infarct within the callosal splenium on the  left.     Acute bilateral cerebral infarction in a watershed distribution [facility]) 04/21/2021   a.) MRI brain 04/21/2021 --> patchy ACUTE cortically-based infarcts  within the bilateral high frontal lobes and right parietal lobe   Acute cerebral infarction (HCC) 02/13/2021   a.) MRI brain 02/13/2021: acute LEFT hippocampal infarct   Acute cerebral infarction (HCC) 04/21/2021   a.) MRI brain 04/21/2021: subcentimeter ACUTE infarct within the callosal splenium on the LEFT   Acute osteomyelitis of toe of right foot with gangrene (HCC) 02/26/2022   Anaphylactic reaction due to adverse effect of correct drug or medicament properly administered, initial encounter 05/08/2019   Anemia of chronic renal failure    Anxiety    Aortic dilatation (HCC) 11/25/2020   a.) TTE 11/25/2020: Ao root measured 38 mm. b.) TTE 04/22/2021: Ao root measured 44 mm; c. 11/2021 Echo: Ao root 40mm.   Atrial fibrillation (HCC)    a.) CHA2DS2-VASc = 6 (CHF, HTN, CVA x2, prior MI, T2DM). b.) rate/rhythm maintained on oral amiodarone + carvedilol; chronic OAC/AP therapy discontinued following ICH   Benign prostatic hyperplasia with lower urinary tract symptoms 11/07/2019   BPH (benign prostatic hyperplasia)    Cardiac arrest (HCC) 07/26/2021   a.) in setting of hyperkalemia, NSTEMI, and seizure following missed HD; pulseless and apneic --> required 1 round of CPR prior to ROSC   Cellulitis    Cerebral hemorrhage (HCC) 11/21/2021   a.) CT head 11/21/2021 --> 1.4 x 1.5 x 3.1 cm ACUTE hemorrhage within the inferior aspect of the fourth ventricle, and extending inferiorly through the foramen of foramen of [place] --> occurred in setting of HTN emergency and DOAC/APT-->eliquis/plavix d/c'd.   Cerebral microvascular disease    Chronic diarrhea  Chronic heart failure with preserved ejection fraction (HFpEF) (HCC) 07/19/2017   a.) 06/2017 Echo: EF 75%; b.) 11/2020 Echo: EF 55-60%; c.) 04/2021 Echo: EF 55-60%; d.) 11/2021 Echo: EF 65-70%, GrII DD, nl RV fxn, sev dil LA, large circumferential pericardial eff w/o tamponade, triv MR, AoV sclerosis. Ao root 40mm; e.) TTE 02/28/2025: EF 60-65%, sev  LVH, mod posterior LV effusion, AoV sclerosis   Chronic pain of both ankles    Coronary artery disease 02/26/2020   a. 02/2020 Ant STEMI/PCI: LM nl, LAD 63m (3.5x30 Resolute Onyx), RI nl, LCX min irregs, RCA min irregs. EF 65%.   DDD (degenerative disc disease), cervical    Diabetes mellitus, type II (HCC)    Diabetic neuropathy (HCC)    Esophagitis    ESRD (end stage renal disease) on dialysis (HCC)    a.) [facility]; T-Th-Sat   Frequent falls    Gait instability    Gastritis    Gastroparesis    GERD (gastroesophageal reflux disease)    H/O enucleation of left eyeball    Heart palpitations    History of kidney stones    HLD (hyperlipidemia)    Hypertension    Insomnia    a.) uses melatonin PRN   Long term current use of amiodarone    Nausea and vomiting in adult    recurrent   NSTEMI (non-ST elevated myocardial infarction) (HCC) 07/26/2021   a.) hyperkalemic from missed HD; seizures; decompensated to cardiac arrest and required CPR x 1 round prior to ROSC.   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  Added automatically from request for surgery [id number]     Osteomyelitis [facility])    Pericardial effusion    a. 11/2021 Echo: EF 65-70%, GrII DD, nl RV fxn, sev dil LA, large circumferential pericardial eff w/o tamponade, triv MR, AoV sclerosis. Ao root 40mm.   Seizure (HCC)    a.) last 07/26/2021 in setting of missed HD --> hyperkalemic at 6.5 --> pulseless/apneic and required CPR; discharged home on levetiracetam.   ST elevation myocardial infarction (STEMI) of anterior wall (HCC) 02/26/2020   a.) LHC/PCI 02/26/2020 --> EF 65%; LVEDP 11 mmHg; 90% mLAD (3.5 x 20 mm Resolute Onyx DES x 1)   Past Surgical History:  Procedure Laterality Date   A/V FISTULAGRAM Left 08/31/2021   Procedure: A/V Fistulagram;  Surgeon: Annice Needy, MD;  Location: [name] INVASIVE CV LAB;  Service: Cardiovascular;  Laterality:  Left;   A/V FISTULAGRAM Left 01/25/2022   Procedure: A/V Fistulagram;  Surgeon: Annice Needy, MD;  Location: [name] INVASIVE CV LAB;  Service: Cardiovascular;  Laterality: Left;   A/V FISTULAGRAM Right 10/18/2022   Procedure: A/V Fistulagram;  Surgeon: Annice Needy, MD;  Location: [name] INVASIVE CV LAB;  Service: Cardiovascular;  Laterality: Right;   ABDOMINAL AORTOGRAM W/LOWER EXTREMITY Bilateral 02/08/2023   Procedure: ABDOMINAL AORTOGRAM W/LOWER EXTREMITY;  Surgeon: Daria Pastures, MD;  Location: [facility] INVASIVE CV LAB;  Service: Vascular;  Laterality: Bilateral;   AMPUTATION Left 03/16/2016   Procedure: AMPUTATION DIGIT LEFT HALLUX;  Surgeon: Vivi Barrack, DPM;  Location: [facility] OR;  Service: Podiatry;  Laterality: Left;  can start around 5    AMPUTATION Right 02/09/2021   Procedure: AMPUTATION DIGIT;  Surgeon: Marlyne Beards, MD;  Location: [facility] OR;  Service: Orthopedics;  Laterality: Right;   AMPUTATION Right 02/04/2023   Procedure: AMPUTATION 4TH AND 5TH METATARSAL;  Surgeon: Pilar Plate, DPM;  Location: [facility] OR;  Service: Orthopedics/Podiatry;  Laterality: Right;  I&D, partial ray resection, abx beads   AMPUTATION Right 02/09/2023   Procedure: RIGHT BELOW KNEE AMPUTATION;  Surgeon: [name], MD;  [facility] OR;  Service: Orthopedics;  Laterality: Right;   AMPUTATION Right 04/06/2023   Procedure: RIGHT ABOVE KNEE AMPUTATION;  Surgeon: [name], MD;  Location: [facility] OR;  Service: Orthopedics;  Laterality: Right;   AMPUTATION TOE Right 02/28/2022   Procedure: AMPUTATION TOE;  Surgeon: Louann Sjogren, DPM;  Location: [name] ORS;  Service: Podiatry;  Laterality: Right;  right fifth toe   ARTHROSCOPIC REPAIR ACL Left    AV FISTULA PLACEMENT Right 05/31/2019   Procedure: Brachiocephalic AV fistula creation;  Surgeon: Annice Needy, MD;  Location: [name] ORS;  Service: Vascular;  Laterality: Right;   AV FISTULA PLACEMENT Left 08/13/2021   Procedure: ARTERIOVENOUS (AV) FISTULA CREATION  (BRACHIALCEPHALIC);  Surgeon: Annice Needy, MD;  Location: [name] ORS;  Service: Vascular;  Laterality: Left;   COLONOSCOPY WITH PROPOFOL N/A 10/28/2015   Procedure: COLONOSCOPY WITH PROPOFOL;  Surgeon: Christena Deem, MD;  Location: [facility] ENDOSCOPY;  Service: Endoscopy;  Laterality: N/A;   COLONOSCOPY WITH PROPOFOL N/A 10/29/2015   Procedure: COLONOSCOPY WITH PROPOFOL;  Surgeon: Christena Deem, MD;  Location: [facility] ENDOSCOPY;  Service: Endoscopy;  Laterality: N/A;   COLONOSCOPY WITH PROPOFOL N/A 01/27/2023   Procedure: COLONOSCOPY WITH PROPOFOL;  Surgeon: Franky Macho, MD;  Location: [facility] ENDO [facility];  Service: Endoscopy;  Laterality: N/A;  12:30pm, asa 3   CORONARY ULTRASOUND/IVUS N/A 02/26/2020   Procedure: Intravascular Ultrasound/IVUS;  Surgeon: Corky Crafts, MD;  Location: [facility] INVASIVE CV LAB;  Service: Cardiovascular;  Laterality: N/A;   CORONARY/GRAFT ACUTE MI REVASCULARIZATION N/A 02/26/2020   Procedure: Coronary/Graft Acute MI Revascularization;  Surgeon: Corky Crafts, MD;  Location: [facility] INVASIVE CV LAB;  Service: Cardiovascular;  Laterality: N/A;   DIALYSIS/PERMA CATHETER INSERTION Right 04/26/2019   Perm Cath    DIALYSIS/PERMA CATHETER INSERTION N/A 04/26/2019   Procedure: DIALYSIS/PERMA CATHETER INSERTION;  Surgeon: Annice Needy, MD;  Location: [name] INVASIVE CV LAB;  Service: Cardiovascular;  Laterality: N/A;   DIALYSIS/PERMA CATHETER INSERTION N/A 05/25/2021   Procedure: DIALYSIS/PERMA CATHETER INSERTION;  Surgeon: Annice Needy, MD;  Location: [name] INVASIVE CV LAB;  Service: Cardiovascular;  Laterality: N/A;   DIALYSIS/PERMA CATHETER INSERTION N/A 08/31/2021   Procedure: DIALYSIS/PERMA CATHETER INSERTION;  Surgeon: Annice Needy, MD;  Location: [name] INVASIVE CV LAB;  Service: Cardiovascular;  Laterality: N/A;   DIALYSIS/PERMA CATHETER REMOVAL N/A 08/15/2019   Procedure: DIALYSIS/PERMA CATHETER REMOVAL;  Surgeon: Renford Dills, MD;  Location: [name] INVASIVE CV LAB;   Service: Cardiovascular;  Laterality: N/A;   DIALYSIS/PERMA CATHETER REMOVAL N/A 03/15/2022   Procedure: DIALYSIS/PERMA CATHETER REMOVAL;  Surgeon: Annice Needy, MD;  Location: [name] INVASIVE CV LAB;  Service: Cardiovascular;  Laterality: N/A;   EMBOLIZATION (CATH LAB) Right 02/06/2021   Procedure: EMBOLIZATION;  Surgeon: Annice Needy, MD;  Location: [name] INVASIVE CV LAB;  Service: Cardiovascular;  Laterality: Right;  Right Upper Extremity Dialysis Access, Permcath Placement   ESOPHAGOGASTRODUODENOSCOPY (EGD) WITH PROPOFOL N/A 12/27/2017   Procedure: ESOPHAGOGASTRODUODENOSCOPY (EGD) WITH PROPOFOL;  Surgeon: Toledo, Boykin Nearing, MD;  Location: [name] ENDOSCOPY;  Service: Gastroenterology;  Laterality: N/A;   EYE SURGERY     LEFT HEART CATH AND CORONARY ANGIOGRAPHY N/A 02/26/2020   Procedure: LEFT HEART CATH AND CORONARY ANGIOGRAPHY;  Surgeon: Corky Crafts, MD;  Location: [facility] INVASIVE CV LAB;  Service:  Cardiovascular;  Laterality: N/A;   PROSTATE SURGERY  2016   REVISON OF ARTERIOVENOUS FISTULA Right 07/22/2022   Procedure: RESECTION OF ANEURYSMAL RIGHT ARM ARTERIOVENOUS FISTULA;  Surgeon: Annice Needy, MD;  Location: [name] ORS;  Service: Vascular;  Laterality: Right;   TONSILECTOMY/ADENOIDECTOMY WITH MYRINGOTOMY     TONSILLECTOMY     UPPER EXTREMITY ANGIOGRAPHY Right 11/26/2020   Procedure: Upper Extremity Angiography;  Surgeon: Annice Needy, MD;  Location: [name] INVASIVE CV LAB;  Service: Cardiovascular;  Laterality: Right;   UPPER EXTREMITY ANGIOGRAPHY Right 02/05/2021   Procedure: UPPER EXTREMITY ANGIOGRAPHY;  Surgeon: Annice Needy, MD;  Location: [name] INVASIVE CV LAB;  Service: Cardiovascular;  Laterality: Right;     A IV Location/Drains/Wounds Patient Lines/Drains/Airways Status     Active Line/Drains/Airways     Name Placement date Placement time Site Days   Peripheral IV 05/05/23 22 G 1" Right Other (Comment) 05/05/23  0821  Other (Comment)  less than 1   Fistula / Graft Left Upper  arm --  --  Upper arm  --   Pressure Injury 02/10/23 Sacrum Right;Upper;Mid Stage 2 -  Partial thickness loss of dermis presenting as a shallow open injury with a red, pink wound bed without slough. 02/10/23  --  -- 84   Pressure Injury 03/25/23 Other (Comment) Right Unstageable - Full thickness tissue loss in which the base of the injury is covered by slough (yellow, tan, gray, green or brown) and/or eschar (tan, brown or black) in the wound bed. 03/25/23  1200  -- 41   Pressure Injury 04/13/23 Heel Left Unstageable - Full thickness tissue loss in which the base of the injury is covered by slough (yellow, tan, gray, green or brown) and/or eschar (tan, brown or black) in the wound bed. 04/13/23  1148  -- 22            Intake/Output Last 24 hours  Intake/Output Summary (Last 24 hours) at 05/05/2023 1239 Last data filed at 05/05/2023 0859 Gross per 24 hour  Intake 96.46 ml  Output --  Net 96.46 ml    Labs/Imaging Results for orders placed or performed during the hospital encounter of 05/05/23 (from the past 48 hours)  Comprehensive metabolic panel     Status: Abnormal   Collection Time: 05/05/23  8:04 AM  Result Value Ref Range   Sodium 137 135 - 145 mmol/L   Potassium 5.4 (H) 3.5 - 5.1 mmol/L   Chloride 103 98 - 111 mmol/L   CO2 20 (L) 22 - 32 mmol/L   Glucose, Bld 92 70 - 99 mg/dL    Comment: Glucose reference range applies only to samples taken after fasting for at least 8 hours.   BUN 53 (H) 6 - 20 mg/dL   Creatinine, Ser 1.61 (H) 0.61 - 1.24 mg/dL   Calcium 9.1 8.9 - 09.6 mg/dL   Total Protein 6.5 6.5 - 8.1 g/dL   Albumin 2.3 (L) 3.5 - 5.0 g/dL   AST 13 (L) 15 - 41 U/L   ALT 10 0 - 44 U/L   Alkaline Phosphatase 58 38 - 126 U/L   Total Bilirubin 0.6 0.0 - 1.2 mg/dL   GFR, Estimated 6 (L) >60 mL/min    Comment: (NOTE) Calculated using the CKD-EPI Creatinine Equation (2021)    Anion gap 14 5 - 15    Comment: Performed at [facility], [address], [place], [place]  [address]  CBC with Differential/Platelet     Status: Abnormal  Collection Time: 05/05/23  8:04 AM  Result Value Ref Range   WBC 8.1 4.0 - 10.5 K/uL   RBC 3.48 (L) 4.22 - 5.81 MIL/uL   Hemoglobin 7.1 (L) 13.0 - 17.0 g/dL    Comment: REPEATED TO VERIFY Reticulocyte Hemoglobin testing may be clinically indicated, consider ordering this additional test [id number]    HCT 25.0 (L) 39.0 - 52.0 %   MCV 71.8 (L) 80.0 - 100.0 fL   MCH 20.4 (L) 26.0 - 34.0 pg   MCHC 28.4 (L) 30.0 - 36.0 g/dL   RDW 54.0 (H) 98.1 - 19.1 %   Platelets 249 150 - 400 K/uL    Comment: REPEATED TO VERIFY   nRBC 0.0 0.0 - 0.2 %   Neutrophils Relative % 64 %   Neutro Abs 5.3 1.7 - 7.7 K/uL   Lymphocytes Relative 13 %   Lymphs Abs 1.0 0.7 - 4.0 K/uL   Monocytes Relative 14 %   Monocytes Absolute 1.1 (H) 0.1 - 1.0 K/uL   Eosinophils Relative 7 %   Eosinophils Absolute 0.5 0.0 - 0.5 K/uL   Basophils Relative 1 %   Basophils Absolute 0.1 0.0 - 0.1 K/uL   WBC Morphology MORPHOLOGY UNREMARKABLE    RBC Morphology See Note     Comment: ANISOCYTOSIS   Smear Review MORPHOLOGY UNREMARKABLE     Comment: PLATELET COUNT CONFIRMED BY SMEAR   Immature Granulocytes 1 %   Abs Immature Granulocytes 0.04 0.00 - 0.07 K/uL   Acanthocytes PRESENT    Schistocytes PRESENT    Polychromasia PRESENT    Target Cells PRESENT    Ovalocytes PRESENT     Comment: Performed at [facility], [address], [place], [place] [address]  Ethanol     Status: None   Collection Time: 05/05/23  8:04 AM  Result Value Ref Range   Alcohol, Ethyl (B) <10 <10 mg/dL    Comment: (NOTE) Lowest detectable limit for serum alcohol is 10 mg/dL.  For medical purposes only. Performed at [facility], [address] [place], [place] [address]   Sedimentation rate     Status: Abnormal   Collection Time: 05/05/23  8:04 AM  Result Value Ref Range   Sed Rate 74 (H) 0 - 16 mm/hr    Comment: Performed at [facility], [address], [place], [place] [address]   C-reactive protein     Status: Abnormal   Collection Time: 05/05/23  8:04 AM  Result Value Ref Range   CRP 9.9 (H) <1.0 mg/dL    Comment: Performed at [facility], [address] [place], [place] [address]  Magnesium     Status: None   Collection Time: 05/05/23  8:04 AM  Result Value Ref Range   Magnesium 2.4 1.7 - 2.4 mg/dL    Comment: Performed at [facility], [address] [place], [place] [address]  Phosphorus     Status: Abnormal   Collection Time: 05/05/23  8:04 AM  Result Value Ref Range   Phosphorus 6.8 (H) 2.5 - 4.6 mg/dL    Comment: Performed at [facility], [address] [place], [place] [address]  Resp panel by RT-PCR (RSV, Flu A&B, Covid) Anterior Nasal Swab     Status: None   Collection Time: 05/05/23  9:35 AM   Specimen: Anterior Nasal Swab  Result Value Ref Range   SARS Coronavirus 2 by RT PCR NEGATIVE NEGATIVE    Comment: (NOTE) SARS-CoV-2 target nucleic acids are NOT DETECTED.  The SARS-CoV-2  RNA is generally detectable in upper respiratory specimens during the acute phase of infection. The lowest concentration of SARS-CoV-2 viral copies this assay can detect is 138 copies/mL. A negative result does not preclude SARS-Cov-2 infection and should not be used as the sole basis for treatment or other patient management decisions. A negative result may occur with  improper specimen collection/handling, submission of specimen other than nasopharyngeal swab, presence of viral mutation(s) within the areas targeted by this assay, and inadequate number of viral copies(<138 copies/mL). A negative result must be combined with clinical observations, patient history, and epidemiological information. The expected result is Negative.  Fact Sheet for Patients:  BloggerCourse.com  Fact Sheet for Healthcare Providers:  SeriousBroker.it  This test is no t yet approved or cleared by the [place] FDA and  has  been authorized for detection and/or diagnosis of SARS-CoV-2 by FDA under an Emergency Use Authorization (EUA). This EUA will remain  in effect (meaning this test can be used) for the duration of the COVID-19 declaration under Section 564(b)(1) of the Act, 21 U.S.C.section 360bbb-3(b)(1), unless the authorization is terminated  or revoked sooner.       Influenza A by PCR NEGATIVE NEGATIVE   Influenza B by PCR NEGATIVE NEGATIVE    Comment: (NOTE) The Xpert Xpress SARS-CoV-2/FLU/RSV plus assay is intended as an aid in the diagnosis of influenza from Nasopharyngeal swab specimens and should not be used as a sole basis for treatment. Nasal washings and aspirates are unacceptable for Xpert Xpress SARS-CoV-2/FLU/RSV testing.  Fact Sheet for Patients: BloggerCourse.com  Fact Sheet for Healthcare Providers: SeriousBroker.it  This test is not yet approved or cleared by the [place] FDA and has been authorized for detection and/or diagnosis of SARS-CoV-2 by FDA under an Emergency Use Authorization (EUA). This EUA will remain in effect (meaning this test can be used) for the duration of the COVID-19 declaration under Section 564(b)(1) of the Act, 21 U.S.C. section 360bbb-3(b)(1), unless the authorization is terminated or revoked.     Resp Syncytial Virus by PCR NEGATIVE NEGATIVE    Comment: (NOTE) Fact Sheet for Patients: BloggerCourse.com  Fact Sheet for Healthcare Providers: SeriousBroker.it  This test is not yet approved or cleared by the [place] FDA and has been authorized for detection and/or diagnosis of SARS-CoV-2 by FDA under an Emergency Use Authorization (EUA). This EUA will remain in effect (meaning this test can be used) for the duration of the COVID-19 declaration under Section 564(b)(1) of the Act, 21 U.S.C. section 360bbb-3(b)(1), unless the authorization is  terminated or revoked.  Performed at [facility], [address] [place], [place] [address]   Protime-INR     Status: None   Collection Time: 05/05/23 11:24 AM  Result Value Ref Range   Prothrombin Time 15.0 11.4 - 15.2 seconds   INR 1.2 0.8 - 1.2    Comment: (NOTE) INR goal varies based on device and disease states. Performed at [facility], [address] [place], [place] [address]   Prepare RBC (crossmatch)     Status: None   Collection Time: 05/05/23 11:24 AM  Result Value Ref Range   Order Confirmation      ORDER PROCESSED BY BLOOD BANK Performed at [facility], [address] [place], [place] [address]   Type and screen [facility]     Status: None (Preliminary result)   Collection Time: 05/05/23 11:24 AM  Result Value Ref Range   ABO/RH(D) A POS    Antibody Screen PENDING  Sample Expiration      05/08/2023,2359 Performed at [facility], [address], [place], [place] [address]   Hemoglobin and hematocrit, blood     Status: Abnormal   Collection Time: 05/05/23 11:24 AM  Result Value Ref Range   Hemoglobin 8.7 (L) 13.0 - 17.0 g/dL   HCT 46.9 (L) 62.9 - 52.8 %    Comment: Performed at [facility], [address] [place], [place] [address]   DG Knee 1-2 Views Right Result Date: 05/05/2023 CLINICAL DATA:  [id number] Pain [id number]. Soreness in the region of the amputation. EXAM: RIGHT KNEE - 1-2 VIEW COMPARISON:  None Available. FINDINGS: Status post right above-knee amputation. The surgical margin is sharp. No focal bony erosions. There is thinning of the overlying stump especially on the cross-table view with probable small area of focal ulcer. Correlate with physical examination. No evidence of air within the stump. No acute fracture. No radiopaque foreign bodies. IMPRESSION: *Status post right above-knee amputation. No focal bony erosions. Thinning of the overlying stump with probable small area of focal ulcer. Correlate with physical examination. Electronically  Signed   By: Jules Schick M.D.   On: 05/05/2023 10:36   DG Chest Portable 1 View Result Date: 05/05/2023 CLINICAL DATA:  Shortness of breath. EXAM: PORTABLE CHEST 1 VIEW COMPARISON:  10/08/2022. FINDINGS: There is large right pleural effusion causing shift of mediastinal structures to the left. Mild aeration of the right upper lung zone noted. Left lung and left lateral costophrenic angle are clear. Evaluation of cardiomediastinal silhouette is nondiagnostic due to right lower hemithorax opacification. No acute osseous abnormalities. The soft tissues are within normal limits. IMPRESSION: *Large right pleural effusion causing shift of mediastinal structures to the left. Electronically Signed   By: Jules Schick M.D.   On: 05/05/2023 08:14    Pending Labs Unresulted Labs (From admission, onward)     Start     Ordered   05/06/23 0500  Basic metabolic panel  Daily,   R      05/05/23 1023   05/06/23 0500  CBC  Daily,   R      05/05/23 1023   05/06/23 0500  Protime-INR  Tomorrow morning,   R        05/05/23 1023   05/06/23 0500  APTT  Tomorrow morning,   R        05/05/23 1023   05/05/23 1201  Hemoglobin A1c  Once,   R       Comments: To assess prior glycemic control    05/05/23 1200   05/05/23 1056  Levetiracetam level  Add-on,   AD        05/05/23 1055   05/05/23 1023  Expectorated Sputum Assessment w Gram Stain, Rflx to Resp Cult  Once,   R       Comments: If productive cough    05/05/23 1023   05/05/23 0841  Blood culture (routine x 2)  BLOOD CULTURE X 2,   R (with STAT occurrences)      05/05/23 0840   05/05/23 0730  Urinalysis, Routine w reflex microscopic -Urine, Clean Catch  Once,   URGENT       Question:  Specimen Source  Answer:  Urine, Clean Catch   05/05/23 0729   05/05/23 0730  Rapid urine drug screen (hospital performed)  Once,   STAT        05/05/23 0729            Vitals/Pain Today's Vitals  05/05/23 1030 05/05/23 1100 05/05/23 1130 05/05/23 1225  BP: (!)  188/99 (!) 208/90 (!) 186/88   Pulse: 62 63 63   Resp: 13 14 13    Temp:    97.7 F (36.5 C)  TempSrc:    Oral  SpO2: 99% 100% 100%   Weight:      Height:      PainSc:        Isolation Precautions Enteric precautions (UV disinfection)  Medications Medications  heparin injection 5,000 Units (has no administration in time range)  sodium chloride flush (NS) 0.9 % injection 3 mL (3 mLs Intravenous Given 05/05/23 1130)  acetaminophen (TYLENOL) tablet 650 mg (has no administration in time range)    Or  acetaminophen (TYLENOL) suppository 650 mg (has no administration in time range)  oxyCODONE (Oxy IR/ROXICODONE) immediate release tablet 5 mg (has no administration in time range)  HYDROmorphone (DILAUDID) injection 0.5-1 mg (has no administration in time range)  zolpidem (AMBIEN) tablet 5 mg (has no administration in time range)  senna-docusate (Senokot-S) tablet 1 tablet (has no administration in time range)  bisacodyl (DULCOLAX) EC tablet 5 mg (has no administration in time range)  sodium phosphate (FLEET) enema 1 enema (has no administration in time range)  ondansetron (ZOFRAN) tablet 4 mg (has no administration in time range)    Or  ondansetron (ZOFRAN) injection 4 mg (has no administration in time range)  ipratropium (ATROVENT) nebulizer solution 0.5 mg (has no administration in time range)  levalbuterol (XOPENEX) nebulizer solution 0.63 mg (has no administration in time range)  hydrALAZINE (APRESOLINE) injection 10 mg (has no administration in time range)  sodium chloride flush (NS) 0.9 % injection 3-10 mL (has no administration in time range)  aspirin EC tablet 81 mg (81 mg Oral Given 05/05/23 1221)  atorvastatin (LIPITOR) tablet 80 mg (has no administration in time range)  carvedilol (COREG) tablet 12.5 mg (has no administration in time range)  calcitRIOL (ROCALTROL) capsule 2.25 mcg (has no administration in time range)  cinacalcet (SENSIPAR) tablet 60 mg (has no administration  in time range)  calcium acetate (PHOSLO) capsule 1,334 mg (has no administration in time range)  docusate sodium (COLACE) capsule 100 mg (has no administration in time range)  levETIRAcetam (KEPPRA) tablet 500 mg (has no administration in time range)  levETIRAcetam (KEPPRA) tablet 750 mg (750 mg Oral Given 05/05/23 1221)  multivitamin (RENA-VIT) tablet 1 tablet (has no administration in time range)  dorzolamide-timolol (COSOPT) 2-0.5 % ophthalmic solution 1 drop (has no administration in time range)  hydrocortisone (ANUSOL-HC) 2.5 % rectal cream 1 Application (has no administration in time range)  lidocaine-prilocaine (EMLA) cream 1 Application (has no administration in time range)  amiodarone (PACERONE) tablet 100 mg (has no administration in time range)  insulin aspart (novoLOG) injection 0-6 Units (has no administration in time range)  levETIRAcetam (KEPPRA) IVPB 1500 mg/ 100 mL premix (0 mg Intravenous Stopped 05/05/23 0859)  sodium zirconium cyclosilicate (LOKELMA) packet 10 g (10 g Oral Given 05/05/23 0939)  0.9 %  sodium chloride infusion (Manually program via Guardrails IV Fluids) ( Intravenous New Bag/Given 05/05/23 1225)    Mobility non-ambulatory        R Recommendations: See Admitting Provider Note  Report given to: [place]

## 2023-05-05 NOTE — Telephone Encounter (Signed)
 Message left on triage phone by patients wife stating that patient is currently in [facility] and that he had been transported by EMS there, after having "seizure activity" during dialysis.  She stated on VM she is afraid her husband has become septic and wanted to know if Dr [name] could amputate her husbands leg? She would like to be called back at [phone]

## 2023-05-05 NOTE — Hospital Course (Addendum)
 [name]. Is a 53 year old male from [place] rehab with extensive history of ESRD (on HD - TTS), right BKA, CVA, A-fib -not any anticoagulant, DM2, seizures on Keppra-presented to the ED with seizure-like activity during hemodialysis today.  Patient reports he has been compliant with his medication,, was complaining of some shortness of breath for past 2 days when laying down, but did not have any chest pain.  Had some mild nonproductive cough.  Recent had a below amputation by [name].  Patient and wife concerned if the stump may be infected-also has been advised in the past stump may be revised. Denies of having for fever chills, with no report of extensive drainage from the stump.   ED course/evaluation; Blood pressure (!) 164/88, pulse 63, temperature 98 F (36.7 C), temperature source Oral, height 6\' 3"  (1.905 m), weight 79.8 kg, SpO2 94%.  Labs: CBC: Hemoglobin 7.1, hematocrit 25.0,, CMP; sodium 137, potassium 5.4, BUN 53, creatinine 8.93, calcium 9.1, magnesium 2.4, albumin 2.3, AST 13, GFR 6,  Viral panel-all negative Blood cultures obtained  Right knee x-ray: *Status post right above-knee amputation. No focal bony erosions. Thinning of the overlying stump with probable small area of focal ulcer.   [name]: Dr. Rolly Pancake discussed with Dr. Due to with a complicated right knee amputation for reevaluation and revision-requested patient to be admitted to [facility] and he will follow. [name] also discussed PCCM Dr. Imogene Burn patient has a large right pleural effusion that needed thoracentesis-I risk he has agreed to evaluate the patient at: 4 thoracentesis  Patient will be admitted to [facility] for [name] evaluation, PCCM Dr. Imogene Burn evaluation.   Consults nephrology IR/nephrology/Ortho/PCCM /neurology [name], Dr. Due to, Dr. Johny Drilling, Dr. Melynda Ripple

## 2023-05-05 NOTE — Assessment & Plan Note (Addendum)
-  Recent BKA of the right lower extremity -Pursuing with imaging, to rule out osteomyelitis -[name] discussed with [name] who will see the patient on arrival to [facility] -Withholding antibiotics as patient is afebrile, no leukocytosis, no signs of infection -X-ray of the right stump did not reveal any signs of osteomyelitis *Status post right above-knee amputation. No focal bony erosions. Thinning of the overlying stump with probable small area of focal ulcer.    Right lower extremity BKA wound:

## 2023-05-05 NOTE — ED Provider Notes (Signed)
 [place] EMERGENCY DEPARTMENT AT [facility] Provider Note   CSN: [id number] Arrival date & time: 05/05/23  0710     History  Chief Complaint  Patient presents with   Seizures   Sore    [name] Salif Tay. is a 53 y.o. male.  53 year old male with a history of ESRD on HD TTS, right BKA, CVA, A-fib not on AC, DM, and seizures on Keppra who presents emergency department from dialysis due to seizures.  History obtained per patient and EMS but was apparently at dialysis and had 2 seizures.  Patient reports [name] has been compliant with his medication.  Per chart review looks like his amputation stump may be infected.  Patient also says [name] has had some shortness of breath with laying down for the past few days as well.  Also has had a cough.  Did talk to [name] who thinks the stump may need to be revised.  [name] is unsure of any increased drainage from the stump.  No fevers recently. Currently being treated for c diff as well.        Home Medications Prior to Admission medications   Medication Sig Start Date End Date Taking? Authorizing Provider  acetaminophen (TYLENOL) 325 MG tablet Take 1-2 tablets (325-650 mg total) by mouth every 4 (four) hours as needed for mild pain. 05/04/21   Love, Evlyn Kanner, PA-C  amiodarone (PACERONE) 200 MG tablet Take 1 tablet (200 mg total) by mouth every morning. 08/02/22 08/03/23  Corky Crafts, MD  aspirin EC 81 MG tablet Take 1 tablet (81 mg total) by mouth daily. Swallow whole. 04/12/23   Rai, Delene Ruffini, MD  atorvastatin (LIPITOR) 80 MG tablet TAKE ONE TABLET BY MOUTH AT BEDTIME. 03/22/23   Gaston Islam., NP  calcitRIOL (ROCALTROL) 0.25 MCG capsule Take 9 capsules (2.25 mcg total) by mouth every Tuesday, Thursday, and Saturday at 6 PM. 02/24/23   Danford, Earl Lites, MD  calcium acetate (PHOSLO) 667 MG capsule Take 2 capsules (1,334 mg total) by mouth 3 (three) times daily with meals. 05/04/21   Love, Evlyn Kanner, PA-C  carvedilol (COREG)  12.5 MG tablet Take 1 tablet (12.5 mg total) by mouth 2 (two) times daily. 02/23/23   Alberteen Sam, MD  cinacalcet (SENSIPAR) 30 MG tablet Take 2 tablets (60 mg total) by mouth every Tuesday, Thursday, and Saturday at 6 PM. 02/24/23   Danford, Earl Lites, MD  docusate sodium (COLACE) 100 MG capsule Take 1 capsule (100 mg total) by mouth 2 (two) times daily. 04/12/23 04/11/24  Rai, Ripudeep K, MD  dorzolamide-timolol (COSOPT) 2-0.5 % ophthalmic solution Place 1 drop into the right eye 2 (two) times daily. 11/15/22 11/15/23  [provider]  hydrocortisone (ANUSOL-HC) 2.5 % rectal cream Place 1 Application rectally 2 (two) times daily. 10/05/22   Leath-Warren, Sadie Haber, NP  levETIRAcetam (KEPPRA) 500 MG tablet Take 1 tablet (500 mg total) by mouth 4 (four) times a week. On Sunday, Monday, Wednesday and Friday, all non-dialysis days 02/25/23   Alberteen Sam, MD  levETIRAcetam (KEPPRA) 750 MG tablet Take 1 tablet (750 mg total) by mouth 3 (three) times a week. On Tuesday Thursday and Saturday, on dialysis days 02/24/23   Alberteen Sam, MD  lidocaine-prilocaine (EMLA) cream Apply 1 Application topically 3 (three) times a week. 12/24/21   [provider]  loperamide (IMODIUM) 2 MG capsule Take 1 capsule (2 mg total) by mouth as needed for diarrhea or loose stools.  02/23/23   Danford, Earl Lites, MD  midodrine (PROAMATINE) 5 MG tablet Take 1 tablet (5 mg total) by mouth 3 (three) times daily with meals. 02/23/23   Danford, Earl Lites, MD  multivitamin (RENA-VIT) TABS tablet Take 1 tablet by mouth at bedtime. 04/12/23   Rai, Delene Ruffini, MD  nutrition supplement, JUVEN, (JUVEN) PACK Take 1 packet by mouth 2 (two) times daily between meals. 02/23/23   Danford, Earl Lites, MD  Nutritional Supplements (,FEEDING SUPPLEMENT, PROSOURCE PLUS) liquid Take 30 mLs by mouth 3 (three) times daily between meals. 02/23/23   Danford, Earl Lites, MD  ondansetron (ZOFRAN) 4 MG tablet Take  1 tablet (4 mg total) by mouth every 6 (six) hours as needed for nausea or vomiting. 03/17/23   Al Decant, PA-C  polyethylene glycol (MIRALAX / GLYCOLAX) 17 g packet Take 17 g by mouth daily. 04/12/23   Cathren Harsh, MD      Allergies    Promethazine    Review of Systems   Review of Systems  Physical Exam Updated Vital Signs BP (!) 164/88 (BP Location: Right Arm)   Pulse 63   Temp 98 F (36.7 C) (Oral)   Ht 6\' 3"  (1.905 m)   Wt 79.8 kg   SpO2 94%   BMI 22.00 kg/m  Physical Exam Vitals and nursing note reviewed.  Constitutional:      General: [name] is not in acute distress.    Appearance: [name] is well-developed.     Comments: Alert and oriented x 3  HENT:     Head: Normocephalic and atraumatic.     Right Ear: External ear normal.     Left Ear: External ear normal.     Nose: Nose normal.  Eyes:     Extraocular Movements: Extraocular movements intact.     Conjunctiva/sclera: Conjunctivae normal.     Comments: Missing left eye.  Right pupil 4 mm  Cardiovascular:     Rate and Rhythm: Normal rate and regular rhythm.     Heart sounds: Normal heart sounds.  Pulmonary:     Effort: Pulmonary effort is normal. No respiratory distress.     Comments: Satting well on room air.  No respiratory distress.  Diminished lung sounds on the right side. Musculoskeletal:     Cervical back: Normal range of motion and neck supple.     Right lower leg: No edema.     Comments: Right lower extremity BKA.  See below for wound appearance.  Skin:    General: Skin is warm and dry.  Neurological:     Mental Status: [name] is alert and oriented to person, place, and time. Mental status is at baseline.     Cranial Nerves: No cranial nerve deficit.     Sensory: No sensory deficit.     Motor: No weakness.  Psychiatric:        Mood and Affect: Mood normal.        Behavior: Behavior normal.    Right lower extremity BKA wound:   ED Results / Procedures / Treatments   Labs (all labs ordered  are listed, but only abnormal results are displayed) Labs Reviewed  COMPREHENSIVE METABOLIC PANEL - Abnormal; Notable for the following components:      Result Value   Potassium 5.4 (*)    CO2 20 (*)    BUN 53 (*)    Creatinine, Ser 8.93 (*)    Albumin 2.3 (*)    AST 13 (*)    GFR,  Estimated 6 (*)    All other components within normal limits  CBC WITH DIFFERENTIAL/PLATELET - Abnormal; Notable for the following components:   RBC 3.48 (*)    Hemoglobin 7.1 (*)    HCT 25.0 (*)    MCV 71.8 (*)    MCH 20.4 (*)    MCHC 28.4 (*)    RDW 21.5 (*)    Monocytes Absolute 1.1 (*)    All other components within normal limits  SEDIMENTATION RATE - Abnormal; Notable for the following components:   Sed Rate 74 (*)    All other components within normal limits  RESP PANEL BY RT-PCR (RSV, FLU A&B, COVID)  [id number]  CULTURE, BLOOD (ROUTINE X 2)  CULTURE, BLOOD (ROUTINE X 2)  ETHANOL  URINALYSIS, ROUTINE W REFLEX MICROSCOPIC  RAPID URINE DRUG SCREEN, HOSP PERFORMED  C-REACTIVE PROTEIN  CBG MONITORING, ED    EKG EKG Interpretation Date/Time:  Thursday May 05 2023 07:44:44 EDT Ventricular Rate:  62 PR Interval:  195 QRS Duration:  108 QT Interval:  462 QTC Calculation: 470 R Axis:   67  Text Interpretation: Sinus rhythm LVH with secondary repolarization abnormality Confirmed by Vonita Moss [phone]) on 05/05/2023 9:31:29 AM  Radiology DG Chest Portable 1 View Result Date: 05/05/2023 CLINICAL DATA:  Shortness of breath. EXAM: PORTABLE CHEST 1 VIEW COMPARISON:  10/08/2022. FINDINGS: There is large right pleural effusion causing shift of mediastinal structures to the left. Mild aeration of the right upper lung zone noted. Left lung and left lateral costophrenic angle are clear. Evaluation of cardiomediastinal silhouette is nondiagnostic due to right lower hemithorax opacification. No acute osseous abnormalities. The soft tissues are within normal limits. IMPRESSION: *Large right pleural  effusion causing shift of mediastinal structures to the left. Electronically Signed   By: Jules Schick M.D.   On: 05/05/2023 08:14    Procedures Procedures    Medications Ordered in ED Medications  levETIRAcetam (KEPPRA) IVPB 1500 mg/ 100 mL premix (0 mg Intravenous Stopped 05/05/23 0859)  sodium zirconium cyclosilicate (LOKELMA) packet 10 g (10 g Oral Given 05/05/23 6045)    ED Course/ Medical Decision Making/ A&P Clinical Course as of 05/05/23 1018  Thu May 05, 2023  0853 Dr Merrily Pew from ICU recommends transfer to [facility] and having the hospitalist consult him for thoracentesis. [RP]  4098 [name] notified.  Says that [name] gets back in town on Monday and will see the patient at that point in time. [RP]  1010 Dr Flossie Dibble from hospitalist to admit the patient.  [RP]    Clinical Course User Index [RP] Rondel Baton, MD                                 Medical Decision Making Amount and/or Complexity of Data Reviewed Labs: ordered. Radiology: ordered.  Risk Prescription drug management. Decision regarding hospitalization.   [name] Deandra Gadson. is a 53 y.o. male with comorbidities that complicate the patient evaluation including ESRD on HD TTS, right BKA, CVA, A-fib not on AC, DM, and seizures on Keppra who presents emergency department from dialysis due to seizures.   Initial Ddx:  Volume overload, electrolyte abnormality, pneumonia, BKA dehiscence, postoperative infection, osteomyelitis, URI, seizure, structural brain abnormality  MDM/Course:  Patient presents to the emergency department with multiple complaints.  First it appears that [name] had a seizure at dialysis.  [name] is alert and oriented x 3 with a nonfocal neuroexam at this  time.  Has known seizures and is already on Keppra.  Will go ahead and load him with Keppra.  Also is complaining of shortness of breath for several days.  Obtained a COVID and flu in case of URI.  However, his chest x-ray did show a large  right-sided pleural effusion with mediastinal shift.  [name] is currently satting well on room air.  [name] is hemodynamically stable.  Was discussed with pulmonology who will perform thoracentesis when admitted.  Labs show that [name] has mild hyperkalemia with potassium of 5.4.  His stump does appear dehisced.  Does not appear grossly infected to me but would benefit from orthopedics evaluation.  [name] notified and [name] says that [name] is out of town until Monday but will see the patient then.  Discussed with hospitalist for admission.  Upon re-evaluation patient remained stable.  Since [name] is not septic we will hold off on antibiotics in case [name] needs a culture from the thoracentesis or bone biopsy.  This patient presents to the ED for concern of complaints listed in HPI, this involves an extensive number of treatment options, and is a complaint that carries with it a high risk of complications and morbidity. Disposition including potential need for admission considered.   Dispo: Admit to Floor  Additional history obtained from spouse Records reviewed Outpatient Clinic Notes The following labs were independently interpreted: Chemistry and show  ckd I independently reviewed the following imaging with scope of interpretation limited to determining acute life threatening conditions related to emergency care: Chest x-ray and agree with the radiologist interpretation with the following exceptions: none I personally reviewed and interpreted cardiac monitoring: normal sinus rhythm  I personally reviewed and interpreted the pt's EKG: see above for interpretation  I have reviewed the patients home medications and made adjustments as needed Consults: Hospitalist and pulmonology  Portions of this note were generated with Scientist, clinical (histocompatibility and immunogenetics). Dictation errors may occur despite best attempts at proofreading.     Final Clinical Impression(s) / ED Diagnoses Final diagnoses:  Seizure (HCC)  Pleural effusion   Hyperkalemia    Rx / DC Orders ED Discharge Orders     None         Rondel Baton, MD 05/05/23 1018

## 2023-05-05 NOTE — Consult Note (Signed)
 NAME:  [name] Withem., MRN:  [id number], DOB:  [birth year], LOS: 0 ADMISSION DATE:  05/05/2023, CONSULTATION DATE:  05/05/2023 REFERRING MD:  hospitalist, CHIEF COMPLAINT:  pleural effusion    History of Present Illness:  53 year old male with past medical history of seizures, STEMI now with HFpEF, hypertension, hyperlipidemia, GERD, gastroparesis, end-stage renal disease on hemodialysis Tuesday, Thursday, Saturday, stroke, atrial fibrillation not on anticoagulation, right BKA, diabetes who presented to the emergency department today for seizure-like activity during dialysis.  Noted in the emergency department has been having shortness of breath over the past few days, worse when laying flat.  Has associated cough.  Denying chest pain or palpitations.  He did not have fevers.  Notably he has a right BKA that needs to be revised by [name] to and may be infected.  Additionally is being treated for C. difficile.  Labs in ED notable for Na+ 137, 5.4, bicarb 20, BUN 53, sCr 8.93, WBC 8.1, hgb 7.1, ESR 74, CRP 9.9. Blood cultures pending. COVID/flu/RSV negative. He had chest x-ray showing a large right pleural effusion. ED consulted ICU regarding his pleural effusion and patient was transferred to [facility] for IR thoracentesis. Pulmonary consulted for help in management.   Pertinent  Medical History  seizures, STEMI now with HFpEF, hypertension, hyperlipidemia, GERD, gastroparesis, end-stage renal disease on hemodialysis Tuesday, Thursday, Saturday, stroke, atrial fibrillation not on anticoagulation, right BKA, diabetes  Significant Hospital Events: Including procedures, antibiotic start and stop dates in addition to other pertinent events   3/13: ED for seizure like activity during dialysis. Sob x2 days. Large R pleural effusion. IR for thoracentesis.   Interim History / Subjective:  Patient is awake, alert and oriented. Sitting in bed on room air. He states he has had cough for at least 3 weeks now  that is non-productive. He denies infectious symptoms. He does endorse aspiration of food and coughing when he takes medications. Denies environmental exposures. Never smoker. No chest surgeries. Does not wear oxygen at baseline. No history of COPD or asthma.   Objective   Blood pressure (!) 190/84, pulse 64, temperature 98.1 F (36.7 C), temperature source Oral, resp. rate 13, height 6\' 3"  (1.905 m), weight 79.8 kg, SpO2 100%.        Intake/Output Summary (Last 24 hours) at 05/05/2023 1459 Last data filed at 05/05/2023 0859 Gross per 24 hour  Intake 96.46 ml  Output --  Net 96.46 ml   Filed Weights   05/05/23 0729  Weight: 79.8 kg    Examination: General: middle aged male, sitting in bed, no acute distress  HENT: [place]/at, anicteric right sclera, left enucleated, room air  Lungs: diminished right lower lobe, clear left, no rhonchi, wheezing Cardiovascular: s1s2, no mrg Abdomen: rounded and soft Extremities: no edema  Neuro: alert, oriented, non focal  GU: defer   Resolved Hospital Problem list    Assessment & Plan:  Large right pleural effusion s/p IR thoracentesis  2-3 weeks of shortness of breath. No fevers. No leukocytosis. Sounds like he may have chronic aspiration issues that have lead to this. Large right pleural effusion s/p IR thoracentesis. However, post x-ray looks about the same.  - stat CT chest wo contrast  - start unasyn for possible aspiration pna  - will order formal barium swallow evaluation  - depending on CT findings may just need CT placement 3/14   - f/u pleural fluid studies - f/u RVP 20 pathogen panel, sputum culture  - aggressive pulm toileting, IS, flutter  valve - prn xopenex and atrovent   Below per primary Recent right BKA wound  Seizures  Anemia of chronic disease  Diabetes  ESRD on HD  HFpEF  CAD  HTN HLD  Best Practice (right click and "Reselect all SmartList Selections" daily)  Per primary  Diet/type:  DVT prophylaxis:  Pressure  ulcer(s).  GI prophylaxis:  Lines:  Foley:   Code Status:   Last date of multidisciplinary goals of care discussion []   Labs   CBC: Recent Labs  Lab 05/05/23 0804 05/05/23 1124  WBC 8.1  --   NEUTROABS 5.3  --   HGB 7.1* 8.7*  HCT 25.0* 30.8*  MCV 71.8*  --   PLT 249  --     Basic Metabolic Panel: Recent Labs  Lab 05/05/23 0804  NA 137  K 5.4*  CL 103  CO2 20*  GLUCOSE 92  BUN 53*  CREATININE 8.93*  CALCIUM 9.1  MG 2.4  PHOS 6.8*   GFR: Estimated Creatinine Clearance: 10.5 mL/min (A) (by C-G formula based on SCr of 8.93 mg/dL (H)). Recent Labs  Lab 05/05/23 0804  WBC 8.1    Liver Function Tests: Recent Labs  Lab 05/05/23 0804  AST 13*  ALT 10  ALKPHOS 58  BILITOT 0.6  PROT 6.5  ALBUMIN 2.3*   No results for input(s): "LIPASE", "AMYLASE" in the last 168 hours. No results for input(s): "AMMONIA" in the last 168 hours.  ABG    Component Value Date/Time   HCO3 29.5 (H) 06/23/2015 1954   TCO2 27 04/06/2023 1433   O2SAT 83.0 06/23/2015 1954     Coagulation Profile: Recent Labs  Lab 05/05/23 1124  INR 1.2    Cardiac Enzymes: No results for input(s): "CKTOTAL", "CKMB", "CKMBINDEX", "TROPONINI" in the last 168 hours.  HbA1C: Hemoglobin A1C  Date/Time Value Ref Range Status  08/30/2019 12:00 AM 5.2  Final  12/26/2018 12:00 AM 5.9  Final   Hgb A1c MFr Bld  Date/Time Value Ref Range Status  11/21/2021 11:05 AM 4.5 (L) 4.8 - 5.6 % Final    Comment:    (NOTE) Pre diabetes:          5.7%-6.4%  Diabetes:              >6.4%  Glycemic control for   <7.0% adults with diabetes   04/21/2021 10:04 AM 4.7 (L) 4.8 - 5.6 % Final    Comment:    (NOTE) Pre diabetes:          5.7%-6.4%  Diabetes:              >6.4%  Glycemic control for   <7.0% adults with diabetes     CBG: Recent Labs  Lab 05/05/23 1349  GLUCAP 78    Review of Systems:   As above  Past Medical History:  He,  has a past medical history of Acute Bil  CVAs of Bil  Frontal amd Rt Parietal  in Watershed Distribution (04/21/2021), Acute bilateral cerebral infarction in a watershed distribution [facility]) (04/21/2021), Acute cerebral infarction (HCC) (02/13/2021), Acute cerebral infarction (HCC) (04/21/2021), Acute osteomyelitis of toe of right foot with gangrene (HCC) (02/26/2022), Anaphylactic reaction due to adverse effect of correct drug or medicament properly administered, initial encounter (05/08/2019), Anemia of chronic renal failure, Anxiety, Aortic dilatation (HCC) (11/25/2020), Atrial fibrillation (HCC), Benign prostatic hyperplasia with lower urinary tract symptoms (11/07/2019), BPH (benign prostatic hyperplasia), Cardiac arrest (HCC) (07/26/2021), Cellulitis, Cerebral hemorrhage (HCC) (11/21/2021), Cerebral microvascular disease, Chronic diarrhea, Chronic  heart failure with preserved ejection fraction (HFpEF) (HCC) (07/19/2017), Chronic pain of both ankles, Coronary artery disease (02/26/2020), DDD (degenerative disc disease), cervical, Diabetes mellitus, type II (HCC), Diabetic neuropathy (HCC), Esophagitis, ESRD (end stage renal disease) on dialysis [facility]), Frequent falls, Gait instability, Gastritis, Gastroparesis, GERD (gastroesophageal reflux disease), H/O enucleation of left eyeball, Heart palpitations, History of kidney stones, HLD (hyperlipidemia), Hypertension, Insomnia, Long term current use of amiodarone, Nausea and vomiting in adult, NSTEMI (non-ST elevated myocardial infarction) (HCC) (07/26/2021), NVG (neovascular glaucoma), left, indeterminate stage (07/05/2019), Osteomyelitis (HCC), Pericardial effusion, Seizure (HCC), and ST elevation myocardial infarction (STEMI) of anterior wall (HCC) (02/26/2020).   Surgical History:   Past Surgical History:  Procedure Laterality Date   A/V FISTULAGRAM Left 08/31/2021   Procedure: A/V Fistulagram;  Surgeon: Annice Needy, MD;  Location: [name] INVASIVE CV LAB;  Service: Cardiovascular;  Laterality: Left;   A/V  FISTULAGRAM Left 01/25/2022   Procedure: A/V Fistulagram;  Surgeon: Annice Needy, MD;  Location: [name] INVASIVE CV LAB;  Service: Cardiovascular;  Laterality: Left;   A/V FISTULAGRAM Right 10/18/2022   Procedure: A/V Fistulagram;  Surgeon: Annice Needy, MD;  Location: [name] INVASIVE CV LAB;  Service: Cardiovascular;  Laterality: Right;   ABDOMINAL AORTOGRAM W/LOWER EXTREMITY Bilateral 02/08/2023   Procedure: ABDOMINAL AORTOGRAM W/LOWER EXTREMITY;  Surgeon: Daria Pastures, MD;  Location: [facility] INVASIVE CV LAB;  Service: Vascular;  Laterality: Bilateral;   AMPUTATION Left 03/16/2016   Procedure: AMPUTATION DIGIT LEFT HALLUX;  Surgeon: Vivi Barrack, DPM;  Location: [facility] OR;  Service: Podiatry;  Laterality: Left;  can start around 5    AMPUTATION Right 02/09/2021   Procedure: AMPUTATION DIGIT;  Surgeon: Marlyne Beards, MD;  Location: [facility] OR;  Service: Orthopedics;  Laterality: Right;   AMPUTATION Right 02/04/2023   Procedure: AMPUTATION 4TH AND 5TH METATARSAL;  Surgeon: Pilar Plate, DPM;  Location: [facility] OR;  Service: Orthopedics/Podiatry;  Laterality: Right;  I&D, partial ray resection, abx beads   AMPUTATION Right 02/09/2023   Procedure: RIGHT BELOW KNEE AMPUTATION;  Surgeon: [name], MD;  Location: [facility] OR;  Service: Orthopedics;  Laterality: Right;   AMPUTATION Right 04/06/2023   Procedure: RIGHT ABOVE KNEE AMPUTATION;  Surgeon: [name], MD;  Location: [facility] OR;  Service: Orthopedics;  Laterality: Right;   AMPUTATION TOE Right 02/28/2022   Procedure: AMPUTATION TOE;  Surgeon: Louann Sjogren, DPM;  Location: [name] ORS;  Service: Podiatry;  Laterality: Right;  right fifth toe   ARTHROSCOPIC REPAIR ACL Left    AV FISTULA PLACEMENT Right 05/31/2019   Procedure: Brachiocephalic AV fistula creation;  Surgeon: Annice Needy, MD;  Location: [name] ORS;  Service: Vascular;  Laterality: Right;   AV FISTULA PLACEMENT Left 08/13/2021   Procedure: ARTERIOVENOUS (AV) FISTULA CREATION  (BRACHIALCEPHALIC);  Surgeon: Annice Needy, MD;  Location: [name] ORS;  Service: Vascular;  Laterality: Left;   COLONOSCOPY WITH PROPOFOL N/A 10/28/2015   Procedure: COLONOSCOPY WITH PROPOFOL;  Surgeon: Christena Deem, MD;  Location: [facility] ENDOSCOPY;  Service: Endoscopy;  Laterality: N/A;   COLONOSCOPY WITH PROPOFOL N/A 10/29/2015   Procedure: COLONOSCOPY WITH PROPOFOL;  Surgeon: Christena Deem, MD;  Location: [facility] ENDOSCOPY;  Service: Endoscopy;  Laterality: N/A;   COLONOSCOPY WITH PROPOFOL N/A 01/27/2023   Procedure: COLONOSCOPY WITH PROPOFOL;  Surgeon: Franky Macho, MD;  Location: [facility]O [facility];  Service: Endoscopy;  Laterality: N/A;  12:30pm, asa 3   CORONARY ULTRASOUND/IVUS N/A 02/26/2020   Procedure: Intravascular Ultrasound/IVUS;  Surgeon: Corky Crafts, MD;  Location: [facility] INVASIVE CV LAB;  Service: Cardiovascular;  Laterality: N/A;   CORONARY/GRAFT ACUTE MI REVASCULARIZATION N/A 02/26/2020   Procedure: Coronary/Graft Acute MI Revascularization;  Surgeon: Corky Crafts, MD;  Location: [facility] INVASIVE CV LAB;  Service: Cardiovascular;  Laterality: N/A;   DIALYSIS/PERMA CATHETER INSERTION Right 04/26/2019   Perm Cath    DIALYSIS/PERMA CATHETER INSERTION N/A 04/26/2019   Procedure: DIALYSIS/PERMA CATHETER INSERTION;  Surgeon: Annice Needy, MD;  Location: [name] INVASIVE CV LAB;  Service: Cardiovascular;  Laterality: N/A;   DIALYSIS/PERMA CATHETER INSERTION N/A 05/25/2021   Procedure: DIALYSIS/PERMA CATHETER INSERTION;  Surgeon: Annice Needy, MD;  Location: [name] INVASIVE CV LAB;  Service: Cardiovascular;  Laterality: N/A;   DIALYSIS/PERMA CATHETER INSERTION N/A 08/31/2021   Procedure: DIALYSIS/PERMA CATHETER INSERTION;  Surgeon: Annice Needy, MD;  Location: [name] INVASIVE CV LAB;  Service: Cardiovascular;  Laterality: N/A;   DIALYSIS/PERMA CATHETER REMOVAL N/A 08/15/2019   Procedure: DIALYSIS/PERMA CATHETER REMOVAL;  Surgeon: Renford Dills, MD;  Location: [name] INVASIVE CV LAB;   Service: Cardiovascular;  Laterality: N/A;   DIALYSIS/PERMA CATHETER REMOVAL N/A 03/15/2022   Procedure: DIALYSIS/PERMA CATHETER REMOVAL;  Surgeon: Annice Needy, MD;  Location: [name] INVASIVE CV LAB;  Service: Cardiovascular;  Laterality: N/A;   EMBOLIZATION (CATH LAB) Right 02/06/2021   Procedure: EMBOLIZATION;  Surgeon: Annice Needy, MD;  Location: [name] INVASIVE CV LAB;  Service: Cardiovascular;  Laterality: Right;  Right Upper Extremity Dialysis Access, Permcath Placement   ESOPHAGOGASTRODUODENOSCOPY (EGD) WITH PROPOFOL N/A 12/27/2017   Procedure: ESOPHAGOGASTRODUODENOSCOPY (EGD) WITH PROPOFOL;  Surgeon: Toledo, Boykin Nearing, MD;  Location: [name] ENDOSCOPY;  Service: Gastroenterology;  Laterality: N/A;   EYE SURGERY     LEFT HEART CATH AND CORONARY ANGIOGRAPHY N/A 02/26/2020   Procedure: LEFT HEART CATH AND CORONARY ANGIOGRAPHY;  Surgeon: Corky Crafts, MD;  Location: [facility] INVASIVE CV LAB;  Service: Cardiovascular;  Laterality: N/A;   PROSTATE SURGERY  2016   REVISON OF ARTERIOVENOUS FISTULA Right 07/22/2022   Procedure: RESECTION OF ANEURYSMAL RIGHT ARM ARTERIOVENOUS FISTULA;  Surgeon: Annice Needy, MD;  Location: [name] ORS;  Service: Vascular;  Laterality: Right;   TONSILECTOMY/ADENOIDECTOMY WITH MYRINGOTOMY     TONSILLECTOMY     UPPER EXTREMITY ANGIOGRAPHY Right 11/26/2020   Procedure: Upper Extremity Angiography;  Surgeon: Annice Needy, MD;  Location: [name] INVASIVE CV LAB;  Service: Cardiovascular;  Laterality: Right;   UPPER EXTREMITY ANGIOGRAPHY Right 02/05/2021   Procedure: UPPER EXTREMITY ANGIOGRAPHY;  Surgeon: Annice Needy, MD;  Location: [name] INVASIVE CV LAB;  Service: Cardiovascular;  Laterality: Right;     Social History:   reports that he has never smoked. He has never used smokeless tobacco. He reports that he does not drink alcohol and does not use drugs.   Family History:  His family history includes CAD in his father; Diabetes Mellitus II in his mother; Kidney failure in  his mother; Schizophrenia in his mother; Stroke in his father.   Allergies Allergies  Allergen Reactions   Promethazine Diarrhea and Other (See Comments)    Muscle cramps, cramping "all over"     Home Medications  Prior to Admission medications   Medication Sig Start Date End Date Taking? Authorizing Provider  acetaminophen (TYLENOL) 325 MG tablet Take 1-2 tablets (325-650 mg total) by mouth every 4 (four) hours as needed for mild pain. 05/04/21  Yes Love, Evlyn Kanner, PA-C  amiodarone (PACERONE) 200 MG tablet Take 1 tablet (200 mg total) by mouth every morning. 08/02/22 08/03/23 Yes Corky Crafts,  MD  ascorbic acid (VITAMIN C) 500 MG tablet Take 1,000 mg by mouth daily.   Yes [provider]  aspirin EC 81 MG tablet Take 1 tablet (81 mg total) by mouth daily. Swallow whole. 04/12/23  Yes Rai, Ripudeep K, MD  atorvastatin (LIPITOR) 80 MG tablet TAKE ONE TABLET BY MOUTH AT BEDTIME. 03/22/23  Yes Gaston Islam., NP  calcitRIOL (ROCALTROL) 0.25 MCG capsule Take 9 capsules (2.25 mcg total) by mouth every Tuesday, Thursday, and Saturday at 6 PM. 02/24/23  Yes Danford, Earl Lites, MD  calcium acetate (PHOSLO) 667 MG capsule Take 2 capsules (1,334 mg total) by mouth 3 (three) times daily with meals. 05/04/21  Yes Love, Evlyn Kanner, PA-C  carvedilol (COREG) 12.5 MG tablet Take 1 tablet (12.5 mg total) by mouth 2 (two) times daily. 02/23/23  Yes Danford, Earl Lites, MD  cinacalcet (SENSIPAR) 30 MG tablet Take 2 tablets (60 mg total) by mouth every Tuesday, Thursday, and Saturday at 6 PM. 02/24/23  Yes Danford, Earl Lites, MD  docusate sodium (COLACE) 100 MG capsule Take 1 capsule (100 mg total) by mouth 2 (two) times daily. Patient taking differently: Take 200 mg by mouth 2 (two) times daily. 04/12/23 04/11/24 Yes Rai, Ripudeep K, MD  dorzolamide-timolol (COSOPT) 2-0.5 % ophthalmic solution Place 1 drop into the right eye 2 (two) times daily. 11/15/22 11/15/23 Yes [provider]   doxycycline (ADOXA) 100 MG tablet Take 100 mg by mouth 2 (two) times daily.   Yes [provider]  levETIRAcetam (KEPPRA) 500 MG tablet Take 1 tablet (500 mg total) by mouth 4 (four) times a week. On Sunday, Monday, Wednesday and Friday, all non-dialysis days 02/25/23  Yes Danford, Earl Lites, MD  levETIRAcetam (KEPPRA) 750 MG tablet Take 1 tablet (750 mg total) by mouth 3 (three) times a week. On Tuesday Thursday and Saturday, on dialysis days 02/24/23  Yes Danford, Earl Lites, MD  lidocaine-prilocaine (EMLA) cream Apply 1 Application topically 3 (three) times a week. 12/24/21  Yes [provider]  loperamide (IMODIUM) 2 MG capsule Take 1 capsule (2 mg total) by mouth as needed for diarrhea or loose stools. 02/23/23  Yes Danford, Earl Lites, MD  midodrine (PROAMATINE) 5 MG tablet Take 1 tablet (5 mg total) by mouth 3 (three) times daily with meals. 02/23/23  Yes Danford, Earl Lites, MD  multivitamin (RENA-VIT) TABS tablet Take 1 tablet by mouth at bedtime. 04/12/23  Yes Rai, Ripudeep K, MD  nutrition supplement, JUVEN, (JUVEN) PACK Take 1 packet by mouth 2 (two) times daily between meals. 02/23/23  Yes Danford, Earl Lites, MD  Nutritional Supplements (,FEEDING SUPPLEMENT, PROSOURCE PLUS) liquid Take 30 mLs by mouth 3 (three) times daily between meals. 02/23/23  Yes Danford, Earl Lites, MD  ondansetron (ZOFRAN) 4 MG tablet Take 1 tablet (4 mg total) by mouth every 6 (six) hours as needed for nausea or vomiting. 03/17/23  Yes Al Decant, PA-C  polyethylene glycol (MIRALAX / GLYCOLAX) 17 g packet Take 17 g by mouth daily. 04/12/23  Yes Rai, Ripudeep K, MD  vancomycin (VANCOCIN) 125 MG capsule Take 125 mg by mouth 4 (four) times daily.   Yes [provider]  zinc sulfate, 50mg  elemental zinc, 220 (50 Zn) MG capsule Take 220 mg by mouth daily.   Yes [provider]     Critical care time: na    Lenard Galloway [facility] 05/05/23 3:15 PM  Please see [email or web address] for  pager details.  From 7A-7P if no response, please call [phone] After hours, please call ELink [phone]

## 2023-05-06 ENCOUNTER — Encounter (HOSPITAL_COMMUNITY): Admission: EM | Disposition: A | Discharge status: Home Health | Payer: Self-pay | Attending: Internal Medicine

## 2023-05-06 ENCOUNTER — Inpatient Hospital Stay (HOSPITAL_COMMUNITY)

## 2023-05-06 ENCOUNTER — Other Ambulatory Visit (HOSPITAL_COMMUNITY)

## 2023-05-06 DIAGNOSIS — J90 Pleural effusion, not elsewhere classified: Secondary | ICD-10-CM | POA: Diagnosis not present

## 2023-05-06 LAB — BASIC METABOLIC PANEL
Anion gap: 11 (ref 5–15)
BUN: 61 mg/dL — ABNORMAL HIGH (ref 6–20)
CO2: 19 mmol/L — ABNORMAL LOW (ref 22–32)
Calcium: 8.6 mg/dL — ABNORMAL LOW (ref 8.9–10.3)
Chloride: 105 mmol/L (ref 98–111)
Creatinine, Ser: 10.1 mg/dL — ABNORMAL HIGH (ref 0.61–1.24)
GFR, Estimated: 6 mL/min — ABNORMAL LOW (ref >60)
Glucose, Bld: 75 mg/dL (ref 70–99)
Potassium: 5.8 mmol/L — ABNORMAL HIGH (ref 3.5–5.1)
Sodium: 135 mmol/L (ref 135–145)

## 2023-05-06 LAB — CBC
HCT: 23.7 % — ABNORMAL LOW (ref 39.0–52.0)
HCT: 23 % — ABNORMAL LOW (ref 39.0–52.0)
Hemoglobin: 7.1 g/dL — ABNORMAL LOW (ref 13.0–17.0)
Hemoglobin: 6.8 g/dL — CL (ref 13.0–17.0)
MCH: 21.2 pg — ABNORMAL LOW (ref 26.0–34.0)
MCH: 21.3 pg — ABNORMAL LOW (ref 26.0–34.0)
MCHC: 30 g/dL (ref 30.0–36.0)
MCHC: 29.6 g/dL — ABNORMAL LOW (ref 30.0–36.0)
MCV: 70.7 fL — ABNORMAL LOW (ref 80.0–100.0)
MCV: 71.9 fL — ABNORMAL LOW (ref 80.0–100.0)
Platelets: 232 10*3/uL (ref 150–400)
Platelets: 237 10*3/uL (ref 150–400)
RBC: 3.35 MIL/uL — ABNORMAL LOW (ref 4.22–5.81)
RBC: 3.2 MIL/uL — ABNORMAL LOW (ref 4.22–5.81)
RDW: 21.1 % — ABNORMAL HIGH (ref 11.5–15.5)
RDW: 21.1 % — ABNORMAL HIGH (ref 11.5–15.5)
WBC: 7.6 10*3/uL (ref 4.0–10.5)
WBC: 7.3 10*3/uL (ref 4.0–10.5)
nRBC: 0 % (ref 0.0–0.2)
nRBC: 0 % (ref 0.0–0.2)

## 2023-05-06 LAB — HEPATITIS B SURFACE ANTIBODY, QUANTITATIVE: Hep B S AB Quant (Post): 19.9 m[IU]/mL

## 2023-05-06 LAB — RENAL FUNCTION PANEL
Albumin: 2 g/dL — ABNORMAL LOW (ref 3.5–5.0)
Anion gap: 11 (ref 5–15)
BUN: 61 mg/dL — ABNORMAL HIGH (ref 6–20)
CO2: 21 mmol/L — ABNORMAL LOW (ref 22–32)
Calcium: 8.8 mg/dL — ABNORMAL LOW (ref 8.9–10.3)
Chloride: 107 mmol/L (ref 98–111)
Creatinine, Ser: 10.95 mg/dL — ABNORMAL HIGH (ref 0.61–1.24)
GFR, Estimated: 5 mL/min — ABNORMAL LOW (ref >60)
Glucose, Bld: 72 mg/dL (ref 70–99)
Phosphorus: 8.1 mg/dL — ABNORMAL HIGH (ref 2.5–4.6)
Potassium: 6 mmol/L — ABNORMAL HIGH (ref 3.5–5.1)
Sodium: 139 mmol/L (ref 135–145)

## 2023-05-06 LAB — HEMOGLOBIN AND HEMATOCRIT, BLOOD
HCT: 26.9 % — ABNORMAL LOW (ref 39.0–52.0)
Hemoglobin: 8.3 g/dL — ABNORMAL LOW (ref 13.0–17.0)

## 2023-05-06 LAB — PROTIME-INR
INR: 1.2 (ref 0.8–1.2)
Prothrombin Time: 15.8 s — ABNORMAL HIGH (ref 11.4–15.2)

## 2023-05-06 LAB — LEVETIRACETAM LEVEL: Levetiracetam Lvl: 45.5 ug/mL — ABNORMAL HIGH (ref 10.0–40.0)

## 2023-05-06 LAB — GLUCOSE, CAPILLARY: Glucose-Capillary: 74 mg/dL (ref 70–99)

## 2023-05-06 LAB — PATHOLOGIST SMEAR REVIEW

## 2023-05-06 LAB — PREPARE RBC (CROSSMATCH)

## 2023-05-06 LAB — APTT: aPTT: 40 s — ABNORMAL HIGH (ref 24–36)

## 2023-05-06 SURGERY — CHEST TUBE INSERTION
Anesthesia: LOCAL | Laterality: Right

## 2023-05-06 MED ORDER — ZINC SULFATE 220 (50 ZN) MG PO CAPS
220.0000 mg | ORAL_CAPSULE | Freq: Every day | ORAL | Status: DC
  Administered 2023-05-07 – 2023-05-11 (×5): 220 mg via ORAL
  Filled 2023-05-06 (×5): qty 1

## 2023-05-06 MED ORDER — LIDOCAINE HCL (PF) 1 % IJ SOLN: 5.0000 mL | INTRAMUSCULAR | Status: DC | PRN

## 2023-05-06 MED ORDER — LIDOCAINE-PRILOCAINE 2.5-2.5 % EX CREA: 1.0000 | TOPICAL_CREAM | CUTANEOUS | Status: DC | PRN

## 2023-05-06 MED ORDER — SODIUM CHLORIDE 0.9% FLUSH
10.0000 mL | Freq: Three times a day (TID) | INTRAVENOUS | Status: DC
  Administered 2023-05-06 – 2023-05-12 (×11): 10 mL via INTRAPLEURAL

## 2023-05-06 MED ORDER — CLONAZEPAM 0.5 MG PO TBDP
0.5000 mg | ORAL_TABLET | Freq: Two times a day (BID) | ORAL | Status: AC
  Administered 2023-05-06 – 2023-05-08 (×6): 0.5 mg via ORAL
  Filled 2023-05-06 (×6): qty 1

## 2023-05-06 MED ORDER — HEPARIN SODIUM (PORCINE) 1000 UNIT/ML DIALYSIS: 1000.0000 [IU] | INTRAMUSCULAR | Status: DC | PRN

## 2023-05-06 MED ORDER — CHLORHEXIDINE GLUCONATE CLOTH 2 % EX PADS
6.0000 | MEDICATED_PAD | Freq: Every day | CUTANEOUS | Status: DC
  Administered 2023-05-06: 6 via TOPICAL

## 2023-05-06 MED ORDER — SODIUM CHLORIDE 0.9% IV SOLUTION: Freq: Once | INTRAVENOUS | Status: DC

## 2023-05-06 MED ORDER — PENTAFLUOROPROP-TETRAFLUOROETH EX AERO: 1.0000 | INHALATION_SPRAY | CUTANEOUS | Status: DC | PRN

## 2023-05-06 MED ORDER — ANTICOAGULANT SODIUM CITRATE 4% (200MG/5ML) IV SOLN: 5.0000 mL | Status: DC | PRN

## 2023-05-06 MED ORDER — LIDOCAINE HCL (PF) 1 % IJ SOLN
INTRAMUSCULAR | Status: AC
  Filled 2023-05-06: qty 30

## 2023-05-06 MED ORDER — VITAMIN C 500 MG PO TABS
1000.0000 mg | ORAL_TABLET | Freq: Every day | ORAL | Status: DC
  Administered 2023-05-06 – 2023-05-17 (×12): 1000 mg via ORAL
  Filled 2023-05-06 (×12): qty 2

## 2023-05-06 MED ORDER — ALTEPLASE 2 MG IJ SOLR: 2.0000 mg | Freq: Once | INTRAMUSCULAR | Status: DC | PRN

## 2023-05-06 NOTE — Progress Notes (Signed)
 PT Cancellation Note  Patient Details Name: [name]. MRN: [id number] DOB: May 25, 1970   Cancelled Treatment:    Reason Eval/Treat Not Completed: Patient at procedure or test/unavailable; will follow up as schedule allows and pt available.   Elray Mcgregor 05/06/2023, 9:09 AM Sheran Lawless, PT Acute Rehabilitation Services Office:[phone] 05/06/2023

## 2023-05-06 NOTE — Progress Notes (Addendum)
 SLP Cancellation Note  Patient Details Name: [name]. MRN: [id number] DOB: 02-25-1970   Cancelled treatment:       Reason Eval/Treat Not Completed: Patient declined to participate in MBS after HD and chest tube placement this date. Education was provided regarding need for evaluation with pt continuing to decline. SLP will f/u subsequent date to complete MBS.    Gwynneth Aliment, M.A., CF-SLP Speech Language Pathology, Acute Rehabilitation Services  Secure Chat preferred [phone]  05/06/2023, 3:52 PM

## 2023-05-06 NOTE — Procedures (Signed)
 Insertion of Chest Tube Procedure Note  Frazier Balfour  [id number]  12/28/1970  Date:05/06/23  Time:2:45 PM    Provider Performing: Lorin Glass   Procedure: Pleural Catheter Insertion w/ Imaging Guidance (40981)  Indication(s) Effusion  Consent Risks of the procedure as well as the alternatives and risks of each were explained to the patient and/or caregiver.  Consent for the procedure was obtained and is signed in the bedside chart  Anesthesia Topical only with 1% lidocaine    Time Out Verified patient identification, verified procedure, site/side was marked, verified correct patient position, special equipment/implants available, medications/allergies/relevant history reviewed, required imaging and test results available.   Sterile Technique Maximal sterile technique including full sterile barrier drape, hand hygiene, sterile gown, sterile gloves, mask, hair covering, sterile ultrasound probe cover (if used).   Procedure Description Ultrasound used to identify appropriate pleural anatomy for placement and overlying skin marked. Area of placement cleaned and draped in sterile fashion.  A 14 French pigtail pleural catheter was placed into the right pleural space using Seldinger technique. Appropriate return of fluid was obtained.  The tube was connected to atrium and placed on -20 cm H2O wall suction.   Complications/Tolerance None; patient tolerated the procedure well. Chest X-ray is ordered to verify placement.   EBL Minimal  Specimen(s) fluid

## 2023-05-06 NOTE — Progress Notes (Signed)
 [name].  ZOX:[id number] DOB: 11-21-1970 DOA: 05/05/2023 PCP: [name], FNP    Brief Narrative:  53 year old resident of [facility] with a medical history of ESRD on HD TTS, recent right BKA ([name]), CVA, atrial fibrillation not on anticoagulation, DM2, and seizure disorder who presented to the [facility] with seizure-like activity which occurred during dialysis.  While in the ER the patient was discovered to have a large right pleural effusion.  Additionally thinning of skin of his right stump was noted with a small area of focal ulceration.  The decision was made to transfer him to [facility] where he could be seen by [name] for possible revision of his right BKA stump.  PCCM was consulted to consider a possible thoracentesis.  Goals of Care:   Code Status: Full Code   DVT prophylaxis: heparin injection 5,000 Units Start: 05/05/23 2200 TED hose Start: 05/05/23 1023 SCDs Start: 05/05/23 1023   Interim Hx: Afebrile.  Vital signs stable.  Hemoglobin has declined overnight and therefore patient will be transfused today.  The patient is seen in the endoscopy unit where he is waiting to have a right chest tube placed.  He is resting comfortably.  He appears remarkably to be in no distress.  He has no complaints.  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 - no evidence to suggest underlying osteomyelitis on plain films of the BKA site  Seizure disorder -suspected breakthrough seizure Neurology suggest continuing Keppra 500 mg daily with additional 250 mg on dialysis days after dialysis - clonazepam to be used at 0.5 mg twice daily for  3 days as a bridge while underlying infection being corrected  ESRD on HD TTS Care per Nephrology  Anemia of CKD Hemoglobin dropping - transfuse and monitor trend -no obvious source of blood loss  Chronic grade 2 diastolic congestive heart failure with EF 55-60% Volume management per dialysis  CAD status post DES to LAD 2022 Asymptomatic presently -continue usual beta-blocker statin and aspirin  Uncontrolled DM2 with hyperglycemia CBG improved since admission -monitor trend  HTN Blood pressure well-controlled at present  HLD Continue usual Lipitor  Family Communication: No family present at time of exam Disposition: Disposition unclear presently   Objective: Blood pressure (!) 100/54, pulse 78, temperature 98.4 F (36.9 C), temperature source Oral, resp. rate 20, height 6\' 3"  (1.905 m), weight 78 kg, SpO2 93%.  Intake/Output Summary (Last 24 hours) at 05/06/2023 1728 Last data filed at 05/06/2023 1231 Gross per 24 hour  Intake 193 ml  Output 2.5 ml  Net 190.5 ml   Filed Weights   05/06/23 0500 05/06/23 0906 05/06/23 1130  Weight: 79.8 kg 80 kg 78 kg    Examination: General: No acute respiratory distress Lungs: Minimal and distant breath sounds in right lung fields with good air movement on left with no wheezing Cardiovascular: Regular rate and rhythm without murmur gallop or rub normal S1 and S2 Abdomen: Nontender, nondistended, soft, bowel sounds positive, no rebound, no ascites, no appreciable mass Extremities: No significant cyanosis, clubbing, or edema bilateral  CBC: Recent Labs  Lab 05/05/23 0804 05/05/23 1124 05/06/23 0615 05/06/23 0904  WBC 8.1  --  7.6 7.3  NEUTROABS 5.3  --   --   --   HGB 7.1* 8.7* 7.1* 6.8*  HCT 25.0* 30.8* 23.7* 23.0*  MCV 71.8*  --  70.7* 71.9*  PLT 249  --  232 237   Basic Metabolic Panel: Recent Labs  Lab 05/05/23 0804 05/06/23 0615 05/06/23 0904  NA 137 135 139  K 5.4* 5.8* 6.0*  CL 103 105 107  CO2 20* 19* 21*   GLUCOSE 92 75 72  BUN 53* 61* 61*  CREATININE 8.93* 10.10* 10.95*  CALCIUM 9.1 8.6* 8.8*  MG 2.4  --   --   PHOS 6.8*  --  8.1*   GFR: Estimated Creatinine Clearance: 8.4 mL/min (A) (by C-G formula based on SCr of 10.95 mg/dL (H)).   Scheduled Meds:  sodium chloride   Intravenous Once   amiodarone  100 mg Oral BH-q7a   ascorbic acid  1,000 mg Oral Daily   aspirin EC  81 mg Oral Daily   atorvastatin  80 mg Oral QHS   calcitRIOL  2.25 mcg Oral Q T,Th,Sat-1800   calcium acetate  1,334 mg Oral TID WC   carvedilol  12.5 mg Oral BID   Chlorhexidine Gluconate Cloth  6 each Topical Q0600   cinacalcet  60 mg Oral Q T,Th,Sat-1800   clonazepam  0.5 mg Oral BID   docusate sodium  100 mg Oral BID   dorzolamide-timolol  1 drop Right Eye BID   heparin  5,000 Units Subcutaneous Q8H   hydrocortisone  1 Application Rectal BID   insulin aspart  0-6 Units Subcutaneous TID WC   levETIRAcetam  500 mg Oral Once per day on Sunday Monday Wednesday Friday   levETIRAcetam  750 mg Oral Once per day on Tuesday Thursday Saturday   lidocaine-prilocaine  1 Application Topical Once per day on Monday Wednesday Friday   multivitamin  1 tablet Oral QHS   sodium chloride flush  10 mL Intrapleural Q8H   sodium chloride flush  3 mL Intravenous Q12H   zinc sulfate (50mg  elemental zinc)  220 mg Oral Daily   Continuous Infusions:  ampicillin-sulbactam (UNASYN) IV 3 g (05/05/23 1751)     LOS: 1 day   Lonia Blood, MD [facility] Office  [phone] Pager - Text Page per Loretha Stapler  If 7PM-7AM, please contact night-coverage per Amion 05/06/2023, 5:28 PM

## 2023-05-06 NOTE — Progress Notes (Addendum)
 [facility] Progress Note   Subjective:   Pt seen on HD, sleeping but awakens briefly to voice. Denies SOB, CP, dizziness.   Objective Vitals:   05/06/23 0845 05/06/23 0900 05/06/23 0902 05/06/23 0906  BP:  (!) 162/70 (!) 168/72   Pulse:  62 62   Resp:  (!) 5 11   Temp: 98.4 F (36.9 C)     TempSrc: Axillary     SpO2:  99% 100%   Weight:    80 kg  Height:       Physical Exam General: Awakens to voice, NAD Heart: RRR, no murmurs, rubs or gallops Lungs: CTA anteriorly, respirations unlabored Abdomen: Soft, non-distended, +BS Extremities: No edema b/l lower extremities Dialysis Access: AVF accessed  Additional Objective Labs: Basic Metabolic Panel: Recent Labs  Lab 05/05/23 0804 05/06/23 0615  NA 137 135  K 5.4* 5.8*  CL 103 105  CO2 20* 19*  GLUCOSE 92 75  BUN 53* 61*  CREATININE 8.93* 10.10*  CALCIUM 9.1 8.6*  PHOS 6.8*  --    Liver Function Tests: Recent Labs  Lab 05/05/23 0804  AST 13*  ALT 10  ALKPHOS 58  BILITOT 0.6  PROT 6.5  ALBUMIN 2.3*   No results for input(s): "LIPASE", "AMYLASE" in the last 168 hours. CBC: Recent Labs  Lab 05/05/23 0804 05/05/23 1124 05/06/23 0615  WBC 8.1  --  7.6  NEUTROABS 5.3  --   --   HGB 7.1* 8.7* 7.1*  HCT 25.0* 30.8* 23.7*  MCV 71.8*  --  70.7*  PLT 249  --  232   Blood Culture    Component Value Date/Time   SDES FLUID PLEURAL RIGHT 05/05/2023 1443   SDES FLUID PLEURAL RIGHT 05/05/2023 1443   SPECREQUEST BOTTLES DRAWN AEROBIC AND ANAEROBIC 05/05/2023 1443   SPECREQUEST NONE 05/05/2023 1443   CULT  05/05/2023 1443    NO GROWTH < 24 HOURS Performed at [facility], [address] [place], [place] [address]    REPTSTATUS PENDING 05/05/2023 1443   REPTSTATUS 05/05/2023 FINAL 05/05/2023 1443    Cardiac Enzymes: No results for input(s): "CKTOTAL", "CKMB", "CKMBINDEX", "TROPONINI" in the last 168 hours. CBG: Recent Labs  Lab 05/05/23 1349 05/05/23 2053 05/06/23 0803  GLUCAP  78 106* 74   Iron Studies: No results for input(s): "IRON", "TIBC", "TRANSFERRIN", "FERRITIN" in the last 72 hours. @lablastinr3 @ Studies/Results: CT CHEST WO CONTRAST Result Date: 05/05/2023 CLINICAL DATA:  Pneumonia, complication  suspected EXAM: CT CHEST WITHOUT CONTRAST TECHNIQUE: Multidetector CT imaging of the chest was performed following the standard protocol without IV contrast. RADIATION DOSE REDUCTION: This exam was performed according to the departmental dose-optimization program which includes automated exposure control, adjustment of the mA and/or kV according to patient size and/or use of iterative reconstruction technique. COMPARISON:  11/21/2021 FINDINGS: Cardiovascular: Mild four-chamber cardiac enlargement. Small pericardial effusion, decreased from previous. Heavy mitral annulus calcifications. Extensive 3 vessel coronary calcifications. Aortic Atherosclerosis (ICD10-170.0). Mediastinum/Nodes: No definite mass or adenopathy. Lungs/Pleura: Moderately large right pleural effusion has developed. Extensive consolidation/atelectasis in the right lower lobe, and posterior right upper lobe. No pneumothorax. Minimal dependent atelectasis posteriorly at the left lung base. Upper Abdomen: Extensive vascular calcifications. No acute findings. Musculoskeletal: No chest wall mass or suspicious bone lesions identified. IMPRESSION: 1. Moderately large right pleural effusion with extensive right lower lobe and posterior right upper lobe consolidation/atelectasis. 2. Small pericardial effusion, decreased from previous. 3. Coronary and aortic Atherosclerosis (ICD10-I70.0). Electronically Signed   By: Algis Downs  Deanne Coffer M.D.   On: 05/05/2023 21:39   DG Chest 1 View Result Date: 05/05/2023 CLINICAL DATA:  Status post thoracentesis EXAM: CHEST  1 VIEW COMPARISON:  Chest x-ray 05/05/2023 FINDINGS: A very large right pleural effusion persists and has not significantly changed. The left lung is clear. There is no  evidence for pneumothorax. The heart is enlarged, unchanged. No acute fractures are seen. IMPRESSION: Stable large right pleural effusion. No evidence for pneumothorax. Electronically Signed   By: Darliss Cheney M.D.   On: 05/05/2023 17:39   IR THORACENTESIS ASP PLEURAL SPACE W/IMG GUIDE Result Date: 05/05/2023 INDICATION: 53 year old male with history of ESRD presents with shortness of breath. Previous imaging showed right pleural effusion. Request for therapeutic and diagnostic thoracentesis. EXAM: ULTRASOUND GUIDED RIGHT THORACENTESIS MEDICATIONS: Ancef 3 gm IV COMPLICATIONS: None immediate. PROCEDURE: An ultrasound guided thoracentesis was thoroughly discussed with the patient and questions answered. The benefits, risks, alternatives and complications were also discussed. The patient understands and wishes to proceed with the procedure. Written consent was obtained. Ultrasound was performed to localize and mark an adequate pocket of fluid in the right chest. The area was then prepped and draped in the normal sterile fashion. 1% Lidocaine was used for local anesthesia. Under ultrasound guidance a 6 Fr Safe-T-Centesis catheter was introduced. Thoracentesis was performed. The catheter was removed and a dressing applied. FINDINGS: A total of approximately 1.5 L of hazy amber fluid was removed. Samples were sent to the laboratory as requested by the clinical team. Post-procedure chest x-ray ordered, results pending. IMPRESSION: Successful ultrasound guided right thoracentesis yielding 1.5 L of pleural fluid. Performed by: Lawernce Ion, PA-C Electronically Signed   By: Gilmer Mor D.O.   On: 05/05/2023 16:00   DG Knee 1-2 Views Right Result Date: 05/05/2023 CLINICAL DATA:  [id number] Pain [id number]. Soreness in the region of the amputation. EXAM: RIGHT KNEE - 1-2 VIEW COMPARISON:  None Available. FINDINGS: Status post right above-knee amputation. The surgical margin is sharp. No focal bony erosions. There is thinning of  the overlying stump especially on the cross-table view with probable small area of focal ulcer. Correlate with physical examination. No evidence of air within the stump. No acute fracture. No radiopaque foreign bodies. IMPRESSION: *Status post right above-knee amputation. No focal bony erosions. Thinning of the overlying stump with probable small area of focal ulcer. Correlate with physical examination. Electronically Signed   By: Jules Schick M.D.   On: 05/05/2023 10:36   DG Chest Portable 1 View Result Date: 05/05/2023 CLINICAL DATA:  Shortness of breath. EXAM: PORTABLE CHEST 1 VIEW COMPARISON:  10/08/2022. FINDINGS: There is large right pleural effusion causing shift of mediastinal structures to the left. Mild aeration of the right upper lung zone noted. Left lung and left lateral costophrenic angle are clear. Evaluation of cardiomediastinal silhouette is nondiagnostic due to right lower hemithorax opacification. No acute osseous abnormalities. The soft tissues are within normal limits. IMPRESSION: *Large right pleural effusion causing shift of mediastinal structures to the left. Electronically Signed   By: Jules Schick M.D.   On: 05/05/2023 08:14   Medications:  ampicillin-sulbactam (UNASYN) IV 3 g (05/05/23 1751)   anticoagulant sodium citrate      amiodarone  100 mg Oral BH-q7a   aspirin EC  81 mg Oral Daily   atorvastatin  80 mg Oral QHS   calcitRIOL  2.25 mcg Oral Q T,Th,Sat-1800   calcium acetate  1,334 mg Oral TID WC   carvedilol  12.5 mg Oral BID  Chlorhexidine Gluconate Cloth  6 each Topical Q0600   cinacalcet  60 mg Oral Q T,Th,Sat-1800   clonazepam  0.5 mg Oral BID   docusate sodium  100 mg Oral BID   dorzolamide-timolol  1 drop Right Eye BID   heparin  5,000 Units Subcutaneous Q8H   hydrocortisone  1 Application Rectal BID   insulin aspart  0-6 Units Subcutaneous TID WC   levETIRAcetam  500 mg Oral Once per day on Sunday Monday Wednesday Friday   levETIRAcetam  750 mg Oral  Once per day on Tuesday Thursday Saturday   lidocaine-prilocaine  1 Application Topical Once per day on Monday Wednesday Friday   multivitamin  1 tablet Oral QHS   sodium chloride flush  3 mL Intravenous Q12H    Dialysis Orders: outpatient HD orders: [facility].  3 hours 15 minutes.  EDW: 77.5 kg (however likely well under this as per outpatient unit).  LUE AVF, 15-gauge needles.  Flow rates: 400/800.  37 degrees.  2K/2.5 calcium.  UF profile #2.  Heparin: Loading 1000 units, 400 units/h.  Meds: Mircera 100 mcg every 2 weeks (last dose 3/4)   Assessment/Plan: 1. R pleural effusion: S/p thoracentesis with IR on 3/13 2. ESRD: Typically on TTS schedule, short HD today then will plan for HD tomorrow to resume his regular schedule. Tolerating well so far.  3. HTN/volume:  BP elevated, keep EDW low as tolerated to prevent recurrent effusion.  4. Anemia: S/p 1 unit PRBC on 3/13. Hgb 7.1 today. Last mircera dose on 04/26/23.  5. Secondary hyperparathyroidism:  Calcium at goal, phos elevated. Continue calcium acetate, calcitriol and sensipar 6. Nutrition:  Alb low, continue protein supplements  Rogers Blocker, PA-C 05/06/2023, 9:48 AM  [facility] Pager: [phone]

## 2023-05-06 NOTE — Progress Notes (Signed)
 05/06/2023     I have seen and evaluated the patient for pleural effusion   S:  Seen post HD, denies complaints.   O: Blood pressure 96/66, pulse 64, temperature 97.6 F (36.4 C), temperature source Oral, resp. rate 17, height 5\' 3"  (1.6 m), weight 81.6 kg, SpO2 94 %.    No distress Absent breath sounds on R  CT chest reviewed   A:  New pleural effusion of unclear etiology Hx seizures with presumed breakthrough seizure C diff infection R BKA wound that may need revision by [name]   P:  Pigtail for effusion: need to send cytology -20 suction, okay to travel or walk around on waterseal PRN Remainder of workup per primary Will follow   Myrla Halsted MD [facility] Prefer epic messenger for cross cover needs If after hours, please call E-link

## 2023-05-06 NOTE — Progress Notes (Signed)
 OT Cancellation Note  Patient Details Name: [name]. MRN: [id number] DOB: April 06, 1970   Cancelled Treatment:    Reason Eval/Treat Not Completed: Patient at procedure or test/ unavailable. Will follow and see as able.  Barry Brunner, OT Acute Rehabilitation Services Office [phone] Secure Chat Preferred    Chancy Milroy 05/06/2023, 8:51 AM

## 2023-05-06 NOTE — Progress Notes (Signed)
 Received patient in bed. Awake,alert and oriented x 2.Patient is legally blind.He gave a verbal consent for his hd treatment as heard by other nurse.  Access used : Left avf that worked well.  Duration of treatment: 2 hours.  Net uf goal: 2.5 liters.  Hemo comment: PMD and a nurse made aware of patient's 6.8 hgb.                             Back into his room with stable medical condition via transporter.

## 2023-05-06 NOTE — Plan of Care (Signed)
 Neurology plan of care  Please see neurology consult from overnight for full findings and recommendations. Neurology to sign off, but please re-engage if additional neurologic concerns arise.  Bing Neighbors, MD [facility] [phone]  If 7pm- 7am, please page neurology on call as listed in AMION.

## 2023-05-07 ENCOUNTER — Inpatient Hospital Stay (HOSPITAL_COMMUNITY)

## 2023-05-07 DIAGNOSIS — J90 Pleural effusion, not elsewhere classified: Secondary | ICD-10-CM | POA: Diagnosis not present

## 2023-05-07 LAB — TYPE AND SCREEN
ABO/RH(D): A POS
Antibody Screen: NEGATIVE
Unit division: 0

## 2023-05-07 LAB — RENAL FUNCTION PANEL
Albumin: 1.9 g/dL — ABNORMAL LOW (ref 3.5–5.0)
Anion gap: 12 (ref 5–15)
BUN: 46 mg/dL — ABNORMAL HIGH (ref 6–20)
CO2: 24 mmol/L (ref 22–32)
Calcium: 8.6 mg/dL — ABNORMAL LOW (ref 8.9–10.3)
Chloride: 102 mmol/L (ref 98–111)
Creatinine, Ser: 8.43 mg/dL — ABNORMAL HIGH (ref 0.61–1.24)
GFR, Estimated: 7 mL/min — ABNORMAL LOW (ref >60)
Glucose, Bld: 71 mg/dL (ref 70–99)
Phosphorus: 7 mg/dL — ABNORMAL HIGH (ref 2.5–4.6)
Potassium: 5.3 mmol/L — ABNORMAL HIGH (ref 3.5–5.1)
Sodium: 138 mmol/L (ref 135–145)

## 2023-05-07 LAB — CBC
HCT: 25.4 % — ABNORMAL LOW (ref 39.0–52.0)
Hemoglobin: 7.8 g/dL — ABNORMAL LOW (ref 13.0–17.0)
MCH: 22.1 pg — ABNORMAL LOW (ref 26.0–34.0)
MCHC: 30.7 g/dL (ref 30.0–36.0)
MCV: 72 fL — ABNORMAL LOW (ref 80.0–100.0)
Platelets: 235 10*3/uL (ref 150–400)
RBC: 3.53 MIL/uL — ABNORMAL LOW (ref 4.22–5.81)
RDW: 21.1 % — ABNORMAL HIGH (ref 11.5–15.5)
WBC: 7.5 10*3/uL (ref 4.0–10.5)
nRBC: 0 % (ref 0.0–0.2)

## 2023-05-07 LAB — BPAM RBC
Blood Product Expiration Date: 202504072359
ISSUE DATE / TIME: 202503141642
Unit Type and Rh: 6200

## 2023-05-07 MED ORDER — SODIUM CHLORIDE (PF) 0.9 % IJ SOLN
10.0000 mg | Freq: Once | INTRAMUSCULAR | Status: AC
  Administered 2023-05-07: 10 mg via INTRAPLEURAL
  Filled 2023-05-07: qty 10

## 2023-05-07 MED ORDER — SODIUM CHLORIDE 0.9% IV SOLUTION: Freq: Once | INTRAVENOUS | Status: DC

## 2023-05-07 MED ORDER — STERILE WATER FOR INJECTION IJ SOLN
5.0000 mg | Freq: Once | RESPIRATORY_TRACT | Status: AC
  Administered 2023-05-07: 5 mg via INTRAPLEURAL
  Filled 2023-05-07: qty 5

## 2023-05-07 NOTE — Progress Notes (Signed)
 Patient refused morning dialysis per his nurse Melina Copa, RN.  Charge Nurse Scherrie Bateman RN in [facility] informed.  Derrell Lolling, KDU

## 2023-05-07 NOTE — Evaluation (Signed)
 Physical Therapy Evaluation Patient Details Name: [name]. MRN: [id number] DOB: 02-Feb-1971 Today's Date: 05/07/2023  History of Present Illness  53 year old admitted 3/13 found to have large right pleural effusion, recent right AKA on 04/06/2023 with new stump ulceration, suspected breakthrough seizure. Chest tube placed (right) 3/14. PMHx: ESRD on HD TTS, recent right BKA ([name]), CVA, atrial fibrillation not on anticoagulation, DM2, and seizure disorder.  Clinical Impression  Pt admitted with above complications. Initially lethargic but became more alert during session with mobility. Wife present, reports pt working with PT at SNF, able to sit with some assist, and is working on lateral scoot transfers to w/c; has not been able to fully stand yet but working towards this. Currently uses hoyer lift with nursing staff at SNF. Co-evaluated with OT for patient safety and to fully assess limitations. Required up to mod assist with bed mobility including rolling and transitions to EOB. Max assist for squat scoot along bed to reposition with Lt knee block. Tolerated lower extremity exercises well. Pt currently with functional limitations due to the deficits listed below (see PT Problem List). Pt will benefit from acute skilled PT to increase their independence and safety with mobility to allow discharge.           If plan is discharge home, recommend the following: Two people to help with walking and/or transfers;Two people to help with bathing/dressing/bathroom;Direct supervision/assist for medications management;Help with stairs or ramp for entrance;Assist for transportation;Assistance with cooking/housework;Direct supervision/assist for financial management;Supervision due to cognitive status   Can travel by private vehicle   No    Equipment Recommendations None recommended by PT  Recommendations for Other Services       Functional Status Assessment Patient has had a recent decline  in their functional status and demonstrates the ability to make significant improvements in function in a reasonable and predictable amount of time.     Precautions / Restrictions Precautions Precautions: Fall Recall of Precautions/Restrictions: Intact Precaution/Restrictions Comments: R AKA Restrictions Weight Bearing Restrictions Per Provider Order: No      Mobility  Bed Mobility Overal bed mobility: Needs Assistance Bed Mobility: Rolling, Sidelying to Sit, Sit to Sidelying Rolling: Used rails, Mod assist, +2 for physical assistance Sidelying to sit: Mod assist, Used rails, +2 for physical assistance, HOB elevated     Sit to sidelying: Min assist, +2 for physical assistance, +2 for safety/equipment General bed mobility comments: Mod assist +2 for rolling, progressed gradually to min assist. Practiced towards each side a couple of times while performing peri-care/hygiene and to sit up on EOB. Required cues for technique, multimodal. Uses rail with UEs. Also, assisted lightly by pushing through LLE in bridge fashion when assisting pt to scoot up in bed. Mod assist +2 to transition to seated position. Initially CGA but eventually needed min assist for balance. Min assist +2 to lower onto side and return to supine. Cues throughout for technique.    Transfers Overall transfer level: Needs assistance Equipment used: None               General transfer comment: Lt knee block squat and scoot along bed for better alignment. performed x2 with cues to lean forward, able to hold therapist's waist and push through LLE, max assist to lift buttocks from bed.    Ambulation/Gait               General Gait Details: Deferred due to weakness for safety.  Stairs  Wheelchair Mobility     Tilt Bed    Modified Rankin (Stroke Patients Only)       Balance Overall balance assessment: Needs assistance Sitting-balance support: Feet supported, Bilateral upper extremity  supported Sitting balance-Leahy Scale: Poor Sitting balance - Comments: Intermittent min assist Postural control: Posterior lean                                   Pertinent Vitals/Pain Pain Assessment Pain Assessment: Faces Faces Pain Scale: Hurts even more Pain Location: buttocks Pain Descriptors / Indicators: Sore, Throbbing, Grimacing, Guarding Pain Intervention(s): Limited activity within patient's tolerance, Monitored during session, Repositioned    Home Living Family/patient expects to be discharged to:: Skilled nursing facility Living Arrangements: Spouse/significant other Available Help at Discharge: Family;Available PRN/intermittently Type of Home: House Home Access: Ramped entrance       Home Layout: One level Home Equipment: Rollator (4 wheels);Rolling Walker (2 wheels);BSC/3in1      Prior Function Prior Level of Function : Needs assist           ADLs (physical): IADLs;Bathing;Dressing 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 Has been at SNF, progressing to standing with therapy but has not completed full stand yet. Staff use hoyer lift for transfers. Wife reports he can sit with assistance at EOB. ADLs Comments: Wife reports staff have been assisting patient with bathing/dressing.     Extremity/Trunk Assessment   Upper Extremity Assessment Upper Extremity Assessment: Defer to OT evaluation    Lower Extremity Assessment Lower Extremity Assessment: Generalized weakness;RLE deficits/detail;LLE deficits/detail RLE Deficits / Details: Rt AKA, wound exposed - RN notified via secure chat. Able to lift >50% against gravit with hip flexion RLE: Unable to fully assess due to pain LLE Deficits / Details: Gross weakness, 4-/5. Lt heel wrapped in gauze.       Communication   Communication Communication: No apparent difficulties    Cognition Arousal: Lethargic Behavior During Therapy: Flat affect                            PT - Cognition Comments: Delayed initiation, some trouble sequencing. Became more alert with mobility. Following commands: Intact Following commands impaired: Follows one step commands with increased time, Only follows one step commands consistently     Cueing Cueing Techniques: Verbal cues, Tactile cues     General Comments General comments (skin integrity, edema, etc.): Wife present, supportive    Exercises Amputee Exercises Quad Sets: Strengthening, Both, 10 reps, Supine Gluteal Sets: Strengthening, Both, 10 reps, Supine Straight Leg Raises: AAROM, Strengthening, Right, 5 reps, Supine   Assessment/Plan    PT Assessment Patient needs continued PT services  PT Problem List Decreased strength;Decreased activity tolerance;Decreased balance;Decreased mobility;Decreased range of motion;Decreased knowledge of use of DME;Decreased cognition;Decreased safety awareness;Decreased knowledge of precautions;Pain;Decreased skin integrity       PT Treatment Interventions DME instruction;Gait training;Functional mobility training;Therapeutic activities;Therapeutic exercise;Balance training;Patient/family education;Neuromuscular re-education;Cognitive remediation;Wheelchair mobility training;Modalities    PT Goals (Current goals can be found in the Care Plan section)  Acute Rehab PT Goals Patient Stated Goal: Improve strength, independence. PT Goal Formulation: With patient/family Time For Goal Achievement: 05/21/23 Potential to Achieve Goals: Fair    Frequency Min 2X/week     Co-evaluation PT/OT/SLP Co-Evaluation/Treatment: Yes Reason for Co-Treatment: To address functional/ADL transfers;Complexity of the patient's impairments (multi-system involvement);For patient/therapist safety PT goals addressed  during session: Mobility/safety with mobility;Balance;Strengthening/ROM         AM-PAC PT "6 Clicks" Mobility  Outcome Measure Help needed turning from your back  to your side while in a flat bed without using bedrails?: A Lot Help needed moving from lying on your back to sitting on the side of a flat bed without using bedrails?: A Lot Help needed moving to and from a bed to a chair (including a wheelchair)?: Total Help needed standing up from a chair using your arms (e.g., wheelchair or bedside chair)?: Total Help needed to walk in hospital room?: Total Help needed climbing 3-5 steps with a railing? : Total 6 Click Score: 8    End of Session Equipment Utilized During Treatment: Gait belt Activity Tolerance: Patient tolerated treatment well (Weakness and fatigue, limiting further progression but likely near baseline.) Patient left: with call bell/phone within reach;in bed;with bed alarm set;with family/visitor present Nurse Communication: Mobility status;Need for lift equipment;Precautions (open wound Rt AKA.) PT Visit Diagnosis: Muscle weakness (generalized) (M62.81);History of falling (Z91.81);Difficulty in walking, not elsewhere classified (R26.2);Pain Pain - part of body:  (buttocks)    Time: 1330-1404 PT Time Calculation (min) (ACUTE ONLY): 34 min   Charges:   PT Evaluation $PT Eval Moderate Complexity: 1 Mod   PT General Charges $$ ACUTE PT VISIT: 1 Visit         Kathlyn Sacramento, PT, DPT [facility]  Rehabilitation Services Physical Therapist Office: [phone] Website: [email or web address]   Berton Mount 05/07/2023, 2:37 PM

## 2023-05-07 NOTE — Progress Notes (Signed)
 [facility] Progress Note   Subjective:   Pt seen in room, refused HD this AM. Reports he is too tired and having some nausea. Feels he does not need HD since he had it yesterday. Discussed this was a short make up treatment, risk of being underdialyzed/hyperkalemic if he skips HD today. Advised we will check in again this afternoon. He denies SOB, CP, dizziness.   Objective Vitals:   05/06/23 1702 05/06/23 2023 05/07/23 0458 05/07/23 0833  BP: (!) 100/54 (!) 163/79 135/69 128/68  Pulse: 78 65 63 62  Resp: 20 19 17 19   Temp: 98.4 F (36.9 C) 98.5 F (36.9 C) 99.1 F (37.3 C) 98.4 F (36.9 C)  TempSrc: Oral Oral Oral Oral  SpO2: 93% 99% 96% 95%  Weight:      Height:       Physical Exam General: Awakens to voice, NAD Heart: RRR, no murmurs, rubs or gallops Lungs: CTA anteriorly, respirations unlabored Abdomen: Soft, non-distended, +BS Extremities: No edema b/l lower extremities Dialysis Access: AVF + bruit  Additional Objective Labs: Basic Metabolic Panel: Recent Labs  Lab 05/05/23 0804 05/06/23 0615 05/06/23 0904 05/07/23 0332  NA 137 135 139 138  K 5.4* 5.8* 6.0* 5.3*  CL 103 105 107 102  CO2 20* 19* 21* 24  GLUCOSE 92 75 72 71  BUN 53* 61* 61* 46*  CREATININE 8.93* 10.10* 10.95* 8.43*  CALCIUM 9.1 8.6* 8.8* 8.6*  PHOS 6.8*  --  8.1* 7.0*   Liver Function Tests: Recent Labs  Lab 05/05/23 0804 05/06/23 0904 05/07/23 0332  AST 13*  --   --   ALT 10  --   --   ALKPHOS 58  --   --   BILITOT 0.6  --   --   PROT 6.5  --   --   ALBUMIN 2.3* 2.0* 1.9*   No results for input(s): "LIPASE", "AMYLASE" in the last 168 hours. CBC: Recent Labs  Lab 05/05/23 0804 05/05/23 1124 05/06/23 0615 05/06/23 0904 05/06/23 2011 05/07/23 0332  WBC 8.1  --  7.6 7.3  --  7.5  NEUTROABS 5.3  --   --   --   --   --   HGB 7.1*   < > 7.1* 6.8* 8.3* 7.8*  HCT 25.0*   < > 23.7* 23.0* 26.9* 25.4*  MCV 71.8*  --  70.7* 71.9*  --  72.0*  PLT 249  --  232 237  --   235   < > = values in this interval not displayed.   Blood Culture    Component Value Date/Time   SDES FLUID PLEURAL RIGHT 05/05/2023 1443   SDES FLUID PLEURAL RIGHT 05/05/2023 1443   SPECREQUEST BOTTLES DRAWN AEROBIC AND ANAEROBIC 05/05/2023 1443   SPECREQUEST NONE 05/05/2023 1443   CULT  05/05/2023 1443    NO GROWTH 2 DAYS Performed at [facility], [address] [place], [place] [address]    REPTSTATUS PENDING 05/05/2023 1443   REPTSTATUS 05/05/2023 FINAL 05/05/2023 1443    Cardiac Enzymes: No results for input(s): "CKTOTAL", "CKMB", "CKMBINDEX", "TROPONINI" in the last 168 hours. CBG: Recent Labs  Lab 05/05/23 1349 05/05/23 2053 05/06/23 0803  GLUCAP 78 106* 74   Iron Studies: No results for input(s): "IRON", "TIBC", "TRANSFERRIN", "FERRITIN" in the last 72 hours. @lablastinr3 @ Studies/Results: DG Chest Port 1 View Result Date: 05/07/2023 CLINICAL DATA:  Evaluate chest tube placement EXAM: PORTABLE CHEST 1 VIEW COMPARISON:  05/06/2023 FINDINGS: Right basilar chest  tube is stable in position from previous exam. No pneumothorax identified. Stable cardiomediastinal contours. Small residual right pleural effusion appears stable to slightly increased in the interval. Left lung clear. IMPRESSION: 1. Stable position of right basilar chest tube. No pneumothorax. 2. Small residual right pleural effusion appears stable to slightly increased in the interval. Electronically Signed   By: Signa Kell M.D.   On: 05/07/2023 08:46   DG Chest Port 1 View Result Date: 05/06/2023 CLINICAL DATA:  Right chest tube, history of pleural effusion EXAM: PORTABLE CHEST 1 VIEW COMPARISON:  05/05/2023 FINDINGS: Single frontal view of the chest demonstrates near complete resolution of the right pleural effusion after placement of a right-sided pigtail drainage catheter, coiled over the right lung base. There is minimal residual consolidation at the right lung base, which may reflect atelectasis  or pneumonia. Left chest is clear. No pneumothorax. Cardiac silhouette is enlarged but stable. No acute bony abnormalities. IMPRESSION: 1. Near complete resolution of right pleural effusion after pigtail drainage catheter placement. 2. Minimal residual consolidation at the right lung base which may reflect atelectasis or pneumonia. 3. Stable enlarged cardiac silhouette. Electronically Signed   By: Sharlet Salina M.D.   On: 05/06/2023 16:24   CT CHEST WO CONTRAST Result Date: 05/05/2023 CLINICAL DATA:  Pneumonia, complication  suspected EXAM: CT CHEST WITHOUT CONTRAST TECHNIQUE: Multidetector CT imaging of the chest was performed following the standard protocol without IV contrast. RADIATION DOSE REDUCTION: This exam was performed according to the departmental dose-optimization program which includes automated exposure control, adjustment of the mA and/or kV according to patient size and/or use of iterative reconstruction technique. COMPARISON:  11/21/2021 FINDINGS: Cardiovascular: Mild four-chamber cardiac enlargement. Small pericardial effusion, decreased from previous. Heavy mitral annulus calcifications. Extensive 3 vessel coronary calcifications. Aortic Atherosclerosis (ICD10-170.0). Mediastinum/Nodes: No definite mass or adenopathy. Lungs/Pleura: Moderately large right pleural effusion has developed. Extensive consolidation/atelectasis in the right lower lobe, and posterior right upper lobe. No pneumothorax. Minimal dependent atelectasis posteriorly at the left lung base. Upper Abdomen: Extensive vascular calcifications. No acute findings. Musculoskeletal: No chest wall mass or suspicious bone lesions identified. IMPRESSION: 1. Moderately large right pleural effusion with extensive right lower lobe and posterior right upper lobe consolidation/atelectasis. 2. Small pericardial effusion, decreased from previous. 3. Coronary and aortic Atherosclerosis (ICD10-I70.0). Electronically Signed   By: Corlis Leak M.D.    On: 05/05/2023 21:39   DG Chest 1 View Result Date: 05/05/2023 CLINICAL DATA:  Status post thoracentesis EXAM: CHEST  1 VIEW COMPARISON:  Chest x-ray 05/05/2023 FINDINGS: A very large right pleural effusion persists and has not significantly changed. The left lung is clear. There is no evidence for pneumothorax. The heart is enlarged, unchanged. No acute fractures are seen. IMPRESSION: Stable large right pleural effusion. No evidence for pneumothorax. Electronically Signed   By: Darliss Cheney M.D.   On: 05/05/2023 17:39   IR THORACENTESIS ASP PLEURAL SPACE W/IMG GUIDE Result Date: 05/05/2023 INDICATION: 53 year old male with history of ESRD presents with shortness of breath. Previous imaging showed right pleural effusion. Request for therapeutic and diagnostic thoracentesis. EXAM: ULTRASOUND GUIDED RIGHT THORACENTESIS MEDICATIONS: Ancef 3 gm IV COMPLICATIONS: None immediate. PROCEDURE: An ultrasound guided thoracentesis was thoroughly discussed with the patient and questions answered. The benefits, risks, alternatives and complications were also discussed. The patient understands and wishes to proceed with the procedure. Written consent was obtained. Ultrasound was performed to localize and mark an adequate pocket of fluid in the right chest. The area was then prepped and draped in  the normal sterile fashion. 1% Lidocaine was used for local anesthesia. Under ultrasound guidance a 6 Fr Safe-T-Centesis catheter was introduced. Thoracentesis was performed. The catheter was removed and a dressing applied. FINDINGS: A total of approximately 1.5 L of hazy amber fluid was removed. Samples were sent to the laboratory as requested by the clinical team. Post-procedure chest x-ray ordered, results pending. IMPRESSION: Successful ultrasound guided right thoracentesis yielding 1.5 L of pleural fluid. Performed by: Lawernce Ion, PA-C Electronically Signed   By: Gilmer Mor D.O.   On: 05/05/2023 16:00   Medications:   ampicillin-sulbactam (UNASYN) IV 3 g (05/06/23 1855)    amiodarone  100 mg Oral BH-q7a   ascorbic acid  1,000 mg Oral Daily   aspirin EC  81 mg Oral Daily   atorvastatin  80 mg Oral QHS   calcitRIOL  2.25 mcg Oral Q T,Th,Sat-1800   calcium acetate  1,334 mg Oral TID WC   carvedilol  12.5 mg Oral BID   Chlorhexidine Gluconate Cloth  6 each Topical Q0600   cinacalcet  60 mg Oral Q T,Th,Sat-1800   clonazepam  0.5 mg Oral BID   docusate sodium  100 mg Oral BID   dorzolamide-timolol  1 drop Right Eye BID   heparin  5,000 Units Subcutaneous Q8H   hydrocortisone  1 Application Rectal BID   insulin aspart  0-6 Units Subcutaneous TID WC   levETIRAcetam  500 mg Oral Once per day on Sunday Monday Wednesday Friday   levETIRAcetam  750 mg Oral Once per day on Tuesday Thursday Saturday   lidocaine-prilocaine  1 Application Topical Once per day on Monday Wednesday Friday   multivitamin  1 tablet Oral QHS   sodium chloride flush  10 mL Intrapleural Q8H   sodium chloride flush  3 mL Intravenous Q12H   zinc sulfate (50mg  elemental zinc)  220 mg Oral Daily    Dialysis Orders: outpatient HD orders: [facility]. 3 hours 15 minutes. EDW: 77.5 kg (however likely well under this as per outpatient unit). LUE AVF, 15-gauge needles. Flow rates: 400/800. 37 degrees. 2K/2.5 calcium. UF profile #2. Heparin: Loading 1000 units, 400 units/h. Meds: Mircera 100 mcg every 2 weeks (last dose 3/4)   Assessment/Plan: 1. R pleural effusion: S/p thoracentesis with IR on 3/13 2. ESRD: Typically on TTS schedule, short HD Friday but refusing his regular HD today. Advised of risks, will check in again this afternoon and see if he is willing  3. HTN/volume:  BP elevated, keep EDW low as tolerated to prevent recurrent effusion.  4. Anemia: S/p 1 unit PRBC on 3/13. Hgb 7.8 today. Last mircera dose on 04/26/23.  5. Secondary hyperparathyroidism:  Calcium at goal, phos elevated. Continue calcium acetate, calcitriol and  sensipar 6. Nutrition:  Alb low, continue protein supplements  Rogers Blocker, PA-C 05/07/2023, 10:55 AM  [facility] Pager: [phone]

## 2023-05-07 NOTE — Progress Notes (Signed)
 PT Cancellation Note  Patient Details Name: Abdallah Hern. MRN: [id number] DOB: October 10, 1970   Cancelled Treatment:    Reason Eval/Treat Not Completed: Pt agreeable to PT and OT evaluations today. Requests we follow-up after lunch.  Will return this afternoon for formal evaluations.  Kathlyn Sacramento, PT, DPT [facility]  Rehabilitation Services Physical Therapist Office: [phone] Website: [email or web address]   Berton Mount 05/07/2023, 11:08 AM

## 2023-05-07 NOTE — Evaluation (Signed)
 Clinical/Bedside Swallow Evaluation Patient Details  Name: [name]. MRN: [id number] Date of Birth: 1970-06-25  Today's Date: 05/07/2023 Time: SLP Start Time (ACUTE ONLY): 1020 SLP Stop Time (ACUTE ONLY): 1027 SLP Time Calculation (min) (ACUTE ONLY): 7 min  Past Medical History:  Past Medical History:  Diagnosis Date   Acute Bil  CVAs of Bil Frontal amd Rt Parietal  in Watershed Distribution 04/21/2021   Patchy acute cortically-based infarcts within the bilateral high  frontal lobes and right parietal lobe (predominantly in a watershed  distribution).   Subcentimeter acute infarct within the callosal splenium on the  left.     Acute bilateral cerebral infarction in a watershed distribution [facility]) 04/21/2021   a.) MRI brain 04/21/2021 --> patchy ACUTE cortically-based infarcts within the bilateral high frontal lobes and right parietal lobe   Acute cerebral infarction (HCC) 02/13/2021   a.) MRI brain 02/13/2021: acute LEFT hippocampal infarct   Acute cerebral infarction (HCC) 04/21/2021   a.) MRI brain 04/21/2021: subcentimeter ACUTE infarct within the callosal splenium on the LEFT   Acute osteomyelitis of toe of right foot with gangrene (HCC) 02/26/2022   Anaphylactic reaction due to adverse effect of correct drug or medicament properly administered, initial encounter 05/08/2019   Anemia of chronic renal failure    Anxiety    Aortic dilatation (HCC) 11/25/2020   a.) TTE 11/25/2020: Ao root measured 38 mm. b.) TTE 04/22/2021: Ao root measured 44 mm; c. 11/2021 Echo: Ao root 40mm.   Atrial fibrillation (HCC)    a.) CHA2DS2-VASc = 6 (CHF, HTN, CVA x2, prior MI, T2DM). b.) rate/rhythm maintained on oral amiodarone + carvedilol; chronic OAC/AP therapy discontinued following ICH   Benign prostatic hyperplasia with lower urinary tract symptoms 11/07/2019   BPH (benign prostatic hyperplasia)    Cardiac arrest (HCC) 07/26/2021   a.) in setting of hyperkalemia, NSTEMI, and seizure  following missed HD; pulseless and apneic --> required 1 round of CPR prior to ROSC   Cellulitis    Cerebral hemorrhage (HCC) 11/21/2021   a.) CT head 11/21/2021 --> 1.4 x 1.5 x 3.1 cm ACUTE hemorrhage within the inferior aspect of the fourth ventricle, and extending inferiorly through the foramen of foramen of [place] --> occurred in setting of HTN emergency and DOAC/APT-->eliquis/plavix d/c'd.   Cerebral microvascular disease    Chronic diarrhea    Chronic heart failure with preserved ejection fraction (HFpEF) (HCC) 07/19/2017   a.) 06/2017 Echo: EF 75%; b.) 11/2020 Echo: EF 55-60%; c.) 04/2021 Echo: EF 55-60%; d.) 11/2021 Echo: EF 65-70%, GrII DD, nl RV fxn, sev dil LA, large circumferential pericardial eff w/o tamponade, triv MR, AoV sclerosis. Ao root 40mm; e.) TTE 02/28/2025: EF 60-65%, sev LVH, mod posterior LV effusion, AoV sclerosis   Chronic pain of both ankles    Coronary artery disease 02/26/2020   a. 02/2020 Ant STEMI/PCI: LM nl, LAD 53m (3.5x30 Resolute Onyx), RI nl, LCX min irregs, RCA min irregs. EF 65%.   DDD (degenerative disc disease), cervical    Diabetes mellitus, type II (HCC)    Diabetic neuropathy (HCC)    Esophagitis    ESRD (end stage renal disease) on dialysis (HCC)    a.) [facility]; T-Th-Sat   Frequent falls    Gait instability    Gastritis    Gastroparesis    GERD (gastroesophageal reflux disease)    H/O enucleation of left eyeball    Heart palpitations    History of kidney stones    HLD (hyperlipidemia)    Hypertension  Insomnia    a.) uses melatonin PRN   Long term current use of amiodarone    Nausea and vomiting in adult    recurrent   NSTEMI (non-ST elevated myocardial infarction) (HCC) 07/26/2021   a.) hyperkalemic from missed HD; seizures; decompensated to cardiac arrest and required CPR x 1 round prior to ROSC.   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  Added  automatically from request for surgery [id number]     Osteomyelitis [facility])    Pericardial effusion    a. 11/2021 Echo: EF 65-70%, GrII DD, nl RV fxn, sev dil LA, large circumferential pericardial eff w/o tamponade, triv MR, AoV sclerosis. Ao root 40mm.   Seizure (HCC)    a.) last 07/26/2021 in setting of missed HD --> hyperkalemic at 6.5 --> pulseless/apneic and required CPR; discharged home on levetiracetam.   ST elevation myocardial infarction (STEMI) of anterior wall (HCC) 02/26/2020   a.) LHC/PCI 02/26/2020 --> EF 65%; LVEDP 11 mmHg; 90% mLAD (3.5 x 20 mm Resolute Onyx DES x 1)   Past Surgical History:  Past Surgical History:  Procedure Laterality Date   A/V FISTULAGRAM Left 08/31/2021   Procedure: A/V Fistulagram;  Surgeon: Annice Needy, MD;  Location: [name] INVASIVE CV LAB;  Service: Cardiovascular;  Laterality: Left;   A/V FISTULAGRAM Left 01/25/2022   Procedure: A/V Fistulagram;  Surgeon: Annice Needy, MD;  Location: [name] INVASIVE CV LAB;  Service: Cardiovascular;  Laterality: Left;   A/V FISTULAGRAM Right 10/18/2022   Procedure: A/V Fistulagram;  Surgeon: Annice Needy, MD;  Location: [name] INVASIVE CV LAB;  Service: Cardiovascular;  Laterality: Right;   ABDOMINAL AORTOGRAM W/LOWER EXTREMITY Bilateral 02/08/2023   Procedure: ABDOMINAL AORTOGRAM W/LOWER EXTREMITY;  Surgeon: Daria Pastures, MD;  Location: [facility] INVASIVE CV LAB;  Service: Vascular;  Laterality: Bilateral;   AMPUTATION Left 03/16/2016   Procedure: AMPUTATION DIGIT LEFT HALLUX;  Surgeon: Vivi Barrack, DPM;  Location: [facility] OR;  Service: Podiatry;  Laterality: Left;  can start around 5    AMPUTATION Right 02/09/2021   Procedure: AMPUTATION DIGIT;  Surgeon: Marlyne Beards, MD;  Location: [facility] OR;  Service: Orthopedics;  Laterality: Right;   AMPUTATION Right 02/04/2023   Procedure: AMPUTATION 4TH AND 5TH METATARSAL;  Surgeon: Pilar Plate, DPM;  Location: [facility] OR;  Service: Orthopedics/Podiatry;  Laterality: Right;  I&D,  partial ray resection, abx beads   AMPUTATION Right 02/09/2023   Procedure: RIGHT BELOW KNEE AMPUTATION;  Surgeon: [name], MD;  Location: [facility] OR;  Service: Orthopedics;  Laterality: Right;   AMPUTATION Right 04/06/2023   Procedure: RIGHT ABOVE KNEE AMPUTATION;  Surgeon: [name], MD;  Location: [facility] OR;  Service: Orthopedics;  Laterality: Right;   AMPUTATION TOE Right 02/28/2022   Procedure: AMPUTATION TOE;  Surgeon: Louann Sjogren, DPM;  Location: [name] ORS;  Service: Podiatry;  Laterality: Right;  right fifth toe   ARTHROSCOPIC REPAIR ACL Left    AV FISTULA PLACEMENT Right 05/31/2019   Procedure: Brachiocephalic AV fistula creation;  Surgeon: Annice Needy, MD;  Location: [name] ORS;  Service: Vascular;  Laterality: Right;   AV FISTULA PLACEMENT Left 08/13/2021   Procedure: ARTERIOVENOUS (AV) FISTULA CREATION (BRACHIALCEPHALIC);  Surgeon: Annice Needy, MD;  Location: [name] ORS;  Service: Vascular;  Laterality: Left;   COLONOSCOPY WITH PROPOFOL N/A 10/28/2015   Procedure: COLONOSCOPY WITH PROPOFOL;  Surgeon: Christena Deem, MD;  Location: [facility] ENDOSCOPY;  Service: Endoscopy;  Laterality: N/A;   COLONOSCOPY WITH PROPOFOL N/A 10/29/2015   Procedure: COLONOSCOPY WITH PROPOFOL;  Surgeon: Christena Deem, MD;  Location: [facility] ENDOSCOPY;  Service: Endoscopy;  Laterality: N/A;   COLONOSCOPY WITH PROPOFOL N/A 01/27/2023   Procedure: COLONOSCOPY WITH PROPOFOL;  Surgeon: Franky Macho, MD;  Location: [facility] ENDO SUITE;  Service: Endoscopy;  Laterality: N/A;  12:30pm, asa 3   CORONARY ULTRASOUND/IVUS N/A 02/26/2020   Procedure: Intravascular Ultrasound/IVUS;  Surgeon: Corky Crafts, MD;  Location: [facility] INVASIVE CV LAB;  Service: Cardiovascular;  Laterality: N/A;   CORONARY/GRAFT ACUTE MI REVASCULARIZATION N/A 02/26/2020   Procedure: Coronary/Graft Acute MI Revascularization;  Surgeon: Corky Crafts, MD;  Location: [facility] INVASIVE CV LAB;  Service: Cardiovascular;  Laterality: N/A;    DIALYSIS/PERMA CATHETER INSERTION Right 04/26/2019   Perm Cath    DIALYSIS/PERMA CATHETER INSERTION N/A 04/26/2019   Procedure: DIALYSIS/PERMA CATHETER INSERTION;  Surgeon: Annice Needy, MD;  Location: [name] INVASIVE CV LAB;  Service: Cardiovascular;  Laterality: N/A;   DIALYSIS/PERMA CATHETER INSERTION N/A 05/25/2021   Procedure: DIALYSIS/PERMA CATHETER INSERTION;  Surgeon: Annice Needy, MD;  Location: [name] INVASIVE CV LAB;  Service: Cardiovascular;  Laterality: N/A;   DIALYSIS/PERMA CATHETER INSERTION N/A 08/31/2021   Procedure: DIALYSIS/PERMA CATHETER INSERTION;  Surgeon: Annice Needy, MD;  Location: [name] INVASIVE CV LAB;  Service: Cardiovascular;  Laterality: N/A;   DIALYSIS/PERMA CATHETER REMOVAL N/A 08/15/2019   Procedure: DIALYSIS/PERMA CATHETER REMOVAL;  Surgeon: Renford Dills, MD;  Location: [name] INVASIVE CV LAB;  Service: Cardiovascular;  Laterality: N/A;   DIALYSIS/PERMA CATHETER REMOVAL N/A 03/15/2022   Procedure: DIALYSIS/PERMA CATHETER REMOVAL;  Surgeon: Annice Needy, MD;  Location: [name] INVASIVE CV LAB;  Service: Cardiovascular;  Laterality: N/A;   EMBOLIZATION (CATH LAB) Right 02/06/2021   Procedure: EMBOLIZATION;  Surgeon: Annice Needy, MD;  Location: [name] INVASIVE CV LAB;  Service: Cardiovascular;  Laterality: Right;  Right Upper Extremity Dialysis Access, Permcath Placement   ESOPHAGOGASTRODUODENOSCOPY (EGD) WITH PROPOFOL N/A 12/27/2017   Procedure: ESOPHAGOGASTRODUODENOSCOPY (EGD) WITH PROPOFOL;  Surgeon: Toledo, Boykin Nearing, MD;  Location: [name] ENDOSCOPY;  Service: Gastroenterology;  Laterality: N/A;   EYE SURGERY     IR THORACENTESIS ASP PLEURAL SPACE W/IMG GUIDE  05/05/2023   LEFT HEART CATH AND CORONARY ANGIOGRAPHY N/A 02/26/2020   Procedure: LEFT HEART CATH AND CORONARY ANGIOGRAPHY;  Surgeon: Corky Crafts, MD;  Location: [facility] INVASIVE CV LAB;  Service: Cardiovascular;  Laterality: N/A;   PROSTATE SURGERY  2016   REVISON OF ARTERIOVENOUS FISTULA Right 07/22/2022    Procedure: RESECTION OF ANEURYSMAL RIGHT ARM ARTERIOVENOUS FISTULA;  Surgeon: Annice Needy, MD;  Location: [name] ORS;  Service: Vascular;  Laterality: Right;   TONSILECTOMY/ADENOIDECTOMY WITH MYRINGOTOMY     TONSILLECTOMY     UPPER EXTREMITY ANGIOGRAPHY Right 11/26/2020   Procedure: Upper Extremity Angiography;  Surgeon: Annice Needy, MD;  Location: [name] INVASIVE CV LAB;  Service: Cardiovascular;  Laterality: Right;   UPPER EXTREMITY ANGIOGRAPHY Right 02/05/2021   Procedure: UPPER EXTREMITY ANGIOGRAPHY;  Surgeon: Annice Needy, MD;  Location: [name] INVASIVE CV LAB;  Service: Cardiovascular;  Laterality: Right;   HPI:  Baldemar Dady is a 53 year old resident of [facility] who presented to the [facility] ER with seizure-like activity which occurred during dialysis.  While in the ER the patient was discovered to have a large right pleural effusion. S/p chest tube by PCCM and concern for aspiration. CXR 3/15 with RLL consolidation concerning for atelectasis v pna. Pt with a medical history of  ESRD on HD TTS, recent right BKA ([name]), CVA, atrial fibrillation not on anticoagulation, DM2, and seizure disorder    Assessment / Plan / Recommendation  Clinical Impression  Pt tolerated all consistencies trialed; however, a concern for prandial aspiration still exists given large pleural effusion.  MBSS indicated per PCCM to rule out silent aspiration.  There was prolonged oral phase with solid texture and very mild oral residue which was nearly fully cleared with liquid wash.  SLP unable to complete OME, but no facial asymmetry or pocketing observed.   Pt has been refusing assessment/treatment/procedures this morning, but he did accept PO trials with SLP and is agreeable to swallow study.  SLP will schedule MBSS as schedule permits.   SLP Visit Diagnosis: Dysphagia, unspecified (R13.10)    Aspiration Risk  Mild aspiration risk    Diet Recommendation Regular;Thin liquid    Liquid Administration via:  Cup;Straw Medication Administration: Whole meds with liquid Supervision: Patient able to self feed Compensations: Slow rate;Small sips/bites Postural Changes: Seated upright at 90 degrees    Other  Recommendations Oral Care Recommendations: Oral care BID    Recommendations for follow up therapy are one component of a multi-disciplinary discharge planning process, led by the attending physician.  Recommendations may be updated based on patient status, additional functional criteria and insurance authorization.  Follow up Recommendations  (TBD)      Assistance Recommended at Discharge  N/A  Functional Status Assessment  (TBD)  Frequency and Duration  (TBD)          Prognosis Prognosis for improved oropharyngeal function:  (TBD)      Swallow Study   General HPI: Carson Bogden is a 53 year old resident of [facility] who presented to the [facility] ER with seizure-like activity which occurred during dialysis.  While in the ER the patient was discovered to have a large right pleural effusion. S/p chest tube by PCCM and concern for aspiration. CXR 3/15 with RLL consolidation concerning for atelectasis v pna. Pt with a medical history of ESRD on HD TTS, recent right BKA ([name]), CVA, atrial fibrillation not on anticoagulation, DM2, and seizure disorder Type of Study: Bedside Swallow Evaluation Previous Swallow Assessment: CSE 02/10/23 WFL Diet Prior to this Study: Regular;Thin liquids (Level 0) Temperature Spikes Noted: No Respiratory Status: Nasal cannula History of Recent Intubation: No Behavior/Cognition: Alert;Cooperative Oral Cavity Assessment: Within Functional Limits Oral Care Completed by SLP: No Oral Cavity - Dentition: Adequate natural dentition Patient Positioning: Upright in bed Baseline Vocal Quality: Normal Volitional Cough: Cognitively unable to elicit Volitional Swallow: Unable to elicit    Oral/Motor/Sensory Function Overall Oral Motor/Sensory Function:   (unable to assess)   Ice Chips Ice chips: Not tested   Thin Liquid Thin Liquid: Within functional limits Presentation: Straw    Nectar Thick   Not tested  Honey Thick   Not tested  Puree Puree: Within functional limits Presentation: Spoon   Solid     Solid: Impaired Oral Phase Functional Implications: Prolonged oral transit      Kerrie Pleasure, MA, CCC-SLP Acute Rehabilitation Services Office: [phone] 05/07/2023,10:37 AM

## 2023-05-07 NOTE — Progress Notes (Signed)
 05/07/2023 States breathing is no different CXR looking better Giving dose of lytic and will see how drains He did have fall recently and may have hit chest so ?trauma associated effusion F/u path Can DC chest tube once <200 cc out per day If ongoing high output could consider pleurodesis  Myrla Halsted MD PCCM

## 2023-05-07 NOTE — Evaluation (Signed)
 Occupational Therapy Evaluation Patient Details Name: [name]. MRN: [id number] DOB: 07-26-1970 Today's Date: 05/07/2023   History of Present Illness   53 year old admitted 3/13 found to have large right pleural effusion, recent right AKA on 04/06/2023 with new stump ulceration, suspected breakthrough seizure. Chest tube placed (right) 3/14. PMHx: ESRD on HD TTS, recent right BKA ([name]), CVA, atrial fibrillation not on anticoagulation, DM2, and seizure disorder.     Clinical Impressions Pt admitted for above, PTA pt was at rehab working with therapy on balance and standing attempts, had assist from staff with all ADLs. Pt currently presenting with impaired balance, needs total A to mod A for ADLs and is unable to safely transfer OOB at this time without use of a lift. Pt missing L eye making it harder for him to see. OT to continue to follow pt acutely to progress pt as able, Patient would benefit from post acute skilled rehab facility with <3 hours of therapy and 24/7 support      If plan is discharge home, recommend the following:   Two people to help with walking and/or transfers;A lot of help with bathing/dressing/bathroom;Assistance with cooking/housework;Assist for transportation;Help with stairs or ramp for entrance     Functional Status Assessment   Patient has had a recent decline in their functional status and demonstrates the ability to make significant improvements in function in a reasonable and predictable amount of time.     Equipment Recommendations   [employer];Hospital bed;Wheelchair (measurements OT);Wheelchair cushion (measurements OT)     Recommendations for Other Services         Precautions/Restrictions   Precautions Precautions: Fall Recall of Precautions/Restrictions: Intact Precaution/Restrictions Comments: R AKA Restrictions Weight Bearing Restrictions Per Provider Order: No     Mobility Bed Mobility Overal bed mobility:  Needs Assistance Bed Mobility: Rolling, Sidelying to Sit, Sit to Sidelying Rolling: Used rails, Mod assist, +2 for physical assistance Sidelying to sit: Mod assist, Used rails, +2 for physical assistance, HOB elevated     Sit to sidelying: Min assist, +2 for physical assistance, +2 for safety/equipment, Used rails General bed mobility comments: Mod assist +2 for rolling, progressed gradually to min assist. Practiced towards each side a couple of times while performing peri-care/hygiene and to sit up on EOB. Required cues for technique, multimodal. Uses rail with UEs. Also, assisted lightly by pushing through LLE in bridge fashion when assisting pt to scoot up in bed. Mod assist +2 to transition to upright seated position. Initially CGA but eventually needed min assist for balance. Min assist +2 to lower onto side and return to supine. Cues throughout for technique.    Transfers Overall transfer level: Needs assistance Equipment used: None               General transfer comment: Lt knee block squat, max assist to lift buttocks from bed.      Balance Overall balance assessment: Needs assistance Sitting-balance support: Feet supported, Bilateral upper extremity supported Sitting balance-Leahy Scale: Poor Sitting balance - Comments: Intermittent min assist Postural control: Posterior lean                                 ADL either performed or assessed with clinical judgement   ADL Overall ADL's : Needs assistance/impaired Eating/Feeding: Bed level;Set up   Grooming: Bed level;Moderate assistance;Wash/dry hands Grooming Details (indicate cue type and reason): Pt physically able to perform task, has strength  and ROM. Needed mod A for thoroughness Upper Body Bathing: Sitting;Moderate assistance   Lower Body Bathing: Sitting/lateral leans;Maximal assistance   Upper Body Dressing : Sitting;Moderate assistance   Lower Body Dressing: Bed level;Total assistance      Toilet Transfer Details (indicate cue type and reason): unable to transfer at this time. Has been working on it with therapy at rehab, [employer] level with RNs at facility Toileting- Architect and Hygiene: Moderate assistance;Bed level;+2 for physical assistance               Vision Baseline Vision/History: 2 Legally blind Ability to See in Adequate Light: 2 Moderately impaired Patient Visual Report: Blurring of vision Additional Comments: L eye missing     Perception         Praxis         Pertinent Vitals/Pain Pain Assessment Pain Assessment: Faces Faces Pain Scale: Hurts even more Pain Location: buttocks Pain Descriptors / Indicators: Sore, Throbbing, Grimacing, Guarding Pain Intervention(s): Limited activity within patient's tolerance, Monitored during session, Repositioned     Extremity/Trunk Assessment Upper Extremity Assessment Upper Extremity Assessment: Overall WFL for tasks assessed RUE Deficits / Details: 4th digit amputation LUE Deficits / Details: 3rd digit DIP amputation   Lower Extremity Assessment Lower Extremity Assessment: Generalized weakness RLE Deficits / Details: Rt AKA, wound exposed - RN notified via secure chat. Able to lift >50% against gravit with hip flexion RLE: Unable to fully assess due to pain LLE Deficits / Details: Gross weakness, 4-/5. Lt heel wrapped in gauze.       Communication Communication Communication: No apparent difficulties   Cognition Arousal: Lethargic Behavior During Therapy: Flat affect                                 Following commands: Intact Following commands impaired: Follows one step commands with increased time, Only follows one step commands consistently     Cueing  General Comments   Cueing Techniques: Verbal cues;Tactile cues  Wife present, supportive   Exercises     Shoulder Instructions      Home Living Family/patient expects to be discharged to:: Skilled nursing  facility Living Arrangements: Spouse/significant other Available Help at Discharge: Family;Available PRN/intermittently Type of Home: House Home Access: Ramped entrance     Home Layout: One level     Bathroom Shower/Tub: Walk-in shower;Sponge bathes at baseline   Bathroom Toilet: Standard Bathroom Accessibility: No   Home Equipment: Rollator (4 wheels);Rolling Walker (2 wheels);BSC/3in1          Prior Functioning/Environment Prior Level of Function : Needs assist           ADLs (physical): IADLs;Bathing;Dressing 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 Has been at SNF, progressing to standing with therapy but has not completed full stand yet. Staff use hoyer lift for transfers. Wife reports he can sit with assistance at EOB. ADLs Comments: Wife reports staff have been assisting patient with bathing/dressing.    OT Problem List: Impaired balance (sitting and/or standing);Pain;Decreased strength;Impaired vision/perception   OT Treatment/Interventions: Self-care/ADL training;Therapeutic exercise;DME and/or AE instruction;Therapeutic activities;Patient/family education;Balance training      OT Goals(Current goals can be found in the care plan section)   Acute Rehab OT Goals Patient Stated Goal: To get better OT Goal Formulation: With patient/family Time For Goal Achievement: 05/21/23 Potential to Achieve Goals: Fair ADL Goals Pt Will Perform Grooming: with set-up;sitting Pt Will  Perform Lower Body Bathing: with set-up;sitting/lateral leans Pt Will Perform Lower Body Dressing: bed level;with min assist (with use of rolling) Pt Will Transfer to Toilet: with +2 assist;with mod assist;bedside commode   OT Frequency:  Min 1X/week    Co-evaluation PT/OT/SLP Co-Evaluation/Treatment: Yes Reason for Co-Treatment: To address functional/ADL transfers;Complexity of the patient's impairments (multi-system involvement);For patient/therapist  safety PT goals addressed during session: Mobility/safety with mobility;Balance;Strengthening/ROM OT goals addressed during session: ADL's and self-care;Strengthening/ROM      AM-PAC OT "6 Clicks" Daily Activity     Outcome Measure Help from another person eating meals?: A Little Help from another person taking care of personal grooming?: A Lot Help from another person toileting, which includes using toliet, bedpan, or urinal?: A Lot Help from another person bathing (including washing, rinsing, drying)?: A Lot Help from another person to put on and taking off regular upper body clothing?: A Lot Help from another person to put on and taking off regular lower body clothing?: Total 6 Click Score: 12   End of Session Nurse Communication: Mobility status;Need for lift equipment (hoyer at baseline)  Activity Tolerance: Patient tolerated treatment well Patient left: in bed;with call bell/phone within reach;with bed alarm set;with family/visitor present  OT Visit Diagnosis: Unsteadiness on feet (R26.81);Muscle weakness (generalized) (M62.81)                Time: 1610-9604 OT Time Calculation (min): 33 min Charges:  OT General Charges $OT Visit: 1 Visit OT Evaluation $OT Eval Moderate Complexity: 1 Mod  05/07/2023  AB, OTR/L  Acute Rehabilitation Services  Office: [phone]   Tristan Schroeder 05/07/2023, 3:42 PM

## 2023-05-07 NOTE — Progress Notes (Signed)
 [name].  ZOX:[id number] DOB: 1971/02/16 DOA: 05/05/2023 PCP: [name], FNP    Brief Narrative:  53 year old resident of [facility] with a medical history of ESRD on HD TTS, recent right BKA ([name]), CVA, atrial fibrillation not on anticoagulation, DM2, and seizure disorder who presented to the [facility] ER with seizure-like activity which occurred during dialysis.  While in the ER the patient was discovered to have a large right pleural effusion.  Additionally, thinning of skin of his right stump was noted with a small area of focal ulceration.  The decision was made to transfer him to [facility] where he could be seen by [name] for possible revision of his right BKA stump.  PCCM was consulted to consider a possible thoracentesis.  Significant Events: 3/13 presented to [facility] ED - transferred to [facility]  3/13 thoracentesis in IR producing ~1.5L  3/14 inpatient HD  3/14 pigtail chest tube place by PCCM for large persisting effusion s/p thora  Goals of Care:   Code Status: Full Code   DVT prophylaxis: heparin injection 5,000 Units Start: 05/05/23 2200 TED hose Start: 05/05/23 1023 SCDs Start: 05/05/23 1023   Interim Hx: Refused dialysis this morning, and refused to allow CBG to be checked last night.  Afebrile.  Vital signs stable.  No acute events recorded since my last visit.  CXR this morning as reviewed by this physician reveals greatly diminished/nearly resolved right effusion with no apparent pneumothorax with pigtail catheter in place.  Denies complaints at the time of visit.  No shortness of breath or chest pain.   Assessment & Plan:  Large right pleural effusion  Thoracentesis in IR 3/13 producing 1.5 L of hazy amber fluid -felt to likely represent a parapneumonic effusion due to chronic aspiration -Unasyn empirically initiated -CT chest revealed a very large persisting right effusion -pigtail chest tube placed by pulmonary 3/14 with improved drainage  Status post  right BKA 02/09/2023 with stump ulceration care per [name] - no evidence to suggest underlying osteomyelitis on plain films of the BKA site -will ask [name] to reevaluate 3/17 for probable stump revision  Seizure disorder -suspected breakthrough seizure Neurology suggest continuing Keppra 500 mg daily with additional 250 mg on dialysis days after dialysis - clonazepam to be used at 0.5 mg twice daily for 3 days as a bridge while underlying infection being corrected  ESRD on HD TTS Care per Nephrology -refused dialysis today but numbers appear stable therefore no acute requirement  Anemia of CKD Hemoglobin decline following admission to nadir of 6.8 -transfused 1 unit PRBC 3/14 -no evidence of gross blood loss -monitor trend  Chronic grade 2 diastolic congestive heart failure with EF 55-60% Volume management per dialysis  CAD status post DES to LAD 2022 Asymptomatic presently -continue usual beta-blocker statin and aspirin  Chronic atrial fibrillation Not on anticoagulation -continue amiodarone-well-controlled at present  Uncontrolled DM2 with hyperglycemia CBG improved since admission -monitor trend  HTN Blood pressure well-controlled at present  HLD Continue usual Lipitor  Family Communication: Spoke with wife at bedside at length Disposition: Disposition unclear presently   Objective: Blood pressure 128/68, pulse 62, temperature 98.4 F (36.9 C), temperature source Oral, resp. rate 19, height 6\' 3"  (1.905 m), weight 78 kg, SpO2 95%.  Intake/Output Summary (Last 24 hours) at 05/07/2023 1026 Last data filed at 05/06/2023 2100 Gross per 24 hour  Intake 3 ml  Output 2032.5 ml  Net -2029.5 ml   [employer]  05/06/23 0500 05/06/23 0906 05/06/23 1130  Weight: 79.8 kg 80 kg 78 kg    Examination: General: No acute respiratory distress Lungs: Markedly improved breath sounds on the right -clear to auscultation left -no wheezing Cardiovascular: Regular rate and rhythm  without murmur gallop or rub normal S1 and S2 Abdomen: Nontender, nondistended, soft, bowel sounds positive, no rebound, no ascites, no appreciable mass Extremities: No significant cyanosis, clubbing, or edema bilateral  CBC: Recent Labs  Lab 05/05/23 0804 05/05/23 1124 05/06/23 0615 05/06/23 0904 05/06/23 2011 05/07/23 0332  WBC 8.1  --  7.6 7.3  --  7.5  NEUTROABS 5.3  --   --   --   --   --   HGB 7.1*   < > 7.1* 6.8* 8.3* 7.8*  HCT 25.0*   < > 23.7* 23.0* 26.9* 25.4*  MCV 71.8*  --  70.7* 71.9*  --  72.0*  PLT 249  --  232 237  --  235   < > = values in this interval not displayed.   Basic Metabolic Panel: Recent Labs  Lab 05/05/23 0804 05/06/23 0615 05/06/23 0904 05/07/23 0332  NA 137 135 139 138  K 5.4* 5.8* 6.0* 5.3*  CL 103 105 107 102  CO2 20* 19* 21* 24  GLUCOSE 92 75 72 71  BUN 53* 61* 61* 46*  CREATININE 8.93* 10.10* 10.95* 8.43*  CALCIUM 9.1 8.6* 8.8* 8.6*  MG 2.4  --   --   --   PHOS 6.8*  --  8.1* 7.0*   GFR: Estimated Creatinine Clearance: 10.9 mL/min (A) (by C-G formula based on SCr of 8.43 mg/dL (H)).   Scheduled Meds:  sodium chloride   Intravenous Once   amiodarone  100 mg Oral BH-q7a   ascorbic acid  1,000 mg Oral Daily   aspirin EC  81 mg Oral Daily   atorvastatin  80 mg Oral QHS   calcitRIOL  2.25 mcg Oral Q T,Th,Sat-1800   calcium acetate  1,334 mg Oral TID WC   carvedilol  12.5 mg Oral BID   Chlorhexidine Gluconate Cloth  6 each Topical Q0600   cinacalcet  60 mg Oral Q T,Th,Sat-1800   clonazepam  0.5 mg Oral BID   docusate sodium  100 mg Oral BID   dorzolamide-timolol  1 drop Right Eye BID   heparin  5,000 Units Subcutaneous Q8H   hydrocortisone  1 Application Rectal BID   insulin aspart  0-6 Units Subcutaneous TID WC   levETIRAcetam  500 mg Oral Once per day on Sunday Monday Wednesday Friday   levETIRAcetam  750 mg Oral Once per day on Tuesday Thursday Saturday   lidocaine-prilocaine  1 Application Topical Once per day on Monday  Wednesday Friday   multivitamin  1 tablet Oral QHS   sodium chloride flush  10 mL Intrapleural Q8H   sodium chloride flush  3 mL Intravenous Q12H   zinc sulfate (50mg  elemental zinc)  220 mg Oral Daily   Continuous Infusions:  ampicillin-sulbactam (UNASYN) IV 3 g (05/06/23 1855)     LOS: 2 days   Lonia Blood, MD [facility] Office  [phone] Pager - Text Page per Loretha Stapler  If 7PM-7AM, please contact night-coverage per Amion 05/07/2023, 10:26 AM

## 2023-05-07 NOTE — Progress Notes (Signed)
 Pt. Refused dialysis, education provided. MD notified.

## 2023-05-07 NOTE — Procedures (Signed)
 Pleural Fibrinolytic Administration Procedure Note  [name]  [id number]  05-Jun-1970  Date:05/07/23  Time:4:16 PM   Provider Performing:Yissel Habermehl D. Harris   Procedure: Pleural Fibrinolysis Initial day (43329)  Indication(s) Fibrinolysis of complicated pleural effusion  Consent Risks of the procedure as well as the alternatives and risks of each were explained to the patient and/or caregiver.  Consent for the procedure was obtained.   Anesthesia None   Time Out Verified patient identification, verified procedure, site/side was marked, verified correct patient position, special equipment/implants available, medications/allergies/relevant history reviewed, required imaging and test results available.   Sterile Technique Hand hygiene, gloves   Procedure Description Existing pleural catheter was cleaned and accessed in sterile manner.  10mg  of tPA in 30cc of saline and 5mg  of dornase in 30cc of sterile water were injected into pleural space using existing pleural catheter.  Catheter will be clamped for 1 hour and then placed back to suction.   Complications/Tolerance None; patient tolerated the procedure well.  EBL None   Specimen(s) None  Khia Dieterich D. Harris, NP-C [facility] Personal contact information can be found on Amion  If no contact or response made please call [phone] 05/07/2023, 4:16 PM

## 2023-05-08 ENCOUNTER — Inpatient Hospital Stay (HOSPITAL_COMMUNITY)

## 2023-05-08 DIAGNOSIS — J90 Pleural effusion, not elsewhere classified: Secondary | ICD-10-CM | POA: Diagnosis not present

## 2023-05-08 MED ORDER — GERHARDT'S BUTT CREAM
TOPICAL_CREAM | Freq: Two times a day (BID) | CUTANEOUS | Status: DC
  Administered 2023-05-17: 1 via TOPICAL
  Filled 2023-05-08 (×2): qty 60

## 2023-05-08 MED ORDER — LOPERAMIDE HCL 2 MG PO CAPS
2.0000 mg | ORAL_CAPSULE | ORAL | Status: DC | PRN
  Administered 2023-05-08 – 2023-05-11 (×3): 2 mg via ORAL
  Filled 2023-05-08 (×3): qty 1

## 2023-05-08 NOTE — Progress Notes (Signed)
 [name].  UYQ:[id number] DOB: 1970/06/12 DOA: 05/05/2023 PCP: [name], FNP    Brief Narrative:  53 year old resident of [facility] with a medical history of ESRD on HD TTS, recent right BKA ([name]), CVA, atrial fibrillation not on anticoagulation, DM2, and seizure disorder who presented to the [facility] ER with seizure-like activity which occurred during dialysis.  While in the ER the patient was discovered to have a large right pleural effusion.  Additionally, thinning of skin of his right stump was noted with a small area of focal ulceration.  The decision was made to transfer him to [facility] where he could be seen by [name] for possible revision of his right BKA stump.  PCCM was consulted to consider a possible thoracentesis.  Significant Events: 3/13 presented to [facility] ED - transferred to [facility]  3/13 thoracentesis in IR producing ~1.5L  3/14 inpatient HD  3/14 pigtail chest tube place by PCCM for large persisting effusion s/p thora 3/15 lytics instilled per chest tube by PCCM  Goals of Care:   Code Status: Full Code   DVT prophylaxis: heparin injection 5,000 Units Start: 05/05/23 2200 TED hose Start: 05/05/23 1023 SCDs Start: 05/05/23 1023   Interim Hx: No acute events reported overnight.  Afebrile.  Vital signs stable.  Is having frequent bouts of watery diarrhea, but confirms to me this is a frequent occurrence and that he is followed for this in the outpatient setting by gastroenterology.  Denies any other complaints at this time.  No shortness of breath or chest pain.  No nausea or vomiting.  Assessment & Plan:  Large right pleural effusion  Thoracentesis in IR 3/13 producing 1.5 L of hazy amber fluid - felt to likely represent a parapneumonic effusion due to chronic aspiration, versus possible trauma related with history of recent falls per patient - Unasyn empirically initiated - CT chest revealed a very large persisting right effusion -pigtail chest tube  placed by pulmonary 3/14 with improved drainage - lytics infused per chest tube 3/15 - appears much improved at present with follow-up chest x-ray noting essentially resolution of effusion -potential for removal of chest tube 3/17  Status post right BKA 02/09/2023 with stump ulceration care per [name] - no evidence to suggest underlying osteomyelitis on plain films of the BKA site - will ask [name] to reevaluate 3/17 for probable stump revision  Seizure disorder - suspected breakthrough seizure Neurology suggest continuing Keppra 500 mg daily with additional 250 mg on dialysis days after dialysis - clonazepam being used at 0.5 mg twice daily for 3 days as a bridge while underlying infection being corrected  ESRD on HD TTS Care per Nephrology - refused dialysis 3/15 but numbers appear stable therefore no acute requirement  Anemia of CKD Hemoglobin declined following admission to nadir of 6.8 - transfused 1 unit PRBC 3/14 -no evidence of gross blood loss -hemoglobin stable presently  Chronic grade 2 diastolic congestive heart failure with EF 55-60% Volume management per dialysis -no gross overload presently  CAD status post DES to LAD 2022 Asymptomatic presently -continue usual beta-blocker statin and aspirin  Chronic atrial fibrillation Not on anticoagulation -continue amiodarone-well-controlled at present  Uncontrolled DM2 with hyperglycemia CBG improved since admission -monitor trend  HTN Blood pressure well-controlled at present  HLD Continue usual Lipitor  Family Communication: Spoke with the patient's wife at bedside Disposition: Disposition will depend upon whether or not [name] feels revision surgery is currently indicated   Objective:  Blood pressure 124/87, pulse 73, temperature 97.7 F (36.5 C), temperature source Oral, resp. rate 19, height 6\' 3"  (1.905 m), weight 78 kg, SpO2 97%.  Intake/Output Summary (Last 24 hours) at 05/08/2023 1040 Last data filed at  05/08/2023 0646 Gross per 24 hour  Intake 820 ml  Output 401 ml  Net 419 ml   Filed Weights   05/06/23 0500 05/06/23 0906 05/06/23 1130  Weight: 79.8 kg 80 kg 78 kg    Examination: General: No acute respiratory distress Lungs: Clear to auscultation bilaterally without wheezing Cardiovascular: RRR Abdomen: Nontender, nondistended, soft, bowel sounds positive, no rebound, no ascites, no appreciable mass Extremities: No significant cyanosis, clubbing, or edema bilateral  CBC: Recent Labs  Lab 05/05/23 0804 05/05/23 1124 05/06/23 0615 05/06/23 0904 05/06/23 2011 05/07/23 0332  WBC 8.1  --  7.6 7.3  --  7.5  NEUTROABS 5.3  --   --   --   --   --   HGB 7.1*   < > 7.1* 6.8* 8.3* 7.8*  HCT 25.0*   < > 23.7* 23.0* 26.9* 25.4*  MCV 71.8*  --  70.7* 71.9*  --  72.0*  PLT 249  --  232 237  --  235   < > = values in this interval not displayed.   Basic Metabolic Panel: Recent Labs  Lab 05/05/23 0804 05/06/23 0615 05/06/23 0904 05/07/23 0332  NA 137 135 139 138  K 5.4* 5.8* 6.0* 5.3*  CL 103 105 107 102  CO2 20* 19* 21* 24  GLUCOSE 92 75 72 71  BUN 53* 61* 61* 46*  CREATININE 8.93* 10.10* 10.95* 8.43*  CALCIUM 9.1 8.6* 8.8* 8.6*  MG 2.4  --   --   --   PHOS 6.8*  --  8.1* 7.0*   GFR: Estimated Creatinine Clearance: 10.9 mL/min (A) (by C-G formula based on SCr of 8.43 mg/dL (H)).   Scheduled Meds:  amiodarone  100 mg Oral BH-q7a   ascorbic acid  1,000 mg Oral Daily   aspirin EC  81 mg Oral Daily   atorvastatin  80 mg Oral QHS   calcitRIOL  2.25 mcg Oral Q T,Th,Sat-1800   calcium acetate  1,334 mg Oral TID WC   carvedilol  12.5 mg Oral BID   cinacalcet  60 mg Oral Q T,Th,Sat-1800   docusate sodium  100 mg Oral BID   dorzolamide-timolol  1 drop Right Eye BID   heparin  5,000 Units Subcutaneous Q8H   hydrocortisone  1 Application Rectal BID   insulin aspart  0-6 Units Subcutaneous TID WC   levETIRAcetam  500 mg Oral Once per day on Sunday Monday Wednesday Friday    levETIRAcetam  750 mg Oral Once per day on Tuesday Thursday Saturday   lidocaine-prilocaine  1 Application Topical Once per day on Monday Wednesday Friday   multivitamin  1 tablet Oral QHS   sodium chloride flush  10 mL Intrapleural Q8H   sodium chloride flush  3 mL Intravenous Q12H   zinc sulfate (50mg  elemental zinc)  220 mg Oral Daily   Continuous Infusions:  ampicillin-sulbactam (UNASYN) IV 3 g (05/07/23 2343)     LOS: 3 days   Lonia Blood, MD [facility] Office  [phone] Pager - Text Page per Loretha Stapler  If 7PM-7AM, please contact night-coverage per Amion 05/08/2023, 10:40 AM

## 2023-05-08 NOTE — Plan of Care (Signed)

## 2023-05-08 NOTE — Progress Notes (Addendum)
 05/08/2023 Fluid is essentially gone.  DC chest tube tomorrow if < 200cc output  If ongoing high output x 2 days, would check CT and if good pleural apposition, attempt talc pleurodesis so we can work on getting him home.  Will have team check on tomorrow.  Myrla Halsted MD PCCM

## 2023-05-08 NOTE — TOC Initial Note (Signed)
 Transition of Care (TOC) - Initial/Assessment Note    Patient Details  Name: [name]. MRN: [id number] Date of Birth: 1970-05-16  Transition of Care [facility]) CM/SW Contact:    Deatra Robinson, [place] Phone Number: 05/08/2023, 11:27 AM  Clinical Narrative:  Pt admitted from [facility]. Pt sleeping at time of visit. Call placed to pt's wife, no answer. Spoke to White at [facility] who confirmed pt is a Ambulance person resident and able to return at [employer] pending new auth and medical clearance.   Pt currently requiring a chest tube, plan for possible removal tomorrow per critical care. Will need to confirm plan is to return to [facility] with pt/wife and obtain new auth closer to dc.   Dellie Burns, MSW, LCSW [phone] (coverage)                    Expected Discharge Plan: Skilled Nursing Facility Barriers to Discharge: Continued Medical Work up   Patient Goals and CMS Choice            Expected Discharge Plan and Services     Post Acute Care Choice: Skilled Nursing Facility Living arrangements for the past 2 months: Skilled Nursing Facility, Single Family Home                                      Prior Living Arrangements/Services Living arrangements for the past 2 months: Skilled Nursing Facility, Single Family Home Lives with:: Spouse, Facility Resident Patient language and need for interpreter reviewed:: Yes        Need for Family Participation in Patient Care: Yes (Comment) Care giver support system in place?: Yes (comment)   Criminal Activity/Legal Involvement Pertinent to Current Situation/Hospitalization: No - Comment as needed  Activities of Daily Living   ADL Screening (condition at time of admission) Independently performs ADLs?: No Does the patient have a NEW difficulty with bathing/dressing/toileting/self-feeding that is expected to last >3 days?: Yes (Initiates electronic notice to provider for possible OT consult) Does the patient have a  NEW difficulty with getting in/out of bed, walking, or climbing stairs that is expected to last >3 days?: Yes (Initiates electronic notice to provider for possible PT consult) Does the patient have a NEW difficulty with communication that is expected to last >3 days?: Yes (Initiates electronic notice to provider for possible SLP consult) Is the patient deaf or have difficulty hearing?: No Does the patient have difficulty seeing, even when wearing glasses/contacts?: Yes Does the patient have difficulty concentrating, remembering, or making decisions?: No  Permission Sought/Granted                  Emotional Assessment       Orientation: : Oriented to Self, Oriented to Place, Oriented to  Time, Oriented to Situation Alcohol / Substance Use: Not Applicable Psych Involvement: No (comment)  Admission diagnosis:  Hyperkalemia [E87.5] Seizure (HCC) [R56.9] Seizures (HCC) [R56.9] Pleural effusion [J90] Patient Active Problem List   Diagnosis Date Noted   Pleural effusion 05/05/2023   Seizures (HCC) 05/05/2023   Right BKA infection (HCC) 04/04/2023   Pressure injury of skin 04/04/2023   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) 08/19/2020   Anxiety state 08/19/2020   Callosity 08/19/2020   ED (erectile dysfunction) of organic origin 08/19/2020   Long term (current) use of insulin (HCC) 08/19/2020   Myalgia 08/19/2020   CAD/ H/o prior STEMI 02/2020 s/p DES to LAD 08/19/2020   Blindness of left eye 08/15/2020   Combined forms of age-related cataract of right eye 04/17/2020   Moderate protein-calorie malnutrition (HCC) 05/14/2019   Disorder of phosphorus metabolism, unspecified 05/07/2019   Iron deficiency anemia, unspecified 05/07/2019   Secondary hyperparathyroidism of renal origin (HCC) 11/28/2018   Chronic Grade 2 diastolic heart failure/EF 55 to 60% 12/13/2017   Diabetes mellitus, type II (HCC) 11/23/2017   Diabetic retinopathy (HCC) 07/05/2017   Uncontrolled type 2 diabetes mellitus with hyperglycemia, with long-term current use of insulin (HCC) 04/20/2016   Mixed hyperlipidemia    Essential hypertension, benign    PCP:  [name], FNP Pharmacy:   [facility] - [place], [place] - [address] [place] [address] Phone: [phone] Fax: [phone]     Social Drivers of Health (SDOH) Social History: SDOH Screenings   Food Insecurity: No Food Insecurity (05/05/2023)  Housing: High Risk (05/05/2023)  Transportation Needs: No Transportation Needs (05/05/2023)  Utilities: Not At Risk (05/05/2023)  Financial Resource Strain: High Risk (07/05/2017)  Physical Activity: Inactive (07/05/2017)  Social Connections: Socially Integrated (05/05/2023)  Stress: Stress Concern Present (07/05/2017)  Tobacco Use: Low Risk  (05/05/2023)  Health Literacy: Low Risk  (06/01/2020)   Received  from [facility]   SDOH Interventions:     Readmission Risk Interventions    07/27/2021   11:49 AM 11/27/2020    3:06 PM  Readmission Risk Prevention Plan  Transportation Screening Complete Complete  PCP or Specialist Appt within 3-5 Days  Complete  HRI or Home Care Consult  Complete  Social Work Consult for Recovery Care Planning/Counseling  Complete  Palliative Care Screening  Not Applicable  Medication Review Oceanographer) Complete Complete  SW Recovery Care/Counseling Consult Complete   Palliative Care Screening Not Applicable   Skilled Nursing Facility Not Applicable

## 2023-05-08 NOTE — Progress Notes (Signed)
 [facility] Progress Note   Subjective:   Sleeping today, asks to be left alone. Reports he is "fine."  Objective Vitals:   05/06/23 2023 05/07/23 0458 05/07/23 0833 05/08/23 0525  BP: (!) 163/79 135/69 128/68 (!) 149/50  Pulse: 65 63 62 66  Resp: 19 17 19    Temp:  99.1 F (37.3 C) 98.4 F (36.9 C) 98.5 F (36.9 C)  TempSrc: Oral Oral Oral Oral  SpO2: 99% 96% 95% 96%  Weight:      Height:       Physical Exam General: Alert male in NAD Heart: RRR, no murmur Lungs: Respirations unlabored on RA, CTA anteriorly Abdomen: Non-distended Extremities: No peripheral edema Dialysis Access: AVF  Additional Objective Labs: Basic Metabolic Panel: Recent Labs  Lab 05/05/23 0804 05/06/23 0615 05/06/23 0904 05/07/23 0332  NA 137 135 139 138  K 5.4* 5.8* 6.0* 5.3*  CL 103 105 107 102  CO2 20* 19* 21* 24  GLUCOSE 92 75 72 71  BUN 53* 61* 61* 46*  CREATININE 8.93* 10.10* 10.95* 8.43*  CALCIUM 9.1 8.6* 8.8* 8.6*  PHOS 6.8*  --  8.1* 7.0*   Liver Function Tests: Recent Labs  Lab 05/05/23 0804 05/06/23 0904 05/07/23 0332  AST 13*  --   --   ALT 10  --   --   ALKPHOS 58  --   --   BILITOT 0.6  --   --   PROT 6.5  --   --   ALBUMIN 2.3* 2.0* 1.9*   No results for input(s): "LIPASE", "AMYLASE" in the last 168 hours. CBC: Recent Labs  Lab 05/05/23 0804 05/05/23 1124 05/06/23 0615 05/06/23 0904 05/06/23 2011 05/07/23 0332  WBC 8.1  --  7.6 7.3  --  7.5  NEUTROABS 5.3  --   --   --   --   --   HGB 7.1*   < > 7.1* 6.8* 8.3* 7.8*  HCT 25.0*   < > 23.7* 23.0* 26.9* 25.4*  MCV 71.8*  --  70.7* 71.9*  --  72.0*  PLT 249  --  232 237  --  235   < > = values in this interval not displayed.   Blood Culture    Component Value Date/Time   SDES FLUID PLEURAL RIGHT 05/05/2023 1443   SDES FLUID PLEURAL RIGHT 05/05/2023 1443   SPECREQUEST BOTTLES DRAWN AEROBIC AND ANAEROBIC 05/05/2023 1443   SPECREQUEST NONE 05/05/2023 1443   CULT  05/05/2023 1443    NO  GROWTH 2 DAYS Performed at [facility], [address] [place], [place] [address]    REPTSTATUS PENDING 05/05/2023 1443   REPTSTATUS 05/05/2023 FINAL 05/05/2023 1443    Cardiac Enzymes: No results for input(s): "CKTOTAL", "CKMB", "CKMBINDEX", "TROPONINI" in the last 168 hours. CBG: Recent Labs  Lab 05/05/23 1349 05/05/23 2053 05/06/23 0803  GLUCAP 78 106* 74   Iron Studies: No results for input(s): "IRON", "TIBC", "TRANSFERRIN", "FERRITIN" in the last 72 hours. @lablastinr3 @ Studies/Results: DG Chest Port 1 View Result Date: 05/07/2023 CLINICAL DATA:  Evaluate chest tube placement EXAM: PORTABLE CHEST 1 VIEW COMPARISON:  05/06/2023 FINDINGS: Right basilar chest tube is stable in position from previous exam. No pneumothorax identified. Stable cardiomediastinal contours. Small residual right pleural effusion appears stable to slightly increased in the interval. Left lung clear. IMPRESSION: 1. Stable position of right basilar chest tube. No pneumothorax. 2. Small residual right pleural effusion appears stable to slightly increased in the interval. Electronically Signed  By: Signa Kell M.D.   On: 05/07/2023 08:46   DG Chest Port 1 View Result Date: 05/06/2023 CLINICAL DATA:  Right chest tube, history of pleural effusion EXAM: PORTABLE CHEST 1 VIEW COMPARISON:  05/05/2023 FINDINGS: Single frontal view of the chest demonstrates near complete resolution of the right pleural effusion after placement of a right-sided pigtail drainage catheter, coiled over the right lung base. There is minimal residual consolidation at the right lung base, which may reflect atelectasis or pneumonia. Left chest is clear. No pneumothorax. Cardiac silhouette is enlarged but stable. No acute bony abnormalities. IMPRESSION: 1. Near complete resolution of right pleural effusion after pigtail drainage catheter placement. 2. Minimal residual consolidation at the right lung base which may reflect atelectasis or  pneumonia. 3. Stable enlarged cardiac silhouette. Electronically Signed   By: Sharlet Salina M.D.   On: 05/06/2023 16:24   Medications:  ampicillin-sulbactam (UNASYN) IV 3 g (05/07/23 2343)    amiodarone  100 mg Oral BH-q7a   ascorbic acid  1,000 mg Oral Daily   aspirin EC  81 mg Oral Daily   atorvastatin  80 mg Oral QHS   calcitRIOL  2.25 mcg Oral Q T,Th,Sat-1800   calcium acetate  1,334 mg Oral TID WC   carvedilol  12.5 mg Oral BID   cinacalcet  60 mg Oral Q T,Th,Sat-1800   clonazepam  0.5 mg Oral BID   docusate sodium  100 mg Oral BID   dorzolamide-timolol  1 drop Right Eye BID   heparin  5,000 Units Subcutaneous Q8H   hydrocortisone  1 Application Rectal BID   insulin aspart  0-6 Units Subcutaneous TID WC   levETIRAcetam  500 mg Oral Once per day on Sunday Monday Wednesday Friday   levETIRAcetam  750 mg Oral Once per day on Tuesday Thursday Saturday   lidocaine-prilocaine  1 Application Topical Once per day on Monday Wednesday Friday   multivitamin  1 tablet Oral QHS   sodium chloride flush  10 mL Intrapleural Q8H   sodium chloride flush  3 mL Intravenous Q12H   zinc sulfate (50mg  elemental zinc)  220 mg Oral Daily    Dialysis Orders: outpatient HD orders: [facility]. 3 hours 15 minutes. EDW: 77.5 kg (however likely well under this as per outpatient unit). LUE AVF, 15-gauge needles. Flow rates: 400/800. 37 degrees. 2K/2.5 calcium. UF profile #2. Heparin: Loading 1000 units, 400 units/h. Meds: Mircera 100 mcg every 2 weeks (last dose 3/4)     Assessment/Plan: 1. R pleural effusion: S/p thoracentesis with IR on 3/13 2. ESRD: Typically on TTS schedule, short HD Friday but refusing his regular HD Saturday. Wants to stay on his regular schedule. Check labs in AM to determine if he needs HD Monday 3. HTN/volume:  BP elevated, keep EDW low as tolerated to prevent recurrent effusion.  4. Anemia: S/p 1 unit PRBC on 3/13. Hgb 7.8 . Last mircera dose on 04/26/23.  5. Secondary  hyperparathyroidism:  Calcium at goal, phos elevated. Continue calcium acetate, calcitriol and sensipar 6. Nutrition:  Alb low, continue protein supplements   Rogers Blocker, PA-C 05/08/2023, 8:41 AM  [facility] Pager: [phone]

## 2023-05-08 NOTE — Progress Notes (Signed)
 Patient has been having frequent loose stools today. He has been eating continuously since he woke up this morning. No other endorsed symptoms by patient (no abdominal pain, no fever). Per patient, frequent stools has been a reoccurrence episodes for him in the past. Dr. Dolphus Jenny notified regarding this and to see if we need to evaluate for possible other causes. Patient already on Enteric precautions. Per MD, staff can insert a flexiseal. Patient gave permission for the flexiseal. No issues with insertion; received positive return of stools via tube.

## 2023-05-09 ENCOUNTER — Encounter (HOSPITAL_COMMUNITY): Payer: Self-pay | Admitting: Internal Medicine

## 2023-05-09 ENCOUNTER — Inpatient Hospital Stay (HOSPITAL_COMMUNITY)

## 2023-05-09 DIAGNOSIS — J90 Pleural effusion, not elsewhere classified: Secondary | ICD-10-CM | POA: Diagnosis not present

## 2023-05-09 DIAGNOSIS — T8781 Dehiscence of amputation stump: Secondary | ICD-10-CM

## 2023-05-09 LAB — GLUCOSE, CAPILLARY
Glucose-Capillary: 77 mg/dL (ref 70–99)
Glucose-Capillary: 101 mg/dL — ABNORMAL HIGH (ref 70–99)

## 2023-05-09 LAB — CBC
HCT: 26.4 % — ABNORMAL LOW (ref 39.0–52.0)
Hemoglobin: 7.9 g/dL — ABNORMAL LOW (ref 13.0–17.0)
MCH: 21.3 pg — ABNORMAL LOW (ref 26.0–34.0)
MCHC: 29.9 g/dL — ABNORMAL LOW (ref 30.0–36.0)
MCV: 71.2 fL — ABNORMAL LOW (ref 80.0–100.0)
Platelets: 243 10*3/uL (ref 150–400)
RBC: 3.71 MIL/uL — ABNORMAL LOW (ref 4.22–5.81)
RDW: 22.4 % — ABNORMAL HIGH (ref 11.5–15.5)
WBC: 9.5 10*3/uL (ref 4.0–10.5)
nRBC: 0 % (ref 0.0–0.2)

## 2023-05-09 LAB — RENAL FUNCTION PANEL
Albumin: 1.9 g/dL — ABNORMAL LOW (ref 3.5–5.0)
Anion gap: 13 (ref 5–15)
BUN: 73 mg/dL — ABNORMAL HIGH (ref 6–20)
CO2: 21 mmol/L — ABNORMAL LOW (ref 22–32)
Calcium: 8.5 mg/dL — ABNORMAL LOW (ref 8.9–10.3)
Chloride: 104 mmol/L (ref 98–111)
Creatinine, Ser: 11.74 mg/dL — ABNORMAL HIGH (ref 0.61–1.24)
GFR, Estimated: 5 mL/min — ABNORMAL LOW (ref >60)
Glucose, Bld: 83 mg/dL (ref 70–99)
Phosphorus: 9.5 mg/dL — ABNORMAL HIGH (ref 2.5–4.6)
Potassium: 5.9 mmol/L — ABNORMAL HIGH (ref 3.5–5.1)
Sodium: 138 mmol/L (ref 135–145)

## 2023-05-09 LAB — SURGICAL PCR SCREEN
MRSA, PCR: NEGATIVE
Staphylococcus aureus: NEGATIVE

## 2023-05-09 LAB — CYTOLOGY - NON PAP

## 2023-05-09 MED ORDER — CHLORHEXIDINE GLUCONATE CLOTH 2 % EX PADS
6.0000 | MEDICATED_PAD | Freq: Every day | CUTANEOUS | Status: DC
  Administered 2023-05-09 – 2023-05-14 (×5): 6 via TOPICAL

## 2023-05-09 MED ORDER — SODIUM ZIRCONIUM CYCLOSILICATE 10 G PO PACK
10.0000 g | PACK | Freq: Two times a day (BID) | ORAL | Status: DC
  Administered 2023-05-10: 10 g via ORAL
  Filled 2023-05-09: qty 1

## 2023-05-09 MED ORDER — MUPIROCIN 2 % EX OINT
1.0000 | TOPICAL_OINTMENT | Freq: Two times a day (BID) | CUTANEOUS | Status: DC
  Administered 2023-05-10: 1 via NASAL
  Filled 2023-05-09 (×2): qty 22

## 2023-05-09 NOTE — TOC Progression Note (Signed)
 Transition of Care (TOC) - Progression Note    Patient Details  Name: [name]. MRN: [id number] Date of Birth: 03-09-1970  Transition of Care [facility]) CM/SW Contact  Marliss Coots, LCSW Phone Number: 05/09/2023, 2:20 PM  Clinical Narrative:     2:20 PM CSW introduced herself and role to patient at bedside. CSW inquired about discharge plan. Patient confirmed he is to return to [facility] upon discharge.  Expected Discharge Plan: Skilled Nursing Facility Barriers to Discharge: Continued Medical Work up  Expected Discharge Plan and Services In-house Referral: Clinical Social Work   Post Acute Care Choice: Skilled Nursing Facility Living arrangements for the past 2 months: Skilled Nursing Facility, Single Family Home                                       Social Determinants of Health (SDOH) Interventions SDOH Screenings   Food Insecurity: No Food Insecurity (05/05/2023)  Housing: High Risk (05/05/2023)  Transportation Needs: No Transportation Needs (05/05/2023)  Utilities: Not At Risk (05/05/2023)  Financial Resource Strain: High Risk (07/05/2017)  Physical Activity: Inactive (07/05/2017)  Social Connections: Socially Integrated (05/05/2023)  Stress: Stress Concern Present (07/05/2017)  Tobacco Use: Low Risk  (05/05/2023)  Health Literacy: Low Risk  (06/01/2020)   Received from [facility]    Readmission Risk Interventions    07/27/2021   11:49 AM 11/27/2020    3:06 PM  Readmission Risk Prevention Plan  Transportation Screening Complete Complete  PCP or Specialist Appt within 3-5 Days  Complete  HRI or Home Care Consult  Complete  Social Work Consult for Recovery Care Planning/Counseling  Complete  Palliative Care Screening  Not Applicable  Medication Review Oceanographer) Complete Complete  SW Recovery Care/Counseling Consult Complete   Palliative Care Screening Not Applicable   Skilled Nursing Facility Not Applicable

## 2023-05-09 NOTE — Progress Notes (Signed)
 CT removed per Md order. Pt tolerated well. VSS. Primary RN at bedside.

## 2023-05-09 NOTE — Progress Notes (Signed)
Pt receives out-pt HD at [facility] on TTS. Will assist as needed.   Melven Sartorius Renal Navigator [phone]

## 2023-05-09 NOTE — Consult Note (Signed)
 ORTHOPAEDIC CONSULTATION  REQUESTING PHYSICIAN: Lonia Blood, MD  Chief Complaint: Dehiscence right above-knee amputation.  HPI: [name]. is a 53 y.o. male who presents with progressive dehiscence right above-knee amputation.  Past Medical History:  Diagnosis Date   Acute Bil  CVAs of Bil Frontal amd Rt Parietal  in Watershed Distribution 04/21/2021   Patchy acute cortically-based infarcts within the bilateral high  frontal lobes and right parietal lobe (predominantly in a watershed  distribution).   Subcentimeter acute infarct within the callosal splenium on the  left.     Acute bilateral cerebral infarction in a watershed distribution [facility]) 04/21/2021   a.) MRI brain 04/21/2021 --> patchy ACUTE cortically-based infarcts within the bilateral high frontal lobes and right parietal lobe   Acute cerebral infarction (HCC) 02/13/2021   a.) MRI brain 02/13/2021: acute LEFT hippocampal infarct   Acute cerebral infarction (HCC) 04/21/2021   a.) MRI brain 04/21/2021: subcentimeter ACUTE infarct within the callosal splenium on the LEFT   Acute osteomyelitis of toe of right foot with gangrene (HCC) 02/26/2022   Anaphylactic reaction due to adverse effect of correct drug or medicament properly administered, initial encounter 05/08/2019   Anemia of chronic renal failure    Anxiety    Aortic dilatation (HCC) 11/25/2020   a.) TTE 11/25/2020: Ao root measured 38 mm. b.) TTE 04/22/2021: Ao root measured 44 mm; c. 11/2021 Echo: Ao root 40mm.   Atrial fibrillation (HCC)    a.) CHA2DS2-VASc = 6 (CHF, HTN, CVA x2, prior MI, T2DM). b.) rate/rhythm maintained on oral amiodarone + carvedilol; chronic OAC/AP therapy discontinued following ICH   Benign prostatic hyperplasia with lower urinary tract symptoms 11/07/2019   BPH (benign prostatic hyperplasia)    Cardiac arrest (HCC) 07/26/2021   a.) in setting of hyperkalemia, NSTEMI, and seizure following missed HD; pulseless and apneic -->  required 1 round of CPR prior to ROSC   Cellulitis    Cerebral hemorrhage (HCC) 11/21/2021   a.) CT head 11/21/2021 --> 1.4 x 1.5 x 3.1 cm ACUTE hemorrhage within the inferior aspect of the fourth ventricle, and extending inferiorly through the foramen of foramen of [place] --> occurred in setting of HTN emergency and DOAC/APT-->eliquis/plavix d/c'd.   Cerebral microvascular disease    Chronic diarrhea    Chronic heart failure with preserved ejection fraction (HFpEF) (HCC) 07/19/2017   a.) 06/2017 Echo: EF 75%; b.) 11/2020 Echo: EF 55-60%; c.) 04/2021 Echo: EF 55-60%; d.) 11/2021 Echo: EF 65-70%, GrII DD, nl RV fxn, sev dil LA, large circumferential pericardial eff w/o tamponade, triv MR, AoV sclerosis. Ao root 40mm; e.) TTE 02/28/2025: EF 60-65%, sev LVH, mod posterior LV effusion, AoV sclerosis   Chronic pain of both ankles    Coronary artery disease 02/26/2020   a. 02/2020 Ant STEMI/PCI: LM nl, LAD 22m (3.5x30 Resolute Onyx), RI nl, LCX min irregs, RCA min irregs. EF 65%.   DDD (degenerative disc disease), cervical    Diabetes mellitus, type II (HCC)    Diabetic neuropathy (HCC)    Esophagitis    ESRD (end stage renal disease) on dialysis (HCC)    a.) [facility]; T-Th-Sat   Frequent falls    Gait instability    Gastritis    Gastroparesis    GERD (gastroesophageal reflux disease)    H/O enucleation of left eyeball    Heart palpitations    History of kidney stones    HLD (hyperlipidemia)    Hypertension    Insomnia    a.) uses  melatonin PRN   Long term current use of amiodarone    Nausea and vomiting in adult    recurrent   NSTEMI (non-ST elevated myocardial infarction) (HCC) 07/26/2021   a.) hyperkalemic from missed HD; seizures; decompensated to cardiac arrest and required CPR x 1 round prior to ROSC.   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  Added automatically from request for surgery [id number]      Osteomyelitis [facility])    Pericardial effusion    a. 11/2021 Echo: EF 65-70%, GrII DD, nl RV fxn, sev dil LA, large circumferential pericardial eff w/o tamponade, triv MR, AoV sclerosis. Ao root 40mm.   Seizure (HCC)    a.) last 07/26/2021 in setting of missed HD --> hyperkalemic at 6.5 --> pulseless/apneic and required CPR; discharged home on levetiracetam.   ST elevation myocardial infarction (STEMI) of anterior wall (HCC) 02/26/2020   a.) LHC/PCI 02/26/2020 --> EF 65%; LVEDP 11 mmHg; 90% mLAD (3.5 x 20 mm Resolute Onyx DES x 1)   Past Surgical History:  Procedure Laterality Date   A/V FISTULAGRAM Left 08/31/2021   Procedure: A/V Fistulagram;  Surgeon: Annice Needy, MD;  Location: [name] INVASIVE CV LAB;  Service: Cardiovascular;  Laterality: Left;   A/V FISTULAGRAM Left 01/25/2022   Procedure: A/V Fistulagram;  Surgeon: Annice Needy, MD;  Location: [name] INVASIVE CV LAB;  Service: Cardiovascular;  Laterality: Left;   A/V FISTULAGRAM Right 10/18/2022   Procedure: A/V Fistulagram;  Surgeon: Annice Needy, MD;  Location: [name] INVASIVE CV LAB;  Service: Cardiovascular;  Laterality: Right;   ABDOMINAL AORTOGRAM W/LOWER EXTREMITY Bilateral 02/08/2023   Procedure: ABDOMINAL AORTOGRAM W/LOWER EXTREMITY;  Surgeon: Daria Pastures, MD;  Location: [facility] INVASIVE CV LAB;  Service: Vascular;  Laterality: Bilateral;   AMPUTATION Left 03/16/2016   Procedure: AMPUTATION DIGIT LEFT HALLUX;  Surgeon: Vivi Barrack, DPM;  Location: [facility] OR;  Service: Podiatry;  Laterality: Left;  can start around 5    AMPUTATION Right 02/09/2021   Procedure: AMPUTATION DIGIT;  Surgeon: Marlyne Beards, MD;  Location: [facility] OR;  Service: Orthopedics;  Laterality: Right;   AMPUTATION Right 02/04/2023   Procedure: AMPUTATION 4TH AND 5TH METATARSAL;  Surgeon: Pilar Plate, DPM;  Location: [facility] OR;  Service: Orthopedics/Podiatry;  Laterality: Right;  I&D, partial ray resection, abx beads   AMPUTATION Right 02/09/2023    Procedure: RIGHT BELOW KNEE AMPUTATION;  Surgeon: [name], MD;  Location: [facility] OR;  Service: Orthopedics;  Laterality: Right;   AMPUTATION Right 04/06/2023   Procedure: RIGHT ABOVE KNEE AMPUTATION;  Surgeon: [name], MD;  Location: [facility] OR;  Service: Orthopedics;  Laterality: Right;   AMPUTATION TOE Right 02/28/2022   Procedure: AMPUTATION TOE;  Surgeon: Louann Sjogren, DPM;  Location: [name] ORS;  Service: Podiatry;  Laterality: Right;  right fifth toe   ARTHROSCOPIC REPAIR ACL Left    AV FISTULA PLACEMENT Right 05/31/2019   Procedure: Brachiocephalic AV fistula creation;  Surgeon: Annice Needy, MD;  Location: [name] ORS;  Service: Vascular;  Laterality: Right;   AV FISTULA PLACEMENT Left 08/13/2021   Procedure: ARTERIOVENOUS (AV) FISTULA CREATION (BRACHIALCEPHALIC);  Surgeon: Annice Needy, MD;  Location: [name] ORS;  Service: Vascular;  Laterality: Left;   COLONOSCOPY WITH PROPOFOL N/A 10/28/2015   Procedure: COLONOSCOPY WITH PROPOFOL;  Surgeon: Christena Deem, MD;  Location: [facility] ENDOSCOPY;  Service: Endoscopy;  Laterality: N/A;   COLONOSCOPY WITH PROPOFOL N/A 10/29/2015  Procedure: COLONOSCOPY WITH PROPOFOL;  Surgeon: Christena Deem, MD;  Location: [facility] ENDOSCOPY;  Service: Endoscopy;  Laterality: N/A;   COLONOSCOPY WITH PROPOFOL N/A 01/27/2023   Procedure: COLONOSCOPY WITH PROPOFOL;  Surgeon: Franky Macho, MD;  Location: [facility] ENDO SUITE;  Service: Endoscopy;  Laterality: N/A;  12:30pm, asa 3   CORONARY ULTRASOUND/IVUS N/A 02/26/2020   Procedure: Intravascular Ultrasound/IVUS;  Surgeon: Corky Crafts, MD;  Location: [facility] INVASIVE CV LAB;  Service: Cardiovascular;  Laterality: N/A;   CORONARY/GRAFT ACUTE MI REVASCULARIZATION N/A 02/26/2020   Procedure: Coronary/Graft Acute MI Revascularization;  Surgeon: Corky Crafts, MD;  Location: [facility] INVASIVE CV LAB;  Service: Cardiovascular;  Laterality: N/A;   DIALYSIS/PERMA CATHETER INSERTION Right 04/26/2019   Perm Cath     DIALYSIS/PERMA CATHETER INSERTION N/A 04/26/2019   Procedure: DIALYSIS/PERMA CATHETER INSERTION;  Surgeon: Annice Needy, MD;  Location: [name] INVASIVE CV LAB;  Service: Cardiovascular;  Laterality: N/A;   DIALYSIS/PERMA CATHETER INSERTION N/A 05/25/2021   Procedure: DIALYSIS/PERMA CATHETER INSERTION;  Surgeon: Annice Needy, MD;  Location: [name] INVASIVE CV LAB;  Service: Cardiovascular;  Laterality: N/A;   DIALYSIS/PERMA CATHETER INSERTION N/A 08/31/2021   Procedure: DIALYSIS/PERMA CATHETER INSERTION;  Surgeon: Annice Needy, MD;  Location: [name] INVASIVE CV LAB;  Service: Cardiovascular;  Laterality: N/A;   DIALYSIS/PERMA CATHETER REMOVAL N/A 08/15/2019   Procedure: DIALYSIS/PERMA CATHETER REMOVAL;  Surgeon: Renford Dills, MD;  Location: [name] INVASIVE CV LAB;  Service: Cardiovascular;  Laterality: N/A;   DIALYSIS/PERMA CATHETER REMOVAL N/A 03/15/2022   Procedure: DIALYSIS/PERMA CATHETER REMOVAL;  Surgeon: Annice Needy, MD;  Location: [name] INVASIVE CV LAB;  Service: Cardiovascular;  Laterality: N/A;   EMBOLIZATION (CATH LAB) Right 02/06/2021   Procedure: EMBOLIZATION;  Surgeon: Annice Needy, MD;  Location: [name] INVASIVE CV LAB;  Service: Cardiovascular;  Laterality: Right;  Right Upper Extremity Dialysis Access, Permcath Placement   ESOPHAGOGASTRODUODENOSCOPY (EGD) WITH PROPOFOL N/A 12/27/2017   Procedure: ESOPHAGOGASTRODUODENOSCOPY (EGD) WITH PROPOFOL;  Surgeon: Toledo, Boykin Nearing, MD;  Location: [name] ENDOSCOPY;  Service: Gastroenterology;  Laterality: N/A;   EYE SURGERY     IR THORACENTESIS ASP PLEURAL SPACE W/IMG GUIDE  05/05/2023   LEFT HEART CATH AND CORONARY ANGIOGRAPHY N/A 02/26/2020   Procedure: LEFT HEART CATH AND CORONARY ANGIOGRAPHY;  Surgeon: Corky Crafts, MD;  Location: [facility] INVASIVE CV LAB;  Service: Cardiovascular;  Laterality: N/A;   PROSTATE SURGERY  2016   REVISON OF ARTERIOVENOUS FISTULA Right 07/22/2022   Procedure: RESECTION OF ANEURYSMAL RIGHT ARM ARTERIOVENOUS FISTULA;   Surgeon: Annice Needy, MD;  Location: [name] ORS;  Service: Vascular;  Laterality: Right;   TONSILECTOMY/ADENOIDECTOMY WITH MYRINGOTOMY     TONSILLECTOMY     UPPER EXTREMITY ANGIOGRAPHY Right 11/26/2020   Procedure: Upper Extremity Angiography;  Surgeon: Annice Needy, MD;  Location: [name] INVASIVE CV LAB;  Service: Cardiovascular;  Laterality: Right;   UPPER EXTREMITY ANGIOGRAPHY Right 02/05/2021   Procedure: UPPER EXTREMITY ANGIOGRAPHY;  Surgeon: Annice Needy, MD;  Location: [name] INVASIVE CV LAB;  Service: Cardiovascular;  Laterality: Right;   Social History   Socioeconomic History   Marital status: Married    Spouse name: Martie Lee    Number of children: 2   Years of education: Not on file   Highest education level: High school graduate  Occupational History   Occupation: Disability    Comment: not employed  Tobacco Use   Smoking status: Never   Smokeless tobacco: Never  Vaping Use   Vaping status: Never Used  Substance  and Sexual Activity   Alcohol use: No   Drug use: No   Sexual activity: Yes  Other Topics Concern   Not on file  Social History Narrative   Oldest son killed in car crash June 2020.  Lives in [place] with his wife.   Social Drivers of Corporate investment banker Strain: High Risk (07/05/2017)   Overall Financial Resource Strain (CARDIA)    Difficulty of Paying Living Expenses: Hard  Food Insecurity: No Food Insecurity (05/05/2023)   Hunger Vital Sign    Worried About Running Out of Food in the Last Year: Never true    Ran Out of Food in the Last Year: Never true  Transportation Needs: No Transportation Needs (05/05/2023)   PRAPARE - Administrator, Civil Service (Medical): No    Lack of Transportation (Non-Medical): No  Physical Activity: Inactive (07/05/2017)   Exercise Vital Sign    Days of Exercise per Week: 0 days    Minutes of Exercise per Session: 0 min  Stress: Stress Concern Present (07/05/2017)   [employer] of Occupational  Health - Occupational Stress Questionnaire    Feeling of Stress : Rather much  Social Connections: Socially Integrated (05/05/2023)   Social Connection and Isolation Panel [NHANES]    Frequency of Communication with Friends and Family: Three times a week    Frequency of Social Gatherings with Friends and Family: Once a week    Attends Religious Services: More than 4 times per year    Active Member of [employer] or Organizations: Yes    Attends Banker Meetings: Never    Marital Status: Married   Family History  Problem Relation Age of Onset   CAD Father    Stroke Father    Diabetes Mellitus II Mother    Kidney failure Mother    Schizophrenia Mother    - negative except otherwise stated in the family history section Allergies  Allergen Reactions   Promethazine Diarrhea and Other (See Comments)    Muscle cramps, cramping "all over"   Prior to Admission medications   Medication Sig Start Date End Date Taking? Authorizing Provider  acetaminophen (TYLENOL) 325 MG tablet Take 1-2 tablets (325-650 mg total) by mouth every 4 (four) hours as needed for mild pain. 05/04/21  Yes Love, Evlyn Kanner, PA-C  amiodarone (PACERONE) 200 MG tablet Take 1 tablet (200 mg total) by mouth every morning. 08/02/22 08/03/23 Yes Corky Crafts, MD  ascorbic acid (VITAMIN C) 500 MG tablet Take 1,000 mg by mouth daily.   Yes [provider]  aspirin EC 81 MG tablet Take 1 tablet (81 mg total) by mouth daily. Swallow whole. 04/12/23  Yes Rai, Ripudeep K, MD  atorvastatin (LIPITOR) 80 MG tablet TAKE ONE TABLET BY MOUTH AT BEDTIME. 03/22/23  Yes Gaston Islam., NP  calcitRIOL (ROCALTROL) 0.25 MCG capsule Take 9 capsules (2.25 mcg total) by mouth every Tuesday, Thursday, and Saturday at 6 PM. 02/24/23  Yes Danford, Earl Lites, MD  calcium acetate (PHOSLO) 667 MG capsule Take 2 capsules (1,334 mg total) by mouth 3 (three) times daily with meals. 05/04/21  Yes Love, Evlyn Kanner, PA-C  carvedilol (COREG)  12.5 MG tablet Take 1 tablet (12.5 mg total) by mouth 2 (two) times daily. 02/23/23  Yes Danford, Earl Lites, MD  cinacalcet (SENSIPAR) 30 MG tablet Take 2 tablets (60 mg total) by mouth every Tuesday, Thursday, and Saturday at 6 PM. 02/24/23  Yes Danford, Earl Lites, MD  docusate sodium (COLACE) 100 MG capsule Take 1 capsule (100 mg total) by mouth 2 (two) times daily. Patient taking differently: Take 200 mg by mouth 2 (two) times daily. 04/12/23 04/11/24 Yes Rai, Ripudeep K, MD  dorzolamide-timolol (COSOPT) 2-0.5 % ophthalmic solution Place 1 drop into the right eye 2 (two) times daily. 11/15/22 11/15/23 Yes [provider]  doxycycline (ADOXA) 100 MG tablet Take 100 mg by mouth 2 (two) times daily.   Yes [provider]  levETIRAcetam (KEPPRA) 500 MG tablet Take 1 tablet (500 mg total) by mouth 4 (four) times a week. On Sunday, Monday, Wednesday and Friday, all non-dialysis days 02/25/23  Yes Danford, Earl Lites, MD  levETIRAcetam (KEPPRA) 750 MG tablet Take 1 tablet (750 mg total) by mouth 3 (three) times a week. On Tuesday Thursday and Saturday, on dialysis days 02/24/23  Yes Danford, Earl Lites, MD  lidocaine-prilocaine (EMLA) cream Apply 1 Application topically 3 (three) times a week. 12/24/21  Yes [provider]  loperamide (IMODIUM) 2 MG capsule Take 1 capsule (2 mg total) by mouth as needed for diarrhea or loose stools. 02/23/23  Yes Danford, Earl Lites, MD  midodrine (PROAMATINE) 5 MG tablet Take 1 tablet (5 mg total) by mouth 3 (three) times daily with meals. 02/23/23  Yes Danford, Earl Lites, MD  multivitamin (RENA-VIT) TABS tablet Take 1 tablet by mouth at bedtime. 04/12/23  Yes Rai, Ripudeep K, MD  nutrition supplement, JUVEN, (JUVEN) PACK Take 1 packet by mouth 2 (two) times daily between meals. 02/23/23  Yes Danford, Earl Lites, MD  Nutritional Supplements (,FEEDING SUPPLEMENT, PROSOURCE PLUS) liquid Take 30 mLs by mouth 3 (three) times daily between meals.  02/23/23  Yes Danford, Earl Lites, MD  ondansetron (ZOFRAN) 4 MG tablet Take 1 tablet (4 mg total) by mouth every 6 (six) hours as needed for nausea or vomiting. 03/17/23  Yes Al Decant, PA-C  polyethylene glycol (MIRALAX / GLYCOLAX) 17 g packet Take 17 g by mouth daily. 04/12/23  Yes Rai, Ripudeep K, MD  vancomycin (VANCOCIN) 125 MG capsule Take 125 mg by mouth 4 (four) times daily.   Yes [provider]  zinc sulfate, 50mg  elemental zinc, 220 (50 Zn) MG capsule Take 220 mg by mouth daily.   Yes [provider]   DG Chest Port 1 View Result Date: 05/08/2023 CLINICAL DATA:  Short of breath. EXAM: PORTABLE CHEST 1 VIEW COMPARISON:  05/07/2023 and older studies. FINDINGS: Cardiac silhouette is mildly enlarged. Pigtail chest tube projects at the right lateral lung base, unchanged. There is hazy opacity adjacent to the chest tube consistent with atelectasis. Relative volume loss noted on the right. Remainder of the right lung is clear. Mild streaky atelectasis at the medial left lung base. Remainder of the left lung is clear. Small right pleural effusion. No pneumothorax. Overall, no significant change from the previous day's study allowing for differences in patient positioning and technique. IMPRESSION: 1. No significant change from the previous day's study. 2. Right chest tube in place. No pneumothorax. 3. Small right pleural effusion and adjacent atelectasis. Electronically Signed   By: Amie Portland M.D.   On: 05/08/2023 09:23   - pertinent xrays, CT, MRI studies were reviewed and independently interpreted  Positive ROS: All other systems have been reviewed and were otherwise negative with the exception of those mentioned in the HPI and as above.  Physical Exam: General: Alert, no acute distress Psychiatric: Patient is competent for consent with normal mood and affect Lymphatic: No  axillary or cervical lymphadenopathy Cardiovascular: No pedal edema Respiratory: No  cyanosis, no use of accessory musculature GI: No organomegaly, abdomen is soft and non-tender    Images:  @ENCIMAGES @  Labs:  Lab Results  Component Value Date   HGBA1C 5.0 05/05/2023   HGBA1C 4.5 (L) 11/21/2021   HGBA1C 4.7 (L) 04/21/2021   ESRSEDRATE 74 (H) 05/05/2023   ESRSEDRATE 9 02/27/2022   ESRSEDRATE 97 (H) 02/05/2021   CRP 9.9 (H) 05/05/2023   CRP <0.5 02/27/2022   CRP 3.6 (H) 02/05/2021   REPTSTATUS PENDING 05/05/2023   REPTSTATUS 05/05/2023 FINAL 05/05/2023   GRAMSTAIN  05/05/2023    ABUNDANT WBC PRESENT,BOTH PMN AND MONONUCLEAR NO ORGANISMS SEEN Performed at [facility] Lab, [address] [place], [place] [address]    CULT  05/05/2023    NO GROWTH 4 DAYS Performed at [facility], [address], [place], [place] [address]    [facility] STAPHYLOCOCCUS AUREUS 02/04/2023    Lab Results  Component Value Date   ALBUMIN 1.9 (L) 05/07/2023   ALBUMIN 2.0 (L) 05/06/2023   ALBUMIN 2.3 (L) 05/05/2023   PREALBUMIN 22 02/27/2022        Latest Ref Rng & Units 05/07/2023    3:32 AM 05/06/2023    8:11 PM 05/06/2023    9:04 AM  CBC EXTENDED  WBC 4.0 - 10.5 K/uL 7.5   7.3   RBC 4.22 - 5.81 MIL/uL 3.53   3.20   Hemoglobin 13.0 - 17.0 g/dL 7.8  8.3  6.8   HCT 95.6 - 52.0 % 25.4  26.9  23.0   Platelets 150 - 400 K/uL 235   237     Neurologic: Patient does not have protective sensation bilateral lower extremities.   MUSCULOSKELETAL:   Skin: Examination there is wound dehiscence with fibrinous tissue over the residual limb.  Patient has had progressive dehiscence of the transtibial amputation.  Hemoglobin 7.8 with albumin of 1.9.  White cell count 7.5.    Assessment: Assessment: Progressive dehiscence right above-knee amputation.  Plan: Plan: Will plan for revision of the right above-knee amputation on Wednesday.  Patient may benefit from transfusion with 1 unit packed red blood cells with dialysis prior to surgery.  Thank you for the consult and  the opportunity to see Mr. Nhat Hearne, MD [facility] [phone] 8:14 AM

## 2023-05-09 NOTE — Progress Notes (Addendum)
 Modified Barium Swallow Study  Patient Details  Name: [name]. MRN: [id number] Date of Birth: 08-28-1970  Today's Date: 05/09/2023  Modified Barium Swallow completed.  Full report located under Chart Review in the Imaging Section.  History of Present Illness [name] is a 53 year old resident of [facility] who presented to the [facility] ER with seizure-like activity which occurred during dialysis.  While in the ER the patient was discovered to have a large right pleural effusion. S/p chest tube by PCCM and concern for aspiration. CXR 3/15 with RLL consolidation concerning for atelectasis v pna. Pt with a medical history of ESRD on HD TTS, recent right BKA ([name]), CVA, atrial fibrillation not on anticoagulation, DM2, and seizure disorder   Clinical Impression Pt demonstrates normal swallow function. No penetration, aspiraiton or residue. No significant risk for postprandial aspiration identified with pt interview and esophageal sweep appeared WNL.  SLP will sign off. Factors that may increase risk of adverse event in presence of aspiration [name] & [name] 2021):    Swallow Evaluation Recommendations Recommendations: PO diet PO Diet Recommendation: Regular;Thin liquids (Level 0) Liquid Administration via: Cup;Straw Medication Administration: Whole meds with liquid Supervision: Patient able to self-feed      [name], Riley Nearing 05/09/2023,10:17 AM

## 2023-05-09 NOTE — Progress Notes (Signed)
 05/09/2023     I have seen and evaluated the patient for pleural effusion   S:  Having pain. Wants chest tube out.    O: Blood pressure 119/60, pulse 63, temperature 98.2 F (36.8 C), resp. rate 18, height 6\' 3"  (1.905 m), weight 78 kg, SpO2 95%.  Breathing non labored, no wheeze Chest tube in place with serosanguinous drainage <100 output in the last 24 hours  Chest xray this morning reviewed - tube in adequate position No reaccumulation of fluid Pleural fluid studies reviewed - appears exudative, lymphocyte predominant Cytology negative for malignancy  A:  Right pleural effusion, exudative, lymphocyte predominant ESRD on HD PVD with Right AKA with dehiscence   P:  Ok to pull chest tube now that essentially drained dry.  Could be related to HD. Reactive from fall less likely.  Malignancy and cancer have been ruled out.  Order placed to d/c chest tube.   Pccm will sign off. Please call with questions.   Durel Salts, MD Pulmonary and Critical Care Medicine [facility] 05/09/2023 5:13 PM Pager: see AMION  If no response to pager, please call critical care on call (see AMION) until 7pm After 7:00 pm call Elink

## 2023-05-09 NOTE — H&P (View-Only) (Signed)
 ORTHOPAEDIC CONSULTATION  REQUESTING PHYSICIAN: Lonia Blood, MD  Chief Complaint: Dehiscence right above-knee amputation.  HPI: [name]. is a 53 y.o. male who presents with progressive dehiscence right above-knee amputation.  Past Medical History:  Diagnosis Date   Acute Bil  CVAs of Bil Frontal amd Rt Parietal  in Watershed Distribution 04/21/2021   Patchy acute cortically-based infarcts within the bilateral high  frontal lobes and right parietal lobe (predominantly in a watershed  distribution).   Subcentimeter acute infarct within the callosal splenium on the  left.     Acute bilateral cerebral infarction in a watershed distribution [facility]) 04/21/2021   a.) MRI brain 04/21/2021 --> patchy ACUTE cortically-based infarcts within the bilateral high frontal lobes and right parietal lobe   Acute cerebral infarction (HCC) 02/13/2021   a.) MRI brain 02/13/2021: acute LEFT hippocampal infarct   Acute cerebral infarction (HCC) 04/21/2021   a.) MRI brain 04/21/2021: subcentimeter ACUTE infarct within the callosal splenium on the LEFT   Acute osteomyelitis of toe of right foot with gangrene (HCC) 02/26/2022   Anaphylactic reaction due to adverse effect of correct drug or medicament properly administered, initial encounter 05/08/2019   Anemia of chronic renal failure    Anxiety    Aortic dilatation (HCC) 11/25/2020   a.) TTE 11/25/2020: Ao root measured 38 mm. b.) TTE 04/22/2021: Ao root measured 44 mm; c. 11/2021 Echo: Ao root 40mm.   Atrial fibrillation (HCC)    a.) CHA2DS2-VASc = 6 (CHF, HTN, CVA x2, prior MI, T2DM). b.) rate/rhythm maintained on oral amiodarone + carvedilol; chronic OAC/AP therapy discontinued following ICH   Benign prostatic hyperplasia with lower urinary tract symptoms 11/07/2019   BPH (benign prostatic hyperplasia)    Cardiac arrest (HCC) 07/26/2021   a.) in setting of hyperkalemia, NSTEMI, and seizure following missed HD; pulseless and apneic -->  required 1 round of CPR prior to ROSC   Cellulitis    Cerebral hemorrhage (HCC) 11/21/2021   a.) CT head 11/21/2021 --> 1.4 x 1.5 x 3.1 cm ACUTE hemorrhage within the inferior aspect of the fourth ventricle, and extending inferiorly through the foramen of foramen of [place] --> occurred in setting of HTN emergency and DOAC/APT-->eliquis/plavix d/c'd.   Cerebral microvascular disease    Chronic diarrhea    Chronic heart failure with preserved ejection fraction (HFpEF) (HCC) 07/19/2017   a.) 06/2017 Echo: EF 75%; b.) 11/2020 Echo: EF 55-60%; c.) 04/2021 Echo: EF 55-60%; d.) 11/2021 Echo: EF 65-70%, GrII DD, nl RV fxn, sev dil LA, large circumferential pericardial eff w/o tamponade, triv MR, AoV sclerosis. Ao root 40mm; e.) TTE 02/28/2025: EF 60-65%, sev LVH, mod posterior LV effusion, AoV sclerosis   Chronic pain of both ankles    Coronary artery disease 02/26/2020   a. 02/2020 Ant STEMI/PCI: LM nl, LAD 22m (3.5x30 Resolute Onyx), RI nl, LCX min irregs, RCA min irregs. EF 65%.   DDD (degenerative disc disease), cervical    Diabetes mellitus, type II (HCC)    Diabetic neuropathy (HCC)    Esophagitis    ESRD (end stage renal disease) on dialysis (HCC)    a.) [facility]; T-Th-Sat   Frequent falls    Gait instability    Gastritis    Gastroparesis    GERD (gastroesophageal reflux disease)    H/O enucleation of left eyeball    Heart palpitations    History of kidney stones    HLD (hyperlipidemia)    Hypertension    Insomnia    a.) uses  melatonin PRN   Long term current use of amiodarone    Nausea and vomiting in adult    recurrent   NSTEMI (non-ST elevated myocardial infarction) (HCC) 07/26/2021   a.) hyperkalemic from missed HD; seizures; decompensated to cardiac arrest and required CPR x 1 round prior to ROSC.   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  Added automatically from request for surgery [id number]      Osteomyelitis [facility])    Pericardial effusion    a. 11/2021 Echo: EF 65-70%, GrII DD, nl RV fxn, sev dil LA, large circumferential pericardial eff w/o tamponade, triv MR, AoV sclerosis. Ao root 40mm.   Seizure (HCC)    a.) last 07/26/2021 in setting of missed HD --> hyperkalemic at 6.5 --> pulseless/apneic and required CPR; discharged home on levetiracetam.   ST elevation myocardial infarction (STEMI) of anterior wall (HCC) 02/26/2020   a.) LHC/PCI 02/26/2020 --> EF 65%; LVEDP 11 mmHg; 90% mLAD (3.5 x 20 mm Resolute Onyx DES x 1)   Past Surgical History:  Procedure Laterality Date   A/V FISTULAGRAM Left 08/31/2021   Procedure: A/V Fistulagram;  Surgeon: Annice Needy, MD;  Location: [name] INVASIVE CV LAB;  Service: Cardiovascular;  Laterality: Left;   A/V FISTULAGRAM Left 01/25/2022   Procedure: A/V Fistulagram;  Surgeon: Annice Needy, MD;  Location: [name] INVASIVE CV LAB;  Service: Cardiovascular;  Laterality: Left;   A/V FISTULAGRAM Right 10/18/2022   Procedure: A/V Fistulagram;  Surgeon: Annice Needy, MD;  Location: [name] INVASIVE CV LAB;  Service: Cardiovascular;  Laterality: Right;   ABDOMINAL AORTOGRAM W/LOWER EXTREMITY Bilateral 02/08/2023   Procedure: ABDOMINAL AORTOGRAM W/LOWER EXTREMITY;  Surgeon: Daria Pastures, MD;  Location: [facility] INVASIVE CV LAB;  Service: Vascular;  Laterality: Bilateral;   AMPUTATION Left 03/16/2016   Procedure: AMPUTATION DIGIT LEFT HALLUX;  Surgeon: Vivi Barrack, DPM;  Location: [facility] OR;  Service: Podiatry;  Laterality: Left;  can start around 5    AMPUTATION Right 02/09/2021   Procedure: AMPUTATION DIGIT;  Surgeon: Marlyne Beards, MD;  Location: [facility] OR;  Service: Orthopedics;  Laterality: Right;   AMPUTATION Right 02/04/2023   Procedure: AMPUTATION 4TH AND 5TH METATARSAL;  Surgeon: Pilar Plate, DPM;  Location: [facility] OR;  Service: Orthopedics/Podiatry;  Laterality: Right;  I&D, partial ray resection, abx beads   AMPUTATION Right 02/09/2023    Procedure: RIGHT BELOW KNEE AMPUTATION;  Surgeon: [name], MD;  Location: [facility] OR;  Service: Orthopedics;  Laterality: Right;   AMPUTATION Right 04/06/2023   Procedure: RIGHT ABOVE KNEE AMPUTATION;  Surgeon: [name], MD;  Location: [facility] OR;  Service: Orthopedics;  Laterality: Right;   AMPUTATION TOE Right 02/28/2022   Procedure: AMPUTATION TOE;  Surgeon: Louann Sjogren, DPM;  Location: [name] ORS;  Service: Podiatry;  Laterality: Right;  right fifth toe   ARTHROSCOPIC REPAIR ACL Left    AV FISTULA PLACEMENT Right 05/31/2019   Procedure: Brachiocephalic AV fistula creation;  Surgeon: Annice Needy, MD;  Location: [name] ORS;  Service: Vascular;  Laterality: Right;   AV FISTULA PLACEMENT Left 08/13/2021   Procedure: ARTERIOVENOUS (AV) FISTULA CREATION (BRACHIALCEPHALIC);  Surgeon: Annice Needy, MD;  Location: [name] ORS;  Service: Vascular;  Laterality: Left;   COLONOSCOPY WITH PROPOFOL N/A 10/28/2015   Procedure: COLONOSCOPY WITH PROPOFOL;  Surgeon: Christena Deem, MD;  Location: [facility] ENDOSCOPY;  Service: Endoscopy;  Laterality: N/A;   COLONOSCOPY WITH PROPOFOL N/A 10/29/2015  Procedure: COLONOSCOPY WITH PROPOFOL;  Surgeon: Christena Deem, MD;  Location: [facility] ENDOSCOPY;  Service: Endoscopy;  Laterality: N/A;   COLONOSCOPY WITH PROPOFOL N/A 01/27/2023   Procedure: COLONOSCOPY WITH PROPOFOL;  Surgeon: Franky Macho, MD;  Location: [facility] ENDO SU[facility];  Service: Endoscopy;  Laterality: N/A;  12:30pm, asa 3   CORONARY ULTRASOUND/IVUS N/A 02/26/2020   Procedure: Intravascular Ultrasound/IVUS;  Surgeon: Corky Crafts, MD;  Location: [facility] INVASIVE CV LAB;  Service: Cardiovascular;  Laterality: N/A;   CORONARY/GRAFT ACUTE MI REVASCULARIZATION N/A 02/26/2020   Procedure: Coronary/Graft Acute MI Revascularization;  Surgeon: Corky Crafts, MD;  Location: [facility] INVASIVE CV LAB;  Service: Cardiovascular;  Laterality: N/A;   DIALYSIS/PERMA CATHETER INSERTION Right 04/26/2019   Perm Cath     DIALYSIS/PERMA CATHETER INSERTION N/A 04/26/2019   Procedure: DIALYSIS/PERMA CATHETER INSERTION;  Surgeon: Annice Needy, MD;  Location: [name] INVASIVE CV LAB;  Service: Cardiovascular;  Laterality: N/A;   DIALYSIS/PERMA CATHETER INSERTION N/A 05/25/2021   Procedure: DIALYSIS/PERMA CATHETER INSERTION;  Surgeon: Annice Needy, MD;  Location: [name] INVASIVE CV LAB;  Service: Cardiovascular;  Laterality: N/A;   DIALYSIS/PERMA CATHETER INSERTION N/A 08/31/2021   Procedure: DIALYSIS/PERMA CATHETER INSERTION;  Surgeon: Annice Needy, MD;  Location: [name] INVASIVE CV LAB;  Service: Cardiovascular;  Laterality: N/A;   DIALYSIS/PERMA CATHETER REMOVAL N/A 08/15/2019   Procedure: DIALYSIS/PERMA CATHETER REMOVAL;  Surgeon: Renford Dills, MD;  Location: [name] INVASIVE CV LAB;  Service: Cardiovascular;  Laterality: N/A;   DIALYSIS/PERMA CATHETER REMOVAL N/A 03/15/2022   Procedure: DIALYSIS/PERMA CATHETER REMOVAL;  Surgeon: Annice Needy, MD;  Location: [name] INVASIVE CV LAB;  Service: Cardiovascular;  Laterality: N/A;   EMBOLIZATION (CATH LAB) Right 02/06/2021   Procedure: EMBOLIZATION;  Surgeon: Annice Needy, MD;  Location: [name] INVASIVE CV LAB;  Service: Cardiovascular;  Laterality: Right;  Right Upper Extremity Dialysis Access, Permcath Placement   ESOPHAGOGASTRODUODENOSCOPY (EGD) WITH PROPOFOL N/A 12/27/2017   Procedure: ESOPHAGOGASTRODUODENOSCOPY (EGD) WITH PROPOFOL;  Surgeon: Toledo, Boykin Nearing, MD;  Location: [name] ENDOSCOPY;  Service: Gastroenterology;  Laterality: N/A;   EYE SURGERY     IR THORACENTESIS ASP PLEURAL SPACE W/IMG GUIDE  05/05/2023   LEFT HEART CATH AND CORONARY ANGIOGRAPHY N/A 02/26/2020   Procedure: LEFT HEART CATH AND CORONARY ANGIOGRAPHY;  Surgeon: Corky Crafts, MD;  Location: [facility] INVASIVE CV LAB;  Service: Cardiovascular;  Laterality: N/A;   PROSTATE SURGERY  2016   REVISON OF ARTERIOVENOUS FISTULA Right 07/22/2022   Procedure: RESECTION OF ANEURYSMAL RIGHT ARM ARTERIOVENOUS FISTULA;   Surgeon: Annice Needy, MD;  Location: [name] ORS;  Service: Vascular;  Laterality: Right;   TONSILECTOMY/ADENOIDECTOMY WITH MYRINGOTOMY     TONSILLECTOMY     UPPER EXTREMITY ANGIOGRAPHY Right 11/26/2020   Procedure: Upper Extremity Angiography;  Surgeon: Annice Needy, MD;  Location: [name] INVASIVE CV LAB;  Service: Cardiovascular;  Laterality: Right;   UPPER EXTREMITY ANGIOGRAPHY Right 02/05/2021   Procedure: UPPER EXTREMITY ANGIOGRAPHY;  Surgeon: Annice Needy, MD;  Location: [name] INVASIVE CV LAB;  Service: Cardiovascular;  Laterality: Right;   Social History   Socioeconomic History   Marital status: Married    Spouse name: Martie Lee    Number of children: 2   Years of education: Not on file   Highest education level: High school graduate  Occupational History   Occupation: Disability    Comment: not employed  Tobacco Use   Smoking status: Never   Smokeless tobacco: Never  Vaping Use   Vaping status: Never Used  Substance  and Sexual Activity   Alcohol use: No   Drug use: No   Sexual activity: Yes  Other Topics Concern   Not on file  Social History Narrative   Oldest son killed in car crash June 2020.  Lives in [place] with his wife.   Social Drivers of Corporate investment banker Strain: High Risk (07/05/2017)   Overall Financial Resource Strain (CARDIA)    Difficulty of Paying Living Expenses: Hard  Food Insecurity: No Food Insecurity (05/05/2023)   Hunger Vital Sign    Worried About Running Out of Food in the Last Year: Never true    Ran Out of Food in the Last Year: Never true  Transportation Needs: No Transportation Needs (05/05/2023)   PRAPARE - Administrator, Civil Service (Medical): No    Lack of Transportation (Non-Medical): No  Physical Activity: Inactive (07/05/2017)   Exercise Vital Sign    Days of Exercise per Week: 0 days    Minutes of Exercise per Session: 0 min  Stress: Stress Concern Present (07/05/2017)   [employer] of Occupational  Health - Occupational Stress Questionnaire    Feeling of Stress : Rather much  Social Connections: Socially Integrated (05/05/2023)   Social Connection and Isolation Panel [NHANES]    Frequency of Communication with Friends and Family: Three times a week    Frequency of Social Gatherings with Friends and Family: Once a week    Attends Religious Services: More than 4 times per year    Active Member of [employer] or Organizations: Yes    Attends Banker Meetings: Never    Marital Status: Married   Family History  Problem Relation Age of Onset   CAD Father    Stroke Father    Diabetes Mellitus II Mother    Kidney failure Mother    Schizophrenia Mother    - negative except otherwise stated in the family history section Allergies  Allergen Reactions   Promethazine Diarrhea and Other (See Comments)    Muscle cramps, cramping "all over"   Prior to Admission medications   Medication Sig Start Date End Date Taking? Authorizing Provider  acetaminophen (TYLENOL) 325 MG tablet Take 1-2 tablets (325-650 mg total) by mouth every 4 (four) hours as needed for mild pain. 05/04/21  Yes Love, Evlyn Kanner, PA-C  amiodarone (PACERONE) 200 MG tablet Take 1 tablet (200 mg total) by mouth every morning. 08/02/22 08/03/23 Yes Corky Crafts, MD  ascorbic acid (VITAMIN C) 500 MG tablet Take 1,000 mg by mouth daily.   Yes [provider]  aspirin EC 81 MG tablet Take 1 tablet (81 mg total) by mouth daily. Swallow whole. 04/12/23  Yes Rai, Ripudeep K, MD  atorvastatin (LIPITOR) 80 MG tablet TAKE ONE TABLET BY MOUTH AT BEDTIME. 03/22/23  Yes Gaston Islam., NP  calcitRIOL (ROCALTROL) 0.25 MCG capsule Take 9 capsules (2.25 mcg total) by mouth every Tuesday, Thursday, and Saturday at 6 PM. 02/24/23  Yes Danford, Earl Lites, MD  calcium acetate (PHOSLO) 667 MG capsule Take 2 capsules (1,334 mg total) by mouth 3 (three) times daily with meals. 05/04/21  Yes Love, Evlyn Kanner, PA-C  carvedilol (COREG)  12.5 MG tablet Take 1 tablet (12.5 mg total) by mouth 2 (two) times daily. 02/23/23  Yes Danford, Earl Lites, MD  cinacalcet (SENSIPAR) 30 MG tablet Take 2 tablets (60 mg total) by mouth every Tuesday, Thursday, and Saturday at 6 PM. 02/24/23  Yes Danford, Earl Lites, MD  docusate sodium (COLACE) 100 MG capsule Take 1 capsule (100 mg total) by mouth 2 (two) times daily. Patient taking differently: Take 200 mg by mouth 2 (two) times daily. 04/12/23 04/11/24 Yes Rai, Ripudeep K, MD  dorzolamide-timolol (COSOPT) 2-0.5 % ophthalmic solution Place 1 drop into the right eye 2 (two) times daily. 11/15/22 11/15/23 Yes [provider]  doxycycline (ADOXA) 100 MG tablet Take 100 mg by mouth 2 (two) times daily.   Yes [provider]  levETIRAcetam (KEPPRA) 500 MG tablet Take 1 tablet (500 mg total) by mouth 4 (four) times a week. On Sunday, Monday, Wednesday and Friday, all non-dialysis days 02/25/23  Yes Danford, Earl Lites, MD  levETIRAcetam (KEPPRA) 750 MG tablet Take 1 tablet (750 mg total) by mouth 3 (three) times a week. On Tuesday Thursday and Saturday, on dialysis days 02/24/23  Yes Danford, Earl Lites, MD  lidocaine-prilocaine (EMLA) cream Apply 1 Application topically 3 (three) times a week. 12/24/21  Yes [provider]  loperamide (IMODIUM) 2 MG capsule Take 1 capsule (2 mg total) by mouth as needed for diarrhea or loose stools. 02/23/23  Yes Danford, Earl Lites, MD  midodrine (PROAMATINE) 5 MG tablet Take 1 tablet (5 mg total) by mouth 3 (three) times daily with meals. 02/23/23  Yes Danford, Earl Lites, MD  multivitamin (RENA-VIT) TABS tablet Take 1 tablet by mouth at bedtime. 04/12/23  Yes Rai, Ripudeep K, MD  nutrition supplement, JUVEN, (JUVEN) PACK Take 1 packet by mouth 2 (two) times daily between meals. 02/23/23  Yes Danford, Earl Lites, MD  Nutritional Supplements (,FEEDING SUPPLEMENT, PROSOURCE PLUS) liquid Take 30 mLs by mouth 3 (three) times daily between meals.  02/23/23  Yes Danford, Earl Lites, MD  ondansetron (ZOFRAN) 4 MG tablet Take 1 tablet (4 mg total) by mouth every 6 (six) hours as needed for nausea or vomiting. 03/17/23  Yes Al Decant, PA-C  polyethylene glycol (MIRALAX / GLYCOLAX) 17 g packet Take 17 g by mouth daily. 04/12/23  Yes Rai, Ripudeep K, MD  vancomycin (VANCOCIN) 125 MG capsule Take 125 mg by mouth 4 (four) times daily.   Yes [provider]  zinc sulfate, 50mg  elemental zinc, 220 (50 Zn) MG capsule Take 220 mg by mouth daily.   Yes [provider]   DG Chest Port 1 View Result Date: 05/08/2023 CLINICAL DATA:  Short of breath. EXAM: PORTABLE CHEST 1 VIEW COMPARISON:  05/07/2023 and older studies. FINDINGS: Cardiac silhouette is mildly enlarged. Pigtail chest tube projects at the right lateral lung base, unchanged. There is hazy opacity adjacent to the chest tube consistent with atelectasis. Relative volume loss noted on the right. Remainder of the right lung is clear. Mild streaky atelectasis at the medial left lung base. Remainder of the left lung is clear. Small right pleural effusion. No pneumothorax. Overall, no significant change from the previous day's study allowing for differences in patient positioning and technique. IMPRESSION: 1. No significant change from the previous day's study. 2. Right chest tube in place. No pneumothorax. 3. Small right pleural effusion and adjacent atelectasis. Electronically Signed   By: Amie Portland M.D.   On: 05/08/2023 09:23   - pertinent xrays, CT, MRI studies were reviewed and independently interpreted  Positive ROS: All other systems have been reviewed and were otherwise negative with the exception of those mentioned in the HPI and as above.  Physical Exam: General: Alert, no acute distress Psychiatric: Patient is competent for consent with normal mood and affect Lymphatic: No  axillary or cervical lymphadenopathy Cardiovascular: No pedal edema Respiratory: No  cyanosis, no use of accessory musculature GI: No organomegaly, abdomen is soft and non-tender    Images:  @ENCIMAGES @  Labs:  Lab Results  Component Value Date   HGBA1C 5.0 05/05/2023   HGBA1C 4.5 (L) 11/21/2021   HGBA1C 4.7 (L) 04/21/2021   ESRSEDRATE 74 (H) 05/05/2023   ESRSEDRATE 9 02/27/2022   ESRSEDRATE 97 (H) 02/05/2021   CRP 9.9 (H) 05/05/2023   CRP <0.5 02/27/2022   CRP 3.6 (H) 02/05/2021   REPTSTATUS PENDING 05/05/2023   REPTSTATUS 05/05/2023 FINAL 05/05/2023   GRAMSTAIN  05/05/2023    ABUNDANT WBC PRESENT,BOTH PMN AND MONONUCLEAR NO ORGANISMS SEEN Performed at [facility] Lab, [address] [place], [place] [address]    CULT  05/05/2023    NO GROWTH 4 DAYS Performed at [facility] Lab, [address], [place], [place] [address]    [facility] STAPHYLOCOCCUS AUREUS 02/04/2023    Lab Results  Component Value Date   ALBUMIN 1.9 (L) 05/07/2023   ALBUMIN 2.0 (L) 05/06/2023   ALBUMIN 2.3 (L) 05/05/2023   PREALBUMIN 22 02/27/2022        Latest Ref Rng & Units 05/07/2023    3:32 AM 05/06/2023    8:11 PM 05/06/2023    9:04 AM  CBC EXTENDED  WBC 4.0 - 10.5 K/uL 7.5   7.3   RBC 4.22 - 5.81 MIL/uL 3.53   3.20   Hemoglobin 13.0 - 17.0 g/dL 7.8  8.3  6.8   HCT 95.6 - 52.0 % 25.4  26.9  23.0   Platelets 150 - 400 K/uL 235   237     Neurologic: Patient does not have protective sensation bilateral lower extremities.   MUSCULOSKELETAL:   Skin: Examination there is wound dehiscence with fibrinous tissue over the residual limb.  Patient has had progressive dehiscence of the transtibial amputation.  Hemoglobin 7.8 with albumin of 1.9.  White cell count 7.5.    Assessment: Assessment: Progressive dehiscence right above-knee amputation.  Plan: Plan: Will plan for revision of the right above-knee amputation on Wednesday.  Patient may benefit from transfusion with 1 unit packed red blood cells with dialysis prior to surgery.  Thank you for the consult and  the opportunity to see Mr. [name] Hearne, MD [facility] [phone] 8:14 AM

## 2023-05-09 NOTE — Progress Notes (Signed)
 [name].  ZHY:[id number] DOB: Nov 28, 1970 DOA: 05/05/2023 PCP: [name], FNP    Brief Narrative:  53 year old resident of [facility] with a medical history of ESRD on HD TTS, recent right BKA ([name]), CVA, atrial fibrillation not on anticoagulation, DM2, and seizure disorder who presented to the [facility] ER with seizure-like activity which occurred during dialysis.  While in the ER the patient was discovered to have a large right pleural effusion.  Additionally, thinning of skin of his right stump was noted with a small area of focal ulceration.  The decision was made to transfer him to [facility] where he could be seen by [name] for possible revision of his right BKA stump.  PCCM was consulted to consider a possible thoracentesis.  Significant Events: 3/13 presented to [facility] ED - transferred to [facility]  3/13 thoracentesis in IR producing ~1.5L  3/14 inpatient HD  3/14 pigtail chest tube place by PCCM for large persisting effusion s/p thora 3/15 lytics instilled per chest tube by PCCM  Goals of Care:   Code Status: Full Code   DVT prophylaxis: heparin injection 5,000 Units Start: 05/05/23 2200 TED hose Start: 05/05/23 1023 SCDs Start: 05/05/23 1023   Interim Hx: Afebrile.  Vital signs stable.  Only minimal output through chest tube over last 24 hours.  Complains of pain related to chest tube presents.  Denies shortness of breath.  No new complaints otherwise.  Assessment & Plan:  Large right pleural effusion  Thoracentesis in IR 3/13 producing 1.5 L of hazy amber fluid - felt to likely represent a parapneumonic effusion due to chronic aspiration, versus possible trauma related with history of recent falls per patient - Unasyn empirically initiated - CT chest revealed a very large persisting right effusion -pigtail chest tube placed by pulmonary 3/14 with improved drainage - lytics infused per chest tube 3/15 - appears much improved at present with follow-up chest x-ray  noting essentially resolution of effusion -potential for removal of chest tube today at discretion of PCCM  Status post right BKA 02/09/2023 with stump ulceration care per [name] - no evidence to suggest underlying osteomyelitis on plain films of the BKA site -[name] plans revision of right above-knee on 3/19 and requests transfusion of 1 unit PRBC prior to surgery  Seizure disorder - suspected breakthrough seizure Neurology suggest continuing Keppra 500 mg daily with additional 250 mg on dialysis days after dialysis - clonazepam being used at 0.5 mg twice daily for 3 days as a bridge while underlying infection being corrected  ESRD on HD TTS Care per Nephrology - refused dialysis 3/15 but numbers appear stable therefore no acute requirement  Anemia of CKD Hemoglobin declined following admission to nadir of 6.8 - transfused 1 unit PRBC 3/14 -no evidence of gross blood loss -hemoglobin stable presently -recheck in a.m. and if <8.0 will transfuse 1 additional unit with dialysis at request of [name]  Chronic grade 2 diastolic congestive heart failure with EF 55-60% Volume management per dialysis -no gross overload presently  CAD status post DES to LAD 2022 Asymptomatic presently -continue usual beta-blocker statin and aspirin  Chronic atrial fibrillation Not on anticoagulation -continue amiodarone-well-controlled at present  Uncontrolled DM2 with hyperglycemia CBG improved since admission -monitor trend  HTN Blood pressure well-controlled at present  HLD Continue usual Lipitor  Family Communication:  Disposition: Anticipate discharge home late in week after AKA revision   Objective: Blood pressure (!) 125/59, pulse 63, temperature 98.3 F (36.8  C), temperature source Oral, resp. rate 18, height 6\' 3"  (1.905 m), weight 78 kg, SpO2 94%.  Intake/Output Summary (Last 24 hours) at 05/09/2023 1004 Last data filed at 05/09/2023 0700 Gross per 24 hour  Intake 1080 ml  Output 75  ml  Net 1005 ml   Filed Weights   05/06/23 0500 05/06/23 0906 05/06/23 1130  Weight: 79.8 kg 80 kg 78 kg    Examination: General: No acute respiratory distress Lungs: Clear to auscultation bilaterally -no wheeze Cardiovascular: RRR Abdomen: NT/ND, soft, bs+, no mass  Extremities: No significant cyanosis, clubbing, or edema bilateral  CBC: Recent Labs  Lab 05/05/23 0804 05/05/23 1124 05/06/23 0904 05/06/23 2011 05/07/23 0332 05/09/23 0755  WBC 8.1   < > 7.3  --  7.5 9.5  NEUTROABS 5.3  --   --   --   --   --   HGB 7.1*   < > 6.8* 8.3* 7.8* 7.9*  HCT 25.0*   < > 23.0* 26.9* 25.4* 26.4*  MCV 71.8*   < > 71.9*  --  72.0* 71.2*  PLT 249   < > 237  --  235 243   < > = values in this interval not displayed.   Basic Metabolic Panel: Recent Labs  Lab 05/05/23 0804 05/06/23 0615 05/06/23 0904 05/07/23 0332 05/09/23 0755  NA 137   < > 139 138 138  K 5.4*   < > 6.0* 5.3* 5.9*  CL 103   < > 107 102 104  CO2 20*   < > 21* 24 21*  GLUCOSE 92   < > 72 71 83  BUN 53*   < > 61* 46* 73*  CREATININE 8.93*   < > 10.95* 8.43* 11.74*  CALCIUM 9.1   < > 8.8* 8.6* 8.5*  MG 2.4  --   --   --   --   PHOS 6.8*  --  8.1* 7.0* 9.5*   < > = values in this interval not displayed.   GFR: Estimated Creatinine Clearance: 7.8 mL/min (A) (by C-G formula based on SCr of 11.74 mg/dL (H)).   Scheduled Meds:  amiodarone  100 mg Oral BH-q7a   ascorbic acid  1,000 mg Oral Daily   aspirin EC  81 mg Oral Daily   atorvastatin  80 mg Oral QHS   calcitRIOL  2.25 mcg Oral Q T,Th,Sat-1800   calcium acetate  1,334 mg Oral TID WC   carvedilol  12.5 mg Oral BID   cinacalcet  60 mg Oral Q T,Th,Sat-1800   docusate sodium  100 mg Oral BID   dorzolamide-timolol  1 drop Right Eye BID   Gerhardt's butt cream   Topical BID   heparin  5,000 Units Subcutaneous Q8H   hydrocortisone  1 Application Rectal BID   insulin aspart  0-6 Units Subcutaneous TID WC   levETIRAcetam  500 mg Oral Once per day on Sunday  Monday Wednesday Friday   levETIRAcetam  750 mg Oral Once per day on Tuesday Thursday Saturday   lidocaine-prilocaine  1 Application Topical Once per day on Monday Wednesday Friday   multivitamin  1 tablet Oral QHS   sodium chloride flush  10 mL Intrapleural Q8H   sodium chloride flush  3 mL Intravenous Q12H   zinc sulfate (50mg  elemental zinc)  220 mg Oral Daily   Continuous Infusions:  ampicillin-sulbactam (UNASYN) IV 3 g (05/08/23 1801)     LOS: 4 days   Lonia Blood, MD [facility]  Office  [phone] Pager - Text Page per Loretha Stapler  If 7PM-7AM, please contact night-coverage per Amion 05/09/2023, 10:04 AM

## 2023-05-09 NOTE — Progress Notes (Signed)
 [facility] Progress Note   Subjective:    Seen and examined patient at bedside. K+ 5.9 this AM and wanted to dialyze today but he refused and wants to go on his routine schedule (TTS). Denies SOB and CP. Ordered Lokelma X 2 for today. Seen by Ortho this AM. Plan for HD tomorrow morning and will give 1 unit PRBCs per Ortho's recommendation. Plan for revision of R AKA 3/19 by [name].  Objective Vitals:   05/08/23 1732 05/08/23 2032 05/09/23 0515 05/09/23 0845  BP: 119/67 (!) 107/49 (!) 143/68 (!) 125/59  Pulse: 65 68 62 63  Resp:  18 18 18   Temp: 98.4 F (36.9 C) 98.4 F (36.9 C) 98.3 F (36.8 C)   TempSrc:  Oral Oral   SpO2: 94% 97% 100% 94%  Weight:      Height:       Physical Exam General: Alert male in NAD Heart: RRR, no murmur Lungs: Respirations unlabored on RA, CTA anteriorly Abdomen: Non-distended Extremities: No peripheral edema Dialysis Access: AVF  Filed Weights   05/06/23 0500 05/06/23 0906 05/06/23 1130  Weight: 79.8 kg 80 kg 78 kg    Intake/Output Summary (Last 24 hours) at 05/09/2023 1043 Last data filed at 05/09/2023 0700 Gross per 24 hour  Intake 1080 ml  Output 75 ml  Net 1005 ml    Additional Objective Labs: Basic Metabolic Panel: Recent Labs  Lab 05/06/23 0904 05/07/23 0332 05/09/23 0755  NA 139 138 138  K 6.0* 5.3* 5.9*  CL 107 102 104  CO2 21* 24 21*  GLUCOSE 72 71 83  BUN 61* 46* 73*  CREATININE 10.95* 8.43* 11.74*  CALCIUM 8.8* 8.6* 8.5*  PHOS 8.1* 7.0* 9.5*   Liver Function Tests: Recent Labs  Lab 05/05/23 0804 05/06/23 0904 05/07/23 0332 05/09/23 0755  AST 13*  --   --   --   ALT 10  --   --   --   ALKPHOS 58  --   --   --   BILITOT 0.6  --   --   --   PROT 6.5  --   --   --   ALBUMIN 2.3* 2.0* 1.9* 1.9*   No results for input(s): "LIPASE", "AMYLASE" in the last 168 hours. CBC: Recent Labs  Lab 05/05/23 0804 05/05/23 1124 05/06/23 0615 05/06/23 0904 05/06/23 2011 05/07/23 0332 05/09/23 0755   WBC 8.1  --  7.6 7.3  --  7.5 9.5  NEUTROABS 5.3  --   --   --   --   --   --   HGB 7.1*   < > 7.1* 6.8* 8.3* 7.8* 7.9*  HCT 25.0*   < > 23.7* 23.0* 26.9* 25.4* 26.4*  MCV 71.8*  --  70.7* 71.9*  --  72.0* 71.2*  PLT 249  --  232 237  --  235 243   < > = values in this interval not displayed.   Blood Culture    Component Value Date/Time   SDES FLUID PLEURAL RIGHT 05/05/2023 1443   SDES FLUID PLEURAL RIGHT 05/05/2023 1443   SPECREQUEST BOTTLES DRAWN AEROBIC AND ANAEROBIC 05/05/2023 1443   SPECREQUEST NONE 05/05/2023 1443   CULT  05/05/2023 1443    NO GROWTH 4 DAYS Performed at [facility], [address] [place], [place] [address]    REPTSTATUS PENDING 05/05/2023 1443   REPTSTATUS 05/05/2023 FINAL 05/05/2023 1443    Cardiac Enzymes: No results for input(s): "CKTOTAL", "CKMB", "CKMBINDEX", "  TROPONINI" in the last 168 hours. CBG: Recent Labs  Lab 05/05/23 1349 05/05/23 2053 05/06/23 0803 05/09/23 0838  GLUCAP 78 106* 74 77   Iron Studies: No results for input(s): "IRON", "TIBC", "TRANSFERRIN", "FERRITIN" in the last 72 hours. Lab Results  Component Value Date   INR 1.2 05/06/2023   INR 1.2 05/05/2023   INR 1.1 03/01/2022   Studies/Results: DG Swallowing Func-Speech Pathology Result Date: 05/09/2023 Table formatting from the original result was not included. Modified Barium Swallow Study Patient Details Name: Sheila Gervasi. MRN: [id number] Date of Birth: 1970/03/13 Today's Date: 05/09/2023 HPI/PMH: HPI: Nyko Gell is a 53 year old resident of [facility] who presented to the [facility] ER with seizure-like activity which occurred during dialysis.  While in the ER the patient was discovered to have a large right pleural effusion. S/p chest tube by PCCM and concern for aspiration. CXR 3/15 with RLL consolidation concerning for atelectasis v pna. Pt with a medical history of ESRD on HD TTS, recent right BKA ([name]), CVA, atrial fibrillation not on  anticoagulation, DM2, and seizure disorder Clinical Impression: Clinical Impression: Pt demonstrates normal swallow function. No penetration, aspiraiton or residue. No significant risk for postprandial aspiration identified with pt interview and esopahgeal sweep appeared WNL.  SLP will sign off. Factors that may increase risk of adverse event in presence of aspiration [name] & [name] 2021): No data recorded Recommendations/Plan: Swallowing Evaluation Recommendations Swallowing Evaluation Recommendations Recommendations: PO diet PO Diet Recommendation: Regular; Thin liquids (Level 0) Liquid Administration via: Cup; Straw Medication Administration: Whole meds with liquid Supervision: Patient able to self-feed Treatment Plan Treatment Plan Treatment recommendations: No treatment recommended at this time Recommendations Recommendations for follow up therapy are one component of a multi-disciplinary discharge planning process, led by the attending physician.  Recommendations may be updated based on patient status, additional functional criteria and insurance authorization. Assessment: Orofacial Exam: No data recorded Anatomy: Anatomy: WFL Boluses Administered: Boluses Administered Boluses Administered: Thin liquids (Level 0); Puree; Solid  Oral Impairment Domain: Oral Impairment Domain Lip Closure: No labial escape Tongue control during bolus hold: Not tested Bolus preparation/mastication: Slow prolonged chewing/mashing with complete recollection Bolus transport/lingual motion: Brisk tongue motion Oral residue: Complete oral clearance Location of oral residue : N/A Initiation of pharyngeal swallow : Pyriform sinuses  Pharyngeal Impairment Domain: Pharyngeal Impairment Domain Soft palate elevation: No bolus between soft palate (SP)/pharyngeal wall (PW) Laryngeal elevation: Complete superior movement of thyroid cartilage with complete approximation of arytenoids to epiglottic petiole Anterior hyoid excursion: Complete  anterior movement Epiglottic movement: Complete inversion Laryngeal vestibule closure: Complete, no air/contrast in laryngeal vestibule Tongue base retraction: No contrast between tongue base and posterior pharyngeal wall (PPW) Pharyngeal residue: Complete pharyngeal clearance Location of pharyngeal residue: N/A  Esophageal Impairment Domain: Esophageal Impairment Domain Esophageal clearance upright position: Complete clearance, esophageal coating Pill: Pill Consistency administered: Thin liquids (Level 0) Penetration/Aspiration Scale Score: Penetration/Aspiration Scale Score 1.  Material does not enter airway: Thin liquids (Level 0); Puree; Solid; Pill Compensatory Strategies: Compensatory Strategies Compensatory strategies: No   General Information: Caregiver present: No  Diet Prior to this Study: Regular; Thin liquids (Level 0)   Temperature : Normal   No data recorded  Supplemental O2: None (Room air)   History of Recent Intubation: No  Behavior/Cognition: Alert; Cooperative Self-Feeding Abilities: Able to self-feed Baseline vocal quality/speech: Normal Volitional Cough: Able to elicit Volitional Swallow: Unable to elicit No data recorded Goal Planning: No data recorded No data recorded No data recorded No data  recorded No data recorded Pain: No data recorded End of Session: Start Time:SLP Start Time (ACUTE ONLY): 0935 Stop Time: SLP Stop Time (ACUTE ONLY): 1000 Time Calculation:SLP Time Calculation (min) (ACUTE ONLY): 25 min Charges: SLP Evaluations $ SLP Speech Visit: 1 Visit SLP Evaluations $MBS Swallow: 1 Procedure SLP visit diagnosis: SLP Visit Diagnosis: Dysphagia, unspecified (R13.10) Past Medical History: Past Medical History: Diagnosis Date  Acute Bil  CVAs of Bil Frontal amd Rt Parietal  in Watershed Distribution 04/21/2021  Patchy acute cortically-based infarcts within the bilateral high  frontal lobes and right parietal lobe (predominantly in a watershed  distribution).   Subcentimeter acute infarct  within the callosal splenium on the  left.    Acute bilateral cerebral infarction in a watershed distribution [facility]) 04/21/2021  a.) MRI brain 04/21/2021 --> patchy ACUTE cortically-based infarcts within the bilateral high frontal lobes and right parietal lobe  Acute cerebral infarction (HCC) 02/13/2021  a.) MRI brain 02/13/2021: acute LEFT hippocampal infarct  Acute cerebral infarction (HCC) 04/21/2021  a.) MRI brain 04/21/2021: subcentimeter ACUTE infarct within the callosal splenium on the LEFT  Acute osteomyelitis of toe of right foot with gangrene (HCC) 02/26/2022  Anaphylactic reaction due to adverse effect of correct drug or medicament properly administered, initial encounter 05/08/2019  Anemia of chronic renal failure   Anxiety   Aortic dilatation (HCC) 11/25/2020  a.) TTE 11/25/2020: Ao root measured 38 mm. b.) TTE 04/22/2021: Ao root measured 44 mm; c. 11/2021 Echo: Ao root 40mm.  Atrial fibrillation (HCC)   a.) CHA2DS2-VASc = 6 (CHF, HTN, CVA x2, prior MI, T2DM). b.) rate/rhythm maintained on oral amiodarone + carvedilol; chronic OAC/AP therapy discontinued following ICH  Benign prostatic hyperplasia with lower urinary tract symptoms 11/07/2019  BPH (benign prostatic hyperplasia)   Cardiac arrest (HCC) 07/26/2021  a.) in setting of hyperkalemia, NSTEMI, and seizure following missed HD; pulseless and apneic --> required 1 round of CPR prior to ROSC  Cellulitis   Cerebral hemorrhage (HCC) 11/21/2021  a.) CT head 11/21/2021 --> 1.4 x 1.5 x 3.1 cm ACUTE hemorrhage within the inferior aspect of the fourth ventricle, and extending inferiorly through the foramen of foramen of [place] --> occurred in setting of HTN emergency and DOAC/APT-->eliquis/plavix d/c'd.  Cerebral microvascular disease   Chronic diarrhea   Chronic heart failure with preserved ejection fraction (HFpEF) (HCC) 07/19/2017  a.) 06/2017 Echo: EF 75%; b.) 11/2020 Echo: EF 55-60%; c.) 04/2021 Echo: EF 55-60%; d.) 11/2021 Echo: EF 65-70%, GrII DD, nl  RV fxn, sev dil LA, large circumferential pericardial eff w/o tamponade, triv MR, AoV sclerosis. Ao root 40mm; e.) TTE 02/28/2025: EF 60-65%, sev LVH, mod posterior LV effusion, AoV sclerosis  Chronic pain of both ankles   Coronary artery disease 02/26/2020  a. 02/2020 Ant STEMI/PCI: LM nl, LAD 35m (3.5x30 Resolute Onyx), RI nl, LCX min irregs, RCA min irregs. EF 65%.  DDD (degenerative disc disease), cervical   Diabetes mellitus, type II (HCC)   Diabetic neuropathy (HCC)   Esophagitis   ESRD (end stage renal disease) on dialysis (HCC)   a.) [facility]; T-Th-Sat  Frequent falls   Gait instability   Gastritis   Gastroparesis   GERD (gastroesophageal reflux disease)   H/O enucleation of left eyeball   Heart palpitations   History of kidney stones   HLD (hyperlipidemia)   Hypertension   Insomnia   a.) uses melatonin PRN  Long term current use of amiodarone   Nausea and vomiting in adult   recurrent  NSTEMI (non-ST elevated  myocardial infarction) (HCC) 07/26/2021  a.) hyperkalemic from missed HD; seizures; decompensated to cardiac arrest and required CPR x 1 round prior to ROSC.  NVG (neovascular glaucoma), left, 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  Formatting of this note might be different from the original.  Added automatically from request for surgery [id number]    Osteomyelitis [facility])   Pericardial effusion   a. 11/2021 Echo: EF 65-70%, GrII DD, nl RV fxn, sev dil LA, large circumferential pericardial eff w/o tamponade, triv MR, AoV sclerosis. Ao root 40mm.  Seizure (HCC)   a.) last 07/26/2021 in setting of missed HD --> hyperkalemic at 6.5 --> pulseless/apneic and required CPR; discharged home on levetiracetam.  ST elevation myocardial infarction (STEMI) of anterior wall (HCC) 02/26/2020  a.) LHC/PCI 02/26/2020 --> EF 65%; LVEDP 11 mmHg; 90% mLAD (3.5 x 20 mm Resolute Onyx DES x 1) Past Surgical History: Past Surgical History: Procedure Laterality Date  A/V FISTULAGRAM Left 08/31/2021  Procedure: A/V Fistulagram;  Surgeon: Annice Needy, MD;  Location: [name] INVASIVE CV LAB;  Service: Cardiovascular;  Laterality: Left;  A/V FISTULAGRAM Left 01/25/2022  Procedure: A/V Fistulagram;  Surgeon: Annice Needy, MD;  Location: [name] INVASIVE CV LAB;  Service: Cardiovascular;  Laterality: Left;  A/V FISTULAGRAM Right 10/18/2022  Procedure: A/V Fistulagram;  Surgeon: Annice Needy, MD;  Location: [name] INVASIVE CV LAB;  Service: Cardiovascular;  Laterality: Right;  ABDOMINAL AORTOGRAM W/LOWER EXTREMITY Bilateral 02/08/2023  Procedure: ABDOMINAL AORTOGRAM W/LOWER EXTREMITY;  Surgeon: Daria Pastures, MD;  Location: [facility] INVASIVE CV LAB;  Service: Vascular;  Laterality: Bilateral;  AMPUTATION Left 03/16/2016  Procedure: AMPUTATION DIGIT LEFT HALLUX;  Surgeon: Vivi Barrack, DPM;  Location: [facility] OR;  Service: Podiatry;  Laterality: Left;  can start around 5   AMPUTATION Right 02/09/2021  Procedure: AMPUTATION DIGIT;  Surgeon: Marlyne Beards, MD;  Location: [facility] OR;  Service: Orthopedics;  Laterality: Right;  AMPUTATION Right 02/04/2023  Procedure: AMPUTATION 4TH AND 5TH METATARSAL;  Surgeon: Pilar Plate, DPM;  Location: [facility] OR;  Service: Orthopedics/Podiatry;  Laterality: Right;  I&D, partial ray resection, abx beads  AMPUTATION Right 02/09/2023  Procedure: RIGHT BELOW KNEE AMPUTATION;  Surgeon: [name], MD;  Location: [facility] OR;  Service: Orthopedics;  Laterality: Right;  AMPUTATION Right 04/06/2023  Procedure: RIGHT ABOVE KNEE AMPUTATION;  Surgeon: [name], MD;  Location: [facility] OR;  Service: Orthopedics;  Laterality: Right;  AMPUTATION TOE Right 02/28/2022  Procedure: AMPUTATION TOE;  Surgeon: Louann Sjogren, DPM;  Location: [name] ORS;  Service: Podiatry;  Laterality: Right;  right fifth toe  ARTHROSCOPIC REPAIR ACL Left   AV FISTULA PLACEMENT Right 05/31/2019  Procedure: Brachiocephalic AV fistula creation;  Surgeon: Annice Needy, MD;  Location: [name] ORS;  Service: Vascular;  Laterality: Right;  AV FISTULA PLACEMENT Left 08/13/2021   Procedure: ARTERIOVENOUS (AV) FISTULA CREATION (BRACHIALCEPHALIC);  Surgeon: Annice Needy, MD;  Location: [name] ORS;  Service: Vascular;  Laterality: Left;  COLONOSCOPY WITH PROPOFOL N/A 10/28/2015  Procedure: COLONOSCOPY WITH PROPOFOL;  Surgeon: Christena Deem, MD;  Location: [facility] ENDOSCOPY;  Service: Endoscopy;  Laterality: N/A;  COLONOSCOPY WITH PROPOFOL N/A 10/29/2015  Procedure: COLONOSCOPY WITH PROPOFOL;  Surgeon: Christena Deem, MD;  Location: [facility] ENDOSCOPY;  Service: Endoscopy;  Laterality: N/A;  COLONOSCOPY WITH PROPOFOL N/A 01/27/2023  Procedure: COLONOSCOPY WITH PROPOFOL;  Surgeon: Franky Macho, MD;  Location: [facility] ENDO SUITE;  Service: Endoscopy;  Laterality: N/A;  12:30pm, asa 3  CORONARY ULTRASOUND/IVUS N/A 02/26/2020  Procedure: Intravascular Ultrasound/IVUS;  Surgeon: Lance Muss  S, MD;  Location: [facility] INVASIVE CV LAB;  Service: Cardiovascular;  Laterality: N/A;  CORONARY/GRAFT ACUTE MI REVASCULARIZATION N/A 02/26/2020  Procedure: Coronary/Graft Acute MI Revascularization;  Surgeon: Corky Crafts, MD;  Location: [facility] INVASIVE CV LAB;  Service: Cardiovascular;  Laterality: N/A;  DIALYSIS/PERMA CATHETER INSERTION Right 04/26/2019  Perm Cath   DIALYSIS/PERMA CATHETER INSERTION N/A 04/26/2019  Procedure: DIALYSIS/PERMA CATHETER INSERTION;  Surgeon: Annice Needy, MD;  Location: [name] INVASIVE CV LAB;  Service: Cardiovascular;  Laterality: N/A;  DIALYSIS/PERMA CATHETER INSERTION N/A 05/25/2021  Procedure: DIALYSIS/PERMA CATHETER INSERTION;  Surgeon: Annice Needy, MD;  Location: [name] INVASIVE CV LAB;  Service: Cardiovascular;  Laterality: N/A;  DIALYSIS/PERMA CATHETER INSERTION N/A 08/31/2021  Procedure: DIALYSIS/PERMA CATHETER INSERTION;  Surgeon: Annice Needy, MD;  Location: [name] INVASIVE CV LAB;  Service: Cardiovascular;  Laterality: N/A;  DIALYSIS/PERMA CATHETER REMOVAL N/A 08/15/2019  Procedure: DIALYSIS/PERMA CATHETER REMOVAL;  Surgeon: Renford Dills, MD;  Location: [name] INVASIVE  CV LAB;  Service: Cardiovascular;  Laterality: N/A;  DIALYSIS/PERMA CATHETER REMOVAL N/A 03/15/2022  Procedure: DIALYSIS/PERMA CATHETER REMOVAL;  Surgeon: Annice Needy, MD;  Location: [name] INVASIVE CV LAB;  Service: Cardiovascular;  Laterality: N/A;  EMBOLIZATION (CATH LAB) Right 02/06/2021  Procedure: EMBOLIZATION;  Surgeon: Annice Needy, MD;  Location: [name] INVASIVE CV LAB;  Service: Cardiovascular;  Laterality: Right;  Right Upper Extremity Dialysis Access, Permcath Placement  ESOPHAGOGASTRODUODENOSCOPY (EGD) WITH PROPOFOL N/A 12/27/2017  Procedure: ESOPHAGOGASTRODUODENOSCOPY (EGD) WITH PROPOFOL;  Surgeon: Toledo, Boykin Nearing, MD;  Location: [name] ENDOSCOPY;  Service: Gastroenterology;  Laterality: N/A;  EYE SURGERY    IR THORACENTESIS ASP PLEURAL SPACE W/IMG GUIDE  05/05/2023  LEFT HEART CATH AND CORONARY ANGIOGRAPHY N/A 02/26/2020  Procedure: LEFT HEART CATH AND CORONARY ANGIOGRAPHY;  Surgeon: Corky Crafts, MD;  Location: [facility] INVASIVE CV LAB;  Service: Cardiovascular;  Laterality: N/A;  PROSTATE SURGERY  2016  REVISON OF ARTERIOVENOUS FISTULA Right 07/22/2022  Procedure: RESECTION OF ANEURYSMAL RIGHT ARM ARTERIOVENOUS FISTULA;  Surgeon: Annice Needy, MD;  Location: [name] ORS;  Service: Vascular;  Laterality: Right;  TONSILECTOMY/ADENOIDECTOMY WITH MYRINGOTOMY    TONSILLECTOMY    UPPER EXTREMITY ANGIOGRAPHY Right 11/26/2020  Procedure: Upper Extremity Angiography;  Surgeon: Annice Needy, MD;  Location: [name] INVASIVE CV LAB;  Service: Cardiovascular;  Laterality: Right;  UPPER EXTREMITY ANGIOGRAPHY Right 02/05/2021  Procedure: UPPER EXTREMITY ANGIOGRAPHY;  Surgeon: Annice Needy, MD;  Location: [name] INVASIVE CV LAB;  Service: Cardiovascular;  Laterality: Right; DeBlois, Riley Nearing 05/09/2023, 10:18 AM  DG Chest Port 1 View Result Date: 05/08/2023 CLINICAL DATA:  Short of breath. EXAM: PORTABLE CHEST 1 VIEW COMPARISON:  05/07/2023 and older studies. FINDINGS: Cardiac silhouette is mildly enlarged. Pigtail  chest tube projects at the right lateral lung base, unchanged. There is hazy opacity adjacent to the chest tube consistent with atelectasis. Relative volume loss noted on the right. Remainder of the right lung is clear. Mild streaky atelectasis at the medial left lung base. Remainder of the left lung is clear. Small right pleural effusion. No pneumothorax. Overall, no significant change from the previous day's study allowing for differences in patient positioning and technique. IMPRESSION: 1. No significant change from the previous day's study. 2. Right chest tube in place. No pneumothorax. 3. Small right pleural effusion and adjacent atelectasis. Electronically Signed   By: Amie Portland M.D.   On: 05/08/2023 09:23    Medications:  ampicillin-sulbactam (UNASYN) IV 3 g (05/08/23 1801)    amiodarone  100 mg Oral [id number]   ascorbic acid  1,000 mg Oral Daily   aspirin EC  81 mg Oral Daily   atorvastatin  80 mg Oral QHS   calcitRIOL  2.25 mcg Oral Q T,Th,Sat-1800   calcium acetate  1,334 mg Oral TID WC   carvedilol  12.5 mg Oral BID   cinacalcet  60 mg Oral Q T,Th,Sat-1800   dorzolamide-timolol  1 drop Right Eye BID   Gerhardt's butt cream   Topical BID   heparin  5,000 Units Subcutaneous Q8H   hydrocortisone  1 Application Rectal BID   insulin aspart  0-6 Units Subcutaneous TID WC   levETIRAcetam  500 mg Oral Once per day on Sunday Monday Wednesday Friday   levETIRAcetam  750 mg Oral Once per day on Tuesday Thursday Saturday   lidocaine-prilocaine  1 Application Topical Once per day on Monday Wednesday Friday   multivitamin  1 tablet Oral QHS   sodium chloride flush  10 mL Intrapleural Q8H   sodium chloride flush  3 mL Intravenous Q12H   sodium zirconium cyclosilicate  10 g Oral BID   zinc sulfate (50mg  elemental zinc)  220 mg Oral Daily    Dialysis Orders: [facility] TTS. 3 hours 15 minutes. EDW: 77.5 kg (however likely well under this as per outpatient unit). LUE AVF, 15-gauge  needles. Flow rates: 400/800. 37 degrees. 2K/2.5 calcium. UF profile #2. Heparin: Loading 1000 units, 400 units/h. Meds: Mircera 100 mcg every 2 weeks (last dose 3/4)   Assessment/Plan: 1. R pleural effusion: S/p thoracentesis with IR on 3/13 2. Dehiscence R AKA - Plan for revision 3/19 by [name]. Recommended giving 1 unit PRBCs with next HD to prepare for procedure which we will do tomorrow. See below. 3. ESRD: Typically on TTS schedule, short HD 3/14 and refused his regular HD 3/15. K+ 5.9 and wanted to dialyze today but he continues to refuse and prefers to stay on his routine schedule. Ordered Lokelma 10GM X 2. Next HD 3/18. 4. HTN/volume:  BP elevated, keep EDW low as tolerated to prevent recurrent effusion.  5. Anemia: S/p 1 unit PRBC on 3/13. Hgb 7.9. Last mircera dose on 04/26/23. Plan to give 1 unit PRBCs with HD tomorrow per Ortho's recommendations. See above. 6. Secondary hyperparathyroidism:  Calcium at goal, phos elevated. Continue calcium acetate, calcitriol and sensipar 7. Nutrition:  Alb low, continue protein supplements   Salome Holmes, NP [facility] 05/09/2023,10:43 AM  LOS: 4 days

## 2023-05-09 NOTE — [place] FL2 (Signed)
 [place] MEDICAID FL2 LEVEL OF CARE FORM     IDENTIFICATION  Patient Name: [name]. Birthdate: 21-Jan-1971 Sex: male Admission Date (Current Location): 05/05/2023  [place] and [place] Number:  [employer] and Address:  [facility] [place], [address] [place], [place] [address]      Provider Number: [id number]  Attending Physician Name and Address:  Lonia Blood, MD  Relative Name and Phone Number:  Delshon Blanchfield; Spouse; [phone]    Current Level of Care: Hospital Recommended Level of Care: Skilled Nursing Facility Prior Approval Number:    Date Approved/Denied:   PASRR Number: [id number]  Discharge Plan: SNF    Current Diagnoses: Patient Active Problem List   Diagnosis Date Noted   Pleural effusion 05/05/2023   Seizures (HCC) 05/05/2023   Right BKA infection (HCC) 04/04/2023   Pressure injury of skin 04/04/2023   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 02/26/2022   H/O enucleation of left eyeball 02/26/2022   PAD (peripheral artery disease) (HCC) 02/26/2022   History of seizure 02/26/2022   Seasonal allergic rhinitis 07/08/2021   Myoclonus 06/23/2021   Steal syndrome as complication of dialysis access (HCC) 02/06/2021   Coronary artery disease 02/05/2021   Dehiscence of amputation stump of right lower extremity (HCC) 01/29/2021   ESRD on hemodialysis (HCC)    Anemia due to chronic kidney disease, on chronic dialysis (HCC)    Paroxysmal atrial fibrillation (HCC) - not on systemic anticoagulation due  to ICH while on Eliquis in 11-2021. 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) 08/19/2020   Anxiety state 08/19/2020   Callosity 08/19/2020   ED (erectile dysfunction) of organic origin 08/19/2020   Long term (current) use of insulin (HCC) 08/19/2020   Myalgia 08/19/2020   CAD/ H/o prior STEMI 02/2020 s/p DES to LAD 08/19/2020   Blindness of left eye 08/15/2020   Combined forms of age-related cataract of right eye 04/17/2020   Moderate protein-calorie malnutrition (HCC) 05/14/2019   Disorder of phosphorus metabolism, unspecified 05/07/2019   Iron deficiency anemia, unspecified 05/07/2019   Secondary hyperparathyroidism of renal origin (HCC) 11/28/2018   Chronic Grade 2 diastolic heart failure/EF 55 to 60% 12/13/2017   Diabetes mellitus, type II (HCC) 11/23/2017   Diabetic retinopathy (HCC) 07/05/2017   Uncontrolled type 2 diabetes mellitus with hyperglycemia, with long-term current use of insulin (HCC) 04/20/2016   Mixed hyperlipidemia    Essential hypertension, benign     Orientation RESPIRATION BLADDER Height & Weight     Self, Time, Situation, Place  Normal (Room Air) Continent Weight: 171 lb 15.3 oz (78 kg) Height:  6\' 3"  (190.5 cm)  BEHAVIORAL SYMPTOMS/MOOD NEUROLOGICAL BOWEL NUTRITION STATUS    Convulsions/Seizures (Hx of seizures) Incontinent (Fecal Management System) Diet (Please see dc summary)  AMBULATORY STATUS COMMUNICATION OF NEEDS Skin   Extensive Assist Verbally Surgical wounds, Other (Comment) (Wound / Incision (Open or Dehisced) 05/09/23 Amputation Knee Proximal;Right AKA; Chest Tube 1 Lateral;Right Pleural 14 Fr.)  Personal Care Assistance Level of Assistance  Bathing, Feeding, Dressing Bathing Assistance: Maximum assistance Feeding assistance: Maximum assistance Dressing Assistance: Maximum assistance     Functional Limitations Info  Sight Sight Info: Impaired (R (Impaired) L (Blind))        SPECIAL CARE FACTORS  FREQUENCY  PT (By licensed PT), OT (By licensed OT)     PT Frequency: 5x OT Frequency: 5x            Contractures Contractures Info: Not present    Additional Factors Info  Code Status, Allergies, Isolation Precautions, Insulin Sliding Scale Code Status Info: Full Code Allergies Info: Promethazine   Insulin Sliding Scale Info: Please see dc summary Isolation Precautions Info: Enteric Precautions     Current Medications (05/09/2023):  This is the current hospital active medication list Current Facility-Administered Medications  Medication Dose Route Frequency Provider Last Rate Last Admin   acetaminophen (TYLENOL) tablet 650 mg  650 mg Oral Q6H PRN Shahmehdi, Seyed A, MD       amiodarone (PACERONE) tablet 100 mg  100 mg Oral BH-q7a Shahmehdi, Seyed A, MD   100 mg at 05/09/23 0840   Ampicillin-Sulbactam (UNASYN) 3 g in sodium chloride 0.9 % 100 mL IVPB  3 g Intravenous Q24H Jetty Duhamel T, MD 200 mL/hr at 05/08/23 1801 3 g at 05/08/23 1801   ascorbic acid (VITAMIN C) tablet 1,000 mg  1,000 mg Oral Daily Jetty Duhamel T, MD   1,000 mg at 05/09/23 1610   aspirin EC tablet 81 mg  81 mg Oral Daily Shahmehdi, Seyed A, MD   81 mg at 05/09/23 0840   atorvastatin (LIPITOR) tablet 80 mg  80 mg Oral QHS Shahmehdi, Seyed A, MD   80 mg at 05/08/23 2258   bisacodyl (DULCOLAX) EC tablet 5 mg  5 mg Oral Daily PRN Shahmehdi, Seyed A, MD       calcitRIOL (ROCALTROL) capsule 2.25 mcg  2.25 mcg Oral Q T,Th,Sat-1800 Shahmehdi, Seyed A, MD   2.25 mcg at 05/05/23 1751   calcium acetate (PHOSLO) capsule 1,334 mg  1,334 mg Oral TID WC Shahmehdi, Seyed A, MD   1,334 mg at 05/09/23 0846   carvedilol (COREG) tablet 12.5 mg  12.5 mg Oral BID Shahmehdi, Seyed A, MD   12.5 mg at 05/09/23 0841   Chlorhexidine Gluconate Cloth 2 % PADS 6 each  6 each Topical Q0600 Berenda Morale, NP   6 each at 05/09/23 1325   cinacalcet (SENSIPAR) tablet 60 mg  60 mg Oral Q T,Th,Sat-1800 Shahmehdi, Seyed A, MD   60 mg  at 05/05/23 1751   dorzolamide-timolol (COSOPT) 2-0.5 % ophthalmic solution 1 drop  1 drop Right Eye BID Nevin Bloodgood A, MD   1 drop at 05/09/23 9604   Gerhardt's butt cream   Topical BID Lonia Blood, MD   Given at 05/09/23 0842   heparin injection 5,000 Units  5,000 Units Subcutaneous Q8H Shahmehdi, Seyed A, MD   5,000 Units at 05/09/23 0636   hydrALAZINE (APRESOLINE) injection 10 mg  10 mg Intravenous Q4H PRN Shahmehdi, Guadalupe Maple A, MD   10 mg at 05/05/23 1341   hydrocortisone (ANUSOL-HC) 2.5 % rectal cream 1 Application  1 Application Rectal BID Nevin Bloodgood A, MD   1 Application at 05/08/23 2305   HYDROmorphone (DILAUDID) injection 0.5-1 mg  0.5-1 mg Intravenous Q2H PRN Shahmehdi, Guadalupe Maple A, MD   1 mg at 05/08/23 1801   insulin aspart (novoLOG) injection 0-6 Units  0-6 Units Subcutaneous TID  WC Shahmehdi, Seyed A, MD       ipratropium (ATROVENT) nebulizer solution 0.5 mg  0.5 mg Nebulization Q6H PRN Nevin Bloodgood A, MD       levalbuterol (XOPENEX) nebulizer solution 0.63 mg  0.63 mg Nebulization Q6H PRN Nevin Bloodgood A, MD       levETIRAcetam (KEPPRA) tablet 500 mg  500 mg Oral Once per day on Sunday Monday Wednesday Friday Nevin Bloodgood A, MD   500 mg at 05/08/23 1801   levETIRAcetam (KEPPRA) tablet 750 mg  750 mg Oral Once per day on Tuesday Thursday Saturday Kendell Bane, MD   750 mg at 05/05/23 1221   lidocaine-prilocaine (EMLA) cream 1 Application  1 Application Topical Once per day on Monday Wednesday Friday Kendell Bane, MD       loperamide (IMODIUM) capsule 2 mg  2 mg Oral PRN Lonia Blood, MD   2 mg at 05/08/23 2258   multivitamin (RENA-VIT) tablet 1 tablet  1 tablet Oral QHS Nevin Bloodgood A, MD   1 tablet at 05/08/23 2257   mupirocin ointment (BACTROBAN) 2 % 1 Application  1 Application Nasal BID Lonia Blood, MD       ondansetron [facility]) tablet 4 mg  4 mg Oral Q6H PRN Shahmehdi, Seyed A, MD       Or   ondansetron (ZOFRAN) injection 4 mg   4 mg Intravenous Q6H PRN Shahmehdi, Seyed A, MD       oxyCODONE (Oxy IR/ROXICODONE) immediate release tablet 5 mg  5 mg Oral Q4H PRN Shahmehdi, Seyed A, MD       senna-docusate (Senokot-S) tablet 1 tablet  1 tablet Oral QHS PRN Shahmehdi, Seyed A, MD       sodium chloride flush (NS) 0.9 % injection 10 mL  10 mL Intrapleural Q8H Lorin Glass, MD   10 mL at 05/09/23 3474   sodium chloride flush (NS) 0.9 % injection 3 mL  3 mL Intravenous Q12H Shahmehdi, Seyed A, MD   3 mL at 05/09/23 0847   sodium chloride flush (NS) 0.9 % injection 3-10 mL  3-10 mL Intravenous PRN Shahmehdi, Seyed A, MD       sodium zirconium cyclosilicate (LOKELMA) packet 10 g  10 g Oral BID Salome Holmes E, NP       zinc sulfate (50mg  elemental zinc) capsule 220 mg  220 mg Oral Daily Jetty Duhamel T, MD   220 mg at 05/09/23 0841   zolpidem (AMBIEN) tablet 5 mg  5 mg Oral QHS PRN,MR X 1 Shahmehdi, Seyed A, MD   5 mg at 05/08/23 2257     Discharge Medications: Please see discharge summary for a list of discharge medications.  Relevant Imaging Results:  Relevant Lab Results:   Additional Information SSN [id number]  //  [facility] Abshir Paolini, LCSW

## 2023-05-10 DIAGNOSIS — J90 Pleural effusion, not elsewhere classified: Secondary | ICD-10-CM | POA: Diagnosis not present

## 2023-05-10 LAB — CBC
HCT: 24.5 % — ABNORMAL LOW (ref 39.0–52.0)
Hemoglobin: 7.2 g/dL — ABNORMAL LOW (ref 13.0–17.0)
MCH: 21.2 pg — ABNORMAL LOW (ref 26.0–34.0)
MCHC: 29.4 g/dL — ABNORMAL LOW (ref 30.0–36.0)
MCV: 72.1 fL — ABNORMAL LOW (ref 80.0–100.0)
Platelets: 248 10*3/uL (ref 150–400)
RBC: 3.4 MIL/uL — ABNORMAL LOW (ref 4.22–5.81)
RDW: 22.6 % — ABNORMAL HIGH (ref 11.5–15.5)
WBC: 8.3 10*3/uL (ref 4.0–10.5)
nRBC: 0 % (ref 0.0–0.2)

## 2023-05-10 LAB — CULTURE, BLOOD (ROUTINE X 2)
Culture: NO GROWTH
Culture: NO GROWTH
Special Requests: ADEQUATE

## 2023-05-10 LAB — RENAL FUNCTION PANEL
Albumin: 1.9 g/dL — ABNORMAL LOW (ref 3.5–5.0)
Anion gap: 14 (ref 5–15)
BUN: 83 mg/dL — ABNORMAL HIGH (ref 6–20)
CO2: 19 mmol/L — ABNORMAL LOW (ref 22–32)
Calcium: 8.4 mg/dL — ABNORMAL LOW (ref 8.9–10.3)
Chloride: 105 mmol/L (ref 98–111)
Creatinine, Ser: 13.39 mg/dL — ABNORMAL HIGH (ref 0.61–1.24)
GFR, Estimated: 4 mL/min — ABNORMAL LOW (ref >60)
Glucose, Bld: 70 mg/dL (ref 70–99)
Phosphorus: 10.3 mg/dL — ABNORMAL HIGH (ref 2.5–4.6)
Potassium: 6 mmol/L — ABNORMAL HIGH (ref 3.5–5.1)
Sodium: 138 mmol/L (ref 135–145)

## 2023-05-10 LAB — CULTURE, BODY FLUID W GRAM STAIN -BOTTLE: Culture: NO GROWTH

## 2023-05-10 LAB — GLUCOSE, CAPILLARY: Glucose-Capillary: 76 mg/dL (ref 70–99)

## 2023-05-10 LAB — PREPARE RBC (CROSSMATCH)

## 2023-05-10 MED ORDER — POVIDONE-IODINE 10 % EX SWAB: 2.0000 | Freq: Once | CUTANEOUS | Status: DC

## 2023-05-10 MED ORDER — CEFAZOLIN SODIUM-DEXTROSE 2-4 GM/100ML-% IV SOLN
2.0000 g | INTRAVENOUS | Status: AC
  Administered 2023-05-11: 2 g via INTRAVENOUS
  Filled 2023-05-10: qty 100

## 2023-05-10 MED ORDER — TRANEXAMIC ACID-NACL 1000-0.7 MG/100ML-% IV SOLN
1000.0000 mg | INTRAVENOUS | Status: DC
  Filled 2023-05-10: qty 100

## 2023-05-10 MED ORDER — TRANEXAMIC ACID 1000 MG/10ML IV SOLN
2000.0000 mg | INTRAVENOUS | Status: DC
  Filled 2023-05-10: qty 20

## 2023-05-10 MED ORDER — SODIUM CHLORIDE 0.9% IV SOLUTION: Freq: Once | INTRAVENOUS | Status: AC

## 2023-05-10 MED ORDER — CHLORHEXIDINE GLUCONATE 4 % EX SOLN
60.0000 mL | Freq: Once | CUTANEOUS | Status: DC
  Filled 2023-05-10: qty 60

## 2023-05-10 NOTE — Progress Notes (Addendum)
   05/10/23 1203  Vitals  Temp 97.8 F (36.6 C)  Pulse Rate 62  Resp (!) 9  BP (!) 158/53  SpO2 98 %  O2 Device Room Air  Weight 69 kg  Type of Weight Post-Dialysis  Oxygen Therapy  Patient Activity (if Appropriate) In bed  Pulse Oximetry Type Continuous  Oximetry Probe Site Changed No  Post Treatment  Dialyzer Clearance Lightly streaked  Hemodialysis Intake (mL) 700 mL  Liters Processed 34.2  Tolerated HD Treatment Yes  Post-Hemodialysis Comments Pt received fluid boluses during treatment. Fluid was not removed during treatment.Pt requested to end treatment early with 1 hour and 11 minutes of treatment remaining.    Received patient in bed to unit.  Alert and oriented.  Informed consent signed and in chart.   TX duration: 2 hours and 19 minutes  Patient tolerated well.  Transported back to the room  Alert, without acute distress.  Hand-off given to patient's nurse.   Access used: Left upper arm fistula Access issues: none

## 2023-05-10 NOTE — Progress Notes (Signed)
 [name].  ZOX:[id number] DOB: December 26, 1970 DOA: 05/05/2023 PCP: [name], FNP    Brief Narrative:  53 year old resident of [facility] with a medical history of ESRD on HD TTS, recent right BKA ([name]), CVA, atrial fibrillation not on anticoagulation, DM2, and seizure disorder who presented to the [facility] ER with seizure-like activity which occurred during dialysis.  While in the ER the patient was discovered to have a large right pleural effusion.  Additionally, thinning of skin of his right stump was noted with a small area of focal ulceration.  The decision was made to transfer him to [facility] where he could be seen by [name] for possible revision of his right BKA stump.  PCCM was consulted to consider a possible thoracentesis.  Significant Events: 3/13 presented to [facility] ED - transferred to [facility]  3/13 thoracentesis in IR producing ~1.5L  3/14 inpatient HD  3/14 pigtail chest tube place by PCCM for large persisting effusion s/p thora 3/15 lytics instilled per chest tube by PCCM 3/17 chest tube removed at bedside  Goals of Care:   Code Status: Full Code   DVT prophylaxis: heparin injection 5,000 Units Start: 05/05/23 2200 TED hose Start: 05/05/23 1023 SCDs Start: 05/05/23 1023   Interim Hx: Chest tube removed yesterday.  No acute events reported overnight.  Afebrile.  No shortness of breath or chest pain today.  Reports discomfort from rectal tube.  No other new complaints.  Assessment & Plan:  Large right pleural effusion  Thoracentesis in IR 3/13 producing 1.5 L of hazy amber fluid - felt to likely represent a parapneumonic effusion due to chronic aspiration, versus possible trauma related with history of recent falls per patient - Unasyn empirically initiated - CT chest revealed a very large persisting right effusion - pigtail chest tube placed by pulmonary 3/14 with improved drainage - lytics infused per chest tube 3/15 -essentially resolved with follow-up  chest x-ray noting no significant persisting effusion -chest tube removed 3/17 -cytology of pleural fluid without evidence of malignant cells -routine follow-up CXR in a.m. to assure that effusion has not begun to reaccumulate  Status post right BKA 02/09/2023 with stump ulceration care per [name] - no evidence to suggest underlying osteomyelitis on plain films of the BKA site -[name] plans revision of right above-knee on 3/19   Seizure disorder - suspected breakthrough seizure Neurology suggest continuing Keppra 500 mg daily with additional 250 mg on dialysis days after dialysis - clonazepam used at 0.5 mg twice daily for 3 days as a bridge while underlying infection being corrected -quiescent  ESRD on HD TTS Care per Nephrology - refused dialysis 3/15 but numbers appear stable therefore no acute requirement -for dialysis today on schedule  Anemia of CKD Hemoglobin declined following admission to nadir of 6.8 - transfused 1 unit PRBC 3/14 -no evidence of gross blood loss -hemoglobin stable presently -to receive 1 unit PRBC with HD today in preparation for Orthopedic Surgery tomorrow  Chronic loose stools Utilizing rectal tube for now but is becoming uncomfortable -plan will be to remove rectal tube after his surgery is completed 3/19  Chronic grade 2 diastolic congestive heart failure with EF 55-60% Volume management per dialysis -no gross overload presently  CAD status post DES to LAD 2022 Asymptomatic presently -continue usual beta-blocker statin and aspirin  Chronic atrial fibrillation Not on anticoagulation -continue amiodarone-well-controlled at present  Uncontrolled DM2 with hyperglycemia CBG improved since admission -monitor trend  HTN Blood pressure well-controlled  at present  HLD Continue usual Lipitor  Family Communication:  Disposition: Anticipate discharge home late in week after AKA revision   Objective: Blood pressure (!) 120/49, pulse (!) 56, temperature  97.9 F (36.6 C), resp. rate (!) 9, height 6\' 3"  (1.905 m), weight 67.5 kg, SpO2 96%.  Intake/Output Summary (Last 24 hours) at 05/10/2023 1036 Last data filed at 05/10/2023 0114 Gross per 24 hour  Intake --  Output 0 ml  Net 0 ml   Filed Weights   05/06/23 0906 05/06/23 1130 05/10/23 0833  Weight: 80 kg 78 kg 67.5 kg    Examination: General: No acute respiratory distress Lungs: Clear to auscultation bilaterally -no wheeze Cardiovascular: RRR without murmur Abdomen: NT/ND, soft, bs+, no mass  Extremities: No significant cyanosis, clubbing, or edema   CBC: Recent Labs  Lab 05/05/23 0804 05/05/23 1124 05/06/23 0904 05/06/23 2011 05/07/23 0332 05/09/23 0755  WBC 8.1   < > 7.3  --  7.5 9.5  NEUTROABS 5.3  --   --   --   --   --   HGB 7.1*   < > 6.8* 8.3* 7.8* 7.9*  HCT 25.0*   < > 23.0* 26.9* 25.4* 26.4*  MCV 71.8*   < > 71.9*  --  72.0* 71.2*  PLT 249   < > 237  --  235 243   < > = values in this interval not displayed.   Basic Metabolic Panel: Recent Labs  Lab 05/05/23 0804 05/06/23 0615 05/06/23 0904 05/07/23 0332 05/09/23 0755  NA 137   < > 139 138 138  K 5.4*   < > 6.0* 5.3* 5.9*  CL 103   < > 107 102 104  CO2 20*   < > 21* 24 21*  GLUCOSE 92   < > 72 71 83  BUN 53*   < > 61* 46* 73*  CREATININE 8.93*   < > 10.95* 8.43* 11.74*  CALCIUM 9.1   < > 8.8* 8.6* 8.5*  MG 2.4  --   --   --   --   PHOS 6.8*  --  8.1* 7.0* 9.5*   < > = values in this interval not displayed.   GFR: Estimated Creatinine Clearance: 6.8 mL/min (A) (by C-G formula based on SCr of 11.74 mg/dL (H)).   Scheduled Meds:  sodium chloride   Intravenous Once   amiodarone  100 mg Oral BH-q7a   ascorbic acid  1,000 mg Oral Daily   aspirin EC  81 mg Oral Daily   atorvastatin  80 mg Oral QHS   calcitRIOL  2.25 mcg Oral Q T,Th,Sat-1800   calcium acetate  1,334 mg Oral TID WC   carvedilol  12.5 mg Oral BID   Chlorhexidine Gluconate Cloth  6 each Topical Q0600   cinacalcet  60 mg Oral Q  T,Th,Sat-1800   dorzolamide-timolol  1 drop Right Eye BID   Gerhardt's butt cream   Topical BID   heparin  5,000 Units Subcutaneous Q8H   hydrocortisone  1 Application Rectal BID   insulin aspart  0-6 Units Subcutaneous TID WC   levETIRAcetam  500 mg Oral Once per day on Sunday Monday Wednesday Friday   levETIRAcetam  750 mg Oral Once per day on Tuesday Thursday Saturday   lidocaine-prilocaine  1 Application Topical Once per day on Monday Wednesday Friday   multivitamin  1 tablet Oral QHS   mupirocin ointment  1 Application Nasal BID   sodium chloride flush  10  mL Intrapleural Q8H   sodium chloride flush  3 mL Intravenous Q12H   zinc sulfate (50mg  elemental zinc)  220 mg Oral Daily     LOS: 5 days   Lonia Blood, MD [facility] Office  [phone] Pager - Text Page per Amion  If 7PM-7AM, please contact night-coverage per Amion 05/10/2023, 10:36 AM

## 2023-05-10 NOTE — Progress Notes (Addendum)
 [facility] Progress Note   Subjective:    Seen and examined patient on HD. Patient c/o blurred vision and noted SBPs in 80s. Time lowered to 3 hrs. Ordered to hold UF and give NS bolus. Informed NS bolus given earlier. Ordered 1 unit PRBCs to be given with HD per Ortho recommendations. Plan for revision of R AKA tomorrow by [name].  Objective Vitals:   05/10/23 0546 05/10/23 0833 05/10/23 0859 05/10/23 0947  BP: (!) 124/57 (!) 125/47 (!) 120/49 (!) 85/49  Pulse: 60 (!) 58 (!) 56 (!) 46  Resp: 18 11 (!) 9 (!) 8  Temp: 98.1 F (36.7 C) 97.9 F (36.6 C) 97.9 F (36.6 C)   TempSrc: Oral     SpO2: 99% 99% 96% 97%  Weight:  67.5 kg    Height:       Physical Exam General: Alert male in NAD Heart: RRR, no murmur Lungs: Respirations unlabored on RA, CTA anteriorly Abdomen: Non-distended Extremities: No peripheral edema Dialysis Access: AVF (+) B/T  Filed Weights   05/06/23 0906 05/06/23 1130 05/10/23 0833  Weight: 80 kg 78 kg 67.5 kg    Intake/Output Summary (Last 24 hours) at 05/10/2023 1045 Last data filed at 05/10/2023 0114 Gross per 24 hour  Intake --  Output 0 ml  Net 0 ml    Additional Objective Labs: Basic Metabolic Panel: Recent Labs  Lab 05/07/23 0332 05/09/23 0755 05/10/23 0839  NA 138 138 138  K 5.3* 5.9* 6.0*  CL 102 104 105  CO2 24 21* 19*  GLUCOSE 71 83 70  BUN 46* 73* 83*  CREATININE 8.43* 11.74* 13.39*  CALCIUM 8.6* 8.5* 8.4*  PHOS 7.0* 9.5* 10.3*   Liver Function Tests: Recent Labs  Lab 05/05/23 0804 05/06/23 0904 05/07/23 0332 05/09/23 0755 05/10/23 0839  AST 13*  --   --   --   --   ALT 10  --   --   --   --   ALKPHOS 58  --   --   --   --   BILITOT 0.6  --   --   --   --   PROT 6.5  --   --   --   --   ALBUMIN 2.3*   < > 1.9* 1.9* 1.9*   < > = values in this interval not displayed.   No results for input(s): "LIPASE", "AMYLASE" in the last 168 hours. CBC: Recent Labs  Lab 05/05/23 0804  05/05/23 1124 05/06/23 0615 05/06/23 0904 05/06/23 2011 05/07/23 0332 05/09/23 0755 05/10/23 0839  WBC 8.1  --  7.6 7.3  --  7.5 9.5 8.3  NEUTROABS 5.3  --   --   --   --   --   --   --   HGB 7.1*   < > 7.1* 6.8*   < > 7.8* 7.9* 7.2*  HCT 25.0*   < > 23.7* 23.0*   < > 25.4* 26.4* 24.5*  MCV 71.8*  --  70.7* 71.9*  --  72.0* 71.2* 72.1*  PLT 249  --  232 237  --  235 243 248   < > = values in this interval not displayed.   Blood Culture    Component Value Date/Time   SDES FLUID PLEURAL RIGHT 05/05/2023 1443   SDES FLUID PLEURAL RIGHT 05/05/2023 1443   SPECREQUEST BOTTLES DRAWN AEROBIC AND ANAEROBIC 05/05/2023 1443   SPECREQUEST NONE 05/05/2023 1443   CULT  05/05/2023  1443    NO GROWTH 5 DAYS Performed at [facility], [address] [place], [place] [address]    REPTSTATUS 05/10/2023 FINAL 05/05/2023 1443   REPTSTATUS 05/05/2023 FINAL 05/05/2023 1443    Cardiac Enzymes: No results for input(s): "CKTOTAL", "CKMB", "CKMBINDEX", "TROPONINI" in the last 168 hours. CBG: Recent Labs  Lab 05/05/23 2053 05/06/23 0803 05/09/23 0838 05/09/23 2051 05/10/23 0711  GLUCAP 106* 74 77 101* 76   Iron Studies: No results for input(s): "IRON", "TIBC", "TRANSFERRIN", "FERRITIN" in the last 72 hours. Lab Results  Component Value Date   INR 1.2 05/06/2023   INR 1.2 05/05/2023   INR 1.1 03/01/2022   Studies/Results: DG Chest Port 1 View Result Date: 05/09/2023 CLINICAL DATA:  [id number].  Pleural effusion. EXAM: PORTABLE CHEST 1 VIEW COMPARISON:  Portable chest yesterday at 6:19 a.m. FINDINGS: 4:09 p.m. Pigtail pleural tube in the lower lateral right chest remains stable in positioning with adjacent right lateral basal opacity and overlying linear atelectasis. Streak atelectasis or infiltrate in the left lower lobe retrocardiac space is also unchanged. The remainder of the lungs are clear. There is stable cardiomegaly without evidence of CHF. Coronary artery stent again noted on left  stable mediastinal of aeration. No new osseous abnormality. IMPRESSION: Stable overall aeration and chest tube positioning. No measurable pneumothorax. Electronically Signed   By: Almira Bar M.D.   On: 05/09/2023 20:06   DG Swallowing Func-Speech Pathology Result Date: 05/09/2023 Table formatting from the original result was not included. Modified Barium Swallow Study Patient Details Name: [name]. MRN: [id number] Date of Birth: 10-03-1970 Today's Date: 05/09/2023 HPI/PMH: HPI: [name] is a 53 year old resident of [facility] who presented to the [facility] ER with seizure-like activity which occurred during dialysis.  While in the ER the patient was discovered to have a large right pleural effusion. S/p chest tube by PCCM and concern for aspiration. CXR 3/15 with RLL consolidation concerning for atelectasis v pna. Pt with a medical history of ESRD on HD TTS, recent right BKA ([name]), CVA, atrial fibrillation not on anticoagulation, DM2, and seizure disorder Clinical Impression: Clinical Impression: Pt demonstrates normal swallow function. No penetration, aspiraiton or residue. No significant risk for postprandial aspiration identified with pt interview and esopahgeal sweep appeared WNL.  SLP will sign off. Factors that may increase risk of adverse event in presence of aspiration [name] & [name] 2021): No data recorded Recommendations/Plan: Swallowing Evaluation Recommendations Swallowing Evaluation Recommendations Recommendations: PO diet PO Diet Recommendation: Regular; Thin liquids (Level 0) Liquid Administration via: Cup; Straw Medication Administration: Whole meds with liquid Supervision: Patient able to self-feed Treatment Plan Treatment Plan Treatment recommendations: No treatment recommended at this time Recommendations Recommendations for follow up therapy are one component of a multi-disciplinary discharge planning process, led by the attending physician.  Recommendations may be  updated based on patient status, additional functional criteria and insurance authorization. Assessment: Orofacial Exam: No data recorded Anatomy: Anatomy: WFL Boluses Administered: Boluses Administered Boluses Administered: Thin liquids (Level 0); Puree; Solid  Oral Impairment Domain: Oral Impairment Domain Lip Closure: No labial escape Tongue control during bolus hold: Not tested Bolus preparation/mastication: Slow prolonged chewing/mashing with complete recollection Bolus transport/lingual motion: Brisk tongue motion Oral residue: Complete oral clearance Location of oral residue : N/A Initiation of pharyngeal swallow : Pyriform sinuses  Pharyngeal Impairment Domain: Pharyngeal Impairment Domain Soft palate elevation: No bolus between soft palate (SP)/pharyngeal wall (PW) Laryngeal elevation: Complete superior movement of thyroid cartilage with complete approximation of  arytenoids to epiglottic petiole Anterior hyoid excursion: Complete anterior movement Epiglottic movement: Complete inversion Laryngeal vestibule closure: Complete, no air/contrast in laryngeal vestibule Tongue base retraction: No contrast between tongue base and posterior pharyngeal wall (PPW) Pharyngeal residue: Complete pharyngeal clearance Location of pharyngeal residue: N/A  Esophageal Impairment Domain: Esophageal Impairment Domain Esophageal clearance upright position: Complete clearance, esophageal coating Pill: Pill Consistency administered: Thin liquids (Level 0) Penetration/Aspiration Scale Score: Penetration/Aspiration Scale Score 1.  Material does not enter airway: Thin liquids (Level 0); Puree; Solid; Pill Compensatory Strategies: Compensatory Strategies Compensatory strategies: No   General Information: Caregiver present: No  Diet Prior to this Study: Regular; Thin liquids (Level 0)   Temperature : Normal   No data recorded  Supplemental O2: None (Room air)   History of Recent Intubation: No  Behavior/Cognition: Alert; Cooperative  Self-Feeding Abilities: Able to self-feed Baseline vocal quality/speech: Normal Volitional Cough: Able to elicit Volitional Swallow: Unable to elicit No data recorded Goal Planning: No data recorded No data recorded No data recorded No data recorded No data recorded Pain: No data recorded End of Session: Start Time:SLP Start Time (ACUTE ONLY): 0935 Stop Time: SLP Stop Time (ACUTE ONLY): 1000 Time Calculation:SLP Time Calculation (min) (ACUTE ONLY): 25 min Charges: SLP Evaluations $ SLP Speech Visit: 1 Visit SLP Evaluations $MBS Swallow: 1 Procedure SLP visit diagnosis: SLP Visit Diagnosis: Dysphagia, unspecified (R13.10) Past Medical History: Past Medical History: Diagnosis Date  Acute Bil  CVAs of Bil Frontal amd Rt Parietal  in Watershed Distribution 04/21/2021  Patchy acute cortically-based infarcts within the bilateral high  frontal lobes and right parietal lobe (predominantly in a watershed  distribution).   Subcentimeter acute infarct within the callosal splenium on the  left.    Acute bilateral cerebral infarction in a watershed distribution [facility]) 04/21/2021  a.) MRI brain 04/21/2021 --> patchy ACUTE cortically-based infarcts within the bilateral high frontal lobes and right parietal lobe  Acute cerebral infarction (HCC) 02/13/2021  a.) MRI brain 02/13/2021: acute LEFT hippocampal infarct  Acute cerebral infarction (HCC) 04/21/2021  a.) MRI brain 04/21/2021: subcentimeter ACUTE infarct within the callosal splenium on the LEFT  Acute osteomyelitis of toe of right foot with gangrene (HCC) 02/26/2022  Anaphylactic reaction due to adverse effect of correct drug or medicament properly administered, initial encounter 05/08/2019  Anemia of chronic renal failure   Anxiety   Aortic dilatation (HCC) 11/25/2020  a.) TTE 11/25/2020: Ao root measured 38 mm. b.) TTE 04/22/2021: Ao root measured 44 mm; c. 11/2021 Echo: Ao root 40mm.  Atrial fibrillation (HCC)   a.) CHA2DS2-VASc = 6 (CHF, HTN, CVA x2, prior MI, T2DM). b.)  rate/rhythm maintained on oral amiodarone + carvedilol; chronic OAC/AP therapy discontinued following ICH  Benign prostatic hyperplasia with lower urinary tract symptoms 11/07/2019  BPH (benign prostatic hyperplasia)   Cardiac arrest (HCC) 07/26/2021  a.) in setting of hyperkalemia, NSTEMI, and seizure following missed HD; pulseless and apneic --> required 1 round of CPR prior to ROSC  Cellulitis   Cerebral hemorrhage (HCC) 11/21/2021  a.) CT head 11/21/2021 --> 1.4 x 1.5 x 3.1 cm ACUTE hemorrhage within the inferior aspect of the fourth ventricle, and extending inferiorly through the foramen of foramen of [place] --> occurred in setting of HTN emergency and DOAC/APT-->eliquis/plavix d/c'd.  Cerebral microvascular disease   Chronic diarrhea   Chronic heart failure with preserved ejection fraction (HFpEF) (HCC) 07/19/2017  a.) 06/2017 Echo: EF 75%; b.) 11/2020 Echo: EF 55-60%; c.) 04/2021 Echo: EF 55-60%; d.) 11/2021 Echo: EF 65-70%, GrII  DD, nl RV fxn, sev dil LA, large circumferential pericardial eff w/o tamponade, triv MR, AoV sclerosis. Ao root 40mm; e.) TTE 02/28/2025: EF 60-65%, sev LVH, mod posterior LV effusion, AoV sclerosis  Chronic pain of both ankles   Coronary artery disease 02/26/2020  a. 02/2020 Ant STEMI/PCI: LM nl, LAD 48m (3.5x30 Resolute Onyx), RI nl, LCX min irregs, RCA min irregs. EF 65%.  DDD (degenerative disc disease), cervical   Diabetes mellitus, type II (HCC)   Diabetic neuropathy (HCC)   Esophagitis   ESRD (end stage renal disease) on dialysis (HCC)   a.) [facility]; T-Th-Sat  Frequent falls   Gait instability   Gastritis   Gastroparesis   GERD (gastroesophageal reflux disease)   H/O enucleation of left eyeball   Heart palpitations   History of kidney stones   HLD (hyperlipidemia)   Hypertension   Insomnia   a.) uses melatonin PRN  Long term current use of amiodarone   Nausea and vomiting in adult   recurrent  NSTEMI (non-ST elevated myocardial infarction) (HCC) 07/26/2021  a.) hyperkalemic from  missed HD; seizures; decompensated to cardiac arrest and required CPR x 1 round prior to ROSC.  NVG (neovascular glaucoma), left, 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  Formatting of this note might be different from the original.  Added automatically from request for surgery [id number]    Osteomyelitis [facility])   Pericardial effusion   a. 11/2021 Echo: EF 65-70%, GrII DD, nl RV fxn, sev dil LA, large circumferential pericardial eff w/o tamponade, triv MR, AoV sclerosis. Ao root 40mm.  Seizure (HCC)   a.) last 07/26/2021 in setting of missed HD --> hyperkalemic at 6.5 --> pulseless/apneic and required CPR; discharged home on levetiracetam.  ST elevation myocardial infarction (STEMI) of anterior wall (HCC) 02/26/2020  a.) LHC/PCI 02/26/2020 --> EF 65%; LVEDP 11 mmHg; 90% mLAD (3.5 x 20 mm Resolute Onyx DES x 1) Past Surgical History: Past Surgical History: Procedure Laterality Date  A/V FISTULAGRAM Left 08/31/2021  Procedure: A/V Fistulagram;  Surgeon: Annice Needy, MD;  Location: [name] INVASIVE CV LAB;  Service: Cardiovascular;  Laterality: Left;  A/V FISTULAGRAM Left 01/25/2022  Procedure: A/V Fistulagram;  Surgeon: Annice Needy, MD;  Location: [name] INVASIVE CV LAB;  Service: Cardiovascular;  Laterality: Left;  A/V FISTULAGRAM Right 10/18/2022  Procedure: A/V Fistulagram;  Surgeon: Annice Needy, MD;  Location: [name] INVASIVE CV LAB;  Service: Cardiovascular;  Laterality: Right;  ABDOMINAL AORTOGRAM W/LOWER EXTREMITY Bilateral 02/08/2023  Procedure: ABDOMINAL AORTOGRAM W/LOWER EXTREMITY;  Surgeon: Daria Pastures, MD;  Location: [facility] INVASIVE CV LAB;  Service: Vascular;  Laterality: Bilateral;  AMPUTATION Left 03/16/2016  Procedure: AMPUTATION DIGIT LEFT HALLUX;  Surgeon: Vivi Barrack, DPM;  Location: [facility] OR;  Service: Podiatry;  Laterality: Left;  can start around 5   AMPUTATION Right 02/09/2021  Procedure: AMPUTATION DIGIT;  Surgeon: Marlyne Beards, MD;  Location: [facility] OR;  Service: Orthopedics;  Laterality: Right;  AMPUTATION  Right 02/04/2023  Procedure: AMPUTATION 4TH AND 5TH METATARSAL;  Surgeon: Pilar Plate, DPM;  Location: [facility] OR;  Service: Orthopedics/Podiatry;  Laterality: Right;  I&D, partial ray resection, abx beads  AMPUTATION Right 02/09/2023  Procedure: RIGHT BELOW KNEE AMPUTATION;  Surgeon: [name], MD;  Location[facility] OR;  Service: Orthopedics;  Laterality: Right;  AMPUTATION Right 04/06/2023  Procedure: RIGHT ABOVE KNEE AMPUTATION;  Surgeon: [name], MD;  Location: [facility] OR;  Service: Orthopedics;  Laterality: Right;  AMPUTATION TOE Right 02/28/2022  Procedure: AMPUTATION TOE;  Surgeon: Louann Sjogren, DPM;  Location: [name] ORS;  Service: Podiatry;  Laterality: Right;  right fifth toe  ARTHROSCOPIC REPAIR ACL Left   AV FISTULA PLACEMENT Right 05/31/2019  Procedure: Brachiocephalic AV fistula creation;  Surgeon: Annice Needy, MD;  Location: [name] ORS;  Service: Vascular;  Laterality: Right;  AV FISTULA PLACEMENT Left 08/13/2021  Procedure: ARTERIOVENOUS (AV) FISTULA CREATION (BRACHIALCEPHALIC);  Surgeon: Annice Needy, MD;  Location: [name] ORS;  Service: Vascular;  Laterality: Left;  COLONOSCOPY WITH PROPOFOL N/A 10/28/2015  Procedure: COLONOSCOPY WITH PROPOFOL;  Surgeon: Christena Deem, MD;  Location: [facility] ENDOSCOPY;  Service: Endoscopy;  Laterality: N/A;  COLONOSCOPY WITH PROPOFOL N/A 10/29/2015  Procedure: COLONOSCOPY WITH PROPOFOL;  Surgeon: Christena Deem, MD;  Location: [facility] ENDOSCOPY;  Service: Endoscopy;  Laterality: N/A;  COLONOSCOPY WITH PROPOFOL N/A 01/27/2023  Procedure: COLONOSCOPY WITH PROPOFOL;  Surgeon: Franky Macho, MD;  Location: [facility] ENDO SUITE;  Service: Endoscopy;  Laterality: N/A;  12:30pm, asa 3  CORONARY ULTRASOUND/IVUS N/A 02/26/2020  Procedure: Intravascular Ultrasound/IVUS;  Surgeon: Corky Crafts, MD;  Location: [facility] INVASIVE CV LAB;  Service: Cardiovascular;  Laterality: N/A;  CORONARY/GRAFT ACUTE MI REVASCULARIZATION N/A 02/26/2020  Procedure: Coronary/Graft Acute MI  Revascularization;  Surgeon: Corky Crafts, MD;  Location: [facility] INVASIVE CV LAB;  Service: Cardiovascular;  Laterality: N/A;  DIALYSIS/PERMA CATHETER INSERTION Right 04/26/2019  Perm Cath   DIALYSIS/PERMA CATHETER INSERTION N/A 04/26/2019  Procedure: DIALYSIS/PERMA CATHETER INSERTION;  Surgeon: Annice Needy, MD;  Location: [name] INVASIVE CV LAB;  Service: Cardiovascular;  Laterality: N/A;  DIALYSIS/PERMA CATHETER INSERTION N/A 05/25/2021  Procedure: DIALYSIS/PERMA CATHETER INSERTION;  Surgeon: Annice Needy, MD;  Location: [name] INVASIVE CV LAB;  Service: Cardiovascular;  Laterality: N/A;  DIALYSIS/PERMA CATHETER INSERTION N/A 08/31/2021  Procedure: DIALYSIS/PERMA CATHETER INSERTION;  Surgeon: Annice Needy, MD;  Location: [name] INVASIVE CV LAB;  Service: Cardiovascular;  Laterality: N/A;  DIALYSIS/PERMA CATHETER REMOVAL N/A 08/15/2019  Procedure: DIALYSIS/PERMA CATHETER REMOVAL;  Surgeon: Renford Dills, MD;  Location: [name] INVASIVE CV LAB;  Service: Cardiovascular;  Laterality: N/A;  DIALYSIS/PERMA CATHETER REMOVAL N/A 03/15/2022  Procedure: DIALYSIS/PERMA CATHETER REMOVAL;  Surgeon: Annice Needy, MD;  Location: [name] INVASIVE CV LAB;  Service: Cardiovascular;  Laterality: N/A;  EMBOLIZATION (CATH LAB) Right 02/06/2021  Procedure: EMBOLIZATION;  Surgeon: Annice Needy, MD;  Location: [name] INVASIVE CV LAB;  Service: Cardiovascular;  Laterality: Right;  Right Upper Extremity Dialysis Access, Permcath Placement  ESOPHAGOGASTRODUODENOSCOPY (EGD) WITH PROPOFOL N/A 12/27/2017  Procedure: ESOPHAGOGASTRODUODENOSCOPY (EGD) WITH PROPOFOL;  Surgeon: Tol[place], Boykin Nearing, MD;  Location: [name] ENDOSCOPY;  Service: Gastroenterology;  Laterality: N/A;  EYE SURGERY    IR THORACENTESIS ASP PLEURAL SPACE W/IMG GUIDE  05/05/2023  LEFT HEART CATH AND CORONARY ANGIOGRAPHY N/A 02/26/2020  Procedure: LEFT HEART CATH AND CORONARY ANGIOGRAPHY;  Surgeon: Corky Crafts, MD;  Location: [facility] INVASIVE CV LAB;  Service: Cardiovascular;   Laterality: N/A;  PROSTATE SURGERY  2016  REVISON OF ARTERIOVENOUS FISTULA Right 07/22/2022  Procedure: RESECTION OF ANEURYSMAL RIGHT ARM ARTERIOVENOUS FISTULA;  Surgeon: Annice Needy, MD;  Location: [name] ORS;  Service: Vascular;  Laterality: Right;  TONSILECTOMY/ADENOIDECTOMY WITH MYRINGOTOMY    TONSILLECTOMY    UPPER EXTREMITY ANGIOGRAPHY Right 11/26/2020  Procedure: Upper Extremity Angiography;  Surgeon: Annice Needy, MD;  Location: [name] INVASIVE CV LAB;  Service: Cardiovascular;  Laterality: Right;  UPPER EXTREMITY ANGIOGRAPHY Right 02/05/2021  Procedure: UPPER EXTREMITY ANGIOGRAPHY;  Surgeon: Annice Needy, MD;  Location: [name] INVASIVE CV LAB;  Service: Cardiovascular;  Laterality: Right; [place], Riley Nearing 05/09/2023,  10:18 AM   Medications:   sodium chloride   Intravenous Once   amiodarone  100 mg Oral [id number]   ascorbic acid  1,000 mg Oral Daily   aspirin EC  81 mg Oral Daily   atorvastatin  80 mg Oral QHS   calcitRIOL  2.25 mcg Oral Q T,Th,Sat-1800   calcium acetate  1,334 mg Oral TID WC   carvedilol  12.5 mg Oral BID   Chlorhexidine Gluconate Cloth  6 each Topical Q0600   cinacalcet  60 mg Oral Q T,Th,Sat-1800   dorzolamide-timolol  1 drop Right Eye BID   Gerhardt's butt cream   Topical BID   heparin  5,000 Units Subcutaneous Q8H   hydrocortisone  1 Application Rectal BID   insulin aspart  0-6 Units Subcutaneous TID WC   levETIRAcetam  500 mg Oral Once per day on Sunday Monday Wednesday Friday   levETIRAcetam  750 mg Oral Once per day on Tuesday Thursday Saturday   lidocaine-prilocaine  1 Application Topical Once per day on Monday Wednesday Friday   multivitamin  1 tablet Oral QHS   mupirocin ointment  1 Application Nasal BID   sodium chloride flush  10 mL Intrapleural Q8H   sodium chloride flush  3 mL Intravenous Q12H   zinc sulfate (50mg  elemental zinc)  220 mg Oral Daily    Dialysis Orders: [facility] TTS. 3 hours 15 minutes. EDW: 77.5 kg (however likely well  under this as per outpatient unit). LUE AVF, 15-gauge needles. Flow rates: 400/800. 37 degrees. 2K/2.5 calcium. UF profile #2. Heparin: Loading 1000 units, 400 units/h. Meds: Mircera 100 mcg every 2 weeks (last dose 3/4)   Assessment/Plan: 1. R pleural effusion: S/p thoracentesis with IR on 3/13 2. Dehiscence R AKA - Plan for revision tomorrow by [name]. Ordered 1 unit PRBCs with HD today to prepare for procedure (see above).  3. ESRD: Typically on TTS schedule, short HD 3/14 and refused his regular HD 3/15. K+ 6 but on HD. Checking labs in AM. 4. HTN/volume: Noted Bps dropping on HD today. He remains euvolemic on exam. Running even for duration of treatments and IVFs given.  5. Anemia: S/p 1 unit PRBC on 3/13. Hgb 7.9. Last mircera dose on 04/26/23. Ordered 1 unit PRBCs with HD today per Ortho's recommendations. See above. 6. Secondary hyperparathyroidism: Corr Ca 10.08 and phos very high. Hold Calcitriol for now. Continue calcium acetate and sensipar 7. Nutrition:  Alb low, continue protein supplements   Salome Holmes, NP [facility] 05/10/2023,10:45 AM  LOS: 5 days

## 2023-05-10 NOTE — Progress Notes (Signed)
 PT Cancellation Note  Patient Details Name: [name]. MRN: [id number] DOB: 1971-02-21   Cancelled Treatment:    Reason Eval/Treat Not Completed: Patient at procedure or test/unavailable; will attempt later as schedule permits and patient available.   Elray Mcgregor 05/10/2023, 9:39 AM Sheran Lawless, PT Acute Rehabilitation Services [phone] 05/10/2023

## 2023-05-10 NOTE — Progress Notes (Signed)
 PT Cancellation Note  Patient Details Name: [name]. MRN: [id number] DOB: [birth year]   Cancelled Treatment:    Reason Eval/Treat Not Completed: Fatigue/lethargy limiting ability to participate;Pain limiting ability to participate; patient post HD and c/o pain from flexiseal.  Reports also for surgery tomorrow.  Declining PT today.  Will continue attempts.    Elray Mcgregor 05/10/2023, 2:43 PM Sheran Lawless, PT Acute Rehabilitation Services Office:[phone] 05/10/2023

## 2023-05-11 ENCOUNTER — Encounter (HOSPITAL_COMMUNITY): Admission: EM | Disposition: A | Discharge status: Home Health | Payer: Self-pay | Attending: Internal Medicine

## 2023-05-11 ENCOUNTER — Other Ambulatory Visit: Payer: Self-pay

## 2023-05-11 ENCOUNTER — Inpatient Hospital Stay (HOSPITAL_COMMUNITY): Admitting: Anesthesiology

## 2023-05-11 ENCOUNTER — Inpatient Hospital Stay (HOSPITAL_COMMUNITY)

## 2023-05-11 ENCOUNTER — Encounter (HOSPITAL_COMMUNITY): Payer: Self-pay | Admitting: Family Medicine

## 2023-05-11 ENCOUNTER — Encounter: Admitting: Family

## 2023-05-11 DIAGNOSIS — T8781 Dehiscence of amputation stump: Secondary | ICD-10-CM

## 2023-05-11 DIAGNOSIS — Z89611 Acquired absence of right leg above knee: Secondary | ICD-10-CM | POA: Diagnosis not present

## 2023-05-11 DIAGNOSIS — E119 Type 2 diabetes mellitus without complications: Secondary | ICD-10-CM

## 2023-05-11 DIAGNOSIS — I10 Essential (primary) hypertension: Secondary | ICD-10-CM

## 2023-05-11 DIAGNOSIS — J90 Pleural effusion, not elsewhere classified: Secondary | ICD-10-CM | POA: Diagnosis not present

## 2023-05-11 DIAGNOSIS — I2510 Atherosclerotic heart disease of native coronary artery without angina pectoris: Secondary | ICD-10-CM | POA: Diagnosis not present

## 2023-05-11 LAB — TYPE AND SCREEN
ABO/RH(D): A POS
Antibody Screen: NEGATIVE
Unit division: 0

## 2023-05-11 LAB — GLUCOSE, CAPILLARY
Glucose-Capillary: 77 mg/dL (ref 70–99)
Glucose-Capillary: 73 mg/dL (ref 70–99)
Glucose-Capillary: 59 mg/dL — ABNORMAL LOW (ref 70–99)
Glucose-Capillary: 80 mg/dL (ref 70–99)
Glucose-Capillary: 79 mg/dL (ref 70–99)
Glucose-Capillary: 127 mg/dL — ABNORMAL HIGH (ref 70–99)
Glucose-Capillary: 131 mg/dL — ABNORMAL HIGH (ref 70–99)

## 2023-05-11 LAB — RENAL FUNCTION PANEL
Albumin: 1.9 g/dL — ABNORMAL LOW (ref 3.5–5.0)
Anion gap: 15 (ref 5–15)
BUN: 60 mg/dL — ABNORMAL HIGH (ref 6–20)
CO2: 23 mmol/L (ref 22–32)
Calcium: 8.4 mg/dL — ABNORMAL LOW (ref 8.9–10.3)
Chloride: 102 mmol/L (ref 98–111)
Creatinine, Ser: 9.93 mg/dL — ABNORMAL HIGH (ref 0.61–1.24)
GFR, Estimated: 6 mL/min — ABNORMAL LOW (ref >60)
Glucose, Bld: 77 mg/dL (ref 70–99)
Phosphorus: 7.5 mg/dL — ABNORMAL HIGH (ref 2.5–4.6)
Potassium: 5.6 mmol/L — ABNORMAL HIGH (ref 3.5–5.1)
Sodium: 140 mmol/L (ref 135–145)

## 2023-05-11 LAB — CBC WITH DIFFERENTIAL/PLATELET
Abs Immature Granulocytes: 0.04 10*3/uL (ref 0.00–0.07)
Basophils Absolute: 0.1 10*3/uL (ref 0.0–0.1)
Basophils Relative: 1 %
Eosinophils Absolute: 0.9 10*3/uL — ABNORMAL HIGH (ref 0.0–0.5)
Eosinophils Relative: 10 %
HCT: 27.8 % — ABNORMAL LOW (ref 39.0–52.0)
Hemoglobin: 8.5 g/dL — ABNORMAL LOW (ref 13.0–17.0)
Immature Granulocytes: 0 %
Lymphocytes Relative: 15 %
Lymphs Abs: 1.4 10*3/uL (ref 0.7–4.0)
MCH: 22.4 pg — ABNORMAL LOW (ref 26.0–34.0)
MCHC: 30.6 g/dL (ref 30.0–36.0)
MCV: 73.2 fL — ABNORMAL LOW (ref 80.0–100.0)
Monocytes Absolute: 1.4 10*3/uL — ABNORMAL HIGH (ref 0.1–1.0)
Monocytes Relative: 14 %
Neutro Abs: 5.8 10*3/uL (ref 1.7–7.7)
Neutrophils Relative %: 60 %
Platelets: 240 10*3/uL (ref 150–400)
RBC: 3.8 MIL/uL — ABNORMAL LOW (ref 4.22–5.81)
RDW: 21.5 % — ABNORMAL HIGH (ref 11.5–15.5)
WBC: 9.6 10*3/uL (ref 4.0–10.5)
nRBC: 0 % (ref 0.0–0.2)

## 2023-05-11 LAB — BPAM RBC
Blood Product Expiration Date: 202504092359
ISSUE DATE / TIME: 202503181519
Unit Type and Rh: 6200

## 2023-05-11 LAB — PREALBUMIN: Prealbumin: 15 mg/dL — ABNORMAL LOW (ref 18–38)

## 2023-05-11 SURGERY — AMPUTATION, ABOVE KNEE
Anesthesia: General | Site: Knee | Laterality: Right

## 2023-05-11 MED ORDER — EPHEDRINE SULFATE (PRESSORS) 50 MG/ML IJ SOLN
INTRAMUSCULAR | Status: DC | PRN
Start: 2023-05-11 — End: 2023-05-11
  Administered 2023-05-11: 10 mg via INTRAVENOUS
  Administered 2023-05-11: 5 mg via INTRAVENOUS

## 2023-05-11 MED ORDER — FENTANYL CITRATE (PF) 100 MCG/2ML IJ SOLN: 25.0000 ug | INTRAMUSCULAR | Status: DC | PRN

## 2023-05-11 MED ORDER — DEXTROSE 50 % IV SOLN: 12.5000 g | INTRAVENOUS | Status: AC

## 2023-05-11 MED ORDER — OXYCODONE HCL 5 MG/5ML PO SOLN: 5.0000 mg | Freq: Once | ORAL | Status: DC | PRN

## 2023-05-11 MED ORDER — PROPOFOL 10 MG/ML IV BOLUS
INTRAVENOUS | Status: DC | PRN
  Administered 2023-05-11 (×2): 100 mg via INTRAVENOUS

## 2023-05-11 MED ORDER — CHLORHEXIDINE GLUCONATE 0.12 % MT SOLN
15.0000 mL | Freq: Once | OROMUCOSAL | Status: AC
  Administered 2023-05-11: 15 mL via OROMUCOSAL
  Filled 2023-05-11: qty 15

## 2023-05-11 MED ORDER — VANCOMYCIN HCL 1000 MG IV SOLR
INTRAVENOUS | Status: DC | PRN
  Administered 2023-05-11: 1000 mg

## 2023-05-11 MED ORDER — ONDANSETRON HCL 4 MG/2ML IJ SOLN
INTRAMUSCULAR | Status: DC | PRN
  Administered 2023-05-11: 4 mg via INTRAVENOUS

## 2023-05-11 MED ORDER — SODIUM ZIRCONIUM CYCLOSILICATE 10 G PO PACK
10.0000 g | PACK | ORAL | Status: AC
  Administered 2023-05-11: 10 g via ORAL
  Filled 2023-05-11: qty 1

## 2023-05-11 MED ORDER — DARBEPOETIN ALFA 100 MCG/0.5ML IJ SOSY
100.0000 ug | PREFILLED_SYRINGE | INTRAMUSCULAR | Status: DC
  Administered 2023-05-12: 100 ug via SUBCUTANEOUS
  Filled 2023-05-11: qty 0.5

## 2023-05-11 MED ORDER — FENTANYL CITRATE (PF) 250 MCG/5ML IJ SOLN
INTRAMUSCULAR | Status: AC
  Filled 2023-05-11: qty 5

## 2023-05-11 MED ORDER — OXYCODONE HCL 5 MG PO TABS: 5.0000 mg | ORAL_TABLET | Freq: Once | ORAL | Status: DC | PRN

## 2023-05-11 MED ORDER — SODIUM CHLORIDE 0.9 % IV SOLN: INTRAVENOUS | Status: DC

## 2023-05-11 MED ORDER — ONDANSETRON HCL 4 MG/2ML IJ SOLN: 4.0000 mg | Freq: Once | INTRAMUSCULAR | Status: DC | PRN

## 2023-05-11 MED ORDER — ONDANSETRON HCL 4 MG/2ML IJ SOLN
INTRAMUSCULAR | Status: AC
  Filled 2023-05-11: qty 2

## 2023-05-11 MED ORDER — ACETAMINOPHEN 10 MG/ML IV SOLN
INTRAVENOUS | Status: AC
  Filled 2023-05-11: qty 100

## 2023-05-11 MED ORDER — PHENYLEPHRINE 80 MCG/ML (10ML) SYRINGE FOR IV PUSH (FOR BLOOD PRESSURE SUPPORT)
PREFILLED_SYRINGE | INTRAVENOUS | Status: AC
  Filled 2023-05-11: qty 10

## 2023-05-11 MED ORDER — CHLORHEXIDINE GLUCONATE CLOTH 2 % EX PADS
6.0000 | MEDICATED_PAD | Freq: Every day | CUTANEOUS | Status: DC
  Administered 2023-05-12 – 2023-05-13 (×2): 6 via TOPICAL

## 2023-05-11 MED ORDER — ACETAMINOPHEN 160 MG/5ML PO SOLN: 325.0000 mg | ORAL | Status: DC | PRN

## 2023-05-11 MED ORDER — VANCOMYCIN HCL 1000 MG IV SOLR
INTRAVENOUS | Status: AC
  Filled 2023-05-11: qty 20

## 2023-05-11 MED ORDER — LIDOCAINE 2% (20 MG/ML) 5 ML SYRINGE
INTRAMUSCULAR | Status: AC
  Filled 2023-05-11: qty 5

## 2023-05-11 MED ORDER — DEXAMETHASONE SODIUM PHOSPHATE 10 MG/ML IJ SOLN
INTRAMUSCULAR | Status: AC
  Filled 2023-05-11: qty 1

## 2023-05-11 MED ORDER — PROPOFOL 1000 MG/100ML IV EMUL
INTRAVENOUS | Status: AC
  Filled 2023-05-11: qty 100

## 2023-05-11 MED ORDER — MEPERIDINE HCL 25 MG/ML IJ SOLN: 6.2500 mg | INTRAMUSCULAR | Status: DC | PRN

## 2023-05-11 MED ORDER — ORAL CARE MOUTH RINSE: 15.0000 mL | Freq: Once | OROMUCOSAL | Status: AC

## 2023-05-11 MED ORDER — VITAMIN C 500 MG PO TABS: 1000.0000 mg | ORAL_TABLET | Freq: Every day | ORAL | Status: DC

## 2023-05-11 MED ORDER — 0.9 % SODIUM CHLORIDE (POUR BTL) OPTIME
TOPICAL | Status: DC | PRN
  Administered 2023-05-11: 1000 mL

## 2023-05-11 MED ORDER — FENTANYL CITRATE (PF) 250 MCG/5ML IJ SOLN
INTRAMUSCULAR | Status: DC | PRN
  Administered 2023-05-11: 50 ug via INTRAVENOUS
  Administered 2023-05-11: 100 ug via INTRAVENOUS

## 2023-05-11 MED ORDER — ACETAMINOPHEN 325 MG PO TABS: 325.0000 mg | ORAL_TABLET | ORAL | Status: DC | PRN

## 2023-05-11 MED ORDER — LIDOCAINE 2% (20 MG/ML) 5 ML SYRINGE
INTRAMUSCULAR | Status: DC | PRN
  Administered 2023-05-11: 100 mg via INTRAVENOUS

## 2023-05-11 MED ORDER — ZINC SULFATE 220 (50 ZN) MG PO CAPS
220.0000 mg | ORAL_CAPSULE | Freq: Every day | ORAL | Status: DC
  Administered 2023-05-12 – 2023-05-17 (×6): 220 mg via ORAL
  Filled 2023-05-11 (×6): qty 1

## 2023-05-11 MED ORDER — DEXTROSE 50 % IV SOLN
INTRAVENOUS | Status: AC
  Administered 2023-05-11: 12.5 g via INTRAVENOUS
  Filled 2023-05-11: qty 50

## 2023-05-11 MED ORDER — ROCURONIUM BROMIDE 10 MG/ML (PF) SYRINGE
PREFILLED_SYRINGE | INTRAVENOUS | Status: AC
Start: 2023-05-11 — End: ?
  Filled 2023-05-11: qty 10

## 2023-05-11 MED ORDER — SUGAMMADEX SODIUM 200 MG/2ML IV SOLN
INTRAVENOUS | Status: AC
  Filled 2023-05-11: qty 2

## 2023-05-11 MED ORDER — PROPOFOL 10 MG/ML IV BOLUS
INTRAVENOUS | Status: AC
  Filled 2023-05-11: qty 20

## 2023-05-11 MED ORDER — VASHE WOUND IRRIGATION OPTIME
TOPICAL | Status: DC | PRN
  Administered 2023-05-11: 20 [oz_av]

## 2023-05-11 MED ORDER — JUVEN PO PACK
1.0000 | PACK | Freq: Two times a day (BID) | ORAL | Status: DC
  Administered 2023-05-11 – 2023-05-17 (×11): 1 via ORAL
  Filled 2023-05-11 (×11): qty 1

## 2023-05-11 SURGICAL SUPPLY — 42 items
BAG COUNTER SPONGE SURGICOUNT (BAG) IMPLANT
BLADE SAW RECIP 87.9 MT (BLADE) ×1 IMPLANT
BLADE SURG 21 STRL SS (BLADE) ×1 IMPLANT
BNDG COHESIVE 6X5 TAN NS LF (GAUZE/BANDAGES/DRESSINGS) IMPLANT
BNDG COHESIVE 6X5 TAN ST LF (GAUZE/BANDAGES/DRESSINGS) ×1 IMPLANT
BNDG GAUZE DERMACEA FLUFF 4 (GAUZE/BANDAGES/DRESSINGS) IMPLANT
CANISTER WOUND CARE 500ML ATS (WOUND CARE) IMPLANT
COVER SURGICAL LIGHT HANDLE (MISCELLANEOUS) ×1 IMPLANT
CUFF TRNQT CYL 34X4.125X (TOURNIQUET CUFF) IMPLANT
DRAPE INCISE IOBAN 66X45 STRL (DRAPES) ×2 IMPLANT
DRAPE U-SHAPE 47X51 STRL (DRAPES) ×1 IMPLANT
DRESSING PREVENA PLUS CUSTOM (GAUZE/BANDAGES/DRESSINGS) ×1 IMPLANT
DRSG ADAPTIC 3X8 NADH LF (GAUZE/BANDAGES/DRESSINGS) IMPLANT
DRSG PREVENA PLUS CUSTOM (GAUZE/BANDAGES/DRESSINGS) IMPLANT
DURAPREP 26ML APPLICATOR (WOUND CARE) ×1 IMPLANT
ELECT REM PT RETURN 9FT ADLT (ELECTROSURGICAL) ×1 IMPLANT
ELECTRODE REM PT RTRN 9FT ADLT (ELECTROSURGICAL) ×1 IMPLANT
GAUZE PAD ABD 8X10 STRL (GAUZE/BANDAGES/DRESSINGS) IMPLANT
GAUZE SPONGE 4X4 12PLY STRL (GAUZE/BANDAGES/DRESSINGS) IMPLANT
GLOVE BIOGEL PI IND STRL 7.0 (GLOVE) IMPLANT
GLOVE BIOGEL PI IND STRL 7.5 (GLOVE) ×1 IMPLANT
GLOVE BIOGEL PI IND STRL 9 (GLOVE) ×1 IMPLANT
GLOVE SURG ORTHO 9.0 STRL STRW (GLOVE) ×1 IMPLANT
GLOVE SURG SS PI 6.5 STRL IVOR (GLOVE) ×1 IMPLANT
GOWN STRL REUS W/ TWL LRG LVL3 (GOWN DISPOSABLE) ×1 IMPLANT
GOWN STRL REUS W/ TWL XL LVL3 (GOWN DISPOSABLE) ×2 IMPLANT
GRAFT SKIN WND MICRO 38 (Tissue) IMPLANT
KIT BASIN OR (CUSTOM PROCEDURE TRAY) ×1 IMPLANT
KIT TURNOVER KIT B (KITS) ×1 IMPLANT
MANIFOLD NEPTUNE II (INSTRUMENTS) ×1 IMPLANT
NS IRRIG 1000ML POUR BTL (IV SOLUTION) ×1 IMPLANT
PACK ORTHO EXTREMITY (CUSTOM PROCEDURE TRAY) ×1 IMPLANT
PAD ARMBOARD POSITIONER FOAM (MISCELLANEOUS) ×1 IMPLANT
PREVENA RESTOR ARTHOFORM 46X30 (CANNISTER) ×1 IMPLANT
STAPLER VISISTAT 35W (STAPLE) IMPLANT
STOCKINETTE IMPERVIOUS LG (DRAPES) IMPLANT
SUT ETHILON 2 0 PSLX (SUTURE) ×2 IMPLANT
SUT SILK 2-0 18XBRD TIE 12 (SUTURE) ×1 IMPLANT
SUT VIC AB 1 CTX36XBRD ANBCTRL (SUTURE) IMPLANT
TOWEL GREEN STERILE FF (TOWEL DISPOSABLE) ×1 IMPLANT
TUBE CONNECTING 20X1/4 (TUBING) ×1 IMPLANT
YANKAUER SUCT BULB TIP NO VENT (SUCTIONS) ×1 IMPLANT

## 2023-05-11 NOTE — Anesthesia Postprocedure Evaluation (Signed)
 Anesthesia Post Note  Patient: [name].  Procedure(s) Performed: RIGHT ABOVE KNEE AMPUTATION (Right: Knee)     Patient location during evaluation: PACU Anesthesia Type: General Level of consciousness: awake and alert Pain management: pain level controlled Vital Signs Assessment: post-procedure vital signs reviewed and stable Respiratory status: spontaneous breathing, nonlabored ventilation, respiratory function stable and patient connected to nasal cannula oxygen Cardiovascular status: blood pressure returned to baseline and stable Postop Assessment: no apparent nausea or vomiting Anesthetic complications: no   No notable events documented.  Last Vitals:  Vitals:   05/11/23 1445 05/11/23 1500  BP: 117/65 138/72  Pulse: 60 60  Resp: 11 19  Temp:  36.8 C  SpO2: 92% 92%    Last Pain:  Vitals:   05/11/23 1557  TempSrc:   PainSc: 9                  Kerrilynn Derenzo

## 2023-05-11 NOTE — TOC Progression Note (Signed)
 Transition of Care (TOC) - Progression Note    Patient Details  Name: [name]. MRN: [id number] Date of Birth: 1971-01-08  Transition of Care [facility]) CM/SW Contact  Marliss Coots, LCSW Phone Number: 05/11/2023, 3:59 PM  Clinical Narrative:     3:59 PM Per chart review, patient is to discharge to [facility] but has SDOH needs (housing). CSW added resources to patient's AVS.   Expected Discharge Plan: Skilled Nursing Facility Barriers to Discharge: Continued Medical Work up  Expected Discharge Plan and Services In-house Referral: Clinical Social Work   Post Acute Care Choice: Skilled Nursing Facility Living arrangements for the past 2 months: Skilled Nursing Facility, Single Family Home                                       Social Determinants of Health (SDOH) Interventions SDOH Screenings   Food Insecurity: No Food Insecurity (05/05/2023)  Housing: High Risk (05/05/2023)  Transportation Needs: No Transportation Needs (05/05/2023)  Utilities: Not At Risk (05/05/2023)  Financial Resource Strain: High Risk (07/05/2017)  Physical Activity: Inactive (07/05/2017)  Social Connections: Socially Integrated (05/05/2023)  Stress: Stress Concern Present (07/05/2017)  Tobacco Use: Low Risk  (05/11/2023)  Health Literacy: Low Risk  (06/01/2020)   Received from [facility], [facility]    Readmission Risk Interventions    07/27/2021   11:49 AM 11/27/2020    3:06 PM  Readmission Risk Prevention Plan  Transportation Screening Complete Complete  PCP or Specialist Appt within 3-5 Days  Complete  HRI or Home Care Consult  Complete  Social Work Consult for Recovery Care Planning/Counseling  Complete  Palliative Care Screening  Not Applicable  Medication Review Oceanographer) Complete Complete  SW Recovery Care/Counseling Consult Complete   Palliative Care Screening Not Applicable   Skilled Nursing Facility Not Applicable

## 2023-05-11 NOTE — Progress Notes (Signed)
 [facility] Progress Note   Subjective:    Seen and examined patient at bedside. He reports feeling better this morning. Patient c/o blurred vision with low Bps causing UF to be held during yesterday's HD. IVFs given and another 1 unit PRBCs also given yesterday. Hgb now 8.5. Current K+ is 5.6 and Lokelma 10GM X 1 ordered. Plan for R AKA revision by [name] today.  Objective Vitals:   05/10/23 2034 05/11/23 0219 05/11/23 0552 05/11/23 0909  BP: (!) 193/82 (!) 176/84 123/80 (!) 174/81  Pulse:  66 64 62  Resp:   18 18  Temp:   98 F (36.7 C)   TempSrc:   Oral   SpO2:   95% 94%  Weight:      Height:       Physical Exam General: Alert male in NAD Heart: RRR, no murmur Lungs: Respirations unlabored on RA, CTA anteriorly Abdomen: Non-distended Extremities: No peripheral edema Dialysis Access: AVF (+) B/T  Filed Weights   05/06/23 1130 05/10/23 0833 05/10/23 1203  Weight: 78 kg 67.5 kg 69 kg    Intake/Output Summary (Last 24 hours) at 05/11/2023 1026 Last data filed at 05/11/2023 0600 Gross per 24 hour  Intake 1326.17 ml  Output 100 ml  Net 1226.17 ml    Additional Objective Labs: Basic Metabolic Panel: Recent Labs  Lab 05/09/23 0755 05/10/23 0839 05/11/23 0820  NA 138 138 140  K 5.9* 6.0* 5.6*  CL 104 105 102  CO2 21* 19* 23  GLUCOSE 83 70 77  BUN 73* 83* 60*  CREATININE 11.74* 13.39* 9.93*  CALCIUM 8.5* 8.4* 8.4*  PHOS 9.5* 10.3* 7.5*   Liver Function Tests: Recent Labs  Lab 05/05/23 0804 05/06/23 0904 05/09/23 0755 05/10/23 0839 05/11/23 0820  AST 13*  --   --   --   --   ALT 10  --   --   --   --   ALKPHOS 58  --   --   --   --   BILITOT 0.6  --   --   --   --   PROT 6.5  --   --   --   --   ALBUMIN 2.3*   < > 1.9* 1.9* 1.9*   < > = values in this interval not displayed.   No results for input(s): "LIPASE", "AMYLASE" in the last 168 hours. CBC: Recent Labs  Lab 05/05/23 0804 05/05/23 1124 05/06/23 0904 05/06/23 2011  05/07/23 0332 05/09/23 0755 05/10/23 0839 05/11/23 0820  WBC 8.1   < > 7.3  --  7.5 9.5 8.3 9.6  NEUTROABS 5.3  --   --   --   --   --   --  5.8  HGB 7.1*   < > 6.8*   < > 7.8* 7.9* 7.2* 8.5*  HCT 25.0*   < > 23.0*   < > 25.4* 26.4* 24.5* 27.8*  MCV 71.8*   < > 71.9*  --  72.0* 71.2* 72.1* 73.2*  PLT 249   < > 237  --  235 243 248 240   < > = values in this interval not displayed.   Blood Culture    Component Value Date/Time   SDES FLUID PLEURAL RIGHT 05/05/2023 1443   SDES FLUID PLEURAL RIGHT 05/05/2023 1443   SPECREQUEST BOTTLES DRAWN AEROBIC AND ANAEROBIC 05/05/2023 1443   SPECREQUEST NONE 05/05/2023 1443   CULT  05/05/2023 1443    NO GROWTH 5 DAYS Performed  at [facility], [address] [place], [place] [address]    REPTSTATUS 05/10/2023 FINAL 05/05/2023 1443   REPTSTATUS 05/05/2023 FINAL 05/05/2023 1443    Cardiac Enzymes: No results for input(s): "CKTOTAL", "CKMB", "CKMBINDEX", "TROPONINI" in the last 168 hours. CBG: Recent Labs  Lab 05/06/23 0803 05/09/23 0838 05/09/23 2051 05/10/23 0711 05/11/23 0906  GLUCAP 74 77 101* 76 77   Iron Studies: No results for input(s): "IRON", "TIBC", "TRANSFERRIN", "FERRITIN" in the last 72 hours. Lab Results  Component Value Date   INR 1.2 05/06/2023   INR 1.2 05/05/2023   INR 1.1 03/01/2022   Studies/Results: DG Chest Port 1 View Result Date: 05/11/2023 CLINICAL DATA:  Pleural effusion. EXAM: PORTABLE CHEST 1 VIEW COMPARISON:  Chest radiograph dated 05/09/2023. FINDINGS: Interval removal of the right chest tube. Small right pleural effusion with right lung base atelectasis. Small fluid noted in the right fissure. No pneumothorax. Stable mild cardiomegaly. Coronary vascular calcification. No acute osseous pathology. IMPRESSION: 1. Interval removal of the right chest tube. No pneumothorax. 2. Small right pleural effusion with right lung base atelectasis. Electronically Signed   By: Elgie Collard M.D.   On: 05/11/2023  09:51   DG Chest Port 1 View Result Date: 05/09/2023 CLINICAL DATA:  [id number].  Pleural effusion. EXAM: PORTABLE CHEST 1 VIEW COMPARISON:  Portable chest yesterday at 6:19 a.m. FINDINGS: 4:09 p.m. Pigtail pleural tube in the lower lateral right chest remains stable in positioning with adjacent right lateral basal opacity and overlying linear atelectasis. Streak atelectasis or infiltrate in the left lower lobe retrocardiac space is also unchanged. The remainder of the lungs are clear. There is stable cardiomegaly without evidence of CHF. Coronary artery stent again noted on left stable mediastinal of aeration. No new osseous abnormality. IMPRESSION: Stable overall aeration and chest tube positioning. No measurable pneumothorax. Electronically Signed   By: Almira Bar M.D.   On: 05/09/2023 20:06    Medications:   ceFAZolin (ANCEF) IV     tranexamic acid      amiodarone  100 mg Oral BH-q7a   ascorbic acid  1,000 mg Oral Daily   aspirin EC  81 mg Oral Daily   atorvastatin  80 mg Oral QHS   calcium acetate  1,334 mg Oral TID WC   carvedilol  12.5 mg Oral BID   chlorhexidine  60 mL Topical Once   Chlorhexidine Gluconate Cloth  6 each Topical Q0600   cinacalcet  60 mg Oral Q T,Th,Sat-1800   dorzolamide-timolol  1 drop Right Eye BID   Gerhardt's butt cream   Topical BID   heparin  5,000 Units Subcutaneous Q8H   hydrocortisone  1 Application Rectal BID   insulin aspart  0-6 Units Subcutaneous TID WC   levETIRAcetam  500 mg Oral Once per day on Sunday Monday Wednesday Friday   levETIRAcetam  750 mg Oral Once per day on Tuesday Thursday Saturday   lidocaine-prilocaine  1 Application Topical Once per day on Monday Wednesday Friday   multivitamin  1 tablet Oral QHS   povidone-iodine  2 Application Topical Once   sodium chloride flush  10 mL Intrapleural Q8H   sodium chloride flush  3 mL Intravenous Q12H   sodium zirconium cyclosilicate  10 g Oral STAT   tranexamic acid (CYKLOKAPRON) 2,000 mg in  sodium chloride 0.9 % 50 mL Topical Application  2,000 mg Topical To OR   zinc sulfate (50mg  elemental zinc)  220 mg Oral Daily    Dialysis Orders: [facility]. 3 hours 15 minutes. EDW: 77.5 kg (however likely well under this as per outpatient unit). LUE AVF, 15-gauge needles. Flow rates: 400/800. 37 degrees. 2K/2.5 calcium. UF profile #2. Heparin: Loading 1000 units, 400 units/h. Meds: Mircera 100 mcg every 2 weeks (last dose 3/4)   Assessment/Plan: 1. R pleural effusion: S/p thoracentesis with IR on 3/13 2. Dehiscence R AKA - Plan for revision today by [name].  3. ESRD: HD TTS. Next HD 3/20. UF as tolerated 4. HTN/volume: Bps variable/elevated after getting IVFs and blood yesterday. Remains euvolemic on exam.  5. Anemia: S/p 2 units PRBCs total since admit (3/13 and 3/18). Hgb now 8.5. Last mircera dose on 04/26/23, will give next dose with HD tomorrow. 6. Secondary hyperparathyroidism: Corr Ca 10.08 and phos is trending. Hold Calcitriol for now. Will switch binder to Velphoro. Continue sensipar 7. Nutrition:  Alb low, continue protein supplements   Salome Holmes, NP [facility] 05/11/2023,10:26 AM  LOS: 6 days

## 2023-05-11 NOTE — Discharge Instructions (Signed)
 Homeless Shelter List:  Health and safety inspector [facility]) [address] [place], [place]  Phone: [phone]  [facility] [address] [place], [place] [address] Phone: [phone]  [facility] (Women only) [address]Cyril Loosen [place], [place] [address] Phone: [phone]  [facility] [address], [place] [address] Phone: [phone]  [facility] of Hope: [phone]. [address] [place], [place] [address] Phone: [phone]  [name]  [address] [place], [place] [address] (Check in at 6:00PM for placement at a local shelter) Phone: [phone]  Rent/Utility Assistance in [facility]:  [facility] [address] [place], [place] [address] [phone] Mon 8:00am - 6:00pm; Tue 8:00am - 6:00pm; Wed 8:00am - 6:00pm; Thu 8:00am - 6:00pm; Fri 8:00am - 6:00pm; Email: [email or web address] Eligibility: Residents of [place], [place], [place], [place], [place] and [place] that meet income limits. Call or text for eligibility screening.   [facility] MIN[facility] [address] [place], [place], [place] [address] [phone] (Main: Rental Assistance) [phone] (Main: Utility Assistance) Mon 8:30am - 5:00pm; Tue 8:30am - 5:00pm; Wed 8:30am - 5:00pm; Thu 8:30am - 5:00pm; Fri 8:30am - 5:00pm; Website: http://www.greensborourbanministry.org/emergency-assistance-program Eligibility: People who have an unexpected crisis or emergency that can be verified. Must have some form of income and meet income limits. At the first of the month, only helps with rent/mortgage assistance for those who have court ordered eviction notices. Call for application information. Call for exact documents that will be needed. Examples of documents that may be needed: Photo ID, Social Security cards for everyone in the household, and proof of  income for previous 2 months. Copy of eviction notice for rent assistance and copy of final notice for utility assistance. Statements or receipts of bills for previous 2 months.   [facility] [place] [address] [place], [place] [address] [phone] (Main) [phone] (Alternate) Mon 9:00am - 5:00pm; Tue 9:00am - 5:00pm; Wed 9:00am - 5:00pm; Thu 9:00am - 5:00pm; Fri 9:00am - 5:00pm; Website: [email or web address].[email or web address]/[place]/emergency-financial-assistance Email: [email or web address] Eligibility: People experiencing a housing crisis with past-due rent and/or utilities and meet income limits. Must be willing to take part in 6 Call or visit website to download application. Return complete application by mail or email only. Documents: Help with Utilities: Photo ID, proof of household income, copies of monthly bills or receipts, and a final disconnection/shut-off notice. Help with Rent or Mortgage: Photo ID, proof of income, copies of monthly bills or receipts, and eviction notice. Help with Household Goods: Photo ID, proof of household income, copies of monthly bills or receipts, and a fire or flood report.  [facility] [place] [address], [place], [place] [address] [phone] (Main) Mon 8:00am - 5:00pm; Tue 8:00am - 5:00pm; Wed 8:00am - 5:00pm; Thu 8:00am - 5:00pm; Fri 8:00am - 12:00pm; Website: http://[email or web address].salvationarmy.org/[place]/emergency-financial-assistance Email: [email or web address] Call for eligibility information. Apply :Utilities Assistance: Visit office by 8:30am on 1st and 4th Monday of each month to pick up application. Rent and Mortgage Assistance: Visit office by 8:30am on 2nd and 3rd Monday of each month to pick up application. NOTE: If Monday falls on a holiday applications can be picked up the following Tuesday. Documents required will be listed on application.  [facility] [phone] (Main) Seen by appointment only. Call for more information. Eligibility: Meet income limits. Apply: Call for information on how to schedule an appointment. Each month  there is a specific day to call to schedule an appointment. It is stated on the agency voicemail message. Appointments fill up quickly each month. Documents: Photo ID, copy of current utility bill.  [facility] [place] [address], [place], [place] [address] [phone] (Main) Tue 9:00am - 4:00pm; Wed 9:00am - 4:00pm; Thu 9:00am - 4:00pm; Website: [email or web address].[email or web address] Email: [email or web address] Eligibility: Utility Assistance: Meet income limits and be a Holiday representative. [employer] customers do not qualify. Must not have received utility assistance for another agency within the last 90 days. Rent Assistance: Residents of [place] who meet income limits. Must not have received rent assistance for another agency within the last 90 days. Apply: Call to schedule an appointment. Documents: Utility Assistance: Photo ID, City of [employer], copy of lease (if not paying a mortgage), proof of income, and monthly expenses. Rent Assistance: Photo ID, W-9 from the landlord, copy of the lease, proof of income, and a list of monthly expenses.  [facility] [place] [address] [place], [place] [address] [phone] (Main: Help With Rent) [phone] (Main: Help With Utilities) Mon 9:00am - 4:00pm; Tue 9:00am - 4:00pm; Wed 9:00am - 4:00pm; Thu 9:00am - 4:00pm; Fri 9:00am - 4:00pm; Website: [email or web address].no Email: [email or web address] Eligibility: People experiencing a financial crisis. Apply: Call to schedule an appointment Wednesday, 7:30am. Documents: Photo ID, Social Security card, proof of income, and proof of address. Other  documents may be required, depending on service. Call for more information.  LOW INCOME ENERGY ASSISTANCE PROGRAM DEPARTMENT OF SOCIAL SERVICES - [facility] [address], [place], [place] [address] [phone] (Main) Mon 8:00am - 5:00pm; Tue 8:00am - 5:00pm; Wed 8:00am - 5:00pm; Thu 8:00am - 5:00pm; Fri 8:00am - 5:00pm; Website: [email or web address] Eligibility: Meet income limits and resource guidelines. Each household is only eligible once, even if multiple members apply. Apply: Call to see if funds are available. Visit to complete an application, call to have 1 mailed, or apply online at [email or web address] NOTE: Households with a person age 25 and over or a person with a documented disability can apply beginning December 1. Other households can apply beginning January 1. Documents: Photo ID, birth certificate, proof of household income, copy of utility bill, latest bank statement, the names and Social Security numbers for everyone in the household, and proof of disability if under age 23.  LOW INCOME ENERGY ASSISTANCE PROGRAM DEPARTMENT OF SOCIAL SERVICES - [facility] [address] [place], [place], [place] [address] [phone] (Main) Mon 8:00am - 5:00pm; Tue 8:00am - 5:00pm; Wed 8:00am - 5:00pm; Thu 8:00am - 5:00pm; Fri 8:00am - 5:00pm; Website: [email or web address] Eligibility: Meet income limits and resource guidelines. Each household is only eligible once, even if multiple members apply. Apply: Call to see if funds are available. Visit to complete an application, call to have 1 mailed, or apply online at [email or web address] NOTE: Households with a person age 54 and over or a person with a documented disability can apply beginning December 1. Other households can apply beginning January 1. Documents: Photo ID, birth certificate, proof of household  income, copy of utility bill, latest bank statement, the names and Social Security numbers for everyone in the household, and proof of disability if under age 68.   [employer] assistance programs Crisis assistance programs  -[facility] Homelessness Arts development officer. If you are experiencing homelessness in [place], [place], your first point of contact should be Coordinated  Entry. You can reach Coordinated Entry by calling [phone] or by emailing [email or web address].  Community access points: [employer] ([phone] [address], [place]) every Tuesday from 9am-10am. [facility] ([address] [place], [place]) every Wednesday from 8am-9am.   -[place] Coordinated Re-entry Marcy Panning: Dial 211 and request. Offers referrals to homeless shelters in the area.    -The [employer] [phone]) offers several services to local families, as funding allows. The Emergency Assistance Program (EAP), which they administer, provides household goods, free food, clothing, and financial aid to people in need in the [facility] area. The EAP program does have some qualification, and counselors will interview clients for financial assistance by written referral only. Referrals need to be made by the Department of Social Services or by other EAP approved human services agencies or charities in the area.  -[facility] of [place], which can be reached at [phone], offers emergency assistance programs for those in need of help, such as food, rent assistance, a soup kitchen, shelter, and clothing. They are based in [facility] but provide a number of services to those that qualify for assistance.   [facility] Department of Social Services may be able to offer temporary financial assistance and cash grants for paying rent and utilities, Help may be provided for local county residents who may be  experiencing personal crisis when other resources, including government programs, are not available. Call [phone]  -High [employer] Army is a [employer] agency, The organization can offer emergency assistance for paying rent, Caremark Rx, utilities, food, household products and furniture. They offer extensive emergency and transitional housing for families, children and single women, and also run a Boy's and [employer]. Thrift Shops, Secondary school teacher, and other aid offered too. [address], [place], [place] [address], [phone]  -[place] Low Income Energy Assistance Program -- This is offered for [facility] families. The federal government created [employer] Program provides a one-time cash grant payment to help eligible low-income families pay their electric and heating bills. [address], [place], [place] [address], [phone]  -[place] Emergency Assistance -- A program offers emergency utility and rent funds for greater [place] area residents. The program can also provide counseling and referrals to charities and government programs. Also provides food and a free meal program that serves lunch Mondays - Saturdays and dinner seven days per week to individuals in the community. [address], [place], [place] [address], [phone]  -[employer] - Offers affordable apartment and housing communities across      [place] and [place]. The low income and seniors can access public housing, rental assistance to qualified applicants, and apply for the section 8 rent subsidy program. Other programs include Chiropractor and Engineer, maintenance. [address], [place], [place] [address], dial [phone].  -The [facility] provides transitional housing to veterans and the disabled. Clients will also access other services too,  including assistance in applying for Disability, life skills classes, case management, and assistance in finding permanent housing. [address], [place], [place] [address], call [phone]  -[place] Transitional Housing through [employer] is for people who were just evicted or that are formerly homeless. The non-profit will also help then gain self-sufficiency, find a home or apartment to live in, and also provides information on rent assistance when needed. Phone (  [phone]  -The [place] [employer] [facility] helps low income, elderly, or disabled residents in seven counties in the [place] ([place], [place], [place], [place], [place], [place], [place], and [place]) save energy and reduce their utility bills by improving energy efficiency. Phone [phone].  -[employer] is located in the [place] Housing Hub in the [employer], [address], [place], [place] [address]. Parking is in the rear of the building. Phone: [phone]   General Email: Glenetta Hew  GHC provides free housing counseling assistance in locating affordable rental housing or housing with support services for families and individuals in crisis and the chronically homeless. We provide potential resources for other housing needs like utilities. Our trained counselors also work with clients on budgeting and financial literacy in effort to empower them to take control of their financial situations. [employer] collaborates with homeless service providers and other stakeholders as part of the [employer] COC (Continuum of Care). The (COC) is a regional/local planning body that coordinates housing and services funding for homeless families and individuals. The role of [facility] in the COC is through housing counseling to work with people we serve on diversion strategies for those that  are at imminent risk of becoming homeless. We also work with the Coordinated Assessment/Entry Specialist who attempts to find temporary solutions and/or connects the people to Housing First, Rapid Re-housing or transitional housing programs. Our Homelessness Prevention Housing Counselors meet with clients on business days (Monday-Fridays, except scheduled holidays) from 8:30 am to 4:30 pm.  Legal assistance for evictions, foreclosure, and more -If you need free legal advice on civil issues, such as foreclosures, evictions, Electronics engineer, government programs, domestic issues and more, Armed forces operational officer Aid of [facility] [place]) is a Associate Professor firm that provides free legal services and counsel to lower income people, seniors, disabled, and others, The goal is to ensure everyone has access to justice and fair representation. Call them at [phone].  [facility] for Housing and Community Studies can provide info about obtaining legal assistance with evictions. Phone [phone].  Data processing manager  The [employer], [employer]. offers job and Dispensing optician. Resources are focused on helping students obtain the skills and experiences that are necessary to compete in today's challenging and tight job market. The non-profit faith-based community action agency offers internship trainings as well as classroom instruction. Classes are tailored to meet the needs of people in the [facility] region. [place], [place] [address], [phone]  Foreclosure prevention/Debt Services Family Services of the [employer] Credit Counseling Service inludes debt and foreclosure prevention programs for local families. This includes money management, financial advice, budget review and development of a written action plan with a Pensions consultant to help solve specific individual financial problems. In addition, housing and mortgage counselors can also  provide pre- and post-purchase homeownership counseling, default resolution counseling (to prevent foreclosure) and reverse mortgage counseling. A Debt Management Program allows people and families with a high level of credit card or medical debt to consolidate and repay consumer debt and loans to creditors and rebuild positive credit ratings and scores. Contact [phone].  Community clinics in [place] -Health Department [facility]: [address] [place], [place], [address]. [phone].  -Health Department [place] [facility]: [address], [facility], [address]. [phone].  -[facility] offers medical care through a group of doctors, pharmacies and other healthcare related agencies that offer services for low  income, uninsured adults in [place]. Also offers adult Dental care and assistance with applying for an [employer]. Call [phone].   [facility]. This center provides low-cost health care to those without health insurance. Services offered include an onsite pharmacy. Phone ([phone]. [address], [address], [place].  -Medication Assistance Program serves as a link between pharmaceutical companies and patients to provide low cost or free prescription medications. This service is available for residents who meet certain income restrictions and have no insurance coverage. PLEASE CALL [phone] Ginette Otto) OR [phone] ([place])  -One Step Further: Materials engineer, The [employer] Support & Nutrition Program, [employer]. Call [phone]/ [phone].  Food pantry and assistance -[facility]ban [facility]-Food Bank: [address], [place] [address]. Phone [phone]  -[facility]less[facility] Food Pantry: [address], [place], [place] [address]. [phone].  -Missionary Ministry: has the purpose of visiting the sick and shut-ins and provide for needs  in the surrounding communities. Call [phone]. Email: [email or web address] This program provides: Food box for seniors, Financial assistance, Food to meet basic nutritional needs.  -Meals on Wheels with Senior Resources: [facility] residents age 62 and over who are homebound and unable to obtain and prepare a nutritious meal for themselves are eligible for this service. There may be a waiting list in certain parts of [facility] if the route in that area is full. If you are in [facility] and [place] call [phone] to register. For all other areas call [phone] to register.  -Greater Dietitian: https://findfood.BargainContractor.si  TRANSPORTATION: -[employer] Department of Health: Call [facility]) and [employer] at [phone] for details. AttractionGuides.es  -Access [place]: Access [place] is the [employer] Agency's shared-ride transportation service for eligible riders who have a disability that prevents them from riding the fixed route bus. Call ([phone]) [phone]. Access [place] riders must pay a fare of $1.50 per trip, or may purchase a 10-ride punch card for $14.00 ($1.40 per ride) or a 40-ride punch card for $48.00 ($1.20 per ride).  -The Shepherd's WHEELS rideshare transportation service is provided for senior citizens (60+) who live independently within [place] city limits and are unable to drive or have limited access to transportation. Call [phone] to schedule an appointment.  -[place] Transportation: For Medicare or Medicaid recipients call [phone]?[facility], wheelchair Zenaida Niece, and ambulatory quotes available.   FLEEING VIOLENCE: -Family Services of the [place]- 24/7 Crisis line [phone]) -[facility] Justice Centers: (336) [phone]-[phone] [phone])  [place] 2-1-1 is another useful way to locate resources in the community. Visit  ShedSizes.ch to find service information online. If you need additional assistance, 2-1-1 Referral Specialists are available 24 hours a day, every day by dialing 2-1-1 or [phone] from any phone. The call is free, confidential, and available in any language.  Affordable Housing Search http://www.nchousingsearch.org  [facility])   M-F 8a-3p [address] [place], [place] [address] [phone] Services include: laundry, barbering, support groups, case management, phone & computer access, showers, AA/NA mtgs, mental health/substance abuse nurse, job skills class, disability information, VA assistance, spiritual classes, etc. Winter Shelter available when temperatures are less than 32 degrees.   HOMELESS SHELTERS [facility] at [facility]- Call [phone] ext. [phone] or ext. 336. Located at [address] [place], [place] [address]  [facility]- Call [phone]. Located at [address], [place] [address].  [facility]- [employer]. Call [phone]. Office located at [address], [place] [address].  [facility] Family Housing through [place] [phone].  [facility] Family Shelter- Call [phone]. Located at [address] [place], [place], [place] [address].  Room at the Inn-For Pregnant mothers. Call [phone]. Located at [address] [place], [address].  [place] [facility]-For men in [place]. Call [phone]. [facility]-Shelter in [place]. Call [phone].  Home of [employer] for [employer] [phone]. Office located at [address] [place], [address].  [employer] be agreeable to help with chores. Call [phone] ext. [phone].  Men's: [address] [place], [place] [address]. Women's: [facility]  [address] [place], [place] [address]  Crisis Services Therapeutic  Alternatives Mobile Crisis Management- [phone]  [facility] [address], [place], [place] [address]. Phone: [phone]

## 2023-05-11 NOTE — Transfer of Care (Signed)
 Immediate Anesthesia Transfer of Care Note  Patient: [name].  Procedure(s) Performed: RIGHT ABOVE KNEE AMPUTATION (Right: Knee)  Patient Location: PACU  Anesthesia Type:General  Level of Consciousness: awake and sedated  Airway & Oxygen Therapy: Patient Spontanous Breathing and Patient connected to face mask oxygen  Post-op Assessment: Report given to RN and Post -op Vital signs reviewed and stable  Post vital signs: Reviewed and stable  Last Vitals:  Vitals Value Taken Time  BP 125/63 05/11/23 1434  Temp 36.9 C 05/11/23 1434  Pulse 61 05/11/23 1439  Resp 12 05/11/23 1439  SpO2 100 % 05/11/23 1439  Vitals shown include unfiled device data.  Last Pain:  Vitals:   05/11/23 1434  TempSrc:   PainSc: 0-No pain      Patients Stated Pain Goal: 0 (05/08/23 1801)  Complications: No notable events documented.

## 2023-05-11 NOTE — Anesthesia Procedure Notes (Signed)
 Procedure Name: LMA Insertion Date/Time: 05/11/2023 1:57 PM  Performed by: Alwyn Ren, CRNAPre-anesthesia Checklist: Patient identified, Emergency Drugs available, Suction available, Patient being monitored and Timeout performed Patient Re-evaluated:Patient Re-evaluated prior to induction Oxygen Delivery Method: Circle system utilized Preoxygenation: Pre-oxygenation with 100% oxygen Induction Type: IV induction Ventilation: Mask ventilation without difficulty LMA: LMA inserted LMA Size: 4.0 Number of attempts: 1 Placement Confirmation: positive ETCO2

## 2023-05-11 NOTE — Op Note (Signed)
 05/11/2023  2:21 PM  PATIENT:  [name].    PRE-OPERATIVE DIAGNOSIS:  Dehiscence Right Above Knee Amputation  POST-OPERATIVE DIAGNOSIS:  Same  PROCEDURE: Revision AMPUTATION, ABOVE KNEE right. Application Kerecis micro graft and 1 g vancomycin powder.  SURGEON:  [name], MD  PHYSICIAN ASSISTANT:None ANESTHESIA:   General  PREOPERATIVE INDICATIONS:  [name]. is a  53 y.o. male with a diagnosis of Dehiscence Right Above Knee Amputation who failed conservative measures and elected for surgical management.    The risks benefits and alternatives were discussed with the patient preoperatively including but not limited to the risks of infection, bleeding, nerve injury, cardiopulmonary complications, the need for revision surgery, among others, and the patient was willing to proceed.  OPERATIVE IMPLANTS:   Implant Name Type Inv. Item Serial No. Manufacturer Lot No. LRB No. Used Action  GRAFT SKIN WND MICRO 38 - [id number] Tissue GRAFT SKIN WND MICRO 38  [employer]  Right 1 Implanted    @ENCIMAGES @  OPERATIVE FINDINGS: Minimal petechial bleeding.  Tissue margins were clear.  OPERATIVE PROCEDURE: Patient was brought the operating room and underwent a general anesthetic.  After adequate levels anesthesia obtained patient's right lower extremity was prepped using DuraPrep draped into a sterile field a timeout was called.  Elliptical incision was made around the area of the wound dehiscence.  This was carried sharply down to bone.  Reciprocating saw was used to perform a revision to the above-knee amputation.  Electrocautery was used hemostasis.  The wound was irrigated with Vashe.  The wound bed was filled with 38 cm of Kerecis micro graft and 1 g vancomycin powder to cover a wound surface area that was 5 x 10 cm.  The deep fascia was closed using #1 Vicryl the skin was closed using 2-0 nylon sterile dressing was applied patient was extubated taken the PACU in stable  condition   DISCHARGE PLANNING:  Antibiotic duration: Continue antibiotics for 24 hours  Weightbearing: Nonweightbearing on the right  Pain medication: Continue current pain medication  Dressing care/ Wound VAC: Dry dressing change as needed  Ambulatory devices: Walker  Discharge to: Plan for discharge to skilled nursing.  Follow-up: In the office 1 week post operative.

## 2023-05-11 NOTE — Anesthesia Preprocedure Evaluation (Addendum)
 Anesthesia Evaluation  Patient identified by MRN, date of birth, ID band Patient awake    Reviewed: Allergy & Precautions, H&P , NPO status , Patient's Chart, lab work & pertinent test results  Airway Mallampati: III  TM Distance: >3 FB Neck ROM: Full    Dental no notable dental hx. (+) Teeth Intact, Dental Advisory Given   Pulmonary neg pulmonary ROS   Pulmonary exam normal breath sounds clear to auscultation       Cardiovascular Exercise Tolerance: Good hypertension, + CAD, + Past MI and + Peripheral Vascular Disease  Normal cardiovascular exam Rhythm:Regular Rate:Normal  ECHO 24 1. Left ventricular ejection fraction, by estimation, is 60 to 65%. The  left ventricle has normal function. The left ventricle has no regional  wall motion abnormalities. There is severe left ventricular hypertrophy.  Left ventricular diastolic parameters   are indeterminate. Elevated left ventricular end-diastolic pressure.   2. Right ventricular systolic function is normal. The right ventricular  size is normal. Tricuspid regurgitation signal is inadequate for assessing  PA pressure.   3. Left atrial size was severely dilated.   4. Small pericardial effiusion along the right atrium, right ventricle,  left atrium and lateral to the left ventricle. Moderate pericardial  effusion posterior to the left ventricle.   5. The mitral valve is normal in structure. Trivial mitral valve  regurgitation. No evidence of mitral stenosis.   6. The aortic valve is tricuspid. Aortic valve regurgitation is not  visualized. No aortic stenosis is present.   7. Aortic dilatation noted. There is mild dilatation of the aortic root,  measuring 44 mm.   8. The inferior vena cava is dilated in size with >50% respiratory  variability, suggesting right atrial pressure of 8 mmHg.     Neuro/Psych Seizures -,   Anxiety     CVA negative neurological ROS  negative psych ROS    GI/Hepatic negative GI ROS, Neg liver ROS,GERD  ,,  Endo/Other  diabetes, Type 2    Renal/GU ESRFRenal diseasenegative Renal ROS  negative genitourinary   Musculoskeletal negative musculoskeletal ROS (+)    Abdominal   Peds negative pediatric ROS (+)  Hematology negative hematology ROS (+) Blood dyscrasia, anemia   Anesthesia Other Findings   Reproductive/Obstetrics negative OB ROS                             Anesthesia Physical Anesthesia Plan  ASA: 4  Anesthesia Plan: General   Post-op Pain Management: Ofirmev IV (intra-op)* and Dilaudid IV   Induction: Intravenous  PONV Risk Score and Plan: 2  Airway Management Planned: Oral ETT  Additional Equipment: None  Intra-op Plan:   Post-operative Plan: Extubation in OR  Informed Consent: I have reviewed the patients History and Physical, chart, labs and discussed the procedure including the risks, benefits and alternatives for the proposed anesthesia with the patient or authorized representative who has indicated his/her understanding and acceptance.       Plan Discussed with: Anesthesiologist and CRNA  Anesthesia Plan Comments: (  )       Anesthesia Quick Evaluation

## 2023-05-11 NOTE — Interval H&P Note (Signed)
 History and Physical Interval Note:  05/11/2023 6:48 AM  [name].  has presented today for surgery, with the diagnosis of Dehiscence Right Above Knee Amputation.  The various methods of treatment have been discussed with the patient and family. After consideration of risks, benefits and other options for treatment, the patient has consented to  Procedure(s) with comments: AMPUTATION, ABOVE KNEE (Right) - REVISION RIGHT ABOVE KNEE AMPUTATION as a surgical intervention.  The patient's history has been reviewed, patient examined, no change in status, stable for surgery.  I have reviewed the patient's chart and labs.  Questions were answered to the patient's satisfaction.     [name]

## 2023-05-11 NOTE — Progress Notes (Signed)
 PROGRESS NOTE  [name].  DOB: 06/16/1970  PCP: [name], FNP WUJ:[id number]  DOA: 05/05/2023  LOS: 6 days  Hospital Day: 7  Brief narrative: [name]. is a 53 y.o. male, resident of [facility] with PMH significant for ESRD HD TTS, recent right BKA ([name]), DM2, CAD, CVA, atrial fibrillation not on anticoagulation and seizure disorder. 3/13, patient presented to the ED at [facility] with seizure-like activity which occurred during dialysis.   Workup in the ED showed of large right pleural effusion.  Additionally, thinning of skin of his right BKA amputation stump was noted with a small area of focal ulceration.  The decision was made to transfer him to [facility] where he could be seen by [name] for possible revision of his right BKA stump.   PCCM was consulted to consider a possible thoracentesis.  Subjective: Patient was seen and examined this morning. Middle-aged African-American male.  Looks older for his age. Not in distress.  Pending revision of right AKA today.  Assessment and plan: Large right pleural effusion  3/13, thoracentesis in IR yielded 1.5 L of hazy amber fluid - felt to likely represent a parapneumonic effusion due to chronic aspiration, versus possible trauma related with history of recent falls per patient -empiric Unasyn was initiated  Chest x-ray postthoracentesis continue to show effusion. CT chest was obtained which showed a very large persisting right effusion 3/14, pigtail chest tube placed by pulmonary with improved drainage 3/15, lytics infused per chest tube -pleural effusion essentially resolved with follow-up chest x-ray noting no significant persisting effusion 3/17, chest tube was removed at bedside Cytology of pleural fluid did not show evidence of malignant cells   Right AKA stump wound dehiscence Seen by [name].   No evidence to suggest underlying osteomyelitis on plain films of the BKA site Pending of right AKA  today.   Seizure disorder suspected breakthrough seizure Neurology suggest continuing Keppra 500 mg daily with additional 250 mg on dialysis days after dialysis - clonazepam used at 0.5 mg twice daily for 3 days as a bridge while underlying infection being corrected    ESRD HD TTS Per nephrology.   Anemia of CKD Hemoglobin low mostly between 7 and 8.  Received 2 units of PRBC transfusion this hospitalization  Recent Labs    02/19/23 0259 02/21/23 0257 03/26/23 0526 03/29/23 1211 05/06/23 2011 05/07/23 0332 05/09/23 0755 05/10/23 0839 05/11/23 0820  HGB 8.0*   < > 7.8*   < > 8.3* 7.8* 7.9* 7.2* 8.5*  MCV 76.8*   < > 75.5*   < >  --  72.0* 71.2* 72.1* 73.2*  VITAMINB12  --   --  501  --   --   --   --   --   --   FOLATE  --   --  6.1  --   --   --   --   --   --   FERRITIN  --   --  820*  --   --   --   --   --   --   TIBC 134*  --  141*  --   --   --   --   --   --   IRON 34*  --  43*  --   --   --   --   --   --   RETICCTPCT  --   --  1.1  --   --   --   --   --   --    < > =  values in this interval not displayed.   Chronic loose stools Utilizing rectal tube for now but is becoming uncomfortable -plan will be to remove rectal tube after his surgery is completed 3/19   Chronic diastolic CHF CHF Recent echo with EF 55-60% Blood pressure and volume management per dialysis -no gross overload presently Currently on carvedilol 12.5 mg twice daily Midodrine on hold.  Blood pressure stable   CAD S/p  DES to LAD 2022 HLD Currently no anginal symptoms  Continue carvedilol, aspirin, statin    Chronic atrial fibrillation Rate controlled on amiodarone.   Not on anticoagulation -presumably due to anemia  Type 2 diabetes mellitus Hyperglycemia A1c 5 on 05/05/2023 PTA meds-none Currently on SSI/Accu-Cheks.  Blood sugar level low at 59 this afternoon.  Allowed to eat after procedure Recent Labs  Lab 05/11/23 0906 05/11/23 1112 05/11/23 1328 05/11/23 1424 05/11/23 1436   GLUCAP 77 73 59* 80 79    Mobility: Needs PT  Goals of care   Code Status: Full Code     DVT prophylaxis:  SCD's Start: 05/11/23 1519 heparin injection 5,000 Units Start: 05/05/23 2200 TED hose Start: 05/05/23 1023 SCDs Start: 05/05/23 1023   Antimicrobials: IV Ancef Fluid: None Consultants: Nephrology, orthopedics Family Communication: None at bedside  Status: Inpatient Level of care:  Med-Surg   Patient is from: [facility] rehab center Needs to continue in-hospital care: Surgery this afternoon Anticipated d/c to: Hopefully back to rehab in 1 to 2 days      Diet:  Diet Order             Diet renal with fluid restriction Fluid restriction: 1200 mL Fluid; Room service appropriate? Yes; Fluid consistency: Thin  Diet effective now                   Scheduled Meds:  amiodarone  100 mg Oral BH-q7a   ascorbic acid  1,000 mg Oral Daily   aspirin EC  81 mg Oral Daily   atorvastatin  80 mg Oral QHS   calcium acetate  1,334 mg Oral TID WC   carvedilol  12.5 mg Oral BID   Chlorhexidine Gluconate Cloth  6 each Topical Q0600   Chlorhexidine Gluconate Cloth  6 each Topical Q0600   cinacalcet  60 mg Oral Q T,Th,Sat-1800   [START ON 05/12/2023] darbepoetin (ARANESP) injection - DIALYSIS  100 mcg Subcutaneous Q Thu-1800   dorzolamide-timolol  1 drop Right Eye BID   Gerhardt's butt cream   Topical BID   heparin  5,000 Units Subcutaneous Q8H   hydrocortisone  1 Application Rectal BID   insulin aspart  0-6 Units Subcutaneous TID WC   levETIRAcetam  500 mg Oral Once per day on Sunday Monday Wednesday Friday   levETIRAcetam  750 mg Oral Once per day on Tuesday Thursday Saturday   lidocaine-prilocaine  1 Application Topical Once per day on Monday Wednesday Friday   multivitamin  1 tablet Oral QHS   nutrition supplement (JUVEN)  1 packet Oral BID BM   sodium chloride flush  10 mL Intrapleural Q8H   sodium chloride flush  3 mL Intravenous Q12H   zinc sulfate (50mg  elemental  zinc)  220 mg Oral Daily    PRN meds: acetaminophen **OR** [DISCONTINUED] acetaminophen, bisacodyl, hydrALAZINE, HYDROmorphone (DILAUDID) injection, ipratropium, levalbuterol, loperamide, ondansetron **OR** ondansetron (ZOFRAN) IV, oxyCODONE, senna-docusate, sodium chloride flush, zolpidem   Infusions:     Antimicrobials: Anti-infectives (From admission, onward)    Start     Dose/Rate Route  Frequency Ordered Stop   05/11/23 1405  vancomycin (VANCOCIN) powder  Status:  Discontinued          As needed 05/11/23 1405 05/11/23 1429   05/11/23 0600  ceFAZolin (ANCEF) IVPB 2g/100 mL premix        2 g 200 mL/hr over 30 Minutes Intravenous On call to O.R. 05/10/23 2131 05/11/23 1346   05/05/23 1800  Ampicillin-Sulbactam (UNASYN) 3 g in sodium chloride 0.9 % 100 mL IVPB        3 g 200 mL/hr over 30 Minutes Intravenous Every 24 hours 05/05/23 1643 05/10/23 0045       Objective: Vitals:   05/11/23 1445 05/11/23 1500  BP: 117/65 138/72  Pulse: 60 60  Resp: 11 19  Temp:  98.3 F (36.8 C)  SpO2: 92% 92%    Intake/Output Summary (Last 24 hours) at 05/11/2023 1608 Last data filed at 05/11/2023 1439 Gross per 24 hour  Intake 802 ml  Output 110 ml  Net 692 ml   Filed Weights   05/10/23 0833 05/10/23 1203 05/11/23 1126  Weight: 67.5 kg 69 kg 69 kg   Weight change:  Body mass index is 19.01 kg/m.   Physical Exam: General exam: Pleasant, middle-aged African-American male.  Looks older for his age Skin: No rashes, lesions or ulcers. HEENT: Atraumatic, normocephalic, no obvious bleeding Lungs: Clear to auscultation bilaterally,  CVS: S1, S2, no murmur,   GI/Abd: Soft, nontender, nondistended, bowel sound present,   CNS: Alert, awake, slow to respond.  Oriented x 3 Psychiatry: Mood appropriate,  Extremities: No pedal edema, no calf tenderness.  Right AKA stump with wound dehiscence prior to surgery  Data Review: I have personally reviewed the laboratory data and studies  available.  F/u labs ordered Unresulted Labs (From admission, onward)     Start     Ordered   05/12/23 0500  Renal function panel  Once,   R       Question:  Specimen collection method  Answer:  Lab=Lab collect   05/11/23 1120   05/12/23 0500  CBC with Differential/Platelet  Once,   R       Question:  Specimen collection method  Answer:  Lab=Lab collect   05/11/23 1120   05/11/23 0820  Pathologist smear review  Once,   AD        05/11/23 0820   05/05/23 2335  MRSA Next Gen by PCR, Nasal  Once,   R        05/05/23 2334           Total time spent in review of labs and imaging, patient evaluation, formulation of plan, documentation and communication with family: 55 minutes  Signed, Lorin Glass, MD [facility] 05/11/2023

## 2023-05-12 ENCOUNTER — Other Ambulatory Visit: Payer: Self-pay

## 2023-05-12 ENCOUNTER — Encounter (HOSPITAL_COMMUNITY): Payer: Self-pay | Admitting: Orthopedic Surgery

## 2023-05-12 DIAGNOSIS — J90 Pleural effusion, not elsewhere classified: Secondary | ICD-10-CM | POA: Diagnosis not present

## 2023-05-12 LAB — RENAL FUNCTION PANEL
Albumin: 1.9 g/dL — ABNORMAL LOW (ref 3.5–5.0)
Anion gap: 14 (ref 5–15)
BUN: 79 mg/dL — ABNORMAL HIGH (ref 6–20)
CO2: 24 mmol/L (ref 22–32)
Calcium: 8.9 mg/dL (ref 8.9–10.3)
Chloride: 100 mmol/L (ref 98–111)
Creatinine, Ser: 11.63 mg/dL — ABNORMAL HIGH (ref 0.61–1.24)
GFR, Estimated: 5 mL/min — ABNORMAL LOW (ref >60)
Glucose, Bld: 80 mg/dL (ref 70–99)
Phosphorus: 8.1 mg/dL — ABNORMAL HIGH (ref 2.5–4.6)
Potassium: 5.5 mmol/L — ABNORMAL HIGH (ref 3.5–5.1)
Sodium: 138 mmol/L (ref 135–145)

## 2023-05-12 LAB — CBC WITH DIFFERENTIAL/PLATELET
Abs Immature Granulocytes: 0.05 10*3/uL (ref 0.00–0.07)
Basophils Absolute: 0.1 10*3/uL (ref 0.0–0.1)
Basophils Relative: 1 %
Eosinophils Absolute: 0.4 10*3/uL (ref 0.0–0.5)
Eosinophils Relative: 3 %
HCT: 24.7 % — ABNORMAL LOW (ref 39.0–52.0)
Hemoglobin: 7.4 g/dL — ABNORMAL LOW (ref 13.0–17.0)
Immature Granulocytes: 0 %
Lymphocytes Relative: 10 %
Lymphs Abs: 1.3 10*3/uL (ref 0.7–4.0)
MCH: 22.2 pg — ABNORMAL LOW (ref 26.0–34.0)
MCHC: 30 g/dL (ref 30.0–36.0)
MCV: 74 fL — ABNORMAL LOW (ref 80.0–100.0)
Monocytes Absolute: 1.7 10*3/uL — ABNORMAL HIGH (ref 0.1–1.0)
Monocytes Relative: 13 %
Neutro Abs: 9.4 10*3/uL — ABNORMAL HIGH (ref 1.7–7.7)
Neutrophils Relative %: 73 %
Platelets: 210 10*3/uL (ref 150–400)
RBC: 3.34 MIL/uL — ABNORMAL LOW (ref 4.22–5.81)
RDW: 22 % — ABNORMAL HIGH (ref 11.5–15.5)
Smear Review: ADEQUATE
WBC: 12.9 10*3/uL — ABNORMAL HIGH (ref 4.0–10.5)
nRBC: 0 % (ref 0.0–0.2)

## 2023-05-12 LAB — PATHOLOGIST SMEAR REVIEW

## 2023-05-12 MED ORDER — LIDOCAINE HCL (PF) 1 % IJ SOLN: 5.0000 mL | INTRAMUSCULAR | Status: DC | PRN

## 2023-05-12 MED ORDER — HEPARIN SODIUM (PORCINE) 1000 UNIT/ML DIALYSIS: 1000.0000 [IU] | INTRAMUSCULAR | Status: DC | PRN

## 2023-05-12 MED ORDER — ALTEPLASE 2 MG IJ SOLR: 2.0000 mg | Freq: Once | INTRAMUSCULAR | Status: DC | PRN

## 2023-05-12 MED ORDER — LIDOCAINE-PRILOCAINE 2.5-2.5 % EX CREA: 1.0000 | TOPICAL_CREAM | CUTANEOUS | Status: DC | PRN

## 2023-05-12 MED ORDER — PENTAFLUOROPROP-TETRAFLUOROETH EX AERO: 1.0000 | INHALATION_SPRAY | CUTANEOUS | Status: DC | PRN

## 2023-05-12 MED ORDER — SUCROFERRIC OXYHYDROXIDE 500 MG PO CHEW
500.0000 mg | CHEWABLE_TABLET | Freq: Three times a day (TID) | ORAL | Status: DC
  Administered 2023-05-13 – 2023-05-17 (×12): 500 mg via ORAL
  Filled 2023-05-12 (×14): qty 1

## 2023-05-12 NOTE — Progress Notes (Signed)
 Floor informed dialysis unit that patient currently refusing treatment. Informed floor to call back if patient agrees to dialysis.

## 2023-05-12 NOTE — Plan of Care (Signed)
 Alert and oriented.  Rectal tube removed today.  Able to have small smear of bowel movement.  Refused blood glucose monitoring and insulin.  Went to hemodialysis but did not complete full treatment time.   Problem: Education: Goal: Knowledge of General Education information will improve Description: Including pain rating scale, medication(s)/side effects and non-pharmacologic comfort measures Outcome: Progressing   Problem: Health Behavior/Discharge Planning: Goal: Ability to manage health-related needs will improve Outcome: Progressing   Problem: Clinical Measurements: Goal: Ability to maintain clinical measurements within normal limits will improve Outcome: Progressing Goal: Will remain free from infection Outcome: Progressing Goal: Diagnostic test results will improve Outcome: Progressing

## 2023-05-12 NOTE — Progress Notes (Addendum)
 [facility] Progress Note   Subjective:    Seen and examined patient at bedside. Unfortunately, he refused labs and HD earlier today. K+ levels have been elevated here. Noted both funyons and fritos (equal 160mg  potassium) at his bedside. Discussed with him on the importance of HD to stabilize his K+. He now agrees to go. Plan for HD this afternoon. Plan for discharge to SNF once clinically stable. S/p revision of R AKA yesterday by [name].  Objective Vitals:   05/12/23 0500 05/12/23 0624 05/12/23 0854 05/12/23 0855  BP:  (!) 147/77 (!) 131/50 (!) 131/50  Pulse:  73 98 98  Resp:  18 18   Temp:  98.4 F (36.9 C)    TempSrc:      SpO2:  90% 96%   Weight: 69.1 kg     Height:       Physical Exam General: Alert male in NAD Heart: RRR, no murmur Lungs: Respirations unlabored on RA, CTA anteriorly Abdomen: Non-distended Extremities: No peripheral edema Dialysis Access: AVF (+) B/T  Filed Weights   05/10/23 1203 05/11/23 1126 05/12/23 0500  Weight: 69 kg 69 kg 69.1 kg    Intake/Output Summary (Last 24 hours) at 05/12/2023 1202 Last data filed at 05/12/2023 1022 Gross per 24 hour  Intake 303 ml  Output 210 ml  Net 93 ml    Additional Objective Labs: Basic Metabolic Panel: Recent Labs  Lab 05/09/23 0755 05/10/23 0839 05/11/23 0820  NA 138 138 140  K 5.9* 6.0* 5.6*  CL 104 105 102  CO2 21* 19* 23  GLUCOSE 83 70 77  BUN 73* 83* 60*  CREATININE 11.74* 13.39* 9.93*  CALCIUM 8.5* 8.4* 8.4*  PHOS 9.5* 10.3* 7.5*   Liver Function Tests: Recent Labs  Lab 05/09/23 0755 05/10/23 0839 05/11/23 0820  ALBUMIN 1.9* 1.9* 1.9*   No results for input(s): "LIPASE", "AMYLASE" in the last 168 hours. CBC: Recent Labs  Lab 05/06/23 0904 05/06/23 2011 05/07/23 0332 05/09/23 0755 05/10/23 0839 05/11/23 0820  WBC 7.3  --  7.5 9.5 8.3 9.6  NEUTROABS  --   --   --   --   --  5.8  HGB 6.8*   < > 7.8* 7.9* 7.2* 8.5*  HCT 23.0*   < > 25.4* 26.4* 24.5* 27.8*   MCV 71.9*  --  72.0* 71.2* 72.1* 73.2*  PLT 237  --  235 243 248 240   < > = values in this interval not displayed.   Blood Culture    Component Value Date/Time   SDES FLUID PLEURAL RIGHT 05/05/2023 1443   SDES FLUID PLEURAL RIGHT 05/05/2023 1443   SPECREQUEST BOTTLES DRAWN AEROBIC AND ANAEROBIC 05/05/2023 1443   SPECREQUEST NONE 05/05/2023 1443   CULT  05/05/2023 1443    NO GROWTH 5 DAYS Performed at [facility], [address], [place], [place] [address]    REPTSTATUS 05/10/2023 FINAL 05/05/2023 1443   REPTSTATUS 05/05/2023 FINAL 05/05/2023 1443    Cardiac Enzymes: No results for input(s): "CKTOTAL", "CKMB", "CKMBINDEX", "TROPONINI" in the last 168 hours. CBG: Recent Labs  Lab 05/11/23 1328 05/11/23 1424 05/11/23 1436 05/11/23 1656 05/11/23 2036  GLUCAP 59* 80 79 127* 131*   Iron Studies: No results for input(s): "IRON", "TIBC", "TRANSFERRIN", "FERRITIN" in the last 72 hours. Lab Results  Component Value Date   INR 1.2 05/06/2023   INR 1.2 05/05/2023   INR 1.1 03/01/2022   Studies/Results: DG Chest Port 1 View Result Date: 05/11/2023  CLINICAL DATA:  Pleural effusion. EXAM: PORTABLE CHEST 1 VIEW COMPARISON:  Chest radiograph dated 05/09/2023. FINDINGS: Interval removal of the right chest tube. Small right pleural effusion with right lung base atelectasis. Small fluid noted in the right fissure. No pneumothorax. Stable mild cardiomegaly. Coronary vascular calcification. No acute osseous pathology. IMPRESSION: 1. Interval removal of the right chest tube. No pneumothorax. 2. Small right pleural effusion with right lung base atelectasis. Electronically Signed   By: Elgie Collard M.D.   On: 05/11/2023 09:51    Medications:   amiodarone  100 mg Oral BH-q7a   ascorbic acid  1,000 mg Oral Daily   aspirin EC  81 mg Oral Daily   atorvastatin  80 mg Oral QHS   calcium acetate  1,334 mg Oral TID WC   carvedilol  12.5 mg Oral BID   Chlorhexidine Gluconate Cloth  6  each Topical Q0600   Chlorhexidine Gluconate Cloth  6 each Topical Q0600   cinacalcet  60 mg Oral Q T,Th,Sat-1800   darbepoetin (ARANESP) injection - DIALYSIS  100 mcg Subcutaneous Q Thu-1800   dorzolamide-timolol  1 drop Right Eye BID   Gerhardt's butt cream   Topical BID   heparin  5,000 Units Subcutaneous Q8H   hydrocortisone  1 Application Rectal BID   insulin aspart  0-6 Units Subcutaneous TID WC   levETIRAcetam  500 mg Oral Once per day on Sunday Monday Wednesday Friday   levETIRAcetam  750 mg Oral Once per day on Tuesday Thursday Saturday   lidocaine-prilocaine  1 Application Topical Once per day on Monday Wednesday Friday   multivitamin  1 tablet Oral QHS   nutrition supplement (JUVEN)  1 packet Oral BID BM   sodium chloride flush  10 mL Intrapleural Q8H   sodium chloride flush  3 mL Intravenous Q12H   zinc sulfate (50mg  elemental zinc)  220 mg Oral Daily    Dialysis Orders: [facility] TTS. 3 hours 15 minutes. EDW: 77.5 kg (however likely well under this as per outpatient unit). LUE AVF, 15-gauge needles. Flow rates: 400/800. 37 degrees. 2K/2.5 calcium. UF profile #2. Heparin: Loading 1000 units, 400 units/h. Meds: Mircera 100 mcg every 2 weeks (last dose 3/4)   Assessment/Plan: 1. R pleural effusion: S/p thoracentesis with IR on 3/13 2. Dehiscence R AKA - S/p revision 3/19 by [name].  3. Hyperkalemia -K+ levels elevated here. Noted both Fritos and Funyons at his bedside (160mg  worth of potassium). I again explained to him on the importance of following a low-potassium diet. Monitor trends. 4. ESRD: HD TTS. Next HD this afternoon. UF as tolerated 5. HTN/volume: Remains euvolemic on exam. Bps stable now but dropped with HD 3/18. Monitor on treatment today. 6. Anemia: S/p 2 units PRBCs total since admit (3/13 and 3/18). Hgb now 8.5. Last mircera dose on 04/26/23, will give next dose with HD today. 7. Secondary hyperparathyroidism: Corr Ca 10.08 and phos is trending. Hold  Calcitriol for now. Will switch binder to Velphoro. Continue sensipar 8. Nutrition:  Alb low, continue protein supplements  9. Dispo - Plan for discharge to SNF one patient is medically stable  Salome Holmes, NP [facility] 05/12/2023,12:02 PM  LOS: 7 days

## 2023-05-12 NOTE — Progress Notes (Signed)
 Patient ID: [name] Barns., male   DOB: January 19, 1971, 53 y.o.   MRN: [id number] Patient is a 53 year old gentleman who is seen in follow-up status post revision right above-knee amputation.  Patient has no complaints.  The dressing is clean and dry.  I will follow-up in the office in 1 week.  Patient may discharge from orthopedic standpoint at this time.

## 2023-05-12 NOTE — Progress Notes (Addendum)
 PROGRESS NOTE  [name].  DOB: 1970-07-03  PCP: [name], FNP GNF:[id number]  DOA: 05/05/2023  LOS: 7 days  Hospital Day: 8  Brief narrative: [name] Barns. is a 53 y.o. male, resident of [facility] with PMH significant for ESRD HD TTS, recent right BKA ([name]), DM2, CAD, CVA, atrial fibrillation not on anticoagulation and seizure disorder. 3/13, patient presented to the ED at [facility] with seizure-like activity which occurred during dialysis.   Workup in the ED showed of large right pleural effusion.  Additionally, thinning of skin of his right BKA amputation stump was noted with a small area of focal ulceration.  The decision was made to transfer him to [facility] where he could be seen by [name] for possible revision of his right BKA stump.   PCCM was consulted to consider a possible thoracentesis.  Subjective: Patient was seen and examined this morning. Middle-aged African-American male.  Looks older for his age. Not in distress.  Underwent right AKA yesterday. It is his regular dialysis day today but he refused this morning.  I tried to address this with him again but he did not want to listen to me.  Later agreed with nephrology.  Assessment and plan: Large right pleural effusion  3/13, thoracentesis in IR yielded 1.5 L of hazy amber fluid - felt to likely represent a parapneumonic effusion due to chronic aspiration, versus possible trauma related with history of recent falls per patient -empiric Unasyn was initiated  Chest x-ray postthoracentesis continue to show effusion. CT chest was obtained which showed a very large persisting right effusion 3/14, pigtail chest tube placed by pulmonary with improved drainage 3/15, lytics infused per chest tube -pleural effusion essentially resolved with follow-up chest x-ray noting no significant persisting effusion 3/17, chest tube was removed at bedside Cytology of pleural fluid did not show evidence of  malignant cells   Right AKA stump wound dehiscence Seen by [name]. 3/19, underwent right AKA revision.   Seizure disorder suspected breakthrough seizure Neurology suggested continuing Keppra 500 mg daily with additional 250 mg on dialysis days after dialysis - clonazepam used at 0.5 mg twice daily for 3 days as a bridge while underlying infection being corrected    ESRD HD TTS Per nephrology. It is his regular dialysis day today but he refused this morning.  I tried to address this with him again but he did not want to listen to me.  Later agreed with nephrology.   Anemia of CKD Hemoglobin low mostly between 7 and 8.  Received 2 units of PRBC transfusion this hospitalization  Recent Labs    02/19/23 0259 02/21/23 0257 03/26/23 0526 03/29/23 1211 05/06/23 2011 05/07/23 0332 05/09/23 0755 05/10/23 0839 05/11/23 0820  HGB 8.0*   < > 7.8*   < > 8.3* 7.8* 7.9* 7.2* 8.5*  MCV 76.8*   < > 75.5*   < >  --  72.0* 71.2* 72.1* 73.2*  VITAMINB12  --   --  501  --   --   --   --   --   --   FOLATE  --   --  6.1  --   --   --   --   --   --   FERRITIN  --   --  820*  --   --   --   --   --   --   TIBC 134*  --  141*  --   --   --   --   --   --  IRON 34*  --  43*  --   --   --   --   --   --   RETICCTPCT  --   --  1.1  --   --   --   --   --   --    < > = values in this interval not displayed.   Chronic loose stools Remove rectal tube today   Chronic diastolic CHF CHF Recent echo with EF 55-60% Blood pressure and volume management per dialysis -no gross overload presently Currently on carvedilol 12.5 mg twice daily Midodrine on hold.  Blood pressure stable   CAD S/p  DES to LAD 2022 HLD Currently no anginal symptoms  Continue carvedilol, aspirin, statin    Chronic atrial fibrillation Rate controlled on amiodarone.   Not on anticoagulation -presumably due to anemia  Type 2 diabetes mellitus Hyperglycemia A1c 5 on 05/05/2023 PTA meds-none Currently on SSI/Accu-Cheks.   Blood sugar level low at 59 this afternoon.  Allowed to eat after procedure Recent Labs  Lab 05/11/23 1328 05/11/23 1424 05/11/23 1436 05/11/23 1656 05/11/23 2036  GLUCAP 59* 80 79 127* 131*   Mobility: Needs PT  Goals of care   Code Status: Full Code     DVT prophylaxis:  SCD's Start: 05/11/23 1519 heparin injection 5,000 Units Start: 05/05/23 2200 TED hose Start: 05/05/23 1023 SCDs Start: 05/05/23 1023   Antimicrobials: None currently Fluid: None Consultants: Nephrology, orthopedics Family Communication: None at bedside  Status: Inpatient Level of care:  Med-Surg   Patient is from: [facility] rehab center for short-term rehab Needs to continue in-hospital care: Medically stable. Anticipated d/c to: Hopefully back to rehab in 1 to 2 days. Pending Research scientist (life sciences).      Diet:  Diet Order             Diet renal with fluid restriction Fluid restriction: 1200 mL Fluid; Room service appropriate? Yes; Fluid consistency: Thin  Diet effective now                   Scheduled Meds:  amiodarone  100 mg Oral BH-q7a   ascorbic acid  1,000 mg Oral Daily   aspirin EC  81 mg Oral Daily   atorvastatin  80 mg Oral QHS   carvedilol  12.5 mg Oral BID   Chlorhexidine Gluconate Cloth  6 each Topical Q0600   Chlorhexidine Gluconate Cloth  6 each Topical Q0600   cinacalcet  60 mg Oral Q T,Th,Sat-1800   darbepoetin (ARANESP) injection - DIALYSIS  100 mcg Subcutaneous Q Thu-1800   dorzolamide-timolol  1 drop Right Eye BID   [name]'s butt cream   Topical BID   heparin  5,000 Units Subcutaneous Q8H   hydrocortisone  1 Application Rectal BID   insulin aspart  0-6 Units Subcutaneous TID WC   levETIRAcetam  500 mg Oral Once per day on Sunday Monday Wednesday Friday   levETIRAcetam  750 mg Oral Once per day on Tuesday Thursday Saturday   lidocaine-prilocaine  1 Application Topical Once per day on Monday Wednesday Friday   multivitamin  1 tablet Oral QHS   nutrition  supplement (JUVEN)  1 packet Oral BID BM   sodium chloride flush  10 mL Intrapleural Q8H   sodium chloride flush  3 mL Intravenous Q12H   sucroferric oxyhydroxide  500 mg Oral TID WC   zinc sulfate (50mg  elemental zinc)  220 mg Oral Daily    PRN meds: acetaminophen **OR** [DISCONTINUED] acetaminophen, alteplase, bisacodyl,  heparin, hydrALAZINE, HYDROmorphone (DILAUDID) injection, ipratropium, levalbuterol, lidocaine (PF), lidocaine-prilocaine, loperamide, ondansetron **OR** ondansetron (ZOFRAN) IV, oxyCODONE, pentafluoroprop-tetrafluoroeth, senna-docusate, sodium chloride flush, zolpidem   Infusions:     Antimicrobials: Anti-infectives (From admission, onward)    Start     Dose/Rate Route Frequency Ordered Stop   05/11/23 1405  vancomycin (VANCOCIN) powder  Status:  Discontinued          As needed 05/11/23 1405 05/11/23 1429   05/11/23 0600  ceFAZolin (ANCEF) IVPB 2g/100 mL premix        2 g 200 mL/hr over 30 Minutes Intravenous On call to O.R. 05/10/23 2131 05/11/23 1346   05/05/23 1800  Ampicillin-Sulbactam (UNASYN) 3 g in sodium chloride 0.9 % 100 mL IVPB        3 g 200 mL/hr over 30 Minutes Intravenous Every 24 hours 05/05/23 1643 05/10/23 0045       Objective: Vitals:   05/12/23 0854 05/12/23 0855  BP: (!) 131/50 (!) 131/50  Pulse: 98 98  Resp: 18   Temp:    SpO2: 96%     Intake/Output Summary (Last 24 hours) at 05/12/2023 1331 Last data filed at 05/12/2023 1022 Gross per 24 hour  Intake 303 ml  Output 210 ml  Net 93 ml   Filed Weights   05/10/23 1203 05/11/23 1126 05/12/23 0500  Weight: 69 kg 69 kg 69.1 kg   Weight change: 1.5 kg Body mass index is 19.04 kg/m.   Physical Exam: General exam: Pleasant, middle-aged African-American male.  Looks older for his age Skin: No rashes, lesions or ulcers. HEENT: Atraumatic, normocephalic, no obvious bleeding Lungs: Clear to auscultation bilaterally,  CVS: S1, S2, no murmur,   GI/Abd: Soft, nontender,  nondistended, bowel sound present,   CNS: Alert, awake, slow to respond.  Oriented x 3 Psychiatry: Mood appropriate,  Extremities: No pedal edema, no calf tenderness.  Right AKA stump with wound VAC attached.  Data Review: I have personally reviewed the laboratory data and studies available.  F/u labs ordered Unresulted Labs (From admission, onward)     Start     Ordered   05/12/23 0500  Renal function panel  Once,   R       Question:  Specimen collection method  Answer:  Lab=Lab collect   05/11/23 1120   05/12/23 0500  CBC with Differential/Platelet  Once,   R       Question:  Specimen collection method  Answer:  Lab=Lab collect   05/11/23 1120   05/11/23 0820  Pathologist smear review  Once,   AD        05/11/23 0820           Total time spent in review of labs and imaging, patient evaluation, formulation of plan, documentation and communication with family: 45 minutes  Signed, Lorin Glass, MD [facility] 05/12/2023

## 2023-05-12 NOTE — Progress Notes (Signed)
 Transported off unit to Hemodialysis at this time.

## 2023-05-12 NOTE — TOC Progression Note (Signed)
 Transition of Care (TOC) - Progression Note    Patient Details  Name: [name]. MRN: [id number] Date of Birth: January 11, 1971  Transition of Care [facility]) CM/SW Contact  Dellie Burns [place], [place] Phone Number: 05/12/2023, 11:17 AM  Clinical Narrative:  Per MD, pt close to being medically stable for dc to SNF. [facility] auth request submitted, reference # [id number]. Will provide updates as available.   Dellie Burns, MSW, LCSW [phone] (coverage)       Expected Discharge Plan: Skilled Nursing Facility Barriers to Discharge: Continued Medical Work up  Expected Discharge Plan and Services In-house Referral: Clinical Social Work   Post Acute Care Choice: Skilled Nursing Facility Living arrangements for the past 2 months: Skilled Nursing Facility, Single Family Home                                       Social Determinants of Health (SDOH) Interventions SDOH Screenings   Food Insecurity: No Food Insecurity (05/05/2023)  Housing: High Risk (05/05/2023)  Transportation Needs: No Transportation Needs (05/05/2023)  Utilities: Not At Risk (05/05/2023)  Financial Resource Strain: High Risk (07/05/2017)  Physical Activity: Inactive (07/05/2017)  Social Connections: Socially Integrated (05/05/2023)  Stress: Stress Concern Present (07/05/2017)  Tobacco Use: Low Risk  (05/11/2023)  Health Literacy: Low Risk  (06/01/2020)   Received from [facility], [facility]    Readmission Risk Interventions    07/27/2021   11:49 AM 11/27/2020    3:06 PM  Readmission Risk Prevention Plan  Transportation Screening Complete Complete  PCP or Specialist Appt within 3-5 Days  Complete  HRI or Home Care Consult  Complete  Social Work Consult for Recovery Care Planning/Counseling  Complete  Palliative Care Screening  Not Applicable  Medication Review Oceanographer) Complete Complete  SW Recovery Care/Counseling Consult Complete   Palliative Care Screening Not  Applicable   Skilled Nursing Facility Not Applicable

## 2023-05-12 NOTE — Evaluation (Signed)
 Physical Therapy Re-Evaluation Patient Details Name: [name]. MRN: [id number] DOB: May 16, 1970 Today's Date: 05/12/2023  History of Present Illness  53 year old admitted 3/13 found to have large right pleural effusion, recent right AKA on 04/06/2023 with new stump ulceration, suspected breakthrough seizure. Chest tube placed (right) 3/14 and subsequently removed.  Now s/p AKA revision on 05/11/23. PMHx: ESRD on HD TTS, recent right BKA ([name]), CVA, atrial fibrillation not on anticoagulation, DM2, and seizure disorder.  Clinical Impression  Patient presents s/p R AKA revision with limited activity tolerance, generalized weakness, decreased sitting balance and decreased midline awareness.  Previously mobilizing on EOB and attempted sit to stand on prior PT evaluation, still needing +2 A.  Today focus on sitting balance, trunk activation in sitting for improved midline positioning and for R LE therex.  He will benefit from continued skilled PT in the acute setting and from post-acute inpatient rehab (<3 hours/day) prior to d/c home.         If plan is discharge home, recommend the following: Two people to help with walking and/or transfers;Two people to help with bathing/dressing/bathroom;Direct supervision/assist for medications management;Help with stairs or ramp for entrance;Assist for transportation;Assistance with cooking/housework;Direct supervision/assist for financial management;Supervision due to cognitive status   Can travel by private vehicle   No    Equipment Recommendations None recommended by PT  Recommendations for Other Services       Functional Status Assessment Patient has had a recent decline in their functional status and demonstrates the ability to make significant improvements in function in a reasonable and predictable amount of time.     Precautions / Restrictions Precautions Precautions: Fall Restrictions Weight Bearing Restrictions Per Provider Order:  Yes LLE Weight Bearing Per Provider Order: Non weight bearing      Mobility  Bed Mobility Overal bed mobility: Needs Assistance Bed Mobility: Supine to Sit Rolling: +2 for physical assistance, Max assist   Supine to sit: Total assist, +2 for physical assistance, +2 for safety/equipment Sit to supine: Mod assist   General bed mobility comments: Pt needing assist with bed pads to get to [facility], limited by R AKA pain. Pt also needing assist with positioning R AKA into bed when returning to supine. Total A+2 with bed pads for scooting to [facility]    Transfers Overall transfer level: Needs assistance       Stand pivot transfers: Max assist, +2 physical assistance         General transfer comment: attempts to scoot toward HOB in sitting. pt with LOB each time attempted with +2 max A so returned to supine to scoot up    Ambulation/Gait                  Stairs            Wheelchair Mobility     Tilt Bed    Modified Rankin (Stroke Patients Only)       Balance Overall balance assessment: Needs assistance Sitting-balance support: Feet supported, Bilateral upper extremity supported Sitting balance-Leahy Scale: Poor Sitting balance - Comments: Min A to Mod A, occsaional BUE support against bed edge vs single UE support against bed rail; performed seated reaching to retrieve/place med cup in therapist hand forward and to R for increased R side weight shift; attempted facilitation through L ribs due to trunk elongation on L for weight shift R and pt c/o pain in R leg Postural control: Left lateral lean  Pertinent Vitals/Pain Pain Assessment Pain Assessment: Faces Faces Pain Scale: Hurts even more Pain Location: R AKA revision with mobility Pain Descriptors / Indicators: Aching, Discomfort, Grimacing, Guarding, Sore Pain Intervention(s): Monitored during session, Repositioned, Limited activity within patient's tolerance     Home Living Family/patient expects to be discharged to:: Skilled nursing facility Living Arrangements: Spouse/significant other Available Help at Discharge: Family;Available PRN/intermittently Type of Home: House Home Access: Ramped entrance       Home Layout: One level Home Equipment: Rollator (4 wheels);Rolling Walker (2 wheels);BSC/3in1      Prior Function Prior Level of Function : Needs assist             Mobility Comments: Per chart: Household ambulator using Rollator prior to R BKA in Dec. Has been at [facility], progressing to standing with therapy but has not completed full stand yet. Staff use hoyer lift for transfers and assist for sitting balance. ADLs Comments: Staff help with BADL's     Extremity/Trunk Assessment   Upper Extremity Assessment Upper Extremity Assessment: Defer to OT evaluation    Lower Extremity Assessment RLE Deficits / Details: R AKA wrapped with gauze and coban; needing assist for proprioception as not seemingly aware he is holding it up and needing cues to place it on EOB whien sitting. RLE: Unable to fully assess due to pain RLE Sensation: WNL LLE Deficits / Details: L heel wrapped with gauze, noted strength grossly 4-/5 LLE Sensation: decreased light touch    Cervical / Trunk Assessment Cervical / Trunk Assessment: Kyphotic  Communication   Communication Communication: No apparent difficulties    Cognition Arousal: Alert Behavior During Therapy: Flat affect   PT - Cognitive impairments: Problem solving, Safety/Judgement                       PT - Cognition Comments: slow processing, some limited safety awareness; wife not present, unknown baseline   Following commands impaired: Only follows one step commands consistently, Follows one step commands with increased time     Cueing Cueing Techniques: Verbal cues, Tactile cues     General Comments      Exercises Amputee Exercises Towel Squeeze: Strengthening, Both,  Seated (seated with pillow between legs, limtied due to pain in R AKA) Hip Extension: 5 reps, Right, Seated (pushing down into mattress with R LE)   Assessment/Plan    PT Assessment Patient needs continued PT services  PT Problem List Decreased strength;Decreased activity tolerance;Decreased balance;Decreased mobility;Decreased knowledge of use of DME;Decreased cognition;Decreased safety awareness;Decreased knowledge of precautions;Pain;Decreased skin integrity       PT Treatment Interventions DME instruction;Gait training;Functional mobility training;Therapeutic activities;Therapeutic exercise;Balance training;Patient/family education;Neuromuscular re-education;Cognitive remediation;Wheelchair mobility training;Modalities    PT Goals (Current goals can be found in the Care Plan section)  Acute Rehab PT Goals Patient Stated Goal: Improve strength, independence. PT Goal Formulation: With patient Time For Goal Achievement: 05/26/23 Potential to Achieve Goals: Fair    Frequency Min 2X/week     Co-evaluation PT/OT/SLP Co-Evaluation/Treatment: Yes Reason for Co-Treatment: To address functional/ADL transfers;Complexity of the patient's impairments (multi-system involvement);For patient/therapist safety PT goals addressed during session: Mobility/safety with mobility;Balance;Strengthening/ROM OT goals addressed during session: Strengthening/ROM       AM-PAC PT "6 Clicks" Mobility  Outcome Measure Help needed turning from your back to your side while in a flat bed without using bedrails?: Total Help needed moving from lying on your back to sitting on the side of a flat bed without using bedrails?: Total Help needed  moving to and from a bed to a chair (including a wheelchair)?: Total Help needed standing up from a chair using your arms (e.g., wheelchair or bedside chair)?: Total Help needed to walk in hospital room?: Total Help needed climbing 3-5 steps with a railing? : Total 6 Click  Score: 6    End of Session   Activity Tolerance: Patient limited by pain Patient left: in bed;with call bell/phone within reach;with bed alarm set   PT Visit Diagnosis: Other abnormalities of gait and mobility (R26.89);Pain;Muscle weakness (generalized) (M62.81) Pain - Right/Left: Right Pain - part of body: Leg    Time: 1200-1238 PT Time Calculation (min) (ACUTE ONLY): 38 min   Charges:   PT Evaluation $PT Re-evaluation: 1 Re-eval PT Treatments $Therapeutic Activity: 8-22 mins PT General Charges $$ ACUTE PT VISIT: 1 Visit         Sheran Lawless, PT Acute Rehabilitation Services Office:[phone] 05/12/2023   Elray Mcgregor 05/12/2023, 3:50 PM

## 2023-05-12 NOTE — Progress Notes (Addendum)
 Received patient in bed to unit.  Alert and oriented.  Informed consent signed and in chart.   TX duration: 1 hour and 56 minutes.  Patient irrate and terminated session early even after advising patient having a high potassium.  AMA paperwork signed.  Dr. Arlean Hopping informed.  Patient did not want me to check his bottom, wants Tech on floor to clean.   Transported back to the room  Alert, without acute distress.  Hand-off given to patient's nurse.   Access used: Left AV Fistula Access issues: none  Total UF removed: Medication(s) given: Oxycodone   05/12/23 1647  Vitals  Temp 98.7 F (37.1 C)  Temp Source Oral  BP (!) 146/70  MAP (mmHg) 93  Pulse Rate 66  ECG Heart Rate 68  Resp (!) 7  Oxygen Therapy  SpO2 97 %  O2 Device Room Air  During Treatment Monitoring  Duration of HD Treatment -hour(s) 1.93 hour(s)  Cumulative Fluid Removed (mL) per Treatment  324.26  HD Safety Checks Performed Yes  Intra-Hemodialysis Comments See progress note (Patient terminated session AMA paperwork signed)  Dialysis Fluid Bolus Normal Saline  Bolus Amount (mL) 300 mL  Post Treatment  Dialyzer Clearance Lightly streaked  Liters Processed 46.4  Fluid Removed (mL) 300 mL  Tolerated HD Treatment No (Comment)  Post-Hemodialysis Comments Patient came off early.  AMA paperwork signed  AVG/AVF Arterial Site Held (minutes) 7 minutes  AVG/AVF Venous Site Held (minutes) 7 minutes  Fistula / Graft Left Upper arm  No placement date or time found.   Orientation: Left  Access Location: Upper arm  Status Deaccessed     Stacie Glaze LPN [facility] Dialysis [facility]

## 2023-05-12 NOTE — Progress Notes (Signed)
 Patient Transporter came to bedside to pick up patient for hemodialysis.  [name] refusing to go to Hemodialysis at this time. States he does not feel well.  Will notify Dr. Pola Corn    05/12/23 3329  Provider Notification  Provider Name/Title Dr. Dahal/ Attending  Date Provider Notified 05/12/23  Time Provider Notified 0745  Method of Notification Page  Notification Reason Medical equipment refusal (Refused Hemodialysis today)  Medical equipment refused/Pt. educated regarding refusal Other (Comment) (Refused Hemodialyis today)  Provider response No new orders

## 2023-05-12 NOTE — Progress Notes (Signed)
 Patient arrived back to 70M from Hemodialysis unit.

## 2023-05-12 NOTE — Progress Notes (Signed)
 Occupational Therapy Treatment Patient Details Name: [name]. MRN: [id number] DOB: 05-11-1970 Today's Date: 05/12/2023   History of present illness 53 year old admitted 3/13 found to have large right pleural effusion, recent right AKA on 04/06/2023 with new stump ulceration, suspected breakthrough seizure. Chest tube placed (right) 3/14. PMHx: ESRD on HD TTS, recent right BKA ([name]), CVA, atrial fibrillation not on anticoagulation, DM2, and seizure disorder.   OT comments  Cotx session with PT to progress pt with balance and strengthening. S/p R BKA revision, pt needing more assist with bed mobility, R BKA generally weak. Pt also noted to have more impaired sitting balance than on IE with a L lateral lean, likely keeping pressure off his R side. Worked with pt on achieving midline sitting posture and balance, still requires UE support and some physical assist to maintain. OT to continue to progress pt as able. DC plans remain appropriate for skilled rehab with <3 hour of inpatient therapy.       If plan is discharge home, recommend the following:  Two people to help with walking and/or transfers;A lot of help with bathing/dressing/bathroom;Assistance with cooking/housework;Assist for transportation;Help with stairs or ramp for entrance   Equipment Recommendations  Hoyer lift;Hospital bed;Wheelchair (measurements OT);Wheelchair cushion (measurements OT)    Recommendations for Other Services      Precautions / Restrictions Precautions Precautions: Fall Recall of Precautions/Restrictions: Intact Precaution/Restrictions Comments: R AKA Restrictions Weight Bearing Restrictions Per Provider Order: No       Mobility Bed Mobility Overal bed mobility: Needs Assistance Bed Mobility: Supine to Sit, Sit to Supine     Supine to sit: Total assist, +2 for physical assistance, +2 for safety/equipment Sit to supine: Mod assist   General bed mobility comments: Pt needing assist with  bed pads to get to [facility], limited by R BKA pain. Pt also needing assist with positioning R BKA into bed when returning to supine. Total A+2 with bed pads for scooting to [facility]    Transfers                         Balance Overall balance assessment: Needs assistance Sitting-balance support: Feet supported, Bilateral upper extremity supported Sitting balance-Leahy Scale: Poor Sitting balance - Comments: Min A to Mod A, occsaional BUE support against bed edge vs single UE support against bed rail Postural control: Left lateral lean                                 ADL either performed or assessed with clinical judgement   ADL                                         General ADL Comments: focused session on sitting balance/posture and RLE exercises with PT    Extremity/Trunk Assessment              Vision       Perception     Praxis     Communication Communication Communication: No apparent difficulties   Cognition Arousal: Alert Behavior During Therapy: Flat affect Cognition: No apparent impairments                               Following commands: Impaired Following commands impaired: Follows one  step commands with increased time      Cueing   Cueing Techniques: Verbal cues, Tactile cues  Exercises Amputee Exercises Hip ABduction/ADduction: Right, Seated, AROM (x3 reps each) Hip Flexion/Marching:  (x3 reps) Straight Leg Raises: Right, AROM, Seated (x3 reps) Other Exercises Other Exercises: dynamic reaching sitting EOB for trunk activation Other Exercises: R laterel elbow prop and push into midline x2    Shoulder Instructions       General Comments      Pertinent Vitals/ Pain       Pain Assessment Faces Pain Scale: Hurts even more Pain Location: R BKA revision with mobility Pain Descriptors / Indicators: Aching, Discomfort, Grimacing, Guarding, Sore Pain Intervention(s): Limited activity within patient's  tolerance, Monitored during session, Repositioned  Home Living                                          Prior Functioning/Environment              Frequency  Min 1X/week        Progress Toward Goals  OT Goals(current goals can now be found in the care plan section)  Progress towards OT goals: Progressing toward goals  Acute Rehab OT Goals Patient Stated Goal: to get better OT Goal Formulation: With patient/family Time For Goal Achievement: 05/21/23 Potential to Achieve Goals: Fair  Plan      Co-evaluation    PT/OT/SLP Co-Evaluation/Treatment: Yes Reason for Co-Treatment: To address functional/ADL transfers;Complexity of the patient's impairments (multi-system involvement);For patient/therapist safety   OT goals addressed during session: Strengthening/ROM      AM-PAC OT "6 Clicks" Daily Activity     Outcome Measure   Help from another person eating meals?: A Little Help from another person taking care of personal grooming?: A Lot Help from another person toileting, which includes using toliet, bedpan, or urinal?: A Lot Help from another person bathing (including washing, rinsing, drying)?: A Lot Help from another person to put on and taking off regular upper body clothing?: A Lot Help from another person to put on and taking off regular lower body clothing?: Total 6 Click Score: 12    End of Session    OT Visit Diagnosis: Unsteadiness on feet (R26.81);Muscle weakness (generalized) (M62.81)   Activity Tolerance Patient tolerated treatment well   Patient Left in bed;with call bell/phone within reach;with bed alarm set   Nurse Communication Mobility status;Need for lift equipment (hoyer lift for transfers)        Time: [phone] OT Time Calculation (min): 30 min  Charges: OT General Charges $OT Visit: 1 Visit OT Treatments $Therapeutic Activity: 8-22 mins  05/12/2023  AB, OTR/L  Acute Rehabilitation Services  Office:  [phone]   Tristan Schroeder 05/12/2023, 1:09 PM

## 2023-05-13 ENCOUNTER — Inpatient Hospital Stay (HOSPITAL_COMMUNITY)

## 2023-05-13 DIAGNOSIS — J90 Pleural effusion, not elsewhere classified: Secondary | ICD-10-CM | POA: Diagnosis not present

## 2023-05-13 LAB — BASIC METABOLIC PANEL
Anion gap: 15 (ref 5–15)
BUN: 65 mg/dL — ABNORMAL HIGH (ref 6–20)
CO2: 25 mmol/L (ref 22–32)
Calcium: 8.6 mg/dL — ABNORMAL LOW (ref 8.9–10.3)
Chloride: 98 mmol/L (ref 98–111)
Creatinine, Ser: 9 mg/dL — ABNORMAL HIGH (ref 0.61–1.24)
GFR, Estimated: 6 mL/min — ABNORMAL LOW (ref >60)
Glucose, Bld: 115 mg/dL — ABNORMAL HIGH (ref 70–99)
Potassium: 5.2 mmol/L — ABNORMAL HIGH (ref 3.5–5.1)
Sodium: 138 mmol/L (ref 135–145)

## 2023-05-13 LAB — GLUCOSE, CAPILLARY
Glucose-Capillary: 96 mg/dL (ref 70–99)
Glucose-Capillary: 126 mg/dL — ABNORMAL HIGH (ref 70–99)

## 2023-05-13 MED ORDER — LORAZEPAM 2 MG/ML IJ SOLN
1.0000 mg | Freq: Once | INTRAMUSCULAR | Status: AC
  Administered 2023-05-13: 1 mg via INTRAVENOUS
  Filled 2023-05-13: qty 1

## 2023-05-13 MED ORDER — SODIUM ZIRCONIUM CYCLOSILICATE 10 G PO PACK
10.0000 g | PACK | Freq: Once | ORAL | Status: AC
  Administered 2023-05-13: 10 g via ORAL
  Filled 2023-05-13: qty 1

## 2023-05-13 MED ORDER — LOPERAMIDE HCL 2 MG PO CAPS
2.0000 mg | ORAL_CAPSULE | Freq: Two times a day (BID) | ORAL | Status: DC
  Administered 2023-05-13 – 2023-05-15 (×4): 2 mg via ORAL
  Filled 2023-05-13 (×4): qty 1

## 2023-05-13 NOTE — Progress Notes (Signed)
 [facility] Progress Note   Subjective:    Seen in room. Terminated treatment early yesterday. No complaints today, not talkative.   Objective Vitals:   05/12/23 2209 05/13/23 0428 05/13/23 0500 05/13/23 0823  BP: (!) 152/70 (!) 162/81  (!) 153/60  Pulse: 69 75  70  Resp:  18  18  Temp:  98.6 F (37 C)  98.7 F (37.1 C)  TempSrc:      SpO2:  94%  95%  Weight:   71.1 kg   Height:       Physical Exam General: Alert male in NAD Heart: RRR, no murmur Lungs: Clear; Abdomen: Non-distended Extremities: No sig LE edema  Dialysis Access: AVF +bruit   Filed Weights   05/12/23 1419 05/12/23 1658 05/13/23 0500  Weight: 68.3 kg 68 kg 71.1 kg    Intake/Output Summary (Last 24 hours) at 05/13/2023 1134 Last data filed at 05/13/2023 0620 Gross per 24 hour  Intake 120 ml  Output 301 ml  Net -181 ml    Additional Objective Labs: Basic Metabolic Panel: Recent Labs  Lab 05/10/23 0839 05/11/23 0820 05/12/23 1418  NA 138 140 138  K 6.0* 5.6* 5.5*  CL 105 102 100  CO2 19* 23 24  GLUCOSE 70 77 80  BUN 83* 60* 79*  CREATININE 13.39* 9.93* 11.63*  CALCIUM 8.4* 8.4* 8.9  PHOS 10.3* 7.5* 8.1*   Liver Function Tests: Recent Labs  Lab 05/10/23 0839 05/11/23 0820 05/12/23 1418  ALBUMIN 1.9* 1.9* 1.9*   No results for input(s): "LIPASE", "AMYLASE" in the last 168 hours. CBC: Recent Labs  Lab 05/07/23 0332 05/09/23 0755 05/10/23 0839 05/11/23 0820 05/12/23 1418  WBC 7.5 9.5 8.3 9.6 12.9*  NEUTROABS  --   --   --  5.8 9.4*  HGB 7.8* 7.9* 7.2* 8.5* 7.4*  HCT 25.4* 26.4* 24.5* 27.8* 24.7*  MCV 72.0* 71.2* 72.1* 73.2* 74.0*  PLT 235 243 248 240 210   Blood Culture    Component Value Date/Time   SDES FLUID PLEURAL RIGHT 05/05/2023 1443   SDES FLUID PLEURAL RIGHT 05/05/2023 1443   SPECREQUEST BOTTLES DRAWN AEROBIC AND ANAEROBIC 05/05/2023 1443   SPECREQUEST NONE 05/05/2023 1443   CULT  05/05/2023 1443    NO GROWTH 5 DAYS Performed at [facility], [address], [place], [place] [address]    REPTSTATUS 05/10/2023 FINAL 05/05/2023 1443   REPTSTATUS 05/05/2023 FINAL 05/05/2023 1443    Cardiac Enzymes: No results for input(s): "CKTOTAL", "CKMB", "CKMBINDEX", "TROPONINI" in the last 168 hours. CBG: Recent Labs  Lab 05/11/23 1424 05/11/23 1436 05/11/23 1656 05/11/23 2036 05/13/23 0820  GLUCAP 80 79 127* 131* 96   Iron Studies: No results for input(s): "IRON", "TIBC", "TRANSFERRIN", "FERRITIN" in the last 72 hours. Lab Results  Component Value Date   INR 1.2 05/06/2023   INR 1.2 05/05/2023   INR 1.1 03/01/2022   Studies/Results: No results found.   Medications:   amiodarone  100 mg Oral BH-q7a   ascorbic acid  1,000 mg Oral Daily   aspirin EC  81 mg Oral Daily   atorvastatin  80 mg Oral QHS   carvedilol  12.5 mg Oral BID   Chlorhexidine Gluconate Cloth  6 each Topical Q0600   Chlorhexidine Gluconate Cloth  6 each Topical Q0600   cinacalcet  60 mg Oral Q T,Th,Sat-1800   darbepoetin (ARANESP) injection - DIALYSIS  100 mcg Subcutaneous Q Thu-1800   dorzolamide-timolol  1 drop Right Eye BID  Gerhardt's butt cream   Topical BID   heparin  5,000 Units Subcutaneous Q8H   hydrocortisone  1 Application Rectal BID   insulin aspart  0-6 Units Subcutaneous TID WC   levETIRAcetam  500 mg Oral Once per day on Sunday Monday Wednesday Friday   levETIRAcetam  750 mg Oral Once per day on Tuesday Thursday Saturday   lidocaine-prilocaine  1 Application Topical Once per day on Monday Wednesday Friday   multivitamin  1 tablet Oral QHS   nutrition supplement (JUVEN)  1 packet Oral BID BM   sodium chloride flush  3 mL Intravenous Q12H   sucroferric oxyhydroxide  500 mg Oral TID WC   zinc sulfate (50mg  elemental zinc)  220 mg Oral Daily    Dialysis Orders: [facility] TTS. 3 hours 15 minutes. EDW: 77.5 kg (however likely well under this as per outpatient unit). LUE AVF, 15-gauge needles. Flow rates: 400/800. 37  degrees. 2K/2.5 calcium. UF profile #2. Heparin: Loading 1000 units, 400 units/h. Meds: Mircera 100 mcg every 2 weeks (last dose 3/4)   Assessment/Plan: 1. R pleural effusion: S/p thoracentesis with IR on 3/13 2. Dehiscence R AKA - S/p revision 3/19 by [name].  3. Hyperkalemia - Not adhering to dialysis Rx, diet. Consider Lokelma  4. ESRD: HD TTS. Next HD Sat  5. HTN/volume: BP/volume ok  6. Anemia: S/p 2 units PRBCs total since admit (3/13 and 3/18). Arensep 100 on 3/20. Increase next dose. Transfuse prn.  7. Secondary hyperparathyroidism: Corr Ca 10.08 and phos is trending. Hold Calcitriol for now. Cont Sensipar. Velphoro.  8. Nutrition:  Alb low, continue protein supplements  9. Dispo - Plan for discharge to SNF one patient is medically stable  Tomasa Blase PA-C [employer] 05/13/2023,11:36 AM

## 2023-05-13 NOTE — Progress Notes (Signed)
 PROGRESS NOTE  [name].  DOB: 06-19-1970  PCP: [name], FNP ZOX:[id number]  DOA: 05/05/2023  LOS: 8 days  Hospital Day: 9  Brief narrative: [name]. is a 53 y.o. male, resident of [facility] with PMH significant for ESRD HD TTS, recent right BKA ([name]), DM2, CAD, CVA, atrial fibrillation not on anticoagulation and seizure disorder. 3/13, patient presented to the ED at [facility] with seizure-like activity which occurred during dialysis.   Workup in the ED showed of large right pleural effusion.  Additionally, thinning of skin of his right BKA amputation stump was noted with a small area of focal ulceration.  The decision was made to transfer him to [facility] where he could be seen by [name] for possible revision of his right BKA stump.   PCCM was consulted to consider a possible thoracentesis.  Subjective: Patient was seen and examined this morning. Not in distress. Pending SNF approval by insurance.  Assessment and plan: Large right pleural effusion  3/13, thoracentesis in IR yielded 1.5 L of hazy amber fluid - felt to likely represent a parapneumonic effusion due to chronic aspiration, versus possible trauma related with history of recent falls per patient -empiric Unasyn was initiated  Chest x-ray postthoracentesis continue to show effusion. CT chest was obtained which showed a very large persisting right effusion 3/14, pigtail chest tube placed by pulmonary with improved drainage 3/15, lytics infused per chest tube -pleural effusion essentially resolved with follow-up chest x-ray noting no significant persisting effusion 3/17, chest tube was removed at bedside Cytology of pleural fluid did not show evidence of malignant cells   Right AKA stump wound dehiscence Seen by [name]. 3/19, underwent right AKA revision.   Seizure disorder suspected breakthrough seizure Neurology suggested continuing Keppra 500 mg daily with additional 250 mg on  dialysis days after dialysis - clonazepam used at 0.5 mg twice daily for 3 days as a bridge while underlying infection being corrected    ESRD HD TTS Per nephrology.   Anemia of CKD Hemoglobin low mostly between 7 and 8.  Received 2 units of PRBC transfusion this hospitalization  Recent Labs    02/19/23 0259 02/21/23 0257 03/26/23 0526 03/29/23 1211 05/07/23 0332 05/09/23 0755 05/10/23 0839 05/11/23 0820 05/12/23 1418  HGB 8.0*   < > 7.8*   < > 7.8* 7.9* 7.2* 8.5* 7.4*  MCV 76.8*   < > 75.5*   < > 72.0* 71.2* 72.1* 73.2* 74.0*  VITAMINB12  --   --  501  --   --   --   --   --   --   FOLATE  --   --  6.1  --   --   --   --   --   --   FERRITIN  --   --  820*  --   --   --   --   --   --   TIBC 134*  --  141*  --   --   --   --   --   --   IRON 34*  --  43*  --   --   --   --   --   --   RETICCTPCT  --   --  1.1  --   --   --   --   --   --    < > = values in this interval not displayed.   Chronic loose  stools Remove rectal tube yesterday. Currently getting Imodium nightly only.  I increased it to twice daily today.   Chronic diastolic CHF CHF Recent echo with EF 55-60% Blood pressure and volume management per dialysis -no gross overload presently Currently on carvedilol 12.5 mg twice daily Midodrine on hold.  Blood pressure stable   CAD S/p  DES to LAD 2022 HLD Currently no anginal symptoms  Continue carvedilol, aspirin, statin    Chronic atrial fibrillation Rate controlled on amiodarone.   Not on anticoagulation -presumably due to anemia  Type 2 diabetes mellitus Hyperglycemia A1c 5 on 05/05/2023 PTA meds-none Currently on SSI/Accu-Cheks.  Blood sugar level low at 59 this afternoon.  Allowed to eat after procedure Recent Labs  Lab 05/11/23 1424 05/11/23 1436 05/11/23 1656 05/11/23 2036 05/13/23 0820  GLUCAP 80 79 127* 131* 96   Mobility: Needs PT  Goals of care   Code Status: Full Code     DVT prophylaxis:  SCD's Start: 05/11/23 1519 heparin  injection 5,000 Units Start: 05/05/23 2200 TED hose Start: 05/05/23 1023 SCDs Start: 05/05/23 1023   Antimicrobials: None currently Fluid: None Consultants: Nephrology, orthopedics Family Communication: None at bedside  Status: Inpatient Level of care:  Med-Surg   Patient is from: [facility] rehab center for short-term rehab Needs to continue in-hospital care: Medically stable. Anticipated d/c to: Hopefully back to rehab in 1 to 2 days. Pending Research scientist (life sciences).      Diet:  Diet Order             Diet renal with fluid restriction Fluid restriction: 1200 mL Fluid; Room service appropriate? Yes; Fluid consistency: Thin  Diet effective now                   Scheduled Meds:  amiodarone  100 mg Oral BH-q7a   ascorbic acid  1,000 mg Oral Daily   aspirin EC  81 mg Oral Daily   atorvastatin  80 mg Oral QHS   carvedilol  12.5 mg Oral BID   Chlorhexidine Gluconate Cloth  6 each Topical Q0600   Chlorhexidine Gluconate Cloth  6 each Topical Q0600   cinacalcet  60 mg Oral Q T,Th,Sat-1800   darbepoetin (ARANESP) injection - DIALYSIS  100 mcg Subcutaneous Q Thu-1800   dorzolamide-timolol  1 drop Right Eye BID   Gerhardt's butt cream   Topical BID   heparin  5,000 Units Subcutaneous Q8H   hydrocortisone  1 Application Rectal BID   insulin aspart  0-6 Units Subcutaneous TID WC   levETIRAcetam  500 mg Oral Once per day on Sunday Monday Wednesday Friday   levETIRAcetam  750 mg Oral Once per day on Tuesday Thursday Saturday   lidocaine-prilocaine  1 Application Topical Once per day on Monday Wednesday Friday   loperamide  2 mg Oral BID   multivitamin  1 tablet Oral QHS   nutrition supplement (JUVEN)  1 packet Oral BID BM   sodium chloride flush  3 mL Intravenous Q12H   sucroferric oxyhydroxide  500 mg Oral TID WC   zinc sulfate (50mg  elemental zinc)  220 mg Oral Daily    PRN meds: acetaminophen **OR** [DISCONTINUED] acetaminophen, bisacodyl, hydrALAZINE, HYDROmorphone  (DILAUDID) injection, ipratropium, levalbuterol, ondansetron **OR** ondansetron (ZOFRAN) IV, oxyCODONE, senna-docusate, sodium chloride flush, zolpidem   Infusions:     Antimicrobials: Anti-infectives (From admission, onward)    Start     Dose/Rate Route Frequency Ordered Stop   05/11/23 1405  vancomycin (VANCOCIN) powder  Status:  Discontinued  As needed 05/11/23 1405 05/11/23 1429   05/11/23 0600  ceFAZolin (ANCEF) IVPB 2g/100 mL premix        2 g 200 mL/hr over 30 Minutes Intravenous On call to O.R. 05/10/23 2131 05/11/23 1346   05/05/23 1800  Ampicillin-Sulbactam (UNASYN) 3 g in sodium chloride 0.9 % 100 mL IVPB        3 g 200 mL/hr over 30 Minutes Intravenous Every 24 hours 05/05/23 1643 05/10/23 0045       Objective: Vitals:   05/13/23 0428 05/13/23 0823  BP: (!) 162/81 (!) 153/60  Pulse: 75 70  Resp: 18 18  Temp: 98.6 F (37 C) 98.7 F (37.1 C)  SpO2: 94% 95%    Intake/Output Summary (Last 24 hours) at 05/13/2023 1512 Last data filed at 05/13/2023 7829 Gross per 24 hour  Intake 120 ml  Output 301 ml  Net -181 ml   Filed Weights   05/12/23 1419 05/12/23 1658 05/13/23 0500  Weight: 68.3 kg 68 kg 71.1 kg   Weight change: -0.7 kg Body mass index is 19.59 kg/m.   Physical Exam: General exam: Pleasant, middle-aged African-American male.  Looks older for his age Skin: No rashes, lesions or ulcers. HEENT: Atraumatic, normocephalic, no obvious bleeding Lungs: Clear to auscultation bilaterally,  CVS: S1, S2, no murmur,   GI/Abd: Soft, nontender, nondistended, bowel sound present,   CNS: Alert, awake, slow to respond.  Oriented x 3 Psychiatry: Mood appropriate,  Extremities: No pedal edema, no calf tenderness.  Right AKA stump  Data Review: I have personally reviewed the laboratory data and studies available.  F/u labs ordered Unresulted Labs (From admission, onward)    None      Total time spent in review of labs and imaging, patient evaluation,  formulation of plan, documentation and communication with family: 25 minutes  Signed, Lorin Glass, MD [facility] 05/13/2023

## 2023-05-13 NOTE — TOC Progression Note (Signed)
 Transition of Care (TOC) - Progression Note    Patient Details  Name: [name]. MRN: [id number] Date of Birth: 04/24/1970  Transition of Care [facility]) CM/SW Contact  Dellie Burns [place], [place] Phone Number: 05/13/2023, 12:52 PM  Clinical Narrative: Per [facility] [employer], auth for [facility] remains pending and has been sent to medical director for review. No additional clinicals requested at this time. SW will follow.  Dellie Burns, MSW, LCSW [phone] (coverage)        Expected Discharge Plan: Skilled Nursing Facility Barriers to Discharge: Continued Medical Work up  Expected Discharge Plan and Services In-house Referral: Clinical Social Work   Post Acute Care Choice: Skilled Nursing Facility Living arrangements for the past 2 months: Skilled Nursing Facility, Single Family Home                                       Social Determinants of Health (SDOH) Interventions SDOH Screenings   Food Insecurity: No Food Insecurity (05/05/2023)  Housing: High Risk (05/05/2023)  Transportation Needs: No Transportation Needs (05/05/2023)  Utilities: Not At Risk (05/05/2023)  Financial Resource Strain: High Risk (07/05/2017)  Physical Activity: Inactive (07/05/2017)  Social Connections: Socially Integrated (05/05/2023)  Stress: Stress Concern Present (07/05/2017)  Tobacco Use: Low Risk  (05/11/2023)  Health Literacy: Low Risk  (06/01/2020)   Received from [facility], [facility]    Readmission Risk Interventions    07/27/2021   11:49 AM 11/27/2020    3:06 PM  Readmission Risk Prevention Plan  Transportation Screening Complete Complete  PCP or Specialist Appt within 3-5 Days  Complete  HRI or Home Care Consult  Complete  Social Work Consult for Recovery Care Planning/Counseling  Complete  Palliative Care Screening  Not Applicable  Medication Review Oceanographer) Complete Complete  SW Recovery Care/Counseling Consult Complete   Palliative Care  Screening Not Applicable   Skilled Nursing Facility Not Applicable

## 2023-05-13 NOTE — TOC Progression Note (Signed)
 Transition of Care (TOC) - Progression Note    Patient Details  Name: [name]. MRN: [id number] Date of Birth: [birth year]  Transition of Care [facility]) CM/SW Contact  Tom-Johnson, Hershal Coria, RN Phone Number: 05/13/2023, 1:54 PM  Clinical Narrative:     CM spoke with patient's wife Martie Lee about discharge disposition. Martie Lee states patient told her insurance denied and will be going home. CM informed Martie Lee that insurance Berkley Harvey is still pending for [employer] per LCSW's note. Martie Lee states she would like a hosp bed, hoyer lift, sliding board and wheelchair if insurance Berkley Harvey is denied and patient discharging home. MD and LCSW notified.   TOC will continue to follow.         Expected Discharge Plan: Skilled Nursing Facility Barriers to Discharge: Continued Medical Work up  Expected Discharge Plan and Services In-house Referral: Clinical Social Work   Post Acute Care Choice: Skilled Nursing Facility Living arrangements for the past 2 months: Skilled Nursing Facility, Single Family Home                                       Social Determinants of Health (SDOH) Interventions SDOH Screenings   Food Insecurity: No Food Insecurity (05/05/2023)  Housing: High Risk (05/05/2023)  Transportation Needs: No Transportation Needs (05/05/2023)  Utilities: Not At Risk (05/05/2023)  Financial Resource Strain: High Risk (07/05/2017)  Physical Activity: Inactive (07/05/2017)  Social Connections: Socially Integrated (05/05/2023)  Stress: Stress Concern Present (07/05/2017)  Tobacco Use: Low Risk  (05/11/2023)  Health Literacy: Low Risk  (06/01/2020)   [facility], [facility]    Readmission Risk Interventions    07/27/2021   11:49 AM 11/27/2020    3:06 PM  Readmission Risk Prevention Plan  Transportation Screening Complete Complete  PCP or Specialist Appt within 3-5 Days  Complete  HRI or Home Care Consult  Complete  Social Work Consult for Recovery  Care Planning/Counseling  Complete  Palliative Care Screening  Not Applicable  Medication Review Oceanographer) Complete Complete  SW Recovery Care/Counseling Consult Complete   Palliative Care Screening Not Applicable   Skilled Nursing Facility Not Applicable

## 2023-05-13 NOTE — Progress Notes (Signed)
 was told in report that this patient was A&O x4 this my first time with him and he told me he fell out of the refrigerator and he doesn't know where he is he will follow commands pupil is 2 reactive he will not open his left eye he told me he feels strange he was admitted with seizures and pleural effusions lungs good diminished

## 2023-05-13 NOTE — Plan of Care (Signed)
  Problem: Clinical Measurements: Goal: Ability to maintain clinical measurements within normal limits will improve Outcome: Progressing Goal: Will remain free from infection Outcome: Progressing Goal: Diagnostic test results will improve Outcome: Progressing Goal: Respiratory complications will improve Outcome: Progressing Goal: Cardiovascular complication will be avoided Outcome: Progressing   Problem: Activity: Goal: Risk for activity intolerance will decrease Outcome: Progressing   Problem: Nutrition: Goal: Adequate nutrition will be maintained Outcome: Progressing   Problem: Elimination: Goal: Will not experience complications related to bowel motility Outcome: Progressing Goal: Will not experience complications related to urinary retention Outcome: Progressing   Problem: Pain Managment: Goal: General experience of comfort will improve and/or be controlled Outcome: Progressing   Problem: Safety: Goal: Ability to remain free from injury will improve Outcome: Progressing   Problem: Skin Integrity: Goal: Risk for impaired skin integrity will decrease Outcome: Progressing   Problem: Nutritional: Goal: Maintenance of adequate nutrition will improve Outcome: Progressing   Problem: Skin Integrity: Goal: Risk for impaired skin integrity will decrease Outcome: Progressing   Problem: Tissue Perfusion: Goal: Adequacy of tissue perfusion will improve Outcome: Progressing   Problem: Clinical Measurements: Goal: Postoperative complications will be avoided or minimized Outcome: Progressing   Problem: Pain Management: Goal: Pain level will decrease with appropriate interventions Outcome: Progressing

## 2023-05-14 ENCOUNTER — Inpatient Hospital Stay (HOSPITAL_COMMUNITY)

## 2023-05-14 DIAGNOSIS — J90 Pleural effusion, not elsewhere classified: Secondary | ICD-10-CM | POA: Diagnosis not present

## 2023-05-14 LAB — CBC
HCT: 22.6 % — ABNORMAL LOW (ref 39.0–52.0)
Hemoglobin: 6.9 g/dL — CL (ref 13.0–17.0)
MCH: 22.3 pg — ABNORMAL LOW (ref 26.0–34.0)
MCHC: 30.5 g/dL (ref 30.0–36.0)
MCV: 72.9 fL — ABNORMAL LOW (ref 80.0–100.0)
Platelets: 185 10*3/uL (ref 150–400)
RBC: 3.1 MIL/uL — ABNORMAL LOW (ref 4.22–5.81)
RDW: 22 % — ABNORMAL HIGH (ref 11.5–15.5)
WBC: 11.3 10*3/uL — ABNORMAL HIGH (ref 4.0–10.5)
nRBC: 0 % (ref 0.0–0.2)

## 2023-05-14 LAB — PREPARE RBC (CROSSMATCH)

## 2023-05-14 MED ORDER — SODIUM CHLORIDE 0.9% IV SOLUTION: Freq: Once | INTRAVENOUS | Status: DC

## 2023-05-14 NOTE — Progress Notes (Signed)
 Pt. C/o severe pain to right stump, moderate amount of bleeding noted on dressing, pt. Complaining that ace wrap is on to tight, dressing removed, large blood clot (approx 10x13 noted, see pictures), sutures to right stump C/D/I, scant amount of bloody drainage noted to incision, no clinical S/S of infection noted, right femoral pulse palpable, [name] made aware, incision line cleansed with NS and dry dressing applied, no need for ace wrap per MD  Margarita Grizzle

## 2023-05-14 NOTE — Progress Notes (Signed)
 Patient ID: [name]., male   DOB: 12-13-1970, 53 y.o.   MRN: [id number] Patient is postoperative day 3 right above-knee amputation revision.  Patient states he still has some pain.  Anticipate discharge to skilled nursing.

## 2023-05-14 NOTE — Plan of Care (Signed)

## 2023-05-14 NOTE — Progress Notes (Signed)
 [facility] Progress Note   Subjective:    Seen in room. No new complaints. Hgb 6.9, blood dresing on AKA. Dialysis today with transfusion   Objective Vitals:   05/13/23 1514 05/13/23 2019 05/14/23 0333 05/14/23 0852  BP: (!) 146/67 (!) 143/58 (!) 164/62 (!) 115/57  Pulse:  66 63 67  Resp: 18 18 20 18   Temp: 98.7 F (37.1 C) 99.8 F (37.7 C) 98.5 F (36.9 C) 98 F (36.7 C)  TempSrc: Oral Oral  Oral  SpO2: 100% 95% 96% 98%  Weight:      Height:       Physical Exam General: Alert male in NAD Heart: RRR, no murmur Lungs: Clear; Abdomen: Non-distended Extremities: No LE edema: R AKA  Dialysis Access: AVF +bruit   Filed Weights   05/12/23 1419 05/12/23 1658 05/13/23 0500  Weight: 68.3 kg 68 kg 71.1 kg    Intake/Output Summary (Last 24 hours) at 05/14/2023 1205 Last data filed at 05/14/2023 0800 Gross per 24 hour  Intake --  Output 4 ml  Net -4 ml    Additional Objective Labs: Basic Metabolic Panel: Recent Labs  Lab 05/10/23 0839 05/11/23 0820 05/12/23 1418 05/13/23 1105  NA 138 140 138 138  K 6.0* 5.6* 5.5* 5.2*  CL 105 102 100 98  CO2 19* 23 24 25   GLUCOSE 70 77 80 115*  BUN 83* 60* 79* 65*  CREATININE 13.39* 9.93* 11.63* 9.00*  CALCIUM 8.4* 8.4* 8.9 8.6*  PHOS 10.3* 7.5* 8.1*  --    Liver Function Tests: Recent Labs  Lab 05/10/23 0839 05/11/23 0820 05/12/23 1418  ALBUMIN 1.9* 1.9* 1.9*   No results for input(s): "LIPASE", "AMYLASE" in the last 168 hours. CBC: Recent Labs  Lab 05/09/23 0755 05/10/23 0839 05/11/23 0820 05/12/23 1418 05/14/23 0831  WBC 9.5 8.3 9.6 12.9* 11.3*  NEUTROABS  --   --  5.8 9.4*  --   HGB 7.9* 7.2* 8.5* 7.4* 6.9*  HCT 26.4* 24.5* 27.8* 24.7* 22.6*  MCV 71.2* 72.1* 73.2* 74.0* 72.9*  PLT 243 248 240 210 185   Blood Culture    Component Value Date/Time   SDES FLUID PLEURAL RIGHT 05/05/2023 1443   SDES FLUID PLEURAL RIGHT 05/05/2023 1443   SPECREQUEST BOTTLES DRAWN AEROBIC AND ANAEROBIC  05/05/2023 1443   SPECREQUEST NONE 05/05/2023 1443   CULT  05/05/2023 1443    NO GROWTH 5 DAYS Performed at [facility], [address], [place], [place] [address]    REPTSTATUS 05/10/2023 FINAL 05/05/2023 1443   REPTSTATUS 05/05/2023 FINAL 05/05/2023 1443    Cardiac Enzymes: No results for input(s): "CKTOTAL", "CKMB", "CKMBINDEX", "TROPONINI" in the last 168 hours. CBG: Recent Labs  Lab 05/11/23 1436 05/11/23 1656 05/11/23 2036 05/13/23 0820 05/13/23 2110  GLUCAP 79 127* 131* 96 126*   Iron Studies: No results for input(s): "IRON", "TIBC", "TRANSFERRIN", "FERRITIN" in the last 72 hours. Lab Results  Component Value Date   INR 1.2 05/06/2023   INR 1.2 05/05/2023   INR 1.1 03/01/2022   Studies/Results: CT HEAD WO CONTRAST ( ) Result Date: 05/14/2023 CLINICAL DATA:  Mental status change of unknown cause.  Seizures. EXAM: CT HEAD WITHOUT CONTRAST TECHNIQUE: Contiguous axial images were obtained from the base of the skull through the vertex without intravenous contrast. RADIATION DOSE REDUCTION: This exam was performed according to the departmental dose-optimization program which includes automated exposure control, adjustment of the mA and/or kV according to patient size and/or use of iterative reconstruction technique. COMPARISON:  MRI brain 03/17/2023.  CT head 03/17/2023 FINDINGS: Brain: Diffuse cerebral atrophy. Ventricular dilatation is likely due to central atrophy. Low-attenuation changes in the deep white matter consistent with small vessel ischemia. Increasing low-attenuation changes extending to the cortical surface in the vertex bilaterally. This is similar to previous studies likely representing old infarcts or ischemia. No mass-effect or midline shift. No abnormal extra-axial fluid collections. Gray-white matter junctions are distinct. Basal cisterns are not effaced. No acute intracranial hemorrhage. Vascular: No hyperdense vessel or unexpected calcification. Skull:  Normal. Negative for fracture or focal lesion. Sinuses/Orbits: Mild mucosal thickening in the paranasal sinuses. Partial opacification of mastoid air cells bilaterally. Left ocular prosthesis. Other: None. IMPRESSION: 1. No acute intracranial abnormalities. Chronic atrophy and small vessel ischemic changes. Old infarcts bilaterally. 2. Bilateral mastoid effusions. Electronically Signed   By: Burman Nieves M.D.   On: 05/14/2023 00:38     Medications:   sodium chloride   Intravenous Once   amiodarone  100 mg Oral BH-q7a   ascorbic acid  1,000 mg Oral Daily   aspirin EC  81 mg Oral Daily   atorvastatin  80 mg Oral QHS   carvedilol  12.5 mg Oral BID   Chlorhexidine Gluconate Cloth  6 each Topical Q0600   cinacalcet  60 mg Oral Q T,Th,Sat-1800   darbepoetin (ARANESP) injection - DIALYSIS  100 mcg Subcutaneous Q Thu-1800   dorzolamide-timolol  1 drop Right Eye BID   Gerhardt's butt cream   Topical BID   heparin  5,000 Units Subcutaneous Q8H   hydrocortisone  1 Application Rectal BID   insulin aspart  0-6 Units Subcutaneous TID WC   levETIRAcetam  500 mg Oral Once per day on Sunday Monday Wednesday Friday   levETIRAcetam  750 mg Oral Once per day on Tuesday Thursday Saturday   lidocaine-prilocaine  1 Application Topical Once per day on Monday Wednesday Friday   loperamide  2 mg Oral BID   multivitamin  1 tablet Oral QHS   nutrition supplement (JUVEN)  1 packet Oral BID BM   sodium chloride flush  3 mL Intravenous Q12H   sucroferric oxyhydroxide  500 mg Oral TID WC   zinc sulfate (50mg  elemental zinc)  220 mg Oral Daily    Dialysis Orders: [facility] TTS. 3 hours 15 minutes. EDW: 77.5 kg (however likely well under this as per outpatient unit). LUE AVF, 15-gauge needles. Flow rates: 400/800. 37 degrees. 2K/2.5 calcium. UF profile #2. Heparin: Loading 1000 units, 400 units/h. Meds: Mircera 100 mcg every 2 weeks (last dose 3/4)   Assessment/Plan: 1. R pleural effusion: S/p  thoracentesis with IR on 3/13 2. Dehiscence R AKA - S/p revision 3/19 by [name].  3. Hyperkalemia - Not adhering to dialysis Rx, diet. Consider Lokelma  4. ESRD: HD TTS. Next HD Sat  5. HTN/volume: BP/volume ok  6. Anemia: S/p 2 units PRBCs total since admit (3/13 and 3/18). Getting another unit today. Arensep 100 on 3/20. Increase next dose. Transfuse prn.  7. Secondary hyperparathyroidism: Corr Ca 10.08 and phos is trending. Hold Calcitriol for now. Cont Sensipar. Velphoro.  8. Nutrition:  Alb low, continue protein supplements  9. Dispo - Plan for discharge to SNF one patient is medically stable  [name] Blase PA-C [facility] 05/14/2023,12:05 PM

## 2023-05-14 NOTE — Progress Notes (Signed)
 PROGRESS NOTE  Shelley Glori Luis.  DOB: 03/03/1970  PCP: [name], FNP QMV:[id number]  DOA: 05/05/2023  LOS: 9 days  Hospital Day: 10  Brief narrative: [name]. is a 53 y.o. male, resident of [facility] with PMH significant for ESRD HD TTS, recent right BKA ([name]), DM2, CAD, CVA, atrial fibrillation not on anticoagulation and seizure disorder. 3/13, patient presented to the ED at [facility] with seizure-like activity which occurred during dialysis.   Workup in the ED showed of large right pleural effusion.  Additionally, thinning of skin of his right BKA amputation stump was noted with a small area of focal ulceration.  The decision was made to transfer him to [facility] where he could be seen by [name] for possible revision of his right BKA stump.   PCCM was consulted to consider a possible thoracentesis.  Subjective: Patient was seen and examined this morning. Lying on bed.  Not in distress taking his breakfast. Labs from this morning with hemoglobin down to 6.9.  Assessment and plan: Large right pleural effusion  3/13, thoracentesis in IR yielded 1.5 L of hazy amber fluid - felt to likely represent a parapneumonic effusion due to chronic aspiration, versus possible trauma related with history of recent falls per patient -empiric Unasyn was initiated  Chest x-ray postthoracentesis continue to show effusion. CT chest was obtained which showed a very large persisting right effusion 3/14, pigtail chest tube placed by pulmonary with improved drainage 3/15, lytics infused per chest tube -pleural effusion essentially resolved with follow-up chest x-ray noting no significant persisting effusion 3/17, chest tube was removed at bedside Cytology of pleural fluid did not show evidence of malignant cells   Right AKA stump wound dehiscence Seen by [name]. 3/19, underwent right AKA revision.   Seizure disorder suspected breakthrough seizure Neurology suggested  continuing Keppra 500 mg daily with additional 250 mg on dialysis days after dialysis - clonazepam used at 0.5 mg twice daily for 3 days as a bridge while underlying infection being corrected    ESRD HD TTS Per nephrology.   Acute on chronic anemia  anemia of CKD Hemoglobin low mostly between 7 and 8.  Received 2 units of PRBC transfusion this hospitalization.  No active bleeding but hemoglobin low at 6.9 again today.  1 unit of transfusion given with dialysis.. Recent Labs    02/19/23 0259 02/21/23 0257 03/26/23 0526 03/29/23 1211 05/09/23 0755 05/10/23 0839 05/11/23 0820 05/12/23 1418 05/14/23 0831  HGB 8.0*   < > 7.8*   < > 7.9* 7.2* 8.5* 7.4* 6.9*  MCV 76.8*   < > 75.5*   < > 71.2* 72.1* 73.2* 74.0* 72.9*  VITAMINB12  --   --  501  --   --   --   --   --   --   FOLATE  --   --  6.1  --   --   --   --   --   --   FERRITIN  --   --  820*  --   --   --   --   --   --   TIBC 134*  --  141*  --   --   --   --   --   --   IRON 34*  --  43*  --   --   --   --   --   --   RETICCTPCT  --   --  1.1  --   --   --   --   --   --    < > = values in this interval not displayed.   Chronic loose stools Rectal tube removed on 3/20. Currently on Imodium twice daily.  Continue to monitor   Chronic diastolic CHF CHF Recent echo with EF 55-60% Blood pressure and volume management per dialysis -no gross overload presently Currently on carvedilol 12.5 mg twice daily Midodrine on hold.  Blood pressure stable   CAD S/p  DES to LAD 2022 HLD Currently no anginal symptoms  Continue carvedilol, aspirin, statin    Chronic atrial fibrillation Rate controlled on amiodarone.   Not on anticoagulation -presumably due to anemia  Type 2 diabetes mellitus Hyperglycemia A1c 5 on 05/05/2023 PTA meds-none Currently on SSI/Accu-Cheks.  Blood sugar level low at 59 this afternoon.  Allowed to eat after procedure Recent Labs  Lab 05/11/23 1436 05/11/23 1656 05/11/23 2036 05/13/23 0820 05/13/23 2110   GLUCAP 79 127* 131* 96 126*   Mobility: Needs PT  Goals of care   Code Status: Full Code     DVT prophylaxis:  SCD's Start: 05/11/23 1519 heparin injection 5,000 Units Start: 05/05/23 2200 TED hose Start: 05/05/23 1023 SCDs Start: 05/05/23 1023   Antimicrobials: None currently Fluid: None Consultants: Nephrology, orthopedics Family Communication: None at bedside  Status: Inpatient Level of care:  Med-Surg   Patient is from: [facility] rehab center for short-term rehab Needs to continue in-hospital care: Medically stable. Anticipated d/c to: Hopefully back to rehab in 1 to 2 days. Pending Research scientist (life sciences).      Diet:  Diet Order             Diet renal with fluid restriction Fluid restriction: 1200 mL Fluid; Room service appropriate? Yes; Fluid consistency: Thin  Diet effective now                   Scheduled Meds:  sodium chloride   Intravenous Once   amiodarone  100 mg Oral BH-q7a   ascorbic acid  1,000 mg Oral Daily   aspirin EC  81 mg Oral Daily   atorvastatin  80 mg Oral QHS   carvedilol  12.5 mg Oral BID   Chlorhexidine Gluconate Cloth  6 each Topical Q0600   cinacalcet  60 mg Oral Q T,Th,Sat-1800   darbepoetin (ARANESP) injection - DIALYSIS  100 mcg Subcutaneous Q Thu-1800   dorzolamide-timolol  1 drop Right Eye BID   Gerhardt's butt cream   Topical BID   heparin  5,000 Units Subcutaneous Q8H   hydrocortisone  1 Application Rectal BID   insulin aspart  0-6 Units Subcutaneous TID WC   levETIRAcetam  500 mg Oral Once per day on Sunday Monday Wednesday Friday   levETIRAcetam  750 mg Oral Once per day on Tuesday Thursday Saturday   lidocaine-prilocaine  1 Application Topical Once per day on Monday Wednesday Friday   loperamide  2 mg Oral BID   multivitamin  1 tablet Oral QHS   nutrition supplement (JUVEN)  1 packet Oral BID BM   sodium chloride flush  3 mL Intravenous Q12H   sucroferric oxyhydroxide  500 mg Oral TID WC   zinc sulfate (50mg   elemental zinc)  220 mg Oral Daily    PRN meds: acetaminophen **OR** [DISCONTINUED] acetaminophen, bisacodyl, hydrALAZINE, HYDROmorphone (DILAUDID) injection, ipratropium, levalbuterol, ondansetron **OR** ondansetron (ZOFRAN) IV, oxyCODONE, senna-docusate, sodium chloride flush, zolpidem   Infusions:     Antimicrobials: Anti-infectives (From  admission, onward)    Start     Dose/Rate Route Frequency Ordered Stop   05/11/23 1405  vancomycin (VANCOCIN) powder  Status:  Discontinued          As needed 05/11/23 1405 05/11/23 1429   05/11/23 0600  ceFAZolin (ANCEF) IVPB 2g/100 mL premix        2 g 200 mL/hr over 30 Minutes Intravenous On call to O.R. 05/10/23 2131 05/11/23 1346   05/05/23 1800  Ampicillin-Sulbactam (UNASYN) 3 g in sodium chloride 0.9 % 100 mL IVPB        3 g 200 mL/hr over 30 Minutes Intravenous Every 24 hours 05/05/23 1643 05/10/23 0045       Objective: Vitals:   05/14/23 0333 05/14/23 0852  BP: (!) 164/62 (!) 115/57  Pulse: 63 67  Resp: 20 18  Temp: 98.5 F (36.9 C) 98 F (36.7 C)  SpO2: 96% 98%    Intake/Output Summary (Last 24 hours) at 05/14/2023 1431 Last data filed at 05/14/2023 0800 Gross per 24 hour  Intake --  Output 4 ml  Net -4 ml   Filed Weights   05/12/23 1419 05/12/23 1658 05/13/23 0500  Weight: 68.3 kg 68 kg 71.1 kg   Weight change:  Body mass index is 19.59 kg/m.   Physical Exam: General exam: Pleasant, middle-aged African-American male.  Looks older for his age Skin: No rashes, lesions or ulcers. HEENT: Atraumatic, normocephalic, no obvious bleeding Lungs: Clear to auscultation bilaterally,  CVS: S1, S2, no murmur,   GI/Abd: Soft, nontender, nondistended, bowel sound present,   CNS: Alert, awake, slow to respond.  Oriented x 3 Psychiatry: Mood appropriate,  Extremities: No pedal edema, no calf tenderness.  Right AKA stump  Data Review: I have personally reviewed the laboratory data and studies available.  F/u labs  ordered Unresulted Labs (From admission, onward)    None      Total time spent in review of labs and imaging, patient evaluation, formulation of plan, documentation and communication with family: 45 minutes  Signed, Lorin Glass, MD [facility] 05/14/2023

## 2023-05-14 NOTE — TOC Progression Note (Signed)
 Transition of Care (TOC) - Progression Note    Patient Details  Name: [name]. MRN: [id number] Date of Birth: April 02, 1970  Transition of Care [facility]) CM/SW Contact  Dellie Burns [place], [place] Phone Number: 05/14/2023, 9:44 AM  Clinical Narrative: Per [facility], pt's auth request for [facility] remains pending at this time and is in queue for medical director review. SW will provide updates as available.   Dellie Burns, MSW, LCSW [phone] (coverage)         Expected Discharge Plan: Skilled Nursing Facility Barriers to Discharge: Continued Medical Work up  Expected Discharge Plan and Services In-house Referral: Clinical Social Work   Post Acute Care Choice: Skilled Nursing Facility Living arrangements for the past 2 months: Skilled Nursing Facility, Single Family Home                                       Social Determinants of Health (SDOH) Interventions SDOH Screenings   Food Insecurity: No Food Insecurity (05/05/2023)  Housing: High Risk (05/05/2023)  Transportation Needs: No Transportation Needs (05/05/2023)  Utilities: Not At Risk (05/05/2023)  Financial Resource Strain: High Risk (07/05/2017)  Physical Activity: Inactive (07/05/2017)  Social Connections: Socially Integrated (05/05/2023)  Stress: Stress Concern Present (07/05/2017)  Tobacco Use: Low Risk  (05/11/2023)  Health Literacy: Low Risk  (06/01/2020)   Received from [facility], [facility]    Readmission Risk Interventions    07/27/2021   11:49 AM 11/27/2020    3:06 PM  Readmission Risk Prevention Plan  Transportation Screening Complete Complete  PCP or Specialist Appt within 3-5 Days  Complete  HRI or Home Care Consult  Complete  Social Work Consult for Recovery Care Planning/Counseling  Complete  Palliative Care Screening  Not Applicable  Medication Review Oceanographer) Complete Complete  SW Recovery Care/Counseling Consult Complete   Palliative Care  Screening Not Applicable   Skilled Nursing Facility Not Applicable

## 2023-05-14 NOTE — Progress Notes (Addendum)
 [name] night cross cover note:   I was notified by RN that patient appeared more confused this evening relative to report from day shift.   Per chart review, patient was originally hospitalized with seizure incurred during hemodialysis session, with hospital course also notable for left pleural effusion associated with suspected left parapneumonic effusion status post chest tube and course of Unasyn.  Neurology has consulted during this hospitalization and have made adjustments to the patient's Keppra to account for his hemodialysis schedule.  This evening, the patient is afebrile, although temperature is noted to be up slightly at 99.8, with heart rates in the 60s, blood pressure 143/58, respiratory rate 18, and oxygen saturation in the mid to high 90s on room air.  This evening, no report of any associated tonic-clonic activity nor any tongue biting or loss of bowel/bladder function.  CBG performed this evening was found to be 126.   He is scheduled to undergo hemodialysis in the AM (3/22) with associated labs anticipated at that time.   In terms of potential pharmacologic possibilities contributing to his altered mental status differential would include recent adjustments to his Keppra, as above, while also noting that he received a dose of IV Dilaudid this afternoon.  In setting of altered mental status that is reported to be new this evening relative to dayshift, I ordered CT head with pretreatment via Ativan 1 mg iv x 1 given his reported anxiety associated with imaging. CT head showed no evidence of acute intracranial process. Clinically, breakthrough seizure appears less likely, but will continue with existing plan regarding his recent h/o seizures while further monitoring for e/o breakthrough process. He is afebrile and non-septic appearing. Will continue to monitor and follow for updated AM lab results anticipated at time of HD in the AM.      Newton Pigg, DO Hospitalist

## 2023-05-15 DIAGNOSIS — J90 Pleural effusion, not elsewhere classified: Secondary | ICD-10-CM | POA: Diagnosis not present

## 2023-05-15 LAB — CBC WITH DIFFERENTIAL/PLATELET
Abs Immature Granulocytes: 0.06 10*3/uL (ref 0.00–0.07)
Basophils Absolute: 0.1 10*3/uL (ref 0.0–0.1)
Basophils Relative: 1 %
Eosinophils Absolute: 1 10*3/uL — ABNORMAL HIGH (ref 0.0–0.5)
Eosinophils Relative: 9 %
HCT: 23.1 % — ABNORMAL LOW (ref 39.0–52.0)
Hemoglobin: 7.1 g/dL — ABNORMAL LOW (ref 13.0–17.0)
Immature Granulocytes: 1 %
Lymphocytes Relative: 14 %
Lymphs Abs: 1.5 10*3/uL (ref 0.7–4.0)
MCH: 22.6 pg — ABNORMAL LOW (ref 26.0–34.0)
MCHC: 30.7 g/dL (ref 30.0–36.0)
MCV: 73.6 fL — ABNORMAL LOW (ref 80.0–100.0)
Monocytes Absolute: 1.1 10*3/uL — ABNORMAL HIGH (ref 0.1–1.0)
Monocytes Relative: 10 %
Neutro Abs: 7.3 10*3/uL (ref 1.7–7.7)
Neutrophils Relative %: 65 %
Platelets: 204 10*3/uL (ref 150–400)
RBC: 3.14 MIL/uL — ABNORMAL LOW (ref 4.22–5.81)
RDW: 22.1 % — ABNORMAL HIGH (ref 11.5–15.5)
WBC: 11.1 10*3/uL — ABNORMAL HIGH (ref 4.0–10.5)
nRBC: 0 % (ref 0.0–0.2)

## 2023-05-15 LAB — BPAM RBC
Blood Product Expiration Date: 202504072359
ISSUE DATE / TIME: 202503221517
Unit Type and Rh: 6200

## 2023-05-15 LAB — BASIC METABOLIC PANEL
Anion gap: 15 (ref 5–15)
BUN: 89 mg/dL — ABNORMAL HIGH (ref 6–20)
CO2: 24 mmol/L (ref 22–32)
Calcium: 8.5 mg/dL — ABNORMAL LOW (ref 8.9–10.3)
Chloride: 98 mmol/L (ref 98–111)
Creatinine, Ser: 11.51 mg/dL — ABNORMAL HIGH (ref 0.61–1.24)
GFR, Estimated: 5 mL/min — ABNORMAL LOW (ref >60)
Glucose, Bld: 109 mg/dL — ABNORMAL HIGH (ref 70–99)
Potassium: 5.4 mmol/L — ABNORMAL HIGH (ref 3.5–5.1)
Sodium: 137 mmol/L (ref 135–145)

## 2023-05-15 LAB — TYPE AND SCREEN
ABO/RH(D): A POS
Antibody Screen: NEGATIVE
Unit division: 0

## 2023-05-15 MED ORDER — LOPERAMIDE HCL 2 MG PO CAPS
2.0000 mg | ORAL_CAPSULE | Freq: Every day | ORAL | Status: DC
  Administered 2023-05-16: 2 mg via ORAL
  Filled 2023-05-15: qty 1

## 2023-05-15 NOTE — Progress Notes (Signed)
 [facility] Progress Note   Subjective:    Transfused yesterday after bleeding from stump wound. Better today.  Did not get dialysis yesterday d/t high inpatient dialysis census. For roll over HD today   Objective Vitals:   05/14/23 1541 05/14/23 1800 05/14/23 2043 05/15/23 0432  BP: (!) 149/70 (!) 121/58 (!) 127/53 136/62  Pulse: 64 68 63 64  Resp: 18 17 17 17   Temp: 98.2 F (36.8 C) 98 F (36.7 C) 98.2 F (36.8 C) 98.9 F (37.2 C)  TempSrc: Oral Oral Oral Oral  SpO2: 97% 96% 97% 96%  Weight:    69.4 kg  Height:       Physical Exam General: Alert male in NAD Heart: RRR, no murmur Lungs: Clear; Abdomen: Non-distended Extremities: No LE edema: R AKA  Dialysis Access: AVF +bruit   Filed Weights   05/12/23 1658 05/13/23 0500 05/15/23 0432  Weight: 68 kg 71.1 kg 69.4 kg    Intake/Output Summary (Last 24 hours) at 05/15/2023 0910 Last data filed at 05/15/2023 0600 Gross per 24 hour  Intake 948 ml  Output 0 ml  Net 948 ml    Additional Objective Labs: Basic Metabolic Panel: Recent Labs  Lab 05/10/23 0839 05/11/23 0820 05/12/23 1418 05/13/23 1105 05/15/23 0613  NA 138 140 138 138 137  K 6.0* 5.6* 5.5* 5.2* 5.4*  CL 105 102 100 98 98  CO2 19* 23 24 25 24   GLUCOSE 70 77 80 115* 109*  BUN 83* 60* 79* 65* 89*  CREATININE 13.39* 9.93* 11.63* 9.00* 11.51*  CALCIUM 8.4* 8.4* 8.9 8.6* 8.5*  PHOS 10.3* 7.5* 8.1*  --   --    Liver Function Tests: Recent Labs  Lab 05/10/23 0839 05/11/23 0820 05/12/23 1418  ALBUMIN 1.9* 1.9* 1.9*   No results for input(s): "LIPASE", "AMYLASE" in the last 168 hours. CBC: Recent Labs  Lab 05/10/23 0839 05/11/23 0820 05/12/23 1418 05/14/23 0831 05/15/23 0613  WBC 8.3 9.6 12.9* 11.3* 11.1*  NEUTROABS  --  5.8 9.4*  --  7.3  HGB 7.2* 8.5* 7.4* 6.9* 7.1*  HCT 24.5* 27.8* 24.7* 22.6* 23.1*  MCV 72.1* 73.2* 74.0* 72.9* 73.6*  PLT 248 240 210 185 204   Blood Culture    Component Value Date/Time   SDES  FLUID PLEURAL RIGHT 05/05/2023 1443   SDES FLUID PLEURAL RIGHT 05/05/2023 1443   SPECREQUEST BOTTLES DRAWN AEROBIC AND ANAEROBIC 05/05/2023 1443   SPECREQUEST NONE 05/05/2023 1443   CULT  05/05/2023 1443    NO GROWTH 5 DAYS Performed at [facility], [address], [place], [place] [address]    REPTSTATUS 05/10/2023 FINAL 05/05/2023 1443   REPTSTATUS 05/05/2023 FINAL 05/05/2023 1443    Cardiac Enzymes: No results for input(s): "CKTOTAL", "CKMB", "CKMBINDEX", "TROPONINI" in the last 168 hours. CBG: Recent Labs  Lab 05/11/23 1436 05/11/23 1656 05/11/23 2036 05/13/23 0820 05/13/23 2110  GLUCAP 79 127* 131* 96 126*   Iron Studies: No results for input(s): "IRON", "TIBC", "TRANSFERRIN", "FERRITIN" in the last 72 hours. Lab Results  Component Value Date   INR 1.2 05/06/2023   INR 1.2 05/05/2023   INR 1.1 03/01/2022   Studies/Results: CT HEAD WO CONTRAST ( ) Result Date: 05/14/2023 CLINICAL DATA:  Mental status change of unknown cause.  Seizures. EXAM: CT HEAD WITHOUT CONTRAST TECHNIQUE: Contiguous axial images were obtained from the base of the skull through the vertex without intravenous contrast. RADIATION DOSE REDUCTION: This exam was performed according to the departmental dose-optimization program which  includes automated exposure control, adjustment of the mA and/or kV according to patient size and/or use of iterative reconstruction technique. COMPARISON:  MRI brain 03/17/2023.  CT head 03/17/2023 FINDINGS: Brain: Diffuse cerebral atrophy. Ventricular dilatation is likely due to central atrophy. Low-attenuation changes in the deep white matter consistent with small vessel ischemia. Increasing low-attenuation changes extending to the cortical surface in the vertex bilaterally. This is similar to previous studies likely representing old infarcts or ischemia. No mass-effect or midline shift. No abnormal extra-axial fluid collections. Gray-white matter junctions are distinct.  Basal cisterns are not effaced. No acute intracranial hemorrhage. Vascular: No hyperdense vessel or unexpected calcification. Skull: Normal. Negative for fracture or focal lesion. Sinuses/Orbits: Mild mucosal thickening in the paranasal sinuses. Partial opacification of mastoid air cells bilaterally. Left ocular prosthesis. Other: None. IMPRESSION: 1. No acute intracranial abnormalities. Chronic atrophy and small vessel ischemic changes. Old infarcts bilaterally. 2. Bilateral mastoid effusions. Electronically Signed   By: Burman Nieves M.D.   On: 05/14/2023 00:38     Medications:   sodium chloride   Intravenous Once   amiodarone  100 mg Oral BH-q7a   ascorbic acid  1,000 mg Oral Daily   aspirin EC  81 mg Oral Daily   atorvastatin  80 mg Oral QHS   carvedilol  12.5 mg Oral BID   Chlorhexidine Gluconate Cloth  6 each Topical Q0600   cinacalcet  60 mg Oral Q T,Th,Sat-1800   darbepoetin (ARANESP) injection - DIALYSIS  100 mcg Subcutaneous Q Thu-1800   dorzolamide-timolol  1 drop Right Eye BID   Gerhardt's butt cream   Topical BID   heparin  5,000 Units Subcutaneous Q8H   hydrocortisone  1 Application Rectal BID   insulin aspart  0-6 Units Subcutaneous TID WC   levETIRAcetam  500 mg Oral Once per day on Sunday Monday Wednesday Friday   levETIRAcetam  750 mg Oral Once per day on Tuesday Thursday Saturday   lidocaine-prilocaine  1 Application Topical Once per day on Monday Wednesday Friday   loperamide  2 mg Oral BID   multivitamin  1 tablet Oral QHS   nutrition supplement (JUVEN)  1 packet Oral BID BM   sodium chloride flush  3 mL Intravenous Q12H   sucroferric oxyhydroxide  500 mg Oral TID WC   zinc sulfate (50mg  elemental zinc)  220 mg Oral Daily    Dialysis Orders: [facility]TS. 3 hours 15 minutes. EDW: 77.5 kg (however likely well under this as per outpatient unit). LUE AVF, 15-gauge needles. Flow rates: 400/800. 37 degrees. 2K/2.5 calcium. UF profile #2. Heparin: Loading  1000 units, 400 units/h. Meds: Mircera 100 mcg every 2 weeks (last dose 3/4)   Assessment/Plan: R pleural effusion: S/p thoracentesis with IR on 3/13 Dehiscence R AKA - S/p revision 3/19 by [name].  Hyperkalemia - Not adhering to dialysis Rx, diet. Consider Lokelma  ESRD: HD TTS. Did not get HD Sat d/t high inpatient census. HD today (Sunday)  HTN/volume: BP/volume ok Anemia: Blood loss from stump hematoma. Now resolved. S/p s units PRBCs total since admit (3/13, 3/18, 3/22. Received Aransep 100 on 3/20. Increase next dose. Transfuse prn.  Secondary hyperparathyroidism: Corr Ca 10.08 and phos is trending. Hold Calcitriol for now. Cont Sensipar. Velphoro.  Nutrition:  Alb low, continue protein supplements  Dispo - Plan for discharge to SNF one patient is medically stable  Tomasa Blase PA-C [facility] 05/15/2023,9:10 AM

## 2023-05-15 NOTE — Progress Notes (Signed)
 PROGRESS NOTE  [name].  DOB: 05/20/1970  PCP: [name], FNP NWG:[id number]  DOA: 05/05/2023  LOS: 10 days  Hospital Day: 11  Brief narrative: [name]. is a 53 y.o. male, resident of [facility] with PMH significant for ESRD HD TTS, recent right BKA ([name]), DM2, CAD, CVA, atrial fibrillation not on anticoagulation and seizure disorder. 3/13, patient presented to the ED at [facility] with seizure-like activity which occurred during dialysis.   Workup in the ED showed of large right pleural effusion.  Additionally, thinning of skin of his right BKA amputation stump was noted with a small area of focal ulceration.  The decision was made to transfer him to [facility] where he could be seen by [name] for possible revision of his right BKA stump.   PCCM was consulted to consider a possible thoracentesis.  Subjective: Patient was seen and examined this morning. Lying on bed.  Not in distress.  Family at bedside. Events from yesterday afternoon noted.  Patient complained of pain at the stump site.  Nurses saw swelling, release suture and noted a large amount of blood.  [name] was notified.  Dressing applied. Hemoglobin was low at 6.9 yesterday.  1 unit of PRBC was given.  Only slight improvement to 7.1 today likely because of stump hematoma.    Assessment and plan: Large right pleural effusion  3/13, thoracentesis in IR yielded 1.5 L of hazy amber fluid - felt to likely represent a parapneumonic effusion due to chronic aspiration, versus possible trauma related with history of recent falls per patient -empiric Unasyn was initiated  Chest x-ray postthoracentesis continue to show effusion. CT chest was obtained which showed a very large persisting right effusion 3/14, pigtail chest tube placed by pulmonary with improved drainage 3/15, lytics infused per chest tube -pleural effusion essentially resolved with follow-up chest x-ray noting no significant persisting  effusion 3/17, chest tube was removed at bedside Cytology of pleural fluid did not show evidence of malignant cells   Right AKA stump wound dehiscence Seen by [name]. 3/19, underwent right AKA revision. 3/22, patient complained of pain at the stump site.  Nurses saw swelling, release suture and noted a large amount of blood.  [name] was notified.  Dressing applied. [name] to follow-up   Seizure disorder suspected breakthrough seizure Neurology suggested continuing Keppra 500 mg daily with additional 250 mg on dialysis days after dialysis - clonazepam was used at 0.5 mg twice daily for 3 days as a bridge while underlying infection was being corrected    ESRD HD TTS Per nephrology.   Acute on chronic anemia  anemia of CKD Hemoglobin low mostly between 7 and 8.  Received 2 units of PRBC transfusion this hospitalization.   Hemoglobin was low at 6.9 yesterday.  1 unit of PRBC was given.  Only slight improvement to 7.1 today likely because of stump hematoma.   Recent Labs    02/19/23 0259 02/21/23 0257 03/26/23 0526 03/29/23 1211 05/10/23 0839 05/11/23 0820 05/12/23 1418 05/14/23 0831 05/15/23 0613  HGB 8.0*   < > 7.8*   < > 7.2* 8.5* 7.4* 6.9* 7.1*  MCV 76.8*   < > 75.5*   < > 72.1* 73.2* 74.0* 72.9* 73.6*  VITAMINB12  --   --  501  --   --   --   --   --   --   FOLATE  --   --  6.1  --   --   --   --   --   --  FERRITIN  --   --  820*  --   --   --   --   --   --   TIBC 134*  --  141*  --   --   --   --   --   --   IRON 34*  --  43*  --   --   --   --   --   --   RETICCTPCT  --   --  1.1  --   --   --   --   --   --    < > = values in this interval not displayed.   Chronic loose stools Rectal tube removed on 3/20. Currently on Imodium twice daily.  Diarrhea improving.  Switch Imodium to nightly only   Chronic diastolic CHF CHF Recent echo with EF 55-60% Blood pressure and volume management per dialysis -no gross overload presently Currently on carvedilol 12.5 mg  twice daily Midodrine on hold.  Blood pressure stable   CAD S/p DES to LAD 2022 HLD Currently no anginal symptoms  Continue carvedilol, aspirin, statin    Chronic atrial fibrillation Rate controlled on amiodarone.   Not on anticoagulation -presumably due to anemia  Type 2 diabetes mellitus HypOglycemia A1c 5 on 05/05/2023 PTA meds-none While n.p.o. for procedure, blood sugar level was low.  After he is allowed to eat, blood sugar level stable. Recent Labs  Lab 05/11/23 1436 05/11/23 1656 05/11/23 2036 05/13/23 0820 05/13/23 2110  GLUCAP 79 127* 131* 96 126*   Mobility: Needs PT  Goals of care   Code Status: Full Code     DVT prophylaxis:  SCD's Start: 05/11/23 1519 heparin injection 5,000 Units Start: 05/05/23 2200 TED hose Start: 05/05/23 1023 SCDs Start: 05/05/23 1023   Antimicrobials: None currently Fluid: None Consultants: Nephrology, orthopedics Family Communication: None at bedside  Status: Inpatient Level of care:  Med-Surg   Patient is from: [facility] rehab center for short-term rehab Needs to continue in-hospital care: Medically stable. Anticipated d/c to: Hopefully back to rehab in 1 to 2 days. Pending Research scientist (life sciences).      Diet:  Diet Order             Diet renal with fluid restriction Fluid restriction: 1200 mL Fluid; Room service appropriate? Yes; Fluid consistency: Thin  Diet effective now                   Scheduled Meds:  sodium chloride   Intravenous Once   amiodarone  100 mg Oral BH-q7a   ascorbic acid  1,000 mg Oral Daily   aspirin EC  81 mg Oral Daily   atorvastatin  80 mg Oral QHS   carvedilol  12.5 mg Oral BID   Chlorhexidine Gluconate Cloth  6 each Topical Q0600   cinacalcet  60 mg Oral Q T,Th,Sat-1800   darbepoetin (ARANESP) injection - DIALYSIS  100 mcg Subcutaneous Q Thu-1800   dorzolamide-timolol  1 drop Right Eye BID   Gerhardt's butt cream   Topical BID   heparin  5,000 Units Subcutaneous Q8H    hydrocortisone  1 Application Rectal BID   insulin aspart  0-6 Units Subcutaneous TID WC   levETIRAcetam  500 mg Oral Once per day on Sunday Monday Wednesday Friday   levETIRAcetam  750 mg Oral Once per day on Tuesday Thursday Saturday   lidocaine-prilocaine  1 Application Topical Once per day on Monday Wednesday Friday   loperamide  2 mg  Oral BID   multivitamin  1 tablet Oral QHS   nutrition supplement (JUVEN)  1 packet Oral BID BM   sodium chloride flush  3 mL Intravenous Q12H   sucroferric oxyhydroxide  500 mg Oral TID WC   zinc sulfate (50mg  elemental zinc)  220 mg Oral Daily    PRN meds: acetaminophen **OR** [DISCONTINUED] acetaminophen, bisacodyl, hydrALAZINE, HYDROmorphone (DILAUDID) injection, ipratropium, levalbuterol, ondansetron **OR** ondansetron (ZOFRAN) IV, oxyCODONE, senna-docusate, sodium chloride flush, zolpidem   Infusions:     Antimicrobials: Anti-infectives (From admission, onward)    Start     Dose/Rate Route Frequency Ordered Stop   05/11/23 1405  vancomycin (VANCOCIN) powder  Status:  Discontinued          As needed 05/11/23 1405 05/11/23 1429   05/11/23 0600  ceFAZolin (ANCEF) IVPB 2g/100 mL premix        2 g 200 mL/hr over 30 Minutes Intravenous On call to O.R. 05/10/23 2131 05/11/23 1346   05/05/23 1800  Ampicillin-Sulbactam (UNASYN) 3 g in sodium chloride 0.9 % 100 mL IVPB        3 g 200 mL/hr over 30 Minutes Intravenous Every 24 hours 05/05/23 1643 05/10/23 0045       Objective: Vitals:   05/14/23 2043 05/15/23 0432  BP: (!) 127/53 136/62  Pulse: 63 64  Resp: 17 17  Temp: 98.2 F (36.8 C) 98.9 F (37.2 C)  SpO2: 97% 96%    Intake/Output Summary (Last 24 hours) at 05/15/2023 1009 Last data filed at 05/15/2023 0600 Gross per 24 hour  Intake 708 ml  Output 0 ml  Net 708 ml   Filed Weights   05/12/23 1658 05/13/23 0500 05/15/23 0432  Weight: 68 kg 71.1 kg 69.4 kg   Weight change:  Body mass index is 19.12 kg/m.   Physical  Exam: General exam: Pleasant, middle-aged African-American male.  Skin: No rashes, lesions or ulcers. HEENT: Atraumatic, normocephalic, no obvious bleeding Lungs: Clear to auscultation bilaterally,  CVS: S1, S2, no murmur,   GI/Abd: Soft, nontender, nondistended, bowel sound present,   CNS: Alert, awake, slow to respond.  Oriented x 3 Psychiatry: Mood appropriate,  Extremities: No pedal edema, no calf tenderness.  Right AKA stump has dressing applied  Data Review: I have personally reviewed the laboratory data and studies available.  F/u labs ordered Unresulted Labs (From admission, onward)     Start     Ordered   05/16/23 0500  Basic metabolic panel  Tomorrow morning,   R       Question:  Specimen collection method  Answer:  Lab=Lab collect   05/15/23 0818   05/16/23 0500  CBC with Differential/Platelet  Tomorrow morning,   R       Question:  Specimen collection method  Answer:  Lab=Lab collect   05/15/23 0818           Total time spent in review of labs and imaging, patient evaluation, formulation of plan, documentation and communication with family: 45 minutes  Signed, Lorin Glass, MD [facility] 05/15/2023

## 2023-05-15 NOTE — Progress Notes (Signed)
 Received patient in bed.Alert and oriented x 2.He is legally blind This procedure has been fully reviewed with the patient and written informed consent has been obtained and verified.  Access used : Left arm AVF worked well.  Duration of treatment 2 out of 3 hours of prescribed.  Net uf: 1.1 liter out of 2 liters.  Hemo comment: He quit on his last hour of treatment.He shouted that,I want to go back into my room,stop the machine".   Hand off to the patient's nurse : Back into his room with stable medical condition

## 2023-05-16 DIAGNOSIS — J90 Pleural effusion, not elsewhere classified: Secondary | ICD-10-CM | POA: Diagnosis not present

## 2023-05-16 LAB — CBC WITH DIFFERENTIAL/PLATELET
Abs Immature Granulocytes: 0.07 10*3/uL (ref 0.00–0.07)
Basophils Absolute: 0.1 10*3/uL (ref 0.0–0.1)
Basophils Relative: 1 %
Eosinophils Absolute: 1.2 10*3/uL — ABNORMAL HIGH (ref 0.0–0.5)
Eosinophils Relative: 12 %
HCT: 23.9 % — ABNORMAL LOW (ref 39.0–52.0)
Hemoglobin: 7.3 g/dL — ABNORMAL LOW (ref 13.0–17.0)
Immature Granulocytes: 1 %
Lymphocytes Relative: 13 %
Lymphs Abs: 1.3 10*3/uL (ref 0.7–4.0)
MCH: 23 pg — ABNORMAL LOW (ref 26.0–34.0)
MCHC: 30.5 g/dL (ref 30.0–36.0)
MCV: 75.2 fL — ABNORMAL LOW (ref 80.0–100.0)
Monocytes Absolute: 1.2 10*3/uL — ABNORMAL HIGH (ref 0.1–1.0)
Monocytes Relative: 12 %
Neutro Abs: 6.3 10*3/uL (ref 1.7–7.7)
Neutrophils Relative %: 61 %
Platelets: 216 10*3/uL (ref 150–400)
RBC: 3.18 MIL/uL — ABNORMAL LOW (ref 4.22–5.81)
RDW: 22.5 % — ABNORMAL HIGH (ref 11.5–15.5)
WBC: 10 10*3/uL (ref 4.0–10.5)
nRBC: 0 % (ref 0.0–0.2)

## 2023-05-16 LAB — GLUCOSE, CAPILLARY: Glucose-Capillary: 104 mg/dL — ABNORMAL HIGH (ref 70–99)

## 2023-05-16 MED ORDER — DARBEPOETIN ALFA 150 MCG/0.3ML IJ SOSY: 150.0000 ug | PREFILLED_SYRINGE | INTRAMUSCULAR | Status: DC

## 2023-05-16 MED ORDER — CHLORHEXIDINE GLUCONATE CLOTH 2 % EX PADS
6.0000 | MEDICATED_PAD | Freq: Every day | CUTANEOUS | Status: DC
  Administered 2023-05-17: 6 via TOPICAL

## 2023-05-16 MED ORDER — SODIUM ZIRCONIUM CYCLOSILICATE 10 G PO PACK
10.0000 g | PACK | ORAL | Status: DC
  Administered 2023-05-16: 10 g via ORAL
  Filled 2023-05-16: qty 1

## 2023-05-16 NOTE — Progress Notes (Signed)
 Physical Therapy Treatment Patient Details Name: [name]. MRN: [id number] DOB: 1970-08-06 Today's Date: 05/16/2023   History of Present Illness 53 year old admitted 3/13 found to have large right pleural effusion, recent right AKA on 04/06/2023 with new stump ulceration, suspected breakthrough seizure. Chest tube placed (right) 3/14 and subsequently removed.  Now s/p AKA revision on 05/11/23. PMHx: ESRD on HD TTS, recent right BKA ([name]), CVA, atrial fibrillation not on anticoagulation, DM2, and seizure disorder.    PT Comments  Pt received in bed and agreeable to working with PT, somewhat lethargic. Pt needing max A +2 for bed mobility and very hypersensitive to touch RLE. Pt also c/o L hip pain and is very tight in inner thigh. Worked on sit>stand with use of steady. Pt able to get pressure relief to buttocks but not fully clear buttocks from bed even with max A +2. Worked on scooting along bedside as well.  Pt incontinent of bowel as he returned to supine. Tolerated rolling and bridging better after working on standing than he did before mobility. Pt has been denied from SNF so recommending HHPT and pt will need hospital bed, hoyer lift, and w/c. PT will continue to follow.    If plan is discharge home, recommend the following: Two people to help with walking and/or transfers;Two people to help with bathing/dressing/bathroom;Direct supervision/assist for medications management;Help with stairs or ramp for entrance;Assist for transportation;Assistance with cooking/housework;Direct supervision/assist for financial management;Supervision due to cognitive status   Can travel by private vehicle     No  Equipment Recommendations  Hospital bed;Hoyer lift;Other (comment) (sliding board)    Recommendations for Other Services       Precautions / Restrictions Precautions Precautions: Fall Recall of Precautions/Restrictions: Intact Precaution/Restrictions Comments: R  AKA Restrictions Weight Bearing Restrictions Per Provider Order: Yes LLE Weight Bearing Per Provider Order: Non weight bearing     Mobility  Bed Mobility Overal bed mobility: Needs Assistance Bed Mobility: Supine to Sit, Sit to Supine Rolling: +2 for physical assistance, Mod assist Sidelying to sit: Used rails, +2 for physical assistance, Max assist Supine to sit: +2 for physical assistance, Max assist Sit to supine: Max assist, +2 for physical assistance   General bed mobility comments: pt needs increased time for all mobility and it very controlling of situation, eg does not like bed pad to be pulled to get his hips to EOB but he cannot scoot himself. Needed max A and increased time for coming up to sitting and return to supine. After working on sit>stand, rolled multiple times for bowel clean up as well as bridging with LLE to place bed pan    Transfers Overall transfer level: Needs assistance Equipment used: Ambulation equipment used Transfers: Sit to/from Stand Sit to Stand: Via lift equipment, Max assist, +2 physical assistance, +2 safety/equipment           General transfer comment: worked on initiating sit>stand with stedy. Pt able to get pressure relief from buttocks but never able to clear buttocks from bed. 5 trials with max A +2. Part of difficulty for pt is L hip tightness that keeps him from properly aligning LLE to be able to push with it to stand. Worked on scooting along EOB after transfer training Transfer via Financial trader: Stedy  Ambulation/Gait               General Gait Details: unable   Optometrist  Tilt Bed    Modified Rankin (Stroke Patients Only)       Balance Overall balance assessment: Needs assistance Sitting-balance support: Bilateral upper extremity supported Sitting balance-Leahy Scale: Poor Sitting balance - Comments: posterior lean in sitting. Improved with time up and fwd wt shift for  attempted standing       Standing balance comment: unable                            Communication Communication Communication: No apparent difficulties  Cognition Arousal: Lethargic Behavior During Therapy: Flat affect   PT - Cognitive impairments: Problem solving, Safety/Judgement                       PT - Cognition Comments: slow processing, some limited safety awareness; wife not present, unknown baseline Following commands: Impaired Following commands impaired: Only follows one step commands consistently, Follows one step commands with increased time    Cueing Cueing Techniques: Verbal cues, Tactile cues  Exercises Amputee Exercises Hip Extension: AROM, Right, 5 reps (pushing down into mattress with R LE) Hip ABduction/ADduction: Left, 5 reps, Supine, AROM Hip Flexion/Marching:  (x3 reps) Other Exercises Other Exercises: dynamic reaching sitting EOB for trunk activation    General Comments General comments (skin integrity, edema, etc.): instructed pt to work on L hip IR and ER in bed      Pertinent Vitals/Pain Pain Assessment Pain Assessment: Faces Faces Pain Scale: Hurts whole lot Pain Location: R AKA revision, L hip Pain Descriptors / Indicators: Aching, Discomfort, Grimacing, Guarding, Sore Pain Intervention(s): Limited activity within patient's tolerance, Monitored during session    Home Living                          Prior Function            PT Goals (current goals can now be found in the care plan section) Acute Rehab PT Goals Patient Stated Goal: Improve strength, independence. PT Goal Formulation: With patient Time For Goal Achievement: 05/26/23 Potential to Achieve Goals: Fair Progress towards PT goals: Progressing toward goals    Frequency    Min 2X/week      PT Plan      Co-evaluation              AM-PAC PT "6 Clicks" Mobility   Outcome Measure  Help needed turning from your back to your side  while in a flat bed without using bedrails?: Total Help needed moving from lying on your back to sitting on the side of a flat bed without using bedrails?: Total Help needed moving to and from a bed to a chair (including a wheelchair)?: Total Help needed standing up from a chair using your arms (e.g., wheelchair or bedside chair)?: Total Help needed to walk in hospital room?: Total Help needed climbing 3-5 steps with a railing? : Total 6 Click Score: 6    End of Session Equipment Utilized During Treatment: Gait belt Activity Tolerance: Patient limited by pain Patient left: in bed;with call bell/phone within reach;with bed alarm set Nurse Communication: Mobility status PT Visit Diagnosis: Other abnormalities of gait and mobility (R26.89);Pain;Muscle weakness (generalized) (M62.81) Pain - Right/Left: Right Pain - part of body: Leg     Time: 1610-9604 PT Time Calculation (min) (ACUTE ONLY): 40 min  Charges:    $Therapeutic Exercise: 8-22 mins $Therapeutic Activity: 23-37 mins PT General Charges $$ ACUTE  PT VISIT: 1 Visit                     Lyanne Co, PT  Acute Rehab Services Secure chat preferred Office [phone]    Lawana Chambers Julia Kulzer 05/16/2023, 2:00 PM

## 2023-05-16 NOTE — Plan of Care (Signed)

## 2023-05-16 NOTE — Progress Notes (Signed)
 [facility] Progress Note   Subjective:    Seen and examined patient at bedside. Noted he s/o dialysis 1 hour early yesterday. Noted net UF 1.1L. Unfortunately, he's been s/o treatment frequently here. Ordered Lokelma for non-HD days. Next HD 3/25.  Objective Vitals:   05/15/23 2048 05/15/23 2151 05/16/23 0435 05/16/23 0730  BP:  (!) 149/71 (!) 159/75 (!) 170/73  Pulse:  63 65 64  Resp:  18 18 18   Temp:  98.4 F (36.9 C) 98.5 F (36.9 C)   TempSrc:   Oral   SpO2:  100% 97% 93%  Weight: 68.1 kg     Height:       Physical Exam General: Alert male in NAD Heart: RRR, no murmur Lungs: Clear; Abdomen: Non-distended Extremities: No LE edema: R AKA  Dialysis Access: AVF +bruit   Filed Weights   05/15/23 0432 05/15/23 1800 05/15/23 2048  Weight: 69.4 kg 69.5 kg 68.1 kg    Intake/Output Summary (Last 24 hours) at 05/16/2023 1100 Last data filed at 05/16/2023 0600 Gross per 24 hour  Intake 120 ml  Output 1100 ml  Net -980 ml    Additional Objective Labs: Basic Metabolic Panel: Recent Labs  Lab 05/10/23 0839 05/11/23 0820 05/12/23 1418 05/13/23 1105 05/15/23 0613  NA 138 140 138 138 137  K 6.0* 5.6* 5.5* 5.2* 5.4*  CL 105 102 100 98 98  CO2 19* 23 24 25 24   GLUCOSE 70 77 80 115* 109*  BUN 83* 60* 79* 65* 89*  CREATININE 13.39* 9.93* 11.63* 9.00* 11.51*  CALCIUM 8.4* 8.4* 8.9 8.6* 8.5*  PHOS 10.3* 7.5* 8.1*  --   --    Liver Function Tests: Recent Labs  Lab 05/10/23 0839 05/11/23 0820 05/12/23 1418  ALBUMIN 1.9* 1.9* 1.9*   No results for input(s): "LIPASE", "AMYLASE" in the last 168 hours. CBC: Recent Labs  Lab 05/10/23 0839 05/11/23 0820 05/12/23 1418 05/14/23 0831 05/15/23 0613  WBC 8.3 9.6 12.9* 11.3* 11.1*  NEUTROABS  --  5.8 9.4*  --  7.3  HGB 7.2* 8.5* 7.4* 6.9* 7.1*  HCT 24.5* 27.8* 24.7* 22.6* 23.1*  MCV 72.1* 73.2* 74.0* 72.9* 73.6*  PLT 248 240 210 185 204   Blood Culture    Component Value Date/Time   SDES FLUID  PLEURAL RIGHT 05/05/2023 1443   SDES FLUID PLEURAL RIGHT 05/05/2023 1443   SPECREQUEST BOTTLES DRAWN AEROBIC AND ANAEROBIC 05/05/2023 1443   SPECREQUEST NONE 05/05/2023 1443   CULT  05/05/2023 1443    NO GROWTH 5 DAYS Performed at [facility], [address] [place], [place] [address]    REPTSTATUS 05/10/2023 FINAL 05/05/2023 1443   REPTSTATUS 05/05/2023 FINAL 05/05/2023 1443    Cardiac Enzymes: No results for input(s): "CKTOTAL", "CKMB", "CKMBINDEX", "TROPONINI" in the last 168 hours. CBG: Recent Labs  Lab 05/11/23 1436 05/11/23 1656 05/11/23 2036 05/13/23 0820 05/13/23 2110  GLUCAP 79 127* 131* 96 126*   Iron Studies: No results for input(s): "IRON", "TIBC", "TRANSFERRIN", "FERRITIN" in the last 72 hours. Lab Results  Component Value Date   INR 1.2 05/06/2023   INR 1.2 05/05/2023   INR 1.1 03/01/2022   Studies/Results: No results found.  Medications:   sodium chloride   Intravenous Once   amiodarone  100 mg Oral BH-q7a   ascorbic acid  1,000 mg Oral Daily   aspirin EC  81 mg Oral Daily   atorvastatin  80 mg Oral QHS   carvedilol  12.5 mg Oral BID  Chlorhexidine Gluconate Cloth  6 each Topical Q0600   cinacalcet  60 mg Oral Q T,Th,Sat-1800   darbepoetin (ARANESP) injection - DIALYSIS  100 mcg Subcutaneous Q Thu-1800   dorzolamide-timolol  1 drop Right Eye BID   Gerhardt's butt cream   Topical BID   heparin  5,000 Units Subcutaneous Q8H   hydrocortisone  1 Application Rectal BID   insulin aspart  0-6 Units Subcutaneous TID WC   levETIRAcetam  500 mg Oral Once per day on Sunday Monday Wednesday Friday   levETIRAcetam  750 mg Oral Once per day on Tuesday Thursday Saturday   lidocaine-prilocaine  1 Application Topical Once per day on Monday Wednesday Friday   loperamide  2 mg Oral QHS   multivitamin  1 tablet Oral QHS   nutrition supplement (JUVEN)  1 packet Oral BID BM   sodium chloride flush  3 mL Intravenous Q12H   sucroferric oxyhydroxide  500 mg  Oral TID WC   zinc sulfate (50mg  elemental zinc)  220 mg Oral Daily    Dialysis Orders: [facility] TTS. 3 hours 15 minutes. EDW: 77.5 kg (however likely well under this as per outpatient unit). LUE AVF, 15-gauge needles. Flow rates: 400/800. 37 degrees. 2K/2.5 calcium. UF profile #2. Heparin: Loading 1000 units, 400 units/h. Meds: Mircera 100 mcg every 2 weeks (last dose 3/4)   Assessment/Plan: R pleural effusion: S/p thoracentesis with IR on 3/13 Dehiscence R AKA - S/p revision 3/19 by [name].  Hyperkalemia - Not adhering to dialysis Rx, diet. Started Lokelma on non-HD days ESRD: HD TTS. Did not get HD Sat d/t high inpatient census. Patient signed off HD 1 hour early today. Plan for HD 3/25. HTN/volume: BP/volume ok Anemia: Blood loss from stump hematoma. Now resolved. S/p s units PRBCs total since admit (3/13, 3/18, 3/22. Received Aransep 100 on 3/20. Dose raised today. Transfuse prn.  Secondary hyperparathyroidism: Corr Ca 10.08 and phos is trending. Hold Calcitriol for now. Cont Sensipar. Velphoro.  Nutrition:  Alb low, continue protein supplements  Dispo - Plan for discharge to SNF one patient is medically stable  Salome Holmes, NP [facility] 05/16/2023,11:00 AM  LOS: 11 days

## 2023-05-16 NOTE — Progress Notes (Signed)
 PROGRESS NOTE  [name].  DOB: [birth year]  PCP: [name], FNP JYN:[id number]  DOA: 05/05/2023  LOS: 11 days  Hospital Day: 12  Brief narrative: [name] Barns. is a 53 y.o. male, resident of [facility] with PMH significant for ESRD HD TTS, recent right BKA ([name]), DM2, CAD, CVA, atrial fibrillation not on anticoagulation and seizure disorder. 3/13, patient presented to the ED at [facility] with seizure-like activity which occurred during dialysis.   Workup in the ED showed of large right pleural effusion.  Additionally, thinning of skin of his right BKA amputation stump was noted with a small area of focal ulceration.  The decision was made to transfer him to [facility] where he could be seen by [name] for possible revision of his right BKA stump.   PCCM was consulted to consider a possible thoracentesis.  Subjective: Patient was seen and examined this morning. Lying down in bed.  Not in distress Nephrology pending.  [name] to follow-up.  Assessment and plan: Large right pleural effusion  3/13, thoracentesis in IR yielded 1.5 L of hazy amber fluid - felt to likely represent a parapneumonic effusion due to chronic aspiration, versus possible trauma related with history of recent falls per patient -empiric Unasyn was initiated  Chest x-ray postthoracentesis continue to show effusion. CT chest was obtained which showed a very large persisting right effusion 3/14, pigtail chest tube placed by pulmonary with improved drainage 3/15, lytics infused per chest tube -pleural effusion essentially resolved with follow-up chest x-ray noting no significant persisting effusion 3/17, chest tube was removed at bedside Cytology of pleural fluid did not show evidence of malignant cells   Right AKA stump wound dehiscence Seen by [name]. 3/19, underwent right AKA revision. 3/22, patient complained of pain at the stump site.  Nurses saw swelling, release suture and noted a  large amount of blood.  [name] was notified.  Dressing applied. [name] to follow-up   Seizure disorder suspected breakthrough seizure Neurology suggested continuing Keppra 500 mg daily with additional 250 mg on dialysis days after dialysis - clonazepam was used at 0.5 mg twice daily for 3 days as a bridge while underlying infection was being corrected    ESRD HD TTS Per nephrology.   Acute on chronic anemia  anemia of CKD Hemoglobin low mostly between 7 and 8.  Received 2 units of PRBC transfusion this hospitalization.   Hemoglobin was low at 6.9 on 3/22.  1 unit of PRBC was given.  Only slight improvement to 7.1 likely because of stump hematoma.   Pending labs this morning. Recent Labs    02/19/23 0259 02/21/23 0257 03/26/23 0526 03/29/23 1211 05/10/23 0839 05/11/23 0820 05/12/23 1418 05/14/23 0831 05/15/23 0613  HGB 8.0*   < > 7.8*   < > 7.2* 8.5* 7.4* 6.9* 7.1*  MCV 76.8*   < > 75.5*   < > 72.1* 73.2* 74.0* 72.9* 73.6*  VITAMINB12  --   --  501  --   --   --   --   --   --   FOLATE  --   --  6.1  --   --   --   --   --   --   FERRITIN  --   --  820*  --   --   --   --   --   --   TIBC 134*  --  141*  --   --   --   --   --   --  IRON 34*  --  43*  --   --   --   --   --   --   RETICCTPCT  --   --  1.1  --   --   --   --   --   --    < > = values in this interval not displayed.   Chronic loose stools Rectal tube removed on 3/20. Improving.  Currently on imodium to nightly only   Chronic diastolic CHF CHF Recent echo with EF 55-60% Blood pressure and volume management per dialysis -no gross overload presently Currently on carvedilol 12.5 mg twice daily Midodrine on hold.  Blood pressure stable   CAD S/p DES to LAD 2022 HLD Currently no anginal symptoms  Continue carvedilol, aspirin, statin    Chronic atrial fibrillation Rate controlled on amiodarone.   Not on anticoagulation-presumably due to anemia  Type 2 diabetes mellitus HypOglycemia A1c 5 on  05/05/2023 PTA meds-none While n.p.o. for procedure, blood sugar level was low.  After he is allowed to eat, blood sugar level stable. Recent Labs  Lab 05/11/23 1436 05/11/23 1656 05/11/23 2036 05/13/23 0820 05/13/23 2110  GLUCAP 79 127* 131* 96 126*   Mobility: Needs PT  Goals of care   Code Status: Full Code     DVT prophylaxis:  SCD's Start: 05/11/23 1519 heparin injection 5,000 Units Start: 05/05/23 2200 TED hose Start: 05/05/23 1023 SCDs Start: 05/05/23 1023   Antimicrobials: None currently Fluid: None Consultants: Nephrology, orthopedics Family Communication: None at bedside  Status: Inpatient Level of care:  Med-Surg   Patient is from: [facility] rehab center for short-term rehab Needs to continue in-hospital care: Medically stable. Anticipated d/c to: Hopefully back to rehab in 1 to 2 days. Pending Research scientist (life sciences).      Diet:  Diet Order             Diet renal with fluid restriction Fluid restriction: 1200 mL Fluid; Room service appropriate? Yes; Fluid consistency: Thin  Diet effective now                   Scheduled Meds:  sodium chloride   Intravenous Once   amiodarone  100 mg Oral BH-q7a   ascorbic acid  1,000 mg Oral Daily   aspirin EC  81 mg Oral Daily   atorvastatin  80 mg Oral QHS   carvedilol  12.5 mg Oral BID   Chlorhexidine Gluconate Cloth  6 each Topical Q0600   cinacalcet  60 mg Oral Q T,Th,Sat-1800   [START ON 05/19/2023] darbepoetin (ARANESP) injection - DIALYSIS  150 mcg Subcutaneous Q Thu-1800   dorzolamide-timolol  1 drop Right Eye BID   Gerhardt's butt cream   Topical BID   heparin  5,000 Units Subcutaneous Q8H   hydrocortisone  1 Application Rectal BID   insulin aspart  0-6 Units Subcutaneous TID WC   levETIRAcetam  500 mg Oral Once per day on Sunday Monday Wednesday Friday   levETIRAcetam  750 mg Oral Once per day on Tuesday Thursday Saturday   lidocaine-prilocaine  1 Application Topical Once per day on Monday  Wednesday Friday   loperamide  2 mg Oral QHS   multivitamin  1 tablet Oral QHS   nutrition supplement (JUVEN)  1 packet Oral BID BM   sodium chloride flush  3 mL Intravenous Q12H   sodium zirconium cyclosilicate  10 g Oral Once per day on Sunday Monday Wednesday Friday   sucroferric oxyhydroxide  500 mg Oral  TID WC   zinc sulfate (50mg  elemental zinc)  220 mg Oral Daily    PRN meds: acetaminophen **OR** [DISCONTINUED] acetaminophen, bisacodyl, hydrALAZINE, HYDROmorphone (DILAUDID) injection, ipratropium, levalbuterol, ondansetron **OR** ondansetron (ZOFRAN) IV, oxyCODONE, senna-docusate, sodium chloride flush, zolpidem   Infusions:     Antimicrobials: Anti-infectives (From admission, onward)    Start     Dose/Rate Route Frequency Ordered Stop   05/11/23 1405  vancomycin (VANCOCIN) powder  Status:  Discontinued          As needed 05/11/23 1405 05/11/23 1429   05/11/23 0600  ceFAZolin (ANCEF) IVPB 2g/100 mL premix        2 g 200 mL/hr over 30 Minutes Intravenous On call to O.R. 05/10/23 2131 05/11/23 1346   05/05/23 1800  Ampicillin-Sulbactam (UNASYN) 3 g in sodium chloride 0.9 % 100 mL IVPB        3 g 200 mL/hr over 30 Minutes Intravenous Every 24 hours 05/05/23 1643 05/10/23 0045       Objective: Vitals:   05/16/23 0435 05/16/23 0730  BP: (!) 159/75 (!) 170/73  Pulse: 65 64  Resp: 18 18  Temp: 98.5 F (36.9 C)   SpO2: 97% 93%    Intake/Output Summary (Last 24 hours) at 05/16/2023 1119 Last data filed at 05/16/2023 0600 Gross per 24 hour  Intake 120 ml  Output 1100 ml  Net -980 ml   Filed Weights   05/15/23 0432 05/15/23 1800 05/15/23 2048  Weight: 69.4 kg 69.5 kg 68.1 kg   Weight change: 0.1 kg Body mass index is 18.77 kg/m.   Physical Exam: General exam: Pleasant, middle-aged African-American male.  Skin: No rashes, lesions or ulcers. HEENT: Atraumatic, normocephalic, no obvious bleeding Lungs: Clear to auscultation bilaterally,  CVS: S1, S2, no murmur,    GI/Abd: Soft, nontender, nondistended, bowel sound present,   CNS: Alert, awake, slow to respond.  Oriented x 3 Psychiatry: Mood appropriate,  Extremities: No pedal edema, no calf tenderness.  Right AKA stump has dressing applied  Data Review: I have personally reviewed the laboratory data and studies available.  F/u labs ordered Unresulted Labs (From admission, onward)     Start     Ordered   05/17/23 0500  Renal function panel  Once,   R       Question:  Specimen collection method  Answer:  Lab=Lab collect   05/16/23 1109   05/16/23 0500  CBC with Differential/Platelet  Tomorrow morning,   R       Question:  Specimen collection method  Answer:  Lab=Lab collect   05/15/23 0818           Total time spent in review of labs and imaging, patient evaluation, formulation of plan, documentation and communication with family: 45 minutes  Signed, Lorin Glass, MD [facility] 05/16/2023

## 2023-05-16 NOTE — Progress Notes (Signed)
    Durable Medical Equipment  (From admission, onward)           Start     Ordered   05/16/23 1417  For home use only DME standard manual wheelchair with seat cushion  Once       Comments: Patient suffers from right AKA status which impairs their ability to perform daily activities like bathing, dressing, feeding, grooming, and toileting in the home.  A cane, crutch, or walker will not resolve issue with performing activities of daily living. A wheelchair will allow patient to safely perform daily activities. Patient can safely propel the wheelchair in the home or has a caregiver who can provide assistance. Length of need Lifetime. Accessories: elevating leg rests (ELRs), wheel locks, extensions and anti-tippers.   05/16/23 1417   05/16/23 1417  For home use only DME Other see comment  Once       Comments: Transfer board  Question:  Length of Need  Answer:  Lifetime   05/16/23 1417   05/16/23 1342  For home use only DME Hospital bed  Once       Question Answer Comment  Length of Need Lifetime   Patient has (list medical condition): ESRD on HD, DM 2, right AKA status, impaired mobility   Bed type Semi-electric   Hoyer Lift Yes   Support Surface: Gel Overlay      05/16/23 1342

## 2023-05-16 NOTE — TOC Progression Note (Signed)
 Transition of Care (TOC) - Progression Note    Patient Details  Name: [name]. MRN: [id number] Date of Birth: [birth year]  Transition of Care [facility]) CM/SW Contact  Tom-Johnson, Hershal Coria, RN Phone Number: 05/16/2023, 4:35 PM  Clinical Narrative:     CM informed by MD that P2P was denied for SNF placement. Other option for patient is home with home health.  CM called [facility] and spoke with Kandee Keen per patient's wife [name]'s request. Kandee Keen states patient was assessed prior to admit and was deemed unsafe to stay home by himself and declined referral. [facility]- Does not service patient's area of address.  [facility]- Does not have nursing for patient's area of address.  [facility]- Not in network with patient's insurance. [facility]- Does not have nursing to service patient's area of address. [facility]- Does not service patient's area of address.  [facility]- Does not service patient's area of address.  [facility]- Out of Service area.  [facility]- Out of service area. [facility]- Out of service area. [facility]- No availability in patient's area of address.   Wife, MD notified.  CM ordered Hospital bed, hoyer lift, sliding board and wheelchair from [employer], Earna Coder to deliver to patient's home.   CM will continue to follow as patient progresses with care towards discharge.       Expected Discharge Plan: Skilled Nursing Facility Barriers to Discharge: Continued Medical Work up  Expected Discharge Plan and Services In-house Referral: Clinical Social Work   Post Acute Care Choice: Skilled Nursing Facility Living arrangements for the past 2 months: Skilled Nursing Facility, Single Family Home                                       Social Determinants of Health (SDOH) Interventions SDOH Screenings   Food Insecurity: No Food Insecurity (05/05/2023)  Housing: High Risk (05/05/2023)  Transportation Needs: No Transportation  Needs (05/05/2023)  Utilities: Not At Risk (05/05/2023)  Financial Resource Strain: High Risk (07/05/2017)  Physical Activity: Inactive (07/05/2017)  Social Connections: Socially Integrated (05/05/2023)  Stress: Stress Concern Present (07/05/2017)  Tobacco Use: Low Risk  (05/11/2023)  Health Literacy: Low Risk  (06/01/2020)   [facility]    Readmission Risk Interventions    07/27/2021   11:49 AM 11/27/2020    3:06 PM  Readmission Risk Prevention Plan  Transportation Screening Complete Complete  PCP or Specialist Appt within 3-5 Days  Complete  HRI or Home Care Consult  Complete  Social Work Consult for Recovery Care Planning/Counseling  Complete  Palliative Care Screening  Not Applicable  Medication Review Oceanographer) Complete Complete  SW Recovery Care/Counseling Consult Complete   Palliative Care Screening Not Applicable   Skilled Nursing Facility Not Applicable

## 2023-05-16 NOTE — TOC Progression Note (Addendum)
 Transition of Care (TOC) - Progression Note    Patient Details  Name: [name]. MRN: [id number] Date of Birth: April 18, 1970  [facility]) CM/SW Contact  Marliss Coots, LCSW Phone Number: 05/16/2023, 1:34 PM  Clinical Narrative:     1:34 PM CSW received notification that patient's insurance offered peer to peer for authorization for SNF. Medical team made aware.  1:39 PM Per hospitalist, insurance authorization for SNF was denied. Hospitalist informed medical team and CSW that if patient participates well with therapy and shows improvement remote that he could be a candidate for SNF later.  2:11 PM CSW closed TOC consult (SNF Placement).  Expected Discharge Plan: Skilled Nursing Facility Barriers to Discharge: Continued Medical Work up  Expected Discharge Plan and Services In-house Referral: Clinical Social Work   Post Acute Care Choice: Skilled Nursing Facility Living arrangements for the past 2 months: Skilled Nursing Facility, Single Family Home                                       Social Determinants of Health (SDOH) Interventions SDOH Screenings   Food Insecurity: No Food Insecurity (05/05/2023)  Housing: High Risk (05/05/2023)  Transportation Needs: No Transportation Needs (05/05/2023)  Utilities: Not At Risk (05/05/2023)  Financial Resource Strain: High Risk (07/05/2017)  Physical Activity: Inactive (07/05/2017)  Social Connections: Socially Integrated (05/05/2023)  Stress: Stress Concern Present (07/05/2017)  Tobacco Use: Low Risk  (05/11/2023)  Health Literacy: Low Risk  (06/01/2020)   [facility]    Readmission Risk Interventions    07/27/2021   11:49 AM 11/27/2020    3:06 PM  Readmission Risk Prevention Plan  Transportation Screening Complete Complete  PCP or Specialist Appt within 3-5 Days  Complete  HRI or Home Care Consult  Complete  Social Work Consult for Recovery Care Planning/Counseling   Complete  Palliative Care Screening  Not Applicable  Medication Review Oceanographer) Complete Complete  SW Recovery Care/Counseling Consult Complete   Palliative Care Screening Not Applicable   Skilled Nursing Facility Not Applicable

## 2023-05-17 DIAGNOSIS — J90 Pleural effusion, not elsewhere classified: Secondary | ICD-10-CM | POA: Diagnosis not present

## 2023-05-17 MED ORDER — OXYCODONE HCL 5 MG PO TABS: 5.0000 mg | ORAL_TABLET | ORAL | 0 refills | Status: DC | PRN

## 2023-05-17 NOTE — Plan of Care (Signed)

## 2023-05-17 NOTE — Progress Notes (Signed)
 Contacted [facility] to provide update to clinic. Will assist as needed.   Olivia Canter Renal Navigator [phone]

## 2023-05-17 NOTE — [place] FL2 (Signed)
 [place] MEDICAID FL2 LEVEL OF CARE FORM     IDENTIFICATION  Patient Name: [name]. Birthdate: 06/28/1970 Sex: male Admission Date (Current Location): 05/05/2023  [place] and [place] Number:  [employer] and Address:  [facility] [place], [address], [place], [place] [address]      Provider Number: [id number]  Attending Physician Name and Address:  Lorin Glass, MD  Relative Name and Phone Number:  Birl Lobello; Spouse; [phone]    Current Level of Care: Hospital Recommended Level of Care: [facility] Prior Approval Number:    Date Approved/Denied:   PASRR Number: [id number]  Discharge Plan: Domiciliary (Rest home)    Current Diagnoses: Patient Active Problem List   Diagnosis Date Noted   Hx of AKA (above knee amputation), right (HCC) 05/11/2023   Pleural effusion 05/05/2023   Seizures (HCC) 05/05/2023   Right BKA infection (HCC) 04/04/2023   Pressure injury of skin 04/04/2023   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 02/26/2022   H/O enucleation of left eyeball 02/26/2022   PAD (peripheral artery disease) (HCC) 02/26/2022   History of seizure 02/26/2022   Seasonal allergic rhinitis 07/08/2021   Myoclonus 06/23/2021   Steal syndrome as complication of dialysis access (HCC) 02/06/2021   Coronary artery disease 02/05/2021   Dehiscence of amputation stump of right lower extremity (HCC) 01/29/2021   ESRD on hemodialysis (HCC)    Anemia due to chronic kidney disease, on chronic dialysis (HCC)     Paroxysmal atrial fibrillation (HCC) - not on systemic anticoagulation due to ICH while on Eliquis in 11-2021. 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) 08/19/2020   Anxiety state 08/19/2020   Callosity 08/19/2020   ED (erectile dysfunction) of organic origin 08/19/2020   Long term (current) use of insulin (HCC) 08/19/2020   Myalgia 08/19/2020   CAD/ H/o prior STEMI 02/2020 s/p DES to LAD 08/19/2020   Blindness of left eye 08/15/2020   Combined forms of age-related cataract of right eye 04/17/2020   Moderate protein-calorie malnutrition (HCC) 05/14/2019   Disorder of phosphorus metabolism, unspecified 05/07/2019   Iron deficiency anemia, unspecified 05/07/2019   Secondary hyperparathyroidism of renal origin (HCC) 11/28/2018   Chronic Grade 2 diastolic heart failure/EF 55 to 60% 12/13/2017   Diabetes mellitus, type II (HCC) 11/23/2017   Diabetic retinopathy (HCC) 07/05/2017   Uncontrolled type 2 diabetes mellitus with hyperglycemia, with long-term current use of insulin (HCC) 04/20/2016   Mixed hyperlipidemia    Essential hypertension, benign     Orientation RESPIRATION BLADDER Height & Weight     Self, Time, Situation, Place  Normal Continent Weight: 150 lb 2.1 oz (68.1 kg) Height:  6\' 3"  (190.5 cm)  BEHAVIORAL SYMPTOMS/MOOD NEUROLOGICAL BOWEL NUTRITION STATUS    Convulsions/Seizures (Hx of seizures) Incontinent Diet (Regular)  AMBULATORY STATUS COMMUNICATION OF NEEDS Skin   Extensive Assist Verbally Surgical wounds, Other (Comment) (Wound / Incision (Open or Dehisced) 05/09/23 Amputation Knee Proximal;Right AKA; Chest Tube 1 Lateral;Right Pleural 14 Fr.)  Personal Care Assistance Level of Assistance  Bathing, Feeding, Dressing Bathing Assistance: Maximum assistance Feeding assistance: Independent Dressing Assistance: Limited assistance     Functional Limitations Info  Sight, Hearing, Speech Sight Info: Impaired Hearing Info:  Adequate Speech Info: Adequate    SPECIAL CARE FACTORS FREQUENCY  PT (By licensed PT), OT (By licensed OT)     PT Frequency: 5x OT Frequency: 5x            Contractures Contractures Info: Not present    Additional Factors Info  Code Status, Allergies Code Status Info: Full code Allergies Info: Promethazine   Insulin Sliding Scale Info: Please see dc summary Isolation Precautions Info: Enteric Precautions      Discharge Medications:  Medication List       STOP taking these medications     docusate sodium 100 MG capsule Commonly known as: Colace    doxycycline 100 MG tablet Commonly known as: ADOXA    midodrine 5 MG tablet Commonly known as: PROAMATINE    vancomycin 125 MG capsule Commonly known as: VANCOCIN           TAKE these medications     (feeding supplement) PROSource Plus liquid Take 30 mLs by mouth 3 (three) times daily between meals.    nutrition supplement (JUVEN) Pack Take 1 packet by mouth 2 (two) times daily between meals.    acetaminophen 325 MG tablet Commonly known as: TYLENOL Take 1-2 tablets (325-650 mg total) by mouth every 4 (four) hours as needed for mild pain.    amiodarone 200 MG tablet Commonly known as: PACERONE Take 1 tablet (200 mg total) by mouth every morning.    ascorbic acid 500 MG tablet Commonly known as: VITAMIN C Take 1,000 mg by mouth daily.    aspirin EC 81 MG tablet Take 1 tablet (81 mg total) by mouth daily. Swallow whole.    atorvastatin 80 MG tablet Commonly known as: LIPITOR TAKE ONE TABLET BY MOUTH AT BEDTIME.    calcitRIOL 0.25 MCG capsule Commonly known as: ROCALTROL Take 9 capsules (2.25 mcg total) by mouth every Tuesday, Thursday, and Saturday at 6 PM.    calcium acetate 667 MG capsule Commonly known as: PHOSLO Take 2 capsules (1,334 mg total) by mouth 3 (three) times daily with meals.    carvedilol 12.5 MG tablet Commonly known as: COREG Take 1 tablet (12.5 mg total) by mouth 2 (two)  times daily.    cinacalcet 30 MG tablet Commonly known as: SENSIPAR Take 2 tablets (60 mg total) by mouth every Tuesday, Thursday, and Saturday at 6 PM.    dorzolamide-timolol 2-0.5 % ophthalmic solution Commonly known as: COSOPT Place 1 drop into the right eye 2 (two) times daily.    levETIRAcetam 750 MG tablet Commonly known as: KEPPRA Take 1 tablet (750 mg total) by mouth 3 (three) times a week. On Tuesday Thursday and Saturday, on dialysis days    levETIRAcetam 500 MG tablet Commonly known as: KEPPRA Take 1 tablet (500 mg total) by mouth 4 (four) times a week. On Sunday, Monday, Wednesday and Friday, all non-dialysis days    lidocaine-prilocaine cream Commonly known as: EMLA Apply 1 Application topically 3 (three) times a week.    loperamide 2 MG capsule Commonly known as: IMODIUM Take 1 capsule (2 mg total) by mouth as needed for diarrhea or loose stools.    multivitamin Tabs tablet Take 1 tablet by mouth at bedtime.    ondansetron 4 MG tablet Commonly known as: ZOFRAN Take  1 tablet (4 mg total) by mouth every 6 (six) hours as needed for nausea or vomiting.    oxyCODONE 5 MG immediate release tablet Commonly known as: Oxy IR/ROXICODONE Take 1 tablet (5 mg total) by mouth every 4 (four) hours as needed for moderate pain (pain score 4-6).    polyethylene glycol 17 g packet Commonly known as: MIRALAX / GLYCOLAX Take 17 g by mouth daily.    zinc sulfate (50mg  elemental zinc) 220 (50 Zn) MG capsule Take 220 mg by mouth daily.       Relevant Imaging Results:  Relevant Lab Results:   Additional Information SSN [id number]  //  [facility] in [facility] [place], [place]

## 2023-05-17 NOTE — Progress Notes (Signed)
 DISCHARGE NOTE [facility]. to be discharged home per MD order. Patient verbalized understanding.  Skin clean, dry and intact without evidence of skin break down, no evidence of skin tears noted. IV catheters discontinued, removed intact. Site without signs and symptoms of complications. Dressing and pressure applied. Pt denies pain at the site currently. No complaints noted.  Patient free of lines, drains, and wounds.   Discharge packet assembled. An After Visit Summary (AVS) was printed and given to the EMS personnel. Patient escorted via wheelchair and discharged home with family. All questions and concerns addressed.   Denton Meek, RN

## 2023-05-17 NOTE — Discharge Summary (Signed)
 Physician Discharge Summary  [name]. WUJ:[id number] DOB: 1970-04-08 DOA: 05/05/2023  PCP: [name], FNP  Admit date: 05/05/2023 Discharge date: 05/17/2023  Admitted From: Home Discharge disposition: Home with home health  Recommendations at discharge:  Ensure compliance with medications and dialysis schedule Follow-up with [name] as an outpatient    Brief narrative: K[place] [name]. is a 53 y.o. male, resident of [facility] with PMH significant for ESRD HD TTS, recent right BKA ([name]), DM2, CAD, CVA, atrial fibrillation not on anticoagulation and seizure disorder. 3/13, patient presented to the ED at [facility] with seizure-like activity which occurred during dialysis.   Workup in the ED showed of large right pleural effusion.  Additionally, thinning of skin of his right BKA amputation stump was noted with a small area of focal ulceration.  The decision was made to transfer him to [facility] where he could be seen by [name] for possible revision of his right BKA stump.   PCCM was consulted to consider a possible thoracentesis.  Subjective: Patient was seen and examined this morning. Lying down in bed.  Not in distress Family not at bedside  Hospital course: Large right pleural effusion  3/13, thoracentesis in IR yielded 1.5 L of hazy amber fluid - felt to likely represent a parapneumonic effusion due to chronic aspiration, versus possible trauma related with history of recent falls per patient -empiric Unasyn was initiated  Chest x-ray postthoracentesis continue to show effusion. CT chest was obtained which showed a very large persisting right effusion 3/14, pigtail chest tube placed by pulmonary with improved drainage 3/15, lytics infused per chest tube -pleural effusion essentially resolved with follow-up chest x-ray noting no significant persisting effusion 3/17, chest tube was removed at bedside Cytology of pleural fluid did not show evidence of  malignant cells   Right AKA stump wound dehiscence Seen by [name]. 3/19, underwent right AKA revision. 3/22, patient complained of pain at the stump site.  Nurses saw swelling, release suture and noted a large amount of blood.  [name] was notified.  Dressing applied.  Outpatient follow-up with [name]   Seizure disorder suspected breakthrough seizure Neurology suggested continuing Keppra 500 mg daily with additional 250 mg on dialysis days after dialysis - clonazepam was used at 0.5 mg twice daily for 3 days as a bridge while underlying infection was being corrected   ESRD HD TTS On schedule per nephrology.   Acute on chronic anemia  anemia of CKD Hemoglobin low mostly between 7 and 8.  Received 3 units of PRBC transfusion this hospitalization.   Recent Labs    02/19/23 0259 02/21/23 0257 03/26/23 0526 03/29/23 1211 05/11/23 0820 05/12/23 1418 05/14/23 0831 05/15/23 0613 05/16/23 1229  HGB 8.0*   < > 7.8*   < > 8.5* 7.4* 6.9* 7.1* 7.3*  MCV 76.8*   < > 75.5*   < > 73.2* 74.0* 72.9* 73.6* 75.2*  VITAMINB12  --   --  501  --   --   --   --   --   --   FOLATE  --   --  6.1  --   --   --   --   --   --   FERRITIN  --   --  820*  --   --   --   --   --   --   TIBC 134*  --  141*  --   --   --   --   --   --  IRON 34*  --  43*  --   --   --   --   --   --   RETICCTPCT  --   --  1.1  --   --   --   --   --   --    < > = values in this interval not displayed.   Chronic loose stools Rectal tube removed on 3/20. Improving.  Currently on imodium nightly only   Chronic diastolic CHF CHF Recent echo with EF 55-60% Blood pressure and volume management per dialysis -no gross overload presently Currently on carvedilol 12.5 mg twice daily Midodrine on hold.  Blood pressure stable   CAD S/p DES to LAD 2022 HLD Currently no anginal symptoms  Continue carvedilol, aspirin, statin    Chronic atrial fibrillation Rate controlled on amiodarone.   Not on anticoagulation-presumably  due to anemia  Type 2 diabetes mellitus HypOglycemia A1c 5 on 05/05/2023 PTA meds-none While n.p.o. for procedure, blood sugar level was low.  After he is allowed to eat, blood sugar level stable. Recent Labs  Lab 05/11/23 1656 05/11/23 2036 05/13/23 0820 05/13/23 2110 05/16/23 1126  GLUCAP 127* 131* 96 126* 104*   Impaired mobility SNF was recommended but the adventures.  Home health PT arranged.  Goals of care   Code Status: Full Code   Consultants: Nephrology, orthopedics  Diet:  Diet Order             Diet general           Diet renal with fluid restriction Fluid restriction: 1200 mL Fluid; Room service appropriate? Yes; Fluid consistency: Thin  Diet effective now                   Nutritional status:  Body mass index is 18.77 kg/m.       Wounds:  - Pressure Injury 02/10/23 Sacrum Right;Upper;Mid Stage 2 -  Partial thickness loss of dermis presenting as a shallow open injury with a red, pink wound bed without slough. (Active)  Date First Assessed: 02/10/23   Location: Sacrum  Location Orientation: Right;Upper;Mid  Staging: Stage 2 -  Partial thickness loss of dermis presenting as a shallow open injury with a red, pink wound bed without slough.    Assessments 02/10/2023 10:00 AM 05/16/2023 10:00 PM  Dressing Type Foam - Lift dressing to assess site every shift Moisture barrier  Dressing Changed Clean, Dry, Intact  Site / Wound Assessment Painful;Pink;Red --  Treatment Cleansed --     No associated orders.     Pressure Injury 04/13/23 Heel Left Unstageable - Full thickness tissue loss in which the base of the injury is covered by slough (yellow, tan, gray, green or brown) and/or eschar (tan, brown or black) in the wound bed. (Active)  Date First Assessed/Time First Assessed: 04/13/23 1148   Location: Heel  Location Orientation: Left  Staging: Unstageable - Full thickness tissue loss in which the base of the injury is covered by slough (yellow, tan, gray,  green or brown) and/or esch...    Assessments 04/13/2023 11:49 AM 05/16/2023 10:00 PM  Dressing Type -- Gauze (Comment)  Dressing -- Clean, Dry, Intact  Dressing Change Frequency -- PRN  Wound Length (cm) 4 cm --  Wound Width (cm) 7 cm --  Wound Depth (cm) 0.1 cm --  Wound Surface Area (cm^2) 28 cm^2 --  Wound Volume (cm^3) 2.8 cm^3 --     No associated orders.     Incision (Closed)  05/11/23 Leg Right (Active)  Date First Assessed/Time First Assessed: 05/11/23 1413   Location: Leg  Location Orientation: Right    Assessments 05/11/2023  2:34 PM 05/16/2023 10:00 PM  Dressing Type Compression wrap;Gauze (Comment) Gauze (Comment)  Dressing Clean, Dry, Intact --  Site / Wound Assessment Clean;Dry;Dressing in place / Unable to assess --  Drainage Amount None --     No associated orders.    Discharge Exam:   Vitals:   05/17/23 0509 05/17/23 0511 05/17/23 0754 05/17/23 0932  BP: (!) 190/67 (!) 184/63 (!) 156/71   Pulse: 60 (!) 59 60 60  Resp: 15     Temp: 98.1 F (36.7 C)  98.2 F (36.8 C)   TempSrc: Oral  Oral   SpO2: 94% 100% 99%   Weight:      Height:        Body mass index is 18.77 kg/m.  General exam: Pleasant, middle-aged African-American male.  Skin: No rashes, lesions or ulcers. HEENT: Atraumatic, normocephalic, no obvious bleeding Lungs: Clear to auscultation bilaterally,  CVS: S1, S2, no murmur,   GI/Abd: Soft, nontender, nondistended, bowel sound present,   CNS: Alert, awake, slow to respond.  Oriented x 3 Psychiatry: Mood appropriate,  Extremities: No pedal edema, no calf tenderness.  Right AKA stump has dressing applied  Follow ups:    Follow-up Information     [name], MD Follow up in 1 week(s).   Specialty: Orthopedic Surgery Contact information: [address] [place] [address] [phone]         [name], FNP Follow up.   Specialty: Nurse Practitioner Contact information: [address] [place]  [address] [phone]                 Discharge Instructions:   Discharge Instructions     Call MD for:  difficulty breathing, headache or visual disturbances   Complete by: As directed    Call MD for:  extreme fatigue   Complete by: As directed    Call MD for:  hives   Complete by: As directed    Call MD for:  persistant dizziness or light-headedness   Complete by: As directed    Call MD for:  persistant nausea and vomiting   Complete by: As directed    Call MD for:  severe uncontrolled pain   Complete by: As directed    Call MD for:  temperature >100.4   Complete by: As directed    Diet general   Complete by: As directed    Renal diet   Discharge instructions   Complete by: As directed    Recommendations at discharge:   Ensure compliance with medications and dialysis schedule  Follow-up with [name] as an outpatient  PDMP reviewed this encounter.   Opioid taper instructions: It is important to wean off of your opioid medication as soon as possible. If you do not need pain medication after your surgery it is ok to stop day one. Opioids include: Codeine, Hydrocodone(Norco, Vicodin), Oxycodone(Percocet, oxycontin) and hydromorphone amongst others.  Long term and even short term use of opiods can cause: Increased pain response Dependence Constipation Depression Respiratory depression And more.  Withdrawal symptoms can include Flu like symptoms Nausea, vomiting And more Techniques to manage these symptoms Hydrate well Eat regular healthy meals Stay active Use relaxation techniques(deep breathing, meditating, yoga) Do Not substitute Alcohol to help with tapering If you have been on opioids for less than two weeks  and do not have pain than it is ok to stop all together.  Plan to wean off of opioids This plan should start within one week post op of your joint replacement. Maintain the same interval or time between taking each dose and first decrease the  dose.  Cut the total daily intake of opioids by one tablet each day Next start to increase the time between doses. The last dose that should be eliminated is the evening dose.        General discharge instructions: Follow with Primary MD [name], FNP in 7 days  Please request your PCP  to go over your hospital tests, procedures, radiology results at the follow up. Please get your medicines reviewed and adjusted.  Your PCP may decide to repeat certain labs or tests as needed. Do not drive, operate heavy machinery, perform activities at heights, swimming or participation in water activities or provide baby sitting services if your were admitted for syncope or siezures until you have seen by Primary MD or a Neurologist and advised to do so again. [place] Controlled Substance Reporting System database was reviewed. Do not drive, operate heavy machinery, perform activities at heights, swim, participate in water activities or provide baby-sitting services while on medications for pain, sleep and mood until your outpatient physician has reevaluated you and advised to do so again.  You are strongly recommended to comply with the dose, frequency and duration of prescribed medications. Activity: As tolerated with Full fall precautions use walker/cane & assistance as needed Avoid using any recreational substances like cigarette, tobacco, alcohol, or non-prescribed drug. If you experience worsening of your admission symptoms, develop shortness of breath, life threatening emergency, suicidal or homicidal thoughts you must seek medical attention immediately by calling 911 or calling your MD immediately  if symptoms less severe. You must read complete instructions/literature along with all the possible adverse reactions/side effects for all the medicines you take and that have been prescribed to you. Take any new medicine only after you have completely understood and accepted all the possible  adverse reactions/side effects.  Wear Seat belts while driving. You were cared for by a hospitalist during your hospital stay. If you have any questions about your discharge medications or the care you received while you were in the hospital after you are discharged, you can call the unit and ask to speak with the hospitalist or the covering physician. Once you are discharged, your primary care physician will handle any further medical issues. Please note that NO REFILLS for any discharge medications will be authorized once you are discharged, as it is imperative that you return to your primary care physician (or establish a relationship with a primary care physician if you do not have one).   Discharge wound care:   Complete by: As directed    Increase activity slowly   Complete by: As directed        Discharge Medications:   Allergies as of 05/17/2023       Reactions   Promethazine Diarrhea, Other (See Comments)   Muscle cramps, cramping "all over"        Medication List     STOP taking these medications    docusate sodium 100 MG capsule Commonly known as: Colace   doxycycline 100 MG tablet Commonly known as: ADOXA   midodrine 5 MG tablet Commonly known as: PROAMATINE   vancomycin 125 MG capsule Commonly known as: VANCOCIN       TAKE these medications    (  feeding supplement) PROSource Plus liquid Take 30 mLs by mouth 3 (three) times daily between meals.   nutrition supplement (JUVEN) Pack Take 1 packet by mouth 2 (two) times daily between meals.   acetaminophen 325 MG tablet Commonly known as: TYLENOL Take 1-2 tablets (325-650 mg total) by mouth every 4 (four) hours as needed for mild pain.   amiodarone 200 MG tablet Commonly known as: PACERONE Take 1 tablet (200 mg total) by mouth every morning.   ascorbic acid 500 MG tablet Commonly known as: VITAMIN C Take 1,000 mg by mouth daily.   aspirin EC 81 MG tablet Take 1 tablet (81 mg total) by mouth daily.  Swallow whole.   atorvastatin 80 MG tablet Commonly known as: LIPITOR TAKE ONE TABLET BY MOUTH AT BEDTIME.   calcitRIOL 0.25 MCG capsule Commonly known as: ROCALTROL Take 9 capsules (2.25 mcg total) by mouth every Tuesday, Thursday, and Saturday at 6 PM.   calcium acetate 667 MG capsule Commonly known as: PHOSLO Take 2 capsules (1,334 mg total) by mouth 3 (three) times daily with meals.   carvedilol 12.5 MG tablet Commonly known as: COREG Take 1 tablet (12.5 mg total) by mouth 2 (two) times daily.   cinacalcet 30 MG tablet Commonly known as: SENSIPAR Take 2 tablets (60 mg total) by mouth every Tuesday, Thursday, and Saturday at 6 PM.   dorzolamide-timolol 2-0.5 % ophthalmic solution Commonly known as: COSOPT Place 1 drop into the right eye 2 (two) times daily.   levETIRAcetam 750 MG tablet Commonly known as: KEPPRA Take 1 tablet (750 mg total) by mouth 3 (three) times a week. On Tuesday Thursday and Saturday, on dialysis days   levETIRAcetam 500 MG tablet Commonly known as: KEPPRA Take 1 tablet (500 mg total) by mouth 4 (four) times a week. On Sunday, Monday, Wednesday and Friday, all non-dialysis days   lidocaine-prilocaine cream Commonly known as: EMLA Apply 1 Application topically 3 (three) times a week.   loperamide 2 MG capsule Commonly known as: IMODIUM Take 1 capsule (2 mg total) by mouth as needed for diarrhea or loose stools.   multivitamin Tabs tablet Take 1 tablet by mouth at bedtime.   ondansetron 4 MG tablet Commonly known as: ZOFRAN Take 1 tablet (4 mg total) by mouth every 6 (six) hours as needed for nausea or vomiting.   oxyCODONE 5 MG immediate release tablet Commonly known as: Oxy IR/ROXICODONE Take 1 tablet (5 mg total) by mouth every 4 (four) hours as needed for moderate pain (pain score 4-6).   polyethylene glycol 17 g packet Commonly known as: MIRALAX / GLYCOLAX Take 17 g by mouth daily.   zinc sulfate (50mg  elemental zinc) 220 (50 Zn) MG  capsule Take 220 mg by mouth daily.               Durable Medical Equipment  (From admission, onward)           Start     Ordered   05/16/23 1417  For home use only DME standard manual wheelchair with seat cushion  Once       Comments: Patient suffers from right AKA status which impairs their ability to perform daily activities like bathing, dressing, feeding, grooming, and toileting in the home.  A cane, crutch, or walker will not resolve issue with performing activities of daily living. A wheelchair will allow patient to safely perform daily activities. Patient can safely propel the wheelchair in the home or has a caregiver who can provide assistance.  Length of need Lifetime. Accessories: elevating leg rests (ELRs), wheel locks, extensions and anti-tippers.   05/16/23 1417   05/16/23 1417  For home use only DME Other see comment  Once       Comments: Transfer board  Question:  Length of Need  Answer:  Lifetime   05/16/23 1417   05/16/23 1342  For home use only DME Hospital bed  Once       Question Answer Comment  Length of Need Lifetime   Patient has (list medical condition): ESRD on HD, DM 2, right AKA status, impaired mobility   Bed type Semi-electric   Hoyer Lift Yes   Support Surface: Gel Overlay      05/16/23 1342              Discharge Care Instructions  (From admission, onward)           Start     Ordered   05/17/23 0000  Discharge wound care:        05/17/23 1304             The results of significant diagnostics from this hospitalization (including imaging, microbiology, ancillary and laboratory) are listed below for reference.    Procedures and Diagnostic Studies:   DG Chest Port 1 View Result Date: 05/06/2023 CLINICAL DATA:  Right chest tube, history of pleural effusion EXAM: PORTABLE CHEST 1 VIEW COMPARISON:  05/05/2023 FINDINGS: Single frontal view of the chest demonstrates near complete resolution of the right pleural effusion after  placement of a right-sided pigtail drainage catheter, coiled over the right lung base. There is minimal residual consolidation at the right lung base, which may reflect atelectasis or pneumonia. Left chest is clear. No pneumothorax. Cardiac silhouette is enlarged but stable. No acute bony abnormalities. IMPRESSION: 1. Near complete resolution of right pleural effusion after pigtail drainage catheter placement. 2. Minimal residual consolidation at the right lung base which may reflect atelectasis or pneumonia. 3. Stable enlarged cardiac silhouette. Electronically Signed   By: Sharlet Salina M.D.   On: 05/06/2023 16:24   CT CHEST WO CONTRAST Result Date: 05/05/2023 CLINICAL DATA:  Pneumonia, complication  suspected EXAM: CT CHEST WITHOUT CONTRAST TECHNIQUE: Multidetector CT imaging of the chest was performed following the standard protocol without IV contrast. RADIATION DOSE REDUCTION: This exam was performed according to the departmental dose-optimization program which includes automated exposure control, adjustment of the mA and/or kV according to patient size and/or use of iterative reconstruction technique. COMPARISON:  11/21/2021 FINDINGS: Cardiovascular: Mild four-chamber cardiac enlargement. Small pericardial effusion, decreased from previous. Heavy mitral annulus calcifications. Extensive 3 vessel coronary calcifications. Aortic Atherosclerosis (ICD10-170.0). Mediastinum/Nodes: No definite mass or adenopathy. Lungs/Pleura: Moderately large right pleural effusion has developed. Extensive consolidation/atelectasis in the right lower lobe, and posterior right upper lobe. No pneumothorax. Minimal dependent atelectasis posteriorly at the left lung base. Upper Abdomen: Extensive vascular calcifications. No acute findings. Musculoskeletal: No chest wall mass or suspicious bone lesions identified. IMPRESSION: 1. Moderately large right pleural effusion with extensive right lower lobe and posterior right upper lobe  consolidation/atelectasis. 2. Small pericardial effusion, decreased from previous. 3. Coronary and aortic Atherosclerosis (ICD10-I70.0). Electronically Signed   By: Corlis Leak M.D.   On: 05/05/2023 21:39   DG Chest 1 View Result Date: 05/05/2023 CLINICAL DATA:  Status post thoracentesis EXAM: CHEST  1 VIEW COMPARISON:  Chest x-ray 05/05/2023 FINDINGS: A very large right pleural effusion persists and has not significantly changed. The left lung is clear. There is no evidence  for pneumothorax. The heart is enlarged, unchanged. No acute fractures are seen. IMPRESSION: Stable large right pleural effusion. No evidence for pneumothorax. Electronically Signed   By: Darliss Cheney M.D.   On: 05/05/2023 17:39   IR THORACENTESIS ASP PLEURAL SPACE W/IMG GUIDE Result Date: 05/05/2023 INDICATION: 53 year old male with history of ESRD presents with shortness of breath. Previous imaging showed right pleural effusion. Request for therapeutic and diagnostic thoracentesis. EXAM: ULTRASOUND GUIDED RIGHT THORACENTESIS MEDICATIONS: Ancef 3 gm IV COMPLICATIONS: None immediate. PROCEDURE: An ultrasound guided thoracentesis was thoroughly discussed with the patient and questions answered. The benefits, risks, alternatives and complications were also discussed. The patient understands and wishes to proceed with the procedure. Written consent was obtained. Ultrasound was performed to localize and mark an adequate pocket of fluid in the right chest. The area was then prepped and draped in the normal sterile fashion. 1% Lidocaine was used for local anesthesia. Under ultrasound guidance a 6 Fr Safe-T-Centesis catheter was introduced. Thoracentesis was performed. The catheter was removed and a dressing applied. FINDINGS: A total of approximately 1.5 L of hazy amber fluid was removed. Samples were sent to the laboratory as requested by the clinical team. Post-procedure chest x-ray ordered, results pending. IMPRESSION: Successful ultrasound  guided right thoracentesis yielding 1.5 L of pleural fluid. Performed by: Lawernce Ion, PA-C Electronically Signed   By: Gilmer Mor D.O.   On: 05/05/2023 16:00   DG Knee 1-2 Views Right Result Date: 05/05/2023 CLINICAL DATA:  [id number] Pain 144615. Soreness in the region of the amputation. EXAM: RIGHT KNEE - 1-2 VIEW COMPARISON:  None Available. FINDINGS: Status post right above-knee amputation. The surgical margin is sharp. No focal bony erosions. There is thinning of the overlying stump especially on the cross-table view with probable small area of focal ulcer. Correlate with physical examination. No evidence of air within the stump. No acute fracture. No radiopaque foreign bodies. IMPRESSION: *Status post right above-knee amputation. No focal bony erosions. Thinning of the overlying stump with probable small area of focal ulcer. Correlate with physical examination. Electronically Signed   By: Jules Schick M.D.   On: 05/05/2023 10:36   DG Chest Portable 1 View Result Date: 05/05/2023 CLINICAL DATA:  Shortness of breath. EXAM: PORTABLE CHEST 1 VIEW COMPARISON:  10/08/2022. FINDINGS: There is large right pleural effusion causing shift of mediastinal structures to the left. Mild aeration of the right upper lung zone noted. Left lung and left lateral costophrenic angle are clear. Evaluation of cardiomediastinal silhouette is nondiagnostic due to right lower hemithorax opacification. No acute osseous abnormalities. The soft tissues are within normal limits. IMPRESSION: *Large right pleural effusion causing shift of mediastinal structures to the left. Electronically Signed   By: Jules Schick M.D.   On: 05/05/2023 08:14     Labs:   Basic Metabolic Panel: Recent Labs  Lab 05/11/23 0820 05/12/23 1418 05/13/23 1105 05/15/23 0613  NA 140 138 138 137  K 5.6* 5.5* 5.2* 5.4*  CL 102 100 98 98  CO2 23 24 25 24   GLUCOSE 77 80 115* 109*  BUN 60* 79* 65* 89*  CREATININE 9.93* 11.63* 9.00* 11.51*  CALCIUM  8.4* 8.9 8.6* 8.5*  PHOS 7.5* 8.1*  --   --    GFR Estimated Creatinine Clearance: 7 mL/min (A) (by C-G formula based on SCr of 11.51 mg/dL (H)). Liver Function Tests: Recent Labs  Lab 05/11/23 0820 05/12/23 1418  ALBUMIN 1.9* 1.9*   No results for input(s): "LIPASE", "AMYLASE" in the last 168  hours. No results for input(s): "AMMONIA" in the last 168 hours. Coagulation profile No results for input(s): "INR", "PROTIME" in the last 168 hours.  CBC: Recent Labs  Lab 05/11/23 0820 05/12/23 1418 05/14/23 0831 05/15/23 0613 05/16/23 1229  WBC 9.6 12.9* 11.3* 11.1* 10.0  NEUTROABS 5.8 9.4*  --  7.3 6.3  HGB 8.5* 7.4* 6.9* 7.1* 7.3*  HCT 27.8* 24.7* 22.6* 23.1* 23.9*  MCV 73.2* 74.0* 72.9* 73.6* 75.2*  PLT 240 210 185 204 216   Cardiac Enzymes: No results for input(s): "CKTOTAL", "CKMB", "CKMBINDEX", "TROPONINI" in the last 168 hours. BNP: Invalid input(s): "POCBNP" CBG: Recent Labs  Lab 05/11/23 1656 05/11/23 2036 05/13/23 0820 05/13/23 2110 05/16/23 1126  GLUCAP 127* 131* 96 126* 104*   D-Dimer No results for input(s): "DDIMER" in the last 72 hours. Hgb A1c No results for input(s): "HGBA1C" in the last 72 hours. Lipid Profile No results for input(s): "CHOL", "HDL", "LDLCALC", "TRIG", "CHOLHDL", "LDLDIRECT" in the last 72 hours. Thyroid function studies No results for input(s): "TSH", "T4TOTAL", "T3FREE", "THYROIDAB" in the last 72 hours.  Invalid input(s): "FREET3" Anemia work up No results for input(s): "VITAMINB12", "FOLATE", "FERRITIN", "TIBC", "IRON", "RETICCTPCT" in the last 72 hours. Microbiology Recent Results (from the past 240 hours)  Surgical PCR screen     Status: None   Collection Time: 05/09/23 11:02 AM   Specimen: Nasal Mucosa; Nasal Swab  Result Value Ref Range Status   MRSA, PCR NEGATIVE NEGATIVE Final   Staphylococcus aureus NEGATIVE NEGATIVE Final    Comment: (NOTE) The Xpert SA Assay (FDA approved for NASAL specimens in patients  49 years of age and older), is one component of a comprehensive surveillance program. It is not intended to diagnose infection nor to guide or monitor treatment. Performed at [facility], [address], [place], [place] [address]     Time coordinating discharge: 45 minutes  Signed: Bastian Andreoli  [facility] 05/17/2023, 1:04 PM

## 2023-05-17 NOTE — Progress Notes (Signed)
   05/17/23 1916  Vitals  Pulse Rate 65  Resp (!) 8  BP 126/65  SpO2 96 %  O2 Device Room Air  Oxygen Therapy  Patient Activity (if Appropriate) In bed  Pulse Oximetry Type Continuous  Oximetry Probe Site Changed No  During Treatment Monitoring  Blood Flow Rate (mL/min) 0 mL/min  Arterial Pressure (mmHg) -4.44 mmHg  Venous Pressure (mmHg) 17.17 mmHg  TMP (mmHg) 26.26 mmHg  Ultrafiltration Rate (mL/min) 659 mL/min  Dialysate Flow Rate (mL/min) 299 ml/min  Dialysate Potassium Concentration 2  Dialysate Calcium Concentration 2.5  Duration of HD Treatment -hour(s) 3 hour(s)  Cumulative Fluid Removed (mL) per Treatment  1000.08  HD Safety Checks Performed Yes  Intra-Hemodialysis Comments Tx completed   Received patient in bed to unit.  Alert and oriented.  Informed consent signed and in chart.   TX duration: 3 hours   Patient tolerated well.  Transported back to the room  Alert, without acute distress.  Hand-off given to patient's nurse.   Access used: LAVF Access issues: None  Total UF removed: 1000 Medication(s) given: None   Mar Daring, LPN  [facility]

## 2023-05-17 NOTE — Progress Notes (Signed)
 D/C order noted. Case discussed with RN CM. Plan at this time is for pt to return home with wife. Contacted [facility]  to advise clinic that pt will d/c to home later today and should resume care on Thursday. SNF was denied by insurance. D/C summary and today's renal note faxed to clinic for continuation of care.  Olivia Canter Renal Navigator [phone]

## 2023-05-17 NOTE — TOC Transition Note (Signed)
 Transition of Care [facility]) - Discharge Note   Patient Details  Name: [name]. MRN: [id number] Date of Birth: [birth year]  Transition of [facility] CM/SW Contact:  Tom-Johnson, Hershal Coria, RN Phone Number: 05/17/2023, 3:49 PM   Clinical Narrative:     Patient is scheduled for discharge today.  Readmission Risk Assessment done. Home health info, Outpatient f/u, hospital f/u and discharge instructions on AVS. Patient voiced concerns about going home instead of SNF. CM explained to patient that a P2P was also done and that was denied.  CM notified patient's wife [name] and she states she will not appeal and if patient wants to appeal, he can do it himself.  CM and RN assisted patient in dialing the [employer] number [phone]) and each time patient states he was transferred and then hung up on. Patient decides to return home with home health and work with home health SW to continue with placement.  Wife, [name] to transport after inpatient HD.  No further TOC needs noted.          Final next level of care: Home w Home Health Services Barriers to Discharge: Barriers Resolved   Patient Goals and CMS Choice Patient states their goals for this hospitalization and ongoing recovery are:: To return home CMS Medicare.gov Compare Post Acute Care list provided to:: Patient Choice offered to / list presented to : Patient, Spouse      Discharge Placement                Patient to be transferred to facility by: Wife Name of family member notified: [name]    Discharge Plan and Services Additional resources added to the After Visit Summary for   In-house Referral: Clinical Social Work   Post Acute Care Choice: Skilled Nursing Facility          DME Arranged: Hospital bed, Wheelchair manual (Transfer/sliding board, Nurse, adult) DME Agency: [employer] Date DME Agency Contacted: 05/16/23 Time DME Agency Contacted: 1420 Representative spoke with at DME  Agency: Earna Coder HH Arranged: PT, OT, RN, Social Work [employer] Agency: [employer] [facility] Date [facility] Agency Contacted: 05/17/23 Time HH Agency Contacted: 1047 Representative spoke with at [employer] Agency: Becky Sax  Social Drivers of Health (SDOH) Interventions SDOH Screenings   Food Insecurity: No Food Insecurity (05/05/2023)  Housing: High Risk (05/05/2023)  Transportation Needs: No Transportation Needs (05/05/2023)  Utilities: Not At Risk (05/05/2023)  Financial Resource Strain: High Risk (07/05/2017)  Physical Activity: Inactive (07/05/2017)  Social Connections: Socially Integrated (05/05/2023)  Stress: Stress Concern Present (07/05/2017)  Tobacco Use: Low Risk  (05/11/2023)  Health Literacy: Low Risk  (06/01/2020)   Received from [facility], [facility]     Readmission Risk Interventions    05/17/2023    3:44 PM 07/27/2021   11:49 AM 11/27/2020    3:06 PM  Readmission Risk Prevention Plan  Transportation Screening Complete Complete Complete  PCP or Specialist Appt within 3-5 Days   Complete  HRI or Home Care Consult   Complete  Social Work Consult for Recovery Care Planning/Counseling   Complete  Palliative Care Screening   Not Applicable  Medication Review [employer]) Referral to Pharmacy Complete Complete  PCP or Specialist appointment within 3-5 days of discharge Complete    HRI or Home Care Consult Complete    SW Recovery Care/Counseling Consult Complete Complete   Palliative Care Screening Not Applicable Not Applicable   Skilled Nursing Facility Not Applicable Not Applicable

## 2023-05-17 NOTE — Progress Notes (Signed)
 Physical Therapy Treatment Patient Details Name: [name]. MRN: [id number] DOB: September 05, 1970 Today's Date: 05/17/2023   History of Present Illness 53 year old admitted 3/13 found to have large right pleural effusion, recent right AKA on 04/06/2023 with new stump ulceration, suspected breakthrough seizure. Chest tube placed (right) 3/14 and subsequently removed.  Now s/p AKA revision on 05/11/23. PMHx: ESRD on HD TTS, recent right BKA ([name]), CVA, atrial fibrillation not on anticoagulation, DM2, and seizure disorder.    PT Comments  Session today focused on scooting since pt was unable to stand with assist yesterday. At first attempted low pivot to [facility] but pt unable to take full wt through LLE and was limited by L shoulder discomfort from old RTC injury. Pt will need a drop arm BSC to scoot to [facility] and none available on this unit. Pt then practiced scooting laterally to simulate move to [facility]. Needs max A to scoot R and L. Pt needing max A and often max A +2 for all mobility at this time, would not recommend return home without 24 hr assist. However, if continued rehab <3 hrs/ day is not available, recommend HHPT with hoyer, hospital bed, drop arm BSC, and sliding board. Pt will also need 24 hr assist. PT will continue to follow.     If plan is discharge home, recommend the following: Two people to help with walking and/or transfers;Two people to help with bathing/dressing/bathroom;Direct supervision/assist for medications management;Help with stairs or ramp for entrance;Assist for transportation;Assistance with cooking/housework;Direct supervision/assist for financial management;Supervision due to cognitive status   Can travel by private vehicle     No  Equipment Recommendations  Hospital bed;Hoyer lift;Other (comment);BSC/3in1 (sliding board, drop arm BSC)    Recommendations for Other Services       Precautions / Restrictions Precautions Precautions: Fall Recall of  Precautions/Restrictions: Intact Precaution/Restrictions Comments: R AKA Restrictions Weight Bearing Restrictions Per Provider Order: No LLE Weight Bearing Per Provider Order: Non weight bearing     Mobility  Bed Mobility Overal bed mobility: Needs Assistance Bed Mobility: Supine to Sit, Sit to Supine Rolling: Mod assist Sidelying to sit: Used rails, Mod assist, +2 for physical assistance   Sit to supine: Max assist   General bed mobility comments: pt moving more quickly today, but continues to be unable to scoot hips fwd to EOB, needs mod A for this to be able to get LLE off EOB before sitting up. Mod A +2 for transition from SL to upright sitting. Needed mod A to LLE to return to supine    Transfers Overall transfer level: Needs assistance                Lateral/Scoot Transfers: Max assist General transfer comment: attempted squat pivot to [facility] but pt unable to get over arm of BSC. No drop arm BSC available on unit for him to try. Worked on scooting along EOB to simulate. Needed max A to move 3' at a time. Pt limited by RTC injury LUE    Ambulation/Gait               General Gait Details: unable   Stairs             Wheelchair Mobility     Tilt Bed    Modified Rankin (Stroke Patients Only)       Balance Overall balance assessment: Needs assistance Sitting-balance support: Bilateral upper extremity supported Sitting balance-Leahy Scale: Fair Sitting balance - Comments: able to maintain balance sitting EOB with supervision  today x30 mins, no physical assist needed. Does lose balance with challenges to R or L though       Standing balance comment: unable                            Communication Communication Communication: No apparent difficulties  Cognition Arousal: Alert Behavior During Therapy: Flat affect   PT - Cognitive impairments: Problem solving, Safety/Judgement                       PT - Cognition Comments:  slow processing, some limited safety awareness;  unknown baseline Following commands: Impaired Following commands impaired: Only follows one step commands consistently, Follows one step commands with increased time    Cueing Cueing Techniques: Verbal cues, Tactile cues  Exercises Other Exercises Other Exercises: dynamic reaching sitting EOB Other Exercises: scapular depression in sitting x5    General Comments General comments (skin integrity, edema, etc.): pt able to tolerate pushing through LLE with scooting but unable to tolerate full wt on it      Pertinent Vitals/Pain Pain Assessment Pain Assessment: Faces Faces Pain Scale: Hurts even more Pain Location: R AKA revision Pain Descriptors / Indicators: Aching, Discomfort, Grimacing, Guarding, Sore Pain Intervention(s): Limited activity within patient's tolerance, Monitored during session    Home Living                          Prior Function            PT Goals (current goals can now be found in the care plan section) Acute Rehab PT Goals Patient Stated Goal: Improve strength, independence. PT Goal Formulation: With patient Time For Goal Achievement: 05/26/23 Potential to Achieve Goals: Fair Progress towards PT goals: Progressing toward goals    Frequency    Min 2X/week      PT Plan      Co-evaluation              AM-PAC PT "6 Clicks" Mobility   Outcome Measure  Help needed turning from your back to your side while in a flat bed without using bedrails?: Total Help needed moving from lying on your back to sitting on the side of a flat bed without using bedrails?: Total Help needed moving to and from a bed to a chair (including a wheelchair)?: Total Help needed standing up from a chair using your arms (e.g., wheelchair or bedside chair)?: Total Help needed to walk in hospital room?: Total Help needed climbing 3-5 steps with a railing? : Total 6 Click Score: 6    End of Session   Activity  Tolerance: Patient tolerated treatment well Patient left: in bed;with call bell/phone within reach;with bed alarm set Nurse Communication: Mobility status PT Visit Diagnosis: Other abnormalities of gait and mobility (R26.89);Pain;Muscle weakness (generalized) (M62.81) Pain - Right/Left: Right Pain - part of body: Leg     Time: 1610-9604 PT Time Calculation (min) (ACUTE ONLY): 36 min  Charges:    $Therapeutic Activity: 23-37 mins PT General Charges $$ ACUTE PT VISIT: 1 Visit                     Lyanne Co, PT  Acute Rehab Services Secure chat preferred Office [phone]'   Lawana Chambers Tamir [place]man 05/17/2023, 2:20 PM

## 2023-05-17 NOTE — Progress Notes (Addendum)
 [facility] Progress Note   Subjective:    Seen and examined patient at bedside. Informed his insurance authorization for SNF was denied. Plan now is for him to go home with home health. He expressed to me concerns about going home. Says his wife will not be able to take care of him. Discussed with SW. Plan for HD today.  Objective Vitals:   05/17/23 0509 05/17/23 0511 05/17/23 0754 05/17/23 0932  BP: (!) 190/67 (!) 184/63 (!) 156/71   Pulse: 60 (!) 59 60 60  Resp: 15     Temp: 98.1 F (36.7 C)  98.2 F (36.8 C)   TempSrc: Oral  Oral   SpO2: 94% 100% 99%   Weight:      Height:       Physical Exam General: Alert male in NAD Heart: RRR, no murmur Lungs: Clear; Abdomen: Non-distended Extremities: No LE edema: R AKA  Dialysis Access: AVF +bruit   Filed Weights   05/15/23 0432 05/15/23 1800 05/15/23 2048  Weight: 69.4 kg 69.5 kg 68.1 kg    Intake/Output Summary (Last 24 hours) at 05/17/2023 1221 Last data filed at 05/16/2023 2146 Gross per 24 hour  Intake 123 ml  Output 0 ml  Net 123 ml    Additional Objective Labs: Basic Metabolic Panel: Recent Labs  Lab 05/11/23 0820 05/12/23 1418 05/13/23 1105 05/15/23 0613  NA 140 138 138 137  K 5.6* 5.5* 5.2* 5.4*  CL 102 100 98 98  CO2 23 24 25 24   GLUCOSE 77 80 115* 109*  BUN 60* 79* 65* 89*  CREATININE 9.93* 11.63* 9.00* 11.51*  CALCIUM 8.4* 8.9 8.6* 8.5*  PHOS 7.5* 8.1*  --   --    Liver Function Tests: Recent Labs  Lab 05/11/23 0820 05/12/23 1418  ALBUMIN 1.9* 1.9*   No results for input(s): "LIPASE", "AMYLASE" in the last 168 hours. CBC: Recent Labs  Lab 05/11/23 0820 05/12/23 1418 05/14/23 0831 05/15/23 0613 05/16/23 1229  WBC 9.6 12.9* 11.3* 11.1* 10.0  NEUTROABS 5.8 9.4*  --  7.3 6.3  HGB 8.5* 7.4* 6.9* 7.1* 7.3*  HCT 27.8* 24.7* 22.6* 23.1* 23.9*  MCV 73.2* 74.0* 72.9* 73.6* 75.2*  PLT 240 210 185 204 216   Blood Culture    Component Value Date/Time   SDES FLUID PLEURAL  RIGHT 05/05/2023 1443   SDES FLUID PLEURAL RIGHT 05/05/2023 1443   SPECREQUEST BOTTLES DRAWN AEROBIC AND ANAEROBIC 05/05/2023 1443   SPECREQUEST NONE 05/05/2023 1443   CULT  05/05/2023 1443    NO GROWTH 5 DAYS Performed at [facility], [address] [place], [place] [address]    REPTSTATUS 05/10/2023 FINAL 05/05/2023 1443   REPTSTATUS 05/05/2023 FINAL 05/05/2023 1443    Cardiac Enzymes: No results for input(s): "CKTOTAL", "CKMB", "CKMBINDEX", "TROPONINI" in the last 168 hours. CBG: Recent Labs  Lab 05/11/23 1656 05/11/23 2036 05/13/23 0820 05/13/23 2110 05/16/23 1126  GLUCAP 127* 131* 96 126* 104*   Iron Studies: No results for input(s): "IRON", "TIBC", "TRANSFERRIN", "FERRITIN" in the last 72 hours. Lab Results  Component Value Date   INR 1.2 05/06/2023   INR 1.2 05/05/2023   INR 1.1 03/01/2022   Studies/Results: No results found.  Medications:   sodium chloride   Intravenous Once   amiodarone  100 mg Oral BH-q7a   ascorbic acid  1,000 mg Oral Daily   aspirin EC  81 mg Oral Daily   atorvastatin  80 mg Oral QHS   carvedilol  12.5  mg Oral BID   Chlorhexidine Gluconate Cloth  6 each Topical Q0600   cinacalcet  60 mg Oral Q T,Th,Sat-1800   [START ON 05/19/2023] darbepoetin (ARANESP) injection - DIALYSIS  150 mcg Subcutaneous Q Thu-1800   dorzolamide-timolol  1 drop Right Eye BID   Gerhardt's butt cream   Topical BID   heparin  5,000 Units Subcutaneous Q8H   hydrocortisone  1 Application Rectal BID   insulin aspart  0-6 Units Subcutaneous TID WC   levETIRAcetam  500 mg Oral Once per day on Sunday Monday Wednesday Friday   levETIRAcetam  750 mg Oral Once per day on Tuesday Thursday Saturday   lidocaine-prilocaine  1 Application Topical Once per day on Monday Wednesday Friday   loperamide  2 mg Oral QHS   multivitamin  1 tablet Oral QHS   nutrition supplement (JUVEN)  1 packet Oral BID BM   sodium chloride flush  3 mL Intravenous Q12H   sodium zirconium  cyclosilicate  10 g Oral Once per day on Sunday Monday Wednesday Friday   sucroferric oxyhydroxide  500 mg Oral TID WC   zinc sulfate (50mg  elemental zinc)  220 mg Oral Daily    Dialysis Orders: [facility] TTS. 3 hours 15 minutes. EDW: 77.5 kg (however likely well under this as per outpatient unit). LUE AVF, 15-gauge needles. Flow rates: 400/800. 37 degrees. 2K/2.5 calcium. UF profile #2. Heparin: Loading 1000 units, 400 units/h. Meds: Mircera 100 mcg every 2 weeks (last dose 3/4)   Assessment/Plan: R pleural effusion: S/p thoracentesis with IR on 3/13 Dehiscence R AKA - S/p revision 3/19 by [name].  Hyperkalemia - Not adhering to dialysis Rx, diet. On [place]el[place] on non-HD days ESRD: HD TTS. Unfortunately, he signs off treatment early here. Advised on HD compliance. Next HD today. HTN/volume: BP/volume ok Anemia: Blood loss from stump hematoma. Now resolved. S/p s units PRBCs total since admit (3/13, 3/18, 3/22. Received Aransep 100 on 3/20. Dose raised yesterday. Transfuse prn.  Secondary hyperparathyroidism: Corr Ca 10.08 and phos is trending. Hold Calcitriol for now. Cont Sensipar. Velphoro.  Nutrition:  Alb low, continue protein supplements  Dispo - Authorization for SNF denied. Plan for discharge to home with home health but patient has some concerns with this. Says his wife will not be able to take care of him. Discussed with SW. Currently working on an appeal.  Salome Holmes, NP [facility] 05/17/2023,12:21 PM  LOS: 12 days

## 2023-05-17 NOTE — Progress Notes (Signed)
 Patient was on phone appealing his discharge when entered the room to do discharge,  understand from patient RN that is what was understood from a conversation with Case manager.

## 2023-05-18 ENCOUNTER — Encounter: Payer: Medicare HMO | Admitting: Family

## 2023-05-22 ENCOUNTER — Other Ambulatory Visit: Payer: Self-pay

## 2023-05-22 ENCOUNTER — Emergency Department (HOSPITAL_COMMUNITY)

## 2023-05-22 ENCOUNTER — Observation Stay (HOSPITAL_COMMUNITY)
Admission: EM | Admit: 2023-05-22 | Discharge: 2023-05-26 | Disposition: A | Discharge status: Skilled Nursing Facility | Attending: Family Medicine | Admitting: Family Medicine

## 2023-05-22 ENCOUNTER — Encounter (HOSPITAL_COMMUNITY): Payer: Self-pay | Admitting: *Deleted

## 2023-05-22 DIAGNOSIS — Z89611 Acquired absence of right leg above knee: Secondary | ICD-10-CM | POA: Diagnosis not present

## 2023-05-22 DIAGNOSIS — E875 Hyperkalemia: Secondary | ICD-10-CM | POA: Diagnosis not present

## 2023-05-22 DIAGNOSIS — E119 Type 2 diabetes mellitus without complications: Secondary | ICD-10-CM

## 2023-05-22 DIAGNOSIS — H538 Other visual disturbances: Secondary | ICD-10-CM | POA: Insufficient documentation

## 2023-05-22 DIAGNOSIS — Z8673 Personal history of transient ischemic attack (TIA), and cerebral infarction without residual deficits: Secondary | ICD-10-CM | POA: Insufficient documentation

## 2023-05-22 DIAGNOSIS — Z7982 Long term (current) use of aspirin: Secondary | ICD-10-CM | POA: Diagnosis not present

## 2023-05-22 DIAGNOSIS — Z992 Dependence on renal dialysis: Secondary | ICD-10-CM | POA: Diagnosis not present

## 2023-05-22 DIAGNOSIS — N186 End stage renal disease: Secondary | ICD-10-CM | POA: Diagnosis not present

## 2023-05-22 DIAGNOSIS — Z87898 Personal history of other specified conditions: Secondary | ICD-10-CM

## 2023-05-22 DIAGNOSIS — R531 Weakness: Principal | ICD-10-CM | POA: Insufficient documentation

## 2023-05-22 DIAGNOSIS — E1122 Type 2 diabetes mellitus with diabetic chronic kidney disease: Secondary | ICD-10-CM | POA: Diagnosis not present

## 2023-05-22 DIAGNOSIS — D649 Anemia, unspecified: Secondary | ICD-10-CM | POA: Diagnosis not present

## 2023-05-22 DIAGNOSIS — R569 Unspecified convulsions: Secondary | ICD-10-CM | POA: Diagnosis not present

## 2023-05-22 DIAGNOSIS — I252 Old myocardial infarction: Secondary | ICD-10-CM

## 2023-05-22 DIAGNOSIS — R946 Abnormal results of thyroid function studies: Secondary | ICD-10-CM | POA: Insufficient documentation

## 2023-05-22 DIAGNOSIS — I2510 Atherosclerotic heart disease of native coronary artery without angina pectoris: Secondary | ICD-10-CM | POA: Insufficient documentation

## 2023-05-22 DIAGNOSIS — M6281 Muscle weakness (generalized): Principal | ICD-10-CM | POA: Insufficient documentation

## 2023-05-22 DIAGNOSIS — I5032 Chronic diastolic (congestive) heart failure: Secondary | ICD-10-CM | POA: Diagnosis not present

## 2023-05-22 DIAGNOSIS — Z79899 Other long term (current) drug therapy: Secondary | ICD-10-CM | POA: Insufficient documentation

## 2023-05-22 DIAGNOSIS — R262 Difficulty in walking, not elsewhere classified: Secondary | ICD-10-CM

## 2023-05-22 DIAGNOSIS — I132 Hypertensive heart and chronic kidney disease with heart failure and with stage 5 chronic kidney disease, or end stage renal disease: Secondary | ICD-10-CM | POA: Insufficient documentation

## 2023-05-22 DIAGNOSIS — I480 Paroxysmal atrial fibrillation: Secondary | ICD-10-CM | POA: Insufficient documentation

## 2023-05-22 DIAGNOSIS — Z1152 Encounter for screening for COVID-19: Secondary | ICD-10-CM | POA: Insufficient documentation

## 2023-05-22 DIAGNOSIS — H5440 Blindness, one eye, unspecified eye: Secondary | ICD-10-CM | POA: Diagnosis present

## 2023-05-22 DIAGNOSIS — D500 Iron deficiency anemia secondary to blood loss (chronic): Secondary | ICD-10-CM | POA: Diagnosis not present

## 2023-05-22 DIAGNOSIS — N189 Chronic kidney disease, unspecified: Secondary | ICD-10-CM

## 2023-05-22 DIAGNOSIS — R7989 Other specified abnormal findings of blood chemistry: Secondary | ICD-10-CM

## 2023-05-22 LAB — RESP PANEL BY RT-PCR (RSV, FLU A&B, COVID)  RVPGX2
Influenza A by PCR: NEGATIVE
Influenza B by PCR: NEGATIVE
Resp Syncytial Virus by PCR: NEGATIVE
SARS Coronavirus 2 by RT PCR: NEGATIVE

## 2023-05-22 LAB — TROPONIN I (HIGH SENSITIVITY)
Troponin I (High Sensitivity): 252 ng/L (ref <18)
Troponin I (High Sensitivity): 261 ng/L (ref <18)
Troponin I (High Sensitivity): 217 ng/L (ref <18)

## 2023-05-22 LAB — CBC WITH DIFFERENTIAL/PLATELET
Abs Immature Granulocytes: 0.05 10*3/uL (ref 0.00–0.07)
Basophils Absolute: 0.1 10*3/uL (ref 0.0–0.1)
Basophils Relative: 1 %
Eosinophils Absolute: 1.3 10*3/uL — ABNORMAL HIGH (ref 0.0–0.5)
Eosinophils Relative: 11 %
HCT: 23.9 % — ABNORMAL LOW (ref 39.0–52.0)
Hemoglobin: 7 g/dL — ABNORMAL LOW (ref 13.0–17.0)
Immature Granulocytes: 0 %
Lymphocytes Relative: 10 %
Lymphs Abs: 1.2 10*3/uL (ref 0.7–4.0)
MCH: 22.5 pg — ABNORMAL LOW (ref 26.0–34.0)
MCHC: 29.3 g/dL — ABNORMAL LOW (ref 30.0–36.0)
MCV: 76.8 fL — ABNORMAL LOW (ref 80.0–100.0)
Monocytes Absolute: 1.1 10*3/uL — ABNORMAL HIGH (ref 0.1–1.0)
Monocytes Relative: 9 %
Neutro Abs: 8.1 10*3/uL — ABNORMAL HIGH (ref 1.7–7.7)
Neutrophils Relative %: 69 %
Platelets: 286 10*3/uL (ref 150–400)
RBC: 3.11 MIL/uL — ABNORMAL LOW (ref 4.22–5.81)
RDW: 25.2 % — ABNORMAL HIGH (ref 11.5–15.5)
WBC: 11.8 10*3/uL — ABNORMAL HIGH (ref 4.0–10.5)
nRBC: 0 % (ref 0.0–0.2)

## 2023-05-22 LAB — LACTIC ACID, PLASMA: Lactic Acid, Venous: 0.7 mmol/L (ref 0.5–1.9)

## 2023-05-22 LAB — COMPREHENSIVE METABOLIC PANEL WITH GFR
ALT: 8 U/L (ref 0–44)
AST: 13 U/L — ABNORMAL LOW (ref 15–41)
Albumin: 2.2 g/dL — ABNORMAL LOW (ref 3.5–5.0)
Alkaline Phosphatase: 78 U/L (ref 38–126)
Anion gap: 13 (ref 5–15)
BUN: 110 mg/dL — ABNORMAL HIGH (ref 6–20)
CO2: 22 mmol/L (ref 22–32)
Calcium: 8.5 mg/dL — ABNORMAL LOW (ref 8.9–10.3)
Chloride: 102 mmol/L (ref 98–111)
Creatinine, Ser: 12.72 mg/dL — ABNORMAL HIGH (ref 0.61–1.24)
GFR, Estimated: 4 mL/min — ABNORMAL LOW (ref >60)
Glucose, Bld: 89 mg/dL (ref 70–99)
Potassium: 4.6 mmol/L (ref 3.5–5.1)
Sodium: 137 mmol/L (ref 135–145)
Total Bilirubin: 0.7 mg/dL (ref 0.0–1.2)
Total Protein: 6.3 g/dL — ABNORMAL LOW (ref 6.5–8.1)

## 2023-05-22 LAB — GLUCOSE, CAPILLARY
Glucose-Capillary: 72 mg/dL (ref 70–99)
Glucose-Capillary: 106 mg/dL — ABNORMAL HIGH (ref 70–99)

## 2023-05-22 MED ORDER — SODIUM CHLORIDE 0.9 % IV SOLN: 1.0000 g | Freq: Once | INTRAVENOUS | Status: DC

## 2023-05-22 MED ORDER — CINACALCET HCL 30 MG PO TABS
60.0000 mg | ORAL_TABLET | ORAL | Status: DC
  Administered 2023-05-24 – 2023-05-26 (×2): 60 mg via ORAL
  Filled 2023-05-22 (×2): qty 2

## 2023-05-22 MED ORDER — AMIODARONE HCL 200 MG PO TABS
200.0000 mg | ORAL_TABLET | ORAL | Status: DC
Start: 2023-05-23 — End: 2023-05-26
  Administered 2023-05-23 – 2023-05-26 (×4): 200 mg via ORAL
  Filled 2023-05-22 (×4): qty 1

## 2023-05-22 MED ORDER — CALCIUM ACETATE (PHOS BINDER) 667 MG PO CAPS
1334.0000 mg | ORAL_CAPSULE | Freq: Three times a day (TID) | ORAL | Status: DC
  Administered 2023-05-23 – 2023-05-26 (×10): 1334 mg via ORAL
  Filled 2023-05-22 (×10): qty 2

## 2023-05-22 MED ORDER — LOPERAMIDE HCL 2 MG PO CAPS
2.0000 mg | ORAL_CAPSULE | ORAL | Status: DC | PRN
  Administered 2023-05-25 – 2023-05-26 (×2): 2 mg via ORAL
  Filled 2023-05-22 (×2): qty 1

## 2023-05-22 MED ORDER — ONDANSETRON HCL 4 MG PO TABS: 4.0000 mg | ORAL_TABLET | Freq: Four times a day (QID) | ORAL | Status: DC | PRN

## 2023-05-22 MED ORDER — ACETAMINOPHEN 650 MG RE SUPP: 650.0000 mg | Freq: Four times a day (QID) | RECTAL | Status: DC | PRN

## 2023-05-22 MED ORDER — RENA-VITE PO TABS
1.0000 | ORAL_TABLET | Freq: Every day | ORAL | Status: DC
  Administered 2023-05-22 – 2023-05-25 (×4): 1 via ORAL
  Filled 2023-05-22 (×4): qty 1

## 2023-05-22 MED ORDER — VANCOMYCIN HCL IN DEXTROSE 1-5 GM/200ML-% IV SOLN: 1000.0000 mg | Freq: Once | INTRAVENOUS | Status: DC

## 2023-05-22 MED ORDER — ASPIRIN 81 MG PO TBEC
81.0000 mg | DELAYED_RELEASE_TABLET | Freq: Every day | ORAL | Status: DC
  Administered 2023-05-23 – 2023-05-26 (×4): 81 mg via ORAL
  Filled 2023-05-22 (×4): qty 1

## 2023-05-22 MED ORDER — ACETAMINOPHEN 325 MG PO TABS
650.0000 mg | ORAL_TABLET | Freq: Four times a day (QID) | ORAL | Status: DC | PRN
  Administered 2023-05-22: 650 mg via ORAL
  Filled 2023-05-22: qty 2

## 2023-05-22 MED ORDER — LEVETIRACETAM 500 MG PO TABS
500.0000 mg | ORAL_TABLET | ORAL | Status: DC
  Administered 2023-05-22 – 2023-05-26 (×3): 500 mg via ORAL
  Filled 2023-05-22 (×4): qty 1

## 2023-05-22 MED ORDER — POLYETHYLENE GLYCOL 3350 17 G PO PACK: 17.0000 g | PACK | Freq: Every day | ORAL | Status: DC | PRN

## 2023-05-22 MED ORDER — PROSOURCE PLUS PO LIQD
30.0000 mL | Freq: Three times a day (TID) | ORAL | Status: DC
  Administered 2023-05-22 – 2023-05-26 (×10): 30 mL via ORAL
  Filled 2023-05-22 (×10): qty 30

## 2023-05-22 MED ORDER — ATORVASTATIN CALCIUM 40 MG PO TABS
80.0000 mg | ORAL_TABLET | Freq: Every day | ORAL | Status: DC
  Administered 2023-05-22 – 2023-05-25 (×4): 80 mg via ORAL
  Filled 2023-05-22 (×4): qty 2

## 2023-05-22 MED ORDER — HEPARIN SODIUM (PORCINE) 5000 UNIT/ML IJ SOLN
5000.0000 [IU] | Freq: Three times a day (TID) | INTRAMUSCULAR | Status: DC
  Administered 2023-05-22 – 2023-05-25 (×9): 5000 [IU] via SUBCUTANEOUS
  Filled 2023-05-22 (×10): qty 1

## 2023-05-22 MED ORDER — LEVETIRACETAM 500 MG PO TABS
750.0000 mg | ORAL_TABLET | ORAL | Status: DC
  Administered 2023-05-23 – 2023-05-25 (×2): 750 mg via ORAL
  Filled 2023-05-22 (×3): qty 1

## 2023-05-22 MED ORDER — CARVEDILOL 12.5 MG PO TABS
12.5000 mg | ORAL_TABLET | Freq: Two times a day (BID) | ORAL | Status: DC
  Administered 2023-05-22 – 2023-05-26 (×8): 12.5 mg via ORAL
  Filled 2023-05-22 (×8): qty 1

## 2023-05-22 MED ORDER — VANCOMYCIN HCL 1750 MG/350ML IV SOLN: 1750.0000 mg | Freq: Once | INTRAVENOUS | Status: DC

## 2023-05-22 MED ORDER — CALCITRIOL 0.25 MCG PO CAPS
2.2500 ug | ORAL_CAPSULE | ORAL | Status: DC
  Administered 2023-05-24 – 2023-05-26 (×2): 2.25 ug via ORAL
  Filled 2023-05-22 (×2): qty 9

## 2023-05-22 MED ORDER — ONDANSETRON HCL 4 MG/2ML IJ SOLN
4.0000 mg | Freq: Four times a day (QID) | INTRAMUSCULAR | Status: DC | PRN
  Administered 2023-05-26: 4 mg via INTRAVENOUS
  Filled 2023-05-22: qty 2

## 2023-05-22 MED ORDER — OXYCODONE HCL 5 MG PO TABS
5.0000 mg | ORAL_TABLET | ORAL | Status: DC | PRN
  Administered 2023-05-23 – 2023-05-24 (×4): 5 mg via ORAL
  Filled 2023-05-22 (×4): qty 1

## 2023-05-22 NOTE — ED Triage Notes (Signed)
 Pt BIB [facility] for generalized weakness and fatigue, worse today.   Pt recently home from rehab about a week ago.

## 2023-05-22 NOTE — Assessment & Plan Note (Addendum)
 Likely from symptomatic chronic anemia. -Consider transfusing 1 unit with dialysis tomorrow -Check TSH

## 2023-05-22 NOTE — Assessment & Plan Note (Signed)
 Stable and compensated. -Per HD.

## 2023-05-22 NOTE — ED Provider Notes (Signed)
 [facility] AT [facility] Provider Note   CSN: [id number] Arrival date & time: 05/22/23  1203     History  Chief Complaint  Patient presents with   Weakness    [name]. is a 53 y.o. male.  Patient is a 53 year old male who presents to the emergency department with a chief complaint of increased weakness over the past few days as well as pain to his right stump.  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  He was in rehab for approximate the past week and was discharged back home.  He notes that he does live with his wife but notes that she is unable to care for him during the day.  Of note patient was supposed to get dialysis yesterday but was unable to do so secondary to weakness and they were unable to transfer him to the chair to be dialyzed.  Patient denies any associated fever, chills, chest pain or shortness of breath.  He denies any dizziness, lightheadedness or syncopal events.  He has had no associated nausea, vomiting, diarrhea or abdominal pain.   Weakness      Home Medications Prior to Admission medications   Medication Sig Start Date End Date Taking? Authorizing Provider  acetaminophen (TYLENOL) 325 MG tablet Take 1-2 tablets (325-650 mg total) by mouth every 4 (four) hours as needed for mild pain. 05/04/21   Love, Evlyn Kanner, PA-C  amiodarone (PACERONE) 200 MG tablet Take 1 tablet (200 mg total) by mouth every morning. 08/02/22 08/03/23  Corky Crafts, MD  ascorbic acid (VITAMIN C) 500 MG tablet Take 1,000 mg by mouth daily.    [provider]  aspirin EC 81 MG tablet Take 1 tablet (81 mg total) by mouth daily. Swallow whole. 04/12/23   Rai, Delene Ruffini, MD  atorvastatin (LIPITOR) 80 MG tablet TAKE ONE TABLET BY MOUTH AT BEDTIME. 03/22/23   Gaston Islam., NP  calcitRIOL (ROCALTROL) 0.25 MCG capsule Take 9 capsules (2.25 mcg total) by mouth every Tuesday, Thursday, and  Saturday at 6 PM. 02/24/23   Danford, Earl Lites, MD  calcium acetate (PHOSLO) 667 MG capsule Take 2 capsules (1,334 mg total) by mouth 3 (three) times daily with meals. 05/04/21   Love, Evlyn Kanner, PA-C  carvedilol (COREG) 12.5 MG tablet Take 1 tablet (12.5 mg total) by mouth 2 (two) times daily. 02/23/23   Alberteen Sam, MD  cinacalcet (SENSIPAR) 30 MG tablet Take 2 tablets (60 mg total) by mouth every Tuesday, Thursday, and Saturday at 6 PM. 02/24/23   Danford, Earl Lites, MD  dorzolamide-timolol (COSOPT) 2-0.5 % ophthalmic solution Place 1 drop into the right eye 2 (two) times daily. 11/15/22 11/15/23  [provider]  levETIRAcetam (KEPPRA) 500 MG tablet Take 1 tablet (500 mg total) by mouth 4 (four) times a week. On Sunday, Monday, Wednesday and Friday, all non-dialysis days 02/25/23   Alberteen Sam, MD  levETIRAcetam (KEPPRA) 750 MG tablet Take 1 tablet (750 mg total) by mouth 3 (three) times a week. On Tuesday Thursday and Saturday, on dialysis days 02/24/23   Alberteen Sam, MD  lidocaine-prilocaine (EMLA) cream Apply 1 Application topically 3 (three) times a week. 12/24/21   [provider]  loperamide (IMODIUM) 2 MG capsule Take 1 capsule (2 mg total) by mouth as needed for diarrhea or loose stools. 02/23/23   Danford, Earl Lites, MD  multivitamin (RENA-VIT) TABS tablet Take 1  tablet by mouth at bedtime. 04/12/23   Rai, Delene Ruffini, MD  nutrition supplement, JUVEN, (JUVEN) PACK Take 1 packet by mouth 2 (two) times daily between meals. 02/23/23   Danford, Earl Lites, MD  Nutritional Supplements (,FEEDING SUPPLEMENT, PROSOURCE PLUS) liquid Take 30 mLs by mouth 3 (three) times daily between meals. 02/23/23   Danford, Earl Lites, MD  ondansetron (ZOFRAN) 4 MG tablet Take 1 tablet (4 mg total) by mouth every 6 (six) hours as needed for nausea or vomiting. 03/17/23   Al Decant, PA-C  oxyCODONE (OXY IR/ROXICODONE) 5 MG immediate release tablet Take 1  tablet (5 mg total) by mouth every 4 (four) hours as needed for moderate pain (pain score 4-6). 05/17/23   Dahal, Melina Schools, MD  polyethylene glycol (MIRALAX / GLYCOLAX) 17 g packet Take 17 g by mouth daily. 04/12/23   Rai, Delene Ruffini, MD  zinc sulfate, 50mg  elemental zinc, 220 (50 Zn) MG capsule Take 220 mg by mouth daily.    [provider]      Allergies    Promethazine    Review of Systems   Review of Systems  Neurological:  Positive for weakness.  All other systems reviewed and are negative.   Physical Exam Updated Vital Signs BP 126/72   Pulse (!) 58   Temp 98.2 F (36.8 C) (Oral)   Resp 16   Ht 6\' 3"  (1.905 m)   Wt 79.4 kg   SpO2 97%   BMI 21.87 kg/m  Physical Exam Vitals and nursing note reviewed.  Constitutional:      Appearance: Normal appearance.  HENT:     Head: Normocephalic and atraumatic.     Nose: Nose normal.     Mouth/Throat:     Mouth: Mucous membranes are moist.  Eyes:     Extraocular Movements: Extraocular movements intact.     Conjunctiva/sclera: Conjunctivae normal.     Pupils: Pupils are equal, round, and reactive to light.  Cardiovascular:     Rate and Rhythm: Normal rate and regular rhythm.     Pulses: Normal pulses.     Heart sounds: Normal heart sounds. No murmur heard.    No gallop.  Pulmonary:     Effort: Pulmonary effort is normal. No respiratory distress.     Breath sounds: Normal breath sounds. No wheezing or rales.  Abdominal:     General: Abdomen is flat. Bowel sounds are normal. There is no distension.     Palpations: Abdomen is soft.     Tenderness: There is no abdominal tenderness. There is no guarding.  Musculoskeletal:        General: Normal range of motion.     Cervical back: Normal range of motion and neck supple.     Comments: Surgical site over the right stump with no dehiscence of the wound, no surrounding erythema or purulent discharge, tenderness palpation noted over the right stump  Skin:    General: Skin is  warm and dry.  Neurological:     General: No focal deficit present.     Mental Status: He is alert and oriented to person, place, and time. Mental status is at baseline.     Cranial Nerves: No cranial nerve deficit.     Sensory: No sensory deficit.     Coordination: Coordination normal.  Psychiatric:        Mood and Affect: Mood normal.        Behavior: Behavior normal.        Thought Content:  Thought content normal.        Judgment: Judgment normal.     ED Results / Procedures / Treatments   Labs (all labs ordered are listed, but only abnormal results are displayed) Labs Reviewed  COMPREHENSIVE METABOLIC PANEL WITH GFR - Abnormal; Notable for the following components:      Result Value   BUN 110 (*)    Creatinine, Ser 12.72 (*)    Calcium 8.5 (*)    Total Protein 6.3 (*)    Albumin 2.2 (*)    AST 13 (*)    GFR, Estimated 4 (*)    All other components within normal limits  CBC WITH DIFFERENTIAL/PLATELET - Abnormal; Notable for the following components:   WBC 11.8 (*)    RBC 3.11 (*)    Hemoglobin 7.0 (*)    HCT 23.9 (*)    MCV 76.8 (*)    MCH 22.5 (*)    MCHC 29.3 (*)    RDW 25.2 (*)    Neutro Abs 8.1 (*)    Monocytes Absolute 1.1 (*)    Eosinophils Absolute 1.3 (*)    All other components within normal limits  TROPONIN I (HIGH SENSITIVITY) - Abnormal; Notable for the following components:   Troponin I (High Sensitivity) 252 (*)    All other components within normal limits  TROPONIN I (HIGH SENSITIVITY) - Abnormal; Notable for the following components:   Troponin I (High Sensitivity) 261 (*)    All other components within normal limits  RESP PANEL BY RT-PCR (RSV, FLU A&B, COVID)  [id number]  CULTURE, BLOOD (ROUTINE X 2)  CULTURE, BLOOD (ROUTINE X 2)  LACTIC ACID, PLASMA  URINALYSIS, ROUTINE W REFLEX MICROSCOPIC  TYPE AND SCREEN    EKG EKG Interpretation Date/Time:  Sunday May 22 2023 12:18:28 EDT Ventricular Rate:  60 PR Interval:  200 QRS Duration:  114 QT  Interval:  487 QTC Calculation: 487 R Axis:   42  Text Interpretation: Sinus rhythm Probable left ventricular hypertrophy Minimal ST elevation, inferior leads Borderline prolonged QT interval Confirmed by Gloris Manchester ([id number]) on 05/22/2023 1:32:00 PM  Radiology DG Femur Min 2 Views Right Result Date: 05/22/2023 CLINICAL DATA:  Stump infection EXAM: RIGHT FEMUR 2 VIEWS COMPARISON:  05/05/2023 FINDINGS: 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 Sharp resection margin without erosion or periosteal elevation. No fracture or dislocation. Severe atherosclerotic calcifications. Soft tissue prominence of the distal stump. No soft tissue gas. 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 No radiographic evidence of osteomyelitis. Electronically Signed   By: Duanne Guess D.O.   On: 05/22/2023 14:47   DG Chest Port 1 View Result Date: 05/22/2023 CLINICAL DATA:  Weakness EXAM: PORTABLE CHEST 1 VIEW COMPARISON:  05/11/2023 FINDINGS: Stable cardiomegaly. Patchy right lower lobe opacity, improving. Small right pleural effusion, minimally improved. Left lung is clear. No pneumothorax. IMPRESSION: Patchy right lower lobe opacity, improving. Small right pleural effusion, minimally improved. Electronically Signed   By: Duanne Guess D.O.   On: 05/22/2023 12:49    Procedures Procedures    Medications Ordered in ED Medications - No data to display  ED Course/ Medical Decision Making/ A&P                                 Medical Decision Making Amount and/or Complexity of Data Reviewed Labs: ordered. Radiology: ordered.  Risk Decision regarding hospitalization.  This patient presents to the ED for concern of weakness, this involves an extensive number of treatment options, and is a complaint that carries with it a high risk of complications and morbidity.  The differential diagnosis includes abscess, pneumonia, CVA, TIA, ACS,  pulmonary embolus, anemia   Co morbidities that complicate the patient evaluation  Chronic kidney disease, dialysis dependent   Additional history obtained:  Additional history obtained from spouse External records from outside source obtained and reviewed including medical records   Lab Tests:  I Ordered, and personally interpreted labs.  The pertinent results include: Anemia, mild leukocytosis, elevated creatinine, elevated troponin, normal lactic acid, negative viral swab   Imaging Studies ordered:  I ordered imaging studies including chest x-ray, x-ray of right femur I independently visualized and interpreted imaging which showed right pleural effusion, no acute osseous injury or lesions of the right femur I agree with the radiologist interpretation   Cardiac Monitoring: / EKG:  The patient was maintained on a cardiac monitor.  I personally viewed and interpreted the cardiac monitored which showed an underlying rhythm of: Normal sinus rhythm, normal PR/QRS interval, prolonged QTc, no ST changes, nonspecific T wave changes, EKG consistent with previous, no STEMI   Consultations Obtained:  I requested consultation with the hospitalist,  and discussed lab and imaging findings as well as pertinent plan - they recommend: Admission  MDM Patient does remain stable at this time.  Discussed with patient we will plan for admission to the hospital service given his progressive worsening weakness and inability to ambulate or care for himself at home.  Patient does live with his wife though she does work during the week and cannot care for him during the day.  His anemia is at baseline at this point but he is borderline due for transfusion.  Patient also did miss dialysis yesterday but does not warrant emergent dialysis at this time.  Patient has no clear indication for cellulitis of his right stump and no indication for abscess formation.  He is already on Keflex at this time and will  continue this.  Did discuss patient case with Dr. Wendall Stade who has accepted the patient for admission at this time.  Social Determinants of Health:  Lives at home with wife   Test / Admission - Considered:  Admission        Final Clinical Impression(s) / ED Diagnoses Final diagnoses:  None    Rx / DC Orders ED Discharge Orders     None         Kathlen Mody 05/22/23 1630    Gloris Manchester, MD 05/23/23 1517

## 2023-05-22 NOTE — Assessment & Plan Note (Signed)
 Require revision by Dr. Alvie Heidelberg.  3/22- Stump swelling was noted, sutures were released with large amount of blood noted.  Blood loss likely contributing to patient's chronic anemia.

## 2023-05-22 NOTE — Assessment & Plan Note (Signed)
 Continue Keppra

## 2023-05-22 NOTE — H&P (Addendum)
 History and Physical    [name]. XBM:[id number] DOB: Dec 17, 1970 DOA: 05/22/2023  PCP: [name], FNP   Patient coming from: Home  I have personally briefly reviewed patient's old medical records in [facility]  Chief Complaint: Weakness  HPI: [name]. is a 53 y.o. male with medical history significant for ESRD on dialysis T Th Sat, right above-knee amputation, hypertension, seizure, paroxysmal A-fib, blind in left eye, diastolic CHF. Patient presented to the ED with complaints of generalized weakness over the past few days.  He went for dialysis yesterday, he was not dialyzed because were unable to transfer him onto the dialysis chair, patient reports he left his 'lift" at home.  2 episodes of vomiting 2 days ago but no blood. Chronic diarrhea that is unchanged.  No black stools no bloody stools. . No chest pain, no cough, no chest pain or difficulty breathing.  Hospitalized 3/13- 3/25 for all effusion, he underwent thoracentesis by IR with drainage of 1.5 L of fluid thought to be parapneumonic effusion trauma related.  Subsequent CT showed very large persistent right effusion.  Patient had pigtail chest tube placed by pulmonology with improved drainage, and then had light 6 infused per chest tube with resolution of oral effusion.  Cytology did not show malignant cells. He also had right AKA stump wound dehiscence and underwent right AKA revision 8/19.  Subsequently large amount of blood collected in the stump that was when sutures were released- 3/22. Received total of 3 units PRBC during specialization.  Was subsequently discharged to rehab and has been home for about a week now.  ED Course: Temperature 98.2.  Heart rate 58-60.  Respirate rate 8 - 16.  Blood pressure systolic 117-143. O2 sats greater than 95% on room air. Chest x-ray shows patchy right lower lobe opacity that is improving. Troponin 252 > 261.  BUN elevated 110. EKG unchanged. Hospitalist  admit for symptomatic anemia, missed hemodialysis  Review of Systems: As per HPI all other systems reviewed and negative.  Past Medical History:  Diagnosis Date   Acute Bil  CVAs of Bil Frontal amd Rt Parietal  in Watershed Distribution 04/21/2021   Patchy acute cortically-based infarcts within the bilateral high  frontal lobes and right parietal lobe (predominantly in a watershed  distribution).   Subcentimeter acute infarct within the callosal splenium on the  left.     Acute bilateral cerebral infarction in a watershed distribution [facility]) 04/21/2021   a.) MRI brain 04/21/2021 --> patchy ACUTE cortically-based infarcts within the bilateral high frontal lobes and right parietal lobe   Acute cerebral infarction (HCC) 02/13/2021   a.) MRI brain 02/13/2021: acute LEFT hippocampal infarct   Acute cerebral infarction (HCC) 04/21/2021   a.) MRI brain 04/21/2021: subcentimeter ACUTE infarct within the callosal splenium on the LEFT   Acute osteomyelitis of toe of right foot with gangrene (HCC) 02/26/2022   Anaphylactic reaction due to adverse effect of correct drug or medicament properly administered, initial encounter 05/08/2019   Anemia of chronic renal failure    Anxiety    Aortic dilatation (HCC) 11/25/2020   a.) TTE 11/25/2020: Ao root measured 38 mm. b.) TTE 04/22/2021: Ao root measured 44 mm; c. 11/2021 Echo: Ao root 40mm.   Atrial fibrillation (HCC)    a.) CHA2DS2-VASc = 6 (CHF, HTN, CVA x2, prior MI, T2DM). b.) rate/rhythm maintained on oral amiodarone + carvedilol; chronic OAC/AP therapy discontinued following ICH   Benign prostatic hyperplasia with lower urinary tract symptoms 11/07/2019  BPH (benign prostatic hyperplasia)    Cardiac arrest (HCC) 07/26/2021   a.) in setting of hyperkalemia, NSTEMI, and seizure following missed HD; pulseless and apneic --> required 1 round of CPR prior to ROSC   Cellulitis    Cerebral hemorrhage (HCC) 11/21/2021   a.) CT head 11/21/2021 --> 1.4 x 1.5 x  3.1 cm ACUTE hemorrhage within the inferior aspect of the fourth ventricle, and extending inferiorly through the foramen of foramen of [place] --> occurred in setting of HTN emergency and DOAC/APT-->eliquis/plavix d/c'd.   Cerebral microvascular disease    Chronic diarrhea    Chronic heart failure with preserved ejection fraction (HFpEF) (HCC) 07/19/2017   a.) 06/2017 Echo: EF 75%; b.) 11/2020 Echo: EF 55-60%; c.) 04/2021 Echo: EF 55-60%; d.) 11/2021 Echo: EF 65-70%, GrII DD, nl RV fxn, sev dil LA, large circumferential pericardial eff w/o tamponade, triv MR, AoV sclerosis. Ao root 40mm; e.) TTE 02/28/2025: EF 60-65%, sev LVH, mod posterior LV effusion, AoV sclerosis   Chronic pain of both ankles    Coronary artery disease 02/26/2020   a. 02/2020 Ant STEMI/PCI: LM nl, LAD 63m (3.5x30 Resolute Onyx), RI nl, LCX min irregs, RCA min irregs. EF 65%.   DDD (degenerative disc disease), cervical    Diabetes mellitus, type II (HCC)    Diabetic neuropathy (HCC)    Esophagitis    ESRD (end stage renal disease) on dialysis (HCC)    a.) [facility]; T-Th-Sat   Frequent falls    Gait instability    Gastritis    Gastroparesis    GERD (gastroesophageal reflux disease)    H/O enucleation of left eyeball    Heart palpitations    History of kidney stones    HLD (hyperlipidemia)    Hypertension    Insomnia    a.) uses melatonin PRN   Long term current use of amiodarone    Nausea and vomiting in adult    recurrent   NSTEMI (non-ST elevated myocardial infarction) (HCC) 07/26/2021   a.) hyperkalemic from missed HD; seizures; decompensated to cardiac arrest and required CPR x 1 round prior to ROSC.   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  Added automatically from request for surgery [id number]     Osteomyelitis [facility])    Pericardial effusion    a. 11/2021 Echo: EF 65-70%, GrII DD, nl RV fxn, sev dil LA, large circumferential  pericardial eff w/o tamponade, triv MR, AoV sclerosis. Ao root 40mm.   Seizure (HCC)    a.) last 07/26/2021 in setting of missed HD --> hyperkalemic at 6.5 --> pulseless/apneic and required CPR; discharged home on levetiracetam.   ST elevation myocardial infarction (STEMI) of anterior wall (HCC) 02/26/2020   a.) LHC/PCI 02/26/2020 --> EF 65%; LVEDP 11 mmHg; 90% mLAD (3.5 x 20 mm Resolute Onyx DES x 1)    Past Surgical History:  Procedure Laterality Date   A/V FISTULAGRAM Left 08/31/2021   Procedure: A/V Fistulagram;  Surgeon: Annice Needy, MD;  Location: [name] INVASIVE CV LAB;  Service: Cardiovascular;  Laterality: Left;   A/V FISTULAGRAM Left 01/25/2022   Procedure: A/V Fistulagram;  Surgeon: Annice Needy, MD;  Location: [name] INVASIVE CV LAB;  Service: Cardiovascular;  Laterality: Left;   A/V FISTULAGRAM Right 10/18/2022   Procedure: A/V Fistulagram;  Surgeon: Annice Needy, MD;  Location: [name] INVASIVE CV LAB;  Service: Cardiovascular;  Laterality: Right;   ABDOMINAL AORTOGRAM W/LOWER EXTREMITY Bilateral 02/08/2023  Procedure: ABDOMINAL AORTOGRAM W/LOWER EXTREMITY;  Surgeon: Daria Pastures, MD;  Location: [facility] INVASIVE CV LAB;  Service: Vascular;  Laterality: Bilateral;   AMPUTATION Left 03/16/2016   Procedure: AMPUTATION DIGIT LEFT HALLUX;  Surgeon: Vivi Barrack, DPM;  Location: [facility] OR;  Service: Podiatry;  Laterality: Left;  can start around 5    AMPUTATION Right 02/09/2021   Procedure: AMPUTATION DIGIT;  Surgeon: Marlyne Beards, MD;  Location: [facility] OR;  Service: Orthopedics;  Laterality: Right;   AMPUTATION Right 02/04/2023   Procedure: AMPUTATION 4TH AND 5TH METATARSAL;  Surgeon: Pilar Plate, DPM;  Location: [facility] OR;  Service: Orthopedics/Podiatry;  Laterality: Right;  I&D, partial ray resection, abx beads   AMPUTATION Right 02/09/2023   Procedure: RIGHT BELOW KNEE AMPUTATION;  Surgeon: [name], MD;  Location: [facility] OR;  Service: Orthopedics;  Laterality: Right;    AMPUTATION Right 04/06/2023   Procedure: RIGHT ABOVE KNEE AMPUTATION;  Surgeon: [name], MD;  Location: [facility] OR;  Service: Orthopedics;  Laterality: Right;   AMPUTATION Right 05/11/2023   Procedure: RIGHT ABOVE KNEE AMPUTATION;  Surgeon: [name], MD;  Location: [facility] OR;  Service: Orthopedics;  Laterality: Right;  REVISION RIGHT ABOVE KNEE AMPUTATION   AMPUTATION TOE Right 02/28/2022   Procedure: AMPUTATION TOE;  Surgeon: Louann Sjogren, DPM;  Location: [name] ORS;  Service: Podiatry;  Laterality: Right;  right fifth toe   ARTHROSCOPIC REPAIR ACL Left    AV FISTULA PLACEMENT Right 05/31/2019   Procedure: Brachiocephalic AV fistula creation;  Surgeon: Annice Needy, MD;  Location: [name] ORS;  Service: Vascular;  Laterality: Right;   AV FISTULA PLACEMENT Left 08/13/2021   Procedure: ARTERIOVENOUS (AV) FISTULA CREATION (BRACHIALCEPHALIC);  Surgeon: Annice Needy, MD;  Location: [name] ORS;  Service: Vascular;  Laterality: Left;   CHEST TUBE INSERTION Right 05/06/2023   Procedure: CHEST TUBE INSERTION;  Surgeon: Lorin Glass, MD;  Location: [facility] ENDOSCOPY;  Service: Pulmonary;  Laterality: Right;   COLONOSCOPY WITH PROPOFOL N/A 10/28/2015   Procedure: COLONOSCOPY WITH PROPOFOL;  Surgeon: Christena Deem, MD;  Location: [facility] ENDOSCOPY;  Service: Endoscopy;  Laterality: N/A;   COLONOSCOPY WITH PROPOFOL N/A 10/29/2015   Procedure: COLONOSCOPY WITH PROPOFOL;  Surgeon: Christena Deem, MD;  Location: [facility] ENDOSCOPY;  Service: Endoscopy;  Laterality: N/A;   COLONOSCOPY WITH PROPOFOL N/A 01/27/2023   Procedure: COLONOSCOPY WITH PROPOFOL;  Surgeon: Franky Macho, MD;  Location: [facility];  Service: Endoscopy;  Laterality: N/A;  12:30pm, asa 3   CORONARY ULTRASOUND/IVUS N/A 02/26/2020   Procedure: Intravascular Ultrasound/IVUS;  Surgeon: Corky Crafts, MD;  Location: [facility] INVASIVE CV LAB;  Service: Cardiovascular;  Laterality: N/A;   CORONARY/GRAFT ACUTE MI REVASCULARIZATION N/A 02/26/2020    Procedure: Coronary/Graft Acute MI Revascularization;  Surgeon: Corky Crafts, MD;  Location: [facility] INVASIVE CV LAB;  Service: Cardiovascular;  Laterality: N/A;   DIALYSIS/PERMA CATHETER INSERTION Right 04/26/2019   Perm Cath    DIALYSIS/PERMA CATHETER INSERTION N/A 04/26/2019   Procedure: DIALYSIS/PERMA CATHETER INSERTION;  Surgeon: Annice Needy, MD;  Location: [name] INVASIVE CV LAB;  Service: Cardiovascular;  Laterality: N/A;   DIALYSIS/PERMA CATHETER INSERTION N/A 05/25/2021   Procedure: DIALYSIS/PERMA CATHETER INSERTION;  Surgeon: Annice Needy, MD;  Location: [name] INVASIVE CV LAB;  Service: Cardiovascular;  Laterality: N/A;   DIALYSIS/PERMA CATHETER INSERTION N/A 08/31/2021   Procedure: DIALYSIS/PERMA CATHETER INSERTION;  Surgeon: Annice Needy, MD;  Location: [name] INVASIVE CV LAB;  Service: Cardiovascular;  Laterality: N/A;   DIALYSIS/PERMA CATHETER  REMOVAL N/A 08/15/2019   Procedure: DIALYSIS/PERMA CATHETER REMOVAL;  Surgeon: Renford Dills, MD;  Location: [name] INVASIVE CV LAB;  Service: Cardiovascular;  Laterality: N/A;   DIALYSIS/PERMA CATHETER REMOVAL N/A 03/15/2022   Procedure: DIALYSIS/PERMA CATHETER REMOVAL;  Surgeon: Annice Needy, MD;  Location: [name] INVASIVE CV LAB;  Service: Cardiovascular;  Laterality: N/A;   EMBOLIZATION (CATH LAB) Right 02/06/2021   Procedure: EMBOLIZATION;  Surgeon: Annice Needy, MD;  Location: [name] INVASIVE CV LAB;  Service: Cardiovascular;  Laterality: Right;  Right Upper Extremity Dialysis Access, Permcath Placement   ESOPHAGOGASTRODUODENOSCOPY (EGD) WITH PROPOFOL N/A 12/27/2017   Procedure: ESOPHAGOGASTRODUODENOSCOPY (EGD) WITH PROPOFOL;  Surgeon: Toledo, Boykin Nearing, MD;  Location: [name] ENDOSCOPY;  Service: Gastroenterology;  Laterality: N/A;   EYE SURGERY     IR THORACENTESIS ASP PLEURAL SPACE W/IMG GUIDE  05/05/2023   LEFT HEART CATH AND CORONARY ANGIOGRAPHY N/A 02/26/2020   Procedure: LEFT HEART CATH AND CORONARY ANGIOGRAPHY;  Surgeon: Corky Crafts, MD;  Location: [facility] INVASIVE CV LAB;  Service: Cardiovascular;  Laterality: N/A;   PROSTATE SURGERY  2016   REVISON OF ARTERIOVENOUS FISTULA Right 07/22/2022   Procedure: RESECTION OF ANEURYSMAL RIGHT ARM ARTERIOVENOUS FISTULA;  Surgeon: Annice Needy, MD;  Location: [name] ORS;  Service: Vascular;  Laterality: Right;   TONSILECTOMY/ADENOIDECTOMY WITH MYRINGOTOMY     TONSILLECTOMY     UPPER EXTREMITY ANGIOGRAPHY Right 11/26/2020   Procedure: Upper Extremity Angiography;  Surgeon: Annice Needy, MD;  Location: [name] INVASIVE CV LAB;  Service: Cardiovascular;  Laterality: Right;   UPPER EXTREMITY ANGIOGRAPHY Right 02/05/2021   Procedure: UPPER EXTREMITY ANGIOGRAPHY;  Surgeon: Annice Needy, MD;  Location: [name] INVASIVE CV LAB;  Service: Cardiovascular;  Laterality: Right;     reports that he has never smoked. He has never used smokeless tobacco. He reports that he does not drink alcohol and does not use drugs.  Allergies  Allergen Reactions   Promethazine Diarrhea and Other (See Comments)    Muscle cramps, cramping "all over"    Family History  Problem Relation Age of Onset   CAD Father    Stroke Father    Diabetes Mellitus II Mother    Kidney failure Mother    Schizophrenia Mother     Prior to Admission medications   Medication Sig Start Date End Date Taking? Authorizing Provider  acetaminophen (TYLENOL) 325 MG tablet Take 1-2 tablets (325-650 mg total) by mouth every 4 (four) hours as needed for mild pain. 05/04/21   Love, Evlyn Kanner, PA-C  amiodarone (PACERONE) 200 MG tablet Take 1 tablet (200 mg total) by mouth every morning. 08/02/22 08/03/23  Corky Crafts, MD  ascorbic acid (VITAMIN C) 500 MG tablet Take 1,000 mg by mouth daily.    [provider]  aspirin EC 81 MG tablet Take 1 tablet (81 mg total) by mouth daily. Swallow whole. 04/12/23   Rai, Delene Ruffini, MD  atorvastatin (LIPITOR) 80 MG tablet TAKE ONE TABLET BY MOUTH AT BEDTIME. 03/22/23   Gaston Islam.,  NP  calcitRIOL (ROCALTROL) 0.25 MCG capsule Take 9 capsules (2.25 mcg total) by mouth every Tuesday, Thursday, and Saturday at 6 PM. 02/24/23   Danford, Earl Lites, MD  calcium acetate (PHOSLO) 667 MG capsule Take 2 capsules (1,334 mg total) by mouth 3 (three) times daily with meals. 05/04/21   Love, Evlyn Kanner, PA-C  carvedilol (COREG) 12.5 MG tablet Take 1 tablet (12.5 mg total) by mouth 2 (two) times daily. 02/23/23   Danford,  Earl Lites, MD  cinacalcet (SENSIPAR) 30 MG tablet Take 2 tablets (60 mg total) by mouth every Tuesday, Thursday, and Saturday at 6 PM. 02/24/23   Danford, Earl Lites, MD  dorzolamide-timolol (COSOPT) 2-0.5 % ophthalmic solution Place 1 drop into the right eye 2 (two) times daily. 11/15/22 11/15/23  [provider]  levETIRAcetam (KEPPRA) 500 MG tablet Take 1 tablet (500 mg total) by mouth 4 (four) times a week. On Sunday, Monday, Wednesday and Friday, all non-dialysis days 02/25/23   Alberteen Sam, MD  levETIRAcetam (KEPPRA) 750 MG tablet Take 1 tablet (750 mg total) by mouth 3 (three) times a week. On Tuesday Thursday and Saturday, on dialysis days 02/24/23   Alberteen Sam, MD  lidocaine-prilocaine (EMLA) cream Apply 1 Application topically 3 (three) times a week. 12/24/21   [provider]  loperamide (IMODIUM) 2 MG capsule Take 1 capsule (2 mg total) by mouth as needed for diarrhea or loose stools. 02/23/23   Danford, Earl Lites, MD  multivitamin (RENA-VIT) TABS tablet Take 1 tablet by mouth at bedtime. 04/12/23   Rai, Delene Ruffini, MD  nutrition supplement, JUVEN, (JUVEN) PACK Take 1 packet by mouth 2 (two) times daily between meals. 02/23/23   Danford, Earl Lites, MD  Nutritional Supplements (,FEEDING SUPPLEMENT, PROSOURCE PLUS) liquid Take 30 mLs by mouth 3 (three) times daily between meals. 02/23/23   Danford, Earl Lites, MD  ondansetron (ZOFRAN) 4 MG tablet Take 1 tablet (4 mg total) by mouth every 6 (six) hours as needed for nausea or  vomiting. 03/17/23   Al Decant, PA-C  oxyCODONE (OXY IR/ROXICODONE) 5 MG immediate release tablet Take 1 tablet (5 mg total) by mouth every 4 (four) hours as needed for moderate pain (pain score 4-6). 05/17/23   Dahal, Melina Schools, MD  polyethylene glycol (MIRALAX / GLYCOLAX) 17 g packet Take 17 g by mouth daily. 04/12/23   Rai, Delene Ruffini, MD  zinc sulfate, 50mg  elemental zinc, 220 (50 Zn) MG capsule Take 220 mg by mouth daily.    [provider]    Physical Exam: Vitals:   05/22/23 1410 05/22/23 1500 05/22/23 1600 05/22/23 1700  BP: 126/72 137/68 131/69 (!) 143/70  Pulse: (!) 58 (!) 58 (!) 58 (!) 58  Resp: 16 10 (!) 8 11  Temp:      TempSrc:      SpO2: 97% 96% 97% 97%  Weight:      Height:        Constitutional: NAD, calm, comfortable Vitals:   05/22/23 1410 05/22/23 1500 05/22/23 1600 05/22/23 1700  BP: 126/72 137/68 131/69 (!) 143/70  Pulse: (!) 58 (!) 58 (!) 58 (!) 58  Resp: 16 10 (!) 8 11  Temp:      TempSrc:      SpO2: 97% 96% 97% 97%  Weight:      Height:       Eyes: Left eye enucleation, blind. ENMT: Mucous membranes are moist.  Neck: normal, supple, no masses, no thyromegaly Respiratory: clear to auscultation bilaterally, no wheezing, no crackles. Normal respiratory effort. No accessory muscle use.  Cardiovascular: Regular rate and rhythm, no murmurs / rubs / gallops. No extremity edema. 2+ pedal pulses. No carotid bruits.  Abdomen: no tenderness, no masses palpated. No hepatosplenomegaly. Bowel sounds positive.  Musculoskeletal: no clubbing / cyanosis.  Right above-knee amputation, Skin:  Stump of right AKA sutures present and intact, skin appears clean and healing well, nontender, no erythema, no induration or discharge. Neurologic: Facial symmetry,  moving extremities spontaneously, speech fluent.Marland Kitchen  Psychiatric: Normal judgment and insight. Alert and oriented x 3. Normal mood.   Labs on Admission: I have personally reviewed following labs and imaging  studies  CBC: Recent Labs  Lab 05/16/23 1229 05/22/23 1239  WBC 10.0 11.8*  NEUTROABS 6.3 8.1*  HGB 7.3* 7.0*  HCT 23.9* 23.9*  MCV 75.2* 76.8*  PLT 216 286   Basic Metabolic Panel: Recent Labs  Lab 05/22/23 1239  NA 137  K 4.6  CL 102  CO2 22  GLUCOSE 89  BUN 110*  CREATININE 12.72*  CALCIUM 8.5*   GFR: Estimated Creatinine Clearance: 7.4 mL/min (A) (by C-G formula based on SCr of 12.72 mg/dL (H)). Liver Function Tests: Recent Labs  Lab 05/22/23 1239  AST 13*  ALT 8  ALKPHOS 78  BILITOT 0.7  PROT 6.3*  ALBUMIN 2.2*   CBG: Recent Labs  Lab 05/16/23 1126  GLUCAP 104*   Radiological Exams on Admission: DG Femur Min 2 Views Right Result Date: 05/22/2023 CLINICAL DATA:  Stump infection EXAM: RIGHT FEMUR 2 VIEWS COMPARISON:  05/05/2023 FINDINGS: 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 Sharp resection margin without erosion or periosteal elevation. No fracture or dislocation. Severe atherosclerotic calcifications. Soft tissue prominence of the distal stump. No soft tissue gas. 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 No radiographic evidence of osteomyelitis. Electronically Signed   By: Duanne Guess D.O.   On: 05/22/2023 14:47   DG Chest Port 1 View Result Date: 05/22/2023 CLINICAL DATA:  Weakness EXAM: PORTABLE CHEST 1 VIEW COMPARISON:  05/11/2023 FINDINGS: Stable cardiomegaly. Patchy right lower lobe opacity, improving. Small right pleural effusion, minimally improved. Left lung is clear. No pneumothorax. IMPRESSION: Patchy right lower lobe opacity, improving. Small right pleural effusion, minimally improved. Electronically Signed   By: Duanne Guess D.O.   On: 05/22/2023 12:49    EKG: Independently reviewed.  Sinus rhythm, rate 60, QTc 487.  No significant change from prior.  Assessment/Plan Principal Problem:   Generalized weakness Active Problems:   CAD/ H/o prior STEMI 02/2020  s/p DES to LAD   ESRD on hemodialysis (HCC)   Acute on chronic anemia   Chronic Grade 2 diastolic heart failure/EF 55 to 60%   Hx of AKA (above knee amputation), right (HCC)   Blindness of left eye   History of CVA (cerebrovascular accident) - with ICH while on plavix and Eliquis in 11-2021.   History of seizure   Diabetes mellitus, type II (HCC)    Assessment and Plan: * Generalized weakness Likely from symptomatic chronic anemia. -Consider transfusing 1 unit with dialysis tomorrow -Check TSH  Acute on chronic anemia Presenting with generalized weakness.  Was transfused with 3 units during recent hospitalization. Hgb- 7.  Discharge hemoglobin was 7.3.  Secondary to acute and chronic illness, and likely blood loss from recent right BKA revision.  Melena hematochezia or hematemesis. -Last colonoscopy 01/2023 poor prep, congested mucosa, internal and external hemorrhoids -Transfuse 1 unit PRBC with dialysis tomorrow -IV Protonix 40 daily -Recent anemia panel iron low at 43, TIBC low at 141, ferritin high at 820, normal iron saturation at 31.  Reflecting anemia of chronic disease.  ESRD on hemodialysis [facility]) Missed HD yesterday because 2 unable to transfer patient onto dialysis chair.  HD schedule Tuesday Thursday Saturday.  BUN 110.  Mental status intact.  Blood pressure stable.  Potassium 4.6. -Please consult nephrology in a.m. for HD -Consider transfusing 1  unit PRBC with HD  CAD/ H/o prior STEMI 02/2020 s/p DES to LAD Troponin elevated to 252 >> 261.  In the setting of missed HD and chronic anemia.  Patient denies chest pain, no dyspnea.  EKG without changes.  -Trend troponin -Resume aspirin, atorvastatin  Hx of AKA (above knee amputation), right [facility]) Require revision by Dr. Alvie Heidelberg.  3/22- Stump swelling was noted, sutures were released with large amount of blood noted.  Blood loss likely contributing to patient's chronic anemia.  Chronic Grade 2 diastolic heart failure/EF 55  to 60% Stable and compensated. -Per HD.  History of seizure Continue Keppra  Diabetes mellitus, type II (HCC) Not on medication or insulin. -Daily CBG  Paroxysmal atrial fibrillation, rate controlled, currently in sinus rhythm.  Not on anticoagulation due to history of ICH. -Resume amiodarone   DVT prophylaxis: Heparin Code Status: Full code Family Communication: None at bedside Disposition Plan: ~ 2 days Consults called: None Admission status: Inpt tele I certify that at the point of admission it is my clinical judgment that the patient will require inpatient hospital care spanning beyond 2 midnights from the point of admission due to high intensity of service, high risk for further deterioration and high frequency of surveillance required.   Author: Onnie Boer, MD 05/22/2023 6:55 PM  For on call review [email or web address].

## 2023-05-22 NOTE — Assessment & Plan Note (Signed)
 Not on medication or insulin. -Daily CBG

## 2023-05-22 NOTE — Assessment & Plan Note (Addendum)
 Troponin elevated to 252 >> 261.  In the setting of missed HD and chronic anemia.  Patient denies chest pain, no dyspnea.  EKG without changes.  -Trend troponin -Resume aspirin, atorvastatin

## 2023-05-22 NOTE — Progress Notes (Signed)
 Date and time results received: 05/22/23 2100 (use smartphrase ".now" to insert current time)  Test: troponin  Critical Value: 217  Name of Provider Notified: Emokpae   Orders Received? Or Actions Taken?:  none

## 2023-05-22 NOTE — Assessment & Plan Note (Signed)
 Missed HD yesterday because 2 unable to transfer patient onto dialysis chair.  HD schedule Tuesday Thursday Saturday.  BUN 110.  Mental status intact.  Blood pressure stable.  Potassium 4.6. -Please consult nephrology in a.m. for HD -Consider transfusing 1 unit PRBC with HD

## 2023-05-22 NOTE — Assessment & Plan Note (Addendum)
 Presenting with generalized weakness.  Was transfused with 3 units during recent hospitalization. Hgb- 7.  Discharge hemoglobin was 7.3.  Secondary to acute and chronic illness, and likely blood loss from recent right BKA revision.  Melena hematochezia or hematemesis. -Last colonoscopy 01/2023 poor prep, congested mucosa, internal and external hemorrhoids -Transfuse 1 unit PRBC with dialysis tomorrow -IV Protonix 40 daily -Recent anemia panel iron low at 43, TIBC low at 141, ferritin high at 820, normal iron saturation at 31.  Reflecting anemia of chronic disease.

## 2023-05-22 NOTE — ED Notes (Signed)
 Restricted limb wristband placed on patients left arm.

## 2023-05-23 DIAGNOSIS — R531 Weakness: Secondary | ICD-10-CM | POA: Diagnosis not present

## 2023-05-23 LAB — CBC
HCT: 25.8 % — ABNORMAL LOW (ref 39.0–52.0)
Hemoglobin: 7.5 g/dL — ABNORMAL LOW (ref 13.0–17.0)
MCH: 22.3 pg — ABNORMAL LOW (ref 26.0–34.0)
MCHC: 29.1 g/dL — ABNORMAL LOW (ref 30.0–36.0)
MCV: 76.6 fL — ABNORMAL LOW (ref 80.0–100.0)
Platelets: 281 10*3/uL (ref 150–400)
RBC: 3.37 MIL/uL — ABNORMAL LOW (ref 4.22–5.81)
RDW: 25.2 % — ABNORMAL HIGH (ref 11.5–15.5)
WBC: 10.3 10*3/uL (ref 4.0–10.5)
nRBC: 0 % (ref 0.0–0.2)

## 2023-05-23 LAB — GLUCOSE, CAPILLARY
Glucose-Capillary: 249 mg/dL — ABNORMAL HIGH (ref 70–99)
Glucose-Capillary: 47 mg/dL — ABNORMAL LOW (ref 70–99)

## 2023-05-23 LAB — BASIC METABOLIC PANEL WITH GFR
Anion gap: 16 — ABNORMAL HIGH (ref 5–15)
BUN: 114 mg/dL — ABNORMAL HIGH (ref 6–20)
CO2: 21 mmol/L — ABNORMAL LOW (ref 22–32)
Calcium: 9 mg/dL (ref 8.9–10.3)
Chloride: 104 mmol/L (ref 98–111)
Creatinine, Ser: 13.48 mg/dL — ABNORMAL HIGH (ref 0.61–1.24)
GFR, Estimated: 4 mL/min — ABNORMAL LOW (ref >60)
Glucose, Bld: 78 mg/dL (ref 70–99)
Potassium: 4.9 mmol/L (ref 3.5–5.1)
Sodium: 141 mmol/L (ref 135–145)

## 2023-05-23 LAB — TSH: TSH: 5.024 u[IU]/mL — ABNORMAL HIGH (ref 0.350–4.500)

## 2023-05-23 MED ORDER — CHLORHEXIDINE GLUCONATE CLOTH 2 % EX PADS
6.0000 | MEDICATED_PAD | Freq: Every day | CUTANEOUS | Status: DC
Start: 2023-05-23 — End: 2023-05-26
  Administered 2023-05-23 – 2023-05-26 (×4): 6 via TOPICAL

## 2023-05-23 NOTE — Plan of Care (Signed)
  Problem: Acute Rehab PT Goals(only PT should resolve) Goal: Pt Will Go Supine/Side To Sit Outcome: Progressing Flowsheets (Taken 05/23/2023 1149) Pt will go Supine/Side to Sit: with moderate assist Goal: Pt Will Go Sit To Supine/Side Outcome: Progressing Flowsheets (Taken 05/23/2023 1149) Pt will go Sit to Supine/Side: with moderate assist Goal: Patient Will Perform Sitting Balance Outcome: Progressing Flowsheets (Taken 05/23/2023 1149) Patient will perform sitting balance: with moderate assist Goal: Patient Will Transfer Sit To/From Stand Outcome: Progressing Flowsheets (Taken 05/23/2023 1149) Patient will transfer sit to/from stand: with maximum assist Goal: Pt Will Transfer Bed To Chair/Chair To Bed Outcome: Progressing Flowsheets (Taken 05/23/2023 1149) Pt will Transfer Bed to Chair/Chair to Bed: with max assist

## 2023-05-23 NOTE — TOC Initial Note (Signed)
 Transition of Care (TOC) - Initial/Assessment Note    Patient Details  Name: [name]. MRN: [id number] Date of Birth: 10-02-1970  Transition of Care [facility]) CM/SW Contact:    Karn Cassis, LCSW Phone Number: 05/23/2023, 12:01 PM  Clinical Narrative:  Pt admitted due to generalized weakness. Pt lives with his wife but she works during the day. Pt has recently been to [facility]. He is active with [facility]. Clydie Braun with [facility] notified of admission. PT evaluated pt and recommend SNF. LCSW discussed SNF, including Medicare.gov ratings. Pt agreeable to [place] or [place] placement. He goes to [facility] 1st shift. Will need authorization. CMA to start. LCSW will initiate bed search.                  Expected Discharge Plan: Skilled Nursing Facility Barriers to Discharge: Continued Medical Work up   Patient Goals and CMS Choice Patient states their goals for this hospitalization and ongoing recovery are:: SNF   Choice offered to / list presented to : Spouse [place] ownership interest in [facility].provided to:: Patient    Expected Discharge Plan and Services In-house Referral: Clinical Social Work   Post Acute Care Choice: Skilled Nursing Facility Living arrangements for the past 2 months: Single Family Home                                      Prior Living Arrangements/Services Living arrangements for the past 2 months: Single Family Home Lives with:: Spouse Patient language and need for interpreter reviewed:: Yes Do you feel safe going back to the place where you live?: Yes      Need for Family Participation in Patient Care: No (Comment)   Current home services: DME (wheelchair, cane) Criminal Activity/Legal Involvement Pertinent to Current Situation/Hospitalization: No - Comment as needed  Activities of Daily Living   ADL Screening (condition at time of admission) Independently performs ADLs?: No Does the  patient have a NEW difficulty with bathing/dressing/toileting/self-feeding that is expected to last >3 days?: Yes (Initiates electronic notice to provider for possible OT consult) Does the patient have a NEW difficulty with getting in/out of bed, walking, or climbing stairs that is expected to last >3 days?: Yes (Initiates electronic notice to provider for possible PT consult) Does the patient have a NEW difficulty with communication that is expected to last >3 days?: Yes (Initiates electronic notice to provider for possible SLP consult) Is the patient deaf or have difficulty hearing?: No Does the patient have difficulty seeing, even when wearing glasses/contacts?: No Does the patient have difficulty concentrating, remembering, or making decisions?: No  Permission Sought/Granted                  Emotional Assessment     Affect (typically observed): Appropriate Orientation: : Oriented to Self, Oriented to Place, Oriented to  Time, Oriented to Situation Alcohol / Substance Use: Not Applicable Psych Involvement: No (comment)  Admission diagnosis:  Weakness [R53.1] Elevated troponin [R79.89] Generalized weakness [R53.1] Ambulatory dysfunction [R26.2] Anemia, unspecified type [D64.9] Chronic kidney disease, unspecified CKD stage [N18.9] Patient Active Problem List   Diagnosis Date Noted   Generalized weakness 05/22/2023   Hx of AKA (above knee amputation), right (HCC) 05/11/2023   Pleural effusion 05/05/2023   Seizures (HCC) 05/05/2023   Right BKA infection (HCC) 04/04/2023   Pressure injury of skin 04/04/2023   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) 08/19/2020   Anxiety state 08/19/2020   Callosity 08/19/2020   ED (erectile dysfunction) of organic origin 08/19/2020   Long term (current) use of insulin (HCC) 08/19/2020   Myalgia 08/19/2020   CAD/ H/o prior STEMI 02/2020 s/p DES to LAD 08/19/2020   Blindness of left eye 08/15/2020   Combined forms of age-related cataract of right eye 04/17/2020   Moderate protein-calorie malnutrition (HCC) 05/14/2019   Disorder of phosphorus metabolism, unspecified 05/07/2019   Iron deficiency anemia, unspecified 05/07/2019   Secondary hyperparathyroidism of renal origin (HCC) 11/28/2018   Chronic Grade 2 diastolic heart failure/EF 55 to 60% 12/13/2017   Diabetes mellitus, type II (HCC) 11/23/2017   Diabetic retinopathy (HCC) 07/05/2017   Uncontrolled type 2 diabetes mellitus with hyperglycemia, with long-term current use of insulin (HCC) 04/20/2016   Mixed hyperlipidemia    Essential hypertension, benign    PCP:  [name], FNP Pharmacy:   [facility] - [place], [place] - [address] [place] [address] Phone: [phone] Fax: [phone]     Social Drivers of Health (SDOH) Social  History: SDOH Screenings   Food Insecurity: No Food Insecurity (05/22/2023)  Housing: High Risk (05/22/2023)  Transportation Needs: No Transportation Needs (05/22/2023)  Utilities: Not At Risk (05/22/2023)  Financial Resource Strain: High Risk (07/05/2017)  Physical Activity: Inactive (07/05/2017)  Social Connections: Socially Integrated (05/05/2023)  Stress: Stress Concern Present (07/05/2017)  Tobacco Use: Low Risk  (05/22/2023)  Health Literacy: Low Risk  (06/01/2020)   Received from [facility], [facility]   SDOH Interventions:     Readmission Risk Interventions    05/17/2023    3:44 PM 07/27/2021   11:49 AM 11/27/2020    3:06 PM  Readmission Risk Prevention Plan  Transportation Screening Complete Complete Complete  PCP or Specialist Appt within 3-5 Days   Complete  HRI or Home Care Consult   Complete  Social Work Consult for Recovery Care Planning/Counseling   Complete  Palliative Care Screening   Not Applicable  Medication Review [employer]) Referral to Pharmacy Complete Complete  PCP or Specialist appointment within 3-5 days of discharge Complete    HRI or Home Care Consult Complete    SW Recovery Care/Counseling Consult Complete Complete   Palliative Care Screening Not Applicable Not Applicable   Skilled Nursing Facility Not Applicable Not Applicable

## 2023-05-23 NOTE — Plan of Care (Signed)
  Problem: Acute Rehab OT Goals (only OT should resolve) Goal: Pt. Will Perform Grooming Flowsheets (Taken 05/23/2023 1703) Pt Will Perform Grooming:  with modified independence  sitting Goal: Pt. Will Perform Upper Body Bathing Flowsheets (Taken 05/23/2023 1703) Pt Will Perform Upper Body Bathing:  with modified independence  sitting Goal: Pt. Will Perform Upper Body Dressing Flowsheets (Taken 05/23/2023 1703) Pt Will Perform Upper Body Dressing:  with modified independence  sitting Goal: Pt. Will Perform Lower Body Dressing Flowsheets (Taken 05/23/2023 1703) Pt Will Perform Lower Body Dressing:  with set-up  sitting/lateral leans Goal: Pt. Will Transfer To Toilet Flowsheets (Taken 05/23/2023 1703) Pt Will Transfer to Toilet:  with mod assist  with transfer board Goal: Pt. Will Perform Toileting-Clothing Manipulation Flowsheets (Taken 05/23/2023 1703) Pt Will Perform Toileting - Clothing Manipulation and hygiene:  with set-up  bed level Goal: Pt/Caregiver Will Perform Home Exercise Program Flowsheets (Taken 05/23/2023 1703) Pt/caregiver will Perform Home Exercise Program:  Increased ROM  Increased strength  Both right and left upper extremity  Independently  Trent Gabler OT, MOT

## 2023-05-23 NOTE — Consult Note (Signed)
 [facility] Renal Consultation Note    Indication for Consultation:  Management of ESRD/hemodialysis; anemia, hypertension/volume and secondary hyperparathyroidism  HPI: [name]. is a 53 y.o. male with a past medical history of ESRD, PAD, right BKA, history of CVA, A-fib, DM2, CAD status post PCI, diastolic CHF, left eye blindness, hyperlipidemia, seizure disorder, chronic anemia who was recently admitted at [facility] from 3/13-3/25/25 after wound dehiscence from R AKA and seizure like activity during HD.  He was discharged to home and presented to [facility] on 05/22/23 c/o generalized weakness and fatigue.  In the ED, he was afebrile and VSS.  Labs unremarkable except for BUN of 110.  He did miss Hd on Saturday.  We were consulted to provide dialysis during his hospitalization.  Past Medical History:  Diagnosis Date   Acute Bil  CVAs of Bil Frontal amd Rt Parietal  in Watershed Distribution 04/21/2021   Patchy acute cortically-based infarcts within the bilateral high  frontal lobes and right parietal lobe (predominantly in a watershed  distribution).   Subcentimeter acute infarct within the callosal splenium on the  left.     Acute bilateral cerebral infarction in a watershed distribution [facility]) 04/21/2021   a.) MRI brain 04/21/2021 --> patchy ACUTE cortically-based infarcts within the bilateral high frontal lobes and right parietal lobe   Acute cerebral infarction (HCC) 02/13/2021   a.) MRI brain 02/13/2021: acute LEFT hippocampal infarct   Acute cerebral infarction (HCC) 04/21/2021   a.) MRI brain 04/21/2021: subcentimeter ACUTE infarct within the callosal splenium on the LEFT   Acute osteomyelitis of toe of right foot with gangrene (HCC) 02/26/2022   Anaphylactic reaction due to adverse effect of correct drug or medicament properly administered, initial encounter 05/08/2019   Anemia of chronic renal failure    Anxiety    Aortic dilatation (HCC) 11/25/2020   a.) TTE 11/25/2020:  Ao root measured 38 mm. b.) TTE 04/22/2021: Ao root measured 44 mm; c. 11/2021 Echo: Ao root 40mm.   Atrial fibrillation (HCC)    a.) CHA2DS2-VASc = 6 (CHF, HTN, CVA x2, prior MI, T2DM). b.) rate/rhythm maintained on oral amiodarone + carvedilol; chronic OAC/AP therapy discontinued following ICH   Benign prostatic hyperplasia with lower urinary tract symptoms 11/07/2019   BPH (benign prostatic hyperplasia)    Cardiac arrest (HCC) 07/26/2021   a.) in setting of hyperkalemia, NSTEMI, and seizure following missed HD; pulseless and apneic --> required 1 round of CPR prior to ROSC   Cellulitis    Cerebral hemorrhage (HCC) 11/21/2021   a.) CT head 11/21/2021 --> 1.4 x 1.5 x 3.1 cm ACUTE hemorrhage within the inferior aspect of the fourth ventricle, and extending inferiorly through the foramen of foramen of [place] --> occurred in setting of HTN emergency and DOAC/APT-->eliquis/plavix d/c'd.   Cerebral microvascular disease    Chronic diarrhea    Chronic heart failure with preserved ejection fraction (HFpEF) (HCC) 07/19/2017   a.) 06/2017 Echo: EF 75%; b.) 11/2020 Echo: EF 55-60%; c.) 04/2021 Echo: EF 55-60%; d.) 11/2021 Echo: EF 65-70%, GrII DD, nl RV fxn, sev dil LA, large circumferential pericardial eff w/o tamponade, triv MR, AoV sclerosis. Ao root 40mm; e.) TTE 02/28/2025: EF 60-65%, sev LVH, mod posterior LV effusion, AoV sclerosis   Chronic pain of both ankles    Coronary artery disease 02/26/2020   a. 02/2020 Ant STEMI/PCI: LM nl, LAD 48m (3.5x30 Resolute Onyx), RI nl, LCX min irregs, RCA min irregs. EF 65%.   DDD (degenerative disc disease), cervical  Diabetes mellitus, type II (HCC)    Diabetic neuropathy (HCC)    Esophagitis    ESRD (end stage renal disease) on dialysis (HCC)    a.) [facility]; T-Th-Sat   Frequent falls    Gait instability    Gastritis    Gastroparesis    GERD (gastroesophageal reflux disease)    H/O enucleation of left eyeball    Heart palpitations    History of kidney  stones    HLD (hyperlipidemia)    Hypertension    Insomnia    a.) uses melatonin PRN   Long term current use of amiodarone    Nausea and vomiting in adult    recurrent   NSTEMI (non-ST elevated myocardial infarction) (HCC) 07/26/2021   a.) hyperkalemic from missed HD; seizures; decompensated to cardiac arrest and required CPR x 1 round prior to ROSC.   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  Added automatically from request for surgery [id number]     Osteomyelitis [facility])    Pericardial effusion    a. 11/2021 Echo: EF 65-70%, GrII DD, nl RV fxn, sev dil LA, large circumferential pericardial eff w/o tamponade, triv MR, AoV sclerosis. Ao root 40mm.   Seizure (HCC)    a.) last 07/26/2021 in setting of missed HD --> hyperkalemic at 6.5 --> pulseless/apneic and required CPR; discharged home on levetiracetam.   ST elevation myocardial infarction (STEMI) of anterior wall (HCC) 02/26/2020   a.) LHC/PCI 02/26/2020 --> EF 65%; LVEDP 11 mmHg; 90% mLAD (3.5 x 20 mm Resolute Onyx DES x 1)   Past Surgical History:  Procedure Laterality Date   A/V FISTULAGRAM Left 08/31/2021   Procedure: A/V Fistulagram;  Surgeon: Annice Needy, MD;  Location: [name] INVASIVE CV LAB;  Service: Cardiovascular;  Laterality: Left;   A/V FISTULAGRAM Left 01/25/2022   Procedure: A/V Fistulagram;  Surgeon: Annice Needy, MD;  Location: [name] INVASIVE CV LAB;  Service: Cardiovascular;  Laterality: Left;   A/V FISTULAGRAM Right 10/18/2022   Procedure: A/V Fistulagram;  Surgeon: Annice Needy, MD;  Location: [name] INVASIVE CV LAB;  Service: Cardiovascular;  Laterality: Right;   ABDOMINAL AORTOGRAM W/LOWER EXTREMITY Bilateral 02/08/2023   Procedure: ABDOMINAL AORTOGRAM W/LOWER EXTREMITY;  Surgeon: Daria Pastures, MD;  Location: [name] INVASIVE CV LAB;  Service: Vascular;  Laterality: Bilateral;   AMPUTATION Left 03/16/2016   Procedure: AMPUTATION DIGIT  LEFT HALLUX;  Surgeon: Vivi Barrack, DPM;  Location: [facility] OR;  Service: Podiatry;  Laterality: Left;  can start around 5    AMPUTATION Right 02/09/2021   Procedure: AMPUTATION DIGIT;  Surgeon: Marlyne Beards, MD;  Location: [facility] OR;  Service: Orthopedics;  Laterality: Right;   AMPUTATION Right 02/04/2023   Procedure: AMPUTATION 4TH AND 5TH METATARSAL;  Surgeon: Pilar Plate, DPM;  Location: [facility] OR;  Service: Orthopedics/Podiatry;  Laterality: Right;  I&D, partial ray resection, abx beads   AMPUTATION Right 02/09/2023   Procedure: RIGHT BELOW KNEE AMPUTATION;  Surgeon: [name], MD;  Location: [facility] OR;  Service: Orthopedics;  Laterality: Right;   AMPUTATION Right 04/06/2023   Procedure: RIGHT ABOVE KNEE AMPUTATION;  Surgeon: [name], MD;  Location: [facility] OR;  Service: Orthopedics;  Laterality: Right;   AMPUTATION Right 05/11/2023   Procedure: RIGHT ABOVE KNEE AMPUTATION;  Surgeon: [name], MD;  Location: [facility] OR;  Service: Orthopedics;  Laterality: Right;  REVISION RIGHT ABOVE KNEE AMPUTATION   AMPUTATION TOE Right 02/28/2022  Procedure: AMPUTATION TOE;  Surgeon: Louann Sjogren, DPM;  Location: [name] ORS;  Service: Podiatry;  Laterality: Right;  right fifth toe   ARTHROSCOPIC REPAIR ACL Left    AV FISTULA PLACEMENT Right 05/31/2019   Procedure: Brachiocephalic AV fistula creation;  Surgeon: Annice Needy, MD;  Location: [name] ORS;  Service: Vascular;  Laterality: Right;   AV FISTULA PLACEMENT Left 08/13/2021   Procedure: ARTERIOVENOUS (AV) FISTULA CREATION (BRACHIALCEPHALIC);  Surgeon: Annice Needy, MD;  Location: [name] ORS;  Service: Vascular;  Laterality: Left;   CHEST TUBE INSERTION Right 05/06/2023   Procedure: CHEST TUBE INSERTION;  Surgeon: Lorin Glass, MD;  Location: [facility] ENDOSCOPY;  Service: Pulmonary;  Laterality: Right;   COLONOSCOPY WITH PROPOFOL N/A 10/28/2015   Procedure: COLONOSCOPY WITH PROPOFOL;  Surgeon: Christena Deem, MD;  Location: [facility] ENDOSCOPY;   Service: Endoscopy;  Laterality: N/A;   COLONOSCOPY WITH PROPOFOL N/A 10/29/2015   Procedure: COLONOSCOPY WITH PROPOFOL;  Surgeon: Christena Deem, MD;  Location: [facility] ENDOSCOPY;  Service: Endoscopy;  Laterality: N/A;   COLONOSCOPY WITH PROPOFOL N/A 01/27/2023   Procedure: COLONOSCOPY WITH PROPOFOL;  Surgeon: Franky Macho, MD;  Location: [facility] ENDO SUITE;  Service: Endoscopy;  Laterality: N/A;  12:30pm, asa 3   CORONARY ULTRASOUND/IVUS N/A 02/26/2020   Procedure: Intravascular Ultrasound/IVUS;  Surgeon: Corky Crafts, MD;  Location: [facility] INVASIVE CV LAB;  Service: Cardiovascular;  Laterality: N/A;   CORONARY/GRAFT ACUTE MI REVASCULARIZATION N/A 02/26/2020   Procedure: Coronary/Graft Acute MI Revascularization;  Surgeon: Corky Crafts, MD;  Location: [facility] INVASIVE CV LAB;  Service: Cardiovascular;  Laterality: N/A;   DIALYSIS/PERMA CATHETER INSERTION Right 04/26/2019   Perm Cath    DIALYSIS/PERMA CATHETER INSERTION N/A 04/26/2019   Procedure: DIALYSIS/PERMA CATHETER INSERTION;  Surgeon: Annice Needy, MD;  Location: [name] INVASIVE CV LAB;  Service: Cardiovascular;  Laterality: N/A;   DIALYSIS/PERMA CATHETER INSERTION N/A 05/25/2021   Procedure: DIALYSIS/PERMA CATHETER INSERTION;  Surgeon: Annice Needy, MD;  Location: [name] INVASIVE CV LAB;  Service: Cardiovascular;  Laterality: N/A;   DIALYSIS/PERMA CATHETER INSERTION N/A 08/31/2021   Procedure: DIALYSIS/PERMA CATHETER INSERTION;  Surgeon: Annice Needy, MD;  Location: [name] INVASIVE CV LAB;  Service: Cardiovascular;  Laterality: N/A;   DIALYSIS/PERMA CATHETER REMOVAL N/A 08/15/2019   Procedure: DIALYSIS/PERMA CATHETER REMOVAL;  Surgeon: Renford Dills, MD;  Location: [name] INVASIVE CV LAB;  Service: Cardiovascular;  Laterality: N/A;   DIALYSIS/PERMA CATHETER REMOVAL N/A 03/15/2022   Procedure: DIALYSIS/PERMA CATHETER REMOVAL;  Surgeon: Annice Needy, MD;  Location: [name] INVASIVE CV LAB;  Service: Cardiovascular;  Laterality: N/A;    EMBOLIZATION (CATH LAB) Right 02/06/2021   Procedure: EMBOLIZATION;  Surgeon: Annice Needy, MD;  Location: [name] INVASIVE CV LAB;  Service: Cardiovascular;  Laterality: Right;  Right Upper Extremity Dialysis Access, Permcath Placement   ESOPHAGOGASTRODUODENOSCOPY (EGD) WITH PROPOFOL N/A 12/27/2017   Procedure: ESOPHAGOGASTRODUODENOSCOPY (EGD) WITH PROPOFOL;  Surgeon: Toledo, Boykin Nearing, MD;  Location: [name] ENDOSCOPY;  Service: Gastroenterology;  Laterality: N/A;   EYE SURGERY     IR THORACENTESIS ASP PLEURAL SPACE W/IMG GUIDE  05/05/2023   LEFT HEART CATH AND CORONARY ANGIOGRAPHY N/A 02/26/2020   Procedure: LEFT HEART CATH AND CORONARY ANGIOGRAPHY;  Surgeon: Corky Crafts, MD;  Location: [facility] INVASIVE CV LAB;  Service: Cardiovascular;  Laterality: N/A;   PROSTATE SURGERY  2016   REVISON OF ARTERIOVENOUS FISTULA Right 07/22/2022   Procedure: RESECTION OF ANEURYSMAL RIGHT ARM ARTERIOVENOUS FISTULA;  Surgeon: Annice Needy, MD;  Location: [name] ORS;  Service:  Vascular;  Laterality: Right;   TONSILECTOMY/ADENOIDECTOMY WITH MYRINGOTOMY     TONSILLECTOMY     UPPER EXTREMITY ANGIOGRAPHY Right 11/26/2020   Procedure: Upper Extremity Angiography;  Surgeon: Annice Needy, MD;  Location: [name] INVASIVE CV LAB;  Service: Cardiovascular;  Laterality: Right;   UPPER EXTREMITY ANGIOGRAPHY Right 02/05/2021   Procedure: UPPER EXTREMITY ANGIOGRAPHY;  Surgeon: Annice Needy, MD;  Location: [name] INVASIVE CV LAB;  Service: Cardiovascular;  Laterality: Right;   Family History:   Family History  Problem Relation Age of Onset   CAD Father    Stroke Father    Diabetes Mellitus II Mother    Kidney failure Mother    Schizophrenia Mother    Social History:  reports that he has never smoked. He has never used smokeless tobacco. He reports that he does not drink alcohol and does not use drugs. Allergies  Allergen Reactions   Promethazine Diarrhea and Other (See Comments)    Muscle cramps, cramping "all over"   Prior  to Admission medications   Medication Sig Start Date End Date Taking? Authorizing Provider  acetaminophen (TYLENOL) 325 MG tablet Take 1-2 tablets (325-650 mg total) by mouth every 4 (four) hours as needed for mild pain. 05/04/21   Love, Evlyn Kanner, PA-C  amiodarone (PACERONE) 200 MG tablet Take 1 tablet (200 mg total) by mouth every morning. 08/02/22 08/03/23  Corky Crafts, MD  ascorbic acid (VITAMIN C) 500 MG tablet Take 1,000 mg by mouth daily.    [provider]  aspirin EC 81 MG tablet Take 1 tablet (81 mg total) by mouth daily. Swallow whole. 04/12/23   Rai, Delene Ruffini, MD  atorvastatin (LIPITOR) 80 MG tablet TAKE ONE TABLET BY MOUTH AT BEDTIME. 03/22/23   Gaston Islam., NP  calcitRIOL (ROCALTROL) 0.25 MCG capsule Take 9 capsules (2.25 mcg total) by mouth every Tuesday, Thursday, and Saturday at 6 PM. 02/24/23   Danford, Earl Lites, MD  calcium acetate (PHOSLO) 667 MG capsule Take 2 capsules (1,334 mg total) by mouth 3 (three) times daily with meals. 05/04/21   Love, Evlyn Kanner, PA-C  carvedilol (COREG) 12.5 MG tablet Take 1 tablet (12.5 mg total) by mouth 2 (two) times daily. 02/23/23   Alberteen Sam, MD  cinacalcet (SENSIPAR) 30 MG tablet Take 2 tablets (60 mg total) by mouth every Tuesday, Thursday, and Saturday at 6 PM. 02/24/23   Danford, Earl Lites, MD  dorzolamide-timolol (COSOPT) 2-0.5 % ophthalmic solution Place 1 drop into the right eye 2 (two) times daily. 11/15/22 11/15/23  [provider]  levETIRAcetam (KEPPRA) 500 MG tablet Take 1 tablet (500 mg total) by mouth 4 (four) times a week. On Sunday, Monday, Wednesday and Friday, all non-dialysis days 02/25/23   Alberteen Sam, MD  levETIRAcetam (KEPPRA) 750 MG tablet Take 1 tablet (750 mg total) by mouth 3 (three) times a week. On Tuesday Thursday and Saturday, on dialysis days 02/24/23   Alberteen Sam, MD  lidocaine-prilocaine (EMLA) cream Apply 1 Application topically 3 (three) times a week.  12/24/21   [provider]  loperamide (IMODIUM) 2 MG capsule Take 1 capsule (2 mg total) by mouth as needed for diarrhea or loose stools. 02/23/23   Danford, Earl Lites, MD  multivitamin (RENA-VIT) TABS tablet Take 1 tablet by mouth at bedtime. 04/12/23   Rai, Delene Ruffini, MD  nutrition supplement, JUVEN, (JUVEN) PACK Take 1 packet by mouth 2 (two) times daily between meals. 02/23/23   Danford, Earl Lites,  MD  Nutritional Supplements (,FEEDING SUPPLEMENT, PROSOURCE PLUS) liquid Take 30 mLs by mouth 3 (three) times daily between meals. 02/23/23   Danford, Earl Lites, MD  ondansetron (ZOFRAN) 4 MG tablet Take 1 tablet (4 mg total) by mouth every 6 (six) hours as needed for nausea or vomiting. 03/17/23   Al Decant, PA-C  oxyCODONE (OXY IR/ROXICODONE) 5 MG immediate release tablet Take 1 tablet (5 mg total) by mouth every 4 (four) hours as needed for moderate pain (pain score 4-6). 05/17/23   Dahal, Melina Schools, MD  polyethylene glycol (MIRALAX / GLYCOLAX) 17 g packet Take 17 g by mouth daily. 04/12/23   Rai, Delene Ruffini, MD  zinc sulfate, 50mg  elemental zinc, 220 (50 Zn) MG capsule Take 220 mg by mouth daily.    [provider]   Current Facility-Administered Medications  Medication Dose Route Frequency Provider Last Rate Last Admin   (feeding supplement) PROSource Plus liquid 30 mL  30 mL Oral TID BM Emokpae, Ejiroghene E, MD   30 mL at 05/23/23 0830   acetaminophen (TYLENOL) tablet 650 mg  650 mg Oral Q6H PRN Emokpae, Ejiroghene E, MD   650 mg at 05/22/23 2048   Or   acetaminophen (TYLENOL) suppository 650 mg  650 mg Rectal Q6H PRN Emokpae, Ejiroghene E, MD       amiodarone (PACERONE) tablet 200 mg  200 mg Oral BH-q7a Emokpae, Ejiroghene E, MD   200 mg at 05/23/23 4098   aspirin EC tablet 81 mg  81 mg Oral Daily Emokpae, Ejiroghene E, MD   81 mg at 05/23/23 0831   atorvastatin (LIPITOR) tablet 80 mg  80 mg Oral QHS Emokpae, Ejiroghene E, MD   80 mg at 05/22/23 2046   [START ON  05/24/2023] calcitRIOL (ROCALTROL) capsule 2.25 mcg  2.25 mcg Oral Q T,Th,Sat-1800 Emokpae, Ejiroghene E, MD       calcium acetate (PHOSLO) capsule 1,334 mg  1,334 mg Oral TID WC Emokpae, Ejiroghene E, MD   1,334 mg at 05/23/23 0830   carvedilol (COREG) tablet 12.5 mg  12.5 mg Oral BID Emokpae, Ejiroghene E, MD   12.5 mg at 05/23/23 0831   [START ON 05/24/2023] cinacalcet (SENSIPAR) tablet 60 mg  60 mg Oral Q T,Th,Sat-1800 Emokpae, Ejiroghene E, MD       heparin injection 5,000 Units  5,000 Units Subcutaneous Q8H Emokpae, Ejiroghene E, MD   5,000 Units at 05/23/23 1191   levETIRAcetam (KEPPRA) tablet 500 mg  500 mg Oral Once per day on Sunday Tuesday Thursday Saturday Mariea Clonts, Ejiroghene E, MD   500 mg at 05/22/23 2046   levETIRAcetam (KEPPRA) tablet 750 mg  750 mg Oral Once per day on Monday Wednesday Friday Emokpae, Ejiroghene E, MD   750 mg at 05/23/23 0830   loperamide (IMODIUM) capsule 2 mg  2 mg Oral PRN Emokpae, Ejiroghene E, MD       multivitamin (RENA-VIT) tablet 1 tablet  1 tablet Oral QHS Emokpae, Ejiroghene E, MD   1 tablet at 05/22/23 2045   ondansetron (ZOFRAN) tablet 4 mg  4 mg Oral Q6H PRN Emokpae, Ejiroghene E, MD       Or   ondansetron (ZOFRAN) injection 4 mg  4 mg Intravenous Q6H PRN Emokpae, Ejiroghene E, MD       oxyCODONE (Oxy IR/ROXICODONE) immediate release tablet 5 mg  5 mg Oral Q4H PRN Emokpae, Ejiroghene E, MD       polyethylene glycol (MIRALAX / GLYCOLAX) packet 17 g  17 g Oral  Daily PRN Onnie Boer, MD       Labs: Basic Metabolic Panel: Recent Labs  Lab 05/22/23 1239 05/23/23 0446  NA 137 141  K 4.6 4.9  CL 102 104  CO2 22 21*  GLUCOSE 89 78  BUN 110* 114*  CREATININE 12.72* 13.48*  CALCIUM 8.5* 9.0   Liver Function Tests: Recent Labs  Lab 05/22/23 1239  AST 13*  ALT 8  ALKPHOS 78  BILITOT 0.7  PROT 6.3*  ALBUMIN 2.2*   No results for input(s): "LIPASE", "AMYLASE" in the last 168 hours. No results for input(s): "AMMONIA" in the last 168  hours. CBC: Recent Labs  Lab 05/16/23 1229 05/22/23 1239 05/23/23 0446  WBC 10.0 11.8* 10.3  NEUTROABS 6.3 8.1*  --   HGB 7.3* 7.0* 7.5*  HCT 23.9* 23.9* 25.8*  MCV 75.2* 76.8* 76.6*  PLT 216 286 281   Cardiac Enzymes: No results for input(s): "CKTOTAL", "CKMB", "CKMBINDEX", "TROPONINI" in the last 168 hours. CBG: Recent Labs  Lab 05/16/23 1126 05/22/23 1834 05/22/23 2148 05/23/23 0746 05/23/23 0810  GLUCAP 104* 72 106* 47* 249*   Iron Studies: No results for input(s): "IRON", "TIBC", "TRANSFERRIN", "FERRITIN" in the last 72 hours. Studies/Results: DG Femur Min 2 Views Right Result Date: 05/22/2023 CLINICAL DATA:  Stump infection EXAM: RIGHT FEMUR 2 VIEWS COMPARISON:  05/05/2023 FINDINGS: 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 Sharp resection margin without erosion or periosteal elevation. No fracture or dislocation. Severe atherosclerotic calcifications. Soft tissue prominence of the distal stump. No soft tissue gas. 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 No radiographic evidence of osteomyelitis. Electronically Signed   By: Duanne Guess D.O.   On: 05/22/2023 14:47   DG Chest Port 1 View Result Date: 05/22/2023 CLINICAL DATA:  Weakness EXAM: PORTABLE CHEST 1 VIEW COMPARISON:  05/11/2023 FINDINGS: Stable cardiomegaly. Patchy right lower lobe opacity, improving. Small right pleural effusion, minimally improved. Left lung is clear. No pneumothorax. IMPRESSION: Patchy right lower lobe opacity, improving. Small right pleural effusion, minimally improved. Electronically Signed   By: Duanne Guess D.O.   On: 05/22/2023 12:49    ROS: Pertinent items are noted in HPI. Physical Exam: Vitals:   05/22/23 2144 05/23/23 0217 05/23/23 0523 05/23/23 0750  BP: 139/65 123/66 113/63 132/60  Pulse: (!) 58 (!) 58 (!) 58 (!) 58  Resp: 14 16 16 20   Temp: 98.7 F (37.1 C) 98.1 F (36.7 C) 98 F (36.7 C)    TempSrc: Oral Oral Oral   SpO2: 98% 99% 96%   Weight:      Height:          Weight change:   Intake/Output Summary (Last 24 hours) at 05/23/2023 0844 Last data filed at 05/23/2023 0428 Gross per 24 hour  Intake 480 ml  Output --  Net 480 ml   BP 132/60   Pulse (!) 58   Temp 98 F (36.7 C) (Oral)   Resp 20   Ht 6\' 3"  (1.905 m)   Wt 72.2 kg   SpO2 96%   BMI 19.90 kg/m  General appearance: alert, cooperative, and no distress Head: Normocephalic, without obvious abnormality, atraumatic Resp: clear to auscultation bilaterally Cardio: regular rate and rhythm, S1, S2 normal, no murmur, click, rub or gallop GI: soft, non-tender; bowel sounds normal; no masses,  no organomegaly Extremities: s/p RAKA, no edema, LUE AVF +T/B Dialysis Access:  Dialysis Orders: Center: [facility]. 3 hours 15 minutes.  EDW: 77.5 kg (however likely well under this as per outpatient unit). LUE AVF, 15-gauge needles. Flow rates: 400/800. 37 degrees. 2K/2.5 calcium. UF profile #2. Heparin: Loading 1000 units, 400 units/h. Meds: Mircera 100 mcg every 2 weeks (last dose 3/4)   Assessment/Plan:  Generalized weakness - ongoing issue.  Was not able to find SNG placement.  Plan per primary svc  ESRD -  will plan for HD tomorrow.  Cannot accommodate today due to high HD census  Hypertension/volume  - stable  Anemia  - continue with ESA and transfuse for Hgb <7  Metabolic bone disease -   continue with home meds  Nutrition -  renal diet, carb modified   DM type 2 - per primary svc  CAD - stable S/p revision of right AKA - per primary HFpEF- appears euvolemic Seizure disorder - continue with Keppra.  Irena Cords, MD [facility], [facility]  05/23/2023, 8:44 AM

## 2023-05-23 NOTE — Progress Notes (Signed)
 [facility] PROGRESS NOTE  Jaymir Struble. (DOB: 01/04/1971) NGE:[id number] PCP: [name], FNP Outpatient Specialists: Orthopedics, [name]  Brief Narrative: Kao Conry. is a 53 y.o. male with a history of ESRD (HD on TTS thru LUA AVF), PAD s/p multiple amputations including right AKA s/p recent revision 3/19, history of CVA, CAD s/p PCI, T2DM, PAF, HLD, chronic HFpEF, seizure disorder, and recent admission requiring chest tube for parapneumonia pleural effusion who presented to the ED on 05/22/2023 with weakness. Of note, he had been admitted at [facility] 3/13 - 3/25 and despite having SNF recommended and a peer to peer completed, his insurance did not authorize SNF placement, so he was sent home with maximal home health services. Here he was found to have hgb 7g/dl without active bleeding, BUN 110, he had missed HD 3/29 due to weakness. CXR showed improving RLL opacity.   Subjective: Feels diffusely weak, did not do well at home, no bleeding now or recently, no other new complaints physically. He would like an unrestricted diet.   Objective: BP 132/60   Pulse (!) 58   Temp 98 F (36.7 C) (Oral)   Resp 20   Ht 6\' 3"  (1.905 m)   Wt 72.2 kg   SpO2 96%   BMI 19.90 kg/m   Gen: No acute distress, chronically ill-appearing Pulm: Clear, nonlabored  CV: RRR, no MRG GI: Soft, NT, ND, +BS  Neuro: Alert and oriented. No new focal deficits. Ext: Warm, dry, right AKA dressing c/d/ic Left heel dressing c/d/ic no other new wounds. There are several digit amputations on right hand and left foot. +thrill LUA AVF.  Assessment & Plan: Generalized weakness: Likely from deconditioning from recent prolonged hospitalization. No focal deficits.  - Needs PT/OT at rehab/SNF. Will repeat PT/OT consults.    Acute on chronic anemia of ESRD and AOCD and ABLA s/p AKA revision: Received 3u RBCs during last hospitalization, DC hgb 7.3, was 7.0 on presentation to ED. No ongoing bleeding. Last  colonoscopy 01/2023 poor prep, congested mucosa, internal and external hemorrhoids - Continue empiric PPI and monitor CBC, consider 1u RBCs with dialysis depending on trend.    ESRD:  - Missed HD 3/29 due to weakness, nephrology consulted to resume dialysis. BUN is 110 though he has no signs/symptoms of uremia.  -Consider transfusing 1 unit PRBC with HD   CAD/ H/o prior STEMI 02/2020 s/p DES to LAD: Troponin elevated to 252 >> 261.  In the setting of missed HD and chronic anemia.  Patient denies chest pain, no dyspnea.  EKG without ischemic changes. This is consistent with demand myocardial ischemia.  - Resume aspirin, atorvastatin   Hx right AKA: s/p revision by Dr. Alvie Heidelberg. 3/22- Stump swelling was noted, sutures were released with large amount of blood noted.  Blood loss likely contributing to patient's chronic anemia. - Routine wound care and orthopedics follow up.    Chronic HFpEF: G2DD, LVEF 55-60% on last echo.  - Volume management per HD    History of seizure:  - Continue keppra    T2DM: Diet-controlled.  - Monitor glucose daily.     PAF: NSR on admission. Not on anticoagulation due to history of ICH. - Continue rhythm control with amiodarone  Elevated TSH: Very mild.  - Suggest recheck after convalescence.   Tyrone Nine, MD [facility] [email or web address] 05/23/2023, 12:31 PM

## 2023-05-23 NOTE — Evaluation (Signed)
 Physical Therapy Evaluation Patient Details Name: [name]. MRN: [id number] DOB: [birth year] Today's Date: 05/23/2023  History of Present Illness  [name]. is a 53 y.o. male with medical history significant for ESRD on dialysis T Th Sat, right above-knee amputation, hypertension, seizure, paroxysmal A-fib, blind in left eye, diastolic CHF.  Patient presented to the ED with complaints of generalized weakness over the past few days.  He went for dialysis yesterday, he was not dialyzed because were unable to transfer him onto the dialysis chair, patient reports he left his 'lift" at home.  2 episodes of vomiting 2 days ago but no blood. Chronic diarrhea that is unchanged.  No black stools no bloody stools. . No chest pain, no cough, no chest pain or difficulty breathing.     Hospitalized 3/13- 3/25 for all effusion, he underwent thoracentesis by IR with drainage of 1.5 L of fluid thought to be parapneumonic effusion trauma related.  Subsequent CT showed very large persistent right effusion.  Patient had pigtail chest tube placed by pulmonology with improved drainage, and then had light 6 infused per chest tube with resolution of oral effusion.  Cytology did not show malignant cells.  He also had right AKA stump wound dehiscence and underwent right AKA revision 8/19.  Subsequently large amount of blood collected in the stump that was when sutures were released- 3/22.  Received total of 3 units PRBC during specialization.    Clinical Impression  Patient lying in bed on therapist arrival.  He expresses frustration generally at pain right leg.  Patient needs max assist for supine to sit and needs mod to max assist to maintain sitting up.  Patient fatigues quickly and needs max assist to return to supine.  Pt demonstrating significant functional limitations in mobility, transfers, ambulation and ADLs due to muscle weakness, deconditioning, anxiety and pain. Based upon these  deficits/impairments, patient will benefit from continued skilled physical therapy services during remainder of hospital stay and at the next recommended venue of care to address deficits and promote return to optimal function.   Patient left in bed with call button in reach, bed alarm set and nursing notified of mobility status and that dressing on R AKA has come off; sutures intact.            If plan is discharge home, recommend the following: A lot of help with walking and/or transfers;A lot of help with bathing/dressing/bathroom;Assistance with cooking/housework;Assist for transportation   Can travel by private vehicle   No    Equipment Recommendations None recommended by PT  Recommendations for Other Services       Functional Status Assessment Patient has had a recent decline in their functional status and demonstrates the ability to make significant improvements in function in a reasonable and predictable amount of time.     Precautions / Restrictions Precautions Precautions: Fall Recall of Precautions/Restrictions: Intact Precaution/Restrictions Comments: R AKA; sutures remain Restrictions Weight Bearing Restrictions Per Provider Order: No Other Position/Activity Restrictions: also has left foot wrapped      Mobility  Bed Mobility Overal bed mobility: Needs Assistance Bed Mobility: Supine to Sit, Sit to Supine Rolling: Max assist, Mod assist   Supine to sit: Max assist Sit to supine: Max assist   General bed mobility comments: patient unable to maintain sitting balance without mod to max assist; fatigues quicly with sitting up Patient Response: Anxious (expresses frustration)  Transfers  General transfer comment: did not attempt transfer    Ambulation/Gait               General Gait Details: currently not ambulatory  Stairs            Wheelchair Mobility     Tilt Bed Tilt Bed Patient Response: Anxious (expresses  frustration)  Modified Rankin (Stroke Patients Only)       Balance Overall balance assessment: Needs assistance Sitting-balance support: Bilateral upper extremity supported Sitting balance-Leahy Scale: Poor Sitting balance - Comments: unable to maintain sitting balance without mod to max A today       Standing balance comment: unable                             Pertinent Vitals/Pain Pain Assessment Pain Assessment: 0-10 Pain Score: 8  Pain Location: R AKA revision Pain Intervention(s): Limited activity within patient's tolerance, Monitored during session    Home Living Family/patient expects to be discharged to:: Skilled nursing facility Living Arrangements: Spouse/significant other Available Help at Discharge: Family;Available PRN/intermittently Type of Home: House Home Access: Ramped entrance;Stairs to enter Entrance Stairs-Rails: Right;Left;Can reach both Entrance Stairs-Number of Steps: 3   Home Layout: One level Home Equipment: Rollator (4 wheels);Rolling Walker (2 wheels);BSC/3in1      Prior Function Prior Level of Function : Needs assist       Physical Assist : Mobility (physical);ADLs (physical)   ADLs (physical): IADLs;Bathing;Dressing Mobility Comments: 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 Has been at SNF, progressing to standing with therapy but has not completed full stand yet. Staff use hoyer lift for transfers and assist for sitting balance. "I can't walk" ADLs Comments: Staff help with BADL's     Extremity/Trunk Assessment   Upper Extremity Assessment Upper Extremity Assessment: Generalized weakness;Left hand dominant    Lower Extremity Assessment RLE Deficits / Details: R AKA dressing has come off; sutures in place; nursing notified RLE: Unable to fully assess due to pain LLE Deficits / Details: L heel wrapped with gauze       Communication   Communication Communication: No apparent  difficulties    Cognition Arousal: Alert Behavior During Therapy: WFL for tasks assessed/performed, Agitated   PT - Cognitive impairments: Problem solving, Safety/Judgement                       PT - Cognition Comments: reports frustration Following commands: Intact       Cueing Cueing Techniques: Verbal cues, Tactile cues     General Comments General comments (skin integrity, edema, etc.): wrapping has come off right AKA; sutures in place; nursing notified    Exercises     Assessment/Plan    PT Assessment Patient needs continued PT services  PT Problem List Decreased strength;Decreased activity tolerance;Decreased balance;Decreased mobility;Decreased knowledge of use of DME;Decreased cognition;Decreased safety awareness;Decreased knowledge of precautions;Pain;Decreased skin integrity       PT Treatment Interventions DME instruction;Gait training;Functional mobility training;Therapeutic activities;Therapeutic exercise;Balance training;Patient/family education;Neuromuscular re-education;Wheelchair mobility training    PT Goals (Current goals can be found in the Care Plan section)  Acute Rehab PT Goals Patient Stated Goal: return home after rehab PT Goal Formulation: With patient Time For Goal Achievement: 06/06/23 Potential to Achieve Goals: Fair    Frequency Min 3X/week     Co-evaluation               AM-PAC PT "6  Clicks" Mobility  Outcome Measure Help needed turning from your back to your side while in a flat bed without using bedrails?: Total Help needed moving from lying on your back to sitting on the side of a flat bed without using bedrails?: Total Help needed moving to and from a bed to a chair (including a wheelchair)?: Total Help needed standing up from a chair using your arms (e.g., wheelchair or bedside chair)?: Total Help needed to walk in hospital room?: Total Help needed climbing 3-5 steps with a railing? : Total 6 Click Score: 6     End of Session   Activity Tolerance: Patient limited by fatigue;Patient limited by pain Patient left: in bed;with call bell/phone within reach;with bed alarm set Nurse Communication: Mobility status PT Visit Diagnosis: Other abnormalities of gait and mobility (R26.89);Pain;Muscle weakness (generalized) (M62.81) Pain - Right/Left: Right Pain - part of body: Leg    Time: 0912-0930 PT Time Calculation (min) (ACUTE ONLY): 18 min   Charges:   PT Evaluation $PT Eval Moderate Complexity: 1 Mod            11:48 AM, 05/23/23 Zamariah Seaborn Small Teiara Baria MPT [facility] [phone] [phone]

## 2023-05-23 NOTE — Evaluation (Addendum)
 Occupational Therapy Evaluation Patient Details Name: [name]. MRN: [id number] DOB: 1970/11/13 Today's Date: 05/23/2023   History of Present Illness   [name]. is a 53 y.o. male with medical history significant for ESRD on dialysis T Th Sat, right above-knee amputation, hypertension, seizure, paroxysmal A-fib, blind in left eye, diastolic CHF.  Patient presented to the ED with complaints of generalized weakness over the past few days.  He went for dialysis yesterday, he was not dialyzed because were unable to transfer him onto the dialysis chair, patient reports he left his 'lift" at home.  2 episodes of vomiting 2 days ago but no blood. Chronic diarrhea that is unchanged.  No black stools no bloody stools. . No chest pain, no cough, no chest pain or difficulty breathing.     Hospitalized 3/13- 3/25 for all effusion, he underwent thoracentesis by IR with drainage of 1.5 L of fluid thought to be parapneumonic effusion trauma related.  Subsequent CT showed very large persistent right effusion.  Patient had pigtail chest tube placed by pulmonology with improved drainage, and then had light 6 infused per chest tube with resolution of oral effusion.  Cytology did not show malignant cells.  He also had right AKA stump wound dehiscence and underwent right AKA revision 8/19.  Subsequently large amount of blood collected in the stump that was when sutures were released- 3/22.  Received total of 3 units PRBC during specialization.     Clinical Impressions Pt agreeable to OT evaluation. Pt reported significant difficulty with ADL's at home and wife unable to assist because she works. Pt required mod A for supine to sit and CGA for sit to supine. Max A to scoot to head of bed while seated. Limited B UE A/ROM and strength at the shoulder. Able to sit upright and keep balance without physical assist. No standing attempted due to L LE wound and reports that pt has not stood in some time. Pt left  in the bed with call bell within reach and bed alarm set. Pt will benefit from continued OT in the hospital and recommended venue below to increase strength, balance, and endurance for safe ADL's.        If plan is discharge home, recommend the following:   Two people to help with walking and/or transfers;A lot of help with bathing/dressing/bathroom;Assistance with cooking/housework;Assist for transportation;Help with stairs or ramp for entrance     Functional Status Assessment   Patient has had a recent decline in their functional status and demonstrates the ability to make significant improvements in function in a reasonable and predictable amount of time.     Equipment Recommendations   None recommended by OT             Precautions/Restrictions   Precautions Precautions: Fall Recall of Precautions/Restrictions: Intact Precaution/Restrictions Comments: R AKA; sutures remain Restrictions Weight Bearing Restrictions Per Provider Order: No Other Position/Activity Restrictions: was NWB 3/25 for L LE     Mobility Bed Mobility Overal bed mobility: Needs Assistance Bed Mobility: Supine to Sit, Sit to Supine     Supine to sit: Mod assist Sit to supine: Contact guard assist, HOB elevated   General bed mobility comments: slow labored movement    Transfers                   General transfer comment: did not attempt transfer      Balance Overall balance assessment: Needs assistance Sitting-balance support: No upper extremity supported, Feet supported (  one foot supported) Sitting balance-Leahy Scale: Fair Sitting balance - Comments: seated at EOB; could maintain upright position without assist                                   ADL either performed or assessed with clinical judgement   ADL Overall ADL's : Needs assistance/impaired Eating/Feeding: Set up;Bed level   Grooming: Moderate assistance;Bed level   Upper Body Bathing:  Moderate assistance;Sitting   Lower Body Bathing: Maximal assistance;Sitting/lateral leans   Upper Body Dressing : Moderate assistance;Sitting   Lower Body Dressing: Maximal assistance;Sitting/lateral leans     Toilet Transfer Details (indicate cue type and reason): not attempted today; pt has not stood for some time; recent NWB in L LE Toileting- Clothing Manipulation and Hygiene: Maximal assistance;Bed level;Total assistance               Vision Baseline Vision/History: 2 Legally blind Ability to See in Adequate Light: 2 Moderately impaired Patient Visual Report: No change from baseline Additional Comments: baseline deficits     Perception Perception: Not tested       Praxis Praxis: Not tested       Pertinent Vitals/Pain Pain Assessment Pain Assessment: 0-10 Pain Score: 8  Pain Location: R AKA revision Pain Descriptors / Indicators: Other (Comment) ("hurts") Pain Intervention(s): Limited activity within patient's tolerance, Monitored during session, Repositioned     Extremity/Trunk Assessment Upper Extremity Assessment Upper Extremity Assessment: LUE deficits/detail;RUE deficits/detail RUE Deficits / Details: 4th digit amputation; 3-/5 shoulder flexion; generally weak otherwise LUE Deficits / Details: 3rd digit DIP amputation; pt reports rotator cuff issue at baseline; 2+/5 shoulder flexion; generally weak   Lower Extremity Assessment Lower Extremity Assessment: Defer to PT evaluation   Cervical / Trunk Assessment Cervical / Trunk Assessment: Kyphotic   Communication Communication Communication: No apparent difficulties   Cognition Arousal: Alert Behavior During Therapy: WFL for tasks assessed/performed, Agitated Cognition: No apparent impairments                               Following commands: Intact Following commands impaired: Only follows one step commands consistently     Cueing  General Comments   Cueing Techniques: Verbal  cues                 Home Living Family/patient expects to be discharged to:: Skilled nursing facility Living Arrangements: Spouse/significant other Available Help at Discharge: Family;Available PRN/intermittently Type of Home: House Home Access: Ramped entrance;Stairs to enter Entrance Stairs-Number of Steps: 3 Entrance Stairs-Rails: Right;Left;Can reach both Home Layout: One level     Bathroom Shower/Tub: Walk-in shower;Sponge bathes at baseline   [employer]: Standard Bathroom Accessibility: No   Home Equipment: Rollator (4 wheels);Rolling Walker (2 wheels);BSC/3in1   Additional Comments: reports spouse in unable to care for hime at home      Prior Functioning/Environment Prior Level of Function : Needs assist       Physical Assist : Mobility (physical);ADLs (physical)   ADLs (physical): Bathing;Dressing;Toileting;IADLs Mobility Comments: 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 Has been at SNF, progressing to standing with therapy but has not completed full stand yet. Staff use hoyer lift for transfers and assist for sitting balance. "I can't walk" (Pt reports he has not stood in some time.) ADLs Comments: Pt reports ADL's have not been getting done while at home.  Wife cannot help and he cannot do them on his own.    OT Problem List: Decreased strength;Decreased range of motion;Decreased activity tolerance;Impaired balance (sitting and/or standing)   OT Treatment/Interventions: Self-care/ADL training;Therapeutic exercise;Therapeutic activities;Patient/family education;Balance training      OT Goals(Current goals can be found in the care plan section)   Acute Rehab OT Goals Patient Stated Goal: improve function OT Goal Formulation: With patient Time For Goal Achievement: 06/06/23 Potential to Achieve Goals: Fair   OT Frequency:  Min 2X/week                  AM-PAC OT "6 Clicks" Daily Activity     Outcome Measure  Help from another person eating meals?: A Little Help from another person taking care of personal grooming?: A Little Help from another person toileting, which includes using toliet, bedpan, or urinal?: A Lot Help from another person bathing (including washing, rinsing, drying)?: A Lot Help from another person to put on and taking off regular upper body clothing?: A Lot Help from another person to put on and taking off regular lower body clothing?: A Lot 6 Click Score: 14   End of Session    Activity Tolerance: Patient tolerated treatment well Patient left: in bed;with call bell/phone within reach;with bed alarm set  OT Visit Diagnosis: Muscle weakness (generalized) (M62.81)                Time: 1510-1520 OT Time Calculation (min): 10 min Charges:  OT General Charges $OT Visit: 1 Visit OT Evaluation $OT Eval Low Complexity: 1 Low  Jerriann Schrom OT, MOT  Danie Chandler 05/23/2023, 5:01 PM

## 2023-05-23 NOTE — Plan of Care (Signed)
   Problem: Coping: Goal: Level of anxiety will decrease Outcome: Progressing

## 2023-05-23 NOTE — NC FL2 (Signed)
 [place] MEDICAID FL2 LEVEL OF CARE FORM     IDENTIFICATION  Patient Name: [name]. Birthdate: 05-30-1970 Sex: male Admission Date (Current Location): 05/22/2023  [place] and [place] Number:  [employer] and Address:  [facility],  [address], [name] [address]      Provider Number: [id number]  Attending Physician Name and Address:  Tyrone Nine, MD  Relative Name and Phone Number:       Current Level of Care: Hospital Recommended Level of Care: Skilled Nursing Facility Prior Approval Number:    Date Approved/Denied:   PASRR Number: [id number]  Discharge Plan: SNF    Current Diagnoses: Patient Active Problem List   Diagnosis Date Noted   Generalized weakness 05/22/2023   Hx of AKA (above knee amputation), right (HCC) 05/11/2023   Pleural effusion 05/05/2023   Seizures (HCC) 05/05/2023   Right BKA infection (HCC) 04/04/2023   Pressure injury of skin 04/04/2023   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 02/26/2022   H/O enucleation of left eyeball 02/26/2022   PAD (peripheral artery disease) (HCC) 02/26/2022   History of seizure 02/26/2022   Seasonal allergic rhinitis 07/08/2021   Myoclonus 06/23/2021   Steal syndrome as complication of dialysis access (HCC) 02/06/2021   Coronary artery disease 02/05/2021   Dehiscence of amputation stump of right lower extremity (HCC) 01/29/2021   ESRD on hemodialysis (HCC)    Anemia due to chronic kidney disease, on chronic dialysis (HCC)     Paroxysmal atrial fibrillation (HCC) - not on systemic anticoagulation due to ICH while on Eliquis in 11-2021. 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) 08/19/2020   Anxiety state 08/19/2020   Callosity 08/19/2020   ED (erectile dysfunction) of organic origin 08/19/2020   Long term (current) use of insulin (HCC) 08/19/2020   Myalgia 08/19/2020   CAD/ H/o prior STEMI 02/2020 s/p DES to LAD 08/19/2020   Blindness of left eye 08/15/2020   Combined forms of age-related cataract of right eye 04/17/2020   Moderate protein-calorie malnutrition (HCC) 05/14/2019   Disorder of phosphorus metabolism, unspecified 05/07/2019   Iron deficiency anemia, unspecified 05/07/2019   Secondary hyperparathyroidism of renal origin (HCC) 11/28/2018   Chronic Grade 2 diastolic heart failure/EF 55 to 60% 12/13/2017   Diabetes mellitus, type II (HCC) 11/23/2017   Diabetic retinopathy (HCC) 07/05/2017   Uncontrolled type 2 diabetes mellitus with hyperglycemia, with long-term current use of insulin (HCC) 04/20/2016   Mixed hyperlipidemia    Essential hypertension, benign     Orientation RESPIRATION BLADDER Height & Weight     Self, Time, Situation, Place  Normal Continent Weight: 159 lb 2.8 oz (72.2 kg) Height:  6\' 3"  (190.5 cm)  BEHAVIORAL SYMPTOMS/MOOD NEUROLOGICAL BOWEL NUTRITION STATUS    Convulsions/Seizures Incontinent Diet (See d/c summary)  AMBULATORY STATUS COMMUNICATION OF NEEDS Skin   Total Care Verbally Other (Comment) (Unstageable wound to left heel. Stage II to sacrum.)  Personal Care Assistance Level of Assistance  Dressing, Feeding, Bathing Bathing Assistance: Maximum assistance Feeding assistance: Limited assistance Dressing Assistance: Maximum assistance     Functional Limitations Info  Sight, Hearing, Speech Sight Info: Impaired Hearing Info: Adequate Speech Info: Adequate    SPECIAL CARE FACTORS FREQUENCY  PT (By licensed PT)     PT  Frequency: 5x weekly              Contractures      Additional Factors Info  Code Status, Allergies Code Status Info: Full code Allergies Info: Promethazine           Current Medications (05/23/2023):  This is the current hospital active medication list Current Facility-Administered Medications  Medication Dose Route Frequency Provider Last Rate Last Admin   (feeding supplement) PROSource Plus liquid 30 mL  30 mL Oral TID BM Emokpae, Ejiroghene E, MD   30 mL at 05/23/23 0830   acetaminophen (TYLENOL) tablet 650 mg  650 mg Oral Q6H PRN Emokpae, Ejiroghene E, MD   650 mg at 05/22/23 2048   Or   acetaminophen (TYLENOL) suppository 650 mg  650 mg Rectal Q6H PRN Emokpae, Ejiroghene E, MD       amiodarone (PACERONE) tablet 200 mg  200 mg Oral BH-q7a Emokpae, Ejiroghene E, MD   200 mg at 05/23/23 1610   aspirin EC tablet 81 mg  81 mg Oral Daily Emokpae, Ejiroghene E, MD   81 mg at 05/23/23 0831   atorvastatin (LIPITOR) tablet 80 mg  80 mg Oral QHS Emokpae, Ejiroghene E, MD   80 mg at 05/22/23 2046   [START ON 05/24/2023] calcitRIOL (ROCALTROL) capsule 2.25 mcg  2.25 mcg Oral Q T,Th,Sat-1800 Emokpae, Ejiroghene E, MD       calcium acetate (PHOSLO) capsule 1,334 mg  1,334 mg Oral TID WC Emokpae, Ejiroghene E, MD   1,334 mg at 05/23/23 1200   carvedilol (COREG) tablet 12.5 mg  12.5 mg Oral BID Emokpae, Ejiroghene E, MD   12.5 mg at 05/23/23 0831   Chlorhexidine Gluconate Cloth 2 % PADS 6 each  6 each Topical Q0600 Terrial Rhodes, MD   6 each at 05/23/23 1101   [START ON 05/24/2023] cinacalcet (SENSIPAR) tablet 60 mg  60 mg Oral Q T,Th,Sat-1800 Emokpae, Ejiroghene E, MD       heparin injection 5,000 Units  5,000 Units Subcutaneous Q8H Emokpae, Ejiroghene E, MD   5,000 Units at 05/23/23 9604   levETIRAcetam (KEPPRA) tablet 500 mg  500 mg Oral Once per day on Sunday Tuesday Thursday Saturday Mariea Clonts, Ejiroghene E, MD   500 mg at 05/22/23 2046   levETIRAcetam (KEPPRA) tablet 750 mg  750 mg Oral  Once per day on Monday Wednesday Friday Emokpae, Ejiroghene E, MD   750 mg at 05/23/23 0830   loperamide (IMODIUM) capsule 2 mg  2 mg Oral PRN Emokpae, Ejiroghene E, MD       multivitamin (RENA-VIT) tablet 1 tablet  1 tablet Oral QHS Emokpae, Ejiroghene E, MD   1 tablet at 05/22/23 2045   ondansetron (ZOFRAN) tablet 4 mg  4 mg Oral Q6H PRN Emokpae, Ejiroghene E, MD       Or   ondansetron (ZOFRAN) injection 4 mg  4 mg Intravenous Q6H PRN Emokpae, Ejiroghene E, MD       oxyCODONE (Oxy IR/ROXICODONE) immediate release tablet 5 mg  5 mg Oral Q4H PRN Emokpae, Ejiroghene E, MD       polyethylene glycol (MIRALAX / GLYCOLAX) packet 17 g  17 g Oral Daily PRN Emokpae, Ejiroghene E, MD         Discharge Medications: Please see discharge summary for a list of discharge medications.  Relevant Imaging Results:  Relevant Lab Results:   Additional Information SSN [id number]. [facility] [place] T[facility].  Karn Cassis, LCSW

## 2023-05-23 NOTE — Care Management Obs Status (Signed)
 MEDICARE OBSERVATION STATUS NOTIFICATION   Patient Details  Name: [name]. MRN: [id number] Date of Birth: 12/31/1970   Medicare Observation Status Notification Given:  Yes    [name] 05/23/2023, 3:44 PM

## 2023-05-23 NOTE — Progress Notes (Signed)
 Nurse at bedside,patient alert to person,and situation,confused to time,and place.Patient c/o pain to right stump, oxycodone 5 mg's given by mouth for pain per [facility] prn Plan of care on going.

## 2023-05-24 DIAGNOSIS — R531 Weakness: Secondary | ICD-10-CM | POA: Diagnosis not present

## 2023-05-24 LAB — CBC
HCT: 24.2 % — ABNORMAL LOW (ref 39.0–52.0)
Hemoglobin: 7 g/dL — ABNORMAL LOW (ref 13.0–17.0)
MCH: 22.7 pg — ABNORMAL LOW (ref 26.0–34.0)
MCHC: 28.9 g/dL — ABNORMAL LOW (ref 30.0–36.0)
MCV: 78.3 fL — ABNORMAL LOW (ref 80.0–100.0)
Platelets: 272 10*3/uL (ref 150–400)
RBC: 3.09 MIL/uL — ABNORMAL LOW (ref 4.22–5.81)
RDW: 24.4 % — ABNORMAL HIGH (ref 11.5–15.5)
WBC: 10.9 10*3/uL — ABNORMAL HIGH (ref 4.0–10.5)
nRBC: 0 % (ref 0.0–0.2)

## 2023-05-24 LAB — BASIC METABOLIC PANEL WITH GFR
Anion gap: 14 (ref 5–15)
BUN: 123 mg/dL — ABNORMAL HIGH (ref 6–20)
CO2: 21 mmol/L — ABNORMAL LOW (ref 22–32)
Calcium: 8.8 mg/dL — ABNORMAL LOW (ref 8.9–10.3)
Chloride: 102 mmol/L (ref 98–111)
Creatinine, Ser: 14.41 mg/dL — ABNORMAL HIGH (ref 0.61–1.24)
GFR, Estimated: 4 mL/min — ABNORMAL LOW (ref >60)
Glucose, Bld: 141 mg/dL — ABNORMAL HIGH (ref 70–99)
Potassium: 5.3 mmol/L — ABNORMAL HIGH (ref 3.5–5.1)
Sodium: 137 mmol/L (ref 135–145)

## 2023-05-24 LAB — PREPARE RBC (CROSSMATCH)

## 2023-05-24 LAB — HEPATITIS B SURFACE ANTIGEN: Hepatitis B Surface Ag: NONREACTIVE

## 2023-05-24 MED ORDER — DARBEPOETIN ALFA 150 MCG/0.3ML IJ SOSY
150.0000 ug | PREFILLED_SYRINGE | INTRAMUSCULAR | Status: DC
  Administered 2023-05-26: 150 ug via SUBCUTANEOUS
  Filled 2023-05-24 (×2): qty 0.3

## 2023-05-24 MED ORDER — SODIUM ZIRCONIUM CYCLOSILICATE 5 G PO PACK
5.0000 g | PACK | Freq: Once | ORAL | Status: AC
  Administered 2023-05-24: 5 g via ORAL
  Filled 2023-05-24: qty 1

## 2023-05-24 MED ORDER — SODIUM CHLORIDE 0.9% IV SOLUTION: Freq: Once | INTRAVENOUS | Status: AC

## 2023-05-24 NOTE — Progress Notes (Signed)
 [facility] PROGRESS NOTE  [name]. (DOB: 17-Feb-1971) MVH:[id number] PCP: [name], FNP Outpatient Specialists: Orthopedics, [name]  Brief Narrative: [name]. is a 53 y.o. male with a history of ESRD (HD on TTS thru LUA AVF), PAD s/p multiple amputations including right AKA s/p recent revision 3/19, history of CVA, CAD s/p PCI, T2DM, PAF, HLD, chronic HFpEF, seizure disorder, and recent admission requiring chest tube for parapneumonia pleural effusion who presented to the ED on 05/22/2023 with weakness. Of note, he had been admitted at [facility] 3/13 - 3/25 and despite having SNF recommended and a peer to peer completed, his insurance did not authorize SNF placement, so he was sent home with maximal home health services. Here he was found to have hgb 7g/dl without active bleeding, BUN 110, he had missed HD 3/29 due to weakness. CXR showed improving RLL opacity.   Subjective: No further bleeding, no new issues. Pain in right stump site is controlled.   Objective: BP (!) 183/61   Pulse 61   Temp 98 F (36.7 C) (Oral)   Resp 16   Ht 6\' 3"  (1.905 m)   Wt 75.3 kg   SpO2 98%   BMI 20.75 kg/m   Gen: Chronically ill-appearing male in no acute distress HEENT: L eye absent Pulm: Clear, nonlabored  CV: RRR, no MRG or edema GI: Soft, NT, ND, +BS  Neuro: Alert and oriented. No new focal deficits. Ext: Warm, dry, LUA AVF +T/B. R AKA incision has well-apposed wound edges with sutures in placed, dried blood without significant tenderness, erythema or discharge. Left heel wound has black eschar and no exudate/odor or tenderness.  Skin: As above, no new rashes, lesions or ulcers on visualized skin   Assessment & Plan: Generalized weakness: Likely from deconditioning from recent prolonged hospitalization. No focal deficits.  - Needs PT/OT at rehab/SNF. Has bed offer, insurance authorization sent, pending.   Acute on chronic anemia of ESRD and AOCD and ABLA s/p AKA  revision: Received 3u RBCs during last hospitalization, DC hgb 7.3, was 7.0 on presentation to ED. No ongoing bleeding. Last colonoscopy 01/2023 poor prep, congested mucosa, internal and external hemorrhoids - Give 1u RBCs given persistent weakness, level 7g/dl.  - PPI, monitor CBC   ESRD:  - Missed HD 3/29 due to weakness, nephrology consulted to resume dialysis. HD today.   Hyperkalemia:  - Lokelma - HD - Monitor in AM   CAD/ H/o prior STEMI 02/2020 s/p DES to LAD: Troponin elevated to 252 >> 261.  In the setting of missed HD and chronic anemia.  Patient denies chest pain, no dyspnea.  EKG without ischemic changes. This is consistent with demand myocardial ischemia.  - Resume aspirin, atorvastatin   Hx right AKA: s/p revision by Dr. Alvie Heidelberg. 3/22- Stump swelling was noted, sutures were released with large amount of blood noted.  Blood loss likely contributing to patient's chronic anemia. Wound appears uninfected. - Routine wound care and orthopedics follow up.    Left heel wound: Has boot - Offload - Optimize nutritional status - WOC consulted  Chronic HFpEF: G2DD, LVEF 55-60% on last echo.  - Volume management per HD    History of seizure:  - Continue keppra    T2DM: Diet-controlled.  - Monitor glucose daily.     PAF: NSR on admission. Not on anticoagulation due to history of ICH. - Continue rhythm control with amiodarone  Elevated TSH: Very mild.  - Suggest recheck after convalescence.   Fulton Reek  [name] Newcomer, MD [facility] [email or web address] 05/24/2023, 2:29 PM

## 2023-05-24 NOTE — Plan of Care (Signed)

## 2023-05-24 NOTE — Progress Notes (Signed)
 Patient transported to dialysis

## 2023-05-24 NOTE — TOC Progression Note (Signed)
 Transition of Care (TOC) - Progression Note    Patient Details  Name: [name]. MRN: [id number] Date of Birth: [birth year]  Transition of Care [facility]) CM/SW Contact  Karn Cassis, [place] Phone Number: 05/24/2023, 1:50 PM  Clinical Narrative:  Bed offers presented to pt who chooses [facility]. Insurance authorization updated. TOC will follow.      Expected Discharge Plan: Skilled Nursing Facility Barriers to Discharge: Continued Medical Work up  Expected Discharge Plan and Services In-house Referral: Clinical Social Work   Post Acute Care Choice: Skilled Nursing Facility Living arrangements for the past 2 months: Single Family Home                                       Social Determinants of Health (SDOH) Interventions SDOH Screenings   Food Insecurity: No Food Insecurity (05/22/2023)  Housing: High Risk (05/22/2023)  Transportation Needs: No Transportation Needs (05/22/2023)  Utilities: Not At Risk (05/22/2023)  Financial Resource Strain: High Risk (07/05/2017)  Physical Activity: Inactive (07/05/2017)  Social Connections: Socially Integrated (05/05/2023)  Stress: Stress Concern Present (07/05/2017)  Tobacco Use: Low Risk  (05/22/2023)  Health Literacy: Low Risk  (06/01/2020)   Received from [facility]    Readmission Risk Interventions    05/17/2023    3:44 PM 07/27/2021   11:49 AM 11/27/2020    3:06 PM  Readmission Risk Prevention Plan  Transportation Screening Complete Complete Complete  PCP or Specialist Appt within 3-5 Days   Complete  HRI or Home Care Consult   Complete  Social Work Consult for Recovery Care Planning/Counseling   Complete  Palliative Care Screening   Not Applicable  Medication Review Oceanographer) Referral to Pharmacy Complete Complete  PCP or Specialist appointment within 3-5 days of discharge Complete    HRI or Home Care Consult Complete    SW Recovery Care/Counseling Consult Complete Complete    Palliative Care Screening Not Applicable Not Applicable   Skilled Nursing Facility Not Applicable Not Applicable

## 2023-05-24 NOTE — Progress Notes (Signed)
 Pt goal not met d/t cut off 1 hour, Dr. Arrie Aran notified.  05/24/23 1330  Vitals  Temp 98 F (36.7 C)  Temp Source Oral  BP (!) 170/80  BP Location Right Arm  BP Method Automatic  Patient Position (if appropriate) Lying  During Treatment Monitoring  Intra-Hemodialysis Comments See progress note  Post Treatment  Dialyzer Clearance Lightly streaked  Hemodialysis Intake (mL) 0 mL  Liters Processed 50  Fluid Removed (mL) 1700 mL  Tolerated HD Treatment No (Comment)  Post-Hemodialysis Comments see notes.  AVG/AVF Arterial Site Held (minutes) 10 minutes  AVG/AVF Venous Site Held (minutes) 10 minutes  Fistula / Graft Left Upper arm  No placement date or time found.   Orientation: Left  Access Location: Upper arm  Site Condition No complications  Fistula / Graft Assessment Present;Thrill;Bruit  Status Deaccessed  Needle Size 15  Drainage Description None

## 2023-05-24 NOTE — Plan of Care (Signed)

## 2023-05-24 NOTE — Progress Notes (Addendum)
 Patient ID: [name]., male   DOB: 05-Jul-1970, 53 y.o.   MRN: [id number] S: No new complaints O:BP (!) 146/64   Pulse (!) 58   Temp 98 F (36.7 C)   Resp 16   Ht 6\' 3"  (1.905 m)   Wt 72.2 kg   SpO2 96%   BMI 19.90 kg/m  No intake or output data in the 24 hours ending 05/24/23 1008 Intake/Output: I/O last 3 completed shifts: In: 480 [P.O.:480] Out: -   Intake/Output this shift:  No intake/output data recorded. Weight change:  Gen: NAD CVS: bradycardic at 58 Resp: CTA Abd: +BS, soft, NT/ND Ext: s/p R AKA, no edema, LUE AVF +T/B  Recent Labs  Lab 05/22/23 1239 05/23/23 0446 05/24/23 0448  NA 137 141 137  K 4.6 4.9 5.3*  CL 102 104 102  CO2 22 21* 21*  GLUCOSE 89 78 141*  BUN 110* 114* 123*  CREATININE 12.72* 13.48* 14.41*  ALBUMIN 2.2*  --   --   CALCIUM 8.5* 9.0 8.8*  AST 13*  --   --   ALT 8  --   --    Liver Function Tests: Recent Labs  Lab 05/22/23 1239  AST 13*  ALT 8  ALKPHOS 78  BILITOT 0.7  PROT 6.3*  ALBUMIN 2.2*   No results for input(s): "LIPASE", "AMYLASE" in the last 168 hours. No results for input(s): "AMMONIA" in the last 168 hours. CBC: Recent Labs  Lab 05/22/23 1239 05/23/23 0446 05/24/23 0448  WBC 11.8* 10.3 10.9*  NEUTROABS 8.1*  --   --   HGB 7.0* 7.5* 7.0*  HCT 23.9* 25.8* 24.2*  MCV 76.8* 76.6* 78.3*  PLT 286 281 272   Cardiac Enzymes: No results for input(s): "CKTOTAL", "CKMB", "CKMBINDEX", "TROPONINI" in the last 168 hours. CBG: Recent Labs  Lab 05/22/23 1834 05/22/23 2148 05/23/23 0746 05/23/23 0810  GLUCAP 72 106* 47* 249*    Iron Studies: No results for input(s): "IRON", "TIBC", "TRANSFERRIN", "FERRITIN" in the last 72 hours. Studies/Results: DG Femur Min 2 Views Right Result Date: 05/22/2023 CLINICAL DATA:  Stump infection EXAM: RIGHT FEMUR 2 VIEWS COMPARISON:  05/05/2023 FINDINGS: 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 Sharp resection margin without erosion or  periosteal elevation. No fracture or dislocation. Severe atherosclerotic calcifications. Soft tissue prominence of the distal stump. No soft tissue gas. 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 No radiographic evidence of osteomyelitis. Electronically Signed   By: Duanne Guess D.O.   On: 05/22/2023 14:47   DG Chest Port 1 View Result Date: 05/22/2023 CLINICAL DATA:  Weakness EXAM: PORTABLE CHEST 1 VIEW COMPARISON:  05/11/2023 FINDINGS: Stable cardiomegaly. Patchy right lower lobe opacity, improving. Small right pleural effusion, minimally improved. Left lung is clear. No pneumothorax. IMPRESSION: Patchy right lower lobe opacity, improving. Small right pleural effusion, minimally improved. Electronically Signed   By: Duanne Guess D.O.   On: 05/22/2023 12:49    (feeding supplement) PROSource Plus  30 mL Oral TID BM   amiodarone  200 mg Oral BH-q7a   aspirin EC  81 mg Oral Daily   atorvastatin  80 mg Oral QHS   calcitRIOL  2.25 mcg Oral Q T,Th,Sat-1800   calcium acetate  1,334 mg Oral TID WC   carvedilol  12.5 mg Oral BID   Chlorhexidine Gluconate Cloth  6 each Topical Q0600   cinacalcet  60 mg Oral Q T,Th,Sat-1800   heparin  5,000 Units  Subcutaneous Q8H   levETIRAcetam  500 mg Oral Once per day on Sunday Tuesday Thursday Saturday   levETIRAcetam  750 mg Oral Once per day on Monday Wednesday Friday   multivitamin  1 tablet Oral QHS    BMET    Component Value Date/Time   NA 137 05/24/2023 0448   K 5.3 (H) 05/24/2023 0448   CL 102 05/24/2023 0448   CO2 21 (L) 05/24/2023 0448   GLUCOSE 141 (H) 05/24/2023 0448   BUN 123 (H) 05/24/2023 0448   CREATININE 14.41 (H) 05/24/2023 0448   CALCIUM 8.8 (L) 05/24/2023 0448   GFRNONAA 4 (L) 05/24/2023 0448   GFRAA 5 (L) 08/27/2019 1540   CBC    Component Value Date/Time   WBC 10.9 (H) 05/24/2023 0448   RBC 3.09 (L) 05/24/2023 0448   HGB 7.0 (L) 05/24/2023 0448   HCT 24.2 (L) 05/24/2023 0448    PLT 272 05/24/2023 0448   MCV 78.3 (L) 05/24/2023 0448   MCH 22.7 (L) 05/24/2023 0448   MCHC 28.9 (L) 05/24/2023 0448   RDW 24.4 (H) 05/24/2023 0448   LYMPHSABS 1.2 05/22/2023 1239   MONOABS 1.1 (H) 05/22/2023 1239   EOSABS 1.3 (H) 05/22/2023 1239   BASOSABS 0.1 05/22/2023 1239    Dialysis Orders: Center: [facility]. 3 hours 15 minutes. EDW: 77.5 kg (however likely well under this as per outpatient unit). LUE AVF, 15-gauge needles. Flow rates: 400/800. 37 degrees. 2K/2.5 calcium. UF profile #2. Heparin: Loading 1000 units, 400 units/h. Meds: Mircera 100 mcg every 2 weeks (last dose 3/4)    Assessment/Plan:  Generalized weakness - ongoing issue.  Was not able to find SNF placement.  Plan per primary svc  ESRD -  will plan for HD today to keep on outpatient schedule.    Hypertension/volume  - stable  Anemia  - continue with ESA and transfuse for Hgb <7.  He is receiving a blood transfusion this morning.   Metabolic bone disease -   continue with home meds  Nutrition -  renal diet, carb modified   DM type 2 - per primary svc  CAD - stable S/p revision of right AKA - per primary HFpEF- appears euvolemic Seizure disorder - continue with Keppra.  Irena Cords, MD [employer] [phone]

## 2023-05-25 DIAGNOSIS — R531 Weakness: Secondary | ICD-10-CM | POA: Diagnosis not present

## 2023-05-25 LAB — CBC
HCT: 27 % — ABNORMAL LOW (ref 39.0–52.0)
Hemoglobin: 8.1 g/dL — ABNORMAL LOW (ref 13.0–17.0)
MCH: 23 pg — ABNORMAL LOW (ref 26.0–34.0)
MCHC: 30 g/dL (ref 30.0–36.0)
MCV: 76.7 fL — ABNORMAL LOW (ref 80.0–100.0)
Platelets: 248 10*3/uL (ref 150–400)
RBC: 3.52 MIL/uL — ABNORMAL LOW (ref 4.22–5.81)
RDW: 23.9 % — ABNORMAL HIGH (ref 11.5–15.5)
WBC: 11.8 10*3/uL — ABNORMAL HIGH (ref 4.0–10.5)
nRBC: 0 % (ref 0.0–0.2)

## 2023-05-25 LAB — BPAM RBC
Blood Product Expiration Date: 202504072359
ISSUE DATE / TIME: 202504010910
Unit Type and Rh: 6200

## 2023-05-25 LAB — BASIC METABOLIC PANEL WITH GFR
Anion gap: 16 — ABNORMAL HIGH (ref 5–15)
BUN: 97 mg/dL — ABNORMAL HIGH (ref 6–20)
CO2: 23 mmol/L (ref 22–32)
Calcium: 8.5 mg/dL — ABNORMAL LOW (ref 8.9–10.3)
Chloride: 98 mmol/L (ref 98–111)
Creatinine, Ser: 11.23 mg/dL — ABNORMAL HIGH (ref 0.61–1.24)
GFR, Estimated: 5 mL/min — ABNORMAL LOW (ref >60)
Glucose, Bld: 101 mg/dL — ABNORMAL HIGH (ref 70–99)
Potassium: 5.1 mmol/L (ref 3.5–5.1)
Sodium: 137 mmol/L (ref 135–145)

## 2023-05-25 LAB — TYPE AND SCREEN
ABO/RH(D): A POS
Antibody Screen: NEGATIVE
Unit division: 0

## 2023-05-25 LAB — HEPATITIS B SURFACE ANTIBODY, QUANTITATIVE: Hep B S AB Quant (Post): 25 m[IU]/mL

## 2023-05-25 MED ORDER — HYDROXYZINE HCL 25 MG PO TABS
25.0000 mg | ORAL_TABLET | Freq: Once | ORAL | Status: AC | PRN
  Administered 2023-05-25: 25 mg via ORAL
  Filled 2023-05-25: qty 1

## 2023-05-25 MED ORDER — SODIUM ZIRCONIUM CYCLOSILICATE 10 G PO PACK
10.0000 g | PACK | Freq: Every day | ORAL | Status: DC
  Administered 2023-05-25 – 2023-05-26 (×2): 10 g via ORAL
  Filled 2023-05-25 (×2): qty 1

## 2023-05-25 NOTE — Progress Notes (Signed)
 Patient ID: [name] Barns., male   DOB: 1970-05-25, 53 y.o.   MRN: [id number] S: No complaints this am, however he continues to sign off of dialysis early despite multiple conversations about the risks O:BP (!) 172/72 (BP Location: Right Arm)   Pulse 61   Temp 97.8 F (36.6 C) (Oral)   Resp 20   Ht 6\' 3"  (1.905 m)   Wt 75.3 kg   SpO2 100%   BMI 20.75 kg/m   Intake/Output Summary (Last 24 hours) at 05/25/2023 0923 Last data filed at 05/25/2023 0500 Gross per 24 hour  Intake 808.33 ml  Output 1700 ml  Net -891.67 ml   Intake/Output: I/O last 3 completed shifts: In: 1048.3 [P.O.:720; I.V.:42.3; Blood:286] Out: 1700 [Other:1700]  Intake/Output this shift:  No intake/output data recorded. Weight change:  Gen:NAD CVS: RRr Resp:CTA Abd: +BS, soft, NT/ND Ext: s/p RAKA, left heel decubitus ulcer wrapped  Recent Labs  Lab 05/22/23 1239 05/23/23 0446 05/24/23 0448  NA 137 141 137  K 4.6 4.9 5.3*  CL 102 104 102  CO2 22 21* 21*  GLUCOSE 89 78 141*  BUN 110* 114* 123*  CREATININE 12.72* 13.48* 14.41*  ALBUMIN 2.2*  --   --   CALCIUM 8.5* 9.0 8.8*  AST 13*  --   --   ALT 8  --   --    Liver Function Tests: Recent Labs  Lab 05/22/23 1239  AST 13*  ALT 8  ALKPHOS 78  BILITOT 0.7  PROT 6.3*  ALBUMIN 2.2*   No results for input(s): "LIPASE", "AMYLASE" in the last 168 hours. No results for input(s): "AMMONIA" in the last 168 hours. CBC: Recent Labs  Lab 05/22/23 1239 05/23/23 0446 05/24/23 0448  WBC 11.8* 10.3 10.9*  NEUTROABS 8.1*  --   --   HGB 7.0* 7.5* 7.0*  HCT 23.9* 25.8* 24.2*  MCV 76.8* 76.6* 78.3*  PLT 286 281 272   Cardiac Enzymes: No results for input(s): "CKTOTAL", "CKMB", "CKMBINDEX", "TROPONINI" in the last 168 hours. CBG: Recent Labs  Lab 05/22/23 1834 05/22/23 2148 05/23/23 0746 05/23/23 0810  GLUCAP 72 106* 47* 249*    Iron Studies: No results for input(s): "IRON", "TIBC", "TRANSFERRIN", "FERRITIN" in the last 72  hours. Studies/Results: No results found.  (feeding supplement) PROSource Plus  30 mL Oral TID BM   amiodarone  200 mg Oral BH-q7a   aspirin EC  81 mg Oral Daily   atorvastatin  80 mg Oral QHS   calcitRIOL  2.25 mcg Oral Q T,Th,Sat-1800   calcium acetate  1,334 mg Oral TID WC   carvedilol  12.5 mg Oral BID   Chlorhexidine Gluconate Cloth  6 each Topical Q0600   cinacalcet  60 mg Oral Q T,Th,Sat-1800   [START ON 05/26/2023] darbepoetin (ARANESP) injection - DIALYSIS  150 mcg Subcutaneous Q Thu-1800   heparin  5,000 Units Subcutaneous Q8H   levETIRAcetam  500 mg Oral Once per day on Sunday Tuesday Thursday Saturday   levETIRAcetam  750 mg Oral Once per day on Monday Wednesday Friday   multivitamin  1 tablet Oral QHS   sodium zirconium cyclosilicate  10 g Oral Daily    BMET    Component Value Date/Time   NA 137 05/24/2023 0448   K 5.3 (H) 05/24/2023 0448   CL 102 05/24/2023 0448   CO2 21 (L) 05/24/2023 0448   GLUCOSE 141 (H) 05/24/2023 0448   BUN 123 (H) 05/24/2023 0448   CREATININE 14.41 (H) 05/24/2023 0448  CALCIUM 8.8 (L) 05/24/2023 0448   GFRNONAA 4 (L) 05/24/2023 0448   GFRAA 5 (L) 08/27/2019 1540   CBC    Component Value Date/Time   WBC 10.9 (H) 05/24/2023 0448   RBC 3.09 (L) 05/24/2023 0448   HGB 7.0 (L) 05/24/2023 0448   HCT 24.2 (L) 05/24/2023 0448   PLT 272 05/24/2023 0448   MCV 78.3 (L) 05/24/2023 0448   MCH 22.7 (L) 05/24/2023 0448   MCHC 28.9 (L) 05/24/2023 0448   RDW 24.4 (H) 05/24/2023 0448   LYMPHSABS 1.2 05/22/2023 1239   MONOABS 1.1 (H) 05/22/2023 1239   EOSABS 1.3 (H) 05/22/2023 1239   BASOSABS 0.1 05/22/2023 1239    Dialysis Orders: Center: [facility]. 3 hours 15 minutes. EDW: 77.5 kg (however likely well under this as per outpatient unit). LUE AVF, 15-gauge needles. Flow rates: 400/800. 37 degrees. 2K/2.5 calcium. UF profile #2. Heparin: Loading 1000 units, 400 units/h. Meds: Mircera 100 mcg every 2 weeks (last dose 3/4)     Assessment/Plan:  Generalized weakness - ongoing issue.  Was not able to find SNF placement.  Plan per primary svc  ESRD -  will plan for HD tomorrow to keep on outpatient schedule.  Again discussed the need to complete his entire HD session  Hyperkalemia - due to noncompliance with HD.  Will continue with Lokelma 10 grams daily    Hypertension/volume  - stable  ABLA on chronic Anemia  - continue with ESA and transfuse for Hgb <7.  He is receiving a blood transfusion this morning.   Metabolic bone disease -   continue with home meds  Nutrition -  renal diet, carb modified   DM type 2 - per primary svc  CAD - stable S/p revision of right AKA - per primary HFpEF- appears euvolemic Seizure disorder - continue with Keppra.  Irena Cords, MD [facility]

## 2023-05-25 NOTE — Plan of Care (Signed)

## 2023-05-25 NOTE — Progress Notes (Signed)
 Physical Therapy Treatment Patient Details Name: [name]. MRN: [id number] DOB: [birth year] Today's Date: 05/25/2023   History of Present Illness [name]. is a 53 y.o. male with medical history significant for ESRD on dialysis T Th Sat, right above-knee amputation, hypertension, seizure, paroxysmal A-fib, blind in left eye, diastolic CHF.  Patient presented to the ED with complaints of generalized weakness over the past few days.  He went for dialysis yesterday, he was not dialyzed because were unable to transfer him onto the dialysis chair, patient reports he left his 'lift" at home.  2 episodes of vomiting 2 days ago but no blood. Chronic diarrhea that is unchanged.  No black stools no bloody stools. . No chest pain, no cough, no chest pain or difficulty breathing.     Hospitalized 3/13- 3/25 for all effusion, he underwent thoracentesis by IR with drainage of 1.5 L of fluid thought to be parapneumonic effusion trauma related.  Subsequent CT showed very large persistent right effusion.  Patient had pigtail chest tube placed by pulmonology with improved drainage, and then had light 6 infused per chest tube with resolution of oral effusion.  Cytology did not show malignant cells.  He also had right AKA stump wound dehiscence and underwent right AKA revision 8/19.  Subsequently large amount of blood collected in the stump that was when sutures were released- 3/22.  Received total of 3 units PRBC during specialization.    PT Comments  Pt tolerated today's treatment session, quite well. No significant increase in pain during session, which is not major limiting factor in pt's mobility. Today's session addressed progressing bed mobility and sitting EOB for LLE strengthening. Pt noted with continued UB and LB weakness although able to assist with bed mobility more this session compared to evaluation. Pt with moderate assist to date and no increased pain at end of session. UB weakness and LLE  weakness are limiting factors currently in functional independence, in setting of recent R AKA. Pt would continue to benefit from skilled acute physical therapy services in order to progress toward POC goals, safety/independence with functional mobility and QOL.     If plan is discharge home, recommend the following: A lot of help with walking and/or transfers;A lot of help with bathing/dressing/bathroom;Assistance with cooking/housework;Assist for transportation   Can travel by private vehicle     No  Equipment Recommendations       Recommendations for Other Services       Precautions / Restrictions Precautions Precautions: Fall Recall of Precautions/Restrictions: Intact Precaution/Restrictions Comments: R AKA; sutures remain Restrictions Weight Bearing Restrictions Per Provider Order: No Other Position/Activity Restrictions: was NWB 3/25 for L LE     Mobility  Bed Mobility Overal bed mobility: Needs Assistance Bed Mobility: Supine to Sit, Sit to Supine Rolling: Mod assist Sidelying to sit: Mod assist Supine to sit: Mod assist Sit to supine: Mod assist Sit to sidelying: Mod assist General bed mobility comments: slow, labord movement, mod assist with consistent verbal cues and hand over hand tactile cues for proper movement patterns. Patient Response: Cooperative  Transfers                   General transfer comment: did not attempt transfer    Ambulation/Gait               General Gait Details: currently not ambulatory   Stairs             Wheelchair Mobility     Tilt  Bed Tilt Bed Patient Response: Cooperative  Modified Rankin (Stroke Patients Only)       Balance Overall balance assessment: Needs assistance Sitting-balance support: No upper extremity supported, Feet supported Sitting balance-Leahy Scale: Fair Sitting balance - Comments: seated at EOB; could maintain upright position without assist Postural control: Left lateral  lean     Standing balance comment: unable                            Communication Communication Communication: No apparent difficulties  Cognition Arousal: Alert Behavior During Therapy: WFL for tasks assessed/performed, Agitated                             Following commands: Intact Following commands impaired: Only follows one step commands consistently    Cueing Cueing Techniques: Verbal cues  Exercises General Exercises - Lower Extremity Quad Sets: 20 reps, Left, Supine Long Arc Quad: 20 reps, Left, Seated Straight Leg Raises: 5 reps, Supine, Strengthening    General Comments        Pertinent Vitals/Pain Pain Assessment Pain Assessment: 0-10 Pain Score: 7  Pain Location: R AKA revision Pain Intervention(s): Limited activity within patient's tolerance, Monitored during session, Repositioned    Home Living                          Prior Function            PT Goals (current goals can now be found in the care plan section) Acute Rehab PT Goals Patient Stated Goal: return home after rehab PT Goal Formulation: With patient Time For Goal Achievement: 06/06/23 Potential to Achieve Goals: Fair    Frequency    Min 3X/week      PT Plan      Co-evaluation              AM-PAC PT "6 Clicks" Mobility   Outcome Measure  Help needed turning from your back to your side while in a flat bed without using bedrails?: A Little Help needed moving from lying on your back to sitting on the side of a flat bed without using bedrails?: A Little Help needed moving to and from a bed to a chair (including a wheelchair)?: Total Help needed standing up from a chair using your arms (e.g., wheelchair or bedside chair)?: Total Help needed to walk in hospital room?: Total Help needed climbing 3-5 steps with a railing? : Total 6 Click Score: 10    End of Session Equipment Utilized During Treatment: Gait belt Activity Tolerance: Patient  limited by fatigue;Patient limited by pain Patient left: in bed;with call bell/phone within reach;with bed alarm set Nurse Communication: Mobility status PT Visit Diagnosis: Other abnormalities of gait and mobility (R26.89);Pain;Muscle weakness (generalized) (M62.81) Pain - Right/Left: Right     Time: 4098-1191 PT Time Calculation (min) (ACUTE ONLY): 15 min  Charges:    $Therapeutic Activity: 8-22 mins PT General Charges $$ ACUTE PT VISIT: 1 Visit                     Elie Goody, DPT [facility] [place] 336 [phone] office   Nelida Meuse 05/25/2023, 3:58 PM

## 2023-05-25 NOTE — TOC Progression Note (Signed)
 Transition of Care (TOC) - Progression Note    Patient Details  Name: [name]. MRN: [id number] Date of Birth: 19-Nov-1970  Transition of Care [facility]) CM/SW Contact  Karn Cassis, [place] Phone Number: 05/25/2023, 3:19 PM  Clinical Narrative:  SNF authorization received at 3:00 this afternoon. LCSW notified SNF. SNF unable to accept pt this evening due to not being able to set up transportation for pt's dialysis tomorrow at 5:00 am. Pt will d/c after dialysis tomorrow. MD updated.      Expected Discharge Plan: Skilled Nursing Facility Barriers to Discharge: Other (must enter comment) (D/C too late for transportation to be arranged for dialysis in AM)  Expected Discharge Plan and Services In-house Referral: Clinical Social Work   Post Acute Care Choice: Skilled Nursing Facility Living arrangements for the past 2 months: Single Family Home                                       Social Determinants of Health (SDOH) Interventions SDOH Screenings   Food Insecurity: No Food Insecurity (05/22/2023)  Housing: High Risk (05/22/2023)  Transportation Needs: No Transportation Needs (05/22/2023)  Utilities: Not At Risk (05/22/2023)  Financial Resource Strain: High Risk (07/05/2017)  Physical Activity: Inactive (07/05/2017)  Social Connections: Socially Integrated (05/05/2023)  Stress: Stress Concern Present (07/05/2017)  Tobacco Use: Low Risk  (05/22/2023)  Health Literacy: Low Risk  (06/01/2020)   Received from [facility], [facility]    Readmission Risk Interventions    05/17/2023    3:44 PM 07/27/2021   11:49 AM 11/27/2020    3:06 PM  Readmission Risk Prevention Plan  Transportation Screening Complete Complete Complete  PCP or Specialist Appt within 3-5 Days   Complete  HRI or Home Care Consult   Complete  Social Work Consult for Recovery Care Planning/Counseling   Complete  Palliative Care Screening   Not Applicable  Medication Review Oceanographer)  Referral to Pharmacy Complete Complete  PCP or Specialist appointment within 3-5 days of discharge Complete    HRI or Home Care Consult Complete    SW Recovery Care/Counseling Consult Complete Complete   Palliative Care Screening Not Applicable Not Applicable   Skilled Nursing Facility Not Applicable Not Applicable

## 2023-05-25 NOTE — Progress Notes (Signed)
 [facility] PROGRESS NOTE  [name]. (DOB: 10-24-1970) ZOX:[id number] PCP: [name], FNP Outpatient Specialists: Orthopedics, [name]  Brief Narrative: Daivd Fredericksen. is a 53 y.o. male with a history of ESRD (HD on TTS thru LUA AVF), PAD s/p multiple amputations including right AKA s/p recent revision 3/19, history of CVA, CAD s/p PCI, T2DM, PAF, HLD, chronic HFpEF, seizure disorder, and recent admission requiring chest tube for parapneumonia pleural effusion who presented to the ED on 05/22/2023 with weakness. Of note, he had been admitted at [facility] 3/13 - 3/25 and despite having SNF recommended and a peer to peer completed, his insurance did not authorize SNF placement, so he was sent home with maximal home health services. Here he was found to have hgb 7g/dl without active bleeding, BUN 110, he had missed HD 3/29 due to weakness. CXR showed improving RLL opacity.   Subjective: -Right AKA stump discomfort much improved -Participated in physical therapy today - Peer to peer insurance conversation with insurance physician--anticipate discharge to SNF on 05/26/2023 after hemodialysis  Objective: BP (!) 158/74 (BP Location: Right Arm)   Pulse 65   Temp 97.8 F (36.6 C) (Oral)   Resp 18   Ht 6\' 3"  (1.905 m)   Wt 75.3 kg   SpO2 100%   BMI 20.75 kg/m     Physical Exam  Gen:- Awake Alert, in no acute distress  HEENT:- .AT,, left eye enucleated Neck-Supple Neck,No JVD,.  Lungs-  CTAB , fair air movement bilaterally  CV- S1, S2 normal, RRR Abd-  +ve B.Sounds, Abd Soft, No tenderness,    Psych-affect is appropriate, oriented x3 Neuro-generalized weakness, no new focal deficits, no tremors Ext: Warm, dry, LUA AVF +T/B. R AKA incision has well-apposed wound edges with sutures in placed, dried blood without significant tenderness, erythema or discharge. Left heel wound has black eschar and no exudate/odor or tenderness.   Assessment & Plan: Generalized weakness:  Likely from deconditioning from recent prolonged hospitalization. No focal deficits.  -Prior to admission patient was able to transfer after his AKA and recent rehab stay  -At this time patient has significant weakness and is unable to transfer -PT eval appreciated recommends SNF rehab -Peer to peer insurance conversation on 05/25/23 with insurance physician--anticipate discharge to SNF on 05/26/2023 after hemodialysis   Acute on chronic anemia of ESRD and AOCD and ABLA s/p AKA revision: Received 3u RBCs during last hospitalization, DC hgb 7.3, was 7.0 on presentation to ED. No ongoing bleeding. Last colonoscopy 01/2023 poor prep, congested mucosa, internal and external hemorrhoids -Previously received  1u RBCs given persistent weakness, level 7g/dl.  - PPI, monitor CBC   ESRD:  -Nephrology consult appreciated -Next HD on 05/26/2023  Hyperkalemia:  - Lokelma - HD - Monitor in AM   CAD/ H/o prior STEMI 02/2020 s/p DES to LAD: Troponin elevated to 252 >> 261.  In the setting of missed HD and chronic anemia.  Patient denies chest pain, no dyspnea.  EKG without ischemic changes. This is consistent with demand myocardial ischemia.  - Resume aspirin, atorvastatin   Hx right AKA: s/p revision by Dr. Alvie Heidelberg. 3/22- Stump swelling was noted, sutures were released with large amount of blood noted.  Blood loss likely contributing to patient's chronic anemia. Wound appears uninfected. - Routine wound care and orthopedics follow up.    Left heel wound: Has boot - Offload - Optimize nutritional status - WOC consulted  Chronic HFpEF: G2DD, LVEF 55-60% on last echo.  - Volume  management per HD    History of seizure:  - Continue keppra    T2DM: Diet-controlled.  - Monitor glucose daily.     PAF: NSR on admission. Not on anticoagulation due to history of ICH. - Continue rhythm control with amiodarone  Elevated TSH: Very mild.  - Suggest recheck after convalescence.   Shon Hale, MD [facility] [email or web address] 05/25/2023, 7:00 PM

## 2023-05-25 NOTE — Consult Note (Addendum)
 WOC Nurse Consult Note: patient with history of PAD with multiple amputations  Reason for Consult: L heel wound  Wound type: Unstageable Pressure Injury  Pressure Injury POA: Yes Measurement: see nursing flowsheet  Wound bed:100% black brown eschar  Drainage (amount, consistency, odor) see nursing flowsheet  Periwound: dry sloughing skin  Dressing procedure/placement/frequency:  Cleanse L heel with soap and water, dry and apply Xeroform gauze daily to wound bed, cover with dry gauze and Kerlix roll gauze.  Place L foot in Prevalon boot [place] [phone] to offload pressure.   Patient is followed outpatient by [name].    POC discussed with bedside nurse. WOC team will not follow. Re-consult if further needs arise.   Thank you,    Priscella Mann MSN, RN-BC, [employer] [phone]

## 2023-05-26 DIAGNOSIS — Z1152 Encounter for screening for COVID-19: Secondary | ICD-10-CM | POA: Diagnosis not present

## 2023-05-26 DIAGNOSIS — N186 End stage renal disease: Secondary | ICD-10-CM | POA: Diagnosis not present

## 2023-05-26 DIAGNOSIS — Z992 Dependence on renal dialysis: Secondary | ICD-10-CM | POA: Diagnosis not present

## 2023-05-26 DIAGNOSIS — R531 Weakness: Secondary | ICD-10-CM | POA: Diagnosis not present

## 2023-05-26 LAB — GLUCOSE, CAPILLARY: Glucose-Capillary: 77 mg/dL (ref 70–99)

## 2023-05-26 LAB — CBC
HCT: 26.2 % — ABNORMAL LOW (ref 39.0–52.0)
Hemoglobin: 7.8 g/dL — ABNORMAL LOW (ref 13.0–17.0)
MCH: 23.1 pg — ABNORMAL LOW (ref 26.0–34.0)
MCHC: 29.8 g/dL — ABNORMAL LOW (ref 30.0–36.0)
MCV: 77.7 fL — ABNORMAL LOW (ref 80.0–100.0)
Platelets: 258 10*3/uL (ref 150–400)
RBC: 3.37 MIL/uL — ABNORMAL LOW (ref 4.22–5.81)
RDW: 23.7 % — ABNORMAL HIGH (ref 11.5–15.5)
WBC: 11.4 10*3/uL — ABNORMAL HIGH (ref 4.0–10.5)
nRBC: 0 % (ref 0.0–0.2)

## 2023-05-26 LAB — RENAL FUNCTION PANEL
Albumin: 2.2 g/dL — ABNORMAL LOW (ref 3.5–5.0)
Anion gap: 16 — ABNORMAL HIGH (ref 5–15)
BUN: 100 mg/dL — ABNORMAL HIGH (ref 6–20)
CO2: 21 mmol/L — ABNORMAL LOW (ref 22–32)
Calcium: 8.9 mg/dL (ref 8.9–10.3)
Chloride: 101 mmol/L (ref 98–111)
Creatinine, Ser: 12.71 mg/dL — ABNORMAL HIGH (ref 0.61–1.24)
GFR, Estimated: 4 mL/min — ABNORMAL LOW (ref >60)
Glucose, Bld: 95 mg/dL (ref 70–99)
Phosphorus: 7.7 mg/dL — ABNORMAL HIGH (ref 2.5–4.6)
Potassium: 5.1 mmol/L (ref 3.5–5.1)
Sodium: 138 mmol/L (ref 135–145)

## 2023-05-26 MED ORDER — AMIODARONE HCL 200 MG PO TABS: 200.0000 mg | ORAL_TABLET | Freq: Every day | ORAL | 2 refills | Status: DC

## 2023-05-26 MED ORDER — LIDOCAINE-PRILOCAINE 2.5-2.5 % EX CREA: 1.0000 | TOPICAL_CREAM | CUTANEOUS | Status: DC | PRN

## 2023-05-26 MED ORDER — OXYCODONE HCL 5 MG PO TABS: 5.0000 mg | ORAL_TABLET | ORAL | 0 refills | Status: DC | PRN

## 2023-05-26 MED ORDER — ACETAMINOPHEN 325 MG PO TABS: 650.0000 mg | ORAL_TABLET | Freq: Four times a day (QID) | ORAL | Status: DC | PRN

## 2023-05-26 MED ORDER — ASPIRIN 81 MG PO TBEC: 81.0000 mg | DELAYED_RELEASE_TABLET | Freq: Every day | ORAL | 1 refills | Status: DC

## 2023-05-26 MED ORDER — LEVETIRACETAM 750 MG PO TABS: 750.0000 mg | ORAL_TABLET | ORAL | 5 refills | Status: DC

## 2023-05-26 MED ORDER — LIDOCAINE HCL (PF) 1 % IJ SOLN: 5.0000 mL | INTRAMUSCULAR | Status: DC | PRN

## 2023-05-26 MED ORDER — ATORVASTATIN CALCIUM 80 MG PO TABS: 80.0000 mg | ORAL_TABLET | Freq: Every day | ORAL | 2 refills | Status: DC

## 2023-05-26 MED ORDER — LEVETIRACETAM 500 MG PO TABS: 500.0000 mg | ORAL_TABLET | ORAL | 3 refills | Status: DC

## 2023-05-26 MED ORDER — PENTAFLUOROPROP-TETRAFLUOROETH EX AERO
1.0000 | INHALATION_SPRAY | CUTANEOUS | Status: DC | PRN
  Filled 2023-05-26: qty 30

## 2023-05-26 MED ORDER — CARVEDILOL 12.5 MG PO TABS: 12.5000 mg | ORAL_TABLET | Freq: Two times a day (BID) | ORAL | 5 refills | Status: DC

## 2023-05-26 MED ORDER — NEPRO/CARBSTEADY PO LIQD: 237.0000 mL | ORAL | Status: DC | PRN

## 2023-05-26 MED ORDER — POLYETHYLENE GLYCOL 3350 17 G PO PACK: 17.0000 g | PACK | Freq: Every day | ORAL | 4 refills | Status: DC

## 2023-05-26 NOTE — Discharge Instructions (Signed)
 1)Avoid ibuprofen/Advil/Aleve/Motrin/Goody Powders/Naproxen/BC powders/Meloxicam/Diclofenac/Indomethacin and other Nonsteroidal anti-inflammatory medications as these will make you more likely to bleed and can cause stomach ulcers, can also cause Kidney problems.  2)Wound Care---Cleanse L heel with soap and water, dry and apply Xeroform gauze daily to wound bed, cover with dry gauze and Kerlix roll gauze.  Place L foot in Prevalon boot [place] [phone]) to offload pressure.  3)Repeat CBC and BMP on Monday 05/31/23

## 2023-05-26 NOTE — Plan of Care (Signed)
   Problem: Education: Goal: Knowledge of General Education information will improve Description Including pain rating scale, medication(s)/side effects and non-pharmacologic comfort measures Outcome: Progressing

## 2023-05-26 NOTE — Discharge Summary (Signed)
 [name] Gabriela Giannelli., is a 52 y.o. male  DOB 12-22-1970  MRN [id number].  Admission date:  05/22/2023  Admitting Physician  Onnie Boer, MD  Discharge Date:  05/26/2023   Primary MD  [name], FNP  Recommendations for primary care physician for things to follow:  1)Avoid ibuprofen/Advil/Aleve/Motrin/Goody Powders/Naproxen/BC powders/Meloxicam/Diclofenac/Indomethacin and other Nonsteroidal anti-inflammatory medications as these will make you more likely to bleed and can cause stomach ulcers, can also cause Kidney problems.  2)Wound Care---Cleanse L heel with soap and water, dry and apply Xeroform gauze daily to wound bed, cover with dry gauze and Kerlix roll gauze.  Place L foot in Prevalon boot [facility] [place] [phone]) to offload pressure.  3)Repeat CBC and BMP on Monday 05/31/23  Admission Diagnosis  Weakness [R53.1] Elevated troponin [R79.89] Generalized weakness [R53.1] Ambulatory dysfunction [R26.2] Anemia, unspecified type [D64.9] Chronic kidney disease, unspecified CKD stage [N18.9]   Discharge Diagnosis  Weakness [R53.1] Elevated troponin [R79.89] Generalized weakness [R53.1] Ambulatory dysfunction [R26.2] Anemia, unspecified type [D64.9] Chronic kidney disease, unspecified CKD stage [N18.9]    Principal Problem:   Generalized weakness Active Problems:   CAD/ H/o prior STEMI 02/2020 s/p DES to LAD   ESRD on hemodialysis (HCC)   Acute on chronic anemia   Chronic Grade 2 diastolic heart failure/EF 55 to 60%   Hx of AKA (above knee amputation), right (HCC)   Blindness of left eye   History of CVA (cerebrovascular accident) - with ICH while on plavix and Eliquis in 11-2021.   History of seizure   Diabetes mellitus, type II (HCC)      Past Medical History:  Diagnosis Date   Acute Bil  CVAs of Bil Frontal amd Rt Parietal  in Watershed Distribution 04/21/2021   Patchy acute cortically-based  infarcts within the bilateral high  frontal lobes and right parietal lobe (predominantly in a watershed  distribution).   Subcentimeter acute infarct within the callosal splenium on the  left.     Acute bilateral cerebral infarction in a watershed distribution [facility]) 04/21/2021   a.) MRI brain 04/21/2021 --> patchy ACUTE cortically-based infarcts within the bilateral high frontal lobes and right parietal lobe   Acute cerebral infarction (HCC) 02/13/2021   a.) MRI brain 02/13/2021: acute LEFT hippocampal infarct   Acute cerebral infarction (HCC) 04/21/2021   a.) MRI brain 04/21/2021: subcentimeter ACUTE infarct within the callosal splenium on the LEFT   Acute osteomyelitis of toe of right foot with gangrene (HCC) 02/26/2022   Anaphylactic reaction due to adverse effect of correct drug or medicament properly administered, initial encounter 05/08/2019   Anemia of chronic renal failure    Anxiety    Aortic dilatation (HCC) 11/25/2020   a.) TTE 11/25/2020: Ao root measured 38 mm. b.) TTE 04/22/2021: Ao root measured 44 mm; c. 11/2021 Echo: Ao root 40mm.   Atrial fibrillation (HCC)    a.) CHA2DS2-VASc = 6 (CHF, HTN, CVA x2, prior MI, T2DM). b.) rate/rhythm maintained on oral amiodarone + carvedilol; chronic OAC/AP therapy discontinued following ICH   Benign prostatic hyperplasia  with lower urinary tract symptoms 11/07/2019   BPH (benign prostatic hyperplasia)    Cardiac arrest (HCC) 07/26/2021   a.) in setting of hyperkalemia, NSTEMI, and seizure following missed HD; pulseless and apneic --> required 1 round of CPR prior to ROSC   Cellulitis    Cerebral hemorrhage (HCC) 11/21/2021   a.) CT head 11/21/2021 --> 1.4 x 1.5 x 3.1 cm ACUTE hemorrhage within the inferior aspect of the fourth ventricle, and extending inferiorly through the foramen of foramen of [place] --> occurred in setting of HTN emergency and DOAC/APT-->eliquis/plavix d/c'd.   Cerebral microvascular disease    Chronic diarrhea     Chronic heart failure with preserved ejection fraction (HFpEF) (HCC) 07/19/2017   a.) 06/2017 Echo: EF 75%; b.) 11/2020 Echo: EF 55-60%; c.) 04/2021 Echo: EF 55-60%; d.) 11/2021 Echo: EF 65-70%, GrII DD, nl RV fxn, sev dil LA, large circumferential pericardial eff w/o tamponade, triv MR, AoV sclerosis. Ao root 40mm; e.) TTE 02/28/2025: EF 60-65%, sev LVH, mod posterior LV effusion, AoV sclerosis   Chronic pain of both ankles    Coronary artery disease 02/26/2020   a. 02/2020 Ant STEMI/PCI: LM nl, LAD 29m (3.5x30 Resolute Onyx), RI nl, LCX min irregs, RCA min irregs. EF 65%.   DDD (degenerative disc disease), cervical    Diabetes mellitus, type II (HCC)    Diabetic neuropathy (HCC)    Esophagitis    ESRD (end stage renal disease) on dialysis (HCC)    a.) [facility]; T-Th-Sat   Frequent falls    Gait instability    Gastritis    Gastroparesis    GERD (gastroesophageal reflux disease)    H/O enucleation of left eyeball    Heart palpitations    History of kidney stones    HLD (hyperlipidemia)    Hypertension    Insomnia    a.) uses melatonin PRN   Long term current use of amiodarone    Nausea and vomiting in adult    recurrent   NSTEMI (non-ST elevated myocardial infarction) (HCC) 07/26/2021   a.) hyperkalemic from missed HD; seizures; decompensated to cardiac arrest and required CPR x 1 round prior to ROSC.   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  Added automatically from request for surgery [id number]     Osteomyelitis [facility])    Pericardial effusion    a. 11/2021 Echo: EF 65-70%, GrII DD, nl RV fxn, sev dil LA, large circumferential pericardial eff w/o tamponade, triv MR, AoV sclerosis. Ao root 40mm.   Seizure (HCC)    a.) last 07/26/2021 in setting of missed HD --> hyperkalemic at 6.5 --> pulseless/apneic and required CPR; discharged home on levetiracetam.   ST elevation myocardial infarction (STEMI) of  anterior wall (HCC) 02/26/2020   a.) LHC/PCI 02/26/2020 --> EF 65%; LVEDP 11 mmHg; 90% mLAD (3.5 x 20 mm Resolute Onyx DES x 1)    Past Surgical History:  Procedure Laterality Date   A/V FISTULAGRAM Left 08/31/2021   Procedure: A/V Fistulagram;  Surgeon: Annice Needy, MD;  Location: [name] INVASIVE CV LAB;  Service: Cardiovascular;  Laterality: Left;   A/V FISTULAGRAM Left 01/25/2022   Procedure: A/V Fistulagram;  Surgeon: Annice Needy, MD;  Location: [name] INVASIVE CV LAB;  Service: Cardiovascular;  Laterality: Left;   A/V FISTULAGRAM Right 10/18/2022   Procedure: A/V Fistulagram;  Surgeon: Annice Needy, MD;  Location: [name] INVASIVE CV LAB;  Service: Cardiovascular;  Laterality: Right;  ABDOMINAL AORTOGRAM W/LOWER EXTREMITY Bilateral 02/08/2023   Procedure: ABDOMINAL AORTOGRAM W/LOWER EXTREMITY;  Surgeon: Daria Pastures, MD;  Location: [facility] INVASIVE CV LAB;  Service: Vascular;  Laterality: Bilateral;   AMPUTATION Left 03/16/2016   Procedure: AMPUTATION DIGIT LEFT HALLUX;  Surgeon: Vivi Barrack, DPM;  Location: [facility] OR;  Service: Podiatry;  Laterality: Left;  can start around 5    AMPUTATION Right 02/09/2021   Procedure: AMPUTATION DIGIT;  Surgeon: Marlyne Beards, MD;  Location: [facility] OR;  Service: Orthopedics;  Laterality: Right;   AMPUTATION Right 02/04/2023   Procedure: AMPUTATION 4TH AND 5TH METATARSAL;  Surgeon: Pilar Plate, DPM;  Location: [facility] OR;  Service: Orthopedics/Podiatry;  Laterality: Right;  I&D, partial ray resection, abx beads   AMPUTATION Right 02/09/2023   Procedure: RIGHT BELOW KNEE AMPUTATION;  Surgeon: [name], MD;  Location: [facility] OR;  Service: Orthopedics;  Laterality: Right;   AMPUTATION Right 04/06/2023   Procedure: RIGHT ABOVE KNEE AMPUTATION;  Surgeon: [name], MD;  Location: [facility] OR;  Service: Orthopedics;  Laterality: Right;   AMPUTATION Right 05/11/2023   Procedure: RIGHT ABOVE KNEE AMPUTATION;  Surgeon: [name], MD;  Location: [facility] OR;   Service: Orthopedics;  Laterality: Right;  REVISION RIGHT ABOVE KNEE AMPUTATION   AMPUTATION TOE Right 02/28/2022   Procedure: AMPUTATION TOE;  Surgeon: Louann Sjogren, DPM;  Location: [name] ORS;  Service: Podiatry;  Laterality: Right;  right fifth toe   ARTHROSCOPIC REPAIR ACL Left    AV FISTULA PLACEMENT Right 05/31/2019   Procedure: Brachiocephalic AV fistula creation;  Surgeon: Annice Needy, MD;  Location: [name] ORS;  Service: Vascular;  Laterality: Right;   AV FISTULA PLACEMENT Left 08/13/2021   Procedure: ARTERIOVENOUS (AV) FISTULA CREATION (BRACHIALCEPHALIC);  Surgeon: Annice Needy, MD;  Location: [name] ORS;  Service: Vascular;  Laterality: Left;   CHEST TUBE INSERTION Right 05/06/2023   Procedure: CHEST TUBE INSERTION;  Surgeon: Lorin Glass, MD;  Location: [facility] ENDOSCOPY;  Service: Pulmonary;  Laterality: Right;   COLONOSCOPY WITH PROPOFOL N/A 10/28/2015   Procedure: COLONOSCOPY WITH PROPOFOL;  Surgeon: Christena Deem, MD;  Location: [facility] ENDOSCOPY;  Service: Endoscopy;  Laterality: N/A;   COLONOSCOPY WITH PROPOFOL N/A 10/29/2015   Procedure: COLONOSCOPY WITH PROPOFOL;  Surgeon: Christena Deem, MD;  Location: [facility] ENDOSCOPY;  Service: Endoscopy;  Laterality: N/A;   COLONOSCOPY WITH PROPOFOL N/A 01/27/2023   Procedure: COLONOSCOPY WITH PROPOFOL;  Surgeon: Franky Macho, MD;  Location: [facility] ENDO SUITE;  Service: Endoscopy;  Laterality: N/A;  12:30pm, asa 3   CORONARY ULTRASOUND/IVUS N/A 02/26/2020   Procedure: Intravascular Ultrasound/IVUS;  Surgeon: Corky Crafts, MD;  Location: [facility] INVASIVE CV LAB;  Service: Cardiovascular;  Laterality: N/A;   CORONARY/GRAFT ACUTE MI REVASCULARIZATION N/A 02/26/2020   Procedure: Coronary/Graft Acute MI Revascularization;  Surgeon: Corky Crafts, MD;  Location: [facility] INVASIVE CV LAB;  Service: Cardiovascular;  Laterality: N/A;   DIALYSIS/PERMA CATHETER INSERTION Right 04/26/2019   Perm Cath    DIALYSIS/PERMA CATHETER INSERTION N/A 04/26/2019    Procedure: DIALYSIS/PERMA CATHETER INSERTION;  Surgeon: Annice Needy, MD;  Location: [name] INVASIVE CV LAB;  Service: Cardiovascular;  Laterality: N/A;   DIALYSIS/PERMA CATHETER INSERTION N/A 05/25/2021   Procedure: DIALYSIS/PERMA CATHETER INSERTION;  Surgeon: Annice Needy, MD;  Location: [name] INVASIVE CV LAB;  Service: Cardiovascular;  Laterality: N/A;   DIALYSIS/PERMA CATHETER INSERTION N/A 08/31/2021   Procedure: DIALYSIS/PERMA CATHETER INSERTION;  Surgeon: Annice Needy, MD;  Location: [name] INVASIVE CV LAB;  Service:  Cardiovascular;  Laterality: N/A;   DIALYSIS/PERMA CATHETER REMOVAL N/A 08/15/2019   Procedure: DIALYSIS/PERMA CATHETER REMOVAL;  Surgeon: Renford Dills, MD;  Location: [name] INVASIVE CV LAB;  Service: Cardiovascular;  Laterality: N/A;   DIALYSIS/PERMA CATHETER REMOVAL N/A 03/15/2022   Procedure: DIALYSIS/PERMA CATHETER REMOVAL;  Surgeon: Annice Needy, MD;  Location: [name] INVASIVE CV LAB;  Service: Cardiovascular;  Laterality: N/A;   EMBOLIZATION (CATH LAB) Right 02/06/2021   Procedure: EMBOLIZATION;  Surgeon: Annice Needy, MD;  Location: [name] INVASIVE CV LAB;  Service: Cardiovascular;  Laterality: Right;  Right Upper Extremity Dialysis Access, Permcath Placement   ESOPHAGOGASTRODUODENOSCOPY (EGD) WITH PROPOFOL N/A 12/27/2017   Procedure: ESOPHAGOGASTRODUODENOSCOPY (EGD) WITH PROPOFOL;  Surgeon: Toledo, Boykin Nearing, MD;  Location: [name] ENDOSCOPY;  Service: Gastroenterology;  Laterality: N/A;   EYE SURGERY     IR THORACENTESIS ASP PLEURAL SPACE W/IMG GUIDE  05/05/2023   LEFT HEART CATH AND CORONARY ANGIOGRAPHY N/A 02/26/2020   Procedure: LEFT HEART CATH AND CORONARY ANGIOGRAPHY;  Surgeon: Corky Crafts, MD;  Location: [facility] INVASIVE CV LAB;  Service: Cardiovascular;  Laterality: N/A;   PROSTATE SURGERY  2016   REVISON OF ARTERIOVENOUS FISTULA Right 07/22/2022   Procedure: RESECTION OF ANEURYSMAL RIGHT ARM ARTERIOVENOUS FISTULA;  Surgeon: Annice Needy, MD;  Location: [name] ORS;   Service: Vascular;  Laterality: Right;   TONSILECTOMY/ADENOIDECTOMY WITH MYRINGOTOMY     TONSILLECTOMY     UPPER EXTREMITY ANGIOGRAPHY Right 11/26/2020   Procedure: Upper Extremity Angiography;  Surgeon: Annice Needy, MD;  Location: [name] INVASIVE CV LAB;  Service: Cardiovascular;  Laterality: Right;   UPPER EXTREMITY ANGIOGRAPHY Right 02/05/2021   Procedure: UPPER EXTREMITY ANGIOGRAPHY;  Surgeon: Annice Needy, MD;  Location: [name] INVASIVE CV LAB;  Service: Cardiovascular;  Laterality: Right;     HPI  from the history and physical done on the day of admission:   Chief Complaint: Weakness   HPI: [name] Baer Hinton. is a 53 y.o. male with medical history significant for ESRD on dialysis T Th Sat, right above-knee amputation, hypertension, seizure, paroxysmal A-fib, blind in left eye, diastolic CHF. Patient presented to the ED with complaints of generalized weakness over the past few days.  He went for dialysis yesterday, he was not dialyzed because were unable to transfer him onto the dialysis chair, patient reports he left his 'lift" at home.  2 episodes of vomiting 2 days ago but no blood. Chronic diarrhea that is unchanged.  No black stools no bloody stools. . No chest pain, no cough, no chest pain or difficulty breathing.   Hospitalized 3/13- 3/25 for all effusion, he underwent thoracentesis by IR with drainage of 1.5 L of fluid thought to be parapneumonic effusion trauma related.  Subsequent CT showed very large persistent right effusion.  Patient had pigtail chest tube placed by pulmonology with improved drainage, and then had light 6 infused per chest tube with resolution of oral effusion.  Cytology did not show malignant cells. He also had right AKA stump wound dehiscence and underwent right AKA revision 8/19.  Subsequently large amount of blood collected in the stump that was when sutures were released- 3/22. Received total of 3 units PRBC during specialization.   Was subsequently  discharged to rehab and has been home for about a week now.   ED Course: Temperature 98.2.  Heart rate 58-60.  Respirate rate 8 - 16.  Blood pressure systolic 117-143. O2 sats greater than 95% on room air. Chest x-ray shows patchy right lower lobe opacity  that is improving. Troponin 252 > 261.  BUN elevated 110. EKG unchanged. Hospitalist admit for symptomatic anemia, missed hemodialysis   Review of Systems: As per HPI all other systems reviewed and negative.   Hospital Course:   Assessment and Plan: 1)Generalized weakness: Likely from deconditioning from recent prolonged hospitalization.  -No new focal deficits.  -Prior to admission patient was able to transfer after his Rt AKA and recent rehab stay  -At this time patient has significant weakness and is unable to transfer safely -PT eval appreciated recommends SNF rehab -Peer to peer insurance conversation on 05/25/23 with insurance physician--anticipate discharge to SNF on 05/26/2023 after hemodialysis   2)Acute on chronic anemia of ESRD and AOCD and ABLA s/p AKA revision: Received 3u RBCs during last hospitalization, DC hgb 7.3, was 7.0 on presentation to ED. No ongoing bleeding. Last colonoscopy 01/2023 poor prep, congested mucosa, internal and external hemorrhoids -Previously received  1u RBCs given persistent weakness, level 7g/dl.  -Repeat CBC on Monday, 05/31/2023   3)ESRD:  -Nephrology consult appreciated - Tolerated  HD well on 05/26/2023 -Continue HD per TTS schedule   4)Hyperkalemia: Managed with [facility] and hemodialysis -Repeat BMP on Monday, 05/31/2023    CAD/ H/o prior STEMI 02/2020 s/p DES to LAD: Troponin elevated to 252 >> 261.  In the setting of missed HD and chronic anemia.  Patient denies chest pain, no dyspnea.  EKG without ischemic changes. This is consistent with demand myocardial ischemia.  -c/n  aspirin, atorvastatin   Hx right AKA: s/p revision by Dr. Alvie Heidelberg. 3/22- Stump swelling was noted, sutures were  released with large amount of blood noted.  Blood loss likely contributing to patient's chronic anemia. Wound appears uninfected. - Routine wound care and outpatient orthopedics follow up.     Left heel wound: Has boot - Offload - Optimize nutritional status - WOC consult noted--recommends Cleanse L heel with soap and water, dry and apply Xeroform gauze daily to wound bed, cover with dry gauze and Kerlix roll gauze. Place L foot in Prevalon boot [facility] [place] [phone]) to offload pressure.  -Wound care specialist to follow at SNF facility   Chronic HFpEF: G2DD, LVEF 55-60% on last echo.  - Volume management per HD    History of seizure:  - Continue keppra    T2DM: Diet-controlled.  - Monitor glucose daily.     PAF: NSR on admission. Not on anticoagulation due to history of ICH. - Continue rhythm control with amiodarone   Elevated TSH: Very mild.  - Suggest recheck after convalescence--in about 4 to 6 weeks  Discharge Condition: Stable  Follow UP   Contact information for after-discharge care     Destination     [facility] .   Service: Skilled Nursing Contact information: [address] [place] [address] [phone]                     Consults obtained -nephrologist  Diet and Activity recommendation:  As advised  Discharge Instructions    Discharge Instructions     Call MD for:  persistant dizziness or light-headedness   Complete by: As directed    Call MD for:  persistant nausea and vomiting   Complete by: As directed    Call MD for:  temperature >100.4   Complete by: As directed    Diet - low sodium heart healthy   Complete by: As directed    Discharge instructions   Complete by: As directed  1)Avoid ibuprofen/Advil/Aleve/Motrin/Goody Powders/Naproxen/BC powders/Meloxicam/Diclofenac/Indomethacin and other Nonsteroidal anti-inflammatory medications as these will make you more likely to bleed and can cause  stomach ulcers, can also cause Kidney problems.  2)Wound Care---Cleanse L heel with soap and water, dry and apply Xeroform gauze daily to wound bed, cover with dry gauze and Kerlix roll gauze.  Place L foot in Prevalon boot [facility] [place] [phone]) to offload pressure.  3)Repeat CBC and BMP on Monday 05/31/23   Discharge wound care:   Complete by: As directed    Cleanse L heel with soap and water, dry and apply Xeroform gauze daily to wound bed, cover with dry gauze and Kerlix roll gauze.  Place L foot in Prevalon boot [facility] [place] [phone]) to offload pressure.   Increase activity slowly   Complete by: As directed         Discharge Medications     Allergies as of 05/26/2023       Reactions   Promethazine Diarrhea, Other (See Comments)   Muscle cramps, cramping "all over"        Medication List     TAKE these medications    (feeding supplement) PROSource Plus liquid Take 30 mLs by mouth 3 (three) times daily between meals.   nutrition supplement (JUVEN) Pack Take 1 packet by mouth 2 (two) times daily between meals.   acetaminophen 325 MG tablet Commonly known as: TYLENOL Take 2 tablets (650 mg total) by mouth every 6 (six) hours as needed for mild pain (pain score 1-3) or fever (or Fever >/= 101). What changed:  how much to take when to take this reasons to take this   amiodarone 200 MG tablet Commonly known as: PACERONE Take 1 tablet (200 mg total) by mouth daily. What changed: when to take this   ascorbic acid 500 MG tablet Commonly known as: VITAMIN C Take 1,000 mg by mouth daily.   aspirin EC 81 MG tablet Take 1 tablet (81 mg total) by mouth daily with breakfast. Swallow whole. What changed: when to take this   atorvastatin 80 MG tablet Commonly known as: LIPITOR Take 1 tablet (80 mg total) by mouth at bedtime.   calcitRIOL 0.25 MCG capsule Commonly known as: ROCALTROL Take 9 capsules (2.25 mcg total) by mouth every Tuesday, Thursday, and Saturday at 6 PM.   calcium  acetate 667 MG capsule Commonly known as: PHOSLO Take 2 capsules (1,334 mg total) by mouth 3 (three) times daily with meals.   carvedilol 12.5 MG tablet Commonly known as: COREG Take 1 tablet (12.5 mg total) by mouth 2 (two) times daily.   cinacalcet 30 MG tablet Commonly known as: SENSIPAR Take 2 tablets (60 mg total) by mouth every Tuesday, Thursday, and Saturday at 6 PM.   dorzolamide-timolol 2-0.5 % ophthalmic solution Commonly known as: COSOPT Place 1 drop into the right eye 2 (two) times daily.   levETIRAcetam 500 MG tablet Commonly known as: KEPPRA Take 1 tablet (500 mg total) by mouth 4 (four) times a week. On Sunday, Monday, Wednesday and Friday, all non-dialysis days   levETIRAcetam 750 MG tablet Commonly known as: KEPPRA Take 1 tablet (750 mg total) by mouth 3 (three) times a week. On Tuesday Thursday and Saturday, on dialysis days Start taking on: May 27, 2023   lidocaine-prilocaine cream Commonly known as: EMLA Apply 1 Application topically 3 (three) times a week.   loperamide 2 MG capsule Commonly known as: IMODIUM Take 1 capsule (2 mg total) by mouth as needed for diarrhea or loose stools.  multivitamin Tabs tablet Take 1 tablet by mouth at bedtime.   ondansetron 4 MG tablet Commonly known as: ZOFRAN Take 1 tablet (4 mg total) by mouth every 6 (six) hours as needed for nausea or vomiting.   oxyCODONE 5 MG immediate release tablet Commonly known as: Oxy IR/ROXICODONE Take 1 tablet (5 mg total) by mouth every 4 (four) hours as needed for moderate pain (pain score 4-6).   polyethylene glycol 17 g packet Commonly known as: MIRALAX / GLYCOLAX Take 17 g by mouth daily.   zinc sulfate (50mg  elemental zinc) 220 (50 Zn) MG capsule Take 220 mg by mouth daily.               Discharge Care Instructions  (From admission, onward)           Start     Ordered   05/26/23 0000  Discharge wound care:       Comments: Cleanse L heel with soap and water,  dry and apply Xeroform gauze daily to wound bed, cover with dry gauze and Kerlix roll gauze.  Place L foot in Prevalon boot [facility] [place] [phone]) to offload pressure.   05/26/23 1108            Major procedures and Radiology Reports - PLEASE review detailed and final reports for all details, in brief -   DG Femur Min 2 Views Right Result Date: 05/22/2023 CLINICAL DATA:  Stump infection EXAM: RIGHT FEMUR 2 VIEWS COMPARISON:  05/05/2023 FINDINGS: 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 Sharp resection margin without erosion or periosteal elevation. No fracture or dislocation. Severe atherosclerotic calcifications. Soft tissue prominence of the distal stump. No soft tissue gas. 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 No radiographic evidence of osteomyelitis. Electronically Signed   By: Duanne Guess D.O.   On: 05/22/2023 14:47   DG Chest Port 1 View Result Date: 05/22/2023 CLINICAL DATA:  Weakness EXAM: PORTABLE CHEST 1 VIEW COMPARISON:  05/11/2023 FINDINGS: Stable cardiomegaly. Patchy right lower lobe opacity, improving. Small right pleural effusion, minimally improved. Left lung is clear. No pneumothorax. IMPRESSION: Patchy right lower lobe opacity, improving. Small right pleural effusion, minimally improved. Electronically Signed   By: Duanne Guess D.O.   On: 05/22/2023 12:49   CT HEAD WO CONTRAST ( ) Result Date: 05/14/2023 CLINICAL DATA:  Mental status change of unknown cause.  Seizures. EXAM: CT HEAD WITHOUT CONTRAST TECHNIQUE: Contiguous axial images were obtained from the base of the skull through the vertex without intravenous contrast. RADIATION DOSE REDUCTION: This exam was performed according to the departmental dose-optimization program which includes automated exposure control, adjustment of the mA and/or kV according to patient size and/or use of iterative reconstruction technique. COMPARISON:  MRI brain  03/17/2023.  CT head 03/17/2023 FINDINGS: Brain: Diffuse cerebral atrophy. Ventricular dilatation is likely due to central atrophy. Low-attenuation changes in the deep white matter consistent with small vessel ischemia. Increasing low-attenuation changes extending to the cortical surface in the vertex bilaterally. This is similar to previous studies likely representing old infarcts or ischemia. No mass-effect or midline shift. No abnormal extra-axial fluid collections. Gray-white matter junctions are distinct. Basal cisterns are not effaced. No acute intracranial hemorrhage. Vascular: No hyperdense vessel or unexpected calcification. Skull: Normal. Negative for fracture or focal lesion. Sinuses/Orbits: Mild mucosal thickening in the paranasal sinuses. Partial opacification of mastoid air cells bilaterally. Left ocular prosthesis. Other: None. IMPRESSION: 1. No acute intracranial abnormalities. Chronic atrophy and small vessel  ischemic changes. Old infarcts bilaterally. 2. Bilateral mastoid effusions. Electronically Signed   By: Burman Nieves M.D.   On: 05/14/2023 00:38   DG Chest Port 1 View Result Date: 05/11/2023 CLINICAL DATA:  Pleural effusion. EXAM: PORTABLE CHEST 1 VIEW COMPARISON:  Chest radiograph dated 05/09/2023. FINDINGS: Interval removal of the right chest tube. Small right pleural effusion with right lung base atelectasis. Small fluid noted in the right fissure. No pneumothorax. Stable mild cardiomegaly. Coronary vascular calcification. No acute osseous pathology. IMPRESSION: 1. Interval removal of the right chest tube. No pneumothorax. 2. Small right pleural effusion with right lung base atelectasis. Electronically Signed   By: Elgie Collard M.D.   On: 05/11/2023 09:51   DG Chest Port 1 View Result Date: 05/09/2023 CLINICAL DATA:  [id number].  Pleural effusion. EXAM: PORTABLE CHEST 1 VIEW COMPARISON:  Portable chest yesterday at 6:19 a.m. FINDINGS: 4:09 p.m. Pigtail pleural tube in the lower  lateral right chest remains stable in positioning with adjacent right lateral basal opacity and overlying linear atelectasis. Streak atelectasis or infiltrate in the left lower lobe retrocardiac space is also unchanged. The remainder of the lungs are clear. There is stable cardiomegaly without evidence of CHF. Coronary artery stent again noted on left stable mediastinal of aeration. No new osseous abnormality. IMPRESSION: Stable overall aeration and chest tube positioning. No measurable pneumothorax. Electronically Signed   By: Almira Bar M.D.   On: 05/09/2023 20:06   DG Swallowing Func-Speech Pathology Result Date: 05/09/2023 Table formatting from the original result was not included. Modified Barium Swallow Study Patient Details Name: [name] Uddin. MRN: [id number] Date of Birth: March 14, 1970 Today's Date: 05/09/2023 HPI/PMH: HPI: [name] is a 53 year old resident of [facility] who presented to the [facility] with seizure-like activity which occurred during dialysis.  While in the ER the patient was discovered to have a large right pleural effusion. S/p chest tube by PCCM and concern for aspiration. CXR 3/15 with RLL consolidation concerning for atelectasis v pna. Pt with a medical history of ESRD on HD TTS, recent right BKA ([name]), CVA, atrial fibrillation not on anticoagulation, DM2, and seizure disorder Clinical Impression: Clinical Impression: Pt demonstrates normal swallow function. No penetration, aspiraiton or residue. No significant risk for postprandial aspiration identified with pt interview and esopahgeal sweep appeared WNL.  SLP will sign off. Factors that may increase risk of adverse event in presence of aspiration [name] & [name] 2021): No data recorded Recommendations/Plan: Swallowing Evaluation Recommendations Swallowing Evaluation Recommendations Recommendations: PO diet PO Diet Recommendation: Regular; Thin liquids (Level 0) Liquid Administration via: Cup; Straw Medication  Administration: Whole meds with liquid Supervision: Patient able to self-feed Treatment Plan Treatment Plan Treatment recommendations: No treatment recommended at this time Recommendations Recommendations for follow up therapy are one component of a multi-disciplinary discharge planning process, led by the attending physician.  Recommendations may be updated based on patient status, additional functional criteria and insurance authorization. Assessment: Orofacial Exam: No data recorded Anatomy: Anatomy: WFL Boluses Administered: Boluses Administered Boluses Administered: Thin liquids (Level 0); Puree; Solid  Oral Impairment Domain: Oral Impairment Domain Lip Closure: No labial escape Tongue control during bolus hold: Not tested Bolus preparation/mastication: Slow prolonged chewing/mashing with complete recollection Bolus transport/lingual motion: Brisk tongue motion Oral residue: Complete oral [name] Location of oral residue : N/A Initiation of pharyngeal swallow : Pyriform sinuses  Pharyngeal Impairment Domain: Pharyngeal Impairment Domain Soft palate elevation: No bolus between soft palate (SP)/pharyngeal wall (PW) Laryngeal elevation: Complete superior movement of thyroid  cartilage with complete approximation of arytenoids to epiglottic petiole Anterior hyoid excursion: Complete anterior movement Epiglottic movement: Complete inversion Laryngeal vestibule closure: Complete, no air/contrast in laryngeal vestibule Tongue base retraction: No contrast between tongue base and posterior pharyngeal wall (PPW) Pharyngeal residue: Complete pharyngeal [name] Location of pharyngeal residue: N/A  Esophageal Impairment Domain: Esophageal Impairment Domain Esophageal [name] upright position: Complete [name], esophageal coating Pill: Pill Consistency administered: Thin liquids (Level 0) Penetration/Aspiration Scale Score: Penetration/Aspiration Scale Score 1.  Material does not enter airway: Thin liquids (Level 0);  Puree; Solid; Pill Compensatory Strategies: Compensatory Strategies Compensatory strategies: No   General Information: Caregiver present: No  Diet Prior to this Study: Regular; Thin liquids (Level 0)   Temperature : Normal   No data recorded  Supplemental O2: None (Room air)   History of Recent Intubation: No  Behavior/Cognition: Alert; Cooperative Self-Feeding Abilities: Able to self-feed Baseline vocal quality/speech: Normal Volitional Cough: Able to elicit Volitional Swallow: Unable to elicit No data recorded Goal Planning: No data recorded No data recorded No data recorded No data recorded No data recorded Pain: No data recorded End of Session: Start Time:SLP Start Time (ACUTE ONLY): 0935 Stop Time: SLP Stop Time (ACUTE ONLY): 1000 Time Calculation:SLP Time Calculation (min) (ACUTE ONLY): 25 min Charges: SLP Evaluations $ SLP Speech Visit: 1 Visit SLP Evaluations $MBS Swallow: 1 Procedure SLP visit diagnosis: SLP Visit Diagnosis: Dysphagia, unspecified (R13.10) Past Medical History: Past Medical History: Diagnosis Date  Acute Bil  CVAs of Bil Frontal amd Rt Parietal  in Watershed Distribution 04/21/2021  Patchy acute cortically-based infarcts within the bilateral high  frontal lobes and right parietal lobe (predominantly in a watershed  distribution).   Subcentimeter acute infarct within the callosal splenium on the  left.    Acute bilateral cerebral infarction in a watershed distribution [facility]) 04/21/2021  a.) MRI brain 04/21/2021 --> patchy ACUTE cortically-based infarcts within the bilateral high frontal lobes and right parietal lobe  Acute cerebral infarction (HCC) 02/13/2021  a.) MRI brain 02/13/2021: acute LEFT hippocampal infarct  Acute cerebral infarction (HCC) 04/21/2021  a.) MRI brain 04/21/2021: subcentimeter ACUTE infarct within the callosal splenium on the LEFT  Acute osteomyelitis of toe of right foot with gangrene (HCC) 02/26/2022  Anaphylactic reaction due to adverse effect of correct drug or  medicament properly administered, initial encounter 05/08/2019  Anemia of chronic renal failure   Anxiety   Aortic dilatation (HCC) 11/25/2020  a.) TTE 11/25/2020: Ao root measured 38 mm. b.) TTE 04/22/2021: Ao root measured 44 mm; c. 11/2021 Echo: Ao root 40mm.  Atrial fibrillation (HCC)   a.) CHA2DS2-VASc = 6 (CHF, HTN, CVA x2, prior MI, T2DM). b.) rate/rhythm maintained on oral amiodarone + carvedilol; chronic OAC/AP therapy discontinued following ICH  Benign prostatic hyperplasia with lower urinary tract symptoms 11/07/2019  BPH (benign prostatic hyperplasia)   Cardiac arrest (HCC) 07/26/2021  a.) in setting of hyperkalemia, NSTEMI, and seizure following missed HD; pulseless and apneic --> required 1 round of CPR prior to ROSC  Cellulitis   Cerebral hemorrhage (HCC) 11/21/2021  a.) CT head 11/21/2021 --> 1.4 x 1.5 x 3.1 cm ACUTE hemorrhage within the inferior aspect of the fourth ventricle, and extending inferiorly through the foramen of foramen of [place] --> occurred in setting of HTN emergency and DOAC/APT-->eliquis/plavix d/c'd.  Cerebral microvascular disease   Chronic diarrhea   Chronic heart failure with preserved ejection fraction (HFpEF) (HCC) 07/19/2017  a.) 06/2017 Echo: EF 75%; b.) 11/2020 Echo: EF 55-60%; c.) 04/2021 Echo: EF 55-60%; d.)  11/2021 Echo: EF 65-70%, GrII DD, nl RV fxn, sev dil LA, large circumferential pericardial eff w/o tamponade, triv MR, AoV sclerosis. Ao root 40mm; e.) TTE 02/28/2025: EF 60-65%, sev LVH, mod posterior LV effusion, AoV sclerosis  Chronic pain of both ankles   Coronary artery disease 02/26/2020  a. 02/2020 Ant STEMI/PCI: LM nl, LAD 12m (3.5x30 Resolute Onyx), RI nl, LCX min irregs, RCA min irregs. EF 65%.  DDD (degenerative disc disease), cervical   Diabetes mellitus, type II (HCC)   Diabetic neuropathy (HCC)   Esophagitis   ESRD (end stage renal disease) on dialysis (HCC)   a.) [facility]; T-Th-Sat  Frequent falls   Gait instability   Gastritis   Gastroparesis   GERD  (gastroesophageal reflux disease)   H/O enucleation of left eyeball   Heart palpitations   History of kidney stones   HLD (hyperlipidemia)   Hypertension   Insomnia   a.) uses melatonin PRN  Long term current use of amiodarone   Nausea and vomiting in adult   recurrent  NSTEMI (non-ST elevated myocardial infarction) (HCC) 07/26/2021  a.) hyperkalemic from missed HD; seizures; decompensated to cardiac arrest and required CPR x 1 round prior to ROSC.  NVG (neovascular glaucoma), left, 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  Formatting of this note might be different from the original.  Added automatically from request for surgery [id number]    Osteomyelitis [facility])   Pericardial effusion   a. 11/2021 Echo: EF 65-70%, GrII DD, nl RV fxn, sev dil LA, large circumferential pericardial eff w/o tamponade, triv MR, AoV sclerosis. Ao root 40mm.  Seizure (HCC)   a.) last 07/26/2021 in setting of missed HD --> hyperkalemic at 6.5 --> pulseless/apneic and required CPR; discharged home on levetiracetam.  ST elevation myocardial infarction (STEMI) of anterior wall (HCC) 02/26/2020  a.) LHC/PCI 02/26/2020 --> EF 65%; LVEDP 11 mmHg; 90% mLAD (3.5 x 20 mm Resolute Onyx DES x 1) Past Surgical History: Past Surgical History: Procedure Laterality Date  A/V FISTULAGRAM Left 08/31/2021  Procedure: A/V Fistulagram;  Surgeon: Annice Needy, MD;  Location: [name] INVASIVE CV LAB;  Service: Cardiovascular;  Laterality: Left;  A/V FISTULAGRAM Left 01/25/2022  Procedure: A/V Fistulagram;  Surgeon: Annice Needy, MD;  Location: [name] INVASIVE CV LAB;  Service: Cardiovascular;  Laterality: Left;  A/V FISTULAGRAM Right 10/18/2022  Procedure: A/V Fistulagram;  Surgeon: Annice Needy, MD;  Location: [name] INVASIVE CV LAB;  Service: Cardiovascular;  Laterality: Right;  ABDOMINAL AORTOGRAM W/LOWER EXTREMITY Bilateral 02/08/2023  Procedure: ABDOMINAL AORTOGRAM W/LOWER EXTREMITY;  Surgeon: Daria Pastures, MD;  Location: [facility] INVASIVE CV LAB;  Service: Vascular;  Laterality:  Bilateral;  AMPUTATION Left 03/16/2016  Procedure: AMPUTATION DIGIT LEFT HALLUX;  Surgeon: Vivi Barrack, DPM;  Location: [facility] OR;  Service: Podiatry;  Laterality: Left;  can start around 5   AMPUTATION Right 02/09/2021  Procedure: AMPUTATION DIGIT;  Surgeon: Marlyne Beards, MD;  Location: [facility] OR;  Service: Orthopedics;  Laterality: Right;  AMPUTATION Right 02/04/2023  Procedure: AMPUTATION 4TH AND 5TH METATARSAL;  Surgeon: Pilar Plate, DPM;  Location: [facility] OR;  Service: Orthopedics/Podiatry;  Laterality: Right;  I&D, partial ray resection, abx beads  AMPUTATION Right 02/09/2023  Procedure: RIGHT BELOW KNEE AMPUTATION;  Surgeon: [name], MD;  Location: [facility] OR;  Service: Orthopedics;  Laterality: Right;  AMPUTATION Right 04/06/2023  Procedure: RIGHT ABOVE KNEE AMPUTATION;  Surgeon: [name], MD;  Location: [facility] OR;  Service: Orthopedics;  Laterality: Right;  AMPUTATION TOE Right 02/28/2022  Procedure: AMPUTATION TOE;  Surgeon: Louann Sjogren, DPM;  Location: [name] ORS;  Service: Podiatry;  Laterality: Right;  right fifth toe  ARTHROSCOPIC REPAIR ACL Left   AV FISTULA PLACEMENT Right 05/31/2019  Procedure: Brachiocephalic AV fistula creation;  Surgeon: Annice Needy, MD;  Location: [name] ORS;  Service: Vascular;  Laterality: Right;  AV FISTULA PLACEMENT Left 08/13/2021  Procedure: ARTERIOVENOUS (AV) FISTULA CREATION (BRACHIALCEPHALIC);  Surgeon: Annice Needy, MD;  Location: [name] ORS;  Service: Vascular;  Laterality: Left;  COLONOSCOPY WITH PROPOFOL N/A 10/28/2015  Procedure: COLONOSCOPY WITH PROPOFOL;  Surgeon: Christena Deem, MD;  Location: [facility] ENDOSCOPY;  Service: Endoscopy;  Laterality: N/A;  COLONOSCOPY WITH PROPOFOL N/A 10/29/2015  Procedure: COLONOSCOPY WITH PROPOFOL;  Surgeon: Christena Deem, MD;  Location: [facility] ENDOSCOPY;  Service: Endoscopy;  Laterality: N/A;  COLONOSCOPY WITH PROPOFOL N/A 01/27/2023  Procedure: COLONOSCOPY WITH PROPOFOL;  Surgeon: Franky Macho, MD;  Location: [facility]  ENDO SUITE;  Service: Endoscopy;  Laterality: N/A;  12:30pm, asa 3  CORONARY ULTRASOUND/IVUS N/A 02/26/2020  Procedure: Intravascular Ultrasound/IVUS;  Surgeon: Corky Crafts, MD;  Location: [facility] INVASIVE CV LAB;  Service: Cardiovascular;  Laterality: N/A;  CORONARY/GRAFT ACUTE MI REVASCULARIZATION N/A 02/26/2020  Procedure: Coronary/Graft Acute MI Revascularization;  Surgeon: Corky Crafts, MD;  Location: [facility] INVASIVE CV LAB;  Service: Cardiovascular;  Laterality: N/A;  DIALYSIS/PERMA CATHETER INSERTION Right 04/26/2019  Perm Cath   DIALYSIS/PERMA CATHETER INSERTION N/A 04/26/2019  Procedure: DIALYSIS/PERMA CATHETER INSERTION;  Surgeon: Annice Needy, MD;  Location: [name] INVASIVE CV LAB;  Service: Cardiovascular;  Laterality: N/A;  DIALYSIS/PERMA CATHETER INSERTION N/A 05/25/2021  Procedure: DIALYSIS/PERMA CATHETER INSERTION;  Surgeon: Annice Needy, MD;  Location: [name] INVASIVE CV LAB;  Service: Cardiovascular;  Laterality: N/A;  DIALYSIS/PERMA CATHETER INSERTION N/A 08/31/2021  Procedure: DIALYSIS/PERMA CATHETER INSERTION;  Surgeon: Annice Needy, MD;  Location: [name] INVASIVE CV LAB;  Service: Cardiovascular;  Laterality: N/A;  DIALYSIS/PERMA CATHETER REMOVAL N/A 08/15/2019  Procedure: DIALYSIS/PERMA CATHETER REMOVAL;  Surgeon: Renford Dills, MD;  Location: [name] INVASIVE CV LAB;  Service: Cardiovascular;  Laterality: N/A;  DIALYSIS/PERMA CATHETER REMOVAL N/A 03/15/2022  Procedure: DIALYSIS/PERMA CATHETER REMOVAL;  Surgeon: Annice Needy, MD;  Location: [name] INVASIVE CV LAB;  Service: Cardiovascular;  Laterality: N/A;  EMBOLIZATION (CATH LAB) Right 02/06/2021  Procedure: EMBOLIZATION;  Surgeon: Annice Needy, MD;  Location: [name] INVASIVE CV LAB;  Service: Cardiovascular;  Laterality: Right;  Right Upper Extremity Dialysis Access, Permcath Placement  ESOPHAGOGASTRODUODENOSCOPY (EGD) WITH PROPOFOL N/A 12/27/2017  Procedure: ESOPHAGOGASTRODUODENOSCOPY (EGD) WITH PROPOFOL;  Surgeon: Toledo, Boykin Nearing, MD;   Location: [name] ENDOSCOPY;  Service: Gastroenterology;  Laterality: N/A;  EYE SURGERY    IR THORACENTESIS ASP PLEURAL SPACE W/IMG GUIDE  05/05/2023  LEFT HEART CATH AND CORONARY ANGIOGRAPHY N/A 02/26/2020  Procedure: LEFT HEART CATH AND CORONARY ANGIOGRAPHY;  Surgeon: Corky Crafts, MD;  Location: [facility] INVASIVE CV LAB;  Service: Cardiovascular;  Laterality: N/A;  PROSTATE SURGERY  2016  REVISON OF ARTERIOVENOUS FISTULA Right 07/22/2022  Procedure: RESECTION OF ANEURYSMAL RIGHT ARM ARTERIOVENOUS FISTULA;  Surgeon: Annice Needy, MD;  Location: [name] ORS;  Service: Vascular;  Laterality: Right;  TONSILECTOMY/ADENOIDECTOMY WITH MYRINGOTOMY    TONSILLECTOMY    UPPER EXTREMITY ANGIOGRAPHY Right 11/26/2020  Procedure: Upper Extremity Angiography;  Surgeon: Annice Needy, MD;  Location: [name] INVASIVE CV LAB;  Service: Cardiovascular;  Laterality: Right;  UPPER EXTREMITY ANGIOGRAPHY Right 02/05/2021  Procedure: UPPER EXTREMITY ANGIOGRAPHY;  Surgeon: Annice Needy, MD;  Location: [name] INVASIVE CV LAB;  Service: Cardiovascular;  Laterality:  Right; [place], Riley Nearing 05/09/2023, 10:18 AM  DG Chest Port 1 View Result Date: 05/08/2023 CLINICAL DATA:  Short of breath. EXAM: PORTABLE CHEST 1 VIEW COMPARISON:  05/07/2023 and older studies. FINDINGS: Cardiac silhouette is mildly enlarged. Pigtail chest tube projects at the right lateral lung base, unchanged. There is hazy opacity adjacent to the chest tube consistent with atelectasis. Relative volume loss noted on the right. Remainder of the right lung is clear. Mild streaky atelectasis at the medial left lung base. Remainder of the left lung is clear. Small right pleural effusion. No pneumothorax. Overall, no significant change from the previous day's study allowing for differences in patient positioning and technique. IMPRESSION: 1. No significant change from the previous day's study. 2. Right chest tube in place. No pneumothorax. 3. Small right pleural effusion and adjacent  atelectasis. Electronically Signed   By: Amie Portland M.D.   On: 05/08/2023 09:23   DG Chest Port 1 View Result Date: 05/07/2023 CLINICAL DATA:  Evaluate chest tube placement EXAM: PORTABLE CHEST 1 VIEW COMPARISON:  05/06/2023 FINDINGS: Right basilar chest tube is stable in position from previous exam. No pneumothorax identified. Stable cardiomediastinal contours. Small residual right pleural effusion appears stable to slightly increased in the interval. Left lung clear. IMPRESSION: 1. Stable position of right basilar chest tube. No pneumothorax. 2. Small residual right pleural effusion appears stable to slightly increased in the interval. Electronically Signed   By: Signa Kell M.D.   On: 05/07/2023 08:46   DG Chest Port 1 View Result Date: 05/06/2023 CLINICAL DATA:  Right chest tube, history of pleural effusion EXAM: PORTABLE CHEST 1 VIEW COMPARISON:  05/05/2023 FINDINGS: Single frontal view of the chest demonstrates near complete resolution of the right pleural effusion after placement of a right-sided pigtail drainage catheter, coiled over the right lung base. There is minimal residual consolidation at the right lung base, which may reflect atelectasis or pneumonia. Left chest is clear. No pneumothorax. Cardiac silhouette is enlarged but stable. No acute bony abnormalities. IMPRESSION: 1. Near complete resolution of right pleural effusion after pigtail drainage catheter placement. 2. Minimal residual consolidation at the right lung base which may reflect atelectasis or pneumonia. 3. Stable enlarged cardiac silhouette. Electronically Signed   By: Sharlet Salina M.D.   On: 05/06/2023 16:24   CT CHEST WO CONTRAST Result Date: 05/05/2023 CLINICAL DATA:  Pneumonia, complication  suspected EXAM: CT CHEST WITHOUT CONTRAST TECHNIQUE: Multidetector CT imaging of the chest was performed following the standard protocol without IV contrast. RADIATION DOSE REDUCTION: This exam was performed according to the  departmental dose-optimization program which includes automated exposure control, adjustment of the mA and/or kV according to patient size and/or use of iterative reconstruction technique. COMPARISON:  11/21/2021 FINDINGS: Cardiovascular: Mild four-chamber cardiac enlargement. Small pericardial effusion, decreased from previous. Heavy mitral annulus calcifications. Extensive 3 vessel coronary calcifications. Aortic Atherosclerosis (ICD10-170.0). Mediastinum/Nodes: No definite mass or adenopathy. Lungs/Pleura: Moderately large right pleural effusion has developed. Extensive consolidation/atelectasis in the right lower lobe, and posterior right upper lobe. No pneumothorax. Minimal dependent atelectasis posteriorly at the left lung base. Upper Abdomen: Extensive vascular calcifications. No acute findings. Musculoskeletal: No chest wall mass or suspicious bone lesions identified. IMPRESSION: 1. Moderately large right pleural effusion with extensive right lower lobe and posterior right upper lobe consolidation/atelectasis. 2. Small pericardial effusion, decreased from previous. 3. Coronary and aortic Atherosclerosis (ICD10-I70.0). Electronically Signed   By: Corlis Leak M.D.   On: 05/05/2023 21:39   DG Chest 1 View Result Date: 05/05/2023  CLINICAL DATA:  Status post thoracentesis EXAM: CHEST  1 VIEW COMPARISON:  Chest x-ray 05/05/2023 FINDINGS: A very large right pleural effusion persists and has not significantly changed. The left lung is clear. There is no evidence for pneumothorax. The heart is enlarged, unchanged. No acute fractures are seen. IMPRESSION: Stable large right pleural effusion. No evidence for pneumothorax. Electronically Signed   By: Darliss Cheney M.D.   On: 05/05/2023 17:39   IR THORACENTESIS ASP PLEURAL SPACE W/IMG GUIDE Result Date: 05/05/2023 INDICATION: 53 year old male with history of ESRD presents with shortness of breath. Previous imaging showed right pleural effusion. Request for  therapeutic and diagnostic thoracentesis. EXAM: ULTRASOUND GUIDED RIGHT THORACENTESIS MEDICATIONS: Ancef 3 gm IV COMPLICATIONS: None immediate. PROCEDURE: An ultrasound guided thoracentesis was thoroughly discussed with the patient and questions answered. The benefits, risks, alternatives and complications were also discussed. The patient understands and wishes to proceed with the procedure. Written consent was obtained. Ultrasound was performed to localize and mark an adequate pocket of fluid in the right chest. The area was then prepped and draped in the normal sterile fashion. 1% Lidocaine was used for local anesthesia. Under ultrasound guidance a 6 Fr Safe-T-Centesis catheter was introduced. Thoracentesis was performed. The catheter was removed and a dressing applied. FINDINGS: A total of approximately 1.5 L of hazy amber fluid was removed. Samples were sent to the laboratory as requested by the clinical team. Post-procedure chest x-ray ordered, results pending. IMPRESSION: Successful ultrasound guided right thoracentesis yielding 1.5 L of pleural fluid. Performed by: Lawernce Ion, PA-C Electronically Signed   By: Gilmer Mor D.O.   On: 05/05/2023 16:00   DG Knee 1-2 Views Right Result Date: 05/05/2023 CLINICAL DATA:  [id number] Pain [id number]. Soreness in the region of the amputation. EXAM: RIGHT KNEE - 1-2 VIEW COMPARISON:  None Available. FINDINGS: Status post right above-knee amputation. The surgical margin is sharp. No focal bony erosions. There is thinning of the overlying stump especially on the cross-table view with probable small area of focal ulcer. Correlate with physical examination. No evidence of air within the stump. No acute fracture. No radiopaque foreign bodies. IMPRESSION: *Status post right above-knee amputation. No focal bony erosions. Thinning of the overlying stump with probable small area of focal ulcer. Correlate with physical examination. Electronically Signed   By: Jules Schick M.D.   On:  05/05/2023 10:36   DG Chest Portable 1 View Result Date: 05/05/2023 CLINICAL DATA:  Shortness of breath. EXAM: PORTABLE CHEST 1 VIEW COMPARISON:  10/08/2022. FINDINGS: There is large right pleural effusion causing shift of mediastinal structures to the left. Mild aeration of the right upper lung zone noted. Left lung and left lateral costophrenic angle are clear. Evaluation of cardiomediastinal silhouette is nondiagnostic due to right lower hemithorax opacification. No acute osseous abnormalities. The soft tissues are within normal limits. IMPRESSION: *Large right pleural effusion causing shift of mediastinal structures to the left. Electronically Signed   By: Jules Schick M.D.   On: 05/05/2023 08:14   Micro Results  Recent Results (from the past 240 hours)  Resp panel by RT-PCR (RSV, Flu A&B, Covid) Anterior Nasal Swab     Status: None   Collection Time: 05/22/23 12:16 PM   Specimen: Anterior Nasal Swab  Result Value Ref Range Status   SARS Coronavirus 2 by RT PCR NEGATIVE NEGATIVE Final    Comment: (NOTE) SARS-CoV-2 target nucleic acids are NOT DETECTED.  The SARS-CoV-2 RNA is generally detectable in upper respiratory specimens during the acute phase of  infection. The lowest concentration of SARS-CoV-2 viral copies this assay can detect is 138 copies/mL. A negative result does not preclude SARS-Cov-2 infection and should not be used as the sole basis for treatment or other patient management decisions. A negative result may occur with  improper specimen collection/handling, submission of specimen other than nasopharyngeal swab, presence of viral mutation(s) within the areas targeted by this assay, and inadequate number of viral copies(<138 copies/mL). A negative result must be combined with clinical observations, patient history, and epidemiological information. The expected result is Negative.  Fact Sheet for Patients:  BloggerCourse.com  Fact Sheet for  Healthcare Providers:  SeriousBroker.it  This test is no t yet approved or cleared by the [place] FDA and  has been authorized for detection and/or diagnosis of SARS-CoV-2 by FDA under an Emergency Use Authorization (EUA). This EUA will remain  in effect (meaning this test can be used) for the duration of the COVID-19 declaration under Section 564(b)(1) of the Act, 21 U.S.C.section 360bbb-3(b)(1), unless the authorization is terminated  or revoked sooner.       Influenza A by PCR NEGATIVE NEGATIVE Final   Influenza B by PCR NEGATIVE NEGATIVE Final    Comment: (NOTE) The Xpert Xpress SARS-CoV-2/FLU/RSV plus assay is intended as an aid in the diagnosis of influenza from Nasopharyngeal swab specimens and should not be used as a sole basis for treatment. Nasal washings and aspirates are unacceptable for Xpert Xpress SARS-CoV-2/FLU/RSV testing.  Fact Sheet for Patients: BloggerCourse.com  Fact Sheet for Healthcare Providers: SeriousBroker.it  This test is not yet approved or cleared by the [place] FDA and has been authorized for detection and/or diagnosis of SARS-CoV-2 by FDA under an Emergency Use Authorization (EUA). This EUA will remain in effect (meaning this test can be used) for the duration of the COVID-19 declaration under Section 564(b)(1) of the Act, 21 U.S.C. section 360bbb-3(b)(1), unless the authorization is terminated or revoked.     Resp Syncytial Virus by PCR NEGATIVE NEGATIVE Final    Comment: (NOTE) Fact Sheet for Patients: BloggerCourse.com  Fact Sheet for Healthcare Providers: SeriousBroker.it  This test is not yet approved or cleared by the [place] FDA and has been authorized for detection and/or diagnosis of SARS-CoV-2 by FDA under an Emergency Use Authorization (EUA). This EUA will remain in effect (meaning this  test can be used) for the duration of the COVID-19 declaration under Section 564(b)(1) of the Act, 21 U.S.C. section 360bbb-3(b)(1), unless the authorization is terminated or revoked.  Performed at [facility], [address], [place], [place] [address]   Culture, blood (routine x 2)     Status: None (Preliminary result)   Collection Time: 05/22/23  1:58 PM   Specimen: Right Antecubital; Blood  Result Value Ref Range Status   Specimen Description   Final    RIGHT ANTECUBITAL BOTTLES DRAWN AEROBIC AND ANAEROBIC   Special Requests Blood Culture adequate volume  Final   Culture   Final    NO GROWTH 4 DAYS Performed at [facility], [address], [place], [place] [address]    Report Status PENDING  Incomplete  Culture, blood (routine x 2)     Status: None (Preliminary result)   Collection Time: 05/22/23  1:58 PM   Specimen: BLOOD RIGHT HAND  Result Value Ref Range Status   Specimen Description   Final    BLOOD RIGHT HAND BOTTLES DRAWN AEROBIC AND ANAEROBIC   Special Requests Blood Culture adequate volume  Final   Culture  Final    NO GROWTH 4 DAYS Performed at [facility], [address] [place], [place] [address]    Report Status PENDING  Incomplete    Today   Subjective   [name] today has no new concerns - Tolerating hemodialysis well No fever  Or chills   No Nausea, Vomiting or Diarrhea       Patient has been seen and examined prior to discharge   Objective   Blood pressure (!) 165/75, pulse 60, temperature 97.8 F (36.6 C), temperature source Axillary, resp. rate 17, height 6\' 3"  (1.905 m), weight 75.3 kg, SpO2 98%.   Intake/Output Summary (Last 24 hours) at 05/26/2023 1129 Last data filed at 05/26/2023 0900 Gross per 24 hour  Intake 780 ml  Output --  Net 780 ml    Exam Gen:- Awake Alert, in no acute distress  HEENT:- [place].AT,, left eye enucleated Neck-Supple Neck,No JVD,.  Lungs-  CTAB , fair air movement bilaterally  CV- S1, S2 normal,  RRR Abd-  +ve B.Sounds, Abd Soft, No tenderness,    Psych-affect is appropriate, oriented x3 Neuro-generalized weakness, no new focal deficits, no tremors Ext: Warm, dry, LUA AVF +T/B. R AKA incision has well-apposed wound edges with sutures in placed, dried blood without significant tenderness, erythema or discharge. Left heel wound has black eschar and no exudate/odor or tenderness.    Data Review   CBC w Diff:  Lab Results  Component Value Date   WBC 11.8 (H) 05/25/2023   HGB 8.1 (L) 05/25/2023   HCT 27.0 (L) 05/25/2023   PLT 248 05/25/2023   LYMPHOPCT 10 05/22/2023   BANDSPCT 0 06/23/2015   MONOPCT 9 05/22/2023   EOSPCT 11 05/22/2023   BASOPCT 1 05/22/2023   CMP:  Lab Results  Component Value Date   NA 137 05/25/2023   K 5.1 05/25/2023   CL 98 05/25/2023   CO2 23 05/25/2023   BUN 97 (H) 05/25/2023   CREATININE 11.23 (H) 05/25/2023   PROT 6.3 (L) 05/22/2023   ALBUMIN 2.2 (L) 05/22/2023   BILITOT 0.7 05/22/2023   ALKPHOS 78 05/22/2023   AST 13 (L) 05/22/2023   ALT 8 05/22/2023   Total Discharge time is about 33 minutes  Shon Hale M.D on 05/26/2023 at 11:29 AM  Go to [email or web address] -  for contact info  [facility] - Office  [phone]

## 2023-05-26 NOTE — TOC Transition Note (Signed)
 Transition of Care [facility]) - Discharge Note   Patient Details  Name: [name]. MRN: [id number] Date of Birth: 10/09/1970  Transition of [facility]) CM/SW Contact:  Elliot Gault, LCSW Phone Number: 05/26/2023, 3:06 PM   Clinical Narrative:     Pt stable for dc to [facility] rehab per MD. Zigmund Gottron at [facility] and they can admit pt today.  Dc clinical sent electronically. RN to call report. EMS arranged.  No other TOC needs for dc.  Final next level of care: Skilled Nursing Facility Barriers to Discharge: Barriers Resolved   Patient Goals and CMS Choice Patient states their goals for this hospitalization and ongoing recovery are:: SNF   Choice offered to / list presented to : Spouse Young ownership interest in [facility].provided to:: Patient    Discharge Placement                       Discharge Plan and Services Additional resources added to the After Visit Summary for   In-house Referral: Clinical Social Work   Post Acute Care Choice: Skilled Nursing Facility                               Social Drivers of Health (SDOH) Interventions SDOH Screenings   Food Insecurity: No Food Insecurity (05/22/2023)  Housing: High Risk (05/22/2023)  Transportation Needs: No Transportation Needs (05/22/2023)  Utilities: Not At Risk (05/22/2023)  Financial Resource Strain: High Risk (07/05/2017)  Physical Activity: Inactive (07/05/2017)  Social Connections: Socially Integrated (05/05/2023)  Stress: Stress Concern Present (07/05/2017)  Tobacco Use: Low Risk  (05/22/2023)  Health Literacy: Low Risk  (06/01/2020)   Received from [facility], [facility]     Readmission Risk Interventions    05/17/2023    3:44 PM 07/27/2021   11:49 AM 11/27/2020    3:06 PM  Readmission Risk Prevention Plan  Transportation Screening Complete Complete Complete  PCP or Specialist Appt within 3-5 Days   Complete  HRI or Home Care Consult   Complete  Social Work  Consult for Recovery Care Planning/Counseling   Complete  Palliative Care Screening   Not Applicable  Medication Review Ocean[facility]) Referral to Pharmacy Complete Complete  PCP or Specialist appointment within 3-5 days of discharge Complete    HRI or Home Care Consult Complete    SW Recovery Care/Counseling Consult Complete Complete   Palliative Care Screening Not Applicable Not Applicable   Skilled Nursing Facility Not Applicable Not Applicable

## 2023-05-26 NOTE — Progress Notes (Signed)
 At 0320 patient c/o feeling his heart beat irregular. EKG obtained and Dr Thomes Dinning recommended to continue to monitory patient. Kellogg RN

## 2023-05-26 NOTE — Progress Notes (Signed)
 [facility] NEPHROLOGY PROGRESS NOTE  Assessment/ Plan: Pt is a 53 y.o. yo male   Dialysis Orders: Center: [facility]  TTS. 3 hours 15 minutes. EDW: 77.5 kg (however likely well under this as per outpatient unit). LUE AVF, 15-gauge needles. Flow rates: 400/800. 37 degrees. 2K/2.5 calcium. UF profile #2. Heparin: Loading 1000 units, 400 units/h. Meds: Mircera 100 mcg every 2 weeks (last dose 3/4)   #Generalized weakness - ongoing issue.  Was not able to find SNF placement.  Plan per primary svc. # ESRD -  HD today.  Follow-up TTS schedule.  # Hyperkalemia - due to noncompliance with HD.  Will continue with Lokelma 10 grams daily   # Hypertension/volume  - stable # ABLA on chronic Anemia  - continue with ESA and transfuse for Hgb <7.  He required blood transfusion. # Metabolic bone disease -   continue with home meds # Nutrition -  renal diet, carb modified #DM type 2 - per primary svc #CAD - stable #S/p revision of right AKA - per primary #HFpEF- appears euvolemic #Seizure disorder - continue with Keppra.  Subjective: Seen and examined at bedside.  Denies nausea, vomiting, chest pain or shortness of breath.  Plan for dialysis today. Objective Vital signs in last 24 hours: Vitals:   05/25/23 1949 05/26/23 0320 05/26/23 0634 05/26/23 0818  BP: (!) 158/68 (!) 156/66  (!) 165/75  Pulse: 62 60 60 60  Resp: 20 17    Temp: 98.9 F (37.2 C) 97.8 F (36.6 C)    TempSrc: Oral Axillary    SpO2: 95% 96% 98%   Weight:      Height:       Weight change:   Intake/Output Summary (Last 24 hours) at 05/26/2023 0936 Last data filed at 05/26/2023 0900 Gross per 24 hour  Intake 1020 ml  Output --  Net 1020 ml       Labs: RENAL PANEL Recent Labs  Lab 05/22/23 1239 05/23/23 0446 05/24/23 0448 05/25/23 1034  NA 137 141 137 137  K 4.6 4.9 5.3* 5.1  CL 102 104 102 98  CO2 22 21* 21* 23  GLUCOSE 89 78 141* 101*  BUN 110* 114* 123* 97*  CREATININE 12.72* 13.48*  14.41* 11.23*  CALCIUM 8.5* 9.0 8.8* 8.5*  ALBUMIN 2.2*  --   --   --     Liver Function Tests: Recent Labs  Lab 05/22/23 1239  AST 13*  ALT 8  ALKPHOS 78  BILITOT 0.7  PROT 6.3*  ALBUMIN 2.2*   No results for input(s): "LIPASE", "AMYLASE" in the last 168 hours. No results for input(s): "AMMONIA" in the last 168 hours. CBC: Recent Labs    02/19/23 0259 02/21/23 0257 03/26/23 0526 03/29/23 1211 05/16/23 1229 05/22/23 1239 05/23/23 0446 05/24/23 0448 05/25/23 1034  HGB 8.0*   < > 7.8*   < > 7.3* 7.0* 7.5* 7.0* 8.1*  MCV 76.8*   < > 75.5*   < > 75.2* 76.8* 76.6* 78.3* 76.7*  VITAMINB12  --   --  501  --   --   --   --   --   --   FOLATE  --   --  6.1  --   --   --   --   --   --   FERRITIN  --   --  820*  --   --   --   --   --   --   TIBC 134*  --  141*  --   --   --   --   --   --   IRON 34*  --  43*  --   --   --   --   --   --   RETICCTPCT  --   --  1.1  --   --   --   --   --   --    < > = values in this interval not displayed.    Cardiac Enzymes: No results for input(s): "CKTOTAL", "CKMB", "CKMBINDEX", "TROPONINI" in the last 168 hours. CBG: Recent Labs  Lab 05/22/23 1834 05/22/23 2148 05/23/23 0746 05/23/23 0810  GLUCAP 72 106* 47* 249*    Iron Studies: No results for input(s): "IRON", "TIBC", "TRANSFERRIN", "FERRITIN" in the last 72 hours. Studies/Results: No results found.  Medications: Infusions:   Scheduled Medications:  (feeding supplement) PROSource Plus  30 mL Oral TID BM   amiodarone  200 mg Oral BH-q7a   aspirin EC  81 mg Oral Daily   atorvastatin  80 mg Oral QHS   calcitRIOL  2.25 mcg Oral Q T,Th,Sat-1800   calcium acetate  1,334 mg Oral TID WC   carvedilol  12.5 mg Oral BID   Chlorhexidine Gluconate Cloth  6 each Topical Q0600   cinacalcet  60 mg Oral Q T,Th,Sat-1800   darbepoetin (ARANESP) injection - DIALYSIS  150 mcg Subcutaneous Q Thu-1800   heparin  5,000 Units Subcutaneous Q8H   levETIRAcetam  500 mg Oral Once per day on  Sunday Tuesday Thursday Saturday   levETIRAcetam  750 mg Oral Once per day on Monday Wednesday Friday   multivitamin  1 tablet Oral QHS   sodium zirconium cyclosilicate  10 g Oral Daily    have reviewed scheduled and prn medications.  Physical Exam: General:NAD, comfortable Heart:RRR, s1s2 nl Lungs:clear b/l, no crackle Abdomen:soft, Non-tender, non-distended Extremities:No edema Dialysis Access: AVF used has good thrill and bruit.  Shannyn Jankowiak Prasad Jossette Zirbel 05/26/2023,9:36 AM  LOS: 0 days

## 2023-05-26 NOTE — Progress Notes (Signed)
  HEMODIALYSIS TREATMENT NOTE:  Flat affect.  Irritable.  HD started after verbal consent obtained (witnessed by Lillia Carmel, NP).  After 2.5 hours he asked to end session.  Offered no reason beyond, "I've just gotta get off of here."  Risks / benefits were explained.  "I understand all that, ma'am, but I can't stay on here."  He kept bending his access arm behind his head despite multiple warnings not to risk infiltration of AVF.  Each instruction was met with "just take me off."  Session was stopped at his insistence after 2h 60m.  [name] request to end session was witnessed by Charna Elizabeth, RN. All blood was returned.  Hemostasis was achieved in 15 minutes.  Post-HD:  05/26/23 1438  Vital Signs  Temp 98.1 F (36.7 C)  Temp Source Oral  Pulse Rate 62  Pulse Rate Source Monitor  Resp 18  BP (!) 168/72  BP Location Right Arm  BP Method Automatic  Patient Position (if appropriate) Lying  Oxygen Therapy  SpO2 99 %  O2 Device Room Air  Pain Assessment  Pain Score 0  Dialysis Weight  Weight 74.4 kg  Type of Weight Post-Dialysis  Post Treatment  Dialyzer Clearance Lightly streaked  Hemodialysis Intake (mL) 0 mL  Liters Processed 59.8  Fluid Removed (mL) 1800 mL  Tolerated HD Treatment No (Comment)  Post-Hemodialysis Comments Signed off early / AMA after 2.75 hours.  Dr. Ronalee Belts was notified  AVG/AVF Arterial Site Held (minutes) 7 minutes  AVG/AVF Venous Site Held (minutes) 7 minutes  Fistula / Graft Left Upper arm  No placement date or time found.   Orientation: Left  Access Location: Upper arm  Fistula / Graft Assessment Thrill;Bruit  Status Patent  Education / Care Plan  Dialysis Education Provided Yes  Documented Education in Care Plan Yes   Arman Filter, RN AP KDU

## 2023-05-27 LAB — CULTURE, BLOOD (ROUTINE X 2)
Culture: NO GROWTH
Culture: NO GROWTH
Special Requests: ADEQUATE
Special Requests: ADEQUATE

## 2023-05-30 ENCOUNTER — Emergency Department (HOSPITAL_COMMUNITY)
Admission: EM | Admit: 2023-05-30 | Discharge: 2023-05-30 | Disposition: A | Discharge status: Skilled Nursing Facility | Attending: Emergency Medicine | Admitting: Emergency Medicine

## 2023-05-30 ENCOUNTER — Other Ambulatory Visit: Payer: Self-pay

## 2023-05-30 ENCOUNTER — Encounter (HOSPITAL_COMMUNITY): Payer: Self-pay

## 2023-05-30 DIAGNOSIS — I509 Heart failure, unspecified: Secondary | ICD-10-CM | POA: Insufficient documentation

## 2023-05-30 DIAGNOSIS — N186 End stage renal disease: Secondary | ICD-10-CM | POA: Insufficient documentation

## 2023-05-30 DIAGNOSIS — I2510 Atherosclerotic heart disease of native coronary artery without angina pectoris: Secondary | ICD-10-CM | POA: Diagnosis not present

## 2023-05-30 DIAGNOSIS — H5461 Unqualified visual loss, right eye, normal vision left eye: Secondary | ICD-10-CM | POA: Diagnosis not present

## 2023-05-30 DIAGNOSIS — Z7982 Long term (current) use of aspirin: Secondary | ICD-10-CM | POA: Insufficient documentation

## 2023-05-30 DIAGNOSIS — H5711 Ocular pain, right eye: Secondary | ICD-10-CM | POA: Diagnosis present

## 2023-05-30 DIAGNOSIS — Z79899 Other long term (current) drug therapy: Secondary | ICD-10-CM | POA: Diagnosis not present

## 2023-05-30 DIAGNOSIS — H409 Unspecified glaucoma: Secondary | ICD-10-CM | POA: Insufficient documentation

## 2023-05-30 DIAGNOSIS — Z992 Dependence on renal dialysis: Secondary | ICD-10-CM | POA: Diagnosis not present

## 2023-05-30 LAB — CBC WITH DIFFERENTIAL/PLATELET
Abs Immature Granulocytes: 0.03 10*3/uL (ref 0.00–0.07)
Basophils Absolute: 0.1 10*3/uL (ref 0.0–0.1)
Basophils Relative: 1 %
Eosinophils Absolute: 0.9 10*3/uL — ABNORMAL HIGH (ref 0.0–0.5)
Eosinophils Relative: 9 %
HCT: 27.2 % — ABNORMAL LOW (ref 39.0–52.0)
Hemoglobin: 7.9 g/dL — ABNORMAL LOW (ref 13.0–17.0)
Immature Granulocytes: 0 %
Lymphocytes Relative: 12 %
Lymphs Abs: 1.2 10*3/uL (ref 0.7–4.0)
MCH: 23.2 pg — ABNORMAL LOW (ref 26.0–34.0)
MCHC: 29 g/dL — ABNORMAL LOW (ref 30.0–36.0)
MCV: 79.8 fL — ABNORMAL LOW (ref 80.0–100.0)
Monocytes Absolute: 1.1 10*3/uL — ABNORMAL HIGH (ref 0.1–1.0)
Monocytes Relative: 11 %
Neutro Abs: 6.6 10*3/uL (ref 1.7–7.7)
Neutrophils Relative %: 67 %
Platelets: 185 10*3/uL (ref 150–400)
RBC: 3.41 MIL/uL — ABNORMAL LOW (ref 4.22–5.81)
RDW: 24 % — ABNORMAL HIGH (ref 11.5–15.5)
WBC: 10 10*3/uL (ref 4.0–10.5)
nRBC: 0 % (ref 0.0–0.2)

## 2023-05-30 LAB — BASIC METABOLIC PANEL WITH GFR
Anion gap: 13 (ref 5–15)
BUN: 67 mg/dL — ABNORMAL HIGH (ref 6–20)
CO2: 25 mmol/L (ref 22–32)
Calcium: 9.1 mg/dL (ref 8.9–10.3)
Chloride: 102 mmol/L (ref 98–111)
Creatinine, Ser: 8.57 mg/dL — ABNORMAL HIGH (ref 0.61–1.24)
GFR, Estimated: 7 mL/min — ABNORMAL LOW (ref >60)
Glucose, Bld: 136 mg/dL — ABNORMAL HIGH (ref 70–99)
Potassium: 4.6 mmol/L (ref 3.5–5.1)
Sodium: 140 mmol/L (ref 135–145)

## 2023-05-30 MED ORDER — BRIMONIDINE TARTRATE 0.2 % OP SOLN
1.0000 [drp] | OPHTHALMIC | Status: AC
  Administered 2023-05-30: 1 [drp] via OPHTHALMIC
  Filled 2023-05-30: qty 5

## 2023-05-30 MED ORDER — BRIMONIDINE TARTRATE 0.2 % OP SOLN: 1.0000 [drp] | Freq: Three times a day (TID) | OPHTHALMIC | 12 refills | Status: DC

## 2023-05-30 MED ORDER — FLUORESCEIN SODIUM 1 MG OP STRP
1.0000 | ORAL_STRIP | Freq: Once | OPHTHALMIC | Status: AC
  Administered 2023-05-30: 1 via OPHTHALMIC
  Filled 2023-05-30: qty 1

## 2023-05-30 MED ORDER — TETRACAINE HCL 0.5 % OP SOLN
1.0000 [drp] | Freq: Once | OPHTHALMIC | Status: AC
  Administered 2023-05-30: 1 [drp] via OPHTHALMIC
  Filled 2023-05-30: qty 4

## 2023-05-30 NOTE — ED Notes (Signed)
 Just placed drops in eyes, and wrapped his leg.

## 2023-05-30 NOTE — ED Notes (Signed)
 Pt cleaned up due to bowel movement and linen and brief changed. Pt turned and repositioned to other side due to bed sores.

## 2023-05-30 NOTE — ED Triage Notes (Signed)
 Pt BIB ems from [facility] rehab for right eye blurry vision per pt and facility starting this morning. Pt has hx of glaucoma. When pt asked what time he went to bed last night "I don't know, I got to bed late." When pt asked what time he woke up pt states "I don't know." Pt asked how his vision is now he states "it's getting better."

## 2023-05-30 NOTE — ED Notes (Signed)
 Called for take home to [facility] room 410(2)

## 2023-05-30 NOTE — ED Provider Notes (Signed)
 [place] EMERGENCY DEPARTMENT AT [facility] Provider Note   CSN: [id number] Arrival date & time: 05/30/23  1435     History {Add pertinent medical, surgical, social history, OB history to HPI:1} Chief Complaint  Patient presents with   Blurred Vision    [name]. is a 53 y.o. male.  53 year old male with a history of MPG status post left eye enucleation, ESRD on IHD, CHF, and CAD who presents emergency department with painful vision loss of the right eye.  Patient reports that this morning he woke up and had some eye pain.  Says that his vision was blurry but that it appears to be improving.  Is having photophobia out of his right eye.  No foreign bodies to the eye.  No temporal pain.  Denies any jaw claudication.  No muscle soreness.  Says he has been compliant with his Cosopt recently.       Home Medications Prior to Admission medications   Medication Sig Start Date End Date Taking? Authorizing Provider  acetaminophen (TYLENOL) 325 MG tablet Take 2 tablets (650 mg total) by mouth every 6 (six) hours as needed for mild pain (pain score 1-3) or fever (or Fever >/= 101). 05/26/23   Shon Hale, MD  amiodarone (PACERONE) 200 MG tablet Take 1 tablet (200 mg total) by mouth daily. 05/26/23 05/26/24  Shon Hale, MD  ascorbic acid (VITAMIN C) 500 MG tablet Take 1,000 mg by mouth daily.    [provider]  aspirin EC 81 MG tablet Take 1 tablet (81 mg total) by mouth daily with breakfast. Swallow whole. 05/26/23   Shon Hale, MD  atorvastatin (LIPITOR) 80 MG tablet Take 1 tablet (80 mg total) by mouth at bedtime. 05/26/23   Shon Hale, MD  calcitRIOL (ROCALTROL) 0.25 MCG capsule Take 9 capsules (2.25 mcg total) by mouth every Tuesday, Thursday, and Saturday at 6 PM. 02/24/23   Danford, Earl Lites, MD  calcium acetate (PHOSLO) 667 MG capsule Take 2 capsules (1,334 mg total) by mouth 3 (three) times daily with meals. 05/04/21   Love, Evlyn Kanner, PA-C   carvedilol (COREG) 12.5 MG tablet Take 1 tablet (12.5 mg total) by mouth 2 (two) times daily. 05/26/23   Shon Hale, MD  cinacalcet (SENSIPAR) 30 MG tablet Take 2 tablets (60 mg total) by mouth every Tuesday, Thursday, and Saturday at 6 PM. 02/24/23   Danford, Earl Lites, MD  dorzolamide-timolol (COSOPT) 2-0.5 % ophthalmic solution Place 1 drop into the right eye 2 (two) times daily. 11/15/22 11/15/23  [provider]  levETIRAcetam (KEPPRA) 500 MG tablet Take 1 tablet (500 mg total) by mouth 4 (four) times a week. On Sunday, Monday, Wednesday and Friday, all non-dialysis days 05/26/23   Shon Hale, MD  levETIRAcetam (KEPPRA) 750 MG tablet Take 1 tablet (750 mg total) by mouth 3 (three) times a week. On Tuesday Thursday and Saturday, on dialysis days 05/27/23   Shon Hale, MD  lidocaine-prilocaine (EMLA) cream Apply 1 Application topically 3 (three) times a week. 12/24/21   [provider]  loperamide (IMODIUM) 2 MG capsule Take 1 capsule (2 mg total) by mouth as needed for diarrhea or loose stools. 02/23/23   Danford, Earl Lites, MD  multivitamin (RENA-VIT) TABS tablet Take 1 tablet by mouth at bedtime. 04/12/23   Rai, Delene Ruffini, MD  nutrition supplement, JUVEN, (JUVEN) PACK Take 1 packet by mouth 2 (two) times daily between meals. 02/23/23   Danford, Earl Lites, MD  Nutritional Supplements (,FEEDING  SUPPLEMENT, PROSOURCE PLUS) liquid Take 30 mLs by mouth 3 (three) times daily between meals. 02/23/23   Danford, Earl Lites, MD  ondansetron (ZOFRAN) 4 MG tablet Take 1 tablet (4 mg total) by mouth every 6 (six) hours as needed for nausea or vomiting. 03/17/23   Al Decant, PA-C  oxyCODONE (OXY IR/ROXICODONE) 5 MG immediate release tablet Take 1 tablet (5 mg total) by mouth every 4 (four) hours as needed for moderate pain (pain score 4-6). 05/26/23   Shon Hale, MD  polyethylene glycol (MIRALAX / GLYCOLAX) 17 g packet Take 17 g by mouth daily. 05/26/23   Shon Hale, MD  zinc sulfate, 50mg  elemental zinc, 220 (50 Zn) MG capsule Take 220 mg by mouth daily.    [provider]      Allergies    Promethazine    Review of Systems   Review of Systems  Physical Exam Updated Vital Signs BP (!) 125/54 (BP Location: Right Arm)   Pulse 67   Temp 98.5 F (36.9 C) (Oral)   Resp 18   Ht 6\' 3"  (1.905 m)   Wt 74.4 kg   SpO2 99%   BMI 20.50 kg/m  Physical Exam Eyes:     General: Lids are normal.     Comments: Right eye: Eye pressure 32 mmHg.  Counts fingers at 1 foot away.  EOM intact.  Fluorescein stain without any focal uptake.  Left eye: Enucleated     ED Results / Procedures / Treatments   Labs (all labs ordered are listed, but only abnormal results are displayed) Labs Reviewed  BASIC METABOLIC PANEL WITH GFR  CBC WITH DIFFERENTIAL/PLATELET  I-STAT CHEM 8, ED    EKG None  Radiology No results found.  Procedures Procedures  {Document cardiac monitor, telemetry assessment procedure when appropriate:1}  Medications Ordered in ED Medications  fluorescein ophthalmic strip 1 strip (has no administration in time range)  tetracaine (PONTOCAINE) 0.5 % ophthalmic solution 1 drop (has no administration in time range)    ED Course/ Medical Decision Making/ A&P   {   Click here for ABCD2, HEART and other calculatorsREFRESH Note before signing :1}                              Medical Decision Making Amount and/or Complexity of Data Reviewed Labs: ordered.  Risk Prescription drug management.   ***  {Document critical care time when appropriate:1} {Document review of labs and clinical decision tools ie heart score, Chads2Vasc2 etc:1}  {Document your independent review of radiology images, and any outside records:1} {Document your discussion with family members, caretakers, and with consultants:1} {Document social determinants of health affecting pt's care:1} {Document your decision making why or why not admission,  treatments were needed:1} Final Clinical Impression(s) / ED Diagnoses Final diagnoses:  None    Rx / DC Orders ED Discharge Orders     None

## 2023-05-30 NOTE — Discharge Instructions (Signed)
 You were seen for the vision loss in your right eye in the emergency department.   At home, please start taking the brimonidine that we have prescribed you.    Check your MyChart online for the results of any tests that had not resulted by the time you left the emergency department.   Call the ophthalmologist (Dr. Sherryll Burger) tomorrow about an appointment.  If you are not able to get 1 tomorrow please go to his office at 8 AM on Wednesday morning.  Thank you for visiting our Emergency Department. It was a pleasure taking care of you today.

## 2023-05-31 IMAGING — CR DG HAND COMPLETE 3+V*R*
1 series · 3 of 3 positions shown · non-contrast
Comparison: X-ray right hand 11/24/2020

CLINICAL DATA: Pt comes into the ED via POV c/o infection in his
right hand. Pt had surgery in [DATE] for gangrene in the right 4th
digit. right hand pain s/p amputation, continued

EXAM:
RIGHT HAND - COMPLETE 3+ VIEW

[Series 1: dg hand complete right · 0.14mm/px · 3 of 3 slices shown]
[im 1/3]
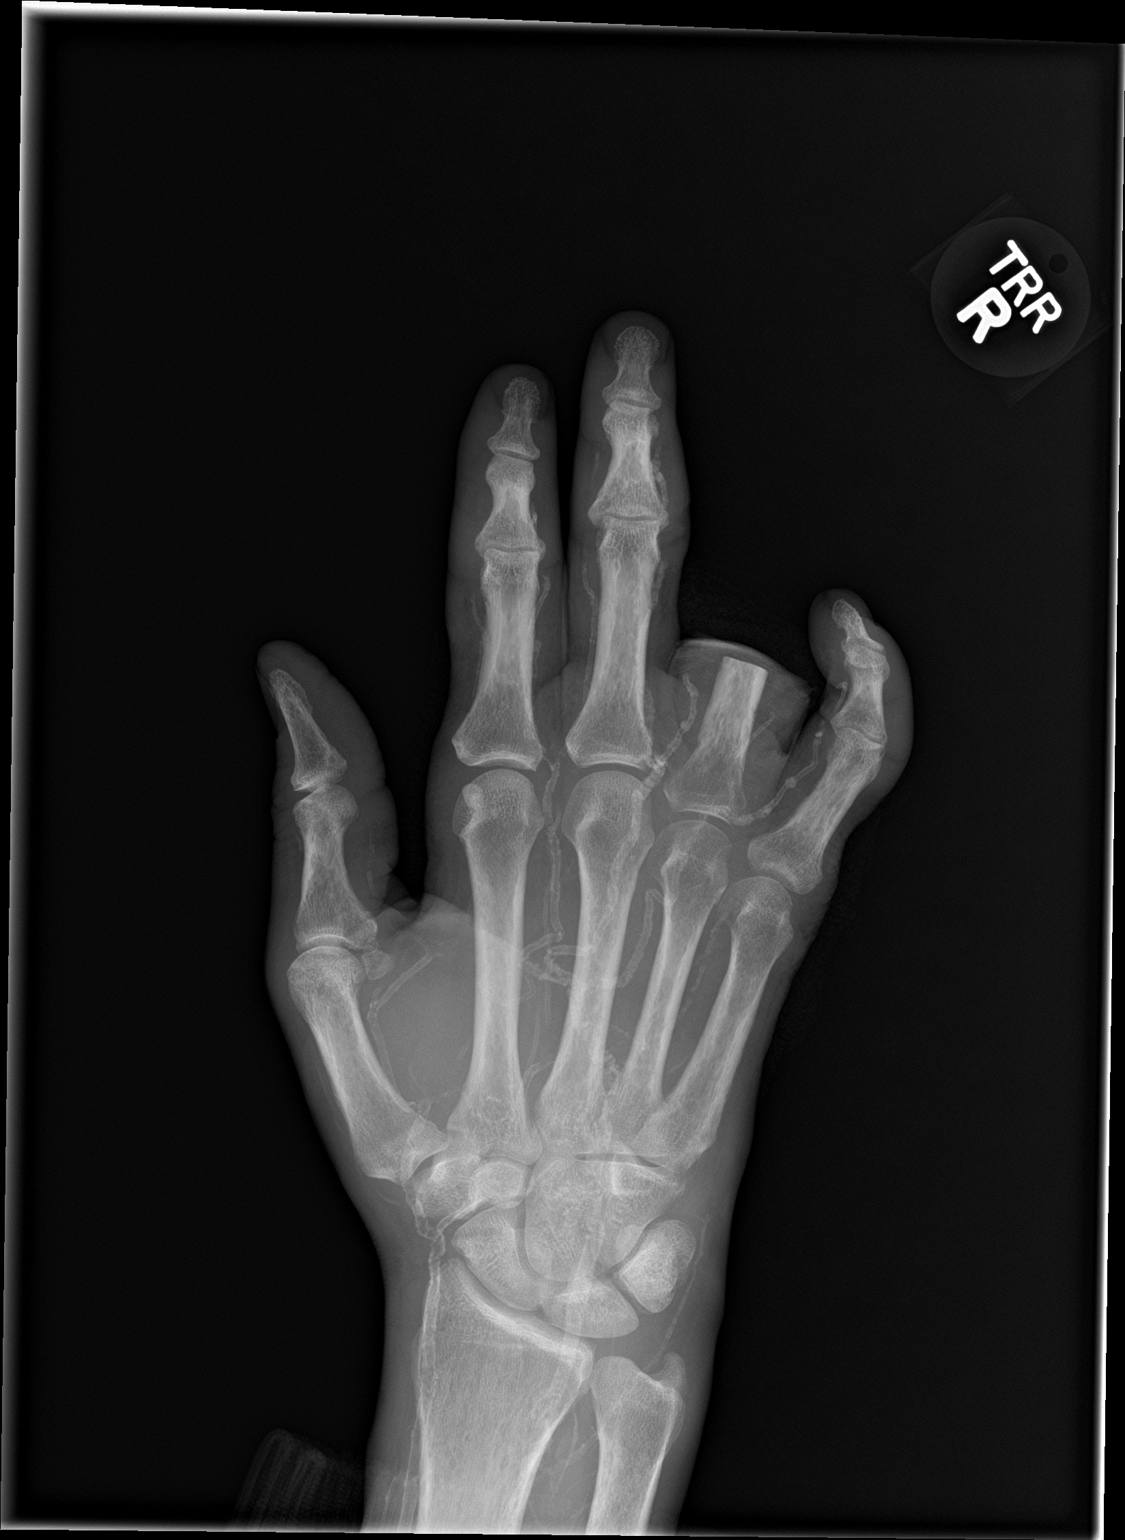
[im 2/3]
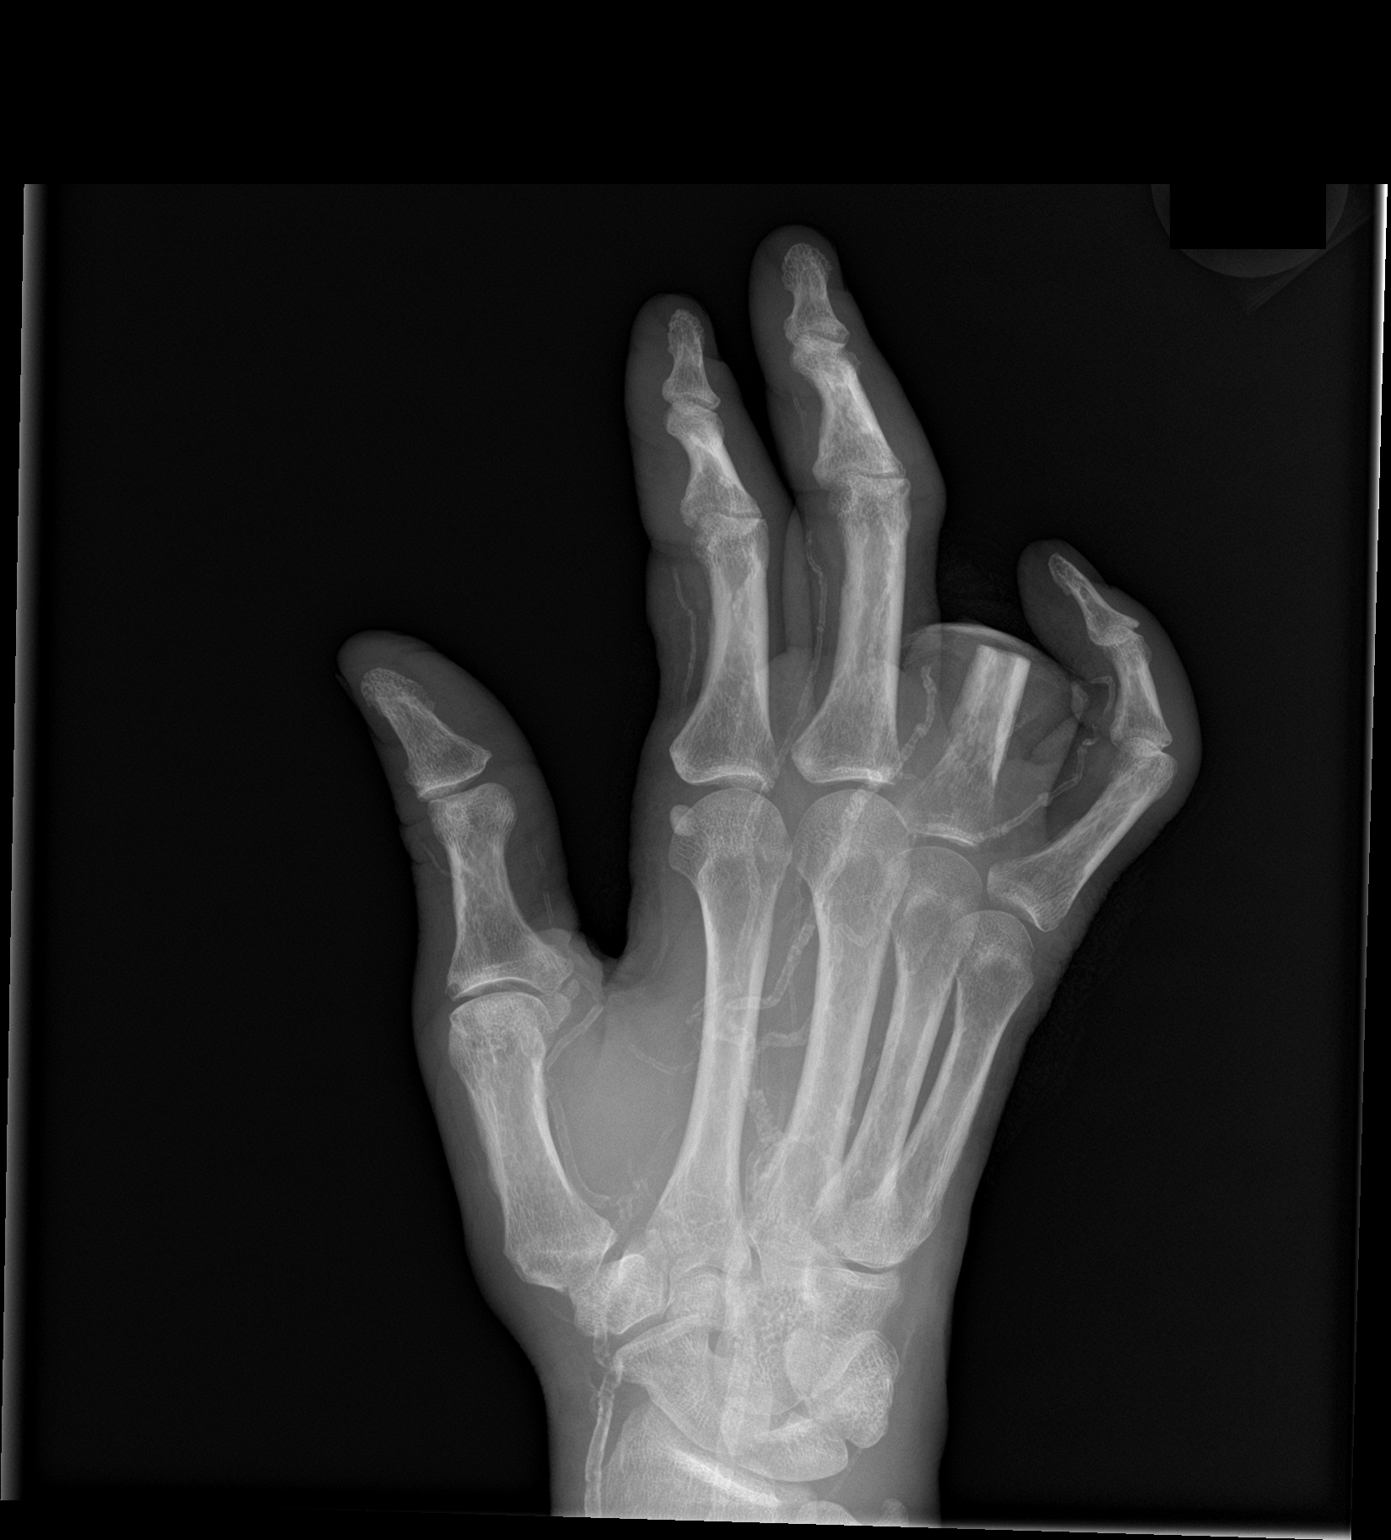
[im 3/3]
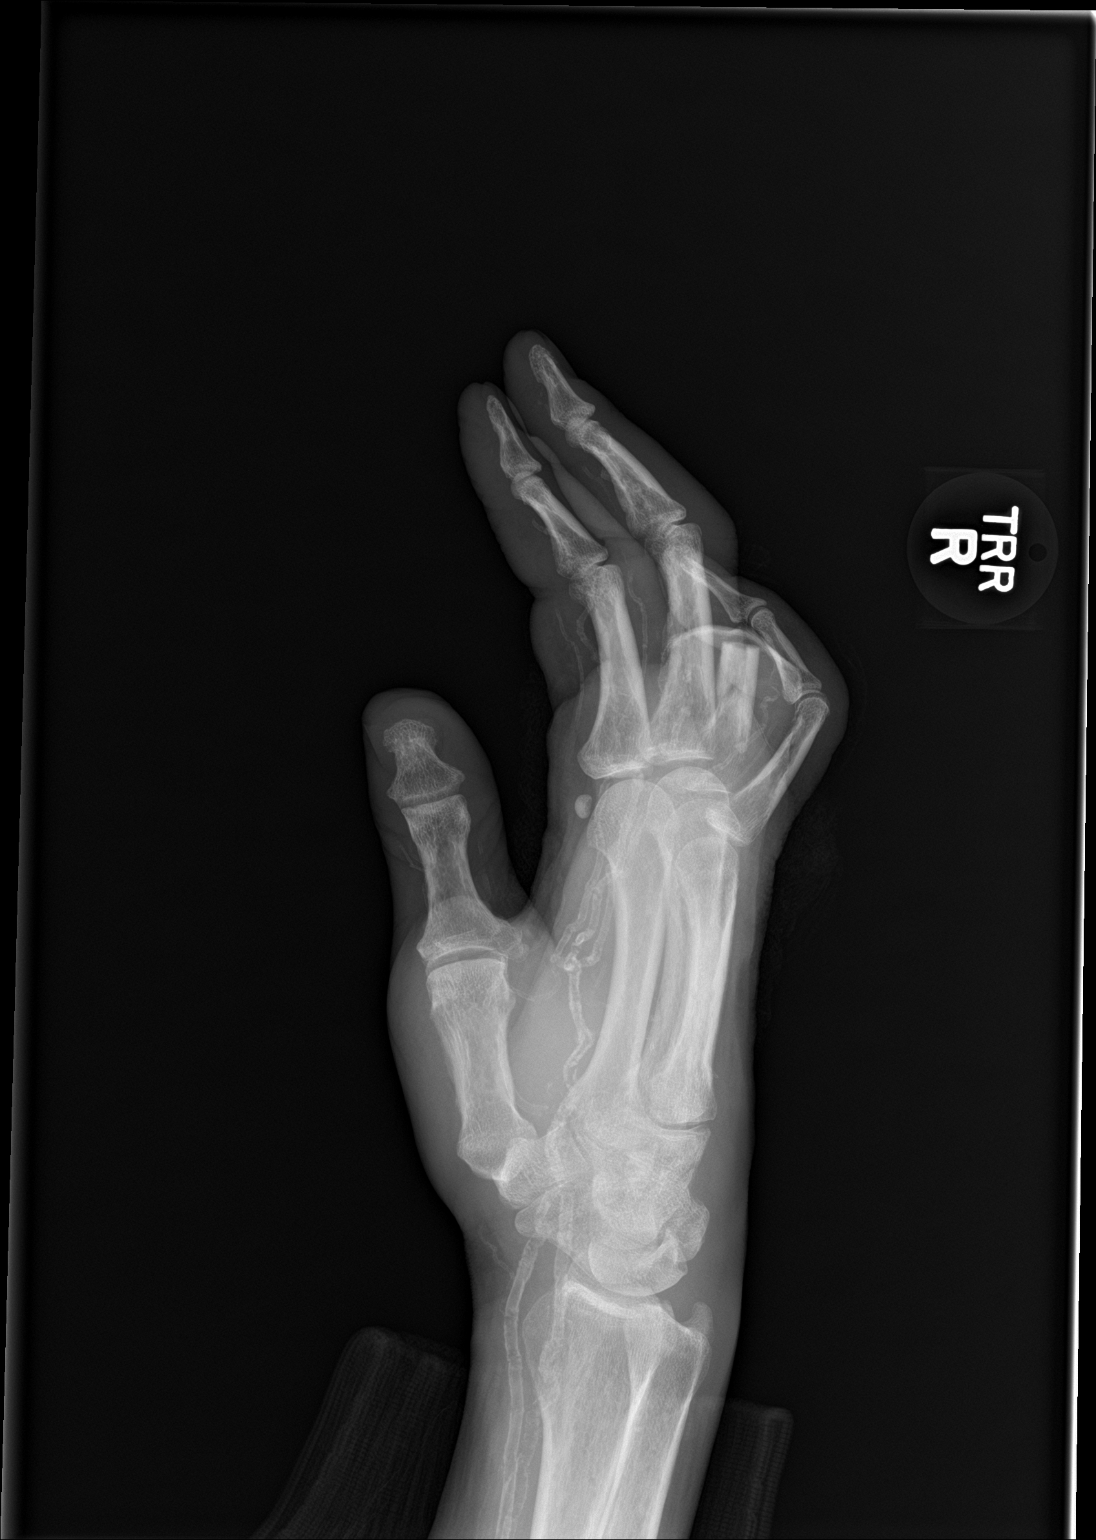

[3 of 3 positions shown; findings below may reference images not displayed]

FINDINGS: Status post amputation of the fourth digit proximal phalanx body.
Sharp margins. No cortical erosion or destruction. There is no
evidence of fracture or dislocation. There is no evidence of severe
arthropathy or aggressive appearing focal bone abnormality. Soft
tissues are unremarkable. Vascular calcifications.
IMPRESSION: Status post amputation of the fourth digit proximal phalanx body.

## 2023-06-07 IMAGING — DX DG HAND COMPLETE 3+V*R*
3 series · 3 of 3 positions shown · non-contrast
Comparison: 01/28/2021

CLINICAL DATA: Right hand wound, fever.  Surgery in Sunday November, 2020

EXAM:
RIGHT HAND - COMPLETE 3+ VIEW

[hand ap]
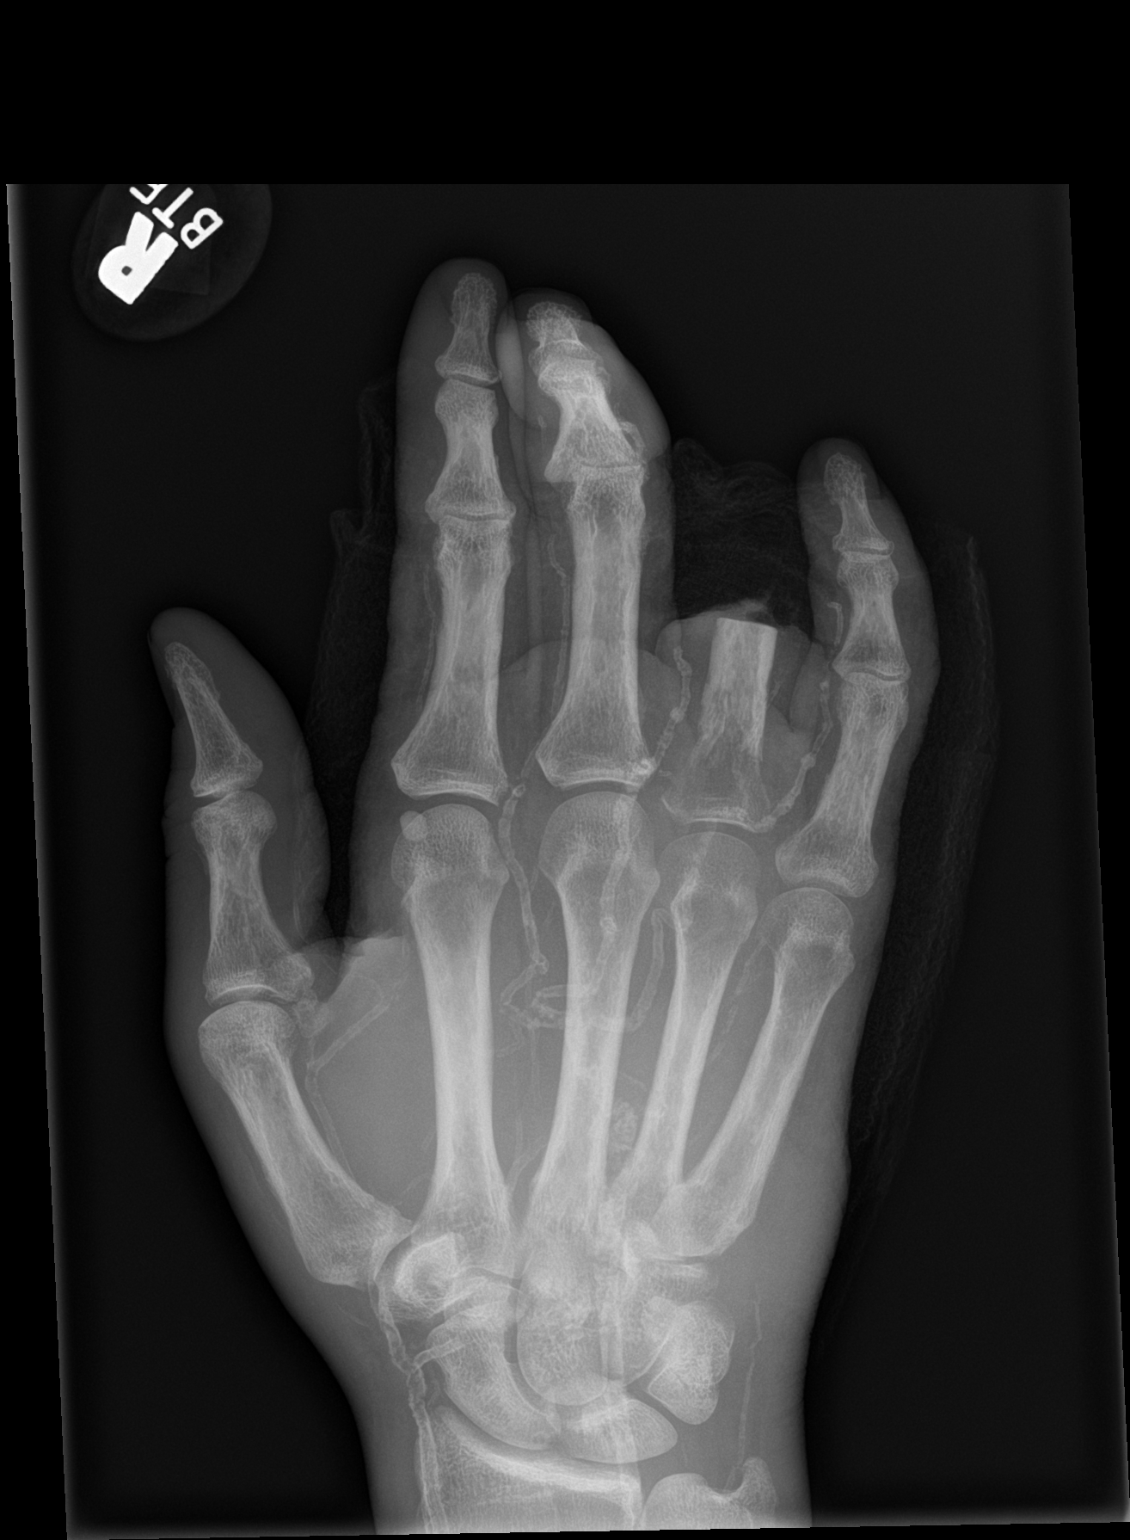

[hand obl]
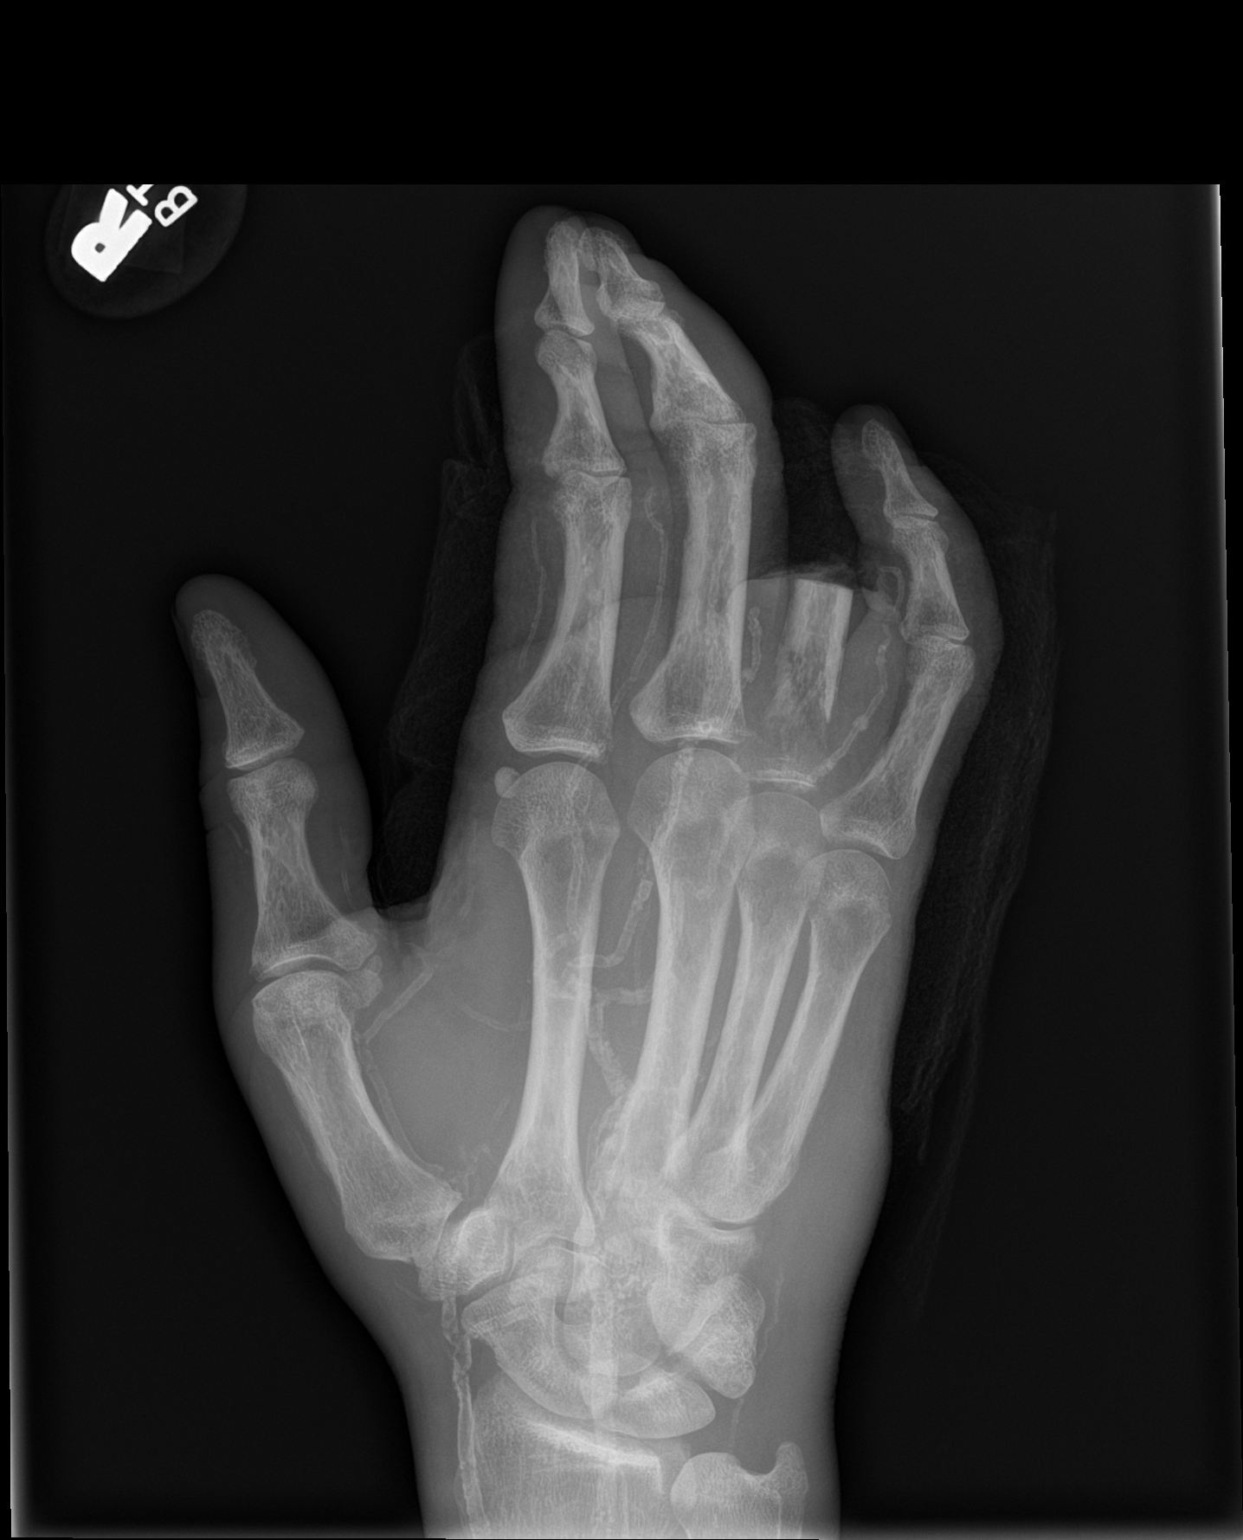

[hand lat]
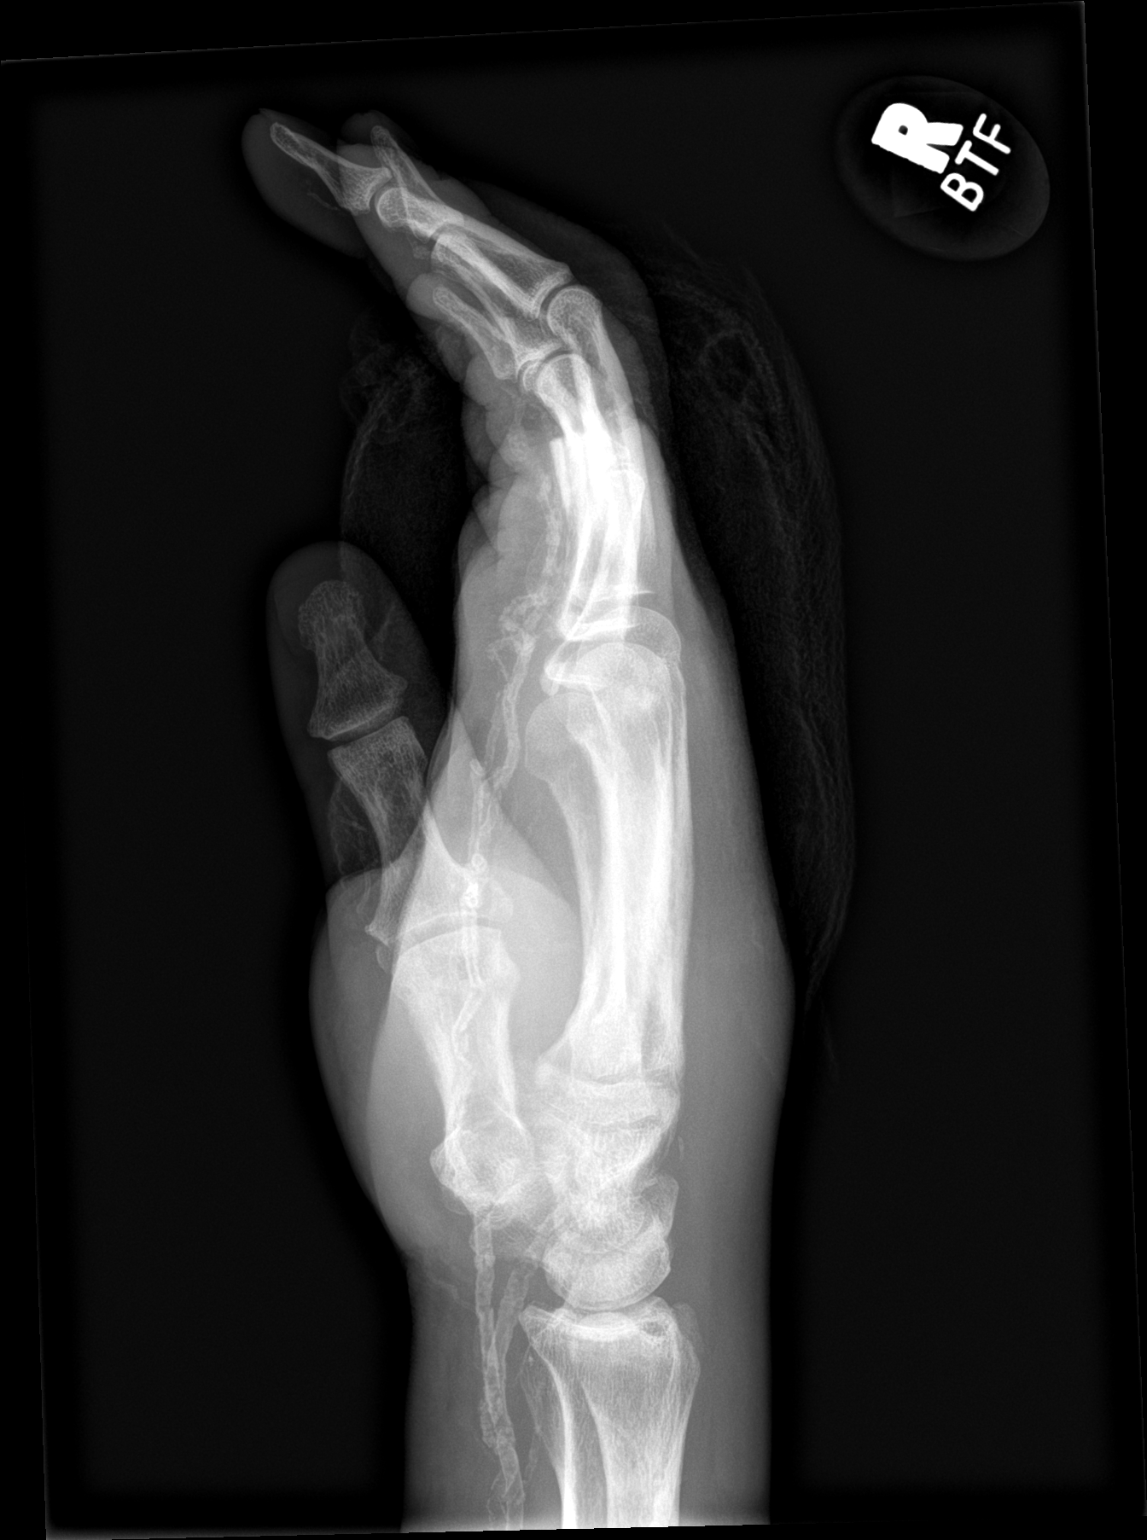

[3 of 3 positions shown; findings below may reference images not displayed]

FINDINGS: Postsurgical changes from ring finger amputation at the level of the
proximal phalanx. New destructive changes involving the base of the
ring finger proximal phalanx compatible with acute osteomyelitis. No
fracture or dislocation. Osseous structures are demineralized.
Severe small vessel atherosclerotic calcifications. Prominent soft
tissue swelling of the hand.
IMPRESSION: New destructive changes involving the base of the ring finger
proximal phalanx compatible with acute osteomyelitis.

## 2023-06-13 ENCOUNTER — Encounter: Admitting: Orthopedic Surgery

## 2023-06-14 ENCOUNTER — Ambulatory Visit (INDEPENDENT_AMBULATORY_CARE_PROVIDER_SITE_OTHER): Admitting: Orthopedic Surgery

## 2023-06-14 ENCOUNTER — Other Ambulatory Visit: Payer: Self-pay

## 2023-06-14 DIAGNOSIS — Z89611 Acquired absence of right leg above knee: Secondary | ICD-10-CM

## 2023-06-16 ENCOUNTER — Emergency Department (HOSPITAL_COMMUNITY)

## 2023-06-16 ENCOUNTER — Other Ambulatory Visit: Payer: Self-pay

## 2023-06-16 ENCOUNTER — Emergency Department (HOSPITAL_COMMUNITY)
Admission: EM | Admit: 2023-06-16 | Discharge: 2023-06-16 | Disposition: A | Discharge status: Home / Self Care | Attending: Emergency Medicine | Admitting: Emergency Medicine

## 2023-06-16 ENCOUNTER — Encounter (HOSPITAL_COMMUNITY): Payer: Self-pay

## 2023-06-16 DIAGNOSIS — Z992 Dependence on renal dialysis: Secondary | ICD-10-CM | POA: Insufficient documentation

## 2023-06-16 DIAGNOSIS — N186 End stage renal disease: Secondary | ICD-10-CM | POA: Diagnosis not present

## 2023-06-16 DIAGNOSIS — E1143 Type 2 diabetes mellitus with diabetic autonomic (poly)neuropathy: Secondary | ICD-10-CM | POA: Insufficient documentation

## 2023-06-16 DIAGNOSIS — I132 Hypertensive heart and chronic kidney disease with heart failure and with stage 5 chronic kidney disease, or end stage renal disease: Secondary | ICD-10-CM | POA: Insufficient documentation

## 2023-06-16 DIAGNOSIS — E11319 Type 2 diabetes mellitus with unspecified diabetic retinopathy without macular edema: Secondary | ICD-10-CM | POA: Insufficient documentation

## 2023-06-16 DIAGNOSIS — G40909 Epilepsy, unspecified, not intractable, without status epilepticus: Secondary | ICD-10-CM | POA: Insufficient documentation

## 2023-06-16 DIAGNOSIS — Z79899 Other long term (current) drug therapy: Secondary | ICD-10-CM | POA: Insufficient documentation

## 2023-06-16 DIAGNOSIS — E1142 Type 2 diabetes mellitus with diabetic polyneuropathy: Secondary | ICD-10-CM | POA: Insufficient documentation

## 2023-06-16 DIAGNOSIS — E1122 Type 2 diabetes mellitus with diabetic chronic kidney disease: Secondary | ICD-10-CM | POA: Insufficient documentation

## 2023-06-16 DIAGNOSIS — D631 Anemia in chronic kidney disease: Secondary | ICD-10-CM | POA: Insufficient documentation

## 2023-06-16 DIAGNOSIS — Z7982 Long term (current) use of aspirin: Secondary | ICD-10-CM | POA: Insufficient documentation

## 2023-06-16 DIAGNOSIS — N50811 Right testicular pain: Secondary | ICD-10-CM | POA: Insufficient documentation

## 2023-06-16 DIAGNOSIS — Z955 Presence of coronary angioplasty implant and graft: Secondary | ICD-10-CM | POA: Insufficient documentation

## 2023-06-16 DIAGNOSIS — I5032 Chronic diastolic (congestive) heart failure: Secondary | ICD-10-CM | POA: Diagnosis not present

## 2023-06-16 DIAGNOSIS — R569 Unspecified convulsions: Secondary | ICD-10-CM | POA: Diagnosis present

## 2023-06-16 DIAGNOSIS — E162 Hypoglycemia, unspecified: Secondary | ICD-10-CM

## 2023-06-16 DIAGNOSIS — N50812 Left testicular pain: Secondary | ICD-10-CM | POA: Insufficient documentation

## 2023-06-16 DIAGNOSIS — I2510 Atherosclerotic heart disease of native coronary artery without angina pectoris: Secondary | ICD-10-CM | POA: Insufficient documentation

## 2023-06-16 DIAGNOSIS — L89151 Pressure ulcer of sacral region, stage 1: Secondary | ICD-10-CM | POA: Diagnosis not present

## 2023-06-16 DIAGNOSIS — Z794 Long term (current) use of insulin: Secondary | ICD-10-CM | POA: Diagnosis not present

## 2023-06-16 DIAGNOSIS — N4889 Other specified disorders of penis: Secondary | ICD-10-CM | POA: Diagnosis not present

## 2023-06-16 DIAGNOSIS — E11649 Type 2 diabetes mellitus with hypoglycemia without coma: Secondary | ICD-10-CM | POA: Insufficient documentation

## 2023-06-16 LAB — CBG MONITORING, ED
Glucose-Capillary: 60 mg/dL — ABNORMAL LOW (ref 70–99)
Glucose-Capillary: 114 mg/dL — ABNORMAL HIGH (ref 70–99)
Glucose-Capillary: 113 mg/dL — ABNORMAL HIGH (ref 70–99)
Glucose-Capillary: 97 mg/dL (ref 70–99)
Glucose-Capillary: 84 mg/dL (ref 70–99)
Glucose-Capillary: 83 mg/dL (ref 70–99)

## 2023-06-16 LAB — CBC WITH DIFFERENTIAL/PLATELET
Abs Immature Granulocytes: 0.02 10*3/uL (ref 0.00–0.07)
Basophils Absolute: 0 10*3/uL (ref 0.0–0.1)
Basophils Relative: 1 %
Eosinophils Absolute: 0.7 10*3/uL — ABNORMAL HIGH (ref 0.0–0.5)
Eosinophils Relative: 9 %
HCT: 28.2 % — ABNORMAL LOW (ref 39.0–52.0)
Hemoglobin: 8.4 g/dL — ABNORMAL LOW (ref 13.0–17.0)
Immature Granulocytes: 0 %
Lymphocytes Relative: 17 %
Lymphs Abs: 1.3 10*3/uL (ref 0.7–4.0)
MCH: 22.3 pg — ABNORMAL LOW (ref 26.0–34.0)
MCHC: 29.8 g/dL — ABNORMAL LOW (ref 30.0–36.0)
MCV: 75 fL — ABNORMAL LOW (ref 80.0–100.0)
Monocytes Absolute: 0.6 10*3/uL (ref 0.1–1.0)
Monocytes Relative: 8 %
Neutro Abs: 4.8 10*3/uL (ref 1.7–7.7)
Neutrophils Relative %: 65 %
Platelets: 145 10*3/uL — ABNORMAL LOW (ref 150–400)
RBC: 3.76 MIL/uL — ABNORMAL LOW (ref 4.22–5.81)
RDW: 23.4 % — ABNORMAL HIGH (ref 11.5–15.5)
WBC: 7.4 10*3/uL (ref 4.0–10.5)
nRBC: 0 % (ref 0.0–0.2)

## 2023-06-16 LAB — RESP PANEL BY RT-PCR (RSV, FLU A&B, COVID)  RVPGX2
Influenza A by PCR: NEGATIVE
Influenza B by PCR: NEGATIVE
Resp Syncytial Virus by PCR: NEGATIVE
SARS Coronavirus 2 by RT PCR: NEGATIVE

## 2023-06-16 LAB — COMPREHENSIVE METABOLIC PANEL WITH GFR
ALT: 20 U/L (ref 0–44)
AST: 15 U/L (ref 15–41)
Albumin: 2.4 g/dL — ABNORMAL LOW (ref 3.5–5.0)
Alkaline Phosphatase: 85 U/L (ref 38–126)
Anion gap: 15 (ref 5–15)
BUN: 71 mg/dL — ABNORMAL HIGH (ref 6–20)
CO2: 21 mmol/L — ABNORMAL LOW (ref 22–32)
Calcium: 8.9 mg/dL (ref 8.9–10.3)
Chloride: 106 mmol/L (ref 98–111)
Creatinine, Ser: 10.29 mg/dL — ABNORMAL HIGH (ref 0.61–1.24)
GFR, Estimated: 5 mL/min — ABNORMAL LOW (ref >60)
Glucose, Bld: 140 mg/dL — ABNORMAL HIGH (ref 70–99)
Potassium: 5.1 mmol/L (ref 3.5–5.1)
Sodium: 142 mmol/L (ref 135–145)
Total Bilirubin: 1 mg/dL (ref 0.0–1.2)
Total Protein: 6.2 g/dL — ABNORMAL LOW (ref 6.5–8.1)

## 2023-06-16 LAB — LIPASE, BLOOD: Lipase: 26 U/L (ref 11–51)

## 2023-06-16 LAB — CK: Total CK: 128 U/L (ref 49–397)

## 2023-06-16 MED ORDER — DEXTROSE 50 % IV SOLN
INTRAVENOUS | Status: AC
  Administered 2023-06-16: 50 mL via INTRAVENOUS
  Filled 2023-06-16: qty 50

## 2023-06-16 MED ORDER — DESITIN MAXIMUM STRENGTH 40 % EX PSTE: 1.0000 | PASTE | Freq: Two times a day (BID) | CUTANEOUS | 0 refills | Status: DC | PRN

## 2023-06-16 MED ORDER — HYDRALAZINE HCL 20 MG/ML IJ SOLN
10.0000 mg | Freq: Once | INTRAMUSCULAR | Status: AC
  Administered 2023-06-16: 10 mg via INTRAVENOUS
  Filled 2023-06-16: qty 1

## 2023-06-16 MED ORDER — DEXTROSE 50 % IV SOLN: 50.0000 mL | Freq: Once | INTRAVENOUS | Status: AC

## 2023-06-16 MED ORDER — LEVETIRACETAM IN NACL 1500 MG/100ML IV SOLN
1500.0000 mg | Freq: Once | INTRAVENOUS | Status: AC
  Administered 2023-06-16: 1500 mg via INTRAVENOUS
  Filled 2023-06-16: qty 100

## 2023-06-16 MED ORDER — SODIUM CHLORIDE 0.9 % IV BOLUS
1000.0000 mL | Freq: Once | INTRAVENOUS | Status: AC
  Administered 2023-06-16: 1000 mL via INTRAVENOUS

## 2023-06-16 NOTE — ED Notes (Signed)
 Patient transported to CT

## 2023-06-16 NOTE — ED Notes (Signed)
 Pt placed on bedpan and removed for BM but pt was unable to have a BM at this time

## 2023-06-16 NOTE — ED Notes (Signed)
 Patient discharged. Provider spoke to patient and wife. Paperwork given to patient and wife and reviewed. Pt and wife verbalized understanding. VSS. A+Ox4.  Awaiting EMS transport, patient unable to ambulate.

## 2023-06-16 NOTE — ED Provider Notes (Signed)
 [place] EMERGENCY DEPARTMENT AT [facility] Provider Note  CSN: [id number] Arrival date & time: 06/16/23 0901  Chief Complaint(s) Hypoglycemia  HPI [name]. is a 53 y.o. male with past medical history as below, significant for CVA, osteomyelitis of RLE w/ R AKA on 05/11/23, afib, bph, gastroparesis, ESRD on HD TTHS who presents to the ED with complaint of hypoglycemia, seizure activity.   He arrives by ambulance, concern for elevated blood pressure, hypoglycemia, seizure.  Glucose was in the 60s on EMS arrival, he was given oral glucagon.  Patient has been having intermittent repetitive arm movements and "panting."  He is on Keppra  for seizures, unclear compliance.  Patient reports he has been feeling unwell for the past 3 days, has not been eating or drinking much because he has discomfort when going to the bathroom due to his sacral wound, has to use the bedpan due to his right leg amputation.  He is experiencing some pain to his testicles and penis over the past few days, reports he frequently urinates on himself because of his ambulation issues.  He also has a wound to his sacrum he feels like may be worsening.    No sig Abdominal pain, nausea vomiting, no fevers or chills, chest pain or dyspnea. Reports he lives at home  Past Medical History Past Medical History:  Diagnosis Date   Acute Bil  CVAs of Bil Frontal amd Rt Parietal  in Watershed Distribution 04/21/2021   Patchy acute cortically-based infarcts within the bilateral high  frontal lobes and right parietal lobe (predominantly in a watershed  distribution).   Subcentimeter acute infarct within the callosal splenium on the  left.     Acute bilateral cerebral infarction in a watershed distribution [facility]) 04/21/2021   a.) MRI brain 04/21/2021 --> patchy ACUTE cortically-based infarcts within the bilateral high frontal lobes and right parietal lobe   Acute cerebral infarction (HCC) 02/13/2021   a.) MRI brain  02/13/2021: acute LEFT hippocampal infarct   Acute cerebral infarction (HCC) 04/21/2021   a.) MRI brain 04/21/2021: subcentimeter ACUTE infarct within the callosal splenium on the LEFT   Acute osteomyelitis of toe of right foot with gangrene (HCC) 02/26/2022   Anaphylactic reaction due to adverse effect of correct drug or medicament properly administered, initial encounter 05/08/2019   Anemia of chronic renal failure    Anxiety    Aortic dilatation (HCC) 11/25/2020   a.) TTE 11/25/2020: Ao root measured 38 mm. b.) TTE 04/22/2021: Ao root measured 44 mm; c. 11/2021 Echo: Ao root 40mm.   Atrial fibrillation (HCC)    a.) CHA2DS2-VASc = 6 (CHF, HTN, CVA x2, prior MI, T2DM). b.) rate/rhythm maintained on oral amiodarone  + carvedilol ; chronic OAC/AP therapy discontinued following ICH   Benign prostatic hyperplasia with lower urinary tract symptoms 11/07/2019   BPH (benign prostatic hyperplasia)    Cardiac arrest (HCC) 07/26/2021   a.) in setting of hyperkalemia, NSTEMI, and seizure following missed HD; pulseless and apneic --> required 1 round of CPR prior to ROSC   Cellulitis    Cerebral hemorrhage (HCC) 11/21/2021   a.) CT head 11/21/2021 --> 1.4 x 1.5 x 3.1 cm ACUTE hemorrhage within the inferior aspect of the fourth ventricle, and extending inferiorly through the foramen of foramen of [place] --> occurred in setting of HTN emergency and DOAC/APT-->eliquis /plavix  d/c'd.   Cerebral microvascular disease    Chronic diarrhea    Chronic heart failure with preserved ejection fraction (HFpEF) (HCC) 07/19/2017   a.) 06/2017 Echo: EF  75%; b.) 11/2020 Echo: EF 55-60%; c.) 04/2021 Echo: EF 55-60%; d.) 11/2021 Echo: EF 65-70%, GrII DD, nl RV fxn, sev dil LA, large circumferential pericardial eff w/o tamponade, triv MR, AoV sclerosis. Ao root 40mm; e.) TTE 02/28/2025: EF 60-65%, sev LVH, mod posterior LV effusion, AoV sclerosis   Chronic pain of both ankles    Coronary artery disease 02/26/2020   a. 02/2020  Ant STEMI/PCI: LM nl, LAD 67m (3.5x30 Resolute Onyx), RI nl, LCX min irregs, RCA min irregs. EF 65%.   DDD (degenerative disc disease), cervical    Diabetes mellitus, type II (HCC)    Diabetic neuropathy (HCC)    Esophagitis    ESRD (end stage renal disease) on dialysis (HCC)    a.) [facility]; T-Th-Sat   Frequent falls    Gait instability    Gastritis    Gastroparesis    GERD (gastroesophageal reflux disease)    H/O enucleation of left eyeball    Heart palpitations    History of kidney stones    HLD (hyperlipidemia)    Hypertension    Insomnia    a.) uses melatonin PRN   Long term current use of amiodarone     Nausea and vomiting in adult    recurrent   NSTEMI (non-ST elevated myocardial infarction) (HCC) 07/26/2021   a.) hyperkalemic from missed HD; seizures; decompensated to cardiac arrest and required CPR x 1 round prior to ROSC.   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  Added automatically from request for surgery [id number]     Osteomyelitis [facility])    Pericardial effusion    a. 11/2021 Echo: EF 65-70%, GrII DD, nl RV fxn, sev dil LA, large circumferential pericardial eff w/o tamponade, triv MR, AoV sclerosis. Ao root 40mm.   Seizure (HCC)    a.) last 07/26/2021 in setting of missed HD --> hyperkalemic at 6.5 --> pulseless/apneic and required CPR; discharged home on levetiracetam .   ST elevation myocardial infarction (STEMI) of anterior wall (HCC) 02/26/2020   a.) LHC/PCI 02/26/2020 --> EF 65%; LVEDP 11 mmHg; 90% mLAD (3.5 x 20 mm Resolute Onyx DES x 1)   Patient Active Problem List   Diagnosis Date Noted   Generalized weakness 05/22/2023   Hx of AKA (above knee amputation), right (HCC) 05/11/2023   Pleural effusion 05/05/2023   Seizures (HCC) 05/05/2023   Right BKA infection (HCC) 04/04/2023   Pressure injury of skin 04/04/2023   Atherosclerosis of native artery of right leg with gangrene  (HCC) 04/02/2023   Weakness 03/24/2023   Diabetic hypoglycemia (HCC) 02/13/2023   S/P BKA (below knee amputation), right (HCC) - 02-09-2023 02/09/2023   Gangrene of right foot (HCC) 02/07/2023   Acute on chronic anemia 02/26/2022   H/O spontaneous intraventricular intracranial hemorrhage 10/2021 - while on plavix  and Eliquis  02/26/2022   History of CVA (cerebrovascular accident) - with ICH while on plavix  and Eliquis  in 11-2021. 02/26/2022   H/O enucleation of left eyeball 02/26/2022   PAD (peripheral artery disease) (HCC) 02/26/2022   History of seizure 02/26/2022   Seasonal allergic rhinitis 07/08/2021   Myoclonus 06/23/2021   Steal syndrome as complication of dialysis access (HCC) 02/06/2021   Coronary artery disease 02/05/2021   Dehiscence of amputation stump of right lower extremity (HCC) 01/29/2021   ESRD on hemodialysis (HCC)    Anemia due to chronic kidney disease, on chronic dialysis (HCC)    Paroxysmal atrial fibrillation (HCC) - not on  systemic anticoagulation due to ICH while on Eliquis  in 11-2021. 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) 08/19/2020   Anxiety state 08/19/2020   Callosity 08/19/2020   ED (erectile dysfunction) of organic origin 08/19/2020   Long term (current) use of insulin  (HCC) 08/19/2020   Myalgia 08/19/2020   CAD/ H/o prior STEMI 02/2020 s/p DES to LAD 08/19/2020   Blindness of left eye 08/15/2020   Combined forms of age-related cataract of right eye 04/17/2020   Moderate protein-calorie malnutrition (HCC) 05/14/2019   Disorder of phosphorus metabolism, unspecified 05/07/2019   Iron deficiency anemia, unspecified 05/07/2019   Secondary hyperparathyroidism of renal origin (HCC) 11/28/2018   Chronic Grade 2 diastolic heart failure/EF 55 to 60% 12/13/2017   Diabetes mellitus, type II (HCC) 11/23/2017   Diabetic retinopathy (HCC) 07/05/2017   Uncontrolled type 2 diabetes mellitus with hyperglycemia, with long-term current use of insulin  (HCC)  04/20/2016   Mixed hyperlipidemia    Essential hypertension, benign    Home Medication(s) Prior to Admission medications   Medication Sig Start Date End Date Taking? Authorizing Provider  acetaminophen  (TYLENOL ) 325 MG tablet Take 2 tablets (650 mg total) by mouth every 6 (six) hours as needed for mild pain (pain score 1-3) or fever (or Fever >/= 101). 05/26/23  Yes Emokpae, Courage, MD  amiodarone  (PACERONE ) 200 MG tablet Take 1 tablet (200 mg total) by mouth daily. 05/26/23 05/26/24 Yes Emokpae, Courage, MD  ammonium lactate  (AMLACTIN DAILY) 12 % lotion Apply topically. 02/25/23  Yes [provider]  ascorbic acid  (VITAMIN C ) 500 MG tablet Take 1,000 mg by mouth daily.   Yes [provider]  atorvastatin  (LIPITOR ) 80 MG tablet Take 1 tablet (80 mg total) by mouth at bedtime. 05/26/23  Yes Emokpae, Courage, MD  brimonidine  (ALPHAGAN ) 0.2 % ophthalmic solution Place 1 drop into the right eye 3 (three) times daily. Patient taking differently: Place 1 drop into the right eye 2 (two) times daily. 05/30/23  Yes Ninetta Basket, MD  calcitRIOL  (ROCALTROL ) 0.25 MCG capsule Take 9 capsules (2.25 mcg total) by mouth every Tuesday, Thursday, and Saturday at 6 PM. Patient taking differently: Take 0.25 mcg by mouth in the morning, at noon, and at bedtime. Every Tues-Thur-Sat 02/24/23  Yes Danford, Willis Harter, MD  calcium  acetate (PHOSLO ) 667 MG capsule Take 2 capsules (1,334 mg total) by mouth 3 (three) times daily with meals. 05/04/21  Yes Love, Renay Carota, PA-C  carvedilol  (COREG ) 12.5 MG tablet Take 1 tablet (12.5 mg total) by mouth 2 (two) times daily. 05/26/23  Yes Colin Dawley, MD  cinacalcet  (SENSIPAR ) 30 MG tablet Take 2 tablets (60 mg total) by mouth every Tuesday, Thursday, and Saturday at 6 PM. 02/24/23  Yes Danford, Willis Harter, MD  dorzolamide -timolol  (COSOPT ) 2-0.5 % ophthalmic solution Place 1 drop into the right eye 2 (two) times daily. 11/15/22 11/15/23 Yes [provider]   levETIRAcetam  (KEPPRA ) 500 MG tablet Take 1 tablet (500 mg total) by mouth 4 (four) times a week. On Sunday, Monday, Wednesday and Friday, all non-dialysis days 05/26/23  Yes Emokpae, Courage, MD  levETIRAcetam  (KEPPRA ) 750 MG tablet Take 1 tablet (750 mg total) by mouth 3 (three) times a week. On Tuesday Thursday and Saturday, on dialysis days 05/27/23  Yes Emokpae, Courage, MD  lidocaine -prilocaine  (EMLA ) cream Apply 1 Application topically 3 (three) times a week. 12/24/21  Yes [provider]  loperamide  (IMODIUM ) 2 MG capsule Take 1 capsule (2 mg total) by mouth as needed for  diarrhea or loose stools. 02/23/23  Yes Danford, Willis Harter, MD  mupirocin  ointment (BACTROBAN ) 2 % Apply topically. 08/20/21  Yes [provider]  ondansetron  (ZOFRAN ) 4 MG tablet Take 1 tablet (4 mg total) by mouth every 6 (six) hours as needed for nausea or vomiting. 03/17/23  Yes Adel Aden, PA-C  polyethylene glycol (MIRALAX  / GLYCOLAX ) 17 g packet Take 17 g by mouth daily. 05/26/23  Yes Emokpae, Courage, MD  Zinc  Oxide (DESITIN MAXIMUM STRENGTH) 40 % PSTE Apply 1 Application topically 2 (two) times daily as needed. 06/16/23  Yes Russella Courts A, DO  Amino Acids-Protein Hydrolys (PRO-STAT AWC) LIQD Take 30 mLs by mouth every morning. 06/10/23   [provider]  aspirin  EC 81 MG tablet Take 1 tablet (81 mg total) by mouth daily with breakfast. Swallow whole. 05/26/23   Colin Dawley, MD  multivitamin (RENA-VIT) TABS tablet Take 1 tablet by mouth at bedtime. Patient not taking: Reported on 06/16/2023 04/12/23   Rai, Hurman Maiden, MD  nutrition supplement, JUVEN, (JUVEN) PACK Take 1 packet by mouth 2 (two) times daily between meals. Patient not taking: Reported on 06/16/2023 02/23/23   Ephriam Hashimoto, MD  Nutritional Supplements (,FEEDING SUPPLEMENT, PROSOURCE PLUS) liquid Take 30 mLs by mouth 3 (three) times daily between meals. Patient not taking: Reported on 06/16/2023 02/23/23   Ephriam Hashimoto, MD  oxyCODONE  (OXY IR/ROXICODONE ) 5 MG immediate release tablet Take 1 tablet (5 mg total) by mouth every 4 (four) hours as needed for moderate pain (pain score 4-6). 05/26/23   Colin Dawley, MD                                                                                                                                    Past Surgical History Past Surgical History:  Procedure Laterality Date   A/V FISTULAGRAM Left 08/31/2021   Procedure: A/V Fistulagram;  Surgeon: Celso College, MD;  Location: [name] INVASIVE CV LAB;  Service: Cardiovascular;  Laterality: Left;   A/V FISTULAGRAM Left 01/25/2022   Procedure: A/V Fistulagram;  Surgeon: Celso College, MD;  Location: [name] INVASIVE CV LAB;  Service: Cardiovascular;  Laterality: Left;   A/V FISTULAGRAM Right 10/18/2022   Procedure: A/V Fistulagram;  Surgeon: Celso College, MD;  Location: [name] INVASIVE CV LAB;  Service: Cardiovascular;  Laterality: Right;   ABDOMINAL AORTOGRAM W/LOWER EXTREMITY Bilateral 02/08/2023   Procedure: ABDOMINAL AORTOGRAM W/LOWER EXTREMITY;  Surgeon: Philipp Brawn, MD;  Location: [facility] INVASIVE CV LAB;  Service: Vascular;  Laterality: Bilateral;   AMPUTATION Left 03/16/2016   Procedure: AMPUTATION DIGIT LEFT HALLUX;  Surgeon: Charity Conch, DPM;  Location: [facility] OR;  Service: Podiatry;  Laterality: Left;  can start around 5    AMPUTATION Right 02/09/2021   Procedure: AMPUTATION DIGIT;  Surgeon: Marilyn Shropshire, MD;  Location: [facility] OR;  Service: Orthopedics;  Laterality: Right;   AMPUTATION Right 02/04/2023   Procedure: AMPUTATION  4TH AND 5TH METATARSAL;  Surgeon: Evertt Hoe, DPM;  Location: [facility] OR;  Service: Orthopedics/Podiatry;  Laterality: Right;  I&D, partial ray resection, abx beads   AMPUTATION Right 02/09/2023   Procedure: RIGHT BELOW KNEE AMPUTATION;  Surgeon: Timothy Ford, MD;  Location: [facility] OR;  Service: Orthopedics;  Laterality: Right;   AMPUTATION Right 04/06/2023   Procedure: RIGHT ABOVE  KNEE AMPUTATION;  Surgeon: Timothy Ford, MD;  Location: [facility] OR;  Service: Orthopedics;  Laterality: Right;   AMPUTATION Right 05/11/2023   Procedure: RIGHT ABOVE KNEE AMPUTATION;  Surgeon: Timothy Ford, MD;  Location: [facility] OR;  Service: Orthopedics;  Laterality: Right;  REVISION RIGHT ABOVE KNEE AMPUTATION   AMPUTATION TOE Right 02/28/2022   Procedure: AMPUTATION TOE;  Surgeon: Jennefer Moats, DPM;  Location: [name] ORS;  Service: Podiatry;  Laterality: Right;  right fifth toe   ARTHROSCOPIC REPAIR ACL Left    AV FISTULA PLACEMENT Right 05/31/2019   Procedure: Brachiocephalic AV fistula creation;  Surgeon: Celso College, MD;  Location: [name] ORS;  Service: Vascular;  Laterality: Right;   AV FISTULA PLACEMENT Left 08/13/2021   Procedure: ARTERIOVENOUS (AV) FISTULA CREATION (BRACHIALCEPHALIC);  Surgeon: Celso College, MD;  Location: [name] ORS;  Service: Vascular;  Laterality: Left;   CHEST TUBE INSERTION Right 05/06/2023   Procedure: CHEST TUBE INSERTION;  Surgeon: [name] Nigh, MD;  Location: [facility] ENDOSCOPY;  Service: Pulmonary;  Laterality: Right;   COLONOSCOPY WITH PROPOFOL  N/A 10/28/2015   Procedure: COLONOSCOPY WITH PROPOFOL ;  Surgeon: Deveron Fly, MD;  Location: [facility] ENDOSCOPY;  Service: Endoscopy;  Laterality: N/A;   COLONOSCOPY WITH PROPOFOL  N/A 10/29/2015   Procedure: COLONOSCOPY WITH PROPOFOL ;  Surgeon: Deveron Fly, MD;  Location: [facility] ENDOSCOPY;  Service: Endoscopy;  Laterality: N/A;   COLONOSCOPY WITH PROPOFOL  N/A 01/27/2023   Procedure: COLONOSCOPY WITH PROPOFOL ;  Surgeon: Hargis Lias, MD;  Location: [facility] ENDO SUITE;  Service: Endoscopy;  Laterality: N/A;  12:30pm, asa 3   CORONARY ULTRASOUND/IVUS N/A 02/26/2020   Procedure: Intravascular Ultrasound/IVUS;  Surgeon: Lucendia Rusk, MD;  Location: [facility] INVASIVE CV LAB;  Service: Cardiovascular;  Laterality: N/A;   CORONARY/GRAFT ACUTE MI REVASCULARIZATION N/A 02/26/2020   Procedure: Coronary/Graft Acute MI Revascularization;   Surgeon: Lucendia Rusk, MD;  Location: [facility] INVASIVE CV LAB;  Service: Cardiovascular;  Laterality: N/A;   DIALYSIS/PERMA CATHETER INSERTION Right 04/26/2019   Perm Cath    DIALYSIS/PERMA CATHETER INSERTION N/A 04/26/2019   Procedure: DIALYSIS/PERMA CATHETER INSERTION;  Surgeon: Celso College, MD;  Location: [name] INVASIVE CV LAB;  Service: Cardiovascular;  Laterality: N/A;   DIALYSIS/PERMA CATHETER INSERTION N/A 05/25/2021   Procedure: DIALYSIS/PERMA CATHETER INSERTION;  Surgeon: Celso College, MD;  Location: [name] INVASIVE CV LAB;  Service: Cardiovascular;  Laterality: N/A;   DIALYSIS/PERMA CATHETER INSERTION N/A 08/31/2021   Procedure: DIALYSIS/PERMA CATHETER INSERTION;  Surgeon: Celso College, MD;  Location: [name] INVASIVE CV LAB;  Service: Cardiovascular;  Laterality: N/A;   DIALYSIS/PERMA CATHETER REMOVAL N/A 08/15/2019   Procedure: DIALYSIS/PERMA CATHETER REMOVAL;  Surgeon: Jackquelyn Mass, MD;  Location: [name] INVASIVE CV LAB;  Service: Cardiovascular;  Laterality: N/A;   DIALYSIS/PERMA CATHETER REMOVAL N/A 03/15/2022   Procedure: DIALYSIS/PERMA CATHETER REMOVAL;  Surgeon: Celso College, MD;  Location: [name] INVASIVE CV LAB;  Service: Cardiovascular;  Laterality: N/A;   EMBOLIZATION (CATH LAB) Right 02/06/2021   Procedure: EMBOLIZATION;  Surgeon: Celso College, MD;  Location: [name] INVASIVE CV LAB;  Service: Cardiovascular;  Laterality: Right;  Right Upper Extremity  Dialysis Access, Permcath Placement   ESOPHAGOGASTRODUODENOSCOPY (EGD) WITH PROPOFOL  N/A 12/27/2017   Procedure: ESOPHAGOGASTRODUODENOSCOPY (EGD) WITH PROPOFOL ;  Surgeon: Toledo, Alphonsus Jeans, MD;  Location: [name] ENDOSCOPY;  Service: Gastroenterology;  Laterality: N/A;   EYE SURGERY     IR THORACENTESIS ASP PLEURAL SPACE W/IMG GUIDE  05/05/2023   LEFT HEART CATH AND CORONARY ANGIOGRAPHY N/A 02/26/2020   Procedure: LEFT HEART CATH AND CORONARY ANGIOGRAPHY;  Surgeon: Lucendia Rusk, MD;  Location: [facility] INVASIVE CV LAB;  Service:  Cardiovascular;  Laterality: N/A;   PROSTATE SURGERY  2016   REVISON OF ARTERIOVENOUS FISTULA Right 07/22/2022   Procedure: RESECTION OF ANEURYSMAL RIGHT ARM ARTERIOVENOUS FISTULA;  Surgeon: Celso College, MD;  Location: [name] ORS;  Service: Vascular;  Laterality: Right;   TONSILECTOMY/ADENOIDECTOMY WITH MYRINGOTOMY     TONSILLECTOMY     UPPER EXTREMITY ANGIOGRAPHY Right 11/26/2020   Procedure: Upper Extremity Angiography;  Surgeon: Celso College, MD;  Location: [name] INVASIVE CV LAB;  Service: Cardiovascular;  Laterality: Right;   UPPER EXTREMITY ANGIOGRAPHY Right 02/05/2021   Procedure: UPPER EXTREMITY ANGIOGRAPHY;  Surgeon: Celso College, MD;  Location: [name] INVASIVE CV LAB;  Service: Cardiovascular;  Laterality: Right;   Family History Family History  Problem Relation Age of Onset   CAD Father    Stroke Father    Diabetes Mellitus II Mother    Kidney failure Mother    Schizophrenia Mother     Social History Social History   Tobacco Use   Smoking status: Never   Smokeless tobacco: Never  Vaping Use   Vaping status: Never Used  Substance Use Topics   Alcohol  use: No   Drug use: No   Allergies Promethazine   Review of Systems A thorough review of systems was obtained and all systems are negative except as noted in the HPI and PMH.   Physical Exam Vital Signs  I have reviewed the triage vital signs BP (!) 200/81   Pulse 60   Temp 98 F (36.7 C) (Rectal)   Resp 16   Ht 6\' 3"  (1.905 m)   Wt 74.4 kg   SpO2 96%   BMI 20.50 kg/m  Physical Exam Vitals and nursing note reviewed. Exam conducted with a chaperone present (RN D FOWLER).  Constitutional:      General: He is not in acute distress.    Appearance: He is well-developed.  HENT:     Head: Normocephalic and atraumatic.     Right Ear: External ear normal.     Left Ear: External ear normal.     Mouth/Throat:     Mouth: Mucous membranes are dry.  Eyes:     Comments: Left enucleation  Cardiovascular:     Rate and  Rhythm: Normal rate and regular rhythm.     Pulses: Normal pulses.     Heart sounds: Normal heart sounds.  Pulmonary:     Effort: Pulmonary effort is normal. No respiratory distress.     Breath sounds: Normal breath sounds.  Abdominal:     General: Abdomen is flat.     Palpations: Abdomen is soft.     Tenderness: There is no abdominal tenderness.  Musculoskeletal:     Cervical back: No rigidity.     Right lower leg: No edema.     Left lower leg: No edema.     Comments: S/p RLE AKA, stump appears clean/dry, wound intact  Sacral wound, appears stage 1  GU exam with superficial irritation to testicles, no evidence of fourner  gangrene or overt cellulitis    Skin:    General: Skin is warm and dry.     Capillary Refill: Capillary refill takes less than 2 seconds.  Neurological:     Mental Status: He is alert and oriented to person, place, and time.     GCS: GCS eye subscore is 4. GCS verbal subscore is 5. GCS motor subscore is 6.     Cranial Nerves: Cranial nerves 2-12 are intact. No dysarthria.     Sensory: Sensation is intact.     Motor: Motor function is intact.     Coordination: Coordination is intact.     Comments: Gait testing deferred  Psychiatric:        Mood and Affect: Mood normal.        Behavior: Behavior normal.     ED Results and Treatments Labs (all labs ordered are listed, but only abnormal results are displayed) Labs Reviewed  CBC WITH DIFFERENTIAL/PLATELET - Abnormal; Notable for the following components:      Result Value   RBC 3.76 (*)    Hemoglobin 8.4 (*)    HCT 28.2 (*)    MCV 75.0 (*)    MCH 22.3 (*)    MCHC 29.8 (*)    RDW 23.4 (*)    Platelets 145 (*)    Eosinophils Absolute 0.7 (*)    All other components within normal limits  COMPREHENSIVE METABOLIC PANEL WITH GFR - Abnormal; Notable for the following components:   CO2 21 (*)    Glucose, Bld 140 (*)    BUN 71 (*)    Creatinine, Ser 10.29 (*)    Total Protein 6.2 (*)    Albumin  2.4 (*)     GFR, Estimated 5 (*)    All other components within normal limits  CBG MONITORING, ED - Abnormal; Notable for the following components:   Glucose-Capillary 60 (*)    All other components within normal limits  CBG MONITORING, ED - Abnormal; Notable for the following components:   Glucose-Capillary 114 (*)    All other components within normal limits  CBG MONITORING, ED - Abnormal; Notable for the following components:   Glucose-Capillary 113 (*)    All other components within normal limits  RESP PANEL BY RT-PCR (RSV, FLU A&B, COVID)  RVPGX2  LIPASE, BLOOD  CK  LEVETIRACETAM  LEVEL  CBG MONITORING, ED  CBG MONITORING, ED  CBG MONITORING, ED                                                                                                                          Radiology No results found.  Pertinent labs & imaging results that were available during my care of the patient were reviewed by me and considered in my medical decision making (see MDM for details).  Medications Ordered in ED Medications  sodium chloride  0.9 % bolus 1,000 mL (0 mLs Intravenous Stopped 06/16/23 0946)  dextrose  50 % solution 50 mL (50 mLs Intravenous  Given by Other 06/16/23 0915)  hydrALAZINE  (APRESOLINE ) injection 10 mg (10 mg Intravenous Given 06/16/23 0948)  levETIRAcetam  (KEPPRA ) IVPB 1500 mg/ 100 mL premix (0 mg Intravenous Stopped 06/16/23 1205)  hydrALAZINE  (APRESOLINE ) injection 10 mg (10 mg Intravenous Given 06/16/23 1152)  hydrALAZINE  (APRESOLINE ) injection 10 mg (10 mg Intravenous Given 06/16/23 1447)                                                                                                                                     Procedures Procedures  (including critical care time)  Medical Decision Making / ED Course    Medical Decision Making:    Kadarious Jourdain Guay. is a 53 y.o. male with past medical history as below, significant for CVA, osteomyelitis of RLE w/ R AKA on 05/11/23, afib,  bph, gastroparesis, ESRD on HD TTHS who presents to the ED with complaint of hypoglycemia, seizure activity. . The complaint involves an extensive differential diagnosis and also carries with it a high risk of complications and morbidity.  Serious etiology was considered. Ddx includes but is not limited to: Differential diagnoses for altered mental status includes but is not exclusive to alcohol , illicit or prescription medications, intracranial pathology such as stroke, intracerebral hemorrhage, fever or infectious causes including sepsis, hypoxemia, uremia, trauma, endocrine related disorders such as diabetes, hypoglycemia, thyroid -related diseases, etc.   Complete initial physical exam performed, notably the patient was in no acute distress, he is HDS, blood pressure is elevated..    Reviewed and confirmed nursing documentation for past medical history, family history, social history.  Vital signs reviewed.     Brief summary:  53 year old male with history above including CVA, right leg AKA, A-fib, ESRD on HD here with hypoglycemia, elevated blood pressure, seizure-like activity.    Clinical Course as of 06/18/23 0758  Thu Jun 16, 2023  0957 Creatinine(!): 10.29 ESRD on HD [SG]  1024 Hemoglobin(!): 8.4 Similar prior [SG]  1134 Pt with seizure like activity lasting around 30-45 seconds, brief post ictal period first episode. After second episode he had no post ictal period. Back to baseline.  [SG]  1304 Feeling better [SG]  1548 Feeling better, bp improved, he remains at baseline. Tolerating po w/o difficulty.  [SG]    Clinical Course User Index [SG] Russella Courts A, DO     Overall he is feeling better, blood pressure somewhat elevated but he did miss his dialysis today.  Extensive discussion with patient and spouse at bedside.  He was able to eat a meal in the ER, glucose has improved. Symptoms have improved, observation was offered. His workup is stable. Bp has improved.  They will go  to dialysis tomorrow.   Patient in no distress and overall condition improved here in the ED. Detailed discussions were had with the patient/guardian regarding current findings, and need for close f/u with PCP or on call doctor. The patient/guardian has been instructed to return  immediately if the symptoms worsen in any way for re-evaluation. Patient/guardian verbalized understanding and is in agreement with current care plan. All questions answered prior to discharge.             Additional history obtained: -Additional history obtained from spouse ems -External records from outside source obtained and reviewed including: Chart review including previous notes, labs, imaging, consultation notes including  Prior admission Prior er visits    Lab Tests: -I ordered, reviewed, and interpreted labs.   The pertinent results include:   Labs Reviewed  CBC WITH DIFFERENTIAL/PLATELET - Abnormal; Notable for the following components:      Result Value   RBC 3.76 (*)    Hemoglobin 8.4 (*)    HCT 28.2 (*)    MCV 75.0 (*)    MCH 22.3 (*)    MCHC 29.8 (*)    RDW 23.4 (*)    Platelets 145 (*)    Eosinophils Absolute 0.7 (*)    All other components within normal limits  COMPREHENSIVE METABOLIC PANEL WITH GFR - Abnormal; Notable for the following components:   CO2 21 (*)    Glucose, Bld 140 (*)    BUN 71 (*)    Creatinine, Ser 10.29 (*)    Total Protein 6.2 (*)    Albumin  2.4 (*)    GFR, Estimated 5 (*)    All other components within normal limits  CBG MONITORING, ED - Abnormal; Notable for the following components:   Glucose-Capillary 60 (*)    All other components within normal limits  CBG MONITORING, ED - Abnormal; Notable for the following components:   Glucose-Capillary 114 (*)    All other components within normal limits  CBG MONITORING, ED - Abnormal; Notable for the following components:   Glucose-Capillary 113 (*)    All other components within normal limits  RESP PANEL  BY RT-PCR (RSV, FLU A&B, COVID)  RVPGX2  LIPASE, BLOOD  CK  LEVETIRACETAM  LEVEL  CBG MONITORING, ED  CBG MONITORING, ED  CBG MONITORING, ED    Notable for labs stable  EKG   EKG Interpretation Date/Time:  Thursday June 16 2023 09:10:48 EDT Ventricular Rate:  63 PR Interval:  237 QRS Duration:  131 QT Interval:  489 QTC Calculation: 501 R Axis:   79  Text Interpretation: Sinus rhythm Prolonged PR interval LVH with secondary repolarization abnormality Prolonged QT interval similar prior no stemi Confirmed by Russella Courts ([id number]) on 06/16/2023 1:36:42 PM         Imaging Studies ordered: I ordered imaging studies including cth cxr I independently visualized the following imaging with scope of interpretation limited to determining acute life threatening conditions related to emergency care; findings noted above I agree with the radiologist interpretation If any imaging was obtained with contrast I closely monitored patient for any possible adverse reaction a/w contrast administration in the emergency department   Medicines ordered and prescription drug management: Meds ordered this encounter  Medications   dextrose  50 % solution    Talbott, Tina S: cabinet override   sodium chloride  0.9 % bolus 1,000 mL   dextrose  50 % solution 50 mL   hydrALAZINE  (APRESOLINE ) injection 10 mg   levETIRAcetam  (KEPPRA ) IVPB 1500 mg/ 100 mL premix   hydrALAZINE  (APRESOLINE ) injection 10 mg   hydrALAZINE  (APRESOLINE ) injection 10 mg   Zinc  Oxide (DESITIN MAXIMUM STRENGTH) 40 % PSTE    Sig: Apply 1 Application topically 2 (two) times daily as needed.    Dispense:  453  g    Refill:  0    -I have reviewed the patients home medicines and have made adjustments as needed   Consultations Obtained: na   Cardiac Monitoring: The patient was maintained on a cardiac monitor.  I personally viewed and interpreted the cardiac monitored which showed an underlying rhythm of: nsr Continuous pulse  oximetry interpreted by myself, 99% on ra.    Social Determinants of Health:  Diagnosis or treatment significantly limited by social determinants of health: na   Reevaluation: After the interventions noted above, I reevaluated the patient and found that they have improved  Co morbidities that complicate the patient evaluation  Past Medical History:  Diagnosis Date   Acute Bil  CVAs of Bil Frontal amd Rt Parietal  in Watershed Distribution 04/21/2021   Patchy acute cortically-based infarcts within the bilateral high  frontal lobes and right parietal lobe (predominantly in a watershed  distribution).   Subcentimeter acute infarct within the callosal splenium on the  left.     Acute bilateral cerebral infarction in a watershed distribution [facility]) 04/21/2021   a.) MRI brain 04/21/2021 --> patchy ACUTE cortically-based infarcts within the bilateral high frontal lobes and right parietal lobe   Acute cerebral infarction (HCC) 02/13/2021   a.) MRI brain 02/13/2021: acute LEFT hippocampal infarct   Acute cerebral infarction (HCC) 04/21/2021   a.) MRI brain 04/21/2021: subcentimeter ACUTE infarct within the callosal splenium on the LEFT   Acute osteomyelitis of toe of right foot with gangrene (HCC) 02/26/2022   Anaphylactic reaction due to adverse effect of correct drug or medicament properly administered, initial encounter 05/08/2019   Anemia of chronic renal failure    Anxiety    Aortic dilatation (HCC) 11/25/2020   a.) TTE 11/25/2020: Ao root measured 38 mm. b.) TTE 04/22/2021: Ao root measured 44 mm; c. 11/2021 Echo: Ao root 40mm.   Atrial fibrillation (HCC)    a.) CHA2DS2-VASc = 6 (CHF, HTN, CVA x2, prior MI, T2DM). b.) rate/rhythm maintained on oral amiodarone  + carvedilol ; chronic OAC/AP therapy discontinued following ICH   Benign prostatic hyperplasia with lower urinary tract symptoms 11/07/2019   BPH (benign prostatic hyperplasia)    Cardiac arrest (HCC) 07/26/2021   a.) in setting of  hyperkalemia, NSTEMI, and seizure following missed HD; pulseless and apneic --> required 1 round of CPR prior to ROSC   Cellulitis    Cerebral hemorrhage (HCC) 11/21/2021   a.) CT head 11/21/2021 --> 1.4 x 1.5 x 3.1 cm ACUTE hemorrhage within the inferior aspect of the fourth ventricle, and extending inferiorly through the foramen of foramen of [place] --> occurred in setting of HTN emergency and DOAC/APT-->eliquis /plavix  d/c'd.   Cerebral microvascular disease    Chronic diarrhea    Chronic heart failure with preserved ejection fraction (HFpEF) (HCC) 07/19/2017   a.) 06/2017 Echo: EF 75%; b.) 11/2020 Echo: EF 55-60%; c.) 04/2021 Echo: EF 55-60%; d.) 11/2021 Echo: EF 65-70%, GrII DD, nl RV fxn, sev dil LA, large circumferential pericardial eff w/o tamponade, triv MR, AoV sclerosis. Ao root 40mm; e.) TTE 02/28/2025: EF 60-65%, sev LVH, mod posterior LV effusion, AoV sclerosis   Chronic pain of both ankles    Coronary artery disease 02/26/2020   a. 02/2020 Ant STEMI/PCI: LM nl, LAD 44m (3.5x30 Resolute Onyx), RI nl, LCX min irregs, RCA min irregs. EF 65%.   DDD (degenerative disc disease), cervical    Diabetes mellitus, type II (HCC)    Diabetic neuropathy (HCC)    Esophagitis    ESRD (  end stage renal disease) on dialysis [facility])    a.) [facility]; T-Th-Sat   Frequent falls    Gait instability    Gastritis    Gastroparesis    GERD (gastroesophageal reflux disease)    H/O enucleation of left eyeball    Heart palpitations    History of kidney stones    HLD (hyperlipidemia)    Hypertension    Insomnia    a.) uses melatonin PRN   Long term current use of amiodarone     Nausea and vomiting in adult    recurrent   NSTEMI (non-ST elevated myocardial infarction) (HCC) 07/26/2021   a.) hyperkalemic from missed HD; seizures; decompensated to cardiac arrest and required CPR x 1 round prior to ROSC.   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  Added automatically from request for surgery [id number]     Osteomyelitis [facility])    Pericardial effusion    a. 11/2021 Echo: EF 65-70%, GrII DD, nl RV fxn, sev dil LA, large circumferential pericardial eff w/o tamponade, triv MR, AoV sclerosis. Ao root 40mm.   Seizure (HCC)    a.) last 07/26/2021 in setting of missed HD --> hyperkalemic at 6.5 --> pulseless/apneic and required CPR; discharged home on levetiracetam .   ST elevation myocardial infarction (STEMI) of anterior wall (HCC) 02/26/2020   a.) LHC/PCI 02/26/2020 --> EF 65%; LVEDP 11 mmHg; 90% mLAD (3.5 x 20 mm Resolute Onyx DES x 1)      Dispostion: Disposition decision including need for hospitalization was considered, and patient discharged from emergency department.    Final Clinical Impression(s) / ED Diagnoses Final diagnoses:  Hypoglycemia  Seizure-like activity (HCC)        Teddi Favors, DO 06/18/23 [phone]

## 2023-06-16 NOTE — Discharge Instructions (Signed)
 It was a pleasure caring for you today in the emergency department.  Be sure to check blood sugar daily at home, typically around meal time. Be sure [name] is eating throughout the day. Please follow up with PCP in the next few days and with neurology. Be sure to go to dialysis tomorrow.   Please return to the emergency department for any worsening or worrisome symptoms.      Per [place] statutes, patients with seizures are not allowed to drive until they have been seizure-free for six months.  Other recommendations include using caution when using heavy equipment or power tools. Avoid working on ladders or at heights. Take showers instead of baths.  Do not swim alone.  Ensure the water  temperature is not too high on the home water  heater. Do not go swimming alone. Do not lock yourself in a room alone (i.e. bathroom). When caring for infants or small children, sit down when holding, feeding, or changing them to minimize risk of injury to the child in the event you have a seizure. Maintain good sleep hygiene. Avoid alcohol .  Also recommend adequate sleep, hydration, good diet and minimize stress.     During the Seizure   - First, ensure adequate ventilation and place patients on the floor on their left side  Loosen clothing around the neck and ensure the airway is patent. If the patient is clenching the teeth, do not force the mouth open with any object as this can cause severe damage - Remove all items from the surrounding that can be hazardous. The patient may be oblivious to what's happening and may not even know what he or she is doing. If the patient is confused and wandering, either gently guide him/her away and block access to outside areas - Reassure the individual and be comforting - Call 911. In most cases, the seizure ends before EMS arrives. However, there are cases when seizures may last over 3 to 5 minutes. Or the individual may have developed breathing difficulties or  severe injuries. If a pregnant patient or a person with diabetes develops a seizure, it is prudent to call an ambulance. - Finally, if the patient does not regain full consciousness, then call EMS. Most patients will remain confused for about 45 to 90 minutes after a seizure, so you must use judgment in calling for help. - Avoid restraints but make sure the patient is in a bed with padded side rails - Place the individual in a lateral position with the neck slightly flexed; this will help the saliva drain from the mouth and prevent the tongue from falling backward - Remove all nearby furniture and other hazards from the area - Provide verbal assurance as the individual is regaining consciousness - Provide the patient with privacy if possible - Call for help and start treatment as ordered by the caregiver   After the Seizure (Postictal Stage)   After a seizure, most patients experience confusion, fatigue, muscle pain and/or a headache. Thus, one should permit the individual to sleep. For the next few days, reassurance is essential. Being calm and helping reorient the person is also of importance.   Most seizures are painless and end spontaneously. Seizures are not harmful to others but can lead to complications such as stress on the lungs, brain and the heart. Individuals with prior lung problems may develop labored breathing and respiratory distress.

## 2023-06-16 NOTE — ED Notes (Signed)
[facility] called for transport

## 2023-06-16 NOTE — ED Triage Notes (Signed)
 Pt BIB [facility] for HTN crisis, seizures, and CBG 61. Pt was given oral glucagon and having intermittent episodes "heavy breathing and LOC." Pt also has sacral wound on his bottom that is believe to be infected. [name] at bedside during triage.

## 2023-06-18 LAB — LEVETIRACETAM LEVEL: Levetiracetam Lvl: 25.3 ug/mL (ref 10.0–40.0)

## 2023-06-20 ENCOUNTER — Emergency Department (HOSPITAL_COMMUNITY)

## 2023-06-20 ENCOUNTER — Other Ambulatory Visit: Payer: Self-pay

## 2023-06-20 ENCOUNTER — Inpatient Hospital Stay (HOSPITAL_COMMUNITY)
Admission: EM | Admit: 2023-06-20 | Discharge: 2023-07-08 | DRG: 239 | Disposition: A | Discharge status: Skilled Nursing Facility | Source: Ambulatory Visit | Attending: Internal Medicine | Admitting: Internal Medicine

## 2023-06-20 DIAGNOSIS — E785 Hyperlipidemia, unspecified: Secondary | ICD-10-CM | POA: Diagnosis present

## 2023-06-20 DIAGNOSIS — E1122 Type 2 diabetes mellitus with diabetic chronic kidney disease: Secondary | ICD-10-CM | POA: Diagnosis present

## 2023-06-20 DIAGNOSIS — D631 Anemia in chronic kidney disease: Secondary | ICD-10-CM | POA: Diagnosis present

## 2023-06-20 DIAGNOSIS — D696 Thrombocytopenia, unspecified: Secondary | ICD-10-CM | POA: Diagnosis present

## 2023-06-20 DIAGNOSIS — N2581 Secondary hyperparathyroidism of renal origin: Secondary | ICD-10-CM | POA: Diagnosis present

## 2023-06-20 DIAGNOSIS — I132 Hypertensive heart and chronic kidney disease with heart failure and with stage 5 chronic kidney disease, or end stage renal disease: Secondary | ICD-10-CM | POA: Diagnosis present

## 2023-06-20 DIAGNOSIS — I480 Paroxysmal atrial fibrillation: Secondary | ICD-10-CM | POA: Diagnosis present

## 2023-06-20 DIAGNOSIS — E162 Hypoglycemia, unspecified: Secondary | ICD-10-CM | POA: Diagnosis present

## 2023-06-20 DIAGNOSIS — L02612 Cutaneous abscess of left foot: Secondary | ICD-10-CM | POA: Diagnosis present

## 2023-06-20 DIAGNOSIS — Z8673 Personal history of transient ischemic attack (TIA), and cerebral infarction without residual deficits: Secondary | ICD-10-CM

## 2023-06-20 DIAGNOSIS — I96 Gangrene, not elsewhere classified: Secondary | ICD-10-CM

## 2023-06-20 DIAGNOSIS — N186 End stage renal disease: Secondary | ICD-10-CM | POA: Diagnosis present

## 2023-06-20 DIAGNOSIS — R54 Age-related physical debility: Secondary | ICD-10-CM | POA: Diagnosis present

## 2023-06-20 DIAGNOSIS — E1152 Type 2 diabetes mellitus with diabetic peripheral angiopathy with gangrene: Secondary | ICD-10-CM | POA: Diagnosis not present

## 2023-06-20 DIAGNOSIS — Z818 Family history of other mental and behavioral disorders: Secondary | ICD-10-CM

## 2023-06-20 DIAGNOSIS — E1169 Type 2 diabetes mellitus with other specified complication: Secondary | ICD-10-CM | POA: Diagnosis present

## 2023-06-20 DIAGNOSIS — Z7982 Long term (current) use of aspirin: Secondary | ICD-10-CM

## 2023-06-20 DIAGNOSIS — E11319 Type 2 diabetes mellitus with unspecified diabetic retinopathy without macular edema: Secondary | ICD-10-CM | POA: Diagnosis present

## 2023-06-20 DIAGNOSIS — L89620 Pressure ulcer of left heel, unstageable: Secondary | ICD-10-CM | POA: Diagnosis present

## 2023-06-20 DIAGNOSIS — I2489 Other forms of acute ischemic heart disease: Secondary | ICD-10-CM | POA: Diagnosis present

## 2023-06-20 DIAGNOSIS — M86672 Other chronic osteomyelitis, left ankle and foot: Secondary | ICD-10-CM | POA: Diagnosis present

## 2023-06-20 DIAGNOSIS — R627 Adult failure to thrive: Principal | ICD-10-CM | POA: Diagnosis present

## 2023-06-20 DIAGNOSIS — E1143 Type 2 diabetes mellitus with diabetic autonomic (poly)neuropathy: Secondary | ICD-10-CM | POA: Diagnosis present

## 2023-06-20 DIAGNOSIS — Z79899 Other long term (current) drug therapy: Secondary | ICD-10-CM

## 2023-06-20 DIAGNOSIS — G4700 Insomnia, unspecified: Secondary | ICD-10-CM | POA: Diagnosis present

## 2023-06-20 DIAGNOSIS — Z91158 Patient's noncompliance with renal dialysis for other reason: Secondary | ICD-10-CM

## 2023-06-20 DIAGNOSIS — Z89611 Acquired absence of right leg above knee: Secondary | ICD-10-CM

## 2023-06-20 DIAGNOSIS — R197 Diarrhea, unspecified: Secondary | ICD-10-CM | POA: Diagnosis present

## 2023-06-20 DIAGNOSIS — I252 Old myocardial infarction: Secondary | ICD-10-CM

## 2023-06-20 DIAGNOSIS — E11649 Type 2 diabetes mellitus with hypoglycemia without coma: Secondary | ICD-10-CM | POA: Diagnosis present

## 2023-06-20 DIAGNOSIS — Z823 Family history of stroke: Secondary | ICD-10-CM

## 2023-06-20 DIAGNOSIS — E11621 Type 2 diabetes mellitus with foot ulcer: Secondary | ICD-10-CM | POA: Diagnosis present

## 2023-06-20 DIAGNOSIS — Z89511 Acquired absence of right leg below knee: Secondary | ICD-10-CM

## 2023-06-20 DIAGNOSIS — Z833 Family history of diabetes mellitus: Secondary | ICD-10-CM

## 2023-06-20 DIAGNOSIS — E8721 Acute metabolic acidosis: Secondary | ICD-10-CM | POA: Diagnosis present

## 2023-06-20 DIAGNOSIS — A0472 Enterocolitis due to Clostridium difficile, not specified as recurrent: Secondary | ICD-10-CM | POA: Diagnosis present

## 2023-06-20 DIAGNOSIS — I2510 Atherosclerotic heart disease of native coronary artery without angina pectoris: Secondary | ICD-10-CM | POA: Diagnosis present

## 2023-06-20 DIAGNOSIS — Z66 Do not resuscitate: Secondary | ICD-10-CM | POA: Diagnosis present

## 2023-06-20 DIAGNOSIS — B9689 Other specified bacterial agents as the cause of diseases classified elsewhere: Secondary | ICD-10-CM | POA: Diagnosis present

## 2023-06-20 DIAGNOSIS — Z86718 Personal history of other venous thrombosis and embolism: Secondary | ICD-10-CM

## 2023-06-20 DIAGNOSIS — N401 Enlarged prostate with lower urinary tract symptoms: Secondary | ICD-10-CM | POA: Diagnosis present

## 2023-06-20 DIAGNOSIS — F32A Depression, unspecified: Secondary | ICD-10-CM | POA: Diagnosis present

## 2023-06-20 DIAGNOSIS — Z515 Encounter for palliative care: Secondary | ICD-10-CM

## 2023-06-20 DIAGNOSIS — Z8249 Family history of ischemic heart disease and other diseases of the circulatory system: Secondary | ICD-10-CM

## 2023-06-20 DIAGNOSIS — Z8674 Personal history of sudden cardiac arrest: Secondary | ICD-10-CM

## 2023-06-20 DIAGNOSIS — Z993 Dependence on wheelchair: Secondary | ICD-10-CM

## 2023-06-20 DIAGNOSIS — G8929 Other chronic pain: Secondary | ICD-10-CM | POA: Diagnosis present

## 2023-06-20 DIAGNOSIS — K219 Gastro-esophageal reflux disease without esophagitis: Secondary | ICD-10-CM | POA: Diagnosis present

## 2023-06-20 DIAGNOSIS — Z9861 Coronary angioplasty status: Secondary | ICD-10-CM

## 2023-06-20 DIAGNOSIS — R55 Syncope and collapse: Secondary | ICD-10-CM | POA: Diagnosis not present

## 2023-06-20 DIAGNOSIS — Z7901 Long term (current) use of anticoagulants: Secondary | ICD-10-CM

## 2023-06-20 DIAGNOSIS — E440 Moderate protein-calorie malnutrition: Secondary | ICD-10-CM | POA: Diagnosis present

## 2023-06-20 DIAGNOSIS — G546 Phantom limb syndrome with pain: Secondary | ICD-10-CM | POA: Diagnosis not present

## 2023-06-20 DIAGNOSIS — Z888 Allergy status to other drugs, medicaments and biological substances status: Secondary | ICD-10-CM

## 2023-06-20 DIAGNOSIS — Z681 Body mass index (BMI) 19 or less, adult: Secondary | ICD-10-CM

## 2023-06-20 DIAGNOSIS — L97429 Non-pressure chronic ulcer of left heel and midfoot with unspecified severity: Secondary | ICD-10-CM | POA: Diagnosis present

## 2023-06-20 DIAGNOSIS — I5032 Chronic diastolic (congestive) heart failure: Secondary | ICD-10-CM | POA: Diagnosis present

## 2023-06-20 DIAGNOSIS — E875 Hyperkalemia: Secondary | ICD-10-CM | POA: Diagnosis present

## 2023-06-20 DIAGNOSIS — G40909 Epilepsy, unspecified, not intractable, without status epilepticus: Secondary | ICD-10-CM | POA: Diagnosis present

## 2023-06-20 DIAGNOSIS — Z841 Family history of disorders of kidney and ureter: Secondary | ICD-10-CM

## 2023-06-20 DIAGNOSIS — Z992 Dependence on renal dialysis: Secondary | ICD-10-CM

## 2023-06-20 DIAGNOSIS — Z87442 Personal history of urinary calculi: Secondary | ICD-10-CM

## 2023-06-20 DIAGNOSIS — I5A Non-ischemic myocardial injury (non-traumatic): Secondary | ICD-10-CM | POA: Diagnosis present

## 2023-06-20 DIAGNOSIS — E1151 Type 2 diabetes mellitus with diabetic peripheral angiopathy without gangrene: Secondary | ICD-10-CM | POA: Diagnosis present

## 2023-06-20 DIAGNOSIS — R531 Weakness: Secondary | ICD-10-CM

## 2023-06-20 DIAGNOSIS — R630 Anorexia: Secondary | ICD-10-CM

## 2023-06-20 LAB — CBC WITH DIFFERENTIAL/PLATELET
Abs Immature Granulocytes: 0.04 10*3/uL (ref 0.00–0.07)
Basophils Absolute: 0.1 10*3/uL (ref 0.0–0.1)
Basophils Relative: 1 %
Eosinophils Absolute: 0.8 10*3/uL — ABNORMAL HIGH (ref 0.0–0.5)
Eosinophils Relative: 8 %
HCT: 26.1 % — ABNORMAL LOW (ref 39.0–52.0)
Hemoglobin: 7.5 g/dL — ABNORMAL LOW (ref 13.0–17.0)
Immature Granulocytes: 0 %
Lymphocytes Relative: 13 %
Lymphs Abs: 1.4 10*3/uL (ref 0.7–4.0)
MCH: 21.2 pg — ABNORMAL LOW (ref 26.0–34.0)
MCHC: 28.7 g/dL — ABNORMAL LOW (ref 30.0–36.0)
MCV: 73.7 fL — ABNORMAL LOW (ref 80.0–100.0)
Monocytes Absolute: 0.8 10*3/uL (ref 0.1–1.0)
Monocytes Relative: 8 %
Neutro Abs: 7.1 10*3/uL (ref 1.7–7.7)
Neutrophils Relative %: 70 %
Platelets: 102 10*3/uL — ABNORMAL LOW (ref 150–400)
RBC: 3.54 MIL/uL — ABNORMAL LOW (ref 4.22–5.81)
RDW: 24.2 % — ABNORMAL HIGH (ref 11.5–15.5)
WBC: 10.2 10*3/uL (ref 4.0–10.5)
nRBC: 0 % (ref 0.0–0.2)

## 2023-06-20 LAB — CBG MONITORING, ED: Glucose-Capillary: 113 mg/dL — ABNORMAL HIGH (ref 70–99)

## 2023-06-20 LAB — TROPONIN I (HIGH SENSITIVITY)
Troponin I (High Sensitivity): 28 ng/L — ABNORMAL HIGH (ref <18)
Troponin I (High Sensitivity): 30 ng/L — ABNORMAL HIGH (ref <18)

## 2023-06-20 LAB — COMPREHENSIVE METABOLIC PANEL WITH GFR
ALT: 12 U/L (ref 0–44)
AST: 9 U/L — ABNORMAL LOW (ref 15–41)
Albumin: 2.5 g/dL — ABNORMAL LOW (ref 3.5–5.0)
Alkaline Phosphatase: 74 U/L (ref 38–126)
Anion gap: 13 (ref 5–15)
BUN: 75 mg/dL — ABNORMAL HIGH (ref 6–20)
CO2: 19 mmol/L — ABNORMAL LOW (ref 22–32)
Calcium: 8.7 mg/dL — ABNORMAL LOW (ref 8.9–10.3)
Chloride: 109 mmol/L (ref 98–111)
Creatinine, Ser: 11.16 mg/dL — ABNORMAL HIGH (ref 0.61–1.24)
GFR, Estimated: 5 mL/min — ABNORMAL LOW (ref >60)
Glucose, Bld: 96 mg/dL (ref 70–99)
Potassium: 4.6 mmol/L (ref 3.5–5.1)
Sodium: 141 mmol/L (ref 135–145)
Total Bilirubin: 0.8 mg/dL (ref 0.0–1.2)
Total Protein: 6 g/dL — ABNORMAL LOW (ref 6.5–8.1)

## 2023-06-20 LAB — LACTIC ACID, PLASMA
Lactic Acid, Venous: 0.7 mmol/L (ref 0.5–1.9)
Lactic Acid, Venous: 0.7 mmol/L (ref 0.5–1.9)

## 2023-06-20 LAB — POC OCCULT BLOOD, ED

## 2023-06-20 MED ORDER — ASPIRIN 81 MG PO TBEC
81.0000 mg | DELAYED_RELEASE_TABLET | Freq: Every day | ORAL | Status: DC
Start: 2023-06-21 — End: 2023-07-08
  Administered 2023-06-21 – 2023-07-08 (×16): 81 mg via ORAL
  Filled 2023-06-20 (×17): qty 1

## 2023-06-20 MED ORDER — AMIODARONE HCL 200 MG PO TABS
200.0000 mg | ORAL_TABLET | Freq: Every day | ORAL | Status: DC
Start: 2023-06-20 — End: 2023-07-08
  Administered 2023-06-20 – 2023-07-08 (×17): 200 mg via ORAL
  Filled 2023-06-20 (×18): qty 1

## 2023-06-20 MED ORDER — CARVEDILOL 12.5 MG PO TABS
12.5000 mg | ORAL_TABLET | Freq: Two times a day (BID) | ORAL | Status: DC
  Administered 2023-06-20: 12.5 mg via ORAL
  Filled 2023-06-20: qty 1

## 2023-06-20 MED ORDER — ATORVASTATIN CALCIUM 80 MG PO TABS
80.0000 mg | ORAL_TABLET | Freq: Every day | ORAL | Status: DC
  Administered 2023-06-20 – 2023-07-07 (×18): 80 mg via ORAL
  Filled 2023-06-20 (×4): qty 1
  Filled 2023-06-20: qty 2
  Filled 2023-06-20 (×13): qty 1

## 2023-06-20 NOTE — ED Provider Notes (Signed)
 [place] [facility]MENT AT [facility] Provider Note   CSN: [id number] Arrival date & time: 06/20/23  1614     History {Add pertinent medical, surgical, social history, OB history to HPI:1} Chief Complaint  Patient presents with   Failure To Thrive    [name]. is a 53 y.o. male with past medical history of T2DM, diastolic heart failure, IDA, hyperparathyroidism, A-fib, ESRD on HD (TThSat), seizures (on keppra ) presents emergency department via EMS for evaluation of hypoglycemia and generalized weakness.  [name] was being evaluated at his PCP office when [name] suddenly felt unwell and dropped his head. They noted CBG of 60 in office. Since then, [name] has been generally weak.  Wife reports that [name] has not eaten more than a small meal a day in 5 days and has been caring for a wound to his left lower heel that has been draining. She reports that [name] had a decreased appetite as [name] does not wish to have a BM 2/2 pain from dumping syndrome  Was recently evaluated on 06/16/2023 for HTN, hypoglycemia, seizure-like activity, decreased appetite, sacral wound  Has been complaint with dialysis appointments with last one on 06/18/23. Has not had meds today.  HPI     Home Medications Prior to Admission medications   Medication Sig Start Date End Date Taking? Authorizing Provider  acetaminophen  (TYLENOL ) 325 MG tablet Take 2 tablets (650 mg total) by mouth every 6 (six) hours as needed for mild pain (pain score 1-3) or fever (or Fever >/= 101). 05/26/23   Colin Dawley, MD  Amino Acids-Protein Hydrolys (PRO-STAT AWC) LIQD Take 30 mLs by mouth every morning. 06/10/23   [provider]  amiodarone  (PACERONE ) 200 MG tablet Take 1 tablet (200 mg total) by mouth daily. 05/26/23 05/26/24  Colin Dawley, MD  ammonium lactate  (AMLACTIN DAILY) 12 % lotion Apply topically. 02/25/23   [provider]  ascorbic acid  (VITAMIN C ) 500 MG tablet Take 1,000 mg by mouth daily.     [provider]  aspirin  EC 81 MG tablet Take 1 tablet (81 mg total) by mouth daily with breakfast. Swallow whole. 05/26/23   Colin Dawley, MD  atorvastatin  (LIPITOR ) 80 MG tablet Take 1 tablet (80 mg total) by mouth at bedtime. 05/26/23   Colin Dawley, MD  brimonidine  (ALPHAGAN ) 0.2 % ophthalmic solution Place 1 drop into the right eye 3 (three) times daily. Patient taking differently: Place 1 drop into the right eye 2 (two) times daily. 05/30/23   Ninetta Basket, MD  calcitRIOL  (ROCALTROL ) 0.25 MCG capsule Take 9 capsules (2.25 mcg total) by mouth every Tuesday, Thursday, and Saturday at 6 PM. Patient taking differently: Take 0.25 mcg by mouth in the morning, at noon, and at bedtime. Every Tues-Thur-Sat 02/24/23   DanfordWillis Harter, MD  calcium  acetate (PHOSLO ) 667 MG capsule Take 2 capsules (1,334 mg total) by mouth 3 (three) times daily with meals. 05/04/21   Love, Renay Carota, PA-C  carvedilol  (COREG ) 12.5 MG tablet Take 1 tablet (12.5 mg total) by mouth 2 (two) times daily. 05/26/23   Colin Dawley, MD  cinacalcet  (SENSIPAR ) 30 MG tablet Take 2 tablets (60 mg total) by mouth every Tuesday, Thursday, and Saturday at 6 PM. 02/24/23   Danford, Willis Harter, MD  dorzolamide -timolol  (COSOPT ) 2-0.5 % ophthalmic solution Place 1 drop into the right eye 2 (two) times daily. 11/15/22 11/15/23  [provider]  levETIRAcetam  (KEPPRA ) 500 MG tablet Take 1 tablet (500 mg total) by mouth  4 (four) times a week. On Sunday, Monday, Wednesday and Friday, all non-dialysis days 05/26/23   Colin Dawley, MD  levETIRAcetam  (KEPPRA ) 750 MG tablet Take 1 tablet (750 mg total) by mouth 3 (three) times a week. On Tuesday Thursday and Saturday, on dialysis days 05/27/23   Colin Dawley, MD  lidocaine -prilocaine  (EMLA ) cream Apply 1 Application topically 3 (three) times a week. 12/24/21   [provider]  loperamide  (IMODIUM ) 2 MG capsule Take 1 capsule (2 mg total) by mouth as needed for  diarrhea or loose stools. 02/23/23   Danford, Willis Harter, MD  multivitamin (RENA-VIT) TABS tablet Take 1 tablet by mouth at bedtime. Patient not taking: Reported on 06/16/2023 04/12/23   Rai, Hurman Maiden, MD  mupirocin  ointment (BACTROBAN ) 2 % Apply topically. 08/20/21   [provider]  nutrition supplement, JUVEN, (JUVEN) PACK Take 1 packet by mouth 2 (two) times daily between meals. Patient not taking: Reported on 06/16/2023 02/23/23   Ephriam Hashimoto, MD  Nutritional Supplements (,FEEDING SUPPLEMENT, PROSOURCE PLUS) liquid Take 30 mLs by mouth 3 (three) times daily between meals. Patient not taking: Reported on 06/16/2023 02/23/23   Ephriam Hashimoto, MD  ondansetron  (ZOFRAN ) 4 MG tablet Take 1 tablet (4 mg total) by mouth every 6 (six) hours as needed for nausea or vomiting. 03/17/23   Adel Aden, PA-C  oxyCODONE  (OXY IR/ROXICODONE ) 5 MG immediate release tablet Take 1 tablet (5 mg total) by mouth every 4 (four) hours as needed for moderate pain (pain score 4-6). 05/26/23   Colin Dawley, MD  polyethylene glycol (MIRALAX  / GLYCOLAX ) 17 g packet Take 17 g by mouth daily. 05/26/23   Colin Dawley, MD  Zinc  Oxide (DESITIN MAXIMUM STRENGTH) 40 % PSTE Apply 1 Application topically 2 (two) times daily as needed. 06/16/23   Teddi Favors, DO      Allergies    Promethazine     Review of Systems   Review of Systems  Constitutional:  Negative for chills, fatigue and fever.  Respiratory:  Negative for cough, chest tightness, shortness of breath and wheezing.   Cardiovascular:  Negative for chest pain and palpitations.  Gastrointestinal:  Negative for abdominal pain, constipation, diarrhea, nausea and vomiting.  Neurological:  Negative for dizziness, seizures, weakness, light-headedness, numbness and headaches.    Physical Exam Updated Vital Signs Ht 6\' 3"  (1.905 m)   Wt 75 kg   BMI 20.67 kg/m  Physical Exam Vitals and nursing note reviewed.  Constitutional:       General: [name] is not in acute distress.    Appearance: Normal appearance.  HENT:     Head: Normocephalic and atraumatic.  Eyes:     Conjunctiva/sclera: Conjunctivae normal.  Cardiovascular:     Rate and Rhythm: Normal rate.     Pulses:          Dorsalis pedis pulses are 2+ on the left side.  Pulmonary:     Effort: Pulmonary effort is normal. No respiratory distress.     Breath sounds: Normal breath sounds.  Chest:     Chest wall: No tenderness.  Musculoskeletal:       Arms:     Left lower leg: No edema.     Comments: Fistula with palpable thrill on left upper arm.  Previous fistula on right upper arm no thrill palpated.      Right Lower Extremity: Right leg is amputated below knee.  Feet:     Comments: Amputation of left great toe Skin:  Capillary Refill: Capillary refill takes less than 2 seconds.     Coloration: Skin is not jaundiced or pale.     Comments: Macerated skin to left heel. Mild bleeding following removal of bandage  Neurological:     Mental Status: [name] is alert and oriented to person, place, and time. Mental status is at baseline.     ED Results / Procedures / Treatments   Labs (all labs ordered are listed, but only abnormal results are displayed) Labs Reviewed  CBG MONITORING, ED - Abnormal; Notable for the following components:      Result Value   Glucose-Capillary 113 (*)    All other components within normal limits    EKG None  Radiology No results found.  Procedures Procedures  {Document cardiac monitor, telemetry assessment procedure when appropriate:1}  Medications Ordered in ED Medications - No data to display  ED Course/ Medical Decision Making/ A&P   {   Click here for ABCD2, HEART and other calculatorsREFRESH Note before signing :1}                              Medical Decision Making Amount and/or Complexity of Data Reviewed Labs: ordered. Radiology: ordered.  Risk OTC drugs. Prescription drug management.   Patient  presents to the ED for concern of generalized weakness, failure to thrive, this involves an extensive number of treatment options, and is a complaint that carries with it a high risk of complications and morbidity.  The differential diagnosis includes ***   Co morbidities that complicate the patient evaluation  ***   Additional history obtained:  Additional history obtained from *** {Blank multiple:19196::"EMS","Family","Nursing","Outside Medical Records","Past Admission"}   External records from outside source obtained and reviewed including triage RN note and wife   Lab Tests:  I Ordered, and personally interpreted labs.  The pertinent results include:   CBG 113 Hemoglobin 7.5 (baseline: 7-8.4 over past month) Platelets 102 (has been 145-281 over past 4 weeks). Troponin 28 (baseline 29-44 1 year ago likely secondary to ESRD) Fecal occult negative BUN 75 (baseline 67-1 4:23 weeks) Creatinine 11.16 (ESRD on HD)   Imaging Studies ordered:  I ordered imaging studies including left foot xr  I independently visualized and interpreted imaging which showed  No osteomyelitis I agree with the radiologist interpretation   Cardiac Monitoring:  The patient was maintained on a cardiac monitor.  I personally viewed and interpreted the cardiac monitored which showed an underlying rhythm of: NSR with new inverted T waves in inferior lateral leads   Medicines ordered and prescription drug management:  I ordered home medication including amiodarone , aspirin , atorvastatin , carvedilol  as patient became hypertensive in emergency department had not taken his home medication Reevaluation of the patient after these medicines showed that the patient improved I have reviewed the patients home medicines and have made adjustments as needed     Consultations Obtained:  I requested consultation with the hospitalist,  and discussed lab and imaging findings as well as pertinent plan - they  recommend: ***   Problem List / ED Course:  Failure to thrive Generalized weakness Decreased appetite  Could be related to decreased appetite as [name] has not been eating since [name] has pain with defecation Is very fatigued/somnolent on exam. Based on chart review, it appeared [name] presented similarly on 06/16/23. Had negative CT head then Feel that patient would benefit from admission as [name] has not really improved since last ED visit.  Reevaluation:  After the interventions noted above, I reevaluated the patient and found that they have :stayed the same     Dispostion:  After consideration of the diagnostic results and the patients response to treatment, I feel that the patent would benefit from admission.    {Document critical care time when appropriate:1} {Document review of labs and clinical decision tools ie heart score, Chads2Vasc2 etc:1}  {Document your independent review of radiology images, and any outside records:1} {Document your discussion with family members, caretakers, and with consultants:1} {Document social determinants of health affecting pt's care:1} {Document your decision making why or why not admission, treatments were needed:1} Final Clinical Impression(s) / ED Diagnoses Final diagnoses:  None    Rx / DC Orders ED Discharge Orders     None

## 2023-06-20 NOTE — ED Triage Notes (Signed)
 Ems called out to pcp office for hypoglycemia. Blood sugar was 60, per wife pt has not eaten in 2 days and has wound to sacrum.

## 2023-06-20 NOTE — ED Notes (Signed)
 Pt to xray at this time.

## 2023-06-21 ENCOUNTER — Other Ambulatory Visit (HOSPITAL_COMMUNITY): Payer: Self-pay

## 2023-06-21 ENCOUNTER — Encounter (HOSPITAL_COMMUNITY): Payer: Self-pay | Admitting: Internal Medicine

## 2023-06-21 ENCOUNTER — Telehealth (HOSPITAL_COMMUNITY): Payer: Self-pay | Admitting: Pharmacy Technician

## 2023-06-21 DIAGNOSIS — R55 Syncope and collapse: Secondary | ICD-10-CM | POA: Diagnosis present

## 2023-06-21 DIAGNOSIS — L97424 Non-pressure chronic ulcer of left heel and midfoot with necrosis of bone: Secondary | ICD-10-CM | POA: Diagnosis not present

## 2023-06-21 DIAGNOSIS — I5A Non-ischemic myocardial injury (non-traumatic): Secondary | ICD-10-CM | POA: Diagnosis present

## 2023-06-21 DIAGNOSIS — Z66 Do not resuscitate: Secondary | ICD-10-CM | POA: Diagnosis present

## 2023-06-21 DIAGNOSIS — Z7189 Other specified counseling: Secondary | ICD-10-CM | POA: Diagnosis not present

## 2023-06-21 DIAGNOSIS — A0472 Enterocolitis due to Clostridium difficile, not specified as recurrent: Secondary | ICD-10-CM | POA: Diagnosis present

## 2023-06-21 DIAGNOSIS — E1152 Type 2 diabetes mellitus with diabetic peripheral angiopathy with gangrene: Secondary | ICD-10-CM | POA: Diagnosis present

## 2023-06-21 DIAGNOSIS — L97429 Non-pressure chronic ulcer of left heel and midfoot with unspecified severity: Secondary | ICD-10-CM | POA: Diagnosis present

## 2023-06-21 DIAGNOSIS — Z515 Encounter for palliative care: Secondary | ICD-10-CM | POA: Diagnosis not present

## 2023-06-21 DIAGNOSIS — E11621 Type 2 diabetes mellitus with foot ulcer: Secondary | ICD-10-CM | POA: Diagnosis present

## 2023-06-21 DIAGNOSIS — Z681 Body mass index (BMI) 19 or less, adult: Secondary | ICD-10-CM | POA: Diagnosis not present

## 2023-06-21 DIAGNOSIS — L89620 Pressure ulcer of left heel, unstageable: Secondary | ICD-10-CM | POA: Diagnosis present

## 2023-06-21 DIAGNOSIS — I132 Hypertensive heart and chronic kidney disease with heart failure and with stage 5 chronic kidney disease, or end stage renal disease: Secondary | ICD-10-CM | POA: Diagnosis present

## 2023-06-21 DIAGNOSIS — I2489 Other forms of acute ischemic heart disease: Secondary | ICD-10-CM | POA: Diagnosis present

## 2023-06-21 DIAGNOSIS — R627 Adult failure to thrive: Principal | ICD-10-CM | POA: Diagnosis present

## 2023-06-21 DIAGNOSIS — D696 Thrombocytopenia, unspecified: Secondary | ICD-10-CM | POA: Diagnosis present

## 2023-06-21 DIAGNOSIS — D631 Anemia in chronic kidney disease: Secondary | ICD-10-CM | POA: Diagnosis present

## 2023-06-21 DIAGNOSIS — E8721 Acute metabolic acidosis: Secondary | ICD-10-CM | POA: Diagnosis present

## 2023-06-21 DIAGNOSIS — I96 Gangrene, not elsewhere classified: Secondary | ICD-10-CM | POA: Diagnosis not present

## 2023-06-21 DIAGNOSIS — E1169 Type 2 diabetes mellitus with other specified complication: Secondary | ICD-10-CM | POA: Diagnosis present

## 2023-06-21 DIAGNOSIS — E440 Moderate protein-calorie malnutrition: Secondary | ICD-10-CM | POA: Diagnosis present

## 2023-06-21 DIAGNOSIS — E162 Hypoglycemia, unspecified: Secondary | ICD-10-CM | POA: Diagnosis not present

## 2023-06-21 DIAGNOSIS — F32A Depression, unspecified: Secondary | ICD-10-CM | POA: Diagnosis present

## 2023-06-21 DIAGNOSIS — T8131XA Disruption of external operation (surgical) wound, not elsewhere classified, initial encounter: Secondary | ICD-10-CM | POA: Diagnosis not present

## 2023-06-21 DIAGNOSIS — I5032 Chronic diastolic (congestive) heart failure: Secondary | ICD-10-CM | POA: Diagnosis present

## 2023-06-21 DIAGNOSIS — L02612 Cutaneous abscess of left foot: Secondary | ICD-10-CM | POA: Diagnosis present

## 2023-06-21 DIAGNOSIS — M86672 Other chronic osteomyelitis, left ankle and foot: Secondary | ICD-10-CM | POA: Diagnosis present

## 2023-06-21 DIAGNOSIS — I739 Peripheral vascular disease, unspecified: Secondary | ICD-10-CM | POA: Diagnosis not present

## 2023-06-21 DIAGNOSIS — N2581 Secondary hyperparathyroidism of renal origin: Secondary | ICD-10-CM | POA: Diagnosis present

## 2023-06-21 DIAGNOSIS — Z992 Dependence on renal dialysis: Secondary | ICD-10-CM | POA: Diagnosis not present

## 2023-06-21 DIAGNOSIS — E11649 Type 2 diabetes mellitus with hypoglycemia without coma: Secondary | ICD-10-CM | POA: Diagnosis present

## 2023-06-21 DIAGNOSIS — N186 End stage renal disease: Secondary | ICD-10-CM | POA: Diagnosis present

## 2023-06-21 DIAGNOSIS — I2510 Atherosclerotic heart disease of native coronary artery without angina pectoris: Secondary | ICD-10-CM | POA: Diagnosis not present

## 2023-06-21 DIAGNOSIS — I120 Hypertensive chronic kidney disease with stage 5 chronic kidney disease or end stage renal disease: Secondary | ICD-10-CM | POA: Diagnosis not present

## 2023-06-21 DIAGNOSIS — G40909 Epilepsy, unspecified, not intractable, without status epilepticus: Secondary | ICD-10-CM | POA: Diagnosis present

## 2023-06-21 DIAGNOSIS — R630 Anorexia: Secondary | ICD-10-CM | POA: Diagnosis not present

## 2023-06-21 LAB — BASIC METABOLIC PANEL WITH GFR
Anion gap: 13 (ref 5–15)
BUN: 78 mg/dL — ABNORMAL HIGH (ref 6–20)
CO2: 17 mmol/L — ABNORMAL LOW (ref 22–32)
Calcium: 8.6 mg/dL — ABNORMAL LOW (ref 8.9–10.3)
Chloride: 110 mmol/L (ref 98–111)
Creatinine, Ser: 11.55 mg/dL — ABNORMAL HIGH (ref 0.61–1.24)
GFR, Estimated: 5 mL/min — ABNORMAL LOW (ref >60)
Glucose, Bld: 133 mg/dL — ABNORMAL HIGH (ref 70–99)
Potassium: 4.7 mmol/L (ref 3.5–5.1)
Sodium: 140 mmol/L (ref 135–145)

## 2023-06-21 LAB — PHOSPHORUS: Phosphorus: 8.5 mg/dL — ABNORMAL HIGH (ref 2.5–4.6)

## 2023-06-21 LAB — C DIFFICILE QUICK SCREEN W PCR REFLEX
C Diff antigen: POSITIVE — AB
C Diff toxin: NEGATIVE

## 2023-06-21 LAB — MAGNESIUM: Magnesium: 2.3 mg/dL (ref 1.7–2.4)

## 2023-06-21 LAB — CBC
HCT: 27.3 % — ABNORMAL LOW (ref 39.0–52.0)
Hemoglobin: 7.8 g/dL — ABNORMAL LOW (ref 13.0–17.0)
MCH: 21.3 pg — ABNORMAL LOW (ref 26.0–34.0)
MCHC: 28.6 g/dL — ABNORMAL LOW (ref 30.0–36.0)
MCV: 74.4 fL — ABNORMAL LOW (ref 80.0–100.0)
Platelets: 109 10*3/uL — ABNORMAL LOW (ref 150–400)
RBC: 3.67 MIL/uL — ABNORMAL LOW (ref 4.22–5.81)
RDW: 24.2 % — ABNORMAL HIGH (ref 11.5–15.5)
WBC: 8.4 10*3/uL (ref 4.0–10.5)
nRBC: 0 % (ref 0.0–0.2)

## 2023-06-21 LAB — HEPATITIS B SURFACE ANTIGEN: Hepatitis B Surface Ag: NONREACTIVE

## 2023-06-21 LAB — GLUCOSE, CAPILLARY: Glucose-Capillary: 79 mg/dL (ref 70–99)

## 2023-06-21 LAB — CLOSTRIDIUM DIFFICILE BY PCR, REFLEXED: Toxigenic C. Difficile by PCR: POSITIVE — AB

## 2023-06-21 LAB — CBG MONITORING, ED: Glucose-Capillary: 132 mg/dL — ABNORMAL HIGH (ref 70–99)

## 2023-06-21 LAB — C-REACTIVE PROTEIN: CRP: 1.1 mg/dL — ABNORMAL HIGH (ref <1.0)

## 2023-06-21 LAB — SEDIMENTATION RATE: Sed Rate: 35 mm/h — ABNORMAL HIGH (ref 0–16)

## 2023-06-21 MED ORDER — MELATONIN 3 MG PO TABS
6.0000 mg | ORAL_TABLET | Freq: Every evening | ORAL | Status: DC | PRN
  Administered 2023-06-24 – 2023-07-05 (×5): 6 mg via ORAL
  Filled 2023-06-21 (×4): qty 2

## 2023-06-21 MED ORDER — SODIUM CHLORIDE 0.9 % IV SOLN
2.0000 g | INTRAVENOUS | Status: DC
  Administered 2023-06-22 – 2023-06-25 (×4): 2 g via INTRAVENOUS
  Filled 2023-06-21 (×4): qty 20

## 2023-06-21 MED ORDER — VANCOMYCIN HCL 750 MG/150ML IV SOLN
750.0000 mg | INTRAVENOUS | Status: DC
  Administered 2023-06-21 – 2023-06-23 (×2): 750 mg via INTRAVENOUS
  Filled 2023-06-21 (×4): qty 150

## 2023-06-21 MED ORDER — POLYETHYLENE GLYCOL 3350 17 G PO PACK: 17.0000 g | PACK | Freq: Every day | ORAL | Status: DC | PRN

## 2023-06-21 MED ORDER — NEPRO/CARBSTEADY PO LIQD
237.0000 mL | Freq: Three times a day (TID) | ORAL | Status: DC
  Administered 2023-06-22 – 2023-07-08 (×28): 237 mL via ORAL

## 2023-06-21 MED ORDER — CALCIUM ACETATE (PHOS BINDER) 667 MG PO CAPS
1334.0000 mg | ORAL_CAPSULE | Freq: Three times a day (TID) | ORAL | Status: DC
  Administered 2023-06-22 – 2023-07-08 (×37): 1334 mg via ORAL
  Filled 2023-06-21 (×40): qty 2

## 2023-06-21 MED ORDER — RENA-VITE PO TABS
1.0000 | ORAL_TABLET | Freq: Every day | ORAL | Status: DC
  Administered 2023-06-21 – 2023-07-07 (×16): 1 via ORAL
  Filled 2023-06-21 (×17): qty 1

## 2023-06-21 MED ORDER — LEVETIRACETAM 500 MG PO TABS
500.0000 mg | ORAL_TABLET | ORAL | Status: DC
  Administered 2023-06-21 – 2023-07-07 (×9): 500 mg via ORAL
  Filled 2023-06-21 (×9): qty 1

## 2023-06-21 MED ORDER — CHLORHEXIDINE GLUCONATE CLOTH 2 % EX PADS: 6.0000 | MEDICATED_PAD | Freq: Every day | CUTANEOUS | Status: DC

## 2023-06-21 MED ORDER — VANCOMYCIN HCL 1500 MG/300ML IV SOLN
1500.0000 mg | Freq: Once | INTRAVENOUS | Status: AC
  Administered 2023-06-21: 1500 mg via INTRAVENOUS
  Filled 2023-06-21: qty 300

## 2023-06-21 MED ORDER — SODIUM CHLORIDE 0.9 % IV SOLN
1.0000 g | INTRAVENOUS | Status: DC
  Administered 2023-06-21: 1 g via INTRAVENOUS
  Filled 2023-06-21: qty 10

## 2023-06-21 MED ORDER — HYDRALAZINE HCL 20 MG/ML IJ SOLN
10.0000 mg | INTRAMUSCULAR | Status: DC | PRN
  Administered 2023-06-21 – 2023-06-27 (×3): 10 mg via INTRAVENOUS
  Filled 2023-06-21 (×2): qty 1

## 2023-06-21 MED ORDER — ACETAMINOPHEN 500 MG PO TABS
1000.0000 mg | ORAL_TABLET | Freq: Four times a day (QID) | ORAL | Status: DC | PRN
  Administered 2023-06-22 – 2023-07-04 (×8): 1000 mg via ORAL
  Filled 2023-06-21 (×8): qty 2

## 2023-06-21 MED ORDER — ZINC OXIDE 40 % EX OINT
TOPICAL_OINTMENT | Freq: Two times a day (BID) | CUTANEOUS | Status: DC
  Administered 2023-06-24: 1 via TOPICAL
  Filled 2023-06-21 (×6): qty 57

## 2023-06-21 MED ORDER — CARVEDILOL 25 MG PO TABS
25.0000 mg | ORAL_TABLET | Freq: Two times a day (BID) | ORAL | Status: DC
  Administered 2023-06-21 – 2023-07-06 (×28): 25 mg via ORAL
  Filled 2023-06-21: qty 1
  Filled 2023-06-21: qty 2
  Filled 2023-06-21 (×31): qty 1

## 2023-06-21 MED ORDER — CALCIUM ACETATE (PHOS BINDER) 667 MG PO TABS
1334.0000 mg | ORAL_TABLET | Freq: Three times a day (TID) | ORAL | Status: DC
Start: 2023-06-21 — End: 2023-06-21
  Filled 2023-06-21 (×8): qty 2

## 2023-06-21 MED ORDER — ONDANSETRON HCL 4 MG/2ML IJ SOLN: 4.0000 mg | Freq: Four times a day (QID) | INTRAMUSCULAR | Status: DC | PRN

## 2023-06-21 MED ORDER — SODIUM CHLORIDE 0.9% FLUSH
3.0000 mL | Freq: Two times a day (BID) | INTRAVENOUS | Status: DC
  Administered 2023-06-21 – 2023-07-06 (×24): 3 mL via INTRAVENOUS

## 2023-06-21 MED ORDER — VANCOMYCIN VARIABLE DOSE PER UNSTABLE RENAL FUNCTION (PHARMACIST DOSING): Status: DC

## 2023-06-21 MED ORDER — RISAQUAD PO CAPS
2.0000 | ORAL_CAPSULE | Freq: Three times a day (TID) | ORAL | Status: DC
  Administered 2023-06-21 – 2023-07-08 (×49): 2 via ORAL
  Filled 2023-06-21 (×49): qty 2

## 2023-06-21 MED ORDER — FIDAXOMICIN 200 MG PO TABS: 200.0000 mg | ORAL_TABLET | Freq: Two times a day (BID) | ORAL | Status: DC

## 2023-06-21 MED ORDER — HYDRALAZINE HCL 20 MG/ML IJ SOLN: 10.0000 mg | Freq: Once | INTRAMUSCULAR | Status: DC

## 2023-06-21 MED ORDER — HEPARIN SODIUM (PORCINE) 5000 UNIT/ML IJ SOLN
5000.0000 [IU] | Freq: Three times a day (TID) | INTRAMUSCULAR | Status: DC
  Administered 2023-06-21 – 2023-07-06 (×40): 5000 [IU] via SUBCUTANEOUS
  Filled 2023-06-21 (×44): qty 1

## 2023-06-21 MED ORDER — LEVETIRACETAM 500 MG PO TABS
750.0000 mg | ORAL_TABLET | ORAL | Status: DC
  Administered 2023-06-22 – 2023-07-08 (×7): 750 mg via ORAL
  Filled 2023-06-21 (×10): qty 1

## 2023-06-21 MED ORDER — VANCOMYCIN HCL 125 MG PO CAPS
125.0000 mg | ORAL_CAPSULE | Freq: Four times a day (QID) | ORAL | Status: AC
  Administered 2023-06-21 – 2023-07-01 (×36): 125 mg via ORAL
  Filled 2023-06-21 (×41): qty 1

## 2023-06-21 MED ORDER — METRONIDAZOLE 500 MG/100ML IV SOLN
500.0000 mg | Freq: Two times a day (BID) | INTRAVENOUS | Status: DC
  Administered 2023-06-21 – 2023-06-25 (×9): 500 mg via INTRAVENOUS
  Filled 2023-06-21 (×10): qty 100

## 2023-06-21 NOTE — ED Notes (Signed)
 Patient removed from bedpan and cleaned of incontinence; Gown changed. Patient having a BM immediately after cleaning

## 2023-06-21 NOTE — Progress Notes (Signed)
 [facility] PROGRESS NOTE  [name]. (DOB: Dec 29, 1970) OZH:[id number] PCP: Ayesha Lente, FNP Outpatient Specialists: Dr. Julio Ohm  Brief Narrative: [name]. is a 53 y.o. 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 Spouse noted increasing purulence from left heel wound. He was admitted this morning at [facility] to [facility] for formal evaluation by orthopedics.  Subjective: He reports increasing pain in the left foot for the past couple days, increasing generalized weakness. Asking for bedpan. No abdominal pain or fever.  Objective: BP (!) 169/75   Pulse 66   Temp 97.9 F (36.6 C) (Oral)   Resp 18   Ht 6\' 3"  (1.905 m)   Wt 75 kg   SpO2 99%   BMI 20.67 kg/m   Gen: Frail male in no acute distress HEENT: Left eye surgically absent. Pulm: Nonlabored, clear  CV: RRR, no edema, L DP pulse poorly palpable GI: Soft, NT, ND, +BS  Neuro: Alert and oriented.   Ext: Warm, dry R AKA site. LUE +T/B. L heel with necrotic ulceration and sanguinous discharge.    Assessment & Plan: H&P, orders reviewed. Patient understands plan and is amenable. Confirmed consults with orthopedics and nephrology. Will continue care per Dr. Amy Kansky' admission note/orders.   Wynetta Heckle, MD [facility] [email or web address] 06/21/2023, 9:15 AM

## 2023-06-21 NOTE — ED Notes (Signed)
 Pt states he does not produce urine. Unable to collect urine sample.

## 2023-06-21 NOTE — H&P (Addendum)
 History and Physical    [name]. ZOX:[id number] DOB: 1970-07-28 DOA: 06/20/2023  PCP: [name] Lente, FNP   Patient coming from: Home   Chief Complaint:  Chief Complaint  Patient presents with   Failure To Thrive    HPI:  [name]. is a 53 y.o. 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  Per report of his wife at the bedside patient slumped over and had intermittent episodes of alertness/gasping.  Blood glucose at the time was within normal limit but had subsequent hypoglycemia later.  Patient does not take any diabetic medications.  Has decreased intake, only eating about 1 meal a day.  Denies any chest pain, palpitations, preceding prodrome.  Has been lightheaded.  Overall patient notes that he feels increasingly weak.  Wife notes that she has been changing dressing for his left heel wound and on Saturday 4/26 noted that a scab came off and began draining a significant amount of purulent drainage.  Shows me a picture on phone of gross purulence from the wound.  No fevers, chills.  The right AKA stump has healed well and no issues on this side.   Review of Systems:  ROS complete and negative except as marked above   Allergies  Allergen Reactions   Promethazine  Diarrhea and Other (See Comments)    Muscle cramps, cramping "all over"    Prior to Admission medications   Medication Sig Start Date End Date Taking? Authorizing Provider  acetaminophen  (TYLENOL ) 325 MG tablet Take 2 tablets (650 mg total) by mouth every 6 (six)  hours as needed for mild pain (pain score 1-3) or fever (or Fever >/= 101). 05/26/23   Colin Dawley, MD  Amino Acids-Protein Hydrolys (PRO-STAT AWC) LIQD Take 30 mLs by mouth every morning. 06/10/23   [provider]  amiodarone  (PACERONE ) 200 MG tablet Take 1 tablet (200 mg total) by mouth daily. 05/26/23 05/26/24  Colin Dawley, MD  ammonium lactate  (AMLACTIN DAILY) 12 % lotion Apply topically. 02/25/23   [provider]  ascorbic acid  (VITAMIN C ) 500 MG tablet Take 1,000 mg by mouth daily.    [provider]  aspirin  EC 81 MG tablet Take 1 tablet (81 mg total) by mouth daily with breakfast. Swallow whole. 05/26/23   Colin Dawley, MD  atorvastatin  (LIPITOR ) 80 MG tablet Take 1 tablet (80 mg total) by mouth at bedtime. 05/26/23   Colin Dawley, MD  brimonidine  (ALPHAGAN ) 0.2 % ophthalmic solution Place 1 drop into the right eye 3 (three) times daily. Patient taking differently: Place 1 drop into the right eye 2 (two) times daily. 05/30/23   Ninetta Basket, MD  calcitRIOL  (ROCALTROL ) 0.25 MCG capsule Take 9 capsules (2.25 mcg total) by mouth every Tuesday, Thursday, and Saturday at 6 PM. Patient taking differently: Take 0.25 mcg by mouth in the morning, at noon, and at bedtime. Every Tues-Thur-Sat 02/24/23   DanfordWillis Harter, MD  calcium  acetate (PHOSLO ) 667 MG tablet Take 1,334 mg by mouth 3 (three) times daily. 06/18/23   [provider]  carvedilol  (COREG ) 12.5 MG tablet  Take 1 tablet (12.5 mg total) by mouth 2 (two) times daily. 05/26/23   Colin Dawley, MD  cinacalcet  (SENSIPAR ) 30 MG tablet Take 2 tablets (60 mg total) by mouth every Tuesday, Thursday, and Saturday at 6 PM. 02/24/23   Danford, Willis Harter, MD  dorzolamide -timolol  (COSOPT ) 2-0.5 % ophthalmic solution Place 1 drop into the right eye 2 (two) times daily. 11/15/22 11/15/23  [provider]  levETIRAcetam  (KEPPRA ) 500 MG tablet Take 1 tablet (500 mg total) by mouth 4 (four) times a  week. On Sunday, Monday, Wednesday and Friday, all non-dialysis days 05/26/23   Colin Dawley, MD  levETIRAcetam  (KEPPRA ) 750 MG tablet Take 1 tablet (750 mg total) by mouth 3 (three) times a week. On Tuesday Thursday and Saturday, on dialysis days 05/27/23   Colin Dawley, MD  lidocaine -prilocaine  (EMLA ) cream Apply 1 Application topically 3 (three) times a week. 12/24/21   [provider]  loperamide  (IMODIUM ) 2 MG capsule Take 1 capsule (2 mg total) by mouth as needed for diarrhea or loose stools. 02/23/23   Danford, Willis Harter, MD  multivitamin (RENA-VIT) TABS tablet Take 1 tablet by mouth at bedtime. Patient not taking: Reported on 06/16/2023 04/12/23   Rai, Hurman Maiden, MD  mupirocin  ointment (BACTROBAN ) 2 % Apply topically. 08/20/21   [provider]  nutrition supplement, JUVEN, (JUVEN) PACK Take 1 packet by mouth 2 (two) times daily between meals. Patient not taking: Reported on 06/16/2023 02/23/23   Ephriam Hashimoto, MD  Nutritional Supplements (,FEEDING SUPPLEMENT, PROSOURCE PLUS) liquid Take 30 mLs by mouth 3 (three) times daily between meals. Patient not taking: Reported on 06/16/2023 02/23/23   Ephriam Hashimoto, MD  ondansetron  (ZOFRAN ) 4 MG tablet Take 1 tablet (4 mg total) by mouth every 6 (six) hours as needed for nausea or vomiting. 03/17/23   Adel Aden, PA-C  oxyCODONE  (OXY IR/ROXICODONE ) 5 MG immediate release tablet Take 1 tablet (5 mg total) by mouth every 4 (four) hours as needed for moderate pain (pain score 4-6). 05/26/23   Colin Dawley, MD  polyethylene glycol (MIRALAX  / GLYCOLAX ) 17 g packet Take 17 g by mouth daily. 05/26/23   Colin Dawley, MD  Zinc  Oxide (DESITIN MAXIMUM STRENGTH) 40 % PSTE Apply 1 Application topically 2 (two) times daily as needed. 06/16/23   Teddi Favors, DO    Past Medical History:  Diagnosis Date   Acute Bil  CVAs of Bil Frontal amd Rt Parietal  in Watershed Distribution 04/21/2021   Patchy acute  cortically-based infarcts within the bilateral high  frontal lobes and right parietal lobe (predominantly in a watershed  distribution).   Subcentimeter acute infarct within the callosal splenium on the  left.     Acute bilateral cerebral infarction in a watershed distribution [facility]) 04/21/2021   a.) MRI brain 04/21/2021 --> patchy ACUTE cortically-based infarcts within the bilateral high frontal lobes and right parietal lobe   Acute cerebral infarction (HCC) 02/13/2021   a.) MRI brain 02/13/2021: acute LEFT hippocampal infarct   Acute cerebral infarction (HCC) 04/21/2021   a.) MRI brain 04/21/2021: subcentimeter ACUTE infarct within the callosal splenium on the LEFT   Acute osteomyelitis of toe of right foot with gangrene (HCC) 02/26/2022   Anaphylactic reaction due to adverse effect of correct drug or medicament properly administered, initial encounter 05/08/2019   Anemia of chronic renal failure    Anxiety    Aortic dilatation (HCC) 11/25/2020   a.) TTE 11/25/2020: Ao root measured 38 mm. b.) TTE 04/22/2021:  Ao root measured 44 mm; c. 11/2021 Echo: Ao root 40mm.   Atrial fibrillation (HCC)    a.) CHA2DS2-VASc = 6 (CHF, HTN, CVA x2, prior MI, T2DM). b.) rate/rhythm maintained on oral amiodarone  + carvedilol ; chronic OAC/AP therapy discontinued following ICH   Benign prostatic hyperplasia with lower urinary tract symptoms 11/07/2019   BPH (benign prostatic hyperplasia)    Cardiac arrest (HCC) 07/26/2021   a.) in setting of hyperkalemia, NSTEMI, and seizure following missed HD; pulseless and apneic --> required 1 round of CPR prior to ROSC   Cellulitis    Cerebral hemorrhage (HCC) 11/21/2021   a.) CT head 11/21/2021 --> 1.4 x 1.5 x 3.1 cm ACUTE hemorrhage within the inferior aspect of the fourth ventricle, and extending inferiorly through the foramen of foramen of [place] --> occurred in setting of HTN emergency and DOAC/APT-->eliquis /plavix  d/c'd.   Cerebral microvascular disease    Chronic  diarrhea    Chronic heart failure with preserved ejection fraction (HFpEF) (HCC) 07/19/2017   a.) 06/2017 Echo: EF 75%; b.) 11/2020 Echo: EF 55-60%; c.) 04/2021 Echo: EF 55-60%; d.) 11/2021 Echo: EF 65-70%, GrII DD, nl RV fxn, sev dil LA, large circumferential pericardial eff w/o tamponade, triv MR, AoV sclerosis. Ao root 40mm; e.) TTE 02/28/2025: EF 60-65%, sev LVH, mod posterior LV effusion, AoV sclerosis   Chronic pain of both ankles    Coronary artery disease 02/26/2020   a. 02/2020 Ant STEMI/PCI: LM nl, LAD 59m (3.5x30 Resolute Onyx), RI nl, LCX min irregs, RCA min irregs. EF 65%.   DDD (degenerative disc disease), cervical    Diabetes mellitus, type II (HCC)    Diabetic neuropathy (HCC)    Esophagitis    ESRD (end stage renal disease) on dialysis (HCC)    a.) [facility]; T-Th-Sat   Frequent falls    Gait instability    Gastritis    Gastroparesis    GERD (gastroesophageal reflux disease)    H/O enucleation of left eyeball    Heart palpitations    History of kidney stones    HLD (hyperlipidemia)    Hypertension    Insomnia    a.) uses melatonin PRN   Long term current use of amiodarone     Nausea and vomiting in adult    recurrent   NSTEMI (non-ST elevated myocardial infarction) (HCC) 07/26/2021   a.) hyperkalemic from missed HD; seizures; decompensated to cardiac arrest and required CPR x 1 round prior to ROSC.   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  Added automatically from request for surgery [id number]     Osteomyelitis [facility])    Pericardial effusion    a. 11/2021 Echo: EF 65-70%, GrII DD, nl RV fxn, sev dil LA, large circumferential pericardial eff w/o tamponade, triv MR, AoV sclerosis. Ao root 40mm.   Seizure (HCC)    a.) last 07/26/2021 in setting of missed HD --> hyperkalemic at 6.5 --> pulseless/apneic and required CPR; discharged home on levetiracetam .   ST elevation myocardial infarction  (STEMI) of anterior wall (HCC) 02/26/2020   a.) LHC/PCI 02/26/2020 --> EF 65%; LVEDP 11 mmHg; 90% mLAD (3.5 x 20 mm Resolute Onyx DES x 1)    Past Surgical History:  Procedure Laterality Date   A/V FISTULAGRAM Left 08/31/2021   Procedure: A/V Fistulagram;  Surgeon: Celso College, MD;  Location: [name] INVASIVE CV LAB;  Service: Cardiovascular;  Laterality: Left;   A/V FISTULAGRAM Left 01/25/2022   Procedure: A/V  Fistulagram;  Surgeon: Celso College, MD;  Location: [name] INVASIVE CV LAB;  Service: Cardiovascular;  Laterality: Left;   A/V FISTULAGRAM Right 10/18/2022   Procedure: A/V Fistulagram;  Surgeon: Celso College, MD;  Location: [name] INVASIVE CV LAB;  Service: Cardiovascular;  Laterality: Right;   ABDOMINAL AORTOGRAM W/LOWER EXTREMITY Bilateral 02/08/2023   Procedure: ABDOMINAL AORTOGRAM W/LOWER EXTREMITY;  Surgeon: Philipp Brawn, MD;  Location: [facility] INVASIVE CV LAB;  Service: Vascular;  Laterality: Bilateral;   AMPUTATION Left 03/16/2016   Procedure: AMPUTATION DIGIT LEFT HALLUX;  Surgeon: Charity Conch, DPM;  Location: [facility] OR;  Service: Podiatry;  Laterality: Left;  can start around 5    AMPUTATION Right 02/09/2021   Procedure: AMPUTATION DIGIT;  Surgeon: Marilyn Shropshire, MD;  Location: [facility] OR;  Service: Orthopedics;  Laterality: Right;   AMPUTATION Right 02/04/2023   Procedure: AMPUTATION 4TH AND 5TH METATARSAL;  Surgeon: Evertt Hoe, DPM;  Location: [facility] OR;  Service: Orthopedics/Podiatry;  Laterality: Right;  I&D, partial ray resection, abx beads   AMPUTATION Right 02/09/2023   Procedure: RIGHT BELOW KNEE AMPUTATION;  Surgeon: Timothy Ford, MD;  Location: [facility] OR;  Service: Orthopedics;  Laterality: Right;   AMPUTATION Right 04/06/2023   Procedure: RIGHT ABOVE KNEE AMPUTATION;  Surgeon: Timothy Ford, MD;  Location: [facility] OR;  Service: Orthopedics;  Laterality: Right;   AMPUTATION Right 05/11/2023   Procedure: RIGHT ABOVE KNEE AMPUTATION;  Surgeon: Timothy Ford, MD;  Location:  [facility] OR;  Service: Orthopedics;  Laterality: Right;  REVISION RIGHT ABOVE KNEE AMPUTATION   AMPUTATION TOE Right 02/28/2022   Procedure: AMPUTATION TOE;  Surgeon: Jennefer Moats, DPM;  Location: [name] ORS;  Service: Podiatry;  Laterality: Right;  right fifth toe   ARTHROSCOPIC REPAIR ACL Left    AV FISTULA PLACEMENT Right 05/31/2019   Procedure: Brachiocephalic AV fistula creation;  Surgeon: Celso College, MD;  Location: [name] ORS;  Service: Vascular;  Laterality: Right;   AV FISTULA PLACEMENT Left 08/13/2021   Procedure: ARTERIOVENOUS (AV) FISTULA CREATION (BRACHIALCEPHALIC);  Surgeon: Celso College, MD;  Location: [name] ORS;  Service: Vascular;  Laterality: Left;   CHEST TUBE INSERTION Right 05/06/2023   Procedure: CHEST TUBE INSERTION;  Surgeon: [name] Nigh, MD;  Location: [facility] ENDOSCOPY;  Service: Pulmonary;  Laterality: Right;   COLONOSCOPY WITH PROPOFOL  N/A 10/28/2015   Procedure: COLONOSCOPY WITH PROPOFOL ;  Surgeon: Deveron Fly, MD;  Location: [facility] ENDOSCOPY;  Service: Endoscopy;  Laterality: N/A;   COLONOSCOPY WITH PROPOFOL  N/A 10/29/2015   Procedure: COLONOSCOPY WITH PROPOFOL ;  Surgeon: Deveron Fly, MD;  Location: [facility] ENDOSCOPY;  Service: Endoscopy;  Laterality: N/A;   COLONOSCOPY WITH PROPOFOL  N/A 01/27/2023   Procedure: COLONOSCOPY WITH PROPOFOL ;  Surgeon: Hargis Lias, MD;  Location: [facility] ENDO SUITE;  Service: Endoscopy;  Laterality: N/A;  12:30pm, asa 3   CORONARY ULTRASOUND/IVUS N/A 02/26/2020   Procedure: Intravascular Ultrasound/IVUS;  Surgeon: Lucendia Rusk, MD;  Location: [facility] INVASIVE CV LAB;  Service: Cardiovascular;  Laterality: N/A;   CORONARY/GRAFT ACUTE MI REVASCULARIZATION N/A 02/26/2020   Procedure: Coronary/Graft Acute MI Revascularization;  Surgeon: Lucendia Rusk, MD;  Location: [facility] INVASIVE CV LAB;  Service: Cardiovascular;  Laterality: N/A;   DIALYSIS/PERMA CATHETER INSERTION Right 04/26/2019   Perm Cath    DIALYSIS/PERMA CATHETER INSERTION N/A  04/26/2019   Procedure: DIALYSIS/PERMA CATHETER INSERTION;  Surgeon: Celso College, MD;  Location: [name] INVASIVE CV LAB;  Service: Cardiovascular;  Laterality: N/A;   DIALYSIS/PERMA CATHETER INSERTION N/A 05/25/2021  Procedure: DIALYSIS/PERMA CATHETER INSERTION;  Surgeon: Celso College, MD;  Location: [name] INVASIVE CV LAB;  Service: Cardiovascular;  Laterality: N/A;   DIALYSIS/PERMA CATHETER INSERTION N/A 08/31/2021   Procedure: DIALYSIS/PERMA CATHETER INSERTION;  Surgeon: Celso College, MD;  Location: [name] INVASIVE CV LAB;  Service: Cardiovascular;  Laterality: N/A;   DIALYSIS/PERMA CATHETER REMOVAL N/A 08/15/2019   Procedure: DIALYSIS/PERMA CATHETER REMOVAL;  Surgeon: Jackquelyn Mass, MD;  Location: [name] INVASIVE CV LAB;  Service: Cardiovascular;  Laterality: N/A;   DIALYSIS/PERMA CATHETER REMOVAL N/A 03/15/2022   Procedure: DIALYSIS/PERMA CATHETER REMOVAL;  Surgeon: Celso College, MD;  Location: [name] INVASIVE CV LAB;  Service: Cardiovascular;  Laterality: N/A;   EMBOLIZATION (CATH LAB) Right 02/06/2021   Procedure: EMBOLIZATION;  Surgeon: Celso College, MD;  Location: [name] INVASIVE CV LAB;  Service: Cardiovascular;  Laterality: Right;  Right Upper Extremity Dialysis Access, Permcath Placement   ESOPHAGOGASTRODUODENOSCOPY (EGD) WITH PROPOFOL  N/A 12/27/2017   Procedure: ESOPHAGOGASTRODUODENOSCOPY (EGD) WITH PROPOFOL ;  Surgeon: Toledo, Alphonsus Jeans, MD;  Location: [name] ENDOSCOPY;  Service: Gastroenterology;  Laterality: N/A;   EYE SURGERY     IR THORACENTESIS ASP PLEURAL SPACE W/IMG GUIDE  05/05/2023   LEFT HEART CATH AND CORONARY ANGIOGRAPHY N/A 02/26/2020   Procedure: LEFT HEART CATH AND CORONARY ANGIOGRAPHY;  Surgeon: Lucendia Rusk, MD;  Location: [facility] INVASIVE CV LAB;  Service: Cardiovascular;  Laterality: N/A;   PROSTATE SURGERY  2016   REVISON OF ARTERIOVENOUS FISTULA Right 07/22/2022   Procedure: RESECTION OF ANEURYSMAL RIGHT ARM ARTERIOVENOUS FISTULA;  Surgeon: Celso College, MD;  Location:  [name] ORS;  Service: Vascular;  Laterality: Right;   TONSILECTOMY/ADENOIDECTOMY WITH MYRINGOTOMY     TONSILLECTOMY     UPPER EXTREMITY ANGIOGRAPHY Right 11/26/2020   Procedure: Upper Extremity Angiography;  Surgeon: Celso College, MD;  Location: [name] INVASIVE CV LAB;  Service: Cardiovascular;  Laterality: Right;   UPPER EXTREMITY ANGIOGRAPHY Right 02/05/2021   Procedure: UPPER EXTREMITY ANGIOGRAPHY;  Surgeon: Celso College, MD;  Location: [name] INVASIVE CV LAB;  Service: Cardiovascular;  Laterality: Right;     reports that he has never smoked. He has never used smokeless tobacco. He reports that he does not drink alcohol  and does not use drugs.  Family History  Problem Relation Age of Onset   CAD Father    Stroke Father    Diabetes Mellitus II Mother    Kidney failure Mother    Schizophrenia Mother      Physical Exam: Vitals:   06/20/23 2230 06/20/23 2245 06/20/23 2330 06/21/23 0030  BP: (!) 207/87 (!) 208/88 (!) 200/78 (!) 155/76  Pulse: 63 63 61 70  Resp: 11 (!) 8 12 19   Temp:      TempSrc:      SpO2: 98% 98% 97% 97%  Weight:      Height:        Gen: Awake, alert, chronically ill-appearing CV: Regular, normal S1, S2, 1/6 SEM. LUE fistula with thrill. nonpalpable pedal pulse on the left. Resp: Normal WOB, CTAB  Abd: Flat, normoactive, nontender MSK: R AKA, no open wounds, well perfused. L heel with necrotic ulceration about 6-8 cm, at the proximal aspect closer to the heel there is an area of erythematous tissue, this is the area that was draining purulence from picture wife and shown me. Skin: See MSK for additional findings Neuro: Alert and interactive  Psych: Mood appears down/depressed   Data review:   Labs reviewed, notable for:   Lactate 0.7 Blood glucose 60 x  1, after this has been 80s to 110s Bicarb 19, no anion gap Creatinine 11 (ESRD)  High-sensitivity Trop 28 -> 30 Hemoglobin 7.5, microcytic Platelet 102 Positive schistocytes on smear  Micro:  Results  for orders placed or performed during the hospital encounter of 06/16/23  Resp panel by RT-PCR (RSV, Flu A&B, Covid) Anterior Nasal Swab     Status: None   Collection Time: 06/16/23  9:29 AM   Specimen: Anterior Nasal Swab  Result Value Ref Range Status   SARS Coronavirus 2 by RT PCR NEGATIVE NEGATIVE Final    Comment: (NOTE) SARS-CoV-2 target nucleic acids are NOT DETECTED.  The SARS-CoV-2 RNA is generally detectable in upper respiratory specimens during the acute phase of infection. The lowest concentration of SARS-CoV-2 viral copies this assay can detect is 138 copies/mL. A negative result does not preclude SARS-Cov-2 infection and should not be used as the sole basis for treatment or other patient management decisions. A negative result may occur with  improper specimen collection/handling, submission of specimen other than nasopharyngeal swab, presence of viral mutation(s) within the areas targeted by this assay, and inadequate number of viral copies(<138 copies/mL). A negative result must be combined with clinical observations, patient history, and epidemiological information. The expected result is Negative.  Fact Sheet for Patients:  BloggerCourse.com  Fact Sheet for Healthcare Providers:  SeriousBroker.it  This test is no t yet approved or cleared by the [place]  FDA and  has been authorized for detection and/or diagnosis of SARS-CoV-2 by FDA under an Emergency Use Authorization (EUA). This EUA will remain  in effect (meaning this test can be used) for the duration of the COVID-19 declaration under Section 564(b)(1) of the Act, 21 U.S.C.section 360bbb-3(b)(1), unless the authorization is terminated  or revoked sooner.       Influenza A by PCR NEGATIVE NEGATIVE Final   Influenza B by PCR NEGATIVE NEGATIVE Final    Comment: (NOTE) The Xpert Xpress SARS-CoV-2/FLU/RSV plus assay is intended as an aid in the diagnosis  of influenza from Nasopharyngeal swab specimens and should not be used as a sole basis for treatment. Nasal washings and aspirates are unacceptable for Xpert Xpress SARS-CoV-2/FLU/RSV testing.  Fact Sheet for Patients: BloggerCourse.com  Fact Sheet for Healthcare Providers: SeriousBroker.it  This test is not yet approved or cleared by the [place]  FDA and has been authorized for detection and/or diagnosis of SARS-CoV-2 by FDA under an Emergency Use Authorization (EUA). This EUA will remain in effect (meaning this test can be used) for the duration of the COVID-19 declaration under Section 564(b)(1) of the Act, 21 U.S.C. section 360bbb-3(b)(1), unless the authorization is terminated or revoked.     Resp Syncytial Virus by PCR NEGATIVE NEGATIVE Final    Comment: (NOTE) Fact Sheet for Patients: BloggerCourse.com  Fact Sheet for Healthcare Providers: SeriousBroker.it  This test is not yet approved or cleared by the [place]  FDA and has been authorized for detection and/or diagnosis of SARS-CoV-2 by FDA under an Emergency Use Authorization (EUA). This EUA will remain in effect (meaning this test can be used) for the duration of the COVID-19 declaration under Section 564(b)(1) of the Act, 21 U.S.C. section 360bbb-3(b)(1), unless the authorization is terminated or revoked.  Performed at [facility], [address], [place], [place] [address]     Imaging reviewed:  DG Foot Complete Left Result Date: 06/20/2023 CLINICAL DATA:  Wound check. Abscess. Wound to the bottom of the left foot. EXAM: LEFT FOOT - COMPLETE 3+ VIEW COMPARISON:  MRI left foot 04/04/2023. Left foot radiographs 05/25/2022 FINDINGS: Previous amputation of the left first toe at the metatarsal-phalangeal level. Postoperative changes versus resorption at the articular surface of the second metatarsal-phalangeal  joint. Similar appearance to previous study. Diffuse bone demineralization. Degenerative changes in the interphalangeal and intertarsal joints. No evidence of acute fracture or dislocation. No focal bone lesion or new bone erosion. Extensive vascular calcifications. No radiopaque soft tissue foreign bodies or soft tissue gas identified. IMPRESSION: 1. Chronic bone changes as discussed above. No acute displaced fractures identified. No acute bone erosion suggested. 2. Extensive soft tissue calcification. No radiopaque foreign bodies or soft tissue gas. Electronically Signed   By: Boyce Byes M.D.   On: 06/20/2023 18:31    EKG: Personally reviewed, sinus rhythm, degree AV block, likely LVH, ST depression and T wave inversion laterally, TWI inferiorly  ED Course:  Monitored in the ED with normal blood glucose   Assessment/Plan:  53 y.o. 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 Incidentally noted to have infected ischemic wound of the left foot.   Syncopal episode  Hypoglycemic episode  Reportedly slumped over and unconscious in PCP office. CBG 60 at the time; however per wife CBG was higher in 80s at time but then downtrended after the event. I do not see any documentation from clinic. Unclear if event was hypoglycemia related or other syncope. He is not on DM medications, does have poor oral intake. EKG is abnormal but lower suspicion for cardiac cause of syncope. Has had decreased intake + lightheadedness, despite ESRD may be slightly  volume down.  - Telemonitoring - Orthostatics - CBG every 6 hour  Infected ischemic ulcer of the left heel History of gangrenous wounds on the right and has ultimately had a right AKA.  On the left she has developed an ischemic wound over the heel which has been dry.  However over the past 3 days wife has noted now draining purulent material (she has picture of gross purulence from the proximal aspect of the wound on her phone).  Otherwise no systemic signs of infection.  WBC is 7.  -Attempted to contact Ortho care but not successful, have messaged PA Magnant re: orthopedic consult in the morning. Will admit to [facility] for ortho evaluation  -Has seen vascular surgery and 12/' 24 angiogram with three-vessel runoff to the foot but microvascular disease present with minimal pedal vessel filling.  -Started on vancomycin  pharmacy to dose, ceftriaxone  2 g IV every 24 hour, Flagyl  500 mg IV every 12 hours.  -Wound care consult  Failure to thrive Progressive weakness now wheelchair-bound and using a Hoyer; despite recent SNF stay. diminished oral intake only taking 1 meal per day. -Nutrition consult -PT/OT  Chronic myocardial injury No history of chest pain.  EKG does have ST depression and T wave inversion laterally, T wave inversion inferiorly.  High-sensitivity troponin low level elevated and flat 28 -> 30 which is consistent with his chronic troponin.  Likely in the setting of underlying CAD, ESRD -Continued management for his chronic CAD per below  Sacral wound  Wound care consult  Diarrhea  - GI pathogen, C diff testing. If neg can start on Loperamide   - May benefit from FMS due to his sacral wounds.   Chronic medical problems: ESRD,Tu/Th/Sat HD: Routine nephrology consult in the morning for HD (not contacted overnight) CAD history STEMI / Hx CVA: Continue home aspirin , atorvastatin  80 mg, Coreg  12.5 mg twice daily HFpEF: Without acute exacerbation.  Not on diuretics paroxysmal A-fib off  anticoagulation: Currently in sinus rhythm, not on anticoagulation.  Continue home amiodarone , coreg  per above.  Hx IPH: Noted, off AC.  Seizure disorder: Continue home Keppra  500 mg nondialysis days, 750 mg on HD days. DM type II diet controlled: Recent hypoglycemia, not on diabetic medications.  Every 6hr CBG checks, hypoglycemia protocol  Hypertension: Currently uncontrolled, continue home antihypertensives per above Chronic anemia: Hx anemia CKD, defer to nephro re: Epo Enucleation OS, hx DM retinopathy, glaucoma: Noted, ophtho f/u OP.   Body mass index is 20.67 kg/m.    DVT prophylaxis:  SQ Heparin  Code Status:  DNR/DNI(Do NOT Intubate); confirmed with patietn  Diet:  Diet Orders (From admission, onward)     Start     Ordered   06/21/23 0150  Diet renal with fluid restriction Fluid restriction: 1200 mL Fluid; Room service appropriate? Yes; Fluid consistency: Thin  Diet effective now       Question Answer Comment  Fluid restriction: 1200 mL Fluid   Room service appropriate? Yes   Fluid consistency: Thin      06/21/23 0157           Family Communication:  Yes discussed with wife at bedside   Consults:  None (attempted to contact ortho)   Admission status:   Inpatient, Telemetry bed -> [facility]   Severity of Illness: The appropriate patient status for this patient is INPATIENT. Inpatient status is judged to be reasonable and necessary in order to provide the required intensity of service to ensure the patient's safety. The patient's presenting symptoms, physical exam findings, and initial radiographic and laboratory data in the context of their chronic comorbidities is felt to place them at high risk for further clinical deterioration. Furthermore, it is not anticipated that the patient will be medically stable for discharge from the hospital within 2 midnights of admission.   * I certify that at the point of admission it is my clinical judgment that the patient will require  inpatient hospital care spanning beyond 2 midnights from the point of admission due to high intensity of service, high risk for further deterioration and high frequency of surveillance required.*   Arnulfo Larch, MD [facility]  How to contact the [name] Attending or Consulting provider 7A - 7P or covering provider during after hours 7P -7A, for this patient.  Check the care team in [facility] and look for a) attending/consulting [name] provider listed and b) the [name] team listed Log into [email or web address] and use [facility]'s universal password to access. If you do not have the password, please contact the hospital operator. Locate the [name] provider you are looking for under [facility] and page to a number that you can be directly reached. If you still have difficulty reaching the provider, please page the [facility] (Director on Call) for the Hospitalists listed on amion for assistance.  06/21/2023, 2:11 AM

## 2023-06-21 NOTE — Progress Notes (Addendum)
 PROGRESS NOTE    Patient: [name] Backhaus Jr.                            PCP: Ayesha Lente, FNP                    DOB: 02-17-1971            DOA: 06/20/2023 ZOX:[id number]             DOS: 06/21/2023, 1:24 PM   LOS: 0 days   Date of Service: The patient was seen and examined on 06/21/2023  Subjective:     The patient was seen and examined upon transfer to [facility] from [facility].   Hemodynamically stable,  Hemodialysis today-it appears nephrology was consulted. Palliative care team and orthopedic team also has been consulted.  Addendum: Diarrhea-stool for C. difficile positive Started DIFICID    Brief Narrative:   [name] Schwiesow. is a 53 y.o. 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 Incidentally noted to have infected ischemic wound of the left foot.    Assessment & Plan:   Active Problems:   Syncope   Hypoglycemia   Ischemic ulcer of left heel (HCC)   Failure to thrive in adult   Syncopal episode  w Hypoglycemic episode  -Reportedly slumped over and unconscious in PCP office. CBG 60 at the time;  - He is not on DM medications, does have poor oral intake.  - decreased intake + lightheadedness, despite ESRD may be slightly volume down.  - Orthostatics - CBG every 6 hour   Infected ischemic ulcer of the left heel: -History of gangrenous wounds on the right and has ultimately had a right AKA.  - On the left she has developed an ischemic wound over the heel which has been dry.   -  Over the past 3 days wife has noted  now draining purulent material (she has picture of gross purulence from the proximal aspect of the wound on her phone).  Otherwise no systemic signs of infection.   -WBC is 7.  -Attempted to contact Ortho care but not successful, have messaged PA Magnant re: orthopedic consult in the morning.    Admitted and transferred to  [facility] for ortho evaluation   -Has seen vascular surgery and 12/' 24 angiogram with three-vessel runoff to the foot but microvascular disease present with minimal pedal vessel filling.   - Started on vancomycin  pharmacy to dose, ceftriaxone  2 g IV every 24 hour, Flagyl  500 mg IV every 12 hours.  -Wound care consult   Failure to thrive Progressive weakness now wheelchair-bound and using a Hoyer; despite recent SNF stay. diminished oral intake only taking 1 meal per day. -Nutrition consult -PT/OT -palliative consult   Chronic myocardial injury No history of chest pain.   -EKG does have ST depression and T wave inversion laterally, T wave inversion inferiorly.   - High-sensitivity troponin low level elevated and flat 28 -> 30 which is consistent with his chronic troponin.  Likely in the  setting of underlying CAD, ESRD -Continued management for his chronic CAD per below   Sacral wound Wound care consult   Diarrhea  - GI pathogen, C diff testing. If neg can start on Loperamide   - May benefit from FMS due to his sacral wounds.    Chronic medical problems:  ESRD,Tu/Th/Sat HD: Routine nephrology consult in the morning for HD (not contacted overnight)  CAD history STEMI / Hx CVA: Continue home aspirin , atorvastatin  80 mg,  Coreg  12.5 mg twice daily  HFpEF: Without acute exacerbation.  Not on diuretics  Paroxysmal A-fib off anticoagulation: Currently in sinus rhythm, not on anticoagulation.  Continue home amiodarone , coreg  per above.   Hx IPH: Noted, off AC.   Seizure disorder: Continue home Keppra  500 mg nondialysis days, 750 mg on HD days.  DM type II diet  controlled: Recent hypoglycemia, not on diabetic medications.  Every 6hr CBG checks, hypoglycemia protocol   Hypertension: Currently uncontrolled, continue home antihypertensives per above Chronic anemia: Hx anemia CKD, defer to nephro re: Epo  Enucleation OS, hx DM retinopathy, glaucoma: Noted, ophtho f/u OP.      Consults: Orthopedic team/nephrology ----------------------------------------------------------------------------------------------------------------------------------------------- Nutritional status:  The patient's BMI is: Body mass index is 20.67 kg/m. I agree with the assessment and plan as outlined below: Nutrition Status: Nutrition Problem: Increased nutrient needs Etiology: wound healing Signs/Symptoms: estimated needs Interventions: MVI, Nepro shake     Skin Assessment: I have examined the patient's skin and I agree with the wound assessment as performed by wound care team As outlined belowe: Pressure Injury 02/10/23 Sacrum Right;Upper;Mid Stage 2 -  Partial thickness loss of dermis presenting as a shallow open injury with a red, pink wound bed without slough. (Active)  02/10/23   Location: Sacrum  Location Orientation: Right;Upper;Mid  Staging: Stage 2 -  Partial thickness loss of dermis presenting as a shallow open injury with a red, pink wound bed without slough.  Wound Description (Comments):   Present on Admission:   Dressing Type Moisture barrier 05/16/23 2200     Pressure Injury 04/13/23 Heel Left Unstageable - Full thickness tissue loss in which the base of the injury is covered by slough (yellow, tan, gray, green or brown) and/or eschar (tan, brown or black) in the wound bed. (Active)  04/13/23 1148  Location: Heel  Location Orientation: Left  Staging: Unstageable - Full thickness tissue loss in which the base of the injury is covered by slough (yellow, tan, gray, green or brown) and/or eschar (tan, brown or black) in the wound bed.  Wound  Description (Comments):   Present on Admission: Yes  Dressing Type Other (Comment) (kerlix wrapped- pt refused dressing off at this time) 05/17/23 0754     ---------------------------------------------------------------------------------------------------------------------------------------------------- Cultures; Blood Cultures x 2 >>    ------------------------------------------------------------------------------------------------------------------------------------------------  DVT prophylaxis:  heparin  injection 5,000 Units Start: 06/21/23 0600   Code Status:   Code Status: Limited: Do not attempt resuscitation (DNR) -DNR-LIMITED -Do Not Intubate/DNI   Family Communication: No family member present at bedside- attempt will be made to update daily -Advance care planning has been discussed.   Admission status:   Status is: Inpatient Remains inpatient appropriate because: Needing IV antibiotics, evaluation by orthopedic team and nephrology.   Disposition: From  - home             Planning for discharge in 1-2 days: to   Procedures:   No admission procedures for hospital encounter.   Antimicrobials:  Anti-infectives (From admission, onward)    Start  Dose/Rate Route Frequency Ordered Stop   06/22/23 0200  cefTRIAXone  (ROCEPHIN ) 2 g in sodium chloride  0.9 % 100 mL IVPB        2 g 200 mL/hr over 30 Minutes Intravenous Every 24 hours 06/21/23 1134     06/21/23 1800  vancomycin  (VANCOREADY) IVPB 750 mg/150 mL        750 mg 150 mL/hr over 60 Minutes Intravenous Every T-Th-Sa (1800) 06/21/23 1134     06/21/23 1415  fidaxomicin (DIFICID) tablet 200 mg        200 mg Oral 2 times daily 06/21/23 1323 07/01/23 0959   06/21/23 0217  vancomycin  variable dose per unstable renal function (pharmacist dosing)         Does not apply See admin instructions 06/21/23 0217     06/21/23 0215  vancomycin  (VANCOREADY) IVPB 1500 mg/300 mL        1,500 mg 150 mL/hr over 120 Minutes  Intravenous  Once 06/21/23 0213 06/21/23 0610   06/21/23 0200  cefTRIAXone  (ROCEPHIN ) 1 g in sodium chloride  0.9 % 100 mL IVPB  Status:  Discontinued        1 g 200 mL/hr over 30 Minutes Intravenous Every 24 hours 06/21/23 0157 06/21/23 1134   06/21/23 0200  metroNIDAZOLE  (FLAGYL ) IVPB 500 mg        500 mg 100 mL/hr over 60 Minutes Intravenous Every 12 hours 06/21/23 0157          Medication:   acidophilus  2 capsule Oral TID   amiodarone   200 mg Oral Daily   aspirin  EC  81 mg Oral Q breakfast   atorvastatin   80 mg Oral QHS   calcium  acetate  1,334 mg Oral TID   carvedilol   25 mg Oral BID   feeding supplement (NEPRO CARB STEADY)  237 mL Oral TID BM   fidaxomicin  200 mg Oral BID   heparin   5,000 Units Subcutaneous Q8H   levETIRAcetam   500 mg Oral Once per day on Sunday Tuesday Thursday Saturday   [START ON 06/22/2023] levETIRAcetam   750 mg Oral Once per day on Monday Wednesday Friday   liver oil-zinc  oxide   Topical BID   multivitamin  1 tablet Oral QHS   sodium chloride  flush  3 mL Intravenous Q12H   vancomycin  variable dose per unstable renal function (pharmacist dosing)   Does not apply See admin instructions    acetaminophen , hydrALAZINE , melatonin, ondansetron  (ZOFRAN ) IV, polyethylene glycol   Objective:   Vitals:   06/21/23 1056 06/21/23 1058 06/21/23 1119 06/21/23 1300  BP: (!) 163/97 (!) 185/75  (!) 177/78  Pulse: 70 63 63 (!) 58  Resp:  18    Temp:    98.2 F (36.8 C)  TempSrc:    Oral  SpO2:  99% 100% 100%  Weight:      Height:        Intake/Output Summary (Last 24 hours) at 06/21/2023 1324 Last data filed at 06/21/2023 1056 Gross per 24 hour  Intake 3 ml  Output --  Net 3 ml   Filed Weights   06/20/23 1628  Weight: 75 kg     Physical examination:        General:  AAO x 3,  cooperative, no distress;   HEENT:  Normocephalic, PERRL, otherwise with in Normal limits   Neuro:  CNII-XII intact. , normal motor and sensation, reflexes intact    Lungs:   Clear to auscultation BL, Respirations unlabored,  No wheezes / crackles  Cardio:    S1/S2, RRR, No murmure, No Rubs or Gallops   Abdomen:  Soft, non-tender, bowel sounds active all four quadrants, no guarding or peritoneal signs.  Muscular  skeletal:  Right AKA, Limited exam -global generalized weaknesses - in bed, able to move all 4 extremities,   2+ pulses,  symmetric, No pitting edema  Skin:  Dry, warm to touch, negative for any Rashes, Multiple pressure ulcers as listed and described below Left heel necrosis, with eschars  Wounds: Please see nursing documentation  Pressure Injury 02/10/23 Sacrum Right;Upper;Mid Stage 2 -  Partial thickness loss of dermis presenting as a shallow open injury with a red, pink wound bed without slough. (Active)  02/10/23   Location: Sacrum  Location Orientation: Right;Upper;Mid  Staging: Stage 2 -  Partial thickness loss of dermis presenting as a shallow open injury with a red, pink wound bed without slough.  Wound Description (Comments):   Present on Admission:   Dressing Type Moisture barrier 05/16/23 2200     Pressure Injury 04/13/23 Heel Left Unstageable - Full thickness tissue loss in which the base of the injury is covered by slough (yellow, tan, gray, green or brown) and/or eschar (tan, brown or black) in the wound bed. (Active)  04/13/23 1148  Location: Heel  Location Orientation: Left  Staging: Unstageable - Full thickness tissue loss in which the base of the injury is covered by slough (yellow, tan, gray, green or brown) and/or eschar (tan, brown or black) in the wound bed.  Wound Description (Comments):   Present on Admission: Yes  Dressing Type Other (Comment) (kerlix wrapped- pt refused dressing off at this time) 05/17/23 0754           ------------------------------------------------------------------------------------------------------------------------------------------    LABs:     Latest Ref Rng & Units  06/21/2023    5:17 AM 06/20/2023    6:23 PM 06/16/2023    9:29 AM  CBC  WBC 4.0 - 10.5 K/uL 8.4  10.2  7.4   Hemoglobin 13.0 - 17.0 g/dL 7.8  7.5  8.4   Hematocrit 39.0 - 52.0 % 27.3  26.1  28.2   Platelets 150 - 400 K/uL 109  102  145       Latest Ref Rng & Units 06/21/2023    5:17 AM 06/20/2023    6:23 PM 06/16/2023    9:29 AM  CMP  Glucose 70 - 99 mg/dL 161  96  096   BUN 6 - 20 mg/dL 78  75  71   Creatinine 0.61 - 1.24 mg/dL 04.54  09.81  19.14   Sodium 135 - 145 mmol/L 140  141  142   Potassium 3.5 - 5.1 mmol/L 4.7  4.6  5.1   Chloride 98 - 111 mmol/L 110  109  106   CO2 22 - 32 mmol/L 17  19  21    Calcium  8.9 - 10.3 mg/dL 8.6  8.7  8.9   Total Protein 6.5 - 8.1 g/dL  6.0  6.2   Total Bilirubin 0.0 - 1.2 mg/dL  0.8  1.0   Alkaline Phos 38 - 126 U/L  74  85   AST 15 - 41 U/L  9  15   ALT 0 - 44 U/L  12  20        Micro Results Recent Results (from the past 240 hours)  Resp panel by RT-PCR (RSV, Flu A&B, Covid) Anterior Nasal Swab     Status: None   Collection Time: 06/16/23  9:29 AM   Specimen: Anterior Nasal Swab  Result Value Ref Range Status   SARS Coronavirus 2 by RT PCR NEGATIVE NEGATIVE Final    Comment: (NOTE) SARS-CoV-2 target nucleic acids are NOT DETECTED.  The SARS-CoV-2 RNA is generally detectable in upper respiratory specimens during the acute phase of infection. The lowest concentration of SARS-CoV-2 viral copies this assay can detect is 138 copies/mL. A negative result does not preclude SARS-Cov-2 infection and should not be used as the sole basis for treatment or other patient management decisions. A negative result may occur with  improper specimen collection/handling, submission of specimen other than nasopharyngeal swab, presence of viral mutation(s) within the areas targeted by this assay, and inadequate number of viral copies(<138 copies/mL). A negative result must be combined with clinical observations, patient history, and  epidemiological information. The expected result is Negative.  Fact Sheet for Patients:  BloggerCourse.com  Fact Sheet for Healthcare Providers:  SeriousBroker.it  This test is no t yet approved or cleared by the [place]  FDA and  has been authorized for detection and/or diagnosis of SARS-CoV-2 by FDA under an Emergency Use Authorization (EUA). This EUA will remain  in effect (meaning this test can be used) for the duration of the COVID-19 declaration under Section 564(b)(1) of the Act, 21 U.S.C.section 360bbb-3(b)(1), unless the authorization is terminated  or revoked sooner.       Influenza A by PCR NEGATIVE NEGATIVE Final   Influenza B by PCR NEGATIVE NEGATIVE Final    Comment: (NOTE) The Xpert Xpress SARS-CoV-2/FLU/RSV plus assay is intended as an aid in the diagnosis of influenza from Nasopharyngeal swab specimens and should not be used as a sole basis for treatment. Nasal washings and aspirates are unacceptable for Xpert Xpress SARS-CoV-2/FLU/RSV testing.  Fact Sheet for Patients: BloggerCourse.com  Fact Sheet for Healthcare Providers: SeriousBroker.it  This test is not yet approved or cleared by the [place]  FDA and has been authorized for detection and/or diagnosis of SARS-CoV-2 by FDA under an Emergency Use Authorization (EUA). This EUA will remain in effect (meaning this test can be used) for the duration of the COVID-19 declaration under Section 564(b)(1) of the Act, 21 U.S.C. section 360bbb-3(b)(1), unless the authorization is terminated or revoked.     Resp Syncytial Virus by PCR NEGATIVE NEGATIVE Final    Comment: (NOTE) Fact Sheet for Patients: BloggerCourse.com  Fact Sheet for Healthcare Providers: SeriousBroker.it  This test is not yet approved or cleared by the [place]  FDA and has been  authorized for detection and/or diagnosis of SARS-CoV-2 by FDA under an Emergency Use Authorization (EUA). This EUA will remain in effect (meaning this test can be used) for the duration of the COVID-19 declaration under Section 564(b)(1) of the Act, 21 U.S.C. section 360bbb-3(b)(1), unless the authorization is terminated or revoked.  Performed at [facility], [address] [place], [place] [address]   C Difficile Quick Screen w PCR reflex     Status: Abnormal   Collection Time: 06/21/23  3:56 AM   Specimen: STOOL  Result Value Ref Range Status   C Diff antigen POSITIVE (A) NEGATIVE Final   C Diff toxin NEGATIVE NEGATIVE Final   C Diff interpretation Results are indeterminate. See PCR results.  Final    Comment: Performed at [facility], [address] [place], [place] [address]  C. Diff by PCR, Reflexed     Status: Abnormal   Collection Time: 06/21/23  3:56 AM  Result Value Ref Range Status  Toxigenic C. Difficile by PCR POSITIVE (A) NEGATIVE Final    Comment: Positive for toxigenic C. difficile with little to no toxin production. Only treat if clinical presentation suggests symptomatic illness. Performed at [facility], [address] [place], [place] [address]     Radiology Reports DG Foot Complete Left Result Date: 06/20/2023 CLINICAL DATA:  Wound check. Abscess. Wound to the bottom of the left foot. EXAM: LEFT FOOT - COMPLETE 3+ VIEW COMPARISON:  MRI left foot 04/04/2023. Left foot radiographs 05/25/2022 FINDINGS: Previous amputation of the left first toe at the metatarsal-phalangeal level. Postoperative changes versus resorption at the articular surface of the second metatarsal-phalangeal joint. Similar appearance to previous study. Diffuse bone demineralization. Degenerative changes in the interphalangeal and intertarsal joints. No evidence of acute fracture or dislocation. No focal bone lesion or new bone erosion. Extensive vascular calcifications. No radiopaque soft  tissue foreign bodies or soft tissue gas identified. IMPRESSION: 1. Chronic bone changes as discussed above. No acute displaced fractures identified. No acute bone erosion suggested. 2. Extensive soft tissue calcification. No radiopaque foreign bodies or soft tissue gas. Electronically Signed   By: Boyce Byes M.D.   On: 06/20/2023 18:31    SIGNED: Bobbetta Burnet, MD, FHM. FAAFP. Arlin Benes - [facility] Time spent - 35 min.  In seeing, evaluating and examining the patient. Reviewing medical records, labs, drawn plan of care. [facility],  Pager (please use [email or web address] to page/ text) Please use Epic Secure Chat for non-urgent communication (7AM-7PM)  If 7PM-7AM, please contact night-coverage [email or web address], 06/21/2023, 1:24 PM

## 2023-06-21 NOTE — Progress Notes (Addendum)
 1530- Took over pt care from [place], [place]. Shelagh Derrick, RN stated that she completed the assessment, added my assessment in chart.

## 2023-06-21 NOTE — Progress Notes (Signed)
 Pharmacy Antibiotic Note  [name]. is a 53 y.o. male admitted on 06/20/2023 with concern for necrotic foot ulcer.  PMH for ESRD on HD TThS. Pharmacy has been consulted for vancomycin  dosing.  Plan: Continue Vancomycin  750mg  IV every T,TH,Sat after HD Vancomycin  dosing per levels as indicated F/u HD schedule and infectious work up   Height: 6\' 3"  (190.5 cm) Weight: 75 kg (165 lb 5.5 oz) IBW/kg (Calculated) : 84.5  Temp (24hrs), Avg:98 F (36.7 C), Min:97.7 F (36.5 C), Max:98.2 F (36.8 C)  Recent Labs  Lab 06/16/23 0929 06/20/23 1823 06/20/23 2008 06/21/23 0517  WBC 7.4 10.2  --  8.4  CREATININE 10.29* 11.16*  --  11.55*  LATICACIDVEN  --  0.7 0.7  --     Estimated Creatinine Clearance: 7.7 mL/min (A) (by C-G formula based on SCr of 11.55 mg/dL (H)).    Allergies  Allergen Reactions   Promethazine  Diarrhea and Other (See Comments)    Muscle cramps, cramping "all over"    Antimicrobials this admission: Ceftriaxone  4/29 Flagyl  4/29>> Vancomycin  4/29>>  Thank you for allowing pharmacy to be a part of this patient's care.  Angelamarie Avakian, BS Pharm D, BCPS Clinical Pharmacist 06/21/2023 11:19 AM

## 2023-06-21 NOTE — Progress Notes (Signed)
 Pharmacy Antibiotic Note  [name]. is a 53 y.o. male admitted on 06/20/2023 with concern for necrotic foot ulcer.  PMH for ESRD on HD TThS. Pharmacy has been consulted for vancomycin  dosing.  Plan: Vancomycin  1500mg  x 1  Vancomycin  dosing per levels with unstable renal function F/u HD schedule and infectious work up   Height: 6\' 3"  (190.5 cm) Weight: 75 kg (165 lb 5.5 oz) IBW/kg (Calculated) : 84.5  Temp (24hrs), Avg:97.7 F (36.5 C), Min:97.7 F (36.5 C), Max:97.7 F (36.5 C)  Recent Labs  Lab 06/16/23 0929 06/20/23 1823 06/20/23 2008  WBC 7.4 10.2  --   CREATININE 10.29* 11.16*  --   LATICACIDVEN  --  0.7 0.7    Estimated Creatinine Clearance: 7.9 mL/min (A) (by C-G formula based on SCr of 11.16 mg/dL (H)).    Allergies  Allergen Reactions   Promethazine  Diarrhea and Other (See Comments)    Muscle cramps, cramping "all over"    Antimicrobials this admission: Ceftriaxone  4/29 Flagyl  4/29 Vancomycin  4/29  Thank you for allowing pharmacy to be a part of this patient's care.  Fonda Hymen 06/21/2023 2:11 AM

## 2023-06-21 NOTE — Consult Note (Signed)
 Consultation Note Date: 06/21/2023   Patient Name: [name].  DOB: 10-Dec-1970  MRN: [id number]  Age / Sex: 53 y.o., male  PCP: Ayesha Lente, FNP Referring Physician: Wynetta Heckle, MD  Reason for Consultation: Establishing goals of care  HPI/Patient Profile: 53 y.o. 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   Clinical Assessment and Goals of Care: I have reviewed medical records including EPIC notes, labs and imaging, received report from RN, assessed the patient.  [name] is seen in the ED.  He is lying quietly on the stretcher in the dark room.  He appears chronically ill and somewhat frail.  He greets me, making and somewhat keeping eye contact.  He is alert and oriented, able to make his needs known.  There is no family at bedside at this time.  We talked about his acute health concerns and the treatment plan.  We talked about the plan for transfer to [facility] to which she is agreeable.  We talked about good infection and C. difficile.  We talked about left heel infection.  [name] denies questions or concerns at this time.  We talked about HCPOA.  He names his wife, [name]. We talked about CODE STATUS.  [name] states that he would not want attempted resuscitation but states his wife does not agree.  It is noted that he is DNR.  I encouraged him to have continued discussions  with his wife about his wishes.  I shared that the palliative medicine team will follow him in [place].  He is agreeable for me to call his wife.  Call to wife, Paublo Zimmerle, to discuss diagnosis prognosis, GOC, EOL wishes, disposition and options.  I introduced Palliative Medicine as specialized medical care for people living with serious illness. It focuses on providing relief from the symptoms and stress of a serious illness. The goal is to improve quality of life for both the patient and the family.  We focused on their current illness.  We talked about [name]'s chronic illness.  We talked about acute infection in his left heel.  [name] endorses that he would accept an amputation if needed to preserve his life.  We talked about the treatment plan and time for outcomes.  The natural disease trajectory and expectations at EOL were discussed.  Advanced directives, concepts specific to code status, artifical feeding and hydration, and rehospitalization were considered and discussed.  [name] endorses DNR.  Palliative Care services outpatient were explained and offered.  [name] is open to outpatient palliative services.  Provider choice offered,  she chooses [facility].  Discussed the importance of continued conversation with family and the medical providers regarding overall plan of care and treatment options, ensuring decisions are within the context of the patient's values and GOCs.  Questions and concerns were addressed. The family was encouraged to call with questions or concerns.  PMT will continue to support holistically.  Conference with attending, bedside nursing staff, transition of care team related to patient condition, needs, goals of care, disposition, out patient palliative services with [facility], [facility].    HCPOA  NEXT OF KIN -[name] names his wife, Algird Rietz as his healthcare surrogate.    SUMMARY OF RECOMMENDATIONS   Continue to treat the  treatable but no CPR or intubation.  Patient endorses DNR Transfer to [facility] for further care Time for outcomes Outpatient palliative services with [facility]   Code Status/Advance Care Planning: DNR -verified with patient and spouse  Symptom Management:  Per hospitalist/nephrologist, no additional needs at this time. No morphine  for ESRD  Palliative Prophylaxis:  Frequent Pain Assessment, Oral Care, and Palliative Wound Care  Additional Recommendations (Limitations, Scope, Preferences): Continue to treat but no CPR or intubation  Psycho-social/Spiritual:  Desire for further Chaplaincy support:no Additional Recommendations: Caregiving  Support/Resources  Prognosis:  Unable to determine, based on outcomes.  Guarded.  6 months or less would not be surprising based on chronic illness burden, decreasing function status, failure to thrive.  Discharge Planning: To Be Determined      Primary Diagnoses: Present on Admission:  Syncope   I have reviewed the medical record, interviewed the patient and family, and examined the patient. The following aspects are pertinent.  Past Medical History:  Diagnosis Date   Acute Bil  CVAs of Bil Frontal amd Rt Parietal  in Watershed Distribution 04/21/2021   Patchy acute cortically-based infarcts within the bilateral high  frontal lobes and right parietal lobe (predominantly in a watershed  distribution).   Subcentimeter acute infarct within the callosal splenium on the  left.     Acute bilateral cerebral infarction in a watershed distribution [facility]) 04/21/2021   a.) MRI brain 04/21/2021 --> patchy ACUTE cortically-based infarcts within the bilateral high frontal lobes and right parietal lobe   Acute cerebral infarction (HCC) 02/13/2021   a.) MRI brain 02/13/2021: acute LEFT hippocampal infarct   Acute cerebral infarction (HCC) 04/21/2021   a.) MRI brain 04/21/2021: subcentimeter ACUTE infarct within the callosal splenium on the LEFT   Acute  osteomyelitis of toe of right foot with gangrene (HCC) 02/26/2022   Anaphylactic reaction due to adverse effect of correct drug or medicament properly administered, initial encounter 05/08/2019   Anemia of chronic renal failure    Anxiety    Aortic dilatation (HCC) 11/25/2020   a.) TTE 11/25/2020: Ao root measured 38 mm. b.) TTE 04/22/2021: Ao root measured 44 mm; c. 11/2021 Echo: Ao root 40mm.   Atrial fibrillation (HCC)    a.) CHA2DS2-VASc = 6 (CHF, HTN, CVA x2, prior MI, T2DM). b.) rate/rhythm maintained on oral amiodarone  + carvedilol ; chronic OAC/AP therapy discontinued following ICH   Benign prostatic hyperplasia with lower urinary tract symptoms 11/07/2019   BPH (benign prostatic hyperplasia)    Cardiac arrest (HCC) 07/26/2021   a.) in setting of hyperkalemia, NSTEMI, and seizure following missed HD; pulseless and apneic --> required 1 round of CPR prior to ROSC   Cellulitis    Cerebral hemorrhage (HCC) 11/21/2021   a.) CT head 11/21/2021 --> 1.4 x 1.5 x 3.1 cm ACUTE hemorrhage within  the inferior aspect of the fourth ventricle, and extending inferiorly through the foramen of foramen of [place] --> occurred in setting of HTN emergency and DOAC/APT-->eliquis /plavix  d/c'd.   Cerebral microvascular disease    Chronic diarrhea    Chronic heart failure with preserved ejection fraction (HFpEF) (HCC) 07/19/2017   a.) 06/2017 Echo: EF 75%; b.) 11/2020 Echo: EF 55-60%; c.) 04/2021 Echo: EF 55-60%; d.) 11/2021 Echo: EF 65-70%, GrII DD, nl RV fxn, sev dil LA, large circumferential pericardial eff w/o tamponade, triv MR, AoV sclerosis. Ao root 40mm; e.) TTE 02/28/2025: EF 60-65%, sev LVH, mod posterior LV effusion, AoV sclerosis   Chronic pain of both ankles    Coronary artery disease 02/26/2020   a. 02/2020 Ant STEMI/PCI: LM nl, LAD 52m (3.5x30 Resolute Onyx), RI nl, LCX min irregs, RCA min irregs. EF 65%.   DDD (degenerative disc disease), cervical    Diabetes mellitus, type II (HCC)    Diabetic  neuropathy (HCC)    Esophagitis    ESRD (end stage renal disease) on dialysis (HCC)    a.) [facility]; T-Th-Sat   Frequent falls    Gait instability    Gastritis    Gastroparesis    GERD (gastroesophageal reflux disease)    H/O enucleation of left eyeball    Heart palpitations    History of kidney stones    HLD (hyperlipidemia)    Hypertension    Insomnia    a.) uses melatonin PRN   Long term current use of amiodarone     Nausea and vomiting in adult    recurrent   NSTEMI (non-ST elevated myocardial infarction) (HCC) 07/26/2021   a.) hyperkalemic from missed HD; seizures; decompensated to cardiac arrest and required CPR x 1 round prior to ROSC.   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  Added automatically from request for surgery [id number]     Osteomyelitis [facility])    Pericardial effusion    a. 11/2021 Echo: EF 65-70%, GrII DD, nl RV fxn, sev dil LA, large circumferential pericardial eff w/o tamponade, triv MR, AoV sclerosis. Ao root 40mm.   Seizure (HCC)    a.) last 07/26/2021 in setting of missed HD --> hyperkalemic at 6.5 --> pulseless/apneic and required CPR; discharged home on levetiracetam .   ST elevation myocardial infarction (STEMI) of anterior wall (HCC) 02/26/2020   a.) LHC/PCI 02/26/2020 --> EF 65%; LVEDP 11 mmHg; 90% mLAD (3.5 x 20 mm Resolute Onyx DES x 1)   Social History   Socioeconomic History   Marital status: Married    Spouse name: [name]    Number of children: 2   Years of education: Not on file   Highest education level: High school graduate  Occupational History   Occupation: Disability    Comment: not employed  Tobacco Use   Smoking status: Never   Smokeless tobacco: Never  Vaping Use   Vaping status: Never Used  Substance and Sexual Activity   Alcohol  use: No   Drug use: No   Sexual activity: Yes  Other Topics Concern   Not on file  Social History Narrative    Oldest son killed in car crash June 2020.  Lives in [place] with his wife.   Social Drivers of Health   Financial Resource Strain: High Risk (07/05/2017)   Overall Financial Resource Strain (CARDIA)    Difficulty of Paying Living Expenses: Hard  Food Insecurity: No Food Insecurity (05/22/2023)   Hunger  Vital Sign    Worried About Programme researcher, broadcasting/film/video in the Last Year: Never true    Ran Out of Food in the Last Year: Never true  Transportation Needs: No Transportation Needs (05/22/2023)   PRAPARE - Administrator, Civil Service (Medical): No    Lack of Transportation (Non-Medical): No  Physical Activity: Inactive (07/05/2017)   Exercise Vital Sign    Days of Exercise per Week: 0 days    Minutes of Exercise per Session: 0 min  Stress: Stress Concern Present (07/05/2017)   [employer] of Occupational Health - Occupational Stress Questionnaire    Feeling of Stress : Rather much  Social Connections: Socially Integrated (05/05/2023)   Social Connection and Isolation Panel [NHANES]    Frequency of Communication with Friends and Family: Three times a week    Frequency of Social Gatherings with Friends and Family: Once a week    Attends Religious Services: More than 4 times per year    Active Member of [employer] or Organizations: Yes    Attends Banker Meetings: Never    Marital Status: Married   Family History  Problem Relation Age of Onset   CAD Father    Stroke Father    Diabetes Mellitus II Mother    Kidney failure Mother    Schizophrenia Mother    Scheduled Meds:  amiodarone   200 mg Oral Daily   aspirin  EC  81 mg Oral Q breakfast   atorvastatin   80 mg Oral QHS   calcium  acetate  1,334 mg Oral TID   carvedilol   25 mg Oral BID   heparin   5,000 Units Subcutaneous Q8H   levETIRAcetam   500 mg Oral Once per day on Sunday Tuesday Thursday Saturday   [START ON 06/22/2023] levETIRAcetam   750 mg Oral Once per day on Monday Wednesday Friday   liver oil-zinc   oxide   Topical BID   sodium chloride  flush  3 mL Intravenous Q12H   vancomycin  variable dose per unstable renal function (pharmacist dosing)   Does not apply See admin instructions   Continuous Infusions:  cefTRIAXone  (ROCEPHIN )  IV Stopped (06/21/23 0254)   metronidazole  Stopped (06/21/23 0403)   PRN Meds:.acetaminophen , hydrALAZINE , melatonin, ondansetron  (ZOFRAN ) IV, polyethylene glycol Medications Prior to Admission:  Prior to Admission medications   Medication Sig Start Date End Date Taking? Authorizing Provider  acetaminophen  (TYLENOL ) 325 MG tablet Take 2 tablets (650 mg total) by mouth every 6 (six) hours as needed for mild pain (pain score 1-3) or fever (or Fever >/= 101). 05/26/23  Yes Emokpae, Courage, MD  Amino Acids-Protein Hydrolys (PRO-STAT AWC) LIQD Take 30 mLs by mouth every morning. 06/10/23  Yes [provider]  amiodarone  (PACERONE ) 200 MG tablet Take 1 tablet (200 mg total) by mouth daily. 05/26/23 05/26/24 Yes Emokpae, Courage, MD  ammonium lactate  (AMLACTIN DAILY) 12 % lotion Apply topically. 02/25/23  Yes [provider]  atorvastatin  (LIPITOR ) 80 MG tablet Take 1 tablet (80 mg total) by mouth at bedtime. 05/26/23  Yes Emokpae, Courage, MD  brimonidine  (ALPHAGAN ) 0.2 % ophthalmic solution Place 1 drop into the right eye 3 (three) times daily. Patient taking differently: Place 1 drop into the right eye 2 (two) times daily. 05/30/23  Yes Ninetta Basket, MD  calcitRIOL  (ROCALTROL ) 0.25 MCG capsule Take 9 capsules (2.25 mcg total) by mouth every Tuesday, Thursday, and Saturday at 6 PM. Patient taking differently: Take 0.25 mcg by mouth in the morning, at noon, and at bedtime. Every  Tues-Thur-Sat 02/24/23  Yes Danford, Willis Harter, MD  calcium  acetate (PHOSLO ) 667 MG tablet Take 1,334 mg by mouth 3 (three) times daily. 06/18/23  Yes [provider]  carvedilol  (COREG ) 12.5 MG tablet Take 1 tablet (12.5 mg total) by mouth 2 (two) times daily. 05/26/23  Yes  Colin Dawley, MD  cinacalcet  (SENSIPAR ) 30 MG tablet Take 2 tablets (60 mg total) by mouth every Tuesday, Thursday, and Saturday at 6 PM. 02/24/23  Yes Danford, Willis Harter, MD  dorzolamide -timolol  (COSOPT ) 2-0.5 % ophthalmic solution Place 1 drop into the right eye 2 (two) times daily. 11/15/22 11/15/23 Yes [provider]  levETIRAcetam  (KEPPRA ) 500 MG tablet Take 1 tablet (500 mg total) by mouth 4 (four) times a week. On Sunday, Monday, Wednesday and Friday, all non-dialysis days 05/26/23  Yes Emokpae, Courage, MD  levETIRAcetam  (KEPPRA ) 750 MG tablet Take 1 tablet (750 mg total) by mouth 3 (three) times a week. On Tuesday Thursday and Saturday, on dialysis days 05/27/23  Yes Emokpae, Courage, MD  lidocaine -prilocaine  (EMLA ) cream Apply 1 Application topically 3 (three) times a week. 12/24/21  Yes [provider]  loperamide  (IMODIUM ) 2 MG capsule Take 1 capsule (2 mg total) by mouth as needed for diarrhea or loose stools. 02/23/23  Yes Danford, Willis Harter, MD  Nutritional Supplements (,FEEDING SUPPLEMENT, PROSOURCE PLUS) liquid Take 30 mLs by mouth 3 (three) times daily between meals. 02/23/23  Yes Danford, Willis Harter, MD  ondansetron  (ZOFRAN ) 4 MG tablet Take 1 tablet (4 mg total) by mouth every 6 (six) hours as needed for nausea or vomiting. 03/17/23  Yes Adel Aden, PA-C  oxyCODONE  (OXY IR/ROXICODONE ) 5 MG immediate release tablet Take 1 tablet (5 mg total) by mouth every 4 (four) hours as needed for moderate pain (pain score 4-6). 05/26/23  Yes Emokpae, Courage, MD  ascorbic acid  (VITAMIN C ) 500 MG tablet Take 1,000 mg by mouth daily. Patient not taking: Reported on 06/21/2023    [provider]  aspirin  EC 81 MG tablet Take 1 tablet (81 mg total) by mouth daily with breakfast. Swallow whole. Patient not taking: Reported on 06/21/2023 05/26/23   Colin Dawley, MD  multivitamin (RENA-VIT) TABS tablet Take 1 tablet by mouth at bedtime. Patient not taking: Reported  on 06/16/2023 04/12/23   Rai, Hurman Maiden, MD  polyethylene glycol (MIRALAX  / GLYCOLAX ) 17 g packet Take 17 g by mouth daily. 05/26/23   Colin Dawley, MD  Zinc  Oxide (DESITIN MAXIMUM STRENGTH) 40 % PSTE Apply 1 Application topically 2 (two) times daily as needed. 06/16/23   Teddi Favors, DO   Allergies  Allergen Reactions   Promethazine  Diarrhea and Other (See Comments)    Muscle cramps, cramping "all over"   Review of Systems  Unable to perform ROS: Acuity of condition    Physical Exam Vitals and nursing note reviewed.  Constitutional:      General: He is not in acute distress.    Appearance: He is ill-appearing.  Cardiovascular:     Rate and Rhythm: Normal rate.  Pulmonary:     Effort: Pulmonary effort is normal. No respiratory distress.  Skin:    General: Skin is warm and dry.  Neurological:     Mental Status: He is alert and oriented to person, place, and time.  Psychiatric:        Mood and Affect: Mood normal.        Behavior: Behavior normal.     Comments: Calm and cooperative  Vital Signs: BP (!) 199/82   Pulse 68   Temp 97.9 F (36.6 C) (Oral)   Resp 16   Ht 6\' 3"  (1.905 m)   Wt 75 kg   SpO2 100%   BMI 20.67 kg/m  Pain Scale: 0-10   Pain Score: 3    SpO2: SpO2: 100 % O2 Device:SpO2: 100 % O2 Flow Rate: .   IO: Intake/output summary: No intake or output data in the 24 hours ending 06/21/23 0855  LBM: Last BM Date : 06/21/23 Baseline Weight: Weight: 75 kg Most recent weight: Weight: 75 kg     Palliative Assessment/Data:     Time In: 0830   Time Out: 0925 Time Total: 55 minutes  Greater than 50%  of this time was spent counseling and coordinating care related to the above assessment and plan.  Signed by: Annabelle Barrack, NP   Please contact Palliative Medicine Team phone at [phone] for questions and concerns.  For individual provider: See Tilford Foley

## 2023-06-21 NOTE — TOC Initial Note (Addendum)
 Transition of Care (TOC) - Initial/Assessment Note    Patient Details  Name: [name]. MRN: [id number] Date of Birth: 18-Mar-1970  Transition of Care [facility]) CM/SW Contact:    Linnea Richards, LCSW Phone Number: 06/21/2023, 10:55 AM  Clinical Narrative:                  Pt admitted from home. He has a high readmission risk score. Pt known to TOC from previous admissions.  Pt lives with his wife but she works during the day. Pt has recently been to [facility] He is active with [facility] PT/OT. He goes to [facility] 1st shift. Pt has needed DME at home. He is able to get to appointments and obtain medications.   MD stated pt will transfer to [facility]. There is a consult for SNF placement. PT eval pending.  TOC will follow and continue to assess and assist with dc planning.  1536: Updated by Palliative APNP that pt and family requesting referral for outpatient palliative with [facility]. Spoke with Odilia Bennett at [facility] to refer.   Barriers to Discharge: Continued Medical Work up   Patient Goals and CMS Choice Patient states their goals for this hospitalization and ongoing recovery are:: get better CMS Medicare.gov Compare Post Acute Care list provided to:: Patient Represenative (must comment) Choice offered to / list presented to : Spouse      Expected Discharge Plan and Services In-house Referral: Clinical Social Work   Post Acute Care Choice: Resumption of Svcs/PTA Provider Living arrangements for the past 2 months: Single Family Home                                      Prior Living Arrangements/Services Living arrangements for the past 2 months: Single Family Home Lives with:: Spouse Patient language and need for interpreter reviewed:: Yes Do you feel safe going back to the place where you live?: Yes      Need for Family Participation in Patient Care: Yes (Comment) Care giver support system in place?: Yes  (comment) Current home services: DME, Home OT, Home PT Criminal Activity/Legal Involvement Pertinent to Current Situation/Hospitalization: No - Comment as needed  Activities of Daily Living      Permission Sought/Granted                  Emotional Assessment       Orientation: : Oriented to Self, Oriented to Place, Oriented to  Time, Oriented to Situation Alcohol  / Substance Use: Not Applicable Psych Involvement: No (comment)  Admission diagnosis:  Syncope [R55] Patient Active Problem List   Diagnosis Date Noted   Syncope 06/21/2023   Hypoglycemia 06/21/2023   Ischemic ulcer of left heel (HCC) 06/21/2023   Failure to thrive in adult 06/21/2023   Generalized weakness 05/22/2023   Hx of AKA (above knee amputation), right (HCC) 05/11/2023   Pleural effusion 05/05/2023   Seizures (HCC) 05/05/2023   Right BKA infection (HCC) 04/04/2023   Pressure injury of skin 04/04/2023   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 02/26/2022   H/O enucleation of left eyeball 02/26/2022   PAD (peripheral artery disease) (HCC) 02/26/2022   History of seizure 02/26/2022   Seasonal allergic rhinitis 07/08/2021   Myoclonus 06/23/2021   Steal syndrome as complication of dialysis access [facility]) 02/06/2021   Coronary artery disease 02/05/2021   Dehiscence of amputation stump of right lower extremity (HCC) 01/29/2021   ESRD on hemodialysis (HCC)    Anemia due to chronic kidney disease, on chronic dialysis (HCC)    Paroxysmal atrial fibrillation (HCC) -  not on systemic anticoagulation due to ICH while on Eliquis  in 11-2021. 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) 08/19/2020   Anxiety state 08/19/2020   Callosity 08/19/2020   ED (erectile dysfunction) of organic origin 08/19/2020   Long term (current) use of insulin  (HCC) 08/19/2020   Myalgia 08/19/2020   CAD/ H/o prior STEMI 02/2020 s/p DES to LAD 08/19/2020   Blindness of left eye 08/15/2020   Combined forms of age-related cataract of right eye 04/17/2020   Moderate protein-calorie malnutrition (HCC) 05/14/2019   Disorder of phosphorus metabolism, unspecified 05/07/2019   Iron deficiency anemia, unspecified 05/07/2019   Secondary hyperparathyroidism of renal origin (HCC) 11/28/2018   Chronic Grade 2 diastolic heart failure/EF 55 to 60% 12/13/2017   Diabetes mellitus, type II (HCC) 11/23/2017   Diabetic retinopathy (HCC) 07/05/2017   Uncontrolled type 2 diabetes mellitus with hyperglycemia, with long-term current use of insulin  (HCC) 04/20/2016   Mixed hyperlipidemia    Essential hypertension, benign    PCP:  Ayesha Lente, FNP Pharmacy:   [facility] - [place], [place] - [address] [place] [address] Phone: [phone] Fax: [phone]     Social Drivers of Health (SDOH) Social History: SDOH Screenings   Food Insecurity: No Food Insecurity (05/22/2023)  Housing: High Risk (05/22/2023)  Transportation Needs: No Transportation Needs (05/22/2023)  Utilities: Not At Risk (05/22/2023)  Financial Resource Strain: High Risk (07/05/2017)  Physical Activity: Inactive (07/05/2017)  Social Connections: Socially Integrated (05/05/2023)  Stress: Stress Concern Present (07/05/2017)  Tobacco Use: Low Risk  (06/21/2023)  Health Literacy: Low Risk  (06/01/2020)   Received from [facility]   SDOH Interventions:     Readmission Risk Interventions    06/21/2023   10:51 AM 05/17/2023    3:44 PM 07/27/2021   11:49 AM   Readmission Risk Prevention Plan  Transportation Screening Complete Complete Complete  Medication Review (RN Care Manager) Complete Referral to Pharmacy Complete  PCP or Specialist appointment within 3-5 days of discharge  Complete   HRI or Home Care Consult Complete Complete   SW Recovery Care/Counseling Consult Complete Complete Complete  Palliative Care Screening Not Applicable Not Applicable Not Applicable  Skilled Nursing Facility Not Applicable Not Applicable Not Applicable

## 2023-06-21 NOTE — Consult Note (Signed)
 Renal Service Consult Note [facility]  Timofey Makel Elting. 06/21/2023 Lynae Sandifer, MD Requesting Physician: Dr Eilene Grater  Reason for Consult: ESRD patient with hypoglycemia and infected left foot HPI: The patient is a 52 y.o. year-old w/ PMH as below who presented to ED at [facility] yesterday afternoon brought from primary care office after having a syncopal episode with associated hypoglycemia.  He also was noted to have an infected wound in the left foot.  In the ED blood pressure was 180/80, heart rate 58, RR 18 and temp 98.  K+ 4.6, BUN 75, creatinine 11.1, Hgb 7.5.  Patient was admitted and transferred to [facility] today due to possible need for orthopedic care.  We are asked to see for dialysis.   Pt seen in room. No c/o's at this time. Wants a regular diet if that's okay.    ROS - denies CP, no joint pain, no HA, no blurry vision, no rash, no diarrhea, no nausea/ vomiting  PMH: History of prior strokes Anxiety Anemia BPH Cardiac arrest Chronic diarrhea Chronic diastolic heart failure CAD DVT history of PCI PVD Diabetes type 2 Esophagitis ESRD on dialysis Gait instability Gastroparesis HL Hypertension Osteomyelitis Seizures  Past Surgical History  Past Surgical History:  Procedure Laterality Date   A/V FISTULAGRAM Left 08/31/2021   Procedure: A/V Fistulagram;  Surgeon: Celso College, MD;  Location: [name] INVASIVE CV LAB;  Service: Cardiovascular;  Laterality: Left;   A/V FISTULAGRAM Left 01/25/2022   Procedure: A/V Fistulagram;  Surgeon: Celso College, MD;  Location: [name] INVASIVE CV LAB;  Service: Cardiovascular;  Laterality: Left;   A/V FISTULAGRAM Right 10/18/2022   Procedure: A/V Fistulagram;  Surgeon: Celso College, MD;  Location: [name] INVASIVE CV LAB;  Service: Cardiovascular;  Laterality: Right;   ABDOMINAL AORTOGRAM W/LOWER EXTREMITY Bilateral 02/08/2023   Procedure: ABDOMINAL AORTOGRAM W/LOWER EXTREMITY;  Surgeon: Philipp Brawn, MD;   Location: [facility] INVASIVE CV LAB;  Service: Vascular;  Laterality: Bilateral;   AMPUTATION Left 03/16/2016   Procedure: AMPUTATION DIGIT LEFT HALLUX;  Surgeon: Charity Conch, DPM;  Location: [facility] OR;  Service: Podiatry;  Laterality: Left;  can start around 5    AMPUTATION Right 02/09/2021   Procedure: AMPUTATION DIGIT;  Surgeon: Marilyn Shropshire, MD;  Location: [facility] OR;  Service: Orthopedics;  Laterality: Right;   AMPUTATION Right 02/04/2023   Procedure: AMPUTATION 4TH AND 5TH METATARSAL;  Surgeon: Evertt Hoe, DPM;  Location: [facility] OR;  Service: Orthopedics/Podiatry;  Laterality: Right;  I&D, partial ray resection, abx beads   AMPUTATION Right 02/09/2023   Procedure: RIGHT BELOW KNEE AMPUTATION;  Surgeon: Timothy Ford, MD;  Location: [facility] OR;  Service: Orthopedics;  Laterality: Right;   AMPUTATION Right 04/06/2023   Procedure: RIGHT ABOVE KNEE AMPUTATION;  Surgeon: Timothy Ford, MD;  Location: [facility] OR;  Service: Orthopedics;  Laterality: Right;   AMPUTATION Right 05/11/2023   Procedure: RIGHT ABOVE KNEE AMPUTATION;  Surgeon: Timothy Ford, MD;  Location: [facility] OR;  Service: Orthopedics;  Laterality: Right;  REVISION RIGHT ABOVE KNEE AMPUTATION   AMPUTATION TOE Right 02/28/2022   Procedure: AMPUTATION TOE;  Surgeon: Jennefer Moats, DPM;  Location: [name] ORS;  Service: Podiatry;  Laterality: Right;  right fifth toe   ARTHROSCOPIC REPAIR ACL Left    AV FISTULA PLACEMENT Right 05/31/2019   Procedure: Brachiocephalic AV fistula creation;  Surgeon: Celso College, MD;  Location: [name] ORS;  Service: Vascular;  Laterality: Right;   AV FISTULA PLACEMENT Left 08/13/2021  Procedure: ARTERIOVENOUS (AV) FISTULA CREATION (BRACHIALCEPHALIC);  Surgeon: Celso College, MD;  Location: [name] ORS;  Service: Vascular;  Laterality: Left;   CHEST TUBE INSERTION Right 05/06/2023   Procedure: CHEST TUBE INSERTION;  Surgeon: [name] Nigh, MD;  Location: [facility] ENDOSCOPY;  Service: Pulmonary;  Laterality: Right;   COLONOSCOPY  WITH PROPOFOL  N/A 10/28/2015   Procedure: COLONOSCOPY WITH PROPOFOL ;  Surgeon: Deveron Fly, MD;  Location: [facility] ENDOSCOPY;  Service: Endoscopy;  Laterality: N/A;   COLONOSCOPY WITH PROPOFOL  N/A 10/29/2015   Procedure: COLONOSCOPY WITH PROPOFOL ;  Surgeon: Deveron Fly, MD;  Location: [facility] ENDOSCOPY;  Service: Endoscopy;  Laterality: N/A;   COLONOSCOPY WITH PROPOFOL  N/A 01/27/2023   Procedure: COLONOSCOPY WITH PROPOFOL ;  Surgeon: Hargis Lias, MD;  Location: [facility] ENDO SUITE;  Service: Endoscopy;  Laterality: N/A;  12:30pm, asa 3   CORONARY ULTRASOUND/IVUS N/A 02/26/2020   Procedure: Intravascular Ultrasound/IVUS;  Surgeon: Lucendia Rusk, MD;  Location: [facility] INVASIVE CV LAB;  Service: Cardiovascular;  Laterality: N/A;   CORONARY/GRAFT ACUTE MI REVASCULARIZATION N/A 02/26/2020   Procedure: Coronary/Graft Acute MI Revascularization;  Surgeon: Lucendia Rusk, MD;  Location: [facility] INVASIVE CV LAB;  Service: Cardiovascular;  Laterality: N/A;   DIALYSIS/PERMA CATHETER INSERTION Right 04/26/2019   Perm Cath    DIALYSIS/PERMA CATHETER INSERTION N/A 04/26/2019   Procedure: DIALYSIS/PERMA CATHETER INSERTION;  Surgeon: Celso College, MD;  Location: [name] INVASIVE CV LAB;  Service: Cardiovascular;  Laterality: N/A;   DIALYSIS/PERMA CATHETER INSERTION N/A 05/25/2021   Procedure: DIALYSIS/PERMA CATHETER INSERTION;  Surgeon: Celso College, MD;  Location: [name] INVASIVE CV LAB;  Service: Cardiovascular;  Laterality: N/A;   DIALYSIS/PERMA CATHETER INSERTION N/A 08/31/2021   Procedure: DIALYSIS/PERMA CATHETER INSERTION;  Surgeon: Celso College, MD;  Location: [name] INVASIVE CV LAB;  Service: Cardiovascular;  Laterality: N/A;   DIALYSIS/PERMA CATHETER REMOVAL N/A 08/15/2019   Procedure: DIALYSIS/PERMA CATHETER REMOVAL;  Surgeon: Jackquelyn Mass, MD;  Location: [name] INVASIVE CV LAB;  Service: Cardiovascular;  Laterality: N/A;   DIALYSIS/PERMA CATHETER REMOVAL N/A 03/15/2022   Procedure: DIALYSIS/PERMA  CATHETER REMOVAL;  Surgeon: Celso College, MD;  Location: [name] INVASIVE CV LAB;  Service: Cardiovascular;  Laterality: N/A;   EMBOLIZATION (CATH LAB) Right 02/06/2021   Procedure: EMBOLIZATION;  Surgeon: Celso College, MD;  Location: [name] INVASIVE CV LAB;  Service: Cardiovascular;  Laterality: Right;  Right Upper Extremity Dialysis Access, Permcath Placement   ESOPHAGOGASTRODUODENOSCOPY (EGD) WITH PROPOFOL  N/A 12/27/2017   Procedure: ESOPHAGOGASTRODUODENOSCOPY (EGD) WITH PROPOFOL ;  Surgeon: Toledo, Alphonsus Jeans, MD;  Location: [name] ENDOSCOPY;  Service: Gastroenterology;  Laterality: N/A;   EYE SURGERY     IR THORACENTESIS ASP PLEURAL SPACE W/IMG GUIDE  05/05/2023   LEFT HEART CATH AND CORONARY ANGIOGRAPHY N/A 02/26/2020   Procedure: LEFT HEART CATH AND CORONARY ANGIOGRAPHY;  Surgeon: Lucendia Rusk, MD;  Location: [facility] INVASIVE CV LAB;  Service: Cardiovascular;  Laterality: N/A;   PROSTATE SURGERY  2016   REVISON OF ARTERIOVENOUS FISTULA Right 07/22/2022   Procedure: RESECTION OF ANEURYSMAL RIGHT ARM ARTERIOVENOUS FISTULA;  Surgeon: Celso College, MD;  Location: [name] ORS;  Service: Vascular;  Laterality: Right;   TONSILECTOMY/ADENOIDECTOMY WITH MYRINGOTOMY     TONSILLECTOMY     UPPER EXTREMITY ANGIOGRAPHY Right 11/26/2020   Procedure: Upper Extremity Angiography;  Surgeon: Celso College, MD;  Location: [name] INVASIVE CV LAB;  Service: Cardiovascular;  Laterality: Right;   UPPER EXTREMITY ANGIOGRAPHY Right 02/05/2021   Procedure: UPPER EXTREMITY ANGIOGRAPHY;  Surgeon: Celso College, MD;  Location:  [name] INVASIVE CV LAB;  Service: Cardiovascular;  Laterality: Right;   Family History  Family History  Problem Relation Age of Onset   CAD Father    Stroke Father    Diabetes Mellitus II Mother    Kidney failure Mother    Schizophrenia Mother    Social History  reports that he has never smoked. He has never used smokeless tobacco. He reports that he does not drink alcohol  and does not use  drugs. Allergies  Allergies  Allergen Reactions   Promethazine  Diarrhea and Other (See Comments)    Muscle cramps, cramping "all over"   Home medications Prior to Admission medications   Medication Sig Start Date End Date Taking? Authorizing Provider  acetaminophen  (TYLENOL ) 325 MG tablet Take 2 tablets (650 mg total) by mouth every 6 (six) hours as needed for mild pain (pain score 1-3) or fever (or Fever >/= 101). 05/26/23  Yes Emokpae, Courage, MD  Amino Acids-Protein Hydrolys (PRO-STAT AWC) LIQD Take 30 mLs by mouth every morning. 06/10/23  Yes [provider]  amiodarone  (PACERONE ) 200 MG tablet Take 1 tablet (200 mg total) by mouth daily. 05/26/23 05/26/24 Yes Emokpae, Courage, MD  ammonium lactate  (AMLACTIN DAILY) 12 % lotion Apply topically. 02/25/23  Yes [provider]  atorvastatin  (LIPITOR ) 80 MG tablet Take 1 tablet (80 mg total) by mouth at bedtime. 05/26/23  Yes Emokpae, Courage, MD  brimonidine  (ALPHAGAN ) 0.2 % ophthalmic solution Place 1 drop into the right eye 3 (three) times daily. Patient taking differently: Place 1 drop into the right eye 2 (two) times daily. 05/30/23  Yes Ninetta Basket, MD  calcitRIOL  (ROCALTROL ) 0.25 MCG capsule Take 9 capsules (2.25 mcg total) by mouth every Tuesday, Thursday, and Saturday at 6 PM. Patient taking differently: Take 0.25 mcg by mouth in the morning, at noon, and at bedtime. Every Tues-Thur-Sat 02/24/23  Yes Danford, Willis Harter, MD  calcium  acetate (PHOSLO ) 667 MG tablet Take 1,334 mg by mouth 3 (three) times daily. 06/18/23  Yes [provider]  carvedilol  (COREG ) 12.5 MG tablet Take 1 tablet (12.5 mg total) by mouth 2 (two) times daily. 05/26/23  Yes Colin Dawley, MD  cinacalcet  (SENSIPAR ) 30 MG tablet Take 2 tablets (60 mg total) by mouth every Tuesday, Thursday, and Saturday at 6 PM. 02/24/23  Yes Danford, Willis Harter, MD  dorzolamide -timolol  (COSOPT ) 2-0.5 % ophthalmic solution Place 1 drop into the right eye 2  (two) times daily. 11/15/22 11/15/23 Yes [provider]  levETIRAcetam  (KEPPRA ) 500 MG tablet Take 1 tablet (500 mg total) by mouth 4 (four) times a week. On Sunday, Monday, Wednesday and Friday, all non-dialysis days 05/26/23  Yes Emokpae, Courage, MD  levETIRAcetam  (KEPPRA ) 750 MG tablet Take 1 tablet (750 mg total) by mouth 3 (three) times a week. On Tuesday Thursday and Saturday, on dialysis days 05/27/23  Yes Emokpae, Courage, MD  lidocaine -prilocaine  (EMLA ) cream Apply 1 Application topically 3 (three) times a week. 12/24/21  Yes [provider]  loperamide  (IMODIUM ) 2 MG capsule Take 1 capsule (2 mg total) by mouth as needed for diarrhea or loose stools. 02/23/23  Yes Danford, Willis Harter, MD  Nutritional Supplements (,FEEDING SUPPLEMENT, PROSOURCE PLUS) liquid Take 30 mLs by mouth 3 (three) times daily between meals. 02/23/23  Yes Danford, Willis Harter, MD  ondansetron  (ZOFRAN ) 4 MG tablet Take 1 tablet (4 mg total) by mouth every 6 (six) hours as needed for nausea or vomiting. 03/17/23  Yes Adel Aden, PA-C  oxyCODONE  (OXY  IR/ROXICODONE ) 5 MG immediate release tablet Take 1 tablet (5 mg total) by mouth every 4 (four) hours as needed for moderate pain (pain score 4-6). 05/26/23  Yes Emokpae, Courage, MD  polyethylene glycol (MIRALAX  / GLYCOLAX ) 17 g packet Take 17 g by mouth daily. 05/26/23  Yes Emokpae, Courage, MD  Zinc  Oxide (DESITIN MAXIMUM STRENGTH) 40 % PSTE Apply 1 Application topically 2 (two) times daily as needed. 06/16/23  Yes Russella Courts A, DO  ascorbic acid  (VITAMIN C ) 500 MG tablet Take 1,000 mg by mouth daily. Patient not taking: Reported on 06/21/2023    [provider]  aspirin  EC 81 MG tablet Take 1 tablet (81 mg total) by mouth daily with breakfast. Swallow whole. Patient not taking: Reported on 06/21/2023 05/26/23   Colin Dawley, MD  multivitamin (RENA-VIT) TABS tablet Take 1 tablet by mouth at bedtime. Patient not taking: Reported on 06/16/2023  04/12/23   Rai, Hurman Maiden, MD     Vitals:   06/21/23 1056 06/21/23 1058 06/21/23 1119 06/21/23 1300  BP: (!) 163/97 (!) 185/75  (!) 177/78  Pulse: 70 63 63 (!) 58  Resp:  18    Temp:    98.2 F (36.8 C)  TempSrc:    Oral  SpO2:  99% 100% 100%  Weight:      Height:       Exam Gen alert, no distress No rash, cyanosis or gangrene Sclera anicteric, throat clear  No jvd or bruits Chest clear bilat to bases, no rales/ wheezing RRR no MRG Abd soft ntnd no mass or ascites +bs GU nl male MS R AKA, L foot bandaged Ext  no LE or UE edema, no other edema Neuro is alert, Ox 3 , nf    LUA AVF+bruit       Renal-related home meds: PhosLo  2 AC 3 times daily Coreg  12.5 twice daily Rena-Vite 1 daily Others: Aspirin , Oxy IR, Keppra , Lipitor , amiodarone     OP HD: [facility] From 05/17/23--> 3h   LUE AVF   B400  Heparin  1000 bolus + 400u/hr    Assessment/ Plan: Infected wound L foot: hx of R AKA for similar issues. Ortho to be consulted.  Syncope/ hypoglycemia: admitting issues, per pmd Failure to thrive: per pmd ESRD: on HD TTS. Plan HD tomorrow most likely off schedule.  HTN: bp's are a bit high to normal. Cont home meds.  Volume: no vol excess on exam. Get records in am.  Anemia of esrd: Hb 7- 9 here, transfuse prn.  Secondary hyperparathyroidism: CCa in range, cont binders w/ meals Nutrition: requesting reg diet, knows to avoid OJ. Have changed to reg diet w/ 1200 cc FR.  Seizure d/o - on keppra  DM2 - per pmd      Larry Poag  MD [facility] 06/21/2023, 4:09 PM  Recent Labs  Lab 06/16/23 0929 06/20/23 1823 06/21/23 0517  HGB 8.4* 7.5* 7.8*  ALBUMIN  2.4* 2.5*  --   CALCIUM  8.9 8.7* 8.6*  PHOS  --   --  8.5*  CREATININE 10.29* 11.16* 11.55*  K 5.1 4.6 4.7   Inpatient medications:  acidophilus  2 capsule Oral TID   amiodarone   200 mg Oral Daily   aspirin  EC  81 mg Oral Q breakfast   atorvastatin   80 mg Oral QHS   calcium  acetate  1,334 mg Oral TID    carvedilol   25 mg Oral BID   feeding supplement (NEPRO CARB STEADY)  237 mL Oral TID BM  fidaxomicin  200 mg Oral BID   heparin   5,000 Units Subcutaneous Q8H   levETIRAcetam   500 mg Oral Once per day on Sunday Tuesday Thursday Saturday   [START ON 06/22/2023] levETIRAcetam   750 mg Oral Once per day on Monday Wednesday Friday   liver oil-zinc  oxide   Topical BID   multivitamin  1 tablet Oral QHS   sodium chloride  flush  3 mL Intravenous Q12H   vancomycin  variable dose per unstable renal function (pharmacist dosing)   Does not apply See admin instructions    [START ON 06/22/2023] cefTRIAXone  (ROCEPHIN )  IV     metronidazole  500 mg (06/21/23 1559)   vancomycin      acetaminophen , hydrALAZINE , melatonin, ondansetron  (ZOFRAN ) IV, polyethylene glycol

## 2023-06-21 NOTE — Hospital Course (Addendum)
 [name]. is a 53 y.o. 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 Incidentally noted to have infected ischemic wound of the left foot.    Assessment & Plan:   Active Problems:   Syncope   Hypoglycemia   Ischemic ulcer of left heel (HCC)   Failure to thrive in adult C. difficile colitis  Syncopal episode  w Hypoglycemic episode  -Reportedly slumped over and unconscious in PCP office. CBG 60 at the time;   - He is not on DM medications, does have poor oral intake.  - decreased intake + lightheadedness, despite ESRD may be slightly volume down.  - Orthostatics - CBG every 6 hour   Infected ischemic ulcer of the left heel: -History of gangrenous wounds on the right and has ultimately had a right AKA.  - On the left she has developed an ischemic wound over the heel which has been dry.   -  Over the past 3 days wife has noted now draining purulent material (she has picture of gross purulence from the proximal aspect of the wound on her phone).  Otherwise no systemic signs of infection.   -WBC is 7.  -Attempted to contact Ortho care but not successful, have messaged PA Magnant re: orthopedic consult in the morning.    Admitted and transferred to  [facility] for ortho evaluation   -Has seen vascular surgery and 12/' 24 angiogram with three-vessel runoff to the foot but microvascular disease present with minimal pedal vessel filling.   - Started on vancomycin  pharmacy to dose, ceftriaxone  2 g IV every 24 hour, Flagyl  500 mg IV  every 12 hours.  -Wound care consult     Failure to thrive Progressive weakness now wheelchair-bound and using a Hoyer; despite recent SNF stay. diminished oral intake only taking 1 meal per day. -Nutrition consult -PT/OT -palliative consult   C. difficile colitis  - Diarrhea-stool PCR positive for C. Difficile - Start oral Fidaxomicin -continue IV Flagyl  for now    chronic myocardial injury No history of chest pain.   -EKG does have ST depression and T wave inversion laterally, T wave inversion inferiorly.   - High-sensitivity troponin low level elevated and flat 28 -> 30 which is consistent with his chronic troponin.  Likely in the setting of underlying CAD, ESRD -Continued management for his chronic CAD per below   Sacral wound Wound care consult     Chronic medical problems:  ESRD,Tu/Th/Sat HD: Routine nephrology consult in the morning for HD (not contacted overnight)  CAD history STEMI / Hx CVA: Continue home aspirin , atorvastatin  80 mg,  Coreg  12.5 mg twice daily  HFpEF: Without acute exacerbation.  Not on diuretics  Paroxysmal A-fib off anticoagulation: Currently in sinus rhythm, not on anticoagulation.  Continue home amiodarone , coreg  per above.   Hx IPH: Noted, off AC.   Seizure disorder: Continue home Keppra  500 mg nondialysis days, 750 mg on HD days.  DM type II diet controlled: Recent hypoglycemia, not on diabetic medications.  Every 6hr CBG checks, hypoglycemia protocol   Hypertension: Currently uncontrolled, continue home  antihypertensives per above Chronic anemia: Hx anemia CKD, defer to nephro re: Epo  Enucleation OS, hx DM retinopathy, glaucoma: Noted, ophtho f/u OP.

## 2023-06-21 NOTE — ED Notes (Signed)
 [facility] called delay in transport d/t mechanical issues.

## 2023-06-21 NOTE — Progress Notes (Signed)
 Initial Nutrition Assessment  DOCUMENTATION CODES:   Not applicable  INTERVENTION:   Renal MVI daily. Nepro Shake po TID, each supplement provides 425 kcal and 19 grams protein.  NUTRITION DIAGNOSIS:   Increased nutrient needs related to wound healing as evidenced by estimated needs.  GOAL:   Patient will meet greater than or equal to 90% of their needs  MONITOR:   PO intake, Supplement acceptance  REASON FOR ASSESSMENT:   Consult Assessment of nutrition requirement/status  ASSESSMENT:   53 yo male admitted with FTT, infected ischemic wound of the L foot, syncope, hypoglycemia. PMH includes PAD, chronic ischemic L heel ulcer, R AKA, ESRD on HD TTS, CAD, CVA, HF, PAF, seizure D/O, DM-2, HTN, HLD, wheelchair bound, chronic diarrhea, gastroparesis, BPH.  Patient is currently being transferred to [facility]. Unable to speak with him or complete NFPE. Patient with increased nutrition needs d/t infected wound and ESRD on HD. Will add PO supplement to increase protein and calorie provision.   Currently on a renal diet, meal completions not recorded as patient has been in the ED.  Labs reviewed. Phos 8.5 CBG: 132 this AM  Medications reviewed and include phoslo , keppra , IV antibiotics.  Weight history reviewed. Patient with 8% weight loss over the past 12 months. Weight appears to fluctuate, so actual weight loss may be greater. Overall weight is trending downward. With hx of ESRD on HD, weight fluctuations are also likely related to fluids.   NUTRITION - FOCUSED PHYSICAL EXAM:  Unable to complete, patient is in the ED  Diet Order:   Diet Order             Diet renal with fluid restriction Fluid restriction: 1200 mL Fluid; Room service appropriate? Yes; Fluid consistency: Thin  Diet effective now                   EDUCATION NEEDS:   Not appropriate for education at this time  Skin:  Skin Assessment: Skin Integrity Issues: Skin Integrity Issues:: Other  (Comment) Other: infected ischemic wound L foot  Last BM:  4/29 type 7  Height:   Ht Readings from Last 1 Encounters:  06/20/23 6\' 3"  (1.905 m)    Weight:   Wt Readings from Last 1 Encounters:  06/20/23 75 kg    Ideal Body Weight:  82 kg  BMI:  Body mass index is 20.67 kg/m. Adjusted BMI 22.5 (R AKA)  Estimated Nutritional Needs:   Kcal:  2300-2500  Protein:  110-125 gm  Fluid:  1 L + UOP   Barnet Boots RD, LDN, CNSC Contact via secure chat. If unavailable, use group chat "RD Inpatient."

## 2023-06-21 NOTE — ED Notes (Signed)
 Pt had breakfast tray. This tech fed the pt his breakfast, had all of grits, eggs and pancake with milk.

## 2023-06-21 NOTE — Progress Notes (Signed)
 Psychosocial Progressive/Outcome: ANOx4, calm and cooperative  Family at bedside   Pain/Comfort Progression/Outcome: Pt complained of pain on sacrum area, treated by repositioning pt  Clinical Progression/Outcome: Adequate fluid and diet intake  Independent in positioning, turned pt with blankets  NO STICK/BP on LUE - L fistula  R AKA  Oliurgic  No BM reported on my shift  Dressings D/C/I Slept in between care Maintained safety

## 2023-06-21 NOTE — Telephone Encounter (Signed)
 Patient Product/process development scientist completed.    The patient is insured through [place]. Patient has Medicare and is not eligible for a copay card, but may be able to apply for patient assistance or Medicare RX Payment Plan (Patient Must reach out to their plan, if eligible for payment plan), if available.    Ran test claim for Dificid 200 mg and the current 10 day co-pay is $1,642.90.   This test claim was processed through [facility]- copay amounts may vary at other pharmacies due to pharmacy/plan contracts, or as the patient moves through the different stages of their insurance plan.     Morgan Arab, CPHT Pharmacy Technician III Certified Patient Advocate [facility] Patient Advocate Team Direct Number: [phone]  Fax: [phone]

## 2023-06-21 NOTE — ED Notes (Signed)
 Patient leaving with [facility] at this time. VSS. A+Ox4. Shelagh Derrick, RN notified by Edsel Grace, RN.

## 2023-06-21 NOTE — Consult Note (Signed)
 WOC Nurse Consult Note: this patient is familiar to [facility] team from previous admissions with history of Unstageable L heel Pressure Injury; patient has been followed by Dr. Julio Ohm and vascular surgery, R AKA by Dr. Julio Ohm  Reason for Consult: L heel and sacral wound  Wound type: 1 Left heel Unstageable PI  Pressure Injury POA: Yes Measurement: see nursing flowsheet  Wound bed: L heel 100% black eschar  Drainage (amount, consistency, odor) purulent drainage per wife, none currently  Periwound: hyperkeratotic skin  Dressing procedure/placement/frequency: Cleanse L heel with soap and water , dry and apply Xeroform gauze ([place] [id number]) to wound bed daily, cover with dry gauze and Kerlix roll gauze. Place L foot in Prevalon boot to offload pressure [facility] [phone]).   Per bedside nurse there is no active open wound on sacrum, just tenderness.  Cover with silicone foam, lift daily to assess area.  Re-consult if wound noted.    POC discussed with bedside nurse. Pending consult with orthopedics.  Wound care orders placed by orthopedics supercede wound care orders placed by [facility] RN. WOC team will not follow. Re-consult if further needs arise.   Thank you,    Ronni Colace MSN, RN-BC, [employer] [phone]

## 2023-06-22 ENCOUNTER — Telehealth (HOSPITAL_COMMUNITY): Payer: Self-pay | Admitting: Pharmacy Technician

## 2023-06-22 ENCOUNTER — Other Ambulatory Visit (HOSPITAL_COMMUNITY): Payer: Self-pay

## 2023-06-22 DIAGNOSIS — R630 Anorexia: Secondary | ICD-10-CM

## 2023-06-22 DIAGNOSIS — L97424 Non-pressure chronic ulcer of left heel and midfoot with necrosis of bone: Secondary | ICD-10-CM

## 2023-06-22 DIAGNOSIS — F32A Depression, unspecified: Secondary | ICD-10-CM

## 2023-06-22 DIAGNOSIS — R627 Adult failure to thrive: Secondary | ICD-10-CM | POA: Diagnosis not present

## 2023-06-22 LAB — GLUCOSE, CAPILLARY
Glucose-Capillary: 105 mg/dL — ABNORMAL HIGH (ref 70–99)
Glucose-Capillary: 66 mg/dL — ABNORMAL LOW (ref 70–99)
Glucose-Capillary: 84 mg/dL (ref 70–99)

## 2023-06-22 LAB — COMPREHENSIVE METABOLIC PANEL WITH GFR
ALT: 11 U/L (ref 0–44)
AST: 8 U/L — ABNORMAL LOW (ref 15–41)
Albumin: 2.3 g/dL — ABNORMAL LOW (ref 3.5–5.0)
Alkaline Phosphatase: 62 U/L (ref 38–126)
Anion gap: 17 — ABNORMAL HIGH (ref 5–15)
BUN: 87 mg/dL — ABNORMAL HIGH (ref 6–20)
CO2: 14 mmol/L — ABNORMAL LOW (ref 22–32)
Calcium: 8.5 mg/dL — ABNORMAL LOW (ref 8.9–10.3)
Chloride: 112 mmol/L — ABNORMAL HIGH (ref 98–111)
Creatinine, Ser: 12.51 mg/dL — ABNORMAL HIGH (ref 0.61–1.24)
GFR, Estimated: 4 mL/min — ABNORMAL LOW (ref >60)
Glucose, Bld: 59 mg/dL — ABNORMAL LOW (ref 70–99)
Potassium: 5.4 mmol/L — ABNORMAL HIGH (ref 3.5–5.1)
Sodium: 143 mmol/L (ref 135–145)
Total Bilirubin: 0.7 mg/dL (ref 0.0–1.2)
Total Protein: 6 g/dL — ABNORMAL LOW (ref 6.5–8.1)

## 2023-06-22 LAB — GASTROINTESTINAL PANEL BY PCR, STOOL (REPLACES STOOL CULTURE)

## 2023-06-22 LAB — LEVETIRACETAM LEVEL: Levetiracetam Lvl: 31.8 ug/mL (ref 10.0–40.0)

## 2023-06-22 LAB — HEPATITIS B SURFACE ANTIBODY, QUANTITATIVE: Hep B S AB Quant (Post): 30.3 m[IU]/mL

## 2023-06-22 LAB — CBC
HCT: 25.5 % — ABNORMAL LOW (ref 39.0–52.0)
Hemoglobin: 7.6 g/dL — ABNORMAL LOW (ref 13.0–17.0)
MCH: 21.7 pg — ABNORMAL LOW (ref 26.0–34.0)
MCHC: 29.8 g/dL — ABNORMAL LOW (ref 30.0–36.0)
MCV: 72.6 fL — ABNORMAL LOW (ref 80.0–100.0)
Platelets: 97 10*3/uL — ABNORMAL LOW (ref 150–400)
RBC: 3.51 MIL/uL — ABNORMAL LOW (ref 4.22–5.81)
RDW: 24.5 % — ABNORMAL HIGH (ref 11.5–15.5)
WBC: 7.4 10*3/uL (ref 4.0–10.5)
nRBC: 0 % (ref 0.0–0.2)

## 2023-06-22 MED ORDER — ALTEPLASE 2 MG IJ SOLR: 2.0000 mg | Freq: Once | INTRAMUSCULAR | Status: DC | PRN

## 2023-06-22 MED ORDER — LIDOCAINE HCL (PF) 1 % IJ SOLN: 5.0000 mL | INTRAMUSCULAR | Status: DC | PRN

## 2023-06-22 MED ORDER — DARBEPOETIN ALFA 100 MCG/0.5ML IJ SOSY
100.0000 ug | PREFILLED_SYRINGE | INTRAMUSCULAR | Status: DC
  Administered 2023-06-23 – 2023-07-07 (×3): 100 ug via SUBCUTANEOUS
  Filled 2023-06-22 (×4): qty 0.5

## 2023-06-22 MED ORDER — ANTICOAGULANT SODIUM CITRATE 4% (200MG/5ML) IV SOLN: 5.0000 mL | Status: DC | PRN

## 2023-06-22 MED ORDER — HEPARIN SODIUM (PORCINE) 1000 UNIT/ML DIALYSIS: 1000.0000 [IU] | INTRAMUSCULAR | Status: DC | PRN

## 2023-06-22 MED ORDER — LIDOCAINE-PRILOCAINE 2.5-2.5 % EX CREA: 1.0000 | TOPICAL_CREAM | CUTANEOUS | Status: DC | PRN

## 2023-06-22 MED ORDER — PENTAFLUOROPROP-TETRAFLUOROETH EX AERO: 1.0000 | INHALATION_SPRAY | CUTANEOUS | Status: DC | PRN

## 2023-06-22 MED ORDER — NEPRO/CARBSTEADY PO LIQD: 237.0000 mL | ORAL | Status: DC | PRN

## 2023-06-22 MED ORDER — HEPARIN SODIUM (PORCINE) 1000 UNIT/ML DIALYSIS
1500.0000 [IU] | INTRAMUSCULAR | Status: AC | PRN
  Administered 2023-06-22: 1500 [IU] via INTRAVENOUS_CENTRAL

## 2023-06-22 MED ORDER — HEPARIN SODIUM (PORCINE) 1000 UNIT/ML DIALYSIS
2000.0000 [IU] | Freq: Once | INTRAMUSCULAR | Status: AC
  Administered 2023-06-22: 2000 [IU] via INTRAVENOUS_CENTRAL

## 2023-06-22 MED ORDER — HYDRALAZINE HCL 20 MG/ML IJ SOLN
INTRAMUSCULAR | Status: AC
  Filled 2023-06-22: qty 1

## 2023-06-22 MED ORDER — HEPARIN SODIUM (PORCINE) 1000 UNIT/ML IJ SOLN
INTRAMUSCULAR | Status: AC
  Filled 2023-06-22: qty 4

## 2023-06-22 NOTE — Consult Note (Signed)
 ORTHOPAEDIC CONSULTATION  REQUESTING PHYSICIAN: Oral Billings, MD  Chief Complaint: Purulent drainage from chronic left heel ulcer.  HPI: [name] Calvary Sprott. is a 53 y.o. male who presents with chronic osteomyelitis left calcaneus.  Patient states he has developed acute purulent drainage.  Patient has type 2 diabetes with end-stage renal disease on dialysis.  Past Medical History:  Diagnosis Date   Acute Bil  CVAs of Bil Frontal amd Rt Parietal  in Watershed Distribution 04/21/2021   Patchy acute cortically-based infarcts within the bilateral high  frontal lobes and right parietal lobe (predominantly in a watershed  distribution).   Subcentimeter acute infarct within the callosal splenium on the  left.     Acute bilateral cerebral infarction in a watershed distribution [facility]) 04/21/2021   a.) MRI brain 04/21/2021 --> patchy ACUTE cortically-based infarcts within the bilateral high frontal lobes and right parietal lobe   Acute cerebral infarction (HCC) 02/13/2021   a.) MRI brain 02/13/2021: acute LEFT hippocampal infarct   Acute cerebral infarction (HCC) 04/21/2021   a.) MRI brain 04/21/2021: subcentimeter ACUTE infarct within the callosal splenium on the LEFT   Acute osteomyelitis of toe of right foot with gangrene (HCC) 02/26/2022   Anaphylactic reaction due to adverse effect of correct drug or medicament properly administered, initial encounter 05/08/2019   Anemia of chronic renal failure    Anxiety    Aortic dilatation (HCC) 11/25/2020   a.) TTE 11/25/2020: Ao root measured 38 mm. b.) TTE 04/22/2021: Ao root measured 44 mm; c. 11/2021 Echo: Ao root 40mm.   Atrial fibrillation (HCC)    a.) CHA2DS2-VASc = 6 (CHF, HTN, CVA x2, prior MI, T2DM). b.) rate/rhythm maintained on oral amiodarone  + carvedilol ; chronic OAC/AP therapy discontinued following ICH   Benign prostatic hyperplasia with lower urinary tract symptoms 11/07/2019   BPH (benign prostatic hyperplasia)    Cardiac arrest  (HCC) 07/26/2021   a.) in setting of hyperkalemia, NSTEMI, and seizure following missed HD; pulseless and apneic --> required 1 round of CPR prior to ROSC   Cellulitis    Cerebral hemorrhage (HCC) 11/21/2021   a.) CT head 11/21/2021 --> 1.4 x 1.5 x 3.1 cm ACUTE hemorrhage within the inferior aspect of the fourth ventricle, and extending inferiorly through the foramen of foramen of [place] --> occurred in setting of HTN emergency and DOAC/APT-->eliquis /plavix  d/c'd.   Cerebral microvascular disease    Chronic diarrhea    Chronic heart failure with preserved ejection fraction (HFpEF) (HCC) 07/19/2017   a.) 06/2017 Echo: EF 75%; b.) 11/2020 Echo: EF 55-60%; c.) 04/2021 Echo: EF 55-60%; d.) 11/2021 Echo: EF 65-70%, GrII DD, nl RV fxn, sev dil LA, large circumferential pericardial eff w/o tamponade, triv MR, AoV sclerosis. Ao root 40mm; e.) TTE 02/28/2025: EF 60-65%, sev LVH, mod posterior LV effusion, AoV sclerosis   Chronic pain of both ankles    Coronary artery disease 02/26/2020   a. 02/2020 Ant STEMI/PCI: LM nl, LAD 46m (3.5x30 Resolute Onyx), RI nl, LCX min irregs, RCA min irregs. EF 65%.   DDD (degenerative disc disease), cervical    Diabetes mellitus, type II (HCC)    Diabetic neuropathy (HCC)    Esophagitis    ESRD (end stage renal disease) on dialysis (HCC)    a.) [facility]; T-Th-Sat   Frequent falls    Gait instability    Gastritis    Gastroparesis    GERD (gastroesophageal reflux disease)    H/O enucleation of left eyeball    Heart palpitations  History of kidney stones    HLD (hyperlipidemia)    Hypertension    Insomnia    a.) uses melatonin PRN   Long term current use of amiodarone     Nausea and vomiting in adult    recurrent   NSTEMI (non-ST elevated myocardial infarction) (HCC) 07/26/2021   a.) hyperkalemic from missed HD; seizures; decompensated to cardiac arrest and required CPR x 1 round prior to ROSC.   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  Added automatically from request for surgery [id number]     Osteomyelitis [facility])    Pericardial effusion    a. 11/2021 Echo: EF 65-70%, GrII DD, nl RV fxn, sev dil LA, large circumferential pericardial eff w/o tamponade, triv MR, AoV sclerosis. Ao root 40mm.   Seizure (HCC)    a.) last 07/26/2021 in setting of missed HD --> hyperkalemic at 6.5 --> pulseless/apneic and required CPR; discharged home on levetiracetam .   ST elevation myocardial infarction (STEMI) of anterior wall (HCC) 02/26/2020   a.) LHC/PCI 02/26/2020 --> EF 65%; LVEDP 11 mmHg; 90% mLAD (3.5 x 20 mm Resolute Onyx DES x 1)   Past Surgical History:  Procedure Laterality Date   A/V FISTULAGRAM Left 08/31/2021   Procedure: A/V Fistulagram;  Surgeon: Celso College, MD;  Location: [name] INVASIVE CV LAB;  Service: Cardiovascular;  Laterality: Left;   A/V FISTULAGRAM Left 01/25/2022   Procedure: A/V Fistulagram;  Surgeon: Celso College, MD;  Location: [name] INVASIVE CV LAB;  Service: Cardiovascular;  Laterality: Left;   A/V FISTULAGRAM Right 10/18/2022   Procedure: A/V Fistulagram;  Surgeon: Celso College, MD;  Location: [name] INVASIVE CV LAB;  Service: Cardiovascular;  Laterality: Right;   ABDOMINAL AORTOGRAM W/LOWER EXTREMITY Bilateral 02/08/2023   Procedure: ABDOMINAL AORTOGRAM W/LOWER EXTREMITY;  Surgeon: Philipp Brawn, MD;  Location: [facility] INVASIVE CV LAB;  Service: Vascular;  Laterality: Bilateral;   AMPUTATION Left 03/16/2016   Procedure: AMPUTATION DIGIT LEFT HALLUX;  Surgeon: Charity Conch, DPM;  Location: [facility] OR;  Service: Podiatry;  Laterality: Left;  can start around 5    AMPUTATION Right 02/09/2021   Procedure: AMPUTATION DIGIT;  Surgeon: Marilyn Shropshire, MD;  Location: [facility] OR;  Service: Orthopedics;  Laterality: Right;   AMPUTATION Right 02/04/2023   Procedure: AMPUTATION 4TH AND 5TH METATARSAL;  Surgeon: Evertt Hoe, DPM;  Location: [facility] OR;  Service:  Orthopedics/Podiatry;  Laterality: Right;  I&D, partial ray resection, abx beads   AMPUTATION Right 02/09/2023   Procedure: RIGHT BELOW KNEE AMPUTATION;  Surgeon: Timothy Ford, MD;  Location: [facility] OR;  Service: Orthopedics;  Laterality: Right;   AMPUTATION Right 04/06/2023   Procedure: RIGHT ABOVE KNEE AMPUTATION;  Surgeon: Timothy Ford, MD;  Location: [facility] OR;  Service: Orthopedics;  Laterality: Right;   AMPUTATION Right 05/11/2023   Procedure: RIGHT ABOVE KNEE AMPUTATION;  Surgeon: Timothy Ford, MD;  Location[facility] OR;  Service: Orthopedics;  Laterality: Right;  REVISION RIGHT ABOVE KNEE AMPUTATION   AMPUTATION TOE Right 02/28/2022   Procedure: AMPUTATION TOE;  Surgeon: Jennefer Moats, DPM;  Location: [name] ORS;  Service: Podiatry;  Laterality: Right;  right fifth toe   ARTHROSCOPIC REPAIR ACL Left    AV FISTULA PLACEMENT Right 05/31/2019   Procedure: Brachiocephalic AV fistula creation;  Surgeon: Celso College, MD;  Location: [name] ORS;  Service: Vascular;  Laterality: Right;   AV FISTULA PLACEMENT Left 08/13/2021   Procedure: ARTERIOVENOUS (AV)  FISTULA CREATION (BRACHIALCEPHALIC);  Surgeon: Celso College, MD;  Location: [name] ORS;  Service: Vascular;  Laterality: Left;   CHEST TUBE INSERTION Right 05/06/2023   Procedure: CHEST TUBE INSERTION;  Surgeon: [name] Nigh, MD;  Location: [facility] ENDOSCOPY;  Service: Pulmonary;  Laterality: Right;   COLONOSCOPY WITH PROPOFOL  N/A 10/28/2015   Procedure: COLONOSCOPY WITH PROPOFOL ;  Surgeon: Deveron Fly, MD;  Location: [facility] ENDOSCOPY;  Service: Endoscopy;  Laterality: N/A;   COLONOSCOPY WITH PROPOFOL  N/A 10/29/2015   Procedure: COLONOSCOPY WITH PROPOFOL ;  Surgeon: Deveron Fly, MD;  Location: [facility] ENDOSCOPY;  Service: Endoscopy;  Laterality: N/A;   COLONOSCOPY WITH PROPOFOL  N/A 01/27/2023   Procedure: COLONOSCOPY WITH PROPOFOL ;  Surgeon: Hargis Lias, MD;  Location: [facility] ENDO SUITE;  Service: Endoscopy;  Laterality: N/A;  12:30pm, asa 3   CORONARY  ULTRASOUND/IVUS N/A 02/26/2020   Procedure: Intravascular Ultrasound/IVUS;  Surgeon: Lucendia Rusk, MD;  Location: [facility] INVASIVE CV LAB;  Service: Cardiovascular;  Laterality: N/A;   CORONARY/GRAFT ACUTE MI REVASCULARIZATION N/A 02/26/2020   Procedure: Coronary/Graft Acute MI Revascularization;  Surgeon: Lucendia Rusk, MD;  Location: [facility] INVASIVE CV LAB;  Service: Cardiovascular;  Laterality: N/A;   DIALYSIS/PERMA CATHETER INSERTION Right 04/26/2019   Perm Cath    DIALYSIS/PERMA CATHETER INSERTION N/A 04/26/2019   Procedure: DIALYSIS/PERMA CATHETER INSERTION;  Surgeon: Celso College, MD;  Location: [name] INVASIVE CV LAB;  Service: Cardiovascular;  Laterality: N/A;   DIALYSIS/PERMA CATHETER INSERTION N/A 05/25/2021   Procedure: DIALYSIS/PERMA CATHETER INSERTION;  Surgeon: Celso College, MD;  Location: [name] INVASIVE CV LAB;  Service: Cardiovascular;  Laterality: N/A;   DIALYSIS/PERMA CATHETER INSERTION N/A 08/31/2021   Procedure: DIALYSIS/PERMA CATHETER INSERTION;  Surgeon: Celso College, MD;  Location: [name] INVASIVE CV LAB;  Service: Cardiovascular;  Laterality: N/A;   DIALYSIS/PERMA CATHETER REMOVAL N/A 08/15/2019   Procedure: DIALYSIS/PERMA CATHETER REMOVAL;  Surgeon: Jackquelyn Mass, MD;  Location: [name] INVASIVE CV LAB;  Service: Cardiovascular;  Laterality: N/A;   DIALYSIS/PERMA CATHETER REMOVAL N/A 03/15/2022   Procedure: DIALYSIS/PERMA CATHETER REMOVAL;  Surgeon: Celso College, MD;  Location: [name] INVASIVE CV LAB;  Service: Cardiovascular;  Laterality: N/A;   EMBOLIZATION (CATH LAB) Right 02/06/2021   Procedure: EMBOLIZATION;  Surgeon: Celso College, MD;  Location: [name] INVASIVE CV LAB;  Service: Cardiovascular;  Laterality: Right;  Right Upper Extremity Dialysis Access, Permcath Placement   ESOPHAGOGASTRODUODENOSCOPY (EGD) WITH PROPOFOL  N/A 12/27/2017   Procedure: ESOPHAGOGASTRODUODENOSCOPY (EGD) WITH PROPOFOL ;  Surgeon: Toledo, Alphonsus Jeans, MD;  Location: [name] ENDOSCOPY;  Service:  Gastroenterology;  Laterality: N/A;   EYE SURGERY     IR THORACENTESIS ASP PLEURAL SPACE W/IMG GUIDE  05/05/2023   LEFT HEART CATH AND CORONARY ANGIOGRAPHY N/A 02/26/2020   Procedure: LEFT HEART CATH AND CORONARY ANGIOGRAPHY;  Surgeon: Lucendia Rusk, MD;  Location: [facility] INVASIVE CV LAB;  Service: Cardiovascular;  Laterality: N/A;   PROSTATE SURGERY  2016   REVISON OF ARTERIOVENOUS FISTULA Right 07/22/2022   Procedure: RESECTION OF ANEURYSMAL RIGHT ARM ARTERIOVENOUS FISTULA;  Surgeon: Celso College, MD;  Location: [name] ORS;  Service: Vascular;  Laterality: Right;   TONSILECTOMY/ADENOIDECTOMY WITH MYRINGOTOMY     TONSILLECTOMY     UPPER EXTREMITY ANGIOGRAPHY Right 11/26/2020   Procedure: Upper Extremity Angiography;  Surgeon: Celso College, MD;  Location: [name] INVASIVE CV LAB;  Service: Cardiovascular;  Laterality: Right;   UPPER EXTREMITY ANGIOGRAPHY Right 02/05/2021   Procedure: UPPER EXTREMITY ANGIOGRAPHY;  Surgeon: Celso College, MD;  Location: [name] INVASIVE CV  LAB;  Service: Cardiovascular;  Laterality: Right;   Social History   Socioeconomic History   Marital status: Married    Spouse name: [name]    Number of children: 2   Years of education: Not on file   Highest education level: High school graduate  Occupational History   Occupation: Disability    Comment: not employed  Tobacco Use   Smoking status: Never   Smokeless tobacco: Never  Vaping Use   Vaping status: Never Used  Substance and Sexual Activity   Alcohol  use: No   Drug use: No   Sexual activity: Yes  Other Topics Concern   Not on file  Social History Narrative   Oldest son killed in car crash June 2020.  Lives in [place] with his wife.   Social Drivers of Corporate investment banker Strain: High Risk (07/05/2017)   Overall Financial Resource Strain (CARDIA)    Difficulty of Paying Living Expenses: Hard  Food Insecurity: No Food Insecurity (06/21/2023)   Hunger Vital Sign    Worried About Running Out  of Food in the Last Year: Never true    Ran Out of Food in the Last Year: Never true  Transportation Needs: No Transportation Needs (05/22/2023)   PRAPARE - Administrator, Civil Service (Medical): No    Lack of Transportation (Non-Medical): No  Physical Activity: Inactive (07/05/2017)   Exercise Vital Sign    Days of Exercise per Week: 0 days    Minutes of Exercise per Session: 0 min  Stress: Stress Concern Present (07/05/2017)   [employer] of Occupational Health - Occupational Stress Questionnaire    Feeling of Stress : Rather much  Social Connections: Socially Integrated (05/05/2023)   Social Connection and Isolation Panel [NHANES]    Frequency of Communication with Friends and Family: Three times a week    Frequency of Social Gatherings with Friends and Family: Once a week    Attends Religious Services: More than 4 times per year    Active Member of [employer] or Organizations: Yes    Attends Banker Meetings: Never    Marital Status: Married   Family History  Problem Relation Age of Onset   CAD Father    Stroke Father    Diabetes Mellitus II Mother    Kidney failure Mother    Schizophrenia Mother    - negative except otherwise stated in the family history section Allergies  Allergen Reactions   Promethazine  Diarrhea and Other (See Comments)    Muscle cramps, cramping "all over"   Prior to Admission medications   Medication Sig Start Date End Date Taking? Authorizing Provider  acetaminophen  (TYLENOL ) 325 MG tablet Take 2 tablets (650 mg total) by mouth every 6 (six) hours as needed for mild pain (pain score 1-3) or fever (or Fever >/= 101). 05/26/23  Yes Emokpae, Courage, MD  Amino Acids-Protein Hydrolys (PRO-STAT AWC) LIQD Take 30 mLs by mouth every morning. 06/10/23  Yes [provider]  amiodarone  (PACERONE ) 200 MG tablet Take 1 tablet (200 mg total) by mouth daily. 05/26/23 05/26/24 Yes Emokpae, Courage, MD  ammonium lactate  (AMLACTIN DAILY) 12  % lotion Apply topically. 02/25/23  Yes [provider]  atorvastatin  (LIPITOR ) 80 MG tablet Take 1 tablet (80 mg total) by mouth at bedtime. 05/26/23  Yes Emokpae, Courage, MD  brimonidine  (ALPHAGAN ) 0.2 % ophthalmic solution Place 1 drop into the right eye 3 (three) times daily. Patient taking differently: Place 1 drop into the  right eye 2 (two) times daily. 05/30/23  Yes Ninetta Basket, MD  calcitRIOL  (ROCALTROL ) 0.25 MCG capsule Take 9 capsules (2.25 mcg total) by mouth every Tuesday, Thursday, and Saturday at 6 PM. Patient taking differently: Take 0.25 mcg by mouth in the morning, at noon, and at bedtime. Every Tues-Thur-Sat 02/24/23  Yes Danford, Willis Harter, MD  calcium  acetate (PHOSLO ) 667 MG tablet Take 1,334 mg by mouth 3 (three) times daily. 06/18/23  Yes [provider]  carvedilol  (COREG ) 12.5 MG tablet Take 1 tablet (12.5 mg total) by mouth 2 (two) times daily. 05/26/23  Yes Colin Dawley, MD  cinacalcet  (SENSIPAR ) 30 MG tablet Take 2 tablets (60 mg total) by mouth every Tuesday, Thursday, and Saturday at 6 PM. 02/24/23  Yes Danford, Willis Harter, MD  dorzolamide -timolol  (COSOPT ) 2-0.5 % ophthalmic solution Place 1 drop into the right eye 2 (two) times daily. 11/15/22 11/15/23 Yes [provider]  levETIRAcetam  (KEPPRA ) 500 MG tablet Take 1 tablet (500 mg total) by mouth 4 (four) times a week. On Sunday, Monday, Wednesday and Friday, all non-dialysis days 05/26/23  Yes Emokpae, Courage, MD  levETIRAcetam  (KEPPRA ) 750 MG tablet Take 1 tablet (750 mg total) by mouth 3 (three) times a week. On Tuesday Thursday and Saturday, on dialysis days 05/27/23  Yes Emokpae, Courage, MD  lidocaine -prilocaine  (EMLA ) cream Apply 1 Application topically 3 (three) times a week. 12/24/21  Yes [provider]  loperamide  (IMODIUM ) 2 MG capsule Take 1 capsule (2 mg total) by mouth as needed for diarrhea or loose stools. 02/23/23  Yes Danford, Willis Harter, MD  Nutritional Supplements  (,FEEDING SUPPLEMENT, PROSOURCE PLUS) liquid Take 30 mLs by mouth 3 (three) times daily between meals. 02/23/23  Yes Danford, Willis Harter, MD  ondansetron  (ZOFRAN ) 4 MG tablet Take 1 tablet (4 mg total) by mouth every 6 (six) hours as needed for nausea or vomiting. 03/17/23  Yes Adel Aden, PA-C  oxyCODONE  (OXY IR/ROXICODONE ) 5 MG immediate release tablet Take 1 tablet (5 mg total) by mouth every 4 (four) hours as needed for moderate pain (pain score 4-6). 05/26/23  Yes Emokpae, Courage, MD  polyethylene glycol (MIRALAX  / GLYCOLAX ) 17 g packet Take 17 g by mouth daily. 05/26/23  Yes Emokpae, Courage, MD  Zinc  Oxide (DESITIN MAXIMUM STRENGTH) 40 % PSTE Apply 1 Application topically 2 (two) times daily as needed. 06/16/23  Yes Russella Courts A, DO  ascorbic acid  (VITAMIN C ) 500 MG tablet Take 1,000 mg by mouth daily. Patient not taking: Reported on 06/21/2023    [provider]  aspirin  EC 81 MG tablet Take 1 tablet (81 mg total) by mouth daily with breakfast. Swallow whole. Patient not taking: Reported on 06/21/2023 05/26/23   Colin Dawley, MD  multivitamin (RENA-VIT) TABS tablet Take 1 tablet by mouth at bedtime. Patient not taking: Reported on 06/16/2023 04/12/23   Loma Rising, MD   DG Foot Complete Left Result Date: 06/20/2023 CLINICAL DATA:  Wound check. Abscess. Wound to the bottom of the left foot. EXAM: LEFT FOOT - COMPLETE 3+ VIEW COMPARISON:  MRI left foot 04/04/2023. Left foot radiographs 05/25/2022 FINDINGS: Previous amputation of the left first toe at the metatarsal-phalangeal level. Postoperative changes versus resorption at the articular surface of the second metatarsal-phalangeal joint. Similar appearance to previous study. Diffuse bone demineralization. Degenerative changes in the interphalangeal and intertarsal joints. No evidence of acute fracture or dislocation. No focal bone lesion or new bone erosion. Extensive vascular calcifications. No radiopaque soft tissue foreign  bodies or soft tissue gas identified. IMPRESSION: 1. Chronic bone changes as discussed above. No acute displaced fractures identified. No acute bone erosion suggested. 2. Extensive soft tissue calcification. No radiopaque foreign bodies or soft tissue gas. Electronically Signed   By: Boyce Byes M.D.   On: 06/20/2023 18:31   - pertinent xrays, CT, MRI studies were reviewed and independently interpreted  Positive ROS: All other systems have been reviewed and were otherwise negative with the exception of those mentioned in the HPI and as above.  Physical Exam: General: Alert, no acute distress Psychiatric: Patient is competent for consent with normal mood and affect Lymphatic: No axillary or cervical lymphadenopathy Cardiovascular: No pedal edema Respiratory: No cyanosis, no use of accessory musculature GI: No organomegaly, abdomen is soft and non-tender    Images:  @ENCIMAGES @  Labs:  Lab Results  Component Value Date   HGBA1C 5.0 05/05/2023   HGBA1C 4.5 (L) 11/21/2021   HGBA1C 4.7 (L) 04/21/2021   ESRSEDRATE 35 (H) 06/21/2023   ESRSEDRATE 74 (H) 05/05/2023   ESRSEDRATE 9 02/27/2022   CRP 1.1 (H) 06/21/2023   CRP 9.9 (H) 05/05/2023   CRP <0.5 02/27/2022   REPTSTATUS 05/27/2023 FINAL 05/22/2023   REPTSTATUS 05/27/2023 FINAL 05/22/2023   GRAMSTAIN  05/05/2023    ABUNDANT WBC PRESENT,BOTH PMN AND MONONUCLEAR NO ORGANISMS SEEN Performed at [facility], [address], [place], [place] [address]    CULT  05/22/2023    NO GROWTH 5 DAYS Performed at [facility], [address], [place], [place] [address]    CULT  05/22/2023    NO GROWTH 5 DAYS Performed at [facility], [address], [place], [place] [address]    [facility] STAPHYLOCOCCUS AUREUS 02/04/2023    Lab Results  Component Value Date   ALBUMIN  2.5 (L) 06/20/2023   ALBUMIN  2.4 (L) 06/16/2023   ALBUMIN  2.2 (L) 05/26/2023   PREALBUMIN 15 (L) 05/11/2023   PREALBUMIN 22 02/27/2022        Latest Ref  Rng & Units 06/21/2023    5:17 AM 06/20/2023    6:23 PM 06/16/2023    9:29 AM  CBC EXTENDED  WBC 4.0 - 10.5 K/uL 8.4  10.2  7.4   RBC 4.22 - 5.81 MIL/uL 3.67  3.54  3.76   Hemoglobin 13.0 - 17.0 g/dL 7.8  7.5  8.4   HCT 29.5 - 52.0 % 27.3  26.1  28.2   Platelets 150 - 400 K/uL 109  102  145   NEUT# 1.7 - 7.7 K/uL  7.1  4.8   Lymph# 0.7 - 4.0 K/uL  1.4  1.3     Neurologic: Patient does not have protective sensation bilateral lower extremities.   MUSCULOSKELETAL:   Skin: Examination patient has a necrotic ulcer of the left heel the skin is thin and atrophic with cellulitis around the wound edges.  Radiographs show extensive soft tissue calcification without gas in the soft tissue.  Patient has chronic osteomyelitis through the forefoot.  Assessment: Assessment: Severe peripheral vascular disease with osteomyelitis and abscess of the left foot.  Plan: Plan: Patient is [name] lift for transfers.  He has a right above-knee amputation.  I have recommended proceeding with an amputation.  Ideally patient would benefit from an above-the-knee amputation however may proceed with a below-knee amputation depending on patient's wishes.  Will plan for surgery on Friday.  Thank you for the consult and the opportunity to see Mr. Braelon Brockmeier, MD [facility] [phone] 10:26  AM

## 2023-06-22 NOTE — Progress Notes (Signed)
 OT Cancellation Note  Patient Details Name: [name]. MRN: [id number] DOB: 03/08/1970   Cancelled Treatment:    Reason Eval/Treat Not Completed: Patient at procedure or test/ unavailable (at HD) Erving Heather OTR/L  Acute Rehab Services  [phone] office number   Stevphen Elders 06/22/2023, 8:51 AM

## 2023-06-22 NOTE — H&P (View-Only) (Signed)
 ORTHOPAEDIC CONSULTATION  REQUESTING PHYSICIAN: Oral Billings, MD  Chief Complaint: Purulent drainage from chronic left heel ulcer.  HPI: [name] Calvary Sprott. is a 53 y.o. male who presents with chronic osteomyelitis left calcaneus.  Patient states he has developed acute purulent drainage.  Patient has type 2 diabetes with end-stage renal disease on dialysis.  Past Medical History:  Diagnosis Date   Acute Bil  CVAs of Bil Frontal amd Rt Parietal  in Watershed Distribution 04/21/2021   Patchy acute cortically-based infarcts within the bilateral high  frontal lobes and right parietal lobe (predominantly in a watershed  distribution).   Subcentimeter acute infarct within the callosal splenium on the  left.     Acute bilateral cerebral infarction in a watershed distribution [facility]) 04/21/2021   a.) MRI brain 04/21/2021 --> patchy ACUTE cortically-based infarcts within the bilateral high frontal lobes and right parietal lobe   Acute cerebral infarction (HCC) 02/13/2021   a.) MRI brain 02/13/2021: acute LEFT hippocampal infarct   Acute cerebral infarction (HCC) 04/21/2021   a.) MRI brain 04/21/2021: subcentimeter ACUTE infarct within the callosal splenium on the LEFT   Acute osteomyelitis of toe of right foot with gangrene (HCC) 02/26/2022   Anaphylactic reaction due to adverse effect of correct drug or medicament properly administered, initial encounter 05/08/2019   Anemia of chronic renal failure    Anxiety    Aortic dilatation (HCC) 11/25/2020   a.) TTE 11/25/2020: Ao root measured 38 mm. b.) TTE 04/22/2021: Ao root measured 44 mm; c. 11/2021 Echo: Ao root 40mm.   Atrial fibrillation (HCC)    a.) CHA2DS2-VASc = 6 (CHF, HTN, CVA x2, prior MI, T2DM). b.) rate/rhythm maintained on oral amiodarone  + carvedilol ; chronic OAC/AP therapy discontinued following ICH   Benign prostatic hyperplasia with lower urinary tract symptoms 11/07/2019   BPH (benign prostatic hyperplasia)    Cardiac arrest  (HCC) 07/26/2021   a.) in setting of hyperkalemia, NSTEMI, and seizure following missed HD; pulseless and apneic --> required 1 round of CPR prior to ROSC   Cellulitis    Cerebral hemorrhage (HCC) 11/21/2021   a.) CT head 11/21/2021 --> 1.4 x 1.5 x 3.1 cm ACUTE hemorrhage within the inferior aspect of the fourth ventricle, and extending inferiorly through the foramen of foramen of [place] --> occurred in setting of HTN emergency and DOAC/APT-->eliquis /plavix  d/c'd.   Cerebral microvascular disease    Chronic diarrhea    Chronic heart failure with preserved ejection fraction (HFpEF) (HCC) 07/19/2017   a.) 06/2017 Echo: EF 75%; b.) 11/2020 Echo: EF 55-60%; c.) 04/2021 Echo: EF 55-60%; d.) 11/2021 Echo: EF 65-70%, GrII DD, nl RV fxn, sev dil LA, large circumferential pericardial eff w/o tamponade, triv MR, AoV sclerosis. Ao root 40mm; e.) TTE 02/28/2025: EF 60-65%, sev LVH, mod posterior LV effusion, AoV sclerosis   Chronic pain of both ankles    Coronary artery disease 02/26/2020   a. 02/2020 Ant STEMI/PCI: LM nl, LAD 46m (3.5x30 Resolute Onyx), RI nl, LCX min irregs, RCA min irregs. EF 65%.   DDD (degenerative disc disease), cervical    Diabetes mellitus, type II (HCC)    Diabetic neuropathy (HCC)    Esophagitis    ESRD (end stage renal disease) on dialysis (HCC)    a.) [facility]; T-Th-Sat   Frequent falls    Gait instability    Gastritis    Gastroparesis    GERD (gastroesophageal reflux disease)    H/O enucleation of left eyeball    Heart palpitations  History of kidney stones    HLD (hyperlipidemia)    Hypertension    Insomnia    a.) uses melatonin PRN   Long term current use of amiodarone     Nausea and vomiting in adult    recurrent   NSTEMI (non-ST elevated myocardial infarction) (HCC) 07/26/2021   a.) hyperkalemic from missed HD; seizures; decompensated to cardiac arrest and required CPR x 1 round prior to ROSC.   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  Added automatically from request for surgery [id number]     Osteomyelitis [facility])    Pericardial effusion    a. 11/2021 Echo: EF 65-70%, GrII DD, nl RV fxn, sev dil LA, large circumferential pericardial eff w/o tamponade, triv MR, AoV sclerosis. Ao root 40mm.   Seizure (HCC)    a.) last 07/26/2021 in setting of missed HD --> hyperkalemic at 6.5 --> pulseless/apneic and required CPR; discharged home on levetiracetam .   ST elevation myocardial infarction (STEMI) of anterior wall (HCC) 02/26/2020   a.) LHC/PCI 02/26/2020 --> EF 65%; LVEDP 11 mmHg; 90% mLAD (3.5 x 20 mm Resolute Onyx DES x 1)   Past Surgical History:  Procedure Laterality Date   A/V FISTULAGRAM Left 08/31/2021   Procedure: A/V Fistulagram;  Surgeon: Celso College, MD;  Location: [name] INVASIVE CV LAB;  Service: Cardiovascular;  Laterality: Left;   A/V FISTULAGRAM Left 01/25/2022   Procedure: A/V Fistulagram;  Surgeon: Celso College, MD;  Location: [name] INVASIVE CV LAB;  Service: Cardiovascular;  Laterality: Left;   A/V FISTULAGRAM Right 10/18/2022   Procedure: A/V Fistulagram;  Surgeon: Celso College, MD;  Location: [name] INVASIVE CV LAB;  Service: Cardiovascular;  Laterality: Right;   ABDOMINAL AORTOGRAM W/LOWER EXTREMITY Bilateral 02/08/2023   Procedure: ABDOMINAL AORTOGRAM W/LOWER EXTREMITY;  Surgeon: Philipp Brawn, MD;  Location: [facility] INVASIVE CV LAB;  Service: Vascular;  Laterality: Bilateral;   AMPUTATION Left 03/16/2016   Procedure: AMPUTATION DIGIT LEFT HALLUX;  Surgeon: Charity Conch, DPM;  Location: [facility] OR;  Service: Podiatry;  Laterality: Left;  can start around 5    AMPUTATION Right 02/09/2021   Procedure: AMPUTATION DIGIT;  Surgeon: Marilyn Shropshire, MD;  Location: [facility] OR;  Service: Orthopedics;  Laterality: Right;   AMPUTATION Right 02/04/2023   Procedure: AMPUTATION 4TH AND 5TH METATARSAL;  Surgeon: Evertt Hoe, DPM;  Location: [facility] OR;  Service:  Orthopedics/Podiatry;  Laterality: Right;  I&D, partial ray resection, abx beads   AMPUTATION Right 02/09/2023   Procedure: RIGHT BELOW KNEE AMPUTATION;  Surgeon: Timothy Ford, MD;  Location: [facility] OR;  Service: Orthopedics;  Laterality: Right;   AMPUTATION Right 04/06/2023   Procedure: RIGHT ABOVE KNEE AMPUTATION;  Surgeon: Timothy Ford, MD;  Location: [facility] OR;  Service: Orthopedics;  Laterality: Right;   AMPUTATION Right 05/11/2023   Procedure: RIGHT ABOVE KNEE AMPUTATION;  Surgeon: Timothy Ford, MD;  Location[facility] OR;  Service: Orthopedics;  Laterality: Right;  REVISION RIGHT ABOVE KNEE AMPUTATION   AMPUTATION TOE Right 02/28/2022   Procedure: AMPUTATION TOE;  Surgeon: Jennefer Moats, DPM;  Location: [name] ORS;  Service: Podiatry;  Laterality: Right;  right fifth toe   ARTHROSCOPIC REPAIR ACL Left    AV FISTULA PLACEMENT Right 05/31/2019   Procedure: Brachiocephalic AV fistula creation;  Surgeon: Celso College, MD;  Location: [name] ORS;  Service: Vascular;  Laterality: Right;   AV FISTULA PLACEMENT Left 08/13/2021   Procedure: ARTERIOVENOUS (AV)  FISTULA CREATION (BRACHIALCEPHALIC);  Surgeon: Celso College, MD;  Location: [name] ORS;  Service: Vascular;  Laterality: Left;   CHEST TUBE INSERTION Right 05/06/2023   Procedure: CHEST TUBE INSERTION;  Surgeon: [name] Nigh, MD;  Location: [facility] ENDOSCOPY;  Service: Pulmonary;  Laterality: Right;   COLONOSCOPY WITH PROPOFOL  N/A 10/28/2015   Procedure: COLONOSCOPY WITH PROPOFOL ;  Surgeon: Deveron Fly, MD;  Location: [facility] ENDOSCOPY;  Service: Endoscopy;  Laterality: N/A;   COLONOSCOPY WITH PROPOFOL  N/A 10/29/2015   Procedure: COLONOSCOPY WITH PROPOFOL ;  Surgeon: Deveron Fly, MD;  Location: [facility] ENDOSCOPY;  Service: Endoscopy;  Laterality: N/A;   COLONOSCOPY WITH PROPOFOL  N/A 01/27/2023   Procedure: COLONOSCOPY WITH PROPOFOL ;  Surgeon: Hargis Lias, MD;  Location: [facility] ENDO SUITE;  Service: Endoscopy;  Laterality: N/A;  12:30pm, asa 3   CORONARY  ULTRASOUND/IVUS N/A 02/26/2020   Procedure: Intravascular Ultrasound/IVUS;  Surgeon: Lucendia Rusk, MD;  Location: [facility] INVASIVE CV LAB;  Service: Cardiovascular;  Laterality: N/A;   CORONARY/GRAFT ACUTE MI REVASCULARIZATION N/A 02/26/2020   Procedure: Coronary/Graft Acute MI Revascularization;  Surgeon: Lucendia Rusk, MD;  Location: [facility] INVASIVE CV LAB;  Service: Cardiovascular;  Laterality: N/A;   DIALYSIS/PERMA CATHETER INSERTION Right 04/26/2019   Perm Cath    DIALYSIS/PERMA CATHETER INSERTION N/A 04/26/2019   Procedure: DIALYSIS/PERMA CATHETER INSERTION;  Surgeon: Celso College, MD;  Location: [name] INVASIVE CV LAB;  Service: Cardiovascular;  Laterality: N/A;   DIALYSIS/PERMA CATHETER INSERTION N/A 05/25/2021   Procedure: DIALYSIS/PERMA CATHETER INSERTION;  Surgeon: Celso College, MD;  Location: [name] INVASIVE CV LAB;  Service: Cardiovascular;  Laterality: N/A;   DIALYSIS/PERMA CATHETER INSERTION N/A 08/31/2021   Procedure: DIALYSIS/PERMA CATHETER INSERTION;  Surgeon: Celso College, MD;  Location: [name] INVASIVE CV LAB;  Service: Cardiovascular;  Laterality: N/A;   DIALYSIS/PERMA CATHETER REMOVAL N/A 08/15/2019   Procedure: DIALYSIS/PERMA CATHETER REMOVAL;  Surgeon: Jackquelyn Mass, MD;  Location: [name] INVASIVE CV LAB;  Service: Cardiovascular;  Laterality: N/A;   DIALYSIS/PERMA CATHETER REMOVAL N/A 03/15/2022   Procedure: DIALYSIS/PERMA CATHETER REMOVAL;  Surgeon: Celso College, MD;  Location: [name] INVASIVE CV LAB;  Service: Cardiovascular;  Laterality: N/A;   EMBOLIZATION (CATH LAB) Right 02/06/2021   Procedure: EMBOLIZATION;  Surgeon: Celso College, MD;  Location: [name] INVASIVE CV LAB;  Service: Cardiovascular;  Laterality: Right;  Right Upper Extremity Dialysis Access, Permcath Placement   ESOPHAGOGASTRODUODENOSCOPY (EGD) WITH PROPOFOL  N/A 12/27/2017   Procedure: ESOPHAGOGASTRODUODENOSCOPY (EGD) WITH PROPOFOL ;  Surgeon: Toledo, Alphonsus Jeans, MD;  Location: [name] ENDOSCOPY;  Service:  Gastroenterology;  Laterality: N/A;   EYE SURGERY     IR THORACENTESIS ASP PLEURAL SPACE W/IMG GUIDE  05/05/2023   LEFT HEART CATH AND CORONARY ANGIOGRAPHY N/A 02/26/2020   Procedure: LEFT HEART CATH AND CORONARY ANGIOGRAPHY;  Surgeon: Lucendia Rusk, MD;  Location: [facility] INVASIVE CV LAB;  Service: Cardiovascular;  Laterality: N/A;   PROSTATE SURGERY  2016   REVISON OF ARTERIOVENOUS FISTULA Right 07/22/2022   Procedure: RESECTION OF ANEURYSMAL RIGHT ARM ARTERIOVENOUS FISTULA;  Surgeon: Celso College, MD;  Location: [name] ORS;  Service: Vascular;  Laterality: Right;   TONSILECTOMY/ADENOIDECTOMY WITH MYRINGOTOMY     TONSILLECTOMY     UPPER EXTREMITY ANGIOGRAPHY Right 11/26/2020   Procedure: Upper Extremity Angiography;  Surgeon: Celso College, MD;  Location: [name] INVASIVE CV LAB;  Service: Cardiovascular;  Laterality: Right;   UPPER EXTREMITY ANGIOGRAPHY Right 02/05/2021   Procedure: UPPER EXTREMITY ANGIOGRAPHY;  Surgeon: Celso College, MD;  Location: [name] INVASIVE CV  LAB;  Service: Cardiovascular;  Laterality: Right;   Social History   Socioeconomic History   Marital status: Married    Spouse name: [name]    Number of children: 2   Years of education: Not on file   Highest education level: High school graduate  Occupational History   Occupation: Disability    Comment: not employed  Tobacco Use   Smoking status: Never   Smokeless tobacco: Never  Vaping Use   Vaping status: Never Used  Substance and Sexual Activity   Alcohol  use: No   Drug use: No   Sexual activity: Yes  Other Topics Concern   Not on file  Social History Narrative   Oldest son killed in car crash June 2020.  Lives in [place] with his wife.   Social Drivers of Corporate investment banker Strain: High Risk (07/05/2017)   Overall Financial Resource Strain (CARDIA)    Difficulty of Paying Living Expenses: Hard  Food Insecurity: No Food Insecurity (06/21/2023)   Hunger Vital Sign    Worried About Running Out  of Food in the Last Year: Never true    Ran Out of Food in the Last Year: Never true  Transportation Needs: No Transportation Needs (05/22/2023)   PRAPARE - Administrator, Civil Service (Medical): No    Lack of Transportation (Non-Medical): No  Physical Activity: Inactive (07/05/2017)   Exercise Vital Sign    Days of Exercise per Week: 0 days    Minutes of Exercise per Session: 0 min  Stress: Stress Concern Present (07/05/2017)   [employer] of Occupational Health - Occupational Stress Questionnaire    Feeling of Stress : Rather much  Social Connections: Socially Integrated (05/05/2023)   Social Connection and Isolation Panel [NHANES]    Frequency of Communication with Friends and Family: Three times a week    Frequency of Social Gatherings with Friends and Family: Once a week    Attends Religious Services: More than 4 times per year    Active Member of [employer] or Organizations: Yes    Attends Banker Meetings: Never    Marital Status: Married   Family History  Problem Relation Age of Onset   CAD Father    Stroke Father    Diabetes Mellitus II Mother    Kidney failure Mother    Schizophrenia Mother    - negative except otherwise stated in the family history section Allergies  Allergen Reactions   Promethazine  Diarrhea and Other (See Comments)    Muscle cramps, cramping "all over"   Prior to Admission medications   Medication Sig Start Date End Date Taking? Authorizing Provider  acetaminophen  (TYLENOL ) 325 MG tablet Take 2 tablets (650 mg total) by mouth every 6 (six) hours as needed for mild pain (pain score 1-3) or fever (or Fever >/= 101). 05/26/23  Yes Emokpae, Courage, MD  Amino Acids-Protein Hydrolys (PRO-STAT AWC) LIQD Take 30 mLs by mouth every morning. 06/10/23  Yes [provider]  amiodarone  (PACERONE ) 200 MG tablet Take 1 tablet (200 mg total) by mouth daily. 05/26/23 05/26/24 Yes Emokpae, Courage, MD  ammonium lactate  (AMLACTIN DAILY) 12  % lotion Apply topically. 02/25/23  Yes [provider]  atorvastatin  (LIPITOR ) 80 MG tablet Take 1 tablet (80 mg total) by mouth at bedtime. 05/26/23  Yes Emokpae, Courage, MD  brimonidine  (ALPHAGAN ) 0.2 % ophthalmic solution Place 1 drop into the right eye 3 (three) times daily. Patient taking differently: Place 1 drop into the  right eye 2 (two) times daily. 05/30/23  Yes Ninetta Basket, MD  calcitRIOL  (ROCALTROL ) 0.25 MCG capsule Take 9 capsules (2.25 mcg total) by mouth every Tuesday, Thursday, and Saturday at 6 PM. Patient taking differently: Take 0.25 mcg by mouth in the morning, at noon, and at bedtime. Every Tues-Thur-Sat 02/24/23  Yes Danford, Willis Harter, MD  calcium  acetate (PHOSLO ) 667 MG tablet Take 1,334 mg by mouth 3 (three) times daily. 06/18/23  Yes [provider]  carvedilol  (COREG ) 12.5 MG tablet Take 1 tablet (12.5 mg total) by mouth 2 (two) times daily. 05/26/23  Yes Colin Dawley, MD  cinacalcet  (SENSIPAR ) 30 MG tablet Take 2 tablets (60 mg total) by mouth every Tuesday, Thursday, and Saturday at 6 PM. 02/24/23  Yes Danford, Willis Harter, MD  dorzolamide -timolol  (COSOPT ) 2-0.5 % ophthalmic solution Place 1 drop into the right eye 2 (two) times daily. 11/15/22 11/15/23 Yes [provider]  levETIRAcetam  (KEPPRA ) 500 MG tablet Take 1 tablet (500 mg total) by mouth 4 (four) times a week. On Sunday, Monday, Wednesday and Friday, all non-dialysis days 05/26/23  Yes Emokpae, Courage, MD  levETIRAcetam  (KEPPRA ) 750 MG tablet Take 1 tablet (750 mg total) by mouth 3 (three) times a week. On Tuesday Thursday and Saturday, on dialysis days 05/27/23  Yes Emokpae, Courage, MD  lidocaine -prilocaine  (EMLA ) cream Apply 1 Application topically 3 (three) times a week. 12/24/21  Yes [provider]  loperamide  (IMODIUM ) 2 MG capsule Take 1 capsule (2 mg total) by mouth as needed for diarrhea or loose stools. 02/23/23  Yes Danford, Willis Harter, MD  Nutritional Supplements  (,FEEDING SUPPLEMENT, PROSOURCE PLUS) liquid Take 30 mLs by mouth 3 (three) times daily between meals. 02/23/23  Yes Danford, Willis Harter, MD  ondansetron  (ZOFRAN ) 4 MG tablet Take 1 tablet (4 mg total) by mouth every 6 (six) hours as needed for nausea or vomiting. 03/17/23  Yes Adel Aden, PA-C  oxyCODONE  (OXY IR/ROXICODONE ) 5 MG immediate release tablet Take 1 tablet (5 mg total) by mouth every 4 (four) hours as needed for moderate pain (pain score 4-6). 05/26/23  Yes Emokpae, Courage, MD  polyethylene glycol (MIRALAX  / GLYCOLAX ) 17 g packet Take 17 g by mouth daily. 05/26/23  Yes Emokpae, Courage, MD  Zinc  Oxide (DESITIN MAXIMUM STRENGTH) 40 % PSTE Apply 1 Application topically 2 (two) times daily as needed. 06/16/23  Yes Russella Courts A, DO  ascorbic acid  (VITAMIN C ) 500 MG tablet Take 1,000 mg by mouth daily. Patient not taking: Reported on 06/21/2023    [provider]  aspirin  EC 81 MG tablet Take 1 tablet (81 mg total) by mouth daily with breakfast. Swallow whole. Patient not taking: Reported on 06/21/2023 05/26/23   Colin Dawley, MD  multivitamin (RENA-VIT) TABS tablet Take 1 tablet by mouth at bedtime. Patient not taking: Reported on 06/16/2023 04/12/23   Loma Rising, MD   DG Foot Complete Left Result Date: 06/20/2023 CLINICAL DATA:  Wound check. Abscess. Wound to the bottom of the left foot. EXAM: LEFT FOOT - COMPLETE 3+ VIEW COMPARISON:  MRI left foot 04/04/2023. Left foot radiographs 05/25/2022 FINDINGS: Previous amputation of the left first toe at the metatarsal-phalangeal level. Postoperative changes versus resorption at the articular surface of the second metatarsal-phalangeal joint. Similar appearance to previous study. Diffuse bone demineralization. Degenerative changes in the interphalangeal and intertarsal joints. No evidence of acute fracture or dislocation. No focal bone lesion or new bone erosion. Extensive vascular calcifications. No radiopaque soft tissue foreign  bodies or soft tissue gas identified. IMPRESSION: 1. Chronic bone changes as discussed above. No acute displaced fractures identified. No acute bone erosion suggested. 2. Extensive soft tissue calcification. No radiopaque foreign bodies or soft tissue gas. Electronically Signed   By: Boyce Byes M.D.   On: 06/20/2023 18:31   - pertinent xrays, CT, MRI studies were reviewed and independently interpreted  Positive ROS: All other systems have been reviewed and were otherwise negative with the exception of those mentioned in the HPI and as above.  Physical Exam: General: Alert, no acute distress Psychiatric: Patient is competent for consent with normal mood and affect Lymphatic: No axillary or cervical lymphadenopathy Cardiovascular: No pedal edema Respiratory: No cyanosis, no use of accessory musculature GI: No organomegaly, abdomen is soft and non-tender    Images:  @ENCIMAGES @  Labs:  Lab Results  Component Value Date   HGBA1C 5.0 05/05/2023   HGBA1C 4.5 (L) 11/21/2021   HGBA1C 4.7 (L) 04/21/2021   ESRSEDRATE 35 (H) 06/21/2023   ESRSEDRATE 74 (H) 05/05/2023   ESRSEDRATE 9 02/27/2022   CRP 1.1 (H) 06/21/2023   CRP 9.9 (H) 05/05/2023   CRP <0.5 02/27/2022   REPTSTATUS 05/27/2023 FINAL 05/22/2023   REPTSTATUS 05/27/2023 FINAL 05/22/2023   GRAMSTAIN  05/05/2023    ABUNDANT WBC PRESENT,BOTH PMN AND MONONUCLEAR NO ORGANISMS SEEN Performed at [facility], [address], [place], [place] [address]    CULT  05/22/2023    NO GROWTH 5 DAYS Performed at [facility], [address], [place], [place] [address]    CULT  05/22/2023    NO GROWTH 5 DAYS Performed at [facility], [address], [place], [place] [address]    [facility] STAPHYLOCOCCUS AUREUS 02/04/2023    Lab Results  Component Value Date   ALBUMIN  2.5 (L) 06/20/2023   ALBUMIN  2.4 (L) 06/16/2023   ALBUMIN  2.2 (L) 05/26/2023   PREALBUMIN 15 (L) 05/11/2023   PREALBUMIN 22 02/27/2022        Latest Ref  Rng & Units 06/21/2023    5:17 AM 06/20/2023    6:23 PM 06/16/2023    9:29 AM  CBC EXTENDED  WBC 4.0 - 10.5 K/uL 8.4  10.2  7.4   RBC 4.22 - 5.81 MIL/uL 3.67  3.54  3.76   Hemoglobin 13.0 - 17.0 g/dL 7.8  7.5  8.4   HCT 29.5 - 52.0 % 27.3  26.1  28.2   Platelets 150 - 400 K/uL 109  102  145   NEUT# 1.7 - 7.7 K/uL  7.1  4.8   Lymph# 0.7 - 4.0 K/uL  1.4  1.3     Neurologic: Patient does not have protective sensation bilateral lower extremities.   MUSCULOSKELETAL:   Skin: Examination patient has a necrotic ulcer of the left heel the skin is thin and atrophic with cellulitis around the wound edges.  Radiographs show extensive soft tissue calcification without gas in the soft tissue.  Patient has chronic osteomyelitis through the forefoot.  Assessment: Assessment: Severe peripheral vascular disease with osteomyelitis and abscess of the left foot.  Plan: Plan: Patient is [name] lift for transfers.  He has a right above-knee amputation.  I have recommended proceeding with an amputation.  Ideally patient would benefit from an above-the-knee amputation however may proceed with a below-knee amputation depending on patient's wishes.  Will plan for surgery on Friday.  Thank you for the consult and the opportunity to see Mr. [name] Brockmeier, MD [facility] [phone] 10:26  AM

## 2023-06-22 NOTE — Progress Notes (Signed)
 PT Cancellation Note  Patient Details Name: [name]. MRN: [id number] DOB: Dec 30, 1970   Cancelled Treatment:    Reason Eval/Treat Not Completed: Patient at procedure or test/unavailable. Pt has been off unit this morning for HD. Will continue to check back as schedule allows to initiate PT evaluation.     Venus Ginsberg 06/22/2023, 12:08 PM  Simone Dubois, PT, DPT Acute Rehabilitation Services Secure Chat Preferred Office: [phone]

## 2023-06-22 NOTE — Progress Notes (Signed)
 Subjective: Seen on hemodialysis, currently no complaints   Objective Vital signs in last 24 hours: Vitals:   06/22/23 1100 06/22/23 1130 06/22/23 1137 06/22/23 1150  BP: (!) 190/90 (!) 169/89 (!) 207/88 (!) 186/79  Pulse: 65 67 66 66  Resp: (!) 9 (!) 9 11 13   Temp:   (!) 97.5 F (36.4 C)   TempSrc:   Oral   SpO2: 99% 100% 100% 100%  Weight:    72.8 kg  Height:       Weight change: -0.8 kg  Physical Exam: General: Alert chronically ill adult male NAD Heart: RRR no MRG Lungs: CTA anteriorly nonlabored breathing Abdomen: NABS soft NT ND Extremities: No pedal edema right AKA, left foot bandaged dry clear Dialysis Access: LUE AVF patent on HD    Renal-related home meds: PhosLo  2 AC 3 times daily Coreg  12.5 twice daily Rena-Vite 1 daily Others: Aspirin , Oxy IR, Keppra , Lipitor , amiodarone       OP HD: [facility]  3 hr  edw 77kg,  LUE AVF   Heparin  1000 bolus + 400u/hr Mircera 100 mcg  q 2wks due  4/28 ( was no show)  No vitamin D    Problem/Plan:  Infected wound L foot: hx of R AKA for similar issues. Ortho to be consulted.  Syncope/ hypoglycemia: admitting issues, per pmd Failure to thrive: per pmd ESRD: on HD TTS.  HD today off schedule k4.7.  No HD needed tomorrow Thursday HTN: bp's are a bit high to normal. Cont home meds.  And UF with HD tolerating UF goal so far on HD now Volume: UF goal 3 L tolerating so far,.  Anemia of esrd: Hb 7.8  here, start his ESA 100 mcg subcu q. Thursday tomorrow, transfuse prn.  Secondary hyperparathyroidism: CCa in range, phosphorus 8.5, cont binders w/ meals, calcium  acetate, is not on vitamin D  Nutrition: requesting reg diet, knows to avoid OJ. Have changed to reg diet w/ 1200 cc FR.  Seizure d/o - on keppra  DM2 - per pmd  Charlynne Coombes, PA-C [facility] Beeper [phone] 06/22/2023,11:59 AM  LOS: 1 day   Labs: Basic Metabolic Panel: Recent Labs  Lab 06/20/23 1823 06/21/23 0517 06/22/23 0821   NA 141 140 143  K 4.6 4.7 5.4*  CL 109 110 112*  CO2 19* 17* 14*  GLUCOSE 96 133* 59*  BUN 75* 78* 87*  CREATININE 11.16* 11.55* 12.51*  CALCIUM  8.7* 8.6* 8.5*  PHOS  --  8.5*  --    Liver Function Tests: Recent Labs  Lab 06/16/23 0929 06/20/23 1823 06/22/23 0821  AST 15 9* 8*  ALT 20 12 11   ALKPHOS 85 74 62  BILITOT 1.0 0.8 0.7  PROT 6.2* 6.0* 6.0*  ALBUMIN  2.4* 2.5* 2.3*   Recent Labs  Lab 06/16/23 0929  LIPASE 26   No results for input(s): "AMMONIA" in the last 168 hours. CBC: Recent Labs  Lab 06/16/23 0929 06/20/23 1823 06/21/23 0517  WBC 7.4 10.2 8.4  NEUTROABS 4.8 7.1  --   HGB 8.4* 7.5* 7.8*  HCT 28.2* 26.1* 27.3*  MCV 75.0* 73.7* 74.4*  PLT 145* 102* 109*   Cardiac Enzymes: Recent Labs  Lab 06/16/23 0929  CKTOTAL 128   CBG: Recent Labs  Lab 06/20/23 1700 06/21/23 0809 06/21/23 2346 06/22/23 0521 06/22/23 0617  GLUCAP 113* 132* 79 66* 84    Studies/Results: DG Foot Complete Left Result Date: 06/20/2023 CLINICAL DATA:  Wound check. Abscess. Wound to the bottom of the  left foot. EXAM: LEFT FOOT - COMPLETE 3+ VIEW COMPARISON:  MRI left foot 04/04/2023. Left foot radiographs 05/25/2022 FINDINGS: Previous amputation of the left first toe at the metatarsal-phalangeal level. Postoperative changes versus resorption at the articular surface of the second metatarsal-phalangeal joint. Similar appearance to previous study. Diffuse bone demineralization. Degenerative changes in the interphalangeal and intertarsal joints. No evidence of acute fracture or dislocation. No focal bone lesion or new bone erosion. Extensive vascular calcifications. No radiopaque soft tissue foreign bodies or soft tissue gas identified. IMPRESSION: 1. Chronic bone changes as discussed above. No acute displaced fractures identified. No acute bone erosion suggested. 2. Extensive soft tissue calcification. No radiopaque foreign bodies or soft tissue gas. Electronically Signed   By:  Boyce Byes M.D.   On: 06/20/2023 18:31   Medications:  anticoagulant sodium citrate      cefTRIAXone  (ROCEPHIN )  IV 2 g (06/22/23 0137)   metronidazole  500 mg (06/22/23 0247)   vancomycin  750 mg (06/21/23 2110)    acidophilus  2 capsule Oral TID   amiodarone   200 mg Oral Daily   aspirin  EC  81 mg Oral Q breakfast   atorvastatin   80 mg Oral QHS   calcium  acetate  1,334 mg Oral TID WC   carvedilol   25 mg Oral BID   Chlorhexidine  Gluconate Cloth  6 each Topical Q0600   feeding supplement (NEPRO CARB STEADY)  237 mL Oral TID BM   heparin   5,000 Units Subcutaneous Q8H   levETIRAcetam   500 mg Oral Once per day on Sunday Tuesday Thursday Saturday   levETIRAcetam   750 mg Oral Once per day on Monday Wednesday Friday   liver oil-zinc  oxide   Topical BID   multivitamin  1 tablet Oral QHS   sodium chloride  flush  3 mL Intravenous Q12H   vancomycin   125 mg Oral QID

## 2023-06-22 NOTE — Progress Notes (Signed)
 Progress Note   Patient: [name] Schild Jr. WUJ:[id number] DOB: 19-Dec-1970 DOA: 06/20/2023     1 DOS: the patient was seen and examined on 06/22/2023   Brief hospital course: 53 y.o. 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 Incidentally noted to have infected ischemic wound of the left foot.   Assessment and Plan: Syncopal episode  w Hypoglycemic episode  -Reportedly slumped over and unconscious in PCP office. CBG 60 at the time;  - He is not on DM medications, does have poor oral intake.  - decreased intake + lightheadedness, despite ESRD may be slightly volume down.  - Glycemic trends stable thus far   Infected ischemic ulcer of the left heel: -History of gangrenous wounds on the right and has ultimately had a right AKA.  - On the left she has developed an ischemic wound over the heel which has been dry.   -  Over the past 3 days wife has noted now draining purulent material (she has picture of gross purulence from the proximal aspect of the wound on her phone).  Otherwise no systemic signs of infection.   -Orthopedic Surgery consulted. Recs noted for RLE amputation later this week  - Started on vancomycin  pharmacy to dose, ceftriaxone  2 g IV every 24 hour, Flagyl  500 mg IV every 12 hours.  -Wound care was consulted   Failure to thrive Progressive weakness now wheelchair-bound and using a Hoyer; despite recent SNF stay. diminished oral intake only taking 1 meal per day. -Nutrition consulted -PT/OT -palliative consulted    Chronic myocardial injury No history of chest pain.   -EKG does have ST depression and T wave inversion laterally, T wave inversion inferiorly.   - High-sensitivity troponin low level elevated and flat 28 -> 30 which is consistent with his chronic troponin.  Likely in the setting of underlying CAD, ESRD -Continued management for his chronic CAD per below   Sacral wound WOC consulted per above   Diarrhea with Cdiff and norovirus present on admission - GI pathogen pos for norovirus.  -Cdif antigen pos, toxin neg. C diff PCR pos - Now on PO vanc   Chronic medical problems:   ESRD,Tu/Th/Sat HD:  -Nephroogy consulted for HD   CAD history STEMI / Hx CVA: Continue home aspirin , atorvastatin  80 mg,  Coreg  12.5 mg twice daily   HFpEF: Without acute exacerbation.  Not on diuretics   Paroxysmal A-fib off anticoagulation: Currently in sinus rhythm, not on anticoagulation.  Continue home amiodarone , coreg  per above.    Hx IPH: Noted, off AC.    Seizure disorder: Continue home Keppra  500 mg nondialysis days, 750 mg on HD days.   DM type II diet controlled: Recent hypoglycemia, not on diabetic medications.  Every 6hr CBG checks, hypoglycemia protocol  -Glucose now stable   Hypertension: Currently uncontrolled, continue home antihypertensives per above Chronic anemia: Hx anemia CKD, defer to nephro re: Epo   Enucleation OS, hx DM retinopathy, glaucoma: Noted, ophtho f/u OP.      Subjective: Seen on HD. Without complaints  Physical Exam: Vitals:   06/22/23 0900  06/22/23 0930 06/22/23 1000 06/22/23 1030  BP: (!) 197/81 (!) 180/80 (!) 186/83 (!) 188/78  Pulse: 61 61 63 63  Resp: 11 (!) 22 (!) 9 12  Temp:      TempSrc:      SpO2: 100% 99% 100% 100%  Weight:      Height:       General exam: Awake, laying in bed, in nad Respiratory system: Normal respiratory effort, no wheezing Cardiovascular system: regular rate, s1, s2 Gastrointestinal system: Soft, nondistended, positive  BS Central nervous system: CN2-12 grossly intact, strength intact Extremities: Perfused, no clubbing Skin: Normal skin turgor, no notable skin lesions seen Psychiatry: Mood normal // no visual hallucinations   Data Reviewed:  Results are pending, will review when available.  Family Communication: Pt in room, family not at bedside  Disposition: Status is: Inpatient Remains inpatient appropriate because: severity of illness  Planned Discharge Destination:  Pending PT eval    Author: Cherylle Corwin, MD 06/22/2023 10:43 AM  For on call review [email or web address].

## 2023-06-22 NOTE — Progress Notes (Addendum)
 Received patient in bed to unit.  Alert and oriented.  Informed consent signed and in chart.   TX duration: 3 hours  Informed Nurse of High BP and that I gave Hydralazine .    Patient complaint of blurred vision, informed Dr. Joan Mouton.  Patient tolerated well.  Transported back to the room  Alert, without acute distress.  Hand-off given to patient's nurse.   Access used: R AV Fistula Upper Arm Access issues: none  Total UF removed: 3L Medication(s) given: Keppra , PO Vancomycin , Tylenol , Hydralazine    06/22/23 1137  Vitals  Temp (!) 97.5 F (36.4 C)  Temp Source Oral  BP (!) 207/88  BP Location Right Arm  BP Method Automatic  Patient Position (if appropriate) Lying  Pulse Rate 66  Pulse Rate Source Monitor  ECG Heart Rate 68  Resp 11  Oxygen Therapy  SpO2 100 %  O2 Device Room Air  During Treatment Monitoring  Duration of HD Treatment -hour(s) 3 hour(s)  HD Safety Checks Performed Yes  Intra-Hemodialysis Comments Tx completed  Dialysis Fluid Bolus Normal Saline  Bolus Amount (mL) 300 mL  Post Treatment  Dialyzer Clearance Clear  Liters Processed 72  Fluid Removed (mL) 2000 mL  Tolerated HD Treatment Yes  AVG/AVF Arterial Site Held (minutes) 7 minutes  AVG/AVF Venous Site Held (minutes) 7 minutes  Fistula / Graft Left Upper arm  No placement date or time found.   Orientation: Left  Access Location: Upper arm  Status Deaccessed     Luciano Ruths LPN [facility]

## 2023-06-22 NOTE — Telephone Encounter (Signed)
 Patient Product/process development scientist completed.    The patient is insured through [employer]. Patient has Medicare and is not eligible for a copay card, but may be able to apply for patient assistance or Medicare RX Payment Plan (Patient Must reach out to their plan, if eligible for payment plan), if available.    Ran test claim for vancomycin  125 mg capsules and the current 10 day co-pay is $90.30.   This test claim was processed through [facility]- copay amounts may vary at other pharmacies due to pharmacy/plan contracts, or as the patient moves through the different stages of their insurance plan.     Morgan Arab, CPHT Pharmacy Technician III Certified Patient Advocate [facility] Patient Advocate Team Direct Number: [phone]  Fax: [phone]

## 2023-06-23 DIAGNOSIS — R627 Adult failure to thrive: Secondary | ICD-10-CM | POA: Diagnosis not present

## 2023-06-23 DIAGNOSIS — R630 Anorexia: Secondary | ICD-10-CM | POA: Diagnosis not present

## 2023-06-23 LAB — CBC
HCT: 25.8 % — ABNORMAL LOW (ref 39.0–52.0)
Hemoglobin: 7.9 g/dL — ABNORMAL LOW (ref 13.0–17.0)
MCH: 21.6 pg — ABNORMAL LOW (ref 26.0–34.0)
MCHC: 30.6 g/dL (ref 30.0–36.0)
MCV: 70.7 fL — ABNORMAL LOW (ref 80.0–100.0)
Platelets: 113 10*3/uL — ABNORMAL LOW (ref 150–400)
RBC: 3.65 MIL/uL — ABNORMAL LOW (ref 4.22–5.81)
RDW: 24.3 % — ABNORMAL HIGH (ref 11.5–15.5)
WBC: 7.9 10*3/uL (ref 4.0–10.5)
nRBC: 0 % (ref 0.0–0.2)

## 2023-06-23 LAB — COMPREHENSIVE METABOLIC PANEL WITH GFR
ALT: 11 U/L (ref 0–44)
AST: 11 U/L — ABNORMAL LOW (ref 15–41)
Albumin: 2.4 g/dL — ABNORMAL LOW (ref 3.5–5.0)
Alkaline Phosphatase: 62 U/L (ref 38–126)
Anion gap: 15 (ref 5–15)
BUN: 53 mg/dL — ABNORMAL HIGH (ref 6–20)
CO2: 22 mmol/L (ref 22–32)
Calcium: 8.8 mg/dL — ABNORMAL LOW (ref 8.9–10.3)
Chloride: 103 mmol/L (ref 98–111)
Creatinine, Ser: 9.15 mg/dL — ABNORMAL HIGH (ref 0.61–1.24)
GFR, Estimated: 6 mL/min — ABNORMAL LOW (ref >60)
Glucose, Bld: 88 mg/dL (ref 70–99)
Potassium: 4.5 mmol/L (ref 3.5–5.1)
Sodium: 140 mmol/L (ref 135–145)
Total Bilirubin: 0.7 mg/dL (ref 0.0–1.2)
Total Protein: 6 g/dL — ABNORMAL LOW (ref 6.5–8.1)

## 2023-06-23 LAB — GLUCOSE, CAPILLARY
Glucose-Capillary: 97 mg/dL (ref 70–99)
Glucose-Capillary: 129 mg/dL — ABNORMAL HIGH (ref 70–99)

## 2023-06-23 NOTE — Evaluation (Signed)
 Occupational Therapy Evaluation Patient Details Name: [name]. MRN: [id number] DOB: 07-03-1970 Today's Date: 06/23/2023   History of Present Illness   Pt is a 53 yr old male who was at PCP and had a syncope episode and an infected L foot wound and went to [facility] then was transferred to [facility]. Pt noted to be + for Cdiff.  PMHx: ESRD on HD TTS, R BKA (Dr. Julio Ohm), CVA, atrial fibrillation not on anticoagulation, DM2, HTN, and seizure disorder.     Clinical Impressions [name] pain to bottom 10/10, states he does not want to use bathroom due to fear of pain. Pt lives at home with wife who works during the day, PLOF bed bound, wife assists with all ADLs, able to complete BUE gross ADLs, has difficulty using forks/spoons, able to finger feed. Pt reports he does not eat during the day while wife is at work due to not wanting to soil himself in bed, eats 1X/day, not a big meal. Pt currently close to baseline, pain limits participation in OOB activities. Pt repositioned in bed to place pillow under L side to relieve pressure from bottom. Pt would benefit greatly from continued acute OT to maximize participation and OOB activities, recommending postacute rehab <3hrs/day to improve functional independence and tolerating OOB activities. Pt has hospital bed with air mattress, hoyer lift, manual and power w/c, but power w/c is too small and [name] is trying to sell it as they cannot return it.      If plan is discharge home, recommend the following:   Two people to help with walking and/or transfers;A lot of help with bathing/dressing/bathroom;Assistance with cooking/housework;Assist for transportation;Help with stairs or ramp for entrance     Functional Status Assessment   Patient has had a recent decline in their functional status and demonstrates the ability to make significant improvements in function in a reasonable and predictable amount of time.     Equipment Recommendations   None  recommended by OT     Recommendations for Other Services         Precautions/Restrictions   Precautions Precautions: Fall Recall of Precautions/Restrictions: Intact Precaution/Restrictions Comments: R AKA Restrictions Weight Bearing Restrictions Per Provider Order: Yes LLE Weight Bearing Per Provider Order: Non weight bearing     Mobility Bed Mobility Overal bed mobility: Needs Assistance Bed Mobility: Rolling Rolling: Mod assist, Used rails         General bed mobility comments: mod A for rolling L/R using bed rails    Transfers Overall transfer level: Needs assistance                 General transfer comment: declined, uses hoyer at home      Balance Overall balance assessment: Needs assistance     Sitting balance - Comments: declined due to 10/10 pain at bottom                                   ADL either performed or assessed with clinical judgement   ADL Overall ADL's : Needs assistance/impaired Eating/Feeding: Minimal assistance;Bed level   Grooming: Set up;Minimal assistance;Bed level   Upper Body Bathing: Maximal assistance;Bed level   Lower Body Bathing: Total assistance;Bed level   Upper Body Dressing : Maximal assistance;Bed level   Lower Body Dressing: Total assistance;Bed level       Toileting- Clothing Manipulation and Hygiene: Maximal assistance;Bed level  General ADL Comments: Pt has been bedbound since December, rarely gets OOB, air mattress at home, wife assists. Pt has poor FM skills, history of stroke with L side deficits, legally blind, limiting participation with ADLs. Pt able to complete hand feeding, not able to use spoons/forks.     Vision Baseline Vision/History: 2 Legally blind Ability to See in Adequate Light: 3 Highly impaired Patient Visual Report: Blurring of vision       Perception         Praxis         Pertinent Vitals/Pain Pain Assessment Pain Assessment: 0-10 Pain  Score: 10-Worst pain ever Pain Location: bottom, L foot Pain Descriptors / Indicators: Aching, Burning, Constant Pain Intervention(s): Monitored during session, Limited activity within patient's tolerance     Extremity/Trunk Assessment Upper Extremity Assessment Upper Extremity Assessment: RUE deficits/detail;LUE deficits/detail RUE Deficits / Details: 4th digit amputation, difficulty with FM skills, needs assist with feeding. RUE Coordination: decreased fine motor LUE Deficits / Details: 3rd digit DIP amputation LUE Coordination: decreased fine motor   Lower Extremity Assessment Lower Extremity Assessment: Defer to PT evaluation       Communication     Cognition Arousal: Alert Behavior During Therapy: [facility] for tasks assessed/performed Cognition: No apparent impairments                               Following commands: Intact       Cueing  General Comments      [name] bottom pain with using bed pan, afraid to use bathroom due to pain. States he does not eat during the day, eats only 1X/day when wife gets off of work due to fear of using the bathroom in bed.   Exercises     Shoulder Instructions      Home Living Family/patient expects to be discharged to:: Private residence Living Arrangements: Spouse/significant other Available Help at Discharge: Family;Available PRN/intermittently Type of Home: House Home Access: Ramped entrance;Stairs to enter     Home Layout: One level     Bathroom Shower/Tub: Walk-in shower;Sponge bathes at baseline   [employer]: Standard     Home Equipment: Rollator (4 wheels);Rolling Walker (2 wheels);BSC/3in1;Hospital bed;Other (comment);Wheelchair - manual;Wheelchair - power (air mattress, [employer])   Additional Comments: Pt lives with wife who works during the day.      Prior Functioning/Environment Prior Level of Function : Needs assist           ADLs (physical): Bathing;Dressing;Toileting;IADLs Mobility  Comments: Pt bed bound, does not get OOB, wife assists at bed level for all needs, has hoyer and manual w/c. Has Power w/c but is too small and they can't return it ADLs Comments: Pt bed bound, wife assists with all ADLs, Pt is afraid to eat during the day because he doesn't want to soil himself in bed, eat 1X/day. Pt has difficulty with FM skills, cannot hold spoons/forks, able to hand feed    OT Problem List: Impaired balance (sitting and/or standing);Pain;Decreased strength;Impaired vision/perception   OT Treatment/Interventions: Self-care/ADL training;Therapeutic exercise;DME and/or AE instruction;Therapeutic activities;Patient/family education;Balance training      OT Goals(Current goals can be found in the care plan section)   Acute Rehab OT Goals Patient Stated Goal: to manage pain OT Goal Formulation: With patient Time For Goal Achievement: 07/07/23 Potential to Achieve Goals: Good   OT Frequency:  Min 1X/week    Co-evaluation  AM-PAC OT "6 Clicks" Daily Activity     Outcome Measure Help from another person eating meals?: A Lot Help from another person taking care of personal grooming?: A Lot Help from another person toileting, which includes using toliet, bedpan, or urinal?: A Lot Help from another person bathing (including washing, rinsing, drying)?: A Lot Help from another person to put on and taking off regular upper body clothing?: A Lot Help from another person to put on and taking off regular lower body clothing?: Total 6 Click Score: 11   End of Session Nurse Communication: Mobility status;Patient requests pain meds  Activity Tolerance: Patient limited by pain Patient left: in bed;with call bell/phone within reach;with bed alarm set  OT Visit Diagnosis: Unsteadiness on feet (R26.81);Muscle weakness (generalized) (M62.81)                Time: 1610-9604 OT Time Calculation (min): 25 min Charges:  OT General Charges $OT Visit: 1 Visit OT  Evaluation $OT Eval Moderate Complexity: 1 Mod OT Treatments $Self Care/Home Management : 8-22 mins  [place], OTR/L   Scherry Curtis 06/23/2023, 12:44 PM

## 2023-06-23 NOTE — Progress Notes (Signed)
 [facility] Progress Note   Subjective:   Reports he is not looking forward to his surgery on Friday. Otherwise doing well, denies SOB, CP, dizziness, nausea.   Objective Vitals:   06/22/23 1709 06/22/23 2140 06/23/23 0616 06/23/23 0950  BP: (!) 176/75 (!) 153/73 (!) 126/47 (!) 112/37  Pulse: 63 64 64 73  Resp: 18 (!) 8 16   Temp: 98.3 F (36.8 C) 98.7 F (37.1 C) 99.3 F (37.4 C) 100.1 F (37.8 C)  TempSrc:  Oral Oral Oral  SpO2: 96% 100% 100% 99%  Weight:      Height:       Physical Exam General: Alert male, eating breakfast with assistance Heart: RRR, no murmurs, rubs or gallops  Lungs: CTA bilaterally, respirations unlabored Abdomen: Soft, non-distended, +BS Extremities: No edema b/l lower extremities Dialysis Access:  LUE AVF + t/b  Additional Objective Labs: Basic Metabolic Panel: Recent Labs  Lab 06/21/23 0517 06/22/23 0821 06/23/23 0526  NA 140 143 140  K 4.7 5.4* 4.5  CL 110 112* 103  CO2 17* 14* 22  GLUCOSE 133* 59* 88  BUN 78* 87* 53*  CREATININE 11.55* 12.51* 9.15*  CALCIUM  8.6* 8.5* 8.8*  PHOS 8.5*  --   --    Liver Function Tests: Recent Labs  Lab 06/20/23 1823 06/22/23 0821 06/23/23 0526  AST 9* 8* 11*  ALT 12 11 11   ALKPHOS 74 62 62  BILITOT 0.8 0.7 0.7  PROT 6.0* 6.0* 6.0*  ALBUMIN  2.5* 2.3* 2.4*   No results for input(s): "LIPASE", "AMYLASE" in the last 168 hours. CBC: Recent Labs  Lab 06/20/23 1823 06/21/23 0517 06/22/23 0821 06/23/23 0526  WBC 10.2 8.4 7.4 7.9  NEUTROABS 7.1  --   --   --   HGB 7.5* 7.8* 7.6* 7.9*  HCT 26.1* 27.3* 25.5* 25.8*  MCV 73.7* 74.4* 72.6* 70.7*  PLT 102* 109* 97* 113*   Blood Culture    Component Value Date/Time   SDES  05/22/2023 1358    RIGHT ANTECUBITAL BOTTLES DRAWN AEROBIC AND ANAEROBIC   SDES  05/22/2023 1358    BLOOD RIGHT HAND BOTTLES DRAWN AEROBIC AND ANAEROBIC   SPECREQUEST Blood Culture adequate volume 05/22/2023 1358   SPECREQUEST Blood Culture adequate volume  05/22/2023 1358   CULT  05/22/2023 1358    NO GROWTH 5 DAYS Performed at [facility], [address] [place], [place] [address]    CULT  05/22/2023 1358    NO GROWTH 5 DAYS Performed at [facility], [address], [place], [place] [address]    REPTSTATUS 05/27/2023 FINAL 05/22/2023 1358   REPTSTATUS 05/27/2023 FINAL 05/22/2023 1358    Cardiac Enzymes: No results for input(s): "CKTOTAL", "CKMB", "CKMBINDEX", "TROPONINI" in the last 168 hours. CBG: Recent Labs  Lab 06/21/23 2346 06/22/23 0521 06/22/23 0617 06/22/23 2358 06/23/23 0523  GLUCAP 79 66* 84 105* 97   Iron Studies: No results for input(s): "IRON", "TIBC", "TRANSFERRIN", "FERRITIN" in the last 72 hours. @lablastinr3 @ Studies/Results: No results found. Medications:  cefTRIAXone  (ROCEPHIN )  IV 2 g (06/23/23 0159)   metronidazole  500 mg (06/23/23 0338)   vancomycin  750 mg (06/21/23 2110)    acidophilus  2 capsule Oral TID   amiodarone   200 mg Oral Daily   aspirin  EC  81 mg Oral Q breakfast   atorvastatin   80 mg Oral QHS   calcium  acetate  1,334 mg Oral TID WC   carvedilol   25 mg Oral BID   Chlorhexidine  Gluconate Cloth  6  each Topical Q0600   darbepoetin (ARANESP ) injection - NON-DIALYSIS  100 mcg Subcutaneous Q Thu-1800   feeding supplement (NEPRO CARB STEADY)  237 mL Oral TID BM   heparin   5,000 Units Subcutaneous Q8H   levETIRAcetam   500 mg Oral Once per day on Sunday Tuesday Thursday Saturday   levETIRAcetam   750 mg Oral Once per day on Monday Wednesday Friday   liver oil-zinc  oxide   Topical BID   multivitamin  1 tablet Oral QHS   sodium chloride  flush  3 mL Intravenous Q12H   vancomycin   125 mg Oral QID    Dialysis Orders: [facility]  3 hr  edw 77kg,  LUE AVF   Heparin  1000 bolus + 400u/hr Mircera 100 mcg  q 2wks due  4/28 ( was no show)  No vitamin D     Assessment/Plan:  Infected wound L foot: hx of R AKA for similar issues. Ortho planning for surgery tomorrow Syncope/  hypoglycemia: admitting issues, per pmd Failure to thrive: per pmd ESRD: on HD TTS.  Had HD off schedule Wednesday. Tentatively plan for next HD tomorrow vs. Saturday depending on labs/surgery schedule HTN: BP well controlled. Appears close to euvolemic Anemia of esrd: Continue weekly aranesp , transfuse PRN Secondary hyperparathyroidism: CCa in range, phosphorus elevated. Continue phosphorus binder with meals.  Nutrition: requesting reg diet, knows to avoid OJ. Have changed to reg diet w/ 1200 cc FR.  Seizure d/o - on keppra  DM2 - per pmd  Ramona Burner, PA-C 06/23/2023, 11:02 AM  [facility] Pager: [phone]

## 2023-06-23 NOTE — Evaluation (Signed)
 Physical Therapy Evaluation Patient Details Name: [name]. MRN: [id number] DOB: 19-Oct-1970 Today's Date: 06/23/2023  History of Present Illness  Pt is a 53 yr old male who was at PCP and had a syncope episode and an infected L foot wound and went to [facility] then was transferred to [facility]. Pt noted to be + for Cdiff.  PMHx: ESRD on HD TTS, R BKA (Dr. Julio Ohm), CVA, atrial fibrillation not on anticoagulation, DM2, HTN, and seizure disorder.  Clinical Impression  Pt is presenting close to his baseline. Pt has a R AKA and states it is healed but he has not been wearing shrinker socks; he states he has prosthesis that he has not picked up yet. Pt states he is hoyer lift at baseline but it is a lot of work for spouse. He stood one time while at rehab in the parallel bars. Pt currently was unable to get to sitting EOB due to pain in the buttocks and was Mod I for rolling. Due to pt current functional status, home set up and available assistance at home recommending skilled physical therapy services < 3 hours/day in order to address strength, balance and functional mobility to decrease risk for falls, injury, immobility, skin break down and re-hospitalization.          If plan is discharge home, recommend the following: Two people to help with walking and/or transfers;Help with stairs or ramp for entrance;Assist for transportation;Assistance with cooking/housework;Supervision due to cognitive status   Can travel by private vehicle   No    Equipment Recommendations Other (comment) (sliding board, drop arm BSC and recline back W/C. Pt needs rails on hospital bed)     Functional Status Assessment Patient has had a recent decline in their functional status and/or demonstrates limited ability to make significant improvements in function in a reasonable and predictable amount of time     Precautions / Restrictions Precautions Precautions: Fall Recall of Precautions/Restrictions:  Intact Precaution/Restrictions Comments: R AKA Restrictions Weight Bearing Restrictions Per Provider Order: Yes LLE Weight Bearing Per Provider Order: Non weight bearing      Mobility  Bed Mobility Overal bed mobility: Needs Assistance Bed Mobility: Rolling, Sidelying to Sit Rolling: Mod assist, Used rails Sidelying to sit: Mod assist       General bed mobility comments: mod A for rolling L/R using bed rails, Attempted to get to sitting, able to get LE to EOB and start to push up with Mod A at trunk. Unable to get fully to sitting due to pain in the buttocks    Transfers     General transfer comment: declined, uses hoyer at home    Ambulation/Gait     General Gait Details: unable    Balance Overall balance assessment: Needs assistance Sitting-balance support: Single extremity supported Sitting balance-Leahy Scale: Poor Sitting balance - Comments: unable to get fully to sitting due to 10/10 pain in buttocks       Standing balance comment: unable         Pertinent Vitals/Pain Pain Assessment Pain Assessment: 0-10 Pain Score: 10-Worst pain ever Pain Location: bottom, L foot Pain Descriptors / Indicators: Aching, Burning, Constant Pain Intervention(s): Monitored during session, Limited activity within patient's tolerance    Home Living Family/patient expects to be discharged to:: Private residence Living Arrangements: Spouse/significant other Available Help at Discharge: Family;Available PRN/intermittently Type of Home: House Home Access: Ramped entrance       Home Layout: One level Home Equipment: Rollator (4 wheels);Rolling Walker (2  wheels);BSC/3in1;Hospital bed;Other (comment);Wheelchair - manual;Wheelchair - power (air mattress, [employer]) Additional Comments: Pt lives with wife who works during the day.    Prior Function Prior Level of Function : Needs assist           ADLs (physical): Bathing;Dressing;Toileting;IADLs Mobility Comments: Pt bed  bound, does not get OOB, wife assists at bed level for all needs, has hoyer and manual w/c. Has Power w/c but is too small and they can't return it ADLs Comments: Pt bed bound, wife assists with all ADLs, Pt is afraid to eat during the day because he doesn't want to soil himself in bed, eat 1X/day. Pt has difficulty with FM skills, cannot hold spoons/forks, able to hand feed     Extremity/Trunk Assessment   Upper Extremity Assessment Upper Extremity Assessment: Defer to OT evaluation RUE Deficits / Details: 4th digit amputation, difficulty with FM skills, needs assist with feeding. RUE Coordination: decreased fine motor LUE Deficits / Details: 3rd digit DIP amputation LUE Coordination: decreased fine motor    Lower Extremity Assessment Lower Extremity Assessment: Generalized weakness;RLE deficits/detail;LLE deficits/detail RLE Deficits / Details: R AKA wrapped with gauze and coban LLE Deficits / Details: L heel wrapped with gauze, noted strength grossly 4-/5 LLE Sensation: decreased light touch    Cervical / Trunk Assessment Cervical / Trunk Assessment: Kyphotic  Communication   Communication Communication: No apparent difficulties    Cognition Arousal: Alert Behavior During Therapy: WFL for tasks assessed/performed   PT - Cognitive impairments: No apparent impairments     Following commands: Intact Following commands impaired: Only follows one step commands consistently, Follows one step commands with increased time     Cueing Cueing Techniques: Verbal cues, Tactile cues     General Comments General comments (skin integrity, edema, etc.): Pt reporting pain in the buttocks. Unable to sit EOB.        Assessment/Plan    PT Assessment Patient needs continued PT services  PT Problem List Decreased strength;Decreased activity tolerance;Decreased balance;Decreased mobility;Decreased knowledge of use of DME;Decreased cognition;Decreased safety awareness;Decreased knowledge  of precautions;Pain;Decreased skin integrity       PT Treatment Interventions DME instruction;Functional mobility training;Therapeutic activities;Therapeutic exercise;Balance training;Patient/family education;Neuromuscular re-education;Cognitive remediation;Wheelchair mobility training;Modalities    PT Goals (Current goals can be found in the Care Plan section)  Acute Rehab PT Goals Patient Stated Goal: Improve strength, independence and hopefully walk with prosthetic PT Goal Formulation: With patient Time For Goal Achievement: 07/07/23 Potential to Achieve Goals: Fair    Frequency Min 1X/week        AM-PAC PT "6 Clicks" Mobility  Outcome Measure Help needed turning from your back to your side while in a flat bed without using bedrails?: A Lot Help needed moving from lying on your back to sitting on the side of a flat bed without using bedrails?: Total Help needed moving to and from a bed to a chair (including a wheelchair)?: Total Help needed standing up from a chair using your arms (e.g., wheelchair or bedside chair)?: Total Help needed to walk in hospital room?: Total Help needed climbing 3-5 steps with a railing? : Total 6 Click Score: 7    End of Session   Activity Tolerance: Patient limited by pain Patient left: in bed;with call bell/phone within reach;with bed alarm set Nurse Communication: Mobility status PT Visit Diagnosis: Other abnormalities of gait and mobility (R26.89);Pain;Muscle weakness (generalized) (M62.81) Pain - Right/Left: Left Pain - part of body: Ankle and joints of foot    Time: 1115-1138 PT  Time Calculation (min) (ACUTE ONLY): 23 min   Charges:   PT Evaluation $PT Eval Low Complexity: 1 Low PT Treatments $Therapeutic Activity: 8-22 mins PT General Charges $$ ACUTE PT VISIT: 1 Visit        Sloan Duncans, DPT, CLT  Acute Rehabilitation Services Office: [phone] (Secure chat preferred)\  Jenice Mitts 06/23/2023, 2:14 PM

## 2023-06-23 NOTE — Progress Notes (Signed)
Pt receives out-pt HD at [facility] on TTS. Will assist as needed.   Melven Sartorius Renal Navigator [phone]

## 2023-06-23 NOTE — Progress Notes (Signed)
 Palliative: Chart review completed.  [name] goals are for amputation 5/2.  He is DNR, but treat the treatable.  Unsure if he would qualify for rehab due to his already low functional status.  Conference with attending related to patient condition, needs, goals of care.  Plan: Continue to treat the treatable but no CPR or intubation.  Plan for amputation ( BKA vs AKA) LLL 5/2.  Outpatient palliative services with [facility], [place]  No charge Arla Lab, NP Palliative Medicine Team  Team Phone [phone]

## 2023-06-23 NOTE — Progress Notes (Signed)
 Progress Note   Patient: [name] Kulig Jr. ZOX:[id number] DOB: 1970/12/02 DOA: 06/20/2023     2 DOS: the patient was seen and examined on 06/23/2023   Brief hospital course: 53 y.o. 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 Incidentally noted to have infected ischemic wound of the left foot.   Assessment and Plan: Syncopal episode  w Hypoglycemic episode  -Reportedly slumped over and unconscious in PCP office. CBG 60 at the time;  - He is not on DM medications, does have poor oral intake.  - decreased intake + lightheadedness, despite ESRD may be slightly volume down.  - Periods of hypoglycemia noted. Cont hypoglycemic protocol as needed   Infected ischemic ulcer of the left heel: -History of gangrenous wounds on the right and has ultimately had a right AKA.  - On the left she has developed an ischemic wound over the heel which has been dry.     -Orthopedic Surgery consulted. Recs noted for RLE amputation later this week  - Started on vancomycin  pharmacy to dose, ceftriaxone  2 g IV every 24 hour, Flagyl  500 mg IV every 12 hours.  -Wound care was consulted this visit   Failure to thrive Progressive weakness now wheelchair-bound and using a Hoyer; despite recent SNF stay. diminished oral intake only taking 1 meal per day. -Nutrition consulted -PT/OT consulted   Chronic myocardial injury No history of chest pain.   -EKG does have ST depression and T wave inversion laterally, T wave inversion inferiorly.   - High-sensitivity  troponin low level elevated and flat 28 -> 30 which is consistent with his chronic troponin.  Likely in the setting of underlying CAD, ESRD -Continued management for his chronic CAD per below   Sacral wound WOC consulted per above   Diarrhea with Cdiff and norovirus present on admission - GI pathogen pos for norovirus.  -Cdif antigen pos, toxin neg. C diff PCR pos - Now on PO vanc -still having diarrhea. Will order rectal tube   Chronic medical problems:   ESRD,Tu/Th/Sat HD:  -Nephroogy consulted for HD   CAD history STEMI / Hx CVA: Continue home aspirin , atorvastatin  80 mg,  Coreg  12.5 mg twice daily   HFpEF: Without acute exacerbation.  Not on diuretics   Paroxysmal A-fib off anticoagulation: Currently in sinus rhythm, not on anticoagulation.  Continue home amiodarone , coreg  per above.    Hx IPH: Noted, off AC.    Seizure disorder: Continue home Keppra  500 mg nondialysis days, 750 mg on HD days.   DM type II diet controlled: Recent hypoglycemia, not on diabetic medications.  Every 6hr CBG checks, hypoglycemia protocol  -Periods of low glucose, cont on dextrose  as needed   Hypertension: Currently uncontrolled, continue home antihypertensives per above Chronic anemia: Hx anemia CKD, defer to nephro re: Epo   Enucleation OS, hx DM retinopathy, glaucoma: Noted, ophtho f/u OP.      Subjective: Still having diarrhea today  Physical Exam: Vitals:   06/22/23 1709 06/22/23 2140 06/23/23 0616 06/23/23 0950  BP: (!) 176/75 (!) 153/73 (!) 126/47 (!) 112/37  Pulse: 63 64  64 73  Resp: 18 (!) 8 16   Temp: 98.3 F (36.8 C) 98.7 F (37.1 C) 99.3 F (37.4 C) 100.1 F (37.8 C)  TempSrc:  Oral Oral Oral  SpO2: 96% 100% 100% 99%  Weight:      Height:       General exam: Conversant, in no acute distress Respiratory system: normal chest rise, clear, no audible wheezing Cardiovascular system: perfused, no notable JVD Gastrointestinal system: Nondistended, nontender Central  nervous system: No seizures, no tremors Extremities: No cyanosis, no joint deformities Skin: No rashes, no pallor Psychiatry: Affect normal // no auditory hallucinations   Data Reviewed:  Results are pending, will review when available.  Family Communication: Pt in room, family not at bedside  Disposition: Status is: Inpatient Remains inpatient appropriate because: severity of illness  Planned Discharge Destination:  Pending PT eval    Author: Cherylle Corwin, MD 06/23/2023 4:58 PM  For on call review [email or web address].

## 2023-06-24 ENCOUNTER — Encounter (HOSPITAL_COMMUNITY)
Admission: EM | Disposition: A | Discharge status: Skilled Nursing Facility | Payer: Self-pay | Source: Ambulatory Visit | Attending: Internal Medicine

## 2023-06-24 ENCOUNTER — Encounter (HOSPITAL_COMMUNITY): Payer: Self-pay | Admitting: Internal Medicine

## 2023-06-24 ENCOUNTER — Inpatient Hospital Stay (HOSPITAL_COMMUNITY): Admitting: Certified Registered Nurse Anesthetist

## 2023-06-24 ENCOUNTER — Other Ambulatory Visit: Payer: Self-pay

## 2023-06-24 DIAGNOSIS — F32A Depression, unspecified: Secondary | ICD-10-CM | POA: Diagnosis not present

## 2023-06-24 DIAGNOSIS — I96 Gangrene, not elsewhere classified: Secondary | ICD-10-CM

## 2023-06-24 DIAGNOSIS — E1152 Type 2 diabetes mellitus with diabetic peripheral angiopathy with gangrene: Secondary | ICD-10-CM

## 2023-06-24 DIAGNOSIS — R630 Anorexia: Secondary | ICD-10-CM | POA: Diagnosis not present

## 2023-06-24 DIAGNOSIS — I2510 Atherosclerotic heart disease of native coronary artery without angina pectoris: Secondary | ICD-10-CM | POA: Diagnosis not present

## 2023-06-24 DIAGNOSIS — N186 End stage renal disease: Secondary | ICD-10-CM

## 2023-06-24 DIAGNOSIS — I120 Hypertensive chronic kidney disease with stage 5 chronic kidney disease or end stage renal disease: Secondary | ICD-10-CM

## 2023-06-24 DIAGNOSIS — T8131XA Disruption of external operation (surgical) wound, not elsewhere classified, initial encounter: Secondary | ICD-10-CM

## 2023-06-24 DIAGNOSIS — I739 Peripheral vascular disease, unspecified: Secondary | ICD-10-CM

## 2023-06-24 DIAGNOSIS — R627 Adult failure to thrive: Secondary | ICD-10-CM | POA: Diagnosis not present

## 2023-06-24 LAB — POCT I-STAT, CHEM 8
BUN: 58 mg/dL — ABNORMAL HIGH (ref 6–20)
Calcium, Ion: 1.06 mmol/L — ABNORMAL LOW (ref 1.15–1.40)
Chloride: 110 mmol/L (ref 98–111)
Creatinine, Ser: 11.4 mg/dL — ABNORMAL HIGH (ref 0.61–1.24)
Glucose, Bld: 67 mg/dL — ABNORMAL LOW (ref 70–99)
HCT: 24 % — ABNORMAL LOW (ref 39.0–52.0)
Hemoglobin: 8.2 g/dL — ABNORMAL LOW (ref 13.0–17.0)
Potassium: 4.8 mmol/L (ref 3.5–5.1)
Sodium: 137 mmol/L (ref 135–145)
TCO2: 18 mmol/L — ABNORMAL LOW (ref 22–32)

## 2023-06-24 LAB — SURGICAL PCR SCREEN
MRSA, PCR: NEGATIVE
Staphylococcus aureus: NEGATIVE

## 2023-06-24 LAB — GLUCOSE, CAPILLARY
Glucose-Capillary: 64 mg/dL — ABNORMAL LOW (ref 70–99)
Glucose-Capillary: 72 mg/dL (ref 70–99)
Glucose-Capillary: 73 mg/dL (ref 70–99)
Glucose-Capillary: 84 mg/dL (ref 70–99)

## 2023-06-24 LAB — TYPE AND SCREEN
ABO/RH(D): A POS
Antibody Screen: NEGATIVE

## 2023-06-24 SURGERY — AMPUTATION, ABOVE KNEE
Anesthesia: General | Site: Knee | Laterality: Left

## 2023-06-24 MED ORDER — ACETAMINOPHEN 10 MG/ML IV SOLN
INTRAVENOUS | Status: AC
  Filled 2023-06-24: qty 100

## 2023-06-24 MED ORDER — PHENYLEPHRINE 80 MCG/ML (10ML) SYRINGE FOR IV PUSH (FOR BLOOD PRESSURE SUPPORT)
PREFILLED_SYRINGE | INTRAVENOUS | Status: AC
Start: 2023-06-24 — End: ?
  Filled 2023-06-24: qty 20

## 2023-06-24 MED ORDER — FENTANYL CITRATE (PF) 250 MCG/5ML IJ SOLN
INTRAMUSCULAR | Status: AC
  Filled 2023-06-24: qty 5

## 2023-06-24 MED ORDER — CHLORHEXIDINE GLUCONATE 4 % EX SOLN: 60.0000 mL | Freq: Once | CUTANEOUS | Status: DC

## 2023-06-24 MED ORDER — ONDANSETRON HCL 4 MG/2ML IJ SOLN
INTRAMUSCULAR | Status: DC | PRN
Start: 2023-06-24 — End: 2023-06-24
  Administered 2023-06-24: 4 mg via INTRAVENOUS

## 2023-06-24 MED ORDER — LIDOCAINE 2% (20 MG/ML) 5 ML SYRINGE
INTRAMUSCULAR | Status: AC
  Filled 2023-06-24: qty 20

## 2023-06-24 MED ORDER — CHLORHEXIDINE GLUCONATE 0.12 % MT SOLN
OROMUCOSAL | Status: AC
  Administered 2023-06-24: 15 mL via OROMUCOSAL
  Filled 2023-06-24: qty 15

## 2023-06-24 MED ORDER — POVIDONE-IODINE 10 % EX SWAB: 2.0000 | Freq: Once | CUTANEOUS | Status: DC

## 2023-06-24 MED ORDER — CHLORHEXIDINE GLUCONATE CLOTH 2 % EX PADS
6.0000 | MEDICATED_PAD | Freq: Every day | CUTANEOUS | Status: DC
Start: 2023-06-24 — End: 2023-06-25

## 2023-06-24 MED ORDER — 0.9 % SODIUM CHLORIDE (POUR BTL) OPTIME
TOPICAL | Status: DC | PRN
  Administered 2023-06-24: 1000 mL

## 2023-06-24 MED ORDER — FENTANYL CITRATE (PF) 250 MCG/5ML IJ SOLN
INTRAMUSCULAR | Status: DC | PRN
  Administered 2023-06-24: 100 ug via INTRAVENOUS

## 2023-06-24 MED ORDER — PROPOFOL 10 MG/ML IV BOLUS
INTRAVENOUS | Status: DC | PRN
  Administered 2023-06-24: 200 mg via INTRAVENOUS

## 2023-06-24 MED ORDER — FENTANYL CITRATE (PF) 100 MCG/2ML IJ SOLN: 25.0000 ug | INTRAMUSCULAR | Status: DC | PRN

## 2023-06-24 MED ORDER — MIDAZOLAM HCL 2 MG/2ML IJ SOLN
INTRAMUSCULAR | Status: AC
  Filled 2023-06-24: qty 2

## 2023-06-24 MED ORDER — CEFAZOLIN SODIUM-DEXTROSE 2-4 GM/100ML-% IV SOLN: 2.0000 g | INTRAVENOUS | Status: DC

## 2023-06-24 MED ORDER — PHENYLEPHRINE HCL-NACL 20-0.9 MG/250ML-% IV SOLN
INTRAVENOUS | Status: DC | PRN
  Administered 2023-06-24: 60 ug/min via INTRAVENOUS

## 2023-06-24 MED ORDER — MIDAZOLAM HCL 2 MG/2ML IJ SOLN
INTRAMUSCULAR | Status: DC | PRN
  Administered 2023-06-24: 1 mg via INTRAVENOUS

## 2023-06-24 MED ORDER — ONDANSETRON HCL 4 MG/2ML IJ SOLN
INTRAMUSCULAR | Status: AC
  Filled 2023-06-24: qty 8

## 2023-06-24 MED ORDER — DEXTROSE 50 % IV SOLN
INTRAVENOUS | Status: AC
  Administered 2023-06-24: 12.5 g via INTRAVENOUS
  Filled 2023-06-24: qty 50

## 2023-06-24 MED ORDER — ORAL CARE MOUTH RINSE: 15.0000 mL | Freq: Once | OROMUCOSAL | Status: AC

## 2023-06-24 MED ORDER — JUVEN PO PACK
1.0000 | PACK | Freq: Two times a day (BID) | ORAL | Status: DC
  Administered 2023-06-25 – 2023-07-08 (×20): 1 via ORAL
  Filled 2023-06-24 (×20): qty 1

## 2023-06-24 MED ORDER — VITAMIN C 500 MG PO TABS
1000.0000 mg | ORAL_TABLET | Freq: Every day | ORAL | Status: DC
  Administered 2023-06-24 – 2023-07-08 (×13): 1000 mg via ORAL
  Filled 2023-06-24 (×13): qty 2

## 2023-06-24 MED ORDER — PROPOFOL 10 MG/ML IV BOLUS
INTRAVENOUS | Status: AC
  Filled 2023-06-24: qty 20

## 2023-06-24 MED ORDER — VASHE WOUND IRRIGATION OPTIME
TOPICAL | Status: DC | PRN
  Administered 2023-06-24: 34 [oz_av]

## 2023-06-24 MED ORDER — SODIUM CHLORIDE 0.9 % IV SOLN: INTRAVENOUS | Status: DC

## 2023-06-24 MED ORDER — OXYCODONE HCL 5 MG/5ML PO SOLN: 5.0000 mg | Freq: Once | ORAL | Status: DC | PRN

## 2023-06-24 MED ORDER — DEXAMETHASONE SODIUM PHOSPHATE 10 MG/ML IJ SOLN
INTRAMUSCULAR | Status: AC
  Filled 2023-06-24: qty 2

## 2023-06-24 MED ORDER — ACETAMINOPHEN 10 MG/ML IV SOLN
INTRAVENOUS | Status: DC | PRN
  Administered 2023-06-24: 1000 mg via INTRAVENOUS

## 2023-06-24 MED ORDER — OXYCODONE HCL 5 MG PO TABS: 5.0000 mg | ORAL_TABLET | Freq: Once | ORAL | Status: DC | PRN

## 2023-06-24 MED ORDER — PHENYLEPHRINE 80 MCG/ML (10ML) SYRINGE FOR IV PUSH (FOR BLOOD PRESSURE SUPPORT)
PREFILLED_SYRINGE | INTRAVENOUS | Status: DC | PRN
  Administered 2023-06-24: 160 ug via INTRAVENOUS

## 2023-06-24 MED ORDER — ZINC SULFATE 220 (50 ZN) MG PO CAPS
220.0000 mg | ORAL_CAPSULE | Freq: Every day | ORAL | Status: AC
  Administered 2023-06-24 – 2023-07-07 (×12): 220 mg via ORAL
  Filled 2023-06-24 (×13): qty 1

## 2023-06-24 MED ORDER — ACETAMINOPHEN 10 MG/ML IV SOLN: 1000.0000 mg | Freq: Once | INTRAVENOUS | Status: DC | PRN

## 2023-06-24 MED ORDER — ROCURONIUM BROMIDE 10 MG/ML (PF) SYRINGE
PREFILLED_SYRINGE | INTRAVENOUS | Status: AC
  Filled 2023-06-24: qty 10

## 2023-06-24 MED ORDER — DEXTROSE 50 % IV SOLN
12.5000 g | INTRAVENOUS | Status: AC
  Administered 2023-06-24: 12.5 g via INTRAVENOUS
  Filled 2023-06-24: qty 50

## 2023-06-24 MED ORDER — DEXTROSE 50 % IV SOLN: 12.5000 g | INTRAVENOUS | Status: AC

## 2023-06-24 MED ORDER — CHLORHEXIDINE GLUCONATE 0.12 % MT SOLN: 15.0000 mL | Freq: Once | OROMUCOSAL | Status: AC

## 2023-06-24 MED ORDER — LIDOCAINE 2% (20 MG/ML) 5 ML SYRINGE
INTRAMUSCULAR | Status: DC | PRN
  Administered 2023-06-24: 40 mg via INTRAVENOUS

## 2023-06-24 SURGICAL SUPPLY — 35 items
BAG COUNTER SPONGE SURGICOUNT (BAG) IMPLANT
BLADE SAW RECIP 87.9 MT (BLADE) ×1 IMPLANT
BLADE SURG 21 STRL SS (BLADE) ×1 IMPLANT
BNDG COHESIVE 6X5 TAN ST LF (GAUZE/BANDAGES/DRESSINGS) ×1 IMPLANT
CANISTER WOUND CARE 500ML ATS (WOUND CARE) IMPLANT
COVER SURGICAL LIGHT HANDLE (MISCELLANEOUS) ×1 IMPLANT
CUFF TRNQT CYL 34X4.125X (TOURNIQUET CUFF) IMPLANT
DRAPE DERMATAC (DRAPES) IMPLANT
DRAPE INCISE IOBAN 66X45 STRL (DRAPES) ×2 IMPLANT
DRAPE U-SHAPE 47X51 STRL (DRAPES) ×1 IMPLANT
DRESSING PREVENA PLUS CUSTOM (GAUZE/BANDAGES/DRESSINGS) ×1 IMPLANT
DURAPREP 26ML APPLICATOR (WOUND CARE) ×1 IMPLANT
ELECTRODE REM PT RTRN 9FT ADLT (ELECTROSURGICAL) ×1 IMPLANT
GLOVE BIOGEL PI IND STRL 7.5 (GLOVE) ×1 IMPLANT
GLOVE BIOGEL PI IND STRL 9 (GLOVE) ×1 IMPLANT
GLOVE SURG ORTHO 9.0 STRL STRW (GLOVE) ×1 IMPLANT
GLOVE SURG SS PI 6.5 STRL IVOR (GLOVE) ×1 IMPLANT
GOWN STRL REUS W/ TWL LRG LVL3 (GOWN DISPOSABLE) ×1 IMPLANT
GOWN STRL REUS W/ TWL XL LVL3 (GOWN DISPOSABLE) ×2 IMPLANT
GRAFT SKIN WND MICRO 38 (Tissue) IMPLANT
KIT BASIN OR (CUSTOM PROCEDURE TRAY) ×1 IMPLANT
KIT TURNOVER KIT B (KITS) ×1 IMPLANT
MANIFOLD NEPTUNE II (INSTRUMENTS) ×1 IMPLANT
NS IRRIG 1000ML POUR BTL (IV SOLUTION) ×1 IMPLANT
PACK ORTHO EXTREMITY (CUSTOM PROCEDURE TRAY) ×1 IMPLANT
PAD ARMBOARD POSITIONER FOAM (MISCELLANEOUS) ×1 IMPLANT
PREVENA RESTOR ARTHOFORM 46X30 (CANNISTER) ×1 IMPLANT
STAPLER VISISTAT 35W (STAPLE) IMPLANT
STOCKINETTE IMPERVIOUS LG (DRAPES) IMPLANT
SUT ETHILON 2 0 PSLX (SUTURE) ×2 IMPLANT
SUT SILK 2-0 18XBRD TIE 12 (SUTURE) ×1 IMPLANT
SUT VIC AB 1 CTX 27 (SUTURE) IMPLANT
TOWEL GREEN STERILE FF (TOWEL DISPOSABLE) ×1 IMPLANT
TUBE CONNECTING 20X1/4 (TUBING) ×1 IMPLANT
YANKAUER SUCT BULB TIP NO VENT (SUCTIONS) ×1 IMPLANT

## 2023-06-24 NOTE — Interval H&P Note (Signed)
 History and Physical Interval Note:  06/24/2023 11:58 AM  [name].  has presented today for surgery, with the diagnosis of Gangrene Left Leg.  The various methods of treatment have been discussed with the patient and family. After consideration of risks, benefits and other options for treatment, the patient has consented to  Procedure(s): AMPUTATION, ABOVE KNEE (Left) as a surgical intervention.  The patient's history has been reviewed, patient examined, no change in status, stable for surgery.  I have reviewed the patient's chart and labs.  Questions were answered to the patient's satisfaction.     [name]

## 2023-06-24 NOTE — Progress Notes (Signed)
 [facility] Progress Note   Subjective:   No concerns today. Planned for surgery later today. Denies SOB, CP, dizziness.   Objective Vitals:   06/23/23 1751 06/23/23 2057 06/24/23 0558 06/24/23 0825  BP: (!) 154/59 (!) 143/57 (!) 136/59 (!) 150/50  Pulse: 64 72 66 65  Resp:  13 15   Temp:  98.6 F (37 C) 98 F (36.7 C) 98.4 F (36.9 C)  TempSrc:  Oral Oral Oral  SpO2: 99% 97% 97% 100%  Weight:      Height:       Physical Exam General: Alert male, NAD Heart: RRR, no murmurs, rubs or gallops  Lungs: CTA bilaterally, respirations unlabored Abdomen: Soft, non-distended, +BS Extremities: No edema b/l lower extremities Dialysis Access:  LUE AVF + t/b    Additional Objective Labs: Basic Metabolic Panel: Recent Labs  Lab 06/21/23 0517 06/22/23 0821 06/23/23 0526  NA 140 143 140  K 4.7 5.4* 4.5  CL 110 112* 103  CO2 17* 14* 22  GLUCOSE 133* 59* 88  BUN 78* 87* 53*  CREATININE 11.55* 12.51* 9.15*  CALCIUM  8.6* 8.5* 8.8*  PHOS 8.5*  --   --    Liver Function Tests: Recent Labs  Lab 06/20/23 1823 06/22/23 0821 06/23/23 0526  AST 9* 8* 11*  ALT 12 11 11   ALKPHOS 74 62 62  BILITOT 0.8 0.7 0.7  PROT 6.0* 6.0* 6.0*  ALBUMIN  2.5* 2.3* 2.4*   No results for input(s): "LIPASE", "AMYLASE" in the last 168 hours. CBC: Recent Labs  Lab 06/20/23 1823 06/21/23 0517 06/22/23 0821 06/23/23 0526  WBC 10.2 8.4 7.4 7.9  NEUTROABS 7.1  --   --   --   HGB 7.5* 7.8* 7.6* 7.9*  HCT 26.1* 27.3* 25.5* 25.8*  MCV 73.7* 74.4* 72.6* 70.7*  PLT 102* 109* 97* 113*   Blood Culture    Component Value Date/Time   SDES  05/22/2023 1358    RIGHT ANTECUBITAL BOTTLES DRAWN AEROBIC AND ANAEROBIC   SDES  05/22/2023 1358    BLOOD RIGHT HAND BOTTLES DRAWN AEROBIC AND ANAEROBIC   SPECREQUEST Blood Culture adequate volume 05/22/2023 1358   SPECREQUEST Blood Culture adequate volume 05/22/2023 1358   CULT  05/22/2023 1358    NO GROWTH 5 DAYS Performed at [facility], [address], [place], [place] [address]    CULT  05/22/2023 1358    NO GROWTH 5 DAYS Performed at [facility], [address], [place], [place] [address]    REPTSTATUS 05/27/2023 FINAL 05/22/2023 1358   REPTSTATUS 05/27/2023 FINAL 05/22/2023 1358    Cardiac Enzymes: No results for input(s): "CKTOTAL", "CKMB", "CKMBINDEX", "TROPONINI" in the last 168 hours. CBG: Recent Labs  Lab 06/22/23 2358 06/23/23 0523 06/23/23 1930 06/24/23 0003 06/24/23 0654  GLUCAP 105* 97 129* 64* 73   Iron Studies: No results for input(s): "IRON", "TIBC", "TRANSFERRIN", "FERRITIN" in the last 72 hours. @lablastinr3 @ Studies/Results: No results found. Medications:  cefTRIAXone  (ROCEPHIN )  IV 2 g (06/24/23 0203)   metronidazole  500 mg (06/24/23 0446)   vancomycin  750 mg (06/23/23 1717)    acidophilus  2 capsule Oral TID   amiodarone   200 mg Oral Daily   aspirin  EC  81 mg Oral Q breakfast   atorvastatin   80 mg Oral QHS   calcium  acetate  1,334 mg Oral TID WC   carvedilol   25 mg Oral BID   Chlorhexidine  Gluconate Cloth  6 each Topical Q0600   darbepoetin (ARANESP ) injection - NON-DIALYSIS  100  mcg Subcutaneous Q Thu-1800   feeding supplement (NEPRO CARB STEADY)  237 mL Oral TID BM   heparin   5,000 Units Subcutaneous Q8H   levETIRAcetam   500 mg Oral Once per day on Sunday Tuesday Thursday Saturday   levETIRAcetam   750 mg Oral Once per day on Monday Wednesday Friday   liver oil-zinc  oxide   Topical BID   multivitamin  1 tablet Oral QHS   sodium chloride  flush  3 mL Intravenous Q12H   vancomycin   125 mg Oral QID    Dialysis Orders: [facility] TTS  3 hr  edw 77kg,  LUE AVF   Heparin  1000 bolus + 400u/hr Mircera 100 mcg  q 2wks due  4/28 ( was no show)  No vitamin D   Assessment/Plan:  Infected wound L foot: hx of R AKA for similar issues. Ortho planning for surgery tomorrow Syncope/ hypoglycemia: admitting issues, per pmd Failure to thrive: per pmd ESRD: on HD TTS.  Had HD  off schedule Wednesday. Surgery still pending today, no signs of volume overload or uremia. Will plan for next HD tomorrow to resume regular schedule HTN: Bp elevated today. Continue home meds. Appears close to euvolemic Anemia of esrd: Hgb 7.9. Continue weekly aranesp , transfuse PRN Secondary hyperparathyroidism: CCa in range, phosphorus elevated. Continue phosphorus binder with meals.  Nutrition: requesting reg diet, knows to avoid OJ. Have changed to reg diet w/ 1200 cc FR.  Seizure d/o - on keppra  DM2 - per pmd  [name] Burner, PA-C 06/24/2023, 10:47 AM  [facility] Pager: [phone]

## 2023-06-24 NOTE — Anesthesia Procedure Notes (Signed)
 Procedure Name: LMA Insertion Date/Time: 06/24/2023 1:42 PM  Performed by: Rochelle Chu, CRNAPre-anesthesia Checklist: Patient identified, Emergency Drugs available, Suction available and Patient being monitored Patient Re-evaluated:Patient Re-evaluated prior to induction Oxygen Delivery Method: Circle system utilized Preoxygenation: Pre-oxygenation with 100% oxygen Induction Type: IV induction Ventilation: Mask ventilation without difficulty LMA: LMA inserted LMA Size: 5.0 Number of attempts: 1 Airway Equipment and Method: Stylet and Oral airway Placement Confirmation: positive ETCO2, breath sounds checked- equal and bilateral and CO2 detector Tube secured with: Tape Dental Injury: Teeth and Oropharynx as per pre-operative assessment

## 2023-06-24 NOTE — Anesthesia Preprocedure Evaluation (Addendum)
 Anesthesia Evaluation  Patient identified by MRN, date of birth, ID band Patient awake    Reviewed: Allergy & Precautions, NPO status , Patient's Chart, lab work & pertinent test results  Airway Mallampati: III  TM Distance: >3 FB     Dental  (+) Dental Advisory Given, Poor Dentition, Chipped,    Pulmonary neg shortness of breath, neg sleep apnea, neg COPD, neg recent URI   breath sounds clear to auscultation       Cardiovascular hypertension, Pt. on medications and Pt. on home beta blockers (-) angina + CAD, + Past MI and + Peripheral Vascular Disease   Rhythm:Regular  1. Left ventricular ejection fraction, by estimation, is 60 to 65%. The  left ventricle has normal function. The left ventricle has no regional  wall motion abnormalities. There is severe left ventricular hypertrophy.  Left ventricular diastolic parameters   are indeterminate. Elevated left ventricular end-diastolic pressure.   2. Right ventricular systolic function is normal. The right ventricular  size is normal. Tricuspid regurgitation signal is inadequate for assessing  PA pressure.   3. Left atrial size was severely dilated.   4. Small pericardial effiusion along the right atrium, right ventricle,  left atrium and lateral to the left ventricle. Moderate pericardial  effusion posterior to the left ventricle.   5. The mitral valve is normal in structure. Trivial mitral valve  regurgitation. No evidence of mitral stenosis.   6. The aortic valve is tricuspid. Aortic valve regurgitation is not  visualized. No aortic stenosis is present.   7. Aortic dilatation noted. There is mild dilatation of the aortic root,  measuring 44 mm.   8. The inferior vena cava is dilated in size with >50% respiratory  variability, suggesting right atrial pressure of 8 mmHg.     Neuro/Psych Seizures -, Well Controlled,   Anxiety     CVA, No Residual Symptoms    GI/Hepatic ,GERD   Controlled,,  Endo/Other  diabetes  Lab Results      Component                Value               Date                      HGBA1C                   5.0                 05/05/2023             Renal/GU ESRF and DialysisRenal diseaseLab Results      Component                Value               Date                      NA                       140                 06/23/2023                K                        4.5  06/23/2023                CO2                      22                  06/23/2023                GLUCOSE                  88                  06/23/2023                BUN                      53 (H)              06/23/2023                CREATININE               9.15 (H)            06/23/2023                CALCIUM                   8.8 (L)             06/23/2023                GFRNONAA                 6 (L)               06/23/2023                Musculoskeletal   Abdominal   Peds  Hematology  (+) Blood dyscrasia, anemia Lab Results      Component                Value               Date                      WBC                      7.9                 06/23/2023                HGB                      7.9 (L)             06/23/2023                HCT                      25.8 (L)            06/23/2023                MCV                      70.7 (L)            06/23/2023  PLT                      113 (L)             06/23/2023              Anesthesia Other Findings   Reproductive/Obstetrics                             Anesthesia Physical Anesthesia Plan  ASA: 3  Anesthesia Plan: General   Post-op Pain Management: Ofirmev  IV (intra-op)*   Induction: Intravenous  PONV Risk Score and Plan: 3 and Ondansetron , Dexamethasone  and Midazolam   Airway Management Planned: LMA and Oral ETT  Additional Equipment: None  Intra-op Plan:   Post-operative Plan: Extubation in OR  Informed Consent: I have  reviewed the patients History and Physical, chart, labs and discussed the procedure including the risks, benefits and alternatives for the proposed anesthesia with the patient or authorized representative who has indicated his/her understanding and acceptance.     Dental advisory given  Plan Discussed with: CRNA  Anesthesia Plan Comments:        Anesthesia Quick Evaluation

## 2023-06-24 NOTE — Op Note (Signed)
 06/24/2023  2:20 PM  PATIENT:  [name] Goupil Jr.    PRE-OPERATIVE DIAGNOSIS:  Gangrene Left Leg  POST-OPERATIVE DIAGNOSIS:  Same  PROCEDURE:  AMPUTATION, ABOVE KNEE, left. Application Kerecis micro graft 38 cm. Application of customizable wound VAC.   SURGEON:  Timothy Ford, MD  PHYSICIAN ASSISTANT:None ANESTHESIA:   General  PREOPERATIVE INDICATIONS:  [name]. is a  53 y.o. male with a diagnosis of Gangrene Left Leg who failed conservative measures and elected for surgical management.    The risks benefits and alternatives were discussed with the patient preoperatively including but not limited to the risks of infection, bleeding, nerve injury, cardiopulmonary complications, the need for revision surgery, among others, and the patient was willing to proceed.  OPERATIVE IMPLANTS:   Implant Name Type Inv. Item Serial No. Manufacturer Lot No. LRB No. Used Action  GRAFT SKIN WND MICRO 38 - [id number] Tissue GRAFT SKIN WND MICRO 38  [employer] [id number] Left 1 Implanted    @ENCIMAGES @  OPERATIVE FINDINGS: Patient had extremely calcified arteries.  Muscle had good color and contractility.  OPERATIVE PROCEDURE: Patient was brought to the operating room and underwent a general anesthetic.  After adequate levels anesthesia were obtained patient's left lower extremity was prepped using DuraPrep draped into a sterile field a timeout was called.  A fishmouth incision was made just proximal to the patella tendon.  This was carried down extra-articular lead to the femur.  A reciprocating saw was used to amputate through the femur.  The amputation was completed the sciatic nerve was pulled cut and allowed to retract.  The vascular bundles were clamped and suture-ligated with 2-0 silk.  After hemostasis was obtained the wound was irrigated with Vashe.  The wound bed of over 200 cm was filled with 38 cm of Kerecis micro graft.  The deep and superficial fascial layers were  closed using #1 Vicryl.  The skin was closed using staples.  The residual limb was circumferentially wrapped with a customizable wound VAC sponge.  This was sealed had a good suction fit patient was extubated taken the PACU in stable condition.   DISCHARGE PLANNING:  Antibiotic duration: 24 hours antibiotics postoperatively  Weightbearing: Transfer training  Pain medication: Continue current pain medication  Dressing care/ Wound VAC: Wound VAC for 1 week  Ambulatory devices: Not applicable  Discharge to: Anticipate discharge to skilled nursing.  Follow-up: In the office 1 week post operative.

## 2023-06-24 NOTE — Progress Notes (Addendum)
 Nutrition Follow-up  DOCUMENTATION CODES:   Non-severe (moderate) malnutrition in context of chronic illness  INTERVENTION:  Continue renal MVI w/ minerals Continue Nepro Shake po TID, each supplement provides 425 kcal and 19 grams protein  Monitor intake s/p surgery to assess need for additional intervention Continue liberalized diet to encourage intake of calories/protein Continue probiotic for gut health/bowel regularity If not resolving, recommend stool bulking agent   NUTRITION DIAGNOSIS:  Moderate Malnutrition related to chronic illness (ESRD-HD, hx R AKA, T2DM, CAD, HTN) as evidenced by mild fat depletion, moderate muscle depletion, percent weight loss. - new dx established s/p physical exam  GOAL:  Patient will meet greater than or equal to 90% of their needs - progressing  MONITOR:  PO intake, Supplement acceptance, Skin, I & O's  REASON FOR ASSESSMENT:  Consult Assessment of nutrition requirement/status  ASSESSMENT:   53 yo male admitted with FTT, infected ischemic wound of the L foot, syncope, hypoglycemia. PMH includes PAD, chronic ischemic L heel ulcer, R AKA, ESRD on HD TTS, CAD, CVA, HF, PAF, seizure D/O, DM-2, HTN, HLD, wheelchair bound, chronic diarrhea, gastroparesis, BPH.  4/29 admitted 5/2 L AKA, wound vac  Pt scheduled for amputation of L AKA  today. Will assess intake s/p surgery and update interventions, if indicated. Noted already with R AKA.  Average Meal Intake 4/30: 100% x2 documented meals 5/1: 100% x2 documented meals  Wife at bedside with patient sleeping and able to report brief nutrition history until visitors arrived. Reports intake has been somewhat diminished prior to admission. Intake has been desirable since admission. Could not obtain 24 hour diet recall. Is requiring some assistance with meal intake. Appears to have refused Nepro x3 yesterday, but with some acceptance prior to that. Meal intake desirable.   Admit Weight: 75kg Current  Weight: 79.4kg EDW: 77kg  Wt with slight trend up since admission. Per chart review, weight stable over last six months. His wife endorses a 10lbs weight loss in last month. This is 5% weight loss and considered clinically significant for the time frame. Likely a combination of true body weight loss and shifts in fluid status. He is anuric at baseline. UF 3L at last dialysis tx on 4/30. Tolerated well.  Drains/Lines: LUE AVF: + bruit/thrill Wound vac (placed 5/2)  Rectal tube  Some diarrhea endorsed. Probiotic in place as well as rectal tube. Appears euvolemic. Corr Ca within range. Calcium -based binder continues. Reiterated importance of phosphorus binders with meals, especially since nephrology modified diet to regular with 1.2L fluid restriction.   Meds: acidophilus, calcium  acetate, darbepoetin alfa , renal MVI, IV ABX  Labs: Na+ 137 (wdl) K+ 4.8 (wdl) PHOS 8.5 (H) CBGs 67-88 x24 hours  NUTRITION - FOCUSED PHYSICAL EXAM:  Flowsheet Row Most Recent Value  Orbital Region Mild depletion  Upper Arm Region Mild depletion  Thoracic and Lumbar Region No depletion  Buccal Region No depletion  Temple Region Mild depletion  Clavicle Bone Region Mild depletion  Clavicle and Acromion Bone Region Mild depletion  Scapular Bone Region Moderate depletion  Dorsal Hand No depletion  Patellar Region Moderate depletion  [could not assess R leg d/t R AKA]  Anterior Thigh Region Moderate depletion  Posterior Calf Region Unable to assess  [unable to assess r/t Unna on LLE and R AKA]  Edema (RD Assessment) Mild  Hair Reviewed  Eyes Reviewed  Mouth Reviewed  Skin Reviewed  Nails Reviewed   Diet Order:   Diet Order  Diet regular Room service appropriate? Yes; Fluid consistency: Thin; Fluid restriction: 1200 mL Fluid  Diet effective now            EDUCATION NEEDS:   Education needs have been addressed  Skin:  Skin Assessment: Skin Integrity Issues: Skin Integrity Issues::  Other (Comment) Other: infected ischemic wound L foot  Last BM:  5/1 - type 7 x2  Height:  Ht Readings from Last 1 Encounters:  06/24/23 6\' 3"  (1.905 m)   Weight:  Wt Readings from Last 1 Encounters:  06/24/23 79.4 kg   Ideal Body Weight:  82 kg  BMI:  Body mass index is 21.87 kg/m.  Estimated Nutritional Needs:   Kcal:  2300-2500  Protein:  110-125 gm  Fluid:  1 L + UOP  Con Decant MS, RD, LDN Registered Dietitian Clinical Nutrition RD Inpatient Contact Info in Amion

## 2023-06-24 NOTE — Transfer of Care (Signed)
 Immediate Anesthesia Transfer of Care Note  Patient: [name] Flemings Jr.  Procedure(s) Performed: AMPUTATION, ABOVE KNEE (Left: Knee)  Patient Location: PACU  Anesthesia Type:General  Level of Consciousness: drowsy  Airway & Oxygen Therapy: Patient Spontanous Breathing and Patient connected to nasal cannula oxygen  Post-op Assessment: Report given to RN and Post -op Vital signs reviewed and stable  Post vital signs: Reviewed and stable  Last Vitals:  Vitals Value Taken Time  BP 149/57 06/24/23 1415  Temp 98   Pulse 62 06/24/23 1416  Resp 10 06/24/23 1416  SpO2 100 % 06/24/23 1416  Vitals shown include unfiled device data.  Last Pain:  Vitals:   06/24/23 1239  TempSrc:   PainSc: 6       Patients Stated Pain Goal: 3 (06/24/23 1239)  Complications: No notable events documented.

## 2023-06-24 NOTE — Progress Notes (Signed)
 Blood sugar 64, hypoglycemia standing orders implemented, pt refused 15 minute recheck.

## 2023-06-24 NOTE — Progress Notes (Signed)
 Progress Note   Patient: [name] Senft Jr. WJX:[id number] DOB: Oct 14, 1970 DOA: 06/20/2023     3 DOS: the patient was seen and examined on 06/24/2023   Brief hospital course: 53 y.o. 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 Incidentally noted to have infected ischemic wound of the left foot.   Assessment and Plan: Syncopal episode  w Hypoglycemic episode  -Reportedly slumped over and unconscious in PCP office. CBG 60 at the time;  - He is not on DM medications, does have poor oral intake.  - decreased intake + lightheadedness, despite ESRD may be slightly volume down.  - Periods of hypoglycemia noted. Cont hypoglycemic protocol as needed   Infected ischemic ulcer of the left heel: -History of gangrenous wounds on the right and has ultimately had a right AKA.  - On the left she has developed an ischemic wound over the heel which has been dry.     -Orthopedic Surgery consulted, now s/p L AKA 5/2 -Pt had been on vancomycin  pharmacy to dose, ceftriaxone  2 g IV every 24 hour, Flagyl  500 mg IV every 12 hours.  -Wound care was consulted this visit   Failure to thrive Progressive weakness now wheelchair-bound and using a Hoyer; despite recent SNF stay. diminished oral intake only taking 1 meal per day. -Nutrition consulted -PT/OT consulted   Chronic myocardial injury No history of chest pain.   -EKG does have ST depression and T wave inversion laterally, T wave inversion inferiorly.   - High-sensitivity troponin low level elevated and  flat 28 -> 30 which is consistent with his chronic troponin.  Likely in the setting of underlying CAD, ESRD -Continued management for his chronic CAD per below   Sacral wound WOC consulted per above   Diarrhea with Cdiff and norovirus present on admission - GI pathogen pos for norovirus.  -Cdif antigen pos, toxin neg. C diff PCR pos - Now on PO vanc -had been having diarrhea. Rectal tube in place. Remove as diarrhea improves   Chronic medical problems:   ESRD,Tu/Th/Sat HD:  -Nephroogy consulted for HD   CAD history STEMI / Hx CVA: Continue home aspirin , atorvastatin  80 mg,  Coreg  12.5 mg twice daily   HFpEF: Without acute exacerbation.  Not on diuretics   Paroxysmal A-fib off anticoagulation: Currently in sinus rhythm, not on anticoagulation.  Continue home amiodarone , coreg  per above.    Hx IPH: Noted, off AC.    Seizure disorder: Continue home Keppra  500 mg nondialysis days, 750 mg on HD days.   DM type II diet controlled: Recent hypoglycemia, not on diabetic medications.  Every 6hr CBG checks, hypoglycemia protocol  -Periods of low glucose, cont on dextrose  as needed   Hypertension: Currently uncontrolled, continue home antihypertensives per above Chronic anemia: Hx anemia CKD, defer to nephro re: Epo   Enucleation OS, hx DM retinopathy, glaucoma: Noted, ophtho f/u OP.      Subjective: Without compliants  Physical Exam: Vitals:   06/24/23 1225 06/24/23 1415 06/24/23 1430 06/24/23 1445  BP: (!) 146/53 (!) 149/57 (!) 141/53 (!) 132/52  Pulse: 64 62  60 (!) 59  Resp: 17 (!) 7 12 12   Temp: 98.4 F (36.9 C) 99.1 F (37.3 C)  99.1 F (37.3 C)  TempSrc: Oral     SpO2: 100% 100% 100% 100%  Weight: 79.4 kg     Height: 6\' 3"  (1.905 m)      General exam: Conversant, in no acute distress Respiratory system: normal chest rise, clear, no audible wheezing Cardiovascular system: regular rhythm, s1-s2 Gastrointestinal system: Nondistended, nontender, pos BS Central nervous  system: No seizures, no tremors Extremities: No cyanosis, no joint deformities Skin: No rashes, no pallor Psychiatry: Affect normal // no auditory hallucinations   Data Reviewed:  Results are pending, will review when available.  Family Communication: Pt in room, family not at bedside  Disposition: Status is: Inpatient Remains inpatient appropriate because: severity of illness  Planned Discharge Destination:  Pending PT eval    Author: Cherylle Corwin, MD 06/24/2023 5:16 PM  For on call review [email or web address].

## 2023-06-24 NOTE — Plan of Care (Signed)
   Problem: Education: Goal: Knowledge of General Education information will improve Description: Including pain rating scale, medication(s)/side effects and non-pharmacologic comfort measures Outcome: Completed/Met

## 2023-06-25 ENCOUNTER — Inpatient Hospital Stay (HOSPITAL_COMMUNITY)

## 2023-06-25 DIAGNOSIS — R630 Anorexia: Secondary | ICD-10-CM | POA: Diagnosis not present

## 2023-06-25 DIAGNOSIS — R627 Adult failure to thrive: Secondary | ICD-10-CM | POA: Diagnosis not present

## 2023-06-25 DIAGNOSIS — F32A Depression, unspecified: Secondary | ICD-10-CM | POA: Diagnosis not present

## 2023-06-25 LAB — GLUCOSE, CAPILLARY: Glucose-Capillary: 87 mg/dL (ref 70–99)

## 2023-06-25 LAB — CBC
HCT: 22.9 % — ABNORMAL LOW (ref 39.0–52.0)
Hemoglobin: 7 g/dL — ABNORMAL LOW (ref 13.0–17.0)
MCH: 21.9 pg — ABNORMAL LOW (ref 26.0–34.0)
MCHC: 30.6 g/dL (ref 30.0–36.0)
MCV: 71.8 fL — ABNORMAL LOW (ref 80.0–100.0)
Platelets: 127 10*3/uL — ABNORMAL LOW (ref 150–400)
RBC: 3.19 MIL/uL — ABNORMAL LOW (ref 4.22–5.81)
RDW: 24.7 % — ABNORMAL HIGH (ref 11.5–15.5)
WBC: 14.4 10*3/uL — ABNORMAL HIGH (ref 4.0–10.5)
nRBC: 0 % (ref 0.0–0.2)

## 2023-06-25 LAB — COMPREHENSIVE METABOLIC PANEL WITH GFR
ALT: 11 U/L (ref 0–44)
AST: 17 U/L (ref 15–41)
Albumin: 2.4 g/dL — ABNORMAL LOW (ref 3.5–5.0)
Alkaline Phosphatase: 55 U/L (ref 38–126)
Anion gap: 18 — ABNORMAL HIGH (ref 5–15)
BUN: 69 mg/dL — ABNORMAL HIGH (ref 6–20)
CO2: 18 mmol/L — ABNORMAL LOW (ref 22–32)
Calcium: 9.5 mg/dL (ref 8.9–10.3)
Chloride: 110 mmol/L (ref 98–111)
Creatinine, Ser: 11.76 mg/dL — ABNORMAL HIGH (ref 0.61–1.24)
GFR, Estimated: 5 mL/min — ABNORMAL LOW (ref >60)
Glucose, Bld: 97 mg/dL (ref 70–99)
Potassium: 4.4 mmol/L (ref 3.5–5.1)
Sodium: 146 mmol/L — ABNORMAL HIGH (ref 135–145)
Total Bilirubin: 0.6 mg/dL (ref 0.0–1.2)
Total Protein: 5.6 g/dL — ABNORMAL LOW (ref 6.5–8.1)

## 2023-06-25 LAB — PHOSPHORUS: Phosphorus: 9.4 mg/dL — ABNORMAL HIGH (ref 2.5–4.6)

## 2023-06-25 MED ORDER — SODIUM CHLORIDE 0.9 % IV BOLUS
500.0000 mL | Freq: Once | INTRAVENOUS | Status: AC
  Administered 2023-06-25: 500 mL via INTRAVENOUS

## 2023-06-25 MED ORDER — PREGABALIN 25 MG PO CAPS
50.0000 mg | ORAL_CAPSULE | Freq: Every day | ORAL | Status: DC
  Administered 2023-06-25 – 2023-07-08 (×12): 50 mg via ORAL
  Filled 2023-06-25 (×12): qty 2

## 2023-06-25 MED ORDER — METRONIDAZOLE 500 MG/100ML IV SOLN
500.0000 mg | Freq: Two times a day (BID) | INTRAVENOUS | Status: AC
  Administered 2023-06-25: 500 mg via INTRAVENOUS

## 2023-06-25 MED ORDER — HYDROMORPHONE HCL 1 MG/ML IJ SOLN
1.0000 mg | INTRAMUSCULAR | Status: DC | PRN
  Administered 2023-06-26 – 2023-07-04 (×8): 1 mg via INTRAVENOUS
  Filled 2023-06-25 (×8): qty 1

## 2023-06-25 NOTE — Evaluation (Signed)
 Occupational Therapy Evaluation Patient Details Name: [name]. MRN: [id number] DOB: 02-16-1971 Today's Date: 06/25/2023   History of Present Illness   Pt is a 53 yr old male presenting 4/30 from PCP with hypoglycemia and general weakness. Pt also with limited PO intake prior to admission. 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 Additionally, pt noted to be + for Cdiff.  PMHx: ESRD on HD TTS, R AKA (Dr. Julio Ohm 2/25), CVA, atrial fibrillation not on anticoagulation, DM2, HTN, and seizure disorder.     Clinical Impressions Pt seen s/p L AKA, pt reports 8/10 pain and declines bed mobility/OOB mobility. Pt educated on desensitization strategies for phantom limb pain, pt able to tolerate HOB 22*. Pt with LUE shoulder deficits, reports difficulty eating but declines assist/eating lunch at this time, states he does not want to have a BM. Pt presenting with impairments listed below, will follow acutely. Patient will benefit from continued inpatient follow up therapy, <3 hours/day to maximize safety/ind with ADL/functional mobility.      If plan is discharge home, recommend the following:   Two people to help with walking and/or transfers;A lot of help with bathing/dressing/bathroom;Assistance with cooking/housework;Assist for transportation;Help with stairs or ramp for entrance     Functional Status Assessment   Patient has had a recent decline in their functional status and demonstrates the ability to make significant improvements in function in a reasonable and predictable amount of time.     Equipment Recommendations   Other (comment) (defer)     Recommendations for Other Services   PT consult     Precautions/Restrictions   Precautions Precautions: Fall Recall of Precautions/Restrictions: Intact Precaution/Restrictions Comments: bilateral AKA, L LE wound vac Restrictions Weight Bearing Restrictions Per Provider Order: Yes LLE Weight Bearing Per  Provider Order: Non weight bearing     Mobility Bed Mobility               General bed mobility comments: today required bed support to sit up, pt generally resistant to any change in position. refused to complete rolling or sit HOB above 20 deg. anticipate max-totalA to complete mobility    Transfers                   General transfer comment: pt refused      Balance Overall balance assessment: Needs assistance Sitting-balance support: Single extremity supported Sitting balance-Leahy Scale: Poor                                     ADL either performed or assessed with clinical judgement   ADL Overall ADL's : Needs assistance/impaired Eating/Feeding: Minimal assistance;Bed level   Grooming: Minimal assistance;Bed level   Upper Body Bathing: Moderate assistance   Lower Body Bathing: Maximal assistance   Upper Body Dressing : Moderate assistance   Lower Body Dressing: Maximal assistance   Toilet Transfer: Maximal assistance;+2 for physical assistance   Toileting- Clothing Manipulation and Hygiene: Maximal assistance;+2 for physical assistance       Functional mobility during ADLs: Maximal assistance;+2 for physical assistance       Vision Patient Visual Report: Blurring of vision Additional Comments: no L eye     Perception Perception: Not tested       Praxis Praxis: Not tested       Pertinent Vitals/Pain Pain Assessment Pain Assessment: Faces Pain Score: 9  Faces Pain Scale: Hurts  whole lot Pain Location: bilateral legs, reports phantom itching LLE. pain in LLE limiting any movement Pain Descriptors / Indicators: Aching, Burning, Constant Pain Intervention(s): Limited activity within patient's tolerance, Monitored during session, Repositioned     Extremity/Trunk Assessment Upper Extremity Assessment Upper Extremity Assessment: Defer to OT evaluation RUE Deficits / Details: 4th digit amputation, difficulty with FM  skills, needs assist with feeding. RUE Coordination: decreased fine motor LUE Deficits / Details: 3rd digit DIP amputation, limited shoulder flexion in supine LUE Coordination: decreased fine motor   Lower Extremity Assessment Lower Extremity Assessment: Defer to PT evaluation RLE Deficits / Details: chronic R AKA wrapped, pt able to demo good ROM against gravity, reports sensation intact, did not allow further MMT RLE: Unable to fully assess due to pain RLE Sensation: WNL LLE Deficits / Details: new AKA, wound vac in place. limited assessment as pt refusing mobility due to pain. reports sensation intact, does have phantom itching. able to demo some hip flexion LLE: Unable to fully assess due to pain LLE Sensation: WNL   Cervical / Trunk Assessment Cervical / Trunk Assessment: Kyphotic   Communication Communication Communication: No apparent difficulties   Cognition Arousal: Alert Behavior During Therapy: Flat affect                                 Following commands: Impaired Following commands impaired: Only follows one step commands consistently     Cueing  General Comments   Cueing Techniques: Verbal cues;Tactile cues  VSS   Exercises     Shoulder Instructions      Home Living Family/patient expects to be discharged to:: Private residence Living Arrangements: Spouse/significant other Available Help at Discharge: Family;Available PRN/intermittently Type of Home: House Home Access: Ramped entrance     Home Layout: One level     Bathroom Shower/Tub: Walk-in shower;Sponge bathes at baseline   [employer]: Standard     Home Equipment: Rollator (4 wheels);Rolling Walker (2 wheels);BSC/3in1;Hospital bed;Other (comment);Wheelchair - manual;Wheelchair - power (air mattress, hoyer lift)   Additional Comments: Pt lives with wife who works during the day.      Prior Functioning/Environment Prior Level of Function : Needs assist            ADLs (physical): Bathing;Dressing;Toileting;IADLs Mobility Comments: Pt bed bound, does not get OOB, wife assists at bed level for all needs, has hoyer and manual w/c. Has Power w/c but is too small and they can't return it ADLs Comments: Pt bed bound, wife assists with all ADLs, Pt is afraid to eat during the day because he doesn't want to soil himself in bed, eat 1X/day. Pt has difficulty with FM skills, cannot hold spoons/forks, able to hand feed    OT Problem List: Impaired balance (sitting and/or standing);Pain;Decreased strength;Impaired vision/perception;Decreased activity tolerance;Decreased range of motion;Decreased safety awareness;Impaired UE functional use   OT Treatment/Interventions: Self-care/ADL training;Therapeutic exercise;DME and/or AE instruction;Therapeutic activities;Patient/family education;Balance training      OT Goals(Current goals can be found in the care plan section)   Acute Rehab OT Goals Patient Stated Goal: none stated OT Goal Formulation: With patient Time For Goal Achievement: 07/07/23 Potential to Achieve Goals: Good ADL Goals Pt Will Perform Eating: with supervision;with adaptive utensils;sitting Pt/caregiver will Perform Home Exercise Program: Increased ROM;Increased strength;Both right and left upper extremity;With Supervision;With written HEP provided   OT Frequency:  Min 1X/week    Co-evaluation PT/OT/SLP Co-Evaluation/Treatment: Yes Reason for Co-Treatment: For patient/therapist safety;To  address functional/ADL transfers PT goals addressed during session: Mobility/safety with mobility;Balance;Strengthening/ROM OT goals addressed during session: ADL's and self-care;Strengthening/ROM      AM-PAC OT "6 Clicks" Daily Activity     Outcome Measure Help from another person eating meals?: A Little Help from another person taking care of personal grooming?: A Little Help from another person toileting, which includes using toliet, bedpan, or  urinal?: A Lot Help from another person bathing (including washing, rinsing, drying)?: A Lot Help from another person to put on and taking off regular upper body clothing?: A Lot Help from another person to put on and taking off regular lower body clothing?: A Lot 6 Click Score: 14   End of Session Nurse Communication: Mobility status;Patient requests pain meds  Activity Tolerance: Patient limited by pain Patient left: in bed;with call bell/phone within reach;with bed alarm set  OT Visit Diagnosis: Unsteadiness on feet (R26.81);Muscle weakness (generalized) (M62.81)                Time: 1610-9604 OT Time Calculation (min): 21 min Charges:  OT General Charges $OT Visit: 1 Visit OT Evaluation $OT Eval Moderate Complexity: 1 Mod  Analia Zuk K, OTD, OTR/L SecureChat Preferred Acute Rehab [phone]   Benedict Brain Koonce 06/25/2023, 12:33 PM

## 2023-06-25 NOTE — Progress Notes (Signed)
 Pt. Refusing to go to dialysis today. Dr. Joan Mouton and Dr. Zana Hesselbach made aware.

## 2023-06-25 NOTE — Progress Notes (Signed)
 [facility] Progress Note   Subjective:   Noted soft BP today. Pt refused dialysis, reports his buttocks is sore and he is having diarrhea. He is aware of risks. Looks a bit dry and Na 146, ok with 500ml NS bolus.   Objective Vitals:   06/24/23 1935 06/25/23 0607 06/25/23 0842 06/25/23 0900  BP: (!) 112/47 (!) 131/35  (!) 83/28  Pulse: 70 73  82  Resp: 18 17 14 18   Temp: 97.9 F (36.6 C) (!) 100.7 F (38.2 C)  99.3 F (37.4 C)  TempSrc: Oral Oral  Oral  SpO2: 100% 98%  98%  Weight:      Height:       Physical Exam General: Alert male, laying flat, NAD Heart: RRR, no murmurs, rubs or gallops Lungs: CTA bilaterally, respirations unlabored Abdomen: Non-distended, +BS Extremities: no edema b/l lower extremities Dialysis Access:  LUE AVF +t/b  Additional Objective Labs: Basic Metabolic Panel: Recent Labs  Lab 06/21/23 0517 06/22/23 0821 06/23/23 0526 06/24/23 1305 06/25/23 0733  NA 140 143 140 137 146*  K 4.7 5.4* 4.5 4.8 4.4  CL 110 112* 103 110 110  CO2 17* 14* 22  --  18*  GLUCOSE 133* 59* 88 67* 97  BUN 78* 87* 53* 58* 69*  CREATININE 11.55* 12.51* 9.15* 11.40* 11.76*  CALCIUM  8.6* 8.5* 8.8*  --  9.5  PHOS 8.5*  --   --   --  9.4*   Liver Function Tests: Recent Labs  Lab 06/22/23 0821 06/23/23 0526 06/25/23 0733  AST 8* 11* 17  ALT 11 11 11   ALKPHOS 62 62 55  BILITOT 0.7 0.7 0.6  PROT 6.0* 6.0* 5.6*  ALBUMIN  2.3* 2.4* 2.4*   No results for input(s): "LIPASE", "AMYLASE" in the last 168 hours. CBC: Recent Labs  Lab 06/20/23 1823 06/21/23 0517 06/22/23 0821 06/23/23 0526 06/24/23 1305 06/25/23 0726  WBC 10.2 8.4 7.4 7.9  --  14.4*  NEUTROABS 7.1  --   --   --   --   --   HGB 7.5* 7.8* 7.6* 7.9* 8.2* 7.0*  HCT 26.1* 27.3* 25.5* 25.8* 24.0* 22.9*  MCV 73.7* 74.4* 72.6* 70.7*  --  71.8*  PLT 102* 109* 97* 113*  --  127*   Blood Culture    Component Value Date/Time   SDES  05/22/2023 1358    RIGHT ANTECUBITAL BOTTLES DRAWN  AEROBIC AND ANAEROBIC   SDES  05/22/2023 1358    BLOOD RIGHT HAND BOTTLES DRAWN AEROBIC AND ANAEROBIC   SPECREQUEST Blood Culture adequate volume 05/22/2023 1358   SPECREQUEST Blood Culture adequate volume 05/22/2023 1358   CULT  05/22/2023 1358    NO GROWTH 5 DAYS Performed at [facility], [address] [place], [place] [address]    CULT  05/22/2023 1358    NO GROWTH 5 DAYS Performed at [facility], [address], [place], [place] [address]    REPTSTATUS 05/27/2023 FINAL 05/22/2023 1358   REPTSTATUS 05/27/2023 FINAL 05/22/2023 1358    Cardiac Enzymes: No results for input(s): "CKTOTAL", "CKMB", "CKMBINDEX", "TROPONINI" in the last 168 hours. CBG: Recent Labs  Lab 06/23/23 1930 06/24/23 0003 06/24/23 0654 06/24/23 1056 06/24/23 1418  GLUCAP 129* 64* 73 72 84   Iron Studies: No results for input(s): "IRON", "TIBC", "TRANSFERRIN", "FERRITIN" in the last 72 hours. @lablastinr3 @ Studies/Results: No results found. Medications:  sodium chloride  10 mL/hr at 06/24/23 2003   cefTRIAXone  (ROCEPHIN )  IV 2 g (06/25/23 0308)   metronidazole  500  mg (06/25/23 0158)   vancomycin  750 mg (06/23/23 1717)    acidophilus  2 capsule Oral TID   amiodarone   200 mg Oral Daily   vitamin C   1,000 mg Oral Daily   aspirin  EC  81 mg Oral Q breakfast   atorvastatin   80 mg Oral QHS   calcium  acetate  1,334 mg Oral TID WC   carvedilol   25 mg Oral BID   darbepoetin (ARANESP ) injection - NON-DIALYSIS  100 mcg Subcutaneous Q Thu-1800   feeding supplement (NEPRO CARB STEADY)  237 mL Oral TID BM   heparin   5,000 Units Subcutaneous Q8H   levETIRAcetam   500 mg Oral Once per day on Sunday Tuesday Thursday Saturday   levETIRAcetam   750 mg Oral Once per day on Monday Wednesday Friday   liver oil-zinc  oxide   Topical BID   multivitamin  1 tablet Oral QHS   nutrition supplement (JUVEN)  1 packet Oral BID BM   sodium chloride  flush  3 mL Intravenous Q12H   vancomycin   125 mg Oral QID   zinc  sulfate  (50mg  elemental zinc )  220 mg Oral Daily    Dialysis Orders: [facility] TTS  3 hr  edw 77kg,  LUE AVF   Heparin  1000 bolus + 400u/hr Mircera 100 mcg  q 2wks due  4/28 ( was no show)  No vitamin D     Assessment/Plan:  Infected wound L foot: hx of R AKA for similar issues. Ortho planning for surgery tomorrow Syncope/ hypoglycemia: admitting issues, per pmd Failure to thrive: per pmd ESRD: on HD TTS.  Off schedule this week, last HD Wednesday and did not have HD yesterday due to surgery. He is refusing dialysis today. Does not appear volume overloaded and K+ 4.4, he is aware of risks of missing HD but reports he is too uncomfortable.  HTN: Bp soft post op. Also having diarrhea. Receiving saline bolus.  Anemia of esrd: Hgb 7.0. Continue weekly aranesp , transfuse PRN Secondary hyperparathyroidism: CCa in range, phosphorus elevated. Continue phosphorus binder with meals.  Nutrition: requesting reg diet, knows to avoid OJ. Have changed to reg diet w/ 1200 cc FR.  Seizure d/o - on keppra  DM2 - per pmd    Ramona Burner, PA-C 06/25/2023, 10:11 AM  [facility] Pager: [phone]

## 2023-06-25 NOTE — Progress Notes (Signed)
 Progress Note   Patient: [name]. VWU:[id number] DOB: 1970/12/14 DOA: 06/20/2023     4 DOS: the patient was seen and examined on 06/25/2023   Brief hospital course: 53 y.o. 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 Incidentally noted to have infected ischemic wound of the left foot.   Assessment and Plan: Syncopal episode  w Hypoglycemic episode  -Reportedly slumped over and unconscious in PCP office. CBG 60 at the time;  - He is not on DM medications, does have poor oral intake.  - decreased intake + lightheadedness, despite ESRD may be slightly volume down.  - Periods of hypoglycemia noted. Cont hypoglycemic protocol as needed   Infected ischemic ulcer of the left heel: -History of gangrenous wounds on the right and has ultimately had a right AKA.  - On the left she has developed an ischemic wound over the heel which has been dry.     -Orthopedic Surgery consulted, now s/p L AKA 5/2 -Pt had been on vancomycin  pharmacy to dose, ceftriaxone  2 g IV every 24 hour, Flagyl  500 mg IV every 12 hours.  -Wound care consulted   Failure to thrive Progressive weakness now wheelchair-bound and using a Hoyer; despite recent SNF stay. diminished oral intake only taking 1 meal per day. -Nutrition consulted -PT/OT consulted   Chronic myocardial injury No history of chest pain.   -EKG does have ST depression and T wave inversion laterally, T wave inversion inferiorly.   - High-sensitivity troponin low level elevated and flat 28 -> 30  which is consistent with his chronic troponin.  Likely in the setting of underlying CAD, ESRD -Continued management for his chronic CAD per below   Sacral wound WOC consulted per above   Diarrhea with Cdiff and norovirus present on admission - GI pathogen pos for norovirus.  -Cdif antigen pos, toxin neg. C diff PCR pos -Continued on PO vanc -continues to have diarrhea, over 1L output over past 24h. Rectal tube in place. Defer to Nephrology if pt would benefit from basal IVF. Discussed with Nephrology   Chronic medical problems:   ESRD,Tu/Th/Sat HD:  -Nephroogy consulted for HD   CAD history STEMI / Hx CVA: Continue home aspirin , atorvastatin  80 mg,  Coreg  12.5 mg twice daily   HFpEF: Without acute exacerbation.  Not on diuretics   Paroxysmal A-fib off anticoagulation: Currently in sinus rhythm, not on anticoagulation.  Continue home amiodarone , coreg  per above.    Hx IPH: Noted, off AC.    Seizure disorder: Continue home Keppra  500 mg nondialysis days, 750 mg on HD days.   DM type II diet controlled: Recent hypoglycemia, not on diabetic medications.  Every 6hr CBG checks, hypoglycemia protocol  -Periods of low glucose, cont on dextrose  as needed   Hypertension: Currently uncontrolled, continue home antihypertensives per above Chronic anemia: Hx anemia CKD, defer to nephro re: Epo   Enucleation OS, hx DM retinopathy, glaucoma: Noted, ophtho f/u OP.      Subjective: Continues having diarrhea. Afraid to eat as a result  Physical Exam: Vitals:   06/25/23 0900 06/25/23 1206  06/25/23 1340 06/25/23 1511  BP: (!) 83/28 (!) 119/39 (!) 111/49 (!) 135/46  Pulse: 82     Resp: 18 18  16   Temp: 99.3 F (37.4 C) 99.6 F (37.6 C)  99.3 F (37.4 C)  TempSrc: Oral Oral  Axillary  SpO2: 98% 99%    Weight:      Height:       General exam: Awake, laying in bed, in nad Respiratory system: Normal respiratory effort, no wheezing Cardiovascular system: regular rate, s1,  s2 Gastrointestinal system: Soft, nondistended Central nervous system: CN2-12 grossly intact, strength intact Extremities: s/p BLE amputation Skin: Normal skin turgor, no notable skin lesions seen Psychiatry: Mood normal // no visual hallucinations   Data Reviewed:  Results are pending, will review when available.  Family Communication: Pt in room, family not at bedside  Disposition: Status is: Inpatient Remains inpatient appropriate because: severity of illness  Planned Discharge Destination: Skilled nursing facility    Author: Cherylle Corwin, MD 06/25/2023 5:12 PM  For on call review [email or web address].

## 2023-06-25 NOTE — Progress Notes (Addendum)
 Patient ID: [name]., male   DOB: 03-Dec-1970, 53 y.o.   MRN: [id number] Patient is postoperative day 1 above-knee amputation.  There is no drainage in the wound VAC canister there is a good suction fit.  Patient request order for phantom pain.  Order placed for Lyrica 50 mg daily.  Patient has been cared for at home and ideally he could return to home with full assistance at home.

## 2023-06-25 NOTE — Progress Notes (Signed)
 Patient changed his mind/refused to have his HD on first shift.

## 2023-06-25 NOTE — Progress Notes (Signed)
 Patient refused labs

## 2023-06-25 NOTE — Evaluation (Signed)
 Physical Therapy Re-Evaluation Patient Details Name: [name]. MRN: [id number] DOB: 01-Dec-1970 Today's Date: 06/25/2023  History of Present Illness  Pt is a 53 yr old male presenting 4/30 from PCP with hypoglycemia and general weakness. Pt also with limited PO intake prior to admission. 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 Additionally, pt noted to be + for Cdiff.  PMHx: ESRD on HD TTS, R AKA (Dr. Julio Ohm 2/25), CVA, atrial fibrillation not on anticoagulation, DM2, HTN, and seizure disorder.   Clinical Impression  Pt seen for re-evaluation after AKA on 5/2. Pt generally very pain limited, refusing most attempts to assess mobility, ROM, and strength. He did report significant phantom pain and itching, therefore was educated on techniques for desensitization and phantom pain management including visual and tactile sensation. Pt also with limited tolerance for HOB elevation due to back pain, refusing attempts to roll or be assisted to sitting position. Given poor tolerance for mobility, recommend post-acute rehab where pt could have assistance available for mobility and improve ability to manage bed mobility and repositioning on his own as he is home alone while his wife works. If the pt is adamant about returning home, recommend DME below as well as aide for improved safety and skincare to prevent further deconditioning and skin breakdown from immobility.       If plan is discharge home, recommend the following: Two people to help with walking and/or transfers;Help with stairs or ramp for entrance;Assist for transportation;Assistance with cooking/housework;Supervision due to cognitive status   Can travel by private vehicle   No    Equipment Recommendations Other (comment) (sliding board, drop arm BSC and recline back W/C. Pt needs rails on hospital bed)  Recommendations for Other Services       Functional Status Assessment Patient has had a recent decline in their  functional status and/or demonstrates limited ability to make significant improvements in function in a reasonable and predictable amount of time     Precautions / Restrictions Precautions Precautions: Fall Recall of Precautions/Restrictions: Intact Precaution/Restrictions Comments: bilateral AKA, L LE wound vac Restrictions Weight Bearing Restrictions Per Provider Order: Yes LLE Weight Bearing Per Provider Order: Non weight bearing      Mobility  Bed Mobility Overal bed mobility: Needs Assistance             General bed mobility comments: today required bed support to sit up, pt generally resistant to any change in position. refused to complete rolling or sit HOB above 20 deg. anticipate max-totalA to complete mobility    Transfers                   General transfer comment: pt declined bed mobility          Pertinent Vitals/Pain Pain Assessment Pain Assessment: Faces Faces Pain Scale: Hurts even more Pain Location: bilateral legs, reports phantom itching LLE. pain in LLE limiting any movement Pain Descriptors / Indicators: Aching, Burning, Constant Pain Intervention(s): Limited activity within patient's tolerance, Monitored during session, Patient requesting pain meds-RN notified, Repositioned    Home Living Family/patient expects to be discharged to:: Private residence Living Arrangements: Spouse/significant other Available Help at Discharge: Family;Available PRN/intermittently Type of Home: House Home Access: Ramped entrance       Home Layout: One level Home Equipment: Rollator (4 wheels);Rolling Walker (2 wheels);BSC/3in1;Hospital bed;Other (comment);Wheelchair - manual;Wheelchair - power (air mattress, [employer]) Additional Comments: Pt lives with wife who works during the day.    Prior Function  Prior Level of Function : Needs assist           ADLs (physical): Bathing;Dressing;Toileting;IADLs Mobility Comments: Pt bed bound, does not get OOB,  wife assists at bed level for all needs, has hoyer and manual w/c. Has Power w/c but is too small and they can't return it ADLs Comments: Pt bed bound, wife assists with all ADLs, Pt is afraid to eat during the day because he doesn't want to soil himself in bed, eat 1X/day. Pt has difficulty with FM skills, cannot hold spoons/forks, able to hand feed     Extremity/Trunk Assessment   Upper Extremity Assessment Upper Extremity Assessment: Defer to OT evaluation    Lower Extremity Assessment Lower Extremity Assessment: RLE deficits/detail;LLE deficits/detail RLE Deficits / Details: chronic R AKA wrapped, pt able to demo good ROM against gravity, reports sensation intact, did not allow further MMT RLE: Unable to fully assess due to pain RLE Sensation: WNL LLE Deficits / Details: new AKA, wound vac in place. limited assessment as pt refusing mobility due to pain. reports sensation intact, does have phantom itching. able to demo some hip flexion LLE: Unable to fully assess due to pain LLE Sensation: WNL    Cervical / Trunk Assessment Cervical / Trunk Assessment: Kyphotic  Communication   Communication Communication: No apparent difficulties    Cognition Arousal: Alert Behavior During Therapy: Flat affect   PT - Cognitive impairments: No family/caregiver present to determine baseline                       PT - Cognition Comments: pt resistant to any mobility despite possible complications from continued immobility. Pt accepting of education regarding phantom limb pain and desensitization Following commands: Impaired (and resistant) Following commands impaired: Only follows one step commands consistently, Follows one step commands with increased time     Cueing Cueing Techniques: Verbal cues, Tactile cues     General Comments General comments (skin integrity, edema, etc.): wound vac intact, pt reports not eating or drinking due to fear of needing to use bathroom.     Exercises Amputee Exercises Hip ABduction/ADduction: AROM, Right, 5 reps (pt refused to move Left) Hip Flexion/Marching: AROM, Right, 5 reps (pt refused to move Left)   Assessment/Plan    PT Assessment Patient needs continued PT services  PT Problem List Decreased strength;Decreased activity tolerance;Decreased balance;Decreased mobility;Decreased knowledge of use of DME;Decreased cognition;Decreased safety awareness;Decreased knowledge of precautions;Pain;Decreased skin integrity       PT Treatment Interventions DME instruction;Functional mobility training;Therapeutic activities;Therapeutic exercise;Balance training;Patient/family education;Neuromuscular re-education;Cognitive remediation;Wheelchair mobility training;Modalities    PT Goals (Current goals can be found in the Care Plan section)  Acute Rehab PT Goals Patient Stated Goal: Improve strength, independence and hopefully walk with prosthetic PT Goal Formulation: With patient Time For Goal Achievement: 07/07/23 Potential to Achieve Goals: Fair    Frequency Min 1X/week     Co-evaluation PT/OT/SLP Co-Evaluation/Treatment: Yes Reason for Co-Treatment: For patient/therapist safety;To address functional/ADL transfers PT goals addressed during session: Mobility/safety with mobility;Balance;Strengthening/ROM         AM-PAC PT "6 Clicks" Mobility  Outcome Measure Help needed turning from your back to your side while in a flat bed without using bedrails?: Total Help needed moving from lying on your back to sitting on the side of a flat bed without using bedrails?: Total Help needed moving to and from a bed to a chair (including a wheelchair)?: Total Help needed standing up from a chair using your arms (  e.g., wheelchair or bedside chair)?: Total Help needed to walk in hospital room?: Total Help needed climbing 3-5 steps with a railing? : Total 6 Click Score: 6    End of Session   Activity Tolerance: Patient limited by  pain Patient left: in bed;with call bell/phone within reach;with bed alarm set Nurse Communication: Mobility status;Patient requests pain meds PT Visit Diagnosis: Other abnormalities of gait and mobility (R26.89);Pain;Muscle weakness (generalized) (M62.81) Pain - Right/Left: Left Pain - part of body: Leg    Time: 0981-1914 PT Time Calculation (min) (ACUTE ONLY): 22 min   Charges:   PT Evaluation $PT Eval Moderate Complexity: 1 Mod   PT General Charges $$ ACUTE PT VISIT: 1 Visit         Barnabas Booth, PT, DPT   Acute Rehabilitation Department Office [phone] Secure Chat Communication Preferred  Lona Rist 06/25/2023, 12:06 PM

## 2023-06-25 NOTE — Progress Notes (Signed)
 Patient refused his blood sugar checked. Wife at bedside. Needs attended.

## 2023-06-25 NOTE — TOC Initial Note (Signed)
 Transition of Care (TOC) - Initial/Assessment Note    Patient Details  Name: [name]. MRN: [id number] Date of Birth: 12-10-1970  Transition of Care [facility]) CM/SW Contact:    Jannice Mends, LCSW Phone Number: 06/25/2023, 9:23 AM  Clinical Narrative:                 CSW received consult for possible SNF placement at time of discharge. CSW spoke with patient. Patient reported that patient's spouse is currently unable to care for patient at their home given patient's current physical needs and fall risk. Patient expressed understanding of PT recommendation and is agreeable to SNF placement at time of discharge. Patient reports being agreeable to return to [facility]. CSW discussed insurance authorization process and will provide Medicare SNF ratings list.   CSW will send out referrals for review and provide bed offers as available. Patient aware that he is in out of pocket co-pay status for SNF since he just recently discharge from SNF. Patient's family typically provides transport to Dialysis. CSW will inquire with Hoy Mackintosh if they can accept patient back. Will require insurance prior approval.    Skilled Nursing Rehab Facilities-   [email or web address]   Ratings out of 5 stars (5 the highest)   Name Address  Phone # Quality Care Staffing Health Inspection Overall  [facility] [address] [phone] 3 3 4 4   [facility] [address] [place] [phone] 5 1 4 4   [facility] [address], [place] [phone] 2 2 2 2   [facility] [address], [place] [phone] 5 2 4 5   [facility]  [address], [place] [phone] 4 3 5 5   [facility] [address], [facility] [phone] 4 2 2 2   [facility] [address], [place] [phone] 5 1 2 2   [facility] [address] [place] [phone] 2 1 3 2   [address] ([facility]) [address],  [place] [phone] 2 3 3 3   [facility] [address] [place], [place] [phone] 3 3 2 2   [facility] ([place]) [address], [place] [phone] 3 1 1 1   [facility] [address] [place] [phone] 2 3 4 4   [facility] [address], [place] [phone] 4 4 3 3   [facility] ([facility]) [address], [place] [phone] 1 2 4 3           [facility] [address] [place] [phone] 2 1 4 3   [facility] [address], [place] [phone] 4 2 1 1   [facility]  [address], [place], [place] [address] [phone] 2 2 2 2   [facility] [address] [phone] 3 2 4 4   [facility] [address], [place] [phone] 2 1 2 1   [facility] (No UHC) [address], [place] [phone] 5 4 5 5   [facility] [address], [place] [facility] [phone] 3 4 2 2   [facility] [address], [place] [phone] 2 1 1 1   [facility] [address] [place] [phone] 4 2 5 5   [facility] [address] [place] [phone] 4 1 1 1   [facility] [address] [place], [place] [phone] 2 2 3 3           [facility]  [phone] 3 1 1 1   [facility] [address] [place]  [phone] 3 3 3 3   [facility] (No Humana) [address] [place], [place] [phone] 2 2 4 4    [facility]) [address], [place] [phone] 2 1 1 1   [facility] [address] [place] [address], [place] [phone] 4 3 5 5   [facility] [place] [address], [place] [phone] 1 1 1 1           [facility] [address] [place], [place] [phone] 5 4 5 5   [facility])  [address] [place] [phone] 1 1 2 1   [facility]) [address], [place] [phone]  2 4 4   [facility] [address], [place] [phone] 3 5 5 5   [address] [place], [place] [phone] 4 2 2 2   [facility]) [address] [place] [phone] 2 1 3 2      Expected Discharge Plan: Skilled Nursing Facility Barriers to Discharge: Continued Medical Work up, English as a second language teacher, SNF Pending bed offer   Patient Goals and CMS Choice Patient states their goals for this hospitalization and ongoing recovery are:: Rehab CMS Medicare.gov Compare Post Acute Care list provided to:: Patient Choice offered to / list presented to : Patient [place] ownership interest in [facility].provided to:: Patient    Expected Discharge Plan and Services In-house Referral: Clinical Social Work   Post Acute Care Choice: Skilled Nursing Facility Living arrangements for the past 2 months: Single Family Home                                      Prior Living Arrangements/Services Living arrangements for the past 2 months: Single Family Home Lives with:: Spouse Patient language and need for interpreter reviewed:: Yes Do you feel safe going back to the place where you live?: Yes      Need for Family Participation in Patient Care: Yes (Comment) Care giver support system in place?: Yes (comment) Current home services: DME, Home OT, Home PT Criminal Activity/Legal Involvement Pertinent to Current Situation/Hospitalization: No - Comment as needed  Activities of Daily Living   ADL Screening (condition at time of admission) Independently performs ADLs?: No Does the patient have a NEW difficulty with bathing/dressing/toileting/self-feeding that is expected to last >3 days?: No Does the patient have a NEW difficulty with getting in/out of bed, walking, or climbing stairs that is expected to last >3 days?: No Does the patient have a NEW difficulty with communication that is expected to last >3 days?: No Is the patient deaf or have difficulty hearing?: No Does the patient have difficulty seeing, even when  wearing glasses/contacts?: Yes Does the patient have difficulty concentrating, remembering, or making decisions?: No  Permission Sought/Granted                  Emotional Assessment Appearance:: Appears stated age Attitude/Demeanor/Rapport: Engaged Affect (typically observed): Accepting, Appropriate Orientation: : Oriented to Self, Oriented to Place, Oriented to  Time, Oriented to Situation Alcohol  / Substance Use: Not Applicable Psych Involvement: No (comment)  Admission diagnosis:  Syncope [R55] Decreased appetite [R63.0] Failure to thrive in adult [R62.7] Generalized weakness [R53.1] Depression, unspecified depression type [F32.A] Patient Active Problem List   Diagnosis Date Noted   Gangrene of left foot (HCC) 06/24/2023   Syncope 06/21/2023   Hypoglycemia 06/21/2023   Ischemic ulcer of left heel (HCC) 06/21/2023   Failure to thrive in adult 06/21/2023  Generalized weakness 05/22/2023   Hx of AKA (above knee amputation), right (HCC) 05/11/2023   Pleural effusion 05/05/2023   Seizures (HCC) 05/05/2023   Right BKA infection (HCC) 04/04/2023   Pressure injury of skin 04/04/2023   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 difficile colitis 05/05/2021   Steal syndrome as complication of dialysis access (HCC) 02/06/2021    Coronary artery disease 02/05/2021   Dehiscence of amputation stump of right lower extremity (HCC) 01/29/2021   ESRD on hemodialysis (HCC)    Anemia due to chronic kidney disease, on chronic dialysis (HCC)    Paroxysmal atrial fibrillation (HCC) - not on systemic anticoagulation due to ICH while on Eliquis  in 11-2021. 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) 08/19/2020   Anxiety state 08/19/2020   Callosity 08/19/2020   ED (erectile dysfunction) of organic origin 08/19/2020   Long term (current) use of insulin  (HCC) 08/19/2020   Myalgia 08/19/2020   CAD/ H/o prior STEMI 02/2020 s/p DES to LAD 08/19/2020   Blindness of left eye 08/15/2020   Combined forms of age-related cataract of right eye 04/17/2020   Moderate protein-calorie malnutrition (HCC) 05/14/2019   Disorder of phosphorus metabolism, unspecified 05/07/2019   Iron deficiency anemia, unspecified 05/07/2019   Secondary hyperparathyroidism of renal origin (HCC) 11/28/2018   Chronic Grade 2 diastolic heart failure/EF 55 to 60% 12/13/2017   Diabetes mellitus, type II (HCC) 11/23/2017   Diabetic retinopathy (HCC) 07/05/2017   Uncontrolled type 2 diabetes mellitus with hyperglycemia, with long-term current use of insulin  (HCC) 04/20/2016   Mixed hyperlipidemia    Essential hypertension, benign    PCP:  Ayesha Lente, FNP Pharmacy:   [facility] [place], [place] - [address] [place] [address] Phone: [phone] Fax: [phone]     Social Drivers of Health (SDOH) Social History: SDOH Screenings   Food Insecurity: No Food Insecurity (06/21/2023)  Housing: High Risk (05/22/2023)  Transportation Needs: No Transportation Needs (05/22/2023)  Utilities: Patient Declined (06/21/2023)  Financial Resource Strain: High Risk (07/05/2017)  Physical Activity: Inactive (07/05/2017)  Social Connections: Socially Integrated (05/05/2023)  Stress: Stress Concern Present (07/05/2017)   Tobacco Use: Low Risk  (06/24/2023)  Health Literacy: Low Risk  (06/01/2020)   [facility]   SDOH Interventions:     Readmission Risk Interventions    06/25/2023    9:22 AM 06/21/2023   10:51 AM 05/17/2023    3:44 PM  Readmission Risk Prevention Plan  Transportation Screening Complete Complete Complete  Medication Review (RN Care Manager) Complete Complete Referral to Pharmacy  PCP or Specialist appointment within 3-5 days of discharge Complete  Complete  HRI or Home Care Consult Complete Complete Complete  SW Recovery Care/Counseling Consult Complete Complete Complete  Palliative Care Screening Not Applicable Not Applicable Not Applicable  Skilled  Nursing Facility Complete Not Applicable Not Applicable

## 2023-06-25 NOTE — [place] FL2 (Signed)
 [place]  MEDICAID FL2 LEVEL OF CARE FORM     IDENTIFICATION  Patient Name: [name]. Birthdate: 1970-07-24 Sex: male Admission Date (Current Location): 06/20/2023  [place] and [place] Number:  [employer] and Address:  [facility] [place] [place], [address], [place], [place] [address]      Provider Number: [id number]  Attending Physician Name and Address:  Oral Billings, MD  Relative Name and Phone Number:  Jiro Arvizo; Spouse; [phone]    Current Level of Care: Hospital Recommended Level of Care: Skilled Nursing Facility Prior Approval Number:    Date Approved/Denied:   PASRR Number: [id number]  Discharge Plan: SNF    Current Diagnoses: Patient Active Problem List   Diagnosis Date Noted   Gangrene of left foot (HCC) 06/24/2023   Syncope 06/21/2023   Hypoglycemia 06/21/2023   Ischemic ulcer of left heel (HCC) 06/21/2023   Failure to thrive in adult 06/21/2023   Generalized weakness 05/22/2023   Hx of AKA (above knee amputation), right (HCC) 05/11/2023   Pleural effusion 05/05/2023   Seizures (HCC) 05/05/2023   Right BKA infection (HCC) 04/04/2023   Pressure injury of skin 04/04/2023   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 difficile colitis 05/05/2021   Steal  syndrome as complication of dialysis access (HCC) 02/06/2021   Coronary artery disease 02/05/2021   Dehiscence of amputation stump of right lower extremity (HCC) 01/29/2021   ESRD on hemodialysis (HCC)    Anemia due to chronic kidney disease, on chronic dialysis (HCC)    Paroxysmal atrial fibrillation (HCC) - not on systemic anticoagulation due to ICH while on Eliquis  in 11-2021. 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) 08/19/2020   Anxiety state 08/19/2020   Callosity 08/19/2020   ED (erectile dysfunction) of organic origin 08/19/2020   Long term (current) use of insulin  (HCC) 08/19/2020   Myalgia 08/19/2020   CAD/ H/o prior STEMI 02/2020 s/p DES to LAD 08/19/2020   Blindness of left eye 08/15/2020   Combined forms of age-related cataract of right eye 04/17/2020   Moderate protein-calorie malnutrition (HCC) 05/14/2019   Disorder of phosphorus metabolism, unspecified 05/07/2019   Iron deficiency anemia, unspecified 05/07/2019   Secondary hyperparathyroidism of renal origin (HCC) 11/28/2018   Chronic Grade 2 diastolic heart failure/EF 55 to 60% 12/13/2017   Diabetes mellitus, type II (HCC) 11/23/2017   Diabetic retinopathy (HCC) 07/05/2017   Uncontrolled type 2 diabetes mellitus with hyperglycemia, with long-term current use of insulin  (HCC) 04/20/2016   Mixed hyperlipidemia    Essential hypertension, benign     Orientation RESPIRATION BLADDER Height & Weight     Self, Situation, Time, Place  Normal Continent Weight: 175 lb (79.4 kg) Height:  6\' 3"  (190.5 cm)  BEHAVIORAL SYMPTOMS/MOOD NEUROLOGICAL BOWEL NUTRITION STATUS      Incontinent Diet (See dc summary)  AMBULATORY STATUS COMMUNICATION OF NEEDS Skin   Extensive Assist Verbally PU Stage and Appropriate Care, Wound Vac (Stage II on sacrum; Regular wound vac for one week on leg)                       Personal Care Assistance Level of Assistance  Bathing, Feeding, Dressing Bathing Assistance: Maximum  assistance Feeding assistance: Independent Dressing Assistance: Limited assistance     Functional Limitations Info  Sight Sight Info: Impaired        SPECIAL CARE FACTORS FREQUENCY  PT (By licensed PT), OT (By licensed OT)     PT Frequency: 5x/week OT Frequency: 5x/week            Contractures Contractures Info: Not present    Additional Factors Info  Code Status, Allergies, Isolation Precautions Code Status Info: DNR Allergies Info: Promethazine      Isolation Precautions Info: Enteric precautions     Current Medications (06/25/2023):  This is the current hospital active medication list Current Facility-Administered Medications  Medication Dose Route Frequency Provider Last Rate Last Admin   0.9 %  sodium chloride  infusion   Intravenous Continuous Duda, Marcus V, MD 10 mL/hr at 06/24/23 2003 Restarted at 06/24/23 2003   acetaminophen  (TYLENOL ) tablet 1,000 mg  1,000 mg Oral Q6H PRN Timothy Ford, MD   1,000 mg at 06/25/23 0845   acidophilus (RISAQUAD) capsule 2 capsule  2 capsule Oral TID Timothy Ford, MD   2 capsule at 06/25/23 1610   amiodarone  (PACERONE ) tablet 200 mg  200 mg Oral Daily Duda, Marcus V, MD   200 mg at 06/25/23 0844   ascorbic acid  (VITAMIN C ) tablet 1,000 mg  1,000 mg Oral Daily Timothy Ford, MD   1,000 mg at 06/25/23 0844   aspirin  EC tablet 81 mg  81 mg Oral Q breakfast Timothy Ford, MD   81 mg at 06/25/23 9604   atorvastatin  (LIPITOR ) tablet 80 mg  80 mg Oral QHS Duda, Marcus V, MD   80 mg at 06/24/23 2129   calcium  acetate (PHOSLO ) capsule 1,334 mg  1,334 mg Oral TID WC Duda, Marcus V, MD   1,334 mg at 06/25/23 0844   carvedilol  (COREG ) tablet 25 mg  25 mg Oral BID Duda, Marcus V, MD   25 mg at 06/25/23 0843   cefTRIAXone  (ROCEPHIN ) 2 g in sodium chloride  0.9 % 100 mL IVPB  2 g Intravenous Q24H Timothy Ford, MD 200 mL/hr at 06/25/23 0308 2 g at 06/25/23 0308   Darbepoetin Alfa  (ARANESP ) injection 100 mcg  100 mcg Subcutaneous Q Thu-1800  Duda, Marcus V, MD   100 mcg at 06/23/23 1752   feeding supplement (NEPRO CARB STEADY) liquid 237 mL  237 mL Oral TID BM Duda, Marcus V, MD   237 mL at 06/25/23 0843   heparin  injection 5,000 Units  5,000 Units Subcutaneous Q8H Timothy Ford, MD   5,000 Units at 06/24/23 2130   hydrALAZINE  (APRESOLINE ) injection 10 mg  10 mg Intravenous Q4H PRN Timothy Ford, MD   10 mg at 06/22/23 1144   levETIRAcetam  (KEPPRA ) tablet 500 mg  500 mg Oral Once per day on Sunday Tuesday Thursday Saturday Timothy Ford, MD   500 mg at 06/25/23 5409   levETIRAcetam  (KEPPRA ) tablet 750 mg  750 mg Oral Once per day on Monday Wednesday Friday Timothy Ford, MD   750 mg at 06/24/23 8119   liver oil-zinc  oxide (  DESITIN) 40 % ointment   Topical BID Timothy Ford, MD   1 Application at 06/24/23 2130   melatonin tablet 6 mg  6 mg Oral QHS PRN Timothy Ford, MD   6 mg at 06/24/23 2128   metroNIDAZOLE  (FLAGYL ) IVPB 500 mg  500 mg Intravenous Q12H Timothy Ford, MD 100 mL/hr at 06/25/23 0158 500 mg at 06/25/23 0158   multivitamin (RENA-VIT) tablet 1 tablet  1 tablet Oral QHS Timothy Ford, MD   1 tablet at 06/24/23 2128   nutrition supplement (JUVEN) (JUVEN) powder packet 1 packet  1 packet Oral BID BM Duda, Marcus V, MD   1 packet at 06/25/23 0845   ondansetron  (ZOFRAN ) injection 4 mg  4 mg Intravenous Q6H PRN Timothy Ford, MD       polyethylene glycol (MIRALAX  / GLYCOLAX ) packet 17 g  17 g Oral Daily PRN Timothy Ford, MD       sodium chloride  flush (NS) 0.9 % injection 3 mL  3 mL Intravenous Q12H Timothy Ford, MD   3 mL at 06/24/23 1610   vancomycin  (VANCOCIN ) capsule 125 mg  125 mg Oral QID Timothy Ford, MD   125 mg at 06/25/23 0845   vancomycin  (VANCOREADY) IVPB 750 mg/150 mL  750 mg Intravenous Q T,Th,Sat-1800 Duda, Marcus V, MD 150 mL/hr at 06/23/23 1717 750 mg at 06/23/23 1717   zinc  sulfate (50mg  elemental zinc ) capsule 220 mg  220 mg Oral Daily Duda, Marcus V, MD   220 mg at 06/25/23 9604     Discharge  Medications: Please see discharge summary for a list of discharge medications.  Relevant Imaging Results:  Relevant Lab Results:   Additional Information SSN [id number]. [facility] [place] TTS.  Aidan Caloca S Aideen Fenster, LCSW

## 2023-06-25 NOTE — Progress Notes (Signed)
 Notified Dr. Joan Mouton and Dr. Zana Hesselbach that Pt. BP: 83/28.

## 2023-06-26 DIAGNOSIS — R630 Anorexia: Secondary | ICD-10-CM | POA: Diagnosis not present

## 2023-06-26 DIAGNOSIS — F32A Depression, unspecified: Secondary | ICD-10-CM | POA: Diagnosis not present

## 2023-06-26 DIAGNOSIS — R627 Adult failure to thrive: Secondary | ICD-10-CM | POA: Diagnosis not present

## 2023-06-26 LAB — COMPREHENSIVE METABOLIC PANEL WITH GFR
ALT: 12 U/L (ref 0–44)
AST: 11 U/L — ABNORMAL LOW (ref 15–41)
Albumin: 2.3 g/dL — ABNORMAL LOW (ref 3.5–5.0)
Alkaline Phosphatase: 54 U/L (ref 38–126)
Anion gap: 15 (ref 5–15)
BUN: 83 mg/dL — ABNORMAL HIGH (ref 6–20)
CO2: 15 mmol/L — ABNORMAL LOW (ref 22–32)
Calcium: 9 mg/dL (ref 8.9–10.3)
Chloride: 114 mmol/L — ABNORMAL HIGH (ref 98–111)
Creatinine, Ser: 13.09 mg/dL — ABNORMAL HIGH (ref 0.61–1.24)
GFR, Estimated: 4 mL/min — ABNORMAL LOW (ref >60)
Glucose, Bld: 104 mg/dL — ABNORMAL HIGH (ref 70–99)
Potassium: 4.6 mmol/L (ref 3.5–5.1)
Sodium: 144 mmol/L (ref 135–145)
Total Bilirubin: 0.6 mg/dL (ref 0.0–1.2)
Total Protein: 5.6 g/dL — ABNORMAL LOW (ref 6.5–8.1)

## 2023-06-26 LAB — CBC
HCT: 23.5 % — ABNORMAL LOW (ref 39.0–52.0)
Hemoglobin: 7 g/dL — ABNORMAL LOW (ref 13.0–17.0)
MCH: 22.2 pg — ABNORMAL LOW (ref 26.0–34.0)
MCHC: 29.8 g/dL — ABNORMAL LOW (ref 30.0–36.0)
MCV: 74.6 fL — ABNORMAL LOW (ref 80.0–100.0)
Platelets: 141 10*3/uL — ABNORMAL LOW (ref 150–400)
RBC: 3.15 MIL/uL — ABNORMAL LOW (ref 4.22–5.81)
RDW: 24.4 % — ABNORMAL HIGH (ref 11.5–15.5)
WBC: 14.3 10*3/uL — ABNORMAL HIGH (ref 4.0–10.5)
nRBC: 0 % (ref 0.0–0.2)

## 2023-06-26 LAB — GLUCOSE, CAPILLARY
Glucose-Capillary: 69 mg/dL — ABNORMAL LOW (ref 70–99)
Glucose-Capillary: 102 mg/dL — ABNORMAL HIGH (ref 70–99)
Glucose-Capillary: 137 mg/dL — ABNORMAL HIGH (ref 70–99)

## 2023-06-26 MED ORDER — GLUCOSE 40 % PO GEL: 1.0000 | ORAL | Status: AC

## 2023-06-26 NOTE — Progress Notes (Signed)
 Hypoglycemic Event  CBG: 69  Treatment: 4 oz juice/soda  Symptoms: Hungry  Follow-up CBG: Time:0548 CBG Result:102  Possible Reasons for Event: Inadequate meal intake  Comments/MD notified:Per hypoglycemia protocol    [name] Joselita

## 2023-06-26 NOTE — Progress Notes (Signed)
 Progress Note   Patient: [name] Jerrell Jr. [id number]:[id number] DOB: 10-18-1970 DOA: 06/20/2023     5 DOS: the patient was seen and examined on 06/26/2023   Brief hospital course: 53 y.o. 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 Incidentally noted to have infected ischemic wound of the left foot.   Assessment and Plan: Syncopal episode  w Hypoglycemic episode  -Reportedly slumped over and unconscious in PCP office. CBG 60 at the time;  - He is not on DM medications, does have poor oral intake.  - decreased intake + lightheadedness, despite ESRD may be slightly volume down.  - Periods of hypoglycemia noted likely secondary to decreased PO intake. Cont hypoglycemic protocol as needed   Infected ischemic ulcer of the left heel: -History of gangrenous wounds on the right and has ultimately had a right AKA.  - On the left she has developed an ischemic wound over the heel which has been dry.     -Orthopedic Surgery consulted, now s/p L AKA 5/2 -Pt had been on vancomycin  pharmacy to dose, ceftriaxone  2 g IV every 24 hour, Flagyl  500 mg IV every 12 hours.  -Wound care consulted   Failure to thrive Progressive weakness now wheelchair-bound and using a Hoyer; despite recent SNF stay. diminished oral intake only taking 1 meal per day. -Nutrition consulted -PT/OT consulted   Chronic myocardial injury No history of chest pain.   -EKG does have ST depression and T wave inversion laterally, T wave inversion inferiorly.   - High-sensitivity  troponin low level elevated and flat 28 -> 30 which is consistent with his chronic troponin.  Likely in the setting of underlying CAD, ESRD -Continued management for his chronic CAD per below   Sacral wound WOC consulted per above   Diarrhea with Cdiff and norovirus present on admission - GI pathogen pos for norovirus.  -Cdif antigen pos, toxin neg. C diff PCR pos -Continued on PO vanc -Stool is now becoming more formed. Will remove rectal tube   Chronic medical problems:   ESRD,Tu/Th/Sat HD:  -Nephroogy consulted for HD   CAD history STEMI / Hx CVA: Continue home aspirin , atorvastatin  80 mg,  Coreg  12.5 mg twice daily   HFpEF: Without acute exacerbation.  Not on diuretics   Paroxysmal A-fib off anticoagulation: Currently in sinus rhythm, not on anticoagulation.  Continue home amiodarone , coreg  per above.    Hx IPH: Noted, off AC.    Seizure disorder: Continue home Keppra  500 mg nondialysis days, 750 mg on HD days.   DM type II diet controlled: Recent hypoglycemia, not on diabetic medications.  Every 6hr CBG checks, hypoglycemia protocol  -Periods of low glucose secondary to decreased PO intake, cont on dextrose  as needed   Hypertension: Currently uncontrolled, continue home antihypertensives per above Chronic anemia: Hx anemia CKD, defer to nephro re: Epo   Enucleation OS, hx DM retinopathy, glaucoma: Noted, ophtho f/u OP.      Subjective: Stools are becoming more formed, no longer filling rectal tube bag  Physical Exam: Vitals:   06/25/23 2339 06/26/23 0500 06/26/23 0800 06/26/23 1338  BP: 123/65 (!) 96/36 117/63 124/67  Pulse: 66 63  67  Resp: 16 18    Temp: 99 F (37.2 C) 98.4 F (36.9 C)    TempSrc: Axillary Oral    SpO2: 95% 98%    Weight:      Height:       General exam: Awake, laying in bed, in nad Respiratory system: Normal respiratory effort, no wheezing Cardiovascular system: regular rate, s1, s2 Gastrointestinal system: Soft, nondistended, positive  BS Central nervous system: CN2-12 grossly intact, strength intact Extremities: Perfused, no clubbing, s/p BLE amputation Skin: Normal skin turgor, no notable skin lesions seen Psychiatry: Mood normal // no visual hallucinations   Data Reviewed:  Labs reviewed: Na 144, K 4.6, WBC 14.3, Hgb 7.0  Family Communication: Pt in room, family at bedside  Disposition: Status is: Inpatient Remains inpatient appropriate because: severity of illness  Planned Discharge Destination: Skilled nursing facility    Author: Cherylle Corwin, MD 06/26/2023 5:37 PM  For on call review [email or web address]

## 2023-06-26 NOTE — Progress Notes (Signed)
 [facility] Progress Note   Subjective:   Reports he is having leg pain and ongoing diarrhea. He is agreeable to HD tomorrow. Denies SOB, dizziness, nausea.   Objective Vitals:   06/25/23 1340 06/25/23 1511 06/25/23 2339 06/26/23 0500  BP: (!) 111/49 (!) 135/46 123/65 (!) 96/36  Pulse:   66 63  Resp:  16 16 18   Temp:  99.3 F (37.4 C) 99 F (37.2 C) 98.4 F (36.9 C)  TempSrc:  Axillary Axillary Oral  SpO2:   95% 98%  Weight:      Height:       Physical Exam General: Alert, oriented, NAD Heart: RRR, no murmurs, rubs or gallops Lungs: CTA bilaterally, respirations unlabored Abdomen: Soft, non-distended, +BS Extremities: L stump w/wound vac, no edema RLE Dialysis Access: LUE AVF + t/b  Additional Objective Labs: Basic Metabolic Panel: Recent Labs  Lab 06/21/23 0517 06/22/23 0821 06/23/23 0526 06/24/23 1305 06/25/23 0733  NA 140 143 140 137 146*  K 4.7 5.4* 4.5 4.8 4.4  CL 110 112* 103 110 110  CO2 17* 14* 22  --  18*  GLUCOSE 133* 59* 88 67* 97  BUN 78* 87* 53* 58* 69*  CREATININE 11.55* 12.51* 9.15* 11.40* 11.76*  CALCIUM  8.6* 8.5* 8.8*  --  9.5  PHOS 8.5*  --   --   --  9.4*   Liver Function Tests: Recent Labs  Lab 06/22/23 0821 06/23/23 0526 06/25/23 0733  AST 8* 11* 17  ALT 11 11 11   ALKPHOS 62 62 55  BILITOT 0.7 0.7 0.6  PROT 6.0* 6.0* 5.6*  ALBUMIN  2.3* 2.4* 2.4*   No results for input(s): "LIPASE", "AMYLASE" in the last 168 hours. CBC: Recent Labs  Lab 06/20/23 1823 06/21/23 0517 06/22/23 0821 06/23/23 0526 06/24/23 1305 06/25/23 0726 06/26/23 0721  WBC 10.2 8.4 7.4 7.9  --  14.4* 14.3*  NEUTROABS 7.1  --   --   --   --   --   --   HGB 7.5* 7.8* 7.6* 7.9* 8.2* 7.0* 7.0*  HCT 26.1* 27.3* 25.5* 25.8* 24.0* 22.9* 23.5*  MCV 73.7* 74.4* 72.6* 70.7*  --  71.8* 74.6*  PLT 102* 109* 97* 113*  --  127* 141*   Blood Culture    Component Value Date/Time   SDES BLOOD RIGHT ARM 06/25/2023 0911   SPECREQUEST  06/25/2023 0911     BOTTLES DRAWN AEROBIC AND ANAEROBIC Blood Culture adequate volume   CULT  06/25/2023 0911    NO GROWTH < 24 HOURS Performed at [facility], [address], [place], [place] [address]    REPTSTATUS PENDING 06/25/2023 4132    Cardiac Enzymes: No results for input(s): "CKTOTAL", "CKMB", "CKMBINDEX", "TROPONINI" in the last 168 hours. CBG: Recent Labs  Lab 06/24/23 1056 06/24/23 1418 06/25/23 2339 06/26/23 0518 06/26/23 0548  GLUCAP 72 84 87 69* 102*   Iron Studies: No results for input(s): "IRON", "TIBC", "TRANSFERRIN", "FERRITIN" in the last 72 hours. @lablastinr3 @ Studies/Results: DG CHEST PORT 1 VIEW Result Date: 06/25/2023 CLINICAL DATA:  Fever EXAM: PORTABLE CHEST 1 VIEW COMPARISON:  06/16/2023 FINDINGS: Stable right basilar parenchymal scarring. Persistent right basilar consolidation and small right pleural effusion, possibly representing a parapneumonic effusion. Left lung is clear. No pneumothorax. No pleural effusion on the left. Cardiac size is within normal limits. No acute bone abnormality. IMPRESSION: 1. Persistent right basilar consolidation and small right pleural effusion, possibly representing a parapneumonic effusion. Electronically Signed   By: Worthy Heads  M.D.   On: 06/25/2023 11:46   Medications:  sodium chloride  10 mL/hr at 06/24/23 2003    acidophilus  2 capsule Oral TID   amiodarone   200 mg Oral Daily   vitamin C   1,000 mg Oral Daily   aspirin  EC  81 mg Oral Q breakfast   atorvastatin   80 mg Oral QHS   calcium  acetate  1,334 mg Oral TID WC   carvedilol   25 mg Oral BID   darbepoetin (ARANESP ) injection - NON-DIALYSIS  100 mcg Subcutaneous Q Thu-1800   dextrose   1 Tube Oral STAT   feeding supplement (NEPRO CARB STEADY)  237 mL Oral TID BM   heparin   5,000 Units Subcutaneous Q8H   levETIRAcetam   500 mg Oral Once per day on Sunday Tuesday Thursday Saturday   levETIRAcetam   750 mg Oral Once per day on Monday Wednesday Friday   liver oil-zinc   oxide   Topical BID   multivitamin  1 tablet Oral QHS   nutrition supplement (JUVEN)  1 packet Oral BID BM   pregabalin  50 mg Oral Daily   sodium chloride  flush  3 mL Intravenous Q12H   vancomycin   125 mg Oral QID   zinc  sulfate (50mg  elemental zinc )  220 mg Oral Daily    Dialysis Orders: [facility]  3 hr  edw 77kg,  LUE AVF   Heparin  1000 bolus + 400u/hr Mircera 100 mcg  q 2wks due  4/28 ( was no show)  No vitamin D   Assessment/Plan:  Infected wound L foot: hx of R AKA for similar issues. S/p L AKA  Syncope/ hypoglycemia: admitting issues, per pmd Failure to thrive: per pmd ESRD: on HD TTS.  Off schedule this week, last HD Wednesday and did not have HD Friday due to surgery, refused Saturday due to diarrhea. Will plan for next HD Monday then resume regular schedule HTN: Bp soft post op. Also having diarrhea. Received 500ml [place] with some improvement.  Anemia of esrd: Hgb 7.0. Continue weekly aranesp , transfuse PRN Secondary hyperparathyroidism: CCa in range, phosphorus elevated. Continue phosphorus binder with meals.  Nutrition: not eating much due to GI upset Seizure d/o - on keppra  DM2 - per pmd   Ramona Burner, PA-C 06/26/2023, 8:20 AM  [facility] Pager: [phone]

## 2023-06-27 ENCOUNTER — Encounter (HOSPITAL_COMMUNITY): Payer: Self-pay | Admitting: Orthopedic Surgery

## 2023-06-27 DIAGNOSIS — R630 Anorexia: Secondary | ICD-10-CM | POA: Diagnosis not present

## 2023-06-27 DIAGNOSIS — F32A Depression, unspecified: Secondary | ICD-10-CM | POA: Diagnosis not present

## 2023-06-27 DIAGNOSIS — R627 Adult failure to thrive: Secondary | ICD-10-CM | POA: Diagnosis not present

## 2023-06-27 LAB — GLUCOSE, CAPILLARY
Glucose-Capillary: 88 mg/dL (ref 70–99)
Glucose-Capillary: 97 mg/dL (ref 70–99)

## 2023-06-27 LAB — SURGICAL PATHOLOGY

## 2023-06-27 NOTE — Plan of Care (Signed)
  Problem: Metabolic: Goal: Ability to maintain appropriate glucose levels will improve Outcome: Progressing   Problem: Nutritional: Goal: Maintenance of adequate nutrition will improve Outcome: Progressing   Problem: Skin Integrity: Goal: Risk for impaired skin integrity will decrease Outcome: Progressing   Problem: Clinical Measurements: Goal: Ability to maintain clinical measurements within normal limits will improve Outcome: Progressing Goal: Will remain free from infection Outcome: Progressing   Problem: Pain Managment: Goal: General experience of comfort will improve and/or be controlled Outcome: Progressing   Problem: Safety: Goal: Ability to remain free from injury will improve Outcome: Progressing   Problem: Skin Integrity: Goal: Risk for impaired skin integrity will decrease Outcome: Progressing

## 2023-06-27 NOTE — Progress Notes (Signed)
 Patient ID: [name] Porta., male   DOB: 10-May-1970, 53 y.o.   MRN: [id number] Patient is a 53 year old gentleman who is status post left above-knee amputation.  There is 25 cc of drainage in the wound VAC canister.  Patient states the dressing feels tight.  Plan for discharge to home with a dry dressing and remove the VAC at time of discharge.

## 2023-06-27 NOTE — Plan of Care (Signed)
 Problem: Health Behavior/Discharge Planning: Goal: Ability to manage health-related needs will improve Outcome: Progressing   Problem: Clinical Measurements: Goal: Ability to maintain clinical measurements within normal limits will improve Outcome: Progressing Goal: Will remain free from infection Outcome: Progressing Goal: Diagnostic test results will improve Outcome: Progressing Goal: Respiratory complications will improve Outcome: Progressing Goal: Cardiovascular complication will be avoided Outcome: Progressing   Problem: Activity: Goal: Risk for activity intolerance will decrease Outcome: Progressing   Problem: Nutrition: Goal: Adequate nutrition will be maintained Outcome: Progressing   Problem: Coping: Goal: Level of anxiety will decrease Outcome: Progressing   Problem: Elimination: Goal: Will not experience complications related to bowel motility Outcome: Progressing Goal: Will not experience complications related to urinary retention Outcome: Progressing   Problem: Pain Managment: Goal: General experience of comfort will improve and/or be controlled Outcome: Progressing   Problem: Safety: Goal: Ability to remain free from injury will improve Outcome: Progressing   Problem: Skin Integrity: Goal: Risk for impaired skin integrity will decrease Outcome: Progressing   Problem: Education: Goal: Knowledge of the prescribed therapeutic regimen will improve Outcome: Progressing Goal: Ability to verbalize activity precautions or restrictions will improve Outcome: Progressing Goal: Understanding of discharge needs will improve Outcome: Progressing   Problem: Activity: Goal: Ability to perform//tolerate increased activity and mobilize with assistive devices will improve Outcome: Progressing   Problem: Clinical Measurements: Goal: Postoperative complications will be avoided or minimized Outcome: Progressing   Problem: Self-Care: Goal: Ability to meet  self-care needs will improve Outcome: Progressing   Problem: Self-Concept: Goal: Ability to maintain and perform role responsibilities to the fullest extent possible will improve Outcome: Progressing   Problem: Pain Management: Goal: Pain level will decrease with appropriate interventions Outcome: Progressing   Problem: Skin Integrity: Goal: Demonstration of wound healing without infection will improve Outcome: Progressing   Problem: Health Behavior/Discharge Planning: Goal: Ability to manage health-related needs will improve Outcome: Progressing   Problem: Clinical Measurements: Goal: Ability to maintain clinical measurements within normal limits will improve Outcome: Progressing Goal: Will remain free from infection Outcome: Progressing Goal: Diagnostic test results will improve Outcome: Progressing Goal: Respiratory complications will improve Outcome: Progressing Goal: Cardiovascular complication will be avoided Outcome: Progressing   Problem: Activity: Goal: Risk for activity intolerance will decrease Outcome: Progressing   Problem: Nutrition: Goal: Adequate nutrition will be maintained Outcome: Progressing   Problem: Coping: Goal: Level of anxiety will decrease Outcome: Progressing   Problem: Elimination: Goal: Will not experience complications related to bowel motility Outcome: Progressing Goal: Will not experience complications related to urinary retention Outcome: Progressing   Problem: Pain Managment: Goal: General experience of comfort will improve and/or be controlled Outcome: Progressing   Problem: Safety: Goal: Ability to remain free from injury will improve Outcome: Progressing   Problem: Skin Integrity: Goal: Risk for impaired skin integrity will decrease Outcome: Progressing   Problem: Education: Goal: Knowledge of the prescribed therapeutic regimen will improve Outcome: Progressing Goal: Ability to verbalize activity precautions or  restrictions will improve Outcome: Progressing Goal: Understanding of discharge needs will improve Outcome: Progressing   Problem: Activity: Goal: Ability to perform//tolerate increased activity and mobilize with assistive devices will improve Outcome: Progressing   Problem: Clinical Measurements: Goal: Postoperative complications will be avoided or minimized Outcome: Progressing   Problem: Self-Care: Goal: Ability to meet self-care needs will improve Outcome: Progressing   Problem: Self-Concept: Goal: Ability to maintain and perform role responsibilities to the fullest extent possible will improve Outcome: Progressing   Problem: Pain Management: Goal: Pain level will  decrease with appropriate interventions Outcome: Progressing   Problem: Skin Integrity: Goal: Demonstration of wound healing without infection will improve Outcome: Progressing

## 2023-06-27 NOTE — Progress Notes (Signed)
 Progress Note   Patient: [name] Valeri Jr. ZOX:[id number] DOB: 1970-04-18 DOA: 06/20/2023     6 DOS: the patient was seen and examined on 06/27/2023   Brief hospital course: 53 y.o. 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 Incidentally noted to have infected ischemic wound of the left foot.   Assessment and Plan: Syncopal episode  w Hypoglycemic episode  -Reportedly slumped over and unconscious in PCP office. CBG 60 at the time;  - He is not on DM medications, does have poor oral intake.  - decreased intake + lightheadedness, despite ESRD may be slightly volume down.  - Periods of hypoglycemia noted likely secondary to decreased PO intake. Cont hypoglycemic protocol as needed   Infected ischemic ulcer of the left heel: -History of gangrenous wounds on the right and has ultimately had a right AKA.  - On the left she has developed an ischemic wound over the heel which has been dry.     -Orthopedic Surgery consulted, now s/p L AKA 5/2 -Pt had been on vancomycin  pharmacy to dose, ceftriaxone  2 g IV every 24 hour, Flagyl  500 mg IV every 12 hours.  -Wound care was earlier consulted   Failure to thrive Progressive weakness now wheelchair-bound and using a Hoyer; despite recent SNF stay. diminished oral intake only taking 1 meal per day. -Nutrition consulted -PT/OT consulted, recs for SNF   Chronic myocardial injury No history of chest pain.   -EKG does have ST depression and T wave inversion laterally, T wave inversion inferiorly.    - High-sensitivity troponin low level elevated and flat 28 -> 30 which is consistent with his chronic troponin.  Likely in the setting of underlying CAD, ESRD -Continued management for his chronic CAD per below   Sacral wound WOC consulted per above   Diarrhea with Cdiff and norovirus present on admission - GI pathogen pos for norovirus.  -Cdif antigen pos, toxin neg. C diff PCR pos -Continued on PO vanc -Stool is becoming more formed. Rectal tube removed 5/4   Chronic medical problems:   ESRD,Tu/Th/Sat HD:  -Nephroogy consulted for HD   CAD history STEMI / Hx CVA: Continue home aspirin , atorvastatin  80 mg,  Coreg  12.5 mg twice daily   HFpEF: Without acute exacerbation.  Not on diuretics   Paroxysmal A-fib off anticoagulation: Currently in sinus rhythm, not on anticoagulation.  Continue home amiodarone , coreg  per above.    Hx IPH: Noted, off AC.    Seizure disorder: Continue home Keppra  500 mg nondialysis days, 750 mg on HD days.   DM type II diet controlled: Recent hypoglycemia, not on diabetic medications.  Every 6hr CBG checks, hypoglycemia protocol  -Periods of low glucose secondary to decreased PO intake, cont on dextrose  as needed   Hypertension: Currently uncontrolled, continue home antihypertensives per above Chronic anemia: Hx anemia CKD, defer to nephro re: Epo   Enucleation OS, hx DM retinopathy, glaucoma: Noted, ophtho f/u OP.      Subjective: Seen on HD. Complains of generalized malaise  Physical Exam: Vitals:   06/27/23 1224 06/27/23 1230 06/27/23 1343 06/27/23 1557  BP: (!) 167/74 (!) 167/74 (!) 115/41 (!) 185/77  Pulse: (!) 51 73 75 75  Resp: 18 17  18   Temp:      TempSrc:      SpO2: 100% 100% 100% 100%  Weight:      Height:       General exam: Conversant, in no acute distress Respiratory system: normal chest rise, clear, no audible wheezing Cardiovascular system: regular rhythm, s1-s2 Gastrointestinal system: Nondistended, nontender, pos  BS Central nervous system: No seizures, no tremors Extremities: No cyanosis, no joint deformities Skin: No rashes, no pallor Psychiatry: Affect normal // no auditory hallucinations   Data Reviewed:  There are no new results to review at this time.  Family Communication: Pt in room, family at bedside  Disposition: Status is: Inpatient Remains inpatient appropriate because: severity of illness  Planned Discharge Destination: Skilled nursing facility    Author: Cherylle Corwin, MD 06/27/2023 3:58 PM  For on call review [email or web address].

## 2023-06-27 NOTE — Progress Notes (Signed)
 [facility] Progress Note   Subjective:   Diarrhea and noted +c diff on vanc.  No new complaints.  For HD today. Denies SOB, dizziness, nausea.   Objective Vitals:   06/26/23 1338 06/26/23 1822 06/26/23 1939 06/26/23 2137  BP: 124/67 117/60 120/67 120/67  Pulse: 67 60 73 73  Resp:  16    Temp:  99.1 F (37.3 C) 98.4 F (36.9 C)   TempSrc:  Oral Oral   SpO2:  100% 95%   Weight:      Height:       Physical Exam General: Alert, oriented, NAD Heart: RRR, no murmurs, rubs or gallops Lungs: CTA bilaterally, respirations unlabored Abdomen: Soft, non-distended, +BS Extremities: L stump w/wound vac, no edema RLE Dialysis Access: LUE AVF + t/b  Additional Objective Labs: Basic Metabolic Panel: Recent Labs  Lab 06/21/23 0517 06/22/23 0821 06/23/23 0526 06/24/23 1305 06/25/23 0733 06/26/23 0721  NA 140   < > 140 137 146* 144  K 4.7   < > 4.5 4.8 4.4 4.6  CL 110   < > 103 110 110 114*  CO2 17*   < > 22  --  18* 15*  GLUCOSE 133*   < > 88 67* 97 104*  BUN 78*   < > 53* 58* 69* 83*  CREATININE 11.55*   < > 9.15* 11.40* 11.76* 13.09*  CALCIUM  8.6*   < > 8.8*  --  9.5 9.0  PHOS 8.5*  --   --   --  9.4*  --    < > = values in this interval not displayed.   Liver Function Tests: Recent Labs  Lab 06/23/23 0526 06/25/23 0733 06/26/23 0721  AST 11* 17 11*  ALT 11 11 12   ALKPHOS 62 55 54  BILITOT 0.7 0.6 0.6  PROT 6.0* 5.6* 5.6*  ALBUMIN  2.4* 2.4* 2.3*   No results for input(s): "LIPASE", "AMYLASE" in the last 168 hours. CBC: Recent Labs  Lab 06/20/23 1823 06/21/23 0517 06/22/23 0821 06/23/23 0526 06/24/23 1305 06/25/23 0726 06/26/23 0721  WBC 10.2 8.4 7.4 7.9  --  14.4* 14.3*  NEUTROABS 7.1  --   --   --   --   --   --   HGB 7.5* 7.8* 7.6* 7.9* 8.2* 7.0* 7.0*  HCT 26.1* 27.3* 25.5* 25.8* 24.0* 22.9* 23.5*  MCV 73.7* 74.4* 72.6* 70.7*  --  71.8* 74.6*  PLT 102* 109* 97* 113*  --  127* 141*   Blood Culture    Component Value Date/Time   SDES  BLOOD RIGHT ARM 06/25/2023 0911   SPECREQUEST  06/25/2023 0911    BOTTLES DRAWN AEROBIC AND ANAEROBIC Blood Culture adequate volume   CULT  06/25/2023 0911    NO GROWTH 2 DAYS Performed at [facility], [address] [place], [place] [address]    REPTSTATUS PENDING 06/25/2023 6045    Cardiac Enzymes: No results for input(s): "CKTOTAL", "CKMB", "CKMBINDEX", "TROPONINI" in the last 168 hours. CBG: Recent Labs  Lab 06/24/23 1418 06/25/23 2339 06/26/23 0518 06/26/23 0548 06/26/23 2241  GLUCAP 84 87 69* 102* 137*   Iron Studies: No results for input(s): "IRON", "TIBC", "TRANSFERRIN", "FERRITIN" in the last 72 hours. @lablastinr3 @ Studies/Results: DG CHEST PORT 1 VIEW Result Date: 06/25/2023 CLINICAL DATA:  Fever EXAM: PORTABLE CHEST 1 VIEW COMPARISON:  06/16/2023 FINDINGS: Stable right basilar parenchymal scarring. Persistent right basilar consolidation and small right pleural effusion, possibly representing a parapneumonic effusion. Left lung is clear. No  pneumothorax. No pleural effusion on the left. Cardiac size is within normal limits. No acute bone abnormality. IMPRESSION: 1. Persistent right basilar consolidation and small right pleural effusion, possibly representing a parapneumonic effusion. Electronically Signed   By: Worthy Heads M.D.   On: 06/25/2023 11:46   Medications:  sodium chloride  10 mL/hr at 06/24/23 2003    acidophilus  2 capsule Oral TID   amiodarone   200 mg Oral Daily   vitamin C   1,000 mg Oral Daily   aspirin  EC  81 mg Oral Q breakfast   atorvastatin   80 mg Oral QHS   calcium  acetate  1,334 mg Oral TID WC   carvedilol   25 mg Oral BID   darbepoetin (ARANESP ) injection - NON-DIALYSIS  100 mcg Subcutaneous Q Thu-1800   feeding supplement (NEPRO CARB STEADY)  237 mL Oral TID BM   heparin   5,000 Units Subcutaneous Q8H   levETIRAcetam   500 mg Oral Once per day on Sunday Tuesday Thursday Saturday   levETIRAcetam   750 mg Oral Once per day on Monday  Wednesday Friday   liver oil-zinc  oxide   Topical BID   multivitamin  1 tablet Oral QHS   nutrition supplement (JUVEN)  1 packet Oral BID BM   pregabalin  50 mg Oral Daily   sodium chloride  flush  3 mL Intravenous Q12H   vancomycin   125 mg Oral QID   zinc  sulfate (50mg  elemental zinc )  220 mg Oral Daily    Dialysis Orders: [facility]  3 hr  edw 77kg,  LUE AVF   Heparin  1000 bolus + 400u/hr Mircera 100 mcg  q 2wks due  4/28 ( was no show)  No vitamin D   Assessment/Plan:  Infected wound L foot: hx of R AKA for similar issues. S/p L AKA  Failure to thrive: per pmd. ESRD: on HD TTS.  Off schedule this week, last HD Wednesday and did not have HD Friday due to surgery, refused Saturday due to diarrhea.HD today and will resume regular schedule at some point. HTN: Bp soft post op. Also having diarrhea. Received 500ml [place] with some improvement.  Anemia of esrd: Hgb 7.0. Continue weekly aranesp , transfuse PRN Secondary hyperparathyroidism: CCa in range, phosphorus elevated. Continue phosphorus binder with meals.  Nutrition: optimize as able Seizure d/o - on keppra  DM2 - per pmd   [name] Alba MD [facility] Pager [phone]

## 2023-06-27 NOTE — Progress Notes (Signed)
   06/27/23 1209  Vitals  Temp 97.7 F (36.5 C)  Pulse Rate 71  Resp 10  BP (!) 190/60  SpO2 99 %  O2 Device Room Air  Oxygen Therapy  Patient Activity (if Appropriate) In bed  Pulse Oximetry Type Continuous  Oximetry Probe Site Changed No  During Treatment Monitoring  Blood Flow Rate (mL/min) 0 mL/min  Arterial Pressure (mmHg) -461.59 mmHg  Venous Pressure (mmHg) 48.08 mmHg  TMP (mmHg) 2.83 mmHg  Ultrafiltration Rate (mL/min) 267 mL/min  Dialysate Flow Rate (mL/min) 299 ml/min  Dialysate Potassium Concentration 2  Dialysate Calcium  Concentration 2.5  Duration of HD Treatment -hour(s) 3 hour(s)  Cumulative Fluid Removed (mL) per Treatment  0.05  HD Safety Checks Performed Yes  Intra-Hemodialysis Comments Tx completed   Received patient in bed to unit.  Alert and oriented.  Informed consent signed and in chart.   TX duration: 3  Patient tolerated well.  Transported back to the room  Alert, without acute distress.  Hand-off given to patient's nurse.   Access used: LAVF Access issues: None  Total UF removed: 0 Medication(s) given: None    Deann Exon, LPN  [facility]

## 2023-06-28 DIAGNOSIS — F32A Depression, unspecified: Secondary | ICD-10-CM | POA: Diagnosis not present

## 2023-06-28 DIAGNOSIS — R630 Anorexia: Secondary | ICD-10-CM | POA: Diagnosis not present

## 2023-06-28 DIAGNOSIS — R627 Adult failure to thrive: Secondary | ICD-10-CM | POA: Diagnosis not present

## 2023-06-28 LAB — CBC
HCT: 21.8 % — ABNORMAL LOW (ref 39.0–52.0)
Hemoglobin: 6.8 g/dL — CL (ref 13.0–17.0)
MCH: 22.4 pg — ABNORMAL LOW (ref 26.0–34.0)
MCHC: 31.2 g/dL (ref 30.0–36.0)
MCV: 71.7 fL — ABNORMAL LOW (ref 80.0–100.0)
Platelets: 174 10*3/uL (ref 150–400)
RBC: 3.04 MIL/uL — ABNORMAL LOW (ref 4.22–5.81)
RDW: 24.4 % — ABNORMAL HIGH (ref 11.5–15.5)
WBC: 10.7 10*3/uL — ABNORMAL HIGH (ref 4.0–10.5)
nRBC: 0 % (ref 0.0–0.2)

## 2023-06-28 LAB — COMPREHENSIVE METABOLIC PANEL WITH GFR
ALT: 11 U/L (ref 0–44)
AST: 15 U/L (ref 15–41)
Albumin: 2.3 g/dL — ABNORMAL LOW (ref 3.5–5.0)
Alkaline Phosphatase: 52 U/L (ref 38–126)
Anion gap: 13 (ref 5–15)
BUN: 58 mg/dL — ABNORMAL HIGH (ref 6–20)
CO2: 21 mmol/L — ABNORMAL LOW (ref 22–32)
Calcium: 8.9 mg/dL (ref 8.9–10.3)
Chloride: 104 mmol/L (ref 98–111)
Creatinine, Ser: 8.63 mg/dL — ABNORMAL HIGH (ref 0.61–1.24)
GFR, Estimated: 7 mL/min — ABNORMAL LOW (ref >60)
Glucose, Bld: 120 mg/dL — ABNORMAL HIGH (ref 70–99)
Potassium: 3.8 mmol/L (ref 3.5–5.1)
Sodium: 138 mmol/L (ref 135–145)
Total Bilirubin: 0.8 mg/dL (ref 0.0–1.2)
Total Protein: 5.8 g/dL — ABNORMAL LOW (ref 6.5–8.1)

## 2023-06-28 LAB — GLUCOSE, CAPILLARY
Glucose-Capillary: 77 mg/dL (ref 70–99)
Glucose-Capillary: 77 mg/dL (ref 70–99)
Glucose-Capillary: 95 mg/dL (ref 70–99)

## 2023-06-28 LAB — PREPARE RBC (CROSSMATCH)

## 2023-06-28 MED ORDER — SODIUM CHLORIDE 0.9% IV SOLUTION: Freq: Once | INTRAVENOUS | Status: AC

## 2023-06-28 MED ORDER — CHLORHEXIDINE GLUCONATE CLOTH 2 % EX PADS
6.0000 | MEDICATED_PAD | Freq: Every day | CUTANEOUS | Status: DC
  Administered 2023-06-28: 6 via TOPICAL

## 2023-06-28 NOTE — TOC Progression Note (Signed)
 Transition of Care (TOC) - Progression Note    Patient Details  Name: [name]. MRN: [id number] Date of Birth: 1971/02/10  Transition of Care [facility]) CM/SW Contact  Tandy Fam, [place] Phone Number: 06/28/2023, 3:11 PM  Clinical Narrative:   CSW following for disposition. Patient with no bed offers for SNF at this time.    Expected Discharge Plan: Skilled Nursing Facility Barriers to Discharge: Continued Medical Work up, English as a second language teacher, SNF Pending bed offer  Expected Discharge Plan and Services In-house Referral: Clinical Social Work   Post Acute Care Choice: Skilled Nursing Facility Living arrangements for the past 2 months: Single Family Home                                       Social Determinants of Health (SDOH) Interventions SDOH Screenings   Food Insecurity: No Food Insecurity (06/21/2023)  Housing: High Risk (05/22/2023)  Transportation Needs: No Transportation Needs (05/22/2023)  Utilities: Patient Declined (06/21/2023)  Financial Resource Strain: High Risk (07/05/2017)  Physical Activity: Inactive (07/05/2017)  Social Connections: Socially Integrated (05/05/2023)  Stress: Stress Concern Present (07/05/2017)  Tobacco Use: Low Risk  (06/24/2023)  Health Literacy: Low Risk  (06/01/2020)   Received from [facility]    Readmission Risk Interventions    06/25/2023    9:22 AM 06/21/2023   10:51 AM 05/17/2023    3:44 PM  Readmission Risk Prevention Plan  Transportation Screening Complete Complete Complete  Medication Review (RN Care Manager) Complete Complete Referral to Pharmacy  PCP or Specialist appointment within 3-5 days of discharge Complete  Complete  HRI or Home Care Consult Complete Complete Complete  SW Recovery Care/Counseling Consult Complete Complete Complete  Palliative Care Screening Not Applicable Not Applicable Not Applicable  Skilled Nursing Facility Complete Not Applicable Not Applicable

## 2023-06-28 NOTE — Progress Notes (Signed)
 Physical Therapy Treatment Patient Details Name: [name]. MRN: [id number] DOB: [birth year] Today's Date: 06/28/2023   History of Present Illness Pt is a 53 yr old male presenting 4/30 from PCP with hypoglycemia and general weakness. Pt also with limited PO intake prior to admission. 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 Additionally, pt noted to be + for Cdiff.  PMHx: ESRD on HD TTS, R AKA (Dr. Julio Ohm 2/25), CVA, atrial fibrillation not on anticoagulation, DM2, HTN, and seizure disorder.    PT Comments  Patient received in bed, he is receiving blood transfusion, he is agreeable to "try" mobility. Patient with minimal initiation to get self to side of bed, requiring total assist. Not really following direction for best way to achieve this. Slightly agitated with me asking about his pain, stating "don't ask questions". Patient did participate in B LE strengthening exercises with minimal effort given. He will continue to benefit from skilled PT to improve strength and independence.       If plan is discharge home, recommend the following: Two people to help with walking and/or transfers;Help with stairs or ramp for entrance;Assist for transportation;Assistance with cooking/housework;Supervision due to cognitive status   Can travel by private vehicle     No  Equipment Recommendations  Other (comment) (TBD)    Recommendations for Other Services       Precautions / Restrictions Precautions Precautions: Fall Precaution/Restrictions Comments: bilateral AKA, L LE wound vac Restrictions Weight Bearing Restrictions Per Provider Order: Yes LLE Weight Bearing Per Provider Order: Non weight bearing     Mobility  Bed Mobility Overal bed mobility: Needs Assistance Bed Mobility: Rolling, Sidelying to Sit           General bed mobility comments: patient cued to reach for bed rail with L UE, generally does not try to reach for it stating it's too hard. Wants me to pull  him up.    Transfers                   General transfer comment: pt refused, too hard, pain    Ambulation/Gait               General Gait Details: unable   Stairs             Wheelchair Mobility     Tilt Bed    Modified Rankin (Stroke Patients Only)       Balance                                            Communication Communication Communication: No apparent difficulties  Cognition Arousal: Alert Behavior During Therapy: Agitated   PT - Cognitive impairments: No family/caregiver present to determine baseline                       PT - Cognition Comments: pt resistant to any mobility despite possible complications from continued immobility. Following commands: Impaired Following commands impaired: Follows one step commands inconsistently    Cueing Cueing Techniques: Verbal cues  Exercises Amputee Exercises Quad Sets: AROM, Both, 5 reps Gluteal Sets: AROM, Both, 5 reps Towel Squeeze: AROM, Both, 5 reps Hip ABduction/ADduction: AROM, Both, 5 reps    General Comments        Pertinent Vitals/Pain Pain Assessment Pain Assessment: 0-10 Pain Score: 10-Worst pain ever Pain  Location: L LE Pain Descriptors / Indicators: Discomfort, Sore Pain Intervention(s): Monitored during session, Repositioned    Home Living                          Prior Function            PT Goals (current goals can now be found in the care plan section) Acute Rehab PT Goals Patient Stated Goal: Improve strength, independence and hopefully walk with prosthetic PT Goal Formulation: With patient Time For Goal Achievement: 07/07/23 Potential to Achieve Goals: Fair Progress towards PT goals: Not progressing toward goals - comment (patient pain limited, not motivated)    Frequency    Min 1X/week      PT Plan      Co-evaluation              AM-PAC PT "6 Clicks" Mobility   Outcome Measure  Help needed  turning from your back to your side while in a flat bed without using bedrails?: Total Help needed moving from lying on your back to sitting on the side of a flat bed without using bedrails?: Total Help needed moving to and from a bed to a chair (including a wheelchair)?: Total Help needed standing up from a chair using your arms (e.g., wheelchair or bedside chair)?: Total Help needed to walk in hospital room?: Total Help needed climbing 3-5 steps with a railing? : Total 6 Click Score: 6    End of Session   Activity Tolerance: Patient limited by pain;Patient limited by fatigue Patient left: in bed;with call bell/phone within reach;with bed alarm set Nurse Communication: Mobility status PT Visit Diagnosis: Other abnormalities of gait and mobility (R26.89);Pain;Muscle weakness (generalized) (M62.81);Adult, failure to thrive (R62.7) Pain - Right/Left: Left Pain - part of body: Leg     Time: 1435-1445 PT Time Calculation (min) (ACUTE ONLY): 10 min  Charges:    $Therapeutic Exercise: 8-22 mins PT General Charges $$ ACUTE PT VISIT: 1 Visit                     Tommi Crepeau, PT, GCS 06/28/23,2:58 PM

## 2023-06-28 NOTE — Progress Notes (Signed)
 Progress Note   Patient: [name] Giannone Jr. WUJ:[id number] DOB: 09-15-1970 DOA: 06/20/2023     7 DOS: the patient was seen and examined on 06/28/2023   Brief hospital course: 53 y.o. 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 Incidentally noted to have infected ischemic wound of the left foot.   Assessment and Plan: Syncopal episode  w Hypoglycemic episode  -Reportedly slumped over and unconscious in PCP office. CBG 60 at the time;  - He is not on DM medications, does have poor oral intake.  - Periods of hypoglycemia noted likely secondary to decreased PO intake. Cont hypoglycemic protocol as needed   Infected ischemic ulcer of the left heel: -History of gangrenous wounds on the right and has ultimately had a right AKA.  - On the left she has developed an ischemic wound over the heel which has been dry.     -Orthopedic Surgery consulted, now s/p L AKA 5/2 -Pt was initially on empiric vancomycin , ceftriaxone , Flagyl . Discontinued abx following amputation -Wound care was consulted -Wound vac in place   Failure to thrive Progressive weakness now wheelchair-bound and using a Hoyer; despite recent SNF stay. diminished oral intake only taking 1 meal per day. -Nutrition consulted -PT/OT consulted, recs for SNF   Chronic myocardial injury No history of chest pain.   -EKG does have ST depression and T wave inversion laterally, T wave inversion inferiorly.   - High-sensitivity troponin low level elevated and flat 28 -> 30 which is  consistent with his chronic troponin.  Likely in the setting of underlying CAD, ESRD -Continued management for his chronic CAD per below   Sacral wound WOC consulted per above   Diarrhea with Cdiff and norovirus present on admission - GI pathogen pos for norovirus.  -Cdif antigen pos, toxin neg. C diff PCR pos -Continued on PO vanc -Stool is becoming more formed. Rectal tube removed 5/4 -Improving   Chronic medical problems:   ESRD,Tu/Th/Sat HD:  -Nephroogy consulted for HD   CAD history STEMI / Hx CVA: Continue home aspirin , atorvastatin  80 mg,  Coreg  12.5 mg twice daily   HFpEF: Without acute exacerbation.  Not on diuretics   Paroxysmal A-fib off anticoagulation: Currently in sinus rhythm, not on anticoagulation.  Continue home amiodarone , coreg  per above.    Hx IPH: Noted, off AC.    Seizure disorder: Continue home Keppra  500 mg nondialysis days, 750 mg on HD days.   DM type II diet controlled: Recent hypoglycemia, not on diabetic medications.  Every 6hr CBG checks, hypoglycemia protocol  -Periods of low glucose secondary to decreased PO intake, cont on dextrose  as needed   Hypertension: continue home antihypertensives per above Chronic anemia: Hx anemia CKD, defer to nephro re: Epo. Hgb down to 6.8 this AM. Odered 1 unit PRBC   Enucleation OS, hx DM retinopathy, glaucoma: Noted, ophtho f/u OP.      Subjective: Still complaining of diarrhea although stools had been becoming more formed  Physical Exam: Vitals:   06/28/23 0850 06/28/23 1242 06/28/23 1317 06/28/23 1530  BP: 136/76 (!) 132/51 (!) 110/30 Aaron Aas)  151/72  Pulse: 71 70 70 66  Resp: 18 19 18    Temp: 97.9 F (36.6 C) 98.6 F (37 C) 98.7 F (37.1 C) 98.3 F (36.8 C)  TempSrc:  Oral Oral Oral  SpO2: 92% 97% 98% 99%  Weight:      Height:       General exam: Awake, laying in bed, in nad Respiratory system: Normal respiratory effort, no wheezing Cardiovascular system: regular rate, s1, s2 Gastrointestinal  system: Soft, nondistended, positive BS Central nervous system: CN2-12 grossly intact, strength intact Extremities: no clubbing, s/p BLE amputation Skin: Normal skin turgor, no notable skin lesions seen Psychiatry: Mood normal // no visual hallucinations   Data Reviewed:  There are no new results to review at this time.  Family Communication: Pt in room, family not at bedside  Disposition: Status is: Inpatient Remains inpatient appropriate because: severity of illness  Planned Discharge Destination: Skilled nursing facility    Author: Cherylle Corwin, MD 06/28/2023 3:53 PM  For on call review [email or web address].

## 2023-06-28 NOTE — Progress Notes (Signed)
 [name] hgb was critically low at 6.8, pt remains asymptomatic, no known source of bleeding was seen, MD was notified and ordered 1 unit of blood to be given. 1 unit of blood was given without any reaction. Pending repeat H&H.

## 2023-06-28 NOTE — Progress Notes (Addendum)
 [facility] Progress Note   Subjective:    Seen and examined patient at bedside. He reports tolerating HD yesterday. Appears he was ran even. He denies SOB, CP, and N/V. Next HD 5/7 off schedule. Noted current Hgb 6.8 and 1 unit PRBCs already ordered by primary  Objective Vitals:   06/27/23 1557 06/27/23 2054 06/28/23 0517 06/28/23 0850  BP: (!) 185/77 (!) 175/79 (!) 142/68 136/76  Pulse: 75 78 70 71  Resp: 18 18 18 18   Temp:  99.6 F (37.6 C) 99.1 F (37.3 C) 97.9 F (36.6 C)  TempSrc:  Oral    SpO2: 100% 95% 95% 92%  Weight:      Height:       Physical Exam General: Alert, oriented, NAD; on RA Heart: RRR, no murmurs, rubs or gallops Lungs: CTA bilaterally, respirations unlabored Abdomen: Soft, non-distended, +BS Extremities: L stump w/wound vac, no edema RLE Dialysis Access: LUE AVF + t/b  Filed Weights   06/22/23 0811 06/22/23 1150 06/24/23 1225  Weight: 75.8 kg 72.8 kg 79.4 kg    Intake/Output Summary (Last 24 hours) at 06/28/2023 1015 Last data filed at 06/28/2023 0900 Gross per 24 hour  Intake 350 ml  Output 0 ml  Net 350 ml    Additional Objective Labs: Basic Metabolic Panel: Recent Labs  Lab 06/23/23 0526 06/24/23 1305 06/25/23 0733 06/26/23 0721  NA 140 137 146* 144  K 4.5 4.8 4.4 4.6  CL 103 110 110 114*  CO2 22  --  18* 15*  GLUCOSE 88 67* 97 104*  BUN 53* 58* 69* 83*  CREATININE 9.15* 11.40* 11.76* 13.09*  CALCIUM  8.8*  --  9.5 9.0  PHOS  --   --  9.4*  --    Liver Function Tests: Recent Labs  Lab 06/23/23 0526 06/25/23 0733 06/26/23 0721  AST 11* 17 11*  ALT 11 11 12   ALKPHOS 62 55 54  BILITOT 0.7 0.6 0.6  PROT 6.0* 5.6* 5.6*  ALBUMIN  2.4* 2.4* 2.3*   No results for input(s): "LIPASE", "AMYLASE" in the last 168 hours. CBC: Recent Labs  Lab 06/22/23 0821 06/23/23 0526 06/24/23 1305 06/25/23 0726 06/26/23 0721  WBC 7.4 7.9  --  14.4* 14.3*  HGB 7.6* 7.9* 8.2* 7.0* 7.0*  HCT 25.5* 25.8* 24.0* 22.9* 23.5*  MCV  72.6* 70.7*  --  71.8* 74.6*  PLT 97* 113*  --  127* 141*   Blood Culture    Component Value Date/Time   SDES BLOOD RIGHT ARM 06/25/2023 0911   SPECREQUEST  06/25/2023 0911    BOTTLES DRAWN AEROBIC AND ANAEROBIC Blood Culture adequate volume   CULT  06/25/2023 0911    NO GROWTH 3 DAYS Performed at [facility], [address] [place], [place] [address]    REPTSTATUS PENDING 06/25/2023 6045    Cardiac Enzymes: No results for input(s): "CKTOTAL", "CKMB", "CKMBINDEX", "TROPONINI" in the last 168 hours. CBG: Recent Labs  Lab 06/26/23 2241 06/27/23 1303 06/27/23 2341 06/28/23 0519 06/28/23 0738  GLUCAP 137* 88 97 77 77   Iron Studies: No results for input(s): "IRON", "TIBC", "TRANSFERRIN", "FERRITIN" in the last 72 hours. Lab Results  Component Value Date   INR 1.2 05/06/2023   INR 1.2 05/05/2023   INR 1.1 03/01/2022   Studies/Results: No results found.  Medications:  sodium chloride  10 mL/hr at 06/24/23 2003    sodium chloride    Intravenous Once   acidophilus  2 capsule Oral TID   amiodarone   200  mg Oral Daily   vitamin C   1,000 mg Oral Daily   aspirin  EC  81 mg Oral Q breakfast   atorvastatin   80 mg Oral QHS   calcium  acetate  1,334 mg Oral TID WC   carvedilol   25 mg Oral BID   darbepoetin (ARANESP ) injection - NON-DIALYSIS  100 mcg Subcutaneous Q Thu-1800   feeding supplement (NEPRO CARB STEADY)  237 mL Oral TID BM   heparin   5,000 Units Subcutaneous Q8H   levETIRAcetam   500 mg Oral Once per day on Sunday Tuesday Thursday Saturday   levETIRAcetam   750 mg Oral Once per day on Monday Wednesday Friday   liver oil-zinc  oxide   Topical BID   multivitamin  1 tablet Oral QHS   nutrition supplement (JUVEN)  1 packet Oral BID BM   pregabalin  50 mg Oral Daily   sodium chloride  flush  3 mL Intravenous Q12H   vancomycin   125 mg Oral QID   zinc  sulfate (50mg  elemental zinc )  220 mg Oral Daily    Dialysis Orders: [facility] TTS 3 hr  edw 77kg,   LUE AVF   Heparin  1000 bolus + 400u/hr Mircera 100 mcg  q 2wks due  4/28 ( was no show)  No vitamin D   Assessment/Plan: Infected wound L foot: hx of R AKA for similar issues. S/p L AKA  Failure to thrive: per pmd. ESRD: on HD TTS.  Off schedule this week, last HD Wednesday and did not have HD 5/2 due to surgery, refused HD 5/2 due to diarrhea. Received HD yesterday. Next HD 5/7 and plan to transition back to TTS later this week. HTN: Bp soft post op but now seems to be improving. Noted occasional spikes.  Anemia of esrd: Hgb now 6.8 and 1 unit PRBCs already ordered by primary. Continue to transfuse if Hgb < 7. Secondary hyperparathyroidism: CCa in range, phosphorus elevated. Continue phosphorus binder with meals.  Nutrition: optimize as able Seizure d/o - on keppra  DM2 - per pmd  [name], NP [facility] 06/28/2023,10:15 AM  LOS: 7 days

## 2023-06-28 NOTE — Progress Notes (Signed)
 Contacted [facility] to inquire about pt's chair time. Pt arrives at 5:45 am for 6:00 am chair time on TTS. Info provided to CSW for d/c planning purposes. Will assist as needed.   Lauraine Polite Renal Navigator [phone]

## 2023-06-29 ENCOUNTER — Encounter: Payer: Self-pay | Admitting: Orthopedic Surgery

## 2023-06-29 DIAGNOSIS — R627 Adult failure to thrive: Secondary | ICD-10-CM | POA: Diagnosis not present

## 2023-06-29 DIAGNOSIS — E440 Moderate protein-calorie malnutrition: Secondary | ICD-10-CM | POA: Insufficient documentation

## 2023-06-29 LAB — CBC
HCT: 23.3 % — ABNORMAL LOW (ref 39.0–52.0)
Hemoglobin: 7.2 g/dL — ABNORMAL LOW (ref 13.0–17.0)
MCH: 23.2 pg — ABNORMAL LOW (ref 26.0–34.0)
MCHC: 30.9 g/dL (ref 30.0–36.0)
MCV: 74.9 fL — ABNORMAL LOW (ref 80.0–100.0)
Platelets: 188 10*3/uL (ref 150–400)
RBC: 3.11 MIL/uL — ABNORMAL LOW (ref 4.22–5.81)
RDW: 23.9 % — ABNORMAL HIGH (ref 11.5–15.5)
WBC: 11.4 10*3/uL — ABNORMAL HIGH (ref 4.0–10.5)
nRBC: 0 % (ref 0.0–0.2)

## 2023-06-29 LAB — BPAM RBC
Blood Product Expiration Date: 202505302359
ISSUE DATE / TIME: 202505061237
Unit Type and Rh: 6200

## 2023-06-29 LAB — COMPREHENSIVE METABOLIC PANEL WITH GFR
ALT: 30 U/L (ref 0–44)
AST: 43 U/L — ABNORMAL HIGH (ref 15–41)
Albumin: 2.1 g/dL — ABNORMAL LOW (ref 3.5–5.0)
Alkaline Phosphatase: 54 U/L (ref 38–126)
Anion gap: 13 (ref 5–15)
BUN: 81 mg/dL — ABNORMAL HIGH (ref 6–20)
CO2: 21 mmol/L — ABNORMAL LOW (ref 22–32)
Calcium: 9.1 mg/dL (ref 8.9–10.3)
Chloride: 107 mmol/L (ref 98–111)
Creatinine, Ser: 10.31 mg/dL — ABNORMAL HIGH (ref 0.61–1.24)
GFR, Estimated: 5 mL/min — ABNORMAL LOW (ref >60)
Glucose, Bld: 80 mg/dL (ref 70–99)
Potassium: 4.5 mmol/L (ref 3.5–5.1)
Sodium: 141 mmol/L (ref 135–145)
Total Bilirubin: 0.8 mg/dL (ref 0.0–1.2)
Total Protein: 5.7 g/dL — ABNORMAL LOW (ref 6.5–8.1)

## 2023-06-29 LAB — TYPE AND SCREEN
ABO/RH(D): A POS
Antibody Screen: NEGATIVE
Unit division: 0

## 2023-06-29 LAB — GLUCOSE, CAPILLARY: Glucose-Capillary: 115 mg/dL — ABNORMAL HIGH (ref 70–99)

## 2023-06-29 MED ORDER — HYDROMORPHONE HCL 1 MG/ML IJ SOLN
INTRAMUSCULAR | Status: AC
  Filled 2023-06-29: qty 1

## 2023-06-29 MED ORDER — LIDOCAINE-PRILOCAINE 2.5-2.5 % EX CREA: 1.0000 | TOPICAL_CREAM | CUTANEOUS | Status: DC | PRN

## 2023-06-29 MED ORDER — LIDOCAINE HCL (PF) 1 % IJ SOLN: 5.0000 mL | INTRAMUSCULAR | Status: DC | PRN

## 2023-06-29 MED ORDER — ALTEPLASE 2 MG IJ SOLR: 2.0000 mg | Freq: Once | INTRAMUSCULAR | Status: DC | PRN

## 2023-06-29 MED ORDER — PENTAFLUOROPROP-TETRAFLUOROETH EX AERO: 1.0000 | INHALATION_SPRAY | CUTANEOUS | Status: DC | PRN

## 2023-06-29 MED ORDER — HEPARIN SODIUM (PORCINE) 1000 UNIT/ML DIALYSIS: 1000.0000 [IU] | INTRAMUSCULAR | Status: DC | PRN

## 2023-06-29 NOTE — Progress Notes (Signed)
 PROGRESS NOTE  [name] Carla Charon. 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  He was found to have an infected ischemic wound of the left foot, eventually underwent left AKA on 5/2  Subjective / 24h Interval events: Seen in HD, sleepy but wakes up and has no complaints. No abdominal pain, no nausea/vomiting.  Assesement and Plan: Active Problems:   C. difficile colitis   Syncope   Hypoglycemia   Ischemic ulcer of left heel (HCC)   Failure to thrive in adult   Gangrene of left foot (HCC)   Malnutrition of moderate degree  Principal problem Syncopal episode, hypoglycemia -slumped over and unconscious in PCPs office.  This is likely due to poor p.o. intake - Encourage p.o. intake, has not had any further hypoglycemic episodes  Active problems Infected ischemic ulcer of the left heel - History of gangrenous wounds on the right and has ultimately had a right AKA.  On admission he was found to have an infected ulcer of the left heel, orthopedic surgery consulted, he is now status post left AKA on 5/2.  Initially he was maintained on broad-spectrum antibiotics, now discontinued followed by amputation.  Wound VAC in place  Diarrhea -positive for C. difficile and norovirus on admission.  Continue p.o. vancomycin , diarrhea now improving.  Required rectal tube initially, now removed on 5/4  Failure to thrive - Progressive weakness now wheelchair-bound and using a Hoyer; despite recent SNF stay. diminished oral intake only taking 1 meal per day. - Nutrition consulted - PT/OT consulted, recs for SNF   Troponin elevation - No  history of chest pain. High - sensitivity troponin low level elevated and flat 28 -> 30 which is consistent with demand ischemia, also underlying ESRD.  - Continue aspirin , statin, Coreg    Sacral wound - WOC consulted per above   ESRD,Tu/Th/Sat HD -Nephroogy consulted for HD   CAD history STEMI / Hx CVA - Continue home aspirin , atorvastatin  80 mg,  Coreg  12.5 mg twice daily   HFpEF -  Without acute exacerbation.  Not on diuretics   Paroxysmal A-fib off anticoagulation - currently in sinus rhythm, not on anticoagulation.  Continue home amiodarone , coreg  per above.    Hx IPH - Noted, off AC.    Seizure disorder - Continue home Keppra  500 mg nondialysis days, 750 mg on HD days.   DM type II diet controlled - Recent hypoglycemia, not on diabetic medications.  Every 6hr CBG checks, hypoglycemia protocol    Hypertension - continue home antihypertensives per above  Chronic anemia - Hx anemia CKD, received 2 units of packed red blood cells.  Hemoglobin 7.2.  Transfuse for hemoglobin less than 7   Enucleation OS, hx DM retinopathy, glaucoma - Noted, ophtho f/u OP.   Scheduled Meds:  acidophilus  2 capsule Oral TID   amiodarone   200 mg Oral Daily   vitamin C   1,000 mg Oral Daily   aspirin  EC  81 mg Oral Q breakfast   atorvastatin   80 mg Oral QHS   calcium  acetate  1,334 mg Oral TID WC   carvedilol   25 mg Oral BID   Chlorhexidine  Gluconate Cloth  6 each Topical Q0600   darbepoetin (ARANESP ) injection - NON-DIALYSIS  100 mcg Subcutaneous Q Thu-1800   feeding supplement (NEPRO CARB STEADY)  237 mL Oral TID BM   heparin   5,000 Units Subcutaneous Q8H   levETIRAcetam   500 mg Oral Once per day on Sunday Tuesday Thursday Saturday   levETIRAcetam  750 mg Oral Once per day on Monday Wednesday Friday   liver oil-zinc oxide   Topical BID   multivitamin  1 tablet Oral QHS   nutrition supplement (JUVEN)  1 packet Oral BID BM   pregabalin  50 mg Oral Daily   sodium chloride flush  3 mL Intravenous  Q12H   vancomycin  125 mg Oral QID   zinc sulfate (50mg elemental zinc)  220 mg Oral Daily   Continuous Infusions:  sodium chloride 10 mL/hr at 06/24/23 2003   PRN Meds:.acetaminophen, hydrALAZINE, HYDROmorphone (DILAUDID) injection, melatonin, ondansetron (ZOFRAN) IV, polyethylene glycol  Current Outpatient Medications  Medication Instructions   acetaminophen (TYLENOL) 650 mg, Oral, Every 6 hours PRN   Amino Acids-Protein Hydrolys (PRO-STAT AWC) LIQD 30 mLs, Every morning   amiodarone (PACERONE) 200 mg, Oral, Daily   ammonium lactate (AMLACTIN DAILY) 12 % lotion Apply topically.   ascorbic acid (VITAMIN C) 1,000 mg, Daily   aspirin EC 81 mg, Oral, Daily with breakfast, Swallow whole.   atorvastatin (LIPITOR) 80 mg, Oral, Daily at bedtime   brimonidine (ALPHAGAN) 0.2 % ophthalmic solution 1 drop, Right Eye, 3 times daily   calcitRIOL (ROCALTROL) 2.25 mcg, Oral, Every T-Th-Sa (1800)   calcium acetate (PHOSLO) 1,334 mg, 3 times daily   carvedilol (COREG) 12.5 mg, Oral, 2 times daily   cinacalcet (SENSIPAR) 60 mg, Oral, Every T-Th-Sa (1800)   dorzolamide-timolol (COSOPT) 2-0.5 % ophthalmic solution 1 drop, 2 times daily   levETIRAcetam (KEPPRA) 500 mg, Oral, 4 times weekly, On Sunday, Monday, Wednesday and Friday, all non-dialysis days   levETIRAcetam (KEPPRA) 750 mg, Oral, 3 times weekly, On Tuesday Thursday and Saturday, on dialysis days   lidocaine-prilocaine (EMLA) cream 1 Application, 3 times weekly   loperamide (IMODIUM) 2 mg, Oral, As needed   multivitamin (RENA-VIT) TABS tablet 1 tablet, Oral, Daily at bedtime   Nutritional Supplements (,FEEDING SUPPLEMENT, PROSOURCE PLUS) liquid 30 mLs, Oral, 3 times daily between meals   ondansetron (ZOFRAN) 4 mg, Oral, Every 6 hours PRN   oxyCODONE (OXY IR/ROXICODONE) 5 mg, Oral, Every 4 hours PRN   polyethylene glycol (MIRALAX / GLYCOLAX) 17 g, Oral, Daily   Zinc Oxide (DESITIN MAXIMUM STRENGTH) 40 % PSTE 1 Application, Apply externally, 2  times daily PRN    Diet Orders (From admission, onward)     Start     Ordered   06/24/23 1938  Diet renal with fluid restriction Fluid restriction: 1200 mL Fluid; Room service appropriate? Yes; Fluid consistency: Thin  Diet effective now       Question Answer Comment  Fluid restriction: 1200 mL Fluid   Room service appropriate? Yes   Fluid consistency: Thin      05 /02/25 1937            DVT prophylaxis: heparin  injection 5,000 Units Start: 06/21/23 0600   Lab Results  Component Value Date   PLT 188 06/29/2023      Code Status: Limited: Do not attempt resuscitation (DNR) -DNR-LIMITED -Do Not Intubate/DNI   Family Communication: no family at bedside   Status is: Inpatient Remains inpatient appropriate because: placement   Level of care: Telemetry Medical  Consultants:  Nephrology   Objective: Vitals:   06/29/23 1000 06/29/23 1030 06/29/23 1100 06/29/23 1130  BP: 112/64 (!) 146/70 (!) 158/73 (!) 166/68  Pulse: 69 68 68 68  Resp: (!) 9 15 10 10   Temp:      TempSrc:      SpO2: 100% 100% 99% 99%  Weight:      Height:        Intake/Output Summary (Last 24 hours) at 06/29/2023 1136 Last data filed at 06/29/2023 0600 Gross per 24 hour  Intake 841.33 ml  Output 0 ml  Net 841.33 ml   Wt Readings from Last 3 Encounters:  06/29/23 69.9 kg  06/16/23 74.4 kg  05/30/23 74.4 kg    Examination:  Constitutional: NAD Eyes: no scleral icterus ENMT: Mucous membranes are moist.  Neck: normal, supple Respiratory: clear to auscultation bilaterally, no wheezing, no crackles. Normal respiratory effort. No accessory muscle use.  Cardiovascular: Regular rate and rhythm, no murmurs / rubs / gallops. No LE edema.  Abdomen: non distended, no tenderness. Bowel sounds positive.  Musculoskeletal: no clubbing / cyanosis.   Data Reviewed: I have independently reviewed following labs and imaging studies   CBC Recent Labs  Lab 06/23/23 0526 06/24/23 1305 06/25/23 0726  06/26/23 0721 06/28/23 0826 06/29/23 0829  WBC 7.9  --  14.4* 14.3* 10.7* 11.4*  HGB 7.9* 8.2* 7.0* 7.0* 6.8* 7.2*  HCT 25.8* 24.0* 22.9* 23.5* 21.8* 23.3*  PLT 113*  --  127* 141* 174 188  MCV 70.7*  --  71.8* 74.6* 71.7* 74.9*  MCH 21.6*  --  21.9* 22.2* 22.4* 23.2*  MCHC 30.6  --  30.6 29.8* 31.2 30.9  RDW 24.3*  --  24.7* 24.4* 24.4* 23.9*    Recent Labs  Lab 06/23/23 0526 06/24/23 1305 06/25/23 0733 06/26/23 0721 06/28/23 0826 06/29/23 0829  NA 140 137 146* 144 138 141  K 4.5 4.8 4.4 4.6 3.8 4.5  CL 103 110 110 114* 104 107  CO2 22  --  18* 15* 21* 21*  GLUCOSE 88 67* 97 104* 120* 80  BUN 53* 58* 69* 83* 58* 81*  CREATININE 9.15* 11.40* 11.76* 13.09* 8.63* 10.31*  CALCIUM  8.8*  --  9.5 9.0 8.9 9.1  AST 11*  --  17 11* 15 43*  ALT 11  --  11 12 11 30   ALKPHOS 62  --  55 54 52 54  BILITOT 0.7  --  0.6 0.6 0.8 0.8  ALBUMIN  2.4*  --  2.4* 2.3* 2.3* 2.1*    ------------------------------------------------------------------------------------------------------------------ No results for input(s): "CHOL", "HDL", "LDLCALC", "TRIG", "CHOLHDL", "LDLDIRECT" in the last 72 hours.  Lab Results  Component Value Date   HGBA1C 5.0 05/05/2023   ------------------------------------------------------------------------------------------------------------------ No results for input(s): "TSH", "T4TOTAL", "T3FREE", "THYROIDAB" in the last 72 hours.  Invalid input(s): "FREET3"  Cardiac Enzymes No results for input(s): "CKMB", "TROPONINI", "MYOGLOBIN" in the last 168 hours.  Invalid input(s): "CK" ------------------------------------------------------------------------------------------------------------------    Component Value Date/Time   BNP 1,416.0 (H) 11/21/2021 1117    CBG: Recent Labs  Lab 06/27/23 2341 06/28/23 0519 06/28/23 0738 06/28/23 1156 06/29/23 0008  GLUCAP 97 77 77 95 115*    Recent Results (from the past 240 hours)  Gastrointestinal Panel by PCR ,  Stool     Status: Abnormal   Collection Time: 06/21/23  3:56 AM   Specimen: Stool  Result Value Ref Range Status   Campylobacter species NOT DETECTED NOT DETECTED Final   Plesimonas shigelloides NOT DETECTED NOT DETECTED  Final   Salmonella species NOT DETECTED NOT DETECTED Final   Yersinia enterocolitica NOT DETECTED NOT DETECTED Final   Vibrio species NOT DETECTED NOT DETECTED Final   Vibrio cholerae NOT DETECTED NOT DETECTED Final   Enteroaggregative E coli (EAEC) NOT DETECTED NOT DETECTED Final   Enteropathogenic E coli (EPEC) NOT DETECTED NOT DETECTED Final   Enterotoxigenic E coli (ETEC) NOT DETECTED NOT DETECTED Final   Shiga like toxin producing E coli (STEC) NOT DETECTED NOT DETECTED Final   Shigella/Enteroinvasive E coli (EIEC) NOT DETECTED NOT DETECTED Final   Cryptosporidium NOT DETECTED NOT DETECTED Final   Cyclospora cayetanensis NOT DETECTED NOT DETECTED Final   Entamoeba histolytica NOT DETECTED NOT DETECTED Final   Giardia lamblia NOT DETECTED NOT DETECTED Final   Adenovirus F40/41 NOT DETECTED NOT DETECTED Final   Astrovirus NOT DETECTED NOT DETECTED Final   Norovirus GI/GII DETECTED (A) NOT DETECTED Final    Comment: RESULT CALLED TO, READ BACK BY AND VERIFIED WITH: TRAVIS ROSS RN @0229  06/22/23 ASW    Rotavirus A NOT DETECTED NOT DETECTED Final   Sapovirus (I, II, IV, and V) NOT DETECTED NOT DETECTED Final    Comment: Performed at [facility], [address] [place], [place] [address]  C Difficile Quick Screen w PCR reflex     Status: Abnormal   Collection Time: 06/21/23  3:56 AM   Specimen: STOOL  Result Value Ref Range Status   C Diff antigen POSITIVE (A) NEGATIVE Final   C Diff toxin NEGATIVE NEGATIVE Final   C Diff interpretation Results are indeterminate. See PCR results.  Final    Comment: Performed at [facility], [address], [place], [place] [address]  C. Diff by PCR, Reflexed     Status: Abnormal   Collection Time: 06/21/23  3:56 AM   Result Value Ref Range Status   Toxigenic C. Difficile by PCR POSITIVE (A) NEGATIVE Final    Comment: Positive for toxigenic C. difficile with little to no toxin production. Only treat if clinical presentation suggests symptomatic illness. Performed at [facility], [address], [place], [place] [address]   Surgical pcr screen     Status: None   Collection Time: 06/24/23 12:03 PM   Specimen: Nasal Mucosa; Nasal Swab  Result Value Ref Range Status   MRSA, PCR NEGATIVE NEGATIVE Final   Staphylococcus aureus NEGATIVE NEGATIVE Final    Comment: (NOTE) The Xpert SA Assay (FDA approved for NASAL specimens in patients 51 years of age and older), is one component of a comprehensive surveillance program. It is not intended to diagnose infection nor to guide or monitor treatment. Performed at [facility], [address] [place], [place] [address]   Culture, blood (Routine X 2) w Reflex to ID Panel     Status: None (Preliminary result)   Collection Time: 06/25/23  9:10 AM   Specimen: BLOOD RIGHT ARM  Result Value Ref Range Status   Specimen Description BLOOD RIGHT ARM  Final   Special Requests   Final    BOTTLES DRAWN AEROBIC AND ANAEROBIC Blood Culture adequate volume   Culture   Final    NO GROWTH 4 DAYS Performed at [facility], [address], [place], [place] [address]    Report Status PENDING  Incomplete  Culture, blood (Routine X 2) w Reflex to ID Panel     Status: None (Preliminary result)   Collection Time: 06/25/23  9:11 AM   Specimen: BLOOD RIGHT  ARM  Result Value Ref Range Status   Specimen Description BLOOD RIGHT ARM  Final   Special Requests   Final    BOTTLES DRAWN AEROBIC AND ANAEROBIC Blood Culture adequate volume   Culture   Final    NO GROWTH 4 DAYS Performed at [facility], [address], [place], [place] [address]    Report Status PENDING  Incomplete     Radiology Studies: No results found.   Kathlen Para, MD, PhD [facility]  Between 7 am - 7 pm I am available, please contact me via Amion (for emergencies) or Securechat (non urgent messages)  Between 7 pm - 7 am I am not available, please contact night coverage MD/APP via Amion

## 2023-06-29 NOTE — Progress Notes (Signed)
 Post-Op Visit Note   Patient: [name].           Date of Birth: September 06, 1970           MRN: [id number] Visit Date: 06/14/2023 PCP: Ayesha Lente, FNP  Chief Complaint:  Chief Complaint  Patient presents with   Right Leg - Routine Post Op    05/11/2023 right AKA    HPI:  HPI The patient is a 53 year old gentleman seen status post right above-knee amputation March 19.  This is his first postop visit. Ortho Exam On examination right above-knee amputation this appears to be healing well sutures are in place there is no gaping or drainage consolidating well.  Visit Diagnoses: No diagnosis found.  Plan: Sutures harvested he will continue daily dose of cleansing shrinker around-the-clock once obtained  Follow-Up Instructions: No follow-ups on file.   Imaging: No results found.  Orders:  No orders of the defined types were placed in this encounter.  No orders of the defined types were placed in this encounter.    PMFS History: Patient Active Problem List   Diagnosis Date Noted   Malnutrition of moderate degree 06/29/2023   Gangrene of left foot (HCC) 06/24/2023   Syncope 06/21/2023   Hypoglycemia 06/21/2023   Ischemic ulcer of left heel (HCC) 06/21/2023   Failure to thrive in adult 06/21/2023   Generalized weakness 05/22/2023   Hx of AKA (above knee amputation), right (HCC) 05/11/2023   Pleural effusion 05/05/2023   Seizures (HCC) 05/05/2023   Right BKA infection (HCC) 04/04/2023   Pressure injury of skin 04/04/2023   Atherosclerosis of native artery of right leg with gangrene (HCC) 04/02/2023   Weakness 03/24/2023   Diabetic hypoglycemia (HCC) 02/13/2023   S/P BKA (below knee amputation), right (HCC) - 02-09-2023 02/09/2023   Gangrene of right foot (HCC) 02/07/2023   Acute on chronic anemia 02/26/2022   H/O spontaneous intraventricular intracranial hemorrhage 10/2021 - while on plavix  and Eliquis  02/26/2022   History of CVA (cerebrovascular accident) -  with ICH while on plavix  and Eliquis  in 11-2021. 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 difficile colitis 05/05/2021   Steal syndrome as complication of dialysis access (HCC) 02/06/2021   Coronary artery disease 02/05/2021   Dehiscence of amputation stump of right lower extremity (HCC) 01/29/2021   ESRD on hemodialysis (HCC)    Anemia due to chronic kidney disease, on chronic dialysis (HCC)    Paroxysmal atrial fibrillation (HCC) - not on systemic anticoagulation due to ICH while on Eliquis  in 11-2021. 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) 08/19/2020   Anxiety state 08/19/2020   Callosity 08/19/2020   ED (erectile dysfunction) of organic origin 08/19/2020   Long term (current) use of insulin  (HCC) 08/19/2020   Myalgia 08/19/2020   CAD/ H/o prior STEMI 02/2020 s/p DES to LAD 08/19/2020   Blindness of left eye 08/15/2020   Combined forms of age-related cataract of right eye 04/17/2020   Moderate protein-calorie malnutrition (HCC) 05/14/2019   Disorder of phosphorus metabolism, unspecified 05/07/2019   Iron deficiency anemia, unspecified 05/07/2019   Secondary hyperparathyroidism of renal origin (HCC) 11/28/2018   Chronic Grade 2 diastolic heart failure/EF 55 to 60% 12/13/2017   Diabetes mellitus, type II (HCC) 11/23/2017   Diabetic retinopathy (HCC) 07/05/2017   Uncontrolled type 2 diabetes  mellitus with hyperglycemia, with long-term current use of insulin  (HCC) 04/20/2016   Mixed hyperlipidemia    Essential hypertension, benign    Past Medical History:  Diagnosis Date   Acute Bil  CVAs of Bil Frontal amd Rt Parietal  in Watershed Distribution 04/21/2021   Patchy acute cortically-based infarcts within the bilateral high  frontal lobes and right parietal lobe (predominantly in a watershed  distribution).    Subcentimeter acute infarct within the callosal splenium on the  left.     Acute bilateral cerebral infarction in a watershed distribution [facility]) 04/21/2021   a.) MRI brain 04/21/2021 --> patchy ACUTE cortically-based infarcts within the bilateral high frontal lobes and right parietal lobe   Acute cerebral infarction (HCC) 02/13/2021   a.) MRI brain 02/13/2021: acute LEFT hippocampal infarct   Acute cerebral infarction (HCC) 04/21/2021   a.) MRI brain 04/21/2021: subcentimeter ACUTE infarct within the callosal splenium on the LEFT   Acute osteomyelitis of toe of right foot with gangrene (HCC) 02/26/2022   Anaphylactic reaction due to adverse effect of correct drug or medicament properly administered, initial encounter 05/08/2019   Anemia of chronic renal failure    Anxiety    Aortic dilatation (HCC) 11/25/2020   a.) TTE 11/25/2020: Ao root measured 38 mm. b.) TTE 04/22/2021: Ao root measured 44 mm; c. 11/2021 Echo: Ao root 40mm.   Atrial fibrillation (HCC)    a.) CHA2DS2-VASc = 6 (CHF, HTN, CVA x2, prior MI, T2DM). b.) rate/rhythm maintained on oral amiodarone  + carvedilol ; chronic OAC/AP therapy discontinued following ICH   Benign prostatic hyperplasia with lower urinary tract symptoms 11/07/2019   BPH (benign prostatic hyperplasia)    Cardiac arrest (HCC) 07/26/2021   a.) in setting of hyperkalemia, NSTEMI, and seizure following missed HD; pulseless and apneic --> required 1 round of CPR prior to ROSC   Cellulitis    Cerebral hemorrhage (HCC) 11/21/2021   a.) CT head 11/21/2021 --> 1.4 x 1.5 x 3.1 cm ACUTE hemorrhage within the inferior aspect of the fourth ventricle, and extending inferiorly through the foramen of foramen of [place] --> occurred in setting of HTN emergency and DOAC/APT-->eliquis /plavix  d/c'd.   Cerebral microvascular disease    Chronic diarrhea    Chronic heart failure with preserved ejection fraction (HFpEF) (HCC) 07/19/2017   a.) 06/2017 Echo: EF 75%; b.) 11/2020 Echo:  EF 55-60%; c.) 04/2021 Echo: EF 55-60%; d.) 11/2021 Echo: EF 65-70%, GrII DD, nl RV fxn, sev dil LA, large circumferential pericardial eff w/o tamponade, triv MR, AoV sclerosis. Ao root 40mm; e.) TTE 02/28/2025: EF 60-65%, sev LVH, mod posterior LV effusion, AoV sclerosis   Chronic pain of both ankles    Coronary artery disease 02/26/2020   a. 02/2020 Ant STEMI/PCI: LM nl, LAD 42m (3.5x30 Resolute Onyx), RI nl, LCX min irregs, RCA min irregs. EF 65%.   DDD (degenerative disc disease), cervical    Diabetes mellitus, type II (HCC)    Diabetic neuropathy (HCC)    Esophagitis    ESRD (end stage renal disease) on dialysis (HCC)    a.) [facility]; T-Th-Sat   Frequent falls    Gait instability    Gastritis    Gastroparesis    GERD (gastroesophageal reflux disease)    H/O enucleation of left eyeball    Heart palpitations    History of kidney stones    HLD (hyperlipidemia)    Hypertension    Insomnia    a.) uses melatonin PRN   Long term current use of amiodarone   Nausea and vomiting in adult    recurrent   NSTEMI (non-ST elevated myocardial infarction) (HCC) 07/26/2021   a.) hyperkalemic from missed HD; seizures; decompensated to cardiac arrest and required CPR x 1 round prior to ROSC.   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  Added automatically from request for surgery [id number]     Osteomyelitis [facility])    Pericardial effusion    a. 11/2021 Echo: EF 65-70%, GrII DD, nl RV fxn, sev dil LA, large circumferential pericardial eff w/o tamponade, triv MR, AoV sclerosis. Ao root 40mm.   Seizure (HCC)    a.) last 07/26/2021 in setting of missed HD --> hyperkalemic at 6.5 --> pulseless/apneic and required CPR; discharged home on levetiracetam .   ST elevation myocardial infarction (STEMI) of anterior wall (HCC) 02/26/2020   a.) LHC/PCI 02/26/2020 --> EF 65%; LVEDP 11 mmHg; 90% mLAD (3.5 x 20 mm Resolute Onyx DES x  1)    Family History  Problem Relation Age of Onset   CAD Father    Stroke Father    Diabetes Mellitus II Mother    Kidney failure Mother    Schizophrenia Mother     Past Surgical History:  Procedure Laterality Date   A/V FISTULAGRAM Left 08/31/2021   Procedure: A/V Fistulagram;  Surgeon: Celso College, MD;  Location: [name] INVASIVE CV LAB;  Service: Cardiovascular;  Laterality: Left;   A/V FISTULAGRAM Left 01/25/2022   Procedure: A/V Fistulagram;  Surgeon: Celso College, MD;  Location: [name] INVASIVE CV LAB;  Service: Cardiovascular;  Laterality: Left;   A/V FISTULAGRAM Right 10/18/2022   Procedure: A/V Fistulagram;  Surgeon: Celso College, MD;  Location: [name] INVASIVE CV LAB;  Service: Cardiovascular;  Laterality: Right;   ABDOMINAL AORTOGRAM W/LOWER EXTREMITY Bilateral 02/08/2023   Procedure: ABDOMINAL AORTOGRAM W/LOWER EXTREMITY;  Surgeon: Philipp Brawn, MD;  Location: [facility] INVASIVE CV LAB;  Service: Vascular;  Laterality: Bilateral;   AMPUTATION Left 03/16/2016   Procedure: AMPUTATION DIGIT LEFT HALLUX;  Surgeon: Charity Conch, DPM;  Location: [facility] OR;  Service: Podiatry;  Laterality: Left;  can start around 5    AMPUTATION Right 02/09/2021   Procedure: AMPUTATION DIGIT;  Surgeon: Marilyn Shropshire, MD;  Location: [facility] OR;  Service: Orthopedics;  Laterality: Right;   AMPUTATION Right 02/04/2023   Procedure: AMPUTATION 4TH AND 5TH METATARSAL;  Surgeon: Evertt Hoe, DPM;  Location: [facility] OR;  Service: Orthopedics/Podiatry;  Laterality: Right;  I&D, partial ray resection, abx beads   AMPUTATION Right 02/09/2023   Procedure: RIGHT BELOW KNEE AMPUTATION;  Surgeon: Timothy Ford, MD;  Location: [facility] OR;  Service: Orthopedics;  Laterality: Right;   AMPUTATION Right 04/06/2023   Procedure: RIGHT ABOVE KNEE AMPUTATION;  Surgeon: Timothy Ford, MD;  Location: [facility] OR;  Service: Orthopedics;  Laterality: Right;   AMPUTATION Right 05/11/2023   Procedure: RIGHT ABOVE KNEE AMPUTATION;  Surgeon:  Timothy Ford, MD;  Location: [facility] OR;  Service: Orthopedics;  Laterality: Right;  REVISION RIGHT ABOVE KNEE AMPUTATION   AMPUTATION Left 06/24/2023   Procedure: AMPUTATION, ABOVE KNEE;  Surgeon: Timothy Ford, MD;  Location: [facility] OR;  Service: Orthopedics;  Laterality: Left;   AMPUTATION TOE Right 02/28/2022   Procedure: AMPUTATION TOE;  Surgeon: Jennefer Moats, DPM;  Location: [name] ORS;  Service: Podiatry;  Laterality: Right;  right fifth toe   ARTHROSCOPIC REPAIR ACL Left    AV FISTULA PLACEMENT Right 05/31/2019   Procedure:  Brachiocephalic AV fistula creation;  Surgeon: Celso College, MD;  Location: [name] ORS;  Service: Vascular;  Laterality: Right;   AV FISTULA PLACEMENT Left 08/13/2021   Procedure: ARTERIOVENOUS (AV) FISTULA CREATION (BRACHIALCEPHALIC);  Surgeon: Celso College, MD;  Location: [name] ORS;  Service: Vascular;  Laterality: Left;   CHEST TUBE INSERTION Right 05/06/2023   Procedure: CHEST TUBE INSERTION;  Surgeon: [name] Nigh, MD;  Location: [facility] ENDOSCOPY;  Service: Pulmonary;  Laterality: Right;   COLONOSCOPY WITH PROPOFOL  N/A 10/28/2015   Procedure: COLONOSCOPY WITH PROPOFOL ;  Surgeon: Deveron Fly, MD;  Location: [name] ENDOSCOPY;  Service: Endoscopy;  Laterality: N/A;   COLONOSCOPY WITH PROPOFOL  N/A 10/29/2015   Procedure: COLONOSCOPY WITH PROPOFOL ;  Surgeon: Deveron Fly, MD;  Location: [facility] ENDOSCOPY;  Service: Endoscopy;  Laterality: N/A;   COLONOSCOPY WITH PROPOFOL  N/A 01/27/2023   Procedure: COLONOSCOPY WITH PROPOFOL ;  Surgeon: Hargis Lias, MD;  Location: [facility] ENDO SUITE;  Service: Endoscopy;  Laterality: N/A;  12:30pm, asa 3   CORONARY ULTRASOUND/IVUS N/A 02/26/2020   Procedure: Intravascular Ultrasound/IVUS;  Surgeon: Lucendia Rusk, MD;  Location: [facility] INVASIVE CV LAB;  Service: Cardiovascular;  Laterality: N/A;   CORONARY/GRAFT ACUTE MI REVASCULARIZATION N/A 02/26/2020   Procedure: Coronary/Graft Acute MI Revascularization;  Surgeon: Lucendia Rusk, MD;   Location: [facility] INVASIVE CV LAB;  Service: Cardiovascular;  Laterality: N/A;   DIALYSIS/PERMA CATHETER INSERTION Right 04/26/2019   Perm Cath    DIALYSIS/PERMA CATHETER INSERTION N/A 04/26/2019   Procedure: DIALYSIS/PERMA CATHETER INSERTION;  Surgeon: Celso College, MD;  Location: [name] INVASIVE CV LAB;  Service: Cardiovascular;  Laterality: N/A;   DIALYSIS/PERMA CATHETER INSERTION N/A 05/25/2021   Procedure: DIALYSIS/PERMA CATHETER INSERTION;  Surgeon: Celso College, MD;  Location: [name] INVASIVE CV LAB;  Service: Cardiovascular;  Laterality: N/A;   DIALYSIS/PERMA CATHETER INSERTION N/A 08/31/2021   Procedure: DIALYSIS/PERMA CATHETER INSERTION;  Surgeon: Celso College, MD;  Location: [name] INVASIVE CV LAB;  Service: Cardiovascular;  Laterality: N/A;   DIALYSIS/PERMA CATHETER REMOVAL N/A 08/15/2019   Procedure: DIALYSIS/PERMA CATHETER REMOVAL;  Surgeon: Jackquelyn Mass, MD;  Location: [name] INVASIVE CV LAB;  Service: Cardiovascular;  Laterality: N/A;   DIALYSIS/PERMA CATHETER REMOVAL N/A 03/15/2022   Procedure: DIALYSIS/PERMA CATHETER REMOVAL;  Surgeon: Celso College, MD;  Location: [name] INVASIVE CV LAB;  Service: Cardiovascular;  Laterality: N/A;   EMBOLIZATION (CATH LAB) Right 02/06/2021   Procedure: EMBOLIZATION;  Surgeon: Celso College, MD;  Location: [name] INVASIVE CV LAB;  Service: Cardiovascular;  Laterality: Right;  Right Upper Extremity Dialysis Access, Permcath Placement   ESOPHAGOGASTRODUODENOSCOPY (EGD) WITH PROPOFOL  N/A 12/27/2017   Procedure: ESOPHAGOGASTRODUODENOSCOPY (EGD) WITH PROPOFOL ;  Surgeon: Toledo, Alphonsus Jeans, MD;  Location: [name] ENDOSCOPY;  Service: Gastroenterology;  Laterality: N/A;   EYE SURGERY     IR THORACENTESIS ASP PLEURAL SPACE W/IMG GUIDE  05/05/2023   LEFT HEART CATH AND CORONARY ANGIOGRAPHY N/A 02/26/2020   Procedure: LEFT HEART CATH AND CORONARY ANGIOGRAPHY;  Surgeon: Lucendia Rusk, MD;  Location: [facility] INVASIVE CV LAB;  Service: Cardiovascular;  Laterality: N/A;    PROSTATE SURGERY  2016   REVISON OF ARTERIOVENOUS FISTULA Right 07/22/2022   Procedure: RESECTION OF ANEURYSMAL RIGHT ARM ARTERIOVENOUS FISTULA;  Surgeon: Celso College, MD;  Location: [name] ORS;  Service: Vascular;  Laterality: Right;   TONSILECTOMY/ADENOIDECTOMY WITH MYRINGOTOMY     TONSILLECTOMY     UPPER EXTREMITY ANGIOGRAPHY Right 11/26/2020   Procedure: Upper Extremity Angiography;  Surgeon: Celso College, MD;  Location: [facility] INVASIVE  CV LAB;  Service: Cardiovascular;  Laterality: Right;   UPPER EXTREMITY ANGIOGRAPHY Right 02/05/2021   Procedure: UPPER EXTREMITY ANGIOGRAPHY;  Surgeon: Celso College, MD;  Location: [name] INVASIVE CV LAB;  Service: Cardiovascular;  Laterality: Right;   Social History   Occupational History   Occupation: Disability    Comment: not employed  Tobacco Use   Smoking status: Never   Smokeless tobacco: Never  Vaping Use   Vaping status: Never Used  Substance and Sexual Activity   Alcohol  use: No   Drug use: No   Sexual activity: Yes

## 2023-06-29 NOTE — Progress Notes (Signed)
 OT Cancellation Note  Patient Details Name: [name]. MRN: [id number] DOB: [birth year]   Cancelled Treatment:    Reason Eval/Treat Not Completed: (P) Patient at procedure or test/ unavailable, will return as able  Scherry Curtis 06/29/2023, 10:34 AM

## 2023-06-29 NOTE — Progress Notes (Signed)
 Occupational Therapy Treatment Patient Details Name: [name]. MRN: [id number] DOB: Jul 17, 1970 Today's Date: 06/29/2023   History of present illness Pt is a 53 yr old male presenting 4/30 from PCP with hypoglycemia and general weakness. Pt also with limited PO intake prior to admission. 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 Additionally, pt noted to be + for Cdiff.  PMHx: ESRD on HD TTS, R AKA (Dr. Julio Ohm 2/25), CVA, atrial fibrillation not on anticoagulation, DM2, HTN, and seizure disorder.   OT comments  [name] pain to LLE, motivated to participate and perform bed mobility today. Pt assisted to EOB, max A x2 for sidelying to sit, requires max A external support to maintain sitting balance. Pt able to roll to R side with mod A using rails. Pt able to sit EOB for a few minutes, tolerated well. Pt educated on BUE exercises with use of theraband, grip ball for grip strength. Pt has poor FM skills, able to eat handheld foods, not able to use utensils. Pt would benefit from continued acute OT to maximize participation and functional strength with ADLs. DC to postacute rehab <3hrs/day still appropriate.       If plan is discharge home, recommend the following:  Two people to help with walking and/or transfers;A lot of help with bathing/dressing/bathroom;Assistance with cooking/housework;Assist for transportation;Help with stairs or ramp for entrance   Equipment Recommendations  Other (comment) (defer)    Recommendations for Other Services      Precautions / Restrictions Precautions Precautions: Fall Restrictions Weight Bearing Restrictions Per Provider Order: Yes LLE Weight Bearing Per Provider Order: Non weight bearing       Mobility Bed Mobility Overal bed mobility: Needs Assistance Bed Mobility: Rolling, Supine to Sit, Sit to Supine Rolling: Mod assist, Used rails   Supine to sit: Max assist, +2 for physical assistance Sit to supine: Max assist   General  bed mobility comments: max A to assist to EOB and back, mod A for rolling to R side    Transfers Overall transfer level: Needs assistance                 General transfer comment: did not attempt     Balance Overall balance assessment: Needs assistance Sitting-balance support: Bilateral upper extremity supported Sitting balance-Leahy Scale: Poor Sitting balance - Comments: reliant on therapist support, max A to maintain sitting balance                                   ADL either performed or assessed with clinical judgement   ADL Overall ADL's : Needs assistance/impaired Eating/Feeding: Minimal assistance;Bed level                           Toileting- Clothing Manipulation and Hygiene: Maximal assistance;+2 for physical assistance         General ADL Comments: Pt max A x2 for assist to sitting EOB, max A to maintain sitting position EOB. Pt requires increased verbal cues and increased time to follow commands, history of L side stroke.    Extremity/Trunk Assessment Upper Extremity Assessment Upper Extremity Assessment: RUE deficits/detail;LUE deficits/detail RUE Deficits / Details: 4th digit amputation, difficulty with FM skills, needs assist with feeding. RUE Coordination: decreased fine motor LUE Deficits / Details: 3rd digit DIP amputation, limited shoulder flexion in supine LUE Coordination: decreased fine motor  Lower Extremity Assessment Lower Extremity Assessment: Defer to PT evaluation        Vision       Perception     Praxis     Communication Communication Communication: No apparent difficulties   Cognition Arousal: Alert Behavior During Therapy: WFL for tasks assessed/performed Cognition: No apparent impairments             OT - Cognition Comments: pleasant and conversational,                 Following commands: Impaired Following commands impaired: Follows one step commands with increased time       Cueing   Cueing Techniques: Verbal cues  Exercises Exercises: Other exercises Other Exercises Other Exercises: general BUE theraband exercises, Blue/green theraband tied to upper bed rails.    Shoulder Instructions       General Comments      Pertinent Vitals/ Pain       Pain Assessment Pain Assessment: 0-10 Pain Score: 7  Pain Location: L LE Pain Descriptors / Indicators: Discomfort, Sore Pain Intervention(s): Monitored during session  Home Living                                          Prior Functioning/Environment              Frequency  Min 1X/week        Progress Toward Goals  OT Goals(current goals can now be found in the care plan section)  Progress towards OT goals: Progressing toward goals  Acute Rehab OT Goals Patient Stated Goal: improve strength, manage pain OT Goal Formulation: With patient Time For Goal Achievement: 07/07/23 Potential to Achieve Goals: Good ADL Goals Pt Will Perform Eating: with supervision;with adaptive utensils;sitting Pt Will Perform Grooming: with set-up;sitting Pt Will Perform Lower Body Bathing: with set-up;sitting/lateral leans Pt Will Perform Upper Body Dressing: with min assist;sitting Pt Will Perform Lower Body Dressing: bed level;with min assist Pt Will Transfer to Toilet: with +2 assist;with mod assist;bedside commode Pt/caregiver will Perform Home Exercise Program: Increased ROM;Increased strength;Both right and left upper extremity;With Supervision;With written HEP provided Additional ADL Goal #1: Pt will be able to tolerate at least 1 hour of sitting in recliner to maximize participation in ADLs and OOB activities.  Plan      Co-evaluation                 AM-PAC OT "6 Clicks" Daily Activity     Outcome Measure   Help from another person eating meals?: A Little Help from another person taking care of personal grooming?: A Little Help from another person toileting, which includes  using toliet, bedpan, or urinal?: A Lot Help from another person bathing (including washing, rinsing, drying)?: A Lot Help from another person to put on and taking off regular upper body clothing?: A Lot Help from another person to put on and taking off regular lower body clothing?: A Lot 6 Click Score: 14    End of Session    OT Visit Diagnosis: Unsteadiness on feet (R26.81);Muscle weakness (generalized) (M62.81)   Activity Tolerance Patient limited by pain   Patient Left in bed;with call bell/phone within reach   Nurse Communication Mobility status        Time: 1610-9604 OT Time Calculation (min): 28 min  Charges: OT General Charges $OT Visit: 1 Visit OT Treatments $Therapeutic Activity: 8-22 mins $  Therapeutic Exercise: 8-22 mins  [facility], OTR/L   Scherry Curtis 06/29/2023, 3:44 PM

## 2023-06-29 NOTE — Progress Notes (Signed)
 Received patient in bed to unit.  Alert and oriented.  Informed consent signed and in chart.   TX duration: 3 hours  Patient tolerated well.  Transported back to the room  Alert, without acute distress.  Hand-off given to patient's nurse.   Access used: Right AV Graft Access issues: none  Total UF removed: 0mL Medication(s) given: Dilaudid    06/29/23 1145  Vitals  Temp 98.8 F (37.1 C)  Temp Source Axillary  BP (!) 165/72  Pulse Rate 67  Resp 11  Oxygen Therapy  SpO2 99 %  O2 Device Room Air  During Treatment Monitoring  HD Safety Checks Performed Yes  Intra-Hemodialysis Comments Tx completed  Dialysis Fluid Bolus Normal Saline  Bolus Amount (mL) 300 mL  Post Treatment  Dialyzer Clearance Clear  Liters Processed 72  Fluid Removed (mL) 0 mL  Tolerated HD Treatment Yes  AVG/AVF Arterial Site Held (minutes) 7 minutes  AVG/AVF Venous Site Held (minutes) 7 minutes  Fistula / Graft Left Upper arm  No placement date or time found.   Orientation: Left  Access Location: Upper arm  Status Deaccessed     Luciano Ruths LPN [facility]

## 2023-06-29 NOTE — Progress Notes (Signed)
 [facility] Progress Note   Subjective:    Seen and examined patient at bedside on HD.  C/o leg pain, receiving IV pain meds at time of exam.  Poor po intake.Appears he was ran even. He denies SOB, CP, and N/V.   Objective Vitals:   06/29/23 0742 06/29/23 0819 06/29/23 0823 06/29/23 0834  BP: (!) 172/79  (!) 166/70 (!) 144/75  Pulse: 71  71 69  Resp:   13 13  Temp: 98.3 F (36.8 C)  99.1 F (37.3 C)   TempSrc: Oral  Axillary   SpO2: 98%  99% 98%  Weight:  69.9 kg    Height:       Physical Exam General: Alert, oriented, NAD; on RA Heart: RRR, no murmurs, rubs or gallops Lungs: CTA bilaterally, respirations unlabored Abdomen: Soft, non-distended, +BS Extremities: L stump w/wound vac, no edema RLE Dialysis Access: LUE AVF + t/b  Filed Weights   06/22/23 1150 06/24/23 1225 06/29/23 0819  Weight: 72.8 kg 79.4 kg 69.9 kg    Intake/Output Summary (Last 24 hours) at 06/29/2023 0851 Last data filed at 06/29/2023 0600 Gross per 24 hour  Intake 1091.33 ml  Output 0 ml  Net 1091.33 ml    Additional Objective Labs: Basic Metabolic Panel: Recent Labs  Lab 06/25/23 0733 06/26/23 0721 06/28/23 0826  NA 146* 144 138  K 4.4 4.6 3.8  CL 110 114* 104  CO2 18* 15* 21*  GLUCOSE 97 104* 120*  BUN 69* 83* 58*  CREATININE 11.76* 13.09* 8.63*  CALCIUM  9.5 9.0 8.9  PHOS 9.4*  --   --    Liver Function Tests: Recent Labs  Lab 06/25/23 0733 06/26/23 0721 06/28/23 0826  AST 17 11* 15  ALT 11 12 11   ALKPHOS 55 54 52  BILITOT 0.6 0.6 0.8  PROT 5.6* 5.6* 5.8*  ALBUMIN  2.4* 2.3* 2.3*   No results for input(s): "LIPASE", "AMYLASE" in the last 168 hours. CBC: Recent Labs  Lab 06/23/23 0526 06/24/23 1305 06/25/23 0726 06/26/23 0721 06/28/23 0826  WBC 7.9  --  14.4* 14.3* 10.7*  HGB 7.9*   < > 7.0* 7.0* 6.8*  HCT 25.8*   < > 22.9* 23.5* 21.8*  MCV 70.7*  --  71.8* 74.6* 71.7*  PLT 113*  --  127* 141* 174   < > = values in this interval not displayed.    Blood Culture    Component Value Date/Time   SDES BLOOD RIGHT ARM 06/25/2023 0911   SPECREQUEST  06/25/2023 0911    BOTTLES DRAWN AEROBIC AND ANAEROBIC Blood Culture adequate volume   CULT  06/25/2023 0911    NO GROWTH 4 DAYS Performed at [facility], [address] [place], [place] [address]    REPTSTATUS PENDING 06/25/2023 6045    Cardiac Enzymes: No results for input(s): "CKTOTAL", "CKMB", "CKMBINDEX", "TROPONINI" in the last 168 hours. CBG: Recent Labs  Lab 06/27/23 2341 06/28/23 0519 06/28/23 0738 06/28/23 1156 06/29/23 0008  GLUCAP 97 77 77 95 115*   Iron Studies: No results for input(s): "IRON", "TIBC", "TRANSFERRIN", "FERRITIN" in the last 72 hours. Lab Results  Component Value Date   INR 1.2 05/06/2023   INR 1.2 05/05/2023   INR 1.1 03/01/2022   Studies/Results: No results found.  Medications:  sodium chloride  10 mL/hr at 06/24/23 2003    acidophilus  2 capsule Oral TID   amiodarone   200 mg Oral Daily   vitamin C   1,000 mg Oral Daily  aspirin  EC  81 mg Oral Q breakfast   atorvastatin   80 mg Oral QHS   calcium  acetate  1,334 mg Oral TID WC   carvedilol   25 mg Oral BID   Chlorhexidine  Gluconate Cloth  6 each Topical Q0600   darbepoetin (ARANESP ) injection - NON-DIALYSIS  100 mcg Subcutaneous Q Thu-1800   feeding supplement (NEPRO CARB STEADY)  237 mL Oral TID BM   heparin   5,000 Units Subcutaneous Q8H   levETIRAcetam   500 mg Oral Once per day on Sunday Tuesday Thursday Saturday   levETIRAcetam   750 mg Oral Once per day on Monday Wednesday Friday   liver oil-zinc  oxide   Topical BID   multivitamin  1 tablet Oral QHS   nutrition supplement (JUVEN)  1 packet Oral BID BM   pregabalin  50 mg Oral Daily   sodium chloride  flush  3 mL Intravenous Q12H   vancomycin   125 mg Oral QID   zinc  sulfate (50mg  elemental zinc )  220 mg Oral Daily    Dialysis Orders: [facility] TTS 3 hr  edw 77kg,  LUE AVF   Heparin  1000 bolus +  400u/hr Mircera 100 mcg  q 2wks due  4/28 ( was no show)  No vitamin D   Assessment/Plan: Infected wound L foot: hx of R AKA for similar issues. S/p L AKA  Failure to thrive: per pmd. ESRD: on HD TTS.  Off schedule this week, last HD Wednesday and did not have HD 5/2 due to surgery, refused HD 5/2 due to diarrhea. Received HD Mon, today; will resume usual schedule likely Sat if labs allow. HTN: Bp soft post op but now seems to be improving. Noted occasional spikes.  Anemia of esrd: Hgb now 6.8 and 1 unit PRBCs transfused, awaiting recheck. Continue to transfuse if Hgb < 7. Secondary hyperparathyroidism: CCa in range, phosphorus elevated. Continue phosphorus binder with meals.  Nutrition: optimize as able Seizure d/o - on keppra  DM2 - per pmd  [name] Alba MD [facility] Pager [phone]

## 2023-06-29 NOTE — Progress Notes (Signed)
 Patient refused CBG check at 0600.

## 2023-06-29 NOTE — Plan of Care (Signed)
   Problem: Health Behavior/Discharge Planning: Goal: Ability to manage health-related needs will improve Outcome: Progressing   Problem: Clinical Measurements: Goal: Ability to maintain clinical measurements within normal limits will improve Outcome: Progressing

## 2023-06-30 DIAGNOSIS — R627 Adult failure to thrive: Secondary | ICD-10-CM | POA: Diagnosis not present

## 2023-06-30 LAB — CBC
HCT: 22.8 % — ABNORMAL LOW (ref 39.0–52.0)
Hemoglobin: 7.2 g/dL — ABNORMAL LOW (ref 13.0–17.0)
MCH: 23.8 pg — ABNORMAL LOW (ref 26.0–34.0)
MCHC: 31.6 g/dL (ref 30.0–36.0)
MCV: 75.2 fL — ABNORMAL LOW (ref 80.0–100.0)
Platelets: 194 10*3/uL (ref 150–400)
RBC: 3.03 MIL/uL — ABNORMAL LOW (ref 4.22–5.81)
RDW: 24 % — ABNORMAL HIGH (ref 11.5–15.5)
WBC: 10.2 10*3/uL (ref 4.0–10.5)
nRBC: 0 % (ref 0.0–0.2)

## 2023-06-30 LAB — COMPREHENSIVE METABOLIC PANEL WITH GFR
ALT: 31 U/L (ref 0–44)
AST: 32 U/L (ref 15–41)
Albumin: 2.1 g/dL — ABNORMAL LOW (ref 3.5–5.0)
Alkaline Phosphatase: 52 U/L (ref 38–126)
Anion gap: 13 (ref 5–15)
BUN: 58 mg/dL — ABNORMAL HIGH (ref 6–20)
CO2: 25 mmol/L (ref 22–32)
Calcium: 8.9 mg/dL (ref 8.9–10.3)
Chloride: 100 mmol/L (ref 98–111)
Creatinine, Ser: 7.24 mg/dL — ABNORMAL HIGH (ref 0.61–1.24)
GFR, Estimated: 8 mL/min — ABNORMAL LOW (ref >60)
Glucose, Bld: 87 mg/dL (ref 70–99)
Potassium: 4.5 mmol/L (ref 3.5–5.1)
Sodium: 138 mmol/L (ref 135–145)
Total Bilirubin: 0.7 mg/dL (ref 0.0–1.2)
Total Protein: 5.7 g/dL — ABNORMAL LOW (ref 6.5–8.1)

## 2023-06-30 LAB — GLUCOSE, CAPILLARY
Glucose-Capillary: 69 mg/dL — ABNORMAL LOW (ref 70–99)
Glucose-Capillary: 130 mg/dL — ABNORMAL HIGH (ref 70–99)
Glucose-Capillary: 116 mg/dL — ABNORMAL HIGH (ref 70–99)

## 2023-06-30 LAB — CULTURE, BLOOD (ROUTINE X 2)
Culture: NO GROWTH
Special Requests: ADEQUATE
Special Requests: ADEQUATE

## 2023-06-30 LAB — MAGNESIUM: Magnesium: 2.1 mg/dL (ref 1.7–2.4)

## 2023-06-30 LAB — CORTISOL: Cortisol, Plasma: 11.1 ug/dL

## 2023-06-30 LAB — PHOSPHORUS: Phosphorus: 5.4 mg/dL — ABNORMAL HIGH (ref 2.5–4.6)

## 2023-06-30 MED ORDER — DEXTROSE 50 % IV SOLN
12.5000 g | INTRAVENOUS | Status: AC
  Administered 2023-06-30: 12.5 g via INTRAVENOUS

## 2023-06-30 MED ORDER — DEXTROSE 50 % IV SOLN
INTRAVENOUS | Status: AC
  Filled 2023-06-30: qty 50

## 2023-06-30 NOTE — Progress Notes (Signed)
 PROGRESS NOTE  [name]. 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  He was found to have an infected ischemic wound of the left foot, eventually underwent left AKA on 5/2  Subjective / 24h Interval events: Tells me he does not "feel well".  Denies any pain and cannot detail this further  Assesement and Plan: Active Problems:   C. difficile colitis   Syncope   Hypoglycemia   Ischemic ulcer of left heel (HCC)   Failure to thrive in adult   Gangrene of left foot (HCC)   Malnutrition of moderate degree  Principal problem Syncopal episode, hypoglycemia -slumped over and unconscious in PCPs office.  This is likely due to poor p.o. intake - Encourage p.o. intake, her low CBG again this morning at 60s. - Obtain cortisol to screen for adrenal insufficiency.  TSH fairly unremarkable just a month ago at 5.0  Active problems Infected ischemic ulcer of the left heel - History of gangrenous wounds on the right and has ultimately had a right AKA.  On admission he was found to have an infected ulcer of the left heel, orthopedic surgery consulted, he is now status post left AKA on 5/2.  Initially he was maintained on broad-spectrum antibiotics, now discontinued followed by amputation.  Wound VAC in place  Diarrhea -positive for C. difficile and norovirus on admission.  Continue p.o. vancomycin , diarrhea now improving.  Required rectal tube initially, now removed on 5/4 - He tells me his stools are becoming more formed  Failure to thrive - Progressive weakness now wheelchair-bound and using a [name]; despite recent SNF stay.  diminished oral intake only taking 1 meal per day. - Nutrition consulted - PT/OT consulted, recs for SNF, placement pending   Troponin elevation - No history of chest pain. High - sensitivity troponin low level elevated and flat 28 -> 30 which is consistent with demand ischemia, also underlying ESRD.  - Continue aspirin , statin, Coreg    Sacral wound - WOC consulted per above   ESRD,Tu/Th/Sat HD -Nephroogy consulted for HD   CAD history STEMI / Hx CVA - Continue home aspirin , atorvastatin  80 mg,  Coreg  12.5 mg twice daily   HFpEF -  Without acute exacerbation.  Not on diuretics   Paroxysmal A-fib off anticoagulation - currently in sinus rhythm, not on anticoagulation.  Continue home amiodarone , coreg  per above.    Hx IPH - Noted, off AC.    Seizure disorder - Continue home Keppra  500 mg nondialysis days, 750 mg on HD days.   DM type II diet controlled - Recent hypoglycemia, not on diabetic medications.  Every 6hr CBG checks, hypoglycemia protocol    Hypertension - continue home antihypertensives per above  Chronic anemia - Hx anemia CKD, received 2 units of packed red blood cells.  Hemoglobin 7.2 and has remained stable today   Enucleation OS, hx DM retinopathy, glaucoma - Noted, ophtho f/u OP.   Scheduled Meds:  acidophilus  2 capsule Oral TID   amiodarone   200 mg Oral Daily   vitamin C   1,000 mg Oral Daily   aspirin  EC  81 mg Oral Q breakfast  atorvastatin   80 mg Oral QHS   calcium  acetate  1,334 mg Oral TID WC   carvedilol   25 mg Oral BID   Chlorhexidine  Gluconate Cloth  6 each Topical Q0600   darbepoetin (ARANESP ) injection - NON-DIALYSIS  100 mcg Subcutaneous Q Thu-1800   dextrose        feeding supplement (NEPRO CARB STEADY)  237 mL Oral TID BM   heparin   5,000 Units Subcutaneous Q8H   levETIRAcetam   500 mg Oral Once per day on Sunday Tuesday Thursday Saturday   levETIRAcetam  750 mg Oral Once per day on Monday Wednesday Friday   liver oil-zinc oxide   Topical BID    multivitamin  1 tablet Oral QHS   nutrition supplement (JUVEN)  1 packet Oral BID BM   pregabalin  50 mg Oral Daily   sodium chloride flush  3 mL Intravenous Q12H   vancomycin  125 mg Oral QID   zinc sulfate (50mg elemental zinc)  220 mg Oral Daily   Continuous Infusions:  sodium chloride 10 mL/hr at 06/24/23 2003   PRN Meds:.acetaminophen, dextrose, hydrALAZINE, HYDROmorphone (DILAUDID) injection, melatonin, ondansetron (ZOFRAN) IV, polyethylene glycol  Current Outpatient Medications  Medication Instructions   acetaminophen (TYLENOL) 650 mg, Oral, Every 6 hours PRN   Amino Acids-Protein Hydrolys (PRO-STAT AWC) LIQD 30 mLs, Every morning   amiodarone (PACERONE) 200 mg, Oral, Daily   ammonium lactate (AMLACTIN DAILY) 12 % lotion Apply topically.   ascorbic acid (VITAMIN C) 1,000 mg, Daily   aspirin EC 81 mg, Oral, Daily with breakfast, Swallow whole.   atorvastatin (LIPITOR) 80 mg, Oral, Daily at bedtime   brimonidine (ALPHAGAN) 0.2 % ophthalmic solution 1 drop, Right Eye, 3 times daily   calcitRIOL (ROCALTROL) 2.25 mcg, Oral, Every T-Th-Sa (1800)   calcium acetate (PHOSLO) 1,334 mg, 3 times daily   carvedilol (COREG) 12.5 mg, Oral, 2 times daily   cinacalcet (SENSIPAR) 60 mg, Oral, Every T-Th-Sa (1800)   dorzolamide-timolol (COSOPT) 2-0.5 % ophthalmic solution 1 drop, 2 times daily   levETIRAcetam (KEPPRA) 500 mg, Oral, 4 times weekly, On Sunday, Monday, Wednesday and Friday, all non-dialysis days   levETIRAcetam (KEPPRA) 750 mg, Oral, 3 times weekly, On Tuesday Thursday and Saturday, on dialysis days   lidocaine-prilocaine (EMLA) cream 1 Application, 3 times weekly   loperamide (IMODIUM) 2 mg, Oral, As needed   multivitamin (RENA-VIT) TABS tablet 1 tablet, Oral, Daily at bedtime   Nutritional Supplements (,FEEDING SUPPLEMENT, PROSOURCE PLUS) liquid 30 mLs, Oral, 3 times daily between meals   ondansetron (ZOFRAN) 4 mg, Oral, Every 6 hours PRN   oxyCODONE (OXY IR/ROXICODONE) 5 mg,  Oral, Every 4 hours PRN   polyethylene glycol (MIRALAX / GLYCOLAX) 17 g, Oral, Daily   Zinc Oxide (DESITIN MAXIMUM STRENGTH) 40 % PSTE 1 Application, Apply externally, 2 times daily PRN    Diet Orders (From admission, onward)     Start     Ordered   06/24/23 1938  Diet renal with fluid restriction Fluid restriction: 1200 mL Fluid; Room service appropriate? Yes; Fluid consistency: Thin  Diet effective now       Question Answer Comment  Fluid restriction: 1200 mL Fluid   Room service appropriate? Yes   Fluid consistency: Thin      05 /02/25 1937            DVT prophylaxis: heparin  injection 5,000 Units Start: 06/21/23 0600   Lab Results  Component Value Date   PLT 194 06/30/2023      Code  Status: Limited: Do not attempt resuscitation (DNR) -DNR-LIMITED -Do Not Intubate/DNI   Family Communication: no family at bedside   Status is: Inpatient Remains inpatient appropriate because: placement   Level of care: Telemetry Medical  Consultants:  Nephrology   Objective: Vitals:   06/29/23 1939 06/30/23 0450 06/30/23 0900 06/30/23 0913  BP: (!) 103/56 132/61 (!) 140/67 (!) 140/67  Pulse: 73 68 66 66  Resp: 17 18 20    Temp: 98.9 F (37.2 C) (!) 100.4 F (38 C) 98.6 F (37 C) 98.6 F (37 C)  TempSrc:  Oral Oral Oral  SpO2: 96% 100% 100% 100%  Weight:      Height:        Intake/Output Summary (Last 24 hours) at 06/30/2023 0944 Last data filed at 06/29/2023 1145 Gross per 24 hour  Intake --  Output 0 ml  Net 0 ml   Wt Readings from Last 3 Encounters:  06/29/23 69.9 kg  06/16/23 74.4 kg  05/30/23 74.4 kg    Examination:  Constitutional: NAD Eyes: lids and conjunctivae normal, no scleral icterus ENMT: mmm Neck: normal, supple Respiratory: clear to auscultation bilaterally, no wheezing, no crackles. Normal respiratory effort.  Cardiovascular: Regular rate and rhythm, no murmurs / rubs / gallops. No LE edema. Abdomen: soft, no distention, no tenderness. Bowel  sounds positive.   Data Reviewed: I have independently reviewed following labs and imaging studies   CBC Recent Labs  Lab 06/25/23 0726 06/26/23 0721 06/28/23 0826 06/29/23 0829 06/30/23 0545  WBC 14.4* 14.3* 10.7* 11.4* 10.2  HGB 7.0* 7.0* 6.8* 7.2* 7.2*  HCT 22.9* 23.5* 21.8* 23.3* 22.8*  PLT 127* 141* 174 188 194  MCV 71.8* 74.6* 71.7* 74.9* 75.2*  MCH 21.9* 22.2* 22.4* 23.2* 23.8*  MCHC 30.6 29.8* 31.2 30.9 31.6  RDW 24.7* 24.4* 24.4* 23.9* 24.0*    Recent Labs  Lab 06/25/23 0733 06/26/23 0721 06/28/23 0826 06/29/23 0829 06/30/23 0545  NA 146* 144 138 141 138  K 4.4 4.6 3.8 4.5 4.5  CL 110 114* 104 107 100  CO2 18* 15* 21* 21* 25  GLUCOSE 97 104* 120* 80 87  BUN 69* 83* 58* 81* 58*  CREATININE 11.76* 13.09* 8.63* 10.31* 7.24*  CALCIUM  9.5 9.0 8.9 9.1 8.9  AST 17 11* 15 43* 32  ALT 11 12 11 30 31   ALKPHOS 55 54 52 54 52  BILITOT 0.6 0.6 0.8 0.8 0.7  ALBUMIN  2.4* 2.3* 2.3* 2.1* 2.1*  MG  --   --   --   --  2.1    ------------------------------------------------------------------------------------------------------------------ No results for input(s): "CHOL", "HDL", "LDLCALC", "TRIG", "CHOLHDL", "LDLDIRECT" in the last 72 hours.  Lab Results  Component Value Date   HGBA1C 5.0 05/05/2023   ------------------------------------------------------------------------------------------------------------------ No results for input(s): "TSH", "T4TOTAL", "T3FREE", "THYROIDAB" in the last 72 hours.  Invalid input(s): "FREET3"  Cardiac Enzymes No results for input(s): "CKMB", "TROPONINI", "MYOGLOBIN" in the last 168 hours.  Invalid input(s): "CK" ------------------------------------------------------------------------------------------------------------------    Component Value Date/Time   BNP 1,416.0 (H) 11/21/2021 1117    CBG: Recent Labs  Lab 06/28/23 0519 06/28/23 0738 06/28/23 1156 06/29/23 0008 06/30/23 0622  GLUCAP 77 77 95 115* 69*    Recent  Results (from the past 240 hours)  Gastrointestinal Panel by PCR , Stool     Status: Abnormal   Collection Time: 06/21/23  3:56 AM   Specimen: Stool  Result Value Ref Range Status   Campylobacter species NOT DETECTED NOT DETECTED Final   Plesimonas shigelloides  NOT DETECTED NOT DETECTED Final   Salmonella species NOT DETECTED NOT DETECTED Final   Yersinia enterocolitica NOT DETECTED NOT DETECTED Final   Vibrio species NOT DETECTED NOT DETECTED Final   Vibrio cholerae NOT DETECTED NOT DETECTED Final   Enteroaggregative E coli (EAEC) NOT DETECTED NOT DETECTED Final   Enteropathogenic E coli (EPEC) NOT DETECTED NOT DETECTED Final   Enterotoxigenic E coli (ETEC) NOT DETECTED NOT DETECTED Final   Shiga like toxin producing E coli (STEC) NOT DETECTED NOT DETECTED Final   Shigella/Enteroinvasive E coli (EIEC) NOT DETECTED NOT DETECTED Final   Cryptosporidium NOT DETECTED NOT DETECTED Final   Cyclospora cayetanensis NOT DETECTED NOT DETECTED Final   Entamoeba histolytica NOT DETECTED NOT DETECTED Final   Giardia lamblia NOT DETECTED NOT DETECTED Final   Adenovirus F40/41 NOT DETECTED NOT DETECTED Final   Astrovirus NOT DETECTED NOT DETECTED Final   Norovirus GI/GII DETECTED (A) NOT DETECTED Final    Comment: RESULT CALLED TO, READ BACK BY AND VERIFIED WITH: TRAVIS ROSS RN @0229  06/22/23 ASW    Rotavirus A NOT DETECTED NOT DETECTED Final   Sapovirus (I, II, IV, and V) NOT DETECTED NOT DETECTED Final    Comment: Performed at [facility], [address] [place], [place] [address]  C Difficile Quick Screen w PCR reflex     Status: Abnormal   Collection Time: 06/21/23  3:56 AM   Specimen: STOOL  Result Value Ref Range Status   C Diff antigen POSITIVE (A) NEGATIVE Final   C Diff toxin NEGATIVE NEGATIVE Final   C Diff interpretation Results are indeterminate. See PCR results.  Final    Comment: Performed at [facility], [address] [place], [place] [address]  C. Diff by PCR,  Reflexed     Status: Abnormal   Collection Time: 06/21/23  3:56 AM  Result Value Ref Range Status   Toxigenic C. Difficile by PCR POSITIVE (A) NEGATIVE Final    Comment: Positive for toxigenic C. difficile with little to no toxin production. Only treat if clinical presentation suggests symptomatic illness. Performed at [facility], [address] [place], [place] [address]   Surgical pcr screen     Status: None   Collection Time: 06/24/23 12:03 PM   Specimen: Nasal Mucosa; Nasal Swab  Result Value Ref Range Status   MRSA, PCR NEGATIVE NEGATIVE Final   Staphylococcus aureus NEGATIVE NEGATIVE Final    Comment: (NOTE) The Xpert SA Assay (FDA approved for NASAL specimens in patients 61 years of age and older), is one component of a comprehensive surveillance program. It is not intended to diagnose infection nor to guide or monitor treatment. Performed at [facility], [address], [place], [place] [address]   Culture, blood (Routine X 2) w Reflex to ID Panel     Status: None (Preliminary result)   Collection Time: 06/25/23  9:10 AM   Specimen: BLOOD RIGHT ARM  Result Value Ref Range Status   Specimen Description BLOOD RIGHT ARM  Final   Special Requests   Final    BOTTLES DRAWN AEROBIC AND ANAEROBIC Blood Culture adequate volume   Culture   Final    NO GROWTH 4 DAYS Performed at [facility], [address] [place], [place] [address]    Report Status PENDING  Incomplete  Culture, blood (Routine X 2) w Reflex to ID Panel     Status: None (Preliminary result)   Collection Time: 06/25/23  9:11 AM  Specimen: BLOOD RIGHT ARM  Result Value Ref Range Status   Specimen Description BLOOD RIGHT ARM  Final   Special Requests   Final    BOTTLES DRAWN AEROBIC AND ANAEROBIC Blood Culture adequate volume   Culture   Final    NO GROWTH 4 DAYS Performed at [facility], [address], [place], [place] [address]    Report Status PENDING  Incomplete      Radiology Studies: No results found.   Kathlen Para, MD, PhD [facility]  Between 7 am - 7 pm I am available, please contact me via Amion (for emergencies) or Securechat (non urgent messages)  Between 7 pm - 7 am I am not available, please contact night coverage MD/APP via Amion

## 2023-06-30 NOTE — Progress Notes (Signed)
 Inpatient Rehab Admissions Coordinator:   Per therapy recommendations pt was screened for CIR by Loye Rumble, PT, DPT. Maximove today on eval, not up for OOB.  I will rescreen for progress next therapy session to assess candidacy.   Loye Rumble, PT, DPT Admissions Coordinator [phone] 06/30/23  4:09 PM

## 2023-06-30 NOTE — Progress Notes (Signed)
 [facility] Progress Note   Subjective:    Seen and examined patient at bedside. He reports stomach upset. Ran even yesterday and he denied any issues while on treatment. He was on HD off schedule this week. Plan for next HD on Saturday if labs remain stable. Will check labs in th AM.  Objective Vitals:   06/29/23 1939 06/30/23 0450 06/30/23 0900 06/30/23 0913  BP: (!) 103/56 132/61 (!) 140/67 (!) 140/67  Pulse: 73 68 66 66  Resp: 17 18 20    Temp: 98.9 F (37.2 C) (!) 100.4 F (38 C) 98.6 F (37 C) 98.6 F (37 C)  TempSrc:  Oral Oral Oral  SpO2: 96% 100% 100% 100%  Weight:      Height:       Physical Exam General: Alert, oriented, NAD; on RA Heart: RRR, no murmurs, rubs or gallops Lungs: CTA bilaterally, respirations unlabored Abdomen: Soft, non-distended, +BS Extremities: L stump w/wound vac, no edema RLE Dialysis Access: LUE AVF + t/b  Filed Weights   06/24/23 1225 06/29/23 0819 06/29/23 1215  Weight: 79.4 kg 69.9 kg 69.9 kg   No intake or output data in the 24 hours ending 06/30/23 1213  Additional Objective Labs: Basic Metabolic Panel: Recent Labs  Lab 06/25/23 0733 06/26/23 0721 06/28/23 0826 06/29/23 0829 06/30/23 0545  NA 146*   < > 138 141 138  K 4.4   < > 3.8 4.5 4.5  CL 110   < > 104 107 100  CO2 18*   < > 21* 21* 25  GLUCOSE 97   < > 120* 80 87  BUN 69*   < > 58* 81* 58*  CREATININE 11.76*   < > 8.63* 10.31* 7.24*  CALCIUM  9.5   < > 8.9 9.1 8.9  PHOS 9.4*  --   --   --  5.4*   < > = values in this interval not displayed.   Liver Function Tests: Recent Labs  Lab 06/28/23 0826 06/29/23 0829 06/30/23 0545  AST 15 43* 32  ALT 11 30 31   ALKPHOS 52 54 52  BILITOT 0.8 0.8 0.7  PROT 5.8* 5.7* 5.7*  ALBUMIN  2.3* 2.1* 2.1*   No results for input(s): "LIPASE", "AMYLASE" in the last 168 hours. CBC: Recent Labs  Lab 06/25/23 0726 06/26/23 0721 06/28/23 0826 06/29/23 0829 06/30/23 0545  WBC 14.4* 14.3* 10.7* 11.4* 10.2  HGB  7.0* 7.0* 6.8* 7.2* 7.2*  HCT 22.9* 23.5* 21.8* 23.3* 22.8*  MCV 71.8* 74.6* 71.7* 74.9* 75.2*  PLT 127* 141* 174 188 194   Blood Culture    Component Value Date/Time   SDES BLOOD RIGHT ARM 06/25/2023 0911   SPECREQUEST  06/25/2023 0911    BOTTLES DRAWN AEROBIC AND ANAEROBIC Blood Culture adequate volume   CULT  06/25/2023 0911    NO GROWTH 5 DAYS Performed at [facility], [address] [place], [place] [address]    REPTSTATUS 06/30/2023 FINAL 06/25/2023 0911    Cardiac Enzymes: No results for input(s): "CKTOTAL", "CKMB", "CKMBINDEX", "TROPONINI" in the last 168 hours. CBG: Recent Labs  Lab 06/28/23 0519 06/28/23 0738 06/28/23 1156 06/29/23 0008 06/30/23 0622  GLUCAP 77 77 95 115* 69*   Iron Studies: No results for input(s): "IRON", "TIBC", "TRANSFERRIN", "FERRITIN" in the last 72 hours. Lab Results  Component Value Date   INR 1.2 05/06/2023   INR 1.2 05/05/2023   INR 1.1 03/01/2022   Studies/Results: No results found.  Medications:  sodium chloride  10  mL/hr at 06/24/23 2003    acidophilus  2 capsule Oral TID   amiodarone   200 mg Oral Daily   vitamin C   1,000 mg Oral Daily   aspirin  EC  81 mg Oral Q breakfast   atorvastatin   80 mg Oral QHS   calcium  acetate  1,334 mg Oral TID WC   carvedilol   25 mg Oral BID   Chlorhexidine  Gluconate Cloth  6 each Topical Q0600   darbepoetin (ARANESP ) injection - NON-DIALYSIS  100 mcg Subcutaneous Q Thu-1800   dextrose        feeding supplement (NEPRO CARB STEADY)  237 mL Oral TID BM   heparin   5,000 Units Subcutaneous Q8H   levETIRAcetam   500 mg Oral Once per day on Sunday Tuesday Thursday Saturday   levETIRAcetam   750 mg Oral Once per day on Monday Wednesday Friday   liver oil-zinc  oxide   Topical BID   multivitamin  1 tablet Oral QHS   nutrition supplement (JUVEN)  1 packet Oral BID BM   pregabalin  50 mg Oral Daily   sodium chloride  flush  3 mL Intravenous Q12H   vancomycin   125 mg Oral QID   zinc  sulfate  (50mg  elemental zinc )  220 mg Oral Daily    Dialysis Orders: [facility] TTS 3 hr  edw 77kg,  LUE AVF   Heparin  1000 bolus + 400u/hr Mircera 100 mcg  q 2wks due  4/28 ( was no show)  No vitamin D   Assessment/Plan: Infected wound L foot: hx of R AKA for similar issues. S/p L AKA  Failure to thrive: per pmd. ESRD: on HD TTS.  Off schedule this week. Did not have HD 5/2 due to surgery, refused HD 5/3 due to diarrhea. Received HD Mon and yesterday. Plan for next HD on 5/10 to place him back on his routine schedule if labs remain stable-checking them in the AM.  HTN/Vol: Bp soft post op but now seems to be improving. Noted occasional spikes. Remains euvolemic on exam and on RA. Anemia of esrd: S/p 1 unit PRBCs 5/6 for Hgb 6.8. Hgb now is 7.2. Continue to transfuse if Hgb < 7. Secondary hyperparathyroidism: CCa in range, phosphorus elevated. Continue phosphorus binder with meals.  Nutrition: optimize as able Seizure d/o - on keppra  DM2 - per pmd  [name], NP [facility] 06/30/2023,12:13 PM  LOS: 9 days

## 2023-06-30 NOTE — Progress Notes (Signed)
 Nutrition Follow-up  DOCUMENTATION CODES:   Non-severe (moderate) malnutrition in context of chronic illness  INTERVENTION:  Continue renal MVI w/ minerals Continue Nepro Shake po TID, each supplement provides 425 kcal and 19 grams protein  Liberalize diet to regular, fluid restriction Continue probiotic for gut health/bowel regularity Continue 1 packet Juven BID, each packet provides 95 calories, 2.5 grams of protein (collagen), and 9.8 grams of carbohydrate (3 grams sugar); also contains 7 grams of L-arginine and L-glutamine, 300 mg vitamin C , 15 mg vitamin E, 1.2 mcg vitamin B-12, 9.5 mg zinc , 200 mg calcium , and 1.5 g  Calcium  Beta-hydroxy-Beta-methylbutyrate to support wound healing  Continue zinc  and vitamin C  for wound healing support  NUTRITION DIAGNOSIS:  Moderate Malnutrition related to chronic illness (ESRD-HD, hx R AKA, T2DM, CAD, HTN) as evidenced by mild fat depletion, moderate muscle depletion, percent weight loss. - remains applicable  GOAL:  Patient will meet greater than or equal to 90% of their needs - progressing  MONITOR:  PO intake, Supplement acceptance, Skin, I & O's  REASON FOR ASSESSMENT:  Consult Assessment of nutrition requirement/status  ASSESSMENT:  53 yo male admitted with FTT, infected ischemic wound of the L foot, syncope, hypoglycemia. PMH includes PAD, chronic ischemic L heel ulcer, R AKA, ESRD on HD TTS, CAD, CVA, HF, PAF, seizure D/O, DM-2, HTN, HLD, wheelchair bound, chronic diarrhea, gastroparesis, BPH.  4/29 admitted 5/2 L AKA, wound vac 5/4 rectal tube removed   Patient with no significant complaints today. Does report not feeling well, but cannot endorse how he feels bad or why. Continues with c. diff, however stools are becoming more formed. Auths pending for dispo/placement.    Average Meal Intake 5/5: 0-100% x3 documented meals 56: 100% x2 documented meals 5/8: 100% x2 documented meals   Intake improving. Was changed back to  renal diet restriction inadvertently s/p surgery. Nephrology previously changed to regular, per patient request and AEB his lab trends. Will change back to promote intake. Still with some low blood sugars. Being screened for adrenal insufficiency. No diabetes medications currently ordered.   Admit Weight: 75kg Current Weight: 69.9kg   He is anuric at baseline. Net even during dialysis yesterday. Tolerated well.    Drains/Lines: LUE AVF: + bruit/thrill Wound vac (placed 5/2)   Probiotics continue.  Appears euvolemic. Corr Ca within range. Calcium -based binder continues. Reiterated importance of phosphorus binders with meals.   Meds: acidophilus, ascorbic acid , calcium  acetate, darbepoetin alfa , renal MVI, Juven, ABX, zinc    Labs: Na+ 137 (wdl) K+ 4.8 (wdl) PHOS 8.5 (H) CBGs 67-88 x24 hours  Diet Order:   Diet Order             Diet renal with fluid restriction Fluid restriction: 1200 mL Fluid; Room service appropriate? Yes; Fluid consistency: Thin  Diet effective now            EDUCATION NEEDS:  Education needs have been addressed  Skin:  Skin Assessment: Skin Integrity Issues: Skin Integrity Issues:: Other (Comment) Other: infected ischemic wound L foot  Last BM:  5/1 - type 7 x2  Height:  Ht Readings from Last 1 Encounters:  06/24/23 6\' 3"  (1.905 m)   Weight:  Wt Readings from Last 1 Encounters:  06/29/23 69.9 kg   Ideal Body Weight:  82 kg  BMI:  Body mass index is 19.26 kg/m.  Estimated Nutritional Needs:   Kcal:  2300-2500  Protein:  110-125 gm  Fluid:  1 L + UOP  Con Decant MS,  RD, LDN Registered Dietitian Clinical Nutrition RD Inpatient Contact Info in Amion

## 2023-06-30 NOTE — Evaluation (Signed)
 Occupational Therapy Evaluation Patient Details Name: [name]. MRN: [id number] DOB: September 22, 1970 Today's Date: 06/30/2023   History of Present Illness   Pt is a 53 yr old male presenting 4/30 from PCP with hypoglycemia and general weakness. Pt also with limited PO intake prior to admission. 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 Additionally, pt noted to be + for Cdiff.  PMHx: ESRD on HD TTS, R AKA (Dr. Julio Ohm 2/25), CVA, atrial fibrillation not on anticoagulation, DM2, HTN, and seizure disorder.     Clinical Impressions [name] pain to LLE, but eager to participate and improve strength and participation, tolerated long session well. Pt with large BM in bed, able to roll L/R with mod A using hand rails, able to hold position for several minutes, lift BLEs up or out as needed to assist with positioning. Maximove sling positioned under Pt, Pt able to tolerated several minutes held up in sling without complaints, does hurt LLE when initiating lift off and returning back to bed. After much rolling L/R and time in sling Pt returned to bed, all needs met. Pt has therabands tied to B hand rails, eager to improve strength, asks questions about spending time in recliner and purpose, educated on importance of OOB activity and exercise to maintain and improve muscle mass. Changing Recommendation to postacute intensive rehab >3hrs/day as Pt very motivated to improve in all areas, tolerates therapy well. Will continue to see acutely to progress as able.      If plan is discharge home, recommend the following:   Two people to help with walking and/or transfers;A lot of help with bathing/dressing/bathroom;Assistance with cooking/housework;Assist for transportation;Help with stairs or ramp for entrance     Functional Status Assessment         Equipment Recommendations   Other (comment) (defer)     Recommendations for Other Services   Rehab consult      Precautions/Restrictions   Precautions Precautions: Fall Recall of Precautions/Restrictions: Intact Precaution/Restrictions Comments: bilateral AKA, L LE wound vac Restrictions Weight Bearing Restrictions Per Provider Order: Yes LLE Weight Bearing Per Provider Order: Non weight bearing     Mobility Bed Mobility Overal bed mobility: Needs Assistance Bed Mobility: Rolling, Supine to Sit Rolling: Mod assist, Used rails, +2 for physical assistance   Supine to sit: +2 for physical assistance, Mod assist, Used rails     General bed mobility comments: pt rolled many times, able to initiate with LE and reaching with UE and complete partial roll with mod A but mod A +2 needed to achieve full SL position. Pt also came to long sitting with use of B rails and mod A +2 from pad behind him    Transfers Overall transfer level: Needs assistance Equipment used: Ambulation equipment used               General transfer comment: pt tolerated placement of lift pad and lifting up off bed for linen change, after numerous times rolling and repositioning he was not up to sitting in chair but did tolerate >4 mins in sling in prep for getting to chair in future visits Transfer via Lift Equipment: Maximove    Balance Overall balance assessment: Needs assistance Sitting-balance support: Bilateral upper extremity supported Sitting balance-Leahy Scale: Poor Sitting balance - Comments: in long sitting       Standing balance comment: unable  ADL either performed or assessed with clinical judgement   ADL Overall ADL's : Needs assistance/impaired                             Toileting- Clothing Manipulation and Hygiene: Bed level;Total assistance         General ADL Comments: Pt bed level for toileting and hygiene, able to roll L/R with mod A.     Vision         Perception         Praxis         Pertinent Vitals/Pain Pain  Assessment Pain Assessment: Faces Faces Pain Scale: Hurts even more Pain Location: L LE and bottom Pain Descriptors / Indicators: Discomfort, Sore     Extremity/Trunk Assessment             Communication Communication Communication: No apparent difficulties   Cognition Arousal: Alert Behavior During Therapy: WFL for tasks assessed/performed                                 Following commands: Intact Following commands impaired: Follows one step commands with increased time     Cueing  General Comments   Cueing Techniques: Verbal cues  pt maintained hip flex and hip abduction bilaterally with assist throughout BM clean up. Dressing placed on pt's buttocks after cleanup. LLE dressing removed from residual limb due to being soiled   Exercises     Shoulder Instructions      Home Living                                          Prior Functioning/Environment                      OT Problem List:     OT Treatment/Interventions:        OT Goals(Current goals can be found in the care plan section)   Acute Rehab OT Goals Patient Stated Goal: to manage pain, improve strength OT Goal Formulation: With patient Time For Goal Achievement: 07/07/23 Potential to Achieve Goals: Good ADL Goals Pt Will Perform Eating: with supervision;with adaptive utensils;sitting Pt Will Perform Grooming: with set-up;sitting Pt Will Perform Lower Body Bathing: with set-up;sitting/lateral leans Pt Will Perform Upper Body Dressing: with min assist;sitting Pt Will Perform Lower Body Dressing: bed level;with min assist Pt Will Transfer to Toilet: with +2 assist;with mod assist;bedside commode Pt/caregiver will Perform Home Exercise Program: Increased ROM;Increased strength;Both right and left upper extremity;With Supervision;With written HEP provided Additional ADL Goal #1: Pt will be able to tolerate at least 1 hour of sitting in recliner to maximize  participation in ADLs and OOB activities.   OT Frequency:  Min 2X/week    Co-evaluation PT/OT/SLP Co-Evaluation/Treatment: Yes Reason for Co-Treatment: For patient/therapist safety;To address functional/ADL transfers   OT goals addressed during session: ADL's and self-care;Strengthening/ROM      AM-PAC OT "6 Clicks" Daily Activity     Outcome Measure Help from another person eating meals?: A Little Help from another person taking care of personal grooming?: A Little Help from another person toileting, which includes using toliet, bedpan, or urinal?: A Lot Help from another person bathing (including washing, rinsing, drying)?: A Lot Help from another person to put on and taking  off regular upper body clothing?: A Lot Help from another person to put on and taking off regular lower body clothing?: A Lot 6 Click Score: 14   End of Session Nurse Communication: Mobility status;Need for lift equipment  Activity Tolerance: Patient tolerated treatment well Patient left: in bed;with call bell/phone within reach  OT Visit Diagnosis: Unsteadiness on feet (R26.81);Muscle weakness (generalized) (M62.81)                Time: 8841-6606 OT Time Calculation (min): 55 min Charges:  OT General Charges $OT Visit: 1 Visit OT Treatments $Self Care/Home Management : 23-37 mins  [place], OTR/L   Scherry Curtis 06/30/2023, 3:19 PM

## 2023-06-30 NOTE — Progress Notes (Signed)
 Physical Therapy Treatment Patient Details Name: [name]. MRN: [id number] DOB: 1970/09/25 Today's Date: 06/30/2023   History of Present Illness Pt is a 53 yr old male presenting 4/30 from PCP with hypoglycemia and general weakness. Pt also with limited PO intake prior to admission. 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 Additionally, pt noted to be + for Cdiff.  PMHx: ESRD on HD TTS, R AKA (Dr. Julio Ohm 2/25), CVA, atrial fibrillation not on anticoagulation, DM2, HTN, and seizure disorder.    PT Comments  Pt seen by PT and OT to initiate getting OOB to chair, pt agreeable. Pt had large BM in bed, tolerated numerous bouts of rolling and worked on holding LE's in various positions while getting cleaned up. Pt able to initiate rolling and complete partial roll with mod A, needs +2 assist to achieve full SL position. Pt lifted above bed in maximove sling and maintained >4 mins suspended. Tolerated well but after so much repositioning and rolling he was not up for trying out recliner today. Pt motivated to continue progressing and tolerated therapy better today. Patient will benefit from intensive inpatient follow-up therapy, >3 hours/day. PT will continue to follow.     If plan is discharge home, recommend the following: Two people to help with walking and/or transfers;Help with stairs or ramp for entrance;Assist for transportation;Assistance with cooking/housework;Supervision due to cognitive status   Can travel by private vehicle     No  Equipment Recommendations  Other (comment) (TBD)    Recommendations for Other Services Rehab consult     Precautions / Restrictions Precautions Precautions: Fall Recall of Precautions/Restrictions: Intact Precaution/Restrictions Comments: bilateral AKA, L LE wound vac Restrictions Weight Bearing Restrictions Per Provider Order: Yes RLE Weight Bearing Per Provider Order: Non weight bearing LLE Weight Bearing Per Provider Order: Non  weight bearing     Mobility  Bed Mobility Overal bed mobility: Needs Assistance Bed Mobility: Rolling, Supine to Sit Rolling: Mod assist, Used rails, +2 for physical assistance   Supine to sit: +2 for physical assistance, Mod assist, Used rails     General bed mobility comments: pt rolled many times, able to initiate with LE and reaching with UE and complete partial roll with mod A but mod A +2 needed to achieve full SL position. Pt also came to long sitting with use of B rails and mod A +2 from pad behind him    Transfers Overall transfer level: Needs assistance Equipment used: Ambulation equipment used               General transfer comment: pt tolerated placement of lift pad and lifting up off bed for linen change, after numerous times rolling and repositioning he was not up to sitting in chair but did tolerate >4 mins in sling in prep for getting to chair in future visits Transfer via Lift Equipment: Maximove  Ambulation/Gait               General Gait Details: unable   Stairs             Wheelchair Mobility     Tilt Bed    Modified Rankin (Stroke Patients Only)       Balance Overall balance assessment: Needs assistance Sitting-balance support: Bilateral upper extremity supported Sitting balance-Leahy Scale: Poor Sitting balance - Comments: in long sitting       Standing balance comment: unable  Communication Communication Communication: No apparent difficulties  Cognition Arousal: Alert Behavior During Therapy: WFL for tasks assessed/performed   PT - Cognitive impairments: No family/caregiver present to determine baseline                       PT - Cognition Comments: pt can be resistant to mobility at times Following commands: Intact Following commands impaired: Follows one step commands with increased time    Cueing Cueing Techniques: Verbal cues  Exercises      General Comments  General comments (skin integrity, edema, etc.): pt maintained hip flex and hip abduction bilaterally with assist throughout BM clean up. Dressing placed on pt's buttocks after cleanup. LLE dressing removed from residual limb due to being soiled      Pertinent Vitals/Pain Pain Assessment Pain Assessment: Faces Faces Pain Scale: Hurts even more Pain Location: L LE and bottom Pain Descriptors / Indicators: Discomfort, Sore Pain Intervention(s): Limited activity within patient's tolerance, Monitored during session, Repositioned    Home Living                          Prior Function            PT Goals (current goals can now be found in the care plan section) Acute Rehab PT Goals Patient Stated Goal: Improve strength, independence and hopefully walk with prosthetic PT Goal Formulation: With patient Time For Goal Achievement: 07/07/23 Potential to Achieve Goals: Fair Progress towards PT goals: Progressing toward goals    Frequency    Min 2X/week      PT Plan      Co-evaluation PT/OT/SLP Co-Evaluation/Treatment: Yes Reason for Co-Treatment: For patient/therapist safety;To address functional/ADL transfers   OT goals addressed during session: ADL's and self-care;Strengthening/ROM      AM-PAC PT "6 Clicks" Mobility   Outcome Measure  Help needed turning from your back to your side while in a flat bed without using bedrails?: A Lot Help needed moving from lying on your back to sitting on the side of a flat bed without using bedrails?: A Lot Help needed moving to and from a bed to a chair (including a wheelchair)?: Total Help needed standing up from a chair using your arms (e.g., wheelchair or bedside chair)?: Total Help needed to walk in hospital room?: Total Help needed climbing 3-5 steps with a railing? : Total 6 Click Score: 8    End of Session Equipment Utilized During Treatment: Other (comment) (lift pad) Activity Tolerance: Patient tolerated treatment  well Patient left: in bed;with call bell/phone within reach;with bed alarm set Nurse Communication: Mobility status PT Visit Diagnosis: Other abnormalities of gait and mobility (R26.89);Pain;Muscle weakness (generalized) (M62.81);Adult, failure to thrive (R62.7) Pain - Right/Left: Left Pain - part of body: Leg     Time: 8295-6213 PT Time Calculation (min) (ACUTE ONLY): 40 min  Charges:    $Therapeutic Activity: 8-22 mins PT General Charges $$ ACUTE PT VISIT: 1 Visit                     Amey Ka, PT  Acute Rehab Services Secure chat preferred Office [phone]    Deloris Fetters Jandy Brackens 06/30/2023, 2:35 PM

## 2023-06-30 NOTE — Progress Notes (Addendum)
 RN notified Aldona Amel MD and Marijean Shouts, Charge RN of pts refusal to allow staff to obtain 6pm CBG (blood glucose-fingerstick). Pt educated on importance of glucose monitoring in those with diabetes, especially since his tends to run low.   Per physical therapy, pt wound vac dressing became soiled during incontinence episode. RN notified Steffanie Edouard, PA who informed RN "It was due to come off tomorrow anyways so just remove it".  RN requested assistance with wound-vac removal due to dressing adhering to pt skin, pt yelling out in pain, and RN not wanting to compromise surgical incision. Bambi Lever, PA informed. PA stated "You'll need to page whoever is on for [facility]". Charge RN spoke with answering service Roxanna Coppersmith) who is paging covering MD. Nightshift RN made aware and will be taking over this pts care.

## 2023-06-30 NOTE — TOC Progression Note (Addendum)
 Transition of Care (TOC) - Progression Note    Patient Details  Name: [name]. MRN: [id number] Date of Birth: 06-25-1970  Transition of Care [facility]) CM/SW Contact  Jannice Mends, LCSW Phone Number: 06/30/2023, 9:13 AM  Clinical Narrative:    9:14 AM-CSW spoke with [facility] to confirm bed offer. She is checking ability to accept patient with C diff versus need for private room and will confirm date with CSW.   CSW spoke with patient's spouse who reported agreement with plan if insurance will approve.   Per notes, wound vac to be removed at DC.   10:28 AM-[facility] able to accept patient. CSW initiated insurance process, Ref# [id number].  2:25 PM-CSW received Peer to Peer request from insurance, deadline is tm at 9:30am, [phone] option 5 (ID# [id number]). Info given to MD and he will attempt.   Expected Discharge Plan: Skilled Nursing Facility Barriers to Discharge: Continued Medical Work up, English as a second language teacher, SNF Pending bed offer  Expected Discharge Plan and Services In-house Referral: Clinical Social Work   Post Acute Care Choice: Skilled Nursing Facility Living arrangements for the past 2 months: Single Family Home                                       Social Determinants of Health (SDOH) Interventions SDOH Screenings   Food Insecurity: No Food Insecurity (06/21/2023)  Housing: High Risk (05/22/2023)  Transportation Needs: No Transportation Needs (05/22/2023)  Utilities: Patient Declined (06/21/2023)  Financial Resource Strain: High Risk (07/05/2017)  Physical Activity: Inactive (07/05/2017)  Social Connections: Socially Integrated (05/05/2023)  Stress: Stress Concern Present (07/05/2017)  Tobacco Use: Low Risk  (06/29/2023)  Health Literacy: Low Risk  (06/01/2020)   Received from [facility]    Readmission Risk Interventions    06/25/2023    9:22 AM 06/21/2023   10:51 AM 05/17/2023    3:44 PM  Readmission Risk Prevention Plan   Transportation Screening Complete Complete Complete  Medication Review (RN Care Manager) Complete Complete Referral to Pharmacy  PCP or Specialist appointment within 3-5 days of discharge Complete  Complete  HRI or Home Care Consult Complete Complete Complete  SW Recovery Care/Counseling Consult Complete Complete Complete  Palliative Care Screening Not Applicable Not Applicable Not Applicable  Skilled Nursing Facility Complete Not Applicable Not Applicable

## 2023-07-01 DIAGNOSIS — R627 Adult failure to thrive: Secondary | ICD-10-CM | POA: Diagnosis not present

## 2023-07-01 LAB — MAGNESIUM: Magnesium: 2.3 mg/dL (ref 1.7–2.4)

## 2023-07-01 LAB — CBC WITH DIFFERENTIAL/PLATELET
Abs Immature Granulocytes: 0.06 10*3/uL (ref 0.00–0.07)
Basophils Absolute: 0.1 10*3/uL (ref 0.0–0.1)
Basophils Relative: 1 %
Eosinophils Absolute: 1.1 10*3/uL — ABNORMAL HIGH (ref 0.0–0.5)
Eosinophils Relative: 10 %
HCT: 23.8 % — ABNORMAL LOW (ref 39.0–52.0)
Hemoglobin: 7.4 g/dL — ABNORMAL LOW (ref 13.0–17.0)
Immature Granulocytes: 1 %
Lymphocytes Relative: 14 %
Lymphs Abs: 1.4 10*3/uL (ref 0.7–4.0)
MCH: 23.1 pg — ABNORMAL LOW (ref 26.0–34.0)
MCHC: 31.1 g/dL (ref 30.0–36.0)
MCV: 74.4 fL — ABNORMAL LOW (ref 80.0–100.0)
Monocytes Absolute: 1.2 10*3/uL — ABNORMAL HIGH (ref 0.1–1.0)
Monocytes Relative: 12 %
Neutro Abs: 6.6 10*3/uL (ref 1.7–7.7)
Neutrophils Relative %: 62 %
Platelets: 220 10*3/uL (ref 150–400)
RBC: 3.2 MIL/uL — ABNORMAL LOW (ref 4.22–5.81)
RDW: 24 % — ABNORMAL HIGH (ref 11.5–15.5)
WBC: 10.3 10*3/uL (ref 4.0–10.5)
nRBC: 0 % (ref 0.0–0.2)

## 2023-07-01 LAB — COMPREHENSIVE METABOLIC PANEL WITH GFR
ALT: 63 U/L — ABNORMAL HIGH (ref 0–44)
AST: 70 U/L — ABNORMAL HIGH (ref 15–41)
Albumin: 2.1 g/dL — ABNORMAL LOW (ref 3.5–5.0)
Alkaline Phosphatase: 66 U/L (ref 38–126)
Anion gap: 13 (ref 5–15)
BUN: 85 mg/dL — ABNORMAL HIGH (ref 6–20)
CO2: 24 mmol/L (ref 22–32)
Calcium: 9.1 mg/dL (ref 8.9–10.3)
Chloride: 102 mmol/L (ref 98–111)
Creatinine, Ser: 8.96 mg/dL — ABNORMAL HIGH (ref 0.61–1.24)
GFR, Estimated: 6 mL/min — ABNORMAL LOW (ref >60)
Glucose, Bld: 107 mg/dL — ABNORMAL HIGH (ref 70–99)
Potassium: 4.9 mmol/L (ref 3.5–5.1)
Sodium: 139 mmol/L (ref 135–145)
Total Bilirubin: 0.6 mg/dL (ref 0.0–1.2)
Total Protein: 5.9 g/dL — ABNORMAL LOW (ref 6.5–8.1)

## 2023-07-01 LAB — PHOSPHORUS: Phosphorus: 6.1 mg/dL — ABNORMAL HIGH (ref 2.5–4.6)

## 2023-07-01 LAB — GLUCOSE, CAPILLARY: Glucose-Capillary: 115 mg/dL — ABNORMAL HIGH (ref 70–99)

## 2023-07-01 MED ORDER — CHLORHEXIDINE GLUCONATE CLOTH 2 % EX PADS
6.0000 | MEDICATED_PAD | Freq: Every day | CUTANEOUS | Status: DC
  Administered 2023-07-01 – 2023-07-03 (×2): 6 via TOPICAL

## 2023-07-01 NOTE — Progress Notes (Signed)
 PROGRESS NOTE  [name]. 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  He was found to have an infected ischemic wound of the left foot, eventually underwent left AKA on 5/2  Subjective / 24h Interval events: He is asking for assistance to eat, no other complaints  Assesement and Plan: Active Problems:   C. difficile colitis   Syncope   Hypoglycemia   Ischemic ulcer of left heel (HCC)   Failure to thrive in adult   Gangrene of left foot (HCC)   Malnutrition of moderate degree  Principal problem Syncopal episode, hypoglycemia -slumped over and unconscious in PCPs office.  This is likely due to poor p.o. intake - Encourage p.o. intake, no hypoglycemia since yesterday morning - Cortisol adequate at 11.1 at around 10 AM, no adrenal insufficiency.  TSH unremarkable  Active problems Infected ischemic ulcer of the left heel - History of gangrenous wounds on the right and has ultimately had a right AKA.  On admission he was found to have an infected ulcer of the left heel, orthopedic surgery consulted, he is now status post left AKA on 5/2.  Initially he was maintained on broad-spectrum antibiotics, now discontinued followed by amputation.  Wound VAC removed 5/8  Diarrhea -positive for C. difficile and norovirus on admission.  Continue p.o. vancomycin , diarrhea now improving.  Required rectal tube initially, now removed on 5/4 - Improving  Failure to thrive - Progressive weakness now wheelchair-bound and using a Hoyer; despite recent SNF stay. diminished oral intake only taking 1 meal per day. - Nutrition  consulted - PT/OT consulted, recs for SNF, placement pending, unfortunately insurance denied   Troponin elevation - No history of chest pain. High - sensitivity troponin low level elevated and flat 28 -> 30 which is consistent with demand ischemia, also underlying ESRD.  - Continue aspirin , statin, Coreg    Sacral wound - WOC consulted per above   ESRD,Tu/Th/Sat HD -Nephroogy consulted for HD   CAD history STEMI / Hx CVA - Continue home aspirin , atorvastatin  80 mg,  Coreg  12.5 mg twice daily   HFpEF -  Without acute exacerbation.  Not on diuretics   Paroxysmal A-fib off anticoagulation - currently in sinus rhythm, not on anticoagulation.  Continue home amiodarone , coreg  per above.    Hx IPH - Noted, off AC.    Seizure disorder - Continue home Keppra  500 mg nondialysis days, 750 mg on HD days.   DM type II diet controlled - Recent hypoglycemia, not on diabetic medications.  Every 6hr CBG checks, hypoglycemia protocol    Hypertension - continue home antihypertensives, blood pressure stable and acceptable  Chronic anemia - Hx anemia CKD, received 2 units of packed red blood cells.  Hemoglobin 7.2 and has remained stable today   Enucleation OS, hx DM retinopathy, glaucoma - Noted, ophtho f/u OP.   Scheduled Meds:  acidophilus  2 capsule Oral TID   amiodarone   200 mg Oral Daily   vitamin C   1,000 mg Oral Daily   aspirin  EC  81 mg Oral Q breakfast   atorvastatin   80 mg Oral QHS   calcium  acetate  1,334 mg Oral  TID WC   carvedilol   25 mg Oral BID   Chlorhexidine  Gluconate Cloth  6 each Topical Q0600   darbepoetin (ARANESP ) injection - NON-DIALYSIS  100 mcg Subcutaneous Q Thu-1800   feeding supplement (NEPRO CARB STEADY)  237 mL Oral TID BM   heparin   5,000 Units Subcutaneous Q8H   levETIRAcetam   500 mg Oral Once per day on Sunday Tuesday Thursday Saturday   levETIRAcetam  750 mg Oral Once per day on Monday Wednesday Friday   liver oil-zinc oxide   Topical BID   multivitamin  1  tablet Oral QHS   nutrition supplement (JUVEN)  1 packet Oral BID BM   pregabalin  50 mg Oral Daily   sodium chloride flush  3 mL Intravenous Q12H   vancomycin  125 mg Oral QID   zinc sulfate (50mg elemental zinc)  220 mg Oral Daily   Continuous Infusions:  sodium chloride 10 mL/hr at 06/24/23 2003   PRN Meds:.acetaminophen, hydrALAZINE, HYDROmorphone (DILAUDID) injection, melatonin, ondansetron (ZOFRAN) IV, polyethylene glycol  Current Outpatient Medications  Medication Instructions   acetaminophen (TYLENOL) 650 mg, Oral, Every 6 hours PRN   Amino Acids-Protein Hydrolys (PRO-STAT AWC) LIQD 30 mLs, Every morning   amiodarone (PACERONE) 200 mg, Oral, Daily   ammonium lactate (AMLACTIN DAILY) 12 % lotion Apply topically.   ascorbic acid (VITAMIN C) 1,000 mg, Daily   aspirin EC 81 mg, Oral, Daily with breakfast, Swallow whole.   atorvastatin (LIPITOR) 80 mg, Oral, Daily at bedtime   brimonidine (ALPHAGAN) 0.2 % ophthalmic solution 1 drop, Right Eye, 3 times daily   calcitRIOL (ROCALTROL) 2.25 mcg, Oral, Every T-Th-Sa (1800)   calcium acetate (PHOSLO) 1,334 mg, 3 times daily   carvedilol (COREG) 12.5 mg, Oral, 2 times daily   cinacalcet (SENSIPAR) 60 mg, Oral, Every T-Th-Sa (1800)   dorzolamide-timolol (COSOPT) 2-0.5 % ophthalmic solution 1 drop, 2 times daily   levETIRAcetam (KEPPRA) 500 mg, Oral, 4 times weekly, On Sunday, Monday, Wednesday and Friday, all non-dialysis days   levETIRAcetam (KEPPRA) 750 mg, Oral, 3 times weekly, On Tuesday Thursday and Saturday, on dialysis days   lidocaine-prilocaine (EMLA) cream 1 Application, 3 times weekly   loperamide (IMODIUM) 2 mg, Oral, As needed   multivitamin (RENA-VIT) TABS tablet 1 tablet, Oral, Daily at bedtime   Nutritional Supplements (,FEEDING SUPPLEMENT, PROSOURCE PLUS) liquid 30 mLs, Oral, 3 times daily between meals   ondansetron (ZOFRAN) 4 mg, Oral, Every 6 hours PRN   oxyCODONE (OXY IR/ROXICODONE) 5 mg, Oral, Every 4 hours PRN    polyethylene glycol (MIRALAX / GLYCOLAX) 17 g, Oral, Daily   Zinc Oxide (DESITIN MAXIMUM STRENGTH) 40 % PSTE 1 Application, Apply externally, 2 times daily PRN    Diet Orders (From admission, onward)     Start     Ordered   06/30/23 1518  Diet regular Room service appropriate? Yes; Fluid consistency: Thin; Fluid restriction: 1200 mL Fluid  Diet effective now       Question Answer Comment  Room service appropriate? Yes   Fluid consistency: Thin   Fluid restriction: 1200 mL Fluid      05 /08/25 1518            DVT prophylaxis: heparin  injection 5,000 Units Start: 06/21/23 0600   Lab Results  Component Value Date   PLT 220 07/01/2023      Code Status: Limited: Do not attempt resuscitation (DNR) -DNR-LIMITED -Do Not Intubate/DNI   Family Communication: no family at bedside   Status is: Inpatient Remains  inpatient appropriate because: placement   Level of care: Telemetry Medical  Consultants:  Nephrology   Objective: Vitals:   06/30/23 1700 06/30/23 2020 07/01/23 0514 07/01/23 0839  BP: 119/61 102/65 138/61 (!) 130/52  Pulse: 67 76 (!) 50 72  Resp: 18 18 18 20   Temp: 99.2 F (37.3 C) 98.1 F (36.7 C) 98.7 F (37.1 C) 99.2 F (37.3 C)  TempSrc: Oral Oral Oral Oral  SpO2: 96% 98% 98% 92%  Weight:      Height:        Intake/Output Summary (Last 24 hours) at 07/01/2023 0958 Last data filed at 06/30/2023 2200 Gross per 24 hour  Intake 477 ml  Output --  Net 477 ml   Wt Readings from Last 3 Encounters:  06/29/23 69.9 kg  06/16/23 74.4 kg  05/30/23 74.4 kg    Examination:  Constitutional: NAD Eyes: lids and conjunctivae normal, no scleral icterus ENMT: mmm Neck: normal, supple Respiratory: clear to auscultation bilaterally, no wheezing, no crackles.  Cardiovascular: Regular rate and rhythm, no murmurs / rubs / gallops.  Abdomen: soft, no distention, no tenderness. Bowel sounds positive.   Data Reviewed: I have independently reviewed following labs and  imaging studies   CBC Recent Labs  Lab 06/26/23 0721 06/28/23 0826 06/29/23 0829 06/30/23 0545 07/01/23 0727  WBC 14.3* 10.7* 11.4* 10.2 10.3  HGB 7.0* 6.8* 7.2* 7.2* 7.4*  HCT 23.5* 21.8* 23.3* 22.8* 23.8*  PLT 141* 174 188 194 220  MCV 74.6* 71.7* 74.9* 75.2* 74.4*  MCH 22.2* 22.4* 23.2* 23.8* 23.1*  MCHC 29.8* 31.2 30.9 31.6 31.1  RDW 24.4* 24.4* 23.9* 24.0* 24.0*  LYMPHSABS  --   --   --   --  1.4  MONOABS  --   --   --   --  1.2*  EOSABS  --   --   --   --  1.1*  BASOSABS  --   --   --   --  0.1    Recent Labs  Lab 06/26/23 0721 06/28/23 0826 06/29/23 0829 06/30/23 0545 07/01/23 0727  NA 144 138 141 138 139  K 4.6 3.8 4.5 4.5 4.9  CL 114* 104 107 100 102  CO2 15* 21* 21* 25 24  GLUCOSE 104* 120* 80 87 107*  BUN 83* 58* 81* 58* 85*  CREATININE 13.09* 8.63* 10.31* 7.24* 8.96*  CALCIUM  9.0 8.9 9.1 8.9 9.1  AST 11* 15 43* 32 70*  ALT 12 11 30 31  63*  ALKPHOS 54 52 54 52 66  BILITOT 0.6 0.8 0.8 0.7 0.6  ALBUMIN  2.3* 2.3* 2.1* 2.1* 2.1*  MG  --   --   --  2.1 2.3    ------------------------------------------------------------------------------------------------------------------ No results for input(s): "CHOL", "HDL", "LDLCALC", "TRIG", "CHOLHDL", "LDLDIRECT" in the last 72 hours.  Lab Results  Component Value Date   HGBA1C 5.0 05/05/2023   ------------------------------------------------------------------------------------------------------------------ No results for input(s): "TSH", "T4TOTAL", "T3FREE", "THYROIDAB" in the last 72 hours.  Invalid input(s): "FREET3"  Cardiac Enzymes No results for input(s): "CKMB", "TROPONINI", "MYOGLOBIN" in the last 168 hours.  Invalid input(s): "CK" ------------------------------------------------------------------------------------------------------------------    Component Value Date/Time   BNP 1,416.0 (H) 11/21/2021 1117    CBG: Recent Labs  Lab 06/29/23 0008 06/30/23 0622 06/30/23 1226 06/30/23 2150  07/01/23 0519  GLUCAP 115* 69* 130* 116* 115*    Recent Results (from the past 240 hours)  Surgical pcr screen     Status: None   Collection Time: 06/24/23 12:03 PM  Specimen: Nasal Mucosa; Nasal Swab  Result Value Ref Range Status   MRSA, PCR NEGATIVE NEGATIVE Final   Staphylococcus aureus NEGATIVE NEGATIVE Final    Comment: (NOTE) The Xpert SA Assay (FDA approved for NASAL specimens in patients 25 years of age and older), is one component of a comprehensive surveillance program. It is not intended to diagnose infection nor to guide or monitor treatment. Performed at [facility], [address] [place], [place] [address]   Culture, blood (Routine X 2) w Reflex to ID Panel     Status: None   Collection Time: 06/25/23  9:10 AM   Specimen: BLOOD RIGHT ARM  Result Value Ref Range Status   Specimen Description BLOOD RIGHT ARM  Final   Special Requests   Final    BOTTLES DRAWN AEROBIC AND ANAEROBIC Blood Culture adequate volume   Culture   Final    NO GROWTH 5 DAYS Performed at [facility], [address], [place], [place] [address]    Report Status 06/30/2023 FINAL  Final  Culture, blood (Routine X 2) w Reflex to ID Panel     Status: None   Collection Time: 06/25/23  9:11 AM   Specimen: BLOOD RIGHT ARM  Result Value Ref Range Status   Specimen Description BLOOD RIGHT ARM  Final   Special Requests   Final    BOTTLES DRAWN AEROBIC AND ANAEROBIC Blood Culture adequate volume   Culture   Final    NO GROWTH 5 DAYS Performed at [facility], [address], [place], [place] [address]    Report Status 06/30/2023 FINAL  Final     Radiology Studies: No results found.   Kathlen Para, MD, PhD [facility]  Between 7 am - 7 pm I am available, please contact me via Amion (for emergencies) or Securechat (non urgent messages)  Between 7 pm - 7 am I am not available, please contact night coverage MD/APP via Amion

## 2023-07-01 NOTE — TOC Progression Note (Addendum)
 Transition of Care (TOC) - Progression Note    Patient Details  Name: [name]. MRN: [id number] Date of Birth: 1971-01-23  Transition of Care [facility]) CM/SW Contact  Jannice Mends, LCSW Phone Number: 07/01/2023, 10:00 AM  Clinical Narrative:    CSW completed Appeal with Shayne Demark, call ref # [phone]) for the denial of SNF authorization at [facility], Appeal # [id number]. Faxed clinicals to [phone].   Of note, if insurance decline SNF, they are unlikely to approved CIR.   4:07 PM: [employer] called to say they received the appeal request and have 72 hours to review the case.    Expected Discharge Plan: Skilled Nursing Facility Barriers to Discharge: Continued Medical Work up, English as a second language teacher, SNF Pending bed offer  Expected Discharge Plan and Services In-house Referral: Clinical Social Work   Post Acute Care Choice: Skilled Nursing Facility Living arrangements for the past 2 months: Single Family Home                                       Social Determinants of Health (SDOH) Interventions SDOH Screenings   Food Insecurity: No Food Insecurity (06/21/2023)  Housing: High Risk (05/22/2023)  Transportation Needs: No Transportation Needs (05/22/2023)  Utilities: Patient Declined (06/21/2023)  Financial Resource Strain: High Risk (07/05/2017)  Physical Activity: Inactive (07/05/2017)  Social Connections: Socially Integrated (05/05/2023)  Stress: Stress Concern Present (07/05/2017)  Tobacco Use: Low Risk  (06/29/2023)  Health Literacy: Low Risk  (06/01/2020)   [facility] from [facility], [facility]    Readmission Risk Interventions    06/25/2023    9:22 AM 06/21/2023   10:51 AM 05/17/2023    3:44 PM  Readmission Risk Prevention Plan  Transportation Screening Complete Complete Complete  Medication Review (RN Care Manager) Complete Complete Referral to Pharmacy  PCP or Specialist appointment within 3-5 days of discharge Complete  Complete   HRI or Home Care Consult Complete Complete Complete  SW Recovery Care/Counseling Consult Complete Complete Complete  Palliative Care Screening Not Applicable Not Applicable Not Applicable  Skilled Nursing Facility Complete Not Applicable Not Applicable

## 2023-07-01 NOTE — Progress Notes (Addendum)
 [facility] Progress Note   Subjective:    Seen and examined patient at bedside. No issues and feels fine. Current K+ 4.9. Plan for next HD tomorrow to get him back on usual TTS schedule.  Objective Vitals:   06/30/23 1700 06/30/23 2020 07/01/23 0514 07/01/23 0839  BP: 119/61 102/65 138/61 (!) 130/52  Pulse: 67 76 (!) 50 72  Resp: 18 18 18 20   Temp: 99.2 F (37.3 C) 98.1 F (36.7 C) 98.7 F (37.1 C) 99.2 F (37.3 C)  TempSrc: Oral Oral Oral Oral  SpO2: 96% 98% 98% 92%  Weight:      Height:       Physical Exam General: Alert, oriented, NAD; on RA Heart: RRR, no murmurs, rubs or gallops Lungs: CTA bilaterally, respirations unlabored Abdomen: Soft, non-distended, +BS Extremities: L stump w/wound vac, no edema RLE Dialysis Access: LUE AVF + t/b  Filed Weights   06/24/23 1225 06/29/23 0819 06/29/23 1215  Weight: 79.4 kg 69.9 kg 69.9 kg    Intake/Output Summary (Last 24 hours) at 07/01/2023 1034 Last data filed at 06/30/2023 2200 Gross per 24 hour  Intake 477 ml  Output --  Net 477 ml    Additional Objective Labs: Basic Metabolic Panel: Recent Labs  Lab 06/25/23 0733 06/26/23 0721 06/29/23 0829 06/30/23 0545 07/01/23 0727  NA 146*   < > 141 138 139  K 4.4   < > 4.5 4.5 4.9  CL 110   < > 107 100 102  CO2 18*   < > 21* 25 24  GLUCOSE 97   < > 80 87 107*  BUN 69*   < > 81* 58* 85*  CREATININE 11.76*   < > 10.31* 7.24* 8.96*  CALCIUM  9.5   < > 9.1 8.9 9.1  PHOS 9.4*  --   --  5.4* 6.1*   < > = values in this interval not displayed.   Liver Function Tests: Recent Labs  Lab 06/29/23 0829 06/30/23 0545 07/01/23 0727  AST 43* 32 70*  ALT 30 31 63*  ALKPHOS 54 52 66  BILITOT 0.8 0.7 0.6  PROT 5.7* 5.7* 5.9*  ALBUMIN  2.1* 2.1* 2.1*   No results for input(s): "LIPASE", "AMYLASE" in the last 168 hours. CBC: Recent Labs  Lab 06/26/23 0721 06/28/23 0826 06/29/23 0829 06/30/23 0545 07/01/23 0727  WBC 14.3* 10.7* 11.4* 10.2 10.3  NEUTROABS   --   --   --   --  6.6  HGB 7.0* 6.8* 7.2* 7.2* 7.4*  HCT 23.5* 21.8* 23.3* 22.8* 23.8*  MCV 74.6* 71.7* 74.9* 75.2* 74.4*  PLT 141* 174 188 194 220   Blood Culture    Component Value Date/Time   SDES BLOOD RIGHT ARM 06/25/2023 0911   SPECREQUEST  06/25/2023 0911    BOTTLES DRAWN AEROBIC AND ANAEROBIC Blood Culture adequate volume   CULT  06/25/2023 0911    NO GROWTH 5 DAYS Performed at [facility], [address], [place], [place] [address]    REPTSTATUS 06/30/2023 FINAL 06/25/2023 0911    Cardiac Enzymes: No results for input(s): "CKTOTAL", "CKMB", "CKMBINDEX", "TROPONINI" in the last 168 hours. CBG: Recent Labs  Lab 06/29/23 0008 06/30/23 0622 06/30/23 1226 06/30/23 2150 07/01/23 0519  GLUCAP 115* 69* 130* 116* 115*   Iron Studies: No results for input(s): "IRON", "TIBC", "TRANSFERRIN", "FERRITIN" in the last 72 hours. Lab Results  Component Value Date   INR 1.2 05/06/2023   INR 1.2 05/05/2023   INR 1.1  03/01/2022   Studies/Results: No results found.  Medications:  sodium chloride  10 mL/hr at 06/24/23 2003    acidophilus  2 capsule Oral TID   amiodarone   200 mg Oral Daily   vitamin C   1,000 mg Oral Daily   aspirin  EC  81 mg Oral Q breakfast   atorvastatin   80 mg Oral QHS   calcium  acetate  1,334 mg Oral TID WC   carvedilol   25 mg Oral BID   Chlorhexidine  Gluconate Cloth  6 each Topical Q0600   darbepoetin (ARANESP ) injection - NON-DIALYSIS  100 mcg Subcutaneous Q Thu-1800   feeding supplement (NEPRO CARB STEADY)  237 mL Oral TID BM   heparin   5,000 Units Subcutaneous Q8H   levETIRAcetam   500 mg Oral Once per day on Sunday Tuesday Thursday Saturday   levETIRAcetam   750 mg Oral Once per day on Monday Wednesday Friday   liver oil-zinc  oxide   Topical BID   multivitamin  1 tablet Oral QHS   nutrition supplement (JUVEN)  1 packet Oral BID BM   pregabalin   50 mg Oral Daily   sodium chloride  flush  3 mL Intravenous Q12H   vancomycin   125 mg Oral QID    zinc  sulfate (50mg  elemental zinc )  220 mg Oral Daily    Dialysis Orders: [facility] TTS 3 hr  edw 77kg,  LUE AVF   Heparin  1000 bolus + 400u/hr Mircera 100 mcg  q 2wks due  4/28 ( was no show)  No vitamin D   Assessment/Plan: Infected wound L foot: hx of R AKA for similar issues. S/p L AKA  Failure to thrive: per pmd. ESRD: on HD TTS.  Off schedule this week. Did not have HD 5/2 due to surgery, refused HD 5/3 due to diarrhea. Received HD Mon and Wed. Current k+ stable. Plan for next HD on 5/10 to place him back on his routine schedule and patient is in agreement. HTN/Vol: Bp soft post op but now seems to be improving. Noted occasional spikes. Remains euvolemic on exam and on RA. Now appears to be under EDW. Continue minimal/even UF Anemia of esrd: S/p 1 unit PRBCs 5/6 for Hgb 6.8. Hgb now is 7.4. Continue to transfuse if Hgb < 7. Secondary hyperparathyroidism: CCa in range, phosphorus elevated. Continue phosphorus binder with meals.  Nutrition: optimize as able Seizure d/o - on keppra  DM2 - per pmd  [name], NP [facility] 07/01/2023,10:34 AM  LOS: 10 days

## 2023-07-01 NOTE — Anesthesia Postprocedure Evaluation (Signed)
 Anesthesia Post Note  Patient: [name] Ransier Jr.  Procedure(s) Performed: AMPUTATION, ABOVE KNEE (Left: Knee)     Patient location during evaluation: PACU Anesthesia Type: General Level of consciousness: patient cooperative Pain management: pain level controlled Vital Signs Assessment: post-procedure vital signs reviewed and stable Respiratory status: spontaneous breathing, nonlabored ventilation, respiratory function stable and patient connected to nasal cannula oxygen Cardiovascular status: blood pressure returned to baseline and stable Postop Assessment: no apparent nausea or vomiting Anesthetic complications: no   No notable events documented.               Lashina Milles

## 2023-07-01 NOTE — TOC Progression Note (Signed)
 Transition of Care (TOC) - Progression Note    Patient Details  Name: [name]. MRN: [id number] Date of Birth: 1970/04/21  Transition of Care [facility]) CM/SW Contact  Carmon Christen, LCSWA Phone Number: 07/01/2023, 11:31 AM  Clinical Narrative:     Appeal pending for SNF. Recs now for CIR. TOC will continue to follow.   Expected Discharge Plan: Skilled Nursing Facility Barriers to Discharge: Continued Medical Work up, English as a second language teacher, SNF Pending bed offer  Expected Discharge Plan and Services In-house Referral: Clinical Social Work   Post Acute Care Choice: Skilled Nursing Facility Living arrangements for the past 2 months: Single Family Home                                       Social Determinants of Health (SDOH) Interventions SDOH Screenings   Food Insecurity: No Food Insecurity (06/21/2023)  Housing: High Risk (05/22/2023)  Transportation Needs: No Transportation Needs (05/22/2023)  Utilities: Patient Declined (06/21/2023)  Financial Resource Strain: High Risk (07/05/2017)  Physical Activity: Inactive (07/05/2017)  Social Connections: Socially Integrated (05/05/2023)  Stress: Stress Concern Present (07/05/2017)  Tobacco Use: Low Risk  (06/29/2023)  Health Literacy: Low Risk  (06/01/2020)   Received from [facility], [facility]    Readmission Risk Interventions    06/25/2023    9:22 AM 06/21/2023   10:51 AM 05/17/2023    3:44 PM  Readmission Risk Prevention Plan  Transportation Screening Complete Complete Complete  Medication Review (RN Care Manager) Complete Complete Referral to Pharmacy  PCP or Specialist appointment within 3-5 days of discharge Complete  Complete  HRI or Home Care Consult Complete Complete Complete  SW Recovery Care/Counseling Consult Complete Complete Complete  Palliative Care Screening Not Applicable Not Applicable Not Applicable  Skilled Nursing Facility Complete Not Applicable Not Applicable

## 2023-07-01 NOTE — Plan of Care (Signed)
  Problem: Health Behavior/Discharge Planning: Goal: Ability to manage health-related needs will improve Outcome: Progressing   Problem: Clinical Measurements: Goal: Ability to maintain clinical measurements within normal limits will improve Outcome: Progressing Goal: Will remain free from infection Outcome: Progressing Goal: Diagnostic test results will improve Outcome: Progressing Goal: Respiratory complications will improve Outcome: Progressing Goal: Cardiovascular complication will be avoided Outcome: Progressing   Problem: Activity: Goal: Risk for activity intolerance will decrease Outcome: Progressing   Problem: Nutrition: Goal: Adequate nutrition will be maintained Outcome: Progressing   Problem: Coping: Goal: Level of anxiety will decrease Outcome: Progressing   Problem: Elimination: Goal: Will not experience complications related to bowel motility Outcome: Progressing Goal: Will not experience complications related to urinary retention Outcome: Progressing   Problem: Pain Managment: Goal: General experience of comfort will improve and/or be controlled Outcome: Progressing   Problem: Safety: Goal: Ability to remain free from injury will improve Outcome: Progressing   Problem: Skin Integrity: Goal: Risk for impaired skin integrity will decrease Outcome: Progressing   Problem: Education: Goal: Knowledge of the prescribed therapeutic regimen will improve Outcome: Progressing Goal: Ability to verbalize activity precautions or restrictions will improve Outcome: Progressing Goal: Understanding of discharge needs will improve Outcome: Progressing   Problem: Activity: Goal: Ability to perform//tolerate increased activity and mobilize with assistive devices will improve Outcome: Progressing   Problem: Clinical Measurements: Goal: Postoperative complications will be avoided or minimized Outcome: Progressing   Problem: Self-Care: Goal: Ability to meet  self-care needs will improve Outcome: Progressing   Problem: Self-Concept: Goal: Ability to maintain and perform role responsibilities to the fullest extent possible will improve Outcome: Progressing   Problem: Pain Management: Goal: Pain level will decrease with appropriate interventions Outcome: Progressing   Problem: Skin Integrity: Goal: Demonstration of wound healing without infection will improve Outcome: Progressing

## 2023-07-02 DIAGNOSIS — R627 Adult failure to thrive: Secondary | ICD-10-CM | POA: Diagnosis not present

## 2023-07-02 LAB — CBC WITH DIFFERENTIAL/PLATELET
Abs Immature Granulocytes: 0.06 10*3/uL (ref 0.00–0.07)
Basophils Absolute: 0.1 10*3/uL (ref 0.0–0.1)
Basophils Relative: 1 %
Eosinophils Absolute: 1.2 10*3/uL — ABNORMAL HIGH (ref 0.0–0.5)
Eosinophils Relative: 10 %
HCT: 22.9 % — ABNORMAL LOW (ref 39.0–52.0)
Hemoglobin: 7 g/dL — ABNORMAL LOW (ref 13.0–17.0)
Immature Granulocytes: 1 %
Lymphocytes Relative: 12 %
Lymphs Abs: 1.3 10*3/uL (ref 0.7–4.0)
MCH: 22.8 pg — ABNORMAL LOW (ref 26.0–34.0)
MCHC: 30.6 g/dL (ref 30.0–36.0)
MCV: 74.6 fL — ABNORMAL LOW (ref 80.0–100.0)
Monocytes Absolute: 1.3 10*3/uL — ABNORMAL HIGH (ref 0.1–1.0)
Monocytes Relative: 12 %
Neutro Abs: 7.4 10*3/uL (ref 1.7–7.7)
Neutrophils Relative %: 64 %
Platelets: 222 10*3/uL (ref 150–400)
RBC: 3.07 MIL/uL — ABNORMAL LOW (ref 4.22–5.81)
RDW: 25 % — ABNORMAL HIGH (ref 11.5–15.5)
WBC: 11.3 10*3/uL — ABNORMAL HIGH (ref 4.0–10.5)
nRBC: 0 % (ref 0.0–0.2)

## 2023-07-02 LAB — RENAL FUNCTION PANEL
Albumin: 2.1 g/dL — ABNORMAL LOW (ref 3.5–5.0)
Anion gap: 16 — ABNORMAL HIGH (ref 5–15)
BUN: 111 mg/dL — ABNORMAL HIGH (ref 6–20)
CO2: 20 mmol/L — ABNORMAL LOW (ref 22–32)
Calcium: 9.5 mg/dL (ref 8.9–10.3)
Chloride: 106 mmol/L (ref 98–111)
Creatinine, Ser: 10.38 mg/dL — ABNORMAL HIGH (ref 0.61–1.24)
GFR, Estimated: 5 mL/min — ABNORMAL LOW (ref >60)
Glucose, Bld: 93 mg/dL (ref 70–99)
Phosphorus: 7.1 mg/dL — ABNORMAL HIGH (ref 2.5–4.6)
Potassium: 5.4 mmol/L — ABNORMAL HIGH (ref 3.5–5.1)
Sodium: 142 mmol/L (ref 135–145)

## 2023-07-02 LAB — GLUCOSE, CAPILLARY
Glucose-Capillary: 84 mg/dL (ref 70–99)
Glucose-Capillary: 116 mg/dL — ABNORMAL HIGH (ref 70–99)
Glucose-Capillary: 75 mg/dL (ref 70–99)

## 2023-07-02 NOTE — Plan of Care (Signed)
  Problem: Health Behavior/Discharge Planning: Goal: Ability to manage health-related needs will improve Outcome: Progressing   Problem: Clinical Measurements: Goal: Ability to maintain clinical measurements within normal limits will improve Outcome: Progressing Goal: Will remain free from infection Outcome: Progressing Goal: Diagnostic test results will improve Outcome: Progressing Goal: Respiratory complications will improve Outcome: Progressing Goal: Cardiovascular complication will be avoided Outcome: Progressing   Problem: Activity: Goal: Risk for activity intolerance will decrease Outcome: Progressing   Problem: Nutrition: Goal: Adequate nutrition will be maintained Outcome: Progressing   Problem: Coping: Goal: Level of anxiety will decrease Outcome: Progressing   Problem: Elimination: Goal: Will not experience complications related to bowel motility Outcome: Progressing Goal: Will not experience complications related to urinary retention Outcome: Progressing   Problem: Pain Managment: Goal: General experience of comfort will improve and/or be controlled Outcome: Progressing   Problem: Safety: Goal: Ability to remain free from injury will improve Outcome: Progressing   Problem: Education: Goal: Knowledge of the prescribed therapeutic regimen will improve Outcome: Progressing Goal: Ability to verbalize activity precautions or restrictions will improve Outcome: Progressing Goal: Understanding of discharge needs will improve Outcome: Progressing   Problem: Skin Integrity: Goal: Risk for impaired skin integrity will decrease Outcome: Progressing

## 2023-07-02 NOTE — Progress Notes (Signed)
 PROGRESS NOTE  [name]. 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  He was found to have an infected ischemic wound of the left foot, eventually underwent left AKA on 5/2  Subjective / 24h Interval events: Appears comfortable, no significant complaints  Assesement and Plan: Active Problems:   C. difficile colitis   Syncope   Hypoglycemia   Ischemic ulcer of left heel (HCC)   Failure to thrive in adult   Gangrene of left foot (HCC)   Malnutrition of moderate degree  Principal problem Syncopal episode, hypoglycemia -slumped over and unconscious in PCPs office.  This is likely due to poor p.o. intake - Encourage p.o. intake, no hypoglycemia since 5/8 - Cortisol adequate at 11.1 at around 10 AM, no adrenal insufficiency.  TSH unremarkable  Active problems Infected ischemic ulcer of the left heel - History of gangrenous wounds on the right and has ultimately had a right AKA.  On admission he was found to have an infected ulcer of the left heel, orthopedic surgery consulted, he is now status post left AKA on 5/2.  Initially he was maintained on broad-spectrum antibiotics, now discontinued followed by amputation.  Wound VAC removed 5/8  Diarrhea -positive for C. difficile and norovirus on admission.  Continue p.o. vancomycin , diarrhea now improving.  Required rectal tube initially, now removed on 5/4 - Improving  Failure to thrive - Progressive weakness now wheelchair-bound and using a Hoyer; despite recent SNF stay. diminished oral intake only taking 1 meal per day. - Nutrition consulted - PT/OT consulted,  recs for SNF, placement pending, unfortunately insurance denied - Awaiting other avenues for placing   Troponin elevation - No history of chest pain. High - sensitivity troponin low level elevated and flat 28 -> 30 which is consistent with demand ischemia, also underlying ESRD.  - Continue aspirin , statin, Coreg    Sacral wound - WOC consulted per above   ESRD,Tu/Th/Sat HD -Nephroogy consulted for HD   CAD history STEMI / Hx CVA - Continue home aspirin , atorvastatin  80 mg,  Coreg  12.5 mg twice daily   HFpEF -  Without acute exacerbation.  Not on diuretics   Paroxysmal A-fib off anticoagulation - currently in sinus rhythm, not on anticoagulation.  Continue home amiodarone , coreg  per above.    Hx IPH - Noted, off AC.    Seizure disorder - Continue home Keppra  500 mg nondialysis days, 750 mg on HD days.   DM type II diet controlled - Recent hypoglycemia, not on diabetic medications.  Every 6hr CBG checks, hypoglycemia protocol    Hypertension - continue home antihypertensives, blood pressure stable and acceptable  Chronic anemia - Hx anemia CKD, received 2 units of packed red blood cells.  Hemoglobin 7.2 and has remained stable today   Enucleation OS, hx DM retinopathy, glaucoma - Noted, ophtho f/u OP.   Scheduled Meds:  acidophilus  2 capsule Oral TID   amiodarone   200 mg Oral Daily   vitamin C   1,000 mg Oral Daily   aspirin  EC  81 mg Oral Q breakfast   atorvastatin   80 mg Oral QHS   calcium  acetate  1,334 mg Oral  TID WC   carvedilol   25 mg Oral BID   Chlorhexidine  Gluconate Cloth  6 each Topical Q0600   darbepoetin (ARANESP ) injection - NON-DIALYSIS  100 mcg Subcutaneous Q Thu-1800   feeding supplement (NEPRO CARB STEADY)  237 mL Oral TID BM   heparin   5,000 Units Subcutaneous Q8H   levETIRAcetam   500 mg Oral Once per day on Sunday Tuesday Thursday Saturday   levETIRAcetam  750 mg Oral Once per day on Monday Wednesday Friday   liver oil-zinc oxide   Topical BID    multivitamin  1 tablet Oral QHS   nutrition supplement (JUVEN)  1 packet Oral BID BM   pregabalin  50 mg Oral Daily   sodium chloride flush  3 mL Intravenous Q12H   zinc sulfate (50mg elemental zinc)  220 mg Oral Daily   Continuous Infusions:  sodium chloride 10 mL/hr at 06/24/23 2003   PRN Meds:.acetaminophen, hydrALAZINE, HYDROmorphone (DILAUDID) injection, melatonin, ondansetron (ZOFRAN) IV, polyethylene glycol  Current Outpatient Medications  Medication Instructions   acetaminophen (TYLENOL) 650 mg, Oral, Every 6 hours PRN   Amino Acids-Protein Hydrolys (PRO-STAT AWC) LIQD 30 mLs, Every morning   amiodarone (PACERONE) 200 mg, Oral, Daily   ammonium lactate (AMLACTIN DAILY) 12 % lotion Apply topically.   ascorbic acid (VITAMIN C) 1,000 mg, Daily   aspirin EC 81 mg, Oral, Daily with breakfast, Swallow whole.   atorvastatin (LIPITOR) 80 mg, Oral, Daily at bedtime   brimonidine (ALPHAGAN) 0.2 % ophthalmic solution 1 drop, Right Eye, 3 times daily   calcitRIOL (ROCALTROL) 2.25 mcg, Oral, Every T-Th-Sa (1800)   calcium acetate (PHOSLO) 1,334 mg, 3 times daily   carvedilol (COREG) 12.5 mg, Oral, 2 times daily   cinacalcet (SENSIPAR) 60 mg, Oral, Every T-Th-Sa (1800)   dorzolamide-timolol (COSOPT) 2-0.5 % ophthalmic solution 1 drop, 2 times daily   levETIRAcetam (KEPPRA) 500 mg, Oral, 4 times weekly, On Sunday, Monday, Wednesday and Friday, all non-dialysis days   levETIRAcetam (KEPPRA) 750 mg, Oral, 3 times weekly, On Tuesday Thursday and Saturday, on dialysis days   lidocaine-prilocaine (EMLA) cream 1 Application, 3 times weekly   loperamide (IMODIUM) 2 mg, Oral, As needed   multivitamin (RENA-VIT) TABS tablet 1 tablet, Oral, Daily at bedtime   Nutritional Supplements (,FEEDING SUPPLEMENT, PROSOURCE PLUS) liquid 30 mLs, Oral, 3 times daily between meals   ondansetron (ZOFRAN) 4 mg, Oral, Every 6 hours PRN   oxyCODONE (OXY IR/ROXICODONE) 5 mg, Oral, Every 4 hours PRN   polyethylene  glycol (MIRALAX / GLYCOLAX) 17 g, Oral, Daily   Zinc Oxide (DESITIN MAXIMUM STRENGTH) 40 % PSTE 1 Application, Apply externally, 2 times daily PRN    Diet Orders (From admission, onward)     Start     Ordered   06/30/23 1518  Diet regular Room service appropriate? Yes; Fluid consistency: Thin; Fluid restriction: 1200 mL Fluid  Diet effective now       Question Answer Comment  Room service appropriate? Yes   Fluid consistency: Thin   Fluid restriction: 1200 mL Fluid      05 /08/25 1518            DVT prophylaxis: heparin  injection 5,000 Units Start: 06/21/23 0600   Lab Results  Component Value Date   PLT 222 07/02/2023      Code Status: Limited: Do not attempt resuscitation (DNR) -DNR-LIMITED -Do Not Intubate/DNI   Family Communication: no family at bedside   Status is: Inpatient Remains inpatient appropriate because: placement   Level of  care: Telemetry Medical  Consultants:  Nephrology   Objective: Vitals:   07/02/23 0900 07/02/23 0930 07/02/23 1000 07/02/23 1023  BP: (!) 117/50 (!) 105/52 114/60 115/60  Pulse: 69 68 66 70  Resp: 13 20 18 17   Temp:      TempSrc:      SpO2: 97% 96% 100% 96%  Weight:      Height:        Intake/Output Summary (Last 24 hours) at 07/02/2023 1036 Last data filed at 07/02/2023 1610 Gross per 24 hour  Intake 807.67 ml  Output 0 ml  Net 807.67 ml   Wt Readings from Last 3 Encounters:  07/02/23 70.2 kg  06/16/23 74.4 kg  05/30/23 74.4 kg    Examination:  Constitutional: NAD Eyes: lids and conjunctivae normal, no scleral icterus ENMT: mmm Neck: normal, supple Respiratory: clear to auscultation bilaterally, no wheezing, no crackles.  Cardiovascular: Regular rate and rhythm, no murmurs / rubs / gallops.  Abdomen: soft, no distention, no tenderness. Bowel sounds positive.   Data Reviewed: I have independently reviewed following labs and imaging studies   CBC Recent Labs  Lab 06/28/23 0826 06/29/23 0829  06/30/23 0545 07/01/23 0727 07/02/23 0800  WBC 10.7* 11.4* 10.2 10.3 11.3*  HGB 6.8* 7.2* 7.2* 7.4* 7.0*  HCT 21.8* 23.3* 22.8* 23.8* 22.9*  PLT 174 188 194 220 222  MCV 71.7* 74.9* 75.2* 74.4* 74.6*  MCH 22.4* 23.2* 23.8* 23.1* 22.8*  MCHC 31.2 30.9 31.6 31.1 30.6  RDW 24.4* 23.9* 24.0* 24.0* 25.0*  LYMPHSABS  --   --   --  1.4 1.3  MONOABS  --   --   --  1.2* 1.3*  EOSABS  --   --   --  1.1* 1.2*  BASOSABS  --   --   --  0.1 0.1    Recent Labs  Lab 06/26/23 0721 06/28/23 0826 06/29/23 0829 06/30/23 0545 07/01/23 0727 07/02/23 0800  NA 144 138 141 138 139 142  K 4.6 3.8 4.5 4.5 4.9 5.4*  CL 114* 104 107 100 102 106  CO2 15* 21* 21* 25 24 20*  GLUCOSE 104* 120* 80 87 107* 93  BUN 83* 58* 81* 58* 85* 111*  CREATININE 13.09* 8.63* 10.31* 7.24* 8.96* 10.38*  CALCIUM  9.0 8.9 9.1 8.9 9.1 9.5  AST 11* 15 43* 32 70*  --   ALT 12 11 30 31  63*  --   ALKPHOS 54 52 54 52 66  --   BILITOT 0.6 0.8 0.8 0.7 0.6  --   ALBUMIN  2.3* 2.3* 2.1* 2.1* 2.1* 2.1*  MG  --   --   --  2.1 2.3  --     ------------------------------------------------------------------------------------------------------------------ No results for input(s): "CHOL", "HDL", "LDLCALC", "TRIG", "CHOLHDL", "LDLDIRECT" in the last 72 hours.  Lab Results  Component Value Date   HGBA1C 5.0 05/05/2023   ------------------------------------------------------------------------------------------------------------------ No results for input(s): "TSH", "T4TOTAL", "T3FREE", "THYROIDAB" in the last 72 hours.  Invalid input(s): "FREET3"  Cardiac Enzymes No results for input(s): "CKMB", "TROPONINI", "MYOGLOBIN" in the last 168 hours.  Invalid input(s): "CK" ------------------------------------------------------------------------------------------------------------------    Component Value Date/Time   BNP 1,416.0 (H) 11/21/2021 1117    CBG: Recent Labs  Lab 06/29/23 0008 06/30/23 0622 06/30/23 1226 06/30/23 2150  07/01/23 0519  GLUCAP 115* 69* 130* 116* 115*    Recent Results (from the past 240 hours)  Surgical pcr screen     Status: None   Collection Time: 06/24/23 12:03 PM  Specimen: Nasal Mucosa; Nasal Swab  Result Value Ref Range Status   MRSA, PCR NEGATIVE NEGATIVE Final   Staphylococcus aureus NEGATIVE NEGATIVE Final    Comment: (NOTE) The Xpert SA Assay (FDA approved for NASAL specimens in patients 92 years of age and older), is one component of a comprehensive surveillance program. It is not intended to diagnose infection nor to guide or monitor treatment. Performed at [facility], [address], [place], [place] [address]   Culture, blood (Routine X 2) w Reflex to ID Panel     Status: None   Collection Time: 06/25/23  9:10 AM   Specimen: BLOOD RIGHT ARM  Result Value Ref Range Status   Specimen Description BLOOD RIGHT ARM  Final   Special Requests   Final    BOTTLES DRAWN AEROBIC AND ANAEROBIC Blood Culture adequate volume   Culture   Final    NO GROWTH 5 DAYS Performed at [facility], [address] [place], [place] [address]    Report Status 06/30/2023 FINAL  Final  Culture, blood (Routine X 2) w Reflex to ID Panel     Status: None   Collection Time: 06/25/23  9:11 AM   Specimen: BLOOD RIGHT ARM  Result Value Ref Range Status   Specimen Description BLOOD RIGHT ARM  Final   Special Requests   Final    BOTTLES DRAWN AEROBIC AND ANAEROBIC Blood Culture adequate volume   Culture   Final    NO GROWTH 5 DAYS Performed at [facility], [address] [place], [place] [address]    Report Status 06/30/2023 FINAL  Final     Radiology Studies: No results found.   Kathlen Para, MD, PhD [facility]  Between 7 am - 7 pm I am available, please contact me via Amion (for emergencies) or Securechat (non urgent messages)  Between 7 pm - 7 am I am not available, please contact night coverage MD/APP via Amion

## 2023-07-02 NOTE — Progress Notes (Signed)
 [facility] Progress Note   Subjective:    Seen in [facility] - on dialysis. Appears comfortable. Not very interactive.   Objective Vitals:   07/02/23 0753 07/02/23 0800 07/02/23 0830 07/02/23 0900  BP: (!) 113/43 (!) 107/51 (!) 114/57 (!) 117/50  Pulse: 66 66 68 69  Resp: 15 14 15 13   Temp: 98.6 F (37 C)     TempSrc:      SpO2: 95% 96% 96% 97%  Weight: 70.2 kg     Height:       Physical Exam General: Alert, oriented, NAD; on RA Heart: RRR Lungs: Clear, normal wob Abdomen: Soft, non-distended Extremities: R AKA, L stump w/wound vac  Dialysis Access: LUE AVF + t/b  Filed Weights   06/29/23 0819 06/29/23 1215 07/02/23 0753  Weight: 69.9 kg 69.9 kg 70.2 kg    Intake/Output Summary (Last 24 hours) at 07/02/2023 0909 Last data filed at 07/01/2023 1828 Gross per 24 hour  Intake 2284.67 ml  Output 0 ml  Net 2284.67 ml    Additional Objective Labs: Basic Metabolic Panel: Recent Labs  Lab 06/30/23 0545 07/01/23 0727 07/02/23 0800  NA 138 139 142  K 4.5 4.9 5.4*  CL 100 102 106  CO2 25 24 20*  GLUCOSE 87 107* 93  BUN 58* 85* 111*  CREATININE 7.24* 8.96* 10.38*  CALCIUM  8.9 9.1 9.5  PHOS 5.4* 6.1* 7.1*   Liver Function Tests: Recent Labs  Lab 06/29/23 0829 06/30/23 0545 07/01/23 0727 07/02/23 0800  AST 43* 32 70*  --   ALT 30 31 63*  --   ALKPHOS 54 52 66  --   BILITOT 0.8 0.7 0.6  --   PROT 5.7* 5.7* 5.9*  --   ALBUMIN  2.1* 2.1* 2.1* 2.1*   No results for input(s): "LIPASE", "AMYLASE" in the last 168 hours. CBC: Recent Labs  Lab 06/28/23 0826 06/29/23 0829 06/30/23 0545 07/01/23 0727 07/02/23 0800  WBC 10.7* 11.4* 10.2 10.3 11.3*  NEUTROABS  --   --   --  6.6 7.4  HGB 6.8* 7.2* 7.2* 7.4* 7.0*  HCT 21.8* 23.3* 22.8* 23.8* 22.9*  MCV 71.7* 74.9* 75.2* 74.4* 74.6*  PLT 174 188 194 220 222   Blood Culture    Component Value Date/Time   SDES BLOOD RIGHT ARM 06/25/2023 0911   SPECREQUEST  06/25/2023 0911    BOTTLES DRAWN AEROBIC AND  ANAEROBIC Blood Culture adequate volume   CULT  06/25/2023 0911    NO GROWTH 5 DAYS Performed at [facility], [address], [place], [place] [address]    REPTSTATUS 06/30/2023 FINAL 06/25/2023 0911    Cardiac Enzymes: No results for input(s): "CKTOTAL", "CKMB", "CKMBINDEX", "TROPONINI" in the last 168 hours. CBG: Recent Labs  Lab 06/29/23 0008 06/30/23 0622 06/30/23 1226 06/30/23 2150 07/01/23 0519  GLUCAP 115* 69* 130* 116* 115*   Iron Studies: No results for input(s): "IRON", "TIBC", "TRANSFERRIN", "FERRITIN" in the last 72 hours. Lab Results  Component Value Date   INR 1.2 05/06/2023   INR 1.2 05/05/2023   INR 1.1 03/01/2022   Studies/Results: No results found.  Medications:  sodium chloride  10 mL/hr at 06/24/23 2003    acidophilus  2 capsule Oral TID   amiodarone   200 mg Oral Daily   vitamin C   1,000 mg Oral Daily   aspirin  EC  81 mg Oral Q breakfast   atorvastatin   80 mg Oral QHS   calcium  acetate  1,334 mg Oral TID WC  carvedilol   25 mg Oral BID   Chlorhexidine  Gluconate Cloth  6 each Topical Q0600   darbepoetin (ARANESP ) injection - NON-DIALYSIS  100 mcg Subcutaneous Q Thu-1800   feeding supplement (NEPRO CARB STEADY)  237 mL Oral TID BM   heparin   5,000 Units Subcutaneous Q8H   levETIRAcetam   500 mg Oral Once per day on Sunday Tuesday Thursday Saturday   levETIRAcetam   750 mg Oral Once per day on Monday Wednesday Friday   liver oil-zinc  oxide   Topical BID   multivitamin  1 tablet Oral QHS   nutrition supplement (JUVEN)  1 packet Oral BID BM   pregabalin   50 mg Oral Daily   sodium chloride  flush  3 mL Intravenous Q12H   zinc  sulfate (50mg  elemental zinc )  220 mg Oral Daily    Dialysis Orders: [facility] TTS 3 hr  edw 77kg,  LUE AVF   Heparin  1000 bolus + 400u/hr Mircera 100 mcg  q 2wks due  4/28 ( was no show)  No vitamin D   Assessment/Plan: Infected wound L foot: hx of R AKA for similar issues. --- S/p L AKA on 5/2  ESRD:  on HD TTS. Back on schedule. HD today 5/10 HTN/Vol: Soft BPs. Stable. No volume on exam. Minimal UF with HD.  Anemia:  S/p 1 unit PRBCs 5/6. Transfuse prn.  On Aranesp  100 q Thus -last on 5/8. Check Fe studies, now off IV abx.   Secondary hyperparathyroidism: CorrCa elevated -not on Vit D. Phos above goal on Ca acetate binder --follow trends.  Nutrition: optimize as able. Prot supp per RD.  Seizure d/o - on keppra  DM2 - per pmd Diarrhea - +C. Diff and norovirus on admit. - s/p po vanc  FTT - poor po intake, sacral wound, recs for SNF    Jaylan Hinojosa Alberto Hughes PA-C [facility] 07/02/2023,9:10 AM

## 2023-07-02 NOTE — Plan of Care (Signed)
   Problem: Clinical Measurements: Goal: Ability to maintain clinical measurements within normal limits will improve Outcome: Progressing

## 2023-07-02 NOTE — Plan of Care (Signed)
  Problem: Health Behavior/Discharge Planning: Goal: Ability to manage health-related needs will improve Outcome: Progressing   Problem: Clinical Measurements: Goal: Ability to maintain clinical measurements within normal limits will improve Outcome: Progressing Goal: Will remain free from infection Outcome: Progressing Goal: Diagnostic test results will improve Outcome: Progressing Goal: Respiratory complications will improve Outcome: Progressing Goal: Cardiovascular complication will be avoided Outcome: Progressing   Problem: Activity: Goal: Risk for activity intolerance will decrease Outcome: Progressing   Problem: Nutrition: Goal: Adequate nutrition will be maintained Outcome: Progressing   Problem: Coping: Goal: Level of anxiety will decrease Outcome: Progressing   Problem: Elimination: Goal: Will not experience complications related to bowel motility Outcome: Progressing Goal: Will not experience complications related to urinary retention Outcome: Progressing   Problem: Pain Managment: Goal: General experience of comfort will improve and/or be controlled Outcome: Progressing   Problem: Safety: Goal: Ability to remain free from injury will improve Outcome: Progressing   Problem: Skin Integrity: Goal: Risk for impaired skin integrity will decrease Outcome: Progressing   Problem: Education: Goal: Knowledge of the prescribed therapeutic regimen will improve Outcome: Progressing Goal: Ability to verbalize activity precautions or restrictions will improve Outcome: Progressing Goal: Understanding of discharge needs will improve Outcome: Progressing   Problem: Activity: Goal: Ability to perform//tolerate increased activity and mobilize with assistive devices will improve Outcome: Progressing   Problem: Clinical Measurements: Goal: Postoperative complications will be avoided or minimized Outcome: Progressing   Problem: Self-Care: Goal: Ability to meet  self-care needs will improve Outcome: Progressing   Problem: Self-Concept: Goal: Ability to maintain and perform role responsibilities to the fullest extent possible will improve Outcome: Progressing   Problem: Pain Management: Goal: Pain level will decrease with appropriate interventions Outcome: Progressing   Problem: Skin Integrity: Goal: Demonstration of wound healing without infection will improve Outcome: Progressing

## 2023-07-02 NOTE — Progress Notes (Signed)
   07/02/23 1113  Vitals  Temp 98.1 F (36.7 C)  Pulse Rate 72  Resp 15  BP (!) 118/55  SpO2 99 %  Weight 69.5 kg  Post Treatment  Dialyzer Clearance Lightly streaked  Hemodialysis Intake (mL) 0 mL  Liters Processed 65.5  Fluid Removed (mL) 900 mL  Tolerated HD Treatment Yes  AVG/AVF Arterial Site Held (minutes) 8 minutes  AVG/AVF Venous Site Held (minutes) 8 minutes   Received patient in bed to unit.  Alert and oriented.  Informed consent signed and in chart.   TX duration:2hrs 48 mins  Patient tolerated well.  Transported back to the room  Alert, without acute distress.  Hand-off given to patient's nurse.   Access used: LAVF Access issues: none  Total UF removed: Medication(s) given: none   [facility]

## 2023-07-03 DIAGNOSIS — R627 Adult failure to thrive: Secondary | ICD-10-CM | POA: Diagnosis not present

## 2023-07-03 LAB — FERRITIN: Ferritin: 1569 ng/mL — ABNORMAL HIGH (ref 24–336)

## 2023-07-03 LAB — RENAL FUNCTION PANEL
Albumin: 2.1 g/dL — ABNORMAL LOW (ref 3.5–5.0)
Anion gap: 14 (ref 5–15)
BUN: 78 mg/dL — ABNORMAL HIGH (ref 6–20)
CO2: 21 mmol/L — ABNORMAL LOW (ref 22–32)
Calcium: 9.3 mg/dL (ref 8.9–10.3)
Chloride: 105 mmol/L (ref 98–111)
Creatinine, Ser: 7.57 mg/dL — ABNORMAL HIGH (ref 0.61–1.24)
GFR, Estimated: 8 mL/min — ABNORMAL LOW (ref >60)
Glucose, Bld: 92 mg/dL (ref 70–99)
Phosphorus: 5.7 mg/dL — ABNORMAL HIGH (ref 2.5–4.6)
Potassium: 5.3 mmol/L — ABNORMAL HIGH (ref 3.5–5.1)
Sodium: 140 mmol/L (ref 135–145)

## 2023-07-03 LAB — IRON AND TIBC
Iron: 11 ug/dL — ABNORMAL LOW (ref 45–182)
Saturation Ratios: 7 % — ABNORMAL LOW (ref 17.9–39.5)
TIBC: 158 ug/dL — ABNORMAL LOW (ref 250–450)
UIBC: 147 ug/dL

## 2023-07-03 LAB — CBC
HCT: 24.8 % — ABNORMAL LOW (ref 39.0–52.0)
Hemoglobin: 7.6 g/dL — ABNORMAL LOW (ref 13.0–17.0)
MCH: 22.9 pg — ABNORMAL LOW (ref 26.0–34.0)
MCHC: 30.6 g/dL (ref 30.0–36.0)
MCV: 74.7 fL — ABNORMAL LOW (ref 80.0–100.0)
Platelets: 234 10*3/uL (ref 150–400)
RBC: 3.32 MIL/uL — ABNORMAL LOW (ref 4.22–5.81)
RDW: 24.7 % — ABNORMAL HIGH (ref 11.5–15.5)
WBC: 11.5 10*3/uL — ABNORMAL HIGH (ref 4.0–10.5)
nRBC: 0 % (ref 0.0–0.2)

## 2023-07-03 LAB — GLUCOSE, CAPILLARY: Glucose-Capillary: 120 mg/dL — ABNORMAL HIGH (ref 70–99)

## 2023-07-03 NOTE — Progress Notes (Signed)
 [facility] Progress Note   Subjective:    Seen in room. No issues with dialysis yesterday. No new events.   Objective Vitals:   07/03/23 0243 07/03/23 0810 07/03/23 0906 07/03/23 1002  BP: (!) 121/49 109/62    Pulse: 70 72    Resp: 18 16 18 16   Temp: (!) 100.9 F (38.3 C) 99.1 F (37.3 C)    TempSrc:  Oral    SpO2:  99%    Weight:      Height:       Physical Exam General: Alert, oriented, nad Heart: RRR Lungs: Clear, normal wob Abdomen: Soft, non-distended Extremities: R AKA, L stump w/wound vac  Dialysis Access: LUE AVF + t/b  Filed Weights   06/29/23 1215 07/02/23 0753 07/02/23 1113  Weight: 69.9 kg 70.2 kg 69.5 kg    Intake/Output Summary (Last 24 hours) at 07/03/2023 1053 Last data filed at 07/02/2023 2200 Gross per 24 hour  Intake 1120 ml  Output 900 ml  Net 220 ml    Additional Objective Labs: Basic Metabolic Panel: Recent Labs  Lab 07/01/23 0727 07/02/23 0800 07/03/23 0458  NA 139 142 140  K 4.9 5.4* 5.3*  CL 102 106 105  CO2 24 20* 21*  GLUCOSE 107* 93 92  BUN 85* 111* 78*  CREATININE 8.96* 10.38* 7.57*  CALCIUM  9.1 9.5 9.3  PHOS 6.1* 7.1* 5.7*   Liver Function Tests: Recent Labs  Lab 06/29/23 0829 06/30/23 0545 07/01/23 0727 07/02/23 0800 07/03/23 0458  AST 43* 32 70*  --   --   ALT 30 31 63*  --   --   ALKPHOS 54 52 66  --   --   BILITOT 0.8 0.7 0.6  --   --   PROT 5.7* 5.7* 5.9*  --   --   ALBUMIN  2.1* 2.1* 2.1* 2.1* 2.1*   No results for input(s): "LIPASE", "AMYLASE" in the last 168 hours. CBC: Recent Labs  Lab 06/29/23 0829 06/30/23 0545 07/01/23 0727 07/02/23 0800 07/03/23 0458  WBC 11.4* 10.2 10.3 11.3* 11.5*  NEUTROABS  --   --  6.6 7.4  --   HGB 7.2* 7.2* 7.4* 7.0* 7.6*  HCT 23.3* 22.8* 23.8* 22.9* 24.8*  MCV 74.9* 75.2* 74.4* 74.6* 74.7*  PLT 188 194 220 222 234   Blood Culture    Component Value Date/Time   SDES BLOOD RIGHT ARM 06/25/2023 0911   SPECREQUEST  06/25/2023 0911    BOTTLES  DRAWN AEROBIC AND ANAEROBIC Blood Culture adequate volume   CULT  06/25/2023 0911    NO GROWTH 5 DAYS Performed at [facility], [address], [place], [place] [address]    REPTSTATUS 06/30/2023 FINAL 06/25/2023 0911    Cardiac Enzymes: No results for input(s): "CKTOTAL", "CKMB", "CKMBINDEX", "TROPONINI" in the last 168 hours. CBG: Recent Labs  Lab 06/30/23 2150 07/01/23 0519 07/02/23 1146 07/02/23 1729 07/02/23 2043  GLUCAP 116* 115* 84 116* 75   Iron Studies:  Recent Labs    07/03/23 0458  IRON 11*  TIBC 158*  FERRITIN 1,569*   Lab Results  Component Value Date   INR 1.2 05/06/2023   INR 1.2 05/05/2023   INR 1.1 03/01/2022   Studies/Results: No results found.  Medications:  sodium chloride  10 mL/hr at 06/24/23 2003    acidophilus  2 capsule Oral TID   amiodarone   200 mg Oral Daily   vitamin C   1,000 mg Oral Daily   aspirin  EC  81 mg  Oral Q breakfast   atorvastatin   80 mg Oral QHS   calcium  acetate  1,334 mg Oral TID WC   carvedilol   25 mg Oral BID   Chlorhexidine  Gluconate Cloth  6 each Topical Q0600   darbepoetin (ARANESP ) injection - NON-DIALYSIS  100 mcg Subcutaneous Q Thu-1800   feeding supplement (NEPRO CARB STEADY)  237 mL Oral TID BM   heparin   5,000 Units Subcutaneous Q8H   levETIRAcetam   500 mg Oral Once per day on Sunday Tuesday Thursday Saturday   levETIRAcetam   750 mg Oral Once per day on Monday Wednesday Friday   liver oil-zinc  oxide   Topical BID   multivitamin  1 tablet Oral QHS   nutrition supplement (JUVEN)  1 packet Oral BID BM   pregabalin   50 mg Oral Daily   sodium chloride  flush  3 mL Intravenous Q12H   zinc  sulfate (50mg  elemental zinc )  220 mg Oral Daily    Dialysis Orders: [facility] TTS 3 hr  edw 77kg,  LUE AVF   Heparin  1000 bolus + 400u/hr Mircera 100 mcg  q 2wks due  4/28 ( was no show)  No vitamin D   Assessment/Plan: Infected wound L foot: hx of R AKA for similar issues. --- S/p L AKA on 5/2   ESRD: on HD TTS.  Next HD Tues  HTN/Vol: Soft BPs. Stable. No volume on exam. Minimal UF with HD.  Anemia:  S/p 1 unit PRBCs 5/6. Transfuse prn.  On Aranesp  100 q Thus -last on 5/8. Check Fe studies, now off IV abx.   Secondary hyperparathyroidism: CorrCa elevated -not on Vit D. Phos above goal on Ca acetate binder --follow trends.  Nutrition: optimize as able. Prot supp per RD.  Seizure d/o - on keppra  DM2 - per pmd Diarrhea - +C. Diff and norovirus on admit. - s/p po vanc  FTT - poor po intake, sacral wound, recs for SNF    Lawanna Cecere Alberto Hughes PA-C [facility] 07/03/2023,10:53 AM

## 2023-07-03 NOTE — Plan of Care (Signed)
  Problem: Health Behavior/Discharge Planning: Goal: Ability to manage health-related needs will improve Outcome: Progressing   Problem: Clinical Measurements: Goal: Ability to maintain clinical measurements within normal limits will improve Outcome: Progressing Goal: Will remain free from infection Outcome: Progressing Goal: Diagnostic test results will improve Outcome: Progressing Goal: Respiratory complications will improve Outcome: Progressing Goal: Cardiovascular complication will be avoided Outcome: Progressing   Problem: Activity: Goal: Risk for activity intolerance will decrease Outcome: Progressing   Problem: Nutrition: Goal: Adequate nutrition will be maintained Outcome: Progressing   Problem: Coping: Goal: Level of anxiety will decrease Outcome: Progressing   Problem: Elimination: Goal: Will not experience complications related to bowel motility Outcome: Progressing Goal: Will not experience complications related to urinary retention Outcome: Progressing   Problem: Pain Managment: Goal: General experience of comfort will improve and/or be controlled Outcome: Progressing   Problem: Safety: Goal: Ability to remain free from injury will improve Outcome: Progressing   Problem: Skin Integrity: Goal: Risk for impaired skin integrity will decrease Outcome: Progressing   Problem: Education: Goal: Knowledge of the prescribed therapeutic regimen will improve Outcome: Progressing Goal: Ability to verbalize activity precautions or restrictions will improve Outcome: Progressing Goal: Understanding of discharge needs will improve Outcome: Progressing   Problem: Activity: Goal: Ability to perform//tolerate increased activity and mobilize with assistive devices will improve Outcome: Progressing   Problem: Clinical Measurements: Goal: Postoperative complications will be avoided or minimized Outcome: Progressing   Problem: Self-Care: Goal: Ability to meet  self-care needs will improve Outcome: Progressing   Problem: Self-Concept: Goal: Ability to maintain and perform role responsibilities to the fullest extent possible will improve Outcome: Progressing   Problem: Pain Management: Goal: Pain level will decrease with appropriate interventions Outcome: Progressing   Problem: Skin Integrity: Goal: Demonstration of wound healing without infection will improve Outcome: Progressing

## 2023-07-03 NOTE — Progress Notes (Signed)
 PROGRESS NOTE  [name]. 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  He was found to have an infected ischemic wound of the left foot, eventually underwent left AKA on 5/2  Subjective / 24h Interval events: Comfortable, no complaints  Assesement and Plan: Active Problems:   C. difficile colitis   Syncope   Hypoglycemia   Ischemic ulcer of left heel (HCC)   Failure to thrive in adult   Gangrene of left foot (HCC)   Malnutrition of moderate degree  Principal problem Syncopal episode, hypoglycemia -slumped over and unconscious in PCPs office.  This is likely due to poor p.o. intake - Encourage p.o. intake, no hypoglycemia since 5/8 - Cortisol adequate at 11.1 at around 10 AM, no adrenal insufficiency.  TSH unremarkable  Active problems Infected ischemic ulcer of the left heel - History of gangrenous wounds on the right and has ultimately had a right AKA.  On admission he was found to have an infected ulcer of the left heel, orthopedic surgery consulted, he is now status post left AKA on 5/2.  Initially he was maintained on broad-spectrum antibiotics, now discontinued followed by amputation.  Wound VAC removed 5/8  Diarrhea -positive for C. difficile and norovirus on admission.  Continue p.o. vancomycin , diarrhea now improving.  Required rectal tube initially, now removed on 5/4 - Improving  Fever-100.9 last night, not tachycardic nor hypotensive.  No significant complaints.  Afebrile this morning.  Monitor  Failure to thrive - Progressive weakness now wheelchair-bound and using a [name]; despite recent SNF  stay. diminished oral intake only taking 1 meal per day. - Nutrition consulted - PT/OT consulted, recs for SNF, placement pending, unfortunately insurance denied - Awaiting other avenues for placing, will in process   Troponin elevation - No history of chest pain. High - sensitivity troponin low level elevated and flat 28 -> 30 which is consistent with demand ischemia, also underlying ESRD.  - Continue aspirin , statin, Coreg    Sacral wound - WOC consulted per above   ESRD,Tu/Th/Sat HD -Nephroogy consulted for HD   CAD history STEMI / Hx CVA - Continue home aspirin , atorvastatin  80 mg,  Coreg  12.5 mg twice daily   HFpEF -  Without acute exacerbation.  Not on diuretics   Paroxysmal A-fib off anticoagulation - currently in sinus rhythm, not on anticoagulation.  Continue home amiodarone , coreg  per above.    Hx IPH - Noted, off AC.    Seizure disorder - Continue home Keppra  500 mg nondialysis days, 750 mg on HD days.   DM type II diet controlled - Recent hypoglycemia, not on diabetic medications.  Appears better blood   Hypertension - continue home antihypertensives, pressure stable and acceptable  Chronic anemia - Hx anemia CKD, received 2 units of packed red blood cells.  Hemoglobin 7.6 and overall stable   Enucleation OS, hx DM retinopathy, glaucoma - Noted, ophtho f/u OP.   Scheduled Meds:  acidophilus  2 capsule Oral TID   amiodarone   200 mg Oral Daily   vitamin C   1,000 mg Oral Daily   aspirin  EC  81 mg Oral Q breakfast   atorvastatin   80 mg Oral QHS   calcium  acetate  1,334 mg Oral TID WC   carvedilol   25 mg Oral BID   Chlorhexidine  Gluconate Cloth  6 each Topical Q0600   darbepoetin (ARANESP ) injection - NON-DIALYSIS  100 mcg Subcutaneous Q Thu-1800   feeding supplement (NEPRO CARB STEADY)  237 mL Oral TID BM   heparin   5,000 Units Subcutaneous Q8H   levETIRAcetam   500 mg Oral Once per day on Sunday Tuesday Thursday Saturday   levETIRAcetam  750 mg Oral Once per day on  Monday Wednesday Friday   liver oil-zinc oxide   Topical BID   multivitamin  1 tablet Oral QHS   nutrition supplement (JUVEN)  1 packet Oral BID BM   pregabalin  50 mg Oral Daily   sodium chloride flush  3 mL Intravenous Q12H   zinc sulfate (50mg elemental zinc)  220 mg Oral Daily   Continuous Infusions:  sodium chloride 10 mL/hr at 06/24/23 2003   PRN Meds:.acetaminophen, hydrALAZINE, HYDROmorphone (DILAUDID) injection, melatonin, ondansetron (ZOFRAN) IV, polyethylene glycol  Current Outpatient Medications  Medication Instructions   acetaminophen (TYLENOL) 650 mg, Oral, Every 6 hours PRN   Amino Acids-Protein Hydrolys (PRO-STAT AWC) LIQD 30 mLs, Every morning   amiodarone (PACERONE) 200 mg, Oral, Daily   ammonium lactate (AMLACTIN DAILY) 12 % lotion Apply topically.   ascorbic acid (VITAMIN C) 1,000 mg, Daily   aspirin EC 81 mg, Oral, Daily with breakfast, Swallow whole.   atorvastatin (LIPITOR) 80 mg, Oral, Daily at bedtime   brimonidine (ALPHAGAN) 0.2 % ophthalmic solution 1 drop, Right Eye, 3 times daily   calcitRIOL (ROCALTROL) 2.25 mcg, Oral, Every T-Th-Sa (1800)   calcium acetate (PHOSLO) 1,334 mg, 3 times daily   carvedilol (COREG) 12.5 mg, Oral, 2 times daily   cinacalcet (SENSIPAR) 60 mg, Oral, Every T-Th-Sa (1800)   dorzolamide-timolol (COSOPT) 2-0.5 % ophthalmic solution 1 drop, 2 times daily   levETIRAcetam (KEPPRA) 500 mg, Oral, 4 times weekly, On Sunday, Monday, Wednesday and Friday, all non-dialysis days   levETIRAcetam (KEPPRA) 750 mg, Oral, 3 times weekly, On Tuesday Thursday and Saturday, on dialysis days   lidocaine-prilocaine (EMLA) cream 1 Application, 3 times weekly   loperamide (IMODIUM) 2 mg, Oral, As needed   multivitamin (RENA-VIT) TABS tablet 1 tablet, Oral, Daily at bedtime   Nutritional Supplements (,FEEDING SUPPLEMENT, PROSOURCE PLUS) liquid 30 mLs, Oral, 3 times daily between meals   ondansetron (ZOFRAN) 4 mg, Oral, Every 6 hours PRN   oxyCODONE (OXY  IR/ROXICODONE) 5 mg, Oral, Every 4 hours PRN   polyethylene glycol (MIRALAX / GLYCOLAX) 17 g, Oral, Daily   Zinc Oxide (DESITIN MAXIMUM STRENGTH) 40 % PSTE 1 Application, Apply externally, 2 times daily PRN    Diet Orders (From admission, onward)     Start     Ordered   06/30/23 1518  Diet regular Room service appropriate? Yes; Fluid consistency: Thin; Fluid restriction: 1200 mL Fluid  Diet effective now       Question Answer Comment  Room service appropriate? Yes   Fluid consistency: Thin   Fluid restriction: 1200 mL Fluid      05 /08/25 1518            DVT prophylaxis: heparin  injection 5,000 Units Start: 06/21/23 0600   Lab Results  Component Value Date   PLT 234 07/03/2023      Code Status: Limited: Do not attempt resuscitation (DNR) -DNR-LIMITED -Do Not Intubate/DNI   Family Communication: no family at bedside  Status is: Inpatient Remains inpatient appropriate because: placement   Level of care: Telemetry Medical  Consultants:  Nephrology   Objective: Vitals:   07/03/23 0243 07/03/23 0810 07/03/23 0906 07/03/23 1002  BP: (!) 121/49 109/62    Pulse: 70 72    Resp: 18 16 18 16   Temp: (!) 100.9 F (38.3 C) 99.1 F (37.3 C)    TempSrc:  Oral    SpO2:  99%    Weight:      Height:        Intake/Output Summary (Last 24 hours) at 07/03/2023 1104 Last data filed at 07/03/2023 0906 Gross per 24 hour  Intake 1440 ml  Output 900 ml  Net 540 ml   Wt Readings from Last 3 Encounters:  07/02/23 69.5 kg  06/16/23 74.4 kg  05/30/23 74.4 kg    Examination:  Constitutional: NAD Eyes: lids and conjunctivae normal, no scleral icterus ENMT: mmm Neck: normal, supple Respiratory: clear to auscultation bilaterally, no wheezing, no crackles. Normal respiratory effort.  Cardiovascular: Regular rate and rhythm, no murmurs / rubs / gallops. No LE edema. Abdomen: soft, no distention, no tenderness. Bowel sounds positive.   Data Reviewed: I have independently  reviewed following labs and imaging studies   CBC Recent Labs  Lab 06/29/23 0829 06/30/23 0545 07/01/23 0727 07/02/23 0800 07/03/23 0458  WBC 11.4* 10.2 10.3 11.3* 11.5*  HGB 7.2* 7.2* 7.4* 7.0* 7.6*  HCT 23.3* 22.8* 23.8* 22.9* 24.8*  PLT 188 194 220 222 234  MCV 74.9* 75.2* 74.4* 74.6* 74.7*  MCH 23.2* 23.8* 23.1* 22.8* 22.9*  MCHC 30.9 31.6 31.1 30.6 30.6  RDW 23.9* 24.0* 24.0* 25.0* 24.7*  LYMPHSABS  --   --  1.4 1.3  --   MONOABS  --   --  1.2* 1.3*  --   EOSABS  --   --  1.1* 1.2*  --   BASOSABS  --   --  0.1 0.1  --     Recent Labs  Lab 06/28/23 0826 06/29/23 0829 06/30/23 0545 07/01/23 0727 07/02/23 0800 07/03/23 0458  NA 138 141 138 139 142 140  K 3.8 4.5 4.5 4.9 5.4* 5.3*  CL 104 107 100 102 106 105  CO2 21* 21* 25 24 20* 21*  GLUCOSE 120* 80 87 107* 93 92  BUN 58* 81* 58* 85* 111* 78*  CREATININE 8.63* 10.31* 7.24* 8.96* 10.38* 7.57*  CALCIUM  8.9 9.1 8.9 9.1 9.5 9.3  AST 15 43* 32 70*  --   --   ALT 11 30 31  63*  --   --   ALKPHOS 52 54 52 66  --   --   BILITOT 0.8 0.8 0.7 0.6  --   --   ALBUMIN  2.3* 2.1* 2.1* 2.1* 2.1* 2.1*  MG  --   --  2.1 2.3  --   --     ------------------------------------------------------------------------------------------------------------------ No results for input(s): "CHOL", "HDL", "LDLCALC", "TRIG", "CHOLHDL", "LDLDIRECT" in the last 72 hours.  Lab Results  Component Value Date   HGBA1C 5.0 05/05/2023   ------------------------------------------------------------------------------------------------------------------ No results for input(s): "TSH", "T4TOTAL", "T3FREE", "THYROIDAB" in the last 72 hours.  Invalid input(s): "FREET3"  Cardiac Enzymes No results for input(s): "CKMB", "TROPONINI", "MYOGLOBIN" in the last 168 hours.  Invalid input(s): "CK" ------------------------------------------------------------------------------------------------------------------    Component Value Date/Time   BNP 1,416.0 (H)  11/21/2021 1117    CBG: Recent Labs  Lab 06/30/23 2150 07/01/23 0519 07/02/23 1146 07/02/23 1729 07/02/23 2043  GLUCAP 116* 115* 84  116* 75    Recent Results (from the past 240 hours)  Surgical pcr screen     Status: None   Collection Time: 06/24/23 12:03 PM   Specimen: Nasal Mucosa; Nasal Swab  Result Value Ref Range Status   MRSA, PCR NEGATIVE NEGATIVE Final   Staphylococcus aureus NEGATIVE NEGATIVE Final    Comment: (NOTE) The Xpert SA Assay (FDA approved for NASAL specimens in patients 52 years of age and older), is one component of a comprehensive surveillance program. It is not intended to diagnose infection nor to guide or monitor treatment. Performed at [facility], [address] [place], [place] [address]   Culture, blood (Routine X 2) w Reflex to ID Panel     Status: None   Collection Time: 06/25/23  9:10 AM   Specimen: BLOOD RIGHT ARM  Result Value Ref Range Status   Specimen Description BLOOD RIGHT ARM  Final   Special Requests   Final    BOTTLES DRAWN AEROBIC AND ANAEROBIC Blood Culture adequate volume   Culture   Final    NO GROWTH 5 DAYS Performed at [facility], [address] [place], [place] [address]    Report Status 06/30/2023 FINAL  Final  Culture, blood (Routine X 2) w Reflex to ID Panel     Status: None   Collection Time: 06/25/23  9:11 AM   Specimen: BLOOD RIGHT ARM  Result Value Ref Range Status   Specimen Description BLOOD RIGHT ARM  Final   Special Requests   Final    BOTTLES DRAWN AEROBIC AND ANAEROBIC Blood Culture adequate volume   Culture   Final    NO GROWTH 5 DAYS Performed at [facility], [address], [place], [place] [address]    Report Status 06/30/2023 FINAL  Final     Radiology Studies: No results found.   Kathlen Para, MD, PhD [facility]  Between 7 am - 7 pm I am available, please contact me via Amion (for emergencies) or Securechat (non urgent messages)  Between 7 pm - 7 am I  am not available, please contact night coverage MD/APP via Amion

## 2023-07-04 ENCOUNTER — Encounter (HOSPITAL_COMMUNITY): Payer: Self-pay | Admitting: Orthopedic Surgery

## 2023-07-04 DIAGNOSIS — R627 Adult failure to thrive: Secondary | ICD-10-CM | POA: Diagnosis not present

## 2023-07-04 LAB — COMPREHENSIVE METABOLIC PANEL WITH GFR
ALT: 54 U/L — ABNORMAL HIGH (ref 0–44)
AST: 25 U/L (ref 15–41)
Albumin: 2.2 g/dL — ABNORMAL LOW (ref 3.5–5.0)
Alkaline Phosphatase: 65 U/L (ref 38–126)
Anion gap: 14 (ref 5–15)
BUN: 105 mg/dL — ABNORMAL HIGH (ref 6–20)
CO2: 19 mmol/L — ABNORMAL LOW (ref 22–32)
Calcium: 9.4 mg/dL (ref 8.9–10.3)
Chloride: 108 mmol/L (ref 98–111)
Creatinine, Ser: 9.54 mg/dL — ABNORMAL HIGH (ref 0.61–1.24)
GFR, Estimated: 6 mL/min — ABNORMAL LOW (ref >60)
Glucose, Bld: 105 mg/dL — ABNORMAL HIGH (ref 70–99)
Potassium: 6.1 mmol/L — ABNORMAL HIGH (ref 3.5–5.1)
Sodium: 141 mmol/L (ref 135–145)
Total Bilirubin: 0.4 mg/dL (ref 0.0–1.2)
Total Protein: 6 g/dL — ABNORMAL LOW (ref 6.5–8.1)

## 2023-07-04 LAB — CBC
HCT: 26.1 % — ABNORMAL LOW (ref 39.0–52.0)
Hemoglobin: 7.8 g/dL — ABNORMAL LOW (ref 13.0–17.0)
MCH: 23.2 pg — ABNORMAL LOW (ref 26.0–34.0)
MCHC: 29.9 g/dL — ABNORMAL LOW (ref 30.0–36.0)
MCV: 77.7 fL — ABNORMAL LOW (ref 80.0–100.0)
Platelets: 231 10*3/uL (ref 150–400)
RBC: 3.36 MIL/uL — ABNORMAL LOW (ref 4.22–5.81)
RDW: 24.3 % — ABNORMAL HIGH (ref 11.5–15.5)
WBC: 11.3 10*3/uL — ABNORMAL HIGH (ref 4.0–10.5)
nRBC: 0 % (ref 0.0–0.2)

## 2023-07-04 LAB — GLUCOSE, CAPILLARY: Glucose-Capillary: 106 mg/dL — ABNORMAL HIGH (ref 70–99)

## 2023-07-04 LAB — MAGNESIUM: Magnesium: 2.7 mg/dL — ABNORMAL HIGH (ref 1.7–2.4)

## 2023-07-04 MED ORDER — SODIUM ZIRCONIUM CYCLOSILICATE 10 G PO PACK
10.0000 g | PACK | Freq: Once | ORAL | Status: AC
  Administered 2023-07-04: 10 g via ORAL
  Filled 2023-07-04: qty 1

## 2023-07-04 MED ORDER — OXYCODONE HCL 5 MG PO TABS
10.0000 mg | ORAL_TABLET | ORAL | Status: DC | PRN
  Administered 2023-07-05 (×2): 10 mg via ORAL
  Filled 2023-07-04 (×2): qty 2

## 2023-07-04 NOTE — Progress Notes (Signed)
 Physical Therapy Treatment Patient Details Name: [name]. MRN: [id number] DOB: 1970-07-24 Today's Date: 07/04/2023   History of Present Illness Pt is a 53 yr old male presenting 4/30 from PCP with hypoglycemia and general weakness. Pt also with limited PO intake prior to admission. 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 Additionally, pt noted to be + for Cdiff.  PMHx: ESRD on HD TTS, R AKA (Dr. Julio Ohm 2/25), CVA, atrial fibrillation not on anticoagulation, DM2, HTN, and seizure disorder.    PT Comments  Pt received in bed, states that today is not a good day, but is agreeable to getting OOB to chair. Discussed the need for tolerating sitting up in a chair for outpt HD as well as the longer term goal of being to scoot self into chair/ w/c with UE's. Pt assisted with rolling right and left into lift pad. Tolerated lift from bed to recliner. Pt dizzy with initial sitting in chair, encouraged pt to remain up an hour. PT will continue to follow.     If plan is discharge home, recommend the following: Two people to help with walking and/or transfers;Help with stairs or ramp for entrance;Assist for transportation;Assistance with cooking/housework;Supervision due to cognitive status   Can travel by private vehicle     No  Equipment Recommendations  Other (comment) (TBD)    Recommendations for Other Services Rehab consult     Precautions / Restrictions Precautions Precautions: Fall Recall of Precautions/Restrictions: Intact Precaution/Restrictions Comments: bilateral AKA Restrictions Weight Bearing Restrictions Per Provider Order: Yes RLE Weight Bearing Per Provider Order: Non weight bearing LLE Weight Bearing Per Provider Order: Non weight bearing     Mobility  Bed Mobility Overal bed mobility: Needs Assistance Bed Mobility: Rolling Rolling: Mod assist, Used rails, +2 for physical assistance         General bed mobility comments: rolled R and L for  positioning of lift pad under pt. Pt also assist with pulling up into sitting for positioning of pad behind pt    Transfers Overall transfer level: Needs assistance Equipment used: Ambulation equipment used Transfers: Bed to chair/wheelchair/BSC Sit to Stand: Via lift equipment           General transfer comment: pt lifted from bed to recliner. Had some discomfort LLE and buttocks but tolerated well overall Transfer via Lift Equipment: Maximove  Ambulation/Gait               General Gait Details: unable   Optometrist     Tilt Bed    Modified Rankin (Stroke Patients Only)       Balance Overall balance assessment: Needs assistance Sitting-balance support: Bilateral upper extremity supported Sitting balance-Leahy Scale: Poor Sitting balance - Comments: edge of chair, needed mod A                                    Communication Communication Communication: No apparent difficulties  Cognition Arousal: Alert Behavior During Therapy: WFL for tasks assessed/performed   PT - Cognitive impairments: No family/caregiver present to determine baseline                       PT - Cognition Comments: education given on use of lift to increase tolerance for sitting in chair but ultimate goal of pt scooting self  to chair. Pt able to verbalize understanding of this Following commands: Intact Following commands impaired: Follows one step commands with increased time    Cueing Cueing Techniques: Verbal cues  Exercises Amputee Exercises Hip ABduction/ADduction: AROM, Both, 5 reps, Supine Hip Flexion/Marching: AROM, Both, 10 reps, Supine Straight Leg Raises: AROM    General Comments General comments (skin integrity, edema, etc.): Pt dizzy in chair, BP 127/71      Pertinent Vitals/Pain Pain Assessment Pain Assessment: Faces Faces Pain Scale: Hurts even more Pain Location: L LE and sacral wound Pain Descriptors  / Indicators: Discomfort, Sore Pain Intervention(s): Limited activity within patient's tolerance, Monitored during session, Repositioned    Home Living                          Prior Function            PT Goals (current goals can now be found in the care plan section) Acute Rehab PT Goals Patient Stated Goal: Improve strength, independence and hopefully walk with prosthetic PT Goal Formulation: With patient Time For Goal Achievement: 07/07/23 Potential to Achieve Goals: Fair Progress towards PT goals: Progressing toward goals    Frequency    Min 2X/week      PT Plan      Co-evaluation              AM-PAC PT "6 Clicks" Mobility   Outcome Measure  Help needed turning from your back to your side while in a flat bed without using bedrails?: A Lot Help needed moving from lying on your back to sitting on the side of a flat bed without using bedrails?: A Lot Help needed moving to and from a bed to a chair (including a wheelchair)?: Total Help needed standing up from a chair using your arms (e.g., wheelchair or bedside chair)?: Total Help needed to walk in hospital room?: Total Help needed climbing 3-5 steps with a railing? : Total 6 Click Score: 8    End of Session Equipment Utilized During Treatment: Other (comment) (lift pad) Activity Tolerance: Patient tolerated treatment well Patient left: with call bell/phone within reach;in chair;with chair alarm set Nurse Communication: Mobility status PT Visit Diagnosis: Other abnormalities of gait and mobility (R26.89);Pain;Muscle weakness (generalized) (M62.81);Adult, failure to thrive (R62.7) Pain - Right/Left: Left Pain - part of body: Leg     Time: 1610-9604 PT Time Calculation (min) (ACUTE ONLY): 35 min  Charges:    $Therapeutic Activity: 23-37 mins PT General Charges $$ ACUTE PT VISIT: 1 Visit                     Amey Ka, PT  Acute Rehab Services Secure chat preferred Office  [phone]    Deloris Fetters Adonis Ryther 07/04/2023, 11:46 AM

## 2023-07-04 NOTE — Progress Notes (Signed)
 [facility] Progress Note   Subjective:    Seen in room. Staff got him into the chair this morning. No reported events or issues. Last HD 07/02/23.   Objective Vitals:   07/03/23 1700 07/03/23 2014 07/04/23 0417 07/04/23 0755  BP: 118/62 137/63 (!) 107/21 (!) 111/55  Pulse: 67 73 68 65  Resp:  16 14 18   Temp: 98.7 F (37.1 C) 98.9 F (37.2 C) 98.7 F (37.1 C) 98.8 F (37.1 C)  TempSrc: Oral     SpO2: 99% 100% 98% 94%  Weight:      Height:       Physical Exam General: Alert, oriented, nad Heart: RRR Lungs: Clear, normal wob Abdomen: Soft, non-distended Extremities: R AKA, L stump w/wound vac  Dialysis Access: LUE AVF + t/b  Filed Weights   06/29/23 1215 07/02/23 0753 07/02/23 1113  Weight: 69.9 kg 70.2 kg 69.5 kg    Intake/Output Summary (Last 24 hours) at 07/04/2023 1223 Last data filed at 07/04/2023 0840 Gross per 24 hour  Intake 1035.33 ml  Output --  Net 1035.33 ml    Additional Objective Labs: Basic Metabolic Panel: Recent Labs  Lab 07/01/23 0727 07/02/23 0800 07/03/23 0458 07/04/23 0907  NA 139 142 140 141  K 4.9 5.4* 5.3* 6.1*  CL 102 106 105 108  CO2 24 20* 21* 19*  GLUCOSE 107* 93 92 105*  BUN 85* 111* 78* 105*  CREATININE 8.96* 10.38* 7.57* 9.54*  CALCIUM  9.1 9.5 9.3 9.4  PHOS 6.1* 7.1* 5.7*  --    Liver Function Tests: Recent Labs  Lab 06/30/23 0545 07/01/23 0727 07/02/23 0800 07/03/23 0458 07/04/23 0907  AST 32 70*  --   --  25  ALT 31 63*  --   --  54*  ALKPHOS 52 66  --   --  65  BILITOT 0.7 0.6  --   --  0.4  PROT 5.7* 5.9*  --   --  6.0*  ALBUMIN  2.1* 2.1* 2.1* 2.1* 2.2*   No results for input(s): "LIPASE", "AMYLASE" in the last 168 hours. CBC: Recent Labs  Lab 06/30/23 0545 07/01/23 0727 07/02/23 0800 07/03/23 0458 07/04/23 0907  WBC 10.2 10.3 11.3* 11.5* 11.3*  NEUTROABS  --  6.6 7.4  --   --   HGB 7.2* 7.4* 7.0* 7.6* 7.8*  HCT 22.8* 23.8* 22.9* 24.8* 26.1*  MCV 75.2* 74.4* 74.6* 74.7* 77.7*  PLT  194 220 222 234 231   Blood Culture    Component Value Date/Time   SDES BLOOD RIGHT ARM 06/25/2023 0911   SPECREQUEST  06/25/2023 0911    BOTTLES DRAWN AEROBIC AND ANAEROBIC Blood Culture adequate volume   CULT  06/25/2023 0911    NO GROWTH 5 DAYS Performed at [facility] Lab, [address], [place], [place] [address]    REPTSTATUS 06/30/2023 FINAL 06/25/2023 0911     CBG: Recent Labs  Lab 07/02/23 1146 07/02/23 1729 07/02/23 2043 07/03/23 2110 07/04/23 1155  GLUCAP 84 116* 75 120* 106*   Iron Studies:  Recent Labs    07/03/23 0458  IRON 11*  TIBC 158*  FERRITIN 1,569*   Lab Results  Component Value Date   INR 1.2 05/06/2023   INR 1.2 05/05/2023   INR 1.1 03/01/2022   Studies/Results: No results found.  Medications:  sodium chloride  10 mL/hr at 06/24/23 2003    acidophilus  2 capsule Oral TID   amiodarone   200 mg Oral Daily   vitamin C   1,000 mg Oral Daily   aspirin  EC  81 mg Oral Q breakfast   atorvastatin   80 mg Oral QHS   calcium  acetate  1,334 mg Oral TID WC   carvedilol   25 mg Oral BID   Chlorhexidine  Gluconate Cloth  6 each Topical Q0600   darbepoetin (ARANESP ) injection - NON-DIALYSIS  100 mcg Subcutaneous Q Thu-1800   feeding supplement (NEPRO CARB STEADY)  237 mL Oral TID BM   heparin   5,000 Units Subcutaneous Q8H   levETIRAcetam   500 mg Oral Once per day on Sunday Tuesday Thursday Saturday   levETIRAcetam   750 mg Oral Once per day on Monday Wednesday Friday   liver oil-zinc  oxide   Topical BID   multivitamin  1 tablet Oral QHS   nutrition supplement (JUVEN)  1 packet Oral BID BM   pregabalin   50 mg Oral Daily   sodium chloride  flush  3 mL Intravenous Q12H   zinc  sulfate (50mg  elemental zinc )  220 mg Oral Daily    Dialysis Orders: [facility] TTS 3 hr  edw 77kg,  LUE AVF   Heparin  1000 bolus + 400u/hr Mircera 100 mcg  q 2wks due  4/28 ( was no show)  No vitamin D   Assessment/Plan: Infected wound L foot: hx of R AKA  for similar issues. --- S/p L AKA on 5/2  ESRD: on HD TTS.  Next HD 07/05/23 HTN/Vol: Soft BPs. Stable. No volume on exam. Minimal UF with HD.  Anemia:  S/p 1 unit PRBCs 5/6. Transfuse prn.  On Aranesp  100 q Thus -last on 5/8. Check Fe studies, now off IV abx.   Secondary hyperparathyroidism: CorrCa elevated -not on Vit D. Phos above goal on Ca acetate binder --follow trends.  Nutrition: optimize as able. Prot supp per RD.  Hyperkalemia: K 6.1 today. One dose of Lokelma  10 mg ordered today.  Seizure d/o - on keppra  DM2 - per pmd Diarrhea - +C. Diff and norovirus on admit. - s/p po vanc  FTT - poor po intake, sacral wound, recs for SNF    Hersey Lorenzo, NP [facility] 07/04/2023,12:23 PM

## 2023-07-04 NOTE — Progress Notes (Signed)
 Mobility Specialist Progress Note:    07/04/23 1151  Mobility  Activity Transferred from chair to bed  Level of Assistance +2 (takes two people)  Assistive Device MaxiMove  RLE Weight Bearing Per Provider Order NWB  LLE Weight Bearing Per Provider Order NWB  Activity Response Tolerated well  Mobility Referral Yes  Mobility visit 1 Mobility  Mobility Specialist Start Time (ACUTE ONLY) 1127  Mobility Specialist Stop Time (ACUTE ONLY) 1142  Mobility Specialist Time Calculation (min) (ACUTE ONLY) 15 min   Pt received in chair, requesting assistance back to bed. C/o BLE and sacral pain throughout. Transferred via maximove w/o fault. Pt left in bed w/ NT present.  D'Vante Nolon Baxter Mobility Specialist Please contact via Special educational needs teacher or Rehab office at [phone]

## 2023-07-04 NOTE — Progress Notes (Signed)
 PROGRESS NOTE  [name]. 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  He was found to have an infected ischemic wound of the left foot, eventually underwent left AKA on 5/2  Subjective / 24h Interval events: Has a cough this morning, tells me he has been coughing for the past 8 weeks or so, nonproductive  Assesement and Plan: Active Problems:   C. difficile colitis   Syncope   Hypoglycemia   Ischemic ulcer of left heel (HCC)   Failure to thrive in adult   Gangrene of left foot (HCC)   Malnutrition of moderate degree  Principal problem Syncopal episode, hypoglycemia -slumped over and unconscious in PCPs office.  This is likely due to poor p.o. intake - Encourage p.o. intake, no hypoglycemia since 5/8 - Cortisol adequate at 11.1 at around 10 AM, no adrenal insufficiency.  TSH unremarkable  Active problems Infected ischemic ulcer of the left heel - History of gangrenous wounds on the right and has ultimately had a right AKA.  On admission he was found to have an infected ulcer of the left heel, orthopedic surgery consulted, he is now status post left AKA on 5/2.  Initially he was maintained on broad-spectrum antibiotics, now discontinued followed by amputation.  Wound VAC removed 5/8  Diarrhea -positive for C. difficile and norovirus on admission.  Has completed a course of p.o. vancomycin .  Required rectal tube initially, now removed on 5/4 - Improving  Fever-100.9 on 5/10, no fever since.   Failure to thrive - Progressive weakness now wheelchair-bound and using a [name]; despite recent SNF stay. diminished  oral intake only taking 1 meal per day. - Nutrition consulted - PT/OT consulted, recs for SNF, placement pending, unfortunately insurance denied - Awaiting other avenues for placing, appeal is in process   Troponin elevation - No history of chest pain. High - sensitivity troponin low level elevated and flat 28 -> 30 which is consistent with demand ischemia, also underlying ESRD.  - Continue aspirin , statin, Coreg    Sacral wound - WOC consulted per above   ESRD,Tu/Th/Sat HD -Nephroogy consulted for HD   CAD history STEMI / Hx CVA - Continue home aspirin , atorvastatin  80 mg,  Coreg  12.5 mg twice daily   HFpEF -  Without acute exacerbation.  Not on diuretics   Paroxysmal A-fib off anticoagulation - currently in sinus rhythm, not on anticoagulation.  Continue home amiodarone , coreg  per above.    Hx IPH - Noted, off AC.    Seizure disorder - Continue home Keppra  500 mg nondialysis days, 750 mg on HD days.   DM type II diet controlled - Recent hypoglycemia, not on diabetic medications.   CBG (last 3)  Recent Labs    07/02/23 1729 07/02/23 2043 07/03/23 2110  GLUCAP 116* 75 120*     Hypertension - continue home antihypertensives, pressure stable and acceptable  Chronic anemia - Hx anemia CKD, received 2 units of packed red blood cells.  Hemoglobin 7.6 and overall stable   Enucleation OS, hx DM retinopathy, glaucoma - Noted, ophtho f/u OP.   Scheduled Meds:  acidophilus  2 capsule Oral TID   amiodarone   200 mg Oral Daily   vitamin C   1,000 mg Oral Daily   aspirin  EC  81 mg Oral Q breakfast   atorvastatin   80 mg Oral QHS   calcium  acetate  1,334 mg Oral TID WC   carvedilol   25 mg Oral BID   Chlorhexidine  Gluconate Cloth  6 each Topical Q0600   darbepoetin (ARANESP ) injection - NON-DIALYSIS  100 mcg Subcutaneous Q Thu-1800   feeding supplement (NEPRO CARB STEADY)  237 mL Oral TID BM   heparin   5,000 Units Subcutaneous Q8H   levETIRAcetam   500 mg Oral Once per day on Sunday  Tuesday Thursday Saturday   levETIRAcetam  750 mg Oral Once per day on Monday Wednesday Friday   liver oil-zinc oxide   Topical BID   multivitamin  1 tablet Oral QHS   nutrition supplement (JUVEN)  1 packet Oral BID BM   pregabalin  50 mg Oral Daily   sodium chloride flush  3 mL Intravenous Q12H   zinc sulfate (50mg elemental zinc)  220 mg Oral Daily   Continuous Infusions:  sodium chloride 10 mL/hr at 06/24/23 2003   PRN Meds:.acetaminophen, hydrALAZINE, melatonin, ondansetron (ZOFRAN) IV, oxyCODONE, polyethylene glycol  Current Outpatient Medications  Medication Instructions   acetaminophen (TYLENOL) 650 mg, Oral, Every 6 hours PRN   Amino Acids-Protein Hydrolys (PRO-STAT AWC) LIQD 30 mLs, Every morning   amiodarone (PACERONE) 200 mg, Oral, Daily   ammonium lactate (AMLACTIN DAILY) 12 % lotion Apply topically.   ascorbic acid (VITAMIN C) 1,000 mg, Daily   aspirin EC 81 mg, Oral, Daily with breakfast, Swallow whole.   atorvastatin (LIPITOR) 80 mg, Oral, Daily at bedtime   brimonidine (ALPHAGAN) 0.2 % ophthalmic solution 1 drop, Right Eye, 3 times daily   calcitRIOL (ROCALTROL) 2.25 mcg, Oral, Every T-Th-Sa (1800)   calcium acetate (PHOSLO) 1,334 mg, 3 times daily   carvedilol (COREG) 12.5 mg, Oral, 2 times daily   cinacalcet (SENSIPAR) 60 mg, Oral, Every T-Th-Sa (1800)   dorzolamide-timolol (COSOPT) 2-0.5 % ophthalmic solution 1 drop, 2 times daily   levETIRAcetam (KEPPRA) 500 mg, Oral, 4 times weekly, On Sunday, Monday, Wednesday and Friday, all non-dialysis days   levETIRAcetam (KEPPRA) 750 mg, Oral, 3 times weekly, On Tuesday Thursday and Saturday, on dialysis days   lidocaine-prilocaine (EMLA) cream 1 Application, 3 times weekly   loperamide (IMODIUM) 2 mg, Oral, As needed   multivitamin (RENA-VIT) TABS tablet 1 tablet, Oral, Daily at bedtime   Nutritional Supplements (,FEEDING SUPPLEMENT, PROSOURCE PLUS) liquid 30 mLs, Oral, 3 times daily between meals   ondansetron (ZOFRAN)  4 mg, Oral, Every 6 hours PRN   oxyCODONE (OXY IR/ROXICODONE) 5 mg, Oral, Every 4 hours PRN   polyethylene glycol (MIRALAX / GLYCOLAX) 17 g, Oral, Daily   Zinc Oxide (DESITIN MAXIMUM STRENGTH) 40 % PSTE 1 Application, Apply externally, 2 times daily PRN    Diet Orders (From admission, onward)     Start     Ordered   06/30/23 1518  Diet regular Room service appropriate? Yes; Fluid consistency: Thin; Fluid restriction: 1200 mL Fluid  Diet effective now       Question Answer Comment  Room service appropriate? Yes   Fluid consistency: Thin   Fluid restriction: 1200 mL Fluid      05 /08/25 1518            DVT prophylaxis: heparin  injection 5,000 Units Start: 06/21/23 0600   Lab Results  Component Value Date   PLT 234 07/03/2023  Code Status: Limited: Do not attempt resuscitation (DNR) -DNR-LIMITED -Do Not Intubate/DNI   Family Communication: no family at bedside   Status is: Inpatient Remains inpatient appropriate because: placement   Level of care: Telemetry Medical  Consultants:  Nephrology   Objective: Vitals:   07/03/23 1700 07/03/23 2014 07/04/23 0417 07/04/23 0755  BP: 118/62 137/63 (!) 107/21 (!) 111/55  Pulse: 67 73 68 65  Resp:  16 14 18   Temp: 98.7 F (37.1 C) 98.9 F (37.2 C) 98.7 F (37.1 C) 98.8 F (37.1 C)  TempSrc: Oral     SpO2: 99% 100% 98% 94%  Weight:      Height:        Intake/Output Summary (Last 24 hours) at 07/04/2023 0908 Last data filed at 07/04/2023 0840 Gross per 24 hour  Intake 1035.33 ml  Output --  Net 1035.33 ml   Wt Readings from Last 3 Encounters:  07/02/23 69.5 kg  06/16/23 74.4 kg  05/30/23 74.4 kg    Examination:  Constitutional: NAD Eyes: lids and conjunctivae normal, no scleral icterus ENMT: mmm Neck: normal, supple Respiratory: clear to auscultation bilaterally, no wheezing, no crackles. Normal respiratory effort.  Cardiovascular: Regular rate and rhythm, no murmurs / rubs / gallops. No LE  edema. Abdomen: soft, no distention, no tenderness. Bowel sounds positive.   Data Reviewed: I have independently reviewed following labs and imaging studies   CBC Recent Labs  Lab 06/29/23 0829 06/30/23 0545 07/01/23 0727 07/02/23 0800 07/03/23 0458  WBC 11.4* 10.2 10.3 11.3* 11.5*  HGB 7.2* 7.2* 7.4* 7.0* 7.6*  HCT 23.3* 22.8* 23.8* 22.9* 24.8*  PLT 188 194 220 222 234  MCV 74.9* 75.2* 74.4* 74.6* 74.7*  MCH 23.2* 23.8* 23.1* 22.8* 22.9*  MCHC 30.9 31.6 31.1 30.6 30.6  RDW 23.9* 24.0* 24.0* 25.0* 24.7*  LYMPHSABS  --   --  1.4 1.3  --   MONOABS  --   --  1.2* 1.3*  --   EOSABS  --   --  1.1* 1.2*  --   BASOSABS  --   --  0.1 0.1  --     Recent Labs  Lab 06/28/23 0826 06/29/23 0829 06/30/23 0545 07/01/23 0727 07/02/23 0800 07/03/23 0458  NA 138 141 138 139 142 140  K 3.8 4.5 4.5 4.9 5.4* 5.3*  CL 104 107 100 102 106 105  CO2 21* 21* 25 24 20* 21*  GLUCOSE 120* 80 87 107* 93 92  BUN 58* 81* 58* 85* 111* 78*  CREATININE 8.63* 10.31* 7.24* 8.96* 10.38* 7.57*  CALCIUM  8.9 9.1 8.9 9.1 9.5 9.3  AST 15 43* 32 70*  --   --   ALT 11 30 31  63*  --   --   ALKPHOS 52 54 52 66  --   --   BILITOT 0.8 0.8 0.7 0.6  --   --   ALBUMIN  2.3* 2.1* 2.1* 2.1* 2.1* 2.1*  MG  --   --  2.1 2.3  --   --     ------------------------------------------------------------------------------------------------------------------ No results for input(s): "CHOL", "HDL", "LDLCALC", "TRIG", "CHOLHDL", "LDLDIRECT" in the last 72 hours.  Lab Results  Component Value Date   HGBA1C 5.0 05/05/2023   ------------------------------------------------------------------------------------------------------------------ No results for input(s): "TSH", "T4TOTAL", "T3FREE", "THYROIDAB" in the last 72 hours.  Invalid input(s): "FREET3"  Cardiac Enzymes No results for input(s): "CKMB", "TROPONINI", "MYOGLOBIN" in the last 168 hours.  Invalid input(s):  "CK" ------------------------------------------------------------------------------------------------------------------    Component Value Date/Time   BNP  1,416.0 (H) 11/21/2021 1117    CBG: Recent Labs  Lab 07/01/23 0519 07/02/23 1146 07/02/23 1729 07/02/23 2043 07/03/23 2110  GLUCAP 115* 84 116* 75 120*    Recent Results (from the past 240 hours)  Surgical pcr screen     Status: None   Collection Time: 06/24/23 12:03 PM   Specimen: Nasal Mucosa; Nasal Swab  Result Value Ref Range Status   MRSA, PCR NEGATIVE NEGATIVE Final   Staphylococcus aureus NEGATIVE NEGATIVE Final    Comment: (NOTE) The Xpert SA Assay (FDA approved for NASAL specimens in patients 10 years of age and older), is one component of a comprehensive surveillance program. It is not intended to diagnose infection nor to guide or monitor treatment. Performed at [facility], [address] [place], [place] [address]   Culture, blood (Routine X 2) w Reflex to ID Panel     Status: None   Collection Time: 06/25/23  9:10 AM   Specimen: BLOOD RIGHT ARM  Result Value Ref Range Status   Specimen Description BLOOD RIGHT ARM  Final   Special Requests   Final    BOTTLES DRAWN AEROBIC AND ANAEROBIC Blood Culture adequate volume   Culture   Final    NO GROWTH 5 DAYS Performed at [facility], [address] [place], [place] [address]    Report Status 06/30/2023 FINAL  Final  Culture, blood (Routine X 2) w Reflex to ID Panel     Status: None   Collection Time: 06/25/23  9:11 AM   Specimen: BLOOD RIGHT ARM  Result Value Ref Range Status   Specimen Description BLOOD RIGHT ARM  Final   Special Requests   Final    BOTTLES DRAWN AEROBIC AND ANAEROBIC Blood Culture adequate volume   Culture   Final    NO GROWTH 5 DAYS Performed at [facility], [address], [place], [place] [address]    Report Status 06/30/2023 FINAL  Final     Radiology Studies: No results found.   Kathlen Para, MD,  PhD [facility]  Between 7 am - 7 pm I am available, please contact me via Amion (for emergencies) or Securechat (non urgent messages)  Between 7 pm - 7 am I am not available, please contact night coverage MD/APP via Amion

## 2023-07-04 NOTE — TOC Progression Note (Addendum)
 Transition of Care (TOC) - Progression Note    Patient Details  Name: [name]. MRN: [id number] Date of Birth: [birth year]  Transition of Care [facility]) CM/SW Contact  Arron Big, [place] Phone Number: 07/04/2023, 12:38 PM  Clinical Narrative:  Appeal has been overturned and [name] is approved for [facility]; Approval dates 07/03/2023-05/15/202.  CSW updated treatment team and facility.   TOC will continue to follow.    Expected Discharge Plan: Skilled Nursing Facility Barriers to Discharge: Continued Medical Work up  Expected Discharge Plan and Services In-house Referral: Clinical Social Work   Post Acute Care Choice: Skilled Nursing Facility Living arrangements for the past 2 months: Single Family Home                                       Social Determinants of Health (SDOH) Interventions SDOH Screenings   Food Insecurity: No Food Insecurity (06/21/2023)  Housing: High Risk (05/22/2023)  Transportation Needs: No Transportation Needs (05/22/2023)  Utilities: Patient Declined (06/21/2023)  Financial Resource Strain: High Risk (07/05/2017)  Physical Activity: Inactive (07/05/2017)  Social Connections: Socially Integrated (05/05/2023)  Stress: Stress Concern Present (07/05/2017)  Tobacco Use: Low Risk  (06/29/2023)  Health Literacy: Low Risk  (06/01/2020)   Received from [facility], [facility]    Readmission Risk Interventions    06/25/2023    9:22 AM 06/21/2023   10:51 AM 05/17/2023    3:44 PM  Readmission Risk Prevention Plan  Transportation Screening Complete Complete Complete  Medication Review (RN Care Manager) Complete Complete Referral to Pharmacy  PCP or Specialist appointment within 3-5 days of discharge Complete  Complete  HRI or Home Care Consult Complete Complete Complete  SW Recovery Care/Counseling Consult Complete Complete Complete  Palliative Care Screening Not Applicable Not Applicable Not Applicable  Skilled Nursing Facility  Complete Not Applicable Not Applicable

## 2023-07-05 DIAGNOSIS — R627 Adult failure to thrive: Secondary | ICD-10-CM | POA: Diagnosis not present

## 2023-07-05 LAB — CBC
HCT: 23.5 % — ABNORMAL LOW (ref 39.0–52.0)
Hemoglobin: 7.2 g/dL — ABNORMAL LOW (ref 13.0–17.0)
MCH: 22.9 pg — ABNORMAL LOW (ref 26.0–34.0)
MCHC: 30.6 g/dL (ref 30.0–36.0)
MCV: 74.8 fL — ABNORMAL LOW (ref 80.0–100.0)
Platelets: 240 10*3/uL (ref 150–400)
RBC: 3.14 MIL/uL — ABNORMAL LOW (ref 4.22–5.81)
RDW: 24.5 % — ABNORMAL HIGH (ref 11.5–15.5)
WBC: 10.9 10*3/uL — ABNORMAL HIGH (ref 4.0–10.5)
nRBC: 0 % (ref 0.0–0.2)

## 2023-07-05 LAB — BASIC METABOLIC PANEL WITH GFR
Anion gap: 15 (ref 5–15)
BUN: 132 mg/dL — ABNORMAL HIGH (ref 6–20)
CO2: 19 mmol/L — ABNORMAL LOW (ref 22–32)
Calcium: 9.3 mg/dL (ref 8.9–10.3)
Chloride: 108 mmol/L (ref 98–111)
Creatinine, Ser: 10.72 mg/dL — ABNORMAL HIGH (ref 0.61–1.24)
GFR, Estimated: 5 mL/min — ABNORMAL LOW (ref >60)
Glucose, Bld: 90 mg/dL (ref 70–99)
Potassium: 7.3 mmol/L (ref 3.5–5.1)
Sodium: 142 mmol/L (ref 135–145)

## 2023-07-05 LAB — GLUCOSE, CAPILLARY
Glucose-Capillary: 73 mg/dL (ref 70–99)
Glucose-Capillary: 78 mg/dL (ref 70–99)

## 2023-07-05 MED ORDER — HEPARIN SODIUM (PORCINE) 1000 UNIT/ML IJ SOLN
3000.0000 [IU] | Freq: Once | INTRAMUSCULAR | Status: AC
  Administered 2023-07-06: 3000 [IU] via INTRAVENOUS

## 2023-07-05 MED ORDER — ALTEPLASE 2 MG IJ SOLR: 2.0000 mg | Freq: Once | INTRAMUSCULAR | Status: DC | PRN

## 2023-07-05 MED ORDER — HEPARIN SODIUM (PORCINE) 1000 UNIT/ML DIALYSIS: 1000.0000 [IU] | INTRAMUSCULAR | Status: DC | PRN

## 2023-07-05 MED ORDER — LIDOCAINE-PRILOCAINE 2.5-2.5 % EX CREA: 1.0000 | TOPICAL_CREAM | CUTANEOUS | Status: DC | PRN

## 2023-07-05 MED ORDER — PENTAFLUOROPROP-TETRAFLUOROETH EX AERO: 1.0000 | INHALATION_SPRAY | CUTANEOUS | Status: DC | PRN

## 2023-07-05 MED ORDER — LIDOCAINE HCL (PF) 1 % IJ SOLN: 5.0000 mL | INTRAMUSCULAR | Status: DC | PRN

## 2023-07-05 MED ORDER — HEPARIN SODIUM (PORCINE) 1000 UNIT/ML DIALYSIS: 400.0000 [IU]/h | INTRAMUSCULAR | Status: DC | PRN

## 2023-07-05 MED ORDER — SODIUM ZIRCONIUM CYCLOSILICATE 10 G PO PACK
10.0000 g | PACK | Freq: Three times a day (TID) | ORAL | Status: DC
  Administered 2023-07-05 – 2023-07-08 (×7): 10 g via ORAL
  Filled 2023-07-05 (×8): qty 1

## 2023-07-05 MED ORDER — HYDROMORPHONE HCL 1 MG/ML IJ SOLN: 1.0000 mg | Freq: Once | INTRAMUSCULAR | Status: DC | PRN

## 2023-07-05 NOTE — Consult Note (Signed)
 Contacted Dr. Julio Ohm, Prevena taken off 5/8, hospitalist request WOC because of VAC remnants in wound, difficult to remove. Discussed with Dr. Julio Ohm, he will see patient tomorrow 5/14 to evaluate.   Gregori Abril [facility], CNS, [employer] [phone]

## 2023-07-05 NOTE — Progress Notes (Signed)
 Occupational Therapy Treatment Patient Details Name: [name]. MRN: [id number] DOB: [birth year] Today's Date: 07/05/2023   History of present illness Pt is a 53 yr old male presenting 4/30 from PCP with hypoglycemia and general weakness. Pt also with limited PO intake prior to admission. 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 Additionally, pt noted to be + for Cdiff.  PMHx: ESRD on HD TTS, R AKA (Dr. Julio Ohm 2/25), CVA, atrial fibrillation not on anticoagulation, DM2, HTN, and seizure disorder.   OT comments  [name] pain to bottom, states it has been limiting OOB activities like sitting in recliner or sitting EOB, which Pt declined today. Pt assisted at bed level for toileting, able to roll L/R using bed rails mod A. Spoke with Pt about importance of activities activating core, performing upright activities like sitting EOB or rolling L/R. Pt declined sitting up due to sore on bottom. Attempted to complete BLE exercises per Pt request, but bottom hurting too much to complete. Assisted Pt with positioning in bed to relieve pressure off bottom, placed a pillow under each hip, Pt reports significant relief of pain. Will continue to see Pt acutely and try to premedicate with pain meds prior to session. DC to postacute intensive rehab still appropriate, hoping Pt remains motivated as he was last week to progress as pain is managed.       If plan is discharge home, recommend the following:  Two people to help with walking and/or transfers;A lot of help with bathing/dressing/bathroom;Assistance with cooking/housework;Assist for transportation;Help with stairs or ramp for entrance   Equipment Recommendations  Other (comment)    Recommendations for Other Services      Precautions / Restrictions Precautions Precautions: Fall Recall of Precautions/Restrictions: Intact Precaution/Restrictions Comments: bilateral AKA Restrictions Weight Bearing Restrictions Per Provider Order:  Yes RLE Weight Bearing Per Provider Order: Non weight bearing LLE Weight Bearing Per Provider Order: Non weight bearing       Mobility Bed Mobility Overal bed mobility: Needs Assistance Bed Mobility: Rolling Rolling: Mod assist, Used rails         General bed mobility comments: mod A rolling L/R using rails, declined sitting EOB.    Transfers Overall transfer level: Needs assistance                 General transfer comment: declined sitting EOB or trasnfer using maximove     Balance Overall balance assessment: Needs assistance     Sitting balance - Comments: declined                                   ADL either performed or assessed with clinical judgement   ADL Overall ADL's : Needs assistance/impaired                             Toileting- Clothing Manipulation and Hygiene: Bed level;Total assistance         General ADL Comments: bed level toileting hygiene total A. mod A rolling L/R using bed rails.    Extremity/Trunk Assessment Upper Extremity Assessment Upper Extremity Assessment: RUE deficits/detail;LUE deficits/detail RUE Deficits / Details: 4th digit amputation, difficulty with FM skills, needs assist with feeding. RUE Coordination: decreased fine motor LUE Deficits / Details: 3rd digit DIP amputation, limited shoulder flexion in supine LUE Coordination: decreased fine motor  Vision       Perception     Praxis     Communication Communication Communication: No apparent difficulties   Cognition Arousal: Alert Behavior During Therapy: Agitated, WFL for tasks assessed/performed Cognition: No apparent impairments             OT - Cognition Comments: Pt agitated at times today, stated he was in pain and did not appreciate being pushed or motivated to perform therapy activities                 Following commands: Intact Following commands impaired: Follows one step commands with increased  time      Cueing   Cueing Techniques: Verbal cues  Exercises      Shoulder Instructions       General Comments      Pertinent Vitals/ Pain       Pain Assessment Pain Assessment: Faces Faces Pain Scale: Hurts even more Pain Location: L LE and sacral wound Pain Descriptors / Indicators: Discomfort, Sore Pain Intervention(s): Monitored during session, Limited activity within patient's tolerance  Home Living                                          Prior Functioning/Environment              Frequency  Min 2X/week        Progress Toward Goals  OT Goals(current goals can now be found in the care plan section)  Progress towards OT goals: Progressing toward goals  Acute Rehab OT Goals Patient Stated Goal: to manage pain OT Goal Formulation: With patient Time For Goal Achievement: 07/07/23 Potential to Achieve Goals: Good ADL Goals Pt Will Perform Eating: with supervision;with adaptive utensils;sitting Pt Will Perform Grooming: with set-up;sitting Pt Will Perform Lower Body Bathing: with set-up;sitting/lateral leans Pt Will Perform Upper Body Dressing: with min assist;sitting Pt Will Perform Lower Body Dressing: bed level;with min assist Pt Will Transfer to Toilet: with +2 assist;with mod assist;bedside commode Pt/caregiver will Perform Home Exercise Program: Increased ROM;Increased strength;Both right and left upper extremity;With Supervision;With written HEP provided Additional ADL Goal #1: Pt will be able to tolerate at least 1 hour of sitting in recliner to maximize participation in ADLs and OOB activities.  Plan      Co-evaluation                 AM-PAC OT "6 Clicks" Daily Activity     Outcome Measure   Help from another person eating meals?: A Little Help from another person taking care of personal grooming?: A Little Help from another person toileting, which includes using toliet, bedpan, or urinal?: A Lot Help from another  person bathing (including washing, rinsing, drying)?: A Lot Help from another person to put on and taking off regular upper body clothing?: A Lot Help from another person to put on and taking off regular lower body clothing?: A Lot 6 Click Score: 14    End of Session    OT Visit Diagnosis: Unsteadiness on feet (R26.81);Muscle weakness (generalized) (M62.81)   Activity Tolerance Patient limited by pain   Patient Left in bed;with call bell/phone within reach;with bed alarm set   Nurse Communication Mobility status        Time: 5409-8119 OT Time Calculation (min): 24 min  Charges: OT General Charges $OT Visit: 1 Visit OT Treatments $Self Care/Home Management : 8-22  mins $Therapeutic Activity: 8-22 mins  [facility], OTR/L   Scherry Curtis 07/05/2023, 4:29 PM

## 2023-07-05 NOTE — Progress Notes (Signed)
 [facility] Progress Note   Subjective:    Seen on HD today. Patient was only able to get limited HD today (35 minutes) due to infiltration. Total UF was 400 cc. Patient stable and not hypervolemic. His K is 7.3 today. Will start aggressive K binder.   Objective Vitals:   07/05/23 1015 07/05/23 1030 07/05/23 1036 07/05/23 1039  BP: (!) 95/34 (!) 98/48 (!) 98/48 (!) 111/55  Pulse:      Resp:      Temp:    (!) 97.3 F (36.3 C)  TempSrc:    Axillary  SpO2:      Weight:      Height:       Physical Exam General: Alert, oriented, nad Heart: RRR Lungs: Clear, normal wob Abdomen: Soft, non-distended Extremities: R AKA, L stump w/wound vac  Dialysis Access: LUE AVF + t/b, infiltration today and has swelling above access.   Filed Weights   06/29/23 1215 07/02/23 0753 07/02/23 1113  Weight: 69.9 kg 70.2 kg 69.5 kg    Intake/Output Summary (Last 24 hours) at 07/05/2023 1207 Last data filed at 07/05/2023 1039 Gross per 24 hour  Intake 240 ml  Output 400 ml  Net -160 ml    Additional Objective Labs: Basic Metabolic Panel: Recent Labs  Lab 07/01/23 0727 07/02/23 0800 07/03/23 0458 07/04/23 0907 07/05/23 0519  NA 139 142 140 141 142  K 4.9 5.4* 5.3* 6.1* 7.3*  CL 102 106 105 108 108  CO2 24 20* 21* 19* 19*  GLUCOSE 107* 93 92 105* 90  BUN 85* 111* 78* 105* 132*  CREATININE 8.96* 10.38* 7.57* 9.54* 10.72*  CALCIUM  9.1 9.5 9.3 9.4 9.3  PHOS 6.1* 7.1* 5.7*  --   --    Liver Function Tests: Recent Labs  Lab 06/30/23 0545 07/01/23 0727 07/02/23 0800 07/03/23 0458 07/04/23 0907  AST 32 70*  --   --  25  ALT 31 63*  --   --  54*  ALKPHOS 52 66  --   --  65  BILITOT 0.7 0.6  --   --  0.4  PROT 5.7* 5.9*  --   --  6.0*  ALBUMIN  2.1* 2.1* 2.1* 2.1* 2.2*   No results for input(s): "LIPASE", "AMYLASE" in the last 168 hours. CBC: Recent Labs  Lab 07/01/23 0727 07/02/23 0800 07/03/23 0458 07/04/23 0907 07/05/23 0519  WBC 10.3 11.3* 11.5* 11.3*  10.9*  NEUTROABS 6.6 7.4  --   --   --   HGB 7.4* 7.0* 7.6* 7.8* 7.2*  HCT 23.8* 22.9* 24.8* 26.1* 23.5*  MCV 74.4* 74.6* 74.7* 77.7* 74.8*  PLT 220 222 234 231 240   Blood Culture    Component Value Date/Time   SDES BLOOD RIGHT ARM 06/25/2023 0911   SPECREQUEST  06/25/2023 0911    BOTTLES DRAWN AEROBIC AND ANAEROBIC Blood Culture adequate volume   CULT  06/25/2023 0911    NO GROWTH 5 DAYS Performed at [facility], [address], [name], [place] [address]    REPTSTATUS 06/30/2023 FINAL 06/25/2023 0911     CBG: Recent Labs  Lab 07/02/23 1146 07/02/23 1729 07/02/23 2043 07/03/23 2110 07/04/23 1155  GLUCAP 84 116* 75 120* 106*   Iron Studies:  Recent Labs    07/03/23 0458  IRON 11*  TIBC 158*  FERRITIN 1,569*   Lab Results  Component Value Date   INR 1.2 05/06/2023   INR 1.2 05/05/2023   INR 1.1 03/01/2022  Studies/Results: No results found.  Medications:  sodium chloride  10 mL/hr at 06/24/23 2003    acidophilus  2 capsule Oral TID   amiodarone   200 mg Oral Daily   vitamin C   1,000 mg Oral Daily   aspirin  EC  81 mg Oral Q breakfast   atorvastatin   80 mg Oral QHS   calcium  acetate  1,334 mg Oral TID WC   carvedilol   25 mg Oral BID   Chlorhexidine  Gluconate Cloth  6 each Topical Q0600   darbepoetin (ARANESP ) injection - NON-DIALYSIS  100 mcg Subcutaneous Q Thu-1800   feeding supplement (NEPRO CARB STEADY)  237 mL Oral TID BM   heparin   5,000 Units Subcutaneous Q8H   heparin  sodium (porcine)  3,000 Units Intravenous Once   levETIRAcetam   500 mg Oral Once per day on Sunday Tuesday Thursday Saturday   levETIRAcetam   750 mg Oral Once per day on Monday Wednesday Friday   liver oil-zinc  oxide   Topical BID   multivitamin  1 tablet Oral QHS   nutrition supplement (JUVEN)  1 packet Oral BID BM   pregabalin   50 mg Oral Daily   sodium chloride  flush  3 mL Intravenous Q12H   zinc  sulfate (50mg  elemental zinc )  220 mg Oral Daily    Dialysis  Orders: [facility] TTS 3 hr  edw 77kg,  LUE AVF   Heparin  1000 bolus + 400u/hr Mircera 100 mcg  q 2wks due  4/28 ( was no show)  No vitamin D   Assessment/Plan: Infected wound L foot: hx of R AKA for similar issues. --- S/p L AKA on 5/2  ESRD: on HD TTS.  Next HD 07/05/23 HTN/Vol: Soft BPs. Stable. No volume on exam. Minimal UF with HD.  Anemia:  S/p 1 unit PRBCs 5/6. Transfuse prn.  On Aranesp  100 q Thus -last on 5/8. Check Fe studies, now off IV abx.   Secondary hyperparathyroidism: CorrCa elevated -not on Vit D. Phos above goal on Ca acetate binder --follow trends.  Nutrition: optimize as able. Prot supp per RD.  Hyperkalemia: K 7.3 today. Lokelma  10 mg TID ordered for this patient due to not having sufficient HD today.  Seizure d/o - on keppra  DM2 - per pmd Diarrhea - +C. Diff and norovirus on admit. - s/p po vanc  FTT - poor po intake, sacral wound, recs for SNF    Hersey Lorenzo, NP [facility] 07/05/2023,12:07 PM

## 2023-07-05 NOTE — Plan of Care (Signed)
  Problem: Clinical Measurements: Goal: Diagnostic test results will improve Outcome: Progressing Goal: Respiratory complications will improve Outcome: Progressing Goal: Cardiovascular complication will be avoided Outcome: Progressing   Problem: Elimination: Goal: Will not experience complications related to bowel motility Outcome: Progressing Goal: Will not experience complications related to urinary retention Outcome: Progressing   Problem: Pain Managment: Goal: General experience of comfort will improve and/or be controlled Outcome: Progressing   Problem: Safety: Goal: Ability to remain free from injury will improve Outcome: Progressing   Problem: Health Behavior/Discharge Planning: Goal: Ability to manage health-related needs will improve Outcome: Not Progressing   Problem: Clinical Measurements: Goal: Ability to maintain clinical measurements within normal limits will improve Outcome: Not Progressing Goal: Will remain free from infection Outcome: Not Progressing   Problem: Activity: Goal: Risk for activity intolerance will decrease Outcome: Not Progressing   Problem: Nutrition: Goal: Adequate nutrition will be maintained Outcome: Not Progressing   Problem: Coping: Goal: Level of anxiety will decrease Outcome: Not Progressing   Problem: Skin Integrity: Goal: Risk for impaired skin integrity will decrease Outcome: Not Progressing  Refusing dressing changes. Eating little but doing better with supper. Flat affect. Resistant to turning. Stools have slowed down.

## 2023-07-05 NOTE — Progress Notes (Signed)
 Received patient in bed. Alert and oriented to person.Lethargic but otherwise arousable.Consent verified.  Access used : Left arm avf that worked well initially then after 35 minutes inlfiltrated.Renal md at bedside,aware.  Fluid removed. 400 cc.  Hand off to the patient's nurse ,back into her room with stable medical condition via transporter.

## 2023-07-05 NOTE — Progress Notes (Addendum)
 PROGRESS NOTE  [name]. 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  He was found to have an infected ischemic wound of the left foot, eventually underwent left AKA on 5/2  Subjective / 24h Interval events: No specific complaints, no nausea or vomiting.  No chest pains.  Awaiting HD  Assesement and Plan: Active Problems:   C. difficile colitis   Syncope   Hypoglycemia   Ischemic ulcer of left heel (HCC)   Failure to thrive in adult   Gangrene of left foot (HCC)   Malnutrition of moderate degree  Principal problem Syncopal episode, hypoglycemia -slumped over and unconscious in PCPs office.  This is likely due to poor p.o. intake - Encourage p.o. intake, no hypoglycemia since 5/8 and if fed he is eating well - Cortisol adequate at 11.1 at around 10 AM, no adrenal insufficiency.  TSH unremarkable  Active problems Infected ischemic ulcer of the left heel - History of gangrenous wounds on the right and has ultimately had a right AKA.  On admission he was found to have an infected ulcer of the left heel, orthopedic surgery consulted, he is now status post left AKA on 5/2.  Initially he was maintained on broad-spectrum antibiotics, now discontinued followed by amputation.  Wound VAC removed 5/8, however has some retained products that are now dried over and removal of wound vac remnants is very difficult -Dr Julio Ohm to take a look tomorrow and see if he can remove.  Diarrhea -positive for C. difficile and norovirus on admission.  Has completed a course of p.o. vancomycin .  Required rectal tube initially, now removed on  5/4 - Improving  Hyperkalemia-potassium significantly up at 7 this morning, was supposed to be dialyzed first thing this morning but his access infiltrated and could not complete dialysis.  He will be given Lokelma  per nephrology, will reattempt dialysis later on tonight  Fever-100.9 on 5/10, no fever since.   Failure to thrive - Progressive weakness now wheelchair-bound and using a [name]; despite recent SNF stay. diminished oral intake only taking 1 meal per day. - Nutrition consulted - PT/OT consulted, recs for SNF, placement pending, unfortunately insurance denied, but eventually approved upon appeal.  He is stable to go when his hyperkalemia is resolved and nephrology is clearing him for discharge   Troponin elevation - No history of chest pain. High - sensitivity troponin low level elevated and flat 28 -> 30 which is consistent with demand ischemia, also underlying ESRD.  - Continue aspirin , statin, Coreg    Sacral wound - WOC consulted per above   ESRD,Tu/Th/Sat HD -Nephrology consulted for HD.  Hyperkalemic as above   CAD history STEMI / Hx CVA - Continue home aspirin , atorvastatin  80 mg,  Coreg  12.5 mg twice daily   HFpEF -  Without acute exacerbation.  Not on diuretics   Paroxysmal A-fib off anticoagulation - currently in sinus rhythm, not on anticoagulation.  Continue home amiodarone , coreg  per above.    Hx IPH - Noted, off AC.    Seizure disorder - Continue home Keppra  500 mg nondialysis days, 750 mg on HD days.   DM type II diet  controlled - Recent hypoglycemia, not on diabetic medications.   CBG (last 3)  Recent Labs    07/02/23 2043 07/03/23 2110 07/04/23 1155  GLUCAP 75 120* 106*     Hypertension - continue home antihypertensives, pressures acceptable  Chronic anemia - Hx anemia CKD, received 2 units of packed red blood cells.  Hemoglobin has been in the 7 range for the last several days and overall stable   Enucleation OS, hx DM retinopathy, glaucoma - Noted,  ophtho f/u OP.   Scheduled Meds:  acidophilus  2 capsule Oral TID   amiodarone   200 mg Oral Daily   vitamin C   1,000 mg Oral Daily   aspirin  EC  81 mg Oral Q breakfast   atorvastatin   80 mg Oral QHS   calcium  acetate  1,334 mg Oral TID WC   carvedilol   25 mg Oral BID   Chlorhexidine  Gluconate Cloth  6 each Topical Q0600   darbepoetin (ARANESP ) injection - NON-DIALYSIS  100 mcg Subcutaneous Q Thu-1800   feeding supplement (NEPRO CARB STEADY)  237 mL Oral TID BM   heparin   5,000 Units Subcutaneous Q8H   heparin  sodium (porcine)  3,000 Units Intravenous Once   levETIRAcetam   500 mg Oral Once per day on Sunday Tuesday Thursday Saturday   levETIRAcetam   750 mg Oral Once per day on Monday Wednesday Friday   liver oil-zinc  oxide   Topical BID   multivitamin  1 tablet Oral QHS   nutrition supplement (JUVEN)  1 packet Oral BID BM   pregabalin   50 mg Oral Daily   sodium chloride  flush  3 mL Intravenous Q12H   zinc  sulfate (50mg  elemental zinc )  220 mg Oral Daily   Continuous Infusions:  sodium chloride  10 mL/hr at 06/24/23 2003   PRN Meds:.acetaminophen , alteplase , heparin , hydrALAZINE , lidocaine  (PF), lidocaine -prilocaine , melatonin, ondansetron  (ZOFRAN ) IV, oxyCODONE , pentafluoroprop-tetrafluoroeth, polyethylene glycol  Current Outpatient Medications  Medication Instructions   acetaminophen  (TYLENOL ) 650 mg, Oral, Every 6 hours PRN   Amino Acids-Protein Hydrolys (PRO-STAT AWC) LIQD 30 mLs, Every morning   amiodarone  (PACERONE ) 200 mg, Oral, Daily   ammonium lactate  (AMLACTIN DAILY) 12 % lotion Apply topically.   ascorbic acid  (VITAMIN C ) 1,000 mg, Daily   aspirin  EC 81 mg, Oral, Daily with breakfast, Swallow whole.   atorvastatin  (LIPITOR ) 80 mg, Oral, Daily at bedtime   brimonidine  (ALPHAGAN ) 0.2 % ophthalmic solution 1 drop, Right Eye, 3 times daily   calcitRIOL  (ROCALTROL ) 2.25 mcg, Oral, Every T-Th-Sa (1800)   calcium  acetate (PHOSLO ) 1,334 mg, 3 times daily   carvedilol  (COREG )  12.5 mg, Oral, 2 times daily   cinacalcet  (SENSIPAR ) 60 mg, Oral, Every T-Th-Sa (1800)   dorzolamide -timolol  (COSOPT ) 2-0.5 % ophthalmic solution 1 drop, 2 times daily   levETIRAcetam  (KEPPRA ) 500 mg, Oral, 4 times weekly, On Sunday, Monday, Wednesday and Friday, all non-dialysis days   levETIRAcetam  (KEPPRA ) 750 mg, Oral, 3 times weekly, On Tuesday Thursday and Saturday, on dialysis days   lidocaine -prilocaine  (EMLA ) cream 1 Application, 3 times weekly   loperamide  (IMODIUM ) 2 mg, Oral, As needed   multivitamin (RENA-VIT) TABS tablet 1 tablet, Oral, Daily at bedtime   Nutritional Supplements (,FEEDING SUPPLEMENT, PROSOURCE PLUS) liquid 30 mLs, Oral, 3 times daily between meals   ondansetron  (ZOFRAN ) 4 mg, Oral, Every 6 hours PRN   oxyCODONE  (OXY IR/ROXICODONE ) 5 mg, Oral, Every 4 hours PRN   polyethylene glycol (MIRALAX  / GLYCOLAX ) 17 g, Oral, Daily   Zinc  Oxide (DESITIN MAXIMUM STRENGTH) 40 % PSTE 1  Application, Apply externally, 2 times daily PRN    Diet Orders (From admission, onward)     Start     Ordered   06/30/23 1518  Diet regular Room service appropriate? Yes; Fluid consistency: Thin; Fluid restriction: 1200 mL Fluid  Diet effective now       Question Answer Comment  Room service appropriate? Yes   Fluid consistency: Thin   Fluid restriction: 1200 mL Fluid      06/30/23 1518            DVT prophylaxis: heparin  injection 5,000 Units Start: 06/21/23 0600   Lab Results  Component Value Date   PLT 240 07/05/2023      Code Status: Limited: Do not attempt resuscitation (DNR) -DNR-LIMITED -Do Not Intubate/DNI   Family Communication: no family at bedside   Status is: Inpatient Remains inpatient appropriate because: placement   Level of care: Telemetry Medical  Consultants:  Nephrology   Objective: Vitals:   07/05/23 1015 07/05/23 1030 07/05/23 1036 07/05/23 1039  BP: (!) 95/34 (!) 98/48 (!) 98/48 (!) 111/55  Pulse:      Resp:      Temp:    (!) 97.3 F  (36.3 C)  TempSrc:    Axillary  SpO2:      Weight:      Height:        Intake/Output Summary (Last 24 hours) at 07/05/2023 1046 Last data filed at 07/05/2023 1039 Gross per 24 hour  Intake 480 ml  Output 400 ml  Net 80 ml   Wt Readings from Last 3 Encounters:  07/02/23 69.5 kg  06/16/23 74.4 kg  05/30/23 74.4 kg    Examination: Constitutional: NAD Eyes: lids and conjunctivae normal, no scleral icterus ENMT: mmm Neck: normal, supple Respiratory: clear to auscultation bilaterally, no wheezing, no crackles. Normal respiratory effort.  Cardiovascular: Regular rate and rhythm, no murmurs / rubs / gallops. No LE edema. Abdomen: soft, no distention, no tenderness. Bowel sounds positive.   Data Reviewed: I have independently reviewed following labs and imaging studies   CBC Recent Labs  Lab 07/01/23 0727 07/02/23 0800 07/03/23 0458 07/04/23 0907 07/05/23 0519  WBC 10.3 11.3* 11.5* 11.3* 10.9*  HGB 7.4* 7.0* 7.6* 7.8* 7.2*  HCT 23.8* 22.9* 24.8* 26.1* 23.5*  PLT 220 222 234 231 240  MCV 74.4* 74.6* 74.7* 77.7* 74.8*  MCH 23.1* 22.8* 22.9* 23.2* 22.9*  MCHC 31.1 30.6 30.6 29.9* 30.6  RDW 24.0* 25.0* 24.7* 24.3* 24.5*  LYMPHSABS 1.4 1.3  --   --   --   MONOABS 1.2* 1.3*  --   --   --   EOSABS 1.1* 1.2*  --   --   --   BASOSABS 0.1 0.1  --   --   --     Recent Labs  Lab 06/29/23 0829 06/30/23 0545 07/01/23 0727 07/02/23 0800 07/03/23 0458 07/04/23 0907 07/05/23 0519  NA 141 138 139 142 140 141 142  K 4.5 4.5 4.9 5.4* 5.3* 6.1* 7.3*  CL 107 100 102 106 105 108 108  CO2 21* 25 24 20* 21* 19* 19*  GLUCOSE 80 87 107* 93 92 105* 90  BUN 81* 58* 85* 111* 78* 105* 132*  CREATININE 10.31* 7.24* 8.96* 10.38* 7.57* 9.54* 10.72*  CALCIUM  9.1 8.9 9.1 9.5 9.3 9.4 9.3  AST 43* 32 70*  --   --  25  --   ALT 30 31 63*  --   --  54*  --  ALKPHOS 54 52 66  --   --  65  --   BILITOT 0.8 0.7 0.6  --   --  0.4  --   ALBUMIN  2.1* 2.1* 2.1* 2.1* 2.1* 2.2*  --   MG  --  2.1 2.3   --   --  2.7*  --     ------------------------------------------------------------------------------------------------------------------ No results for input(s): "CHOL", "HDL", "LDLCALC", "TRIG", "CHOLHDL", "LDLDIRECT" in the last 72 hours.  Lab Results  Component Value Date   HGBA1C 5.0 05/05/2023   ------------------------------------------------------------------------------------------------------------------ No results for input(s): "TSH", "T4TOTAL", "T3FREE", "THYROIDAB" in the last 72 hours.  Invalid input(s): "FREET3"  Cardiac Enzymes No results for input(s): "CKMB", "TROPONINI", "MYOGLOBIN" in the last 168 hours.  Invalid input(s): "CK" ------------------------------------------------------------------------------------------------------------------    Component Value Date/Time   BNP 1,416.0 (H) 11/21/2021 1117    CBG: Recent Labs  Lab 07/02/23 1146 07/02/23 1729 07/02/23 2043 07/03/23 2110 07/04/23 1155  GLUCAP 84 116* 75 120* 106*    No results found for this or any previous visit (from the past 240 hours).    Radiology Studies: No results found.   Kathlen Para, MD, PhD [facility]  Between 7 am - 7 pm I am available, please contact me via Amion (for emergencies) or Securechat (non urgent messages)  Between 7 pm - 7 am I am not available, please contact night coverage MD/APP via Amion

## 2023-07-05 NOTE — Progress Notes (Signed)
 Wound vac has been off suction and pt will not allow wound vac dressing to be removed from L AKA. Foul smelling odor from wound noted. Dr. Aldona Amel present. We attempted to remove dressing but incision line is sticking to dressing. Pt demands we stop. MD asked for me to stop and have wound care RN come to room today. They were consulted by MD and myself and chatted with them. Melody Fabian Holster RN, Wound care nurse, said she has consulted with Dr. Julio Ohm and they will be by tomorrow to assess wound. Dr. Aldona Amel aware of plan for tomorrow.

## 2023-07-06 DIAGNOSIS — R627 Adult failure to thrive: Secondary | ICD-10-CM | POA: Diagnosis not present

## 2023-07-06 DIAGNOSIS — R630 Anorexia: Secondary | ICD-10-CM | POA: Diagnosis not present

## 2023-07-06 DIAGNOSIS — F32A Depression, unspecified: Secondary | ICD-10-CM | POA: Diagnosis not present

## 2023-07-06 LAB — RENAL FUNCTION PANEL
Albumin: 1.9 g/dL — ABNORMAL LOW (ref 3.5–5.0)
Anion gap: 14 (ref 5–15)
BUN: 146 mg/dL — ABNORMAL HIGH (ref 6–20)
CO2: 20 mmol/L — ABNORMAL LOW (ref 22–32)
Calcium: 9.3 mg/dL (ref 8.9–10.3)
Chloride: 107 mmol/L (ref 98–111)
Creatinine, Ser: 11.08 mg/dL — ABNORMAL HIGH (ref 0.61–1.24)
GFR, Estimated: 5 mL/min — ABNORMAL LOW (ref >60)
Glucose, Bld: 78 mg/dL (ref 70–99)
Phosphorus: 10.6 mg/dL — ABNORMAL HIGH (ref 2.5–4.6)
Potassium: 7.1 mmol/L (ref 3.5–5.1)
Sodium: 141 mmol/L (ref 135–145)

## 2023-07-06 LAB — CBC
HCT: 22.1 % — ABNORMAL LOW (ref 39.0–52.0)
Hemoglobin: 6.6 g/dL — CL (ref 13.0–17.0)
MCH: 22.8 pg — ABNORMAL LOW (ref 26.0–34.0)
MCHC: 29.9 g/dL — ABNORMAL LOW (ref 30.0–36.0)
MCV: 76.2 fL — ABNORMAL LOW (ref 80.0–100.0)
Platelets: 244 10*3/uL (ref 150–400)
RBC: 2.9 MIL/uL — ABNORMAL LOW (ref 4.22–5.81)
RDW: 23.6 % — ABNORMAL HIGH (ref 11.5–15.5)
WBC: 10.2 10*3/uL (ref 4.0–10.5)
nRBC: 0 % (ref 0.0–0.2)

## 2023-07-06 LAB — PREPARE RBC (CROSSMATCH)

## 2023-07-06 MED ORDER — ALBUMIN HUMAN 25 % IV SOLN
INTRAVENOUS | Status: AC
  Filled 2023-07-06: qty 100

## 2023-07-06 MED ORDER — ALBUMIN HUMAN 25 % IV SOLN
25.0000 g | Freq: Once | INTRAVENOUS | Status: AC
  Administered 2023-07-06: 25 g via INTRAVENOUS

## 2023-07-06 MED ORDER — SODIUM CHLORIDE 0.9% IV SOLUTION: Freq: Once | INTRAVENOUS | Status: AC

## 2023-07-06 MED ORDER — HEPARIN SODIUM (PORCINE) 1000 UNIT/ML IJ SOLN
INTRAMUSCULAR | Status: AC
  Filled 2023-07-06: qty 2

## 2023-07-06 NOTE — Progress Notes (Signed)
 PT Cancellation Note  Patient Details Name: [name]. MRN: [id number] DOB: 1970/12/02   Cancelled Treatment:    Reason Eval/Treat Not Completed: Patient declined, no reason specified (Pt resting in bed. Eyes closed, not engaging in conversation with PT. Depsite encouragement pt continued to decline therapy this date. Will follow up at later date/time as schedule allows and pt able.)    Corbin Dess PT, DPT Acute Rehabilitation Services Office [phone]  07/06/23 3:56 PM

## 2023-07-06 NOTE — Progress Notes (Signed)
 Patient ID: [name] Porta., male   DOB: 11-Jul-1970, 53 y.o.   MRN: [id number] Wound VAC dressing removed.  The incision is clean and dry.  Plan for daily dressing changes.  Patient will not discharge with the wound VAC.

## 2023-07-06 NOTE — Progress Notes (Addendum)
 Received patient in bed to unit.  Alert and oriented.  Informed consent signed and in chart.   TX duration:3.5 hours  Patient tolerated well.  Transported back to the room  Alert, without acute distress.   Lab informed me of 6.6 Hemoglobin, and I then informed Dr. Joan Mouton.  Dr.  Joan Mouton ordered blood, and I sent for "Type and Screen"  to blood bank lab.    Hand-off given to patient's nurse. Informed her that Blood will be ready after patient comes back to floor.  Access used: Left Fistula Upper ARm Access issues: none  Total UF removed: 3L Medication(s) given: ALbumin    07/06/23 1123  Vitals  Temp 98.7 F (37.1 C)  Temp Source Oral  BP 111/64  MAP (mmHg) 78  Pulse Rate (!) 59  ECG Heart Rate 63  Resp 11  Oxygen Therapy  SpO2 98 %  O2 Device Room Air  Patient Activity (if Appropriate) In bed  Pulse Oximetry Type Continuous  Oximetry Probe Site Changed No  During Treatment Monitoring  Dialysate Potassium Concentration 2  Dialysate Calcium  Concentration 2.5  Duration of HD Treatment -hour(s) 3 hour(s)  HD Safety Checks Performed Yes  Intra-Hemodialysis Comments Tx completed  Dialysis Fluid Bolus Normal Saline  Bolus Amount (mL) 300 mL  Post Treatment  Dialyzer Clearance Lightly streaked  Liters Processed 70.6  Fluid Removed (mL) 3000 mL  Tolerated HD Treatment Yes  AVG/AVF Arterial Site Held (minutes) 7 minutes  AVG/AVF Venous Site Held (minutes) 7 minutes  Fistula / Graft Left Upper arm  No placement date or time found.   Orientation: Left  Access Location: Upper arm  Status Deaccessed     Luciano Ruths LPN [facility]

## 2023-07-06 NOTE — Progress Notes (Signed)
 [facility] Progress Note   Subjective:    Seen on HD today. Patient able to get 3.5 hours of HD today. BP stable. K very high despite being on lokelma  TID. Rechecking K this afternoon. Hgb 6.6 today. 1 U of PRBC ordered.   Physical Exam General: Alert, oriented, nad Heart: RRR Lungs: Clear, normal wob Abdomen: Soft, non-distended Extremities: R AKA, L stump w/wound vac  Dialysis Access: LUE AVF + t/b, infiltration today and has swelling above access.   Filed Weights   07/02/23 1113 07/06/23 0753 07/06/23 1127  Weight: 69.5 kg 72 kg 69 kg    Intake/Output Summary (Last 24 hours) at 07/06/2023 1422 Last data filed at 07/06/2023 1124 Gross per 24 hour  Intake 620 ml  Output 6000 ml  Net -5380 ml    Additional Objective Labs: Basic Metabolic Panel: Recent Labs  Lab 07/02/23 0800 07/03/23 0458 07/04/23 0907 07/05/23 0519 07/06/23 0757  NA 142 140 141 142 141  K 5.4* 5.3* 6.1* 7.3* 7.1*  CL 106 105 108 108 107  CO2 20* 21* 19* 19* 20*  GLUCOSE 93 92 105* 90 78  BUN 111* 78* 105* 132* 146*  CREATININE 10.38* 7.57* 9.54* 10.72* 11.08*  CALCIUM  9.5 9.3 9.4 9.3 9.3  PHOS 7.1* 5.7*  --   --  10.6*   Liver Function Tests: Recent Labs  Lab 06/30/23 0545 07/01/23 0727 07/02/23 0800 07/03/23 0458 07/04/23 0907 07/06/23 0757  AST 32 70*  --   --  25  --   ALT 31 63*  --   --  54*  --   ALKPHOS 52 66  --   --  65  --   BILITOT 0.7 0.6  --   --  0.4  --   PROT 5.7* 5.9*  --   --  6.0*  --   ALBUMIN  2.1* 2.1*   < > 2.1* 2.2* 1.9*   < > = values in this interval not displayed.   No results for input(s): "LIPASE", "AMYLASE" in the last 168 hours. CBC: Recent Labs  Lab 07/01/23 0727 07/02/23 0800 07/03/23 0458 07/04/23 0907 07/05/23 0519 07/06/23 0757  WBC 10.3 11.3* 11.5* 11.3* 10.9* 10.2  NEUTROABS 6.6 7.4  --   --   --   --   HGB 7.4* 7.0* 7.6* 7.8* 7.2* 6.6*  HCT 23.8* 22.9* 24.8* 26.1* 23.5* 22.1*  MCV 74.4* 74.6* 74.7* 77.7* 74.8* 76.2*   PLT 220 222 234 231 240 244   Blood Culture    Component Value Date/Time   SDES BLOOD RIGHT ARM 06/25/2023 0911   SPECREQUEST  06/25/2023 0911    BOTTLES DRAWN AEROBIC AND ANAEROBIC Blood Culture adequate volume   CULT  06/25/2023 0911    NO GROWTH 5 DAYS Performed at [facility], [address], [place], [place] [address]    REPTSTATUS 06/30/2023 FINAL 06/25/2023 0911     CBG: Recent Labs  Lab 07/02/23 2043 07/03/23 2110 07/04/23 1155 07/05/23 1224 07/05/23 1636  GLUCAP 75 120* 106* 73 78   Iron Studies:  No results for input(s): "IRON", "TIBC", "TRANSFERRIN", "FERRITIN" in the last 72 hours.  Lab Results  Component Value Date   INR 1.2 05/06/2023   INR 1.2 05/05/2023   INR 1.1 03/01/2022   Studies/Results: No results found.  Medications:  sodium chloride  10 mL/hr at 06/24/23 2003    acidophilus  2 capsule Oral TID   amiodarone   200 mg Oral Daily   vitamin C   1,000 mg Oral Daily   aspirin  EC  81 mg Oral Q breakfast   atorvastatin   80 mg Oral QHS   calcium  acetate  1,334 mg Oral TID WC   carvedilol   25 mg Oral BID   Chlorhexidine  Gluconate Cloth  6 each Topical Q0600   darbepoetin (ARANESP ) injection - NON-DIALYSIS  100 mcg Subcutaneous Q Thu-1800   feeding supplement (NEPRO CARB STEADY)  237 mL Oral TID BM   heparin   5,000 Units Subcutaneous Q8H   levETIRAcetam   500 mg Oral Once per day on Sunday Tuesday Thursday Saturday   levETIRAcetam   750 mg Oral Once per day on Monday Wednesday Friday   liver oil-zinc  oxide   Topical BID   multivitamin  1 tablet Oral QHS   nutrition supplement (JUVEN)  1 packet Oral BID BM   pregabalin   50 mg Oral Daily   sodium chloride  flush  3 mL Intravenous Q12H   sodium zirconium cyclosilicate   10 g Oral TID   zinc  sulfate (50mg  elemental zinc )  220 mg Oral Daily    Dialysis Orders: [facility] TTS 3 hr  edw 77kg,  LUE AVF   Heparin  1000 bolus + 400u/hr Mircera 100 mcg  q 2wks due  4/28 ( was no show)   No vitamin D   Assessment/Plan: Infected wound L foot: hx of R AKA for similar issues. --- S/p L AKA on 5/2  ESRD: on HD TTS.   HD 07/06/23 HTN/Vol: Stable BPs. Stable. No volume on exam. 3L of UF today Anemia:  S/p 1 unit PRBCs 5/6. Transfuse 07/06/23 1 U PRBC.  On Aranesp  100 q Thus -last on 5/8. Check Fe studies, now off IV abx.   Secondary hyperparathyroidism: CorrCa elevated -not on Vit D. Phos above goal on Ca acetate binder --follow trends.  Nutrition: optimize as able. Prot supp per RD.  Hyperkalemia: K 7.1 today. Lokelma  10 mg TID continued for this patient. Awaiting update BMET Seizure d/o - on keppra  DM2 - per pmd Diarrhea - +C. Diff and norovirus on admit. - s/p po vanc  FTT - poor po intake, sacral wound, recs for SNF    Hersey Lorenzo, NP [facility] 07/06/2023,2:22 PM

## 2023-07-06 NOTE — Plan of Care (Signed)
    Problem: Clinical Measurements: Goal: Will remain free from infection Outcome: Progressing Goal: Respiratory complications will improve Outcome: Progressing Goal: Cardiovascular complication will be avoided Outcome: Progressing   Problem: Nutrition: Goal: Adequate nutrition will be maintained Outcome: Progressing   Problem: Health Behavior/Discharge Planning: Goal: Ability to manage health-related needs will improve Outcome: Not Progressing   Problem: Clinical Measurements: Goal: Ability to maintain clinical measurements within normal limits will improve Outcome: Not Progressing Goal: Diagnostic test results will improve Outcome: Not Progressing   Problem: Activity: Goal: Risk for activity intolerance will decrease Outcome: Not Progressing  Ate more today. Stools have decreased. Blood transfusion at present. Resistant to care.

## 2023-07-06 NOTE — Progress Notes (Addendum)
 Date and time results received: 07/06/23 945AM Test: Hemoglobin Critical Value: 6.6  Name of Provider Notified: Dr. Joan Mouton  Dr. Joan Mouton ordered blood.  I sent Type an screen to Blood bank at 1015AM  Luciano Ruths, LPN-K[facility]

## 2023-07-06 NOTE — Progress Notes (Signed)
 Progress Note   Patient: [name] Wintermute Jr. 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 He was found to have an infected ischemic wound of the left foot, eventually underwent left AKA on 5/2   Assessment and Plan: Principal problem Syncopal episode, hypoglycemia -slumped over and unconscious in PCPs office.  This is likely due to poor p.o. intake - Encourage p.o. intake, no hypoglycemia since 5/8 and if fed he is eating well - Cortisol adequate at 11.1 at around 10 AM, no adrenal insufficiency.  TSH unremarkable   Active problems Infected ischemic ulcer of the left heel - History of gangrenous wounds on the right and has ultimately had a right AKA.  On admission he was found to have an infected ulcer of the left heel, orthopedic surgery consulted, he is now status post left AKA on 5/2.  Initially he was maintained on broad-spectrum antibiotics, now discontinued followed by amputation.  Wound VAC removed 5/8, however has some retained products that are now dried over and removal of wound vac remnants is very difficult -Wound vac removed 5/14 by Dr. Julio Ohm. Wound not discharge with wound vac   Diarrhea -positive for C. difficile and norovirus on admission.  Has completed a course of p.o. vancomycin .  Required rectal tube initially, now removed on 5/4 - Improving. Stools no longer watery, but is soft and forming   Hyperkalemia-potassium significantly up at 7 on 5/13, -Underwent HD 5/14   Fever-100.9 on 5/10, no fever since.    Failure to thrive - Progressive weakness now wheelchair-bound and using a [name]; despite recent  SNF stay. diminished oral intake only taking 1 meal per day. - Nutrition consulted - PT/OT consulted, recs for SNF, placement pending, unfortunately insurance denied, but eventually approved upon appeal.  He is stable to go when his hyperkalemia is resolved and nephrology is clearing him for discharge   Troponin elevation - No history of chest pain. High - sensitivity troponin low level elevated and flat 28 -> 30 which is consistent with demand ischemia, also underlying ESRD.  - Continue aspirin , statin, Coreg    Sacral wound - WOC consulted per above   ESRD,Tu/Th/Sat HD -Nephrology consulted for HD.    CAD history STEMI / Hx CVA - Continue home aspirin , atorvastatin  80 mg,  Coreg  12.5 mg twice daily   HFpEF -  Without acute exacerbation.  Not on diuretics   Paroxysmal A-fib off anticoagulation - currently in sinus rhythm, not on anticoagulation.  Continue home amiodarone , coreg  per above.    Hx IPH - Noted, off AC.    Seizure disorder - Continue home Keppra  500 mg nondialysis days, 750 mg on HD days.   DM type II diet controlled - Recent hypoglycemia, not on diabetic medications.    Hypertension - continue home antihypertensives, pressures acceptable   Chronic anemia - Hx anemia CKD, received 2 units of packed red blood cells. Hgb down to 6.6 this AM, ordered 1 unit PRBC   Enucleation OS, hx DM retinopathy, glaucoma - Noted, ophtho f/u OP.       Subjective: Having soft stool. No longer watery. One episode this AM  Physical  Exam: Vitals:   07/06/23 1135 07/06/23 1201 07/06/23 1415 07/06/23 1445  BP: 139/66 (!) 141/75 121/64 (!) 127/56  Pulse: 61 63 83 70  Resp: 12  14 16   Temp:  98.9 F (37.2 C) 98.4 F (36.9 C) 98.6 F (37 C)  TempSrc:  Oral Oral   SpO2: 98% 100% 97%   Weight:      Height:       General exam: Awake, laying in bed, in nad Respiratory system: Normal respiratory effort, no wheezing Cardiovascular system: regular rate, s1, s2 Gastrointestinal system:  Soft, nondistended, positive BS Central nervous system: CN2-12 grossly intact, strength intact Extremities: Perfused, no clubbing Skin: Normal skin turgor, no notable skin lesions seen Psychiatry: Mood normal // no visual hallucinations   Data Reviewed:  Labs reviewed: Na 141, K 7.1, WBC 10.2, Hgb 6.6, Plts 244  Family Communication: Pt in room, family not at bedside  Disposition: Status is: Inpatient Remains inpatient appropriate because: severity of illness  Planned Discharge Destination: Skilled nursing facility    Author: Cherylle Corwin, MD 07/06/2023 4:24 PM  For on call review [email or web address].

## 2023-07-07 DIAGNOSIS — R627 Adult failure to thrive: Secondary | ICD-10-CM | POA: Diagnosis not present

## 2023-07-07 DIAGNOSIS — R630 Anorexia: Secondary | ICD-10-CM | POA: Diagnosis not present

## 2023-07-07 DIAGNOSIS — F32A Depression, unspecified: Secondary | ICD-10-CM | POA: Diagnosis not present

## 2023-07-07 LAB — BASIC METABOLIC PANEL WITH GFR
Anion gap: 16 — ABNORMAL HIGH (ref 5–15)
BUN: 109 mg/dL — ABNORMAL HIGH (ref 6–20)
CO2: 23 mmol/L (ref 22–32)
Calcium: 9.5 mg/dL (ref 8.9–10.3)
Chloride: 101 mmol/L (ref 98–111)
Creatinine, Ser: 8.56 mg/dL — ABNORMAL HIGH (ref 0.61–1.24)
GFR, Estimated: 7 mL/min — ABNORMAL LOW (ref >60)
Glucose, Bld: 125 mg/dL — ABNORMAL HIGH (ref 70–99)
Potassium: 5.3 mmol/L — ABNORMAL HIGH (ref 3.5–5.1)
Sodium: 140 mmol/L (ref 135–145)

## 2023-07-07 LAB — TYPE AND SCREEN
ABO/RH(D): A POS
Antibody Screen: NEGATIVE
Unit division: 0

## 2023-07-07 LAB — BPAM RBC
Blood Product Expiration Date: 202506122359
ISSUE DATE / TIME: 202505141423
Unit Type and Rh: 6200

## 2023-07-07 LAB — HEMOGLOBIN AND HEMATOCRIT, BLOOD
HCT: 26.1 % — ABNORMAL LOW (ref 39.0–52.0)
Hemoglobin: 8 g/dL — ABNORMAL LOW (ref 13.0–17.0)

## 2023-07-07 MED ORDER — CHLORHEXIDINE GLUCONATE CLOTH 2 % EX PADS
6.0000 | MEDICATED_PAD | Freq: Every day | CUTANEOUS | Status: DC
  Administered 2023-07-07: 6 via TOPICAL

## 2023-07-07 NOTE — Progress Notes (Signed)
 PT Cancellation Note  Patient Details Name: [name]. MRN: [id number] DOB: [birth year]   Cancelled Treatment:    Reason Eval/Treat Not Completed: Other (comment)  Attempted to see pt and he refused due to wanting to rest prior to his afternoon dialysis. RN confirmed he will be going to HD this afternoon.    Gayle Kava, PT Acute Rehabilitation Services  Office [phone]  [place] Leep 07/07/2023, 12:17 PM

## 2023-07-07 NOTE — Progress Notes (Signed)
 [facility] Progress Note   Subjective:    Seen on HD today. Patient able to get 3.5 hours of HD today. BP stable. K very high despite being on lokelma  TID. Rechecking K this afternoon. K yesterday 7.1. Hgb 6.6 today. 1 U of PRBC given on 07/06/23. HD again today due to hyperkalemia.   Physical Exam General: Alert, oriented, nad Heart: RRR Lungs: Clear, normal wob Abdomen: Soft, non-distended Extremities: R AKA, L stump w/wound vac  Dialysis Access: LUE AVF + t/b, infiltration today and has swelling above access.   Filed Weights   07/06/23 0753 07/06/23 1127 07/07/23 1309  Weight: 72 kg 69 kg 68.8 kg    Intake/Output Summary (Last 24 hours) at 07/07/2023 1314 Last data filed at 07/06/2023 1904 Gross per 24 hour  Intake 1322 ml  Output --  Net 1322 ml    Additional Objective Labs: Basic Metabolic Panel: Recent Labs  Lab 07/02/23 0800 07/03/23 0458 07/04/23 0907 07/05/23 0519 07/06/23 0757  NA 142 140 141 142 141  K 5.4* 5.3* 6.1* 7.3* 7.1*  CL 106 105 108 108 107  CO2 20* 21* 19* 19* 20*  GLUCOSE 93 92 105* 90 78  BUN 111* 78* 105* 132* 146*  CREATININE 10.38* 7.57* 9.54* 10.72* 11.08*  CALCIUM  9.5 9.3 9.4 9.3 9.3  PHOS 7.1* 5.7*  --   --  10.6*   Liver Function Tests: Recent Labs  Lab 07/01/23 0727 07/02/23 0800 07/03/23 0458 07/04/23 0907 07/06/23 0757  AST 70*  --   --  25  --   ALT 63*  --   --  54*  --   ALKPHOS 66  --   --  65  --   BILITOT 0.6  --   --  0.4  --   PROT 5.9*  --   --  6.0*  --   ALBUMIN  2.1*   < > 2.1* 2.2* 1.9*   < > = values in this interval not displayed.   No results for input(s): "LIPASE", "AMYLASE" in the last 168 hours. CBC: Recent Labs  Lab 07/01/23 0727 07/02/23 0800 07/03/23 0458 07/04/23 0907 07/05/23 0519 07/06/23 0757  WBC 10.3 11.3* 11.5* 11.3* 10.9* 10.2  NEUTROABS 6.6 7.4  --   --   --   --   HGB 7.4* 7.0* 7.6* 7.8* 7.2* 6.6*  HCT 23.8* 22.9* 24.8* 26.1* 23.5* 22.1*  MCV 74.4* 74.6* 74.7*  77.7* 74.8* 76.2*  PLT 220 222 234 231 240 244   Blood Culture    Component Value Date/Time   SDES BLOOD RIGHT ARM 06/25/2023 0911   SPECREQUEST  06/25/2023 0911    BOTTLES DRAWN AEROBIC AND ANAEROBIC Blood Culture adequate volume   CULT  06/25/2023 0911    NO GROWTH 5 DAYS Performed at [facility], [address] [place], [place] [address]    REPTSTATUS 06/30/2023 FINAL 06/25/2023 0911     CBG: Recent Labs  Lab 07/02/23 2043 07/03/23 2110 07/04/23 1155 07/05/23 1224 07/05/23 1636  GLUCAP 75 120* 106* 73 78   Iron Studies:  No results for input(s): "IRON", "TIBC", "TRANSFERRIN", "FERRITIN" in the last 72 hours.  Lab Results  Component Value Date   INR 1.2 05/06/2023   INR 1.2 05/05/2023   INR 1.1 03/01/2022   Studies/Results: No results found.  Medications:  sodium chloride  10 mL/hr at 07/06/23 1904    acidophilus  2 capsule Oral TID   amiodarone   200 mg Oral Daily  vitamin C   1,000 mg Oral Daily   aspirin  EC  81 mg Oral Q breakfast   atorvastatin   80 mg Oral QHS   calcium  acetate  1,334 mg Oral TID WC   carvedilol   25 mg Oral BID   Chlorhexidine  Gluconate Cloth  6 each Topical Q0600   darbepoetin (ARANESP ) injection - NON-DIALYSIS  100 mcg Subcutaneous Q Thu-1800   feeding supplement (NEPRO CARB STEADY)  237 mL Oral TID BM   heparin   5,000 Units Subcutaneous Q8H   levETIRAcetam   500 mg Oral Once per day on Sunday Tuesday Thursday Saturday   levETIRAcetam   750 mg Oral Once per day on Monday Wednesday Friday   liver oil-zinc  oxide   Topical BID   multivitamin  1 tablet Oral QHS   nutrition supplement (JUVEN)  1 packet Oral BID BM   pregabalin   50 mg Oral Daily   sodium chloride  flush  3 mL Intravenous Q12H   sodium zirconium cyclosilicate   10 g Oral TID   zinc  sulfate (50mg  elemental zinc )  220 mg Oral Daily    Dialysis Orders: [facility] TTS 3 hr  edw 77kg,  LUE AVF   Heparin  1000 bolus + 400u/hr Mircera 100 mcg  q 2wks due   4/28 ( was no show)  No vitamin D   Assessment/Plan: Infected wound L foot: hx of R AKA for similar issues. --- S/p L AKA on 5/2  ESRD: on HD TTS.   HD 07/06/23 HTN/Vol: Stable BPs. Stable. No volume on exam. 3L of UF today Anemia:  S/p 1 unit PRBCs 5/6. Transfuse 07/06/23 1 U PRBC.  On Aranesp  100 q Thus -last on 5/8. Check Fe studies, now off IV abx.   Secondary hyperparathyroidism: CorrCa elevated -not on Vit D. Phos above goal on Ca acetate binder --follow trends.  Nutrition: optimize as able. Prot supp per RD.  Hyperkalemia: K 7.1 today. Lokelma  10 mg TID continued for this patient.  Seizure d/o - on keppra  DM2 - per pmd Diarrhea - +C. Diff and norovirus on admit. - s/p po vanc  FTT - poor po intake, sacral wound, recs for SNF    Hersey Lorenzo, NP [facility] 07/07/2023,1:14 PM

## 2023-07-07 NOTE — Progress Notes (Signed)
 Nutrition Follow-up  DOCUMENTATION CODES:   Non-severe (moderate) malnutrition in context of chronic illness  INTERVENTION:  Continue renal MVI w/ minerals Continue Nepro Shake po TID, each supplement provides 425 kcal and 19 grams protein  Continue probiotic for gut health/bowel regularity Continue 1 packet Juven BID, each packet provides 95 calories, 2.5 grams of protein (collagen), and 9.8 grams of carbohydrate (3 grams sugar); also contains 7 grams of L-arginine and L-glutamine, 300 mg vitamin C , 15 mg vitamin E, 1.2 mcg vitamin B-12, 9.5 mg zinc , 200 mg calcium , and 1.5 g  Calcium  Beta-hydroxy-Beta-methylbutyrate to support wound healing  Continue zinc  and vitamin C  for wound healing support   NUTRITION DIAGNOSIS:  Moderate Malnutrition related to chronic illness (ESRD-HD, hx R AKA, T2DM, CAD, HTN) as evidenced by mild fat depletion, moderate muscle depletion, percent weight loss.  GOAL:  Patient will meet greater than or equal to 90% of their needs  MONITOR:  PO intake, Supplement acceptance, Skin, I & O's  REASON FOR ASSESSMENT:  Consult Assessment of nutrition requirement/status  ASSESSMENT:   53 yo male admitted with FTT, infected ischemic wound of the L foot, syncope, hypoglycemia. PMH includes PAD, chronic ischemic L heel ulcer, R AKA, ESRD on HD TTS, CAD, CVA, HF, PAF, seizure D/O, DM-2, HTN, HLD, wheelchair bound, chronic diarrhea, gastroparesis, BPH.  4/29 admitted 5/2 L AKA, wound vac 5/4 rectal tube removed 5/14 wound vac removed, transfused  Patient with no significant complaints today. Stools becoming more formed. Auths pending for dispo/placement.    Average Meal Intake 5/12: 100% x1 documented meal 5/13: 40-100% x2 documented meals 5/14: 10-100% x4 documented meals 5/15: 100% x1 documented meals   Intake improved/adequate. Nepro continues and patient accepting, for the most part. Providing both calories and protein to promote wound healing. Thus, labs  have trended up. Diet modified back to renal appropriate w/ fluid restriction on 5/13.   Admit Weight: 75kg Current Weight: 69kg   He is anuric at baseline. 3L UF yesterday at dialysis. Did infiltrate on   Intake/Output Summary (Last 24 hours) at 07/07/2023 0949 Last data filed at 07/06/2023 1904 Gross per 24 hour  Intake 1562 ml  Output 6000 ml  Net -4438 ml    Drains/Lines: LUE AVF: + t/b   Probiotics continue.  Appears euvolemic. Corr Ca within range. Calcium -based binder continues. Reiterated importance of phosphorus binders with meals. Potassium significantly elevated despite Lokelma  x3 ordered. All labs trended up. Awaiting draw s/p dialysis tx yesterday. Corrected calcium  elevated. Calcium  based binder in place. Consider revising.   Meds: acidophilus, ascorbic acid , calcium  acetate, darbepoetin alfa , renal MVI, Juven, zinc    Labs reviewed from 5/14: Na+ 141 (wdl) K+ 6.1>7.3>7.1(wdl) Cca 11 (H) PHOS 8.5 (H) CBGs 78-90 x24 hours  Diet Order:   Diet Order             Diet renal with fluid restriction Fluid restriction: 1200 mL Fluid; Room service appropriate? Yes; Fluid consistency: Thin  Diet effective now            EDUCATION NEEDS:  Education needs have been addressed  Skin:  Skin Assessment: Skin Integrity Issues: Skin Integrity Issues:: Other (Comment) Other: infected ischemic wound L foot  Last BM:  5/14 - type 6 x1  Height:  Ht Readings from Last 1 Encounters:  06/24/23 6\' 3"  (1.905 m)   Weight:  Wt Readings from Last 1 Encounters:  07/06/23 69 kg   Ideal Body Weight:  82 kg  BMI:  Body mass  index is 19.01 kg/m.  Estimated Nutritional Needs:   Kcal:  2300-2500  Protein:  110-125 gm  Fluid:  1 L + UOP  Con Decant MS, RD, LDN Registered Dietitian Clinical Nutrition RD Inpatient Contact Info in Amion

## 2023-07-07 NOTE — Progress Notes (Signed)
 Received patient in bed to unit. Bay 7 Alert and oriented. X4 Informed consent signed and in chart. Yes   TX duration:3.25  Patient tolerated well.  Transported back to the room via bed Alert, without acute distress.  Hand-off given to patient's nurse. [name]  Access used: Left AVF Access issues: None  Total UF removed: None ( EVEN ) Medication(s) given: None Post HD weight: 68.8 kg Post HD VS: 142/67, 62 P, 12 Resp, 100% RA, BVP 77.9    [name] S [name] Bridgett RN, DNP [facility]

## 2023-07-07 NOTE — Progress Notes (Signed)
 Patient ID: [name] Porta., male   DOB: [birth year], 53 y.o.   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  Patient states his leg is still sore.

## 2023-07-07 NOTE — Progress Notes (Signed)
 Psychosocial Progressive/Outcome: ANOx4, cooperative and irritable   Pain/Comfort Progression/Outcome: Pt did not request PRN pain medication during shift   Clinical Progression/Outcome: Poor diet an fluid intake  Encouraged pt to turn and reposition during shift  No BM Dressing changed - D/C/I Slept in between care Maintained safety

## 2023-07-07 NOTE — Progress Notes (Signed)
 OT Cancellation Note  Patient Details Name: [name]. MRN: [id number] DOB: May 24, 1970   Cancelled Treatment:    Reason Eval/Treat Not Completed: (P) Patient declined, no reason specified, has HD soon, Pt tired and refusing, will return tomorrow.  Scherry Curtis 07/07/2023, 12:53 PM

## 2023-07-07 NOTE — Plan of Care (Signed)
    Problem: Clinical Measurements: Goal: Will remain free from infection Outcome: Progressing Goal: Respiratory complications will improve Outcome: Progressing Goal: Cardiovascular complication will be avoided Outcome: Progressing   Problem: Nutrition: Goal: Adequate nutrition will be maintained Outcome: Progressing   Problem: Health Behavior/Discharge Planning: Goal: Ability to manage health-related needs will improve Outcome: Not Progressing   Problem: Clinical Measurements: Goal: Ability to maintain clinical measurements within normal limits will improve Outcome: Not Progressing Goal: Diagnostic test results will improve Outcome: Not Progressing   Problem: Activity: Goal: Risk for activity intolerance will decrease Outcome: Not Progressing  Ate more today. Stools have decreased. Blood transfusion at present. Resistant to care.

## 2023-07-07 NOTE — Progress Notes (Signed)
 Progress Note   Patient: [name] Livesay Jr. 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 He was found to have an infected ischemic wound of the left foot, eventually underwent left AKA on 5/2   Assessment and Plan: Principal problem Syncopal episode, hypoglycemia -slumped over and unconscious in PCPs office.  This is likely due to poor p.o. intake - Encourage p.o. intake, no hypoglycemia since 5/8 and if fed he is eating well - Cortisol adequate at 11.1 at around 10 AM, no adrenal insufficiency.  TSH unremarkable   Active problems Infected ischemic ulcer of the left heel - History of gangrenous wounds on the right and has ultimately had a right AKA.  On admission he was found to have an infected ulcer of the left heel, orthopedic surgery consulted, he is now status post left AKA on 5/2.  Initially he was maintained on broad-spectrum antibiotics, now discontinued followed by amputation.  Wound VAC removed 5/8, however has some retained products that are now dried over and removal of wound vac remnants is very difficult -Wound vac removed 5/14 by Dr. Julio Ohm. Wound not discharge with wound vac   Diarrhea -positive for C. difficile and norovirus on admission.  Has completed a course of p.o. vancomycin  from 4/29 until 5/9.  Required rectal tube initially, now removed on 5/4 - Improving. Still having loose stool, however is noted to be soft, not watery   Hyperkalemia-potassium significantly up at 7 on 5/13, -Underwent HD 5/14. Improved   Fever-100.9 on 5/10, no fever since.    Failure to thrive - Progressive weakness now  wheelchair-bound and using a [name]; despite recent SNF stay. diminished oral intake only taking 1 meal per day. - Nutrition consulted - PT/OT consulted, recs for SNF, placement pending, unfortunately insurance denied, but eventually approved upon appeal.  He is stable to go when his hyperkalemia is resolved and nephrology is clearing him for discharge   Troponin elevation - No history of chest pain. High - sensitivity troponin low level elevated and flat 28 -> 30 which is consistent with demand ischemia, also underlying ESRD.  - Continue aspirin , statin, Coreg    Sacral wound - WOC consulted per above   ESRD,Tu/Th/Sat HD -Nephrology consulted for HD.    CAD history STEMI / Hx CVA - Continue home aspirin , atorvastatin  80 mg,  Coreg  12.5 mg twice daily   HFpEF -  Without acute exacerbation.  Not on diuretics   Paroxysmal A-fib off anticoagulation - currently in sinus rhythm, not on anticoagulation.  Continue home amiodarone , coreg  per above.    Hx IPH - Noted, off AC.    Seizure disorder - Continue home Keppra  500 mg nondialysis days, 750 mg on HD days.   DM type II diet controlled - Recent hypoglycemia, not on diabetic medications.    Hypertension - continue home antihypertensives, pressures acceptable   Chronic anemia - Hx anemia CKD, received 3 units PRBC's this visit   Enucleation OS, hx DM retinopathy, glaucoma - Noted, ophtho f/u OP.       Subjective: Without complaints when seen  Physical Exam: Vitals:   07/07/23 1324 07/07/23 1330 07/07/23 1400  07/07/23 1430  BP: 112/67 102/64 101/61 113/67  Pulse: 63 (!) 59 63   Resp: 16 17 11    Temp:      TempSrc:      SpO2: 100% 100% 100%   Weight:      Height:       General exam: Conversant, in no acute distress Respiratory system: normal chest rise, clear, no audible wheezing Cardiovascular system: regular rhythm, s1-s2 Gastrointestinal system: Nondistended, nontender, pos BS Central nervous system: No seizures, no  tremors Extremities: No cyanosis, no joint deformities Skin: No rashes, no pallor Psychiatry: Affect normal // no auditory hallucinations   Data Reviewed:  Labs reviewed: Na 140, K 5.3, Hgb 8.0  Family Communication: Pt in room, family not at bedside  Disposition: Status is: Inpatient Remains inpatient appropriate because: severity of illness  Planned Discharge Destination: Skilled nursing facility    Author: Cherylle Corwin, MD 07/07/2023 3:36 PM  For on call review [email or web address].

## 2023-07-08 ENCOUNTER — Other Ambulatory Visit (HOSPITAL_COMMUNITY): Payer: Self-pay

## 2023-07-08 ENCOUNTER — Encounter (HOSPITAL_COMMUNITY): Payer: Self-pay | Admitting: Internal Medicine

## 2023-07-08 DIAGNOSIS — F32A Depression, unspecified: Secondary | ICD-10-CM | POA: Diagnosis not present

## 2023-07-08 DIAGNOSIS — R627 Adult failure to thrive: Secondary | ICD-10-CM | POA: Diagnosis not present

## 2023-07-08 DIAGNOSIS — R630 Anorexia: Secondary | ICD-10-CM | POA: Diagnosis not present

## 2023-07-08 LAB — CBC
HCT: 26.8 % — ABNORMAL LOW (ref 39.0–52.0)
Hemoglobin: 8.1 g/dL — ABNORMAL LOW (ref 13.0–17.0)
MCH: 23.1 pg — ABNORMAL LOW (ref 26.0–34.0)
MCHC: 30.2 g/dL (ref 30.0–36.0)
MCV: 76.4 fL — ABNORMAL LOW (ref 80.0–100.0)
Platelets: 216 10*3/uL (ref 150–400)
RBC: 3.51 MIL/uL — ABNORMAL LOW (ref 4.22–5.81)
RDW: 23.7 % — ABNORMAL HIGH (ref 11.5–15.5)
WBC: 9 10*3/uL (ref 4.0–10.5)
nRBC: 0 % (ref 0.0–0.2)

## 2023-07-08 LAB — COMPREHENSIVE METABOLIC PANEL WITH GFR
ALT: 37 U/L (ref 0–44)
AST: 19 U/L (ref 15–41)
Albumin: 2.3 g/dL — ABNORMAL LOW (ref 3.5–5.0)
Alkaline Phosphatase: 73 U/L (ref 38–126)
Anion gap: 13 (ref 5–15)
BUN: 68 mg/dL — ABNORMAL HIGH (ref 6–20)
CO2: 25 mmol/L (ref 22–32)
Calcium: 8.9 mg/dL (ref 8.9–10.3)
Chloride: 98 mmol/L (ref 98–111)
Creatinine, Ser: 5.97 mg/dL — ABNORMAL HIGH (ref 0.61–1.24)
GFR, Estimated: 10 mL/min — ABNORMAL LOW (ref >60)
Glucose, Bld: 77 mg/dL (ref 70–99)
Potassium: 4.3 mmol/L (ref 3.5–5.1)
Sodium: 136 mmol/L (ref 135–145)
Total Bilirubin: 0.8 mg/dL (ref 0.0–1.2)
Total Protein: 6 g/dL — ABNORMAL LOW (ref 6.5–8.1)

## 2023-07-08 MED ORDER — SODIUM ZIRCONIUM CYCLOSILICATE 10 G PO PACK: 10.0000 g | PACK | Freq: Three times a day (TID) | ORAL | 0 refills | Status: DC

## 2023-07-08 MED ORDER — ACIDOPHILUS PO CAPS
2.0000 | ORAL_CAPSULE | Freq: Three times a day (TID) | ORAL | 0 refills | Status: DC
  Filled 2023-07-08 (×2): qty 180, 30d supply, fill #0

## 2023-07-08 MED ORDER — OXYCODONE HCL 5 MG PO TABS: 5.0000 mg | ORAL_TABLET | ORAL | 0 refills | Status: DC | PRN

## 2023-07-08 MED ORDER — PREGABALIN 50 MG PO CAPS: 50.0000 mg | ORAL_CAPSULE | Freq: Every day | ORAL | 0 refills | Status: DC

## 2023-07-08 NOTE — Progress Notes (Signed)
 [facility] Progress Note   Subjective:    Patient seen in his room this morning. He denies any dyspnea or CP. He was in bed. Plan is to d/c this patient to SNF today. K is much improved today at 4.3. BP stable but on softer side.    Physical Exam General: Alert, oriented, nad Heart: RRR Lungs: Clear, normal wob Abdomen: Soft, non-distended Extremities: R AKA, L stump w/wound vac  Dialysis Access: LUE AVF + t/b, infiltration today and has swelling above access.   Filed Weights   07/06/23 0753 07/06/23 1127 07/07/23 1309  Weight: 72 kg 69 kg 68.8 kg    Intake/Output Summary (Last 24 hours) at 07/08/2023 1343 Last data filed at 07/07/2023 1649 Gross per 24 hour  Intake --  Output 0 ml  Net 0 ml    Additional Objective Labs: Basic Metabolic Panel: Recent Labs  Lab 07/02/23 0800 07/03/23 0458 07/04/23 0907 07/06/23 0757 07/07/23 0830 07/08/23 0637  NA 142 140   < > 141 140 136  K 5.4* 5.3*   < > 7.1* 5.3* 4.3  CL 106 105   < > 107 101 98  CO2 20* 21*   < > 20* 23 25  GLUCOSE 93 92   < > 78 125* 77  BUN 111* 78*   < > 146* 109* 68*  CREATININE 10.38* 7.57*   < > 11.08* 8.56* 5.97*  CALCIUM  9.5 9.3   < > 9.3 9.5 8.9  PHOS 7.1* 5.7*  --  10.6*  --   --    < > = values in this interval not displayed.   Liver Function Tests: Recent Labs  Lab 07/04/23 0907 07/06/23 0757 07/08/23 0637  AST 25  --  19  ALT 54*  --  37  ALKPHOS 65  --  73  BILITOT 0.4  --  0.8  PROT 6.0*  --  6.0*  ALBUMIN  2.2* 1.9* 2.3*   No results for input(s): "LIPASE", "AMYLASE" in the last 168 hours. CBC: Recent Labs  Lab 07/02/23 0800 07/03/23 0458 07/04/23 0907 07/05/23 0519 07/06/23 0757 07/07/23 0500 07/08/23 0637  WBC 11.3* 11.5* 11.3* 10.9* 10.2  --  9.0  NEUTROABS 7.4  --   --   --   --   --   --   HGB 7.0* 7.6* 7.8* 7.2* 6.6* 8.0* 8.1*  HCT 22.9* 24.8* 26.1* 23.5* 22.1* 26.1* 26.8*  MCV 74.6* 74.7* 77.7* 74.8* 76.2*  --  76.4*  PLT 222 234 231 240 244  --   216   Blood Culture    Component Value Date/Time   SDES BLOOD RIGHT ARM 06/25/2023 0911   SPECREQUEST  06/25/2023 0911    BOTTLES DRAWN AEROBIC AND ANAEROBIC Blood Culture adequate volume   CULT  06/25/2023 0911    NO GROWTH 5 DAYS Performed at [facility], [address] [place], [place] [address]    REPTSTATUS 06/30/2023 FINAL 06/25/2023 0911     CBG: Recent Labs  Lab 07/02/23 2043 07/03/23 2110 07/04/23 1155 07/05/23 1224 07/05/23 1636  GLUCAP 75 120* 106* 73 78   Iron Studies:  No results for input(s): "IRON", "TIBC", "TRANSFERRIN", "FERRITIN" in the last 72 hours.  Lab Results  Component Value Date   INR 1.2 05/06/2023   INR 1.2 05/05/2023   INR 1.1 03/01/2022   Studies/Results: No results found.  Medications:  sodium chloride  10 mL/hr at 07/06/23 1904    acidophilus  2 capsule Oral TID  amiodarone   200 mg Oral Daily   vitamin C   1,000 mg Oral Daily   aspirin  EC  81 mg Oral Q breakfast   atorvastatin   80 mg Oral QHS   calcium  acetate  1,334 mg Oral TID WC   carvedilol   25 mg Oral BID   Chlorhexidine  Gluconate Cloth  6 each Topical Q0600   darbepoetin (ARANESP ) injection - NON-DIALYSIS  100 mcg Subcutaneous Q Thu-1800   feeding supplement (NEPRO CARB STEADY)  237 mL Oral TID BM   heparin   5,000 Units Subcutaneous Q8H   levETIRAcetam   500 mg Oral Once per day on Sunday Tuesday Thursday Saturday   levETIRAcetam   750 mg Oral Once per day on Monday Wednesday Friday   liver oil-zinc  oxide   Topical BID   multivitamin  1 tablet Oral QHS   nutrition supplement (JUVEN)  1 packet Oral BID BM   pregabalin   50 mg Oral Daily   sodium chloride  flush  3 mL Intravenous Q12H   sodium zirconium cyclosilicate   10 g Oral TID    Dialysis Orders: [facility] TTS 3 hr  edw 77kg,  LUE AVF   Heparin  1000 bolus + 400u/hr Mircera 100 mcg  q 2wks due  4/28 ( was no show)  No vitamin D   Assessment/Plan: Infected wound L foot: hx of R AKA for similar  issues. --- S/p L AKA on 5/2  ESRD: on HD TTS.   Last HD 07/07/23 HTN/Vol: Stable BPs. Stable. No volume on exam.  Anemia:  S/p 1 unit PRBCs 5/6. Transfuse 07/06/23 1 U PRBC.  On Aranesp  100 q Thus -last on 5/8. Check Fe studies, now off IV abx.   Secondary hyperparathyroidism: CorrCa elevated -not on Vit D. Phos above goal on Ca acetate binder --follow trends.  Nutrition: optimize as able. Prot supp per RD.  Hyperkalemia: K 4.3 today. Lokelma  10 mg TID continued for this patient.  Seizure d/o - on keppra  DM2 - per pmd Diarrhea - +C. Diff and norovirus on admit. - s/p po vanc  FTT - poor po intake, sacral wound, recs for SNF    [name] Lorenzo, NP [facility] 07/08/2023,1:43 PM

## 2023-07-08 NOTE — Progress Notes (Signed)
 Pt to d/c to snf today. Contacted [facility] to be advised of pt's d/c today to snf and that pt should resume care tomorrow. SNF name provided to clinic staff. D/C summary and yesterday's renal note faxed to clinic for continuation of care. HD arrangements placed on pt's AVS and provided to CSW. Case discussed with nephrologist and renal NP who do not have concerns of pt being able to sit for HD. They feel pt is appropriate for out-pt HD at d/c. Requested that CSW please advise snf to please send a hoyer pad/sling under pt in wheelchair to HD appts. This info was placed on AVS as well.   Lauraine Polite Renal Navigator [phone]

## 2023-07-08 NOTE — TOC Transition Note (Signed)
 Transition of Care [facility]) - Discharge Note   Patient Details  Name: [name]. MRN: [id number] Date of Birth: [birth year]  Transition of Care [facility]) CM/SW Contact:  Jeffory Mings, [place] Phone Number: 07/08/2023, 1:30 PM   Clinical Narrative: Pt for dc to [facility] today. Spoke to [place] at [facility] who confirmed they are prepared to admit pt to room B22-1. [name] [name] aware of dc and reports agreeable. RN provided with number for report and PTAR arranged for transport. SW signing off at dc.   Paullette Boston, MSW, LCSW [phone] (coverage)        Final next level of care: Skilled Nursing Facility Barriers to Discharge: Barriers Resolved   Patient Goals and CMS Choice Patient states their goals for this hospitalization and ongoing recovery are:: Rehab CMS Medicare.gov Compare Post Acute Care list provided to:: Patient Choice offered to / list presented to : Patient [place] ownership interest in [facility].provided to:: Patient    Discharge Placement              Patient chooses bed at: Other - please specify in the comment section below: [facility]) Patient to be transferred to facility by: PTAR Name of family member notified: [name]/wife Patient and family notified of of transfer: 07/08/23  Discharge Plan and Services Additional resources added to the After Visit Summary for   In-house Referral: Clinical Social Work   Post Acute Care Choice: Skilled Nursing Facility                               Social Drivers of Health (SDOH) Interventions SDOH Screenings   Food Insecurity: No Food Insecurity (06/21/2023)  Housing: High Risk (05/22/2023)  Transportation Needs: No Transportation Needs (05/22/2023)  Utilities: Patient Declined (06/21/2023)  Financial Resource Strain: High Risk (07/05/2017)  Physical Activity: Inactive (07/05/2017)  Social Connections: Socially Integrated (05/05/2023)  Stress: Stress Concern Present  (07/05/2017)  Tobacco Use: Low Risk  (07/08/2023)  Health Literacy: Low Risk  (06/01/2020)   Received from [facility], [facility]     Readmission Risk Interventions    06/25/2023    9:22 AM 06/21/2023   10:51 AM 05/17/2023    3:44 PM  Readmission Risk Prevention Plan  Transportation Screening Complete Complete Complete  Medication Review (RN Care Manager) Complete Complete Referral to Pharmacy  PCP or Specialist appointment within 3-5 days of discharge Complete  Complete  HRI or Home Care Consult Complete Complete Complete  SW Recovery Care/Counseling Consult Complete Complete Complete  Palliative Care Screening Not Applicable Not Applicable Not Applicable  Skilled Nursing Facility Complete Not Applicable Not Applicable

## 2023-07-08 NOTE — Progress Notes (Signed)
 Pt left with all belongings and paperwork was given to PTAR

## 2023-07-08 NOTE — Discharge Summary (Signed)
 Physician Discharge Summary   Patient: [name]. MRN: [id number] DOB: Aug 12, 1970  Admit date:     06/20/2023  Discharge date: 07/08/23  Discharge Physician: Cherylle Corwin   PCP: Ayesha Lente, FNP   Recommendations at discharge:    Follow up with PCP in 1-2 weeks Follow up with Orthopedic Surgery as scheduled  Discharge Diagnoses: Active Problems:   C. difficile colitis   Syncope   Hypoglycemia   Ischemic ulcer of left heel (HCC)   Failure to thrive in adult   Gangrene of left foot (HCC)   Malnutrition of moderate degree  Resolved Problems:   * No resolved hospital problems. *  Hospital Course: 53 y.o. 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 Incidentally noted to have infected ischemic wound of the left foot.   Assessment and Plan: Principal problem Syncopal episode, hypoglycemia -slumped over and unconscious in PCPs office.  This is likely due to poor p.o. intake - Encourage p.o. intake, no hypoglycemia since 5/8 and if fed he is eating well - Cortisol adequate at 11.1 at around 10 AM, no adrenal insufficiency.  TSH unremarkable   Active problems Infected ischemic ulcer of the left heel - History of gangrenous wounds on the right and has ultimately had a right AKA.  On admission he was found to have an infected ulcer of the left heel, orthopedic surgery consulted, he is now status post left AKA on 5/2.  Initially he was maintained on broad-spectrum antibiotics, now discontinued followed by amputation.   Wound VAC removed 5/8, however has some retained products that are now dried over and removal of wound vac remnants is very difficult -Wound vac removed 5/14 by Dr. Julio Ohm. Wound not discharge with wound vac   Diarrhea -positive for C. difficile and norovirus on admission.  Has completed a course of p.o. vancomycin  from 4/29 until 5/9.  Required rectal tube initially, now removed on 5/4 - Improving. Only one BM documented yesterday. Stools formed   Hyperkalemia- -resolved with HD and lokelma  -Discussed with Nephrology. Recs to continue TID lokelma  and Nephrology will re-evaluate as outpatient   Fever-100.9 on 5/10, no fever since.    Failure to thrive - Progressive weakness now wheelchair-bound and using a [name]; despite recent SNF stay. diminished oral intake only taking 1 meal per day. - Nutrition consulted - PT/OT consulted, recs for SNF   Troponin elevation - No history of chest pain. High - sensitivity troponin low level elevated and flat 28 -> 30 which is consistent with demand ischemia, also underlying ESRD.  - Continue aspirin , statin, Coreg    Sacral wound - WOC consulted per above   ESRD,Tu/Th/Sat HD -Nephrology consulted for HD.    CAD history STEMI / Hx CVA - Continue home aspirin , atorvastatin  80 mg,  Coreg  12.5 mg twice daily   HFpEF -  Without acute exacerbation.  Not on diuretics   Paroxysmal A-fib off anticoagulation - currently in sinus rhythm, not on anticoagulation.  Continue home amiodarone , coreg  per above.    Hx IPH - Noted, off AC.    Seizure disorder -  Continue home Keppra  500 mg nondialysis days, 750 mg on HD days.   DM type II diet controlled - Recent hypoglycemia, not on diabetic medications.    Hypertension - continue home antihypertensives, pressures acceptable   Chronic anemia - Hx anemia CKD, received 3 units PRBC's this visit   Enucleation OS, hx DM retinopathy, glaucoma - Noted, recommend ophtho f/u OP.        Consultants: Nephrology,  Orthopedic Surgery Procedures performed: AMPUTATION, ABOVE KNEE, left.   Disposition: Skilled nursing facility Diet recommendation:  Renal diet DISCHARGE MEDICATION: Allergies as of 07/08/2023       Reactions   Promethazine  Diarrhea, Other (See Comments)   Muscle cramps, cramping "all over"        Medication List     STOP taking these medications    polyethylene glycol 17 g packet Commonly known as: MIRALAX  / GLYCOLAX        TAKE these medications    (feeding supplement) PROSource Plus liquid Take 30 mLs by mouth 3 (three) times daily between meals.   acetaminophen  325 MG tablet Commonly known as: TYLENOL  Take 2 tablets (650 mg total) by mouth every 6 (six) hours as needed for mild pain (pain score 1-3) or fever (or Fever >/= 101).   acidophilus Caps capsule Take 2 capsules by mouth 3 (three) times daily.   amiodarone  200 MG tablet Commonly known as: PACERONE  Take 1 tablet (200 mg total) by mouth daily.   Amlactin Daily 12 % lotion Generic drug: ammonium lactate  Apply topically.   ascorbic acid  500 MG tablet Commonly known as: VITAMIN C  Take 1,000 mg by mouth daily.   aspirin  EC 81 MG tablet Take 1 tablet (81 mg total) by mouth daily with breakfast. Swallow whole.   atorvastatin  80 MG tablet Commonly known as: LIPITOR  Take 1 tablet (80 mg total) by mouth at bedtime.   brimonidine  0.2 % ophthalmic solution Commonly known as: ALPHAGAN  Place 1 drop into the right eye 3 (three) times daily. What changed: when to take this   calcitRIOL  0.25 MCG capsule Commonly known as: ROCALTROL  Take 9 capsules (2.25 mcg total) by mouth every Tuesday, Thursday, and Saturday at 6 PM. What changed:  how much to take when to take this additional instructions   calcium  acetate 667 MG tablet Commonly known as: PHOSLO  Take 1,334 mg by mouth 3 (three) times daily.   carvedilol  12.5 MG tablet Commonly known as: COREG  Take 1 tablet (12.5 mg total) by mouth 2 (two) times  daily.   cinacalcet  30 MG tablet Commonly known as: SENSIPAR  Take 2 tablets (60 mg total) by mouth every Tuesday, Thursday, and Saturday at 6 PM.   Desitin Maximum Strength 40 % Pste Generic drug: Zinc  Oxide Apply 1 Application topically 2 (two) times daily as needed.   dorzolamide -timolol  2-0.5 % ophthalmic solution Commonly known as: COSOPT  Place 1 drop into the right eye 2 (two) times daily.   levETIRAcetam  500 MG tablet Commonly known as: KEPPRA  Take 1 tablet (500 mg total) by mouth 4 (four) times a week. On Sunday, Monday, Wednesday and Friday, all non-dialysis days   levETIRAcetam  750 MG tablet Commonly known as: KEPPRA  Take 1 tablet (750 mg total) by mouth 3 (three) times a week. On Tuesday Thursday and Saturday, on dialysis days   lidocaine -prilocaine  cream Commonly known as: EMLA  Apply 1 Application topically 3 (three) times a week.   loperamide  2 MG capsule Commonly known as: IMODIUM  Take 1 capsule (2 mg total) by mouth as needed for  diarrhea or loose stools.   multivitamin Tabs tablet Take 1 tablet by mouth at bedtime.   ondansetron  4 MG tablet Commonly known as: ZOFRAN  Take 1 tablet (4 mg total) by mouth every 6 (six) hours as needed for nausea or vomiting.   oxyCODONE  5 MG immediate release tablet Commonly known as: Oxy IR/ROXICODONE  Take 1 tablet (5 mg total) by mouth every 4 (four) hours as needed for moderate pain (pain score 4-6).   pregabalin  50 MG capsule Commonly known as: LYRICA  Take 1 capsule (50 mg total) by mouth daily. Start taking on: Jul 09, 2023   Pro-Stat [facility] Liqd Take 30 mLs by mouth every morning.   sodium zirconium cyclosilicate  10 g Pack packet Commonly known as: LOKELMA  Take 10 g by mouth 3 (three) times daily.               Discharge Care Instructions  (From admission, onward)           Start     Ordered   06/27/23 0000  Change dressing       Comments: At time of discharge remove the wound VAC dressing on the  left above-knee amputation and apply a dry dressing.   06/27/23 0739            Contact information for follow-up providers     Timothy Ford, MD Follow up.   Specialty: Orthopedic Surgery Contact information: [address] [place] [address] [phone]              Contact information for after-discharge care     Destination     [facility] .   Service: Skilled Nursing Contact information: [address] [place]  [address] [phone]                    Discharge Exam: Cleavon Curls Weights   07/06/23 0753 07/06/23 1127 07/07/23 1309  Weight: 72 kg 69 kg 68.8 kg   General exam: Awake, laying in bed, in nad Respiratory system: Normal respiratory effort, no wheezing Cardiovascular system: regular rate, s1, s2 Gastrointestinal system: Soft, nondistended, positive BS Central nervous system: CN2-12 grossly intact, strength intact Extremities: Perfused, no clubbing Skin: Normal skin turgor, no notable skin lesions seen Psychiatry: Mood normal // no visual hallucinations   Condition at discharge: fair  The results of significant diagnostics from this hospitalization (including imaging, microbiology, ancillary and laboratory) are listed below for reference.   Imaging Studies: DG CHEST PORT 1 VIEW Result Date: 06/25/2023 CLINICAL DATA:  Fever EXAM: PORTABLE CHEST 1 VIEW COMPARISON:  06/16/2023 FINDINGS: Stable right basilar parenchymal scarring. Persistent right basilar consolidation and small right pleural effusion, possibly representing a parapneumonic effusion. Left lung is clear. No pneumothorax. No pleural effusion on the left. Cardiac size is within normal limits. No acute bone abnormality. IMPRESSION: 1. Persistent right basilar consolidation and small right pleural effusion, possibly representing a parapneumonic effusion. Electronically Signed   By: Worthy Heads M.D.   On: 06/25/2023 11:46    DG Foot Complete Left Result Date: 06/20/2023 CLINICAL DATA:  Wound check. Abscess. Wound to the bottom of the left foot. EXAM: LEFT FOOT - COMPLETE 3+ VIEW COMPARISON:  MRI left foot 04/04/2023. Left foot radiographs 05/25/2022 FINDINGS: Previous amputation of the left first toe at the metatarsal-phalangeal level. Postoperative changes versus resorption at the articular surface of the second metatarsal-phalangeal joint. Similar appearance to previous study. Diffuse bone demineralization. Degenerative changes in the interphalangeal and intertarsal  joints. No evidence of acute fracture or dislocation. No focal bone lesion or new bone erosion. Extensive vascular calcifications. No radiopaque soft tissue foreign bodies or soft tissue gas identified. IMPRESSION: 1. Chronic bone changes as discussed above. No acute displaced fractures identified. No acute bone erosion suggested. 2. Extensive soft tissue calcification. No radiopaque foreign bodies or soft tissue gas. Electronically Signed   By: Boyce Byes M.D.   On: 06/20/2023 18:31   CT Head Wo Contrast Result Date: 06/16/2023 CLINICAL DATA:  Seizure disorder, clinical change EXAM: CT HEAD WITHOUT CONTRAST TECHNIQUE: Contiguous axial images were obtained from the base of the skull through the vertex without intravenous contrast. RADIATION DOSE REDUCTION: This exam was performed according to the departmental dose-optimization program which includes automated exposure control, adjustment of the mA and/or kV according to patient size and/or use of iterative reconstruction technique. COMPARISON:  05/14/2023 FINDINGS: Brain: No evidence of acute infarction, hemorrhage, hydrocephalus, extra-axial collection or mass lesion/mass effect. Infarcts along the superior frontal convexities are unchanged. Extensive low-density in the cerebral white matter attributed to chronic small vessel disease with chronic lacunar infarcts at the bilateral thalamus. Cerebral volume  loss with ventriculomegaly. Vascular: No hyperdense vessel. Confluent calcification of medium and small vessels. Skull: No acute finding Sinuses/Orbits: Left enucleation with prosthesis. Partial bilateral mastoid opacification with normal nasopharynx. IMPRESSION: Premature atrophy with advanced chronic ischemia. No acute or interval finding. Electronically Signed   By: Ronnette Coke M.D.   On: 06/16/2023 10:24   DG Chest Portable 1 View Result Date: 06/16/2023 CLINICAL DATA:  Altered mental status EXAM: PORTABLE CHEST 1 VIEW COMPARISON:  May 22, 2023 FINDINGS: Subtle residual right basilar infiltrates and atelectasis similar to prior examination may represent chronic changes with right pleural reaction. No acute cardiopulmonary infiltrates Heart and mediastinum normal IMPRESSION: Subtle residual right basilar infiltrates and atelectasis similar to prior examination may represent chronic changes with right pleural reaction. Electronically Signed   By: Fredrich Jefferson M.D.   On: 06/16/2023 10:10    Microbiology: Results for orders placed or performed during the hospital encounter of 06/20/23  Gastrointestinal Panel by PCR , Stool     Status: Abnormal   Collection Time: 06/21/23  3:56 AM   Specimen: Stool  Result Value Ref Range Status   Campylobacter species NOT DETECTED NOT DETECTED Final   Plesimonas shigelloides NOT DETECTED NOT DETECTED Final   Salmonella species NOT DETECTED NOT DETECTED Final   Yersinia enterocolitica NOT DETECTED NOT DETECTED Final   Vibrio species NOT DETECTED NOT DETECTED Final   Vibrio cholerae NOT DETECTED NOT DETECTED Final   Enteroaggregative E coli (EAEC) NOT DETECTED NOT DETECTED Final   Enteropathogenic E coli (EPEC) NOT DETECTED NOT DETECTED Final   Enterotoxigenic E coli (ETEC) NOT DETECTED NOT DETECTED Final   Shiga like toxin producing E coli (STEC) NOT DETECTED NOT DETECTED Final   Shigella/Enteroinvasive E coli (EIEC) NOT DETECTED NOT DETECTED Final    Cryptosporidium NOT DETECTED NOT DETECTED Final   Cyclospora cayetanensis NOT DETECTED NOT DETECTED Final   Entamoeba histolytica NOT DETECTED NOT DETECTED Final   Giardia lamblia NOT DETECTED NOT DETECTED Final   Adenovirus F40/41 NOT DETECTED NOT DETECTED Final   Astrovirus NOT DETECTED NOT DETECTED Final   Norovirus GI/GII DETECTED (A) NOT DETECTED Final    Comment: RESULT CALLED TO, READ BACK BY AND VERIFIED WITH: TRAVIS ROSS RN @0229  06/22/23 ASW    Rotavirus A NOT DETECTED NOT DETECTED Final   Sapovirus (I, II, IV, and V)  NOT DETECTED NOT DETECTED Final    Comment: Performed at [facility], [address] [place], [place] [address]  C Difficile Quick Screen w PCR reflex     Status: Abnormal   Collection Time: 06/21/23  3:56 AM   Specimen: STOOL  Result Value Ref Range Status   C Diff antigen POSITIVE (A) NEGATIVE Final   C Diff toxin NEGATIVE NEGATIVE Final   C Diff interpretation Results are indeterminate. See PCR results.  Final    Comment: Performed at [facility], [address], [place], [place] [address]  C. Diff by PCR, Reflexed     Status: Abnormal   Collection Time: 06/21/23  3:56 AM  Result Value Ref Range Status   Toxigenic C. Difficile by PCR POSITIVE (A) NEGATIVE Final    Comment: Positive for toxigenic C. difficile with little to no toxin production. Only treat if clinical presentation suggests symptomatic illness. Performed at [facility], [address], [place], [place] [address]   Surgical pcr screen     Status: None   Collection Time: 06/24/23 12:03 PM   Specimen: Nasal Mucosa; Nasal Swab  Result Value Ref Range Status   MRSA, PCR NEGATIVE NEGATIVE Final   Staphylococcus aureus NEGATIVE NEGATIVE Final    Comment: (NOTE) The Xpert SA Assay (FDA approved for NASAL specimens in patients 45 years of age and older), is one component of a comprehensive surveillance program. It is not intended to diagnose infection nor to guide or monitor  treatment. Performed at [facility], [address] [place], [place] [address]   Culture, blood (Routine X 2) w Reflex to ID Panel     Status: None   Collection Time: 06/25/23  9:10 AM   Specimen: BLOOD RIGHT ARM  Result Value Ref Range Status   Specimen Description BLOOD RIGHT ARM  Final   Special Requests   Final    BOTTLES DRAWN AEROBIC AND ANAEROBIC Blood Culture adequate volume   Culture   Final    NO GROWTH 5 DAYS Performed at [facility], [address], [place], [place] [address]    Report Status 06/30/2023 FINAL  Final  Culture, blood (Routine X 2) w Reflex to ID Panel     Status: None   Collection Time: 06/25/23  9:11 AM   Specimen: BLOOD RIGHT ARM  Result Value Ref Range Status   Specimen Description BLOOD RIGHT ARM  Final   Special Requests   Final    BOTTLES DRAWN AEROBIC AND ANAEROBIC Blood Culture adequate volume   Culture   Final    NO GROWTH 5 DAYS Performed at [facility], [address] [place], [place] [address]    Report Status 06/30/2023 FINAL  Final    Labs: CBC: Recent Labs  Lab 07/02/23 0800 07/03/23 0458 07/04/23 0907 07/05/23 0519 07/06/23 0757 07/07/23 0500 07/08/23 0637  WBC 11.3* 11.5* 11.3* 10.9* 10.2  --  9.0  NEUTROABS 7.4  --   --   --   --   --   --   HGB 7.0* 7.6* 7.8* 7.2* 6.6* 8.0* 8.1*  HCT 22.9* 24.8* 26.1* 23.5* 22.1* 26.1* 26.8*  MCV 74.6* 74.7* 77.7* 74.8* 76.2*  --  76.4*  PLT 222 234 231 240 244  --  216   Basic Metabolic Panel: Recent Labs  Lab 07/02/23 0800 07/03/23 0458 07/04/23 0907 07/05/23 0519 07/06/23 0757 07/07/23 0830 07/08/23 0637  NA 142 140 141 142 141 140  136  K 5.4* 5.3* 6.1* 7.3* 7.1* 5.3* 4.3  CL 106 105 108 108 107 101 98  CO2 20* 21* 19* 19* 20* 23 25  GLUCOSE 93 92 105* 90 78 125* 77  BUN 111* 78* 105* 132* 146* 109* 68*  CREATININE 10.38* 7.57* 9.54* 10.72* 11.08* 8.56* 5.97*  CALCIUM  9.5 9.3 9.4 9.3 9.3 9.5 8.9  MG  --   --  2.7*  --   --   --   --   PHOS 7.1*  5.7*  --   --  10.6*  --   --    Liver Function Tests: Recent Labs  Lab 07/02/23 0800 07/03/23 0458 07/04/23 0907 07/06/23 0757 07/08/23 0637  AST  --   --  25  --  19  ALT  --   --  54*  --  37  ALKPHOS  --   --  65  --  73  BILITOT  --   --  0.4  --  0.8  PROT  --   --  6.0*  --  6.0*  ALBUMIN  2.1* 2.1* 2.2* 1.9* 2.3*   CBG: Recent Labs  Lab 07/02/23 2043 07/03/23 2110 07/04/23 1155 07/05/23 1224 07/05/23 1636  GLUCAP 75 120* 106* 73 78    Discharge time spent: less than 30 minutes.  Signed: Cherylle Corwin, MD [facility] 07/08/2023

## 2023-07-08 NOTE — TOC Progression Note (Signed)
 Transition of Care (TOC) - Progression Note    Patient Details  Name: [name]. MRN: [id number] Date of Birth: 1971/01/12  [facility]) CM/SW Contact  Paullette Boston [place], [place] Phone Number: 07/08/2023, 10:19 AM  Clinical Narrative: Per MD, pt medically stable for dc today. Spoke to [place] at [facility] who confirmed they are able to accept pt today after they make some bed assignment changes. [name] auth valid through today with grace period. Will need new auth if pt does not dc today. MD updated.   Paullette Boston, MSW, LCSW [phone] (coverage)        Expected Discharge Plan: Skilled Nursing Facility Barriers to Discharge: Continued Medical Work up, English as a second language teacher, SNF Pending bed offer  Expected Discharge Plan and Services In-house Referral: Clinical Social Work   Post Acute Care Choice: Skilled Nursing Facility Living arrangements for the past 2 months: Single Family Home                                       Social Determinants of Health (SDOH) Interventions SDOH Screenings   Food Insecurity: No Food Insecurity (06/21/2023)  Housing: High Risk (05/22/2023)  Transportation Needs: No Transportation Needs (05/22/2023)  Utilities: Patient Declined (06/21/2023)  Financial Resource Strain: High Risk (07/05/2017)  Physical Activity: Inactive (07/05/2017)  Social Connections: Socially Integrated (05/05/2023)  Stress: Stress Concern Present (07/05/2017)  Tobacco Use: Low Risk  (07/08/2023)  Health Literacy: Low Risk  (06/01/2020)   Received from [facility], [facility]    Readmission Risk Interventions    06/25/2023    9:22 AM 06/21/2023   10:51 AM 05/17/2023    3:44 PM  Readmission Risk Prevention Plan  Transportation Screening Complete Complete Complete  Medication Review (RN Care Manager) Complete Complete Referral to Pharmacy  PCP or Specialist appointment within 3-5 days of discharge Complete  Complete  HRI or Home  Care Consult Complete Complete Complete  SW Recovery Care/Counseling Consult Complete Complete Complete  Palliative Care Screening Not Applicable Not Applicable Not Applicable  Skilled Nursing Facility Complete Not Applicable Not Applicable

## 2023-07-12 ENCOUNTER — Encounter: Admitting: Orthopedic Surgery

## 2023-07-12 ENCOUNTER — Encounter (INDEPENDENT_AMBULATORY_CARE_PROVIDER_SITE_OTHER): Payer: Self-pay

## 2023-07-13 ENCOUNTER — Observation Stay (HOSPITAL_COMMUNITY)
Admission: EM | Admit: 2023-07-13 | Discharge: 2023-07-16 | Disposition: A | Discharge status: Home / Self Care | Attending: Internal Medicine | Admitting: Internal Medicine

## 2023-07-13 ENCOUNTER — Encounter (HOSPITAL_COMMUNITY): Payer: Self-pay | Admitting: *Deleted

## 2023-07-13 ENCOUNTER — Other Ambulatory Visit: Payer: Self-pay

## 2023-07-13 DIAGNOSIS — I2510 Atherosclerotic heart disease of native coronary artery without angina pectoris: Secondary | ICD-10-CM | POA: Diagnosis not present

## 2023-07-13 DIAGNOSIS — G43909 Migraine, unspecified, not intractable, without status migrainosus: Secondary | ICD-10-CM | POA: Diagnosis not present

## 2023-07-13 DIAGNOSIS — Z7901 Long term (current) use of anticoagulants: Secondary | ICD-10-CM | POA: Insufficient documentation

## 2023-07-13 DIAGNOSIS — I480 Paroxysmal atrial fibrillation: Secondary | ICD-10-CM | POA: Diagnosis not present

## 2023-07-13 DIAGNOSIS — E1122 Type 2 diabetes mellitus with diabetic chronic kidney disease: Secondary | ICD-10-CM | POA: Diagnosis not present

## 2023-07-13 DIAGNOSIS — E875 Hyperkalemia: Secondary | ICD-10-CM | POA: Insufficient documentation

## 2023-07-13 DIAGNOSIS — D631 Anemia in chronic kidney disease: Secondary | ICD-10-CM | POA: Diagnosis not present

## 2023-07-13 DIAGNOSIS — I252 Old myocardial infarction: Secondary | ICD-10-CM | POA: Diagnosis not present

## 2023-07-13 DIAGNOSIS — N186 End stage renal disease: Principal | ICD-10-CM

## 2023-07-13 DIAGNOSIS — Z992 Dependence on renal dialysis: Secondary | ICD-10-CM | POA: Diagnosis not present

## 2023-07-13 DIAGNOSIS — E1169 Type 2 diabetes mellitus with other specified complication: Secondary | ICD-10-CM

## 2023-07-13 DIAGNOSIS — E1140 Type 2 diabetes mellitus with diabetic neuropathy, unspecified: Secondary | ICD-10-CM | POA: Insufficient documentation

## 2023-07-13 DIAGNOSIS — Z79899 Other long term (current) drug therapy: Secondary | ICD-10-CM | POA: Insufficient documentation

## 2023-07-13 DIAGNOSIS — E785 Hyperlipidemia, unspecified: Secondary | ICD-10-CM | POA: Diagnosis not present

## 2023-07-13 DIAGNOSIS — M859 Disorder of bone density and structure, unspecified: Secondary | ICD-10-CM | POA: Insufficient documentation

## 2023-07-13 DIAGNOSIS — R45851 Suicidal ideations: Principal | ICD-10-CM | POA: Insufficient documentation

## 2023-07-13 DIAGNOSIS — E440 Moderate protein-calorie malnutrition: Secondary | ICD-10-CM

## 2023-07-13 DIAGNOSIS — Z87898 Personal history of other specified conditions: Secondary | ICD-10-CM

## 2023-07-13 DIAGNOSIS — I10 Essential (primary) hypertension: Secondary | ICD-10-CM | POA: Diagnosis not present

## 2023-07-13 DIAGNOSIS — Z7982 Long term (current) use of aspirin: Secondary | ICD-10-CM | POA: Insufficient documentation

## 2023-07-13 DIAGNOSIS — Z89612 Acquired absence of left leg above knee: Secondary | ICD-10-CM | POA: Insufficient documentation

## 2023-07-13 DIAGNOSIS — I120 Hypertensive chronic kidney disease with stage 5 chronic kidney disease or end stage renal disease: Principal | ICD-10-CM | POA: Insufficient documentation

## 2023-07-13 DIAGNOSIS — F32A Depression, unspecified: Secondary | ICD-10-CM

## 2023-07-13 DIAGNOSIS — Z8673 Personal history of transient ischemic attack (TIA), and cerebral infarction without residual deficits: Secondary | ICD-10-CM | POA: Diagnosis not present

## 2023-07-13 DIAGNOSIS — G8929 Other chronic pain: Secondary | ICD-10-CM | POA: Insufficient documentation

## 2023-07-13 LAB — CBC WITH DIFFERENTIAL/PLATELET
Abs Immature Granulocytes: 0.04 10*3/uL (ref 0.00–0.07)
Basophils Absolute: 0.1 10*3/uL (ref 0.0–0.1)
Basophils Relative: 1 %
Eosinophils Absolute: 1.3 10*3/uL — ABNORMAL HIGH (ref 0.0–0.5)
Eosinophils Relative: 11 %
HCT: 27.5 % — ABNORMAL LOW (ref 39.0–52.0)
Hemoglobin: 8 g/dL — ABNORMAL LOW (ref 13.0–17.0)
Immature Granulocytes: 0 %
Lymphocytes Relative: 13 %
Lymphs Abs: 1.4 10*3/uL (ref 0.7–4.0)
MCH: 22.6 pg — ABNORMAL LOW (ref 26.0–34.0)
MCHC: 29.1 g/dL — ABNORMAL LOW (ref 30.0–36.0)
MCV: 77.7 fL — ABNORMAL LOW (ref 80.0–100.0)
Monocytes Absolute: 1 10*3/uL (ref 0.1–1.0)
Monocytes Relative: 9 %
Neutro Abs: 7.3 10*3/uL (ref 1.7–7.7)
Neutrophils Relative %: 66 %
Platelets: 282 10*3/uL (ref 150–400)
RBC: 3.54 MIL/uL — ABNORMAL LOW (ref 4.22–5.81)
RDW: 23.2 % — ABNORMAL HIGH (ref 11.5–15.5)
Smear Review: ADEQUATE
WBC: 11 10*3/uL — ABNORMAL HIGH (ref 4.0–10.5)
nRBC: 0 % (ref 0.0–0.2)

## 2023-07-13 LAB — COMPREHENSIVE METABOLIC PANEL WITH GFR
ALT: 30 U/L (ref 0–44)
AST: 17 U/L (ref 15–41)
Albumin: 2.3 g/dL — ABNORMAL LOW (ref 3.5–5.0)
Alkaline Phosphatase: 69 U/L (ref 38–126)
Anion gap: 13 (ref 5–15)
BUN: 146 mg/dL — ABNORMAL HIGH (ref 6–20)
CO2: 21 mmol/L — ABNORMAL LOW (ref 22–32)
Calcium: 9.2 mg/dL (ref 8.9–10.3)
Chloride: 106 mmol/L (ref 98–111)
Creatinine, Ser: 11.56 mg/dL — ABNORMAL HIGH (ref 0.61–1.24)
GFR, Estimated: 5 mL/min — ABNORMAL LOW (ref >60)
Glucose, Bld: 87 mg/dL (ref 70–99)
Potassium: 5.6 mmol/L — ABNORMAL HIGH (ref 3.5–5.1)
Sodium: 140 mmol/L (ref 135–145)
Total Bilirubin: 0.7 mg/dL (ref 0.0–1.2)
Total Protein: 6.4 g/dL — ABNORMAL LOW (ref 6.5–8.1)

## 2023-07-13 MED ORDER — SODIUM ZIRCONIUM CYCLOSILICATE 5 G PO PACK
10.0000 g | PACK | ORAL | Status: AC
  Administered 2023-07-13: 10 g via ORAL
  Filled 2023-07-13: qty 2

## 2023-07-13 NOTE — H&P (Addendum)
 History and Physical    Patient: [name]. WGN:[id number] DOB: [birth year] DOA: 07/13/2023 DOS: the patient was seen and examined on 07/13/2023 PCP: Ayesha Lente, FNP  Patient coming from: SNF  Chief Complaint:  Chief Complaint  Patient presents with   Psychiatric Evaluation   HPI: [name] Osker Ayoub. is a 53 y.o. male with medical history significant of ESRD on HD, (T TH Sat), bilateral AKA, T2DM,  coronary artery diease, heart failure, paroxysmal atrial fibrillation, and seizures who presented from the SNF due to suicidal thoughts.  Recent hospitalization 04/28 to 07/08/23 for left lower extremity wound infection that required AKA.  He was discharged to SNF.  Today he expressed to nursing home staff that he was willing to end his life. EMS was called and patient was transported to the ED.  Apparently he did not complete his HD session yesterday due to buttock pain.   In the emergency department he denies being suicidal and not willing to harm him self or others.  At the time of my examination he is only answering to simple questions, denies any pain or dyspnea.   Review of Systems: unable to review all systems due to the inability of the patient to answer questions. Past Medical History:  Diagnosis Date   Acute Bil  CVAs of Bil Frontal amd Rt Parietal  in Watershed Distribution 04/21/2021   Patchy acute cortically-based infarcts within the bilateral high  frontal lobes and right parietal lobe (predominantly in a watershed  distribution).   Subcentimeter acute infarct within the callosal splenium on the  left.     Acute bilateral cerebral infarction in a watershed distribution [facility]) 04/21/2021   a.) MRI brain 04/21/2021 --> patchy ACUTE cortically-based infarcts within the bilateral high frontal lobes and right parietal lobe   Acute cerebral infarction (HCC) 02/13/2021   a.) MRI brain 02/13/2021: acute LEFT hippocampal infarct   Acute cerebral infarction (HCC) 04/21/2021    a.) MRI brain 04/21/2021: subcentimeter ACUTE infarct within the callosal splenium on the LEFT   Acute osteomyelitis of toe of right foot with gangrene (HCC) 02/26/2022   Anaphylactic reaction due to adverse effect of correct drug or medicament properly administered, initial encounter 05/08/2019   Anemia of chronic renal failure    Anxiety    Aortic dilatation (HCC) 11/25/2020   a.) TTE 11/25/2020: Ao root measured 38 mm. b.) TTE 04/22/2021: Ao root measured 44 mm; c. 11/2021 Echo: Ao root 40mm.   Atrial fibrillation (HCC)    a.) CHA2DS2-VASc = 6 (CHF, HTN, CVA x2, prior MI, T2DM). b.) rate/rhythm maintained on oral amiodarone  + carvedilol ; chronic OAC/AP therapy discontinued following ICH   Benign prostatic hyperplasia with lower urinary tract symptoms 11/07/2019   BPH (benign prostatic hyperplasia)    Cardiac arrest (HCC) 07/26/2021   a.) in setting of hyperkalemia, NSTEMI, and seizure following missed HD; pulseless and apneic --> required 1 round of CPR prior to ROSC   Cellulitis    Cerebral hemorrhage (HCC) 11/21/2021   a.) CT head 11/21/2021 --> 1.4 x 1.5 x 3.1 cm ACUTE hemorrhage within the inferior aspect of the fourth ventricle, and extending inferiorly through the foramen of foramen of [place] --> occurred in setting of HTN emergency and DOAC/APT-->eliquis /plavix  d/c'd.   Cerebral microvascular disease    Chronic diarrhea    Chronic heart failure with preserved ejection fraction (HFpEF) (HCC) 07/19/2017   a.) 06/2017 Echo: EF 75%; b.) 11/2020 Echo: EF 55-60%; c.) 04/2021 Echo: EF 55-60%; d.) 11/2021 Echo: EF 65-70%,  GrII DD, nl RV fxn, sev dil LA, large circumferential pericardial eff w/o tamponade, triv MR, AoV sclerosis. Ao root 40mm; e.) TTE 02/28/2025: EF 60-65%, sev LVH, mod posterior LV effusion, AoV sclerosis   Chronic pain of both ankles    Coronary artery disease 02/26/2020   a. 02/2020 Ant STEMI/PCI: LM nl, LAD 37m (3.5x30 Resolute Onyx), RI nl, LCX min irregs, RCA min  irregs. EF 65%.   DDD (degenerative disc disease), cervical    Diabetes mellitus, type II (HCC)    Diabetic neuropathy (HCC)    Esophagitis    ESRD (end stage renal disease) on dialysis (HCC)    a.) [facility]; T-Th-Sat   Frequent falls    Gait instability    Gastritis    Gastroparesis    GERD (gastroesophageal reflux disease)    H/O enucleation of left eyeball    Heart palpitations    History of kidney stones    HLD (hyperlipidemia)    Hypertension    Insomnia    a.) uses melatonin PRN   Long term current use of amiodarone     Nausea and vomiting in adult    recurrent   NSTEMI (non-ST elevated myocardial infarction) (HCC) 07/26/2021   a.) hyperkalemic from missed HD; seizures; decompensated to cardiac arrest and required CPR x 1 round prior to ROSC.   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  Added automatically from request for surgery [id number]     Osteomyelitis [facility])    Pericardial effusion    a. 11/2021 Echo: EF 65-70%, GrII DD, nl RV fxn, sev dil LA, large circumferential pericardial eff w/o tamponade, triv MR, AoV sclerosis. Ao root 40mm.   Seizure (HCC)    a.) last 07/26/2021 in setting of missed HD --> hyperkalemic at 6.5 --> pulseless/apneic and required CPR; discharged home on levetiracetam .   ST elevation myocardial infarction (STEMI) of anterior wall (HCC) 02/26/2020   a.) LHC/PCI 02/26/2020 --> EF 65%; LVEDP 11 mmHg; 90% mLAD (3.5 x 20 mm Resolute Onyx DES x 1)   Past Surgical History:  Procedure Laterality Date   A/V FISTULAGRAM Left 08/31/2021   Procedure: A/V Fistulagram;  Surgeon: Celso College, MD;  Location: [name] INVASIVE CV LAB;  Service: Cardiovascular;  Laterality: Left;   A/V FISTULAGRAM Left 01/25/2022   Procedure: A/V Fistulagram;  Surgeon: Celso College, MD;  Location: [name] INVASIVE CV LAB;  Service: Cardiovascular;  Laterality: Left;   A/V FISTULAGRAM Right 10/18/2022    Procedure: A/V Fistulagram;  Surgeon: Celso College, MD;  Location: [name] INVASIVE CV LAB;  Service: Cardiovascular;  Laterality: Right;   ABDOMINAL AORTOGRAM W/LOWER EXTREMITY Bilateral 02/08/2023   Procedure: ABDOMINAL AORTOGRAM W/LOWER EXTREMITY;  Surgeon: Philipp Brawn, MD;  Location: [facility] INVASIVE CV LAB;  Service: Vascular;  Laterality: Bilateral;   AMPUTATION Left 03/16/2016   Procedure: AMPUTATION DIGIT LEFT HALLUX;  Surgeon: Charity Conch, DPM;  Location: [facility] OR;  Service: Podiatry;  Laterality: Left;  can start around 5    AMPUTATION Right 02/09/2021   Procedure: AMPUTATION DIGIT;  Surgeon: Marilyn Shropshire, MD;  Location: [facility] OR;  Service: Orthopedics;  Laterality: Right;   AMPUTATION Right 02/04/2023   Procedure: AMPUTATION 4TH AND 5TH METATARSAL;  Surgeon: Evertt Hoe, DPM;  Location: [facility] OR;  Service: Orthopedics/Podiatry;  Laterality: Right;  I&D, partial ray resection, abx beads   AMPUTATION Right 02/09/2023   Procedure: RIGHT BELOW KNEE AMPUTATION;  Surgeon: Gearldean Keepers  V, MD;  Location: [facility] OR;  Service: Orthopedics;  Laterality: Right;   AMPUTATION Right 04/06/2023   Procedure: RIGHT ABOVE KNEE AMPUTATION;  Surgeon: Timothy Ford, MD;  Location: [facility] OR;  Service: Orthopedics;  Laterality: Right;   AMPUTATION Left 06/24/2023   Procedure: AMPUTATION, ABOVE KNEE;  Surgeon: Timothy Ford, MD;  Location: [facility] OR;  Service: Orthopedics;  Laterality: Left;   AMPUTATION Right 05/11/2023   Procedure: RIGHT ABOVE KNEE AMPUTATION;  Surgeon: Timothy Ford, MD;  Location: [facility] OR;  Service: Orthopedics;  Laterality: Right;  REVISION RIGHT ABOVE KNEE AMPUTATION   AMPUTATION TOE Right 02/28/2022   Procedure: AMPUTATION TOE;  Surgeon: Jennefer Moats, DPM;  Location: [name] ORS;  Service: Podiatry;  Laterality: Right;  right fifth toe   ARTHROSCOPIC REPAIR ACL Left    AV FISTULA PLACEMENT Right 05/31/2019   Procedure: Brachiocephalic AV fistula creation;  Surgeon: Celso College, MD;   Location: [name] ORS;  Service: Vascular;  Laterality: Right;   AV FISTULA PLACEMENT Left 08/13/2021   Procedure: ARTERIOVENOUS (AV) FISTULA CREATION (BRACHIALCEPHALIC);  Surgeon: Celso College, MD;  Location: [name] ORS;  Service: Vascular;  Laterality: Left;   CHEST TUBE INSERTION Right 05/06/2023   Procedure: CHEST TUBE INSERTION;  Surgeon: [name] Nigh, MD;  Location: [facility] ENDOSCOPY;  Service: Pulmonary;  Laterality: Right;   COLONOSCOPY WITH PROPOFOL  N/A 10/28/2015   Procedure: COLONOSCOPY WITH PROPOFOL ;  Surgeon: Deveron Fly, MD;  Location: [facility] ENDOSCOPY;  Service: Endoscopy;  Laterality: N/A;   COLONOSCOPY WITH PROPOFOL  N/A 10/29/2015   Procedure: COLONOSCOPY WITH PROPOFOL ;  Surgeon: Deveron Fly, MD;  Location: [facility] ENDOSCOPY;  Service: Endoscopy;  Laterality: N/A;   COLONOSCOPY WITH PROPOFOL  N/A 01/27/2023   Procedure: COLONOSCOPY WITH PROPOFOL ;  Surgeon: Hargis Lias, MD;  Location: [facility] ENDO SUITE;  Service: Endoscopy;  Laterality: N/A;  12:30pm, asa 3   CORONARY ULTRASOUND/IVUS N/A 02/26/2020   Procedure: Intravascular Ultrasound/IVUS;  Surgeon: Lucendia Rusk, MD;  Location: [facility] INVASIVE CV LAB;  Service: Cardiovascular;  Laterality: N/A;   CORONARY/GRAFT ACUTE MI REVASCULARIZATION N/A 02/26/2020   Procedure: Coronary/Graft Acute MI Revascularization;  Surgeon: Lucendia Rusk, MD;  Location: [facility] INVASIVE CV LAB;  Service: Cardiovascular;  Laterality: N/A;   DIALYSIS/PERMA CATHETER INSERTION Right 04/26/2019   Perm Cath    DIALYSIS/PERMA CATHETER INSERTION N/A 04/26/2019   Procedure: DIALYSIS/PERMA CATHETER INSERTION;  Surgeon: Celso College, MD;  Location: [name] INVASIVE CV LAB;  Service: Cardiovascular;  Laterality: N/A;   DIALYSIS/PERMA CATHETER INSERTION N/A 05/25/2021   Procedure: DIALYSIS/PERMA CATHETER INSERTION;  Surgeon: Celso College, MD;  Location: [name] INVASIVE CV LAB;  Service: Cardiovascular;  Laterality: N/A;   DIALYSIS/PERMA CATHETER INSERTION N/A  08/31/2021   Procedure: DIALYSIS/PERMA CATHETER INSERTION;  Surgeon: Celso College, MD;  Location: [name] INVASIVE CV LAB;  Service: Cardiovascular;  Laterality: N/A;   DIALYSIS/PERMA CATHETER REMOVAL N/A 08/15/2019   Procedure: DIALYSIS/PERMA CATHETER REMOVAL;  Surgeon: Jackquelyn Mass, MD;  Location: [name] INVASIVE CV LAB;  Service: Cardiovascular;  Laterality: N/A;   DIALYSIS/PERMA CATHETER REMOVAL N/A 03/15/2022   Procedure: DIALYSIS/PERMA CATHETER REMOVAL;  Surgeon: Celso College, MD;  Location: [name] INVASIVE CV LAB;  Service: Cardiovascular;  Laterality: N/A;   EMBOLIZATION (CATH LAB) Right 02/06/2021   Procedure: EMBOLIZATION;  Surgeon: Celso College, MD;  Location: [name] INVASIVE CV LAB;  Service: Cardiovascular;  Laterality: Right;  Right Upper Extremity Dialysis Access, Permcath Placement   ESOPHAGOGASTRODUODENOSCOPY (EGD) WITH PROPOFOL  N/A 12/27/2017   Procedure: ESOPHAGOGASTRODUODENOSCOPY (  EGD) WITH PROPOFOL ;  Surgeon: Toledo, Alphonsus Jeans, MD;  Location: [name] ENDOSCOPY;  Service: Gastroenterology;  Laterality: N/A;   EYE SURGERY     IR THORACENTESIS ASP PLEURAL SPACE W/IMG GUIDE  05/05/2023   LEFT HEART CATH AND CORONARY ANGIOGRAPHY N/A 02/26/2020   Procedure: LEFT HEART CATH AND CORONARY ANGIOGRAPHY;  Surgeon: Lucendia Rusk, MD;  Location: [facility] INVASIVE CV LAB;  Service: Cardiovascular;  Laterality: N/A;   PROSTATE SURGERY  2016   REVISON OF ARTERIOVENOUS FISTULA Right 07/22/2022   Procedure: RESECTION OF ANEURYSMAL RIGHT ARM ARTERIOVENOUS FISTULA;  Surgeon: Celso College, MD;  Location: [name] ORS;  Service: Vascular;  Laterality: Right;   TONSILECTOMY/ADENOIDECTOMY WITH MYRINGOTOMY     TONSILLECTOMY     UPPER EXTREMITY ANGIOGRAPHY Right 11/26/2020   Procedure: Upper Extremity Angiography;  Surgeon: Celso College, MD;  Location: [name] INVASIVE CV LAB;  Service: Cardiovascular;  Laterality: Right;   UPPER EXTREMITY ANGIOGRAPHY Right 02/05/2021   Procedure: UPPER EXTREMITY ANGIOGRAPHY;   Surgeon: Celso College, MD;  Location: [name] INVASIVE CV LAB;  Service: Cardiovascular;  Laterality: Right;   Social History:  reports that he has never smoked. He has never used smokeless tobacco. He reports that he does not drink alcohol  and does not use drugs.  Allergies  Allergen Reactions   Promethazine  Diarrhea and Other (See Comments)    Muscle cramps, cramping "all over"    Family History  Problem Relation Age of Onset   CAD Father    Stroke Father    Diabetes Mellitus II Mother    Kidney failure Mother    Schizophrenia Mother     Prior to Admission medications   Medication Sig Start Date End Date Taking? Authorizing Provider  acetaminophen  (TYLENOL ) 325 MG tablet Take 2 tablets (650 mg total) by mouth every 6 (six) hours as needed for mild pain (pain score 1-3) or fever (or Fever >/= 101). 05/26/23   Colin Dawley, MD  Lactobacillus (ACIDOPHILUS) CAPS capsule Take 2 capsules by mouth 3 (three) times daily. 07/08/23 08/07/23  Oral Billings, MD  Amino Acids-Protein Hydrolys (PRO-STAT AWC) LIQD Take 30 mLs by mouth every morning. 06/10/23   [provider]  amiodarone  (PACERONE ) 200 MG tablet Take 1 tablet (200 mg total) by mouth daily. 05/26/23 05/26/24  Colin Dawley, MD  amLODipine  (NORVASC ) 5 MG tablet Take 5 mg by mouth daily. 07/01/23   [provider]  ammonium lactate  (AMLACTIN DAILY) 12 % lotion Apply topically. 02/25/23   [provider]  ascorbic acid  (VITAMIN C ) 500 MG tablet Take 1,000 mg by mouth daily. Patient not taking: Reported on 06/21/2023    [provider]  aspirin  EC 81 MG tablet Take 1 tablet (81 mg total) by mouth daily with breakfast. Swallow whole. Patient not taking: Reported on 06/21/2023 05/26/23   Colin Dawley, MD  atorvastatin  (LIPITOR ) 80 MG tablet Take 1 tablet (80 mg total) by mouth at bedtime. 05/26/23   Colin Dawley, MD  brimonidine  (ALPHAGAN ) 0.2 % ophthalmic solution Place 1 drop into the right eye 3 (three)  times daily. Patient taking differently: Place 1 drop into the right eye 2 (two) times daily. 05/30/23   Ninetta Basket, MD  calcitRIOL  (ROCALTROL ) 0.25 MCG capsule Take 9 capsules (2.25 mcg total) by mouth every Tuesday, Thursday, and Saturday at 6 PM. Patient taking differently: Take 0.25 mcg by mouth in the morning, at noon, and at bedtime. Every Tues-Thur-Sat 02/24/23   DanfordWillis Harter, MD  calcium  acetate (PHOSLO )  667 MG tablet Take 1,334 mg by mouth 3 (three) times daily. 06/18/23   [provider]  carvedilol  (COREG ) 12.5 MG tablet Take 1 tablet (12.5 mg total) by mouth 2 (two) times daily. 05/26/23   Colin Dawley, MD  cinacalcet  (SENSIPAR ) 30 MG tablet Take 2 tablets (60 mg total) by mouth every Tuesday, Thursday, and Saturday at 6 PM. 02/24/23   Danford, Willis Harter, MD  cloNIDine  (CATAPRES ) 0.3 MG tablet Take 0.3 mg by mouth every 8 (eight) hours. 07/01/23   [provider]  dorzolamide -timolol  (COSOPT ) 2-0.5 % ophthalmic solution Place 1 drop into the right eye 2 (two) times daily. 11/15/22 11/15/23  [provider]  hydrALAZINE  (APRESOLINE ) 25 MG tablet Take 25 mg by mouth 3 (three) times daily. 07/01/23   [provider]  levETIRAcetam  (KEPPRA ) 500 MG tablet Take 1 tablet (500 mg total) by mouth 4 (four) times a week. On Sunday, Monday, Wednesday and Friday, all non-dialysis days 05/26/23   Colin Dawley, MD  levETIRAcetam  (KEPPRA ) 750 MG tablet Take 1 tablet (750 mg total) by mouth 3 (three) times a week. On Tuesday Thursday and Saturday, on dialysis days 05/27/23   Colin Dawley, MD  lidocaine -prilocaine  (EMLA ) cream Apply 1 Application topically 3 (three) times a week. 12/24/21   [provider]  loperamide  (IMODIUM ) 2 MG capsule Take 1 capsule (2 mg total) by mouth as needed for diarrhea or loose stools. 02/23/23   Danford, Willis Harter, MD  multivitamin (RENA-VIT) TABS tablet Take 1 tablet by mouth at bedtime. Patient not taking:  Reported on 06/16/2023 04/12/23   Rai, Hurman Maiden, MD  Nutritional Supplements (,FEEDING SUPPLEMENT, PROSOURCE PLUS) liquid Take 30 mLs by mouth 3 (three) times daily between meals. 02/23/23   Danford, Willis Harter, MD  ondansetron  (ZOFRAN ) 4 MG tablet Take 1 tablet (4 mg total) by mouth every 6 (six) hours as needed for nausea or vomiting. 03/17/23   Adel Aden, PA-C  oxyCODONE  (OXY IR/ROXICODONE ) 5 MG immediate release tablet Take 1 tablet (5 mg total) by mouth every 4 (four) hours as needed for moderate pain (pain score 4-6). 07/08/23   Oral Billings, MD  pentoxifylline  (TRENTAL ) 400 MG CR tablet Take 400 mg by mouth 3 (three) times daily. 07/01/23   [provider]  pregabalin  (LYRICA ) 50 MG capsule Take 1 capsule (50 mg total) by mouth daily. 07/09/23   Oral Billings, MD  sodium zirconium cyclosilicate  (LOKELMA ) 10 g PACK packet Take 10 g by mouth 3 (three) times daily. 07/08/23 08/07/23  Oral Billings, MD  Zinc  220 (50 Zn) MG CAPS Take 1 capsule by mouth every morning. 06/18/23   [provider]  Zinc  Oxide (DESITIN MAXIMUM STRENGTH) 40 % PSTE Apply 1 Application topically 2 (two) times daily as needed. 06/16/23   Teddi Favors, DO    Physical Exam: Vitals:   07/13/23 1930 07/13/23 2000 07/13/23 2100 07/13/23 2200  BP: (!) 152/66 136/65 (!) 141/64 (!) 152/66  Pulse: 65 65 65 66  Resp: 13 14 13 12   Temp:  98.7 F (37.1 C)    TempSrc:      SpO2: 100% 94% 93% 95%  Weight:       Neurology awake and alert, no focal, eyes closed but answering to simple questions and following commands ENT with mild pallor Cardiovascular with S1 and S2 present and regular with no gallops, rubs or murmurs Respiratory with no rales or wheezing, no rhonchi  Abdomen with no distention, non tender  Bilateral AKA with no thigh edema, left stump with staples in place with no erythema or drainage. Left upper extremity fistula in place.    Data Reviewed:   Na 140, K 5,6 Cl 106  bicarbonate 21, glucose 146, bun 146 cr 11,5  AST 17 ALT 30  Wbc 11.0 hgb 8,0 plt 282   EKG 65 bpm, normal axis, normal intervals, qtc 449, sinus rhythm with 1st degree AV  block, with no significant ST segment or T wave changes.   Assessment and Plan: * ESRD on hemodialysis (HCC) Hyperkalemia.   Patient with no signs of volume overload, or severe acidosis. Mild uremic symptoms.   Plan to continue sodium zirconium for hyperkalemia Continue telemetry monitoring Consult nephrology for inpatient renal replacement therapy 05/22.  (EKG changes related to hyperkalemia, 1st degree AV block is chronic).  Continue blood pressure control with amlodipine , hydralazine , carvedilol  and clonidine    Anemia of chronic renal disease, cell count stable.   Metabolic bone disease, continue with phoslo , cinacalcet  and calcitriol .     CAD/ H/o prior STEMI 02/2020 s/p DES to LAD No chest pain, no acute coronary syndrome.  Continue aspirin  and statin   Essential hypertension, benign Continue blood pressure control with clonidine , hydralazine , carvedilol  and amlodipine    Paroxysmal atrial fibrillation (HCC) - not on systemic anticoagulation due to ICH while on Eliquis  in 11-2021. Continue with amiodarone  Patient is off anticoagulation   Type 2 diabetes mellitus with hyperlipidemia (HCC) Continue glucose cover and monitoring with insulin  sliding scale.   Continue with statin therapy   History of CVA (cerebrovascular accident) - with ICH while on plavix  and Eliquis  in 11-2021. Continue blood pressure control, continue statin therapy and aspirin    History of seizure No signs of active seizures.  Continue with keppra    Moderate protein-calorie malnutrition (HCC) Continue nutritional supplements.  Depression Patient with no further suicidal thoughts.  Will consult psychiatry for evaluation.       Advance Care Planning:   Code Status: Limited: Do not attempt resuscitation (DNR) -DNR-LIMITED -Do  Not Intubate/DNI    Consults: Nephrology. Psychiatry   Family Communication: no family at the bedside   Severity of Illness: The appropriate patient status for this patient is OBSERVATION. Observation status is judged to be reasonable and necessary in order to provide the required intensity of service to ensure the patient's safety. The patient's presenting symptoms, physical exam findings, and initial radiographic and laboratory data in the context of their medical condition is felt to place them at decreased risk for further clinical deterioration. Furthermore, it is anticipated that the patient will be medically stable for discharge from the hospital within 2 midnights of admission.   Author: Albertus Alt, MD 07/13/2023 11:00 PM  For on call review [email or web address].

## 2023-07-13 NOTE — Assessment & Plan Note (Signed)
 Continue with amiodarone  Patient is off anticoagulation

## 2023-07-13 NOTE — Assessment & Plan Note (Signed)
 No signs of active seizures.  Continue with keppra 

## 2023-07-13 NOTE — Assessment & Plan Note (Addendum)
 Continue blood pressure control with clonidine , hydralazine , carvedilol  and amlodipine 

## 2023-07-13 NOTE — Assessment & Plan Note (Signed)
 Continue blood pressure control, continue statin therapy and aspirin 

## 2023-07-13 NOTE — Assessment & Plan Note (Addendum)
 Hyperkalemia.   Patient with no signs of volume overload, or severe acidosis. Mild uremic symptoms.   Plan to continue sodium zirconium for hyperkalemia Continue telemetry monitoring Consult nephrology for inpatient renal replacement therapy 05/22.  (EKG changes related to hyperkalemia, 1st degree AV block is chronic).  Continue blood pressure control with amlodipine , hydralazine , carvedilol  and clonidine    Anemia of chronic renal disease, cell count stable.   Metabolic bone disease, continue with phoslo , cinacalcet  and calcitriol .

## 2023-07-13 NOTE — Assessment & Plan Note (Signed)
Continue nutritional supplements. °

## 2023-07-13 NOTE — Assessment & Plan Note (Signed)
 No chest pain, no acute coronary syndrome.  Continue aspirin  and statin

## 2023-07-13 NOTE — ED Notes (Signed)
 ED Provider at bedside.

## 2023-07-13 NOTE — Assessment & Plan Note (Signed)
 Patient with no further suicidal thoughts.  Will consult psychiatry for evaluation.

## 2023-07-13 NOTE — ED Triage Notes (Signed)
 Pt bib EMS from [facility] for suicidal thoughts, per ems pt stated that he would stab himself if he could. He also refused dialysis due to pain yesterday.

## 2023-07-13 NOTE — Assessment & Plan Note (Signed)
Continue glucose cover and monitoring with insulin sliding scale.  Continue with statin therapy.

## 2023-07-13 NOTE — ED Notes (Signed)
 Pt moved up in bed, call light placed within reach, pt given cup of water - pt drank all of it, pt updated on plan of care- a/w TTS consult.

## 2023-07-13 NOTE — ED Provider Notes (Signed)
 [place] EMERGENCY DEPARTMENT AT [facility] Provider Note   CSN: [id number] Arrival date & time: 07/13/23  1713     History  Chief Complaint  Patient presents with   Psychiatric Evaluation    [name]. is a 53 y.o. male.  HPI   This patient is a 53 year old male with a history of end-stage renal disease on dialysis Tuesday Thursday Saturday, history of A-fib on amiodarone , baby aspirin , Keppra  for seizures and Lyrica , he presents to the hospital with a complaint of feeling suicidal.  He comes from [facility] nursing facility where he has been at least for the last several days.  He just had a prolonged admission from the end of April through May 16 during which time he had a necrotic foot with gangrene and required an amputation of his left lower extremity.  He now has bilateral lower extremity amputations, evidently he did not finish dialysis at his last session because his "butt hurt".  There has been no fevers vomiting or other complaints, the patient had made comments that he wanted to kill himself and that if he had a knife he would stab himself because he states he has "nothing to live for".  Home Medications Prior to Admission medications   Medication Sig Start Date End Date Taking? Authorizing Provider  acetaminophen  (TYLENOL ) 325 MG tablet Take 2 tablets (650 mg total) by mouth every 6 (six) hours as needed for mild pain (pain score 1-3) or fever (or Fever >/= 101). 05/26/23   Colin Dawley, MD  Lactobacillus (ACIDOPHILUS) CAPS capsule Take 2 capsules by mouth 3 (three) times daily. 07/08/23 08/07/23  Oral Billings, MD  Amino Acids-Protein Hydrolys (PRO-STAT AWC) LIQD Take 30 mLs by mouth every morning. 06/10/23   [provider]  amiodarone  (PACERONE ) 200 MG tablet Take 1 tablet (200 mg total) by mouth daily. 05/26/23 05/26/24  Colin Dawley, MD  ammonium lactate  (AMLACTIN DAILY) 12 % lotion Apply topically. 02/25/23   [provider]   ascorbic acid  (VITAMIN C ) 500 MG tablet Take 1,000 mg by mouth daily. Patient not taking: Reported on 06/21/2023    [provider]  aspirin  EC 81 MG tablet Take 1 tablet (81 mg total) by mouth daily with breakfast. Swallow whole. Patient not taking: Reported on 06/21/2023 05/26/23   Colin Dawley, MD  atorvastatin  (LIPITOR ) 80 MG tablet Take 1 tablet (80 mg total) by mouth at bedtime. 05/26/23   Colin Dawley, MD  brimonidine  (ALPHAGAN ) 0.2 % ophthalmic solution Place 1 drop into the right eye 3 (three) times daily. Patient taking differently: Place 1 drop into the right eye 2 (two) times daily. 05/30/23   Ninetta Basket, MD  calcitRIOL  (ROCALTROL ) 0.25 MCG capsule Take 9 capsules (2.25 mcg total) by mouth every Tuesday, Thursday, and Saturday at 6 PM. Patient taking differently: Take 0.25 mcg by mouth in the morning, at noon, and at bedtime. Every Tues-Thur-Sat 02/24/23   DanfordWillis Harter, MD  calcium  acetate (PHOSLO ) 667 MG tablet Take 1,334 mg by mouth 3 (three) times daily. 06/18/23   [provider]  carvedilol  (COREG ) 12.5 MG tablet Take 1 tablet (12.5 mg total) by mouth 2 (two) times daily. 05/26/23   Colin Dawley, MD  cinacalcet  (SENSIPAR ) 30 MG tablet Take 2 tablets (60 mg total) by mouth every Tuesday, Thursday, and Saturday at 6 PM. 02/24/23   Danford, Willis Harter, MD  dorzolamide -timolol  (COSOPT ) 2-0.5 % ophthalmic solution Place 1 drop into the right eye 2 (two)  times daily. 11/15/22 11/15/23  [provider]  levETIRAcetam  (KEPPRA ) 500 MG tablet Take 1 tablet (500 mg total) by mouth 4 (four) times a week. On Sunday, Monday, Wednesday and Friday, all non-dialysis days 05/26/23   Colin Dawley, MD  levETIRAcetam  (KEPPRA ) 750 MG tablet Take 1 tablet (750 mg total) by mouth 3 (three) times a week. On Tuesday Thursday and Saturday, on dialysis days 05/27/23   Colin Dawley, MD  lidocaine -prilocaine  (EMLA ) cream Apply 1 Application topically 3 (three) times  a week. 12/24/21   [provider]  loperamide  (IMODIUM ) 2 MG capsule Take 1 capsule (2 mg total) by mouth as needed for diarrhea or loose stools. 02/23/23   Danford, Willis Harter, MD  multivitamin (RENA-VIT) TABS tablet Take 1 tablet by mouth at bedtime. Patient not taking: Reported on 06/16/2023 04/12/23   Rai, Hurman Maiden, MD  Nutritional Supplements (,FEEDING SUPPLEMENT, PROSOURCE PLUS) liquid Take 30 mLs by mouth 3 (three) times daily between meals. 02/23/23   Danford, Willis Harter, MD  ondansetron  (ZOFRAN ) 4 MG tablet Take 1 tablet (4 mg total) by mouth every 6 (six) hours as needed for nausea or vomiting. 03/17/23   Adel Aden, PA-C  oxyCODONE  (OXY IR/ROXICODONE ) 5 MG immediate release tablet Take 1 tablet (5 mg total) by mouth every 4 (four) hours as needed for moderate pain (pain score 4-6). 07/08/23   Oral Billings, MD  pregabalin  (LYRICA ) 50 MG capsule Take 1 capsule (50 mg total) by mouth daily. 07/09/23   Oral Billings, MD  sodium zirconium cyclosilicate  (LOKELMA ) 10 g PACK packet Take 10 g by mouth 3 (three) times daily. 07/08/23 08/07/23  Oral Billings, MD  Zinc  Oxide (DESITIN MAXIMUM STRENGTH) 40 % PSTE Apply 1 Application topically 2 (two) times daily as needed. 06/16/23   Teddi Favors, DO      Allergies    Promethazine     Review of Systems   Review of Systems  All other systems reviewed and are negative.   Physical Exam Updated Vital Signs BP (!) 154/69   Pulse 65   Temp 99.6 F (37.6 C) (Axillary)   Resp 18   Wt 68.8 kg   SpO2 98%   BMI 18.96 kg/m  Physical Exam Vitals and nursing note reviewed.  Constitutional:      General: He is not in acute distress.    Appearance: He is well-developed.  HENT:     Head: Normocephalic and atraumatic.     Mouth/Throat:     Pharynx: No oropharyngeal exudate.  Eyes:     General: No scleral icterus.       Right eye: No discharge.        Left eye: No discharge.     Conjunctiva/sclera: Conjunctivae normal.      Pupils: Pupils are equal, round, and reactive to light.  Neck:     Thyroid : No thyromegaly.     Vascular: No JVD.  Cardiovascular:     Rate and Rhythm: Normal rate and regular rhythm.     Heart sounds: Murmur heard.     No friction rub. No gallop.  Pulmonary:     Effort: Pulmonary effort is normal. No respiratory distress.     Breath sounds: Normal breath sounds. No wheezing or rales.  Abdominal:     General: Bowel sounds are normal. There is no distension.     Palpations: Abdomen is soft. There is no mass.     Tenderness: There is no abdominal tenderness.  Musculoskeletal:  General: No tenderness. Normal range of motion.     Cervical back: Normal range of motion and neck supple.     Right lower leg: No edema.     Left lower leg: No edema.     Comments: The wound is clean dry and intact with staples present on his left lower extremity, right lower extremity stump is unremarkable  Lymphadenopathy:     Cervical: No cervical adenopathy.  Skin:    General: Skin is warm and dry.     Findings: No erythema or rash.  Neurological:     Mental Status: He is alert.     Coordination: Coordination normal.     Comments: The patient is able to answer questions and follow commands, appears sleepy but easily arousable to voice  Psychiatric:        Behavior: Behavior normal.     ED Results / Procedures / Treatments   Labs (all labs ordered are listed, but only abnormal results are displayed) Labs Reviewed  CBC WITH DIFFERENTIAL/PLATELET - Abnormal; Notable for the following components:      Result Value   WBC 11.0 (*)    RBC 3.54 (*)    Hemoglobin 8.0 (*)    HCT 27.5 (*)    MCV 77.7 (*)    MCH 22.6 (*)    MCHC 29.1 (*)    RDW 23.2 (*)    Eosinophils Absolute 1.3 (*)    All other components within normal limits  COMPREHENSIVE METABOLIC PANEL WITH GFR - Abnormal; Notable for the following components:   Potassium 5.6 (*)    CO2 21 (*)    BUN 146 (*)    Creatinine, Ser 11.56  (*)    Total Protein 6.4 (*)    Albumin  2.3 (*)    GFR, Estimated 5 (*)    All other components within normal limits  BASIC METABOLIC PANEL WITH GFR - Abnormal; Notable for the following components:   Potassium 5.7 (*)    CO2 20 (*)    BUN 147 (*)    Creatinine, Ser 11.95 (*)    GFR, Estimated 5 (*)    Anion gap 19 (*)    All other components within normal limits  CBC - Abnormal; Notable for the following components:   WBC 11.5 (*)    RBC 3.49 (*)    Hemoglobin 8.1 (*)    HCT 26.9 (*)    MCV 77.1 (*)    MCH 23.2 (*)    RDW 23.9 (*)    All other components within normal limits  GLUCOSE, CAPILLARY - Abnormal; Notable for the following components:   Glucose-Capillary 100 (*)    All other components within normal limits  GLUCOSE, CAPILLARY - Abnormal; Notable for the following components:   Glucose-Capillary 128 (*)    All other components within normal limits  BASIC METABOLIC PANEL WITH GFR - Abnormal; Notable for the following components:   Glucose, Bld 120 (*)    BUN 98 (*)    Creatinine, Ser 8.49 (*)    GFR, Estimated 7 (*)    All other components within normal limits  GLUCOSE, CAPILLARY - Abnormal; Notable for the following components:   Glucose-Capillary 119 (*)    All other components within normal limits  GLUCOSE, CAPILLARY - Abnormal; Notable for the following components:   Glucose-Capillary 163 (*)    All other components within normal limits  GLUCOSE, CAPILLARY - Abnormal; Notable for the following components:   Glucose-Capillary 125 (*)  All other components within normal limits  GLUCOSE, CAPILLARY - Abnormal; Notable for the following components:   Glucose-Capillary 142 (*)    All other components within normal limits  GLUCOSE, CAPILLARY - Abnormal; Notable for the following components:   Glucose-Capillary 140 (*)    All other components within normal limits  GLUCOSE, CAPILLARY - Abnormal; Notable for the following components:   Glucose-Capillary 158 (*)     All other components within normal limits  GLUCOSE, CAPILLARY - Abnormal; Notable for the following components:   Glucose-Capillary 110 (*)    All other components within normal limits  HEPATITIS B SURFACE ANTIBODY, QUANTITATIVE    EKG EKG Interpretation Date/Time:  Wednesday Jul 13 2023 18:21:37 EDT Ventricular Rate:  65 PR Interval:  224 QRS Duration:  110 QT Interval:  431 QTC Calculation: 449 R Axis:   53  Text Interpretation: Sinus rhythm Prolonged PR interval Borderline T abnormalities, inferior leads Confirmed by Early Glisson ([id number]) on 07/13/2023 6:23:53 PM  Radiology No results found.  Procedures Procedures    Medications Ordered in ED Medications - No data to display  ED Course/ Medical Decision Making/ A&P                                 Medical Decision Making Amount and/or Complexity of Data Reviewed Labs: ordered. ECG/medicine tests: ordered.  Risk Prescription drug management. Decision regarding hospitalization.   Dialysis access left upper extremity, good thrill without overlying redness or warmth.  Vital signs are unremarkable, no fever tachycardia or hypotension.  Will check for hyperkalemia given incomplete dialysis, EKG and labs pending, will need to talk to psychiatry regarding recommendations for his worsening depression.  Labs ordered, vital signs stable, patient will be evaluated by inpatient team for admission as he presents hyperkalemic and albeit mild to moderate and no EKG changes this will most certainly get worse.  I had a discussion with the patient regarding his goals of care and he does want to have dialysis while he is in the hospital.  Psychiatry has not yet waited at the time of change of shift.  EKG does not show signs of severe hyperkalemia        Final Clinical Impression(s) / ED Diagnoses Final diagnoses:  Suicidal ideation  Hyperkalemia    Rx / DC Orders ED Discharge Orders     None         Early Glisson, MD 07/16/23 [phone]

## 2023-07-14 ENCOUNTER — Encounter (HOSPITAL_COMMUNITY): Payer: Self-pay | Admitting: Internal Medicine

## 2023-07-14 DIAGNOSIS — N186 End stage renal disease: Secondary | ICD-10-CM | POA: Diagnosis not present

## 2023-07-14 DIAGNOSIS — E1169 Type 2 diabetes mellitus with other specified complication: Secondary | ICD-10-CM | POA: Diagnosis not present

## 2023-07-14 DIAGNOSIS — F32A Depression, unspecified: Secondary | ICD-10-CM | POA: Diagnosis not present

## 2023-07-14 DIAGNOSIS — Z87898 Personal history of other specified conditions: Secondary | ICD-10-CM | POA: Diagnosis not present

## 2023-07-14 DIAGNOSIS — E875 Hyperkalemia: Secondary | ICD-10-CM

## 2023-07-14 LAB — BASIC METABOLIC PANEL WITH GFR
Anion gap: 19 — ABNORMAL HIGH (ref 5–15)
BUN: 147 mg/dL — ABNORMAL HIGH (ref 6–20)
CO2: 20 mmol/L — ABNORMAL LOW (ref 22–32)
Calcium: 9.5 mg/dL (ref 8.9–10.3)
Chloride: 106 mmol/L (ref 98–111)
Creatinine, Ser: 11.95 mg/dL — ABNORMAL HIGH (ref 0.61–1.24)
GFR, Estimated: 5 mL/min — ABNORMAL LOW (ref >60)
Glucose, Bld: 85 mg/dL (ref 70–99)
Potassium: 5.7 mmol/L — ABNORMAL HIGH (ref 3.5–5.1)
Sodium: 145 mmol/L (ref 135–145)

## 2023-07-14 LAB — CBC
HCT: 26.9 % — ABNORMAL LOW (ref 39.0–52.0)
Hemoglobin: 8.1 g/dL — ABNORMAL LOW (ref 13.0–17.0)
MCH: 23.2 pg — ABNORMAL LOW (ref 26.0–34.0)
MCHC: 30.1 g/dL (ref 30.0–36.0)
MCV: 77.1 fL — ABNORMAL LOW (ref 80.0–100.0)
Platelets: 281 10*3/uL (ref 150–400)
RBC: 3.49 MIL/uL — ABNORMAL LOW (ref 4.22–5.81)
RDW: 23.9 % — ABNORMAL HIGH (ref 11.5–15.5)
WBC: 11.5 10*3/uL — ABNORMAL HIGH (ref 4.0–10.5)
nRBC: 0 % (ref 0.0–0.2)

## 2023-07-14 LAB — GLUCOSE, CAPILLARY
Glucose-Capillary: 100 mg/dL — ABNORMAL HIGH (ref 70–99)
Glucose-Capillary: 128 mg/dL — ABNORMAL HIGH (ref 70–99)
Glucose-Capillary: 119 mg/dL — ABNORMAL HIGH (ref 70–99)
Glucose-Capillary: 163 mg/dL — ABNORMAL HIGH (ref 70–99)

## 2023-07-14 MED ORDER — DARBEPOETIN ALFA 100 MCG/0.5ML IJ SOSY
100.0000 ug | PREFILLED_SYRINGE | INTRAMUSCULAR | Status: DC
  Administered 2023-07-14: 100 ug via SUBCUTANEOUS
  Filled 2023-07-14: qty 0.5

## 2023-07-14 MED ORDER — ACETAMINOPHEN 325 MG PO TABS
650.0000 mg | ORAL_TABLET | Freq: Four times a day (QID) | ORAL | Status: DC | PRN
  Administered 2023-07-15: 650 mg via ORAL
  Filled 2023-07-14: qty 2

## 2023-07-14 MED ORDER — PRO-STAT AWC PO LIQD
30.0000 mL | Freq: Every morning | ORAL | Status: DC
Start: 2023-07-14 — End: 2023-07-14

## 2023-07-14 MED ORDER — CINACALCET HCL 30 MG PO TABS
60.0000 mg | ORAL_TABLET | ORAL | Status: DC
  Administered 2023-07-14: 60 mg via ORAL
  Filled 2023-07-14: qty 2

## 2023-07-14 MED ORDER — ACETAMINOPHEN 650 MG RE SUPP: 650.0000 mg | Freq: Four times a day (QID) | RECTAL | Status: DC | PRN

## 2023-07-14 MED ORDER — CALCIUM ACETATE (PHOS BINDER) 667 MG PO CAPS: 1334.0000 mg | ORAL_CAPSULE | Freq: Three times a day (TID) | ORAL | Status: DC

## 2023-07-14 MED ORDER — NEPRO/CARBSTEADY PO LIQD
237.0000 mL | Freq: Three times a day (TID) | ORAL | Status: DC
Start: 2023-07-14 — End: 2023-07-16
  Administered 2023-07-15: 237 mL via ORAL

## 2023-07-14 MED ORDER — PENTOXIFYLLINE ER 400 MG PO TBCR: 400.0000 mg | EXTENDED_RELEASE_TABLET | Freq: Three times a day (TID) | ORAL | Status: DC

## 2023-07-14 MED ORDER — PREGABALIN 50 MG PO CAPS
50.0000 mg | ORAL_CAPSULE | Freq: Every day | ORAL | Status: DC
  Administered 2023-07-14 – 2023-07-15 (×2): 50 mg via ORAL
  Filled 2023-07-14 (×2): qty 1

## 2023-07-14 MED ORDER — PROSOURCE PLUS PO LIQD
30.0000 mL | Freq: Every day | ORAL | Status: DC
  Administered 2023-07-15: 30 mL via ORAL
  Filled 2023-07-14 (×2): qty 30

## 2023-07-14 MED ORDER — INSULIN ASPART 100 UNIT/ML IJ SOLN: 0.0000 [IU] | Freq: Three times a day (TID) | INTRAMUSCULAR | Status: DC

## 2023-07-14 MED ORDER — SODIUM ZIRCONIUM CYCLOSILICATE 10 G PO PACK
10.0000 g | PACK | Freq: Three times a day (TID) | ORAL | Status: DC
Start: 2023-07-14 — End: 2023-07-16
  Administered 2023-07-14 – 2023-07-15 (×6): 10 g via ORAL
  Filled 2023-07-14 (×6): qty 1

## 2023-07-14 MED ORDER — HYDRALAZINE HCL 25 MG PO TABS
25.0000 mg | ORAL_TABLET | Freq: Three times a day (TID) | ORAL | Status: DC
  Administered 2023-07-14 – 2023-07-15 (×5): 25 mg via ORAL
  Filled 2023-07-14 (×6): qty 1

## 2023-07-14 MED ORDER — LIDOCAINE-PRILOCAINE 2.5-2.5 % EX CREA: 1.0000 | TOPICAL_CREAM | CUTANEOUS | Status: DC

## 2023-07-14 MED ORDER — ATORVASTATIN CALCIUM 40 MG PO TABS
80.0000 mg | ORAL_TABLET | Freq: Every day | ORAL | Status: DC
Start: 2023-07-14 — End: 2023-07-16
  Administered 2023-07-14 – 2023-07-15 (×3): 80 mg via ORAL
  Filled 2023-07-14 (×3): qty 2

## 2023-07-14 MED ORDER — LEVETIRACETAM 500 MG PO TABS
750.0000 mg | ORAL_TABLET | ORAL | Status: DC
  Administered 2023-07-14: 750 mg via ORAL
  Filled 2023-07-14 (×2): qty 1

## 2023-07-14 MED ORDER — ONDANSETRON HCL 4 MG/2ML IJ SOLN: 4.0000 mg | Freq: Four times a day (QID) | INTRAMUSCULAR | Status: DC | PRN

## 2023-07-14 MED ORDER — AMLODIPINE BESYLATE 5 MG PO TABS
5.0000 mg | ORAL_TABLET | Freq: Every day | ORAL | Status: DC
  Administered 2023-07-14 – 2023-07-15 (×2): 5 mg via ORAL
  Filled 2023-07-14 (×2): qty 1

## 2023-07-14 MED ORDER — OXYCODONE HCL 5 MG PO TABS
5.0000 mg | ORAL_TABLET | ORAL | Status: DC | PRN
Start: 2023-07-14 — End: 2023-07-14
  Administered 2023-07-14: 5 mg via ORAL
  Filled 2023-07-14: qty 1

## 2023-07-14 MED ORDER — PENTOXIFYLLINE ER 400 MG PO TBCR
400.0000 mg | EXTENDED_RELEASE_TABLET | Freq: Three times a day (TID) | ORAL | Status: DC
  Administered 2023-07-14 – 2023-07-15 (×6): 400 mg via ORAL
  Filled 2023-07-14 (×6): qty 1

## 2023-07-14 MED ORDER — RENA-VITE PO TABS
1.0000 | ORAL_TABLET | Freq: Every day | ORAL | Status: DC
  Administered 2023-07-14 – 2023-07-15 (×2): 1 via ORAL
  Filled 2023-07-14 (×3): qty 1

## 2023-07-14 MED ORDER — CALCITRIOL 0.25 MCG PO CAPS
2.2500 ug | ORAL_CAPSULE | ORAL | Status: DC
  Administered 2023-07-14: 2.25 ug via ORAL
  Filled 2023-07-14: qty 9

## 2023-07-14 MED ORDER — LEVETIRACETAM 500 MG PO TABS
500.0000 mg | ORAL_TABLET | ORAL | Status: DC
Start: 2023-07-15 — End: 2023-07-16
  Administered 2023-07-15: 500 mg via ORAL
  Filled 2023-07-14: qty 1

## 2023-07-14 MED ORDER — BRIMONIDINE TARTRATE 0.2 % OP SOLN
1.0000 [drp] | Freq: Two times a day (BID) | OPHTHALMIC | Status: DC
  Administered 2023-07-14 – 2023-07-15 (×5): 1 [drp] via OPHTHALMIC
  Filled 2023-07-14 (×2): qty 5

## 2023-07-14 MED ORDER — DORZOLAMIDE HCL-TIMOLOL MAL 2-0.5 % OP SOLN
1.0000 [drp] | Freq: Two times a day (BID) | OPHTHALMIC | Status: DC
  Administered 2023-07-14 – 2023-07-15 (×5): 1 [drp] via OPHTHALMIC
  Filled 2023-07-14 (×2): qty 10

## 2023-07-14 MED ORDER — HEPARIN SODIUM (PORCINE) 5000 UNIT/ML IJ SOLN
5000.0000 [IU] | Freq: Three times a day (TID) | INTRAMUSCULAR | Status: DC
  Administered 2023-07-14 – 2023-07-15 (×6): 5000 [IU] via SUBCUTANEOUS
  Filled 2023-07-14 (×6): qty 1

## 2023-07-14 MED ORDER — OXYCODONE HCL 5 MG PO TABS: 5.0000 mg | ORAL_TABLET | Freq: Four times a day (QID) | ORAL | Status: DC | PRN

## 2023-07-14 MED ORDER — CALCIUM ACETATE (PHOS BINDER) 667 MG PO CAPS
1334.0000 mg | ORAL_CAPSULE | Freq: Three times a day (TID) | ORAL | Status: DC
  Administered 2023-07-14 – 2023-07-15 (×5): 1334 mg via ORAL
  Filled 2023-07-14 (×5): qty 2

## 2023-07-14 MED ORDER — PROSOURCE PLUS PO LIQD
30.0000 mL | Freq: Three times a day (TID) | ORAL | Status: DC
  Administered 2023-07-14: 30 mL via ORAL
  Filled 2023-07-14: qty 30

## 2023-07-14 MED ORDER — ONDANSETRON HCL 4 MG PO TABS: 4.0000 mg | ORAL_TABLET | Freq: Four times a day (QID) | ORAL | Status: DC | PRN

## 2023-07-14 MED ORDER — ZINC SULFATE 220 (50 ZN) MG PO CAPS
220.0000 mg | ORAL_CAPSULE | Freq: Every morning | ORAL | Status: DC
  Administered 2023-07-14 – 2023-07-15 (×2): 220 mg via ORAL
  Filled 2023-07-14 (×2): qty 1

## 2023-07-14 MED ORDER — CLONIDINE HCL 0.2 MG PO TABS
0.3000 mg | ORAL_TABLET | Freq: Three times a day (TID) | ORAL | Status: DC
  Administered 2023-07-14 – 2023-07-15 (×6): 0.3 mg via ORAL
  Filled 2023-07-14 (×6): qty 1

## 2023-07-14 MED ORDER — CARVEDILOL 12.5 MG PO TABS
12.5000 mg | ORAL_TABLET | Freq: Two times a day (BID) | ORAL | Status: DC
  Administered 2023-07-14 – 2023-07-15 (×5): 12.5 mg via ORAL
  Filled 2023-07-14 (×5): qty 1

## 2023-07-14 MED ORDER — CHLORHEXIDINE GLUCONATE CLOTH 2 % EX PADS
6.0000 | MEDICATED_PAD | Freq: Every day | CUTANEOUS | Status: DC
  Administered 2023-07-14: 6 via TOPICAL

## 2023-07-14 MED ORDER — AMIODARONE HCL 200 MG PO TABS
200.0000 mg | ORAL_TABLET | Freq: Every day | ORAL | Status: DC
  Administered 2023-07-14 – 2023-07-15 (×2): 200 mg via ORAL
  Filled 2023-07-14 (×2): qty 1

## 2023-07-14 NOTE — Progress Notes (Signed)
 Pt goal not met d/t patient cut off 45 mins. The patient shouts, " let me get out of here." Dr. Irene Mannheim has been notified and he  has approved to stop hemodialysis  treatment.  07/14/23 1822  Vitals  Temp 98 F (36.7 C)  Temp Source Oral  BP (!) 117/58  BP Location Right Arm  BP Method Automatic  Patient Position (if appropriate) Lying  Pulse Rate (!) 58  Resp 16  Oxygen Therapy  SpO2 100 %  O2 Device Room Air  During Treatment Monitoring  Intra-Hemodialysis Comments See progress note  Post Treatment  Dialyzer Clearance Lightly streaked  Hemodialysis Intake (mL) 0 mL  Liters Processed 60  Fluid Removed (mL) 1700 mL  Tolerated HD Treatment Yes  Post-Hemodialysis Comments see  notes  AVG/AVF Arterial Site Held (minutes) 7 minutes  AVG/AVF Venous Site Held (minutes) 7 minutes  Fistula / Graft Left Upper arm  No placement date or time found.   Orientation: Left  Access Location: Upper arm  Site Condition No complications  Fistula / Graft Assessment Present;Thrill;Bruit  Status Deaccessed  Needle Size 15  Drainage Description None

## 2023-07-14 NOTE — Progress Notes (Signed)
 Progress Note   Patient: [name] Baril Jr. ZOX:[id number] DOB: [birth year] DOA: 07/13/2023     0 DOS: the patient was seen and examined on 07/14/2023   Brief hospital admission narrative course: As per H&P written by Dr. Sunnie England on 07/13/2023 [name] Olamide Carattini. is a 53 y.o. male with medical history significant of ESRD on HD, (T TH Sat), bilateral AKA, T2DM,  coronary artery diease, heart failure, paroxysmal atrial fibrillation, and seizures who presented from the SNF due to suicidal thoughts.  Recent hospitalization 04/28 to 07/08/23 for left lower extremity wound infection that required AKA.  He was discharged to SNF.  Today he expressed to nursing home staff that he was willing to end his life. EMS was called and patient was transported to the ED.  Apparently he did not complete his HD session yesterday due to buttock pain.    In the emergency department he denies being suicidal and not willing to harm him self or others.  At the time of my examination he is only answering to simple questions, denies any pain or dyspnea.   Assessment and plan ESRD on hemodialysis (HCC)/hyperkalemia -Patient without signs of fluid overload on exam - After receiving Lokelma  potassium 5.7 and due for hemodialysis today; usual days of the week Tuesday-Thursday and Saturday. - Nephrology service has been consulted - Continue telemetry monitoring for now - Follow clinical response.  Anemia of chronic renal disease,  - No overt bleeding appreciated - Hemoglobin stable - Continue to follow hemoglobin trend/stability - IV iron and Epogen  therapy as per nephrology discretion.    Metabolic bone disease,  -continue with phoslo , cinacalcet  and calcitriol .  -Follow nephrology service recommendation.   CAD/ H/o prior STEMI 02/2020 s/p DES to LAD -No shortness of breath and no chest pain reported - Telemetry without ischemic change - Will continue treatment with aspirin  and statin   Essential hypertension,  benign -Continue current antihypertensive regimen.  Paroxysmal atrial fibrillation (HCC) - not on systemic anticoagulation due to ICH while on Eliquis  in 11-2021. -Continue amiodarone  for rate control and continue daily aspirin  - No chronic anticoagulation secondary to prior history of ICH.  Type 2 diabetes mellitus with hyperlipidemia (HCC) -Follow CBG fluctuation and further adjust hypoglycemic regimen - Sliding scale insulin  has been ordered - Continue statin.  History of CVA (cerebrovascular accident) - with ICH while on plavix  and Eliquis  in 11-2021. -Continue aspirin  for secondary prevention - Continue statin, blood pressure control and risk factor modification - No neurodeficits.  History of seizure -Continue Keppra   Moderate protein-calorie malnutrition (HCC) -Continue feeding supplements.  Depression/presumed suicidal thoughts at time of admission -Patient denies any suicidal thoughts - Psychiatry service has been consulted and will follow up recommendations. - Currently not taking any antidepressants.  Subjective:  Afebrile, no chest pain, no nausea, no vomiting.  Patient reports no shortness of breath.  Flat affect and no overnight events.  Sitter at bedside.  Physical Exam: Vitals:   07/14/23 0403 07/14/23 0413 07/14/23 0604 07/14/23 0842  BP:  (!) 106/58 127/70 124/67  Pulse:  61  60  Resp:  18    Temp:  98.1 F (36.7 C)    TempSrc:      SpO2:  92%    Weight: 68.8 kg     Height: 3' 8.88" (1.14 m)      General exam: Sleepy after receiving pain medication; in no acute distress. Respiratory system: Clear to auscultation. Respiratory effort normal.  Good saturation on room air. Cardiovascular system:RRR. No rubs  or gallops; no JVD. Gastrointestinal system: Abdomen is nondistended, soft and nontender. No organomegaly or masses felt. Normal bowel sounds heard. Central nervous system:  No focal neurological deficits. Extremities: No edema; bilateral AKA  appreciated. Skin: No petechiae. Psychiatry: Flat affect on exam.   Data Reviewed: Basic metabolic panel: Sodium 145, potassium 5.7, chloride 106, bicarb 20, BUN 147, creatinine 11.95 and GFR 19.  Family Communication: No family at bedside.  Disposition: Status is: Observation The patient remains OBS appropriate and will d/c before 2 midnights.  Anticipated return back to skilled nursing facility on 07/15/2023.  Time spent: 50 minutes  Author: Justina Oman, MD 07/14/2023 12:23 PM  For on call review [email or web address].

## 2023-07-14 NOTE — Progress Notes (Signed)
 Initial Nutrition Assessment  DOCUMENTATION CODES:   Not applicable; suspect moderate malnutrition   INTERVENTION:   Recommend stop zinc  sulfate supplementation. Recommended length of supplementation is 14 days to prevent copper deficiency and patient has received for at least 14 days as it started during his last hospitalization.    Nepro Shake po TID, each supplement provides 425 kcal and 19 grams protein.  Prosource Plus 30 ml PO daily, each packet provides 100 kcal and 15 gm protein.  NUTRITION DIAGNOSIS:   Increased nutrient needs related to chronic illness, wound healing as evidenced by estimated needs.  GOAL:   Patient will meet greater than or equal to 90% of their needs  MONITOR:   PO intake, Supplement acceptance, Skin  REASON FOR ASSESSMENT:   Consult Assessment of nutrition requirement/status  ASSESSMENT:   53 yo male admitted from SNF d/t suicidal thoughts. PMH includes ESRD on HD T-T-S, bil AKA, DM-2, CAD, HF, PAF, seizures, HTN, HLD, chronic diarrhea, gastroparesis.  Recent hospitalization 4/28-5/16 for LE wound infection that required AKA on 5/2. He was identified to have moderate malnutrition with mild fat depletion, moderate muscle depletion and 5.5% weight loss. Suspect this is ongoing. Unable to obtain enough information at this time for identification of malnutrition.  Patient is currently on a heart healthy diet with meal completions documented at 75%.   Labs reviewed. K 5.7, BUN 147, creatinine 11.95 CBG: 100-128  Medications reviewed and include rocaltrol , phoslo , sensipar , aranesp , novolog . Lokelma , zinc  sulfate.   Weight history reviewed. Current weight appears to be carried over from last admission. Patient has had some weight loss over the past few months, at least partially r/t recent amputation.   NUTRITION - FOCUSED PHYSICAL EXAM:  Unable to complete  Diet Order:   Diet Order             Diet Heart Room service appropriate? Yes;  Fluid consistency: Thin  Diet effective now                   EDUCATION NEEDS:   Education needs have been addressed  Skin:  Skin Assessment: Skin Integrity Issues: Skin Integrity Issues:: Stage II Stage II: sacrum  Last BM:  5/22 type 6  Height:   Ht Readings from Last 1 Encounters:  07/14/23 6\' 3"  (1.905 m)   Weight:   Wt Readings from Last 1 Encounters:  07/14/23 68.8 kg    Ideal Body Weight:  74.8 kg (adjusted for bil AKA)  BMI:  Body mass index is 22.6 kg/m (adjusted for bil AKA).  Estimated Nutritional Needs:   Kcal:  4098-1191  Protein:  110-125 gm  Fluid:  1 L + UOP   Barnet Boots RD, LDN, CNSC Contact via secure chat. If unavailable, use group chat "RD Inpatient."

## 2023-07-14 NOTE — Care Management Obs Status (Signed)
 MEDICARE OBSERVATION STATUS NOTIFICATION   Patient Details  Name: [name]. MRN: [id number] Date of Birth: 1970-06-29   Medicare Observation Status Notification Given:  Yes    Geraldina Klinefelter, RN 07/14/2023, 8:44 PM
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
## 2023-07-14 NOTE — Progress Notes (Signed)
 LCSW Progress Note  [id number]   [name].  07/14/2023  11:25 AM  Description:   Inpatient Psychiatric Referral  Patient was recommended inpatient per Bonnita Buttner NP. There are no available beds at [facility], per [facility] Bevin Bucks RN. Patient was referred to the following out of network facilities:    [facility] Provider Address Phone Fax  [facility] [address], [place] [address] [phone]  [facility] [address] [place] [address] [phone]  [facility] [address] [place], [place] [address] [phone]  [facility] [address], [place] [address] [phone]  [facility] [address] [place] [address] [phone]  [facility] [address], [place] [address] [phone]  [facility] EFAX [address] [place], [place] [phone]  [facility] [address] [place] [address] [phone]  [facility] [address] [place] [address] [phone]  [facility] [address], [place] [address] [phone]      Situation ongoing, CSW to continue following and update chart as more information becomes available.      [place] Ramie Palladino MSW, LCSW  07/14/2023 11:25 AM

## 2023-07-14 NOTE — TOC Initial Note (Signed)
 Transition of Care (TOC) - Initial/Assessment Note    Patient Details  Name: [name]. MRN: [id number] Date of Birth: [birth year]  Transition of Care [facility]) CM/SW Contact:    Juanda Noon Phone Number: 07/14/2023, 8:19 AM  Clinical Narrative:                  This Clinical research associate attempted to speak with patient but he was sleeping per sitter. Writer called spouse and assessed patient. Spouse shared that [facility] is currently at [facility] for [facility]-term rehab. Spouse states that she assist patient with ADL's when he was home. Spouse states that patient has a lift , hospital bed, WC, and sliding board. TOC will follow up with [facility] after progression.    Expected Discharge Plan: Skilled Nursing Facility Barriers to Discharge: Continued Medical Work up   Patient Goals and CMS Choice Patient states their goals for this hospitalization and ongoing recovery are:: DC back to [facility] CMS Medicare.gov Compare Post Acute Care list provided to:: Patient Represenative (must comment) (Spouse - [name])        Expected Discharge Plan and Services In-house Referral: Clinical Social Work Discharge Planning Services: CM Consult Post Acute Care Choice: Skilled Nursing Facility Living arrangements for the past 2 months: Skilled Nursing Facility, Single Family Home                 DME Arranged: N/A DME Agency: [employer]                  Prior Living Arrangements/Services Living arrangements for the past 2 months: Skilled Nursing Facility, Single Family Home Lives with:: Spouse, Facility Resident Patient language and need for interpreter reviewed:: Yes Do you feel safe going back to the place where you live?: Yes      Need for Family Participation in Patient Care: Yes (Comment) Care giver support system in place?: Yes (comment) Current home services: DME, Home OT, Home PT Criminal Activity/Legal Involvement Pertinent to Current Situation/Hospitalization: No - Comment as needed  Activities  of Daily Living   ADL Screening (condition at time of admission) Independently performs ADLs?: No Does the patient have a NEW difficulty with bathing/dressing/toileting/self-feeding that is expected to last >3 days?: No Does the patient have a NEW difficulty with getting in/out of bed, walking, or climbing stairs that is expected to last >3 days?: No Does the patient have a NEW difficulty with communication that is expected to last >3 days?: No Is the patient deaf or have difficulty hearing?: No Does the patient have difficulty seeing, even when wearing glasses/contacts?: Yes Does the patient have difficulty concentrating, remembering, or making decisions?: Yes  Permission Sought/Granted      Share Information with NAME: [name]     Permission granted to share info w Relationship: Spouse     Emotional Assessment Appearance:: Other (Comment Required, Appears stated age (Patient was sleep)     Orientation: : Oriented to Self, Oriented to Place, Oriented to  Time, Oriented to Situation Alcohol  / Substance Use: Not Applicable Psych Involvement: Yes (comment) (Patient had a BH assessment done around 12AM)  Admission diagnosis:  Hyperkalemia [E87.5] Patient Active Problem List   Diagnosis Date Noted   Hyperkalemia 07/13/2023   Depression 07/13/2023   Malnutrition of moderate degree 06/29/2023   Gangrene of left foot (HCC) 06/24/2023   Syncope 06/21/2023   Hypoglycemia 06/21/2023   Ischemic ulcer of left heel (HCC) 06/21/2023   Failure to thrive in adult 06/21/2023   Generalized weakness 05/22/2023  Hx of AKA (above knee amputation), right (HCC) 05/11/2023   Pleural effusion 05/05/2023   Seizures (HCC) 05/05/2023   Right BKA infection (HCC) 04/04/2023   Pressure injury of skin 04/04/2023   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 difficile colitis 05/05/2021   Steal syndrome as complication of dialysis access (HCC) 02/06/2021   Coronary artery disease 02/05/2021   Dehiscence of amputation stump of right lower extremity (HCC) 01/29/2021   ESRD on hemodialysis (HCC)    Anemia due to chronic kidney disease, on chronic dialysis (HCC)    Paroxysmal atrial fibrillation (HCC) - not on systemic anticoagulation due to ICH while on Eliquis  in 11-2021. 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) 08/19/2020   Anxiety state 08/19/2020   Callosity 08/19/2020   ED (erectile dysfunction) of organic origin 08/19/2020   Long term (current) use of insulin  (HCC) 08/19/2020   Myalgia 08/19/2020   CAD/ H/o prior STEMI 02/2020 s/p DES to LAD 08/19/2020   Blindness of left eye 08/15/2020   Combined forms of age-related cataract of right eye 04/17/2020   Moderate protein-calorie malnutrition (HCC) 05/14/2019   Disorder of phosphorus metabolism, unspecified 05/07/2019   Iron deficiency anemia, unspecified 05/07/2019   Secondary hyperparathyroidism of renal origin (HCC) 11/28/2018   Chronic Grade 2 diastolic heart failure/EF 55 to 60% 12/13/2017   Diabetes mellitus, type II (HCC) 11/23/2017   Diabetic retinopathy (HCC) 07/05/2017   Type 2 diabetes mellitus with hyperlipidemia (HCC) 04/20/2016   Mixed hyperlipidemia    Essential hypertension, benign    PCP:  Ayesha Lente, FNP Pharmacy:   [facility] -  [place], [place] - [address] [place] [address] Phone: [phone] Fax: [phone]  [facility] [address] [place] [address] Phone: [phone] Fax: [phone]     Social Drivers of Health (SDOH) Social History: SDOH Screenings   Food Insecurity: Patient Declined (07/14/2023)  Housing: Patient Declined (07/14/2023)  Recent Concern: Housing - High Risk (05/22/2023)  Transportation Needs: Patient Declined (07/14/2023)  Utilities: Patient Declined (07/14/2023)  Financial Resource Strain: High Risk (07/05/2017)  Physical Activity: Inactive (07/05/2017)  Social Connections: Patient Declined (07/14/2023)  Stress: Stress Concern Present (07/05/2017)  Tobacco Use: Low Risk  (07/14/2023)  Health Literacy: Low Risk  (06/01/2020)   [facility] from [facility]   SDOH Interventions:     Readmission Risk Interventions    06/25/2023    9:22 AM 06/21/2023   10:51 AM 05/17/2023    3:44 PM  Readmission Risk Prevention Plan  Transportation Screening Complete Complete Complete  Medication Review (RN Care Manager) Complete Complete Referral to Pharmacy  PCP or Specialist appointment within 3-5 days of discharge Complete  Complete  HRI or Home Care Consult Complete Complete Complete  SW Recovery Care/Counseling Consult Complete Complete Complete  Palliative Care Screening Not Applicable Not Applicable Not Applicable  Skilled Nursing Facility Complete Not Applicable Not Applicable

## 2023-07-14 NOTE — Consult Note (Signed)
 [facility] Renal Consultation Note    Indication for Consultation:  Management of ESRD/hemodialysis; anemia, hypertension/volume and secondary hyperparathyroidism  HPI: [name]. is a 53 y.o. male with a PMH significant for ESRD ([facility]), PAD s/p bilateral AKA, DM type 2, HTN, CAD, CHF, paroxysmal atrial fibrillation, seizures, and noncompliance who was recently admitted to [facility] from 4/28-5/16/25 with infected ischemic ulcer of left heel s/p left AKA on 06/24/23 and was discharged to [facility] on 07/08/23.  He presented to [facility] ED via EMS from his facility on 07/13/23 with suicidal ideations.  He did not go to HD on 07/12/23 due to complaints of pain. He has been admitted and we were consulted to provide dialysis during his hospitalization.   He will not answer questions this morning.  Doesn't know why he didn't go to dialysis on Tuesday and states that he doesn't want to go to dialysis today.  Past Medical History:  Diagnosis Date   Acute Bil  CVAs of Bil Frontal amd Rt Parietal  in Watershed Distribution 04/21/2021   Patchy acute cortically-based infarcts within the bilateral high  frontal lobes and right parietal lobe (predominantly in a watershed  distribution).   Subcentimeter acute infarct within the callosal splenium on the  left.     Acute bilateral cerebral infarction in a watershed distribution [facility]) 04/21/2021   a.) MRI brain 04/21/2021 --> patchy ACUTE cortically-based infarcts within the bilateral high frontal lobes and right parietal lobe   Acute cerebral infarction (HCC) 02/13/2021   a.) MRI brain 02/13/2021: acute LEFT hippocampal infarct   Acute cerebral infarction (HCC) 04/21/2021   a.) MRI brain 04/21/2021: subcentimeter ACUTE infarct within the callosal splenium on the LEFT   Acute osteomyelitis of toe of right foot with gangrene (HCC) 02/26/2022   Anaphylactic reaction due to adverse effect of correct drug or medicament properly  administered, initial encounter 05/08/2019   Anemia of chronic renal failure    Anxiety    Aortic dilatation (HCC) 11/25/2020   a.) TTE 11/25/2020: Ao root measured 38 mm. b.) TTE 04/22/2021: Ao root measured 44 mm; c. 11/2021 Echo: Ao root 40mm.   Atrial fibrillation (HCC)    a.) CHA2DS2-VASc = 6 (CHF, HTN, CVA x2, prior MI, T2DM). b.) rate/rhythm maintained on oral amiodarone  + carvedilol ; chronic OAC/AP therapy discontinued following ICH   Benign prostatic hyperplasia with lower urinary tract symptoms 11/07/2019   BPH (benign prostatic hyperplasia)    Cardiac arrest (HCC) 07/26/2021   a.) in setting of hyperkalemia, NSTEMI, and seizure following missed HD; pulseless and apneic --> required 1 round of CPR prior to ROSC   Cellulitis    Cerebral hemorrhage (HCC) 11/21/2021   a.) CT head 11/21/2021 --> 1.4 x 1.5 x 3.1 cm ACUTE hemorrhage within the inferior aspect of the fourth ventricle, and extending inferiorly through the foramen of foramen of [place] --> occurred in setting of HTN emergency and DOAC/APT-->eliquis /plavix  d/c'd.   Cerebral microvascular disease    Chronic diarrhea    Chronic heart failure with preserved ejection fraction (HFpEF) (HCC) 07/19/2017   a.) 06/2017 Echo: EF 75%; b.) 11/2020 Echo: EF 55-60%; c.) 04/2021 Echo: EF 55-60%; d.) 11/2021 Echo: EF 65-70%, GrII DD, nl RV fxn, sev dil LA, large circumferential pericardial eff w/o tamponade, triv MR, AoV sclerosis. Ao root 40mm; e.) TTE 02/28/2025: EF 60-65%, sev LVH, mod posterior LV effusion, AoV sclerosis   Chronic pain of both ankles    Coronary artery disease 02/26/2020   a.  02/2020 Ant STEMI/PCI: LM nl, LAD 60m (3.5x30 Resolute Onyx), RI nl, LCX min irregs, RCA min irregs. EF 65%.   DDD (degenerative disc disease), cervical    Diabetes mellitus, type II (HCC)    Diabetic neuropathy (HCC)    Esophagitis    ESRD (end stage renal disease) on dialysis (HCC)    a.) [facility]; T-Th-Sat   Frequent falls    Gait instability     Gastritis    Gastroparesis    GERD (gastroesophageal reflux disease)    H/O enucleation of left eyeball    Heart palpitations    History of kidney stones    HLD (hyperlipidemia)    Hypertension    Insomnia    a.) uses melatonin PRN   Long term current use of amiodarone     Nausea and vomiting in adult    recurrent   NSTEMI (non-ST elevated myocardial infarction) (HCC) 07/26/2021   a.) hyperkalemic from missed HD; seizures; decompensated to cardiac arrest and required CPR x 1 round prior to ROSC.   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  Added automatically from request for surgery [id number]     Osteomyelitis [facility])    Pericardial effusion    a. 11/2021 Echo: EF 65-70%, GrII DD, nl RV fxn, sev dil LA, large circumferential pericardial eff w/o tamponade, triv MR, AoV sclerosis. Ao root 40mm.   Seizure (HCC)    a.) last 07/26/2021 in setting of missed HD --> hyperkalemic at 6.5 --> pulseless/apneic and required CPR; discharged home on levetiracetam .   ST elevation myocardial infarction (STEMI) of anterior wall (HCC) 02/26/2020   a.) LHC/PCI 02/26/2020 --> EF 65%; LVEDP 11 mmHg; 90% mLAD (3.5 x 20 mm Resolute Onyx DES x 1)   Past Surgical History:  Procedure Laterality Date   A/V FISTULAGRAM Left 08/31/2021   Procedure: A/V Fistulagram;  Surgeon: Celso College, MD;  Location: [name] INVASIVE CV LAB;  Service: Cardiovascular;  Laterality: Left;   A/V FISTULAGRAM Left 01/25/2022   Procedure: A/V Fistulagram;  Surgeon: Celso College, MD;  Location: [name] INVASIVE CV LAB;  Service: Cardiovascular;  Laterality: Left;   A/V FISTULAGRAM Right 10/18/2022   Procedure: A/V Fistulagram;  Surgeon: Celso College, MD;  Location: [name] INVASIVE CV LAB;  Service: Cardiovascular;  Laterality: Right;   ABDOMINAL AORTOGRAM W/LOWER EXTREMITY Bilateral 02/08/2023   Procedure: ABDOMINAL AORTOGRAM W/LOWER EXTREMITY;  Surgeon: Philipp Brawn, MD;  Location: [facility] INVASIVE CV LAB;  Service: Vascular;  Laterality: Bilateral;   AMPUTATION Left 03/16/2016   Procedure: AMPUTATION DIGIT LEFT HALLUX;  Surgeon: Charity Conch, DPM;  Location: [facility] OR;  Service: Podiatry;  Laterality: Left;  can start around 5    AMPUTATION Right 02/09/2021   Procedure: AMPUTATION DIGIT;  Surgeon: Marilyn Shropshire, MD;  Location: [facility] OR;  Service: Orthopedics;  Laterality: Right;   AMPUTATION Right 02/04/2023   Procedure: AMPUTATION 4TH AND 5TH METATARSAL;  Surgeon: Evertt Hoe, DPM;  Location: [facility] OR;  Service: Orthopedics/Podiatry;  Laterality: Right;  I&D, partial ray resection, abx beads   AMPUTATION Right 02/09/2023   Procedure: RIGHT BELOW KNEE AMPUTATION;  Surgeon: Timothy Ford, MD;  Location: [facility] OR;  Service: Orthopedics;  Laterality: Right;   AMPUTATION Right 04/06/2023   Procedure: RIGHT ABOVE KNEE AMPUTATION;  Surgeon: Timothy Ford, MD;  Location: [facility] OR;  Service: Orthopedics;  Laterality: Right;   AMPUTATION Left 06/24/2023   Procedure: AMPUTATION, ABOVE KNEE;  Surgeon: Timothy Ford, MD;  Location: [facility] OR;  Service: Orthopedics;  Laterality: Left;   AMPUTATION Right 05/11/2023   Procedure: RIGHT ABOVE KNEE AMPUTATION;  Surgeon: Timothy Ford, MD;  Location: [facility] OR;  Service: Orthopedics;  Laterality: Right;  REVISION RIGHT ABOVE KNEE AMPUTATION   AMPUTATION TOE Right 02/28/2022   Procedure: AMPUTATION TOE;  Surgeon: Jennefer Moats, DPM;  Location: [name] ORS;  Service: Podiatry;  Laterality: Right;  right fifth toe   ARTHROSCOPIC REPAIR ACL Left    AV FISTULA PLACEMENT Right 05/31/2019   Procedure: Brachiocephalic AV fistula creation;  Surgeon: Celso College, MD;  Location: [name] ORS;  Service: Vascular;  Laterality: Right;   AV FISTULA PLACEMENT Left 08/13/2021   Procedure: ARTERIOVENOUS (AV) FISTULA CREATION (BRACHIALCEPHALIC);  Surgeon: Celso College, MD;  Location: [name] ORS;  Service: Vascular;  Laterality: Left;   CHEST TUBE  INSERTION Right 05/06/2023   Procedure: CHEST TUBE INSERTION;  Surgeon: [name] Nigh, MD;  Location: [facility] ENDOSCOPY;  Service: Pulmonary;  Laterality: Right;   COLONOSCOPY WITH PROPOFOL  N/A 10/28/2015   Procedure: COLONOSCOPY WITH PROPOFOL ;  Surgeon: Deveron Fly, MD;  Location: [facility] ENDOSCOPY;  Service: Endoscopy;  Laterality: N/A;   COLONOSCOPY WITH PROPOFOL  N/A 10/29/2015   Procedure: COLONOSCOPY WITH PROPOFOL ;  Surgeon: Deveron Fly, MD;  Location: [facility] ENDOSCOPY;  Service: Endoscopy;  Laterality: N/A;   COLONOSCOPY WITH PROPOFOL  N/A 01/27/2023   Procedure: COLONOSCOPY WITH PROPOFOL ;  Surgeon: Hargis Lias, MD;  Location: [facility] ENDO [facility]ITE;  Service: Endoscopy;  Laterality: N/A;  12:30pm, asa 3   CORONARY ULTRASOUND/IVUS N/A 02/26/2020   Procedure: Intravascular Ultrasound/IVUS;  Surgeon: Lucendia Rusk, MD;  Location: [facility] INVASIVE CV LAB;  Service: Cardiovascular;  Laterality: N/A;   CORONARY/GRAFT ACUTE MI REVASCULARIZATION N/A 02/26/2020   Procedure: Coronary/Graft Acute MI Revascularization;  Surgeon: Lucendia Rusk, MD;  Location: [facility] INVASIVE CV LAB;  Service: Cardiovascular;  Laterality: N/A;   DIALYSIS/PERMA CATHETER INSERTION Right 04/26/2019   Perm Cath    DIALYSIS/PERMA CATHETER INSERTION N/A 04/26/2019   Procedure: DIALYSIS/PERMA CATHETER INSERTION;  Surgeon: Celso College, MD;  Location: [name] INVASIVE CV LAB;  Service: Cardiovascular;  Laterality: N/A;   DIALYSIS/PERMA CATHETER INSERTION N/A 05/25/2021   Procedure: DIALYSIS/PERMA CATHETER INSERTION;  Surgeon: Celso College, MD;  Location: [name] INVASIVE CV LAB;  Service: Cardiovascular;  Laterality: N/A;   DIALYSIS/PERMA CATHETER INSERTION N/A 08/31/2021   Procedure: DIALYSIS/PERMA CATHETER INSERTION;  Surgeon: Celso College, MD;  Location: [name] INVASIVE CV LAB;  Service: Cardiovascular;  Laterality: N/A;   DIALYSIS/PERMA CATHETER REMOVAL N/A 08/15/2019   Procedure: DIALYSIS/PERMA CATHETER REMOVAL;  Surgeon:  Jackquelyn Mass, MD;  Location: [name] INVASIVE CV LAB;  Service: Cardiovascular;  Laterality: N/A;   DIALYSIS/PERMA CATHETER REMOVAL N/A 03/15/2022   Procedure: DIALYSIS/PERMA CATHETER REMOVAL;  Surgeon: Celso College, MD;  Location: [name] INVASIVE CV LAB;  Service: Cardiovascular;  Laterality: N/A;   EMBOLIZATION (CATH LAB) Right 02/06/2021   Procedure: EMBOLIZATION;  Surgeon: Celso College, MD;  Location: [name] INVASIVE CV LAB;  Service: Cardiovascular;  Laterality: Right;  Right Upper Extremity Dialysis Access, Permcath Placement   ESOPHAGOGASTRODUODENOSCOPY (EGD) WITH PROPOFOL  N/A 12/27/2017   Procedure: ESOPHAGOGASTRODUODENOSCOPY (EGD) WITH PROPOFOL ;  Surgeon: Toledo, Alphonsus Jeans, MD;  Location: [name] ENDOSCOPY;  Service: Gastroenterology;  Laterality: N/A;   EYE SURGERY     IR THORACENTESIS ASP PLEURAL SPACE W/IMG GUIDE  05/05/2023   LEFT HEART CATH AND CORONARY ANGIOGRAPHY N/A 02/26/2020   Procedure: LEFT HEART CATH  AND CORONARY ANGIOGRAPHY;  Surgeon: Lucendia Rusk, MD;  Location: [facility] INVASIVE CV LAB;  Service: Cardiovascular;  Laterality: N/A;   PROSTATE SURGERY  2016   REVISON OF ARTERIOVENOUS FISTULA Right 07/22/2022   Procedure: RESECTION OF ANEURYSMAL RIGHT ARM ARTERIOVENOUS FISTULA;  Surgeon: Celso College, MD;  Location: [name] ORS;  Service: Vascular;  Laterality: Right;   TONSILECTOMY/ADENOIDECTOMY WITH MYRINGOTOMY     TONSILLECTOMY     UPPER EXTREMITY ANGIOGRAPHY Right 11/26/2020   Procedure: Upper Extremity Angiography;  Surgeon: Celso College, MD;  Location: [name] INVASIVE CV LAB;  Service: Cardiovascular;  Laterality: Right;   UPPER EXTREMITY ANGIOGRAPHY Right 02/05/2021   Procedure: UPPER EXTREMITY ANGIOGRAPHY;  Surgeon: Celso College, MD;  Location: [name] INVASIVE CV LAB;  Service: Cardiovascular;  Laterality: Right;   Family History:   Family History  Problem Relation Age of Onset   CAD Father    Stroke Father    Diabetes Mellitus II Mother    Kidney failure Mother     Schizophrenia Mother    Social History:  reports that he has never smoked. He has never used smokeless tobacco. He reports that he does not drink alcohol  and does not use drugs. Allergies  Allergen Reactions   Promethazine  Diarrhea and Other (See Comments)    Muscle cramps, cramping "all over"   Prior to Admission medications   Medication Sig Start Date End Date Taking? Authorizing Provider  acetaminophen  (TYLENOL ) 325 MG tablet Take 2 tablets (650 mg total) by mouth every 6 (six) hours as needed for mild pain (pain score 1-3) or fever (or Fever >/= 101). 05/26/23   Colin Dawley, MD  Lactobacillus (ACIDOPHILUS) CAPS capsule Take 2 capsules by mouth 3 (three) times daily. 07/08/23 08/07/23  Oral Billings, MD  Amino Acids-Protein Hydrolys (PRO-STAT AWC) LIQD Take 30 mLs by mouth every morning. 06/10/23   [provider]  amiodarone  (PACERONE ) 200 MG tablet Take 1 tablet (200 mg total) by mouth daily. 05/26/23 05/26/24  Colin Dawley, MD  amLODipine  (NORVASC ) 5 MG tablet Take 5 mg by mouth daily. 07/01/23   [provider]  ammonium lactate  (AMLACTIN DAILY) 12 % lotion Apply topically. 02/25/23   [provider]  ascorbic acid  (VITAMIN C ) 500 MG tablet Take 1,000 mg by mouth daily. Patient not taking: Reported on 06/21/2023    [provider]  aspirin  EC 81 MG tablet Take 1 tablet (81 mg total) by mouth daily with breakfast. Swallow whole. Patient not taking: Reported on 06/21/2023 05/26/23   Colin Dawley, MD  atorvastatin  (LIPITOR ) 80 MG tablet Take 1 tablet (80 mg total) by mouth at bedtime. 05/26/23   Colin Dawley, MD  brimonidine  (ALPHAGAN ) 0.2 % ophthalmic solution Place 1 drop into the right eye 3 (three) times daily. Patient taking differently: Place 1 drop into the right eye 2 (two) times daily. 05/30/23   Ninetta Basket, MD  calcitRIOL  (ROCALTROL ) 0.25 MCG capsule Take 9 capsules (2.25 mcg total) by mouth every Tuesday, Thursday, and Saturday at 6  PM. Patient taking differently: Take 0.25 mcg by mouth in the morning, at noon, and at bedtime. Every Tues-Thur-Sat 02/24/23   DanfordWillis Harter, MD  calcium  acetate (PHOSLO ) 667 MG tablet Take 1,334 mg by mouth 3 (three) times daily. 06/18/23   [provider]  carvedilol  (COREG ) 12.5 MG tablet Take 1 tablet (12.5 mg total) by mouth 2 (two) times daily. 05/26/23   Colin Dawley, MD  cinacalcet  (SENSIPAR ) 30 MG tablet Take 2 tablets (  60 mg total) by mouth every Tuesday, Thursday, and Saturday at 6 PM. 02/24/23   Danford, Willis Harter, MD  cloNIDine  (CATAPRES ) 0.3 MG tablet Take 0.3 mg by mouth every 8 (eight) hours. 07/01/23   [provider]  dorzolamide -timolol  (COSOPT ) 2-0.5 % ophthalmic solution Place 1 drop into the right eye 2 (two) times daily. 11/15/22 11/15/23  [provider]  hydrALAZINE  (APRESOLINE ) 25 MG tablet Take 25 mg by mouth 3 (three) times daily. 07/01/23   [provider]  levETIRAcetam  (KEPPRA ) 500 MG tablet Take 1 tablet (500 mg total) by mouth 4 (four) times a week. On Sunday, Monday, Wednesday and Friday, all non-dialysis days 05/26/23   Colin Dawley, MD  levETIRAcetam  (KEPPRA ) 750 MG tablet Take 1 tablet (750 mg total) by mouth 3 (three) times a week. On Tuesday Thursday and Saturday, on dialysis days 05/27/23   Colin Dawley, MD  lidocaine -prilocaine  (EMLA ) cream Apply 1 Application topically 3 (three) times a week. 12/24/21   [provider]  loperamide  (IMODIUM ) 2 MG capsule Take 1 capsule (2 mg total) by mouth as needed for diarrhea or loose stools. 02/23/23   Danford, Willis Harter, MD  multivitamin (RENA-VIT) TABS tablet Take 1 tablet by mouth at bedtime. Patient not taking: Reported on 06/16/2023 04/12/23   Rai, Hurman Maiden, MD  Nutritional Supplements (,FEEDING SUPPLEMENT, PROSOURCE PLUS) liquid Take 30 mLs by mouth 3 (three) times daily between meals. 02/23/23   Danford, Willis Harter, MD  ondansetron  (ZOFRAN ) 4 MG tablet Take 1  tablet (4 mg total) by mouth every 6 (six) hours as needed for nausea or vomiting. 03/17/23   Adel Aden, PA-C  oxyCODONE  (OXY IR/ROXICODONE ) 5 MG immediate release tablet Take 1 tablet (5 mg total) by mouth every 4 (four) hours as needed for moderate pain (pain score 4-6). 07/08/23   Oral Billings, MD  pentoxifylline  (TRENTAL ) 400 MG CR tablet Take 400 mg by mouth 3 (three) times daily. 07/01/23   [provider]  pregabalin  (LYRICA ) 50 MG capsule Take 1 capsule (50 mg total) by mouth daily. 07/09/23   Oral Billings, MD  sodium zirconium cyclosilicate  (LOKELMA ) 10 g PACK packet Take 10 g by mouth 3 (three) times daily. 07/08/23 08/07/23  Oral Billings, MD  Zinc  220 (50 Zn) MG CAPS Take 1 capsule by mouth every morning. 06/18/23   [provider]  Zinc  Oxide (DESITIN MAXIMUM STRENGTH) 40 % PSTE Apply 1 Application topically 2 (two) times daily as needed. 06/16/23   Teddi Favors, DO   Current Facility-Administered Medications  Medication Dose Route Frequency Provider Last Rate Last Admin   (feeding supplement) PROSource Plus liquid 30 mL  30 mL Oral TID BM Arrien, Mauricio Daniel, MD       acetaminophen  (TYLENOL ) tablet 650 mg  650 mg Oral Q6H PRN Arrien, Curlee Doss, MD       Or   acetaminophen  (TYLENOL ) suppository 650 mg  650 mg Rectal Q6H PRN Arrien, Mauricio Daniel, MD       amiodarone  (PACERONE ) tablet 200 mg  200 mg Oral Daily Arrien, Curlee Doss, MD   200 mg at 07/14/23 7829   amLODipine  (NORVASC ) tablet 5 mg  5 mg Oral Daily Arrien, Curlee Doss, MD   5 mg at 07/14/23 5621   atorvastatin  (LIPITOR ) tablet 80 mg  80 mg Oral QHS Arrien, Mauricio Daniel, MD   80 mg at 07/14/23 0100   brimonidine  (ALPHAGAN ) 0.2 % ophthalmic solution 1 drop  1 drop Right  Eye BID Arrien, Curlee Doss, MD   1 drop at 07/14/23 1610   calcitRIOL  (ROCALTROL ) capsule 2.25 mcg  2.25 mcg Oral Q T,Th,Sat-1800 Arrien, Mauricio Daniel, MD       calcium  acetate (PHOSLO ) capsule 1,334  mg  1,334 mg Oral TID WC Arrien, Mauricio Daniel, MD   1,334 mg at 07/14/23 9604   carvedilol  (COREG ) tablet 12.5 mg  12.5 mg Oral BID Arrien, Mauricio Daniel, MD   12.5 mg at 07/14/23 5409   cinacalcet  (SENSIPAR ) tablet 60 mg  60 mg Oral Q T,Th,Sat-1800 Arrien, Curlee Doss, MD       cloNIDine  (CATAPRES ) tablet 0.3 mg  0.3 mg Oral Q8H Arrien, Curlee Doss, MD   0.3 mg at 07/14/23 8119   dorzolamide -timolol  (COSOPT ) 2-0.5 % ophthalmic solution 1 drop  1 drop Right Eye BID Arrien, Mauricio Daniel, MD   1 drop at 07/14/23 0845   heparin  injection 5,000 Units  5,000 Units Subcutaneous Q8H Arrien, Mauricio Daniel, MD   5,000 Units at 07/14/23 1478   hydrALAZINE  (APRESOLINE ) tablet 25 mg  25 mg Oral TID Arrien, Mauricio Daniel, MD   25 mg at 07/14/23 2956   insulin  aspart (novoLOG ) injection 0-6 Units  0-6 Units Subcutaneous TID WC Arrien, Mauricio Daniel, MD       levETIRAcetam  (KEPPRA ) tablet 500 mg  500 mg Oral Once per day on Sunday Tuesday Thursday Saturday Arrien, Curlee Doss, MD       Cecily Cohen ON 07/15/2023] levETIRAcetam  (KEPPRA ) tablet 750 mg  750 mg Oral Once per day on Monday Wednesday Friday Arrien, Curlee Doss, MD       Cecily Cohen ON 07/15/2023] lidocaine -prilocaine  (EMLA ) cream 1 Application  1 Application Topical Once per day on Monday Wednesday Friday Arrien, Curlee Doss, MD       ondansetron  (ZOFRAN ) tablet 4 mg  4 mg Oral Q6H PRN Arrien, Curlee Doss, MD       Or   ondansetron  (ZOFRAN ) injection 4 mg  4 mg Intravenous Q6H PRN Arrien, Curlee Doss, MD       oxyCODONE  (Oxy IR/ROXICODONE ) immediate release tablet 5 mg  5 mg Oral Q4H PRN Arrien, Curlee Doss, MD       pentoxifylline  (TRENTAL ) CR tablet 400 mg  400 mg Oral TID Arrien, Mauricio Daniel, MD   400 mg at 07/14/23 2130   pregabalin  (LYRICA ) capsule 50 mg  50 mg Oral Daily Arrien, Curlee Doss, MD       [facility] LIQD 30 mL  30 mL Oral q morning Arrien, Mauricio Daniel, MD       sodium zirconium  cyclosilicate (LOKELMA ) packet 10 g  10 g Oral TID Arrien, Mauricio Daniel, MD       zinc  sulfate (50mg  elemental zinc ) capsule 220 mg  220 mg Oral q morning Arrien, Curlee Doss, MD   220 mg at 07/14/23 0843   Labs: Basic Metabolic Panel: Recent Labs  Lab 07/08/23 0637 07/13/23 1842 07/14/23 0436  NA 136 140 145  K 4.3 5.6* 5.7*  CL 98 106 106  CO2 25 21* 20*  GLUCOSE 77 87 85  BUN 68* 146* 147*  CREATININE 5.97* 11.56* 11.95*  CALCIUM  8.9 9.2 9.5   Liver Function Tests: Recent Labs  Lab 07/08/23 0637 07/13/23 1842  AST 19 17  ALT 37 30  ALKPHOS 73 69  BILITOT 0.8 0.7  PROT 6.0* 6.4*  ALBUMIN  2.3* 2.3*   No results for input(s): "LIPASE", "AMYLASE" in the last 168 hours. No results for input(s): "  AMMONIA" in the last 168 hours. CBC: Recent Labs  Lab 07/08/23 0637 07/13/23 1842 07/14/23 0436  WBC 9.0 11.0* 11.5*  NEUTROABS  --  7.3  --   HGB 8.1* 8.0* 8.1*  HCT 26.8* 27.5* 26.9*  MCV 76.4* 77.7* 77.1*  PLT 216 282 281   Cardiac Enzymes: No results for input(s): "CKTOTAL", "CKMB", "CKMBINDEX", "TROPONINI" in the last 168 hours. CBG: Recent Labs  Lab 07/14/23 0802  GLUCAP 100*   Iron Studies: No results for input(s): "IRON", "TIBC", "TRANSFERRIN", "FERRITIN" in the last 72 hours. Studies/Results: No results found.  ROS: Pertinent items are noted in HPI. Physical Exam: Vitals:   07/14/23 0403 07/14/23 0413 07/14/23 0604 07/14/23 0842  BP:  (!) 106/58 127/70 124/67  Pulse:  61  60  Resp:  18    Temp:  98.1 F (36.7 C)    TempSrc:      SpO2:  92%    Weight: 68.8 kg     Height: 3' 8.88" (1.14 m)         Weight change:  No intake or output data in the 24 hours ending 07/14/23 0850 BP 124/67   Pulse 60   Temp 98.1 F (36.7 C)   Resp 18   Ht 3' 8.88" (1.14 m) Comment: change in height because of double amputee  Wt 68.8 kg   SpO2 92%   BMI 52.94 kg/m  General appearance: no distress and uncooperative Head: Normocephalic, without obvious  abnormality, atraumatic Resp: clear to auscultation bilaterally Cardio: regular rate and rhythm, S1, S2 normal, no murmur, click, rub or gallop GI: soft, non-tender; bowel sounds normal; no masses,  no organomegaly Extremities: s/p bilateral AKA's, no edema, LUE AVF +T/B Dialysis Access:  Dialysis Orders: [facility] TTS 3 hr  edw 77kg,  LUE AVF   Heparin  1000 bolus + 400u/hr Mircera 100 mcg  q 2wks due  4/28 ( was no show)  No vitamin D    Assessment/Plan: Suicidal ideation - per primary svc and psych.  Denies active suicidal ideation but reports that he doesn't want to go to dialysis. Infected wound L foot: hx of R AKA for similar issues. --- S/p L AKA on 5/2  ESRD: on HD TTS.  Plan for HD today with low K bath HTN/Vol: Stable BPs. Stable. No volume on exam. Anemia:  S/p 1 unit PRBCs 5/6. Transfuse 07/06/23 1 U PRBC.  Will resume ESA Secondary hyperparathyroidism: CorrCa elevated -not on Vit D. Phos above goal on Ca acetate binder --follow trends.  Nutrition: optimize as able. Prot supp per RD.  Hyperkalemia: K 7.1 today. Lokelma  10 mg TID continued for this patient.  Seizure d/o - on keppra  DM2 - per pmd Diarrhea - +C. Diff and norovirus on admit. - s/p po vanc  FTT - poor po intake, sacral wound, was discharged to SNF opn 07/08/23   Benjamin Brands, MD [facility], [facility] Pager [phone] 07/14/2023, 8:50 AM

## 2023-07-14 NOTE — Plan of Care (Signed)
   Problem: Education: Goal: Knowledge of General Education information will improve Description: Including pain rating scale, medication(s)/side effects and non-pharmacologic comfort measures Outcome: Not Progressing   Problem: Health Behavior/Discharge Planning: Goal: Ability to manage health-related needs will improve Outcome: Not Progressing   Problem: Clinical Measurements: Goal: Ability to maintain clinical measurements within normal limits will improve Outcome: Not Progressing   Problem: Activity: Goal: Risk for activity intolerance will decrease Outcome: Not Progressing

## 2023-07-15 DIAGNOSIS — I10 Essential (primary) hypertension: Secondary | ICD-10-CM | POA: Diagnosis not present

## 2023-07-15 DIAGNOSIS — I480 Paroxysmal atrial fibrillation: Secondary | ICD-10-CM | POA: Diagnosis not present

## 2023-07-15 DIAGNOSIS — N186 End stage renal disease: Secondary | ICD-10-CM | POA: Diagnosis not present

## 2023-07-15 DIAGNOSIS — I252 Old myocardial infarction: Secondary | ICD-10-CM | POA: Diagnosis not present

## 2023-07-15 LAB — BASIC METABOLIC PANEL WITH GFR
Anion gap: 12 (ref 5–15)
BUN: 98 mg/dL — ABNORMAL HIGH (ref 6–20)
CO2: 25 mmol/L (ref 22–32)
Calcium: 9.1 mg/dL (ref 8.9–10.3)
Chloride: 99 mmol/L (ref 98–111)
Creatinine, Ser: 8.49 mg/dL — ABNORMAL HIGH (ref 0.61–1.24)
GFR, Estimated: 7 mL/min — ABNORMAL LOW (ref >60)
Glucose, Bld: 120 mg/dL — ABNORMAL HIGH (ref 70–99)
Potassium: 4.6 mmol/L (ref 3.5–5.1)
Sodium: 136 mmol/L (ref 135–145)

## 2023-07-15 LAB — GLUCOSE, CAPILLARY
Glucose-Capillary: 125 mg/dL — ABNORMAL HIGH (ref 70–99)
Glucose-Capillary: 142 mg/dL — ABNORMAL HIGH (ref 70–99)
Glucose-Capillary: 140 mg/dL — ABNORMAL HIGH (ref 70–99)
Glucose-Capillary: 158 mg/dL — ABNORMAL HIGH (ref 70–99)

## 2023-07-15 LAB — HEPATITIS B SURFACE ANTIBODY, QUANTITATIVE: Hep B S AB Quant (Post): 24.8 m[IU]/mL

## 2023-07-15 MED ORDER — SODIUM ZIRCONIUM CYCLOSILICATE 10 G PO PACK: 10.0000 g | PACK | Freq: Every day | ORAL | 0 refills | Status: AC

## 2023-07-15 MED ORDER — BRIMONIDINE TARTRATE 0.2 % OP SOLN: 1.0000 [drp] | Freq: Two times a day (BID) | OPHTHALMIC | Status: DC

## 2023-07-15 MED ORDER — SERTRALINE HCL 25 MG PO TABS: 25.0000 mg | ORAL_TABLET | Freq: Every day | ORAL | 1 refills | Status: DC

## 2023-07-15 MED ORDER — OXYCODONE HCL 5 MG PO TABS: 5.0000 mg | ORAL_TABLET | Freq: Three times a day (TID) | ORAL | 0 refills | Status: DC | PRN

## 2023-07-15 NOTE — Progress Notes (Signed)
 Patient ID: [name]., male   DOB: 1970-04-04, 53 y.o.   MRN: [id number] [name] assigned RN at the [facility] reached out to [facility] regarding reassessing pt by psychiatry. Earlier today, Research officer, political party had not deemed a reevaluation necessary as pt was in the process of being discharged from the hospital as per documentation from the [facility] team's CSW S. Annabell Key)., when recommendations for inpatient hospitalization were initially made. This afternoon, we have been in communication with pt's primary treatment team including Dr. Michaelene Admire & pt's assigned RN. Dr Michaelene Admire has indicated that some adjustments in medications have been made including the addition of zoloft to medication regimen based on his depressive symptoms. He also indicated that pt is currently denying SI, does not remember making suicidal statements, and is in the process of being transferred to a skilled nursing facility. It is recommended for pt to continue to be followed by behavioral health while at the skilled nursing facility. We are therefore clearing pt from our service (the [facility] behavioral health team), with recommendations for mental health follow up outside of the hospital setting.

## 2023-07-15 NOTE — Discharge Summary (Signed)
 Physician Discharge Summary   Patient: [name]. MRN: [id number] DOB: 1970/07/18  Admit date:     07/13/2023  Discharge date: 07/15/23  Discharge Physician: Justina Oman   PCP: Ayesha Lente, FNP   Recommendations at discharge:  Further goals of care and advance care planning discussion recommended. Repeat basic metabolic panel to follow electrolytes stability. Repeat CBC to follow hemoglobin trend. Reassess patient's mood stability and response to antidepressant agents for further adjustment to the dosages and addition of further medications. Outpatient follow-up with psychiatry service recommended.  Discharge Diagnoses: Principal Problem:   ESRD on hemodialysis [facility]) Active Problems:   CAD/ H/o prior STEMI 02/2020 s/p DES to LAD   Essential hypertension, benign   Paroxysmal atrial fibrillation (HCC) - not on systemic anticoagulation due to ICH while on Eliquis  in 11-2021.   Type 2 diabetes mellitus with hyperlipidemia (HCC)   History of CVA (cerebrovascular accident) - with ICH while on plavix  and Eliquis  in 11-2021.   History of seizure   Moderate protein-calorie malnutrition (HCC)   Depression  Brief hospital admission narrative course: As per H&P written by Dr. Sunnie England on 07/13/2023 [name]. is a 53 y.o. male with medical history significant of ESRD on HD, (T TH Sat), bilateral AKA, T2DM,  coronary artery diease, heart failure, paroxysmal atrial fibrillation, and seizures who presented from the SNF due to suicidal thoughts.  Recent hospitalization 04/28 to 07/08/23 for left lower extremity wound infection that required AKA.  He was discharged to SNF.  Today he expressed to nursing home staff that he was willing to end his life. EMS was called and patient was transported to the ED.  Apparently he did not complete his HD session yesterday due to buttock pain.    In the emergency department he denies being suicidal and not willing to harm him self or  others.  At the time of my examination he is only answering to simple questions, denies any pain or dyspnea.   Assessment and Plan: ESRD on hemodialysis (HCC)/hyperkalemia -Patient without signs of fluid overload on exam - After receiving Lokelma  and hemodialysis on 07/14/2023 patient's potassium 4.6 potassium  - Will continue daily use of Lokelma  - Continue to follow electrolytes - Resume prior to admission dialysis services. - Continue patient follow-up with nephrology service.   Anemia of chronic renal disease,  - No overt bleeding appreciated - Hemoglobin stable - Continue to follow hemoglobin trend/stability - IV iron and Epogen  therapy as per nephrology discretion.     Metabolic bone disease,  -continue with phoslo , cinacalcet  and calcitriol .  -Continue outpatient follow-up with nephrology service.      CAD/ H/o prior STEMI 02/2020 s/p DES to LAD -No shortness of breath and no chest pain reported - Telemetry without ischemic change - Will continue treatment with aspirin  and statin    Essential hypertension, benign -Continue current antihypertensive regimen.   Paroxysmal atrial fibrillation (HCC) - not on systemic anticoagulation due to ICH while on Eliquis  in 11-2021. -Continue amiodarone  for rate control and continue daily aspirin  - No chronic anticoagulation secondary to prior history of ICH.   Type 2 diabetes mellitus with hyperlipidemia (HCC) -Follow CBG fluctuation and further adjust hypoglycemic regimen - Sliding scale insulin  has been ordered - Continue statin.   History of CVA (cerebrovascular accident) - with ICH while on plavix  and Eliquis  in 11-2021. -Continue aspirin  for secondary prevention - Continue statin, blood pressure control and risk factor modification - No neurodeficits.   History of seizure -Continue  Keppra    Moderate protein-calorie malnutrition (HCC) -Continue feeding supplements. - And maintain adequate hydration.   Depression/presumed  suicidal thoughts at time of admission -Patient denies any suicidal thoughts - Psychiatry service has been consulted and recommendations for outpatient follow-up has been received. - Sertraline 25 mg daily has been started - Continue supportive care.  Consultants: Nephrology service, psychiatry service. Procedures performed: Reports. Disposition: Home with home health services. Diet recommendation: Heart healthy/low-sodium diet.  DISCHARGE MEDICATION: Allergies as of 07/15/2023       Reactions   Promethazine  Diarrhea, Other (See Comments)   Muscle cramps, cramping "all over"        Medication List     STOP taking these medications    Acidophilus Caps capsule   loperamide  2 MG capsule Commonly known as: IMODIUM    ondansetron  4 MG tablet Commonly known as: ZOFRAN    Zinc  220 (50 Zn) MG Caps       TAKE these medications    (feeding supplement) PROSource Plus liquid Take 30 mLs by mouth 3 (three) times daily between meals.   acetaminophen  325 MG tablet Commonly known as: TYLENOL  Take 2 tablets (650 mg total) by mouth every 6 (six) hours as needed for mild pain (pain score 1-3) or fever (or Fever >/= 101).   amiodarone  200 MG tablet Commonly known as: PACERONE  Take 1 tablet (200 mg total) by mouth daily.   Amlactin Daily 12 % lotion Generic drug: ammonium lactate  Apply topically.   amLODipine  5 MG tablet Commonly known as: NORVASC  Take 5 mg by mouth daily.   ascorbic acid  500 MG tablet Commonly known as: VITAMIN C  Take 1,000 mg by mouth daily.   aspirin  EC 81 MG tablet Take 1 tablet (81 mg total) by mouth daily with breakfast. Swallow whole.   atorvastatin  80 MG tablet Commonly known as: LIPITOR  Take 1 tablet (80 mg total) by mouth at bedtime.   brimonidine  0.2 % ophthalmic solution Commonly known as: ALPHAGAN  Place 1 drop into the right eye 2 (two) times daily.   calcitRIOL  0.25 MCG capsule Commonly known as: ROCALTROL  Take 9 capsules (2.25 mcg  total) by mouth every Tuesday, Thursday, and Saturday at 6 PM. What changed:  how much to take when to take this additional instructions   calcium  acetate 667 MG tablet Commonly known as: PHOSLO  Take 1,334 mg by mouth 3 (three) times daily.   carvedilol  12.5 MG tablet Commonly known as: COREG  Take 1 tablet (12.5 mg total) by mouth 2 (two) times daily.   cinacalcet  30 MG tablet Commonly known as: SENSIPAR  Take 2 tablets (60 mg total) by mouth every Tuesday, Thursday, and Saturday at 6 PM.   cloNIDine  0.3 MG tablet Commonly known as: CATAPRES  Take 0.3 mg by mouth every 8 (eight) hours.   Desitin Maximum Strength 40 % Pste Generic drug: Zinc  Oxide Apply 1 Application topically 2 (two) times daily as needed.   dorzolamide -timolol  2-0.5 % ophthalmic solution Commonly known as: COSOPT  Place 1 drop into the right eye 2 (two) times daily.   hydrALAZINE  25 MG tablet Commonly known as: APRESOLINE  Take 25 mg by mouth 3 (three) times daily.   levETIRAcetam  500 MG tablet Commonly known as: KEPPRA  Take 1 tablet (500 mg total) by mouth 4 (four) times a week. On Sunday, Monday, Wednesday and Friday, all non-dialysis days   levETIRAcetam  750 MG tablet Commonly known as: KEPPRA  Take 1 tablet (750 mg total) by mouth 3 (three) times a week. On Tuesday Thursday and Saturday, on dialysis days  lidocaine -prilocaine  cream Commonly known as: EMLA  Apply 1 Application topically 3 (three) times a week.   multivitamin Tabs tablet Take 1 tablet by mouth at bedtime.   oxyCODONE  5 MG immediate release tablet Commonly known as: Oxy IR/ROXICODONE  Take 1 tablet (5 mg total) by mouth every 8 (eight) hours as needed for severe pain (pain score 7-10). What changed:  when to take this reasons to take this   pentoxifylline  400 MG CR tablet Commonly known as: TRENTAL  Take 400 mg by mouth 3 (three) times daily.   pregabalin  50 MG capsule Commonly known as: LYRICA  Take 1 capsule (50 mg total) by  mouth daily.   Pro-Stat AWC Liqd Take 30 mLs by mouth every morning.   sertraline 25 MG tablet Commonly known as: Zoloft Take 1 tablet (25 mg total) by mouth daily.   sodium zirconium cyclosilicate  10 g Pack packet Commonly known as: LOKELMA  Take 10 g by mouth daily for 10 days. What changed: when to take this        Follow-up Information     Ayesha Lente, FNP. Schedule an appointment as soon as possible for a visit in 10 day(s).   Specialty: Nurse Practitioner Contact information: [address] [place] [address] [phone]                Discharge Exam: Cleavon Curls Weights   07/14/23 0403 07/14/23 1525 07/14/23 1821  Weight: 68.8 kg 68.8 kg 67 kg   General exam: Patient was more cooperative on today's exam and denied any suicidal thoughts.  In no major distress. Respiratory system: Clear to auscultation. Respiratory effort normal.  Good saturation on room air. Cardiovascular system: Rate controlled, no rubs, no gallops, no JVD. Gastrointestinal system: Abdomen is nondistended, soft and nontender. No organomegaly or masses felt. Normal bowel sounds heard. Central nervous system:  No focal neurological deficits. Extremities: No edema; bilateral AKA appreciated. Skin: No petechiae. Psychiatry: Flat affect on exam.    Condition at discharge: Stable and improved.  The results of significant diagnostics from this hospitalization (including imaging, microbiology, ancillary and laboratory) are listed below for reference.   Imaging Studies: DG CHEST PORT 1 VIEW Result Date: 06/25/2023 CLINICAL DATA:  Fever EXAM: PORTABLE CHEST 1 VIEW COMPARISON:  06/16/2023 FINDINGS: Stable right basilar parenchymal scarring. Persistent right basilar consolidation and small right pleural effusion, possibly representing a parapneumonic effusion. Left lung is clear. No pneumothorax. No pleural effusion on the left. Cardiac size is within normal limits. No acute bone abnormality.  IMPRESSION: 1. Persistent right basilar consolidation and small right pleural effusion, possibly representing a parapneumonic effusion. Electronically Signed   By: Worthy Heads M.D.   On: 06/25/2023 11:46   DG Foot Complete Left Result Date: 06/20/2023 CLINICAL DATA:  Wound check. Abscess. Wound to the bottom of the left foot. EXAM: LEFT FOOT - COMPLETE 3+ VIEW COMPARISON:  MRI left foot 04/04/2023. Left foot radiographs 05/25/2022 FINDINGS: Previous amputation of the left first toe at the metatarsal-phalangeal level. Postoperative changes versus resorption at the articular surface of the second metatarsal-phalangeal joint. Similar appearance to previous study. Diffuse bone demineralization. Degenerative changes in the interphalangeal and intertarsal joints. No evidence of acute fracture or dislocation. No focal bone lesion or new bone erosion. Extensive vascular calcifications. No radiopaque soft tissue foreign bodies or soft tissue gas identified. IMPRESSION: 1. Chronic bone changes as discussed above. No acute displaced fractures identified. No acute bone erosion suggested. 2. Extensive soft tissue calcification. No radiopaque foreign bodies or soft  tissue gas. Electronically Signed   By: Boyce Byes M.D.   On: 06/20/2023 18:31   CT Head Wo Contrast Result Date: 06/16/2023 CLINICAL DATA:  Seizure disorder, clinical change EXAM: CT HEAD WITHOUT CONTRAST TECHNIQUE: Contiguous axial images were obtained from the base of the skull through the vertex without intravenous contrast. RADIATION DOSE REDUCTION: This exam was performed according to the departmental dose-optimization program which includes automated exposure control, adjustment of the mA and/or kV according to patient size and/or use of iterative reconstruction technique. COMPARISON:  05/14/2023 FINDINGS: Brain: No evidence of acute infarction, hemorrhage, hydrocephalus, extra-axial collection or mass lesion/mass effect. Infarcts along the  superior frontal convexities are unchanged. Extensive low-density in the cerebral white matter attributed to chronic small vessel disease with chronic lacunar infarcts at the bilateral thalamus. Cerebral volume loss with ventriculomegaly. Vascular: No hyperdense vessel. Confluent calcification of medium and small vessels. Skull: No acute finding Sinuses/Orbits: Left enucleation with prosthesis. Partial bilateral mastoid opacification with normal nasopharynx. IMPRESSION: Premature atrophy with advanced chronic ischemia. No acute or interval finding. Electronically Signed   By: Ronnette Coke M.D.   On: 06/16/2023 10:24   DG Chest Portable 1 View Result Date: 06/16/2023 CLINICAL DATA:  Altered mental status EXAM: PORTABLE CHEST 1 VIEW COMPARISON:  May 22, 2023 FINDINGS: Subtle residual right basilar infiltrates and atelectasis similar to prior examination may represent chronic changes with right pleural reaction. No acute cardiopulmonary infiltrates Heart and mediastinum normal IMPRESSION: Subtle residual right basilar infiltrates and atelectasis similar to prior examination may represent chronic changes with right pleural reaction. Electronically Signed   By: Fredrich Jefferson M.D.   On: 06/16/2023 10:10    Microbiology: Results for orders placed or performed during the hospital encounter of 06/20/23  Gastrointestinal Panel by PCR , Stool     Status: Abnormal   Collection Time: 06/21/23  3:56 AM   Specimen: Stool  Result Value Ref Range Status   Campylobacter species NOT DETECTED NOT DETECTED Final   Plesimonas shigelloides NOT DETECTED NOT DETECTED Final   Salmonella species NOT DETECTED NOT DETECTED Final   Yersinia enterocolitica NOT DETECTED NOT DETECTED Final   Vibrio species NOT DETECTED NOT DETECTED Final   Vibrio cholerae NOT DETECTED NOT DETECTED Final   Enteroaggregative E coli (EAEC) NOT DETECTED NOT DETECTED Final   Enteropathogenic E coli (EPEC) NOT DETECTED NOT DETECTED Final    Enterotoxigenic E coli (ETEC) NOT DETECTED NOT DETECTED Final   Shiga like toxin producing E coli (STEC) NOT DETECTED NOT DETECTED Final   Shigella/Enteroinvasive E coli (EIEC) NOT DETECTED NOT DETECTED Final   Cryptosporidium NOT DETECTED NOT DETECTED Final   Cyclospora cayetanensis NOT DETECTED NOT DETECTED Final   Entamoeba histolytica NOT DETECTED NOT DETECTED Final   Giardia lamblia NOT DETECTED NOT DETECTED Final   Adenovirus F40/41 NOT DETECTED NOT DETECTED Final   Astrovirus NOT DETECTED NOT DETECTED Final   Norovirus GI/GII DETECTED (A) NOT DETECTED Final    Comment: RESULT CALLED TO, READ BACK BY AND VERIFIED WITH: TRAVIS ROSS RN @0229  06/22/23 ASW    Rotavirus A NOT DETECTED NOT DETECTED Final   Sapovirus (I, II, IV, and V) NOT DETECTED NOT DETECTED Final    Comment: Performed at [facility], [address] [place], [place] [address]  C Difficile Quick Screen w PCR reflex     Status: Abnormal   Collection Time: 06/21/23  3:56 AM   Specimen: STOOL  Result Value Ref Range Status   C Diff antigen POSITIVE (  A) NEGATIVE Final   C Diff toxin NEGATIVE NEGATIVE Final   C Diff interpretation Results are indeterminate. See PCR results.  Final    Comment: Performed at [facility], [address], [place], [place] [address]  C. Diff by PCR, Reflexed     Status: Abnormal   Collection Time: 06/21/23  3:56 AM  Result Value Ref Range Status   Toxigenic C. Difficile by PCR POSITIVE (A) NEGATIVE Final    Comment: Positive for toxigenic C. difficile with little to no toxin production. Only treat if clinical presentation suggests symptomatic illness. Performed at [facility], [address] [place], [place] [address]   Surgical pcr screen     Status: None   Collection Time: 06/24/23 12:03 PM   Specimen: Nasal Mucosa; Nasal Swab  Result Value Ref Range Status   MRSA, PCR NEGATIVE NEGATIVE Final   Staphylococcus aureus NEGATIVE NEGATIVE Final    Comment: (NOTE) The Xpert  SA Assay (FDA approved for NASAL specimens in patients 53 years of age and older), is one component of a comprehensive surveillance program. It is not intended to diagnose infection nor to guide or monitor treatment. Performed at [facility], [address], [place], [place] [address]   Culture, blood (Routine X 2) w Reflex to ID Panel     Status: None   Collection Time: 06/25/23  9:10 AM   Specimen: BLOOD RIGHT ARM  Result Value Ref Range Status   Specimen Description BLOOD RIGHT ARM  Final   Special Requests   Final    BOTTLES DRAWN AEROBIC AND ANAEROBIC Blood Culture adequate volume   Culture   Final    NO GROWTH 5 DAYS Performed at [facility], [address] [place], [place] [address]    Report Status 06/30/2023 FINAL  Final  Culture, blood (Routine X 2) w Reflex to ID Panel     Status: None   Collection Time: 06/25/23  9:11 AM   Specimen: BLOOD RIGHT ARM  Result Value Ref Range Status   Specimen Description BLOOD RIGHT ARM  Final   Special Requests   Final    BOTTLES DRAWN AEROBIC AND ANAEROBIC Blood Culture adequate volume   Culture   Final    NO GROWTH 5 DAYS Performed at [facility], [address] [place], [place] [address]    Report Status 06/30/2023 FINAL  Final    Labs: CBC: Recent Labs  Lab 07/13/23 1842 07/14/23 0436  WBC 11.0* 11.5*  NEUTROABS 7.3  --   HGB 8.0* 8.1*  HCT 27.5* 26.9*  MCV 77.7* 77.1*  PLT 282 281   Basic Metabolic Panel: Recent Labs  Lab 07/13/23 1842 07/14/23 0436 07/15/23 0502  NA 140 145 136  K 5.6* 5.7* 4.6  CL 106 106 99  CO2 21* 20* 25  GLUCOSE 87 85 120*  BUN 146* 147* 98*  CREATININE 11.56* 11.95* 8.49*  CALCIUM  9.2 9.5 9.1   Liver Function Tests: Recent Labs  Lab 07/13/23 1842  AST 17  ALT 30  ALKPHOS 69  BILITOT 0.7  PROT 6.4*  ALBUMIN  2.3*   CBG: Recent Labs  Lab 07/14/23 1822 07/14/23 2046 07/15/23 1005 07/15/23 1114 07/15/23 1626  GLUCAP 119* 163* 125* 142* 140*     Discharge time spent: greater than 30 minutes.  Signed: Justina Oman, MD [facility] 07/15/2023

## 2023-07-15 NOTE — Plan of Care (Signed)
  Problem: Coping: Goal: Level of anxiety will decrease Outcome: Progressing   Problem: Elimination: Goal: Will not experience complications related to bowel motility Outcome: Progressing   Problem: Pain Managment: Goal: General experience of comfort will improve and/or be controlled Outcome: Progressing   Problem: Safety: Goal: Ability to remain free from injury will improve Outcome: Progressing   Problem: Education: Goal: Ability to describe self-care measures that may prevent or decrease complications (Diabetes Survival Skills Education) will improve Outcome: Progressing   Problem: Metabolic: Goal: Ability to maintain appropriate glucose levels will improve Outcome: Progressing

## 2023-07-15 NOTE — TOC Transition Note (Signed)
 Transition of Care [facility]) - Discharge Note   Patient Details  Name: [name]. MRN: [id number] Date of Birth: 05/16/1970  [facility] CM/SW Contact:  Juanda Noon Phone Number: 07/15/2023, 2:58 PM   Clinical Narrative:     Patient discharging today. Spouse updated and agreeable since SNF could not accept back due to no secondary insurance. Spouse is working on transportation and will follow up with nurse. Nurse and MD made aware. Spouse did mentioned that she would call [facility] since she had there information for alternative services. Writer did  call Randel Buss with [facility] and he said that they never had him and can't take him with his psych issues. TOC signing off.    Final next level of care: Home/Self Care Barriers to Discharge: Barriers Resolved   Patient Goals and CMS Choice Patient states their goals for this hospitalization and ongoing recovery are:: DC home CMS Medicare.gov Compare Post Acute Care list provided to::  (Spoke with spouse)        Discharge Placement                Patient to be transferred to facility by: Spouse will arrange or follow up with nurse Name of family member notified: [name] / spouse Patient and family notified of of transfer: 07/15/23  Discharge Plan and Services Additional resources added to the After Visit Summary for   In-house Referral: Clinical Social Work Discharge Planning Services: CM Consult Post Acute Care Choice: Skilled Nursing Facility          DME Arranged: N/A DME Agency: NA         HH Agency: NA        Social Drivers of Health (SDOH) Interventions SDOH Screenings   Food Insecurity: Patient Declined (07/14/2023)  Housing: Patient Declined (07/14/2023)  Recent Concern: Housing - High Risk (05/22/2023)  Transportation Needs: Patient Declined (07/14/2023)  Utilities: Patient Declined (07/14/2023)  Financial Resource Strain: High Risk (07/05/2017)  Physical Activity: Inactive (07/05/2017)   Social Connections: Patient Declined (07/14/2023)  Stress: Stress Concern Present (07/05/2017)  Tobacco Use: Low Risk  (07/14/2023)  Health Literacy: Low Risk  (06/01/2020)   [facility] from [facility]     Readmission Risk Interventions    06/25/2023    9:22 AM 06/21/2023   10:51 AM 05/17/2023    3:44 PM  Readmission Risk Prevention Plan  Transportation Screening Complete Complete Complete  Medication Review (RN Care Manager) Complete Complete Referral to Pharmacy  PCP or Specialist appointment within 3-5 days of discharge Complete  Complete  HRI or Home Care Consult Complete Complete Complete  SW Recovery Care/Counseling Consult Complete Complete Complete  Palliative Care Screening Not Applicable Not Applicable Not Applicable  Skilled Nursing Facility Complete Not Applicable Not Applicable

## 2023-07-15 NOTE — Plan of Care (Signed)
  Problem: Acute Rehab PT Goals(only PT should resolve) Goal: Pt will Roll Supine to Side Outcome: Progressing Flowsheets (Taken 07/15/2023 1411) Pt will Roll Supine to Side: with mod assist Goal: Pt Will Go Supine/Side To Sit Outcome: Progressing Flowsheets (Taken 07/15/2023 1411) Pt will go Supine/Side to Sit: with moderate assist Goal: Pt Will Go Sit To Supine/Side Outcome: Progressing Flowsheets (Taken 07/15/2023 1411) Pt will go Sit to Supine/Side: with moderate assist Goal: Patient Will Perform Sitting Balance Outcome: Progressing Flowsheets (Taken 07/15/2023 1411) Patient will perform sitting balance: with moderate assist Goal: Pt Will Transfer Bed To Chair/Chair To Bed Outcome: Progressing Flowsheets (Taken 07/15/2023 1411) Pt will Transfer Bed to Chair/Chair to Bed: with mod assist   2:12 PM, 07/15/23 Walton Guppy, MPT Physical Therapist with [facility] 336 [phone] office [phone] mobile phone

## 2023-07-15 NOTE — Progress Notes (Signed)
 Contacted MD for order clarification. Last psych said that this patient is high risk for SI but his orders are for low risk with the tele sitter and the room is not SI appropriate. Pt refused to answer SI questions and just repeatedly said "that he is having thoughts."  Refused CBG. MD notified.

## 2023-07-15 NOTE — Evaluation (Signed)
 Physical Therapy Evaluation Patient Details Name: [name]. MRN: [id number] DOB: [birth year] Today's Date: 07/15/2023  History of Present Illness  [name] Donaciano Range. is a 53 y.o. male with medical history significant of ESRD on HD, (T TH Sat), bilateral AKA, T2DM,  coronary artery diease, heart failure, paroxysmal atrial fibrillation, and seizures who presented from the SNF due to suicidal thoughts.   Recent hospitalization 04/28 to 07/08/23 for left lower extremity wound infection that required AKA.   He was discharged to SNF.   Today he expressed to nursing home staff that he was willing to end his life. EMS was called and patient was transported to the ED.   Apparently he did not complete his HD session yesterday due to buttock pain.   Clinical Impression  Patient presents lethargic, but able to follow commands with increased time, required [facility] fully raised for long sitting in bed, unable to dangle legs due to weakness and put back to bed and rolled to the left side to take pressure off buttocks. Patient will benefit from continued skilled physical therapy in hospital and recommended venue below to increase strength, balance, endurance for safe ADLs and gait.         If plan is discharge home, recommend the following: A lot of help with bathing/dressing/bathroom;A lot of help with walking and/or transfers;Help with stairs or ramp for entrance;Assistance with cooking/housework   Can travel by private vehicle   No    Equipment Recommendations None recommended by PT  Recommendations for Other Services       Functional Status Assessment Patient has had a recent decline in their functional status and demonstrates the ability to make significant improvements in function in a reasonable and predictable amount of time.     Precautions / Restrictions Precautions Precautions: Fall Recall of Precautions/Restrictions: Intact Precaution/Restrictions Comments: bilateral  AKA Restrictions Weight Bearing Restrictions Per Provider Order: Yes RLE Weight Bearing Per Provider Order: Non weight bearing LLE Weight Bearing Per Provider Order: Non weight bearing      Mobility  Bed Mobility Overal bed mobility: Needs Assistance Bed Mobility: Supine to Sit, Rolling Rolling: Max assist   Supine to sit: Max assist, HOB elevated     General bed mobility comments: required HOB fully raided for long sitting in bed, unable to maintain sitting balance due to posterior leaning    Transfers                        Ambulation/Gait                  Stairs            Wheelchair Mobility     Tilt Bed    Modified Rankin (Stroke Patients Only)       Balance Overall balance assessment: Needs assistance Sitting-balance support: Feet supported, Feet unsupported, Bilateral upper extremity supported Sitting balance-Leahy Scale: Poor Sitting balance - Comments: long sitting in bed                                     Pertinent Vitals/Pain Pain Assessment Pain Assessment: Faces Faces Pain Scale: Hurts little more Pain Location: BUE, BLE with pressure, sacral wound Pain Descriptors / Indicators: Discomfort, Grimacing, Guarding Pain Intervention(s): Limited activity within patient's tolerance, Monitored during session, Repositioned    Home Living Family/patient expects to be discharged to:: Private residence Living Arrangements: Spouse/significant  other Available Help at Discharge: Family;Available PRN/intermittently Type of Home: House         Home Layout: One level Home Equipment: Rollator (4 wheels);Rolling Walker (2 wheels);BSC/3in1;Hospital bed;Other (comment);Wheelchair - Engineer, technical sales - power Additional Comments: Pt lives with wife who works during the day.    Prior Function Prior Level of Function : Needs assist       Physical Assist : Mobility (physical);ADLs (physical) Mobility (physical): Bed  mobility;Transfers;Gait;Stairs ADLs (physical): Bathing;Dressing;Toileting;IADLs Mobility Comments: Bed bound mostly, assisted transfers scooting backwads into chair last admission ADLs Comments: Assisted by family, nursing staff     Extremity/Trunk Assessment   Upper Extremity Assessment Upper Extremity Assessment: Generalized weakness    Lower Extremity Assessment Lower Extremity Assessment: Generalized weakness       Communication   Communication Communication: No apparent difficulties    Cognition Arousal: Lethargic     PT - Cognitive impairments: No apparent impairments                         Following commands: Intact Following commands impaired: Follows one step commands with increased time     Cueing Cueing Techniques: Verbal cues, Tactile cues     General Comments      Exercises     Assessment/Plan    PT Assessment Patient needs continued PT services  PT Problem List Decreased strength;Decreased activity tolerance;Decreased balance;Decreased mobility;Decreased knowledge of use of DME;Decreased cognition;Decreased safety awareness;Decreased knowledge of precautions;Pain;Decreased skin integrity       PT Treatment Interventions DME instruction;Functional mobility training;Therapeutic activities;Therapeutic exercise;Balance training;Patient/family education;Wheelchair mobility training    PT Goals (Current goals can be found in the Care Plan section)  Acute Rehab PT Goals Patient Stated Goal: return home PT Goal Formulation: With patient Time For Goal Achievement: 07/29/23 Potential to Achieve Goals: Fair    Frequency Min 2X/week     Co-evaluation               AM-PAC PT "6 Clicks" Mobility  Outcome Measure Help needed turning from your back to your side while in a flat bed without using bedrails?: A Lot Help needed moving from lying on your back to sitting on the side of a flat bed without using bedrails?: A Lot Help needed  moving to and from a bed to a chair (including a wheelchair)?: Total Help needed standing up from a chair using your arms (e.g., wheelchair or bedside chair)?: Total Help needed to walk in hospital room?: Total Help needed climbing 3-5 steps with a railing? : Total 6 Click Score: 8    End of Session   Activity Tolerance: Patient limited by fatigue;Patient limited by lethargy Patient left: in bed;with call bell/phone within reach Nurse Communication: Mobility status PT Visit Diagnosis: Other abnormalities of gait and mobility (R26.89);Muscle weakness (generalized) (M62.81);Adult, failure to thrive (R62.7)    Time: 1610-9604 PT Time Calculation (min) (ACUTE ONLY): 22 min   Charges:   PT Evaluation $PT Eval Moderate Complexity: 1 Mod PT Treatments $Therapeutic Activity: 8-22 mins PT General Charges $$ ACUTE PT VISIT: 1 Visit         2:10 PM, 07/15/23 Walton Guppy, MPT Physical Therapist with [facility] [phone] office [phone] mobile phone

## 2023-07-15 NOTE — Progress Notes (Signed)
 Admit: 07/13/2023 LOS: 0  46M ESRD THS [facility] admitted with suicidal ideation and missing dialysis  Subjective:  HD yesterday ended 45 minutes early with 1.7 L fluid removed Patient somnolent this morning requiring significant stimulation to arouse  05/22 0701 - 05/23 0700 In: 470 [P.O.:470] Out: 1700   Filed Weights   07/14/23 0403 07/14/23 1525 07/14/23 1821  Weight: 68.8 kg 68.8 kg 67 kg    Scheduled Meds:  (feeding supplement) PROSource Plus  30 mL Oral Daily   amiodarone   200 mg Oral Daily   amLODipine   5 mg Oral Daily   atorvastatin   80 mg Oral QHS   brimonidine   1 drop Right Eye BID   calcitRIOL   2.25 mcg Oral Q T,Th,Sat-1800   calcium  acetate  1,334 mg Oral TID WC   carvedilol   12.5 mg Oral BID   Chlorhexidine  Gluconate Cloth  6 each Topical Q0600   cinacalcet   60 mg Oral Q T,Th,Sat-1800   cloNIDine   0.3 mg Oral Q8H   darbepoetin (ARANESP ) injection - DIALYSIS  100 mcg Subcutaneous Q Thu-1800   dorzolamide -timolol   1 drop Right Eye BID   feeding supplement (NEPRO CARB STEADY)  237 mL Oral TID BM   heparin   5,000 Units Subcutaneous Q8H   hydrALAZINE   25 mg Oral TID   insulin  aspart  0-6 Units Subcutaneous TID WC   levETIRAcetam   500 mg Oral Once per day on Sunday Monday Wednesday Friday   levETIRAcetam   750 mg Oral Once per day on Tuesday Thursday Saturday   multivitamin  1 tablet Oral QHS   pentoxifylline   400 mg Oral TID   pregabalin   50 mg Oral Daily   sodium zirconium cyclosilicate   10 g Oral TID   zinc  sulfate (50mg  elemental zinc )  220 mg Oral q morning   Continuous Infusions: PRN Meds:.acetaminophen  **OR** acetaminophen , ondansetron  **OR** ondansetron  (ZOFRAN ) IV, oxyCODONE   Current Labs: reviewed    Physical Exam:  Blood pressure (!) 149/65, pulse (!) 59, temperature 98.5 F (36.9 C), temperature source Axillary, resp. rate 17, height 6\' 3"  (1.905 m), weight 67 kg, SpO2 94%. Chronically ill-appearing male, somnolent but awakens to physical  stimulus NCAT Clear bilaterally No murmur or rub, regular Bilateral AKA's Bruit and thrill present in the left upper arm AV fistula   A ESRD THS [facility] LUE AVF Suicidal ideation per primary and psychiatry Hypertension/volume: Blood pressure is mildly elevated, monitor over the weekend.  Continues on amlodipine , carvedilol , clonidine  Anemia: Hemoglobin 8.0, given Aranesp  5/22.  High ferritin earlier this month limits use of IV iron BMD: Chronic hyper phosphatemia with normal calcium , continue calcium  acetate, calcitriol , Cinacalcet  Hyperkalemia: Resolved with dialysis, on Lokelma , titrate to keep calcium  between 4 and 5.  P Dialysis orders written for tomorrow We will not round on patient directly over the weekend, please call using Amion with any questions or concerns. Medication Issues; Preferred narcotic agents for pain control are hydromorphone , fentanyl , and methadone. Morphine  should not be used.  Baclofen should be avoided Avoid oral sodium phosphate  and magnesium  citrate based laxatives / bowel preps    Fawn Hooks MD 07/15/2023, 11:34 AM  Recent Labs  Lab 07/13/23 1842 07/14/23 0436 07/15/23 0502  NA 140 145 136  K 5.6* 5.7* 4.6  CL 106 106 99  CO2 21* 20* 25  GLUCOSE 87 85 120*  BUN 146* 147* 98*  CREATININE 11.56* 11.95* 8.49*  CALCIUM  9.2 9.5 9.1   Recent Labs  Lab 07/13/23 1842 07/14/23 0436  WBC  11.0* 11.5*  NEUTROABS 7.3  --   HGB 8.0* 8.1*  HCT 27.5* 26.9*  MCV 77.7* 77.1*  PLT 282 281

## 2023-07-16 LAB — MISC LABCORP TEST (SEND OUT): Labcorp test code: 6510

## 2023-07-16 LAB — GLUCOSE, CAPILLARY: Glucose-Capillary: 110 mg/dL — ABNORMAL HIGH (ref 70–99)

## 2023-07-16 NOTE — Progress Notes (Signed)
 Patient left via stretcher by ambulance to home.  All belongings and discharge papers given to wife on previous shift.  Pt stable at time of discharge

## 2023-07-19 ENCOUNTER — Emergency Department (HOSPITAL_COMMUNITY)

## 2023-07-19 ENCOUNTER — Other Ambulatory Visit: Payer: Self-pay

## 2023-07-19 ENCOUNTER — Emergency Department (HOSPITAL_COMMUNITY)
Admission: EM | Admit: 2023-07-19 | Discharge: 2023-07-19 | Disposition: A | Discharge status: Home / Self Care | Attending: Emergency Medicine | Admitting: Emergency Medicine

## 2023-07-19 ENCOUNTER — Encounter (HOSPITAL_COMMUNITY): Payer: Self-pay | Admitting: Emergency Medicine

## 2023-07-19 DIAGNOSIS — Z7982 Long term (current) use of aspirin: Secondary | ICD-10-CM | POA: Insufficient documentation

## 2023-07-19 DIAGNOSIS — R0602 Shortness of breath: Secondary | ICD-10-CM | POA: Insufficient documentation

## 2023-07-19 DIAGNOSIS — Z79899 Other long term (current) drug therapy: Secondary | ICD-10-CM | POA: Insufficient documentation

## 2023-07-19 DIAGNOSIS — N186 End stage renal disease: Secondary | ICD-10-CM | POA: Insufficient documentation

## 2023-07-19 DIAGNOSIS — Z992 Dependence on renal dialysis: Secondary | ICD-10-CM | POA: Insufficient documentation

## 2023-07-19 LAB — CBC WITH DIFFERENTIAL/PLATELET
Abs Immature Granulocytes: 0.09 10*3/uL — ABNORMAL HIGH (ref 0.00–0.07)
Basophils Absolute: 0.1 10*3/uL (ref 0.0–0.1)
Basophils Relative: 1 %
Eosinophils Absolute: 1.8 10*3/uL — ABNORMAL HIGH (ref 0.0–0.5)
Eosinophils Relative: 12 %
HCT: 28.1 % — ABNORMAL LOW (ref 39.0–52.0)
Hemoglobin: 8.7 g/dL — ABNORMAL LOW (ref 13.0–17.0)
Immature Granulocytes: 1 %
Lymphocytes Relative: 12 %
Lymphs Abs: 1.8 10*3/uL (ref 0.7–4.0)
MCH: 23.3 pg — ABNORMAL LOW (ref 26.0–34.0)
MCHC: 31 g/dL (ref 30.0–36.0)
MCV: 75.1 fL — ABNORMAL LOW (ref 80.0–100.0)
Monocytes Absolute: 1.1 10*3/uL — ABNORMAL HIGH (ref 0.1–1.0)
Monocytes Relative: 7 %
Neutro Abs: 9.6 10*3/uL — ABNORMAL HIGH (ref 1.7–7.7)
Neutrophils Relative %: 67 %
Platelets: 243 10*3/uL (ref 150–400)
RBC: 3.74 MIL/uL — ABNORMAL LOW (ref 4.22–5.81)
RDW: 23.8 % — ABNORMAL HIGH (ref 11.5–15.5)
Smear Review: ADEQUATE
WBC: 14.4 10*3/uL — ABNORMAL HIGH (ref 4.0–10.5)
nRBC: 0 % (ref 0.0–0.2)

## 2023-07-19 LAB — BASIC METABOLIC PANEL WITH GFR
Anion gap: 19 — ABNORMAL HIGH (ref 5–15)
BUN: 143 mg/dL — ABNORMAL HIGH (ref 6–20)
CO2: 18 mmol/L — ABNORMAL LOW (ref 22–32)
Calcium: 8.4 mg/dL — ABNORMAL LOW (ref 8.9–10.3)
Chloride: 99 mmol/L (ref 98–111)
Creatinine, Ser: 12.66 mg/dL — ABNORMAL HIGH (ref 0.61–1.24)
GFR, Estimated: 4 mL/min — ABNORMAL LOW (ref >60)
Glucose, Bld: 93 mg/dL (ref 70–99)
Potassium: 5.2 mmol/L — ABNORMAL HIGH (ref 3.5–5.1)
Sodium: 136 mmol/L (ref 135–145)

## 2023-07-19 NOTE — ED Provider Notes (Signed)
 [place] EMERGENCY DEPARTMENT AT [facility]  Provider Note  CSN: [id number] Arrival date & time: 07/19/23 4098  History Chief Complaint  Patient presents with   Shortness of Breath    [name] Jaishawn Witzke. is a 53 y.o. male with history of ESRD on HD TTS recent admitted for SOB and had dialysis on 5/22, discharged 5/23 but unable to arrange transport to dialysis on 5/24. Wife reports he has been complaining of increased SOB the last few hours, unable to get comfortable in bed. He is due for dialysis in a few hours.    Home Medications Prior to Admission medications   Medication Sig Start Date End Date Taking? Authorizing Provider  acetaminophen  (TYLENOL ) 325 MG tablet Take 2 tablets (650 mg total) by mouth every 6 (six) hours as needed for mild pain (pain score 1-3) or fever (or Fever >/= 101). 05/26/23   Colin Dawley, MD  Amino Acids-Protein Hydrolys (PRO-STAT AWC) LIQD Take 30 mLs by mouth every morning. 06/10/23   [provider]  amiodarone  (PACERONE ) 200 MG tablet Take 1 tablet (200 mg total) by mouth daily. 05/26/23 05/26/24  Colin Dawley, MD  amLODipine  (NORVASC ) 5 MG tablet Take 5 mg by mouth daily. 07/01/23   [provider]  ammonium lactate  (AMLACTIN DAILY) 12 % lotion Apply topically. 02/25/23   [provider]  ascorbic acid  (VITAMIN C ) 500 MG tablet Take 1,000 mg by mouth daily.    [provider]  aspirin  EC 81 MG tablet Take 1 tablet (81 mg total) by mouth daily with breakfast. Swallow whole. 05/26/23   Colin Dawley, MD  atorvastatin  (LIPITOR ) 80 MG tablet Take 1 tablet (80 mg total) by mouth at bedtime. 05/26/23   Colin Dawley, MD  brimonidine  (ALPHAGAN ) 0.2 % ophthalmic solution Place 1 drop into the right eye 2 (two) times daily. 07/15/23   Justina Oman, MD  calcitRIOL  (ROCALTROL ) 0.25 MCG capsule Take 9 capsules (2.25 mcg total) by mouth every Tuesday, Thursday, and Saturday at 6 PM. Patient taking differently: Take  0.25 mcg by mouth in the morning, at noon, and at bedtime. Every Tues-Thur-Sat 02/24/23   DanfordWillis Harter, MD  calcium  acetate (PHOSLO ) 667 MG tablet Take 1,334 mg by mouth 3 (three) times daily. 06/18/23   [provider]  carvedilol  (COREG ) 12.5 MG tablet Take 1 tablet (12.5 mg total) by mouth 2 (two) times daily. 05/26/23   Colin Dawley, MD  cinacalcet  (SENSIPAR ) 30 MG tablet Take 2 tablets (60 mg total) by mouth every Tuesday, Thursday, and Saturday at 6 PM. 02/24/23   Danford, Willis Harter, MD  cloNIDine  (CATAPRES ) 0.3 MG tablet Take 0.3 mg by mouth every 8 (eight) hours. 07/01/23   [provider]  dorzolamide -timolol  (COSOPT ) 2-0.5 % ophthalmic solution Place 1 drop into the right eye 2 (two) times daily. 11/15/22 11/15/23  [provider]  hydrALAZINE  (APRESOLINE ) 25 MG tablet Take 25 mg by mouth 3 (three) times daily. 07/01/23   [provider]  levETIRAcetam  (KEPPRA ) 500 MG tablet Take 1 tablet (500 mg total) by mouth 4 (four) times a week. On Sunday, Monday, Wednesday and Friday, all non-dialysis days 05/26/23   Colin Dawley, MD  levETIRAcetam  (KEPPRA ) 750 MG tablet Take 1 tablet (750 mg total) by mouth 3 (three) times a week. On Tuesday Thursday and Saturday, on dialysis days 05/27/23   Colin Dawley, MD  lidocaine -prilocaine  (EMLA ) cream Apply 1 Application topically 3 (three) times a week. 12/24/21   [provider]  multivitamin (RENA-VIT) TABS tablet Take 1 tablet by mouth at bedtime. 04/12/23   Rai, Hurman Maiden, MD  Nutritional Supplements (,FEEDING SUPPLEMENT, PROSOURCE PLUS) liquid Take 30 mLs by mouth 3 (three) times daily between meals. 02/23/23   Danford, Willis Harter, MD  oxyCODONE  (OXY IR/ROXICODONE ) 5 MG immediate release tablet Take 1 tablet (5 mg total) by mouth every 8 (eight) hours as needed for severe pain (pain score 7-10). 07/15/23   Justina Oman, MD  pentoxifylline  (TRENTAL ) 400 MG CR tablet Take 400 mg by mouth 3 (three) times  daily. 07/01/23   [provider]  pregabalin  (LYRICA ) 50 MG capsule Take 1 capsule (50 mg total) by mouth daily. 07/09/23   Oral Billings, MD  sertraline (ZOLOFT) 25 MG tablet Take 1 tablet (25 mg total) by mouth daily. 07/15/23 07/14/24  Justina Oman, MD  sodium zirconium cyclosilicate  (LOKELMA ) 10 g PACK packet Take 10 g by mouth daily for 10 days. 07/15/23 07/25/23  Justina Oman, MD  Zinc  Oxide (DESITIN MAXIMUM STRENGTH) 40 % PSTE Apply 1 Application topically 2 (two) times daily as needed. 06/16/23   Teddi Favors, DO     Allergies    Promethazine    Review of Systems   Review of Systems Please see HPI for pertinent positives and negatives  Physical Exam BP 124/66 (BP Location: Right Arm)   Pulse (!) 56   Temp 98.3 F (36.8 C) (Oral)   Resp 18   Ht 6\' 3"  (1.905 m)   Wt 67 kg   SpO2 97%   BMI 18.46 kg/m   Physical Exam Vitals and nursing note reviewed.  Constitutional:      Appearance: Normal appearance.  HENT:     Head: Normocephalic and atraumatic.     Nose: Nose normal.     Mouth/Throat:     Mouth: Mucous membranes are moist.  Eyes:     Extraocular Movements: Extraocular movements intact.     Conjunctiva/sclera: Conjunctivae normal.  Cardiovascular:     Rate and Rhythm: Normal rate.     Comments: JVD Pulmonary:     Effort: Pulmonary effort is normal.     Breath sounds: Normal breath sounds.  Abdominal:     General: Abdomen is flat.     Palpations: Abdomen is soft.     Tenderness: There is no abdominal tenderness.  Musculoskeletal:        General: No swelling.     Cervical back: Neck supple.     Comments: S/p BLE AKA  Skin:    General: Skin is warm and dry.  Neurological:     General: No focal deficit present.     Mental Status: He is alert.  Psychiatric:        Mood and Affect: Mood normal.     ED Results / Procedures / Treatments   EKG EKG Interpretation Date/Time:  Tuesday Jul 19 2023 03:43:19 EDT Ventricular Rate:  57 PR  Interval:  271 QRS Duration:  113 QT Interval:  471 QTC Calculation: 459 R Axis:   80  Text Interpretation: Sinus rhythm Prolonged PR interval Anteroseptal infarct, old Borderline T abnormalities, inferior leads No significant change since last tracing Confirmed by Shawnee Dellen [phone]) on 07/19/2023 3:55:26 AM  Procedures Procedures  Medications Ordered in the ED Medications - No data to display  Initial Impression and Plan  Patient here with SOB in setting of missed dialysis. He had been at Rehab until his recent admission but had apparently used all of his days  and was discharged back to home when he left the hospital. Wife reports she was unable to get him a ride to dialysis on Saturday and due to his BKA she was not able to transport him herself. He is scheduled for dialysis later this morning. No reported fever or chest pains. Will check labs and CXR.   ED Course   Clinical Course as of 07/19/23 0513  Tue Jul 19, 2023  0406 I personally viewed the images from radiology studies and agree with radiologist interpretation: CXR shows cardiomegaly, no significant edema  [CS]  0413 CBC with mildly increased WBC from prior. Anemia is chronic.  [CS]  0458 BMP consistent with ESRD, K not significantly elevated. Will plan discharge in time to go to his usual outpatient dialysis appointment in an hour.  [CS]    Clinical Course User Index [CS] Charmayne Cooper, MD     MDM Rules/Calculators/A&P Medical Decision Making Problems Addressed: ESRD on hemodialysis [facility]): acute illness or injury  Amount and/or Complexity of Data Reviewed Labs: ordered. Decision-making details documented in ED Course. Radiology: ordered and independent interpretation performed. Decision-making details documented in ED Course. ECG/medicine tests: ordered and independent interpretation performed. Decision-making details documented in ED Course.     Final Clinical Impression(s) / ED Diagnoses Final  diagnoses:  ESRD on hemodialysis [facility])    Rx / DC Orders ED Discharge Orders     None        Charmayne Cooper, MD 07/19/23 [phone]

## 2023-07-19 NOTE — ED Triage Notes (Signed)
 Pt here for c/o difficulty breathing since yesterday. Pt receives HD on Tues,Thur, Sat and wife states that he missed his dialysis on Sat because they were unable to get transportation there.

## 2023-07-21 ENCOUNTER — Emergency Department (HOSPITAL_COMMUNITY)

## 2023-07-21 ENCOUNTER — Other Ambulatory Visit: Payer: Self-pay

## 2023-07-21 ENCOUNTER — Encounter (HOSPITAL_COMMUNITY): Payer: Self-pay | Admitting: Emergency Medicine

## 2023-07-21 ENCOUNTER — Observation Stay (HOSPITAL_COMMUNITY)
Admission: EM | Admit: 2023-07-21 | Discharge: 2023-07-25 | Disposition: A | Discharge status: Home Health | Attending: Family Medicine | Admitting: Family Medicine

## 2023-07-21 DIAGNOSIS — N186 End stage renal disease: Principal | ICD-10-CM

## 2023-07-21 DIAGNOSIS — E785 Hyperlipidemia, unspecified: Secondary | ICD-10-CM | POA: Diagnosis not present

## 2023-07-21 DIAGNOSIS — I5032 Chronic diastolic (congestive) heart failure: Secondary | ICD-10-CM | POA: Diagnosis not present

## 2023-07-21 DIAGNOSIS — I2510 Atherosclerotic heart disease of native coronary artery without angina pectoris: Secondary | ICD-10-CM | POA: Insufficient documentation

## 2023-07-21 DIAGNOSIS — F32A Depression, unspecified: Secondary | ICD-10-CM | POA: Diagnosis not present

## 2023-07-21 DIAGNOSIS — R569 Unspecified convulsions: Secondary | ICD-10-CM | POA: Diagnosis not present

## 2023-07-21 DIAGNOSIS — Z89611 Acquired absence of right leg above knee: Secondary | ICD-10-CM

## 2023-07-21 DIAGNOSIS — I480 Paroxysmal atrial fibrillation: Secondary | ICD-10-CM | POA: Diagnosis present

## 2023-07-21 DIAGNOSIS — E889 Metabolic disorder, unspecified: Secondary | ICD-10-CM | POA: Diagnosis not present

## 2023-07-21 DIAGNOSIS — D631 Anemia in chronic kidney disease: Secondary | ICD-10-CM

## 2023-07-21 DIAGNOSIS — I10 Essential (primary) hypertension: Secondary | ICD-10-CM | POA: Diagnosis present

## 2023-07-21 DIAGNOSIS — I4581 Long QT syndrome: Secondary | ICD-10-CM | POA: Insufficient documentation

## 2023-07-21 DIAGNOSIS — E1122 Type 2 diabetes mellitus with diabetic chronic kidney disease: Secondary | ICD-10-CM

## 2023-07-21 DIAGNOSIS — I132 Hypertensive heart and chronic kidney disease with heart failure and with stage 5 chronic kidney disease, or end stage renal disease: Secondary | ICD-10-CM | POA: Diagnosis not present

## 2023-07-21 DIAGNOSIS — I252 Old myocardial infarction: Secondary | ICD-10-CM

## 2023-07-21 DIAGNOSIS — Z992 Dependence on renal dialysis: Secondary | ICD-10-CM | POA: Diagnosis not present

## 2023-07-21 DIAGNOSIS — E119 Type 2 diabetes mellitus without complications: Secondary | ICD-10-CM

## 2023-07-21 DIAGNOSIS — R531 Weakness: Secondary | ICD-10-CM

## 2023-07-21 DIAGNOSIS — E11649 Type 2 diabetes mellitus with hypoglycemia without coma: Secondary | ICD-10-CM

## 2023-07-21 DIAGNOSIS — Z7982 Long term (current) use of aspirin: Secondary | ICD-10-CM | POA: Diagnosis not present

## 2023-07-21 LAB — BASIC METABOLIC PANEL WITH GFR
Anion gap: 22 — ABNORMAL HIGH (ref 5–15)
BUN: 104 mg/dL — ABNORMAL HIGH (ref 6–20)
CO2: 19 mmol/L — ABNORMAL LOW (ref 22–32)
Calcium: 8.6 mg/dL — ABNORMAL LOW (ref 8.9–10.3)
Chloride: 95 mmol/L — ABNORMAL LOW (ref 98–111)
Creatinine, Ser: 11.27 mg/dL — ABNORMAL HIGH (ref 0.61–1.24)
GFR, Estimated: 5 mL/min — ABNORMAL LOW (ref >60)
Glucose, Bld: 79 mg/dL (ref 70–99)
Potassium: 4.9 mmol/L (ref 3.5–5.1)
Sodium: 136 mmol/L (ref 135–145)

## 2023-07-21 LAB — GLUCOSE, CAPILLARY
Glucose-Capillary: 68 mg/dL — ABNORMAL LOW (ref 70–99)
Glucose-Capillary: 70 mg/dL (ref 70–99)

## 2023-07-21 LAB — CBC
HCT: 30.2 % — ABNORMAL LOW (ref 39.0–52.0)
Hemoglobin: 9.3 g/dL — ABNORMAL LOW (ref 13.0–17.0)
MCH: 23.2 pg — ABNORMAL LOW (ref 26.0–34.0)
MCHC: 30.8 g/dL (ref 30.0–36.0)
MCV: 75.3 fL — ABNORMAL LOW (ref 80.0–100.0)
Platelets: 203 10*3/uL (ref 150–400)
RBC: 4.01 MIL/uL — ABNORMAL LOW (ref 4.22–5.81)
RDW: 22.5 % — ABNORMAL HIGH (ref 11.5–15.5)
WBC: 13.4 10*3/uL — ABNORMAL HIGH (ref 4.0–10.5)
nRBC: 0.2 % (ref 0.0–0.2)

## 2023-07-21 MED ORDER — CHLORHEXIDINE GLUCONATE CLOTH 2 % EX PADS
6.0000 | MEDICATED_PAD | Freq: Every day | CUTANEOUS | Status: DC
  Administered 2023-07-22 – 2023-07-25 (×3): 6 via TOPICAL

## 2023-07-21 MED ORDER — ATORVASTATIN CALCIUM 40 MG PO TABS
80.0000 mg | ORAL_TABLET | Freq: Every day | ORAL | Status: DC
  Administered 2023-07-21 – 2023-07-24 (×4): 80 mg via ORAL
  Filled 2023-07-21 (×4): qty 2

## 2023-07-21 MED ORDER — BRIMONIDINE TARTRATE 0.2 % OP SOLN
1.0000 [drp] | Freq: Two times a day (BID) | OPHTHALMIC | Status: DC
  Administered 2023-07-21 – 2023-07-25 (×8): 1 [drp] via OPHTHALMIC
  Filled 2023-07-21: qty 5

## 2023-07-21 MED ORDER — POLYETHYLENE GLYCOL 3350 17 G PO PACK: 17.0000 g | PACK | Freq: Every day | ORAL | Status: DC | PRN

## 2023-07-21 MED ORDER — ASPIRIN 81 MG PO TBEC
81.0000 mg | DELAYED_RELEASE_TABLET | Freq: Every day | ORAL | Status: DC
  Administered 2023-07-22 – 2023-07-25 (×4): 81 mg via ORAL
  Filled 2023-07-21 (×4): qty 1

## 2023-07-21 MED ORDER — PENTAFLUOROPROP-TETRAFLUOROETH EX AERO: 1.0000 | INHALATION_SPRAY | CUTANEOUS | Status: DC | PRN

## 2023-07-21 MED ORDER — ONDANSETRON HCL 4 MG/2ML IJ SOLN
4.0000 mg | Freq: Once | INTRAMUSCULAR | Status: AC
  Administered 2023-07-21: 4 mg via INTRAVENOUS
  Filled 2023-07-21: qty 2

## 2023-07-21 MED ORDER — LEVETIRACETAM 500 MG PO TABS
750.0000 mg | ORAL_TABLET | ORAL | Status: DC
  Administered 2023-07-23: 750 mg via ORAL
  Filled 2023-07-21: qty 1

## 2023-07-21 MED ORDER — DORZOLAMIDE HCL-TIMOLOL MAL 2-0.5 % OP SOLN
1.0000 [drp] | Freq: Two times a day (BID) | OPHTHALMIC | Status: DC
  Administered 2023-07-21 – 2023-07-25 (×8): 1 [drp] via OPHTHALMIC
  Filled 2023-07-21: qty 10

## 2023-07-21 MED ORDER — INSULIN ASPART 100 UNIT/ML IJ SOLN: 0.0000 [IU] | Freq: Three times a day (TID) | INTRAMUSCULAR | Status: DC

## 2023-07-21 MED ORDER — AMIODARONE HCL 200 MG PO TABS
200.0000 mg | ORAL_TABLET | Freq: Every day | ORAL | Status: DC
  Administered 2023-07-22 – 2023-07-25 (×4): 200 mg via ORAL
  Filled 2023-07-21 (×4): qty 1

## 2023-07-21 MED ORDER — CINACALCET HCL 30 MG PO TABS
60.0000 mg | ORAL_TABLET | ORAL | Status: DC
  Administered 2023-07-23: 60 mg via ORAL
  Filled 2023-07-21: qty 2

## 2023-07-21 MED ORDER — LEVETIRACETAM 500 MG PO TABS
500.0000 mg | ORAL_TABLET | ORAL | Status: DC
  Administered 2023-07-22 – 2023-07-24 (×2): 500 mg via ORAL
  Filled 2023-07-21 (×2): qty 1

## 2023-07-21 MED ORDER — AMLODIPINE BESYLATE 5 MG PO TABS
5.0000 mg | ORAL_TABLET | Freq: Every day | ORAL | Status: DC
  Administered 2023-07-22 – 2023-07-25 (×4): 5 mg via ORAL
  Filled 2023-07-21 (×4): qty 1

## 2023-07-21 MED ORDER — HYDRALAZINE HCL 25 MG PO TABS
25.0000 mg | ORAL_TABLET | Freq: Three times a day (TID) | ORAL | Status: DC
  Administered 2023-07-21 – 2023-07-25 (×11): 25 mg via ORAL
  Filled 2023-07-21 (×11): qty 1

## 2023-07-21 MED ORDER — METOCLOPRAMIDE HCL 5 MG/ML IJ SOLN: 5.0000 mg | Freq: Three times a day (TID) | INTRAMUSCULAR | Status: DC | PRN

## 2023-07-21 MED ORDER — ACETAMINOPHEN 650 MG RE SUPP: 650.0000 mg | Freq: Four times a day (QID) | RECTAL | Status: DC | PRN

## 2023-07-21 MED ORDER — OXYCODONE HCL 5 MG PO TABS
5.0000 mg | ORAL_TABLET | Freq: Three times a day (TID) | ORAL | Status: DC | PRN
  Administered 2023-07-22 – 2023-07-24 (×2): 5 mg via ORAL
  Filled 2023-07-21 (×2): qty 1

## 2023-07-21 MED ORDER — SERTRALINE HCL 50 MG PO TABS
25.0000 mg | ORAL_TABLET | Freq: Every day | ORAL | Status: DC
  Administered 2023-07-22 – 2023-07-25 (×4): 25 mg via ORAL
  Filled 2023-07-21 (×4): qty 1

## 2023-07-21 MED ORDER — CARVEDILOL 12.5 MG PO TABS
12.5000 mg | ORAL_TABLET | Freq: Two times a day (BID) | ORAL | Status: DC
  Administered 2023-07-21 – 2023-07-25 (×8): 12.5 mg via ORAL
  Filled 2023-07-21 (×8): qty 1

## 2023-07-21 MED ORDER — INSULIN ASPART 100 UNIT/ML IJ SOLN: 0.0000 [IU] | Freq: Every day | INTRAMUSCULAR | Status: DC

## 2023-07-21 MED ORDER — HEPARIN SODIUM (PORCINE) 5000 UNIT/ML IJ SOLN
5000.0000 [IU] | Freq: Three times a day (TID) | INTRAMUSCULAR | Status: DC
  Administered 2023-07-21 – 2023-07-25 (×9): 5000 [IU] via SUBCUTANEOUS
  Filled 2023-07-21 (×9): qty 1

## 2023-07-21 MED ORDER — PENTOXIFYLLINE ER 400 MG PO TBCR
400.0000 mg | EXTENDED_RELEASE_TABLET | Freq: Three times a day (TID) | ORAL | Status: DC
  Administered 2023-07-21 – 2023-07-25 (×11): 400 mg via ORAL
  Filled 2023-07-21 (×11): qty 1

## 2023-07-21 MED ORDER — CLONIDINE HCL 0.2 MG PO TABS
0.3000 mg | ORAL_TABLET | Freq: Three times a day (TID) | ORAL | Status: DC
  Administered 2023-07-21 – 2023-07-25 (×10): 0.3 mg via ORAL
  Filled 2023-07-21 (×11): qty 1

## 2023-07-21 MED ORDER — ACETAMINOPHEN 325 MG PO TABS
650.0000 mg | ORAL_TABLET | Freq: Four times a day (QID) | ORAL | Status: DC | PRN
  Administered 2023-07-24 (×2): 650 mg via ORAL
  Filled 2023-07-21 (×2): qty 2

## 2023-07-21 MED ORDER — CALCIUM ACETATE (PHOS BINDER) 667 MG PO TABS
1334.0000 mg | ORAL_TABLET | Freq: Three times a day (TID) | ORAL | Status: DC
  Filled 2023-07-21 (×4): qty 2

## 2023-07-21 NOTE — H&P (Signed)
 History and Physical    George Carla Charon. WGN:[id number] DOB: January 03, 1971 DOA: 07/21/2023  PCP: Ayesha Lente, FNP   Patient coming from: Home  I have personally briefly reviewed patient's old medical records in [facility]  Chief Complaint: Vomiting,  HPI: Jonnie Markevion Lattin. is a 53 y.o. male with medical history significant for ESRD, diastolic CHF, diabetes mellitus, hypertension, coronary artery disease, seizures. Patient was brought to the ED from dialysis center reports of feeling ill, generalized weakness.  Patient did not get dialysis today-per patient because he felt ill and he was uncomfortable.  He reports episodes of vomiting over the past couple of days.  No abdominal pain.  No change in bowel habits.  He denies difficulty breathing, no chest pain. Patient's wife is at bedside and assists with the history.  She reports that patient has hemorrhoids, and so he is uncomfortable sitting down for dialysis- as he has to do at [facility]. He prefers to lie down for dialysis.  His dialysis schedule is Tuesday Thursday Saturday, he did not complete his last HD-Tuesday 5/27.  And he missed the dialysis before that on Saturday 5/24.  His last full dialysis session was likely at least a week ago. Patient's wife also expresses a lot of frustration about taking care of patient.  This is patient's second hospitalization in the past 6 months.  Last hospitalization 5/21 to 5/23, patient stating he wanted to end his life.  He was discharged to nursing home and subsequently discharged back home a week ago. Spouse reports she had to take time off work.  She teaches.  She is unable to transfer him, even with the lift.  She reports falls.  Also she lives home by 7:30 AM till 430 PM, he does not eat the whole time while she is at work-  because of his gastroparesis he will have resultant diarrhea after eating, and have to lie in his stool till she gets back.  ED Course: Temperature 97.7.  Heart rate  50s to 60.  Respiratory rate-??  Accuracy.  Blood pressure 90-188 systolic. WBC 11.8. Chest x-ray clear. Nephrologist Dr. Cindra Cree consulted and had patient dialyzed in the ED.  With persistent weakness, hospitalization requested.  Review of Systems: As per HPI all other systems reviewed and negative.  Past Medical History:  Diagnosis Date   Acute Bil  CVAs of Bil Frontal amd Rt Parietal  in Watershed Distribution 04/21/2021   Patchy acute cortically-based infarcts within the bilateral high  frontal lobes and right parietal lobe (predominantly in a watershed  distribution).   Subcentimeter acute infarct within the callosal splenium on the  left.     Acute bilateral cerebral infarction in a watershed distribution [facility]) 04/21/2021   a.) MRI brain 04/21/2021 --> patchy ACUTE cortically-based infarcts within the bilateral high frontal lobes and right parietal lobe   Acute cerebral infarction (HCC) 02/13/2021   a.) MRI brain 02/13/2021: acute LEFT hippocampal infarct   Acute cerebral infarction (HCC) 04/21/2021   a.) MRI brain 04/21/2021: subcentimeter ACUTE infarct within the callosal splenium on the LEFT   Acute osteomyelitis of toe of right foot with gangrene (HCC) 02/26/2022   Anaphylactic reaction due to adverse effect of correct drug or medicament properly administered, initial encounter 05/08/2019   Anemia of chronic renal failure    Anxiety    Aortic dilatation (HCC) 11/25/2020   a.) TTE 11/25/2020: Ao root measured 38 mm. b.) TTE 04/22/2021: Ao root measured 44 mm; c. 11/2021 Echo: Ao  root 40mm.   Atrial fibrillation (HCC)    a.) CHA2DS2-VASc = 6 (CHF, HTN, CVA x2, prior MI, T2DM). b.) rate/rhythm maintained on oral amiodarone  + carvedilol ; chronic OAC/AP therapy discontinued following ICH   Benign prostatic hyperplasia with lower urinary tract symptoms 11/07/2019   BPH (benign prostatic hyperplasia)    Cardiac arrest (HCC) 07/26/2021   a.) in setting of hyperkalemia, NSTEMI, and  seizure following missed HD; pulseless and apneic --> required 1 round of CPR prior to ROSC   Cellulitis    Cerebral hemorrhage (HCC) 11/21/2021   a.) CT head 11/21/2021 --> 1.4 x 1.5 x 3.1 cm ACUTE hemorrhage within the inferior aspect of the fourth ventricle, and extending inferiorly through the foramen of foramen of [place] --> occurred in setting of HTN emergency and DOAC/APT-->eliquis /plavix  d/c'd.   Cerebral microvascular disease    Chronic diarrhea    Chronic heart failure with preserved ejection fraction (HFpEF) (HCC) 07/19/2017   a.) 06/2017 Echo: EF 75%; b.) 11/2020 Echo: EF 55-60%; c.) 04/2021 Echo: EF 55-60%; d.) 11/2021 Echo: EF 65-70%, GrII DD, nl RV fxn, sev dil LA, large circumferential pericardial eff w/o tamponade, triv MR, AoV sclerosis. Ao root 40mm; e.) TTE 02/28/2025: EF 60-65%, sev LVH, mod posterior LV effusion, AoV sclerosis   Chronic pain of both ankles    Coronary artery disease 02/26/2020   a. 02/2020 Ant STEMI/PCI: LM nl, LAD 48m (3.5x30 Resolute Onyx), RI nl, LCX min irregs, RCA min irregs. EF 65%.   DDD (degenerative disc disease), cervical    Diabetes mellitus, type II (HCC)    Diabetic neuropathy (HCC)    Esophagitis    ESRD (end stage renal disease) on dialysis (HCC)    a.) [facility]; T-Th-Sat   Frequent falls    Gait instability    Gastritis    Gastroparesis    GERD (gastroesophageal reflux disease)    H/O enucleation of left eyeball    Heart palpitations    History of kidney stones    HLD (hyperlipidemia)    Hypertension    Insomnia    a.) uses melatonin PRN   Long term current use of amiodarone     Nausea and vomiting in adult    recurrent   NSTEMI (non-ST elevated myocardial infarction) (HCC) 07/26/2021   a.) hyperkalemic from missed HD; seizures; decompensated to cardiac arrest and required CPR x 1 round prior to ROSC.   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   Added automatically from request for surgery [id number]     Osteomyelitis [facility])    Pericardial effusion    a. 11/2021 Echo: EF 65-70%, GrII DD, nl RV fxn, sev dil LA, large circumferential pericardial eff w/o tamponade, triv MR, AoV sclerosis. Ao root 40mm.   Seizure (HCC)    a.) last 07/26/2021 in setting of missed HD --> hyperkalemic at 6.5 --> pulseless/apneic and required CPR; discharged home on levetiracetam .   ST elevation myocardial infarction (STEMI) of anterior wall (HCC) 02/26/2020   a.) LHC/PCI 02/26/2020 --> EF 65%; LVEDP 11 mmHg; 90% mLAD (3.5 x 20 mm Resolute Onyx DES x 1)    Past Surgical History:  Procedure Laterality Date   A/V FISTULAGRAM Left 08/31/2021   Procedure: A/V Fistulagram;  Surgeon: Celso College, MD;  Location: [name] INVASIVE CV LAB;  Service: Cardiovascular;  Laterality: Left;   A/V FISTULAGRAM Left 01/25/2022   Procedure: A/V Fistulagram;  Surgeon: Celso College, MD;  Location:  [name] INVASIVE CV LAB;  Service: Cardiovascular;  Laterality: Left;   A/V FISTULAGRAM Right 10/18/2022   Procedure: A/V Fistulagram;  Surgeon: Celso College, MD;  Location: [name] INVASIVE CV LAB;  Service: Cardiovascular;  Laterality: Right;   ABDOMINAL AORTOGRAM W/LOWER EXTREMITY Bilateral 02/08/2023   Procedure: ABDOMINAL AORTOGRAM W/LOWER EXTREMITY;  Surgeon: Philipp Brawn, MD;  Location: [facility] INVASIVE CV LAB;  Service: Vascular;  Laterality: Bilateral;   AMPUTATION Left 03/16/2016   Procedure: AMPUTATION DIGIT LEFT HALLUX;  Surgeon: Charity Conch, DPM;  Location: [facility] OR;  Service: Podiatry;  Laterality: Left;  can start around 5    AMPUTATION Right 02/09/2021   Procedure: AMPUTATION DIGIT;  Surgeon: Marilyn Shropshire, MD;  Location: [facility] OR;  Service: Orthopedics;  Laterality: Right;   AMPUTATION Right 02/04/2023   Procedure: AMPUTATION 4TH AND 5TH METATARSAL;  Surgeon: Evertt Hoe, DPM;  Location: [facility] OR;  Service: Orthopedics/Podiatry;  Laterality: Right;  I&D, partial ray  resection, abx beads   AMPUTATION Right 02/09/2023   Procedure: RIGHT BELOW KNEE AMPUTATION;  Surgeon: Timothy Ford, MD;  Location: [facility] OR;  Service: Orthopedics;  Laterality: Right;   AMPUTATION Right 04/06/2023   Procedure: RIGHT ABOVE KNEE AMPUTATION;  Surgeon: Timothy Ford, MD;  Location: [facility] OR;  Service: Orthopedics;  Laterality: Right;   AMPUTATION Left 06/24/2023   Procedure: AMPUTATION, ABOVE KNEE;  Surgeon: Timothy Ford, MD;  Location: [facility] OR;  Service: Orthopedics;  Laterality: Left;   AMPUTATION Right 05/11/2023   Procedure: RIGHT ABOVE KNEE AMPUTATION;  Surgeon: Timothy Ford, MD;  Location: [facility] OR;  Service: Orthopedics;  Laterality: Right;  REVISION RIGHT ABOVE KNEE AMPUTATION   AMPUTATION TOE Right 02/28/2022   Procedure: AMPUTATION TOE;  Surgeon: Jennefer Moats, DPM;  Location: [name] ORS;  Service: Podiatry;  Laterality: Right;  right fifth toe   ARTHROSCOPIC REPAIR ACL Left    AV FISTULA PLACEMENT Right 05/31/2019   Procedure: Brachiocephalic AV fistula creation;  Surgeon: Celso College, MD;  Location: [name] ORS;  Service: Vascular;  Laterality: Right;   AV FISTULA PLACEMENT Left 08/13/2021   Procedure: ARTERIOVENOUS (AV) FISTULA CREATION (BRACHIALCEPHALIC);  Surgeon: Celso College, MD;  Location: [name] ORS;  Service: Vascular;  Laterality: Left;   CHEST TUBE INSERTION Right 05/06/2023   Procedure: CHEST TUBE INSERTION;  Surgeon: [name] Nigh, MD;  Location: [facility] ENDOSCOPY;  Service: Pulmonary;  Laterality: Right;   COLONOSCOPY WITH PROPOFOL  N/A 10/28/2015   Procedure: COLONOSCOPY WITH PROPOFOL ;  Surgeon: Deveron Fly, MD;  Location: [facility] ENDOSCOPY;  Service: Endoscopy;  Laterality: N/A;   COLONOSCOPY WITH PROPOFOL  N/A 10/29/2015   Procedure: COLONOSCOPY WITH PROPOFOL ;  Surgeon: Deveron Fly, MD;  Location: [facility] ENDOSCOPY;  Service: Endoscopy;  Laterality: N/A;   COLONOSCOPY WITH PROPOFOL  N/A 01/27/2023   Procedure: COLONOSCOPY WITH PROPOFOL ;  Surgeon: Hargis Lias, MD;   Location: [facility] ENDO SUITE;  Service: Endoscopy;  Laterality: N/A;  12:30pm, asa 3   CORONARY ULTRASOUND/IVUS N/A 02/26/2020   Procedure: Intravascular Ultrasound/IVUS;  Surgeon: Lucendia Rusk, MD;  Location: [facility] INVASIVE CV LAB;  Service: Cardiovascular;  Laterality: N/A;   CORONARY/GRAFT ACUTE MI REVASCULARIZATION N/A 02/26/2020   Procedure: Coronary/Graft Acute MI Revascularization;  Surgeon: Lucendia Rusk, MD;  Location: [facility] INVASIVE CV LAB;  Service: Cardiovascular;  Laterality: N/A;   DIALYSIS/PERMA CATHETER INSERTION Right 04/26/2019   Perm Cath    DIALYSIS/PERMA CATHETER INSERTION N/A 04/26/2019   Procedure: DIALYSIS/PERMA CATHETER INSERTION;  Surgeon: Celso College, MD;  Location:  [name] INVASIVE CV LAB;  Service: Cardiovascular;  Laterality: N/A;   DIALYSIS/PERMA CATHETER INSERTION N/A 05/25/2021   Procedure: DIALYSIS/PERMA CATHETER INSERTION;  Surgeon: Celso College, MD;  Location: [name] INVASIVE CV LAB;  Service: Cardiovascular;  Laterality: N/A;   DIALYSIS/PERMA CATHETER INSERTION N/A 08/31/2021   Procedure: DIALYSIS/PERMA CATHETER INSERTION;  Surgeon: Celso College, MD;  Location: [name] INVASIVE CV LAB;  Service: Cardiovascular;  Laterality: N/A;   DIALYSIS/PERMA CATHETER REMOVAL N/A 08/15/2019   Procedure: DIALYSIS/PERMA CATHETER REMOVAL;  Surgeon: Jackquelyn Mass, MD;  Location: [name] INVASIVE CV LAB;  Service: Cardiovascular;  Laterality: N/A;   DIALYSIS/PERMA CATHETER REMOVAL N/A 03/15/2022   Procedure: DIALYSIS/PERMA CATHETER REMOVAL;  Surgeon: Celso College, MD;  Location: [name] INVASIVE CV LAB;  Service: Cardiovascular;  Laterality: N/A;   EMBOLIZATION (CATH LAB) Right 02/06/2021   Procedure: EMBOLIZATION;  Surgeon: Celso College, MD;  Location: [name] INVASIVE CV LAB;  Service: Cardiovascular;  Laterality: Right;  Right Upper Extremity Dialysis Access, Permcath Placement   ESOPHAGOGASTRODUODENOSCOPY (EGD) WITH PROPOFOL  N/A 12/27/2017   Procedure: ESOPHAGOGASTRODUODENOSCOPY  (EGD) WITH PROPOFOL ;  Surgeon: Toledo, Alphonsus Jeans, MD;  Location: [name] ENDOSCOPY;  Service: Gastroenterology;  Laterality: N/A;   EYE SURGERY     IR THORACENTESIS ASP PLEURAL SPACE W/IMG GUIDE  05/05/2023   LEFT HEART CATH AND CORONARY ANGIOGRAPHY N/A 02/26/2020   Procedure: LEFT HEART CATH AND CORONARY ANGIOGRAPHY;  Surgeon: Lucendia Rusk, MD;  Location: [facility] INVASIVE CV LAB;  Service: Cardiovascular;  Laterality: N/A;   PROSTATE SURGERY  2016   REVISON OF ARTERIOVENOUS FISTULA Right 07/22/2022   Procedure: RESECTION OF ANEURYSMAL RIGHT ARM ARTERIOVENOUS FISTULA;  Surgeon: Celso College, MD;  Location: [name] ORS;  Service: Vascular;  Laterality: Right;   TONSILECTOMY/ADENOIDECTOMY WITH MYRINGOTOMY     TONSILLECTOMY     UPPER EXTREMITY ANGIOGRAPHY Right 11/26/2020   Procedure: Upper Extremity Angiography;  Surgeon: Celso College, MD;  Location: [name] INVASIVE CV LAB;  Service: Cardiovascular;  Laterality: Right;   UPPER EXTREMITY ANGIOGRAPHY Right 02/05/2021   Procedure: UPPER EXTREMITY ANGIOGRAPHY;  Surgeon: Celso College, MD;  Location: [name] INVASIVE CV LAB;  Service: Cardiovascular;  Laterality: Right;     reports that he has never smoked. He has never used smokeless tobacco. He reports that he does not drink alcohol  and does not use drugs.  Allergies  Allergen Reactions   Promethazine  Diarrhea and Other (See Comments)    Muscle cramps, cramping "all over"    Family History  Problem Relation Age of Onset   CAD Father    Stroke Father    Diabetes Mellitus II Mother    Kidney failure Mother    Schizophrenia Mother     Prior to Admission medications   Medication Sig Start Date End Date Taking? Authorizing Provider  acetaminophen  (TYLENOL ) 325 MG tablet Take 2 tablets (650 mg total) by mouth every 6 (six) hours as needed for mild pain (pain score 1-3) or fever (or Fever >/= 101). 05/26/23  Yes Quantavis Obryant, Courage, MD  amiodarone  (PACERONE ) 200 MG tablet Take 1 tablet (200 mg total) by  mouth daily. 05/26/23 05/26/24 Yes Chance Munter, Courage, MD  amLODipine  (NORVASC ) 5 MG tablet Take 5 mg by mouth daily. 07/01/23  Yes [provider]  ammonium lactate  (AMLACTIN DAILY) 12 % lotion Apply topically. 02/25/23  Yes [provider]  aspirin  EC 81 MG tablet Take 1 tablet (81 mg total) by mouth daily with breakfast. Swallow whole. 05/26/23  Yes Colin Dawley, MD  atorvastatin  (LIPITOR )  80 MG tablet Take 1 tablet (80 mg total) by mouth at bedtime. 05/26/23  Yes Michelle Wnek, Courage, MD  brimonidine  (ALPHAGAN ) 0.2 % ophthalmic solution Place 1 drop into the right eye 2 (two) times daily. 07/15/23  Yes Justina Oman, MD  calcium  acetate (PHOSLO ) 667 MG tablet Take 1,334 mg by mouth 3 (three) times daily. 06/18/23  Yes [provider]  carvedilol  (COREG ) 12.5 MG tablet Take 1 tablet (12.5 mg total) by mouth 2 (two) times daily. 05/26/23  Yes Colin Dawley, MD  cinacalcet  (SENSIPAR ) 30 MG tablet Take 2 tablets (60 mg total) by mouth every Tuesday, Thursday, and Saturday at 6 PM. 02/24/23  Yes Danford, Willis Harter, MD  cloNIDine  (CATAPRES ) 0.3 MG tablet Take 0.3 mg by mouth every 8 (eight) hours. 07/01/23  Yes [provider]  dorzolamide -timolol  (COSOPT ) 2-0.5 % ophthalmic solution Place 1 drop into the right eye 2 (two) times daily. 11/15/22 11/15/23 Yes [provider]  hydrALAZINE  (APRESOLINE ) 25 MG tablet Take 25 mg by mouth 3 (three) times daily. 07/01/23  Yes [provider]  levETIRAcetam  (KEPPRA ) 500 MG tablet Take 1 tablet (500 mg total) by mouth 4 (four) times a week. On Sunday, Monday, Wednesday and Friday, all non-dialysis days 05/26/23  Yes Inaya Gillham, Courage, MD  levETIRAcetam  (KEPPRA ) 750 MG tablet Take 1 tablet (750 mg total) by mouth 3 (three) times a week. On Tuesday Thursday and Saturday, on dialysis days 05/27/23  Yes Toney Lizaola, Courage, MD  lidocaine -prilocaine  (EMLA ) cream Apply 1 Application topically 3 (three) times a week. 12/24/21  Yes [provider]  multivitamin (RENA-VIT) TABS tablet Take 1 tablet by mouth at bedtime. 04/12/23  Yes Rai, Ripudeep K, MD  Nutritional Supplements (,FEEDING SUPPLEMENT, PROSOURCE PLUS) liquid Take 30 mLs by mouth 3 (three) times daily between meals. 02/23/23  Yes Danford, Willis Harter, MD  ondansetron  (ZOFRAN ) 4 MG tablet Take 4 mg by mouth every 8 (eight) hours as needed for nausea or vomiting.   Yes [provider]  oxyCODONE  (OXY IR/ROXICODONE ) 5 MG immediate release tablet Take 1 tablet (5 mg total) by mouth every 8 (eight) hours as needed for severe pain (pain score 7-10). 07/15/23  Yes Justina Oman, MD  pentoxifylline  (TRENTAL ) 400 MG CR tablet Take 400 mg by mouth 3 (three) times daily. 07/01/23  Yes [provider]  sertraline  (ZOLOFT ) 25 MG tablet Take 1 tablet (25 mg total) by mouth daily. 07/15/23 07/14/24 Yes Justina Oman, MD  sodium zirconium cyclosilicate  (LOKELMA ) 10 g PACK packet Take 10 g by mouth daily for 10 days. 07/15/23 07/25/23 Yes Justina Oman, MD  Zinc  50 MG TABS Take 1 tablet by mouth daily.   Yes [provider]  Zinc  Oxide (DESITIN MAXIMUM STRENGTH) 40 % PSTE Apply 1 Application topically 2 (two) times daily as needed. 06/16/23  Yes Russella Courts A, DO  Amino Acids-Protein Hydrolys (PRO-STAT AWC) LIQD Take 30 mLs by mouth every morning. Patient not taking: Reported on 07/21/2023 06/10/23   [provider]  calcitRIOL  (ROCALTROL ) 0.25 MCG capsule Take 9 capsules (2.25 mcg total) by mouth every Tuesday, Thursday, and Saturday at 6 PM. Patient not taking: Reported on 07/21/2023 02/24/23   Ephriam Hashimoto, MD  pregabalin  (LYRICA ) 50 MG capsule Take 1 capsule (50 mg total) by mouth daily. 07/09/23   Oral Billings, MD    Physical Exam: Vitals:   07/21/23 1500 07/21/23 1530 07/21/23 1558 07/21/23 1624  BP: 119/73 (!) 116/59 134/72 (!) 161/71  Pulse: (!) 57 Aaron Aas)  57 (!) 57 (!) 57  Resp: 12 (!) 0 12 16  Temp:   98.2 F (36.8 C)   TempSrc:       SpO2: 99% 100% 100% 97%  Weight:        Constitutional: NAD, calm, comfortable Vitals:   07/21/23 1500 07/21/23 1530 07/21/23 1558 07/21/23 1624  BP: 119/73 (!) 116/59 134/72 (!) 161/71  Pulse: (!) 57 (!) 57 (!) 57 (!) 57  Resp: 12 (!) 0 12 16  Temp:   98.2 F (36.8 C)   TempSrc:      SpO2: 99% 100% 100% 97%  Weight:       Eyes: Enucleation left eye, reduced vision right eye ENMT: Mucous membranes are moist.  Neck: normal, supple, no masses, no thyromegaly Respiratory: . Normal respiratory effort. No accessory muscle use.  Cardiovascular: Regular rate and rhythm, no murmurs / rubs / gallops.  Abdomen: no tenderness, no masses palpated. No hepatosplenomegaly. Bowel sounds positive.  Musculoskeletal: no clubbing / cyanosis. No joint deformity upper and lower extremities.  Bilateral BKAs Skin: Recent left BKA staples still present, skin well opposed, appears to be healing, no sign of infection, no rashes, lesions, ulcers. No induration. 4th finger amputation on the right hand, and amputation of quite a few phalanges bilaterally.  Neurologic: Moving extremities spontaneously.  Speech fluent. Psychiatric: Normal judgment and insight. Alert and oriented x 3. Normal mood.   Labs on Admission: I have personally reviewed following labs and imaging studies  CBC: Recent Labs  Lab 07/19/23 0344 07/21/23 0731  WBC 14.4* 13.4*  NEUTROABS 9.6*  --   HGB 8.7* 9.3*  HCT 28.1* 30.2*  MCV 75.1* 75.3*  PLT 243 203   Basic Metabolic Panel: Recent Labs  Lab 07/15/23 0502 07/19/23 0344 07/21/23 0731  NA 136 136 136  K 4.6 5.2* 4.9  CL 99 99 95*  CO2 25 18* 19*  GLUCOSE 120* 93 79  BUN 98* 143* 104*  CREATININE 8.49* 12.66* 11.27*  CALCIUM  9.1 8.4* 8.6*   CBG: Recent Labs  Lab 07/15/23 1005 07/15/23 1114 07/15/23 1626 07/15/23 2012 07/16/23 0208  GLUCAP 125* 142* 140* 158* 110*   Urine analysis:    Component Value Date/Time   COLORURINE YELLOW (A) 11/07/2017 1855    APPEARANCEUR CLOUDY (A) 11/07/2017 1855   LABSPEC 1.016 11/07/2017 1855   PHURINE 5.0 11/07/2017 1855   GLUCOSEU >=500 (A) 11/07/2017 1855   HGBUR MODERATE (A) 11/07/2017 1855   BILIRUBINUR NEGATIVE 11/07/2017 1855   KETONESUR NEGATIVE 11/07/2017 1855   PROTEINUR >=300 (A) 11/07/2017 1855   NITRITE NEGATIVE 11/07/2017 1855   LEUKOCYTESUR NEGATIVE 11/07/2017 1855    Radiological Exams on Admission: DG Chest Portable 1 View Result Date: 07/21/2023 CLINICAL DATA:  Generalized weakness, skipped dialysis today due to feeling poorly. EXAM: PORTABLE CHEST 1 VIEW COMPARISON:  Portable chest 07/19/2023 at 3:55 a.m. FINDINGS: 7:20 a.m. Stable moderate cardiomegaly. There is a coronary artery stent on the left. The mediastinum is unchanged. There is aortic tortuosity and mild ectasia with atherosclerosis. Linear scarring or atelectasis again is noted in the right base and a small right pleural effusion. The lungs are otherwise clear. No new abnormality. Stable bony thorax. IMPRESSION: 1. No significant change from the previous study. 2. Cardiomegaly. 3. Small right pleural effusion. 4. Aortic atherosclerosis. Electronically Signed   By: Denman Fischer M.D.   On: 07/21/2023 07:40   EKG: Independently reviewed.  Sinus rhythm, rate 57, QTc 496.  No significant change from prior.  Assessment/Plan Principal Problem:   Generalized weakness Active Problems:   Anemia due to chronic kidney disease, on chronic dialysis (HCC)   Chronic Grade 2 diastolic heart failure/EF 55 to 60%   ESRD on hemodialysis [facility])   CAD/ H/o prior STEMI 02/2020 s/p DES to LAD   Essential hypertension, benign   Paroxysmal atrial fibrillation (HCC) - not on systemic anticoagulation due to ICH while on Eliquis  in 11-2021.   Diabetes mellitus, type II (HCC)   Seizures (HCC)   Assessment and Plan: No notes have been filed under this hospital service. Service: Hospitalist  Generalized weakness-secondary to bilateral BKA, multiple  hospitalizations, debility.  Spouse reports falls, unable to transfer patient even with the lift, missed and incomplete dialysis sessions, difficulty transporting patient to and from dialysis.  Spouse expressing a lot of frustration with taking care of patient going forward.  This is patient's 7th hospitalization in 6 months, in addition to 5 ED visits. - TOC consult. - Patient will need placement on a permanent basis  Missed ESRD-schedule Tuesday Thursday Saturday.  Missed 2 of his last dialysis sessions, and the 1 he went for he did not complete.  BUN 104, potassium 4.9.  Blood pressure stable. - [name] consulted nephrology Dr. Cindra Cree, patient was dialyzed in the ED.   Anemia of chronic renal disease, - Hemoglobin stable - Continue to follow hemoglobin trend/stability - IV iron and Epogen  therapy as per nephrology discretion.     Metabolic bone disease,  -continue with phoslo , cinacalcet  and calcitriol .  -Continue outpatient follow-up with nephrology service.      CAD/ H/o prior STEMI 02/2020 s/p DES to LAD Stable. -Continue aspirin  and statin    Essential hypertension, benign - Resume Norvasc , carvedilol , clonidine , hydralazine    Paroxysmal atrial fibrillation (HCC) - not on systemic anticoagulation due to ICH while on Eliquis  in 11-2021. -Continue amiodarone  for rate control and continue daily aspirin    Type 2 diabetes mellitus with hyperlipidemia (HCC) - SSI- S -Medication.   History of CVA (cerebrovascular accident) - with ICH while on plavix  and Eliquis  in 11-2021. -Continue aspirin  for secondary prevention - Continue statin, blood pressure control and risk factor modification   History of seizure -Continue Keppra    Depression/presumed suicidal thoughts-during last hospitalization.  - During last hospitalization psychiatry service was been consulted and recommendations for outpatient follow-up has been received. - Sertraline  25 mg daily has been started  Prolonged QTc 496.   Potassium 4.9.  - Check magnesium   DVT prophylaxis: Heparin  Code Status: DNR.  Spouse has patient's universal DNR form with her. Family Communication: Spouse at bedside Disposition Plan: ~ 2 days Consults called: Nephrology Admission status: obs tele    Author: Pati Bonine, MD 07/21/2023 7:29 PM  For on call review [email or web address].

## 2023-07-21 NOTE — ED Triage Notes (Signed)
 Pt arrives via EMS from dialysis where he did not end up receiving tx because he "feels bad." EMS report pt lives at home. [name] generalized weakness and bilat phantom leg pain, SHOB, lack of appetite, and vomiting. Pt reports last successful dialysis tx on Tuesday.

## 2023-07-21 NOTE — ED Notes (Signed)
 Pt went to dialysis.

## 2023-07-21 NOTE — ED Notes (Signed)
 Spoke with Geneticist, molecular at [facility] [place], she stated if the patient could be there by 11 then they could dialyze the patient today, she did state the patient was refusing dialysis this am prior to them calling EMS for him to be transported to the hospital.

## 2023-07-21 NOTE — Procedures (Signed)
 HD Note:  Some information was entered later than the data was gathered due to patient care needs. The stated time with the data is accurate.  Received patient in bed to unit.  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  Verbal cues given with the treatment  Alert and oriented.   Informed consent signed and in chart.   Access used: Left upper arm fistula Access issues: Venous cannulation resulted in good pulse and ease of moving blood with the syringe.  The machine pressures for the venous line were too high at BFR of 400 and were reduced to 350 with success.  Patient BP dropped below normal limits.  UF was paused and goal reduced.  See flowsheet for details. Patient rested during treatment without complaint of discomfort.  TX duration: 3 hours  Alert, without acute distress.  Total UF removed: 1000 ml  Hand-off given to patient's nurse.   Transported back to the ED  Psalm Schappell L. Alva Jewels, RN [facility].

## 2023-07-21 NOTE — ED Provider Notes (Signed)
 [name] EMERGENCY DEPARTMENT AT [facility] Provider Note   CSN: [id number] Arrival date & time: 07/21/23  1914     History  Chief Complaint  Patient presents with   Weakness   Emesis    [name]. is a 53 y.o. male.   Weakness Associated symptoms: vomiting   Emesis Patient presents feeling bad.  States weakness.  States decreased oral intake.  States phantom limb pain in his legs.  He is a dialysis patient due to be dialyzed today but did not go due to feeling bad.  States last dialyzed 2 days ago but appears to have been missed sessions before that.  States he did not do a full run on Tuesday but does not know why.    Past Medical History:  Diagnosis Date   Acute Bil  CVAs of Bil Frontal amd Rt Parietal  in Watershed Distribution 04/21/2021   Patchy acute cortically-based infarcts within the bilateral high  frontal lobes and right parietal lobe (predominantly in a watershed  distribution).   Subcentimeter acute infarct within the callosal splenium on the  left.     Acute bilateral cerebral infarction in a watershed distribution [facility]) 04/21/2021   a.) MRI brain 04/21/2021 --> patchy ACUTE cortically-based infarcts within the bilateral high frontal lobes and right parietal lobe   Acute cerebral infarction (HCC) 02/13/2021   a.) MRI brain 02/13/2021: acute LEFT hippocampal infarct   Acute cerebral infarction (HCC) 04/21/2021   a.) MRI brain 04/21/2021: subcentimeter ACUTE infarct within the callosal splenium on the LEFT   Acute osteomyelitis of toe of right foot with gangrene (HCC) 02/26/2022   Anaphylactic reaction due to adverse effect of correct drug or medicament properly administered, initial encounter 05/08/2019   Anemia of chronic renal failure    Anxiety    Aortic dilatation (HCC) 11/25/2020   a.) TTE 11/25/2020: Ao root measured 38 mm. b.) TTE 04/22/2021: Ao root measured 44 mm; c. 11/2021 Echo: Ao root 40mm.   Atrial fibrillation (HCC)    a.)  CHA2DS2-VASc = 6 (CHF, HTN, CVA x2, prior MI, T2DM). b.) rate/rhythm maintained on oral amiodarone  + carvedilol ; chronic OAC/AP therapy discontinued following ICH   Benign prostatic hyperplasia with lower urinary tract symptoms 11/07/2019   BPH (benign prostatic hyperplasia)    Cardiac arrest (HCC) 07/26/2021   a.) in setting of hyperkalemia, NSTEMI, and seizure following missed HD; pulseless and apneic --> required 1 round of CPR prior to ROSC   Cellulitis    Cerebral hemorrhage (HCC) 11/21/2021   a.) CT head 11/21/2021 --> 1.4 x 1.5 x 3.1 cm ACUTE hemorrhage within the inferior aspect of the fourth ventricle, and extending inferiorly through the foramen of foramen of [place] --> occurred in setting of HTN emergency and DOAC/APT-->eliquis /plavix  d/c'd.   Cerebral microvascular disease    Chronic diarrhea    Chronic heart failure with preserved ejection fraction (HFpEF) (HCC) 07/19/2017   a.) 06/2017 Echo: EF 75%; b.) 11/2020 Echo: EF 55-60%; c.) 04/2021 Echo: EF 55-60%; d.) 11/2021 Echo: EF 65-70%, GrII DD, nl RV fxn, sev dil LA, large circumferential pericardial eff w/o tamponade, triv MR, AoV sclerosis. Ao root 40mm; e.) TTE 02/28/2025: EF 60-65%, sev LVH, mod posterior LV effusion, AoV sclerosis   Chronic pain of both ankles    Coronary artery disease 02/26/2020   a. 02/2020 Ant STEMI/PCI: LM nl, LAD 39m (3.5x30 Resolute Onyx), RI nl, LCX min irregs, RCA min irregs. EF 65%.   DDD (degenerative disc disease), cervical  Diabetes mellitus, type II (HCC)    Diabetic neuropathy (HCC)    Esophagitis    ESRD (end stage renal disease) on dialysis (HCC)    a.) [facility]; T-Th-Sat   Frequent falls    Gait instability    Gastritis    Gastroparesis    GERD (gastroesophageal reflux disease)    H/O enucleation of left eyeball    Heart palpitations    History of kidney stones    HLD (hyperlipidemia)    Hypertension    Insomnia    a.) uses melatonin PRN   Long term current use of amiodarone      Nausea and vomiting in adult    recurrent   NSTEMI (non-ST elevated myocardial infarction) (HCC) 07/26/2021   a.) hyperkalemic from missed HD; seizures; decompensated to cardiac arrest and required CPR x 1 round prior to ROSC.   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  Added automatically from request for surgery [id number]     Osteomyelitis [facility])    Pericardial effusion    a. 11/2021 Echo: EF 65-70%, GrII DD, nl RV fxn, sev dil LA, large circumferential pericardial eff w/o tamponade, triv MR, AoV sclerosis. Ao root 40mm.   Seizure (HCC)    a.) last 07/26/2021 in setting of missed HD --> hyperkalemic at 6.5 --> pulseless/apneic and required CPR; discharged home on levetiracetam .   ST elevation myocardial infarction (STEMI) of anterior wall (HCC) 02/26/2020   a.) LHC/PCI 02/26/2020 --> EF 65%; LVEDP 11 mmHg; 90% mLAD (3.5 x 20 mm Resolute Onyx DES x 1)    Home Medications Prior to Admission medications   Medication Sig Start Date End Date Taking? Authorizing Provider  acetaminophen  (TYLENOL ) 325 MG tablet Take 2 tablets (650 mg total) by mouth every 6 (six) hours as needed for mild pain (pain score 1-3) or fever (or Fever >/= 101). 05/26/23  Yes Emokpae, Courage, MD  amiodarone  (PACERONE ) 200 MG tablet Take 1 tablet (200 mg total) by mouth daily. 05/26/23 05/26/24 Yes Emokpae, Courage, MD  amLODipine  (NORVASC ) 5 MG tablet Take 5 mg by mouth daily. 07/01/23  Yes [provider]  ammonium lactate  (AMLACTIN DAILY) 12 % lotion Apply topically. 02/25/23  Yes [provider]  aspirin  EC 81 MG tablet Take 1 tablet (81 mg total) by mouth daily with breakfast. Swallow whole. 05/26/23  Yes Emokpae, Courage, MD  atorvastatin  (LIPITOR ) 80 MG tablet Take 1 tablet (80 mg total) by mouth at bedtime. 05/26/23  Yes Emokpae, Courage, MD  brimonidine  (ALPHAGAN ) 0.2 % ophthalmic solution Place 1 drop into the right eye 2  (two) times daily. 07/15/23  Yes Justina Oman, MD  calcium  acetate (PHOSLO ) 667 MG tablet Take 1,334 mg by mouth 3 (three) times daily. 06/18/23  Yes [provider]  carvedilol  (COREG ) 12.5 MG tablet Take 1 tablet (12.5 mg total) by mouth 2 (two) times daily. 05/26/23  Yes Colin Dawley, MD  cinacalcet  (SENSIPAR ) 30 MG tablet Take 2 tablets (60 mg total) by mouth every Tuesday, Thursday, and Saturday at 6 PM. 02/24/23  Yes Danford, Willis Harter, MD  cloNIDine  (CATAPRES ) 0.3 MG tablet Take 0.3 mg by mouth every 8 (eight) hours. 07/01/23  Yes [provider]  dorzolamide -timolol  (COSOPT ) 2-0.5 % ophthalmic solution Place 1 drop into the right eye 2 (two) times daily. 11/15/22 11/15/23 Yes [provider]  hydrALAZINE  (APRESOLINE ) 25 MG tablet Take 25 mg by mouth 3 (three) times daily. 07/01/23  Yes [provider]  levETIRAcetam  (KEPPRA ) 500 MG tablet Take 1 tablet (500 mg total) by mouth 4 (four) times a week. On Sunday, Monday, Wednesday and Friday, all non-dialysis days 05/26/23  Yes Emokpae, Courage, MD  levETIRAcetam  (KEPPRA ) 750 MG tablet Take 1 tablet (750 mg total) by mouth 3 (three) times a week. On Tuesday Thursday and Saturday, on dialysis days 05/27/23  Yes Emokpae, Courage, MD  lidocaine -prilocaine  (EMLA ) cream Apply 1 Application topically 3 (three) times a week. 12/24/21  Yes [provider]  multivitamin (RENA-VIT) TABS tablet Take 1 tablet by mouth at bedtime. 04/12/23  Yes Rai, Ripudeep K, MD  Nutritional Supplements (,FEEDING SUPPLEMENT, PROSOURCE PLUS) liquid Take 30 mLs by mouth 3 (three) times daily between meals. 02/23/23  Yes Danford, Willis Harter, MD  ondansetron  (ZOFRAN ) 4 MG tablet Take 4 mg by mouth every 8 (eight) hours as needed for nausea or vomiting.   Yes [provider]  oxyCODONE  (OXY IR/ROXICODONE ) 5 MG immediate release tablet Take 1 tablet (5 mg total) by mouth every 8 (eight) hours as needed for severe pain (pain score 7-10).  07/15/23  Yes Justina Oman, MD  pentoxifylline  (TRENTAL ) 400 MG CR tablet Take 400 mg by mouth 3 (three) times daily. 07/01/23  Yes [provider]  sertraline  (ZOLOFT ) 25 MG tablet Take 1 tablet (25 mg total) by mouth daily. 07/15/23 07/14/24 Yes Justina Oman, MD  sodium zirconium cyclosilicate  (LOKELMA ) 10 g PACK packet Take 10 g by mouth daily for 10 days. 07/15/23 07/25/23 Yes Justina Oman, MD  Zinc  50 MG TABS Take 1 tablet by mouth daily.   Yes [provider]  Zinc  Oxide (DESITIN MAXIMUM STRENGTH) 40 % PSTE Apply 1 Application topically 2 (two) times daily as needed. 06/16/23  Yes Russella Courts A, DO  Amino Acids-Protein Hydrolys (PRO-STAT AWC) LIQD Take 30 mLs by mouth every morning. Patient not taking: Reported on 07/21/2023 06/10/23   [provider]  calcitRIOL  (ROCALTROL ) 0.25 MCG capsule Take 9 capsules (2.25 mcg total) by mouth every Tuesday, Thursday, and Saturday at 6 PM. Patient not taking: Reported on 07/21/2023 02/24/23   Ephriam Hashimoto, MD  pregabalin  (LYRICA ) 50 MG capsule Take 1 capsule (50 mg total) by mouth daily. 07/09/23   Oral Billings, MD      Allergies    Promethazine     Review of Systems   Review of Systems  Gastrointestinal:  Positive for vomiting.  Neurological:  Positive for weakness.    Physical Exam Updated Vital Signs BP 124/68   Pulse (!) 55   Temp 97.7 F (36.5 C) (Oral)   Resp (!) 0   Wt 72.4 kg   SpO2 98%   BMI 19.95 kg/m  Physical Exam Vitals and nursing note reviewed.  HENT:     Head: Normocephalic.  Cardiovascular:     Rate and Rhythm: Regular rhythm.  Pulmonary:     Breath sounds: No wheezing or rhonchi.  Abdominal:     Tenderness: There is no abdominal tenderness.  Neurological:     Mental Status: He is alert.     ED Results / Procedures / Treatments   Labs (all labs ordered are listed, but only abnormal results are displayed) Labs Reviewed  BASIC METABOLIC PANEL WITH GFR - Abnormal; Notable for  the following components:      Result Value   Chloride 95 (*)    CO2 19 (*)    BUN 104 (*)    Creatinine, Ser 11.27 (*)  Calcium  8.6 (*)    GFR, Estimated 5 (*)    Anion gap 22 (*)    All other components within normal limits  CBC - Abnormal; Notable for the following components:   WBC 13.4 (*)    RBC 4.01 (*)    Hemoglobin 9.3 (*)    HCT 30.2 (*)    MCV 75.3 (*)    MCH 23.2 (*)    RDW 22.5 (*)    All other components within normal limits    EKG EKG Interpretation Date/Time:  Thursday Jul 21 2023 06:46:23 EDT Ventricular Rate:  57 PR Interval:  279 QRS Duration:  115 QT Interval:  509 QTC Calculation: 496 R Axis:   43  Text Interpretation: Sinus or ectopic atrial rhythm Prolonged PR interval Probable left ventricular hypertrophy Anterior ST elevation, probably due to LVH Borderline prolonged QT interval No significant change since last tracing Confirmed by Mozell Arias [phone]) on 07/21/2023 9:16:49 AM  Radiology DG Chest Portable 1 View Result Date: 07/21/2023 CLINICAL DATA:  Generalized weakness, skipped dialysis today due to feeling poorly. EXAM: PORTABLE CHEST 1 VIEW COMPARISON:  Portable chest 07/19/2023 at 3:55 a.m. FINDINGS: 7:20 a.m. Stable moderate cardiomegaly. There is a coronary artery stent on the left. The mediastinum is unchanged. There is aortic tortuosity and mild ectasia with atherosclerosis. Linear scarring or atelectasis again is noted in the right base and a small right pleural effusion. The lungs are otherwise clear. No new abnormality. Stable bony thorax. IMPRESSION: 1. No significant change from the previous study. 2. Cardiomegaly. 3. Small right pleural effusion. 4. Aortic atherosclerosis. Electronically Signed   By: Denman Fischer M.D.   On: 07/21/2023 07:40    Procedures Procedures    Medications Ordered in ED Medications  Chlorhexidine  Gluconate Cloth 2 % PADS 6 each (has no administration in time range)  pentafluoroprop-tetrafluoroeth  (GEBAUERS) aerosol 1 Application (has no administration in time range)  ondansetron  (ZOFRAN ) injection 4 mg (4 mg Intravenous Given 07/21/23 0729)    ED Course/ Medical Decision Making/ A&P                                 Medical Decision Making Amount and/or Complexity of Data Reviewed Labs: ordered. Radiology: ordered.  Risk Prescription drug management.   Patient with nausea vomiting and feeling bad.  Due for dialysis today but was feeling bad so came here.  Differential diagnoses long and includes causes such as gastroenteritis and electrolyte abnormalities.  X-ray to be done.  Will also get basic blood work and give antiemetics.  Blood work does show some abnormalities, however similar to prior.  Does appear to need dialysis.  X-ray reassuring and potassium reassuring however is weak and has had had some vomiting.  Discussed with Dr. Cindra Cree from nephrology and patient can be dialyzed here although we attempted to get to his dialysis center but it cannot be done today.        Final Clinical Impression(s) / ED Diagnoses Final diagnoses:  End stage renal disease on dialysis [facility])    Rx / DC Orders ED Discharge Orders     None         Mozell Arias, MD 07/21/23 [phone]

## 2023-07-21 NOTE — ED Provider Notes (Signed)
  Physical Exam  BP (!) 161/71   Pulse (!) 57   Temp 98.2 F (36.8 C)   Resp 16   Wt 72.4 kg   SpO2 97%   BMI 19.95 kg/m   Physical Exam  Procedures  Procedures  ED Course / MDM   Clinical Course as of 07/21/23 1806  Thu Jul 21, 2023  1720 Dr Quintella Buck from hospitalist to evaluate for admission. [RP]    Clinical Course User Index [RP] Ninetta Basket, MD   Medical Decision Making Amount and/or Complexity of Data Reviewed Labs: ordered. Radiology: ordered.  Risk Prescription drug management. Decision regarding hospitalization.   53 year old male history of ESRD on Tuesday, Thursday, Saturday dialysis and recent left lower extremity AKA for infected left heel ulcer presents to the emergency department with nausea and generalized fatigue.  Patient was recently sent home after being in rehab last week.  Per wife has been only getting dialysis for partial sessions due to discomfort.  Reports that he has had some nausea and decreased appetite and generalized weakness since then.  Came into the emergency department for these symptoms.  Had dialysis today but is too weak to go home.  Does live at home with his wife but does not have any home health aides.  Wife is looking into SNF for the patient.  Discussed with hospitalist for admission.   Ninetta Basket, MD 07/21/23 [phone]

## 2023-07-21 NOTE — Progress Notes (Signed)
 Brief nephrology note  Patient receives dialysis Tuesday Thursday Saturday at [facility].  Refused to get dialysis there today for unclear reason.  Comes in needing dialysis.  We will provide dialysis and hopefully patient will discharge after that.  It is unclear why he would want to get dialysis here instead of his unit.  Recommend he discuss that with his dialysis unit

## 2023-07-22 DIAGNOSIS — R531 Weakness: Secondary | ICD-10-CM | POA: Diagnosis not present

## 2023-07-22 LAB — BASIC METABOLIC PANEL WITH GFR
Anion gap: 16 — ABNORMAL HIGH (ref 5–15)
BUN: 67 mg/dL — ABNORMAL HIGH (ref 6–20)
CO2: 23 mmol/L (ref 22–32)
Calcium: 8.6 mg/dL — ABNORMAL LOW (ref 8.9–10.3)
Chloride: 96 mmol/L — ABNORMAL LOW (ref 98–111)
Creatinine, Ser: 8.08 mg/dL — ABNORMAL HIGH (ref 0.61–1.24)
GFR, Estimated: 7 mL/min — ABNORMAL LOW (ref >60)
Glucose, Bld: 81 mg/dL (ref 70–99)
Potassium: 4 mmol/L (ref 3.5–5.1)
Sodium: 135 mmol/L (ref 135–145)

## 2023-07-22 LAB — GLUCOSE, CAPILLARY
Glucose-Capillary: 68 mg/dL — ABNORMAL LOW (ref 70–99)
Glucose-Capillary: 126 mg/dL — ABNORMAL HIGH (ref 70–99)
Glucose-Capillary: 168 mg/dL — ABNORMAL HIGH (ref 70–99)
Glucose-Capillary: 175 mg/dL — ABNORMAL HIGH (ref 70–99)

## 2023-07-22 LAB — IRON AND TIBC
Iron: 22 ug/dL — ABNORMAL LOW (ref 45–182)
Saturation Ratios: 17 % — ABNORMAL LOW (ref 17.9–39.5)
TIBC: 132 ug/dL — ABNORMAL LOW (ref 250–450)
UIBC: 110 ug/dL

## 2023-07-22 LAB — PHOSPHORUS: Phosphorus: 8.8 mg/dL — ABNORMAL HIGH (ref 2.5–4.6)

## 2023-07-22 LAB — FERRITIN: Ferritin: 789 ng/mL — ABNORMAL HIGH (ref 24–336)

## 2023-07-22 MED ORDER — CALCIUM ACETATE (PHOS BINDER) 667 MG PO CAPS
1334.0000 mg | ORAL_CAPSULE | Freq: Three times a day (TID) | ORAL | Status: DC
  Administered 2023-07-22 – 2023-07-25 (×6): 1334 mg via ORAL
  Filled 2023-07-22 (×8): qty 2

## 2023-07-22 MED ORDER — DOXERCALCIFEROL 4 MCG/2ML IV SOLN
5.0000 ug | INTRAVENOUS | Status: DC
  Administered 2023-07-23: 5 ug via INTRAVENOUS

## 2023-07-22 NOTE — Progress Notes (Signed)
 PROGRESS NOTE   [name], is a 53 y.o. male, DOB - [birth year], WUJ:[id number]  Admit date - 07/21/2023   Admitting Physician Ejiroghene Debroah Fanning, MD  Outpatient Primary MD for the patient is Ayesha Lente, FNP  LOS - 0  Chief Complaint  Patient presents with   Weakness   Emesis   Brief Narrative:  53 y.o. male with medical history significant for ESRD, diastolic CHF, DM2, HTN, coronary artery disease, seizures admitted on 07/21/23 with generalized weakness after missing hemodialysis sessions    -Assessment and Plan: 1)ESRD---TTS - PTA patient missed a couple of hemodialysis sessions - Had hemodialysis session here on admission on 07/21/2023 - Plan is for HD on 07/23/2023 and possible discharge after that if stable  2)Generalized weakness and Deconditioning--- multifactorial in the setting of missed hemodialysis session bilateral BKA and comorbid conditions - 3)Social/Ethics---  patient's 7th hospitalization in 6 months, in addition to 5 ED visits.  - Patient and his wife are Not willing to give up his Social Security check in order for him to go to long-term care in [place] - Plan is for him to return home after hemodialysis on 07/23/2023 - Patient's wife will explore possibilities for in-home HD  4)CAD/ H/o prior STEMI 02/2020 s/p DES to LAD Stable. -Continue aspirin  and atorvastatin  for secondary prevention -Continue Coreg    5)HTN - c/n amlodipine , Coreg , clonidine , hydralazine    6)PAFib - Pt is Not on systemic anticoagulation due to ICH while on Eliquis  in 11-2021. - Coreg  and amiodarone  for rate control  7)Chronic anemia of ESRD - Hgb greater than 9  - Serum iron and iron saturation are low - No bleeding concerns - IV iron and Epogen  therapy as per nephrology discretion.     8)DM2-- -A1C5.0 reflecting excellent diabetic control PTA -Use Novolog /Humalog Sliding scale insulin  with Accu-Cheks/Fingersticks as ordered    History of CVA (cerebrovascular  accident) - with ICH while on plavix  and Eliquis  in 11-2021. -Continue aspirin  and atorvastatin  for secondary prevention -blood pressure control and risk factor modification   History of seizure -Continue Keppra    Depression/presumed suicidal thoughts-during last hospitalization.  - During last hospitalization psychiatry service was been consulted and recommendations for outpatient follow-up  - Continue Zoloft  25 mg daily  Disposition: The patient is from: Home              Anticipated d/c is to: Home              Anticipated d/c date is: 1 day              Patient currently is not medically stable to d/c. Barriers: Not Clinically Stable- --anticipate discharge on 07/23/2023 after hemodialysis  Code Status :  -  Code Status: Limited: Do not attempt resuscitation (DNR) -DNR-LIMITED -Do Not Intubate/DNI    Family Communication:     (patient is alert, awake and coherent)  Discussed with wife  DVT Prophylaxis  :   - SCDs heparin  injection 5,000 Units Start: 07/21/23 2200   Lab Results  Component Value Date   PLT 203 07/21/2023    Inpatient Medications  Scheduled Meds:  amiodarone   200 mg Oral Daily   amLODipine   5 mg Oral Daily   aspirin  EC  81 mg Oral Q breakfast   atorvastatin   80 mg Oral QHS   brimonidine   1 drop Right Eye BID   calcium  acetate  1,334 mg Oral TID WC   carvedilol   12.5 mg Oral BID   Chlorhexidine  Gluconate Cloth  6 each Topical Q0600   [START ON 07/23/2023] cinacalcet   60 mg Oral Q T,Th,Sat-1800   cloNIDine   0.3 mg Oral Q8H   dorzolamide -timolol   1 drop Right Eye BID   [START ON 07/23/2023] doxercalciferol  5 mcg Intravenous Q T,Th,Sa-HD   heparin   5,000 Units Subcutaneous Q8H   hydrALAZINE   25 mg Oral TID   insulin  aspart  0-5 Units Subcutaneous QHS   insulin  aspart  0-9 Units Subcutaneous TID WC   levETIRAcetam   500 mg Oral Once per day on Sunday Monday Wednesday Friday   [START ON 07/23/2023] levETIRAcetam   750 mg Oral Q T,Th,Sa-HD   pentoxifylline   400  mg Oral TID   sertraline  25 mg Oral Daily   Continuous Infusions: PRN Meds:.acetaminophen  **OR** acetaminophen , metoCLOPramide  (REGLAN ) injection, oxyCODONE , polyethylene glycol   Anti-infectives (From admission, onward)    None         Subjective: [name] today has no fevers, no emesis,  No chest pain,   - Eating and drinking okay -Wife at bedside, questions answered - Complains of fatigue and generalized weakness  Objective: Vitals:   07/21/23 2146 07/21/23 2355 07/22/23 0345 07/22/23 0900  BP: (!) 154/67 (!) 159/71 (!) 140/63 129/71  Pulse: (!) 59 (!) 59 68 (!) 59  Resp: 16 18 16 16   Temp: 98.1 F (36.7 C) 98.4 F (36.9 C) 98.7 F (37.1 C)   TempSrc: Oral Oral Oral   SpO2:  96% 97%   Weight: 68 kg     Height: 6\' 3"  (1.905 m)       Intake/Output Summary (Last 24 hours) at 07/22/2023 1836 Last data filed at 07/22/2023 1758 Gross per 24 hour  Intake 478 ml  Output --  Net 478 ml   Filed Weights   07/21/23 0652 07/21/23 1844 07/21/23 2146  Weight: 72.4 kg 68 kg 68 kg    Physical Exam  Gen:- Awake Alert,  in no apparent distress  HEENT:- [place].AT, No sclera icterus, legally blind Neck-Supple Neck,No JVD,.  Lungs-  CTAB , fair symmetrical air movement CV- S1, S2 normal, regular  Abd-  +ve B.Sounds, Abd Soft, No tenderness,    Extremity--bilateral AKA Psych-affect is appropriate, oriented x3 Neuro-no new focal deficits, no tremors MSK-left arm AV fistula with bruit and thrill  Data Reviewed: I have personally reviewed following labs and imaging studies  CBC: Recent Labs  Lab 07/19/23 0344 07/21/23 0731  WBC 14.4* 13.4*  NEUTROABS 9.6*  --   HGB 8.7* 9.3*  HCT 28.1* 30.2*  MCV 75.1* 75.3*  PLT 243 203   Basic Metabolic Panel: Recent Labs  Lab 07/19/23 0344 07/21/23 0731 07/22/23 0452  NA 136 136 135  K 5.2* 4.9 4.0  CL 99 95* 96*  CO2 18* 19* 23  GLUCOSE 93 79 81  BUN 143* 104* 67*  CREATININE 12.66* 11.27* 8.08*  CALCIUM  8.4*  8.6* 8.6*  PHOS  --   --  8.8*   GFR: Estimated Creatinine Clearance: 9.9 mL/min (A) (by C-G formula based on SCr of 8.08 mg/dL (H)).  Radiology Studies: DG Chest Portable 1 View Result Date: 07/21/2023 CLINICAL DATA:  Generalized weakness, skipped dialysis today due to feeling poorly. EXAM: PORTABLE CHEST 1 VIEW COMPARISON:  Portable chest 07/19/2023 at 3:55 a.m. FINDINGS: 7:20 a.m. Stable moderate cardiomegaly. There is a coronary artery stent on the left. The mediastinum is unchanged. There is aortic tortuosity and mild ectasia with atherosclerosis. Linear scarring or atelectasis again is noted in the right base and  a small right pleural effusion. The lungs are otherwise clear. No new abnormality. Stable bony thorax. IMPRESSION: 1. No significant change from the previous study. 2. Cardiomegaly. 3. Small right pleural effusion. 4. Aortic atherosclerosis. Electronically Signed   By: Denman Fischer M.D.   On: 07/21/2023 07:40   Scheduled Meds:  amiodarone   200 mg Oral Daily   amLODipine   5 mg Oral Daily   aspirin  EC  81 mg Oral Q breakfast   atorvastatin   80 mg Oral QHS   brimonidine   1 drop Right Eye BID   calcium  acetate  1,334 mg Oral TID WC   carvedilol   12.5 mg Oral BID   Chlorhexidine  Gluconate Cloth  6 each Topical Q0600   [START ON 07/23/2023] cinacalcet   60 mg Oral Q T,Th,Sat-1800   cloNIDine   0.3 mg Oral Q8H   dorzolamide -timolol   1 drop Right Eye BID   [START ON 07/23/2023] doxercalciferol  5 mcg Intravenous Q T,Th,Sa-HD   heparin   5,000 Units Subcutaneous Q8H   hydrALAZINE   25 mg Oral TID   insulin  aspart  0-5 Units Subcutaneous QHS   insulin  aspart  0-9 Units Subcutaneous TID WC   levETIRAcetam   500 mg Oral Once per day on Sunday Monday Wednesday Friday   [START ON 07/23/2023] levETIRAcetam   750 mg Oral Q T,Th,Sa-HD   pentoxifylline   400 mg Oral TID   sertraline  25 mg Oral Daily   Continuous Infusions:   LOS: 0 days   Colin Dawley M.D on 07/22/2023 at 6:36 PM  Go to  [email or web address] - for contact info  [facility] - Office  [phone]  If 7PM-7AM, please contact night-coverage [email or web address] 07/22/2023, 6:36 PM

## 2023-07-22 NOTE — Consult Note (Signed)
 ESRD Consult Note  Requesting provider: Courage Emokpae Service requesting consult: Hospitalist Reason for consult: ESRD, provision of dialysis Indication for acute dialysis?: End Stage Renal Disease  Outpatient dialysis unit: [facility]   Assessment/Recommendations: [name]. is a/an 53 y.o. male with a past medical history notable for ESRD on HD admitted with weakness  # ESRD: Continue HD on TTS schedule  # Volume/ hypertension: Appears euvolemic.  Blood pressure fairly well-controlled on current medications  # Anemia of Chronic Kidney Disease: Hemoglobin 9.3. Obtain iron studies. Consider ESA  # Secondary Hyperparathyroidism/Hyperphosphatemia: Ca corrects to normal. Obtain phosphorus level.  Continue home Sensipar , and phoslo . On calcitriol  9mcg 3x weekly. Given pill burden will use hectorol 5mcg 3x weekly here  # Vascular access: AVF with no issues  # Weakness/failure to thrive: Management per primary team.  Consider palliative care.    # Additional recommendations: - Dose all meds for creatinine clearance < 10 ml/min  - Unless absolutely necessary, no MRIs with gadolinium.  - Implement save arm precautions.  Prefer needle sticks in the dorsum of the hands or wrists.  No blood pressure measurements in arm. - If blood transfusion is requested during hemodialysis sessions, please alert us  prior to the session.  - Use synthetic opioids (Fentanyl /Dilaudid ) if needed  Recommendations were discussed with the primary team.   History of Present Illness: [name]. is a/an 53 y.o. male with a past medical history of ESRD who presents with weakness  Patient presented to the hospital yesterday for worsening generalized weakness.  Patient normally gets dialysis Tuesday Thursday Saturday but was too weak to go to dialysis yesterday.  He also has had some vomiting for couple days prior to presentation but denies abdominal pain or diarrhea.  He has some  shortness of breath which is chronic and unchanged but no chest pain.  Denies fevers or chills.  States that dialysis has been going well with no issues when he is able to go but sometimes gets off early because of his sacral wound.  He has been hospitalized multiple times for a myriad of issues recently.  Patient's wife expressed difficulty in caring for him and feels like he needs to go to a nursing home again.  Patient underwent dialysis yesterday in the hospital and says it went well.   Medications:  Current Facility-Administered Medications  Medication Dose Route Frequency Provider Last Rate Last Admin   acetaminophen  (TYLENOL ) tablet 650 mg  650 mg Oral Q6H PRN Emokpae, Ejiroghene E, MD       Or   acetaminophen  (TYLENOL ) suppository 650 mg  650 mg Rectal Q6H PRN Emokpae, Ejiroghene E, MD       amiodarone  (PACERONE ) tablet 200 mg  200 mg Oral Daily Emokpae, Ejiroghene E, MD   200 mg at 07/22/23 0903   amLODipine  (NORVASC ) tablet 5 mg  5 mg Oral Daily Emokpae, Ejiroghene E, MD   5 mg at 07/22/23 0903   aspirin  EC tablet 81 mg  81 mg Oral Q breakfast Emokpae, Ejiroghene E, MD   81 mg at 07/22/23 1610   atorvastatin  (LIPITOR ) tablet 80 mg  80 mg Oral QHS Emokpae, Ejiroghene E, MD   80 mg at 07/21/23 2121   brimonidine  (ALPHAGAN ) 0.2 % ophthalmic solution 1 drop  1 drop Right Eye BID Emokpae, Ejiroghene E, MD   1 drop at 07/22/23 0920   calcium  acetate (PHOSLO ) capsule 1,334 mg  1,334 mg Oral TID WC Emokpae, Courage, MD   1,334 mg at  07/22/23 0919   carvedilol  (COREG ) tablet 12.5 mg  12.5 mg Oral BID Emokpae, Ejiroghene E, MD   12.5 mg at 07/22/23 0865   Chlorhexidine  Gluconate Cloth 2 % PADS 6 each  6 each Topical Q0600 Emokpae, Ejiroghene E, MD   6 each at 07/22/23 0533   [START ON 07/23/2023] cinacalcet  (SENSIPAR ) tablet 60 mg  60 mg Oral Q T,Th,Sat-1800 Emokpae, Ejiroghene E, MD       cloNIDine  (CATAPRES ) tablet 0.3 mg  0.3 mg Oral Q8H Emokpae, Ejiroghene E, MD   0.3 mg at 07/22/23 7846    dorzolamide -timolol  (COSOPT ) 2-0.5 % ophthalmic solution 1 drop  1 drop Right Eye BID Emokpae, Ejiroghene E, MD   1 drop at 07/22/23 0920   heparin  injection 5,000 Units  5,000 Units Subcutaneous Q8H Emokpae, Ejiroghene E, MD   5,000 Units at 07/22/23 9629   hydrALAZINE  (APRESOLINE ) tablet 25 mg  25 mg Oral TID Emokpae, Ejiroghene E, MD   25 mg at 07/22/23 5284   insulin  aspart (novoLOG ) injection 0-5 Units  0-5 Units Subcutaneous QHS Emokpae, Ejiroghene E, MD       insulin  aspart (novoLOG ) injection 0-9 Units  0-9 Units Subcutaneous TID WC Emokpae, Ejiroghene E, MD       levETIRAcetam  (KEPPRA ) tablet 500 mg  500 mg Oral Once per day on Sunday Monday Wednesday Friday Emokpae, Arnie Lao, MD       [START ON 07/23/2023] levETIRAcetam  (KEPPRA ) tablet 750 mg  750 mg Oral Q T,Th,Sa-HD Emokpae, Ejiroghene E, MD       metoCLOPramide  (REGLAN ) injection 5 mg  5 mg Intravenous Q8H PRN Emokpae, Ejiroghene E, MD       oxyCODONE  (Oxy IR/ROXICODONE ) immediate release tablet 5 mg  5 mg Oral Q8H PRN Emokpae, Ejiroghene E, MD       pentoxifylline  (TRENTAL ) CR tablet 400 mg  400 mg Oral TID Emokpae, Ejiroghene E, MD   400 mg at 07/22/23 0904   polyethylene glycol (MIRALAX  / GLYCOLAX ) packet 17 g  17 g Oral Daily PRN Emokpae, Ejiroghene E, MD       sertraline  (ZOLOFT ) tablet 25 mg  25 mg Oral Daily Emokpae, Ejiroghene E, MD   25 mg at 07/22/23 0903     ALLERGIES Promethazine   MEDICAL HISTORY Past Medical History:  Diagnosis Date   Acute Bil  CVAs of Bil Frontal amd Rt Parietal  in Watershed Distribution 04/21/2021   Patchy acute cortically-based infarcts within the bilateral high  frontal lobes and right parietal lobe (predominantly in a watershed  distribution).   Subcentimeter acute infarct within the callosal splenium on the  left.     Acute bilateral cerebral infarction in a watershed distribution [facility]) 04/21/2021   a.) MRI brain 04/21/2021 --> patchy ACUTE cortically-based infarcts within the bilateral  high frontal lobes and right parietal lobe   Acute cerebral infarction (HCC) 02/13/2021   a.) MRI brain 02/13/2021: acute LEFT hippocampal infarct   Acute cerebral infarction (HCC) 04/21/2021   a.) MRI brain 04/21/2021: subcentimeter ACUTE infarct within the callosal splenium on the LEFT   Acute osteomyelitis of toe of right foot with gangrene (HCC) 02/26/2022   Anaphylactic reaction due to adverse effect of correct drug or medicament properly administered, initial encounter 05/08/2019   Anemia of chronic renal failure    Anxiety    Aortic dilatation (HCC) 11/25/2020   a.) TTE 11/25/2020: Ao root measured 38 mm. b.) TTE 04/22/2021: Ao root measured 44 mm; c. 11/2021 Echo: Ao root 40mm.  Atrial fibrillation (HCC)    a.) CHA2DS2-VASc = 6 (CHF, HTN, CVA x2, prior MI, T2DM). b.) rate/rhythm maintained on oral amiodarone  + carvedilol ; chronic OAC/AP therapy discontinued following ICH   Benign prostatic hyperplasia with lower urinary tract symptoms 11/07/2019   BPH (benign prostatic hyperplasia)    Cardiac arrest (HCC) 07/26/2021   a.) in setting of hyperkalemia, NSTEMI, and seizure following missed HD; pulseless and apneic --> required 1 round of CPR prior to ROSC   Cellulitis    Cerebral hemorrhage (HCC) 11/21/2021   a.) CT head 11/21/2021 --> 1.4 x 1.5 x 3.1 cm ACUTE hemorrhage within the inferior aspect of the fourth ventricle, and extending inferiorly through the foramen of foramen of [place] --> occurred in setting of HTN emergency and DOAC/APT-->eliquis /plavix  d/c'd.   Cerebral microvascular disease    Chronic diarrhea    Chronic heart failure with preserved ejection fraction (HFpEF) (HCC) 07/19/2017   a.) 06/2017 Echo: EF 75%; b.) 11/2020 Echo: EF 55-60%; c.) 04/2021 Echo: EF 55-60%; d.) 11/2021 Echo: EF 65-70%, GrII DD, nl RV fxn, sev dil LA, large circumferential pericardial eff w/o tamponade, triv MR, AoV sclerosis. Ao root 40mm; e.) TTE 02/28/2025: EF 60-65%, sev LVH, mod posterior LV  effusion, AoV sclerosis   Chronic pain of both ankles    Coronary artery disease 02/26/2020   a. 02/2020 Ant STEMI/PCI: LM nl, LAD 29m (3.5x30 Resolute Onyx), RI nl, LCX min irregs, RCA min irregs. EF 65%.   DDD (degenerative disc disease), cervical    Diabetes mellitus, type II (HCC)    Diabetic neuropathy (HCC)    Esophagitis    ESRD (end stage renal disease) on dialysis (HCC)    a.) [facility]; T-Th-Sat   Frequent falls    Gait instability    Gastritis    Gastroparesis    GERD (gastroesophageal reflux disease)    H/O enucleation of left eyeball    Heart palpitations    History of kidney stones    HLD (hyperlipidemia)    Hypertension    Insomnia    a.) uses melatonin PRN   Long term current use of amiodarone     Nausea and vomiting in adult    recurrent   NSTEMI (non-ST elevated myocardial infarction) (HCC) 07/26/2021   a.) hyperkalemic from missed HD; seizures; decompensated to cardiac arrest and required CPR x 1 round prior to ROSC.   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  Added automatically from request for surgery [id number]     Osteomyelitis [facility])    Pericardial effusion    a. 11/2021 Echo: EF 65-70%, GrII DD, nl RV fxn, sev dil LA, large circumferential pericardial eff w/o tamponade, triv MR, AoV sclerosis. Ao root 40mm.   Seizure (HCC)    a.) last 07/26/2021 in setting of missed HD --> hyperkalemic at 6.5 --> pulseless/apneic and required CPR; discharged home on levetiracetam .   ST elevation myocardial infarction (STEMI) of anterior wall (HCC) 02/26/2020   a.) LHC/PCI 02/26/2020 --> EF 65%; LVEDP 11 mmHg; 90% mLAD (3.5 x 20 mm Resolute Onyx DES x 1)     SOCIAL HISTORY Social History   Socioeconomic History   Marital status: Married    Spouse name: [name]    Number of children: 2   Years of education: Not on file   Highest education level: High school graduate  Occupational  History   Occupation: Disability    Comment: not employed  Tobacco Use  Smoking status: Never   Smokeless tobacco: Never  Vaping Use   Vaping status: Never Used  Substance and Sexual Activity   Alcohol  use: No   Drug use: No   Sexual activity: Yes  Other Topics Concern   Not on file  Social History Narrative   Oldest son killed in car crash June 2020.  Lives in [place] with his wife.   Social Drivers of Corporate investment banker Strain: High Risk (07/05/2017)   Overall Financial Resource Strain (CARDIA)    Difficulty of Paying Living Expenses: Hard  Food Insecurity: No Food Insecurity (07/21/2023)   Hunger Vital Sign    Worried About Running Out of Food in the Last Year: Never true    Ran Out of Food in the Last Year: Never true  Transportation Needs: No Transportation Needs (07/21/2023)   PRAPARE - Administrator, Civil Service (Medical): No    Lack of Transportation (Non-Medical): No  Physical Activity: Inactive (07/05/2017)   Exercise Vital Sign    Days of Exercise per Week: 0 days    Minutes of Exercise per Session: 0 min  Stress: Stress Concern Present (07/05/2017)   [employer] of Occupational Health - Occupational Stress Questionnaire    Feeling of Stress : Rather much  Social Connections: Moderately Integrated (07/21/2023)   Social Connection and Isolation Panel [NHANES]    Frequency of Communication with Friends and Family: Three times a week    Frequency of Social Gatherings with Friends and Family: Three times a week    Attends Religious Services: 1 to 4 times per year    Active Member of Clubs or Organizations: No    Attends Banker Meetings: Patient declined    Marital Status: Married  Catering manager Violence: Not At Risk (07/21/2023)   Humiliation, Afraid, Rape, and Kick questionnaire    Fear of Current or Ex-Partner: No    Emotionally Abused: No    Physically Abused: No    Sexually Abused: No     FAMILY  HISTORY Family History  Problem Relation Age of Onset   CAD Father    Stroke Father    Diabetes Mellitus II Mother    Kidney failure Mother    Schizophrenia Mother      Review of Systems: 12 systems were reviewed and negative except per HPI  Physical Exam: Vitals:   07/22/23 0345 07/22/23 0900  BP: (!) 140/63 129/71  Pulse: 68 (!) 59  Resp: 16 16  Temp: 98.7 F (37.1 C)   SpO2: 97%    Total I/O In: 240 [P.O.:240] Out: -   Intake/Output Summary (Last 24 hours) at 07/22/2023 1041 Last data filed at 07/22/2023 0825 Gross per 24 hour  Intake 240 ml  Output 1000 ml  Net -760 ml   General: well-appearing, no acute distress HEENT: anicteric sclera, MMM CV: normal rate, no rub, no edema in stump, both legs absent above the knee Lungs: bilateral chest rise, normal wob Abd: soft, non-tender, non-distended Skin: no visible lesions or rashes Psych: alert, engaged, appropriate mood and affect Neuro: normal speech, no gross focal deficits   Test Results Reviewed Lab Results  Component Value Date   NA 135 07/22/2023   K 4.0 07/22/2023   CL 96 (L) 07/22/2023   CO2 23 07/22/2023   BUN 67 (H) 07/22/2023   CREATININE 8.08 (H) 07/22/2023   CALCIUM  8.6 (L) 07/22/2023   ALBUMIN  2.3 (L) 07/13/2023   PHOS 10.6 (H) 07/06/2023  I have reviewed relevant outside healthcare records

## 2023-07-22 NOTE — TOC Transition Note (Signed)
 Transition of Care [facility]) - Discharge Note   Patient Details  Name: [name]. MRN: [id number] Date of Birth: 08/26/1970  [facility] CM/SW Contact:  Orelia Binet, RN Phone Number: 07/22/2023, 1:35 PM   Clinical Narrative:   Patient admitted with generalized weakness. HD patient. CM spoke with [name], wife to get an update on placement. [name] states she will soon be out of work for the summer. She plans to work on moving assess, changing him to home HD and has a plan to get him qualified for medicaid. She spoke with a facility in [place], they will take him for his check. At this time she can not give up his check. She realizes she has a lot to do, but with work on a plan to get him in a facility. She is aware and PCP told her the will do an FL2 when needed.    Final next level of care: Home w Home Health Services Barriers to Discharge: Continued Medical Work up   Patient Goals and CMS Choice Patient states their goals for this hospitalization and ongoing recovery are:: return home    Discharge Plan and Services Additional resources added to the After Visit Summary for        Social Drivers of Health (SDOH) Interventions SDOH Screenings   Food Insecurity: No Food Insecurity (07/21/2023)  Housing: Low Risk  (07/21/2023)  Recent Concern: Housing - High Risk (05/22/2023)  Transportation Needs: No Transportation Needs (07/21/2023)  Utilities: Not At Risk (07/21/2023)  Financial Resource Strain: High Risk (07/05/2017)  Physical Activity: Inactive (07/05/2017)  Social Connections: Moderately Integrated (07/21/2023)  Stress: Stress Concern Present (07/05/2017)  Tobacco Use: Low Risk  (07/21/2023)  Health Literacy: Low Risk  (06/01/2020)   Received from [facility], [facility]     Readmission Risk Interventions    06/25/2023    9:22 AM 06/21/2023   10:51 AM 05/17/2023    3:44 PM  Readmission Risk Prevention Plan  Transportation Screening Complete  Complete Complete  Medication Review (RN Care Manager) Complete Complete Referral to Pharmacy  PCP or Specialist appointment within 3-5 days of discharge Complete  Complete  HRI or Home Care Consult Complete Complete Complete  SW Recovery Care/Counseling Consult Complete Complete Complete  Palliative Care Screening Not Applicable Not Applicable Not Applicable  Skilled Nursing Facility Complete Not Applicable Not Applicable

## 2023-07-22 NOTE — Plan of Care (Signed)
  Problem: Education: Goal: Knowledge of General Education information will improve Description: Including pain rating scale, medication(s)/side effects and non-pharmacologic comfort measures Outcome: Progressing   Problem: Clinical Measurements: Goal: Diagnostic test results will improve Outcome: Progressing   Problem: Clinical Measurements: Goal: Ability to maintain clinical measurements within normal limits will improve Outcome: Progressing   Problem: Activity: Goal: Risk for activity intolerance will decrease Outcome: Progressing   Problem: Nutrition: Goal: Adequate nutrition will be maintained Outcome: Progressing

## 2023-07-22 NOTE — Plan of Care (Signed)
   Problem: Education: Goal: Knowledge of General Education information will improve Description: Including pain rating scale, medication(s)/side effects and non-pharmacologic comfort measures Outcome: Progressing   Problem: Health Behavior/Discharge Planning: Goal: Ability to manage health-related needs will improve Outcome: Progressing   Problem: Clinical Measurements: Goal: Diagnostic test results will improve Outcome: Progressing

## 2023-07-22 NOTE — Care Management Obs Status (Signed)
 MEDICARE OBSERVATION STATUS NOTIFICATION   Patient Details  Name: [name]. MRN: [id number] Date of Birth: [birth year]   Medicare Observation Status Notification Given:  Yes    Geraldina Klinefelter, RN 07/22/2023, 5:38 PM

## 2023-07-23 DIAGNOSIS — R531 Weakness: Secondary | ICD-10-CM | POA: Diagnosis not present

## 2023-07-23 LAB — CBC
HCT: 28.8 % — ABNORMAL LOW (ref 39.0–52.0)
Hemoglobin: 8.6 g/dL — ABNORMAL LOW (ref 13.0–17.0)
MCH: 22 pg — ABNORMAL LOW (ref 26.0–34.0)
MCHC: 29.9 g/dL — ABNORMAL LOW (ref 30.0–36.0)
MCV: 73.7 fL — ABNORMAL LOW (ref 80.0–100.0)
Platelets: 176 10*3/uL (ref 150–400)
RBC: 3.91 MIL/uL — ABNORMAL LOW (ref 4.22–5.81)
RDW: 22.9 % — ABNORMAL HIGH (ref 11.5–15.5)
WBC: 12.6 10*3/uL — ABNORMAL HIGH (ref 4.0–10.5)
nRBC: 0.3 % — ABNORMAL HIGH (ref 0.0–0.2)

## 2023-07-23 LAB — RENAL FUNCTION PANEL
Albumin: 2.2 g/dL — ABNORMAL LOW (ref 3.5–5.0)
Anion gap: 18 — ABNORMAL HIGH (ref 5–15)
BUN: 78 mg/dL — ABNORMAL HIGH (ref 6–20)
CO2: 22 mmol/L (ref 22–32)
Calcium: 8.8 mg/dL — ABNORMAL LOW (ref 8.9–10.3)
Chloride: 95 mmol/L — ABNORMAL LOW (ref 98–111)
Creatinine, Ser: 9.55 mg/dL — ABNORMAL HIGH (ref 0.61–1.24)
GFR, Estimated: 6 mL/min — ABNORMAL LOW (ref >60)
Glucose, Bld: 102 mg/dL — ABNORMAL HIGH (ref 70–99)
Phosphorus: 8.9 mg/dL — ABNORMAL HIGH (ref 2.5–4.6)
Potassium: 4.2 mmol/L (ref 3.5–5.1)
Sodium: 135 mmol/L (ref 135–145)

## 2023-07-23 LAB — GLUCOSE, CAPILLARY
Glucose-Capillary: 87 mg/dL (ref 70–99)
Glucose-Capillary: 104 mg/dL — ABNORMAL HIGH (ref 70–99)
Glucose-Capillary: 86 mg/dL (ref 70–99)

## 2023-07-23 LAB — HEPATITIS B SURFACE ANTIGEN: Hepatitis B Surface Ag: NONREACTIVE

## 2023-07-23 MED ORDER — LEVETIRACETAM 500 MG PO TABS: 500.0000 mg | ORAL_TABLET | ORAL | 3 refills | Status: DC

## 2023-07-23 MED ORDER — PENTAFLUOROPROP-TETRAFLUOROETH EX AERO
INHALATION_SPRAY | CUTANEOUS | Status: AC
  Filled 2023-07-23: qty 30

## 2023-07-23 MED ORDER — DOXERCALCIFEROL 4 MCG/2ML IV SOLN
INTRAVENOUS | Status: AC
  Filled 2023-07-23: qty 4

## 2023-07-23 MED ORDER — AMLODIPINE BESYLATE 5 MG PO TABS: 5.0000 mg | ORAL_TABLET | Freq: Every day | ORAL | 5 refills | Status: DC

## 2023-07-23 MED ORDER — SERTRALINE HCL 25 MG PO TABS: 25.0000 mg | ORAL_TABLET | Freq: Every day | ORAL | 5 refills | Status: DC

## 2023-07-23 MED ORDER — PENTOXIFYLLINE ER 400 MG PO TBCR: 400.0000 mg | EXTENDED_RELEASE_TABLET | Freq: Three times a day (TID) | ORAL | 5 refills | Status: DC

## 2023-07-23 MED ORDER — ASPIRIN 81 MG PO TBEC: 81.0000 mg | DELAYED_RELEASE_TABLET | Freq: Every day | ORAL | 2 refills | Status: DC

## 2023-07-23 NOTE — Procedures (Signed)
   HEMODIALYSIS TREATMENT NOTE:  Flat affect, withdrawn pre-HD.  Stated from onset of treatment "I'm only running 3 hours."  AVF was cannulated without issues.  Hemodynamically stable.  Started asking to end treatment as early as 2.5 hours into session.  "Just don't feel good."  Denied specific complaints. Was educated as to side effects of uremia and recommendations.  Unwilling to complete full session; he often signs off of HD early.  After 2 hours and 50 minutes of treatment he declared "take me off this thing!"  All blood was returned.  Net UF 1 liter. Dr. Christianne Cowper was notified.  Daphna Lafuente, RN AP [facility]

## 2023-07-23 NOTE — Plan of Care (Signed)
   Problem: Education: Goal: Knowledge of General Education information will improve Description: Including pain rating scale, medication(s)/side effects and non-pharmacologic comfort measures Outcome: Progressing   Problem: Clinical Measurements: Goal: Ability to maintain clinical measurements within normal limits will improve Outcome: Progressing Goal: Diagnostic test results will improve Outcome: Progressing

## 2023-07-23 NOTE — Discharge Instructions (Signed)
 1)Very Low-salt diet advised---Less than 2 gm of Sodium per day advised----ok to use Mrs DASH salt substitute instead of Salt 2)Limit your Fluid  intake to no more than 50 ounces (1.5 Liters) per day 3) continue hemodialysis on Tuesdays Thursdays and Saturdays per usual schedule

## 2023-07-23 NOTE — TOC Transition Note (Signed)
 Transition of Care [facility]) - Discharge Note   Patient Details  Name: [name]. MRN: [id number] Date of Birth: 03-07-1970  Transition [facility]) CM/SW Contact:  Lynda Sands, RN Phone Number: 07/23/2023, 5:35 PM   Clinical Narrative:   Patient is established patient with [facility]. [facility] notified patient will resume services.    Final next level of care: Home w Home Health Services Barriers to Discharge: No Barriers Identified   Patient Goals and CMS Choice Patient states their goals for this hospitalization and ongoing recovery are:: return home     Discharge Placement     Discharge home with [facility].  Discharge Plan and Services Additional resources added to the After Visit Summary for      DME Agency: [facility] Date DME Agency Contacted: 07/23/23 Time DME Agency Contacted: [id number] Representative spoke with at DME Agency: [facility] HH Arranged: PT HH Agency: [facility]        Social Drivers of Health (SDOH) Interventions SDOH Screenings   Food Insecurity: No Food Insecurity (07/21/2023)  Housing: Low Risk  (07/21/2023)  Recent Concern: Housing - High Risk (05/22/2023)  Transportation Needs: No Transportation Needs (07/21/2023)  Utilities: Not At Risk (07/21/2023)  Financial Resource Strain: High Risk (07/05/2017)  Physical Activity: Inactive (07/05/2017)  Social Connections: Moderately Integrated (07/21/2023)  Stress: Stress Concern Present (07/05/2017)  Tobacco Use: Low Risk  (07/21/2023)  Health Literacy: Low Risk  (06/01/2020)   Received from [facility], [facility]     Readmission Risk Interventions    06/25/2023    9:22 AM 06/21/2023   10:51 AM 05/17/2023    3:44 PM  Readmission Risk Prevention Plan  Transportation Screening Complete Complete Complete  Medication Review (RN Care Manager) Complete Complete Referral to Pharmacy  PCP or Specialist appointment within 3-5 days of discharge Complete  Complete   HRI or Home Care Consult Complete Complete Complete  SW Recovery Care/Counseling Consult Complete Complete Complete  Palliative Care Screening Not Applicable Not Applicable Not Applicable  Skilled Nursing Facility Complete Not Applicable Not Applicable

## 2023-07-23 NOTE — Discharge Summary (Signed)
 [name]., is a 53 y.o. male  DOB [birth year]  MRN [id number].  Admission date:  07/21/2023  Admitting Physician  Pati Bonine, MD  Discharge Date:  07/23/2023   Primary MD  Ayesha Lente, FNP  Recommendations for primary care physician for things to follow:  1)Very Low-salt diet advised---Less than 2 gm of Sodium per day advised----ok to use Mrs DASH salt substitute instead of Salt 2)Limit your Fluid  intake to no more than 50 ounces (1.5 Liters) per day 3) continue hemodialysis on Tuesdays Thursdays and Saturdays per usual schedule  Admission Diagnosis  End stage renal disease on dialysis (HCC) [N18.6, Z99.2] Generalized weakness [R53.1]   Discharge Diagnosis  End stage renal disease on dialysis (HCC) [N18.6, Z99.2] Generalized weakness [R53.1]    Principal Problem:   Generalized weakness Active Problems:   Anemia due to chronic kidney disease, on chronic dialysis (HCC)   Chronic Grade 2 diastolic heart failure/EF 55 to 60%   ESRD on hemodialysis [facility])   CAD/ H/o prior STEMI 02/2020 s/p DES to LAD   Essential hypertension, benign   Paroxysmal atrial fibrillation (HCC) - not on systemic anticoagulation due to ICH while on Eliquis  in 11-2021.   Diabetes mellitus, type II (HCC)   Seizures (HCC)      Past Medical History:  Diagnosis Date   Acute Bil  CVAs of Bil Frontal amd Rt Parietal  in Watershed Distribution 04/21/2021   Patchy acute cortically-based infarcts within the bilateral high  frontal lobes and right parietal lobe (predominantly in a watershed  distribution).   Subcentimeter acute infarct within the callosal splenium on the  left.     Acute bilateral cerebral infarction in a watershed distribution [facility]) 04/21/2021   a.) MRI brain 04/21/2021 --> patchy ACUTE cortically-based infarcts within the bilateral high frontal lobes and right parietal lobe   Acute cerebral  infarction (HCC) 02/13/2021   a.) MRI brain 02/13/2021: acute LEFT hippocampal infarct   Acute cerebral infarction (HCC) 04/21/2021   a.) MRI brain 04/21/2021: subcentimeter ACUTE infarct within the callosal splenium on the LEFT   Acute osteomyelitis of toe of right foot with gangrene (HCC) 02/26/2022   Anaphylactic reaction due to adverse effect of correct drug or medicament properly administered, initial encounter 05/08/2019   Anemia of chronic renal failure    Anxiety    Aortic dilatation (HCC) 11/25/2020   a.) TTE 11/25/2020: Ao root measured 38 mm. b.) TTE 04/22/2021: Ao root measured 44 mm; c. 11/2021 Echo: Ao root 40mm.   Atrial fibrillation (HCC)    a.) CHA2DS2-VASc = 6 (CHF, HTN, CVA x2, prior MI, T2DM). b.) rate/rhythm maintained on oral amiodarone  + carvedilol ; chronic OAC/AP therapy discontinued following ICH   Benign prostatic hyperplasia with lower urinary tract symptoms 11/07/2019   BPH (benign prostatic hyperplasia)    Cardiac arrest (HCC) 07/26/2021   a.) in setting of hyperkalemia, NSTEMI, and seizure following missed HD; pulseless and apneic --> required 1 round of CPR prior to ROSC   Cellulitis    Cerebral  hemorrhage (HCC) 11/21/2021   a.) CT head 11/21/2021 --> 1.4 x 1.5 x 3.1 cm ACUTE hemorrhage within the inferior aspect of the fourth ventricle, and extending inferiorly through the foramen of foramen of [place] --> occurred in setting of HTN emergency and DOAC/APT-->eliquis /plavix  d/c'd.   Cerebral microvascular disease    Chronic diarrhea    Chronic heart failure with preserved ejection fraction (HFpEF) (HCC) 07/19/2017   a.) 06/2017 Echo: EF 75%; b.) 11/2020 Echo: EF 55-60%; c.) 04/2021 Echo: EF 55-60%; d.) 11/2021 Echo: EF 65-70%, GrII DD, nl RV fxn, sev dil LA, large circumferential pericardial eff w/o tamponade, triv MR, AoV sclerosis. Ao root 40mm; e.) TTE 02/28/2025: EF 60-65%, sev LVH, mod posterior LV effusion, AoV sclerosis   Chronic pain of both ankles     Coronary artery disease 02/26/2020   a. 02/2020 Ant STEMI/PCI: LM nl, LAD 15m (3.5x30 Resolute Onyx), RI nl, LCX min irregs, RCA min irregs. EF 65%.   DDD (degenerative disc disease), cervical    Diabetes mellitus, type II (HCC)    Diabetic neuropathy (HCC)    Esophagitis    ESRD (end stage renal disease) on dialysis (HCC)    a.) [facility]; T-Th-Sat   Frequent falls    Gait instability    Gastritis    Gastroparesis    GERD (gastroesophageal reflux disease)    H/O enucleation of left eyeball    Heart palpitations    History of kidney stones    HLD (hyperlipidemia)    Hypertension    Insomnia    a.) uses melatonin PRN   Long term current use of amiodarone     Nausea and vomiting in adult    recurrent   NSTEMI (non-ST elevated myocardial infarction) (HCC) 07/26/2021   a.) hyperkalemic from missed HD; seizures; decompensated to cardiac arrest and required CPR x 1 round prior to ROSC.   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  Added automatically from request for surgery [id number]     Osteomyelitis [facility])    Pericardial effusion    a. 11/2021 Echo: EF 65-70%, GrII DD, nl RV fxn, sev dil LA, large circumferential pericardial eff w/o tamponade, triv MR, AoV sclerosis. Ao root 40mm.   Seizure (HCC)    a.) last 07/26/2021 in setting of missed HD --> hyperkalemic at 6.5 --> pulseless/apneic and required CPR; discharged home on levetiracetam .   ST elevation myocardial infarction (STEMI) of anterior wall (HCC) 02/26/2020   a.) LHC/PCI 02/26/2020 --> EF 65%; LVEDP 11 mmHg; 90% mLAD (3.5 x 20 mm Resolute Onyx DES x 1)    Past Surgical History:  Procedure Laterality Date   A/V FISTULAGRAM Left 08/31/2021   Procedure: A/V Fistulagram;  Surgeon: Celso College, MD;  Location: [name] INVASIVE CV LAB;  Service: Cardiovascular;  Laterality: Left;   A/V FISTULAGRAM Left 01/25/2022   Procedure: A/V Fistulagram;  Surgeon:  Celso College, MD;  Location: [name] INVASIVE CV LAB;  Service: Cardiovascular;  Laterality: Left;   A/V FISTULAGRAM Right 10/18/2022   Procedure: A/V Fistulagram;  Surgeon: Celso College, MD;  Location: [name] INVASIVE CV LAB;  Service: Cardiovascular;  Laterality: Right;   ABDOMINAL AORTOGRAM W/LOWER EXTREMITY Bilateral 02/08/2023   Procedure: ABDOMINAL AORTOGRAM W/LOWER EXTREMITY;  Surgeon: Philipp Brawn, MD;  Location: [facility] INVASIVE CV LAB;  Service: Vascular;  Laterality: Bilateral;   AMPUTATION Left 03/16/2016   Procedure: AMPUTATION DIGIT LEFT HALLUX;  Surgeon: Charity Conch, DPM;  Location: [facility] OR;  Service: Podiatry;  Laterality: Left;  can start around 5    AMPUTATION Right 02/09/2021   Procedure: AMPUTATION DIGIT;  Surgeon: Marilyn Shropshire, MD;  Location: [facility] OR;  Service: Orthopedics;  Laterality: Right;   AMPUTATION Right 02/04/2023   Procedure: AMPUTATION 4TH AND 5TH METATARSAL;  Surgeon: Evertt Hoe, DPM;  Location: [facility] OR;  Service: Orthopedics/Podiatry;  Laterality: Right;  I&D, partial ray resection, abx beads   AMPUTATION Right 02/09/2023   Procedure: RIGHT BELOW KNEE AMPUTATION;  Surgeon: Timothy Ford, MD;  Location: [facility] OR;  Service: Orthopedics;  Laterality: Right;   AMPUTATION Right 04/06/2023   Procedure: RIGHT ABOVE KNEE AMPUTATION;  Surgeon: Timothy Ford, MD;  Location: [facility] OR;  Service: Orthopedics;  Laterality: Right;   AMPUTATION Left 06/24/2023   Procedure: AMPUTATION, ABOVE KNEE;  Surgeon: Timothy Ford, MD;  Location: [facility] OR;  Service: Orthopedics;  Laterality: Left;   AMPUTATION Right 05/11/2023   Procedure: RIGHT ABOVE KNEE AMPUTATION;  Surgeon: Timothy Ford, MD;  Location: [facility] OR;  Service: Orthopedics;  Laterality: Right;  REVISION RIGHT ABOVE KNEE AMPUTATION   AMPUTATION TOE Right 02/28/2022   Procedure: AMPUTATION TOE;  Surgeon: Jennefer Moats, DPM;  Location: [name] ORS;  Service: Podiatry;  Laterality: Right;  right fifth toe   ARTHROSCOPIC REPAIR ACL  Left    AV FISTULA PLACEMENT Right 05/31/2019   Procedure: Brachiocephalic AV fistula creation;  Surgeon: Celso College, MD;  Location: [name] ORS;  Service: Vascular;  Laterality: Right;   AV FISTULA PLACEMENT Left 08/13/2021   Procedure: ARTERIOVENOUS (AV) FISTULA CREATION (BRACHIALCEPHALIC);  Surgeon: Celso College, MD;  Location: [name] ORS;  Service: Vascular;  Laterality: Left;   CHEST TUBE INSERTION Right 05/06/2023   Procedure: CHEST TUBE INSERTION;  Surgeon: [name] Nigh, MD;  Location: [facility] ENDOSCOPY;  Service: Pulmonary;  Laterality: Right;   COLONOSCOPY WITH PROPOFOL  N/A 10/28/2015   Procedure: COLONOSCOPY WITH PROPOFOL ;  Surgeon: Deveron Fly, MD;  Location: [facility] ENDOSCOPY;  Service: Endoscopy;  Laterality: N/A;   COLONOSCOPY WITH PROPOFOL  N/A 10/29/2015   Procedure: COLONOSCOPY WITH PROPOFOL ;  Surgeon: Deveron Fly, MD;  Location: [facility] ENDOSCOPY;  Service: Endoscopy;  Laterality: N/A;   COLONOSCOPY WITH PROPOFOL  N/A 01/27/2023   Procedure: COLONOSCOPY WITH PROPOFOL ;  Surgeon: Hargis Lias, MD;  Location: [facility] ENDO SUITE;  Service: Endoscopy;  Laterality: N/A;  12:30pm, asa 3   CORONARY ULTRASOUND/IVUS N/A 02/26/2020   Procedure: Intravascular Ultrasound/IVUS;  Surgeon: Lucendia Rusk, MD;  Location: [facility] INVASIVE CV LAB;  Service: Cardiovascular;  Laterality: N/A;   CORONARY/GRAFT ACUTE MI REVASCULARIZATION N/A 02/26/2020   Procedure: Coronary/Graft Acute MI Revascularization;  Surgeon: Lucendia Rusk, MD;  Location: [facility] INVASIVE CV LAB;  Service: Cardiovascular;  Laterality: N/A;   DIALYSIS/PERMA CATHETER INSERTION Right 04/26/2019   Perm Cath    DIALYSIS/PERMA CATHETER INSERTION N/A 04/26/2019   Procedure: DIALYSIS/PERMA CATHETER INSERTION;  Surgeon: Celso College, MD;  Location: [name] INVASIVE CV LAB;  Service: Cardiovascular;  Laterality: N/A;   DIALYSIS/PERMA CATHETER INSERTION N/A 05/25/2021   Procedure: DIALYSIS/PERMA CATHETER INSERTION;  Surgeon: Celso College,  MD;  Location: [name] INVASIVE CV LAB;  Service: Cardiovascular;  Laterality: N/A;   DIALYSIS/PERMA CATHETER INSERTION N/A 08/31/2021   Procedure: DIALYSIS/PERMA CATHETER INSERTION;  Surgeon: Celso College, MD;  Location: [name] INVASIVE CV LAB;  Service: Cardiovascular;  Laterality: N/A;   DIALYSIS/PERMA CATHETER REMOVAL N/A 08/15/2019   Procedure: DIALYSIS/PERMA CATHETER REMOVAL;  Surgeon: Jackquelyn Mass, MD;  Location: [name] INVASIVE CV LAB;  Service: Cardiovascular;  Laterality: N/A;   DIALYSIS/PERMA CATHETER REMOVAL N/A 03/15/2022   Procedure: DIALYSIS/PERMA CATHETER REMOVAL;  Surgeon: Celso College, MD;  Location: [name] INVASIVE CV LAB;  Service: Cardiovascular;  Laterality: N/A;   EMBOLIZATION (CATH LAB) Right 02/06/2021   Procedure: EMBOLIZATION;  Surgeon: Celso College, MD;  Location: [name] INVASIVE CV LAB;  Service: Cardiovascular;  Laterality: Right;  Right Upper Extremity Dialysis Access, Permcath Placement   ESOPHAGOGASTRODUODENOSCOPY (EGD) WITH PROPOFOL  N/A 12/27/2017   Procedure: ESOPHAGOGASTRODUODENOSCOPY (EGD) WITH PROPOFOL ;  Surgeon: Toledo, Alphonsus Jeans, MD;  Location: [name] ENDOSCOPY;  Service: Gastroenterology;  Laterality: N/A;   EYE SURGERY     IR THORACENTESIS ASP PLEURAL SPACE W/IMG GUIDE  05/05/2023   LEFT HEART CATH AND CORONARY ANGIOGRAPHY N/A 02/26/2020   Procedure: LEFT HEART CATH AND CORONARY ANGIOGRAPHY;  Surgeon: Lucendia Rusk, MD;  Location: [facility] INVASIVE CV LAB;  Service: Cardiovascular;  Laterality: N/A;   PROSTATE SURGERY  2016   REVISON OF ARTERIOVENOUS FISTULA Right 07/22/2022   Procedure: RESECTION OF ANEURYSMAL RIGHT ARM ARTERIOVENOUS FISTULA;  Surgeon: Celso College, MD;  Location: [name] ORS;  Service: Vascular;  Laterality: Right;   TONSILECTOMY/ADENOIDECTOMY WITH MYRINGOTOMY     TONSILLECTOMY     UPPER EXTREMITY ANGIOGRAPHY Right 11/26/2020   Procedure: Upper Extremity Angiography;  Surgeon: Celso College, MD;  Location: [name] INVASIVE CV LAB;  Service:  Cardiovascular;  Laterality: Right;   UPPER EXTREMITY ANGIOGRAPHY Right 02/05/2021   Procedure: UPPER EXTREMITY ANGIOGRAPHY;  Surgeon: Celso College, MD;  Location: [name] INVASIVE CV LAB;  Service: Cardiovascular;  Laterality: Right;     HPI  from the history and physical done on the day of admission:   Chief Complaint: Vomiting,   HPI: [name]. is a 53 y.o. male with medical history significant for ESRD, diastolic CHF, diabetes mellitus, hypertension, coronary artery disease, seizures. Patient was brought to the ED from dialysis center reports of feeling ill, generalized weakness.  Patient did not get dialysis today-per patient because he felt ill and he was uncomfortable.  He reports episodes of vomiting over the past couple of days.  No abdominal pain.  No change in bowel habits.  He denies difficulty breathing, no chest pain. Patient's wife is at bedside and assists with the history.  She reports that patient has hemorrhoids, and so he is uncomfortable sitting down for dialysis- as he has to do at [facility] HD. He prefers to lie down for dialysis.  His dialysis schedule is Tuesday Thursday Saturday, he did not complete his last HD-Tuesday 5/27.  And he missed the dialysis before that on Saturday 5/24.  His last full dialysis session was likely at least a week ago. Patient's wife also expresses a lot of frustration about taking care of patient.  This is patient's second hospitalization in the past 6 months.  Last hospitalization 5/21 to 5/23, patient stating he wanted to end his life.  He was discharged to nursing home and subsequently discharged back home a week ago. Spouse reports she had to take time off work.  She teaches.  She is unable to transfer him, even with the lift.  She reports falls.  Also she lives home by 7:30 AM till 430 PM, he does not eat the whole time while she is at work-  because of his gastroparesis he will have resultant diarrhea after eating, and have to lie in his  stool till she gets back.   ED Course:  Temperature 97.7.  Heart rate 50s to 60.  Respiratory rate-??  Accuracy.  Blood pressure 90-188 systolic. WBC 11.8. Chest x-ray clear. Nephrologist Dr. Cindra Cree consulted and had patient dialyzed in the ED.  With persistent weakness, hospitalization requested.   Review of Systems: As per HPI all other systems reviewed and negative.   Hospital Course:     Brief Narrative:  53 y.o. male with medical history significant for ESRD, diastolic CHF, DM2, HTN, coronary artery disease, seizures admitted on 07/21/23 with generalized weakness after missing hemodialysis sessions     -Assessment and Plan: 1)ESRD---TTS - PTA patient missed a couple of hemodialysis sessions - Had hemodialysis session here on admission on 07/21/2023 - Tolerated HD on 07/23/2023 well - Strongly advised to be compliant with outpatient HD on TTS schedule -Wife is trying to look into feasibility of having home HD in near future-due to logistics with transportation ([place])   2)Generalized weakness and Deconditioning--- multifactorial in the setting of missed hemodialysis session bilateral BKA and comorbid conditions - 3)Social/Ethics---  patient's 7th hospitalization in 6 months, in addition to 5 ED visits.  - Patient and his wife are Not willing to give up his Social Security check in order for him to go to long-term care in [place] -Discharge home with home health services at this time - Patient's wife will explore possibilities for in-home HD   4)CAD/ H/o prior STEMI 02/2020 s/p DES to LAD Stable. -Continue aspirin  and atorvastatin  for secondary prevention -Continue Coreg    5)HTN - c/n amlodipine , Coreg , clonidine , hydralazine    6)PAFib - Pt is Not on systemic anticoagulation due to ICH while on Eliquis  in 11-2021. - Coreg  and amiodarone  for rate control   7)Chronic anemia of ESRD - Hgb greater than 9  - Serum iron and iron saturation are low - No bleeding concerns -  IV iron and Epogen  therapy as per nephrology discretion.     8)DM2-- -A1C 5.0 reflecting excellent diabetic control PTA - Avoid being too aggressive with diabetic control due to risk for hypoglycemia   9)History of CVA (cerebrovascular accident) - with ICH while on plavix  and Eliquis  in 11-2021. -Continue aspirin  and atorvastatin  for secondary prevention -continue blood pressure control and risk factor modification   10)History of seizure -Continue Keppra    11)Depression/presumed suicidal thoughts-during last hospitalization.  - During last hospitalization psychiatry service was been consulted and recommendations for outpatient follow-up  - Continue Zoloft  25 mg daily   Disposition: The patient is from: Home              Anticipated d/c is to: Home  Discharge Condition: stable  Follow UP--nephrologist as outpatient   Consults obtained -urology for HD  Diet and Activity recommendation:  As advised  Discharge Instructions    Discharge Instructions     Call MD for:  difficulty breathing, headache or visual disturbances   Complete by: As directed    Call MD for:  persistant dizziness or light-headedness   Complete by: As directed    Call MD for:  persistant nausea and vomiting   Complete by: As directed    Call MD for:  temperature >100.4   Complete by: As directed    Diet - low sodium heart healthy   Complete by: As directed    Diet Carb Modified   Complete by: As directed    Discharge instructions   Complete by: As directed    1)Very Low-salt diet advised---Less than 2 gm of Sodium per day advised----ok to use  Mrs DASH salt substitute instead of Salt 2)Limit your Fluid  intake to no more than 50 ounces (1.5 Liters) per day 3) continue hemodialysis on Tuesdays Thursdays and Saturdays per usual schedule   Discharge wound care:   Complete by: As directed    routine   Increase activity slowly   Complete by: As directed        Discharge Medications     Allergies as  of 07/23/2023       Reactions   Promethazine  Diarrhea, Other (See Comments)   Muscle cramps, cramping "all over"        Medication List     TAKE these medications    (feeding supplement) PROSource Plus liquid Take 30 mLs by mouth 3 (three) times daily between meals.   acetaminophen  325 MG tablet Commonly known as: TYLENOL  Take 2 tablets (650 mg total) by mouth every 6 (six) hours as needed for mild pain (pain score 1-3) or fever (or Fever >/= 101).   amiodarone  200 MG tablet Commonly known as: PACERONE  Take 1 tablet (200 mg total) by mouth daily.   Amlactin Daily 12 % lotion Generic drug: ammonium lactate  Apply topically.   amLODipine  5 MG tablet Commonly known as: NORVASC  Take 1 tablet (5 mg total) by mouth daily.   aspirin  EC 81 MG tablet Take 1 tablet (81 mg total) by mouth daily with breakfast. Swallow whole.   atorvastatin  80 MG tablet Commonly known as: LIPITOR  Take 1 tablet (80 mg total) by mouth at bedtime.   brimonidine  0.2 % ophthalmic solution Commonly known as: ALPHAGAN  Place 1 drop into the right eye 2 (two) times daily.   calcitRIOL  0.25 MCG capsule Commonly known as: ROCALTROL  Take 9 capsules (2.25 mcg total) by mouth every Tuesday, Thursday, and Saturday at 6 PM.   calcium  acetate 667 MG tablet Commonly known as: PHOSLO  Take 1,334 mg by mouth 3 (three) times daily.   carvedilol  12.5 MG tablet Commonly known as: COREG  Take 1 tablet (12.5 mg total) by mouth 2 (two) times daily.   cinacalcet  30 MG tablet Commonly known as: SENSIPAR  Take 2 tablets (60 mg total) by mouth every Tuesday, Thursday, and Saturday at 6 PM.   cloNIDine  0.3 MG tablet Commonly known as: CATAPRES  Take 0.3 mg by mouth every 8 (eight) hours.   Desitin Maximum Strength 40 % Pste Generic drug: Zinc  Oxide Apply 1 Application topically 2 (two) times daily as needed.   dorzolamide -timolol  2-0.5 % ophthalmic solution Commonly known as: COSOPT  Place 1 drop into the right  eye 2 (two) times daily.   hydrALAZINE  25 MG tablet Commonly known as: APRESOLINE  Take 25 mg by mouth 3 (three) times daily.   levETIRAcetam  750 MG tablet Commonly known as: KEPPRA  Take 1 tablet (750 mg total) by mouth 3 (three) times a week. On Tuesday Thursday and Saturday, on dialysis days   levETIRAcetam  500 MG tablet Commonly known as: KEPPRA  Take 1 tablet (500 mg total) by mouth 4 (four) times a week. On Sunday, Monday, Wednesday and Friday, all non-dialysis days   lidocaine -prilocaine  cream Commonly known as: EMLA  Apply 1 Application topically 3 (three) times a week.   multivitamin Tabs tablet Take 1 tablet by mouth at bedtime.   ondansetron  4 MG tablet Commonly known as: ZOFRAN  Take 4 mg by mouth every 8 (eight) hours as needed for nausea or vomiting.   oxyCODONE  5 MG immediate release tablet Commonly known as: Oxy IR/ROXICODONE  Take 1 tablet (5 mg total) by mouth every 8 (eight)  hours as needed for severe pain (pain score 7-10).   pentoxifylline  400 MG CR tablet Commonly known as: TRENTAL  Take 1 tablet (400 mg total) by mouth 3 (three) times daily.   pregabalin  50 MG capsule Commonly known as: LYRICA  Take 1 capsule (50 mg total) by mouth daily.   Pro-Stat AWC Liqd Take 30 mLs by mouth every morning.   sertraline  25 MG tablet Commonly known as: Zoloft  Take 1 tablet (25 mg total) by mouth daily.   sodium zirconium cyclosilicate  10 g Pack packet Commonly known as: LOKELMA  Take 10 g by mouth daily for 10 days.   Zinc  50 MG Tabs Take 1 tablet by mouth daily.               Discharge Care Instructions  (From admission, onward)           Start     Ordered   07/23/23 0000  Discharge wound care:       Comments: routine   07/23/23 1640           Major procedures and Radiology Reports - PLEASE review detailed and final reports for all details, in brief -   DG Chest Portable 1 View Result Date: 07/21/2023 CLINICAL DATA:  Generalized weakness,  skipped dialysis today due to feeling poorly. EXAM: PORTABLE CHEST 1 VIEW COMPARISON:  Portable chest 07/19/2023 at 3:55 a.m. FINDINGS: 7:20 a.m. Stable moderate cardiomegaly. There is a coronary artery stent on the left. The mediastinum is unchanged. There is aortic tortuosity and mild ectasia with atherosclerosis. Linear scarring or atelectasis again is noted in the right base and a small right pleural effusion. The lungs are otherwise clear. No new abnormality. Stable bony thorax. IMPRESSION: 1. No significant change from the previous study. 2. Cardiomegaly. 3. Small right pleural effusion. 4. Aortic atherosclerosis. Electronically Signed   By: Denman Fischer M.D.   On: 07/21/2023 07:40   DG Chest Port 1 View Result Date: 07/19/2023 CLINICAL DATA:  Shortness of breath. EXAM: PORTABLE CHEST 1 VIEW COMPARISON:  Jun 25, 2023 FINDINGS: The cardiac silhouette is enlarged and unchanged in size. Mild linear scarring and/or atelectasis is seen within the right lung base. No acute infiltrate, pleural effusion or pneumothorax is identified. The visualized skeletal structures are unremarkable. IMPRESSION: Cardiomegaly with mild right basilar linear scarring and/or atelectasis. Electronically Signed   By: Virgle Grime M.D.   On: 07/19/2023 04:03   DG CHEST PORT 1 VIEW Result Date: 06/25/2023 CLINICAL DATA:  Fever EXAM: PORTABLE CHEST 1 VIEW COMPARISON:  06/16/2023 FINDINGS: Stable right basilar parenchymal scarring. Persistent right basilar consolidation and small right pleural effusion, possibly representing a parapneumonic effusion. Left lung is clear. No pneumothorax. No pleural effusion on the left. Cardiac size is within normal limits. No acute bone abnormality. IMPRESSION: 1. Persistent right basilar consolidation and small right pleural effusion, possibly representing a parapneumonic effusion. Electronically Signed   By: Worthy Heads M.D.   On: 06/25/2023 11:46   Today   Subjective    Or[place]  Hamme today has no new complaints  - Tolerated hemodialysis well on 07/23/2023 - Oral intake is fair  No fever  Or chills   No Nausea, Vomiting or Diarrhea    Patient has been seen and examined prior to discharge   Objective   Blood pressure 130/69, pulse (!) 55, temperature 98.1 F (36.7 C), temperature source Oral, resp. rate 16, height 6\' 3"  (1.905 m), weight 67.8 kg, SpO2 98%.   Intake/Output Summary (Last 24 hours)  at 07/23/2023 1651 Last data filed at 07/23/2023 1530 Gross per 24 hour  Intake 360 ml  Output 1000 ml  Net -640 ml    Exam Gen:- Awake Alert,  in no apparent distress  HEENT:- [place].AT, No sclera icterus, legally blind Neck-Supple Neck,No JVD,.  Lungs-  CTAB , fair symmetrical air movement CV- S1, S2 normal, regular  Abd-  +ve B.Sounds, Abd Soft, No tenderness,    Extremity--bilateral AKA Psych-affect is appropriate, oriented x3 Neuro-denies weakness, no new focal deficits, no tremors MSK-left arm AV fistula with bruit and thrill   Data Review   CBC w Diff:  Lab Results  Component Value Date   WBC 12.6 (H) 07/23/2023   HGB 8.6 (L) 07/23/2023   HCT 28.8 (L) 07/23/2023   PLT 176 07/23/2023   LYMPHOPCT 12 07/19/2023   BANDSPCT 0 06/23/2015   MONOPCT 7 07/19/2023   EOSPCT 12 07/19/2023   BASOPCT 1 07/19/2023   CMP:  Lab Results  Component Value Date   NA 135 07/23/2023   K 4.2 07/23/2023   CL 95 (L) 07/23/2023   CO2 22 07/23/2023   BUN 78 (H) 07/23/2023   CREATININE 9.55 (H) 07/23/2023   PROT 6.4 (L) 07/13/2023   ALBUMIN  2.2 (L) 07/23/2023   BILITOT 0.7 07/13/2023   ALKPHOS 69 07/13/2023   AST 17 07/13/2023   ALT 30 07/13/2023   Total Discharge time is about 33 minutes  Colin Dawley M.D on 07/23/2023 at 4:51 PM  Go to [email or web address] -  for contact info  [facility] - Office  [phone]

## 2023-07-24 DIAGNOSIS — Z89611 Acquired absence of right leg above knee: Secondary | ICD-10-CM

## 2023-07-24 LAB — GLUCOSE, CAPILLARY
Glucose-Capillary: 110 mg/dL — ABNORMAL HIGH (ref 70–99)
Glucose-Capillary: 96 mg/dL (ref 70–99)
Glucose-Capillary: 98 mg/dL (ref 70–99)

## 2023-07-24 NOTE — Progress Notes (Signed)
 Pt refused HS CGB and insulin . This Clinical research associate let pt know there was not ETA for transport and maybe be in the AM when transport arrives to take him home. Pt cont to refuse. MD aware.

## 2023-07-24 NOTE — Progress Notes (Signed)
 Called [facility] Supervisor on duty to ask for an ETA for convalescent transport for this patient. No answer. Message left and test message left also.

## 2023-07-24 NOTE — Progress Notes (Addendum)
 Patient seen and evaluated, chart reviewed, please see EMR for updated orders. Please see full discharge summary  dictated by this writer for 07/23/23  53 y.o. male with medical history significant for ESRD, diastolic CHF, DM2, HTN, coronary artery disease, seizures admitted on 07/21/23 with generalized weakness after missing hemodialysis sessions .  ESRD---TTS - PTA patient missed a couple of hemodialysis sessions - Had hemodialysis session here on admission on 07/21/2023 - Tolerated HD on 07/23/2023 well - Strongly advised to be compliant with outpatient HD on TTS schedule -Wife is trying to look into feasibility of having home HD in near future-due to logistics with transportation ([facility]il [facility])   - Patient was discharged home on 07/23/2023 however EMS unable to pick patient up - Patient remains medically stable for discharge home  Please see full discharge summary dated 07/23/2023  Allergies as of 07/24/2023       Reactions   Promethazine  Diarrhea, Other (See Comments)   Muscle cramps, cramping "all over"        Medication List     TAKE these medications    (feeding supplement) PROSource Plus liquid Take 30 mLs by mouth 3 (three) times daily between meals.   acetaminophen  325 MG tablet Commonly known as: TYLENOL  Take 2 tablets (650 mg total) by mouth every 6 (six) hours as needed for mild pain (pain score 1-3) or fever (or Fever >/= 101).   amiodarone  200 MG tablet Commonly known as: PACERONE  Take 1 tablet (200 mg total) by mouth daily.   Amlactin Daily 12 % lotion Generic drug: ammonium lactate  Apply topically.   amLODipine  5 MG tablet Commonly known as: NORVASC  Take 1 tablet (5 mg total) by mouth daily.   aspirin  EC 81 MG tablet Take 1 tablet (81 mg total) by mouth daily with breakfast. Swallow whole.   atorvastatin  80 MG tablet Commonly known as: LIPITOR  Take 1 tablet (80 mg total) by mouth at bedtime.   brimonidine  0.2 % ophthalmic solution Commonly known as:  ALPHAGAN  Place 1 drop into the right eye 2 (two) times daily.   calcitRIOL  0.25 MCG capsule Commonly known as: ROCALTROL  Take 9 capsules (2.25 mcg total) by mouth every Tuesday, Thursday, and Saturday at 6 PM.   calcium  acetate 667 MG tablet Commonly known as: PHOSLO  Take 1,334 mg by mouth 3 (three) times daily.   carvedilol  12.5 MG tablet Commonly known as: COREG  Take 1 tablet (12.5 mg total) by mouth 2 (two) times daily.   cinacalcet  30 MG tablet Commonly known as: SENSIPAR  Take 2 tablets (60 mg total) by mouth every Tuesday, Thursday, and Saturday at 6 PM.   cloNIDine  0.3 MG tablet Commonly known as: CATAPRES  Take 0.3 mg by mouth every 8 (eight) hours.   Desitin Maximum Strength 40 % Pste Generic drug: Zinc  Oxide Apply 1 Application topically 2 (two) times daily as needed.   dorzolamide -timolol  2-0.5 % ophthalmic solution Commonly known as: COSOPT  Place 1 drop into the right eye 2 (two) times daily.   hydrALAZINE  25 MG tablet Commonly known as: APRESOLINE  Take 25 mg by mouth 3 (three) times daily.   levETIRAcetam  750 MG tablet Commonly known as: KEPPRA  Take 1 tablet (750 mg total) by mouth 3 (three) times a week. On Tuesday Thursday and Saturday, on dialysis days   levETIRAcetam  500 MG tablet Commonly known as: KEPPRA  Take 1 tablet (500 mg total) by mouth 4 (four) times a week. On Sunday, Monday, Wednesday and Friday, all non-dialysis days   lidocaine -prilocaine  cream Commonly known as:  EMLA  Apply 1 Application topically 3 (three) times a week.   multivitamin Tabs tablet Take 1 tablet by mouth at bedtime.   ondansetron  4 MG tablet Commonly known as: ZOFRAN  Take 4 mg by mouth every 8 (eight) hours as needed for nausea or vomiting.   oxyCODONE  5 MG immediate release tablet Commonly known as: Oxy IR/ROXICODONE  Take 1 tablet (5 mg total) by mouth every 8 (eight) hours as needed for severe pain (pain score 7-10).   pentoxifylline  400 MG CR tablet Commonly  known as: TRENTAL  Take 1 tablet (400 mg total) by mouth 3 (three) times daily.   pregabalin  50 MG capsule Commonly known as: LYRICA  Take 1 capsule (50 mg total) by mouth daily.   Pro-Stat AWC Liqd Take 30 mLs by mouth every morning.   sertraline  25 MG tablet Commonly known as: Zoloft  Take 1 tablet (25 mg total) by mouth daily.   sodium zirconium cyclosilicate  10 g Pack packet Commonly known as: LOKELMA  Take 10 g by mouth daily for 10 days.   Zinc  50 MG Tabs Take 1 tablet by mouth daily.               Discharge Care Instructions  (From admission, onward)           Start     Ordered   07/23/23 0000  Discharge wound care:       Comments: routine   07/23/23 1640           Total care time 16 minutes Colin Dawley, MD

## 2023-07-24 NOTE — Plan of Care (Signed)

## 2023-07-24 NOTE — TOC Progression Note (Signed)
 Transition of Care (TOC) - Progression Note    Patient Details  Name: [name]. MRN: [id number] Date of Birth: [birth year]  Transition of Care [facility]) CM/SW Contact  Katrine Parody, LCSW Phone Number: 07/24/2023, 10:59 AM  Clinical Narrative:    Pt was clear for DC 5/31.  Not picked up. CSW called transportation- pt remains on list, but they are down a vehicle this weekend. No ETA- - but it "could" be today- CSW then called spouse- she asks that RN team call her when ambulance arrives to [facility] so she will be aware he is on route. [employer] to RN and MD to apprise them of request. TOC to continue to follow.     Expected Discharge Plan: Home w Home Health Services Barriers to Discharge: Transportation  Expected Discharge Plan and Services       Living arrangements for the past 2 months: Single Family Home Expected Discharge Date: 07/23/23                 DME Agency: [facility] Date DME Agency Contacted: 07/23/23 Time DME Agency Contacted: 1730 Representative spoke with at DME Agency: [facility] HH Arranged: PT HH Agency: [facility]         Social Determinants of Health (SDOH) Interventions SDOH Screenings   Food Insecurity: No Food Insecurity (07/21/2023)  Housing: Low Risk  (07/21/2023)  Recent Concern: Housing - High Risk (05/22/2023)  Transportation Needs: No Transportation Needs (07/21/2023)  Utilities: Not At Risk (07/21/2023)  Financial Resource Strain: High Risk (07/05/2017)  Physical Activity: Inactive (07/05/2017)  Social Connections: Moderately Integrated (07/21/2023)  Stress: Stress Concern Present (07/05/2017)  Tobacco Use: Low Risk  (07/21/2023)  Health Literacy: Low Risk  (06/01/2020)   Received from [facility]    Readmission Risk Interventions    06/25/2023    9:22 AM 06/21/2023   10:51 AM 05/17/2023    3:44 PM  Readmission Risk Prevention Plan  Transportation Screening Complete Complete Complete  Medication  Review (RN Care Manager) Complete Complete Referral to Pharmacy  PCP or Specialist appointment within 3-5 days of discharge Complete  Complete  HRI or Home Care Consult Complete Complete Complete  SW Recovery Care/Counseling Consult Complete Complete Complete  Palliative Care Screening Not Applicable Not Applicable Not Applicable  Skilled Nursing Facility Complete Not Applicable Not Applicable

## 2023-07-24 NOTE — Plan of Care (Signed)
 Pt refused all meals today, states he is waiting for EMS transport and does not want to have BM. Advised pt that staff will gladly clean him up, pt states, "I will wait to eat when I get home." Advised pt that we do not know when he will be transported and due to his diabetes, he is at a higher risk for low blood sugar. Pt states understanding but still refused his meals.  Problem: Education: Goal: Knowledge of General Education information will improve Description: Including pain rating scale, medication(s)/side effects and non-pharmacologic comfort measures Outcome: Progressing   Problem: Health Behavior/Discharge Planning: Goal: Ability to manage health-related needs will improve Outcome: Progressing   Problem: Clinical Measurements: Goal: Ability to maintain clinical measurements within normal limits will improve Outcome: Progressing Goal: Will remain free from infection Outcome: Progressing Goal: Diagnostic test results will improve Outcome: Progressing Goal: Respiratory complications will improve Outcome: Progressing Goal: Cardiovascular complication will be avoided Outcome: Progressing   Problem: Activity: Goal: Risk for activity intolerance will decrease Outcome: Progressing   Problem: Nutrition: Goal: Adequate nutrition will be maintained Outcome: Progressing   Problem: Coping: Goal: Level of anxiety will decrease Outcome: Progressing   Problem: Elimination: Goal: Will not experience complications related to bowel motility Outcome: Progressing Goal: Will not experience complications related to urinary retention Outcome: Progressing   Problem: Pain Managment: Goal: General experience of comfort will improve and/or be controlled Outcome: Progressing   Problem: Safety: Goal: Ability to remain free from injury will improve Outcome: Progressing   Problem: Skin Integrity: Goal: Risk for impaired skin integrity will decrease Outcome: Progressing   Problem:  Education: Goal: Ability to describe self-care measures that may prevent or decrease complications (Diabetes Survival Skills Education) will improve Outcome: Progressing Goal: Individualized Educational Video(s) Outcome: Progressing   Problem: Coping: Goal: Ability to adjust to condition or change in health will improve Outcome: Progressing   Problem: Fluid Volume: Goal: Ability to maintain a balanced intake and output will improve Outcome: Progressing   Problem: Health Behavior/Discharge Planning: Goal: Ability to identify and utilize available resources and services will improve Outcome: Progressing Goal: Ability to manage health-related needs will improve Outcome: Progressing   Problem: Metabolic: Goal: Ability to maintain appropriate glucose levels will improve Outcome: Progressing   Problem: Nutritional: Goal: Maintenance of adequate nutrition will improve Outcome: Progressing Goal: Progress toward achieving an optimal weight will improve Outcome: Progressing   Problem: Skin Integrity: Goal: Risk for impaired skin integrity will decrease Outcome: Progressing   Problem: Tissue Perfusion: Goal: Adequacy of tissue perfusion will improve Outcome: Progressing

## 2023-07-25 LAB — GLUCOSE, CAPILLARY: Glucose-Capillary: 78 mg/dL (ref 70–99)

## 2023-07-25 NOTE — Progress Notes (Signed)
 Nsg Discharge Note  Admit Date:  07/21/2023 Discharge date: 07/25/2023   [name]. to be D/C'd Home per MD order.  AVS completed.  Copy for chart, and copy for patient signed, and dated. Patient/caregiver able to verbalize understanding.  Discharge Medication: Allergies as of 07/25/2023       Reactions   Promethazine  Diarrhea, Other (See Comments)   Muscle cramps, cramping "all over"        Medication List     TAKE these medications    (feeding supplement) PROSource Plus liquid Take 30 mLs by mouth 3 (three) times daily between meals.   acetaminophen  325 MG tablet Commonly known as: TYLENOL  Take 2 tablets (650 mg total) by mouth every 6 (six) hours as needed for mild pain (pain score 1-3) or fever (or Fever >/= 101).   amiodarone  200 MG tablet Commonly known as: PACERONE  Take 1 tablet (200 mg total) by mouth daily.   Amlactin Daily 12 % lotion Generic drug: ammonium lactate  Apply topically.   amLODipine  5 MG tablet Commonly known as: NORVASC  Take 1 tablet (5 mg total) by mouth daily.   aspirin  EC 81 MG tablet Take 1 tablet (81 mg total) by mouth daily with breakfast. Swallow whole.   atorvastatin  80 MG tablet Commonly known as: LIPITOR  Take 1 tablet (80 mg total) by mouth at bedtime.   brimonidine  0.2 % ophthalmic solution Commonly known as: ALPHAGAN  Place 1 drop into the right eye 2 (two) times daily.   calcitRIOL  0.25 MCG capsule Commonly known as: ROCALTROL  Take 9 capsules (2.25 mcg total) by mouth every Tuesday, Thursday, and Saturday at 6 PM.   calcium  acetate 667 MG tablet Commonly known as: PHOSLO  Take 1,334 mg by mouth 3 (three) times daily.   carvedilol  12.5 MG tablet Commonly known as: COREG  Take 1 tablet (12.5 mg total) by mouth 2 (two) times daily.   cinacalcet  30 MG tablet Commonly known as: SENSIPAR  Take 2 tablets (60 mg total) by mouth every Tuesday, Thursday, and Saturday at 6 PM.   cloNIDine  0.3 MG tablet Commonly known as:  CATAPRES  Take 0.3 mg by mouth every 8 (eight) hours.   Desitin Maximum Strength 40 % Pste Generic drug: Zinc  Oxide Apply 1 Application topically 2 (two) times daily as needed.   dorzolamide -timolol  2-0.5 % ophthalmic solution Commonly known as: COSOPT  Place 1 drop into the right eye 2 (two) times daily.   hydrALAZINE  25 MG tablet Commonly known as: APRESOLINE  Take 25 mg by mouth 3 (three) times daily.   levETIRAcetam  750 MG tablet Commonly known as: KEPPRA  Take 1 tablet (750 mg total) by mouth 3 (three) times a week. On Tuesday Thursday and Saturday, on dialysis days   levETIRAcetam  500 MG tablet Commonly known as: KEPPRA  Take 1 tablet (500 mg total) by mouth 4 (four) times a week. On Sunday, Monday, Wednesday and Friday, all non-dialysis days   lidocaine -prilocaine  cream Commonly known as: EMLA  Apply 1 Application topically 3 (three) times a week.   multivitamin Tabs tablet Take 1 tablet by mouth at bedtime.   ondansetron  4 MG tablet Commonly known as: ZOFRAN  Take 4 mg by mouth every 8 (eight) hours as needed for nausea or vomiting.   oxyCODONE  5 MG immediate release tablet Commonly known as: Oxy IR/ROXICODONE  Take 1 tablet (5 mg total) by mouth every 8 (eight) hours as needed for severe pain (pain score 7-10).   pentoxifylline  400 MG CR tablet Commonly known as: TRENTAL  Take 1 tablet (400 mg total) by mouth  3 (three) times daily.   pregabalin  50 MG capsule Commonly known as: LYRICA  Take 1 capsule (50 mg total) by mouth daily.   Pro-Stat AWC Liqd Take 30 mLs by mouth every morning.   sertraline  25 MG tablet Commonly known as: Zoloft  Take 1 tablet (25 mg total) by mouth daily.   sodium zirconium cyclosilicate  10 g Pack packet Commonly known as: LOKELMA  Take 10 g by mouth daily for 10 days.   Zinc  50 MG Tabs Take 1 tablet by mouth daily.               Discharge Care Instructions  (From admission, onward)           Start     Ordered   07/23/23  0000  Discharge wound care:       Comments: routine   07/23/23 1640            Discharge Assessment: Vitals:   07/25/23 0424 07/25/23 0543  BP: (!) 149/77 (!) 149/77  Pulse: (!) 57   Resp: 16   Temp: 98.3 F (36.8 C)   SpO2: 97%    Skin clean, dry and intact without evidence of skin break down, no evidence of skin tears noted. IV catheter discontinued intact. Site without signs and symptoms of complications - no redness or edema noted at insertion site, patient denies c/o pain - only slight tenderness at site.  Dressing with slight pressure applied.  D/c Instructions-Education: Discharge instructions given to patient/family with verbalized understanding. D/c education completed with patient/family including follow up instructions, medication list, d/c activities limitations if indicated, with other d/c instructions as indicated by MD - patient able to verbalize understanding, all questions fully answered. Patient instructed to return to ED, call 911, or call MD for any changes in condition.  Patient escorted via WC, and D/C home via private auto.  Darrell Else, LPN 02/27/1094 0:45 AM

## 2023-07-25 NOTE — Progress Notes (Addendum)
 07/25/23 Patient seen and evaluated, chart reviewed, please see EMR for updated orders. Please see full discharge summary  dictated by this writer for 07/23/23   53 y.o. male with medical history significant for ESRD, diastolic CHF, DM2, HTN, coronary artery disease, seizures admitted on 07/21/23 with generalized weakness after missing hemodialysis sessions .   ESRD---TTS - PTA patient missed a couple of hemodialysis sessions - Had hemodialysis session here on admission on 07/21/2023 - Tolerated HD on 07/23/2023 well - Strongly advised to be compliant with outpatient HD on TTS schedule -Wife is trying to look into feasibility of having home HD in near future-due to logistics with transportation ([facility])   - Patient was discharged home on 07/23/2023 however EMS unable to pick patient up - Patient was offered opportunity to have hemodialysis today on 07/25/2023 off schedule prior to discharge home today due to concerns about transportation logistical issues for him to get hemodialysis tomorrow 07/26/2023 -Patient said his wife will arrange for transportation for him to go to his outpatient hemodialysis site tomorrow 07/26/2023 so patient refused hemodialysis here today  Patient remains medically stable for discharge home   Please see full discharge summary dated 07/23/2023 - Discussed with nephrologist Dr. Irene Mannheim, nephrology input appreciated  -Total care time 32 minutes  Colin Dawley, MD

## 2023-07-25 NOTE — Plan of Care (Signed)
  Problem: Education: Goal: Knowledge of General Education information will improve Description: Including pain rating scale, medication(s)/side effects and non-pharmacologic comfort measures Outcome: Progressing   Problem: Clinical Measurements: Goal: Ability to maintain clinical measurements within normal limits will improve Outcome: Progressing   Problem: Pain Managment: Goal: General experience of comfort will improve and/or be controlled Outcome: Progressing   Problem: Safety: Goal: Ability to remain free from injury will improve Outcome: Progressing   Problem: Skin Integrity: Goal: Risk for impaired skin integrity will decrease Outcome: Progressing   Problem: Metabolic: Goal: Ability to maintain appropriate glucose levels will improve Outcome: Progressing

## 2023-07-25 NOTE — Progress Notes (Signed)
 Patient ID: [name] Porta., male   DOB: 05-20-1970, 53 y.o.   MRN: [id number] S: Complaining of nausea this morning. O:BP (!) 149/77   Pulse (!) 57   Temp 98.3 F (36.8 C)   Resp 16   Ht 6\' 3"  (1.905 m)   Wt 67.8 kg   SpO2 97%   BMI 18.68 kg/m   Intake/Output Summary (Last 24 hours) at 07/25/2023 0853 Last data filed at 07/25/2023 0600 Gross per 24 hour  Intake 360 ml  Output --  Net 360 ml   Intake/Output: I/O last 3 completed shifts: In: 360 [P.O.:360] Out: -   Intake/Output this shift:  No intake/output data recorded. Weight change:  Gen: Mild distress this morniing.  CVS: bradycardic Resp: CTA Abd: +BS, soft, NT/ND Ext: s/p bilateral AKA's, LUE AVF +t/B  Recent Labs  Lab 07/19/23 0344 07/21/23 0731 07/22/23 0452 07/23/23 1300  NA 136 136 135 135  K 5.2* 4.9 4.0 4.2  CL 99 95* 96* 95*  CO2 18* 19* 23 22  GLUCOSE 93 79 81 102*  BUN 143* 104* 67* 78*  CREATININE 12.66* 11.27* 8.08* 9.55*  ALBUMIN   --   --   --  2.2*  CALCIUM  8.4* 8.6* 8.6* 8.8*  PHOS  --   --  8.8* 8.9*   Liver Function Tests: Recent Labs  Lab 07/23/23 1300  ALBUMIN  2.2*   No results for input(s): "LIPASE", "AMYLASE" in the last 168 hours. No results for input(s): "AMMONIA" in the last 168 hours. CBC: Recent Labs  Lab 07/19/23 0344 07/21/23 0731 07/23/23 1300  WBC 14.4* 13.4* 12.6*  NEUTROABS 9.6*  --   --   HGB 8.7* 9.3* 8.6*  HCT 28.1* 30.2* 28.8*  MCV 75.1* 75.3* 73.7*  PLT 243 203 176   Cardiac Enzymes: No results for input(s): "CKTOTAL", "CKMB", "CKMBINDEX", "TROPONINI" in the last 168 hours. CBG: Recent Labs  Lab 07/23/23 1617 07/24/23 0911 07/24/23 1613 07/24/23 2041 07/25/23 0737  GLUCAP 86 110* 96 98 78    Iron Studies: No results for input(s): "IRON", "TIBC", "TRANSFERRIN", "FERRITIN" in the last 72 hours. Studies/Results: No results found.  amiodarone   200 mg Oral Daily   amLODipine   5 mg Oral Daily   aspirin  EC  81 mg Oral Q breakfast   atorvastatin    80 mg Oral QHS   brimonidine   1 drop Right Eye BID   calcium  acetate  1,334 mg Oral TID WC   carvedilol   12.5 mg Oral BID   Chlorhexidine  Gluconate Cloth  6 each Topical Q0600   cinacalcet   60 mg Oral Q T,Th,Sat-1800   cloNIDine   0.3 mg Oral Q8H   dorzolamide -timolol   1 drop Right Eye BID   doxercalciferol   5 mcg Intravenous Q T,Th,Sa-HD   heparin   5,000 Units Subcutaneous Q8H   hydrALAZINE   25 mg Oral TID   insulin  aspart  0-5 Units Subcutaneous QHS   insulin  aspart  0-9 Units Subcutaneous TID WC   levETIRAcetam   500 mg Oral Once per day on Sunday Monday Wednesday Friday   levETIRAcetam   750 mg Oral Q T,Th,Sa-HD   pentoxifylline   400 mg Oral TID   sertraline   25 mg Oral Daily    BMET    Component Value Date/Time   NA 135 07/23/2023 1300   K 4.2 07/23/2023 1300   CL 95 (L) 07/23/2023 1300   CO2 22 07/23/2023 1300   GLUCOSE 102 (H) 07/23/2023 1300   BUN 78 (H) 07/23/2023 1300   CREATININE 9.55 (  H) 07/23/2023 1300   CALCIUM  8.8 (L) 07/23/2023 1300   GFRNONAA 6 (L) 07/23/2023 1300   GFRAA 5 (L) 08/27/2019 1540   CBC    Component Value Date/Time   WBC 12.6 (H) 07/23/2023 1300   RBC 3.91 (L) 07/23/2023 1300   HGB 8.6 (L) 07/23/2023 1300   HCT 28.8 (L) 07/23/2023 1300   PLT 176 07/23/2023 1300   MCV 73.7 (L) 07/23/2023 1300   MCH 22.0 (L) 07/23/2023 1300   MCHC 29.9 (L) 07/23/2023 1300   RDW 22.9 (H) 07/23/2023 1300   LYMPHSABS 1.8 07/19/2023 0344   MONOABS 1.1 (H) 07/19/2023 0344   EOSABS 1.8 (H) 07/19/2023 0344   BASOSABS 0.1 07/19/2023 0344    Dialysis Orders: [facility] TTS 3 hr  edw 77kg,  LUE AVF   Heparin  1000 bolus + 400u/hr Mircera 100 mcg  q 2wks due  4/28 ( was no show)  No vitamin D    Assessment/Plan: Noncompliance with HD - has not been going to outpatient HD due to transportation issues.  Per primary. Infected wound L foot: hx of R AKA for similar issues. --- S/p L AKA on 5/2  ESRD: on HD TTS.  Plan for HD tomorrow on his outpatient  schedule.  He declined HD today.  Unclear if he will be discharged today or tomorrow after HD.  HTN/Vol: Stable BPs. Stable. No volume on exam. Anemia:  S/p 1 unit PRBCs 5/6. Transfuse 07/06/23 1 U PRBC.  Will resume ESA Secondary hyperparathyroidism: CorrCa elevated -not on Vit D. Phos above goal on Ca acetate binder --follow trends.  Nutrition: optimize as able. Prot supp per RD.  Hyperkalemia: Lokelma  10 mg TID continued for this patient.  Seizure d/o - on keppra  DM2 - per pmd Diarrhea - +C. Diff and norovirus on admit. - s/p po vanc  FTT - poor po intake, sacral wound, was discharged to SNF opn 07/08/23  Disposition - awaiting EMS transportation to SNF  Benjamin Brands, MD [facility] [phone]

## 2023-08-08 ENCOUNTER — Encounter: Admitting: Orthopedic Surgery

## 2023-08-10 ENCOUNTER — Ambulatory Visit (INDEPENDENT_AMBULATORY_CARE_PROVIDER_SITE_OTHER): Admitting: Gastroenterology

## 2023-08-23 DEATH — deceased

## 2023-08-31 ENCOUNTER — Ambulatory Visit: Admitting: Diagnostic Neuroimaging

## 2023-12-07 ENCOUNTER — Encounter (INDEPENDENT_AMBULATORY_CARE_PROVIDER_SITE_OTHER): Payer: Self-pay | Admitting: Gastroenterology

## 2023-12-26 ENCOUNTER — Encounter: Payer: Self-pay | Admitting: Radiology
